# Patient Record
Sex: Male | Born: 1970 | Race: Black or African American | Hispanic: No | Marital: Single | Smoking: Current every day smoker
Health system: Southern US, Community
[De-identification: ages and names within clinical notes are randomized; demographics above are authoritative.]

## PROBLEM LIST (undated history)

## (undated) ENCOUNTER — Emergency Department (HOSPITAL_COMMUNITY): Payer: No Typology Code available for payment source | Source: Home / Self Care

## (undated) VITALS — BP 126/83 | HR 96 | Temp 98.8°F | Resp 16 | Ht 67.5 in | Wt 140.0 lb

## (undated) DIAGNOSIS — F4310 Post-traumatic stress disorder, unspecified: Secondary | ICD-10-CM

## (undated) DIAGNOSIS — G8929 Other chronic pain: Secondary | ICD-10-CM

## (undated) DIAGNOSIS — J189 Pneumonia, unspecified organism: Secondary | ICD-10-CM

## (undated) DIAGNOSIS — I10 Essential (primary) hypertension: Secondary | ICD-10-CM

## (undated) DIAGNOSIS — Z8711 Personal history of peptic ulcer disease: Secondary | ICD-10-CM

## (undated) DIAGNOSIS — K859 Acute pancreatitis without necrosis or infection, unspecified: Secondary | ICD-10-CM

## (undated) DIAGNOSIS — S72413A Displaced unspecified condyle fracture of lower end of unspecified femur, initial encounter for closed fracture: Secondary | ICD-10-CM

## (undated) DIAGNOSIS — F1020 Alcohol dependence, uncomplicated: Secondary | ICD-10-CM

## (undated) DIAGNOSIS — M545 Low back pain, unspecified: Secondary | ICD-10-CM

## (undated) DIAGNOSIS — E7800 Pure hypercholesterolemia, unspecified: Secondary | ICD-10-CM

## (undated) DIAGNOSIS — J42 Unspecified chronic bronchitis: Secondary | ICD-10-CM

## (undated) DIAGNOSIS — Z8719 Personal history of other diseases of the digestive system: Secondary | ICD-10-CM

## (undated) DIAGNOSIS — Z9289 Personal history of other medical treatment: Secondary | ICD-10-CM

## (undated) DIAGNOSIS — I456 Pre-excitation syndrome: Secondary | ICD-10-CM

## (undated) DIAGNOSIS — D649 Anemia, unspecified: Secondary | ICD-10-CM

## (undated) DIAGNOSIS — R569 Unspecified convulsions: Secondary | ICD-10-CM

## (undated) DIAGNOSIS — D573 Sickle-cell trait: Secondary | ICD-10-CM

## (undated) DIAGNOSIS — Z8489 Family history of other specified conditions: Secondary | ICD-10-CM

## (undated) DIAGNOSIS — F1210 Cannabis abuse, uncomplicated: Secondary | ICD-10-CM

## (undated) DIAGNOSIS — IMO0001 Reserved for inherently not codable concepts without codable children: Secondary | ICD-10-CM

## (undated) DIAGNOSIS — R0602 Shortness of breath: Secondary | ICD-10-CM

## (undated) DIAGNOSIS — R011 Cardiac murmur, unspecified: Secondary | ICD-10-CM

## (undated) DIAGNOSIS — F1410 Cocaine abuse, uncomplicated: Secondary | ICD-10-CM

## (undated) DIAGNOSIS — F329 Major depressive disorder, single episode, unspecified: Secondary | ICD-10-CM

## (undated) DIAGNOSIS — F319 Bipolar disorder, unspecified: Secondary | ICD-10-CM

## (undated) DIAGNOSIS — Z915 Personal history of self-harm: Secondary | ICD-10-CM

## (undated) DIAGNOSIS — K861 Other chronic pancreatitis: Secondary | ICD-10-CM

## (undated) DIAGNOSIS — G43909 Migraine, unspecified, not intractable, without status migrainosus: Secondary | ICD-10-CM

## (undated) DIAGNOSIS — F32A Depression, unspecified: Secondary | ICD-10-CM

## (undated) DIAGNOSIS — F419 Anxiety disorder, unspecified: Secondary | ICD-10-CM

## (undated) DIAGNOSIS — M1990 Unspecified osteoarthritis, unspecified site: Secondary | ICD-10-CM

## (undated) DIAGNOSIS — K219 Gastro-esophageal reflux disease without esophagitis: Secondary | ICD-10-CM

## (undated) HISTORY — PX: UMBILICAL HERNIA REPAIR: SHX196

## (undated) HISTORY — PX: UPPER GASTROINTESTINAL ENDOSCOPY: SHX188

## (undated) HISTORY — PX: EYE SURGERY: SHX253

## (undated) HISTORY — PX: HERNIA REPAIR: SHX51

## (undated) HISTORY — PX: CARDIAC CATHETERIZATION: SHX172

## (undated) HISTORY — PX: FRACTURE SURGERY: SHX138

## (undated) HISTORY — PX: FACIAL FRACTURE SURGERY: SHX1570

---

## 1989-01-07 HISTORY — PX: FACIAL FRACTURE SURGERY: SHX1570

## 1989-01-07 HISTORY — PX: EYE SURGERY: SHX253

## 1997-09-07 ENCOUNTER — Emergency Department (HOSPITAL_COMMUNITY): Admission: EM | Admit: 1997-09-07 | Discharge: 1997-09-07 | Payer: Self-pay | Admitting: Emergency Medicine

## 1998-08-18 ENCOUNTER — Encounter: Payer: Self-pay | Admitting: Emergency Medicine

## 1998-08-18 ENCOUNTER — Emergency Department (HOSPITAL_COMMUNITY): Admission: EM | Admit: 1998-08-18 | Discharge: 1998-08-18 | Payer: Self-pay | Admitting: Emergency Medicine

## 1998-11-13 ENCOUNTER — Emergency Department (HOSPITAL_COMMUNITY): Admission: EM | Admit: 1998-11-13 | Discharge: 1998-11-13 | Payer: Self-pay | Admitting: Emergency Medicine

## 1998-11-13 ENCOUNTER — Encounter: Payer: Self-pay | Admitting: Emergency Medicine

## 2000-03-30 ENCOUNTER — Encounter: Payer: Self-pay | Admitting: Emergency Medicine

## 2000-03-30 ENCOUNTER — Emergency Department (HOSPITAL_COMMUNITY): Admission: EM | Admit: 2000-03-30 | Discharge: 2000-03-31 | Payer: Self-pay | Admitting: Emergency Medicine

## 2000-03-31 ENCOUNTER — Encounter: Payer: Self-pay | Admitting: Emergency Medicine

## 2000-06-13 ENCOUNTER — Encounter: Admission: RE | Admit: 2000-06-13 | Discharge: 2000-06-20 | Payer: Self-pay | Admitting: Orthopedic Surgery

## 2000-09-15 ENCOUNTER — Encounter: Payer: Self-pay | Admitting: Emergency Medicine

## 2000-09-15 ENCOUNTER — Emergency Department (HOSPITAL_COMMUNITY): Admission: EM | Admit: 2000-09-15 | Discharge: 2000-09-15 | Payer: Self-pay | Admitting: Emergency Medicine

## 2000-11-03 ENCOUNTER — Emergency Department (HOSPITAL_COMMUNITY): Admission: EM | Admit: 2000-11-03 | Discharge: 2000-11-03 | Payer: Self-pay | Admitting: Emergency Medicine

## 2000-11-03 ENCOUNTER — Encounter: Payer: Self-pay | Admitting: Emergency Medicine

## 2001-12-19 ENCOUNTER — Emergency Department (HOSPITAL_COMMUNITY): Admission: EM | Admit: 2001-12-19 | Discharge: 2001-12-19 | Payer: Self-pay | Admitting: Emergency Medicine

## 2002-10-05 ENCOUNTER — Emergency Department (HOSPITAL_COMMUNITY): Admission: EM | Admit: 2002-10-05 | Discharge: 2002-10-05 | Payer: Self-pay | Admitting: *Deleted

## 2003-05-07 ENCOUNTER — Emergency Department (HOSPITAL_COMMUNITY): Admission: EM | Admit: 2003-05-07 | Discharge: 2003-05-07 | Payer: Self-pay | Admitting: Emergency Medicine

## 2003-08-25 ENCOUNTER — Emergency Department (HOSPITAL_COMMUNITY): Admission: EM | Admit: 2003-08-25 | Discharge: 2003-08-25 | Payer: Self-pay | Admitting: *Deleted

## 2003-10-19 ENCOUNTER — Emergency Department (HOSPITAL_COMMUNITY): Admission: EM | Admit: 2003-10-19 | Discharge: 2003-10-19 | Payer: Self-pay | Admitting: *Deleted

## 2004-03-22 ENCOUNTER — Emergency Department (HOSPITAL_COMMUNITY): Admission: EM | Admit: 2004-03-22 | Discharge: 2004-03-22 | Payer: Self-pay | Admitting: Emergency Medicine

## 2004-03-27 ENCOUNTER — Emergency Department (HOSPITAL_COMMUNITY): Admission: EM | Admit: 2004-03-27 | Discharge: 2004-03-27 | Payer: Self-pay | Admitting: Emergency Medicine

## 2004-10-12 ENCOUNTER — Emergency Department (HOSPITAL_COMMUNITY): Admission: EM | Admit: 2004-10-12 | Discharge: 2004-10-12 | Payer: Self-pay | Admitting: Emergency Medicine

## 2005-02-01 ENCOUNTER — Encounter (INDEPENDENT_AMBULATORY_CARE_PROVIDER_SITE_OTHER): Payer: Self-pay | Admitting: Internal Medicine

## 2005-02-14 ENCOUNTER — Ambulatory Visit: Payer: Self-pay | Admitting: *Deleted

## 2005-05-24 ENCOUNTER — Emergency Department (HOSPITAL_COMMUNITY): Admission: EM | Admit: 2005-05-24 | Discharge: 2005-05-24 | Payer: Self-pay | Admitting: Emergency Medicine

## 2006-02-02 ENCOUNTER — Emergency Department (HOSPITAL_COMMUNITY): Admission: EM | Admit: 2006-02-02 | Discharge: 2006-02-03 | Payer: Self-pay | Admitting: Emergency Medicine

## 2006-02-03 ENCOUNTER — Emergency Department (HOSPITAL_COMMUNITY): Admission: EM | Admit: 2006-02-03 | Discharge: 2006-02-03 | Payer: Self-pay | Admitting: Family Medicine

## 2006-02-12 ENCOUNTER — Emergency Department (HOSPITAL_COMMUNITY): Admission: EM | Admit: 2006-02-12 | Discharge: 2006-02-12 | Payer: Self-pay | Admitting: Family Medicine

## 2006-02-24 ENCOUNTER — Emergency Department (HOSPITAL_COMMUNITY): Admission: EM | Admit: 2006-02-24 | Discharge: 2006-02-24 | Payer: Self-pay | Admitting: Family Medicine

## 2006-05-15 ENCOUNTER — Emergency Department (HOSPITAL_COMMUNITY): Admission: EM | Admit: 2006-05-15 | Discharge: 2006-05-15 | Payer: Self-pay | Admitting: Family Medicine

## 2006-06-07 ENCOUNTER — Emergency Department (HOSPITAL_COMMUNITY): Admission: EM | Admit: 2006-06-07 | Discharge: 2006-06-07 | Payer: Self-pay | Admitting: Family Medicine

## 2006-07-10 ENCOUNTER — Emergency Department (HOSPITAL_COMMUNITY): Admission: EM | Admit: 2006-07-10 | Discharge: 2006-07-10 | Payer: Self-pay | Admitting: Family Medicine

## 2006-07-26 ENCOUNTER — Emergency Department (HOSPITAL_COMMUNITY): Admission: EM | Admit: 2006-07-26 | Discharge: 2006-07-26 | Payer: Self-pay | Admitting: Emergency Medicine

## 2006-07-27 ENCOUNTER — Emergency Department (HOSPITAL_COMMUNITY): Admission: EM | Admit: 2006-07-27 | Discharge: 2006-07-27 | Payer: Self-pay | Admitting: Family Medicine

## 2006-07-27 ENCOUNTER — Emergency Department (HOSPITAL_COMMUNITY): Admission: EM | Admit: 2006-07-27 | Discharge: 2006-07-27 | Payer: Self-pay | Admitting: Emergency Medicine

## 2006-08-07 ENCOUNTER — Emergency Department (HOSPITAL_COMMUNITY): Admission: EM | Admit: 2006-08-07 | Discharge: 2006-08-07 | Payer: Self-pay | Admitting: Family Medicine

## 2006-11-08 ENCOUNTER — Emergency Department (HOSPITAL_COMMUNITY): Admission: EM | Admit: 2006-11-08 | Discharge: 2006-11-08 | Payer: Self-pay | Admitting: Emergency Medicine

## 2007-02-20 ENCOUNTER — Encounter (INDEPENDENT_AMBULATORY_CARE_PROVIDER_SITE_OTHER): Payer: Self-pay | Admitting: Internal Medicine

## 2007-03-13 ENCOUNTER — Encounter (INDEPENDENT_AMBULATORY_CARE_PROVIDER_SITE_OTHER): Payer: Self-pay | Admitting: Internal Medicine

## 2007-03-13 ENCOUNTER — Telehealth (INDEPENDENT_AMBULATORY_CARE_PROVIDER_SITE_OTHER): Payer: Self-pay | Admitting: Internal Medicine

## 2007-03-23 ENCOUNTER — Ambulatory Visit: Payer: Self-pay | Admitting: Internal Medicine

## 2007-03-23 DIAGNOSIS — F1910 Other psychoactive substance abuse, uncomplicated: Secondary | ICD-10-CM

## 2007-03-23 DIAGNOSIS — F1010 Alcohol abuse, uncomplicated: Secondary | ICD-10-CM | POA: Insufficient documentation

## 2007-03-23 DIAGNOSIS — Z720 Tobacco use: Secondary | ICD-10-CM | POA: Insufficient documentation

## 2007-03-23 DIAGNOSIS — F17200 Nicotine dependence, unspecified, uncomplicated: Secondary | ICD-10-CM

## 2007-03-23 LAB — CONVERTED CEMR LAB
Bilirubin Urine: NEGATIVE
Glucose, Urine, Semiquant: NEGATIVE
Ketones, urine, test strip: NEGATIVE
Specific Gravity, Urine: 1.01
Urobilinogen, UA: NEGATIVE
pH: 8

## 2007-03-26 ENCOUNTER — Encounter (INDEPENDENT_AMBULATORY_CARE_PROVIDER_SITE_OTHER): Payer: Self-pay | Admitting: Internal Medicine

## 2007-03-26 LAB — CONVERTED CEMR LAB
Albumin: 4 g/dL (ref 3.5–5.2)
Alkaline Phosphatase: 73 units/L (ref 39–117)
BUN: 9 mg/dL (ref 6–23)
Calcium: 8.8 mg/dL (ref 8.4–10.5)
Chloride: 104 meq/L (ref 96–112)
Eosinophils Absolute: 0.2 10*3/uL (ref 0.2–0.7)
Glucose, Bld: 70 mg/dL (ref 70–99)
HDL: 75 mg/dL (ref >39)
LDL Cholesterol: 30 mg/dL (ref 0–99)
Lymphocytes Relative: 33 % (ref 12–46)
Lymphs Abs: 3.8 10*3/uL (ref 0.7–4.0)
Monocytes Relative: 7 % (ref 3–12)
Neutro Abs: 6.8 10*3/uL (ref 1.7–7.7)
Neutrophils Relative %: 59 % (ref 43–77)
Platelets: 251 10*3/uL (ref 150–400)
Potassium: 4.1 meq/L (ref 3.5–5.3)
RBC: 3.97 M/uL — ABNORMAL LOW (ref 4.22–5.81)
Sodium: 141 meq/L (ref 135–145)
Total Protein: 6.6 g/dL (ref 6.0–8.3)
Triglycerides: 319 mg/dL — ABNORMAL HIGH (ref <150)
WBC: 11.6 10*3/uL — ABNORMAL HIGH (ref 4.0–10.5)

## 2007-03-28 ENCOUNTER — Encounter (INDEPENDENT_AMBULATORY_CARE_PROVIDER_SITE_OTHER): Payer: Self-pay | Admitting: Internal Medicine

## 2007-03-28 LAB — CONVERTED CEMR LAB
Hepatitis B Surface Ag: NEGATIVE
Iron: 23 ug/dL — ABNORMAL LOW (ref 42–165)
Saturation Ratios: 6 % — ABNORMAL LOW (ref 20–55)
TIBC: 372 ug/dL (ref 215–435)

## 2007-04-04 IMAGING — CT CT HEAD W/O CM
1 of 2 series · 13 of 30 positions shown, 17 images · non-contrast
Comparison: None.
 HEAD CT WITHOUT CONTRAST:

CLINICAL DATA: Left-sided headache for two days.
TECHNIQUE: 5mm collimated images were obtained from the base of the skull through the vertex according to standard protocol without contrast.
 There is no evidence of intracranial hemorrhage, brain edema, or mass effect.  No other intra-axial abnormalities are seen, and the ventricles are within normal limits.  No abnormal extra-axial fluid collections or masses are identified.  No skull abnormalities are noted.

[Series 2: brain · axial · 0.47mm/px · z∈[+151,+282]mm · 13 of 32 slices shown, 17 images]
[im 3/32  brain]
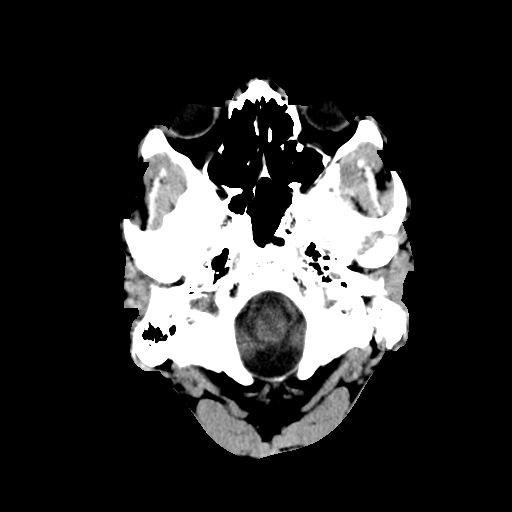
[im 3/32  bone]
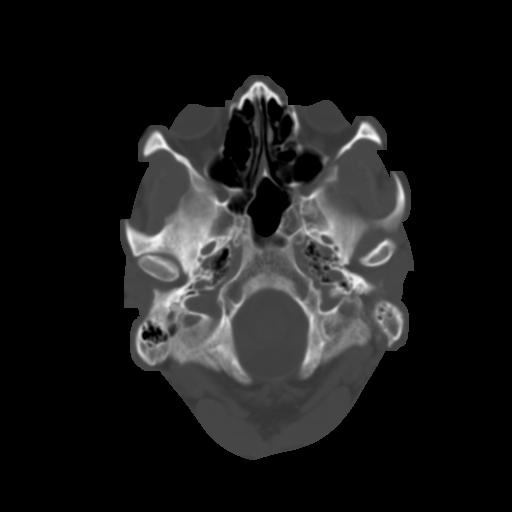
[im 5/32  brain]
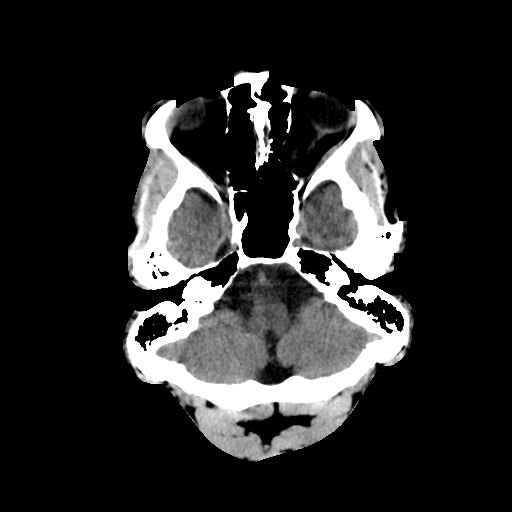
[im 7/32  brain]
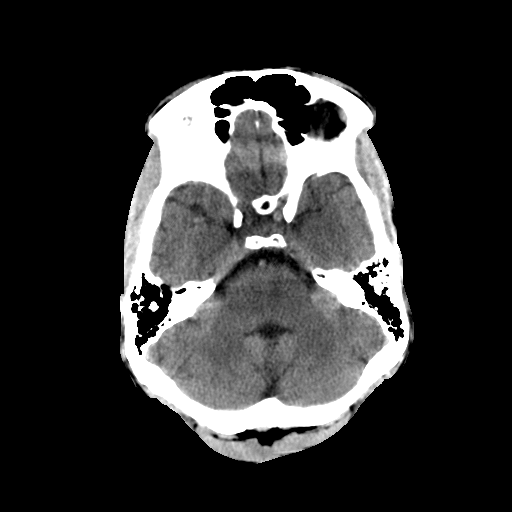
[im 9/32  brain]
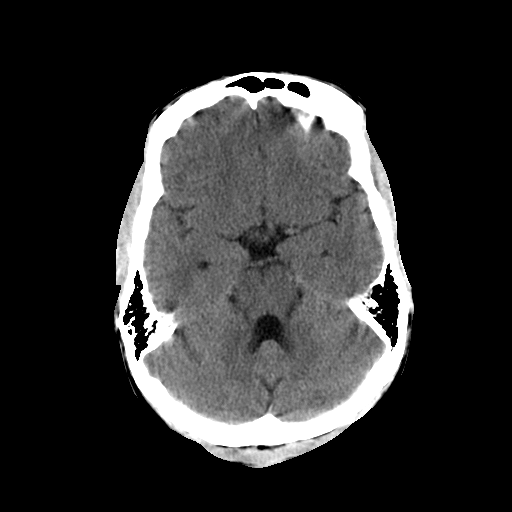
[im 12/32  brain]
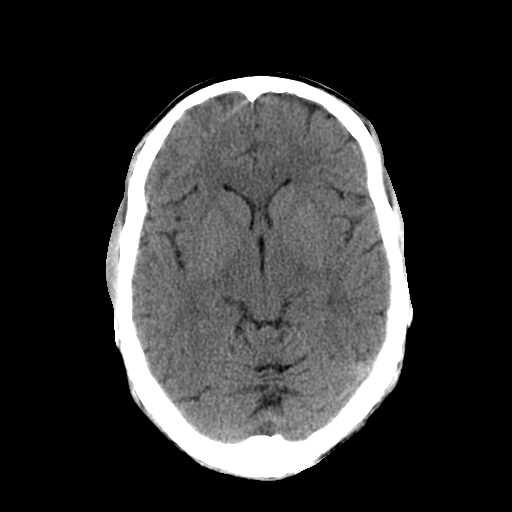
[im 12/32  bone]
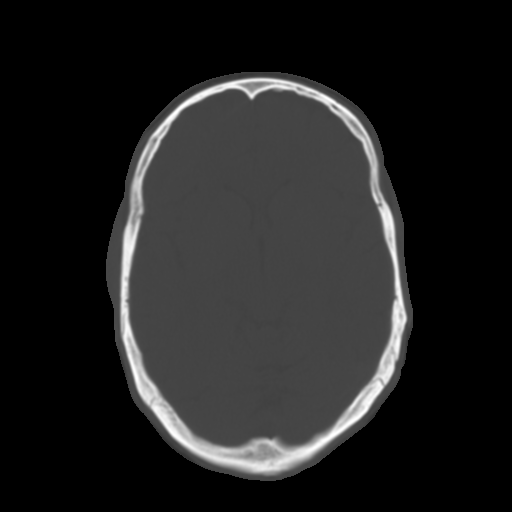
[im 14/32  brain]
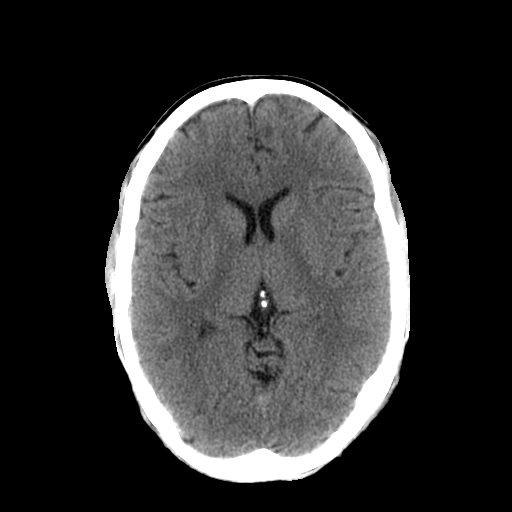
[im 16/32  brain]
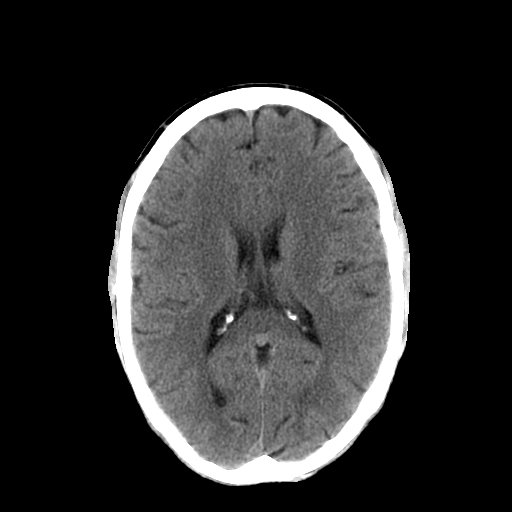
[im 18/32  brain]
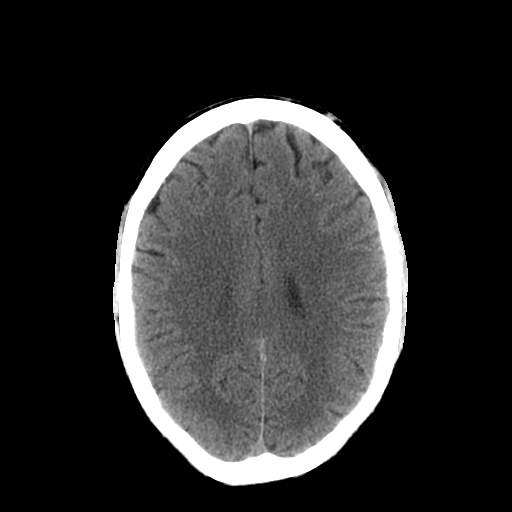
[im 20/32  brain]
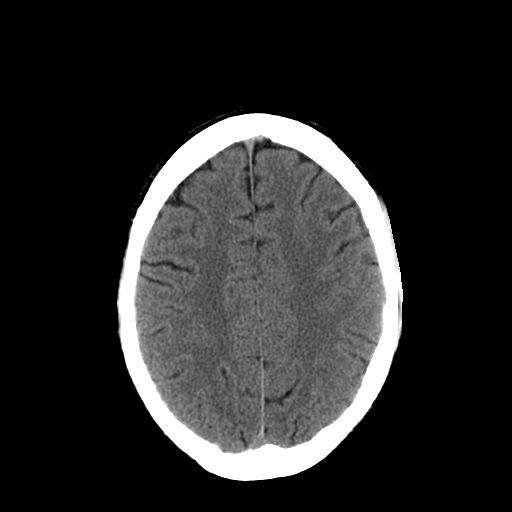
[im 20/32  bone]
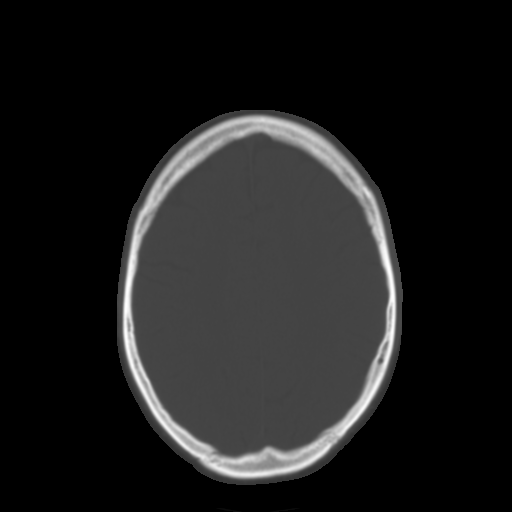
[im 23/32  brain]
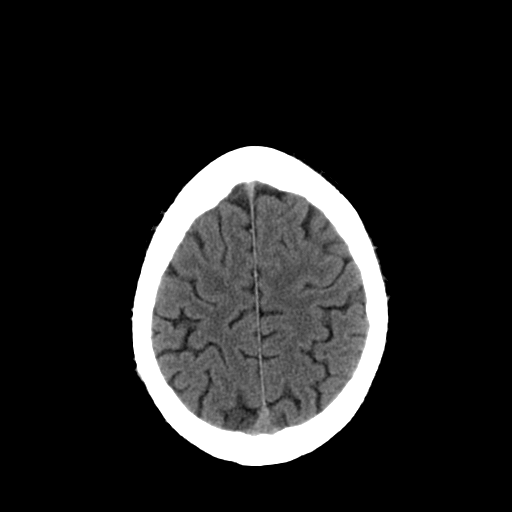
[im 25/32  brain]
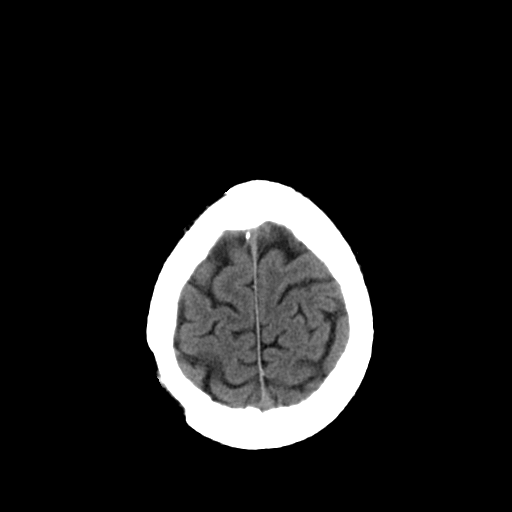
[im 27/32  brain]
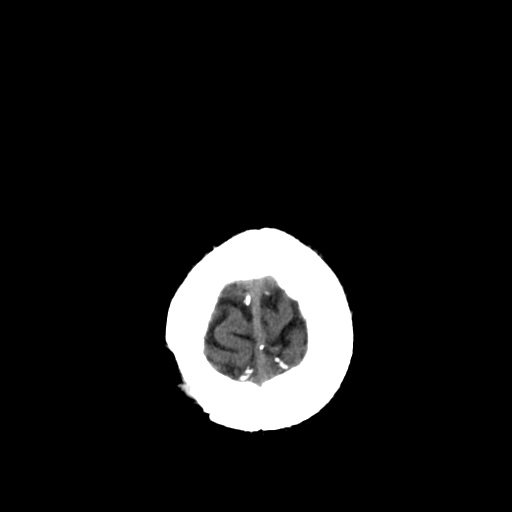
[im 29/32  brain]
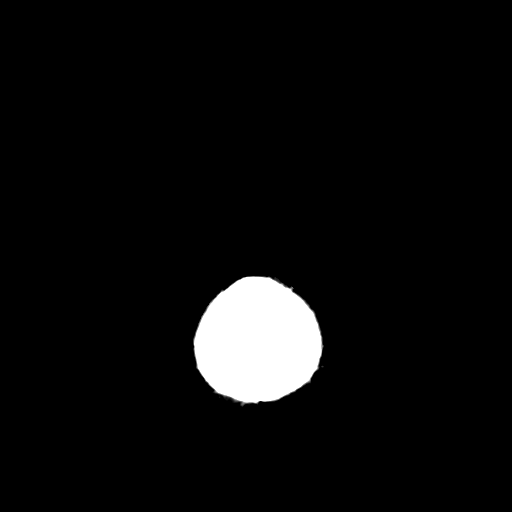
[im 29/32  bone]
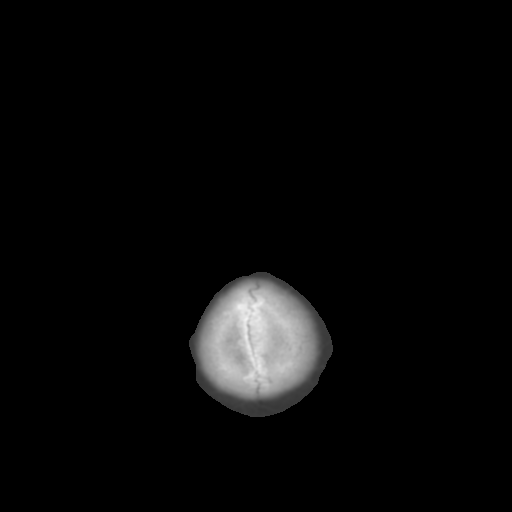

[13 of 30 positions shown; findings below may reference images not displayed]

IMPRESSION: Negative non-contrast head CT.

## 2007-04-24 ENCOUNTER — Ambulatory Visit: Payer: Self-pay | Admitting: Internal Medicine

## 2007-06-25 ENCOUNTER — Telehealth (INDEPENDENT_AMBULATORY_CARE_PROVIDER_SITE_OTHER): Payer: Self-pay | Admitting: Internal Medicine

## 2007-07-27 ENCOUNTER — Ambulatory Visit: Payer: Self-pay | Admitting: Internal Medicine

## 2007-09-04 ENCOUNTER — Emergency Department (HOSPITAL_COMMUNITY): Admission: EM | Admit: 2007-09-04 | Discharge: 2007-09-04 | Payer: Self-pay | Admitting: Emergency Medicine

## 2008-02-02 ENCOUNTER — Observation Stay (HOSPITAL_COMMUNITY): Admission: EM | Admit: 2008-02-02 | Discharge: 2008-02-04 | Payer: Self-pay | Admitting: Emergency Medicine

## 2008-03-06 ENCOUNTER — Ambulatory Visit: Payer: Self-pay | Admitting: Internal Medicine

## 2008-05-21 ENCOUNTER — Telehealth (INDEPENDENT_AMBULATORY_CARE_PROVIDER_SITE_OTHER): Payer: Self-pay | Admitting: Internal Medicine
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
## 2008-07-30 ENCOUNTER — Emergency Department (HOSPITAL_BASED_OUTPATIENT_CLINIC_OR_DEPARTMENT_OTHER): Admission: EM | Admit: 2008-07-30 | Discharge: 2008-07-30 | Payer: Self-pay | Admitting: Emergency Medicine

## 2008-08-13 ENCOUNTER — Emergency Department (HOSPITAL_BASED_OUTPATIENT_CLINIC_OR_DEPARTMENT_OTHER): Admission: EM | Admit: 2008-08-13 | Discharge: 2008-08-13 | Payer: Self-pay | Admitting: Emergency Medicine

## 2008-08-13 ENCOUNTER — Ambulatory Visit: Payer: Self-pay | Admitting: Diagnostic Radiology

## 2008-08-14 ENCOUNTER — Ambulatory Visit: Payer: Self-pay | Admitting: Diagnostic Radiology

## 2008-08-14 ENCOUNTER — Emergency Department (HOSPITAL_BASED_OUTPATIENT_CLINIC_OR_DEPARTMENT_OTHER): Admission: EM | Admit: 2008-08-14 | Discharge: 2008-08-14 | Payer: Self-pay | Admitting: Emergency Medicine

## 2008-11-09 ENCOUNTER — Emergency Department (HOSPITAL_COMMUNITY): Admission: EM | Admit: 2008-11-09 | Discharge: 2008-11-09 | Payer: Self-pay | Admitting: Emergency Medicine

## 2008-12-02 ENCOUNTER — Emergency Department (HOSPITAL_COMMUNITY): Admission: EM | Admit: 2008-12-02 | Discharge: 2008-12-02 | Payer: Self-pay | Admitting: Family Medicine

## 2009-03-03 ENCOUNTER — Emergency Department (HOSPITAL_COMMUNITY): Admission: EM | Admit: 2009-03-03 | Discharge: 2009-03-04 | Payer: Self-pay | Admitting: Emergency Medicine

## 2009-03-07 ENCOUNTER — Emergency Department (HOSPITAL_COMMUNITY): Admission: EM | Admit: 2009-03-07 | Discharge: 2009-03-07 | Payer: Self-pay | Admitting: Emergency Medicine

## 2009-03-12 ENCOUNTER — Emergency Department (HOSPITAL_COMMUNITY): Admission: EM | Admit: 2009-03-12 | Discharge: 2009-03-12 | Payer: Self-pay | Admitting: Emergency Medicine

## 2009-05-04 ENCOUNTER — Inpatient Hospital Stay (HOSPITAL_COMMUNITY): Admission: EM | Admit: 2009-05-04 | Discharge: 2009-05-10 | Payer: Self-pay | Admitting: Emergency Medicine

## 2009-05-13 ENCOUNTER — Emergency Department (HOSPITAL_COMMUNITY): Admission: EM | Admit: 2009-05-13 | Discharge: 2009-05-13 | Payer: Self-pay | Admitting: Emergency Medicine

## 2009-05-20 ENCOUNTER — Emergency Department (HOSPITAL_COMMUNITY): Admission: EM | Admit: 2009-05-20 | Discharge: 2009-05-20 | Payer: Self-pay | Admitting: Emergency Medicine

## 2009-05-28 ENCOUNTER — Ambulatory Visit: Payer: Self-pay | Admitting: Internal Medicine

## 2009-05-28 DIAGNOSIS — F329 Major depressive disorder, single episode, unspecified: Secondary | ICD-10-CM

## 2009-06-05 ENCOUNTER — Telehealth (INDEPENDENT_AMBULATORY_CARE_PROVIDER_SITE_OTHER): Payer: Self-pay | Admitting: Internal Medicine

## 2009-07-01 ENCOUNTER — Emergency Department (HOSPITAL_COMMUNITY): Admission: EM | Admit: 2009-07-01 | Discharge: 2009-07-01 | Payer: Self-pay | Admitting: Emergency Medicine

## 2009-07-09 ENCOUNTER — Emergency Department (HOSPITAL_COMMUNITY): Admission: EM | Admit: 2009-07-09 | Discharge: 2009-07-09 | Payer: Self-pay | Admitting: Emergency Medicine

## 2009-10-15 ENCOUNTER — Emergency Department (HOSPITAL_COMMUNITY): Admission: EM | Admit: 2009-10-15 | Discharge: 2009-10-15 | Payer: Self-pay | Admitting: Emergency Medicine

## 2010-04-22 ENCOUNTER — Emergency Department (HOSPITAL_COMMUNITY)
Admission: EM | Admit: 2010-04-22 | Discharge: 2010-04-22 | Payer: Self-pay | Source: Home / Self Care | Admitting: Emergency Medicine

## 2010-04-24 ENCOUNTER — Emergency Department (HOSPITAL_COMMUNITY)
Admission: EM | Admit: 2010-04-24 | Discharge: 2010-04-24 | Payer: Self-pay | Source: Home / Self Care | Admitting: Emergency Medicine

## 2010-04-24 LAB — CONVERTED CEMR LAB
BUN: 11 mg/dL
Glucose, Bld: 91 mg/dL
Sodium: 142 meq/L

## 2010-04-25 ENCOUNTER — Emergency Department (HOSPITAL_COMMUNITY)
Admission: EM | Admit: 2010-04-25 | Discharge: 2010-04-25 | Payer: Self-pay | Source: Home / Self Care | Admitting: Emergency Medicine

## 2010-04-26 ENCOUNTER — Emergency Department (HOSPITAL_COMMUNITY)
Admission: EM | Admit: 2010-04-26 | Discharge: 2010-04-26 | Payer: Self-pay | Source: Home / Self Care | Admitting: Emergency Medicine

## 2010-04-30 ENCOUNTER — Ambulatory Visit: Payer: Self-pay | Admitting: Nurse Practitioner

## 2010-04-30 ENCOUNTER — Encounter (INDEPENDENT_AMBULATORY_CARE_PROVIDER_SITE_OTHER): Payer: Self-pay | Admitting: Internal Medicine

## 2010-04-30 LAB — CONVERTED CEMR LAB
Bilirubin Urine: NEGATIVE
Urobilinogen, UA: 0.2

## 2010-05-05 ENCOUNTER — Telehealth (INDEPENDENT_AMBULATORY_CARE_PROVIDER_SITE_OTHER): Payer: Self-pay | Admitting: Nurse Practitioner

## 2010-05-05 ENCOUNTER — Ambulatory Visit (HOSPITAL_COMMUNITY)
Admission: RE | Admit: 2010-05-05 | Discharge: 2010-05-05 | Payer: Self-pay | Source: Home / Self Care | Attending: Internal Medicine | Admitting: Internal Medicine

## 2010-05-07 ENCOUNTER — Telehealth (INDEPENDENT_AMBULATORY_CARE_PROVIDER_SITE_OTHER): Payer: Self-pay | Admitting: Internal Medicine

## 2010-05-20 ENCOUNTER — Emergency Department (HOSPITAL_COMMUNITY)
Admission: EM | Admit: 2010-05-20 | Discharge: 2010-05-20 | Payer: Self-pay | Source: Home / Self Care | Admitting: Emergency Medicine

## 2010-05-24 LAB — CBC
HCT: 40.3 % (ref 39.0–52.0)
Hemoglobin: 13.8 g/dL (ref 13.0–17.0)
MCH: 32.6 pg (ref 26.0–34.0)
MCHC: 34.2 g/dL (ref 30.0–36.0)
MCV: 95.3 fL (ref 78.0–100.0)
Platelets: 200 K/uL (ref 150–400)
RBC: 4.23 MIL/uL (ref 4.22–5.81)
RDW: 12 % (ref 11.5–15.5)
WBC: 11.1 K/uL — ABNORMAL HIGH (ref 4.0–10.5)

## 2010-05-24 LAB — DIFFERENTIAL
Basophils Absolute: 0 K/uL (ref 0.0–0.1)
Basophils Relative: 0 % (ref 0–1)
Eosinophils Absolute: 0.4 K/uL (ref 0.0–0.7)
Eosinophils Relative: 4 % (ref 0–5)
Lymphocytes Relative: 40 % (ref 12–46)
Lymphs Abs: 4.5 K/uL — ABNORMAL HIGH (ref 0.7–4.0)
Monocytes Absolute: 1.2 K/uL — ABNORMAL HIGH (ref 0.1–1.0)
Monocytes Relative: 10 % (ref 3–12)
Neutro Abs: 5.1 K/uL (ref 1.7–7.7)
Neutrophils Relative %: 46 % (ref 43–77)

## 2010-05-24 LAB — COMPREHENSIVE METABOLIC PANEL WITH GFR
ALT: 105 U/L — ABNORMAL HIGH (ref 0–53)
AST: 159 U/L — ABNORMAL HIGH (ref 0–37)
Albumin: 4.2 g/dL (ref 3.5–5.2)
Alkaline Phosphatase: 84 U/L (ref 39–117)
BUN: 10 mg/dL (ref 6–23)
CO2: 23 meq/L (ref 19–32)
Calcium: 9.8 mg/dL (ref 8.4–10.5)
Chloride: 101 meq/L (ref 96–112)
Creatinine, Ser: 0.89 mg/dL (ref 0.4–1.5)
GFR calc non Af Amer: >60 mL/min
Glucose, Bld: 97 mg/dL (ref 70–99)
Potassium: 4.2 meq/L (ref 3.5–5.1)
Sodium: 135 meq/L (ref 135–145)
Total Bilirubin: 1.2 mg/dL (ref 0.3–1.2)
Total Protein: 7.2 g/dL (ref 6.0–8.3)

## 2010-05-24 LAB — LIPASE, BLOOD: Lipase: 40 U/L (ref 11–59)

## 2010-05-24 LAB — ETHANOL: Alcohol, Ethyl (B): <5 mg/dL (ref 0–10)

## 2010-05-31 ENCOUNTER — Emergency Department (HOSPITAL_COMMUNITY)
Admission: EM | Admit: 2010-05-31 | Discharge: 2010-05-31 | Payer: Self-pay | Source: Home / Self Care | Admitting: Emergency Medicine

## 2010-06-01 LAB — DIFFERENTIAL
Basophils Relative: 0 % (ref 0–1)
Eosinophils Absolute: 0.2 10*3/uL (ref 0.0–0.7)
Lymphs Abs: 2.5 10*3/uL (ref 0.7–4.0)
Monocytes Absolute: 0.7 10*3/uL (ref 0.1–1.0)
Monocytes Relative: 11 % (ref 3–12)
Neutro Abs: 3.3 10*3/uL (ref 1.7–7.7)

## 2010-06-01 LAB — URINALYSIS, ROUTINE W REFLEX MICROSCOPIC
Bilirubin Urine: NEGATIVE
Hgb urine dipstick: NEGATIVE
Protein, ur: NEGATIVE mg/dL
Urine Glucose, Fasting: NEGATIVE mg/dL
Urobilinogen, UA: 0.2 mg/dL (ref 0.0–1.0)

## 2010-06-01 LAB — COMPREHENSIVE METABOLIC PANEL
AST: 96 U/L — ABNORMAL HIGH (ref 0–37)
BUN: 10 mg/dL (ref 6–23)
CO2: 26 mEq/L (ref 19–32)
Calcium: 9.9 mg/dL (ref 8.4–10.5)
Creatinine, Ser: 0.74 mg/dL (ref 0.4–1.5)
GFR calc Af Amer: >60 mL/min (ref >60)
GFR calc non Af Amer: >60 mL/min (ref >60)
Total Bilirubin: 0.7 mg/dL (ref 0.3–1.2)

## 2010-06-01 LAB — CBC
Hemoglobin: 13.1 g/dL (ref 13.0–17.0)
MCH: 31.2 pg (ref 26.0–34.0)
MCHC: 32.4 g/dL (ref 30.0–36.0)
MCV: 96.2 fL (ref 78.0–100.0)

## 2010-06-01 LAB — LIPASE, BLOOD: Lipase: 38 U/L (ref 11–59)

## 2010-06-03 ENCOUNTER — Ambulatory Visit: Admit: 2010-06-03 | Payer: Self-pay | Admitting: Internal Medicine

## 2010-06-06 ENCOUNTER — Emergency Department (HOSPITAL_COMMUNITY)
Admission: EM | Admit: 2010-06-06 | Discharge: 2010-06-06 | Payer: Self-pay | Source: Home / Self Care | Admitting: Emergency Medicine

## 2010-06-06 LAB — URINALYSIS, ROUTINE W REFLEX MICROSCOPIC
Bilirubin Urine: NEGATIVE
Nitrite: NEGATIVE
Specific Gravity, Urine: 1.029 (ref 1.005–1.030)
Urobilinogen, UA: 0.2 mg/dL (ref 0.0–1.0)

## 2010-06-06 LAB — COMPREHENSIVE METABOLIC PANEL
ALT: 70 U/L — ABNORMAL HIGH (ref 0–53)
BUN: 8 mg/dL (ref 6–23)
CO2: 28 mEq/L (ref 19–32)
Calcium: 9.6 mg/dL (ref 8.4–10.5)
Creatinine, Ser: 0.81 mg/dL (ref 0.4–1.5)
GFR calc non Af Amer: >60 mL/min (ref >60)
Glucose, Bld: 79 mg/dL (ref 70–99)

## 2010-06-06 LAB — CBC
HCT: 36.3 % — ABNORMAL LOW (ref 39.0–52.0)
Hemoglobin: 12.3 g/dL — ABNORMAL LOW (ref 13.0–17.0)
MCH: 32.3 pg (ref 26.0–34.0)
MCHC: 33.9 g/dL (ref 30.0–36.0)
MCV: 95.3 fL (ref 78.0–100.0)
RDW: 12.3 % (ref 11.5–15.5)

## 2010-06-06 LAB — LIPASE, BLOOD: Lipase: 27 U/L (ref 11–59)

## 2010-06-06 LAB — DIFFERENTIAL
Eosinophils Relative: 4 % (ref 0–5)
Lymphocytes Relative: 46 % (ref 12–46)
Monocytes Absolute: 0.6 10*3/uL (ref 0.1–1.0)
Monocytes Relative: 8 % (ref 3–12)
Neutro Abs: 3.4 10*3/uL (ref 1.7–7.7)

## 2010-06-10 NOTE — Progress Notes (Signed)
Summary: TEST RESULTS  Phone Note Call from Patient   Summary of Call: PLEASE CALL PATIENT NEED TEST RESULT [phone] Initial call taken by: Domenic Polite,  May 07, 2010 8:57 AM  Follow-up for Phone Call        LM on [phone]... Hale Drone CMA  May 07, 2010 4:50 PM

## 2010-06-10 NOTE — Progress Notes (Signed)
Summary: Ultrasound results  Phone Note Outgoing Call   Summary of Call: notify pt that abdominal ultrasound is normal Initial call taken by: Lehman Prom FNP,  May 05, 2010 4:28 PM  Follow-up for Phone Call        LM on [phone]... Hale Drone CMA  May 07, 2010 4:51 PM   Pt. called... notified him of the above info.... Hale Drone CMA  May 11, 2010 12:45 PM

## 2010-06-10 NOTE — Assessment & Plan Note (Signed)
Summary: PRANCRITITS////KT   Vital Signs:  Patient profile:   40 year old male Weight:      140.8 pounds BMI:     21.49 Temp:     98.0 degrees F oral Pulse rate:   93 / minute Pulse rhythm:   regular Resp:     20 per minute BP sitting:   118 / 76  (left arm) Cuff size:   regular  Vitals Entered By: Mikey College CMA (May 28, 2009 10:45 AM) CC: pt states here for f/u hospital Pain Assessment Patient in pain? yes     Location: stomach Intensity: 8  Does patient need assistance? Functional Status Self care Ambulation Normal Comments lisinopril 10mg  1 day metropolol 25mg  twice day percocet pain phenergan gemfribozil   CC:  pt states here for f/u hospital.  History of Present Illness: Pt. has not been seen since 2009  1.  Pancreatitis:  Hospitalized 2 days after Christmas with recurrent acute episode.  No pseudocyst or other abnormality noted on CT of abdomen.  Pt. admits to drinking beer again prior to this episode.  Pt. was initiated on Gemfibrozil for a triglyceride of 181 while in hospital.  Discussed this was unlikely to be cause of pancreatitis. Did go through 6 months of inpatient rehab starting in March [facility].  Has managed to stay off cocaine, but not alcohol.  Does have a sponsor, [employer].  Has had a resurgence in pain and nausea since discharge--needs something for those.  Discussed pain meds would be short term--no long term pain meds.  2.Depression:  Missed appt. with [facility] on day started working at [employer].  Did call and try to cancel, but he states he was told he would not be able to return.  Since then, has been able to get reinstated--January 24th.  We filled psych meds.  Pt. was not aware and has not picked up.  3.  Hypertension:  Diagnosed in Rehab--but pt. states he was not taking anything by time of discharge.  Allergies: No Known Drug Allergies  Physical Exam  General:  NAD Lungs:  Scattered exp wheeze, good air  movement Heart:  Normal rate and regular rhythm. S1 and S2 normal without gallop, murmur, click, rub or other extra sounds.  Radial pulses normal and equal Abdomen:  Bowel sounds positive,abdomen soft and non-tender without masses, organomegaly or hernias noted.   Impression & Recommendations:  Problem # 1:  DEPRESSION (ICD-311) Part of problem with alcohol use His updated medication list for this problem includes:    Fluoxetine Hcl 10 Mg Caps (Fluoxetine hcl) .[name]... 1 tab by mouth every am.  Problem # 2:  PANCREATITIS, ALCOHOLIC (ICD-577.0) Short term pain control only. Encouraged to stop drinking--to use his counselors through [employer] as well.  Problem # 3:  ASTHMA (ICD-493.90) Back on meds His updated medication list for this problem includes:    Albuterol 90 Mcg/act Aers (Albuterol)    Advair Diskus 100-50 Mcg/dose Misc (Fluticasone-salmeterol) .[name]... 1 inhalation two times a day  Problem # 4:  ALLERGIC RHINITIS (ICD-477.9) Back on meds His updated medication list for this problem includes:    Fexofenadine Hcl 180 Mg Tabs (Fexofenadine hcl) .[name]... 1 tab by mouth daily    Flonase 50 Mcg/act Susp (Fluticasone propionate) .[name]... 2 sprays each nostril daily    Promethazine Hcl 25 Mg Tabs (Promethazine hcl) .[name]... 1 tab by mouth every 6 hours as needed for nausea.  Problem # 5:  HYPERTENSION (ICD-401.9)  His updated  medication list for this problem includes:    Lisinopril 10 Mg Tabs (Lisinopril) .[name]... 1 tab by mouth daily  Complete Medication List: 1)  Seroquel 25 Mg Tabs (Quetiapine fumarate) .[name].. 1 tab by mouth qhs 2)  Fluoxetine Hcl 10 Mg Caps (Fluoxetine hcl) .[name].. 1 tab by mouth every am. 3)  Albuterol 90 Mcg/act Aers (Albuterol) 4)  Advair Diskus 100-50 Mcg/dose Misc (Fluticasone-salmeterol) .[name].. 1 inhalation two times a day 5)  Fexofenadine Hcl 180 Mg Tabs (Fexofenadine hcl) .[name].. 1 tab by mouth daily 6)  Flonase 50 Mcg/act Susp (Fluticasone propionate) .... 2 sprays each nostril  daily 7)  Protonix 40 Mg Tbec (Pantoprazole sodium) .[name].. 1 tab by mouth daily 8)  Lisinopril 10 Mg Tabs (Lisinopril) .[name].. 1 tab by mouth daily 9)  Promethazine Hcl 25 Mg Tabs (Promethazine hcl) .[name].. 1 tab by mouth every 6 hours as needed for nausea. 10)  Oxycodone-acetaminophen 5-500 Mg Caps (Oxycodone-acetaminophen) .[name].. 1 cap by mouth every 6 hours as needed for pain  Patient Instructions: 1)  Referral to [facility]--cocaine and alcohol abuse, depression 2)  Follow up with Dr. Delrae Alfred in 3 months   Asthma, htn, pancreatitis 3)  Stop Gemfibrozil Prescriptions: OXYCODONE-ACETAMINOPHEN 5-500 MG CAPS (OXYCODONE-ACETAMINOPHEN) 1 cap by mouth every 6 hours as needed for pain  #20 x 0   Entered and Authorized by:   Julieanne Manson MD   Signed by:   Julieanne Manson MD on 05/28/2009   Method used:   Print then Give to Patient   RxID:   [id number] FLONASE 50 MCG/ACT  SUSP (FLUTICASONE PROPIONATE) 2 sprays each nostril daily  #1 x 6   Entered and Authorized by:   Julieanne Manson MD   Signed by:   Julieanne Manson MD on 05/28/2009   Method used:   Faxed to ...       [facility] (retail)       [address] [place], [place]  [address]       Ph: [phone]       Fax: [phone]   RxID:   [id number] FEXOFENADINE HCL 180 MG  TABS (FEXOFENADINE HCL) 1 tab by mouth daily  #30 x 6   Entered and Authorized by:   Julieanne Manson MD   Signed by:   Julieanne Manson MD on 05/28/2009   Method used:   Faxed to ...       [facility] (retail)       [address] [place], [place]  [address]       Ph: [phone]       Fax: [phone]   RxID:   [id number] ADVAIR DISKUS 100-50 MCG/DOSE  MISC (FLUTICASONE-SALMETEROL) 1 inhalation two times a day  #1 x 6   Entered and Authorized by:   Julieanne Manson MD   Signed by:   Julieanne Manson MD on 05/28/2009   Method used:    Faxed to ...       [facility] - [facility] (retail)       [address] [name], [place]  [address]       Ph: [phone]       Fax: [phone]   RxID:   [id number] PROMETHAZINE HCL 25 MG TABS (PROMETHAZINE HCL) 1 tab by mouth every 6 hours as needed for nausea.  #20 x 0   Entered and  Authorized by:   Julieanne Manson MD   Signed by:   Julieanne Manson MD on 05/28/2009   Method used:   Faxed to ...       [facility] - [facility] (retail)       [address] [place], [place]  [address]       Ph: [phone]       Fax: [phone]   RxID:   [id number] LISINOPRIL 10 MG TABS (LISINOPRIL) 1 tab by mouth daily  #30 x 6   Entered and Authorized by:   Julieanne Manson MD   Signed by:   Julieanne Manson MD on 05/28/2009   Method used:   Faxed to ...       [facility] - [facility] (retail)       [address] [place], [place]  [address]       Ph: [phone]       Fax: [phone]   RxID:   [id number]

## 2010-06-10 NOTE — Letter (Signed)
Summary: TEST ORDER FORM//ULTRASOUND//APPT DATE & TIME  TEST ORDER FORM//ULTRASOUND//APPT DATE & TIME   Imported By: Arta Bruce 05/11/2010 15:33:57  _____________________________________________________________________  External Attachment:    Type:   Image     Comment:   External Document

## 2010-06-10 NOTE — Progress Notes (Signed)
Summary: Oxycodone  Phone Note Call from Patient Call back at [facility] Phone [phone]   Summary of Call: The pt needs more refills from his oxycodone medication because he only has one left.  Pt still has pain in his abdominal area.  [facility] [address] [phone]) Delrae Alfred Md Initial call taken by: Manon Hilding,  June 05, 2009 10:35 AM  Follow-up for Phone Call        Last got #20 on 05/28/09. Follow-up by: Vesta Mixer CMA,  June 05, 2009 11:26 AM  Additional Follow-up for Phone Call Additional follow up Details #1::        We discussed that the pain meds would only be a short term thing.  No refill Additional Follow-up by: Julieanne Manson MD,  June 05, 2009 5:24 PM    Additional Follow-up for Phone Call Additional follow up Details #2::    Pt aware, however he is asking can he get something not as strong then.  He said the pain is better, just having occasional pain off and on now, so would still like something, but it does not have to be the same med. Follow-up by: Vesta Mixer CMA,  June 08, 2009 8:59 AM  Additional Follow-up for Phone Call Additional follow up Details #3:: Details for Additional Follow-up Action Taken: Recommend Tylenol 1000 mg by mouth two times a day   Julieanne Manson MD  June 08, 2009 2:10 PM   Spoke with older sounding lady who yelled for the pt then hung up on me.  When I called back it went to the answering machince so I left a msg for pt to call................ Vesta Mixer CMA  June 08, 2009 4:06 PM   Pt aware Additional Follow-up by: Vesta Mixer CMA,  June 09, 2009 9:14 AM

## 2010-06-10 NOTE — Assessment & Plan Note (Signed)
Summary: HTN/Abdominal pain   Vital Signs:  Patient profile:   40 year old male Height:      67 inches Weight:      142.7 pounds BMI:     22.43 Temp:     98.0 degrees F oral Pulse rate:   100 / minute Pulse rhythm:   regular Resp:     20 per minute BP sitting:   114 / 84  (left arm) Cuff size:   regular  Vitals Entered By: Levon Hedger (April 30, 2010 10:34 AM) CC: [facility] urgent care visit...muscles spasms, Hypertension Management Is Patient Diabetic? No Pain Assessment Patient in pain? yes     Location: legs, pancreas  Does patient need assistance? Functional Status Self care Ambulation Normal   CC:  [facility] urgent care visit...muscles spasms and Hypertension Management.  History of Present Illness:  Pt into the office for f/u on ER visit He has been to the ER on December 17, 18 and 19.  December 17th -  Muscle aches pt was started on ibuprofen 400mg  and valium as needed   December 18th - Muscle aches electrolytes normal today during exam  December 19 - Pt was given vicodin as needed and advised to continue to take valium   Hx Pancreatitis - week long hospitalization the early part of 2011. Reports since that time he has quit drinking since 11/2009. Reports that he really started drinking at age 18 and he quit at age 46. Pt has concerns that he has some pancreatic cancer.    Social - pt is not employed at this time.   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  Hypertension History:      He denies headache, chest pain, and palpitations.  He notes no problems with any antihypertensive medication side effects.  pt was changed to clonidine during his multiple ER visits.  he has been taking it and actually been taking some of his family's meds.        Positive major cardiovascular risk factors include hypertension and current tobacco user.  Negative major cardiovascular risk factors include male age less than 47 years old and no  history of diabetes or hyperlipidemia.        Further assessment for target organ damage reveals no history of ASHD, cardiac end-organ damage (CHF/LVH), stroke/TIA, peripheral vascular disease, renal insufficiency, or hypertensive retinopathy.     Habits & Providers  Alcohol-Tobacco-Diet     Alcohol drinks/day: 2     Alcohol Counseling: to decrease amount and/or frequency of alcohol intake     Alcohol type: beer     Tobacco Status: current     Tobacco Counseling: to quit use of tobacco products     Cigarette Packs/Day: 1/2      Year Started: 16 years  Exercise-Depression-Behavior     Does Patient Exercise: yes     Type of exercise: walks     Drug Use: yes     Seat Belt Use: 100  Comments: Pt reports that he quit drinking ETOH in   Allergies (verified): No Known Drug Allergies  Review of Systems General:  Denies fever. CV:  Denies chest pain or discomfort. Resp:  Denies cough. GI:  Complains of abdominal pain; denies nausea and vomiting.  Physical Exam  General:  alert.   Head:  normocephalic.   Lungs:  normal breath sounds.   Heart:  normal rate and regular rhythm.   Abdomen:  right upper quad tenderness  Msk:  up to the exam table right wrist - ganglion, circumscribed, palpable lesion Neurologic:  alert & oriented X3.   Skin:  color normal.   Psych:  Oriented X3.     Impression & Recommendations:  Problem # 1:  HYPERTENSION (ICD-401.9) DASH diet  will refill meds The following medications were removed from the medication list:    Lisinopril 10 Mg Tabs (Lisinopril) .[name]... 1 tab by mouth daily His updated medication list for this problem includes:    Clonidine Hcl 0.1 Mg Tabs (Clonidine hcl) ..... One tablet by mouth two times a day for blood pressure  Problem # 2:  GANGLION CYST (ICD-727.43) seen today advised pt that if this is asymptomatic then no need for intervention and will monitor  Problem # 3:  BACK PAIN (ICD-724.5) Will start anti-inflammatories  and muscle relaxers heat pad The following medications were removed from the medication list:    Oxycodone-acetaminophen 5-500 Mg Caps (Oxycodone-acetaminophen) .[name]... 1 cap by mouth every 6 hours as needed for pain His updated medication list for this problem includes:    Naproxen 500 Mg Tabs (Naproxen) ..... One tablet by mouth two times a day for back pain    Cyclobenzaprine Hcl 10 Mg Tabs (Cyclobenzaprine hcl) ..... One tablet by mouth nightly as needed  for spasms  Orders: UA Dipstick w/o Micro (manual) ([address])  Problem # 4:  ABDOMINAL PAIN (ICD-789.00) will order abdominal u/s  Complete Medication List: 1)  Seroquel 25 Mg Tabs (Quetiapine fumarate) .[name].. 1 tab by mouth qhs 2)  Fluoxetine Hcl 10 Mg Caps (Fluoxetine hcl) .[name].. 1 tab by mouth every am. 3)  Ventolin Hfa 108 (90 Base) Mcg/act Aers (Albuterol sulfate) .... 2 puffs by mouth two times a day as needed for shortness of breath 4)  Advair Diskus 100-50 Mcg/dose Misc (Fluticasone-salmeterol) .[name].. 1 inhalation two times a day 5)  Clonidine Hcl 0.1 Mg Tabs (Clonidine hcl) .... One tablet by mouth two times a day for blood pressure 6)  Naproxen 500 Mg Tabs (Naproxen) .... One tablet by mouth two times a day for back pain 7)  Cyclobenzaprine Hcl 10 Mg Tabs (Cyclobenzaprine hcl) .... One tablet by mouth nightly as needed  for spasms  Other Orders: Ultrasound (Ultrasound)  Hypertension Assessment/Plan:      The patient's hypertensive risk group is category B: At least one risk factor (excluding diabetes) with no target organ damage.  His calculated 10 year risk of coronary heart disease is 3 %.  Today's blood pressure is 114/84.  His blood pressure goal is < 140/90.  Patient Instructions: 1)  Follow up with Dr. Delrae Alfred for other chronic issues. 2)  Discuss Ganglion cyct and Pysch medications 3)  You will be scheduled for an abdominal ultrasound. 4)  Your advair and inhalers have been sent to [facility] 5)  Back pain -  Take Anti-inflammatory - naprosyn two times a day as needed with food then may take muscle relaxer as needed  Prescriptions: CYCLOBENZAPRINE HCL 10 MG TABS (CYCLOBENZAPRINE HCL) One tablet by mouth nightly as needed  for spasms  #20 x 0   Entered and Authorized by:   Lehman Prom FNP   Signed by:   Lehman Prom FNP on 04/30/2010   Method used:   Print then Give to Patient   RxID:   [id number] NAPROXEN 500 MG TABS (NAPROXEN) One tablet by mouth two times a day for back pain  #50 x 0   Entered and Authorized by:   Zena Amos  Daphine Deutscher FNP   Signed by:   Lehman Prom FNP on 04/30/2010   Method used:   Print then Give to Patient   RxID:   [id number] ADVAIR DISKUS 100-50 MCG/DOSE  MISC (FLUTICASONE-SALMETEROL) 1 inhalation two times a day  #1 x 5   Entered and Authorized by:   Lehman Prom FNP   Signed by:   Lehman Prom FNP on 04/30/2010   Method used:   Faxed to ...       [facility] (retail)       [address] [place], [place]  [address]       Ph: [phone]       Fax: [phone]   RxID:   [id number] VENTOLIN HFA 108 (90 BASE) MCG/ACT AERS (ALBUTEROL SULFATE) 2 puffs by mouth two times a day as needed for shortness of breath  #1 x 5   Entered and Authorized by:   Lehman Prom FNP   Signed by:   Lehman Prom FNP on 04/30/2010   Method used:   Faxed to ...       [facility] (retail)       [address] [place], [place]  [address]       Ph: [phone]       Fax: [phone]   RxID:   [id number] ADVAIR DISKUS 100-50 MCG/DOSE  MISC (FLUTICASONE-SALMETEROL) 1 inhalation two times a day  #1 x 6   Entered and Authorized by:   Lehman Prom FNP   Signed by:   Lehman Prom FNP on 04/30/2010   Method used:   Print then Give to Patient   RxID:   [id number] CLONIDINE HCL 0.1 MG TABS (CLONIDINE HCL) One tablet by mouth two times a day for blood  pressure  #60 x 3   Entered and Authorized by:   Lehman Prom FNP   Signed by:   Lehman Prom FNP on 04/30/2010   Method used:   Print then Give to Patient   RxID:   [id number]    Orders Added: 1)  Est. Patient Level III [99833] 2)  Ultrasound [Ultrasound] 3)  UA Dipstick w/o Micro (manual) [81002]    Laboratory Results   Urine Tests  Date/Time Received: April 30, 2010 12:03 PM   Routine Urinalysis   Color: lt. yellow Appearance: Clear Glucose: negative   (Normal Range: Negative) Bilirubin: negative   (Normal Range: Negative) Ketone: negative   (Normal Range: Negative) Spec. Gravity: <1.005   (Normal Range: 1.003-1.035) Blood: negative   (Normal Range: Negative) pH: 6.0   (Normal Range: 5.0-8.0) Protein: negative   (Normal Range: Negative) Urobilinogen: 0.2   (Normal Range: 0-1) Nitrite: negative   (Normal Range: Negative) Leukocyte Esterace: negative   (Normal Range: Negative)

## 2010-06-15 ENCOUNTER — Emergency Department (HOSPITAL_COMMUNITY)
Admission: EM | Admit: 2010-06-15 | Discharge: 2010-06-15 | Disposition: A | Discharge status: Home / Self Care | Payer: Self-pay | Attending: Emergency Medicine | Admitting: Emergency Medicine

## 2010-06-15 DIAGNOSIS — J45909 Unspecified asthma, uncomplicated: Secondary | ICD-10-CM | POA: Insufficient documentation

## 2010-06-15 DIAGNOSIS — R1013 Epigastric pain: Secondary | ICD-10-CM | POA: Insufficient documentation

## 2010-06-15 DIAGNOSIS — K861 Other chronic pancreatitis: Secondary | ICD-10-CM | POA: Insufficient documentation

## 2010-06-15 DIAGNOSIS — I10 Essential (primary) hypertension: Secondary | ICD-10-CM | POA: Insufficient documentation

## 2010-06-15 DIAGNOSIS — M549 Dorsalgia, unspecified: Secondary | ICD-10-CM | POA: Insufficient documentation

## 2010-06-15 LAB — URINALYSIS, ROUTINE W REFLEX MICROSCOPIC
Protein, ur: NEGATIVE mg/dL
Urine Glucose, Fasting: NEGATIVE mg/dL
Urobilinogen, UA: 0.2 mg/dL (ref 0.0–1.0)

## 2010-06-15 LAB — BASIC METABOLIC PANEL
Chloride: 106 mEq/L (ref 96–112)
Creatinine, Ser: 0.82 mg/dL (ref 0.4–1.5)
GFR calc Af Amer: >60 mL/min (ref >60)
Potassium: 3.9 mEq/L (ref 3.5–5.1)

## 2010-06-15 LAB — DIFFERENTIAL
Basophils Relative: 0 % (ref 0–1)
Eosinophils Absolute: 0.2 10*3/uL (ref 0.0–0.7)
Neutrophils Relative %: 55 % (ref 43–77)

## 2010-06-15 LAB — RAPID URINE DRUG SCREEN, HOSP PERFORMED
Barbiturates: NOT DETECTED
Benzodiazepines: NOT DETECTED
Cocaine: NOT DETECTED

## 2010-06-15 LAB — CBC
Platelets: 211 10*3/uL (ref 150–400)
RBC: 3.77 MIL/uL — ABNORMAL LOW (ref 4.22–5.81)
WBC: 8.7 10*3/uL (ref 4.0–10.5)

## 2010-06-15 LAB — LIPASE, BLOOD: Lipase: 127 U/L — ABNORMAL HIGH (ref 11–59)

## 2010-06-16 ENCOUNTER — Emergency Department (HOSPITAL_COMMUNITY)
Admission: EM | Admit: 2010-06-16 | Discharge: 2010-06-16 | Disposition: A | Discharge status: Home / Self Care | Payer: Self-pay | Attending: Emergency Medicine | Admitting: Emergency Medicine

## 2010-06-16 DIAGNOSIS — R110 Nausea: Secondary | ICD-10-CM | POA: Insufficient documentation

## 2010-06-16 DIAGNOSIS — R109 Unspecified abdominal pain: Secondary | ICD-10-CM | POA: Insufficient documentation

## 2010-06-16 DIAGNOSIS — R945 Abnormal results of liver function studies: Secondary | ICD-10-CM | POA: Insufficient documentation

## 2010-06-16 DIAGNOSIS — K861 Other chronic pancreatitis: Secondary | ICD-10-CM | POA: Insufficient documentation

## 2010-06-16 LAB — CBC
Hemoglobin: 12.6 g/dL — ABNORMAL LOW (ref 13.0–17.0)
MCH: 32.3 pg (ref 26.0–34.0)
MCHC: 33.7 g/dL (ref 30.0–36.0)
MCV: 95.9 fL (ref 78.0–100.0)

## 2010-06-16 LAB — DIFFERENTIAL
Basophils Relative: 0 % (ref 0–1)
Monocytes Absolute: 0.6 10*3/uL (ref 0.1–1.0)
Monocytes Relative: 8 % (ref 3–12)
Neutro Abs: 3.6 10*3/uL (ref 1.7–7.7)

## 2010-06-16 LAB — COMPREHENSIVE METABOLIC PANEL
ALT: 184 U/L — ABNORMAL HIGH (ref 0–53)
CO2: 27 mEq/L (ref 19–32)
Calcium: 9.1 mg/dL (ref 8.4–10.5)
Creatinine, Ser: 0.88 mg/dL (ref 0.4–1.5)
GFR calc non Af Amer: >60 mL/min (ref >60)
Glucose, Bld: 87 mg/dL (ref 70–99)
Sodium: 137 mEq/L (ref 135–145)

## 2010-06-16 LAB — LIPASE, BLOOD: Lipase: 31 U/L (ref 11–59)

## 2010-07-19 LAB — POCT I-STAT, CHEM 8
BUN: 8 mg/dL (ref 6–23)
Calcium, Ion: 1.23 mmol/L (ref 1.12–1.32)
Chloride: 105 mEq/L (ref 96–112)
Chloride: 107 mEq/L (ref 96–112)
Creatinine, Ser: 0.8 mg/dL (ref 0.4–1.5)
Glucose, Bld: 88 mg/dL (ref 70–99)
HCT: 41 % (ref 39.0–52.0)
HCT: 40 % (ref 39.0–52.0)
Potassium: 3.8 mEq/L (ref 3.5–5.1)
Potassium: 4 mEq/L (ref 3.5–5.1)
Sodium: 139 mEq/L (ref 135–145)

## 2010-07-19 LAB — BASIC METABOLIC PANEL
BUN: 11 mg/dL (ref 6–23)
Chloride: 109 mEq/L (ref 96–112)
Glucose, Bld: 91 mg/dL (ref 70–99)
Potassium: 3.8 mEq/L (ref 3.5–5.1)
Sodium: 142 mEq/L (ref 135–145)

## 2010-07-19 LAB — DIFFERENTIAL
Lymphocytes Relative: 28 % (ref 12–46)
Monocytes Absolute: 0.9 10*3/uL (ref 0.1–1.0)
Monocytes Relative: 9 % (ref 3–12)
Neutro Abs: 5.7 10*3/uL (ref 1.7–7.7)

## 2010-07-19 LAB — CBC
MCH: 34.1 pg — ABNORMAL HIGH (ref 26.0–34.0)
MCHC: 33.8 g/dL (ref 30.0–36.0)
MCV: 100.9 fL — ABNORMAL HIGH (ref 78.0–100.0)
Platelets: 270 10*3/uL (ref 150–400)
RDW: 14.1 % (ref 11.5–15.5)
WBC: 9.7 10*3/uL (ref 4.0–10.5)

## 2010-07-19 LAB — URINALYSIS, ROUTINE W REFLEX MICROSCOPIC
Bilirubin Urine: NEGATIVE
Ketones, ur: NEGATIVE mg/dL
Nitrite: NEGATIVE
Specific Gravity, Urine: 1.014 (ref 1.005–1.030)
Urobilinogen, UA: 0.2 mg/dL (ref 0.0–1.0)

## 2010-07-19 LAB — CK: Total CK: 299 U/L — ABNORMAL HIGH (ref 7–232)

## 2010-07-19 LAB — COMPREHENSIVE METABOLIC PANEL
Albumin: 3.8 g/dL (ref 3.5–5.2)
BUN: 16 mg/dL (ref 6–23)
Calcium: 9.1 mg/dL (ref 8.4–10.5)
Creatinine, Ser: 0.75 mg/dL (ref 0.4–1.5)
Total Protein: 6.9 g/dL (ref 6.0–8.3)

## 2010-07-24 LAB — DIFFERENTIAL
Basophils Absolute: 0 10*3/uL (ref 0.0–0.1)
Basophils Absolute: 0.1 10*3/uL (ref 0.0–0.1)
Basophils Relative: 0 % (ref 0–1)
Basophils Relative: 1 % (ref 0–1)
Eosinophils Absolute: 0.3 10*3/uL (ref 0.0–0.7)
Eosinophils Absolute: 0.3 10*3/uL (ref 0.0–0.7)
Eosinophils Relative: 3 % (ref 0–5)
Lymphocytes Relative: 36 % (ref 12–46)
Monocytes Absolute: 1 10*3/uL (ref 0.1–1.0)
Neutrophils Relative %: 46 % (ref 43–77)
Neutrophils Relative %: 67 % (ref 43–77)

## 2010-07-24 LAB — CBC
HCT: 33.9 % — ABNORMAL LOW (ref 39.0–52.0)
Hemoglobin: 11 g/dL — ABNORMAL LOW (ref 13.0–17.0)
Hemoglobin: 11.3 g/dL — ABNORMAL LOW (ref 13.0–17.0)
MCV: 100.1 fL — ABNORMAL HIGH (ref 78.0–100.0)
RBC: 3.34 MIL/uL — ABNORMAL LOW (ref 4.22–5.81)
RBC: 3.41 MIL/uL — ABNORMAL LOW (ref 4.22–5.81)
RDW: 13.5 % (ref 11.5–15.5)
WBC: 9.8 10*3/uL (ref 4.0–10.5)

## 2010-07-24 LAB — COMPREHENSIVE METABOLIC PANEL
ALT: 10 U/L (ref 0–53)
ALT: 16 U/L (ref 0–53)
AST: 20 U/L (ref 0–37)
Alkaline Phosphatase: 80 U/L (ref 39–117)
BUN: <1 mg/dL — ABNORMAL LOW (ref 6–23)
CO2: 26 mEq/L (ref 19–32)
CO2: 26 mEq/L (ref 19–32)
Chloride: 102 mEq/L (ref 96–112)
Chloride: 109 mEq/L (ref 96–112)
GFR calc Af Amer: >60 mL/min (ref >60)
GFR calc non Af Amer: >60 mL/min (ref >60)
GFR calc non Af Amer: >60 mL/min (ref >60)
Glucose, Bld: 85 mg/dL (ref 70–99)
Glucose, Bld: 90 mg/dL (ref 70–99)
Potassium: 4.6 mEq/L (ref 3.5–5.1)
Sodium: 138 mEq/L (ref 135–145)
Sodium: 140 mEq/L (ref 135–145)
Total Bilirubin: 0.5 mg/dL (ref 0.3–1.2)
Total Bilirubin: 0.5 mg/dL (ref 0.3–1.2)

## 2010-07-24 LAB — ETHANOL: Alcohol, Ethyl (B): <5 mg/dL (ref 0–10)

## 2010-07-24 LAB — LIPASE, BLOOD: Lipase: 32 U/L (ref 11–59)

## 2010-07-24 LAB — RAPID URINE DRUG SCREEN, HOSP PERFORMED
Amphetamines: NOT DETECTED
Barbiturates: NOT DETECTED
Benzodiazepines: NOT DETECTED

## 2010-07-24 LAB — URINALYSIS, ROUTINE W REFLEX MICROSCOPIC
Glucose, UA: NEGATIVE mg/dL
Ketones, ur: NEGATIVE mg/dL
Protein, ur: NEGATIVE mg/dL
Urobilinogen, UA: 0.2 mg/dL (ref 0.0–1.0)

## 2010-07-24 IMAGING — CR DG CHEST 2V
2 series · 2 of 2 positions shown · non-contrast
Comparison: 09/04/2007

CLINICAL DATA: Chest pain, cough, smoker

CHEST - 2 VIEW

[w chest pa]
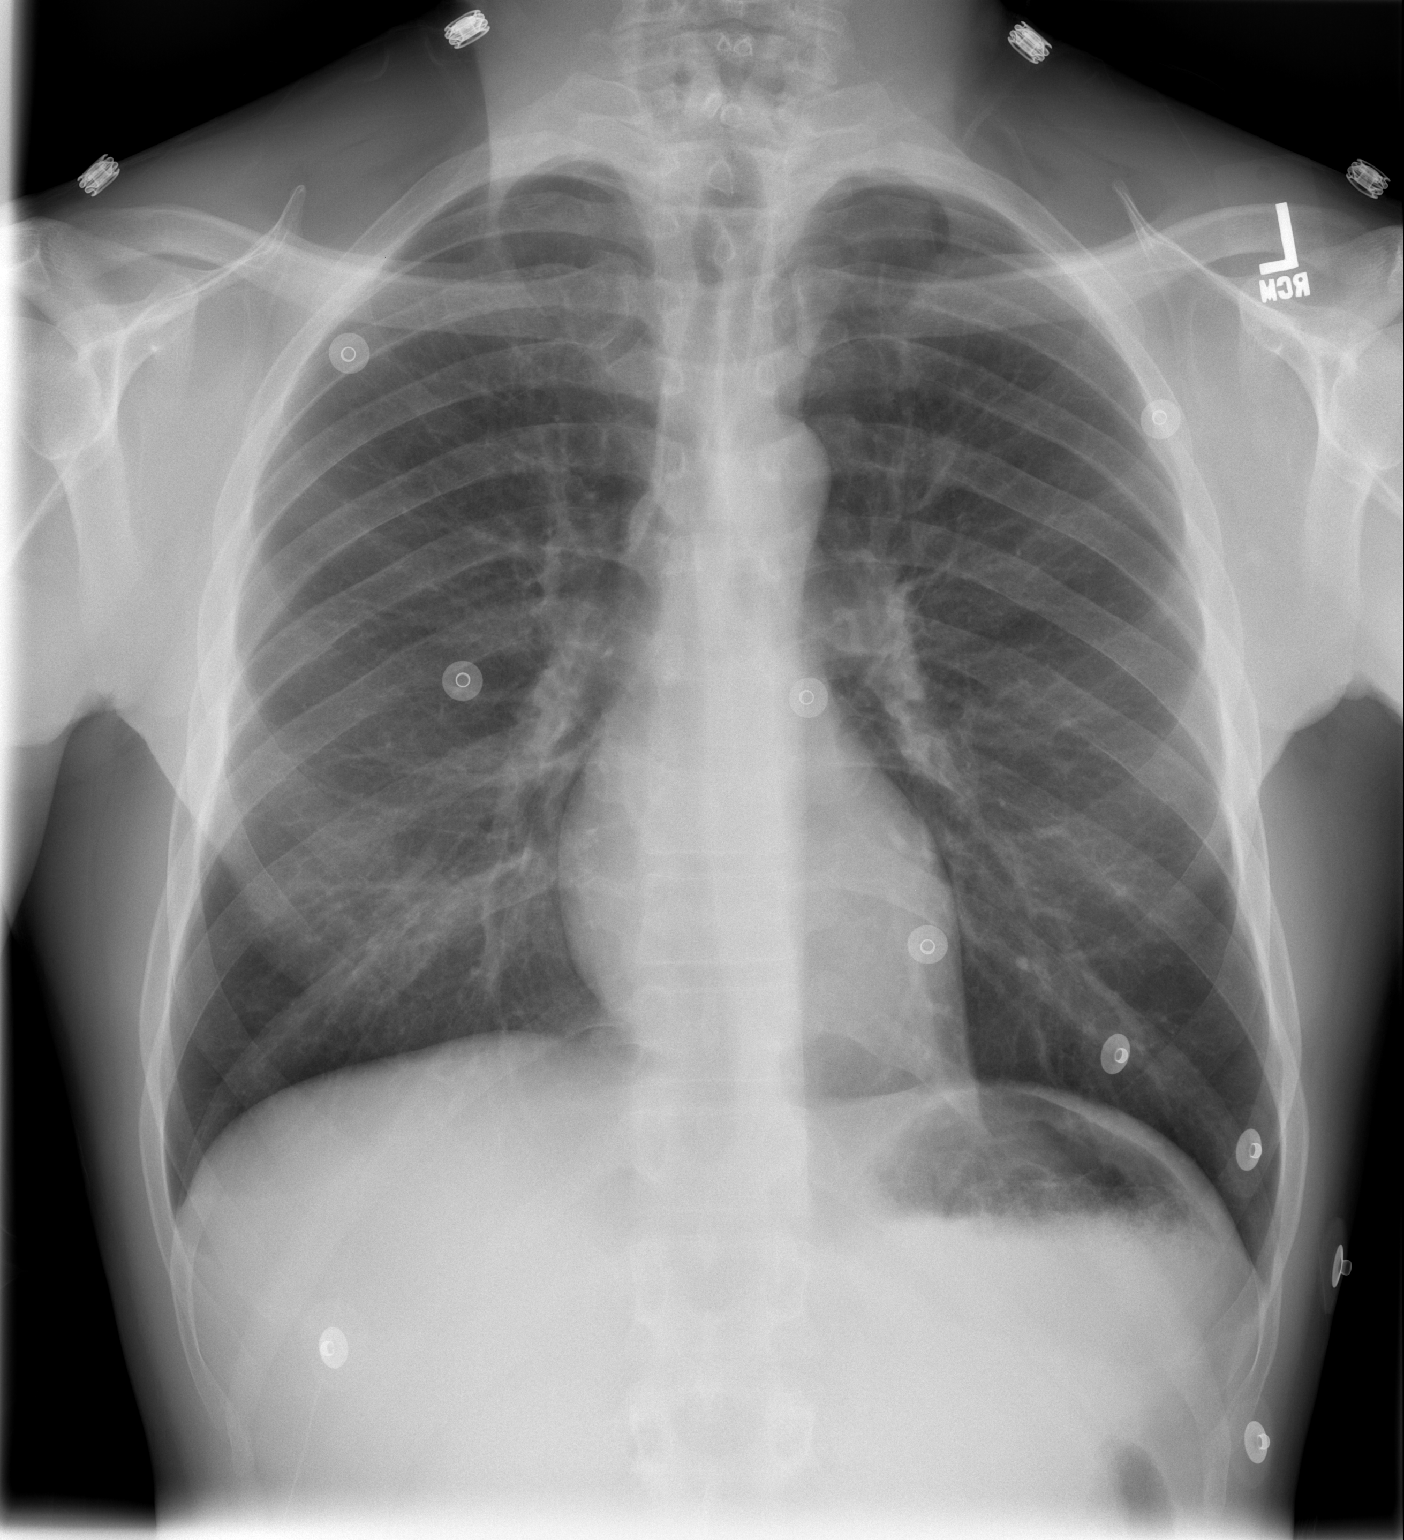

[w chest lat]
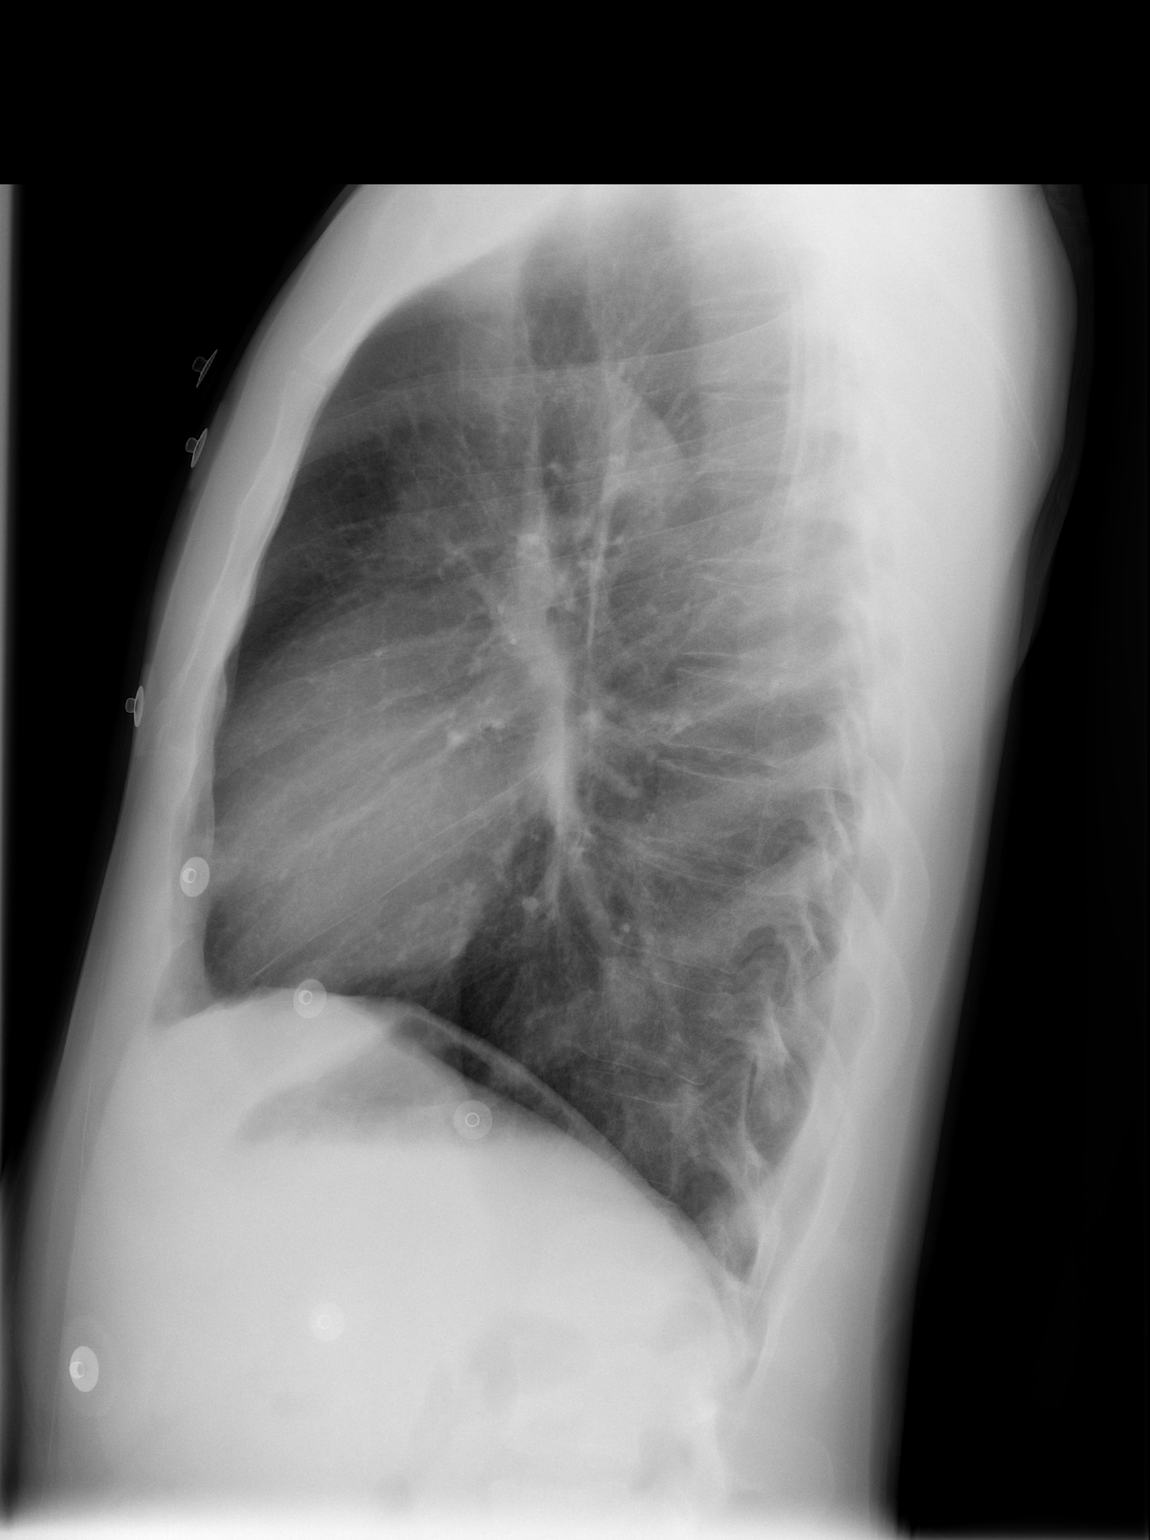

[2 of 2 positions shown; findings below may reference images not displayed]

FINDINGS: Normal heart size, mediastinal contours, and pulmonary vascularity.
Lungs clear.
Bones unremarkable.
No pneumothorax.
IMPRESSION: No acute abnormalities.

## 2010-07-26 ENCOUNTER — Emergency Department (HOSPITAL_COMMUNITY)
Admission: EM | Admit: 2010-07-26 | Discharge: 2010-07-26 | Disposition: A | Discharge status: Home / Self Care | Payer: Self-pay | Attending: Emergency Medicine | Admitting: Emergency Medicine

## 2010-07-26 ENCOUNTER — Inpatient Hospital Stay (INDEPENDENT_AMBULATORY_CARE_PROVIDER_SITE_OTHER)
Admit: 2010-07-26 | Discharge: 2010-07-26 | Disposition: A | Discharge status: Home / Self Care | Payer: Self-pay | Attending: Emergency Medicine | Admitting: Emergency Medicine

## 2010-07-26 DIAGNOSIS — Z79899 Other long term (current) drug therapy: Secondary | ICD-10-CM | POA: Insufficient documentation

## 2010-07-26 DIAGNOSIS — R110 Nausea: Secondary | ICD-10-CM | POA: Insufficient documentation

## 2010-07-26 DIAGNOSIS — R300 Dysuria: Secondary | ICD-10-CM

## 2010-07-26 DIAGNOSIS — J45909 Unspecified asthma, uncomplicated: Secondary | ICD-10-CM | POA: Insufficient documentation

## 2010-07-26 DIAGNOSIS — R1013 Epigastric pain: Secondary | ICD-10-CM | POA: Insufficient documentation

## 2010-07-26 DIAGNOSIS — I10 Essential (primary) hypertension: Secondary | ICD-10-CM | POA: Insufficient documentation

## 2010-07-26 DIAGNOSIS — K861 Other chronic pancreatitis: Secondary | ICD-10-CM | POA: Insufficient documentation

## 2010-07-26 DIAGNOSIS — M79609 Pain in unspecified limb: Secondary | ICD-10-CM

## 2010-07-26 DIAGNOSIS — R10816 Epigastric abdominal tenderness: Secondary | ICD-10-CM | POA: Insufficient documentation

## 2010-07-26 DIAGNOSIS — R197 Diarrhea, unspecified: Secondary | ICD-10-CM | POA: Insufficient documentation

## 2010-07-26 LAB — POCT URINALYSIS DIP (DEVICE)
Glucose, UA: NEGATIVE mg/dL
Nitrite: NEGATIVE
Protein, ur: 30 mg/dL — AB
Urobilinogen, UA: 1 mg/dL (ref 0.0–1.0)

## 2010-07-26 LAB — COMPREHENSIVE METABOLIC PANEL
AST: 116 U/L — ABNORMAL HIGH (ref 0–37)
Albumin: 3.4 g/dL — ABNORMAL LOW (ref 3.5–5.2)
Alkaline Phosphatase: 68 U/L (ref 39–117)
BUN: 6 mg/dL (ref 6–23)
GFR calc Af Amer: >60 mL/min (ref >60)
Potassium: 3.6 mEq/L (ref 3.5–5.1)
Total Protein: 6.5 g/dL (ref 6.0–8.3)

## 2010-07-26 LAB — DIFFERENTIAL
Basophils Absolute: 0 10*3/uL (ref 0.0–0.1)
Basophils Relative: 0 % (ref 0–1)
Eosinophils Absolute: 0.1 10*3/uL (ref 0.0–0.7)
Eosinophils Relative: 2 % (ref 0–5)
Neutrophils Relative %: 51 % (ref 43–77)

## 2010-07-26 LAB — POCT I-STAT, CHEM 8
Calcium, Ion: 1.13 mmol/L (ref 1.12–1.32)
Glucose, Bld: 92 mg/dL (ref 70–99)
HCT: 36 % — ABNORMAL LOW (ref 39.0–52.0)
Hemoglobin: 12.2 g/dL — ABNORMAL LOW (ref 13.0–17.0)

## 2010-07-26 LAB — CBC
MCV: 92.7 fL (ref 78.0–100.0)
Platelets: 179 10*3/uL (ref 150–400)
RDW: 13.1 % (ref 11.5–15.5)
WBC: 5.7 10*3/uL (ref 4.0–10.5)

## 2010-07-26 LAB — CK: Total CK: 172 U/L (ref 7–232)

## 2010-07-26 LAB — LIPASE, BLOOD: Lipase: 27 U/L (ref 11–59)

## 2010-07-27 LAB — GC/CHLAMYDIA PROBE AMP, GENITAL: GC Probe Amp, Genital: NEGATIVE

## 2010-07-28 LAB — COMPREHENSIVE METABOLIC PANEL
ALT: 85 U/L — ABNORMAL HIGH (ref 0–53)
Albumin: 4 g/dL (ref 3.5–5.2)
Calcium: 8.9 mg/dL (ref 8.4–10.5)
GFR calc Af Amer: >60 mL/min (ref >60)
Glucose, Bld: 83 mg/dL (ref 70–99)
Sodium: 136 mEq/L (ref 135–145)
Total Protein: 7.9 g/dL (ref 6.0–8.3)

## 2010-07-28 LAB — CBC
Hemoglobin: 14 g/dL (ref 13.0–17.0)
MCHC: 33.3 g/dL (ref 30.0–36.0)
Platelets: 160 10*3/uL (ref 150–400)
RDW: 13.1 % (ref 11.5–15.5)

## 2010-07-28 LAB — URINALYSIS, ROUTINE W REFLEX MICROSCOPIC
Glucose, UA: NEGATIVE mg/dL
Ketones, ur: NEGATIVE mg/dL
pH: 5.5 (ref 5.0–8.0)

## 2010-07-28 LAB — DIFFERENTIAL
Eosinophils Absolute: 0 10*3/uL (ref 0.0–0.7)
Lymphs Abs: 2.5 10*3/uL (ref 0.7–4.0)
Monocytes Relative: 13 % — ABNORMAL HIGH (ref 3–12)
Neutrophils Relative %: 59 % (ref 43–77)

## 2010-07-30 LAB — DIFFERENTIAL
Basophils Absolute: 0.1 10*3/uL (ref 0.0–0.1)
Basophils Relative: 1 % (ref 0–1)
Eosinophils Absolute: 0.1 10*3/uL (ref 0.0–0.7)
Eosinophils Relative: 1 % (ref 0–5)
Lymphocytes Relative: 34 % (ref 12–46)
Lymphs Abs: 2.8 10*3/uL (ref 0.7–4.0)
Monocytes Absolute: 0.7 10*3/uL (ref 0.1–1.0)
Monocytes Relative: 9 % (ref 3–12)
Neutro Abs: 4.5 10*3/uL (ref 1.7–7.7)
Neutrophils Relative %: 55 % (ref 43–77)

## 2010-07-30 LAB — URINALYSIS, ROUTINE W REFLEX MICROSCOPIC
Bilirubin Urine: NEGATIVE
Glucose, UA: NEGATIVE mg/dL
Hgb urine dipstick: NEGATIVE
Ketones, ur: NEGATIVE mg/dL
Nitrite: NEGATIVE
Protein, ur: NEGATIVE mg/dL
Specific Gravity, Urine: 1.01 (ref 1.005–1.030)
Urobilinogen, UA: 0.2 mg/dL (ref 0.0–1.0)
pH: 5.5 (ref 5.0–8.0)

## 2010-07-30 LAB — COMPREHENSIVE METABOLIC PANEL
ALT: 47 U/L (ref 0–53)
AST: 41 U/L — ABNORMAL HIGH (ref 0–37)
Albumin: 3.5 g/dL (ref 3.5–5.2)
Alkaline Phosphatase: 99 U/L (ref 39–117)
BUN: 5 mg/dL — ABNORMAL LOW (ref 6–23)
CO2: 27 mEq/L (ref 19–32)
Calcium: 8.7 mg/dL (ref 8.4–10.5)
Chloride: 110 mEq/L (ref 96–112)
Creatinine, Ser: 0.74 mg/dL (ref 0.4–1.5)
GFR calc Af Amer: >60 mL/min (ref >60)
GFR calc non Af Amer: >60 mL/min (ref >60)
Glucose, Bld: 84 mg/dL (ref 70–99)
Potassium: 4.4 mEq/L (ref 3.5–5.1)
Sodium: 145 mEq/L (ref 135–145)
Total Bilirubin: 0.9 mg/dL (ref 0.3–1.2)
Total Protein: 6.8 g/dL (ref 6.0–8.3)

## 2010-07-30 LAB — CBC
HCT: 36.4 % — ABNORMAL LOW (ref 39.0–52.0)
Hemoglobin: 12 g/dL — ABNORMAL LOW (ref 13.0–17.0)
MCHC: 33.1 g/dL (ref 30.0–36.0)
MCV: 98 fL (ref 78.0–100.0)
Platelets: 215 10*3/uL (ref 150–400)
RBC: 3.71 MIL/uL — ABNORMAL LOW (ref 4.22–5.81)
RDW: 13.5 % (ref 11.5–15.5)
WBC: 8.2 10*3/uL (ref 4.0–10.5)

## 2010-07-30 LAB — RAPID URINE DRUG SCREEN, HOSP PERFORMED
Cocaine: NOT DETECTED
Opiates: NOT DETECTED

## 2010-07-30 LAB — LIPASE, BLOOD: Lipase: 41 U/L (ref 11–59)

## 2010-08-09 LAB — COMPREHENSIVE METABOLIC PANEL
ALT: 100 U/L — ABNORMAL HIGH (ref 0–53)
ALT: 44 U/L (ref 0–53)
ALT: 56 U/L — ABNORMAL HIGH (ref 0–53)
AST: 105 U/L — ABNORMAL HIGH (ref 0–37)
AST: 52 U/L — ABNORMAL HIGH (ref 0–37)
Albumin: 2.8 g/dL — ABNORMAL LOW (ref 3.5–5.2)
Albumin: 3.3 g/dL — ABNORMAL LOW (ref 3.5–5.2)
Albumin: 4.3 g/dL (ref 3.5–5.2)
Alkaline Phosphatase: 109 U/L (ref 39–117)
Alkaline Phosphatase: 70 U/L (ref 39–117)
Alkaline Phosphatase: 76 U/L (ref 39–117)
Alkaline Phosphatase: 91 U/L (ref 39–117)
BUN: <1 mg/dL — ABNORMAL LOW (ref 6–23)
BUN: <1 mg/dL — ABNORMAL LOW (ref 6–23)
CO2: 26 mEq/L (ref 19–32)
Calcium: 7.7 mg/dL — ABNORMAL LOW (ref 8.4–10.5)
Chloride: 96 mEq/L (ref 96–112)
GFR calc Af Amer: >60 mL/min (ref >60)
GFR calc Af Amer: >60 mL/min (ref >60)
GFR calc Af Amer: >60 mL/min (ref >60)
GFR calc non Af Amer: >60 mL/min (ref >60)
Glucose, Bld: 89 mg/dL (ref 70–99)
Glucose, Bld: 97 mg/dL (ref 70–99)
Potassium: 3.6 mEq/L (ref 3.5–5.1)
Potassium: 3.8 mEq/L (ref 3.5–5.1)
Potassium: 3.9 mEq/L (ref 3.5–5.1)
Potassium: 4.5 mEq/L (ref 3.5–5.1)
Sodium: 136 mEq/L (ref 135–145)
Sodium: 138 mEq/L (ref 135–145)
Sodium: 141 mEq/L (ref 135–145)
Sodium: 141 mEq/L (ref 135–145)
Total Bilirubin: 1 mg/dL (ref 0.3–1.2)
Total Bilirubin: 1.1 mg/dL (ref 0.3–1.2)
Total Protein: 5.5 g/dL — ABNORMAL LOW (ref 6.0–8.3)
Total Protein: 6.7 g/dL (ref 6.0–8.3)
Total Protein: 7.8 g/dL (ref 6.0–8.3)

## 2010-08-09 LAB — URINALYSIS, ROUTINE W REFLEX MICROSCOPIC
Hgb urine dipstick: NEGATIVE
Nitrite: NEGATIVE
Specific Gravity, Urine: 1.026 (ref 1.005–1.030)
Urobilinogen, UA: 0.2 mg/dL (ref 0.0–1.0)

## 2010-08-09 LAB — POCT I-STAT, CHEM 8
Calcium, Ion: 1.12 mmol/L (ref 1.12–1.32)
Creatinine, Ser: 1 mg/dL (ref 0.4–1.5)
Glucose, Bld: 101 mg/dL — ABNORMAL HIGH (ref 70–99)
HCT: 50 % (ref 39.0–52.0)
Hemoglobin: 17 g/dL (ref 13.0–17.0)

## 2010-08-09 LAB — DIFFERENTIAL
Basophils Absolute: 0 10*3/uL (ref 0.0–0.1)
Basophils Absolute: 0 10*3/uL (ref 0.0–0.1)
Basophils Absolute: 0 10*3/uL (ref 0.0–0.1)
Basophils Relative: 0 % (ref 0–1)
Basophils Relative: 0 % (ref 0–1)
Basophils Relative: 0 % (ref 0–1)
Basophils Relative: 0 % (ref 0–1)
Eosinophils Absolute: 0.3 10*3/uL (ref 0.0–0.7)
Eosinophils Absolute: 0.4 10*3/uL (ref 0.0–0.7)
Eosinophils Relative: 3 % (ref 0–5)
Eosinophils Relative: 4 % (ref 0–5)
Lymphocytes Relative: 32 % (ref 12–46)
Lymphocytes Relative: 35 % (ref 12–46)
Monocytes Absolute: 0.9 10*3/uL (ref 0.1–1.0)
Monocytes Absolute: 0.9 10*3/uL (ref 0.1–1.0)
Monocytes Relative: 9 % (ref 3–12)
Neutro Abs: 5 10*3/uL (ref 1.7–7.7)
Neutro Abs: 5.1 10*3/uL (ref 1.7–7.7)
Neutrophils Relative %: 75 % (ref 43–77)

## 2010-08-09 LAB — CBC
HCT: 33.6 % — ABNORMAL LOW (ref 39.0–52.0)
HCT: 35.7 % — ABNORMAL LOW (ref 39.0–52.0)
HCT: 36.9 % — ABNORMAL LOW (ref 39.0–52.0)
HCT: 43.3 % (ref 39.0–52.0)
Hemoglobin: 11.3 g/dL — ABNORMAL LOW (ref 13.0–17.0)
Hemoglobin: 11.9 g/dL — ABNORMAL LOW (ref 13.0–17.0)
MCHC: 33.4 g/dL (ref 30.0–36.0)
Platelets: 150 10*3/uL (ref 150–400)
Platelets: 164 10*3/uL (ref 150–400)
Platelets: 201 10*3/uL (ref 150–400)
RBC: 3.37 MIL/uL — ABNORMAL LOW (ref 4.22–5.81)
RDW: 13 % (ref 11.5–15.5)
RDW: 13.1 % (ref 11.5–15.5)
WBC: 9.5 10*3/uL (ref 4.0–10.5)

## 2010-08-09 LAB — OPIATE, QUANTITATIVE, URINE
Hydrocodone: 670 ng/mL
Hydromorphone GC/MS Conf: 1500 ng/mL
Oxymorphone: NEGATIVE ng/mL

## 2010-08-09 LAB — LIPID PANEL
HDL: 49 mg/dL (ref >39)
Total CHOL/HDL Ratio: 2.6 RATIO
Triglycerides: 181 mg/dL — ABNORMAL HIGH (ref <150)

## 2010-08-09 LAB — DRUGS OF ABUSE SCREEN W/O ALC, ROUTINE URINE
Barbiturate Quant, Ur: NEGATIVE
Cocaine Metabolites: NEGATIVE
Creatinine,U: 48.9 mg/dL
Marijuana Metabolite: POSITIVE — AB
Opiate Screen, Urine: POSITIVE — AB
Propoxyphene: NEGATIVE

## 2010-08-09 LAB — AMYLASE: Amylase: 75 U/L (ref 0–105)

## 2010-08-09 LAB — LIPASE, BLOOD
Lipase: 67 U/L — ABNORMAL HIGH (ref 11–59)
Lipase: 98 U/L — ABNORMAL HIGH (ref 11–59)

## 2010-08-09 LAB — ETHANOL: Alcohol, Ethyl (B): <5 mg/dL (ref 0–10)

## 2010-08-09 LAB — MAGNESIUM: Magnesium: 1.7 mg/dL (ref 1.5–2.5)

## 2010-08-09 LAB — THC (MARIJUANA), URINE, CONFIRMATION: Marijuana, Ur-Confirmation: 140 ng/mL

## 2010-08-11 LAB — DIFFERENTIAL
Basophils Absolute: 0.1 10*3/uL (ref 0.0–0.1)
Basophils Relative: 1 % (ref 0–1)
Monocytes Absolute: 0.9 10*3/uL (ref 0.1–1.0)
Neutro Abs: 5.9 10*3/uL (ref 1.7–7.7)
Neutrophils Relative %: 60 % (ref 43–77)

## 2010-08-11 LAB — COMPREHENSIVE METABOLIC PANEL
Alkaline Phosphatase: 83 U/L (ref 39–117)
BUN: 1 mg/dL — ABNORMAL LOW (ref 6–23)
Chloride: 105 mEq/L (ref 96–112)
Glucose, Bld: 82 mg/dL (ref 70–99)
Potassium: 3.3 mEq/L — ABNORMAL LOW (ref 3.5–5.1)
Total Bilirubin: 0.5 mg/dL (ref 0.3–1.2)

## 2010-08-11 LAB — URINALYSIS, ROUTINE W REFLEX MICROSCOPIC
Glucose, UA: NEGATIVE mg/dL
pH: 6 (ref 5.0–8.0)

## 2010-08-11 LAB — CBC
HCT: 30 % — ABNORMAL LOW (ref 39.0–52.0)
Hemoglobin: 10 g/dL — ABNORMAL LOW (ref 13.0–17.0)
RBC: 2.95 MIL/uL — ABNORMAL LOW (ref 4.22–5.81)
WBC: 9.7 10*3/uL (ref 4.0–10.5)

## 2010-08-12 LAB — URINALYSIS, ROUTINE W REFLEX MICROSCOPIC
Hgb urine dipstick: NEGATIVE
Ketones, ur: NEGATIVE mg/dL
Protein, ur: NEGATIVE mg/dL
Urobilinogen, UA: 0.2 mg/dL (ref 0.0–1.0)

## 2010-08-12 LAB — COMPREHENSIVE METABOLIC PANEL
AST: 114 U/L — ABNORMAL HIGH (ref 0–37)
AST: 50 U/L — ABNORMAL HIGH (ref 0–37)
Albumin: 3.1 g/dL — ABNORMAL LOW (ref 3.5–5.2)
Albumin: 3.6 g/dL (ref 3.5–5.2)
BUN: 2 mg/dL — ABNORMAL LOW (ref 6–23)
Calcium: 7.7 mg/dL — ABNORMAL LOW (ref 8.4–10.5)
Calcium: 8.9 mg/dL (ref 8.4–10.5)
Chloride: 100 mEq/L (ref 96–112)
Chloride: 105 mEq/L (ref 96–112)
Creatinine, Ser: 0.78 mg/dL (ref 0.4–1.5)
Creatinine, Ser: 0.88 mg/dL (ref 0.4–1.5)
GFR calc Af Amer: >60 mL/min (ref >60)
GFR calc Af Amer: >60 mL/min (ref >60)
Total Bilirubin: 1.4 mg/dL — ABNORMAL HIGH (ref 0.3–1.2)
Total Protein: 5.8 g/dL — ABNORMAL LOW (ref 6.0–8.3)
Total Protein: 6.9 g/dL (ref 6.0–8.3)

## 2010-08-12 LAB — DIFFERENTIAL
Basophils Absolute: 0 10*3/uL (ref 0.0–0.1)
Eosinophils Relative: 2 % (ref 0–5)
Eosinophils Relative: 3 % (ref 0–5)
Lymphocytes Relative: 19 % (ref 12–46)
Lymphocytes Relative: 23 % (ref 12–46)
Lymphs Abs: 2.4 10*3/uL (ref 0.7–4.0)
Lymphs Abs: 2.4 10*3/uL (ref 0.7–4.0)
Monocytes Absolute: 0.7 10*3/uL (ref 0.1–1.0)
Monocytes Absolute: 1 10*3/uL (ref 0.1–1.0)
Monocytes Relative: 7 % (ref 3–12)
Monocytes Relative: 8 % (ref 3–12)
Neutro Abs: 7.1 10*3/uL (ref 1.7–7.7)
Neutro Abs: 9 10*3/uL — ABNORMAL HIGH (ref 1.7–7.7)

## 2010-08-12 LAB — CBC
HCT: 30.6 % — ABNORMAL LOW (ref 39.0–52.0)
MCV: 100.5 fL — ABNORMAL HIGH (ref 78.0–100.0)
MCV: 101 fL — ABNORMAL HIGH (ref 78.0–100.0)
Platelets: 165 10*3/uL (ref 150–400)
Platelets: 213 10*3/uL (ref 150–400)
RDW: 14.2 % (ref 11.5–15.5)
RDW: 14.2 % (ref 11.5–15.5)
WBC: 10.5 10*3/uL (ref 4.0–10.5)
WBC: 12.7 10*3/uL — ABNORMAL HIGH (ref 4.0–10.5)

## 2010-08-17 ENCOUNTER — Inpatient Hospital Stay (HOSPITAL_COMMUNITY)
Admission: EM | Admit: 2010-08-17 | Discharge: 2010-08-21 | DRG: 440 | Discharge status: Against Medical Advice | Payer: Self-pay | Attending: Internal Medicine | Admitting: Internal Medicine

## 2010-08-17 DIAGNOSIS — J45909 Unspecified asthma, uncomplicated: Secondary | ICD-10-CM | POA: Diagnosis present

## 2010-08-17 DIAGNOSIS — R197 Diarrhea, unspecified: Secondary | ICD-10-CM | POA: Diagnosis present

## 2010-08-17 DIAGNOSIS — K859 Acute pancreatitis without necrosis or infection, unspecified: Principal | ICD-10-CM | POA: Diagnosis present

## 2010-08-17 DIAGNOSIS — F17200 Nicotine dependence, unspecified, uncomplicated: Secondary | ICD-10-CM | POA: Diagnosis present

## 2010-08-17 DIAGNOSIS — E785 Hyperlipidemia, unspecified: Secondary | ICD-10-CM | POA: Diagnosis present

## 2010-08-17 DIAGNOSIS — I10 Essential (primary) hypertension: Secondary | ICD-10-CM | POA: Diagnosis present

## 2010-08-17 DIAGNOSIS — E876 Hypokalemia: Secondary | ICD-10-CM | POA: Diagnosis present

## 2010-08-17 DIAGNOSIS — E860 Dehydration: Secondary | ICD-10-CM | POA: Diagnosis present

## 2010-08-17 HISTORY — DX: Essential (primary) hypertension: I10

## 2010-08-17 LAB — HEPATIC FUNCTION PANEL
ALT: 155 U/L — ABNORMAL HIGH (ref 0–53)
Albumin: 4 g/dL (ref 3.5–5.2)
Alkaline Phosphatase: 92 U/L (ref 39–117)
Total Bilirubin: 1 mg/dL (ref 0.3–1.2)
Total Protein: 7 g/dL (ref 6.0–8.3)

## 2010-08-17 LAB — DIFFERENTIAL
Basophils Relative: 0 % (ref 0–1)
Lymphocytes Relative: 29 % (ref 12–46)
Monocytes Absolute: 1 10*3/uL (ref 0.1–1.0)
Monocytes Relative: 9 % (ref 3–12)
Neutro Abs: 6.5 10*3/uL (ref 1.7–7.7)
Neutrophils Relative %: 60 % (ref 43–77)

## 2010-08-17 LAB — CBC
HCT: 40.8 % (ref 39.0–52.0)
Hemoglobin: 14 g/dL (ref 13.0–17.0)
MCH: 31.7 pg (ref 26.0–34.0)
MCHC: 34.3 g/dL (ref 30.0–36.0)
RBC: 4.42 MIL/uL (ref 4.22–5.81)

## 2010-08-17 LAB — POCT I-STAT, CHEM 8
BUN: 13 mg/dL (ref 6–23)
Creatinine, Ser: 1.3 mg/dL (ref 0.4–1.5)
Glucose, Bld: 105 mg/dL — ABNORMAL HIGH (ref 70–99)
Potassium: 4.4 mEq/L (ref 3.5–5.1)
Sodium: 138 mEq/L (ref 135–145)
TCO2: 24 mmol/L (ref 0–100)

## 2010-08-17 LAB — URINALYSIS, ROUTINE W REFLEX MICROSCOPIC
Bilirubin Urine: NEGATIVE
Glucose, UA: NEGATIVE mg/dL
Hgb urine dipstick: NEGATIVE
Specific Gravity, Urine: 1.023 (ref 1.005–1.030)

## 2010-08-18 ENCOUNTER — Encounter (HOSPITAL_COMMUNITY): Payer: Self-pay | Admitting: Radiology

## 2010-08-18 ENCOUNTER — Inpatient Hospital Stay (HOSPITAL_COMMUNITY): Payer: Self-pay

## 2010-08-18 LAB — COMPREHENSIVE METABOLIC PANEL
Albumin: 3.4 g/dL — ABNORMAL LOW (ref 3.5–5.2)
Albumin: 4.2 g/dL (ref 3.5–5.2)
Alkaline Phosphatase: 83 U/L (ref 39–117)
Alkaline Phosphatase: 94 U/L (ref 39–117)
BUN: 5 mg/dL — ABNORMAL LOW (ref 6–23)
BUN: 11 mg/dL (ref 6–23)
CO2: 23 mEq/L (ref 19–32)
Chloride: 108 mEq/L (ref 96–112)
Chloride: 106 mEq/L (ref 96–112)
Creatinine, Ser: 0.8 mg/dL (ref 0.4–1.5)
GFR calc non Af Amer: >60 mL/min (ref >60)
GFR calc non Af Amer: >60 mL/min (ref >60)
Glucose, Bld: 86 mg/dL (ref 70–99)
Glucose, Bld: 81 mg/dL (ref 70–99)
Potassium: 3.4 mEq/L — ABNORMAL LOW (ref 3.5–5.1)
Potassium: 4 mEq/L (ref 3.5–5.1)
Total Bilirubin: 1.4 mg/dL — ABNORMAL HIGH (ref 0.3–1.2)
Total Bilirubin: 0.6 mg/dL (ref 0.3–1.2)

## 2010-08-18 LAB — CBC
HCT: 36 % — ABNORMAL LOW (ref 39.0–52.0)
HCT: 37.4 % — ABNORMAL LOW (ref 39.0–52.0)
HCT: 42 % (ref 39.0–52.0)
Hemoglobin: 12.2 g/dL — ABNORMAL LOW (ref 13.0–17.0)
Hemoglobin: 12.9 g/dL — ABNORMAL LOW (ref 13.0–17.0)
MCHC: 32.3 g/dL (ref 30.0–36.0)
MCV: 92.3 fL (ref 78.0–100.0)
MCV: 97.5 fL (ref 78.0–100.0)
MCV: 97.7 fL (ref 78.0–100.0)
Platelets: 170 10*3/uL (ref 150–400)
Platelets: 195 10*3/uL (ref 150–400)
RBC: 3.9 MIL/uL — ABNORMAL LOW (ref 4.22–5.81)
RDW: 12.2 % (ref 11.5–15.5)
WBC: 10.4 10*3/uL (ref 4.0–10.5)
WBC: 11.4 10*3/uL — ABNORMAL HIGH (ref 4.0–10.5)
WBC: 9.2 10*3/uL (ref 4.0–10.5)

## 2010-08-18 LAB — DIFFERENTIAL
Basophils Absolute: 0.1 10*3/uL (ref 0.0–0.1)
Basophils Relative: 1 % (ref 0–1)
Basophils Relative: 2 % — ABNORMAL HIGH (ref 0–1)
Eosinophils Absolute: 0.4 10*3/uL (ref 0.0–0.7)
Eosinophils Relative: 3 % (ref 0–5)
Lymphocytes Relative: 36 % (ref 12–46)
Lymphs Abs: 2.8 10*3/uL (ref 0.7–4.0)
Neutro Abs: 4.5 10*3/uL (ref 1.7–7.7)
Neutrophils Relative %: 49 % (ref 43–77)
Neutrophils Relative %: 62 % (ref 43–77)

## 2010-08-18 LAB — GLUCOSE, CAPILLARY
Glucose-Capillary: 65 mg/dL — ABNORMAL LOW (ref 70–99)
Glucose-Capillary: 93 mg/dL (ref 70–99)

## 2010-08-18 LAB — URINALYSIS, ROUTINE W REFLEX MICROSCOPIC
Glucose, UA: NEGATIVE mg/dL
Hgb urine dipstick: NEGATIVE
Ketones, ur: NEGATIVE mg/dL
Protein, ur: NEGATIVE mg/dL
pH: 6 (ref 5.0–8.0)

## 2010-08-18 LAB — RAPID URINE DRUG SCREEN, HOSP PERFORMED
Amphetamines: NOT DETECTED
Barbiturates: NOT DETECTED
Opiates: POSITIVE — AB

## 2010-08-18 LAB — LIPASE, BLOOD
Lipase: 218 U/L (ref 23–300)
Lipase: 81 U/L (ref 23–300)

## 2010-08-18 LAB — HEPATIC FUNCTION PANEL
Alkaline Phosphatase: 87 U/L (ref 39–117)
Indirect Bilirubin: 0.8 mg/dL (ref 0.3–0.9)
Total Protein: 7.6 g/dL (ref 6.0–8.3)

## 2010-08-18 LAB — LIPID PANEL
Cholesterol: 114 mg/dL (ref 0–200)
Total CHOL/HDL Ratio: 2.5 RATIO

## 2010-08-18 LAB — TSH: TSH: 1.722 u[IU]/mL (ref 0.350–4.500)

## 2010-08-18 LAB — MAGNESIUM: Magnesium: 1.8 mg/dL (ref 1.5–2.5)

## 2010-08-18 LAB — POCT CARDIAC MARKERS
CKMB, poc: 1.2 ng/mL (ref 1.0–8.0)
Myoglobin, poc: 112 ng/mL (ref 12–200)

## 2010-08-18 MED ORDER — IOHEXOL 300 MG/ML  SOLN: 80.0000 mL | Freq: Once | INTRAMUSCULAR | Status: AC | PRN

## 2010-08-19 LAB — CBC
HCT: 34.5 % — ABNORMAL LOW (ref 39.0–52.0)
HCT: 42.6 % (ref 39.0–52.0)
Hemoglobin: 13.7 g/dL (ref 13.0–17.0)
MCHC: 32.8 g/dL (ref 30.0–36.0)
MCV: 93.5 fL (ref 78.0–100.0)
Platelets: 145 10*3/uL — ABNORMAL LOW (ref 150–400)
RBC: 4.33 MIL/uL (ref 4.22–5.81)
RDW: 12.8 % (ref 11.5–15.5)
WBC: 8.6 10*3/uL (ref 4.0–10.5)

## 2010-08-19 LAB — HEPATITIS PANEL, ACUTE: Hep A IgM: NEGATIVE

## 2010-08-19 LAB — COMPREHENSIVE METABOLIC PANEL
ALT: 107 U/L — ABNORMAL HIGH (ref 0–53)
Alkaline Phosphatase: 89 U/L (ref 39–117)
BUN: 1 mg/dL — ABNORMAL LOW (ref 6–23)
BUN: 7 mg/dL (ref 6–23)
Calcium: 8.2 mg/dL — ABNORMAL LOW (ref 8.4–10.5)
Chloride: 105 mEq/L (ref 96–112)
GFR calc non Af Amer: >60 mL/min (ref >60)
Glucose, Bld: 100 mg/dL — ABNORMAL HIGH (ref 70–99)
Glucose, Bld: 98 mg/dL (ref 70–99)
Potassium: 4.4 mEq/L (ref 3.5–5.1)
Sodium: 142 mEq/L (ref 135–145)
Sodium: 140 mEq/L (ref 135–145)
Total Bilirubin: 0.9 mg/dL (ref 0.3–1.2)
Total Protein: 5.5 g/dL — ABNORMAL LOW (ref 6.0–8.3)

## 2010-08-19 LAB — DIFFERENTIAL
Basophils Absolute: 0.1 10*3/uL (ref 0.0–0.1)
Basophils Relative: 1 % (ref 0–1)
Eosinophils Absolute: 0.2 10*3/uL (ref 0.0–0.7)
Monocytes Absolute: 0.8 10*3/uL (ref 0.1–1.0)
Monocytes Relative: 9 % (ref 3–12)
Neutro Abs: 5.4 10*3/uL (ref 1.7–7.7)
Neutrophils Relative %: 63 % (ref 43–77)

## 2010-08-19 LAB — GLUCOSE, CAPILLARY
Glucose-Capillary: 100 mg/dL — ABNORMAL HIGH (ref 70–99)
Glucose-Capillary: 90 mg/dL (ref 70–99)
Glucose-Capillary: 97 mg/dL (ref 70–99)
Glucose-Capillary: 81 mg/dL (ref 70–99)
Glucose-Capillary: 87 mg/dL (ref 70–99)

## 2010-08-20 LAB — CBC
MCH: 31.8 pg (ref 26.0–34.0)
MCHC: 33.4 g/dL (ref 30.0–36.0)
MCV: 95.1 fL (ref 78.0–100.0)
Platelets: 127 10*3/uL — ABNORMAL LOW (ref 150–400)
RBC: 3.84 MIL/uL — ABNORMAL LOW (ref 4.22–5.81)

## 2010-08-20 LAB — BASIC METABOLIC PANEL
BUN: 1 mg/dL — ABNORMAL LOW (ref 6–23)
Calcium: 8.1 mg/dL — ABNORMAL LOW (ref 8.4–10.5)
Chloride: 110 mEq/L (ref 96–112)
Creatinine, Ser: 0.79 mg/dL (ref 0.4–1.5)
GFR calc Af Amer: >60 mL/min (ref >60)

## 2010-08-20 LAB — GLUCOSE, CAPILLARY
Glucose-Capillary: 111 mg/dL — ABNORMAL HIGH (ref 70–99)
Glucose-Capillary: 92 mg/dL (ref 70–99)

## 2010-08-20 LAB — MAGNESIUM: Magnesium: 2.1 mg/dL (ref 1.5–2.5)

## 2010-08-21 LAB — BASIC METABOLIC PANEL
BUN: 2 mg/dL — ABNORMAL LOW (ref 6–23)
CO2: 27 mEq/L (ref 19–32)
Chloride: 105 mEq/L (ref 96–112)
Creatinine, Ser: 0.68 mg/dL (ref 0.4–1.5)
Glucose, Bld: 88 mg/dL (ref 70–99)
Potassium: 3.8 mEq/L (ref 3.5–5.1)

## 2010-08-21 LAB — DIFFERENTIAL
Eosinophils Relative: 3 % (ref 0–5)
Lymphocytes Relative: 21 % (ref 12–46)
Lymphs Abs: 2.3 10*3/uL (ref 0.7–4.0)
Monocytes Relative: 8 % (ref 3–12)
Neutrophils Relative %: 67 % (ref 43–77)

## 2010-08-21 LAB — CBC
HCT: 37.6 % — ABNORMAL LOW (ref 39.0–52.0)
MCH: 31.1 pg (ref 26.0–34.0)
MCV: 94.9 fL (ref 78.0–100.0)
RBC: 3.96 MIL/uL — ABNORMAL LOW (ref 4.22–5.81)
WBC: 10.6 10*3/uL — ABNORMAL HIGH (ref 4.0–10.5)

## 2010-08-24 NOTE — Discharge Summary (Signed)
  [name], [name]              ACCOUNT NO.:  [id number]  MEDICAL RECORD NO.:  [id number]           PATIENT TYPE:  I  LOCATION:  5523                         FACILITY:  [facility]  PHYSICIAN:  Hartley Barefoot, MD    DATE OF BIRTH:  March 06, 1971  DATE OF ADMISSION:  08/17/2010 DATE OF DISCHARGE:  08/21/2010                        DISCHARGE SUMMARY - REFERRING   HISTORY:  The patient left against medical advice.  The patient left the floor and I was not able to see him when he left the floor.  He went outside to the street.  And then he returned to get his personal belongings.  I explained to the patient the risks of leaving against medical advice.  I explained to him that if he feels sick he can come back to the emergency department to be evaluated.  DISCHARGE DIAGNOSES: 1. Acute pancreatitis. 2. Transaminases. 3. Hypertension. 4. History of alcohol use.  DISCHARGE MEDICATIONS:  Given prescription for Vicodin #15.  STUDIES PERFORMED:  CT abdomen and pelvis showed acute pancreatitis.  HOSPITAL COURSE: 1. Acute pancreatitis.  The patient was initially admitted to regular     floor n.p.o.  Pain improved and he was started on clear diet.  He     decided to leave against medical advice. 2. Transaminases secondary to alcohol.  Liver function test,     decreasing. 3. Alcohol use.  Patient was on CIWA protocol, thiamine and folate.     He was receiving Ativan p.r.n. 4. Diarrhea, resolved.  C diff was negative.     Hartley Barefoot, MD     BR/MEDQ  D:  08/21/2010  T:  08/21/2010  Job:  [id number]  Electronically Signed by Hartley Barefoot MD on 08/24/2010 09:13:03 PM

## 2010-09-19 NOTE — H&P (Signed)
[name], [name]              ACCOUNT NO.:  [id number]  MEDICAL RECORD NO.:  [id number]           PATIENT TYPE:  E  LOCATION:  [facility]                         FACILITY:  [facility]  PHYSICIAN:  Eduard Clos, MDDATE OF BIRTH:  1971/02/08  DATE OF ADMISSION:  08/17/2010 DATE OF DISCHARGE:                             HISTORY & PHYSICAL   PRIMARY CARE PHYSICIAN:  [facility].  CHIEF COMPLAINT:  Abdominal pain.  HISTORY OF PRESENTING ILLNESS:  A 40 year old male with known history of remarkable asthma with history of pancreatitis, hypertension, and has been experienced some abdominal pain over the last 3 days.  Pain is mostly in the epigastric area, nonradiating, it is becoming worse and has had nausea, vomiting and also had one episode of diarrhea which is self-limiting.  In the ER, the patient was found to have mildly elevated lipase.  At this time, the patient has been admitted for flare up of his pancreatitis.  The patient denies any chest pain, shortness of breath.  Denies any dizziness or loss of consciousness.  Denies any focal deficit, headache, or visual symptoms.  The patient stated that he has not been drinking for last 3 weeks, but still smokes cigarettes, occasionally uses marijuana.  PAST MEDICAL HISTORY: 1. Hypertension. 2. Hyperlipidemia. 3. Chronic pain. 4. Asthma.  PAST SURGICAL HISTORY:  Has had facial surgery.  MEDICATIONS PRIOR TO ADMISSION: 1. Lisinopril 10 mg p.o. daily. 2. Percocet 5/325 mg one to two tablets q.8 h. p.r.n. for pain. 3. Gemfibrozil 600 mg one tablet twice daily. 4. Albuterol inhaler as needed.  FAMILY HISTORY:  Nobody in the family has pancreatitis and hypertension in his mom.  SOCIAL HISTORY:  The patient lives with his mom.  Smoke cigarette.  Used to drink lot of beer, he said he had not drunk for last 3 weeks.  He smokes marijuana.  Occasionally, he does cocaine, but stated that he had not done for the last many months  now.  The patient has been advised to quit smoking.  REVIEW OF SYSTEMS:  At per the history of presenting illness, nothing else significant.  ALLERGIES:  No known drug allergies.  PHYSICAL EXAMINATION:  GENERAL:  The patient examined at the bedside not in acute distress. VITAL SIGNS:  Blood pressure of 148/98, pulse is 100 per minute, temperature 99.1, respiration is 18 per minute, O2 sat 100%. HEENT:  Anicteric.  Mild congestion.  No facial asymmetry.  Tongue is midline. CHEST:  Bilateral air entry present.  No rhonchi.  No crepitation. HEART:  S1 and S2 heard. ABDOMEN:  Soft, mild tenderness in the epigastric area.  No guarding. No rigidity. CNS:  Awake, alert, and oriented to time, place, and person.  Moves upper and lower extremities, 5/5. EXTREMITIES:  Peripheral pulses felt.  No edema.  LABORATORY STUDIES:  CBC; WBC is 10.9, hemoglobin 16.3, hematocrit is 48, platelets 204.  Complete metabolic panel; sodium 138, potassium 4.4, chloride 105, glucose 105, BUN 13, creatinine 1.3, alkaline phosphatase 92, AST 171, ALT 155, total protein 7, albumin 4.  Amylase 117, lipase 114.  UA is negative.  ASSESSMENT: 1. Abdominal pain most likely recurrence  of his pancreatitis. 2. Dehydration with mild renal failure. 3. Abnormal LFTs. 4. History of hypertension. 5. History of hyperlipidemia.  PLAN: 1. At this time, we will admit the patient to medical floor. 2. For his abdominal pain, I am going to get a CT of abdomen and     pelvis.  At this time, the patient will be kept on n.p.o. except     medication and we will aggressively hydrate the patient.  We will     replace the patient on p.r.n. pain medication.  We will repeat his     LFTs and BMET in a.m. along with CRP levels. 3. Hypertension.  At this time, we will keep the patient on IV     hydralazine p.r.n.  The patient can take p.o. 4. For his asthma, we will continue his albuterol p.r.n. 5. For his hyperlipidemia, continue  gemfibrozil.  We will recheck a     fasting lipid panel. 6. We will also get a chest x-ray. 7. Further recommendation as condition evolves.     Eduard Clos, MD     ANK/MEDQ  D:  08/18/2010  T:  08/18/2010  Job:  [id number]  Electronically Signed by Midge Minium MD on 09/19/2010 08:06:05 AM

## 2010-09-21 NOTE — H&P (Signed)
[name], [name]              ACCOUNT NO.:  [id number]   MEDICAL RECORD NO.:  [id number]          PATIENT TYPE:  OBV   LOCATION:  4729                         FACILITY:  [facility]   PHYSICIAN:  Vania Rea, M.D. DATE OF BIRTH:  December 19, 1970   DATE OF ADMISSION:  02/02/2008  DATE OF DISCHARGE:                              HISTORY & PHYSICAL   PRIMARY CARE PHYSICIAN:  [facility].   CHIEF COMPLAINT:  Chest pain on and off for 3-4 days.   HISTORY OF PRESENT ILLNESS:  [name] with a history of asthma and polysubstance abuse who was in  his baseline state of health until a few days ago when he started having  sharp left-sided chest pain associated with headache, nausea,  diaphoresis and radiating down into his left arm.  The patient said he  tried self remedies, including the use of Goody's powders without much  improvement, and eventually came to the emergency room this night and  received sublingual nitroglycerin which caused some resolution of the  pain, and he is feeling better with the nitro paste topical application.  The patient is very anxious because he has a grandmother with coronary  artery disease.  Neither of his parents have heart disease.  He has a  history of a congenital heart murmur.  He does smoke 1 pack per day.  He  does drink two 24 ounce beers per day.  He does smoke marijuana on the  weekends.  His last cocaine use was about 4 months ago.  The patient  denies any orthopnea, PND or lower extremity edema.  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  He notes that he was  having a strange feeling in his chest on twisting a couple weeks ago and  wondering if this is a result of that.   PAST MEDICAL HISTORY:  1. Asthma.  2. He reports a past history of kidney stones.  3. Computer records indicate that he has a history of  pancreatitis.   MEDICATIONS:  1. Albuterol MDI p.r.n.  2. Amoxil 500 mg three times daily, currently taking a 10 day course      for a dental abscess.   ALLERGIES:  NO KNOWN DRUG ALLERGIES.   SOCIAL HISTORY:  He is a Company secretary.  Tobacco, alcohol and drug  use as noted above.   FAMILY HISTORY:  There is no immediate family history of heart disease,  diabetes or hypertension.   REVIEW OF SYSTEMS:  Other than noted above, significant only for  toothache resolving with Amoxil.  He had his cholesterol checked about  two years ago and it was fine.  He has had a persistent dry cough for  about 5-6 days now.  No fever.  He has been having for the past couple  of weeks a pain radiating from his left hip to his left knee.   PHYSICAL EXAMINATION:  GENERAL:  A pleasant healthy looking young  African American gentleman sitting up  in bed eating a hamburger in no  acute distress.  VITAL SIGNS:  His temperature is 98.4, pulse 68, respirations 18, blood  pressure 104/67.  He is saturating at 99% on 2 liters.  HEENT:  His pupils are round, equal and reactive.  Mucous membranes are  pink and anicteric.  He is not dehydrated.  He has multiple carious  teeth.  NECK:  No jugulovenous distention, no carotid bruits.  CHEST:  Clear to auscultation bilaterally.  CARDIOVASCULAR:  Regular rhythm.  No murmur.  ABDOMEN:  Scaphoid, soft and nontender.  There are no masses.  He has  normal abdominal bowel sounds.  EXTREMITIES:  Without edema.  He has 2+ pulses bilaterally.  CENTRAL NERVOUS SYSTEM:  Cranial nerves II through XII are grossly  intact.  He has no focal neurologic deficit.   LABORATORY DATA:  His CBC is unremarkable.  His white count is  unremarkable.  His cardiac enzymes are completely normal with a  myoglobin of 41, CPK-MB of 1.3 and undetectable troponins.  His chest x-  ray shows no acute abnormalities.  His EKG shows normal sinus rhythm  with asymmetric T-wave inversion in the  inferior leads.   ASSESSMENT:  Unstable angina.   PLAN:  Will admit this gentleman on a rule out protocol.  He has just  eaten, but he is probably not a candidate for a stress test right now,  but will continue to cycle enzymes.  Will continue pain medication.  Will go ahead and check a urine drug screen.      Vania Rea, M.D.  Electronically Signed     LC/MEDQ  D:  02/02/2008  T:  02/02/2008  Job:  [id number]

## 2010-09-21 NOTE — Consult Note (Signed)
NAMEPRANIT, OWENSBY              ACCOUNT NO.:  [id number]   MEDICAL RECORD NO.:  [id number]          PATIENT TYPE:  OBV   LOCATION:  4739                         FACILITY:  [facility]   PHYSICIAN:  Antonietta Breach, M.D.  DATE OF BIRTH:  10-06-1970   DATE OF CONSULTATION:  02/04/2008  DATE OF DISCHARGE:  02/04/2008                                 CONSULTATION   REQUESTING PHYSICIAN:  [facility].   REASON FOR CONSULTATION:  Involves polysubstance dependence.   HISTORY OF PRESENT ILLNESS:  Mr. Mcelreath is a 40 year old male admitted  to the [facility] on February 01, 2008, with chest pain and he  is undergoing a cardiac workup.   Mr. Helms did become irritable with the staff over the issue of not  being able to smoke in the hospital or on campus.  He is not agitated or  combative.  He does not have any thoughts of harming himself or others.  He has no hallucinations or delusions.   He has no racing thoughts or pressured speech.  He is cooperative and  redirectable.  His mother has come to visit him in the hospital, they  have had supportive talking and he is now resolved to follow the  hospital rules.   He has intact interests and future hope.   PAST PSYCHIATRIC HISTORY:  Mr. Lingenfelter does have a history of  polysubstance abuse.  He has been drinking 24 ounces of beer per day.  He also uses marijuana.  He stopped using cocaine 4 months ago.  He has  no history of mania.  He has had some insomnia in the past which was  treated with Seroquel.   PAST MEDICAL HISTORY:  Asthma and chest pain.   MENTAL STATUS EXAM:  Mr. Micucci is alert, his eye contact is good.  He  is oriented to all spheres.  His concentration is within normal limits.  Affect is broad and appropriate.  Mood within normal limits.  Memory is  intact to immediate, recent and remote.  Fund of knowledge and  intelligence within normal limits.  Speech within normal limits.  Thought process logical, coherent,  goal-directed.  No looseness of  associations.  Thought content no thoughts of harming himself, no  thoughts of harming others, no delusions, no hallucinations.  Insight is  intact, judgment is intact.   ASSESSMENT:  AXIS I:  (1)  Adjustment disorder with mixed disturbance of  emotions and conduct, now resolved.  (2)  Anxiety disorder not otherwise  specified 293.84, this is utilized to indicate that he has had worry and  insomnia in the past.  He also continues with insomnia.  (3)  Polysubstance dependence.  AXIS II:  Deferred.  AXIS III:  See past medical history.  AXIS IV:  General medical, primary support group.  AXIS V:  55.   Mr. Vivero is not at risk to harm himself or others.  He agrees to call  emergency services immediately for any thoughts of harming himself,  thoughts of harming others or distress.   The undersigned provided ego-supportive psychotherapy and reinforcement  of  12-steps.   The indications, alternatives and adverse effects of Restoril for anti-  acute insomnia were discussed with the patient including the risk of  dependence.  He understands and would like to start Restoril for a short  course until he can have further assessment as to the nature of the  insomnia after he has had time to adjust from his substance abuse.  He  agrees to not drive if drowsy, would start Restoril 15 mg nightly p.r.n.   RECOMMENDATION:  Mr. Kolbe is motivated for a chemical dependency  rehabilitation program.  He would like to be on the list for [facility]  when a chemical dependency inpatient stay becomes available.   He also will continue with 12-step meetings.      Antonietta Breach, M.D.  Electronically Signed     JW/MEDQ  D:  02/05/2008  T:  02/05/2008  Job:  [id number]

## 2010-10-02 ENCOUNTER — Emergency Department (HOSPITAL_COMMUNITY)
Admission: EM | Admit: 2010-10-02 | Discharge: 2010-10-03 | Disposition: A | Discharge status: Home / Self Care | Payer: Self-pay | Attending: Emergency Medicine | Admitting: Emergency Medicine

## 2010-10-02 DIAGNOSIS — R197 Diarrhea, unspecified: Secondary | ICD-10-CM | POA: Insufficient documentation

## 2010-10-02 DIAGNOSIS — J45909 Unspecified asthma, uncomplicated: Secondary | ICD-10-CM | POA: Insufficient documentation

## 2010-10-02 DIAGNOSIS — K2920 Alcoholic gastritis without bleeding: Secondary | ICD-10-CM | POA: Insufficient documentation

## 2010-10-02 DIAGNOSIS — R112 Nausea with vomiting, unspecified: Secondary | ICD-10-CM | POA: Insufficient documentation

## 2010-10-02 DIAGNOSIS — G8929 Other chronic pain: Secondary | ICD-10-CM | POA: Insufficient documentation

## 2010-10-02 DIAGNOSIS — R1013 Epigastric pain: Secondary | ICD-10-CM | POA: Insufficient documentation

## 2010-10-02 DIAGNOSIS — I10 Essential (primary) hypertension: Secondary | ICD-10-CM | POA: Insufficient documentation

## 2010-10-02 DIAGNOSIS — K861 Other chronic pancreatitis: Secondary | ICD-10-CM | POA: Insufficient documentation

## 2010-10-02 LAB — DIFFERENTIAL
Basophils Absolute: 0 10*3/uL (ref 0.0–0.1)
Eosinophils Relative: 1 % (ref 0–5)
Lymphocytes Relative: 31 % (ref 12–46)
Lymphs Abs: 2.5 10*3/uL (ref 0.7–4.0)
Monocytes Absolute: 0.8 10*3/uL (ref 0.1–1.0)
Monocytes Relative: 10 % (ref 3–12)
Neutro Abs: 4.8 10*3/uL (ref 1.7–7.7)

## 2010-10-02 LAB — COMPREHENSIVE METABOLIC PANEL
ALT: 51 U/L (ref 0–53)
BUN: 7 mg/dL (ref 6–23)
CO2: 26 mEq/L (ref 19–32)
Calcium: 9.5 mg/dL (ref 8.4–10.5)
GFR calc non Af Amer: >60 mL/min (ref >60)
Glucose, Bld: 78 mg/dL (ref 70–99)
Sodium: 137 mEq/L (ref 135–145)

## 2010-10-02 LAB — URINALYSIS, ROUTINE W REFLEX MICROSCOPIC
Bilirubin Urine: NEGATIVE
Hgb urine dipstick: NEGATIVE
Protein, ur: NEGATIVE mg/dL
Urobilinogen, UA: 0.2 mg/dL (ref 0.0–1.0)

## 2010-10-02 LAB — ETHANOL: Alcohol, Ethyl (B): 41 mg/dL — ABNORMAL HIGH (ref 0–10)

## 2010-10-02 LAB — RAPID URINE DRUG SCREEN, HOSP PERFORMED
Amphetamines: NOT DETECTED
Barbiturates: NOT DETECTED
Tetrahydrocannabinol: NOT DETECTED

## 2010-10-02 LAB — CBC
HCT: 41.8 % (ref 39.0–52.0)
Hemoglobin: 14.7 g/dL (ref 13.0–17.0)
MCH: 32.2 pg (ref 26.0–34.0)
MCHC: 35.2 g/dL (ref 30.0–36.0)
MCV: 91.5 fL (ref 78.0–100.0)

## 2010-10-02 LAB — LIPASE, BLOOD: Lipase: 28 U/L (ref 11–59)

## 2011-02-03 IMAGING — CR DG CHEST 2V
2 series · 2 of 2 positions shown · non-contrast
Comparison: 02/01/2008

CLINICAL DATA: Abdominal pain.  Right-sided chest pain

CHEST - 2 VIEW

[w chest pa]
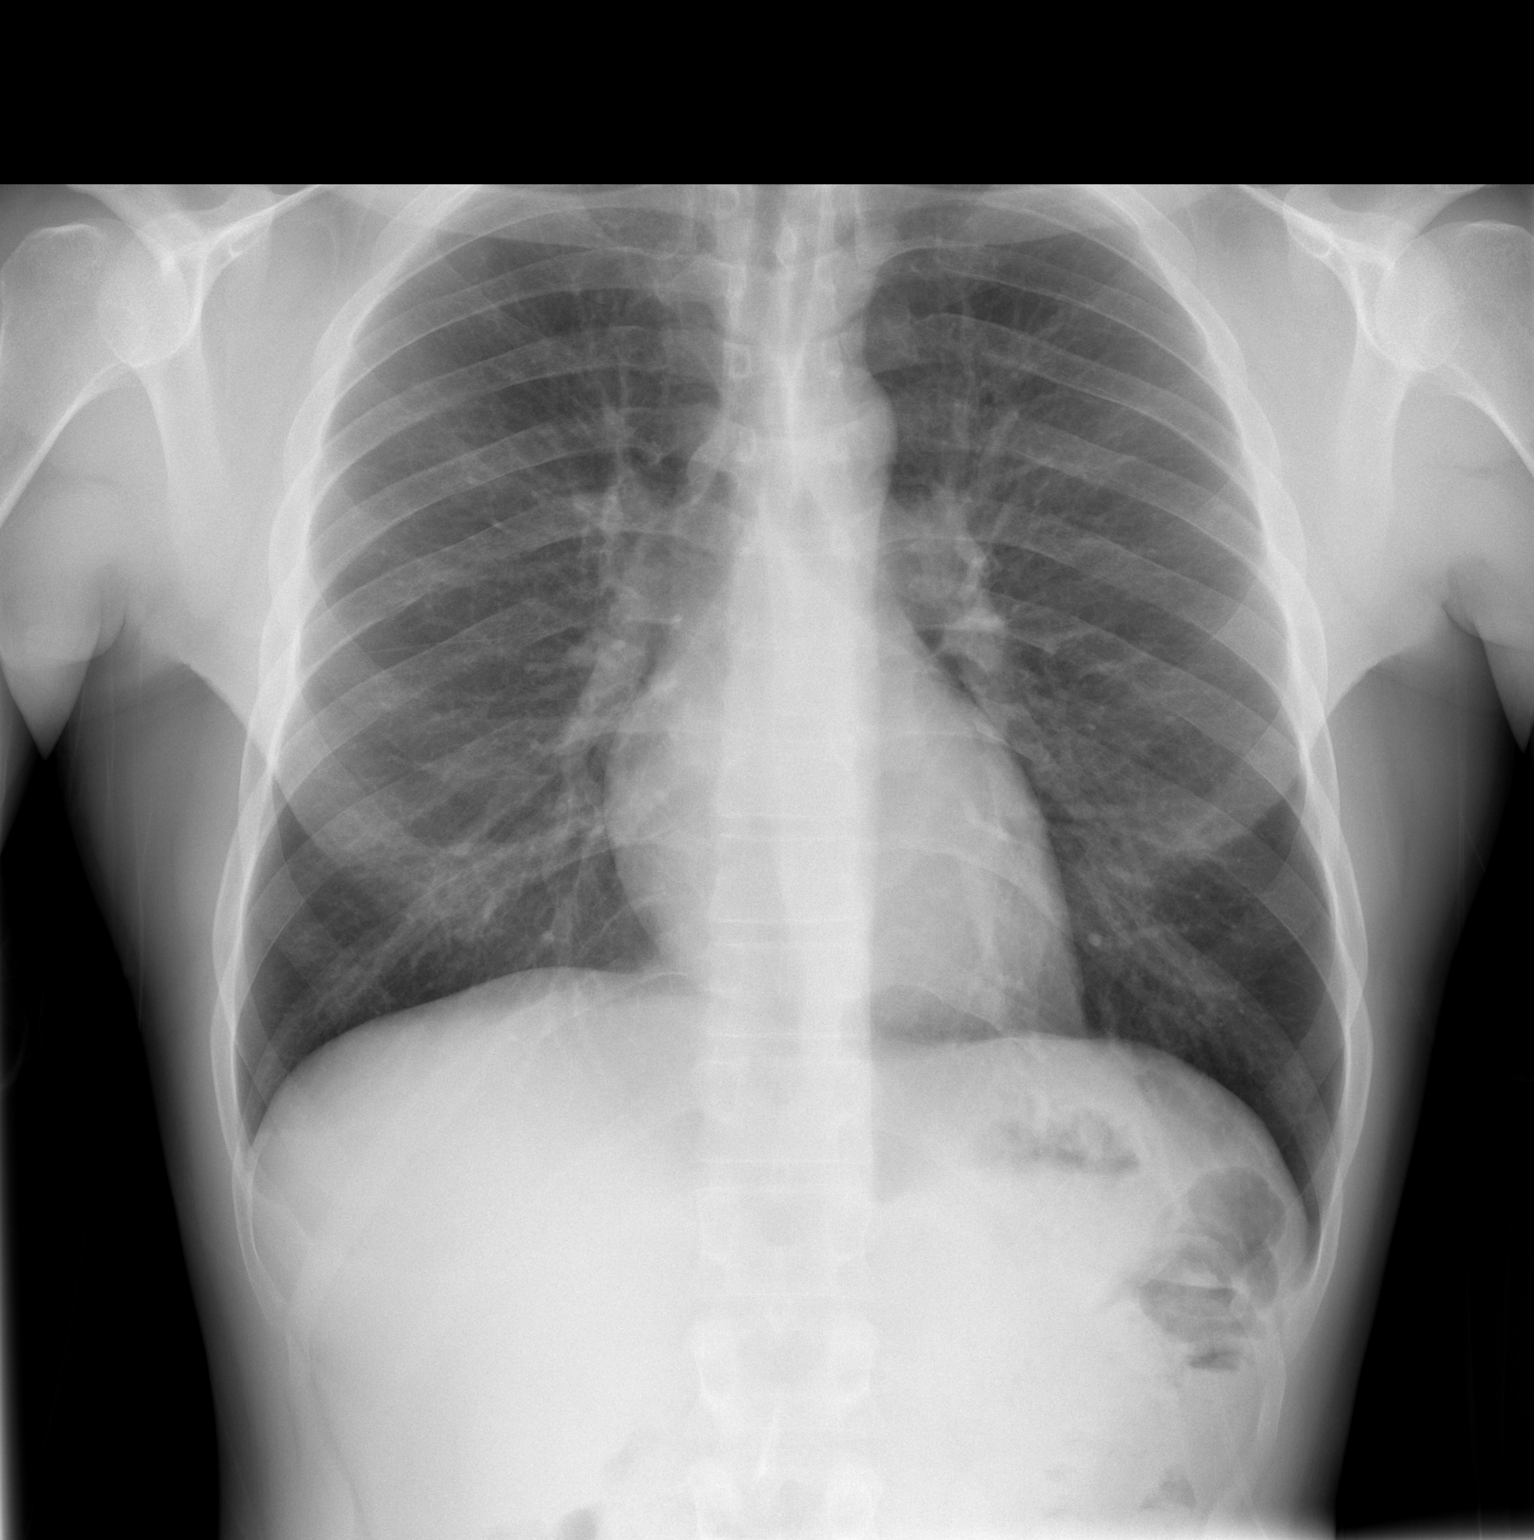

[w chest lat]
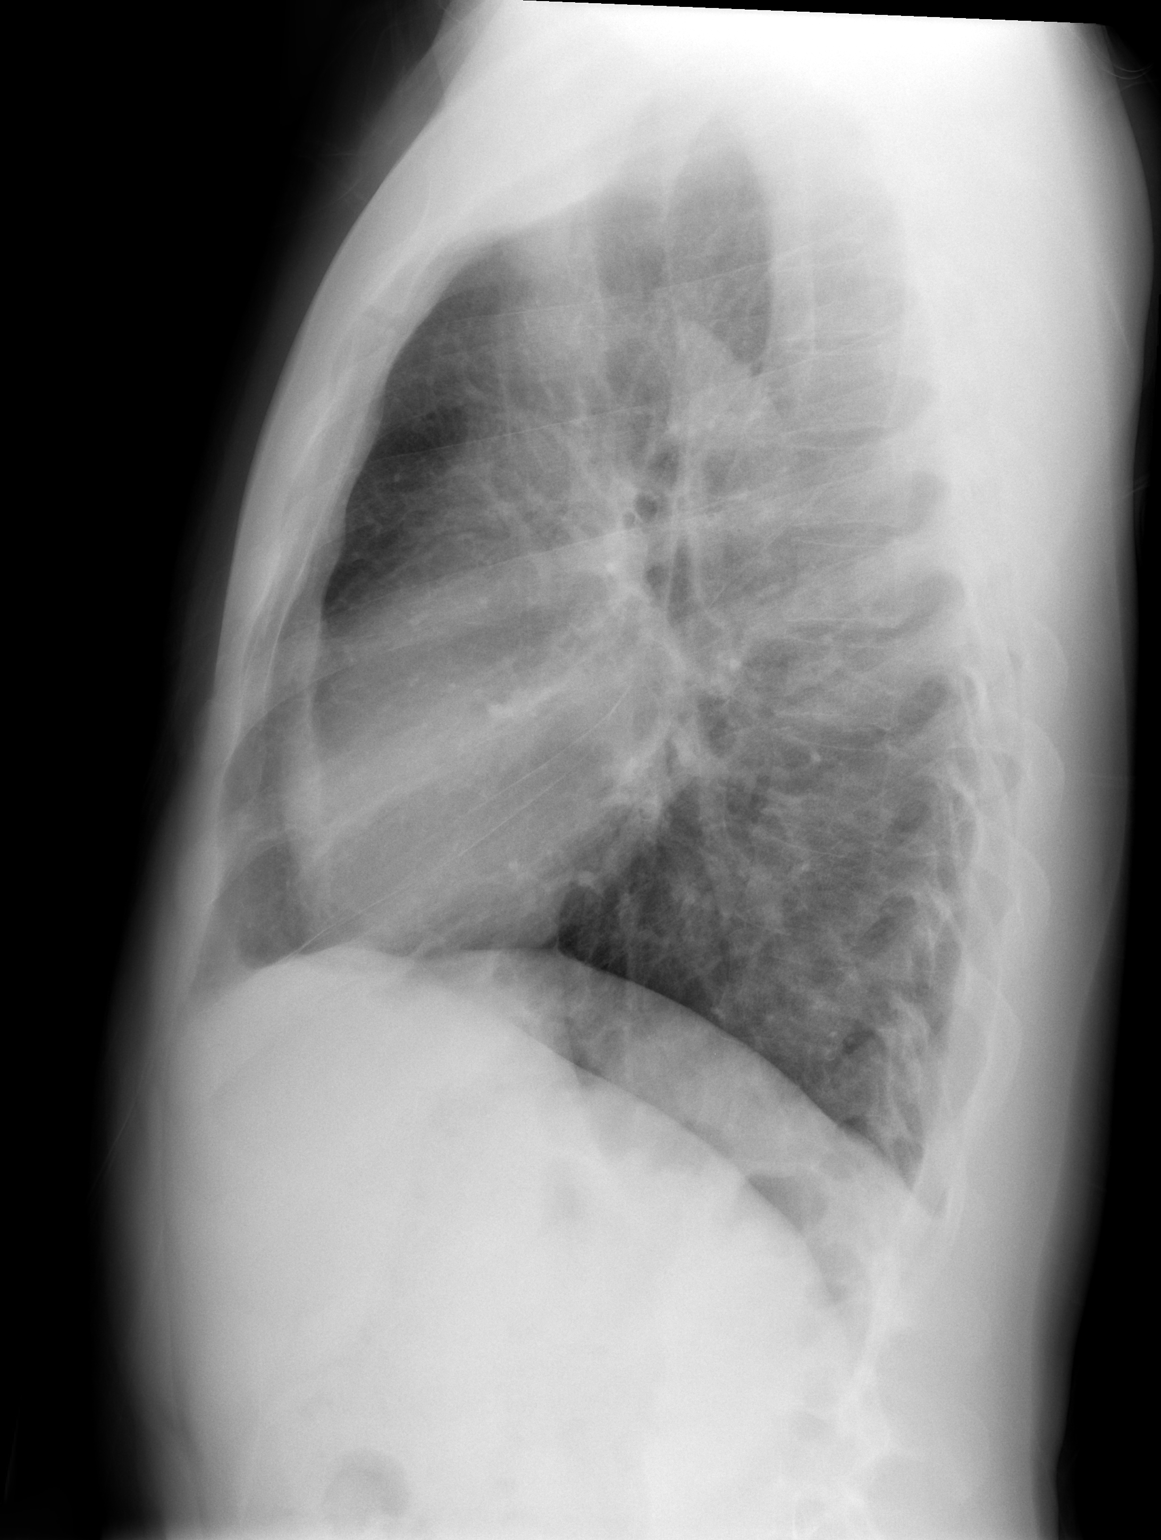

[2 of 2 positions shown; findings below may reference images not displayed]

FINDINGS: Heart size is normal.

No pleural effusions or pulmonary edema.

No airspace densities identified.

Review of the visualized osseous structures is unremarkable.
IMPRESSION: 1.  No acute cardiopulmonary abnormalities.

## 2011-02-07 LAB — CBC
Hemoglobin: 14.4
MCHC: 33.1
MCV: 98.3
Platelets: 188
RBC: 3.98 — ABNORMAL LOW
RBC: 4.47
RDW: 12.6

## 2011-02-07 LAB — DIFFERENTIAL
Lymphocytes Relative: 33
Lymphs Abs: 3
Monocytes Absolute: 0.6
Monocytes Relative: 7
Neutro Abs: 5.3

## 2011-02-07 LAB — CARDIAC PANEL(CRET KIN+CKTOT+MB+TROPI)
Relative Index: 1.2
Total CK: 135
Total CK: 147
Troponin I: <0.01
Troponin I: <0.01

## 2011-02-07 LAB — COMPREHENSIVE METABOLIC PANEL
AST: 45 — ABNORMAL HIGH
Albumin: 3.7
Alkaline Phosphatase: 69
BUN: 12
Chloride: 100
Creatinine, Ser: 0.87
GFR calc Af Amer: >60
Potassium: 4
Total Bilirubin: 1.3 — ABNORMAL HIGH
Total Protein: 6.5

## 2011-02-07 LAB — LIPID PANEL
Cholesterol: 170
LDL Cholesterol: 73
Total CHOL/HDL Ratio: 2.6

## 2011-02-07 LAB — POCT I-STAT, CHEM 8
Calcium, Ion: 1.21
Hemoglobin: 16.3
Sodium: 139
TCO2: 28

## 2011-02-07 LAB — POCT CARDIAC MARKERS
CKMB, poc: 1.3
Myoglobin, poc: 41.3

## 2011-02-07 LAB — DRUGS OF ABUSE SCREEN W/O ALC, ROUTINE URINE
Amphetamine Screen, Ur: NEGATIVE
Barbiturate Quant, Ur: NEGATIVE
Cocaine Metabolites: NEGATIVE
Marijuana Metabolite: POSITIVE — AB
Methadone: NEGATIVE

## 2011-02-07 LAB — BASIC METABOLIC PANEL
BUN: 7
CO2: 24
Calcium: 9.2
Chloride: 106
Creatinine, Ser: 0.72
Glucose, Bld: 99

## 2011-02-07 LAB — HOMOCYSTEINE: Homocysteine: 9.6

## 2011-02-07 LAB — THC (MARIJUANA), URINE, CONFIRMATION: Marijuana, Ur-Confirmation: 41 ng/mL

## 2011-02-07 LAB — TSH: TSH: 2.133

## 2011-02-22 LAB — CBC
Hemoglobin: 10 — ABNORMAL LOW
MCV: 98.3
RBC: 3.08 — ABNORMAL LOW
WBC: 9.5

## 2011-02-22 LAB — RAPID URINE DRUG SCREEN, HOSP PERFORMED
Benzodiazepines: NOT DETECTED
Cocaine: NOT DETECTED
Opiates: NOT DETECTED
Tetrahydrocannabinol: POSITIVE — AB

## 2011-02-22 LAB — COMPREHENSIVE METABOLIC PANEL
ALT: 85 — ABNORMAL HIGH
AST: 81 — ABNORMAL HIGH
CO2: 25
Chloride: 105
GFR calc Af Amer: >60
GFR calc non Af Amer: >60
Glucose, Bld: 68 — ABNORMAL LOW
Sodium: 137
Total Bilirubin: 0.8

## 2011-02-22 LAB — I-STAT 8, (EC8 V) (CONVERTED LAB)
BUN: 8
Bicarbonate: 22.6
Glucose, Bld: 63 — ABNORMAL LOW
Hemoglobin: 11.9 — ABNORMAL LOW
Sodium: 137
pH, Ven: 7.331 — ABNORMAL HIGH

## 2011-02-22 LAB — LIPASE, BLOOD: Lipase: 56

## 2011-02-22 LAB — DIFFERENTIAL
Basophils Absolute: 0
Basophils Relative: 0
Eosinophils Absolute: 0.2
Eosinophils Relative: 2

## 2011-02-27 ENCOUNTER — Emergency Department (HOSPITAL_COMMUNITY)
Admission: EM | Admit: 2011-02-27 | Discharge: 2011-02-27 | Disposition: A | Discharge status: Home / Self Care | Payer: Self-pay | Attending: Emergency Medicine | Admitting: Emergency Medicine

## 2011-02-27 DIAGNOSIS — G8929 Other chronic pain: Secondary | ICD-10-CM | POA: Insufficient documentation

## 2011-02-27 DIAGNOSIS — IMO0001 Reserved for inherently not codable concepts without codable children: Secondary | ICD-10-CM | POA: Insufficient documentation

## 2011-02-27 DIAGNOSIS — I10 Essential (primary) hypertension: Secondary | ICD-10-CM | POA: Insufficient documentation

## 2011-02-27 DIAGNOSIS — R509 Fever, unspecified: Secondary | ICD-10-CM | POA: Insufficient documentation

## 2011-02-27 DIAGNOSIS — R51 Headache: Secondary | ICD-10-CM | POA: Insufficient documentation

## 2011-02-27 DIAGNOSIS — J029 Acute pharyngitis, unspecified: Secondary | ICD-10-CM | POA: Insufficient documentation

## 2011-03-13 ENCOUNTER — Emergency Department (HOSPITAL_COMMUNITY): Payer: Self-pay

## 2011-03-13 ENCOUNTER — Encounter (HOSPITAL_COMMUNITY): Payer: Self-pay

## 2011-03-13 ENCOUNTER — Emergency Department (HOSPITAL_COMMUNITY)
Admission: EM | Admit: 2011-03-13 | Discharge: 2011-03-13 | Disposition: A | Discharge status: Home / Self Care | Payer: Self-pay | Attending: Emergency Medicine | Admitting: Emergency Medicine

## 2011-03-13 DIAGNOSIS — F17200 Nicotine dependence, unspecified, uncomplicated: Secondary | ICD-10-CM | POA: Insufficient documentation

## 2011-03-13 DIAGNOSIS — I10 Essential (primary) hypertension: Secondary | ICD-10-CM | POA: Insufficient documentation

## 2011-03-13 DIAGNOSIS — M542 Cervicalgia: Secondary | ICD-10-CM | POA: Insufficient documentation

## 2011-03-13 DIAGNOSIS — R071 Chest pain on breathing: Secondary | ICD-10-CM | POA: Insufficient documentation

## 2011-03-13 DIAGNOSIS — IMO0002 Reserved for concepts with insufficient information to code with codable children: Secondary | ICD-10-CM | POA: Insufficient documentation

## 2011-03-13 DIAGNOSIS — J45909 Unspecified asthma, uncomplicated: Secondary | ICD-10-CM | POA: Insufficient documentation

## 2011-03-13 DIAGNOSIS — R51 Headache: Secondary | ICD-10-CM | POA: Insufficient documentation

## 2011-03-13 LAB — COMPREHENSIVE METABOLIC PANEL
ALT: 80 U/L — ABNORMAL HIGH (ref 0–53)
AST: 79 U/L — ABNORMAL HIGH (ref 0–37)
Albumin: 3.9 g/dL (ref 3.5–5.2)
Alkaline Phosphatase: 113 U/L (ref 39–117)
CO2: 21 mEq/L (ref 19–32)
Chloride: 100 mEq/L (ref 96–112)
Creatinine, Ser: 0.98 mg/dL (ref 0.50–1.35)
GFR calc non Af Amer: >90 mL/min (ref >90)
Potassium: 3.8 mEq/L (ref 3.5–5.1)
Total Bilirubin: 0.3 mg/dL (ref 0.3–1.2)

## 2011-03-13 LAB — URINALYSIS, ROUTINE W REFLEX MICROSCOPIC
Bilirubin Urine: NEGATIVE
Glucose, UA: NEGATIVE mg/dL
Hgb urine dipstick: NEGATIVE
Ketones, ur: NEGATIVE mg/dL
Protein, ur: NEGATIVE mg/dL
Urobilinogen, UA: 0.2 mg/dL (ref 0.0–1.0)

## 2011-03-13 LAB — CBC
MCV: 96.8 fL (ref 78.0–100.0)
Platelets: 235 10*3/uL (ref 150–400)
RBC: 4 MIL/uL — ABNORMAL LOW (ref 4.22–5.81)
RDW: 13.4 % (ref 11.5–15.5)
WBC: 12.6 10*3/uL — ABNORMAL HIGH (ref 4.0–10.5)

## 2011-03-13 MED ORDER — OXYCODONE-ACETAMINOPHEN 5-325 MG PO TABS: 2.0000 | ORAL_TABLET | ORAL | Status: AC | PRN

## 2011-03-13 MED ORDER — ONDANSETRON 4 MG PO TBDP
8.0000 mg | ORAL_TABLET | Freq: Once | ORAL | Status: AC
  Administered 2011-03-13: 8 mg via ORAL
  Filled 2011-03-13: qty 2

## 2011-03-13 MED ORDER — OXYCODONE-ACETAMINOPHEN 5-325 MG PO TABS
1.0000 | ORAL_TABLET | Freq: Once | ORAL | Status: AC
  Administered 2011-03-13: 1 via ORAL
  Filled 2011-03-13: qty 1
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
## 2011-03-13 NOTE — ED Notes (Signed)
Report given to Woodroe Chen, RN.  Pt transported to CDU.
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
## 2011-05-02 IMAGING — CR DG CERVICAL SPINE COMPLETE 4+V
5 series · 5 of 5 positions shown · non-contrast
Comparison: None

CLINICAL DATA: Fell on [REDACTED] with neck pain

CERVICAL SPINE - COMPLETE 4+ VIEW

[view not recorded (1 of 5)]
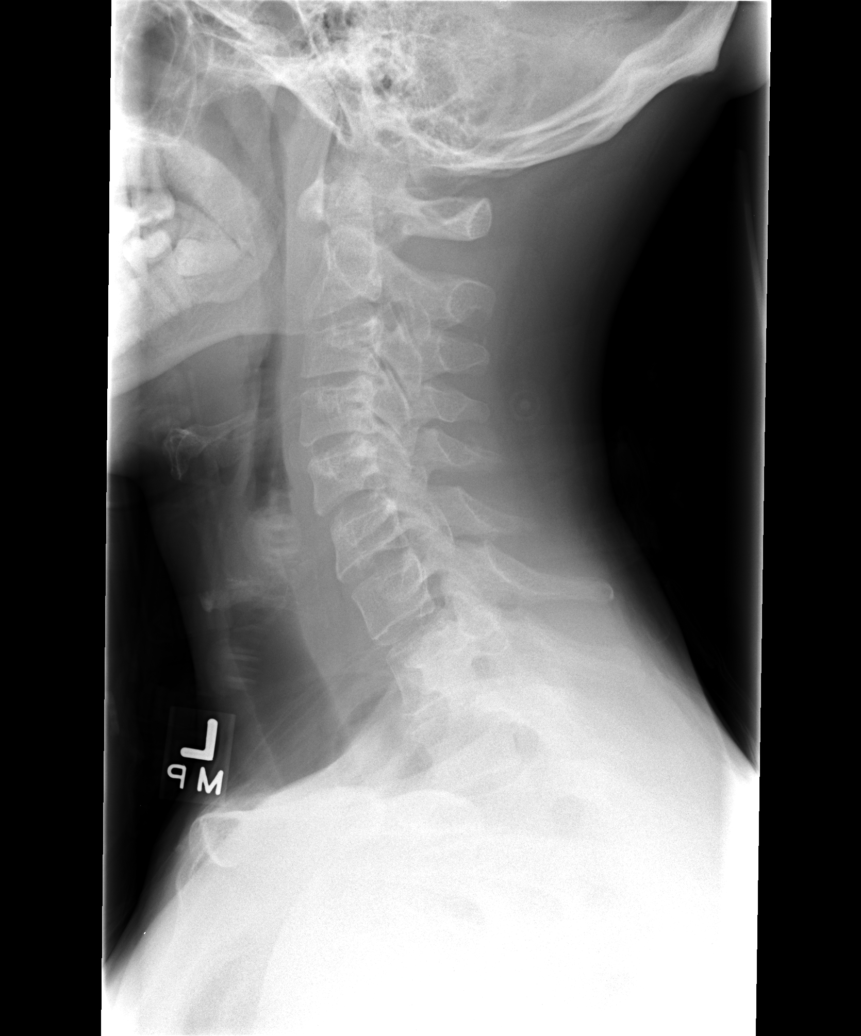

[view not recorded (2 of 5)]
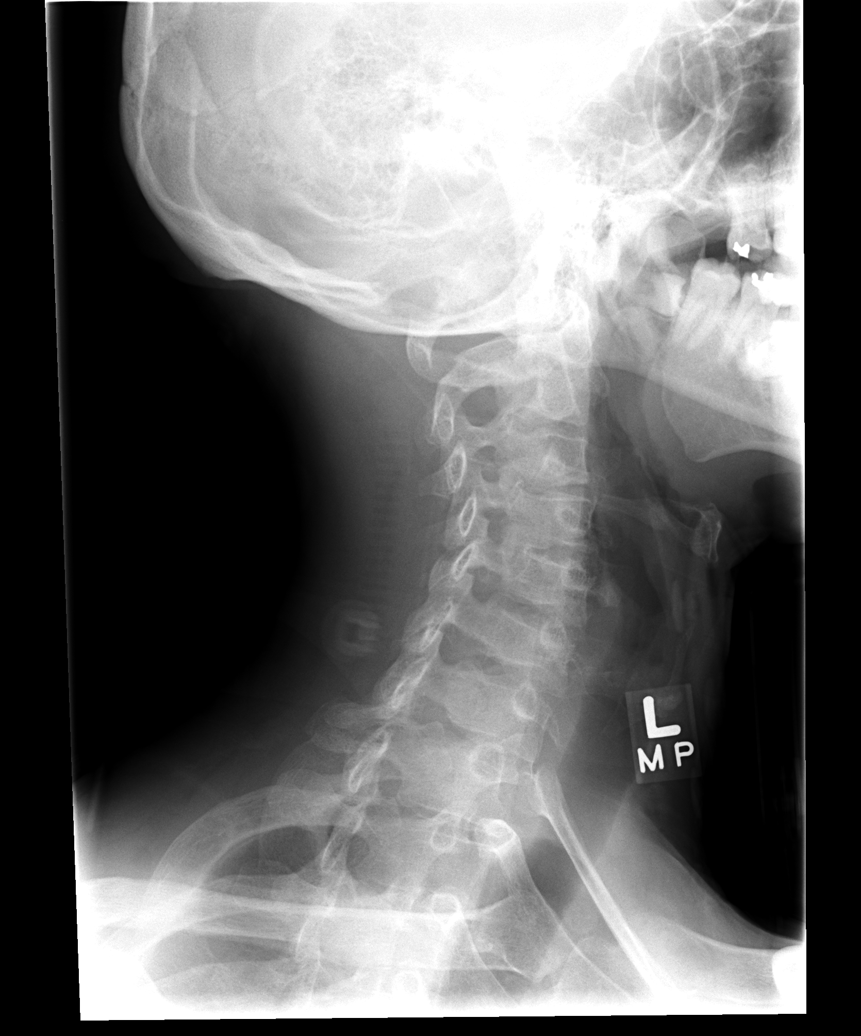

[view not recorded (3 of 5)]
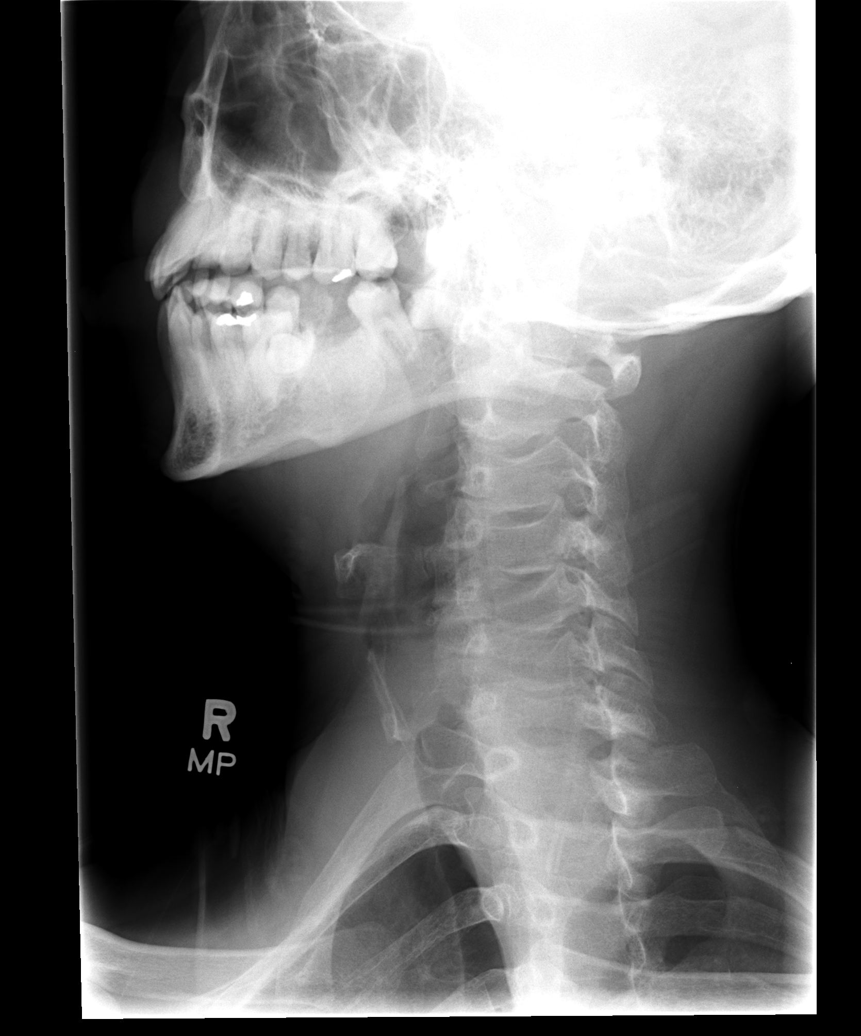

[view not recorded (4 of 5)]
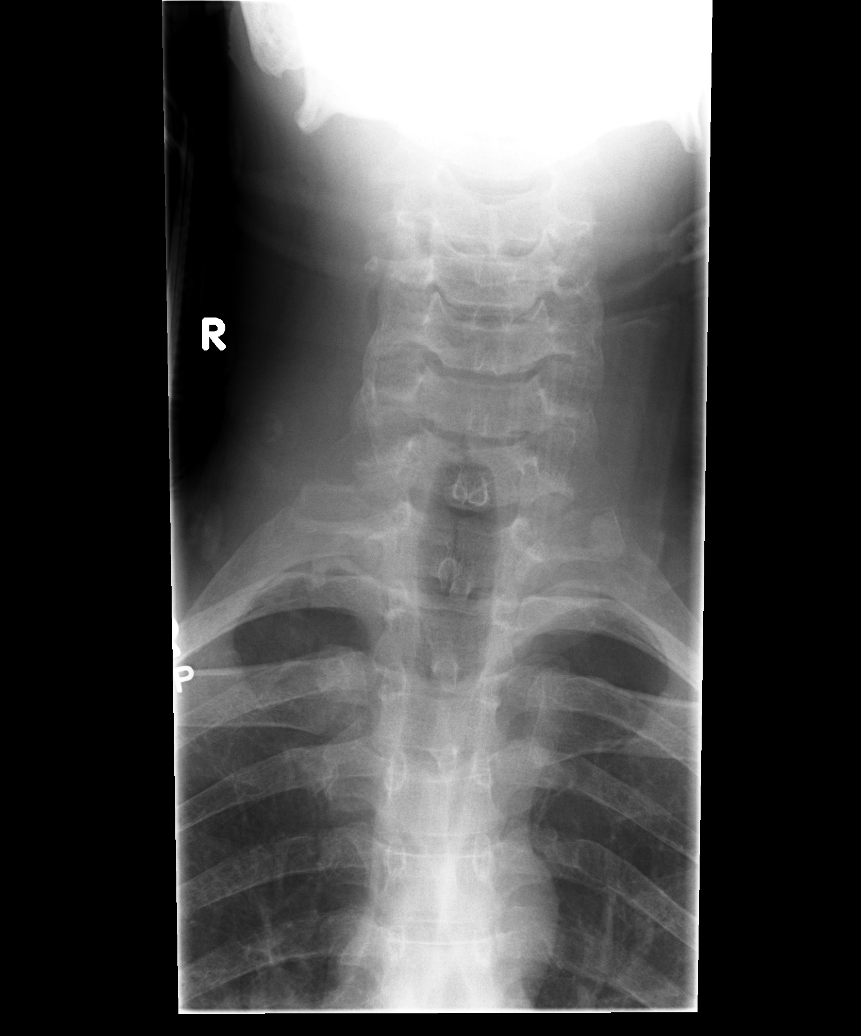

[view not recorded (5 of 5)]
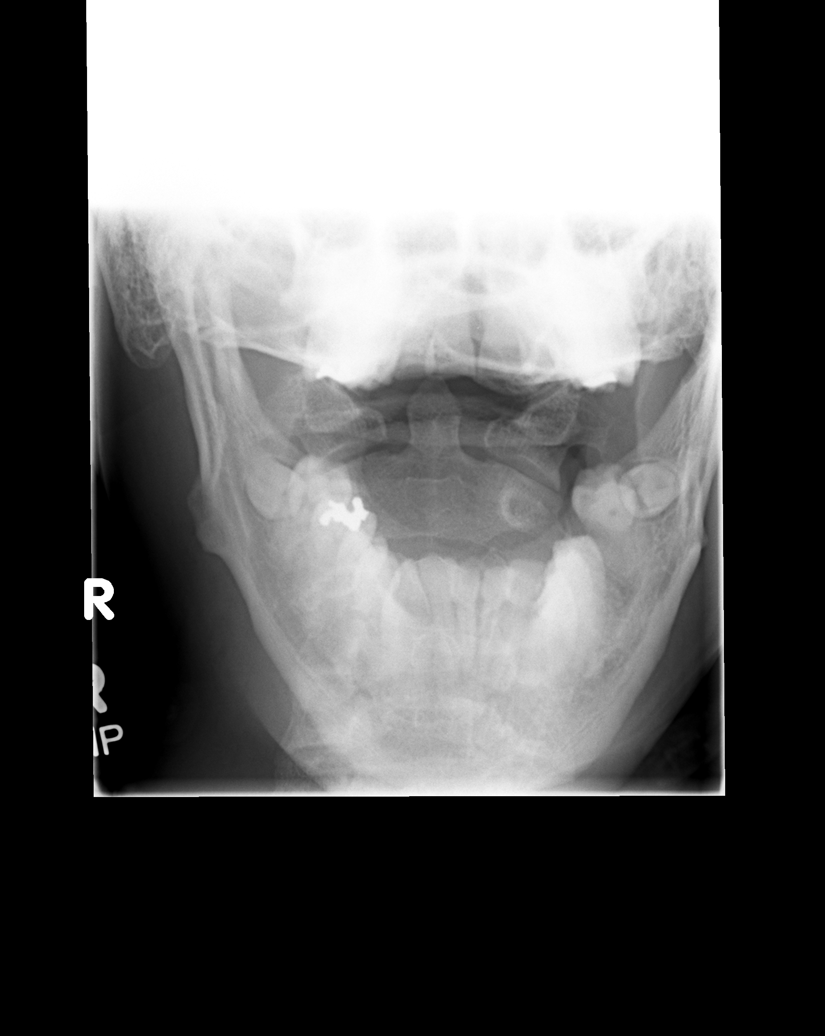

[5 of 5 positions shown; findings below may reference images not displayed]

FINDINGS: The cervical vertebrae are in normal alignment with
normal disc spaces.  No prevertebral soft tissue swelling is seen.
On oblique views no significant foraminal narrowing is noted.  The
odontoid process is intact.
IMPRESSION: Normal alignment with normal disc spaces.  No acute bony
abnormality.

## 2011-07-01 ENCOUNTER — Emergency Department (HOSPITAL_COMMUNITY)
Admission: EM | Admit: 2011-07-01 | Discharge: 2011-07-01 | Disposition: A | Discharge status: Home / Self Care | Payer: Self-pay | Attending: Emergency Medicine | Admitting: Emergency Medicine

## 2011-07-01 ENCOUNTER — Encounter (HOSPITAL_COMMUNITY): Payer: Self-pay

## 2011-07-01 ENCOUNTER — Emergency Department (HOSPITAL_COMMUNITY): Payer: Self-pay

## 2011-07-01 DIAGNOSIS — R7401 Elevation of levels of liver transaminase levels: Secondary | ICD-10-CM | POA: Insufficient documentation

## 2011-07-01 DIAGNOSIS — R109 Unspecified abdominal pain: Secondary | ICD-10-CM | POA: Insufficient documentation

## 2011-07-01 DIAGNOSIS — R197 Diarrhea, unspecified: Secondary | ICD-10-CM | POA: Insufficient documentation

## 2011-07-01 DIAGNOSIS — R7402 Elevation of levels of lactic acid dehydrogenase (LDH): Secondary | ICD-10-CM | POA: Insufficient documentation

## 2011-07-01 DIAGNOSIS — K2970 Gastritis, unspecified, without bleeding: Secondary | ICD-10-CM | POA: Insufficient documentation

## 2011-07-01 DIAGNOSIS — R000 Tachycardia, unspecified: Secondary | ICD-10-CM | POA: Insufficient documentation

## 2011-07-01 DIAGNOSIS — R10811 Right upper quadrant abdominal tenderness: Secondary | ICD-10-CM | POA: Insufficient documentation

## 2011-07-01 DIAGNOSIS — M549 Dorsalgia, unspecified: Secondary | ICD-10-CM | POA: Insufficient documentation

## 2011-07-01 DIAGNOSIS — R112 Nausea with vomiting, unspecified: Secondary | ICD-10-CM | POA: Insufficient documentation

## 2011-07-01 DIAGNOSIS — I10 Essential (primary) hypertension: Secondary | ICD-10-CM | POA: Insufficient documentation

## 2011-07-01 DIAGNOSIS — K2990 Gastroduodenitis, unspecified, without bleeding: Secondary | ICD-10-CM | POA: Insufficient documentation

## 2011-07-01 DIAGNOSIS — J45909 Unspecified asthma, uncomplicated: Secondary | ICD-10-CM | POA: Insufficient documentation

## 2011-07-01 HISTORY — DX: Acute pancreatitis without necrosis or infection, unspecified: K85.90

## 2011-07-01 LAB — COMPREHENSIVE METABOLIC PANEL
AST: 140 U/L — ABNORMAL HIGH (ref 0–37)
CO2: 23 mEq/L (ref 19–32)
Calcium: 9.7 mg/dL (ref 8.4–10.5)
Creatinine, Ser: 0.93 mg/dL (ref 0.50–1.35)
GFR calc Af Amer: >90 mL/min (ref >90)
GFR calc non Af Amer: >90 mL/min (ref >90)
Glucose, Bld: 89 mg/dL (ref 70–99)
Sodium: 135 mEq/L (ref 135–145)
Total Protein: 9.1 g/dL — ABNORMAL HIGH (ref 6.0–8.3)

## 2011-07-01 LAB — DIFFERENTIAL
Basophils Absolute: 0 10*3/uL (ref 0.0–0.1)
Eosinophils Absolute: 0.2 10*3/uL (ref 0.0–0.7)
Eosinophils Relative: 2 % (ref 0–5)
Lymphocytes Relative: 39 % (ref 12–46)
Monocytes Absolute: 0.7 10*3/uL (ref 0.1–1.0)

## 2011-07-01 LAB — CBC
HCT: 46.7 % (ref 39.0–52.0)
MCH: 33.2 pg (ref 26.0–34.0)
MCV: 95.1 fL (ref 78.0–100.0)
Platelets: 205 10*3/uL (ref 150–400)
RDW: 12.7 % (ref 11.5–15.5)
WBC: 7.4 10*3/uL (ref 4.0–10.5)

## 2011-07-01 LAB — ETHANOL: Alcohol, Ethyl (B): 19 mg/dL — ABNORMAL HIGH (ref 0–11)

## 2011-07-01 MED ORDER — FENTANYL CITRATE 0.05 MG/ML IJ SOLN
50.0000 ug | Freq: Once | INTRAMUSCULAR | Status: AC
  Administered 2011-07-01: 50 ug via INTRAVENOUS
  Filled 2011-07-01: qty 2

## 2011-07-01 MED ORDER — SODIUM CHLORIDE 0.9 % IV BOLUS (SEPSIS)
500.0000 mL | Freq: Once | INTRAVENOUS | Status: AC
  Administered 2011-07-01: 1000 mL via INTRAVENOUS

## 2011-07-01 MED ORDER — HYDROMORPHONE HCL PF 1 MG/ML IJ SOLN
1.0000 mg | Freq: Once | INTRAMUSCULAR | Status: AC
  Administered 2011-07-01: 1 mg via INTRAVENOUS
  Filled 2011-07-01: qty 1

## 2011-07-01 MED ORDER — SODIUM CHLORIDE 0.9 % IV SOLN
INTRAVENOUS | Status: DC
  Administered 2011-07-01: 14:00:00 via INTRAVENOUS

## 2011-07-01 MED ORDER — GI COCKTAIL ~~LOC~~
30.0000 mL | Freq: Once | ORAL | Status: AC
  Administered 2011-07-01: 30 mL via ORAL
  Filled 2011-07-01: qty 30

## 2011-07-01 MED ORDER — TRAMADOL HCL 50 MG PO TABS: 50.0000 mg | ORAL_TABLET | Freq: Four times a day (QID) | ORAL | Status: AC | PRN

## 2011-07-01 MED ORDER — TRAMADOL HCL 50 MG PO TABS
50.0000 mg | ORAL_TABLET | Freq: Once | ORAL | Status: AC
  Administered 2011-07-01: 50 mg via ORAL
  Filled 2011-07-01: qty 1

## 2011-07-01 MED ORDER — FAMOTIDINE 20 MG PO TABS
20.0000 mg | ORAL_TABLET | Freq: Once | ORAL | Status: AC
  Administered 2011-07-01: 20 mg via ORAL
  Filled 2011-07-01: qty 1

## 2011-07-01 MED ORDER — FAMOTIDINE 20 MG PO TABS: 20.0000 mg | ORAL_TABLET | Freq: Two times a day (BID) | ORAL | Status: DC

## 2011-07-01 MED ORDER — ONDANSETRON HCL 4 MG/2ML IJ SOLN
4.0000 mg | Freq: Once | INTRAMUSCULAR | Status: AC
  Administered 2011-07-01: 4 mg via INTRAVENOUS
  Filled 2011-07-01: qty 2

## 2011-07-01 MED ORDER — ONDANSETRON HCL 4 MG PO TABS: 4.0000 mg | ORAL_TABLET | Freq: Four times a day (QID) | ORAL | Status: AC | PRN

## 2011-07-01 MED ORDER — ONDANSETRON HCL 8 MG PO TABS
4.0000 mg | ORAL_TABLET | Freq: Once | ORAL | Status: AC
  Administered 2011-07-01: 4 mg via ORAL
  Filled 2011-07-01: qty 1

## 2011-07-01 MED ORDER — SODIUM CHLORIDE 0.9 % IV BOLUS (SEPSIS)
500.0000 mL | Freq: Once | INTRAVENOUS | Status: AC
  Administered 2011-07-01: 500 mL via INTRAVENOUS

## 2011-07-01 NOTE — ED Provider Notes (Signed)
  Physical Exam  BP 131/104  Pulse 53  Temp(Src) 97.7 F (36.5 C) (Oral)  Resp 18  Ht 5\' 9"  (1.753 m)  Wt 148 lb (67.132 kg)  BMI 21.86 kg/m2  SpO2 99%  Physical Exam  ED Course  Procedures  MDM Upper abdominal pain nausea vomiting diarrhea since Wednesday. He has a history of pancreatitis. He did drink one beer yesterday morning. He states he also had one piece of fried chicken. He has a typical pain for his pancreatitis.  Juliet Rude. Rubin Payor, MD 07/01/11 1408

## 2011-07-01 NOTE — ED Provider Notes (Signed)
[name] has history of pancreatitis. He states on the weekend he drinks 40 ounces of beer use on Friday and Saturday. He also relates is noted when he eats certain foods he gets some abdominal pain in his left upper quadrant that radiates around to his left back. He states he started having abdominal pain 2 days ago with nausea and vomiting and diarrhea.  Patient relates he's been sober for cocaine for 3 years. He was strongly encouraged to quit drinking/abuse history of pancreatitis and crit smoking.  Patient's alert and cooperative he appears to be in no distress abdomen soft with mild tenderness. He has no tenderness in the right upper quadrant he is mainly tender in the epigastric and left upper quadrant. There is no guarding or rebound.  Medical screening examination/treatment/procedure(s) were conducted as a shared visit with non-physician practitioner(s) and myself.  I personally evaluated the patient during the encounter Devoria Albe, MD, Franz Dell, MD 07/01/11 [phone]

## 2011-07-01 NOTE — ED Provider Notes (Signed)
History     CSN: [id number]  Arrival date & time 07/01/11  1342   First MD Initiated Contact with Patient 07/01/11 1358      Chief Complaint  Patient presents with  . Abdominal Pain    (Consider location/radiation/quality/duration/timing/severity/associated sxs/prior treatment) Patient is a 41 y.o. male presenting with abdominal pain. The history is provided by the patient.  Abdominal Pain The primary symptoms of the illness include abdominal pain, nausea, vomiting and diarrhea. The primary symptoms of the illness do not include fever, shortness of breath or dysuria.  Additional symptoms associated with the illness include back pain. Symptoms associated with the illness do not include chills or hematuria.  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 Pain is in epigastric region and LUQ with radiation to the back. Described as burning, severe, constant, unchanged since its start. There has been associated "indigestion", but no associated fever, chills, CP, SOB. Nothing makes the pain better or worse. Took tylenol PTA without relief of pain. Reports last alcohol intake as yesterday. No hx abd surgeries.   Past Medical History  Diagnosis Date  . Hypertension   . Asthma   . Pancreatitis     History reviewed. No pertinent past surgical history.  No family history on file.  History  Substance Use Topics  . Smoking status: Current Everyday Smoker  . Smokeless tobacco: Not on file  . Alcohol Use: Yes     heavily 3x/week      Review of Systems  Constitutional: Negative for fever and chills.  HENT: Negative for ear pain, sore throat, neck pain, neck stiffness and tinnitus.   Eyes: Negative for pain and visual disturbance.    Respiratory: Negative for cough, chest tightness and shortness of breath.   Cardiovascular: Negative for chest pain and leg swelling.  Gastrointestinal: Positive for nausea, vomiting, abdominal pain and diarrhea.  Genitourinary: Negative for dysuria and hematuria.  Musculoskeletal: Positive for back pain. Negative for joint swelling and gait problem.  Skin: Negative for rash.  Neurological: Negative for syncope, speech difficulty and headaches.  Psychiatric/Behavioral: Negative for confusion.    Allergies  Shellfish-derived products  Home Medications   Current Outpatient Rx  Name Route Sig Dispense Refill  . ACETAMINOPHEN 500 MG PO TABS Oral Take 500 mg by mouth every 6 (six) hours as needed. For pain    . ALBUTEROL SULFATE HFA 108 (90 BASE) MCG/ACT IN AERS Inhalation Inhale 2 puffs into the lungs every 6 (six) hours as needed. Shortness of breath. Asthma     . CLONIDINE HCL 0.2 MG PO TABS Oral Take 0.2 mg by mouth 2 (two) times daily.     . ADULT MULTIVITAMIN W/MINERALS CH Oral Take 1 tablet by mouth daily.      BP 154/89  Pulse 119  Temp(Src) 98 F (36.7 C) (Oral)  Resp 16  Ht 5\' 9"  (1.753 m)  Wt 148 lb (67.132 kg)  BMI 21.86 kg/m2  SpO2 100%  Physical Exam  Nursing note reviewed. Constitutional: He is oriented to person, place, and time. He appears well-developed and well-nourished.       VS reviewed and are significant for HTN and tachycardia. Pt uncomfortable appearing.  HENT:  Head: Normocephalic and atraumatic.  Right Ear: External ear normal.  Left  Ear: External ear normal.  Mouth/Throat: Oropharynx is clear and moist.  Eyes: Pupils are equal, round, and reactive to light. No scleral icterus.  Neck: Normal range of motion. Neck supple.  Cardiovascular: Regular rhythm, normal heart sounds and intact distal pulses.  Tachycardia present.   No murmur heard. Pulmonary/Chest: Effort normal and breath sounds normal. No respiratory distress. He has no wheezes. He  exhibits no tenderness.  Abdominal: Soft. Bowel sounds are normal. He exhibits no distension. There is tenderness in the epigastric area and left upper quadrant. There is no rigidity, no rebound, no guarding, no CVA tenderness and negative Murphy's sign.  Musculoskeletal: He exhibits no edema and no tenderness.  Neurological: He is oriented to person, place, and time. Coordination normal.  Skin: Skin is warm and dry.  Psychiatric: His mood appears anxious.    ED Course  Procedures (including critical care time)  Labs Reviewed  COMPREHENSIVE METABOLIC PANEL - Abnormal; Notable for the following:    Total Protein 9.1 (*)    AST 140 (*) SLIGHT HEMOLYSIS   ALT 160 (*)    Alkaline Phosphatase 120 (*)    All other components within normal limits  ETHANOL - Abnormal; Notable for the following:    Alcohol, Ethyl (B) 19 (*)    All other components within normal limits  CBC  DIFFERENTIAL  LIPASE, BLOOD   Dg Abd Acute W/chest  07/01/2011  *RADIOLOGY REPORT*  Clinical Data: 41 year old male with mid abdominal pain.  History of pancreatitis.  ACUTE ABDOMEN SERIES (ABDOMEN 2 VIEW & CHEST 1 VIEW)  Comparison: 06/06/2010 and prior radiographs  Findings: The cardiomediastinal silhouette is unremarkable. Increased density at the right lung apex medially is identified and new since prior studies. The lungs are otherwise clear. There is no evidence of pleural effusion or pneumothorax.  Nondistended gas and fluid filled loops of bowel are identified. No dilated bowel loops or pneumoperitoneum identified. No suspicious calcifications are present. The bony structures are unremarkable.  IMPRESSION: Increased density in the medial right lung apex - airspace disease or mass is not excluded.  Chest CT with contrast is recommended for further evaluation.  Nonspecific nonobstructive bowel gas pattern - no evidence of pneumoperitoneum.  Original Report Authenticated By: Rosendo Gros, M.D.     1. Transaminitis   2.  Gastritis       MDM  3:30 PM Pt has been seen and evaluated. Symptoms c/w prior episodes of pancreatitis. Although pt denies alcohol use in last 24 hours, his EtOH level is elevated at 19 and I suspect this as a contributing factor to his symptoms which may be attributed to gastritis vs chronic pancreatitis. Transaminitis present- appears intermittent and chronic for patient based on prior records. As there is no RUQ tenderness to deep palpation, do not suspect biliary pathology as component of illness.    9:00 PM Patient with significant improvement in his symptoms. Has tolerated PO in ED. VSS with no tachcyardia or HTN on last check. He will be discharged home. Discussed alcohol cessation with patient.    Shaaron Adler, [place] 07/01/11 2132

## 2011-07-01 NOTE — Discharge Instructions (Signed)
As we discussed, your liver function tests are elevated today but your lipase was not elevated. Your vital signs improved in the several hours here in the emergency department. It has been deemed safe to be discharged home to continue your recovery. You are being given prescriptions for pain and nausea medication as well as medicine to help reduce the acid production in your stomach. This is the same as you were given in the emergency department that you reported made your symptoms feel better.     Gastritis Gastritis is an inflammation (the body's way of reacting to injury and/or infection) of the stomach. It is often caused by viral or bacterial (germ) infections. It can also be caused by chemicals (including alcohol) and medications. This illness may be associated with generalized malaise (feeling tired, not well), cramps, and fever. The illness may last 2 to 7 days. If symptoms of gastritis continue, gastroscopy (looking into the stomach with a telescope-like instrument), biopsy (taking tissue samples), and/or blood tests may be necessary to determine the cause. Antibiotics will not affect the illness unless there is a bacterial infection present. One common bacterial cause of gastritis is an organism known as H. Pylori. This can be treated with antibiotics. Other forms of gastritis are caused by too much acid in the stomach. They can be treated with medications such as H2 blockers and antacids. Home treatment is usually all that is needed. Young children will quickly become dehydrated (loss of body fluids) if vomiting and diarrhea are both present. Medications may be given to control nausea. Medications are usually not given for diarrhea unless especially bothersome. Some medications slow the removal of the virus from the gastrointestinal tract. This slows down the healing process. HOME CARE INSTRUCTIONS Home care instructions for nausea and vomiting:  For adults: drink small amounts of fluids often.  Drink at least 2 quarts a day. Take sips frequently. Do not drink large amounts of fluid at one time. This may worsen the nausea.   Only take over-the-counter or prescription medicines for pain, discomfort, or fever as directed by your caregiver.   Drink clear liquids only. Those are anything you can see through such as water, broth, or soft drinks.   Once you are keeping clear liquids down, you may start full liquids, soups, juices, and ice cream or sherbet. Slowly add bland (plain, not spicy) foods to your diet.  Home care instructions for diarrhea:  Diarrhea can be caused by bacterial infections or a virus. Your condition should improve with time, rest, fluids, and/or anti-diarrheal medication.   Until your diarrhea is under control, you should drink clear liquids often in small amounts. Clear liquids include: water, broth, jell-o water and weak tea.  Avoid:  Milk.   Fruits.   Tobacco.   Alcohol.   Extremely hot or cold fluids.   Too much intake of anything at one time.  When your diarrhea stops you may add the following foods, which help the stool to become more formed:  Rice.   Bananas.   Apples without skin.   Dry toast.  Once these foods are tolerated you may add low-fat yogurt and low-fat cottage cheese. They will help to restore the normal bacterial balance in your bowel. Wash your hands well to avoid spreading bacteria (germ) or virus. SEEK IMMEDIATE MEDICAL CARE IF:   You are unable to keep fluids down.   Vomiting or diarrhea become persistent (constant).   Abdominal pain develops, increases, or localizes. (Right sided pain can be  appendicitis. Left sided pain in adults can be diverticulitis.)   You develop a fever (an oral temperature above 102 F (38.9 C)).   Diarrhea becomes excessive or contains blood or mucus.   You have excessive weakness, dizziness, fainting or extreme thirst.   You are not improving or you are getting worse.   You have any other  questions or concerns.  Document Released: 04/19/2001 Document Revised: 01/05/2011 Document Reviewed: 04/25/2005 [facility] Patient Information 2012 [place], [place].        B.R.A.T. Diet Your doctor has recommended the B.R.A.T. diet for you or your child until the condition improves. This is often used to help control diarrhea and vomiting symptoms. If you or your child can tolerate clear liquids, you may have:  Bananas.   Rice.   Applesauce.   Toast (and other simple starches such as crackers, potatoes, noodles).  Be sure to avoid dairy products, meats, and fatty foods until symptoms are better. Fruit juices such as apple, grape, and prune juice can make diarrhea worse. Avoid these. Continue this diet for 2 days or as instructed by your caregiver. Document Released: 04/25/2005 Document Revised: 01/05/2011 Document Reviewed: 10/12/2006 [facility] Patient Information 2012 [place], [place].

## 2011-07-01 NOTE — ED Notes (Signed)
Hx . Of  Pancreatitis, pt. Began having abdominal pain n/v/d since yesterday.

## 2011-07-02 NOTE — ED Provider Notes (Signed)
See prior note   Matthieu Loftus L Tennyson Kallen, MD 07/02/11 0058 

## 2011-07-09 ENCOUNTER — Encounter (HOSPITAL_COMMUNITY): Payer: Self-pay | Admitting: Emergency Medicine

## 2011-07-09 ENCOUNTER — Emergency Department (HOSPITAL_COMMUNITY)
Admission: EM | Admit: 2011-07-09 | Discharge: 2011-07-09 | Disposition: A | Discharge status: Home Health | Payer: Self-pay | Attending: Emergency Medicine | Admitting: Emergency Medicine

## 2011-07-09 DIAGNOSIS — Z79899 Other long term (current) drug therapy: Secondary | ICD-10-CM | POA: Insufficient documentation

## 2011-07-09 DIAGNOSIS — R252 Cramp and spasm: Secondary | ICD-10-CM | POA: Insufficient documentation

## 2011-07-09 DIAGNOSIS — R109 Unspecified abdominal pain: Secondary | ICD-10-CM | POA: Insufficient documentation

## 2011-07-09 DIAGNOSIS — F1410 Cocaine abuse, uncomplicated: Secondary | ICD-10-CM | POA: Insufficient documentation

## 2011-07-09 DIAGNOSIS — R000 Tachycardia, unspecified: Secondary | ICD-10-CM | POA: Insufficient documentation

## 2011-07-09 DIAGNOSIS — J45909 Unspecified asthma, uncomplicated: Secondary | ICD-10-CM | POA: Insufficient documentation

## 2011-07-09 DIAGNOSIS — I10 Essential (primary) hypertension: Secondary | ICD-10-CM | POA: Insufficient documentation

## 2011-07-09 LAB — BASIC METABOLIC PANEL
GFR calc Af Amer: >90 mL/min (ref >90)
GFR calc non Af Amer: 90 mL/min — ABNORMAL LOW (ref >90)
Glucose, Bld: 89 mg/dL (ref 70–99)
Potassium: 4.3 mEq/L (ref 3.5–5.1)
Sodium: 136 mEq/L (ref 135–145)

## 2011-07-09 LAB — URINE MICROSCOPIC-ADD ON

## 2011-07-09 LAB — DIFFERENTIAL
Basophils Relative: 0 % (ref 0–1)
Eosinophils Absolute: 0 10*3/uL (ref 0.0–0.7)
Lymphs Abs: 2.2 10*3/uL (ref 0.7–4.0)
Neutrophils Relative %: 80 % — ABNORMAL HIGH (ref 43–77)

## 2011-07-09 LAB — URINALYSIS, ROUTINE W REFLEX MICROSCOPIC
Nitrite: NEGATIVE
Specific Gravity, Urine: 1.024 (ref 1.005–1.030)
Urobilinogen, UA: 0.2 mg/dL (ref 0.0–1.0)

## 2011-07-09 LAB — ETHANOL: Alcohol, Ethyl (B): <11 mg/dL (ref 0–11)

## 2011-07-09 LAB — RAPID URINE DRUG SCREEN, HOSP PERFORMED: Opiates: NOT DETECTED

## 2011-07-09 LAB — CBC
MCH: 32.2 pg (ref 26.0–34.0)
Platelets: 191 10*3/uL (ref 150–400)
RBC: 4.16 MIL/uL — ABNORMAL LOW (ref 4.22–5.81)

## 2011-07-09 MED ORDER — KETOROLAC TROMETHAMINE 30 MG/ML IJ SOLN
30.0000 mg | Freq: Once | INTRAMUSCULAR | Status: AC
  Administered 2011-07-09: 30 mg via INTRAVENOUS
  Filled 2011-07-09: qty 1

## 2011-07-09 MED ORDER — SODIUM CHLORIDE 0.9 % IV SOLN
INTRAVENOUS | Status: DC
  Administered 2011-07-09: 09:00:00 via INTRAVENOUS

## 2011-07-09 MED ORDER — LORAZEPAM 2 MG/ML IJ SOLN
1.0000 mg | Freq: Once | INTRAMUSCULAR | Status: AC
  Administered 2011-07-09: 1 mg via INTRAVENOUS
  Filled 2011-07-09: qty 1

## 2011-07-09 MED ORDER — SODIUM CHLORIDE 0.9 % IV BOLUS (SEPSIS)
1000.0000 mL | Freq: Once | INTRAVENOUS | Status: DC
Start: 2011-07-09 — End: 2011-07-09

## 2011-07-09 MED ORDER — ONDANSETRON HCL 4 MG/2ML IJ SOLN
INTRAMUSCULAR | Status: AC
  Administered 2011-07-09: 08:00:00
  Filled 2011-07-09: qty 2

## 2011-07-09 MED ORDER — SODIUM CHLORIDE 0.9 % IV BOLUS (SEPSIS)
1000.0000 mL | Freq: Once | INTRAVENOUS | Status: AC
  Administered 2011-07-09: 1000 mL via INTRAVENOUS

## 2011-07-09 MED ORDER — LORAZEPAM 1 MG PO TABS: 1.0000 mg | ORAL_TABLET | Freq: Three times a day (TID) | ORAL | Status: AC | PRN

## 2011-07-09 NOTE — Discharge Instructions (Signed)
Avoid using cocaine. See your doctor for problems. Use the provided information to help find followup care for the cocaine abuse.  Chemical Dependency Chemical dependency is an addiction to drugs or alcohol. It is characterized by the repeated behavior of seeking out and using drugs and alcohol despite harmful consequences to the health and safety of ones self and others.  RISK FACTORS There are certain situations or behaviors that increase a person's risk for chemical dependency. These include:  A family history of chemical dependency.   A history of mental health issues, including depression and anxiety.   A home environment where drugs and alcohol are easily available to you.   Drug or alcohol use at a young age.  SYMPTOMS  The following symptoms can indicate chemical dependency:  Inability to limit the use of drugs or alcohol.   Nausea, sweating, shakiness, and anxiety that occurs when alcohol or drugs are not being used.   An increase in amount of drugs or alcohol that is necessary to get drunk or high.  People who experience these symptoms can assess their use of drugs and alcohol by asking themselves the following questions:  Have you been told by friends or family that they are worried about your use of alcohol or drugs?   Do friends and family ever tell you about things you did while drinking alcohol or using drugs that you do not remember?   Do you lie about using alcohol or drugs or about the amounts you use?   Do you have difficulty completing daily tasks unless you use alcohol or drugs?   Is the level of your work or school performance lower because of your drug or alcohol use?   Do you get sick from using drugs or alcohol but keep using anyway?   Do you feel uncomfortable in social situations unless you use alcohol or drugs?   Do you use drugs or alcohol to help forget problems?  An answer of yes to any of these questions may indicate chemical dependency.  Professional evaluation is suggested. Document Released: 04/19/2001 Document Revised: 01/05/2011 Document Reviewed: 07/01/2010 [facility] Patient Information 2012 [place], [place].Cocaine Abuse and Chemical Dependency WHEN IS DRUG USE A PROBLEM? Anytime drug use is interfering with normal living activities it has become abuse. This includes problems with family and friends. Psychological dependence has developed when your mind tells you that the drug is needed. This is usually followed by physical dependence which has developed when continuing increases of drug are required to get the same feeling or "high". This is known as addiction or chemical dependency. A person's risk is much higher if there is a history of chemical dependency in the family. SIGNS OF CHEMICAL DEPENDENCY:  Been told by friends or family that drugs have become a problem.   Fighting when using drugs.   Having blackouts (not remembering what you do while using).   Feel sick from using drugs but continue using.   Lie about use or amounts of drugs (chemicals) used.   Need chemicals to get you going.   Suffer in work International aid/development worker or school because of drug use.   Get sick from use of drugs but continue to use anyway.   Need drugs to relate to people or feel comfortable in social situations.   Use drugs to forget problems.  Yes answered to any of the above signs of chemical dependency indicates there are problems. The longer the use of drugs continues, the greater the problems will become. If  there is a family history of drug or alcohol use it is best not to experiment with these drugs. Experimentation leads to tolerance and needing to use more of the drug to get the same feeling. This is followed by addiction where drugs become the most important part of life. It becomes more important to take drugs than participate in the other usual activities of life including relating to friends and family. Addiction is followed by  dependency where drugs are now needed not just to get high but to feel normal. Addiction cannot be cured but it can be stopped. This often requires outside help and the care of professionals. Treatment centers are listed in the yellow pages under: Cocaine, Narcotics, and Alcoholics Anonymous. Most hospitals and clinics can refer you to a specialized care center. WHAT IS COCAINE? Cocaine is a strong nervous system stimulant which speeds up the body and gives the user the feeling that they have increased energy, loss of appetite and feelings of great pleasure. This "high" which begins within several minutes and lasts for less than an hour is followed by a "crash". The crash and depressed feelings that come with it cause a craving for the drug to regain the high. HOW IS COCANINE USED? Cocaine is snorted, injected, and smoked as free- base or crack. Because smoking the drug produces a greater high it is also associated with a greater low. It is therefore more rapidly addicting. WHAT ARE THE EFFECTS OF COCAINE? It is an anesthetic (pain killer) and a stimulant (it causes a high which gives a false feeling of well being). It increases heart and breathing rates with increases in body temperature and blood pressure. It removes appetite. It causes seizures (convulsions) along with nausea (feeling sick to your stomach), vomiting and stomach pain. This dangerous combination can lead to death. Trying to keep the high feeling leads to greater and greater drug use and this leads to addiction. Addiction can only be helped by stopping use of all chemicals. This is hard but may save your life. If the addiction is continued, the only possible outcome is loss of self respect and self esteem, violence, death, and eventually prison if the addict is fortunate enough to be caught and able to receive help prior to this last life ending event. OTHER HEALTH RISKS OF COCAINE AND ALL DRUG USE ARE:  The increased possibility of  getting AIDS or hepatitis (liver inflammation).   Having a baby born which is addicted to cocaine and must go through painful withdrawal including shaking, jerking, and crying in pain. Many of the babies die. Other babies go through life with lifelong disabilities and learning problems.  HOW TO STAY DRUG FREE ONCE YOU HAVE QUIT USING:  Develop healthy activities and form friends who do not use drugs.   Stay away from the drug scene.   Tell the pusher or former friend you have other better things to do.   Have ready excuses available about why you cannot use.  For more help or information contact your local physician, clinic, hospital or dial 1-800-cocaine 617-183-8754). Document Released: 04/22/2000 Document Revised: 01/05/2011 Document Reviewed: 12/12/2007 [facility] Patient Information 2012 [place], [place].Leg Cramps Leg cramps that occur during exercise can be caused by poor circulation or dehydration. However, muscle cramps that occur at rest or during the night are usually not due to any serious medical problem. Heat cramps may cause muscle spasms during hot weather.  CAUSES There is no clear cause for muscle cramps. However, dehydration may  be a factor for those who do not drink enough fluids and those who exercise in the heat. Imbalances in the level of sodium, potassium, calcium or magnesium in the muscle tissue may also be a factor. Some medications, such as water pills (diuretics), may cause loss of chemicals that the body needs (like sodium and potassium) and cause muscle cramps. TREATMENT   Make sure your diet has enough fluids and essential minerals for the muscle to work normally.   Avoid strenuous exercise for several days if you have been having frequent leg cramps.   Stretch and massage the cramped muscle for several minutes.   Some medicines may be helpful in some patients with night cramps. Only take over-the-counter or prescription medicines as directed by your  caregiver.  SEEK IMMEDIATE MEDICAL CARE IF:   Your leg cramps become worse.   Your foot becomes cold, numb, or blue.  Document Released: 06/02/2004 Document Revised: 01/05/2011 Document Reviewed: 05/20/2008 [facility] Patient Information 2012 [place], [place].

## 2011-07-09 NOTE — ED Provider Notes (Signed)
History     CSN: [id number]  Arrival date & time 07/09/11  1027   First MD Initiated Contact with Patient 07/09/11 0703      Chief Complaint  Patient presents with  . Tachycardia    (Consider location/radiation/quality/duration/timing/severity/associated sxs/prior treatment) HPI Comments: Mathayus Stanbery Mclean is a 41 y.o. male who presents after smoking crack all night complaining of cramps and being remorseful about relapsing into his use. He has no headache, fever, chills, chest pain, nausea, vomiting, or back pain. He has had some mild, abdominal pain that is cramping in nature. Was recently in the ED, and treated for pancreatitis. He states he wants to get into rehabilitation again.  The history is provided by the patient.    Past Medical History  Diagnosis Date  . Hypertension   . Asthma   . Pancreatitis     No past surgical history on file.  No family history on file.  History  Substance Use Topics  . Smoking status: Current Everyday Smoker  . Smokeless tobacco: Not on file  . Alcohol Use: Yes     heavily 3x/week      Review of Systems  All other systems reviewed and are negative.    Allergies  Shellfish-derived products  Home Medications   Current Outpatient Rx  Name Route Sig Dispense Refill  . ACETAMINOPHEN 500 MG PO TABS Oral Take 500 mg by mouth every 6 (six) hours as needed. For pain    . ALBUTEROL SULFATE HFA 108 (90 BASE) MCG/ACT IN AERS Inhalation Inhale 2 puffs into the lungs every 6 (six) hours as needed. Shortness of breath. Asthma     . CLONIDINE HCL 0.2 MG PO TABS Oral Take 0.2 mg by mouth 2 (two) times daily.     [name] FAMOTIDINE 20 MG PO TABS Oral Take 1 tablet (20 mg total) by mouth 2 (two) times daily. 30 tablet 0  . ADULT MULTIVITAMIN W/MINERALS CH Oral Take 1 tablet by mouth daily.    . TRAMADOL HCL 50 MG PO TABS Oral Take 1 tablet (50 mg total) by mouth every 6 (six) hours as needed for pain. 15 tablet 0  . LORAZEPAM 1 MG PO TABS Oral Take  1 tablet (1 mg total) by mouth 3 (three) times daily as needed for anxiety. 15 tablet 0  . ONDANSETRON HCL 4 MG PO TABS Oral Take 1 tablet (4 mg total) by mouth every 6 (six) hours as needed for nausea. 12 tablet 0    BP 128/77  Pulse 96  Temp(Src) 98.7 F (37.1 C) (Oral)  Resp 16  SpO2 100%  Physical Exam  Nursing note and vitals reviewed. Constitutional: He is oriented to person, place, and time. He appears well-developed and well-nourished.  HENT:  Head: Normocephalic and atraumatic.  Right Ear: External ear normal.  Left Ear: External ear normal.  Eyes: Conjunctivae and EOM are normal. Pupils are equal, round, and reactive to light.  Neck: Normal range of motion and phonation normal. Neck supple.  Cardiovascular: Normal rate, regular rhythm, normal heart sounds and intact distal pulses.   Pulmonary/Chest: Effort normal and breath sounds normal. He exhibits no bony tenderness.  Abdominal: Soft. Normal appearance. There is no tenderness.  Musculoskeletal: Normal range of motion.  Neurological: He is alert and oriented to person, place, and time. He has normal strength. No cranial nerve deficit or sensory deficit. He exhibits normal muscle tone. Coordination normal.  Skin: Skin is warm, dry and intact.  Psychiatric: He has a  normal mood and affect. His behavior is normal. Judgment and thought content normal.    ED Course  Procedures (including critical care time)  Labs Reviewed  CBC - Abnormal; Notable for the following:    WBC 18.0 (*)    RBC 4.16 (*)    All other components within normal limits  DIFFERENTIAL - Abnormal; Notable for the following:    Neutrophils Relative 80 (*)    Neutro Abs 14.4 (*)    Monocytes Absolute 1.4 (*)    All other components within normal limits  BASIC METABOLIC PANEL - Abnormal; Notable for the following:    GFR calc non Af Amer 90 (*)    All other components within normal limits  URINALYSIS, ROUTINE W REFLEX MICROSCOPIC - Abnormal; Notable  for the following:    Hgb urine dipstick TRACE (*)    Ketones, ur 15 (*)    All other components within normal limits  URINE RAPID DRUG SCREEN (HOSP PERFORMED) - Abnormal; Notable for the following:    Cocaine POSITIVE (*)    Tetrahydrocannabinol POSITIVE (*)    Barbiturates POSITIVE (*)    All other components within normal limits  URINE MICROSCOPIC-ADD ON - Abnormal; Notable for the following:    Casts HYALINE CASTS (*)    Crystals URIC ACID CRYSTALS (*)    All other components within normal limits  ETHANOL   10:02 AM Reevaluation with update and discussion. After initial assessment and treatment, an updated evaluation reveals Repeat vitals are improved. He has received IVF. He feels better now.  . Marca Gadsby L   1. Cocaine abuse   2. Muscle cramp       MDM  Cramping after heavy cocaine use. No evidence for acute toxicity, ACS, metabolic instability or suspected occult infection.   Plan: Home Medications- Ativan; Home Treatments- Stop Cocaine; Recommended follow up- List for treatment resources given.        Flint Melter, MD 07/09/11 1003

## 2011-07-09 NOTE — ED Notes (Signed)
Last Cocaine at 0400 this AM.

## 2011-07-09 NOTE — ED Notes (Addendum)
Patient called out for "abdominal and leg cramping and all that shit, states did 10 grams of coke tonight"  Has been sober for 3 years and relapsed tonight. 4mg  Zofran Given per EMS

## 2011-07-10 ENCOUNTER — Encounter (HOSPITAL_BASED_OUTPATIENT_CLINIC_OR_DEPARTMENT_OTHER): Payer: Self-pay | Admitting: *Deleted

## 2011-07-10 ENCOUNTER — Other Ambulatory Visit: Payer: Self-pay

## 2011-07-10 ENCOUNTER — Emergency Department (HOSPITAL_BASED_OUTPATIENT_CLINIC_OR_DEPARTMENT_OTHER)
Admission: EM | Admit: 2011-07-10 | Discharge: 2011-07-11 | Disposition: A | Discharge status: Home / Self Care | Payer: Self-pay | Attending: Emergency Medicine | Admitting: Emergency Medicine

## 2011-07-10 DIAGNOSIS — Z79899 Other long term (current) drug therapy: Secondary | ICD-10-CM | POA: Insufficient documentation

## 2011-07-10 DIAGNOSIS — F17200 Nicotine dependence, unspecified, uncomplicated: Secondary | ICD-10-CM | POA: Insufficient documentation

## 2011-07-10 DIAGNOSIS — F1420 Cocaine dependence, uncomplicated: Secondary | ICD-10-CM | POA: Insufficient documentation

## 2011-07-10 DIAGNOSIS — I10 Essential (primary) hypertension: Secondary | ICD-10-CM | POA: Insufficient documentation

## 2011-07-10 DIAGNOSIS — J45909 Unspecified asthma, uncomplicated: Secondary | ICD-10-CM | POA: Insufficient documentation

## 2011-07-10 HISTORY — DX: Cocaine abuse, uncomplicated: F14.10

## 2011-07-10 LAB — BASIC METABOLIC PANEL
CO2: 25 mEq/L (ref 19–32)
Chloride: 101 mEq/L (ref 96–112)
Creatinine, Ser: 0.8 mg/dL (ref 0.50–1.35)
Potassium: 3.8 mEq/L (ref 3.5–5.1)

## 2011-07-10 LAB — CBC
HCT: 36.4 % — ABNORMAL LOW (ref 39.0–52.0)
Hemoglobin: 13 g/dL (ref 13.0–17.0)
MCV: 90.3 fL (ref 78.0–100.0)
RDW: 12.5 % (ref 11.5–15.5)
WBC: 8 10*3/uL (ref 4.0–10.5)

## 2011-07-10 LAB — URINALYSIS, ROUTINE W REFLEX MICROSCOPIC
Bilirubin Urine: NEGATIVE
Glucose, UA: NEGATIVE mg/dL
Ketones, ur: NEGATIVE mg/dL
Leukocytes, UA: NEGATIVE
Specific Gravity, Urine: 1.01 (ref 1.005–1.030)
pH: 5.5 (ref 5.0–8.0)

## 2011-07-10 LAB — RAPID URINE DRUG SCREEN, HOSP PERFORMED
Benzodiazepines: NOT DETECTED
Cocaine: POSITIVE — AB

## 2011-07-10 MED ORDER — FAMOTIDINE IN NACL 20-0.9 MG/50ML-% IV SOLN
20.0000 mg | Freq: Once | INTRAVENOUS | Status: AC
  Administered 2011-07-10: 20 mg via INTRAVENOUS
  Filled 2011-07-10: qty 50

## 2011-07-10 MED ORDER — KETOROLAC TROMETHAMINE 30 MG/ML IJ SOLN
30.0000 mg | Freq: Once | INTRAMUSCULAR | Status: AC
  Administered 2011-07-10: 30 mg via INTRAVENOUS
  Filled 2011-07-10: qty 1

## 2011-07-10 MED ORDER — SODIUM CHLORIDE 0.9 % IV BOLUS (SEPSIS)
1000.0000 mL | Freq: Once | INTRAVENOUS | Status: AC
  Administered 2011-07-10: 1000 mL via INTRAVENOUS

## 2011-07-10 NOTE — ED Notes (Signed)
Pt states that he was 3 years clean from cocaine Sat he was seen at [facility] for Cocaine abuse states that he "almost blew his heart out" and was [facility] with referrals for detox and [facility] was on the list. States that he was told he needed to get his BP under control before he could go to detox. States that he is having muscle spasms and abd cramping. States that he spent $1200 on cocaine and was not able to fill his ativan

## 2011-07-10 NOTE — ED Provider Notes (Addendum)
History   This chart was scribed for Felisa Bonier, MD by Melba Coon. The patient was seen in room MH05/MH05 and the patient's care was started at 10:35PM.    CSN: [id number]  Arrival date & time 07/10/11  2107   First MD Initiated Contact with Patient 07/10/11 2156      Chief Complaint  Patient presents with  . Medical Clearance    (Consider location/radiation/quality/duration/timing/severity/associated sxs/prior treatment) HPI [name] is a 41 y.o. male who presents to the Emergency Department for a medical clearance to be admitted into [facility], a drug abuse treatment facility. Pt states that he was 3 years clean from cocaine, but yesterday morning around 4:00AM, he used cocaine that he spent $1200 on and was seen at [facility] for cocaine abuse. Pt wants to go to detox at [facility], who required him to get medical clearance, specifically his BP check ed out, and [facility] was on the list of viable places to get checked out. Pt also states that he is having HAs and muscle spasms. No neck pain, back pain, n/v/d, or extremity pain or edema. No chest pain or abdominal pain.  Hx of asthma and pancreatitis. No known allergies. No other pertinent medical symptoms.  Past Medical History  Diagnosis Date  . Hypertension   . Asthma   . Pancreatitis   . Cocaine abuse     History reviewed. No pertinent past surgical history.  History reviewed. No pertinent family history.  History  Substance Use Topics  . Smoking status: Current Everyday Smoker  . Smokeless tobacco: Not on file  . Alcohol Use: Yes     heavily 3x/week      Review of Systems 10 Systems reviewed and are negative for acute change except as noted in the HPI.  Allergies  Shellfish-derived products  Home Medications   Current Outpatient Rx  Name Route Sig Dispense Refill  . ACETAMINOPHEN 500 MG PO TABS Oral Take 500 mg by mouth every 6 (six) hours as needed. For pain    . ALBUTEROL SULFATE HFA 108 (90 BASE)  MCG/ACT IN AERS Inhalation Inhale 2 puffs into the lungs every 6 (six) hours as needed. Shortness of breath. Asthma     . CLONIDINE HCL 0.2 MG PO TABS Oral Take 0.2 mg by mouth 2 (two) times daily.     [name] FAMOTIDINE 20 MG PO TABS Oral Take 1 tablet (20 mg total) by mouth 2 (two) times daily. 30 tablet 0  . LORAZEPAM 1 MG PO TABS Oral Take 1 tablet (1 mg total) by mouth 3 (three) times daily as needed for anxiety. 15 tablet 0  . ADULT MULTIVITAMIN W/MINERALS CH Oral Take 1 tablet by mouth daily.    . TRAMADOL HCL 50 MG PO TABS Oral Take 1 tablet (50 mg total) by mouth every 6 (six) hours as needed for pain. 15 tablet 0    BP 125/89  Pulse 104  Temp 97.7 F (36.5 C)  Resp 20  SpO2 100%  Physical Exam  Nursing note and vitals reviewed. Constitutional: He is oriented to person, place, and time. He appears well-developed and well-nourished. No distress.       Awake, alert, nontoxic appearance.  HENT:  Head: Normocephalic and atraumatic.  Right Ear: External ear normal.  Left Ear: External ear normal.  Mouth/Throat: Oropharynx is clear and moist.  Eyes: Conjunctivae and EOM are normal. Pupils are equal, round, and reactive to light. Right eye exhibits no discharge. Left eye exhibits no  discharge.       Pupils 3mm.  Neck: Normal range of motion. Neck supple.  Cardiovascular: Normal rate, regular rhythm, normal heart sounds and intact distal pulses.  Exam reveals no gallop and no friction rub.   No murmur heard.      Nml BP.  Pulmonary/Chest: Effort normal and breath sounds normal. No respiratory distress. He has no wheezes. He has no rales. He exhibits no tenderness.  Abdominal: Soft. Bowel sounds are normal. He exhibits no distension. There is no tenderness. There is no rebound and no guarding.  Musculoskeletal: Normal range of motion. He exhibits no edema and no tenderness.       Baseline ROM, no obvious new focal weakness.  Neurological: He is alert and oriented to person, place, and  time. He has normal reflexes. No cranial nerve deficit. He exhibits normal muscle tone. Coordination normal.       Mental status and motor strength appears baseline for patient and situation. Patellar DTRs 1+ bilaterally.  Skin: Skin is warm and dry. No rash noted. He is not diaphoretic. No erythema. No pallor.  Psychiatric: He has a normal mood and affect. His behavior is normal.    ED Course  Procedures (including critical care time)   Date: 07/10/2011  Rate: 106  Rhythm: sinus tachycardia  QRS Axis: normal  Intervals: normal  ST/T Wave abnormalities: nonspecific T wave changes and T wave inversions in leads V1, V2, V3, V4, nonspecific  Conduction Disutrbances:none  Narrative Interpretation: Sinus tachycardia, otherwise non-provocative EKG  Old EKG Reviewed: none available    DIAGNOSTIC STUDIES: Oxygen Saturation is 100% on room air, normal by my interpretation.    COORDINATION OF CARE:  Results for orders placed during the hospital encounter of 07/10/11  URINE RAPID DRUG SCREEN (HOSP PERFORMED)      Component Value Range   Opiates NONE DETECTED  NONE DETECTED    Cocaine POSITIVE (*) NONE DETECTED    Benzodiazepines NONE DETECTED  NONE DETECTED    Amphetamines NONE DETECTED  NONE DETECTED    Tetrahydrocannabinol POSITIVE (*) NONE DETECTED    Barbiturates NONE DETECTED  NONE DETECTED   URINALYSIS, ROUTINE W REFLEX MICROSCOPIC      Component Value Range   Color, Urine YELLOW  YELLOW    APPearance CLEAR  CLEAR    Specific Gravity, Urine 1.010  1.005 - 1.030    pH 5.5  5.0 - 8.0    Glucose, UA NEGATIVE  NEGATIVE (mg/dL)   Hgb urine dipstick NEGATIVE  NEGATIVE    Bilirubin Urine NEGATIVE  NEGATIVE    Ketones, ur NEGATIVE  NEGATIVE (mg/dL)   Protein, ur NEGATIVE  NEGATIVE (mg/dL)   Urobilinogen, UA 1.0  0.0 - 1.0 (mg/dL)   Nitrite NEGATIVE  NEGATIVE    Leukocytes, UA NEGATIVE  NEGATIVE   BASIC METABOLIC PANEL      Component Value Range   Sodium 138  135 - 145 (mEq/L)    Potassium 3.8  3.5 - 5.1 (mEq/L)   Chloride 101  96 - 112 (mEq/L)   CO2 25  19 - 32 (mEq/L)   Glucose, Bld 74  70 - 99 (mg/dL)   BUN 10  6 - 23 (mg/dL)   Creatinine, Ser 7.82  0.50 - 1.35 (mg/dL)   Calcium 9.3  8.4 - 95.6 (mg/dL)   GFR calc non Af Amer >90  >90 (mL/min)   GFR calc Af Amer >90  >90 (mL/min)  CBC      Component Value Range  WBC 8.0  4.0 - 10.5 (K/uL)   RBC 4.03 (*) 4.22 - 5.81 (MIL/uL)   Hemoglobin 13.0  13.0 - 17.0 (g/dL)   HCT 16.1 (*) 09.6 - 52.0 (%)   MCV 90.3  78.0 - 100.0 (fL)   MCH 32.3  26.0 - 34.0 (pg)   MCHC 35.7  30.0 - 36.0 (g/dL)   RDW 04.5  40.9 - 81.1 (%)   Platelets 186  150 - 400 (K/uL)  ETHANOL      Component Value Range   Alcohol, Ethyl (B) 189 (*) 0 - 11 (mg/dL)     No results found.   No diagnosis found.    MDM  At this time based on my evaluation, the patient is medically cleared for drug treatment.  I personally performed the services described in this documentation, which was scribed in my presence. The recorded information has been reviewed and considered.        Felisa Bonier, MD 07/10/11 9147  Felisa Bonier, MD 07/10/11 [id number]

## 2011-07-11 ENCOUNTER — Emergency Department (HOSPITAL_BASED_OUTPATIENT_CLINIC_OR_DEPARTMENT_OTHER)
Admission: EM | Admit: 2011-07-11 | Discharge: 2011-07-11 | Disposition: A | Discharge status: Home / Self Care | Payer: Self-pay | Attending: Emergency Medicine | Admitting: Emergency Medicine

## 2011-07-11 ENCOUNTER — Encounter (HOSPITAL_BASED_OUTPATIENT_CLINIC_OR_DEPARTMENT_OTHER): Payer: Self-pay | Admitting: *Deleted

## 2011-07-11 DIAGNOSIS — I10 Essential (primary) hypertension: Secondary | ICD-10-CM | POA: Insufficient documentation

## 2011-07-11 DIAGNOSIS — F1910 Other psychoactive substance abuse, uncomplicated: Secondary | ICD-10-CM | POA: Insufficient documentation

## 2011-07-11 MED ORDER — LORAZEPAM 1 MG PO TABS: 1.0000 mg | ORAL_TABLET | Freq: Three times a day (TID) | ORAL | Status: DC | PRN

## 2011-07-11 MED ORDER — IBUPROFEN 400 MG PO TABS: 600.0000 mg | ORAL_TABLET | Freq: Three times a day (TID) | ORAL | Status: DC | PRN

## 2011-07-11 MED ORDER — CLONIDINE HCL 0.1 MG PO TABS
0.1000 mg | ORAL_TABLET | Freq: Once | ORAL | Status: AC
  Administered 2011-07-11: 0.1 mg via ORAL
  Filled 2011-07-11: qty 1

## 2011-07-11 MED ORDER — ACETAMINOPHEN 325 MG PO TABS: 650.0000 mg | ORAL_TABLET | ORAL | Status: DC | PRN

## 2011-07-11 MED ORDER — ACETAMINOPHEN 325 MG PO TABS
ORAL_TABLET | ORAL | Status: AC
  Administered 2011-07-11: 650 mg via ORAL
  Filled 2011-07-11: qty 2

## 2011-07-11 MED ORDER — NICOTINE 21 MG/24HR TD PT24
21.0000 mg | MEDICATED_PATCH | Freq: Once | TRANSDERMAL | Status: DC
  Administered 2011-07-11: 21 mg via TRANSDERMAL
  Filled 2011-07-11: qty 1

## 2011-07-11 MED ORDER — ONDANSETRON 8 MG PO TBDP
8.0000 mg | ORAL_TABLET | Freq: Once | ORAL | Status: AC
  Administered 2011-07-11: 8 mg via ORAL
  Filled 2011-07-11: qty 1

## 2011-07-11 MED ORDER — ALBUTEROL SULFATE HFA 108 (90 BASE) MCG/ACT IN AERS: 2.0000 | INHALATION_SPRAY | RESPIRATORY_TRACT | Status: DC | PRN

## 2011-07-11 MED ORDER — ACETAMINOPHEN 325 MG PO TABS
650.0000 mg | ORAL_TABLET | Freq: Once | ORAL | Status: AC
  Administered 2011-07-11: 650 mg via ORAL

## 2011-07-11 MED ORDER — CLONIDINE HCL 0.2 MG PO TABS: 0.2000 mg | ORAL_TABLET | Freq: Two times a day (BID) | ORAL | Status: DC

## 2011-07-11 MED ORDER — ONDANSETRON HCL 8 MG PO TABS: 4.0000 mg | ORAL_TABLET | Freq: Three times a day (TID) | ORAL | Status: DC | PRN

## 2011-07-11 NOTE — ED Notes (Signed)
Pt accepted at [facility]- d/c with [facility] for [facility]- rx x 2 given for clonidine and albuterol

## 2011-07-11 NOTE — Discharge Instructions (Signed)
Finding Treatment for Alcohol and Drug Addiction It can be hard to find the right place to get professional treatment. Here are some important things to consider:  There are different types of treatment to choose from.   Some programs are live-in (residential) while others are not (outpatient). Sometimes a combination is offered.   No single type of program is right for everyone.   Most treatment programs involve a combination of education, counseling, and a 12-step, spiritually-based approach.   There are non-spiritually based programs (not 12-step).   Some treatment programs are government sponsored. They are geared for patients without private insurance.   Treatment programs can vary in many respects such as:   Cost and types of insurance accepted.   Types of on-site medical services offered.   Length of stay, setting, and size.   Overall philosophy of treatment.  A person may need specialized treatment or have needs not addressed by all programs. For example, adolescents need treatment appropriate for their age. Other people have secondary disorders that must be managed as well. Secondary conditions can include mental illness, such as depression or diabetes. Often, a period of detoxification from alcohol or drugs is needed. This requires medical supervision and not all programs offer this. THINGS TO CONSIDER WHEN SELECTING A TREATMENT PROGRAM   Is the program certified by the appropriate government agency? Even private programs must be certified and employ certified professionals.   Does the program accept your insurance? If not, can a payment plan be set up?   Is the facility clean, organized, and well run? Do they allow you to speak with graduates who can share their treatment experience with you? Can you tour the facility? Can you meet with staff?   Does the program meet the full range of individual needs?   Does the treatment program address sexual orientation and physical  disabilities? Do they provide age, gender, and culturally appropriate treatment services?   Is treatment available in languages other than English?   Is long-term aftercare support or guidance encouraged and provided?   Is assessment of an individual's treatment plan ongoing to ensure it meets changing needs?   Does the program use strategies to encourage reluctant patients to remain in treatment long enough to increase the likelihood of success?   Does the program offer counseling (individual or group) and other behavioral therapies?   Does the program offer medicine as part of the treatment regimen, if needed?   Is there ongoing monitoring of possible relapse? Is there a defined relapse prevention program? Are services or referrals offered to family members to ensure they understand addiction and the recovery process? This would help them support the recovering individual.   Are 12-step meetings held at the center or is transport available for patients to attend outside meetings?  In countries outside of the [place]. and [place], Magazine features editor for contact information for services in your area. Document Released: 03/24/2005 Document Revised: 04/14/2011 Document Reviewed: 10/04/2007 [facility] Patient Information 2012 [place], [place].Cocaine Abuse and Chemical Dependency WHEN IS DRUG USE A PROBLEM? Anytime drug use is interfering with normal living activities it has become abuse. This includes problems with family and friends. Psychological dependence has developed when your mind tells you that the drug is needed. This is usually followed by physical dependence which has developed when continuing increases of drug are required to get the same feeling or "high". This is known as addiction or chemical dependency. A person's risk is much higher if there is  a history of chemical dependency in the family. SIGNS OF CHEMICAL DEPENDENCY:  Been told by friends or family that drugs have become a  problem.   Fighting when using drugs.   Having blackouts (not remembering what you do while using).   Feel sick from using drugs but continue using.   Lie about use or amounts of drugs (chemicals) used.   Need chemicals to get you going.   Suffer in work International aid/development worker or school because of drug use.   Get sick from use of drugs but continue to use anyway.   Need drugs to relate to people or feel comfortable in social situations.   Use drugs to forget problems.  Yes answered to any of the above signs of chemical dependency indicates there are problems. The longer the use of drugs continues, the greater the problems will become. If there is a family history of drug or alcohol use it is best not to experiment with these drugs. Experimentation leads to tolerance and needing to use more of the drug to get the same feeling. This is followed by addiction where drugs become the most important part of life. It becomes more important to take drugs than participate in the other usual activities of life including relating to friends and family. Addiction is followed by dependency where drugs are now needed not just to get high but to feel normal. Addiction cannot be cured but it can be stopped. This often requires outside help and the care of professionals. Treatment centers are listed in the yellow pages under: Cocaine, Narcotics, and Alcoholics Anonymous. Most hospitals and clinics can refer you to a specialized care center. WHAT IS COCAINE? Cocaine is a strong nervous system stimulant which speeds up the body and gives the user the feeling that they have increased energy, loss of appetite and feelings of great pleasure. This "high" which begins within several minutes and lasts for less than an hour is followed by a "crash". The crash and depressed feelings that come with it cause a craving for the drug to regain the high. HOW IS COCANINE USED? Cocaine is snorted, injected, and smoked as free- base or  crack. Because smoking the drug produces a greater high it is also associated with a greater low. It is therefore more rapidly addicting. WHAT ARE THE EFFECTS OF COCAINE? It is an anesthetic (pain killer) and a stimulant (it causes a high which gives a false feeling of well being). It increases heart and breathing rates with increases in body temperature and blood pressure. It removes appetite. It causes seizures (convulsions) along with nausea (feeling sick to your stomach), vomiting and stomach pain. This dangerous combination can lead to death. Trying to keep the high feeling leads to greater and greater drug use and this leads to addiction. Addiction can only be helped by stopping use of all chemicals. This is hard but may save your life. If the addiction is continued, the only possible outcome is loss of self respect and self esteem, violence, death, and eventually prison if the addict is fortunate enough to be caught and able to receive help prior to this last life ending event. OTHER HEALTH RISKS OF COCAINE AND ALL DRUG USE ARE:  The increased possibility of getting AIDS or hepatitis (liver inflammation).   Having a baby born which is addicted to cocaine and must go through painful withdrawal including shaking, jerking, and crying in pain. Many of the babies die. Other babies go through life with lifelong disabilities and  learning problems.  HOW TO STAY DRUG FREE ONCE YOU HAVE QUIT USING:  Develop healthy activities and form friends who do not use drugs.   Stay away from the drug scene.   Tell the pusher or former friend you have other better things to do.   Have ready excuses available about why you cannot use.  For more help or information contact your local physician, clinic, hospital or dial 1-800-cocaine 931 799 4769). Document Released: 04/22/2000 Document Revised: 04/14/2011 Document Reviewed: 12/12/2007 [facility] Patient Information 2012 [place], [place].Drug Abuse, Frequently  Asked Questions Drug addiction is a complex brain disease. It is characterized by compulsive, at times uncontrollable, drug craving, seeking, and use that persists even in the face of extremely negative results. Drug seeking becomes compulsive, in large part as a result of the effects of prolonged drug use on brain functioning and, thus, on behavior. For many people, drug addiction becomes chronic, with relapses possible even after long periods of being off the drug. HOW QUICKLY CAN I BECOME ADDICTED TO A DRUG? There is no easy answer to this. If and how quickly you might become addicted to a drug depends on many factors including the biology of your body. All drugs are potentially harmful and may have life-threatening consequences associated with their use. There are also vast differences among individuals in sensitivity to various drugs. While one person may use a drug many times and suffer no ill effects, another person may be particularly vulnerable and overdose or developing a craving with the first use. There is no way of knowing in advance how someone may react. HOW DO I KNOW IF SOMEONE IS ADDICTED TO DRUGS? If a person is compulsively seeking and using a drug despite negative consequences (such as loss of job, debt, physical problems brought on by drug abuse, or family problems) then he or she is probably addicted. Those who screen for drug problems, such as physicians, have developed the CAGE questionnaire. These four simple questions can help detect substance abuse problems:  Have you ever felt you ought to Cut down on your drinking/drug use?   Have people ever Annoyed you by criticizing your drinking/drug use?   Have you ever felt bad or Guilty about your drinking/drug use?   Have you ever had a drink or taken a drug first thing in the morning to steady your nerves or get rid of a hangover (Eye-opener)?  WHAT ARE THE PHYSICAL SIGNS OF ABUSE OR ADDICTION? The physical signs of abuse or  addiction can vary depending on the person and the drug being abused. For example, someone who abuses marijuana may have a chronic cough or worsening of asthmatic conditions. THC, the chemical in marijuana responsible for producing its effects, is associated with weakening the immune system which makes the user more vulnerable to infections, such as pneumonia. Each drug has short-term and long-term physical effects. Stimulants like cocaine increase heart rate and blood pressure, whereas opioids like heroin may slow the heart rate and reduce breathing (respiration).  ARE THERE EFFECTIVE TREATMENTS FOR DRUG ADDICTION? Drug addiction can be effectively treated with behavioral-based therapies and, for addiction to some drugs such as heroin or nicotine, medications may be used. Treatment may vary for each person depending on the type of drug(s) being used and multiple courses of treatment may be needed to achieve success. Research has revealed 13 basic principles that underlie effective drug addiction treatment. These are discussed in NIDA's Principles of Drug Addiction Treatment: A Research-Based Guide. WHERE CAN I FIND INFORMATION  ABOUT DRUG TREATMENT PROGRAMS?  For referrals to treatment programs, visit the Substance Abuse and Mental Health Services Administration online at http://findtreatment.http://gonzalez-rivas.net/.   NIDA publishes an expanding series of treatment manuals, the "clinical toolbox," that gives drug treatment providers research-based information for creating effective treatment programs.  WHAT IS DETOXIFICATION, OR "DETOX"? Detoxification is the process of allowing the body to rid itself of a drug while managing the symptoms of withdrawal. It is often the first step in a drug treatment program and should be followed by treatment with a behavioral-based therapy and/or a medication, if available. Detox alone with no follow-up is not treatment.  WHAT IS WITHDRAWAL? HOW LONG DOES IT LAST? Withdrawal is the  variety of symptoms that occur after use of some addictive drugs is reduced or stopped. Length of withdrawal and symptoms vary with the type of drug. For example, physical symptoms of heroin withdrawal may include restlessness, muscle and bone pain, insomnia, diarrhea, vomiting, and cold flashes. These physical symptoms may last for several days, but the general depression, or dysphoria (opposite of euphoria), that often accompanies heroin withdrawal, may last for weeks. In many cases withdrawal can be easily treated with medications to ease the symptoms. But treating withdrawal is not the same as treating addiction.  WHAT ARE THE COSTS OF DRUG ABUSE TO SOCIETY? Beyond the raw numbers are other costs to society:  Spread of infectious diseases such as HIV/AIDS and hepatitis C either through sharing of drug paraphernalia or unprotected sex.   Deaths due to overdose or other complications from drug use.   Effects on unborn children of pregnant drug users.   Other effects such as crime and homelessness.  IF A PREGNANT WOMAN ABUSES DRUGS, DOES IT AFFECT THE FETUS?  Many substances including alcohol, nicotine, and drugs of abuse can have negative effects on the developing fetus because they are transferred to the fetus across the placenta. For example, nicotine has been connected with premature birth and low birth weight, as has the use of cocaine. Scientific studies have shown that babies born to marijuana users were shorter, weighed less, and had smaller head sizes than those born to mothers who did not use the drug. Smaller babies are more likely to develop health problems.   Whether a baby's health problems, if caused by a drug, will continue as the child grows, is not always known. Research does show that children born to mothers who used marijuana regularly during pregnancy may have trouble concentrating, when older. Our research continues to produce insights on the negative effects of drug use on the  fetus.  Document Released: 04/28/2003 Document Revised: 04/14/2011 Document Reviewed: 07/25/2008 [facility] Patient Information 2012 [place], [place].

## 2011-07-11 NOTE — ED Notes (Signed)
Patient was here earlier for medical clearance and taken to drug treatment facility and brought back because of high blood pressure

## 2011-07-11 NOTE — ED Notes (Signed)
Bruce from mobile crisis working on placement with [facility]

## 2011-07-11 NOTE — ED Provider Notes (Signed)
History     CSN: [id number]  Arrival date & time 07/11/11  0145   First MD Initiated Contact with Patient 07/11/11 0157      No chief complaint on file.   (Consider location/radiation/quality/duration/timing/severity/associated sxs/prior treatment) HPI Pt presents from [facility] with mobile crisis due to concern about hypertension.  Pt was discharged approx 1-2 hours prior to his return.  Pt states he used cocaine several days ago after not using for several years, he would like detox.  Pt was medically cleared and discharged to [facility] with rx for clonidine ( pt states this is his usual blood pressure medication, but had been out of it for approx 2 months).  Per chart review pt's blood pressure was not significantly elevated during first ED visit tonight.  However, upon arrival to [facility] his blood pressure was elevated prompting return to the ED.  Pt has no current symptoms or complaints.  Mobile crisis informs ED that patient needs to have rx for clonidine filled prior to coming back to [facility].   Past Medical History  Diagnosis Date  . Hypertension   . Asthma   . Pancreatitis   . Cocaine abuse     History reviewed. No pertinent past surgical history.  No family history on file.  History  Substance Use Topics  . Smoking status: Current Everyday Smoker  . Smokeless tobacco: Not on file  . Alcohol Use: Yes     heavily 3x/week      Review of Systems ROS reviewed and otherwise negative except for mentioned in HPI  Allergies  Shellfish-derived products  Home Medications   Current Outpatient Rx  Name Route Sig Dispense Refill  . ACETAMINOPHEN 500 MG PO TABS Oral Take 500 mg by mouth every 6 (six) hours as needed. For pain    . ALBUTEROL SULFATE HFA 108 (90 BASE) MCG/ACT IN AERS Inhalation Inhale 2 puffs into the lungs every 4 (four) hours as needed for wheezing or shortness of breath. Shortness of breath. Asthma 1 Inhaler 0  . CLONIDINE HCL 0.2 MG PO TABS Oral Take 1 tablet (0.2 mg  total) by mouth 2 (two) times daily. 60 tablet 0  . FAMOTIDINE 20 MG PO TABS Oral Take 1 tablet (20 mg total) by mouth 2 (two) times daily. 30 tablet 0  . LORAZEPAM 1 MG PO TABS Oral Take 1 tablet (1 mg total) by mouth 3 (three) times daily as needed for anxiety. 15 tablet 0  . ADULT MULTIVITAMIN W/MINERALS CH Oral Take 1 tablet by mouth daily.    . TRAMADOL HCL 50 MG PO TABS Oral Take 1 tablet (50 mg total) by mouth every 6 (six) hours as needed for pain. 15 tablet 0    BP 135/86  Pulse 97  Temp(Src) 98.1 F (36.7 C) (Oral)  Resp 14  SpO2 100% Vitals reviewed Physical Exam Physical Examination: General appearance - alert, well appearing, and in no distress Mental status - alert, oriented to person, place, and time Eyes - pupils equal and reactive Chest - clear to auscultation, no wheezes, rales or rhonchi, symmetric air entry Heart - normal rate, regular rhythm, normal S1, S2, no murmurs, rubs, clicks or gallops Neurological - alert, oriented, normal speech, no focal findings, normal strength and sensation in extremities x 4 Musculoskeletal - no joint tenderness, deformity or swelling Extremities - peripheral pulses normal, no pedal edema, no clubbing or cyanosis Skin - normal coloration and turgor, no rashes  ED Course  Procedures (including critical care time)  Labs Reviewed - No data to display No results found.   1. Substance abuse   2. Hypertension       MDM  Pt presenting with hypertension after recent discharge from ED tonight- pt had been medically cleared and discharged with rx for clonidine.  Upon arrival to [facility] pt was noted to be hypertensive.   [facility] has said that he will need to have clonidine prescription filled.  Pt given clonidine in ED tonight.  BP improved, he is asymptomatic.  Pt cleared to return to [facility].          Ethelda Chick, MD 07/14/11 [phone]

## 2011-07-11 NOTE — ED Notes (Signed)
Report received from [place], [place], care assumed.  Pt sleeping but awakens to voice.  No complaints.  Pt informed of plan of care.

## 2011-07-11 NOTE — Discharge Instructions (Signed)
Arterial Hypertension Arterial hypertension (high blood pressure) is a condition of elevated pressure in your blood vessels. Hypertension over a long period of time is a risk factor for strokes, heart attacks, and heart failure. It is also the leading cause of kidney (renal) failure.  CAUSES   In Adults -- Over 90% of all hypertension has no known cause. This is called essential or primary hypertension. In the other 10% of people with hypertension, the increase in blood pressure is caused by another disorder. This is called secondary hypertension. Important causes of secondary hypertension are:   Heavy alcohol use.   Obstructive sleep apnea.   Hyperaldosterosim (Conn's syndrome).   Steroid use.   Chronic kidney failure.   Hyperparathyroidism.   Medications.   Renal artery stenosis.   Pheochromocytoma.   Cushing's disease.   Coarctation of the aorta.   Scleroderma renal crisis.   Licorice (in excessive amounts).   Drugs (cocaine, methamphetamine).  Your caregiver can explain any items above that apply to you.  In Children -- Secondary hypertension is more common and should always be considered.   Pregnancy -- Few women of childbearing age have high blood pressure. However, up to 10% of them develop hypertension of pregnancy. Generally, this will not harm the woman. It Even be a sign of 3 complications of pregnancy: preeclampsia, HELLP syndrome, and eclampsia. Follow up and control with medication is necessary.  SYMPTOMS   This condition normally does not produce any noticeable symptoms. It is usually found during a routine exam.   Malignant hypertension is a late problem of high blood pressure. It Monger have the following symptoms:   Headaches.   Blurred vision.   End-organ damage (this means your kidneys, heart, lungs, and other organs are being damaged).   Stressful situations can increase the blood pressure. If a person with normal blood pressure has their blood  pressure go up while being seen by their caregiver, this is often termed "white coat hypertension." Its importance is not known. It Alkire be related with eventually developing hypertension or complications of hypertension.   Hypertension is often confused with mental tension, stress, and anxiety.  DIAGNOSIS  The diagnosis is made by 3 separate blood pressure measurements. They are taken at least 1 week apart from each other. If there is organ damage from hypertension, the diagnosis Lemire be made without repeat measurements. Hypertension is usually identified by having blood pressure readings:  Above 140/90 mmHg measured in both arms, at 3 separate times, over a couple weeks.   Over 130/80 mmHg should be considered a risk factor and Grisanti require treatment in patients with diabetes.  Blood pressure readings over 120/80 mmHg are called "pre-hypertension" even in non-diabetic patients. To get a true blood pressure measurement, use the following guidelines. Be aware of the factors that can alter blood pressure readings.  Take measurements at least 1 hour after caffeine.   Take measurements 30 minutes after smoking and without any stress. This is another reason to quit smoking - it raises your blood pressure.   Use a proper cuff size. Ask your caregiver if you are not sure about your cuff size.   Most home blood pressure cuffs are automatic. They will measure systolic and diastolic pressures. The systolic pressure is the pressure reading at the start of sounds. Diastolic pressure is the pressure at which the sounds disappear. If you are elderly, measure pressures in multiple postures. Try sitting, lying or standing.   Sit at rest for a minimum of   5 minutes before taking measurements.   You should not be on any medications like decongestants. These are found in many cold medications.   Record your blood pressure readings and review them with your caregiver.  If you have hypertension:  Your caregiver  may do tests to be sure you do not have secondary hypertension (see "causes" above).   Your caregiver may also look for signs of metabolic syndrome. This is also called Syndrome X or Insulin Resistance Syndrome. You may have this syndrome if you have type 2 diabetes, abdominal obesity, and abnormal blood lipids in addition to hypertension.   Your caregiver will take your medical and family history and perform a physical exam.   Diagnostic tests may include blood tests (for glucose, cholesterol, potassium, and kidney function), a urinalysis, or an EKG. Other tests may also be necessary depending on your condition.  PREVENTION  There are important lifestyle issues that you can adopt to reduce your chance of developing hypertension:  Maintain a normal weight.   Limit the amount of salt (sodium) in your diet.   Exercise often.   Limit alcohol intake.   Get enough potassium in your diet. Discuss specific advice with your caregiver.   Follow a DASH diet (dietary approaches to stop hypertension). This diet is rich in fruits, vegetables, and low-fat dairy products, and avoids certain fats.  PROGNOSIS  Essential hypertension cannot be cured. Lifestyle changes and medical treatment can lower blood pressure and reduce complications. The prognosis of secondary hypertension depends on the underlying cause. Many people whose hypertension is controlled with medicine or lifestyle changes can live a normal, healthy life.  RISKS AND COMPLICATIONS  While high blood pressure alone is not an illness, it often requires treatment due to its short- and long-term effects on many organs. Hypertension increases your risk for:  CVAs or strokes (cerebrovascular accident).   Heart failure due to chronically high blood pressure (hypertensive cardiomyopathy).   Heart attack (myocardial infarction).   Damage to the retina (hypertensive retinopathy).   Kidney failure (hypertensive nephropathy).  Your caregiver can  explain list items above that apply to you. Treatment of hypertension can significantly reduce the risk of complications. TREATMENT   For overweight patients, weight loss and regular exercise are recommended. Physical fitness lowers blood pressure.   Mild hypertension is usually treated with diet and exercise. A diet rich in fruits and vegetables, fat-free dairy products, and foods low in fat and salt (sodium) can help lower blood pressure. Decreasing salt intake decreases blood pressure in a 1/3 of people.   Stop smoking if you are a smoker.  The steps above are highly effective in reducing blood pressure. While these actions are easy to suggest, they are difficult to achieve. Most patients with moderate or severe hypertension end up requiring medications to bring their blood pressure down to a normal level. There are several classes of medications for treatment. Blood pressure pills (antihypertensives) will lower blood pressure by their different actions. Lowering the blood pressure by 10 mmHg may decrease the risk of complications by as much as 25%. The goal of treatment is effective blood pressure control. This will reduce your risk for complications. Your caregiver will help you determine the best treatment for you according to your lifestyle. What is excellent treatment for one person, may not be for you. HOME CARE INSTRUCTIONS   Do not smoke.   Follow the lifestyle changes outlined in the "Prevention" section.   If you are on medications, follow the directions   carefully. Blood pressure medications must be taken as prescribed. Skipping doses reduces their benefit. It also puts you at risk for problems.   Follow up with your caregiver, as directed.   If you are asked to monitor your blood pressure at home, follow the guidelines in the "Diagnosis" section above.  SEEK MEDICAL CARE IF:   You think you are having medication side effects.   You have recurrent headaches or lightheadedness.     You have swelling in your ankles.   You have trouble with your vision.  SEEK IMMEDIATE MEDICAL CARE IF:   You have sudden onset of chest pain or pressure, difficulty breathing, or other symptoms of a heart attack.   You have a severe headache.   You have symptoms of a stroke (such as sudden weakness, difficulty speaking, difficulty walking).  MAKE SURE YOU:   Understand these instructions.   Will watch your condition.   Will get help right away if you are not doing well or get worse.  Document Released: 04/25/2005 Document Revised: 04/14/2011 Document Reviewed: 11/23/2006 [facility] Patient Information 2012 [place], [place].Drug Abuse and Addiction in Sports There are many types of drugs that one may become addicted to including illegal drugs (marijuana, cocaine, amphetamines, hallucinogens, and narcotics), prescription drugs (hydrocodone, codeine, and alprazolam), and other chemicals such as alcohol or nicotine. Two types of addiction exist: physical and emotional. Physical addiction usually occurs after prolonged use of a drug. However, some drugs may only take a couple uses before addiction can occur. Physical addiction is marked by withdrawal symptoms, in which the person experiences negative symptoms such as sweat, anxiety, tremors, hallucinations, or cravings in the absence of using the drug. Emotional dependence is the psychological desire for the "high" that the drugs produce when taken. SYMPTOMS   Inattentiveness.   Negligence.   Forgetfulness.   Insomnia.   Mood swings.  RISK INCREASES WITH:   Family history of addiction.   Personal history of addictive personality.  Studies have shown that risktakers, which many athletes are, have a higher risk of addiction. PREVENTION The only adequate prevention of drug abuse is abstinence from drugs. TREATMENT  The first step in quitting substance abuse is recognizing the problem and realizing that one has the power to change.  Quitting requires a plan and support from others. It is often necessary to seek medical assistance. Caregivers are available to offer counseling, and for certain cases, medicine to diminish the physical symptoms of withdrawal. Many organizations exist such as Alcoholics Anonymous, Narcotics Anonymous, or the [employer] on Alcoholism that offer support for individuals who have chosen to quit their habits. Document Released: 04/25/2005 Document Revised: 04/14/2011 Document Reviewed: 08/07/2008 [facility] Patient Information 2012 [place], [place].

## 2011-08-14 ENCOUNTER — Emergency Department (HOSPITAL_COMMUNITY): Payer: Self-pay

## 2011-08-14 ENCOUNTER — Emergency Department (HOSPITAL_COMMUNITY)
Admission: EM | Admit: 2011-08-14 | Discharge: 2011-08-14 | Disposition: A | Discharge status: Home / Self Care | Payer: Self-pay | Attending: Emergency Medicine | Admitting: Emergency Medicine

## 2011-08-14 ENCOUNTER — Encounter (HOSPITAL_COMMUNITY): Payer: Self-pay | Admitting: *Deleted

## 2011-08-14 DIAGNOSIS — R51 Headache: Secondary | ICD-10-CM | POA: Insufficient documentation

## 2011-08-14 DIAGNOSIS — Z79899 Other long term (current) drug therapy: Secondary | ICD-10-CM | POA: Insufficient documentation

## 2011-08-14 DIAGNOSIS — R110 Nausea: Secondary | ICD-10-CM | POA: Insufficient documentation

## 2011-08-14 DIAGNOSIS — S1093XA Contusion of unspecified part of neck, initial encounter: Secondary | ICD-10-CM | POA: Insufficient documentation

## 2011-08-14 DIAGNOSIS — M542 Cervicalgia: Secondary | ICD-10-CM | POA: Insufficient documentation

## 2011-08-14 DIAGNOSIS — S060X0A Concussion without loss of consciousness, initial encounter: Secondary | ICD-10-CM | POA: Insufficient documentation

## 2011-08-14 DIAGNOSIS — R42 Dizziness and giddiness: Secondary | ICD-10-CM | POA: Insufficient documentation

## 2011-08-14 DIAGNOSIS — I10 Essential (primary) hypertension: Secondary | ICD-10-CM | POA: Insufficient documentation

## 2011-08-14 DIAGNOSIS — R040 Epistaxis: Secondary | ICD-10-CM | POA: Insufficient documentation

## 2011-08-14 DIAGNOSIS — J45909 Unspecified asthma, uncomplicated: Secondary | ICD-10-CM | POA: Insufficient documentation

## 2011-08-14 DIAGNOSIS — S0003XA Contusion of scalp, initial encounter: Secondary | ICD-10-CM | POA: Insufficient documentation

## 2011-08-14 DIAGNOSIS — S0100XA Unspecified open wound of scalp, initial encounter: Secondary | ICD-10-CM | POA: Insufficient documentation

## 2011-08-14 MED ORDER — IBUPROFEN 800 MG PO TABS: 800.0000 mg | ORAL_TABLET | Freq: Three times a day (TID) | ORAL | Status: AC

## 2011-08-14 MED ORDER — KETOROLAC TROMETHAMINE 30 MG/ML IJ SOLN
30.0000 mg | Freq: Once | INTRAMUSCULAR | Status: AC
  Administered 2011-08-14: 30 mg via INTRAVENOUS
  Filled 2011-08-14: qty 1

## 2011-08-14 MED ORDER — TRAMADOL HCL 50 MG PO TABS: 50.0000 mg | ORAL_TABLET | Freq: Four times a day (QID) | ORAL | Status: AC | PRN

## 2011-08-14 MED ORDER — DIPHENHYDRAMINE HCL 50 MG/ML IJ SOLN
25.0000 mg | Freq: Once | INTRAMUSCULAR | Status: AC
  Administered 2011-08-14: 25 mg via INTRAVENOUS
  Filled 2011-08-14: qty 1

## 2011-08-14 MED ORDER — SODIUM CHLORIDE 0.9 % IV BOLUS (SEPSIS)
1000.0000 mL | Freq: Once | INTRAVENOUS | Status: AC
  Administered 2011-08-14: 1000 mL via INTRAVENOUS

## 2011-08-14 MED ORDER — DEXAMETHASONE SODIUM PHOSPHATE 10 MG/ML IJ SOLN
10.0000 mg | Freq: Once | INTRAMUSCULAR | Status: AC
  Administered 2011-08-14: 10 mg via INTRAVENOUS
  Filled 2011-08-14: qty 1

## 2011-08-14 MED ORDER — METOCLOPRAMIDE HCL 5 MG/ML IJ SOLN
10.0000 mg | Freq: Once | INTRAMUSCULAR | Status: AC
  Administered 2011-08-14: 10 mg via INTRAVENOUS
  Filled 2011-08-14: qty 2

## 2011-08-14 NOTE — Discharge Instructions (Signed)
Concussion and Brain Injury A blow or jolt to the head can disrupt the normal function of the brain. This type of brain injury is often called a "concussion" or a "closed head injury." Concussions are usually not life-threatening. Even so, the effects of a concussion can be serious.  CAUSES  A concussion is caused by a blunt blow to the head. The blow might be direct or indirect as described below.  Direct blow (running into another player during a soccer game, being hit in a fight, or hitting your head on a hard surface).   Indirect blow (when your head moves rapidly and violently back and forth like in a car crash).  SYMPTOMS  The brain is very complex. Every head injury is different. Some symptoms may appear right away. Other symptoms may not show up for days or weeks after the concussion. The signs of concussion can be hard to notice. Early on, problems may be missed by patients, family members, and caregivers. You may look fine even though you are acting or feeling differently.  These symptoms are usually temporary, but may last for days, weeks, or even longer. Symptoms include:  Mild headaches that will not go away.   Having more trouble than usual with:   Remembering things.   Paying attention or concentrating.   Organizing daily tasks.   Making decisions and solving problems.   Slowness in thinking, acting, speaking, or reading.   Getting lost or easily confused.   Feeling tired all the time or lacking energy (fatigue).   Feeling drowsy.   Sleep disturbances.   Sleeping more than usual.   Sleeping less than usual.   Trouble falling asleep.   Trouble sleeping (insomnia).   Loss of balance or feeling lightheaded or dizzy.   Nausea or vomiting.   Numbness or tingling.   Increased sensitivity to:   Sounds.   Lights.   Distractions.  Other symptoms might include:  Vision problems or eyes that tire easily.   Diminished sense of taste or smell.   Ringing  in the ears.   Mood changes such as feeling sad, anxious, or listless.   Becoming easily irritated or angry for little or no reason.   Lack of motivation.  DIAGNOSIS  Your caregiver can usually diagnose a concussion or mild brain injury based on your description of your injury and your symptoms.  Your evaluation might include:  A brain scan to look for signs of injury to the brain. Even if the test shows no injury, you may still have a concussion.   Blood tests to be sure other problems are not present.  TREATMENT   People with a concussion need to be examined and evaluated. Most people with concussions are treated in an emergency department, urgent care, or clinic. Some people must stay in the hospital overnight for further treatment.   Your caregiver will send you home with important instructions to follow. Be sure to carefully follow them.   Tell your caregiver if you are already taking any medicines (prescription, over-the-counter, or natural remedies), or if you are drinking alcohol or taking illegal drugs. Also, talk with your caregiver if you are taking blood thinners (anticoagulants) or aspirin. These drugs may increase your chances of complications. All of this is important information that may affect treatment.   Only take over-the-counter or prescription medicines for pain, discomfort, or fever as directed by your caregiver.  PROGNOSIS  How fast people recover from brain injury varies from person to person.   Although most people have a good recovery, how quickly they improve depends on many factors. These factors include how severe their concussion was, what part of the brain was injured, their age, and how healthy they were before the concussion.  Because all head injuries are different, so is recovery. Most people with mild injuries recover fully. Recovery can take time. In general, recovery is slower in older persons. Also, persons who have had a concussion in the past or have  other medical problems may find that it takes longer to recover from their current injury. Anxiety and depression may also make it harder to adjust to the symptoms of brain injury. HOME CARE INSTRUCTIONS  Return to your normal activities slowly, not all at once. You must give your body and brain enough time for recovery.  Get plenty of sleep at night, and rest during the day. Rest helps the brain to heal.   Avoid staying up late at night.   Keep the same bedtime hours on weekends and weekdays.   Take daytime naps or rest breaks when you feel tired.   Limit activities that require a lot of thought or concentration (brain or cognitive rest). This includes:   Homework or job-related work.   Watching TV.   Computer work.   Avoid activities that could lead to a second brain injury, such as contact or recreational sports, until your caregiver says it is okay. Even after your brain injury has healed, you should protect yourself from having another concussion.   Ask your caregiver when you can return to your normal activities such as driving, bicycling, or operating heavy equipment. Your ability to react may be slower after a brain injury.   Talk with your caregiver about when you can return to work or school.   Inform your teachers, school nurse, school counselor, coach, Product/process development scientist, or work Freight forwarder about your injury, symptoms, and restrictions. They should be instructed to report:   Increased problems with attention or concentration.   Increased problems remembering or learning new information.   Increased time needed to complete tasks or assignments.   Increased irritability or decreased ability to cope with stress.   Increased symptoms.   Take only those medicines that your caregiver has approved.   Do not drink alcohol until your caregiver says you are well enough to do so. Alcohol and certain other drugs may slow your recovery and can put you at risk of further injury.    If it is harder than usual to remember things, write them down.   If you are easily distracted, try to do one thing at a time. For example, do not try to watch TV while fixing dinner.   Talk with family members or close friends when making important decisions.   Keep all follow-up appointments. Repeated evaluation of your symptoms is recommended for your recovery.  PREVENTION  Protect your head from future injury. It is very important to avoid another head or brain injury before you have recovered. In rare cases, another injury has lead to permanent brain damage, brain swelling, or death. Avoid injuries by using:  Seatbelts when riding in a car.   Alcohol only in moderation.   A helmet when biking, skiing, skateboarding, skating, or doing similar activities.   Safety measures in your home.   Remove clutter and tripping hazards from floors and stairways.   Use grab bars in bathrooms and handrails by stairs.   Place non-slip mats on floors and in bathtubs.  Improve lighting in dim areas.  SEEK MEDICAL CARE IF:  A head injury can cause lingering symptoms. You should seek medical care if you have any of the following symptoms for more than 3 weeks after your injury or are planning to return to sports:  Chronic headaches.   Dizziness or balance problems.   Nausea.   Vision problems.   Increased sensitivity to noise or light.   Depression or mood swings.   Anxiety or irritability.   Memory problems.   Difficulty concentrating or paying attention.   Sleep problems.   Feeling tired all the time.  SEEK IMMEDIATE MEDICAL CARE IF:  You have had a blow or jolt to the head and you (or your family or friends) notice:  Severe or worsening headaches.   Weakness (even if only in one hand or one leg or one part of the face), numbness, or decreased coordination.   Repeated vomiting.   Increased sleepiness or passing out.   One black center of the eye (pupil) is larger  than the other.   Convulsions (seizures).   Slurred speech.   Increasing confusion, restlessness, agitation, or irritability.   Lack of ability to recognize people or places.   Neck pain.   Difficulty being awakened.   Unusual behavior changes.   Loss of consciousness.  Older adults with a brain injury may have a higher risk of serious complications such as a blood clot on the brain. Headaches that get worse or an increase in confusion are signs of this complication. If these signs occur, see a caregiver right away. MAKE SURE YOU:   Understand these instructions.   Will watch your condition.   Will get help right away if you are not doing well or get worse.  FOR MORE INFORMATION  Several groups help people with brain injury and their families. They provide information and put people in touch with local resources. These include support groups, rehabilitation services, and a variety of health care professionals. Among these groups, the Brain Injury Association (BIA, www.biausa.org) has a Secretary/administrator that gathers scientific and educational information and works on a national level to help people with brain injury.  Document Released: 07/16/2003 Document Revised: 04/14/2011 Document Reviewed: 12/12/2007 [facility] Patient Information 2012 [place], [place].  Head Injury, Adult You have had a head injury that does not appear serious at this time. A concussion is a state of changed mental ability, usually from a blow to the head. You should take clear liquids for the rest of the day and then resume your regular diet. You should not take sedatives or alcoholic beverages for as long as directed by your caregiver after discharge. After injuries such as yours, most problems occur within the first 24 hours. SYMPTOMS These minor symptoms may be experienced after discharge:  Memory difficulties.   Dizziness.   Headaches.   Double vision.   Hearing difficulties.   Depression.    Tiredness.   Weakness.   Difficulty with concentration.  If you experience any of these problems, you should not be alarmed. A concussion requires a few days for recovery. Many patients with head injuries frequently experience such symptoms. Usually, these problems disappear without medical care. If symptoms last for more than one day, notify your caregiver. See your caregiver sooner if symptoms are becoming worse rather than better. HOME CARE INSTRUCTIONS   During the next 24 hours you must stay with someone who can watch you for the warning signs listed below.  Although it is  unlikely that serious side effects will occur, you should be aware of signs and symptoms which may necessitate your return to this location. Side effects may occur up to 7 - 10 days following the injury. It is important for you to carefully monitor your condition and contact your caregiver or seek immediate medical attention if there is a change in your condition. SEEK IMMEDIATE MEDICAL CARE IF:   There is confusion or drowsiness.   You can not awaken the injured person.   There is nausea (feeling sick to your stomach) or continued, forceful vomiting.   You notice dizziness or unsteadiness which is getting worse, or inability to walk.   You have convulsions or unconsciousness.   You experience severe, persistent headaches not relieved by over-the-counter or prescription medicines for pain. (Do not take aspirin as this impairs clotting abilities). Take other pain medications only as directed.   You can not use arms or legs normally.   There is clear or bloody discharge from the nose or ears.  MAKE SURE YOU:   Understand these instructions.   Will watch your condition.   Will get help right away if you are not doing well or get worse.  Document Released: 04/25/2005 Document Revised: 04/14/2011 Document Reviewed: 03/13/2009 [facility] Patient Information 2012 [place], [place].  Post-Concussion  Syndrome Post-concussion syndrome describes the symptoms that can occur after a head injury. These symptoms can last from weeks to months. CAUSES  It is not clear why some head injuries cause post-concussion syndrome. It can occur whether your head injury was mild or severe and whether you were wearing head protection or not.  SYMPTOMS  Memory difficulties.   Dizziness.   Headaches.   Double vision or blurry vision.   Sensitivity to light.   Hearing difficulties.   Depression.   Tiredness.   Weakness.   Difficulty with concentration.   Difficulty sleeping or staying asleep.   Vomiting.  DIAGNOSIS  There is no test to determine whether you have post-concussion syndrome. Your caregiver may order an imaging scan of your brain, such as a CT scan, to check for other problems that may be causing your symptoms (such as severe injury inside your skull). TREATMENT  Usually, these problems disappear over time without medical care. Your caregiver may prescribe medicine to help ease your symptoms. It is important to follow up with a neurologist to evaluate your recovery and address any lingering symptoms or issues. HOME CARE INSTRUCTIONS   Only take over-the-counter or prescription medicines for pain, discomfort, or fever as directed by your caregiver. Do not take aspirin. Aspirin can slow blood clotting.   Sleep with your head slightly elevated to help with headaches.   Avoid any situation where there is potential for another head injury (football, hockey, martial arts, horseback riding). Your condition will get worse every time you experience a concussion. You should avoid these activities until you are evaluated by the appropriate follow-up caregivers.   Keep all follow-up appointments as directed by your caregiver.  SEEK IMMEDIATE MEDICAL CARE IF:  You develop confusion or unusual drowsiness.   You cannot wake the injured person.   You develop nausea or persistent, forceful  vomiting.   You feel like you are moving when you are not (vertigo).   You notice the injured person's eyes moving rapidly back and forth. This may be a sign of vertigo.   You have convulsions or faint.   You have severe, persistent headaches that are not relieved by medicine.  You cannot use your arms or legs normally.   Your pupils change size.   You have clear or bloody discharge from the nose or ears.   Your problems are getting worse, not better.  MAKE SURE YOU:  Understand these instructions.   Will watch your condition.   Will get help right away if you are not doing well or get worse.  Document Released: 10/15/2001 Document Revised: 04/14/2011 Document Reviewed: 11/11/2010 [facility] Patient Information 2012 [place], [place].
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
## 2011-08-14 NOTE — ED Provider Notes (Signed)
Medical screening examination/treatment/procedure(s) were performed by non-physician practitioner and as supervising physician I was immediately available for consultation/collaboration.   Celene Kras, MD 08/14/11 1322
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
## 2011-08-24 IMAGING — CR DG ABDOMEN ACUTE W/ 1V CHEST
3 series · 3 of 3 positions shown · non-contrast
Comparison: PA and lateral chest 08/13/2008.

CLINICAL DATA: Abdominal pain.

ACUTE ABDOMEN SERIES (ABDOMEN 2 VIEW & CHEST 1 VIEW)

[w chest pa]
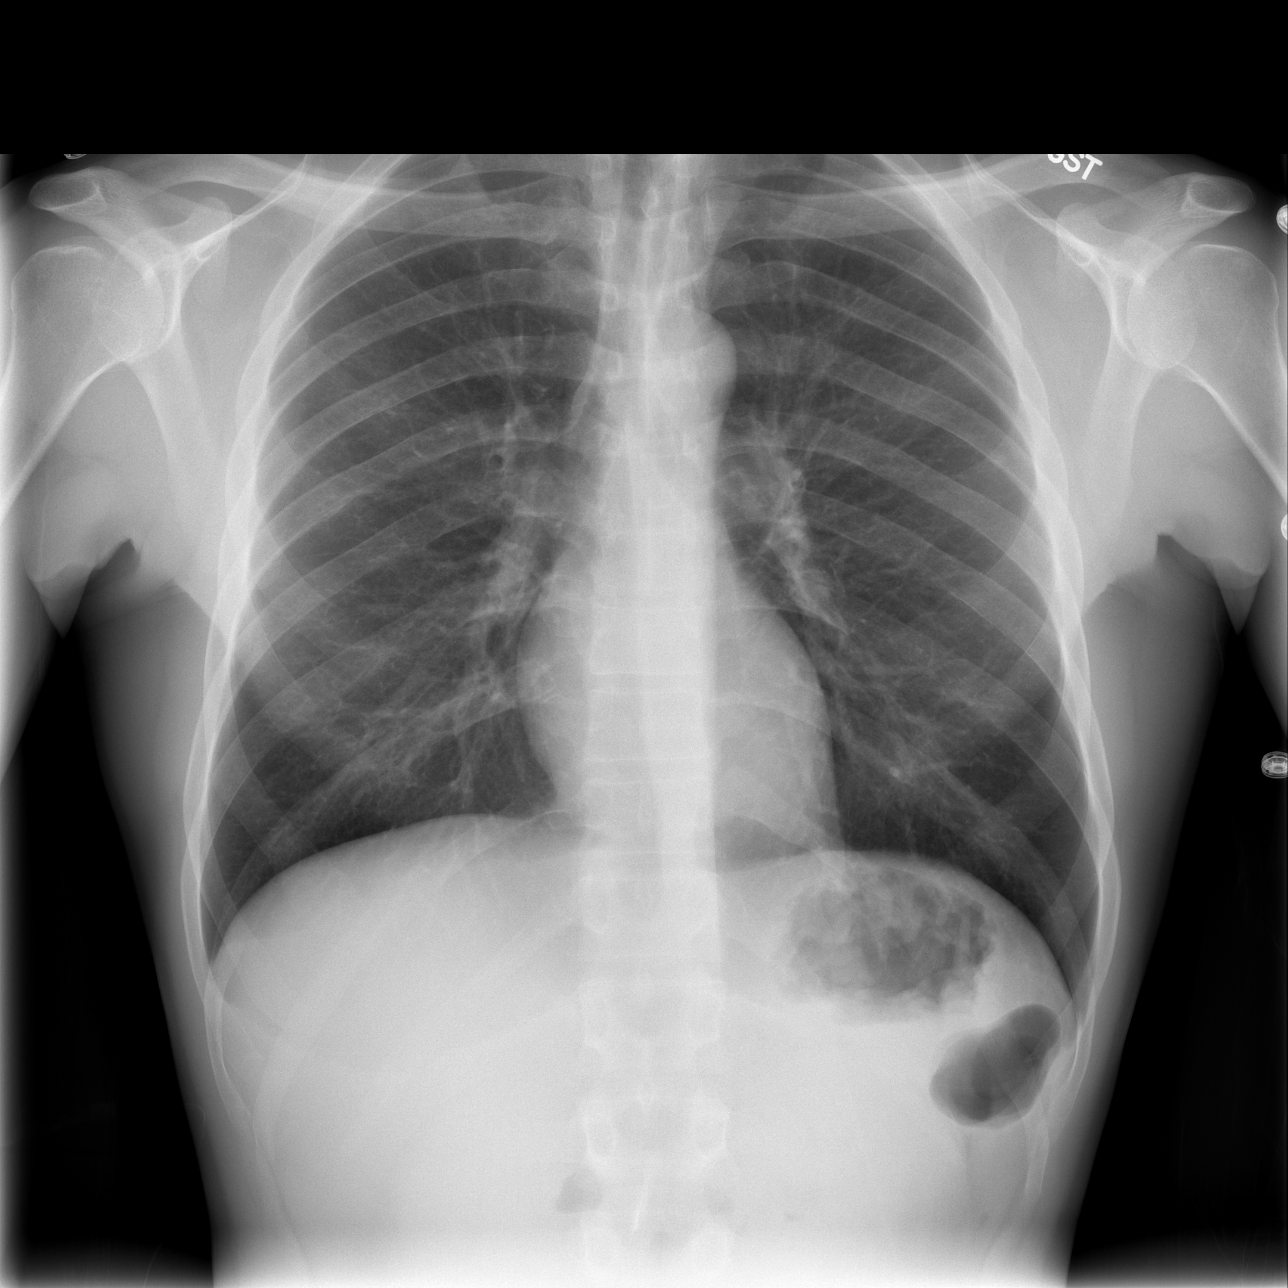

[w abdomen upright]
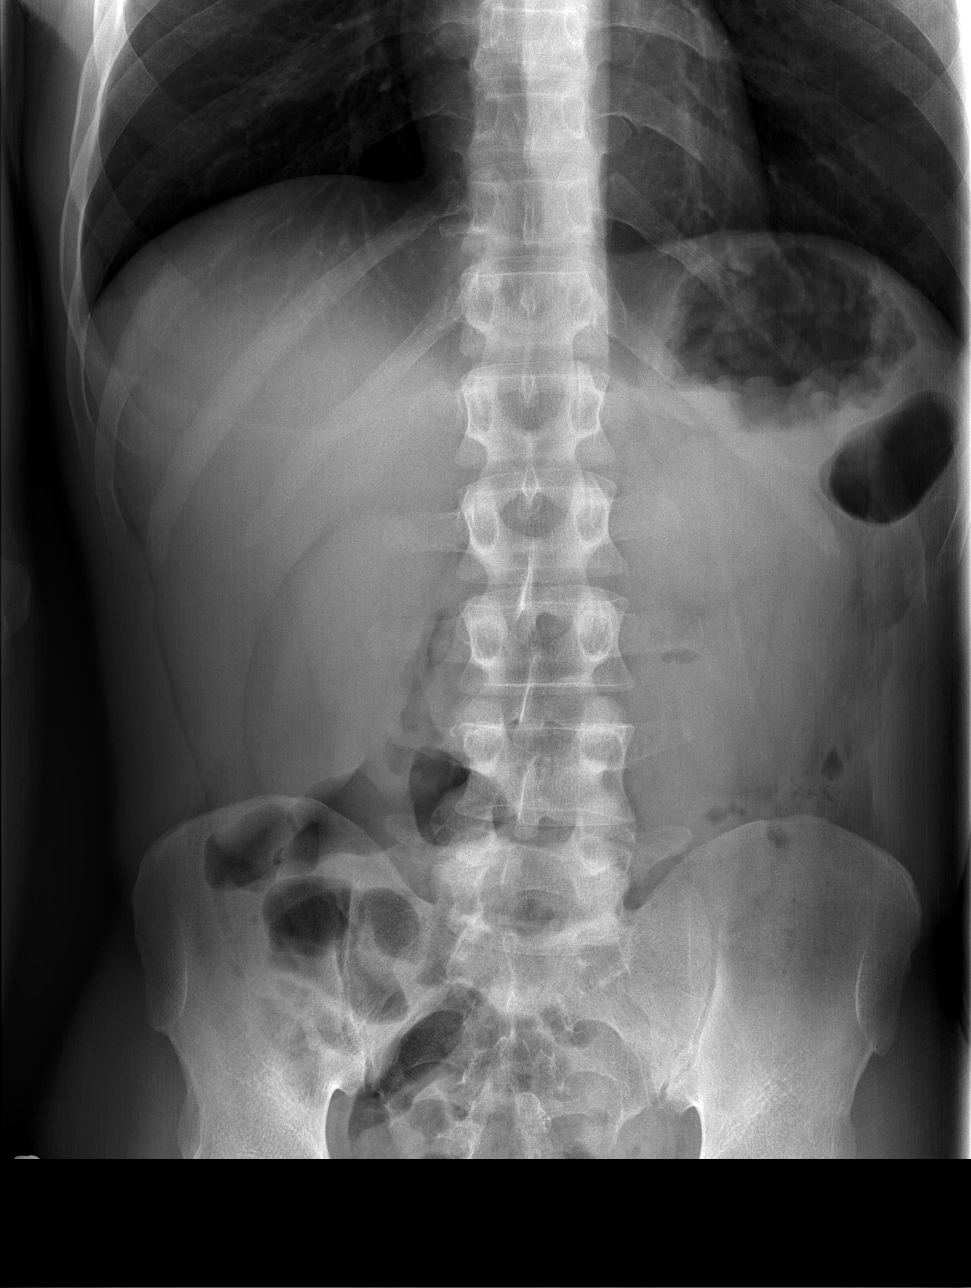

[t abdomen supine]
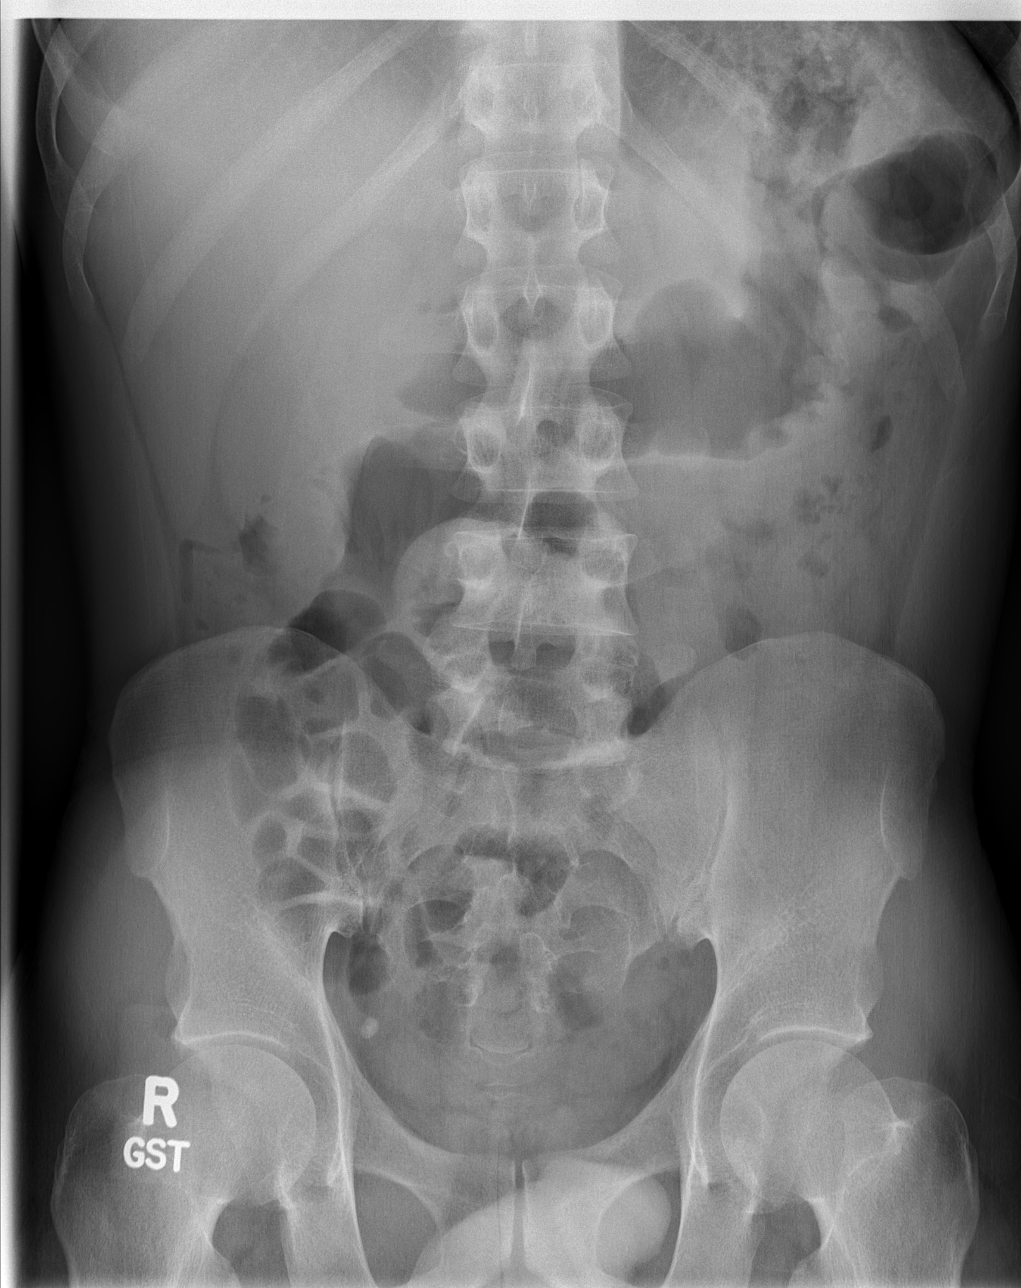

[3 of 3 positions shown; findings below may reference images not displayed]

FINDINGS: Lungs clear.  No pleural effusion.  Heart size is normal.

Two views of the abdomen show no free peritoneal air.  Bowel gas
pattern is normal.
IMPRESSION: Negative exam.

## 2011-09-28 ENCOUNTER — Encounter (HOSPITAL_COMMUNITY): Payer: Self-pay | Admitting: *Deleted

## 2011-09-28 ENCOUNTER — Inpatient Hospital Stay (HOSPITAL_COMMUNITY)
Admission: RE | Admit: 2011-09-28 | Discharge: 2011-10-04 | DRG: 896 | Disposition: A | Discharge status: Home / Self Care | Payer: No Typology Code available for payment source | Attending: Emergency Medicine | Admitting: Emergency Medicine

## 2011-09-28 DIAGNOSIS — R000 Tachycardia, unspecified: Secondary | ICD-10-CM | POA: Diagnosis present

## 2011-09-28 DIAGNOSIS — K219 Gastro-esophageal reflux disease without esophagitis: Secondary | ICD-10-CM | POA: Diagnosis present

## 2011-09-28 DIAGNOSIS — R7402 Elevation of levels of lactic acid dehydrogenase (LDH): Secondary | ICD-10-CM | POA: Diagnosis present

## 2011-09-28 DIAGNOSIS — J45909 Unspecified asthma, uncomplicated: Secondary | ICD-10-CM | POA: Diagnosis present

## 2011-09-28 DIAGNOSIS — F1210 Cannabis abuse, uncomplicated: Secondary | ICD-10-CM | POA: Diagnosis present

## 2011-09-28 DIAGNOSIS — F1020 Alcohol dependence, uncomplicated: Principal | ICD-10-CM | POA: Diagnosis present

## 2011-09-28 DIAGNOSIS — E079 Disorder of thyroid, unspecified: Secondary | ICD-10-CM | POA: Diagnosis present

## 2011-09-28 DIAGNOSIS — C8589 Other specified types of non-Hodgkin lymphoma, extranodal and solid organ sites: Secondary | ICD-10-CM | POA: Diagnosis present

## 2011-09-28 DIAGNOSIS — E46 Unspecified protein-calorie malnutrition: Secondary | ICD-10-CM | POA: Diagnosis present

## 2011-09-28 DIAGNOSIS — I10 Essential (primary) hypertension: Secondary | ICD-10-CM | POA: Diagnosis present

## 2011-09-28 DIAGNOSIS — D539 Nutritional anemia, unspecified: Secondary | ICD-10-CM | POA: Diagnosis present

## 2011-09-28 DIAGNOSIS — K859 Acute pancreatitis without necrosis or infection, unspecified: Secondary | ICD-10-CM | POA: Diagnosis present

## 2011-09-28 DIAGNOSIS — F1010 Alcohol abuse, uncomplicated: Secondary | ICD-10-CM | POA: Diagnosis present

## 2011-09-28 DIAGNOSIS — R7401 Elevation of levels of liver transaminase levels: Secondary | ICD-10-CM | POA: Diagnosis present

## 2011-09-28 DIAGNOSIS — F17200 Nicotine dependence, unspecified, uncomplicated: Secondary | ICD-10-CM | POA: Diagnosis present

## 2011-09-28 DIAGNOSIS — R599 Enlarged lymph nodes, unspecified: Secondary | ICD-10-CM | POA: Diagnosis present

## 2011-09-28 HISTORY — DX: Major depressive disorder, single episode, unspecified: F32.9

## 2011-09-28 HISTORY — DX: Depression, unspecified: F32.A

## 2011-09-28 LAB — CBC
Hemoglobin: 10.1 g/dL — ABNORMAL LOW (ref 13.0–17.0)
MCH: 31.8 pg (ref 26.0–34.0)
MCHC: 32.8 g/dL (ref 30.0–36.0)
MCV: 96.9 fL (ref 78.0–100.0)
Platelets: 189 10*3/uL (ref 150–400)
RBC: 3.18 MIL/uL — ABNORMAL LOW (ref 4.22–5.81)

## 2011-09-28 LAB — COMPREHENSIVE METABOLIC PANEL
ALT: 123 U/L — ABNORMAL HIGH (ref 0–53)
AST: 127 U/L — ABNORMAL HIGH (ref 0–37)
Albumin: 3.2 g/dL — ABNORMAL LOW (ref 3.5–5.2)
Alkaline Phosphatase: 108 U/L (ref 39–117)
CO2: 29 mEq/L (ref 19–32)
Calcium: 9.1 mg/dL (ref 8.4–10.5)
Creatinine, Ser: 0.83 mg/dL (ref 0.50–1.35)
Glucose, Bld: 79 mg/dL (ref 70–99)
Total Bilirubin: 0.5 mg/dL (ref 0.3–1.2)

## 2011-09-28 LAB — ETHANOL: Alcohol, Ethyl (B): <11 mg/dL (ref 0–11)

## 2011-09-28 MED ORDER — CLONIDINE HCL 0.2 MG PO TABS
0.2000 mg | ORAL_TABLET | ORAL | Status: AC
  Administered 2011-09-28: 0.2 mg via ORAL
  Filled 2011-09-28: qty 1
  Filled 2011-09-28: qty 2

## 2011-09-28 MED ORDER — CITALOPRAM HYDROBROMIDE 10 MG PO TABS
10.0000 mg | ORAL_TABLET | Freq: Every day | ORAL | Status: DC
  Administered 2011-09-28 – 2011-10-03 (×6): 10 mg via ORAL
  Filled 2011-09-28 (×11): qty 1

## 2011-09-28 MED ORDER — HYDROXYZINE HCL 50 MG PO TABS: 50.0000 mg | ORAL_TABLET | Freq: Every evening | ORAL | Status: DC | PRN

## 2011-09-28 MED ORDER — MAGNESIUM HYDROXIDE 400 MG/5ML PO SUSP: 30.0000 mL | Freq: Every day | ORAL | Status: DC | PRN

## 2011-09-28 MED ORDER — THIAMINE HCL 100 MG/ML IJ SOLN
100.0000 mg | Freq: Once | INTRAMUSCULAR | Status: AC
  Administered 2011-09-28: 100 mg via INTRAMUSCULAR

## 2011-09-28 MED ORDER — CHLORDIAZEPOXIDE HCL 25 MG PO CAPS: 25.0000 mg | ORAL_CAPSULE | Freq: Every day | ORAL | Status: DC

## 2011-09-28 MED ORDER — CHLORDIAZEPOXIDE HCL 25 MG PO CAPS
25.0000 mg | ORAL_CAPSULE | Freq: Four times a day (QID) | ORAL | Status: DC
  Administered 2011-09-28 – 2011-09-29 (×2): 25 mg via ORAL
  Filled 2011-09-28 (×3): qty 1

## 2011-09-28 MED ORDER — RISPERIDONE 1 MG PO TABS
1.0000 mg | ORAL_TABLET | Freq: Every day | ORAL | Status: DC
  Filled 2011-09-28 (×3): qty 1

## 2011-09-28 MED ORDER — TRAZODONE HCL 100 MG PO TABS: 100.0000 mg | ORAL_TABLET | Freq: Every evening | ORAL | Status: DC | PRN

## 2011-09-28 MED ORDER — ALUM & MAG HYDROXIDE-SIMETH 200-200-20 MG/5ML PO SUSP
30.0000 mL | ORAL | Status: DC | PRN
  Administered 2011-09-28 – 2011-09-30 (×2): 30 mL via ORAL

## 2011-09-28 MED ORDER — HYDROXYZINE HCL 25 MG PO TABS
25.0000 mg | ORAL_TABLET | Freq: Four times a day (QID) | ORAL | Status: DC | PRN
  Administered 2011-09-29: 25 mg via ORAL
  Filled 2011-09-28: qty 1

## 2011-09-28 MED ORDER — CLONIDINE HCL 0.2 MG PO TABS
0.2000 mg | ORAL_TABLET | Freq: Two times a day (BID) | ORAL | Status: DC
  Administered 2011-09-28 – 2011-10-04 (×12): 0.2 mg via ORAL
  Filled 2011-09-28 (×2): qty 1
  Filled 2011-09-28: qty 28
  Filled 2011-09-28 (×5): qty 1
  Filled 2011-09-28: qty 28
  Filled 2011-09-28 (×5): qty 1
  Filled 2011-09-28: qty 2
  Filled 2011-09-28 (×2): qty 1

## 2011-09-28 MED ORDER — ONDANSETRON 4 MG PO TBDP: 4.0000 mg | ORAL_TABLET | Freq: Four times a day (QID) | ORAL | Status: AC | PRN

## 2011-09-28 MED ORDER — VITAMIN B-1 100 MG PO TABS
100.0000 mg | ORAL_TABLET | Freq: Every day | ORAL | Status: DC
  Administered 2011-09-29 – 2011-10-04 (×6): 100 mg via ORAL
  Filled 2011-09-28 (×8): qty 1

## 2011-09-28 MED ORDER — FAMOTIDINE 20 MG PO TABS
20.0000 mg | ORAL_TABLET | Freq: Two times a day (BID) | ORAL | Status: DC
  Administered 2011-09-28 – 2011-10-04 (×12): 20 mg via ORAL
  Filled 2011-09-28 (×11): qty 1
  Filled 2011-09-28 (×2): qty 28
  Filled 2011-09-28 (×4): qty 1

## 2011-09-28 MED ORDER — SUCRALFATE 1 GM/10ML PO SUSP
1.0000 g | Freq: Three times a day (TID) | ORAL | Status: DC
  Administered 2011-09-29 – 2011-10-04 (×22): 1 g via ORAL
  Filled 2011-09-28 (×30): qty 10

## 2011-09-28 MED ORDER — CHLORDIAZEPOXIDE HCL 25 MG PO CAPS
25.0000 mg | ORAL_CAPSULE | Freq: Once | ORAL | Status: AC
  Administered 2011-09-28: 25 mg via ORAL
  Filled 2011-09-28: qty 1

## 2011-09-28 MED ORDER — CHLORDIAZEPOXIDE HCL 25 MG PO CAPS: 25.0000 mg | ORAL_CAPSULE | Freq: Three times a day (TID) | ORAL | Status: DC

## 2011-09-28 MED ORDER — CHLORDIAZEPOXIDE HCL 25 MG PO CAPS: 25.0000 mg | ORAL_CAPSULE | ORAL | Status: DC

## 2011-09-28 MED ORDER — LOPERAMIDE HCL 2 MG PO CAPS: 2.0000 mg | ORAL_CAPSULE | ORAL | Status: AC | PRN

## 2011-09-28 MED ORDER — CHLORDIAZEPOXIDE HCL 25 MG PO CAPS: 25.0000 mg | ORAL_CAPSULE | Freq: Four times a day (QID) | ORAL | Status: DC | PRN

## 2011-09-28 MED ORDER — ALBUTEROL SULFATE HFA 108 (90 BASE) MCG/ACT IN AERS
2.0000 | INHALATION_SPRAY | RESPIRATORY_TRACT | Status: DC | PRN
  Administered 2011-09-30 – 2011-10-04 (×2): 2 via RESPIRATORY_TRACT
  Filled 2011-09-28: qty 6.7

## 2011-09-28 MED ORDER — ADULT MULTIVITAMIN W/MINERALS CH
1.0000 | ORAL_TABLET | Freq: Every day | ORAL | Status: DC
  Administered 2011-09-28 – 2011-10-04 (×7): 1 via ORAL
  Filled 2011-09-28 (×5): qty 1
  Filled 2011-09-28: qty 14
  Filled 2011-09-28 (×3): qty 1

## 2011-09-28 MED ORDER — SODIUM CHLORIDE 0.9 % IV SOLN
Freq: Once | INTRAVENOUS | Status: AC
  Administered 2011-09-28: via INTRAVENOUS

## 2011-09-28 NOTE — BH Assessment (Signed)
Assessment Note   [name] is an 41 y.o. male. PT PRESENTS WITH INCREASE DEPRESSION & PASSIVE THOUGHTS. PT EXPRESSED THAT HE HAS NOT BEEN MEDS FOR A FEW MONTHS & STATES HE IS HAVING A HARD TIME CONCENTRATING, HEARING THING AS WELL AS REOCCURRING BAD DREAMS. PT STATES HE HAS A HARD TIMES FOCUSING & TENDS TO FORGET THINGS. PT SAYS HE HAS NOT BEEN ABLE TO FIND A JOB & HIS FINANCES HAS JUST PILED UP. PT EXPRESSED THAT HE HAS NOT REASON TO LIVE EVENTHOUGH HE DENIES ANY IDEATION. PT IS EMOTIONAL & TEARFUL AS WELL AS HAVING FLIGHT OF IDEAS. PT WAS RAN BY DR. Allena Katz & Lynann Bologna, NP WHO RECOMMENDED A BREATHALIZER BE DONE AS WELL AS VITAL SIGNES. PT WAS CLEARED FROM HIS BREATHALIZER & VITAL SIGNS THEN WAS ADMITTED 303-1.   Axis I: Mood Disorder NOS & ALCOHOL ABUSE & CANNIBUS ABUSE Axis II: Deferred Axis III:  Past Medical History  Diagnosis Date  . Hypertension   . Asthma   . Pancreatitis   . Cocaine abuse    Axis IV: other psychosocial or environmental problems, problems related to social environment and problems with primary support group Axis V: 21-30 behavior considerably influenced by delusions or hallucinations OR serious impairment in judgment, communication OR inability to function in almost all areas  Past Medical History:  Past Medical History  Diagnosis Date  . Hypertension   . Asthma   . Pancreatitis   . Cocaine abuse     No past surgical history on file.  Family History: No family history on file.  Social History:  reports that he has been smoking.  He does not have any smokeless tobacco history on file. He reports that he drinks alcohol. He reports that he uses illicit drugs (Marijuana).  Additional Social History:    Allergies:  Allergies  Allergen Reactions  . Shellfish-Derived Products Nausea And Vomiting    Home Medications:  (Not in a hospital admission)  OB/GYN Status:  No LMP for male patient.  General Assessment Data Location of Assessment: [facility]  Assessment Services Living Arrangements: Non-relatives/Friends Can pt return to current living arrangement?: Yes Admission Status: Voluntary Is patient capable of signing voluntary admission?: Yes Transfer from: Home Referral Source: Self/Family/Friend     Risk to self Suicidal Ideation: No Suicidal Intent: No Is patient at risk for suicide?: No Suicidal Plan?: No Access to Means: No What has been your use of drugs/alcohol within the last 12 months?: PT ADMTIS DRINK ETOH ON ADAILY BASES TO HELP DEAL WTH STRESSOR & TENDS TO DRINK 2 OR MORE 40OZ BEERS & LAS DRINK WAS LAST NIGHT. PT ADMITS TO ALSO SMOKING THC & SMOKES SEVERAL BLUNTS DAILY & LAST BLUNT WAS LAST NIGHT.[name] Previous Attempts/Gestures: No How many times?: 0  Other Self Harm Risks: NA Triggers for Past Attempts: Other personal contacts;Unpredictable Intentional Self Injurious Behavior: None Family Suicide History: No Recent stressful life event(s): Conflict (Comment);Job Loss;Financial Problems;Turmoil (Comment) Persecutory voices/beliefs?: No Depression: Yes Depression Symptoms: Loss of interest in usual pleasures;Feeling worthless/self pity Substance abuse history and/or treatment for substance abuse?: No Suicide prevention information given to non-admitted patients: Not applicable  Risk to Others Homicidal Ideation: No Thoughts of Harm to Others: No Current Homicidal Intent: No Current Homicidal Plan: No Access to Homicidal Means: No Identified Victim: NA History of harm to others?: No Assessment of Violence: None Noted Violent Behavior Description: CALM, DEPRESSED, EMOTIONAL & TEARFUL  Does patient have access to weapons?: No Criminal Charges Pending?: No Does  patient have a court date: No  Psychosis Hallucinations: None noted Delusions: None noted  Mental Status Report Appear/Hygiene: Disheveled Eye Contact: Fair Motor Activity: Freedom of movement Speech: Logical/coherent;Slow Level of Consciousness:  Alert Mood: Depressed;Sad;Anhedonia;Ashamed/humiliated;Despair Affect: Depressed;Sad;Appropriate to circumstance;Angry;Anxious Anxiety Level: None Thought Processes: Coherent;Relevant Judgement: Unimpaired Orientation: Person;Place;Time;Situation Obsessive Compulsive Thoughts/Behaviors: None  Cognitive Functioning Concentration: Decreased Memory: Recent Intact;Remote Intact IQ: Average Insight: Poor Impulse Control: Poor Appetite: Poor Weight Loss: 0  Weight Gain: 0  Sleep: No Change Total Hours of Sleep: 2  Vegetative Symptoms: None  Prior Inpatient Therapy Prior Inpatient Therapy: Yes Prior Therapy Dates: 1991. 1995 Prior Therapy Facilty/Provider(s): [facility], [facility] Reason for Treatment: STABILIZATION  Prior Outpatient Therapy Prior Outpatient Therapy: No Prior Therapy Dates: NA Prior Therapy Facilty/Provider(s): NA Reason for Treatment: NA                     Additional Information 1:1 In Past 12 Months?: No CIRT Risk: No Elopement Risk: No Does patient have medical clearance?: No     Disposition:  Disposition Disposition of Patient: Inpatient treatment program;Referred to (DR. READLING & Lynann Bologna, NP HAS ACCEPTED PT) Type of inpatient treatment program: Adult  On Site Evaluation by:   Reviewed with Physician:     Waldron Session 09/28/2011 1:51 PM

## 2011-09-28 NOTE — Progress Notes (Signed)
Patient ID: [name], male   DOB: 1971-03-30, 41 y.o.   MRN: [id number] Pt is voluntary. Pt is depressed at a 7. Pt stressors are he has two kids on the way but one of the mothers of his unborn child wants to have an abortion. Pt stated he does not want his girlfriend to get an abortion. Pt stated that he is unemployed and that makes him feel less than a man. Pt stated that last night he could not stop crying and decided he needed to come in for help. Pt states he also has a substance abuse problem as well and drinks 4 to 5 beers a day and smokes about two blunts a day. Pt seems vested in treatment and wants to detox. Pt denies SI/HI/ Pt is pleasant and cooperative. Pt also has a hx of HTN and pancreatitis.

## 2011-09-28 NOTE — Progress Notes (Signed)
[facility] MD Progress Note  09/28/2011 11:06 PM  Staff called reporting patient was complaining of abdominal pain and had one bout of vomiting. Due to previous history of alcohol abuse patient was asked to report the same.  Plan: Will transfer patient to ED for evaluation of worsening of abdominal pain.  Khalid Lacko 09/28/2011, 11:06 PM

## 2011-09-28 NOTE — Tx Team (Signed)
Initial Interdisciplinary Treatment Plan  PATIENT STRENGTHS: (choose at least two) Ability for insight Average or above average intelligence Capable of independent living Communication skills Motivation for treatment/growth Supportive family/friends  PATIENT STRESSORS: Loss of job, finicial problems, and substance abuse   PROBLEM LIST: Problem List/Patient Goals Date to be addressed Date deferred Reason deferred Estimated date of resolution  Substance Abuse 09/28/11     Depression 09/28/11                                                DISCHARGE CRITERIA:  Ability to meet basic life and health needs Improved stabilization in mood, thinking, and/or behavior Motivation to continue treatment in a less acute level of care Withdrawal symptoms are absent or subacute and managed without 24-hour nursing intervention  PRELIMINARY DISCHARGE PLAN: Attend aftercare/continuing care group Attend 12-step recovery group Outpatient therapy Participate in family therapy Return to previous living arrangement  PATIENT/FAMIILY INVOLVEMENT: This treatment plan has been presented to and reviewed with the patient, TOMAS SCHAMP, and/or family member.  The patient and family have been given the opportunity to ask questions and make suggestions.  Omelia Blackwater Violon 09/28/2011, 3:21 PM

## 2011-09-28 NOTE — ED Notes (Signed)
Pt from [facility], brought in here for severe abdominal pain--onset this morning, started as "burning" then progresses to "sharp, stabbing pain". Pt reports taking Tylenol but it did not help at all.

## 2011-09-28 NOTE — ED Notes (Signed)
Pt states he is here for abd pain that started earlier today but has gotten worse over the past hour  Pt states he has pancreatitis  Pt states he was drinking last night and early this am  Pt states he has had N/V/D today

## 2011-09-29 DIAGNOSIS — F10939 Alcohol use, unspecified with withdrawal, unspecified: Secondary | ICD-10-CM

## 2011-09-29 DIAGNOSIS — F10239 Alcohol dependence with withdrawal, unspecified: Secondary | ICD-10-CM

## 2011-09-29 DIAGNOSIS — F1020 Alcohol dependence, uncomplicated: Principal | ICD-10-CM

## 2011-09-29 DIAGNOSIS — F1210 Cannabis abuse, uncomplicated: Secondary | ICD-10-CM

## 2011-09-29 LAB — URINALYSIS, ROUTINE W REFLEX MICROSCOPIC
Glucose, UA: NEGATIVE mg/dL
Hgb urine dipstick: NEGATIVE
Leukocytes, UA: NEGATIVE
Specific Gravity, Urine: 1.018 (ref 1.005–1.030)
pH: 7.5 (ref 5.0–8.0)

## 2011-09-29 MED ORDER — BENZTROPINE MESYLATE 1 MG/ML IJ SOLN
2.0000 mg | Freq: Once | INTRAMUSCULAR | Status: AC
  Administered 2011-09-29: 2 mg via INTRAMUSCULAR
  Filled 2011-09-29 (×2): qty 2

## 2011-09-29 MED ORDER — LORAZEPAM 1 MG PO TABS
1.0000 mg | ORAL_TABLET | Freq: Four times a day (QID) | ORAL | Status: DC | PRN
  Administered 2011-09-29 – 2011-09-30 (×2): 1 mg via ORAL
  Filled 2011-09-29: qty 1

## 2011-09-29 MED ORDER — GI COCKTAIL ~~LOC~~
30.0000 mL | Freq: Once | ORAL | Status: AC
  Administered 2011-09-29: 30 mL via ORAL
  Filled 2011-09-29: qty 30

## 2011-09-29 MED ORDER — ONDANSETRON HCL 4 MG/2ML IJ SOLN
4.0000 mg | INTRAMUSCULAR | Status: DC | PRN
  Administered 2011-09-29: 4 mg via INTRAVENOUS
  Filled 2011-09-29: qty 2

## 2011-09-29 MED ORDER — DIPHENHYDRAMINE HCL 50 MG/ML IJ SOLN
50.0000 mg | Freq: Once | INTRAMUSCULAR | Status: AC
  Administered 2011-09-29: 50 mg via INTRAMUSCULAR

## 2011-09-29 MED ORDER — METHOCARBAMOL 500 MG PO TABS
500.0000 mg | ORAL_TABLET | Freq: Three times a day (TID) | ORAL | Status: DC
  Administered 2011-09-29 – 2011-10-04 (×15): 500 mg via ORAL
  Filled 2011-09-29 (×23): qty 1

## 2011-09-29 MED ORDER — DIPHENHYDRAMINE HCL 50 MG/ML IJ SOLN
INTRAMUSCULAR | Status: AC
  Administered 2011-09-29: 50 mg
  Filled 2011-09-29: qty 1

## 2011-09-29 MED ORDER — HYDROMORPHONE HCL PF 1 MG/ML IJ SOLN
1.0000 mg | Freq: Once | INTRAMUSCULAR | Status: AC
  Administered 2011-09-29: 1 mg via INTRAVENOUS
  Filled 2011-09-29: qty 1

## 2011-09-29 MED ORDER — NICOTINE 21 MG/24HR TD PT24
21.0000 mg | MEDICATED_PATCH | Freq: Every day | TRANSDERMAL | Status: DC
  Administered 2011-09-29 – 2011-10-03 (×5): 21 mg via TRANSDERMAL
  Filled 2011-09-29 (×8): qty 1

## 2011-09-29 MED ORDER — HALOPERIDOL 5 MG PO TABS
5.0000 mg | ORAL_TABLET | Freq: Four times a day (QID) | ORAL | Status: DC | PRN
Start: 2011-09-29 — End: 2011-10-03
  Administered 2011-09-29: 5 mg via ORAL

## 2011-09-29 MED ORDER — HALOPERIDOL 5 MG PO TABS
ORAL_TABLET | ORAL | Status: AC
  Filled 2011-09-29: qty 1

## 2011-09-29 MED ORDER — CHLORDIAZEPOXIDE HCL 25 MG PO CAPS
50.0000 mg | ORAL_CAPSULE | Freq: Four times a day (QID) | ORAL | Status: AC
  Administered 2011-09-29 (×2): 50 mg via ORAL
  Filled 2011-09-29: qty 1

## 2011-09-29 MED ORDER — NAPROXEN 250 MG PO TABS
250.0000 mg | ORAL_TABLET | Freq: Two times a day (BID) | ORAL | Status: DC
  Administered 2011-09-29 – 2011-10-04 (×10): 250 mg via ORAL
  Filled 2011-09-29 (×14): qty 1

## 2011-09-29 MED ORDER — CHLORDIAZEPOXIDE HCL 25 MG PO CAPS
50.0000 mg | ORAL_CAPSULE | Freq: Three times a day (TID) | ORAL | Status: DC
  Administered 2011-09-30: 50 mg via ORAL
  Filled 2011-09-29 (×2): qty 2

## 2011-09-29 MED ORDER — LORAZEPAM 1 MG PO TABS
ORAL_TABLET | ORAL | Status: AC
  Filled 2011-09-29: qty 1

## 2011-09-29 MED ORDER — DIPHENHYDRAMINE HCL 50 MG/ML IJ SOLN
50.0000 mg | Freq: Once | INTRAMUSCULAR | Status: AC
  Administered 2011-09-29: 50 mg via INTRAMUSCULAR
  Filled 2011-09-29 (×2): qty 1

## 2011-09-29 MED ORDER — CHLORDIAZEPOXIDE HCL 25 MG PO CAPS: 50.0000 mg | ORAL_CAPSULE | ORAL | Status: DC

## 2011-09-29 MED ORDER — CHLORDIAZEPOXIDE HCL 25 MG PO CAPS: 50.0000 mg | ORAL_CAPSULE | Freq: Every day | ORAL | Status: DC

## 2011-09-29 MED ORDER — DIPHENHYDRAMINE HCL 25 MG PO CAPS
ORAL_CAPSULE | ORAL | Status: AC
  Filled 2011-09-29: qty 2

## 2011-09-29 MED ORDER — METHOCARBAMOL 500 MG PO TABS
500.0000 mg | ORAL_TABLET | Freq: Three times a day (TID) | ORAL | Status: DC | PRN
  Administered 2011-09-29: 500 mg via ORAL
  Filled 2011-09-29: qty 1

## 2011-09-29 NOTE — Progress Notes (Signed)
[facility] Group Notes:  (Counselor/Nursing/MHT/Case Management/Adjunct)  09/29/2011 2:00 PM  Type of Therapy:  Group Therapy  Participation Level:  Minimal  Participation Quality:  Attentive and Sharing  Affect:  Blunted and Depressed  Cognitive:  Alert and Oriented  Insight:  Limited  Engagement in Group:  Limited  Engagement in Therapy:  Limited  Modes of Intervention:  Clarification, Orientation, Problem-solving and Support  Summary of Progress/Problems:  Therapist prompted Patients to identify activities the would like to participate in but would not be able to do so under the influence of drugs or alcohol.  Therapist opened the discussion of the importance of developing and maintaining a balanced lifestyle.  Pt actively participated by disclosing what would contribute to a balance within the Physical, Mental, Emotional, and Spiritual.  Pt was receptive to feedback..  Therapist offered support and encouragement.    Marni Griffon C 09/29/2011, 2:00 PM

## 2011-09-29 NOTE — Discharge Planning (Signed)
Wise states he is interested in getting into a long term rehab program in [place], either [facility] or [facility].  York Spaniel he would be able to get a ride to [place] from family.  Will give him phone numbers.  Also, rescheduled an appointment with [facility] that he had for tomorrow.

## 2011-09-29 NOTE — H&P (Addendum)
Psychiatric Admission Assessment Adult  (Note placed by Lynann Bologna, NP, in wrong pt chart.  Removed note from that chart and copied note into [name] documents.)  Date of Evaluation:  09/28/2011 Chief Complaint: Suicidal thoughts and depression, alcohol abuse  History of Present Illness:   Who presents complaining of passive suicidal thoughts feeling that life is not worth living. He says his depression has been increasing since he lost his job 16 months ago due to was slow down and business. He was previously working with a Technical [employer]. Now he reports that he cannot stand to be bothered by anybody, feels worthless because he's out of work. Starts crying frequently for no reason at all. And recently has developed some thought disorder, believing that people are talking to him, when they are not. Believes people are calling his name.  Denies command hallucinations. Endorses worthlessness and guilt.   Also complains of increased irritability, and left home on Sunday when his wife was angry with him, because he was afraid he would react in a bad way. Denies any homicidal thoughts. He went on an alcohol binge starting on Sunday, May 19 for 3 days and now has developed abdominal pain consistent with his previous history of pancreatitis.  He rates his depression a 7/10 on a 1-10 scale if 10 is the worst symptoms, anxiety 7/10, hopelessness 7/10. Feels life is not worth living, has no plan or intent for suicide. Feels more hopeful now that he is here in the hospital.  Mental status exam: Fully alert male, in full contact with reality, with good eye contact. Concentration is decreased, with very mild psychomotor slowing and response latency. He is able to give a coherent history with some tangentiality and rambling. He accepts redirection and is cooperative. He is asking for help with his mood and his depression. Insight is adequate, impulse control fair, judgment fair. Thoughts are slowed cognition  is otherwise intact.  Past psychiatric history: One previous admission to [facility] in [facility] more than 15 years ago for depression, with thought problems, drug and alcohol problems.  Treated at that time with Trazodone which gave him vivid dreams, and Risperdal, and ? Other unknown meds.   History of hospitalization for pancreatitis x 2 last year for up to 14 days.  Past Psychiatric History: see above  Past Medical History:   .  Depression     .  Anxiety     .  Panic attack     .  Thyroid disease     .  Drug abuse     .  Non Hodgkin's lymphoma         in remission    Allergies:  No Known Allergies  PTA Medications: .  levothyroxine (SYNTHROID, LEVOTHROID) 25 MCG tablet  Take 25 mcg by mouth daily.          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 Living with girlfriend he wants to marry, and her 2 children. Has a 9 y o and 25 y o children that do not live with him.  Supportive mother.  History of steady work.  No known legal issues.   Marital Status:  Single Education:  HS Graduate History of Abuse (Emotional/Phsycial/Sexual) Military History:  None.  Family History:  Denies family history of mental illness or substance abuse  Medical Evaluation: I have medically and physically evaluated this patient and please see the full PE  documented elsewhere in the record. Very mild abdominal tenderness, still has an appetite, no vomiting or loose stools.  Denies symptoms of GI bleeding.  Basic labs are pending.     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  Depression     .  Anxiety     .  Panic attack     .  Thyroid disease     .  Drug abuse     .  Non Hodgkin's lymphoma         in remission   AXIS IV:   unemployment AXIS V:  41-50 serious symptoms  Treatment Plan Summary: Daily contact with patient to assess and evaluate symptoms and progress in treatment Medication management; Librium detox protocol and basic labs, with lipase.  Start Carafate and pepcid bid.  Instructed to inform nurses if pain gets worse and he understands plan.  Risperdal and Celexa for depression with neurovegetative symptoms. Will send to ED if symptoms worsen.  Nurses given instructions.    Current Medications:   Current Facility-Administered Medications   Medication  Dose  Route  Frequency  Provider  Last Rate  Last Dose   .  acetaminophen (TYLENOL) tablet 650 mg   650 mg  Oral  Q6H PRN  Curlene Labrum Readling, MD         .  alum & mag hydroxide-simeth (MAALOX/MYLANTA) 200-200-20 MG/5ML suspension 30 mL   30 mL  Oral  Q4H PRN  Curlene Labrum Readling, MD         .  haloperidol (HALDOL) tablet 5 mg   5 mg  Oral  BH-qamhs  Viviann Spare, FNP     5 mg at 09/28/11 1214   .  levothyroxine (SYNTHROID, LEVOTHROID) tablet 25 mcg   25 mcg  Oral  QAC breakfast  Viviann Spare, FNP     25 mcg at 09/28/11 1214   .  LORazepam (ATIVAN) tablet 1 mg   1 mg  Oral  Q6H PRN  Viviann Spare, FNP     1 mg at 09/28/11 1215   .  magnesium hydroxide (MILK OF MAGNESIA) suspension 30 mL   30 mL  Oral  Daily PRN  Curlene Labrum Readling, MD         .  traZODone (DESYREL) tablet 100 mg   100 mg  Oral  QHS PRN  Curlene Labrum Readling, MD         .  traZODone (DESYREL) tablet 100 mg   100 mg  Oral  QHS  Viviann Spare, FNP            Facility-Administered Medications Ordered in Other Encounters   Medication  Dose  Route  Frequency  Provider  Last Rate  Last Dose   .  DISCONTD: acetaminophen (TYLENOL) tablet 650 mg   650 mg  Oral  Q4H PRN  Glynn Octave, MD         .  DISCONTD: LORazepam (ATIVAN) tablet 1 mg   1 mg  Oral  Q8H PRN  Glynn Octave, MD     1 mg at 09/27/11 1811   .  DISCONTD: nicotine (NICODERM CQ - dosed in mg/24 hours) patch 21 mg   21 mg   Transdermal  Daily  Glynn Octave, MD         .  DISCONTD: ondansetron (ZOFRAN) tablet 4 mg   4 mg  Oral  Q8H PRN  Glynn Octave, MD         .  DISCONTD: ziprasidone (GEODON) capsule 40 mg   40 mg  Oral  BID WC  Glynn Octave, MD     40 mg at 09/27/11 2004   .  DISCONTD: ziprasidone (GEODON) injection 20 mg   20 mg  Intramuscular  Q12H PRN  Glynn Octave, MD          Kari Baars A 5/22/20136:00 PM  Cosigned by: Lupe Carney, DO
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
## 2011-09-29 NOTE — Progress Notes (Signed)
[facility] Group Notes:  (Counselor/Nursing/MHT/Case Management/Adjunct)  09/29/2011 11:55 AM  Type of Therapy:  Group Therapy  Participation Level:  Minimal  Participation Quality:  Attentive and Sharing  Affect:  Blunted and Depressed  Cognitive:  Alert and Oriented  Insight:  Limited  Engagement in Group:  Limited  Engagement in Therapy:  Limited  Modes of Intervention:  Clarification, Orientation, Problem-solving and Support  Summary of Progress/Problems:  Therapist began group with a brief check-in.  Pt explained that he has been depressed over the consequences due to him getting 2 different women pregnant.  Therapist prompted Pt to verbalize one thing he would like to change about himself.  Patient actively participated by disclosing that he would like to learn how to not let others get to him. Pt was was receptive to feedback..  Therapist offered support and encouragement.    Christen Butter 09/29/2011, 11:55 AM

## 2011-09-29 NOTE — ED Provider Notes (Signed)
History     CSN: [id number]  Arrival date & time 09/28/11  2105   First MD Initiated Contact with Patient 09/28/11 2338      Chief Complaint  Patient presents with  . Abdominal Pain    (Consider location/radiation/quality/duration/timing/severity/associated sxs/prior treatment) HPI Comments: Patient with a history of hypertension, pancreatitis, cocaine abuse, and hypertension presents emergency department with a chief complaint of abdominal pain.  He states that the pain feels difficult to a flareup of his chronic pancreatitis.  Patient denies any recent alcohol use.  Pain is located in the epigastric and left upper quadrant.  Patient states that when his pancreatitis flares she also gets worse and acid reflux and complains of burping.  Associated symptoms include nausea, vomiting, and diarrhea.  Patient is currently being treated for his depression.  No other complaints at this time.  Patient is a 41 y.o. male presenting with abdominal pain. The history is provided by the patient.  Abdominal Pain The primary symptoms of the illness include abdominal pain, nausea, vomiting and diarrhea. The primary symptoms of the illness do not include fever, shortness of breath or dysuria.  Symptoms associated with the illness do not include chills, constipation, urgency or hematuria.    Past Medical History  Diagnosis Date  . Hypertension   . Asthma   . Pancreatitis   . Cocaine abuse   . Depression     Past Surgical History  Procedure Date  . Cosmetic surgery     Family History  Problem Relation Age of Onset  . Hypertension Other   . Coronary artery disease Other     History  Substance Use Topics  . Smoking status: Current Everyday Smoker -- 1.0 packs/day    Types: Cigarettes  . Smokeless tobacco: Current User  . Alcohol Use: Yes     heavily 3x/week      Review of Systems  Constitutional: Positive for appetite change. Negative for fever and chills.  HENT: Negative for neck  stiffness and dental problem.   Eyes: Negative for visual disturbance.  Respiratory: Negative for cough, chest tightness, shortness of breath and wheezing.   Cardiovascular: Negative for chest pain.  Gastrointestinal: Positive for nausea, vomiting, abdominal pain and diarrhea. Negative for constipation, blood in stool, abdominal distention, anal bleeding and rectal pain.  Genitourinary: Negative for dysuria, urgency, hematuria and flank pain.  Musculoskeletal: Negative for myalgias and arthralgias.  Skin: Negative for rash.  Neurological: Negative for dizziness, syncope, speech difficulty, numbness and headaches.  Hematological: Does not bruise/bleed easily.  All other systems reviewed and are negative.    Allergies  Trazodone and nefazodone and Shellfish-derived products  Home Medications   Current Outpatient Rx  Name Route Sig Dispense Refill  . ACETAMINOPHEN 500 MG PO TABS Oral Take 1,000 mg by mouth every 6 (six) hours as needed. For headache.    . ALBUTEROL SULFATE HFA 108 (90 BASE) MCG/ACT IN AERS Inhalation Inhale 2 puffs into the lungs every 4 (four) hours as needed. For shortness of breath.    . CLONIDINE HCL 0.2 MG PO TABS Oral Take 1 tablet (0.2 mg total) by mouth 2 (two) times daily. 60 tablet 0  . FAMOTIDINE 20 MG PO TABS Oral Take 1 tablet (20 mg total) by mouth 2 (two) times daily. 30 tablet 0  . ADULT MULTIVITAMIN W/MINERALS CH Oral Take 1 tablet by mouth daily.      BP 123/80  Pulse 74  Temp(Src) 98 F (36.7 C) (Oral)  Resp 20  Ht 5' 7.5" (1.715 m)  Wt 140 lb (63.504 kg)  BMI 21.60 kg/m2  SpO2 100%  Physical Exam  Nursing note and vitals reviewed. Constitutional: Vital signs are normal. He appears well-developed and well-nourished. No distress.  HENT:  Head: Normocephalic and atraumatic.  Mouth/Throat: Uvula is midline, oropharynx is clear and moist and mucous membranes are normal.  Eyes: Conjunctivae and EOM are normal. Pupils are equal, round, and  reactive to light.  Neck: Normal range of motion and full passive range of motion without pain. Neck supple. No spinous process tenderness and no muscular tenderness present. No rigidity. No Brudzinski's sign noted.  Cardiovascular: Normal rate and regular rhythm.   Pulmonary/Chest: Effort normal and breath sounds normal. No accessory muscle usage. Not tachypneic. No respiratory distress.  Abdominal: Soft. Normal appearance. He exhibits no distension, no ascites, no pulsatile midline mass and no mass. There is tenderness in the epigastric area and left upper quadrant. There is no rigidity, no rebound, no guarding, no CVA tenderness, no tenderness at McBurney's point and negative Murphy's sign. No hernia.         No peritoneal signs   Lymphadenopathy:    He has no cervical adenopathy.  Neurological: He is alert.  Skin: Skin is warm and dry. No rash noted. He is not diaphoretic.  Psychiatric: He has a normal mood and affect. His speech is normal and behavior is normal.    ED Course  Procedures (including critical care time)  Labs Reviewed  CBC - Abnormal; Notable for the following:    RBC 3.18 (*)    Hemoglobin 10.1 (*)    HCT 30.8 (*)    All other components within normal limits  COMPREHENSIVE METABOLIC PANEL - Abnormal; Notable for the following:    Albumin 3.2 (*)    AST 127 (*)    ALT 123 (*)    All other components within normal limits  LIPASE, BLOOD - Abnormal; Notable for the following:    Lipase 103 (*)    All other components within normal limits  ETHANOL  TSH  URINALYSIS, ROUTINE W REFLEX MICROSCOPIC   No results found.   No diagnosis found.    MDM  Pancreatitis  Patient's chronic enteritis flareup treated in the emergency department with fluids and pain medication.  He is been advised to continue to hydrate orally.  Due to patient's substance abuse history and current treatment for depression narcotics would not be prescribed to the patient.  This has been  explained the patient verbalizes understanding.  Patient is hemodynamically stable and in no acute distress prior to discharge.        Jaci Carrel, [place] 09/29/11 [phone]

## 2011-09-29 NOTE — Progress Notes (Signed)
Pt returned from the ED after treatment for severe abdominal pain/vomiting.  Pt received 2mg  Dilaudid IV, Zofran 4mg , and a GI cocktail before being transferred back to [facility].  Pt informed this Clinical research associate that he is a smoker.  Order put in for a Nicotine patch 21 mg to be applied at 0600.  Encouraged pt to lie down and rest.  No additional meds given at this time.  Safety maintained with q15 minute checks.

## 2011-09-29 NOTE — Progress Notes (Signed)
Patient states he slept well after receiving medication, appetite is improving. Patient describes his energy level as low and ability to pay attention as poor. Patient rates depression as 6/10, feelings of hopelessness as 6/10. Patient describes withdrawing symptoms as tremors and diarrhea. Patient denies SI/HI, also denies A/V hallucinations. Patient given support and encouragement. Patient describes plan to "get help, stop drinking and be more relaxed. And accept things for what they are." Patient remains on Q15 minute checks for safety. Will continue to monitor.

## 2011-09-29 NOTE — Discharge Instructions (Signed)
Acute Pancreatitis The pancreas is a large gland located behind your stomach. It produces (secretes) enzymes. These enzymes help digest food. It also releases the hormones glucagon and insulin. These hormones help regulate blood sugar. When the pancreas becomes inflamed, the disease is called pancreatitis. Inflammation of the pancreas occurs when enzymes from the pancreas begin attacking and digesting the pancreas. CAUSES  Most cases ofsudden onset (acute) pancreatitis are caused by:  Alcohol abuse.   Gallstones.  Other less common causes are:  Some medications.   Exposure to certain chemicals   Infection.   Damage caused by an accident (trauma).   Surgery of the belly (abdomen).  SYMPTOMS  Acute pancreatitis usually begins with pain in the upper abdomen and may radiate to the back. This pain may last a couple days. The constant pain varies from mild to severe. The acute form of this disease may vary from mild, nonspecific abdominal pain to profound shock with coma. About 1 in 5 cases are severe. These patients become dehydrated and develop low blood pressure. In severe cases, bleeding into the pancreas can lead to shock and death. The lungs, heart, and kidneys may fail. DIAGNOSIS  Your caregiver will form a clinical opinion after giving you an exam. Laboratory work is used to confirm this diagnosis. Often,a digestive enzyme from the pancreas (serum amylase) and other enzymes are elevated. Sugars and fats (lipids) in the blood may be elevated. There may also be changes in the following levels: calcium, magnesium, potassium, chloride and bicarbonate (chemicals in the blood). X-rays, a CT scan, or ultrasound of your abdomen may be necessary to search for other causes of your abdominal pain. TREATMENT  Most pancreatitis requires treatment of symptoms. Most acute attacks last a couple of days. Your caregiver can discuss the treatment options with you.  If complications occur, hospitalization  may be necessary for pain control and intravenous (IV) fluid replacement.   Sometimes, a tube may be put into the stomach to control vomiting.   Food may not be allowed for 3 to 4 days. This gives the pancreas time to rest. Giving the pancreas a rest means there is no stimulation that would produce more enzymes and cause more damage.   Medicines (antibiotics) that kill germs may be given if infection is the cause.   Sometimes, surgery may be required.   Following an acute attack, your caregiver will determine the cause, if possible, and offer suggestions to prevent recurrences.  HOME CARE INSTRUCTIONS   Eat smaller, more frequent meals. This reduces the amount of digestive juices the pancreas produces.   Decrease the amount of fat in your diet. This may help reduce loose, diarrheal stools.   Drink enough water and fluids to keep your urine clear or pale yellow. This is to avoid dehydration which can cause increased pain.   Talk to your caregiver about pain relievers or other medicines that may help.   Avoid anything that may have triggered your pancreatitis (for example, alcohol).   Follow the diet advised by your caregiver. Do not advance the diet too soon.   Take medicines as prescribed.   Get plenty of rest.   Check your blood sugar at home as directed by your caregiver.   If your caregiver has given you a follow-up appointment, it is very important to keep that appointment. Not keeping the appointment could result in a lasting (chronic) or permanent injury, pain, and disability. If there is any problem keeping the appointment, you must call to reschedule.    SEEK MEDICAL CARE IF:   You are not recovering in the time described by your caregiver.   You have persistent pain, weakness, or feel sick to your stomach (nauseous).   You have recovered and then have another bout of pain.  SEEK IMMEDIATE MEDICAL CARE IF:   You are unable to eat or keep fluids down.   Your pain  increases a lot or changes.   You have an oral temperature above 102 F (38.9 C), not controlled by medicine.   Your skin or the white part of your eyes look yellow (jaundice).   You develop vomiting.   You feel dizzy or faint.   Your blood sugar is high (over 300).  MAKE SURE YOU:   Understand these instructions.   Will watch your condition.   Will get help right away if you are not doing well or get worse.  Document Released: 04/25/2005 Document Revised: 04/14/2011 Document Reviewed: 12/07/2007 Exit[employer] Patient Information 2012 [employer]. 
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
## 2011-09-29 NOTE — Progress Notes (Signed)
Patient ID: [name], male   DOB: 1971/03/28, 41 y.o.   MRN: [id number]  The nurse on the hall requested that I evaluate the patient for a possible dystonic reaction after he had already been given Benedryl IM 100mg  and Cogentin IM.  He was resting comfortably in a chair at the time I entered into the room.  The patient started twitching and jerking in a bizarre and unusual manner.  Just from the waist up, jerkig his head to the right.  His speech was clear and he had no irregular movements of his jaw or mouth.  When asked to rest on the bed , he said it would make it worse.  Which it appeared to do.  However upon distraction he was able to answer all of my questions.  He was also able to safely walk to the dining hall without any problems whatsoever.  The spasms seem to stop with reassurance.  He is reassured that his symptoms will resolve since he got the medication and we will continue to watch him.  Rona Ravens. Dashawn Bartnick PAC

## 2011-09-29 NOTE — ED Provider Notes (Signed)
Medical screening examination/treatment/procedure(s) were performed by non-physician practitioner and as supervising physician I was immediately available for consultation/collaboration.  Sunnie Nielsen, MD 09/29/11 [phone]

## 2011-09-30 DIAGNOSIS — K859 Acute pancreatitis without necrosis or infection, unspecified: Secondary | ICD-10-CM

## 2011-09-30 DIAGNOSIS — F1010 Alcohol abuse, uncomplicated: Secondary | ICD-10-CM

## 2011-09-30 LAB — COMPREHENSIVE METABOLIC PANEL
Albumin: 3.3 g/dL — ABNORMAL LOW (ref 3.5–5.2)
BUN: 12 mg/dL (ref 6–23)
Calcium: 9.2 mg/dL (ref 8.4–10.5)
Creatinine, Ser: 0.77 mg/dL (ref 0.50–1.35)
Total Protein: 6.7 g/dL (ref 6.0–8.3)

## 2011-09-30 LAB — LIPASE, BLOOD: Lipase: 96 U/L — ABNORMAL HIGH (ref 11–59)

## 2011-09-30 MED ORDER — ENSURE COMPLETE PO LIQD
237.0000 mL | Freq: Two times a day (BID) | ORAL | Status: DC
  Administered 2011-09-30 – 2011-10-02 (×5): 237 mL via ORAL

## 2011-09-30 MED ORDER — LORAZEPAM 1 MG PO TABS
1.0000 mg | ORAL_TABLET | Freq: Every day | ORAL | Status: AC
  Administered 2011-10-02: 1 mg via ORAL
  Filled 2011-09-30: qty 1

## 2011-09-30 MED ORDER — LORAZEPAM 1 MG PO TABS
1.0000 mg | ORAL_TABLET | Freq: Two times a day (BID) | ORAL | Status: AC
  Administered 2011-10-01 (×2): 1 mg via ORAL
  Filled 2011-09-30 (×3): qty 1

## 2011-09-30 MED ORDER — LORAZEPAM 1 MG PO TABS
1.0000 mg | ORAL_TABLET | Freq: Three times a day (TID) | ORAL | Status: AC
  Administered 2011-09-30 (×2): 1 mg via ORAL
  Filled 2011-09-30: qty 1

## 2011-09-30 NOTE — Progress Notes (Signed)
Pt has given permission for [facility] to talk to his disability case manager ONLY.  Creig Hines, Disability Case Manager, [employer], [place] [address].

## 2011-09-30 NOTE — Progress Notes (Signed)
[facility] Group Notes:  (Counselor/Nursing/MHT/Case Management/Adjunct)  09/30/2011 11:00 AM  Type of Therapy:  Group Therapy  Participation Level:  Minimal  Participation Quality:  Attentive and Sharing  Affect:  Blunted   Cognitive:   Oriented  Insight:  Limited  Engagement in Group:  Limited  Engagement in Therapy:  Limited  Modes of Intervention:  Clarification, Orientation, Problem-solving and Support  Summary of Progress/Problems:  Therapist prompted Patients to identify barriers to recovery.  Patients engaged in a heated discussion which affirmed that until a person makes decides on their own and usually due to severe consequences of their addiction, not treatment will keep a person from using AOD.  Patient began using some negative language,  that offended another patient.  Therapist explained that being respectful was a recovery attitude and asked that the objectionable language be stopped. Therapist encouraged patients to avoid conflict while in treatment as emotions are raw.  Pt expressed feelings and gave feedback from personal experience.  Therapist offered support and encouragement.    Marni Griffon C 09/30/2011, 12:00 PM

## 2011-09-30 NOTE — Progress Notes (Signed)
[facility] Adult Inpatient Family/Significant Other Suicide Prevention Education  Suicide Prevention Education:  Patient Refusal for Family/Significant Other Suicide Prevention Education: The patient [name] has refused to provide written consent for family/significant other to be provided Family/Significant Other Suicide Prevention Education during admission and/or prior to discharge.  Physician notified.  Christen Butter 09/30/2011, 8:47 AM

## 2011-09-30 NOTE — Progress Notes (Signed)
Patient irritable upon my assessment. Paient refuses to complete Patient Self Inventory today. Patient verbally aggressive with other patients, support and encouragement given. Patient verbalizes understanding of new orders and unit policies.Patient ordered Do not admit, to minimize his interaction with fellow patients. Patient denies SI/HI, denies A/V hallucinations. Patient denies symptoms of withdrawal. Patient states "I am tired." Patient encouraged to rest in his room, resting quietly at this time. Patient remains on Q15 minute checks for safety. Will continue to monitor.
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
## 2011-09-30 NOTE — Progress Notes (Signed)
[facility] Group Notes:  (Counselor/Nursing/MHT/Case Management/Adjunct)  09/30/2011 1:00 PM  Type of Therapy:  Psych/Edu Group Therapy  Participation Level:  None  Pt reported he was experiencing hallucinations.  He could not hold his eyes open and was leaning forward.  To avoid a possible fall, Therapist asked a Tech to help assist Pt to his room.  Marni Griffon C 09/30/2011, 2:00 PM

## 2011-09-30 NOTE — Progress Notes (Signed)
Patient ID: [name], male   DOB: July 05, 1970, 41 y.o.   MRN: [id number] Otelia Santee presents appearing slightly sedated today, and disheveled, dressed in a hospital gown. Fully ambulatory and does not appear in any distress or pain. But he is in full contact with reality, and cooperative. He says he got agitated yesterday because he was having muscle twitches.   There was apparently some concern I have had an acute dystonic reaction yesterday to Risperdal.He is cooperative today, and rates his pain as 7/10 on a 1-10 scale if 10 is the worst symptoms. He is attending meals in the cafeteria and keeping food down. Was started on Naprosyn for his chronic pancreatitis.  Mental status exam: Appears mildly sedated fully oriented to person place and situation. He displays no acute symptoms of withdrawal today. No dangerous ideas. Speech is nonpressured. Mood is neutral. He does not appear psychotic or internally distracted. Thoughts and speech are normally organized. Insight is limited, impulse control adequate, judgment fair to poor.  Dx Testing: 09/28/2011  Lipase 103  Plan: Repeat Cmet and Lipase Will D/C Librium and change to Ativan.  Vital Signs are normal.

## 2011-09-30 NOTE — Progress Notes (Signed)
Pt is still expressing bizarre dystonic behaviors. Pt was given 5mg  of Haldol and 1mg  of Ativan by the [facility] due to his agitation while this writer was in report. On call Dr notified of pt continuing bizarre  behavior and in addition his abdominal pain. Pt reports tenderness. Pt denies any pain upon palpation or nausea and vomiting. Pt last Bm was around 1330 Thursday.  Pt described stool as being slightly loose. Dr ordered to continue current regimen of having haldol and ativan as needed as well Mylanta for stomach pain due to pt elevated liver enzyme levels no tylenol. Pt did not take writer up on her offer for Mylanta. Pt did take ativan around 0230 tonight. Pt is currently resting in bed. Writer will continue to monitor this pt. Pt safety remains with q73min checks.

## 2011-09-30 NOTE — Treatment Plan (Signed)
Interdisciplinary Treatment Plan Update (Adult)  Date: 09/30/2011  Time Reviewed: 8:22 AM   Progress in Treatment: Attending groups: Yes Participating in groups: Yes Taking medication as prescribed: Yes Tolerating medication: Yes   Family/Significant other contact made:  No Patient understands diagnosis:  Yes  As evidenced by asking for help with substance abuse and mood issues Discussing patient identified problems/goals with staff:  Yes See below Medical problems stabilized or resolved:  Yes Denies suicidal/homicidal ideation: Yes In tx team Issues/concerns per patient self-inventory:  Not filled out Other:  New problem(s) identified: N/A  Reason for Continuation of Hospitalization: Medication stabilization Withdrawal symptoms  Interventions implemented related to continuation of hospitalization: Ativan taper  Encourage group attendance and participation  Additional comments:  Estimated length of stay:2-3 days  Discharge Plan: unknown  New goal(s): N/A  Review of initial/current patient goals per problem list:   1.  Goal(s):Safely detox off of alcohol  Met:  No  Target date:5/26  As evidenced MV:HQIONG vitals, CIWA score of 0  2.  Goal (s): Identify comprehensive sobriety plan  Met:  No  Target date:5/26  As evidenced by: So far [facility] has indicated an interest in treatment in [place], but has made no calls to follow up  3.  Goal(s):  Met:  No  Target date:  As evidenced by:  4.  Goal(s):  Met:  No  Target date:  As evidenced by:  Attendees:  Family:  Kirin Pastorino 09/30/2011 8:22 AM  Physician:  Lupe Carney 09/30/2011 8:22 AM   Nursing:  Berneice Heinrich  09/30/2011 8:22 AM   Case Manager:  Richelle Ito, LCSW 09/30/2011 8:22 AM   Counselor:  Marni Griffon 09/30/2011 8:22 AM   Other:     Other:     Other:     Other:      Scribe for Treatment Team:   Ida Rogue, 09/30/2011 8:22 AM

## 2011-09-30 NOTE — Progress Notes (Deleted)
[facility] Group Notes:  (Counselor/Nursing/MHT/Case Management/Adjunct)  09/30/2011 1:00 AM  Type of Therapy:  Psycho/Edu Group Therapy  Participation Level:  Minimal  Participation Quality:  None  Pt complained of hallucinating and was unable to keep his eyes open and was leaning forward in the chair.  To prevent Pt from falling, Therapist asked the tech to assist Pt to his room.   Marni Griffon C 09/30/2011, 12:00 PM

## 2011-10-01 NOTE — Progress Notes (Signed)
  JAEGAR CROFT is a 41 y.o. male [id number] 04-13-1971  09/28/2011 Principal Problem:  *ALCOHOL ABUSE   Mental Status: Mood is calmer today denies SI/HI/AVH    Subjective/Objective: Abdominal discomfort is less today .Hand tremor is mild. Detoxing without unusual symptoms.  Filed Vitals:   10/01/11 0619  BP: 121/83  Pulse: 124  Temp:   Resp:     Lab Results:   BMET    Component Value Date/Time   NA 138 09/30/2011 1915   K 4.1 09/30/2011 1915   CL 100 09/30/2011 1915   CO2 28 09/30/2011 1915   GLUCOSE 99 09/30/2011 1915   BUN 12 09/30/2011 1915   CREATININE 0.77 09/30/2011 1915   CALCIUM 9.2 09/30/2011 1915   GFRNONAA >90 09/30/2011 1915   GFRAA >90 09/30/2011 1915    Medications:  Scheduled:     . citalopram  10 mg Oral Daily  . cloNIDine  0.2 mg Oral BID  . famotidine  20 mg Oral BID  . feeding supplement  237 mL Oral BID BM  . LORazepam  1 mg Oral TID  . LORazepam  1 mg Oral BID  . LORazepam  1 mg Oral Daily  . methocarbamol  500 mg Oral TID  . mulitivitamin with minerals  1 tablet Oral Daily  . naproxen  250 mg Oral BID WC  . nicotine  21 mg Transdermal Q0600  . sucralfate  1 g Oral TID WC & HS  . thiamine  100 mg Oral Daily     PRN Meds albuterol, alum & mag hydroxide-simeth, haloperidol, loperamide, magnesium hydroxide, ondansetron  Plan: continue current plan of care.   Irbin Fines,[name] 10/01/2011

## 2011-10-01 NOTE — Progress Notes (Signed)
Patient ID: [name], male   DOB: 1971-03-27, 41 y.o.   MRN: [id number] Pt. attended and participated in aftercare planning group. Pt. accepted information on suicide prevention, warning signs to look for with suicide and crisis line numbers to use. The pt. agreed to call crisis line numbers if having warning signs or having thoughts of suicide. Pt. listed their current anxiety level as 10 and depression level as 7 on a scale of 1 to 10 with 10 being the high.  Pt. accepted and AA and NA meeting schedule.  Therapist asked patient reason for high levels of anxiety and depression level.  Pt stated, "I have a lot of personal issues going on at home with my mother's of my children and girlfriend problems".  Additionally, pt. Indicated that he would like to follow up with treatment facilities in [place], [place]. Pt stated that he had not made calls to treatment centers yet but plans to do so today.

## 2011-10-01 NOTE — Progress Notes (Signed)
Patient ID: [name], male   DOB: [birth year], 41 y.o.   MRN: [id number]  [facility] Group Notes:  (Counselor/Nursing/MHT/Case Management/Adjunct)  10/01/2011 1:15 PM  Type of Therapy:  Group Therapy, Dance/Movement Therapy   Participation Level:  Did Not Attend   [facility]

## 2011-10-01 NOTE — Progress Notes (Signed)
Patient ID: [name], male   DOB: 06/28/1970, 41 y.o.   MRN: [id number] Pt withdrawn, endorses depression, but denies SI/HI at this time. No s/s of distress noted. Pt seclusive to room for much of shift. No inappropriate behaviors noted.

## 2011-10-01 NOTE — Progress Notes (Signed)
Pt pleasant on approach, denies SI/HI/hallucinations at this time.  Interacting appropriately within milieu.  No complaints voiced.  Pt wrote a letter which is in his chart, detailing his thanks to staff for his treatment.  Positive for group this evening.  Support and encouragement offered, will continue to monitor.

## 2011-10-01 NOTE — Progress Notes (Signed)
Pt is quiet and seclusive to room this shift with no group attendance.  Rates depression at 5 and hopelessness at 6. Reports cravings and sweats r/t to withdrawal. No prn medications requested. C/O abdominal pain r/t pancreatitis and explained that scheduled pain medications were the only current options. Pt was satisfied with expanation.  Compliant with with med protocols. Support offered and 15' checks cont for safety.

## 2011-10-02 MED ORDER — IBUPROFEN 200 MG PO TABS
400.0000 mg | ORAL_TABLET | Freq: Four times a day (QID) | ORAL | Status: DC | PRN
  Administered 2011-10-03 (×2): 400 mg via ORAL
  Filled 2011-10-02 (×2): qty 2

## 2011-10-02 MED ORDER — HYDROXYZINE HCL 50 MG PO TABS
50.0000 mg | ORAL_TABLET | Freq: Every evening | ORAL | Status: DC | PRN
  Administered 2011-10-02: 50 mg via ORAL

## 2011-10-02 NOTE — Progress Notes (Signed)
Patient ID: ANIK WESCH, male   DOB: May 24, 1970, 41 y.o.   MRN: [id number]   [facility] Group Notes:  (Counselor/Nursing/MHT/Case Management/Adjunct)  10/02/2011 1:15 PM  Type of Therapy:  Group Therapy, Dance/Movement Therapy   Participation Level:  Active  Participation Quality:  Appropriate and Attentive  Affect:  Appropriate  Cognitive:  Appropriate  Insight:  Good  Engagement in Group:  Good  Engagement in Therapy:  Good  Modes of Intervention:  Clarification, Problem-solving, Role-play, Socialization and Support  Summary of Progress/Problems: Therapist discussed letting go of addiction and adapting new and effective coping skills. Therapist invited group members to share negative things they would be leaving behind once they were discharged and modeled activity and drawing a hand on a sheet of paper and writ ng a healthy support or coping skill on each finger. Pt. shared that he would like to leave behind his anger and frustration once he is discharged. During the group, pt. moved closer toward another male pt. in an attempt to comfort her but needed to be redirected due to inappropriateness.     Gevena Mart

## 2011-10-02 NOTE — Progress Notes (Signed)
  [name] is a 41 y.o. male [id number] August 01, 1970  09/28/2011 Principal Problem:  *ALCOHOL ABUSE   Mental Status: Mood is hopeful denies SI/HI/AVH     Subjective/Objective:  Plans to go to a 6 month rehab at [facility] in [place].  Filed Vitals:   10/02/11 0701  BP: 136/81  Pulse: 125  Temp:   Resp:     Lab Results:   BMET    Component Value Date/Time   NA 138 09/30/2011 1915   K 4.1 09/30/2011 1915   CL 100 09/30/2011 1915   CO2 28 09/30/2011 1915   GLUCOSE 99 09/30/2011 1915   BUN 12 09/30/2011 1915   CREATININE 0.77 09/30/2011 1915   CALCIUM 9.2 09/30/2011 1915   GFRNONAA >90 09/30/2011 1915   GFRAA >90 09/30/2011 1915    Medications:  Scheduled:     . citalopram  10 mg Oral Daily  . cloNIDine  0.2 mg Oral BID  . famotidine  20 mg Oral BID  . feeding supplement  237 mL Oral BID BM  . LORazepam  1 mg Oral BID  . LORazepam  1 mg Oral Daily  . methocarbamol  500 mg Oral TID  . mulitivitamin with minerals  1 tablet Oral Daily  . naproxen  250 mg Oral BID WC  . nicotine  21 mg Transdermal Q0600  . sucralfate  1 g Oral TID WC & HS  . thiamine  100 mg Oral Daily     PRN Meds albuterol, alum & mag hydroxide-simeth, haloperidol, magnesium hydroxide  Plan: hopes to go to [facility] -understands it may be Tuesday due to the holiday.  [name],[name]. 10/02/2011

## 2011-10-02 NOTE — Progress Notes (Signed)
Pt is out in dayroom interacting with peers and attending groups.  Reports poor sleep,good appetite and normal energy level.  Rates depression at 6and hopelessness at 7.  Denies SI.  No physical complaints and no requests for prn medications.  Proactive in his aftercare plans and praise and encouragement given. Cont current POC and 15' checks for safety.

## 2011-10-02 NOTE — Progress Notes (Signed)
Pt approached desk demanding d/c and was quite persistant stating "I can sign out AMA." Verbal redirection given and after lengthy 1:1, pt agreed to settle and behavior was appropriate. Cont to give support and encouragement.  15' checks cont for safety.

## 2011-10-02 NOTE — Progress Notes (Signed)
Patient ID: [name], male   DOB: [birth year], 41 y.o.   MRN: [id number] Pt. attended and participated in aftercare planning group. Pt. verbally accepted information on suicide prevention, warning signs to look for with suicide and crisis line numbers to use. The pt. agreed to call crisis line numbers if having warning signs or having thoughts of suicide. Pt. listed their current anxiety level as 6 and depression level as 7.  Pt. indicated that he was going to call the [facility] in [place].  Pt. Stated that he needed a fax number in order he may receive an application or materials needed for enrollment.  Case manager gave pt. the fax number to the case mgmt. office.

## 2011-10-02 NOTE — Progress Notes (Signed)
Pt appropriate on approach, denies complaints at this time.  Positive for evening group, denies SI/HI/hallucinations.  Interacting appropriately within milieu.  Support and encouragement offered, will continue to monitor.

## 2011-10-03 LAB — CBC
MCH: 32.4 pg (ref 26.0–34.0)
MCV: 101.6 fL — ABNORMAL HIGH (ref 78.0–100.0)
Platelets: 210 10*3/uL (ref 150–400)
RDW: 15.3 % (ref 11.5–15.5)
WBC: 9.7 10*3/uL (ref 4.0–10.5)

## 2011-10-03 LAB — COMPREHENSIVE METABOLIC PANEL
AST: 91 U/L — ABNORMAL HIGH (ref 0–37)
Albumin: 3.6 g/dL (ref 3.5–5.2)
Calcium: 9.4 mg/dL (ref 8.4–10.5)
Creatinine, Ser: 0.86 mg/dL (ref 0.50–1.35)
Total Protein: 7.1 g/dL (ref 6.0–8.3)

## 2011-10-03 LAB — CARDIAC PANEL(CRET KIN+CKTOT+MB+TROPI): Total CK: 292 U/L — ABNORMAL HIGH (ref 7–232)

## 2011-10-03 MED ORDER — NICOTINE POLACRILEX 2 MG MT GUM: 2.0000 mg | CHEWING_GUM | OROMUCOSAL | Status: DC | PRN

## 2011-10-03 MED ORDER — MENTHOL 3 MG MT LOZG: 1.0000 | LOZENGE | OROMUCOSAL | Status: DC | PRN

## 2011-10-03 MED ORDER — TUBERCULIN PPD 5 UNIT/0.1ML ID SOLN
5.0000 [IU] | Freq: Once | INTRADERMAL | Status: AC
  Administered 2011-10-03: 5 [IU] via INTRADERMAL

## 2011-10-03 NOTE — Discharge Planning (Signed)
[name] states he called [facility] representative in [place] this AM and they are not doing screenings today.  Will call back tomorrow.  Also interested in [facility] program.  Will give application today.

## 2011-10-03 NOTE — Progress Notes (Signed)
Pt has been up for most groups today.  He rated her depression 5 hopelessness and denied any anxiety on his self-inventory.  He denied any S/H ideation or A/V hallucinations.  He was found by staff to be diaphoretic and md ordered many labs this evening to include HIV test which he did give his consent and it is located in the consent tab in shadow chart.  He had a PPD placed as ordered in his right forearm.  He has an order that pt is not to exercise and to check his vs q 4 hours.  He did request prn ibuprofen at 1203 for a headache which he later reported "it helped and is gone now".  He is wanting to [facility] [place] or possibly go to [facility] cm gave pt the application today.

## 2011-10-03 NOTE — Progress Notes (Signed)
Pt has been visible on the unit and attended evening group.   Pt now in his room getting ready for bed.  Pt has scheduled Carafate 1GM which was given.  He is asking for sleep medications which were discontinued today.  Explained to pt that the med was no longer available.  Pt voices understanding.  He reports he still has a headache for which he is given Advil 400mg .  Encouraged pt to speak to his MD in the AM if he felt he needed additional med for sleep.  He voiced no other concerns.  Safety maintained with q15 minute checks.

## 2011-10-03 NOTE — Progress Notes (Signed)
[facility] MD Progress Note  10/03/2011 3:37 PM  S/O: Patient seen and evaluated. Chart reviewed. Patient stated that his mood was "ok". His affect was mood congruent, yet anxious. He denied any current thoughts of self injurious behavior, suicidal ideation or homicidal ideation. He denied any significant depressive signs or symptoms at this time. There were no auditory or visual hallucinations, paranoia, delusional thought processes, or mania noted.  Thought process was linear and goal directed.  No psychomotor agitation or retardation was noted. His speech was normal rate, tone and volume. Eye contact was good. Judgment and insight are fair.  Patient has been up and engaged on the unit.  No safety concerns reported from team.  Nevertheless, pt was extremely diaphoretic and has been tachycardic.  Sleep:  Number of Hours: 5.5    Vital Signs:Blood pressure 125/82, pulse 103, temperature 97.4 F (36.3 C), temperature source Oral, resp. rate 20, height 5' 7.5" (1.715 m), weight 63.504 kg (140 lb), SpO2 99.00%.  Current Medications:     . cloNIDine  0.2 mg Oral BID  . famotidine  20 mg Oral BID  . feeding supplement  237 mL Oral BID BM  . methocarbamol  500 mg Oral TID  . mulitivitamin with minerals  1 tablet Oral Daily  . naproxen  250 mg Oral BID WC  . sucralfate  1 g Oral TID WC & HS  . thiamine  100 mg Oral Daily  . tuberculin  5 Units Intradermal Once  . DISCONTD: citalopram  10 mg Oral Daily  . DISCONTD: nicotine  21 mg Transdermal Q0600    Lab Results: No results found for this or any previous visit (from the past 48 hour(s)).  Physical Findings: CIWA:  CIWA-Ar Total: 0   A/P: Pt diaphoretic and tachycardic.  No report of CP/SOB/N/V/D/HA.  Pt stated that this has been going on for a few days and he also had some noted lymphadenopathy noted in posterior cervical chain.  HIV/Hepatitis/TB screening with f/u CBC/CMP/Cardiac enzymes. Discontinued Celexa per pt request. Nicotine gum for  smoking cessation. Lozengers for c/o sore throat. EKG for eval diaphoresis and tacycardia. Vs q4hrs. No exercise. Will call internal medicine consult in am s/p review of findings.  Discussed with team.  Pt agreeable with plan.  [name] 10/03/2011, 3:37 PM

## 2011-10-04 LAB — HIV ANTIBODY (ROUTINE TESTING W REFLEX): HIV: NONREACTIVE

## 2011-10-04 MED ORDER — ADULT MULTIVITAMIN W/MINERALS CH: 1.0000 | ORAL_TABLET | Freq: Every day | ORAL | Status: DC

## 2011-10-04 MED ORDER — CLONIDINE HCL 0.2 MG PO TABS: 0.2000 mg | ORAL_TABLET | Freq: Two times a day (BID) | ORAL | Status: DC

## 2011-10-04 MED ORDER — FAMOTIDINE 20 MG PO TABS: 20.0000 mg | ORAL_TABLET | Freq: Two times a day (BID) | ORAL | Status: DC

## 2011-10-04 MED ORDER — FAMOTIDINE 20 MG PO TABS
20.0000 mg | ORAL_TABLET | Freq: Two times a day (BID) | ORAL | Status: DC
Start: 2011-10-04 — End: 2011-10-29
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
## 2011-10-04 NOTE — Progress Notes (Signed)
[facility] Case Management Discharge Plan:  Will you be returning to the same living situation after discharge: Yes,  home At discharge, do you have transportation home?:Yes,  family Do you have the ability to pay for your medications:Yes,  [facility]  Interagency Information:     Release of information consent forms completed and in the chart;  Patient's signature needed at discharge.  Patient to Follow up at:  Follow-up Information    Follow up with [facility] on 11/16/2011. (2:00 PM)       Follow up with [facility] on 10/06/2011. (Thursday at 8 AM sharp for your screening)    Contact information:   [address]  [place]  [phone]         Patient denies SI/HI:   Yes,  yes    Safety Planning and Suicide Prevention discussed:  Yes,  yes  Barrier to discharge identified:No.  Summary and Recommendations:   [name] 10/04/2011, 11:47 AM
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
## 2011-10-04 NOTE — Treatment Plan (Signed)
Interdisciplinary Treatment Plan Update (Adult)  Date: 10/04/2011  Time Reviewed: 12:29 PM   Progress in Treatment: Attending groups: Yes Participating in groups: Yes Taking medication as prescribed: Yes Tolerating medication: Yes   Family/Significant othe contact made:   Patient understands diagnosis:  Yes Discussing patient identified problems/goals with staff:  Yes Medical problems stabilized or resolved:  Yes Denies suicidal/homicidal ideation: Yes In tx team Issues/concerns per patient self-inventory:  None noted Other:  New problem(s) identified: N/A  Reason for Continuation of Hospitalization: Other; describe D/C today  Interventions implemented related to continuation of hospitalization:   Additional comments:  Estimated length of stay:  Discharge Plan:  Return home  Go to disability appointment tomorrow  Go to [facility] on Thursday  New goal(s): N/A  Review of initial/current patient goals per problem list:   1.  Goal(s):Safely detox from alcohol  Met:  Yes  Target date:5/25  As evidenced by:Stable vitals, CIWA of 0  2.  Goal (s):Identify comprehensive sobriety plan  Met:  Yes  Target date:5/28  As evidenced ZO:XWRUEAV has a screening at [facility] rehab scheduled for 5/30  3.  Goal(s):  Met:  Yes  Target date:  As evidenced by:  4.  Goal(s):  Met:  Yes  Target date:  As evidenced by:  Attendees: Patient:  [name] 10/04/2011 12:29 PM  Family:     Physician:  Lupe Carney 10/04/2011 12:29 PM   Nursing:  Roswell Miners  10/04/2011 12:29 PM   Case Manager:  Richelle Ito, LCSW 10/04/2011 12:29 PM   Counselor:  Ronda Fairly, LCSWA 10/04/2011 12:29 PM   Other:     Other:     Other:     Other:      Scribe for Treatment Team:   Ida Rogue, 10/04/2011 12:29 PM

## 2011-10-05 LAB — HEPATITIS PANEL, ACUTE
HCV Ab: NEGATIVE
Hep A IgM: NEGATIVE

## 2011-10-06 NOTE — Progress Notes (Signed)
Patient Discharge Instructions:  After Visit Summary (AVS):   Faxed to:  10/05/2011 Psychiatric Admission Assessment Note:   Faxed to:  10/05/2011 Suicide Risk Assessment - Discharge Assessment:   Faxed to:  10/05/2011 Faxed/Sent to the Next Level Care provider:  10/05/2011  Faxed to [facility] @ [phone]  Wandra Scot, 10/06/2011, 5:35 PM

## 2011-10-10 ENCOUNTER — Emergency Department (HOSPITAL_BASED_OUTPATIENT_CLINIC_OR_DEPARTMENT_OTHER)
Admission: EM | Admit: 2011-10-10 | Discharge: 2011-10-10 | Disposition: A | Discharge status: Home / Self Care | Payer: Self-pay | Attending: Emergency Medicine | Admitting: Emergency Medicine

## 2011-10-10 ENCOUNTER — Encounter (HOSPITAL_BASED_OUTPATIENT_CLINIC_OR_DEPARTMENT_OTHER): Payer: Self-pay | Admitting: *Deleted

## 2011-10-10 DIAGNOSIS — R109 Unspecified abdominal pain: Secondary | ICD-10-CM

## 2011-10-10 DIAGNOSIS — F329 Major depressive disorder, single episode, unspecified: Secondary | ICD-10-CM | POA: Insufficient documentation

## 2011-10-10 DIAGNOSIS — K219 Gastro-esophageal reflux disease without esophagitis: Secondary | ICD-10-CM

## 2011-10-10 DIAGNOSIS — I10 Essential (primary) hypertension: Secondary | ICD-10-CM | POA: Insufficient documentation

## 2011-10-10 DIAGNOSIS — J45909 Unspecified asthma, uncomplicated: Secondary | ICD-10-CM | POA: Insufficient documentation

## 2011-10-10 DIAGNOSIS — F3289 Other specified depressive episodes: Secondary | ICD-10-CM | POA: Insufficient documentation

## 2011-10-10 DIAGNOSIS — Z79899 Other long term (current) drug therapy: Secondary | ICD-10-CM | POA: Insufficient documentation

## 2011-10-10 LAB — COMPREHENSIVE METABOLIC PANEL WITH GFR
ALT: 66 U/L — ABNORMAL HIGH (ref 0–53)
Albumin: 3.9 g/dL (ref 3.5–5.2)
Alkaline Phosphatase: 75 U/L (ref 39–117)
Chloride: 103 meq/L (ref 96–112)
Glucose, Bld: 91 mg/dL (ref 70–99)
Potassium: 4 meq/L (ref 3.5–5.1)
Sodium: 139 meq/L (ref 135–145)
Total Bilirubin: 0.3 mg/dL (ref 0.3–1.2)
Total Protein: 7.5 g/dL (ref 6.0–8.3)

## 2011-10-10 LAB — URINALYSIS, ROUTINE W REFLEX MICROSCOPIC
Bilirubin Urine: NEGATIVE
Glucose, UA: NEGATIVE mg/dL
Hgb urine dipstick: NEGATIVE
Ketones, ur: NEGATIVE mg/dL
Leukocytes, UA: NEGATIVE
Nitrite: NEGATIVE
Protein, ur: NEGATIVE mg/dL
Specific Gravity, Urine: 1.008 (ref 1.005–1.030)
Urobilinogen, UA: 0.2 mg/dL (ref 0.0–1.0)
pH: 5.5 (ref 5.0–8.0)

## 2011-10-10 LAB — DIFFERENTIAL
Basophils Absolute: 0 10*3/uL (ref 0.0–0.1)
Basophils Relative: 0 % (ref 0–1)
Eosinophils Absolute: 0.2 K/uL (ref 0.0–0.7)
Eosinophils Relative: 2 % (ref 0–5)
Lymphocytes Relative: 28 % (ref 12–46)
Lymphs Abs: 3.2 K/uL (ref 0.7–4.0)
Monocytes Absolute: 1.3 10*3/uL — ABNORMAL HIGH (ref 0.1–1.0)
Monocytes Relative: 11 % (ref 3–12)
Neutro Abs: 6.7 K/uL (ref 1.7–7.7)
Neutrophils Relative %: 59 % (ref 43–77)

## 2011-10-10 LAB — COMPREHENSIVE METABOLIC PANEL
AST: 37 U/L (ref 0–37)
BUN: 11 mg/dL (ref 6–23)
CO2: 24 mEq/L (ref 19–32)
Calcium: 9.5 mg/dL (ref 8.4–10.5)
Creatinine, Ser: 0.7 mg/dL (ref 0.50–1.35)
GFR calc Af Amer: >90 mL/min (ref >90)
GFR calc non Af Amer: >90 mL/min (ref >90)

## 2011-10-10 LAB — CBC
HCT: 32.4 % — ABNORMAL LOW (ref 39.0–52.0)
Hemoglobin: 11.1 g/dL — ABNORMAL LOW (ref 13.0–17.0)
MCH: 33 pg (ref 26.0–34.0)
MCHC: 34.3 g/dL (ref 30.0–36.0)
MCV: 96.4 fL (ref 78.0–100.0)
Platelets: 243 K/uL (ref 150–400)
RBC: 3.36 MIL/uL — ABNORMAL LOW (ref 4.22–5.81)
RDW: 14.6 % (ref 11.5–15.5)
WBC: 11.5 K/uL — ABNORMAL HIGH (ref 4.0–10.5)

## 2011-10-10 LAB — LIPASE, BLOOD: Lipase: 52 U/L (ref 11–59)

## 2011-10-10 MED ORDER — PANTOPRAZOLE SODIUM 40 MG IV SOLR
40.0000 mg | Freq: Once | INTRAVENOUS | Status: AC
  Administered 2011-10-10: 40 mg via INTRAVENOUS
  Filled 2011-10-10: qty 40

## 2011-10-10 MED ORDER — HYDROMORPHONE HCL PF 1 MG/ML IJ SOLN
1.0000 mg | Freq: Once | INTRAMUSCULAR | Status: AC
  Administered 2011-10-10: 1 mg via INTRAVENOUS
  Filled 2011-10-10: qty 1

## 2011-10-10 MED ORDER — ONDANSETRON 8 MG PO TBDP: 8.0000 mg | ORAL_TABLET | Freq: Two times a day (BID) | ORAL | Status: AC | PRN

## 2011-10-10 MED ORDER — ONDANSETRON HCL 4 MG/2ML IJ SOLN
4.0000 mg | Freq: Once | INTRAMUSCULAR | Status: AC
  Administered 2011-10-10: 4 mg via INTRAVENOUS
  Filled 2011-10-10: qty 2

## 2011-10-10 MED ORDER — FENTANYL CITRATE 0.05 MG/ML IJ SOLN
INTRAMUSCULAR | Status: AC
  Filled 2011-10-10: qty 2

## 2011-10-10 MED ORDER — FENTANYL CITRATE 0.05 MG/ML IJ SOLN
100.0000 ug | Freq: Once | INTRAMUSCULAR | Status: AC
  Administered 2011-10-10: 100 ug via INTRAVENOUS

## 2011-10-10 MED ORDER — SODIUM CHLORIDE 0.9 % IV SOLN
Freq: Once | INTRAVENOUS | Status: AC
  Administered 2011-10-10: 03:00:00 via INTRAVENOUS

## 2011-10-10 MED ORDER — TRAMADOL HCL 50 MG PO TABS: 50.0000 mg | ORAL_TABLET | Freq: Four times a day (QID) | ORAL | Status: DC | PRN

## 2011-10-10 MED ORDER — GI COCKTAIL ~~LOC~~
30.0000 mL | Freq: Once | ORAL | Status: AC
  Administered 2011-10-10: 30 mL via ORAL
  Filled 2011-10-10: qty 30

## 2011-10-10 MED ORDER — TRAMADOL HCL 50 MG PO TABS: 50.0000 mg | ORAL_TABLET | Freq: Four times a day (QID) | ORAL | Status: AC | PRN

## 2011-10-10 MED ORDER — PANTOPRAZOLE SODIUM 40 MG PO TBEC: 40.0000 mg | DELAYED_RELEASE_TABLET | Freq: Every day | ORAL | Status: DC

## 2011-10-10 NOTE — ED Provider Notes (Signed)
History     CSN: [id number]  Arrival date & time 10/10/11  0133   First MD Initiated Contact with Patient 10/10/11 0249      Chief Complaint  Patient presents with  . Abdominal Pain    (Consider location/radiation/quality/duration/timing/severity/associated sxs/prior treatment) HPI Comments: Pt is currently at rehab for alcoholism, was briefly admitted medically for pancreatitis due to alcohol for 1-2 days.  He rep rots abd pain did improve.  He reports since yesterday, similar kind of acid, burning pain in upper abdomen, and now more frank pain that radiates to left side and around to back.  No fevers, no chills, did vomit once this evening, no diarrhea, but has been going more frequently.  No blood in stool, no black or tarry.  Denies obv sick contact, no abd use recently, no foreign travel recently.  Pt denies prior abd surgeries.    Patient is a 41 y.o. male presenting with abdominal pain. The history is provided by the patient.  Abdominal Pain The primary symptoms of the illness include abdominal pain, nausea and vomiting. The primary symptoms of the illness do not include fever, shortness of breath, diarrhea or dysuria.  Additional symptoms associated with the illness include back pain. Symptoms associated with the illness do not include chills or constipation.    Past Medical History  Diagnosis Date  . Hypertension   . Asthma   . Pancreatitis   . Cocaine abuse   . Depression     Past Surgical History  Procedure Date  . Cosmetic surgery     Family History  Problem Relation Age of Onset  . Hypertension Other   . Coronary artery disease Other     History  Substance Use Topics  . Smoking status: Current Everyday Smoker -- 1.0 packs/day    Types: Cigarettes  . Smokeless tobacco: Current User  . Alcohol Use: Yes     heavily 3x/week      Review of Systems  Constitutional: Positive for appetite change. Negative for fever and chills.  Respiratory: Negative for  cough and shortness of breath.   Cardiovascular: Negative for chest pain.  Gastrointestinal: Positive for nausea, vomiting and abdominal pain. Negative for diarrhea, constipation and blood in stool.  Genitourinary: Negative for dysuria and flank pain.  Musculoskeletal: Positive for back pain.  All other systems reviewed and are negative.    Allergies  Trazodone and nefazodone and Shellfish-derived products  Home Medications   Current Outpatient Rx  Name Route Sig Dispense Refill  . ALBUTEROL SULFATE HFA 108 (90 BASE) MCG/ACT IN AERS Inhalation Inhale 2 puffs into the lungs every 4 (four) hours as needed. For shortness of breath.    . CLONIDINE HCL 0.2 MG PO TABS Oral Take 1 tablet (0.2 mg total) by mouth 2 (two) times daily. For blood pressure 60 tablet 1  . FAMOTIDINE 20 MG PO TABS Oral Take 1 tablet (20 mg total) by mouth 2 (two) times daily. For acid reflux 30 tablet 1  . ADULT MULTIVITAMIN W/MINERALS CH Oral Take 1 tablet by mouth daily. Vitamin supplement.    [name] ONDANSETRON 8 MG PO TBDP Oral Take 1 tablet (8 mg total) by mouth every 12 (twelve) hours as needed for nausea. 20 tablet 0  . PANTOPRAZOLE SODIUM 40 MG PO TBEC Oral Take 1 tablet (40 mg total) by mouth daily. 14 tablet 0  . TRAMADOL HCL 50 MG PO TABS Oral Take 1 tablet (50 mg total) by mouth every 6 (six) hours as needed  for pain. 15 tablet 0    BP 139/90  Pulse 89  Temp(Src) 98.4 F (36.9 C) (Oral)  Resp 16  SpO2 100%  Physical Exam  Nursing note and vitals reviewed. Constitutional: He is oriented to person, place, and time. He appears well-developed and well-nourished.  HENT:  Head: Normocephalic and atraumatic.  Eyes: Pupils are equal, round, and reactive to light. No scleral icterus.  Neck: Normal range of motion. Neck supple.  Cardiovascular: Normal rate.   Pulmonary/Chest: Effort normal. No respiratory distress. He has no wheezes.  Abdominal: Soft. He exhibits no distension. There is tenderness. There is no  rebound, no guarding and no CVA tenderness. No hernia.  Musculoskeletal:       Thoracic back: Normal.       Lumbar back: Normal.  Neurological: He is alert and oriented to person, place, and time.  Skin: Skin is warm and dry.    ED Course  Procedures (including critical care time)  Labs Reviewed  CBC - Abnormal; Notable for the following:    WBC 11.5 (*)    RBC 3.36 (*)    Hemoglobin 11.1 (*)    HCT 32.4 (*)    All other components within normal limits  DIFFERENTIAL - Abnormal; Notable for the following:    Monocytes Absolute 1.3 (*)    All other components within normal limits  COMPREHENSIVE METABOLIC PANEL - Abnormal; Notable for the following:    ALT 66 (*)    All other components within normal limits  LIPASE, BLOOD  URINALYSIS, ROUTINE W REFLEX MICROSCOPIC   No results found.   1. Abdominal pain   2. GERD (gastroesophageal reflux disease)     RA sat is 100% and normal.    4:15 AM Pt with minimal transaminitis, lipase is WNL.  I doubt sig pancreatitis since not drinking currently.  Pt does have GERD and this may be gastritis or GERD flare.  Pt feels somewhat improved after meds, no guard or rebound but still in pain.  Slight elevated WBC is non specific.    6:12 AM After another dose of analgesic, protonix and zofran, pt felt mildly improved, still some mild tenderness on exam, however able to sit up, stand, walk to bathroom without much difficulty. I do not think he needs another CT scan esp with multiple radiographs in old records already, concern for radiation exposure is high.  Pt is counseled abotu reasons to return, such as sudden worse pain, high fever.  Otherwise, will prescribe antiemtic and protnix.  Pt cannot have narcotics filled at [facility], but I'm ok trying some tramadol.  Otherwise he  Should follow up with PCP as outpt.  Gi cocktail helped a little.  MDM  I reviewed recent notes in EPIC.  Will get labs including lipase.  IVF's, IV analgesics and  antiemetics for now and will monitor.  Not abrupt pain or tearing sensation.          Gavin Pound. Oletta Lamas, MD 10/10/11 [phone]

## 2011-10-10 NOTE — ED Notes (Signed)
Pt presents to ED today with right sided ab pain that started Sat morning.  Pt is resident of [facility].

## 2011-10-12 ENCOUNTER — Encounter (HOSPITAL_BASED_OUTPATIENT_CLINIC_OR_DEPARTMENT_OTHER): Payer: Self-pay | Admitting: Family Medicine

## 2011-10-12 ENCOUNTER — Emergency Department (HOSPITAL_BASED_OUTPATIENT_CLINIC_OR_DEPARTMENT_OTHER)
Admission: EM | Admit: 2011-10-12 | Discharge: 2011-10-12 | Disposition: A | Discharge status: Home / Self Care | Payer: Self-pay | Attending: Emergency Medicine | Admitting: Emergency Medicine

## 2011-10-12 DIAGNOSIS — F17200 Nicotine dependence, unspecified, uncomplicated: Secondary | ICD-10-CM | POA: Insufficient documentation

## 2011-10-12 DIAGNOSIS — Z8719 Personal history of other diseases of the digestive system: Secondary | ICD-10-CM | POA: Insufficient documentation

## 2011-10-12 DIAGNOSIS — R109 Unspecified abdominal pain: Secondary | ICD-10-CM | POA: Insufficient documentation

## 2011-10-12 DIAGNOSIS — R110 Nausea: Secondary | ICD-10-CM | POA: Insufficient documentation

## 2011-10-12 DIAGNOSIS — R10819 Abdominal tenderness, unspecified site: Secondary | ICD-10-CM | POA: Insufficient documentation

## 2011-10-12 DIAGNOSIS — I10 Essential (primary) hypertension: Secondary | ICD-10-CM | POA: Insufficient documentation

## 2011-10-12 LAB — COMPREHENSIVE METABOLIC PANEL
AST: 31 U/L (ref 0–37)
BUN: 11 mg/dL (ref 6–23)
CO2: 28 mEq/L (ref 19–32)
Calcium: 9.6 mg/dL (ref 8.4–10.5)
Chloride: 101 mEq/L (ref 96–112)
Creatinine, Ser: 0.7 mg/dL (ref 0.50–1.35)
GFR calc Af Amer: >90 mL/min (ref >90)
GFR calc non Af Amer: >90 mL/min (ref >90)
Glucose, Bld: 103 mg/dL — ABNORMAL HIGH (ref 70–99)
Total Bilirubin: 0.4 mg/dL (ref 0.3–1.2)

## 2011-10-12 LAB — CBC
HCT: 34.7 % — ABNORMAL LOW (ref 39.0–52.0)
Hemoglobin: 12 g/dL — ABNORMAL LOW (ref 13.0–17.0)
MCV: 96.7 fL (ref 78.0–100.0)
RDW: 14.1 % (ref 11.5–15.5)
WBC: 10.4 10*3/uL (ref 4.0–10.5)

## 2011-10-12 LAB — DIFFERENTIAL
Eosinophils Relative: 2 % (ref 0–5)
Lymphocytes Relative: 18 % (ref 12–46)
Lymphs Abs: 1.8 10*3/uL (ref 0.7–4.0)
Monocytes Absolute: 1.2 10*3/uL — ABNORMAL HIGH (ref 0.1–1.0)
Monocytes Relative: 11 % (ref 3–12)
Neutro Abs: 7.1 10*3/uL (ref 1.7–7.7)

## 2011-10-12 LAB — LIPASE, BLOOD: Lipase: 42 U/L (ref 11–59)

## 2011-10-12 LAB — URINALYSIS, ROUTINE W REFLEX MICROSCOPIC
Glucose, UA: NEGATIVE mg/dL
Ketones, ur: NEGATIVE mg/dL
Leukocytes, UA: NEGATIVE
Protein, ur: NEGATIVE mg/dL
Urobilinogen, UA: 0.2 mg/dL (ref 0.0–1.0)

## 2011-10-12 MED ORDER — HYDROMORPHONE HCL PF 1 MG/ML IJ SOLN
INTRAMUSCULAR | Status: AC
  Filled 2011-10-12: qty 1

## 2011-10-12 MED ORDER — HYDROMORPHONE HCL PF 1 MG/ML IJ SOLN
1.0000 mg | Freq: Once | INTRAMUSCULAR | Status: AC
  Administered 2011-10-12: 1 mg via INTRAVENOUS

## 2011-10-12 MED ORDER — GI COCKTAIL ~~LOC~~
30.0000 mL | Freq: Once | ORAL | Status: AC
  Administered 2011-10-12: 30 mL via ORAL

## 2011-10-12 MED ORDER — ONDANSETRON HCL 4 MG/2ML IJ SOLN
4.0000 mg | Freq: Once | INTRAMUSCULAR | Status: AC
  Administered 2011-10-12: 4 mg via INTRAVENOUS
  Filled 2011-10-12: qty 2

## 2011-10-12 MED ORDER — GI COCKTAIL ~~LOC~~
ORAL | Status: AC
  Filled 2011-10-12: qty 30

## 2011-10-12 MED ORDER — HYDROMORPHONE HCL PF 1 MG/ML IJ SOLN
1.0000 mg | Freq: Once | INTRAMUSCULAR | Status: AC
  Administered 2011-10-12: 1 mg via INTRAVENOUS
  Filled 2011-10-12: qty 1

## 2011-10-12 MED ORDER — SODIUM CHLORIDE 0.9 % IV SOLN
Freq: Once | INTRAVENOUS | Status: AC
  Administered 2011-10-12: 09:00:00 via INTRAVENOUS

## 2011-10-12 NOTE — ED Notes (Signed)
MD at bedside. 

## 2011-10-12 NOTE — ED Provider Notes (Signed)
History     CSN: [id number]  Arrival date & time 10/12/11  0806   First MD Initiated Contact with Patient 10/12/11 [phone]      Chief Complaint  Patient presents with  . Abdominal Pain    (Consider location/radiation/quality/duration/timing/severity/associated sxs/prior treatment) HPI Comments: Patient has history of pancreatitis from etoh abuse.  Is currently at [facility] for substance abuse treatment.  Was seen here two days ago for the same.  Was given meds and was discharged.  He returns today complaining of continued discomfort in the epigastrium, luq.    Patient is a 41 y.o. male presenting with abdominal pain. The history is provided by the patient.  Abdominal Pain The primary symptoms of the illness include abdominal pain and nausea. The primary symptoms of the illness do not include fever, vomiting, diarrhea or dysuria. Episode onset: 3 days ago. The onset of the illness was gradual. The problem has been gradually worsening.  The patient has not had a change in bowel habit. Symptoms associated with the illness do not include chills.    Past Medical History  Diagnosis Date  . Hypertension   . Asthma   . Pancreatitis   . Cocaine abuse   . Depression     Past Surgical History  Procedure Date  . Cosmetic surgery     Family History  Problem Relation Age of Onset  . Hypertension Other   . Coronary artery disease Other     History  Substance Use Topics  . Smoking status: Current Everyday Smoker -- 1.0 packs/day    Types: Cigarettes  . Smokeless tobacco: Current User  . Alcohol Use: Yes     heavily 3x/week      Review of Systems  Constitutional: Negative for fever and chills.  Gastrointestinal: Positive for nausea and abdominal pain. Negative for vomiting and diarrhea.  Genitourinary: Negative for dysuria.  All other systems reviewed and are negative.    Allergies  Trazodone and nefazodone and Shellfish-derived products  Home Medications   Current  Outpatient Rx  Name Route Sig Dispense Refill  . ALBUTEROL SULFATE HFA 108 (90 BASE) MCG/ACT IN AERS Inhalation Inhale 2 puffs into the lungs every 4 (four) hours as needed. For shortness of breath.    . CLONIDINE HCL 0.2 MG PO TABS Oral Take 1 tablet (0.2 mg total) by mouth 2 (two) times daily. For blood pressure 60 tablet 1  . FAMOTIDINE 20 MG PO TABS Oral Take 1 tablet (20 mg total) by mouth 2 (two) times daily. For acid reflux 30 tablet 1  . ADULT MULTIVITAMIN W/MINERALS CH Oral Take 1 tablet by mouth daily. Vitamin supplement.    [name] ONDANSETRON 8 MG PO TBDP Oral Take 1 tablet (8 mg total) by mouth every 12 (twelve) hours as needed for nausea. 20 tablet 0  . PANTOPRAZOLE SODIUM 40 MG PO TBEC Oral Take 1 tablet (40 mg total) by mouth daily. 14 tablet 0  . TRAMADOL HCL 50 MG PO TABS Oral Take 1 tablet (50 mg total) by mouth every 6 (six) hours as needed for pain. 15 tablet 0    BP 144/91  Pulse 119  Temp(Src) 98.6 F (37 C) (Oral)  Resp 16  SpO2 100%  Physical Exam  Nursing note and vitals reviewed. Constitutional: He is oriented to person, place, and time. He appears well-developed and well-nourished. No distress.  HENT:  Head: Normocephalic and atraumatic.  Neck: Normal range of motion. Neck supple.  Cardiovascular: Normal rate and regular rhythm.  No murmur heard. Pulmonary/Chest: Effort normal and breath sounds normal. No respiratory distress. He has no wheezes.  Abdominal: Soft. Bowel sounds are normal. He exhibits no distension. There is tenderness. There is no rebound and no guarding.       There is ttp in the epigastrium, luq.  Musculoskeletal: Normal range of motion.  Neurological: He is alert and oriented to person, place, and time.  Skin: Skin is warm and dry. He is not diaphoretic.    ED Course  Procedures (including critical care time)   Labs Reviewed  CBC  DIFFERENTIAL  COMPREHENSIVE METABOLIC PANEL  LIPASE, BLOOD   No results found.   No diagnosis  found.    MDM  The labs including wbc and lipase are very normal.  I doubt pancreatitis although he has a history of this.  I will discharge him back to [facility], to follow up as needed.  Continue tramadol as prescribed by Dr. Oletta Lamas.        Geoffery Lyons, MD 10/12/11 1032

## 2011-10-12 NOTE — ED Notes (Signed)
Ice chips provided.

## 2011-10-12 NOTE — ED Notes (Addendum)
Pt c/o LLQ pain radiating to low back. Pt sts stomach "is burning". Pt is currently in treatment center [facility] for etoh. Pt sts he was seen here on Monday for same. Pt sts he is more stressed than normal and is "probably making pain worse".

## 2011-10-25 IMAGING — CR DG ABDOMEN ACUTE W/ 1V CHEST
3 series · 3 of 3 positions shown · non-contrast
Comparison: 03/03/2009

CLINICAL DATA: Abdominal pain

ACUTE ABDOMEN SERIES (ABDOMEN 2 VIEW & CHEST 1 VIEW)

[w chest pa]
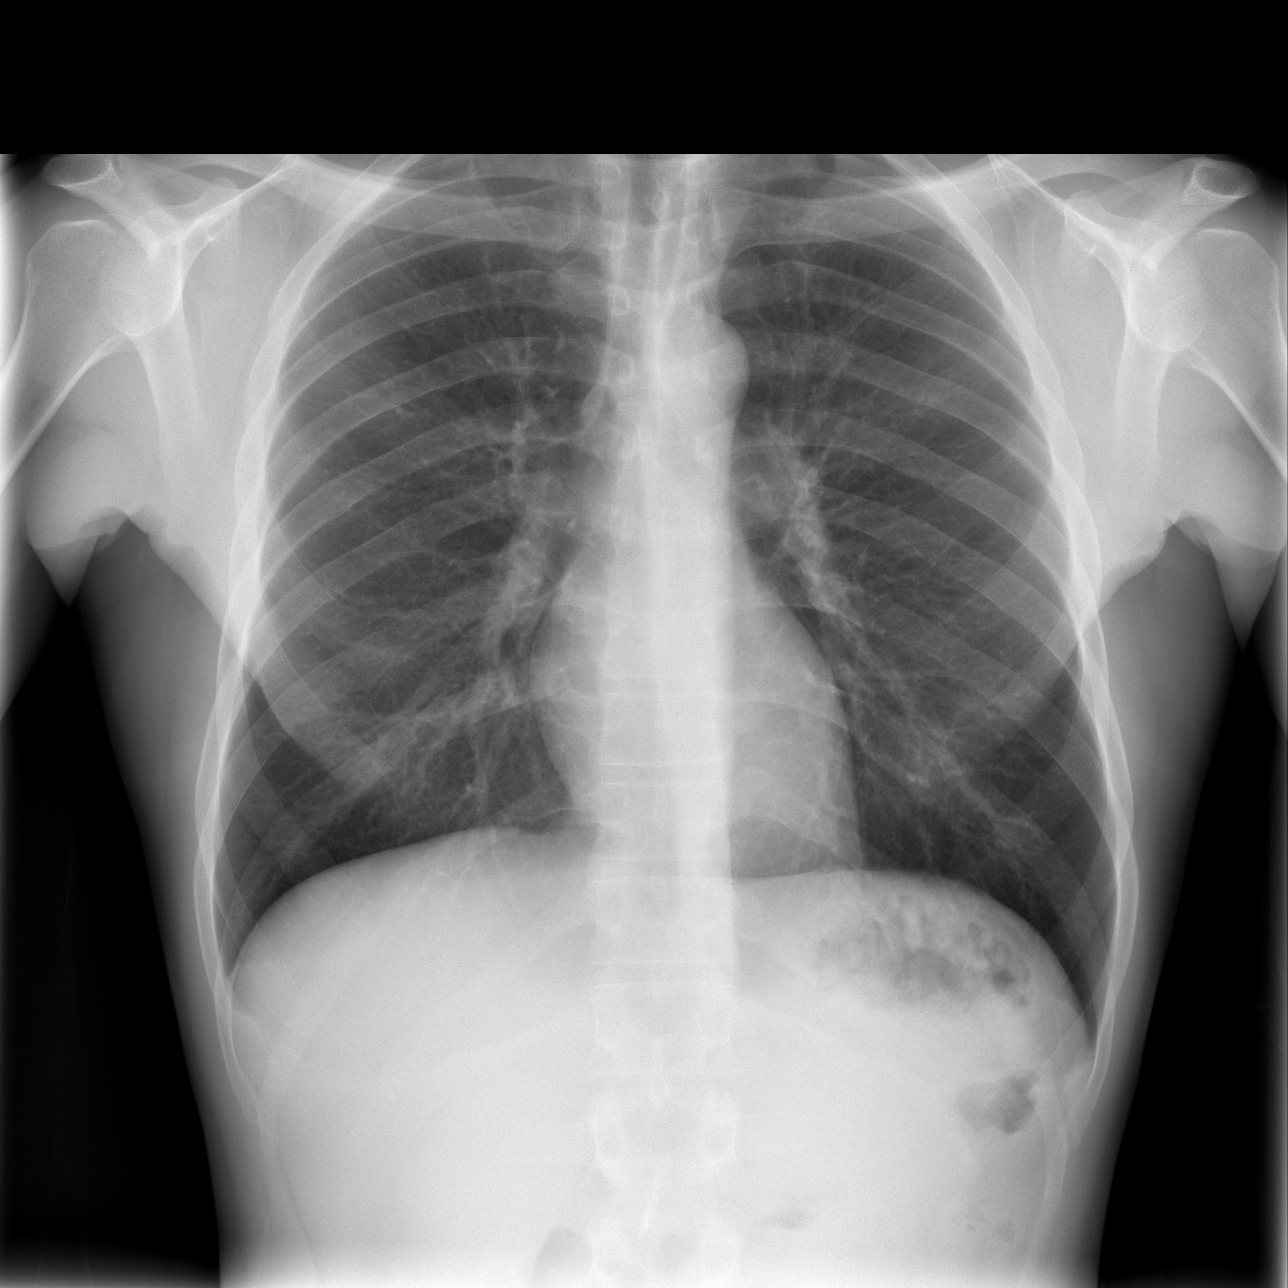

[w abdomen upright]
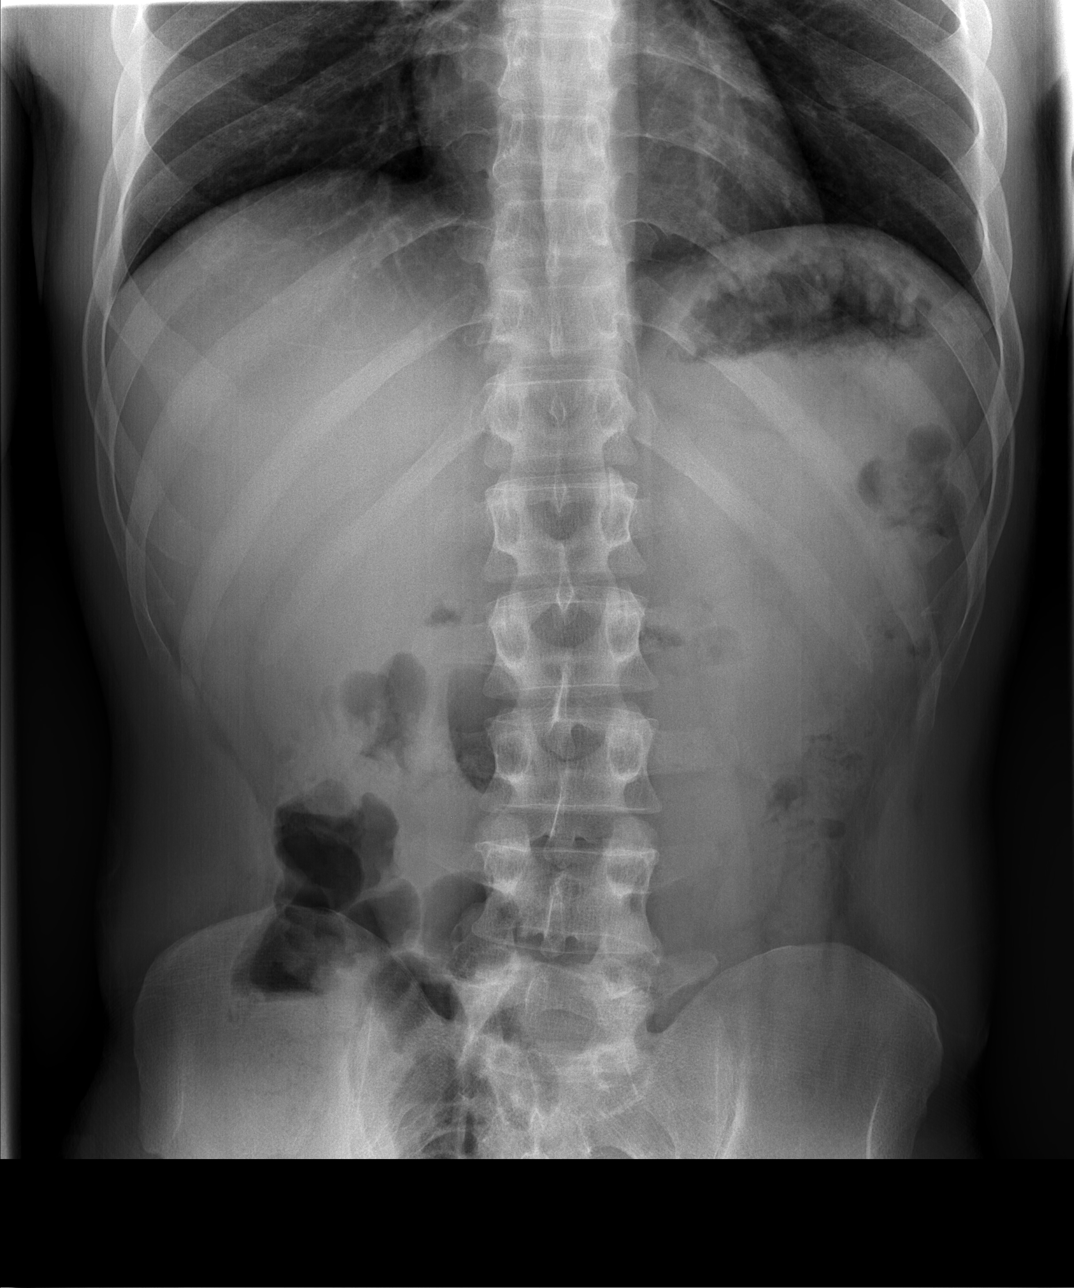

[t abdomen supine]
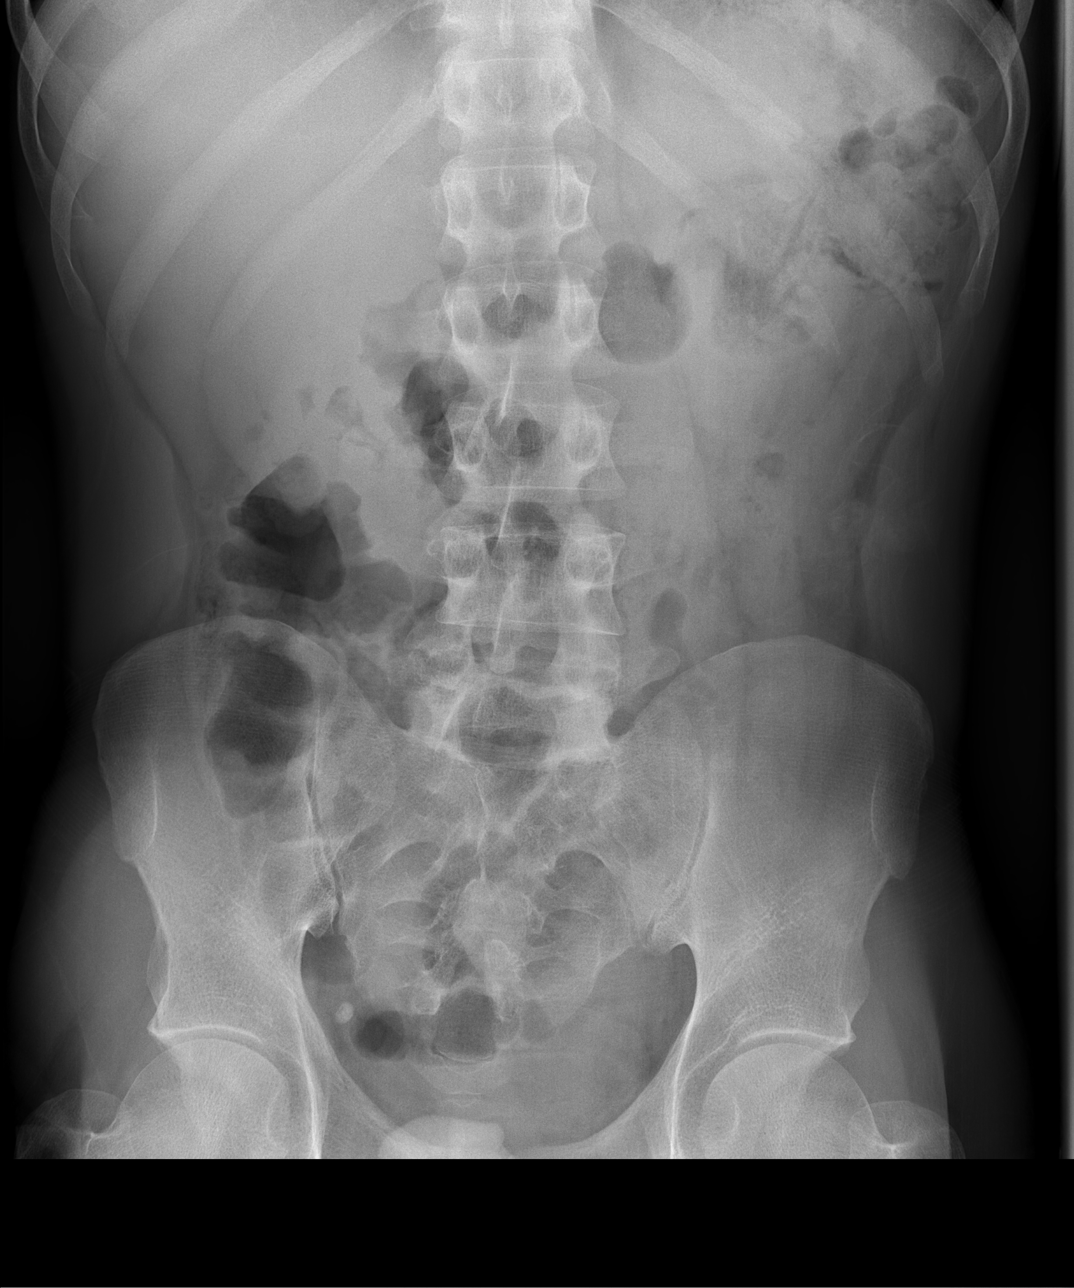

[3 of 3 positions shown; findings below may reference images not displayed]

FINDINGS: The heart and mediastinal contours are within normal
limits.  The lungs are normally expanded and clear.  No free
intraperitoneal air is identified.  The bowel gas pattern is
nonobstructive.  No radiopaque urinary tract calculus is
identified.  Right pelvic phlebolith is stable.  Osseous structures
appear within normal limits.
IMPRESSION: Negative exam.

## 2011-10-26 IMAGING — CT CT ABDOMEN W/ CM
2 of 4 series · 14 of 32 positions shown, 19 images · IV contrast (water/omni  & 100 ML OMNI 300)
Comparison: None.

CT ABDOMEN

CLINICAL DATA: Acute pancreatitis.  Abdominal pain.  Elevated
liver function tests.  Multi substance abuse.

CT ABDOMEN AND PELVIS WITH CONTRAST
TECHNIQUE: Multidetector CT imaging of the abdomen and pelvis was
performed using the standard protocol following bolus
administration of intravenous contrast.
Contrast: 100 ml Ymnipaque-XWW and oral contrast

[Series 2: routine abdomen · axial · 0.65mm/px · z∈[-394,-94]mm · 7 of 82 slices shown, 12 images]
[im 11/82  soft-tissue]
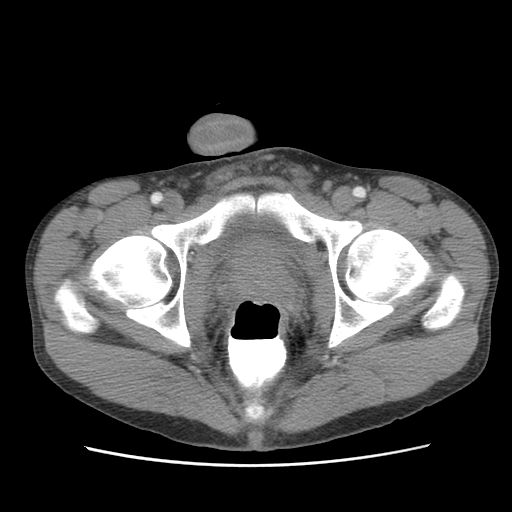
[im 11/82  bone]
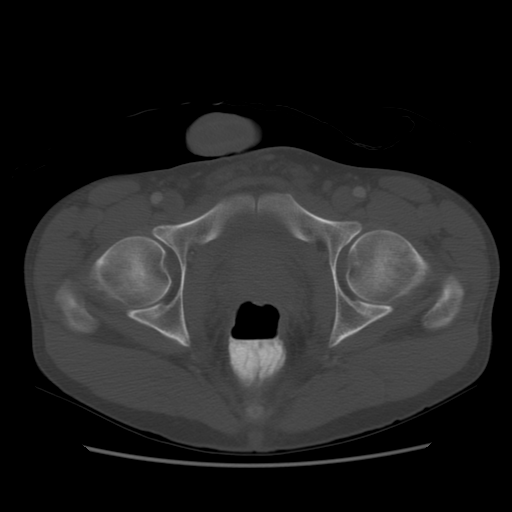
[im 21/82  soft-tissue]
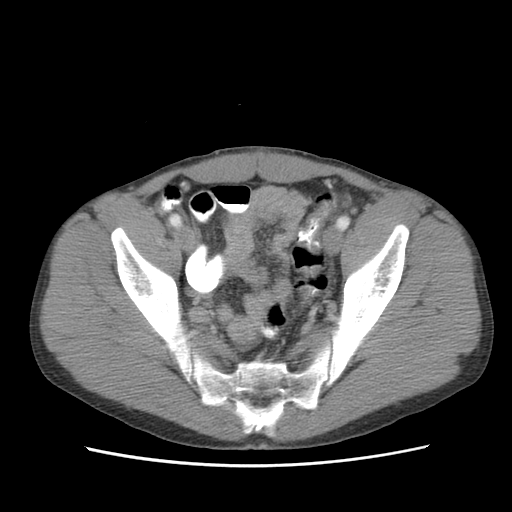
[im 31/82  soft-tissue]
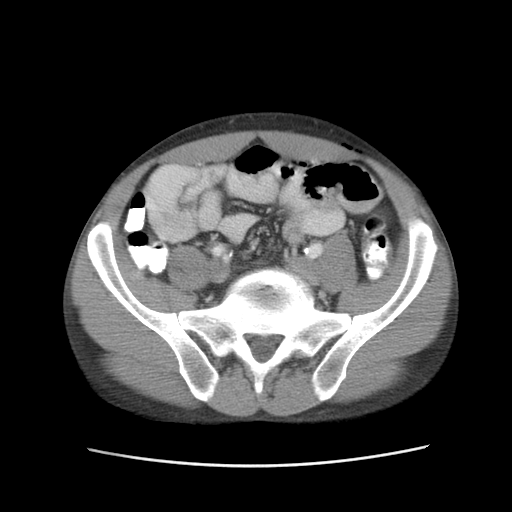
[im 41/82  soft-tissue]
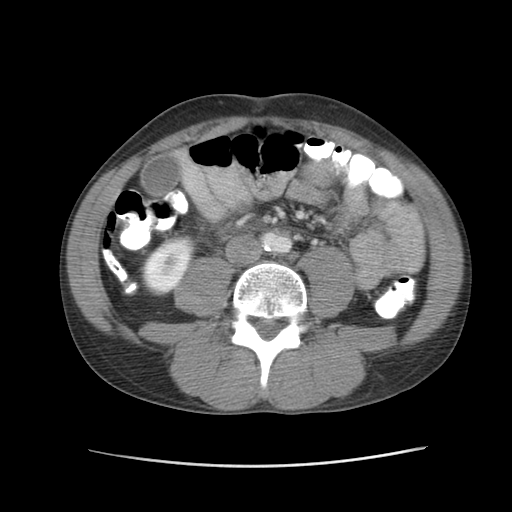
[im 41/82  lung]
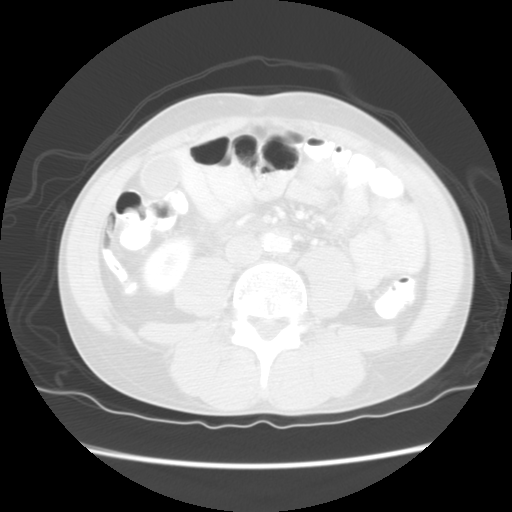
[im 51/82  soft-tissue]
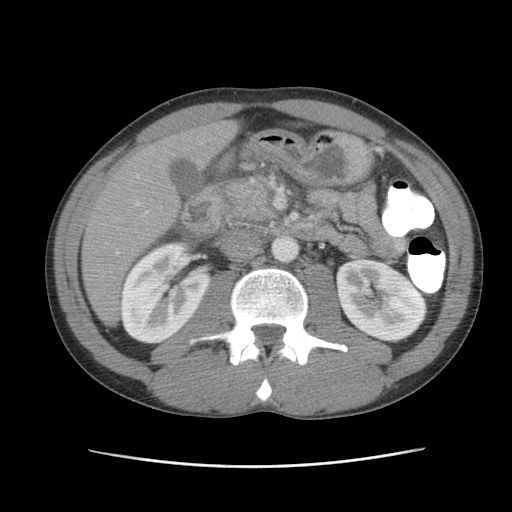
[im 51/82  lung]
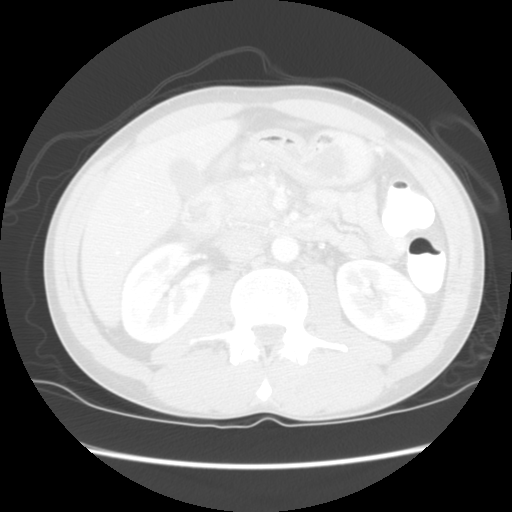
[im 61/82  soft-tissue]
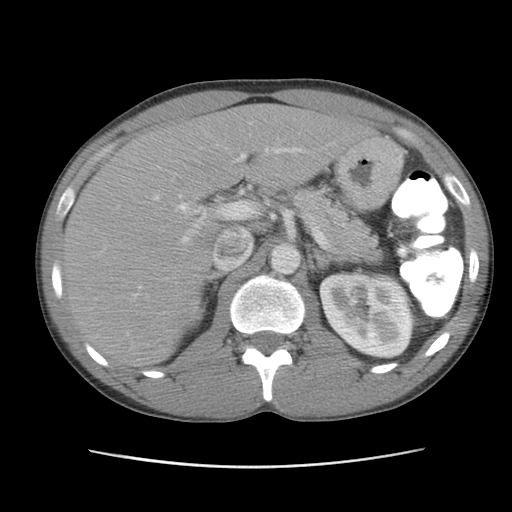
[im 61/82  lung]
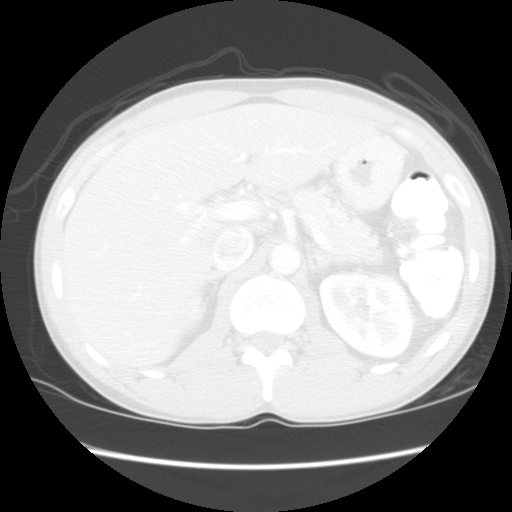
[im 71/82  soft-tissue]
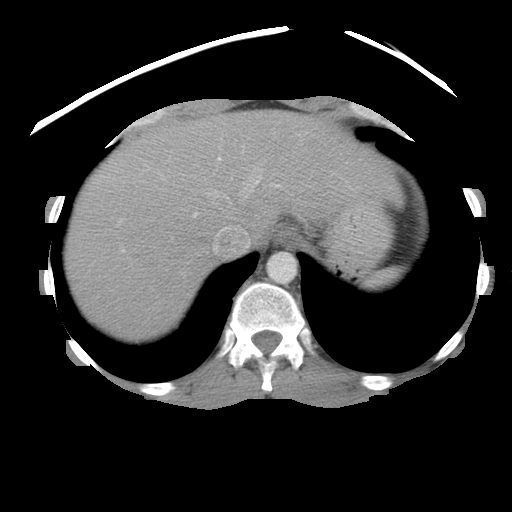
[im 71/82  lung]
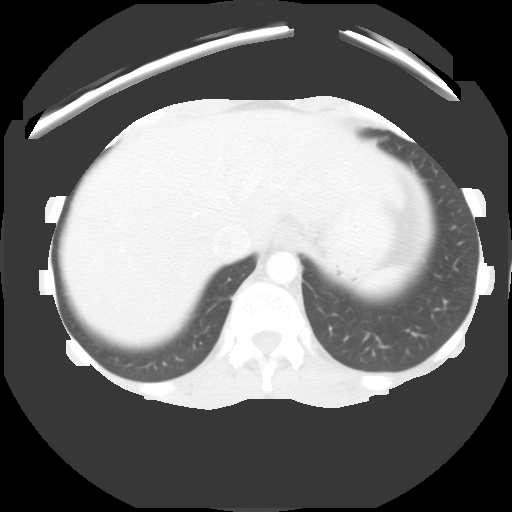

[Series 400: sag · sagittal · 0.83mm/px · 7 of 89 slices shown]
[im 9/89  soft-tissue]
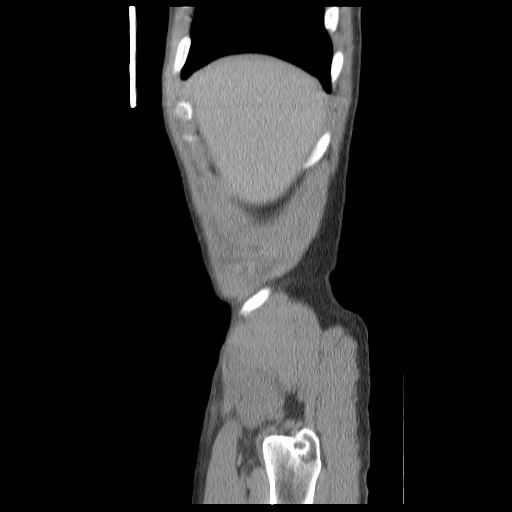
[im 18/89  soft-tissue]
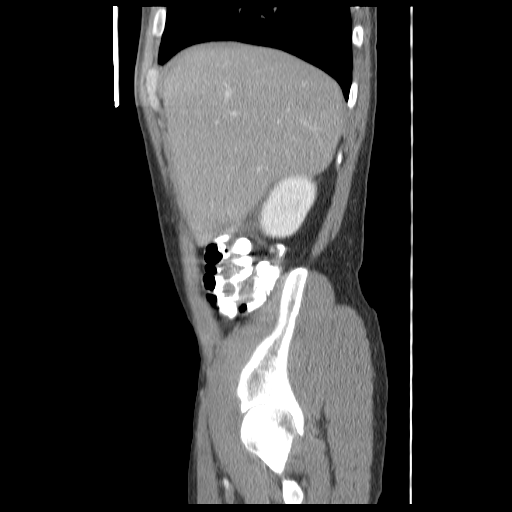
[im 27/89  soft-tissue]
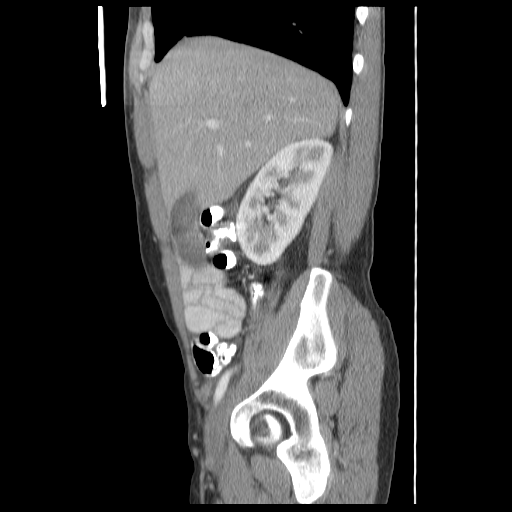
[im 36/89  soft-tissue]
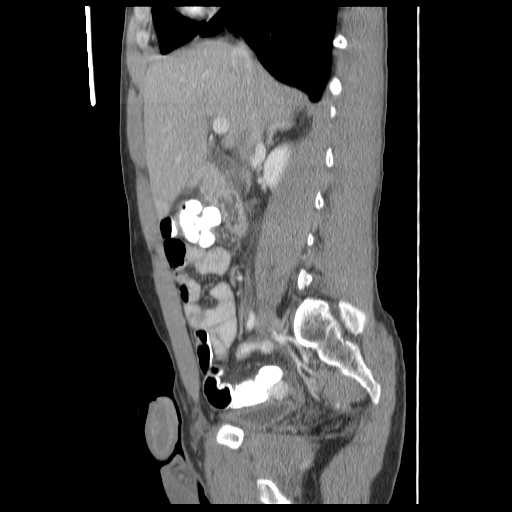
[im 53/89  soft-tissue]
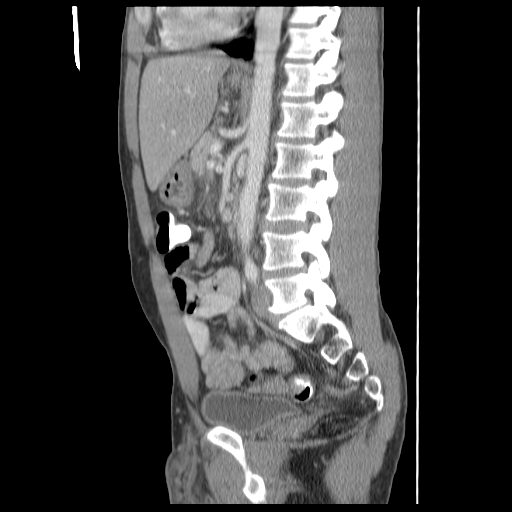
[im 62/89  soft-tissue]
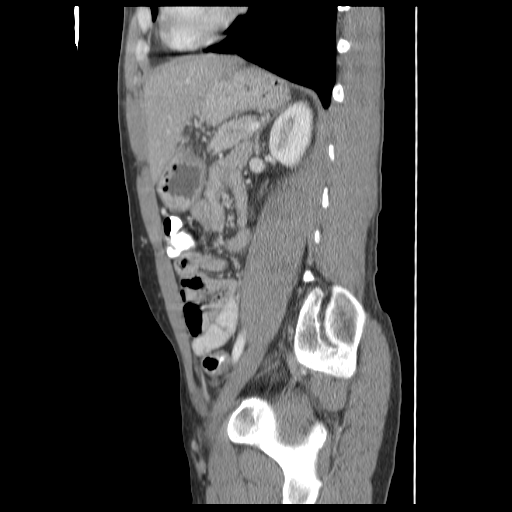
[im 71/89  soft-tissue]
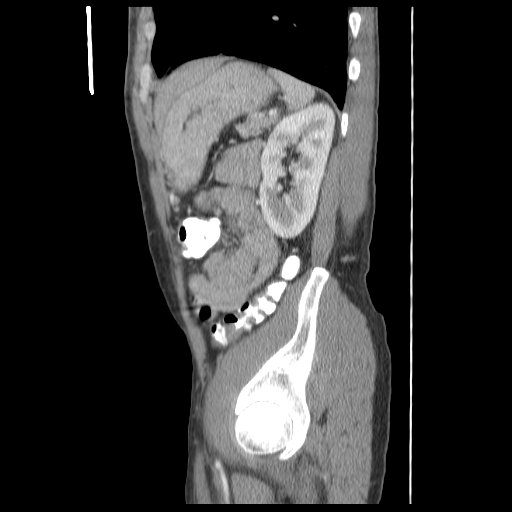

[14 of 32 positions shown; findings below may reference images not displayed]

FINDINGS: Mild swelling of the pancreatic head uncinate process is
seen with mild peripancreatic inflammatory change, consistent with
acute pancreatitis.  A tiny amount of fluid is seen in Morison's
pouch, however there is no evidence of pancreatic pseudocyst
formation.  There is no evidence of pancreatic necrosis or splenic
or portal vein thrombosis.

The liver and gallbladder are unremarkable appearance.  There is no
evidence of biliary or pancreatic ductal dilatation.  There is no
evidence of splenomegaly.  No soft tissue masses or lymphadenopathy
identified.  No evidence of dilated bowel loops.
IMPRESSION: 1.  Mild acute pancreatitis.
2.  No evidence of pancreatic pseudocyst or other complications.

CT PELVIS
FINDINGS: No pseudocysts or other abnormal fluid collections are
identified within the pelvis.  There is no evidence of pelvic soft
tissue masses or lymphadenopathy.  No inflammatory process
identified.  Pelvic bowel loops appear normal.
IMPRESSION: Negative pelvis CT.  No evidence of pseudocyst or other significant
findings.

## 2011-10-29 ENCOUNTER — Emergency Department (HOSPITAL_COMMUNITY): Payer: Self-pay

## 2011-10-29 ENCOUNTER — Emergency Department (HOSPITAL_COMMUNITY)
Admission: EM | Admit: 2011-10-29 | Discharge: 2011-10-29 | Disposition: A | Discharge status: Home / Self Care | Payer: Self-pay | Attending: Emergency Medicine | Admitting: Emergency Medicine

## 2011-10-29 ENCOUNTER — Encounter (HOSPITAL_COMMUNITY): Payer: Self-pay | Admitting: Emergency Medicine

## 2011-10-29 DIAGNOSIS — F329 Major depressive disorder, single episode, unspecified: Secondary | ICD-10-CM | POA: Insufficient documentation

## 2011-10-29 DIAGNOSIS — R51 Headache: Secondary | ICD-10-CM | POA: Insufficient documentation

## 2011-10-29 DIAGNOSIS — F17200 Nicotine dependence, unspecified, uncomplicated: Secondary | ICD-10-CM | POA: Insufficient documentation

## 2011-10-29 DIAGNOSIS — I10 Essential (primary) hypertension: Secondary | ICD-10-CM | POA: Insufficient documentation

## 2011-10-29 DIAGNOSIS — Z91013 Allergy to seafood: Secondary | ICD-10-CM | POA: Insufficient documentation

## 2011-10-29 DIAGNOSIS — S20219A Contusion of unspecified front wall of thorax, initial encounter: Secondary | ICD-10-CM | POA: Insufficient documentation

## 2011-10-29 DIAGNOSIS — J45909 Unspecified asthma, uncomplicated: Secondary | ICD-10-CM | POA: Insufficient documentation

## 2011-10-29 DIAGNOSIS — F3289 Other specified depressive episodes: Secondary | ICD-10-CM | POA: Insufficient documentation

## 2011-10-29 MED ORDER — IBUPROFEN 800 MG PO TABS: 800.0000 mg | ORAL_TABLET | Freq: Three times a day (TID) | ORAL | Status: AC | PRN

## 2011-10-29 MED ORDER — HYDROCODONE-ACETAMINOPHEN 5-325 MG PO TABS: 1.0000 | ORAL_TABLET | Freq: Four times a day (QID) | ORAL | Status: AC | PRN

## 2011-10-29 MED ORDER — KETOROLAC TROMETHAMINE 60 MG/2ML IM SOLN
60.0000 mg | Freq: Once | INTRAMUSCULAR | Status: AC
  Administered 2011-10-29: 60 mg via INTRAMUSCULAR
  Filled 2011-10-29: qty 2
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
## 2011-10-29 NOTE — ED Notes (Signed)
Patient discharged and verbalized an understanding of instructions

## 2011-10-29 NOTE — Discharge Instructions (Signed)
Return here as needed. The x-rays did not show any signs of broken ribs. Use ice and heat on your ribs

## 2011-10-29 NOTE — ED Provider Notes (Signed)
Medical screening examination/treatment/procedure(s) were performed by non-physician practitioner and as supervising physician I was immediately available for consultation/collaboration.   Carleene Cooper III, MD 10/29/11 [phone]

## 2011-10-29 NOTE — ED Notes (Signed)
Patient admits to drug and ETOH abuse.

## 2011-10-29 NOTE — ED Notes (Signed)
Resting quietly with eyes closed NAD noted at this time.

## 2011-10-29 NOTE — ED Notes (Signed)
Patient transported to X-ray 
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
## 2011-10-29 NOTE — ED Notes (Signed)
Back from xray,

## 2011-11-04 ENCOUNTER — Emergency Department (HOSPITAL_COMMUNITY)
Admission: EM | Admit: 2011-11-04 | Discharge: 2011-11-04 | Disposition: A | Discharge status: Home / Self Care | Payer: Self-pay | Attending: Emergency Medicine | Admitting: Emergency Medicine

## 2011-11-04 ENCOUNTER — Encounter (HOSPITAL_COMMUNITY): Payer: Self-pay | Admitting: *Deleted

## 2011-11-04 ENCOUNTER — Emergency Department (HOSPITAL_COMMUNITY): Payer: Self-pay

## 2011-11-04 DIAGNOSIS — R079 Chest pain, unspecified: Secondary | ICD-10-CM | POA: Insufficient documentation

## 2011-11-04 DIAGNOSIS — Z79899 Other long term (current) drug therapy: Secondary | ICD-10-CM | POA: Insufficient documentation

## 2011-11-04 DIAGNOSIS — Z888 Allergy status to other drugs, medicaments and biological substances status: Secondary | ICD-10-CM | POA: Insufficient documentation

## 2011-11-04 DIAGNOSIS — I10 Essential (primary) hypertension: Secondary | ICD-10-CM | POA: Insufficient documentation

## 2011-11-04 DIAGNOSIS — J45909 Unspecified asthma, uncomplicated: Secondary | ICD-10-CM | POA: Insufficient documentation

## 2011-11-04 DIAGNOSIS — R0781 Pleurodynia: Secondary | ICD-10-CM

## 2011-11-04 DIAGNOSIS — Z91013 Allergy to seafood: Secondary | ICD-10-CM | POA: Insufficient documentation

## 2011-11-04 DIAGNOSIS — F17200 Nicotine dependence, unspecified, uncomplicated: Secondary | ICD-10-CM | POA: Insufficient documentation

## 2011-11-04 MED ORDER — TRAMADOL HCL 50 MG PO TABS: 50.0000 mg | ORAL_TABLET | Freq: Four times a day (QID) | ORAL | Status: AC | PRN

## 2011-11-04 MED ORDER — HYDROCODONE-ACETAMINOPHEN 5-325 MG PO TABS
1.0000 | ORAL_TABLET | Freq: Once | ORAL | Status: AC
  Administered 2011-11-04: 1 via ORAL
  Filled 2011-11-04: qty 1
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
## 2011-11-04 NOTE — ED Notes (Signed)
Pt was sleep when nurse entered the room

## 2011-11-04 NOTE — Discharge Instructions (Signed)
You were seen and evaluated for your continued left rib pains.  Your x-rays today have not shown any changes in your ribs and no signs for pneumonia infections.  Please continue to rest and follow up with your primary care provider for continued evaluation and treatment.    Rib Contusion A rib contusion (bruise) can occur by a blow to the chest or by a fall against a hard object. Usually these will be much better in a couple weeks. If X-rays were taken today and there are no broken bones (fractures), the diagnosis of bruising is made. However, broken ribs may not show up for several days, or may be discovered later on a routine X-ray when signs of healing show up. If this happens to you, it does not mean that something was missed on the X-ray, but simply that it did not show up on the first X-rays. Earlier diagnosis will not usually change the treatment. HOME CARE INSTRUCTIONS   Avoid strenuous activity. Be careful during activities and avoid bumping the injured ribs. Activities that pull on the injured ribs and cause pain should be avoided, if possible.   For the first day or two, an ice pack used every 20 minutes while awake may be helpful. Put ice in a plastic bag and put a towel between the bag and the skin.   Eat a normal, well-balanced diet. Drink plenty of fluids to avoid constipation.   Take deep breaths several times a day to keep lungs free of infection. Try to cough several times a day. Splint the injured area with a pillow while coughing to ease pain. Coughing can help prevent pneumonia.   Wear a rib belt or binder only if told to do so by your caregiver. If you are wearing a rib belt or binder, you must do the breathing exercises as directed by your caregiver. If not used properly, rib belts or binders restrict breathing which can lead to pneumonia.   Only take over-the-counter or prescription medicines for pain, discomfort, or fever as directed by your caregiver.  SEEK MEDICAL CARE  IF:   You or your child has an oral temperature above 102 F (38.9 C).   Your baby is older than 3 months with a rectal temperature of 100.5 F (38.1 C) or higher for more than 1 day.   You develop a cough, with thick or bloody sputum.  SEEK IMMEDIATE MEDICAL CARE IF:   You have difficulty breathing.   You feel sick to your stomach (nausea), have vomiting or belly (abdominal) pain.   You have worsening pain, not controlled with medications, or there is a change in the location of the pain.   You develop sweating or radiation of the pain into the arms, jaw or shoulders, or become light headed or faint.   You or your child has an oral temperature above 102 F (38.9 C), not controlled by medicine.   Your or your baby is older than 3 months with a rectal temperature of 102 F (38.9 C) or higher.   Your baby is 20 months old or younger with a rectal temperature of 100.4 F (38 C) or higher.  MAKE SURE YOU:   Understand these instructions.   Will watch your condition.   Will get help right away if you are not doing well or get worse.  Document Released: 01/18/2001 Document Revised: 04/14/2011 Document Reviewed: 12/12/2007 [facility] Patient Information 2012 [place], [place].     RESOURCE GUIDE  Chronic Pain Problems: Contact  [facility]  [phone] Patients need to be referred by their primary care doctor.  Insufficient Money for Medicine: Contact United Way:  call "211" or [facility] [phone].  No Primary Care Doctor: - Call [facility]  [phone] - can help you locate a primary care doctor that  accepts your insurance, provides certain services, etc. - Physician Referral Service- [phone]  Agencies that provide inexpensive medical care: - [facility]  [phone] - [facility]  [phone] - [facility]  [phone] - Women's Clinic  [phone] - [facility]  [phone] [facility]  [phone]  Medicaid-accepting [facility] Providers: - [facility]- [address]  [phone], Mon-Fri 9am-7pm, Sat 9am-1pm - [facility]- [address] [place], [address] [place]  [phone] - [facility]- [address] [place]  [phone] [facility]- [address]  [phone] - Renaye Rakers- [address] [place], [address], [phone]  Only accepts [place] patients after they have their name  applied to their card  Self Pay (no insurance) in [place]: - Sickle Cell Patients: Dr Willey Blade, [facility]  [address] [place], [phone] - [facility]- [address] [place]  [phone]       [facility] [place]- [address]       -     [facility]- see information above (Speak to [employer] if you do not have insurance)       -  [facility]- [address] [place], [phone]       -  [facility]- [address],  [phone]       -  [facility]- [address], [phone]       -  Dr Julio Sicks-  [address], [place], [phone]       -  [facility]- [address], [phone]       -  [facility]- [address], [phone], also [address], [phone]       -    [facility]- [address] [place], [phone], 1st & 3rd Saturday   every month, 10am-1pm  1) Find a Doctor and Pay Out of Pocket Although you won't have to find out who is covered by your insurance plan, it is a good idea to ask around and get recommendations. You will then need to call the office and see if the doctor you have chosen will accept you as a new patient and what types of options they offer for patients who are self-pay. Some doctors offer discounts or will set up payment plans for their patients who do not have insurance, but you will need to ask  so you aren't surprised when you get to your appointment.  2) Contact Your Local Health Department Not all health departments have doctors that can see patients for sick visits, but many do, so it is worth a call to see if yours does. If you don't know where your local health department is, you can check in your phone book. The CDC also has a tool to help you locate your state's health department, and many state websites also have listings of all of their local  health departments.  3) Find a Walk-in Clinic If your illness is not likely to be very severe or complicated, you may want to try a walk in clinic. These are popping up all over the country in pharmacies, drugstores, and shopping centers. They're usually staffed by nurse practitioners or physician assistants that have been trained to treat common illnesses and complaints. They're usually fairly quick and inexpensive. However, if you have serious medical issues or chronic medical problems, these are probably not your best option  STD Testing - [facility] Department of [facility] [place], STD Clinic, [address] [place], phone [phone] or [phone].  Monday - Friday, call for an appointment. [facility] Department of [employer], STD Clinic, [address], [place], phone [phone] or [phone].  Monday - Friday, call for an appointment.  Abuse/Neglect: [facility] Child Abuse Hotline [phone] [facility] Child Abuse Hotline 343-062-0158 (After Hours)  Emergency Shelter:  [facility] 205-169-2618  Maternity Homes: - Room at the [facility] [phone] - [facility] ([phone]  MRSA Hotline #:   801-236-4298  [facility] [place] [facility]  [facility] [place]  United Way [facility] Dept. [address]                 [address]         [address] [place] [address]  Blondell Reveal Phone:  [phone]                                  Phone:  [phone]                   Phone:  [phone]  [facility], [phone] - [phone]       -     [facility] in [place], [address]                                  [phone], [facility] Child Abuse Hotline 361-827-1001 or (418)291-4497 (After Hours)   Behavioral Health Services  Substance Abuse Resources: - [facility] and Drug Services  319-017-7242 - [facility] 9085090604 - [facility] [phone] Floydene Flock [phone] - Residential & Outpatient Substance Abuse Program  [phone]  Psychological Services: [facility]  [phone] Services  [phone] - [facility], [phone] [place]. [address], [place], ACCESS LINE: [phone] or [phone], [employer]  Dental Assistance  If unable to pay or uninsured, contact:  [facility] or [facility]. to become qualified for the adult dental clinic.  Patients with Medicaid: [facility] [phone], [phone] [address] [phone]  If unable to pay, or uninsured, contact [facility] [phone]) [phone])  or [facility] [phone] in [place], [phone] in [place]) to become qualified for the adult dental clinic  Other Low-Cost Community Dental Services: - [facility]- [address] [place], [place], [place], [address], [phone], [address]. [phone], 2nd and 4th Thursday of the month at 6:30am.  10 clients each day by appointment, can sometimes see walk-in patients if someone does not show for an appointment. [facility]- [address] [place], [place], [address], [phone] - [facility]-  [address], [place], [place], [address], [phone] - [facility]- [phone] [facility]- [phone] [facility]- [phone]
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
## 2011-11-04 NOTE — ED Notes (Signed)
Pt. Refused to sign  E-signature pad.

## 2011-11-05 NOTE — ED Provider Notes (Signed)
Medical screening examination/treatment/procedure(s) were performed by non-physician practitioner and as supervising physician I was immediately available for consultation/collaboration.   Sakinah Rosamond E Esme Durkin, MD 11/05/11 1631 

## 2011-12-20 ENCOUNTER — Encounter (HOSPITAL_COMMUNITY): Payer: Self-pay | Admitting: Emergency Medicine

## 2011-12-20 ENCOUNTER — Emergency Department (HOSPITAL_COMMUNITY)
Admission: EM | Admit: 2011-12-20 | Discharge: 2011-12-20 | Disposition: A | Discharge status: Home Health | Payer: Self-pay | Attending: Emergency Medicine | Admitting: Emergency Medicine

## 2011-12-20 DIAGNOSIS — R109 Unspecified abdominal pain: Secondary | ICD-10-CM | POA: Insufficient documentation

## 2011-12-20 DIAGNOSIS — F329 Major depressive disorder, single episode, unspecified: Secondary | ICD-10-CM | POA: Insufficient documentation

## 2011-12-20 DIAGNOSIS — I10 Essential (primary) hypertension: Secondary | ICD-10-CM | POA: Insufficient documentation

## 2011-12-20 DIAGNOSIS — F3289 Other specified depressive episodes: Secondary | ICD-10-CM | POA: Insufficient documentation

## 2011-12-20 DIAGNOSIS — J45909 Unspecified asthma, uncomplicated: Secondary | ICD-10-CM | POA: Insufficient documentation

## 2011-12-20 DIAGNOSIS — F17200 Nicotine dependence, unspecified, uncomplicated: Secondary | ICD-10-CM | POA: Insufficient documentation

## 2011-12-20 LAB — CBC WITH DIFFERENTIAL/PLATELET
Basophils Absolute: 0 10*3/uL (ref 0.0–0.1)
Basophils Relative: 0 % (ref 0–1)
Eosinophils Relative: 2 % (ref 0–5)
HCT: 39.1 % (ref 39.0–52.0)
MCHC: 35 g/dL (ref 30.0–36.0)
Monocytes Absolute: 0.7 10*3/uL (ref 0.1–1.0)
Neutro Abs: 4 10*3/uL (ref 1.7–7.7)
Platelets: 190 10*3/uL (ref 150–400)
RDW: 13.4 % (ref 11.5–15.5)
WBC: 7.7 10*3/uL (ref 4.0–10.5)

## 2011-12-20 LAB — BASIC METABOLIC PANEL
BUN: 8 mg/dL (ref 6–23)
Creatinine, Ser: 0.71 mg/dL (ref 0.50–1.35)
GFR calc Af Amer: >90 mL/min (ref >90)
GFR calc non Af Amer: >90 mL/min (ref >90)
Potassium: 3.8 mEq/L (ref 3.5–5.1)

## 2011-12-20 LAB — POCT I-STAT TROPONIN I: Troponin i, poc: 0.02 ng/mL (ref 0.00–0.08)

## 2011-12-20 LAB — LIPASE, BLOOD: Lipase: 27 U/L (ref 11–59)

## 2011-12-20 MED ORDER — GI COCKTAIL ~~LOC~~
30.0000 mL | Freq: Once | ORAL | Status: AC
  Administered 2011-12-20: 30 mL via ORAL
  Filled 2011-12-20: qty 30

## 2011-12-20 MED ORDER — FAMOTIDINE IN NACL 20-0.9 MG/50ML-% IV SOLN
20.0000 mg | Freq: Once | INTRAVENOUS | Status: AC
  Administered 2011-12-20: 20 mg via INTRAVENOUS
  Filled 2011-12-20: qty 50

## 2011-12-20 MED ORDER — ESOMEPRAZOLE MAGNESIUM 40 MG PO CPDR: 40.0000 mg | DELAYED_RELEASE_CAPSULE | Freq: Every day | ORAL | Status: DC

## 2011-12-20 MED ORDER — ONDANSETRON HCL 4 MG/2ML IJ SOLN
4.0000 mg | Freq: Once | INTRAMUSCULAR | Status: AC
  Administered 2011-12-20: 4 mg via INTRAVENOUS
  Filled 2011-12-20: qty 2

## 2011-12-20 MED ORDER — MORPHINE SULFATE 4 MG/ML IJ SOLN
4.0000 mg | Freq: Once | INTRAMUSCULAR | Status: AC
  Administered 2011-12-20: 4 mg via INTRAVENOUS
  Filled 2011-12-20: qty 1

## 2011-12-20 NOTE — ED Notes (Signed)
Pt states that he has epigastric  Pain since eating on Sunday morning. Hx of pancreatitis. Pt says this feels exactly like his previous pancreatic exacerbation

## 2011-12-20 NOTE — ED Notes (Signed)
Pt discharged home in good condition. 

## 2011-12-20 NOTE — ED Notes (Signed)
Per EMS- pt having abd pain LUQ, hx of pancreatitis; started Saturday and getting worse, and today started to have n/v/d; 8/10 pain vss;

## 2011-12-20 NOTE — ED Notes (Signed)
Pt reports pain started Saturday and has gotten worse; pt with n/v/d; describes pain as burning in abd

## 2011-12-20 NOTE — ED Provider Notes (Addendum)
History     CSN: [id number]  Arrival date & time 12/20/11  0056   First MD Initiated Contact with Patient 12/20/11 0239      Chief Complaint  Patient presents with  . Abdominal Pain    (Consider location/radiation/quality/duration/timing/severity/associated sxs/prior treatment) Patient is a 41 y.o. male presenting with abdominal pain. The history is provided by the patient.  Abdominal Pain The primary symptoms of the illness include abdominal pain. The current episode started more than 2 days ago. The onset of the illness was gradual. The problem has been gradually worsening.  The illness is associated with a recent illness. The patient has not had a change in bowel habit. Symptoms associated with the illness do not include chills, anorexia, diaphoresis, heartburn or constipation. Associated medical issues comments: pancreatitis.    Past Medical History  Diagnosis Date  . Hypertension   . Asthma   . Pancreatitis   . Cocaine abuse   . Depression     Past Surgical History  Procedure Date  . Cosmetic surgery     Family History  Problem Relation Age of Onset  . Hypertension Other   . Coronary artery disease Other     History  Substance Use Topics  . Smoking status: Current Everyday Smoker -- 0.5 packs/day    Types: Cigarettes  . Smokeless tobacco: Current User  . Alcohol Use: Yes     occasion      Review of Systems  Constitutional: Negative for chills and diaphoresis.  Gastrointestinal: Positive for abdominal pain. Negative for heartburn, constipation and anorexia.  All other systems reviewed and are negative.    Allergies  Trazodone and nefazodone and Shellfish-derived products  Home Medications   Current Outpatient Rx  Name Route Sig Dispense Refill  . ACETAMINOPHEN 500 MG PO TABS Oral Take 1,000 mg by mouth every 6 (six) hours as needed. For pain    . ALBUTEROL SULFATE HFA 108 (90 BASE) MCG/ACT IN AERS Inhalation Inhale 2 puffs into the lungs every 4  (four) hours as needed. For shortness of breath.    . CLONIDINE HCL 0.2 MG PO TABS Oral Take 1 tablet (0.2 mg total) by mouth 2 (two) times daily. For blood pressure 60 tablet 1  . ADULT MULTIVITAMIN W/MINERALS CH Oral Take 1 tablet by mouth daily. Vitamin supplement.    [name] FISH OIL PO Oral Take 1 capsule by mouth daily.    [name] PANTOPRAZOLE SODIUM 40 MG PO TBEC Oral Take 1 tablet (40 mg total) by mouth daily. 14 tablet 0    BP 146/98  Pulse 82  Temp 98.3 F (36.8 C) (Oral)  Resp 18  SpO2 100%  Physical Exam  Constitutional: He is oriented to person, place, and time. He appears well-developed and well-nourished.  HENT:  Head: Normocephalic and atraumatic.  Eyes: Conjunctivae normal are normal. Pupils are equal, round, and reactive to light.  Neck: Normal range of motion. Neck supple.  Cardiovascular: Normal rate, regular rhythm, normal heart sounds and intact distal pulses.   Pulmonary/Chest: Effort normal and breath sounds normal.  Abdominal: Soft. Bowel sounds are normal. There is tenderness.    Neurological: He is alert and oriented to person, place, and time.  Skin: Skin is warm and dry.  Psychiatric: He has a normal mood and affect. His behavior is normal. Judgment and thought content normal.    ED Course  Procedures (including critical care time)  Labs Reviewed  CBC WITH DIFFERENTIAL - Abnormal; Notable for the following:  RBC 4.18 (*)     All other components within normal limits  LIPASE, BLOOD  BASIC METABOLIC PANEL   No results found.   No diagnosis found.   Date: 12/20/2011  Rate: 86  Rhythm: normal sinus rhythm  QRS Axis: normal  Intervals: normal  ST/T Wave abnormalities: nonspecific ST changes  Conduction Disutrbances:none  Narrative Interpretation:   Old EKG Reviewed: changes noted   MDM  Improved. Labs benign.  Noted ekg changes.  No cp.  ce neg.  Will dc to outpt fu        [name] Lytle Michaels, MD 12/20/11 0449  [name] Lytle Michaels,  MD 02/01/12 2000  Rosanne Ashing, MD 02/01/12 2119

## 2011-12-30 IMAGING — CR DG ABDOMEN ACUTE W/ 1V CHEST
3 series · 3 of 3 positions shown · non-contrast
Comparison: CT 05/05/2009, abdominal series 05/04/2009

CLINICAL DATA: Abdominal pain, shortness of breath, nausea

ACUTE ABDOMEN SERIES (ABDOMEN 2 VIEW & CHEST 1 VIEW)

[w chest pa]
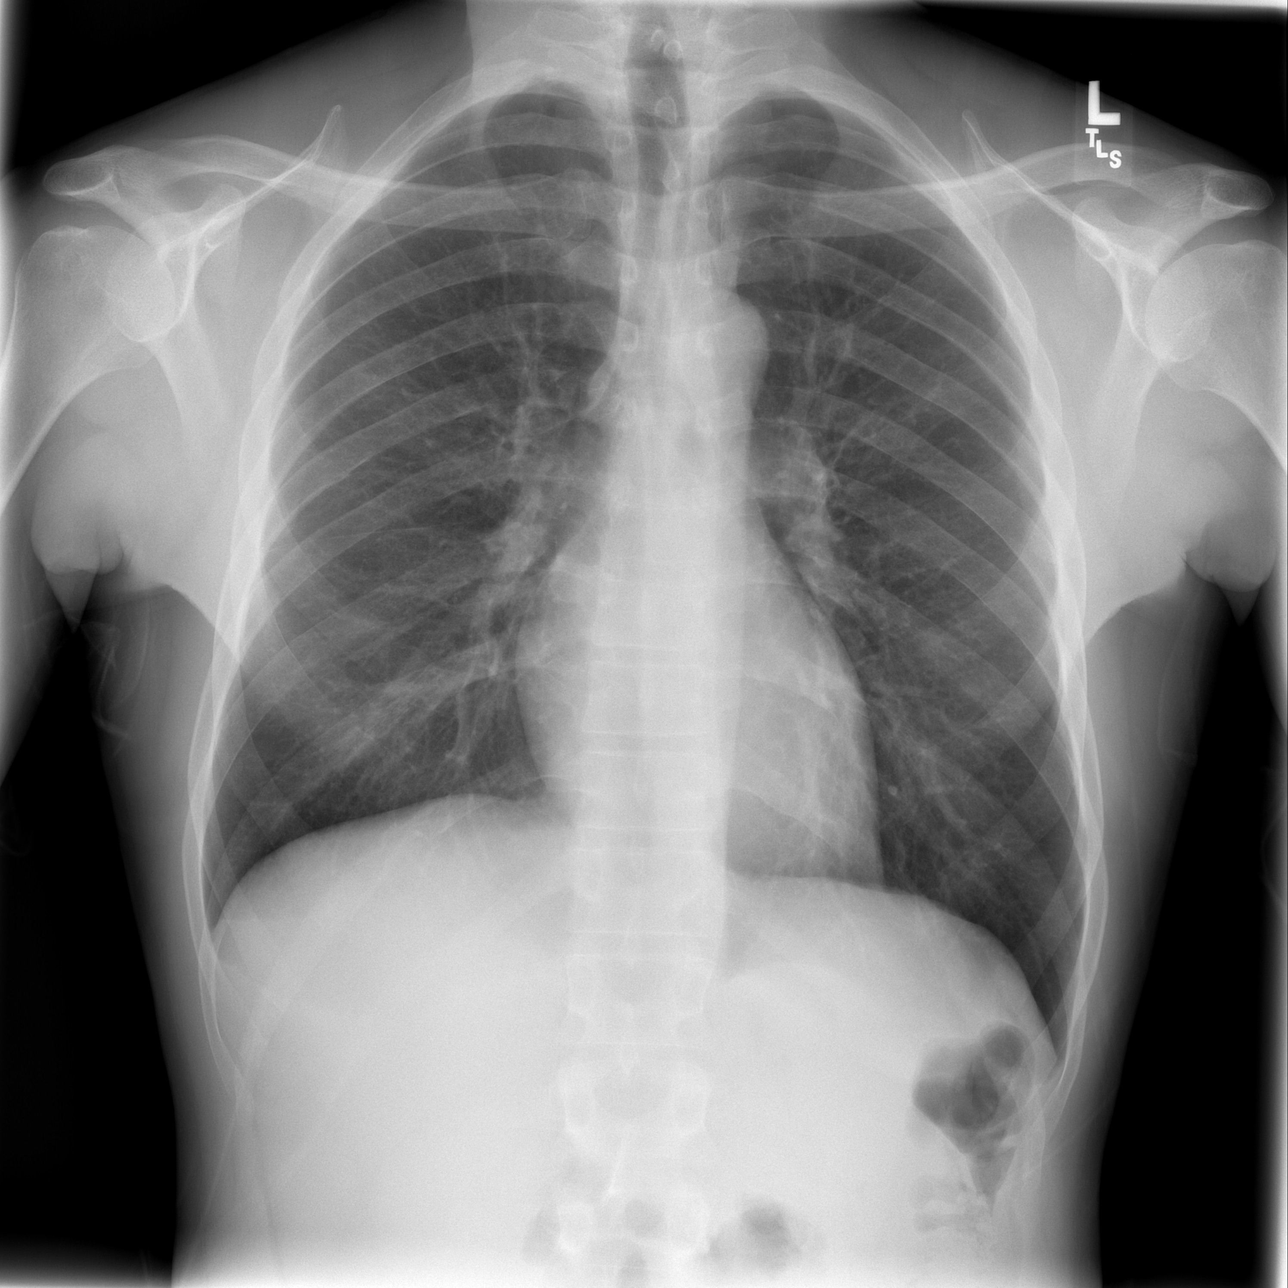

[w abdomen upright]
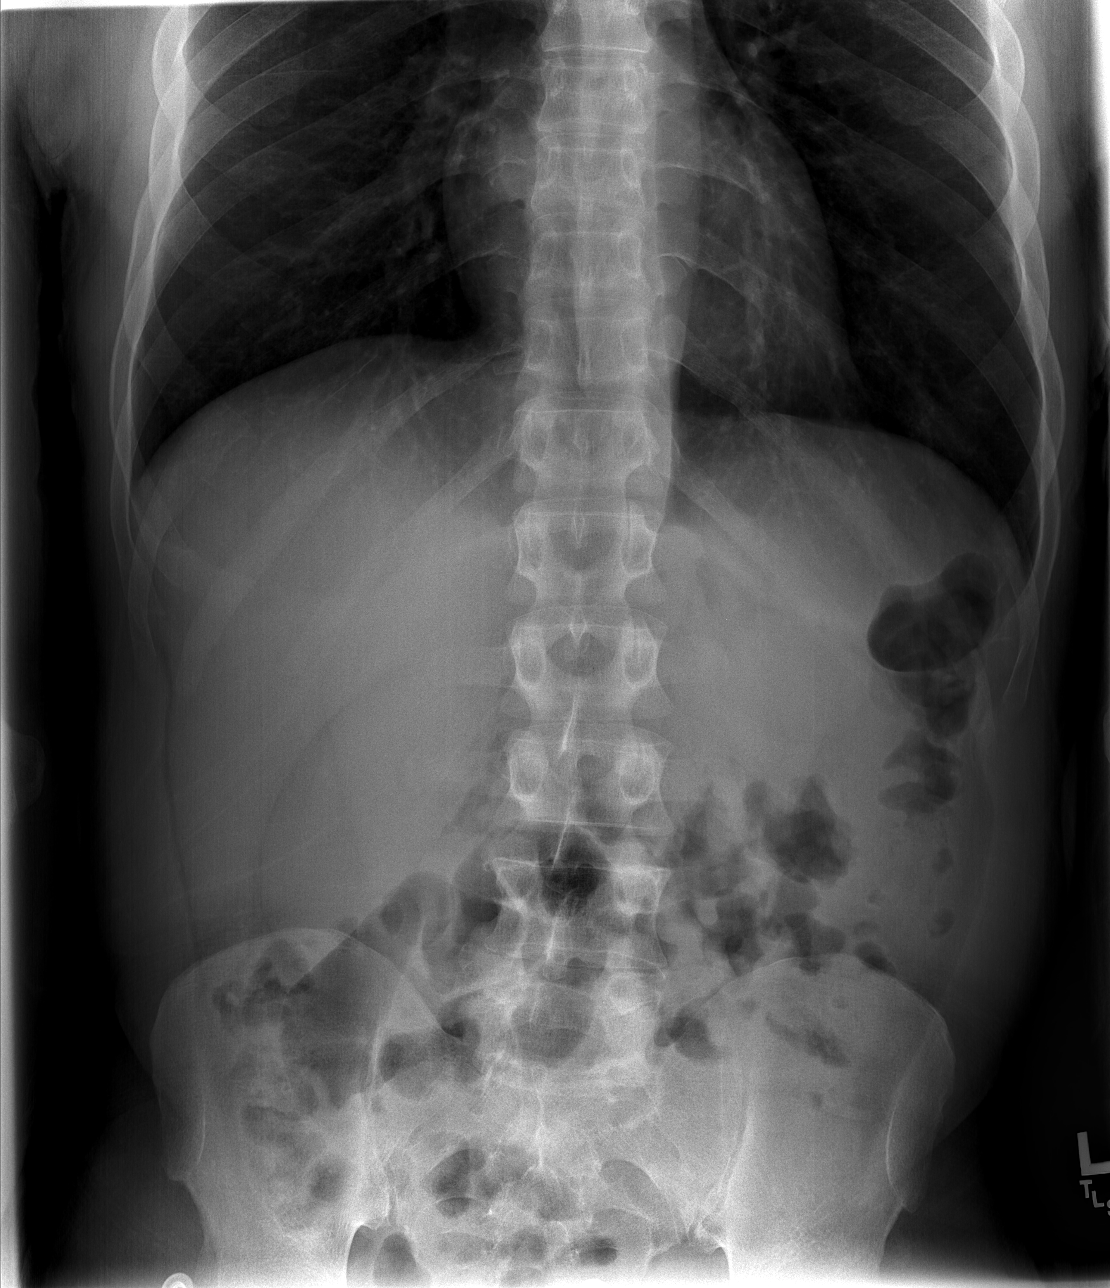

[t abdomen supine]
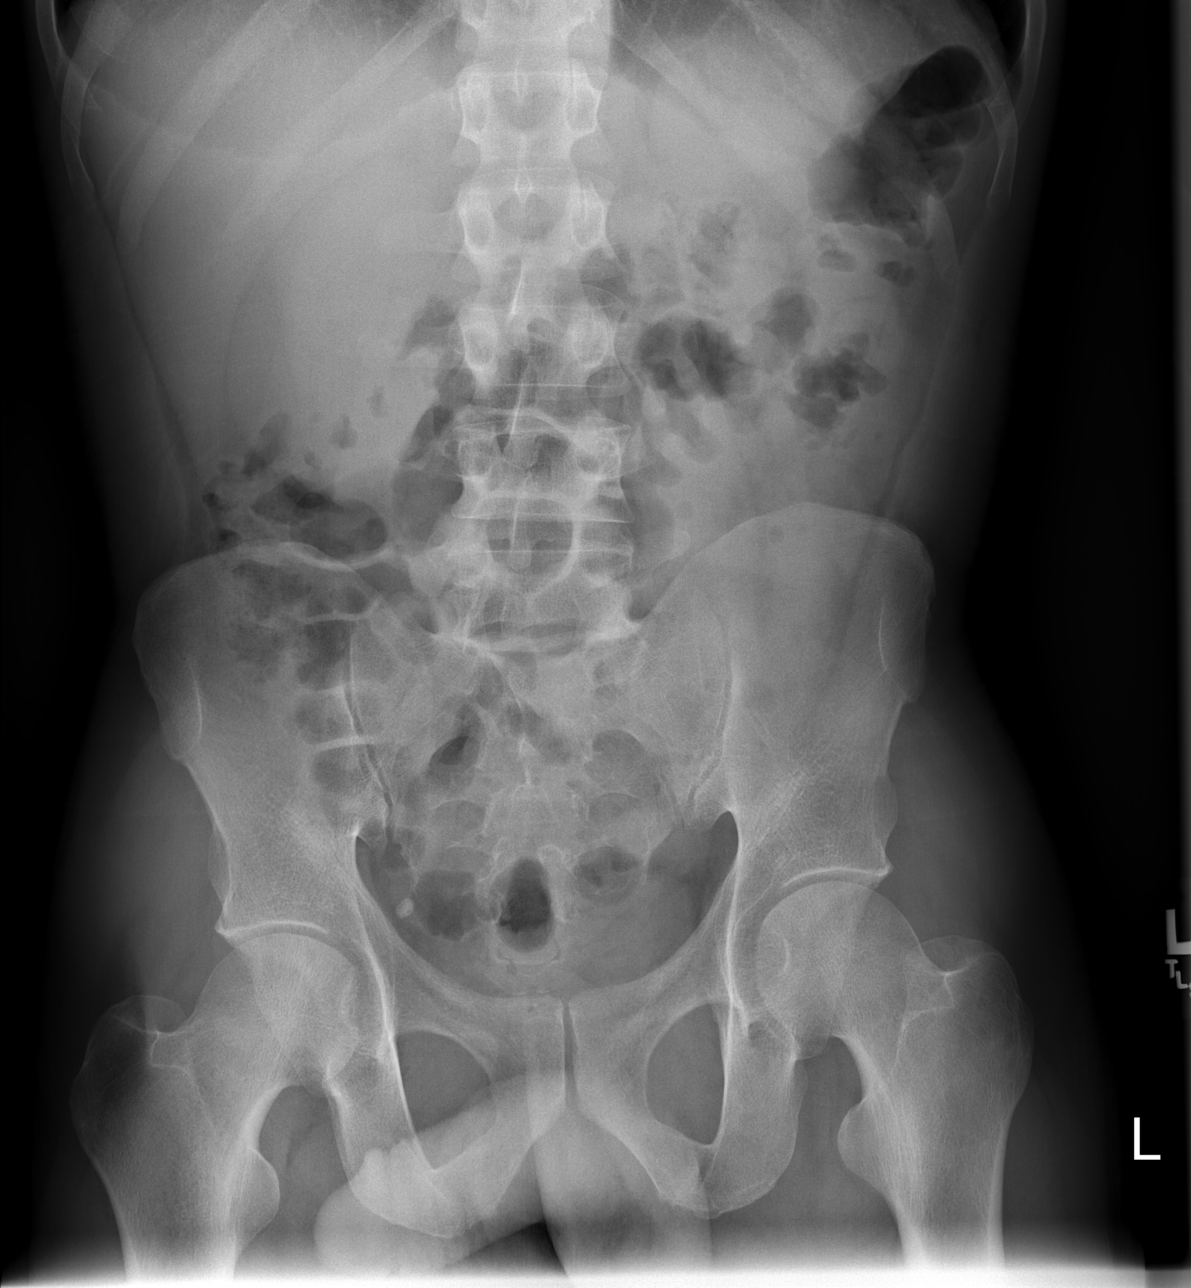

[3 of 3 positions shown; findings below may reference images not displayed]

FINDINGS: Heart size is normal.  The lungs are clear.  No free air
beneath the diaphragms.  No pleural effusion.

Bowel gas pattern is normal.  Oval opacity over the right pelvis is
stable, likely a phlebolith.  No acute bony abnormality.
IMPRESSION: No acute cardiopulmonary process.  Normal bowel gas pattern.

## 2012-01-12 ENCOUNTER — Encounter (HOSPITAL_COMMUNITY): Payer: Self-pay

## 2012-01-12 ENCOUNTER — Emergency Department (HOSPITAL_COMMUNITY): Payer: Self-pay

## 2012-01-12 ENCOUNTER — Emergency Department (HOSPITAL_COMMUNITY)
Admission: EM | Admit: 2012-01-12 | Discharge: 2012-01-13 | Disposition: A | Discharge status: Home / Self Care | Payer: Self-pay | Attending: Emergency Medicine | Admitting: Emergency Medicine

## 2012-01-12 DIAGNOSIS — F17200 Nicotine dependence, unspecified, uncomplicated: Secondary | ICD-10-CM | POA: Insufficient documentation

## 2012-01-12 DIAGNOSIS — Z8249 Family history of ischemic heart disease and other diseases of the circulatory system: Secondary | ICD-10-CM | POA: Insufficient documentation

## 2012-01-12 DIAGNOSIS — R748 Abnormal levels of other serum enzymes: Secondary | ICD-10-CM

## 2012-01-12 DIAGNOSIS — R1013 Epigastric pain: Secondary | ICD-10-CM | POA: Insufficient documentation

## 2012-01-12 DIAGNOSIS — R7402 Elevation of levels of lactic acid dehydrogenase (LDH): Secondary | ICD-10-CM | POA: Insufficient documentation

## 2012-01-12 DIAGNOSIS — I10 Essential (primary) hypertension: Secondary | ICD-10-CM | POA: Insufficient documentation

## 2012-01-12 DIAGNOSIS — F329 Major depressive disorder, single episode, unspecified: Secondary | ICD-10-CM | POA: Insufficient documentation

## 2012-01-12 DIAGNOSIS — J45909 Unspecified asthma, uncomplicated: Secondary | ICD-10-CM | POA: Insufficient documentation

## 2012-01-12 DIAGNOSIS — R7401 Elevation of levels of liver transaminase levels: Secondary | ICD-10-CM | POA: Insufficient documentation

## 2012-01-12 DIAGNOSIS — F3289 Other specified depressive episodes: Secondary | ICD-10-CM | POA: Insufficient documentation

## 2012-01-12 LAB — CBC WITH DIFFERENTIAL/PLATELET
Basophils Absolute: 0 10*3/uL (ref 0.0–0.1)
Basophils Relative: 0 % (ref 0–1)
Eosinophils Relative: 2 % (ref 0–5)
Lymphocytes Relative: 47 % — ABNORMAL HIGH (ref 12–46)
MCHC: 35.4 g/dL (ref 30.0–36.0)
Neutro Abs: 3.2 10*3/uL (ref 1.7–7.7)
Platelets: 204 10*3/uL (ref 150–400)
RDW: 13.2 % (ref 11.5–15.5)
WBC: 7.3 10*3/uL (ref 4.0–10.5)

## 2012-01-12 LAB — COMPREHENSIVE METABOLIC PANEL
Alkaline Phosphatase: 103 U/L (ref 39–117)
BUN: 7 mg/dL (ref 6–23)
CO2: 24 mEq/L (ref 19–32)
Chloride: 96 mEq/L (ref 96–112)
Creatinine, Ser: 0.74 mg/dL (ref 0.50–1.35)
GFR calc Af Amer: >90 mL/min (ref >90)
Glucose, Bld: 91 mg/dL (ref 70–99)
Sodium: 136 mEq/L (ref 135–145)
Total Bilirubin: 1.1 mg/dL (ref 0.3–1.2)
Total Protein: 8 g/dL (ref 6.0–8.3)

## 2012-01-12 LAB — URINALYSIS, ROUTINE W REFLEX MICROSCOPIC
Bilirubin Urine: NEGATIVE
Ketones, ur: NEGATIVE mg/dL
Nitrite: NEGATIVE
Specific Gravity, Urine: 1.022 (ref 1.005–1.030)
Urobilinogen, UA: 0.2 mg/dL (ref 0.0–1.0)

## 2012-01-12 LAB — LIPASE, BLOOD: Lipase: 29 U/L (ref 11–59)

## 2012-01-12 MED ORDER — OXYCODONE HCL 5 MG PO TABS
5.0000 mg | ORAL_TABLET | Freq: Once | ORAL | Status: AC
  Administered 2012-01-12: 5 mg via ORAL
  Filled 2012-01-12: qty 1

## 2012-01-12 MED ORDER — PANTOPRAZOLE SODIUM 40 MG PO TBEC
80.0000 mg | DELAYED_RELEASE_TABLET | Freq: Once | ORAL | Status: AC
  Administered 2012-01-12: 80 mg via ORAL
  Filled 2012-01-12: qty 2

## 2012-01-12 MED ORDER — GI COCKTAIL ~~LOC~~
30.0000 mL | Freq: Once | ORAL | Status: AC
  Administered 2012-01-12: 30 mL via ORAL
  Filled 2012-01-12: qty 30

## 2012-01-12 NOTE — ED Notes (Signed)
Pt reports mid abd pain radiating to (L) abd region and nausea starting Tuesday night. Pt reports a hx of pancreatitis and GERD. Pt denies any ETOH intake or eating greasy food, pt taking prevacid for GERD w/no relief.

## 2012-01-13 ENCOUNTER — Encounter: Payer: Self-pay | Admitting: Internal Medicine

## 2012-01-13 MED ORDER — SUCRALFATE 1 G PO TABS: 1.0000 g | ORAL_TABLET | Freq: Four times a day (QID) | ORAL | Status: DC

## 2012-01-13 MED ORDER — OXYCODONE HCL 5 MG PO TABS
5.0000 mg | ORAL_TABLET | Freq: Once | ORAL | Status: AC
  Administered 2012-01-13: 5 mg via ORAL
  Filled 2012-01-13: qty 1

## 2012-01-13 MED ORDER — PANTOPRAZOLE SODIUM 20 MG PO TBEC: 40.0000 mg | DELAYED_RELEASE_TABLET | Freq: Every day | ORAL | Status: DC

## 2012-01-13 NOTE — ED Provider Notes (Signed)
History     CSN: [id number]  Arrival date & time 01/12/12  1903   First MD Initiated Contact with Patient 01/12/12 2256      Chief Complaint  Patient presents with  . Abdominal Pain    (Consider location/radiation/quality/duration/timing/severity/associated sxs/prior treatment) HPI 41 year old male presents emergency department complaining of upper abdominal pain, worse after eating, nausea. Patient is concerned that his pancreatitis has returned. No history of gallstones, pancreatitis first noted in 2010 thought to be due to alcohol abuse. Patient reports he still occasionally drinks, but nothing like he used to. He has had nausea without vomiting no fever. He has had some diarrhea. Patient has history of gastritis/GERD, had been taking Protonix but is now taking Pepcid. He switched in June and reports he's had worsening symptoms since switching from Protonix to Pepcid. Patient reports symptoms are better when he has a very bland foods but even then he will have some burning of his upper abdomen.  Past Medical History  Diagnosis Date  . Hypertension   . Asthma   . Pancreatitis   . Cocaine abuse   . Depression     Past Surgical History  Procedure Date  . Cosmetic surgery     Family History  Problem Relation Age of Onset  . Hypertension Other   . Coronary artery disease Other     History  Substance Use Topics  . Smoking status: Current Everyday Smoker -- 0.5 packs/day    Types: Cigarettes  . Smokeless tobacco: Current User  . Alcohol Use: Yes     occasion      Review of Systems  All other systems reviewed and are negative.    Allergies  Trazodone and nefazodone and Shellfish-derived products  Home Medications   Current Outpatient Rx  Name Route Sig Dispense Refill  . ACETAMINOPHEN 500 MG PO TABS Oral Take 1,000 mg by mouth every 6 (six) hours as needed. For pain    . ALBUTEROL SULFATE HFA 108 (90 BASE) MCG/ACT IN AERS Inhalation Inhale 2 puffs into the  lungs every 4 (four) hours as needed. For shortness of breath.    . CLONIDINE HCL 0.2 MG PO TABS Oral Take 1 tablet (0.2 mg total) by mouth 2 (two) times daily. For blood pressure 60 tablet 1  . LANSOPRAZOLE 30 MG PO CPDR Oral Take 30 mg by mouth 2 (two) times daily.    . ADULT MULTIVITAMIN W/MINERALS CH Oral Take 1 tablet by mouth daily. Vitamin supplement.    [name] FISH OIL PO Oral Take 1 capsule by mouth daily.      BP 158/99  Pulse 98  Temp 98.1 F (36.7 C) (Oral)  Resp 19  SpO2 100%  Physical Exam  Nursing note and vitals reviewed. Constitutional: He is oriented to person, place, and time. He appears well-developed and well-nourished.  HENT:  Head: Normocephalic and atraumatic.  Nose: Nose normal.  Mouth/Throat: Oropharynx is clear and moist.  Eyes: Conjunctivae and EOM are normal. Pupils are equal, round, and reactive to light.  Neck: Normal range of motion. Neck supple. No JVD present. No tracheal deviation present. No thyromegaly present.  Cardiovascular: Normal rate, regular rhythm, normal heart sounds and intact distal pulses.  Exam reveals no gallop and no friction rub.   No murmur heard. Pulmonary/Chest: Effort normal and breath sounds normal. No stridor. No respiratory distress. He has no wheezes. He has no rales. He exhibits no tenderness.  Abdominal: Soft. He exhibits no distension and no mass. There is tenderness (  epigastric, neg murphy sign). There is no rebound and no guarding.       Hyperactive bowel sounds  Musculoskeletal: Normal range of motion. He exhibits no edema and no tenderness.  Lymphadenopathy:    He has no cervical adenopathy.  Neurological: He is alert and oriented to person, place, and time. He has normal reflexes. He exhibits normal muscle tone. Coordination normal.  Skin: Skin is warm and dry. No rash noted. No erythema. No pallor.  Psychiatric: He has a normal mood and affect. His behavior is normal. Judgment and thought content normal.    ED Course   Procedures (including critical care time)  Labs Reviewed  CBC WITH DIFFERENTIAL - Abnormal; Notable for the following:    Lymphocytes Relative 47 (*)     All other components within normal limits  COMPREHENSIVE METABOLIC PANEL - Abnormal; Notable for the following:    Calcium 11.0 (*)     AST 109 (*)     ALT 88 (*)     All other components within normal limits  LIPASE, BLOOD  URINALYSIS, ROUTINE W REFLEX MICROSCOPIC   US Abdomen Complete  01/13/2012  *RADIOLOGY REPORT*  Clinical Data:  Mid abdominal pain with nausea for 2 days.  History of pancreatitis.  COMPLETE ABDOMINAL ULTRASOUND  Comparison:  Abdominal CT 08/18/2010.  Abdominal ultrasound 05/05/2010.  Findings:  Gallbladder: Well distended without wall thickening, stones or pericholecystic fluid. Negative sonographic Murphy's sign.  Common bile duct:   There is mild fusiform dilatation proximally. The duct tapers distally.  There is no evidence of choledocholithiasis.  This appears new compared with the prior ultrasound.  Liver:  Echogenicity is within normal limits.  No focal hepatic abnormalities are identified.  IVC:  Visualized portions appear unremarkable.  Pancreas:  Visualized portions appear unremarkable.  Spleen:  Visualized portions appear unremarkable.  The spleen is suboptimally visualized.  Right Kidney:   The renal cortical thickness and echogenicity are preserved.  There is no hydronephrosis or focal abnormality. Renal length is 11.3 cm.  Left Kidney:   The renal cortical thickness and echogenicity are preserved.  There is no hydronephrosis or focal abnormality. Renal length is 11.0 cm.  Abdominal aorta:  Visualized portions appear unremarkable.  IMPRESSION:  1.  New mild biliary dilatation without evidence of choledocholithiasis.  This is nonspecific and could be related to prior pancreatitis. 2.  The visualized pancreas appears unremarkable. 3.  No evidence of cholelithiasis or cholecystitis.   Original Report Authenticated By:  Gerrianne Scale, M.D.      1. Epigastric abdominal pain   2. Elevated liver enzymes       MDM  41 year old male with epigastric pain history of both pancreatitis and GERD. Lipase not significantly elevated, AST ALT noted to have slight elevation. Patient has been advised that he should not be drinking alcohol at all. Suspect this is gastritis over pancreatitis or other cause. Ultrasound without significant findings, other than mild biliary dilatation. Will start patient on Carafate, switch back to Protonix and have him followup with GI        Olivia Mackie, MD 01/13/12 [phone]

## 2012-01-13 NOTE — ED Notes (Signed)
Pt out of room in radiology at this time 

## 2012-02-07 ENCOUNTER — Encounter: Payer: Self-pay | Admitting: Internal Medicine

## 2012-02-08 ENCOUNTER — Ambulatory Visit: Payer: Self-pay | Admitting: Internal Medicine

## 2012-02-16 ENCOUNTER — Emergency Department (HOSPITAL_COMMUNITY): Payer: Self-pay

## 2012-02-16 ENCOUNTER — Encounter (HOSPITAL_COMMUNITY): Payer: Self-pay | Admitting: Emergency Medicine

## 2012-02-16 ENCOUNTER — Emergency Department (HOSPITAL_COMMUNITY)
Admission: EM | Admit: 2012-02-16 | Discharge: 2012-02-16 | Disposition: A | Discharge status: Home / Self Care | Payer: Self-pay | Attending: Emergency Medicine | Admitting: Emergency Medicine

## 2012-02-16 DIAGNOSIS — J45909 Unspecified asthma, uncomplicated: Secondary | ICD-10-CM | POA: Insufficient documentation

## 2012-02-16 DIAGNOSIS — F17200 Nicotine dependence, unspecified, uncomplicated: Secondary | ICD-10-CM | POA: Insufficient documentation

## 2012-02-16 DIAGNOSIS — Z79899 Other long term (current) drug therapy: Secondary | ICD-10-CM | POA: Insufficient documentation

## 2012-02-16 DIAGNOSIS — S0003XA Contusion of scalp, initial encounter: Secondary | ICD-10-CM | POA: Insufficient documentation

## 2012-02-16 DIAGNOSIS — F329 Major depressive disorder, single episode, unspecified: Secondary | ICD-10-CM | POA: Insufficient documentation

## 2012-02-16 DIAGNOSIS — I10 Essential (primary) hypertension: Secondary | ICD-10-CM | POA: Insufficient documentation

## 2012-02-16 DIAGNOSIS — F3289 Other specified depressive episodes: Secondary | ICD-10-CM | POA: Insufficient documentation

## 2012-02-16 DIAGNOSIS — S0990XA Unspecified injury of head, initial encounter: Secondary | ICD-10-CM | POA: Insufficient documentation

## 2012-02-16 MED ORDER — TRAMADOL HCL 50 MG PO TABS: 50.0000 mg | ORAL_TABLET | Freq: Four times a day (QID) | ORAL | Status: DC | PRN

## 2012-02-16 MED ORDER — IBUPROFEN 800 MG PO TABS
800.0000 mg | ORAL_TABLET | Freq: Once | ORAL | Status: AC
  Administered 2012-02-16: 800 mg via ORAL
  Filled 2012-02-16: qty 1
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
## 2012-02-16 NOTE — ED Notes (Signed)
Pt to CT

## 2012-02-16 NOTE — ED Notes (Signed)
Pt returned from CT °
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
## 2012-05-03 ENCOUNTER — Emergency Department (HOSPITAL_COMMUNITY)
Admission: EM | Admit: 2012-05-03 | Discharge: 2012-05-03 | Disposition: A | Discharge status: Home / Self Care | Payer: Self-pay | Attending: Emergency Medicine | Admitting: Emergency Medicine

## 2012-05-03 ENCOUNTER — Encounter (HOSPITAL_COMMUNITY): Payer: Self-pay | Admitting: Emergency Medicine

## 2012-05-03 DIAGNOSIS — K2920 Alcoholic gastritis without bleeding: Secondary | ICD-10-CM | POA: Insufficient documentation

## 2012-05-03 DIAGNOSIS — K861 Other chronic pancreatitis: Secondary | ICD-10-CM | POA: Insufficient documentation

## 2012-05-03 DIAGNOSIS — I10 Essential (primary) hypertension: Secondary | ICD-10-CM | POA: Insufficient documentation

## 2012-05-03 DIAGNOSIS — F1410 Cocaine abuse, uncomplicated: Secondary | ICD-10-CM | POA: Insufficient documentation

## 2012-05-03 DIAGNOSIS — Z8659 Personal history of other mental and behavioral disorders: Secondary | ICD-10-CM | POA: Insufficient documentation

## 2012-05-03 DIAGNOSIS — F17200 Nicotine dependence, unspecified, uncomplicated: Secondary | ICD-10-CM | POA: Insufficient documentation

## 2012-05-03 DIAGNOSIS — Z79899 Other long term (current) drug therapy: Secondary | ICD-10-CM | POA: Insufficient documentation

## 2012-05-03 LAB — CBC WITH DIFFERENTIAL/PLATELET
Basophils Absolute: 0 10*3/uL (ref 0.0–0.1)
Eosinophils Absolute: 0.2 10*3/uL (ref 0.0–0.7)
Eosinophils Relative: 2 % (ref 0–5)
MCH: 33.1 pg (ref 26.0–34.0)
MCV: 95.7 fL (ref 78.0–100.0)
Platelets: 204 10*3/uL (ref 150–400)
RDW: 12.9 % (ref 11.5–15.5)

## 2012-05-03 LAB — COMPREHENSIVE METABOLIC PANEL
ALT: 73 U/L — ABNORMAL HIGH (ref 0–53)
AST: 71 U/L — ABNORMAL HIGH (ref 0–37)
Calcium: 9 mg/dL (ref 8.4–10.5)
GFR calc Af Amer: >90 mL/min (ref >90)
Sodium: 135 mEq/L (ref 135–145)
Total Protein: 7.6 g/dL (ref 6.0–8.3)

## 2012-05-03 LAB — URINALYSIS, ROUTINE W REFLEX MICROSCOPIC
Bilirubin Urine: NEGATIVE
Hgb urine dipstick: NEGATIVE
Nitrite: NEGATIVE
Specific Gravity, Urine: 1.028 (ref 1.005–1.030)
pH: 5.5 (ref 5.0–8.0)

## 2012-05-03 MED ORDER — FENTANYL CITRATE 0.05 MG/ML IJ SOLN
50.0000 ug | Freq: Once | INTRAMUSCULAR | Status: AC
  Administered 2012-05-03: 50 ug via INTRAVENOUS

## 2012-05-03 MED ORDER — GI COCKTAIL ~~LOC~~
30.0000 mL | Freq: Once | ORAL | Status: AC
  Administered 2012-05-03: 30 mL via ORAL
  Filled 2012-05-03: qty 30

## 2012-05-03 MED ORDER — MORPHINE SULFATE 4 MG/ML IJ SOLN
4.0000 mg | Freq: Once | INTRAMUSCULAR | Status: AC
  Administered 2012-05-03: 4 mg via INTRAVENOUS
  Filled 2012-05-03: qty 1

## 2012-05-03 MED ORDER — FENTANYL CITRATE 0.05 MG/ML IJ SOLN
INTRAMUSCULAR | Status: AC
  Filled 2012-05-03: qty 2

## 2012-05-03 NOTE — ED Provider Notes (Signed)
History     CSN: [id number]  Arrival date & time 05/03/12  1844   First MD Initiated Contact with Patient 05/03/12 2100      Chief Complaint  Patient presents with  . Abdominal Pain    (Consider location/radiation/quality/duration/timing/severity/associated sxs/prior treatment) HPI Comments: This is a 41 year old male, who presents emergency department with chief complaint of left upper quadrant pain. Patient states that the pain started yesterday, and has worsened throughout the course of the day. States it is worse when he takes deep breath. Patient has past medical history remarkable for pancreatitis. States the pain does not radiate. He states that he has not had a drink in the past 2 months, with the exception of yesterday. He states that his pain is 8/10. He states that his pain is worsened with eating. Had one episode of vomiting last night. He denies chest pain, shortness of breath, nausea, diarrhea, constipation.   The history is provided by the patient. No language interpreter was used.    Past Medical History  Diagnosis Date  . Hypertension   . Asthma   . Pancreatitis   . Cocaine abuse   . Depression     Past Surgical History  Procedure Date  . Cosmetic surgery     Family History  Problem Relation Age of Onset  . Hypertension Other   . Coronary artery disease Other     History  Substance Use Topics  . Smoking status: Current Every Day Smoker -- 1.0 packs/day    Types: Cigarettes  . Smokeless tobacco: Current User  . Alcohol Use: No     Comment: occasion      Review of Systems  All other systems reviewed and are negative.    Allergies  Trazodone and nefazodone and Shellfish-derived products  Home Medications   Current Outpatient Rx  Name  Route  Sig  Dispense  Refill  . ALBUTEROL SULFATE HFA 108 (90 BASE) MCG/ACT IN AERS   Inhalation   Inhale 2 puffs into the lungs every 4 (four) hours as needed. For shortness of breath.         .  CLONIDINE HCL 0.2 MG PO TABS   Oral   Take 1 tablet (0.2 mg total) by mouth 2 (two) times daily. For blood pressure   60 tablet   1   . FAMOTIDINE 20 MG PO TABS   Oral   Take 20 mg by mouth 2 (two) times daily.         . ADULT MULTIVITAMIN W/MINERALS CH   Oral   Take 1 tablet by mouth daily. Vitamin supplement.         . SUCRALFATE 1 G PO TABS   Oral   Take 1 tablet (1 g total) by mouth 4 (four) times daily.   30 tablet   0   . TRAMADOL HCL 50 MG PO TABS   Oral   Take 1 tablet (50 mg total) by mouth every 6 (six) hours as needed for pain.   10 tablet   0     BP 136/85  Pulse 96  Temp 98.8 F (37.1 C) (Oral)  Resp 16  SpO2 100%  Physical Exam  Nursing note and vitals reviewed. Constitutional: He is oriented to person, place, and time. He appears well-developed and well-nourished.  HENT:  Head: Normocephalic and atraumatic.  Right Ear: External ear normal.  Left Ear: External ear normal.  Nose: Nose normal.  Mouth/Throat: Oropharynx is clear and moist. No oropharyngeal exudate.  Eyes: Conjunctivae normal and EOM are normal. Pupils are equal, round, and reactive to light. Right eye exhibits no discharge. Left eye exhibits no discharge. No scleral icterus.  Neck: Normal range of motion. Neck supple. No JVD present.  Cardiovascular: Normal rate, regular rhythm, normal heart sounds and intact distal pulses.  Exam reveals no gallop and no friction rub.   No murmur heard. Pulmonary/Chest: Effort normal and breath sounds normal. No respiratory distress. He has no wheezes. He has no rales. He exhibits no tenderness.  Abdominal: Soft. Bowel sounds are normal. He exhibits no distension and no mass. There is no tenderness. There is no rebound and no guarding.       No right upper quadrant tenderness, no tenderness at McBurney's point, no peritoneal signs.  Musculoskeletal: Normal range of motion. He exhibits no edema and no tenderness.  Neurological: He is alert and  oriented to person, place, and time. He has normal reflexes.       CN 3-12 intact  Skin: Skin is warm and dry.  Psychiatric: He has a normal mood and affect. His behavior is normal. Judgment and thought content normal.    ED Course  Procedures (including critical care time)  Labs Reviewed  CBC WITH DIFFERENTIAL - Abnormal; Notable for the following:    WBC 10.6 (*)     RBC 3.75 (*)     Hemoglobin 12.4 (*)     HCT 35.9 (*)     All other components within normal limits  COMPREHENSIVE METABOLIC PANEL - Abnormal; Notable for the following:    AST 71 (*)     ALT 73 (*)     GFR calc non Af Amer 79 (*)     All other components within normal limits  LIPASE, BLOOD  URINALYSIS, ROUTINE W REFLEX MICROSCOPIC   Results for orders placed during the hospital encounter of 05/03/12  LIPASE, BLOOD      Component Value Range   Lipase 43  11 - 59 U/L  CBC WITH DIFFERENTIAL      Component Value Range   WBC 10.6 (*) 4.0 - 10.5 K/uL   RBC 3.75 (*) 4.22 - 5.81 MIL/uL   Hemoglobin 12.4 (*) 13.0 - 17.0 g/dL   HCT 16.1 (*) 09.6 - 04.5 %   MCV 95.7  78.0 - 100.0 fL   MCH 33.1  26.0 - 34.0 pg   MCHC 34.5  30.0 - 36.0 g/dL   RDW 40.9  81.1 - 91.4 %   Platelets 204  150 - 400 K/uL   Neutrophils Relative 56  43 - 77 %   Neutro Abs 5.9  1.7 - 7.7 K/uL   Lymphocytes Relative 33  12 - 46 %   Lymphs Abs 3.4  0.7 - 4.0 K/uL   Monocytes Relative 10  3 - 12 %   Monocytes Absolute 1.0  0.1 - 1.0 K/uL   Eosinophils Relative 2  0 - 5 %   Eosinophils Absolute 0.2  0.0 - 0.7 K/uL   Basophils Relative 0  0 - 1 %   Basophils Absolute 0.0  0.0 - 0.1 K/uL  COMPREHENSIVE METABOLIC PANEL      Component Value Range   Sodium 135  135 - 145 mEq/L   Potassium 3.6  3.5 - 5.1 mEq/L   Chloride 99  96 - 112 mEq/L   CO2 22  19 - 32 mEq/L   Glucose, Bld 82  70 - 99 mg/dL   BUN 14  6 -  23 mg/dL   Creatinine, Ser 4.09  0.50 - 1.35 mg/dL   Calcium 9.0  8.4 - 81.1 mg/dL   Total Protein 7.6  6.0 - 8.3 g/dL   Albumin 3.8   3.5 - 5.2 g/dL   AST 71 (*) 0 - 37 U/L   ALT 73 (*) 0 - 53 U/L   Alkaline Phosphatase 87  39 - 117 U/L   Total Bilirubin 0.5  0.3 - 1.2 mg/dL   GFR calc non Af Amer 79 (*) >90 mL/min   GFR calc Af Amer >90  >90 mL/min  URINALYSIS, ROUTINE W REFLEX MICROSCOPIC      Component Value Range   Color, Urine YELLOW  YELLOW   APPearance CLEAR  CLEAR   Specific Gravity, Urine 1.028  1.005 - 1.030   pH 5.5  5.0 - 8.0   Glucose, UA NEGATIVE  NEGATIVE mg/dL   Hgb urine dipstick NEGATIVE  NEGATIVE   Bilirubin Urine NEGATIVE  NEGATIVE   Ketones, ur NEGATIVE  NEGATIVE mg/dL   Protein, ur NEGATIVE  NEGATIVE mg/dL   Urobilinogen, UA 0.2  0.0 - 1.0 mg/dL   Nitrite NEGATIVE  NEGATIVE   Leukocytes, UA NEGATIVE  NEGATIVE   ED ECG REPORT  I personally interpreted this EKG   Date: 05/03/2012   Rate: 91  Rhythm: normal sinus rhythm  QRS Axis: normal  Intervals: Borderline short PR interval  ST/T Wave abnormalities: normal  Conduction Disutrbances:none  Narrative Interpretation:   Old EKG Reviewed: unchanged     1. Alcoholic gastritis       MDM  41 year old male with epigastric pain. I will give the patient GI cocktail and reevaluate.  9:51 PM Patient states that the medicine helped settle his stomach, but that he is still having the same pain.  Will give a dose of morphine.  10:34 PM Patient states that his symptoms have improved. I'm going to discharge the patient to home with primary care followup. Encouraged the patient to avoid alcohol. Patient understands and agrees with the plan. He is stable and ready for discharge.        Roxy Horseman, PA-C 05/03/12 2236  Roxy Horseman, PA-C 05/03/12 [phone]

## 2012-05-03 NOTE — ED Notes (Signed)
Pt notified that we need urine sample, st's he doesn't have to go.  Handed pt call bell and instructed him to call us if he needs to go.

## 2012-05-03 NOTE — ED Notes (Signed)
Pt brought to ED via [facility] complaining of left upper quadrant pain that is worse when he takes a deep breath.  Denies any chest pain, lung sounds clear.  Pt has history of pancreatitis.  Pt placed on cardiac monitor, NAD at this time.

## 2012-05-03 NOTE — ED Notes (Signed)
Pt called out requesting pain medication, st's pain is 8/10.  Will give pt of fentanyl per pain protocol.

## 2012-05-03 NOTE — ED Notes (Addendum)
Pt presents with epigastric pain that began this morning around 0630, describes the pain as a burning sensation, pain has worsened throughout the day.  Pt has had a headache throughout the day and states he began feeling dizzy about an hour ago, denies any chest pain or SOB.  Pt vomited once last night, denies diarrhea.

## 2012-05-03 NOTE — ED Notes (Signed)
EKG shown to Dr. Wentz 

## 2012-05-04 NOTE — ED Provider Notes (Signed)
Medical screening examination/treatment/procedure(s) were performed by non-physician practitioner and as supervising physician I was immediately available for consultation/collaboration.  Flint Melter, MD 05/04/12 [phone]

## 2012-05-16 ENCOUNTER — Encounter (HOSPITAL_COMMUNITY): Payer: Self-pay | Admitting: *Deleted

## 2012-05-16 ENCOUNTER — Emergency Department (HOSPITAL_COMMUNITY)
Admission: EM | Admit: 2012-05-16 | Discharge: 2012-05-16 | Disposition: A | Discharge status: Home / Self Care | Payer: Self-pay | Attending: Emergency Medicine | Admitting: Emergency Medicine

## 2012-05-16 DIAGNOSIS — I10 Essential (primary) hypertension: Secondary | ICD-10-CM | POA: Insufficient documentation

## 2012-05-16 DIAGNOSIS — R112 Nausea with vomiting, unspecified: Secondary | ICD-10-CM | POA: Insufficient documentation

## 2012-05-16 DIAGNOSIS — F1410 Cocaine abuse, uncomplicated: Secondary | ICD-10-CM | POA: Insufficient documentation

## 2012-05-16 DIAGNOSIS — Z8659 Personal history of other mental and behavioral disorders: Secondary | ICD-10-CM | POA: Insufficient documentation

## 2012-05-16 DIAGNOSIS — Z8719 Personal history of other diseases of the digestive system: Secondary | ICD-10-CM | POA: Insufficient documentation

## 2012-05-16 DIAGNOSIS — J45909 Unspecified asthma, uncomplicated: Secondary | ICD-10-CM | POA: Insufficient documentation

## 2012-05-16 DIAGNOSIS — K2920 Alcoholic gastritis without bleeding: Secondary | ICD-10-CM | POA: Insufficient documentation

## 2012-05-16 DIAGNOSIS — F17200 Nicotine dependence, unspecified, uncomplicated: Secondary | ICD-10-CM | POA: Insufficient documentation

## 2012-05-16 LAB — COMPREHENSIVE METABOLIC PANEL
BUN: 11 mg/dL (ref 6–23)
Calcium: 9.3 mg/dL (ref 8.4–10.5)
Creatinine, Ser: 0.81 mg/dL (ref 0.50–1.35)
GFR calc Af Amer: >90 mL/min (ref >90)
GFR calc non Af Amer: >90 mL/min (ref >90)
Glucose, Bld: 100 mg/dL — ABNORMAL HIGH (ref 70–99)
Total Protein: 7.8 g/dL (ref 6.0–8.3)

## 2012-05-16 LAB — CBC WITH DIFFERENTIAL/PLATELET
Basophils Absolute: 0 K/uL (ref 0.0–0.1)
Basophils Relative: 0 % (ref 0–1)
Eosinophils Absolute: 0.1 K/uL (ref 0.0–0.7)
Eosinophils Relative: 1 % (ref 0–5)
HCT: 41.6 % (ref 39.0–52.0)
Hemoglobin: 14.4 g/dL (ref 13.0–17.0)
Lymphocytes Relative: 31 % (ref 12–46)
Lymphs Abs: 2.1 K/uL (ref 0.7–4.0)
MCH: 32.1 pg (ref 26.0–34.0)
MCHC: 34.6 g/dL (ref 30.0–36.0)
MCV: 92.9 fL (ref 78.0–100.0)
Monocytes Absolute: 0.7 K/uL (ref 0.1–1.0)
Monocytes Relative: 10 % (ref 3–12)
Neutro Abs: 4 K/uL (ref 1.7–7.7)
Neutrophils Relative %: 58 % (ref 43–77)
Platelets: 203 K/uL (ref 150–400)
RBC: 4.48 MIL/uL (ref 4.22–5.81)
RDW: 12.7 % (ref 11.5–15.5)
WBC: 6.9 K/uL (ref 4.0–10.5)

## 2012-05-16 LAB — URINALYSIS, MICROSCOPIC ONLY
Nitrite: NEGATIVE
Protein, ur: NEGATIVE mg/dL
Specific Gravity, Urine: >1.03 — ABNORMAL HIGH (ref 1.005–1.030)
Urobilinogen, UA: 0.2 mg/dL (ref 0.0–1.0)

## 2012-05-16 LAB — POCT I-STAT TROPONIN I

## 2012-05-16 LAB — LIPASE, BLOOD: Lipase: 24 U/L (ref 11–59)

## 2012-05-16 MED ORDER — PANTOPRAZOLE SODIUM 40 MG IV SOLR
40.0000 mg | Freq: Once | INTRAVENOUS | Status: AC
  Administered 2012-05-16: 40 mg via INTRAVENOUS
  Filled 2012-05-16: qty 40

## 2012-05-16 MED ORDER — MORPHINE SULFATE 4 MG/ML IJ SOLN
4.0000 mg | Freq: Once | INTRAMUSCULAR | Status: AC
  Administered 2012-05-16: 4 mg via INTRAVENOUS
  Filled 2012-05-16: qty 1

## 2012-05-16 MED ORDER — GI COCKTAIL ~~LOC~~: 30.0000 mL | Freq: Two times a day (BID) | ORAL | Status: DC

## 2012-05-16 MED ORDER — ONDANSETRON HCL 4 MG/2ML IJ SOLN
4.0000 mg | Freq: Once | INTRAMUSCULAR | Status: AC
  Administered 2012-05-16: 4 mg via INTRAVENOUS
  Filled 2012-05-16: qty 2

## 2012-05-16 MED ORDER — SODIUM CHLORIDE 0.9 % IV BOLUS (SEPSIS)
1000.0000 mL | Freq: Once | INTRAVENOUS | Status: AC
  Administered 2012-05-16: 1000 mL via INTRAVENOUS

## 2012-05-16 MED ORDER — GI COCKTAIL ~~LOC~~
30.0000 mL | Freq: Once | ORAL | Status: AC
  Administered 2012-05-16: 30 mL via ORAL
  Filled 2012-05-16: qty 30

## 2012-05-16 MED ORDER — OMEPRAZOLE 20 MG PO CPDR: 20.0000 mg | DELAYED_RELEASE_CAPSULE | Freq: Every day | ORAL | Status: DC

## 2012-05-16 NOTE — ED Notes (Signed)
Pt reports having flare up of pancreatitis and acid reflux. Having abd pain and n/v. Denies diarrhea.

## 2012-05-16 NOTE — ED Provider Notes (Signed)
History     CSN: [id number]  Arrival date & time 05/16/12  1019   First MD Initiated Contact with Patient 05/16/12 1039      Chief Complaint  Patient presents with  . Abdominal Pain  . Vomiting    (Consider location/radiation/quality/duration/timing/severity/associated sxs/prior treatment) HPI Comments: Patient is a 42 year old male with a past medical history of pancreatitis who presents with a 1 day history of abdominal pain. The pain is located in the epigastrium and does not radiate. The pain is described as sharp and severe. The pain started gradually and progressively worsened since the onset. No alleviating/aggravating factors. The patient has tried nothing for symptoms without relief. Associated symptoms include nausea and vomiting. 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 His last drink was 2 nights ago. Patient denies fever, headache, diarrhea, chest pain, SOB, dysuria, constipation.      Past Medical History  Diagnosis Date  . Hypertension   . Asthma   . Pancreatitis   . Cocaine abuse   . Depression     Past Surgical History  Procedure Date  . Cosmetic surgery     Family History  Problem Relation Age of Onset  . Hypertension Other   . Coronary artery disease Other     History  Substance Use Topics  . Smoking status: Current Every Day Smoker -- 1.0 packs/day    Types: Cigarettes  . Smokeless tobacco: Current User  . Alcohol Use: No     Comment: occasion      Review of Systems  Gastrointestinal: Positive for nausea, vomiting and abdominal pain.  All other systems reviewed and are negative.    Allergies  Trazodone and nefazodone and Shellfish-derived products  Home Medications   Current Outpatient Rx  Name  Route  Sig  Dispense  Refill  . ALBUTEROL SULFATE HFA 108 (90 BASE) MCG/ACT IN AERS   Inhalation   Inhale 2 puffs into the lungs every 4 (four) hours as needed. For shortness of breath.         .  CLONIDINE HCL 0.2 MG PO TABS   Oral   Take 1 tablet (0.2 mg total) by mouth 2 (two) times daily. For blood pressure   60 tablet   1   . FAMOTIDINE 20 MG PO TABS   Oral   Take 20 mg by mouth 2 (two) times daily.         . QUETIAPINE FUMARATE ER 150 MG PO TB24   Oral   Take 150 mg by mouth at bedtime.           BP 134/106  Pulse 118  Temp 97.3 F (36.3 C) (Oral)  Resp 18  SpO2 100%  Physical Exam  Nursing note and vitals reviewed. Constitutional: He is oriented to person, place, and time. He appears well-developed and well-nourished. No distress.  HENT:  Head: Normocephalic and atraumatic.  Mouth/Throat: Oropharynx is clear and moist.  Eyes: Conjunctivae normal and EOM are normal. Pupils are equal, round, and reactive to light. No scleral icterus.  Neck: Normal range of motion. Neck supple.  Cardiovascular: Normal rate and regular rhythm.  Exam reveals no gallop and no friction rub.   No murmur heard. Pulmonary/Chest: Effort normal and breath sounds normal. He has no wheezes. He has no rales. He exhibits no tenderness.  Abdominal: Soft. He exhibits no distension. There is tenderness. There is no rebound and no guarding.       Epigastric tenderness  to palpation.   Musculoskeletal: Normal range of motion.  Neurological: He is alert and oriented to person, place, and time. Coordination normal.       Speech is goal-oriented. Moves limbs without ataxia.   Skin: Skin is warm and dry. He is not diaphoretic.  Psychiatric: He has a normal mood and affect. His behavior is normal.    ED Course  Procedures (including critical care time)  Labs Reviewed  COMPREHENSIVE METABOLIC PANEL - Abnormal; Notable for the following:    Sodium 133 (*)     Chloride 92 (*)     Glucose, Bld 100 (*)     AST 383 (*)     ALT 179 (*)     Total Bilirubin 1.3 (*)     All other components within normal limits  URINALYSIS, MICROSCOPIC ONLY - Abnormal; Notable for the following:    APPearance  HAZY (*)     Specific Gravity, Urine >1.030 (*)     All other components within normal limits  CBC WITH DIFFERENTIAL  LIPASE, BLOOD  POCT I-STAT TROPONIN I   No results found.   1. Alcoholic gastritis       MDM  16:10 AM Labs pending. Patient will have gi cocktail and IV protonix.    1:30 PM Labs show elevated liver enzymes likely due to alcohol. Patient likely experiencing alcoholic gastritis. Patient will be discharged with omeprazole and GI cocktail. Tachycardia resolved. Patient can be discharged without further evaluation.      Emilia Beck, PA-C 05/16/12 1334

## 2012-05-16 NOTE — ED Provider Notes (Signed)
Medical screening examination/treatment/procedure(s) were performed by non-physician practitioner and as supervising physician I was immediately available for consultation/collaboration.   Charles B. Bernette Mayers, MD 05/16/12 1334

## 2012-06-24 ENCOUNTER — Emergency Department (HOSPITAL_COMMUNITY)
Admission: EM | Admit: 2012-06-24 | Discharge: 2012-06-24 | Disposition: A | Discharge status: Home / Self Care | Payer: Self-pay | Attending: Emergency Medicine | Admitting: Emergency Medicine

## 2012-06-24 ENCOUNTER — Encounter (HOSPITAL_COMMUNITY): Payer: Self-pay | Admitting: Emergency Medicine

## 2012-06-24 DIAGNOSIS — F17200 Nicotine dependence, unspecified, uncomplicated: Secondary | ICD-10-CM | POA: Insufficient documentation

## 2012-06-24 DIAGNOSIS — Z79899 Other long term (current) drug therapy: Secondary | ICD-10-CM | POA: Insufficient documentation

## 2012-06-24 DIAGNOSIS — Z8719 Personal history of other diseases of the digestive system: Secondary | ICD-10-CM | POA: Insufficient documentation

## 2012-06-24 DIAGNOSIS — J45909 Unspecified asthma, uncomplicated: Secondary | ICD-10-CM | POA: Insufficient documentation

## 2012-06-24 DIAGNOSIS — F3289 Other specified depressive episodes: Secondary | ICD-10-CM | POA: Insufficient documentation

## 2012-06-24 DIAGNOSIS — K5289 Other specified noninfective gastroenteritis and colitis: Secondary | ICD-10-CM | POA: Insufficient documentation

## 2012-06-24 DIAGNOSIS — R109 Unspecified abdominal pain: Secondary | ICD-10-CM

## 2012-06-24 DIAGNOSIS — F329 Major depressive disorder, single episode, unspecified: Secondary | ICD-10-CM | POA: Insufficient documentation

## 2012-06-24 DIAGNOSIS — R1013 Epigastric pain: Secondary | ICD-10-CM | POA: Insufficient documentation

## 2012-06-24 DIAGNOSIS — F1410 Cocaine abuse, uncomplicated: Secondary | ICD-10-CM | POA: Insufficient documentation

## 2012-06-24 DIAGNOSIS — I10 Essential (primary) hypertension: Secondary | ICD-10-CM | POA: Insufficient documentation

## 2012-06-24 DIAGNOSIS — K2970 Gastritis, unspecified, without bleeding: Secondary | ICD-10-CM

## 2012-06-24 LAB — CBC WITH DIFFERENTIAL/PLATELET
Basophils Relative: 0 % (ref 0–1)
HCT: 40.6 % (ref 39.0–52.0)
Hemoglobin: 14.6 g/dL (ref 13.0–17.0)
Lymphs Abs: 4 10*3/uL (ref 0.7–4.0)
MCH: 32.9 pg (ref 26.0–34.0)
MCHC: 36 g/dL (ref 30.0–36.0)
Monocytes Absolute: 0.4 10*3/uL (ref 0.1–1.0)
Monocytes Relative: 5 % (ref 3–12)
Neutro Abs: 2.9 10*3/uL (ref 1.7–7.7)
Neutrophils Relative %: 39 % — ABNORMAL LOW (ref 43–77)
RBC: 4.44 MIL/uL (ref 4.22–5.81)

## 2012-06-24 LAB — POCT I-STAT, CHEM 8
BUN: 10 mg/dL (ref 6–23)
Creatinine, Ser: 0.9 mg/dL (ref 0.50–1.35)
Glucose, Bld: 90 mg/dL (ref 70–99)
Hemoglobin: 16.3 g/dL (ref 13.0–17.0)
Potassium: 3.9 mEq/L (ref 3.5–5.1)
Sodium: 138 mEq/L (ref 135–145)

## 2012-06-24 MED ORDER — PROMETHAZINE HCL 25 MG/ML IJ SOLN
12.5000 mg | Freq: Four times a day (QID) | INTRAMUSCULAR | Status: DC | PRN
  Administered 2012-06-24: 12.5 mg via INTRAVENOUS
  Filled 2012-06-24: qty 1

## 2012-06-24 MED ORDER — PANTOPRAZOLE SODIUM 40 MG IV SOLR
40.0000 mg | Freq: Once | INTRAVENOUS | Status: AC
  Administered 2012-06-24: 40 mg via INTRAVENOUS
  Filled 2012-06-24: qty 40

## 2012-06-24 MED ORDER — PANTOPRAZOLE SODIUM 40 MG PO TBEC: 40.0000 mg | DELAYED_RELEASE_TABLET | Freq: Every day | ORAL | Status: DC

## 2012-06-24 MED ORDER — GI COCKTAIL ~~LOC~~
30.0000 mL | Freq: Once | ORAL | Status: AC
  Administered 2012-06-24: 30 mL via ORAL
  Filled 2012-06-24: qty 30

## 2012-06-24 MED ORDER — SODIUM CHLORIDE 0.9 % IV BOLUS (SEPSIS)
1000.0000 mL | Freq: Once | INTRAVENOUS | Status: AC
  Administered 2012-06-24: 1000 mL via INTRAVENOUS

## 2012-06-24 MED ORDER — DIPHENHYDRAMINE HCL 50 MG/ML IJ SOLN
25.0000 mg | Freq: Once | INTRAMUSCULAR | Status: AC
  Administered 2012-06-24: 25 mg via INTRAVENOUS
  Filled 2012-06-24 (×2): qty 1

## 2012-06-24 MED ORDER — METOCLOPRAMIDE HCL 5 MG/ML IJ SOLN
10.0000 mg | Freq: Once | INTRAMUSCULAR | Status: AC
  Administered 2012-06-24: 10 mg via INTRAVENOUS
  Filled 2012-06-24 (×2): qty 2

## 2012-06-24 MED ORDER — ONDANSETRON HCL 4 MG/2ML IJ SOLN
4.0000 mg | Freq: Once | INTRAMUSCULAR | Status: AC
  Administered 2012-06-24: 4 mg via INTRAVENOUS
  Filled 2012-06-24: qty 2

## 2012-06-24 MED ORDER — MORPHINE SULFATE 4 MG/ML IJ SOLN
4.0000 mg | Freq: Once | INTRAMUSCULAR | Status: AC
  Administered 2012-06-24: 4 mg via INTRAVENOUS
  Filled 2012-06-24: qty 1

## 2012-06-24 MED ORDER — FAMOTIDINE 20 MG PO TABS
20.0000 mg | ORAL_TABLET | Freq: Once | ORAL | Status: AC
Start: 2012-06-24 — End: 2012-06-24
  Administered 2012-06-24: 20 mg via ORAL
  Filled 2012-06-24: qty 1

## 2012-06-24 NOTE — ED Provider Notes (Signed)
History     CSN: [id number]  Arrival date & time 06/24/12  1801   First MD Initiated Contact with Patient 06/24/12 1924      Chief Complaint  Patient presents with  . Abdominal Pain    (Consider location/radiation/quality/duration/timing/severity/associated sxs/prior treatment) Patient is a 42 y.o. male presenting with abdominal pain. The history is provided by the patient.  Abdominal Pain Associated symptoms: no chest pain, no fever and no shortness of breath   pt c/o epigastric pain. States started after drink etoh last pm. Constant, dull, non radiating. Same pain as with prior gastritis related to etoh use/abuse. No back or flank pain. 1-2 episodes nv, emesis clear, not bloody or bilious. 1 diarrhea stool today. No mid to lower abd pain. No fever or chills. No chest pain or discomfort. No sob.      Past Medical History  Diagnosis Date  . Hypertension   . Asthma   . Pancreatitis   . Cocaine abuse   . Depression     Past Surgical History  Procedure Laterality Date  . Cosmetic surgery    . Eye surgery      Family History  Problem Relation Age of Onset  . Hypertension Other   . Coronary artery disease Other     History  Substance Use Topics  . Smoking status: Current Every Day Smoker -- 1.00 packs/day    Types: Cigarettes  . Smokeless tobacco: Current User  . Alcohol Use: Yes     Comment:  last use yesterday (06/23/12)      Review of Systems  Constitutional: Negative for fever.  HENT: Negative for neck pain.   Eyes: Negative for redness.  Respiratory: Negative for shortness of breath.   Cardiovascular: Negative for chest pain.  Gastrointestinal: Positive for abdominal pain.  Genitourinary: Negative for flank pain.  Musculoskeletal: Negative for back pain.  Skin: Negative for rash.  Neurological: Negative for headaches.  Hematological: Does not bruise/bleed easily.  Psychiatric/Behavioral: Negative for confusion.    Allergies  Trazodone and  nefazodone and Shellfish-derived products  Home Medications   Current Outpatient Rx  Name  Route  Sig  Dispense  Refill  . albuterol (PROVENTIL HFA;VENTOLIN HFA) 108 (90 BASE) MCG/ACT inhaler   Inhalation   Inhale 2 puffs into the lungs every 4 (four) hours as needed. For shortness of breath.         . Alum & Mag Hydroxide-Simeth (GI COCKTAIL) SUSP suspension   Oral   Take 30 mLs by mouth 2 (two) times daily. Shake well.   120 mL   0   . cloNIDine (CATAPRES) 0.2 MG tablet   Oral   Take 1 tablet (0.2 mg total) by mouth 2 (two) times daily. For blood pressure   60 tablet   1   . famotidine (PEPCID) 20 MG tablet   Oral   Take 20 mg by mouth 2 (two) times daily.         [name] omeprazole (PRILOSEC) 20 MG capsule   Oral   Take 1 capsule (20 mg total) by mouth daily.   30 capsule   1   . QUEtiapine Fumarate (SEROQUEL XR) 150 MG 24 hr tablet   Oral   Take 150 mg by mouth at bedtime.           BP 122/83  Pulse 113  Temp(Src) 97.8 F (36.6 C) (Oral)  Resp 20  SpO2 98%  Physical Exam  Nursing note and vitals reviewed. Constitutional: He is oriented  to person, place, and time. He appears well-developed and well-nourished. No distress.  HENT:  Mouth/Throat: Oropharynx is clear and moist.  Eyes: Conjunctivae are normal. No scleral icterus.  Neck: Neck supple. No tracheal deviation present.  Cardiovascular: Regular rhythm, normal heart sounds and intact distal pulses.   Pulmonary/Chest: Effort normal and breath sounds normal. No accessory muscle usage. No respiratory distress.  Abdominal: Soft. Bowel sounds are normal. He exhibits no distension and no mass. There is tenderness. There is no rebound and no guarding.  Epigastric tenderness, no rebound or guarding.   Musculoskeletal: Normal range of motion. He exhibits no edema and no tenderness.  Neurological: He is alert and oriented to person, place, and time.  Skin: Skin is warm and dry.  Psychiatric: He has a normal  mood and affect.    ED Course  Procedures (including critical care time)  Results for orders placed during the hospital encounter of 06/24/12  CBC WITH DIFFERENTIAL      Result Value Range   WBC 7.5  4.0 - 10.5 K/uL   RBC 4.44  4.22 - 5.81 MIL/uL   Hemoglobin 14.6  13.0 - 17.0 g/dL   HCT 28.4  13.2 - 44.0 %   MCV 91.4  78.0 - 100.0 fL   MCH 32.9  26.0 - 34.0 pg   MCHC 36.0  30.0 - 36.0 g/dL   RDW 10.2  72.5 - 36.6 %   Platelets 239  150 - 400 K/uL   Neutrophils Relative 39 (*) 43 - 77 %   Neutro Abs 2.9  1.7 - 7.7 K/uL   Lymphocytes Relative 53 (*) 12 - 46 %   Lymphs Abs 4.0  0.7 - 4.0 K/uL   Monocytes Relative 5  3 - 12 %   Monocytes Absolute 0.4  0.1 - 1.0 K/uL   Eosinophils Relative 2  0 - 5 %   Eosinophils Absolute 0.2  0.0 - 0.7 K/uL   Basophils Relative 0  0 - 1 %   Basophils Absolute 0.0  0.0 - 0.1 K/uL  LIPASE, BLOOD      Result Value Range   Lipase 54  11 - 59 U/L  POCT I-STAT, CHEM 8      Result Value Range   Sodium 138  135 - 145 mEq/L   Potassium 3.9  3.5 - 5.1 mEq/L   Chloride 105  96 - 112 mEq/L   BUN 10  6 - 23 mg/dL   Creatinine, Ser 4.40  0.50 - 1.35 mg/dL   Glucose, Bld 90  70 - 99 mg/dL   Calcium, Ion 3.47  4.25 - 1.23 mmol/L   TCO2 22  0 - 100 mmol/L   Hemoglobin 16.3  13.0 - 17.0 g/dL   HCT 95.6  38.7 - 56.4 %       MDM  Iv ns bolus. protonix iv. Morphine iv. zofran iv.  pepcid and gi cocktail po.  Reviewed nursing notes and prior charts for additional history.   Signed out to oncoming provider to recheck post meds and ivf.  If improved, abd exam benign, nv resolved, may d/c then w outpt follow up.         Suzi Roots, MD 06/24/12 [phone]

## 2012-06-24 NOTE — ED Notes (Addendum)
Pt c/o abdominal pain onset this morning. Pt has history of pancreatitis. Pt took zantac, tylenol and mylanta without relief. Pt admits to ETOH use yesterday. Pt reports n/v/d.

## 2012-06-24 NOTE — ED Provider Notes (Signed)
[name] S 8:00 PM patient discussed in sign out with Dr. Arizona Constable.  Patient with multiple prior visits to the emergency room with history of alcoholism and gastritis. Patient's workup has been normal and unremarkable today. No concerning signs on abdominal exam. Patient awaiting treatment for suspected gastritis related to alcohol use last evening. Will reassess after treatments.   Patient reports improvement after fluids and initial medications. Complains of some return of discomfort. We'll give dose of Reglan.  Patient now feels much better has been resting and sleeping. He does not appear in any acute distress. Abdomen exam is benign without peritoneal signs. Patient reports some continued nausea. We'll give dose of Phenergan and discharged with prescription for Protonix.    [name], PA 06/24/12 2249

## 2012-06-24 NOTE — ED Notes (Signed)
Pt states "that medicine helped me out but it's worn off."

## 2012-06-24 NOTE — ED Notes (Signed)
Pt fell asleep while this RN was giving him phenergan after pt stated pain was still 6/10

## 2012-06-27 ENCOUNTER — Emergency Department (HOSPITAL_COMMUNITY)
Admission: EM | Admit: 2012-06-27 | Discharge: 2012-06-27 | Disposition: A | Discharge status: Home / Self Care | Payer: Self-pay | Attending: Emergency Medicine | Admitting: Emergency Medicine

## 2012-06-27 ENCOUNTER — Encounter (HOSPITAL_COMMUNITY): Payer: Self-pay | Admitting: Emergency Medicine

## 2012-06-27 DIAGNOSIS — F17200 Nicotine dependence, unspecified, uncomplicated: Secondary | ICD-10-CM | POA: Insufficient documentation

## 2012-06-27 DIAGNOSIS — I10 Essential (primary) hypertension: Secondary | ICD-10-CM | POA: Insufficient documentation

## 2012-06-27 DIAGNOSIS — J45909 Unspecified asthma, uncomplicated: Secondary | ICD-10-CM | POA: Insufficient documentation

## 2012-06-27 DIAGNOSIS — F3289 Other specified depressive episodes: Secondary | ICD-10-CM | POA: Insufficient documentation

## 2012-06-27 DIAGNOSIS — Z8719 Personal history of other diseases of the digestive system: Secondary | ICD-10-CM | POA: Insufficient documentation

## 2012-06-27 DIAGNOSIS — F1410 Cocaine abuse, uncomplicated: Secondary | ICD-10-CM | POA: Insufficient documentation

## 2012-06-27 DIAGNOSIS — Z79899 Other long term (current) drug therapy: Secondary | ICD-10-CM | POA: Insufficient documentation

## 2012-06-27 DIAGNOSIS — R1110 Vomiting, unspecified: Secondary | ICD-10-CM | POA: Insufficient documentation

## 2012-06-27 DIAGNOSIS — K2920 Alcoholic gastritis without bleeding: Secondary | ICD-10-CM | POA: Insufficient documentation

## 2012-06-27 LAB — COMPREHENSIVE METABOLIC PANEL
Albumin: 4.4 g/dL (ref 3.5–5.2)
Alkaline Phosphatase: 108 U/L (ref 39–117)
BUN: 13 mg/dL (ref 6–23)
Creatinine, Ser: 0.69 mg/dL (ref 0.50–1.35)
GFR calc Af Amer: >90 mL/min (ref >90)
Glucose, Bld: 113 mg/dL — ABNORMAL HIGH (ref 70–99)
Potassium: 3.6 mEq/L (ref 3.5–5.1)
Total Protein: 8.7 g/dL — ABNORMAL HIGH (ref 6.0–8.3)

## 2012-06-27 LAB — URINE MICROSCOPIC-ADD ON

## 2012-06-27 LAB — CBC WITH DIFFERENTIAL/PLATELET
Basophils Relative: 0 % (ref 0–1)
Eosinophils Absolute: 0.1 10*3/uL (ref 0.0–0.7)
Eosinophils Relative: 1 % (ref 0–5)
HCT: 40.7 % (ref 39.0–52.0)
Hemoglobin: 14.5 g/dL (ref 13.0–17.0)
Lymphs Abs: 3.2 10*3/uL (ref 0.7–4.0)
MCH: 32.5 pg (ref 26.0–34.0)
MCHC: 35.6 g/dL (ref 30.0–36.0)
MCV: 91.3 fL (ref 78.0–100.0)
Monocytes Absolute: 0.4 10*3/uL (ref 0.1–1.0)
Monocytes Relative: 5 % (ref 3–12)
Neutrophils Relative %: 55 % (ref 43–77)
RBC: 4.46 MIL/uL (ref 4.22–5.81)

## 2012-06-27 LAB — URINALYSIS, ROUTINE W REFLEX MICROSCOPIC
Glucose, UA: NEGATIVE mg/dL
Protein, ur: NEGATIVE mg/dL

## 2012-06-27 MED ORDER — ONDANSETRON 4 MG PO TBDP
8.0000 mg | ORAL_TABLET | Freq: Once | ORAL | Status: AC
  Administered 2012-06-27: 8 mg via ORAL
  Filled 2012-06-27 (×2): qty 1

## 2012-06-27 MED ORDER — ONDANSETRON HCL 8 MG PO TABS: 8.0000 mg | ORAL_TABLET | Freq: Three times a day (TID) | ORAL | Status: DC | PRN

## 2012-06-27 MED ORDER — GI COCKTAIL ~~LOC~~
30.0000 mL | Freq: Once | ORAL | Status: AC
Start: 2012-06-27 — End: 2012-06-27
  Administered 2012-06-27: 30 mL via ORAL
  Filled 2012-06-27: qty 30

## 2012-06-27 NOTE — ED Notes (Signed)
Pt st's he was here on 2/16 for abd pain and vomiting. Was dx with gastritis.  Pt st's he started having nausea, vomiting and mid upper abd pain again today

## 2012-06-27 NOTE — ED Provider Notes (Signed)
History     CSN: [id number]  Arrival date & time 06/27/12  1751   First MD Initiated Contact with Patient 06/27/12 2153      Chief Complaint  Patient presents with  . Abdominal Pain    (Consider location/radiation/quality/duration/timing/severity/associated sxs/prior treatment) HPI Comments: [name] is a 42 y.o. male who is here for abdominal pain and vomiting. The symptoms are persistent despite being evaluated and treated for the same problem on 06/24/12. At that time he was noted to have recurrent symptoms of alcoholic gastritis. He, states that he is not drinking. He is taking the Protonix that was prescribed. He has had 2 doses. He vomited 3 times today, with clear fluid noted. There are no no modifying factors.  Patient is a 42 y.o. male presenting with abdominal pain. The history is provided by the patient.  Abdominal Pain   Past Medical History  Diagnosis Date  . Hypertension   . Asthma   . Pancreatitis   . Cocaine abuse   . Depression     Past Surgical History  Procedure Laterality Date  . Cosmetic surgery    . Eye surgery      Family History  Problem Relation Age of Onset  . Hypertension Other   . Coronary artery disease Other     History  Substance Use Topics  . Smoking status: Current Every Day Smoker -- 1.00 packs/day    Types: Cigarettes  . Smokeless tobacco: Current User  . Alcohol Use: Yes     Comment:  last use yesterday (06/23/12)      Review of Systems  Gastrointestinal: Positive for abdominal pain.  All other systems reviewed and are negative.    Allergies  Trazodone and nefazodone and Shellfish-derived products  Home Medications   Current Outpatient Rx  Name  Route  Sig  Dispense  Refill  . albuterol (PROVENTIL HFA;VENTOLIN HFA) 108 (90 BASE) MCG/ACT inhaler   Inhalation   Inhale 2 puffs into the lungs every 4 (four) hours as needed. For shortness of breath.         . cloNIDine (CATAPRES) 0.2 MG tablet   Oral   Take  1 tablet (0.2 mg total) by mouth 2 (two) times daily. For blood pressure   60 tablet   1   . pantoprazole (PROTONIX) 40 MG tablet   Oral   Take 1 tablet (40 mg total) by mouth daily.   30 tablet   0   . QUEtiapine Fumarate (SEROQUEL XR) 150 MG 24 hr tablet   Oral   Take 150 mg by mouth at bedtime.         . ondansetron (ZOFRAN) 8 MG tablet   Oral   Take 1 tablet (8 mg total) by mouth every 8 (eight) hours as needed for nausea.   20 tablet   0     BP 144/94  Pulse 94  Temp(Src) 98.8 F (37.1 C) (Oral)  Resp 18  SpO2 99%  Physical Exam  Nursing note and vitals reviewed. Constitutional: He is oriented to person, place, and time. He appears well-developed and well-nourished. He appears distressed (facial grimace).  HENT:  Head: Normocephalic and atraumatic.  Right Ear: External ear normal.  Left Ear: External ear normal.  Eyes: Conjunctivae and EOM are normal. Pupils are equal, round, and reactive to light.  Neck: Normal range of motion and phonation normal. Neck supple.  Cardiovascular: Normal rate, regular rhythm, normal heart sounds and intact distal pulses.   Pulmonary/Chest: Effort  normal and breath sounds normal. He exhibits no bony tenderness.  Abdominal: Soft. Normal appearance. There is tenderness (Epigastric, mild).  Musculoskeletal: Normal range of motion.  Neurological: He is alert and oriented to person, place, and time. He has normal strength. No cranial nerve deficit or sensory deficit. He exhibits normal muscle tone. Coordination normal.  Skin: Skin is warm, dry and intact.  Psychiatric: His behavior is normal. Judgment and thought content normal.  Appears anxious    ED Course  Procedures (including critical care time)  Emergency department treatment: GI cocktail, and Zofran; order. After I told him what I will give him, he requested "pain medicine". I informed him that these medications would help his discomfort.  Labs Reviewed  COMPREHENSIVE  METABOLIC PANEL - Abnormal; Notable for the following:    Glucose, Bld 113 (*)    Total Protein 8.7 (*)    AST 91 (*)    ALT 78 (*)    All other components within normal limits  URINALYSIS, ROUTINE W REFLEX MICROSCOPIC - Abnormal; Notable for the following:    Leukocytes, UA TRACE (*)    All other components within normal limits  CBC WITH DIFFERENTIAL  URINE MICROSCOPIC-ADD ON      1. Gastritis without hemorrhage due to alcohol       MDM  Recurrent upper abdominal pain, with history of polysubstance abuse. He has had numerous tests in the last 6 months for signs of pancreatitis, and they have all been negative. This was most recently done 2 days ago. There is an element of drug-seeking behavior.         Flint Melter, MD 06/28/12 Jacinta Shoe

## 2012-08-17 ENCOUNTER — Encounter (HOSPITAL_COMMUNITY): Payer: Self-pay | Admitting: *Deleted

## 2012-08-17 ENCOUNTER — Emergency Department (HOSPITAL_COMMUNITY)
Admission: EM | Admit: 2012-08-17 | Discharge: 2012-08-18 | Disposition: A | Discharge status: Home / Self Care | Payer: Self-pay | Attending: Emergency Medicine | Admitting: Emergency Medicine

## 2012-08-17 ENCOUNTER — Emergency Department (HOSPITAL_COMMUNITY): Payer: Self-pay

## 2012-08-17 DIAGNOSIS — R1013 Epigastric pain: Secondary | ICD-10-CM

## 2012-08-17 DIAGNOSIS — F17200 Nicotine dependence, unspecified, uncomplicated: Secondary | ICD-10-CM | POA: Insufficient documentation

## 2012-08-17 DIAGNOSIS — J45909 Unspecified asthma, uncomplicated: Secondary | ICD-10-CM | POA: Insufficient documentation

## 2012-08-17 DIAGNOSIS — K859 Acute pancreatitis without necrosis or infection, unspecified: Secondary | ICD-10-CM | POA: Insufficient documentation

## 2012-08-17 DIAGNOSIS — F329 Major depressive disorder, single episode, unspecified: Secondary | ICD-10-CM | POA: Insufficient documentation

## 2012-08-17 DIAGNOSIS — Z79899 Other long term (current) drug therapy: Secondary | ICD-10-CM | POA: Insufficient documentation

## 2012-08-17 DIAGNOSIS — F3289 Other specified depressive episodes: Secondary | ICD-10-CM | POA: Insufficient documentation

## 2012-08-17 DIAGNOSIS — I10 Essential (primary) hypertension: Secondary | ICD-10-CM | POA: Insufficient documentation

## 2012-08-17 DIAGNOSIS — F1410 Cocaine abuse, uncomplicated: Secondary | ICD-10-CM | POA: Insufficient documentation

## 2012-08-17 LAB — URINALYSIS, ROUTINE W REFLEX MICROSCOPIC
Glucose, UA: NEGATIVE mg/dL
Hgb urine dipstick: NEGATIVE
Ketones, ur: NEGATIVE mg/dL
Leukocytes, UA: NEGATIVE
Protein, ur: NEGATIVE mg/dL

## 2012-08-17 LAB — CBC WITH DIFFERENTIAL/PLATELET
Basophils Absolute: 0 10*3/uL (ref 0.0–0.1)
Basophils Relative: 0 % (ref 0–1)
Eosinophils Relative: 1 % (ref 0–5)
HCT: 39.3 % (ref 39.0–52.0)
Hemoglobin: 13.9 g/dL (ref 13.0–17.0)
Lymphs Abs: 3.5 10*3/uL (ref 0.7–4.0)
MCHC: 35.4 g/dL (ref 30.0–36.0)
MCV: 92 fL (ref 78.0–100.0)
Monocytes Absolute: 0.5 10*3/uL (ref 0.1–1.0)
RBC: 4.27 MIL/uL (ref 4.22–5.81)
RDW: 14.3 % (ref 11.5–15.5)

## 2012-08-17 LAB — COMPREHENSIVE METABOLIC PANEL
AST: 137 U/L — ABNORMAL HIGH (ref 0–37)
Albumin: 3.9 g/dL (ref 3.5–5.2)
Calcium: 9.4 mg/dL (ref 8.4–10.5)
Creatinine, Ser: 0.84 mg/dL (ref 0.50–1.35)
GFR calc non Af Amer: >90 mL/min (ref >90)
Potassium: 4.2 mEq/L (ref 3.5–5.1)

## 2012-08-17 MED ORDER — GI COCKTAIL ~~LOC~~
30.0000 mL | Freq: Once | ORAL | Status: AC
  Administered 2012-08-17: 30 mL via ORAL
  Filled 2012-08-17: qty 30

## 2012-08-17 MED ORDER — ONDANSETRON HCL 4 MG PO TABS: 4.0000 mg | ORAL_TABLET | Freq: Four times a day (QID) | ORAL | Status: DC

## 2012-08-17 MED ORDER — PANTOPRAZOLE SODIUM 20 MG PO TBEC: 40.0000 mg | DELAYED_RELEASE_TABLET | Freq: Every day | ORAL | Status: DC

## 2012-08-17 MED ORDER — IOHEXOL 300 MG/ML  SOLN
100.0000 mL | Freq: Once | INTRAMUSCULAR | Status: AC | PRN
  Administered 2012-08-17: 100 mL via INTRAVENOUS

## 2012-08-17 MED ORDER — OXYCODONE-ACETAMINOPHEN 5-325 MG PO TABS: 1.0000 | ORAL_TABLET | Freq: Four times a day (QID) | ORAL | Status: DC | PRN

## 2012-08-17 MED ORDER — IOHEXOL 300 MG/ML  SOLN
50.0000 mL | Freq: Once | INTRAMUSCULAR | Status: AC | PRN
  Administered 2012-08-17: 50 mL via ORAL

## 2012-08-17 MED ORDER — ONDANSETRON HCL 4 MG/2ML IJ SOLN
4.0000 mg | Freq: Once | INTRAMUSCULAR | Status: AC
  Administered 2012-08-17: 4 mg via INTRAVENOUS
  Filled 2012-08-17: qty 2

## 2012-08-17 MED ORDER — HYDROMORPHONE HCL PF 1 MG/ML IJ SOLN
1.0000 mg | Freq: Once | INTRAMUSCULAR | Status: AC
  Administered 2012-08-17: 1 mg via INTRAVENOUS
  Filled 2012-08-17: qty 1

## 2012-08-17 NOTE — ED Notes (Signed)
Pt presents with lower abdominal tenderness associated with nausea that began yesterday.  Pain radiates to lower back with movement.  Pt able to tolerate PO liquids today.  Denies any difficulty with bowel or bladder.

## 2012-08-17 NOTE — ED Notes (Signed)
Pt given PO fluids, tolerating well.  Denies N/V.

## 2012-08-17 NOTE — ED Notes (Signed)
The pt reports that he is having acid reflux  abd pain and nausea. He has run out of his meds

## 2012-08-17 NOTE — ED Provider Notes (Signed)
History     CSN: [id number]  Arrival date & time 08/17/12  2011   First MD Initiated Contact with Patient 08/17/12 2106      Chief Complaint  Patient presents with  . Abdominal Pain    (Consider location/radiation/quality/duration/timing/severity/associated sxs/prior treatment) HPI Pt presents with c/o epigastric pain and nausea.  He states this pain is similar to pain he has had in the past.  He states he had run out of zantac which had been helping in the past.  Denies drinking alcohol.  States pain is constant for the past several days and goes through to his back.  No fever/chills, no change in bowel movements, no dysuria.  States eating makes the pain worse so he has had less appetite lately.  There are no other associated systemic symptoms, there are no other alleviating or modifying factors.   Past Medical History  Diagnosis Date  . Hypertension   . Asthma   . Pancreatitis   . Cocaine abuse   . Depression     Past Surgical History  Procedure Laterality Date  . Cosmetic surgery    . Eye surgery      Family History  Problem Relation Age of Onset  . Hypertension Other   . Coronary artery disease Other     History  Substance Use Topics  . Smoking status: Current Every Day Smoker -- 1.00 packs/day    Types: Cigarettes  . Smokeless tobacco: Current User  . Alcohol Use: Yes     Comment:  last use yesterday (06/23/12)      Review of Systems ROS reviewed and all otherwise negative except for mentioned in HPI  Allergies  Trazodone and nefazodone and Shellfish-derived products  Home Medications   Current Outpatient Rx  Name  Route  Sig  Dispense  Refill  . albuterol (PROVENTIL HFA;VENTOLIN HFA) 108 (90 BASE) MCG/ACT inhaler   Inhalation   Inhale 2 puffs into the lungs every 4 (four) hours as needed for wheezing or shortness of breath. For shortness of breath.         . cloNIDine (CATAPRES) 0.2 MG tablet   Oral   Take 1 tablet (0.2 mg total) by mouth 2  (two) times daily. For blood pressure   60 tablet   1   . Multiple Vitamin (MULTIVITAMIN WITH MINERALS) TABS   Oral   Take 1 tablet by mouth daily.         . ondansetron (ZOFRAN) 8 MG tablet   Oral   Take 1 tablet (8 mg total) by mouth every 8 (eight) hours as needed for nausea.   20 tablet   0   . pantoprazole (PROTONIX) 40 MG tablet   Oral   Take 1 tablet (40 mg total) by mouth daily.   30 tablet   0   . QUEtiapine Fumarate (SEROQUEL XR) 150 MG 24 hr tablet   Oral   Take 150 mg by mouth at bedtime.         . ondansetron (ZOFRAN) 4 MG tablet   Oral   Take 1 tablet (4 mg total) by mouth every 6 (six) hours.   12 tablet   0   . oxyCODONE-acetaminophen (PERCOCET/ROXICET) 5-325 MG per tablet   Oral   Take 1-2 tablets by mouth every 6 (six) hours as needed for pain.   15 tablet   0   . pantoprazole (PROTONIX) 20 MG tablet   Oral   Take 2 tablets (40 mg total) by mouth  daily.   30 tablet   1     BP 150/98  Pulse 79  Temp(Src) 98.1 F (36.7 C) (Oral)  Resp 20  SpO2 100% Vitals reviewed Physical Exam Physical Examination: General appearance - alert, well appearing, and in no distress Mental status - alert, oriented to person, place, and time Eyes - no scleral icterus, no conjunctival injection Mouth - mucous membranes moist, pharynx normal without lesions Chest - clear to auscultation, no wheezes, rales or rhonchi, symmetric air entry Heart - normal rate, regular rhythm, normal S1, S2, no murmurs, rubs, clicks or gallops Abdomen - soft, epigastric tenderness, no gaurding or rebound, nondistended, no masses or organomegaly, nabs Extremities - peripheral pulses normal, no pedal edema, no clubbing or cyanosis Skin - normal coloration and turgor, no rashes  ED Course  Procedures (including critical care time)  Labs Reviewed  COMPREHENSIVE METABOLIC PANEL - Abnormal; Notable for the following:    Sodium 132 (*)    AST 137 (*)    ALT 96 (*)    All other  components within normal limits  LIPASE, BLOOD - Abnormal; Notable for the following:    Lipase 74 (*)    All other components within normal limits  URINALYSIS, ROUTINE W REFLEX MICROSCOPIC  CBC WITH DIFFERENTIAL   Ct Abdomen Pelvis W Contrast  08/17/2012  *RADIOLOGY REPORT*  Clinical Data: Abdominal pain.  Pancreatitis.  Elevated serum lipase.  CT ABDOMEN AND PELVIS WITH CONTRAST  Technique:  Multidetector CT imaging of the abdomen and pelvis was performed following the standard protocol during bolus administration of intravenous contrast.  Contrast: OMNIPAQUE IOHEXOL 300 MG/ML  SOLN  Comparison: 08/18/2010  Findings: The pancreas is normal in appearance.  No evidence of peripancreatic inflammatory changes of fluid collections.  No evidence of pancreatic mass.  The liver, gallbladder, spleen, adrenal glands, and kidneys are normal appearance.  No evidence of hydronephrosis.  No soft tissue masses or lymphadenopathy identified within the abdomen or pelvis.  No evidence of inflammatory process or abnormal fluid collections.  No evidence of bowel wall thickening, dilatation, or hernia.  IMPRESSION: Negative.  No acute findings or other significant abnormality identified.   Original Report Authenticated By: Myles Rosenthal, M.D.      1. Epigastric pain   2. Pancreatitis       MDM  Pt presenting with epigastric pain which is similar to his prior pain.  Lipase is elevated, CT scan does not show any inflammation of the pancreas on CT.  Pt given pain control and rx for percocet.  Discharged with strict return precautions.  Pt agreeable with plan.        Ethelda Chick, MD 08/18/12 [phone]

## 2012-09-19 ENCOUNTER — Emergency Department (HOSPITAL_COMMUNITY)
Admission: EM | Admit: 2012-09-19 | Discharge: 2012-09-19 | Disposition: A | Discharge status: Home / Self Care | Payer: Self-pay | Attending: Emergency Medicine | Admitting: Emergency Medicine

## 2012-09-19 ENCOUNTER — Encounter (HOSPITAL_COMMUNITY): Payer: Self-pay | Admitting: Emergency Medicine

## 2012-09-19 DIAGNOSIS — R112 Nausea with vomiting, unspecified: Secondary | ICD-10-CM | POA: Insufficient documentation

## 2012-09-19 DIAGNOSIS — F1210 Cannabis abuse, uncomplicated: Secondary | ICD-10-CM | POA: Insufficient documentation

## 2012-09-19 DIAGNOSIS — R259 Unspecified abnormal involuntary movements: Secondary | ICD-10-CM | POA: Insufficient documentation

## 2012-09-19 DIAGNOSIS — F3289 Other specified depressive episodes: Secondary | ICD-10-CM | POA: Insufficient documentation

## 2012-09-19 DIAGNOSIS — F329 Major depressive disorder, single episode, unspecified: Secondary | ICD-10-CM | POA: Insufficient documentation

## 2012-09-19 DIAGNOSIS — R1013 Epigastric pain: Secondary | ICD-10-CM | POA: Insufficient documentation

## 2012-09-19 DIAGNOSIS — F17200 Nicotine dependence, unspecified, uncomplicated: Secondary | ICD-10-CM | POA: Insufficient documentation

## 2012-09-19 DIAGNOSIS — R109 Unspecified abdominal pain: Secondary | ICD-10-CM

## 2012-09-19 DIAGNOSIS — J45901 Unspecified asthma with (acute) exacerbation: Secondary | ICD-10-CM | POA: Insufficient documentation

## 2012-09-19 DIAGNOSIS — R197 Diarrhea, unspecified: Secondary | ICD-10-CM | POA: Insufficient documentation

## 2012-09-19 DIAGNOSIS — Z8719 Personal history of other diseases of the digestive system: Secondary | ICD-10-CM | POA: Insufficient documentation

## 2012-09-19 DIAGNOSIS — Z79899 Other long term (current) drug therapy: Secondary | ICD-10-CM | POA: Insufficient documentation

## 2012-09-19 DIAGNOSIS — I10 Essential (primary) hypertension: Secondary | ICD-10-CM | POA: Insufficient documentation

## 2012-09-19 LAB — COMPREHENSIVE METABOLIC PANEL
ALT: 233 U/L — ABNORMAL HIGH (ref 0–53)
Alkaline Phosphatase: 125 U/L — ABNORMAL HIGH (ref 39–117)
CO2: 24 mEq/L (ref 19–32)
Calcium: 9.1 mg/dL (ref 8.4–10.5)
Chloride: 102 mEq/L (ref 96–112)
GFR calc Af Amer: >90 mL/min (ref >90)
GFR calc non Af Amer: >90 mL/min (ref >90)
Glucose, Bld: 95 mg/dL (ref 70–99)
Sodium: 137 mEq/L (ref 135–145)
Total Bilirubin: 0.8 mg/dL (ref 0.3–1.2)

## 2012-09-19 LAB — CBC WITH DIFFERENTIAL/PLATELET
Eosinophils Relative: 1 % (ref 0–5)
HCT: 34.7 % — ABNORMAL LOW (ref 39.0–52.0)
Lymphocytes Relative: 40 % (ref 12–46)
Lymphs Abs: 3.5 10*3/uL (ref 0.7–4.0)
MCV: 94.8 fL (ref 78.0–100.0)
Neutro Abs: 4.5 10*3/uL (ref 1.7–7.7)
Platelets: 154 10*3/uL (ref 150–400)
RBC: 3.66 MIL/uL — ABNORMAL LOW (ref 4.22–5.81)
WBC: 8.8 10*3/uL (ref 4.0–10.5)

## 2012-09-19 MED ORDER — HYDROMORPHONE HCL PF 1 MG/ML IJ SOLN
1.0000 mg | Freq: Once | INTRAMUSCULAR | Status: AC
  Administered 2012-09-19: 1 mg via INTRAVENOUS
  Filled 2012-09-19: qty 1

## 2012-09-19 MED ORDER — GI COCKTAIL ~~LOC~~
30.0000 mL | Freq: Once | ORAL | Status: AC
  Administered 2012-09-19: 30 mL via ORAL
  Filled 2012-09-19: qty 30

## 2012-09-19 MED ORDER — ONDANSETRON HCL 4 MG/2ML IJ SOLN
4.0000 mg | Freq: Once | INTRAMUSCULAR | Status: AC
  Administered 2012-09-19: 4 mg via INTRAVENOUS
  Filled 2012-09-19: qty 2

## 2012-09-19 MED ORDER — SODIUM CHLORIDE 0.9 % IV BOLUS (SEPSIS)
1000.0000 mL | Freq: Once | INTRAVENOUS | Status: AC
  Administered 2012-09-19: 1000 mL via INTRAVENOUS

## 2012-09-19 MED ORDER — MORPHINE SULFATE 4 MG/ML IJ SOLN
4.0000 mg | Freq: Once | INTRAMUSCULAR | Status: AC
  Administered 2012-09-19: 4 mg via INTRAVENOUS
  Filled 2012-09-19: qty 1

## 2012-09-19 NOTE — ED Provider Notes (Signed)
Medical screening examination/treatment/procedure(s) were performed by non-physician practitioner and as supervising physician I was immediately available for consultation/collaboration.   Carleene Cooper III, MD 09/19/12 1900

## 2012-09-19 NOTE — ED Notes (Signed)
Patient was yelling on the phone when RN went into room.

## 2012-09-19 NOTE — ED Notes (Addendum)
Per ems-- pt from home with c/o abdominal pain with hx of pancreatitis. Tremors in legs and arms that started at 0245. Pt took tylenol and antidiarrheal this am. Pt out of pain medication for pancreatitis. No relief with meds.  HR 130 with EMS- reports only drug he has done is marijuana.

## 2012-09-19 NOTE — ED Provider Notes (Signed)
History     CSN: [id number]  Arrival date & time 09/19/12  0612   First MD Initiated Contact with Patient 09/19/12 [phone]      Chief Complaint  Patient presents with  . Abdominal Pain    (Consider location/radiation/quality/duration/timing/severity/associated sxs/prior treatment) HPI Comments: Patient is a 42 year old male with a past medical history of pancreatitis, alcohol abuse and tobacco abuse who presents with abdominal pain that started yesterday. The pain is located in his epigastrium and does not radiate. The pain is described as sharp and severe. The pain started gradually and progressively worsened since the onset. No alleviating/aggravating factors. The patient has tried tylenol and anitdiarrheal for symptoms without relief. Associated symptoms include NVD and extremity "tremors." Patient denies fever, headache, chest pain, SOB, dysuria, constipation. Patient reports cutting down on alcohol significantly in the past few months. Patient was seen 1 month ago with the same symptoms and had an unremarkable workup after a CT abdomen pelvis.      Past Medical History  Diagnosis Date  . Hypertension   . Asthma   . Pancreatitis   . Cocaine abuse   . Depression     Past Surgical History  Procedure Laterality Date  . Cosmetic surgery    . Eye surgery      Family History  Problem Relation Age of Onset  . Hypertension Other   . Coronary artery disease Other     History  Substance Use Topics  . Smoking status: Current Every Day Smoker -- 1.00 packs/day    Types: Cigarettes  . Smokeless tobacco: Current User  . Alcohol Use: Yes     Comment:  last use yesterday (06/23/12)      Review of Systems  Gastrointestinal: Positive for nausea, vomiting, abdominal pain and diarrhea.  All other systems reviewed and are negative.    Allergies  Trazodone and nefazodone and Shellfish-derived products  Home Medications   Current Outpatient Rx  Name  Route  Sig  Dispense   Refill  . albuterol (PROVENTIL HFA;VENTOLIN HFA) 108 (90 BASE) MCG/ACT inhaler   Inhalation   Inhale 2 puffs into the lungs every 4 (four) hours as needed for wheezing or shortness of breath. For shortness of breath.         . cloNIDine (CATAPRES) 0.2 MG tablet   Oral   Take 1 tablet (0.2 mg total) by mouth 2 (two) times daily. For blood pressure   60 tablet   1   . Multiple Vitamin (MULTIVITAMIN WITH MINERALS) TABS   Oral   Take 1 tablet by mouth daily.         . pantoprazole (PROTONIX) 20 MG tablet   Oral   Take 2 tablets (40 mg total) by mouth daily.   30 tablet   1   . QUEtiapine Fumarate (SEROQUEL XR) 150 MG 24 hr tablet   Oral   Take 150 mg by mouth at bedtime.         . traMADol (ULTRAM) 50 MG tablet   Oral   Take 50 mg by mouth every 6 (six) hours as needed for pain.           BP 104/65  Pulse 89  Temp(Src) 97.4 F (36.3 C) (Oral)  Resp 18  SpO2 96%  Physical Exam  Nursing note and vitals reviewed. Constitutional: He is oriented to person, place, and time. He appears well-developed and well-nourished. No distress.  HENT:  Head: Normocephalic and atraumatic.  Eyes: Conjunctivae and EOM  are normal. Pupils are equal, round, and reactive to light. No scleral icterus.  Neck: Normal range of motion.  Cardiovascular: Normal rate and regular rhythm.  Exam reveals no gallop and no friction rub.   No murmur heard. Pulmonary/Chest: Effort normal. He has wheezes. He has no rales. He exhibits no tenderness.  Expiratory wheezes noted bilaterally.   Abdominal: Soft. He exhibits no distension. There is tenderness. There is no rebound and no guarding.  Epigastric tenderness to palpation. No peritoneal signs.   Musculoskeletal: Normal range of motion.  Neurological: He is alert and oriented to person, place, and time. Coordination normal.  Speech is goal-oriented. Moves limbs without ataxia.   Skin: Skin is warm and dry.  Psychiatric: He has a normal mood and  affect. His behavior is normal.    ED Course  Procedures (including critical care time)  Labs Reviewed  CBC WITH DIFFERENTIAL - Abnormal; Notable for the following:    RBC 3.66 (*)    Hemoglobin 12.2 (*)    HCT 34.7 (*)    All other components within normal limits  COMPREHENSIVE METABOLIC PANEL - Abnormal; Notable for the following:    Albumin 3.4 (*)    AST 215 (*)    ALT 233 (*)    Alkaline Phosphatase 125 (*)    All other components within normal limits  LIPASE, BLOOD   No results found.   1. Abdominal pain       MDM  9:00 AM Labs show no acute changes. Vitals stable and patient afebrile. Lipase within normal limits. I do not suspect acute pancreatitis. Patient will have more pain medication and GI cocktail.    Patient feels better and will be discharged with instructions to follow up with his PCP and Gi doctors. Vitals stable and patient afebrile. Patient instructed to return with worsening or concerning symptoms.     Emilia Beck, [place] 09/19/12 [phone]

## 2012-10-02 ENCOUNTER — Other Ambulatory Visit: Payer: Self-pay

## 2012-10-02 ENCOUNTER — Emergency Department (HOSPITAL_COMMUNITY): Payer: Self-pay

## 2012-10-02 ENCOUNTER — Encounter (HOSPITAL_COMMUNITY): Payer: Self-pay | Admitting: *Deleted

## 2012-10-02 ENCOUNTER — Observation Stay (HOSPITAL_COMMUNITY)
Admission: EM | Admit: 2012-10-02 | Discharge: 2012-10-03 | Disposition: A | Discharge status: Home / Self Care | Payer: MEDICAID | Attending: Family Medicine | Admitting: Family Medicine

## 2012-10-02 DIAGNOSIS — F1910 Other psychoactive substance abuse, uncomplicated: Secondary | ICD-10-CM | POA: Diagnosis present

## 2012-10-02 DIAGNOSIS — I456 Pre-excitation syndrome: Secondary | ICD-10-CM | POA: Diagnosis present

## 2012-10-02 DIAGNOSIS — R079 Chest pain, unspecified: Secondary | ICD-10-CM | POA: Insufficient documentation

## 2012-10-02 DIAGNOSIS — F1010 Alcohol abuse, uncomplicated: Secondary | ICD-10-CM | POA: Diagnosis present

## 2012-10-02 DIAGNOSIS — I10 Essential (primary) hypertension: Secondary | ICD-10-CM | POA: Insufficient documentation

## 2012-10-02 DIAGNOSIS — W1809XA Striking against other object with subsequent fall, initial encounter: Secondary | ICD-10-CM | POA: Insufficient documentation

## 2012-10-02 DIAGNOSIS — Y92009 Unspecified place in unspecified non-institutional (private) residence as the place of occurrence of the external cause: Secondary | ICD-10-CM | POA: Insufficient documentation

## 2012-10-02 DIAGNOSIS — F1210 Cannabis abuse, uncomplicated: Secondary | ICD-10-CM | POA: Insufficient documentation

## 2012-10-02 DIAGNOSIS — R55 Syncope and collapse: Principal | ICD-10-CM | POA: Insufficient documentation

## 2012-10-02 LAB — BASIC METABOLIC PANEL
BUN: 7 mg/dL (ref 6–23)
CO2: 21 mEq/L (ref 19–32)
Calcium: 9.5 mg/dL (ref 8.4–10.5)
Chloride: 103 mEq/L (ref 96–112)
Creatinine, Ser: 0.65 mg/dL (ref 0.50–1.35)

## 2012-10-02 LAB — CBC
HCT: 32.3 % — ABNORMAL LOW (ref 39.0–52.0)
MCH: 33.6 pg (ref 26.0–34.0)
MCV: 97 fL (ref 78.0–100.0)
RBC: 3.33 MIL/uL — ABNORMAL LOW (ref 4.22–5.81)
WBC: 7.4 10*3/uL (ref 4.0–10.5)

## 2012-10-02 MED ORDER — MORPHINE SULFATE 4 MG/ML IJ SOLN
4.0000 mg | Freq: Once | INTRAMUSCULAR | Status: AC
  Administered 2012-10-02: 4 mg via INTRAVENOUS
  Filled 2012-10-02: qty 1

## 2012-10-02 MED ORDER — ONDANSETRON HCL 4 MG/2ML IJ SOLN
4.0000 mg | Freq: Once | INTRAMUSCULAR | Status: AC
Start: 2012-10-02 — End: 2012-10-02
  Administered 2012-10-02: 4 mg via INTRAVENOUS
  Filled 2012-10-02: qty 2

## 2012-10-02 NOTE — ED Provider Notes (Signed)
History     CSN: [id number]  Arrival date & time 10/02/12  2051   First MD Initiated Contact with Patient 10/02/12 2237      Chief Complaint  Patient presents with  . Loss of Consciousness  . Dizziness  . rib pain     (Consider location/radiation/quality/duration/timing/severity/associated sxs/prior treatment) HPI Comments: Patient presents after syncopal episode without prodrome. States he was walking his bedroom and all of a sudden he fell to floor without warning. Did not hit his head he does not remember if he lost consciousness. He his ribs on the dresser. Denies any prodrome. No chest pain, shortness of breath, nausea, vomiting or palpitations. Denies any vertigo lightheadedness. Denies any previous history of syncope. Denies any recent cocaine abuse but has used in the past. History of pancreatitis, asthma, hypertension. Denies any leg pain or swelling. Good by mouth intake and urine output today.  The history is provided by the patient.    Past Medical History  Diagnosis Date  . Hypertension   . Asthma   . Pancreatitis   . Cocaine abuse   . Depression     Past Surgical History  Procedure Laterality Date  . Cosmetic surgery    . Eye surgery      Family History  Problem Relation Age of Onset  . Hypertension Other   . Coronary artery disease Other     History  Substance Use Topics  . Smoking status: Current Every Day Smoker -- 1.00 packs/day    Types: Cigarettes  . Smokeless tobacco: Current User  . Alcohol Use: Yes     Comment:  last use yesterday (06/23/12)      Review of Systems  Constitutional: Negative for activity change, appetite change and fatigue.  HENT: Negative for congestion and rhinorrhea.   Respiratory: Positive for chest tightness. Negative for cough and shortness of breath.   Cardiovascular: Positive for chest pain.  Gastrointestinal: Negative for nausea, vomiting and abdominal pain.  Genitourinary: Negative for dysuria and hematuria.   Musculoskeletal: Negative for back pain.  Neurological: Positive for seizures. Negative for dizziness and light-headedness.  A complete 10 system review of systems was obtained and all systems are negative except as noted in the HPI and PMH.    Allergies  Trazodone and nefazodone and Shellfish-derived products  Home Medications   Current Outpatient Rx  Name  Route  Sig  Dispense  Refill  . albuterol (PROVENTIL HFA;VENTOLIN HFA) 108 (90 BASE) MCG/ACT inhaler   Inhalation   Inhale 2 puffs into the lungs every 4 (four) hours as needed for wheezing or shortness of breath. For shortness of breath.         . cloNIDine (CATAPRES) 0.2 MG tablet   Oral   Take 1 tablet (0.2 mg total) by mouth 2 (two) times daily. For blood pressure   60 tablet   1   . Multiple Vitamin (MULTIVITAMIN WITH MINERALS) TABS   Oral   Take 1 tablet by mouth daily.         . pantoprazole (PROTONIX) 20 MG tablet   Oral   Take 2 tablets (40 mg total) by mouth daily.   30 tablet   1   . QUEtiapine Fumarate (SEROQUEL XR) 150 MG 24 hr tablet   Oral   Take 150 mg by mouth at bedtime.         . traMADol (ULTRAM) 50 MG tablet   Oral   Take 50 mg by mouth every 6 (six) hours as  needed for pain.           BP 154/98  Pulse 88  Temp(Src) 98.7 F (37.1 C) (Oral)  Resp 23  SpO2 96%  Physical Exam  Constitutional: He is oriented to person, place, and time. He appears well-developed and well-nourished. No distress.  HENT:  Head: Normocephalic and atraumatic.  Mouth/Throat: Oropharynx is clear and moist. No oropharyngeal exudate.  Eyes: Conjunctivae and EOM are normal. Pupils are equal, round, and reactive to light.  Neck: Normal range of motion. Neck supple.  No C spine pain, stepoff or deformity  Cardiovascular: Normal rate, regular rhythm and normal heart sounds.   No murmur heard. Pulmonary/Chest: Effort normal. No respiratory distress. He exhibits tenderness.  R rib tenderness. No crepitance or  ecchymosis  Abdominal: Soft. There is no tenderness. There is no rebound and no guarding.  Musculoskeletal: Normal range of motion. He exhibits no edema and no tenderness.  Neurological: He is alert and oriented to person, place, and time. No cranial nerve deficit. He exhibits normal muscle tone. Coordination normal.  CN 2-12 intact, no ataxia on finger to nose, no nystagmus, 5/5 strength throughout, no pronator drift, Romberg negative, normal gait.   Skin: Skin is warm.    ED Course  Procedures (including critical care time)  Labs Reviewed  CBC - Abnormal; Notable for the following:    RBC 3.33 (*)    Hemoglobin 11.2 (*)    HCT 32.3 (*)    All other components within normal limits  URINE RAPID DRUG SCREEN (HOSP PERFORMED) - Abnormal; Notable for the following:    Tetrahydrocannabinol POSITIVE (*)    All other components within normal limits  BASIC METABOLIC PANEL  GLUCOSE, CAPILLARY  TROPONIN I  POCT I-STAT TROPONIN I   Dg Ribs Unilateral W/chest Right  10/02/2012   *RADIOLOGY REPORT*  Clinical Data: Loss of consciousness  RIGHT RIBS AND CHEST - 3+ VIEW  Comparison: Chest radiograph 10/27/2011  Findings: Normal mediastinum and heart silhouette.  Costophrenic angles are clear.  No effusion, infiltrate, or pneumothorax.  There is a rib marker in the right anterior chest wall.  No associated rib fracture.  IMPRESSION:  1. No acute cardiopulmonary process. 2.  No evidence of rib fracture.   Original Report Authenticated By: Genevive Bi, M.D.   Ct Head Wo Contrast  10/03/2012   *RADIOLOGY REPORT*  Clinical Data: Syncope.  CT HEAD WITHOUT CONTRAST  Technique:  Contiguous axial images were obtained from the base of the skull through the vertex without contrast.  Comparison: CT of the head performed 02/16/2012  Findings: There is no evidence of acute infarction, mass lesion, or intra- or extra-axial hemorrhage on CT.  The posterior fossa, including the cerebellum, brainstem and fourth  ventricle, is within normal limits.  The third and lateral ventricles, and basal ganglia are unremarkable in appearance.  The cerebral hemispheres are symmetric in appearance, with normal gray- white differentiation.  No mass effect or midline shift is seen.  There is no evidence of acute fracture; there is chronic deformity of the medial wall of the left orbit, reflecting remote blowout fracture.  Postoperative calvarial thinning is noted on the right side near the vertex.  The visualized portions of the orbits are within normal limits.  The paranasal sinuses and mastoid air cells are well-aerated.  No significant soft tissue abnormalities are seen.  IMPRESSION: No acute intracranial pathology seen on CT.  Postoperative and post- traumatic changes again noted.   Original Report Authenticated By: Tinnie Gens  Cherly Hensen, M.D.     1. Syncope   2. WPW (Wolff-Parkinson-White syndrome)   3. Alcohol abuse, unspecified   4. Syncope and collapse   5. [name] Parkinson White pattern seen on electrocardiogram       MDM  Syncopal episode without prodrome. No chest pain or shortness of breath. Hit right ribs on her dresser. Denies any dizziness, lightheadedness, vertigo, nausea or vomiting. Denies any recent cocaine use  Patient has T wave inversions inferolaterally which are new and concerning for ischemia especially in setting of syncope. Denies chest pain complains of pain over the right ribs. No pneumothorax or rib fracture x-ray. There is no fluid collection at bedside ultrasound.  EKG with short PR interval and possible delta wave concerning for WPW. Will need admission to rule out cardiogenic cause of syncope.   Date: 10/02/2012  Rate: 85  Rhythm: normal sinus rhythm  QRS Axis: normal  Intervals: normal  ST/T Wave abnormalities: nonspecific ST/T changes  Conduction Disutrbances:none  Narrative Interpretation: T wave inversions inferior laterally that are new with slight ST depression. Short PR, delta  waves  Old EKG Reviewed: changes noted  EMERGENCY DEPARTMENT [place] FAST EXAM  INDICATIONS:Blunt trauma to the Thorax  PERFORMED BY: Myself  IMAGES ARCHIVED?: No  FINDINGS: All views negative  LIMITATIONS:  Body habitus  INTERPRETATION:  No abdominal free fluid and No pericardial effusion  COMMENT:      Glynn Octave, MD 10/03/12 [phone]

## 2012-10-02 NOTE — ED Notes (Signed)
Pt was standing and had syncopal episode and wife said it happened twice and did not lose consciousness, but patient does not remember.  Pt hit right ribs into dresser corner.  Pt denies dizziness now.  Pt has pain with deep breath and movement

## 2012-10-03 ENCOUNTER — Encounter: Payer: Self-pay | Admitting: Family Medicine

## 2012-10-03 ENCOUNTER — Encounter (HOSPITAL_COMMUNITY): Payer: Self-pay | Admitting: Anesthesiology

## 2012-10-03 ENCOUNTER — Observation Stay (HOSPITAL_COMMUNITY): Payer: Self-pay

## 2012-10-03 DIAGNOSIS — R55 Syncope and collapse: Secondary | ICD-10-CM

## 2012-10-03 DIAGNOSIS — I456 Pre-excitation syndrome: Secondary | ICD-10-CM | POA: Diagnosis present

## 2012-10-03 LAB — RAPID URINE DRUG SCREEN, HOSP PERFORMED
Amphetamines: NOT DETECTED
Benzodiazepines: NOT DETECTED
Cocaine: NOT DETECTED
Opiates: NOT DETECTED
Tetrahydrocannabinol: POSITIVE — AB

## 2012-10-03 LAB — TROPONIN I: Troponin I: <0.3 ng/mL (ref <0.30)

## 2012-10-03 MED ORDER — CHLORHEXIDINE GLUCONATE 0.12 % MT SOLN
15.0000 mL | Freq: Two times a day (BID) | OROMUCOSAL | Status: DC
  Administered 2012-10-03: 15 mL via OROMUCOSAL
  Filled 2012-10-03 (×3): qty 15

## 2012-10-03 MED ORDER — QUETIAPINE FUMARATE ER 50 MG PO TB24
150.0000 mg | ORAL_TABLET | Freq: Every day | ORAL | Status: DC
  Administered 2012-10-03: 150 mg via ORAL
  Filled 2012-10-03 (×2): qty 3

## 2012-10-03 MED ORDER — TECHNETIUM TC 99M SESTAMIBI GENERIC - CARDIOLITE
10.0000 | Freq: Once | INTRAVENOUS | Status: AC | PRN
  Administered 2012-10-03: 10 via INTRAVENOUS

## 2012-10-03 MED ORDER — ALBUTEROL SULFATE HFA 108 (90 BASE) MCG/ACT IN AERS: 2.0000 | INHALATION_SPRAY | RESPIRATORY_TRACT | Status: DC | PRN

## 2012-10-03 MED ORDER — CLONIDINE HCL 0.2 MG PO TABS
0.2000 mg | ORAL_TABLET | Freq: Two times a day (BID) | ORAL | Status: DC
  Administered 2012-10-03 (×2): 0.2 mg via ORAL
  Filled 2012-10-03 (×3): qty 1

## 2012-10-03 MED ORDER — ADULT MULTIVITAMIN W/MINERALS CH
1.0000 | ORAL_TABLET | Freq: Every day | ORAL | Status: DC
  Administered 2012-10-03: 1 via ORAL
  Filled 2012-10-03: qty 1

## 2012-10-03 MED ORDER — HEPARIN SODIUM (PORCINE) 5000 UNIT/ML IJ SOLN
5000.0000 [IU] | Freq: Three times a day (TID) | INTRAMUSCULAR | Status: DC
  Filled 2012-10-03 (×4): qty 1

## 2012-10-03 MED ORDER — TRAMADOL HCL 50 MG PO TABS
50.0000 mg | ORAL_TABLET | Freq: Four times a day (QID) | ORAL | Status: DC | PRN
  Administered 2012-10-03 (×2): 50 mg via ORAL
  Filled 2012-10-03 (×2): qty 1

## 2012-10-03 MED ORDER — OXYCODONE-ACETAMINOPHEN 5-325 MG PO TABS
2.0000 | ORAL_TABLET | Freq: Once | ORAL | Status: AC
  Administered 2012-10-03: 2 via ORAL
  Filled 2012-10-03: qty 2

## 2012-10-03 MED ORDER — REGADENOSON 0.4 MG/5ML IV SOLN
0.4000 mg | Freq: Once | INTRAVENOUS | Status: AC
  Administered 2012-10-03: 0.4 mg via INTRAVENOUS
  Filled 2012-10-03: qty 5

## 2012-10-03 MED ORDER — BIOTENE DRY MOUTH MT LIQD
15.0000 mL | Freq: Two times a day (BID) | OROMUCOSAL | Status: DC
  Administered 2012-10-03: 15 mL via OROMUCOSAL

## 2012-10-03 MED ORDER — OXYCODONE HCL 5 MG PO TABS: 5.0000 mg | ORAL_TABLET | ORAL | Status: DC | PRN

## 2012-10-03 MED ORDER — REGADENOSON 0.4 MG/5ML IV SOLN
INTRAVENOUS | Status: AC
  Filled 2012-10-03: qty 5

## 2012-10-03 MED ORDER — SODIUM CHLORIDE 0.9 % IJ SOLN
3.0000 mL | Freq: Two times a day (BID) | INTRAMUSCULAR | Status: DC
  Administered 2012-10-03 (×2): 3 mL via INTRAVENOUS

## 2012-10-03 MED ORDER — OXYCODONE HCL 5 MG PO TABS
5.0000 mg | ORAL_TABLET | Freq: Once | ORAL | Status: AC
  Administered 2012-10-03: 5 mg via ORAL
  Filled 2012-10-03: qty 1

## 2012-10-03 MED ORDER — TECHNETIUM TC 99M SESTAMIBI GENERIC - CARDIOLITE
30.0000 | Freq: Once | INTRAVENOUS | Status: AC | PRN
  Administered 2012-10-03: 30 via INTRAVENOUS

## 2012-10-03 MED ORDER — LORAZEPAM 1 MG PO TABS
1.0000 mg | ORAL_TABLET | Freq: Once | ORAL | Status: AC
  Administered 2012-10-03: 1 mg via ORAL
  Filled 2012-10-03: qty 1

## 2012-10-03 MED ORDER — PANTOPRAZOLE SODIUM 40 MG PO TBEC
40.0000 mg | DELAYED_RELEASE_TABLET | Freq: Every day | ORAL | Status: DC
  Administered 2012-10-03: 40 mg via ORAL
  Filled 2012-10-03: qty 1

## 2012-10-03 NOTE — Progress Notes (Signed)
  Echocardiogram 2D Echocardiogram has been performed.  [name] 10/03/2012, 2:34 PM

## 2012-10-03 NOTE — Consult Note (Signed)
[name] is an 42 y.o. male.   Chief Complaint: Syncope and abnormal EKG. HPI: 42 years old male with ischemia and short PR on EKG was admitted for single episode of syncope. Also c/o of right sided rib pain from fall.  Past Medical History  Diagnosis Date  . Hypertension   . Asthma   . Pancreatitis   . Cocaine abuse   . Depression       Past Surgical History  Procedure Laterality Date  . Cosmetic surgery    . Eye surgery      Family History  Problem Relation Age of Onset  . Hypertension Other   . Coronary artery disease Other    Social History:  reports that he has been smoking Cigarettes.  He has been smoking about 1.00 pack per day. He uses smokeless tobacco. He reports that  drinks alcohol. He reports that he uses illicit drugs (Marijuana).  Allergies:  Allergies  Allergen Reactions  . Trazodone And Nefazodone     Muscle spasms  . Shellfish-Derived Products Nausea And Vomiting    Medications Prior to Admission  Medication Sig Dispense Refill  . albuterol (PROVENTIL HFA;VENTOLIN HFA) 108 (90 BASE) MCG/ACT inhaler Inhale 2 puffs into the lungs every 4 (four) hours as needed for wheezing or shortness of breath. For shortness of breath.      . cloNIDine (CATAPRES) 0.2 MG tablet Take 1 tablet (0.2 mg total) by mouth 2 (two) times daily. For blood pressure  60 tablet  1  . Multiple Vitamin (MULTIVITAMIN WITH MINERALS) TABS Take 1 tablet by mouth daily.      . pantoprazole (PROTONIX) 20 MG tablet Take 2 tablets (40 mg total) by mouth daily.  30 tablet  1  . QUEtiapine Fumarate (SEROQUEL XR) 150 MG 24 hr tablet Take 150 mg by mouth at bedtime.      . traMADol (ULTRAM) 50 MG tablet Take 50 mg by mouth every 6 (six) hours as needed for pain.        Results for orders placed during the hospital encounter of 10/02/12 (from the past 48 hour(s))  CBC     Status: Abnormal   Collection Time    10/02/12  9:10 PM      Result Value Range   WBC 7.4  4.0 - 10.5 K/uL   RBC  3.33 (*) 4.22 - 5.81 MIL/uL   Hemoglobin 11.2 (*) 13.0 - 17.0 g/dL   HCT 16.1 (*) 09.6 - 04.5 %   MCV 97.0  78.0 - 100.0 fL   MCH 33.6  26.0 - 34.0 pg   MCHC 34.7  30.0 - 36.0 g/dL   RDW 40.9  81.1 - 91.4 %   Platelets 228  150 - 400 K/uL  BASIC METABOLIC PANEL     Status: None   Collection Time    10/02/12  9:10 PM      Result Value Range   Sodium 139  135 - 145 mEq/L   Potassium 3.6  3.5 - 5.1 mEq/L   Chloride 103  96 - 112 mEq/L   CO2 21  19 - 32 mEq/L   Glucose, Bld 74  70 - 99 mg/dL   BUN 7  6 - 23 mg/dL   Creatinine, Ser 7.82  0.50 - 1.35 mg/dL   Calcium 9.5  8.4 - 95.6 mg/dL   GFR calc non Af Amer >90  >90 mL/min   GFR calc Af Amer >90  >90 mL/min   Comment:  The eGFR has been calculated     using the CKD EPI equation.     This calculation has not been     validated in all clinical     situations.     eGFR's persistently     <90 mL/min signify     possible Chronic Kidney Disease.  GLUCOSE, CAPILLARY     Status: None   Collection Time    10/02/12  9:14 PM      Result Value Range   Glucose-Capillary 83  70 - 99 mg/dL  POCT I-STAT TROPONIN I     Status: None   Collection Time    10/02/12  9:20 PM      Result Value Range   Troponin i, poc 0.01  0.00 - 0.08 ng/mL   Comment 3            Comment: Due to the release kinetics of cTnI,     a negative result within the first hours     of the onset of symptoms does not rule out     myocardial infarction with certainty.     If myocardial infarction is still suspected,     repeat the test at appropriate intervals.  URINE RAPID DRUG SCREEN (HOSP PERFORMED)     Status: Abnormal   Collection Time    10/02/12 11:52 PM      Result Value Range   Opiates NONE DETECTED  NONE DETECTED   Cocaine NONE DETECTED  NONE DETECTED   Benzodiazepines NONE DETECTED  NONE DETECTED   Amphetamines NONE DETECTED  NONE DETECTED   Tetrahydrocannabinol POSITIVE (*) NONE DETECTED   Barbiturates NONE DETECTED  NONE DETECTED   Comment:             DRUG SCREEN FOR MEDICAL PURPOSES     ONLY.  IF CONFIRMATION IS NEEDED     FOR ANY PURPOSE, NOTIFY LAB     WITHIN 5 DAYS.                LOWEST DETECTABLE LIMITS     FOR URINE DRUG SCREEN     Drug Class       Cutoff (ng/mL)     Amphetamine      1000     Barbiturate      200     Benzodiazepine   200     Tricyclics       300     Opiates          300     Cocaine          300     THC              50  TROPONIN I     Status: None   Collection Time    10/03/12  1:04 AM      Result Value Range   Troponin I <0.30  <0.30 ng/mL   Comment:            Due to the release kinetics of cTnI,     a negative result within the first hours     of the onset of symptoms does not rule out     myocardial infarction with certainty.     If myocardial infarction is still suspected,     repeat the test at appropriate intervals.   Dg Ribs Unilateral W/chest Right  10/02/2012   *RADIOLOGY REPORT*  Clinical Data: Loss of consciousness  RIGHT RIBS AND CHEST - 3+  VIEW  Comparison: Chest radiograph 10/27/2011  Findings: Normal mediastinum and heart silhouette.  Costophrenic angles are clear.  No effusion, infiltrate, or pneumothorax.  There is a rib marker in the right anterior chest wall.  No associated rib fracture.  IMPRESSION:  1. No acute cardiopulmonary process. 2.  No evidence of rib fracture.   Original Report Authenticated By: Genevive Bi, M.D.   Ct Head Wo Contrast  10/03/2012   *RADIOLOGY REPORT*  Clinical Data: Syncope.  CT HEAD WITHOUT CONTRAST  Technique:  Contiguous axial images were obtained from the base of the skull through the vertex without contrast.  Comparison: CT of the head performed 02/16/2012  Findings: There is no evidence of acute infarction, mass lesion, or intra- or extra-axial hemorrhage on CT.  The posterior fossa, including the cerebellum, brainstem and fourth ventricle, is within normal limits.  The third and lateral ventricles, and basal ganglia are unremarkable in  appearance.  The cerebral hemispheres are symmetric in appearance, with normal gray- white differentiation.  No mass effect or midline shift is seen.  There is no evidence of acute fracture; there is chronic deformity of the medial wall of the left orbit, reflecting remote blowout fracture.  Postoperative calvarial thinning is noted on the right side near the vertex.  The visualized portions of the orbits are within normal limits.  The paranasal sinuses and mastoid air cells are well-aerated.  No significant soft tissue abnormalities are seen.  IMPRESSION: No acute intracranial pathology seen on CT.  Postoperative and post- traumatic changes again noted.   Original Report Authenticated By: Tonia Ghent, M.D.    @ROS @ + HTN, No hearing loss, + COPD, No palpitation, + chest/rib pain, No nausea, vomiting or diarrhea. No hepatitis, No GI bleed, No stroke, no seizures, no psych admission.  Blood pressure 154/99, pulse 87, temperature 98 F (36.7 C), temperature source Oral, resp. rate 18, height 5\' 9"  (1.753 m), weight 59.739 kg (131 lb 11.2 oz), SpO2 99.00%.  HEENT: [place]/AT, Brown eyes, Anicteric. No facial asymmetry. Tongue is midline. Neck:-No JVD.  CHEST: Bilateral air entry present. No rhonchi. No crepitation.  HEART: S1 and S2 heard. No murmur. ABDOMEN: Soft, mild tenderness in the epigastric area. No guarding. No rigidity.  CNS: Awake, alert, and oriented to time, place, and person. Moves upper and lower extremities, 5/5.  EXTREMITIES: No edema, no cyanosis. + clubbing. Skin: Warm and dry.  Assessment/Plan Syncope Possible CAD Rib pain from fall Hypertension Alcohol and substance use disorder  Nuclear stress test today.  Umair Rosiles S 10/03/2012, 8:54 AM

## 2012-10-03 NOTE — H&P (Signed)
[facility] History and Physical  [name]:[id number] DOB: April 27, 1971 DOA: 10/02/2012  Referring physician: ED PCP: No PCP Per Patient  Specialists: None  Chief Complaint: Syncope  HPI: [name] is a 42 y.o. male who presents with a single episode of syncope while walking in his bedroom earlier this evening.  He states he fell to the floor, did not hit his head, but does not remember if he had LOC.  Onset with sudden and without any warning or prodrome.  States he hit his ribs on the dresser.  No recent cocaine abuse but has used in the past, does have history of pancreatitis (from EtOH abuse), asthma, and HTN.  No history of previous syncopal episodes ever in his life.  Work up in the ED was remarkable for an EKG showing T wave inversions in lateral leads that were not present in December.  His EKG is also suspicious on my review (confirmed with my partner) for  WPW pattern.  Hospitalist has been asked to admit.  Review of Systems: 12 systems reviewed and otherwise negative.  Past Medical History  Diagnosis Date  . Hypertension   . Asthma   . Pancreatitis   . Cocaine abuse   . Depression    Past Surgical History  Procedure Laterality Date  . Cosmetic surgery    . Eye surgery     Social History:  reports that he has been smoking Cigarettes.  He has been smoking about 1.00 pack per day. He uses smokeless tobacco. He reports that  drinks alcohol. He reports that he uses illicit drugs (Marijuana).   Allergies  Allergen Reactions  . Trazodone And Nefazodone     Muscle spasms  . Shellfish-Derived Products Nausea And Vomiting    Family History  Problem Relation Age of Onset  . Hypertension Other   . Coronary artery disease Other    Prior to Admission medications   Medication Sig Start Date End Date Taking? Authorizing Provider  albuterol (PROVENTIL HFA;VENTOLIN HFA) 108 (90 BASE) MCG/ACT inhaler Inhale 2 puffs into the lungs every 4 (four) hours as  needed for wheezing or shortness of breath. For shortness of breath. 07/11/11  Yes Felisa Bonier, MD  cloNIDine (CATAPRES) 0.2 MG tablet Take 1 tablet (0.2 mg total) by mouth 2 (two) times daily. For blood pressure 10/04/11  Yes Viviann Spare, FNP  Multiple Vitamin (MULTIVITAMIN WITH MINERALS) TABS Take 1 tablet by mouth daily.   Yes Historical Provider, MD  pantoprazole (PROTONIX) 20 MG tablet Take 2 tablets (40 mg total) by mouth daily. 08/17/12  Yes Ethelda Chick, MD  QUEtiapine Fumarate (SEROQUEL XR) 150 MG 24 hr tablet Take 150 mg by mouth at bedtime.   Yes Historical Provider, MD  traMADol (ULTRAM) 50 MG tablet Take 50 mg by mouth every 6 (six) hours as needed for pain.   Yes Historical Provider, MD   Physical Exam: Filed Vitals:   10/02/12 2230 10/02/12 2330 10/03/12 0000 10/03/12 0030  BP: 143/89 134/73 149/87 154/98  Pulse: 82 96 91 88  Temp:      TempSrc:      Resp: 21 18 19 23   SpO2: 100% 98% 98% 96%    General:  NAD, resting comfortably in bed Eyes: PEERLA EOMI ENT: mucous membranes moist Neck: supple w/o JVD Cardiovascular: RRR w/o MRG Respiratory: CTA B Abdomen: soft, nt, nd, bs+ Skin: no rash nor lesion Musculoskeletal: MAE, full ROM all 4 extremities Psychiatric: normal tone and affect Neurologic:  AAOx3, grossly non-focal  Labs on Admission:  Basic Metabolic Panel:  Recent Labs Lab 10/02/12 2110  NA 139  K 3.6  CL 103  CO2 21  GLUCOSE 74  BUN 7  CREATININE 0.65  CALCIUM 9.5   Liver Function Tests: No results found for this basename: AST, ALT, ALKPHOS, BILITOT, PROT, ALBUMIN,  in the last 168 hours No results found for this basename: LIPASE, AMYLASE,  in the last 168 hours No results found for this basename: AMMONIA,  in the last 168 hours CBC:  Recent Labs Lab 10/02/12 2110  WBC 7.4  HGB 11.2*  HCT 32.3*  MCV 97.0  PLT 228   Cardiac Enzymes: No results found for this basename: CKTOTAL, CKMB, CKMBINDEX, TROPONINI,  in the last 168  hours  BNP (last 3 results) No results found for this basename: PROBNP,  in the last 8760 hours CBG:  Recent Labs Lab 10/02/12 2114  GLUCAP 83    Radiological Exams on Admission: Dg Ribs Unilateral W/chest Right  10/02/2012   *RADIOLOGY REPORT*  Clinical Data: Loss of consciousness  RIGHT RIBS AND CHEST - 3+ VIEW  Comparison: Chest radiograph 10/27/2011  Findings: Normal mediastinum and heart silhouette.  Costophrenic angles are clear.  No effusion, infiltrate, or pneumothorax.  There is a rib marker in the right anterior chest wall.  No associated rib fracture.  IMPRESSION:  1. No acute cardiopulmonary process. 2.  No evidence of rib fracture.   Original Report Authenticated By: Genevive Bi, M.D.   Ct Head Wo Contrast  10/03/2012   *RADIOLOGY REPORT*  Clinical Data: Syncope.  CT HEAD WITHOUT CONTRAST  Technique:  Contiguous axial images were obtained from the base of the skull through the vertex without contrast.  Comparison: CT of the head performed 02/16/2012  Findings: There is no evidence of acute infarction, mass lesion, or intra- or extra-axial hemorrhage on CT.  The posterior fossa, including the cerebellum, brainstem and fourth ventricle, is within normal limits.  The third and lateral ventricles, and basal ganglia are unremarkable in appearance.  The cerebral hemispheres are symmetric in appearance, with normal gray- white differentiation.  No mass effect or midline shift is seen.  There is no evidence of acute fracture; there is chronic deformity of the medial wall of the left orbit, reflecting remote blowout fracture.  Postoperative calvarial thinning is noted on the right side near the vertex.  The visualized portions of the orbits are within normal limits.  The paranasal sinuses and mastoid air cells are well-aerated.  No significant soft tissue abnormalities are seen.  IMPRESSION: No acute intracranial pathology seen on CT.  Postoperative and post- traumatic changes again noted.    Original Report Authenticated By: Tonia Ghent, M.D.    EKG: Independently reviewed. Appears to show a short PR of 106 ms and questionable delta waves in lead V2.  On review of his ekg on 12/26 the delta waves are more apparent on this previous EKG (also has a short PR interval there as well).  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  The patient also has T wave inversions in lateral leads which do appear to be more apparent on his EKG today than previously.  Assessment/Plan Principal Problem:   Syncope and collapse Active Problems:   ALCOHOL ABUSE   SUBSTANCE ABUSE, MULTIPLE   [name] Parkinson White pattern seen on electrocardiogram   1. Syncope and collapse - likely cardiogenic: DDX includes 1. WPW syndrome - given the WPW pattern on EKG  this could have caused a symptomatic arrythmia causing his syncope.  Likely needs cardiology and possibly ECP consultation in AM regarding this.  Patient on tele monitor. 2. Ischemic - possible but less likely given that he hasnt done cocaine recently he states and his UDS only shows THC.  He does however have T wave inversions in the lateral leads, ordering serial troponin's. 2. EtOH abuse - watch for signs and symptoms of withdrawal. 3. Substance abuse - patient again counciled on the need to quit cocaine.    Code Status: Full Code (must indicate code status--if unknown or must be presumed, indicate so) Family Communication: spoke with family at bedside (indicate person spoken with, if applicable, with phone number if by telephone) Disposition Plan: Admit to obs (indicate anticipated LOS)  Time spent: 70 min  GARDNER, JARED M. [facility] Pager [phone]  If 7PM-7AM, please contact night-coverage [email or web address] Password [facility] 10/03/2012, 12:56 AM

## 2012-10-03 NOTE — ED Notes (Signed)
Report receieved, 1st contact with pt and will assume care of pt at this time

## 2012-10-03 NOTE — Progress Notes (Signed)
CSW received referral today for finding a primary doctor in the community. RNCM made aware of this and will followiup. No CSW needs identified at this time will sign off please re-consult if social work needs arise.  Sherald Barge, LCSW-A Clinical Social Worker [phone]

## 2012-10-03 NOTE — Progress Notes (Signed)
DC orders received.  Patient stable with no S/S of distress.  Medication and discharge information reviewed with patient.  Patient DC home. Saeed Toren Marie  

## 2012-10-03 NOTE — Progress Notes (Signed)
Physician Discharge Summary  [name] ZOX:[id number] DOB: 09/16/1970 DOA: 10/02/2012  PCP: No PCP Per Patient  Admit date: 10/02/2012 Discharge date: 10/03/2012  Time spent: 35 minutes  Recommendations for Outpatient Follow-up:  1. Patient has been encouraged to find a primary care physician 2. Patient has been encouraged to cyst from alcohol cannabis use. 3. Patient stated he had significant back pain after fall-patient's given 6 tablets of Percocet/oxycodone on discharge with no refills   Discharge Diagnoses:  Principal Problem:   Syncope and collapse Active Problems:   ALCOHOL ABUSE   SUBSTANCE ABUSE, MULTIPLE   [name] Parkinson White pattern seen on electrocardiogram   Discharge Condition: Good  Diet recommendation: Heart healthy low-salt  Filed Weights   10/03/12 0230  Weight: 59.739 kg (131 lb 11.2 oz)    History of present illness:  42 year old African American male with known history of pancreatitis secondary to alcohol abuse fatty liver, cannabis abuse and multiple ED visits for the same presented early in the morning of 09/25/2012. He hit his ribs on the dresser and was noted to have potential Wolff-Parkinson-White pattern on EKG with T wave inversions Because of this cardiology was consulted and excess stress test was performed showing equal focal findings. Patient was cleared by cardiologist Dr.Kadakia for discharge home-serial troponins were negative. I discussed case with Dr. Leron Croak after stress test results and he stated that patient followup in the outpatient setting for an echocardiogram. 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   Procedures:  CT head  Chest x-ray  Stress test = 09/25/12 = EF 67%, no fixed or reversible deficit, normal left ventricular wall, normal ejection  Consultations:  Cardiology Dr. Algie Coffer  Discharge  Exam: Filed Vitals:   10/03/12 1301 10/03/12 1303 10/03/12 1305 10/03/12 1522  BP: 104/64 116/60 108/54 127/81  Pulse: 113 96 92 88  Temp:    98 F (36.7 C)  TempSrc:    Oral  Resp:    18  Height:      Weight:      SpO2:    98%   Alert pleasant oriented cc in pain  General: nad Cardiovascular: S1-S2 no murmur rub gallop Respiratory: Clinically clear  Discharge Instructions  Discharge Orders   Future Orders Complete By Expires     Diet - low sodium heart healthy  As directed     Discharge instructions  As directed     Comments:      Find a physician-you may follow with Dr. Algie Coffer until you are seen by a regular doctor.    Increase activity slowly  As directed         Medication List    TAKE these medications       albuterol 108 (90 BASE) MCG/ACT inhaler  Commonly known as:  PROVENTIL HFA;VENTOLIN HFA  Inhale 2 puffs into the lungs every 4 (four) hours as needed for wheezing or shortness of breath. For shortness of breath.     cloNIDine 0.2 MG tablet  Commonly known as:  CATAPRES  Take 1 tablet (0.2 mg total) by mouth 2 (two) times daily. For blood pressure     multivitamin with minerals Tabs  Take 1 tablet by mouth daily.     oxyCODONE 5 MG immediate release tablet  Commonly known as:  ROXICODONE  Take 1 tablet (5 mg total) by mouth every 4 (  four) hours as needed for pain.     pantoprazole 20 MG tablet  Commonly known as:  PROTONIX  Take 2 tablets (40 mg total) by mouth daily.     SEROQUEL XR 150 MG 24 hr tablet  Generic drug:  QUEtiapine Fumarate  Take 150 mg by mouth at bedtime.     traMADol 50 MG tablet  Commonly known as:  ULTRAM  Take 50 mg by mouth every 6 (six) hours as needed for pain.       Allergies  Allergen Reactions  . Trazodone And Nefazodone     Muscle spasms  . Shellfish-Derived Products Nausea And Vomiting      The results of significant diagnostics from this hospitalization (including imaging, microbiology, ancillary and  laboratory) are listed below for reference.    Significant Diagnostic Studies: Dg Ribs Unilateral W/chest Right  10/02/2012   *RADIOLOGY REPORT*  Clinical Data: Loss of consciousness  RIGHT RIBS AND CHEST - 3+ VIEW  Comparison: Chest radiograph 10/27/2011  Findings: Normal mediastinum and heart silhouette.  Costophrenic angles are clear.  No effusion, infiltrate, or pneumothorax.  There is a rib marker in the right anterior chest wall.  No associated rib fracture.  IMPRESSION:  1. No acute cardiopulmonary process. 2.  No evidence of rib fracture.   Original Report Authenticated By: Genevive Bi, M.D.   Ct Head Wo Contrast  10/03/2012   *RADIOLOGY REPORT*  Clinical Data: Syncope.  CT HEAD WITHOUT CONTRAST  Technique:  Contiguous axial images were obtained from the base of the skull through the vertex without contrast.  Comparison: CT of the head performed 02/16/2012  Findings: There is no evidence of acute infarction, mass lesion, or intra- or extra-axial hemorrhage on CT.  The posterior fossa, including the cerebellum, brainstem and fourth ventricle, is within normal limits.  The third and lateral ventricles, and basal ganglia are unremarkable in appearance.  The cerebral hemispheres are symmetric in appearance, with normal gray- white differentiation.  No mass effect or midline shift is seen.  There is no evidence of acute fracture; there is chronic deformity of the medial wall of the left orbit, reflecting remote blowout fracture.  Postoperative calvarial thinning is noted on the right side near the vertex.  The visualized portions of the orbits are within normal limits.  The paranasal sinuses and mastoid air cells are well-aerated.  No significant soft tissue abnormalities are seen.  IMPRESSION: No acute intracranial pathology seen on CT.  Postoperative and post- traumatic changes again noted.   Original Report Authenticated By: Tonia Ghent, M.D.   Nm Myocar Multi W/spect W/wall Motion /  Ef  10/03/2012   *RADIOLOGY REPORT*  Clinical Data:  Chest pain.  Hypertension.  Cocaine abuse.  MYOCARDIAL IMAGING WITH SPECT (REST AND PHARMACOLOGIC-STRESS) GATED LEFT VENTRICULAR WALL MOTION STUDY LEFT VENTRICULAR EJECTION FRACTION  Standard myocardial SPECT imaging was performed after resting intravenous injection of 10. Tc-42m tetrofosmin.  Subsequently, intravenous infusion of Lexiscan  was performed under the supervision of cardiology staff.  At peak effect of the drug, of Tc-5m tetrofosmin was injected intravenously and standard myocardial SPECT imaging was performed.  Quantitative gated imaging was also performed to evaluate left ventricular wall motion and estimated left ventricular ejection fraction.  Comparison:  Plain film of 10/02/2012.  Prior exam of 02/04/2008.  Findings:  Rest images demonstrate diaphragmatic attenuation artifact.  No fixed defect.  Normal left ventricular cavity size.  Stress images demonstrate no areas of reversibility to suggest inducible ischemia.  Evaluation  of wall motion demonstrates normal left ventricular wall motion and thickening.  Ejection fraction is estimated at 67%.  End diastolic volume of 72 cc.  End systolic volume of  24 cc.  IMPRESSION: 1.  No fixed or reversible defects to suggest inducible ischemia. 2.  Normal left ventricular wall motion and thickening. 3.  Normal ejection fraction.   Original Report Authenticated By: Jeronimo Greaves, M.D.    Microbiology: No results found for this or any previous visit (from the past 240 hour(s)).   Labs: Basic Metabolic Panel:  Recent Labs Lab 10/02/12 2110  NA 139  K 3.6  CL 103  CO2 21  GLUCOSE 74  BUN 7  CREATININE 0.65  CALCIUM 9.5   Liver Function Tests: No results found for this basename: AST, ALT, ALKPHOS, BILITOT, PROT, ALBUMIN,  in the last 168 hours No results found for this basename: LIPASE, AMYLASE,  in the last 168 hours No results found for this basename: AMMONIA,  in the last  168 hours CBC:  Recent Labs Lab 10/02/12 2110  WBC 7.4  HGB 11.2*  HCT 32.3*  MCV 97.0  PLT 228   Cardiac Enzymes:  Recent Labs Lab 10/03/12 0104 10/03/12 0830 10/03/12 1556  TROPONINI <0.30 <0.30 <0.30   BNP: BNP (last 3 results) No results found for this basename: PROBNP,  in the last 8760 hours CBG:  Recent Labs Lab 10/02/12 2114  GLUCAP 83       Signed:  Rhetta Mura  [facility] 10/03/2012, 4:49 PM

## 2012-10-04 NOTE — Discharge Summary (Signed)
Physician Signed Internal Medicine Progress Notes Service date: 10/03/2012 2:23 PM  Physician Discharge Summary    [name] ZOX:[id number] DOB: 1970-05-28 DOA: 10/02/2012   PCP: No PCP Per Patient   Admit date: 10/02/2012 Discharge date: 10/03/2012   Time spent: 35 minutes   Recommendations for Outpatient Follow-up:   Patient has been encouraged to find a primary care physician Patient has been encouraged to cyst from alcohol cannabis use. 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 He hit his ribs on the dresser and was noted to have potential Wolff-Parkinson-White pattern on EKG with T wave inversions Because of this cardiology was consulted and excess stress test was performed showing equal focal findings. Patient was cleared by cardiologist Dr.Kadakia for discharge home-serial troponins were negative. I discussed case with Dr. Leron Croak after stress test results and he stated that patient followup in the outpatient setting for an echocardiogram. 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     Procedures: CT head Chest x-ray Stress test = 09/25/12 = EF 67%, no fixed or reversible deficit, normal left  ventricular wall, normal ejection   Consultations: Cardiology Dr. Algie Coffer   Discharge Exam: Filed Vitals:     10/03/12 1301  10/03/12 1303  10/03/12 1305  10/03/12 1522   BP:  104/64  116/60  108/54  127/81   Pulse:  113  96  92  88   Temp:        98 F (36.7 C)   TempSrc:        Oral   Resp:        18   Height:           Weight:           SpO2:        98%    Alert pleasant oriented cc in pain   General: nad Cardiovascular: S1-S2 no murmur rub gallop Respiratory: Clinically clear   Discharge Instructions    Discharge Orders     Future Orders  Complete By  Expires        Diet - low sodium heart healthy   As directed         Discharge instructions   As directed         Comments:         Find a physician-you may follow with Dr. Algie Coffer until you are seen by a regular doctor.       Increase activity slowly   As directed              Medication List      TAKE these medications  albuterol 108 (90 BASE) MCG/ACT inhaler   Commonly known as:  PROVENTIL HFA;VENTOLIN HFA   Inhale 2 puffs into the lungs every 4 (four) hours as needed for wheezing or shortness of breath. For shortness of breath.         cloNIDine 0.2 MG tablet   Commonly known as:  CATAPRES   Take 1 tablet (0.2 mg total) by mouth 2 (two) times daily. For blood pressure         multivitamin with minerals Tabs   Take 1 tablet by mouth daily.         oxyCODONE 5 MG immediate release tablet   Commonly known as:  ROXICODONE   Take 1 tablet (5 mg total) by mouth every 4 (four) hours as needed for pain.         pantoprazole 20 MG tablet   Commonly known as:  PROTONIX   Take 2 tablets (40 mg total) by mouth daily.         SEROQUEL XR 150 MG 24 hr tablet   Generic drug:  QUEtiapine Fumarate   Take 150 mg by mouth at bedtime.         traMADol 50 MG tablet   Commonly known as:  ULTRAM   Take 50 mg by mouth every 6 (six) hours as needed for pain.           Allergies   Allergen   Reactions   .  Trazodone And Nefazodone         Muscle spasms   .  Shellfish-Derived Products  Nausea And Vomiting       --------------------------------------------------------------------------------   The results of significant diagnostics from this hospitalization (including imaging, microbiology, ancillary and laboratory) are listed below for reference.       Significant Diagnostic Studies: Dg Ribs Unilateral W/chest Right   10/02/2012   *RADIOLOGY REPORT*  Clinical Data: Loss of consciousness  RIGHT RIBS AND CHEST - 3+ VIEW  Comparison: Chest radiograph 10/27/2011  Findings: Normal mediastinum and heart silhouette.  Costophrenic angles are clear.  No effusion, infiltrate, or pneumothorax.  There is a rib marker in the right anterior chest wall.  No associated rib fracture.  IMPRESSION:  1. No acute cardiopulmonary process. 2.  No evidence of rib fracture.   Original Report Authenticated By: Genevive Bi, M.D.    Ct Head Wo Contrast   10/03/2012   *RADIOLOGY REPORT*  Clinical Data: Syncope.  CT HEAD WITHOUT CONTRAST  Technique:  Contiguous axial images were obtained from the base of the skull through the vertex without contrast.  Comparison: CT of the head performed 02/16/2012  Findings: There is no evidence of acute infarction, mass lesion, or intra- or extra-axial hemorrhage on CT.  The posterior fossa, including the cerebellum, brainstem and fourth ventricle, is within normal limits.  The third and lateral ventricles, and basal ganglia are unremarkable in appearance.  The cerebral hemispheres are symmetric in appearance, with normal gray- white differentiation.  No mass effect or midline shift is seen.  There is no evidence of acute fracture; there is chronic deformity of the medial wall of the left orbit, reflecting remote blowout fracture.  Postoperative calvarial thinning is noted on the right side near the vertex.  The visualized portions of the orbits are within normal limits.   The paranasal sinuses and mastoid air cells are well-aerated.  No significant soft tissue abnormalities are seen.  IMPRESSION: No acute intracranial pathology seen on CT.  Postoperative and post- traumatic changes  again noted.   Original Report Authenticated By: Tonia Ghent, M.D.    Nm Myocar Multi W/spect W/wall Motion / Ef   10/03/2012   *RADIOLOGY REPORT*  Clinical Data:  Chest pain.  Hypertension.  Cocaine abuse.  MYOCARDIAL IMAGING WITH SPECT (REST AND PHARMACOLOGIC-STRESS) GATED LEFT VENTRICULAR WALL MOTION STUDY LEFT VENTRICULAR EJECTION FRACTION  Standard myocardial SPECT imaging was performed after resting intravenous injection of 10. Tc-3m tetrofosmin.  Subsequently, intravenous infusion of Lexiscan  was performed under the supervision of cardiology staff.  At peak effect of the drug, of Tc-69m tetrofosmin was injected intravenously and standard myocardial SPECT imaging was performed.  Quantitative gated imaging was also performed to evaluate left ventricular wall motion and estimated left ventricular ejection fraction.  Comparison:  Plain film of 10/02/2012.  Prior exam of 02/04/2008.  Findings:  Rest images demonstrate diaphragmatic attenuation artifact.  No fixed defect.  Normal left ventricular cavity size.  Stress images demonstrate no areas of reversibility to suggest inducible ischemia.  Evaluation of wall motion demonstrates normal left ventricular wall motion and thickening.  Ejection fraction is estimated at 67%.  End diastolic volume of 72 cc.  End systolic volume of  24 cc.  IMPRESSION: 1.  No fixed or reversible defects to suggest inducible ischemia. 2.  Normal left ventricular wall motion and thickening. 3.  Normal ejection fraction.   Original Report Authenticated By: Jeronimo Greaves, M.D.      Microbiology: No results found for this or any previous visit (from the past 240 hour(s)).    Labs: Basic Metabolic Panel: Recent Labs Lab  10/02/12 2110   NA  139   K   3.6   CL  103   CO2  21   GLUCOSE  74   BUN  7   CREATININE  0.65   CALCIUM  9.5    Liver Function Tests: No results found for this basename: AST, ALT, ALKPHOS, BILITOT, PROT, ALBUMIN,  in the last 168 hours No results found for this basename: LIPASE, AMYLASE,  in the last 168 hours No results found for this basename: AMMONIA,  in the last 168 hours CBC: Recent Labs Lab  10/02/12 2110   WBC  7.4   HGB  11.2*   HCT  32.3*   MCV  97.0   PLT  228    Cardiac Enzymes: Recent Labs Lab  10/03/12 0104  10/03/12 0830  10/03/12 1556   TROPONINI  <0.30  <0.30  <0.30    BNP: BNP (last 3 results) No results found for this basename: PROBNP,  in the last 8760 hours CBG: Recent Labs Lab  10/02/12 2114   GLUCAP  83            Signed:   Rhetta Mura        [facility] 10/03/2012, 4:49 PM

## 2012-10-07 DIAGNOSIS — Z9151 Personal history of suicidal behavior: Secondary | ICD-10-CM

## 2012-10-07 DIAGNOSIS — Z9289 Personal history of other medical treatment: Secondary | ICD-10-CM

## 2012-10-07 HISTORY — DX: Personal history of suicidal behavior: Z91.51

## 2012-10-07 HISTORY — DX: Personal history of other medical treatment: Z92.89

## 2012-10-24 ENCOUNTER — Ambulatory Visit: Payer: Self-pay

## 2012-10-25 IMAGING — US US ABDOMEN COMPLETE
1 series · 14 of 25 positions shown · non-contrast
Comparison: CT 05/05/2009

CLINICAL DATA: Abnormal LFTs.

COMPLETE ABDOMINAL ULTRASOUND

[Series 1: us abdomen complete · 0.30mm/px · 14 of 70 slices shown]
[im 1/70]
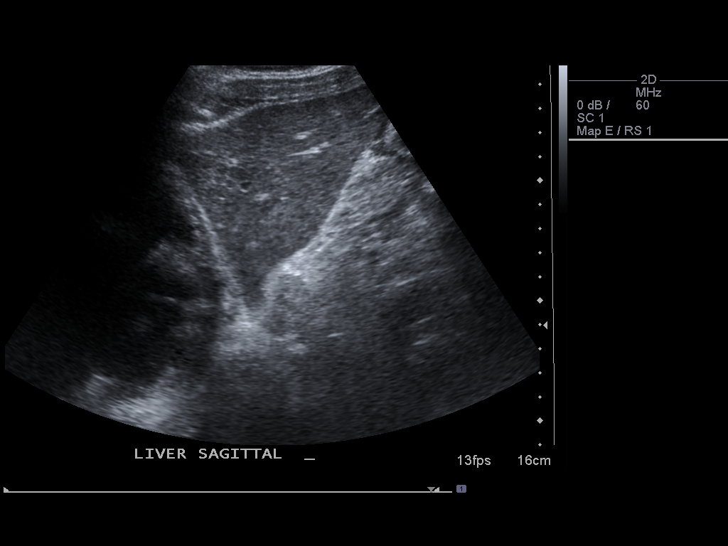
[im 6/70]
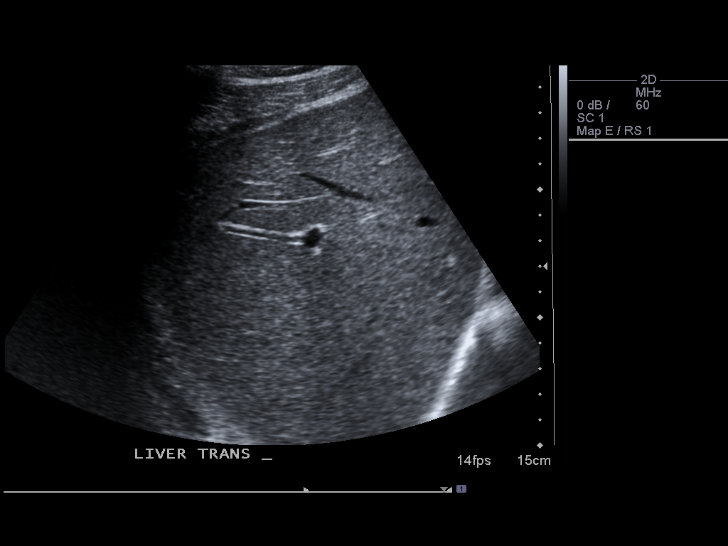
[im 12/70]
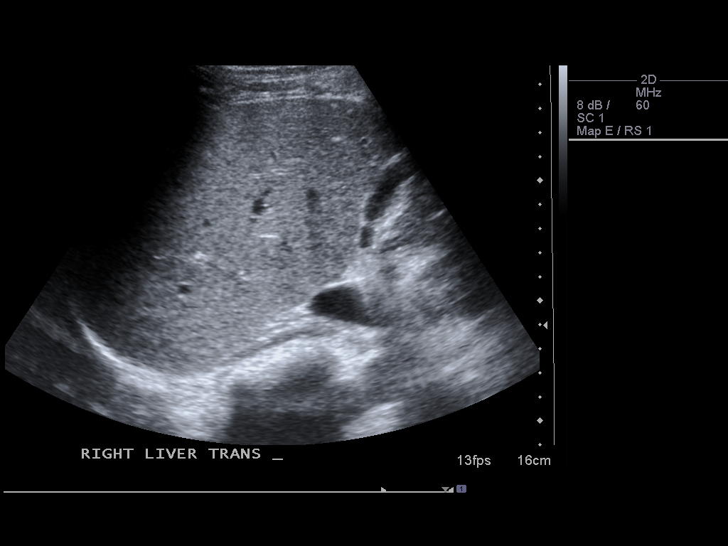
[im 18/70]
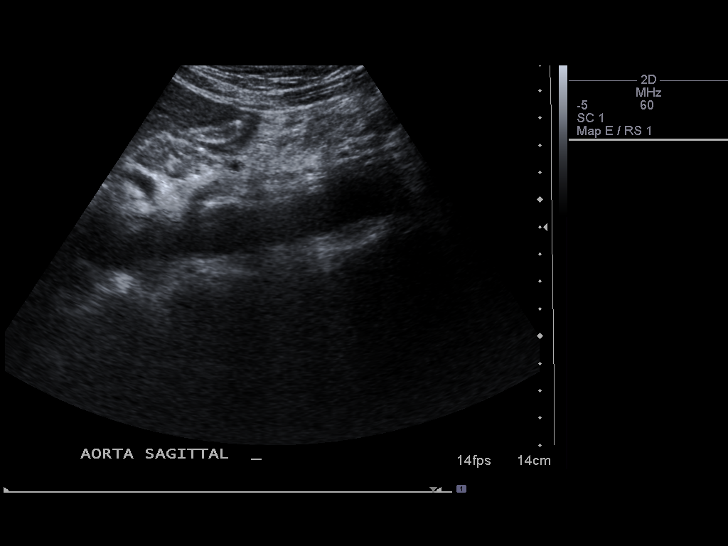
[im 24/70]
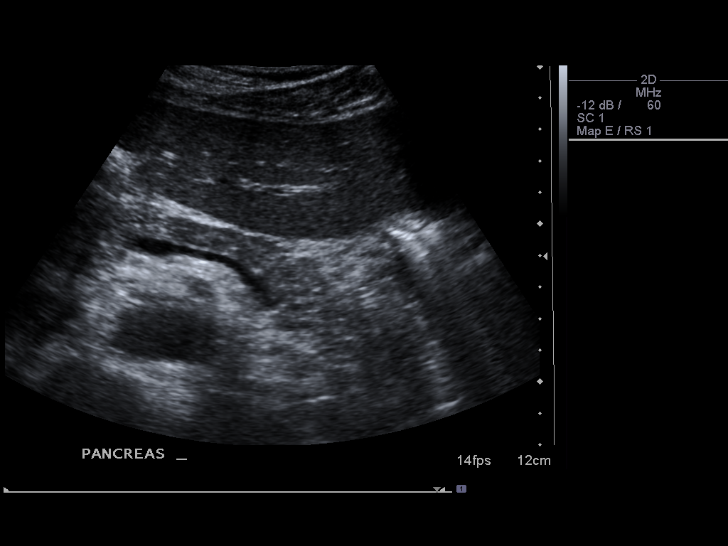
[im 26/70]
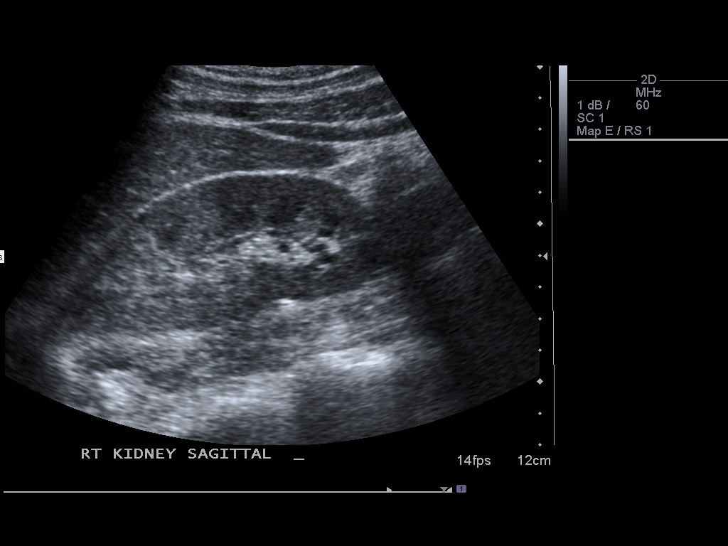
[im 32/70]
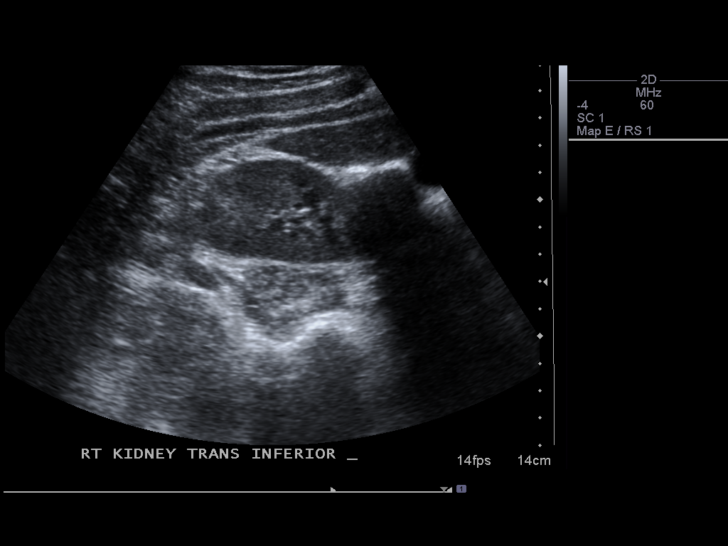
[im 38/70]
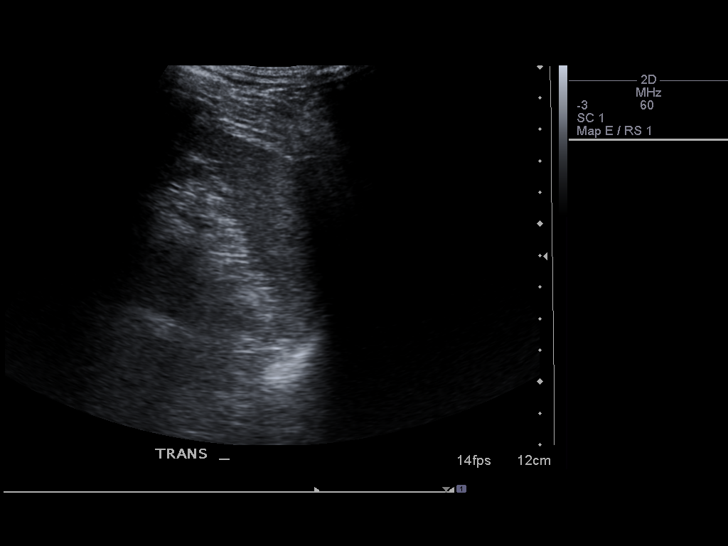
[im 44/70]
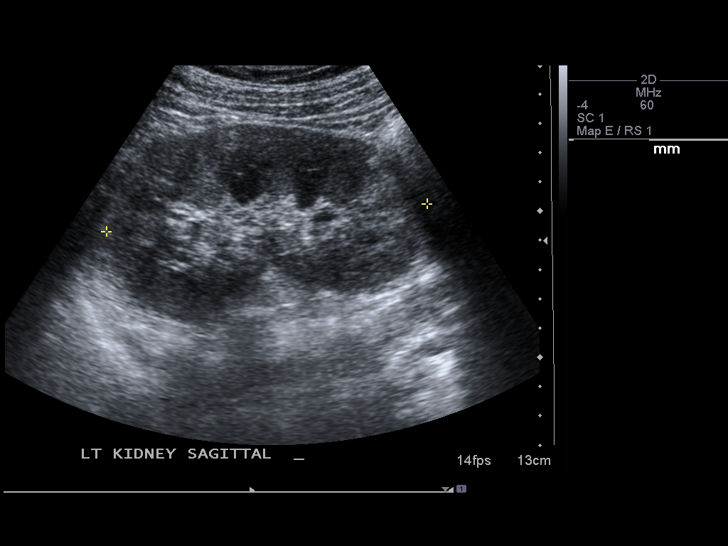
[im 47/70]
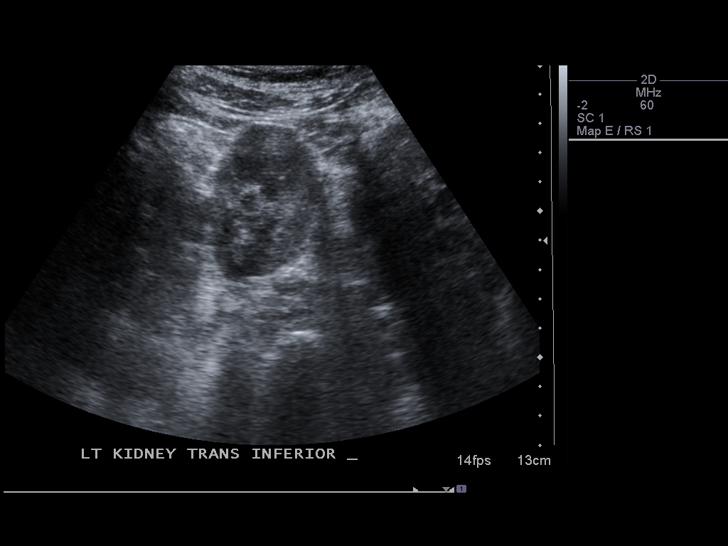
[im 52/70]
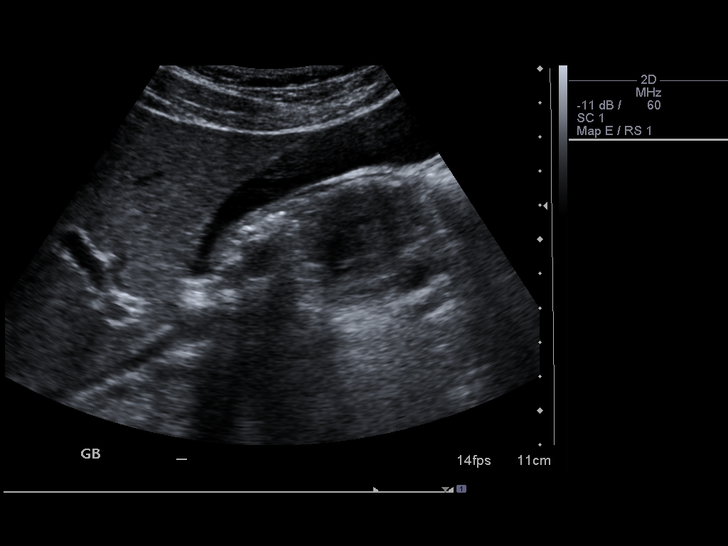
[im 58/70]
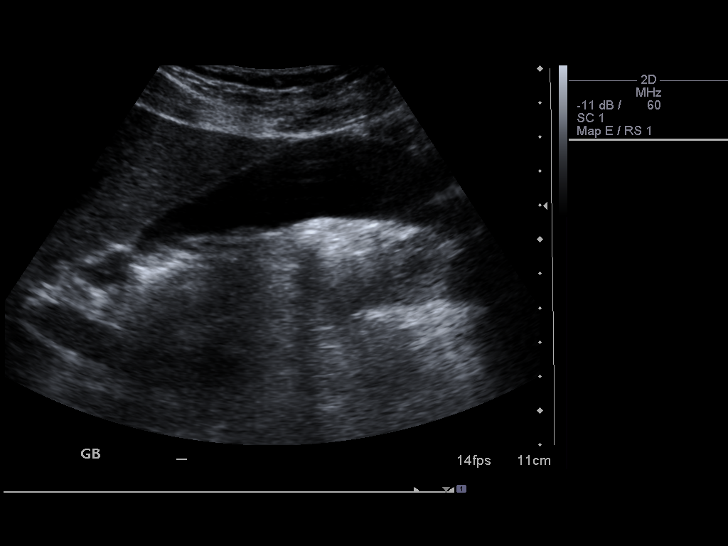
[im 64/70]
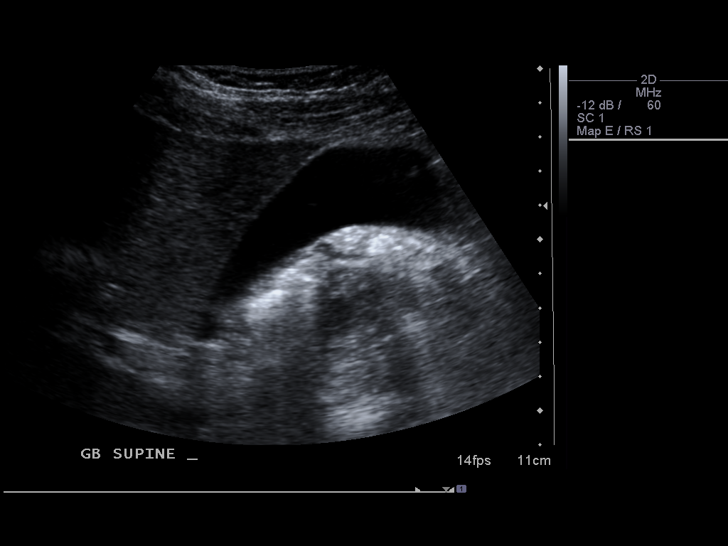
[im 70/70]
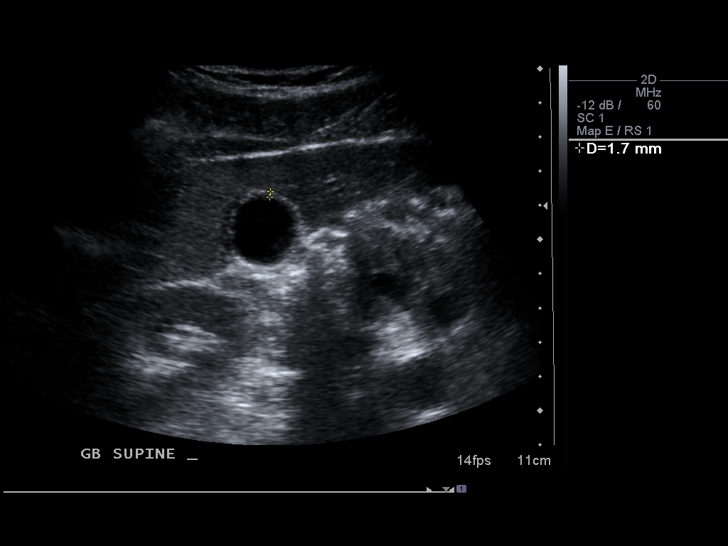

[14 of 25 positions shown; findings below may reference images not displayed]

FINDINGS: Gallbladder:  No gallstones, gallbladder wall thickening, or
pericholecystic fluid.

Common bile duct:   Normal caliber, 3 mm.

Liver:  No focal lesion identified.  Within normal limits in
parenchymal echogenicity.

IVC:  Appears normal.

Pancreas:  No focal abnormality seen.  Previously seen
peripancreatic fluid by CT cannot be appreciated by ultrasound.

Spleen:  Within normal limits in size and echotexture.

Right Kidney:   Normal in size and parenchymal echogenicity.  No
evidence of mass or hydronephrosis.

Left Kidney:  Normal in size and parenchymal echogenicity.  No
evidence of mass or hydronephrosis.

Abdominal aorta:  No aneurysm identified.
IMPRESSION: No acute findings sonographically.

## 2012-11-08 ENCOUNTER — Encounter: Payer: Self-pay | Admitting: Family Medicine

## 2012-11-08 ENCOUNTER — Ambulatory Visit: Payer: Self-pay | Attending: Family Medicine | Admitting: Family Medicine

## 2012-11-08 VITALS — BP 134/92 | HR 83 | Temp 98.6°F | Resp 18 | Ht 68.0 in | Wt 136.6 lb

## 2012-11-08 DIAGNOSIS — F3289 Other specified depressive episodes: Secondary | ICD-10-CM

## 2012-11-08 DIAGNOSIS — F329 Major depressive disorder, single episode, unspecified: Secondary | ICD-10-CM

## 2012-11-08 DIAGNOSIS — R9431 Abnormal electrocardiogram [ECG] [EKG]: Secondary | ICD-10-CM

## 2012-11-08 DIAGNOSIS — R109 Unspecified abdominal pain: Secondary | ICD-10-CM

## 2012-11-08 DIAGNOSIS — Z862 Personal history of diseases of the blood and blood-forming organs and certain disorders involving the immune mechanism: Secondary | ICD-10-CM

## 2012-11-08 DIAGNOSIS — Z8639 Personal history of other endocrine, nutritional and metabolic disease: Secondary | ICD-10-CM

## 2012-11-08 DIAGNOSIS — I456 Pre-excitation syndrome: Secondary | ICD-10-CM

## 2012-11-08 DIAGNOSIS — J309 Allergic rhinitis, unspecified: Secondary | ICD-10-CM

## 2012-11-08 DIAGNOSIS — F17200 Nicotine dependence, unspecified, uncomplicated: Secondary | ICD-10-CM

## 2012-11-08 DIAGNOSIS — M549 Dorsalgia, unspecified: Secondary | ICD-10-CM

## 2012-11-08 DIAGNOSIS — K219 Gastro-esophageal reflux disease without esophagitis: Secondary | ICD-10-CM | POA: Insufficient documentation

## 2012-11-08 DIAGNOSIS — F1010 Alcohol abuse, uncomplicated: Secondary | ICD-10-CM

## 2012-11-08 DIAGNOSIS — J45909 Unspecified asthma, uncomplicated: Secondary | ICD-10-CM

## 2012-11-08 DIAGNOSIS — F1910 Other psychoactive substance abuse, uncomplicated: Secondary | ICD-10-CM

## 2012-11-08 DIAGNOSIS — I10 Essential (primary) hypertension: Secondary | ICD-10-CM

## 2012-11-08 MED ORDER — CLONIDINE HCL 0.2 MG PO TABS: 0.2000 mg | ORAL_TABLET | Freq: Two times a day (BID) | ORAL | Status: DC

## 2012-11-08 MED ORDER — ALBUTEROL SULFATE HFA 108 (90 BASE) MCG/ACT IN AERS: 2.0000 | INHALATION_SPRAY | RESPIRATORY_TRACT | Status: DC | PRN

## 2012-11-08 MED ORDER — MAGNESIUM HYDROXIDE 400 MG/5ML PO SUSP: 5.0000 mL | Freq: Every day | ORAL | Status: DC | PRN

## 2012-11-08 MED ORDER — OMEPRAZOLE 20 MG PO CPDR: 20.0000 mg | DELAYED_RELEASE_CAPSULE | Freq: Every day | ORAL | Status: DC

## 2012-11-08 NOTE — Progress Notes (Signed)
Patient ID: [name], male   DOB: [birth year], 42 y.o.   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  Pt has occasional abdominal pain.  No nausea or vomiting or fever.  He has gerd and reports having loose stools.  No chest pain and no SOB.  No headaches.  Pt needs refills of his medications.    Allergies  Allergen Reactions  . Trazodone And Nefazodone     Muscle spasms  . Shellfish-Derived Products Nausea And Vomiting   Past Medical History  Diagnosis Date  . Hypertension   . Asthma   . Pancreatitis   . Cocaine abuse   . Depression    Current Outpatient Prescriptions on File Prior to Visit  Medication Sig Dispense Refill  . Multiple Vitamin (MULTIVITAMIN WITH MINERALS) TABS Take 1 tablet by mouth daily.      . QUEtiapine Fumarate (SEROQUEL XR) 150 MG 24 hr tablet Take 150 mg by mouth at bedtime.      . [DISCONTINUED] lansoprazole (PREVACID) 30 MG capsule Take 30 mg by mouth 2 (two) times daily.       No current facility-administered medications on file prior to visit.   Family History  Problem Relation Age of Onset  . Hypertension Other   . Coronary artery disease Other    History   Social History  . Marital Status: Single    Spouse Name: N/A    Number of Children: N/A  . Years of Education: N/A   Occupational History  . Not on file.   Social History Main Topics  . Smoking status: Current Every Day Smoker -- 1.00 packs/day    Types: Cigarettes  . Smokeless tobacco: Current User  . Alcohol Use: Yes     Comment: occasionaly  . Drug Use: Yes    Special: Marijuana     Comment: last use yesterday 5/27  . Sexually Active: Not on file   Other Topics Concern  . Not on file   Social History Narrative  . No narrative on file    Review of Systems  Constitutional: Negative for fever, chills, diaphoresis, activity change, appetite change and fatigue.  HENT: Negative for ear  pain, nosebleeds, congestion, facial swelling, rhinorrhea, neck pain, neck stiffness and ear discharge.   Eyes: Negative for pain, discharge, redness, itching and visual disturbance.  Respiratory: Negative for cough, choking, chest tightness, shortness of breath, wheezing and stridor.   Cardiovascular: Negative for chest pain, palpitations and leg swelling.  Gastrointestinal: Negative for abdominal distention.  Genitourinary: Negative for dysuria, urgency, frequency, hematuria, flank pain, decreased urine volume, difficulty urinating and dyspareunia.  Musculoskeletal: Negative for back pain, joint swelling, arthralgias and gait problem.  Neurological: Negative for dizziness, tremors, seizures, syncope, facial asymmetry, speech difficulty, weakness, light-headedness, numbness and headaches.  Hematological: Negative for adenopathy. Does not bruise/bleed easily.  Psychiatric/Behavioral: Negative for hallucinations, behavioral problems, confusion, dysphoric mood, decreased concentration and agitation.    Objective:   Filed Vitals:   11/08/12 1140  BP: 134/92  Pulse: 83  Temp: 98.6 F (37 C)  Resp: 18    Physical Exam  Constitutional: Appears well-developed and well-nourished. No distress.  HENT: Normocephalic. External right and left ear normal. Oropharynx is clear and moist.  Eyes: Conjunctivae and EOM are normal. PERRLA, no scleral icterus.  Neck: Normal ROM. Neck supple. No JVD. No tracheal deviation. No thyromegaly.  CVS: RRR, S1/S2 +, no murmurs, no gallops,  no carotid bruit.  Pulmonary: Effort and breath sounds normal, no stridor, rhonchi, wheezes, rales.  Abdominal: Soft. BS +,  no distension, tenderness, rebound or guarding.  Musculoskeletal: Normal range of motion. No edema and no tenderness.  Lymphadenopathy: No lymphadenopathy noted, cervical, inguinal. Neuro: Alert. Normal reflexes, muscle tone coordination. No cranial nerve deficit. Skin: Skin is warm and dry. No rash  noted. Not diaphoretic. No erythema. No pallor.  Psychiatric: Normal mood and affect. Behavior, judgment, thought content normal.   Lab Results  Component Value Date   WBC 7.4 10/02/2012   HGB 11.2* 10/02/2012   HCT 32.3* 10/02/2012   MCV 97.0 10/02/2012   PLT 228 10/02/2012   Lab Results  Component Value Date   CREATININE 0.65 10/02/2012   BUN 7 10/02/2012   NA 139 10/02/2012   K 3.6 10/02/2012   CL 103 10/02/2012   CO2 21 10/02/2012    No results found for this basename: HGBA1C   Lipid Panel     Component Value Date/Time   CHOL  Value: 114        ATP III CLASSIFICATION:  <200     mg/dL   Desirable  161-096  mg/dL   Borderline High  >=045    mg/dL   High        08/15/8117 0500   TRIG 146 08/18/2010 0500   HDL 45 08/18/2010 0500   CHOLHDL 2.5 08/18/2010 0500   VLDL 29 08/18/2010 0500   LDLCALC  Value: 40        Total Cholesterol/HDL:CHD Risk Coronary Heart Disease Risk Table                     Men   Women  1/2 Average Risk   3.4   3.3  Average Risk       5.0   4.4  2 X Average Risk   9.6   7.1  3 X Average Risk  23.4   11.0        Use the calculated Patient Ratio above and the CHD Risk Table to determine the patient's CHD Risk.        ATP III CLASSIFICATION (LDL):  <100     mg/dL   Optimal  147-829  mg/dL   Near or Above                    Optimal  130-159  mg/dL   Borderline  562-130  mg/dL   High  >865     mg/dL   Very High 7/84/6962 9528       Assessment and plan:   Patient Active Problem List   Diagnosis Date Noted  . Syncope and collapse 10/03/2012  . [name] Parkinson White pattern seen on electrocardiogram 10/03/2012  . BACK PAIN 04/30/2010  . GANGLION CYST 04/30/2010  . ABDOMINAL PAIN 04/30/2010  . DEPRESSION 05/28/2009  . HYPERTENSION 05/28/2009  . LIVER FUNCTION TESTS, ABNORMAL, HX OF 02/02/2008  . ALLERGIC RHINITIS 04/24/2007  . HIATAL HERNIA WITH REFLUX 04/24/2007  . ALCOHOL ABUSE 03/23/2007  . TOBACCO ABUSE 03/23/2007  . SUBSTANCE ABUSE, MULTIPLE 03/23/2007  . ASTHMA  03/23/2007  . DENTAL PAIN 03/23/2007   The patient was counseled on the dangers of tobacco use, and was advised to quit.  Reviewed strategies to maximize success, including removing cigarettes and smoking materials from environment and stress management.  EKG reviewed  Pt has severe GERD and will try omeprazole 20 mg po daily  Check labs  today --- follow up lab results  Referal to cardiology for WPW   RTC in 3 months  Check urine tox screen  The patient was given clear instructions to go to ER or return to medical center if symptoms don't improve, worsen or new problems develop.  The patient verbalized understanding.  The patient was told to call to get any lab results if not heard anything in the next week.    Rodney Langton, MD, CDE, FAAFP [facility] [place], [place]

## 2012-11-08 NOTE — Patient Instructions (Addendum)
Substance Abuse Your exam indicates that you have a problem with substance abuse. Substance abuse is the misuse of alcohol or drugs that causes problems in family life, friendships, and work relationships. Substance abuse is the most important cause of premature illness, disability, and death in our society. It is also the greatest threat to a person's mental and spiritual well being. Substance abuse can start out in an innocent way, such as social drinking or taking a little extra medication prescribed by your doctor. No one starts out with the intention of becoming an alcoholic or an addict. Substance abuse victims cannot control their use of alcohol or drugs. They may become intoxicated daily or go on weekend binges. Often there is a strong desire to quit, but attempts to stop using often fail. Encounters with law enforcement or conflicts with family members, friends, and work associates are signs of a potential problem. Recovery is always possible, although the craving for some drugs makes it difficult to quit without assistance. Many treatment programs are available to help people stop abusing alcohol or drugs. The first step in treatment is to admit you have a problem. This is a major hurdle because denial is a powerful force with substance abuse. Alcoholics Anonymous, Narcotics Anonymous, Cocaine Anonymous, and other recovery groups and programs can be very useful in helping people to quit. If you do not feel okay about your drug or alcohol use and if it is causing you trouble, we want to encourage you to talk about it with your doctor or with someone from a recovery group who can help you. You could also call the [employer] on Drug Abuse at 1-800-662-HELP. It is up to you to take the first step. [facility] and ALA-TE[facility] are support groups for friends and family members of an alcohol or drug dependent person. The people who love and care for the alcoholic or addicted person often need help, too. For  information about these organizations, check your phone directory or call a local alcohol or drug treatment center. Document Released: 06/02/2004 Document Revised: 07/18/2011 Document Reviewed: 04/26/2008 [facility] Patient Information 2014 [place], [place]. Hypertension As your heart beats, it forces blood through your arteries. This force is your blood pressure. If the pressure is too high, it is called hypertension (HTN) or high blood pressure. HTN is dangerous because you may have it and not know it. High blood pressure may mean that your heart has to work harder to pump blood. Your arteries may be narrow or stiff. The extra work puts you at risk for heart disease, stroke, and other problems.  Blood pressure consists of two numbers, a higher number over a lower, 110/72, for example. It is stated as "110 over 72." The ideal is below 120 for the top number (systolic) and under 80 for the bottom (diastolic). Write down your blood pressure today. You should pay close attention to your blood pressure if you have certain conditions such as:  Heart failure.  Prior heart attack.  Diabetes  Chronic kidney disease.  Prior stroke.  Multiple risk factors for heart disease. To see if you have HTN, your blood pressure should be measured while you are seated with your arm held at the level of the heart. It should be measured at least twice. A one-time elevated blood pressure reading (especially in the Emergency Department) does not mean that you need treatment. There may be conditions in which the blood pressure is different between your right and left arms. It is important to  see your caregiver soon for a recheck. Most people have essential hypertension which means that there is not a specific cause. This type of high blood pressure may be lowered by changing lifestyle factors such as:  Stress.  Smoking.  Lack of exercise.  Excessive weight.  Drug/tobacco/alcohol use.  Eating less salt. Most  people do not have symptoms from high blood pressure until it has caused damage to the body. Effective treatment can often prevent, delay or reduce that damage. TREATMENT  When a cause has been identified, treatment for high blood pressure is directed at the cause. There are a large number of medications to treat HTN. These fall into several categories, and your caregiver will help you select the medicines that are best for you. Medications may have side effects. You should review side effects with your caregiver. If your blood pressure stays high after you have made lifestyle changes or started on medicines,   Your medication(s) may need to be changed.  Other problems may need to be addressed.  Be certain you understand your prescriptions, and know how and when to take your medicine.  Be sure to follow up with your caregiver within the time frame advised (usually within two weeks) to have your blood pressure rechecked and to review your medications.  If you are taking more than one medicine to lower your blood pressure, make sure you know how and at what times they should be taken. Taking two medicines at the same time can result in blood pressure that is too low. SEEK IMMEDIATE MEDICAL CARE IF:  You develop a severe headache, blurred or changing vision, or confusion.  You have unusual weakness or numbness, or a faint feeling.  You have severe chest or abdominal pain, vomiting, or breathing problems. MAKE SURE YOU:   Understand these instructions.  Will watch your condition.  Will get help right away if you are not doing well or get worse. Document Released: 04/25/2005 Document Revised: 07/18/2011 Document Reviewed: 12/14/2007 [facility] Patient Information 2014 [place], [place]. Gastroesophageal Reflux Disease, Adult Gastroesophageal reflux disease (GERD) happens when acid from your stomach flows up into the esophagus. When acid comes in contact with the esophagus, the acid causes  soreness (inflammation) in the esophagus. Over time, GERD may create small holes (ulcers) in the lining of the esophagus. CAUSES   Increased body weight. This puts pressure on the stomach, making acid rise from the stomach into the esophagus.  Smoking. This increases acid production in the stomach.  Drinking alcohol. This causes decreased pressure in the lower esophageal sphincter (valve or ring of muscle between the esophagus and stomach), allowing acid from the stomach into the esophagus.  Late evening meals and a full stomach. This increases pressure and acid production in the stomach.  A malformed lower esophageal sphincter. Sometimes, no cause is found. SYMPTOMS   Burning pain in the lower part of the mid-chest behind the breastbone and in the mid-stomach area. This may occur twice a week or more often.  Trouble swallowing.  Sore throat.  Dry cough.  Asthma-like symptoms including chest tightness, shortness of breath, or wheezing. DIAGNOSIS  Your caregiver may be able to diagnose GERD based on your symptoms. In some cases, X-rays and other tests may be done to check for complications or to check the condition of your stomach and esophagus. TREATMENT  Your caregiver may recommend over-the-counter or prescription medicines to help decrease acid production. Ask your caregiver before starting or adding any new medicines.  HOME CARE  INSTRUCTIONS   Change the factors that you can control. Ask your caregiver for guidance concerning weight loss, quitting smoking, and alcohol consumption.  Avoid foods and drinks that make your symptoms worse, such as:  Caffeine or alcoholic drinks.  Chocolate.  Peppermint or mint flavorings.  Garlic and onions.  Spicy foods.  Citrus fruits, such as oranges, lemons, or limes.  Tomato-based foods such as sauce, chili, salsa, and pizza.  Fried and fatty foods.  Avoid lying down for the 3 hours prior to your bedtime or prior to taking a  nap.  Eat small, frequent meals instead of large meals.  Wear loose-fitting clothing. Do not wear anything tight around your waist that causes pressure on your stomach.  Raise the head of your bed 6 to 8 inches with wood blocks to help you sleep. Extra pillows will not help.  Only take over-the-counter or prescription medicines for pain, discomfort, or fever as directed by your caregiver.  Do not take aspirin, ibuprofen, or other nonsteroidal anti-inflammatory drugs (NSAIDs). SEEK IMMEDIATE MEDICAL CARE IF:   You have pain in your arms, neck, jaw, teeth, or back.  Your pain increases or changes in intensity or duration.  You develop nausea, vomiting, or sweating (diaphoresis).  You develop shortness of breath, or you faint.  Your vomit is green, yellow, black, or looks like coffee grounds or blood.  Your stool is red, bloody, or black. These symptoms could be signs of other problems, such as heart disease, gastric bleeding, or esophageal bleeding. MAKE SURE YOU:   Understand these instructions.  Will watch your condition.  Will get help right away if you are not doing well or get worse. Document Released: 02/02/2005 Document Revised: 07/18/2011 Document Reviewed: 11/12/2010 [facility] Patient Information 2014 [place], [place].

## 2012-11-08 NOTE — Progress Notes (Signed)
11/08/12 Patient present to establish doctor and check pancreatitis. P.Daylene Vandenbosch,RN BSN [facility]

## 2012-11-09 LAB — COMPLETE METABOLIC PANEL WITH GFR
ALT: 53 U/L (ref 0–53)
Alkaline Phosphatase: 89 U/L (ref 39–117)
CO2: 23 mEq/L (ref 19–32)
Creat: 0.65 mg/dL (ref 0.50–1.35)
GFR, Est African American: >89 mL/min
GFR, Est Non African American: >89 mL/min
Sodium: 137 mEq/L (ref 135–145)
Total Bilirubin: 0.7 mg/dL (ref 0.3–1.2)

## 2012-11-09 LAB — LIPID PANEL
HDL: 95 mg/dL (ref >39)
LDL Cholesterol: 80 mg/dL (ref 0–99)
Total CHOL/HDL Ratio: 2.1 Ratio
Triglycerides: 144 mg/dL (ref <150)

## 2012-11-12 NOTE — Progress Notes (Signed)
Quick Note:  Please inform patient that his labs show improvement. His liver enzymes are much improved and close to being back to normal. His lipase level is within normal limits.    Rodney Langton, MD, CDE, FAAFP [facility] [place], [place]   ______

## 2012-11-13 LAB — DRUG SCREEN, URINE
Benzodiazepines: NEGATIVE ng/mL
Cannabinoid: NEGATIVE ng/mL
Opiates: NEGATIVE ng/mL
Phencyclidine: NEGATIVE ng/mL

## 2012-11-15 ENCOUNTER — Telehealth: Payer: Self-pay

## 2012-11-15 NOTE — Telephone Encounter (Signed)
Message copied by Lestine Mount on Thu Nov 15, 2012 12:46 PM ------      Message from: Cleora Fleet      Created: Mon Nov 12, 2012 10:22 AM       Please inform patient that his labs show improvement.  His liver enzymes are much improved and close to being back to normal.  His lipase level is within normal limits.                     Rodney Langton, MD, CDE, FAAFP      [facility]      [facility]      [place], [place]        ------

## 2012-11-15 NOTE — Telephone Encounter (Signed)
Left message for patient to return our call.

## 2012-11-26 IMAGING — CR DG ABDOMEN ACUTE W/ 1V CHEST
3 series · 3 of 3 positions shown · non-contrast
Comparison: 07/09/2009

CLINICAL DATA: Left-sided abdominal pain.  Vomiting.  Distention.
Asthma.

ACUTE ABDOMEN SERIES (ABDOMEN 2 VIEW & CHEST 1 VIEW)

[w chest pa]
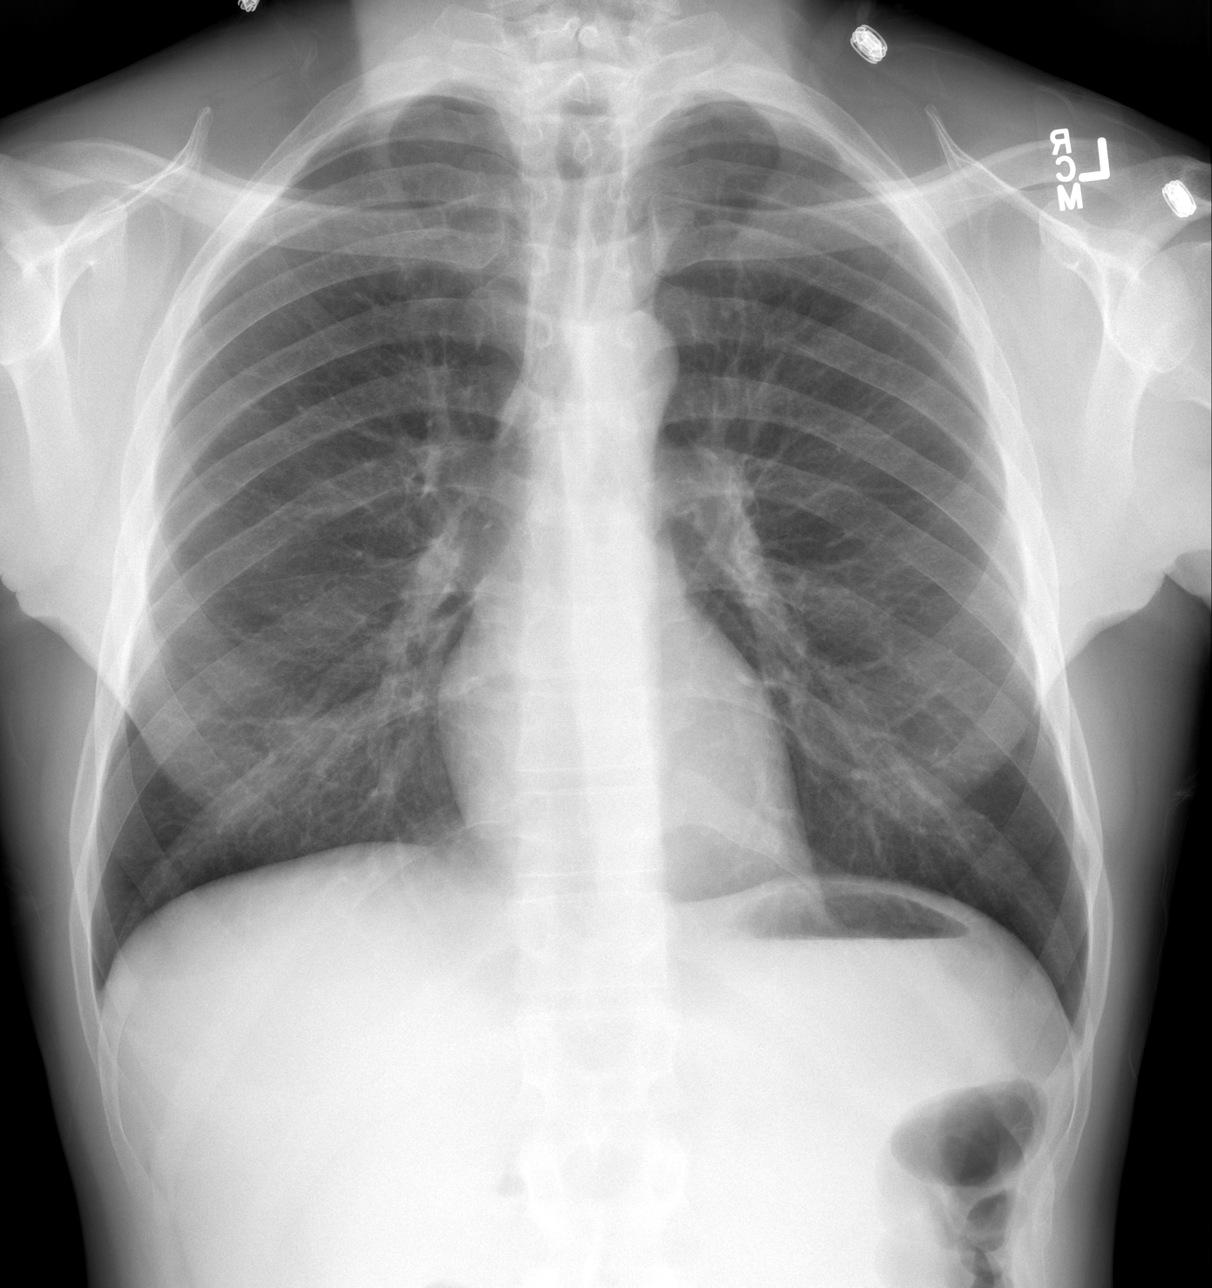

[w abdomen upright]
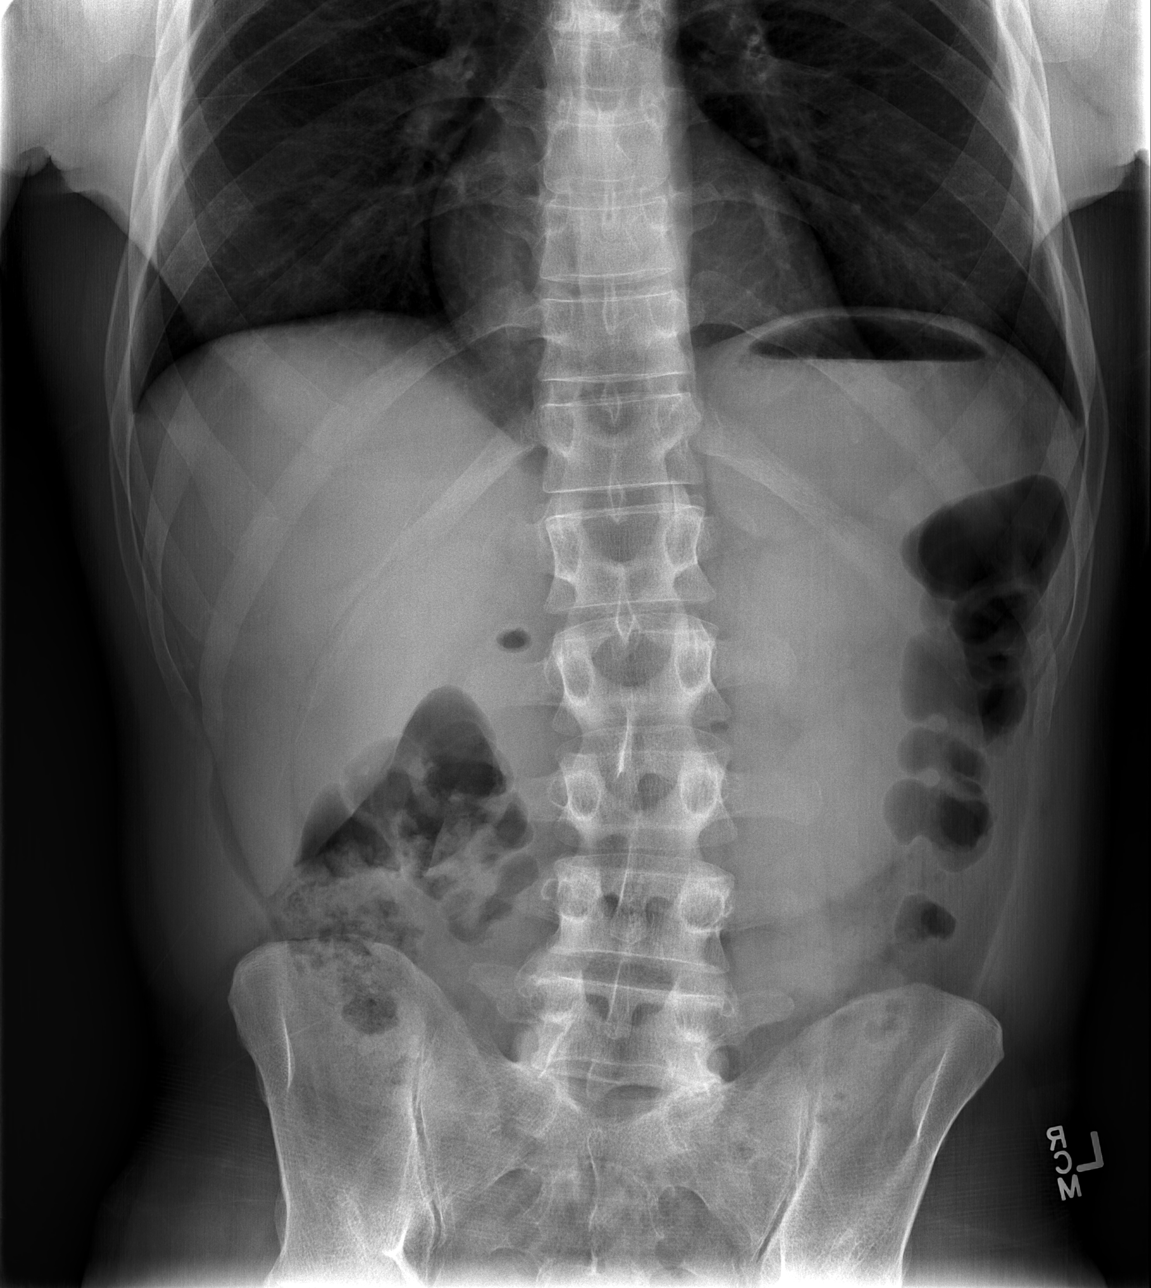

[t abdomen supine]
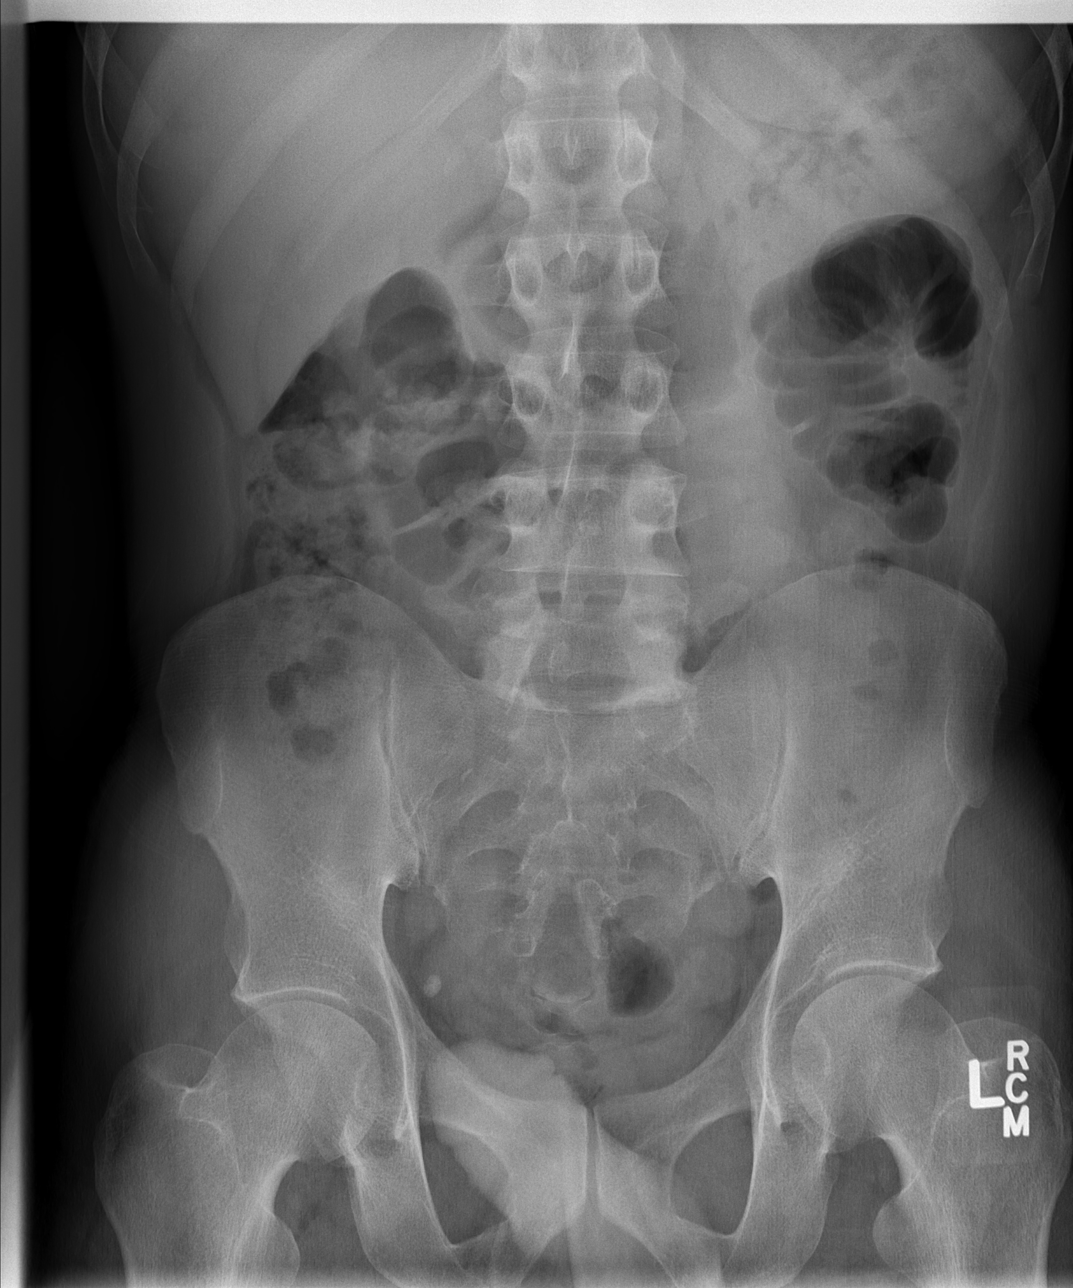

[3 of 3 positions shown; findings below may reference images not displayed]

FINDINGS: The bowel gas pattern is normal.  No evidence of dilated
bowel loops or air fluid levels.  No evidence of free air.  Right
pelvic phlebolith again noted.

The heart size and mediastinal contours are within normal limits.
Both lungs are clear.  No evidence of pleural effusion.  No mass or
adenopathy identified.
IMPRESSION: No acute findings.

## 2013-01-22 ENCOUNTER — Encounter (HOSPITAL_COMMUNITY): Payer: Self-pay | Admitting: Emergency Medicine

## 2013-01-22 ENCOUNTER — Emergency Department (HOSPITAL_COMMUNITY)
Admission: EM | Admit: 2013-01-22 | Discharge: 2013-01-22 | Disposition: A | Discharge status: Home / Self Care | Payer: Self-pay | Attending: Emergency Medicine | Admitting: Emergency Medicine

## 2013-01-22 DIAGNOSIS — T7492XA Unspecified child maltreatment, confirmed, initial encounter: Secondary | ICD-10-CM | POA: Insufficient documentation

## 2013-01-22 DIAGNOSIS — Z8719 Personal history of other diseases of the digestive system: Secondary | ICD-10-CM | POA: Insufficient documentation

## 2013-01-22 DIAGNOSIS — R42 Dizziness and giddiness: Secondary | ICD-10-CM | POA: Insufficient documentation

## 2013-01-22 DIAGNOSIS — S61509A Unspecified open wound of unspecified wrist, initial encounter: Secondary | ICD-10-CM | POA: Insufficient documentation

## 2013-01-22 DIAGNOSIS — Y929 Unspecified place or not applicable: Secondary | ICD-10-CM | POA: Insufficient documentation

## 2013-01-22 DIAGNOSIS — Z888 Allergy status to other drugs, medicaments and biological substances status: Secondary | ICD-10-CM | POA: Insufficient documentation

## 2013-01-22 DIAGNOSIS — I10 Essential (primary) hypertension: Secondary | ICD-10-CM | POA: Insufficient documentation

## 2013-01-22 DIAGNOSIS — F1010 Alcohol abuse, uncomplicated: Secondary | ICD-10-CM | POA: Insufficient documentation

## 2013-01-22 DIAGNOSIS — Y9389 Activity, other specified: Secondary | ICD-10-CM | POA: Insufficient documentation

## 2013-01-22 DIAGNOSIS — F1410 Cocaine abuse, uncomplicated: Secondary | ICD-10-CM | POA: Insufficient documentation

## 2013-01-22 DIAGNOSIS — T7491XA Unspecified adult maltreatment, confirmed, initial encounter: Secondary | ICD-10-CM | POA: Insufficient documentation

## 2013-01-22 DIAGNOSIS — S0003XA Contusion of scalp, initial encounter: Secondary | ICD-10-CM | POA: Insufficient documentation

## 2013-01-22 DIAGNOSIS — Z23 Encounter for immunization: Secondary | ICD-10-CM | POA: Insufficient documentation

## 2013-01-22 DIAGNOSIS — F17200 Nicotine dependence, unspecified, uncomplicated: Secondary | ICD-10-CM | POA: Insufficient documentation

## 2013-01-22 DIAGNOSIS — F3289 Other specified depressive episodes: Secondary | ICD-10-CM | POA: Insufficient documentation

## 2013-01-22 DIAGNOSIS — Z79899 Other long term (current) drug therapy: Secondary | ICD-10-CM | POA: Insufficient documentation

## 2013-01-22 DIAGNOSIS — J45909 Unspecified asthma, uncomplicated: Secondary | ICD-10-CM | POA: Insufficient documentation

## 2013-01-22 DIAGNOSIS — F329 Major depressive disorder, single episode, unspecified: Secondary | ICD-10-CM | POA: Insufficient documentation

## 2013-01-22 DIAGNOSIS — IMO0002 Reserved for concepts with insufficient information to code with codable children: Secondary | ICD-10-CM | POA: Insufficient documentation

## 2013-01-22 DIAGNOSIS — S61511A Laceration without foreign body of right wrist, initial encounter: Secondary | ICD-10-CM

## 2013-01-22 MED ORDER — TETANUS-DIPHTH-ACELL PERTUSSIS 5-2.5-18.5 LF-MCG/0.5 IM SUSP
0.5000 mL | Freq: Once | INTRAMUSCULAR | Status: AC
  Administered 2013-01-22: 0.5 mL via INTRAMUSCULAR
  Filled 2013-01-22: qty 0.5
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
## 2013-01-22 NOTE — ED Notes (Addendum)
[employer] present at [facility], pt c/o bilateral forehead pain, also some muscular neck pain, (denies: LOC, loose teeth, bloody nose, vomiting, malocclusion, sob, nv, dizziness or other sx), LS CTA, MAEx4, CMS & ROM intact, abd soft NT, PERRL 2mm, R wrist abrasion noted (cleaned with saline. bacitracin & bandaid applied). Reports lots of stress at home b/w he and his wife... housing, finances, loss of child).

## 2013-01-22 NOTE — ED Notes (Signed)
[employer] with pt  
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
## 2013-01-22 NOTE — ED Notes (Signed)
Given happy meal, pt up to b/r.

## 2013-01-30 ENCOUNTER — Ambulatory Visit: Payer: Self-pay | Admitting: Cardiology

## 2013-02-07 IMAGING — CR DG CHEST 1V PORT
1 series · 1 of 1 positions shown · non-contrast
Comparison: 08/13/2008

CLINICAL DATA: Pain.

PORTABLE CHEST - 1 VIEW

[AP]
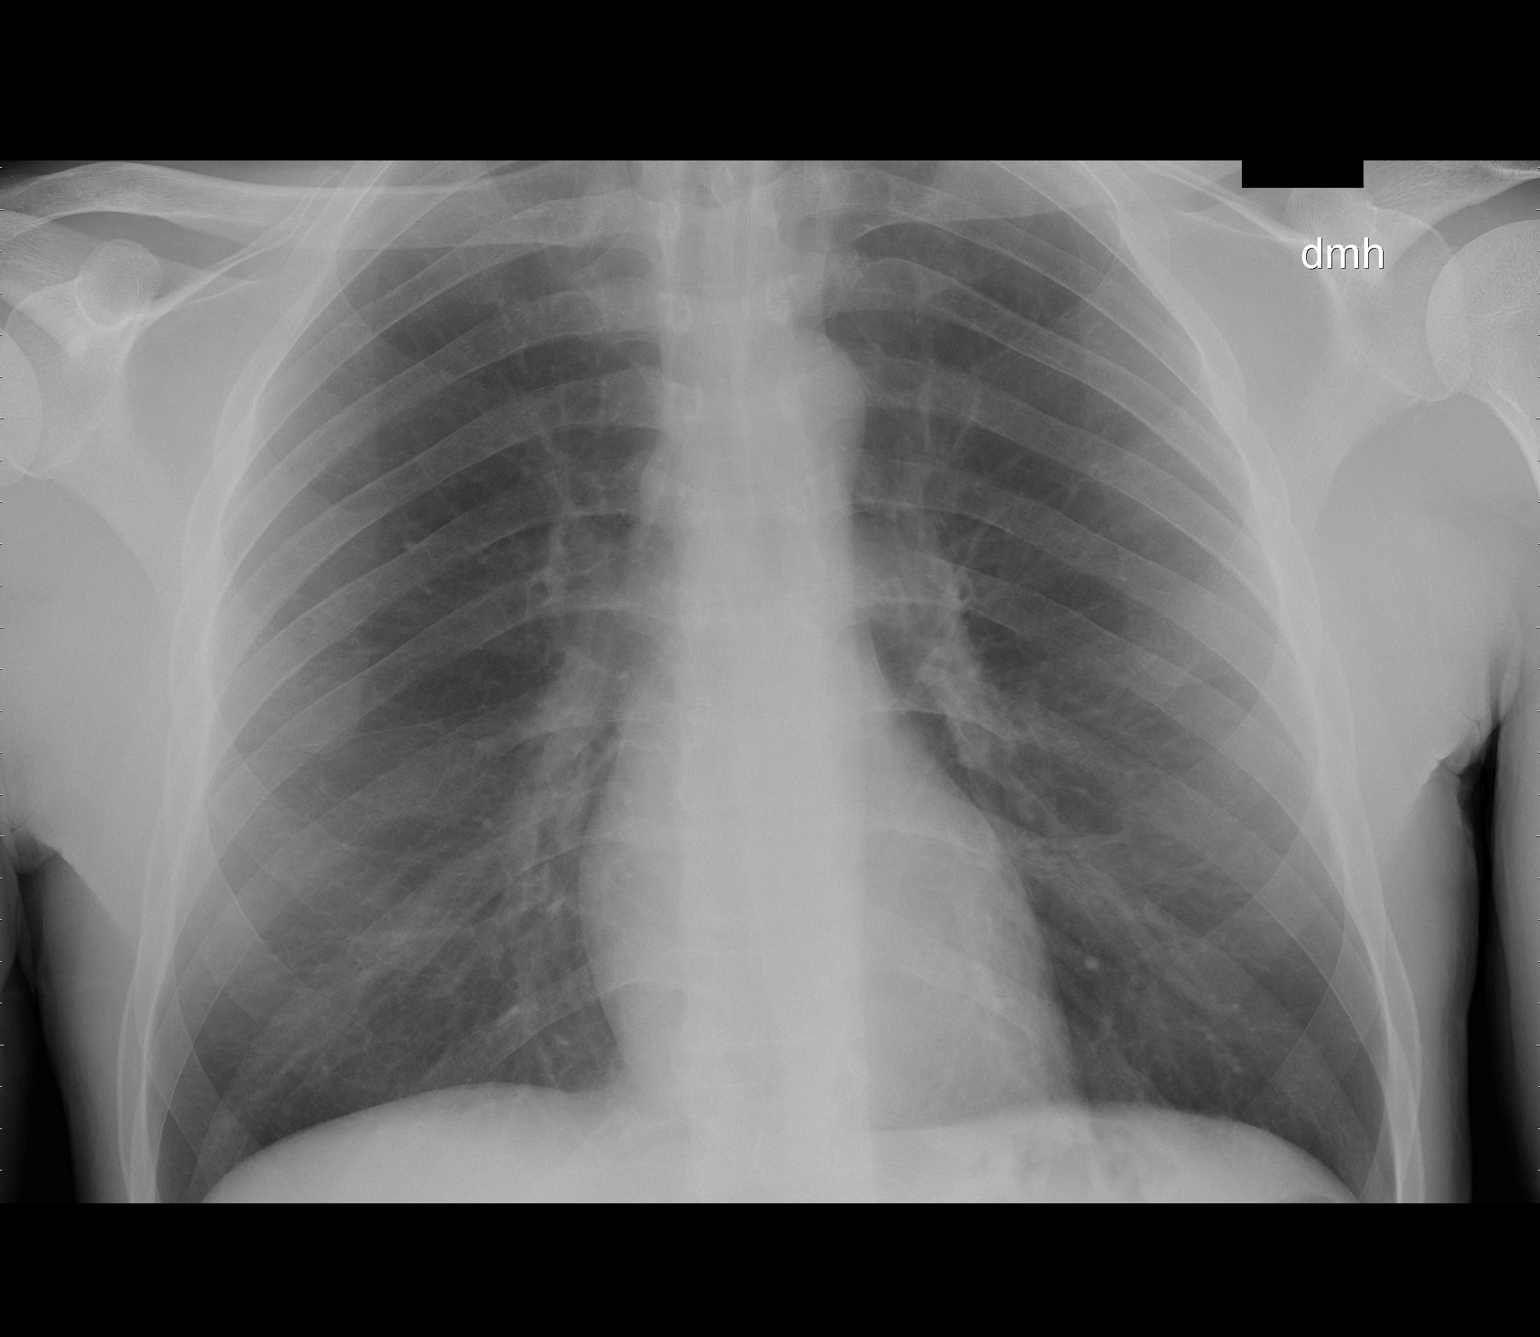

[1 of 1 positions shown; findings below may reference images not displayed]

FINDINGS: 5480 hours. The lungs are clear without focal infiltrate,
edema, pneumothorax or pleural effusion. The cardiopericardial
silhouette is within normal limits for size. Imaged bony structures
of the thorax are intact.
IMPRESSION: No acute cardiopulmonary process.

## 2013-02-27 ENCOUNTER — Ambulatory Visit: Payer: Self-pay | Admitting: Cardiology

## 2013-03-02 ENCOUNTER — Encounter (HOSPITAL_COMMUNITY): Payer: Self-pay | Admitting: Emergency Medicine

## 2013-03-02 ENCOUNTER — Emergency Department (HOSPITAL_COMMUNITY)
Admission: EM | Admit: 2013-03-02 | Discharge: 2013-03-03 | Disposition: A | Discharge status: Home / Self Care | Payer: Self-pay | Attending: Emergency Medicine | Admitting: Emergency Medicine

## 2013-03-02 DIAGNOSIS — F17200 Nicotine dependence, unspecified, uncomplicated: Secondary | ICD-10-CM | POA: Insufficient documentation

## 2013-03-02 DIAGNOSIS — F1010 Alcohol abuse, uncomplicated: Secondary | ICD-10-CM

## 2013-03-02 DIAGNOSIS — F1210 Cannabis abuse, uncomplicated: Secondary | ICD-10-CM | POA: Insufficient documentation

## 2013-03-02 DIAGNOSIS — F1510 Other stimulant abuse, uncomplicated: Secondary | ICD-10-CM | POA: Insufficient documentation

## 2013-03-02 DIAGNOSIS — F8089 Other developmental disorders of speech and language: Secondary | ICD-10-CM | POA: Insufficient documentation

## 2013-03-02 DIAGNOSIS — F1020 Alcohol dependence, uncomplicated: Secondary | ICD-10-CM | POA: Insufficient documentation

## 2013-03-02 DIAGNOSIS — F3289 Other specified depressive episodes: Secondary | ICD-10-CM | POA: Insufficient documentation

## 2013-03-02 DIAGNOSIS — Z8719 Personal history of other diseases of the digestive system: Secondary | ICD-10-CM | POA: Insufficient documentation

## 2013-03-02 DIAGNOSIS — F411 Generalized anxiety disorder: Secondary | ICD-10-CM | POA: Insufficient documentation

## 2013-03-02 DIAGNOSIS — J45909 Unspecified asthma, uncomplicated: Secondary | ICD-10-CM | POA: Insufficient documentation

## 2013-03-02 DIAGNOSIS — F329 Major depressive disorder, single episode, unspecified: Secondary | ICD-10-CM | POA: Insufficient documentation

## 2013-03-02 DIAGNOSIS — Z79899 Other long term (current) drug therapy: Secondary | ICD-10-CM | POA: Insufficient documentation

## 2013-03-02 DIAGNOSIS — IMO0002 Reserved for concepts with insufficient information to code with codable children: Secondary | ICD-10-CM | POA: Insufficient documentation

## 2013-03-02 DIAGNOSIS — I10 Essential (primary) hypertension: Secondary | ICD-10-CM | POA: Insufficient documentation

## 2013-03-02 DIAGNOSIS — R259 Unspecified abnormal involuntary movements: Secondary | ICD-10-CM | POA: Insufficient documentation

## 2013-03-02 DIAGNOSIS — R000 Tachycardia, unspecified: Secondary | ICD-10-CM | POA: Insufficient documentation

## 2013-03-02 HISTORY — DX: Personal history of self-harm: Z91.5

## 2013-03-02 LAB — POCT I-STAT TROPONIN I

## 2013-03-02 LAB — SALICYLATE LEVEL: Salicylate Lvl: <2 mg/dL — ABNORMAL LOW (ref 2.8–20.0)

## 2013-03-02 LAB — CBC
HCT: 36.1 % — ABNORMAL LOW (ref 39.0–52.0)
Hemoglobin: 12.9 g/dL — ABNORMAL LOW (ref 13.0–17.0)
MCH: 33 pg (ref 26.0–34.0)
MCHC: 35.7 g/dL (ref 30.0–36.0)
MCV: 92.3 fL (ref 78.0–100.0)
RDW: 13.5 % (ref 11.5–15.5)

## 2013-03-02 LAB — COMPREHENSIVE METABOLIC PANEL
ALT: 41 U/L (ref 0–53)
Albumin: 4.2 g/dL (ref 3.5–5.2)
BUN: 13 mg/dL (ref 6–23)
CO2: 23 mEq/L (ref 19–32)
Chloride: 101 mEq/L (ref 96–112)
Creatinine, Ser: 0.62 mg/dL (ref 0.50–1.35)
GFR calc Af Amer: >90 mL/min (ref >90)
GFR calc non Af Amer: >90 mL/min (ref >90)
Glucose, Bld: 105 mg/dL — ABNORMAL HIGH (ref 70–99)
Total Bilirubin: 1 mg/dL (ref 0.3–1.2)

## 2013-03-02 LAB — RAPID URINE DRUG SCREEN, HOSP PERFORMED
Amphetamines: POSITIVE — AB
Cocaine: NOT DETECTED
Opiates: NOT DETECTED

## 2013-03-02 LAB — ETHANOL: Alcohol, Ethyl (B): <11 mg/dL (ref 0–11)

## 2013-03-02 MED ORDER — IBUPROFEN 400 MG PO TABS
600.0000 mg | ORAL_TABLET | Freq: Three times a day (TID) | ORAL | Status: DC | PRN
  Administered 2013-03-03 (×3): 600 mg via ORAL
  Filled 2013-03-02 (×6): qty 1

## 2013-03-02 MED ORDER — LABETALOL HCL 5 MG/ML IV SOLN
5.0000 mg | Freq: Once | INTRAVENOUS | Status: AC
  Administered 2013-03-02: 5 mg via INTRAVENOUS
  Filled 2013-03-02: qty 4

## 2013-03-02 MED ORDER — LORAZEPAM 2 MG/ML IJ SOLN
2.0000 mg | Freq: Once | INTRAMUSCULAR | Status: AC
  Administered 2013-03-02: 2 mg via INTRAVENOUS
  Filled 2013-03-02: qty 1

## 2013-03-02 MED ORDER — LORAZEPAM 1 MG PO TABS: 0.0000 mg | ORAL_TABLET | Freq: Two times a day (BID) | ORAL | Status: DC

## 2013-03-02 MED ORDER — NICOTINE 21 MG/24HR TD PT24
21.0000 mg | MEDICATED_PATCH | Freq: Every day | TRANSDERMAL | Status: DC
  Administered 2013-03-02 – 2013-03-03 (×2): 21 mg via TRANSDERMAL
  Filled 2013-03-02: qty 1

## 2013-03-02 MED ORDER — LORAZEPAM 2 MG/ML IJ SOLN
1.0000 mg | Freq: Once | INTRAMUSCULAR | Status: AC
  Administered 2013-03-02: 1 mg via INTRAVENOUS
  Filled 2013-03-02: qty 1

## 2013-03-02 MED ORDER — ALUM & MAG HYDROXIDE-SIMETH 200-200-20 MG/5ML PO SUSP
30.0000 mL | ORAL | Status: DC | PRN
  Administered 2013-03-02: 30 mL via ORAL
  Filled 2013-03-02: qty 30

## 2013-03-02 MED ORDER — ONDANSETRON HCL 4 MG PO TABS: 4.0000 mg | ORAL_TABLET | Freq: Three times a day (TID) | ORAL | Status: DC | PRN

## 2013-03-02 MED ORDER — SODIUM CHLORIDE 0.9 % IV BOLUS (SEPSIS)
1000.0000 mL | Freq: Once | INTRAVENOUS | Status: AC
  Administered 2013-03-02: 1000 mL via INTRAVENOUS

## 2013-03-02 MED ORDER — QUETIAPINE FUMARATE ER 50 MG PO TB24
150.0000 mg | ORAL_TABLET | Freq: Every day | ORAL | Status: DC
  Administered 2013-03-02: 150 mg via ORAL
  Filled 2013-03-02 (×2): qty 3

## 2013-03-02 MED ORDER — LORAZEPAM 1 MG PO TABS
0.0000 mg | ORAL_TABLET | Freq: Four times a day (QID) | ORAL | Status: DC
  Administered 2013-03-02 – 2013-03-03 (×3): 1 mg via ORAL
  Filled 2013-03-02 (×3): qty 1

## 2013-03-02 MED ORDER — NICOTINE 21 MG/24HR TD PT24
21.0000 mg | MEDICATED_PATCH | Freq: Once | TRANSDERMAL | Status: DC
  Administered 2013-03-02: 21 mg via TRANSDERMAL
  Filled 2013-03-02: qty 1

## 2013-03-02 MED ORDER — PANTOPRAZOLE SODIUM 40 MG PO TBEC
40.0000 mg | DELAYED_RELEASE_TABLET | Freq: Every day | ORAL | Status: DC
  Administered 2013-03-02 – 2013-03-03 (×2): 40 mg via ORAL
  Filled 2013-03-02 (×2): qty 1

## 2013-03-02 MED ORDER — MORPHINE SULFATE 4 MG/ML IJ SOLN
4.0000 mg | Freq: Once | INTRAMUSCULAR | Status: AC
  Administered 2013-03-02: 4 mg via INTRAVENOUS
  Filled 2013-03-02: qty 1

## 2013-03-02 MED ORDER — CLONIDINE HCL 0.2 MG PO TABS
0.2000 mg | ORAL_TABLET | Freq: Two times a day (BID) | ORAL | Status: DC
  Administered 2013-03-02 – 2013-03-03 (×3): 0.2 mg via ORAL
  Filled 2013-03-02 (×3): qty 1

## 2013-03-02 MED ORDER — ZOLPIDEM TARTRATE 5 MG PO TABS
5.0000 mg | ORAL_TABLET | Freq: Every evening | ORAL | Status: DC | PRN
  Filled 2013-03-02: qty 1

## 2013-03-02 MED ORDER — KETOROLAC TROMETHAMINE 30 MG/ML IJ SOLN
15.0000 mg | Freq: Once | INTRAMUSCULAR | Status: AC
  Administered 2013-03-02: 15 mg via INTRAVENOUS
  Filled 2013-03-02: qty 1

## 2013-03-02 MED ORDER — ALBUTEROL SULFATE HFA 108 (90 BASE) MCG/ACT IN AERS: 2.0000 | INHALATION_SPRAY | Freq: Four times a day (QID) | RESPIRATORY_TRACT | Status: DC | PRN

## 2013-03-02 MED ORDER — ADULT MULTIVITAMIN W/MINERALS CH
1.0000 | ORAL_TABLET | Freq: Every day | ORAL | Status: DC
  Administered 2013-03-02 – 2013-03-03 (×2): 1 via ORAL
  Filled 2013-03-02 (×2): qty 1

## 2013-03-02 NOTE — ED Notes (Addendum)
Call Domingo Madeira -PT Mother at time of D/C.  [phone]  Or call room [phone] . Mother is with another family member at [facility].

## 2013-03-02 NOTE — ED Provider Notes (Signed)
Medical screening examination/treatment/procedure(s) were performed by non-physician practitioner and as supervising physician I was immediately available for consultation/collaboration.    Devoria Albe, MD, FACEP   Ward Givens, MD 03/02/13 1800
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
## 2013-03-02 NOTE — ED Notes (Signed)
TTS completed. 

## 2013-03-02 NOTE — Progress Notes (Signed)
The following facilities were contacted regarding placement:  [facility]- per Maralyn Sago they are holding off on admissions tonight but can call tomorrow am (10.26)  [facility]- per Tia they do have bed availability, referral faxed [facility] per Lanora Manis, at capacity [facility]- per [place] referral could be faxed d/t bed being available, referral faxed [facility] per Florentina Addison, bed availability, referral faxed [facility] bed availability per Elita Quick, referral faxed [facility]- per Zella Ball, bed availability, referral faxed  Tomi Bamberger, MHT

## 2013-03-02 NOTE — ED Notes (Signed)
[facility] in room at this time.  °

## 2013-03-02 NOTE — ED Notes (Addendum)
Pt is tearful states he has felt depressed for the past week and has been unable to sleep or eat. Denies HI/SI. Pt reports he uses alcohol and marijuana at times and did use those substances yesterday. A&Ox4, resp e/u. Has been out of BP meds. States he is having relationship problems with his girlfriend and that's when all this started.

## 2013-03-02 NOTE — ED Notes (Signed)
PT placed in Blue scrubs and waded by Security.
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
## 2013-03-03 ENCOUNTER — Encounter (HOSPITAL_COMMUNITY): Payer: Self-pay | Admitting: Emergency Medicine

## 2013-03-03 MED ORDER — QUETIAPINE FUMARATE ER 150 MG PO TB24: 150.0000 mg | ORAL_TABLET | Freq: Every day | ORAL | Status: DC

## 2013-03-03 MED ORDER — NICOTINE 21 MG/24HR TD PT24: 21.0000 mg | MEDICATED_PATCH | Freq: Every day | TRANSDERMAL | Status: DC

## 2013-03-03 MED ORDER — CLONIDINE HCL 0.2 MG PO TABS: 0.2000 mg | ORAL_TABLET | Freq: Two times a day (BID) | ORAL | Status: DC

## 2013-03-03 MED ORDER — OMEPRAZOLE 20 MG PO CPDR: 20.0000 mg | DELAYED_RELEASE_CAPSULE | Freq: Every day | ORAL | Status: DC

## 2013-03-03 MED ORDER — ALBUTEROL SULFATE HFA 108 (90 BASE) MCG/ACT IN AERS: 2.0000 | INHALATION_SPRAY | Freq: Four times a day (QID) | RESPIRATORY_TRACT | Status: DC | PRN

## 2013-03-03 MED ORDER — NICOTINE 21 MG/24HR TD PT24: 21.0000 mg | MEDICATED_PATCH | Freq: Once | TRANSDERMAL | Status: DC

## 2013-03-03 NOTE — ED Notes (Signed)
CIWA scale not performed at this moment. Pt sleeping comfortably.

## 2013-03-03 NOTE — Progress Notes (Addendum)
[facility] contacted d/t pt's request for detox, per Maralyn Sago they do have a bed available at which time Prescreen process was started. Phone call was placed to Luster Landsberg' at [facility] and auth# [id number] was given for 3 days (10.26, 10.27, 10.28).  Referral and Prescreen packet has been faxed.  Confirmation from Sarah at [facility] that Prescreen and referral paper work was received @ 1308, but had not gotten a chance to review.  Tomi Bamberger, MHT
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
## 2013-03-03 NOTE — ED Notes (Signed)
Pt moved into pod C and pt noticed the room has a camera. The pt got up from his bed and removed the clear, plastic covering from camera and placed jumbled up paper towels and replaced the clear plastic covering. This RN removed paper towels and told patient he could not do that.
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
## 2013-03-03 NOTE — Progress Notes (Addendum)
Nicole from [facility] called back stating that pt had been accepted, pt's RN Kriste Basque) was notified as well as [facility] .  [facility] transportation will be notified of need for transport, at time of pick up RN will call [facility] [phone]) to make them aware that pt is in route.  Update: Pt came to both RN Horticulturist, commercial) and this Clinical research associate to express his concern about an appointment with his probation officer tomorrow [phone]. 10.27) and no longer wanting to go to [facility] for detox after being notified that he had been accepted.  Pt then began asking why he could not go to [facility] and stated that someone told him he couldn't go there anymore, this writer explained to him that even if he went to [facility] that would not fix his problem he had as far as seeing his probation officer tomorrow because they [facility]) do not allow pt's to leave for appointments either.    An attempt was made to contact pt's probation officer(Pamela Shawnie Pons [phone] ext. [phone]) to make her aware of pt having a bed available but there was no answer so a message was left.  Pt still felt uneasy about going to [facility] without meeting with her first sating, "I could be facing 7 years if I go there and then she gets mad about it", and still insisted on going home.  [facility] was then called, spoke with Maralyn Sago and made her aware of pt's concern and that he no longer was coming for treatment d/t circumstances described above.  Tomi Bamberger, MHT

## 2013-03-03 NOTE — Discharge Instructions (Signed)
 Chemical Dependency Chemical dependency is an addiction to drugs or alcohol . It is characterized by the repeated behavior of seeking out and using drugs and alcohol  despite harmful consequences to the health and safety of ones self and others.  RISK FACTORS There are certain situations or behaviors that increase a person's risk for chemical dependency. These include:  A family history of chemical dependency.  A history of mental health issues, including depression and anxiety.  A home environment where drugs and alcohol  are easily available to you.  Drug or alcohol  use at a young age. SYMPTOMS  The following symptoms can indicate chemical dependency:  Inability to limit the use of drugs or alcohol .  Nausea, sweating, shakiness, and anxiety that occurs when alcohol  or drugs are not being used.  An increase in amount of drugs or alcohol  that is necessary to get drunk or high. People who experience these symptoms can assess their use of drugs and alcohol  by asking themselves the following questions:  Have you been told by friends or family that they are worried about your use of alcohol  or drugs?  Do friends and family ever tell you about things you did while drinking alcohol  or using drugs that you do not remember?  Do you lie about using alcohol  or drugs or about the amounts you use?  Do you have difficulty completing daily tasks unless you use alcohol  or drugs?  Is the level of your work or school performance lower because of your drug or alcohol  use?  Do you get sick from using drugs or alcohol  but keep using anyway?  Do you feel uncomfortable in social situations unless you use alcohol  or drugs?  Do you use drugs or alcohol  to help forget problems? An answer of yes to any of these questions may indicate chemical dependency. Professional evaluation is suggested. Document Released: 04/19/2001 Document Revised: 07/18/2011 Document Reviewed: 07/01/2010 [facility] Patient  Information 2014 [place], [place].     RESOURCE GUIDE  Chronic Pain Problems: Contact [facility]  [phone] Patients need to be referred by their primary care doctor.  Insufficient Money for Medicine: Contact United Way:  call [phone]  No Primary Care Doctor: - Call Health Connect  [phone] - can help you locate a primary care doctor that  accepts your insurance, provides certain services, etc. - Physician Referral [phone]  Agencies that provide inexpensive medical care: - [name]  [phone] - [name]  [phone] - [facility]  [phone] - Women's Clinic  [phone] - Planned Parenthood  [phone] [facility]  [phone]  Medicaid-accepting [facility] Providers: - [facility]- [address], [address]  [phone], Mon-Fri 9am-7pm, Sat 9am-1pm - [facility]- [address] [place], Suite [place]  [phone] - [facility]- [address], [address] [place]  [phone] [facility]- [address]  [phone] - Kennieth Leech- [address] [place], [address], [phone]  Only accepts [place] patients after they have their name  applied to their card  Self Pay (no insurance) in [place]: - Sickle Cell Patients - [facility]  [address] [place], [phone] - [facility]- [address] [place]  [phone]       [name] [place]- [address]       -  [facility]- see information above (Speak to [employer] if you do not have insurance)       -  [facility]- [address],  [phone]       -  [facility]- [address], [phone]       -  Dr Catalina-  [address], [address], [place], [phone]       -  [facility] - [address], [phone]       -  [facility]- [address], [phone], also [address] [phone]       -     [facility]- [address] [place], [phone], 1st & 3rd Saturday         every month, 10am-1pm  -     [name]   [address], [place].   Phone:  [phone], Fax:  [phone]. Hours of Operation:  9 am - 6 pm, M-F.  -     [facility]   [address], [place]   Phone: [phone], Fax: [phone]. Hours of Operation:  8:30 am - 5:30 pm, M-F.  [facility] [address] [place], [place] [address] [phone]  [facility] [address] [place], [place] [address] [phone]  1) Find a Doctor and Pay Out of Pocket Although you won't have to find out who is covered by your insurance plan, it is a good idea to ask around and get recommendations. You will then need to call the office and see if the doctor you have chosen will accept you as a new patient and what types of options they offer for patients who are self-pay. Some doctors offer discounts or will set up payment plans for their patients who do not have insurance, but you will need to ask so you aren't surprised when you get to your appointment.  2) Contact Your Local Health Department Not all health departments have doctors that can see patients for sick visits, but many do, so it is worth a call to see if yours does. If you don't know where your local health department is, you can check in your phone book. The CDC also has a tool to help you locate your state's health department, and many state websites also have listings of all of their local health departments.  3) Find a Walk-in Clinic If your illness is not likely to be very severe or complicated, you may want to try a walk in clinic. These are popping up all over the country in pharmacies, drugstores, and shopping centers. They're usually staffed by nurse  practitioners or physician assistants that have been trained to treat common illnesses and complaints. They're usually fairly quick and inexpensive. However, if you have serious medical issues or chronic medical problems, these are probably not your best option  STD Testing - [facility] Department of [facility], STD Clinic, [address], [place], phone [phone] or [phone].  Monday - Friday, call for an appointment. [facility] Department of [employer], STD Clinic, [address], [place], phone [phone] or [phone].  Monday - Friday, call for an appointment.  Abuse/Neglect: [facility] Child Abuse Hotline ([phone] [facility] Child Abuse Hotline (364)611-1822 (After Hours)  Emergency Shelter:  [facility] [phone]  Maternity  Homes: - Room at the [place] [facility] [phone] - [facility] [phone]  MRSA Hotline #:   918-437-7009  Dental Assistance If unable to pay or uninsured, contact:  [facility]. to become qualified for the adult dental clinic.  Patients with Medicaid: [facility] [phone] [address], [phone] [address], [phone]  If unable to pay, or uninsured, contact [facility] [phone] in [place], [phone] in [facility]) to become qualified for the adult dental clinic  [facility] [address] [place], [place] [address] [phone] [email or web address]  Other Proofreader Services: - [facility]- [address] [place], [place], [place], [address], [phone], Ext. [phone], 2nd and 4th Thursday of the month at 6:30am.  10 clients each day by appointment, can sometimes see walk-in patients if someone does not show for an appointment. [facility]- [address] [place], [place], [address], [phone] - [facility] [address], [place], [place], [address], [phone] - [place]- [phone] [facility] Health Department- [phone] [facility] Health [phone]       Behavioral Health Resources in the [facility]  Intensive Outpatient Programs: [facility]      [address] [place], [place] [phone] Both a day and evening program       [name] Outpatient     [address]        [place], [place] [address] [phone]         ADS: Alcohol  & Drug S[facility]cs [address] [place] [phone]  [facility] Mental Health ACCESS LINE: [phone] or [phone] [address] [place], [place] [address] [email or web address]   Substance Abuse Resources: - Alcohol  and Drug Services  [phone] - [facility] [phone] - [facility] [phone] [facility] [place] [phone] - Residential & Outpatient Substance Abuse Program  [phone]  Psychological Services: [facility]  [phone] Services  [phone] - [facility], [phone] [place]. [address], [place], ACCESS LINE: ([phone] or [phone], [email or web address]  Mobile Crisis Teams:                                        Therapeutic Alternatives         Mobile Crisis Care Unit [phone]             [facility] [address] [place] [phone]                                         Interventionist [address] [place] [address] [place] [phone]  Self-Help/Support Groups: Mental Health Assoc. of [employer] of support groups ([phone] (call for more info)  Narcotics Anonymous (NA) Caring Services [address] [place] - 2 meetings at this location  Residential Treatment Programs:  [facility]      [address]        [place]       [phone]         [facility] [address] [place] 892881 [place], [place]  [address] [phone]  [facility]  [address] [place]  [place], [place] [address] [phone] Admissions: 8am-3pm M-F  [facility]     [address]        [place], [place] [address]       [phone]         [facility] [address] [place], [place] [phone]  [facility] [address] [place], [place] [phone]  [facility] - Intensive Outpatient      [address]     [place], [place]       [phone]         [facility] (Addiction Recovery Care Assoc.)     [address] [place], [place] [phone] or [phone]  Residential Treatment Services (RTS), Medicaid [address] [place], [place] [phone]  Fellowship [address]                                               [address] [place] [phone]  [facility] Resources: [phone]               General Therapy                                                Mliss Rival, PhD        [address] [place], [place] [address]         [phone]   Insurance  [facility]al   [address] [place], [place] [address] [phone]  [facility] [address] [place], [place] [address] [phone] Insurance/Medicaid/sponsorship through [facility] and Families                                              [address]                                        [place], [place] [address]    Therapy/tele-psych/case         [phone]          [facility] [address] [place], [place]  [address]  Adolescent/group home/case management [phone]                                           Recardo Rival PhD       General therapy       Insurance   [phone]         Dr.  Curry, [place], M-F [phone]  [facility]  [facility]. [address]  [address]         [address] [place] [address]  Tinnie Keenan Keen[place] Phone:  [phone]                                  Phone:  [phone]                   Phone:  [phone]  [facility], [phone] - [facility] - [facility]- [phone]       -     [facility] in [place], [address],             [phone], Insurance  [place] Child Abuse Hotline (605)472-6387 or [phone] (After Hours)

## 2013-03-06 ENCOUNTER — Emergency Department (HOSPITAL_COMMUNITY): Payer: Self-pay

## 2013-03-06 ENCOUNTER — Encounter (HOSPITAL_COMMUNITY): Payer: Self-pay | Admitting: Emergency Medicine

## 2013-03-06 ENCOUNTER — Observation Stay (HOSPITAL_COMMUNITY)
Admission: EM | Admit: 2013-03-06 | Discharge: 2013-03-07 | Disposition: A | Discharge status: Home / Self Care | Payer: Self-pay | Attending: Cardiovascular Disease | Admitting: Cardiovascular Disease

## 2013-03-06 DIAGNOSIS — R0789 Other chest pain: Principal | ICD-10-CM | POA: Insufficient documentation

## 2013-03-06 DIAGNOSIS — Z79899 Other long term (current) drug therapy: Secondary | ICD-10-CM | POA: Insufficient documentation

## 2013-03-06 DIAGNOSIS — I456 Pre-excitation syndrome: Secondary | ICD-10-CM

## 2013-03-06 DIAGNOSIS — J449 Chronic obstructive pulmonary disease, unspecified: Secondary | ICD-10-CM | POA: Insufficient documentation

## 2013-03-06 DIAGNOSIS — R079 Chest pain, unspecified: Secondary | ICD-10-CM

## 2013-03-06 DIAGNOSIS — F1210 Cannabis abuse, uncomplicated: Secondary | ICD-10-CM | POA: Insufficient documentation

## 2013-03-06 DIAGNOSIS — R9431 Abnormal electrocardiogram [ECG] [EKG]: Secondary | ICD-10-CM

## 2013-03-06 DIAGNOSIS — F1410 Cocaine abuse, uncomplicated: Secondary | ICD-10-CM | POA: Insufficient documentation

## 2013-03-06 DIAGNOSIS — F17200 Nicotine dependence, unspecified, uncomplicated: Secondary | ICD-10-CM | POA: Insufficient documentation

## 2013-03-06 DIAGNOSIS — I10 Essential (primary) hypertension: Secondary | ICD-10-CM | POA: Insufficient documentation

## 2013-03-06 DIAGNOSIS — F1010 Alcohol abuse, uncomplicated: Secondary | ICD-10-CM | POA: Insufficient documentation

## 2013-03-06 DIAGNOSIS — J4489 Other specified chronic obstructive pulmonary disease: Secondary | ICD-10-CM | POA: Insufficient documentation

## 2013-03-06 HISTORY — DX: Anxiety disorder, unspecified: F41.9

## 2013-03-06 HISTORY — DX: Personal history of other diseases of the digestive system: Z87.19

## 2013-03-06 HISTORY — DX: Gastro-esophageal reflux disease without esophagitis: K21.9

## 2013-03-06 HISTORY — DX: Cardiac murmur, unspecified: R01.1

## 2013-03-06 HISTORY — DX: Anemia, unspecified: D64.9

## 2013-03-06 HISTORY — DX: Pre-excitation syndrome: I45.6

## 2013-03-06 HISTORY — DX: Shortness of breath: R06.02

## 2013-03-06 HISTORY — DX: Unspecified osteoarthritis, unspecified site: M19.90

## 2013-03-06 HISTORY — DX: Unspecified convulsions: R56.9

## 2013-03-06 LAB — HEPATIC FUNCTION PANEL
ALT: 36 U/L (ref 0–53)
AST: 40 U/L — ABNORMAL HIGH (ref 0–37)
Alkaline Phosphatase: 75 U/L (ref 39–117)
Bilirubin, Direct: 0.1 mg/dL (ref 0.0–0.3)
Indirect Bilirubin: 0.3 mg/dL (ref 0.3–0.9)
Total Protein: 7.6 g/dL (ref 6.0–8.3)

## 2013-03-06 LAB — TROPONIN I: Troponin I: <0.3 ng/mL (ref <0.30)

## 2013-03-06 LAB — URINALYSIS, ROUTINE W REFLEX MICROSCOPIC
Bilirubin Urine: NEGATIVE
Glucose, UA: NEGATIVE mg/dL
Hgb urine dipstick: NEGATIVE
Specific Gravity, Urine: 1.017 (ref 1.005–1.030)
pH: 6 (ref 5.0–8.0)

## 2013-03-06 LAB — CBC
HCT: 33.2 % — ABNORMAL LOW (ref 39.0–52.0)
Hemoglobin: 11.3 g/dL — ABNORMAL LOW (ref 13.0–17.0)
MCV: 94.1 fL (ref 78.0–100.0)
RBC: 3.53 MIL/uL — ABNORMAL LOW (ref 4.22–5.81)
WBC: 8.6 10*3/uL (ref 4.0–10.5)

## 2013-03-06 LAB — BASIC METABOLIC PANEL
BUN: 9 mg/dL (ref 6–23)
CO2: 26 mEq/L (ref 19–32)
Chloride: 101 mEq/L (ref 96–112)
Creatinine, Ser: 0.71 mg/dL (ref 0.50–1.35)
GFR calc Af Amer: >90 mL/min (ref >90)
GFR calc non Af Amer: >90 mL/min (ref >90)
Potassium: 3.8 mEq/L (ref 3.5–5.1)

## 2013-03-06 LAB — RAPID URINE DRUG SCREEN, HOSP PERFORMED
Amphetamines: NOT DETECTED
Cocaine: NOT DETECTED
Opiates: NOT DETECTED
Tetrahydrocannabinol: POSITIVE — AB

## 2013-03-06 LAB — POCT I-STAT TROPONIN I

## 2013-03-06 MED ORDER — ACETAMINOPHEN 325 MG PO TABS
650.0000 mg | ORAL_TABLET | Freq: Once | ORAL | Status: AC
Start: 2013-03-06 — End: 2013-03-06
  Administered 2013-03-06: 650 mg via ORAL
  Filled 2013-03-06: qty 2

## 2013-03-06 MED ORDER — SODIUM CHLORIDE 0.9 % IV SOLN: 250.0000 mL | INTRAVENOUS | Status: DC | PRN

## 2013-03-06 MED ORDER — ONDANSETRON HCL 4 MG/2ML IJ SOLN: 4.0000 mg | Freq: Four times a day (QID) | INTRAMUSCULAR | Status: DC | PRN

## 2013-03-06 MED ORDER — DIPHENHYDRAMINE HCL 50 MG/ML IJ SOLN
25.0000 mg | INTRAMUSCULAR | Status: AC
  Administered 2013-03-07: 25 mg via INTRAVENOUS
  Filled 2013-03-06: qty 1

## 2013-03-06 MED ORDER — ASPIRIN EC 81 MG PO TBEC: 81.0000 mg | DELAYED_RELEASE_TABLET | Freq: Every day | ORAL | Status: DC

## 2013-03-06 MED ORDER — FAMOTIDINE 20 MG PO TABS
20.0000 mg | ORAL_TABLET | ORAL | Status: AC
  Administered 2013-03-06: 20 mg via ORAL
  Filled 2013-03-06: qty 1

## 2013-03-06 MED ORDER — ACETAMINOPHEN 325 MG PO TABS
650.0000 mg | ORAL_TABLET | ORAL | Status: DC | PRN
  Filled 2013-03-06: qty 2

## 2013-03-06 MED ORDER — LORAZEPAM 2 MG/ML IJ SOLN: 1.0000 mg | Freq: Four times a day (QID) | INTRAMUSCULAR | Status: DC | PRN

## 2013-03-06 MED ORDER — NITROGLYCERIN 0.4 MG SL SUBL: 0.4000 mg | SUBLINGUAL_TABLET | SUBLINGUAL | Status: DC | PRN

## 2013-03-06 MED ORDER — QUETIAPINE FUMARATE ER 50 MG PO TB24
150.0000 mg | ORAL_TABLET | Freq: Every day | ORAL | Status: DC
  Administered 2013-03-06: 150 mg via ORAL
  Filled 2013-03-06 (×3): qty 3

## 2013-03-06 MED ORDER — ALBUTEROL SULFATE HFA 108 (90 BASE) MCG/ACT IN AERS: 2.0000 | INHALATION_SPRAY | Freq: Four times a day (QID) | RESPIRATORY_TRACT | Status: DC | PRN

## 2013-03-06 MED ORDER — NITROGLYCERIN 2 % TD OINT
1.0000 [in_us] | TOPICAL_OINTMENT | Freq: Once | TRANSDERMAL | Status: AC
  Administered 2013-03-06: 1 [in_us] via TOPICAL
  Filled 2013-03-06: qty 1

## 2013-03-06 MED ORDER — SODIUM CHLORIDE 0.9 % IV SOLN
1.0000 mL/kg/h | INTRAVENOUS | Status: DC
  Administered 2013-03-06: 1 mL/kg/h via INTRAVENOUS

## 2013-03-06 MED ORDER — ATORVASTATIN CALCIUM 10 MG PO TABS
10.0000 mg | ORAL_TABLET | Freq: Every day | ORAL | Status: DC
  Administered 2013-03-07: 10 mg via ORAL
  Filled 2013-03-06: qty 1

## 2013-03-06 MED ORDER — SODIUM CHLORIDE 0.9 % IJ SOLN: 3.0000 mL | Freq: Two times a day (BID) | INTRAMUSCULAR | Status: DC

## 2013-03-06 MED ORDER — MORPHINE SULFATE 2 MG/ML IJ SOLN
1.0000 mg | INTRAMUSCULAR | Status: DC | PRN
  Administered 2013-03-06 (×2): 2 mg via INTRAVENOUS
  Filled 2013-03-06 (×2): qty 1

## 2013-03-06 MED ORDER — LORAZEPAM 1 MG PO TABS: 0.0000 mg | ORAL_TABLET | Freq: Two times a day (BID) | ORAL | Status: DC

## 2013-03-06 MED ORDER — ASPIRIN 81 MG PO CHEW: 324.0000 mg | CHEWABLE_TABLET | ORAL | Status: AC

## 2013-03-06 MED ORDER — THIAMINE HCL 100 MG/ML IJ SOLN
100.0000 mg | Freq: Every day | INTRAMUSCULAR | Status: DC
  Filled 2013-03-06: qty 1

## 2013-03-06 MED ORDER — METOPROLOL TARTRATE 25 MG PO TABS
25.0000 mg | ORAL_TABLET | Freq: Two times a day (BID) | ORAL | Status: DC
  Administered 2013-03-06 – 2013-03-07 (×2): 25 mg via ORAL
  Filled 2013-03-06 (×3): qty 1

## 2013-03-06 MED ORDER — FOLIC ACID 1 MG PO TABS
1.0000 mg | ORAL_TABLET | Freq: Every day | ORAL | Status: DC
  Administered 2013-03-06 – 2013-03-07 (×2): 1 mg via ORAL
  Filled 2013-03-06 (×2): qty 1

## 2013-03-06 MED ORDER — PANTOPRAZOLE SODIUM 40 MG PO TBEC
40.0000 mg | DELAYED_RELEASE_TABLET | Freq: Every day | ORAL | Status: DC
  Administered 2013-03-07: 40 mg via ORAL
  Filled 2013-03-06: qty 1

## 2013-03-06 MED ORDER — LORAZEPAM 1 MG PO TABS: 1.0000 mg | ORAL_TABLET | Freq: Four times a day (QID) | ORAL | Status: DC | PRN

## 2013-03-06 MED ORDER — ASPIRIN 300 MG RE SUPP: 300.0000 mg | RECTAL | Status: AC

## 2013-03-06 MED ORDER — HEPARIN (PORCINE) IN NACL 100-0.45 UNIT/ML-% IJ SOLN
900.0000 [IU]/h | INTRAMUSCULAR | Status: DC
  Administered 2013-03-06: 750 [IU]/h via INTRAVENOUS
  Filled 2013-03-06 (×2): qty 250

## 2013-03-06 MED ORDER — CLONIDINE HCL 0.2 MG PO TABS
0.2000 mg | ORAL_TABLET | Freq: Two times a day (BID) | ORAL | Status: DC
  Administered 2013-03-06 – 2013-03-07 (×2): 0.2 mg via ORAL
  Filled 2013-03-06 (×3): qty 1

## 2013-03-06 MED ORDER — ASPIRIN 81 MG PO CHEW
81.0000 mg | CHEWABLE_TABLET | ORAL | Status: AC
  Administered 2013-03-07: 81 mg via ORAL
  Filled 2013-03-06: qty 1

## 2013-03-06 MED ORDER — DIAZEPAM 5 MG PO TABS
5.0000 mg | ORAL_TABLET | ORAL | Status: AC
  Administered 2013-03-07: 5 mg via ORAL
  Filled 2013-03-06: qty 1

## 2013-03-06 MED ORDER — METHYLPREDNISOLONE SODIUM SUCC 125 MG IJ SOLR
125.0000 mg | INTRAMUSCULAR | Status: AC
  Administered 2013-03-07: 125 mg via INTRAVENOUS
  Filled 2013-03-06 (×2): qty 2

## 2013-03-06 MED ORDER — SODIUM CHLORIDE 0.9 % IJ SOLN: 3.0000 mL | INTRAMUSCULAR | Status: DC | PRN

## 2013-03-06 MED ORDER — LORAZEPAM 1 MG PO TABS
0.0000 mg | ORAL_TABLET | Freq: Four times a day (QID) | ORAL | Status: DC
  Administered 2013-03-06 – 2013-03-07 (×3): 1 mg via ORAL
  Filled 2013-03-06 (×3): qty 1

## 2013-03-06 MED ORDER — ADULT MULTIVITAMIN W/MINERALS CH
1.0000 | ORAL_TABLET | Freq: Every day | ORAL | Status: DC
  Administered 2013-03-06 – 2013-03-07 (×2): 1 via ORAL
  Filled 2013-03-06 (×2): qty 1

## 2013-03-06 MED ORDER — HEPARIN BOLUS VIA INFUSION
3500.0000 [IU] | Freq: Once | INTRAVENOUS | Status: AC
  Administered 2013-03-06: 3500 [IU] via INTRAVENOUS
  Filled 2013-03-06: qty 3500

## 2013-03-06 MED ORDER — VITAMIN B-1 100 MG PO TABS
100.0000 mg | ORAL_TABLET | Freq: Every day | ORAL | Status: DC
  Administered 2013-03-06 – 2013-03-07 (×2): 100 mg via ORAL
  Filled 2013-03-06 (×2): qty 1

## 2013-03-06 NOTE — H&P (Signed)
[name] is an 42 y.o. male.   Chief Complaint: Chest pain HPI: 42 years old male with EKG changes of inferior wall ischemia has recurrent chest pain, left precordial and non-radiating. 3 SL NTG and aspirin helped with chest pain.  Past Medical History  Diagnosis Date  . Hypertension   . Asthma   . Pancreatitis   . Cocaine abuse   . Depression   . H/O suicide attempt       Past Surgical History  Procedure Laterality Date  . Cosmetic surgery    . Eye surgery      Family History  Problem Relation Age of Onset  . Hypertension Other   . Coronary artery disease Other    Social History:  reports that he has been smoking Cigarettes.  He has been smoking about 1.00 pack per day. He uses smokeless tobacco. He reports that he drinks alcohol. He reports that he uses illicit drugs (Cocaine and Marijuana).  Allergies:  Allergies  Allergen Reactions  . Trazodone And Nefazodone     Muscle spasms  . Shellfish-Derived Products Nausea And Vomiting     (Not in a hospital admission)  Results for orders placed during the hospital encounter of 03/06/13 (from the past 48 hour(s))  CBC     Status: Abnormal   Collection Time    03/06/13  2:48 PM      Result Value Range   WBC 8.6  4.0 - 10.5 K/uL   RBC 3.53 (*) 4.22 - 5.81 MIL/uL   Hemoglobin 11.3 (*) 13.0 - 17.0 g/dL   HCT 96.0 (*) 45.4 - 09.8 %   MCV 94.1  78.0 - 100.0 fL   MCH 32.0  26.0 - 34.0 pg   MCHC 34.0  30.0 - 36.0 g/dL   RDW 11.9  14.7 - 82.9 %   Platelets 209  150 - 400 K/uL  POCT I-STAT TROPONIN I     Status: None   Collection Time    03/06/13  3:00 PM      Result Value Range   Troponin i, poc 0.00  0.00 - 0.08 ng/mL   Comment 3            Comment: Due to the release kinetics of cTnI,     a negative result within the first hours     of the onset of symptoms does not rule out     myocardial infarction with certainty.     If myocardial infarction is still suspected,     repeat the test at appropriate  intervals.  BASIC METABOLIC PANEL     Status: None   Collection Time    03/06/13  3:04 PM      Result Value Range   Sodium 138  135 - 145 mEq/L   Potassium 3.8  3.5 - 5.1 mEq/L   Chloride 101  96 - 112 mEq/L   CO2 26  19 - 32 mEq/L   Glucose, Bld 90  70 - 99 mg/dL   BUN 9  6 - 23 mg/dL   Creatinine, Ser 5.62  0.50 - 1.35 mg/dL   Calcium 9.0  8.4 - 13.0 mg/dL   GFR calc non Af Amer >90  >90 mL/min   GFR calc Af Amer >90  >90 mL/min   Comment: (NOTE)     The eGFR has been calculated using the CKD EPI equation.     This calculation has not been validated in all clinical situations.  eGFR's persistently <90 mL/min signify possible Chronic Kidney     Disease.  HEPATIC FUNCTION PANEL     Status: Abnormal   Collection Time    03/06/13  3:04 PM      Result Value Range   Total Protein 7.6  6.0 - 8.3 g/dL   Albumin 3.8  3.5 - 5.2 g/dL   AST 40 (*) 0 - 37 U/L   ALT 36  0 - 53 U/L   Alkaline Phosphatase 75  39 - 117 U/L   Total Bilirubin 0.4  0.3 - 1.2 mg/dL   Bilirubin, Direct 0.1  0.0 - 0.3 mg/dL   Indirect Bilirubin 0.3  0.3 - 0.9 mg/dL  URINALYSIS, ROUTINE W REFLEX MICROSCOPIC     Status: None   Collection Time    03/06/13  3:30 PM      Result Value Range   Color, Urine YELLOW  YELLOW   APPearance CLEAR  CLEAR   Specific Gravity, Urine 1.017  1.005 - 1.030   pH 6.0  5.0 - 8.0   Glucose, UA NEGATIVE  NEGATIVE mg/dL   Hgb urine dipstick NEGATIVE  NEGATIVE   Bilirubin Urine NEGATIVE  NEGATIVE   Ketones, ur NEGATIVE  NEGATIVE mg/dL   Protein, ur NEGATIVE  NEGATIVE mg/dL   Urobilinogen, UA 0.2  0.0 - 1.0 mg/dL   Nitrite NEGATIVE  NEGATIVE   Leukocytes, UA NEGATIVE  NEGATIVE   Comment: MICROSCOPIC NOT DONE ON URINES WITH NEGATIVE PROTEIN, BLOOD, LEUKOCYTES, NITRITE, OR GLUCOSE <1000 mg/dL.  URINE RAPID DRUG SCREEN (HOSP PERFORMED)     Status: Abnormal   Collection Time    03/06/13  3:31 PM      Result Value Range   Opiates NONE DETECTED  NONE DETECTED   Cocaine NONE  DETECTED  NONE DETECTED   Benzodiazepines NONE DETECTED  NONE DETECTED   Amphetamines NONE DETECTED  NONE DETECTED   Tetrahydrocannabinol POSITIVE (*) NONE DETECTED   Barbiturates NONE DETECTED  NONE DETECTED   Comment:            DRUG SCREEN FOR MEDICAL PURPOSES     ONLY.  IF CONFIRMATION IS NEEDED     FOR ANY PURPOSE, NOTIFY LAB     WITHIN 5 DAYS.                LOWEST DETECTABLE LIMITS     FOR URINE DRUG SCREEN     Drug Class       Cutoff (ng/mL)     Amphetamine      1000     Barbiturate      200     Benzodiazepine   200     Tricyclics       300     Opiates          300     Cocaine          300     THC              50   Dg Chest 2 View  03/06/2013   CLINICAL DATA:  Chest pain. Hypertension. Headache.  EXAM: CHEST  2 VIEW  COMPARISON:  10/02/2012. 03/13/2011.  FINDINGS: Cardiopericardial silhouette and mediastinal contours are normal. No airspace disease or effusion. Mild basilar atelectasis. Bones appear within normal limits.  IMPRESSION: No active cardiopulmonary disease.   Electronically Signed   By: Andreas Newport M.D.   On: 03/06/2013 14:44    ROS + HTN, No hearing loss, + COPD,  No palpitation, + chest/rib pain, No nausea, vomiting or diarrhea. No hepatitis, No GI bleed, No stroke, no seizures, no psych admission.  Blood pressure 163/102, pulse 76, temperature 98.6 F (37 C), temperature source Oral, resp. rate 14, height 5\' 9"  (1.753 m), weight 64.411 kg (142 lb), SpO2 100.00%. Physical exam HEENT: [place]/AT, Brown eyes, Anicteric. No facial asymmetry. Tongue is midline.  Neck:-No JVD.  CHEST: Clear bilateral. Chest wall non-tender on palpation. HEART: S1 and S2 heard. No murmur.  ABDOMEN: Soft, No guarding. No rigidity.  CNS: Awake, alert, and oriented to time, place, and person. Moves upper and lower extremities, 5/5.  EXTREMITIES: No edema, no cyanosis. + clubbing.  Skin: Warm and dry.  Assessment/Plan Chest pain r/o MI Hypertension  History of alcohol and  substance use disorder.  C. Cath in AM. Patient understood risk, procedure and alternatives and wants to proceed with c. Cath.  Sukhmani Fetherolf S 03/06/2013, 4:45 PM

## 2013-03-06 NOTE — Discharge Planning (Signed)
[facility] Tivis Ringer, Community Liaison ext [phone]  Follow up appointment made with the [facility] for 03/20/13 at 2:00p with the onsite cardiologist. [facility] card  Appointment was also made for the same day (03/20/13) at 3:00pm. Patient has been to the clinic and was referred to see a cardiologist, but missed his past two appointments. Patient is aware of the upcoming appointments made, application and my contact information for any questions or concerns.

## 2013-03-06 NOTE — ED Notes (Signed)
PA student at bedside.

## 2013-03-06 NOTE — Progress Notes (Signed)
ANTICOAGULATION CONSULT NOTE - Initial Consult  Pharmacy Consult for heparin Indication: chest pain/ACS  Allergies  Allergen Reactions  . Trazodone And Nefazodone     Muscle spasms  . Shellfish-Derived Products Nausea And Vomiting    Patient Measurements: Height: 5\' 9"  (175.3 cm) Weight: 142 lb (64.411 kg) IBW/kg (Calculated) : 70.7 Heparin Dosing Weight: 64 kg  Vital Signs: Temp: 98.6 F (37 C) (10/29 1342) Temp src: Oral (10/29 1342) BP: 153/93 mmHg (10/29 1700) Pulse Rate: 83 (10/29 1700)  Labs:  Recent Labs  03/06/13 1448 03/06/13 1504  HGB 11.3*  --   HCT 33.2*  --   PLT 209  --   CREATININE  --  0.71    Estimated Creatinine Clearance: 108.5 ml/min (by C-G formula based on Cr of 0.71).   Medical History: Past Medical History  Diagnosis Date  . Hypertension   . Asthma   . Pancreatitis   . Cocaine abuse   . Depression   . H/O suicide attempt     Medications:  See med rec  Assessment: Patient is a 42 y.o M presented to the ED with c/o CP.  To start heparin therapy for ACS with plan for cardiac cath on 10/30.  Goal of Therapy:  Heparin level 0.3-0.7 units/ml Monitor platelets by anticoagulation protocol: Yes   Plan:  1) heparin 3500 units IV bolus x1, then drip at 750 units/hr 2) check 6 hour heparin level  Jahmad Petrich P 03/06/2013,5:29 PM

## 2013-03-06 NOTE — ED Notes (Addendum)
Pt arrives [facility] from [facility] for chest pain that started prior to arrival. Pt with headache.   0.4 mg SL Nitroglycerin X3 324 mg PO Aspirin

## 2013-03-06 NOTE — ED Provider Notes (Signed)
CSN: [id number]     Arrival date & time 03/06/13  1331 History   First MD Initiated Contact with Patient 03/06/13 1336     Chief Complaint  Patient presents with  . Chest Pain   (Consider location/radiation/quality/duration/timing/severity/associated sxs/prior Treatment) HPI  [name] is a 42 y.o. male  y/o AA male with history of [employer], cocaine induced ischemia, equivocal cardiac stress test (09/25/12 = EF 67%, no fixed or reversible deficit, normal left ventricular wall, normal ejection fraction), hypercholesterolemia, and alcoholism, who presents to the ED complaining of chest pain beginning at 12-12:30pm, while patient was sitting in a chair.  Describes pain as constant, sharp and stabbing, below L nipple, without radiation. Denies SOB.   Pain initially rated at 7/10.  He received 3 nitroglycerin SL tablets and 325mg  aspirin en route by EMS.  CP now rated at 4/10.  He is also complaining of a HA that began concurrently with CP.  Described as a dull ache in the frontal region, rated at 8/10.  Denies visual changes.  He denies history of cardiac chest pain.  Pt reports he has been stressed lately. Last alcohol use reported yesterday. Last cocaine use reported in 2012.  He is a smoker. Reports a family history of early MI in grandmother at age 73.  He takes clonidine twice daily but ran out yesterday. Has not followed with cardiology since his discharge in may.   Past Medical History  Diagnosis Date  . Hypertension   . Asthma   . Pancreatitis   . Cocaine abuse   . Depression   . H/O suicide attempt    Past Surgical History  Procedure Laterality Date  . Cosmetic surgery    . Eye surgery     Family History  Problem Relation Age of Onset  . Hypertension Other   . Coronary artery disease Other    History  Substance Use Topics  . Smoking status: Current Every Day Smoker -- 1.00 packs/day    Types: Cigarettes  . Smokeless tobacco: Current User  . Alcohol Use:  Yes     Comment: Last consumption- 2 days ago    Review of Systems  10 systems reviewed and found to be negative, except as noted in the HPI    Allergies  Trazodone and nefazodone and Shellfish-derived products  Home Medications   Current Outpatient Rx  Name  Route  Sig  Dispense  Refill  . albuterol (PROVENTIL HFA;VENTOLIN HFA) 108 (90 BASE) MCG/ACT inhaler   Inhalation   Inhale 2 puffs into the lungs every 6 (six) hours as needed for wheezing.   1 Inhaler   0   . cloNIDine (CATAPRES) 0.2 MG tablet   Oral   Take 1 tablet (0.2 mg total) by mouth 2 (two) times daily.   40 tablet   0   . omeprazole (PRILOSEC) 20 MG capsule   Oral   Take 1 capsule (20 mg total) by mouth daily.   20 capsule   0   . QUEtiapine Fumarate (SEROQUEL XR) 150 MG 24 hr tablet   Oral   Take 1 tablet (150 mg total) by mouth at bedtime.   30 tablet   0   . nitroGLYCERIN (NITROSTAT) 0.4 MG SL tablet   Sublingual   Place 0.4 mg under the tongue every 5 (five) minutes as needed for chest pain.          BP 166/95  Pulse 97  Temp(Src) 98.6 F (37 C) (Oral)  Resp 15  Ht 5\' 9"  (1.753 m)  Wt 142 lb (64.411 kg)  BMI 20.96 kg/m2  SpO2 100% Physical Exam  Nursing note and vitals reviewed. Constitutional: He is oriented to person, place, and time. He appears well-developed and well-nourished. No distress.  HENT:  Head: Normocephalic.  Mouth/Throat: Oropharynx is clear and moist.  Eyes: Conjunctivae and EOM are normal. Pupils are equal, round, and reactive to light.  Neck: Normal range of motion. No JVD present.  Cardiovascular: Normal rate, regular rhythm and normal heart sounds.   Pulmonary/Chest: Effort normal and breath sounds normal. No stridor. No respiratory distress. He has no wheezes. He has no rales. He exhibits no tenderness.  Abdominal: Soft. Bowel sounds are normal. He exhibits no distension and no mass. There is no tenderness. There is no rebound and no guarding.  Musculoskeletal:  Normal range of motion. He exhibits no edema and no tenderness.  No calf asymmetry, superficial collaterals, palpable cords, edema, Homans sign negative bilaterally.    Neurological: He is alert and oriented to person, place, and time.  Skin: Skin is warm and dry.  Psychiatric: He has a normal mood and affect.    ED Course  Procedures (including critical care time) Labs Review Labs Reviewed  CBC - Abnormal; Notable for the following:    RBC 3.53 (*)    Hemoglobin 11.3 (*)    HCT 33.2 (*)    All other components within normal limits  HEPATIC FUNCTION PANEL - Abnormal; Notable for the following:    AST 40 (*)    All other components within normal limits  BASIC METABOLIC PANEL  URINALYSIS, ROUTINE W REFLEX MICROSCOPIC  URINE RAPID DRUG SCREEN (HOSP PERFORMED)  POCT I-STAT TROPONIN I   Imaging Review Dg Chest 2 View  03/06/2013   CLINICAL DATA:  Chest pain. Hypertension. Headache.  EXAM: CHEST  2 VIEW  COMPARISON:  10/02/2012. 03/13/2011.  FINDINGS: Cardiopericardial silhouette and mediastinal contours are normal. No airspace disease or effusion. Mild basilar atelectasis. Bones appear within normal limits.  IMPRESSION: No active cardiopulmonary disease.   Electronically Signed   By: Andreas Newport M.D.   On: 03/06/2013 14:44    EKG Interpretation     Ventricular Rate:  95 PR Interval:  99 QRS Duration: 79 QT Interval:  350 QTC Calculation: 440 R Axis:   70 Text Interpretation:  Sinus rhythm Atrial premature complex Short PR interval Probable left atrial enlargement RSR' in V1 or V2, probably normal variant Abnrm T, consider ischemia, anterolateral lds Baseline wander in lead(s) II III aVF unchanged from prior            MDM   1. Chest pain at rest   2. Abnormal EKG   3. [name] Parkinson White pattern seen on electrocardiogram   4. Alcohol abuse      Filed Vitals:   03/06/13 1342 03/06/13 1400 03/06/13 1500 03/06/13 1535  BP: 166/95 157/93 157/95 152/96   Pulse: 97 107 80 92  Temp: 98.6 F (37 C)     TempSrc: Oral     Resp: 15 21 23 18   Height: 5\' 9"  (1.753 m)     Weight: 142 lb (64.411 kg)     SpO2: 100% 100% 100% 100%     [name] is a 42 y.o. male with history of cocaine-induced ischemia complaining of left-sided chest pain acute onset at 12:30 PM today. EKG shows new ST depression in the inferior leads. Pain relieved by nitroglycerin. Troponin is negative. No  anemia noted. Patient denies any recent cocaine abuse. Urine drug screen is pending. Patient was recently seen requesting alcohol detox. Last alcohol intake was yesterday. CIWA protocol initiated. Nitro paste also initiated. Cardiology consult from Dr. Algie Coffer appreciated: He will come to evaluate and admit the patient.  Medications  LORazepam (ATIVAN) tablet 1 mg (not administered)    Or  LORazepam (ATIVAN) injection 1 mg (not administered)  thiamine (VITAMIN B-1) tablet 100 mg (not administered)    Or  thiamine (B-1) injection 100 mg (not administered)  folic acid (FOLVITE) tablet 1 mg (not administered)  multivitamin with minerals tablet 1 tablet (not administered)  LORazepam (ATIVAN) tablet 0-4 mg (not administered)    Followed by  LORazepam (ATIVAN) tablet 0-4 mg (not administered)  acetaminophen (TYLENOL) tablet 650 mg (650 mg Oral Given 03/06/13 1453)  nitroGLYCERIN (NITROGLYN) 2 % ointment 1 inch (1 inch Topical Given 03/06/13 1538)   Note: Portions of this report may have been transcribed using voice recognition software. Every effort was made to ensure accuracy; however, inadvertent computerized transcription errors may be present      [name], PA-C 03/06/13 1550

## 2013-03-06 NOTE — ED Notes (Signed)
Patient transported to X-ray 

## 2013-03-07 ENCOUNTER — Encounter (HOSPITAL_COMMUNITY)
Admission: EM | Disposition: A | Discharge status: Home / Self Care | Payer: Self-pay | Source: Home / Self Care | Attending: Emergency Medicine

## 2013-03-07 HISTORY — PX: LEFT HEART CATHETERIZATION WITH CORONARY ANGIOGRAM: SHX5451

## 2013-03-07 LAB — BASIC METABOLIC PANEL
BUN: 8 mg/dL (ref 6–23)
CO2: 24 mEq/L (ref 19–32)
Calcium: 8.6 mg/dL (ref 8.4–10.5)
Chloride: 104 mEq/L (ref 96–112)
Creatinine, Ser: 0.58 mg/dL (ref 0.50–1.35)
GFR calc Af Amer: >90 mL/min (ref >90)
GFR calc non Af Amer: >90 mL/min (ref >90)
Glucose, Bld: 92 mg/dL (ref 70–99)
Potassium: 3.5 mEq/L (ref 3.5–5.1)
Sodium: 138 mEq/L (ref 135–145)

## 2013-03-07 LAB — TROPONIN I
Troponin I: <0.3 ng/mL (ref <0.30)
Troponin I: <0.3 ng/mL (ref <0.30)

## 2013-03-07 LAB — POCT ACTIVATED CLOTTING TIME: Activated Clotting Time: 130 seconds

## 2013-03-07 LAB — LIPID PANEL
Cholesterol: 159 mg/dL (ref 0–200)
HDL: 77 mg/dL (ref >39)
Triglycerides: 86 mg/dL (ref <150)
VLDL: 17 mg/dL (ref 0–40)

## 2013-03-07 LAB — CBC
HCT: 32.2 % — ABNORMAL LOW (ref 39.0–52.0)
Hemoglobin: 11.2 g/dL — ABNORMAL LOW (ref 13.0–17.0)
MCHC: 34.8 g/dL (ref 30.0–36.0)
MCV: 93.6 fL (ref 78.0–100.0)
RBC: 3.44 MIL/uL — ABNORMAL LOW (ref 4.22–5.81)
RDW: 14.9 % (ref 11.5–15.5)

## 2013-03-07 LAB — PROTIME-INR: Prothrombin Time: 12.6 seconds (ref 11.6–15.2)

## 2013-03-07 SURGERY — LEFT HEART CATHETERIZATION WITH CORONARY ANGIOGRAM
Anesthesia: Moderate Sedation | Laterality: Right

## 2013-03-07 MED ORDER — HEPARIN (PORCINE) IN NACL 2-0.9 UNIT/ML-% IJ SOLN
INTRAMUSCULAR | Status: AC
  Filled 2013-03-07: qty 1000

## 2013-03-07 MED ORDER — ACETAMINOPHEN 325 MG PO TABS: 650.0000 mg | ORAL_TABLET | Freq: Four times a day (QID) | ORAL | Status: DC | PRN

## 2013-03-07 MED ORDER — SODIUM CHLORIDE 0.9 % IV SOLN: INTRAVENOUS | Status: AC

## 2013-03-07 MED ORDER — NITROGLYCERIN 0.2 MG/ML ON CALL CATH LAB
INTRAVENOUS | Status: AC
  Filled 2013-03-07: qty 1

## 2013-03-07 MED ORDER — MIDAZOLAM HCL 2 MG/2ML IJ SOLN
INTRAMUSCULAR | Status: AC
  Filled 2013-03-07: qty 2

## 2013-03-07 MED ORDER — PANTOPRAZOLE SODIUM 40 MG PO TBEC: 40.0000 mg | DELAYED_RELEASE_TABLET | Freq: Every day | ORAL | Status: DC

## 2013-03-07 MED ORDER — LIDOCAINE HCL (PF) 1 % IJ SOLN
INTRAMUSCULAR | Status: AC
  Filled 2013-03-07: qty 30

## 2013-03-07 NOTE — Progress Notes (Signed)
ANTICOAGULATION CONSULT NOTE - Follow Up Consult  Pharmacy Consult for Heparin  Indication: chest pain/ACS  Allergies  Allergen Reactions  . Trazodone And Nefazodone     Muscle spasms  . Shellfish-Derived Products Nausea And Vomiting    Patient Measurements: Height: 5\' 9"  (175.3 cm) Weight: 142 lb (64.411 kg) IBW/kg (Calculated) : 70.7  Vital Signs: Temp: 98.1 F (36.7 C) (10/30 0003) Temp src: Oral (10/29 1342) BP: 140/91 mmHg (10/30 0003) Pulse Rate: 98 (10/30 0003)  Labs:  Recent Labs  03/06/13 1448 03/06/13 1504 03/06/13 1910 03/06/13 2345  HGB 11.3*  --   --   --   HCT 33.2*  --   --   --   PLT 209  --   --   --   HEPARINUNFRC  --   --   --  0.20*  CREATININE  --  0.71  --   --   TROPONINI  --   --  <0.30  --     Estimated Creatinine Clearance: 108.5 ml/min (by C-G formula based on Cr of 0.71).   Medications:  Heparin 750 units/hr  Assessment: 42 y/o M with CP on heparin, plan for cath this AM. HL is 0.20.   Goal of Therapy:  Heparin level 0.3-0.7 units/ml Monitor platelets by anticoagulation protocol: Yes   Plan:  -Increase heparin drip to 900 units/hr -HL at 0730 -Daily CBC/HL, though likely for cath this AM -Monitor for bleeding  Thank you for allowing me to take part in this patient's care,  Abran Duke, PharmD Clinical Pharmacist Phone: [phone] Pager: [phone] 03/07/2013 12:51 AM

## 2013-03-07 NOTE — CV Procedure (Signed)
PROCEDURE:  Left heart catheterization with selective coronary angiography, left ventriculogram.  CLINICAL HISTORY:  This is a 42 year old male presented with recurrent chest pain and EKG changes of inferior ischemia.  The risks, benefits, and details of the procedure were explained to the patient.  The patient verbalized understanding and wanted to proceed.  Informed written consent was obtained.  PROCEDURE TECHNIQUE:  The patient was approached from the right femoral artery using a 5 French short sheath.  Left coronary angiography was done using a Judkins L4 guide catheter.  Right coronary angiography was done using a Judkins R4 guide catheter.  Left ventriculography was done using a pigtail catheter.    CONTRAST:  Total of 48 cc.  COMPLICATIONS:  None.  At the end of the procedure a manual device was used for hemostasis.    HEMODYNAMICS:  Aortic pressure was 109/75; LV pressure was 111/5; LVEDP 11.  There was no gradient between the left ventricle and aorta.    ANGIOGRAM/CORONARY ARTERIOGRAM:   The left main coronary artery is normal.  The left anterior descending artery is normal.  The left circumflex artery is normal.  The right coronary artery is normal.  LEFT VENTRICULOGRAM:  Left ventricular angiogram was done in the 30 RAO projection and revealed normal left ventricular wall motion and systolic function with an estimated ejection fraction of 70%.  LVEDP was 11 mmHg.  IMPRESSION OF HEART CATHETERIZATION:   1. Normal left main coronary artery. 2. Normal left anterior descending artery and its branches. 3. Normal left circumflex artery and its branches. 4. Normal right coronary artery. 5. Normal left ventricular systolic function.  LVEDP 11 mmHg.  Ejection fraction 70%.  RECOMMENDATION:   Life style modification and non-cardiac chest pain evaluation as needed.

## 2013-03-07 NOTE — Interval H&P Note (Deleted)
Cath Lab Visit (complete for each Cath Lab visit)  Clinical Evaluation Leading to the Procedure:   ACS: yes  Non-ACS:    Anginal Classification: CCS II  Anti-ischemic medical therapy: Maximal Therapy (2 or more classes of medications)  Non-Invasive Test Results: Low-risk stress test findings: cardiac mortality <1%/year  Prior CABG: No previous CABG      History and Physical Interval Note:  03/07/2013 8:18 AM  [name]  has presented today for surgery, with the diagnosis of CP  The various methods of treatment have been discussed with the patient and family. After consideration of risks, benefits and other options for treatment, the patient has consented to  Procedure(s): LEFT HEART CATHETERIZATION WITH CORONARY ANGIOGRAM (Right) as a surgical intervention .  The patient's history has been reviewed, patient examined, no change in status, stable for surgery.  I have reviewed the patient's chart and labs.  Questions were answered to the patient's satisfaction.     Tatiana Courter S

## 2013-03-07 NOTE — Discharge Summary (Signed)
Physician Discharge Summary  Patient ID: [name] MRN: [id number] DOB/AGE: 1970/10/25 42 y.o.  Admit date: 03/06/2013 Discharge date: 03/07/2013  Admission Diagnoses: Chest pain r/o MI  Hypertension  History of alcohol and substance use disorder.  Discharge Diagnoses:  Principle Problem:  * Chest pain, non-cardiac*  Hypertension  History of alcohol and substance use disorder.  Discharged Condition: fair  Hospital Course: 42 years old male with recurrent chest pain, responding to SL NTG and EKG changes of ischemia was treated with IV morphine. He underwent cardiac cath that showed normal coronaries. He was advised to keep f/u with his primary care doctor, avoid lifting, pulling or pushing for 2 days and use over the counter Zantac and Maalox as needed.  Consults: None  Significant Diagnostic Studies: labs: Near normal bmet and CBC with borderline Hgb of 11.3. Normal cardiac enzymes x 3.   Normal coronaries on cardiac cath.  Treatments: analgesia: Morphine and cardiac meds: metoprolol  Discharge Exam: Blood pressure 138/84, pulse 95, temperature 97.6 F (36.4 C), temperature source Oral, resp. rate 16, height 5\' 9"  (1.753 m), weight 64.5 kg (142 lb 3.2 oz), SpO2 100.00%. Physical exam: HEENT: [place]/AT, Brown eyes, Anicteric. No facial asymmetry. Tongue is midline.  Neck:-No JVD.  CHEST: Clear bilateral. Chest wall non-tender on palpation.  HEART: S1 and S2 heard. No murmur.  ABDOMEN: Soft, No guarding. No rigidity.  CNS: Awake, alert, and oriented to time, place, and person. Moves upper and lower extremities, 5/5.  EXTREMITIES: No edema, no cyanosis. + clubbing.  Skin: Warm and dry.  Disposition: 01-Home or Self Care   Future Appointments Provider Department Dept Phone   03/20/2013 2:00 PM Gaylord Shih, MD [facility] [phone]   03/20/2013 3:00 PM [employer] Financial Counselor [facility] [phone]        Medication List    STOP taking these medications       nitroGLYCERIN 0.4 MG SL tablet  Commonly known as:  NITROSTAT      TAKE these medications       albuterol 108 (90 BASE) MCG/ACT inhaler  Commonly known as:  PROVENTIL HFA;VENTOLIN HFA  Inhale 2 puffs into the lungs every 6 (six) hours as needed for wheezing.     cloNIDine 0.2 MG tablet  Commonly known as:  CATAPRES  Take 1 tablet (0.2 mg total) by mouth 2 (two) times daily.     omeprazole 20 MG capsule  Commonly known as:  PRILOSEC  Take 1 capsule (20 mg total) by mouth daily.     QUEtiapine Fumarate 150 MG 24 hr tablet  Commonly known as:  SEROQUEL XR  Take 1 tablet (150 mg total) by mouth at bedtime.           Follow-up Information   Follow up with Jeanann Lewandowsky, MD. (keep appointment)    Specialty:  Internal Medicine   Contact information:   [address] [place] [address] [phone]       Follow up with Ricki Rodriguez, MD. (As needed)    Specialty:  Cardiology   Contact information:   [address] [place] [address] [phone]       Signed: Ricki Rodriguez 03/07/2013, 6:05 PM

## 2013-03-07 NOTE — Interval H&P Note (Signed)
Cath Lab Visit (complete for each Cath Lab visit)  Clinical Evaluation Leading to the Procedure:   ACS: no  Non-ACS:    Anginal Classification: CCS II  Anti-ischemic medical therapy: Maximal Therapy (2 or more classes of medications)  Non-Invasive Test Results: Low-risk stress test findings: cardiac mortality <1%/year  Prior CABG: No previous CABG      History and Physical Interval Note:  03/07/2013 8:23 AM  [name]  has presented today for surgery, with the diagnosis of CP  The various methods of treatment have been discussed with the patient and family. After consideration of risks, benefits and other options for treatment, the patient has consented to  Procedure(s): LEFT HEART CATHETERIZATION WITH CORONARY ANGIOGRAM (Right) as a surgical intervention .  The patient's history has been reviewed, patient examined, no change in status, stable for surgery.  I have reviewed the patient's chart and labs.  Questions were answered to the patient's satisfaction.     Lauren Aguayo S

## 2013-03-07 NOTE — ED Provider Notes (Signed)
Medical screening examination/treatment/procedure(s) were performed by non-physician practitioner and as supervising physician I was immediately available for consultation/collaboration.    Vida Roller, MD 03/07/13 [phone]

## 2013-03-07 NOTE — Progress Notes (Signed)
Patient began complaining of stomach and chest pain. Right groin level 0. I called Dr. Algie Coffer and asked him if he wanted me to treat it as a standard chest pain pt with morphine etc. He stated no, and ordered protonix and tylenol. Will continue to monitor and follow up. Plan for discharge this afternoon per MD

## 2013-03-07 NOTE — Progress Notes (Signed)
Nutrition Brief Note  Patient identified on the Malnutrition Screening Tool (MST) Report for 2-3 lb weight loss and poor intake PTA. Patient reports some weight loss 6 months ago, but weight has started trending back up. No significant weight loss identified.  Wt Readings from Last 15 Encounters:  03/07/13 142 lb 3.2 oz (64.5 kg)  03/07/13 142 lb 3.2 oz (64.5 kg)  03/02/13 140 lb 1.6 oz (63.549 kg)  11/08/12 136 lb 9.6 oz (61.961 kg)  10/03/12 131 lb 11.2 oz (59.739 kg)  09/28/11 140 lb (63.504 kg)  07/01/11 148 lb (67.132 kg)  04/30/10 142 lb 11.2 oz (64.728 kg)  05/28/09 140 lb 12.8 oz (63.866 kg)  03/06/08 139 lb (63.05 kg)  07/27/07 143 lb 8 oz (65.091 kg)  04/24/07 148 lb 8 oz (67.359 kg)  03/23/07 150 lb (68.04 kg)    Body mass index is 20.99 kg/(m^2). Patient meets criteria for normal weight based on current BMI.   Current diet order is heart healthy, patient is consuming approximately 100% of meals at this time. Labs and medications reviewed.   No nutrition interventions warranted at this time. If nutrition issues arise, please consult RD.   Joaquin Courts, RD, LDN, CNSC Pager [phone] After Hours Pager [phone]

## 2013-03-13 ENCOUNTER — Ambulatory Visit: Payer: Self-pay | Admitting: Cardiology

## 2013-03-16 ENCOUNTER — Emergency Department (HOSPITAL_BASED_OUTPATIENT_CLINIC_OR_DEPARTMENT_OTHER)
Admission: EM | Admit: 2013-03-16 | Discharge: 2013-03-16 | Disposition: A | Discharge status: Home / Self Care | Payer: Self-pay | Attending: Emergency Medicine | Admitting: Emergency Medicine

## 2013-03-16 ENCOUNTER — Encounter (HOSPITAL_BASED_OUTPATIENT_CLINIC_OR_DEPARTMENT_OTHER): Payer: Self-pay | Admitting: Emergency Medicine

## 2013-03-16 ENCOUNTER — Emergency Department (HOSPITAL_BASED_OUTPATIENT_CLINIC_OR_DEPARTMENT_OTHER): Payer: Self-pay

## 2013-03-16 DIAGNOSIS — F17200 Nicotine dependence, unspecified, uncomplicated: Secondary | ICD-10-CM | POA: Insufficient documentation

## 2013-03-16 DIAGNOSIS — Z8781 Personal history of (healed) traumatic fracture: Secondary | ICD-10-CM | POA: Insufficient documentation

## 2013-03-16 DIAGNOSIS — Z8739 Personal history of other diseases of the musculoskeletal system and connective tissue: Secondary | ICD-10-CM | POA: Insufficient documentation

## 2013-03-16 DIAGNOSIS — R011 Cardiac murmur, unspecified: Secondary | ICD-10-CM | POA: Insufficient documentation

## 2013-03-16 DIAGNOSIS — R209 Unspecified disturbances of skin sensation: Secondary | ICD-10-CM | POA: Insufficient documentation

## 2013-03-16 DIAGNOSIS — M542 Cervicalgia: Secondary | ICD-10-CM | POA: Insufficient documentation

## 2013-03-16 DIAGNOSIS — R0789 Other chest pain: Secondary | ICD-10-CM | POA: Insufficient documentation

## 2013-03-16 DIAGNOSIS — Z79899 Other long term (current) drug therapy: Secondary | ICD-10-CM | POA: Insufficient documentation

## 2013-03-16 DIAGNOSIS — Z862 Personal history of diseases of the blood and blood-forming organs and certain disorders involving the immune mechanism: Secondary | ICD-10-CM | POA: Insufficient documentation

## 2013-03-16 DIAGNOSIS — M79609 Pain in unspecified limb: Secondary | ICD-10-CM | POA: Insufficient documentation

## 2013-03-16 DIAGNOSIS — R42 Dizziness and giddiness: Secondary | ICD-10-CM | POA: Insufficient documentation

## 2013-03-16 DIAGNOSIS — F329 Major depressive disorder, single episode, unspecified: Secondary | ICD-10-CM | POA: Insufficient documentation

## 2013-03-16 DIAGNOSIS — F3289 Other specified depressive episodes: Secondary | ICD-10-CM | POA: Insufficient documentation

## 2013-03-16 DIAGNOSIS — F411 Generalized anxiety disorder: Secondary | ICD-10-CM | POA: Insufficient documentation

## 2013-03-16 DIAGNOSIS — K219 Gastro-esophageal reflux disease without esophagitis: Secondary | ICD-10-CM | POA: Insufficient documentation

## 2013-03-16 DIAGNOSIS — I10 Essential (primary) hypertension: Secondary | ICD-10-CM | POA: Insufficient documentation

## 2013-03-16 DIAGNOSIS — Z9889 Other specified postprocedural states: Secondary | ICD-10-CM | POA: Insufficient documentation

## 2013-03-16 DIAGNOSIS — J45909 Unspecified asthma, uncomplicated: Secondary | ICD-10-CM | POA: Insufficient documentation

## 2013-03-16 DIAGNOSIS — R51 Headache: Secondary | ICD-10-CM | POA: Insufficient documentation

## 2013-03-16 MED ORDER — PROMETHAZINE HCL 25 MG/ML IJ SOLN
25.0000 mg | Freq: Once | INTRAMUSCULAR | Status: AC
  Administered 2013-03-16: 25 mg via INTRAMUSCULAR
  Filled 2013-03-16: qty 1

## 2013-03-16 MED ORDER — TRAMADOL HCL 50 MG PO TABS: 50.0000 mg | ORAL_TABLET | Freq: Four times a day (QID) | ORAL | Status: DC | PRN

## 2013-03-16 MED ORDER — KETOROLAC TROMETHAMINE 60 MG/2ML IM SOLN
60.0000 mg | Freq: Once | INTRAMUSCULAR | Status: AC
  Administered 2013-03-16: 60 mg via INTRAMUSCULAR
  Filled 2013-03-16: qty 2

## 2013-03-16 MED ORDER — HYDROCODONE-ACETAMINOPHEN 5-325 MG PO TABS: 2.0000 | ORAL_TABLET | ORAL | Status: DC | PRN

## 2013-03-16 NOTE — ED Notes (Signed)
Patient was originally given a prescription for vicoden, but the patient is currently residing at [facility] for rehab. Called the staff to make sure he was unable to have the prescription, they said no. Took the prescription and told the patient that he was not allowed to have it while at [facility]. At that time he began saying that he was being discharged tomorrow and wanted to know why he couldn't have it. Continued to explain the reason despite when he stated his D/C would be from [facility]. Patient was then written a prescription for tramadol, to which the patient stated he wasn't allowed to have that either. Patient continued to stand in the hallway and argue about not being able to have his prescription. When his ride to the facility arrived, the patient sent her back to talk to the nurses and she received the same explanation.

## 2013-03-16 NOTE — ED Notes (Signed)
Recent admission for HTN problems.  States BP has elevated again, onset last night.  Reports sharp temporal headache, head tingling today.  Has been compliant with medications.

## 2013-03-16 NOTE — ED Provider Notes (Signed)
CSN: [id number]     Arrival date & time 03/16/13  1413 History  This chart was scribed for Geoffery Lyons, MD by Dorothey Baseman, ED Scribe. This patient was seen in room MH12/MH12 and the patient's care was started at 3:37 PM.    Chief Complaint  Patient presents with  . Hypertension  . Headache   The history is provided by the patient. No language interpreter was used.   HPI Comments: [name] is a 42 y.o. Male with a history of HTN who presents to the Emergency Department complaining of hypertension onset last night (147/90 upon arrival to the ED). Patient reports that he has recently been experiencing some stress, but has been compliant with his prescribed course of medications to manage his HTN. Patient reports some associated, mild chest and left arm pain, neck stiffness. He states that he was hospitalized at [facility] on 03/07/2013 for HTN and chest pain and received a heart catheterization that he states has been healing well. Patient reports an associated, sharp, stabbing headache to the right temporal region with dizziness and paresthesias to the area onset around 3.5 hours ago. He reports a history of headaches, but not migraines, and states that his current symptoms are more severe than prior episodes. Patient reports a history of facial fracture and eye surgery. He denies fever, cough, or neck pain. Patient reports a history of pancreatitis secondary to alcohol abuse, but reports that he had not had a drink in 10 days. Patient also reports a history of asthma and heart murmur. He denies anticoagulant use.   Past Medical History  Diagnosis Date  . Hypertension   . Asthma   . Pancreatitis   . Cocaine abuse   . Depression   . H/O suicide attempt   . Heart murmur     "when he was little" (03/06/2013)  . Shortness of breath     "can happen at anytime" (03/06/2013)  . Anemia   . H/O hiatal hernia   . GERD (gastroesophageal reflux disease)   . Seizures     "weekly" (03/06/2013)   . Arthritis     "knees" (03/06/2013)  . Anxiety   . WPW (Wolff-Parkinson-White syndrome)     [name] 03/06/2013   Past Surgical History  Procedure Laterality Date  . Facial fracture surgery Left 1990's    "result of trauma" (03/06/2013)  . Eye surgery Left 1990's    "result of trauma" (03/06/2013)   Family History  Problem Relation Age of Onset  . Hypertension Other   . Coronary artery disease Other    History  Substance Use Topics  . Smoking status: Current Every Day Smoker -- 1.00 packs/day for 30 years    Types: Cigarettes  . Smokeless tobacco: Current User  . Alcohol Use: 43.2 oz/week    70 Cans of beer, 2 Shots of liquor per week     Comment: 03/06/2013 "3, 40oz/day; plus couple shots liquor once/wk"    Review of Systems  A complete 10 system review of systems was obtained and all systems are negative except as noted in the HPI and PMH.   Allergies  Trazodone and nefazodone and Shellfish-derived products  Home Medications   Current Outpatient Rx  Name  Route  Sig  Dispense  Refill  . albuterol (PROVENTIL HFA;VENTOLIN HFA) 108 (90 BASE) MCG/ACT inhaler   Inhalation   Inhale 2 puffs into the lungs every 6 (six) hours as needed for wheezing.   1 Inhaler   0   .  cloNIDine (CATAPRES) 0.2 MG tablet   Oral   Take 1 tablet (0.2 mg total) by mouth 2 (two) times daily.   40 tablet   0   . omeprazole (PRILOSEC) 20 MG capsule   Oral   Take 1 capsule (20 mg total) by mouth daily.   20 capsule   0   . QUEtiapine Fumarate (SEROQUEL XR) 150 MG 24 hr tablet   Oral   Take 1 tablet (150 mg total) by mouth at bedtime.   30 tablet   0    Triage Vitals: Pulse 105  Temp(Src) 98.2 F (36.8 C) (Oral)  Ht 5\' 9"  (1.753 m)  Wt 143 lb (64.864 kg)  BMI 21.11 kg/m2  SpO2 100%  Physical Exam  Nursing note and vitals reviewed. Constitutional: He is oriented to person, place, and time. He appears well-developed and well-nourished. No distress.  HENT:  Head:  Normocephalic and atraumatic.  Mouth/Throat: Oropharynx is clear and moist.  Eyes: Conjunctivae and EOM are normal. Pupils are equal, round, and reactive to light.  Neck: Normal range of motion. Neck supple.  Cardiovascular: Normal rate, regular rhythm and normal heart sounds.  Exam reveals no gallop and no friction rub.   No murmur heard. Pulmonary/Chest: Effort normal and breath sounds normal. No respiratory distress. He has no wheezes. He has no rales.  Abdominal: He exhibits no distension.  Musculoskeletal: Normal range of motion.  Lymphadenopathy:    He has no cervical adenopathy.  Neurological: He is alert and oriented to person, place, and time. No cranial nerve deficit. He exhibits normal muscle tone. Coordination normal.  Skin: Skin is warm and dry.  Psychiatric: He has a normal mood and affect. His behavior is normal.    ED Course  Procedures (including critical care time)  DIAGNOSTIC STUDIES: Oxygen Saturation is 100% on room air, normal by my interpretation.    COORDINATION OF CARE: 3:45 PM- Will order a CT of the head. Will order Toradol and Phenergan to manage symptoms. Discussed treatment plan with patient at bedside and patient verbalized agreement.     Labs Review Labs Reviewed - No data to display  Imaging Review Ct Head Wo Contrast   03/16/2013   CLINICAL DATA:  Right temporal headache, hypertension  EXAM: CT HEAD WITHOUT CONTRAST  TECHNIQUE: Contiguous axial images were obtained from the base of the skull through the vertex without intravenous contrast.  COMPARISON:  10/02/2012  FINDINGS: No evidence of parenchymal hemorrhage or extra-axial fluid collection. No mass lesion, mass effect, or midline shift.  No CT evidence of acute infarction.  Cerebral volume is within normal limits.  No ventriculomegaly.  The visualized paranasal sinuses are essentially clear. The mastoid air cells are unopacified.  No evidence of calvarial fracture.  IMPRESSION: No evidence of  acute intracranial abnormality.   Electronically Signed   By: Charline Bills M.D.   On: 03/16/2013 16:08    EKG Interpretation   None       MDM  No diagnosis found. Patient presents here with complaints of headache since earlier this morning. He states that it started suddenly and is sharp in nature. He denies any visual complaints or any numbness or weakness. He underwent heart catheterization last week which was unremarkable, but did receive multiple doses of blood thinning medication. For this reason a CT of the head was obtained. This was performed and was unremarkable. He was then given medications for his headache. He is neurologically intact and I see no indication for an  LP or further workup at this time. He does not appear meningitic and has no fever. I will prescribe a small quantity of pain medication that he can take. If he is not improving in the next several days he should follow up with his primary Dr. to discuss.  He is concerned about his blood pressure being high it has looked acceptable while here and nothing that is in need of emergent treatment.  I personally performed the services described in this documentation, which was scribed in my presence. The recorded information has been reviewed and is accurate.       Geoffery Lyons, MD 03/16/13 [phone]

## 2013-03-16 NOTE — ED Notes (Signed)
Patient states that he has a sharp pain on the right side of his head. He says that along the back of his head is tingling and he feels "like his skin is crawling". He also states that his pancreas hurt, but that he doesn't understand why when he hasn't drank alcohol in 10 days. Patient from [facility]

## 2013-03-16 NOTE — ED Notes (Signed)
Reports at [facility] for ETOH rehab currently.  C/o abdominal pain 'since I've not had a drink x 9 days.'

## 2013-03-16 NOTE — ED Notes (Signed)
After the patient left and went back to [facility] he began calling back to the ER to speak with Dr. Judd Lien or myself. He was told by the secretary that we were busy multiple times. The flow manager called and told me that he was on the phone with her and wanted to speak to me, at which time I told her that i would not speak with the patient. He then called back to the ER again and asked to speak to me. At that time he was told that i was no longer on the unit. Chanin called [facility] and requested that the patient stop calling the ER.

## 2013-03-20 ENCOUNTER — Ambulatory Visit: Payer: Self-pay

## 2013-03-20 ENCOUNTER — Encounter: Payer: Self-pay | Admitting: Cardiology

## 2013-03-20 ENCOUNTER — Ambulatory Visit: Payer: Self-pay | Attending: Cardiology | Admitting: Cardiology

## 2013-03-20 VITALS — BP 143/93 | HR 106 | Temp 98.0°F | Resp 17 | Ht 69.0 in | Wt 151.2 lb

## 2013-03-20 DIAGNOSIS — F1910 Other psychoactive substance abuse, uncomplicated: Secondary | ICD-10-CM

## 2013-03-20 DIAGNOSIS — R079 Chest pain, unspecified: Secondary | ICD-10-CM

## 2013-03-20 DIAGNOSIS — R9431 Abnormal electrocardiogram [ECG] [EKG]: Secondary | ICD-10-CM

## 2013-03-20 DIAGNOSIS — F17200 Nicotine dependence, unspecified, uncomplicated: Secondary | ICD-10-CM

## 2013-03-20 DIAGNOSIS — I10 Essential (primary) hypertension: Secondary | ICD-10-CM

## 2013-03-20 DIAGNOSIS — I456 Pre-excitation syndrome: Secondary | ICD-10-CM

## 2013-03-20 DIAGNOSIS — F1010 Alcohol abuse, uncomplicated: Secondary | ICD-10-CM

## 2013-03-20 MED ORDER — CLONIDINE HCL 0.2 MG PO TABS: 0.2000 mg | ORAL_TABLET | Freq: Two times a day (BID) | ORAL | Status: DC

## 2013-03-20 NOTE — Assessment & Plan Note (Signed)
Encouraged to decrease or stop.

## 2013-03-20 NOTE — Assessment & Plan Note (Signed)
Under good control. Clonidine represcribed.

## 2013-03-20 NOTE — Assessment & Plan Note (Signed)
Will remain in rehabilitation center for at least 30 days.
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
## 2013-03-20 NOTE — Progress Notes (Signed)
Patient states here to dr  For wolf parkinson white syndrome Has history asthma HTN depression Had cardiac cath done almost two weeks ago to rule out Any blockage

## 2013-03-20 NOTE — Assessment & Plan Note (Signed)
Noncardiac. I suspect is related to some muscles by some or strain from coughing from chronic bronchitis. Patient reassured that he does not need to go back to the emergency room for chest pain. No cardiology followup necessary. Set up for primary care.

## 2013-04-11 ENCOUNTER — Ambulatory Visit: Payer: Self-pay

## 2013-04-11 ENCOUNTER — Ambulatory Visit: Payer: Self-pay | Admitting: Internal Medicine

## 2013-04-22 ENCOUNTER — Encounter (HOSPITAL_COMMUNITY): Payer: Self-pay | Admitting: Emergency Medicine

## 2013-04-22 ENCOUNTER — Emergency Department (HOSPITAL_COMMUNITY)
Admission: EM | Admit: 2013-04-22 | Discharge: 2013-04-23 | Disposition: A | Discharge status: Home / Self Care | Payer: Self-pay | Attending: Emergency Medicine | Admitting: Emergency Medicine

## 2013-04-22 DIAGNOSIS — D649 Anemia, unspecified: Secondary | ICD-10-CM | POA: Insufficient documentation

## 2013-04-22 DIAGNOSIS — R197 Diarrhea, unspecified: Secondary | ICD-10-CM | POA: Insufficient documentation

## 2013-04-22 DIAGNOSIS — R109 Unspecified abdominal pain: Secondary | ICD-10-CM | POA: Insufficient documentation

## 2013-04-22 DIAGNOSIS — F1410 Cocaine abuse, uncomplicated: Secondary | ICD-10-CM | POA: Insufficient documentation

## 2013-04-22 DIAGNOSIS — Z8709 Personal history of other diseases of the respiratory system: Secondary | ICD-10-CM | POA: Insufficient documentation

## 2013-04-22 DIAGNOSIS — Z8659 Personal history of other mental and behavioral disorders: Secondary | ICD-10-CM | POA: Insufficient documentation

## 2013-04-22 DIAGNOSIS — F329 Major depressive disorder, single episode, unspecified: Secondary | ICD-10-CM | POA: Insufficient documentation

## 2013-04-22 DIAGNOSIS — M129 Arthropathy, unspecified: Secondary | ICD-10-CM | POA: Insufficient documentation

## 2013-04-22 DIAGNOSIS — Z79899 Other long term (current) drug therapy: Secondary | ICD-10-CM | POA: Insufficient documentation

## 2013-04-22 DIAGNOSIS — F3289 Other specified depressive episodes: Secondary | ICD-10-CM | POA: Insufficient documentation

## 2013-04-22 DIAGNOSIS — R112 Nausea with vomiting, unspecified: Secondary | ICD-10-CM | POA: Insufficient documentation

## 2013-04-22 DIAGNOSIS — Z888 Allergy status to other drugs, medicaments and biological substances status: Secondary | ICD-10-CM | POA: Insufficient documentation

## 2013-04-22 DIAGNOSIS — I10 Essential (primary) hypertension: Secondary | ICD-10-CM | POA: Insufficient documentation

## 2013-04-22 DIAGNOSIS — F411 Generalized anxiety disorder: Secondary | ICD-10-CM | POA: Insufficient documentation

## 2013-04-22 DIAGNOSIS — Z8719 Personal history of other diseases of the digestive system: Secondary | ICD-10-CM | POA: Insufficient documentation

## 2013-04-22 DIAGNOSIS — K219 Gastro-esophageal reflux disease without esophagitis: Secondary | ICD-10-CM | POA: Insufficient documentation

## 2013-04-22 DIAGNOSIS — R569 Unspecified convulsions: Secondary | ICD-10-CM | POA: Insufficient documentation

## 2013-04-22 DIAGNOSIS — J45909 Unspecified asthma, uncomplicated: Secondary | ICD-10-CM | POA: Insufficient documentation

## 2013-04-22 DIAGNOSIS — F17200 Nicotine dependence, unspecified, uncomplicated: Secondary | ICD-10-CM | POA: Insufficient documentation

## 2013-04-22 DIAGNOSIS — Z8679 Personal history of other diseases of the circulatory system: Secondary | ICD-10-CM | POA: Insufficient documentation

## 2013-04-22 LAB — URINALYSIS, ROUTINE W REFLEX MICROSCOPIC
Glucose, UA: NEGATIVE mg/dL
Hgb urine dipstick: NEGATIVE
Ketones, ur: 15 mg/dL — AB
Protein, ur: NEGATIVE mg/dL
Specific Gravity, Urine: 1.025 (ref 1.005–1.030)
pH: 5 (ref 5.0–8.0)

## 2013-04-22 LAB — URINE MICROSCOPIC-ADD ON

## 2013-04-22 LAB — COMPREHENSIVE METABOLIC PANEL
ALT: 100 U/L — ABNORMAL HIGH (ref 0–53)
AST: 85 U/L — ABNORMAL HIGH (ref 0–37)
Albumin: 3.8 g/dL (ref 3.5–5.2)
Calcium: 9 mg/dL (ref 8.4–10.5)
Creatinine, Ser: 0.83 mg/dL (ref 0.50–1.35)
GFR calc non Af Amer: >90 mL/min (ref >90)
Potassium: 4.3 mEq/L (ref 3.5–5.1)
Total Protein: 7.8 g/dL (ref 6.0–8.3)

## 2013-04-22 LAB — CBC WITH DIFFERENTIAL/PLATELET
Basophils Absolute: 0 10*3/uL (ref 0.0–0.1)
Basophils Relative: 0 % (ref 0–1)
Eosinophils Absolute: 0.2 10*3/uL (ref 0.0–0.7)
Eosinophils Relative: 2 % (ref 0–5)
HCT: 40.7 % (ref 39.0–52.0)
MCHC: 34.9 g/dL (ref 30.0–36.0)
MCV: 89.8 fL (ref 78.0–100.0)
Monocytes Absolute: 0.7 10*3/uL (ref 0.1–1.0)
Platelets: 151 10*3/uL (ref 150–400)
RDW: 14.2 % (ref 11.5–15.5)

## 2013-04-22 MED ORDER — GI COCKTAIL ~~LOC~~
30.0000 mL | Freq: Once | ORAL | Status: AC
  Administered 2013-04-22: 30 mL via ORAL
  Filled 2013-04-22: qty 30

## 2013-04-22 MED ORDER — ONDANSETRON 4 MG PO TBDP
8.0000 mg | ORAL_TABLET | Freq: Once | ORAL | Status: AC
  Administered 2013-04-22: 8 mg via ORAL
  Filled 2013-04-22: qty 2

## 2013-04-22 NOTE — ED Notes (Signed)
Called patient 3 times, walked around the waiting room and no one answered.  Other nurse stated that he went out to move his car or smoke

## 2013-04-22 NOTE — ED Notes (Signed)
Unable to locate pt for triage x2

## 2013-04-22 NOTE — ED Notes (Signed)
Unable to locate pt for triage

## 2013-04-22 NOTE — ED Notes (Signed)
Pt. reports flare up of his pancreatitis this morning with nausea, vomitting and diarrhea unrelieved by OTC medications  . Denies fever or chills.

## 2013-04-23 MED ORDER — SUCRALFATE 1 G PO TABS: 1.0000 g | ORAL_TABLET | Freq: Four times a day (QID) | ORAL | Status: DC

## 2013-04-23 MED ORDER — DICYCLOMINE HCL 20 MG PO TABS: 20.0000 mg | ORAL_TABLET | Freq: Four times a day (QID) | ORAL | Status: DC | PRN

## 2013-04-23 MED ORDER — ONDANSETRON 8 MG PO TBDP: 8.0000 mg | ORAL_TABLET | Freq: Three times a day (TID) | ORAL | Status: DC | PRN

## 2013-04-23 MED ORDER — DICYCLOMINE HCL 10 MG/ML IM SOLN
20.0000 mg | Freq: Once | INTRAMUSCULAR | Status: AC
  Administered 2013-04-23: 20 mg via INTRAMUSCULAR
  Filled 2013-04-23: qty 2

## 2013-04-23 NOTE — ED Provider Notes (Signed)
CSN: [id number]     Arrival date & time 04/22/13  1907 History   First MD Initiated Contact with Patient 04/22/13 2302     Chief Complaint  Patient presents with  . Pancreatitis   (Consider location/radiation/quality/duration/timing/severity/associated sxs/prior Treatment) HPI 42 yo male presents to the ER from home with complaint of burning upper abdominal pain associated with n/v/d.  No fevers.  Pt is concerned he may have return of pancreatitis.  He denies any recent unusual foods, sick contacts, or alcohol use.  Pt has been taking his medications as prescribed, but reports he is out of his additional stomach medication (unknown name) that he was taking with his omeprazole.  No blood in emesis or stool.   Past Medical History  Diagnosis Date  . Hypertension   . Asthma   . Pancreatitis   . Cocaine abuse   . Depression   . H/O suicide attempt   . Heart murmur     "when he was little" (03/06/2013)  . Shortness of breath     "can happen at anytime" (03/06/2013)  . Anemia   . H/O hiatal hernia   . GERD (gastroesophageal reflux disease)   . Seizures     "weekly" (03/06/2013)  . Arthritis     "knees" (03/06/2013)  . Anxiety   . WPW (Wolff-Parkinson-White syndrome)     [name] 03/06/2013   Past Surgical History  Procedure Laterality Date  . Facial fracture surgery Left 1990's    "result of trauma" (03/06/2013)  . Eye surgery Left 1990's    "result of trauma" (03/06/2013)   Family History  Problem Relation Age of Onset  . Hypertension Other   . Coronary artery disease Other    History  Substance Use Topics  . Smoking status: Current Every Day Smoker -- 1.00 packs/day for 30 years    Types: Cigarettes  . Smokeless tobacco: Current User  . Alcohol Use: 43.2 oz/week    70 Cans of beer, 2 Shots of liquor per week     Comment: 03/06/2013 "3, 40oz/day; plus couple shots liquor once/wk"    Review of Systems  All other systems reviewed and are negative.    Allergies   Trazodone and nefazodone and Shellfish-derived products  Home Medications   Current Outpatient Rx  Name  Route  Sig  Dispense  Refill  . albuterol (PROVENTIL HFA;VENTOLIN HFA) 108 (90 BASE) MCG/ACT inhaler   Inhalation   Inhale 1-2 puffs into the lungs every 6 (six) hours as needed for wheezing or shortness of breath.         . cloNIDine (CATAPRES) 0.2 MG tablet   Oral   Take 0.2 mg by mouth 2 (two) times daily.         . Multiple Vitamins-Minerals (MULTIVITAMIN PO)   Oral   Take 1 tablet by mouth daily.         [name] omeprazole (PRILOSEC) 20 MG capsule   Oral   Take 20 mg by mouth 2 (two) times daily before a meal.         . QUEtiapine Fumarate (SEROQUEL XR) 150 MG 24 hr tablet   Oral   Take 150 mg by mouth at bedtime.         . dicyclomine (BENTYL) 20 MG tablet   Oral   Take 1 tablet (20 mg total) by mouth every 6 (six) hours as needed for spasms (for abdominal cramping).   20 tablet   0   . ondansetron (ZOFRAN ODT) 8  MG disintegrating tablet   Oral   Take 1 tablet (8 mg total) by mouth every 8 (eight) hours as needed for nausea or vomiting.   20 tablet   0   . sucralfate (CARAFATE) 1 G tablet   Oral   Take 1 tablet (1 g total) by mouth 4 (four) times daily.   30 tablet   0    BP 141/90  Pulse 96  Temp(Src) 98.4 F (36.9 C) (Oral)  Resp 14  Ht 5\' 9"  (1.753 m)  Wt 145 lb (65.772 kg)  BMI 21.40 kg/m2  SpO2 99% Physical Exam  Nursing note and vitals reviewed. Constitutional: He is oriented to person, place, and time. He appears well-developed and well-nourished. No distress.  HENT:  Head: Normocephalic and atraumatic.  Nose: Nose normal.  Mouth/Throat: Oropharynx is clear and moist.  Eyes: Conjunctivae and EOM are normal. Pupils are equal, round, and reactive to light.  Neck: Normal range of motion. Neck supple. No JVD present. No tracheal deviation present. No thyromegaly present.  Cardiovascular: Normal rate, regular rhythm, normal heart sounds  and intact distal pulses.  Exam reveals no gallop and no friction rub.   No murmur heard. Pulmonary/Chest: Effort normal and breath sounds normal. No stridor. No respiratory distress. He has no wheezes. He has no rales. He exhibits no tenderness.  Abdominal: Soft. He exhibits no distension and no mass. There is tenderness. There is no rebound and no guarding.  Hyperactive bowel sounds.  Epigastric pain with palpation  Musculoskeletal: Normal range of motion. He exhibits no edema and no tenderness.  Lymphadenopathy:    He has no cervical adenopathy.  Neurological: He is oriented to person, place, and time. He exhibits normal muscle tone. Coordination normal.  Skin: Skin is warm and dry. No rash noted. No erythema. No pallor.  Psychiatric: He has a normal mood and affect. His behavior is normal. Judgment and thought content normal.    ED Course  Procedures (including critical care time) Labs Review Labs Reviewed  COMPREHENSIVE METABOLIC PANEL - Abnormal; Notable for the following:    Glucose, Bld 63 (*)    AST 85 (*)    ALT 100 (*)    All other components within normal limits  LIPASE, BLOOD - Abnormal; Notable for the following:    Lipase 63 (*)    All other components within normal limits  URINALYSIS, ROUTINE W REFLEX MICROSCOPIC - Abnormal; Notable for the following:    Ketones, ur 15 (*)    Leukocytes, UA SMALL (*)    All other components within normal limits  URINE MICROSCOPIC-ADD ON - Abnormal; Notable for the following:    Bacteria, UA FEW (*)    Casts HYALINE CASTS (*)    All other components within normal limits  CBC WITH DIFFERENTIAL   Imaging Review No results found.  EKG Interpretation   None       MDM   1. Nausea vomiting and diarrhea    42 yo male with one day of nvd, epigastric pain.  No signs of pancreatitis on labs, mild epigastric pain improved after GI cocktail.  No further vomiting.  Will d/c with zofran and bentyl.    Olivia Mackie, MD 04/23/13  2116

## 2013-05-16 ENCOUNTER — Emergency Department (HOSPITAL_COMMUNITY)
Admission: EM | Admit: 2013-05-16 | Discharge: 2013-05-17 | Disposition: A | Discharge status: Home / Self Care | Payer: Self-pay | Attending: Emergency Medicine | Admitting: Emergency Medicine

## 2013-05-16 ENCOUNTER — Encounter (HOSPITAL_COMMUNITY): Payer: Self-pay | Admitting: Emergency Medicine

## 2013-05-16 DIAGNOSIS — R000 Tachycardia, unspecified: Secondary | ICD-10-CM | POA: Insufficient documentation

## 2013-05-16 DIAGNOSIS — F329 Major depressive disorder, single episode, unspecified: Secondary | ICD-10-CM | POA: Insufficient documentation

## 2013-05-16 DIAGNOSIS — M1710 Unilateral primary osteoarthritis, unspecified knee: Secondary | ICD-10-CM | POA: Insufficient documentation

## 2013-05-16 DIAGNOSIS — F3289 Other specified depressive episodes: Secondary | ICD-10-CM | POA: Insufficient documentation

## 2013-05-16 DIAGNOSIS — R5381 Other malaise: Secondary | ICD-10-CM | POA: Insufficient documentation

## 2013-05-16 DIAGNOSIS — R599 Enlarged lymph nodes, unspecified: Secondary | ICD-10-CM | POA: Insufficient documentation

## 2013-05-16 DIAGNOSIS — I10 Essential (primary) hypertension: Secondary | ICD-10-CM | POA: Insufficient documentation

## 2013-05-16 DIAGNOSIS — H9209 Otalgia, unspecified ear: Secondary | ICD-10-CM | POA: Insufficient documentation

## 2013-05-16 DIAGNOSIS — K219 Gastro-esophageal reflux disease without esophagitis: Secondary | ICD-10-CM | POA: Insufficient documentation

## 2013-05-16 DIAGNOSIS — F411 Generalized anxiety disorder: Secondary | ICD-10-CM | POA: Insufficient documentation

## 2013-05-16 DIAGNOSIS — R5383 Other fatigue: Secondary | ICD-10-CM

## 2013-05-16 DIAGNOSIS — G40909 Epilepsy, unspecified, not intractable, without status epilepticus: Secondary | ICD-10-CM | POA: Insufficient documentation

## 2013-05-16 DIAGNOSIS — R69 Illness, unspecified: Secondary | ICD-10-CM

## 2013-05-16 DIAGNOSIS — J45909 Unspecified asthma, uncomplicated: Secondary | ICD-10-CM | POA: Insufficient documentation

## 2013-05-16 DIAGNOSIS — IMO0002 Reserved for concepts with insufficient information to code with codable children: Secondary | ICD-10-CM

## 2013-05-16 DIAGNOSIS — R011 Cardiac murmur, unspecified: Secondary | ICD-10-CM | POA: Insufficient documentation

## 2013-05-16 DIAGNOSIS — F17200 Nicotine dependence, unspecified, uncomplicated: Secondary | ICD-10-CM | POA: Insufficient documentation

## 2013-05-16 DIAGNOSIS — Z79899 Other long term (current) drug therapy: Secondary | ICD-10-CM | POA: Insufficient documentation

## 2013-05-16 DIAGNOSIS — Z862 Personal history of diseases of the blood and blood-forming organs and certain disorders involving the immune mechanism: Secondary | ICD-10-CM | POA: Insufficient documentation

## 2013-05-16 DIAGNOSIS — J111 Influenza due to unidentified influenza virus with other respiratory manifestations: Secondary | ICD-10-CM | POA: Insufficient documentation

## 2013-05-16 LAB — RAPID STREP SCREEN (MED CTR MEBANE ONLY): STREPTOCOCCUS, GROUP A SCREEN (DIRECT): NEGATIVE

## 2013-05-16 MED ORDER — HYDROCODONE-ACETAMINOPHEN 5-325 MG PO TABS
2.0000 | ORAL_TABLET | Freq: Once | ORAL | Status: AC
  Administered 2013-05-16: 2 via ORAL
  Filled 2013-05-16: qty 2

## 2013-05-16 MED ORDER — SODIUM CHLORIDE 0.9 % IV BOLUS (SEPSIS)
1000.0000 mL | Freq: Once | INTRAVENOUS | Status: AC
  Administered 2013-05-16: 1000 mL via INTRAVENOUS

## 2013-05-16 MED ORDER — OSELTAMIVIR PHOSPHATE 75 MG PO CAPS: 75.0000 mg | ORAL_CAPSULE | Freq: Two times a day (BID) | ORAL | Status: DC

## 2013-05-16 NOTE — ED Notes (Signed)
The pt is c/o a headache sorethroat and earaches all day

## 2013-05-16 NOTE — ED Provider Notes (Signed)
CSN: [id number]     Arrival date & time 05/16/13  1947 History   First MD Initiated Contact with Patient 05/16/13 2240     Chief Complaint  Patient presents with  . Headache   (Consider location/radiation/quality/duration/timing/severity/associated sxs/prior Treatment) The history is provided by the patient and medical records. No language interpreter was used.    [name] is a 43 y.o. male  with a hx of HTN, asthma, pancreatitis, cocaine abuse, migraine headaches, WPW, anxiety presents to the Emergency Department complaining of gradual, persistent, progressively worsening sore throat with associated headache, otalgia, lymphadenopathy onset this AM.  Pt reports taking theraflu without relief.  Associated symptoms also include rhinorrhea, nausea without vomiting, rhinorrhea, nasal congestion.  Nothing notmakes it better and nothing makes it worse.  Pt denies fever, chills, neck pain, chest pain, SOB, abd pain, vomiting, diarrhea, weakness, dizziness, syncope.  Pt denies sick contacts and denies getting a flu shot.     Past Medical History  Diagnosis Date  . Hypertension   . Asthma   . Pancreatitis   . Cocaine abuse   . Depression   . H/O suicide attempt   . Heart murmur     "when he was little" (03/06/2013)  . Shortness of breath     "can happen at anytime" (03/06/2013)  . Anemia   . H/O hiatal hernia   . GERD (gastroesophageal reflux disease)   . Seizures     "weekly" (03/06/2013)  . Arthritis     "knees" (03/06/2013)  . Anxiety   . WPW (Wolff-Parkinson-White syndrome)     [name] 03/06/2013   Past Surgical History  Procedure Laterality Date  . Facial fracture surgery Left 1990's    "result of trauma" (03/06/2013)  . Eye surgery Left 1990's    "result of trauma" (03/06/2013)   Family History  Problem Relation Age of Onset  . Hypertension Other   . Coronary artery disease Other    History  Substance Use Topics  . Smoking status: Current Every Day Smoker -- 1.00  packs/day for 30 years    Types: Cigarettes  . Smokeless tobacco: Current User  . Alcohol Use: 43.2 oz/week    70 Cans of beer, 2 Shots of liquor per week     Comment: 03/06/2013 "3, 40oz/day; plus couple shots liquor once/wk"    Review of Systems  Constitutional: Positive for fatigue. Negative for fever, chills and appetite change.  HENT: Positive for congestion, postnasal drip, rhinorrhea, sinus pressure and sore throat. Negative for ear discharge, ear pain and mouth sores.   Eyes: Negative for visual disturbance.  Respiratory: Positive for cough. Negative for chest tightness, shortness of breath, wheezing and stridor.   Cardiovascular: Negative for chest pain, palpitations and leg swelling.  Gastrointestinal: Negative for nausea, vomiting, abdominal pain and diarrhea.  Genitourinary: Negative for dysuria, urgency, frequency and hematuria.  Musculoskeletal: Negative for arthralgias, back pain, myalgias and neck stiffness.  Skin: Negative for rash.  Neurological: Positive for headaches. Negative for syncope, light-headedness and numbness.  Hematological: Negative for adenopathy.  Psychiatric/Behavioral: The patient is not nervous/anxious.   All other systems reviewed and are negative.    Allergies  Trazodone and nefazodone and Shellfish-derived products  Home Medications   Current Outpatient Rx  Name  Route  Sig  Dispense  Refill  . albuterol (PROVENTIL HFA;VENTOLIN HFA) 108 (90 BASE) MCG/ACT inhaler   Inhalation   Inhale 1-2 puffs into the lungs every 6 (six) hours as needed for wheezing or shortness of  breath.         . cloNIDine (CATAPRES) 0.2 MG tablet   Oral   Take 0.2 mg by mouth 2 (two) times daily.         [name] ibuprofen (ADVIL,MOTRIN) 200 MG tablet   Oral   Take 800 mg by mouth every 8 (eight) hours as needed for moderate pain.         . Multiple Vitamins-Minerals (MULTIVITAMIN PO)   Oral   Take 1 tablet by mouth daily.         [name] omeprazole (PRILOSEC) 20  MG capsule   Oral   Take 20 mg by mouth 2 (two) times daily before a meal.         . QUEtiapine Fumarate (SEROQUEL XR) 150 MG 24 hr tablet   Oral   Take 150 mg by mouth at bedtime.         [name] oseltamivir (TAMIFLU) 75 MG capsule   Oral   Take 1 capsule (75 mg total) by mouth every 12 (twelve) hours.   10 capsule   0    BP 152/97  Pulse 94  Temp(Src) 97.6 F (36.4 C) (Oral)  Resp 16  SpO2 99% Physical Exam  Nursing note and vitals reviewed. Constitutional: He is oriented to person, place, and time. He appears well-developed and well-nourished. No distress.  Awake, alert, nontoxic appearance  HENT:  Head: Normocephalic and atraumatic.  Right Ear: Tympanic membrane, external ear and ear canal normal.  Left Ear: Tympanic membrane, external ear and ear canal normal.  Nose: Mucosal edema and rhinorrhea present. No epistaxis. Right sinus exhibits no maxillary sinus tenderness and no frontal sinus tenderness. Left sinus exhibits no maxillary sinus tenderness and no frontal sinus tenderness.  Mouth/Throat: Uvula is midline and mucous membranes are normal. Mucous membranes are not pale and not cyanotic. Posterior oropharyngeal erythema present. No oropharyngeal exudate, posterior oropharyngeal edema or tonsillar abscesses.  Mild posterior oropharyngeal erythema; no edema or exudate  Eyes: Conjunctivae are normal. Pupils are equal, round, and reactive to light. No scleral icterus.  Neck: Trachea normal, normal range of motion, full passive range of motion without pain and phonation normal. Neck supple. No spinous process tenderness and no muscular tenderness present. No rigidity. Normal range of motion present.  Phonation normal No nuchal rigidity No meningeal signs  Cardiovascular: Regular rhythm, normal heart sounds and intact distal pulses.   Mild tachycardia at 100  Pulmonary/Chest: Effort normal and breath sounds normal. No stridor. No respiratory distress. He has no wheezes.   Abdominal: Soft. Bowel sounds are normal. He exhibits no mass. There is no tenderness. There is no rebound and no guarding.  Musculoskeletal: Normal range of motion. He exhibits no edema.  Lymphadenopathy:       Head (right side): Submandibular and tonsillar adenopathy present. No submental, no preauricular, no posterior auricular and no occipital adenopathy present.       Head (left side): Submandibular and tonsillar adenopathy present. No submental, no preauricular, no posterior auricular and no occipital adenopathy present.    He has no cervical adenopathy.  Neurological: He is alert and oriented to person, place, and time. He has normal reflexes. No cranial nerve deficit. He exhibits normal muscle tone. Coordination normal.  Speech is clear and goal oriented, follows commands Major Cranial nerves without deficit, no facial droop Normal strength in upper and lower extremities bilaterally including dorsiflexion and plantar flexion, strong and equal grip strength Sensation normal to light and sharp touch Moves extremities without  ataxia, coordination intact Normal finger to nose and rapid alternating movements Neg romberg, no pronator drift Normal gait and balance  Skin: Skin is warm and dry. No rash noted. He is not diaphoretic. No erythema.  No petechiae purpura  Psychiatric: He has a normal mood and affect. His behavior is normal.    ED Course  Procedures (including critical care time) Labs Review Labs Reviewed  RAPID STREP SCREEN  CULTURE, GROUP A STREP   Imaging Review No results found.  EKG Interpretation   None       MDM   1. Influenza-like illness      [name] presents with influenza like illness.  Negative strep test.   Patient with symptoms consistent with influenza.  Vitals are stable, low-grade fever.  No signs of dehydration, tolerating PO's.  Lungs are clear. Due to patient's presentation and physical exam a chest x-ray was not ordered bc likely  diagnosis of flu.  Discussed the cost versus benefit of Tamiflu treatment with the patient.  Patient given fluid bolus with air in the emergency department and pain control for his headache.   Pt HA treated and improved while in ED.  Presentation is like pts typical HA and non concerning for [facility], ICH, Meningitis, or temporal arteritis. Pt is afebrile with no focal neuro deficits, nuchal rigidity, or change in vision.   Patient will be discharged with instructions to orally hydrate, rest, and use over-the-counter medications such as anti-inflammatories ibuprofen and Aleve for muscle aches and Tylenol for fever.    It has been determined that no acute conditions requiring further emergency intervention are present at this time. The patient/guardian have been advised of the diagnosis and plan. We have discussed signs and symptoms that warrant return to the ED, such as changes or worsening in symptoms.   Vital signs are stable at discharge.   BP 158/94  Pulse 98  Temp(Src) 97.6 F (36.4 C) (Oral)  Resp 14  SpO2 100%  Patient/guardian has voiced understanding and agreed to follow-up with the PCP or specialist.         Abigail Butts, PA-C 05/17/13 0036

## 2013-05-16 NOTE — Discharge Instructions (Signed)
1. Medications: Tamiflu, usual home medications 2. Treatment: rest, drink plenty of fluids,  3. Follow Up: Please followup with your primary doctor for discussion of your diagnoses and further evaluation after today's visit; if you do not have a primary care doctor use the resource guide provided to find one;   Influenza, Adult Influenza ("the flu") is a viral infection of the respiratory tract. It occurs more often in winter months because people spend more time in close contact with one another. Influenza can make you feel very sick. Influenza easily spreads from person to person (contagious). CAUSES  Influenza is caused by a virus that infects the respiratory tract. You can catch the virus by breathing in droplets from an infected person's cough or sneeze. You can also catch the virus by touching something that was recently contaminated with the virus and then touching your mouth, nose, or eyes. SYMPTOMS  Symptoms typically last 4 to 10 days and may include:  Fever.  Chills.  Headache, body aches, and muscle aches.  Sore throat.  Chest discomfort and cough.  Poor appetite.  Weakness or feeling tired.  Dizziness.  Nausea or vomiting. DIAGNOSIS  Diagnosis of influenza is often made based on your history and a physical exam. A nose or throat swab test can be done to confirm the diagnosis. RISKS AND COMPLICATIONS You may be at risk for a more severe case of influenza if you smoke cigarettes, have diabetes, have chronic heart disease (such as heart failure) or lung disease (such as asthma), or if you have a weakened immune system. Elderly people and pregnant women are also at risk for more serious infections. The most common complication of influenza is a lung infection (pneumonia). Sometimes, this complication can require emergency medical care and may be life-threatening. PREVENTION  An annual influenza vaccination (flu shot) is the best way to avoid getting influenza. An annual flu  shot is now routinely recommended for all adults in the [place] TREATMENT  In mild cases, influenza goes away on its own. Treatment is directed at relieving symptoms. For more severe cases, your caregiver may prescribe antiviral medicines to shorten the sickness. Antibiotic medicines are not effective, because the infection is caused by a virus, not by bacteria. HOME CARE INSTRUCTIONS  Only take over-the-counter or prescription medicines for pain, discomfort, or fever as directed by your caregiver.  Use a cool mist humidifier to make breathing easier.  Get plenty of rest until your temperature returns to normal. This usually takes 3 to 4 days.  Drink enough fluids to keep your urine clear or pale yellow.  Cover your mouth and nose when coughing or sneezing, and wash your hands well to avoid spreading the virus.  Stay home from work or school until your fever has been gone for at least 1 full day. SEEK MEDICAL CARE IF:   You have chest pain or a deep cough that worsens or produces more mucus.  You have nausea, vomiting, or diarrhea. SEEK IMMEDIATE MEDICAL CARE IF:   You have difficulty breathing, shortness of breath, or your skin or nails turn bluish.  You have severe neck pain or stiffness.  You have a severe headache, facial pain, or earache.  You have a worsening or recurring fever.  You have nausea or vomiting that cannot be controlled. MAKE SURE YOU:  Understand these instructions.  Will watch your condition.  Will get help right away if you are not doing well or get worse. Document Released: 04/22/2000 Document Revised: 10/25/2011 Document  Reviewed: 07/25/2011 Exit[employer] Patient Information 2014 [place], [place].

## 2013-05-16 NOTE — ED Notes (Signed)
Pt reports developing a sore throat, drainage, and headache this am. States that he took Nyquil around 10am without relief. Pt rates h/a 9/10. Pt has history or HTN, has not taken BP meds due to impotency reasons. Requesting ER Doc to prescribe him another BP med before he sees [facility] next month.

## 2013-05-17 MED ORDER — OXYCODONE HCL 5 MG/5ML PO SOLN
5.0000 mg | ORAL | Status: DC | PRN
Start: 2013-05-17 — End: 2013-08-08

## 2013-05-17 NOTE — ED Provider Notes (Signed)
Medical screening examination/treatment/procedure(s) were performed by non-physician practitioner and as supervising physician I was immediately available for consultation/collaboration.   Delora Fuel, MD 26/37/85 8850

## 2013-05-18 LAB — CULTURE, GROUP A STREP

## 2013-08-08 ENCOUNTER — Emergency Department (HOSPITAL_COMMUNITY): Payer: Self-pay

## 2013-08-08 ENCOUNTER — Encounter (HOSPITAL_COMMUNITY): Payer: Self-pay | Admitting: Emergency Medicine

## 2013-08-08 ENCOUNTER — Other Ambulatory Visit: Payer: Self-pay

## 2013-08-08 ENCOUNTER — Emergency Department (HOSPITAL_COMMUNITY)
Admission: EM | Admit: 2013-08-08 | Discharge: 2013-08-08 | Disposition: A | Discharge status: Home / Self Care | Payer: Self-pay | Attending: Emergency Medicine | Admitting: Emergency Medicine

## 2013-08-08 DIAGNOSIS — M1710 Unilateral primary osteoarthritis, unspecified knee: Secondary | ICD-10-CM | POA: Insufficient documentation

## 2013-08-08 DIAGNOSIS — IMO0002 Reserved for concepts with insufficient information to code with codable children: Secondary | ICD-10-CM

## 2013-08-08 DIAGNOSIS — F17200 Nicotine dependence, unspecified, uncomplicated: Secondary | ICD-10-CM | POA: Insufficient documentation

## 2013-08-08 DIAGNOSIS — F411 Generalized anxiety disorder: Secondary | ICD-10-CM | POA: Insufficient documentation

## 2013-08-08 DIAGNOSIS — Z8669 Personal history of other diseases of the nervous system and sense organs: Secondary | ICD-10-CM | POA: Insufficient documentation

## 2013-08-08 DIAGNOSIS — F3289 Other specified depressive episodes: Secondary | ICD-10-CM | POA: Insufficient documentation

## 2013-08-08 DIAGNOSIS — J45909 Unspecified asthma, uncomplicated: Secondary | ICD-10-CM | POA: Insufficient documentation

## 2013-08-08 DIAGNOSIS — K219 Gastro-esophageal reflux disease without esophagitis: Secondary | ICD-10-CM | POA: Insufficient documentation

## 2013-08-08 DIAGNOSIS — Z862 Personal history of diseases of the blood and blood-forming organs and certain disorders involving the immune mechanism: Secondary | ICD-10-CM | POA: Insufficient documentation

## 2013-08-08 DIAGNOSIS — R0789 Other chest pain: Secondary | ICD-10-CM

## 2013-08-08 DIAGNOSIS — R011 Cardiac murmur, unspecified: Secondary | ICD-10-CM | POA: Insufficient documentation

## 2013-08-08 DIAGNOSIS — I10 Essential (primary) hypertension: Secondary | ICD-10-CM | POA: Insufficient documentation

## 2013-08-08 DIAGNOSIS — Z79899 Other long term (current) drug therapy: Secondary | ICD-10-CM | POA: Insufficient documentation

## 2013-08-08 DIAGNOSIS — F329 Major depressive disorder, single episode, unspecified: Secondary | ICD-10-CM | POA: Insufficient documentation

## 2013-08-08 DIAGNOSIS — R071 Chest pain on breathing: Secondary | ICD-10-CM | POA: Insufficient documentation

## 2013-08-08 LAB — CBC WITH DIFFERENTIAL/PLATELET
Basophils Absolute: 0 K/uL (ref 0.0–0.1)
Basophils Relative: 0 % (ref 0–1)
Eosinophils Absolute: 0.1 K/uL (ref 0.0–0.7)
Eosinophils Relative: 2 % (ref 0–5)
HCT: 29.6 % — ABNORMAL LOW (ref 39.0–52.0)
Hemoglobin: 10.3 g/dL — ABNORMAL LOW (ref 13.0–17.0)
Lymphocytes Relative: 41 % (ref 12–46)
Lymphs Abs: 3.4 K/uL (ref 0.7–4.0)
MCH: 33.8 pg (ref 26.0–34.0)
MCHC: 34.8 g/dL (ref 30.0–36.0)
MCV: 97 fL (ref 78.0–100.0)
Monocytes Absolute: 0.5 K/uL (ref 0.1–1.0)
Monocytes Relative: 6 % (ref 3–12)
Neutro Abs: 4.2 K/uL (ref 1.7–7.7)
Neutrophils Relative %: 51 % (ref 43–77)
Platelets: 117 K/uL — ABNORMAL LOW (ref 150–400)
RBC: 3.05 MIL/uL — ABNORMAL LOW (ref 4.22–5.81)
RDW: 16.1 % — ABNORMAL HIGH (ref 11.5–15.5)
WBC: 8.3 K/uL (ref 4.0–10.5)

## 2013-08-08 LAB — COMPREHENSIVE METABOLIC PANEL WITH GFR
ALT: 118 U/L — ABNORMAL HIGH (ref 0–53)
AST: 157 U/L — ABNORMAL HIGH (ref 0–37)
Albumin: 2.7 g/dL — ABNORMAL LOW (ref 3.5–5.2)
Alkaline Phosphatase: 110 U/L (ref 39–117)
BUN: 9 mg/dL (ref 6–23)
CO2: 19 meq/L (ref 19–32)
Calcium: 8.3 mg/dL — ABNORMAL LOW (ref 8.4–10.5)
Chloride: 104 meq/L (ref 96–112)
Creatinine, Ser: 0.65 mg/dL (ref 0.50–1.35)
GFR calc Af Amer: >90 mL/min
GFR calc non Af Amer: >90 mL/min
Glucose, Bld: 80 mg/dL (ref 70–99)
Potassium: 4.2 meq/L (ref 3.7–5.3)
Sodium: 140 meq/L (ref 137–147)
Total Bilirubin: 0.6 mg/dL (ref 0.3–1.2)
Total Protein: 6.1 g/dL (ref 6.0–8.3)

## 2013-08-08 LAB — RAPID URINE DRUG SCREEN, HOSP PERFORMED
Amphetamines: NOT DETECTED
Barbiturates: NOT DETECTED
Benzodiazepines: NOT DETECTED
Cocaine: NOT DETECTED
Opiates: NOT DETECTED
Tetrahydrocannabinol: NOT DETECTED

## 2013-08-08 LAB — ETHANOL: Alcohol, Ethyl (B): 228 mg/dL — ABNORMAL HIGH (ref 0–11)

## 2013-08-08 MED ORDER — KETOROLAC TROMETHAMINE 30 MG/ML IJ SOLN
30.0000 mg | Freq: Once | INTRAMUSCULAR | Status: AC
  Administered 2013-08-08: 30 mg via INTRAVENOUS
  Filled 2013-08-08: qty 1

## 2013-08-08 MED ORDER — SODIUM CHLORIDE 0.9 % IV SOLN
INTRAVENOUS | Status: DC
  Administered 2013-08-08: 18:00:00 via INTRAVENOUS

## 2013-08-08 MED ORDER — HYDROCODONE-ACETAMINOPHEN 5-325 MG PO TABS: 1.0000 | ORAL_TABLET | ORAL | Status: DC | PRN

## 2013-08-08 MED ORDER — METHOCARBAMOL 750 MG PO TABS: 750.0000 mg | ORAL_TABLET | Freq: Four times a day (QID) | ORAL | Status: DC

## 2013-08-08 NOTE — ED Notes (Signed)
Per EMS pt from home c/o left sided chest pain non-radiating started at 12pm. Pt sts 1 episode of emesis at 2pm. Denies SOB, diaphoresis. Endorses dizziness. Pt has had 324 ASA and 2 nitro without relief- 8/10. Tender to palpation. Pt has hx of wolfe-parkinsons white syndrome. Pt has asthma with increase cough and white mucus output. Pt has had 4 beers to numb pain without relief.

## 2013-08-08 NOTE — ED Notes (Signed)
Patient pressed call light again asking for pain meds. Explained to patient that we can not give pain meds if they are not ordered.

## 2013-08-08 NOTE — ED Notes (Signed)
Pt is complaining about pain and the lack of medication received. Pt is stating to leave.

## 2013-08-08 NOTE — ED Provider Notes (Addendum)
CSN: [id number]     Arrival date & time 08/08/13  1718 History   First MD Initiated Contact with Patient 08/08/13 1729     Chief Complaint  Patient presents with  . Chest Pain     (Consider location/radiation/quality/duration/timing/severity/associated sxs/prior Treatment) Patient is a 43 y.o. male presenting with chest pain. The history is provided by the patient.  Chest Pain  patient here complaining of pinpoint left-sided chest pain which began around 12:00 today. Pain characterized as sharp and worse with palpation or moving around. Denies any pleuritic component to this. Review of old records show that the patient had a normal heart catheterization back in October of 2014. Patient's pain today was nonradiating and not associated with dyspnea or diaphoresis. No recent cough. Does have a history of asthma and suffers from seasonal allergies. Patient used beer to help relieve his pain. He also took aspirin. EMS was called and gave the patient 2 nitroglycerin which did not change his pain. Denies any leg pain or swelling. No syncope or near-syncope.  Past Medical History  Diagnosis Date  . Hypertension   . Asthma   . Pancreatitis   . Cocaine abuse   . Depression   . H/O suicide attempt   . Heart murmur     "when he was little" (03/06/2013)  . Shortness of breath     "can happen at anytime" (03/06/2013)  . Anemia   . H/O hiatal hernia   . GERD (gastroesophageal reflux disease)   . Seizures     "weekly" (03/06/2013)  . Arthritis     "knees" (03/06/2013)  . Anxiety   . WPW (Wolff-Parkinson-White syndrome)     [name] 03/06/2013   Past Surgical History  Procedure Laterality Date  . Facial fracture surgery Left 1990's    "result of trauma" (03/06/2013)  . Eye surgery Left 1990's    "result of trauma" (03/06/2013)   Family History  Problem Relation Age of Onset  . Hypertension Other   . Coronary artery disease Other    History  Substance Use Topics  . Smoking status:  Current Every Day Smoker -- 1.00 packs/day for 30 years    Types: Cigarettes  . Smokeless tobacco: Current User  . Alcohol Use: 25.2 oz/week    2 Shots of liquor, 40 Cans of beer per week     Comment: 03/06/2013 "1, 40oz/day; plus couple shots liquor once/wk"    Review of Systems  Cardiovascular: Positive for chest pain.  All other systems reviewed and are negative.      Allergies  Trazodone and nefazodone and Shellfish-derived products  Home Medications   Current Outpatient Rx  Name  Route  Sig  Dispense  Refill  . albuterol (PROVENTIL HFA;VENTOLIN HFA) 108 (90 BASE) MCG/ACT inhaler   Inhalation   Inhale 1-2 puffs into the lungs every 6 (six) hours as needed for wheezing or shortness of breath.         . cloNIDine (CATAPRES) 0.2 MG tablet   Oral   Take 0.2 mg by mouth 2 (two) times daily.         [name] ibuprofen (ADVIL,MOTRIN) 200 MG tablet   Oral   Take 800 mg by mouth every 8 (eight) hours as needed for moderate pain.         . Multiple Vitamins-Minerals (MULTIVITAMIN PO)   Oral   Take 1 tablet by mouth daily.         [name] omeprazole (PRILOSEC) 20 MG capsule   Oral  Take 20 mg by mouth 2 (two) times daily before a meal.         . oseltamivir (TAMIFLU) 75 MG capsule   Oral   Take 1 capsule (75 mg total) by mouth every 12 (twelve) hours.   10 capsule   0   . oxyCODONE (ROXICODONE) 5 MG/5ML solution   Oral   Take 5 mLs (5 mg total) by mouth every 4 (four) hours as needed for severe pain.   35 mL   0   . QUEtiapine Fumarate (SEROQUEL XR) 150 MG 24 hr tablet   Oral   Take 150 mg by mouth at bedtime.          BP 123/73  Pulse 118  Temp(Src) 98.6 F (37 C) (Oral)  Resp 18  Ht 5\' 9"  (1.753 m)  Wt 147 lb (66.679 kg)  BMI 21.70 kg/m2  SpO2 100% Physical Exam  Nursing note and vitals reviewed. Constitutional: He is oriented to person, place, and time. He appears well-developed and well-nourished.  Non-toxic appearance. No distress.  HENT:  Head:  Normocephalic and atraumatic.  Eyes: Conjunctivae, EOM and lids are normal. Pupils are equal, round, and reactive to light.  Neck: Normal range of motion. Neck supple. No tracheal deviation present. No mass present.  Cardiovascular: Normal rate, regular rhythm and normal heart sounds.  Exam reveals no gallop.   No murmur heard. Pulmonary/Chest: Effort normal and breath sounds normal. No stridor. No respiratory distress. He has no decreased breath sounds. He has no wheezes. He has no rhonchi. He has no rales.    Abdominal: Soft. Normal appearance and bowel sounds are normal. He exhibits no distension. There is no tenderness. There is no rebound and no CVA tenderness.  Musculoskeletal: Normal range of motion. He exhibits no edema and no tenderness.  Neurological: He is alert and oriented to person, place, and time. He has normal strength. No cranial nerve deficit or sensory deficit. GCS eye subscore is 4. GCS verbal subscore is 5. GCS motor subscore is 6.  Skin: Skin is warm and dry. No abrasion and no rash noted.  Psychiatric: He has a normal mood and affect. His speech is normal and behavior is normal.    ED Course  Procedures (including critical care time) Labs Review Labs Reviewed  ETHANOL - Abnormal; Notable for the following:    Alcohol, Ethyl (B) 228 (*)    All other components within normal limits  CBC WITH DIFFERENTIAL - Abnormal; Notable for the following:    RBC 3.05 (*)    Hemoglobin 10.3 (*)    HCT 29.6 (*)    RDW 16.1 (*)    Platelets 117 (*)    All other components within normal limits  COMPREHENSIVE METABOLIC PANEL - Abnormal; Notable for the following:    Calcium 8.3 (*)    Albumin 2.7 (*)    AST 157 (*)    ALT 118 (*)    All other components within normal limits  URINE RAPID DRUG SCREEN (HOSP PERFORMED)  TROPONIN I   Imaging Review No results found.   EKG Interpretation   Date/Time:  Thursday August 08 2013 17:26:08 EDT Ventricular Rate:  101 PR Interval:   104 QRS Duration: 71 QT Interval:  292 QTC Calculation: 378 R Axis:   79 Text Interpretation:  Sinus tachycardia Nonspecific T abnormalities,  diffuse leads No significant change since last tracing Confirmed by Ndidi Nesby   MD, Burnie Hank ([id number]) on 08/08/2013 5:42:48 PM  MDM   Final diagnoses:  None    Patient with pinpoint chest wall pain. No concern for pulmonary embolism. Review his old chart shows a negative heart catheterization. Patient's alcohol level noted. Receiving Toradol for pain. He became belligerent and threatening to staff and demanding more pain medication. Due to his alcohol level I will not be giving the patient IV pain medication in the form of opiates at this time. I will give him a prescription for muscle accidents as well as a short course of hydrocodone to home with some long as the prescriptions are given to responsible adults with instructions to not take medication until tomorrow. Have no concern for ACS at this time.    Leota Jacobsen, MD 08/08/13 1941  Leota Jacobsen, MD 09/01/13 [phone]

## 2013-08-08 NOTE — ED Notes (Signed)
Patient yelling and cursing in room.

## 2013-08-08 NOTE — ED Notes (Addendum)
This RN Got report from Hernandez pt is discharged and this RN must see pt.s ride to leave due to EtOH level. R   This RN explained situation to pt. Pt upset, cursing and screaming explicative about MD. RN apologetic but reinforcing policy- that patient must have ride. Pt. Walking out of room and states "well then y'all can arrest me" RN walked with patient to [facility] officer who explained and reinforced policy. Pt upset but walks back to room and sitting on bed without sheets RN offered sheets, food and drink pt denies and requesting narcotics. RN explained unable to give narcotics.     Patient refusing RN to take vital signs- pain 10/10

## 2013-08-08 NOTE — Discharge Instructions (Signed)
Chest Wall Pain Chest wall pain is pain in or around the bones and muscles of your chest. It may take up to 6 weeks to get better. It may take longer if you must stay physically active in your work and activities.  CAUSES  Chest wall pain may happen on its own. However, it may be caused by:  A viral illness like the flu.  Injury.  Coughing.  Exercise.  Arthritis.  Fibromyalgia.  Shingles. HOME CARE INSTRUCTIONS   Avoid overtiring physical activity. Try not to strain or perform activities that cause pain. This includes any activities using your chest or your abdominal and side muscles, especially if heavy weights are used.  Put ice on the sore area.  Put ice in a plastic bag.  Place a towel between your skin and the bag.  Leave the ice on for 15-20 minutes per hour while awake for the first 2 days.  Only take over-the-counter or prescription medicines for pain, discomfort, or fever as directed by your caregiver. SEEK IMMEDIATE MEDICAL CARE IF:   Your pain increases, or you are very uncomfortable.  You have a fever.  Your chest pain becomes worse.  You have new, unexplained symptoms.  You have nausea or vomiting.  You feel sweaty or lightheaded.  You have a cough with phlegm (sputum), or you cough up blood. MAKE SURE YOU:   Understand these instructions.  Will watch your condition.  Will get help right away if you are not doing well or get worse. Document Released: 04/25/2005 Document Revised: 07/18/2011 Document Reviewed: 12/20/2010 [employer] Patient Information 2014 [employer].  

## 2013-08-08 NOTE — ED Notes (Signed)
Pt reports EMS adm 2 nitroglycerin, pt reports no relief in chest pain. Pt reports he had 324 mg of aspirin at home today.

## 2013-08-08 NOTE — ED Notes (Signed)
Patient asked for pain meds. Dr Zenia Resides aware that patient wants pain meds. No new orders at this time.

## 2013-08-08 NOTE — ED Notes (Signed)
Patient ride came to ED RN handed off care to brother.

## 2013-08-13 ENCOUNTER — Emergency Department (HOSPITAL_COMMUNITY)
Admission: EM | Admit: 2013-08-13 | Discharge: 2013-08-13 | Disposition: A | Discharge status: Home / Self Care | Payer: Self-pay | Attending: Emergency Medicine | Admitting: Emergency Medicine

## 2013-08-13 ENCOUNTER — Encounter (HOSPITAL_COMMUNITY): Payer: Self-pay | Admitting: Emergency Medicine

## 2013-08-13 DIAGNOSIS — F411 Generalized anxiety disorder: Secondary | ICD-10-CM | POA: Insufficient documentation

## 2013-08-13 DIAGNOSIS — I10 Essential (primary) hypertension: Secondary | ICD-10-CM | POA: Insufficient documentation

## 2013-08-13 DIAGNOSIS — F17200 Nicotine dependence, unspecified, uncomplicated: Secondary | ICD-10-CM | POA: Insufficient documentation

## 2013-08-13 DIAGNOSIS — F329 Major depressive disorder, single episode, unspecified: Secondary | ICD-10-CM | POA: Insufficient documentation

## 2013-08-13 DIAGNOSIS — G43909 Migraine, unspecified, not intractable, without status migrainosus: Secondary | ICD-10-CM | POA: Insufficient documentation

## 2013-08-13 DIAGNOSIS — F3289 Other specified depressive episodes: Secondary | ICD-10-CM | POA: Insufficient documentation

## 2013-08-13 DIAGNOSIS — R011 Cardiac murmur, unspecified: Secondary | ICD-10-CM | POA: Insufficient documentation

## 2013-08-13 DIAGNOSIS — R000 Tachycardia, unspecified: Secondary | ICD-10-CM | POA: Insufficient documentation

## 2013-08-13 DIAGNOSIS — E871 Hypo-osmolality and hyponatremia: Secondary | ICD-10-CM | POA: Insufficient documentation

## 2013-08-13 DIAGNOSIS — IMO0002 Reserved for concepts with insufficient information to code with codable children: Secondary | ICD-10-CM

## 2013-08-13 DIAGNOSIS — J45909 Unspecified asthma, uncomplicated: Secondary | ICD-10-CM | POA: Insufficient documentation

## 2013-08-13 DIAGNOSIS — Z862 Personal history of diseases of the blood and blood-forming organs and certain disorders involving the immune mechanism: Secondary | ICD-10-CM | POA: Insufficient documentation

## 2013-08-13 DIAGNOSIS — K219 Gastro-esophageal reflux disease without esophagitis: Secondary | ICD-10-CM | POA: Insufficient documentation

## 2013-08-13 DIAGNOSIS — Z79899 Other long term (current) drug therapy: Secondary | ICD-10-CM | POA: Insufficient documentation

## 2013-08-13 DIAGNOSIS — M1710 Unilateral primary osteoarthritis, unspecified knee: Secondary | ICD-10-CM | POA: Insufficient documentation

## 2013-08-13 LAB — I-STAT CHEM 8, ED
BUN: 6 mg/dL (ref 6–23)
CALCIUM ION: 1.03 mmol/L — AB (ref 1.12–1.23)
CHLORIDE: 100 meq/L (ref 96–112)
CREATININE: 0.9 mg/dL (ref 0.50–1.35)
GLUCOSE: 125 mg/dL — AB (ref 70–99)
HCT: 35 % — ABNORMAL LOW (ref 39.0–52.0)
Hemoglobin: 11.9 g/dL — ABNORMAL LOW (ref 13.0–17.0)
POTASSIUM: 3.9 meq/L (ref 3.7–5.3)
Sodium: 130 mEq/L — ABNORMAL LOW (ref 137–147)
TCO2: 23 mmol/L (ref 0–100)

## 2013-08-13 LAB — BASIC METABOLIC PANEL
BUN: 8 mg/dL (ref 6–23)
CO2: 21 mEq/L (ref 19–32)
Calcium: 8.4 mg/dL (ref 8.4–10.5)
Chloride: 85 mEq/L — ABNORMAL LOW (ref 96–112)
Creatinine, Ser: 0.73 mg/dL (ref 0.50–1.35)
Glucose, Bld: 177 mg/dL — ABNORMAL HIGH (ref 70–99)
POTASSIUM: 4.7 meq/L (ref 3.7–5.3)
Sodium: 126 mEq/L — ABNORMAL LOW (ref 137–147)

## 2013-08-13 LAB — URINALYSIS, ROUTINE W REFLEX MICROSCOPIC
Bilirubin Urine: NEGATIVE
Glucose, UA: NEGATIVE mg/dL
Hgb urine dipstick: NEGATIVE
Ketones, ur: 15 mg/dL — AB
LEUKOCYTES UA: NEGATIVE
NITRITE: NEGATIVE
PH: 5.5 (ref 5.0–8.0)
Protein, ur: NEGATIVE mg/dL
Specific Gravity, Urine: 1.015 (ref 1.005–1.030)
UROBILINOGEN UA: 1 mg/dL (ref 0.0–1.0)

## 2013-08-13 LAB — CBC
HEMATOCRIT: 35.9 % — AB (ref 39.0–52.0)
HEMOGLOBIN: 12.8 g/dL — AB (ref 13.0–17.0)
MCH: 34.7 pg — ABNORMAL HIGH (ref 26.0–34.0)
MCHC: 35.7 g/dL (ref 30.0–36.0)
MCV: 97.3 fL (ref 78.0–100.0)
Platelets: 88 10*3/uL — ABNORMAL LOW (ref 150–400)
RBC: 3.69 MIL/uL — ABNORMAL LOW (ref 4.22–5.81)
RDW: 16.2 % — AB (ref 11.5–15.5)
WBC: 6 10*3/uL (ref 4.0–10.5)

## 2013-08-13 LAB — RAPID URINE DRUG SCREEN, HOSP PERFORMED
Amphetamines: NOT DETECTED
BENZODIAZEPINES: NOT DETECTED
Barbiturates: NOT DETECTED
COCAINE: NOT DETECTED
OPIATES: NOT DETECTED
Tetrahydrocannabinol: NOT DETECTED

## 2013-08-13 LAB — ETHANOL: Alcohol, Ethyl (B): <11 mg/dL (ref 0–11)

## 2013-08-13 MED ORDER — LORAZEPAM 1 MG PO TABS
1.0000 mg | ORAL_TABLET | Freq: Once | ORAL | Status: AC
  Administered 2013-08-13: 1 mg via ORAL
  Filled 2013-08-13: qty 1

## 2013-08-13 MED ORDER — LORAZEPAM 2 MG/ML IJ SOLN
1.0000 mg | Freq: Once | INTRAMUSCULAR | Status: AC
Start: 2013-08-13 — End: 2013-08-13
  Administered 2013-08-13: 1 mg via INTRAVENOUS
  Filled 2013-08-13: qty 1

## 2013-08-13 MED ORDER — METOCLOPRAMIDE HCL 5 MG/ML IJ SOLN
10.0000 mg | Freq: Once | INTRAMUSCULAR | Status: AC
  Administered 2013-08-13: 10 mg via INTRAMUSCULAR
  Filled 2013-08-13: qty 2

## 2013-08-13 MED ORDER — DIPHENHYDRAMINE HCL 25 MG PO CAPS
25.0000 mg | ORAL_CAPSULE | Freq: Once | ORAL | Status: AC
  Administered 2013-08-13: 25 mg via ORAL
  Filled 2013-08-13: qty 1

## 2013-08-13 MED ORDER — DIPHENHYDRAMINE HCL 50 MG/ML IJ SOLN
25.0000 mg | Freq: Once | INTRAMUSCULAR | Status: AC
  Administered 2013-08-13: 25 mg via INTRAVENOUS
  Filled 2013-08-13: qty 1

## 2013-08-13 MED ORDER — SODIUM CHLORIDE 0.9 % IV BOLUS (SEPSIS)
1000.0000 mL | Freq: Once | INTRAVENOUS | Status: AC
  Administered 2013-08-13: 1000 mL via INTRAVENOUS

## 2013-08-13 MED ORDER — DEXAMETHASONE SODIUM PHOSPHATE 10 MG/ML IJ SOLN
10.0000 mg | Freq: Once | INTRAMUSCULAR | Status: AC
  Administered 2013-08-13: 10 mg via INTRAMUSCULAR
  Filled 2013-08-13: qty 1

## 2013-08-13 MED ORDER — GI COCKTAIL ~~LOC~~
30.0000 mL | Freq: Once | ORAL | Status: AC
Start: 2013-08-13 — End: 2013-08-13
  Administered 2013-08-13: 30 mL via ORAL
  Filled 2013-08-13: qty 30

## 2013-08-13 NOTE — Discharge Instructions (Signed)
Stay well hydrated, follow up with your primary care doctor. It is important to take your blood pressure medications as prescribed. Do NOT stop taking your clonidine.  Migraine Headache A migraine headache is an intense, throbbing pain on one or both sides of your head. A migraine can last for 30 minutes to several hours. CAUSES  The exact cause of a migraine headache is not always known. However, a migraine may be caused when nerves in the brain become irritated and release chemicals that cause inflammation. This causes pain. Certain things may also trigger migraines, such as:  Alcohol.  Smoking.  Stress.  Menstruation.  Aged cheeses.  Foods or drinks that contain nitrates, glutamate, aspartame, or tyramine.  Lack of sleep.  Chocolate.  Caffeine.  Hunger.  Physical exertion.  Fatigue.  Medicines used to treat chest pain (nitroglycerine), birth control pills, estrogen, and some blood pressure medicines. SIGNS AND SYMPTOMS  Pain on one or both sides of your head.  Pulsating or throbbing pain.  Severe pain that prevents daily activities.  Pain that is aggravated by any physical activity.  Nausea, vomiting, or both.  Dizziness.  Pain with exposure to bright lights, loud noises, or activity.  General sensitivity to bright lights, loud noises, or smells. Before you get a migraine, you may get warning signs that a migraine is coming (aura). An aura may include:  Seeing flashing lights.  Seeing bright spots, halos, or zig-zag lines.  Having tunnel vision or blurred vision.  Having feelings of numbness or tingling.  Having trouble talking.  Having muscle weakness. DIAGNOSIS  A migraine headache is often diagnosed based on:  Symptoms.  Physical exam.  A CT scan or MRI of your head. These imaging tests cannot diagnose migraines, but they can help rule out other causes of headaches. TREATMENT Medicines may be given for pain and nausea. Medicines can also  be given to help prevent recurrent migraines.  HOME CARE INSTRUCTIONS  Only take over-the-counter or prescription medicines for pain or discomfort as directed by your health care provider. The use of long-term narcotics is not recommended.  Lie down in a dark, quiet room when you have a migraine.  Keep a journal to find out what may trigger your migraine headaches. For example, write down:  What you eat and drink.  How much sleep you get.  Any change to your diet or medicines.  Limit alcohol consumption.  Quit smoking if you smoke.  Get 7 9 hours of sleep, or as recommended by your health care provider.  Limit stress.  Keep lights dim if bright lights bother you and make your migraines worse. SEEK IMMEDIATE MEDICAL CARE IF:   Your migraine becomes severe.  You have a fever.  You have a stiff neck.  You have vision loss.  You have muscular weakness or loss of muscle control.  You start losing your balance or have trouble walking.  You feel faint or pass out.  You have severe symptoms that are different from your first symptoms. MAKE SURE YOU:   Understand these instructions.  Will watch your condition.  Will get help right away if you are not doing well or get worse. Document Released: 04/25/2005 Document Revised: 02/13/2013 Document Reviewed: 12/31/2012 [facility] Patient Information 2014 [place].  Hyponatremia  Hyponatremia is when the amount of salt (sodium) in your blood is too low. When sodium levels are low, your cells will absorb extra water and swell. The swelling happens throughout the body, but it mostly affects  the brain. Severe brain swelling (cerebral edema), seizures, or coma can happen.  CAUSES   Heart, kidney, or liver problems.  Thyroid problems.  Adrenal gland problems.  Severe vomiting and diarrhea.  Certain medicines or illegal drugs.  Dehydration.  Drinking too much water.  Low-sodium diet. SYMPTOMS   Nausea and  vomiting.  Confusion.  Lethargy.  Agitation.  Headache.  Twitching or shaking (seizures).  Unconsciousness.  Appetite loss.  Muscle weakness and cramping. DIAGNOSIS  Hyponatremia is identified by a simple blood test. Your caregiver will perform a history and physical exam to try to find the cause and type of hyponatremia. Other tests may be needed to measure the amount of sodium in your blood and urine. TREATMENT  Treatment will depend on the cause.   Fluids may be given through the vein (IV).  Medicines may be used to correct the sodium imbalance. If medicines are causing the problem, they will need to be adjusted.  Water or fluid intake may be restricted to restore proper balance. The speed of correcting the sodium problem is very important. If the problem is corrected too fast, nerve damage (sometimes unchangeable) can happen. HOME CARE INSTRUCTIONS   Only take medicines as directed by your caregiver. Many medicines can make hyponatremia worse. Discuss all your medicines with your caregiver.  Carefully follow any recommended diet, including any fluid restrictions.  You may be asked to repeat lab tests. Follow these directions.  Avoid alcohol and recreational drugs. SEEK MEDICAL CARE IF:   You develop worsening nausea, fatigue, headache, confusion, or weakness.  Your original hyponatremia symptoms return.  You have problems following the recommended diet. SEEK IMMEDIATE MEDICAL CARE IF:   You have a seizure.  You faint.  You have ongoing diarrhea or vomiting. MAKE SURE YOU:   Understand these instructions.  Will watch your condition.  Will get help right away if you are not doing well or get worse. Document Released: 04/15/2002 Document Revised: 07/18/2011 Document Reviewed: 10/10/2010 [facility] Patient Information 2014 [place], [place].  Hypertension Hypertension is another name for high blood pressure. High blood pressure may mean that your heart needs  to work harder to pump blood. Blood pressure consists of two numbers, which includes a higher number over a lower number (example: 110/72). HOME CARE   Make lifestyle changes as told by your doctor. This may include weight loss and exercise.  Take your blood pressure medicine every day.  Limit how much salt you use.  Stop smoking if you smoke.  Do not use drugs.  Talk to your doctor if you are using decongestants or birth control pills. These medicines might make blood pressure higher.  Females should not drink more than 1 alcoholic drink per day. Males should not drink more than 2 alcoholic drinks per day.  See your doctor as told. GET HELP RIGHT AWAY IF:   You have a blood pressure reading with a top number of 180 or higher.  You get a very bad headache.  You get blurred or changing vision.  You feel confused.  You feel weak, numb, or faint.  You get chest or belly (abdominal) pain.  You throw up (vomit).  You cannot breathe very well. MAKE SURE YOU:   Understand these instructions.  Will watch your condition.  Will get help right away if you are not doing well or get worse. Document Released: 10/12/2007 Document Revised: 07/18/2011 Document Reviewed: 10/12/2007 [facility] Patient Information 2014 [place], [place].

## 2013-08-13 NOTE — ED Provider Notes (Signed)
CSN: [id number]     Arrival date & time 08/13/13  1536 History   First MD Initiated Contact with Patient 08/13/13 1536     Chief Complaint  Patient presents with  . Headache     (Consider location/radiation/quality/duration/timing/severity/associated sxs/prior Treatment) HPI Comments: 43 year old male with a past medical history including HTN, asthma, pancreatitis, cocaine abuse, depression, anxiety, WPW, seizures and GERD presents to the emergency department via EMS complaining of gradual onset headache beginning earlier today that has been constant since, fluctuating in intensity from 9/10-6/10, described as throbbing, located in bilateral temporal region. He has tried taking ibuprofen without relief. States his headache feels exactly the same as his typical migraines. Admits to associated photophobia and phonophobia. Admits to nausea without vomiting. States he has not been taking his blood pressures for the past week because the clonidine is interfering with his sex life. Denies vision changes, chest pain, shortness of breath, neck pain or stiffness.  Patient is a 43 y.o. male presenting with headaches. The history is provided by the patient.  Headache Associated symptoms: nausea and photophobia     Past Medical History  Diagnosis Date  . Hypertension   . Asthma   . Pancreatitis   . Cocaine abuse   . Depression   . H/O suicide attempt   . Heart murmur     "when he was little" (03/06/2013)  . Shortness of breath     "can happen at anytime" (03/06/2013)  . Anemia   . H/O hiatal hernia   . GERD (gastroesophageal reflux disease)   . Seizures     "weekly" (03/06/2013)  . Arthritis     "knees" (03/06/2013)  . Anxiety   . WPW (Wolff-Parkinson-White syndrome)     [name] 03/06/2013   Past Surgical History  Procedure Laterality Date  . Facial fracture surgery Left 1990's    "result of trauma" (03/06/2013)  . Eye surgery Left 1990's    "result of trauma" (03/06/2013)   Family  History  Problem Relation Age of Onset  . Hypertension Other   . Coronary artery disease Other    History  Substance Use Topics  . Smoking status: Current Every Day Smoker -- 1.00 packs/day for 30 years    Types: Cigarettes  . Smokeless tobacco: Current User  . Alcohol Use: 25.2 oz/week    2 Shots of liquor, 40 Cans of beer per week     Comment: 03/06/2013 "1, 40oz/day; plus couple shots liquor once/wk"    Review of Systems  Eyes: Positive for photophobia.  Gastrointestinal: Positive for nausea.  Neurological: Positive for headaches.  All other systems reviewed and are negative.      Allergies  Trazodone and nefazodone and Shellfish-derived products  Home Medications   Current Outpatient Rx  Name  Route  Sig  Dispense  Refill  . albuterol (PROVENTIL HFA;VENTOLIN HFA) 108 (90 BASE) MCG/ACT inhaler   Inhalation   Inhale 1-2 puffs into the lungs every 6 (six) hours as needed for wheezing or shortness of breath.         [name] HYDROcodone-acetaminophen (NORCO/VICODIN) 5-325 MG per tablet   Oral   Take 1 tablet by mouth every 4 (four) hours as needed.   6 tablet   0   . ibuprofen (ADVIL,MOTRIN) 200 MG tablet   Oral   Take 800 mg by mouth every 8 (eight) hours as needed for moderate pain.         . methocarbamol (ROBAXIN-750) 750 MG tablet   Oral  Take 1 tablet (750 mg total) by mouth 4 (four) times daily.   30 tablet   0   . Multiple Vitamins-Minerals (MULTIVITAMIN PO)   Oral   Take 1 tablet by mouth daily.         [name] omeprazole (PRILOSEC) 20 MG capsule   Oral   Take 20 mg by mouth 2 (two) times daily before a meal.         . QUEtiapine Fumarate (SEROQUEL XR) 150 MG 24 hr tablet   Oral   Take 150 mg by mouth at bedtime.          BP 160/88  Pulse 126  Temp(Src) 98.5 F (36.9 C) (Oral)  Resp 18  SpO2 99% Physical Exam  Nursing note and vitals reviewed. Constitutional: He is oriented to person, place, and time. He appears well-developed and  well-nourished. No distress.  HENT:  Head: Normocephalic and atraumatic.  Mouth/Throat: Oropharynx is clear and moist.  Eyes: Conjunctivae and EOM are normal. Pupils are equal, round, and reactive to light.  Neck: Normal range of motion. Neck supple.  No meningismus.  Cardiovascular: Regular rhythm, normal heart sounds and intact distal pulses.  Tachycardia present.   Pulmonary/Chest: Effort normal and breath sounds normal. No respiratory distress.  Abdominal: Soft. Bowel sounds are normal. There is no tenderness.  Musculoskeletal: Normal range of motion. He exhibits no edema.  Neurological: He is alert and oriented to person, place, and time. He has normal strength. No cranial nerve deficit or sensory deficit. Coordination normal.  Speech fluent, goal oriented. Moves limbs without ataxia. Equal grip strength bilateral.  Skin: Skin is warm and dry. He is not diaphoretic.  Psychiatric: His behavior is normal. His mood appears anxious.    ED Course  Procedures (including critical care time) Labs Review Labs Reviewed  CBC - Abnormal; Notable for the following:    RBC 3.69 (*)    Hemoglobin 12.8 (*)    HCT 35.9 (*)    MCH 34.7 (*)    RDW 16.2 (*)    Platelets 88 (*)    All other components within normal limits  BASIC METABOLIC PANEL - Abnormal; Notable for the following:    Sodium 126 (*)    Chloride 85 (*)    Glucose, Bld 177 (*)    All other components within normal limits  URINALYSIS, ROUTINE W REFLEX MICROSCOPIC - Abnormal; Notable for the following:    Color, Urine AMBER (*)    Ketones, ur 15 (*)    All other components within normal limits  I-STAT CHEM 8, ED - Abnormal; Notable for the following:    Sodium 130 (*)    Glucose, Bld 125 (*)    Calcium, Ion 1.03 (*)    Hemoglobin 11.9 (*)    HCT 35.0 (*)    All other components within normal limits  URINE RAPID DRUG SCREEN (HOSP PERFORMED)  ETHANOL   Imaging Review No results found.   EKG Interpretation None       MDM   Final diagnoses:  Migraine headache  Hyponatremia  Hypertension   Pt presenting with HTN and headache the same as his typical migraines. He is well appearing and in NAD, hypertensive 200/100 on arrival, decreased to 158/80 without intervention just after laying on exam bed a few minutes. Has not taken Clonidine x 1 week, states he has enough pills at home. I discussed importance of medication compliance, and if he has a problem with clonidine sexually he should consult his  PCP. No red flags concerning patient's headache, afebrile, no meningeal signs, no focal neurologic deficits. Plan to treat headache with ativan, reglan, benadryl and decadron. Case discussed with attending Dr. Zenia Resides who agrees with plan of care. 5:13 PM Pt reports his headache is starting to slowly resolve, however states his feet are cramping, seems very anxious. He is tachycardic ~130. Will check labs, ua, UDS, EKG, give IV fluids, ativan and another dose of benadryl. Hx of substance abuse, possible withdrawal. Dr. Zenia Resides aware, agreeable. 7:27 PM Initial BMP showed hyponatremia of 126. After 0.5 L fluid bolus Na improved to 130, he received more IV fluids after recheck. HR decreased to 103 with fluids and ativan. He reports his headache has subsided. He is stable for discharge, discussed again the importance of compliance with hypertension medications. Followup with primary care physician. Return precautions given. Patient states understanding of treatment care plan and is agreeable. Discussed with attending Dr. Zenia Resides who agrees with plan of care.   Illene Labrador, PA-C 08/13/13 2004

## 2013-08-13 NOTE — ED Notes (Signed)
H/a that started today lightsensitive has been out of bp meds x 1 week states feelslike his migraine

## 2013-08-13 NOTE — ED Notes (Signed)
Pt reports drinks ETOH daily and last drink was last nite.  Pt denies having seizures if he stops drinking.  Will continue to monitor.

## 2013-08-15 NOTE — ED Provider Notes (Signed)
Medical screening examination/treatment/procedure(s) were performed by non-physician practitioner and as supervising physician I was immediately available for consultation/collaboration.   Leota Jacobsen, MD 08/15/13 [id number]

## 2013-08-16 ENCOUNTER — Emergency Department (HOSPITAL_COMMUNITY): Payer: Self-pay

## 2013-08-16 ENCOUNTER — Encounter (HOSPITAL_COMMUNITY): Payer: Self-pay | Admitting: Emergency Medicine

## 2013-08-16 ENCOUNTER — Emergency Department (HOSPITAL_COMMUNITY)
Admission: EM | Admit: 2013-08-16 | Discharge: 2013-08-16 | Disposition: A | Discharge status: Home / Self Care | Payer: Self-pay | Attending: Emergency Medicine | Admitting: Emergency Medicine

## 2013-08-16 DIAGNOSIS — Z8669 Personal history of other diseases of the nervous system and sense organs: Secondary | ICD-10-CM | POA: Insufficient documentation

## 2013-08-16 DIAGNOSIS — Z79899 Other long term (current) drug therapy: Secondary | ICD-10-CM | POA: Insufficient documentation

## 2013-08-16 DIAGNOSIS — S20219A Contusion of unspecified front wall of thorax, initial encounter: Secondary | ICD-10-CM | POA: Insufficient documentation

## 2013-08-16 DIAGNOSIS — IMO0002 Reserved for concepts with insufficient information to code with codable children: Secondary | ICD-10-CM

## 2013-08-16 DIAGNOSIS — M1710 Unilateral primary osteoarthritis, unspecified knee: Secondary | ICD-10-CM | POA: Insufficient documentation

## 2013-08-16 DIAGNOSIS — F329 Major depressive disorder, single episode, unspecified: Secondary | ICD-10-CM | POA: Insufficient documentation

## 2013-08-16 DIAGNOSIS — Z862 Personal history of diseases of the blood and blood-forming organs and certain disorders involving the immune mechanism: Secondary | ICD-10-CM | POA: Insufficient documentation

## 2013-08-16 DIAGNOSIS — F411 Generalized anxiety disorder: Secondary | ICD-10-CM | POA: Insufficient documentation

## 2013-08-16 DIAGNOSIS — I10 Essential (primary) hypertension: Secondary | ICD-10-CM | POA: Insufficient documentation

## 2013-08-16 DIAGNOSIS — K219 Gastro-esophageal reflux disease without esophagitis: Secondary | ICD-10-CM | POA: Insufficient documentation

## 2013-08-16 DIAGNOSIS — F17200 Nicotine dependence, unspecified, uncomplicated: Secondary | ICD-10-CM | POA: Insufficient documentation

## 2013-08-16 DIAGNOSIS — R011 Cardiac murmur, unspecified: Secondary | ICD-10-CM | POA: Insufficient documentation

## 2013-08-16 DIAGNOSIS — F3289 Other specified depressive episodes: Secondary | ICD-10-CM | POA: Insufficient documentation

## 2013-08-16 DIAGNOSIS — J45909 Unspecified asthma, uncomplicated: Secondary | ICD-10-CM | POA: Insufficient documentation

## 2013-08-16 MED ORDER — IBUPROFEN 800 MG PO TABS
800.0000 mg | ORAL_TABLET | Freq: Once | ORAL | Status: AC
  Administered 2013-08-16: 800 mg via ORAL
  Filled 2013-08-16: qty 1

## 2013-08-16 MED ORDER — OXYCODONE HCL 5 MG PO TABS
5.0000 mg | ORAL_TABLET | Freq: Once | ORAL | Status: AC
  Administered 2013-08-16: 5 mg via ORAL
  Filled 2013-08-16: qty 1

## 2013-08-16 MED ORDER — IBUPROFEN 800 MG PO TABS: 800.0000 mg | ORAL_TABLET | Freq: Three times a day (TID) | ORAL | Status: DC

## 2013-08-16 MED ORDER — HYDROCODONE-ACETAMINOPHEN 5-325 MG PO TABS: ORAL_TABLET | ORAL | Status: DC

## 2013-08-16 NOTE — ED Notes (Signed)
Patient transported to X-ray 

## 2013-08-16 NOTE — Discharge Instructions (Signed)
Please read and follow all provided instructions.  Your diagnoses today include:  1. Rib contusion     Tests performed today include:  Chest x-ray - no definite broken ribs or lung injury  Vital signs. See below for your results today.   Medications prescribed:   Vicodin (hydrocodone/acetaminophen) - narcotic pain medication  DO NOT drive or perform any activities that require you to be awake and alert because this medicine can make you drowsy. BE VERY CAREFUL not to take multiple medicines containing Tylenol (also called acetaminophen). Doing so can lead to an overdose which can damage your liver and cause liver failure and possibly death.   Ibuprofen (Motrin, Advil) - anti-inflammatory pain medication  Do not exceed 600mg  ibuprofen every 6 hours, take with food  You have been prescribed an anti-inflammatory medication or NSAID. Take with food. Take smallest effective dose for the shortest duration needed for your pain. Stop taking if you experience stomach pain or vomiting.   Take any prescribed medications only as directed.  Home care instructions:  Follow any educational materials contained in this packet.  As we discussed, take 10 deep breaths every hour while awake. This helps to expand your lungs and prevent infections like pneumonia. This is especially important while wearing any compression over your ribs.   BE VERY CAREFUL not to take multiple medicines containing Tylenol (also called acetaminophen). Doing so can lead to an overdose which can damage your liver and cause liver failure and possibly death.   Follow-up instructions: Please follow-up with your primary care provider in the next 3 days for further evaluation of your symptoms. If you do not have a primary care doctor -- see below for referral information.   Return instructions:   Please return to the Emergency Department if you experience worsening symptoms.   Please return if you have any other emergent  concerns.  Additional Information:  Your vital signs today were: BP 132/91   Pulse 96   Temp(Src) 98.9 F (37.2 C) (Oral)   Resp 18   Ht 5\' 9"  (1.753 m)   Wt 147 lb (66.679 kg)   BMI 21.70 kg/m2   SpO2 99% If your blood pressure (BP) was elevated above 135/85 this visit, please have this repeated by your doctor within one month. --------------

## 2013-08-16 NOTE — ED Provider Notes (Signed)
Medical screening examination/treatment/procedure(s) were performed by non-physician practitioner and as supervising physician I was immediately available for consultation/collaboration.   EKG Interpretation None        Blanchie Dessert, MD 08/16/13 1319
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
## 2013-09-02 IMAGING — CR DG CHEST 2V
3 series · 3 of 3 positions shown · non-contrast
Comparison: 08/18/2010

CLINICAL DATA: Mid chest pain, shortness of breath.

CHEST - 2 VIEW

[w chest pa]
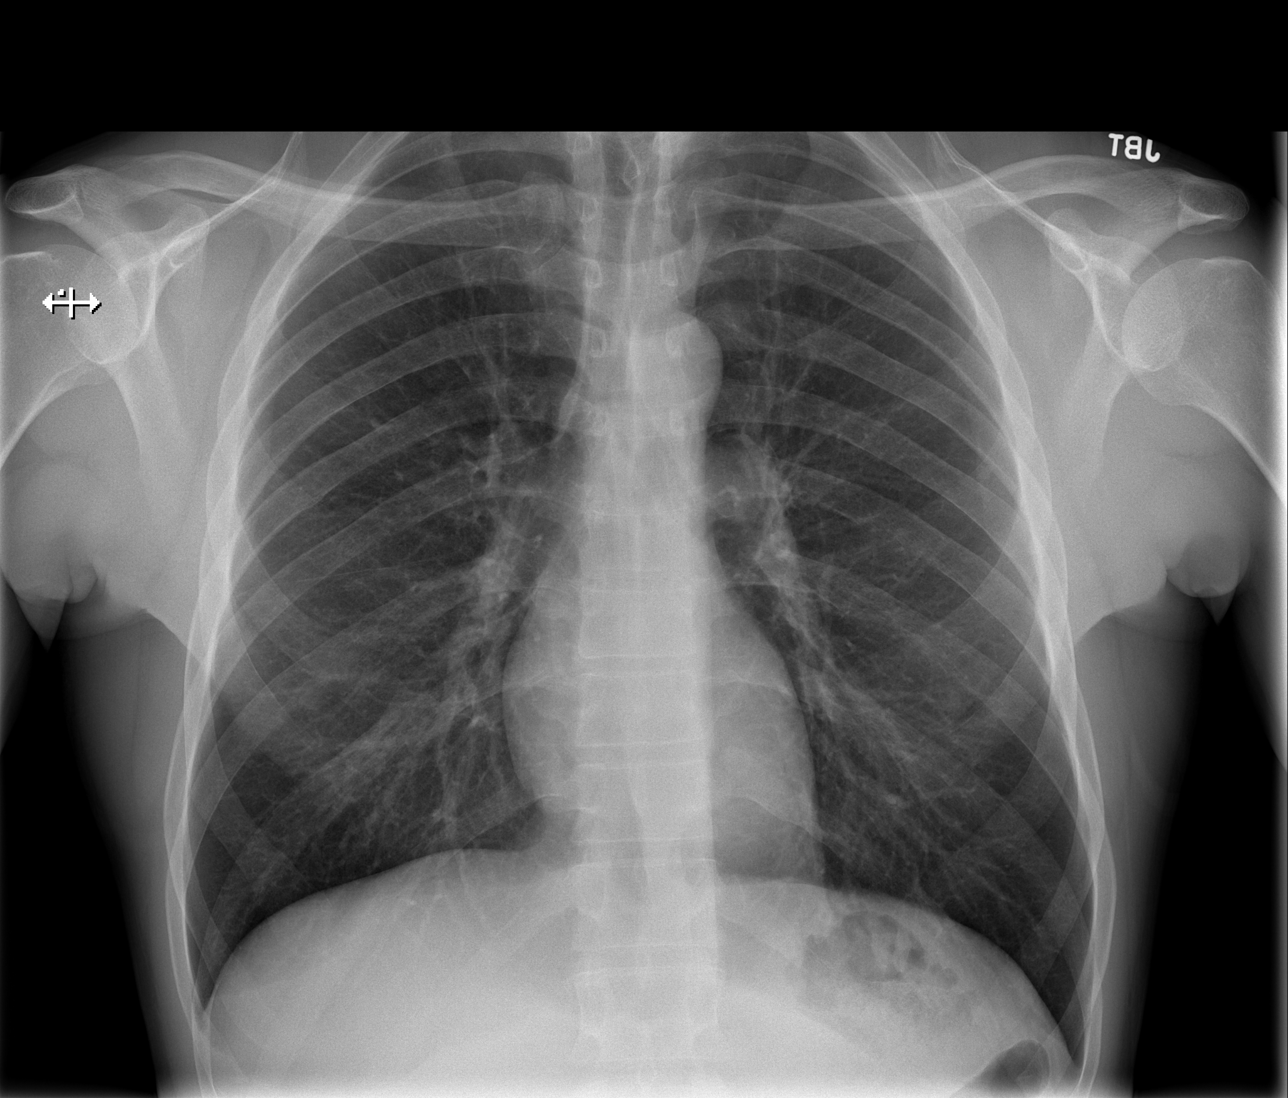

[w chest lat]
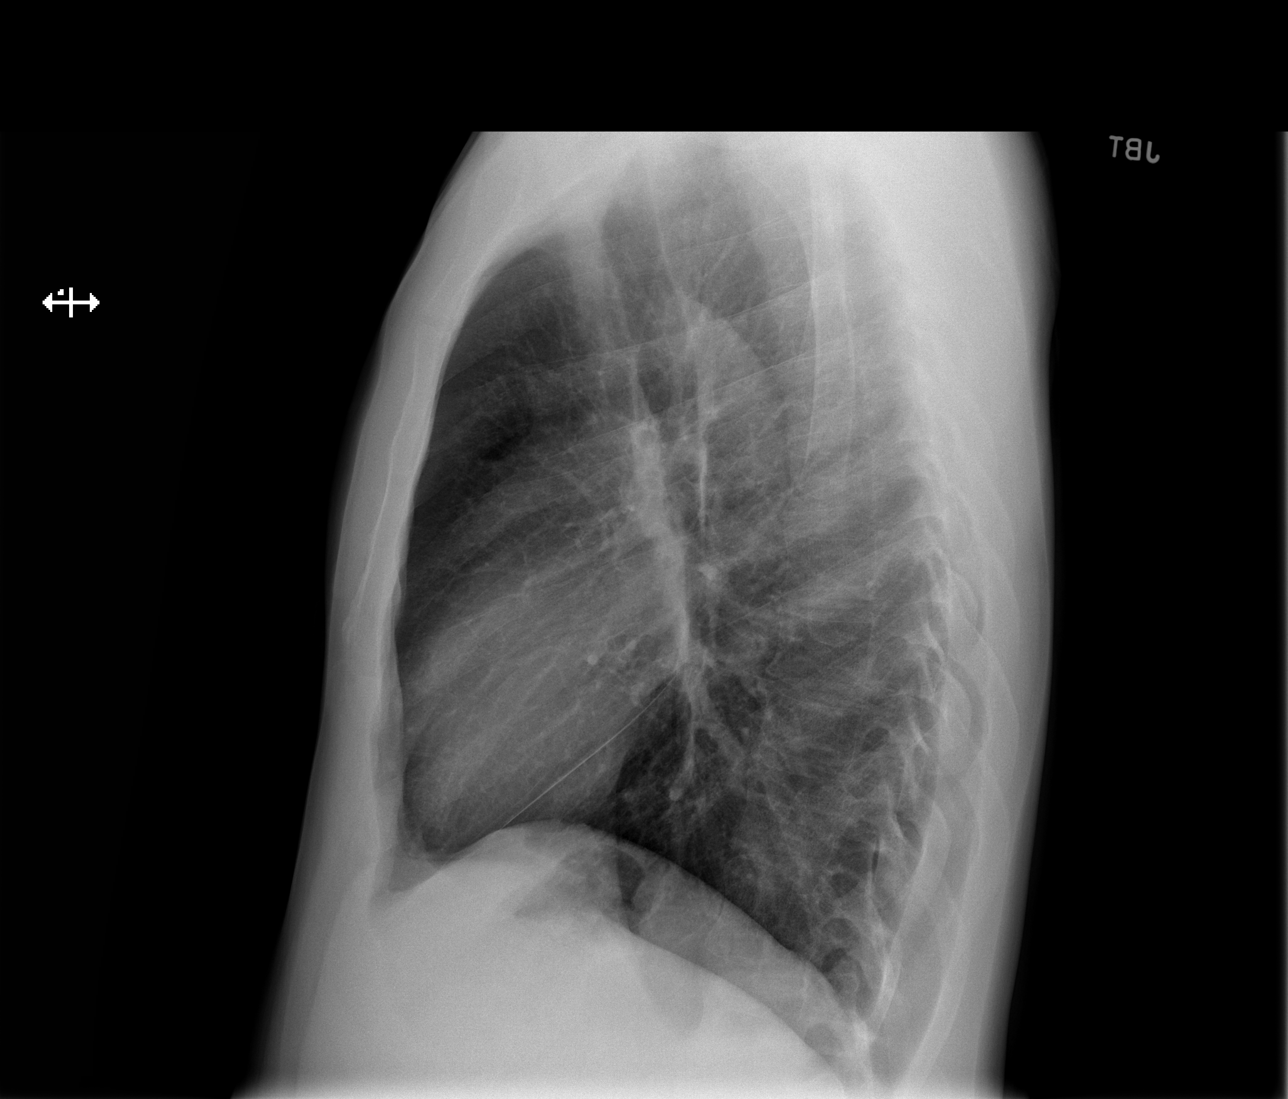

[w chest decub]
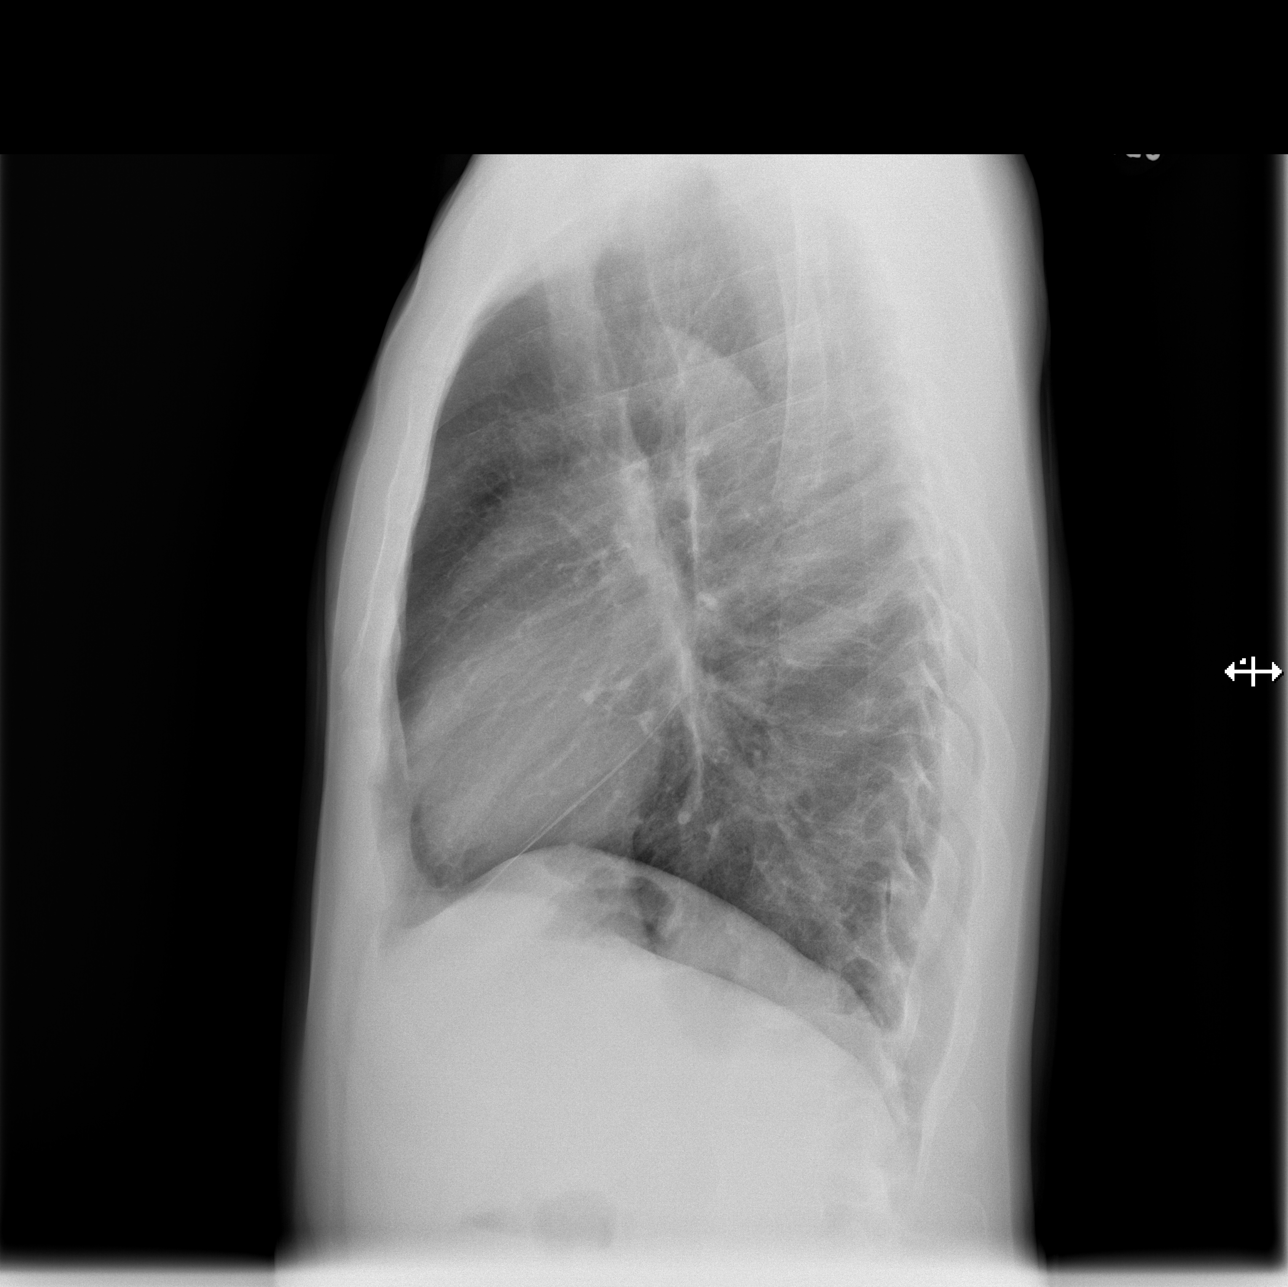

[3 of 3 positions shown; findings below may reference images not displayed]

FINDINGS: Heart and mediastinal contours are within normal limits.
No focal opacities or effusions.  No acute bony abnormality.
IMPRESSION: Normal study.

## 2013-09-13 ENCOUNTER — Encounter (HOSPITAL_COMMUNITY): Payer: Self-pay | Admitting: Emergency Medicine

## 2013-09-13 ENCOUNTER — Emergency Department (HOSPITAL_COMMUNITY)
Admission: EM | Admit: 2013-09-13 | Discharge: 2013-09-13 | Disposition: A | Discharge status: Home / Self Care | Payer: Self-pay | Attending: Emergency Medicine | Admitting: Emergency Medicine

## 2013-09-13 ENCOUNTER — Emergency Department (HOSPITAL_COMMUNITY): Payer: Self-pay

## 2013-09-13 DIAGNOSIS — K219 Gastro-esophageal reflux disease without esophagitis: Secondary | ICD-10-CM | POA: Insufficient documentation

## 2013-09-13 DIAGNOSIS — F3289 Other specified depressive episodes: Secondary | ICD-10-CM | POA: Insufficient documentation

## 2013-09-13 DIAGNOSIS — T71191A Asphyxiation due to mechanical threat to breathing due to other causes, accidental, initial encounter: Secondary | ICD-10-CM | POA: Insufficient documentation

## 2013-09-13 DIAGNOSIS — S0083XA Contusion of other part of head, initial encounter: Principal | ICD-10-CM | POA: Insufficient documentation

## 2013-09-13 DIAGNOSIS — S0093XA Contusion of unspecified part of head, initial encounter: Secondary | ICD-10-CM

## 2013-09-13 DIAGNOSIS — J45909 Unspecified asthma, uncomplicated: Secondary | ICD-10-CM | POA: Insufficient documentation

## 2013-09-13 DIAGNOSIS — F17200 Nicotine dependence, unspecified, uncomplicated: Secondary | ICD-10-CM | POA: Insufficient documentation

## 2013-09-13 DIAGNOSIS — F329 Major depressive disorder, single episode, unspecified: Secondary | ICD-10-CM | POA: Insufficient documentation

## 2013-09-13 DIAGNOSIS — Z79899 Other long term (current) drug therapy: Secondary | ICD-10-CM | POA: Insufficient documentation

## 2013-09-13 DIAGNOSIS — Z862 Personal history of diseases of the blood and blood-forming organs and certain disorders involving the immune mechanism: Secondary | ICD-10-CM | POA: Insufficient documentation

## 2013-09-13 DIAGNOSIS — S1093XA Contusion of unspecified part of neck, initial encounter: Principal | ICD-10-CM

## 2013-09-13 DIAGNOSIS — S0003XA Contusion of scalp, initial encounter: Secondary | ICD-10-CM | POA: Insufficient documentation

## 2013-09-13 DIAGNOSIS — M129 Arthropathy, unspecified: Secondary | ICD-10-CM | POA: Insufficient documentation

## 2013-09-13 DIAGNOSIS — I10 Essential (primary) hypertension: Secondary | ICD-10-CM | POA: Insufficient documentation

## 2013-09-13 DIAGNOSIS — IMO0001 Reserved for inherently not codable concepts without codable children: Secondary | ICD-10-CM

## 2013-09-13 DIAGNOSIS — R011 Cardiac murmur, unspecified: Secondary | ICD-10-CM | POA: Insufficient documentation

## 2013-09-13 DIAGNOSIS — G40909 Epilepsy, unspecified, not intractable, without status epilepticus: Secondary | ICD-10-CM | POA: Insufficient documentation

## 2013-09-13 DIAGNOSIS — F411 Generalized anxiety disorder: Secondary | ICD-10-CM | POA: Insufficient documentation

## 2013-09-13 DIAGNOSIS — M542 Cervicalgia: Secondary | ICD-10-CM

## 2013-09-13 LAB — I-STAT CHEM 8, ED
BUN: 15 mg/dL (ref 6–23)
CALCIUM ION: 1.13 mmol/L (ref 1.12–1.23)
CREATININE: 1.2 mg/dL (ref 0.50–1.35)
Chloride: 106 mEq/L (ref 96–112)
GLUCOSE: 95 mg/dL (ref 70–99)
HCT: 40 % (ref 39.0–52.0)
Hemoglobin: 13.6 g/dL (ref 13.0–17.0)
Potassium: 4.8 mEq/L (ref 3.7–5.3)
Sodium: 140 mEq/L (ref 137–147)
TCO2: 24 mmol/L (ref 0–100)

## 2013-09-13 MED ORDER — KETOROLAC TROMETHAMINE 60 MG/2ML IM SOLN
60.0000 mg | Freq: Once | INTRAMUSCULAR | Status: AC
  Administered 2013-09-13: 60 mg via INTRAMUSCULAR
  Filled 2013-09-13: qty 2

## 2013-09-13 MED ORDER — FENTANYL CITRATE 0.05 MG/ML IJ SOLN
50.0000 ug | Freq: Once | INTRAMUSCULAR | Status: AC
  Administered 2013-09-13: 50 ug via INTRAVENOUS
  Filled 2013-09-13: qty 2

## 2013-09-13 MED ORDER — CYCLOBENZAPRINE HCL 10 MG PO TABS: 10.0000 mg | ORAL_TABLET | Freq: Three times a day (TID) | ORAL | Status: DC | PRN

## 2013-09-13 MED ORDER — DIPHENHYDRAMINE HCL 50 MG/ML IJ SOLN
25.0000 mg | Freq: Once | INTRAMUSCULAR | Status: AC
  Administered 2013-09-13: 25 mg via INTRAVENOUS
  Filled 2013-09-13: qty 1

## 2013-09-13 MED ORDER — NAPROXEN 375 MG PO TABS: 375.0000 mg | ORAL_TABLET | Freq: Two times a day (BID) | ORAL | Status: DC

## 2013-09-13 MED ORDER — METOCLOPRAMIDE HCL 5 MG/ML IJ SOLN
10.0000 mg | Freq: Once | INTRAMUSCULAR | Status: AC
  Administered 2013-09-13: 10 mg via INTRAVENOUS
  Filled 2013-09-13: qty 2

## 2013-09-13 MED ORDER — IOHEXOL 300 MG/ML  SOLN
75.0000 mL | Freq: Once | INTRAMUSCULAR | Status: AC | PRN
  Administered 2013-09-13: 75 mL via INTRAVENOUS

## 2013-09-13 MED ORDER — SODIUM CHLORIDE 0.9 % IV SOLN
INTRAVENOUS | Status: DC
  Administered 2013-09-13: 13:00:00 via INTRAVENOUS

## 2013-09-13 MED ORDER — KETOROLAC TROMETHAMINE 30 MG/ML IJ SOLN: 30.0000 mg | Freq: Once | INTRAMUSCULAR | Status: DC

## 2013-09-13 NOTE — Discharge Instructions (Signed)
Ice packs to the injured areas. Take the medications as prescribed. Return to the ED for any problems listed on the head injury sheet, or if you are unable to swallow, have difficulty breathing, or have a change in your voice.
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
## 2013-09-17 NOTE — ED Notes (Signed)
[facility] did not get to see patient but will be sending information about [facility], using the address provided.  °

## 2013-09-18 ENCOUNTER — Emergency Department (HOSPITAL_COMMUNITY)
Admission: EM | Admit: 2013-09-18 | Discharge: 2013-09-18 | Disposition: A | Discharge status: Home / Self Care | Payer: Self-pay | Attending: Emergency Medicine | Admitting: Emergency Medicine

## 2013-09-18 DIAGNOSIS — I10 Essential (primary) hypertension: Secondary | ICD-10-CM | POA: Insufficient documentation

## 2013-09-18 DIAGNOSIS — F17200 Nicotine dependence, unspecified, uncomplicated: Secondary | ICD-10-CM | POA: Insufficient documentation

## 2013-09-18 DIAGNOSIS — F329 Major depressive disorder, single episode, unspecified: Secondary | ICD-10-CM | POA: Insufficient documentation

## 2013-09-18 DIAGNOSIS — K219 Gastro-esophageal reflux disease without esophagitis: Secondary | ICD-10-CM | POA: Insufficient documentation

## 2013-09-18 DIAGNOSIS — Z862 Personal history of diseases of the blood and blood-forming organs and certain disorders involving the immune mechanism: Secondary | ICD-10-CM | POA: Insufficient documentation

## 2013-09-18 DIAGNOSIS — Z791 Long term (current) use of non-steroidal anti-inflammatories (NSAID): Secondary | ICD-10-CM | POA: Insufficient documentation

## 2013-09-18 DIAGNOSIS — F1010 Alcohol abuse, uncomplicated: Secondary | ICD-10-CM

## 2013-09-18 DIAGNOSIS — M129 Arthropathy, unspecified: Secondary | ICD-10-CM | POA: Insufficient documentation

## 2013-09-18 DIAGNOSIS — K859 Acute pancreatitis without necrosis or infection, unspecified: Secondary | ICD-10-CM

## 2013-09-18 DIAGNOSIS — F411 Generalized anxiety disorder: Secondary | ICD-10-CM | POA: Insufficient documentation

## 2013-09-18 DIAGNOSIS — R011 Cardiac murmur, unspecified: Secondary | ICD-10-CM | POA: Insufficient documentation

## 2013-09-18 DIAGNOSIS — Z79899 Other long term (current) drug therapy: Secondary | ICD-10-CM | POA: Insufficient documentation

## 2013-09-18 DIAGNOSIS — J45909 Unspecified asthma, uncomplicated: Secondary | ICD-10-CM | POA: Insufficient documentation

## 2013-09-18 DIAGNOSIS — F3289 Other specified depressive episodes: Secondary | ICD-10-CM | POA: Insufficient documentation

## 2013-09-18 LAB — COMPREHENSIVE METABOLIC PANEL
ALBUMIN: 2.7 g/dL — AB (ref 3.5–5.2)
ALK PHOS: 307 U/L — AB (ref 39–117)
ALT: 278 U/L — ABNORMAL HIGH (ref 0–53)
AST: 523 U/L — ABNORMAL HIGH (ref 0–37)
BILIRUBIN TOTAL: 3 mg/dL — AB (ref 0.3–1.2)
BUN: 10 mg/dL (ref 6–23)
CHLORIDE: 100 meq/L (ref 96–112)
CO2: 18 mEq/L — ABNORMAL LOW (ref 19–32)
Calcium: 8.7 mg/dL (ref 8.4–10.5)
Creatinine, Ser: 0.79 mg/dL (ref 0.50–1.35)
GFR calc Af Amer: >90 mL/min (ref >90)
Glucose, Bld: 135 mg/dL — ABNORMAL HIGH (ref 70–99)
POTASSIUM: 4.7 meq/L (ref 3.7–5.3)
Sodium: 134 mEq/L — ABNORMAL LOW (ref 137–147)
Total Protein: 6.2 g/dL (ref 6.0–8.3)

## 2013-09-18 LAB — CBC WITH DIFFERENTIAL/PLATELET
Basophils Absolute: 0 10*3/uL (ref 0.0–0.1)
Basophils Relative: 0 % (ref 0–1)
Eosinophils Absolute: 0 10*3/uL (ref 0.0–0.7)
Eosinophils Relative: 0 % (ref 0–5)
HCT: 32 % — ABNORMAL LOW (ref 39.0–52.0)
HEMOGLOBIN: 11.2 g/dL — AB (ref 13.0–17.0)
LYMPHS ABS: 1.8 10*3/uL (ref 0.7–4.0)
Lymphocytes Relative: 22 % (ref 12–46)
MCH: 34.8 pg — ABNORMAL HIGH (ref 26.0–34.0)
MCHC: 35 g/dL (ref 30.0–36.0)
MCV: 99.4 fL (ref 78.0–100.0)
Monocytes Absolute: 0.6 10*3/uL (ref 0.1–1.0)
Monocytes Relative: 7 % (ref 3–12)
NEUTROS ABS: 5.8 10*3/uL (ref 1.7–7.7)
NEUTROS PCT: 70 % (ref 43–77)
Platelets: 81 10*3/uL — ABNORMAL LOW (ref 150–400)
RBC: 3.22 MIL/uL — ABNORMAL LOW (ref 4.22–5.81)
RDW: 16.6 % — ABNORMAL HIGH (ref 11.5–15.5)
WBC: 8.2 10*3/uL (ref 4.0–10.5)

## 2013-09-18 LAB — URINALYSIS, ROUTINE W REFLEX MICROSCOPIC
GLUCOSE, UA: NEGATIVE mg/dL
Hgb urine dipstick: NEGATIVE
KETONES UR: NEGATIVE mg/dL
LEUKOCYTES UA: NEGATIVE
Nitrite: NEGATIVE
PH: 5 (ref 5.0–8.0)
PROTEIN: NEGATIVE mg/dL
Specific Gravity, Urine: 1.021 (ref 1.005–1.030)
Urobilinogen, UA: 0.2 mg/dL (ref 0.0–1.0)

## 2013-09-18 LAB — ETHANOL: Alcohol, Ethyl (B): 90 mg/dL — ABNORMAL HIGH (ref 0–11)

## 2013-09-18 LAB — LIPASE, BLOOD: LIPASE: 237 U/L — AB (ref 11–59)

## 2013-09-18 MED ORDER — OXYCODONE-ACETAMINOPHEN 5-325 MG PO TABS: 1.0000 | ORAL_TABLET | Freq: Four times a day (QID) | ORAL | Status: DC | PRN

## 2013-09-18 MED ORDER — SODIUM CHLORIDE 0.9 % IV BOLUS (SEPSIS)
1000.0000 mL | Freq: Once | INTRAVENOUS | Status: AC
  Administered 2013-09-18: 1000 mL via INTRAVENOUS

## 2013-09-18 MED ORDER — ONDANSETRON HCL 4 MG/2ML IJ SOLN
4.0000 mg | Freq: Once | INTRAMUSCULAR | Status: AC
  Administered 2013-09-18: 4 mg via INTRAVENOUS
  Filled 2013-09-18: qty 2

## 2013-09-18 MED ORDER — MORPHINE SULFATE 4 MG/ML IJ SOLN
4.0000 mg | Freq: Once | INTRAMUSCULAR | Status: AC
  Administered 2013-09-18: 4 mg via INTRAVENOUS
  Filled 2013-09-18: qty 1

## 2013-09-18 MED ORDER — SUCRALFATE 1 G PO TABS
1.0000 g | ORAL_TABLET | Freq: Once | ORAL | Status: AC
  Administered 2013-09-18: 1 g via ORAL
  Filled 2013-09-18 (×2): qty 1

## 2013-09-18 MED ORDER — ONDANSETRON 4 MG PO TBDP: 4.0000 mg | ORAL_TABLET | Freq: Three times a day (TID) | ORAL | Status: DC | PRN

## 2013-09-18 MED ORDER — OXYCODONE-ACETAMINOPHEN 5-325 MG PO TABS
1.0000 | ORAL_TABLET | Freq: Once | ORAL | Status: AC
  Administered 2013-09-18: 1 via ORAL
  Filled 2013-09-18: qty 1

## 2013-09-18 NOTE — ED Notes (Signed)
Patient given ginerale

## 2013-09-18 NOTE — Discharge Instructions (Signed)
Alcohol Problems °Most adults who drink alcohol drink in moderation (not a lot) are at low risk for developing problems related to their drinking. However, all drinkers, including low-risk drinkers, should know about the health risks connected with drinking alcohol. °RECOMMENDATIONS FOR LOW-RISK DRINKING  °Drink in moderation. Moderate drinking is defined as follows:  °· Men - no more than 2 drinks per day. °· Nonpregnant women - no more than 1 drink per day. °· Over age 65 - no more than 1 drink per day. °A standard drink is 12 grams of pure alcohol, which is equal to a 12 ounce bottle of beer or wine cooler, a 5 ounce glass of wine, or 1.5 ounces of distilled spirits (such as whiskey, brandy, vodka, or rum).  °ABSTAIN FROM (DO NOT DRINK) ALCOHOL: °· When pregnant or considering pregnancy. °· When taking a medication that interacts with alcohol. °· If you are alcohol dependent. °· A medical condition that prohibits drinking alcohol (such as ulcer, liver disease, or heart disease). °DISCUSS WITH YOUR CAREGIVER: °· If you are at risk for coronary heart disease, discuss the potential benefits and risks of alcohol use: Light to moderate drinking is associated with lower rates of coronary heart disease in certain populations (for example, men over age 45 and postmenopausal women). Infrequent or nondrinkers are advised not to begin light to moderate drinking to reduce the risk of coronary heart disease so as to avoid creating an alcohol-related problem. Similar protective effects can likely be gained through proper diet and exercise. °· Women and the elderly have smaller amounts of body water than men. As a result women and the elderly achieve a higher blood alcohol concentration after drinking the same amount of alcohol. °· Exposing a fetus to alcohol can cause a broad range of birth defects referred to as Fetal Alcohol Syndrome (FAS) or Alcohol-Related Birth Defects (ARBD). Although FAS/ARBD is connected with excessive  alcohol consumption during pregnancy, studies also have reported neurobehavioral problems in infants born to mothers reporting drinking an average of 1 drink per day during pregnancy. °· Heavier drinking (the consumption of more than 4 drinks per occasion by men and more than 3 drinks per occasion by women) impairs learning (cognitive) and psychomotor functions and increases the risk of alcohol-related problems, including accidents and injuries. °CAGE QUESTIONS:  °· Have you ever felt that you should Cut down on your drinking? °· Have people Annoyed you by criticizing your drinking? °· Have you ever felt bad or Guilty about your drinking? °· Have you ever had a drink first thing in the morning to steady your nerves or get rid of a hangover (Eye opener)? °If you answered positively to any of these questions: You may be at risk for alcohol-related problems if alcohol consumption is:  °· Men: Greater than 14 drinks per week or more than 4 drinks per occasion. °· Women: Greater than 7 drinks per week or more than 3 drinks per occasion. °Do you or your family have a medical history of alcohol-related problems, such as: °· Blackouts. °· Sexual dysfunction. °· Depression. °· Trauma. °· Liver dysfunction. °· Sleep disorders. °· Hypertension. °· Chronic abdominal pain. °· Has your drinking ever caused you problems, such as problems with your family, problems with your work (or school) performance, or accidents/injuries? °· Do you have a compulsion to drink or a preoccupation with drinking? °· Do you have poor control or are you unable to stop drinking once you have started? °· Do you have to drink to   avoid withdrawal symptoms?  Do you have problems with withdrawal such as tremors, nausea, sweats, or mood disturbances?  Does it take more alcohol than in the past to get you high?  Do you feel a strong urge to drink?  Do you change your plans so that you can have a drink?  Do you ever drink in the morning to relieve  the shakes or a hangover? If you have answered a number of the previous questions positively, it may be time for you to talk to your caregivers, family, and friends and see if they think you have a problem. Alcoholism is a chemical dependency that keeps getting worse and will eventually destroy your health and relationships. Many alcoholics end up dead, impoverished, or in prison. This is often the end result of all chemical dependency.  Do not be discouraged if you are not ready to take action immediately.  Decisions to change behavior often involve up and down desires to change and feeling like you cannot decide.  Try to think more seriously about your drinking behavior.  Think of the reasons to quit. WHERE TO GO FOR ADDITIONAL INFORMATION   The [place] on Alcohol Abuse and Alcoholism (NIAAA) http://www.bradshaw.com/  [employer] on Alcoholism and Drug Dependence (NCADD) www.ncadd.[place] (ASAM) http://carpenter.net/  Document Released: 04/25/2005 Document Revised: 07/18/2011 Document Reviewed: 12/12/2007 [facility] Patient Information 2014 [place]. Please try to avoid alcohol in the future.  This only causes your pancreatitis to get worse.  Followup with your primary care physician.  Return anytime he your pain on nausea and vomiting.  Cannot be controlled at home

## 2013-09-18 NOTE — ED Provider Notes (Signed)
CSN: [id number]     Arrival date & time 09/18/13  1517 History   First MD Initiated Contact with Patient 09/18/13 2000     Chief Complaint  Patient presents with  . Abdominal Pain     (Consider location/radiation/quality/duration/timing/severity/associated sxs/prior Treatment) HPI Comments: Patient with a history of, alcohol abuse and chronic pancreatitis.  States, that he was worried about his girlfriend last night, and had been drinking beer all night.  He developed left upper quadrant and epigastric pain, associated with nausea and vomiting.  About 8 AM this morning.  He has not taken any medication.  Prior to arrival.  For his symptoms  Patient is a 43 y.o. male presenting with abdominal pain. The history is provided by the patient.  Abdominal Pain Pain location:  Epigastric and LUQ Pain quality: aching   Pain radiates to:  Back Pain severity:  Severe Onset quality:  Gradual Duration:  1 day Timing:  Constant Progression:  Worsening Chronicity:  Chronic Context: alcohol use   Associated symptoms: nausea and vomiting   Associated symptoms: no chest pain, no chills, no constipation, no cough, no diarrhea, no fever and no shortness of breath     Past Medical History  Diagnosis Date  . Hypertension   . Asthma   . Pancreatitis   . Cocaine abuse   . Depression   . H/O suicide attempt   . Heart murmur     "when he was little" (03/06/2013)  . Shortness of breath     "can happen at anytime" (03/06/2013)  . Anemia   . H/O hiatal hernia   . GERD (gastroesophageal reflux disease)   . Seizures     "weekly" (03/06/2013)  . Arthritis     "knees" (03/06/2013)  . Anxiety   . WPW (Wolff-Parkinson-White syndrome)     [name] 03/06/2013   Past Surgical History  Procedure Laterality Date  . Facial fracture surgery Left 1990's    "result of trauma" (03/06/2013)  . Eye surgery Left 1990's    "result of trauma" (03/06/2013)   Family History  Problem Relation Age of Onset  .  Hypertension Other   . Coronary artery disease Other    History  Substance Use Topics  . Smoking status: Current Every Day Smoker -- 1.00 packs/day for 30 years    Types: Cigarettes  . Smokeless tobacco: Current User  . Alcohol Use: 25.2 oz/week    2 Shots of liquor, 40 Cans of beer per week     Comment: 03/06/2013 "1, 40oz/day; plus couple shots liquor once/wk"    Review of Systems  Unable to perform ROS Constitutional: Negative for fever and chills.  Respiratory: Negative for cough and shortness of breath.   Cardiovascular: Negative for chest pain.  Gastrointestinal: Positive for nausea, vomiting and abdominal pain. Negative for diarrhea and constipation.  All other systems reviewed and are negative.     Allergies  Trazodone and nefazodone and Shellfish-derived products  Home Medications   Prior to Admission medications   Medication Sig Start Date End Date Taking? Authorizing Provider  acetaminophen (TYLENOL) 500 MG tablet Take 1,000 mg by mouth every 6 (six) hours as needed for mild pain.    Yes Historical Provider, MD  albuterol (PROVENTIL HFA;VENTOLIN HFA) 108 (90 BASE) MCG/ACT inhaler Inhale 1-2 puffs into the lungs every 6 (six) hours as needed for wheezing or shortness of breath.   Yes Historical Provider, MD  cloNIDine (CATAPRES) 0.1 MG tablet Take 0.1 mg by mouth 2 (two) times  daily.   Yes Historical Provider, MD  cyclobenzaprine (FLEXERIL) 10 MG tablet Take 1 tablet (10 mg total) by mouth 3 (three) times daily as needed for muscle spasms. 09/13/13  Yes Janice Norrie, MD  lansoprazole (PREVACID) 15 MG capsule Take 15 mg by mouth 2 (two) times daily as needed (for reflux).   Yes Historical Provider, MD  naproxen (NAPROSYN) 375 MG tablet Take 1 tablet (375 mg total) by mouth 2 (two) times daily. 09/13/13  Yes Janice Norrie, MD  QUEtiapine Fumarate (SEROQUEL XR) 150 MG 24 hr tablet Take 150 mg by mouth at bedtime.   Yes Historical Provider, MD   BP 133/77  Pulse 110  Temp(Src)  98.2 F (36.8 C) (Oral)  Resp 16  SpO2 100% Physical Exam  Nursing note and vitals reviewed. Constitutional: He appears well-developed and well-nourished. No distress.  HENT:  Head: Normocephalic and atraumatic.  Mouth/Throat: Oropharynx is clear and moist.  Eyes: Pupils are equal, round, and reactive to light.  Neck: Normal range of motion.  Cardiovascular: Normal rate and regular rhythm.   Pulmonary/Chest: Effort normal and breath sounds normal.  Abdominal: He exhibits no distension. There is tenderness in the epigastric area and left upper quadrant.    Musculoskeletal: Normal range of motion.  Neurological: He is alert.  Skin: Skin is warm. No rash noted. No erythema.    ED Course  Procedures (including critical care time) Labs Review Labs Reviewed  CBC WITH DIFFERENTIAL - Abnormal; Notable for the following:    RBC 3.22 (*)    Hemoglobin 11.2 (*)    HCT 32.0 (*)    MCH 34.8 (*)    RDW 16.6 (*)    Platelets 81 (*)    All other components within normal limits  COMPREHENSIVE METABOLIC PANEL - Abnormal; Notable for the following:    Sodium 134 (*)    CO2 18 (*)    Glucose, Bld 135 (*)    Albumin 2.7 (*)    AST 523 (*)    ALT 278 (*)    Alkaline Phosphatase 307 (*)    Total Bilirubin 3.0 (*)    All other components within normal limits  LIPASE, BLOOD - Abnormal; Notable for the following:    Lipase 237 (*)    All other components within normal limits  URINALYSIS, ROUTINE W REFLEX MICROSCOPIC - Abnormal; Notable for the following:    Color, Urine AMBER (*)    Bilirubin Urine SMALL (*)    All other components within normal limits  ETHANOL - Abnormal; Notable for the following:    Alcohol, Ethyl (B) 90 (*)    All other components within normal limits    Imaging Review No results found.   EKG Interpretation None      MDM  History discharged home with a prescription for Zofran, and, Percocet.  He's been encouraged to stop drinking.  Followup with his primary  care physician as needed.  He is been given return precautions Final diagnoses:  Pancreatitis  Alcohol abuse         Garald Balding, NP 09/18/13 2254  Garald Balding, NP 09/18/13 2257

## 2013-09-18 NOTE — ED Notes (Addendum)
etoh at 7am and then abd pain rads to  Left side vomited x 2

## 2013-09-19 NOTE — ED Provider Notes (Signed)
Medical screening examination/treatment/procedure(s) were performed by non-physician practitioner and as supervising physician I was immediately available for consultation/collaboration.    Dot Lanes, MD 09/19/13 [phone]

## 2013-10-05 ENCOUNTER — Inpatient Hospital Stay (HOSPITAL_COMMUNITY)
Admission: EM | Admit: 2013-10-05 | Discharge: 2013-10-22 | DRG: 917 | Disposition: A | Discharge status: Other Acute Inpatient Hospital | Payer: No Typology Code available for payment source | Attending: Internal Medicine | Admitting: Internal Medicine

## 2013-10-05 ENCOUNTER — Encounter (HOSPITAL_COMMUNITY): Payer: Self-pay | Admitting: Emergency Medicine

## 2013-10-05 DIAGNOSIS — D509 Iron deficiency anemia, unspecified: Secondary | ICD-10-CM | POA: Diagnosis present

## 2013-10-05 DIAGNOSIS — K449 Diaphragmatic hernia without obstruction or gangrene: Secondary | ICD-10-CM

## 2013-10-05 DIAGNOSIS — J45909 Unspecified asthma, uncomplicated: Secondary | ICD-10-CM

## 2013-10-05 DIAGNOSIS — F3289 Other specified depressive episodes: Secondary | ICD-10-CM

## 2013-10-05 DIAGNOSIS — D539 Nutritional anemia, unspecified: Secondary | ICD-10-CM

## 2013-10-05 DIAGNOSIS — X838XXA Intentional self-harm by other specified means, initial encounter: Secondary | ICD-10-CM

## 2013-10-05 DIAGNOSIS — R45851 Suicidal ideations: Secondary | ICD-10-CM

## 2013-10-05 DIAGNOSIS — E43 Unspecified severe protein-calorie malnutrition: Secondary | ICD-10-CM

## 2013-10-05 DIAGNOSIS — F329 Major depressive disorder, single episode, unspecified: Secondary | ICD-10-CM

## 2013-10-05 DIAGNOSIS — F1020 Alcohol dependence, uncomplicated: Secondary | ICD-10-CM

## 2013-10-05 DIAGNOSIS — T50992A Poisoning by other drugs, medicaments and biological substances, intentional self-harm, initial encounter: Secondary | ICD-10-CM | POA: Diagnosis present

## 2013-10-05 DIAGNOSIS — K859 Acute pancreatitis without necrosis or infection, unspecified: Secondary | ICD-10-CM

## 2013-10-05 DIAGNOSIS — R740 Nonspecific elevation of levels of transaminase and lactic acid dehydrogenase [LDH]: Secondary | ICD-10-CM

## 2013-10-05 DIAGNOSIS — E871 Hypo-osmolality and hyponatremia: Secondary | ICD-10-CM | POA: Diagnosis present

## 2013-10-05 DIAGNOSIS — D649 Anemia, unspecified: Secondary | ICD-10-CM

## 2013-10-05 DIAGNOSIS — T465X1A Poisoning by other antihypertensive drugs, accidental (unintentional), initial encounter: Secondary | ICD-10-CM | POA: Diagnosis present

## 2013-10-05 DIAGNOSIS — K861 Other chronic pancreatitis: Secondary | ICD-10-CM

## 2013-10-05 DIAGNOSIS — F411 Generalized anxiety disorder: Secondary | ICD-10-CM | POA: Diagnosis present

## 2013-10-05 DIAGNOSIS — F332 Major depressive disorder, recurrent severe without psychotic features: Secondary | ICD-10-CM

## 2013-10-05 DIAGNOSIS — E876 Hypokalemia: Secondary | ICD-10-CM | POA: Diagnosis present

## 2013-10-05 DIAGNOSIS — F17200 Nicotine dependence, unspecified, uncomplicated: Secondary | ICD-10-CM

## 2013-10-05 DIAGNOSIS — R7401 Elevation of levels of liver transaminase levels: Secondary | ICD-10-CM

## 2013-10-05 DIAGNOSIS — T481X4A Poisoning by skeletal muscle relaxants [neuromuscular blocking agents], undetermined, initial encounter: Principal | ICD-10-CM | POA: Diagnosis present

## 2013-10-05 DIAGNOSIS — G9340 Encephalopathy, unspecified: Secondary | ICD-10-CM

## 2013-10-05 DIAGNOSIS — F1411 Cocaine abuse, in remission: Secondary | ICD-10-CM

## 2013-10-05 DIAGNOSIS — F1990 Other psychoactive substance use, unspecified, uncomplicated: Secondary | ICD-10-CM | POA: Diagnosis present

## 2013-10-05 DIAGNOSIS — E872 Acidosis, unspecified: Secondary | ICD-10-CM | POA: Diagnosis present

## 2013-10-05 DIAGNOSIS — J309 Allergic rhinitis, unspecified: Secondary | ICD-10-CM

## 2013-10-05 DIAGNOSIS — Z862 Personal history of diseases of the blood and blood-forming organs and certain disorders involving the immune mechanism: Secondary | ICD-10-CM

## 2013-10-05 DIAGNOSIS — F10931 Alcohol use, unspecified with withdrawal delirium: Secondary | ICD-10-CM | POA: Diagnosis present

## 2013-10-05 DIAGNOSIS — F1010 Alcohol abuse, uncomplicated: Secondary | ICD-10-CM

## 2013-10-05 DIAGNOSIS — D72829 Elevated white blood cell count, unspecified: Secondary | ICD-10-CM | POA: Diagnosis present

## 2013-10-05 DIAGNOSIS — T50901A Poisoning by unspecified drugs, medicaments and biological substances, accidental (unintentional), initial encounter: Secondary | ICD-10-CM

## 2013-10-05 DIAGNOSIS — F1994 Other psychoactive substance use, unspecified with psychoactive substance-induced mood disorder: Secondary | ICD-10-CM | POA: Diagnosis present

## 2013-10-05 DIAGNOSIS — R079 Chest pain, unspecified: Secondary | ICD-10-CM

## 2013-10-05 DIAGNOSIS — M1710 Unilateral primary osteoarthritis, unspecified knee: Secondary | ICD-10-CM | POA: Diagnosis present

## 2013-10-05 DIAGNOSIS — I10 Essential (primary) hypertension: Secondary | ICD-10-CM

## 2013-10-05 DIAGNOSIS — I456 Pre-excitation syndrome: Secondary | ICD-10-CM

## 2013-10-05 DIAGNOSIS — T1491XA Suicide attempt, initial encounter: Secondary | ICD-10-CM

## 2013-10-05 DIAGNOSIS — D6959 Other secondary thrombocytopenia: Secondary | ICD-10-CM | POA: Diagnosis present

## 2013-10-05 DIAGNOSIS — R55 Syncope and collapse: Secondary | ICD-10-CM

## 2013-10-05 DIAGNOSIS — I498 Other specified cardiac arrhythmias: Secondary | ICD-10-CM | POA: Diagnosis present

## 2013-10-05 DIAGNOSIS — F1910 Other psychoactive substance abuse, uncomplicated: Secondary | ICD-10-CM

## 2013-10-05 DIAGNOSIS — Z781 Physical restraint status: Secondary | ICD-10-CM | POA: Diagnosis present

## 2013-10-05 DIAGNOSIS — K7010 Alcoholic hepatitis without ascites: Secondary | ICD-10-CM | POA: Diagnosis present

## 2013-10-05 DIAGNOSIS — R000 Tachycardia, unspecified: Secondary | ICD-10-CM

## 2013-10-05 DIAGNOSIS — Z8639 Personal history of other endocrine, nutritional and metabolic disease: Secondary | ICD-10-CM

## 2013-10-05 DIAGNOSIS — M6740 Ganglion, unspecified site: Secondary | ICD-10-CM

## 2013-10-05 DIAGNOSIS — R109 Unspecified abdominal pain: Secondary | ICD-10-CM

## 2013-10-05 DIAGNOSIS — IMO0002 Reserved for concepts with insufficient information to code with codable children: Secondary | ICD-10-CM

## 2013-10-05 DIAGNOSIS — K219 Gastro-esophageal reflux disease without esophagitis: Secondary | ICD-10-CM | POA: Diagnosis present

## 2013-10-05 DIAGNOSIS — Z79899 Other long term (current) drug therapy: Secondary | ICD-10-CM

## 2013-10-05 DIAGNOSIS — F10231 Alcohol dependence with withdrawal delirium: Secondary | ICD-10-CM | POA: Diagnosis present

## 2013-10-05 DIAGNOSIS — K089 Disorder of teeth and supporting structures, unspecified: Secondary | ICD-10-CM

## 2013-10-05 DIAGNOSIS — M549 Dorsalgia, unspecified: Secondary | ICD-10-CM

## 2013-10-05 LAB — COMPREHENSIVE METABOLIC PANEL
ALBUMIN: 1.7 g/dL — AB (ref 3.5–5.2)
ALT: 102 U/L — ABNORMAL HIGH (ref 0–53)
AST: 268 U/L — AB (ref 0–37)
Alkaline Phosphatase: 393 U/L — ABNORMAL HIGH (ref 39–117)
BUN: 8 mg/dL (ref 6–23)
CALCIUM: 7.2 mg/dL — AB (ref 8.4–10.5)
CO2: 20 mEq/L (ref 19–32)
CREATININE: 0.73 mg/dL (ref 0.50–1.35)
Chloride: 99 mEq/L (ref 96–112)
GFR calc Af Amer: >90 mL/min (ref >90)
GFR calc non Af Amer: >90 mL/min (ref >90)
Glucose, Bld: 142 mg/dL — ABNORMAL HIGH (ref 70–99)
Potassium: 4.2 mEq/L (ref 3.7–5.3)
Sodium: 133 mEq/L — ABNORMAL LOW (ref 137–147)
Total Bilirubin: 2.1 mg/dL — ABNORMAL HIGH (ref 0.3–1.2)
Total Protein: 5.7 g/dL — ABNORMAL LOW (ref 6.0–8.3)

## 2013-10-05 LAB — CBC
HCT: 20.4 % — ABNORMAL LOW (ref 39.0–52.0)
Hemoglobin: 7.2 g/dL — ABNORMAL LOW (ref 13.0–17.0)
MCH: 35.8 pg — AB (ref 26.0–34.0)
MCHC: 35.3 g/dL (ref 30.0–36.0)
MCV: 101.5 fL — AB (ref 78.0–100.0)
PLATELETS: 91 10*3/uL — AB (ref 150–400)
RBC: 2.01 MIL/uL — ABNORMAL LOW (ref 4.22–5.81)
RDW: 17.8 % — ABNORMAL HIGH (ref 11.5–15.5)
WBC: 7.6 10*3/uL (ref 4.0–10.5)

## 2013-10-05 LAB — ACETAMINOPHEN LEVEL: Acetaminophen (Tylenol), Serum: <15 ug/mL (ref 10–30)

## 2013-10-05 LAB — I-STAT VENOUS BLOOD GAS, ED
ACID-BASE DEFICIT: 6 mmol/L — AB (ref 0.0–2.0)
BICARBONATE: 19 meq/L — AB (ref 20.0–24.0)
O2 SAT: 89 %
Patient temperature: 97.8
TCO2: 20 mmol/L (ref 0–100)
pCO2, Ven: 33.6 mmHg — ABNORMAL LOW (ref 45.0–50.0)
pH, Ven: 7.358 — ABNORMAL HIGH (ref 7.250–7.300)
pO2, Ven: 58 mmHg — ABNORMAL HIGH (ref 30.0–45.0)

## 2013-10-05 LAB — ETHANOL: Alcohol, Ethyl (B): 298 mg/dL — ABNORMAL HIGH (ref 0–11)

## 2013-10-05 LAB — SALICYLATE LEVEL

## 2013-10-05 LAB — I-STAT CG4 LACTIC ACID, ED: Lactic Acid, Venous: 3.64 mmol/L — ABNORMAL HIGH (ref 0.5–2.2)

## 2013-10-05 LAB — MAGNESIUM: Magnesium: 1.5 mg/dL (ref 1.5–2.5)

## 2013-10-05 MED ORDER — ONDANSETRON HCL 4 MG/2ML IJ SOLN
4.0000 mg | Freq: Once | INTRAMUSCULAR | Status: AC
  Administered 2013-10-06: 4 mg via INTRAVENOUS
  Filled 2013-10-05: qty 2

## 2013-10-05 MED ORDER — CHARCOAL ACTIVATED PO LIQD
50.0000 g | Freq: Once | ORAL | Status: AC
  Administered 2013-10-05: 50 g via ORAL
  Filled 2013-10-05: qty 240

## 2013-10-05 MED ORDER — SODIUM CHLORIDE 0.9 % IV BOLUS (SEPSIS)
500.0000 mL | Freq: Once | INTRAVENOUS | Status: AC
  Administered 2013-10-05: 500 mL via INTRAVENOUS

## 2013-10-05 MED ORDER — SODIUM CHLORIDE 0.9 % IV BOLUS (SEPSIS)
1000.0000 mL | Freq: Once | INTRAVENOUS | Status: AC
  Administered 2013-10-05: 1000 mL via INTRAVENOUS

## 2013-10-05 NOTE — ED Notes (Addendum)
Pt arrives via EMS from home with purposeful overdose. Per EMS, pt took about 30 clonidine, 15 flexeril, 5 percocet.   Vomited PTA. Girlfriend is pregnant and decided to have abortion, so pt decided to attempt suicide. Pupils 3cm and PERRLA. EMS VS 132/88, 110, RR 18, 10 minutes later, BP started to diminish. Last set BP 90/60 HR 90 RR 14. IV placed 20 G left forearm. 200 CC NS intake. NS on monitor PTA. No thoughts of HI, just SI. Oriented to self, not location, not year.

## 2013-10-05 NOTE — ED Notes (Signed)
Dr Kathrynn Humble given a copy of lactic acid 3.64

## 2013-10-05 NOTE — ED Notes (Signed)
Called [facility] at Poison Control: They recommended that we watch for hypotension and bradycardia r/t clonidine; respiratory depression and hypoxia r/t percocet; and they stated' flexeril will just have to wear off."  They recommended dopamine for bradycardia. They will follow-up in 3 hrs.EDP made aware.

## 2013-10-06 ENCOUNTER — Emergency Department (HOSPITAL_COMMUNITY): Payer: Self-pay

## 2013-10-06 ENCOUNTER — Encounter (HOSPITAL_COMMUNITY): Payer: Self-pay | Admitting: *Deleted

## 2013-10-06 DIAGNOSIS — F1990 Other psychoactive substance use, unspecified, uncomplicated: Secondary | ICD-10-CM | POA: Diagnosis present

## 2013-10-06 DIAGNOSIS — D649 Anemia, unspecified: Secondary | ICD-10-CM

## 2013-10-06 DIAGNOSIS — R45851 Suicidal ideations: Secondary | ICD-10-CM

## 2013-10-06 LAB — PROTIME-INR
INR: 1.43 (ref 0.00–1.49)
INR: 1.53 — ABNORMAL HIGH (ref 0.00–1.49)
Prothrombin Time: 17.1 seconds — ABNORMAL HIGH (ref 11.6–15.2)
Prothrombin Time: 18 seconds — ABNORMAL HIGH (ref 11.6–15.2)

## 2013-10-06 LAB — CBC
HCT: 20.4 % — ABNORMAL LOW (ref 39.0–52.0)
Hemoglobin: 7.1 g/dL — ABNORMAL LOW (ref 13.0–17.0)
MCH: 35.3 pg — ABNORMAL HIGH (ref 26.0–34.0)
MCHC: 34.8 g/dL (ref 30.0–36.0)
MCV: 101.5 fL — ABNORMAL HIGH (ref 78.0–100.0)
PLATELETS: 91 10*3/uL — AB (ref 150–400)
RBC: 2.01 MIL/uL — ABNORMAL LOW (ref 4.22–5.81)
RDW: 18.1 % — AB (ref 11.5–15.5)
WBC: 7.4 10*3/uL (ref 4.0–10.5)

## 2013-10-06 LAB — TYPE AND SCREEN
ABO/RH(D): B POS
ANTIBODY SCREEN: NEGATIVE

## 2013-10-06 LAB — COMPREHENSIVE METABOLIC PANEL
ALT: 111 U/L — ABNORMAL HIGH (ref 0–53)
AST: 261 U/L — ABNORMAL HIGH (ref 0–37)
Albumin: 1.6 g/dL — ABNORMAL LOW (ref 3.5–5.2)
Alkaline Phosphatase: 404 U/L — ABNORMAL HIGH (ref 39–117)
BILIRUBIN TOTAL: 2 mg/dL — AB (ref 0.3–1.2)
BUN: 7 mg/dL (ref 6–23)
CHLORIDE: 101 meq/L (ref 96–112)
CO2: 14 mEq/L — ABNORMAL LOW (ref 19–32)
Calcium: 7.3 mg/dL — ABNORMAL LOW (ref 8.4–10.5)
Creatinine, Ser: 0.64 mg/dL (ref 0.50–1.35)
GFR calc non Af Amer: >90 mL/min (ref >90)
GLUCOSE: 137 mg/dL — AB (ref 70–99)
POTASSIUM: 4.2 meq/L (ref 3.7–5.3)
Sodium: 133 mEq/L — ABNORMAL LOW (ref 137–147)
Total Protein: 5.7 g/dL — ABNORMAL LOW (ref 6.0–8.3)

## 2013-10-06 LAB — HEMOGLOBIN AND HEMATOCRIT, BLOOD
HCT: 21 % — ABNORMAL LOW (ref 39.0–52.0)
HCT: 20.1 % — ABNORMAL LOW (ref 39.0–52.0)
HEMOGLOBIN: 7.3 g/dL — AB (ref 13.0–17.0)
HEMOGLOBIN: 7 g/dL — AB (ref 13.0–17.0)

## 2013-10-06 LAB — RETICULOCYTES
RBC.: 2.01 MIL/uL — ABNORMAL LOW (ref 4.22–5.81)
Retic Count, Absolute: 70.4 10*3/uL (ref 19.0–186.0)
Retic Ct Pct: 3.5 % — ABNORMAL HIGH (ref 0.4–3.1)

## 2013-10-06 LAB — RAPID URINE DRUG SCREEN, HOSP PERFORMED
Amphetamines: NOT DETECTED
BENZODIAZEPINES: NOT DETECTED
Barbiturates: NOT DETECTED
COCAINE: NOT DETECTED
Opiates: NOT DETECTED
Tetrahydrocannabinol: NOT DETECTED

## 2013-10-06 LAB — URINALYSIS, ROUTINE W REFLEX MICROSCOPIC
Bilirubin Urine: NEGATIVE
Glucose, UA: NEGATIVE mg/dL
Hgb urine dipstick: NEGATIVE
KETONES UR: NEGATIVE mg/dL
LEUKOCYTES UA: NEGATIVE
Nitrite: NEGATIVE
PROTEIN: NEGATIVE mg/dL
Specific Gravity, Urine: 1.018 (ref 1.005–1.030)
Urobilinogen, UA: 1 mg/dL (ref 0.0–1.0)
pH: 5 (ref 5.0–8.0)

## 2013-10-06 LAB — IRON AND TIBC
Iron: 121 ug/dL (ref 42–135)
UIBC: <15 ug/dL — ABNORMAL LOW (ref 125–400)

## 2013-10-06 LAB — LACTIC ACID, PLASMA: Lactic Acid, Venous: 3.6 mmol/L — ABNORMAL HIGH (ref 0.5–2.2)

## 2013-10-06 LAB — I-STAT CG4 LACTIC ACID, ED: Lactic Acid, Venous: 3.54 mmol/L — ABNORMAL HIGH (ref 0.5–2.2)

## 2013-10-06 LAB — HEPATITIS PANEL, ACUTE
HCV Ab: NEGATIVE
HEP B C IGM: NONREACTIVE
Hep A IgM: NONREACTIVE
Hepatitis B Surface Ag: NEGATIVE

## 2013-10-06 LAB — FERRITIN: FERRITIN: 1370 ng/mL — AB (ref 22–322)

## 2013-10-06 LAB — FOLATE: Folate: >20 ng/mL

## 2013-10-06 LAB — VITAMIN B12: Vitamin B-12: 1251 pg/mL — ABNORMAL HIGH (ref 211–911)

## 2013-10-06 LAB — ABO/RH: ABO/RH(D): B POS

## 2013-10-06 LAB — POC OCCULT BLOOD, ED: Fecal Occult Bld: NEGATIVE

## 2013-10-06 MED ORDER — CHLORDIAZEPOXIDE HCL 25 MG PO CAPS: 25.0000 mg | ORAL_CAPSULE | Freq: Four times a day (QID) | ORAL | Status: DC | PRN

## 2013-10-06 MED ORDER — VITAMIN B-1 100 MG PO TABS
100.0000 mg | ORAL_TABLET | Freq: Every day | ORAL | Status: DC
  Administered 2013-10-06: 100 mg via ORAL
  Filled 2013-10-06 (×2): qty 1

## 2013-10-06 MED ORDER — LORAZEPAM 2 MG/ML IJ SOLN
2.0000 mg | INTRAMUSCULAR | Status: DC | PRN
  Administered 2013-10-06 (×3): 2 mg via INTRAVENOUS
  Administered 2013-10-07 (×3): 3 mg via INTRAVENOUS
  Administered 2013-10-07: 2 mg via INTRAVENOUS
  Administered 2013-10-07: 3 mg via INTRAVENOUS
  Administered 2013-10-07 (×4): 2 mg via INTRAVENOUS
  Administered 2013-10-07 – 2013-10-08 (×3): 3 mg via INTRAVENOUS
  Administered 2013-10-08: 2 mg via INTRAVENOUS
  Administered 2013-10-08: 3 mg via INTRAVENOUS
  Filled 2013-10-06 (×3): qty 2
  Filled 2013-10-06 (×2): qty 1
  Filled 2013-10-06: qty 2
  Filled 2013-10-06: qty 1
  Filled 2013-10-06 (×2): qty 2
  Filled 2013-10-06: qty 1
  Filled 2013-10-06: qty 2
  Filled 2013-10-06 (×4): qty 1
  Filled 2013-10-06: qty 2
  Filled 2013-10-06: qty 1

## 2013-10-06 MED ORDER — LACTATED RINGERS IV SOLN: INTRAVENOUS | Status: DC

## 2013-10-06 MED ORDER — THIAMINE HCL 100 MG/ML IJ SOLN
Freq: Once | INTRAVENOUS | Status: AC
  Administered 2013-10-06: 05:00:00 via INTRAVENOUS
  Filled 2013-10-06: qty 1000

## 2013-10-06 MED ORDER — ONDANSETRON HCL 4 MG/2ML IJ SOLN: 4.0000 mg | Freq: Three times a day (TID) | INTRAMUSCULAR | Status: DC | PRN

## 2013-10-06 MED ORDER — SODIUM CHLORIDE 0.9 % IV SOLN
INTRAVENOUS | Status: DC
  Administered 2013-10-06 – 2013-10-08 (×3): via INTRAVENOUS

## 2013-10-06 MED ORDER — ONDANSETRON HCL 4 MG/2ML IJ SOLN: 4.0000 mg | Freq: Four times a day (QID) | INTRAMUSCULAR | Status: DC | PRN

## 2013-10-06 MED ORDER — FOLIC ACID 1 MG PO TABS
1.0000 mg | ORAL_TABLET | Freq: Every day | ORAL | Status: DC
  Administered 2013-10-06: 1 mg via ORAL
  Filled 2013-10-06 (×2): qty 1

## 2013-10-06 MED ORDER — ONDANSETRON HCL 4 MG PO TABS: 4.0000 mg | ORAL_TABLET | Freq: Four times a day (QID) | ORAL | Status: DC | PRN

## 2013-10-06 MED ORDER — ALBUTEROL SULFATE (2.5 MG/3ML) 0.083% IN NEBU: 2.5000 mg | INHALATION_SOLUTION | Freq: Four times a day (QID) | RESPIRATORY_TRACT | Status: DC | PRN

## 2013-10-06 NOTE — ED Notes (Signed)
MD at bedside. 

## 2013-10-06 NOTE — ED Notes (Signed)
Admitting MD at bedside.

## 2013-10-06 NOTE — ED Notes (Signed)
Pt valubles envelope placed in safe in security office , no key for RN to keep track of. IVC paperwork will be tubed to 3s upon completion by Dr. Kathrynn Humble
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
## 2013-10-06 NOTE — Progress Notes (Signed)
Received pt to 3S14.  Pt arousable and irritable.  Confused to time and place.  Incontinent of urine on ED stretcher.  Suicide precautions initiated. Sitter at bedside. Pt refused MRSA swab.  CHG completed.  Oriented to room and plan of care. Will continue to monitor.

## 2013-10-06 NOTE — H&P (Signed)
PCP:   Angelica Chessman, MD   Chief Complaint:  overdose  HPI: 43 yo male h/o etoh abuse, elevated lfts, depression/anxiety comes in after intentionally ingesting some of his home meds to kill himself per intial report he told the ED on arrival.  He tells me he did this to try to get high off of the flexeril.  He says he took 15 pills, combination of some of his flexeril and some of his clonidine.  His girlfriend is pregnant and is planning on getting an abortion tomorrow, and they were argueing about that, and he got very upset and took a bunch of his pills.  This occurred at about 10pm.  He received charcoal in the ED.  He is sedated but able to protect his airway.  His vitals are all stable.  He also reports he has been drinking beer.  Review of Systems:  unobtainable  Past Medical History: Past Medical History  Diagnosis Date  . Hypertension   . Asthma   . Pancreatitis   . Cocaine abuse   . Depression   . H/O suicide attempt   . Heart murmur     "when he was little" (03/06/2013)  . Shortness of breath     "can happen at anytime" (03/06/2013)  . Anemia   . H/O hiatal hernia   . GERD (gastroesophageal reflux disease)   . Seizures     "weekly" (03/06/2013)  . Arthritis     "knees" (03/06/2013)  . Anxiety   . WPW (Wolff-Parkinson-White syndrome)     [name] 03/06/2013   Past Surgical History  Procedure Laterality Date  . Facial fracture surgery Left 1990's    "result of trauma" (03/06/2013)  . Eye surgery Left 1990's    "result of trauma" (03/06/2013)    Medications: Prior to Admission medications   Medication Sig Start Date End Date Taking? Authorizing Provider  acetaminophen (TYLENOL) 500 MG tablet Take 1,000 mg by mouth every 6 (six) hours as needed for mild pain.    Yes Historical Provider, MD  albuterol (PROVENTIL HFA;VENTOLIN HFA) 108 (90 BASE) MCG/ACT inhaler Inhale 1-2 puffs into the lungs every 6 (six) hours as needed for wheezing or shortness of breath.    Yes Historical Provider, MD  cloNIDine (CATAPRES) 0.1 MG tablet Take 0.1 mg by mouth 2 (two) times daily.   Yes Historical Provider, MD  cyclobenzaprine (FLEXERIL) 10 MG tablet Take 1 tablet (10 mg total) by mouth 3 (three) times daily as needed for muscle spasms. 09/13/13  Yes Janice Norrie, MD  lansoprazole (PREVACID) 15 MG capsule Take 15 mg by mouth 2 (two) times daily as needed (for reflux).   Yes Historical Provider, MD  naproxen (NAPROSYN) 375 MG tablet Take 1 tablet (375 mg total) by mouth 2 (two) times daily. 09/13/13  Yes Janice Norrie, MD  ondansetron (ZOFRAN ODT) 4 MG disintegrating tablet Take 1 tablet (4 mg total) by mouth every 8 (eight) hours as needed for nausea or vomiting. 09/18/13  Yes Garald Balding, NP  oxyCODONE-acetaminophen (PERCOCET/ROXICET) 5-325 MG per tablet Take 1 tablet by mouth every 6 (six) hours as needed for severe pain. 09/18/13  Yes Garald Balding, NP  QUEtiapine Fumarate (SEROQUEL XR) 150 MG 24 hr tablet Take 150 mg by mouth at bedtime.   Yes Historical Provider, MD    Allergies:   Allergies  Allergen Reactions  . Trazodone And Nefazodone Other (See Comments)    Muscle spasms  . Shellfish-Derived Products Nausea And Vomiting  Social History:  reports that he has been smoking Cigarettes.  He has a 30 pack-year smoking history. He uses smokeless tobacco. He reports that he drinks about 25.2 ounces of alcohol per week. He reports that he uses illicit drugs (Cocaine and Marijuana).  Family History: Family History  Problem Relation Age of Onset  . Hypertension Other   . Coronary artery disease Other     Physical Exam: Filed Vitals:   10/05/13 2345 10/06/13 0002 10/06/13 0007 10/06/13 0009  BP: 139/99  139/99   Pulse: 88  88   Temp:    97.7 F (36.5 C)  TempSrc:    Temporal  Resp:   10   Height:  5\' 10"  (1.778 m)    Weight:  79.379 kg (175 lb)    SpO2: 95%      General appearance: no distress, slowed mentation and uncooperative Head: Normocephalic,  without obvious abnormality, atraumatic Eyes: negative Nose: Nares normal. Septum midline. Mucosa normal. No drainage or sinus tenderness. Neck: no JVD and supple, symmetrical, trachea midline Lungs: clear to auscultation bilaterally Heart: regular rate and rhythm, S1, S2 normal, no murmur, click, rub or gallop Abdomen: soft, non-tender; bowel sounds normal; no masses,  no organomegaly Extremities: extremities normal, atraumatic, no cyanosis or edema Pulses: 2+ and symmetric Skin: Skin color, texture, turgor normal. No rashes or lesions Neurologic: Grossly normal   Labs on Admission:   Recent Labs  10/05/13 2310  NA 133*  K 4.2  CL 99  CO2 20  GLUCOSE 142*  BUN 8  CREATININE 0.73  CALCIUM 7.2*  MG 1.5    Recent Labs  10/05/13 2310  AST 268*  ALT 102*  ALKPHOS 393*  BILITOT 2.1*  PROT 5.7*  ALBUMIN 1.7*    Recent Labs  10/05/13 2310  WBC 7.6  HGB 7.2*  HCT 20.4*  MCV 101.5*  PLT 91*   Radiological Exams on Admission:  Dg Chest Portable 1 View  10/06/2013   CLINICAL DATA:  Drug overdose  EXAM: PORTABLE CHEST - 1 VIEW  COMPARISON:  08/16/2013  FINDINGS: There is interstitial coarsening and mild hypoaeration. Normal heart size and mediastinal contours. No effusion or pneumothorax.  IMPRESSION: Diffuse interstitial prominence, likely from hypoaeration. Cannot exclude mild aspiration pneumonitis.   Electronically Signed   By: Jorje Guild M.D.   On: 10/06/2013 00:56   Dg Abd Portable 1v  10/06/2013   CLINICAL DATA:  43 year old male drug overdose. Initial encounter.  EXAM: PORTABLE ABDOMEN - 1 VIEW  COMPARISON:  CT Abdomen and Pelvis 4 levin 2014.  FINDINGS: Portable AP supine view at 0046 hrs. Non obstructed bowel gas pattern. Stable right hemipelvis phlebolith. Evidence of a healed posterior right tenth rib fracture. No acute osseous abnormality identified. No definite pneumoperitoneum on this supine view.  IMPRESSION: Non obstructed bowel gas pattern.    Electronically Signed   By: Lars Pinks M.D.   On: 10/06/2013 00:56    Assessment/Plan  43 yo male with suicide attempt ingestion of unknown combination of flexeril and clonidine  Principal Problem:   Suicide attempt by inadequate means-  Supportive care.  Received charcoal soon after ingestion.  Ivf.  Pt is IVC'd, will need psych eval once medically cleared.  Place in stepdown.  Active Problems:   ALCOHOL ABUSE-  Highly suspect he has cirrhosis of the liver, as demonstrated by his labs dating back the past year.  Ck acute hep panel.  Ck coags.  Will wait on ativan for now as he  is already quite sedated likely from the etoh/flexeril.   SUBSTANCE ABUSE, MULTIPLE-  uds and ua are pending   LIVER FUNCTION TESTS, ABNORMAL, HX OF   [name] Parkinson White pattern seen on electrocardiogram-  Cardiac monitoring   Encephalopathy   Overdose   Macrocytic anemia-  Has had worsening of his anemia, heme occult was neg with edp exam.  No overt bleeding.  Will ck anemia panel.  Repeat h/h now.  And serial h/h.  Will hold off on transfusion unless he drops below 7 or overt bleeding develops.  Cannot obtain adequate history from him in his current state.    Admit to stepdown, full code.  IVC patient, one to one sitter ordered.  Morgana Rowley A Shanon Brow 10/06/2013, 1:47 AM

## 2013-10-06 NOTE — ED Notes (Addendum)
Pt valuables including 2 cells phones and one wallet containing no cash is being locked in security locker at this time. Pt valuables envelope [id number]. Pt belongings including one pair black shoes, pants, belt, and shirts being sent up to floor. Pt will be transferred upon completion of IVC papers. 3S RN aware.

## 2013-10-06 NOTE — ED Notes (Signed)
Spoke with CJ at [employer] and informed him of the status of pt at this time, he recommends continuing to monitor pt and will follow up in the morning with 3S.

## 2013-10-06 NOTE — ED Notes (Signed)
Report given to [facility] on 3S.

## 2013-10-06 NOTE — Progress Notes (Signed)
[facility] TEAM 1 - Stepdown/ICU TEAM Progress Note  [name] ZHG:[id number] DOB: 1970/07/14 DOA: 10/05/2013 PCP: Angelica Chessman, MD  Admit HPI / Brief Narrative: 42 yo M w/ h/o etoh abuse, elevated lfts, depression/anxiety who came to the ED after intentionally ingesting some of his home meds to kill himself per his own report to the ED staff. He later changed his story stating he was trying to get high off the flexeril. He said he took 15 pills; a combination of flexeril and clonidine. He reported that his girlfriend is pregnant and is planning on getting an abortion, and they were argueing about that.  He said he got very upset and took a bunch of his pills. This occurred at about 10pm. He received charcoal in the ED.   HPI/Subjective: PT seen for f/u visit.  Assessment/Plan:  Suicidal ideation  Intentional prescription drug OD ASA and APAP levels undetectable   Lactic acidosis  HTN BP currently stable   EtOH abuse EtOH level 298 at presentation   Transaminitis Elevated in pattern c/w EtOH damage - INR elevated at 1.53  WPW?  Macrocytic anemia Hgb 7.2 at presentation - guiac negative  Hx of cocaine abuse  UDS negative at admit   Code Status: FULL Family Communication: no family present at time of exam Disposition Plan: SDU  Consultants: none  Procedures: none  Antibiotics: none  DVT prophylaxis: SCDs  Objective: Blood pressure 105/72, pulse 73, temperature 98 F (36.7 C), temperature source Oral, resp. rate 19, height 5\' 9"  (1.753 m), weight 59.2 kg (130 lb 8.2 oz), SpO2 100.00%.  Intake/Output Summary (Last 24 hours) at 10/06/13 1444 Last data filed at 10/06/13 1401  Gross per 24 hour  Intake   2120 ml  Output   1925 ml  Net    195 ml   Exam: F/U exam completed  Data Reviewed: Basic Metabolic Panel:  Recent Labs Lab 10/05/13 2310 10/06/13 0600  NA 133* 133*  K 4.2 4.2  CL 99 101  CO2 20 14*  GLUCOSE 142* 137*  BUN 8 7    CREATININE 0.73 0.64  CALCIUM 7.2* 7.3*  MG 1.5  --    Liver Function Tests:  Recent Labs Lab 10/05/13 2310 10/06/13 0600  AST 268* 261*  ALT 102* 111*  ALKPHOS 393* 404*  BILITOT 2.1* 2.0*  PROT 5.7* 5.7*  ALBUMIN 1.7* 1.6*   No results found for this basename: LIPASE, AMYLASE,  in the last 168 hours No results found for this basename: AMMONIA,  in the last 168 hours  CBC:  Recent Labs Lab 10/05/13 2310 10/06/13 0305 10/06/13 0600 10/06/13 1000  WBC 7.6  --  7.4  --   HGB 7.2* 7.3* 7.1* 7.0*  HCT 20.4* 21.0* 20.4* 20.1*  MCV 101.5*  --  101.5*  --   PLT 91*  --  91*  --    Cardiac Enzymes: No results found for this basename: CKTOTAL, CKMB, CKMBINDEX, TROPONINI,  in the last 168 hours BNP (last 3 results) No results found for this basename: PROBNP,  in the last 8760 hours CBG: No results found for this basename: GLUCAP,  in the last 168 hours  No results found for this or any previous visit (from the past 240 hour(s)).   Studies:  Recent x-ray studies have been reviewed in detail by the Attending Physician  Scheduled Meds:  Scheduled Meds: . lactated ringers   Intravenous STAT    Time spent on care of this patient: 25+  mins   Cherene Altes , MD   [facility] Office  [phone] Pager - Text Page per Amion as per below:  On-Call/Text Page:      [email or web address]      password [email or web address]  If 7PM-7AM, please contact night-coverage [email or web address] Password [email or web address] 10/06/2013, 2:44 PM   LOS: 1 day

## 2013-10-06 NOTE — ED Notes (Signed)
Dr Kathrynn Humble given a copy of lactic acid results 3.54

## 2013-10-07 LAB — COMPREHENSIVE METABOLIC PANEL
ALT: 109 U/L — AB (ref 0–53)
AST: 262 U/L — ABNORMAL HIGH (ref 0–37)
Albumin: 1.8 g/dL — ABNORMAL LOW (ref 3.5–5.2)
Alkaline Phosphatase: 418 U/L — ABNORMAL HIGH (ref 39–117)
BUN: 4 mg/dL — ABNORMAL LOW (ref 6–23)
CO2: 18 meq/L — AB (ref 19–32)
CREATININE: 0.66 mg/dL (ref 0.50–1.35)
Calcium: 7.7 mg/dL — ABNORMAL LOW (ref 8.4–10.5)
Chloride: 98 mEq/L (ref 96–112)
GLUCOSE: 76 mg/dL (ref 70–99)
Potassium: 4 mEq/L (ref 3.7–5.3)
Sodium: 134 mEq/L — ABNORMAL LOW (ref 137–147)
TOTAL PROTEIN: 6.2 g/dL (ref 6.0–8.3)
Total Bilirubin: 3.5 mg/dL — ABNORMAL HIGH (ref 0.3–1.2)

## 2013-10-07 LAB — CBC
HEMATOCRIT: 25.6 % — AB (ref 39.0–52.0)
Hemoglobin: 8.6 g/dL — ABNORMAL LOW (ref 13.0–17.0)
MCH: 35.7 pg — ABNORMAL HIGH (ref 26.0–34.0)
MCHC: 33.6 g/dL (ref 30.0–36.0)
MCV: 106.2 fL — ABNORMAL HIGH (ref 78.0–100.0)
Platelets: 70 10*3/uL — ABNORMAL LOW (ref 150–400)
RBC: 2.41 MIL/uL — AB (ref 4.22–5.81)
RDW: 18 % — ABNORMAL HIGH (ref 11.5–15.5)
WBC: 8.9 10*3/uL (ref 4.0–10.5)

## 2013-10-07 LAB — GLUCOSE, CAPILLARY: Glucose-Capillary: 138 mg/dL — ABNORMAL HIGH (ref 70–99)

## 2013-10-07 LAB — LACTIC ACID, PLASMA: Lactic Acid, Venous: 3.4 mmol/L — ABNORMAL HIGH (ref 0.5–2.2)

## 2013-10-07 LAB — MRSA PCR SCREENING: MRSA BY PCR: NEGATIVE

## 2013-10-07 LAB — PHOSPHORUS: Phosphorus: 3.6 mg/dL (ref 2.3–4.6)

## 2013-10-07 LAB — MAGNESIUM: MAGNESIUM: 1.2 mg/dL — AB (ref 1.5–2.5)

## 2013-10-07 LAB — PROTIME-INR
INR: 1.7 — ABNORMAL HIGH (ref 0.00–1.49)
Prothrombin Time: 14.7 seconds (ref 11.6–15.2)

## 2013-10-07 LAB — APTT: APTT: 34 s (ref 24–37)

## 2013-10-07 MED ORDER — MAGNESIUM SULFATE 40 MG/ML IJ SOLN
2.0000 g | Freq: Once | INTRAMUSCULAR | Status: AC
  Administered 2013-10-07: 2 g via INTRAVENOUS
  Filled 2013-10-07: qty 50

## 2013-10-07 MED ORDER — IBUPROFEN 600 MG PO TABS
600.0000 mg | ORAL_TABLET | Freq: Once | ORAL | Status: DC
  Filled 2013-10-07: qty 1

## 2013-10-07 MED ORDER — THIAMINE HCL 100 MG/ML IJ SOLN
100.0000 mg | Freq: Every day | INTRAMUSCULAR | Status: DC
  Administered 2013-10-07 – 2013-10-13 (×7): 100 mg via INTRAVENOUS
  Filled 2013-10-07 (×8): qty 1

## 2013-10-07 MED ORDER — FOLIC ACID 5 MG/ML IJ SOLN
1.0000 mg | Freq: Every day | INTRAMUSCULAR | Status: DC
  Administered 2013-10-07 – 2013-10-13 (×7): 1 mg via INTRAVENOUS
  Filled 2013-10-07 (×8): qty 0.2

## 2013-10-07 MED ORDER — HALOPERIDOL LACTATE 5 MG/ML IJ SOLN
2.0000 mg | Freq: Four times a day (QID) | INTRAMUSCULAR | Status: DC | PRN
  Administered 2013-10-07 – 2013-10-08 (×4): 5 mg via INTRAVENOUS
  Filled 2013-10-07 (×4): qty 1

## 2013-10-07 MED ORDER — SODIUM CHLORIDE 0.9 % IV BOLUS (SEPSIS)
500.0000 mL | Freq: Once | INTRAVENOUS | Status: AC
  Administered 2013-10-07: 500 mL via INTRAVENOUS

## 2013-10-07 MED ORDER — SODIUM CHLORIDE 0.9 % IV SOLN
1.0000 mg | Freq: Once | INTRAVENOUS | Status: DC
  Filled 2013-10-07: qty 0.2

## 2013-10-07 NOTE — Progress Notes (Signed)
Utilization review completed.  

## 2013-10-07 NOTE — Consult Note (Signed)
[facility] Face-to-Face Psychiatry Consult   Reason for Consult:  Alcohol intoxication and suicide attempt Referring Physician:  Dr. [name] is an 44 y.o. male. Total Time spent with patient: 45 minutes  Assessment: AXIS I:  Substance Induced Mood Disorder and Alcohol intoxication and withdrawal symptoms AXIS II:  Deferred AXIS III:   Past Medical History  Diagnosis Date  . Hypertension   . Asthma   . Pancreatitis   . Cocaine abuse   . Depression   . H/O suicide attempt   . Heart murmur     "when he was little" (03/06/2013)  . Shortness of breath     "can happen at anytime" (03/06/2013)  . Anemia   . H/O hiatal hernia   . GERD (gastroesophageal reflux disease)   . Seizures     "weekly" (03/06/2013)  . Arthritis     "knees" (03/06/2013)  . Anxiety   . WPW (Wolff-Parkinson-White syndrome)     [name] 03/06/2013   AXIS IV:  other psychosocial or environmental problems, problems related to social environment and problems with primary support group AXIS V:  41-50 serious symptoms  Plan:  Recommend psychiatric Inpatient admission when medically cleared. Supportive therapy provided about ongoing stressors. Continuous safety sitter and CIWA protocol and continue current medication management  Subjective:   [name] is a 43 y.o. male patient admitted with suicide attempt and alcohol intoxication.  HPI:  Patient was seen and chart reviewed. Psychiatric consultation was requested a suicide attempt while intoxicated. This is a 43 yo male with history of alcohol abuse versus dependence, elevated liver enzymes, depression/anxiety comes in after intentionally ingesting some of his home meds to kill himself per intial report he told the emergency department on arrival. Patient was disheveled, poorly groomed and denies current symptoms of depression, anxiety and psychosis. Patient was placed in the 4 point restraints as he is being combative and removing catheters and  intravenous tubes. He tells me he did this to try to get high off of the flexeril. He says he took 15 pills, combination of some of his flexeril and some of his clonidine. His girlfriend is pregnant and is planning on getting an abortion tomorrow, and they were argueing about that, and he got very upset and took a bunch of his pills. This occurred at about 10pm. He received charcoal in the emergency department. He also reports he has been drinking beer.   Review of Systems:  unobtainable  HPI Elements:   Location:  Depression and alcohol abuse. Quality:  poor. Severity:  Acute. Timing:  Suicide attempt and intoxication.  Past Psychiatric History: Past Medical History  Diagnosis Date  . Hypertension   . Asthma   . Pancreatitis   . Cocaine abuse   . Depression   . H/O suicide attempt   . Heart murmur     "when he was little" (03/06/2013)  . Shortness of breath     "can happen at anytime" (03/06/2013)  . Anemia   . H/O hiatal hernia   . GERD (gastroesophageal reflux disease)   . Seizures     "weekly" (03/06/2013)  . Arthritis     "knees" (03/06/2013)  . Anxiety   . WPW (Wolff-Parkinson-White syndrome)     [name] 03/06/2013    reports that he has been smoking Cigarettes.  He has a 30 pack-year smoking history. He uses smokeless tobacco. He reports that he drinks about 25.2 ounces of alcohol per week. He reports that he uses illicit  drugs (Cocaine and Marijuana). Family History  Problem Relation Age of Onset  . Hypertension Other   . Coronary artery disease Other            Allergies:   Allergies  Allergen Reactions  . Trazodone And Nefazodone Other (See Comments)    Muscle spasms  . Shellfish-Derived Products Nausea And Vomiting    ACT Assessment Complete:  No Objective: Blood pressure 92/50, pulse 90, temperature 98.9 F (37.2 C), temperature source Axillary, resp. rate 26, height _0  (1.753 m), weight 59.2 kg (130 lb 8.2 oz), SpO2 91.00%.Body mass index is 19.26  kg/(m^2). Results for orders placed during the hospital encounter of 10/05/13 (from the past 72 hour(s))  GLUCOSE, CAPILLARY     Status: Abnormal   Collection Time    10/05/13 10:56 PM      Result Value Ref Range   Glucose-Capillary 138 (*) 70 - 99 mg/dL  CBC     Status: Abnormal   Collection Time    10/05/13 11:10 PM      Result Value Ref Range   WBC 7.6  4.0 - 10.5 K/uL   RBC 2.01 (*) 4.22 - 5.81 MIL/uL   Hemoglobin 7.2 (*) 13.0 - 17.0 g/dL   HCT 20.4 (*) 39.0 - 52.0 %   MCV 101.5 (*) 78.0 - 100.0 fL   MCH 35.8 (*) 26.0 - 34.0 pg   MCHC 35.3  30.0 - 36.0 g/dL   RDW 17.8 (*) 11.5 - 15.5 %   Platelets 91 (*) 150 - 400 K/uL   Comment: REPEATED TO VERIFY     PLATELET COUNT CONFIRMED BY SMEAR  COMPREHENSIVE METABOLIC PANEL     Status: Abnormal   Collection Time    10/05/13 11:10 PM      Result Value Ref Range   Sodium 133 (*) 137 - 147 mEq/L   Potassium 4.2  3.7 - 5.3 mEq/L   Comment: HEMOLYSIS AT THIS LEVEL MAY AFFECT RESULT   Chloride 99  96 - 112 mEq/L   CO2 20  19 - 32 mEq/L   Glucose, Bld 142 (*) 70 - 99 mg/dL   BUN 8  6 - 23 mg/dL   Creatinine, Ser 0.73  0.50 - 1.35 mg/dL   Calcium 7.2 (*) 8.4 - 10.5 mg/dL   Total Protein 5.7 (*) 6.0 - 8.3 g/dL   Albumin 1.7 (*) 3.5 - 5.2 g/dL   AST 268 (*) 0 - 37 U/L   ALT 102 (*) 0 - 53 U/L   Alkaline Phosphatase 393 (*) 39 - 117 U/L   Total Bilirubin 2.1 (*) 0.3 - 1.2 mg/dL   GFR calc non Af Amer >90  >90 mL/min   GFR calc Af Amer >90  >90 mL/min   Comment: (NOTE)     The eGFR has been calculated using the CKD EPI equation.     This calculation has not been validated in all clinical situations.     eGFR's persistently <90 mL/min signify possible Chronic Kidney     Disease.  ETHANOL     Status: Abnormal   Collection Time    10/05/13 11:10 PM      Result Value Ref Range   Alcohol, Ethyl (B) 298 (*) 0 - 11 mg/dL   Comment:            LOWEST DETECTABLE LIMIT FOR     SERUM ALCOHOL IS 11 mg/dL     FOR MEDICAL PURPOSES ONLY   ACETAMINOPHEN LEVEL  Status: None   Collection Time    10/05/13 11:10 PM      Result Value Ref Range   Acetaminophen (Tylenol), Serum <15.0  10 - 30 ug/mL   Comment:            THERAPEUTIC CONCENTRATIONS VARY     SIGNIFICANTLY. A RANGE OF 10-30     ug/mL MAY BE AN EFFECTIVE     CONCENTRATION FOR MANY PATIENTS.     HOWEVER, SOME ARE BEST TREATED     AT CONCENTRATIONS OUTSIDE THIS     RANGE.     ACETAMINOPHEN CONCENTRATIONS     >150 ug/mL AT 4 HOURS AFTER     INGESTION AND >50 ug/mL AT 12     HOURS AFTER INGESTION ARE     OFTEN ASSOCIATED WITH TOXIC     REACTIONS.  SALICYLATE LEVEL     Status: Abnormal   Collection Time    10/05/13 11:10 PM      Result Value Ref Range   Salicylate Lvl <3.8 (*) 2.8 - 20.0 mg/dL  MAGNESIUM     Status: None   Collection Time    10/05/13 11:10 PM      Result Value Ref Range   Magnesium 1.5  1.5 - 2.5 mg/dL  I-STAT CG4 LACTIC ACID, ED     Status: Abnormal   Collection Time    10/05/13 11:25 PM      Result Value Ref Range   Lactic Acid, Venous 3.64 (*) 0.5 - 2.2 mmol/L  I-STAT VENOUS BLOOD GAS, ED     Status: Abnormal   Collection Time    10/05/13 11:49 PM      Result Value Ref Range   pH, Ven 7.358 (*) 7.250 - 7.300   pCO2, Ven 33.6 (*) 45.0 - 50.0 mmHg   pO2, Ven 58.0 (*) 30.0 - 45.0 mmHg   Bicarbonate 19.0 (*) 20.0 - 24.0 mEq/L   TCO2 20  0 - 100 mmol/L   O2 Saturation 89.0     Acid-base deficit 6.0 (*) 0.0 - 2.0 mmol/L   Patient temperature 97.8 F     Sample type VENOUS    POC OCCULT BLOOD, ED     Status: None   Collection Time    10/06/13 12:22 AM      Result Value Ref Range   Fecal Occult Bld NEGATIVE  NEGATIVE  I-STAT CG4 LACTIC ACID, ED     Status: Abnormal   Collection Time    10/06/13  2:14 AM      Result Value Ref Range   Lactic Acid, Venous 3.54 (*) 0.5 - 2.2 mmol/L  HEMOGLOBIN AND HEMATOCRIT, BLOOD     Status: Abnormal   Collection Time    10/06/13  3:05 AM      Result Value Ref Range   Hemoglobin 7.3 (*) 13.0 -  17.0 g/dL   HCT 21.0 (*) 39.0 - 52.0 %  PROTIME-INR     Status: Abnormal   Collection Time    10/06/13  3:05 AM      Result Value Ref Range   Prothrombin Time 17.1 (*) 11.6 - 15.2 seconds   INR 1.43  0.00 - 1.49  LACTIC ACID, PLASMA     Status: Abnormal   Collection Time    10/06/13  3:06 AM      Result Value Ref Range   Lactic Acid, Venous 3.6 (*) 0.5 - 2.2 mmol/L  HEPATITIS PANEL, ACUTE     Status: None  Collection Time    10/06/13  6:00 AM      Result Value Ref Range   Hepatitis B Surface Ag NEGATIVE  NEGATIVE   HCV Ab NEGATIVE  NEGATIVE   Hep A IgM NON REACTIVE  NON REACTIVE   Hep B C IgM NON REACTIVE  NON REACTIVE   Comment: (NOTE)     High levels of Hepatitis B Core IgM antibody are detectable     during the acute stage of Hepatitis B. This antibody is used     to differentiate current from past HBV infection.     Performed at [facility]  VITAMIN B12     Status: Abnormal   Collection Time    10/06/13  6:00 AM      Result Value Ref Range   Vitamin B-12 1251 (*) 211 - 911 pg/mL   Comment: Performed at [place]     Status: None   Collection Time    10/06/13  6:00 AM      Result Value Ref Range   Folate >20.0     Comment: (NOTE)     Reference Ranges            Deficient:       0.4 - 3.3 ng/mL            Indeterminate:   3.4 - 5.4 ng/mL            Normal:              > 5.4 ng/mL     Performed at [facility]     Status: Abnormal   Collection Time    10/06/13  6:00 AM      Result Value Ref Range   Iron 121  42 - 135 ug/dL   TIBC NOT CALC  215 - 435 ug/dL   Comment: TIBC and %SAT were not calculated due to the UIBC being <15.   Saturation Ratios NOT CALC  20 - 55 %   Comment: TIBC and %SAT were not calculated due to the UIBC being <15.   UIBC <15 (*) 125 - 400 ug/dL   Comment: Performed at [facility]     Status: Abnormal   Collection Time    10/06/13  6:00 AM      Result Value Ref Range    Ferritin 1370 (*) 22 - 322 ng/mL   Comment: Performed at [place]     Status: Abnormal   Collection Time    10/06/13  6:00 AM      Result Value Ref Range   Retic Ct Pct 3.5 (*) 0.4 - 3.1 %   RBC. 2.01 (*) 4.22 - 5.81 MIL/uL   Retic Count, Manual 70.4  19.0 - 186.0 K/uL  PROTIME-INR     Status: Abnormal   Collection Time    10/06/13  6:00 AM      Result Value Ref Range   Prothrombin Time 18.0 (*) 11.6 - 15.2 seconds   INR 1.53 (*) 0.00 - 1.49  COMPREHENSIVE METABOLIC PANEL     Status: Abnormal   Collection Time    10/06/13  6:00 AM      Result Value Ref Range   Sodium 133 (*) 137 - 147 mEq/L   Potassium 4.2  3.7 - 5.3 mEq/L   Chloride 101  96 - 112 mEq/L   CO2 14 (*) 19 - 32 mEq/L  Glucose, Bld 137 (*) 70 - 99 mg/dL   BUN 7  6 - 23 mg/dL   Creatinine, Ser 0.64  0.50 - 1.35 mg/dL   Calcium 7.3 (*) 8.4 - 10.5 mg/dL   Total Protein 5.7 (*) 6.0 - 8.3 g/dL   Albumin 1.6 (*) 3.5 - 5.2 g/dL   AST 261 (*) 0 - 37 U/L   ALT 111 (*) 0 - 53 U/L   Alkaline Phosphatase 404 (*) 39 - 117 U/L   Total Bilirubin 2.0 (*) 0.3 - 1.2 mg/dL   GFR calc non Af Amer >90  >90 mL/min   GFR calc Af Amer >90  >90 mL/min   Comment: (NOTE)     The eGFR has been calculated using the CKD EPI equation.     This calculation has not been validated in all clinical situations.     eGFR's persistently <90 mL/min signify possible Chronic Kidney     Disease.  CBC     Status: Abnormal   Collection Time    10/06/13  6:00 AM      Result Value Ref Range   WBC 7.4  4.0 - 10.5 K/uL   RBC 2.01 (*) 4.22 - 5.81 MIL/uL   Hemoglobin 7.1 (*) 13.0 - 17.0 g/dL   HCT 20.4 (*) 39.0 - 52.0 %   MCV 101.5 (*) 78.0 - 100.0 fL   MCH 35.3 (*) 26.0 - 34.0 pg   MCHC 34.8  30.0 - 36.0 g/dL   RDW 18.1 (*) 11.5 - 15.5 %   Platelets 91 (*) 150 - 400 K/uL   Comment: CONSISTENT WITH PREVIOUS RESULT  TYPE AND SCREEN     Status: None   Collection Time    10/06/13  6:00 AM      Result Value Ref Range    ABO/RH(D) B POS     Antibody Screen NEG     Sample Expiration 10/09/2013    ABO/RH     Status: None   Collection Time    10/06/13  6:00 AM      Result Value Ref Range   ABO/RH(D) B POS    URINE RAPID DRUG SCREEN (HOSP PERFORMED)     Status: None   Collection Time    10/06/13  6:20 AM      Result Value Ref Range   Opiates NONE DETECTED  NONE DETECTED   Cocaine NONE DETECTED  NONE DETECTED   Benzodiazepines NONE DETECTED  NONE DETECTED   Amphetamines NONE DETECTED  NONE DETECTED   Tetrahydrocannabinol NONE DETECTED  NONE DETECTED   Barbiturates NONE DETECTED  NONE DETECTED   Comment:            DRUG SCREEN FOR MEDICAL PURPOSES     ONLY.  IF CONFIRMATION IS NEEDED     FOR ANY PURPOSE, NOTIFY LAB     WITHIN 5 DAYS.                LOWEST DETECTABLE LIMITS     FOR URINE DRUG SCREEN     Drug Class       Cutoff (ng/mL)     Amphetamine      1000     Barbiturate      200     Benzodiazepine   161     Tricyclics       096     Opiates          300     Cocaine  300     THC              50  URINALYSIS, ROUTINE W REFLEX MICROSCOPIC     Status: None   Collection Time    10/06/13  6:23 AM      Result Value Ref Range   Color, Urine YELLOW  YELLOW   APPearance CLEAR  CLEAR   Specific Gravity, Urine 1.018  1.005 - 1.030   pH 5.0  5.0 - 8.0   Glucose, UA NEGATIVE  NEGATIVE mg/dL   Hgb urine dipstick NEGATIVE  NEGATIVE   Bilirubin Urine NEGATIVE  NEGATIVE   Ketones, ur NEGATIVE  NEGATIVE mg/dL   Protein, ur NEGATIVE  NEGATIVE mg/dL   Urobilinogen, UA 1.0  0.0 - 1.0 mg/dL   Nitrite NEGATIVE  NEGATIVE   Leukocytes, UA NEGATIVE  NEGATIVE   Comment: MICROSCOPIC NOT DONE ON URINES WITH NEGATIVE PROTEIN, BLOOD, LEUKOCYTES, NITRITE, OR GLUCOSE <1000 mg/dL.  HEMOGLOBIN AND HEMATOCRIT, BLOOD     Status: Abnormal   Collection Time    10/06/13 10:00 AM      Result Value Ref Range   Hemoglobin 7.0 (*) 13.0 - 17.0 g/dL   HCT 20.1 (*) 39.0 - 52.0 %  MRSA PCR SCREENING     Status: None    Collection Time    10/07/13  8:26 AM      Result Value Ref Range   MRSA by PCR NEGATIVE  NEGATIVE   Comment:            The GeneXpert MRSA Assay (FDA     approved for NASAL specimens     only), is one component of a     comprehensive MRSA colonization     surveillance program. It is not     intended to diagnose MRSA     infection nor to guide or     monitor treatment for     MRSA infections.  LACTIC ACID, PLASMA     Status: Abnormal   Collection Time    10/07/13 10:35 AM      Result Value Ref Range   Lactic Acid, Venous 3.4 (*) 0.5 - 2.2 mmol/L  COMPREHENSIVE METABOLIC PANEL     Status: Abnormal   Collection Time    10/07/13 10:37 AM      Result Value Ref Range   Sodium 134 (*) 137 - 147 mEq/L   Potassium 4.0  3.7 - 5.3 mEq/L   Chloride 98  96 - 112 mEq/L   CO2 18 (*) 19 - 32 mEq/L   Glucose, Bld 76  70 - 99 mg/dL   BUN 4 (*) 6 - 23 mg/dL   Creatinine, Ser 0.66  0.50 - 1.35 mg/dL   Calcium 7.7 (*) 8.4 - 10.5 mg/dL   Total Protein 6.2  6.0 - 8.3 g/dL   Albumin 1.8 (*) 3.5 - 5.2 g/dL   AST 262 (*) 0 - 37 U/L   Comment: HEMOLYSIS AT THIS LEVEL MAY AFFECT RESULT   ALT 109 (*) 0 - 53 U/L   Alkaline Phosphatase 418 (*) 39 - 117 U/L   Total Bilirubin 3.5 (*) 0.3 - 1.2 mg/dL   GFR calc non Af Amer >90  >90 mL/min   GFR calc Af Amer >90  >90 mL/min   Comment: (NOTE)     The eGFR has been calculated using the CKD EPI equation.     This calculation has not been validated in all clinical situations.  eGFR's persistently <90 mL/min signify possible Chronic Kidney     Disease.  CBC     Status: Abnormal   Collection Time    10/07/13 10:37 AM      Result Value Ref Range   WBC 8.9  4.0 - 10.5 K/uL   RBC 2.41 (*) 4.22 - 5.81 MIL/uL   Hemoglobin 8.6 (*) 13.0 - 17.0 g/dL   Comment: REPEATED TO VERIFY   HCT 25.6 (*) 39.0 - 52.0 %   MCV 106.2 (*) 78.0 - 100.0 fL   MCH 35.7 (*) 26.0 - 34.0 pg   MCHC 33.6  30.0 - 36.0 g/dL   RDW 18.0 (*) 11.5 - 15.5 %   Platelets 70 (*) 150 - 400  K/uL   Comment: CONSISTENT WITH PREVIOUS RESULT  MAGNESIUM     Status: Abnormal   Collection Time    10/07/13 10:37 AM      Result Value Ref Range   Magnesium 1.2 (*) 1.5 - 2.5 mg/dL  PHOSPHORUS     Status: None   Collection Time    10/07/13 10:37 AM      Result Value Ref Range   Phosphorus 3.6  2.3 - 4.6 mg/dL  PROTIME-INR     Status: Abnormal   Collection Time    10/07/13 10:37 AM      Result Value Ref Range   Prothrombin Time 14.7  11.6 - 15.2 seconds   INR 1.70 (*) 0.00 - 1.49  APTT     Status: None   Collection Time    10/07/13 10:37 AM      Result Value Ref Range   aPTT 34  24 - 37 seconds   Labs are reviewed and are pertinent for alcohol intoxication.  Current Facility-Administered Medications  Medication Dose Route Frequency Provider Last Rate Last Dose  . 0.9 %  sodium chloride infusion   Intravenous Continuous Cherene Altes, MD 50 mL/hr at 10/06/13 1732    . albuterol (PROVENTIL) (2.5 MG/3ML) 0.083% nebulizer solution 2.5 mg  2.5 mg Inhalation Q6H PRN Cherene Altes, MD      . folic acid (FOLVITE) tablet 1 mg  1 mg Oral Daily Cherene Altes, MD   1 mg at 10/06/13 1732  . haloperidol lactate (HALDOL) injection 2-5 mg  2-5 mg Intravenous Q6H PRN Cherene Altes, MD   5 mg at 10/07/13 1356  . LORazepam (ATIVAN) injection 2-3 mg  2-3 mg Intravenous Q1H PRN Cherene Altes, MD   3 mg at 10/07/13 1058  . ondansetron (ZOFRAN) tablet 4 mg  4 mg Oral Q6H PRN Phillips Grout, MD       Or  . ondansetron [facility]) injection 4 mg  4 mg Intravenous Q6H PRN Phillips Grout, MD      . thiamine (VITAMIN B-1) tablet 100 mg  100 mg Oral Daily Cherene Altes, MD   100 mg at 10/06/13 1800    Psychiatric Specialty Exam: Physical Exam  ROS  Blood pressure 92/50, pulse 90, temperature 98.9 F (37.2 C), temperature source Axillary, resp. rate 26, height _0  (1.753 m), weight 59.2 kg (130 lb 8.2 oz), SpO2 91.00%.Body mass index is 19.26 kg/(m^2).  General Appearance:  Disheveled and Guarded  Eye Sport and exercise psychologist::  Fair  Speech:  Slurred  Volume:  Decreased  Mood:  Angry, Anxious and Depressed  Affect:  Depressed and Flat  Thought Process:  Loose  Orientation:  NA  Thought Content:  Rumination  Suicidal Thoughts:  Yes.  with intent/plan  Homicidal Thoughts:  No  Memory:  Immediate;   Fair  Judgement:  Poor  Insight:  Lacking  Psychomotor Activity:  Increased  Concentration:  Fair  Recall:  [place]: Fair  Akathisia:  NA  Handed:  Right  AIMS (if indicated):     Assets:  Communication Skills Desire for Improvement Financial Resources/Insurance Housing Intimacy Leisure Time Physical Health Resilience Social Support  Sleep:      Musculoskeletal: Strength & Muscle Tone: within normal limits Gait & Station: unable to stand Patient leans: N/A  Treatment Plan Summary: Daily contact with patient to assess and evaluate symptoms and progress in treatment Medication management  Durward Parcel 10/07/2013 4:17 PM

## 2013-10-07 NOTE — Progress Notes (Signed)
[place] TEAM 1 - Stepdown/ICU TEAM Progress Note  [name] PJK:[id number] DOB: [birth year] DOA: 10/05/2013 PCP: Angelica Chessman, MD  Admit HPI / Brief Narrative: 43 yo M w/ h/o etoh abuse, elevated lfts, depression/anxiety who came to the ED after intentionally ingesting some of his home meds to kill himself per his own report to the ED staff. He later changed his story stating he was trying to get high off the flexeril. He said he took 15 pills; a combination of flexeril and clonidine. He reported that his girlfriend is pregnant and is planning on getting an abortion, and they were argueing about that.  He said he got very upset and took a bunch of his pills. This occurred at about 10pm. He received charcoal in the ED.   HPI/Subjective: Pt has been very agitated, confused, and combative today.  He has required sedation per the CIWA protocol.    Assessment/Plan:  Suicidal ideation Psychiatry consult was requested this morning - cont suicide precautions - pt is NOT allowed to leave AMA unless cleared by Psych   Intentional prescription drug OD ASA and APAP levels undetectable - appears to be stabilizing from physiologic standpoint, with no clear persisting sequelae of his ingestion - cont to hydrate and follow BP   Lactic acidosis Persisting - increase volume resuscitation and follow   Hx of HTN BP low w/ OD on clonidine - hydrating and following - no severe bradycardia   EtOH abuse EtOH level 298 at presentation - appears to be experiencing EtOH withdrawal at this time - CIWA protocol   Transaminitis Elevated in pattern c/w EtOH damage - INR elevated w/o evidence of bleeding - not improving w/ simple hydration - viral hepatitis panel negative - will check [place] of liver once more calm to check architecture   Hypomagnesemia  Likely due to EtOH abuse and malnutrition - replace and follow  WPW? A question of possible WPW was apparently raised at some point during the admit  process - I do not note definitive delta waves on any of the EKGs obtained - will cont to monitor on tele and consider Cardiology comment if question remains   Macrocytic anemia Hgb 7.2 at presentation - Hgb stable/improving - guiac negative - likely due to malnutrition and toxic effects of chronic heavy EtOH abuse   Hx of cocaine abuse  UDS negative at admit   Code Status: FULL Family Communication: no family present at time of exam Disposition Plan: SDU  Consultants: Psychiatry   Procedures: none  Antibiotics: none  DVT prophylaxis: SCDs  Objective: Blood pressure 92/50, pulse 90, temperature 98.9 F (37.2 C), temperature source Axillary, resp. rate 26, height 5\' 9"  (1.753 m), weight 59.2 kg (130 lb 8.2 oz), SpO2 91.00%.  Intake/Output Summary (Last 24 hours) at 10/07/13 1621 Last data filed at 10/07/13 1500  Gross per 24 hour  Intake   1580 ml  Output    900 ml  Net    680 ml   Exam: General: No acute respiratory distress - pt is sedated on CIWA protocol  Lungs: Clear to auscultation bilaterally without wheezes or crackles Cardiovascular: Regular rate and rhythm without murmur gallop or rub normal S1 and S2 Abdomen: Nontender, nondistended, soft, bowel sounds positive, no rebound, no ascites, no appreciable mass Extremities: No significant cyanosis, clubbing, or edema bilateral lower extremities  Data Reviewed: Basic Metabolic Panel:  Recent Labs Lab 10/05/13 2310 10/06/13 0600 10/07/13 1037  NA 133* 133* 134*  K 4.2 4.2 4.0  CL 99 101 98  CO2 20 14* 18*  GLUCOSE 142* 137* 76  BUN 8 7 4*  CREATININE 0.73 0.64 0.66  CALCIUM 7.2* 7.3* 7.7*  MG 1.5  --  1.2*  PHOS  --   --  3.6   Liver Function Tests:  Recent Labs Lab 10/05/13 2310 10/06/13 0600 10/07/13 1037  AST 268* 261* 262*  ALT 102* 111* 109*  ALKPHOS 393* 404* 418*  BILITOT 2.1* 2.0* 3.5*  PROT 5.7* 5.7* 6.2  ALBUMIN 1.7* 1.6* 1.8*   CBC:  Recent Labs Lab 10/05/13 2310  10/06/13 0305 10/06/13 0600 10/06/13 1000 10/07/13 1037  WBC 7.6  --  7.4  --  8.9  HGB 7.2* 7.3* 7.1* 7.0* 8.6*  HCT 20.4* 21.0* 20.4* 20.1* 25.6*  MCV 101.5*  --  101.5*  --  106.2*  PLT 91*  --  91*  --  70*   CBG:  Recent Labs Lab 10/05/13 2256  GLUCAP 138*    Recent Results (from the past 240 hour(s))  MRSA PCR SCREENING     Status: None   Collection Time    10/07/13  8:26 AM      Result Value Ref Range Status   MRSA by PCR NEGATIVE  NEGATIVE Final   Comment:            The GeneXpert MRSA Assay (FDA     approved for NASAL specimens     only), is one component of a     comprehensive MRSA colonization     surveillance program. It is not     intended to diagnose MRSA     infection nor to guide or     monitor treatment for     MRSA infections.     Studies:  Recent x-ray studies have been reviewed in detail by the Attending Physician  Scheduled Meds:  Scheduled Meds: . folic acid  1 mg Oral Daily  . thiamine  100 mg Oral Daily    Time spent on care of this patient: 35 mins   Cherene Altes , MD   [facility] Office  [phone] Pager - Text Page per Amion as per below:  On-Call/Text Page:      [email or web address]      password [email or web address]  If 7PM-7AM, please contact night-coverage [email or web address] Password [email or web address] 10/07/2013, 4:21 PM   LOS: 2 days

## 2013-10-07 NOTE — Progress Notes (Addendum)
1630:  Pt becomes physically combative, verbally abusive, pulling at lines, getting out of bed, does not follow commands. Three staff members to get patient back to bed after medication administered; pt danger to self and staff. Placed in lap belt for safety. Reoriented. Pt continues to pull at lines and pulling/grabbing staff members. Bilateral wrist restraints applied. 1:1 sitter at bedside. Pt currently sleeping. Will attempt to release wrist restraints this evening, if pt cooperative. Will continue to monitor.  Verbal order for restraints given by Dr. Thereasa Solo who was at bedside and on unit. Co-sign required.  M. Raeford Razor, RN reordered non-violent restraints at [phone]; initiated at 51. Placed order incorrectly. Will continue to monitor.  Stacie Acres, RN to assume care.

## 2013-10-07 NOTE — Progress Notes (Addendum)
Physician notified: Thereasa Solo At: 1105  Regarding: Pt requiring 3mg  ativan q1hour. Physically combative. Flight risk. Possibly needs lap restraint. Thanks.   Orders placed in CPOE.

## 2013-10-08 ENCOUNTER — Inpatient Hospital Stay (HOSPITAL_COMMUNITY): Payer: Self-pay

## 2013-10-08 DIAGNOSIS — I456 Pre-excitation syndrome: Secondary | ICD-10-CM

## 2013-10-08 DIAGNOSIS — G9340 Encephalopathy, unspecified: Secondary | ICD-10-CM

## 2013-10-08 DIAGNOSIS — T50901A Poisoning by unspecified drugs, medicaments and biological substances, accidental (unintentional), initial encounter: Secondary | ICD-10-CM

## 2013-10-08 DIAGNOSIS — I10 Essential (primary) hypertension: Secondary | ICD-10-CM

## 2013-10-08 DIAGNOSIS — F1010 Alcohol abuse, uncomplicated: Secondary | ICD-10-CM

## 2013-10-08 DIAGNOSIS — F10939 Alcohol use, unspecified with withdrawal, unspecified: Secondary | ICD-10-CM

## 2013-10-08 DIAGNOSIS — X838XXA Intentional self-harm by other specified means, initial encounter: Secondary | ICD-10-CM

## 2013-10-08 DIAGNOSIS — D539 Nutritional anemia, unspecified: Secondary | ICD-10-CM

## 2013-10-08 DIAGNOSIS — F329 Major depressive disorder, single episode, unspecified: Secondary | ICD-10-CM

## 2013-10-08 DIAGNOSIS — F1994 Other psychoactive substance use, unspecified with psychoactive substance-induced mood disorder: Secondary | ICD-10-CM

## 2013-10-08 DIAGNOSIS — F1411 Cocaine abuse, in remission: Secondary | ICD-10-CM

## 2013-10-08 DIAGNOSIS — R7402 Elevation of levels of lactic acid dehydrogenase (LDH): Secondary | ICD-10-CM

## 2013-10-08 DIAGNOSIS — T1491XA Suicide attempt, initial encounter: Secondary | ICD-10-CM | POA: Diagnosis present

## 2013-10-08 DIAGNOSIS — F3289 Other specified depressive episodes: Secondary | ICD-10-CM

## 2013-10-08 DIAGNOSIS — F10239 Alcohol dependence with withdrawal, unspecified: Secondary | ICD-10-CM

## 2013-10-08 DIAGNOSIS — F17200 Nicotine dependence, unspecified, uncomplicated: Secondary | ICD-10-CM

## 2013-10-08 DIAGNOSIS — R740 Nonspecific elevation of levels of transaminase and lactic acid dehydrogenase [LDH]: Secondary | ICD-10-CM

## 2013-10-08 LAB — GLUCOSE, CAPILLARY: GLUCOSE-CAPILLARY: 115 mg/dL — AB (ref 70–99)

## 2013-10-08 LAB — PROTIME-INR
INR: 1.21 (ref 0.00–1.49)
Prothrombin Time: 15 seconds (ref 11.6–15.2)

## 2013-10-08 LAB — COMPREHENSIVE METABOLIC PANEL
ALT: 87 U/L — AB (ref 0–53)
ALT: 82 U/L — AB (ref 0–53)
AST: 219 U/L — AB (ref 0–37)
AST: 194 U/L — AB (ref 0–37)
Albumin: 1.6 g/dL — ABNORMAL LOW (ref 3.5–5.2)
Albumin: 1.4 g/dL — ABNORMAL LOW (ref 3.5–5.2)
Alkaline Phosphatase: 359 U/L — ABNORMAL HIGH (ref 39–117)
Alkaline Phosphatase: 337 U/L — ABNORMAL HIGH (ref 39–117)
BILIRUBIN TOTAL: 3.3 mg/dL — AB (ref 0.3–1.2)
BUN: 3 mg/dL — ABNORMAL LOW (ref 6–23)
BUN: 3 mg/dL — ABNORMAL LOW (ref 6–23)
CALCIUM: 7.2 mg/dL — AB (ref 8.4–10.5)
CALCIUM: 7.2 mg/dL — AB (ref 8.4–10.5)
CHLORIDE: 103 meq/L (ref 96–112)
CO2: 21 mEq/L (ref 19–32)
CO2: 19 mEq/L (ref 19–32)
Chloride: 103 mEq/L (ref 96–112)
Creatinine, Ser: 0.77 mg/dL (ref 0.50–1.35)
Creatinine, Ser: 0.61 mg/dL (ref 0.50–1.35)
GFR calc Af Amer: >90 mL/min (ref >90)
GFR calc non Af Amer: >90 mL/min (ref >90)
GFR calc non Af Amer: >90 mL/min (ref >90)
GLUCOSE: 82 mg/dL (ref 70–99)
Glucose, Bld: 135 mg/dL — ABNORMAL HIGH (ref 70–99)
Potassium: 3.9 mEq/L (ref 3.7–5.3)
Potassium: 3.6 mEq/L — ABNORMAL LOW (ref 3.7–5.3)
SODIUM: 134 meq/L — AB (ref 137–147)
Sodium: 135 mEq/L — ABNORMAL LOW (ref 137–147)
TOTAL PROTEIN: 5.8 g/dL — AB (ref 6.0–8.3)
Total Bilirubin: 3.4 mg/dL — ABNORMAL HIGH (ref 0.3–1.2)
Total Protein: 5.5 g/dL — ABNORMAL LOW (ref 6.0–8.3)

## 2013-10-08 LAB — CBC
HCT: 24.7 % — ABNORMAL LOW (ref 39.0–52.0)
HEMATOCRIT: 21.9 % — AB (ref 39.0–52.0)
HEMOGLOBIN: 8.1 g/dL — AB (ref 13.0–17.0)
Hemoglobin: 7.1 g/dL — ABNORMAL LOW (ref 13.0–17.0)
MCH: 34.5 pg — ABNORMAL HIGH (ref 26.0–34.0)
MCH: 35.1 pg — AB (ref 26.0–34.0)
MCHC: 32.4 g/dL (ref 30.0–36.0)
MCHC: 32.8 g/dL (ref 30.0–36.0)
MCV: 106.3 fL — ABNORMAL HIGH (ref 78.0–100.0)
MCV: 106.9 fL — ABNORMAL HIGH (ref 78.0–100.0)
Platelets: 61 10*3/uL — ABNORMAL LOW (ref 150–400)
Platelets: 72 10*3/uL — ABNORMAL LOW (ref 150–400)
RBC: 2.06 MIL/uL — AB (ref 4.22–5.81)
RBC: 2.31 MIL/uL — AB (ref 4.22–5.81)
RDW: 18.1 % — ABNORMAL HIGH (ref 11.5–15.5)
RDW: 18.2 % — ABNORMAL HIGH (ref 11.5–15.5)
WBC: 8.8 10*3/uL (ref 4.0–10.5)
WBC: 10.4 10*3/uL (ref 4.0–10.5)

## 2013-10-08 LAB — BASIC METABOLIC PANEL
BUN: <3 mg/dL — ABNORMAL LOW (ref 6–23)
CHLORIDE: 103 meq/L (ref 96–112)
CO2: 19 meq/L (ref 19–32)
CREATININE: 0.52 mg/dL (ref 0.50–1.35)
Calcium: 7.3 mg/dL — ABNORMAL LOW (ref 8.4–10.5)
GFR calc Af Amer: >90 mL/min (ref >90)
GFR calc non Af Amer: >90 mL/min (ref >90)
Glucose, Bld: 96 mg/dL (ref 70–99)
Potassium: 3.5 mEq/L — ABNORMAL LOW (ref 3.7–5.3)
Sodium: 134 mEq/L — ABNORMAL LOW (ref 137–147)

## 2013-10-08 LAB — MAGNESIUM
MAGNESIUM: 2.1 mg/dL (ref 1.5–2.5)
Magnesium: 1.9 mg/dL (ref 1.5–2.5)

## 2013-10-08 LAB — OCCULT BLOOD X 1 CARD TO LAB, STOOL
FECAL OCCULT BLD: NEGATIVE
Fecal Occult Bld: NEGATIVE

## 2013-10-08 LAB — LACTIC ACID, PLASMA
LACTIC ACID, VENOUS: 1.5 mmol/L (ref 0.5–2.2)
Lactic Acid, Venous: 0.8 mmol/L (ref 0.5–2.2)

## 2013-10-08 LAB — TROPONIN I: Troponin I: <0.3 ng/mL (ref <0.30)

## 2013-10-08 LAB — HEMOGLOBIN A1C
HEMOGLOBIN A1C: 4.5 % (ref <5.7)
Mean Plasma Glucose: 82 mg/dL (ref <117)

## 2013-10-08 MED ORDER — DEXMEDETOMIDINE HCL IN NACL 400 MCG/100ML IV SOLN
0.4000 ug/kg/h | INTRAVENOUS | Status: DC
  Filled 2013-10-08: qty 100

## 2013-10-08 MED ORDER — PANTOPRAZOLE SODIUM 40 MG IV SOLR
40.0000 mg | INTRAVENOUS | Status: DC
  Administered 2013-10-08 – 2013-10-13 (×6): 40 mg via INTRAVENOUS
  Filled 2013-10-08 (×9): qty 40

## 2013-10-08 MED ORDER — DEXMEDETOMIDINE HCL IN NACL 200 MCG/50ML IV SOLN: 0.2000 ug/kg/h | INTRAVENOUS | Status: DC

## 2013-10-08 MED ORDER — MIDAZOLAM HCL 2 MG/2ML IJ SOLN
INTRAMUSCULAR | Status: AC
  Administered 2013-10-08: 2 mg via INTRAVENOUS
  Filled 2013-10-08: qty 2

## 2013-10-08 MED ORDER — DEXMEDETOMIDINE HCL IN NACL 200 MCG/50ML IV SOLN
0.4000 ug/kg/h | INTRAVENOUS | Status: DC
  Filled 2013-10-08: qty 50

## 2013-10-08 MED ORDER — DEXMEDETOMIDINE HCL IN NACL 400 MCG/100ML IV SOLN
0.2000 ug/kg/h | INTRAVENOUS | Status: DC
  Administered 2013-10-08 – 2013-10-09 (×3): 0.7 ug/kg/h via INTRAVENOUS
  Administered 2013-10-09 – 2013-10-13 (×15): 1.2 ug/kg/h via INTRAVENOUS
  Filled 2013-10-08 (×10): qty 100
  Filled 2013-10-08: qty 200
  Filled 2013-10-08: qty 100
  Filled 2013-10-08: qty 200
  Filled 2013-10-08 (×4): qty 100

## 2013-10-08 MED ORDER — QUETIAPINE FUMARATE ER 50 MG PO TB24
100.0000 mg | ORAL_TABLET | Freq: Every day | ORAL | Status: DC
  Filled 2013-10-08: qty 2

## 2013-10-08 MED ORDER — HALOPERIDOL LACTATE 5 MG/ML IJ SOLN
INTRAMUSCULAR | Status: AC
  Administered 2013-10-08: 5 mg
  Filled 2013-10-08: qty 1

## 2013-10-08 MED ORDER — MIDAZOLAM HCL 2 MG/2ML IJ SOLN
2.0000 mg | Freq: Once | INTRAMUSCULAR | Status: AC
  Administered 2013-10-08: 2 mg via INTRAVENOUS

## 2013-10-08 MED ORDER — HALOPERIDOL LACTATE 5 MG/ML IJ SOLN: 2.0000 mg | Freq: Four times a day (QID) | INTRAMUSCULAR | Status: DC | PRN

## 2013-10-08 MED ORDER — FENTANYL CITRATE 0.05 MG/ML IJ SOLN
25.0000 ug | INTRAMUSCULAR | Status: DC | PRN
  Administered 2013-10-08 – 2013-10-13 (×7): 25 ug via INTRAVENOUS
  Filled 2013-10-08 (×8): qty 2

## 2013-10-08 MED ORDER — MIDAZOLAM HCL 2 MG/2ML IJ SOLN
1.0000 mg | INTRAMUSCULAR | Status: DC | PRN
  Administered 2013-10-09: 1 mg via INTRAVENOUS
  Administered 2013-10-09: 2 mg via INTRAVENOUS
  Administered 2013-10-09: 1 mg via INTRAVENOUS
  Administered 2013-10-09 (×2): 2 mg via INTRAVENOUS
  Administered 2013-10-09: 1 mg via INTRAVENOUS
  Filled 2013-10-08 (×5): qty 2

## 2013-10-08 MED ORDER — ADULT MULTIVITAMIN W/MINERALS CH
1.0000 | ORAL_TABLET | Freq: Every day | ORAL | Status: DC
  Administered 2013-10-13 – 2013-10-22 (×11): 1 via ORAL
  Filled 2013-10-08 (×15): qty 1

## 2013-10-08 NOTE — Consult Note (Signed)
[facility] Face-to-Face Psychiatry Consult   Reason for Consult:  Alcohol intoxication and suicide attempt Referring Physician:  Dr. [name] is an 43 y.o. male. Total Time spent with patient: 45 minutes  Assessment: AXIS I:  Substance Induced Mood Disorder and Alcohol intoxication and withdrawal symptoms AXIS II:  Deferred AXIS III:   Past Medical History  Diagnosis Date  . Hypertension   . Asthma   . Pancreatitis   . Cocaine abuse   . Depression   . H/O suicide attempt   . Heart murmur     "when he was little" (03/06/2013)  . Shortness of breath     "can happen at anytime" (03/06/2013)  . Anemia   . H/O hiatal hernia   . GERD (gastroesophageal reflux disease)   . Seizures     "weekly" (03/06/2013)  . Arthritis     "knees" (03/06/2013)  . Anxiety   . WPW (Wolff-Parkinson-White syndrome)     [name] 03/06/2013   AXIS IV:  other psychosocial or environmental problems, problems related to social environment and problems with primary support group AXIS V:  41-50 serious symptoms  Plan:  Spoke with the staff RN and page Dr. Thereasa Solo Recommend Seroquel XR 100 mg at bedtime for agitation, trazodone 100 mg at bedtime and also recommends step down ativan CIWA protocol Recommend psychiatric Inpatient admission when medically cleared. Supportive therapy provided about ongoing stressors. Continuous safety sitter and CIWA protocol and continue current medication management  Subjective:   [name] is a 43 y.o. male patient admitted with suicide attempt and alcohol intoxication.  HPI:  Patient was seen and chart reviewed. Psychiatric consultation was requested a suicide attempt while intoxicated. This is a 43 yo male with history of alcohol abuse versus dependence, elevated liver enzymes, depression/anxiety comes in after intentionally ingesting some of his home meds to kill himself per intial report he told the emergency department on arrival. Patient was disheveled,  poorly groomed and denies current symptoms of depression, anxiety and psychosis. Patient was placed in the 4 point restraints as he is being combative and removing catheters and intravenous tubes. He tells me he did this to try to get high off of the flexeril. He says he took 15 pills, combination of some of his flexeril and some of his clonidine. His girlfriend is pregnant and is planning on getting an abortion tomorrow, and they were argueing about that, and he got very upset and took a bunch of his pills. This occurred at about 10pm. He received charcoal in the emergency department. He also reports he has been drinking beer.   Review of Systems:  Unobtainable  Interval history: The patient continued to be combative, irritable, upset and required restraints physically and chemically. Patient has limited insight and judgment about his current clinical condition and asking to send home. Patient reported he was intoxicated and then overdosed medication to end his life because of conflicts with his girlfriend who lives in [place] and with his brother at home. 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 Patient minimized his alcohol drinking saying he drinks only 4 beers a day. His blood alcohol level in the emergency department his 298. Patient has a history of alcohol detox treatment 2012 at [facility] which was [facility].   HPI Elements:   Location:  Depression and alcohol abuse. Quality:  poor. Severity:  Acute. Timing:  Suicide attempt and intoxication.  Past Psychiatric History: Past  Medical History  Diagnosis Date  . Hypertension   . Asthma   . Pancreatitis   . Cocaine abuse   . Depression   . H/O suicide attempt   . Heart murmur     "when he was little" (03/06/2013)  . Shortness of breath     "can happen at anytime" (03/06/2013)  . Anemia   . H/O hiatal hernia   . GERD (gastroesophageal reflux disease)   . Seizures     "weekly"  (03/06/2013)  . Arthritis     "knees" (03/06/2013)  . Anxiety   . WPW (Wolff-Parkinson-White syndrome)     [name] 03/06/2013    reports that he has been smoking Cigarettes.  He has a 30 pack-year smoking history. He uses smokeless tobacco. He reports that he drinks about 25.2 ounces of alcohol per week. He reports that he uses illicit drugs (Cocaine and Marijuana). Family History  Problem Relation Age of Onset  . Hypertension Other   . Coronary artery disease Other            Allergies:   Allergies  Allergen Reactions  . Trazodone And Nefazodone Other (See Comments)    Muscle spasms  . Shellfish-Derived Products Nausea And Vomiting    ACT Assessment Complete:  No Objective: Blood pressure 101/67, pulse 122, temperature 99 F (37.2 C), temperature source Oral, resp. rate 26, height _0  (1.753 m), weight 59.2 kg (130 lb 8.2 oz), SpO2 97.00%.Body mass index is 19.26 kg/(m^2). Results for orders placed during the hospital encounter of 10/05/13 (from the past 72 hour(s))  GLUCOSE, CAPILLARY     Status: Abnormal   Collection Time    10/05/13 10:56 PM      Result Value Ref Range   Glucose-Capillary 138 (*) 70 - 99 mg/dL  CBC     Status: Abnormal   Collection Time    10/05/13 11:10 PM      Result Value Ref Range   WBC 7.6  4.0 - 10.5 K/uL   RBC 2.01 (*) 4.22 - 5.81 MIL/uL   Hemoglobin 7.2 (*) 13.0 - 17.0 g/dL   HCT 20.4 (*) 39.0 - 52.0 %   MCV 101.5 (*) 78.0 - 100.0 fL   MCH 35.8 (*) 26.0 - 34.0 pg   MCHC 35.3  30.0 - 36.0 g/dL   RDW 17.8 (*) 11.5 - 15.5 %   Platelets 91 (*) 150 - 400 K/uL   Comment: REPEATED TO VERIFY     PLATELET COUNT CONFIRMED BY SMEAR  COMPREHENSIVE METABOLIC PANEL     Status: Abnormal   Collection Time    10/05/13 11:10 PM      Result Value Ref Range   Sodium 133 (*) 137 - 147 mEq/L   Potassium 4.2  3.7 - 5.3 mEq/L   Comment: HEMOLYSIS AT THIS LEVEL MAY AFFECT RESULT   Chloride 99  96 - 112 mEq/L   CO2 20  19 - 32 mEq/L   Glucose, Bld 142  (*) 70 - 99 mg/dL   BUN 8  6 - 23 mg/dL   Creatinine, Ser 0.73  0.50 - 1.35 mg/dL   Calcium 7.2 (*) 8.4 - 10.5 mg/dL   Total Protein 5.7 (*) 6.0 - 8.3 g/dL   Albumin 1.7 (*) 3.5 - 5.2 g/dL   AST 268 (*) 0 - 37 U/L   ALT 102 (*) 0 - 53 U/L   Alkaline Phosphatase 393 (*) 39 - 117 U/L   Total Bilirubin 2.1 (*) 0.3 - 1.2  mg/dL   GFR calc non Af Amer >90  >90 mL/min   GFR calc Af Amer >90  >90 mL/min   Comment: (NOTE)     The eGFR has been calculated using the CKD EPI equation.     This calculation has not been validated in all clinical situations.     eGFR's persistently <90 mL/min signify possible Chronic Kidney     Disease.  ETHANOL     Status: Abnormal   Collection Time    10/05/13 11:10 PM      Result Value Ref Range   Alcohol, Ethyl (B) 298 (*) 0 - 11 mg/dL   Comment:            LOWEST DETECTABLE LIMIT FOR     SERUM ALCOHOL IS 11 mg/dL     FOR MEDICAL PURPOSES ONLY  ACETAMINOPHEN LEVEL     Status: None   Collection Time    10/05/13 11:10 PM      Result Value Ref Range   Acetaminophen (Tylenol), Serum <15.0  10 - 30 ug/mL   Comment:            THERAPEUTIC CONCENTRATIONS VARY     SIGNIFICANTLY. A RANGE OF 10-30     ug/mL MAY BE AN EFFECTIVE     CONCENTRATION FOR MANY PATIENTS.     HOWEVER, SOME ARE BEST TREATED     AT CONCENTRATIONS OUTSIDE THIS     RANGE.     ACETAMINOPHEN CONCENTRATIONS     >150 ug/mL AT 4 HOURS AFTER     INGESTION AND >50 ug/mL AT 12     HOURS AFTER INGESTION ARE     OFTEN ASSOCIATED WITH TOXIC     REACTIONS.  SALICYLATE LEVEL     Status: Abnormal   Collection Time    10/05/13 11:10 PM      Result Value Ref Range   Salicylate Lvl <7.6 (*) 2.8 - 20.0 mg/dL  MAGNESIUM     Status: None   Collection Time    10/05/13 11:10 PM      Result Value Ref Range   Magnesium 1.5  1.5 - 2.5 mg/dL  I-STAT CG4 LACTIC ACID, ED     Status: Abnormal   Collection Time    10/05/13 11:25 PM      Result Value Ref Range   Lactic Acid, Venous 3.64 (*) 0.5 - 2.2  mmol/L  I-STAT VENOUS BLOOD GAS, ED     Status: Abnormal   Collection Time    10/05/13 11:49 PM      Result Value Ref Range   pH, Ven 7.358 (*) 7.250 - 7.300   pCO2, Ven 33.6 (*) 45.0 - 50.0 mmHg   pO2, Ven 58.0 (*) 30.0 - 45.0 mmHg   Bicarbonate 19.0 (*) 20.0 - 24.0 mEq/L   TCO2 20  0 - 100 mmol/L   O2 Saturation 89.0     Acid-base deficit 6.0 (*) 0.0 - 2.0 mmol/L   Patient temperature 97.8 F     Sample type VENOUS    POC OCCULT BLOOD, ED     Status: None   Collection Time    10/06/13 12:22 AM      Result Value Ref Range   Fecal Occult Bld NEGATIVE  NEGATIVE  I-STAT CG4 LACTIC ACID, ED     Status: Abnormal   Collection Time    10/06/13  2:14 AM      Result Value Ref Range   Lactic Acid, Venous 3.54 (*) 0.5 -  2.2 mmol/L  HEMOGLOBIN AND HEMATOCRIT, BLOOD     Status: Abnormal   Collection Time    10/06/13  3:05 AM      Result Value Ref Range   Hemoglobin 7.3 (*) 13.0 - 17.0 g/dL   HCT 21.0 (*) 39.0 - 52.0 %  PROTIME-INR     Status: Abnormal   Collection Time    10/06/13  3:05 AM      Result Value Ref Range   Prothrombin Time 17.1 (*) 11.6 - 15.2 seconds   INR 1.43  0.00 - 1.49  LACTIC ACID, PLASMA     Status: Abnormal   Collection Time    10/06/13  3:06 AM      Result Value Ref Range   Lactic Acid, Venous 3.6 (*) 0.5 - 2.2 mmol/L  HEPATITIS PANEL, ACUTE     Status: None   Collection Time    10/06/13  6:00 AM      Result Value Ref Range   Hepatitis B Surface Ag NEGATIVE  NEGATIVE   HCV Ab NEGATIVE  NEGATIVE   Hep A IgM NON REACTIVE  NON REACTIVE   Hep B C IgM NON REACTIVE  NON REACTIVE   Comment: (NOTE)     High levels of Hepatitis B Core IgM antibody are detectable     during the acute stage of Hepatitis B. This antibody is used     to differentiate current from past HBV infection.     Performed at [facility]  VITAMIN B12     Status: Abnormal   Collection Time    10/06/13  6:00 AM      Result Value Ref Range   Vitamin B-12 1251 (*) 211 - 911 pg/mL    Comment: Performed at [place]     Status: None   Collection Time    10/06/13  6:00 AM      Result Value Ref Range   Folate >20.0     Comment: (NOTE)     Reference Ranges            Deficient:       0.4 - 3.3 ng/mL            Indeterminate:   3.4 - 5.4 ng/mL            Normal:              > 5.4 ng/mL     Performed at [place] TIBC     Status: Abnormal   Collection Time    10/06/13  6:00 AM      Result Value Ref Range   Iron 121  42 - 135 ug/dL   TIBC NOT CALC  215 - 435 ug/dL   Comment: TIBC and %SAT were not calculated due to the UIBC being <15.   Saturation Ratios NOT CALC  20 - 55 %   Comment: TIBC and %SAT were not calculated due to the UIBC being <15.   UIBC <15 (*) 125 - 400 ug/dL   Comment: Performed at [place]     Status: Abnormal   Collection Time    10/06/13  6:00 AM      Result Value Ref Range   Ferritin 1370 (*) 22 - 322 ng/mL   Comment: Performed at [place]     Status: Abnormal   Collection Time    10/06/13  6:00 AM  Result Value Ref Range   Retic Ct Pct 3.5 (*) 0.4 - 3.1 %   RBC. 2.01 (*) 4.22 - 5.81 MIL/uL   Retic Count, Manual 70.4  19.0 - 186.0 K/uL  PROTIME-INR     Status: Abnormal   Collection Time    10/06/13  6:00 AM      Result Value Ref Range   Prothrombin Time 18.0 (*) 11.6 - 15.2 seconds   INR 1.53 (*) 0.00 - 1.49  COMPREHENSIVE METABOLIC PANEL     Status: Abnormal   Collection Time    10/06/13  6:00 AM      Result Value Ref Range   Sodium 133 (*) 137 - 147 mEq/L   Potassium 4.2  3.7 - 5.3 mEq/L   Chloride 101  96 - 112 mEq/L   CO2 14 (*) 19 - 32 mEq/L   Glucose, Bld 137 (*) 70 - 99 mg/dL   BUN 7  6 - 23 mg/dL   Creatinine, Ser 0.64  0.50 - 1.35 mg/dL   Calcium 7.3 (*) 8.4 - 10.5 mg/dL   Total Protein 5.7 (*) 6.0 - 8.3 g/dL   Albumin 1.6 (*) 3.5 - 5.2 g/dL   AST 261 (*) 0 - 37 U/L   ALT 111 (*) 0 - 53 U/L   Alkaline Phosphatase 404 (*) 39  - 117 U/L   Total Bilirubin 2.0 (*) 0.3 - 1.2 mg/dL   GFR calc non Af Amer >90  >90 mL/min   GFR calc Af Amer >90  >90 mL/min   Comment: (NOTE)     The eGFR has been calculated using the CKD EPI equation.     This calculation has not been validated in all clinical situations.     eGFR's persistently <90 mL/min signify possible Chronic Kidney     Disease.  CBC     Status: Abnormal   Collection Time    10/06/13  6:00 AM      Result Value Ref Range   WBC 7.4  4.0 - 10.5 K/uL   RBC 2.01 (*) 4.22 - 5.81 MIL/uL   Hemoglobin 7.1 (*) 13.0 - 17.0 g/dL   HCT 20.4 (*) 39.0 - 52.0 %   MCV 101.5 (*) 78.0 - 100.0 fL   MCH 35.3 (*) 26.0 - 34.0 pg   MCHC 34.8  30.0 - 36.0 g/dL   RDW 18.1 (*) 11.5 - 15.5 %   Platelets 91 (*) 150 - 400 K/uL   Comment: CONSISTENT WITH PREVIOUS RESULT  TYPE AND SCREEN     Status: None   Collection Time    10/06/13  6:00 AM      Result Value Ref Range   ABO/RH(D) B POS     Antibody Screen NEG     Sample Expiration 10/09/2013    ABO/RH     Status: None   Collection Time    10/06/13  6:00 AM      Result Value Ref Range   ABO/RH(D) B POS    URINE RAPID DRUG SCREEN (HOSP PERFORMED)     Status: None   Collection Time    10/06/13  6:20 AM      Result Value Ref Range   Opiates NONE DETECTED  NONE DETECTED   Cocaine NONE DETECTED  NONE DETECTED   Benzodiazepines NONE DETECTED  NONE DETECTED   Amphetamines NONE DETECTED  NONE DETECTED   Tetrahydrocannabinol NONE DETECTED  NONE DETECTED   Barbiturates NONE DETECTED  NONE DETECTED   Comment:  DRUG SCREEN FOR MEDICAL PURPOSES     ONLY.  IF CONFIRMATION IS NEEDED     FOR ANY PURPOSE, NOTIFY LAB     WITHIN 5 DAYS.                LOWEST DETECTABLE LIMITS     FOR URINE DRUG SCREEN     Drug Class       Cutoff (ng/mL)     Amphetamine      1000     Barbiturate      200     Benzodiazepine   700     Tricyclics       174     Opiates          300     Cocaine          300     THC              50   URINALYSIS, ROUTINE W REFLEX MICROSCOPIC     Status: None   Collection Time    10/06/13  6:23 AM      Result Value Ref Range   Color, Urine YELLOW  YELLOW   APPearance CLEAR  CLEAR   Specific Gravity, Urine 1.018  1.005 - 1.030   pH 5.0  5.0 - 8.0   Glucose, UA NEGATIVE  NEGATIVE mg/dL   Hgb urine dipstick NEGATIVE  NEGATIVE   Bilirubin Urine NEGATIVE  NEGATIVE   Ketones, ur NEGATIVE  NEGATIVE mg/dL   Protein, ur NEGATIVE  NEGATIVE mg/dL   Urobilinogen, UA 1.0  0.0 - 1.0 mg/dL   Nitrite NEGATIVE  NEGATIVE   Leukocytes, UA NEGATIVE  NEGATIVE   Comment: MICROSCOPIC NOT DONE ON URINES WITH NEGATIVE PROTEIN, BLOOD, LEUKOCYTES, NITRITE, OR GLUCOSE <1000 mg/dL.  HEMOGLOBIN AND HEMATOCRIT, BLOOD     Status: Abnormal   Collection Time    10/06/13 10:00 AM      Result Value Ref Range   Hemoglobin 7.0 (*) 13.0 - 17.0 g/dL   HCT 20.1 (*) 39.0 - 52.0 %  MRSA PCR SCREENING     Status: None   Collection Time    10/07/13  8:26 AM      Result Value Ref Range   MRSA by PCR NEGATIVE  NEGATIVE   Comment:            The GeneXpert MRSA Assay (FDA     approved for NASAL specimens     only), is one component of a     comprehensive MRSA colonization     surveillance program. It is not     intended to diagnose MRSA     infection nor to guide or     monitor treatment for     MRSA infections.  LACTIC ACID, PLASMA     Status: Abnormal   Collection Time    10/07/13 10:35 AM      Result Value Ref Range   Lactic Acid, Venous 3.4 (*) 0.5 - 2.2 mmol/L  COMPREHENSIVE METABOLIC PANEL     Status: Abnormal   Collection Time    10/07/13 10:37 AM      Result Value Ref Range   Sodium 134 (*) 137 - 147 mEq/L   Potassium 4.0  3.7 - 5.3 mEq/L   Chloride 98  96 - 112 mEq/L   CO2 18 (*) 19 - 32 mEq/L   Glucose, Bld 76  70 - 99 mg/dL   BUN 4 (*) 6 - 23 mg/dL  Creatinine, Ser 0.66  0.50 - 1.35 mg/dL   Calcium 7.7 (*) 8.4 - 10.5 mg/dL   Total Protein 6.2  6.0 - 8.3 g/dL   Albumin 1.8 (*) 3.5 - 5.2 g/dL    AST 262 (*) 0 - 37 U/L   Comment: HEMOLYSIS AT THIS LEVEL MAY AFFECT RESULT   ALT 109 (*) 0 - 53 U/L   Alkaline Phosphatase 418 (*) 39 - 117 U/L   Total Bilirubin 3.5 (*) 0.3 - 1.2 mg/dL   GFR calc non Af Amer >90  >90 mL/min   GFR calc Af Amer >90  >90 mL/min   Comment: (NOTE)     The eGFR has been calculated using the CKD EPI equation.     This calculation has not been validated in all clinical situations.     eGFR's persistently <90 mL/min signify possible Chronic Kidney     Disease.  CBC     Status: Abnormal   Collection Time    10/07/13 10:37 AM      Result Value Ref Range   WBC 8.9  4.0 - 10.5 K/uL   RBC 2.41 (*) 4.22 - 5.81 MIL/uL   Hemoglobin 8.6 (*) 13.0 - 17.0 g/dL   Comment: REPEATED TO VERIFY   HCT 25.6 (*) 39.0 - 52.0 %   MCV 106.2 (*) 78.0 - 100.0 fL   MCH 35.7 (*) 26.0 - 34.0 pg   MCHC 33.6  30.0 - 36.0 g/dL   RDW 18.0 (*) 11.5 - 15.5 %   Platelets 70 (*) 150 - 400 K/uL   Comment: CONSISTENT WITH PREVIOUS RESULT  MAGNESIUM     Status: Abnormal   Collection Time    10/07/13 10:37 AM      Result Value Ref Range   Magnesium 1.2 (*) 1.5 - 2.5 mg/dL  PHOSPHORUS     Status: None   Collection Time    10/07/13 10:37 AM      Result Value Ref Range   Phosphorus 3.6  2.3 - 4.6 mg/dL  HEMOGLOBIN A1C     Status: None   Collection Time    10/07/13 10:37 AM      Result Value Ref Range   Hemoglobin A1C 4.5  <5.7 %   Comment: (NOTE)                                                                               According to the ADA Clinical Practice Recommendations for 2011, when     HbA1c is used as a screening test:      >=6.5%   Diagnostic of Diabetes Mellitus               (if abnormal result is confirmed)     5.7-6.4%   Increased risk of developing Diabetes Mellitus     References:Diagnosis and Classification of Diabetes Mellitus,Diabetes     JIRC,7893,81(OFBPZ 1):S62-S69 and Standards of Medical Care in             Diabetes - 2011,Diabetes Care,2011,34 (Suppl  1):S11-S61.   Mean Plasma Glucose 82  <117 mg/dL   Comment: Performed at [place]     Status: Abnormal   Collection  Time    10/07/13 10:37 AM      Result Value Ref Range   Prothrombin Time 14.7  11.6 - 15.2 seconds   INR 1.70 (*) 0.00 - 1.49  APTT     Status: None   Collection Time    10/07/13 10:37 AM      Result Value Ref Range   aPTT 34  24 - 37 seconds  OCCULT BLOOD X 1 CARD TO LAB, STOOL     Status: None   Collection Time    10/08/13 12:29 AM      Result Value Ref Range   Fecal Occult Bld NEGATIVE  NEGATIVE  MAGNESIUM     Status: None   Collection Time    10/08/13  1:32 AM      Result Value Ref Range   Magnesium 2.1  1.5 - 2.5 mg/dL  COMPREHENSIVE METABOLIC PANEL     Status: Abnormal   Collection Time    10/08/13  1:32 AM      Result Value Ref Range   Sodium 135 (*) 137 - 147 mEq/L   Potassium 3.9  3.7 - 5.3 mEq/L   Chloride 103  96 - 112 mEq/L   CO2 21  19 - 32 mEq/L   Glucose, Bld 82  70 - 99 mg/dL   BUN 3 (*) 6 - 23 mg/dL   Creatinine, Ser 0.77  0.50 - 1.35 mg/dL   Calcium 7.2 (*) 8.4 - 10.5 mg/dL   Total Protein 5.8 (*) 6.0 - 8.3 g/dL   Albumin 1.6 (*) 3.5 - 5.2 g/dL   AST 219 (*) 0 - 37 U/L   ALT 87 (*) 0 - 53 U/L   Alkaline Phosphatase 359 (*) 39 - 117 U/L   Total Bilirubin 3.4 (*) 0.3 - 1.2 mg/dL   GFR calc non Af Amer >90  >90 mL/min   GFR calc Af Amer >90  >90 mL/min   Comment: (NOTE)     The eGFR has been calculated using the CKD EPI equation.     This calculation has not been validated in all clinical situations.     eGFR's persistently <90 mL/min signify possible Chronic Kidney     Disease.  CBC     Status: Abnormal   Collection Time    10/08/13  1:32 AM      Result Value Ref Range   WBC 8.8  4.0 - 10.5 K/uL   RBC 2.06 (*) 4.22 - 5.81 MIL/uL   Hemoglobin 7.1 (*) 13.0 - 17.0 g/dL   Comment: REPEATED TO VERIFY   HCT 21.9 (*) 39.0 - 52.0 %   MCV 106.3 (*) 78.0 - 100.0 fL   MCH 34.5 (*) 26.0 - 34.0 pg   MCHC 32.4  30.0  - 36.0 g/dL   RDW 18.1 (*) 11.5 - 15.5 %   Platelets 61 (*) 150 - 400 K/uL   Comment: CONSISTENT WITH PREVIOUS RESULT  PROTIME-INR     Status: None   Collection Time    10/08/13  1:32 AM      Result Value Ref Range   Prothrombin Time 15.0  11.6 - 15.2 seconds   INR 1.21  0.00 - 1.49  LACTIC ACID, PLASMA     Status: None   Collection Time    10/08/13  1:33 AM      Result Value Ref Range   Lactic Acid, Venous 0.8  0.5 - 2.2 mmol/L  OCCULT BLOOD X 1 CARD TO LAB, STOOL  Status: None   Collection Time    10/08/13  6:32 AM      Result Value Ref Range   Fecal Occult Bld NEGATIVE  NEGATIVE  CBC     Status: Abnormal   Collection Time    10/08/13  7:31 AM      Result Value Ref Range   WBC 10.4  4.0 - 10.5 K/uL   RBC 2.31 (*) 4.22 - 5.81 MIL/uL   Hemoglobin 8.1 (*) 13.0 - 17.0 g/dL   HCT 24.7 (*) 39.0 - 52.0 %   MCV 106.9 (*) 78.0 - 100.0 fL   MCH 35.1 (*) 26.0 - 34.0 pg   MCHC 32.8  30.0 - 36.0 g/dL   RDW 18.2 (*) 11.5 - 15.5 %   Platelets 72 (*) 150 - 400 K/uL   Comment: SPECIMEN CHECKED FOR CLOTS     REPEATED TO VERIFY     CONSISTENT WITH PREVIOUS RESULT  COMPREHENSIVE METABOLIC PANEL     Status: Abnormal   Collection Time    10/08/13 10:10 AM      Result Value Ref Range   Sodium 134 (*) 137 - 147 mEq/L   Potassium 3.6 (*) 3.7 - 5.3 mEq/L   Chloride 103  96 - 112 mEq/L   CO2 19  19 - 32 mEq/L   Glucose, Bld 135 (*) 70 - 99 mg/dL   BUN 3 (*) 6 - 23 mg/dL   Creatinine, Ser 0.61  0.50 - 1.35 mg/dL   Calcium 7.2 (*) 8.4 - 10.5 mg/dL   Total Protein 5.5 (*) 6.0 - 8.3 g/dL   Albumin 1.4 (*) 3.5 - 5.2 g/dL   AST 194 (*) 0 - 37 U/L   ALT 82 (*) 0 - 53 U/L   Alkaline Phosphatase 337 (*) 39 - 117 U/L   Total Bilirubin 3.3 (*) 0.3 - 1.2 mg/dL   GFR calc non Af Amer >90  >90 mL/min   GFR calc Af Amer >90  >90 mL/min   Comment: (NOTE)     The eGFR has been calculated using the CKD EPI equation.     This calculation has not been validated in all clinical situations.     eGFR's  persistently <90 mL/min signify possible Chronic Kidney     Disease.  MAGNESIUM     Status: None   Collection Time    10/08/13 10:10 AM      Result Value Ref Range   Magnesium 1.9  1.5 - 2.5 mg/dL   Labs are reviewed and are pertinent for alcohol intoxication.  Current Facility-Administered Medications  Medication Dose Route Frequency Provider Last Rate Last Dose  . 0.9 %  sodium chloride infusion   Intravenous Continuous Allie Bossier, MD 125 mL/hr at 10/08/13 1038    . albuterol (PROVENTIL) (2.5 MG/3ML) 0.083% nebulizer solution 2.5 mg  2.5 mg Inhalation Q6H PRN Cherene Altes, MD      . folic acid injection 1 mg  1 mg Intravenous Daily Cherene Altes, MD   1 mg at 10/08/13 1016  . haloperidol lactate (HALDOL) injection 2-5 mg  2-5 mg Intravenous Q6H PRN Cherene Altes, MD   5 mg at 10/08/13 1502  . LORazepam (ATIVAN) injection 2-3 mg  2-3 mg Intravenous Q1H PRN Cherene Altes, MD   3 mg at 10/08/13 1651  . ondansetron (ZOFRAN) tablet 4 mg  4 mg Oral Q6H PRN Phillips Grout, MD       Or  . ondansetron (  ZOFRAN) injection 4 mg  4 mg Intravenous Q6H PRN Phillips Grout, MD      . thiamine (B-1) injection 100 mg  100 mg Intravenous Daily Cherene Altes, MD   100 mg at 10/08/13 1016    Psychiatric Specialty Exam: Physical Exam  ROS  Blood pressure 101/67, pulse 122, temperature 99 F (37.2 C), temperature source Oral, resp. rate 26, height _0  (1.753 m), weight 59.2 kg (130 lb 8.2 oz), SpO2 97.00%.Body mass index is 19.26 kg/(m^2).  General Appearance: Disheveled and Guarded  Eye Sport and exercise psychologist::  Fair  Speech:  Slurred  Volume:  Decreased  Mood:  Angry, Anxious and Depressed  Affect:  Depressed and Flat  Thought Process:  Loose  Orientation:  NA  Thought Content:  Rumination  Suicidal Thoughts:  Yes.  with intent/plan  Homicidal Thoughts:  No  Memory:  Immediate;   Fair  Judgement:  Poor  Insight:  Lacking  Psychomotor Activity:  Increased  Concentration:  Fair   Recall:  [place]: Fair  Akathisia:  NA  Handed:  Right  AIMS (if indicated):     Assets:  Communication Skills Desire for Improvement Financial Resources/Insurance Housing Intimacy Leisure Time Physical Health Resilience Social Support  Sleep:      Musculoskeletal: Strength & Muscle Tone: within normal limits Gait & Station: unable to stand Patient leans: N/A  Treatment Plan Summary: Daily contact with patient to assess and evaluate symptoms and progress in treatment Medication management  Durward Parcel 10/08/2013 5:07 PM

## 2013-10-08 NOTE — Progress Notes (Signed)
Attempted to call report. Will call again.   1850: Report called to RN. Transferred by NT and RN via bed. eICU and CMT notified of transfer.

## 2013-10-08 NOTE — Clinical Social Work Psych Assess (Addendum)
Clinical Social Work Department CLINICAL SOCIAL WORK PSYCHIATRY SERVICE LINE ASSESSMENT 10/08/2013  Patient:  [name]  Account:  [id number]  [place] Date:  10/05/2013  Clinical Social Worker:  Wylene Men  Date/Time:  10/08/2013 01:26 PM Referred by:  RN  Date referred:  10/08/2013 Reason for Referral  Psychosocial assessment  Crisis Intervention  Other - See comment   Presenting Symptoms/Problems (In the person's/family's own words):   Psych CSW was consulted due to suicide attempt via OD.   Abuse/Neglect/Trauma History (check all that apply)  Denies history   Abuse/Neglect/Trauma Comments:   pt disoriented to situation and place at this time. Pt exhibiting aggitation and agression, pulling lines out.  Pt currently in wrist restraints and continues to have sitter at bedside.  Collateral information was received from pt mother.   Psychiatric History (check all that apply)  Outpatient treatment   Psychiatric medications:  QUEtiapine Fumarate (SEROQUEL XR) 150 MG 24 hr tablet  Take 150 mg by mouth at bedtime.      Yes  Historical Provider, MD    cloNIDine (CATAPRES) 0.1 MG tablet  Take 0.1 mg by mouth 2 (two) times daily.      Yes  Historical Provider, MD    cyclobenzaprine (FLEXERIL) 10 MG tablet  Take 1 tablet (10 mg total) by mouth 3 (three) times daily as needed for muscle spasms.  09/13/13   Current Mental Health Hospitalizations/Previous Mental Health History:   Pt mother reports pt receiving MH tx at [facility] for anxiety and depression.   Current provider:   PCP- [place] and Date:   ongoing   Current Medications:   acetaminophen (TYLENOL) 500 MG tablet  Take 1,000 mg by mouth every 6 (six) hours as needed for mild pain. Yes  Historical Provider, MD  albuterol (PROVENTIL HFA;VENTOLIN HFA) 108 (90 BASE) MCG/ACT inhaler  Inhale 1-2 puffs into the lungs every 6 (six) hours as needed for wheezing or shortness of breath. Yes   Historical Provider, MD  cloNIDine (CATAPRES) 0.1 MG tablet  Take 0.1 mg by mouth 2 (two) times daily.      Yes  Historical Provider, MD  cyclobenzaprine (FLEXERIL) 10 MG tablet  Take 1 tablet (10 mg total) by mouth 3 (three) times daily as needed for muscle spasms.  09/13/13    Yes  Janice Norrie, MD  lansoprazole (PREVACID) 15 MG capsule  Take 15 mg by mouth 2 (two) times daily as needed (for reflux).      Yes Historical Provider, MD  naproxen (NAPROSYN) 375 MG tablet  Take 1 tablet (375 mg total) by mouth 2 (two) times daily.  09/13/13    Yes  Janice Norrie, MD  ondansetron (ZOFRAN ODT) 4 MG disintegrating tablet  Take 1 tablet (4 mg total) by mouth every 8 (eight) hours as needed for nausea or vomiting.  09/18/13    Yes  Garald Balding, NP  oxyCODONE-acetaminophen (PERCOCET/ROXICET) 5-325 MG per tablet  Take 1 tablet by mouth every 6 (six) hours as needed for severe pain.  09/18/13    Yes  Garald Balding, NP  QUEtiapine Fumarate (SEROQUEL XR) 150 MG 24 hr tablet  Take 150 mg by mouth at bedtime.      Yes  Historical Provider, MD   Previous Impatient Admission/Date/Reason:   none reported   Emotional Health / Current Symptoms    Suicide/Self Harm  Suicide attempt in past (date/description)   Suicide  attempt in the past:   Upon admission  attempted OD   Other harmful behavior:   use of illicit drugs/ETOH intoxication  none other reported   Psychotic/Dissociative Symptoms  Unable to accurately assess   Other Psychotic/Dissociative Symptoms:   pt currently exhibiting physical and verbal aggression and is in wrist restraints with a sitter at bedside.  Psych CSW unable to assess.    Attention/Behavioral Symptoms  Unable to accurately assess  Impulsive  Physicial aggression  Restless  Verbal aggression   Other Attention / Behavioral Symptoms:   none    Cognitive Impairment  Orientation - Self   Other Cognitive Impairment:   none exhibited  pt only oriented to self and place;  collateral information was obtained through mother    Mood and Adjustment  Aggressive/frustrated  Unable to accurately assess  Unstable/Inconsistent    Stress, Anxiety, Trauma, Any Recent Loss/Stressor  Anxiety  Relationship   Anxiety (frequency):   of report, pt has hx dx of anxiety and depression    unable to accurately assess due to pt being verbally and physically aggressive; severe aggitation   Phobia (specify):   unable to accurately assess due to pt being verbally and physically aggressive; severe aggitation   Compulsive behavior (specify):   unable to accurately assess due to pt being verbally and physically aggressive; severe aggitation   Obsessive behavior (specify):   unable to accurately assess due to pt being verbally and physically aggressive; severe aggitation   Other:   unable to accurately assess due to pt being verbally and physically aggressive; severe aggitation   Substance Abuse/Use  None   SBIRT completed (please refer for detailed history):  N  Self-reported substance use:   unable to accurately assess  pt mother denies pt SA UDS- reports no detections   Urinary Drug Screen Completed:  Y Alcohol level:   BAL 298 pt has hx of residential tx at what is now [facility] 2012    Environmental/Housing/Living Arrangement  With Family Member   Who is in the home:   Mother, brother and grandmother   Emergency contact:  mother, [name]    Patient's Strengths and Goals (patient's own words):   Pt is receiving care.  Pt has supportive mother and stable housing. Pt has access to both health care and mental healthcare.   Clinical Social Worker's Interpretive Summary:   Pt is oriented only to self and place at this time.  Pt exhibiting verbal and physical aggression and having to have wrists restrained due to pulling lines out.  Pt also has 1:1 sitter for suicide precautions.  Psych CSW was consulted for attempted suicide via OD.  Psych CSW contacted pt  mother, [name] for collateral information. Mother reports that pt had a dispute with a "lady friend" from [facility] who came down to speak to pt regarding her terminating her pregnancy where the pt was reportedly the father.  Once the "lady friend" left, mother reports the pt drank quite a bit and that is all she knew.  Of report and chart review, pt ingested multiple psychotrophic medications and EMS was called and pt taken to [facility] ED.    Mother reports no knowledge of pt SA.  Mother states that pt does drink "quite a bit" and usually weekly.  Mother does not feel that pt has an ETOH dependence or abuse.    Pt mother reports that pt receives mental healthcare from [facility].  pt mother states that pt has hx dx of anxiety and depression,  but is unaware of any other MH dx.  Of report and chart review, no other MH dx have been identified.    Psych CSW unable to assess for current SI/HI/AVHD.    Psychiatrist has evaluated the pt and recommended inpatient psychiatric hospitalization once the pt has been dc'd. Mother is aware and agreeable to plan of care.  Psych CSW will continue to follow.  Mother is very appreciative of CSW assistance and support and is very hopeful that pt will receive the help he needs once medically cleared.   Disposition:  Inpatient referral made [facility], [facility] [place]) [place], [place] [phone]  Clinical Social Work

## 2013-10-08 NOTE — Progress Notes (Signed)
1320: Attempted to release right wrist restraint to allow patient to feed self. Pt swung at sitter and kicked at NT. Verbally and physically abusive. Replaced right wrist restraint, gave 3mg  ativan IVP. Will continue to monitor, Sitter will attempt lunch at later time. Pt had to be fed breakfast.

## 2013-10-08 NOTE — Progress Notes (Signed)
[facility] TEAM 1 - Stepdown/ICU TEAM Progress Note  [name] IEP:[id number] DOB: 1971/01/17 DOA: 10/05/2013 PCP: Angelica Chessman, MD  Admit HPI / Brief Narrative: 43 year old BM PMHx polysubstance abuse (alcohol, cocaine), anxiety, depression, attempted suicide, seizures. Presented,ED after intentionally ingesting some of his home meds to kill himself per his own report to the ED staff. He later changed his story stating he was trying to get high off the flexeril. He said he took 15 pills; a combination of flexeril and clonidine. He reported that his girlfriend is pregnant and is planning on getting an abortion, and they were argueing about that. He said he got very upset and took a bunch of his pills. This occurred at about 10pm. He received charcoal in the ED.  Per psychiatry Limited insight and judgment about his current clinical condition. Reported he was intoxicated and then overdosed medication to end his life because of conflicts with his girlfriend who lives in [place] and with his brother at home. 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 Patient minimized his alcohol drinking saying he drinks only 4 beers a day. His blood alcohol level in the emergency department his 298. Patient has a history of alcohol detox treatment 2012 at [facility] which was now [facility].    HPI/Subjective: 6/2 off and on patient continues to be combative, irritable, upset and requires restraints physically and chemically. When initially observed this a.m. patient was resting comfortably.    Assessment/Plan:  Suicide attempt/drug overdose -Per Psychiatry when medically clear patient will be referred to inpatient facility  -Intentional prescription drug OD  -ASA and APAP levels undetectable    Lactic acidosis  -Resolved    Hx of HTN  -And patient allowed today to obtain BP, it was soft. Since then patient has been uncooperative and will not entertain a  good BP. Currently tachycardic and combative   EtOH abuse  -EtOH level 298 at presentation  - Experiencing EtOH withdrawal at this time, continue step down unit CIWA protocol. If patient does not improve will need to transfer to unit as she is close to maximum amount of medication allowed on the stepdown   Transaminitis  -Elevated in pattern c/w EtOH damage, but improving - INR within normal limits  - viral hepatitis panel negative  - will check [place] of liver once more calm    Hypomagnesemia  Likely due to EtOH abuse and malnutrition - replace and follow   WPW?  A question of possible WPW was apparently raised at some point during the admit process - I do not note definitive delta waves on any of the EKGs obtained - will cont to monitor on tele and consider Cardiology comment if question remains   Macrocytic anemia  Hgb 7.2 at presentation - Hgb stable/improving - guiac negative - likely due to malnutrition and toxic effects of chronic heavy EtOH abuse   Hx of cocaine abuse  UDS negative at admit    Code Status: FULL Family Communication: no family present at time of exam Disposition Plan: Resolution of alcohol withdrawal; involuntary commitment to inpatient behavioral health?    Consultants: Dr.Janardhaha Mickeal Skinner (psychiatry)   Procedure/Significant Events:    Culture NA  Antibiotics: NA  DVT prophylaxis: SCD   Devices NA  LINES / TUBES:  5/30 20ga left forearm   Continuous Infusions: . sodium chloride 125 mL/hr at 10/08/13 1038    Objective: VITAL SIGNS: Temp: 99 F (37.2 C) (06/02  1622) Temp src: Oral (06/02 1622) BP: 136/76 mmHg (06/02 1736) Pulse Rate: 144 (06/02 1721) SPO2; FIO2:   Intake/Output Summary (Last 24 hours) at 10/08/13 1751 Last data filed at 10/08/13 1622  Gross per 24 hour  Intake 2471.5 ml  Output   1775 ml  Net  696.5 ml     Exam: General: On initial exam patient sleepy, not cooperative later in the day became  combative and abusive with staff, No acute respiratory distress Lungs: Clear to auscultation bilaterally without wheezes or crackles Cardiovascular: Regular rate and rhythm without murmur gallop or rub normal S1 and S2 Abdomen: Nontender, nondistended, soft, bowel sounds positive, no rebound, no ascites, no appreciable mass Extremities: No significant cyanosis, clubbing, or edema bilateral lower extremities  Data Reviewed: Basic Metabolic Panel:  Recent Labs Lab 10/05/13 2310 10/06/13 0600 10/07/13 1037 10/08/13 0132 10/08/13 1010  NA 133* 133* 134* 135* 134*  K 4.2 4.2 4.0 3.9 3.6*  CL 99 101 98 103 103  CO2 20 14* 18* 21 19  GLUCOSE 142* 137* 76 82 135*  BUN 8 7 4* 3* 3*  CREATININE 0.73 0.64 0.66 0.77 0.61  CALCIUM 7.2* 7.3* 7.7* 7.2* 7.2*  MG 1.5  --  1.2* 2.1 1.9  PHOS  --   --  3.6  --   --    Liver Function Tests:  Recent Labs Lab 10/05/13 2310 10/06/13 0600 10/07/13 1037 10/08/13 0132 10/08/13 1010  AST 268* 261* 262* 219* 194*  ALT 102* 111* 109* 87* 82*  ALKPHOS 393* 404* 418* 359* 337*  BILITOT 2.1* 2.0* 3.5* 3.4* 3.3*  PROT 5.7* 5.7* 6.2 5.8* 5.5*  ALBUMIN 1.7* 1.6* 1.8* 1.6* 1.4*   No results found for this basename: LIPASE, AMYLASE,  in the last 168 hours No results found for this basename: AMMONIA,  in the last 168 hours CBC:  Recent Labs Lab 10/05/13 2310  10/06/13 0600 10/06/13 1000 10/07/13 1037 10/08/13 0132 10/08/13 0731  WBC 7.6  --  7.4  --  8.9 8.8 10.4  HGB 7.2*  < > 7.1* 7.0* 8.6* 7.1* 8.1*  HCT 20.4*  < > 20.4* 20.1* 25.6* 21.9* 24.7*  MCV 101.5*  --  101.5*  --  106.2* 106.3* 106.9*  PLT 91*  --  91*  --  70* 61* 72*  < > = values in this interval not displayed. Cardiac Enzymes: No results found for this basename: CKTOTAL, CKMB, CKMBINDEX, TROPONINI,  in the last 168 hours BNP (last 3 results) No results found for this basename: PROBNP,  in the last 8760 hours CBG:  Recent Labs Lab 10/05/13 2256  GLUCAP 138*    Recent  Results (from the past 240 hour(s))  MRSA PCR SCREENING     Status: None   Collection Time    10/07/13  8:26 AM      Result Value Ref Range Status   MRSA by PCR NEGATIVE  NEGATIVE Final   Comment:            The GeneXpert MRSA Assay (FDA     approved for NASAL specimens     only), is one component of a     comprehensive MRSA colonization     surveillance program. It is not     intended to diagnose MRSA     infection nor to guide or     monitor treatment for     MRSA infections.     Studies:  Recent x-ray studies have been reviewed in  detail by the Attending Physician  Scheduled Meds:  Scheduled Meds: . folic acid  1 mg Intravenous Daily  . QUEtiapine  100 mg Oral QHS  . thiamine IV  100 mg Intravenous Daily    Time spent on care of this patient: 40 mins   Allie Bossier , MD   [facility] Office  [phone] Pager [phone]  On-Call/Text Page:      [email or web address]      password [email or web address]  If 7PM-7AM, please contact night-coverage [email or web address] Password [email or web address] 10/08/2013, 5:51 PM   LOS: 3 days

## 2013-10-08 NOTE — Progress Notes (Signed)
Physician notified: Sherral Hammers At: 1721  Regarding: HR 140s RR 30s, gave all ativan and haldol I can. Climbing out of bed. CIWA 23. Please advise. CIWA now 50, needs ICU tx if >20.  Awaiting return response.   Returned Response at: 1715  Order(s): Haldol now 5mg . Placed on STEPDOWN CIWA, call back if no change- will consider ICU.

## 2013-10-08 NOTE — H&P (Signed)
PULMONARY / CRITICAL CARE MEDICINE  Name: [name] MRN: [id number] DOB: [birth year]    ADMISSION DATE:  10/05/2013 CONSULTATION DATE:    REFERRING MD :  [facility] PRIMARY SERVICE:  PCCM  CHIEF COMPLAINT:  Intentional Overdose  BRIEF PATIENT DESCRIPTION: 43 y.o. M brought to ED 5/31 after he intentionally overdosed on flexeril and clonodine as part of suicide attempt.  Was admitted by [facility]; however, became more agitated/combative.  On 6/2,  PCCM was consulted for transfer to ICU and initiation of precedex.  SIGNIFICANT EVENTS / STUDIES:  5/31 admitted.  Urine tox neg.  ETOH level 298. 6/2 transferred to ICU  LINES / TUBES: [place] 6/1 >>>  CULTURES: None  ANTIBIOTICS:  HISTORY OF PRESENT ILLNESS:  Pt is encephalopathic; therefore, this HPI is obtained from chart review. [name] is a 43 y.o. M with PMH including but not limited to polysubstance abuse (alcohol, cocaine), anxiety, depression, attempted suicide, seizures.  He presented to ED on 5/31 after intentionally ingesting some of his home meds in an attempt to kill himself per his own report to the ED staff. He later changed his story stating he was trying to get high off the flexeril by taking 15 pills; a combination of flexeril and clonidine. He reported that his girlfriend is pregnant and is planning on getting an abortion, and they were argueing about that. He was very upset so took a bunch of his pills. This occurred at about 10pm on day of admit. He received charcoal in the ED.  Per psychiatry Limited insight and judgment about his current clinical condition. Reported he was intoxicated and then overdosed medication to end his life because of conflicts with his girlfriend who lives in [place] and with his brother at home.He is alcoholic. He was initially admitted by [facility] team to the SDU; however, became combative/agitated/irritated requiring higher level of care due to him being a danger to himself and others.  Hence, PCCM was  consulted for transfer to ICU and initiation of precedex.   PAST MEDICAL HISTORY :  Past Medical History  Diagnosis Date  . Hypertension   . Asthma   . Pancreatitis   . Cocaine abuse   . Depression   . H/O suicide attempt   . Heart murmur     "when he was little" (03/06/2013)  . Shortness of breath     "can happen at anytime" (03/06/2013)  . Anemia   . H/O hiatal hernia   . GERD (gastroesophageal reflux disease)   . Seizures     "weekly" (03/06/2013)  . Arthritis     "knees" (03/06/2013)  . Anxiety   . WPW (Wolff-Parkinson-White syndrome)     [name] 03/06/2013   Past Surgical History  Procedure Laterality Date  . Facial fracture surgery Left 1990's    "result of trauma" (03/06/2013)  . Eye surgery Left 1990's    "result of trauma" (03/06/2013)   Prior to Admission medications   Medication Sig Start Date End Date Taking? Authorizing Provider  acetaminophen (TYLENOL) 500 MG tablet Take 1,000 mg by mouth every 6 (six) hours as needed for mild pain.    Yes Historical Provider, MD  albuterol (PROVENTIL HFA;VENTOLIN HFA) 108 (90 BASE) MCG/ACT inhaler Inhale 1-2 puffs into the lungs every 6 (six) hours as needed for wheezing or shortness of breath.   Yes Historical Provider, MD  cloNIDine (CATAPRES) 0.1 MG tablet Take 0.1 mg by mouth 2 (two) times daily.   Yes Historical Provider, MD  cyclobenzaprine (FLEXERIL)  10 MG tablet Take 1 tablet (10 mg total) by mouth 3 (three) times daily as needed for muscle spasms. 09/13/13  Yes Janice Norrie, MD  lansoprazole (PREVACID) 15 MG capsule Take 15 mg by mouth 2 (two) times daily as needed (for reflux).   Yes Historical Provider, MD  naproxen (NAPROSYN) 375 MG tablet Take 1 tablet (375 mg total) by mouth 2 (two) times daily. 09/13/13  Yes Janice Norrie, MD  ondansetron (ZOFRAN ODT) 4 MG disintegrating tablet Take 1 tablet (4 mg total) by mouth every 8 (eight) hours as needed for nausea or vomiting. 09/18/13  Yes Garald Balding, NP   oxyCODONE-acetaminophen (PERCOCET/ROXICET) 5-325 MG per tablet Take 1 tablet by mouth every 6 (six) hours as needed for severe pain. 09/18/13  Yes Garald Balding, NP  QUEtiapine Fumarate (SEROQUEL XR) 150 MG 24 hr tablet Take 150 mg by mouth at bedtime.   Yes Historical Provider, MD   Allergies  Allergen Reactions  . Trazodone And Nefazodone Other (See Comments)    Muscle spasms  . Shellfish-Derived Products Nausea And Vomiting    FAMILY HISTORY:  Family History  Problem Relation Age of Onset  . Hypertension Other   . Coronary artery disease Other     SOCIAL HISTORY:  reports that he has been smoking Cigarettes.  He has a 30 pack-year smoking history. He uses smokeless tobacco. He reports that he drinks about 25.2 ounces of alcohol per week. He reports that he uses illicit drugs (Cocaine and Marijuana).   REVIEW OF SYSTEMS:  Unable to complete as pt is encephalopathic.   SUBJECTIVE:  Sleepy but easily arouseable to voice.  VITAL SIGNS: Temp:  [98.5 F (36.9 C)-100.6 F (38.1 C)] 98.6 F (37 C) (06/02 2018) Pulse Rate:  [97-144] 131 (06/02 1751) Resp:  [26-44] 44 (06/02 1751) BP: (92-138)/(49-81) 138/81 mmHg (06/02 1751) SpO2:  [92 %-97 %] 97 % (06/02 1047)  HEMODYNAMICS:    VENTILATOR SETTINGS:    INTAKE / OUTPUT: Intake/Output     06/02 0701 - 06/03 0700   P.O. 360   I.V. (mL/kg) 445.5 (7.5)   Total Intake(mL/kg) 805.5 (13.6)   Urine (mL/kg/hr) 875 (1.1)   Total Output 875   Net -69.5       Stool Occurrence 3 x     PHYSICAL EXAMINATION: General: WDWN male, sedate in bed, in NAD. Neuro: Sedated. HEENT: [place]/AT. PERRL, sclerae anicteric.  Speech slurred. Cardiovascular: RRR, no M/R/G.  Lungs: Respirations even and unlabored.  CTA bilaterally, No W/R/R. Abdomen: BS x 4, soft, NT/ND.  Musculoskeletal: No gross deformities, no edema.  In restraints. Skin: Intact, warm, no rashes.    LABS: CBC  Recent Labs Lab 10/07/13 1037 10/08/13 0132  10/08/13 0731  WBC 8.9 8.8 10.4  HGB 8.6* 7.1* 8.1*  HCT 25.6* 21.9* 24.7*  PLT 70* 61* 72*   Coag's  Recent Labs Lab 10/06/13 0600 10/07/13 1037 10/08/13 0132  APTT  --  34  --   INR 1.53* 1.70* 1.21   BMET  Recent Labs Lab 10/07/13 1037 10/08/13 0132 10/08/13 1010  NA 134* 135* 134*  K 4.0 3.9 3.6*  CL 98 103 103  CO2 18* 21 19  BUN 4* 3* 3*  CREATININE 0.66 0.77 0.61  GLUCOSE 76 82 135*   Electrolytes  Recent Labs Lab 10/07/13 1037 10/08/13 0132 10/08/13 1010  CALCIUM 7.7* 7.2* 7.2*  MG 1.2* 2.1 1.9  PHOS 3.6  --   --    Sepsis  Markers  Recent Labs Lab 10/06/13 0306 10/07/13 1035 10/08/13 0133  LATICACIDVEN 3.6* 3.4* 0.8   ABG No results found for this basename: PHART, PCO2ART, PO2ART,  in the last 168 hours Liver Enzymes  Recent Labs Lab 10/07/13 1037 10/08/13 0132 10/08/13 1010  AST 262* 219* 194*  ALT 109* 87* 82*  ALKPHOS 418* 359* 337*  BILITOT 3.5* 3.4* 3.3*  ALBUMIN 1.8* 1.6* 1.4*   Cardiac Enzymes No results found for this basename: TROPONINI, PROBNP,  in the last 168 hours Glucose  Recent Labs Lab 10/05/13 2256 10/08/13 1908  GLUCAP 138* 115*    CXR:  No results found.  ASSESSMENT / PLAN:  NEUROLOGIC A:   Intentional drug overdose as part of suicide attempt Acute encephalopathy ETOH abuse / WD P:   - Sedation:  Precedex gtt would use to max 2.0/ Fentanyl PRN / Versed PRN.  - D/c Haldol. - Goal RASS -1 to 0. - Psych following, appreciate the assistance. - Suicide precautions. - Thiamine / Folate / Multivitamin. - Restraints.  PULMONARY A:   No acute issues. P:   -pcxr for atx -if worsening may need ETT for airway protection  CARDIOVASCULAR A:  Hx HTN ? WPW - not evident on EKG; however per chart review, question was raised at some point during admission. P:  - Monitor trop x 1 -avoid dig, BB as able with WPW -if SVT would use Procainamide  RENAL A:   Mild hyponatremia. P:   - NS @ 125  increase rate - BMP in AM. - Replace electrolytes as indicated.  GASTROINTESTINAL A:   Transaminitis - AST > ALT c/w ETOH abuse P:   - Trend LFT's. - NPO for now. -npo for airway concerns, add ppi  HEMATOLOGIC A:   Anemia - ETOH P:  - VTE Proph:  SCD's / Heparin - Transfuse for Hgb < 7. - CBC in AM.  INFECTIOUS A:   No acute issues. P:   - No intervention required.  ENDOCRINE  A:   At risk hypoglcyemia with liver dz P:   - glu check   Montey Hora, PA - [facility] Pgr: [phone]  or [phone]   I have personally obtained history, examined patient, evaluated and interpreted laboratory and imaging results, reviewed medical records, formulated assessment / plan and placed orders.  CRITICAL CARE:  The patient is critically ill with multiple organ systems failure and requires high complexity decision making for assessment and support, frequent evaluation and titration of therapies, application of advanced monitoring technologies and extensive interpretation of multiple databases. Critical Care Time devoted to patient care services described in this note is 40 minutes.   Lavon Paganini. Titus Mould, MD, [place] Pgr: [facility]

## 2013-10-08 NOTE — Progress Notes (Signed)
Physician notified: woods At: 1805  Regarding: No change after extra haldol dose. Still fighting and combative. CIWA 28, HR 130s, RR 30s. Needs higher level of care per protocol. Awaiting return response.   Returned Response at: 1810  Order(s): Will call PCCM, to start on precedex drip. Will be by shortly to get pt moved.

## 2013-10-09 DIAGNOSIS — T50902A Poisoning by unspecified drugs, medicaments and biological substances, intentional self-harm, initial encounter: Secondary | ICD-10-CM

## 2013-10-09 LAB — MRSA PCR SCREENING: MRSA BY PCR: NEGATIVE

## 2013-10-09 LAB — CBC
HEMATOCRIT: 21.5 % — AB (ref 39.0–52.0)
Hemoglobin: 7 g/dL — ABNORMAL LOW (ref 13.0–17.0)
MCH: 35.4 pg — ABNORMAL HIGH (ref 26.0–34.0)
MCHC: 32.6 g/dL (ref 30.0–36.0)
MCV: 108.6 fL — AB (ref 78.0–100.0)
Platelets: 73 10*3/uL — ABNORMAL LOW (ref 150–400)
RBC: 1.98 MIL/uL — AB (ref 4.22–5.81)
RDW: 17.7 % — ABNORMAL HIGH (ref 11.5–15.5)
WBC: 6.8 10*3/uL (ref 4.0–10.5)

## 2013-10-09 LAB — HEPATIC FUNCTION PANEL
ALBUMIN: 1.5 g/dL — AB (ref 3.5–5.2)
ALT: 74 U/L — ABNORMAL HIGH (ref 0–53)
AST: 153 U/L — ABNORMAL HIGH (ref 0–37)
Alkaline Phosphatase: 292 U/L — ABNORMAL HIGH (ref 39–117)
Bilirubin, Direct: 1.8 mg/dL — ABNORMAL HIGH (ref 0.0–0.3)
Indirect Bilirubin: 1.2 mg/dL — ABNORMAL HIGH (ref 0.3–0.9)
TOTAL PROTEIN: 5.7 g/dL — AB (ref 6.0–8.3)
Total Bilirubin: 3 mg/dL — ABNORMAL HIGH (ref 0.3–1.2)

## 2013-10-09 LAB — PHOSPHORUS: Phosphorus: 2.6 mg/dL (ref 2.3–4.6)

## 2013-10-09 LAB — GLUCOSE, CAPILLARY
Glucose-Capillary: 343 mg/dL — ABNORMAL HIGH (ref 70–99)
Glucose-Capillary: 78 mg/dL (ref 70–99)
Glucose-Capillary: 89 mg/dL (ref 70–99)

## 2013-10-09 LAB — MAGNESIUM: Magnesium: 1.7 mg/dL (ref 1.5–2.5)

## 2013-10-09 MED ORDER — ALBUTEROL SULFATE (2.5 MG/3ML) 0.083% IN NEBU: 2.5000 mg | INHALATION_SOLUTION | RESPIRATORY_TRACT | Status: DC | PRN

## 2013-10-09 MED ORDER — KCL IN DEXTROSE-NACL 20-5-0.9 MEQ/L-%-% IV SOLN
INTRAVENOUS | Status: DC
  Administered 2013-10-09 – 2013-10-11 (×3): via INTRAVENOUS
  Administered 2013-10-11: 75 mL/h via INTRAVENOUS
  Administered 2013-10-12 – 2013-10-14 (×3): via INTRAVENOUS
  Administered 2013-10-15: 75 mL/h via INTRAVENOUS
  Administered 2013-10-15: 04:00:00 via INTRAVENOUS
  Filled 2013-10-09 (×16): qty 1000

## 2013-10-09 MED ORDER — WHITE PETROLATUM GEL
Status: AC
  Administered 2013-10-09: 0.2
  Filled 2013-10-09: qty 5

## 2013-10-09 NOTE — Progress Notes (Signed)
eLink Physician-Brief Progress Note Patient Name: [name] DOB: Apr 07, 1971 MRN: [id number]  Date of Service  10/09/2013   HPI/Events of Note   Combative with RN  eICU Interventions  Increase upper limit on dexmedetomidine   Intervention Category Major Interventions: Delirium, psychosis, severe agitation - evaluation and management  Wilhelmina Mcardle 10/09/2013, 10:08 PM

## 2013-10-09 NOTE — Progress Notes (Signed)
PULMONARY / CRITICAL CARE MEDICINE  Name: [name] MRN: [id number] DOB: 12-21-1970    ADMISSION DATE:  10/05/2013  REFERRING MD :  TRH  CHIEF COMPLAINT:  Intentional Overdose  BRIEF PATIENT DESCRIPTION: 43 yo male brought to ED after intentionally overdose of flexeril, clonidine.  Transferred to ICU for precedex.  SIGNIFICANT EVENTS: 5/31 Admit, ETOH level 298, UDS negative 6/01 Psych consulted 6/02 Transfer to ICU  STUDIES:   LINES / TUBES: Foley 6/1 >>>  SUBJECTIVE:   Intermittently agitated overnight.  VITAL SIGNS: Temp:  [98.2 F (36.8 C)-100 F (37.8 C)] 100 F (37.8 C) (06/03 0355) Pulse Rate:  [98-144] 106 (06/03 0700) Resp:  [24-44] 38 (06/03 0600) BP: (95-153)/(57-81) 119/76 mmHg (06/03 0700) SpO2:  [97 %-100 %] 100 % (06/03 0700)  INTAKE / OUTPUT: Intake/Output     06/02 0701 - 06/03 0700 06/03 0701 - 06/04 0700   P.O. 360    I.V. (mL/kg) 2365.1 (40)    Total Intake(mL/kg) 2725.1 (46)    Urine (mL/kg/hr) 1775 (1.2)    Total Output 1775     Net +950.1          Stool Occurrence 3 x      PHYSICAL EXAMINATION: General: ill appearing Neuro: RASS -2, intermittently mumbles, moves extremities HEENT: pupils reactive Cardiovascular: regular, tachycardic  Lungs: no wheeze Abdomen: soft, non tender Musculoskeletal: no edema Skin: no rashes  LABS: CBC  Recent Labs Lab 10/08/13 0132 10/08/13 0731 10/09/13 0305  WBC 8.8 10.4 6.8  HGB 7.1* 8.1* 7.0*  HCT 21.9* 24.7* 21.5*  PLT 61* 72* 73*   Coag's  Recent Labs Lab 10/06/13 0600 10/07/13 1037 10/08/13 0132  APTT  --  34  --   INR 1.53* 1.70* 1.21   BMET  Recent Labs Lab 10/08/13 0132 10/08/13 1010 10/08/13 2035  NA 135* 134* 134*  K 3.9 3.6* 3.5*  CL 103 103 103  CO2 21 19 19   BUN 3* 3* <3*  CREATININE 0.77 0.61 0.52  GLUCOSE 82 135* 96   Electrolytes  Recent Labs Lab 10/07/13 1037 10/08/13 0132 10/08/13 1010 10/08/13 2035 10/09/13 0305  CALCIUM 7.7* 7.2* 7.2*  7.3*  --   MG 1.2* 2.1 1.9  --  1.7  PHOS 3.6  --   --   --  2.6   Sepsis Markers  Recent Labs Lab 10/07/13 1035 10/08/13 0133 10/08/13 2035  LATICACIDVEN 3.4* 0.8 1.5   Liver Enzymes  Recent Labs Lab 10/08/13 0132 10/08/13 1010 10/09/13 0305  AST 219* 194* 153*  ALT 87* 82* 74*  ALKPHOS 359* 337* 292*  BILITOT 3.4* 3.3* 3.0*  ALBUMIN 1.6* 1.4* 1.5*   Cardiac Enzymes  Recent Labs Lab 10/08/13 2035  TROPONINI <0.30   Glucose  Recent Labs Lab 10/05/13 2256 10/08/13 1908 10/09/13 0034 10/09/13 0354  GLUCAP 138* 115* 89 78    CXR:   Dg Chest Port 1 View  10/08/2013   CLINICAL DATA:  Assess infiltrates.  EXAM: PORTABLE CHEST - 1 VIEW  COMPARISON:  10/06/2013.  FINDINGS: Bilateral irregular interstitial thickening appears increased from the prior study, although this may be in part due to lower lung volumes and a more semi-erect positioning.  No pleural effusion. No pneumothorax. Cardiac silhouette is normal in size. Normal mediastinal and hilar contours.  IMPRESSION: Increased interstitial thickening. Diffuse bilateral interstitial infiltrates versus interstitial edema. Degree of interstitial thickening has increased when compared the prior study.   Electronically Signed   By: Dedra Skeens.D.  On: 10/08/2013 20:35    ASSESSMENT / PLAN:  NEUROLOGIC A:   Acute encephalopathy 2nd to ETOH withdrawal. Suicide attempt with intentional drug overdose. Hx of depression, seizures. P:   Continue precedex Thiamine, folic acid, dextrose in IV fluid F/u with Psych Sitter at bedside  PULMONARY A:   Tobacco abuse. P:   Oxygen as needed to keep SpO2 > 92% Monitor for aspiration PRN BD's  CARDIOVASCULAR A:  Sinus tachycardia. Hx of HTN. ?WPW >> not seen on ECG. P:  Monitor on tele Maintain hydration  RENAL A:   Hyponatremia. P:   Continue IV fluids Monitor renal fx, urine outpt, and electrolytes  GASTROINTESTINAL A:   Elevated LFT's >> likely from  ETOH. P:   NPO F/u LFT's Protonix for SUP  HEMATOLOGIC A:   Anemia 2nd to ETOH and iron deficiency. Thrombocytopenia 2nd to ETOH. P:  F/u CBC Transfuse for Hb < 7 Will need iron supplementation when take oral medications  INFECTIOUS A:   No acute issues. P:   Monitor for aspiration  ENDOCRINE  A:   No acute issues. P:   Monitor blood sugar on BMET  CC time 35 minutes.  Chesley Mires, MD [facility] Pulmonary/Critical Care 10/09/2013, 8:46 AM Pager:  [phone] After 3pm call: [phone]

## 2013-10-09 NOTE — Progress Notes (Signed)
INITIAL NUTRITION ASSESSMENT  DOCUMENTATION CODES Per approved criteria  -Severe malnutrition in the context of chronic illness   INTERVENTION:  Diet advancement as able pending improvement in mental status.  If unable to advance diet in the next 2 days, recommend place NGT and initiate TF, Jevity 1.2 at 70 ml/h will provide 2016 kcals, 93 gm protein, 1361 ml free water daily.  NUTRITION DIAGNOSIS: Inadequate oral intake related to decreased alertness as evidenced by NPO status.   Goal: Intake to meet >90% of estimated nutrition needs.  Monitor:  Diet advancement, PO intake, labs, weight trend.  Reason for Assessment: MST  43 y.o. male  Admitting Dx: Suicide attempt  ASSESSMENT: 43 y.o. M brought to ED 5/31 after he intentionally overdosed on flexeril and clonodine as part of suicide attempt. Was admitted by [facility]; however, became more agitated/combative. On 6/2, PCCM was consulted for transfer to ICU and initiation of precedex.  Patient has been agitated and combative. Currently sleeping, receiving Precedex. Patient unable to provide any nutrition history at this time.  Nutrition Focused Physical Exam:  Subcutaneous Fat:  Orbital Region: WNL Upper Arm Region: NA Thoracic and Lumbar Region: NA  Muscle:  Temple Region: WNL  Clavicle Bone Region: mild depletion Clavicle and Acromion Bone Region: mild depletion Scapular Bone Region: NA Dorsal Hand: NA Patellar Region: severe depletion Anterior Thigh Region: severe depletion Posterior Calf Region: severe depletion  Edema: none  Pt meets criteria for severe MALNUTRITION in the context of chronic illness as evidenced by severe depletion of muscle mass and 12% weight loss within one month.  Height: Ht Readings from Last 1 Encounters:  10/06/13 5\' 9"  (1.753 m)    Weight: Wt Readings from Last 1 Encounters:  10/06/13 130 lb 8.2 oz (59.2 kg)    Ideal Body Weight: 72.7 kg  % Ideal Body Weight: 81%  Wt Readings  from Last 10 Encounters:  10/06/13 130 lb 8.2 oz (59.2 kg)  09/13/13 147 lb (66.679 kg)  08/16/13 147 lb (66.679 kg)  08/08/13 147 lb (66.679 kg)  04/22/13 145 lb (65.772 kg)  03/20/13 151 lb 3.2 oz (68.584 kg)  03/16/13 143 lb (64.864 kg)  03/07/13 142 lb 3.2 oz (64.5 kg)  03/07/13 142 lb 3.2 oz (64.5 kg)  03/02/13 140 lb 1.6 oz (63.549 kg)    Usual Body Weight: 147 lb  % Usual Body Weight: 88%  BMI:  Body mass index is 19.26 kg/(m^2).  Estimated Nutritional Needs: Kcal: 1900-2100 Protein: 90-110 gm Fluid: 2 L  Skin: no wounds  Diet Order: NPO  EDUCATION NEEDS: -No education needs identified at this time   Intake/Output Summary (Last 24 hours) at 10/09/13 1457 Last data filed at 10/09/13 1400  Gross per 24 hour  Intake 2667.4 ml  Output   2375 ml  Net  292.4 ml    Last BM: 6/3   Labs:   Recent Labs Lab 10/06/13 0600 10/07/13 1037 10/08/13 0132 10/08/13 1010 10/08/13 2035 10/09/13 0305  NA 133* 134* 135* 134* 134*  --   K 4.2 4.0 3.9 3.6* 3.5*  --   CL 101 98 103 103 103  --   CO2 14* 18* 21 19 19   --   BUN 7 4* 3* 3* <3*  --   CREATININE 0.64 0.66 0.77 0.61 0.52  --   CALCIUM 7.3* 7.7* 7.2* 7.2* 7.3*  --   MG  --  1.2* 2.1 1.9  --  1.7  PHOS  --  3.6  --   --   --  2.6  GLUCOSE 137* 76 82 135* 96  --     CBG (last 3)   Recent Labs  10/09/13 0034 10/09/13 0354 10/09/13 1143  GLUCAP 89 78 343*    Scheduled Meds: . folic acid  1 mg Intravenous Daily  . multivitamin with minerals  1 tablet Oral Daily  . pantoprazole (PROTONIX) IV  40 mg Intravenous Q24H  . thiamine IV  100 mg Intravenous Daily    Continuous Infusions: . dexmedetomidine 0.7 mcg/kg/hr (10/09/13 0312)  . dextrose 5 % and 0.9 % NaCl with KCl 20 mEq/L 75 mL/hr at 10/09/13 0254    Past Medical History  Diagnosis Date  . Hypertension   . Asthma   . Pancreatitis   . Cocaine abuse   . Depression   . H/O suicide attempt   . Heart murmur     "when he was little"  (03/06/2013)  . Shortness of breath     "can happen at anytime" (03/06/2013)  . Anemia   . H/O hiatal hernia   . GERD (gastroesophageal reflux disease)   . Seizures     "weekly" (03/06/2013)  . Arthritis     "knees" (03/06/2013)  . Anxiety   . WPW (Wolff-Parkinson-White syndrome)     [name] 03/06/2013    Past Surgical History  Procedure Laterality Date  . Facial fracture surgery Left 1990's    "result of trauma" (03/06/2013)  . Eye surgery Left 1990's    "result of trauma" (03/06/2013)    Molli Barrows, [place], [place], [place] Pager [phone] After Hours Pager [phone]

## 2013-10-09 NOTE — Consult Note (Signed)
[facility] Face-to-Face Psychiatry Consult   Reason for Consult:  Alcohol intoxication and suicide attempt Referring Physician:  Dr. [name] is an 43 y.o. male. Total Time spent with patient: 45 minutes  Assessment: AXIS I:  Substance Induced Mood Disorder and Alcohol intoxication and withdrawal symptoms AXIS II:  Deferred AXIS III:   Past Medical History  Diagnosis Date  . Hypertension   . Asthma   . Pancreatitis   . Cocaine abuse   . Depression   . H/O suicide attempt   . Heart murmur     "when he was little" (03/06/2013)  . Shortness of breath     "can happen at anytime" (03/06/2013)  . Anemia   . H/O hiatal hernia   . GERD (gastroesophageal reflux disease)   . Seizures     "weekly" (03/06/2013)  . Arthritis     "knees" (03/06/2013)  . Anxiety   . WPW (Wolff-Parkinson-White syndrome)     [name] 03/06/2013   AXIS IV:  other psychosocial or environmental problems, problems related to social environment and problems with primary support group AXIS V:  41-50 serious symptoms  Plan:  Recommend psychiatric Inpatient admission when medically cleared. Supportive therapy provided about ongoing stressors. Continuous safety sitter and CIWA protocol and continue current medication management  Subjective:   [name] is a 43 y.o. male patient admitted with suicide attempt and alcohol intoxication.  HPI:  Patient was seen and chart reviewed. Psychiatric consultation was requested a suicide attempt while intoxicated. This is a 43 yo male with history of alcohol abuse versus dependence, elevated liver enzymes, depression/anxiety comes in after intentionally ingesting some of his home meds to kill himself per intial report he told the emergency department on arrival. Patient was disheveled, poorly groomed and denies current symptoms of depression, anxiety and psychosis. Patient was placed in the 4 point restraints as he is being combative and removing catheters and  intravenous tubes. He tells me he did this to try to get high off of the flexeril. He says he took 15 pills, combination of some of his flexeril and some of his clonidine. His girlfriend is pregnant and is planning on getting an abortion tomorrow, and they were argueing about that, and he got very upset and took a bunch of his pills. This occurred at about 10pm. He received charcoal in the emergency department. He also reports he has been drinking beer.   Review of Systems:  Unobtainable  Interval history: The patient was to the intensive care unit secondary to increased alcohol withdrawal symptoms including combative, irritable, upset, pulling IV lines and required restraints physically and was treated with precedex. Patient was not able to communicate except a few words and nodding his head during this visit. Patient has limited insight and judgment about his current clinical condition and asking to send home. Patient was intoxicated and then overdosed medication to end his life because of conflicts with his girlfriend who lives in [place] and with his brother at home. 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 Patient minimized his alcohol drinking saying he drinks only 4 beers a day. His blood alcohol level in the emergency department his 298. Patient has a history of alcohol detox treatment 2012 at [facility] which was [facility].   HPI Elements:   Location:  Depression and alcohol abuse. Quality:  poor. Severity:  Acute. Timing:  Suicide attempt and intoxication.  Past Psychiatric History: Past  Medical History  Diagnosis Date  . Hypertension   . Asthma   . Pancreatitis   . Cocaine abuse   . Depression   . H/O suicide attempt   . Heart murmur     "when he was little" (03/06/2013)  . Shortness of breath     "can happen at anytime" (03/06/2013)  . Anemia   . H/O hiatal hernia   . GERD (gastroesophageal reflux disease)   . Seizures      "weekly" (03/06/2013)  . Arthritis     "knees" (03/06/2013)  . Anxiety   . WPW (Wolff-Parkinson-White syndrome)     [name] 03/06/2013    reports that he has been smoking Cigarettes.  He has a 30 pack-year smoking history. He uses smokeless tobacco. He reports that he drinks about 25.2 ounces of alcohol per week. He reports that he uses illicit drugs (Cocaine and Marijuana). Family History  Problem Relation Age of Onset  . Hypertension Other   . Coronary artery disease Other            Allergies:   Allergies  Allergen Reactions  . Trazodone And Nefazodone Other (See Comments)    Muscle spasms  . Shellfish-Derived Products Nausea And Vomiting    ACT Assessment Complete:  No Objective: Blood pressure 139/76, pulse 95, temperature 98.9 F (37.2 C), temperature source Oral, resp. rate 30, height _0  (1.753 m), weight 59.2 kg (130 lb 8.2 oz), SpO2 100.00%.Body mass index is 19.26 kg/(m^2). Results for orders placed during the hospital encounter of 10/05/13 (from the past 72 hour(s))  MRSA PCR SCREENING     Status: None   Collection Time    10/07/13  8:26 AM      Result Value Ref Range   MRSA by PCR NEGATIVE  NEGATIVE   Comment:            The GeneXpert MRSA Assay (FDA     approved for NASAL specimens     only), is one component of a     comprehensive MRSA colonization     surveillance program. It is not     intended to diagnose MRSA     infection nor to guide or     monitor treatment for     MRSA infections.  LACTIC ACID, PLASMA     Status: Abnormal   Collection Time    10/07/13 10:35 AM      Result Value Ref Range   Lactic Acid, Venous 3.4 (*) 0.5 - 2.2 mmol/L  COMPREHENSIVE METABOLIC PANEL     Status: Abnormal   Collection Time    10/07/13 10:37 AM      Result Value Ref Range   Sodium 134 (*) 137 - 147 mEq/L   Potassium 4.0  3.7 - 5.3 mEq/L   Chloride 98  96 - 112 mEq/L   CO2 18 (*) 19 - 32 mEq/L   Glucose, Bld 76  70 - 99 mg/dL   BUN 4 (*) 6 - 23 mg/dL    Creatinine, Ser 0.66  0.50 - 1.35 mg/dL   Calcium 7.7 (*) 8.4 - 10.5 mg/dL   Total Protein 6.2  6.0 - 8.3 g/dL   Albumin 1.8 (*) 3.5 - 5.2 g/dL   AST 262 (*) 0 - 37 U/L   Comment: HEMOLYSIS AT THIS LEVEL MAY AFFECT RESULT   ALT 109 (*) 0 - 53 U/L   Alkaline Phosphatase 418 (*) 39 - 117 U/L   Total Bilirubin 3.5 (*) 0.3 -  1.2 mg/dL   GFR calc non Af Amer >90  >90 mL/min   GFR calc Af Amer >90  >90 mL/min   Comment: (NOTE)     The eGFR has been calculated using the CKD EPI equation.     This calculation has not been validated in all clinical situations.     eGFR's persistently <90 mL/min signify possible Chronic Kidney     Disease.  CBC     Status: Abnormal   Collection Time    10/07/13 10:37 AM      Result Value Ref Range   WBC 8.9  4.0 - 10.5 K/uL   RBC 2.41 (*) 4.22 - 5.81 MIL/uL   Hemoglobin 8.6 (*) 13.0 - 17.0 g/dL   Comment: REPEATED TO VERIFY   HCT 25.6 (*) 39.0 - 52.0 %   MCV 106.2 (*) 78.0 - 100.0 fL   MCH 35.7 (*) 26.0 - 34.0 pg   MCHC 33.6  30.0 - 36.0 g/dL   RDW 18.0 (*) 11.5 - 15.5 %   Platelets 70 (*) 150 - 400 K/uL   Comment: CONSISTENT WITH PREVIOUS RESULT  MAGNESIUM     Status: Abnormal   Collection Time    10/07/13 10:37 AM      Result Value Ref Range   Magnesium 1.2 (*) 1.5 - 2.5 mg/dL  PHOSPHORUS     Status: None   Collection Time    10/07/13 10:37 AM      Result Value Ref Range   Phosphorus 3.6  2.3 - 4.6 mg/dL  HEMOGLOBIN A1C     Status: None   Collection Time    10/07/13 10:37 AM      Result Value Ref Range   Hemoglobin A1C 4.5  <5.7 %   Comment: (NOTE)                                                                               According to the ADA Clinical Practice Recommendations for 2011, when     HbA1c is used as a screening test:      >=6.5%   Diagnostic of Diabetes Mellitus               (if abnormal result is confirmed)     5.7-6.4%   Increased risk of developing Diabetes Mellitus     References:Diagnosis and Classification of  Diabetes Mellitus,Diabetes     TIWP,8099,83(JASNK 1):S62-S69 and Standards of Medical Care in             Diabetes - 2011,Diabetes Care,2011,34 (Suppl 1):S11-S61.   Mean Plasma Glucose 82  <117 mg/dL   Comment: Performed at [facility]     Status: Abnormal   Collection Time    10/07/13 10:37 AM      Result Value Ref Range   Prothrombin Time 14.7  11.6 - 15.2 seconds   INR 1.70 (*) 0.00 - 1.49  APTT     Status: None   Collection Time    10/07/13 10:37 AM      Result Value Ref Range   aPTT 34  24 - 37 seconds  OCCULT BLOOD X 1 CARD TO LAB, STOOL  Status: None   Collection Time    10/08/13 12:29 AM      Result Value Ref Range   Fecal Occult Bld NEGATIVE  NEGATIVE  MAGNESIUM     Status: None   Collection Time    10/08/13  1:32 AM      Result Value Ref Range   Magnesium 2.1  1.5 - 2.5 mg/dL  COMPREHENSIVE METABOLIC PANEL     Status: Abnormal   Collection Time    10/08/13  1:32 AM      Result Value Ref Range   Sodium 135 (*) 137 - 147 mEq/L   Potassium 3.9  3.7 - 5.3 mEq/L   Chloride 103  96 - 112 mEq/L   CO2 21  19 - 32 mEq/L   Glucose, Bld 82  70 - 99 mg/dL   BUN 3 (*) 6 - 23 mg/dL   Creatinine, Ser 0.77  0.50 - 1.35 mg/dL   Calcium 7.2 (*) 8.4 - 10.5 mg/dL   Total Protein 5.8 (*) 6.0 - 8.3 g/dL   Albumin 1.6 (*) 3.5 - 5.2 g/dL   AST 219 (*) 0 - 37 U/L   ALT 87 (*) 0 - 53 U/L   Alkaline Phosphatase 359 (*) 39 - 117 U/L   Total Bilirubin 3.4 (*) 0.3 - 1.2 mg/dL   GFR calc non Af Amer >90  >90 mL/min   GFR calc Af Amer >90  >90 mL/min   Comment: (NOTE)     The eGFR has been calculated using the CKD EPI equation.     This calculation has not been validated in all clinical situations.     eGFR's persistently <90 mL/min signify possible Chronic Kidney     Disease.  CBC     Status: Abnormal   Collection Time    10/08/13  1:32 AM      Result Value Ref Range   WBC 8.8  4.0 - 10.5 K/uL   RBC 2.06 (*) 4.22 - 5.81 MIL/uL   Hemoglobin 7.1 (*) 13.0 -  17.0 g/dL   Comment: REPEATED TO VERIFY   HCT 21.9 (*) 39.0 - 52.0 %   MCV 106.3 (*) 78.0 - 100.0 fL   MCH 34.5 (*) 26.0 - 34.0 pg   MCHC 32.4  30.0 - 36.0 g/dL   RDW 18.1 (*) 11.5 - 15.5 %   Platelets 61 (*) 150 - 400 K/uL   Comment: CONSISTENT WITH PREVIOUS RESULT  PROTIME-INR     Status: None   Collection Time    10/08/13  1:32 AM      Result Value Ref Range   Prothrombin Time 15.0  11.6 - 15.2 seconds   INR 1.21  0.00 - 1.49  LACTIC ACID, PLASMA     Status: None   Collection Time    10/08/13  1:33 AM      Result Value Ref Range   Lactic Acid, Venous 0.8  0.5 - 2.2 mmol/L  OCCULT BLOOD X 1 CARD TO LAB, STOOL     Status: None   Collection Time    10/08/13  6:32 AM      Result Value Ref Range   Fecal Occult Bld NEGATIVE  NEGATIVE  CBC     Status: Abnormal   Collection Time    10/08/13  7:31 AM      Result Value Ref Range   WBC 10.4  4.0 - 10.5 K/uL   RBC 2.31 (*) 4.22 - 5.81 MIL/uL   Hemoglobin 8.1 (*)  13.0 - 17.0 g/dL   HCT 24.7 (*) 39.0 - 52.0 %   MCV 106.9 (*) 78.0 - 100.0 fL   MCH 35.1 (*) 26.0 - 34.0 pg   MCHC 32.8  30.0 - 36.0 g/dL   RDW 18.2 (*) 11.5 - 15.5 %   Platelets 72 (*) 150 - 400 K/uL   Comment: SPECIMEN CHECKED FOR CLOTS     REPEATED TO VERIFY     CONSISTENT WITH PREVIOUS RESULT  COMPREHENSIVE METABOLIC PANEL     Status: Abnormal   Collection Time    10/08/13 10:10 AM      Result Value Ref Range   Sodium 134 (*) 137 - 147 mEq/L   Potassium 3.6 (*) 3.7 - 5.3 mEq/L   Chloride 103  96 - 112 mEq/L   CO2 19  19 - 32 mEq/L   Glucose, Bld 135 (*) 70 - 99 mg/dL   BUN 3 (*) 6 - 23 mg/dL   Creatinine, Ser 0.61  0.50 - 1.35 mg/dL   Calcium 7.2 (*) 8.4 - 10.5 mg/dL   Total Protein 5.5 (*) 6.0 - 8.3 g/dL   Albumin 1.4 (*) 3.5 - 5.2 g/dL   AST 194 (*) 0 - 37 U/L   ALT 82 (*) 0 - 53 U/L   Alkaline Phosphatase 337 (*) 39 - 117 U/L   Total Bilirubin 3.3 (*) 0.3 - 1.2 mg/dL   GFR calc non Af Amer >90  >90 mL/min   GFR calc Af Amer >90  >90 mL/min   Comment:  (NOTE)     The eGFR has been calculated using the CKD EPI equation.     This calculation has not been validated in all clinical situations.     eGFR's persistently <90 mL/min signify possible Chronic Kidney     Disease.  MAGNESIUM     Status: None   Collection Time    10/08/13 10:10 AM      Result Value Ref Range   Magnesium 1.9  1.5 - 2.5 mg/dL  GLUCOSE, CAPILLARY     Status: Abnormal   Collection Time    10/08/13  7:08 PM      Result Value Ref Range   Glucose-Capillary 115 (*) 70 - 99 mg/dL  BASIC METABOLIC PANEL     Status: Abnormal   Collection Time    10/08/13  8:35 PM      Result Value Ref Range   Sodium 134 (*) 137 - 147 mEq/L   Potassium 3.5 (*) 3.7 - 5.3 mEq/L   Chloride 103  96 - 112 mEq/L   CO2 19  19 - 32 mEq/L   Glucose, Bld 96  70 - 99 mg/dL   BUN <3 (*) 6 - 23 mg/dL   Creatinine, Ser 0.52  0.50 - 1.35 mg/dL   Calcium 7.3 (*) 8.4 - 10.5 mg/dL   GFR calc non Af Amer >90  >90 mL/min   GFR calc Af Amer >90  >90 mL/min   Comment: (NOTE)     The eGFR has been calculated using the CKD EPI equation.     This calculation has not been validated in all clinical situations.     eGFR's persistently <90 mL/min signify possible Chronic Kidney     Disease.  TROPONIN I     Status: None   Collection Time    10/08/13  8:35 PM      Result Value Ref Range   Troponin I <0.30  <0.30 ng/mL   Comment:  Due to the release kinetics of cTnI,     a negative result within the first hours     of the onset of symptoms does not rule out     myocardial infarction with certainty.     If myocardial infarction is still suspected,     repeat the test at appropriate intervals.  LACTIC ACID, PLASMA     Status: None   Collection Time    10/08/13  8:35 PM      Result Value Ref Range   Lactic Acid, Venous 1.5  0.5 - 2.2 mmol/L  GLUCOSE, CAPILLARY     Status: None   Collection Time    10/09/13 12:34 AM      Result Value Ref Range   Glucose-Capillary 89  70 - 99 mg/dL  MRSA PCR  SCREENING     Status: None   Collection Time    10/09/13  1:00 AM      Result Value Ref Range   MRSA by PCR NEGATIVE  NEGATIVE   Comment:            The GeneXpert MRSA Assay (FDA     approved for NASAL specimens     only), is one component of a     comprehensive MRSA colonization     surveillance program. It is not     intended to diagnose MRSA     infection nor to guide or     monitor treatment for     MRSA infections.  CBC     Status: Abnormal   Collection Time    10/09/13  3:05 AM      Result Value Ref Range   WBC 6.8  4.0 - 10.5 K/uL   RBC 1.98 (*) 4.22 - 5.81 MIL/uL   Hemoglobin 7.0 (*) 13.0 - 17.0 g/dL   HCT 21.5 (*) 39.0 - 52.0 %   MCV 108.6 (*) 78.0 - 100.0 fL   MCH 35.4 (*) 26.0 - 34.0 pg   MCHC 32.6  30.0 - 36.0 g/dL   RDW 17.7 (*) 11.5 - 15.5 %   Platelets 73 (*) 150 - 400 K/uL   Comment: CONSISTENT WITH PREVIOUS RESULT  PHOSPHORUS     Status: None   Collection Time    10/09/13  3:05 AM      Result Value Ref Range   Phosphorus 2.6  2.3 - 4.6 mg/dL  MAGNESIUM     Status: None   Collection Time    10/09/13  3:05 AM      Result Value Ref Range   Magnesium 1.7  1.5 - 2.5 mg/dL  HEPATIC FUNCTION PANEL     Status: Abnormal   Collection Time    10/09/13  3:05 AM      Result Value Ref Range   Total Protein 5.7 (*) 6.0 - 8.3 g/dL   Albumin 1.5 (*) 3.5 - 5.2 g/dL   AST 153 (*) 0 - 37 U/L   ALT 74 (*) 0 - 53 U/L   Alkaline Phosphatase 292 (*) 39 - 117 U/L   Total Bilirubin 3.0 (*) 0.3 - 1.2 mg/dL   Bilirubin, Direct 1.8 (*) 0.0 - 0.3 mg/dL   Indirect Bilirubin 1.2 (*) 0.3 - 0.9 mg/dL  GLUCOSE, CAPILLARY     Status: None   Collection Time    10/09/13  3:54 AM      Result Value Ref Range   Glucose-Capillary 78  70 - 99 mg/dL  GLUCOSE, CAPILLARY  Status: Abnormal   Collection Time    10/09/13 11:43 AM      Result Value Ref Range   Glucose-Capillary 343 (*) 70 - 99 mg/dL   Comment: PATIENT IDENTIFICATION ERROR. PLEASE DISREGARD RESULTS. ACCOUNT WILL BE  CREDITED.   Comment 1 Notify RN     Labs are reviewed and are pertinent for alcohol intoxication.  Current Facility-Administered Medications  Medication Dose Route Frequency Provider Last Rate Last Dose  . albuterol (PROVENTIL) (2.5 MG/3ML) 0.083% nebulizer solution 2.5 mg  2.5 mg Inhalation Q2H PRN Chesley Mires, MD      . dexmedetomidine (PRECEDEX) 400 MCG/100ML infusion  0.2-0.7 mcg/kg/hr Intravenous Titrated Raylene Miyamoto, MD 10.4 mL/hr at 10/09/13 1604 0.7 mcg/kg/hr at 10/09/13 1604  . dextrose 5 % and 0.9 % NaCl with KCl 20 mEq/L infusion   Intravenous Continuous Chesley Mires, MD 75 mL/hr at 10/09/13 6269    . fentaNYL (SUBLIMAZE) injection 25 mcg  25 mcg Intravenous Q2H PRN Raylene Miyamoto, MD   25 mcg at 10/09/13 1600  . folic acid injection 1 mg  1 mg Intravenous Daily Cherene Altes, MD   1 mg at 10/09/13 4854  . midazolam (VERSED) injection 1-2 mg  1-2 mg Intravenous Q1H PRN Raylene Miyamoto, MD   1 mg at 10/09/13 1559  . multivitamin with minerals tablet 1 tablet  1 tablet Oral Daily Raylene Miyamoto, MD      . pantoprazole (PROTONIX) injection 40 mg  40 mg Intravenous Q24H Raylene Miyamoto, MD   40 mg at 10/08/13 2200  . thiamine (B-1) injection 100 mg  100 mg Intravenous Daily Cherene Altes, MD   100 mg at 10/09/13 6270    Psychiatric Specialty Exam: Physical Exam  ROS  Blood pressure 139/76, pulse 95, temperature 98.9 F (37.2 C), temperature source Oral, resp. rate 30, height _0  (1.753 m), weight 59.2 kg (130 lb 8.2 oz), SpO2 100.00%.Body mass index is 19.26 kg/(m^2).  General Appearance: Disheveled and Guarded  Eye Sport and exercise psychologist::  Fair  Speech:  Slurred  Volume:  Decreased  Mood:  Angry, Anxious and Depressed  Affect:  Depressed and Flat  Thought Process:  Loose  Orientation:  NA  Thought Content:  Rumination  Suicidal Thoughts:  Yes.  with intent/plan  Homicidal Thoughts:  No  Memory:  Immediate;   Fair  Judgement:  Poor  Insight:  Lacking   Psychomotor Activity:  Increased  Concentration:  Fair  Recall:  [place]: Fair  Akathisia:  NA  Handed:  Right  AIMS (if indicated):     Assets:  Communication Skills Desire for Improvement Financial Resources/Insurance Housing Intimacy Leisure Time Physical Health Resilience Social Support  Sleep:      Musculoskeletal: Strength & Muscle Tone: within normal limits Gait & Station: unable to stand Patient leans: N/A  Treatment Plan Summary: Daily contact with patient to assess and evaluate symptoms and progress in treatment Medication management  Durward Parcel 10/09/2013 6:26 PM

## 2013-10-10 DIAGNOSIS — E43 Unspecified severe protein-calorie malnutrition: Secondary | ICD-10-CM | POA: Insufficient documentation

## 2013-10-10 LAB — CBC
HCT: 20.4 % — ABNORMAL LOW (ref 39.0–52.0)
Hemoglobin: 6.5 g/dL — CL (ref 13.0–17.0)
MCH: 34.8 pg — AB (ref 26.0–34.0)
MCHC: 31.9 g/dL (ref 30.0–36.0)
MCV: 109.1 fL — ABNORMAL HIGH (ref 78.0–100.0)
PLATELETS: 67 10*3/uL — AB (ref 150–400)
RBC: 1.87 MIL/uL — ABNORMAL LOW (ref 4.22–5.81)
RDW: 17.1 % — ABNORMAL HIGH (ref 11.5–15.5)
WBC: 5.6 10*3/uL (ref 4.0–10.5)

## 2013-10-10 LAB — HEMOGLOBIN AND HEMATOCRIT, BLOOD
HCT: 26 % — ABNORMAL LOW (ref 39.0–52.0)
Hemoglobin: 8.5 g/dL — ABNORMAL LOW (ref 13.0–17.0)

## 2013-10-10 LAB — MAGNESIUM: MAGNESIUM: 1.6 mg/dL (ref 1.5–2.5)

## 2013-10-10 LAB — COMPREHENSIVE METABOLIC PANEL
ALT: 57 U/L — AB (ref 0–53)
AST: 94 U/L — AB (ref 0–37)
Albumin: 1.5 g/dL — ABNORMAL LOW (ref 3.5–5.2)
Alkaline Phosphatase: 241 U/L — ABNORMAL HIGH (ref 39–117)
BUN: <3 mg/dL — ABNORMAL LOW (ref 6–23)
CHLORIDE: 107 meq/L (ref 96–112)
CO2: 21 mEq/L (ref 19–32)
Calcium: 7.2 mg/dL — ABNORMAL LOW (ref 8.4–10.5)
Creatinine, Ser: 0.61 mg/dL (ref 0.50–1.35)
GLUCOSE: 109 mg/dL — AB (ref 70–99)
Potassium: 3.5 mEq/L — ABNORMAL LOW (ref 3.7–5.3)
Sodium: 137 mEq/L (ref 137–147)
Total Bilirubin: 2.9 mg/dL — ABNORMAL HIGH (ref 0.3–1.2)
Total Protein: 5.7 g/dL — ABNORMAL LOW (ref 6.0–8.3)

## 2013-10-10 LAB — PHOSPHORUS: Phosphorus: 2.6 mg/dL (ref 2.3–4.6)

## 2013-10-10 LAB — PREPARE RBC (CROSSMATCH)

## 2013-10-10 MED ORDER — MAGNESIUM SULFATE IN D5W 10-5 MG/ML-% IV SOLN
1.0000 g | Freq: Once | INTRAVENOUS | Status: AC
  Administered 2013-10-10: 1 g via INTRAVENOUS
  Filled 2013-10-10: qty 100

## 2013-10-10 MED ORDER — LORAZEPAM 2 MG/ML IJ SOLN
2.0000 mg | INTRAMUSCULAR | Status: DC | PRN
  Administered 2013-10-10: 4 mg via INTRAVENOUS
  Administered 2013-10-10: 2 mg via INTRAVENOUS
  Administered 2013-10-10 (×2): 4 mg via INTRAVENOUS
  Administered 2013-10-11: 2 mg via INTRAVENOUS
  Administered 2013-10-11 (×3): 4 mg via INTRAVENOUS
  Administered 2013-10-11: 2 mg via INTRAVENOUS
  Administered 2013-10-11 – 2013-10-12 (×4): 4 mg via INTRAVENOUS
  Administered 2013-10-12: 2 mg via INTRAVENOUS
  Administered 2013-10-13: 4 mg via INTRAVENOUS
  Administered 2013-10-13 – 2013-10-14 (×5): 2 mg via INTRAVENOUS
  Filled 2013-10-10 (×2): qty 2
  Filled 2013-10-10 (×2): qty 1
  Filled 2013-10-10: qty 2
  Filled 2013-10-10: qty 1
  Filled 2013-10-10 (×5): qty 2
  Filled 2013-10-10 (×2): qty 1
  Filled 2013-10-10 (×2): qty 2
  Filled 2013-10-10 (×3): qty 1
  Filled 2013-10-10 (×3): qty 2

## 2013-10-10 MED ORDER — POTASSIUM CHLORIDE 10 MEQ/100ML IV SOLN
10.0000 meq | INTRAVENOUS | Status: AC
  Administered 2013-10-10 (×3): 10 meq via INTRAVENOUS
  Filled 2013-10-10 (×3): qty 100

## 2013-10-10 NOTE — Progress Notes (Signed)
CRITICAL VALUE ALERT  Critical value received:  hgb 6.5  Date of notification:  10/10/2013   Time of notification:  9767  Critical value read back:yes  Nurse who received alert:  Corrinne Eagle RN  MD notified (1st page):  Dr. Marguerite Olea)  Time of first page:  0502  MD notified (2nd page):  Time of second page:  Responding MD:  Dr. Earnest Conroy  Time MD responded:  [phone]

## 2013-10-10 NOTE — Progress Notes (Signed)
eLink Physician-Brief Progress Note Patient Name: [name] DOB: [birth year] MRN: [id number]  Date of Service  10/10/2013   HPI/Events of Note    Recent Labs Lab 10/07/13 1037 10/08/13 0132 10/08/13 0731 10/09/13 0305 10/10/13 0320  HGB 8.6* 7.1* 8.1* 7.0* 6.5*    eICU Interventions   Transfuse 1 unit PRBC   Intervention Category Intermediate Interventions: Other:  [name] 10/10/2013, 5:06 AM

## 2013-10-10 NOTE — Progress Notes (Signed)
PULMONARY / CRITICAL CARE MEDICINE  Name: [name] MRN: [id number] DOB: 1970/07/29    ADMISSION DATE:  10/05/2013  REFERRING MD :  TRH  CHIEF COMPLAINT:  Intentional Overdose  BRIEF PATIENT DESCRIPTION: 43 yo male brought to ED after intentionally overdose of flexeril, clonidine.  Transferred to ICU for precedex.  SIGNIFICANT EVENTS: 5/31 Admit, ETOH level 298, UDS negative 6/01 Psych consulted 6/02 Transfer to ICU, start precedex 6/04 Transfuse 1 unit PRBC  STUDIES:   LINES / TUBES: Foley 6/1 >>>  SUBJECTIVE:   Remains agitated, confused  VITAL SIGNS: Temp:  [97.6 F (36.4 C)-99.4 F (37.4 C)] 98.1 F (36.7 C) (06/04 1000) Pulse Rate:  [42-107] 90 (06/04 1000) Resp:  [17-39] 36 (06/04 1100) BP: (101-178)/(34-97) 143/94 mmHg (06/04 1100) SpO2:  [82 %-100 %] 91 % (06/04 1000)  INTAKE / OUTPUT: Intake/Output     06/03 0701 - 06/04 0700 06/04 0701 - 06/05 0700   P.O.     I.V. (mL/kg) 2172.4 (36.7) 163.4 (2.8)   Blood  335   Total Intake(mL/kg) 2172.4 (36.7) 498.4 (8.4)   Urine (mL/kg/hr) 1800 (1.3) 800 (3)   Stool 200 (0.1)    Total Output 2000 800   Net +172.4 -301.6          PHYSICAL EXAMINATION: General: ill appearing Neuro: RASS +3, intermittently mumbles, moves extremities HEENT: pupils reactive Cardiovascular: regular, tachycardic  Lungs: no wheeze Abdomen: soft, non tender Musculoskeletal: no edema Skin: no rashes  LABS: CBC  Recent Labs Lab 10/08/13 0731 10/09/13 0305 10/10/13 0320  WBC 10.4 6.8 5.6  HGB 8.1* 7.0* 6.5*  HCT 24.7* 21.5* 20.4*  PLT 72* 73* 67*   Coag's  Recent Labs Lab 10/06/13 0600 10/07/13 1037 10/08/13 0132  APTT  --  34  --   INR 1.53* 1.70* 1.21   BMET  Recent Labs Lab 10/08/13 1010 10/08/13 2035 10/10/13 0320  NA 134* 134* 137  K 3.6* 3.5* 3.5*  CL 103 103 107  CO2 19 19 21   BUN 3* <3* <3*  CREATININE 0.61 0.52 0.61  GLUCOSE 135* 96 109*   Electrolytes  Recent Labs Lab 10/07/13 1037   10/08/13 1010 10/08/13 2035 10/09/13 0305 10/10/13 0320  CALCIUM 7.7*  < > 7.2* 7.3*  --  7.2*  MG 1.2*  < > 1.9  --  1.7 1.6  PHOS 3.6  --   --   --  2.6 2.6  < > = values in this interval not displayed.  Sepsis Markers  Recent Labs Lab 10/07/13 1035 10/08/13 0133 10/08/13 2035  LATICACIDVEN 3.4* 0.8 1.5   Liver Enzymes  Recent Labs Lab 10/08/13 1010 10/09/13 0305 10/10/13 0320  AST 194* 153* 94*  ALT 82* 74* 57*  ALKPHOS 337* 292* 241*  BILITOT 3.3* 3.0* 2.9*  ALBUMIN 1.4* 1.5* 1.5*   Cardiac Enzymes  Recent Labs Lab 10/08/13 2035  TROPONINI <0.30   Glucose  Recent Labs Lab 10/05/13 2256 10/08/13 1908 10/09/13 0034 10/09/13 0354 10/09/13 1143  GLUCAP 138* 115* 89 78 343*    CXR:   No results found.  ASSESSMENT / PLAN:  NEUROLOGIC A:   Acute encephalopathy 2nd to ETOH withdrawal. Suicide attempt with intentional drug overdose. Hx of depression, seizures. P:   Continue precedex PRN ativan for CIWA > 8 Thiamine, folic acid, dextrose in IV fluid F/u with Psych Sitter at bedside  PULMONARY A:   Tobacco abuse. P:   Oxygen as needed to keep SpO2 > 92% Monitor for  aspiration PRN BD's  CARDIOVASCULAR A:  Sinus tachycardia. Hx of HTN. ?WPW >> not seen on ECG. P:  Monitor on tele Maintain hydration  RENAL A:   Hyponatremia >> improved. Hypokalemia, hypomagnesemia. P:   Continue IV fluids Monitor renal fx, urine outpt, and electrolytes  GASTROINTESTINAL A:   Elevated LFT's >> likely from ETOH >> improved. P:   NPO F/u LFT's intermittently Protonix for SUP  HEMATOLOGIC A:   Anemia 2nd to ETOH and iron deficiency. Thrombocytopenia 2nd to ETOH. P:  F/u CBC Transfuse for Hb < 7 Will need iron supplementation when take oral medications  INFECTIOUS A:   No acute issues. P:   Monitor for aspiration  ENDOCRINE  A:   No acute issues. P:   Monitor blood sugar on BMET  Updated mother about plan.  CC time 35  minutes.  Chesley Mires, MD [facility] Pulmonary/Critical Care 10/10/2013, 11:29 AM Pager:  [phone] After 3pm call: [phone]

## 2013-10-10 NOTE — Progress Notes (Signed)
Order received to transfuse 1 unit PRBC's; Patient unable to give consent for transfusion due to sedation;Attempted to obtain consent from Patient's mother Derreck Wiltsey and left messages at  [phone] (cell) and [phone] (home); Will notify MD regarding inability to obtain consent for transfusion.  Lenor Coffin, RN

## 2013-10-11 DIAGNOSIS — E43 Unspecified severe protein-calorie malnutrition: Secondary | ICD-10-CM

## 2013-10-11 LAB — BASIC METABOLIC PANEL
CALCIUM: 7.8 mg/dL — AB (ref 8.4–10.5)
CO2: 20 mEq/L (ref 19–32)
Chloride: 107 mEq/L (ref 96–112)
Creatinine, Ser: 0.46 mg/dL — ABNORMAL LOW (ref 0.50–1.35)
GFR calc Af Amer: >90 mL/min (ref >90)
Glucose, Bld: 105 mg/dL — ABNORMAL HIGH (ref 70–99)
Potassium: 4.1 mEq/L (ref 3.7–5.3)
Sodium: 138 mEq/L (ref 137–147)

## 2013-10-11 LAB — TYPE AND SCREEN
ABO/RH(D): B POS
Antibody Screen: NEGATIVE
UNIT DIVISION: 0

## 2013-10-11 LAB — CBC
HCT: 26.5 % — ABNORMAL LOW (ref 39.0–52.0)
HEMOGLOBIN: 8.5 g/dL — AB (ref 13.0–17.0)
MCH: 32.4 pg (ref 26.0–34.0)
MCHC: 32.1 g/dL (ref 30.0–36.0)
MCV: 101.1 fL — ABNORMAL HIGH (ref 78.0–100.0)
PLATELETS: 71 10*3/uL — AB (ref 150–400)
RBC: 2.62 MIL/uL — ABNORMAL LOW (ref 4.22–5.81)
RDW: 24.5 % — ABNORMAL HIGH (ref 11.5–15.5)
WBC: 7.7 10*3/uL (ref 4.0–10.5)

## 2013-10-11 LAB — MAGNESIUM: MAGNESIUM: 1.7 mg/dL (ref 1.5–2.5)

## 2013-10-11 MED ORDER — BIOTENE DRY MOUTH MT LIQD
15.0000 mL | Freq: Two times a day (BID) | OROMUCOSAL | Status: DC
  Administered 2013-10-11 – 2013-10-14 (×5): 15 mL via OROMUCOSAL

## 2013-10-11 MED ORDER — CHLORHEXIDINE GLUCONATE 0.12 % MT SOLN
15.0000 mL | Freq: Two times a day (BID) | OROMUCOSAL | Status: DC
  Administered 2013-10-12 – 2013-10-13 (×2): 15 mL via OROMUCOSAL
  Filled 2013-10-11: qty 15

## 2013-10-11 NOTE — Progress Notes (Signed)
PULMONARY / CRITICAL CARE MEDICINE  Name: [name] MRN: [id number] DOB: 28-Jul-1970    ADMISSION DATE:  10/05/2013  REFERRING MD :  TRH  CHIEF COMPLAINT:  Intentional Overdose  BRIEF PATIENT DESCRIPTION: 43 yo male brought to ED after intentionally overdose of flexeril, clonidine.  Transferred to ICU for precedex.  SIGNIFICANT EVENTS: 5/31 Admit, ETOH level 298, UDS negative 6/01 Psych consulted 6/02 Transfer to ICU, start precedex 6/04 Transfuse 1 unit PRBC  STUDIES:   LINES / TUBES: Foley 6/1 >>>  SUBJECTIVE:   Remains on precedex and in restraints.  VITAL SIGNS: Temp:  [97.2 F (36.2 C)-98.3 F (36.8 C)] 98 F (36.7 C) (06/05 0830) Pulse Rate:  [65-89] 65 (06/05 1000) Resp:  [18-42] 18 (06/05 1000) BP: (103-148)/(65-93) 103/72 mmHg (06/05 1000) SpO2:  [97 %-100 %] 99 % (06/05 1000)  INTAKE / OUTPUT: Intake/Output     06/04 0701 - 06/05 0700 06/05 0701 - 06/06 0700   I.V. (mL/kg) 1776.6 (30) 278.4 (4.7)   Blood 335    IV Piggyback 400    Total Intake(mL/kg) 2511.6 (42.4) 278.4 (4.7)   Urine (mL/kg/hr) 1925 (1.4) 675 (2.2)   Stool     Total Output 1925 675   Net +586.6 -396.6        Urine Occurrence 2 x      PHYSICAL EXAMINATION: General: ill appearing Neuro: RASS -3 HEENT: pupils reactive Cardiovascular: regular Lungs: no wheeze, scattered rhonchi Abdomen: soft, non tender Musculoskeletal: no edema Skin: no rashes  LABS: CBC  Recent Labs Lab 10/09/13 0305 10/10/13 0320 10/10/13 1400 10/11/13 0430  WBC 6.8 5.6  --  7.7  HGB 7.0* 6.5* 8.5* 8.5*  HCT 21.5* 20.4* 26.0* 26.5*  PLT 73* 67*  --  71*   Coag's  Recent Labs Lab 10/06/13 0600 10/07/13 1037 10/08/13 0132  APTT  --  34  --   INR 1.53* 1.70* 1.21   BMET  Recent Labs Lab 10/08/13 2035 10/10/13 0320 10/11/13 0430  NA 134* 137 138  K 3.5* 3.5* 4.1  CL 103 107 107  CO2 19 21 20   BUN <3* <3* <3*  CREATININE 0.52 0.61 0.46*  GLUCOSE 96 109* 105*    Electrolytes  Recent Labs Lab 10/07/13 1037  10/08/13 2035 10/09/13 0305 10/10/13 0320 10/11/13 0430  CALCIUM 7.7*  < > 7.3*  --  7.2* 7.8*  MG 1.2*  < >  --  1.7 1.6 1.7  PHOS 3.6  --   --  2.6 2.6  --   < > = values in this interval not displayed.  Sepsis Markers  Recent Labs Lab 10/07/13 1035 10/08/13 0133 10/08/13 2035  LATICACIDVEN 3.4* 0.8 1.5   Liver Enzymes  Recent Labs Lab 10/08/13 1010 10/09/13 0305 10/10/13 0320  AST 194* 153* 94*  ALT 82* 74* 57*  ALKPHOS 337* 292* 241*  BILITOT 3.3* 3.0* 2.9*  ALBUMIN 1.4* 1.5* 1.5*   Cardiac Enzymes  Recent Labs Lab 10/08/13 2035  TROPONINI <0.30   Glucose  Recent Labs Lab 10/05/13 2256 10/08/13 1908 10/09/13 0034 10/09/13 0354 10/09/13 1143  GLUCAP 138* 115* 89 78 343*    CXR:   No results found.  ASSESSMENT / PLAN:  NEUROLOGIC A:   Acute encephalopathy 2nd to ETOH withdrawal. Suicide attempt with intentional drug overdose. Hx of depression, seizures. P:   Continue precedex PRN ativan for CIWA > 8 Thiamine, folic acid, dextrose in IV fluid F/u with Psych >> will need inpt psych when medically  stable Sitter at bedside  PULMONARY A:   Tobacco abuse. P:   Oxygen as needed to keep SpO2 > 92% Monitor for aspiration PRN BD's F/u CXR 6/06  CARDIOVASCULAR A:  Sinus tachycardia >> improved 6/05. Hx of HTN. ?WPW prior to admission >> not seen on ECG. P:  Monitor on tele Maintain hydration  RENAL A:   Hyponatremia >> improved. Hypokalemia, hypomagnesemia. P:   Continue IV fluids Monitor renal fx, urine outpt, and electrolytes  GASTROINTESTINAL A:   Elevated LFT's >> likely from ETOH >> improved. Protein calorie malnutrition. P:   NPO >> difficult issue with nutritional support; may need panda for tube feeds, but doubt he would keep this in with present condition F/u LFT's intermittently Protonix for SUP  HEMATOLOGIC A:   Anemia 2nd to ETOH and iron  deficiency. Thrombocytopenia 2nd to ETOH. P:  F/u CBC Transfuse for Hb < 7 Will need iron supplementation when take oral medications  INFECTIOUS A:   No acute issues. P:   Monitor for aspiration  ENDOCRINE  A:   No acute issues. P:   Monitor blood sugar on BMET  CC time 35 minutes.  Chesley Mires, MD [facility] Pulmonary/Critical Care 10/11/2013, 12:18 PM Pager:  [phone] After 3pm call: [phone]

## 2013-10-11 NOTE — Clinical Social Work Psych Note (Signed)
Psych CSW continuing to follow.    Nonnie Done, [place] [phone]  Clinical Social Work

## 2013-10-12 ENCOUNTER — Inpatient Hospital Stay (HOSPITAL_COMMUNITY): Payer: Self-pay

## 2013-10-12 LAB — COMPREHENSIVE METABOLIC PANEL
ALT: 50 U/L (ref 0–53)
AST: 62 U/L — AB (ref 0–37)
Albumin: 1.6 g/dL — ABNORMAL LOW (ref 3.5–5.2)
Alkaline Phosphatase: 233 U/L — ABNORMAL HIGH (ref 39–117)
BUN: <3 mg/dL — ABNORMAL LOW (ref 6–23)
CALCIUM: 8.4 mg/dL (ref 8.4–10.5)
CO2: 21 meq/L (ref 19–32)
CREATININE: 0.52 mg/dL (ref 0.50–1.35)
Chloride: 107 mEq/L (ref 96–112)
GFR calc Af Amer: >90 mL/min (ref >90)
Glucose, Bld: 90 mg/dL (ref 70–99)
Potassium: 4.3 mEq/L (ref 3.7–5.3)
Sodium: 140 mEq/L (ref 137–147)
Total Bilirubin: 2.4 mg/dL — ABNORMAL HIGH (ref 0.3–1.2)
Total Protein: 6.6 g/dL (ref 6.0–8.3)

## 2013-10-12 LAB — CBC
HEMATOCRIT: 33.8 % — AB (ref 39.0–52.0)
Hemoglobin: 10.9 g/dL — ABNORMAL LOW (ref 13.0–17.0)
MCH: 32.5 pg (ref 26.0–34.0)
MCHC: 32.2 g/dL (ref 30.0–36.0)
MCV: 100.9 fL — ABNORMAL HIGH (ref 78.0–100.0)
PLATELETS: 73 10*3/uL — AB (ref 150–400)
RBC: 3.35 MIL/uL — ABNORMAL LOW (ref 4.22–5.81)
RDW: 23 % — AB (ref 11.5–15.5)
WBC: 8.6 10*3/uL (ref 4.0–10.5)

## 2013-10-12 LAB — MAGNESIUM: Magnesium: 1.6 mg/dL (ref 1.5–2.5)

## 2013-10-12 LAB — PHOSPHORUS: Phosphorus: 3.9 mg/dL (ref 2.3–4.6)

## 2013-10-12 MED ORDER — MAGNESIUM SULFATE 40 MG/ML IJ SOLN
2.0000 g | Freq: Once | INTRAMUSCULAR | Status: AC
  Administered 2013-10-12: 2 g via INTRAVENOUS
  Filled 2013-10-12: qty 50

## 2013-10-12 NOTE — Progress Notes (Signed)
PULMONARY / CRITICAL CARE MEDICINE  Name: [name] MRN: [id number] DOB: 12-Dec-1970    ADMISSION DATE:  10/05/2013  REFERRING MD :  TRH  CHIEF COMPLAINT:  Intentional Overdose  BRIEF PATIENT DESCRIPTION: 43 yo male brought to ED after intentionally overdose of flexeril, clonidine.  Transferred to ICU for precedex.  SIGNIFICANT EVENTS: 5/31 Admit, ETOH level 298, UDS negative 6/01 Psych consulted 6/02 Transfer to ICU, start precedex 6/04 Transfuse 1 unit PRBC 6/ in restraints and on precedex.  STUDIES:   LINES / TUBES: Foley 6/1 >>>  SUBJECTIVE:   Remains on precedex and in restraints.  VITAL SIGNS: Temp:  [97.3 F (36.3 C)] 97.3 F (36.3 C) (06/05 1100) Pulse Rate:  [60-81] 62 (06/06 0700) Resp:  [17-29] 21 (06/06 0700) BP: (88-157)/(72-102) 124/86 mmHg (06/06 0700) SpO2:  [94 %-100 %] 100 % (06/06 0700)  INTAKE / OUTPUT: Intake/Output     06/05 0701 - 06/06 0700 06/06 0701 - 06/07 0700   I.V. (mL/kg) 1591.6 (26.9)    Blood     IV Piggyback     Total Intake(mL/kg) 1591.6 (26.9)    Urine (mL/kg/hr) 2825 (2)    Total Output 2825     Net -1233.4            PHYSICAL EXAMINATION: General: ill appearing Neuro: RASS -3, sedated  HEENT: pupils reactive Cardiovascular: regular Lungs: no wheeze, scattered rhonchi Abdomen: soft, non tender Musculoskeletal: no edema Skin: no rashes  LABS: PULMONARY  Recent Labs Lab 10/05/13 2349  HCO3 19.0*  TCO2 20  O2SAT 89.0    CBC  Recent Labs Lab 10/10/13 0320 10/10/13 1400 10/11/13 0430 10/12/13 0324  HGB 6.5* 8.5* 8.5* 10.9*  HCT 20.4* 26.0* 26.5* 33.8*  WBC 5.6  --  7.7 8.6  PLT 67*  --  71* 73*    COAGULATION  Recent Labs Lab 10/06/13 0305 10/06/13 0600 10/07/13 1037 10/08/13 0132  INR 1.43 1.53* 1.70* 1.21    CARDIAC   Recent Labs Lab 10/08/13 2035  TROPONINI <0.30   No results found for this basename: PROBNP,  in the last 168 hours   CHEMISTRY  Recent Labs Lab  10/06/13 0600 10/07/13 1037  10/08/13 1010 10/08/13 2035 10/09/13 0305 10/10/13 0320 10/11/13 0430 10/12/13 0324  NA 133* 134*  < > 134* 134*  --  137 138 140  K 4.2 4.0  < > 3.6* 3.5*  --  3.5* 4.1 4.3  CL 101 98  < > 103 103  --  107 107 107  CO2 14* 18*  < > 19 19  --  21 20 21   GLUCOSE 137* 76  < > 135* 96  --  109* 105* 90  BUN 7 4*  < > 3* <3*  --  <3* <3* <3*  CREATININE 0.64 0.66  < > 0.61 0.52  --  0.61 0.46* 0.52  CALCIUM 7.3* 7.7*  < > 7.2* 7.3*  --  7.2* 7.8* 8.4  MG  --  1.2*  < > 1.9  --  1.7 1.6 1.7 1.6  PHOS  --  3.6  --   --   --  2.6 2.6  --  3.9  < > = values in this interval not displayed. Estimated Creatinine Clearance: 98.7 ml/min (by C-G formula based on Cr of 0.52).   LIVER  Recent Labs Lab 10/06/13 0305 10/06/13 0600 10/07/13 1037 10/08/13 0132 10/08/13 1010 10/09/13 0305 10/10/13 0320 10/12/13 0324  AST  --  261* 262*  219* 194* 153* 94* 62*  ALT  --  111* 109* 87* 82* 74* 57* 50  ALKPHOS  --  404* 418* 359* 337* 292* 241* 233*  BILITOT  --  2.0* 3.5* 3.4* 3.3* 3.0* 2.9* 2.4*  PROT  --  5.7* 6.2 5.8* 5.5* 5.7* 5.7* 6.6  ALBUMIN  --  1.6* 1.8* 1.6* 1.4* 1.5* 1.5* 1.6*  INR 1.43 1.53* 1.70* 1.21  --   --   --   --      INFECTIOUS  Recent Labs Lab 10/07/13 1035 10/08/13 0133 10/08/13 2035  LATICACIDVEN 3.4* 0.8 1.5     ENDOCRINE CBG (last 3)  No results found for this basename: GLUCAP,  in the last 72 hours       IMAGING x48h  Dg Chest Port 1 View  10/12/2013   CLINICAL DATA:  Pneumonia.  EXAM: PORTABLE CHEST - 1 VIEW  COMPARISON:  10/08/2013  FINDINGS: The cardiac silhouette, mediastinal and hilar contours are stable. Persistent but improved bilateral interstitial and airspace process likely atypical pneumonia. No pleural effusion or pneumothorax.  IMPRESSION: Persistent but improved bilateral interstitial and airspace process.   Electronically Signed   By: Kalman Jewels M.D.   On: 10/12/2013 07:02      ASSESSMENT /  PLAN:  NEUROLOGIC A:   Acute encephalopathy 2nd to ETOH withdrawal. Suicide attempt with intentional drug overdose. Hx of depression, seizures.   - still confused. RASS  +2 P:   Continue precedex PRN ativan for CIWA > 8 Thiamine, folic acid, dextrose in IV fluid F/u with Psych >> will need inpt psych when medically stable Sitter at bedside  PULMONARY A:   Tobacco abuse. P:   Oxygen as needed to keep SpO2 > 92% Monitor for aspiration PRN BD's F/u CXR 6/06  CARDIOVASCULAR A:  Sinus tachycardia >> improved 6/05. Hx of HTN. ?WPW prior to admission >> not seen on ECG. P:  Monitor on tele Maintain hydration  RENAL A:    hypomagnesemia. P:   Replete mag 10/12/13 Continue IV fluids Monitor renal fx, urine outpt, and electrolytes  GASTROINTESTINAL A:   Elevated LFT's >> likely from ETOH >> improved. Protein calorie malnutrition. P:   NPO >> difficult issue with nutritional support; may need panda for tube feeds, but doubt he would keep this in with present condition F/u LFT's intermittently Protonix for SUP  HEMATOLOGIC A:   Anemia 2nd to ETOH and iron deficiency. Thrombocytopenia 2nd to ETOH. P:  F/u CBC Transfuse for Hb < 7 Will need iron supplementation when take oral medications  INFECTIOUS A:   No acute issues. P:   Monitor for aspiration  ENDOCRINE  A:   No acute issues. P:   Monitor blood sugar on [facility] Minor ACNP Maryanna Shape PCCM Pager [phone] till 3 pm If no answer page [phone] 10/12/2013, 9:45 AM Pager:  [phone] After 3pm call: [phone]  STAFF NOTE  - personally seen patient. Correct mag. Continue precedex for acute encephalopathy. He is at high risk for deterioration and intubation and pneumonia and sepsis  CCM time x 30 minutes  Dr. Brand Males, M.D., [facility].C.P Pulmonary and Critical Care Medicine Staff Physician [place] Pulmonary and Critical Care Pager: [phone], If no answer or between   15:00h - 7:00h: call [phone]  10/12/2013 2:12 PM

## 2013-10-13 DIAGNOSIS — R109 Unspecified abdominal pain: Secondary | ICD-10-CM

## 2013-10-13 LAB — CBC
HCT: 31.4 % — ABNORMAL LOW (ref 39.0–52.0)
Hemoglobin: 10.4 g/dL — ABNORMAL LOW (ref 13.0–17.0)
MCH: 32.4 pg (ref 26.0–34.0)
MCHC: 33.1 g/dL (ref 30.0–36.0)
MCV: 97.8 fL (ref 78.0–100.0)
PLATELETS: 105 10*3/uL — AB (ref 150–400)
RBC: 3.21 MIL/uL — AB (ref 4.22–5.81)
RDW: 21 % — AB (ref 11.5–15.5)
WBC: 9.3 10*3/uL (ref 4.0–10.5)

## 2013-10-13 LAB — BASIC METABOLIC PANEL
BUN: <3 mg/dL — ABNORMAL LOW (ref 6–23)
CO2: 22 meq/L (ref 19–32)
Calcium: 8.6 mg/dL (ref 8.4–10.5)
Chloride: 106 mEq/L (ref 96–112)
Creatinine, Ser: 0.53 mg/dL (ref 0.50–1.35)
GFR calc Af Amer: >90 mL/min (ref >90)
GFR calc non Af Amer: >90 mL/min (ref >90)
Glucose, Bld: 88 mg/dL (ref 70–99)
Potassium: 4.1 mEq/L (ref 3.7–5.3)
Sodium: 140 mEq/L (ref 137–147)

## 2013-10-13 LAB — MAGNESIUM: Magnesium: 1.7 mg/dL (ref 1.5–2.5)

## 2013-10-13 LAB — PHOSPHORUS: PHOSPHORUS: 4.1 mg/dL (ref 2.3–4.6)

## 2013-10-13 MED ORDER — ACETAMINOPHEN 160 MG/5ML PO SOLN
650.0000 mg | Freq: Four times a day (QID) | ORAL | Status: DC | PRN
  Administered 2013-10-13 – 2013-10-19 (×13): 650 mg via ORAL
  Filled 2013-10-13 (×13): qty 20.3

## 2013-10-13 MED ORDER — SODIUM CHLORIDE 0.9 % IV BOLUS (SEPSIS)
1000.0000 mL | Freq: Once | INTRAVENOUS | Status: AC
  Administered 2013-10-13: 1000 mL via INTRAVENOUS

## 2013-10-13 NOTE — Progress Notes (Signed)
eLink Physician-Brief Progress Note Patient Name: [name] DOB: Oct 18, 1970 MRN: [id number]  Date of Service  10/13/2013   HPI/Events of Note  Off precedex , calm and sleeping. But tachycardic, sinus with RR 30 but even and smooth   eICU Interventions  1 L fluid bolus and reassess   Intervention Category Intermediate Interventions: Other:  Brand Males 10/13/2013, 11:36 PM

## 2013-10-13 NOTE — Progress Notes (Signed)
PULMONARY / CRITICAL CARE MEDICINE  Name: [name] MRN: [id number] DOB: 1971-02-13    ADMISSION DATE:  10/05/2013  REFERRING MD :  TRH  CHIEF COMPLAINT:  Intentional Overdose  BRIEF PATIENT DESCRIPTION: 43 yo male brought to ED after intentionally overdose of flexeril, clonidine.  Transferred to ICU for precedex.  SIGNIFICANT EVENTS: 5/31 Admit, ETOH level 298, UDS negative 6/01 Psych consulted 6/02 Transfer to ICU, start precedex 6/04 Transfuse 1 unit PRBC 6/6 in restraints and on precedex. 6/7 dc precedex  STUDIES:   LINES / TUBES: Foley 6/1 >>>  SUBJECTIVE:   Remains on precedex and in restraints.  VITAL SIGNS: Temp:  [97.4 F (36.3 C)-98.8 F (37.1 C)] 97.4 F (36.3 C) (06/07 0424) Pulse Rate:  [55-80] 67 (06/07 0700) Resp:  [16-28] 28 (06/07 0700) BP: (113-167)/(81-99) 162/90 mmHg (06/07 0700) SpO2:  [86 %-100 %] 100 % (06/07 0700)  INTAKE / OUTPUT: Intake/Output     06/06 0701 - 06/07 0700 06/07 0701 - 06/08 0700   I.V. (mL/kg) 2227.2 (37.6)    Total Intake(mL/kg) 2227.2 (37.6)    Urine (mL/kg/hr) 1850 (1.3)    Stool 400 (0.3)    Total Output 2250     Net -22.8            PHYSICAL EXAMINATION: General: ill appearing Neuro: RASS -3, sedated  HEENT: pupils reactive Cardiovascular: regular Lungs: no wheeze, scattered rhonchi Abdomen: soft, non tender Musculoskeletal: no edema Skin: no rashes  LABS: PULMONARY No results found for this basename: PHART, PCO2, PCO2ART, PO2, PO2ART, HCO3, TCO2, O2SAT,  in the last 168 hours  CBC  Recent Labs Lab 10/11/13 0430 10/12/13 0324 10/13/13 0259  HGB 8.5* 10.9* 10.4*  HCT 26.5* 33.8* 31.4*  WBC 7.7 8.6 9.3  PLT 71* 73* 105*    COAGULATION  Recent Labs Lab 10/07/13 1037 10/08/13 0132  INR 1.70* 1.21    CARDIAC    Recent Labs Lab 10/08/13 2035  TROPONINI <0.30   No results found for this basename: PROBNP,  in the last 168 hours   CHEMISTRY  Recent Labs Lab 10/07/13 1037   10/08/13 2035 10/09/13 0305 10/10/13 0320 10/11/13 0430 10/12/13 0324 10/13/13 0259  NA 134*  < > 134*  --  137 138 140 140  K 4.0  < > 3.5*  --  3.5* 4.1 4.3 4.1  CL 98  < > 103  --  107 107 107 106  CO2 18*  < > 19  --  21 20 21 22   GLUCOSE 76  < > 96  --  109* 105* 90 88  BUN 4*  < > <3*  --  <3* <3* <3* <3*  CREATININE 0.66  < > 0.52  --  0.61 0.46* 0.52 0.53  CALCIUM 7.7*  < > 7.3*  --  7.2* 7.8* 8.4 8.6  MG 1.2*  < >  --  1.7 1.6 1.7 1.6 1.7  PHOS 3.6  --   --  2.6 2.6  --  3.9 4.1  < > = values in this interval not displayed. Estimated Creatinine Clearance: 98.7 ml/min (by C-G formula based on Cr of 0.53).   LIVER  Recent Labs Lab 10/07/13 1037 10/08/13 0132 10/08/13 1010 10/09/13 0305 10/10/13 0320 10/12/13 0324  AST 262* 219* 194* 153* 94* 62*  ALT 109* 87* 82* 74* 57* 50  ALKPHOS 418* 359* 337* 292* 241* 233*  BILITOT 3.5* 3.4* 3.3* 3.0* 2.9* 2.4*  PROT 6.2 5.8* 5.5* 5.7* 5.7* 6.6  ALBUMIN 1.8* 1.6* 1.4* 1.5* 1.5* 1.6*  INR 1.70* 1.21  --   --   --   --      INFECTIOUS  Recent Labs Lab 10/07/13 1035 10/08/13 0133 10/08/13 2035  LATICACIDVEN 3.4* 0.8 1.5     ENDOCRINE CBG (last 3)  No results found for this basename: GLUCAP,  in the last 72 hours       IMAGING x48h  Dg Chest Port 1 View  10/12/2013   CLINICAL DATA:  Pneumonia.  EXAM: PORTABLE CHEST - 1 VIEW  COMPARISON:  10/08/2013  FINDINGS: The cardiac silhouette, mediastinal and hilar contours are stable. Persistent but improved bilateral interstitial and airspace process likely atypical pneumonia. No pleural effusion or pneumothorax.  IMPRESSION: Persistent but improved bilateral interstitial and airspace process.   Electronically Signed   By: Kalman Jewels M.D.   On: 10/12/2013 07:02      ASSESSMENT / PLAN:  NEUROLOGIC A:   Acute encephalopathy 2nd to ETOH withdrawal. Suicide attempt with intentional drug overdose. Hx of depression, seizures.   P:   dc precedex 6/7 PRN  ativan for CIWA > 8 Thiamine, folic acid, dextrose in IV fluid F/u with Psych >> will need inpt psych when medically stable Sitter at bedside  PULMONARY A:   Tobacco abuse. P:   Oxygen as needed to keep SpO2 > 92% Monitor for aspiration PRN BD's F/u CXR 6/06  CARDIOVASCULAR A:  Sinus tachycardia >> improved 6/05. Hx of HTN. ?WPW prior to admission >> not seen on ECG. P:  Monitor on tele Maintain hydration  RENAL A:    hypomagnesemia. P:   Replete mag 10/12/13 Continue IV fluids Monitor renal fx, urine outpt, and electrolytes  GASTROINTESTINAL A:   Elevated LFT's >> likely from ETOH >> improved. Protein calorie malnutrition. P:   Waking up, try ice chips at first and advance diet as improves Protonix for SUP  HEMATOLOGIC A:   Anemia 2nd to ETOH and iron deficiency. Thrombocytopenia 2nd to ETOH. P:  F/u CBC Transfuse for Hb < 7 Will need iron supplementation when take oral medications  INFECTIOUS A:   No acute issues. P:   Monitor for aspiration  ENDOCRINE  A:   No acute issues. P:   Monitor blood sugar on BMET    Steve Minor ACNP Maryanna Shape PCCM Pager [phone] till 3 pm If no answer page [phone] 10/13/2013, 9:02 AM Pager:  [phone] After 3pm call: [phone]   Attending:  I have seen and examined the patient with nurse practitioner/resident and agree with the note above.   Waking up, off precedex Ice chips  Floor vs SDU tomorrow  Roselie Awkward, MD [place] PCCM Pager: [phone] Cell: [phone] If no response, call [phone]

## 2013-10-14 ENCOUNTER — Inpatient Hospital Stay (HOSPITAL_COMMUNITY): Payer: Self-pay

## 2013-10-14 DIAGNOSIS — K861 Other chronic pancreatitis: Secondary | ICD-10-CM

## 2013-10-14 LAB — COMPREHENSIVE METABOLIC PANEL
ALBUMIN: 1.7 g/dL — AB (ref 3.5–5.2)
ALT: 41 U/L (ref 0–53)
AST: 50 U/L — AB (ref 0–37)
Alkaline Phosphatase: 215 U/L — ABNORMAL HIGH (ref 39–117)
BUN: 3 mg/dL — ABNORMAL LOW (ref 6–23)
CALCIUM: 8.2 mg/dL — AB (ref 8.4–10.5)
CHLORIDE: 104 meq/L (ref 96–112)
CO2: 21 mEq/L (ref 19–32)
CREATININE: 0.63 mg/dL (ref 0.50–1.35)
GFR calc Af Amer: >90 mL/min (ref >90)
GFR calc non Af Amer: >90 mL/min (ref >90)
Glucose, Bld: 94 mg/dL (ref 70–99)
Potassium: 3.9 mEq/L (ref 3.7–5.3)
Sodium: 138 mEq/L (ref 137–147)
TOTAL PROTEIN: 6.5 g/dL (ref 6.0–8.3)
Total Bilirubin: 1.4 mg/dL — ABNORMAL HIGH (ref 0.3–1.2)

## 2013-10-14 LAB — LIPASE, BLOOD: LIPASE: 239 U/L — AB (ref 11–59)

## 2013-10-14 LAB — CBC
HCT: 26 % — ABNORMAL LOW (ref 39.0–52.0)
Hemoglobin: 8.5 g/dL — ABNORMAL LOW (ref 13.0–17.0)
MCH: 31.8 pg (ref 26.0–34.0)
MCHC: 32.7 g/dL (ref 30.0–36.0)
MCV: 97.4 fL (ref 78.0–100.0)
PLATELETS: 123 10*3/uL — AB (ref 150–400)
RBC: 2.67 MIL/uL — ABNORMAL LOW (ref 4.22–5.81)
RDW: 20.1 % — ABNORMAL HIGH (ref 11.5–15.5)
WBC: 12.9 10*3/uL — AB (ref 4.0–10.5)

## 2013-10-14 LAB — MAGNESIUM: Magnesium: 1.4 mg/dL — ABNORMAL LOW (ref 1.5–2.5)

## 2013-10-14 LAB — PHOSPHORUS: Phosphorus: 3.6 mg/dL (ref 2.3–4.6)

## 2013-10-14 MED ORDER — LORAZEPAM 2 MG/ML IJ SOLN
1.0000 mg | Freq: Four times a day (QID) | INTRAMUSCULAR | Status: DC | PRN
  Administered 2013-10-14 – 2013-10-17 (×4): 1 mg via INTRAVENOUS
  Filled 2013-10-14 (×4): qty 1

## 2013-10-14 MED ORDER — VITAMIN B-1 100 MG PO TABS
100.0000 mg | ORAL_TABLET | Freq: Every day | ORAL | Status: DC
  Administered 2013-10-14 – 2013-10-22 (×9): 100 mg via ORAL
  Filled 2013-10-14 (×9): qty 1

## 2013-10-14 MED ORDER — BISMUTH SUBSALICYLATE 262 MG/15ML PO SUSP
30.0000 mL | ORAL | Status: DC | PRN
  Administered 2013-10-21: 30 mL via ORAL
  Filled 2013-10-14 (×2): qty 236

## 2013-10-14 MED ORDER — MAGNESIUM SULFATE 40 MG/ML IJ SOLN
2.0000 g | Freq: Once | INTRAMUSCULAR | Status: AC
  Administered 2013-10-14: 2 g via INTRAVENOUS
  Filled 2013-10-14: qty 50

## 2013-10-14 MED ORDER — PANTOPRAZOLE SODIUM 40 MG PO TBEC
40.0000 mg | DELAYED_RELEASE_TABLET | Freq: Every day | ORAL | Status: DC
  Administered 2013-10-14 – 2013-10-21 (×8): 40 mg via ORAL
  Filled 2013-10-14 (×8): qty 1

## 2013-10-14 MED ORDER — FOLIC ACID 1 MG PO TABS
1.0000 mg | ORAL_TABLET | Freq: Every day | ORAL | Status: DC
  Administered 2013-10-14 – 2013-10-22 (×9): 1 mg via ORAL
  Filled 2013-10-14 (×9): qty 1

## 2013-10-14 MED ORDER — METOPROLOL TARTRATE 1 MG/ML IV SOLN
2.5000 mg | Freq: Once | INTRAVENOUS | Status: AC
  Administered 2013-10-14: 2.5 mg via INTRAVENOUS

## 2013-10-14 MED ORDER — METOPROLOL TARTRATE 1 MG/ML IV SOLN
2.5000 mg | Freq: Once | INTRAVENOUS | Status: AC
  Administered 2013-10-14: 2.5 mg via INTRAVENOUS
  Filled 2013-10-14: qty 5

## 2013-10-14 NOTE — Progress Notes (Signed)
NUTRITION FOLLOW UP  Intervention:   Continue Regular diet to meet patient's nutrition needs.  Nutrition Dx:   Inadequate oral intake related to inability to eat as evidenced by NPO status, resolved.  No new nutrition dx at this time.  Goal:   Intake to meet >90% of estimated nutrition needs, met.  Monitor:   PO intake, labs, weight trend.  Assessment:   43 y.o. M brought to ED 5/31 after he intentionally overdosed on flexeril and clonodine as part of suicide attempt. Was admitted by [facility]; however, became more agitated/combative. On 6/2, PCCM was consulted for transfer to ICU and initiation of precedex.  Pt meets criteria for severe MALNUTRITION in the context of chronic illness as evidenced by severe depletion of muscle mass and 12% weight loss within one month.  Diet has been advanced to regular diet. RN reports that patient has been eating 100% of meals. This would meet nutrition needs. No supplements needed at this time.  Height: Ht Readings from Last 1 Encounters:  10/06/13 $RemoveB'5\' 9"'ckUNFejt$  (1.753 m)    Weight Status:   Wt Readings from Last 1 Encounters:  10/06/13 130 lb 8.2 oz (59.2 kg)    Re-estimated needs:  Kcal: 1900-2100  Protein: 90-110 gm  Fluid: 2 L  Skin: intact  Diet Order: General   Intake/Output Summary (Last 24 hours) at 10/14/13 1154 Last data filed at 10/14/13 0918  Gross per 24 hour  Intake   2425 ml  Output   1200 ml  Net   1225 ml    Last BM: 6/6   Labs:   Recent Labs Lab 10/12/13 0324 10/13/13 0259 10/14/13 0220 10/14/13 1010  NA 140 140  --  138  K 4.3 4.1  --  3.9  CL 107 106  --  104  CO2 21 22  --  21  BUN <3* <3*  --  3*  CREATININE 0.52 0.53  --  0.63  CALCIUM 8.4 8.6  --  8.2*  MG 1.6 1.7 1.4*  --   PHOS 3.9 4.1 3.6  --   GLUCOSE 90 88  --  94    CBG (last 3)  No results found for this basename: GLUCAP,  in the last 72 hours  Scheduled Meds: . antiseptic oral rinse  15 mL Mouth Rinse [id number]  . folic acid  1 mg Oral  Daily  . magnesium sulfate 1 - 4 g bolus IVPB  2 g Intravenous Once  . multivitamin with minerals  1 tablet Oral Daily  . pantoprazole  40 mg Oral QHS  . thiamine  100 mg Oral Daily    Continuous Infusions: . dextrose 5 % and 0.9 % NaCl with KCl 20 mEq/L 75 mL/hr at 10/13/13 [place], RD, LDN, [place] Pager [phone] After Hours Pager [phone]

## 2013-10-14 NOTE — Progress Notes (Signed)
eLink Physician-Brief Progress Note Patient Name: [name] DOB: 21-Mar-1971 MRN: [id number]  Date of Service  10/14/2013   HPI/Events of Note   Still tachy hr 140, ekg confirms sinus. Temp 100F. No response to fluid bolus  eICU Interventions  Try temp control with tylenol and reassess   Intervention Category Intermediate Interventions: Arrhythmia - evaluation and management  Murtaza Shell 10/14/2013, 1:37 AM

## 2013-10-14 NOTE — Progress Notes (Signed)
PULMONARY / CRITICAL CARE MEDICINE  Name: [name] MRN: [id number] DOB: [birth year]    ADMISSION DATE:  10/05/2013  REFERRING MD :  TRH  CHIEF COMPLAINT:  Intentional Overdose  BRIEF PATIENT DESCRIPTION: 43 yo male brought to ED after intentionally overdose of flexeril, clonidine.  Transferred to ICU for precedex.  SIGNIFICANT EVENTS: 5/31 Admit, ETOH level 298, UDS negative 6/01 Psych consulted 6/02 Transfer to ICU, start precedex 6/04 Transfuse 1 unit PRBC 6/6 in restraints and on precedex. 6/7 dc precedex  STUDIES:   LINES / TUBES: Foley 6/1 >>>  SUBJECTIVE:   Mental status better Eating> now with belly pain  VITAL SIGNS: Temp:  [98.5 F (36.9 C)-99.9 F (37.7 C)] 98.8 F (37.1 C) (06/08 0836) Pulse Rate:  [63-151] 111 (06/08 0700) Resp:  [16-39] 29 (06/08 0700) BP: (99-135)/(57-95) 131/72 mmHg (06/08 0700) SpO2:  [91 %-100 %] 100 % (06/08 0700)  INTAKE / OUTPUT: Intake/Output     06/07 0701 - 06/08 0700 06/08 0701 - 06/09 0700   I.V. (mL/kg) 1751.6 (29.6)    IV Piggyback 1000    Total Intake(mL/kg) 2751.6 (46.5)    Urine (mL/kg/hr) 1425 (1)    Stool     Total Output 1425     Net +1326.6          Urine Occurrence 2 x    Stool Occurrence 4 x      PHYSICAL EXAMINATION:  Gen: awake alert conversant HEENT: NCAT, EOMi, OP clear PULM: CTA B CV: RRR, no mgr, no JVD AB: BS+, soft, mildly tender, no guarding or rebound Ext: warm, no edema, no clubbing, no cyanosis Derm: no rash or skin breakdown Neuro: Awake and alert, conversant, oriented x3, following commands   LABS: PULMONARY No results found for this basename: PHART, PCO2, PCO2ART, PO2, PO2ART, HCO3, TCO2, O2SAT,  in the last 168 hours  CBC  Recent Labs Lab 10/11/13 0430 10/12/13 0324 10/13/13 0259  HGB 8.5* 10.9* 10.4*  HCT 26.5* 33.8* 31.4*  WBC 7.7 8.6 9.3  PLT 71* 73* 105*    COAGULATION  Recent Labs Lab 10/07/13 1037 10/08/13 0132  INR 1.70* 1.21    CARDIAC     Recent Labs Lab 10/08/13 2035  TROPONINI <0.30   No results found for this basename: PROBNP,  in the last 168 hours   CHEMISTRY  Recent Labs Lab 10/08/13 2035 10/09/13 0305 10/10/13 0320 10/11/13 0430 10/12/13 0324 10/13/13 0259 10/14/13 0220  NA 134*  --  137 138 140 140  --   K 3.5*  --  3.5* 4.1 4.3 4.1  --   CL 103  --  107 107 107 106  --   CO2 19  --  21 20 21 22   --   GLUCOSE 96  --  109* 105* 90 88  --   BUN <3*  --  <3* <3* <3* <3*  --   CREATININE 0.52  --  0.61 0.46* 0.52 0.53  --   CALCIUM 7.3*  --  7.2* 7.8* 8.4 8.6  --   MG  --  1.7 1.6 1.7 1.6 1.7 1.4*  PHOS  --  2.6 2.6  --  3.9 4.1 3.6   Estimated Creatinine Clearance: 98.7 ml/min (by C-G formula based on Cr of 0.53).   LIVER  Recent Labs Lab 10/07/13 1037 10/08/13 0132 10/08/13 1010 10/09/13 0305 10/10/13 0320 10/12/13 0324  AST 262* 219* 194* 153* 94* 62*  ALT 109* 87* 82* 74* 57* 50  ALKPHOS  418* 359* 337* 292* 241* 233*  BILITOT 3.5* 3.4* 3.3* 3.0* 2.9* 2.4*  PROT 6.2 5.8* 5.5* 5.7* 5.7* 6.6  ALBUMIN 1.8* 1.6* 1.4* 1.5* 1.5* 1.6*  INR 1.70* 1.21  --   --   --   --      INFECTIOUS  Recent Labs Lab 10/07/13 1035 10/08/13 0133 10/08/13 2035  LATICACIDVEN 3.4* 0.8 1.5     ENDOCRINE CBG (last 3)  No results found for this basename: GLUCAP,  in the last 72 hours       IMAGING x48h  No results found.    ASSESSMENT / PLAN:  NEUROLOGIC A:   Acute encephalopathy 2nd to ETOH withdrawal. > resolved Suicide attempt with intentional drug overdose. Hx of depression, seizures.   P:   PRN ativan for CIWA > 8 Thiamine, folic acid, dextrose in IV fluid F/u with Psych >> will need inpt psych when medically stable Sitter at bedside  PULMONARY A:   Tobacco abuse. P:   Oxygen as needed to keep SpO2 > 92% PRN BD's  CARDIOVASCULAR A:  Sinus tachycardia >> due to pain from pancreatitis Hx of HTN.  P:  Monitor on tele Maintain hydration D/C echo  RENAL A:     No acute issues P:   Continue IV fluids Monitor renal fx, urine outpt, and electrolytes  GASTROINTESTINAL A:   Acute alcoholic hepatitis > resolved Chronic pancreatitis > flared 6/8 AM with food Protein calorie malnutrition. P:   Back to clears Cont IVF Continue protonix  HEMATOLOGIC A:   Anemia 2nd to ETOH and iron deficiency. Thrombocytopenia 2nd to ETOH. P:  F/u CBC Transfuse for Hb < 7 Will need iron supplementation when take oral medications  INFECTIOUS A:   No acute issues. P:   Monitor for aspiration  ENDOCRINE  A:   No acute issues. P:   Monitor blood sugar on BMET   Floor today, PCCM off 6/9  Roselie Awkward, MD [facility] Pager: [phone] Cell: [phone] If no response, call [phone]

## 2013-10-14 NOTE — Progress Notes (Signed)
[facility] Progress Note Patient Name: [name] DOB: 03/25/1971 MRN: [id number]  Date of Service  10/14/2013   HPI/Events of Note   HR 140-160 despite fluid and tylenol  eICU Interventions  Lopressor 2.5mg  IV x 1 Reassess Consider cardizem gtt   Intervention Category Intermediate Interventions: Arrhythmia - evaluation and management  Brand Males 10/14/2013, 2:36 AM

## 2013-10-14 NOTE — Progress Notes (Signed)
[facility] Progress Note Patient Name: [name] DOB: 05-19-1970 MRN: [id number]  Date of Service  10/14/2013   HPI/Events of Note   Tachycardia responded to prn lopressor  x1 but now back  eICU Interventions  rechallengelopressors Get echo  Bedside MD to eval    Intervention Category Intermediate Interventions: Arrhythmia - evaluation and management  Sagan Maselli 10/14/2013, 6:04 AM

## 2013-10-15 MED ORDER — METOPROLOL TARTRATE 25 MG PO TABS
25.0000 mg | ORAL_TABLET | Freq: Two times a day (BID) | ORAL | Status: DC
  Administered 2013-10-15 (×2): 25 mg via ORAL
  Filled 2013-10-15 (×5): qty 1

## 2013-10-15 MED ORDER — LORAZEPAM 2 MG/ML IJ SOLN
2.0000 mg | Freq: Once | INTRAMUSCULAR | Status: DC
  Filled 2013-10-15 (×2): qty 1

## 2013-10-15 MED ORDER — LORAZEPAM 2 MG/ML IJ SOLN: 2.0000 mg | Freq: Once | INTRAMUSCULAR | Status: DC

## 2013-10-15 MED ORDER — ENOXAPARIN SODIUM 40 MG/0.4ML ~~LOC~~ SOLN
40.0000 mg | SUBCUTANEOUS | Status: DC
  Administered 2013-10-15 – 2013-10-22 (×6): 40 mg via SUBCUTANEOUS
  Filled 2013-10-15 (×9): qty 0.4

## 2013-10-15 MED ORDER — MAGNESIUM SULFATE 40 MG/ML IJ SOLN
2.0000 g | Freq: Once | INTRAMUSCULAR | Status: AC
  Administered 2013-10-15: 2 g via INTRAVENOUS
  Filled 2013-10-15: qty 50

## 2013-10-15 NOTE — Consult Note (Signed)
[facility] Face-to-Face Psychiatry Consult   Reason for Consult:  Alcohol intoxication and suicide attempt Referring Physician:  Jonetta Osgood, MD  [place] is [place] 43 y.o. male. Total Time spent with patient: 45 minutes  Assessment: AXIS I:  Major Depression, Recurrent severe, Substance Induced Mood Disorder and Alcohol dependence AXIS II:  Deferred AXIS III:   Past Medical History  Diagnosis Date  . Hypertension   . Asthma   . Pancreatitis   . Cocaine abuse   . Depression   . H/O suicide attempt   . Heart murmur     "when he was little" (03/06/2013)  . Shortness of breath     "can happen at anytime" (03/06/2013)  . Anemia   . H/O hiatal hernia   . GERD (gastroesophageal reflux disease)   . Seizures     "weekly" (03/06/2013)  . Arthritis     "knees" (03/06/2013)  . Anxiety   . WPW (Wolff-Parkinson-White syndrome)     [name] 03/06/2013   AXIS IV:  economic problems, occupational problems, other psychosocial or environmental problems, problems related to social environment and problems with primary support group AXIS V:  41-50 serious symptoms  Plan:  Case discussed with Dr.Shanker Kristeen Mans, MD, who stated patient will be medically cleared by tomorrow Information given to [place], [facility]. [facility] hospital for possible admission to [place] [facility] admission when medically cleared. Supportive therapy provided about ongoing stressors. Continuous safety sitter and CIWA protocol and continue current medication management  Subjective:   [name] is a 43 y.o. male patient admitted with suicide attempt and alcohol intoxication.  HPI:  Patient was seen and chart reviewed. Psychiatric consultation was requested a suicide attempt while intoxicated. This is a 43 yo male with history of alcohol abuse versus dependence, elevated liver enzymes, depression/anxiety comes in after intentionally ingesting some of his home meds to kill himself per intial report he  told the emergency department on arrival. Patient was disheveled, poorly groomed and denies current symptoms of depression, anxiety and psychosis. Patient was placed in the 4 point restraints as he is being combative and removing catheters and intravenous tubes. He tells me he did this to try to get high off of the flexeril. He says he took 15 pills, combination of some of his flexeril and some of his clonidine. His girlfriend is pregnant and is planning on getting an abortion tomorrow, and they were argueing about that, and he got very upset and took a bunch of his pills. This occurred at about 10pm. He received charcoal in the emergency department. He also reports he has been drinking beer.   Interval history: The patient was seen today for psychiatric consultation followup as he has been depressed, sad, tearful, lack of energy and motivation and being tired all the time. Patient continue to endorse symptoms of suicidal ideation, thoughts, status post suicide attempt but denies current suicide intent and plans. Patient reportedly has conflict with his baby's mother who was pregnant at 4 months gestation. Patient mother is living in [place] and came to here and spend a few months the lost 6 months to one year. Reportedly patient met her on Facebook. Patient reported he stopped drinking 3 weeks because of her telling him. Patient stated that his baby his mother threatened about going for an abortion because she does not believe patient can support the pregnancy and the baby. Patient reported he feels depressed about not having the job and finances to support them. 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 Patient requesting treatment for depression, anxiety and also interested to participate alcohol rehabilitation treatment at [facility] [employer] recovery service if possible. Patient family especially mother and father and a sister visiting him while in the hospital. Patient has completed  alcohol detox treatment with precedex. His blood alcohol level in the emergency department his 298 on arrival. Patient had alcohol detox, rehabilitation treatment 2012 at [facility] which was [facility].   HPI Elements:   Location:  Depression and alcohol abuse. Quality:  poor. Severity:  Acute. Timing:  Suicide attempt and intoxication.  Past Psychiatric History: Past Medical History  Diagnosis Date  . Hypertension   . Asthma   . Pancreatitis   . Cocaine abuse   . Depression   . H/O suicide attempt   . Heart murmur     "when he was little" (03/06/2013)  . Shortness of breath     "can happen at anytime" (03/06/2013)  . Anemia   . H/O hiatal hernia   . GERD (gastroesophageal reflux disease)   . Seizures     "weekly" (03/06/2013)  . Arthritis     "knees" (03/06/2013)  . Anxiety   . WPW (Wolff-Parkinson-White syndrome)     [name] 03/06/2013    reports that he has been smoking Cigarettes.  He has a 30 pack-year smoking history. He uses smokeless tobacco. He reports that he drinks about 25.2 ounces of alcohol per week. He reports that he uses illicit drugs (Cocaine and Marijuana). Family History  Problem Relation Age of Onset  . Hypertension Other   . Coronary artery disease Other      Living Arrangements: Parent     Allergies:   Allergies  Allergen Reactions  . Trazodone And Nefazodone Other (See Comments)    Muscle spasms  . Shellfish-Derived Products Nausea And Vomiting    ACT Assessment Complete:  No Objective: Blood pressure 148/90, pulse 120, temperature 98.8 F (37.1 C), temperature source Oral, resp. rate 20, height $RemoveBe'5\' 9"'CuZHrDjPG$  (1.753 m), weight 59.2 kg (130 lb 8.2 oz), SpO2 99.00%.Body mass index is 19.26 kg/(m^2). Results for orders placed during the hospital encounter of 10/05/13 (from the past 72 hour(s))  CBC     Status: Abnormal   Collection Time    10/13/13  2:59 AM      Result Value Ref Range   WBC 9.3  4.0 - 10.5 K/uL   RBC 3.21 (*)  4.22 - 5.81 MIL/uL   Hemoglobin 10.4 (*) 13.0 - 17.0 g/dL   HCT 31.4 (*) 39.0 - 52.0 %   MCV 97.8  78.0 - 100.0 fL   MCH 32.4  26.0 - 34.0 pg   MCHC 33.1  30.0 - 36.0 g/dL   RDW 21.0 (*) 11.5 - 15.5 %   Platelets 105 (*) 150 - 400 K/uL   Comment: CONSISTENT WITH PREVIOUS RESULT     DELTA CHECK NOTED  BASIC METABOLIC PANEL     Status: Abnormal   Collection Time    10/13/13  2:59 AM      Result Value Ref Range   Sodium 140  137 - 147 mEq/L   Potassium 4.1  3.7 - 5.3 mEq/L   Chloride 106  96 - 112 mEq/L   CO2 22  19 - 32 mEq/L   Glucose, Bld 88  70 - 99 mg/dL   BUN <3 (*) 6 - 23 mg/dL   Creatinine, Ser 0.53  0.50 - 1.35 mg/dL   Calcium 8.6  8.4 - 10.5 mg/dL   GFR calc non Af Amer >90  >90 mL/min   GFR calc Af Amer >90  >90 mL/min   Comment: (NOTE)     The eGFR has been calculated using the CKD EPI equation.     This calculation has not been validated in all clinical situations.     eGFR's persistently <90 mL/min signify possible Chronic Kidney     Disease.  MAGNESIUM     Status: None   Collection Time    10/13/13  2:59 AM      Result Value Ref Range   Magnesium 1.7  1.5 - 2.5 mg/dL  PHOSPHORUS     Status: None   Collection Time    10/13/13  2:59 AM      Result Value Ref Range   Phosphorus 4.1  2.3 - 4.6 mg/dL  MAGNESIUM     Status: Abnormal   Collection Time    10/14/13  2:20 AM      Result Value Ref Range   Magnesium 1.4 (*) 1.5 - 2.5 mg/dL  PHOSPHORUS     Status: None   Collection Time    10/14/13  2:20 AM      Result Value Ref Range   Phosphorus 3.6  2.3 - 4.6 mg/dL  CBC     Status: Abnormal   Collection Time    10/14/13 10:10 AM      Result Value Ref Range   WBC 12.9 (*) 4.0 - 10.5 K/uL   Comment: REPEATED TO VERIFY   RBC 2.67 (*) 4.22 - 5.81 MIL/uL   Hemoglobin 8.5 (*) 13.0 - 17.0 g/dL   Comment: REPEATED TO VERIFY   HCT 26.0 (*) 39.0 - 52.0 %   MCV 97.4  78.0 - 100.0 fL   Comment: CONSISTENT WITH PREVIOUS RESULT   MCH 31.8  26.0 - 34.0 pg   MCHC 32.7   30.0 - 36.0 g/dL   RDW 20.1 (*) 11.5 - 15.5 %   Platelets 123 (*) 150 - 400 K/uL   Comment: REPEATED TO VERIFY  COMPREHENSIVE METABOLIC PANEL     Status: Abnormal   Collection Time    10/14/13 10:10 AM      Result Value Ref Range   Sodium 138  137 - 147 mEq/L   Potassium 3.9  3.7 - 5.3 mEq/L   Chloride 104  96 - 112 mEq/L   CO2 21  19 - 32 mEq/L   Glucose, Bld 94  70 - 99 mg/dL   BUN 3 (*) 6 - 23 mg/dL   Creatinine, Ser 0.63  0.50 - 1.35 mg/dL   Calcium 8.2 (*) 8.4 - 10.5 mg/dL   Total Protein 6.5  6.0 - 8.3 g/dL   Albumin 1.7 (*) 3.5 - 5.2 g/dL   AST 50 (*) 0 - 37 U/L   ALT 41  0 - 53 U/L   Alkaline Phosphatase 215 (*) 39 - 117 U/L   Total Bilirubin 1.4 (*) 0.3 - 1.2 mg/dL   GFR calc non Af Amer >90  >90 mL/min   GFR calc Af Amer >90  >90 mL/min   Comment: (NOTE)     The eGFR has been calculated using the CKD EPI equation.     This calculation has not been validated in all clinical situations.     eGFR's persistently <90 mL/min signify possible Chronic Kidney     Disease.  LIPASE, BLOOD  Status: Abnormal   Collection Time    10/14/13 10:10 AM      Result Value Ref Range   Lipase 239 (*) 11 - 59 U/L   Labs are reviewed and are pertinent for alcohol intoxication.  Current Facility-Administered Medications  Medication Dose Route Frequency Provider Last Rate Last Dose  . acetaminophen (TYLENOL) solution 650 mg  650 mg Oral Q6H PRN Juanito Doom, MD   650 mg at 10/14/13 2339  . albuterol (PROVENTIL) (2.5 MG/3ML) 0.083% nebulizer solution 2.5 mg  2.5 mg Inhalation Q2H PRN Chesley Mires, MD      . bismuth subsalicylate (PEPTO BISMOL) 262 MG/15ML suspension 30 mL  30 mL Oral Q4H PRN Juanito Doom, MD      . dextrose 5 % and 0.9 % NaCl with KCl 20 mEq/L infusion   Intravenous Continuous Chesley Mires, MD 75 mL/hr at 10/15/13 0419    . folic acid (FOLVITE) tablet 1 mg  1 mg Oral Daily Darnell Level Mancheril, RPH   1 mg at 10/14/13 1000  . LORazepam (ATIVAN) injection 1 mg  1  mg Intravenous Q6H PRN Juanito Doom, MD   1 mg at 10/15/13 0232  . LORazepam (ATIVAN) injection 2 mg  2 mg Intramuscular Once Gardiner Barefoot, NP      . multivitamin with minerals tablet 1 tablet  1 tablet Oral Daily Raylene Miyamoto, MD   1 tablet at 10/14/13 1156  . pantoprazole (PROTONIX) EC tablet 40 mg  40 mg Oral QHS Darnell Level Mancheril, RPH   40 mg at 10/14/13 2336  . thiamine (VITAMIN B-1) tablet 100 mg  100 mg Oral Daily Darnell Level Mancheril, RPH   100 mg at 10/14/13 1000    Psychiatric Specialty Exam: Physical Exam  ROS  Blood pressure 148/90, pulse 120, temperature 98.8 F (37.1 C), temperature source Oral, resp. rate 20, height $RemoveBe'5\' 9"'YhwNWZVOZ$  (1.753 m), weight 59.2 kg (130 lb 8.2 oz), SpO2 99.00%.Body mass index is 19.26 kg/(m^2).  General Appearance: Disheveled and Guarded  Engineer, water::  Fair  Speech:  Clear and Coherent and Slow  Volume:  Decreased  Mood:  Anxious and Depressed  Affect:  Depressed and Tearful  Thought Process:  Coherent and Goal Directed  Orientation:  Full (Time, Place, and Person)  Thought Content:  Rumination  Suicidal Thoughts:  Yes.  with intent/plan  Homicidal Thoughts:  No  Memory:  Immediate;   Fair Recent;   Fair  Judgement:  Intact  Insight:  Fair  Psychomotor Activity:  Decreased  Concentration:  Fair  Recall:  [employer] of Knowledge:Fair  Language: Fair  Akathisia:  NA  Handed:  Right  AIMS (if indicated):     Assets:  Communication Skills Desire for Improvement Housing Intimacy Leisure Time Resilience Social Support  Sleep:      Musculoskeletal: Strength & Muscle Tone: within normal limits Gait & Station: unable to stand Patient leans: N/A  Treatment Plan Summary: Daily contact with patient to assess and evaluate symptoms and progress in treatment Medication management  Durward Parcel 10/15/2013 9:46 AM

## 2013-10-15 NOTE — Progress Notes (Signed)
Patient up ambulating with NT/sitter. Patient HR  steady in 160's highest HR noted was 173. Patient stated he was a little light headed.  Patient ambulated 50 feet and was escorted back to room in his wheelchair. Vital signs taken and patient back to bed. Patient stated it felt good to be out of bed and walking. Pt oob to chair

## 2013-10-15 NOTE — Progress Notes (Signed)
PATIENT DETAILS Name: [name] Age: 43 y.o. Sex: male Date of Birth: [birth year] Admit Date: 10/05/2013 Admitting Physician Phillips Grout, MD [id number], Gabrielle Dare, MD  Subjective: Wants to go home. Denies  Major complaints today  Assessment/Plan: Principal Problem:   Suicide attempt -intention overdose of flexeril, clonidine.monitored in ICU, transferred to [facility] on 10/15/13 -have asked psych to follow up today.Sitter in place  Active Problems: Acute on chronic Pancreatitis -belly soft-mildly tender to palpation in epigastric area -c/w IVF, advance diet to full liquids  Acute encephalopathy  -2/2 to ETOH withdrawal. > resolved with precedex,  ETOH abuse -no signs of withdrawal. -required Precedex and ICU monitoring -c/w MVI,Thiamine and Folate  Sinus Tachycardia -suspect from pain-?clonidine withdrawl -BP creeping up-will start lopressor (see below)  Protein-calorie malnutrition, severe -c/w supplements  ? Pre-excitation on EKG -tachycardic-spoke with cardiology cardiology-Dr Sonnie Alamo reviewed EKG-ok to start Beta blockers  Disposition: Remain inpatient  DVT Prophylaxis: Lovneox  Code Status: Full code   Family Communication None at bedside  Procedures:  None  CONSULTS:  pulmonary/intensive care  Time spent 40 minutes-which includes 50% of the time with face-to-face with patient/ family and coordinating care related to the above assessment and plan.    MEDICATIONS: Scheduled Meds: . folic acid  1 mg Oral Daily  . LORazepam  2 mg Intramuscular Once  . multivitamin with minerals  1 tablet Oral Daily  . pantoprazole  40 mg Oral QHS  . thiamine  100 mg Oral Daily   Continuous Infusions: . dextrose 5 % and 0.9 % NaCl with KCl 20 mEq/L 75 mL/hr at 10/15/13 0419   PRN Meds:.acetaminophen (TYLENOL) oral liquid 160 mg/5 mL, albuterol, bismuth subsalicylate, LORazepam  Antibiotics: Anti-infectives   None       PHYSICAL  EXAM: Vital signs in last 24 hours: Filed Vitals:   10/14/13 1728 10/14/13 2044 10/15/13 0232 10/15/13 1004  BP: 145/92 155/109 148/90 128/88  Pulse: 121 120  134  Temp: 98.9 F (37.2 C) 98.8 F (37.1 C)  98.2 F (36.8 C)  TempSrc: Oral Oral    Resp: 20 20    Height:      Weight:      SpO2: 97% 99%  98%    Weight change:  Filed Weights   10/05/13 2302 10/06/13 0002 10/06/13 0345  Weight: 66.679 kg (147 lb) 79.379 kg (175 lb) 59.2 kg (130 lb 8.2 oz)   Body mass index is 19.26 kg/(m^2).   Gen Exam: Awake and alert with clear speech.   Neck: Supple, No JVD.   Chest: B/L Clear.   CVS: S1 S2 Regular, no murmurs.  Abdomen: soft, BS +, non tender, non distended.  Extremities: no edema, lower extremities warm to touch. Neurologic: Non Focal.   Skin: No Rash.   Wounds: N/A.   Intake/Output from previous day:  Intake/Output Summary (Last 24 hours) at 10/15/13 1238 Last data filed at 10/15/13 0416  Gross per 24 hour  Intake    300 ml  Output   2600 ml  Net  -2300 ml     LAB RESULTS: CBC  Recent Labs Lab 10/10/13 0320 10/10/13 1400 10/11/13 0430 10/12/13 0324 10/13/13 0259 10/14/13 1010  WBC 5.6  --  7.7 8.6 9.3 12.9*  HGB 6.5* 8.5* 8.5* 10.9* 10.4* 8.5*  HCT 20.4* 26.0* 26.5* 33.8* 31.4* 26.0*  PLT 67*  --  71* 73* 105* 123*  MCV 109.1*  --  101.1* 100.9* 97.8 97.4  MCH 34.8*  --  32.4 32.5 32.4 31.8  MCHC 31.9  --  32.1 32.2 33.1 32.7  RDW 17.1*  --  24.5* 23.0* 21.0* 20.1*    Chemistries   Recent Labs Lab 10/10/13 0320 10/11/13 0430 10/12/13 0324 10/13/13 0259 10/14/13 0220 10/14/13 1010  NA 137 138 140 140  --  138  K 3.5* 4.1 4.3 4.1  --  3.9  CL 107 107 107 106  --  104  CO2 21 20 21 22   --  21  GLUCOSE 109* 105* 90 88  --  94  BUN <3* <3* <3* <3*  --  3*  CREATININE 0.61 0.46* 0.52 0.53  --  0.63  CALCIUM 7.2* 7.8* 8.4 8.6  --  8.2*  MG 1.6 1.7 1.6 1.7 1.4*  --     CBG:  Recent Labs Lab 10/08/13 1908 10/09/13 0034 10/09/13 0354  10/09/13 1143  GLUCAP 115* 89 78 343*    GFR Estimated Creatinine Clearance: 98.7 ml/min (by C-G formula based on Cr of 0.63).  Coagulation profile No results found for this basename: INR, PROTIME,  in the last 168 hours  Cardiac Enzymes  Recent Labs Lab 10/08/13 2035  TROPONINI <0.30    No components found with this basename: POCBNP,  No results found for this basename: DDIMER,  in the last 72 hours No results found for this basename: HGBA1C,  in the last 72 hours No results found for this basename: CHOL, HDL, LDLCALC, TRIG, CHOLHDL, LDLDIRECT,  in the last 72 hours No results found for this basename: TSH, T4TOTAL, FREET3, T3FREE, THYROIDAB,  in the last 72 hours No results found for this basename: VITAMINB12, FOLATE, FERRITIN, TIBC, IRON, RETICCTPCT,  in the last 72 hours  Recent Labs  10/14/13 1010  LIPASE 239*    Urine Studies No results found for this basename: UACOL, UAPR, USPG, UPH, UTP, UGL, UKET, UBIL, UHGB, UNIT, UROB, ULEU, UEPI, UWBC, URBC, UBAC, CAST, CRYS, UCOM, BILUA,  in the last 72 hours  MICROBIOLOGY: Recent Results (from the past 240 hour(s))  MRSA PCR SCREENING     Status: None   Collection Time    10/07/13  8:26 AM      Result Value Ref Range Status   MRSA by PCR NEGATIVE  NEGATIVE Final   Comment:            The GeneXpert MRSA Assay (FDA     approved for NASAL specimens     only), is one component of a     comprehensive MRSA colonization     surveillance program. It is not     intended to diagnose MRSA     infection nor to guide or     monitor treatment for     MRSA infections.  MRSA PCR SCREENING     Status: None   Collection Time    10/09/13  1:00 AM      Result Value Ref Range Status   MRSA by PCR NEGATIVE  NEGATIVE Final   Comment:            The GeneXpert MRSA Assay (FDA     approved for NASAL specimens     only), is one component of a     comprehensive MRSA colonization     surveillance program. It is not     intended to  diagnose MRSA     infection nor to guide or     monitor treatment for     MRSA  infections.    RADIOLOGY STUDIES/RESULTS: Dg Chest Port 1 View  10/12/2013   CLINICAL DATA:  Pneumonia.  EXAM: PORTABLE CHEST - 1 VIEW  COMPARISON:  10/08/2013  FINDINGS: The cardiac silhouette, mediastinal and hilar contours are stable. Persistent but improved bilateral interstitial and airspace process likely atypical pneumonia. No pleural effusion or pneumothorax.  IMPRESSION: Persistent but improved bilateral interstitial and airspace process.   Electronically Signed   By: Kalman Jewels M.D.   On: 10/12/2013 07:02   Dg Chest Port 1 View  10/08/2013   CLINICAL DATA:  Assess infiltrates.  EXAM: PORTABLE CHEST - 1 VIEW  COMPARISON:  10/06/2013.  FINDINGS: Bilateral irregular interstitial thickening appears increased from the prior study, although this may be in part due to lower lung volumes and a more semi-erect positioning.  No pleural effusion. No pneumothorax. Cardiac silhouette is normal in size. Normal mediastinal and hilar contours.  IMPRESSION: Increased interstitial thickening. Diffuse bilateral interstitial infiltrates versus interstitial edema. Degree of interstitial thickening has increased when compared the prior study.   Electronically Signed   By: Lajean Manes M.D.   On: 10/08/2013 20:35   Dg Chest Portable 1 View  10/06/2013   CLINICAL DATA:  Drug overdose  EXAM: PORTABLE CHEST - 1 VIEW  COMPARISON:  08/16/2013  FINDINGS: There is interstitial coarsening and mild hypoaeration. Normal heart size and mediastinal contours. No effusion or pneumothorax.  IMPRESSION: Diffuse interstitial prominence, likely from hypoaeration. Cannot exclude mild aspiration pneumonitis.   Electronically Signed   By: Jorje Guild M.D.   On: 10/06/2013 00:56   Dg Abd Portable 1v  10/14/2013   CLINICAL DATA:  Mid to left abdominal pain.  EXAM: PORTABLE ABDOMEN - 1 VIEW  COMPARISON:  10/06/2013  FINDINGS: Bowel gas pattern is  nonobstructive. No organomegaly or abnormal calcifications identified. Visualized osseous structures have a normal appearance. Patient is rotated.  IMPRESSION: No evidence for acute  abnormality.   Electronically Signed   By: Shon Hale M.D.   On: 10/14/2013 10:01   Dg Abd Portable 1v  10/06/2013   CLINICAL DATA:  43 year old male drug overdose. Initial encounter.  EXAM: PORTABLE ABDOMEN - 1 VIEW  COMPARISON:  CT Abdomen and Pelvis 4 levin 2014.  FINDINGS: Portable AP supine view at 0046 hrs. Non obstructed bowel gas pattern. Stable right hemipelvis phlebolith. Evidence of a healed posterior right tenth rib fracture. No acute osseous abnormality identified. No definite pneumoperitoneum on this supine view.  IMPRESSION: Non obstructed bowel gas pattern.   Electronically Signed   By: Lars Pinks M.D.   On: 10/06/2013 00:56    Lila Lufkin Kristeen Mans, MD  [facility] Pager:[phone]  If 7PM-7AM, please contact night-coverage [email or web address] Password [email or web address] 10/15/2013, 12:38 PM   LOS: 10 days   **Disclaimer: This note may have been dictated with voice recognition software. Similar sounding words can inadvertently be transcribed and this note may contain transcription errors which may not have been corrected upon publication of note.**

## 2013-10-15 NOTE — Progress Notes (Signed)
Pt being combative and trying to leave the hospital. Security was called. Pt was given 1mg  of ativan and security was called. Pt pulled off his tele box and is refusing to let staff put it back on him. CCMD was notified. Provider on call has been notified. Pt has now pulled out his IV access & is still refusing to let staff put his tele box back on. I will notify provider on call again.

## 2013-10-15 NOTE — Progress Notes (Signed)
CSW completed IVC paperwork and faxed to magistrate. Confirmed receipt of paperwork with magistrate.

## 2013-10-16 DIAGNOSIS — R000 Tachycardia, unspecified: Secondary | ICD-10-CM

## 2013-10-16 DIAGNOSIS — T50992A Poisoning by other drugs, medicaments and biological substances, intentional self-harm, initial encounter: Secondary | ICD-10-CM

## 2013-10-16 DIAGNOSIS — T465X1A Poisoning by other antihypertensive drugs, accidental (unintentional), initial encounter: Secondary | ICD-10-CM

## 2013-10-16 DIAGNOSIS — T481X4A Poisoning by skeletal muscle relaxants [neuromuscular blocking agents], undetermined, initial encounter: Principal | ICD-10-CM

## 2013-10-16 LAB — PHOSPHORUS: Phosphorus: 4.7 mg/dL — ABNORMAL HIGH (ref 2.3–4.6)

## 2013-10-16 LAB — HEPATIC FUNCTION PANEL
ALT: 42 U/L (ref 0–53)
AST: 57 U/L — AB (ref 0–37)
Albumin: 1.9 g/dL — ABNORMAL LOW (ref 3.5–5.2)
Alkaline Phosphatase: 192 U/L — ABNORMAL HIGH (ref 39–117)
BILIRUBIN TOTAL: 1.3 mg/dL — AB (ref 0.3–1.2)
Bilirubin, Direct: 0.6 mg/dL — ABNORMAL HIGH (ref 0.0–0.3)
Indirect Bilirubin: 0.7 mg/dL (ref 0.3–0.9)
Total Protein: 6.6 g/dL (ref 6.0–8.3)

## 2013-10-16 LAB — BASIC METABOLIC PANEL
BUN: 3 mg/dL — AB (ref 6–23)
CO2: 22 mEq/L (ref 19–32)
Calcium: 8.8 mg/dL (ref 8.4–10.5)
Chloride: 106 mEq/L (ref 96–112)
Creatinine, Ser: 0.61 mg/dL (ref 0.50–1.35)
GFR calc Af Amer: >90 mL/min (ref >90)
GFR calc non Af Amer: >90 mL/min (ref >90)
Glucose, Bld: 86 mg/dL (ref 70–99)
Potassium: 4.4 mEq/L (ref 3.7–5.3)
Sodium: 141 mEq/L (ref 137–147)

## 2013-10-16 LAB — MAGNESIUM: Magnesium: 1.8 mg/dL (ref 1.5–2.5)

## 2013-10-16 LAB — LIPASE, BLOOD: LIPASE: 248 U/L — AB (ref 11–59)

## 2013-10-16 MED ORDER — SODIUM CHLORIDE 0.9 % IV SOLN
250.0000 mL | INTRAVENOUS | Status: AC
  Administered 2013-10-16: 250 mL via INTRAVENOUS

## 2013-10-16 MED ORDER — ALUM & MAG HYDROXIDE-SIMETH 200-200-20 MG/5ML PO SUSP
30.0000 mL | ORAL | Status: DC | PRN
  Administered 2013-10-16 – 2013-10-21 (×8): 30 mL via ORAL
  Filled 2013-10-16 (×9): qty 30

## 2013-10-16 MED ORDER — SODIUM CHLORIDE 0.9 % IJ SOLN
3.0000 mL | Freq: Two times a day (BID) | INTRAMUSCULAR | Status: DC
  Administered 2013-10-16 – 2013-10-22 (×8): 3 mL via INTRAVENOUS

## 2013-10-16 MED ORDER — HYDRALAZINE HCL 20 MG/ML IJ SOLN
10.0000 mg | Freq: Once | INTRAMUSCULAR | Status: AC
  Administered 2013-10-16: 10 mg via INTRAVENOUS
  Filled 2013-10-16: qty 0.5

## 2013-10-16 MED ORDER — METOPROLOL TARTRATE 50 MG PO TABS
50.0000 mg | ORAL_TABLET | Freq: Two times a day (BID) | ORAL | Status: DC
  Administered 2013-10-16 – 2013-10-22 (×13): 50 mg via ORAL
  Filled 2013-10-16 (×14): qty 1

## 2013-10-16 MED ORDER — SODIUM CHLORIDE 0.9 % IV BOLUS (SEPSIS)
250.0000 mL | Freq: Once | INTRAVENOUS | Status: AC
  Administered 2013-10-16: 250 mL via INTRAVENOUS

## 2013-10-16 MED ORDER — QUETIAPINE FUMARATE 100 MG PO TABS
100.0000 mg | ORAL_TABLET | Freq: Every day | ORAL | Status: DC
  Administered 2013-10-16 – 2013-10-21 (×6): 100 mg via ORAL
  Filled 2013-10-16 (×7): qty 1

## 2013-10-16 MED ORDER — QUETIAPINE FUMARATE 25 MG PO TABS
25.0000 mg | ORAL_TABLET | Freq: Two times a day (BID) | ORAL | Status: DC
  Filled 2013-10-16 (×2): qty 1

## 2013-10-16 MED ORDER — HALOPERIDOL LACTATE 5 MG/ML IJ SOLN
5.0000 mg | Freq: Once | INTRAMUSCULAR | Status: AC
  Administered 2013-10-16: 5 mg via INTRAMUSCULAR
  Filled 2013-10-16: qty 1

## 2013-10-16 MED ORDER — SODIUM CHLORIDE 0.9 % IV SOLN: 250.0000 mL | INTRAVENOUS | Status: DC | PRN

## 2013-10-16 MED ORDER — QUETIAPINE FUMARATE 25 MG PO TABS
25.0000 mg | ORAL_TABLET | Freq: Two times a day (BID) | ORAL | Status: DC
  Administered 2013-10-17 – 2013-10-22 (×12): 25 mg via ORAL
  Filled 2013-10-16 (×13): qty 1

## 2013-10-16 MED ORDER — SODIUM CHLORIDE 0.9 % IJ SOLN: 3.0000 mL | INTRAMUSCULAR | Status: DC | PRN

## 2013-10-16 MED ORDER — METOPROLOL TARTRATE 1 MG/ML IV SOLN: 2.5000 mg | INTRAVENOUS | Status: DC | PRN

## 2013-10-16 MED ORDER — CYCLOBENZAPRINE HCL 5 MG PO TABS
5.0000 mg | ORAL_TABLET | Freq: Every day | ORAL | Status: DC
  Administered 2013-10-16 – 2013-10-22 (×7): 5 mg via ORAL
  Filled 2013-10-16 (×7): qty 1

## 2013-10-16 MED ORDER — SODIUM CHLORIDE 0.9 % IV SOLN: 250.0000 mL | INTRAVENOUS | Status: DC

## 2013-10-16 NOTE — Progress Notes (Signed)
  I have directly reviewed the clinical findings, lab, imaging studies and management of this patient in detail. I have interviewed and examined the patient and agree with the documentation,  as recorded by the Physician extender.  [name] M.D on 10/16/2013 at 11:39 AM  [facility] Office  [phone]

## 2013-10-16 NOTE — Progress Notes (Signed)
PATIENT DETAILS Name: [name] Age: 43 y.o. Sex: male Date of Birth: [birth year] Admit Date: 10/05/2013 Admitting Physician Phillips Grout, MD [id number], Gabrielle Dare, MD  Subjective: Wants condom cath removed.  Of note, patient was very disruptive during the night. Security was called 2x.  Patient threw a bedside commode at RN.  Assessment/Plan: Principal Problem:  Suicide attempt -intentional overdose of flexeril, clonidine.monitored in ICU, transferred to [facility] on 10/15/13 -have asked psych to follow up daily. Sitter in place -Involuntary commitment.  Active Problems:  SVT -Uncertain etiology.  Paradoxical reactions to medications?  Had overdosed on clonidine on admit.  SVT Manifests usually when he stands or walks. -Has required IV lopressor.  Started on metoprolol bid 25 mg on 6/9.  Increased to 50 mg bid 6/10. -? Pre-excitation on EKG.  Last echo was 5/14 - normal. -6/9 became tachycardic-spoke with Dr Sonnie Alamo reviewed EKG-ok to start Beta blockers -Cardiology consulted 6/10 as pulse rate is worsening despite BB.  Acute on chronic Pancreatitis -belly soft-mildly tender to palpation in epigastric area -advance diet to low fat.  Patient reports some burning pain at night, but overall improved.  No need for further work up.  Acute encephalopathy  -2/2 to ETOH withdrawal. > resolved with precedex,  ETOH abuse -no signs of withdrawal currently. -required Precedex and ICU monitoring -c/w MVI,Thiamine and Folate  Protein-calorie malnutrition, severe -c/w supplements  Disposition: Remain inpatient.  Transfer to [facility] when medically stable.  DVT Prophylaxis: Lovneox  Code Status: Full code   Family Communication Mother at bedside.  Procedures:  None  CONSULTS: Critical Care. Cardiology Psychiatry.  Time spent 40 minutes-which includes 50% of the time with face-to-face with patient/ family and coordinating care related to the above assessment  and plan.    MEDICATIONS: Scheduled Meds: . enoxaparin (LOVENOX) injection  40 mg Subcutaneous Q24H  . folic acid  1 mg Oral Daily  . LORazepam  2 mg Intramuscular Once  . metoprolol tartrate  50 mg Oral BID  . multivitamin with minerals  1 tablet Oral Daily  . pantoprazole  40 mg Oral QHS  . sodium chloride  3 mL Intravenous Q12H  . thiamine  100 mg Oral Daily   Continuous Infusions:   PRN Meds:.sodium chloride, acetaminophen (TYLENOL) oral liquid 160 mg/5 mL, albuterol, bismuth subsalicylate, LORazepam, metoprolol, sodium chloride  Antibiotics: Anti-infectives   None       PHYSICAL EXAM: Vital signs in last 24 hours: Filed Vitals:   10/15/13 2000 10/15/13 2059 10/15/13 2113 10/16/13 0642  BP:   143/93 112/66  Pulse:   104 107  Temp:   98.4 F (36.9 C) 98.3 F (36.8 C)  TempSrc:   Oral Oral  Resp: 18 19 20 22   Height:      Weight:      SpO2:   100% 100%    Weight change:  Filed Weights   10/05/13 2302 10/06/13 0002 10/06/13 0345  Weight: 66.679 kg (147 lb) 79.379 kg (175 lb) 59.2 kg (130 lb 8.2 oz)   Body mass index is 19.26 kg/(m^2).   Gen Exam: Thin, wd, AA male,  Awake and alert with clear speech.  Sitter at bedside. Neck: Supple, No JVD.   Chest: B/L Clear.  Now w/c/r CVS: S1 S2 Regular, no murmurs.  Abdomen:  thin, firm, BS +, mildly tender to palpation., non distended.  Extremities: no edema, lower extremities warm to touch. Neurologic: Non Focal.   Skin: No Rash.  Intake/Output from previous day:  Intake/Output Summary (Last 24 hours) at 10/16/13 1128 Last data filed at 10/16/13 0645  Gross per 24 hour  Intake    360 ml  Output   4025 ml  Net  -3665 ml     LAB RESULTS: CBC  Recent Labs Lab 10/10/13 0320 10/10/13 1400 10/11/13 0430 10/12/13 0324 10/13/13 0259 10/14/13 1010  WBC 5.6  --  7.7 8.6 9.3 12.9*  HGB 6.5* 8.5* 8.5* 10.9* 10.4* 8.5*  HCT 20.4* 26.0* 26.5* 33.8* 31.4* 26.0*  PLT 67*  --  71* 73* 105* 123*  MCV  109.1*  --  101.1* 100.9* 97.8 97.4  MCH 34.8*  --  32.4 32.5 32.4 31.8  MCHC 31.9  --  32.1 32.2 33.1 32.7  RDW 17.1*  --  24.5* 23.0* 21.0* 20.1*    Chemistries   Recent Labs Lab 10/11/13 0430 10/12/13 0324 10/13/13 0259 10/14/13 0220 10/14/13 1010 10/16/13 0652  NA 138 140 140  --  138 141  K 4.1 4.3 4.1  --  3.9 4.4  CL 107 107 106  --  104 106  CO2 20 21 22   --  21 22  GLUCOSE 105* 90 88  --  94 86  BUN <3* <3* <3*  --  3* 3*  CREATININE 0.46* 0.52 0.53  --  0.63 0.61  CALCIUM 7.8* 8.4 8.6  --  8.2* 8.8  MG 1.7 1.6 1.7 1.4*  --  1.8    CBG:  Recent Labs Lab 10/09/13 1143  GLUCAP 343*     Recent Labs  10/14/13 1010 10/16/13 0652  LIPASE 239* 248*     MICROBIOLOGY: Recent Results (from the past 240 hour(s))  MRSA PCR SCREENING     Status: None   Collection Time    10/07/13  8:26 AM      Result Value Ref Range Status   MRSA by PCR NEGATIVE  NEGATIVE Final   Comment:            The GeneXpert MRSA Assay (FDA     approved for NASAL specimens     only), is one component of a     comprehensive MRSA colonization     surveillance program. It is not     intended to diagnose MRSA     infection nor to guide or     monitor treatment for     MRSA infections.  MRSA PCR SCREENING     Status: None   Collection Time    10/09/13  1:00 AM      Result Value Ref Range Status   MRSA by PCR NEGATIVE  NEGATIVE Final   Comment:            The GeneXpert MRSA Assay (FDA     approved for NASAL specimens     only), is one component of a     comprehensive MRSA colonization     surveillance program. It is not     intended to diagnose MRSA     infection nor to guide or     monitor treatment for     MRSA infections.    RADIOLOGY STUDIES/RESULTS: Dg Chest Port 1 View  10/12/2013   CLINICAL DATA:  Pneumonia.  EXAM: PORTABLE CHEST - 1 VIEW  COMPARISON:  10/08/2013  FINDINGS: The cardiac silhouette, mediastinal and hilar contours are stable. Persistent but improved  bilateral interstitial and airspace process likely atypical pneumonia. No pleural effusion or pneumothorax.  IMPRESSION: Persistent but  improved bilateral interstitial and airspace process.   Electronically Signed   By: Kalman Jewels M.D.   On: 10/12/2013 07:02   Dg Chest Port 1 View  10/08/2013   CLINICAL DATA:  Assess infiltrates.  EXAM: PORTABLE CHEST - 1 VIEW  COMPARISON:  10/06/2013.  FINDINGS: Bilateral irregular interstitial thickening appears increased from the prior study, although this may be in part due to lower lung volumes and a more semi-erect positioning.  No pleural effusion. No pneumothorax. Cardiac silhouette is normal in size. Normal mediastinal and hilar contours.  IMPRESSION: Increased interstitial thickening. Diffuse bilateral interstitial infiltrates versus interstitial edema. Degree of interstitial thickening has increased when compared the prior study.   Electronically Signed   By: Lajean Manes M.D.   On: 10/08/2013 20:35   Dg Chest Portable 1 View  10/06/2013   CLINICAL DATA:  Drug overdose  EXAM: PORTABLE CHEST - 1 VIEW  COMPARISON:  08/16/2013  FINDINGS: There is interstitial coarsening and mild hypoaeration. Normal heart size and mediastinal contours. No effusion or pneumothorax.  IMPRESSION: Diffuse interstitial prominence, likely from hypoaeration. Cannot exclude mild aspiration pneumonitis.   Electronically Signed   By: Jorje Guild M.D.   On: 10/06/2013 00:56   Dg Abd Portable 1v  10/14/2013   CLINICAL DATA:  Mid to left abdominal pain.  EXAM: PORTABLE ABDOMEN - 1 VIEW  COMPARISON:  10/06/2013  FINDINGS: Bowel gas pattern is nonobstructive. No organomegaly or abnormal calcifications identified. Visualized osseous structures have a normal appearance. Patient is rotated.  IMPRESSION: No evidence for acute  abnormality.   Electronically Signed   By: Shon Hale M.D.   On: 10/14/2013 10:01   Dg Abd Portable 1v  10/06/2013   CLINICAL DATA:  43 year old male drug overdose.  Initial encounter.  EXAM: PORTABLE ABDOMEN - 1 VIEW  COMPARISON:  CT Abdomen and Pelvis 4 levin 2014.  FINDINGS: Portable AP supine view at 0046 hrs. Non obstructed bowel gas pattern. Stable right hemipelvis phlebolith. Evidence of a healed posterior right tenth rib fracture. No acute osseous abnormality identified. No definite pneumoperitoneum on this supine view.  IMPRESSION: Non obstructed bowel gas pattern.   Electronically Signed   By: Lars Pinks M.D.   On: 10/06/2013 00:56    Karen Kitchens [facility] Pager:[phone]  If 7PM-7AM, please contact night-coverage [email or web address] Password [email or web address] 10/16/2013, 11:28 AM   LOS: 11 days   **Disclaimer: This note may have been dictated with voice recognition software. Similar sounding words can inadvertently be transcribed and this note may contain transcription errors which may not have been corrected upon publication of note.**

## 2013-10-16 NOTE — Progress Notes (Signed)
Late Entry- Patient has been combative and agitated throughout night thinking that he is at home. Patient has threatened and tried to hit staff. Security was called and patient was given IM Haldol 5 mg IV. Patient still threatened to leave room. Patient has shouted obscenities and threats to myself, sitter, and other staff.Patient has yelled for other family members that were not there.  Called security on patient twice throughout night. Patient grabbed sitter, threw bedside commode, and pulled IV. Patient removed condom catheter and it was replaced throughout night.

## 2013-10-16 NOTE — Consult Note (Signed)
[facility] Face-to-Face Psychiatry Consult Follow up   Reason for Consult:  Alcohol intoxication, depression and s/p suicide attempt Referring Physician:  Jonetta Osgood, MD  [name] is an 43 y.o. male. Total Time spent with patient: 45 minutes  Assessment: AXIS I:  Major Depression, Recurrent severe, Substance Induced Mood Disorder and Alcohol dependence AXIS II:  Deferred AXIS III:   Past Medical History  Diagnosis Date  . Hypertension   . Asthma   . Pancreatitis   . Cocaine abuse   . Depression   . H/O suicide attempt   . Heart murmur     "when he was little" (03/06/2013)  . Shortness of breath     "can happen at anytime" (03/06/2013)  . Anemia   . H/O hiatal hernia   . GERD (gastroesophageal reflux disease)   . Seizures     "weekly" (03/06/2013)  . Arthritis     "knees" (03/06/2013)  . Anxiety   . WPW (Wolff-Parkinson-White syndrome)     [name] 03/06/2013   AXIS IV:  economic problems, occupational problems, other psychosocial or environmental problems, problems related to social environment and problems with primary support group AXIS V:  41-50 serious symptoms  Plan:  Patient continued to receive IV fluids,  Will start Seroquel 25 mg BID and 100 mg Qhs for agitation and aggression Recommend psychiatric Inpatient admission when medically cleared. Supportive therapy provided about ongoing stressors. Continuous safety sitter and CIWA protocol and continue current medication management Appreciate psychiatric consultation Please contact [phone] if needs further assistance  Subjective:   [name] is a 43 y.o. male patient admitted with suicide attempt and alcohol intoxication.  HPI:  Patient was seen and chart reviewed. Psychiatric consultation was requested a suicide attempt while intoxicated. This is a 43 yo male with history of alcohol abuse versus dependence, elevated liver enzymes, depression/anxiety comes in after intentionally ingesting some of  his home meds to kill himself per intial report he told the emergency department on arrival. Patient was disheveled, poorly groomed and denies current symptoms of depression, anxiety and psychosis. Patient was placed in the 4 point restraints as he is being combative and removing catheters and intravenous tubes. He tells me he did this to try to get high off of the flexeril. He says he took 15 pills, combination of some of his flexeril and some of his clonidine. His girlfriend is pregnant and is planning on getting an abortion tomorrow, and they were argueing about that, and he got very upset and took a bunch of his pills. This occurred at about 10pm. He received charcoal in the emergency department. He also reports he has been drinking beer.   Interval history: The patient was seen today for psychiatric consultation follow up. Patient appeared with clean shave and well groomed. Patient is calm and cooperative and reportedly has been in contact with his brother. Patient reported he has been feeling much better today and wants to go home with the plan of residential substance abuse treatment program at [facility]. Patient stated he understands about his situation with his girlfriend and does not want to continue to suffer with it. Patient  stated that he wants to go home and start looking for a job to make money for himself and his granddaughters. Patient had post suicide attempt but denies current suicide intent and plans.   Patient reportedly has conflict with his baby's mother who was pregnant at 4 months gestation. Patient mother is living in  [place] and came to here and spend a few months the lost 6 months to one year. Reportedly patient met her on Facebook. Patient stopped drinking 3 weeks ago. Patient stated that his baby his mother threatened about going for an abortion because she does not believe patient can support the pregnancy and the baby. Patient reported he feels depressed about not  having the job and finances to support them. 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 Patient requesting treatment for depression, anxiety and also interested to participate alcohol rehabilitation treatment at [facility] if possible. Patient family especially mother and father and a sister visiting him while in the hospital. Patient has completed alcohol detox treatment with precedex. His blood alcohol level in the emergency department his 298 on arrival. Patient had alcohol detox, rehabilitation treatment 2012 at [facility] which was now [facility].   HPI Elements:   Location:  Depression and alcohol abuse. Quality:  poor. Severity:  Acute. Timing:  Suicide attempt and intoxication.  Past Psychiatric History: Past Medical History  Diagnosis Date  . Hypertension   . Asthma   . Pancreatitis   . Cocaine abuse   . Depression   . H/O suicide attempt   . Heart murmur     "when he was little" (03/06/2013)  . Shortness of breath     "can happen at anytime" (03/06/2013)  . Anemia   . H/O hiatal hernia   . GERD (gastroesophageal reflux disease)   . Seizures     "weekly" (03/06/2013)  . Arthritis     "knees" (03/06/2013)  . Anxiety   . WPW (Wolff-Parkinson-White syndrome)     [name] 03/06/2013    reports that he has been smoking Cigarettes.  He has a 30 pack-year smoking history. He uses smokeless tobacco. He reports that he drinks about 25.2 ounces of alcohol per week. He reports that he uses illicit drugs (Cocaine and Marijuana). Family History  Problem Relation Age of Onset  . Hypertension Other   . Coronary artery disease Other      Living Arrangements: Parent   Abuse/Neglect [facility]) Physical Abuse: Denies Verbal Abuse: Denies Sexual Abuse: Denies Allergies:   Allergies  Allergen Reactions  . Trazodone And Nefazodone Other (See Comments)    Muscle spasms  . Shellfish-Derived Products Nausea And Vomiting    ACT  Assessment Complete:  No Objective: Blood pressure 122/83, pulse 84, temperature 98.2 F (36.8 C), temperature source Oral, resp. rate 18, height 5' 9" (1.753 m), weight 59.2 kg (130 lb 8.2 oz), SpO2 100.00%.Body mass index is 19.26 kg/(m^2). Results for orders placed during the hospital encounter of 10/05/13 (from the past 72 hour(s))  MAGNESIUM     Status: Abnormal   Collection Time    10/14/13  2:20 AM      Result Value Ref Range   Magnesium 1.4 (*) 1.5 - 2.5 mg/dL  PHOSPHORUS     Status: None   Collection Time    10/14/13  2:20 AM      Result Value Ref Range   Phosphorus 3.6  2.3 - 4.6 mg/dL  CBC     Status: Abnormal   Collection Time    10/14/13 10:10 AM      Result Value Ref Range   WBC 12.9 (*) 4.0 - 10.5 K/uL   Comment: REPEATED TO VERIFY   RBC 2.67 (*) 4.22 - 5.81 MIL/uL   Hemoglobin 8.5 (*) 13.0 - 17.0 g/dL  Comment: REPEATED TO VERIFY   HCT 26.0 (*) 39.0 - 52.0 %   MCV 97.4  78.0 - 100.0 fL   Comment: CONSISTENT WITH PREVIOUS RESULT   MCH 31.8  26.0 - 34.0 pg   MCHC 32.7  30.0 - 36.0 g/dL   RDW 20.1 (*) 11.5 - 15.5 %   Platelets 123 (*) 150 - 400 K/uL   Comment: REPEATED TO VERIFY  COMPREHENSIVE METABOLIC PANEL     Status: Abnormal   Collection Time    10/14/13 10:10 AM      Result Value Ref Range   Sodium 138  137 - 147 mEq/L   Potassium 3.9  3.7 - 5.3 mEq/L   Chloride 104  96 - 112 mEq/L   CO2 21  19 - 32 mEq/L   Glucose, Bld 94  70 - 99 mg/dL   BUN 3 (*) 6 - 23 mg/dL   Creatinine, Ser 0.63  0.50 - 1.35 mg/dL   Calcium 8.2 (*) 8.4 - 10.5 mg/dL   Total Protein 6.5  6.0 - 8.3 g/dL   Albumin 1.7 (*) 3.5 - 5.2 g/dL   AST 50 (*) 0 - 37 U/L   ALT 41  0 - 53 U/L   Alkaline Phosphatase 215 (*) 39 - 117 U/L   Total Bilirubin 1.4 (*) 0.3 - 1.2 mg/dL   GFR calc non Af Amer >90  >90 mL/min   GFR calc Af Amer >90  >90 mL/min   Comment: (NOTE)     The eGFR has been calculated using the CKD EPI equation.     This calculation has not been validated in all clinical  situations.     eGFR's persistently <90 mL/min signify possible Chronic Kidney     Disease.  LIPASE, BLOOD     Status: Abnormal   Collection Time    10/14/13 10:10 AM      Result Value Ref Range   Lipase 239 (*) 11 - 59 U/L  MAGNESIUM     Status: None   Collection Time    10/16/13  6:52 AM      Result Value Ref Range   Magnesium 1.8  1.5 - 2.5 mg/dL  PHOSPHORUS     Status: Abnormal   Collection Time    10/16/13  6:52 AM      Result Value Ref Range   Phosphorus 4.7 (*) 2.3 - 4.6 mg/dL  BASIC METABOLIC PANEL     Status: Abnormal   Collection Time    10/16/13  6:52 AM      Result Value Ref Range   Sodium 141  137 - 147 mEq/L   Potassium 4.4  3.7 - 5.3 mEq/L   Chloride 106  96 - 112 mEq/L   CO2 22  19 - 32 mEq/L   Glucose, Bld 86  70 - 99 mg/dL   BUN 3 (*) 6 - 23 mg/dL   Creatinine, Ser 0.61  0.50 - 1.35 mg/dL   Calcium 8.8  8.4 - 10.5 mg/dL   GFR calc non Af Amer >90  >90 mL/min   GFR calc Af Amer >90  >90 mL/min   Comment: (NOTE)     The eGFR has been calculated using the CKD EPI equation.     This calculation has not been validated in all clinical situations.     eGFR's persistently <90 mL/min signify possible Chronic Kidney     Disease.  LIPASE, BLOOD     Status: Abnormal   Collection Time  10/16/13  6:52 AM      Result Value Ref Range   Lipase 248 (*) 11 - 59 U/L  HEPATIC FUNCTION PANEL     Status: Abnormal   Collection Time    10/16/13  6:52 AM      Result Value Ref Range   Total Protein 6.6  6.0 - 8.3 g/dL   Albumin 1.9 (*) 3.5 - 5.2 g/dL   AST 57 (*) 0 - 37 U/L   ALT 42  0 - 53 U/L   Alkaline Phosphatase 192 (*) 39 - 117 U/L   Total Bilirubin 1.3 (*) 0.3 - 1.2 mg/dL   Bilirubin, Direct 0.6 (*) 0.0 - 0.3 mg/dL   Indirect Bilirubin 0.7  0.3 - 0.9 mg/dL   Labs are reviewed and are pertinent for alcohol intoxication.  Current Facility-Administered Medications  Medication Dose Route Frequency Provider Last Rate Last Dose  . 0.9 %  sodium chloride infusion   250 mL Intravenous Continuous [name], MD      . acetaminophen (TYLENOL) solution 650 mg  650 mg Oral Q6H PRN Juanito Doom, MD   650 mg at 10/16/13 1154  . albuterol (PROVENTIL) (2.5 MG/3ML) 0.083% nebulizer solution 2.5 mg  2.5 mg Inhalation Q2H PRN Chesley Mires, MD      . bismuth subsalicylate (PEPTO BISMOL) 262 MG/15ML suspension 30 mL  30 mL Oral Q4H PRN Juanito Doom, MD      . cyclobenzaprine (FLEXERIL) tablet 5 mg  5 mg Oral Daily [name], MD      . enoxaparin (LOVENOX) injection 40 mg  40 mg Subcutaneous Q24H Jonetta Osgood, MD   40 mg at 10/15/13 1500  . folic acid (FOLVITE) tablet 1 mg  1 mg Oral Daily Darnell Level Mancheril, RPH   1 mg at 10/16/13 1013  . LORazepam (ATIVAN) injection 1 mg  1 mg Intravenous Q6H PRN Juanito Doom, MD   1 mg at 10/15/13 2100  . LORazepam (ATIVAN) injection 2 mg  2 mg Intramuscular Once Gardiner Barefoot, NP      . metoprolol (LOPRESSOR) injection 2.5 mg  2.5 mg Intravenous Q5 min PRN Melton Alar, PA-C      . metoprolol (LOPRESSOR) tablet 50 mg  50 mg Oral BID Melton Alar, PA-C   50 mg at 10/16/13 1124  . multivitamin with minerals tablet 1 tablet  1 tablet Oral Daily Raylene Miyamoto, MD   1 tablet at 10/16/13 1013  . pantoprazole (PROTONIX) EC tablet 40 mg  40 mg Oral QHS Darnell Level Mancheril, RPH   40 mg at 10/15/13 2259  . sodium chloride 0.9 % injection 3 mL  3 mL Intravenous Q12H Marianne L York, PA-C      . sodium chloride 0.9 % injection 3 mL  3 mL Intravenous PRN Melton Alar, PA-C      . thiamine (VITAMIN B-1) tablet 100 mg  100 mg Oral Daily Darnell Level Mancheril, RPH   100 mg at 10/16/13 1013    Psychiatric Specialty Exam: Physical Exam  ROS  Blood pressure 122/83, pulse 84, temperature 98.2 F (36.8 C), temperature source Oral, resp. rate 18, height 5' 9" (1.753 m), weight 59.2 kg (130 lb 8.2 oz), SpO2 100.00%.Body mass index is 19.26 kg/(m^2).  General Appearance: Disheveled and Guarded  Eye  Contact::  Fair  Speech:  Clear and Coherent and Slow  Volume:  Decreased  Mood:  Anxious and Depressed  Affect:  Depressed and Tearful  Thought Process:  Coherent and Goal Directed  Orientation:  Full (Time, Place, and Person)  Thought Content:  Rumination  Suicidal Thoughts:  Yes.  with intent/plan  Homicidal Thoughts:  No  Memory:  Immediate;   Fair Recent;   Fair  Judgement:  Intact  Insight:  Fair  Psychomotor Activity:  Decreased  Concentration:  Fair  Recall:  [employer] of Knowledge:Fair  Language: Fair  Akathisia:  NA  Handed:  Right  AIMS (if indicated):     Assets:  Communication Skills Desire for Improvement Housing Intimacy Leisure Time Resilience Social Support  Sleep:      Musculoskeletal: Strength & Muscle Tone: within normal limits Gait & Station: unable to stand Patient leans: N/A  Treatment Plan Summary: Daily contact with patient to assess and evaluate symptoms and progress in treatment Medication management  ,JANARDHAHA R. 10/16/2013 2:36 PM

## 2013-10-16 NOTE — Clinical Social Work Psych Note (Signed)
Pt has been transferred from 72M to 5W.  Psych CSW is continuing to follow.  Pt has become combative, aggressive (physically/verbally) and agitated.  Upon being ready for dc, MD Jonnalagadda (psychiatrist) is recommending inpatient psychiatric hospitalization.   Pt is currently under Involuntary Commitment (IVC) and is unable to leave the hospital without having this paperwork rescinded.    Psych CSW has spoken with charge at [facility] and [facility] is unable to accept pt at this time due to acuity and current milieu.  Psych CSW has sent referral to [facility] and will begin local psychiatric facility search once pt is medically cleared.  Psych CSW left message for representative at [facility] to obtain a [facility] authorization #.  Psych CSW is currently awaiting a return call.  Nonnie Done, [place] [phone]  Clinical Social Work

## 2013-10-16 NOTE — Consult Note (Signed)
Reason for Consult: Tachycardia  Requesting Physician: Candiss Norse  Cardiologist: None  HPI: This is a 43 y.o. male with a past medical history significant for alcohol and cocaine abuse, depression and attempted suicide admitted after an intentional clonidine and flexeril overdose. He required sedation with precedex for aggression.  His chart mentions a diagnosis of WPW and the patient reports a childhood heart murmur.  Consulted for SVT.  Several episodes of rapid narrow complex tachycardia occur throughout the day. Consistently, these have had gradual onset and gradual resolution and they are often associated with vertical position (standing or walking).. The P waves are identical with the sinus P waves during normal rates.  One of his ECG tracings on this admission shows limb lead reversal. Other ECGs are performed with correct limb placement. His PR is relatively short, but the QRS complex is normal - there is no delta wave on any of the tracings.   The diagnosis of WPW was postulated based on the ST-T changes on his ECGs last November. The QRS morphology was normal then also, so I disagree with that assessment. The ST-T abnormalities are present on all his tracings, going back to 2013, with varying amplitude of T wave inversion.  Echo, nuclear perfusion study and coronary angio 2014 show no evidence of structural heart disease.  PMHx:  Past Medical History  Diagnosis Date  . Hypertension   . Asthma   . Pancreatitis   . Cocaine abuse   . Depression   . H/O suicide attempt   . Heart murmur     "when he was little" (03/06/2013)  . Shortness of breath     "can happen at anytime" (03/06/2013)  . Anemia   . H/O hiatal hernia   . GERD (gastroesophageal reflux disease)   . Seizures     "weekly" (03/06/2013)  . Arthritis     "knees" (03/06/2013)  . Anxiety   . WPW (Wolff-Parkinson-White syndrome)     [name] 03/06/2013   Past Surgical History  Procedure Laterality Date   . Facial fracture surgery Left 1990's    "result of trauma" (03/06/2013)  . Eye surgery Left 1990's    "result of trauma" (03/06/2013)    FAMHx: Family History  Problem Relation Age of Onset  . Hypertension Other   . Coronary artery disease Other     SOCHx:  reports that he has been smoking Cigarettes.  He has a 30 pack-year smoking history. He uses smokeless tobacco. He reports that he drinks about 25.2 ounces of alcohol per week. He reports that he uses illicit drugs (Cocaine and Marijuana).  ALLERGIES: Allergies  Allergen Reactions  . Trazodone And Nefazodone Other (See Comments)    Muscle spasms  . Shellfish-Derived Products Nausea And Vomiting    ROS: Refuses to answer most of my question. ROS from chart  HOME MEDICATIONS: Prescriptions prior to admission  Medication Sig Dispense Refill  . acetaminophen (TYLENOL) 500 MG tablet Take 1,000 mg by mouth every 6 (six) hours as needed for mild pain.       [name] albuterol (PROVENTIL HFA;VENTOLIN HFA) 108 (90 BASE) MCG/ACT inhaler Inhale 1-2 puffs into the lungs every 6 (six) hours as needed for wheezing or shortness of breath.      . cloNIDine (CATAPRES) 0.1 MG tablet Take 0.1 mg by mouth 2 (two) times daily.      . cyclobenzaprine (FLEXERIL) 10 MG tablet Take 1 tablet (10 mg total) by mouth 3 (three) times daily as needed  for muscle spasms.  30 tablet  0  . lansoprazole (PREVACID) 15 MG capsule Take 15 mg by mouth 2 (two) times daily as needed (for reflux).      . naproxen (NAPROSYN) 375 MG tablet Take 1 tablet (375 mg total) by mouth 2 (two) times daily.  20 tablet  0  . ondansetron (ZOFRAN ODT) 4 MG disintegrating tablet Take 1 tablet (4 mg total) by mouth every 8 (eight) hours as needed for nausea or vomiting.  20 tablet  0  . oxyCODONE-acetaminophen (PERCOCET/ROXICET) 5-325 MG per tablet Take 1 tablet by mouth every 6 (six) hours as needed for severe pain.  20 tablet  0  . QUEtiapine Fumarate (SEROQUEL XR) 150 MG 24 hr tablet  Take 150 mg by mouth at bedtime.        HOSPITAL MEDICATIONS: Prior to Admission:  Prescriptions prior to admission  Medication Sig Dispense Refill  . acetaminophen (TYLENOL) 500 MG tablet Take 1,000 mg by mouth every 6 (six) hours as needed for mild pain.       [name] albuterol (PROVENTIL HFA;VENTOLIN HFA) 108 (90 BASE) MCG/ACT inhaler Inhale 1-2 puffs into the lungs every 6 (six) hours as needed for wheezing or shortness of breath.      . cloNIDine (CATAPRES) 0.1 MG tablet Take 0.1 mg by mouth 2 (two) times daily.      . cyclobenzaprine (FLEXERIL) 10 MG tablet Take 1 tablet (10 mg total) by mouth 3 (three) times daily as needed for muscle spasms.  30 tablet  0  . lansoprazole (PREVACID) 15 MG capsule Take 15 mg by mouth 2 (two) times daily as needed (for reflux).      . naproxen (NAPROSYN) 375 MG tablet Take 1 tablet (375 mg total) by mouth 2 (two) times daily.  20 tablet  0  . ondansetron (ZOFRAN ODT) 4 MG disintegrating tablet Take 1 tablet (4 mg total) by mouth every 8 (eight) hours as needed for nausea or vomiting.  20 tablet  0  . oxyCODONE-acetaminophen (PERCOCET/ROXICET) 5-325 MG per tablet Take 1 tablet by mouth every 6 (six) hours as needed for severe pain.  20 tablet  0  . QUEtiapine Fumarate (SEROQUEL XR) 150 MG 24 hr tablet Take 150 mg by mouth at bedtime.        VITALS: Blood pressure 122/83, pulse 84, temperature 98.2 F (36.8 C), temperature source Oral, resp. rate 18, height 5\' 9"  (1.753 m), weight 130 lb 8.2 oz (59.2 kg), SpO2 100.00%.  PHYSICAL EXAM:  General: Alert, oriented x3, no distress Head: no evidence of trauma, PERRL, EOMI, no exophtalmos or lid lag, no myxedema, no xanthelasma; normal ears, nose and oropharynx Neck: normal jugular venous pulsations and no hepatojugular reflux; brisk carotid pulses without delay and no carotid bruits Chest: clear to auscultation, no signs of consolidation by percussion or palpation, normal fremitus, symmetrical and full respiratory  excursions Cardiovascular: normal position and quality of the apical impulse, regular rhythm, normal first heart sound and normal second heart sound, no rubs or gallops, no murmur Abdomen: no tenderness or distention, no masses by palpation, no abnormal pulsatility or arterial bruits, normal bowel sounds, no hepatosplenomegaly Extremities: no clubbing, cyanosis;  no edema; 2+ radial, ulnar and brachial pulses bilaterally; 2+ right femoral, posterior tibial and dorsalis pedis pulses; 2+ left femoral, posterior tibial and dorsalis pedis pulses; no subclavian or femoral bruits Neurological: grossly nonfocal   LABS  CBC  Recent Labs  10/14/13 1010  WBC 12.9*  HGB 8.5*  HCT 26.0*  MCV 97.4  PLT 993*   Basic Metabolic Panel  Recent Labs  10/14/13 0220 10/14/13 1010 10/16/13 0652  NA  --  138 141  K  --  3.9 4.4  CL  --  104 106  CO2  --  21 22  GLUCOSE  --  94 86  BUN  --  3* 3*  CREATININE  --  0.63 0.61  CALCIUM  --  8.2* 8.8  MG 1.4*  --  1.8  PHOS 3.6  --  4.7*   Liver Function Tests  Recent Labs  10/14/13 1010 10/16/13 0652  AST 50* 57*  ALT 41 42  ALKPHOS 215* 192*  BILITOT 1.4* 1.3*  PROT 6.5 6.6  ALBUMIN 1.7* 1.9*    Recent Labs  10/14/13 1010 10/16/13 0652  LIPASE 239* 248*     IMAGING: No results found.  ECG: See description above  TELEMETRY: Periods of sinus tachycardia, very rare PVCs  IMPRESSION: 1. Sinus tachycardia, mostly when upright or moving 2. Questionable diagnosis of WPW. All of the episodes of tachycardia are consistent with sinus mechanism, none appear consistent with reentry mechanism  RECOMMENDATION: 1. Sinus tachycardia, likely multifactorial (anemia, pain, emotional distress, withdrawal, maybe still hypovolemic). No specific therapy is necessary, treat the underlying causes. If he was on clonidine long term, rebound should be resolving by now. It may not be wise to restart this drug since he has overdosed on it, unless it  can be strictly supervised.  Time Spent Directly with Patient: 45 minutes  Sanda Klein, MD, [facility] [phone] office [phone] pager   10/16/2013, 1:29 PM

## 2013-10-17 ENCOUNTER — Inpatient Hospital Stay (HOSPITAL_COMMUNITY): Payer: Self-pay

## 2013-10-17 LAB — CBC
HEMATOCRIT: 24.9 % — AB (ref 39.0–52.0)
HEMOGLOBIN: 7.8 g/dL — AB (ref 13.0–17.0)
MCH: 31.2 pg (ref 26.0–34.0)
MCHC: 31.3 g/dL (ref 30.0–36.0)
MCV: 99.6 fL (ref 78.0–100.0)
Platelets: 206 10*3/uL (ref 150–400)
RBC: 2.5 MIL/uL — AB (ref 4.22–5.81)
RDW: 19.4 % — ABNORMAL HIGH (ref 11.5–15.5)
WBC: 12.9 10*3/uL — ABNORMAL HIGH (ref 4.0–10.5)

## 2013-10-17 LAB — COMPREHENSIVE METABOLIC PANEL
ALT: 41 U/L (ref 0–53)
AST: 48 U/L — ABNORMAL HIGH (ref 0–37)
Albumin: 1.9 g/dL — ABNORMAL LOW (ref 3.5–5.2)
Alkaline Phosphatase: 176 U/L — ABNORMAL HIGH (ref 39–117)
BILIRUBIN TOTAL: 1.1 mg/dL (ref 0.3–1.2)
BUN: 4 mg/dL — AB (ref 6–23)
CO2: 23 meq/L (ref 19–32)
CREATININE: 0.64 mg/dL (ref 0.50–1.35)
Calcium: 8.7 mg/dL (ref 8.4–10.5)
Chloride: 105 mEq/L (ref 96–112)
GLUCOSE: 109 mg/dL — AB (ref 70–99)
Potassium: 4.2 mEq/L (ref 3.7–5.3)
Sodium: 140 mEq/L (ref 137–147)
Total Protein: 6.7 g/dL (ref 6.0–8.3)

## 2013-10-17 LAB — URINALYSIS, ROUTINE W REFLEX MICROSCOPIC
Bilirubin Urine: NEGATIVE
Glucose, UA: NEGATIVE mg/dL
Hgb urine dipstick: NEGATIVE
Ketones, ur: NEGATIVE mg/dL
LEUKOCYTES UA: NEGATIVE
NITRITE: NEGATIVE
PROTEIN: NEGATIVE mg/dL
Specific Gravity, Urine: 1.017 (ref 1.005–1.030)
Urobilinogen, UA: 0.2 mg/dL (ref 0.0–1.0)
pH: 5.5 (ref 5.0–8.0)

## 2013-10-17 MED ORDER — QUETIAPINE FUMARATE 100 MG PO TABS: 100.0000 mg | ORAL_TABLET | Freq: Every day | ORAL | Status: DC

## 2013-10-17 MED ORDER — QUETIAPINE FUMARATE 25 MG PO TABS: 25.0000 mg | ORAL_TABLET | Freq: Two times a day (BID) | ORAL | Status: DC

## 2013-10-17 MED ORDER — PANTOPRAZOLE SODIUM 40 MG PO TBEC: 40.0000 mg | DELAYED_RELEASE_TABLET | Freq: Every day | ORAL | Status: DC

## 2013-10-17 MED ORDER — CYCLOBENZAPRINE HCL 5 MG PO TABS: 5.0000 mg | ORAL_TABLET | Freq: Every day | ORAL | Status: DC

## 2013-10-17 MED ORDER — METOPROLOL TARTRATE 50 MG PO TABS: 50.0000 mg | ORAL_TABLET | Freq: Two times a day (BID) | ORAL | Status: DC

## 2013-10-17 MED ORDER — SODIUM CHLORIDE 0.9 % IV BOLUS (SEPSIS)
500.0000 mL | Freq: Once | INTRAVENOUS | Status: AC
  Administered 2013-10-17: 500 mL via INTRAVENOUS

## 2013-10-17 MED ORDER — MENTHOL 3 MG MT LOZG
1.0000 | LOZENGE | OROMUCOSAL | Status: DC | PRN
  Administered 2013-10-17 – 2013-10-21 (×4): 3 mg via ORAL
  Filled 2013-10-17 (×2): qty 9

## 2013-10-17 MED ORDER — LORAZEPAM 1 MG PO TABS
1.0000 mg | ORAL_TABLET | Freq: Four times a day (QID) | ORAL | Status: DC | PRN
  Administered 2013-10-17 – 2013-10-21 (×9): 1 mg via ORAL
  Filled 2013-10-17 (×9): qty 1

## 2013-10-17 MED ORDER — BENZONATATE 100 MG PO CAPS
100.0000 mg | ORAL_CAPSULE | Freq: Three times a day (TID) | ORAL | Status: DC | PRN
  Administered 2013-10-17 – 2013-10-21 (×3): 100 mg via ORAL
  Filled 2013-10-17 (×4): qty 1

## 2013-10-17 NOTE — Progress Notes (Signed)
PATIENT DETAILS Name: [name] Age: 43 y.o. Sex: male Date of Birth: 23-Jul-1970 Admit Date: 10/05/2013 Admitting Physician Phillips Grout, MD VZC:HYIFOY, Gabrielle Dare, MD  Patient is medically cleared for discharge to [facility].  Subjective: Patient reports he had a good night last night.  Had good conversation with his sitter.  No complaints.  Wants d/c to [facility]  Assessment/Plan: Principal Problem:  Suicide attempt -intentional overdose of flexeril, clonidine.monitored in ICU, transferred to [facility] on 10/15/13 -have asked psych to follow up daily. Appreciate their recommendations.  Sitter in place -Psych medications have been adjusted.  Patient is more calm today. -Involuntary commitment.  Active Problems:  Sinus Tach. -Uncertain etiology.  ST Manifests usually when he stands or walks. -Has required IV lopressor on 6/10.  Now improved on metoprolol 50 mg bid.  Will d/c tele. -Cardiology consulted out of concern for SVT.  They recommended BB and hydration.  No further recommendations or work up at this point.  Acute on chronic Pancreatitis -belly soft-mildly tender to palpation in epigastric area -advance diet to low fat.  Patient reports some burning pain at night, but overall improved.  No need for further work up. The [employer] well.  Acute encephalopathy  -2/2 to ETOH withdrawal. > resolved with precedex,  ETOH abuse -no signs of withdrawal currently. -required Precedex and ICU monitoring -c/w MVI,Thiamine and Folate  Protein-calorie malnutrition, severe -c/w supplements  Disposition: Medically cleared to transfer to [facility] when bed is available.  DVT Prophylaxis: Lovneox  Code Status: Full code   Family Communication:  Patient is alert and orientated.   Procedures:  None  CONSULTS: Critical Care. Cardiology Psychiatry.  Time spent 40 minutes-which includes 50% of the time with face-to-face with patient/ family and coordinating  care related to the above assessment and plan.    MEDICATIONS: Scheduled Meds: . cyclobenzaprine  5 mg Oral Daily  . enoxaparin (LOVENOX) injection  40 mg Subcutaneous Q24H  . folic acid  1 mg Oral Daily  . LORazepam  2 mg Intramuscular Once  . metoprolol tartrate  50 mg Oral BID  . multivitamin with minerals  1 tablet Oral Daily  . pantoprazole  40 mg Oral QHS  . QUEtiapine  100 mg Oral QHS  . QUEtiapine  25 mg Oral BID AC  . sodium chloride  3 mL Intravenous Q12H  . thiamine  100 mg Oral Daily   Continuous Infusions: . sodium chloride 250 mL (10/16/13 1501)   PRN Meds:.acetaminophen (TYLENOL) oral liquid 160 mg/5 mL, albuterol, alum & mag hydroxide-simeth, benzonatate, bismuth subsalicylate, LORazepam, menthol-cetylpyridinium, metoprolol, sodium chloride  Antibiotics: Anti-infectives   None       PHYSICAL EXAM: Vital signs in last 24 hours: Filed Vitals:   10/16/13 1124 10/16/13 1311 10/16/13 2137 10/17/13 0540  BP: 128/68 122/83 117/73 114/79  Pulse: 123 84 99 98  Temp:  98.2 F (36.8 C) 98.6 F (37 C) 98.6 F (37 C)  TempSrc:  Oral Oral Oral  Resp:  18 18 17   Height:      Weight:      SpO2:  100% 100% 99%    Weight change:  Filed Weights   10/05/13 2302 10/06/13 0002 10/06/13 0345  Weight: 66.679 kg (147 lb) 79.379 kg (175 lb) 59.2 kg (130 lb 8.2 oz)   Body mass index is 19.26 kg/(m^2).   Gen Exam: Thin, wd, AA male,  Awake and alert with clear speech.  Smiling.  Sitter at  bedside. Neck: Supple, No JVD.   Chest: B/L Clear.  Now w/c/r CVS: S1 S2 Regular, no murmurs.  Abdomen:  thin, firm, BS +, non tender, non distended.  Extremities: no edema, lower extremities warm to touch. Neurologic: Non Focal.   Skin: No Rash.   Psych:  Patient slightly anxious but cooperative and appropriate.   Intake/Output from previous day:  Intake/Output Summary (Last 24 hours) at 10/17/13 1221 Last data filed at 10/17/13 0904  Gross per 24 hour  Intake    480 ml    Output    301 ml  Net    179 ml     LAB RESULTS: CBC  Recent Labs Lab 10/11/13 0430 10/12/13 0324 10/13/13 0259 10/14/13 1010 10/17/13 0635  WBC 7.7 8.6 9.3 12.9* 12.9*  HGB 8.5* 10.9* 10.4* 8.5* 7.8*  HCT 26.5* 33.8* 31.4* 26.0* 24.9*  PLT 71* 73* 105* 123* 206  MCV 101.1* 100.9* 97.8 97.4 99.6  MCH 32.4 32.5 32.4 31.8 31.2  MCHC 32.1 32.2 33.1 32.7 31.3  RDW 24.5* 23.0* 21.0* 20.1* 19.4*    Chemistries   Recent Labs Lab 10/11/13 0430 10/12/13 0324 10/13/13 0259 10/14/13 0220 10/14/13 1010 10/16/13 0652 10/17/13 0635  NA 138 140 140  --  138 141 140  K 4.1 4.3 4.1  --  3.9 4.4 4.2  CL 107 107 106  --  104 106 105  CO2 20 21 22   --  21 22 23   GLUCOSE 105* 90 88  --  94 86 109*  BUN <3* <3* <3*  --  3* 3* 4*  CREATININE 0.46* 0.52 0.53  --  0.63 0.61 0.64  CALCIUM 7.8* 8.4 8.6  --  8.2* 8.8 8.7  MG 1.7 1.6 1.7 1.4*  --  1.8  --       Recent Labs  10/16/13 7616  LIPASE 248*     MICROBIOLOGY: Recent Results (from the past 240 hour(s))  MRSA PCR SCREENING     Status: None   Collection Time    10/09/13  1:00 AM      Result Value Ref Range Status   MRSA by PCR NEGATIVE  NEGATIVE Final   Comment:            The GeneXpert MRSA Assay (FDA     approved for NASAL specimens     only), is one component of a     comprehensive MRSA colonization     surveillance program. It is not     intended to diagnose MRSA     infection nor to guide or     monitor treatment for     MRSA infections.    RADIOLOGY STUDIES/RESULTS: Dg Chest Port 1 View  10/12/2013   CLINICAL DATA:  Pneumonia.  EXAM: PORTABLE CHEST - 1 VIEW  COMPARISON:  10/08/2013  FINDINGS: The cardiac silhouette, mediastinal and hilar contours are stable. Persistent but improved bilateral interstitial and airspace process likely atypical pneumonia. No pleural effusion or pneumothorax.  IMPRESSION: Persistent but improved bilateral interstitial and airspace process.   Electronically Signed   By: Kalman Jewels M.D.   On: 10/12/2013 07:02   Dg Chest Port 1 View  10/08/2013   CLINICAL DATA:  Assess infiltrates.  EXAM: PORTABLE CHEST - 1 VIEW  COMPARISON:  10/06/2013.  FINDINGS: Bilateral irregular interstitial thickening appears increased from the prior study, although this may be in part due to lower lung volumes and a more semi-erect positioning.  No pleural effusion. No pneumothorax.  Cardiac silhouette is normal in size. Normal mediastinal and hilar contours.  IMPRESSION: Increased interstitial thickening. Diffuse bilateral interstitial infiltrates versus interstitial edema. Degree of interstitial thickening has increased when compared the prior study.   Electronically Signed   By: Lajean Manes M.D.   On: 10/08/2013 20:35   Dg Chest Portable 1 View  10/06/2013   CLINICAL DATA:  Drug overdose  EXAM: PORTABLE CHEST - 1 VIEW  COMPARISON:  08/16/2013  FINDINGS: There is interstitial coarsening and mild hypoaeration. Normal heart size and mediastinal contours. No effusion or pneumothorax.  IMPRESSION: Diffuse interstitial prominence, likely from hypoaeration. Cannot exclude mild aspiration pneumonitis.   Electronically Signed   By: Jorje Guild M.D.   On: 10/06/2013 00:56   Dg Abd Portable 1v  10/14/2013   CLINICAL DATA:  Mid to left abdominal pain.  EXAM: PORTABLE ABDOMEN - 1 VIEW  COMPARISON:  10/06/2013  FINDINGS: Bowel gas pattern is nonobstructive. No organomegaly or abnormal calcifications identified. Visualized osseous structures have a normal appearance. Patient is rotated.  IMPRESSION: No evidence for acute  abnormality.   Electronically Signed   By: Shon Hale M.D.   On: 10/14/2013 10:01   Dg Abd Portable 1v  10/06/2013   CLINICAL DATA:  43 year old male drug overdose. Initial encounter.  EXAM: PORTABLE ABDOMEN - 1 VIEW  COMPARISON:  CT Abdomen and Pelvis 4 levin 2014.  FINDINGS: Portable AP supine view at 0046 hrs. Non obstructed bowel gas pattern. Stable right hemipelvis phlebolith.  Evidence of a healed posterior right tenth rib fracture. No acute osseous abnormality identified. No definite pneumoperitoneum on this supine view.  IMPRESSION: Non obstructed bowel gas pattern.   Electronically Signed   By: Lars Pinks M.D.   On: 10/06/2013 00:56    Karen Kitchens [facility] Pager:[phone]  If 7PM-7AM, please contact night-coverage [email or web address] Password [email or web address] 10/17/2013, 12:21 PM   LOS: 12 days   **Disclaimer: This note may have been dictated with voice recognition software. Similar sounding words can inadvertently be transcribed and this note may contain transcription errors which may not have been corrected upon publication of note.**

## 2013-10-17 NOTE — Consult Note (Signed)
[facility] Face-to-Face Psychiatry Consult Follow up   Reason for Consult:  Alcohol intoxication, depression and s/p suicide attempt Referring Physician:  Jonetta Osgood, MD  Richrd Sox Lall is an 43 y.o. male. Total Time spent with patient: 45 minutes  Assessment: AXIS I:  Major Depression, Recurrent severe, Substance Induced Mood Disorder and Alcohol dependence AXIS II:  Deferred AXIS III:   Past Medical History  Diagnosis Date  . Hypertension   . Asthma   . Pancreatitis   . Cocaine abuse   . Depression   . H/O suicide attempt   . Heart murmur     "when he was little" (03/06/2013)  . Shortness of breath     "can happen at anytime" (03/06/2013)  . Anemia   . H/O hiatal hernia   . GERD (gastroesophageal reflux disease)   . Seizures     "weekly" (03/06/2013)  . Arthritis     "knees" (03/06/2013)  . Anxiety   . WPW (Wolff-Parkinson-White syndrome)     [name] 03/06/2013   AXIS IV:  economic problems, occupational problems, other psychosocial or environmental problems, problems related to social environment and problems with primary support group AXIS V:  41-50 serious symptoms  Plan:  Patient continued to receive IV fluids,  Continue Seroquel 25 mg BID and 100 mg Qhs for agitation and aggression Recommend psychiatric Inpatient admission when medically cleared. Supportive therapy provided about ongoing stressors. Continuous safety sitter and current medication management Appreciate psychiatric consultation Please contact [phone] if needs further assistance  Subjective:   [name] is a 43 y.o. male patient admitted with suicide attempt and alcohol intoxication.  HPI:  Patient was seen and chart reviewed. Psychiatric consultation was requested a suicide attempt while intoxicated. This is a 43 yo male with history of alcohol abuse versus dependence, elevated liver enzymes, depression/anxiety comes in after intentionally ingesting some of his home meds to kill himself  per intial report he told the emergency department on arrival. Patient was disheveled, poorly groomed and denies current symptoms of depression, anxiety and psychosis. Patient was placed in the 4 point restraints as he is being combative and removing catheters and intravenous tubes. He tells me he did this to try to get high off of the flexeril. He says he took 15 pills, combination of some of his flexeril and some of his clonidine. His girlfriend is pregnant and is planning on getting an abortion tomorrow, and they were argueing about that, and he got very upset and took a bunch of his pills. This occurred at about 10pm. He received charcoal in the emergency department. He also reports he has been drinking beer.   Interval history: The patient was seen today for psychiatric consultation follow up. Patient mother was at bed side and strongly support him to go to further treatment and rehab services. Patient has agree after brief discussion. Patient denied current symptoms of depression, anxiety and suicidal ideations. He has status post suicidal attempt. Patient stated that in right frame of mind he does not want to kill him self. He has stressed about not abl to hold a job, relationship and history of legal charges and being on probation etc. Patient has been feeling much better and wants to go home with the plan of residential substance abuse treatment program at [facility] recovery services. Patient mother does not believe he can stay sober due to previous history. He stated he understands about his situation with his girlfriend and does not want to continue to  suffer with it.   Patient reportedly has conflict with his baby's mother who was pregnant at 4 months gestation. Patient mother is living in [place] and came to here and spend a few months the lost 6 months to one year. Reportedly patient met her on Facebook. Patient stopped drinking 3 weeks ago. Patient stated that his baby his mother threatened about going  for an abortion because she does not believe patient can support the pregnancy and the baby. Patient reported he feels depressed about not having the job and finances to support them. 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 Patient requesting treatment for depression, anxiety and also interested to participate alcohol rehabilitation treatment at [employer] recovery service if possible. Patient family especially mother and father and a sister visiting him while in the hospital. Patient has completed alcohol detox treatment with [facility]. His blood alcohol level in the emergency department his 298 on arrival. Patient had alcohol detox, rehabilitation treatment 2012 at [facility] which was now [facility].   HPI Elements:   Location:  Depression and alcohol abuse. Quality:  poor. Severity:  Acute. Timing:  Suicide attempt and intoxication.  Past Psychiatric History: Past Medical History  Diagnosis Date  . Hypertension   . Asthma   . Pancreatitis   . Cocaine abuse   . Depression   . H/O suicide attempt   . Heart murmur     "when he was little" (03/06/2013)  . Shortness of breath     "can happen at anytime" (03/06/2013)  . Anemia   . H/O hiatal hernia   . GERD (gastroesophageal reflux disease)   . Seizures     "weekly" (03/06/2013)  . Arthritis     "knees" (03/06/2013)  . Anxiety   . WPW (Wolff-Parkinson-White syndrome)     [name] 03/06/2013    reports that he has been smoking Cigarettes.  He has a 30 pack-year smoking history. He uses smokeless tobacco. He reports that he drinks about 25.2 ounces of alcohol per week. He reports that he uses illicit drugs (Cocaine and Marijuana). Family History  Problem Relation Age of Onset  . Hypertension Other   . Coronary artery disease Other      Living Arrangements: Parent   Abuse/Neglect [facility]) Physical Abuse: Denies Verbal Abuse: Denies Sexual Abuse: Denies Allergies:   Allergies  Allergen  Reactions  . Trazodone And Nefazodone Other (See Comments)    Muscle spasms  . Shellfish-Derived Products Nausea And Vomiting    ACT Assessment Complete:  No Objective: Blood pressure 118/77, pulse 89, temperature 99 F (37.2 C), temperature source Oral, resp. rate 18, height _0  (1.753 m), weight 59.2 kg (130 lb 8.2 oz), SpO2 100.00%.Body mass index is 19.26 kg/(m^2). Results for orders placed during the hospital encounter of 10/05/13 (from the past 72 hour(s))  MAGNESIUM     Status: None   Collection Time    10/16/13  6:52 AM      Result Value Ref Range   Magnesium 1.8  1.5 - 2.5 mg/dL  PHOSPHORUS     Status: Abnormal   Collection Time    10/16/13  6:52 AM      Result Value Ref Range   Phosphorus 4.7 (*) 2.3 - 4.6 mg/dL  BASIC METABOLIC PANEL     Status: Abnormal   Collection Time    10/16/13  6:52 AM      Result Value Ref Range   Sodium 141  137 - 147 mEq/L   Potassium 4.4  3.7 - 5.3 mEq/L   Chloride 106  96 - 112 mEq/L   CO2 22  19 - 32 mEq/L   Glucose, Bld 86  70 - 99 mg/dL   BUN 3 (*) 6 - 23 mg/dL   Creatinine, Ser 0.61  0.50 - 1.35 mg/dL   Calcium 8.8  8.4 - 10.5 mg/dL   GFR calc non Af Amer >90  >90 mL/min   GFR calc Af Amer >90  >90 mL/min   Comment: (NOTE)     The eGFR has been calculated using the CKD EPI equation.     This calculation has not been validated in all clinical situations.     eGFR's persistently <90 mL/min signify possible Chronic Kidney     Disease.  LIPASE, BLOOD     Status: Abnormal   Collection Time    10/16/13  6:52 AM      Result Value Ref Range   Lipase 248 (*) 11 - 59 U/L  HEPATIC FUNCTION PANEL     Status: Abnormal   Collection Time    10/16/13  6:52 AM      Result Value Ref Range   Total Protein 6.6  6.0 - 8.3 g/dL   Albumin 1.9 (*) 3.5 - 5.2 g/dL   AST 57 (*) 0 - 37 U/L   ALT 42  0 - 53 U/L   Alkaline Phosphatase 192 (*) 39 - 117 U/L   Total Bilirubin 1.3 (*) 0.3 - 1.2 mg/dL   Bilirubin, Direct 0.6 (*) 0.0 - 0.3 mg/dL    Indirect Bilirubin 0.7  0.3 - 0.9 mg/dL  CBC     Status: Abnormal   Collection Time    10/17/13  6:35 AM      Result Value Ref Range   WBC 12.9 (*) 4.0 - 10.5 K/uL   RBC 2.50 (*) 4.22 - 5.81 MIL/uL   Hemoglobin 7.8 (*) 13.0 - 17.0 g/dL   HCT 24.9 (*) 39.0 - 52.0 %   MCV 99.6  78.0 - 100.0 fL   MCH 31.2  26.0 - 34.0 pg   MCHC 31.3  30.0 - 36.0 g/dL   RDW 19.4 (*) 11.5 - 15.5 %   Platelets 206  150 - 400 K/uL  COMPREHENSIVE METABOLIC PANEL     Status: Abnormal   Collection Time    10/17/13  6:35 AM      Result Value Ref Range   Sodium 140  137 - 147 mEq/L   Potassium 4.2  3.7 - 5.3 mEq/L   Chloride 105  96 - 112 mEq/L   CO2 23  19 - 32 mEq/L   Glucose, Bld 109 (*) 70 - 99 mg/dL   BUN 4 (*) 6 - 23 mg/dL   Creatinine, Ser 0.64  0.50 - 1.35 mg/dL   Calcium 8.7  8.4 - 10.5 mg/dL   Total Protein 6.7  6.0 - 8.3 g/dL   Albumin 1.9 (*) 3.5 - 5.2 g/dL   AST 48 (*) 0 - 37 U/L   ALT 41  0 - 53 U/L   Alkaline Phosphatase 176 (*) 39 - 117 U/L   Total Bilirubin 1.1  0.3 - 1.2 mg/dL   GFR calc non Af Amer >90  >90 mL/min   GFR calc Af Amer >90  >90 mL/min   Comment: (NOTE)     The eGFR has been calculated using the CKD EPI equation.     This calculation  has not been validated in all clinical situations.     eGFR's persistently <90 mL/min signify possible Chronic Kidney     Disease.  URINALYSIS, ROUTINE W REFLEX MICROSCOPIC     Status: None   Collection Time    10/17/13  8:06 AM      Result Value Ref Range   Color, Urine YELLOW  YELLOW   APPearance CLEAR  CLEAR   Specific Gravity, Urine 1.017  1.005 - 1.030   pH 5.5  5.0 - 8.0   Glucose, UA NEGATIVE  NEGATIVE mg/dL   Hgb urine dipstick NEGATIVE  NEGATIVE   Bilirubin Urine NEGATIVE  NEGATIVE   Ketones, ur NEGATIVE  NEGATIVE mg/dL   Protein, ur NEGATIVE  NEGATIVE mg/dL   Urobilinogen, UA 0.2  0.0 - 1.0 mg/dL   Nitrite NEGATIVE  NEGATIVE   Leukocytes, UA NEGATIVE  NEGATIVE   Comment: MICROSCOPIC NOT DONE ON URINES WITH NEGATIVE  PROTEIN, BLOOD, LEUKOCYTES, NITRITE, OR GLUCOSE <1000 mg/dL.   Labs are reviewed and are pertinent for alcohol intoxication.  Current Facility-Administered Medications  Medication Dose Route Frequency Provider Last Rate Last Dose  . 0.9 %  sodium chloride infusion  250 mL Intravenous Continuous [name], MD 100 mL/hr at 10/16/13 1501 250 mL at 10/16/13 1501  . acetaminophen (TYLENOL) solution 650 mg  650 mg Oral Q6H PRN Juanito Doom, MD   650 mg at 10/16/13 2209  . albuterol (PROVENTIL) (2.5 MG/3ML) 0.083% nebulizer solution 2.5 mg  2.5 mg Inhalation Q2H PRN Chesley Mires, MD      . alum & mag hydroxide-simeth (MAALOX/MYLANTA) 200-200-20 MG/5ML suspension 30 mL  30 mL Oral Q4H PRN Melton Alar, PA-C   30 mL at 10/16/13 2036  . benzonatate (TESSALON) capsule 100 mg  100 mg Oral TID PRN [name], MD   100 mg at 10/17/13 1542  . bismuth subsalicylate (PEPTO BISMOL) 262 MG/15ML suspension 30 mL  30 mL Oral Q4H PRN Juanito Doom, MD      . cyclobenzaprine (FLEXERIL) tablet 5 mg  5 mg Oral Daily [name], MD   5 mg at 10/17/13 1017  . enoxaparin (LOVENOX) injection 40 mg  40 mg Subcutaneous Q24H Jonetta Osgood, MD   40 mg at 10/16/13 1456  . folic acid (FOLVITE) tablet 1 mg  1 mg Oral Daily Darnell Level Mancheril, RPH   1 mg at 10/17/13 1018  . LORazepam (ATIVAN) injection 1 mg  1 mg Intravenous Q6H PRN Juanito Doom, MD   1 mg at 10/17/13 0706  . LORazepam (ATIVAN) injection 2 mg  2 mg Intramuscular Once Gardiner Barefoot, NP      . menthol-cetylpyridinium (CEPACOL) lozenge 3 mg  1 lozenge Oral PRN [name], MD   3 mg at 10/17/13 1538  . metoprolol (LOPRESSOR) injection 2.5 mg  2.5 mg Intravenous Q5 min PRN Melton Alar, PA-C      . metoprolol (LOPRESSOR) tablet 50 mg  50 mg Oral BID Melton Alar, PA-C   50 mg at 10/17/13 1017  . multivitamin with minerals tablet 1 tablet  1 tablet Oral Daily Raylene Miyamoto, MD   1 tablet at 10/17/13 1018  .  pantoprazole (PROTONIX) EC tablet 40 mg  40 mg Oral QHS Darnell Level Mancheril, RPH   40 mg at 10/16/13 2207  . QUEtiapine (SEROQUEL) tablet 100 mg  100 mg Oral QHS Durward Parcel, MD   100 mg at 10/16/13  2207  . QUEtiapine (SEROQUEL) tablet 25 mg  25 mg Oral BID AC [name], MD   25 mg at 10/17/13 1635  . sodium chloride 0.9 % injection 3 mL  3 mL Intravenous Q12H Marianne L York, PA-C   3 mL at 10/17/13 1017  . sodium chloride 0.9 % injection 3 mL  3 mL Intravenous PRN Melton Alar, PA-C      . thiamine (VITAMIN B-1) tablet 100 mg  100 mg Oral Daily Darnell Level Mancheril, RPH   100 mg at 10/17/13 1018    Psychiatric Specialty Exam: Physical Exam  ROS  Blood pressure 118/77, pulse 89, temperature 99 F (37.2 C), temperature source Oral, resp. rate 18, height _0  (1.753 m), weight 59.2 kg (130 lb 8.2 oz), SpO2 100.00%.Body mass index is 19.26 kg/(m^2).  General Appearance: Disheveled and Guarded  Engineer, water::  Fair  Speech:  Clear and Coherent and Slow  Volume:  Decreased  Mood:  Anxious and Depressed  Affect:  Depressed and Tearful  Thought Process:  Coherent and Goal Directed  Orientation:  Full (Time, Place, and Person)  Thought Content:  Rumination  Suicidal Thoughts:  Yes.  with intent/plan  Homicidal Thoughts:  No  Memory:  Immediate;   Fair Recent;   Fair  Judgement:  Intact  Insight:  Fair  Psychomotor Activity:  Decreased  Concentration:  Fair  Recall:  [employer] of Knowledge:Fair  Language: Fair  Akathisia:  NA  Handed:  Right  AIMS (if indicated):     Assets:  Communication Skills Desire for Improvement Housing Intimacy Leisure Time Resilience Social Support  Sleep:      Musculoskeletal: Strength & Muscle Tone: within normal limits Gait & Station: unable to stand Patient leans: N/A  Treatment Plan Summary: Daily contact with patient to assess and evaluate symptoms and progress in treatment Medication  management  Devonia Farro,JANARDHAHA R. 10/17/2013 5:31 PM

## 2013-10-17 NOTE — Care Management Note (Addendum)
    Page 1 of 1   10/23/2013     8:36:28 AM CARE MANAGEMENT NOTE 10/23/2013  Patient:  [name], [name]   Account Number:  [id number]  Date Initiated:  10/07/2013  Documentation initiated by:  Marvetta Gibbons  Subjective/Objective Assessment:   Pt admitted with Suicidal ideation,  Intentional prescription drug OD  Psych following pt placed under IVC     Action/Plan:   PTA pt lived at home   Anticipated DC Date:  10/17/2013   Anticipated DC Plan:  [place] referral  Clinical Social Worker      [place]  CM consult      Choice offered to / List presented to:             Status of service:  Completed, signed off Medicare Important Message given?   (If response is "NO", the following Medicare IM given date fields will be blank) Date Medicare IM given:   Date Additional Medicare IM given:    Discharge Disposition:  [place]  Per UR Regulation:  Reviewed for med. necessity/level of care/duration of stay  If discussed at [place] of Stay Meetings, dates discussed:    Comments:  10/23/13 [place], BSN  [phone] patient dc to [facility] on 6/16.

## 2013-10-17 NOTE — Discharge Summary (Signed)
  I have directly reviewed the clinical findings, lab, imaging studies and management of this patient in detail. I have interviewed and examined the patient and agree with the documentation,  as recorded by the Physician extender.  [name] M.D on 10/17/2013 at 2:53 PM  [facility] Office  [phone]

## 2013-10-17 NOTE — Progress Notes (Addendum)
Consult note reviewed with Dr. Claiborne Billings. Tele continues to show only sinus tach, not SVT. No new recs. Please call with questions. The patient is welcome to follow up with [place] at discharge or Dr. Doylene Canard who has also seen him. Please contact our team at discharge to assist with this. Esbeydi Manago PA-C

## 2013-10-17 NOTE — Progress Notes (Signed)
  I have directly reviewed the clinical findings, lab, imaging studies and management of this patient in detail. I have interviewed and examined the patient and agree with the documentation,  as recorded by the Physician extender.  [name] M.D on 10/17/2013 at 2:53 PM  [facility] Office  [phone]

## 2013-10-17 NOTE — Clinical Social Work Note (Signed)
[facility] referrals have been sent. Psych CSW waiting for response from facilities.   Liz Beach MSW, [place], [place], [phone]

## 2013-10-17 NOTE — Discharge Summary (Addendum)
Physician Discharge Summary  [name] YJE:[id number] DOB: 1970-09-26 DOA: 10/05/2013  PCP: Angelica Chessman, MD  Admit date: 10/05/2013 Discharge date: 10/22/2013  Time spent: 60 minutes  Recommendations for Outpatient Follow-up:  1. PCP follow up.  Patient started on Metoprolol 50 mg bid this admit 2. Normocytic anemia  With no frank blood loss. 3. BMET / CBC in 1-2 weeks.  Discharge Diagnoses:  Principal Problem:   Suicide attempt Active Problems:   ALCOHOL ABUSE   SUBSTANCE ABUSE, MULTIPLE   LIVER FUNCTION TESTS, ABNORMAL, HX OF   [name] Parkinson White pattern seen on electrocardiogram   Encephalopathy   Overdose   Protein-calorie malnutrition, severe   Discharge Condition: Stable to transfer to [facility].  Diet recommendation: regular diet  Filed Weights   10/05/13 2302 10/06/13 0002 10/06/13 0345  Weight: 66.679 kg (147 lb) 79.379 kg (175 lb) 59.2 kg (130 lb 8.2 oz)    History of present illness:  43 year old male with past medical history of polysubstance abuse, seizures, depression, pancreatitis, and Wolff-Parkinson-White, was admitted after intentional overdose, suicide attempt, using Flexeril and clonidine. He reportedly tried to kill himself because his girlfriend was seeking an abortion.    Hospital Course:   Suicide attempt  -intentional overdose of flexeril, clonidine.  -Psychiatry was consulted and followed the patient daily making appropriate medication changes. -Sitter remained at bedside. Suicide precautions were implemented  -The patient was involuntarily committed and will be transferred to behavioral health at discharge  Active Problems:  Sinus tachycardia versus / SVT  -The patient experienced rapid heartbeat when standing and walking. -Uncertain etiology. Reportedly has a history of Wolff-Parkinson-White. - He required  IV lopressor when necessary. Metoprolol was started and then increased to 50 mg bid 6/10.  - On 6/11 his  heart rate remained stable in the low 110. Telemetry was discontinued. - Per the chart the patient has a history of sinus tach over the past 6 months. - Cardiology was consulted they had no further recommendations other than IV hydration and beta blockers. - Recommend outpatient followup as he has been started on beta blockers.   Acute on chronic Pancreatitis  -The patient complained of abdominal pain his lipase was mildly elevated -belly soft-mildly tender to palpation in epigastric area  - He was treated with supportive care and clear liquid diet. -We were able to slowly advance his diet. On June 10th he was able to tolerate a low-fat diet with no difficulty.   Acute encephalopathy secondary to alcohol abuse -2/2 to ETOH withdrawal. -Became so acute he required transfer to the critical care service  -Symptoms resolved with precedex -He has been treated with thiamine, folate, and a multivitamin  Protein-calorie malnutrition, severe  -Protein supplements were recommended by nutrition and added to his diet  Mild leukocytosis -WBC was 12+ on June 10 and 11th. -Chest x-ray negative -Urinalysis negative for infection -Patient is afebrile and asymptomatic.   Consultations:  Psychiatry  Cardiology  Discharge Exam: Filed Vitals:   10/22/13 1340  BP: 121/75  Pulse: 94  Temp: 99.1 F (37.3 C)  Resp: 16   Gen Exam: Thin, wd, AA male, Awake and alert with clear speech. Smiling. Sitter at bedside.  Neck: Supple, No JVD.  Chest: B/L Clear. Now w/c/r  CVS: S1 S2 Regular, no murmurs.  Abdomen: thin, firm, BS +, non tender, non distended.  Extremities: no edema, lower extremities warm to touch.  Neurologic: Non Focal.  Skin: No Rash.  Psych: Patient slightly anxious but cooperative  and appropriate.    Discharge Instructions .      Discharge Instructions   Diet - low sodium heart healthy    Complete by:  As directed      Diet - low sodium heart healthy    Complete by:  As  directed      Increase activity slowly    Complete by:  As directed      Increase activity slowly    Complete by:  As directed             Medication List    STOP taking these medications       cloNIDine 0.1 MG tablet  Commonly known as:  CATAPRES     cyclobenzaprine 10 MG tablet  Commonly known as:  FLEXERIL     oxyCODONE-acetaminophen 5-325 MG per tablet  Commonly known as:  PERCOCET/ROXICET     SEROQUEL XR 150 MG 24 hr tablet  Generic drug:  QUEtiapine Fumarate  Replaced by:  QUEtiapine 100 MG tablet      TAKE these medications       acetaminophen 500 MG tablet  Commonly known as:  TYLENOL  Take 1,000 mg by mouth every 6 (six) hours as needed for mild pain.     albuterol 108 (90 BASE) MCG/ACT inhaler  Commonly known as:  PROVENTIL HFA;VENTOLIN HFA  Inhale 1-2 puffs into the lungs every 6 (six) hours as needed for wheezing or shortness of breath.     feeding supplement (ENSURE COMPLETE) Liqd  Take 237 mLs by mouth 2 (two) times daily between meals.     lipase/protease/amylase 12000 UNITS Cpep capsule  Commonly known as:  CREON-12/PANCREASE  Take 1 capsule by mouth 3 (three) times daily with meals.     LORazepam 1 MG tablet  Commonly known as:  ATIVAN  Take 1 tablet (1 mg total) by mouth every 6 (six) hours as needed for anxiety.     metoprolol 50 MG tablet  Commonly known as:  LOPRESSOR  Take 1 tablet (50 mg total) by mouth 2 (two) times daily.     naproxen 375 MG tablet  Commonly known as:  NAPROSYN  Take 1 tablet (375 mg total) by mouth 2 (two) times daily.     nicotine 14 mg/24hr patch  Commonly known as:  NICODERM CQ - dosed in mg/24 hours  Place 1 patch (14 mg total) onto the skin daily.     ondansetron 4 MG disintegrating tablet  Commonly known as:  ZOFRAN ODT  Take 1 tablet (4 mg total) by mouth every 8 (eight) hours as needed for nausea or vomiting.     pantoprazole 40 MG tablet  Commonly known as:  PROTONIX  Take 1 tablet (40 mg total) by  mouth at bedtime.     PREVACID 15 MG capsule  Generic drug:  lansoprazole  Take 15 mg by mouth 2 (two) times daily as needed (for reflux).     QUEtiapine 100 MG tablet  Commonly known as:  SEROQUEL  Take 1 tablet (100 mg total) by mouth at bedtime.     QUEtiapine 25 MG tablet  Commonly known as:  SEROQUEL  Take 1 tablet (25 mg total) by mouth 2 (two) times daily before a meal.       Allergies  Allergen Reactions  . Trazodone And Nefazodone Other (See Comments)    Muscle spasms  . Shellfish-Derived Products Nausea And Vomiting      The results of significant diagnostics from this hospitalization (  including imaging, microbiology, ancillary and laboratory) are listed below for reference.    Significant Diagnostic Studies: Dg Chest 2 View  10/17/2013   CLINICAL DATA:  Cough  EXAM: CHEST  2 VIEW  COMPARISON:  10/12/2013  FINDINGS: Cardiac silhouette is normal in size. Normal mediastinal and hilar contours.  Lungs are clear. Previously seen interstitial airspace opacities have resolved.  No pleural effusion or pneumothorax.  Bony thorax is intact.  IMPRESSION: No active cardiopulmonary disease.   Electronically Signed   By: Lajean Manes M.D.   On: 10/17/2013 08:24   Dg Chest Port 1 View  10/12/2013   CLINICAL DATA:  Pneumonia.  EXAM: PORTABLE CHEST - 1 VIEW  COMPARISON:  10/08/2013  FINDINGS: The cardiac silhouette, mediastinal and hilar contours are stable. Persistent but improved bilateral interstitial and airspace process likely atypical pneumonia. No pleural effusion or pneumothorax.  IMPRESSION: Persistent but improved bilateral interstitial and airspace process.   Electronically Signed   By: Kalman Jewels M.D.   On: 10/12/2013 07:02   Dg Chest Port 1 View  10/08/2013   CLINICAL DATA:  Assess infiltrates.  EXAM: PORTABLE CHEST - 1 VIEW  COMPARISON:  10/06/2013.  FINDINGS: Bilateral irregular interstitial thickening appears increased from the prior study, although this may be in  part due to lower lung volumes and a more semi-erect positioning.  No pleural effusion. No pneumothorax. Cardiac silhouette is normal in size. Normal mediastinal and hilar contours.  IMPRESSION: Increased interstitial thickening. Diffuse bilateral interstitial infiltrates versus interstitial edema. Degree of interstitial thickening has increased when compared the prior study.   Electronically Signed   By: Lajean Manes M.D.   On: 10/08/2013 20:35   Dg Chest Portable 1 View  10/06/2013   CLINICAL DATA:  Drug overdose  EXAM: PORTABLE CHEST - 1 VIEW  COMPARISON:  08/16/2013  FINDINGS: There is interstitial coarsening and mild hypoaeration. Normal heart size and mediastinal contours. No effusion or pneumothorax.  IMPRESSION: Diffuse interstitial prominence, likely from hypoaeration. Cannot exclude mild aspiration pneumonitis.   Electronically Signed   By: Jorje Guild M.D.   On: 10/06/2013 00:56   Dg Abd Portable 1v  10/14/2013   CLINICAL DATA:  Mid to left abdominal pain.  EXAM: PORTABLE ABDOMEN - 1 VIEW  COMPARISON:  10/06/2013  FINDINGS: Bowel gas pattern is nonobstructive. No organomegaly or abnormal calcifications identified. Visualized osseous structures have a normal appearance. Patient is rotated.  IMPRESSION: No evidence for acute  abnormality.   Electronically Signed   By: Shon Hale M.D.   On: 10/14/2013 10:01   Dg Abd Portable 1v  10/06/2013   CLINICAL DATA:  43 year old male drug overdose. Initial encounter.  EXAM: PORTABLE ABDOMEN - 1 VIEW  COMPARISON:  CT Abdomen and Pelvis 4 levin 2014.  FINDINGS: Portable AP supine view at 0046 hrs. Non obstructed bowel gas pattern. Stable right hemipelvis phlebolith. Evidence of a healed posterior right tenth rib fracture. No acute osseous abnormality identified. No definite pneumoperitoneum on this supine view.  IMPRESSION: Non obstructed bowel gas pattern.   Electronically Signed   By: Lars Pinks M.D.   On: 10/06/2013 00:56    Microbiology: No results  found for this or any previous visit (from the past 240 hour(s)).   Labs: Basic Metabolic Panel:  Recent Labs Lab 10/16/13 0652 10/17/13 0635 10/19/13 0825 10/20/13 0859  NA 141 140 136* 139  K 4.4 4.2 4.5 4.4  CL 106 105 98 102  CO2 22 23 26 25   GLUCOSE 86  109* 91 70  BUN 3* 4* 4* 5*  CREATININE 0.61 0.64 0.56 0.62  CALCIUM 8.8 8.7 9.1 8.6  MG 1.8  --   --   --   PHOS 4.7*  --   --   --    Liver Function Tests:  Recent Labs Lab 10/16/13 0652 10/17/13 0635 10/19/13 0825 10/20/13 0859  AST 57* 48* 46* 40*  ALT 42 41 43 34  ALKPHOS 192* 176* 181* 149*  BILITOT 1.3* 1.1 1.0 0.9  PROT 6.6 6.7 7.6 6.8  ALBUMIN 1.9* 1.9* 2.3* 2.1*    Recent Labs Lab 10/16/13 0652 10/19/13 0825 10/20/13 0859  LIPASE 248* 614* 175*   CBC:  Recent Labs Lab 10/17/13 0635 10/19/13 0825  WBC 12.9* 13.4*  HGB 7.8* 8.5*  HCT 24.9* 27.4*  MCV 99.6 100.7*  PLT 206 285      SignedKaren Kitchens [phone]  [facility] 10/22/2013, 7:18 PM

## 2013-10-18 MED ORDER — DIGOXIN 0.25 MG/ML IJ SOLN
0.2500 mg | Freq: Once | INTRAMUSCULAR | Status: DC
  Filled 2013-10-18: qty 1

## 2013-10-18 MED ORDER — LORAZEPAM 1 MG PO TABS
1.0000 mg | ORAL_TABLET | Freq: Once | ORAL | Status: AC
  Administered 2013-10-19: 1 mg via ORAL
  Filled 2013-10-18: qty 1

## 2013-10-18 MED ORDER — LORAZEPAM 1 MG PO TABS: 1.0000 mg | ORAL_TABLET | Freq: Four times a day (QID) | ORAL | Status: DC | PRN

## 2013-10-18 NOTE — Clinical Social Work Psych Note (Addendum)
1:14pm- Psych CSW spoke to pt regarding his probation.  His probation officer is Kirtland Bouchard and can be reached at [phone].  Pt has given Psych CSW and medical staff verbal permission to release any/all information to [place].  Pt was given 18 months probation for assault with a deadly weapon of which was reduced to a misdemeanor- simple assault.  Pt has served approximately 6 months having only 12 months left to serve.  Pt was also ordered to Domestic Violence (DVIMP) classes.  Pt attends these classes every Thursday at 6:30pm.  Pt states that he was also order to restitution: $942 to assist with paying his probation officer, his DVIMP classes and the court fine.     1:04pm- Psych CSW discussed pt disposition with Psychiatrist.  Psych CSW sent referral to [facility]. Ph: [phone] f: [phone].  Psych CSW received confirmation of received fax and confirmed with Barbaraann Rondo at [facility] that pt is currently under review.  Nonnie Done, [place] [phone]  Clinical Social Work

## 2013-10-18 NOTE — Consult Note (Signed)
[facility] Face-to-Face Psychiatry Consult Follow up   Reason for Consult:  Alcohol intoxication, depression and s/p suicide attempt Referring Physician:  Dr. Ocie Cornfield is an 43 y.o. male. Total Time spent with patient: 45 minutes  Assessment: AXIS I:  Major Depression, Recurrent severe, Substance Induced Mood Disorder and Alcohol dependence AXIS II:  Deferred AXIS III:   Past Medical History  Diagnosis Date  . Hypertension   . Asthma   . Pancreatitis   . Cocaine abuse   . Depression   . H/O suicide attempt   . Heart murmur     "when he was little" (03/06/2013)  . Shortness of breath     "can happen at anytime" (03/06/2013)  . Anemia   . H/O hiatal hernia   . GERD (gastroesophageal reflux disease)   . Seizures     "weekly" (03/06/2013)  . Arthritis     "knees" (03/06/2013)  . Anxiety   . WPW (Wolff-Parkinson-White syndrome)     [name] 03/06/2013   AXIS IV:  economic problems, occupational problems, other psychosocial or environmental problems, problems related to social environment and problems with primary support group AXIS V:  41-50 serious symptoms  Plan:  Case discussed with Nonnie Done, LCSW Patient is waiting for the placement, referred to the [facility] and also to [facility] Continue Seroquel 25 mg BID and 100 mg Qhs for agitation and aggression Recommend psychiatric Inpatient admission when medically cleared. Supportive therapy provided about ongoing stressors. Continuous safety sitter and current medication management Appreciate psychiatric consultation Please contact [phone] if needs further assistance  Subjective:   [name] is a 43 y.o. male patient admitted with suicide attempt and alcohol intoxication.  HPI:  Patient was seen and chart reviewed. Psychiatric consultation was requested a suicide attempt while intoxicated. This is a 43 yo male with history of alcohol abuse versus dependence, elevated liver enzymes,  depression/anxiety comes in after intentionally ingesting some of his home meds to kill himself per intial report he told the emergency department on arrival. Patient was disheveled, poorly groomed and denies current symptoms of depression, anxiety and psychosis. Patient was placed in the 4 point restraints as he is being combative and removing catheters and intravenous tubes. He tells me he did this to try to get high off of the flexeril. He says he took 15 pills, combination of some of his flexeril and some of his clonidine. His girlfriend is pregnant and is planning on getting an abortion tomorrow, and they were argueing about that, and he got very upset and took a bunch of his pills. This occurred at about 10pm. He received charcoal in the emergency department. He also reports he has been drinking beer.   Interval history: The patient was seen today for psychiatric consultation follow up. Patient stated that his mother wanted him to go to placement for additional treatment especially for depression and to substance rehabilitation. Patient mother does not believe he is going to be sober without any assistance. Patient also stated he is having cravings for smoking but denies drinking. Patient has agree to go for further treatment in a hospital after brief discussion. He has status post suicidal attempt. Patient stated that in right frame of mind he does not want to kill him self. He has stressed about not abl to hold a job, relationship and history of legal charges and being on probation etc. Patient has been feeling much better and wants to go home with the  plan of residential substance abuse treatment program at [facility]. Patient mother does not believe he can stay sober due to previous history. He stated he understands about his situation with his girlfriend and does not want to continue to suffer with it.   Patient reportedly has conflict with his baby's mother who was pregnant at 4 months  gestation. Patient mother is living in [place] and came to here and spend a few months the lost 6 months to one year. Reportedly patient met her on Facebook. Patient stopped drinking 3 weeks ago. Patient stated that his baby his mother threatened about going for an abortion because she does not believe patient can support the pregnancy and the baby. Patient reported he feels depressed about not having the job and finances to support them. 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 Patient requesting treatment for depression, anxiety and also interested to participate alcohol rehabilitation treatment at [facility] if possible. Patient family especially mother and father and a sister visiting him while in the hospital. Patient has completed alcohol detox treatment with [facility]. His blood alcohol level in the emergency department his 298 on arrival. Patient had alcohol detox, rehabilitation treatment 2012 at [facility] which was now [facility].   HPI Elements:   Location:  Depression and alcohol abuse. Quality:  poor. Severity:  Acute. Timing:  Suicide attempt and intoxication.  Past Psychiatric History: Past Medical History  Diagnosis Date  . Hypertension   . Asthma   . Pancreatitis   . Cocaine abuse   . Depression   . H/O suicide attempt   . Heart murmur     "when he was little" (03/06/2013)  . Shortness of breath     "can happen at anytime" (03/06/2013)  . Anemia   . H/O hiatal hernia   . GERD (gastroesophageal reflux disease)   . Seizures     "weekly" (03/06/2013)  . Arthritis     "knees" (03/06/2013)  . Anxiety   . WPW (Wolff-Parkinson-White syndrome)     [name] 03/06/2013    reports that he has been smoking Cigarettes.  He has a 30 pack-year smoking history. He uses smokeless tobacco. He reports that he drinks about 25.2 ounces of alcohol per week. He reports that he uses illicit drugs (Cocaine and Marijuana). Family  History  Problem Relation Age of Onset  . Hypertension Other   . Coronary artery disease Other      Living Arrangements: Parent   Abuse/Neglect [facility]) Physical Abuse: Denies Verbal Abuse: Denies Sexual Abuse: Denies Allergies:   Allergies  Allergen Reactions  . Trazodone And Nefazodone Other (See Comments)    Muscle spasms  . Shellfish-Derived Products Nausea And Vomiting    ACT Assessment Complete:  No Objective: Blood pressure 98/61, pulse 82, temperature 97.8 F (36.6 C), temperature source Oral, resp. rate 18, height $RemoveBe'5\' 9"'AnxMhxSJJ$  (1.753 m), weight 59.2 kg (130 lb 8.2 oz), SpO2 99.00%.Body mass index is 19.26 kg/(m^2). Results for orders placed during the hospital encounter of 10/05/13 (from the past 72 hour(s))  MAGNESIUM     Status: None   Collection Time    10/16/13  6:52 AM      Result Value Ref Range   Magnesium 1.8  1.5 - 2.5 mg/dL  PHOSPHORUS     Status: Abnormal   Collection Time    10/16/13  6:52 AM      Result Value Ref Range   Phosphorus  4.7 (*) 2.3 - 4.6 mg/dL  BASIC METABOLIC PANEL     Status: Abnormal   Collection Time    10/16/13  6:52 AM      Result Value Ref Range   Sodium 141  137 - 147 mEq/L   Potassium 4.4  3.7 - 5.3 mEq/L   Chloride 106  96 - 112 mEq/L   CO2 22  19 - 32 mEq/L   Glucose, Bld 86  70 - 99 mg/dL   BUN 3 (*) 6 - 23 mg/dL   Creatinine, Ser 0.61  0.50 - 1.35 mg/dL   Calcium 8.8  8.4 - 10.5 mg/dL   GFR calc non Af Amer >90  >90 mL/min   GFR calc Af Amer >90  >90 mL/min   Comment: (NOTE)     The eGFR has been calculated using the CKD EPI equation.     This calculation has not been validated in all clinical situations.     eGFR's persistently <90 mL/min signify possible Chronic Kidney     Disease.  LIPASE, BLOOD     Status: Abnormal   Collection Time    10/16/13  6:52 AM      Result Value Ref Range   Lipase 248 (*) 11 - 59 U/L  HEPATIC FUNCTION PANEL     Status: Abnormal   Collection Time    10/16/13  6:52 AM      Result Value Ref  Range   Total Protein 6.6  6.0 - 8.3 g/dL   Albumin 1.9 (*) 3.5 - 5.2 g/dL   AST 57 (*) 0 - 37 U/L   ALT 42  0 - 53 U/L   Alkaline Phosphatase 192 (*) 39 - 117 U/L   Total Bilirubin 1.3 (*) 0.3 - 1.2 mg/dL   Bilirubin, Direct 0.6 (*) 0.0 - 0.3 mg/dL   Indirect Bilirubin 0.7  0.3 - 0.9 mg/dL  CBC     Status: Abnormal   Collection Time    10/17/13  6:35 AM      Result Value Ref Range   WBC 12.9 (*) 4.0 - 10.5 K/uL   RBC 2.50 (*) 4.22 - 5.81 MIL/uL   Hemoglobin 7.8 (*) 13.0 - 17.0 g/dL   HCT 24.9 (*) 39.0 - 52.0 %   MCV 99.6  78.0 - 100.0 fL   MCH 31.2  26.0 - 34.0 pg   MCHC 31.3  30.0 - 36.0 g/dL   RDW 19.4 (*) 11.5 - 15.5 %   Platelets 206  150 - 400 K/uL  COMPREHENSIVE METABOLIC PANEL     Status: Abnormal   Collection Time    10/17/13  6:35 AM      Result Value Ref Range   Sodium 140  137 - 147 mEq/L   Potassium 4.2  3.7 - 5.3 mEq/L   Chloride 105  96 - 112 mEq/L   CO2 23  19 - 32 mEq/L   Glucose, Bld 109 (*) 70 - 99 mg/dL   BUN 4 (*) 6 - 23 mg/dL   Creatinine, Ser 0.64  0.50 - 1.35 mg/dL   Calcium 8.7  8.4 - 10.5 mg/dL   Total Protein 6.7  6.0 - 8.3 g/dL   Albumin 1.9 (*) 3.5 - 5.2 g/dL   AST 48 (*) 0 - 37 U/L   ALT 41  0 - 53 U/L   Alkaline Phosphatase 176 (*) 39 - 117 U/L   Total Bilirubin 1.1  0.3 - 1.2 mg/dL   GFR calc  non Af Amer >90  >90 mL/min   GFR calc Af Amer >90  >90 mL/min   Comment: (NOTE)     The eGFR has been calculated using the CKD EPI equation.     This calculation has not been validated in all clinical situations.     eGFR's persistently <90 mL/min signify possible Chronic Kidney     Disease.  URINALYSIS, ROUTINE W REFLEX MICROSCOPIC     Status: None   Collection Time    10/17/13  8:06 AM      Result Value Ref Range   Color, Urine YELLOW  YELLOW   APPearance CLEAR  CLEAR   Specific Gravity, Urine 1.017  1.005 - 1.030   pH 5.5  5.0 - 8.0   Glucose, UA NEGATIVE  NEGATIVE mg/dL   Hgb urine dipstick NEGATIVE  NEGATIVE   Bilirubin Urine NEGATIVE   NEGATIVE   Ketones, ur NEGATIVE  NEGATIVE mg/dL   Protein, ur NEGATIVE  NEGATIVE mg/dL   Urobilinogen, UA 0.2  0.0 - 1.0 mg/dL   Nitrite NEGATIVE  NEGATIVE   Leukocytes, UA NEGATIVE  NEGATIVE   Comment: MICROSCOPIC NOT DONE ON URINES WITH NEGATIVE PROTEIN, BLOOD, LEUKOCYTES, NITRITE, OR GLUCOSE <1000 mg/dL.   Labs are reviewed and are pertinent for alcohol intoxication.  Current Facility-Administered Medications  Medication Dose Route Frequency Provider Last Rate Last Dose  . 0.9 %  sodium chloride infusion  250 mL Intravenous Continuous [name], MD 100 mL/hr at 10/16/13 1501 250 mL at 10/16/13 1501  . acetaminophen (TYLENOL) solution 650 mg  650 mg Oral Q6H PRN Juanito Doom, MD   650 mg at 10/17/13 1705  . albuterol (PROVENTIL) (2.5 MG/3ML) 0.083% nebulizer solution 2.5 mg  2.5 mg Inhalation Q2H PRN Chesley Mires, MD      . alum & mag hydroxide-simeth (MAALOX/MYLANTA) 200-200-20 MG/5ML suspension 30 mL  30 mL Oral Q4H PRN Melton Alar, PA-C   30 mL at 10/17/13 1705  . benzonatate (TESSALON) capsule 100 mg  100 mg Oral TID PRN [name], MD   100 mg at 10/17/13 1542  . bismuth subsalicylate (PEPTO BISMOL) 262 MG/15ML suspension 30 mL  30 mL Oral Q4H PRN Juanito Doom, MD      . cyclobenzaprine (FLEXERIL) tablet 5 mg  5 mg Oral Daily [name], MD   5 mg at 10/18/13 0930  . enoxaparin (LOVENOX) injection 40 mg  40 mg Subcutaneous Q24H Jonetta Osgood, MD   40 mg at 10/16/13 1456  . folic acid (FOLVITE) tablet 1 mg  1 mg Oral Daily Darnell Level Mancheril, RPH   1 mg at 10/18/13 0930  . LORazepam (ATIVAN) injection 2 mg  2 mg Intramuscular Once Gardiner Barefoot, NP      . LORazepam (ATIVAN) tablet 1 mg  1 mg Oral Q6H PRN Radene Gunning, NP   1 mg at 10/18/13 0936  . menthol-cetylpyridinium (CEPACOL) lozenge 3 mg  1 lozenge Oral PRN [name], MD   3 mg at 10/17/13 2128  . metoprolol (LOPRESSOR) injection 2.5 mg  2.5 mg Intravenous Q5 min PRN Melton Alar, PA-C      . metoprolol (LOPRESSOR) tablet 50 mg  50 mg Oral BID Melton Alar, PA-C   50 mg at 10/18/13 0930  . multivitamin with minerals tablet 1 tablet  1 tablet Oral Daily Raylene Miyamoto, MD   1 tablet at 10/18/13 0930  . pantoprazole (PROTONIX) EC  tablet 40 mg  40 mg Oral QHS Darnell Level Mancheril, RPH   40 mg at 10/17/13 2127  . QUEtiapine (SEROQUEL) tablet 100 mg  100 mg Oral QHS Durward Parcel, MD   100 mg at 10/17/13 2127  . QUEtiapine (SEROQUEL) tablet 25 mg  25 mg Oral BID AC [name], MD   25 mg at 10/18/13 0735  . sodium chloride 0.9 % injection 3 mL  3 mL Intravenous Q12H Marianne L York, PA-C   3 mL at 10/17/13 1017  . sodium chloride 0.9 % injection 3 mL  3 mL Intravenous PRN Melton Alar, PA-C      . thiamine (VITAMIN B-1) tablet 100 mg  100 mg Oral Daily Darnell Level Mancheril, RPH   100 mg at 10/18/13 0930    Psychiatric Specialty Exam: Physical Exam  ROS  Blood pressure 98/61, pulse 82, temperature 97.8 F (36.6 C), temperature source Oral, resp. rate 18, height $RemoveBe'5\' 9"'fRTnAhZCO$  (1.753 m), weight 59.2 kg (130 lb 8.2 oz), SpO2 99.00%.Body mass index is 19.26 kg/(m^2).  General Appearance: Disheveled and Guarded  Engineer, water::  Fair  Speech:  Clear and Coherent and Slow  Volume:  Decreased  Mood:  Anxious and Depressed  Affect:  Depressed and Tearful  Thought Process:  Coherent and Goal Directed  Orientation:  Full (Time, Place, and Person)  Thought Content:  Rumination  Suicidal Thoughts:  Yes.  with intent/plan  Homicidal Thoughts:  No  Memory:  Immediate;   Fair Recent;   Fair  Judgement:  Intact  Insight:  Fair  Psychomotor Activity:  Decreased  Concentration:  Fair  Recall:  [employer] of Knowledge:Fair  Language: Fair  Akathisia:  NA  Handed:  Right  AIMS (if indicated):     Assets:  Communication Skills Desire for Improvement Housing Intimacy Leisure Time Resilience Social Support  Sleep:      Musculoskeletal: Strength &  Muscle Tone: within normal limits Gait & Station: unable to stand Patient leans: N/A  Treatment Plan Summary: Daily contact with patient to assess and evaluate symptoms and progress in treatment Medication management  Isidra Mings,JANARDHAHA R. 10/18/2013 1:01 PM

## 2013-10-18 NOTE — Progress Notes (Signed)
PROGRESS NOTE  [name] XAJ:[id number] DOB: [birth year] DOA: 10/05/2013 PCP: Angelica Chessman, MD   Patient medically discharged.  Waiting for transfer to [facility] when bed is available.    Assessment/Plan:  Suicide attempt  -intentional overdose of flexeril, clonidine.  -Psychiatry was consulted and followed the patient daily making appropriate medication changes.  -Sitter remained at bedside. Suicide precautions were implemented  -The patient was involuntarily committed and will be transferred to behavioral health at discharge  Active Problems:   Sinus tachycardia versus / SVT  -The patient experienced rapid heartbeat when standing and walking.  -Uncertain etiology. Reportedly has a history of Wolff-Parkinson-White.  - He required IV lopressor when necessary. Metoprolol was started and then increased to 50 mg bid 6/10.  - On 6/11 his heart rate remained stable in the low 110. Telemetry was discontinued.  - Per the chart the patient has a history of sinus tach over the past 6 months.  - Cardiology was consulted they had no further recommendations other than IV hydration and beta blockers.  - Recommend outpatient followup as he has been started on beta blockers.   Acute on chronic Pancreatitis  -The patient complained of abdominal pain his lipase was mildly elevated  -belly soft-mildly tender to palpation in epigastric area  - He was treated with supportive care and clear liquid diet.  -We were able to slowly advance his diet. On June 10th he was able to tolerate a low-fat diet with no difficulty.   Acute encephalopathy secondary to alcohol abuse  -2/2 to ETOH withdrawal.  -Became so acute he required transfer to the critical care service  -Symptoms resolved with [facility]  -He has been treated with thiamine, folate, and a multivitamin   Protein-calorie malnutrition, severe  -Protein supplements were recommended by nutrition and added to his diet   Mild leukocytosis    -WBC was 12+ on June 10 and 11th.  -Chest x-ray negative  -Urinalysis negative for infection  -Patient is afebrile and asymptomatic.   DVT Prophylaxis:  lovenox  Code Status: full Family Communication:  Disposition Plan: to [facility]   Consultations:  Psychiatry  Cardiology   Procedures:    Antibiotics: Anti-infectives   None      HPI/Subjective: No complaints.  Objective: Filed Vitals:   10/17/13 0540 10/17/13 1330 10/17/13 2042 10/18/13 0601  BP: 114/79 118/77 113/74 98/61  Pulse: 98 89 85 82  Temp: 98.6 F (37 C) 99 F (37.2 C) 98.7 F (37.1 C) 97.8 F (36.6 C)  TempSrc: Oral Oral Oral   Resp: 17 18 15 18   Height:      Weight:      SpO2: 99% 100% 100% 99%    Intake/Output Summary (Last 24 hours) at 10/18/13 0949 Last data filed at 10/17/13 2004  Gross per 24 hour  Intake    222 ml  Output      0 ml  Net    222 ml   Filed Weights   10/05/13 2302 10/06/13 0002 10/06/13 0345  Weight: 66.679 kg (147 lb) 79.379 kg (175 lb) 59.2 kg (130 lb 8.2 oz)    Exam: General: Thin, Well developed, NAD, appears older than stated age.  Sociable and pleasant today. HEENT:  PERR, EOMI, Anicteic Sclera, MMM. No pharyngeal erythema or exudates  Neck: Supple, no JVD, no masses  Cardiovascular: RRR, S1 S2 auscultated, no rubs, murmurs or gallops.   Respiratory: Clear to auscultation bilaterally with equal chest rise  Abdomen: Soft, nontender, nondistended, +  bowel sounds  Extremities: warm dry without cyanosis clubbing or edema.  Neuro: AAOx3, cranial nerves grossly intact. Strength 5/5 in upper and lower extremities  Skin: Without rashes exudates or nodules.    Data Reviewed: Basic Metabolic Panel:  Recent Labs Lab 10/12/13 0324 10/13/13 0259 10/14/13 0220 10/14/13 1010 10/16/13 0652 10/17/13 0635  NA 140 140  --  138 141 140  K 4.3 4.1  --  3.9 4.4 4.2  CL 107 106  --  104 106 105  CO2 21 22  --  21 22 23   GLUCOSE 90 88  --  94 86 109*  BUN <3* <3*  --   3* 3* 4*  CREATININE 0.52 0.53  --  0.63 0.61 0.64  CALCIUM 8.4 8.6  --  8.2* 8.8 8.7  MG 1.6 1.7 1.4*  --  1.8  --   PHOS 3.9 4.1 3.6  --  4.7*  --    Liver Function Tests:  Recent Labs Lab 10/12/13 0324 10/14/13 1010 10/16/13 0652 10/17/13 0635  AST 62* 50* 57* 48*  ALT 50 41 42 41  ALKPHOS 233* 215* 192* 176*  BILITOT 2.4* 1.4* 1.3* 1.1  PROT 6.6 6.5 6.6 6.7  ALBUMIN 1.6* 1.7* 1.9* 1.9*    Recent Labs Lab 10/14/13 1010 10/16/13 0652  LIPASE 239* 248*   CBC:  Recent Labs Lab 10/12/13 0324 10/13/13 0259 10/14/13 1010 10/17/13 0635  WBC 8.6 9.3 12.9* 12.9*  HGB 10.9* 10.4* 8.5* 7.8*  HCT 33.8* 31.4* 26.0* 24.9*  MCV 100.9* 97.8 97.4 99.6  PLT 73* 105* 123* 206     Recent Results (from the past 240 hour(s))  MRSA PCR SCREENING     Status: None   Collection Time    10/09/13  1:00 AM      Result Value Ref Range Status   MRSA by PCR NEGATIVE  NEGATIVE Final   Comment:            The GeneXpert MRSA Assay (FDA     approved for NASAL specimens     only), is one component of a     comprehensive MRSA colonization     surveillance program. It is not     intended to diagnose MRSA     infection nor to guide or     monitor treatment for     MRSA infections.     Studies: Dg Chest 2 View  10/17/2013   CLINICAL DATA:  Cough  EXAM: CHEST  2 VIEW  COMPARISON:  10/12/2013  FINDINGS: Cardiac silhouette is normal in size. Normal mediastinal and hilar contours.  Lungs are clear. Previously seen interstitial airspace opacities have resolved.  No pleural effusion or pneumothorax.  Bony thorax is intact.  IMPRESSION: No active cardiopulmonary disease.   Electronically Signed   By: Lajean Manes M.D.   On: 10/17/2013 08:24    Scheduled Meds: . cyclobenzaprine  5 mg Oral Daily  . enoxaparin (LOVENOX) injection  40 mg Subcutaneous Q24H  . folic acid  1 mg Oral Daily  . LORazepam  2 mg Intramuscular Once  . metoprolol tartrate  50 mg Oral BID  . multivitamin with minerals   1 tablet Oral Daily  . pantoprazole  40 mg Oral QHS  . QUEtiapine  100 mg Oral QHS  . QUEtiapine  25 mg Oral BID AC  . sodium chloride  3 mL Intravenous Q12H  . thiamine  100 mg Oral Daily   Continuous Infusions: . sodium chloride 250  mL (10/16/13 1501)    Principal Problem:   Suicide attempt Active Problems:   ALCOHOL ABUSE   SUBSTANCE ABUSE, MULTIPLE   LIVER FUNCTION TESTS, ABNORMAL, HX OF   [name] Parkinson White pattern seen on electrocardiogram   Encephalopathy   Overdose   Protein-calorie malnutrition, severe    Karen Kitchens  [facility] Pager [phone]. If 7PM-7AM, please contact night-coverage at [email or web address], password [facility] 10/18/2013, 9:49 AM  LOS: 13 days

## 2013-10-18 NOTE — Progress Notes (Signed)
  I have directly reviewed the clinical findings, lab, imaging studies and management of this patient in detail. I have interviewed and examined the patient and agree with the documentation,  as recorded by the Physician extender.  [name] M.D on 10/18/2013 at 11:08 AM  [facility] Office  [phone]

## 2013-10-19 ENCOUNTER — Inpatient Hospital Stay (HOSPITAL_COMMUNITY): Payer: Self-pay

## 2013-10-19 LAB — COMPREHENSIVE METABOLIC PANEL
ALT: 43 U/L (ref 0–53)
AST: 46 U/L — AB (ref 0–37)
Albumin: 2.3 g/dL — ABNORMAL LOW (ref 3.5–5.2)
Alkaline Phosphatase: 181 U/L — ABNORMAL HIGH (ref 39–117)
BUN: 4 mg/dL — ABNORMAL LOW (ref 6–23)
CALCIUM: 9.1 mg/dL (ref 8.4–10.5)
CO2: 26 meq/L (ref 19–32)
CREATININE: 0.56 mg/dL (ref 0.50–1.35)
Chloride: 98 mEq/L (ref 96–112)
GFR calc Af Amer: >90 mL/min (ref >90)
Glucose, Bld: 91 mg/dL (ref 70–99)
Potassium: 4.5 mEq/L (ref 3.7–5.3)
Sodium: 136 mEq/L — ABNORMAL LOW (ref 137–147)
TOTAL PROTEIN: 7.6 g/dL (ref 6.0–8.3)
Total Bilirubin: 1 mg/dL (ref 0.3–1.2)

## 2013-10-19 LAB — CBC
HCT: 27.4 % — ABNORMAL LOW (ref 39.0–52.0)
HEMOGLOBIN: 8.5 g/dL — AB (ref 13.0–17.0)
MCH: 31.3 pg (ref 26.0–34.0)
MCHC: 31 g/dL (ref 30.0–36.0)
MCV: 100.7 fL — AB (ref 78.0–100.0)
PLATELETS: 285 10*3/uL (ref 150–400)
RBC: 2.72 MIL/uL — AB (ref 4.22–5.81)
RDW: 19.1 % — ABNORMAL HIGH (ref 11.5–15.5)
WBC: 13.4 10*3/uL — AB (ref 4.0–10.5)

## 2013-10-19 LAB — LIPID PANEL
CHOL/HDL RATIO: 3.1 ratio
Cholesterol: 164 mg/dL (ref 0–200)
HDL: 53 mg/dL (ref >39)
LDL Cholesterol: 89 mg/dL (ref 0–99)
Triglycerides: 108 mg/dL (ref <150)
VLDL: 22 mg/dL (ref 0–40)

## 2013-10-19 LAB — LIPASE, BLOOD: Lipase: 614 U/L — ABNORMAL HIGH (ref 11–59)

## 2013-10-19 MED ORDER — SODIUM CHLORIDE 0.9 % IV SOLN
INTRAVENOUS | Status: AC
  Administered 2013-10-19: 16:00:00 via INTRAVENOUS

## 2013-10-19 MED ORDER — OXYCODONE HCL 5 MG PO TABS
5.0000 mg | ORAL_TABLET | Freq: Four times a day (QID) | ORAL | Status: DC | PRN
  Administered 2013-10-19 – 2013-10-22 (×12): 5 mg via ORAL
  Filled 2013-10-19 (×12): qty 1

## 2013-10-19 NOTE — Progress Notes (Signed)
Weekend CSW followed up on referrals made by Weekday Psychiatric CSW to [facility] and [facility]. Per staff at [facility], patient remains on waiting list and is not expected to be accepted over the weekend. Per staff at [facility], patient's referral is under review until Monday 10/21/13- when staff will make final determination of acceptance/denial. Weekend CSW will continue to follow psychiatric recommendation of placement at [facility] or [facility] and assist with support/placement as needed.  Tilden Fossa, MSW, [place] Clinical Social Worker [facility]. [phone]

## 2013-10-19 NOTE — Progress Notes (Signed)
Notified by sitter that pt has been eating cookies several times this am after being told by 2 nurses to not eat/drink. Also, refuses to have IVF started.

## 2013-10-19 NOTE — Progress Notes (Addendum)
PROGRESS NOTE  [name] YQM:[id number] DOB: 1970/12/06 DOA: 10/05/2013 PCP: Angelica Chessman, MD   Patient medically discharged.  Waiting for transfer to [facility] when bed is available.    Assessment/Plan:  Suicide attempt  -intentional overdose of flexeril, clonidine.  -Psychiatry was consulted and followed the patient daily making appropriate medication changes.  -Sitter remained at bedside. Suicide precautions were implemented  -The patient was involuntarily committed and will be transferred to behavioral health at discharge      Sinus tachycardia versus -Chronic sinus tachycardia, going back in his chart has been present for at least 6 months, seen by electrophysiologist Dr.Croitoru, recommended supportive care only. Beta blocker for hypertension to be continued. He is symptom free.    Acute on chronic Pancreatitis  -The past attribute it to alcohol abuse, he had improved but this morning complains of epigastric pain radiating to the back, he has been non complaint ate a burger yesterday and eating cookies non stop despite counseling. Will repeat CBC CMP and lipase, will check a right upper quadrant ultrasound to evaluate CBD and pancreas as well, will check a lipid panel also.Now refusing NPO and IVF.       Acute encephalopathy secondary to alcohol abuse  -Due to DTs, initially required placement in ICU. Now resolved. Continue folic acid and thiamine supplementation. As needed Ativan      Protein-calorie malnutrition, severe  -Protein supplements were recommended by nutrition and added to his diet     Mild leukocytosis  -Nonspecific, chest x-ray UA unremarkable, could be secondary to mild pancreatitis. We'll monitor closely.      DVT Prophylaxis:  lovenox  Code Status: full Family Communication:  Disposition Plan: to [facility] once abdominal pain improved.   Consultations:  Psychiatry  Cardiology   Procedures:    Antibiotics: Anti-infectives     None      HPI/Subjective: No complaints.  Objective: Filed Vitals:   10/18/13 1407 10/18/13 2036 10/18/13 2146 10/19/13 0549  BP: 120/76 115/73 121/80 92/70  Pulse: 91 86 89 92  Temp: 98.4 F (36.9 C) 98.3 F (36.8 C)  97.9 F (36.6 C)  TempSrc: Oral Oral  Oral  Resp: 18 15 18 18   Height:      Weight:      SpO2: 100% 99% 100% 100%    Intake/Output Summary (Last 24 hours) at 10/19/13 0852 Last data filed at 10/18/13 1408  Gross per 24 hour  Intake    360 ml  Output      0 ml  Net    360 ml   Filed Weights   10/05/13 2302 10/06/13 0002 10/06/13 0345  Weight: 66.679 kg (147 lb) 79.379 kg (175 lb) 59.2 kg (130 lb 8.2 oz)    Exam: General: Thin, Well developed, NAD, appears older than stated age.  Sociable and pleasant today. HEENT:  PERR, EOMI, Anicteic Sclera, MMM. No pharyngeal erythema or exudates  Neck: Supple, no JVD, no masses  Cardiovascular: RRR, S1 S2 auscultated, no rubs, murmurs or gallops.   Respiratory: Clear to auscultation bilaterally with equal chest rise  Abdomen: Soft, nontender, nondistended, + bowel sounds  Extremities: warm dry without cyanosis clubbing or edema.  Neuro: AAOx3, cranial nerves grossly intact. Strength 5/5 in upper and lower extremities  Skin: Without rashes exudates or nodules.    Data Reviewed: Basic Metabolic Panel:  Recent Labs Lab 10/13/13 0259 10/14/13 0220 10/14/13 1010 10/16/13 0652 10/17/13 0635  NA 140  --  138 141 140  K  4.1  --  3.9 4.4 4.2  CL 106  --  104 106 105  CO2 22  --  21 22 23   GLUCOSE 88  --  94 86 109*  BUN <3*  --  3* 3* 4*  CREATININE 0.53  --  0.63 0.61 0.64  CALCIUM 8.6  --  8.2* 8.8 8.7  MG 1.7 1.4*  --  1.8  --   PHOS 4.1 3.6  --  4.7*  --    Liver Function Tests:  Recent Labs Lab 10/14/13 1010 10/16/13 0652 10/17/13 0635  AST 50* 57* 48*  ALT 41 42 41  ALKPHOS 215* 192* 176*  BILITOT 1.4* 1.3* 1.1  PROT 6.5 6.6 6.7  ALBUMIN 1.7* 1.9* 1.9*    Recent Labs Lab  10/14/13 1010 10/16/13 0652  LIPASE 239* 248*   CBC:  Recent Labs Lab 10/13/13 0259 10/14/13 1010 10/17/13 0635 10/19/13 0825  WBC 9.3 12.9* 12.9* 13.4*  HGB 10.4* 8.5* 7.8* 8.5*  HCT 31.4* 26.0* 24.9* 27.4*  MCV 97.8 97.4 99.6 100.7*  PLT 105* 123* 206 285     No results found for this or any previous visit (from the past 240 hour(s)).   Studies: No results found.  Scheduled Meds: . cyclobenzaprine  5 mg Oral Daily  . enoxaparin (LOVENOX) injection  40 mg Subcutaneous Q24H  . folic acid  1 mg Oral Daily  . LORazepam  2 mg Intramuscular Once  . metoprolol tartrate  50 mg Oral BID  . multivitamin with minerals  1 tablet Oral Daily  . pantoprazole  40 mg Oral QHS  . QUEtiapine  100 mg Oral QHS  . QUEtiapine  25 mg Oral BID AC  . sodium chloride  3 mL Intravenous Q12H  . thiamine  100 mg Oral Daily   Continuous Infusions:    Principal Problem:   Suicide attempt Active Problems:   ALCOHOL ABUSE   SUBSTANCE ABUSE, MULTIPLE   LIVER FUNCTION TESTS, ABNORMAL, HX OF   [name] Parkinson White pattern seen on electrocardiogram   Encephalopathy   Overdose   Protein-calorie malnutrition, severe    Lala Lund K M.D on 10/19/2013 at 8:56 AM  Between 7am to 7pm - Pager - [phone], After 7pm go to [email or web address] - password [email or web address]  And look for the night coverage person covering me after hours  [name]  [phone]   10/19/2013, 8:52 AM  LOS: 14 days

## 2013-10-20 DIAGNOSIS — F332 Major depressive disorder, recurrent severe without psychotic features: Secondary | ICD-10-CM

## 2013-10-20 DIAGNOSIS — F1020 Alcohol dependence, uncomplicated: Secondary | ICD-10-CM

## 2013-10-20 LAB — COMPREHENSIVE METABOLIC PANEL
ALT: 34 U/L (ref 0–53)
AST: 40 U/L — ABNORMAL HIGH (ref 0–37)
Albumin: 2.1 g/dL — ABNORMAL LOW (ref 3.5–5.2)
Alkaline Phosphatase: 149 U/L — ABNORMAL HIGH (ref 39–117)
BUN: 5 mg/dL — ABNORMAL LOW (ref 6–23)
CALCIUM: 8.6 mg/dL (ref 8.4–10.5)
CO2: 25 mEq/L (ref 19–32)
Chloride: 102 mEq/L (ref 96–112)
Creatinine, Ser: 0.62 mg/dL (ref 0.50–1.35)
GFR calc non Af Amer: >90 mL/min (ref >90)
GLUCOSE: 70 mg/dL (ref 70–99)
Potassium: 4.4 mEq/L (ref 3.7–5.3)
Sodium: 139 mEq/L (ref 137–147)
Total Bilirubin: 0.9 mg/dL (ref 0.3–1.2)
Total Protein: 6.8 g/dL (ref 6.0–8.3)

## 2013-10-20 LAB — LIPASE, BLOOD: Lipase: 175 U/L — ABNORMAL HIGH (ref 11–59)

## 2013-10-20 NOTE — Progress Notes (Signed)
PATIENT DETAILS Name: [name] Age: 43 y.o. Sex: male Date of Birth: 1970/07/12 Admit Date: 10/05/2013 Admitting Physician Phillips Grout, MD EUM:[id number], Gabrielle Dare, MD  Subjective: Abd pain better, wants to eat  Assessment/Plan: Suicide attempt  -intentional overdose of flexeril, clonidine.  -Psychiatry was consulted and followed the patient daily making appropriate medication changes.  -Sitter remained at bedside. Suicide precautions were implemented  -The patient was involuntarily committed and will be transferred to behavioral health at discharge   Sinus tachycardia  -Chronic sinus tachycardia, going back in his chart has been present for at least 6 months, seen by electrophysiologist Dr.Croitoru, recommended supportive care only. Beta blocker for hypertension to be continued. He is symptom free.  Acute on chronic Pancreatitis  -The past attribute it to alcohol abuse, he had improved but this morning complains of epigastric pain radiating to the back, he has been non complaint ate a burger 6/12-worsening the pain.Pain better on evaluation on 6/14, start clear liquids this pm, continue to provide supportive care.   Acute encephalopathy secondary to alcohol abuse  -Due to DTs, initially required placement in ICU. Now resolved. Continue folic acid and thiamine supplementation. As needed Ativan   Protein-calorie malnutrition, severe  -Protein supplements were recommended by nutrition and added to his diet   Mild leukocytosis  -Nonspecific, chest x-ray UA unremarkable, could be secondary to mild pancreatitis. We'll monitor closely.   Disposition: Remain inpatient  DVT Prophylaxis: Prophylactic Lovenox   Code Status: Full code   Family Communication None  Procedures:  None  CONSULTS:  psychiatry  MEDICATIONS: Scheduled Meds: . cyclobenzaprine  5 mg Oral Daily  . enoxaparin (LOVENOX) injection  40 mg Subcutaneous Q24H  . folic acid  1 mg Oral Daily   . LORazepam  2 mg Intramuscular Once  . metoprolol tartrate  50 mg Oral BID  . multivitamin with minerals  1 tablet Oral Daily  . pantoprazole  40 mg Oral QHS  . QUEtiapine  100 mg Oral QHS  . QUEtiapine  25 mg Oral BID AC  . sodium chloride  3 mL Intravenous Q12H  . thiamine  100 mg Oral Daily   Continuous Infusions:  PRN Meds:.acetaminophen (TYLENOL) oral liquid 160 mg/5 mL, albuterol, alum & mag hydroxide-simeth, benzonatate, bismuth subsalicylate, LORazepam, menthol-cetylpyridinium, metoprolol, oxyCODONE, sodium chloride  Antibiotics: Anti-infectives   None       PHYSICAL EXAM: Vital signs in last 24 hours: Filed Vitals:   10/19/13 0549 10/19/13 1346 10/19/13 2151 10/20/13 0530  BP: 92/70 109/73 111/68 103/70  Pulse: 92 91 92 76  Temp: 97.9 F (36.6 C) 98.2 F (36.8 C) 98.1 F (36.7 C) 97.9 F (36.6 C)  TempSrc: Oral Oral Oral Oral  Resp: 18 16 16 16   Height:      Weight:      SpO2: 100% 100% 100% 97%    Weight change:  Filed Weights   10/05/13 2302 10/06/13 0002 10/06/13 0345  Weight: 66.679 kg (147 lb) 79.379 kg (175 lb) 59.2 kg (130 lb 8.2 oz)   Body mass index is 19.26 kg/(m^2).   Gen Exam: Awake and alert with clear speech.   Neck: Supple, No JVD.   Chest: B/L Clear.   CVS: S1 S2 Regular, no murmurs.  Abdomen: soft, BS +, mildly tender in epigastric, non distended.  Extremities: no edema, lower extremities warm to touch. Neurologic: Non Focal.   Skin: No Rash.   Wounds: N/A.   Intake/Output from previous  day:  Intake/Output Summary (Last 24 hours) at 10/20/13 1144 Last data filed at 10/20/13 1030  Gross per 24 hour  Intake 1222.5 ml  Output      0 ml  Net 1222.5 ml     LAB RESULTS: CBC  Recent Labs Lab 10/14/13 1010 10/17/13 0635 10/19/13 0825  WBC 12.9* 12.9* 13.4*  HGB 8.5* 7.8* 8.5*  HCT 26.0* 24.9* 27.4*  PLT 123* 206 285  MCV 97.4 99.6 100.7*  MCH 31.8 31.2 31.3  MCHC 32.7 31.3 31.0  RDW 20.1* 19.4* 19.1*     Chemistries   Recent Labs Lab 10/14/13 0220 10/14/13 1010 10/16/13 0652 10/17/13 0635 10/19/13 0825 10/20/13 0859  NA  --  138 141 140 136* 139  K  --  3.9 4.4 4.2 4.5 4.4  CL  --  104 106 105 98 102  CO2  --  21 22 23 26 25   GLUCOSE  --  94 86 109* 91 70  BUN  --  3* 3* 4* 4* 5*  CREATININE  --  0.63 0.61 0.64 0.56 0.62  CALCIUM  --  8.2* 8.8 8.7 9.1 8.6  MG 1.4*  --  1.8  --   --   --     CBG: No results found for this basename: GLUCAP,  in the last 168 hours  GFR Estimated Creatinine Clearance: 98.7 ml/min (by C-G formula based on Cr of 0.62).  Coagulation profile No results found for this basename: INR, PROTIME,  in the last 168 hours  Cardiac Enzymes No results found for this basename: CK, CKMB, TROPONINI, MYOGLOBIN,  in the last 168 hours  No components found with this basename: POCBNP,  No results found for this basename: DDIMER,  in the last 72 hours No results found for this basename: HGBA1C,  in the last 72 hours  Recent Labs  10/19/13 0825  CHOL 164  HDL 53  LDLCALC 89  TRIG 108  CHOLHDL 3.1   No results found for this basename: TSH, T4TOTAL, FREET3, T3FREE, THYROIDAB,  in the last 72 hours No results found for this basename: VITAMINB12, FOLATE, FERRITIN, TIBC, IRON, RETICCTPCT,  in the last 72 hours  Recent Labs  10/19/13 0825 10/20/13 0859  LIPASE 614* 175*    Urine Studies No results found for this basename: UACOL, UAPR, USPG, UPH, UTP, UGL, UKET, UBIL, UHGB, UNIT, UROB, ULEU, UEPI, UWBC, URBC, UBAC, CAST, CRYS, UCOM, BILUA,  in the last 72 hours  MICROBIOLOGY: No results found for this or any previous visit (from the past 240 hour(s)).  RADIOLOGY STUDIES/RESULTS: Dg Chest 2 View  10/17/2013   CLINICAL DATA:  Cough  EXAM: CHEST  2 VIEW  COMPARISON:  10/12/2013  FINDINGS: Cardiac silhouette is normal in size. Normal mediastinal and hilar contours.  Lungs are clear. Previously seen interstitial airspace opacities have resolved.  No  pleural effusion or pneumothorax.  Bony thorax is intact.  IMPRESSION: No active cardiopulmonary disease.   Electronically Signed   By: Lajean Manes M.D.   On: 10/17/2013 08:24   US Abdomen Complete  10/19/2013   CLINICAL DATA:  Elevated LFTs and abdominal pain  EXAM: ULTRASOUND ABDOMEN COMPLETE  COMPARISON:  08/17/2013  FINDINGS: Gallbladder:  No gallstones or wall thickening visualized. No sonographic Murphy sign noted.  Common bile duct:  Diameter: 2.8 mm  Liver:  No focal lesion identified. Within normal limits in parenchymal echogenicity.  IVC:  No abnormality visualized.  Pancreas:  There is a hypoechoic nodule along the  ventral surface of the pancreatic body measuring 1.2 x 0.6 x 1.0 cm.  Spleen:  Size and appearance within normal limits.  Right Kidney:  Length: 11.6 cm. Echogenicity within normal limits. No mass or hydronephrosis visualized.  Left Kidney:  Length: 11.4 cm. Echogenicity within normal limits. No mass or hydronephrosis visualized.  Abdominal aorta:  No aneurysm visualized.  Other findings:  None.  IMPRESSION: 1. No acute findings. 2. Indeterminate hypoechoic nodule along the ventral surface of the pancreatic body. Not seen on previous CT of the upper abdomen. Recommend nonemergent, followup imaging with pancreas protocol MRI or CT.   Electronically Signed   By: Kerby Moors M.D.   On: 10/19/2013 09:43   Dg Chest Port 1 View  10/12/2013   CLINICAL DATA:  Pneumonia.  EXAM: PORTABLE CHEST - 1 VIEW  COMPARISON:  10/08/2013  FINDINGS: The cardiac silhouette, mediastinal and hilar contours are stable. Persistent but improved bilateral interstitial and airspace process likely atypical pneumonia. No pleural effusion or pneumothorax.  IMPRESSION: Persistent but improved bilateral interstitial and airspace process.   Electronically Signed   By: Kalman Jewels M.D.   On: 10/12/2013 07:02   Dg Chest Port 1 View  10/08/2013   CLINICAL DATA:  Assess infiltrates.  EXAM: PORTABLE CHEST - 1 VIEW   COMPARISON:  10/06/2013.  FINDINGS: Bilateral irregular interstitial thickening appears increased from the prior study, although this may be in part due to lower lung volumes and a more semi-erect positioning.  No pleural effusion. No pneumothorax. Cardiac silhouette is normal in size. Normal mediastinal and hilar contours.  IMPRESSION: Increased interstitial thickening. Diffuse bilateral interstitial infiltrates versus interstitial edema. Degree of interstitial thickening has increased when compared the prior study.   Electronically Signed   By: Lajean Manes M.D.   On: 10/08/2013 20:35   Dg Chest Portable 1 View  10/06/2013   CLINICAL DATA:  Drug overdose  EXAM: PORTABLE CHEST - 1 VIEW  COMPARISON:  08/16/2013  FINDINGS: There is interstitial coarsening and mild hypoaeration. Normal heart size and mediastinal contours. No effusion or pneumothorax.  IMPRESSION: Diffuse interstitial prominence, likely from hypoaeration. Cannot exclude mild aspiration pneumonitis.   Electronically Signed   By: Jorje Guild M.D.   On: 10/06/2013 00:56   Dg Abd Portable 1v  10/14/2013   CLINICAL DATA:  Mid to left abdominal pain.  EXAM: PORTABLE ABDOMEN - 1 VIEW  COMPARISON:  10/06/2013  FINDINGS: Bowel gas pattern is nonobstructive. No organomegaly or abnormal calcifications identified. Visualized osseous structures have a normal appearance. Patient is rotated.  IMPRESSION: No evidence for acute  abnormality.   Electronically Signed   By: Shon Hale M.D.   On: 10/14/2013 10:01   Dg Abd Portable 1v  10/06/2013   CLINICAL DATA:  43 year old male drug overdose. Initial encounter.  EXAM: PORTABLE ABDOMEN - 1 VIEW  COMPARISON:  CT Abdomen and Pelvis 4 levin 2014.  FINDINGS: Portable AP supine view at 0046 hrs. Non obstructed bowel gas pattern. Stable right hemipelvis phlebolith. Evidence of a healed posterior right tenth rib fracture. No acute osseous abnormality identified. No definite pneumoperitoneum on this supine view.   IMPRESSION: Non obstructed bowel gas pattern.   Electronically Signed   By: Lars Pinks M.D.   On: 10/06/2013 00:56    Oren Binet, MD  [facility] Pager:[phone]  If 7PM-7AM, please contact night-coverage [email or web address] Password [email or web address] 10/20/2013, 11:44 AM   LOS: 15 days   **Disclaimer: This note may have been dictated with voice recognition software.  Similar sounding words can inadvertently be transcribed and this note may contain transcription errors which may not have been corrected upon publication of note.**

## 2013-10-20 NOTE — Consult Note (Signed)
[facility] Face-to-Face Psychiatry Consult Follow up   Reason for Consult:  Alcohol intoxication, depression and s/p suicide attempt Referring Physician:  Jonetta Osgood, MD  Richrd Sox Wolden is an 43 y.o. male. Total Time spent with patient: 45 minutes  Assessment: AXIS I:  Major Depression, Recurrent severe, Substance Induced Mood Disorder and Alcohol dependence AXIS II:  Deferred AXIS III:   Past Medical History  Diagnosis Date  . Hypertension   . Asthma   . Pancreatitis   . Cocaine abuse   . Depression   . H/O suicide attempt   . Heart murmur     "when he was little" (03/06/2013)  . Shortness of breath     "can happen at anytime" (03/06/2013)  . Anemia   . H/O hiatal hernia   . GERD (gastroesophageal reflux disease)   . Seizures     "weekly" (03/06/2013)  . Arthritis     "knees" (03/06/2013)  . Anxiety   . WPW (Wolff-Parkinson-White syndrome)     [name] 03/06/2013   AXIS IV:  economic problems, occupational problems, other psychosocial or environmental problems, problems related to social environment and problems with primary support group AXIS V:  41-50 serious symptoms  Plan:  Case discussed with Nonnie Done, LCSW and is waiting for the placement, referred to the [facility] and [facility] Continue Seroquel 25 mg BID and 100 mg Qhs for agitation and aggression Recommend psychiatric Inpatient admission when medically cleared. Supportive therapy provided about ongoing stressors. Continuous safety sitter and current medication management Appreciate psychiatric consultation Please contact [phone] if needs further assistance  Subjective:   [name] is a 43 y.o. male patient admitted with suicide attempt and alcohol intoxication.  HPI:  Patient was seen and chart reviewed. Psychiatric consultation was requested a suicide attempt while intoxicated. This is a 43 yo male with history of alcohol abuse versus dependence, elevated liver enzymes, depression/anxiety comes in after  intentionally ingesting some of his home meds to kill himself per intial report he told the emergency department on arrival. Patient was disheveled, poorly groomed and denies current symptoms of depression, anxiety and psychosis. Patient was placed in the 4 point restraints as he is being combative and removing catheters and intravenous tubes. He tells me he did this to try to get high off of the flexeril. He says he took 15 pills, combination of some of his flexeril and some of his clonidine. His girlfriend is pregnant and is planning on getting an abortion tomorrow, and they were argueing about that, and he got very upset and took a bunch of his pills. This occurred at about 10pm. He received charcoal in the emergency department. He also reports he has been drinking beer.   Interval history: The patient was seen today for psychiatric consultation follow up. Patient has no complaints and stated he is feeling better and stronger and willing to participate in [place] when bed is available. Patient mother who was at bed side says she support him and he has home to come back when he was more stable able to stay away from alcohol, drugs and from from depression and suicidal ideations and behaviors.   Patient mother does not believe he is going to be sober without any assistance. Patient also stated he is having cravings for smoking but denies drinking. Patient has agree to go for further treatment in a hospital after brief discussion. He has status post suicidal attempt. Patient stated that in right frame of mind he does  not want to kill him self. He has stressed about not abl to hold a job, relationship and history of legal charges and being on probation etc. Patient has been feeling much better and wants to go home with the plan of residential substance abuse treatment program at [facility] recovery services. Patient mother does not believe he can stay sober due to previous history. He stated he understands about his  situation with his girlfriend and does not want to continue to suffer with it.    HPI Elements:   Location:  Depression and alcohol abuse. Quality:  poor. Severity:  Acute. Timing:  Suicide attempt and intoxication.  Past Psychiatric History: Past Medical History  Diagnosis Date  . Hypertension   . Asthma   . Pancreatitis   . Cocaine abuse   . Depression   . H/O suicide attempt   . Heart murmur     "when he was little" (03/06/2013)  . Shortness of breath     "can happen at anytime" (03/06/2013)  . Anemia   . H/O hiatal hernia   . GERD (gastroesophageal reflux disease)   . Seizures     "weekly" (03/06/2013)  . Arthritis     "knees" (03/06/2013)  . Anxiety   . WPW (Wolff-Parkinson-White syndrome)     [name] 03/06/2013    reports that he has been smoking Cigarettes.  He has a 30 pack-year smoking history. He uses smokeless tobacco. He reports that he drinks about 25.2 ounces of alcohol per week. He reports that he uses illicit drugs (Cocaine and Marijuana). Family History  Problem Relation Age of Onset  . Hypertension Other   . Coronary artery disease Other      Living Arrangements: Parent   Abuse/Neglect [facility]) Physical Abuse: Denies Verbal Abuse: Denies Sexual Abuse: Denies Allergies:   Allergies  Allergen Reactions  . Trazodone And Nefazodone Other (See Comments)    Muscle spasms  . Shellfish-Derived Products Nausea And Vomiting    ACT Assessment Complete:  No Objective: Blood pressure 119/77, pulse 73, temperature 98 F (36.7 C), temperature source Oral, resp. rate 18, height 5\' 9"  (1.753 m), weight 59.2 kg (130 lb 8.2 oz), SpO2 98.00%.Body mass index is 19.26 kg/(m^2). Results for orders placed during the hospital encounter of 10/05/13 (from the past 72 hour(s))  COMPREHENSIVE METABOLIC PANEL     Status: Abnormal   Collection Time    10/19/13  8:25 AM      Result Value Ref Range   Sodium 136 (*) 137 - 147 mEq/L   Potassium 4.5  3.7 - 5.3 mEq/L    Chloride 98  96 - 112 mEq/L   CO2 26  19 - 32 mEq/L   Glucose, Bld 91  70 - 99 mg/dL   BUN 4 (*) 6 - 23 mg/dL   Creatinine, Ser 10/21/13  0.50 - 1.35 mg/dL   Calcium 9.1  8.4 - 9.98 mg/dL   Total Protein 7.6  6.0 - 8.3 g/dL   Albumin 2.3 (*) 3.5 - 5.2 g/dL   AST 46 (*) 0 - 37 U/L   ALT 43  0 - 53 U/L   Alkaline Phosphatase 181 (*) 39 - 117 U/L   Total Bilirubin 1.0  0.3 - 1.2 mg/dL   GFR calc non Af Amer >90  >90 mL/min   GFR calc Af Amer >90  >90 mL/min   Comment: (NOTE)     The eGFR has been calculated using the CKD EPI equation.     This  calculation has not been validated in all clinical situations.     eGFR's persistently <90 mL/min signify possible Chronic Kidney     Disease.  LIPASE, BLOOD     Status: Abnormal   Collection Time    10/19/13  8:25 AM      Result Value Ref Range   Lipase 614 (*) 11 - 59 U/L  CBC     Status: Abnormal   Collection Time    10/19/13  8:25 AM      Result Value Ref Range   WBC 13.4 (*) 4.0 - 10.5 K/uL   RBC 2.72 (*) 4.22 - 5.81 MIL/uL   Hemoglobin 8.5 (*) 13.0 - 17.0 g/dL   HCT 27.4 (*) 39.0 - 52.0 %   MCV 100.7 (*) 78.0 - 100.0 fL   MCH 31.3  26.0 - 34.0 pg   MCHC 31.0  30.0 - 36.0 g/dL   RDW 19.1 (*) 11.5 - 15.5 %   Platelets 285  150 - 400 K/uL  LIPID PANEL     Status: None   Collection Time    10/19/13  8:25 AM      Result Value Ref Range   Cholesterol 164  0 - 200 mg/dL   Triglycerides 108  <150 mg/dL   HDL 53  >39 mg/dL   Total CHOL/HDL Ratio 3.1     VLDL 22  0 - 40 mg/dL   LDL Cholesterol 89  0 - 99 mg/dL   Comment:            Total Cholesterol/HDL:CHD Risk     Coronary Heart Disease Risk Table                         Men   Women      1/2 Average Risk   3.4   3.3      Average Risk       5.0   4.4      2 X Average Risk   9.6   7.1      3 X Average Risk  23.4   11.0                Use the calculated Patient Ratio     above and the CHD Risk Table     to determine the patient's CHD Risk.                ATP III CLASSIFICATION  (LDL):      <100     mg/dL   Optimal      100-129  mg/dL   Near or Above                        Optimal      130-159  mg/dL   Borderline      160-189  mg/dL   High      >190     mg/dL   Very High  COMPREHENSIVE METABOLIC PANEL     Status: Abnormal   Collection Time    10/20/13  8:59 AM      Result Value Ref Range   Sodium 139  137 - 147 mEq/L   Potassium 4.4  3.7 - 5.3 mEq/L   Chloride 102  96 - 112 mEq/L   CO2 25  19 - 32 mEq/L   Glucose, Bld 70  70 - 99 mg/dL   BUN 5 (*) 6 - 23 mg/dL  Creatinine, Ser 0.62  0.50 - 1.35 mg/dL   Calcium 8.6  8.4 - 87.6 mg/dL   Total Protein 6.8  6.0 - 8.3 g/dL   Albumin 2.1 (*) 3.5 - 5.2 g/dL   AST 40 (*) 0 - 37 U/L   ALT 34  0 - 53 U/L   Alkaline Phosphatase 149 (*) 39 - 117 U/L   Total Bilirubin 0.9  0.3 - 1.2 mg/dL   GFR calc non Af Amer >90  >90 mL/min   GFR calc Af Amer >90  >90 mL/min   Comment: (NOTE)     The eGFR has been calculated using the CKD EPI equation.     This calculation has not been validated in all clinical situations.     eGFR's persistently <90 mL/min signify possible Chronic Kidney     Disease.  LIPASE, BLOOD     Status: Abnormal   Collection Time    10/20/13  8:59 AM      Result Value Ref Range   Lipase 175 (*) 11 - 59 U/L   Labs are reviewed and are pertinent for alcohol intoxication.  Current Facility-Administered Medications  Medication Dose Route Frequency Provider Last Rate Last Dose  . acetaminophen (TYLENOL) solution 650 mg  650 mg Oral Q6H PRN Lupita Leash, MD   650 mg at 10/19/13 2026  . albuterol (PROVENTIL) (2.5 MG/3ML) 0.083% nebulizer solution 2.5 mg  2.5 mg Inhalation Q2H PRN Coralyn Helling, MD      . alum & mag hydroxide-simeth (MAALOX/MYLANTA) 200-200-20 MG/5ML suspension 30 mL  30 mL Oral Q4H PRN Stephani Police, PA-C   30 mL at 10/20/13 1358  . benzonatate (TESSALON) capsule 100 mg  100 mg Oral TID PRN Leroy Sea, MD   100 mg at 10/20/13 1159  . bismuth subsalicylate (PEPTO BISMOL) 262  MG/15ML suspension 30 mL  30 mL Oral Q4H PRN Lupita Leash, MD      . cyclobenzaprine (FLEXERIL) tablet 5 mg  5 mg Oral Daily Leroy Sea, MD   5 mg at 10/20/13 0947  . enoxaparin (LOVENOX) injection 40 mg  40 mg Subcutaneous Q24H Maretta Bees, MD   40 mg at 10/20/13 1358  . folic acid (FOLVITE) tablet 1 mg  1 mg Oral Daily Candis Schatz Mancheril, RPH   1 mg at 10/20/13 0947  . LORazepam (ATIVAN) injection 2 mg  2 mg Intramuscular Once Leda Gauze, NP      . LORazepam (ATIVAN) tablet 1 mg  1 mg Oral Q6H PRN Gwenyth Bender, NP   1 mg at 10/20/13 0517  . menthol-cetylpyridinium (CEPACOL) lozenge 3 mg  1 lozenge Oral PRN Leroy Sea, MD   3 mg at 10/17/13 2128  . metoprolol (LOPRESSOR) injection 2.5 mg  2.5 mg Intravenous Q5 min PRN Stephani Police, PA-C      . metoprolol (LOPRESSOR) tablet 50 mg  50 mg Oral BID Stephani Police, PA-C   50 mg at 10/20/13 7811  . multivitamin with minerals tablet 1 tablet  1 tablet Oral Daily Nelda Bucks, MD   1 tablet at 10/20/13 0947  . oxyCODONE (Oxy IR/ROXICODONE) immediate release tablet 5 mg  5 mg Oral Q6H PRN Leroy Sea, MD   5 mg at 10/20/13 1358  . pantoprazole (PROTONIX) EC tablet 40 mg  40 mg Oral QHS Candis Schatz Mancheril, RPH   40 mg at 10/19/13 2150  . QUEtiapine (SEROQUEL) tablet 100 mg  100  mg Oral QHS Durward Parcel, MD   100 mg at 10/19/13 2149  . QUEtiapine (SEROQUEL) tablet 25 mg  25 mg Oral BID AC [name], MD   25 mg at 10/20/13 0739  . sodium chloride 0.9 % injection 3 mL  3 mL Intravenous Q12H Melton Alar, PA-C   3 mL at 10/19/13 2155  . sodium chloride 0.9 % injection 3 mL  3 mL Intravenous PRN Melton Alar, PA-C      . thiamine (VITAMIN B-1) tablet 100 mg  100 mg Oral Daily Darnell Level Mancheril, RPH   100 mg at 10/20/13 0947    Psychiatric Specialty Exam: Physical Exam  ROS  Blood pressure 119/77, pulse 73, temperature 98 F (36.7 C), temperature source Oral, resp. rate 18,  height $Remov'5\' 9"'VBaNge$  (1.753 m), weight 59.2 kg (130 lb 8.2 oz), SpO2 98.00%.Body mass index is 19.26 kg/(m^2).  General Appearance: Disheveled and Guarded  Engineer, water::  Fair  Speech:  Clear and Coherent and Slow  Volume:  Decreased  Mood:  Anxious and Depressed  Affect:  Depressed and Tearful  Thought Process:  Coherent and Goal Directed  Orientation:  Full (Time, Place, and Person)  Thought Content:  Rumination  Suicidal Thoughts:  Yes.  with intent/plan  Homicidal Thoughts:  No  Memory:  Immediate;   Fair Recent;   Fair  Judgement:  Intact  Insight:  Fair  Psychomotor Activity:  Decreased  Concentration:  Fair  Recall:  [employer] of Knowledge:Fair  Language: Fair  Akathisia:  NA  Handed:  Right  AIMS (if indicated):     Assets:  Communication Skills Desire for Improvement Housing Intimacy Leisure Time Resilience Social Support  Sleep:      Musculoskeletal: Strength & Muscle Tone: within normal limits Gait & Station: unable to stand Patient leans: N/A  Treatment Plan Summary: Daily contact with patient to assess and evaluate symptoms and progress in treatment Medication management  Timica Marcom,JANARDHAHA R. 10/20/2013 3:15 PM

## 2013-10-21 DIAGNOSIS — K859 Acute pancreatitis without necrosis or infection, unspecified: Secondary | ICD-10-CM

## 2013-10-21 MED ORDER — ENSURE COMPLETE PO LIQD
237.0000 mL | Freq: Two times a day (BID) | ORAL | Status: DC
  Administered 2013-10-22 (×2): 237 mL via ORAL

## 2013-10-21 MED ORDER — LORAZEPAM 1 MG PO TABS
1.0000 mg | ORAL_TABLET | Freq: Three times a day (TID) | ORAL | Status: DC | PRN
  Administered 2013-10-21 – 2013-10-22 (×4): 1 mg via ORAL
  Filled 2013-10-21 (×5): qty 1

## 2013-10-21 NOTE — Progress Notes (Signed)
PATIENT DETAILS Name: [name] Age: 43 y.o. Sex: male Date of Birth: 1971/03/18 Admit Date: 10/05/2013 Admitting Physician Phillips Grout, MD MPN:[email or web address], Gabrielle Dare, MD    Stable and ready for D/C to [facility] when bed is available.   Subjective: Slight abdominal burning over night. No complaints.  Feels well.  Assessment/Plan: Suicide attempt  -intentional overdose of flexeril, clonidine.  -Psychiatry was consulted and followed the patient daily making appropriate medication changes.  -Sitter remained at bedside. Suicide precautions were implemented  -The patient was involuntarily committed and will be transferred to behavioral health at discharge   Sinus tachycardia  -Chronic sinus tachycardia, going back in his chart has been present for at least 6 months, seen by electrophysiologist Dr.Croitoru, recommended supportive care only. Beta blocker for hypertension to be continued. He is symptom free.  Acute on chronic Pancreatitis  -Has been attributed to alcohol abuse.  Now resolved.  Had increased pain after eating a cheese burger brought in by his brother.  Pain now essentially resolved.  Will continue low fat diet.  Acute encephalopathy secondary to alcohol abuse  -Due to DTs, initially required placement in ICU. Now resolved. Continue folic acid and thiamine supplementation. As needed Ativan   Protein-calorie malnutrition, severe  -Protein supplements were recommended by nutrition and added to his diet   Mild leukocytosis  -Nonspecific, chest x-ray UA unremarkable, could be secondary to mild pancreatitis. We'll monitor closely.   Disposition: Remain inpatient transfer to [place] when bed available.  DVT Prophylaxis: Prophylactic Lovenox   Code Status: Full code   Family Communication Mother at bedside  Procedures:  None  CONSULTS:  psychiatry  MEDICATIONS: Scheduled Meds: . cyclobenzaprine  5 mg Oral Daily  . enoxaparin (LOVENOX) injection  40  mg Subcutaneous Q24H  . folic acid  1 mg Oral Daily  . LORazepam  2 mg Intramuscular Once  . metoprolol tartrate  50 mg Oral BID  . multivitamin with minerals  1 tablet Oral Daily  . pantoprazole  40 mg Oral QHS  . QUEtiapine  100 mg Oral QHS  . QUEtiapine  25 mg Oral BID AC  . sodium chloride  3 mL Intravenous Q12H  . thiamine  100 mg Oral Daily   Continuous Infusions:  PRN Meds:.acetaminophen (TYLENOL) oral liquid 160 mg/5 mL, albuterol, alum & mag hydroxide-simeth, benzonatate, bismuth subsalicylate, LORazepam, menthol-cetylpyridinium, metoprolol, oxyCODONE, sodium chloride  Antibiotics: Anti-infectives   None       PHYSICAL EXAM: Vital signs in last 24 hours: Filed Vitals:   10/20/13 1425 10/20/13 2154 10/21/13 0558 10/21/13 0950  BP: 119/77 120/64 98/62 115/73  Pulse: 73 91 76 104  Temp: 98 F (36.7 C) 97.9 F (36.6 C) 98.2 F (36.8 C)   TempSrc: Oral Oral Oral   Resp: 18 16 16    Height:      Weight:      SpO2: 98% 92% 98%     Weight change:  Filed Weights   10/05/13 2302 10/06/13 0002 10/06/13 0345  Weight: 66.679 kg (147 lb) 79.379 kg (175 lb) 59.2 kg (130 lb 8.2 oz)   Body mass index is 19.26 kg/(m^2).   Gen Exam: Awake and alert with clear speech.  Walking the hallway. Neck: Supple, No JVD.   Chest: B/L Clear.   CVS: S1 S2 Regular, no murmurs.  Abdomen: soft, BS +, non tender., non distended. No masses Extremities: no edema, lower extremities warm to touch. Neurologic: Non Focal.   Skin:  No Rash.    Intake/Output from previous day:  Intake/Output Summary (Last 24 hours) at 10/21/13 1116 Last data filed at 10/21/13 1001  Gross per 24 hour  Intake   4080 ml  Output      0 ml  Net   4080 ml     LAB RESULTS: CBC  Recent Labs Lab 10/17/13 0635 10/19/13 0825  WBC 12.9* 13.4*  HGB 7.8* 8.5*  HCT 24.9* 27.4*  PLT 206 285  MCV 99.6 100.7*  MCH 31.2 31.3  MCHC 31.3 31.0  RDW 19.4* 19.1*    Chemistries   Recent Labs Lab 10/16/13 0652  10/17/13 0635 10/19/13 0825 10/20/13 0859  NA 141 140 136* 139  K 4.4 4.2 4.5 4.4  CL 106 105 98 102  CO2 22 23 26 25   GLUCOSE 86 109* 91 70  BUN 3* 4* 4* 5*  CREATININE 0.61 0.64 0.56 0.62  CALCIUM 8.8 8.7 9.1 8.6  MG 1.8  --   --   --      Recent Labs  10/19/13 0825  CHOL 164  HDL 53  LDLCALC 89  TRIG 108  CHOLHDL 3.1    Recent Labs  10/19/13 0825 10/20/13 0859  LIPASE 614* 175*     RADIOLOGY STUDIES/RESULTS: Dg Chest 2 View  10/17/2013   CLINICAL DATA:  Cough  EXAM: CHEST  2 VIEW  COMPARISON:  10/12/2013  FINDINGS: Cardiac silhouette is normal in size. Normal mediastinal and hilar contours.  Lungs are clear. Previously seen interstitial airspace opacities have resolved.  No pleural effusion or pneumothorax.  Bony thorax is intact.  IMPRESSION: No active cardiopulmonary disease.   Electronically Signed   By: Lajean Manes M.D.   On: 10/17/2013 08:24   US Abdomen Complete  10/19/2013   CLINICAL DATA:  Elevated LFTs and abdominal pain  EXAM: ULTRASOUND ABDOMEN COMPLETE  COMPARISON:  08/17/2013  FINDINGS: Gallbladder:  No gallstones or wall thickening visualized. No sonographic Murphy sign noted.  Common bile duct:  Diameter: 2.8 mm  Liver:  No focal lesion identified. Within normal limits in parenchymal echogenicity.  IVC:  No abnormality visualized.  Pancreas:  There is a hypoechoic nodule along the ventral surface of the pancreatic body measuring 1.2 x 0.6 x 1.0 cm.  Spleen:  Size and appearance within normal limits.  Right Kidney:  Length: 11.6 cm. Echogenicity within normal limits. No mass or hydronephrosis visualized.  Left Kidney:  Length: 11.4 cm. Echogenicity within normal limits. No mass or hydronephrosis visualized.  Abdominal aorta:  No aneurysm visualized.  Other findings:  None.  IMPRESSION: 1. No acute findings. 2. Indeterminate hypoechoic nodule along the ventral surface of the pancreatic body. Not seen on previous CT of the upper abdomen. Recommend nonemergent,  followup imaging with pancreas protocol MRI or CT.   Electronically Signed   By: Kerby Moors M.D.   On: 10/19/2013 09:43   Dg Chest Port 1 View  10/12/2013   CLINICAL DATA:  Pneumonia.  EXAM: PORTABLE CHEST - 1 VIEW  COMPARISON:  10/08/2013  FINDINGS: The cardiac silhouette, mediastinal and hilar contours are stable. Persistent but improved bilateral interstitial and airspace process likely atypical pneumonia. No pleural effusion or pneumothorax.  IMPRESSION: Persistent but improved bilateral interstitial and airspace process.   Electronically Signed   By: Kalman Jewels M.D.   On: 10/12/2013 07:02   Dg Chest Port 1 View  10/08/2013   CLINICAL DATA:  Assess infiltrates.  EXAM: PORTABLE CHEST - 1 VIEW  COMPARISON:  10/06/2013.  FINDINGS: Bilateral irregular interstitial thickening appears increased from the prior study, although this may be in part due to lower lung volumes and a more semi-erect positioning.  No pleural effusion. No pneumothorax. Cardiac silhouette is normal in size. Normal mediastinal and hilar contours.  IMPRESSION: Increased interstitial thickening. Diffuse bilateral interstitial infiltrates versus interstitial edema. Degree of interstitial thickening has increased when compared the prior study.   Electronically Signed   By: Lajean Manes M.D.   On: 10/08/2013 20:35   Dg Chest Portable 1 View  10/06/2013   CLINICAL DATA:  Drug overdose  EXAM: PORTABLE CHEST - 1 VIEW  COMPARISON:  08/16/2013  FINDINGS: There is interstitial coarsening and mild hypoaeration. Normal heart size and mediastinal contours. No effusion or pneumothorax.  IMPRESSION: Diffuse interstitial prominence, likely from hypoaeration. Cannot exclude mild aspiration pneumonitis.   Electronically Signed   By: Jorje Guild M.D.   On: 10/06/2013 00:56   Dg Abd Portable 1v  10/14/2013   CLINICAL DATA:  Mid to left abdominal pain.  EXAM: PORTABLE ABDOMEN - 1 VIEW  COMPARISON:  10/06/2013  FINDINGS: Bowel gas pattern is  nonobstructive. No organomegaly or abnormal calcifications identified. Visualized osseous structures have a normal appearance. Patient is rotated.  IMPRESSION: No evidence for acute  abnormality.   Electronically Signed   By: Shon Hale M.D.   On: 10/14/2013 10:01   Dg Abd Portable 1v  10/06/2013   CLINICAL DATA:  43 year old male drug overdose. Initial encounter.  EXAM: PORTABLE ABDOMEN - 1 VIEW  COMPARISON:  CT Abdomen and Pelvis 4 levin 2014.  FINDINGS: Portable AP supine view at 0046 hrs. Non obstructed bowel gas pattern. Stable right hemipelvis phlebolith. Evidence of a healed posterior right tenth rib fracture. No acute osseous abnormality identified. No definite pneumoperitoneum on this supine view.  IMPRESSION: Non obstructed bowel gas pattern.   Electronically Signed   By: Lars Pinks M.D.   On: 10/06/2013 00:56    Karen Kitchens  [facility] Pager:[phone]   If 7PM-7AM, please contact night-coverage [email or web address] Password [email or web address] 10/21/2013, 11:16 AM   LOS: 16 days   **Disclaimer: This note may have been dictated with voice recognition software. Similar sounding words can inadvertently be transcribed and this note may contain transcription errors which may not have been corrected upon publication of note.**  Attending  Patient was seen, examined,treatment plan was discussed with the Physician extender. I have directly reviewed the clinical findings, lab, imaging studies and management of this patient in detail. I have made the necessary changes to the above noted documentation, and agree with the documentation, as recorded by the Physician extender.  Nena Alexander MD [facility].

## 2013-10-21 NOTE — Consult Note (Signed)
[name] Face-to-Face Psychiatry Consult Follow up   Reason for Consult:  Alcohol intoxication, depression and s/p suicide attempt Referring Physician:  Jonetta Osgood, MD  [name] is an 43 y.o. male. Total Time spent with patient: 45 minutes  Assessment: AXIS I:  Major Depression, Recurrent severe, Substance Induced Mood Disorder and Alcohol dependence AXIS II:  Deferred AXIS III:   Past Medical History  Diagnosis Date  . Hypertension   . Asthma   . Pancreatitis   . Cocaine abuse   . Depression   . H/O suicide attempt   . Heart murmur     "when he was little" (03/06/2013)  . Shortness of breath     "can happen at anytime" (03/06/2013)  . Anemia   . H/O hiatal hernia   . GERD (gastroesophageal reflux disease)   . Seizures     "weekly" (03/06/2013)  . Arthritis     "knees" (03/06/2013)  . Anxiety   . WPW (Wolff-Parkinson-White syndrome)     [name] 03/06/2013   AXIS IV:  economic problems, occupational problems, other psychosocial or environmental problems, problems related to social environment and problems with primary support group AXIS V:  41-50 serious symptoms  Plan:  He is waiting for the placement, referred to the [facility] and [facility] Continue Seroquel 25 mg BID and 100 mg Qhs for agitation and aggression Recommend psychiatric Inpatient admission when medically cleared. Supportive therapy provided about ongoing stressors. Continuous safety sitter and current medication management Appreciate psychiatric consultation Please contact [phone] if needs further assistance  Subjective:   [name] is a 43 y.o. male patient admitted with suicide attempt and alcohol intoxication.  HPI:  Patient was seen and chart reviewed. Psychiatric consultation was requested a suicide attempt while intoxicated. This is a 43 yo male with history of alcohol abuse versus dependence, elevated liver enzymes, depression/anxiety comes in after intentionally ingesting some of his home  meds to kill himself per intial report he told the emergency department on arrival. Patient was disheveled, poorly groomed and denies current symptoms of depression, anxiety and psychosis. Patient was placed in the 4 point restraints as he is being combative and removing catheters and intravenous tubes. He tells me he did this to try to get high off of the flexeril. He says he took 15 pills, combination of some of his flexeril and some of his clonidine. His girlfriend is pregnant and is planning on getting an abortion tomorrow, and they were argueing about that, and he got very upset and took a bunch of his pills. This occurred at about 10pm. He received charcoal in the emergency department. He also reports he has been drinking beer.   Interval history: The patient was seen today for psychiatric consultation follow up. He stated he wants to find his ex-GF regarding the pregnancy and than has to decide which way he wants to go with the relationship. he has poor understanding how the relationship is affecting his health and life and continue to be ruminated about the issue. Patient has no complaints and stated he is feeling better and stronger and willing to participate in [place] when bed is available.   Patient mother who was at bed side says she support him and he has home to come back when he was more stable able to stay away from alcohol, drugs and from from depression and suicidal ideations and behaviors. Patient mother does not believe he is going to be sober without any assistance. Patient also stated  he is having cravings for smoking but denies drinking. Patient has agree to go for further treatment in a [facility] after brief discussion. He has status post suicidal attempt. Patient stated that in right frame of mind he does not want to kill him self. He has stressed about not abl to hold a job, relationship and history of legal charges and being on probation etc. Patient has been feeling much better and  wants to go home with the plan of residential substance abuse treatment program [facility]. Patient mother does not believe he can stay sober due to previous history.    HPI Elements:   Location:  Depression and alcohol abuse. Quality:  poor. Severity:  Acute. Timing:  Suicide attempt and intoxication.  Past Psychiatric History: Past Medical History  Diagnosis Date  . Hypertension   . Asthma   . Pancreatitis   . Cocaine abuse   . Depression   . H/O suicide attempt   . Heart murmur     "when he was little" (03/06/2013)  . Shortness of breath     "can happen at anytime" (03/06/2013)  . Anemia   . H/O hiatal hernia   . GERD (gastroesophageal reflux disease)   . Seizures     "weekly" (03/06/2013)  . Arthritis     "knees" (03/06/2013)  . Anxiety   . WPW (Wolff-Parkinson-White syndrome)     [name] 03/06/2013    reports that he has been smoking Cigarettes.  He has a 30 pack-year smoking history. He uses smokeless tobacco. He reports that he drinks about 25.2 ounces of alcohol per week. He reports that he uses illicit drugs (Cocaine and Marijuana). Family History  Problem Relation Age of Onset  . Hypertension Other   . Coronary artery disease Other      Living Arrangements: Parent   Abuse/Neglect [facility]) Physical Abuse: Denies Verbal Abuse: Denies Sexual Abuse: Denies Allergies:   Allergies  Allergen Reactions  . Trazodone And Nefazodone Other (See Comments)    Muscle spasms  . Shellfish-Derived Products Nausea And Vomiting    ACT Assessment Complete:  No Objective: Blood pressure 110/70, pulse 84, temperature 99.1 F (37.3 C), temperature source Oral, resp. rate 16, height $RemoveBe'5\' 9"'bRTgEfllh$  (1.753 m), weight 59.2 kg (130 lb 8.2 oz), SpO2 100.00%.Body mass index is 19.26 kg/(m^2). Results for orders placed during the [facility] encounter of 10/05/13 (from the past 72 hour(s))  COMPREHENSIVE METABOLIC PANEL     Status: Abnormal   Collection Time    10/19/13  8:25  AM      Result Value Ref Range   Sodium 136 (*) 137 - 147 mEq/L   Potassium 4.5  3.7 - 5.3 mEq/L   Chloride 98  96 - 112 mEq/L   CO2 26  19 - 32 mEq/L   Glucose, Bld 91  70 - 99 mg/dL   BUN 4 (*) 6 - 23 mg/dL   Creatinine, Ser 0.56  0.50 - 1.35 mg/dL   Calcium 9.1  8.4 - 10.5 mg/dL   Total Protein 7.6  6.0 - 8.3 g/dL   Albumin 2.3 (*) 3.5 - 5.2 g/dL   AST 46 (*) 0 - 37 U/L   ALT 43  0 - 53 U/L   Alkaline Phosphatase 181 (*) 39 - 117 U/L   Total Bilirubin 1.0  0.3 - 1.2 mg/dL   GFR calc non Af Amer >90  >90 mL/min   GFR calc Af Amer >90  >90 mL/min   Comment: (NOTE)  The eGFR has been calculated using the CKD EPI equation.     This calculation has not been validated in all clinical situations.     eGFR's persistently <90 mL/min signify possible Chronic Kidney     Disease.  LIPASE, BLOOD     Status: Abnormal   Collection Time    10/19/13  8:25 AM      Result Value Ref Range   Lipase 614 (*) 11 - 59 U/L  CBC     Status: Abnormal   Collection Time    10/19/13  8:25 AM      Result Value Ref Range   WBC 13.4 (*) 4.0 - 10.5 K/uL   RBC 2.72 (*) 4.22 - 5.81 MIL/uL   Hemoglobin 8.5 (*) 13.0 - 17.0 g/dL   HCT 27.0 (*) 35.0 - 09.3 %   MCV 100.7 (*) 78.0 - 100.0 fL   MCH 31.3  26.0 - 34.0 pg   MCHC 31.0  30.0 - 36.0 g/dL   RDW 81.8 (*) 29.9 - 37.1 %   Platelets 285  150 - 400 K/uL  LIPID PANEL     Status: None   Collection Time    10/19/13  8:25 AM      Result Value Ref Range   Cholesterol 164  0 - 200 mg/dL   Triglycerides 696  <789 mg/dL   HDL 53  >38 mg/dL   Total CHOL/HDL Ratio 3.1     VLDL 22  0 - 40 mg/dL   LDL Cholesterol 89  0 - 99 mg/dL   Comment:            Total Cholesterol/HDL:CHD Risk     Coronary Heart Disease Risk Table                         Men   Women      1/2 Average Risk   3.4   3.3      Average Risk       5.0   4.4      2 X Average Risk   9.6   7.1      3 X Average Risk  23.4   11.0                Use the calculated Patient Ratio     above and  the CHD Risk Table     to determine the patient's CHD Risk.                ATP III CLASSIFICATION (LDL):      <100     mg/dL   Optimal      101-751  mg/dL   Near or Above                        Optimal      130-159  mg/dL   Borderline      025-852  mg/dL   High      >778     mg/dL   Very High  COMPREHENSIVE METABOLIC PANEL     Status: Abnormal   Collection Time    10/20/13  8:59 AM      Result Value Ref Range   Sodium 139  137 - 147 mEq/L   Potassium 4.4  3.7 - 5.3 mEq/L   Chloride 102  96 - 112 mEq/L   CO2 25  19 - 32 mEq/L   Glucose, Bld 70  70 - 99 mg/dL   BUN 5 (*) 6 - 23 mg/dL   Creatinine, Ser 0.62  0.50 - 1.35 mg/dL   Calcium 8.6  8.4 - 10.5 mg/dL   Total Protein 6.8  6.0 - 8.3 g/dL   Albumin 2.1 (*) 3.5 - 5.2 g/dL   AST 40 (*) 0 - 37 U/L   ALT 34  0 - 53 U/L   Alkaline Phosphatase 149 (*) 39 - 117 U/L   Total Bilirubin 0.9  0.3 - 1.2 mg/dL   GFR calc non Af Amer >90  >90 mL/min   GFR calc Af Amer >90  >90 mL/min   Comment: (NOTE)     The eGFR has been calculated using the CKD EPI equation.     This calculation has not been validated in all clinical situations.     eGFR's persistently <90 mL/min signify possible Chronic Kidney     Disease.  LIPASE, BLOOD     Status: Abnormal   Collection Time    10/20/13  8:59 AM      Result Value Ref Range   Lipase 175 (*) 11 - 59 U/L   Labs are reviewed and are pertinent for alcohol intoxication.  Current Facility-Administered Medications  Medication Dose Route Frequency Provider Last Rate Last Dose  . acetaminophen (TYLENOL) solution 650 mg  650 mg Oral Q6H PRN Juanito Doom, MD   650 mg at 10/19/13 2026  . albuterol (PROVENTIL) (2.5 MG/3ML) 0.083% nebulizer solution 2.5 mg  2.5 mg Inhalation Q2H PRN Chesley Mires, MD      . alum & mag hydroxide-simeth (MAALOX/MYLANTA) 200-200-20 MG/5ML suspension 30 mL  30 mL Oral Q4H PRN Melton Alar, PA-C   30 mL at 10/21/13 1254  . benzonatate (TESSALON) capsule 100 mg  100 mg Oral  TID PRN [name], MD   100 mg at 10/21/13 0122  . bismuth subsalicylate (PEPTO BISMOL) 262 MG/15ML suspension 30 mL  30 mL Oral Q4H PRN Juanito Doom, MD      . cyclobenzaprine (FLEXERIL) tablet 5 mg  5 mg Oral Daily [name], MD   5 mg at 10/21/13 0946  . enoxaparin (LOVENOX) injection 40 mg  40 mg Subcutaneous Q24H Jason Osgood, MD   40 mg at 10/21/13 1253  . feeding supplement (ENSURE COMPLETE) (ENSURE COMPLETE) liquid 237 mL  237 mL Oral BID BM Erlene Quan, RD      . folic acid (FOLVITE) tablet 1 mg  1 mg Oral Daily Darnell Level Mancheril, RPH   1 mg at 10/21/13 0946  . LORazepam (ATIVAN) injection 2 mg  2 mg Intramuscular Once Gardiner Barefoot, NP      . LORazepam (ATIVAN) tablet 1 mg  1 mg Oral TID PRN Melton Alar, PA-C      . menthol-cetylpyridinium (CEPACOL) lozenge 3 mg  1 lozenge Oral PRN [name], MD   3 mg at 10/21/13 0808  . metoprolol (LOPRESSOR) injection 2.5 mg  2.5 mg Intravenous Q5 min PRN Melton Alar, PA-C      . metoprolol (LOPRESSOR) tablet 50 mg  50 mg Oral BID Melton Alar, PA-C   50 mg at 10/21/13 0950  . multivitamin with minerals tablet 1 tablet  1 tablet Oral Daily Raylene Miyamoto, MD   1 tablet at 10/21/13 [phone]  . oxyCODONE (Oxy IR/ROXICODONE) immediate release tablet 5 mg  5 mg Oral Q6H PRN [name], MD   5  mg at 10/21/13 1254  . pantoprazole (PROTONIX) EC tablet 40 mg  40 mg Oral QHS Darnell Level Mancheril, RPH   40 mg at 10/20/13 2222  . QUEtiapine (SEROQUEL) tablet 100 mg  100 mg Oral QHS Durward Parcel, MD   100 mg at 10/20/13 2223  . QUEtiapine (SEROQUEL) tablet 25 mg  25 mg Oral BID AC [name], MD   25 mg at 10/21/13 0802  . sodium chloride 0.9 % injection 3 mL  3 mL Intravenous Q12H Melton Alar, PA-C   3 mL at 10/21/13 0946  . sodium chloride 0.9 % injection 3 mL  3 mL Intravenous PRN Melton Alar, PA-C      . thiamine (VITAMIN B-1) tablet 100 mg  100 mg Oral Daily Darnell Level  Mancheril, RPH   100 mg at 10/21/13 0946    Psychiatric Specialty Exam: Physical Exam  ROS  Blood pressure 110/70, pulse 84, temperature 99.1 F (37.3 C), temperature source Oral, resp. rate 16, height _0  (1.753 m), weight 59.2 kg (130 lb 8.2 oz), SpO2 100.00%.Body mass index is 19.26 kg/(m^2).  General Appearance: Disheveled and Guarded  Engineer, water::  Fair  Speech:  Clear and Coherent and Slow  Volume:  Decreased  Mood:  Anxious and Depressed  Affect:  Depressed and Tearful  Thought Process:  Coherent and Goal Directed  Orientation:  Full (Time, Place, and Person)  Thought Content:  Rumination  Suicidal Thoughts:  Yes.  with intent/plan  Homicidal Thoughts:  No  Memory:  Immediate;   Fair Recent;   Fair  Judgement:  Intact  Insight:  Fair  Psychomotor Activity:  Decreased  Concentration:  Fair  Recall:  [employer] of Knowledge:Fair  Language: Fair  Akathisia:  NA  Handed:  Right  AIMS (if indicated):     Assets:  Communication Skills Desire for Improvement Housing Intimacy Leisure Time Resilience Social Support  Sleep:      Musculoskeletal: Strength & Muscle Tone: within normal limits Gait & Station: unable to stand Patient leans: N/A  Treatment Plan Summary: Daily contact with patient to assess and evaluate symptoms and progress in treatment Medication management  Elishah Ashmore,JANARDHAHA R. 10/21/2013 3:22 PM

## 2013-10-21 NOTE — Progress Notes (Signed)
NUTRITION FOLLOW UP  Pt meets criteria for severe MALNUTRITION in the context of chronic illness as evidenced by severe depletion of muscle mass and 12% weight loss within one month.  Intervention:   Add Ensure Complete po BID, each supplement provides 350 kcal and 13 grams of protein. Further diet advancement per team's discretion. RD to continue to follow nutrition care plan.  Nutrition Dx:   No new nutrition dx at this time.  Goal:   Intake to meet >90% of estimated nutrition needs, met.  Monitor:   PO intake, labs, weight trend.  Assessment:   43 y.o. M brought to ED 5/31 after he intentionally overdosed on flexeril and clonodine as part of suicide attempt. Was admitted by [facility]; however, became more agitated/combative. On 6/2, PCCM was consulted for transfer to ICU and initiation of precedex.  Pt now on Full Liquid diet. Pt had increased pain after eating a cheeseburger brought in by his brother. Pt is stable for d/c when bed available.   Eating 100% of meals. Pt reports that he is hungry with his full liquid trays. Agreeable to Ensure Complete po BID.  Potassium WNL Phosphorus elevated at 4.7 Magnesium WNL  Height: Ht Readings from Last 1 Encounters:  10/06/13 5' 9" (1.753 m)    Weight Status:   Wt Readings from Last 1 Encounters:  10/06/13 130 lb 8.2 oz (59.2 kg)  Admit wt 147 lb  Re-estimated needs:  Kcal: 1900-2100  Protein: 90-110 gm  Fluid: 2 L  Skin: intact  Diet Order: Full Liquid   Intake/Output Summary (Last 24 hours) at 10/21/13 1222 Last data filed at 10/21/13 1001  Gross per 24 hour  Intake   3780 ml  Output      0 ml  Net   3780 ml    Last BM: 6/14   Labs:   Recent Labs Lab 10/16/13 0652 10/17/13 0635 10/19/13 0825 10/20/13 0859  NA 141 140 136* 139  K 4.4 4.2 4.5 4.4  CL 106 105 98 102  CO2 22 23 26 25  BUN 3* 4* 4* 5*  CREATININE 0.61 0.64 0.56 0.62  CALCIUM 8.8 8.7 9.1 8.6  MG 1.8  --   --   --   PHOS 4.7*  --   --   --    GLUCOSE 86 109* 91 70    CBG (last 3)  No results found for this basename: GLUCAP,  in the last 72 hours  Scheduled Meds: . cyclobenzaprine  5 mg Oral Daily  . enoxaparin (LOVENOX) injection  40 mg Subcutaneous Q24H  . folic acid  1 mg Oral Daily  . LORazepam  2 mg Intramuscular Once  . metoprolol tartrate  50 mg Oral BID  . multivitamin with minerals  1 tablet Oral Daily  . pantoprazole  40 mg Oral QHS  . QUEtiapine  100 mg Oral QHS  . QUEtiapine  25 mg Oral BID AC  . sodium chloride  3 mL Intravenous Q12H  . thiamine  100 mg Oral Daily    Continuous Infusions:      MS, RD, LDN Inpatient Registered Dietitian Pager: [phone] After-hours pager: [phone]     

## 2013-10-22 ENCOUNTER — Encounter (HOSPITAL_COMMUNITY): Payer: Self-pay

## 2013-10-22 ENCOUNTER — Inpatient Hospital Stay (HOSPITAL_COMMUNITY)
Admission: AD | Admit: 2013-10-22 | Discharge: 2013-10-25 | DRG: 885 | Disposition: A | Discharge status: Home / Self Care | Payer: Federal, State, Local not specified - Other | Source: Intra-hospital | Attending: Psychiatry | Admitting: Psychiatry

## 2013-10-22 DIAGNOSIS — F410 Panic disorder [episodic paroxysmal anxiety] without agoraphobia: Secondary | ICD-10-CM | POA: Diagnosis present

## 2013-10-22 DIAGNOSIS — K219 Gastro-esophageal reflux disease without esophagitis: Secondary | ICD-10-CM | POA: Diagnosis present

## 2013-10-22 DIAGNOSIS — F3289 Other specified depressive episodes: Secondary | ICD-10-CM

## 2013-10-22 DIAGNOSIS — F329 Major depressive disorder, single episode, unspecified: Secondary | ICD-10-CM

## 2013-10-22 DIAGNOSIS — F10230 Alcohol dependence with withdrawal, uncomplicated: Secondary | ICD-10-CM

## 2013-10-22 DIAGNOSIS — Z8249 Family history of ischemic heart disease and other diseases of the circulatory system: Secondary | ICD-10-CM

## 2013-10-22 DIAGNOSIS — F1024 Alcohol dependence with alcohol-induced mood disorder: Secondary | ICD-10-CM

## 2013-10-22 DIAGNOSIS — J45909 Unspecified asthma, uncomplicated: Secondary | ICD-10-CM | POA: Diagnosis present

## 2013-10-22 DIAGNOSIS — F332 Major depressive disorder, recurrent severe without psychotic features: Principal | ICD-10-CM | POA: Diagnosis present

## 2013-10-22 DIAGNOSIS — M1710 Unilateral primary osteoarthritis, unspecified knee: Secondary | ICD-10-CM | POA: Diagnosis present

## 2013-10-22 DIAGNOSIS — F1994 Other psychoactive substance use, unspecified with psychoactive substance-induced mood disorder: Secondary | ICD-10-CM | POA: Diagnosis present

## 2013-10-22 DIAGNOSIS — F411 Generalized anxiety disorder: Secondary | ICD-10-CM | POA: Diagnosis present

## 2013-10-22 DIAGNOSIS — IMO0002 Reserved for concepts with insufficient information to code with codable children: Secondary | ICD-10-CM

## 2013-10-22 DIAGNOSIS — F17200 Nicotine dependence, unspecified, uncomplicated: Secondary | ICD-10-CM | POA: Diagnosis present

## 2013-10-22 DIAGNOSIS — G4700 Insomnia, unspecified: Secondary | ICD-10-CM | POA: Diagnosis present

## 2013-10-22 DIAGNOSIS — I10 Essential (primary) hypertension: Secondary | ICD-10-CM | POA: Diagnosis present

## 2013-10-22 DIAGNOSIS — F1020 Alcohol dependence, uncomplicated: Secondary | ICD-10-CM | POA: Diagnosis present

## 2013-10-22 MED ORDER — ENSURE COMPLETE PO LIQD: 237.0000 mL | Freq: Two times a day (BID) | ORAL | Status: DC

## 2013-10-22 MED ORDER — QUETIAPINE FUMARATE 100 MG PO TABS
100.0000 mg | ORAL_TABLET | Freq: Every day | ORAL | Status: DC
  Administered 2013-10-22 – 2013-10-24 (×3): 100 mg via ORAL
  Filled 2013-10-22 (×4): qty 1
  Filled 2013-10-22: qty 14
  Filled 2013-10-22: qty 1

## 2013-10-22 MED ORDER — ONDANSETRON 4 MG PO TBDP: 4.0000 mg | ORAL_TABLET | Freq: Three times a day (TID) | ORAL | Status: DC | PRN

## 2013-10-22 MED ORDER — ALBUTEROL SULFATE HFA 108 (90 BASE) MCG/ACT IN AERS: 1.0000 | INHALATION_SPRAY | Freq: Four times a day (QID) | RESPIRATORY_TRACT | Status: DC | PRN

## 2013-10-22 MED ORDER — QUETIAPINE FUMARATE 25 MG PO TABS
25.0000 mg | ORAL_TABLET | Freq: Two times a day (BID) | ORAL | Status: DC
  Administered 2013-10-23 – 2013-10-25 (×5): 25 mg via ORAL
  Filled 2013-10-22 (×3): qty 1
  Filled 2013-10-22: qty 28
  Filled 2013-10-22 (×2): qty 1
  Filled 2013-10-22: qty 28
  Filled 2013-10-22 (×3): qty 1

## 2013-10-22 MED ORDER — ALUM & MAG HYDROXIDE-SIMETH 200-200-20 MG/5ML PO SUSP
30.0000 mL | ORAL | Status: DC | PRN
  Administered 2013-10-23 – 2013-10-25 (×3): 30 mL via ORAL

## 2013-10-22 MED ORDER — LORAZEPAM 1 MG PO TABS
1.0000 mg | ORAL_TABLET | Freq: Four times a day (QID) | ORAL | Status: DC | PRN
  Administered 2013-10-22 – 2013-10-23 (×2): 1 mg via ORAL
  Filled 2013-10-22 (×2): qty 1

## 2013-10-22 MED ORDER — ENSURE COMPLETE PO LIQD
237.0000 mL | Freq: Two times a day (BID) | ORAL | Status: DC
  Administered 2013-10-23 – 2013-10-24 (×4): 237 mL via ORAL

## 2013-10-22 MED ORDER — NICOTINE 14 MG/24HR TD PT24: 14.0000 mg | MEDICATED_PATCH | Freq: Every day | TRANSDERMAL | Status: DC

## 2013-10-22 MED ORDER — MAGNESIUM HYDROXIDE 400 MG/5ML PO SUSP
30.0000 mL | Freq: Every day | ORAL | Status: DC | PRN
Start: 2013-10-22 — End: 2013-10-25

## 2013-10-22 MED ORDER — PANCRELIPASE (LIP-PROT-AMYL) 12000-38000 UNITS PO CPEP: 1.0000 | ORAL_CAPSULE | Freq: Three times a day (TID) | ORAL | Status: DC

## 2013-10-22 MED ORDER — OXYCODONE-ACETAMINOPHEN 5-325 MG PO TABS
1.0000 | ORAL_TABLET | ORAL | Status: DC | PRN
  Administered 2013-10-23 – 2013-10-25 (×9): 2 via ORAL
  Filled 2013-10-22 (×4): qty 2
  Filled 2013-10-22: qty 1
  Filled 2013-10-22: qty 2
  Filled 2013-10-22: qty 1
  Filled 2013-10-22 (×3): qty 2

## 2013-10-22 MED ORDER — PANTOPRAZOLE SODIUM 40 MG PO TBEC
40.0000 mg | DELAYED_RELEASE_TABLET | Freq: Every day | ORAL | Status: DC
  Administered 2013-10-22 – 2013-10-24 (×3): 40 mg via ORAL
  Filled 2013-10-22 (×6): qty 1

## 2013-10-22 MED ORDER — NICOTINE 14 MG/24HR TD PT24
14.0000 mg | MEDICATED_PATCH | Freq: Every day | TRANSDERMAL | Status: DC
  Administered 2013-10-23 – 2013-10-25 (×3): 14 mg via TRANSDERMAL
  Filled 2013-10-22 (×6): qty 1

## 2013-10-22 MED ORDER — PANCRELIPASE (LIP-PROT-AMYL) 12000-38000 UNITS PO CPEP
1.0000 | ORAL_CAPSULE | Freq: Three times a day (TID) | ORAL | Status: DC
  Administered 2013-10-23 – 2013-10-25 (×8): 1 via ORAL
  Filled 2013-10-22 (×15): qty 1

## 2013-10-22 MED ORDER — METOPROLOL TARTRATE 50 MG PO TABS
50.0000 mg | ORAL_TABLET | Freq: Two times a day (BID) | ORAL | Status: DC
  Administered 2013-10-22 – 2013-10-25 (×6): 50 mg via ORAL
  Filled 2013-10-22 (×6): qty 1
  Filled 2013-10-22: qty 2
  Filled 2013-10-22 (×5): qty 1

## 2013-10-22 MED ORDER — NICOTINE 14 MG/24HR TD PT24
14.0000 mg | MEDICATED_PATCH | Freq: Every day | TRANSDERMAL | Status: DC
  Administered 2013-10-22: 14 mg via TRANSDERMAL
  Filled 2013-10-22: qty 1

## 2013-10-22 MED ORDER — PANCRELIPASE (LIP-PROT-AMYL) 12000-38000 UNITS PO CPEP
1.0000 | ORAL_CAPSULE | Freq: Three times a day (TID) | ORAL | Status: DC
  Administered 2013-10-22 (×2): 1 via ORAL
  Filled 2013-10-22 (×3): qty 1

## 2013-10-22 MED ORDER — LACTULOSE 10 GM/15ML PO SOLN
10.0000 g | Freq: Every day | ORAL | Status: DC
  Administered 2013-10-23 – 2013-10-25 (×3): 10 g via ORAL
  Filled 2013-10-22 (×5): qty 15

## 2013-10-22 NOTE — Progress Notes (Signed)
Patient ID: [name], male   DOB: [birth year], 43 y.o.   MRN: [id number] Pt is a 43yr old male that came in with an SI- OD on 25 flexeril and 45 klonopin, chasing it with ETOH. Pt stated his neighbor walked in on him as he was chewing on the pills. Pt stated it stemmed from his 44 yr old gf telling him she as pregnant and that she was going to get an abortion. She also told him that she had been cheating on him. Pt is unemployed, and has a Field seismologist. Pt has a hx of pancreatis, WPW syndrome, heart murur, cocaine abuse, and HTN. Pt has a shellfish allergy. Pt offered meal. Introduced to unit. denies si/hi/avh. Rates pain 7/10 in abdomen.

## 2013-10-22 NOTE — Tx Team (Signed)
Initial Interdisciplinary Treatment Plan  PATIENT STRENGTHS: (choose at least two) Ability for insight Communication skills General fund of knowledge Motivation for treatment/growth  PATIENT STRESSORS: Financial difficulties Health problems Marital or family conflict Substance abuse   PROBLEM LIST: Problem List/Patient Goals Date to be addressed Date deferred Reason deferred Estimated date of resolution  SI 10/22/13     depression 10/22/13     etoh detox 10/22/13     anxiety 10/22/13                                    DISCHARGE CRITERIA:  Ability to meet basic life and health needs Adequate post-discharge living arrangements Improved stabilization in mood, thinking, and/or behavior Reduction of life-threatening or endangering symptoms to within safe limits  PRELIMINARY DISCHARGE PLAN: Attend aftercare/continuing care group Attend PHP/IOP Outpatient therapy Placement in alternative living arrangements  PATIENT/FAMIILY INVOLVEMENT: This treatment plan has been presented to and reviewed with the patient, [name].  The patient and family have been given the opportunity to ask questions and make suggestions.  Mindi Slicker M 10/22/2013, 11:18 PM

## 2013-10-22 NOTE — Progress Notes (Signed)
CSW called [facility] to transport pt to [facility].

## 2013-10-22 NOTE — Consult Note (Signed)
[facility] Face-to-Face Psychiatry Consult Follow up   Reason for Consult:  Alcohol intoxication, depression and s/p suicide attempt Referring Physician:  Jonetta Osgood, MD  [name] is an 43 y.o. male. Total Time spent with patient: 45 minutes  Assessment: AXIS I:  Major Depression, Recurrent severe, Substance Induced Mood Disorder and Alcohol dependence AXIS II:  Deferred AXIS III:   Past Medical History  Diagnosis Date  . Hypertension   . Asthma   . Pancreatitis   . Cocaine abuse   . Depression   . H/O suicide attempt   . Heart murmur     "when he was little" (03/06/2013)  . Shortness of breath     "can happen at anytime" (03/06/2013)  . Anemia   . H/O hiatal hernia   . GERD (gastroesophageal reflux disease)   . Seizures     "weekly" (03/06/2013)  . Arthritis     "knees" (03/06/2013)  . Anxiety   . WPW (Wolff-Parkinson-White syndrome)     [name] 03/06/2013   AXIS IV:  economic problems, occupational problems, other psychosocial or environmental problems, problems related to social environment and problems with primary support group AXIS V:  41-50 serious symptoms  Plan:  He is waiting for the placement, referred to the [facility] and [facility] Continue Seroquel 25 mg BID and 100 mg Qhs for agitation and aggression Recommend psychiatric Inpatient admission when medically cleared. Supportive therapy provided about ongoing stressors. Continuous safety sitter and current medication management Appreciate psychiatric consultation Please contact [phone] if needs further assistance  Subjective:   [name] is a 43 y.o. male patient admitted with suicide attempt and alcohol intoxication.  HPI:  Patient was seen and chart reviewed. Psychiatric consultation was requested a suicide attempt while intoxicated. This is a 43 yo male with history of alcohol abuse versus dependence, elevated liver enzymes, depression/anxiety comes in after intentionally ingesting some of  his home meds to kill himself per intial report he told the emergency department on arrival. Patient was disheveled, poorly groomed and denies current symptoms of depression, anxiety and psychosis. Patient was placed in the 4 point restraints as he is being combative and removing catheters and intravenous tubes. He tells me he did this to try to get high off of the flexeril. He says he took 15 pills, combination of some of his flexeril and some of his clonidine. His girlfriend is pregnant and is planning on getting an abortion tomorrow, and they were argueing about that, and he got very upset and took a bunch of his pills. This occurred at about 10pm. He received charcoal in the emergency department. He also reports he has been drinking beer.   Interval history: The patient has no complaints today and stated that he is waiting for placement. Patient denied current symptoms of depression, anxiety and psychosis. Patient denied suicidal or homicidal ideations. He was seen today for psychiatric consultation follow up. He wants to find his ex-GF pregnancy and than has to decide which way he wants to go with the relationship. He has poor understanding how the relationship is affecting his health and life and continue to be ruminated about the issue. Patient has been place on waiting list for [facility] when bed is available.   HPI Elements:   Location:  Depression and alcohol abuse. Quality:  poor. Severity:  Acute. Timing:  Suicide attempt and intoxication.  Past Psychiatric History: Past Medical History  Diagnosis Date  . Hypertension   . Asthma   .  Pancreatitis   . Cocaine abuse   . Depression   . H/O suicide attempt   . Heart murmur     "when he was little" (03/06/2013)  . Shortness of breath     "can happen at anytime" (03/06/2013)  . Anemia   . H/O hiatal hernia   . GERD (gastroesophageal reflux disease)   . Seizures     "weekly" (03/06/2013)  . Arthritis     "knees" (03/06/2013)  . Anxiety    . WPW (Wolff-Parkinson-White syndrome)     [name] 03/06/2013    reports that he has been smoking Cigarettes.  He has a 30 pack-year smoking history. He uses smokeless tobacco. He reports that he drinks about 25.2 ounces of alcohol per week. He reports that he uses illicit drugs (Cocaine and Marijuana). Family History  Problem Relation Age of Onset  . Hypertension Other   . Coronary artery disease Other      Living Arrangements: Parent   Abuse/Neglect [facility]) Physical Abuse: Denies Verbal Abuse: Denies Sexual Abuse: Denies Allergies:   Allergies  Allergen Reactions  . Trazodone And Nefazodone Other (See Comments)    Muscle spasms  . Shellfish-Derived Products Nausea And Vomiting    ACT Assessment Complete:  No Objective: Blood pressure 121/75, pulse 94, temperature 99.1 F (37.3 C), temperature source Oral, resp. rate 16, height $RemoveBe'5\' 9"'fONpwhhzO$  (1.753 m), weight 59.2 kg (130 lb 8.2 oz), SpO2 100.00%.Body mass index is 19.26 kg/(m^2). Results for orders placed during the hospital encounter of 10/05/13 (from the past 72 hour(s))  COMPREHENSIVE METABOLIC PANEL     Status: Abnormal   Collection Time    10/20/13  8:59 AM      Result Value Ref Range   Sodium 139  137 - 147 mEq/L   Potassium 4.4  3.7 - 5.3 mEq/L   Chloride 102  96 - 112 mEq/L   CO2 25  19 - 32 mEq/L   Glucose, Bld 70  70 - 99 mg/dL   BUN 5 (*) 6 - 23 mg/dL   Creatinine, Ser 0.62  0.50 - 1.35 mg/dL   Calcium 8.6  8.4 - 10.5 mg/dL   Total Protein 6.8  6.0 - 8.3 g/dL   Albumin 2.1 (*) 3.5 - 5.2 g/dL   AST 40 (*) 0 - 37 U/L   ALT 34  0 - 53 U/L   Alkaline Phosphatase 149 (*) 39 - 117 U/L   Total Bilirubin 0.9  0.3 - 1.2 mg/dL   GFR calc non Af Amer >90  >90 mL/min   GFR calc Af Amer >90  >90 mL/min   Comment: (NOTE)     The eGFR has been calculated using the CKD EPI equation.     This calculation has not been validated in all clinical situations.     eGFR's persistently <90 mL/min signify possible Chronic Kidney      Disease.  LIPASE, BLOOD     Status: Abnormal   Collection Time    10/20/13  8:59 AM      Result Value Ref Range   Lipase 175 (*) 11 - 59 U/L   Labs are reviewed and are pertinent for alcohol intoxication.  Current Facility-Administered Medications  Medication Dose Route Frequency Provider Last Rate Last Dose  . acetaminophen (TYLENOL) solution 650 mg  650 mg Oral Q6H PRN Juanito Doom, MD   650 mg at 10/19/13 2026  . albuterol (PROVENTIL) (2.5 MG/3ML) 0.083% nebulizer solution 2.5 mg  2.5 mg Inhalation Q2H  PRN Chesley Mires, MD      . alum & mag hydroxide-simeth (MAALOX/MYLANTA) 200-200-20 MG/5ML suspension 30 mL  30 mL Oral Q4H PRN Melton Alar, PA-C   30 mL at 10/21/13 1254  . benzonatate (TESSALON) capsule 100 mg  100 mg Oral TID PRN [name], MD   100 mg at 10/21/13 0122  . bismuth subsalicylate (PEPTO BISMOL) 262 MG/15ML suspension 30 mL  30 mL Oral Q4H PRN Juanito Doom, MD   30 mL at 10/21/13 1858  . cyclobenzaprine (FLEXERIL) tablet 5 mg  5 mg Oral Daily [name], MD   5 mg at 10/22/13 1037  . enoxaparin (LOVENOX) injection 40 mg  40 mg Subcutaneous Q24H Jonetta Osgood, MD   40 mg at 10/22/13 1249  . feeding supplement (ENSURE COMPLETE) (ENSURE COMPLETE) liquid 237 mL  237 mL Oral BID BM Erlene Quan, RD   237 mL at 10/22/13 1416  . folic acid (FOLVITE) tablet 1 mg  1 mg Oral Daily Darnell Level Mancheril, RPH   1 mg at 10/22/13 1037  . lipase/protease/amylase (CREON-12/PANCREASE) capsule 1 capsule  1 capsule Oral TID WC Melton Alar, PA-C   1 capsule at 10/22/13 1242  . LORazepam (ATIVAN) injection 2 mg  2 mg Intramuscular Once Gardiner Barefoot, NP      . LORazepam (ATIVAN) tablet 1 mg  1 mg Oral TID PRN Melton Alar, PA-C   1 mg at 10/22/13 0804  . menthol-cetylpyridinium (CEPACOL) lozenge 3 mg  1 lozenge Oral PRN [name], MD   3 mg at 10/21/13 0808  . metoprolol (LOPRESSOR) injection 2.5 mg  2.5 mg Intravenous Q5 min PRN Melton Alar, PA-C      . metoprolol (LOPRESSOR) tablet 50 mg  50 mg Oral BID Melton Alar, PA-C   50 mg at 10/22/13 1040  . multivitamin with minerals tablet 1 tablet  1 tablet Oral Daily Raylene Miyamoto, MD   1 tablet at 10/22/13 1037  . oxyCODONE (Oxy IR/ROXICODONE) immediate release tablet 5 mg  5 mg Oral Q6H PRN [name], MD   5 mg at 10/22/13 1249  . pantoprazole (PROTONIX) EC tablet 40 mg  40 mg Oral QHS Darnell Level Mancheril, RPH   40 mg at 10/21/13 2107  . QUEtiapine (SEROQUEL) tablet 100 mg  100 mg Oral QHS Durward Parcel, MD   100 mg at 10/21/13 2107  . QUEtiapine (SEROQUEL) tablet 25 mg  25 mg Oral BID AC [name], MD   25 mg at 10/22/13 0804  . sodium chloride 0.9 % injection 3 mL  3 mL Intravenous Q12H Marianne L York, PA-C   3 mL at 10/22/13 1037  . sodium chloride 0.9 % injection 3 mL  3 mL Intravenous PRN Melton Alar, PA-C      . thiamine (VITAMIN B-1) tablet 100 mg  100 mg Oral Daily Darnell Level Mancheril, RPH   100 mg at 10/22/13 1037    Psychiatric Specialty Exam: Physical Exam  ROS  Blood pressure 121/75, pulse 94, temperature 99.1 F (37.3 C), temperature source Oral, resp. rate 16, height $RemoveBe'5\' 9"'GKfAPhjzq$  (1.753 m), weight 59.2 kg (130 lb 8.2 oz), SpO2 100.00%.Body mass index is 19.26 kg/(m^2).  General Appearance: Disheveled and Guarded  Eye Contact::  Fair  Speech:  Clear and Coherent and Slow  Volume:  Decreased  Mood:  Anxious and Depressed  Affect:  Depressed and Tearful  Thought  Process:  Coherent and Goal Directed  Orientation:  Full (Time, Place, and Person)  Thought Content:  Rumination  Suicidal Thoughts:  Yes.  with intent/plan  Homicidal Thoughts:  No  Memory:  Immediate;   Fair Recent;   Fair  Judgement:  Intact  Insight:  Fair  Psychomotor Activity:  Decreased  Concentration:  Fair  Recall:  [employer] of Knowledge:Fair  Language: Fair  Akathisia:  NA  Handed:  Right  AIMS (if indicated):     Assets:  Communication  Skills Desire for Improvement Housing Intimacy Leisure Time Resilience Social Support  Sleep:      Musculoskeletal: Strength & Muscle Tone: within normal limits Gait & Station: unable to stand Patient leans: N/A  Treatment Plan Summary: Daily contact with patient to assess and evaluate symptoms and progress in treatment Medication management  Anetria Harwick,JANARDHAHA R. 10/22/2013 3:46 PM

## 2013-10-22 NOTE — Progress Notes (Signed)
PATIENT DETAILS Name: CHRISOPHER PUSTEJOVSKY Age: 43 y.o. Sex: male Date of Birth: Feb 16, 1971 Admit Date: 10/05/2013 Admitting Physician Phillips Grout, MD ASN:[id number], Gabrielle Dare, MD    Stable and ready for D/C to [facility] when bed is available.   Subjective: Slight abdominal pain after eating pudding on 6/15.  Other wise no complaints.  Very pleasant.  Assessment/Plan: Suicide attempt  -intentional overdose of flexeril, clonidine.  -Psychiatry was consulted and followed the patient daily making appropriate medication changes.  -Sitter remained at bedside. Suicide precautions were implemented  -The patient was involuntarily committed and will be transferred to behavioral health at discharge   Sinus tachycardia  -Chronic sinus tachycardia, going back in his chart has been present for at least 6 months, seen by electrophysiologist Dr.Croitoru, recommended supportive care only. Beta blocker for hypertension to be continued. He is symptom free.  Acute on chronic Pancreatitis  -Has been attributed to alcohol abuse.  Now resolved.  Occasionally has pain after eating.  Will start pancrease supplementation and continue low fat diet (requested small portions, 4-6 meals per day).  Acute encephalopathy secondary to alcohol abuse  -Due to DTs, initially required placement in ICU. Now resolved. Continue folic acid and thiamine supplementation. As needed Ativan   Protein-calorie malnutrition, severe  -Protein supplements were recommended by nutrition and added to his diet   Mild leukocytosis  -Nonspecific, chest x-ray UA unremarkable, could be secondary to mild pancreatitis. We'll monitor closely.   Disposition: Remain inpatient transfer to [place] when bed available.  DVT Prophylaxis: Prophylactic Lovenox   Code Status: Full code   Family Communication Mother at bedside  Procedures:  None  CONSULTS:  psychiatry  MEDICATIONS: Scheduled Meds: . cyclobenzaprine  5 mg Oral  Daily  . enoxaparin (LOVENOX) injection  40 mg Subcutaneous Q24H  . feeding supplement (ENSURE COMPLETE)  237 mL Oral BID BM  . folic acid  1 mg Oral Daily  . lipase/protease/amylase  1 capsule Oral TID WC  . LORazepam  2 mg Intramuscular Once  . metoprolol tartrate  50 mg Oral BID  . multivitamin with minerals  1 tablet Oral Daily  . pantoprazole  40 mg Oral QHS  . QUEtiapine  100 mg Oral QHS  . QUEtiapine  25 mg Oral BID AC  . sodium chloride  3 mL Intravenous Q12H  . thiamine  100 mg Oral Daily   Continuous Infusions:  PRN Meds:.acetaminophen (TYLENOL) oral liquid 160 mg/5 mL, albuterol, alum & mag hydroxide-simeth, benzonatate, bismuth subsalicylate, LORazepam, menthol-cetylpyridinium, metoprolol, oxyCODONE, sodium chloride  Antibiotics: Anti-infectives   None       PHYSICAL EXAM: Vital signs in last 24 hours: Filed Vitals:   10/21/13 1300 10/21/13 2042 10/22/13 0539 10/22/13 1040  BP: 110/70 109/65 99/66 132/69  Pulse: 84 96 84 92  Temp: 99.1 F (37.3 C) 98.3 F (36.8 C) 98.3 F (36.8 C)   TempSrc: Oral Oral Oral   Resp: 16 16 16    Height:      Weight:      SpO2: 100% 100% 100%     Weight change:  Filed Weights   10/05/13 2302 10/06/13 0002 10/06/13 0345  Weight: 66.679 kg (147 lb) 79.379 kg (175 lb) 59.2 kg (130 lb 8.2 oz)   Body mass index is 19.26 kg/(m^2).   Gen Exam: Awake and alert with clear speech.  In bed.  NAD, sitter at bedside Neck: Supple, No JVD.   Chest: B/L Clear.   CVS:  S1 S2 Regular, no murmurs.  Abdomen: soft, BS +, non tender., non distended. No masses Extremities: no edema, lower extremities warm to touch. Neurologic: Non Focal.   Skin: No Rash.   Psych:  Seems clear and coherent today.  Talking about desire to attend AA meetings.  Intake/Output from previous day:  Intake/Output Summary (Last 24 hours) at 10/22/13 1104 Last data filed at 10/22/13 0900  Gross per 24 hour  Intake   1103 ml  Output      0 ml  Net   1103 ml      LAB RESULTS: CBC  Recent Labs Lab 10/17/13 0635 10/19/13 0825  WBC 12.9* 13.4*  HGB 7.8* 8.5*  HCT 24.9* 27.4*  PLT 206 285  MCV 99.6 100.7*  MCH 31.2 31.3  MCHC 31.3 31.0  RDW 19.4* 19.1*    Chemistries   Recent Labs Lab 10/16/13 0652 10/17/13 0635 10/19/13 0825 10/20/13 0859  NA 141 140 136* 139  K 4.4 4.2 4.5 4.4  CL 106 105 98 102  CO2 22 23 26 25   GLUCOSE 86 109* 91 70  BUN 3* 4* 4* 5*  CREATININE 0.61 0.64 0.56 0.62  CALCIUM 8.8 8.7 9.1 8.6  MG 1.8  --   --   --     Recent Labs  10/20/13 0859  LIPASE 175*     RADIOLOGY STUDIES/RESULTS: Dg Chest 2 View  10/17/2013   CLINICAL DATA:  Cough  EXAM: CHEST  2 VIEW  COMPARISON:  10/12/2013  FINDINGS: Cardiac silhouette is normal in size. Normal mediastinal and hilar contours.  Lungs are clear. Previously seen interstitial airspace opacities have resolved.  No pleural effusion or pneumothorax.  Bony thorax is intact.  IMPRESSION: No active cardiopulmonary disease.   Electronically Signed   By: Lajean Manes M.D.   On: 10/17/2013 08:24   US Abdomen Complete  10/19/2013   CLINICAL DATA:  Elevated LFTs and abdominal pain  EXAM: ULTRASOUND ABDOMEN COMPLETE  COMPARISON:  08/17/2013  FINDINGS: Gallbladder:  No gallstones or wall thickening visualized. No sonographic Murphy sign noted.  Common bile duct:  Diameter: 2.8 mm  Liver:  No focal lesion identified. Within normal limits in parenchymal echogenicity.  IVC:  No abnormality visualized.  Pancreas:  There is a hypoechoic nodule along the ventral surface of the pancreatic body measuring 1.2 x 0.6 x 1.0 cm.  Spleen:  Size and appearance within normal limits.  Right Kidney:  Length: 11.6 cm. Echogenicity within normal limits. No mass or hydronephrosis visualized.  Left Kidney:  Length: 11.4 cm. Echogenicity within normal limits. No mass or hydronephrosis visualized.  Abdominal aorta:  No aneurysm visualized.  Other findings:  None.  IMPRESSION: 1. No acute findings. 2.  Indeterminate hypoechoic nodule along the ventral surface of the pancreatic body. Not seen on previous CT of the upper abdomen. Recommend nonemergent, followup imaging with pancreas protocol MRI or CT.   Electronically Signed   By: Kerby Moors M.D.   On: 10/19/2013 09:43   Dg Chest Port 1 View  10/12/2013   CLINICAL DATA:  Pneumonia.  EXAM: PORTABLE CHEST - 1 VIEW  COMPARISON:  10/08/2013  FINDINGS: The cardiac silhouette, mediastinal and hilar contours are stable. Persistent but improved bilateral interstitial and airspace process likely atypical pneumonia. No pleural effusion or pneumothorax.  IMPRESSION: Persistent but improved bilateral interstitial and airspace process.   Electronically Signed   By: Kalman Jewels M.D.   On: 10/12/2013 07:02   Dg Chest [facility] [address]  10/08/2013   CLINICAL DATA:  Assess infiltrates.  EXAM: PORTABLE CHEST - 1 VIEW  COMPARISON:  10/06/2013.  FINDINGS: Bilateral irregular interstitial thickening appears increased from the prior study, although this may be in part due to lower lung volumes and a more semi-erect positioning.  No pleural effusion. No pneumothorax. Cardiac silhouette is normal in size. Normal mediastinal and hilar contours.  IMPRESSION: Increased interstitial thickening. Diffuse bilateral interstitial infiltrates versus interstitial edema. Degree of interstitial thickening has increased when compared the prior study.   Electronically Signed   By: Lajean Manes M.D.   On: 10/08/2013 20:35   Dg Chest Portable 1 View  10/06/2013   CLINICAL DATA:  Drug overdose  EXAM: PORTABLE CHEST - 1 VIEW  COMPARISON:  08/16/2013  FINDINGS: There is interstitial coarsening and mild hypoaeration. Normal heart size and mediastinal contours. No effusion or pneumothorax.  IMPRESSION: Diffuse interstitial prominence, likely from hypoaeration. Cannot exclude mild aspiration pneumonitis.   Electronically Signed   By: Jorje Guild M.D.   On: 10/06/2013 00:56   Dg Abd Portable  1v  10/14/2013   CLINICAL DATA:  Mid to left abdominal pain.  EXAM: PORTABLE ABDOMEN - 1 VIEW  COMPARISON:  10/06/2013  FINDINGS: Bowel gas pattern is nonobstructive. No organomegaly or abnormal calcifications identified. Visualized osseous structures have a normal appearance. Patient is rotated.  IMPRESSION: No evidence for acute  abnormality.   Electronically Signed   By: Shon Hale M.D.   On: 10/14/2013 10:01   Dg Abd Portable 1v  10/06/2013   CLINICAL DATA:  43 year old male drug overdose. Initial encounter.  EXAM: PORTABLE ABDOMEN - 1 VIEW  COMPARISON:  CT Abdomen and Pelvis 4 levin 2014.  FINDINGS: Portable AP supine view at 0046 hrs. Non obstructed bowel gas pattern. Stable right hemipelvis phlebolith. Evidence of a healed posterior right tenth rib fracture. No acute osseous abnormality identified. No definite pneumoperitoneum on this supine view.  IMPRESSION: Non obstructed bowel gas pattern.   Electronically Signed   By: Lars Pinks M.D.   On: 10/06/2013 00:56    Karen Kitchens  [facility] Pager:[phone]   If 7PM-7AM, please contact night-coverage [email or web address] Password [email or web address] 10/22/2013, 11:04 AM   LOS: 17 days   **Disclaimer: This note may have been dictated with voice recognition software. Similar sounding words can inadvertently be transcribed and this note may contain transcription errors which may not have been corrected upon publication of note.**   Attending Patient was seen, examined,treatment plan was discussed with the Physician extender. I have directly reviewed the clinical findings, lab, imaging studies and management of this patient in detail. I have made the necessary changes to the above noted documentation, and agree with the documentation, as recorded by the Physician extender.  Nena Alexander MD [facility].

## 2013-10-22 NOTE — Progress Notes (Signed)
Pt transferred to [facility] by police escort. Pt A&Ox4. Skin c/d/i. Pt allowed to get dressed prior to transfer - suicide sitter at bedside. Room checked at the beginning of the shift - paper filled out and placed in chart. IV removed - skin c/d/i. Report called to receiving nurse. Previous VS stable. Dayshift charge nurse state the patient's mother picked up his items from security. Pt previously medicated with pain medicine by dayshift nurse.

## 2013-10-22 NOTE — Clinical Social Work Psych Note (Addendum)
11:45am- psych CSW contacted [facility] re: possible admission. [facility] is not expecting dc's.  Pt is #3 on the waitlist.  Psych CSW made psychiatrist aware.    11:06am- [facility] under [facility] at [facility] and on [facility] waitlist.  Medical Director and Assistant CSW Director aware and are currently assisting.  [facility] reviewed pt and feels that pt is more appropriate for inpatient psych placement due to hx of suicide attempts and hospitalization due to suicide attempt.  [facility] feels the pt is too psychiatric acute and at this time has been denied.   Nonnie Done, [place] [phone]  Clinical Social Work

## 2013-10-23 DIAGNOSIS — F1020 Alcohol dependence, uncomplicated: Secondary | ICD-10-CM | POA: Diagnosis present

## 2013-10-23 DIAGNOSIS — F1994 Other psychoactive substance use, unspecified with psychoactive substance-induced mood disorder: Secondary | ICD-10-CM

## 2013-10-23 LAB — LIPASE, BLOOD: Lipase: 451 U/L — ABNORMAL HIGH (ref 11–59)

## 2013-10-23 MED ORDER — LORAZEPAM 1 MG PO TABS
1.0000 mg | ORAL_TABLET | Freq: Three times a day (TID) | ORAL | Status: DC | PRN
  Administered 2013-10-23 – 2013-10-25 (×4): 1 mg via ORAL
  Filled 2013-10-23 (×4): qty 1
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
## 2013-10-23 NOTE — Progress Notes (Signed)
D Pt. Denies SI and HI, does rate his depression at an  And is having abdominal pain.  A Writer offered support and encouragement, adm pain medication  R Pt. Remains safe on the unit, pain is reduced with the medication.

## 2013-10-23 NOTE — Progress Notes (Addendum)
NUTRITION ASSESSMENT  Pt identified as at risk on the Malnutrition Screen Tool Patient meets criteria for severe MALNUTRITION in the context of chronic illness as evidenced by severe depletion of muscle mass and 14% weight loss in the past 2 months.  INTERVENTION: 1. Educated patient on the importance of nutrition and encouraged intake of food and beverages.  Provided "Pancreatis Diet Therapy" handout.  Teach back method used.  Patient needs to avoid alcohol and follow a low fat diet. 2. Discussed weight goals. 3. Supplements: Ensure Complete po BID, each supplement provides 350 kcal and 13 grams of protein Patient is receiving lactulose and creon as well.   4.  Diet changed to Heart Healthy  NUTRITION DIAGNOSIS: Unintentional weight loss related to sub-optimal intake as evidenced by pt report.   Goal: Pt to meet >/= 90% of their estimated nutrition needs.  Monitor:  PO intake  Assessment:  Patient admitted from [facility] secondary to OD.  Hx includes pancreatitis.  Patient reports pain after meals and after coffee today.  States that his diet has been variable since hospital admit on acute side.  Likes Ensure.  43 y.o. male  Height: Ht Readings from Last 1 Encounters:  10/22/13 5\' 9"  (1.753 m)    Weight: Wt Readings from Last 1 Encounters:  10/22/13 126 lb (57.153 kg)    Weight Hx: Wt Readings from Last 10 Encounters:  10/22/13 126 lb (57.153 kg)  10/06/13 130 lb 8.2 oz (59.2 kg)  09/13/13 147 lb (66.679 kg)  08/16/13 147 lb (66.679 kg)  08/08/13 147 lb (66.679 kg)  04/22/13 145 lb (65.772 kg)  03/20/13 151 lb 3.2 oz (68.584 kg)  03/16/13 143 lb (64.864 kg)  03/07/13 142 lb 3.2 oz (64.5 kg)  03/07/13 142 lb 3.2 oz (64.5 kg)    BMI:  Body mass index is 18.6 kg/(m^2). Pt meets criteria for near underweight based on current BMI.  Estimated Nutritional Needs: Kcal: 25-30 kcal/kg Protein: > 1 gram protein/kg Fluid: 1 ml/kcal  Diet Order: Sodium Restricted Pt is also  offered choice of unit snacks mid-morning and mid-afternoon.  Pt is eating as desired.   Lab results and medications reviewed.   Antonieta Iba, RD, LDN Clinical Inpatient Dietitian Pager:  [phone] Weekend and after hours pager:  [phone]

## 2013-10-23 NOTE — Progress Notes (Signed)
D:Pt rates his depression as an 8 on 1-10 scale with 10 being the most depressed. He c/o anxiety and lower abdominal pain. Pt also reports that he has had a non productive cough with yellow sputum.  A:Offered support, encouragement and 15 minute checks. Gave prn medication as ordered. No new orders at this time. R:Pt denies si and hi. Safety maintained on the unit.

## 2013-10-23 NOTE — BHH Group Notes (Signed)
Pt attended the NA group tonight.
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
## 2013-10-23 NOTE — Progress Notes (Signed)
Patient ID: [name], male   DOB: 1971/01/31, 43 y.o.   MRN: [id number]  Pt wanted Dr. Sabra Heck and Nira Conn, SW to know that his mother has agreed to let him stay with her upon discharge. [name] was at dinner and she confirmed this information.

## 2013-10-23 NOTE — Progress Notes (Signed)
Adult Psychoeducational Group Note  Date:  10/23/2013 Time:  4:37 PM  Group Topic/Focus:  Personal Choices and Values:   The focus of this group is to help patients assess and explore the importance of values in their lives, how their values affect their decisions, how they express their values and what opposes their expression.  Participation Level:  Active  Participation Quality:  Attentive  Affect:  Appropriate  Cognitive:  Appropriate  Engagement in Group:  Engaged  Modes of Intervention:  Discussion and Support  Additional Comments:  When asked what brought him here, pt shared in detail what brought him here.  [name] 10/23/2013, 4:37 PM

## 2013-10-23 NOTE — BHH Group Notes (Signed)
[facility] LCSW Group Therapy  10/23/2013 3:34 PM  Type of Therapy:  Group Therapy  Participation Level:  Did Not Attend-pt walked in as group concluded.   Smart, Heather LCSWA  10/23/2013, 3:34 PM

## 2013-10-23 NOTE — BHH Group Notes (Signed)
[facility] LCSW Aftercare Discharge Planning Group Note   10/23/2013 10:29 AM  Participation Quality:  Appropriate   Mood/Affect:  Depressed and Flat  Depression Rating:  8  Anxiety Rating:  2  Thoughts of Suicide:  No Will you contract for safety?   NA  Current AVH:  No  Plan for Discharge/Comments:  Pt reports that he was at [facility] for 16 days due to suicide attempt prior to IVC admission to [facility]. Pt stated his primary triggers involve not being able to see one year old son and current gf threatening to have an abortion.   Transportation Means: unknown at this time.   Supports: family supports/mother  Smart, Research officer, trade union

## 2013-10-23 NOTE — BHH Suicide Risk Assessment (Signed)
Suicide Risk Assessment  Admission Assessment     Nursing information obtained from:  Patient Demographic factors:  Male;Low socioeconomic status;Unemployed Current Mental Status:  Suicidal ideation indicated by patient Loss Factors:  Loss of significant relationship;Decline in physical health;Financial problems / change in socioeconomic status Historical Factors:  Family history of mental illness or substance abuse Risk Reduction Factors:  Living with another person, especially a relative;Positive social support Total Time spent with patient: 45 minutes  CLINICAL FACTORS:   Depression:   Comorbid alcohol abuse/dependence Alcohol/Substance Abuse/Dependencies  Psychiatric Specialty Exam: COGNITIVE FEATURES THAT CONTRIBUTE TO RISK:  Closed-mindedness Polarized thinking Thought constriction (tunnel vision)    SUICIDE RISK:   Moderate:  Frequent suicidal ideation with limited intensity, and duration, some specificity in terms of plans, no associated intent, good self-control, limited dysphoria/symptomatology, some risk factors present, and identifiable protective factors, including available and accessible social support.  PLAN OF CARE: Supportive approach/coping skills/relapse prevention                               Optimize response to psychotropics  I certify that inpatient services furnished can reasonably be expected to improve the patient's condition.  LUGO,IRVING A 10/23/2013, 5:22 PM
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
## 2013-10-24 MED ORDER — MENTHOL 3 MG MT LOZG: 1.0000 | LOZENGE | OROMUCOSAL | Status: DC | PRN

## 2013-10-24 MED ORDER — SALINE SPRAY 0.65 % NA SOLN: 1.0000 | NASAL | Status: DC | PRN

## 2013-10-24 NOTE — Progress Notes (Signed)
Patient ID: [name], male   DOB: 1971/05/09, 43 y.o.   MRN: [id number] He has been up and to groups interacting with peers and staff has been pleasant and cooperative today. Has requested and received prn  Medication for c/o chronic left flank pain that was helpful. Self inventory: depression  And hopelessness at 8, withdrawals of craving and agitation. Denies SI thoughts.

## 2013-10-24 NOTE — Progress Notes (Signed)
Referral faxed to [facility]. Awaiting call back

## 2013-10-24 NOTE — [facility] Group Notes (Signed)
0900 nursing orientation group    The focus of this group is to educate the patient on the purpose and policies of crisis stabilization and provide a format to answer questions about their admission.  The group details unit policies and expectations of patients while admitted.   Pt did not attend group he was in bed asleep.

## 2013-10-24 NOTE — Progress Notes (Signed)
Adult Activity Group Note  Date: 10/24/2013 Time:2:45p  Group Topic: Art  Participation Level: Minimal  Participation Quality: Drowsy and Resistant  Affect:  Flat and facial grimacing at times  Activity: Patients asked to create art to coincide with daily unit theme using any combination of markers, crayons, color pencils, construction paper, magazine clippings, glue, and scissors.   Additional Comments: Pt appeared uncomfortable and a little irritable

## 2013-10-24 NOTE — Progress Notes (Signed)
[facility] MD Progress Note  10/24/2013 6:00 PM TICO CROTTEAU  MRN:  [id number] Subjective:  Neamiah states he is still trying to get his life back together. He is worried about what is going to happen once his GF comes to [place]. Her due date is in October. He sates he has to find a job and be able to provide for this child. He is committed to abstinence. States he wants to go to a residential treatment center. Diagnosis:   DSM5: Schizophrenia Disorders:  none Obsessive-Compulsive Disorders:  none Trauma-Stressor Disorders:  none Substance/Addictive Disorders:  Alcohol Related Disorder - Severe (303.90) Depressive Disorders:  Major Depressive Disorder - Severe (296.23) Total Time spent with patient: 30 minutes  Axis I: Substance Induced Mood Disorder  ADL's:  Intact  Sleep: Poor  Appetite:  Fair  Suicidal Ideation:  Plan:  denies Intent:  denies Means:  denies Homicidal Ideation:  Plan:  denies Intent:  denies Means:  denies AEB (as evidenced by):  Psychiatric Specialty Exam: Physical Exam  Review of Systems  Constitutional: Negative.   HENT: Positive for congestion.   Eyes: Negative.   Respiratory: Positive for cough.   Cardiovascular: Negative.   Gastrointestinal: Negative.   Genitourinary: Negative.   Musculoskeletal: Negative.   Skin: Negative.   Neurological: Negative.   Endo/Heme/Allergies: Negative.   Psychiatric/Behavioral: Positive for depression and substance abuse. The patient is nervous/anxious and has insomnia.     Blood pressure 104/72, pulse 89, temperature 98.5 F (36.9 C), temperature source Oral, resp. rate 18, height 5\' 9"  (1.753 m), weight 57.153 kg (126 lb), SpO2 98.00%.Body mass index is 18.6 kg/(m^2).  General Appearance: Fairly Groomed  Engineer, water::  Fair  Speech:  Clear and Coherent, Slow and not spontaneous  Volume:  Decreased  Mood:  Depressed  Affect:  Depressed  Thought Process:  Coherent and Goal Directed  Orientation:  Full  (Time, Place, and Person)  Thought Content:  symptoms, worries, concerns  Suicidal Thoughts:  No  Homicidal Thoughts:  No  Memory:  Immediate;   Fair Recent;   Fair Remote;   Fair  Judgement:  Fair  Insight:  Present  Psychomotor Activity:  Decreased  Concentration:  Fair  Recall:  [employer] of Knowledge:NA  Language: Fair  Akathisia:  No  Handed:    AIMS (if indicated):     Assets:  Desire for Improvement Social Support  Sleep:  Number of Hours: 4   Musculoskeletal: Strength & Muscle Tone: within normal limits Gait & Station: normal Patient leans: N/A  Current Medications: Current Facility-Administered Medications  Medication Dose Route Frequency Provider Last Rate Last Dose  . albuterol (PROVENTIL HFA;VENTOLIN HFA) 108 (90 BASE) MCG/ACT inhaler 1-2 puff  1-2 puff Inhalation Q6H PRN Laverle Hobby, PA-C      . alum & mag hydroxide-simeth (MAALOX/MYLANTA) 200-200-20 MG/5ML suspension 30 mL  30 mL Oral Q4H PRN Laverle Hobby, PA-C   30 mL at 10/24/13 1709  . feeding supplement (ENSURE COMPLETE) (ENSURE COMPLETE) liquid 237 mL  237 mL Oral BID BM Maurine Minister Simon, PA-C   237 mL at 10/24/13 1430  . lactulose (CHRONULAC) 10 GM/15ML solution 10 g  10 g Oral Daily Laverle Hobby, PA-C   10 g at 10/24/13 0816  . lipase/protease/amylase (CREON-12/PANCREASE) capsule 1 capsule  1 capsule Oral TID WC Laverle Hobby, PA-C   1 capsule at 10/24/13 1641  . LORazepam (ATIVAN) tablet 1 mg  1 mg Oral Q8H PRN Nicholaus Bloom,  MD   1 mg at 10/24/13 0821  . magnesium hydroxide (MILK OF MAGNESIA) suspension 30 mL  30 mL Oral Daily PRN Laverle Hobby, PA-C      . menthol-cetylpyridinium (CEPACOL) lozenge 3 mg  1 lozenge Oral PRN Nicholaus Bloom, MD      . metoprolol (LOPRESSOR) tablet 50 mg  50 mg Oral BID Laverle Hobby, PA-C   50 mg at 10/24/13 1641  . nicotine (NICODERM CQ - dosed in mg/24 hours) patch 14 mg  14 mg Transdermal Daily Laverle Hobby, PA-C   14 mg at 10/24/13 0815  . ondansetron  (ZOFRAN-ODT) disintegrating tablet 4 mg  4 mg Oral Q8H PRN Laverle Hobby, PA-C      . oxyCODONE-acetaminophen (PERCOCET/ROXICET) 5-325 MG per tablet 1-2 tablet  1-2 tablet Oral Q4H PRN Laverle Hobby, PA-C   2 tablet at 10/24/13 1642  . pantoprazole (PROTONIX) EC tablet 40 mg  40 mg Oral QHS Laverle Hobby, PA-C   40 mg at 10/23/13 2159  . QUEtiapine (SEROQUEL) tablet 100 mg  100 mg Oral QHS Laverle Hobby, PA-C   100 mg at 10/23/13 2159  . QUEtiapine (SEROQUEL) tablet 25 mg  25 mg Oral BID AC Spencer E Simon, PA-C   25 mg at 10/24/13 1642  . sodium chloride (OCEAN) 0.65 % nasal spray 1 spray  1 spray Each Nare PRN Nicholaus Bloom, MD        Lab Results:  Results for orders placed during the hospital encounter of 10/22/13 (from the past 48 hour(s))  LIPASE, BLOOD     Status: Abnormal   Collection Time    10/23/13  7:23 PM      Result Value Ref Range   Lipase 451 (*) 11 - 59 U/L   Comment: Performed at [facility]    Physical Findings: AIMS: Facial and Oral Movements Muscles of Facial Expression: None, normal Lips and Perioral Area: None, normal Jaw: None, normal Tongue: None, normal,Extremity Movements Upper (arms, wrists, hands, fingers): None, normal Lower (legs, knees, ankles, toes): None, normal, Trunk Movements Neck, shoulders, hips: None, normal, Overall Severity Severity of abnormal movements (highest score from questions above): None, normal Incapacitation due to abnormal movements: None, normal Patient's awareness of abnormal movements (rate only patient's report): No Awareness, Dental Status Current problems with teeth and/or dentures?: No Does patient usually wear dentures?: No  CIWA:    COWS:     Treatment Plan Summary: Daily contact with patient to assess and evaluate symptoms and progress in treatment Medication management  Plan: Supportive approach/coping skills/relapse prevention           Reassess and optimize response to psychotropics            Address the nasal congestion           Facilitate admission to a residential treatment center Medical Decision Making Problem Points:  Review of psycho-social stressors (1) Data Points:  Review of medication regiment & side effects (2)  I certify that inpatient services furnished can reasonably be expected to improve the patient's condition.   LUGO,IRVING A 10/24/2013, 6:00 PM
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
## 2013-10-24 NOTE — Progress Notes (Signed)
Chaplain Note: Patient requested prayer for a recently dissolved relationship. Upon entering the room, patient seemed as though he had been waiting to speak with someone. During our conversation, he seemed very contemplative and reflective on past experiences in his relationship. He says that because there is a possibility of him being a father, he wants to prepare himself so that he can support and provide for his child and the child's mother. He also stated that he would like to spend more time focusing on himself and what he really wants out of life and from this relationship.  Garnette Scheuermann, [place]

## 2013-10-24 NOTE — [facility] Group Notes (Signed)
[facility] LCSW Group Therapy  10/24/2013 3:05 PM  Type of Therapy:  Group Therapy  Participation Level:  Minimal  Participation Quality:  Attentive  Affect:  Depressed  Cognitive:  Alert and Oriented  Insight:  Improving  Engagement in Therapy:  Limited  Modes of Intervention:  Clarification, Discussion, Exploration, Problem-solving and Support  Summary of Progress/Problems: Finding Balance in Life. Today's group focused on defining balance in one's own words, identifying things that can knock one off balance, and exploring healthy ways to maintain balance in life. Group members were asked to provide an example of a time when they felt off balance, describe how they handled that situation,and process healthier ways to regain balance in the future. Group members were asked to share the most important tool for maintaining balance that they learned while at [facility] and how they plan to apply this method after discharge.   [name] provided minimal engagement within group. He was attentive to his peers and often nodded his head in agreement with his peers. He did not provide therapeutic contributions to the group discussion but he did state how a variety of barriers can prevent one from regaining balance in the future.   [name], Jason C 10/24/2013, 3:05 PM

## 2013-10-24 NOTE — Progress Notes (Signed)
CSW left voicemail for patient's parole officer Kirtland Bouchard [phone]. Awaiting return phone call if parole officer has any questions.

## 2013-10-25 DIAGNOSIS — F1020 Alcohol dependence, uncomplicated: Secondary | ICD-10-CM

## 2013-10-25 DIAGNOSIS — F332 Major depressive disorder, recurrent severe without psychotic features: Principal | ICD-10-CM

## 2013-10-25 MED ORDER — QUETIAPINE FUMARATE 25 MG PO TABS: 25.0000 mg | ORAL_TABLET | Freq: Two times a day (BID) | ORAL | Status: DC

## 2013-10-25 MED ORDER — NICOTINE 14 MG/24HR TD PT24: 14.0000 mg | MEDICATED_PATCH | Freq: Every day | TRANSDERMAL | Status: DC

## 2013-10-25 MED ORDER — PANCRELIPASE (LIP-PROT-AMYL) 12000-38000 UNITS PO CPEP: 1.0000 | ORAL_CAPSULE | Freq: Three times a day (TID) | ORAL | Status: DC

## 2013-10-25 MED ORDER — QUETIAPINE FUMARATE 100 MG PO TABS: 100.0000 mg | ORAL_TABLET | Freq: Every day | ORAL | Status: DC

## 2013-10-25 MED ORDER — OXYCODONE-ACETAMINOPHEN 5-325 MG PO TABS: 2.0000 | ORAL_TABLET | Freq: Four times a day (QID) | ORAL | Status: DC | PRN

## 2013-10-25 MED ORDER — METOPROLOL TARTRATE 50 MG PO TABS: 50.0000 mg | ORAL_TABLET | Freq: Two times a day (BID) | ORAL | Status: DC

## 2013-10-25 MED ORDER — LACTULOSE 10 GM/15ML PO SOLN: 10.0000 g | Freq: Every day | ORAL | Status: DC

## 2013-10-25 MED ORDER — LANSOPRAZOLE 15 MG PO CPDR: 15.0000 mg | DELAYED_RELEASE_CAPSULE | Freq: Two times a day (BID) | ORAL | Status: DC | PRN

## 2013-10-25 MED ORDER — LORAZEPAM 1 MG PO TABS: ORAL_TABLET | ORAL | Status: DC

## 2013-10-25 MED ORDER — ONDANSETRON 4 MG PO TBDP: 4.0000 mg | ORAL_TABLET | Freq: Three times a day (TID) | ORAL | Status: DC | PRN

## 2013-10-25 MED ORDER — ALBUTEROL SULFATE HFA 108 (90 BASE) MCG/ACT IN AERS: 1.0000 | INHALATION_SPRAY | Freq: Four times a day (QID) | RESPIRATORY_TRACT | Status: DC | PRN
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
## 2013-10-25 NOTE — Progress Notes (Signed)
Patient ID: [name], male   DOB: 1970/06/10, 43 y.o.   MRN: [id number] Patient discharged per physician order; patient denies SI/HI and A/V hallucinations; patient received samples, prescriptions, and copy of AVS after it was reviewed; patient had no other questions or concerns at this time; patient verbalized and signed that he received all belongings; patient left the unit ambulatory
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
## 2013-10-25 NOTE — Progress Notes (Signed)
D: Took over patient's care @ 0330. Patient in bed sleeping. Respiration regular and unlabored. No sign of distress noted at this time A: 15 mins checks for safety. R: Patient is safe.

## 2013-10-25 NOTE — B[facility] Group Notes (Signed)
[facility] LCSW Aftercare Discharge Planning Group Note   10/25/2013 10:25 AM  Participation Quality:  Active   Mood/Affect:  Appropriate  Depression Rating:  10   Anxiety Rating:  10  Thoughts of Suicide:  No Will you contract for safety?   Yes  Current AVH:  No  Plan for Discharge/Comments:  Patient to follow up with [facility] for outpatient services upon discharge.    Transportation Means: Bus pass   Supports: Mother   [place], [place] C

## 2013-10-25 NOTE — Progress Notes (Addendum)
[facility] Adult Case Management Discharge Plan :  Will you be returning to the same living situation after discharge: Yes,  mother's residence At discharge, do you have transportation home?:Yes,  bus pass Do you have the ability to pay for your medications:Yes,  no barriers  Release of information consent forms completed and submitted by CSW to medical records  Patient to Follow up at: Follow-up Information   Follow up with [facility]. (Walk in between 8am-9am Monday through Friday for hospital followup/medication management/assessment for therapy services. )    Contact information:   [address], [place] [address] Phone: [phone] Fax: [phone]      Patient denies SI/HI:   Yes,  patient denies    Safety Planning and Suicide Prevention discussed:  Yes,  with patient  Harriet Masson 10/25/2013, 10:49 AM

## 2013-10-25 NOTE — Tx Team (Signed)
Interdisciplinary Treatment Plan Update (Adult)  Date: 10/25/2013  Time Reviewed:  9:45 AM  Progress in Treatment: Attending groups: Yes Participating in groups:  Yes Taking medication as prescribed:  Yes Tolerating medication:  Yes Family/Significant othe contact made: CSW assessing Patient understands diagnosis:  Yes Discussing patient identified problems/goals with staff:  Yes Medical problems stabilized or resolved:  Yes Denies suicidal/homicidal ideation: Yes Issues/concerns per patient self-inventory:  Yes Other:  New problem(s) identified: N/A  Discharge Plan or Barriers: CSW assessing for appropriate referrals.    Reason for Continuation of Hospitalization: Anxiety Depression Medication Stabilization  Comments: N/A  Estimated length of stay: 3-5 days  For review of initial/current patient goals, please see plan of care.  Attendees: Patient:     Family:     Physician:  Dr. Adele Schilder 10/25/2013 9:40 AM  Nursing:   Joelene Millin, RN 10/25/2013 9:40 AM  Clinical Social Worker: Boyce Medici., LCSW 10/25/2013 9:40 AM  Other: Regan Lemming, LCSW 10/25/2013 9:40 AM  Other:  Boyce Medici., LCSW 10/25/2013 9:40 AM  Other:  Agustina Caroli, NP 10/25/2013 9:40 AM  Other:  Karsten Fells, RN   Other:    Other:    Other:    Other:    Other:    Other:     Scribe for Treatment Team:

## 2013-10-25 NOTE — [facility] Suicide Risk Assessment (Signed)
[facility] INPATIENT:  Family/Significant Other Suicide Prevention Education  Suicide Prevention Education:  Education Completed; [name] has been identified by the patient as the family member/significant other with whom the patient will be residing, and identified as the person(s) who will aid the patient in the event of a mental health crisis (suicidal ideations/suicide attempt).  With written consent from the patient, the family member/significant other has been provided the following suicide prevention education, prior to the and/or following the discharge of the patient.  The suicide prevention education provided includes the following:  Suicide risk factors  Suicide prevention and interventions  National Suicide Hotline telephone number  [facility] assessment telephone number  [facility] Emergency Assistance [place] and/or Residential Mobile Crisis Unit telephone number  Request made of family/significant other to:  Remove weapons (e.g., guns, rifles, knives), all items previously/currently identified as safety concern.    Remove drugs/medications (over-the-counter, prescriptions, illicit drugs), all items previously/currently identified as a safety concern.  The family member/significant other verbalizes understanding of the suicide prevention education information provided.  The family member/significant other agrees to remove the items of safety concern listed above.  Patient's mother verbalized knowledge of warning signs and reported no additional concerns at this time.   [name] 10/25/2013, 10:54 AM

## 2013-10-29 NOTE — Progress Notes (Signed)
Patient Discharge Instructions:  After Visit Summary (AVS):   Faxed to:  10/29/13 Discharge Summary Note:   Faxed to:  10/29/13 Psychiatric Admission Assessment Note:   Faxed to:  10/29/13 Suicide Risk Assessment - Discharge Assessment:   Faxed to:  10/29/13 Faxed/Sent to the Next Level Care provider:  10/29/13 Faxed to [facility] [place], 10/29/2013, 3:45 PM

## 2013-11-05 ENCOUNTER — Inpatient Hospital Stay (HOSPITAL_COMMUNITY)
Admission: AD | Admit: 2013-11-05 | Discharge: 2013-11-09 | DRG: 897 | Disposition: A | Discharge status: Home / Self Care | Payer: No Typology Code available for payment source | Source: Intra-hospital | Attending: Psychiatry | Admitting: Psychiatry

## 2013-11-05 ENCOUNTER — Emergency Department (HOSPITAL_COMMUNITY)
Admission: EM | Admit: 2013-11-05 | Discharge: 2013-11-05 | Disposition: A | Discharge status: Other Acute Inpatient Hospital | Payer: Self-pay | Attending: Emergency Medicine | Admitting: Emergency Medicine

## 2013-11-05 ENCOUNTER — Encounter (HOSPITAL_COMMUNITY): Payer: Self-pay | Admitting: Emergency Medicine

## 2013-11-05 DIAGNOSIS — I10 Essential (primary) hypertension: Secondary | ICD-10-CM | POA: Diagnosis present

## 2013-11-05 DIAGNOSIS — R1013 Epigastric pain: Secondary | ICD-10-CM | POA: Insufficient documentation

## 2013-11-05 DIAGNOSIS — R45851 Suicidal ideations: Secondary | ICD-10-CM | POA: Insufficient documentation

## 2013-11-05 DIAGNOSIS — M1710 Unilateral primary osteoarthritis, unspecified knee: Secondary | ICD-10-CM | POA: Insufficient documentation

## 2013-11-05 DIAGNOSIS — F411 Generalized anxiety disorder: Secondary | ICD-10-CM | POA: Diagnosis present

## 2013-11-05 DIAGNOSIS — J45909 Unspecified asthma, uncomplicated: Secondary | ICD-10-CM | POA: Diagnosis present

## 2013-11-05 DIAGNOSIS — F1910 Other psychoactive substance abuse, uncomplicated: Secondary | ICD-10-CM | POA: Diagnosis present

## 2013-11-05 DIAGNOSIS — M129 Arthropathy, unspecified: Secondary | ICD-10-CM | POA: Diagnosis present

## 2013-11-05 DIAGNOSIS — F10230 Alcohol dependence with withdrawal, uncomplicated: Secondary | ICD-10-CM

## 2013-11-05 DIAGNOSIS — F1010 Alcohol abuse, uncomplicated: Principal | ICD-10-CM | POA: Diagnosis present

## 2013-11-05 DIAGNOSIS — F332 Major depressive disorder, recurrent severe without psychotic features: Secondary | ICD-10-CM | POA: Insufficient documentation

## 2013-11-05 DIAGNOSIS — T1491XA Suicide attempt, initial encounter: Secondary | ICD-10-CM

## 2013-11-05 DIAGNOSIS — F329 Major depressive disorder, single episode, unspecified: Secondary | ICD-10-CM | POA: Diagnosis present

## 2013-11-05 DIAGNOSIS — R000 Tachycardia, unspecified: Secondary | ICD-10-CM | POA: Insufficient documentation

## 2013-11-05 DIAGNOSIS — Z862 Personal history of diseases of the blood and blood-forming organs and certain disorders involving the immune mechanism: Secondary | ICD-10-CM | POA: Insufficient documentation

## 2013-11-05 DIAGNOSIS — Z8249 Family history of ischemic heart disease and other diseases of the circulatory system: Secondary | ICD-10-CM

## 2013-11-05 DIAGNOSIS — G4700 Insomnia, unspecified: Secondary | ICD-10-CM | POA: Diagnosis present

## 2013-11-05 DIAGNOSIS — F1020 Alcohol dependence, uncomplicated: Secondary | ICD-10-CM | POA: Diagnosis present

## 2013-11-05 DIAGNOSIS — F10220 Alcohol dependence with intoxication, uncomplicated: Secondary | ICD-10-CM

## 2013-11-05 DIAGNOSIS — K859 Acute pancreatitis without necrosis or infection, unspecified: Secondary | ICD-10-CM | POA: Insufficient documentation

## 2013-11-05 DIAGNOSIS — R748 Abnormal levels of other serum enzymes: Secondary | ICD-10-CM | POA: Insufficient documentation

## 2013-11-05 DIAGNOSIS — F17200 Nicotine dependence, unspecified, uncomplicated: Secondary | ICD-10-CM | POA: Insufficient documentation

## 2013-11-05 DIAGNOSIS — Z8669 Personal history of other diseases of the nervous system and sense organs: Secondary | ICD-10-CM | POA: Insufficient documentation

## 2013-11-05 DIAGNOSIS — R011 Cardiac murmur, unspecified: Secondary | ICD-10-CM | POA: Insufficient documentation

## 2013-11-05 DIAGNOSIS — K219 Gastro-esophageal reflux disease without esophagitis: Secondary | ICD-10-CM | POA: Diagnosis present

## 2013-11-05 DIAGNOSIS — F1994 Other psychoactive substance use, unspecified with psychoactive substance-induced mood disorder: Secondary | ICD-10-CM | POA: Diagnosis present

## 2013-11-05 DIAGNOSIS — Z79899 Other long term (current) drug therapy: Secondary | ICD-10-CM | POA: Insufficient documentation

## 2013-11-05 DIAGNOSIS — F3289 Other specified depressive episodes: Secondary | ICD-10-CM | POA: Diagnosis present

## 2013-11-05 DIAGNOSIS — R7989 Other specified abnormal findings of blood chemistry: Secondary | ICD-10-CM | POA: Insufficient documentation

## 2013-11-05 DIAGNOSIS — IMO0002 Reserved for concepts with insufficient information to code with codable children: Secondary | ICD-10-CM

## 2013-11-05 LAB — COMPREHENSIVE METABOLIC PANEL
ALT: 57 U/L — ABNORMAL HIGH (ref 0–53)
AST: 89 U/L — ABNORMAL HIGH (ref 0–37)
Albumin: 3.7 g/dL (ref 3.5–5.2)
Alkaline Phosphatase: 147 U/L — ABNORMAL HIGH (ref 39–117)
BUN: 4 mg/dL — AB (ref 6–23)
CO2: 25 mEq/L (ref 19–32)
CREATININE: 0.5 mg/dL (ref 0.50–1.35)
Calcium: 9.2 mg/dL (ref 8.4–10.5)
Chloride: 99 mEq/L (ref 96–112)
GFR calc non Af Amer: >90 mL/min (ref >90)
Glucose, Bld: 123 mg/dL — ABNORMAL HIGH (ref 70–99)
Potassium: 3.8 mEq/L (ref 3.7–5.3)
SODIUM: 138 meq/L (ref 137–147)
TOTAL PROTEIN: 8.7 g/dL — AB (ref 6.0–8.3)
Total Bilirubin: 1 mg/dL (ref 0.3–1.2)

## 2013-11-05 LAB — RAPID URINE DRUG SCREEN, HOSP PERFORMED
Amphetamines: NOT DETECTED
BENZODIAZEPINES: NOT DETECTED
Barbiturates: NOT DETECTED
COCAINE: NOT DETECTED
Opiates: NOT DETECTED
Tetrahydrocannabinol: NOT DETECTED

## 2013-11-05 LAB — CBC
HCT: 35.4 % — ABNORMAL LOW (ref 39.0–52.0)
Hemoglobin: 11.6 g/dL — ABNORMAL LOW (ref 13.0–17.0)
MCH: 32.2 pg (ref 26.0–34.0)
MCHC: 32.8 g/dL (ref 30.0–36.0)
MCV: 98.3 fL (ref 78.0–100.0)
Platelets: 368 10*3/uL (ref 150–400)
RBC: 3.6 MIL/uL — ABNORMAL LOW (ref 4.22–5.81)
RDW: 18.5 % — ABNORMAL HIGH (ref 11.5–15.5)
WBC: 8.2 10*3/uL (ref 4.0–10.5)

## 2013-11-05 LAB — LIPASE, BLOOD: Lipase: 199 U/L — ABNORMAL HIGH (ref 11–59)

## 2013-11-05 LAB — SALICYLATE LEVEL: Salicylate Lvl: <2 mg/dL — ABNORMAL LOW (ref 2.8–20.0)

## 2013-11-05 LAB — ACETAMINOPHEN LEVEL

## 2013-11-05 LAB — ETHANOL: Alcohol, Ethyl (B): 234 mg/dL — ABNORMAL HIGH (ref 0–11)

## 2013-11-05 MED ORDER — NICOTINE 21 MG/24HR TD PT24
21.0000 mg | MEDICATED_PATCH | Freq: Every day | TRANSDERMAL | Status: DC
  Administered 2013-11-05: 21 mg via TRANSDERMAL
  Filled 2013-11-05: qty 1

## 2013-11-05 MED ORDER — ALBUTEROL SULFATE (2.5 MG/3ML) 0.083% IN NEBU
2.5000 mg | INHALATION_SOLUTION | Freq: Four times a day (QID) | RESPIRATORY_TRACT | Status: DC | PRN
  Filled 2013-11-05: qty 3

## 2013-11-05 MED ORDER — HYDROMORPHONE HCL PF 1 MG/ML IJ SOLN
1.0000 mg | Freq: Once | INTRAMUSCULAR | Status: AC
  Administered 2013-11-05: 1 mg via INTRAMUSCULAR
  Filled 2013-11-05: qty 1

## 2013-11-05 MED ORDER — ONDANSETRON 8 MG PO TBDP
8.0000 mg | ORAL_TABLET | Freq: Once | ORAL | Status: AC
  Administered 2013-11-05: 8 mg via ORAL
  Filled 2013-11-05: qty 2

## 2013-11-05 MED ORDER — NICOTINE 14 MG/24HR TD PT24: 14.0000 mg | MEDICATED_PATCH | Freq: Every day | TRANSDERMAL | Status: DC

## 2013-11-05 MED ORDER — ZOLPIDEM TARTRATE 5 MG PO TABS: 5.0000 mg | ORAL_TABLET | Freq: Every evening | ORAL | Status: DC | PRN

## 2013-11-05 MED ORDER — LORAZEPAM 1 MG PO TABS
1.0000 mg | ORAL_TABLET | Freq: Three times a day (TID) | ORAL | Status: DC | PRN
  Administered 2013-11-05: 1 mg via ORAL
  Filled 2013-11-05: qty 1

## 2013-11-05 MED ORDER — ALUM & MAG HYDROXIDE-SIMETH 200-200-20 MG/5ML PO SUSP
30.0000 mL | ORAL | Status: DC | PRN
  Administered 2013-11-05 (×2): 30 mL via ORAL
  Filled 2013-11-05 (×2): qty 30

## 2013-11-05 MED ORDER — LACTULOSE 10 GM/15ML PO SOLN
10.0000 g | Freq: Every day | ORAL | Status: DC
  Administered 2013-11-05: 10 g via ORAL
  Filled 2013-11-05: qty 15

## 2013-11-05 MED ORDER — ONDANSETRON HCL 4 MG PO TABS: 4.0000 mg | ORAL_TABLET | Freq: Three times a day (TID) | ORAL | Status: DC | PRN

## 2013-11-05 MED ORDER — PANCRELIPASE (LIP-PROT-AMYL) 12000-38000 UNITS PO CPEP: 1.0000 | ORAL_CAPSULE | Freq: Three times a day (TID) | ORAL | Status: DC

## 2013-11-05 MED ORDER — ALBUTEROL SULFATE HFA 108 (90 BASE) MCG/ACT IN AERS: 1.0000 | INHALATION_SPRAY | Freq: Four times a day (QID) | RESPIRATORY_TRACT | Status: DC | PRN

## 2013-11-05 MED ORDER — IBUPROFEN 200 MG PO TABS
600.0000 mg | ORAL_TABLET | Freq: Three times a day (TID) | ORAL | Status: DC | PRN
  Administered 2013-11-05: 600 mg via ORAL
  Filled 2013-11-05: qty 3

## 2013-11-05 MED ORDER — PANTOPRAZOLE SODIUM 20 MG PO TBEC
20.0000 mg | DELAYED_RELEASE_TABLET | Freq: Every day | ORAL | Status: DC
  Administered 2013-11-05: 20 mg via ORAL
  Filled 2013-11-05 (×2): qty 1

## 2013-11-05 MED ORDER — METOPROLOL TARTRATE 25 MG PO TABS
50.0000 mg | ORAL_TABLET | Freq: Two times a day (BID) | ORAL | Status: DC
  Administered 2013-11-05 (×2): 50 mg via ORAL
  Filled 2013-11-05 (×2): qty 2

## 2013-11-05 MED ORDER — QUETIAPINE FUMARATE 100 MG PO TABS
100.0000 mg | ORAL_TABLET | Freq: Every day | ORAL | Status: DC
  Administered 2013-11-05: 100 mg via ORAL
  Filled 2013-11-05: qty 1

## 2013-11-05 MED ORDER — PANCRELIPASE (LIP-PROT-AMYL) 12000-38000 UNITS PO CPEP
1.0000 | ORAL_CAPSULE | Freq: Three times a day (TID) | ORAL | Status: DC
  Administered 2013-11-05: 1 via ORAL
  Filled 2013-11-05 (×3): qty 1

## 2013-11-05 NOTE — Consult Note (Signed)
  Patient states that he has some depression related to his 21 yr girlfriend being pregnant and was informed to terminate the pregnancy related to her heart condition that could possible cause death to her or the fetus.  Patient also states that he has increased his alcohol consumption and that he has been off of his medication for three days related to causing him to have vivid dreams (Seroquel).  States [facility] outpatient provider and has changed his medication to Prozac 20 mg but he has not picked up prescription:  Agree with TTS assessment Recommend inpatient treatment.  Patient has been accepted to [facility].  Will continue to monitor for safety and stabilization until transferred.  Start librium prn and Prozac 20 mg.  Shuvon B. [facility] FNP-BC

## 2013-11-05 NOTE — ED Notes (Signed)
Patient has one bag of belongings in locker 30.

## 2013-11-05 NOTE — ED Provider Notes (Signed)
CSN: [id number]     Arrival date & time 11/05/13  1315 History  This chart was scribed for  Lorenda Peck, working with Provider Default, MD by Stacy Gardner, ED scribe. This patient was seen in room [facility] and the patient's care was started at 7:57 PM.   First MD Initiated Contact with Patient 11/05/13 1322     Chief Complaint  Patient presents with  . depression/suicidal      (Consider location/radiation/quality/duration/timing/severity/associated sxs/prior Treatment) The history is provided by the patient and medical records. No language interpreter was used.   HPI Comments: [name] is a 43 y.o. male who presents to the Emergency Department complaining of SI and depression. [name] girlfriend had an abortion yesterday because the doctor told her that she may have complications with her pregnancy. He is upset that she went through with the procedure and think she did it out of spite. Pt mentions having a SI plan yesterday which includes getting a gun from his fathers home. This is his second suicidal attempt. His last attempt was 5/29, he took "80 something pills". He was admitted to the hospital for 19 days and was in critical condition. Denies the use of drugs. He complains that his "pancrease hurts" and he has a hx of pancreatitis. He drank a few beers last night and one this morning. Pt explains his last drink was 5 weeks prior to being admitted to the hospital. He vomited once today and has diarrhea. He states drinking chicken broth irritates his stomach.    Past Medical History  Diagnosis Date  . Hypertension   . Asthma   . Pancreatitis   . Cocaine abuse   . Depression   . H/O suicide attempt   . Heart murmur     "when he was little" (03/06/2013)  . Shortness of breath     "can happen at anytime" (03/06/2013)  . Anemia   . H/O hiatal hernia   . GERD (gastroesophageal reflux disease)   . Seizures     "weekly" (03/06/2013)  . Arthritis     "knees"  (03/06/2013)  . Anxiety   . WPW (Wolff-Parkinson-White syndrome)     [name] 03/06/2013   Past Surgical History  Procedure Laterality Date  . Facial fracture surgery Left 1990's    "result of trauma" (03/06/2013)  . Eye surgery Left 1990's    "result of trauma" (03/06/2013)   Family History  Problem Relation Age of Onset  . Hypertension Other   . Coronary artery disease Other    History  Substance Use Topics  . Smoking status: Current Every Day Smoker -- 1.50 packs/day for 30 years    Types: Cigarettes  . Smokeless tobacco: Current User  . Alcohol Use: 25.2 oz/week    40 Cans of beer, 2 Shots of liquor per week     Comment: 03/06/2013 "1, 40oz/day; plus couple shots liquor once/wk" 10/05/13 pt states he drank 3 40oz today    Review of Systems  Gastrointestinal: Positive for vomiting and abdominal pain.  Psychiatric/Behavioral: Positive for suicidal ideas and dysphoric mood. Negative for self-injury.  All other systems reviewed and are negative.     Allergies  Trazodone and nefazodone and Shellfish-derived products  Home Medications   Prior to Admission medications   Medication Sig Start Date End Date Taking? Authorizing Provider  albuterol (PROVENTIL HFA;VENTOLIN HFA) 108 (90 BASE) MCG/ACT inhaler Inhale 1-2 puffs into the lungs every 6 (six) hours as needed for wheezing or shortness of breath.  10/25/13   Encarnacion Slates, NP  lactulose (CHRONULAC) 10 GM/15ML solution Take 15 mLs (10 g total) by mouth daily. For constipation 10/25/13   Encarnacion Slates, NP  lansoprazole (PREVACID) 15 MG capsule Take 1 capsule (15 mg total) by mouth 2 (two) times daily as needed (for reflux). 10/25/13   Encarnacion Slates, NP  lipase/protease/amylase (CREON-12/PANCREASE) 12000 UNITS CPEP capsule Take 1 capsule by mouth 3 (three) times daily with meals. For pancrease problems 10/25/13   Encarnacion Slates, NP  lipase/protease/amylase (CREON-12/PANCREASE) 12000 UNITS CPEP capsule Take 1 capsule by mouth 3  (three) times daily with meals. For pancreatic enzyme replacement 10/25/13   Encarnacion Slates, NP  LORazepam (ATIVAN) 1 MG tablet Take 1 tablet Q three times daily as needed: For anxiety 10/25/13   Encarnacion Slates, NP  metoprolol (LOPRESSOR) 50 MG tablet Take 1 tablet (50 mg total) by mouth 2 (two) times daily. For high blood pressure 10/25/13   Encarnacion Slates, NP  nicotine (NICODERM CQ - DOSED IN MG/24 HOURS) 14 mg/24hr patch Place 1 patch (14 mg total) onto the skin daily. For nicotine dependence 10/25/13   Encarnacion Slates, NP  ondansetron (ZOFRAN ODT) 4 MG disintegrating tablet Take 1 tablet (4 mg total) by mouth every 8 (eight) hours as needed for nausea or vomiting. 10/25/13   Encarnacion Slates, NP  oxyCODONE-acetaminophen (PERCOCET/ROXICET) 5-325 MG per tablet Take 2 tablets by mouth every 6 (six) hours as needed for moderate pain or severe pain. 10/25/13   Encarnacion Slates, NP  QUEtiapine (SEROQUEL) 100 MG tablet Take 1 tablet (100 mg total) by mouth at bedtime. For mood control 10/25/13   Encarnacion Slates, NP   BP 136/95  Pulse 93  Temp(Src) 98.3 F (36.8 C) (Oral)  Resp 18  SpO2 100% Physical Exam  Nursing note and vitals reviewed. Constitutional: He is oriented to person, place, and time. He appears well-developed and well-nourished. No distress.  HENT:  Head: Normocephalic and atraumatic.  Eyes: Conjunctivae and EOM are normal. No scleral icterus.  Neck: Normal range of motion. Neck supple.  Cardiovascular: Regular rhythm and normal heart sounds.  Tachycardia present.   Pulmonary/Chest: Effort normal and breath sounds normal.  Abdominal: There is tenderness (mild) in the epigastric area.  No peritoneal sign.    Musculoskeletal: Normal range of motion. He exhibits no edema.  Neurological: He is alert and oriented to person, place, and time.  Skin: Skin is warm and dry.  Psychiatric: His behavior is normal. He exhibits a depressed mood. He expresses suicidal ideation. He expresses no homicidal ideation.  He expresses suicidal plans.  Suicidal plans    ED Course  Procedures (including critical care time) DIAGNOSTIC STUDIES: Oxygen Saturation is 100% on room air, normal by my interpretation.    COORDINATION OF CARE:  1:36 PM Discussed course of care with pt. Pt understands and agrees.    Labs Review Labs Reviewed  CBC - Abnormal; Notable for the following:    RBC 3.60 (*)    Hemoglobin 11.6 (*)    HCT 35.4 (*)    RDW 18.5 (*)    All other components within normal limits  COMPREHENSIVE METABOLIC PANEL - Abnormal; Notable for the following:    Glucose, Bld 123 (*)    BUN 4 (*)    Total Protein 8.7 (*)    AST 89 (*)    ALT 57 (*)    Alkaline Phosphatase 147 (*)    All other  components within normal limits  LIPASE, BLOOD - Abnormal; Notable for the following:    Lipase 199 (*)    All other components within normal limits  ETHANOL - Abnormal; Notable for the following:    Alcohol, Ethyl (B) 234 (*)    All other components within normal limits  SALICYLATE LEVEL - Abnormal; Notable for the following:    Salicylate Lvl <4.5 (*)    All other components within normal limits  URINE RAPID DRUG SCREEN (HOSP PERFORMED)  ACETAMINOPHEN LEVEL    Imaging Review No results found.   EKG Interpretation None      MDM   Final diagnoses:  Alcohol dependence with uncomplicated intoxication  MDD (major depressive disorder), recurrent severe, without psychosis  Suicide attempt   Pt presenting with suicidal ideation and plan. Hs of pancreatitis. Lipase and LFTs elevated, ETOH 234. No active nausea or emesis. He appears in NAD.   During evaluation, pt attempted to leave AMA. IVC paperwork completed as pt is suicidal with a plan.  Pt accepted to [facility].  Case discussed with attending Dr. Ashok Cordia who also evaluated patient and agrees with plan of care.   I personally performed the services described in this documentation, which was scribed in my presence. The recorded information has  been reviewed and is accurate.  Illene Labrador, PA-C 11/05/13 2000

## 2013-11-05 NOTE — ED Notes (Signed)
Per pt, states he is depressed because his girl got an abortion-thinks she did it to hurt him-SI since yesterday

## 2013-11-05 NOTE — ED Notes (Signed)
Pt transported to [facility] by [facility] for continuation of specialized care. He left in no acute distress. 

## 2013-11-05 NOTE — ED Notes (Signed)
Bed: WA30 Expected date:  Expected time:  Means of arrival:  Comments: Hold for triage 4 

## 2013-11-05 NOTE — [facility] Counselor (Signed)
Per Letitia Libra [facility], pt can be transported to bed 301-2 after 7 pm. Support paperwork signed and faxed to [facility]. Originals placed in pt's chart.  Arnold Long, [place] Assessment Counselor
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
## 2013-11-06 ENCOUNTER — Encounter (HOSPITAL_COMMUNITY): Payer: Self-pay | Admitting: *Deleted

## 2013-11-06 DIAGNOSIS — F1994 Other psychoactive substance use, unspecified with psychoactive substance-induced mood disorder: Secondary | ICD-10-CM

## 2013-11-06 LAB — HEPATIC FUNCTION PANEL
ALT: 69 U/L — AB (ref 0–53)
AST: 120 U/L — AB (ref 0–37)
Albumin: 3.4 g/dL — ABNORMAL LOW (ref 3.5–5.2)
Alkaline Phosphatase: 143 U/L — ABNORMAL HIGH (ref 39–117)
BILIRUBIN TOTAL: 0.7 mg/dL (ref 0.3–1.2)
Bilirubin, Direct: 0.3 mg/dL (ref 0.0–0.3)
Indirect Bilirubin: 0.4 mg/dL (ref 0.3–0.9)
Total Protein: 7.8 g/dL (ref 6.0–8.3)

## 2013-11-06 LAB — LIPASE, BLOOD: Lipase: 144 U/L — ABNORMAL HIGH (ref 11–59)

## 2013-11-06 MED ORDER — LORAZEPAM 1 MG PO TABS
1.0000 mg | ORAL_TABLET | Freq: Every day | ORAL | Status: AC
  Administered 2013-11-09: 1 mg via ORAL
  Filled 2013-11-06: qty 1

## 2013-11-06 MED ORDER — OXYCODONE HCL ER 15 MG PO T12A
15.0000 mg | EXTENDED_RELEASE_TABLET | Freq: Two times a day (BID) | ORAL | Status: DC
  Administered 2013-11-06 – 2013-11-09 (×7): 15 mg via ORAL
  Filled 2013-11-06 (×7): qty 1

## 2013-11-06 MED ORDER — ALBUTEROL SULFATE HFA 108 (90 BASE) MCG/ACT IN AERS: 1.0000 | INHALATION_SPRAY | Freq: Four times a day (QID) | RESPIRATORY_TRACT | Status: DC | PRN

## 2013-11-06 MED ORDER — ONDANSETRON 4 MG PO TBDP: 4.0000 mg | ORAL_TABLET | Freq: Three times a day (TID) | ORAL | Status: DC | PRN

## 2013-11-06 MED ORDER — PANTOPRAZOLE SODIUM 40 MG PO TBEC
40.0000 mg | DELAYED_RELEASE_TABLET | Freq: Every day | ORAL | Status: DC
Start: 2013-11-06 — End: 2013-11-09
  Administered 2013-11-06 – 2013-11-09 (×4): 40 mg via ORAL
  Filled 2013-11-06 (×6): qty 1

## 2013-11-06 MED ORDER — ADULT MULTIVITAMIN W/MINERALS CH
1.0000 | ORAL_TABLET | Freq: Every day | ORAL | Status: DC
  Administered 2013-11-06 – 2013-11-09 (×4): 1 via ORAL
  Filled 2013-11-06 (×8): qty 1

## 2013-11-06 MED ORDER — ACETAMINOPHEN 325 MG PO TABS: 650.0000 mg | ORAL_TABLET | Freq: Four times a day (QID) | ORAL | Status: DC | PRN

## 2013-11-06 MED ORDER — QUETIAPINE FUMARATE 100 MG PO TABS
100.0000 mg | ORAL_TABLET | Freq: Every day | ORAL | Status: DC
  Administered 2013-11-06 – 2013-11-08 (×3): 100 mg via ORAL
  Filled 2013-11-06: qty 14
  Filled 2013-11-06 (×3): qty 1
  Filled 2013-11-06: qty 14
  Filled 2013-11-06 (×2): qty 1

## 2013-11-06 MED ORDER — ADULT MULTIVITAMIN W/MINERALS CH
1.0000 | ORAL_TABLET | Freq: Every day | ORAL | Status: DC
  Filled 2013-11-06: qty 1

## 2013-11-06 MED ORDER — LACTULOSE 10 GM/15ML PO SOLN
10.0000 g | Freq: Every day | ORAL | Status: DC
  Administered 2013-11-06 – 2013-11-09 (×4): 10 g via ORAL
  Filled 2013-11-06 (×6): qty 15

## 2013-11-06 MED ORDER — LOPERAMIDE HCL 2 MG PO CAPS: 2.0000 mg | ORAL_CAPSULE | ORAL | Status: AC | PRN

## 2013-11-06 MED ORDER — QUETIAPINE FUMARATE 100 MG PO TABS
100.0000 mg | ORAL_TABLET | Freq: Every day | ORAL | Status: DC
  Filled 2013-11-06: qty 1

## 2013-11-06 MED ORDER — ONDANSETRON 4 MG PO TBDP: 4.0000 mg | ORAL_TABLET | Freq: Four times a day (QID) | ORAL | Status: AC | PRN

## 2013-11-06 MED ORDER — MAGNESIUM HYDROXIDE 400 MG/5ML PO SUSP: 30.0000 mL | Freq: Every day | ORAL | Status: DC | PRN

## 2013-11-06 MED ORDER — ALUM & MAG HYDROXIDE-SIMETH 200-200-20 MG/5ML PO SUSP
30.0000 mL | ORAL | Status: DC | PRN
  Administered 2013-11-06 – 2013-11-09 (×7): 30 mL via ORAL

## 2013-11-06 MED ORDER — LORAZEPAM 1 MG PO TABS
1.0000 mg | ORAL_TABLET | Freq: Three times a day (TID) | ORAL | Status: AC
  Administered 2013-11-07 (×3): 1 mg via ORAL
  Filled 2013-11-06 (×3): qty 1

## 2013-11-06 MED ORDER — HYDROXYZINE HCL 25 MG PO TABS
25.0000 mg | ORAL_TABLET | Freq: Four times a day (QID) | ORAL | Status: AC | PRN
  Administered 2013-11-07 – 2013-11-08 (×2): 25 mg via ORAL
  Filled 2013-11-06 (×2): qty 1

## 2013-11-06 MED ORDER — METOPROLOL TARTRATE 50 MG PO TABS
50.0000 mg | ORAL_TABLET | Freq: Two times a day (BID) | ORAL | Status: DC
  Administered 2013-11-06 – 2013-11-09 (×7): 50 mg via ORAL
  Filled 2013-11-06 (×11): qty 1

## 2013-11-06 MED ORDER — CHLORDIAZEPOXIDE HCL 25 MG PO CAPS: 25.0000 mg | ORAL_CAPSULE | Freq: Four times a day (QID) | ORAL | Status: DC | PRN

## 2013-11-06 MED ORDER — HYDROXYZINE HCL 50 MG PO TABS: 50.0000 mg | ORAL_TABLET | Freq: Every evening | ORAL | Status: DC | PRN

## 2013-11-06 MED ORDER — METOPROLOL TARTRATE 50 MG PO TABS
50.0000 mg | ORAL_TABLET | Freq: Two times a day (BID) | ORAL | Status: DC
  Filled 2013-11-06: qty 1

## 2013-11-06 MED ORDER — LORAZEPAM 1 MG PO TABS
1.0000 mg | ORAL_TABLET | Freq: Four times a day (QID) | ORAL | Status: AC
  Administered 2013-11-06 (×3): 1 mg via ORAL
  Filled 2013-11-06 (×3): qty 1

## 2013-11-06 MED ORDER — LORAZEPAM 1 MG PO TABS
1.0000 mg | ORAL_TABLET | Freq: Two times a day (BID) | ORAL | Status: AC
  Administered 2013-11-08 (×2): 1 mg via ORAL
  Filled 2013-11-06 (×2): qty 1

## 2013-11-06 MED ORDER — LACTULOSE 10 GM/15ML PO SOLN
10.0000 g | Freq: Every day | ORAL | Status: DC
  Filled 2013-11-06: qty 15

## 2013-11-06 MED ORDER — LORAZEPAM 1 MG PO TABS
1.0000 mg | ORAL_TABLET | Freq: Three times a day (TID) | ORAL | Status: DC | PRN
  Administered 2013-11-06 – 2013-11-09 (×4): 1 mg via ORAL
  Filled 2013-11-06 (×4): qty 1

## 2013-11-06 MED ORDER — PANCRELIPASE (LIP-PROT-AMYL) 12000-38000 UNITS PO CPEP
1.0000 | ORAL_CAPSULE | Freq: Three times a day (TID) | ORAL | Status: DC
  Filled 2013-11-06: qty 1

## 2013-11-06 MED ORDER — THIAMINE HCL 100 MG/ML IJ SOLN: 100.0000 mg | Freq: Once | INTRAMUSCULAR | Status: DC

## 2013-11-06 MED ORDER — VITAMIN B-1 100 MG PO TABS
100.0000 mg | ORAL_TABLET | Freq: Every day | ORAL | Status: DC
  Administered 2013-11-07 – 2013-11-09 (×3): 100 mg via ORAL
  Filled 2013-11-06 (×5): qty 1

## 2013-11-06 MED ORDER — PANCRELIPASE (LIP-PROT-AMYL) 12000-38000 UNITS PO CPEP
1.0000 | ORAL_CAPSULE | Freq: Three times a day (TID) | ORAL | Status: DC
  Administered 2013-11-06 – 2013-11-09 (×11): 1 via ORAL
  Filled 2013-11-06 (×17): qty 1

## 2013-11-06 MED ORDER — PANTOPRAZOLE SODIUM 20 MG PO TBEC
20.0000 mg | DELAYED_RELEASE_TABLET | Freq: Every day | ORAL | Status: DC
  Filled 2013-11-06: qty 1

## 2013-11-06 MED ORDER — NICOTINE 14 MG/24HR TD PT24
14.0000 mg | MEDICATED_PATCH | Freq: Every day | TRANSDERMAL | Status: DC
  Administered 2013-11-06 – 2013-11-09 (×4): 14 mg via TRANSDERMAL
  Filled 2013-11-06 (×4): qty 1
  Filled 2013-11-06: qty 14
  Filled 2013-11-06 (×2): qty 1
  Filled 2013-11-06: qty 14

## 2013-11-06 NOTE — [facility] Counselor (Signed)
Adult Psychosocial Assessment Update Interdisciplinary Team  Previous [facility] admissions/discharges:  Admissions Discharges  Date: 10/22/13 Date: 10/25/13  Date: 09/28/11 Date: 10/04/11  Date: Date:  Date: Date:  Date: Date:   Changes since the last Psychosocial Assessment (including adherence to outpatient mental health and/or substance abuse treatment, situational issues contributing to decompensation and/or relapse). [name] is an 43 y.o. male. Per chart review, pt intentionally ingested some of his home meds to kill himself 10/05/13. Pt reports he tried to commit suicide b/c his girlfriend told him she was having an abortion. He says he ingested Clonidine, flexeril and some blood pressure pills on 10/05/13. [name] affect is depressed and he reports depressed mood. His BAL was 234 upon arrival. Pt currently denies SI and HI. He denies [facility]. Pt was placed under IVC by EDP Steinl upon arrival at [facility]. Pt sts he is depressed b/c he found out yesterday that his girlfriend actually had the abortion. Pt sts he began drinking last night and last drink was at 3 am today. Pt sts he drank approx. 12 12-oz beers. Pt sts he drinks daily and requests alcohol detox. Longest amount of sober time has been 4 mos per patient. Pt sts he went to treatment Nov 14 at [facility] and went to treatment in 1999 at [facility]. Pt endorses hopelessness and loss of interest in usual pleasures. 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 Pt denies suicidal ideation, intent and plan. He denies hx seizures             Discharge Plan 1. Will you be returning to the same living situation after discharge?   Yes: No:      If no, what is your plan?    At this time, pt plans to return to his mother's home upon d/c. He is requesting that [facility] pay for transport to [place] in order for him to "find my babymama and live with her." CSW explained that [facility] cannot do this for him and encouraged him to work  this out with family/friend supports.        2. Would you like a referral for services when you are discharged? Yes:     If yes, for what services?  No:       Pt plans to continue follow-up at [facility] upon d/c and is not open to inpatient services at this time.        Summary and Recommendations (to be completed by the evaluator) Pt is 43 year old male living in [place], [place] with his mother. He presents to [facility] IVC after attempted overdose and for mood stabilization, med management, and ETOH detox. Recommendations for pt include: crisis stabilization, therapeutic milieu, encourage group attendance and participation, ativan taper for withdrawals, medication management for mood stabilization, and development of comprehensive mental wellness/sobriety plan. Pt plans to return home with his mother at d/c and continue follow up at [facility]. He stated that his long term plan is to get in contact with his "baby mama" in [place] and move in with her. Pt refusing referral for [facility] IOP/inpatient treatment.                        Signature:  Higinio Roger 11/06/2013 12:19 PM

## 2013-11-06 NOTE — Tx Team (Signed)
Initial Interdisciplinary Treatment Plan  PATIENT STRENGTHS: (choose at least two) Ability for insight Active sense of humor Average or above average intelligence Capable of independent living Communication skills General fund of knowledge Motivation for treatment/growth Physical Health Special hobby/interest Supportive family/friends Work skills  PATIENT STRESSORS: Financial difficulties Health problems Legal issue Loss of baby in Sept 2014, June 2015 Marital or family conflict Substance abuse   PROBLEM LIST: Problem List/Patient Goals Date to be addressed Date deferred Reason deferred Estimated date of resolution  "Detoxing" 11/06/13     "To think clearly" 11/06/13           Depression 11/06/13     Increased risk for suicide 11/06/13     Substance abuse 11/06/13                        DISCHARGE CRITERIA:  Ability to meet basic life and health needs Adequate post-discharge living arrangements Improved stabilization in mood, thinking, and/or behavior Medical problems require only outpatient monitoring Motivation to continue treatment in a less acute level of care Need for constant or close observation no longer present Reduction of life-threatening or endangering symptoms to within safe limits Safe-care adequate arrangements made Verbal commitment to aftercare and medication compliance Withdrawal symptoms are absent or subacute and managed without 24-hour nursing intervention  PRELIMINARY DISCHARGE PLAN: Attend 12-step recovery group Outpatient therapy Participate in family therapy Return to previous living arrangement  PATIENT/FAMIILY INVOLVEMENT: This treatment plan has been presented to and reviewed with the patient, [name], and/or family member.  The patient and family have been given the opportunity to ask questions and make suggestions.  Wynonia Hazard Laverne 11/06/2013, 1:31 AM

## 2013-11-06 NOTE — BHH Group Notes (Signed)
[place] LCSW Group Therapy  11/06/2013 3:10 PM  Type of Therapy:  Group Therapy  Participation Level:  Active  Participation Quality:  Attentive  Affect:  Depressed and Flat  Cognitive:  Lacking  Insight:  Limited  Engagement in Therapy:  Improving  Modes of Intervention:  Confrontation, Discussion, Education, Exploration, Problem-solving, Rapport Building, Socialization and Support  Summary of Progress/Problems: Emotion Regulation: This group focused on both positive and negative emotion identification and allowed group members to process ways to identify feelings, regulate negative emotions, and find healthy ways to manage internal/external emotions. Group members were asked to reflect on a time when their reaction to an emotion led to a negative outcome and explored how alternative responses using emotion regulation would have benefited them. Group members were also asked to discuss a time when emotion regulation was utilized when a negative emotion was experienced. [name] was attentive and engaged throughout today's therapy group. He had difficulty remaining on topic and difficulty processing subject matter in group. He was able to identify "depression and anger" as two emotions that he struggles with regulating. He talked about his most recent interaction with his girlfriend and how his anger outburst due to being lied to caused him to say things he did not mean. [name] shows limited insight aeb his inability to identify how he could have handled the situation in a more constructive manner. He was open to listening to suggestions from others in the group members showing progress in the group setting.    Smart, Lyle Niblett LCSWA  11/06/2013, 3:10 PM

## 2013-11-06 NOTE — Progress Notes (Signed)
Patient ID: [name], male   DOB: [birth year], 43 y.o.   MRN: [id number]  Pt was pleasant and cooperative, but flat and depressed during the assessment. Pt informed the writer that he's lost " 2 babies since Sept". Stated that a "white girl" was pregnant for him in Sept 2014, than his ex girlfriend had an abortion last week. Stated they haven't broken up but neither of them want to be around the other.  Pt has been depressed and having suicidal thoughts. Pt has attempted in May by ingesting meds.  At this time pt lives with his mother and grandmother. Pt has a hx of pancreatitis, Wolff Parkinson White syndrom, htn, and gerd. Pt is still positive SI, but contracts for safety.

## 2013-11-06 NOTE — BHH Group Notes (Signed)
Adult Psychoeducational Group Note  Date:  11/06/2013 Time:  10:23 PM  Group Topic/Focus:  Personal Choices and Values:   The focus of this group is to help patients assess and explore the importance of values in their lives, how their values affect their decisions, how they express their values and what opposes their expression.  Participation Level:  Active  Participation Quality:  Appropriate  Affect:  Appropriate  Cognitive:  Alert  Insight: Appropriate  Engagement in Group:  Engaged  Modes of Intervention:  Discussion  Additional Comments:  Pt stated his energy level currently at 5 out of 10.  Pt stated he was at [facility] due to alcohol dependency.  Pt stated he wants to right his wrongs and has been working on writing letters to each of the people in his life that he has wronged.   Gladis Riffle 11/06/2013, 10:23 PM
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
## 2013-11-06 NOTE — Progress Notes (Signed)
Pt observed in the dayroom most of the shift talking with peers and at times in the activity room working on a puzzle talking with peers.  Pt has been somatic in his complaints tonight and is on scheduled pain medications.  His BP continues elevated with providers aware.  Pt is detoxing from alcohol and cocaine.  Pt denies SI/HI/AV at this time.  Pt is unsure of his discharge plans, but would possibly like rehab if he can't get to [place].  Pt is medicated as ordered.  Pt makes his needs known to staff.  Support and encouragement offered.  Safety maintained with q15 minute checks.
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
## 2013-11-06 NOTE — Progress Notes (Signed)
Patient ID: [name], male   DOB: 02-Mar-1971, 43 y.o.   MRN: [id number] He has been up and about and to groups he is depressed because his girlfriend , same one, in [place] had another abortion. Said that he see's her once a month for 3 day every month. She is going through a divorce. He spoke the abortion not being [facility]. Self inventory: depression 3. Hopelessness 4, denies SI thoughts, withdrawal , craving, agitation and denies SI thoughts.

## 2013-11-06 NOTE — Consult Note (Signed)
Face to face evaluation and I agree with this note 

## 2013-11-06 NOTE — BHH Group Notes (Signed)
[facility] LCSW Aftercare Discharge Planning Group Note   11/06/2013 11:03 AM  Participation Quality:  Minimal   Mood/Affect:  Depressed and Flat  Depression Rating:  3  Anxiety Rating:  5  Thoughts of Suicide:  No Will you contract for safety?   NA  Current AVH:  No  Plan for Discharge/Comments:  Pt stated that he was taking his medications but was still experiencing SI soon after d/c from [facility] a few weeks ago. Pt reports continuing problems with girlfriend. He lives at home with his mother and plans to return at d/c until he can move to [place] to be with his girlfriend. Pt requesting that [facility] pay for transportation to [place]. CSW informed pt that this was not possible and encouraged him to work with family members to assist with this. Pt plans to return to [place] while he is still in [place]. He is not open to inpatient treatment.   Transportation Means: bus   Supports: mother  Proofreader, Research officer, trade union

## 2013-11-07 MED ORDER — SIMETHICONE 80 MG PO CHEW
160.0000 mg | CHEWABLE_TABLET | Freq: Four times a day (QID) | ORAL | Status: DC | PRN
  Administered 2013-11-07 – 2013-11-09 (×5): 160 mg via ORAL
  Filled 2013-11-07 (×6): qty 2

## 2013-11-07 MED ORDER — ENSURE COMPLETE PO LIQD
237.0000 mL | Freq: Three times a day (TID) | ORAL | Status: DC
  Administered 2013-11-07 – 2013-11-08 (×4): 237 mL via ORAL

## 2013-11-07 MED ORDER — SERTRALINE HCL 50 MG PO TABS
50.0000 mg | ORAL_TABLET | Freq: Every day | ORAL | Status: DC
  Administered 2013-11-07 – 2013-11-08 (×2): 50 mg via ORAL
  Filled 2013-11-07 (×4): qty 1
  Filled 2013-11-07: qty 14

## 2013-11-07 MED ORDER — QUETIAPINE FUMARATE 25 MG PO TABS
25.0000 mg | ORAL_TABLET | Freq: Two times a day (BID) | ORAL | Status: DC
  Administered 2013-11-07 – 2013-11-09 (×4): 25 mg via ORAL
  Filled 2013-11-07: qty 28
  Filled 2013-11-07: qty 1
  Filled 2013-11-07 (×3): qty 28
  Filled 2013-11-07 (×3): qty 1

## 2013-11-07 NOTE — Progress Notes (Signed)
D   Pt is sad and depressed    He attended group and was appropriate  He said he continues to grieve over the loss of his unborn child   He requests medications frequently A   Verbal support given   Medications administered and effectiveness monitored   Q 15 min checks R   Pt safe at present

## 2013-11-07 NOTE — Progress Notes (Signed)
Recreation Therapy Notes  Animal-Assisted Activity/Therapy (AAA/T) Program Checklist/Progress Notes Patient Eligibility Criteria Checklist & Daily Group note for Rec Tx Intervention  Date: 07.02.2015 Time: 2:45pm Location: [address]   AAA/T Program Assumption of Risk Form signed by Patient/ or Parent Legal Guardian yes  Patient is free of allergies or sever asthma yes  Patient reports no fear of animals yes  Patient reports no history of cruelty to animals yes   Patient understands his/her participation is voluntary yes  Patient washes hands before animal contact yes  Patient washes hands after animal contact yes  Behavioral Response: Appropriate   Education: Hand Washing, Appropriate Animal Interaction   Education Outcome: Acknowledges understanding   Clinical Observations/Feedback: Patient interacted appropriately with therapeutic dog team during sessions.   Laureen Ochs Salli Bodin, LRT/CTRS  Jancy Sprankle L 11/07/2013 4:06 PM

## 2013-11-07 NOTE — Progress Notes (Signed)
NUTRITION ASSESSMENT  Pt identified as at risk on the Malnutrition Screen Tool  INTERVENTION: 1. Educated patient on the importance of nutrition and encouraged intake of food and beverages. 2. Encouraged continued adherence to low fat diet 3. Supplements: Ensure Complete TID  NUTRITION DIAGNOSIS: Unintentional weight loss related to sub-optimal intake as evidenced by pt report.   Goal: Pt to meet >/= 90% of their estimated nutrition needs.  Monitor:  PO intake  Assessment:  Admitted with depression and suicidal thoughts - reports pt has lost 2 babies since September. Went through 6 days of heavy drinking, 2 40 ounces/day.   - Pt seen by inpatient RD during admission last month and was advised to follow low fat diet r/t pt having pancreatitis - Met with pt who reports he's been eating low fat foods and avoiding spicy foods - States he's appetite isn't great and was living off of tomato soup and chicken broth for 2 weeks - Reports his goal weight is 147 pounds but weight last month was 126 pounds, no weight available for current admission  - C/o having some diarrhea - Ate 100% of breakfast - Interested in getting Ensure Complete during admission, will order    43 y.o. male  Height: Ht Readings from Last 1 Encounters:  10/22/13 $RemoveB'5\' 9"'[id number]$  (1.753 m)    Weight: Wt Readings from Last 1 Encounters:  10/22/13 126 lb (57.153 kg)    Weight Hx: Wt Readings from Last 10 Encounters:  10/22/13 126 lb (57.153 kg)  10/06/13 130 lb 8.2 oz (59.2 kg)  09/13/13 147 lb (66.679 kg)  08/16/13 147 lb (66.679 kg)  08/08/13 147 lb (66.679 kg)  04/22/13 145 lb (65.772 kg)  03/20/13 151 lb 3.2 oz (68.584 kg)  03/16/13 143 lb (64.864 kg)  03/07/13 142 lb 3.2 oz (64.5 kg)  03/07/13 142 lb 3.2 oz (64.5 kg)    BMI:  There is no weight on file to calculate BMI.  BMI last month was 18.5 kg/(m^2)   Estimated Nutritional Needs: Kcal: 25-30 kcal/kg Protein: > 1 gram protein/kg Fluid: 1  ml/kcal  Diet Order: General Pt is also offered choice of unit snacks mid-morning and mid-afternoon.  Pt is eating as desired.   Lab results and medications reviewed. Alk phos, lipase, and AST/ALT elevated. Getting multivitamin, creon, thiamine, and imodium.   Carlis Stable MS, [place], LDN [phone] Pager [phone] Weekend/After Hours Pager

## 2013-11-07 NOTE — Progress Notes (Signed)
Adult Psychoeducational Group Note  Date:  11/07/2013 Time:  10:24 PM  Group Topic/Focus:  Wrap-Up Group:   The focus of this group is to help patients review their daily goal of treatment and discuss progress on daily workbooks.  Participation Level:  Active  Participation Quality:  Appropriate  Affect:  Appropriate  Cognitive:  Alert  Insight: Appropriate  Engagement in Group:  Engaged  Modes of Intervention:  Activity  Additional Comments:  PT came to group and decide to sing a song while in group   Jaisha Villacres, [place] 11/07/2013, 10:24 PM

## 2013-11-07 NOTE — Progress Notes (Signed)
[facility] MD Progress Note  11/07/2013 4:21 PM [name]  MRN:  [id number] Subjective:  [name] states he is not sure what to do. States that if her GF had the abortion because it was recommended due to her age (17) and her medical conditions he can accept if but if it was her decision (because of convenience) he will have a hard time with it. States that after all without a baby she would probably not be coming to this ares. States the uncertainty is creating more anxiety (asked for medications to help him deal) Diagnosis:   DSM5: Schizophrenia Disorders:  denies Obsessive-Compulsive Disorders:  denies Trauma-Stressor Disorders:  denies Substance/Addictive Disorders:  Alcohol Related Disorder - Severe (303.90) Depressive Disorders:  Major Depressive Disorder - Severe (296.23) Total Time spent with patient: 30 minutes  Axis I: Substance Induced Mood Disorder  ADL's:  Intact  Sleep: Poor  Appetite:  Fair  Suicidal Ideation:  Plan:  denies Intent:  denies Means:  denies Homicidal Ideation:  Plan:  denies Intent:  denies Means:  denies AEB (as evidenced by):  Psychiatric Specialty Exam: Physical Exam  Review of Systems  Constitutional: Positive for malaise/fatigue.  HENT: Negative.   Eyes: Negative.   Respiratory: Negative.   Cardiovascular: Negative.   Gastrointestinal: Negative.   Genitourinary: Negative.   Musculoskeletal: Negative.   Skin: Negative.   Neurological: Positive for weakness.  Endo/Heme/Allergies: Negative.   Psychiatric/Behavioral: Positive for depression and substance abuse. The patient is nervous/anxious and has insomnia.     Blood pressure 125/92, pulse 105, temperature 98.1 F (36.7 C), temperature source Oral, resp. rate 16.There is no weight on file to calculate BMI.  General Appearance: Fairly Groomed  Engineer, water::  Fair  Speech:  Clear and Coherent and Slow  Volume:  Decreased  Mood:  Anxious and Depressed  Affect:  Restricted  Thought  Process:  Coherent and Goal Directed  Orientation:  Full (Time, Place, and Person)  Thought Content:  symptoms, worries, concerns  Suicidal Thoughts:  No  Homicidal Thoughts:  No  Memory:  Immediate;   Fair Recent;   Fair Remote;   Fair  Judgement:  Fair  Insight:  Present and Shallow  Psychomotor Activity:  Restlessness  Concentration:  Fair  Recall:  [employer] of Knowledge:NA  Language: Fair  Akathisia:  No  Handed:    AIMS (if indicated):     Assets:  Desire for Improvement  Sleep:  Number of Hours: 3   Musculoskeletal: Strength & Muscle Tone: within normal limits Gait & Station: normal Patient leans: N/A  Current Medications: Current Facility-Administered Medications  Medication Dose Route Frequency Provider Last Rate Last Dose  . albuterol (PROVENTIL HFA;VENTOLIN HFA) 108 (90 BASE) MCG/ACT inhaler 1-2 puff  1-2 puff Inhalation Q6H PRN Laverle Hobby, PA-C      . alum & mag hydroxide-simeth (MAALOX/MYLANTA) 200-200-20 MG/5ML suspension 30 mL  30 mL Oral Q4H PRN Laverle Hobby, PA-C   30 mL at 11/07/13 1201  . feeding supplement (ENSURE COMPLETE) (ENSURE COMPLETE) liquid 237 mL  237 mL Oral TID BM Toribio Harbour, RD      . hydrOXYzine (ATARAX/VISTARIL) tablet 25 mg  25 mg Oral Q6H PRN Laverle Hobby, PA-C      . lactulose (CHRONULAC) 10 GM/15ML solution 10 g  10 g Oral Daily Laverle Hobby, PA-C   10 g at 11/07/13 0803  . lipase/protease/amylase (CREON-12/PANCREASE) capsule 1 capsule  1 capsule Oral TID WC Laverle Hobby,  PA-C   1 capsule at 11/07/13 1201  . loperamide (IMODIUM) capsule 2-4 mg  2-4 mg Oral PRN Laverle Hobby, PA-C      . LORazepam (ATIVAN) tablet 1 mg  1 mg Oral Q8H PRN Laverle Hobby, PA-C   1 mg at 11/06/13 0452  . LORazepam (ATIVAN) tablet 1 mg  1 mg Oral TID Nicholaus Bloom, MD   1 mg at 11/07/13 1201   Followed by  . [START ON 11/08/2013] LORazepam (ATIVAN) tablet 1 mg  1 mg Oral BID Nicholaus Bloom, MD       Followed by  . [START ON 11/09/2013]  LORazepam (ATIVAN) tablet 1 mg  1 mg Oral Daily Nicholaus Bloom, MD      . magnesium hydroxide (MILK OF MAGNESIA) suspension 30 mL  30 mL Oral Daily PRN Laverle Hobby, PA-C      . metoprolol (LOPRESSOR) tablet 50 mg  50 mg Oral BID Laverle Hobby, PA-C   50 mg at 11/07/13 3818  . multivitamin with minerals tablet 1 tablet  1 tablet Oral Daily Laverle Hobby, PA-C   1 tablet at 11/07/13 0804  . nicotine (NICODERM CQ - dosed in mg/24 hours) patch 14 mg  14 mg Transdermal Daily Laverle Hobby, PA-C   14 mg at 11/07/13 0805  . ondansetron (ZOFRAN-ODT) disintegrating tablet 4 mg  4 mg Oral Q6H PRN Laverle Hobby, PA-C      . OxyCODONE (OXYCONTIN) 12 hr tablet 15 mg  15 mg Oral Q12H Laverle Hobby, PA-C   15 mg at 11/07/13 0804  . pantoprazole (PROTONIX) EC tablet 40 mg  40 mg Oral Daily Laverle Hobby, PA-C   40 mg at 11/07/13 2993  . QUEtiapine (SEROQUEL) tablet 100 mg  100 mg Oral QHS Laverle Hobby, PA-C   100 mg at 11/06/13 2221  . QUEtiapine (SEROQUEL) tablet 25 mg  25 mg Oral BID Nicholaus Bloom, MD      . sertraline (ZOLOFT) tablet 50 mg  50 mg Oral Daily Nicholaus Bloom, MD   50 mg at 11/07/13 1439  . simethicone (MYLICON) chewable tablet 160 mg  160 mg Oral Q6H PRN Nicholaus Bloom, MD   160 mg at 11/07/13 1439  . thiamine (B-1) injection 100 mg  100 mg Intramuscular Once Spencer E Simon, PA-C      . thiamine (VITAMIN B-1) tablet 100 mg  100 mg Oral Daily Laverle Hobby, PA-C   100 mg at 11/07/13 7169    Lab Results:  Results for orders placed during the hospital encounter of 11/05/13 (from the past 48 hour(s))  HEPATIC FUNCTION PANEL     Status: Abnormal   Collection Time    11/06/13  6:29 AM      Result Value Ref Range   Total Protein 7.8  6.0 - 8.3 g/dL   Albumin 3.4 (*) 3.5 - 5.2 g/dL   AST 120 (*) 0 - 37 U/L   ALT 69 (*) 0 - 53 U/L   Alkaline Phosphatase 143 (*) 39 - 117 U/L   Total Bilirubin 0.7  0.3 - 1.2 mg/dL   Bilirubin, Direct 0.3  0.0 - 0.3 mg/dL   Indirect Bilirubin 0.4   0.3 - 0.9 mg/dL   Comment: Performed at [place], BLOOD     Status: Abnormal   Collection Time    11/06/13  6:29 AM      Result  Value Ref Range   Lipase 144 (*) 11 - 59 U/L   Comment: Performed at [facility]    Physical Findings: AIMS: Facial and Oral Movements Muscles of Facial Expression: None, normal Lips and Perioral Area: None, normal Jaw: None, normal Tongue: None, normal,Extremity Movements Upper (arms, wrists, hands, fingers): None, normal Lower (legs, knees, ankles, toes): None, normal, Trunk Movements Neck, shoulders, hips: None, normal, Overall Severity Severity of abnormal movements (highest score from questions above): None, normal Incapacitation due to abnormal movements: None, normal Patient's awareness of abnormal movements (rate only patient's report): No Awareness, Dental Status Current problems with teeth and/or dentures?: No Does patient usually wear dentures?: No  CIWA:  CIWA-Ar Total: 0 COWS:     Treatment Plan Summary: Daily contact with patient to assess and evaluate symptoms and progress in treatment Medication management  Plan: Supportive approach/coping skills/relapse prevention           CBT;mindfulness;stress management,           i.e. Explore strategies to handle the anxiety and not have to depend on taking a pill to do it  Medical Decision Making Problem Points:  Review of psycho-social stressors (1) Data Points:  Review of medication regiment & side effects (2)  I certify that inpatient services furnished can reasonably be expected to improve the patient's condition.   Yuliana Vandrunen A 11/07/2013, 4:21 PM

## 2013-11-07 NOTE — ED Provider Notes (Signed)
Medical screening examination/treatment/procedure(s) were conducted as a shared visit with non-physician practitioner(s) and myself.  I personally evaluated the patient during the encounter.  Pt with hx depression, c/o worsening depression and thoughts of suicide w plan. Shortly after arrival, pt attempts to leave. Pt encouraged to stay, ivc papers filled out. Labs. Inpatient psych placement/tx per psych team.    Mirna Mires, MD 11/07/13 [phone]

## 2013-11-07 NOTE — BHH Group Notes (Signed)
0900 nursing orientation group    The focus of this group is to educate the patient on the purpose and policies of crisis stabilization and provide a format to answer questions about their admission.  The group details unit policies and expectations of patients while admitted.  Pt was an active participant and appropriate in sharing his goal for today,"think about myself and the direction in which I am going"

## 2013-11-07 NOTE — Progress Notes (Signed)
D:Pt reports feeling sad this afternoon and states that he has cried over an hour because his girlfriend had an abortion. Pt was in the hospital a few weeks ago and stated that his girlfriend lost a baby. When writer asked about the situation, he said that that was another girlfriend. Pt has c/o heartburn and gas.  A:Offered support, encouragement and 15 minute checks. Gave medications as ordered.  R:Pt denies si and hi. Safety maintained on the unit.

## 2013-11-07 NOTE — BHH Group Notes (Signed)
[place] LCSW Group Therapy  11/07/2013 3:19 PM  Type of Therapy:  Group Therapy  Participation Level:  Did Not Attend-pt meeting with MD during group time.   Smart, Rakesha Dalporto LCSWA  11/07/2013, 3:19 PM

## 2013-11-08 MED ORDER — SERTRALINE HCL 50 MG PO TABS: 50.0000 mg | ORAL_TABLET | Freq: Every day | ORAL | Status: DC

## 2013-11-08 MED ORDER — ADULT MULTIVITAMIN W/MINERALS CH: 1.0000 | ORAL_TABLET | Freq: Every day | ORAL | Status: DC

## 2013-11-08 MED ORDER — OXYCODONE-ACETAMINOPHEN 5-325 MG PO TABS: 2.0000 | ORAL_TABLET | Freq: Four times a day (QID) | ORAL | Status: DC | PRN

## 2013-11-08 MED ORDER — ALBUTEROL SULFATE HFA 108 (90 BASE) MCG/ACT IN AERS: 1.0000 | INHALATION_SPRAY | Freq: Four times a day (QID) | RESPIRATORY_TRACT | Status: DC | PRN

## 2013-11-08 MED ORDER — QUETIAPINE FUMARATE 100 MG PO TABS: 100.0000 mg | ORAL_TABLET | Freq: Every day | ORAL | Status: DC

## 2013-11-08 MED ORDER — SERTRALINE HCL 25 MG PO TABS: 75.0000 mg | ORAL_TABLET | Freq: Every day | ORAL | Status: DC

## 2013-11-08 MED ORDER — QUETIAPINE FUMARATE 25 MG PO TABS: 25.0000 mg | ORAL_TABLET | Freq: Two times a day (BID) | ORAL | Status: DC

## 2013-11-08 MED ORDER — ONDANSETRON 4 MG PO TBDP: 4.0000 mg | ORAL_TABLET | Freq: Three times a day (TID) | ORAL | Status: DC | PRN

## 2013-11-08 MED ORDER — METOPROLOL TARTRATE 50 MG PO TABS: 50.0000 mg | ORAL_TABLET | Freq: Two times a day (BID) | ORAL | Status: DC

## 2013-11-08 MED ORDER — PANTOPRAZOLE SODIUM 40 MG PO TBEC: 40.0000 mg | DELAYED_RELEASE_TABLET | Freq: Every day | ORAL | Status: DC

## 2013-11-08 MED ORDER — LACTULOSE 10 GM/15ML PO SOLN: 10.0000 g | Freq: Every day | ORAL | Status: DC

## 2013-11-08 MED ORDER — LORAZEPAM 1 MG PO TABS: 1.0000 mg | ORAL_TABLET | Freq: Three times a day (TID) | ORAL | Status: DC | PRN

## 2013-11-08 MED ORDER — PANCRELIPASE (LIP-PROT-AMYL) 12000-38000 UNITS PO CPEP: 1.0000 | ORAL_CAPSULE | Freq: Three times a day (TID) | ORAL | Status: DC

## 2013-11-08 MED ORDER — SERTRALINE HCL 25 MG PO TABS
75.0000 mg | ORAL_TABLET | Freq: Every day | ORAL | Status: DC
  Administered 2013-11-09: 75 mg via ORAL
  Filled 2013-11-08: qty 3
  Filled 2013-11-08 (×2): qty 42

## 2013-11-08 MED ORDER — NICOTINE 14 MG/24HR TD PT24: 14.0000 mg | MEDICATED_PATCH | Freq: Every day | TRANSDERMAL | Status: DC

## 2013-11-08 MED ORDER — LANSOPRAZOLE 15 MG PO CPDR: 15.0000 mg | DELAYED_RELEASE_CAPSULE | Freq: Two times a day (BID) | ORAL | Status: DC | PRN
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
## 2013-11-08 NOTE — Progress Notes (Addendum)
[name] Adult Case Management Discharge Plan :  Will you be returning to the same living situation after discharge: Yes,  home with mother At discharge, do you have transportation home?:Yes,  bus pass in chart. Pt d/c rescheduled for Saturday per Dr. Sabra Heck. 11/08/2013 3:11 PM  Do you have the ability to pay for your medications:Yes,  mental health  Release of information consent forms completed and submitted to Medical Records by CSW.  Patient to Follow up at: Follow-up Information   Follow up with [facility]. (Walk in between 8am-9am Monday through Friday for hospital follow-up/medication management/assessment for therapy services. )    Contact information:   [address], [place] [address] Phone: [phone] Fax: [phone]      Patient denies SI/HI:   Yes,  during group/self report.     Safety Planning and Suicide Prevention discussed:  Yes,  SPE completed with pt. Contact attempts made with pt's mother. SPI pamphlet provided to pt and he was encouraged to share inforamation with support network, ask questions, and talk about any concerns relating to SPE.  Smart, [name] LCSWA  11/08/2013, 11:08 AM

## 2013-11-08 NOTE — Discharge Instructions (Signed)
Finding Treatment for Alcohol and Drug Addiction It can be hard to find the right place to get professional treatment. Here are some important things to consider:  There are different types of treatment to choose from.  Some programs are live-in (residential) while others are not (outpatient). Sometimes a combination is offered.  No single type of program is right for everyone.  Most treatment programs involve a combination of education, counseling, and a 12-step, spiritually-based approach.  There are non-spiritually based programs (not 12-step).  Some treatment programs are government sponsored. They are geared for patients without private insurance.  Treatment programs can vary in many respects such as:  Cost and types of insurance accepted.  Types of on-site medical services offered.  Length of stay, setting, and size.  Overall philosophy of treatment. A person may need specialized treatment or have needs not addressed by all programs. For example, adolescents need treatment appropriate for their age. Other people have secondary disorders that must be managed as well. Secondary conditions can include mental illness, such as depression or diabetes. Often, a period of detoxification from alcohol or drugs is needed. This requires medical supervision and not all programs offer this. THINGS TO CONSIDER WHEN SELECTING A TREATMENT PROGRAM   Is the program certified by the appropriate government agency? Even private programs must be certified and employ certified professionals.  Does the program accept your insurance? If not, can a payment plan be set up?  Is the facility clean, organized, and well run? Do they allow you to speak with graduates who can share their treatment experience with you? Can you tour the facility? Can you meet with staff?  Does the program meet the full range of individual needs?  Does the treatment program address sexual orientation and physical disabilities?  Do they provide age, gender, and culturally appropriate treatment services?  Is treatment available in languages other than English?  Is long-term aftercare support or guidance encouraged and provided?  Is assessment of an individual's treatment plan ongoing to ensure it meets changing needs?  Does the program use strategies to encourage reluctant patients to remain in treatment long enough to increase the likelihood of success?  Does the program offer counseling (individual or group) and other behavioral therapies?  Does the program offer medicine as part of the treatment regimen, if needed?  Is there ongoing monitoring of possible relapse? Is there a defined relapse prevention program? Are services or referrals offered to family members to ensure they understand addiction and the recovery process? This would help them support the recovering individual.  Are 12-step meetings held at the center or is transport available for patients to attend outside meetings? In countries outside of the [place], see local directories for contact information for services in your area. Document Released: 03/24/2005 Document Revised: 07/18/2011 Document Reviewed: 10/04/2007 ExitCare Patient Information 2015 [employer]. This information is not intended to replace advice given to you by your health care provider. Make sure you discuss any questions you have with your health care provider.  

## 2013-11-08 NOTE — Progress Notes (Addendum)
[facility] MD Progress Note  11/08/2013 1:54 PM [name]  MRN:  [id number] Subjective:  States that he is not feeling well today. He hardly slept, he is feeling down anxious, worried. States he cant help but worry. Still overwhelmed with all the uncertainty. States he knows he is better off focusing on himself on getting healthy. Admits that he knows it would be better if he officially brakes up with his girlfriend. Has a hard time thinking he is not going to be with her.  Diagnosis:   DSM5: Schizophrenia Disorders:  none Obsessive-Compulsive Disorders:  none Trauma-Stressor Disorders:  none Substance/Addictive Disorders:  Alcohol Related Disorder - Severe (303.90) Depressive Disorders:  Major Depressive Disorder - Moderate (296.22) Total Time spent with patient: 30 minutes  Axis I: Substance Induced Mood Disorder  ADL's:  Intact  Sleep: Poor  Appetite:  Fair  Suicidal Ideation:  Plan:  denies Intent:  denies Means:  denies Homicidal Ideation:  Plan:  denies Intent:  denies Means:  denies AEB (as evidenced by):  Psychiatric Specialty Exam: Physical Exam  Review of Systems  Constitutional: Positive for malaise/fatigue.  HENT: Negative.   Eyes: Negative.   Respiratory: Negative.   Cardiovascular: Negative.   Gastrointestinal: Negative.   Genitourinary: Negative.   Musculoskeletal: Negative.   Skin: Negative.   Neurological: Positive for weakness.  Endo/Heme/Allergies: Negative.   Psychiatric/Behavioral: Positive for depression and substance abuse. The patient has insomnia.     Blood pressure 133/95, pulse 103, temperature 98.2 F (36.8 C), temperature source Oral, resp. rate 18, SpO2 100.00%.There is no weight on file to calculate BMI.  General Appearance: Fairly Groomed  Engineer, water::  Minimal  Speech:  Clear and Coherent and Slow  Volume:  Decreased  Mood:  Anxious and Depressed  Affect:  Restricted  Thought Process:  Coherent and Goal Directed  Orientation:   Full (Time, Place, and Person)  Thought Content:  symptoms, worries, concerns  Suicidal Thoughts:  No  Homicidal Thoughts:  No  Memory:  Immediate;   Fair Recent;   Fair Remote;   Fair  Judgement:  Fair  Insight:  Shallow  Psychomotor Activity:  Restlessness  Concentration:  Fair  Recall:  [employer] of Knowledge:NA  Language: Fair  Akathisia:  No  Handed:    AIMS (if indicated):     Assets:  Desire for Improvement Housing Social Support  Sleep:  Number of Hours: 3   Musculoskeletal: Strength & Muscle Tone: within normal limits Gait & Station: normal Patient leans: N/A  Current Medications: Current Facility-Administered Medications  Medication Dose Route Frequency Provider Last Rate Last Dose  . albuterol (PROVENTIL HFA;VENTOLIN HFA) 108 (90 BASE) MCG/ACT inhaler 1-2 puff  1-2 puff Inhalation Q6H PRN Laverle Hobby, PA-C      . alum & mag hydroxide-simeth (MAALOX/MYLANTA) 200-200-20 MG/5ML suspension 30 mL  30 mL Oral Q4H PRN Laverle Hobby, PA-C   30 mL at 11/07/13 1201  . feeding supplement (ENSURE COMPLETE) (ENSURE COMPLETE) liquid 237 mL  237 mL Oral TID BM Toribio Harbour, RD   237 mL at 11/08/13 1017  . hydrOXYzine (ATARAX/VISTARIL) tablet 25 mg  25 mg Oral Q6H PRN Laverle Hobby, PA-C   25 mg at 11/07/13 2216  . lactulose (CHRONULAC) 10 GM/15ML solution 10 g  10 g Oral Daily Laverle Hobby, PA-C   10 g at 11/08/13 0810  . lipase/protease/amylase (CREON-12/PANCREASE) capsule 1 capsule  1 capsule Oral TID WC Laverle Hobby, PA-C  1 capsule at 11/08/13 1147  . loperamide (IMODIUM) capsule 2-4 mg  2-4 mg Oral PRN Laverle Hobby, PA-C      . LORazepam (ATIVAN) tablet 1 mg  1 mg Oral Q8H PRN Laverle Hobby, PA-C   1 mg at 11/07/13 2205  . LORazepam (ATIVAN) tablet 1 mg  1 mg Oral BID Nicholaus Bloom, MD   1 mg at 11/08/13 5456   Followed by  . [START ON 11/09/2013] LORazepam (ATIVAN) tablet 1 mg  1 mg Oral Daily Nicholaus Bloom, MD      . magnesium hydroxide (MILK OF  MAGNESIA) suspension 30 mL  30 mL Oral Daily PRN Laverle Hobby, PA-C      . metoprolol (LOPRESSOR) tablet 50 mg  50 mg Oral BID Laverle Hobby, PA-C   50 mg at 11/08/13 2563  . multivitamin with minerals tablet 1 tablet  1 tablet Oral Daily Laverle Hobby, PA-C   1 tablet at 11/08/13 0810  . nicotine (NICODERM CQ - dosed in mg/24 hours) patch 14 mg  14 mg Transdermal Daily Laverle Hobby, PA-C   14 mg at 11/08/13 8937  . ondansetron (ZOFRAN-ODT) disintegrating tablet 4 mg  4 mg Oral Q6H PRN Laverle Hobby, PA-C      . OxyCODONE (OXYCONTIN) 12 hr tablet 15 mg  15 mg Oral Q12H Laverle Hobby, PA-C   15 mg at 11/08/13 3428  . pantoprazole (PROTONIX) EC tablet 40 mg  40 mg Oral Daily Laverle Hobby, PA-C   40 mg at 11/08/13 7681  . QUEtiapine (SEROQUEL) tablet 100 mg  100 mg Oral QHS Laverle Hobby, PA-C   100 mg at 11/07/13 2205  . QUEtiapine (SEROQUEL) tablet 25 mg  25 mg Oral BID Nicholaus Bloom, MD   25 mg at 11/08/13 0810  . sertraline (ZOLOFT) tablet 50 mg  50 mg Oral Daily Nicholaus Bloom, MD   50 mg at 11/08/13 1572  . simethicone (MYLICON) chewable tablet 160 mg  160 mg Oral Q6H PRN Nicholaus Bloom, MD   160 mg at 11/07/13 2006  . thiamine (B-1) injection 100 mg  100 mg Intramuscular Once [employer], PA-C      . thiamine (VITAMIN B-1) tablet 100 mg  100 mg Oral Daily Laverle Hobby, PA-C   100 mg at 11/08/13 6203    Lab Results: No results found for this or any previous visit (from the past 48 hour(s)).  Physical Findings: AIMS: Facial and Oral Movements Muscles of Facial Expression: None, normal Lips and Perioral Area: None, normal Jaw: None, normal Tongue: None, normal,Extremity Movements Upper (arms, wrists, hands, fingers): None, normal Lower (legs, knees, ankles, toes): None, normal, Trunk Movements Neck, shoulders, hips: None, normal, Overall Severity Severity of abnormal movements (highest score from questions above): None, normal Incapacitation due to abnormal  movements: None, normal Patient's awareness of abnormal movements (rate only patient's report): No Awareness, Dental Status Current problems with teeth and/or dentures?: No Does patient usually wear dentures?: No  CIWA:  CIWA-Ar Total: 5 COWS:     Treatment Plan Summary: Daily contact with patient to assess and evaluate symptoms and progress in treatment Medication management  Plan: Supportive approach/coping skills/relapse prevention           CBT;mindfulness/stress management           Optmize treatment with psychotropics            Increase the Zoloft to 75  mg dily Medical Decision Making Problem Points:  Review of psycho-social stressors (1) Data Points:  Review of medication regiment & side effects (2)  I certify that inpatient services furnished can reasonably be expected to improve the patient's condition.   Demetry Bendickson A 11/08/2013, 1:54 PM

## 2013-11-08 NOTE — BHH Group Notes (Signed)
[place] LCSW Group Therapy  11/08/2013 3:12 PM  Type of Therapy:  Group Therapy  Participation Level:  Minimal  Participation Quality:  Attentive  Affect:  Depressed and Flat  Cognitive:  Oriented  Insight:  Improving  Engagement in Therapy:  Improving  Modes of Intervention:  Confrontation, Discussion, Education, Exploration, Problem-solving, Rapport Building, Socialization and Support  Summary of Progress/Problems: Feelings around Relapse. Group members discussed the meaning of relapse and shared personal stories of relapse, how it affected them and others, and how they perceived themselves during this time. Group members were encouraged to identify triggers, warning signs and coping skills used when facing the possibility of relapse. Social supports were discussed and explored in detail. Post Acute Withdrawal Syndrome (handout provided) was introduced and examined. [name] were encouraged to ask questions, talk about key points associated with PAWS, and process this information in terms of relapse prevention. [name] was attentive throughout today's therapy group. He did not actively participate in group discussion but remainted attentive as others participated. [name] followed along as CSW reviewed PAWS information and discussed safety planning.    Smart, Alekai Pocock LCSWA  11/08/2013, 3:12 PM

## 2013-11-08 NOTE — BHH Suicide Risk Assessment (Signed)
[place] INPATIENT:  Family/Significant Other Suicide Prevention Education  Suicide Prevention Education:  Contact Attempts: Jeffree Cazeau (pt's mother) [phone] has been identified by the patient as the family member/significant other with whom the patient will be residing, and identified as the person(s) who will aid the patient in the event of a mental health crisis.  With written consent from the patient, two attempts were made to provide suicide prevention education, prior to and/or following the patient's discharge.  We were unsuccessful in providing suicide prevention education.  A suicide education pamphlet was given to the patient to share with family/significant other.  Date and time of first attempt: 11/07/13 (no answer) Date and time of second attempt: 11/08/13 (generic voicemail left requesting call back)  Smart, Aileena Iglesia LCSWA  11/08/2013, 11:06 AM

## 2013-11-08 NOTE — BHH Group Notes (Signed)
Adult Psychoeducational Group Note  Date:  11/08/2013 Time:  11:53 AM  Group Topic/Focus:  Healthy Communication:   The focus of this group is to discuss communication, barriers to communication, as well as healthy ways to communicate with others.  Participation Level:  Active  Participation Quality:  Attentive  Affect:  Appropriate  Cognitive:  Appropriate and Oriented  Insight: Good  Engagement in Group:  Engaged and Supportive  Modes of Intervention:  Discussion, Education and Socialization  Additional Comments:  He provided that eye contact and face-to-face discussions are good ways to have effective communication. He felt that being face-to-face with a person he is able to "read" their expressions and hear their tone of voice during conversation. Therefore, he provided the face-to-face for the "Do's" list under "Do's and Don'ts of Communication".   Hettie Holstein D 11/08/2013, 11:53 AM

## 2013-11-08 NOTE — BHH Group Notes (Signed)
[facility] LCSW Aftercare Discharge Planning Group Note   11/08/2013 9:45 AM  Participation Quality:  Minimal   Mood/Affect:  Flat  Depression Rating:  6  Anxiety Rating:  6  Thoughts of Suicide:  No Will you contract for safety?   NA  Current AVH:  No  Plan for Discharge/Comments:  Pt planning to return home with his mother today. He will go to [facility] for med management and therapy services. Pt provided with stamps per his request.   Transportation Means: friend/bus pass-in chart  Supports: mother/some friend supports  Proofreader, Research officer, trade union

## 2013-11-09 DIAGNOSIS — F1020 Alcohol dependence, uncomplicated: Secondary | ICD-10-CM

## 2013-11-09 DIAGNOSIS — F322 Major depressive disorder, single episode, severe without psychotic features: Secondary | ICD-10-CM

## 2013-11-09 NOTE — Progress Notes (Signed)
Psychoeducational Group Note  Date:  11/09/2013 Time:  0945 am  Group Topic/Focus:  Identifying Needs:   The focus of this group is to help patients identify their personal needs that have been historically problematic and identify healthy behaviors to address their needs.  Participation Level:  Active  Participation Quality:  Appropriate  Affect:  Appropriate  Cognitive:  Alert  Insight:  Developing/Improving  Engagement in Group:  Developing/Improving  Additional Comments:    [name] 11/09/2013,10:26 AM

## 2013-11-09 NOTE — BHH Group Notes (Signed)
[place] Group Notes:  (Clinical Social Work)  11/09/2013     10-11AM  Summary of Progress/Problems:   The main focus of today's process group was for the patient to identify ways in which they have in the past sabotaged their own recovery. Motivational Interviewing and a worksheet were utilized to help patients explore in depth the perceived benefits and costs of their substance use, as well as the potential benefits and costs of stopping.  The Stages of Change were explained using a handout, with an emphasis on making plans to deal with sabotaging behaviors proactively.  The patient expressed that his self-sabotaging behavior is drinking until he passes out, because he has such difficulties sleeping while thinking about his problems and the alcohol makes him stopping thinking about his problems.  He talked at length about his 2-week hospitalization at [facility] and 5-day hospitalization at [facility] last year after a serious suicide attempt.  He admitted that he went off his medications Seroquel and Zoloft which seemed to be working because he met a woman he wanted to date and was afraid of what she would say or think about his depression and medication.  He is committed now to telling her about it and hoping she understands  Type of Therapy:  Group Therapy - Process   Participation Level:  Active  Participation Quality:  Monopolizing and Redirectable  Affect:  Blunted  Cognitive:  Appropriate and Oriented  Insight:  Developing/Improving  Engagement in Therapy:  Engaged  Modes of Intervention:  Education, Support and Processing, Motivational Interviewing  Selmer Dominion, LCSW 11/09/2013, 12:31 PM

## 2013-11-09 NOTE — BHH Suicide Risk Assessment (Signed)
Suicide Risk Assessment  Discharge Assessment     Demographic Factors:  Male  Total Time spent with patient: 45 minutes  Psychiatric Specialty Exam:     Blood pressure 132/86, pulse 97, temperature 97.3 F (36.3 C), temperature source Oral, resp. rate 16, SpO2 100.00%.There is no weight on file to calculate BMI.  General Appearance: Fairly Groomed  Engineer, water::  Fair  Speech:  Clear and Coherent  Volume:  Decreased  Mood:  Anxious and worried  Affect:  Restricted  Thought Process:  Coherent and Goal Directed  Orientation:  Full (Time, Place, and Person)  Thought Content:  plans as he mcoves on, relapse prevention plan  Suicidal Thoughts:  No  Homicidal Thoughts:  No  Memory:  Immediate;   Fair Recent;   Fair Remote;   Fair  Judgement:  Fair  Insight:  Present  Psychomotor Activity:  Normal  Concentration:  Fair  Recall:  [employer] of Knowledge:NA  Language: Fair  Akathisia:  No  Handed:    AIMS (if indicated):     Assets:  Desire for Improvement Housing Social Support  Sleep:  Number of Hours: 5.75    Musculoskeletal: Strength & Muscle Tone: within normal limits Gait & Station: normal Patient leans: N/A   Mental Status Per Nursing Assessment::   On Admission:  NA  Current Mental Status by Physician: In full contact with reality. There are no S/S of withdrawal. There are no active SI plans or intent. Understands there are some decisions he has to made in terms of the relationship with his GF but states he is ready to address it. Will pursue [facility] if he feels he needs rehab once he gets back home   Loss Factors: Loss of significant relationship  Historical Factors: NA  Risk Reduction Factors:   Sense of responsibility to family, Living with another person, especially a relative and Positive social support  Continued Clinical Symptoms:  Depression:   Comorbid alcohol abuse/dependence Alcohol/Substance Abuse/Dependencies  Cognitive Features That  Contribute To Risk:  Closed-mindedness Polarized thinking Thought constriction (tunnel vision)    Suicide Risk:  Minimal: No identifiable suicidal ideation.  Patients presenting with no risk factors but with morbid ruminations; may be classified as minimal risk based on the severity of the depressive symptoms  Discharge Diagnoses:   AXIS I:  Alcohol dependence, S/P alcohol withdrawal,Major Depressive Disorder AXIS II:  No diagnosis AXIS III:   Past Medical History  Diagnosis Date  . Hypertension   . Asthma   . Pancreatitis   . Cocaine abuse   . Depression   . H/O suicide attempt   . Heart murmur     "when he was little" (03/06/2013)  . Shortness of breath     "can happen at anytime" (03/06/2013)  . Anemia   . H/O hiatal hernia   . GERD (gastroesophageal reflux disease)   . Arthritis     "knees" (03/06/2013)  . Anxiety   . WPW (Wolff-Parkinson-White syndrome)     [name] 03/06/2013   AXIS IV:  other psychosocial or environmental problems AXIS V:  61-70 mild symptoms  Plan Of Care/Follow-up recommendations:  Activity:  as tolerated Diet:  regular Follow up outpatient basis Is patient on multiple antipsychotic therapies at discharge:  No   Has Patient had three or more failed trials of antipsychotic monotherapy by history:  No  Recommended Plan for Multiple Antipsychotic Therapies: NA    Berdine Rasmusson A 11/09/2013, 2:58 PM

## 2013-11-09 NOTE — Progress Notes (Signed)
D: Patient seen playing cards with peers on dayroom. Patient reports pain of 6/10 pointing to his left side of abdomen stated "it's pancreatic pain" requesting something for pain. Denied SI, AH/VH. Patient also reports indigestion and flatulence, requested and received po prn of mylanta and simethicone.   A: Patient offered support and encouragement. Encouraged to participate in groups and complied with medications and treatments. Every 15 minutes for safety.  R: Complied with medications. Will continue to monitor patient.

## 2013-11-09 NOTE — Progress Notes (Signed)
D   Pt reports fair sleep and appetite improving   He said he has a normal; energy level but his ability to pay attention is poor   He rates his depression and hopelessness at an 8  And said his withdrawal symptoms are craving and agitation   He denies suicidal ideation and plans to refrain from drinking after he is discharged   He is supposed to be discharged today   He worries about staying on his medications for financial reasons A   Verbal support given   Medications administered and effectiveness monitored   Q 15 min checks R   Pt is safe at present

## 2013-11-09 NOTE — Progress Notes (Signed)
Pt was discharged  Left with all belongings and follow up information   Pt denies suicidal and homicidal ideation and verbalized understanding of follow up information and medications

## 2013-11-13 NOTE — Progress Notes (Signed)
Patient Discharge Instructions:  After Visit Summary (AVS):   Faxed to:  11/13/13 Discharge Summary Note:   Faxed to:  11/13/13 Psychiatric Admission Assessment Note:   Faxed to:  11/13/13 Suicide Risk Assessment - Discharge Assessment:   Faxed to:  11/13/13 Faxed/Sent to the Next Level Care provider:  11/13/13 Faxed to [facility] @ [place], 11/13/2013, 2:34 PM

## 2013-11-17 ENCOUNTER — Inpatient Hospital Stay (HOSPITAL_COMMUNITY)
Admission: EM | Admit: 2013-11-17 | Discharge: 2013-11-26 | DRG: 438 | Disposition: A | Discharge status: Home / Self Care | Payer: Self-pay | Attending: Internal Medicine | Admitting: Internal Medicine

## 2013-11-17 ENCOUNTER — Encounter (HOSPITAL_COMMUNITY): Payer: Self-pay | Admitting: Emergency Medicine

## 2013-11-17 ENCOUNTER — Emergency Department (HOSPITAL_COMMUNITY): Payer: Self-pay

## 2013-11-17 DIAGNOSIS — K859 Acute pancreatitis without necrosis or infection, unspecified: Principal | ICD-10-CM | POA: Diagnosis present

## 2013-11-17 DIAGNOSIS — F332 Major depressive disorder, recurrent severe without psychotic features: Secondary | ICD-10-CM

## 2013-11-17 DIAGNOSIS — J45909 Unspecified asthma, uncomplicated: Secondary | ICD-10-CM | POA: Diagnosis present

## 2013-11-17 DIAGNOSIS — F1014 Alcohol abuse with alcohol-induced mood disorder: Secondary | ICD-10-CM

## 2013-11-17 DIAGNOSIS — F1010 Alcohol abuse, uncomplicated: Secondary | ICD-10-CM

## 2013-11-17 DIAGNOSIS — IMO0002 Reserved for concepts with insufficient information to code with codable children: Secondary | ICD-10-CM

## 2013-11-17 DIAGNOSIS — F17200 Nicotine dependence, unspecified, uncomplicated: Secondary | ICD-10-CM

## 2013-11-17 DIAGNOSIS — I10 Essential (primary) hypertension: Secondary | ICD-10-CM

## 2013-11-17 DIAGNOSIS — F3289 Other specified depressive episodes: Secondary | ICD-10-CM

## 2013-11-17 DIAGNOSIS — Z79899 Other long term (current) drug therapy: Secondary | ICD-10-CM

## 2013-11-17 DIAGNOSIS — F411 Generalized anxiety disorder: Secondary | ICD-10-CM | POA: Diagnosis present

## 2013-11-17 DIAGNOSIS — Z91013 Allergy to seafood: Secondary | ICD-10-CM

## 2013-11-17 DIAGNOSIS — F10988 Alcohol use, unspecified with other alcohol-induced disorder: Secondary | ICD-10-CM

## 2013-11-17 DIAGNOSIS — E872 Acidosis, unspecified: Secondary | ICD-10-CM

## 2013-11-17 DIAGNOSIS — K862 Cyst of pancreas: Secondary | ICD-10-CM

## 2013-11-17 DIAGNOSIS — E871 Hypo-osmolality and hyponatremia: Secondary | ICD-10-CM

## 2013-11-17 DIAGNOSIS — K7010 Alcoholic hepatitis without ascites: Secondary | ICD-10-CM | POA: Diagnosis present

## 2013-11-17 DIAGNOSIS — K860 Alcohol-induced chronic pancreatitis: Secondary | ICD-10-CM

## 2013-11-17 DIAGNOSIS — F1410 Cocaine abuse, uncomplicated: Secondary | ICD-10-CM | POA: Diagnosis present

## 2013-11-17 DIAGNOSIS — K852 Alcohol induced acute pancreatitis without necrosis or infection: Secondary | ICD-10-CM

## 2013-11-17 DIAGNOSIS — K861 Other chronic pancreatitis: Secondary | ICD-10-CM | POA: Diagnosis present

## 2013-11-17 DIAGNOSIS — K219 Gastro-esophageal reflux disease without esophagitis: Secondary | ICD-10-CM | POA: Diagnosis present

## 2013-11-17 DIAGNOSIS — R000 Tachycardia, unspecified: Secondary | ICD-10-CM | POA: Diagnosis present

## 2013-11-17 DIAGNOSIS — F1020 Alcohol dependence, uncomplicated: Secondary | ICD-10-CM | POA: Diagnosis present

## 2013-11-17 DIAGNOSIS — F329 Major depressive disorder, single episode, unspecified: Secondary | ICD-10-CM | POA: Diagnosis present

## 2013-11-17 DIAGNOSIS — R109 Unspecified abdominal pain: Secondary | ICD-10-CM

## 2013-11-17 DIAGNOSIS — Z8249 Family history of ischemic heart disease and other diseases of the circulatory system: Secondary | ICD-10-CM

## 2013-11-17 DIAGNOSIS — K863 Pseudocyst of pancreas: Secondary | ICD-10-CM

## 2013-11-17 DIAGNOSIS — Z888 Allergy status to other drugs, medicaments and biological substances status: Secondary | ICD-10-CM

## 2013-11-17 DIAGNOSIS — E41 Nutritional marasmus: Secondary | ICD-10-CM | POA: Diagnosis present

## 2013-11-17 DIAGNOSIS — I456 Pre-excitation syndrome: Secondary | ICD-10-CM

## 2013-11-17 DIAGNOSIS — E43 Unspecified severe protein-calorie malnutrition: Secondary | ICD-10-CM

## 2013-11-17 LAB — AMYLASE: AMYLASE: 94 U/L (ref 0–105)

## 2013-11-17 LAB — CBC WITH DIFFERENTIAL/PLATELET
Basophils Absolute: 0 10*3/uL (ref 0.0–0.1)
Basophils Relative: 0 % (ref 0–1)
EOS PCT: 1 % (ref 0–5)
Eosinophils Absolute: 0.1 10*3/uL (ref 0.0–0.7)
HEMATOCRIT: 41.6 % (ref 39.0–52.0)
HEMOGLOBIN: 13.9 g/dL (ref 13.0–17.0)
LYMPHS ABS: 3 10*3/uL (ref 0.7–4.0)
Lymphocytes Relative: 33 % (ref 12–46)
MCH: 31.8 pg (ref 26.0–34.0)
MCHC: 33.4 g/dL (ref 30.0–36.0)
MCV: 95.2 fL (ref 78.0–100.0)
Monocytes Absolute: 0.8 10*3/uL (ref 0.1–1.0)
Monocytes Relative: 9 % (ref 3–12)
NEUTROS PCT: 57 % (ref 43–77)
Neutro Abs: 5.1 10*3/uL (ref 1.7–7.7)
Platelets: 260 10*3/uL (ref 150–400)
RBC: 4.37 MIL/uL (ref 4.22–5.81)
RDW: 16.4 % — ABNORMAL HIGH (ref 11.5–15.5)
WBC: 9 10*3/uL (ref 4.0–10.5)

## 2013-11-17 LAB — COMPREHENSIVE METABOLIC PANEL
ALK PHOS: 173 U/L — AB (ref 39–117)
ALT: 92 U/L — ABNORMAL HIGH (ref 0–53)
ANION GAP: 24 — AB (ref 5–15)
AST: 135 U/L — ABNORMAL HIGH (ref 0–37)
Albumin: 4.2 g/dL (ref 3.5–5.2)
BUN: 12 mg/dL (ref 6–23)
CO2: 17 mEq/L — ABNORMAL LOW (ref 19–32)
CREATININE: 0.62 mg/dL (ref 0.50–1.35)
Calcium: 9.4 mg/dL (ref 8.4–10.5)
Chloride: 88 mEq/L — ABNORMAL LOW (ref 96–112)
GFR calc non Af Amer: >90 mL/min (ref >90)
GLUCOSE: 87 mg/dL (ref 70–99)
POTASSIUM: 4.1 meq/L (ref 3.7–5.3)
Sodium: 129 mEq/L — ABNORMAL LOW (ref 137–147)
TOTAL PROTEIN: 9.3 g/dL — AB (ref 6.0–8.3)
Total Bilirubin: 1.4 mg/dL — ABNORMAL HIGH (ref 0.3–1.2)

## 2013-11-17 LAB — LIPASE, BLOOD: Lipase: 80 U/L — ABNORMAL HIGH (ref 11–59)

## 2013-11-17 LAB — RAPID URINE DRUG SCREEN, HOSP PERFORMED
AMPHETAMINES: NOT DETECTED
Barbiturates: NOT DETECTED
Benzodiazepines: NOT DETECTED
Cocaine: NOT DETECTED
Opiates: NOT DETECTED
TETRAHYDROCANNABINOL: NOT DETECTED

## 2013-11-17 LAB — ETHANOL: Alcohol, Ethyl (B): 111 mg/dL — ABNORMAL HIGH (ref 0–11)

## 2013-11-17 MED ORDER — VITAMIN B-1 100 MG PO TABS
100.0000 mg | ORAL_TABLET | Freq: Every day | ORAL | Status: DC
  Administered 2013-11-17 – 2013-11-26 (×10): 100 mg via ORAL
  Filled 2013-11-17 (×10): qty 1

## 2013-11-17 MED ORDER — OXYCODONE HCL 5 MG PO TABS
5.0000 mg | ORAL_TABLET | ORAL | Status: DC | PRN
  Administered 2013-11-18 – 2013-11-19 (×4): 5 mg via ORAL
  Filled 2013-11-17 (×4): qty 1

## 2013-11-17 MED ORDER — ONDANSETRON HCL 4 MG PO TABS
4.0000 mg | ORAL_TABLET | Freq: Four times a day (QID) | ORAL | Status: DC | PRN
  Administered 2013-11-25: 4 mg via ORAL
  Filled 2013-11-17: qty 1

## 2013-11-17 MED ORDER — ADULT MULTIVITAMIN W/MINERALS CH
1.0000 | ORAL_TABLET | Freq: Every day | ORAL | Status: DC
  Administered 2013-11-18 – 2013-11-26 (×9): 1 via ORAL
  Filled 2013-11-17 (×9): qty 1

## 2013-11-17 MED ORDER — ONDANSETRON HCL 4 MG/2ML IJ SOLN
4.0000 mg | Freq: Once | INTRAMUSCULAR | Status: AC
  Administered 2013-11-17: 4 mg via INTRAVENOUS
  Filled 2013-11-17: qty 2

## 2013-11-17 MED ORDER — LORAZEPAM 1 MG PO TABS
1.0000 mg | ORAL_TABLET | Freq: Four times a day (QID) | ORAL | Status: AC | PRN
  Administered 2013-11-18 – 2013-11-20 (×3): 1 mg via ORAL
  Filled 2013-11-17 (×2): qty 1

## 2013-11-17 MED ORDER — LORAZEPAM 1 MG PO TABS
0.0000 mg | ORAL_TABLET | Freq: Two times a day (BID) | ORAL | Status: AC
  Administered 2013-11-20: 4 mg via ORAL
  Administered 2013-11-21: 1 mg via ORAL
  Filled 2013-11-17: qty 1
  Filled 2013-11-17: qty 4
  Filled 2013-11-17: qty 1
  Filled 2013-11-17: qty 2

## 2013-11-17 MED ORDER — PANTOPRAZOLE SODIUM 40 MG IV SOLR
40.0000 mg | Freq: Two times a day (BID) | INTRAVENOUS | Status: DC
  Administered 2013-11-17 – 2013-11-18 (×3): 40 mg via INTRAVENOUS
  Filled 2013-11-17 (×5): qty 40

## 2013-11-17 MED ORDER — QUETIAPINE FUMARATE 25 MG PO TABS
25.0000 mg | ORAL_TABLET | ORAL | Status: DC
  Administered 2013-11-18 – 2013-11-23 (×12): 25 mg via ORAL
  Filled 2013-11-17 (×15): qty 1

## 2013-11-17 MED ORDER — SODIUM CHLORIDE 0.9 % IV BOLUS (SEPSIS)
1000.0000 mL | Freq: Once | INTRAVENOUS | Status: AC
Start: 2013-11-17 — End: 2013-11-17
  Administered 2013-11-17: 1000 mL via INTRAVENOUS

## 2013-11-17 MED ORDER — SERTRALINE HCL 50 MG PO TABS
75.0000 mg | ORAL_TABLET | Freq: Every day | ORAL | Status: DC
  Administered 2013-11-17 – 2013-11-26 (×10): 75 mg via ORAL
  Filled 2013-11-17 (×10): qty 1

## 2013-11-17 MED ORDER — THIAMINE HCL 100 MG/ML IJ SOLN
Freq: Once | INTRAVENOUS | Status: AC
  Administered 2013-11-17: 23:00:00 via INTRAVENOUS
  Filled 2013-11-17: qty 1000

## 2013-11-17 MED ORDER — ACETAMINOPHEN 650 MG RE SUPP: 650.0000 mg | Freq: Four times a day (QID) | RECTAL | Status: DC | PRN

## 2013-11-17 MED ORDER — ENOXAPARIN SODIUM 40 MG/0.4ML ~~LOC~~ SOLN
40.0000 mg | SUBCUTANEOUS | Status: DC
  Administered 2013-11-17 – 2013-11-25 (×9): 40 mg via SUBCUTANEOUS
  Filled 2013-11-17 (×10): qty 0.4

## 2013-11-17 MED ORDER — METOPROLOL TARTRATE 1 MG/ML IV SOLN
2.5000 mg | INTRAVENOUS | Status: DC | PRN
Start: 2013-11-17 — End: 2013-11-26
  Administered 2013-11-23: 2.5 mg via INTRAVENOUS
  Filled 2013-11-17: qty 5

## 2013-11-17 MED ORDER — SODIUM CHLORIDE 0.9 % IV SOLN
INTRAVENOUS | Status: DC
  Administered 2013-11-17 – 2013-11-20 (×4): via INTRAVENOUS

## 2013-11-17 MED ORDER — LORAZEPAM 2 MG/ML IJ SOLN
1.0000 mg | Freq: Four times a day (QID) | INTRAMUSCULAR | Status: AC | PRN
  Administered 2013-11-19: 1 mg via INTRAVENOUS
  Filled 2013-11-17: qty 1

## 2013-11-17 MED ORDER — ZOLPIDEM TARTRATE 5 MG PO TABS
5.0000 mg | ORAL_TABLET | Freq: Every evening | ORAL | Status: DC | PRN
  Administered 2013-11-21: 5 mg via ORAL
  Filled 2013-11-17 (×2): qty 1

## 2013-11-17 MED ORDER — ACETAMINOPHEN 325 MG PO TABS
650.0000 mg | ORAL_TABLET | Freq: Four times a day (QID) | ORAL | Status: DC | PRN
  Administered 2013-11-19 – 2013-11-24 (×9): 650 mg via ORAL
  Filled 2013-11-17 (×9): qty 2

## 2013-11-17 MED ORDER — HYDROMORPHONE HCL PF 1 MG/ML IJ SOLN
2.0000 mg | INTRAMUSCULAR | Status: DC | PRN
  Administered 2013-11-18 – 2013-11-19 (×9): 2 mg via INTRAVENOUS
  Filled 2013-11-17 (×9): qty 2

## 2013-11-17 MED ORDER — METOPROLOL TARTRATE 50 MG PO TABS
50.0000 mg | ORAL_TABLET | Freq: Two times a day (BID) | ORAL | Status: DC
  Administered 2013-11-17 – 2013-11-26 (×18): 50 mg via ORAL
  Filled 2013-11-17 (×19): qty 1

## 2013-11-17 MED ORDER — ALBUTEROL SULFATE HFA 108 (90 BASE) MCG/ACT IN AERS: 1.0000 | INHALATION_SPRAY | Freq: Four times a day (QID) | RESPIRATORY_TRACT | Status: DC | PRN

## 2013-11-17 MED ORDER — PNEUMOCOCCAL VAC POLYVALENT 25 MCG/0.5ML IJ INJ
0.5000 mL | INJECTION | INTRAMUSCULAR | Status: DC
  Filled 2013-11-17: qty 0.5

## 2013-11-17 MED ORDER — NICOTINE 14 MG/24HR TD PT24
14.0000 mg | MEDICATED_PATCH | Freq: Every day | TRANSDERMAL | Status: DC
  Administered 2013-11-17 – 2013-11-26 (×10): 14 mg via TRANSDERMAL
  Filled 2013-11-17 (×10): qty 1

## 2013-11-17 MED ORDER — ALBUTEROL SULFATE (2.5 MG/3ML) 0.083% IN NEBU: 2.5000 mg | INHALATION_SOLUTION | Freq: Four times a day (QID) | RESPIRATORY_TRACT | Status: DC | PRN

## 2013-11-17 MED ORDER — QUETIAPINE FUMARATE 100 MG PO TABS
100.0000 mg | ORAL_TABLET | Freq: Every day | ORAL | Status: DC
  Administered 2013-11-17 – 2013-11-25 (×9): 100 mg via ORAL
  Filled 2013-11-17 (×10): qty 1

## 2013-11-17 MED ORDER — LORAZEPAM 1 MG PO TABS: 1.0000 mg | ORAL_TABLET | Freq: Three times a day (TID) | ORAL | Status: DC | PRN

## 2013-11-17 MED ORDER — ENOXAPARIN SODIUM 40 MG/0.4ML ~~LOC~~ SOLN: 40.0000 mg | SUBCUTANEOUS | Status: DC

## 2013-11-17 MED ORDER — LORAZEPAM 1 MG PO TABS
0.0000 mg | ORAL_TABLET | Freq: Four times a day (QID) | ORAL | Status: AC
  Administered 2013-11-17: 1 mg via ORAL
  Filled 2013-11-17: qty 2

## 2013-11-17 MED ORDER — HYDROMORPHONE HCL PF 1 MG/ML IJ SOLN
0.5000 mg | Freq: Once | INTRAMUSCULAR | Status: AC
  Administered 2013-11-17: 0.5 mg via INTRAVENOUS
  Filled 2013-11-17: qty 1

## 2013-11-17 MED ORDER — HYDROMORPHONE HCL PF 1 MG/ML IJ SOLN
1.0000 mg | Freq: Once | INTRAMUSCULAR | Status: AC
  Administered 2013-11-17: 1 mg via INTRAVENOUS
  Filled 2013-11-17: qty 1

## 2013-11-17 MED ORDER — ONDANSETRON HCL 4 MG/2ML IJ SOLN
4.0000 mg | Freq: Four times a day (QID) | INTRAMUSCULAR | Status: DC | PRN
  Administered 2013-11-25 – 2013-11-26 (×2): 4 mg via INTRAVENOUS
  Filled 2013-11-17 (×2): qty 2

## 2013-11-17 MED ORDER — FOLIC ACID 1 MG PO TABS
1.0000 mg | ORAL_TABLET | Freq: Every day | ORAL | Status: DC
  Administered 2013-11-17 – 2013-11-26 (×10): 1 mg via ORAL
  Filled 2013-11-17 (×10): qty 1

## 2013-11-17 MED ORDER — SODIUM CHLORIDE 0.9 % IV SOLN: INTRAVENOUS | Status: AC

## 2013-11-17 NOTE — ED Notes (Signed)
PT reports he drinks alcohol. Last consumed 1 beer yesterday and vomited.

## 2013-11-17 NOTE — Progress Notes (Signed)
Pt admitted to the unit. Pt is alert and oriented. Pt oriented to room, staff, and call bell. Bed in lowest position. Full assessment to Epic. Call bell with in reach. Told to call for assists. Will continue to monitor.  Gaston Dase E  

## 2013-11-17 NOTE — H&P (Signed)
[facility]                                                                                    Patient Demographics  [name], is a 43 y.o. male  CSN: [id number]  MRN: [id number]  DOB - Oct 14, 1970  Admit Date - 11/17/2013  Outpatient Primary MD for the patient is Angelica Chessman, MD   With History of -  Past Medical History  Diagnosis Date  . Hypertension   . Asthma   . Pancreatitis   . Cocaine abuse   . Depression   . H/O suicide attempt   . Heart murmur     "when he was little" (03/06/2013)  . Shortness of breath     "can happen at anytime" (03/06/2013)  . Anemia   . H/O hiatal hernia   . GERD (gastroesophageal reflux disease)   . Arthritis     "knees" (03/06/2013)  . Anxiety   . WPW (Wolff-Parkinson-White syndrome)     [name] 03/06/2013      Past Surgical History  Procedure Laterality Date  . Facial fracture surgery Left 1990's    "result of trauma" (03/06/2013)  . Eye surgery Left 1990's    "result of trauma" (03/06/2013)    in for   Chief Complaint  Patient presents with  . Abdominal Pain     HPI  [name]  is a 43 y.o. male, with history of chronic pancreatitis, substance abuse and depression with suicidal attempts in the past for which he was admitted to behavior health presenting today with 2 days history of left upper quadrant pain with nausea vomiting and diarrhea. No fever or chills. He thinks that it was food related and reports taking 1 beer yesterday . Patient denies headache dizziness or loss of consciousness. His last admission for chronic pancreatitis according to him was one and a half years ago.    Review of Systems    In addition to the HPI above, to No Fever-chills, No Headache, No changes with Vision or hearing, No problems swallowing food or Liquids, No Blood in stool or Urine, No dysuria, No new skin rashes or bruises, No new joints pains-aches,  No new weakness, tingling, numbness in any  extremity, No recent weight gain or loss, No polyuria, polydypsia or polyphagia, No significant Mental Stressors.  A full 10 point Review of Systems was done, except as stated above, all other Review of Systems were negative.   Social History History  Substance Use Topics  . Smoking status: Current Every Day Smoker -- 1.50 packs/day for 30 years    Types: Cigarettes  . Smokeless tobacco: Current User  . Alcohol Use: Yes     Comment: 12 pk beer daily 12oz cans liquor occasionally     Family History Family History  Problem Relation Age of Onset  . Hypertension Other   . Coronary artery disease Other      Prior to Admission medications   Medication Sig Start Date End Date Taking? Authorizing Provider  acetaminophen (TYLENOL) 325 MG tablet Take 650 mg by mouth 2 (two) times daily as needed (pain).   Yes Historical Provider, MD  albuterol (  PROVENTIL HFA;VENTOLIN HFA) 108 (90 BASE) MCG/ACT inhaler Inhale 1-2 puffs into the lungs every 6 (six) hours as needed for wheezing or shortness of breath. 11/08/13  Yes Encarnacion Slates, NP  lansoprazole (PREVACID) 15 MG capsule Take 1 capsule (15 mg total) by mouth 2 (two) times daily as needed (for reflux). 11/08/13  Yes Encarnacion Slates, NP  LORazepam (ATIVAN) 1 MG tablet Take 1 tablet (1 mg total) by mouth every 8 (eight) hours as needed for anxiety. 11/08/13  Yes Encarnacion Slates, NP  metoprolol (LOPRESSOR) 50 MG tablet Take 1 tablet (50 mg total) by mouth 2 (two) times daily. For high blood pressure 11/08/13  Yes Encarnacion Slates, NP  Multiple Vitamin (MULTIVITAMIN WITH MINERALS) TABS tablet Take 1 tablet by mouth daily. For low vitamin 11/08/13  Yes Encarnacion Slates, NP  nicotine (NICODERM CQ - DOSED IN MG/24 HOURS) 14 mg/24hr patch Place 1 patch (14 mg total) onto the skin daily. For nicotine dependence 11/08/13  Yes Encarnacion Slates, NP  oxyCODONE-acetaminophen (PERCOCET/ROXICET) 5-325 MG per tablet Take 2 tablets by mouth every 6 (six) hours as needed for moderate  pain or severe pain. 11/08/13  Yes Encarnacion Slates, NP  QUEtiapine (SEROQUEL) 100 MG tablet Take 1 tablet (100 mg total) by mouth at bedtime. For mood control 11/08/13  Yes Encarnacion Slates, NP  QUEtiapine (SEROQUEL) 25 MG tablet Take 25 mg by mouth 2 (two) times daily. Take 1 tablet (25 mg) every morning and 1 tablet at 3pm   Yes Historical Provider, MD  sertraline (ZOLOFT) 25 MG tablet Take 3 tablets (75 mg total) by mouth daily. For depression 11/09/13  Yes Encarnacion Slates, NP    Allergies  Allergen Reactions  . Trazodone And Nefazodone Other (See Comments)    Muscle spasms  . Shellfish-Derived Products Nausea And Vomiting    Physical Exam  Vitals  Blood pressure 165/98, pulse 125, temperature 99 F (37.2 C), temperature source Oral, resp. rate 18, height 5\' 9"  (1.753 m), weight 62.143 kg (137 lb), SpO2 99.00%.   1. General well-developed, well-nourished African American male in pain  2.  Not Suicidal or Homicidal, Awake Alert, Oriented X 3.  3. No F.N deficits, ALL C.Nerves Intact, Strength 5/5 all 4 extremities,   4. Ears and Eyes appear Normal, Conjunctivae clear, PERRLA. Moist Oral Mucosa.  5. Supple Neck, No JVD, No cervical lymphadenopathy appriciated, No Carotid Bruits.  6. Symmetrical Chest wall movement, Good air movement bilaterally, CTAB.  7. RRR, No Gallops, Rubs or Murmurs, No Parasternal Heave.  8. Positive Bowel Sounds, Abdomen Soft, generalized tenderness in the upper quadrants. No organomegaly appriciated,No rebound -guarding or rigidity.  9.  No Cyanosis, Normal Skin Turgor, No Skin Rash or Bruise.  10. Good muscle tone,  joints appear normal , no effusions, Normal ROM.  11. No Palpable Lymph Nodes in Neck or Axillae    Data Review  CBC  Recent Labs Lab 11/17/13 1450  WBC 9.0  HGB 13.9  HCT 41.6  PLT 260  MCV 95.2  MCH 31.8  MCHC 33.4  RDW 16.4*  LYMPHSABS 3.0  MONOABS 0.8  EOSABS 0.1  BASOSABS 0.0    ------------------------------------------------------------------------------------------------------------------  Chemistries   Recent Labs Lab 11/17/13 1450  NA 129*  K 4.1  CL 88*  CO2 17*  GLUCOSE 87  BUN 12  CREATININE 0.62  CALCIUM 9.4  AST 135*  ALT 92*  ALKPHOS 173*  BILITOT 1.4*   ------------------------------------------------------------------------------------------------------------------ estimated creatinine clearance  is 103.5 ml/min (by C-G formula based on Cr of 0.62). ------------------------------------------------------------------------------------------------------------------ No results found for this basename: TSH, T4TOTAL, FREET3, T3FREE, THYROIDAB,  in the last 72 hours   Coagulation profile No results found for this basename: INR, PROTIME,  in the last 168 hours ------------------------------------------------------------------------------------------------------------------- No results found for this basename: DDIMER,  in the last 72 hours -------------------------------------------------------------------------------------------------------------------  Cardiac Enzymes No results found for this basename: CK, CKMB, TROPONINI, MYOGLOBIN,  in the last 168 hours ------------------------------------------------------------------------------------------------------------------ No components found with this basename: POCBNP,    ---------------------------------------------------------------------------------------------------------------  Urinalysis    Component Value Date/Time   COLORURINE YELLOW 10/17/2013 0806   APPEARANCEUR CLEAR 10/17/2013 0806   LABSPEC 1.017 10/17/2013 0806   PHURINE 5.5 10/17/2013 0806   GLUCOSEU NEGATIVE 10/17/2013 0806   HGBUR NEGATIVE 10/17/2013 0806   HGBUR negative 04/30/2010 1020   BILIRUBINUR NEGATIVE 10/17/2013 0806   KETONESUR NEGATIVE 10/17/2013 0806   PROTEINUR NEGATIVE 10/17/2013 0806   UROBILINOGEN 0.2  10/17/2013 0806   NITRITE NEGATIVE 10/17/2013 0806   LEUKOCYTESUR NEGATIVE 10/17/2013 0806    ----------------------------------------------------------------------------------------------------------------   Imaging results:   US Abdomen Complete  10/19/2013   CLINICAL DATA:  Elevated LFTs and abdominal pain  EXAM: ULTRASOUND ABDOMEN COMPLETE  COMPARISON:  08/17/2013  FINDINGS: Gallbladder:  No gallstones or wall thickening visualized. No sonographic Murphy sign noted.  Common bile duct:  Diameter: 2.8 mm  Liver:  No focal lesion identified. Within normal limits in parenchymal echogenicity.  IVC:  No abnormality visualized.  Pancreas:  There is a hypoechoic nodule along the ventral surface of the pancreatic body measuring 1.2 x 0.6 x 1.0 cm.  Spleen:  Size and appearance within normal limits.  Right Kidney:  Length: 11.6 cm. Echogenicity within normal limits. No mass or hydronephrosis visualized.  Left Kidney:  Length: 11.4 cm. Echogenicity within normal limits. No mass or hydronephrosis visualized.  Abdominal aorta:  No aneurysm visualized.  Other findings:  None.  IMPRESSION: 1. No acute findings. 2. Indeterminate hypoechoic nodule along the ventral surface of the pancreatic body. Not seen on previous CT of the upper abdomen. Recommend nonemergent, followup imaging with pancreas protocol MRI or CT.   Electronically Signed   By: Kerby Moors M.D.   On: 10/19/2013 09:43   Dg Abd Acute W/chest  11/17/2013   CLINICAL DATA:  Abdominal pain. Left lower quadrant pain in mid sternal chest pain.  EXAM: ACUTE ABDOMEN SERIES (ABDOMEN 2 VIEW & CHEST 1 VIEW)  COMPARISON:  Chest radiograph 10/17/2013. Abdominal radiograph 10/14/2013.  FINDINGS: There is no evidence of dilated bowel loops or free intraperitoneal air. No radiopaque calculi or significant stool burden. No abdominal mass effect. There is a focal area of sclerosis and possible central lucency associated with the right tenth rib.  The heart.  mediastinal, and hilar contours are normal. The lungs are well expanded and clear. The trachea is midline. There is mild gaseous distention of the upper thoracic esophagus.  IMPRESSION: 1. No acute cardiopulmonary disease. Nonobstructive bowel gas pattern. 2. Focal sclerosis associated with the right tenth rib. Question if this could reflect a healing rib fracture.   Electronically Signed   By: Curlene Dolphin M.D.   On: 11/17/2013 17:42    My personal review of EKG: Sinus tach at 115 with PACs    Assessment & Plan   1. chronic pancreatitis with nausea vomiting and diarrhea    Clear liquid diet    IV fluids    Pain control    IV Protonix 2.  History of alcoholism    Ativan when necessary    Thiamine and folate    Watch for withdrawal 3. History of depression with suicide attempts in the past     2 admits to behavior health in the left one month 4. Tachycardia with history of WPW     Placement telemetry     When necessary Lopressor 5. Transaminitis     Follow liver function tests      Ultrasound of abdomen in a.m. 6. Hyponatremia     Probably alcohol related, although patient denies alcoholism lately     Normal saline   DVT Prophylaxis Lovenox  AM Labs Ordered, also please review Full Orders  Code Status full  Disposition Plan: Home  Time spent in minutes : 35 minutes  Condition GUARDED guarded   @SIGNATURE @

## 2013-11-17 NOTE — ED Notes (Signed)
PT reports he is having a pancreatitis attack and can't keep food down and has had diarrhea. Emesis x 3, diarrhea x3 over 24hrs.

## 2013-11-17 NOTE — ED Notes (Signed)
EMS stated, he has pancreatitis. He's having abdominal pain that started yesterday.

## 2013-11-17 NOTE — Progress Notes (Signed)
Report received from Mia, RN

## 2013-11-17 NOTE — ED Provider Notes (Signed)
CSN: [id number]     Arrival date & time 11/17/13  1437 History   First MD Initiated Contact with Patient 11/17/13 1625     Chief Complaint  Patient presents with  . Abdominal Pain     (Consider location/radiation/quality/duration/timing/severity/associated sxs/prior Treatment) Patient is a 43 y.o. male presenting with abdominal pain. The history is provided by the patient.  Abdominal Pain Pain location:  LUQ Associated symptoms: diarrhea, nausea and vomiting   Associated symptoms: no chest pain and no shortness of breath    patient upper abdominal pain nausea vomiting diarrhea. History pancreatitis. States this feels like his previous pancreatitis. Began around 2 days ago. States he has no appetite and cannot keep medicines or food down. No fevers. He states he did drink some alcohol 2 days ago, but states his after the pain started it was to help US abdomen. He was discharged for behavioral health 3-4 days ago.  Past Medical History  Diagnosis Date  . Hypertension   . Asthma   . Pancreatitis   . Cocaine abuse   . Depression   . H/O suicide attempt   . Heart murmur     "when he was little" (03/06/2013)  . Shortness of breath     "can happen at anytime" (03/06/2013)  . Anemia   . H/O hiatal hernia   . GERD (gastroesophageal reflux disease)   . Arthritis     "knees" (03/06/2013)  . Anxiety   . WPW (Wolff-Parkinson-White syndrome)     [name] 03/06/2013   Past Surgical History  Procedure Laterality Date  . Facial fracture surgery Left 1990's    "result of trauma" (03/06/2013)  . Eye surgery Left 1990's    "result of trauma" (03/06/2013)   Family History  Problem Relation Age of Onset  . Hypertension Other   . Coronary artery disease Other    History  Substance Use Topics  . Smoking status: Current Every Day Smoker -- 1.50 packs/day for 30 years    Types: Cigarettes  . Smokeless tobacco: Current User  . Alcohol Use: Yes     Comment: 12 pk beer daily 12oz cans liquor  occasionally    Review of Systems  Constitutional: Negative for activity change and appetite change.  Eyes: Negative for pain.  Respiratory: Negative for chest tightness and shortness of breath.   Cardiovascular: Negative for chest pain and leg swelling.  Gastrointestinal: Positive for nausea, vomiting, abdominal pain and diarrhea.  Genitourinary: Negative for flank pain.  Musculoskeletal: Negative for back pain and neck stiffness.  Skin: Negative for rash.  Neurological: Negative for weakness, numbness and headaches.  Psychiatric/Behavioral: Negative for behavioral problems.      Allergies  Trazodone and nefazodone and Shellfish-derived products  Home Medications   Prior to Admission medications   Medication Sig Start Date End Date Taking? Authorizing Provider  acetaminophen (TYLENOL) 325 MG tablet Take 650 mg by mouth 2 (two) times daily as needed (pain).   Yes Historical Provider, MD  albuterol (PROVENTIL HFA;VENTOLIN HFA) 108 (90 BASE) MCG/ACT inhaler Inhale 1-2 puffs into the lungs every 6 (six) hours as needed for wheezing or shortness of breath. 11/08/13  Yes Encarnacion Slates, NP  lansoprazole (PREVACID) 15 MG capsule Take 1 capsule (15 mg total) by mouth 2 (two) times daily as needed (for reflux). 11/08/13  Yes Encarnacion Slates, NP  LORazepam (ATIVAN) 1 MG tablet Take 1 tablet (1 mg total) by mouth every 8 (eight) hours as needed for anxiety. 11/08/13  Yes Herbert Pun  I [place]woko, NP  metoprolol (LOPRESSOR) 50 MG tablet Take 1 tablet (50 mg total) by mouth 2 (two) times daily. For high blood pressure 11/08/13  Yes Encarnacion Slates, NP  Multiple Vitamin (MULTIVITAMIN WITH MINERALS) TABS tablet Take 1 tablet by mouth daily. For low vitamin 11/08/13  Yes Encarnacion Slates, NP  nicotine (NICODERM CQ - DOSED IN MG/24 HOURS) 14 mg/24hr patch Place 1 patch (14 mg total) onto the skin daily. For nicotine dependence 11/08/13  Yes Encarnacion Slates, NP  oxyCODONE-acetaminophen (PERCOCET/ROXICET) 5-325 MG per tablet Take 2  tablets by mouth every 6 (six) hours as needed for moderate pain or severe pain. 11/08/13  Yes Encarnacion Slates, NP  QUEtiapine (SEROQUEL) 100 MG tablet Take 1 tablet (100 mg total) by mouth at bedtime. For mood control 11/08/13  Yes Encarnacion Slates, NP  QUEtiapine (SEROQUEL) 25 MG tablet Take 25 mg by mouth 2 (two) times daily. Take 1 tablet (25 mg) every morning and 1 tablet at 3pm   Yes Historical Provider, MD  sertraline (ZOLOFT) 25 MG tablet Take 3 tablets (75 mg total) by mouth daily. For depression 11/09/13  Yes Encarnacion Slates, NP   BP 160/93  Pulse 110  Temp(Src) 99 F (37.2 C) (Oral)  Resp 16  Ht 5\' 9"  (1.753 m)  Wt 137 lb (62.143 kg)  BMI 20.22 kg/m2  SpO2 99% Physical Exam  Nursing note and vitals reviewed. Constitutional: He is oriented to person, place, and time. He appears well-developed and well-nourished.  HENT:  Head: Normocephalic and atraumatic.  Eyes: EOM are normal. Pupils are equal, round, and reactive to light.  Neck: Normal range of motion. Neck supple.  Cardiovascular: Regular rhythm and normal heart sounds.   No murmur heard. Tachycardia   Pulmonary/Chest: Effort normal and breath sounds normal.  Abdominal: Soft. Bowel sounds are normal. He exhibits no distension and no mass. There is tenderness. There is no rebound and no guarding.  Left upper quadrant and upper abdominal tenderness. No guarding.  Musculoskeletal: Normal range of motion. He exhibits no edema.  Neurological: He is alert and oriented to person, place, and time. No cranial nerve deficit.  Skin: Skin is warm and dry.  Psychiatric: He has a normal mood and affect.    ED Course  Procedures (including critical care time) Labs Review Labs Reviewed  CBC WITH DIFFERENTIAL - Abnormal; Notable for the following:    RDW 16.4 (*)    All other components within normal limits  COMPREHENSIVE METABOLIC PANEL - Abnormal; Notable for the following:    Sodium 129 (*)    Chloride 88 (*)    CO2 17 (*)    Total  Protein 9.3 (*)    AST 135 (*)    ALT 92 (*)    Alkaline Phosphatase 173 (*)    Total Bilirubin 1.4 (*)    Anion gap 24 (*)    All other components within normal limits  LIPASE, BLOOD - Abnormal; Notable for the following:    Lipase 80 (*)    All other components within normal limits  ETHANOL - Abnormal; Notable for the following:    Alcohol, Ethyl (B) 111 (*)    All other components within normal limits  AMYLASE  URINE RAPID DRUG SCREEN (HOSP PERFORMED)    Imaging Review Dg Abd Acute W/chest  11/17/2013   CLINICAL DATA:  Abdominal pain. Left lower quadrant pain in mid sternal chest pain.  EXAM: ACUTE ABDOMEN SERIES (ABDOMEN 2 VIEW &  CHEST 1 VIEW)  COMPARISON:  Chest radiograph 10/17/2013. Abdominal radiograph 10/14/2013.  FINDINGS: There is no evidence of dilated bowel loops or free intraperitoneal air. No radiopaque calculi or significant stool burden. No abdominal mass effect. There is a focal area of sclerosis and possible central lucency associated with the right tenth rib.  The heart. mediastinal, and hilar contours are normal. The lungs are well expanded and clear. The trachea is midline. There is mild gaseous distention of the upper thoracic esophagus.  IMPRESSION: 1. No acute cardiopulmonary disease. Nonobstructive bowel gas pattern. 2. Focal sclerosis associated with the right tenth rib. Question if this could reflect a healing rib fracture.   Electronically Signed   By: Curlene Dolphin M.D.   On: 11/17/2013 17:42     EKG Interpretation None      MDM   Final diagnoses:  Abdominal pain, unspecified site  Alcohol abuse, unspecified    She was abdominal pain nausea vomiting diarrhea. History same. His history of pancreatitis, however lipase is low since then in the last month. Still could be early. He does have a bicarbonate of 17. He has had IV fluids and remains tachycardic. Will admit to internal medicine.    Jasper Riling. Alvino Chapel, MD 11/17/13 2024

## 2013-11-17 NOTE — ED Notes (Signed)
Provider OK'd ice chips for PT; gave ice chips

## 2013-11-18 ENCOUNTER — Inpatient Hospital Stay (HOSPITAL_COMMUNITY): Payer: Self-pay

## 2013-11-18 DIAGNOSIS — F17200 Nicotine dependence, unspecified, uncomplicated: Secondary | ICD-10-CM

## 2013-11-18 DIAGNOSIS — I10 Essential (primary) hypertension: Secondary | ICD-10-CM

## 2013-11-18 LAB — COMPREHENSIVE METABOLIC PANEL
ALT: 63 U/L — AB (ref 0–53)
AST: 78 U/L — AB (ref 0–37)
Albumin: 3.4 g/dL — ABNORMAL LOW (ref 3.5–5.2)
Alkaline Phosphatase: 134 U/L — ABNORMAL HIGH (ref 39–117)
Anion gap: 14 (ref 5–15)
BUN: 6 mg/dL (ref 6–23)
CALCIUM: 8.6 mg/dL (ref 8.4–10.5)
CO2: 19 mEq/L (ref 19–32)
Chloride: 102 mEq/L (ref 96–112)
Creatinine, Ser: 0.6 mg/dL (ref 0.50–1.35)
GFR calc non Af Amer: >90 mL/min (ref >90)
Glucose, Bld: 77 mg/dL (ref 70–99)
Potassium: 3.9 mEq/L (ref 3.7–5.3)
SODIUM: 135 meq/L — AB (ref 137–147)
Total Bilirubin: 1.4 mg/dL — ABNORMAL HIGH (ref 0.3–1.2)
Total Protein: 7.2 g/dL (ref 6.0–8.3)

## 2013-11-18 LAB — CBC
HCT: 33.7 % — ABNORMAL LOW (ref 39.0–52.0)
Hemoglobin: 10.9 g/dL — ABNORMAL LOW (ref 13.0–17.0)
MCH: 31 pg (ref 26.0–34.0)
MCHC: 32.3 g/dL (ref 30.0–36.0)
MCV: 95.7 fL (ref 78.0–100.0)
PLATELETS: 223 10*3/uL (ref 150–400)
RBC: 3.52 MIL/uL — AB (ref 4.22–5.81)
RDW: 16.2 % — ABNORMAL HIGH (ref 11.5–15.5)
WBC: 7.9 10*3/uL (ref 4.0–10.5)

## 2013-11-18 MED ORDER — KETOROLAC TROMETHAMINE 30 MG/ML IJ SOLN
30.0000 mg | Freq: Once | INTRAMUSCULAR | Status: AC
  Administered 2013-11-18: 30 mg via INTRAVENOUS
  Filled 2013-11-18: qty 1

## 2013-11-18 MED ORDER — GI COCKTAIL ~~LOC~~
30.0000 mL | Freq: Three times a day (TID) | ORAL | Status: DC | PRN
  Administered 2013-11-18 – 2013-11-26 (×11): 30 mL via ORAL
  Filled 2013-11-18 (×11): qty 30

## 2013-11-18 NOTE — Progress Notes (Signed)
INITIAL NUTRITION ASSESSMENT  DOCUMENTATION CODES Per approved criteria  -Severe malnutrition in the context of chronic illness   INTERVENTION: Recommend Ensure Complete po BID, each supplement provides 350 kcal and 13 grams of protein, once diet order permits. RD to continue to follow nutrition care plan.  NUTRITION DIAGNOSIS: Inadequate oral intake related to altered GI function as evidenced by need for slow diet advancement.   Goal: Intake to meet >90% of estimated nutrition needs. Further diet advancement.  Monitor:  weight trends, lab trends, I/O's, PO intake, supplement tolerance, further diet advancement  Reason for Assessment: Malnutrition Screening Tools  43 y.o. male  Admitting Dx: Abdominal pain, unspecified site  ASSESSMENT: PMHx significant for chronic pancreatitis, substance abuse, and hx of suicide attempts. Admitted with n/v/d and abdominal pain x 2 days. Work-up reveals chronic pancreatitis.  Patient reports that he has not been able to eat well for several months. Has hx of "stomach issues" and tries to eat a low-fat diet. Pt providing limited information at this time. Recent 2 admissions to [facility]. Agreeable to Ensure once diet order permits.  Nutrition Focused Physical Exam:   Subcutaneous Fat:  Orbital Region: WNL  Upper Arm Region: moderate depletion Thoracic and Lumbar Region: severe depletion  Muscle:  Temple Region: WNL  Clavicle Bone Region: mild depletion  Clavicle and Acromion Bone Region: mild depletion  Scapular Bone Region: NA  Dorsal Hand: NA  Patellar Region: severe depletion  Anterior Thigh Region: severe depletion  Posterior Calf Region: severe depletion   Edema: none  Pt meets criteria for severe MALNUTRITION in the context of chronic illness as evidenced by severe depletion of muscle mass and fat mass, and 7% weight loss x 2 months and intake of <75% x at least 1 month.  Sodium low at 135 Potassium WNL  Height: Ht Readings from  Last 1 Encounters:  11/17/13 5\' 9"  (1.753 m)    Weight: Wt Readings from Last 1 Encounters:  11/17/13 137 lb (62.143 kg)    Ideal Body Weight: 160 lb  % Ideal Body Weight: 86%  Wt Readings from Last 10 Encounters:  11/17/13 137 lb (62.143 kg)  10/22/13 126 lb (57.153 kg)  10/06/13 130 lb 8.2 oz (59.2 kg)  09/13/13 147 lb (66.679 kg)  08/16/13 147 lb (66.679 kg)  08/08/13 147 lb (66.679 kg)  04/22/13 145 lb (65.772 kg)  03/20/13 151 lb 3.2 oz (68.584 kg)  03/16/13 143 lb (64.864 kg)  03/07/13 142 lb 3.2 oz (64.5 kg)    Usual Body Weight: 147 lb  % Usual Body Weight: 93%  BMI:  Body mass index is 20.22 kg/(m^2). WNL  Estimated Nutritional Needs: Kcal: 1800 - 2000 Protein: at least 93 g daily Fluid: 1.8 - 2 liters  Skin: intact  Diet Order: Clear Liquid  EDUCATION NEEDS: -Education not appropriate at this time  No intake or output data in the 24 hours ending 11/18/13 1037  Last BM: 7/12  Labs:   Recent Labs Lab 11/17/13 1450 11/18/13 0740  NA 129* 135*  K 4.1 3.9  CL 88* 102  CO2 17* 19  BUN 12 6  CREATININE 0.62 0.60  CALCIUM 9.4 8.6  GLUCOSE 87 77    CBG (last 3)  No results found for this basename: GLUCAP,  in the last 72 hours  Scheduled Meds: . sodium chloride   Intravenous STAT  . enoxaparin (LOVENOX) injection  40 mg Subcutaneous Q24H  . folic acid  1 mg Oral Daily  . LORazepam  0-4 mg Oral Q6H   Followed by  . [START ON 11/19/2013] LORazepam  0-4 mg Oral Q12H  . metoprolol  50 mg Oral BID  . multivitamin with minerals  1 tablet Oral Daily  . nicotine  14 mg Transdermal Daily  . pantoprazole (PROTONIX) IV  40 mg Intravenous Q12H  . pneumococcal 23 valent vaccine  0.5 mL Intramuscular Tomorrow-1000  . QUEtiapine  100 mg Oral QHS  . QUEtiapine  25 mg Oral 2 times per day  . sertraline  75 mg Oral Daily  . thiamine  100 mg Oral Daily    Continuous Infusions: . sodium chloride 75 mL/hr at 11/18/13 3729    Past Medical History   Diagnosis Date  . Hypertension   . Asthma   . Pancreatitis   . Cocaine abuse   . Depression   . H/O suicide attempt   . Heart murmur     "when he was little" (03/06/2013)  . Shortness of breath     "can happen at anytime" (03/06/2013)  . Anemia   . H/O hiatal hernia   . GERD (gastroesophageal reflux disease)   . Arthritis     "knees" (03/06/2013)  . Anxiety   . WPW (Wolff-Parkinson-White syndrome)     [name] 03/06/2013    Past Surgical History  Procedure Laterality Date  . Facial fracture surgery Left 1990's    "result of trauma" (03/06/2013)  . Eye surgery Left 1990's    "result of trauma" (03/06/2013)    Inda Coke MS, RD, LDN Inpatient Registered Dietitian Pager: [phone] After-hours pager: [phone]

## 2013-11-18 NOTE — Care Management Note (Addendum)
    Page 1 of 1   11/25/2013     3:45:43 PM CARE MANAGEMENT NOTE 11/25/2013  Patient:  [name], [name]   Account Number:  [id number]  Date Initiated:  11/18/2013  Documentation initiated by:  Tomi Bamberger  Subjective/Objective Assessment:   dx chronic pancreatitis  admit- lives with mom     Action/Plan:   Anticipated DC Date:  11/26/2013   Anticipated DC Plan:  [place]  CM consult  Follow-up appt scheduled  MATCH Program      Choice offered to / List presented to:             Status of service:  Completed, signed off Medicare Important Message given?   (If response is "NO", the following Medicare IM given date fields will be blank) Date Medicare IM given:   Medicare IM given by:   Date Additional Medicare IM given:   Additional Medicare IM given by:    Discharge Disposition:  HOME/SELF CARE  Per UR Regulation:  Reviewed for med. necessity/level of care/duration of stay  If discussed at [place] of Stay Meetings, dates discussed:    Comments:  11/25/13 [place], BSN [phone] patient c/o  worsening pain, having emesis, downgraded diet to full liquids, will need to see if patient is able to tolerate diet before dc.  11/22/13 [place], BSN [phone] patient states will need ast with medications , NCM asisted patient with Match letter, informed patient if he does not go home over weekend , we will need to change the date on his Match letter.  Patient has f/u apt scheduled with [facility] as well.

## 2013-11-18 NOTE — Progress Notes (Signed)
Pt c/o severe pain abdomen. Pt is notified that none of his pain meds are due at this time. Per pt request paged to Lamar Blinks, NP and received the order for x1 IV Ketorolac. As per order it is okay to give IV Ketorolac and po oxycodone at the same time. Will monitor.

## 2013-11-18 NOTE — Progress Notes (Signed)
[facility] PROGRESS NOTE  [name] YOV:[id number] DOB: [birth year] DOA: 11/17/2013 PCP: Angelica Chessman, MD  Assessment/Plan: 1. chronic pancreatitis with nausea vomiting and diarrhea  Clear liquid diet  IV fluids  Pain control  IV Protonix  2. History of alcoholism  Ativan when necessary  Thiamine and folate  Watch for withdrawal  3. History of depression with suicide attempts in the past 2 admits to behavior health in the left one month  4. Tachycardia with history of WPW  Placement telemetry  When necessary Lopressor  5. Transaminitis  Follow liver function tests  Ultrasound of abdomen in a.m.  6. Hyponatremia  Probably alcohol related, although patient denies alcoholism lately  Normal saline  Code Status: Full Family Communication: Pt in room Disposition Plan: Pending  Consultants:    Procedures:    Antibiotics:   (indicate start date, and stop date if known)  HPI/Subjective: Complains of cont abd pain. No acute events noted overnight  Objective: Filed Vitals:   11/17/13 2226 11/18/13 0051 11/18/13 0313 11/18/13 1452  BP: 152/100 123/81 129/80 145/89  Pulse:  91 93 97  Temp:   97.9 F (36.6 C) 97.6 F (36.4 C)  TempSrc:   Oral Oral  Resp:   16 18  Height:      Weight:      SpO2:   96% 98%   No intake or output data in the 24 hours ending 11/18/13 1511 Filed Weights   11/17/13 1443  Weight: 137 lb (62.143 kg)    Exam:   General:  Awake, in nad  Cardiovascular: regular, s1, s2  Respiratory: normal resp effort, no wheezing  Abdomen: soft,nondistended  Musculoskeletal: perfused, no clubbing   Data Reviewed: Basic Metabolic Panel:  Recent Labs Lab 11/17/13 1450 11/18/13 0740  NA 129* 135*  K 4.1 3.9  CL 88* 102  CO2 17* 19  GLUCOSE 87 77  BUN 12 6  CREATININE 0.62 0.60  CALCIUM 9.4 8.6   Liver Function Tests:  Recent Labs Lab 11/17/13 1450 11/18/13 0740  AST 135* 78*  ALT 92* 63*  ALKPHOS 173* 134*   BILITOT 1.4* 1.4*  PROT 9.3* 7.2  ALBUMIN 4.2 3.4*    Recent Labs Lab 11/17/13 1450 11/17/13 1652  LIPASE  --  80*  AMYLASE 94  --    No results found for this basename: AMMONIA,  in the last 168 hours CBC:  Recent Labs Lab 11/17/13 1450 11/18/13 0740  WBC 9.0 7.9  NEUTROABS 5.1  --   HGB 13.9 10.9*  HCT 41.6 33.7*  MCV 95.2 95.7  PLT 260 223   Cardiac Enzymes: No results found for this basename: CKTOTAL, CKMB, CKMBINDEX, TROPONINI,  in the last 168 hours BNP (last 3 results) No results found for this basename: PROBNP,  in the last 8760 hours CBG: No results found for this basename: GLUCAP,  in the last 168 hours  No results found for this or any previous visit (from the past 240 hour(s)).   Studies: US Abdomen Complete  11/18/2013   CLINICAL DATA:  Abdominal pain.  Pancreatitis.  EXAM: ULTRASOUND ABDOMEN COMPLETE  COMPARISON:  Ultrasound 10/19/2013 and CT on 08/17/2012  FINDINGS: Gallbladder:  No gallstones or wall thickening visualized. No sonographic Murphy sign noted.  Common bile duct:  Diameter: 8 mm, which is mildly dilated  Liver:  No focal lesion identified. Within normal limits in parenchymal echogenicity.  IVC:  No abnormality visualized.  Pancreas:  Enlarged with heterogeneous echotexture, suspicious for acute  pancreatitis. 8 mm hypoechoic nodule along the ventral surface of the pancreatic body, which could represent a complex cyst or solid mass.  Spleen:  Size and appearance within normal limits.  Right Kidney:  Length: 11.7 cm. Echogenicity within normal limits. No mass or hydronephrosis visualized.  Left Kidney:  Length: 11.0 cm. Echogenicity within normal limits. No mass or hydronephrosis visualized.  Abdominal aorta:  No aneurysm visualized.  Other findings:  None.  IMPRESSION: Enlarged and heterogeneous pancreas, suspicious for acute pancreatitis. 8 mm complex cyst versus solid mass along the ventral surface of pancreatic body. Recommend abdomen MRI without  and with contrast for further characterization.  Mild biliary ductal dilatation with common bile duct measuring 8 mm. This could also be further evaluated by MRCP.   Electronically Signed   By: Earle Gell M.D.   On: 11/18/2013 08:28   Dg Abd Acute W/chest  11/17/2013   CLINICAL DATA:  Abdominal pain. Left lower quadrant pain in mid sternal chest pain.  EXAM: ACUTE ABDOMEN SERIES (ABDOMEN 2 VIEW & CHEST 1 VIEW)  COMPARISON:  Chest radiograph 10/17/2013. Abdominal radiograph 10/14/2013.  FINDINGS: There is no evidence of dilated bowel loops or free intraperitoneal air. No radiopaque calculi or significant stool burden. No abdominal mass effect. There is a focal area of sclerosis and possible central lucency associated with the right tenth rib.  The heart. mediastinal, and hilar contours are normal. The lungs are well expanded and clear. The trachea is midline. There is mild gaseous distention of the upper thoracic esophagus.  IMPRESSION: 1. No acute cardiopulmonary disease. Nonobstructive bowel gas pattern. 2. Focal sclerosis associated with the right tenth rib. Question if this could reflect a healing rib fracture.   Electronically Signed   By: Curlene Dolphin M.D.   On: 11/17/2013 17:42    Scheduled Meds: . sodium chloride   Intravenous STAT  . enoxaparin (LOVENOX) injection  40 mg Subcutaneous Q24H  . folic acid  1 mg Oral Daily  . LORazepam  0-4 mg Oral Q6H   Followed by  . [START ON 11/19/2013] LORazepam  0-4 mg Oral Q12H  . metoprolol  50 mg Oral BID  . multivitamin with minerals  1 tablet Oral Daily  . nicotine  14 mg Transdermal Daily  . pantoprazole (PROTONIX) IV  40 mg Intravenous Q12H  . pneumococcal 23 valent vaccine  0.5 mL Intramuscular Tomorrow-1000  . QUEtiapine  100 mg Oral QHS  . QUEtiapine  25 mg Oral 2 times per day  . sertraline  75 mg Oral Daily  . thiamine  100 mg Oral Daily   Continuous Infusions: . sodium chloride 75 mL/hr at 11/18/13 0854    Principal Problem:    ABDOMINAL PAIN Active Problems:   Abdominal pain   Chronic pancreatitis  Time spent: 75min  [facility], [place] [facility] Pager [phone]. If 7PM-7AM, please contact night-coverage at [email or web address], password [facility] 11/18/2013, 3:11 PM  LOS: 1 day

## 2013-11-19 DIAGNOSIS — K859 Acute pancreatitis without necrosis or infection, unspecified: Principal | ICD-10-CM

## 2013-11-19 LAB — CBC
HEMATOCRIT: 31.5 % — AB (ref 39.0–52.0)
Hemoglobin: 10.1 g/dL — ABNORMAL LOW (ref 13.0–17.0)
MCH: 31 pg (ref 26.0–34.0)
MCHC: 32.1 g/dL (ref 30.0–36.0)
MCV: 96.6 fL (ref 78.0–100.0)
Platelets: 176 10*3/uL (ref 150–400)
RBC: 3.26 MIL/uL — ABNORMAL LOW (ref 4.22–5.81)
RDW: 16 % — ABNORMAL HIGH (ref 11.5–15.5)
WBC: 10 10*3/uL (ref 4.0–10.5)

## 2013-11-19 LAB — LIPASE, BLOOD: Lipase: 712 U/L — ABNORMAL HIGH (ref 11–59)

## 2013-11-19 MED ORDER — PANTOPRAZOLE SODIUM 40 MG PO TBEC
40.0000 mg | DELAYED_RELEASE_TABLET | Freq: Two times a day (BID) | ORAL | Status: DC
  Administered 2013-11-19 – 2013-11-26 (×15): 40 mg via ORAL
  Filled 2013-11-19 (×17): qty 1

## 2013-11-19 MED ORDER — ONDANSETRON HCL 4 MG/2ML IJ SOLN: 4.0000 mg | Freq: Four times a day (QID) | INTRAMUSCULAR | Status: DC | PRN

## 2013-11-19 MED ORDER — MORPHINE SULFATE (PF) 1 MG/ML IV SOLN
INTRAVENOUS | Status: DC
  Administered 2013-11-19: 1.5 mg via INTRAVENOUS
  Administered 2013-11-19: 20:00:00 via INTRAVENOUS
  Administered 2013-11-19: 3 mg via INTRAVENOUS
  Administered 2013-11-19: 21 mg via INTRAVENOUS
  Administered 2013-11-20 (×2): via INTRAVENOUS
  Administered 2013-11-20: 12 mg via INTRAVENOUS
  Administered 2013-11-20: 19:00:00 via INTRAVENOUS
  Administered 2013-11-20: 9.64 mg via INTRAVENOUS
  Administered 2013-11-21: 5.75 mg via INTRAVENOUS
  Administered 2013-11-21: 8 mg via INTRAVENOUS
  Administered 2013-11-21: 14 mg via INTRAVENOUS
  Administered 2013-11-21: 20:00:00 via INTRAVENOUS
  Administered 2013-11-21 (×2): 6 mg via INTRAVENOUS
  Administered 2013-11-21: 7 mg via INTRAVENOUS
  Administered 2013-11-22: 9 mg via INTRAVENOUS
  Filled 2013-11-19 (×9): qty 25

## 2013-11-19 MED ORDER — SODIUM CHLORIDE 0.9 % IJ SOLN: 9.0000 mL | INTRAMUSCULAR | Status: DC | PRN

## 2013-11-19 MED ORDER — NALOXONE HCL 0.4 MG/ML IJ SOLN: 0.4000 mg | INTRAMUSCULAR | Status: DC | PRN

## 2013-11-19 MED ORDER — DIPHENHYDRAMINE HCL 12.5 MG/5ML PO ELIX
12.5000 mg | ORAL_SOLUTION | Freq: Four times a day (QID) | ORAL | Status: DC | PRN
  Filled 2013-11-19: qty 5

## 2013-11-19 MED ORDER — DIPHENHYDRAMINE HCL 50 MG/ML IJ SOLN: 12.5000 mg | Freq: Four times a day (QID) | INTRAMUSCULAR | Status: DC | PRN

## 2013-11-19 NOTE — Progress Notes (Signed)
Pt c/o severe pain  and stated his pain is getting worse than before. Pt  Was medicated with IV Dilaudid @0630 , Paged to Lamar Blinks, NP. Awaiting for order.Will cont to monitor.

## 2013-11-19 NOTE — Progress Notes (Signed)
[facility] PROGRESS NOTE  [name] JME:[id number] DOB: 09/20/1970 DOA: 11/17/2013 PCP: Angelica Chessman, MD  Assessment/Plan: 1. Acute on chronic pancreatitis with nausea vomiting and diarrhea  Failed clear liquid diet - now NPO Repeat lipase from 80 -> 700's IV fluids  Transition pain meds to PCA IV Protonix  2. History of alcoholism  Ativan PRN Thiamine and folate  On CIWA 3. History of depression with suicide attempts in the past 2 admits to behavior health in the left one month  4. Tachycardia with history of WPW  Cont telemetry  PRN Lopressor  5. Transaminitis  Follow liver function tests  6. Hyponatremia  Likely secondary to alcohol 7. Liver lesion New mass vs cyst on abd [place] MRI of abd pending  Code Status: Full Family Communication: Pt in room Disposition Plan: D/c when pancreatitis resolves and pt tolerates PO  Consultants:    Procedures:    Antibiotics:   (indicate start date, and stop date if known)  HPI/Subjective: Complains of worse abd pain with trials of clears  Objective: Filed Vitals:   11/18/13 2317 11/19/13 0314 11/19/13 1037 11/19/13 1202  BP: 110/71 112/70 159/98   Pulse: 93 98 113   Temp: 98.6 F (37 C) 98.8 F (37.1 C)    TempSrc: Axillary Axillary    Resp: 16 18  18   Height:      Weight:      SpO2: 95% 97%  100%    Intake/Output Summary (Last 24 hours) at 11/19/13 1340 Last data filed at 11/19/13 0543  Gross per 24 hour  Intake    910 ml  Output      0 ml  Net    910 ml   Filed Weights   11/17/13 1443  Weight: 137 lb (62.143 kg)    Exam:   General:  Awake, in nad  Cardiovascular: regular, s1, s2  Respiratory: normal resp effort, no wheezing  Abdomen: soft,nondistended  Musculoskeletal: perfused, no clubbing   Data Reviewed: Basic Metabolic Panel:  Recent Labs Lab 11/17/13 1450 11/18/13 0740  NA 129* 135*  K 4.1 3.9  CL 88* 102  CO2 17* 19  GLUCOSE 87 77  BUN 12 6  CREATININE 0.62  0.60  CALCIUM 9.4 8.6   Liver Function Tests:  Recent Labs Lab 11/17/13 1450 11/18/13 0740  AST 135* 78*  ALT 92* 63*  ALKPHOS 173* 134*  BILITOT 1.4* 1.4*  PROT 9.3* 7.2  ALBUMIN 4.2 3.4*    Recent Labs Lab 11/17/13 1450 11/17/13 1652 11/19/13 0915  LIPASE  --  80* 712*  AMYLASE 94  --   --    No results found for this basename: AMMONIA,  in the last 168 hours CBC:  Recent Labs Lab 11/17/13 1450 11/18/13 0740 11/19/13 0625  WBC 9.0 7.9 10.0  NEUTROABS 5.1  --   --   HGB 13.9 10.9* 10.1*  HCT 41.6 33.7* 31.5*  MCV 95.2 95.7 96.6  PLT 260 223 176   Cardiac Enzymes: No results found for this basename: CKTOTAL, CKMB, CKMBINDEX, TROPONINI,  in the last 168 hours BNP (last 3 results) No results found for this basename: PROBNP,  in the last 8760 hours CBG: No results found for this basename: GLUCAP,  in the last 168 hours  No results found for this or any previous visit (from the past 240 hour(s)).   Studies: US Abdomen Complete  11/18/2013   CLINICAL DATA:  Abdominal pain.  Pancreatitis.  EXAM: ULTRASOUND ABDOMEN COMPLETE  COMPARISON:  Ultrasound 10/19/2013 and CT on 08/17/2012  FINDINGS: Gallbladder:  No gallstones or wall thickening visualized. No sonographic Murphy sign noted.  Common bile duct:  Diameter: 8 mm, which is mildly dilated  Liver:  No focal lesion identified. Within normal limits in parenchymal echogenicity.  IVC:  No abnormality visualized.  Pancreas:  Enlarged with heterogeneous echotexture, suspicious for acute pancreatitis. 8 mm hypoechoic nodule along the ventral surface of the pancreatic body, which could represent a complex cyst or solid mass.  Spleen:  Size and appearance within normal limits.  Right Kidney:  Length: 11.7 cm. Echogenicity within normal limits. No mass or hydronephrosis visualized.  Left Kidney:  Length: 11.0 cm. Echogenicity within normal limits. No mass or hydronephrosis visualized.  Abdominal aorta:  No aneurysm visualized.   Other findings:  None.  IMPRESSION: Enlarged and heterogeneous pancreas, suspicious for acute pancreatitis. 8 mm complex cyst versus solid mass along the ventral surface of pancreatic body. Recommend abdomen MRI without and with contrast for further characterization.  Mild biliary ductal dilatation with common bile duct measuring 8 mm. This could also be further evaluated by MRCP.   Electronically Signed   By: Earle Gell M.D.   On: 11/18/2013 08:28   Dg Abd Acute W/chest  11/17/2013   CLINICAL DATA:  Abdominal pain. Left lower quadrant pain in mid sternal chest pain.  EXAM: ACUTE ABDOMEN SERIES (ABDOMEN 2 VIEW & CHEST 1 VIEW)  COMPARISON:  Chest radiograph 10/17/2013. Abdominal radiograph 10/14/2013.  FINDINGS: There is no evidence of dilated bowel loops or free intraperitoneal air. No radiopaque calculi or significant stool burden. No abdominal mass effect. There is a focal area of sclerosis and possible central lucency associated with the right tenth rib.  The heart. mediastinal, and hilar contours are normal. The lungs are well expanded and clear. The trachea is midline. There is mild gaseous distention of the upper thoracic esophagus.  IMPRESSION: 1. No acute cardiopulmonary disease. Nonobstructive bowel gas pattern. 2. Focal sclerosis associated with the right tenth rib. Question if this could reflect a healing rib fracture.   Electronically Signed   By: Curlene Dolphin M.D.   On: 11/17/2013 17:42    Scheduled Meds: . enoxaparin (LOVENOX) injection  40 mg Subcutaneous Q24H  . folic acid  1 mg Oral Daily  . LORazepam  0-4 mg Oral Q6H   Followed by  . LORazepam  0-4 mg Oral Q12H  . metoprolol  50 mg Oral BID  . morphine   Intravenous 6 times per day  . multivitamin with minerals  1 tablet Oral Daily  . nicotine  14 mg Transdermal Daily  . pantoprazole  40 mg Oral BID  . pneumococcal 23 valent vaccine  0.5 mL Intramuscular Tomorrow-1000  . QUEtiapine  100 mg Oral QHS  . QUEtiapine  25 mg Oral 2  times per day  . sertraline  75 mg Oral Daily  . thiamine  100 mg Oral Daily   Continuous Infusions: . sodium chloride 75 mL/hr at 11/19/13 0543    Principal Problem:   ABDOMINAL PAIN Active Problems:   Abdominal pain   Chronic pancreatitis  Time spent: 42min  Lamarion Mcevers, [place] [facility] Pager [phone]. If 7PM-7AM, please contact night-coverage at [email or web address], password [facility] 11/19/2013, 1:40 PM  LOS: 2 days

## 2013-11-20 ENCOUNTER — Inpatient Hospital Stay (HOSPITAL_COMMUNITY): Payer: Self-pay

## 2013-11-20 DIAGNOSIS — F329 Major depressive disorder, single episode, unspecified: Secondary | ICD-10-CM

## 2013-11-20 DIAGNOSIS — F3289 Other specified depressive episodes: Secondary | ICD-10-CM

## 2013-11-20 LAB — COMPREHENSIVE METABOLIC PANEL
ALK PHOS: 115 U/L (ref 39–117)
ALT: 35 U/L (ref 0–53)
AST: 35 U/L (ref 0–37)
Albumin: 2.8 g/dL — ABNORMAL LOW (ref 3.5–5.2)
Anion gap: 12 (ref 5–15)
BILIRUBIN TOTAL: 0.4 mg/dL (ref 0.3–1.2)
CHLORIDE: 107 meq/L (ref 96–112)
CO2: 24 meq/L (ref 19–32)
Calcium: 8.3 mg/dL — ABNORMAL LOW (ref 8.4–10.5)
Creatinine, Ser: 0.54 mg/dL (ref 0.50–1.35)
GLUCOSE: 78 mg/dL (ref 70–99)
POTASSIUM: 3.8 meq/L (ref 3.7–5.3)
SODIUM: 143 meq/L (ref 137–147)
TOTAL PROTEIN: 6.4 g/dL (ref 6.0–8.3)

## 2013-11-20 LAB — LIPASE, BLOOD: Lipase: 391 U/L — ABNORMAL HIGH (ref 11–59)

## 2013-11-20 MED ORDER — GADOBENATE DIMEGLUMINE 529 MG/ML IV SOLN
15.0000 mL | Freq: Once | INTRAVENOUS | Status: AC
  Administered 2013-11-20: 12 mL via INTRAVENOUS

## 2013-11-20 MED ORDER — DEXTROSE-NACL 5-0.9 % IV SOLN
INTRAVENOUS | Status: DC
  Administered 2013-11-20: 17:00:00 via INTRAVENOUS
  Administered 2013-11-20 – 2013-11-21 (×2): 150 mL/h via INTRAVENOUS
  Administered 2013-11-21 – 2013-11-22 (×4): via INTRAVENOUS
  Administered 2013-11-22: 150 mL/h via INTRAVENOUS
  Administered 2013-11-23 (×3): via INTRAVENOUS
  Administered 2013-11-24: 1000 mL via INTRAVENOUS
  Administered 2013-11-24 – 2013-11-25 (×2): via INTRAVENOUS

## 2013-11-20 NOTE — Progress Notes (Signed)
Chaplain Note: Patient was sitting up in bed, upon entering the room. His mother was also present, at bedside. Mother stepped out of the room at patient's request. Patient expressed his concerns and fears around his recent diagnosis. He said that with this news, he would have to make a lot of changes with his consumption. Patient states that he is ready to make these changes because he doesn't want to and can no longer handle living so carefree. Patient seemed receptive to the visit and thanked Chaplain for listening.  Sheryn Bison  11/20/13 1600  Clinical Encounter Type  Visited With Patient;Family;Health care provider  Visit Type Follow-up;Spiritual support;Social support  Referral From Patient;Chaplain;Nurse  Spiritual Encounters  Spiritual Needs Emotional  Stress Factors  Patient Stress Factors Family relationships;Health changes

## 2013-11-20 NOTE — Progress Notes (Signed)
PATIENT DETAILS Name: [name] Age: 43 y.o. Sex: male Date of Birth: 04-12-1971 Admit Date: 11/17/2013 Admitting Physician Merton Border, MD [id number], Gabrielle Dare, MD  Subjective: Still complains of abdominal pain  Assessment/Plan: Principal Problem: Acute on Chronic Pancreatitis -likely alcoholic. Claims that after he recently got discharged from [facility] had a "few"drinks (Alcohol level on 7/12-111!!) -initially given clear liquids-but kept NPO from 7/14 given worsening pain. Continue with NPO status, IVF, supportive care, Morphine PCA -Abd Ultrasound on 7/13 showed changes consistent with pancreatitis, however also showed a cystic lesion, MRI Abd on 7/15 showed moderate acute pancreatitis and small pancreatic pseudocysts measuring up to 1 cm. -follow closely  Active Problems: Etoh Abuse -awake, alert, very minimally tremulous -on Ativan per CIWA protocol. Continue with Thiamine and folate  -counseled regarding importance of quitting  Suspected Alcoholic Hepatitis -AST almost 2 times ALT-given hx of ETOH intake-high suspicion for ETOH hepatitis -follow LFT's  Hyponatremia -resolved -suspected beer potomania  History of depression with suicide attempts in the past 2 admits to behavior health in the left one month  -c/w Seroquel,Zoloft -denies any suicidal or homicidal ideation at present  Tachycardia with history of WPW  -c/w Metoprolol-stable  Tobacco Abuse -c/w transdermal Nicotine -counseled extensively  Disposition: Remain inpatient  DVT Prophylaxis: Prophylactic Lovenox   Code Status: Full code   Family Communication None at bedside  Procedures:  None  CONSULTS:  None  Time spent 40 minutes-which includes 50% of the time with face-to-face with patient/ family and coordinating care related to the above assessment and plan.    MEDICATIONS: Scheduled Meds: . enoxaparin (LOVENOX) injection  40 mg Subcutaneous Q24H  . folic acid  1  mg Oral Daily  . LORazepam  0-4 mg Oral Q12H  . metoprolol  50 mg Oral BID  . morphine   Intravenous 6 times per day  . multivitamin with minerals  1 tablet Oral Daily  . nicotine  14 mg Transdermal Daily  . pantoprazole  40 mg Oral BID  . pneumococcal 23 valent vaccine  0.5 mL Intramuscular Tomorrow-1000  . QUEtiapine  100 mg Oral QHS  . QUEtiapine  25 mg Oral 2 times per day  . sertraline  75 mg Oral Daily  . thiamine  100 mg Oral Daily   Continuous Infusions: . sodium chloride 100 mL/hr at 11/20/13 0821   PRN Meds:.acetaminophen, acetaminophen, albuterol, diphenhydrAMINE, diphenhydrAMINE, gi cocktail, LORazepam, LORazepam, metoprolol, naloxone, ondansetron (ZOFRAN) IV, ondansetron (ZOFRAN) IV, ondansetron, sodium chloride, zolpidem  Antibiotics: Anti-infectives   None       PHYSICAL EXAM: Vital signs in last 24 hours: Filed Vitals:   11/20/13 0812 11/20/13 1110 11/20/13 1344 11/20/13 1349  BP:  144/92 120/79   Pulse:  93 89   Temp:  98.5 F (36.9 C) 98.8 F (37.1 C)   TempSrc:  Oral Oral   Resp: 15 16 16 19   Height:      Weight:      SpO2: 96% 100% 98% 100%    Weight change:  Filed Weights   11/17/13 1443  Weight: 62.143 kg (137 lb)   Body mass index is 20.22 kg/(m^2).   Gen Exam: Awake and alert with clear speech.   Neck: Supple, No JVD.   Chest: B/L Clear.   CVS: S1 S2 Regular, no murmurs.  Abdomen: soft, BS +, moderately tender in the epigastric area, non distended.  Extremities: no edema, lower extremities warm to touch. Neurologic: Non Focal.  Skin: No Rash.   Wounds: N/A.    Intake/Output from previous day:  Intake/Output Summary (Last 24 hours) at 11/20/13 1427 Last data filed at 11/20/13 1421  Gross per 24 hour  Intake 1517.5 ml  Output   4175 ml  Net -2657.5 ml     LAB RESULTS: CBC  Recent Labs Lab 11/17/13 1450 11/18/13 0740 11/19/13 0625  WBC 9.0 7.9 10.0  HGB 13.9 10.9* 10.1*  HCT 41.6 33.7* 31.5*  PLT 260 223 176  MCV  95.2 95.7 96.6  MCH 31.8 31.0 31.0  MCHC 33.4 32.3 32.1  RDW 16.4* 16.2* 16.0*  LYMPHSABS 3.0  --   --   MONOABS 0.8  --   --   EOSABS 0.1  --   --   BASOSABS 0.0  --   --     Chemistries   Recent Labs Lab 11/17/13 1450 11/18/13 0740 11/20/13 0740  NA 129* 135* 143  K 4.1 3.9 3.8  CL 88* 102 107  CO2 17* 19 24  GLUCOSE 87 77 78  BUN 12 6 <3*  CREATININE 0.62 0.60 0.54  CALCIUM 9.4 8.6 8.3*    CBG: No results found for this basename: GLUCAP,  in the last 168 hours  GFR Estimated Creatinine Clearance: 103.5 ml/min (by C-G formula based on Cr of 0.54).  Coagulation profile No results found for this basename: INR, PROTIME,  in the last 168 hours  Cardiac Enzymes No results found for this basename: CK, CKMB, TROPONINI, MYOGLOBIN,  in the last 168 hours  No components found with this basename: POCBNP,  No results found for this basename: DDIMER,  in the last 72 hours No results found for this basename: HGBA1C,  in the last 72 hours No results found for this basename: CHOL, HDL, LDLCALC, TRIG, CHOLHDL, LDLDIRECT,  in the last 72 hours No results found for this basename: TSH, T4TOTAL, FREET3, T3FREE, THYROIDAB,  in the last 72 hours No results found for this basename: VITAMINB12, FOLATE, FERRITIN, TIBC, IRON, RETICCTPCT,  in the last 72 hours  Recent Labs  11/17/13 1450  11/19/13 0915 11/20/13 0740  LIPASE  --   < > 712* 391*  AMYLASE 94  --   --   --   < > = values in this interval not displayed.  Urine Studies No results found for this basename: UACOL, UAPR, USPG, UPH, UTP, UGL, UKET, UBIL, UHGB, UNIT, UROB, ULEU, UEPI, UWBC, URBC, UBAC, CAST, CRYS, UCOM, BILUA,  in the last 72 hours  MICROBIOLOGY: No results found for this or any previous visit (from the past 240 hour(s)).  RADIOLOGY STUDIES/RESULTS: Mr Abdomen W Wo Contrast  11/20/2013   CLINICAL DATA:  Acute pancreatitis. Indeterminate pancreatic lesion seen on recent ultrasound.  EXAM: MRI ABDOMEN WITHOUT  AND WITH CONTRAST  TECHNIQUE: Multiplanar multisequence MR imaging of the abdomen was performed both before and after the administration of intravenous contrast.  CONTRAST:  62mL MULTIHANCE GADOBENATE DIMEGLUMINE 529 MG/ML IV SOLN  COMPARISON:  Abdomen ultrasound on 11/18/2013 and prior CT on 08/17/2012  FINDINGS: Moderate diffuse pancreatic edema and peripancreatic inflammatory changes are seen, consistent with acute pancreatitis. 2-3 tiny cystic foci are seen in the pancreatic body measuring up up to 11 mm, and several other tiny less than 1 cm cystic foci are seen in the pancreatic uncinate process. These most likely represent tiny pancreatic pseudocysts in the setting of pancreatitis. No solid pancreatic masses are identified. No evidence of pancreatic ductal dilatation.  No significant biliary  ductal dilatation with common bile duct measuring 6 mm. Gallbladder is unremarkable. No liver masses are identified. No evidence of portal or splenic vein thrombosis. No evidence of splenomegaly. The adrenal glands and kidneys are normal in appearance. No evidence hydronephrosis Mild ascites noted in both the right and left upper quadrants.  IMPRESSION: Moderate acute pancreatitis, with mild abdominal ascites.  Several tiny cystic foci in the pancreatic uncinate process and body measuring up to 1 cm, most likely representing tiny pancreatic pseudocysts in the setting of pancreatitis. No evidence of pancreatic mass.   Electronically Signed   By: Earle Gell M.D.   On: 11/20/2013 12:04   US Abdomen Complete  11/18/2013   CLINICAL DATA:  Abdominal pain.  Pancreatitis.  EXAM: ULTRASOUND ABDOMEN COMPLETE  COMPARISON:  Ultrasound 10/19/2013 and CT on 08/17/2012  FINDINGS: Gallbladder:  No gallstones or wall thickening visualized. No sonographic Murphy sign noted.  Common bile duct:  Diameter: 8 mm, which is mildly dilated  Liver:  No focal lesion identified. Within normal limits in parenchymal echogenicity.  IVC:  No  abnormality visualized.  Pancreas:  Enlarged with heterogeneous echotexture, suspicious for acute pancreatitis. 8 mm hypoechoic nodule along the ventral surface of the pancreatic body, which could represent a complex cyst or solid mass.  Spleen:  Size and appearance within normal limits.  Right Kidney:  Length: 11.7 cm. Echogenicity within normal limits. No mass or hydronephrosis visualized.  Left Kidney:  Length: 11.0 cm. Echogenicity within normal limits. No mass or hydronephrosis visualized.  Abdominal aorta:  No aneurysm visualized.  Other findings:  None.  IMPRESSION: Enlarged and heterogeneous pancreas, suspicious for acute pancreatitis. 8 mm complex cyst versus solid mass along the ventral surface of pancreatic body. Recommend abdomen MRI without and with contrast for further characterization.  Mild biliary ductal dilatation with common bile duct measuring 8 mm. This could also be further evaluated by MRCP.   Electronically Signed   By: Earle Gell M.D.   On: 11/18/2013 08:28   Dg Abd Acute W/chest  11/17/2013   CLINICAL DATA:  Abdominal pain. Left lower quadrant pain in mid sternal chest pain.  EXAM: ACUTE ABDOMEN SERIES (ABDOMEN 2 VIEW & CHEST 1 VIEW)  COMPARISON:  Chest radiograph 10/17/2013. Abdominal radiograph 10/14/2013.  FINDINGS: There is no evidence of dilated bowel loops or free intraperitoneal air. No radiopaque calculi or significant stool burden. No abdominal mass effect. There is a focal area of sclerosis and possible central lucency associated with the right tenth rib.  The heart. mediastinal, and hilar contours are normal. The lungs are well expanded and clear. The trachea is midline. There is mild gaseous distention of the upper thoracic esophagus.  IMPRESSION: 1. No acute cardiopulmonary disease. Nonobstructive bowel gas pattern. 2. Focal sclerosis associated with the right tenth rib. Question if this could reflect a healing rib fracture.   Electronically Signed   By: Curlene Dolphin M.D.    On: 11/17/2013 17:42    Oren Binet, MD  [facility] Pager:[phone]  If 7PM-7AM, please contact night-coverage [email or web address] Password [email or web address] 11/20/2013, 2:27 PM   LOS: 3 days   **Disclaimer: This note may have been dictated with voice recognition software. Similar sounding words can inadvertently be transcribed and this note may contain transcription errors which may not have been corrected upon publication of note.**

## 2013-11-21 LAB — COMPREHENSIVE METABOLIC PANEL
ALT: 31 U/L (ref 0–53)
AST: 30 U/L (ref 0–37)
Albumin: 2.6 g/dL — ABNORMAL LOW (ref 3.5–5.2)
Alkaline Phosphatase: 106 U/L (ref 39–117)
Anion gap: 12 (ref 5–15)
CALCIUM: 8.2 mg/dL — AB (ref 8.4–10.5)
CO2: 24 meq/L (ref 19–32)
Chloride: 106 mEq/L (ref 96–112)
Creatinine, Ser: 0.5 mg/dL (ref 0.50–1.35)
GLUCOSE: 65 mg/dL — AB (ref 70–99)
Potassium: 3.8 mEq/L (ref 3.7–5.3)
SODIUM: 142 meq/L (ref 137–147)
Total Bilirubin: 0.3 mg/dL (ref 0.3–1.2)
Total Protein: 6.2 g/dL (ref 6.0–8.3)

## 2013-11-21 LAB — LIPID PANEL
Cholesterol: 128 mg/dL (ref 0–200)
HDL: 57 mg/dL (ref >39)
LDL CALC: 61 mg/dL (ref 0–99)
TRIGLYCERIDES: 52 mg/dL (ref <150)
Total CHOL/HDL Ratio: 2.2 RATIO
VLDL: 10 mg/dL (ref 0–40)

## 2013-11-21 LAB — CBC
HEMATOCRIT: 29.7 % — AB (ref 39.0–52.0)
Hemoglobin: 9.4 g/dL — ABNORMAL LOW (ref 13.0–17.0)
MCH: 30.8 pg (ref 26.0–34.0)
MCHC: 31.6 g/dL (ref 30.0–36.0)
MCV: 97.4 fL (ref 78.0–100.0)
Platelets: 171 10*3/uL (ref 150–400)
RBC: 3.05 MIL/uL — ABNORMAL LOW (ref 4.22–5.81)
RDW: 15.9 % — AB (ref 11.5–15.5)
WBC: 6.9 10*3/uL (ref 4.0–10.5)

## 2013-11-21 LAB — LIPASE, BLOOD: Lipase: 214 U/L — ABNORMAL HIGH (ref 11–59)

## 2013-11-21 LAB — PHOSPHORUS: Phosphorus: 4.2 mg/dL (ref 2.3–4.6)

## 2013-11-21 LAB — MAGNESIUM: Magnesium: 1.5 mg/dL (ref 1.5–2.5)

## 2013-11-21 MED ORDER — CHLORHEXIDINE GLUCONATE 0.12 % MT SOLN
15.0000 mL | Freq: Two times a day (BID) | OROMUCOSAL | Status: DC
  Administered 2013-11-21 – 2013-11-24 (×6): 15 mL via OROMUCOSAL
  Filled 2013-11-21 (×12): qty 15

## 2013-11-21 MED ORDER — MAGNESIUM SULFATE 40 MG/ML IJ SOLN
2.0000 g | Freq: Once | INTRAMUSCULAR | Status: AC
  Administered 2013-11-21: 2 g via INTRAVENOUS
  Filled 2013-11-21: qty 50

## 2013-11-21 MED ORDER — BIOTENE DRY MOUTH MT LIQD
15.0000 mL | Freq: Two times a day (BID) | OROMUCOSAL | Status: DC
  Administered 2013-11-21 – 2013-11-24 (×6): 15 mL via OROMUCOSAL

## 2013-11-21 NOTE — Clinical Social Work Note (Signed)
Letter faxed to probation officer Kirtland Bouchard [phone]) per patient's request. Copy provided to patient. CSW signing off.   Liz Beach MSW, [place], [place], [phone]

## 2013-11-21 NOTE — Clinical Social Work Psychosocial (Signed)
Clinical Social Work Department BRIEF PSYCHOSOCIAL ASSESSMENT 11/21/2013  Patient:  [name], [name]     Account Number:  [id number]     Admit date:  11/17/2013  Clinical Social Worker:  Lovey Newcomer  Date/Time:  11/21/2013 01:14 PM  Referred by:  Physician  Date Referred:  11/21/2013 Referred for  Substance Abuse   Other Referral:   Interview type:  Patient Other interview type:   Patient alert and oriented at time of assessment.    PSYCHOSOCIAL DATA Living Status:  PARENTS Admitted from facility:   Level of care:   Primary support name:  [name] Primary support relationship to patient:  PARENT Degree of support available:   Support is fair.    CURRENT CONCERNS Current Concerns  Substance Abuse   Other Concerns:    SOCIAL WORK ASSESSMENT / PLAN CSW met with patient at bedside to complete assessment. Patient has a long history of ETOH abuse. Patient states that he has been in treatment before in the past and believes he would benefit from going to treatment again. CSW completed SBIRT with patient (please review). Patient states that he lives with his mother and plans to return to her home at discharge. CSW offered patient substance abuse resources which patient accepted. Patient requests assistance with having a letter sent to his probation officer. Patient appears drowsy from his pain medication. Patient did not really want to engage in conversation about the specifics of his alcohol use. CSW will assist.   Assessment/plan status:  No Further Intervention Required Other assessment/ plan:   NONE   Information/referral to community resources:   Substance abuse resources given, letter for probation officer will be provided.    PATIENT'S/FAMILY'S RESPONSE TO PLAN OF CARE: Patient plans to utilize substance abuse resources at discharge. CSW will create letter for patient and sign off.       Liz Beach MSW, [place], [place], [phone]

## 2013-11-21 NOTE — Progress Notes (Signed)
PATIENT DETAILS Name: [name] Age: 43 y.o. Sex: male Date of Birth: 02/10/1971 Admit Date: 11/17/2013 Admitting Physician Merton Border, MD [id number], Gabrielle Dare, MD  Subjective: Still complains of abdominal pain  Assessment/Plan: Principal Problem: Acute on Chronic Pancreatitis -likely alcoholic. Claims that after he recently got discharged from [facility] had a "few"drinks (Alcohol level on 7/12-111!!) -initially given clear liquids-but kept NPO from 7/14 given worsening pain. Unfortunately per RN patient eating "snacks", liquids when told not to. Lipase levels downtrending, claims pain is better-therefore will try clear liquids today. Continue  IVF, supportive care, Morphine PCA -Abd Ultrasound on 7/13 showed changes consistent with pancreatitis, however also showed a cystic lesion, MRI Abd on 7/15 showed moderate acute pancreatitis and small pancreatic pseudocysts measuring up to 1 cm. -follow closely  Active Problems: Etoh Abuse -awake, alert, very minimally tremulous -on Ativan per CIWA protocol. Continue with Thiamine and folate  -counseled regarding importance of quitting  Suspected Alcoholic Hepatitis -AST almost 2 times ALT-given hx of ETOH intake-high suspicion for ETOH hepatitis - LFT's have normalized  Hyponatremia -resolved -suspected beer potomania  History of depression with suicide attempts in the past 2 admits to behavior health in the left one month  -c/w Seroquel,Zoloft -denies any suicidal or homicidal ideation at present  Tachycardia with history of WPW  -c/w Metoprolol-stable  Tobacco Abuse -c/w transdermal Nicotine -counseled extensively  Disposition: Remain inpatient  DVT Prophylaxis: Prophylactic Lovenox   Code Status: Full code   Family Communication None at bedside  Procedures:  None  CONSULTS:  None  MEDICATIONS: Scheduled Meds: . antiseptic oral rinse  15 mL Mouth Rinse q12n4p  . chlorhexidine  15 mL Mouth  Rinse BID  . enoxaparin (LOVENOX) injection  40 mg Subcutaneous Q24H  . folic acid  1 mg Oral Daily  . LORazepam  0-4 mg Oral Q12H  . magnesium sulfate 1 - 4 g bolus IVPB  2 g Intravenous Once  . metoprolol  50 mg Oral BID  . morphine   Intravenous 6 times per day  . multivitamin with minerals  1 tablet Oral Daily  . nicotine  14 mg Transdermal Daily  . pantoprazole  40 mg Oral BID  . pneumococcal 23 valent vaccine  0.5 mL Intramuscular Tomorrow-1000  . QUEtiapine  100 mg Oral QHS  . QUEtiapine  25 mg Oral 2 times per day  . sertraline  75 mg Oral Daily  . thiamine  100 mg Oral Daily   Continuous Infusions: . dextrose 5 % and 0.9% NaCl 150 mL/hr (11/21/13 0647)   PRN Meds:.acetaminophen, acetaminophen, albuterol, diphenhydrAMINE, diphenhydrAMINE, gi cocktail, metoprolol, naloxone, ondansetron (ZOFRAN) IV, ondansetron (ZOFRAN) IV, ondansetron, sodium chloride, zolpidem  Antibiotics: Anti-infectives   None       PHYSICAL EXAM: Vital signs in last 24 hours: Filed Vitals:   11/21/13 0820 11/21/13 0849 11/21/13 0859 11/21/13 1042  BP:  131/86  141/92  Pulse:  90  80  Temp:    97.5 F (36.4 C)  TempSrc:    Oral  Resp: 12  14 14   Height:      Weight:      SpO2: 100%  100% 93%    Weight change:  Filed Weights   11/17/13 1443  Weight: 62.143 kg (137 lb)   Body mass index is 20.22 kg/(m^2).   Gen Exam: Awake and alert with clear speech.   Neck: Supple, No JVD.   Chest: B/L Clear.   CVS: S1 S2  Regular, no murmurs.  Abdomen: soft, BS +, moderately tender in the epigastric area, non distended.  Extremities: no edema, lower extremities warm to touch. Neurologic: Non Focal.   Skin: No Rash.   Wounds: N/A.    Intake/Output from previous day:  Intake/Output Summary (Last 24 hours) at 11/21/13 1108 Last data filed at 11/21/13 0900  Gross per 24 hour  Intake   2130 ml  Output   2700 ml  Net   -570 ml     LAB RESULTS: CBC  Recent Labs Lab 11/17/13 1450  11/18/13 0740 11/19/13 0625 11/21/13 0650  WBC 9.0 7.9 10.0 6.9  HGB 13.9 10.9* 10.1* 9.4*  HCT 41.6 33.7* 31.5* 29.7*  PLT 260 223 176 171  MCV 95.2 95.7 96.6 97.4  MCH 31.8 31.0 31.0 30.8  MCHC 33.4 32.3 32.1 31.6  RDW 16.4* 16.2* 16.0* 15.9*  LYMPHSABS 3.0  --   --   --   MONOABS 0.8  --   --   --   EOSABS 0.1  --   --   --   BASOSABS 0.0  --   --   --     Chemistries   Recent Labs Lab 11/17/13 1450 11/18/13 0740 11/20/13 0740 11/21/13 0650  NA 129* 135* 143 142  K 4.1 3.9 3.8 3.8  CL 88* 102 107 106  CO2 17* 19 24 24   GLUCOSE 87 77 78 65*  BUN 12 6 <3* <3*  CREATININE 0.62 0.60 0.54 0.50  CALCIUM 9.4 8.6 8.3* 8.2*  MG  --   --   --  1.5    CBG: No results found for this basename: GLUCAP,  in the last 168 hours  GFR Estimated Creatinine Clearance: 103.5 ml/min (by C-G formula based on Cr of 0.5).  Coagulation profile No results found for this basename: INR, PROTIME,  in the last 168 hours  Cardiac Enzymes No results found for this basename: CK, CKMB, TROPONINI, MYOGLOBIN,  in the last 168 hours  No components found with this basename: POCBNP,  No results found for this basename: DDIMER,  in the last 72 hours No results found for this basename: HGBA1C,  in the last 72 hours  Recent Labs  11/21/13 0650  CHOL 128  HDL 57  LDLCALC 61  TRIG 52  CHOLHDL 2.2   No results found for this basename: TSH, T4TOTAL, FREET3, T3FREE, THYROIDAB,  in the last 72 hours No results found for this basename: VITAMINB12, FOLATE, FERRITIN, TIBC, IRON, RETICCTPCT,  in the last 72 hours  Recent Labs  11/20/13 0740 11/21/13 0650  LIPASE 391* 214*    Urine Studies No results found for this basename: UACOL, UAPR, USPG, UPH, UTP, UGL, UKET, UBIL, UHGB, UNIT, UROB, ULEU, UEPI, UWBC, URBC, UBAC, CAST, CRYS, UCOM, BILUA,  in the last 72 hours  MICROBIOLOGY: No results found for this or any previous visit (from the past 240 hour(s)).  RADIOLOGY STUDIES/RESULTS: Mr  Abdomen W Wo Contrast  11/20/2013   CLINICAL DATA:  Acute pancreatitis. Indeterminate pancreatic lesion seen on recent ultrasound.  EXAM: MRI ABDOMEN WITHOUT AND WITH CONTRAST  TECHNIQUE: Multiplanar multisequence MR imaging of the abdomen was performed both before and after the administration of intravenous contrast.  CONTRAST:  73mL MULTIHANCE GADOBENATE DIMEGLUMINE 529 MG/ML IV SOLN  COMPARISON:  Abdomen ultrasound on 11/18/2013 and prior CT on 08/17/2012  FINDINGS: Moderate diffuse pancreatic edema and peripancreatic inflammatory changes are seen, consistent with acute pancreatitis. 2-3 tiny cystic foci are seen in  the pancreatic body measuring up up to 11 mm, and several other tiny less than 1 cm cystic foci are seen in the pancreatic uncinate process. These most likely represent tiny pancreatic pseudocysts in the setting of pancreatitis. No solid pancreatic masses are identified. No evidence of pancreatic ductal dilatation.  No significant biliary ductal dilatation with common bile duct measuring 6 mm. Gallbladder is unremarkable. No liver masses are identified. No evidence of portal or splenic vein thrombosis. No evidence of splenomegaly. The adrenal glands and kidneys are normal in appearance. No evidence hydronephrosis Mild ascites noted in both the right and left upper quadrants.  IMPRESSION: Moderate acute pancreatitis, with mild abdominal ascites.  Several tiny cystic foci in the pancreatic uncinate process and body measuring up to 1 cm, most likely representing tiny pancreatic pseudocysts in the setting of pancreatitis. No evidence of pancreatic mass.   Electronically Signed   By: Earle Gell M.D.   On: 11/20/2013 12:04   US Abdomen Complete  11/18/2013   CLINICAL DATA:  Abdominal pain.  Pancreatitis.  EXAM: ULTRASOUND ABDOMEN COMPLETE  COMPARISON:  Ultrasound 10/19/2013 and CT on 08/17/2012  FINDINGS: Gallbladder:  No gallstones or wall thickening visualized. No sonographic Murphy sign noted.   Common bile duct:  Diameter: 8 mm, which is mildly dilated  Liver:  No focal lesion identified. Within normal limits in parenchymal echogenicity.  IVC:  No abnormality visualized.  Pancreas:  Enlarged with heterogeneous echotexture, suspicious for acute pancreatitis. 8 mm hypoechoic nodule along the ventral surface of the pancreatic body, which could represent a complex cyst or solid mass.  Spleen:  Size and appearance within normal limits.  Right Kidney:  Length: 11.7 cm. Echogenicity within normal limits. No mass or hydronephrosis visualized.  Left Kidney:  Length: 11.0 cm. Echogenicity within normal limits. No mass or hydronephrosis visualized.  Abdominal aorta:  No aneurysm visualized.  Other findings:  None.  IMPRESSION: Enlarged and heterogeneous pancreas, suspicious for acute pancreatitis. 8 mm complex cyst versus solid mass along the ventral surface of pancreatic body. Recommend abdomen MRI without and with contrast for further characterization.  Mild biliary ductal dilatation with common bile duct measuring 8 mm. This could also be further evaluated by MRCP.   Electronically Signed   By: Earle Gell M.D.   On: 11/18/2013 08:28   Dg Abd Acute W/chest  11/17/2013   CLINICAL DATA:  Abdominal pain. Left lower quadrant pain in mid sternal chest pain.  EXAM: ACUTE ABDOMEN SERIES (ABDOMEN 2 VIEW & CHEST 1 VIEW)  COMPARISON:  Chest radiograph 10/17/2013. Abdominal radiograph 10/14/2013.  FINDINGS: There is no evidence of dilated bowel loops or free intraperitoneal air. No radiopaque calculi or significant stool burden. No abdominal mass effect. There is a focal area of sclerosis and possible central lucency associated with the right tenth rib.  The heart. mediastinal, and hilar contours are normal. The lungs are well expanded and clear. The trachea is midline. There is mild gaseous distention of the upper thoracic esophagus.  IMPRESSION: 1. No acute cardiopulmonary disease. Nonobstructive bowel gas pattern. 2.  Focal sclerosis associated with the right tenth rib. Question if this could reflect a healing rib fracture.   Electronically Signed   By: Curlene Dolphin M.D.   On: 11/17/2013 17:42    Oren Binet, MD  [facility] Pager:[phone]  If 7PM-7AM, please contact night-coverage [email or web address] Password [email or web address] 11/21/2013, 11:08 AM   LOS: 4 days   **Disclaimer: This note may have been dictated with voice recognition software. Similar  sounding words can inadvertently be transcribed and this note may contain transcription errors which may not have been corrected upon publication of note.**

## 2013-11-22 DIAGNOSIS — F332 Major depressive disorder, recurrent severe without psychotic features: Secondary | ICD-10-CM

## 2013-11-22 LAB — BASIC METABOLIC PANEL
ANION GAP: 13 (ref 5–15)
CALCIUM: 8.8 mg/dL (ref 8.4–10.5)
CO2: 23 mEq/L (ref 19–32)
CREATININE: 0.5 mg/dL (ref 0.50–1.35)
Chloride: 107 mEq/L (ref 96–112)
GFR calc Af Amer: >90 mL/min (ref >90)
Glucose, Bld: 75 mg/dL (ref 70–99)
Potassium: 4.1 mEq/L (ref 3.7–5.3)
Sodium: 143 mEq/L (ref 137–147)

## 2013-11-22 LAB — LIPASE, BLOOD: LIPASE: 162 U/L — AB (ref 11–59)

## 2013-11-22 MED ORDER — SENNA 8.6 MG PO TABS
2.0000 | ORAL_TABLET | Freq: Every day | ORAL | Status: DC
  Administered 2013-11-23: 17.2 mg via ORAL
  Filled 2013-11-22 (×3): qty 2

## 2013-11-22 MED ORDER — DIPHENHYDRAMINE HCL 50 MG/ML IJ SOLN: 12.5000 mg | Freq: Four times a day (QID) | INTRAMUSCULAR | Status: DC | PRN

## 2013-11-22 MED ORDER — MORPHINE SULFATE (PF) 1 MG/ML IV SOLN: INTRAVENOUS | Status: DC

## 2013-11-22 MED ORDER — MORPHINE SULFATE (PF) 1 MG/ML IV SOLN
INTRAVENOUS | Status: DC
  Administered 2013-11-22: 9 mg via INTRAVENOUS

## 2013-11-22 MED ORDER — ENSURE COMPLETE PO LIQD
237.0000 mL | Freq: Two times a day (BID) | ORAL | Status: DC
  Administered 2013-11-23 – 2013-11-26 (×8): 237 mL via ORAL

## 2013-11-22 MED ORDER — MORPHINE SULFATE (PF) 1 MG/ML IV SOLN
INTRAVENOUS | Status: DC
  Administered 2013-11-22: 22.1 mg via INTRAVENOUS

## 2013-11-22 MED ORDER — MORPHINE SULFATE (PF) 1 MG/ML IV SOLN
INTRAVENOUS | Status: DC
  Administered 2013-11-22: 1 mg via INTRAVENOUS
  Filled 2013-11-22: qty 25

## 2013-11-22 MED ORDER — OXYCODONE HCL 5 MG PO TABS: 5.0000 mg | ORAL_TABLET | ORAL | Status: DC | PRN

## 2013-11-22 MED ORDER — POLYETHYLENE GLYCOL 3350 17 G PO PACK
17.0000 g | PACK | Freq: Every day | ORAL | Status: DC
  Administered 2013-11-22 – 2013-11-23 (×2): 17 g via ORAL
  Filled 2013-11-22 (×3): qty 1

## 2013-11-22 MED ORDER — HYDROMORPHONE HCL PF 1 MG/ML IJ SOLN
1.0000 mg | INTRAMUSCULAR | Status: DC | PRN
  Administered 2013-11-22 – 2013-11-23 (×14): 1 mg via INTRAVENOUS
  Filled 2013-11-22 (×14): qty 1

## 2013-11-22 MED ORDER — DIPHENHYDRAMINE HCL 12.5 MG/5ML PO ELIX
12.5000 mg | ORAL_SOLUTION | Freq: Four times a day (QID) | ORAL | Status: DC | PRN
  Filled 2013-11-22: qty 5

## 2013-11-22 NOTE — Progress Notes (Signed)
Nursing Note: Pt IV beeping and went in and pt was messing w/ IV and it was unlocked. Pt cannot access PCA w/o key but was pushing buttons. Passed on to day shift nurse and left note for MD.wbb

## 2013-11-22 NOTE — Progress Notes (Signed)
NUTRITION FOLLOW-UP  DOCUMENTATION CODES Per approved criteria  -Severe malnutrition in the context of chronic illness   Pt meets criteria for severe MALNUTRITION in the context of chronic illness as evidenced by severe depletion of muscle mass and fat mass, and 7% weight loss x 2 months and intake of <75% x at least 1 month.  INTERVENTION: Recommend Ensure Complete po BID, each supplement provides 350 kcal and 13 grams of protein, once diet order permits. RD to continue to follow nutrition care plan.  NUTRITION DIAGNOSIS: Inadequate oral intake related to altered GI function as evidenced by need for slow diet advancement. Ongoing.  Goal: Intake to meet >90% of estimated nutrition needs. Further diet advancement.  Monitor:  weight trends, lab trends, I/O's, PO intake, supplement tolerance, further diet advancement  ASSESSMENT: PMHx significant for chronic pancreatitis, substance abuse, and hx of suicide attempts. Admitted with n/v/d and abdominal pain x 2 days. Work-up reveals chronic pancreatitis.  Agreeable to Ensure once diet order permits. Continues on Clear Liquids. Discussed oral intake with RN. Pt will likely remain on Clear Liquids today 2/2 ongoing pain.  Sodium WNL Potassium WNL Phosphorus WNL Magnesium WNL  Height: Ht Readings from Last 1 Encounters:  11/17/13 5\' 9"  (1.753 m)    Weight: Wt Readings from Last 1 Encounters:  11/17/13 137 lb (62.143 kg)   BMI:  Body mass index is 20.22 kg/(m^2). WNL  Estimated Nutritional Needs: Kcal: 1800 - 2000 Protein: at least 93 g daily Fluid: 1.8 - 2 liters  Skin: intact  Diet Order: Clear Liquid   Intake/Output Summary (Last 24 hours) at 11/22/13 1059 Last data filed at 11/22/13 0656  Gross per 24 hour  Intake 2892.5 ml  Output   5750 ml  Net -2857.5 ml    Last BM: 7/16  Labs:   Recent Labs Lab 11/20/13 0740 11/21/13 0650 11/22/13 0500  NA 143 142 143  K 3.8 3.8 4.1  CL 107 106 107  CO2 24 24 23    BUN <3* <3* <3*  CREATININE 0.54 0.50 0.50  CALCIUM 8.3* 8.2* 8.8  MG  --  1.5  --   PHOS  --  4.2  --   GLUCOSE 78 65* 75    CBG (last 3)  No results found for this basename: GLUCAP,  in the last 72 hours  Scheduled Meds: . antiseptic oral rinse  15 mL Mouth Rinse q12n4p  . chlorhexidine  15 mL Mouth Rinse BID  . enoxaparin (LOVENOX) injection  40 mg Subcutaneous Q24H  . folic acid  1 mg Oral Daily  . metoprolol  50 mg Oral BID  . multivitamin with minerals  1 tablet Oral Daily  . nicotine  14 mg Transdermal Daily  . pantoprazole  40 mg Oral BID  . pneumococcal 23 valent vaccine  0.5 mL Intramuscular Tomorrow-1000  . polyethylene glycol  17 g Oral Daily  . QUEtiapine  100 mg Oral QHS  . QUEtiapine  25 mg Oral 2 times per day  . senna  2 tablet Oral QHS  . sertraline  75 mg Oral Daily  . thiamine  100 mg Oral Daily    Continuous Infusions: . dextrose 5 % and 0.9% NaCl 150 mL/hr at 11/22/13 Florien MS, RD, LDN Inpatient Registered Dietitian Pager: [phone] After-hours pager: [phone]

## 2013-11-22 NOTE — Progress Notes (Signed)
Nursing Note: [name] Etco2 alarm sounded at resp were 9.Counted pt's respirations and they were 9.Informed pt that he is likely getting too much medicine.Pt became angry and made this nurse aware that he was not getting too much medicine.Paged on-call.K.Schorr.[name] pump had been sounding earlier and this nurse obtained a new co2 module and it worked fine till the last 30 minutes to 1 hour ,it kept alarming and I went in to arouse pt and get him to take deep breaths.At

## 2013-11-22 NOTE — Progress Notes (Signed)
PATIENT DETAILS Name: [name] Age: 43 y.o. Sex: male Date of Birth: 08/10/1970 Admit Date: 11/17/2013 Admitting Physician Merton Border, MD [id number], Gabrielle Dare, MD  Subjective: Still complains of abdominal pain  Assessment/Plan: Principal Problem: Acute on Chronic Pancreatitis -likely alcoholic. Claims that after he recently got discharged from [facility] had a "few"drinks (Alcohol level on 7/12-111!!) -initially given clear liquids-but kept NPO from 7/14 given worsening pain. Unfortunately per RN patient eating "snacks", liquids when told not to. Lipase levels downtrending, claims pain is better-therefore started on clear liquids on 7/16-continue. Continue  IVF, supportive care. Stop Morphine PCA-transition to IV prn Dilaudid -Abd Ultrasound on 7/13 showed changes consistent with pancreatitis, however also showed a cystic lesion, MRI Abd on 7/15 showed moderate acute pancreatitis and small pancreatic pseudocysts measuring up to 1 cm. -follow closely  Active Problems: Etoh Abuse -awake, alert, very minimally tremulous -on Ativan per CIWA protocol. Continue with Thiamine and folate  -counseled regarding importance of quitting  Suspected Alcoholic Hepatitis -AST almost 2 times ALT-given hx of ETOH intake-high suspicion for ETOH hepatitis - LFT's have normalized  Hyponatremia -resolved -suspected beer potomania  History of depression with suicide attempts in the past 2 admits to behavior health in the left one month  -c/w Seroquel,Zoloft -denies any suicidal or homicidal ideation at present  Tachycardia with history of WPW  -c/w Metoprolol-stable  Tobacco Abuse -c/w transdermal Nicotine -counseled extensively  Disposition: Remain inpatient  DVT Prophylaxis: Prophylactic Lovenox   Code Status: Full code   Family Communication None at bedside  Procedures:  None  CONSULTS:  None  MEDICATIONS: Scheduled Meds: . antiseptic oral rinse  15 mL Mouth  Rinse q12n4p  . chlorhexidine  15 mL Mouth Rinse BID  . enoxaparin (LOVENOX) injection  40 mg Subcutaneous Q24H  . feeding supplement (ENSURE COMPLETE)  237 mL Oral BID BM  . folic acid  1 mg Oral Daily  . metoprolol  50 mg Oral BID  . multivitamin with minerals  1 tablet Oral Daily  . nicotine  14 mg Transdermal Daily  . pantoprazole  40 mg Oral BID  . pneumococcal 23 valent vaccine  0.5 mL Intramuscular Tomorrow-1000  . polyethylene glycol  17 g Oral Daily  . QUEtiapine  100 mg Oral QHS  . QUEtiapine  25 mg Oral 2 times per day  . senna  2 tablet Oral QHS  . sertraline  75 mg Oral Daily  . thiamine  100 mg Oral Daily   Continuous Infusions: . dextrose 5 % and 0.9% NaCl 150 mL/hr at 11/22/13 1200   PRN Meds:.acetaminophen, acetaminophen, albuterol, diphenhydrAMINE, diphenhydrAMINE, gi cocktail, HYDROmorphone (DILAUDID) injection, metoprolol, ondansetron (ZOFRAN) IV, ondansetron, oxyCODONE, zolpidem  Antibiotics: Anti-infectives   None       PHYSICAL EXAM: Vital signs in last 24 hours: Filed Vitals:   11/22/13 0358 11/22/13 0444 11/22/13 0749 11/22/13 0954  BP:  143/94  155/98  Pulse:  85  93  Temp:  98.3 F (36.8 C)  98.3 F (36.8 C)  TempSrc:  Oral  Oral  Resp: 11 12 12 16   Height:      Weight:      SpO2: 100% 100% 100% 100%    Weight change:  Filed Weights   11/17/13 1443  Weight: 62.143 kg (137 lb)   Body mass index is 20.22 kg/(m^2).   Gen Exam: Awake and alert with clear speech.   Neck: Supple, No JVD.   Chest: B/L Clear.  No rales or rhonchi CVS: S1 S2 Regular, no murmurs.  Abdomen: soft, BS +, still with tenderness in the epigastric area, non distended.  Extremities: no edema, lower extremities warm to touch. Neurologic: Non Focal.   Skin: No Rash.   Wounds: N/A.    Intake/Output from previous day:  Intake/Output Summary (Last 24 hours) at 11/22/13 1243 Last data filed at 11/22/13 0656  Gross per 24 hour  Intake 2842.5 ml  Output   5350 ml    Net -2507.5 ml     LAB RESULTS: CBC  Recent Labs Lab 11/17/13 1450 11/18/13 0740 11/19/13 0625 11/21/13 0650  WBC 9.0 7.9 10.0 6.9  HGB 13.9 10.9* 10.1* 9.4*  HCT 41.6 33.7* 31.5* 29.7*  PLT 260 223 176 171  MCV 95.2 95.7 96.6 97.4  MCH 31.8 31.0 31.0 30.8  MCHC 33.4 32.3 32.1 31.6  RDW 16.4* 16.2* 16.0* 15.9*  LYMPHSABS 3.0  --   --   --   MONOABS 0.8  --   --   --   EOSABS 0.1  --   --   --   BASOSABS 0.0  --   --   --     Chemistries   Recent Labs Lab 11/17/13 1450 11/18/13 0740 11/20/13 0740 11/21/13 0650 11/22/13 0500  NA 129* 135* 143 142 143  K 4.1 3.9 3.8 3.8 4.1  CL 88* 102 107 106 107  CO2 17* 19 24 24 23   GLUCOSE 87 77 78 65* 75  BUN 12 6 <3* <3* <3*  CREATININE 0.62 0.60 0.54 0.50 0.50  CALCIUM 9.4 8.6 8.3* 8.2* 8.8  MG  --   --   --  1.5  --     CBG: No results found for this basename: GLUCAP,  in the last 168 hours  GFR Estimated Creatinine Clearance: 103.5 ml/min (by C-G formula based on Cr of 0.5).  Coagulation profile No results found for this basename: INR, PROTIME,  in the last 168 hours  Cardiac Enzymes No results found for this basename: CK, CKMB, TROPONINI, MYOGLOBIN,  in the last 168 hours  No components found with this basename: POCBNP,  No results found for this basename: DDIMER,  in the last 72 hours No results found for this basename: HGBA1C,  in the last 72 hours  Recent Labs  11/21/13 0650  CHOL 128  HDL 57  LDLCALC 61  TRIG 52  CHOLHDL 2.2   No results found for this basename: TSH, T4TOTAL, FREET3, T3FREE, THYROIDAB,  in the last 72 hours No results found for this basename: VITAMINB12, FOLATE, FERRITIN, TIBC, IRON, RETICCTPCT,  in the last 72 hours  Recent Labs  11/21/13 0650 11/22/13 0500  LIPASE 214* 162*    Urine Studies No results found for this basename: UACOL, UAPR, USPG, UPH, UTP, UGL, UKET, UBIL, UHGB, UNIT, UROB, ULEU, UEPI, UWBC, URBC, UBAC, CAST, CRYS, UCOM, BILUA,  in the last 72  hours  MICROBIOLOGY: No results found for this or any previous visit (from the past 240 hour(s)).  RADIOLOGY STUDIES/RESULTS: Mr Abdomen W Wo Contrast  11/20/2013   CLINICAL DATA:  Acute pancreatitis. Indeterminate pancreatic lesion seen on recent ultrasound.  EXAM: MRI ABDOMEN WITHOUT AND WITH CONTRAST  TECHNIQUE: Multiplanar multisequence MR imaging of the abdomen was performed both before and after the administration of intravenous contrast.  CONTRAST:  30mL MULTIHANCE GADOBENATE DIMEGLUMINE 529 MG/ML IV SOLN  COMPARISON:  Abdomen ultrasound on 11/18/2013 and prior CT on 08/17/2012  FINDINGS: Moderate diffuse pancreatic  edema and peripancreatic inflammatory changes are seen, consistent with acute pancreatitis. 2-3 tiny cystic foci are seen in the pancreatic body measuring up up to 11 mm, and several other tiny less than 1 cm cystic foci are seen in the pancreatic uncinate process. These most likely represent tiny pancreatic pseudocysts in the setting of pancreatitis. No solid pancreatic masses are identified. No evidence of pancreatic ductal dilatation.  No significant biliary ductal dilatation with common bile duct measuring 6 mm. Gallbladder is unremarkable. No liver masses are identified. No evidence of portal or splenic vein thrombosis. No evidence of splenomegaly. The adrenal glands and kidneys are normal in appearance. No evidence hydronephrosis Mild ascites noted in both the right and left upper quadrants.  IMPRESSION: Moderate acute pancreatitis, with mild abdominal ascites.  Several tiny cystic foci in the pancreatic uncinate process and body measuring up to 1 cm, most likely representing tiny pancreatic pseudocysts in the setting of pancreatitis. No evidence of pancreatic mass.   Electronically Signed   By: Earle Gell M.D.   On: 11/20/2013 12:04   US Abdomen Complete  11/18/2013   CLINICAL DATA:  Abdominal pain.  Pancreatitis.  EXAM: ULTRASOUND ABDOMEN COMPLETE  COMPARISON:  Ultrasound  10/19/2013 and CT on 08/17/2012  FINDINGS: Gallbladder:  No gallstones or wall thickening visualized. No sonographic Murphy sign noted.  Common bile duct:  Diameter: 8 mm, which is mildly dilated  Liver:  No focal lesion identified. Within normal limits in parenchymal echogenicity.  IVC:  No abnormality visualized.  Pancreas:  Enlarged with heterogeneous echotexture, suspicious for acute pancreatitis. 8 mm hypoechoic nodule along the ventral surface of the pancreatic body, which could represent a complex cyst or solid mass.  Spleen:  Size and appearance within normal limits.  Right Kidney:  Length: 11.7 cm. Echogenicity within normal limits. No mass or hydronephrosis visualized.  Left Kidney:  Length: 11.0 cm. Echogenicity within normal limits. No mass or hydronephrosis visualized.  Abdominal aorta:  No aneurysm visualized.  Other findings:  None.  IMPRESSION: Enlarged and heterogeneous pancreas, suspicious for acute pancreatitis. 8 mm complex cyst versus solid mass along the ventral surface of pancreatic body. Recommend abdomen MRI without and with contrast for further characterization.  Mild biliary ductal dilatation with common bile duct measuring 8 mm. This could also be further evaluated by MRCP.   Electronically Signed   By: Earle Gell M.D.   On: 11/18/2013 08:28   Dg Abd Acute W/chest  11/17/2013   CLINICAL DATA:  Abdominal pain. Left lower quadrant pain in mid sternal chest pain.  EXAM: ACUTE ABDOMEN SERIES (ABDOMEN 2 VIEW & CHEST 1 VIEW)  COMPARISON:  Chest radiograph 10/17/2013. Abdominal radiograph 10/14/2013.  FINDINGS: There is no evidence of dilated bowel loops or free intraperitoneal air. No radiopaque calculi or significant stool burden. No abdominal mass effect. There is a focal area of sclerosis and possible central lucency associated with the right tenth rib.  The heart. mediastinal, and hilar contours are normal. The lungs are well expanded and clear. The trachea is midline. There is mild  gaseous distention of the upper thoracic esophagus.  IMPRESSION: 1. No acute cardiopulmonary disease. Nonobstructive bowel gas pattern. 2. Focal sclerosis associated with the right tenth rib. Question if this could reflect a healing rib fracture.   Electronically Signed   By: Curlene Dolphin M.D.   On: 11/17/2013 17:42    Oren Binet, MD  [facility] Pager:[phone]  If 7PM-7AM, please contact night-coverage [email or web address] Password [facility] 11/22/2013, 12:43 PM  LOS: 5 days   **Disclaimer: This note may have been dictated with voice recognition software. Similar sounding words can inadvertently be transcribed and this note may contain transcription errors which may not have been corrected upon publication of note.**

## 2013-11-22 NOTE — Progress Notes (Signed)
Nursing Note: MD on unit and made aware that pt has had low resp rate and still wants pain meds and that pt unlocks IV pump.wbb

## 2013-11-23 DIAGNOSIS — E43 Unspecified severe protein-calorie malnutrition: Secondary | ICD-10-CM

## 2013-11-23 LAB — COMPREHENSIVE METABOLIC PANEL
ALT: 30 U/L (ref 0–53)
AST: 33 U/L (ref 0–37)
Albumin: 2.5 g/dL — ABNORMAL LOW (ref 3.5–5.2)
Alkaline Phosphatase: 107 U/L (ref 39–117)
Anion gap: 11 (ref 5–15)
CALCIUM: 8.6 mg/dL (ref 8.4–10.5)
CHLORIDE: 105 meq/L (ref 96–112)
CO2: 23 meq/L (ref 19–32)
CREATININE: 0.6 mg/dL (ref 0.50–1.35)
GFR calc Af Amer: >90 mL/min (ref >90)
GFR calc non Af Amer: >90 mL/min (ref >90)
Glucose, Bld: 64 mg/dL — ABNORMAL LOW (ref 70–99)
Potassium: 4.2 mEq/L (ref 3.7–5.3)
Sodium: 139 mEq/L (ref 137–147)
Total Bilirubin: 0.3 mg/dL (ref 0.3–1.2)
Total Protein: 6.1 g/dL (ref 6.0–8.3)

## 2013-11-23 LAB — LIPASE, BLOOD: LIPASE: 117 U/L — AB (ref 11–59)

## 2013-11-23 LAB — CLOSTRIDIUM DIFFICILE BY PCR: CDIFFPCR: NEGATIVE

## 2013-11-23 MED ORDER — HYDROMORPHONE HCL PF 1 MG/ML IJ SOLN
1.0000 mg | INTRAMUSCULAR | Status: DC | PRN
  Administered 2013-11-23 – 2013-11-24 (×4): 1 mg via INTRAVENOUS
  Filled 2013-11-23 (×4): qty 1

## 2013-11-23 MED ORDER — OXYCODONE HCL 5 MG PO TABS
10.0000 mg | ORAL_TABLET | ORAL | Status: DC | PRN
  Administered 2013-11-23 – 2013-11-24 (×6): 10 mg via ORAL
  Filled 2013-11-23 (×6): qty 2

## 2013-11-23 NOTE — Progress Notes (Signed)
PATIENT DETAILS Name: [name] Age: 43 y.o. Sex: male Date of Birth: 03-Jan-1971 Admit Date: 11/17/2013 Admitting Physician Merton Border, MD [id number], Gabrielle Dare, MD  Subjective: Still complains of abdominal pain  Assessment/Plan: Principal Problem: Acute on Chronic Pancreatitis -likely alcoholic. Claims that after he recently got discharged from [facility] had a "few"drinks (Alcohol level on 7/12-111!!) -Initially given clear liquids-but kept NPO from 7/14 given worsening pain. Unfortunately per RN patient eating "snacks", liquids when told not to. Lipase levels downtrending, claims pain is better-therefore started on clear liquids on 7/16, appears stable will advance to full liquids.-continue. Continue  IVF, supportive care. Have stopped Morphine PCA-transition to IV prn Dilaudid -Abd Ultrasound on 7/13 showed changes consistent with pancreatitis, however also showed a cystic lesion, MRI Abd on 7/15 showed moderate acute pancreatitis and small pancreatic pseudocysts measuring up to 1 cm. -follow closely  Active Problems: Etoh Abuse -awake, alert, very minimally tremulous -on Ativan per CIWA protocol. Continue with Thiamine and folate  -counseled regarding importance of quitting  Suspected Alcoholic Hepatitis -AST almost 2 times ALT-given hx of ETOH intake-high suspicion for ETOH hepatitis - LFT's have normalized  Hyponatremia -resolved -suspected beer potomania  History of depression with suicide attempts in the past 2 admits to behavior health in the left one month  -c/w Seroquel,Zoloft -denies any suicidal or homicidal ideation at present  Tachycardia with history of WPW  -c/w Metoprolol-stable  Tobacco Abuse -c/w transdermal Nicotine -counseled extensively  Severe Malnutrition -in the context of chronic illness, continue with supplements  Disposition: Remain inpatient  DVT Prophylaxis: Prophylactic Lovenox   Code Status: Full code   Family  Communication None at bedside  Procedures:  None  CONSULTS:  None  MEDICATIONS: Scheduled Meds: . antiseptic oral rinse  15 mL Mouth Rinse q12n4p  . chlorhexidine  15 mL Mouth Rinse BID  . enoxaparin (LOVENOX) injection  40 mg Subcutaneous Q24H  . feeding supplement (ENSURE COMPLETE)  237 mL Oral BID BM  . folic acid  1 mg Oral Daily  . metoprolol  50 mg Oral BID  . multivitamin with minerals  1 tablet Oral Daily  . nicotine  14 mg Transdermal Daily  . pantoprazole  40 mg Oral BID  . pneumococcal 23 valent vaccine  0.5 mL Intramuscular Tomorrow-1000  . polyethylene glycol  17 g Oral Daily  . QUEtiapine  100 mg Oral QHS  . QUEtiapine  25 mg Oral 2 times per day  . senna  2 tablet Oral QHS  . sertraline  75 mg Oral Daily  . thiamine  100 mg Oral Daily   Continuous Infusions: . dextrose 5 % and 0.9% NaCl 200 mL (11/23/13 0817)   PRN Meds:.acetaminophen, acetaminophen, albuterol, diphenhydrAMINE, diphenhydrAMINE, gi cocktail, HYDROmorphone (DILAUDID) injection, metoprolol, ondansetron (ZOFRAN) IV, ondansetron, oxyCODONE, zolpidem  Antibiotics: Anti-infectives   None       PHYSICAL EXAM: Vital signs in last 24 hours: Filed Vitals:   11/22/13 2104 11/23/13 0015 11/23/13 0400 11/23/13 0607  BP: 164/104 156/88 177/117 144/88  Pulse: 93 88 78   Temp: 98.3 F (36.8 C) 98.5 F (36.9 C) 97.7 F (36.5 C)   TempSrc: Oral Oral Oral   Resp: 18 18 18    Height:      Weight:      SpO2: 100% 100% 100%     Weight change:  Filed Weights   11/17/13 1443  Weight: 62.143 kg (137 lb)   Body mass index is 20.22 kg/(m^2).  Gen Exam: Awake and alert with clear speech.   Neck: Supple, No JVD.   Chest: B/L Clear.  No rales or rhonchi CVS: S1 S2 Regular, no murmurs.  Abdomen: soft, BS +, still with tenderness in the epigastric area, non distended.  Extremities: no edema, lower extremities warm to touch. Neurologic: Non Focal.   Skin: No Rash.   Wounds: N/A.     Intake/Output from previous day:  Intake/Output Summary (Last 24 hours) at 11/23/13 0954 Last data filed at 11/23/13 0858  Gross per 24 hour  Intake   1240 ml  Output    575 ml  Net    665 ml     LAB RESULTS: CBC  Recent Labs Lab 11/17/13 1450 11/18/13 0740 11/19/13 0625 11/21/13 0650  WBC 9.0 7.9 10.0 6.9  HGB 13.9 10.9* 10.1* 9.4*  HCT 41.6 33.7* 31.5* 29.7*  PLT 260 223 176 171  MCV 95.2 95.7 96.6 97.4  MCH 31.8 31.0 31.0 30.8  MCHC 33.4 32.3 32.1 31.6  RDW 16.4* 16.2* 16.0* 15.9*  LYMPHSABS 3.0  --   --   --   MONOABS 0.8  --   --   --   EOSABS 0.1  --   --   --   BASOSABS 0.0  --   --   --     Chemistries   Recent Labs Lab 11/18/13 0740 11/20/13 0740 11/21/13 0650 11/22/13 0500 11/23/13 0446  NA 135* 143 142 143 139  K 3.9 3.8 3.8 4.1 4.2  CL 102 107 106 107 105  CO2 19 24 24 23 23   GLUCOSE 77 78 65* 75 64*  BUN 6 <3* <3* <3* <3*  CREATININE 0.60 0.54 0.50 0.50 0.60  CALCIUM 8.6 8.3* 8.2* 8.8 8.6  MG  --   --  1.5  --   --     CBG: No results found for this basename: GLUCAP,  in the last 168 hours  GFR Estimated Creatinine Clearance: 103.5 ml/min (by C-G formula based on Cr of 0.6).  Coagulation profile No results found for this basename: INR, PROTIME,  in the last 168 hours  Cardiac Enzymes No results found for this basename: CK, CKMB, TROPONINI, MYOGLOBIN,  in the last 168 hours  No components found with this basename: POCBNP,  No results found for this basename: DDIMER,  in the last 72 hours No results found for this basename: HGBA1C,  in the last 72 hours  Recent Labs  11/21/13 0650  CHOL 128  HDL 57  LDLCALC 61  TRIG 52  CHOLHDL 2.2   No results found for this basename: TSH, T4TOTAL, FREET3, T3FREE, THYROIDAB,  in the last 72 hours No results found for this basename: VITAMINB12, FOLATE, FERRITIN, TIBC, IRON, RETICCTPCT,  in the last 72 hours  Recent Labs  11/22/13 0500 11/23/13 0446  LIPASE 162* 117*    Urine  Studies No results found for this basename: UACOL, UAPR, USPG, UPH, UTP, UGL, UKET, UBIL, UHGB, UNIT, UROB, ULEU, UEPI, UWBC, URBC, UBAC, CAST, CRYS, UCOM, BILUA,  in the last 72 hours  MICROBIOLOGY: No results found for this or any previous visit (from the past 240 hour(s)).  RADIOLOGY STUDIES/RESULTS: Mr Abdomen W Wo Contrast  11/20/2013   CLINICAL DATA:  Acute pancreatitis. Indeterminate pancreatic lesion seen on recent ultrasound.  EXAM: MRI ABDOMEN WITHOUT AND WITH CONTRAST  TECHNIQUE: Multiplanar multisequence MR imaging of the abdomen was performed both before and after the administration of intravenous contrast.  CONTRAST:  47mL MULTIHANCE GADOBENATE DIMEGLUMINE 529 MG/ML IV SOLN  COMPARISON:  Abdomen ultrasound on 11/18/2013 and prior CT on 08/17/2012  FINDINGS: Moderate diffuse pancreatic edema and peripancreatic inflammatory changes are seen, consistent with acute pancreatitis. 2-3 tiny cystic foci are seen in the pancreatic body measuring up up to 11 mm, and several other tiny less than 1 cm cystic foci are seen in the pancreatic uncinate process. These most likely represent tiny pancreatic pseudocysts in the setting of pancreatitis. No solid pancreatic masses are identified. No evidence of pancreatic ductal dilatation.  No significant biliary ductal dilatation with common bile duct measuring 6 mm. Gallbladder is unremarkable. No liver masses are identified. No evidence of portal or splenic vein thrombosis. No evidence of splenomegaly. The adrenal glands and kidneys are normal in appearance. No evidence hydronephrosis Mild ascites noted in both the right and left upper quadrants.  IMPRESSION: Moderate acute pancreatitis, with mild abdominal ascites.  Several tiny cystic foci in the pancreatic uncinate process and body measuring up to 1 cm, most likely representing tiny pancreatic pseudocysts in the setting of pancreatitis. No evidence of pancreatic mass.   Electronically Signed   By: Earle Gell  M.D.   On: 11/20/2013 12:04   US Abdomen Complete  11/18/2013   CLINICAL DATA:  Abdominal pain.  Pancreatitis.  EXAM: ULTRASOUND ABDOMEN COMPLETE  COMPARISON:  Ultrasound 10/19/2013 and CT on 08/17/2012  FINDINGS: Gallbladder:  No gallstones or wall thickening visualized. No sonographic Murphy sign noted.  Common bile duct:  Diameter: 8 mm, which is mildly dilated  Liver:  No focal lesion identified. Within normal limits in parenchymal echogenicity.  IVC:  No abnormality visualized.  Pancreas:  Enlarged with heterogeneous echotexture, suspicious for acute pancreatitis. 8 mm hypoechoic nodule along the ventral surface of the pancreatic body, which could represent a complex cyst or solid mass.  Spleen:  Size and appearance within normal limits.  Right Kidney:  Length: 11.7 cm. Echogenicity within normal limits. No mass or hydronephrosis visualized.  Left Kidney:  Length: 11.0 cm. Echogenicity within normal limits. No mass or hydronephrosis visualized.  Abdominal aorta:  No aneurysm visualized.  Other findings:  None.  IMPRESSION: Enlarged and heterogeneous pancreas, suspicious for acute pancreatitis. 8 mm complex cyst versus solid mass along the ventral surface of pancreatic body. Recommend abdomen MRI without and with contrast for further characterization.  Mild biliary ductal dilatation with common bile duct measuring 8 mm. This could also be further evaluated by MRCP.   Electronically Signed   By: Earle Gell M.D.   On: 11/18/2013 08:28   Dg Abd Acute W/chest  11/17/2013   CLINICAL DATA:  Abdominal pain. Left lower quadrant pain in mid sternal chest pain.  EXAM: ACUTE ABDOMEN SERIES (ABDOMEN 2 VIEW & CHEST 1 VIEW)  COMPARISON:  Chest radiograph 10/17/2013. Abdominal radiograph 10/14/2013.  FINDINGS: There is no evidence of dilated bowel loops or free intraperitoneal air. No radiopaque calculi or significant stool burden. No abdominal mass effect. There is a focal area of sclerosis and possible central lucency  associated with the right tenth rib.  The heart. mediastinal, and hilar contours are normal. The lungs are well expanded and clear. The trachea is midline. There is mild gaseous distention of the upper thoracic esophagus.  IMPRESSION: 1. No acute cardiopulmonary disease. Nonobstructive bowel gas pattern. 2. Focal sclerosis associated with the right tenth rib. Question if this could reflect a healing rib fracture.   Electronically Signed   By: Curlene Dolphin M.D.   On:  11/17/2013 17:42    Oren Binet, MD  [facility] Pager:[phone]  If 7PM-7AM, please contact night-coverage [email or web address] Password [email or web address] 11/23/2013, 9:54 AM   LOS: 6 days   **Disclaimer: This note may have been dictated with voice recognition software. Similar sounding words can inadvertently be transcribed and this note may contain transcription errors which may not have been corrected upon publication of note.**

## 2013-11-24 LAB — LIPASE, BLOOD: Lipase: 126 U/L — ABNORMAL HIGH (ref 11–59)

## 2013-11-24 LAB — CREATININE, SERUM
CREATININE: 0.6 mg/dL (ref 0.50–1.35)
GFR calc Af Amer: >90 mL/min (ref >90)

## 2013-11-24 MED ORDER — HYDROMORPHONE HCL PF 1 MG/ML IJ SOLN
1.0000 mg | Freq: Four times a day (QID) | INTRAMUSCULAR | Status: DC | PRN
  Administered 2013-11-24 – 2013-11-25 (×5): 1 mg via INTRAVENOUS
  Filled 2013-11-24 (×5): qty 1

## 2013-11-24 MED ORDER — SENNA 8.6 MG PO TABS
2.0000 | ORAL_TABLET | Freq: Every day | ORAL | Status: DC | PRN
  Filled 2013-11-24: qty 2

## 2013-11-24 MED ORDER — OXYCODONE HCL 5 MG PO TABS
10.0000 mg | ORAL_TABLET | ORAL | Status: DC | PRN
  Administered 2013-11-24 – 2013-11-25 (×3): 10 mg via ORAL
  Administered 2013-11-25 – 2013-11-26 (×5): 15 mg via ORAL
  Filled 2013-11-24: qty 2
  Filled 2013-11-24 (×4): qty 3
  Filled 2013-11-24 (×2): qty 2
  Filled 2013-11-24: qty 3

## 2013-11-24 MED ORDER — POLYETHYLENE GLYCOL 3350 17 G PO PACK
17.0000 g | PACK | Freq: Every day | ORAL | Status: DC | PRN
  Filled 2013-11-24: qty 1

## 2013-11-24 MED ORDER — PANCRELIPASE (LIP-PROT-AMYL) 12000-38000 UNITS PO CPEP
1.0000 | ORAL_CAPSULE | Freq: Three times a day (TID) | ORAL | Status: DC
  Administered 2013-11-24 – 2013-11-26 (×7): 1 via ORAL
  Filled 2013-11-24 (×9): qty 1

## 2013-11-24 NOTE — Progress Notes (Signed)
PATIENT DETAILS Name: ALECXIS BALTZELL Age: 43 y.o. Sex: male Date of Birth: [birth year] Admit Date: 11/17/2013 Admitting Physician Merton Border, MD INO:[id number], Gabrielle Dare, MD  Subjective: Still has some abdominal pain-but better-tolerating full liquids  Assessment/Plan: Principal Problem: Acute on Chronic Pancreatitis -likely alcoholic. Claims that after he recently got discharged from [facility] had a "few"drinks (Alcohol level on 7/12-111!!) -Initially given clear liquids-but kept NPO from 7/14 given worsening pain. Unfortunately per RN patient eating "snacks", liquids when told not to. Lipase levels downtrending, claims pain is better-therefore started on clear liquids on 7/16, appears stable have advanced to full liquids-continue. Continue  IVF, supportive care. Have stopped Morphine PCA-transition to IV prn Dilaudid. Will slowly transition off IV Dilaudid to oxycodone. -Abd Ultrasound on 7/13 showed changes consistent with pancreatitis, however also showed a cystic lesion, MRI Abd on 7/15 showed moderate acute pancreatitis and small pancreatic pseudocysts measuring up to 1 cm.Will need repeat imaging at some point. -follow closely  Active Problems: Etoh Abuse -awake, alert, very minimally tremulous -on Ativan per CIWA protocol. Continue with Thiamine and folate  -counseled regarding importance of quitting and risk of life threatening pancreatitis  Suspected Alcoholic Hepatitis -AST almost 2 times ALT-given hx of ETOH intake-high suspicion for ETOH hepatitis - LFT's have normalized  Hyponatremia -resolved -suspected beer potomania  History of depression with suicide attempts in the past 2 admits to behavior health in the left one month  -c/w Seroquel,Zoloft -denies any suicidal or homicidal ideation at present  Tachycardia with history of WPW  -c/w Metoprolol-stable  Tobacco Abuse -c/w transdermal Nicotine -counseled extensively  Severe Malnutrition -in the context  of chronic illness, continue with supplements  Disposition: Remain inpatient  DVT Prophylaxis: Prophylactic Lovenox   Code Status: Full code   Family Communication None at bedside  Procedures:  None  CONSULTS:  None  MEDICATIONS: Scheduled Meds: . antiseptic oral rinse  15 mL Mouth Rinse q12n4p  . chlorhexidine  15 mL Mouth Rinse BID  . enoxaparin (LOVENOX) injection  40 mg Subcutaneous Q24H  . feeding supplement (ENSURE COMPLETE)  237 mL Oral BID BM  . folic acid  1 mg Oral Daily  . lipase/protease/amylase  1 capsule Oral TID AC  . metoprolol  50 mg Oral BID  . multivitamin with minerals  1 tablet Oral Daily  . nicotine  14 mg Transdermal Daily  . pantoprazole  40 mg Oral BID  . pneumococcal 23 valent vaccine  0.5 mL Intramuscular Tomorrow-1000  . QUEtiapine  100 mg Oral QHS  . sertraline  75 mg Oral Daily  . thiamine  100 mg Oral Daily   Continuous Infusions: . dextrose 5 % and 0.9% NaCl 100 mL/hr at 11/24/13 0819   PRN Meds:.acetaminophen, acetaminophen, albuterol, diphenhydrAMINE, diphenhydrAMINE, gi cocktail, HYDROmorphone (DILAUDID) injection, metoprolol, ondansetron (ZOFRAN) IV, ondansetron, oxyCODONE, polyethylene glycol, senna, zolpidem  Antibiotics: Anti-infectives   None       PHYSICAL EXAM: Vital signs in last 24 hours: Filed Vitals:   11/23/13 1805 11/23/13 2117 11/24/13 0505 11/24/13 0912  BP: 154/99 160/97 146/97 142/92  Pulse: 88 80 92 92  Temp: 98.3 F (36.8 C) 99.1 F (37.3 C) 98.4 F (36.9 C)   TempSrc: Oral Oral Oral   Resp: 16 16 16    Height:      Weight:      SpO2: 100% 100% 99%     Weight change:  Filed Weights   11/17/13 1443  Weight: 62.143 kg (137 lb)  Body mass index is 20.22 kg/(m^2).   Gen Exam: Awake and alert with clear speech.   Neck: Supple, No JVD.   Chest: B/L Clear.  No rales CVS: S1 S2 Regular, no murmurs.  Abdomen: soft, BS +, still with some mild tenderness in the epigastric area, non distended.    Extremities: no edema, lower extremities warm to touch. Neurologic: Non Focal.   Skin: No Rash.   Wounds: N/A.    Intake/Output from previous day:  Intake/Output Summary (Last 24 hours) at 11/24/13 1014 Last data filed at 11/24/13 0901  Gross per 24 hour  Intake   1142 ml  Output      0 ml  Net   1142 ml     LAB RESULTS: CBC  Recent Labs Lab 11/17/13 1450 11/18/13 0740 11/19/13 0625 11/21/13 0650  WBC 9.0 7.9 10.0 6.9  HGB 13.9 10.9* 10.1* 9.4*  HCT 41.6 33.7* 31.5* 29.7*  PLT 260 223 176 171  MCV 95.2 95.7 96.6 97.4  MCH 31.8 31.0 31.0 30.8  MCHC 33.4 32.3 32.1 31.6  RDW 16.4* 16.2* 16.0* 15.9*  LYMPHSABS 3.0  --   --   --   MONOABS 0.8  --   --   --   EOSABS 0.1  --   --   --   BASOSABS 0.0  --   --   --     Chemistries   Recent Labs Lab 11/18/13 0740 11/20/13 0740 11/21/13 0650 11/22/13 0500 11/23/13 0446 11/24/13 0359  NA 135* 143 142 143 139  --   K 3.9 3.8 3.8 4.1 4.2  --   CL 102 107 106 107 105  --   CO2 19 24 24 23 23   --   GLUCOSE 77 78 65* 75 64*  --   BUN 6 <3* <3* <3* <3*  --   CREATININE 0.60 0.54 0.50 0.50 0.60 0.60  CALCIUM 8.6 8.3* 8.2* 8.8 8.6  --   MG  --   --  1.5  --   --   --     CBG: No results found for this basename: GLUCAP,  in the last 168 hours  GFR Estimated Creatinine Clearance: 103.5 ml/min (by C-G formula based on Cr of 0.6).  Coagulation profile No results found for this basename: INR, PROTIME,  in the last 168 hours  Cardiac Enzymes No results found for this basename: CK, CKMB, TROPONINI, MYOGLOBIN,  in the last 168 hours  No components found with this basename: POCBNP,  No results found for this basename: DDIMER,  in the last 72 hours No results found for this basename: HGBA1C,  in the last 72 hours No results found for this basename: CHOL, HDL, LDLCALC, TRIG, CHOLHDL, LDLDIRECT,  in the last 72 hours No results found for this basename: TSH, T4TOTAL, FREET3, T3FREE, THYROIDAB,  in the last 72 hours No  results found for this basename: VITAMINB12, FOLATE, FERRITIN, TIBC, IRON, RETICCTPCT,  in the last 72 hours  Recent Labs  11/23/13 0446 11/24/13 0359  LIPASE 117* 126*    Urine Studies No results found for this basename: UACOL, UAPR, USPG, UPH, UTP, UGL, UKET, UBIL, UHGB, UNIT, UROB, ULEU, UEPI, UWBC, URBC, UBAC, CAST, CRYS, UCOM, BILUA,  in the last 72 hours  MICROBIOLOGY: Recent Results (from the past 240 hour(s))  CLOSTRIDIUM DIFFICILE BY PCR     Status: None   Collection Time    11/22/13  6:50 PM      Result Value Ref  Range Status   C difficile by pcr NEGATIVE  NEGATIVE Final    RADIOLOGY STUDIES/RESULTS: Mr Abdomen W Wo Contrast  11/20/2013   CLINICAL DATA:  Acute pancreatitis. Indeterminate pancreatic lesion seen on recent ultrasound.  EXAM: MRI ABDOMEN WITHOUT AND WITH CONTRAST  TECHNIQUE: Multiplanar multisequence MR imaging of the abdomen was performed both before and after the administration of intravenous contrast.  CONTRAST:  46mL MULTIHANCE GADOBENATE DIMEGLUMINE 529 MG/ML IV SOLN  COMPARISON:  Abdomen ultrasound on 11/18/2013 and prior CT on 08/17/2012  FINDINGS: Moderate diffuse pancreatic edema and peripancreatic inflammatory changes are seen, consistent with acute pancreatitis. 2-3 tiny cystic foci are seen in the pancreatic body measuring up up to 11 mm, and several other tiny less than 1 cm cystic foci are seen in the pancreatic uncinate process. These most likely represent tiny pancreatic pseudocysts in the setting of pancreatitis. No solid pancreatic masses are identified. No evidence of pancreatic ductal dilatation.  No significant biliary ductal dilatation with common bile duct measuring 6 mm. Gallbladder is unremarkable. No liver masses are identified. No evidence of portal or splenic vein thrombosis. No evidence of splenomegaly. The adrenal glands and kidneys are normal in appearance. No evidence hydronephrosis Mild ascites noted in both the right and left upper  quadrants.  IMPRESSION: Moderate acute pancreatitis, with mild abdominal ascites.  Several tiny cystic foci in the pancreatic uncinate process and body measuring up to 1 cm, most likely representing tiny pancreatic pseudocysts in the setting of pancreatitis. No evidence of pancreatic mass.   Electronically Signed   By: Earle Gell M.D.   On: 11/20/2013 12:04   US Abdomen Complete  11/18/2013   CLINICAL DATA:  Abdominal pain.  Pancreatitis.  EXAM: ULTRASOUND ABDOMEN COMPLETE  COMPARISON:  Ultrasound 10/19/2013 and CT on 08/17/2012  FINDINGS: Gallbladder:  No gallstones or wall thickening visualized. No sonographic Murphy sign noted.  Common bile duct:  Diameter: 8 mm, which is mildly dilated  Liver:  No focal lesion identified. Within normal limits in parenchymal echogenicity.  IVC:  No abnormality visualized.  Pancreas:  Enlarged with heterogeneous echotexture, suspicious for acute pancreatitis. 8 mm hypoechoic nodule along the ventral surface of the pancreatic body, which could represent a complex cyst or solid mass.  Spleen:  Size and appearance within normal limits.  Right Kidney:  Length: 11.7 cm. Echogenicity within normal limits. No mass or hydronephrosis visualized.  Left Kidney:  Length: 11.0 cm. Echogenicity within normal limits. No mass or hydronephrosis visualized.  Abdominal aorta:  No aneurysm visualized.  Other findings:  None.  IMPRESSION: Enlarged and heterogeneous pancreas, suspicious for acute pancreatitis. 8 mm complex cyst versus solid mass along the ventral surface of pancreatic body. Recommend abdomen MRI without and with contrast for further characterization.  Mild biliary ductal dilatation with common bile duct measuring 8 mm. This could also be further evaluated by MRCP.   Electronically Signed   By: Earle Gell M.D.   On: 11/18/2013 08:28   Dg Abd Acute W/chest  11/17/2013   CLINICAL DATA:  Abdominal pain. Left lower quadrant pain in mid sternal chest pain.  EXAM: ACUTE ABDOMEN SERIES  (ABDOMEN 2 VIEW & CHEST 1 VIEW)  COMPARISON:  Chest radiograph 10/17/2013. Abdominal radiograph 10/14/2013.  FINDINGS: There is no evidence of dilated bowel loops or free intraperitoneal air. No radiopaque calculi or significant stool burden. No abdominal mass effect. There is a focal area of sclerosis and possible central lucency associated with the right tenth rib.  The heart. mediastinal,  and hilar contours are normal. The lungs are well expanded and clear. The trachea is midline. There is mild gaseous distention of the upper thoracic esophagus.  IMPRESSION: 1. No acute cardiopulmonary disease. Nonobstructive bowel gas pattern. 2. Focal sclerosis associated with the right tenth rib. Question if this could reflect a healing rib fracture.   Electronically Signed   By: Curlene Dolphin M.D.   On: 11/17/2013 17:42    Oren Binet, MD  [facility] Pager:[phone]  If 7PM-7AM, please contact night-coverage [email or web address] Password [email or web address] 11/24/2013, 10:14 AM   LOS: 7 days   **Disclaimer: This note may have been dictated with voice recognition software. Similar sounding words can inadvertently be transcribed and this note may contain transcription errors which may not have been corrected upon publication of note.**

## 2013-11-25 DIAGNOSIS — I456 Pre-excitation syndrome: Secondary | ICD-10-CM

## 2013-11-25 DIAGNOSIS — K862 Cyst of pancreas: Secondary | ICD-10-CM

## 2013-11-25 DIAGNOSIS — K863 Pseudocyst of pancreas: Secondary | ICD-10-CM

## 2013-11-25 NOTE — Consult Note (Addendum)
[facility] Consult: 1:09 PM 11/25/2013  LOS: 8 days    Referring Provider: Nena Alexander Primary Care Physician:  Angelica Chessman, MD Primary Gastroenterologist:  None/Unassigned    Reason for Consultation:  Pancreatitis / ABD pain with eating   HPI: [name] is a 43 y.o. male with a history of pancreatitis likely related to alcohol consumption. He was recently released from [facility] for substance abuse and depression with suicide attempts. He reports consuming alcohol after his discharge. He presented to [facility] on 11/17/13 with a 2 day hx of LUQ pain with N/V/D consistent with his pancreatitis flares in the past, with his previous admission for pancreatitis being approx 1.5 years ago by his estimate. His lipase has been trending down this visit and was most recently at 126. His transaminases have trended down and are currently WNL. Ultrasound on 11/18/13 showed an enlarged and heterogenous pancreas; consistent with pancreatitis as well as a pancreatic cystic lesions. MRI showed same.   Pt was scheduled for discharge today but had worsening pain and an episode of vomiting have his first attempt at solid food again (soft diet of eggs and grits).  He stated his pain was a 7/10 but has since lessened to a 4/10, nearing his baseline of 2-3/10 on days when he has pain. States his IV fluids were stopped this morning.  He denies blood in his vomit or stool, and reports a loose BM last night. He denies IV drug use and says that he knows alcohol is the causative factor in most of his problems, but does not explicitly express a desire to quit.     Past Medical History  Diagnosis Date  . Hypertension   . Asthma   . Pancreatitis   . Cocaine abuse   . Depression   . H/O suicide attempt   . Heart murmur     "when he was  little" (03/06/2013)  . Shortness of breath     "can happen at anytime" (03/06/2013)  . Anemia   . H/O hiatal hernia   . GERD (gastroesophageal reflux disease)   . Arthritis     "knees" (03/06/2013)  . Anxiety   . WPW (Wolff-Parkinson-White syndrome)     [name] 03/06/2013    Past Surgical History  Procedure Laterality Date  . Facial fracture surgery Left 1990's    "result of trauma" (03/06/2013)  . Eye surgery Left 1990's    "result of trauma" (03/06/2013)    Prior to Admission medications   Medication Sig Start Date End Date Taking? Authorizing Provider  acetaminophen (TYLENOL) 325 MG tablet Take 650 mg by mouth 2 (two) times daily as needed (pain).   Yes Historical Provider, MD  albuterol (PROVENTIL HFA;VENTOLIN HFA) 108 (90 BASE) MCG/ACT inhaler Inhale 1-2 puffs into the lungs every 6 (six) hours as needed for wheezing or shortness of breath. 11/08/13  Yes Encarnacion Slates, NP  lansoprazole (PREVACID) 15 MG capsule Take 1 capsule (15 mg total) by mouth 2 (two) times daily as needed (for reflux). 11/08/13  Yes Encarnacion Slates, NP  LORazepam (ATIVAN) 1 MG tablet Take 1 tablet (1 mg total) by mouth every 8 (eight) hours as needed for anxiety. 11/08/13  Yes Encarnacion Slates, NP  metoprolol (LOPRESSOR) 50 MG tablet Take 1 tablet (50 mg total) by mouth 2 (two) times daily. For high blood pressure 11/08/13  Yes Encarnacion Slates, NP  Multiple Vitamin (MULTIVITAMIN WITH MINERALS) TABS tablet Take 1 tablet by mouth daily. For low vitamin 11/08/13  Yes Encarnacion Slates, NP  nicotine (NICODERM CQ - DOSED IN MG/24 HOURS) 14 mg/24hr patch Place 1 patch (14 mg total) onto the skin daily. For nicotine dependence 11/08/13  Yes Encarnacion Slates, NP  oxyCODONE-acetaminophen (PERCOCET/ROXICET) 5-325 MG per tablet Take 2 tablets by mouth every 6 (six) hours as needed for moderate pain or severe pain. 11/08/13  Yes Encarnacion Slates, NP  QUEtiapine (SEROQUEL) 100 MG tablet Take 1 tablet (100 mg total) by mouth at bedtime. For mood control  11/08/13  Yes Encarnacion Slates, NP  QUEtiapine (SEROQUEL) 25 MG tablet Take 25 mg by mouth 2 (two) times daily. Take 1 tablet (25 mg) every morning and 1 tablet at 3pm   Yes Historical Provider, MD  sertraline (ZOLOFT) 25 MG tablet Take 3 tablets (75 mg total) by mouth daily. For depression 11/09/13  Yes Encarnacion Slates, NP    Scheduled Meds: . enoxaparin (LOVENOX) injection  40 mg Subcutaneous Q24H  . feeding supplement (ENSURE COMPLETE)  237 mL Oral BID BM  . folic acid  1 mg Oral Daily  . lipase/protease/amylase  1 capsule Oral TID AC  . metoprolol  50 mg Oral BID  . multivitamin with minerals  1 tablet Oral Daily  . nicotine  14 mg Transdermal Daily  . pantoprazole  40 mg Oral BID  . pneumococcal 23 valent vaccine  0.5 mL Intramuscular Tomorrow-1000  . QUEtiapine  100 mg Oral QHS  . sertraline  75 mg Oral Daily  . thiamine  100 mg Oral Daily   Infusions:   PRN Meds: acetaminophen, acetaminophen, albuterol, diphenhydrAMINE, diphenhydrAMINE, gi cocktail, HYDROmorphone (DILAUDID) injection, metoprolol, ondansetron (ZOFRAN) IV, ondansetron, oxyCODONE, polyethylene glycol, senna, zolpidem   Allergies as of 11/17/2013 - Review Complete 11/17/2013  Allergen Reaction Noted  . Trazodone and nefazodone Other (See Comments) 09/28/2011  . Shellfish-derived products Nausea And Vomiting 08/18/2010    Family History  Problem Relation Age of Onset  . Hypertension Other   . Coronary artery disease Other     History   Social History  . Marital Status: Single    Spouse Name: N/A    Number of Children: N/A  . Years of Education: N/A   Occupational History  . Not on file.   Social History Main Topics  . Smoking status: Current Every Day Smoker -- 1.50 packs/day for 30 years    Types: Cigarettes  . Smokeless tobacco: Current User  . Alcohol Use: Yes     Comment: 12 pk beer daily 12oz cans liquor occasionally  . Drug Use: Yes    Special: Cocaine, Marijuana     Comment: 03/06/2013 "Cocaine  2012, Marijuana " 10/05/13 smokes marijuana  . Sexual Activity: Yes   Other Topics Concern  . Not on file   Social History Narrative  . No narrative on file    REVIEW OF SYSTEMS: Constitutional:  No weight loss or fatigue ENT:  No nose bleeds Pulm:  Denies COB. CV:  No palpitations, no LE edema.  GU:  No hematuria, no frequency GI:  Denies hematemesis, hematochezia, or melena. Neuro:  No headaches, no peripheral tingling or numbness Derm:  No itching, no rash or sores.  Endocrine:  No sweats or chills.  No polyuria or dysuria Immunization:  Up to date.  Declines flu shot. Travel:  None beyond local counties in last few months.    PHYSICAL EXAM: Vital signs in last 24 hours: Filed Vitals:   11/25/13 0902  BP: 130/80  Pulse: 80  Temp:   Resp:    Wt Readings from Last 3 Encounters:  11/17/13 137 lb (62.143 kg)  10/22/13 126 lb (57.153 kg)  10/06/13 130 lb 8.2 oz (59.2 kg)    General: AA male in NAD, seated, eating lunch HENT: Normocephalic, atraumatic.  Without mass or lesion.  Oral mucosa pink and moist. Lungs:  CTA Bilaterally, Unlabored. Heart: RRR, No M/G/R appreciated Abdomen:  Soft non distended. Mild tenderness diffusely > on LUQ. Normal BS   Musc/Skeltl: No gross abnormalities Extremities:  No peripheral edema, 2+ DP and radial pulses Neurologic:  A&O to person, place and time. Skin:  No rashes or lesions noted Psych:  Normal mood and affect. Cooperative  Intake/Output from previous day: 07/19 0701 - 07/20 0700 In: 2432 [P.O.:682; I.V.:1750] Out: -  Intake/Output this shift: Total I/O In: 444 [P.O.:444] Out: -   LAB RESULTS: No results found for this basename: WBC, HGB, HCT, PLT,  in the last 72 hours BMET Lab Results  Component Value Date   NA 139 11/23/2013   NA 143 11/22/2013   NA 142 11/21/2013   K 4.2 11/23/2013   K 4.1 11/22/2013   K 3.8 11/21/2013   CL 105 11/23/2013   CL 107 11/22/2013   CL 106 11/21/2013   CO2 23 11/23/2013   CO2 23  11/22/2013   CO2 24 11/21/2013   GLUCOSE 64* 11/23/2013   GLUCOSE 75 11/22/2013   GLUCOSE 65* 11/21/2013   BUN <3* 11/23/2013   BUN <3* 11/22/2013   BUN <3* 11/21/2013   CREATININE 0.60 11/24/2013   CREATININE 0.60 11/23/2013   CREATININE 0.50 11/22/2013   CALCIUM 8.6 11/23/2013   CALCIUM 8.8 11/22/2013   CALCIUM 8.2* 11/21/2013   LFT  Recent Labs  11/23/13 0446  PROT 6.1  ALBUMIN 2.5*  AST 33  ALT 30  ALKPHOS 107  BILITOT 0.3   PT/INR Lab Results  Component Value Date   INR 1.21 10/08/2013   INR 1.70* 10/07/2013   INR 1.53* 10/06/2013   Hepatitis Panel No results found for this basename: HEPBSAG, HCVAB, HEPAIGM, HEPBIGM,  in the last 72 hours C-Diff No components found with this basename: cdiff   Lipase     Component Value Date/Time   LIPASE 126* 11/24/2013 0359    Drugs of Abuse     Component Value Date/Time   LABOPIA NONE DETECTED 11/17/2013 1738   LABOPIA Negative 11/08/2012 1226   LABOPIA  Value: POSITIVE (NOTE) Result repeated and verified. Sent for confirmatory testing* 05/05/2009 1507   COCAINSCRNUR NONE DETECTED 11/17/2013 1738   COCAINSCRNUR NEGATIVE 05/05/2009 1507   LABBENZ NONE DETECTED 11/17/2013 1738   LABBENZ Negative 11/08/2012 1226   LABBENZ NEGATIVE 05/05/2009 1507   AMPHETMU NONE DETECTED 11/17/2013 1738   AMPHETMU NEGATIVE 05/05/2009 1507   THCU NONE DETECTED 11/17/2013 1738   LABBARB NONE DETECTED 11/17/2013 1738   LABBARB Negative 11/08/2012 1226     RADIOLOGY STUDIES: No results found.  ENDOSCOPIC STUDIES: Denies hx of colonoscopy or Endoscopy.  IMPRESSION:   - Pancreatitis:  Likely  alcoholic in origin.  Being managed with narcotic pain meds and IVF (?).  Was scheduled for d/c today but not tolerating diet well. Decreased from soft diet to full liquids. - Etoh Abuse - CIWA protocol    PLAN:     - Seems to be nearing his chronic pancreatitis baseline.  Appears to be past the acute episode with lipase levels down and symptoms intermittent. Will  discuss further with MD, but likely fine for d/c from GI standpoint.   Lollie Marrow  11/25/2013, 1:09 PM Pager: [phone]       Attending physician's note   I have taken a history, examined the patient and reviewed the chart. I agree with the PA students note, impression and recommendations. Pancreatitis, likely secondary to etoh, which is steadily improving. MRI showing pancreatic edema and peripancreatic inflammatory changes, small pancreatic cysts measuring up to 1 cm in the uncinate process which are likely small pseudocysts.   Continue a low fat, full liquid diet for now and advance gradually as he tolerates to a low fat, soft diet over the next few days either as inpatient or outpatient. Decrease pain medications as tolerated. Avoid any etoh intake at all. Etoh rehab as outpatient.  PPI daily for now. Antiemetics as needed. GI signing off.   Ladene Artist, MD [facility]

## 2013-11-25 NOTE — Progress Notes (Signed)
PATIENT DETAILS Name: [name] Age: 43 y.o. Sex: male Date of Birth: 07-Sep-1970 Admit Date: 11/17/2013 Admitting Physician Merton Border, MD [id number], Gabrielle Dare, MD  Subjective: Still has some abdominal pain-but better-tolerating full liquids  Assessment/Plan: Principal Problem: Acute on Chronic Pancreatitis -likely alcoholic. Claims that after he recently got discharged from [facility] had a "few"drinks (Alcohol level on 7/12-111!!) -Initially given clear liquids-but kept NPO from 7/14 given worsening pain. Lipase levels downtrending, slow improvement, diet finally advanced to soft/low fat diet, but claims he had worsening pain/vomoting, therefore will downgrade back to full liquids.Continue supportive care with Narcotics, IVF. I will consult GI to see if they can be of assistance. -Abd Ultrasound on 7/13 showed changes consistent with pancreatitis, however also showed a cystic lesion, MRI Abd on 7/15 showed moderate acute pancreatitis and small pancreatic pseudocysts measuring up to 1 cm.Will need repeat imaging at some point. -follow closely  Active Problems: Etoh Abuse -awake, alert, very minimally tremulous -on Ativan per CIWA protocol. Continue with Thiamine and folate  -counseled regarding importance of quitting and risk of life threatening pancreatitis  Suspected Alcoholic Hepatitis -AST almost 2 times ALT-given hx of ETOH intake-high suspicion for ETOH hepatitis - LFT's have normalized  Hyponatremia -resolved -suspected beer potomania  History of depression with suicide attempts in the past 2 admits to behavior health in the left one month  -c/w Seroquel,Zoloft -denies any suicidal or homicidal ideation at present  Tachycardia with history of WPW  -c/w Metoprolol-stable  Tobacco Abuse -c/w transdermal Nicotine -counseled extensively  Severe Malnutrition -in the context of chronic illness, continue with supplements  Disposition: Remain  inpatient  DVT Prophylaxis: Prophylactic Lovenox   Code Status: Full code   Family Communication None at bedside  Procedures:  None  CONSULTS:  None  MEDICATIONS: Scheduled Meds: . enoxaparin (LOVENOX) injection  40 mg Subcutaneous Q24H  . feeding supplement (ENSURE COMPLETE)  237 mL Oral BID BM  . folic acid  1 mg Oral Daily  . lipase/protease/amylase  1 capsule Oral TID AC  . metoprolol  50 mg Oral BID  . multivitamin with minerals  1 tablet Oral Daily  . nicotine  14 mg Transdermal Daily  . pantoprazole  40 mg Oral BID  . pneumococcal 23 valent vaccine  0.5 mL Intramuscular Tomorrow-1000  . QUEtiapine  100 mg Oral QHS  . sertraline  75 mg Oral Daily  . thiamine  100 mg Oral Daily   Continuous Infusions:   PRN Meds:.acetaminophen, acetaminophen, albuterol, diphenhydrAMINE, diphenhydrAMINE, gi cocktail, HYDROmorphone (DILAUDID) injection, metoprolol, ondansetron (ZOFRAN) IV, ondansetron, oxyCODONE, polyethylene glycol, senna, zolpidem  Antibiotics: Anti-infectives   None       PHYSICAL EXAM: Vital signs in last 24 hours: Filed Vitals:   11/24/13 2126 11/24/13 2358 11/25/13 0333 11/25/13 0902  BP: 165/95 137/88 151/87 130/80  Pulse: 86 88 81 80  Temp: 98.3 F (36.8 C)  97.8 F (36.6 C)   TempSrc: Oral  Oral   Resp: 17  18   Height:      Weight:      SpO2: 100%  99%     Weight change:  Filed Weights   11/17/13 1443  Weight: 62.143 kg (137 lb)   Body mass index is 20.22 kg/(m^2).   Gen Exam: Awake and alert with clear speech.   Neck: Supple, No JVD.   Chest: B/L Clear.  No rales CVS: S1 S2 Regular, no murmurs.  Abdomen: soft, BS +, still with  some mild to moderate tenderness in the epigastric area, non distended.  Extremities: no edema, lower extremities warm to touch. Neurologic: Non Focal.   Skin: No Rash.   Wounds: N/A.    Intake/Output from previous day:  Intake/Output Summary (Last 24 hours) at 11/25/13 1242 Last data filed at  11/25/13 0906  Gross per 24 hour  Intake   2454 ml  Output      0 ml  Net   2454 ml     LAB RESULTS: CBC  Recent Labs Lab 11/19/13 0625 11/21/13 0650  WBC 10.0 6.9  HGB 10.1* 9.4*  HCT 31.5* 29.7*  PLT 176 171  MCV 96.6 97.4  MCH 31.0 30.8  MCHC 32.1 31.6  RDW 16.0* 15.9*    Chemistries   Recent Labs Lab 11/20/13 0740 11/21/13 0650 11/22/13 0500 11/23/13 0446 11/24/13 0359  NA 143 142 143 139  --   K 3.8 3.8 4.1 4.2  --   CL 107 106 107 105  --   CO2 24 24 23 23   --   GLUCOSE 78 65* 75 64*  --   BUN <3* <3* <3* <3*  --   CREATININE 0.54 0.50 0.50 0.60 0.60  CALCIUM 8.3* 8.2* 8.8 8.6  --   MG  --  1.5  --   --   --     CBG: No results found for this basename: GLUCAP,  in the last 168 hours  GFR Estimated Creatinine Clearance: 103.5 ml/min (by C-G formula based on Cr of 0.6).  Coagulation profile No results found for this basename: INR, PROTIME,  in the last 168 hours  Cardiac Enzymes No results found for this basename: CK, CKMB, TROPONINI, MYOGLOBIN,  in the last 168 hours  No components found with this basename: POCBNP,  No results found for this basename: DDIMER,  in the last 72 hours No results found for this basename: HGBA1C,  in the last 72 hours No results found for this basename: CHOL, HDL, LDLCALC, TRIG, CHOLHDL, LDLDIRECT,  in the last 72 hours No results found for this basename: TSH, T4TOTAL, FREET3, T3FREE, THYROIDAB,  in the last 72 hours No results found for this basename: VITAMINB12, FOLATE, FERRITIN, TIBC, IRON, RETICCTPCT,  in the last 72 hours  Recent Labs  11/23/13 0446 11/24/13 0359  LIPASE 117* 126*    Urine Studies No results found for this basename: UACOL, UAPR, USPG, UPH, UTP, UGL, UKET, UBIL, UHGB, UNIT, UROB, ULEU, UEPI, UWBC, URBC, UBAC, CAST, CRYS, UCOM, BILUA,  in the last 72 hours  MICROBIOLOGY: Recent Results (from the past 240 hour(s))  CLOSTRIDIUM DIFFICILE BY PCR     Status: None   Collection Time     11/22/13  6:50 PM      Result Value Ref Range Status   C difficile by pcr NEGATIVE  NEGATIVE Final    RADIOLOGY STUDIES/RESULTS: Mr Abdomen W Wo Contrast  11/20/2013   CLINICAL DATA:  Acute pancreatitis. Indeterminate pancreatic lesion seen on recent ultrasound.  EXAM: MRI ABDOMEN WITHOUT AND WITH CONTRAST  TECHNIQUE: Multiplanar multisequence MR imaging of the abdomen was performed both before and after the administration of intravenous contrast.  CONTRAST:  79mL MULTIHANCE GADOBENATE DIMEGLUMINE 529 MG/ML IV SOLN  COMPARISON:  Abdomen ultrasound on 11/18/2013 and prior CT on 08/17/2012  FINDINGS: Moderate diffuse pancreatic edema and peripancreatic inflammatory changes are seen, consistent with acute pancreatitis. 2-3 tiny cystic foci are seen in the pancreatic body measuring up up to 11 mm, and several  other tiny less than 1 cm cystic foci are seen in the pancreatic uncinate process. These most likely represent tiny pancreatic pseudocysts in the setting of pancreatitis. No solid pancreatic masses are identified. No evidence of pancreatic ductal dilatation.  No significant biliary ductal dilatation with common bile duct measuring 6 mm. Gallbladder is unremarkable. No liver masses are identified. No evidence of portal or splenic vein thrombosis. No evidence of splenomegaly. The adrenal glands and kidneys are normal in appearance. No evidence hydronephrosis Mild ascites noted in both the right and left upper quadrants.  IMPRESSION: Moderate acute pancreatitis, with mild abdominal ascites.  Several tiny cystic foci in the pancreatic uncinate process and body measuring up to 1 cm, most likely representing tiny pancreatic pseudocysts in the setting of pancreatitis. No evidence of pancreatic mass.   Electronically Signed   By: Earle Gell M.D.   On: 11/20/2013 12:04   US Abdomen Complete  11/18/2013   CLINICAL DATA:  Abdominal pain.  Pancreatitis.  EXAM: ULTRASOUND ABDOMEN COMPLETE  COMPARISON:  Ultrasound  10/19/2013 and CT on 08/17/2012  FINDINGS: Gallbladder:  No gallstones or wall thickening visualized. No sonographic Murphy sign noted.  Common bile duct:  Diameter: 8 mm, which is mildly dilated  Liver:  No focal lesion identified. Within normal limits in parenchymal echogenicity.  IVC:  No abnormality visualized.  Pancreas:  Enlarged with heterogeneous echotexture, suspicious for acute pancreatitis. 8 mm hypoechoic nodule along the ventral surface of the pancreatic body, which could represent a complex cyst or solid mass.  Spleen:  Size and appearance within normal limits.  Right Kidney:  Length: 11.7 cm. Echogenicity within normal limits. No mass or hydronephrosis visualized.  Left Kidney:  Length: 11.0 cm. Echogenicity within normal limits. No mass or hydronephrosis visualized.  Abdominal aorta:  No aneurysm visualized.  Other findings:  None.  IMPRESSION: Enlarged and heterogeneous pancreas, suspicious for acute pancreatitis. 8 mm complex cyst versus solid mass along the ventral surface of pancreatic body. Recommend abdomen MRI without and with contrast for further characterization.  Mild biliary ductal dilatation with common bile duct measuring 8 mm. This could also be further evaluated by MRCP.   Electronically Signed   By: Earle Gell M.D.   On: 11/18/2013 08:28   Dg Abd Acute W/chest  11/17/2013   CLINICAL DATA:  Abdominal pain. Left lower quadrant pain in mid sternal chest pain.  EXAM: ACUTE ABDOMEN SERIES (ABDOMEN 2 VIEW & CHEST 1 VIEW)  COMPARISON:  Chest radiograph 10/17/2013. Abdominal radiograph 10/14/2013.  FINDINGS: There is no evidence of dilated bowel loops or free intraperitoneal air. No radiopaque calculi or significant stool burden. No abdominal mass effect. There is a focal area of sclerosis and possible central lucency associated with the right tenth rib.  The heart. mediastinal, and hilar contours are normal. The lungs are well expanded and clear. The trachea is midline. There is mild  gaseous distention of the upper thoracic esophagus.  IMPRESSION: 1. No acute cardiopulmonary disease. Nonobstructive bowel gas pattern. 2. Focal sclerosis associated with the right tenth rib. Question if this could reflect a healing rib fracture.   Electronically Signed   By: Curlene Dolphin M.D.   On: 11/17/2013 17:42    Oren Binet, MD  [facility] Pager:[phone]  If 7PM-7AM, please contact night-coverage [email or web address] Password [email or web address] 11/25/2013, 12:42 PM   LOS: 8 days   **Disclaimer: This note may have been dictated with voice recognition software. Similar sounding words can inadvertently be transcribed and this note may contain  transcription errors which may not have been corrected upon publication of note.**

## 2013-11-26 LAB — COMPREHENSIVE METABOLIC PANEL
ALT: 34 U/L (ref 0–53)
ANION GAP: 9 (ref 5–15)
AST: 29 U/L (ref 0–37)
Albumin: 2.6 g/dL — ABNORMAL LOW (ref 3.5–5.2)
Alkaline Phosphatase: 90 U/L (ref 39–117)
BILIRUBIN TOTAL: 0.3 mg/dL (ref 0.3–1.2)
CALCIUM: 8.7 mg/dL (ref 8.4–10.5)
CHLORIDE: 104 meq/L (ref 96–112)
CO2: 27 meq/L (ref 19–32)
Creatinine, Ser: 0.58 mg/dL (ref 0.50–1.35)
GLUCOSE: 80 mg/dL (ref 70–99)
Potassium: 3.7 mEq/L (ref 3.7–5.3)
Sodium: 140 mEq/L (ref 137–147)
Total Protein: 6.1 g/dL (ref 6.0–8.3)

## 2013-11-26 LAB — LIPASE, BLOOD: Lipase: 131 U/L — ABNORMAL HIGH (ref 11–59)

## 2013-11-26 MED ORDER — ALBUTEROL SULFATE HFA 108 (90 BASE) MCG/ACT IN AERS: 1.0000 | INHALATION_SPRAY | Freq: Four times a day (QID) | RESPIRATORY_TRACT | Status: DC | PRN

## 2013-11-26 MED ORDER — PANCRELIPASE (LIP-PROT-AMYL) 12000-38000 UNITS PO CPEP: 1.0000 | ORAL_CAPSULE | Freq: Three times a day (TID) | ORAL | Status: DC

## 2013-11-26 MED ORDER — OXYCODONE HCL 10 MG PO TABS: 10.0000 mg | ORAL_TABLET | ORAL | Status: DC | PRN

## 2013-11-26 MED ORDER — FOLIC ACID 1 MG PO TABS: 1.0000 mg | ORAL_TABLET | Freq: Every day | ORAL | Status: DC

## 2013-11-26 MED ORDER — THIAMINE HCL 100 MG PO TABS: 100.0000 mg | ORAL_TABLET | Freq: Every day | ORAL | Status: DC

## 2013-11-26 MED ORDER — SENNA 8.6 MG PO TABS: 2.0000 | ORAL_TABLET | Freq: Every day | ORAL | Status: DC | PRN

## 2013-11-26 MED ORDER — PANTOPRAZOLE SODIUM 40 MG PO TBEC: 40.0000 mg | DELAYED_RELEASE_TABLET | Freq: Two times a day (BID) | ORAL | Status: DC

## 2013-11-26 MED ORDER — GI COCKTAIL ~~LOC~~: 30.0000 mL | Freq: Three times a day (TID) | ORAL | Status: DC | PRN

## 2013-11-26 MED ORDER — NICOTINE 14 MG/24HR TD PT24: 14.0000 mg | MEDICATED_PATCH | Freq: Every day | TRANSDERMAL | Status: DC

## 2013-11-26 MED ORDER — METOPROLOL TARTRATE 50 MG PO TABS: 50.0000 mg | ORAL_TABLET | Freq: Two times a day (BID) | ORAL | Status: DC

## 2013-11-26 MED ORDER — ENSURE COMPLETE PO LIQD: 237.0000 mL | Freq: Two times a day (BID) | ORAL | Status: DC

## 2013-11-26 MED ORDER — ONDANSETRON HCL 4 MG PO TABS: 4.0000 mg | ORAL_TABLET | Freq: Four times a day (QID) | ORAL | Status: DC | PRN

## 2013-11-26 NOTE — Progress Notes (Signed)
[name] to be D/C'd Home per MD order.  Discussed with the patient and all questions fully answered.    Medication List    STOP taking these medications       acetaminophen 325 MG tablet  Commonly known as:  TYLENOL     lansoprazole 15 MG capsule  Commonly known as:  PREVACID     oxyCODONE-acetaminophen 5-325 MG per tablet  Commonly known as:  PERCOCET/ROXICET      TAKE these medications       albuterol 108 (90 BASE) MCG/ACT inhaler  Commonly known as:  PROVENTIL HFA;VENTOLIN HFA  Inhale 1-2 puffs into the lungs every 6 (six) hours as needed for wheezing or shortness of breath.     feeding supplement (ENSURE COMPLETE) Liqd  Take 237 mLs by mouth 2 (two) times daily between meals.     folic acid 1 MG tablet  Commonly known as:  FOLVITE  Take 1 tablet (1 mg total) by mouth daily.     gi cocktail Susp suspension  Take 30 mLs by mouth 3 (three) times daily as needed for indigestion. Shake well.     lipase/protease/amylase 12000 UNITS Cpep capsule  Commonly known as:  CREON-12/PANCREASE  Take 1 capsule by mouth 3 (three) times daily before meals.     LORazepam 1 MG tablet  Commonly known as:  ATIVAN  Take 1 tablet (1 mg total) by mouth every 8 (eight) hours as needed for anxiety.     metoprolol 50 MG tablet  Commonly known as:  LOPRESSOR  Take 1 tablet (50 mg total) by mouth 2 (two) times daily. For high blood pressure     multivitamin with minerals Tabs tablet  Take 1 tablet by mouth daily. For low vitamin     nicotine 14 mg/24hr patch  Commonly known as:  NICODERM CQ - dosed in mg/24 hours  Place 1 patch (14 mg total) onto the skin daily. For nicotine dependence     ondansetron 4 MG tablet  Commonly known as:  ZOFRAN  Take 1 tablet (4 mg total) by mouth every 6 (six) hours as needed for nausea or vomiting.     Oxycodone HCl 10 MG Tabs  Take 1-1.5 tablets (10-15 mg total) by mouth every 4 (four) hours as needed for moderate pain.     pantoprazole 40 MG  tablet  Commonly known as:  PROTONIX  Take 1 tablet (40 mg total) by mouth 2 (two) times daily.     QUEtiapine 100 MG tablet  Commonly known as:  SEROQUEL  Take 1 tablet (100 mg total) by mouth at bedtime. For mood control     senna 8.6 MG Tabs tablet  Commonly known as:  SENOKOT  Take 2 tablets (17.2 mg total) by mouth daily as needed for mild constipation.     sertraline 25 MG tablet  Commonly known as:  ZOLOFT  Take 3 tablets (75 mg total) by mouth daily. For depression     thiamine 100 MG tablet  Take 1 tablet (100 mg total) by mouth daily.        VVS, Skin clean, dry and intact without evidence of skin break down, no evidence of skin tears noted. IV catheter discontinued intact. Site without signs and symptoms of complications. Dressing and pressure applied.  An After Visit Summary was printed and given to the patient.  D/c education completed with patient/family including follow up instructions, medication list, d/c activities limitations if indicated, with other d/c instructions as  indicated by MD - patient able to verbalize understanding, all questions fully answered.   Patient instructed to return to ED, call 911, or call MD for any changes in condition.   Patient escorted via [place], and D/C home via private auto.  Audria Nine F 11/26/2013 2:59 PM

## 2013-11-26 NOTE — Progress Notes (Signed)
Pt refusing pna vaccine at this time. Does not want to wait for it, instead just wants to be discharged.

## 2013-11-26 NOTE — Discharge Summary (Signed)
PATIENT DETAILS Name: [name] Age: 43 y.o. Sex: male Date of Birth: November 27, 1970 MRN: [id number]. Admit Date: 11/17/2013 Admitting Physician: Merton Border, MD [id number], Gabrielle Dare, MD  Recommendations for Outpatient Follow-up:  1. Stay on full liquid or soft diet/low fat for next 10 days, only advance after speaking with your primary care MD 2. Needs ongoing counselling regarding completely stopping ETOH 3. Needs outpatient psych follow up 4. Needs repeat CT Abdomen in 3-4 weeks to re-assess pancreatic pseudocysts   PRIMARY DISCHARGE DIAGNOSIS:  Principal Problem:   ABDOMINAL PAIN Active Problems:   Abdominal pain   Chronic pancreatitis   Pancreatic pseudocyst/cyst      PAST MEDICAL HISTORY: Past Medical History  Diagnosis Date  . Hypertension   . Asthma   . Pancreatitis   . Cocaine abuse   . Depression   . H/O suicide attempt   . Heart murmur     "when he was little" (03/06/2013)  . Shortness of breath     "can happen at anytime" (03/06/2013)  . Anemia   . H/O hiatal hernia   . GERD (gastroesophageal reflux disease)   . Arthritis     "knees" (03/06/2013)  . Anxiety   . WPW (Wolff-Parkinson-White syndrome)     [name] 03/06/2013    DISCHARGE MEDICATIONS:   Medication List    STOP taking these medications       acetaminophen 325 MG tablet  Commonly known as:  TYLENOL     lansoprazole 15 MG capsule  Commonly known as:  PREVACID     oxyCODONE-acetaminophen 5-325 MG per tablet  Commonly known as:  PERCOCET/ROXICET      TAKE these medications       albuterol 108 (90 BASE) MCG/ACT inhaler  Commonly known as:  PROVENTIL HFA;VENTOLIN HFA  Inhale 1-2 puffs into the lungs every 6 (six) hours as needed for wheezing or shortness of breath.     feeding supplement (ENSURE COMPLETE) Liqd  Take 237 mLs by mouth 2 (two) times daily between meals.     folic acid 1 MG tablet  Commonly known as:  FOLVITE  Take 1 tablet (1 mg total) by mouth daily.     gi  cocktail Susp suspension  Take 30 mLs by mouth 3 (three) times daily as needed for indigestion. Shake well.     lipase/protease/amylase 12000 UNITS Cpep capsule  Commonly known as:  CREON-12/PANCREASE  Take 1 capsule by mouth 3 (three) times daily before meals.     LORazepam 1 MG tablet  Commonly known as:  ATIVAN  Take 1 tablet (1 mg total) by mouth every 8 (eight) hours as needed for anxiety.     metoprolol 50 MG tablet  Commonly known as:  LOPRESSOR  Take 1 tablet (50 mg total) by mouth 2 (two) times daily. For high blood pressure     multivitamin with minerals Tabs tablet  Take 1 tablet by mouth daily. For low vitamin     nicotine 14 mg/24hr patch  Commonly known as:  NICODERM CQ - dosed in mg/24 hours  Place 1 patch (14 mg total) onto the skin daily. For nicotine dependence     ondansetron 4 MG tablet  Commonly known as:  ZOFRAN  Take 1 tablet (4 mg total) by mouth every 6 (six) hours as needed for nausea or vomiting.     Oxycodone HCl 10 MG Tabs  Take 1-1.5 tablets (10-15 mg total) by mouth every 4 (four) hours as needed for moderate pain.  pantoprazole 40 MG tablet  Commonly known as:  PROTONIX  Take 1 tablet (40 mg total) by mouth 2 (two) times daily.     QUEtiapine 100 MG tablet  Commonly known as:  SEROQUEL  Take 1 tablet (100 mg total) by mouth at bedtime. For mood control     senna 8.6 MG Tabs tablet  Commonly known as:  SENOKOT  Take 2 tablets (17.2 mg total) by mouth daily as needed for mild constipation.     sertraline 25 MG tablet  Commonly known as:  ZOLOFT  Take 3 tablets (75 mg total) by mouth daily. For depression     thiamine 100 MG tablet  Take 1 tablet (100 mg total) by mouth daily.        ALLERGIES:   Allergies  Allergen Reactions  . Trazodone And Nefazodone Other (See Comments)    Muscle spasms  . Shellfish-Derived Products Nausea And Vomiting    BRIEF HPI:  See H&P, Labs, Consult and Test reports for all details in brief, a 43  y.o. male, with history of chronic pancreatitis, substance abuse and depression with suicidal attempts in the past for which he was recently [facility] presented to the ED with 2 days hx of abdominal pain. Reported use of ETOH after discharge from [facility]. Patient was then admitted for further evaluation and treatment.   CONSULTATIONS:   GI  PERTINENT RADIOLOGIC STUDIES: Mr Abdomen W Wo Contrast  11/20/2013   CLINICAL DATA:  Acute pancreatitis. Indeterminate pancreatic lesion seen on recent ultrasound.  EXAM: MRI ABDOMEN WITHOUT AND WITH CONTRAST  TECHNIQUE: Multiplanar multisequence MR imaging of the abdomen was performed both before and after the administration of intravenous contrast.  CONTRAST:  13mL MULTIHANCE GADOBENATE DIMEGLUMINE 529 MG/ML IV SOLN  COMPARISON:  Abdomen ultrasound on 11/18/2013 and prior CT on 08/17/2012  FINDINGS: Moderate diffuse pancreatic edema and peripancreatic inflammatory changes are seen, consistent with acute pancreatitis. 2-3 tiny cystic foci are seen in the pancreatic body measuring up up to 11 mm, and several other tiny less than 1 cm cystic foci are seen in the pancreatic uncinate process. These most likely represent tiny pancreatic pseudocysts in the setting of pancreatitis. No solid pancreatic masses are identified. No evidence of pancreatic ductal dilatation.  No significant biliary ductal dilatation with common bile duct measuring 6 mm. Gallbladder is unremarkable. No liver masses are identified. No evidence of portal or splenic vein thrombosis. No evidence of splenomegaly. The adrenal glands and kidneys are normal in appearance. No evidence hydronephrosis Mild ascites noted in both the right and left upper quadrants.  IMPRESSION: Moderate acute pancreatitis, with mild abdominal ascites.  Several tiny cystic foci in the pancreatic uncinate process and body measuring up to 1 cm, most likely representing tiny pancreatic pseudocysts in the setting of  pancreatitis. No evidence of pancreatic mass.   Electronically Signed   By: Earle Gell M.D.   On: 11/20/2013 12:04   US Abdomen Complete  11/18/2013   CLINICAL DATA:  Abdominal pain.  Pancreatitis.  EXAM: ULTRASOUND ABDOMEN COMPLETE  COMPARISON:  Ultrasound 10/19/2013 and CT on 08/17/2012  FINDINGS: Gallbladder:  No gallstones or wall thickening visualized. No sonographic Murphy sign noted.  Common bile duct:  Diameter: 8 mm, which is mildly dilated  Liver:  No focal lesion identified. Within normal limits in parenchymal echogenicity.  IVC:  No abnormality visualized.  Pancreas:  Enlarged with heterogeneous echotexture, suspicious for acute pancreatitis. 8 mm hypoechoic nodule along the ventral surface  of the pancreatic body, which could represent a complex cyst or solid mass.  Spleen:  Size and appearance within normal limits.  Right Kidney:  Length: 11.7 cm. Echogenicity within normal limits. No mass or hydronephrosis visualized.  Left Kidney:  Length: 11.0 cm. Echogenicity within normal limits. No mass or hydronephrosis visualized.  Abdominal aorta:  No aneurysm visualized.  Other findings:  None.  IMPRESSION: Enlarged and heterogeneous pancreas, suspicious for acute pancreatitis. 8 mm complex cyst versus solid mass along the ventral surface of pancreatic body. Recommend abdomen MRI without and with contrast for further characterization.  Mild biliary ductal dilatation with common bile duct measuring 8 mm. This could also be further evaluated by MRCP.   Electronically Signed   By: Earle Gell M.D.   On: 11/18/2013 08:28   Dg Abd Acute W/chest  11/17/2013   CLINICAL DATA:  Abdominal pain. Left lower quadrant pain in mid sternal chest pain.  EXAM: ACUTE ABDOMEN SERIES (ABDOMEN 2 VIEW & CHEST 1 VIEW)  COMPARISON:  Chest radiograph 10/17/2013. Abdominal radiograph 10/14/2013.  FINDINGS: There is no evidence of dilated bowel loops or free intraperitoneal air. No radiopaque calculi or significant stool burden.  No abdominal mass effect. There is a focal area of sclerosis and possible central lucency associated with the right tenth rib.  The heart. mediastinal, and hilar contours are normal. The lungs are well expanded and clear. The trachea is midline. There is mild gaseous distention of the upper thoracic esophagus.  IMPRESSION: 1. No acute cardiopulmonary disease. Nonobstructive bowel gas pattern. 2. Focal sclerosis associated with the right tenth rib. Question if this could reflect a healing rib fracture.   Electronically Signed   By: Curlene Dolphin M.D.   On: 11/17/2013 17:42     PERTINENT LAB RESULTS: CBC: No results found for this basename: WBC, HGB, HCT, PLT,  in the last 72 hours CMET CMP     Component Value Date/Time   NA 140 11/26/2013 0515   K 3.7 11/26/2013 0515   CL 104 11/26/2013 0515   CO2 27 11/26/2013 0515   GLUCOSE 80 11/26/2013 0515   BUN <3* 11/26/2013 0515   CREATININE 0.58 11/26/2013 0515   CREATININE 0.65 11/08/2012 1226   CALCIUM 8.7 11/26/2013 0515   PROT 6.1 11/26/2013 0515   ALBUMIN 2.6* 11/26/2013 0515   AST 29 11/26/2013 0515   ALT 34 11/26/2013 0515   ALKPHOS 90 11/26/2013 0515   BILITOT 0.3 11/26/2013 0515   GFRNONAA >90 11/26/2013 0515   GFRNONAA >89 11/08/2012 1226   GFRAA >90 11/26/2013 0515   GFRAA >89 11/08/2012 1226    GFR Estimated Creatinine Clearance: 103.5 ml/min (by C-G formula based on Cr of 0.58).  Recent Labs  11/24/13 0359 11/26/13 0515  LIPASE 126* 131*   No results found for this basename: CKTOTAL, CKMB, CKMBINDEX, TROPONINI,  in the last 72 hours No components found with this basename: POCBNP,  No results found for this basename: DDIMER,  in the last 72 hours No results found for this basename: HGBA1C,  in the last 72 hours No results found for this basename: CHOL, HDL, LDLCALC, TRIG, CHOLHDL, LDLDIRECT,  in the last 72 hours No results found for this basename: TSH, T4TOTAL, FREET3, T3FREE, THYROIDAB,  in the last 72 hours No results found for this  basename: VITAMINB12, FOLATE, FERRITIN, TIBC, IRON, RETICCTPCT,  in the last 72 hours Coags: No results found for this basename: PT, INR,  in the last 72 hours Microbiology: Recent Results (from  the past 240 hour(s))  CLOSTRIDIUM DIFFICILE BY PCR     Status: None   Collection Time    11/22/13  6:50 PM      Result Value Ref Range Status   C difficile by pcr NEGATIVE  NEGATIVE Final     BRIEF HOSPITAL COURSE:   Principal Problem: Acute on Chronic Pancreatitis  -likely alcoholic. Claims that after he recently got discharged from [facility] had a "few"drinks (Alcohol level on 7/12-111!!)  -Initially given clear liquids-but kept NPO from 7/14 given worsening pain. Lipase levels downtrending, slow improvement, diet finally advanced to soft/low fat diet by the time of discharge. Has chronic abdominal pain, per patient current pain is very close to usual baseline. Was seen by GI on 7/20, who recommended to continue to with current care-to either diet as inpatient or outpatient. Patient is now requesting discharge, his friend at bedside-who is a Therapist, sports from [place] will be with him for the next few days, she is familiar as to how to advance diet. I have instructed them to stay of either a full liquid/sift diet and slowly advance-but after talking to their PCP-they have a appt coming up on 7/23.They both have claimed understanding. Patient was counseled multiple times regarding importance of stopping ETOH!!  -Abd Ultrasound on 7/13 showed changes consistent with pancreatitis, however also showed a cystic lesion, MRI Abd on 7/15 showed moderate acute pancreatitis and small pancreatic pseudocysts measuring up to 1 cm.Will need repeat imaging at some point-suggest repeat CT or MRI abd in 3-4 weeks   Active Problems:  Etoh Abuse  -awake, alert no withdrawal symptoms at discharge. -treated with Ativan per [facility] protocol. Continue with Thiamine and folate on discharge -counseled regarding importance of quitting  and risk of life threatening pancreatitis   Suspected Alcoholic Hepatitis  -AST almost 2 times ALT-given hx of ETOH intake-high suspicion for ETOH hepatitis  - LFT's have normalized   Hyponatremia  -resolved  -suspected beer potomania   History of depression with suicide attempts in the past 2 admits to behavior health in the left one month  -c/w Seroquel,Zoloft  -denies any suicidal or homicidal ideation at present   Tachycardia with history of WPW  -c/w Metoprolol-stable   Tobacco Abuse  -c/w transdermal Nicotine  -counseled extensively   Severe Malnutrition  -in the context of chronic illness, continue with supplements    TODAY-DAY OF DISCHARGE:  Subjective:   Elnathan Fulford today has no headache,no chest abdominal pain,no new weakness tingling or numbness, feels much better wants to go home today.   Objective:   Blood pressure 133/90, pulse 81, temperature 98.1 F (36.7 C), temperature source Oral, resp. rate 18, height 5\' 9"  (1.753 m), weight 62.143 kg (137 lb), SpO2 95.00%.  Intake/Output Summary (Last 24 hours) at 11/26/13 1422 Last data filed at 11/25/13 1917  Gross per 24 hour  Intake    222 ml  Output      0 ml  Net    222 ml   Filed Weights   11/17/13 1443  Weight: 62.143 kg (137 lb)    Exam Awake Alert, Oriented *3, No new F.N deficits, Normal affect [place].AT,PERRAL Supple Neck,No JVD, No cervical lymphadenopathy appriciated.  Symmetrical Chest wall movement, Good air movement bilaterally, CTAB RRR,No Gallops,Rubs or new Murmurs, No Parasternal Heave +ve B.Sounds, Abd Soft,mildly tender in epigastric area, No organomegaly appriciated, No rebound -guarding or rigidity. No Cyanosis, Clubbing or edema, No new Rash or bruise  DISCHARGE CONDITION: Stable  DISPOSITION: Home  DISCHARGE INSTRUCTIONS:    Activity:  As tolerated with Full fall precautions use walker/cane & assistance as needed  Diet recommendation: Heart Healthy diet- Stay on full  liquids/soft diet for 10 days, only advance after talking with your Primary care MD.        Discharge Instructions   Call MD for:  persistant nausea and vomiting    Complete by:  As directed      Call MD for:  severe uncontrolled pain    Complete by:  As directed      Diet - low sodium heart healthy    Complete by:  As directed   Stay on full liquids/soft diet for 10 days, only advance after talking with your Primary care MD.     Discharge instructions    Complete by:  As directed   Return to the ED if pain worsens, or if you develop severe nausea, vomiting     Increase activity slowly    Complete by:  As directed            Follow-up Information   Follow up with [facility]     On 11/28/2013. (4 :30  for hospital follow up)    Contact information:   [place] [id number] [phone]      Follow up with [facility]     On 12/19/2013. (12 for orange card application)    Contact information:   [place] [id number] [phone]        Total Time spent on discharge equals 45 minutes.  SignedOren Binet 11/26/2013 2:22 PM  **Disclaimer: This note may have been dictated with voice recognition software. Similar sounding words can inadvertently be transcribed and this note may contain transcription errors which may not have been corrected upon publication of note.**

## 2013-11-26 NOTE — Progress Notes (Signed)
Pt was able to tolerate food without vomiting. Will continue to monitor.

## 2013-11-28 ENCOUNTER — Ambulatory Visit: Payer: MEDICAID | Attending: Internal Medicine | Admitting: Internal Medicine

## 2013-11-28 ENCOUNTER — Encounter: Payer: Self-pay | Admitting: Internal Medicine

## 2013-11-28 VITALS — BP 119/79 | HR 82 | Temp 98.1°F | Resp 16 | Ht 69.0 in | Wt 132.0 lb

## 2013-11-28 DIAGNOSIS — I10 Essential (primary) hypertension: Secondary | ICD-10-CM | POA: Insufficient documentation

## 2013-11-28 DIAGNOSIS — K852 Alcohol induced acute pancreatitis without necrosis or infection: Secondary | ICD-10-CM | POA: Insufficient documentation

## 2013-11-28 DIAGNOSIS — F1910 Other psychoactive substance abuse, uncomplicated: Secondary | ICD-10-CM | POA: Insufficient documentation

## 2013-11-28 DIAGNOSIS — F3289 Other specified depressive episodes: Secondary | ICD-10-CM | POA: Insufficient documentation

## 2013-11-28 DIAGNOSIS — F329 Major depressive disorder, single episode, unspecified: Secondary | ICD-10-CM | POA: Insufficient documentation

## 2013-11-28 DIAGNOSIS — W57XXXA Bitten or stung by nonvenomous insect and other nonvenomous arthropods, initial encounter: Secondary | ICD-10-CM

## 2013-11-28 DIAGNOSIS — J45909 Unspecified asthma, uncomplicated: Secondary | ICD-10-CM | POA: Insufficient documentation

## 2013-11-28 DIAGNOSIS — F1010 Alcohol abuse, uncomplicated: Secondary | ICD-10-CM

## 2013-11-28 DIAGNOSIS — F17200 Nicotine dependence, unspecified, uncomplicated: Secondary | ICD-10-CM | POA: Insufficient documentation

## 2013-11-28 DIAGNOSIS — F32A Depression, unspecified: Secondary | ICD-10-CM | POA: Insufficient documentation

## 2013-11-28 DIAGNOSIS — Z79899 Other long term (current) drug therapy: Secondary | ICD-10-CM | POA: Insufficient documentation

## 2013-11-28 DIAGNOSIS — T148 Other injury of unspecified body region: Secondary | ICD-10-CM

## 2013-11-28 DIAGNOSIS — K859 Acute pancreatitis without necrosis or infection, unspecified: Secondary | ICD-10-CM | POA: Insufficient documentation

## 2013-11-28 DIAGNOSIS — R109 Unspecified abdominal pain: Secondary | ICD-10-CM | POA: Insufficient documentation

## 2013-11-28 MED ORDER — PANCRELIPASE (LIP-PROT-AMYL) 12000-38000 UNITS PO CPEP: 1.0000 | ORAL_CAPSULE | Freq: Three times a day (TID) | ORAL | Status: DC

## 2013-11-28 MED ORDER — IBUPROFEN 800 MG PO TABS: 800.0000 mg | ORAL_TABLET | Freq: Three times a day (TID) | ORAL | Status: DC | PRN

## 2013-11-28 MED ORDER — PANTOPRAZOLE SODIUM 40 MG PO TBEC: 40.0000 mg | DELAYED_RELEASE_TABLET | Freq: Two times a day (BID) | ORAL | Status: DC

## 2013-11-28 MED ORDER — GI COCKTAIL ~~LOC~~: 30.0000 mL | Freq: Three times a day (TID) | ORAL | Status: DC | PRN

## 2013-11-28 MED ORDER — PERMETHRIN 5 % EX CREA: 1.0000 "application " | TOPICAL_CREAM | Freq: Once | CUTANEOUS | Status: DC

## 2013-11-28 MED ORDER — ADULT MULTIVITAMIN W/MINERALS CH: 1.0000 | ORAL_TABLET | Freq: Every day | ORAL | Status: DC

## 2013-11-28 MED ORDER — SERTRALINE HCL 25 MG PO TABS: 75.0000 mg | ORAL_TABLET | Freq: Every day | ORAL | Status: DC

## 2013-11-28 MED ORDER — THIAMINE HCL 100 MG PO TABS: 100.0000 mg | ORAL_TABLET | Freq: Every day | ORAL | Status: DC

## 2013-11-28 MED ORDER — METOPROLOL TARTRATE 50 MG PO TABS: 50.0000 mg | ORAL_TABLET | Freq: Two times a day (BID) | ORAL | Status: DC

## 2013-11-28 MED ORDER — LORAZEPAM 1 MG PO TABS: 1.0000 mg | ORAL_TABLET | Freq: Three times a day (TID) | ORAL | Status: DC | PRN

## 2013-11-28 MED ORDER — QUETIAPINE FUMARATE 100 MG PO TABS: 100.0000 mg | ORAL_TABLET | Freq: Every day | ORAL | Status: DC

## 2013-11-28 MED ORDER — ONDANSETRON HCL 4 MG PO TABS
4.0000 mg | ORAL_TABLET | Freq: Four times a day (QID) | ORAL | Status: DC | PRN
Start: 2013-11-28 — End: 2013-12-11

## 2013-11-28 NOTE — Progress Notes (Signed)
Pt is here following up on his chronic pancreatitis.

## 2013-11-28 NOTE — Patient Instructions (Signed)
°Acute Pancreatitis °Acute pancreatitis is a disease in which the pancreas becomes suddenly inflamed. The pancreas is a large gland located behind your stomach. The pancreas produces enzymes that help digest food. The pancreas also releases the hormones glucagon and insulin that help regulate blood sugar. Damage to the pancreas occurs when the digestive enzymes from the pancreas are activated and begin attacking the pancreas before being released into the intestine. Most acute attacks last a couple of days and can cause serious complications. Some people become dehydrated and develop low blood pressure. In severe cases, bleeding into the pancreas can lead to shock and can be life-threatening. The lungs, heart, and kidneys may fail. °CAUSES  °Pancreatitis can happen to anyone. In some cases, the cause is unknown. Most cases are caused by: °· Alcohol abuse. °· Gallstones. °Other less common causes are: °· Certain medicines. °· Exposure to certain chemicals. °· Infection. °· Damage caused by an accident (trauma). °· Abdominal surgery. °SYMPTOMS  °· Pain in the upper abdomen that may radiate to the back. °· Tenderness and swelling of the abdomen. °· Nausea and vomiting. °DIAGNOSIS  °Your caregiver will perform a physical exam. Blood and stool tests may be done to confirm the diagnosis. Imaging tests may also be done, such as X-rays, CT scans, or an ultrasound of the abdomen. °TREATMENT  °Treatment usually requires a stay in the hospital. Treatment may include: °· Pain medicine. °· Fluid replacement through an intravenous line (IV). °· Placing a tube in the stomach to remove stomach contents and control vomiting. °· Not eating for 3 or 4 days. This gives your pancreas a rest, because enzymes are not being produced that can cause further damage. °· Antibiotic medicines if your condition is caused by an infection. °· Surgery of the pancreas or gallbladder. °HOME CARE INSTRUCTIONS  °· Follow the diet advised by your  caregiver. This may involve avoiding alcohol and decreasing the amount of fat in your diet. °· Eat smaller, more frequent meals. This reduces the amount of digestive juices the pancreas produces. °· Drink enough fluids to keep your urine clear or pale yellow. °· Only take over-the-counter or prescription medicines as directed by your caregiver. °· Avoid drinking alcohol if it caused your condition. °· Do not smoke. °· Get plenty of rest. °· Check your blood sugar at home as directed by your caregiver. °· Keep all follow-up appointments as directed by your caregiver. °SEEK MEDICAL CARE IF:  °· You do not recover as quickly as expected. °· You develop new or worsening symptoms. °· You have persistent pain, weakness, or nausea. °· You recover and then have another episode of pain. °SEEK IMMEDIATE MEDICAL CARE IF:  °· You are unable to eat or keep fluids down. °· Your pain becomes severe. °· You have a fever or persistent symptoms for more than 2 to 3 days. °· You have a fever and your symptoms suddenly get worse. °· Your skin or the white part of your eyes turn yellow (jaundice). °· You develop vomiting. °· You feel dizzy, or you faint. °· Your blood sugar is high (over 300 mg/dL). °MAKE SURE YOU:  °· Understand these instructions. °· Will watch your condition. °· Will get help right away if you are not doing well or get worse. °Document Released: 04/25/2005 Document Revised: 10/25/2011 Document Reviewed: 08/04/2011 °[employer]® Patient Information ©2015 [employer]. This information is not intended to replace advice given to you by your health care provider. Make sure you discuss any questions you have   with your health care provider. ° °

## 2013-11-28 NOTE — Progress Notes (Signed)
Patient ID: [name], male   DOB: 01/17/1971, 43 y.o.   MRN: [id number]   [name], is a 43 y.o. male  EXB:[id number]  NUU:[id number]  DOB - [birth year]  CC:  Chief Complaint  Patient presents with  . Follow-up       HPI: [name] is a 43 y.o. male here today to establish medical care. Patient is known to have hypertension, chronic alcohol pancreatitis, alcoholic hepatitis, polysubstance abuse including cocaine alcohol and tobacco, major depression with previous suicide attempt for which he was admitted and managed at the behavioral unit here today for medication refills and to establish medical care. He has been trying lately to quit the use of alcohol. He is presently in a group home , has no significant complaint. Patient is undergoing rehabilitation. He continues to smoke heavily about one and half pack of cigarettes per day. His blood pressure is under control as well as his blood sugar. His medications are listed below. He has no body rashes and itching from bug bites. Patient has No headache, No chest pain, No abdominal pain - No Nausea, No new weakness tingling or numbness, No Cough - SOB.  Allergies  Allergen Reactions  . Trazodone And Nefazodone Other (See Comments)    Muscle spasms  . Shellfish-Derived Products Nausea And Vomiting   Past Medical History  Diagnosis Date  . Hypertension   . Asthma   . Pancreatitis   . Cocaine abuse   . Depression   . H/O suicide attempt   . Heart murmur     "when he was little" (03/06/2013)  . Shortness of breath     "can happen at anytime" (03/06/2013)  . Anemia   . H/O hiatal hernia   . GERD (gastroesophageal reflux disease)   . Arthritis     "knees" (03/06/2013)  . Anxiety   . WPW (Wolff-Parkinson-White syndrome)     [name] 03/06/2013   Current Outpatient Prescriptions on File Prior to Visit  Medication Sig Dispense Refill  . albuterol (PROVENTIL HFA;VENTOLIN HFA) 108 (90 BASE) MCG/ACT inhaler Inhale 1-2 puffs  into the lungs every 6 (six) hours as needed for wheezing or shortness of breath.  1 Inhaler  0  . feeding supplement, ENSURE COMPLETE, (ENSURE COMPLETE) LIQD Take 237 mLs by mouth 2 (two) times daily between meals.  60 Bottle  0  . folic acid (FOLVITE) 1 MG tablet Take 1 tablet (1 mg total) by mouth daily.  30 tablet  0  . oxyCODONE 10 MG TABS Take 1-1.5 tablets (10-15 mg total) by mouth every 4 (four) hours as needed for moderate pain.  40 tablet  0  . nicotine (NICODERM CQ - DOSED IN MG/24 HOURS) 14 mg/24hr patch Place 1 patch (14 mg total) onto the skin daily. For nicotine dependence  28 patch  0  . senna (SENOKOT) 8.6 MG TABS tablet Take 2 tablets (17.2 mg total) by mouth daily as needed for mild constipation.  120 each  0   No current facility-administered medications on file prior to visit.   Family History  Problem Relation Age of Onset  . Hypertension Other   . Coronary artery disease Other    History   Social History  . Marital Status: Single    Spouse Name: N/A    Number of Children: N/A  . Years of Education: N/A   Occupational History  . Not on file.   Social History Main Topics  . Smoking status: Current Every Day Smoker --  1.50 packs/day for 30 years    Types: Cigarettes  . Smokeless tobacco: Current User  . Alcohol Use: Yes     Comment: 12 pk beer daily 12oz cans liquor occasionally  . Drug Use: Yes    Special: Cocaine, Marijuana     Comment: 03/06/2013 "Cocaine 2012, Marijuana " 10/05/13 smokes marijuana  . Sexual Activity: Yes   Other Topics Concern  . Not on file   Social History Narrative  . No narrative on file    Review of Systems: Constitutional: Negative for fever, chills, diaphoresis, activity change, appetite change and fatigue. HENT: Negative for ear pain, nosebleeds, congestion, facial swelling, rhinorrhea, neck pain, neck stiffness and ear discharge.  Eyes: Negative for pain, discharge, redness, itching and visual disturbance. Respiratory:  Negative for cough, choking, chest tightness, shortness of breath, wheezing and stridor.  Cardiovascular: Negative for chest pain, palpitations and leg swelling. Gastrointestinal: Negative for abdominal distention. Genitourinary: Negative for dysuria, urgency, frequency, hematuria, flank pain, decreased urine volume, difficulty urinating and dyspareunia.  Musculoskeletal: Negative for back pain, joint swelling, arthralgia and gait problem. Neurological: Negative for dizziness, tremors, seizures, syncope, facial asymmetry, speech difficulty, weakness, light-headedness, numbness and headaches.  Hematological: Negative for adenopathy. Does not bruise/bleed easily. Psychiatric/Behavioral: Negative for hallucinations, behavioral problems, confusion, dysphoric mood, decreased concentration and agitation.    Objective:   Filed Vitals:   11/28/13 1652  BP: 119/79  Pulse: 82  Temp: 98.1 F (36.7 C)  Resp: 16    Physical Exam: Constitutional: Patient appears malnourished, thin built. No distress but Chronically ill-looking HENT: Normocephalic, atraumatic, External right and left ear normal. Oropharynx is clear and moist.  Eyes: Conjunctivae and EOM are normal. PERRLA, no scleral icterus. Neck: Normal ROM. Neck supple. No JVD. No tracheal deviation. No thyromegaly. CVS: RRR, S1/S2 +, no murmurs, no gallops, no carotid bruit.  Pulmonary: Effort and breath sounds normal, no stridor, rhonchi, wheezes, rales.  Abdominal: Soft. BS +, no distension, tenderness, rebound or guarding.  Musculoskeletal: Normal range of motion. No edema and no tenderness.  Lymphadenopathy: No lymphadenopathy noted, cervical, inguinal or axillary Neuro: Alert. Normal reflexes, muscle tone coordination. No cranial nerve deficit. Skin: Skin is warm and dry. Not diaphoretic. No erythema. No pallor. Psychiatric: Normal mood and affect. Behavior, judgment, thought content normal.  Lab Results  Component Value Date   WBC 6.9  11/21/2013   HGB 9.4* 11/21/2013   HCT 29.7* 11/21/2013   MCV 97.4 11/21/2013   PLT 171 11/21/2013   Lab Results  Component Value Date   CREATININE 0.58 11/26/2013   BUN <3* 11/26/2013   NA 140 11/26/2013   K 3.7 11/26/2013   CL 104 11/26/2013   CO2 27 11/26/2013    Lab Results  Component Value Date   HGBA1C 4.5 10/07/2013   Lipid Panel     Component Value Date/Time   CHOL 128 11/21/2013 0650   TRIG 52 11/21/2013 0650   HDL 57 11/21/2013 0650   CHOLHDL 2.2 11/21/2013 0650   VLDL 10 11/21/2013 0650   LDLCALC 61 11/21/2013 0650       Assessment and plan:   1. Tobacco use disorder Patient was extensively counseled on smoking cessation  2. Abdominal pain, unspecified site  - pantoprazole (PROTONIX) 40 MG tablet; Take 1 tablet (40 mg total) by mouth 2 (two) times daily.  Dispense: 60 tablet; Refill: 3 - ondansetron (ZOFRAN) 4 MG tablet; Take 1 tablet (4 mg total) by mouth every 6 (six) hours as needed for nausea or  vomiting.  Dispense: 20 tablet; Refill: 0 - ibuprofen (ADVIL,MOTRIN) 800 MG tablet; Take 1 tablet (800 mg total) by mouth every 8 (eight) hours as needed.  Dispense: 30 tablet; Refill: 0  3. Unspecified essential hypertension I will stop his hydralazine since patient is complaining of dizziness especially after standing from a sitting position , he has not taken his medications today her blood pressure is normal. - metoprolol (LOPRESSOR) 50 MG tablet; Take 1 tablet (50 mg total) by mouth 2 (two) times daily. For high blood pressure  Dispense: 180 tablet; Refill: 3 DASH diet  4. Alcohol abuse, unspecified  - LORazepam (ATIVAN) 1 MG tablet; Take 1 tablet (1 mg total) by mouth every 8 (eight) hours as needed for anxiety.  Dispense: 30 tablet; Refill: 0 - thiamine 100 MG tablet; Take 1 tablet (100 mg total) by mouth daily.  Dispense: 90 tablet; Refill: 3 - Multiple Vitamin (MULTIVITAMIN WITH MINERALS) TABS tablet; Take 1 tablet by mouth daily. For low vitamin  Dispense: 90 tablet;  Refill: 3  5. Alcohol-induced acute pancreatitis  - lipase/protease/amylase (CREON-12/PANCREASE) 12000 UNITS CPEP capsule; Take 1 capsule by mouth 3 (three) times daily before meals.  Dispense: 270 capsule; Refill: 3 - Alum & Mag Hydroxide-Simeth (GI COCKTAIL) SUSP suspension; Take 30 mLs by mouth 3 (three) times daily as needed for indigestion. Shake well.  Dispense: 500 mL; Refill: 3  6. Bug bite  - permethrin (ACTICIN) 5 % cream; Apply 1 application topically once.  Dispense: 120 g; Refill: 1  7. Depression  - QUEtiapine (SEROQUEL) 100 MG tablet; Take 1 tablet (100 mg total) by mouth at bedtime. For mood control  Dispense: 30 tablet; Refill: 3 - sertraline (ZOLOFT) 25 MG tablet; Take 3 tablets (75 mg total) by mouth daily. For depression  Dispense: 90 tablet; Refill: 3 Patient was encouraged to followup with psychiatrist Patient was given the option of seeing a social worker in the clinic today but he prefers to come back for an appointment  Patient was extensively counseled about nutrition and exercise  Return if symptoms worsen or fail to improve, for Follow up Pain and comorbidities, Abdominal Pain.  The patient was given clear instructions to go to ER or return to medical center if symptoms don't improve, worsen or new problems develop. The patient verbalized understanding. The patient was told to call to get lab results if they haven't heard anything in the next week.     This note has been created with Surveyor, quantity. Any transcriptional errors are unintentional.    Angelica Chessman, MD, [place], [place], [place], [place]   11/28/2013, 5:38 PM

## 2013-12-03 ENCOUNTER — Encounter (HOSPITAL_COMMUNITY): Payer: Self-pay | Admitting: Emergency Medicine

## 2013-12-03 ENCOUNTER — Emergency Department (HOSPITAL_COMMUNITY)
Admission: EM | Admit: 2013-12-03 | Discharge: 2013-12-03 | Disposition: A | Discharge status: Home / Self Care | Payer: Self-pay | Attending: Emergency Medicine | Admitting: Emergency Medicine

## 2013-12-03 DIAGNOSIS — J45909 Unspecified asthma, uncomplicated: Secondary | ICD-10-CM | POA: Insufficient documentation

## 2013-12-03 DIAGNOSIS — K859 Acute pancreatitis without necrosis or infection, unspecified: Secondary | ICD-10-CM | POA: Insufficient documentation

## 2013-12-03 DIAGNOSIS — IMO0002 Reserved for concepts with insufficient information to code with codable children: Secondary | ICD-10-CM

## 2013-12-03 DIAGNOSIS — F17200 Nicotine dependence, unspecified, uncomplicated: Secondary | ICD-10-CM | POA: Insufficient documentation

## 2013-12-03 DIAGNOSIS — M1710 Unilateral primary osteoarthritis, unspecified knee: Secondary | ICD-10-CM | POA: Insufficient documentation

## 2013-12-03 DIAGNOSIS — R011 Cardiac murmur, unspecified: Secondary | ICD-10-CM | POA: Insufficient documentation

## 2013-12-03 DIAGNOSIS — F411 Generalized anxiety disorder: Secondary | ICD-10-CM | POA: Insufficient documentation

## 2013-12-03 DIAGNOSIS — R1012 Left upper quadrant pain: Secondary | ICD-10-CM | POA: Insufficient documentation

## 2013-12-03 DIAGNOSIS — F329 Major depressive disorder, single episode, unspecified: Secondary | ICD-10-CM | POA: Insufficient documentation

## 2013-12-03 DIAGNOSIS — D649 Anemia, unspecified: Secondary | ICD-10-CM | POA: Insufficient documentation

## 2013-12-03 DIAGNOSIS — F3289 Other specified depressive episodes: Secondary | ICD-10-CM | POA: Insufficient documentation

## 2013-12-03 DIAGNOSIS — Z79899 Other long term (current) drug therapy: Secondary | ICD-10-CM | POA: Insufficient documentation

## 2013-12-03 DIAGNOSIS — K219 Gastro-esophageal reflux disease without esophagitis: Secondary | ICD-10-CM | POA: Insufficient documentation

## 2013-12-03 DIAGNOSIS — Z791 Long term (current) use of non-steroidal anti-inflammatories (NSAID): Secondary | ICD-10-CM | POA: Insufficient documentation

## 2013-12-03 DIAGNOSIS — I10 Essential (primary) hypertension: Secondary | ICD-10-CM | POA: Insufficient documentation

## 2013-12-03 DIAGNOSIS — K852 Alcohol induced acute pancreatitis without necrosis or infection: Secondary | ICD-10-CM

## 2013-12-03 LAB — CBC WITH DIFFERENTIAL/PLATELET
Basophils Absolute: 0 10*3/uL (ref 0.0–0.1)
Basophils Relative: 0 % (ref 0–1)
EOS ABS: 0.3 10*3/uL (ref 0.0–0.7)
EOS PCT: 4 % (ref 0–5)
HCT: 34.8 % — ABNORMAL LOW (ref 39.0–52.0)
Hemoglobin: 11.7 g/dL — ABNORMAL LOW (ref 13.0–17.0)
LYMPHS ABS: 2.6 10*3/uL (ref 0.7–4.0)
Lymphocytes Relative: 33 % (ref 12–46)
MCH: 31.1 pg (ref 26.0–34.0)
MCHC: 33.6 g/dL (ref 30.0–36.0)
MCV: 92.6 fL (ref 78.0–100.0)
Monocytes Absolute: 0.4 10*3/uL (ref 0.1–1.0)
Monocytes Relative: 5 % (ref 3–12)
Neutro Abs: 4.5 10*3/uL (ref 1.7–7.7)
Neutrophils Relative %: 58 % (ref 43–77)
Platelets: 388 10*3/uL (ref 150–400)
RBC: 3.76 MIL/uL — AB (ref 4.22–5.81)
RDW: 16.5 % — AB (ref 11.5–15.5)
WBC: 7.7 10*3/uL (ref 4.0–10.5)

## 2013-12-03 LAB — COMPREHENSIVE METABOLIC PANEL
ALT: 38 U/L (ref 0–53)
ANION GAP: 19 — AB (ref 5–15)
AST: 48 U/L — ABNORMAL HIGH (ref 0–37)
Albumin: 3.8 g/dL (ref 3.5–5.2)
Alkaline Phosphatase: 111 U/L (ref 39–117)
BUN: 5 mg/dL — ABNORMAL LOW (ref 6–23)
CALCIUM: 9.2 mg/dL (ref 8.4–10.5)
CO2: 20 mEq/L (ref 19–32)
Chloride: 102 mEq/L (ref 96–112)
Creatinine, Ser: 0.65 mg/dL (ref 0.50–1.35)
GFR calc non Af Amer: >90 mL/min (ref >90)
Glucose, Bld: 80 mg/dL (ref 70–99)
Potassium: 4.4 mEq/L (ref 3.7–5.3)
Sodium: 141 mEq/L (ref 137–147)
TOTAL PROTEIN: 8.1 g/dL (ref 6.0–8.3)
Total Bilirubin: 0.3 mg/dL (ref 0.3–1.2)

## 2013-12-03 LAB — LIPASE, BLOOD: Lipase: 150 U/L — ABNORMAL HIGH (ref 11–59)

## 2013-12-03 MED ORDER — ONDANSETRON HCL 4 MG/2ML IJ SOLN
4.0000 mg | Freq: Once | INTRAMUSCULAR | Status: AC
  Administered 2013-12-03: 4 mg via INTRAVENOUS
  Filled 2013-12-03: qty 2

## 2013-12-03 MED ORDER — HYDROMORPHONE HCL PF 1 MG/ML IJ SOLN
1.0000 mg | Freq: Once | INTRAMUSCULAR | Status: AC
  Administered 2013-12-03: 1 mg via INTRAVENOUS
  Filled 2013-12-03: qty 1

## 2013-12-03 MED ORDER — SODIUM CHLORIDE 0.9 % IV BOLUS (SEPSIS)
1000.0000 mL | Freq: Once | INTRAVENOUS | Status: AC
  Administered 2013-12-03: 1000 mL via INTRAVENOUS

## 2013-12-03 MED ORDER — GI COCKTAIL ~~LOC~~
30.0000 mL | Freq: Once | ORAL | Status: AC
  Administered 2013-12-03: 30 mL via ORAL
  Filled 2013-12-03: qty 30

## 2013-12-03 NOTE — ED Provider Notes (Signed)
CSN: [id number]     Arrival date & time 12/03/13  1145 History   First MD Initiated Contact with Patient 12/03/13 1454     Chief Complaint  Patient presents with  . Abdominal Pain     (Consider location/radiation/quality/duration/timing/severity/associated sxs/prior Treatment) Patient is a 43 y.o. male presenting with abdominal pain. The history is provided by the patient. No language interpreter was used.  Abdominal Pain Pain location:  LUQ Pain quality: sharp   Pain radiates to:  Does not radiate Pain severity:  Moderate Associated symptoms: diarrhea and nausea   Associated symptoms: no chest pain, no chills, no dysuria, no fever, no shortness of breath and no vomiting   Associated symptoms comment:  Recurrent LUQ abdominal pain similar to multiple previous episodes of alcoholic pancreatitis. He was discharged home on 11/26/13, admits to further alcohol use, and returns with recurrent abdominal pain that started over the last couple of days and is associated with nausea (no vomiting) and diarrhea. No fever. No bloody bowel movements.    Past Medical History  Diagnosis Date  . Hypertension   . Asthma   . Pancreatitis   . Cocaine abuse   . Depression   . H/O suicide attempt   . Heart murmur     "when he was little" (03/06/2013)  . Shortness of breath     "can happen at anytime" (03/06/2013)  . Anemia   . H/O hiatal hernia   . GERD (gastroesophageal reflux disease)   . Arthritis     "knees" (03/06/2013)  . Anxiety   . WPW (Wolff-Parkinson-White syndrome)     [name] 03/06/2013   Past Surgical History  Procedure Laterality Date  . Facial fracture surgery Left 1990's    "result of trauma" (03/06/2013)  . Eye surgery Left 1990's    "result of trauma" (03/06/2013)   Family History  Problem Relation Age of Onset  . Hypertension Other   . Coronary artery disease Other    History  Substance Use Topics  . Smoking status: Current Every Day Smoker -- 1.50 packs/day for 30  years    Types: Cigarettes  . Smokeless tobacco: Current User  . Alcohol Use: Yes     Comment: 12 pk beer daily 12oz cans liquor occasionally    Review of Systems  Constitutional: Negative for fever and chills.  Respiratory: Negative.  Negative for shortness of breath.   Cardiovascular: Negative.  Negative for chest pain.  Gastrointestinal: Positive for nausea, abdominal pain and diarrhea. Negative for vomiting.  Genitourinary: Negative.  Negative for dysuria.  Musculoskeletal: Negative.  Negative for myalgias.  Skin: Negative.   Neurological: Negative.       Allergies  Trazodone and nefazodone and Shellfish-derived products  Home Medications   Prior to Admission medications   Medication Sig Start Date End Date Taking? Authorizing Provider  albuterol (PROVENTIL HFA;VENTOLIN HFA) 108 (90 BASE) MCG/ACT inhaler Inhale 1-2 puffs into the lungs every 6 (six) hours as needed for wheezing or shortness of breath. 11/26/13   Jonetta Osgood, MD  Alum & Mag Hydroxide-Simeth (GI COCKTAIL) SUSP suspension Take 30 mLs by mouth 3 (three) times daily as needed for indigestion. Shake well. 11/28/13   Angelica Chessman, MD  feeding supplement, ENSURE COMPLETE, (ENSURE COMPLETE) LIQD Take 237 mLs by mouth 2 (two) times daily between meals. 11/26/13   Shanker Kristeen Mans, MD  folic acid (FOLVITE) 1 MG tablet Take 1 tablet (1 mg total) by mouth daily. 11/26/13   Shanker Kristeen Mans, MD  ibuprofen (ADVIL,MOTRIN) 800 MG tablet Take 1 tablet (800 mg total) by mouth every 8 (eight) hours as needed. 11/28/13   Angelica Chessman, MD  lipase/protease/amylase (CREON-12/PANCREASE) 12000 UNITS CPEP capsule Take 1 capsule by mouth 3 (three) times daily before meals. 11/28/13   Angelica Chessman, MD  LORazepam (ATIVAN) 1 MG tablet Take 1 tablet (1 mg total) by mouth every 8 (eight) hours as needed for anxiety. 11/28/13   Angelica Chessman, MD  metoprolol (LOPRESSOR) 50 MG tablet Take 1 tablet (50 mg total) by mouth 2 (two)  times daily. For high blood pressure 11/28/13   Angelica Chessman, MD  Multiple Vitamin (MULTIVITAMIN WITH MINERALS) TABS tablet Take 1 tablet by mouth daily. For low vitamin 11/28/13   Angelica Chessman, MD  nicotine (NICODERM CQ - DOSED IN MG/24 HOURS) 14 mg/24hr patch Place 1 patch (14 mg total) onto the skin daily. For nicotine dependence 11/26/13   Jonetta Osgood, MD  ondansetron (ZOFRAN) 4 MG tablet Take 1 tablet (4 mg total) by mouth every 6 (six) hours as needed for nausea or vomiting. 11/28/13   Angelica Chessman, MD  oxyCODONE 10 MG TABS Take 1-1.5 tablets (10-15 mg total) by mouth every 4 (four) hours as needed for moderate pain. 11/26/13   Shanker Kristeen Mans, MD  pantoprazole (PROTONIX) 40 MG tablet Take 1 tablet (40 mg total) by mouth 2 (two) times daily. 11/28/13   Angelica Chessman, MD  permethrin (ACTICIN) 5 % cream Apply 1 application topically once. 11/28/13   Angelica Chessman, MD  QUEtiapine (SEROQUEL) 100 MG tablet Take 1 tablet (100 mg total) by mouth at bedtime. For mood control 11/28/13   Angelica Chessman, MD  senna (SENOKOT) 8.6 MG TABS tablet Take 2 tablets (17.2 mg total) by mouth daily as needed for mild constipation. 11/26/13   Shanker Kristeen Mans, MD  sertraline (ZOLOFT) 25 MG tablet Take 3 tablets (75 mg total) by mouth daily. For depression 11/28/13   Angelica Chessman, MD  thiamine 100 MG tablet Take 1 tablet (100 mg total) by mouth daily. 11/28/13   Angelica Chessman, MD   BP 130/85  Pulse 73  Temp(Src) 98.2 F (36.8 C) (Oral)  Resp 15  SpO2 100% Physical Exam  Constitutional: He is oriented to person, place, and time. He appears well-developed and well-nourished.  HENT:  Head: Normocephalic.  Neck: Normal range of motion. Neck supple.  Cardiovascular: Normal rate and regular rhythm.   Pulmonary/Chest: Effort normal and breath sounds normal.  Abdominal: Soft. Bowel sounds are normal. There is tenderness. There is no rebound and no guarding.  LUQ abdominal tenderness  with guarding. Non-distended abdomen.  Musculoskeletal: Normal range of motion.  Neurological: He is alert and oriented to person, place, and time.  Skin: Skin is warm and dry. No rash noted.  Psychiatric: He has a normal mood and affect.    ED Course  Procedures (including critical care time) Labs Review Labs Reviewed  CBC WITH DIFFERENTIAL - Abnormal; Notable for the following:    RBC 3.76 (*)    Hemoglobin 11.7 (*)    HCT 34.8 (*)    RDW 16.5 (*)    All other components within normal limits  COMPREHENSIVE METABOLIC PANEL - Abnormal; Notable for the following:    BUN 5 (*)    AST 48 (*)    Anion gap 19 (*)    All other components within normal limits  LIPASE, BLOOD - Abnormal; Notable for the following:    Lipase 150 (*)  All other components within normal limits    Imaging Review No results found.   EKG Interpretation None      MDM   Final diagnoses:  None    1. Abdominal pain 2. H/o alcoholic pancreatitis  The patient's lipase is 150, minimally elevated from level at discharge from most recent admission one week ago. No fever, VSS. Will given IV hydration and pain medication. Anticipate likely discharge home pending reassessment.   Patient reassessed and is doing better. Will give another dose of pain medication and will discharge home. Patient given return precautions.  Dewaine Oats, PA-C 12/03/13 [place], PA-C 12/03/13 1600

## 2013-12-03 NOTE — ED Notes (Signed)
Per pt was just admitted to the hospital a week ago for pancreatitis and now he is hurting again and diarrhea. sts he has been taking his pain meds with no relief.

## 2013-12-03 NOTE — Discharge Instructions (Signed)
°Acute Pancreatitis °Acute pancreatitis is a disease in which the pancreas becomes suddenly inflamed. The pancreas is a large gland located behind your stomach. The pancreas produces enzymes that help digest food. The pancreas also releases the hormones glucagon and insulin that help regulate blood sugar. Damage to the pancreas occurs when the digestive enzymes from the pancreas are activated and begin attacking the pancreas before being released into the intestine. Most acute attacks last a couple of days and can cause serious complications. Some people become dehydrated and develop low blood pressure. In severe cases, bleeding into the pancreas can lead to shock and can be life-threatening. The lungs, heart, and kidneys may fail. °CAUSES  °Pancreatitis can happen to anyone. In some cases, the cause is unknown. Most cases are caused by: °· Alcohol abuse. °· Gallstones. °Other less common causes are: °· Certain medicines. °· Exposure to certain chemicals. °· Infection. °· Damage caused by an accident (trauma). °· Abdominal surgery. °SYMPTOMS  °· Pain in the upper abdomen that may radiate to the back. °· Tenderness and swelling of the abdomen. °· Nausea and vomiting. °DIAGNOSIS  °Your caregiver will perform a physical exam. Blood and stool tests may be done to confirm the diagnosis. Imaging tests may also be done, such as X-rays, CT scans, or an ultrasound of the abdomen. °TREATMENT  °Treatment usually requires a stay in the hospital. Treatment may include: °· Pain medicine. °· Fluid replacement through an intravenous line (IV). °· Placing a tube in the stomach to remove stomach contents and control vomiting. °· Not eating for 3 or 4 days. This gives your pancreas a rest, because enzymes are not being produced that can cause further damage. °· Antibiotic medicines if your condition is caused by an infection. °· Surgery of the pancreas or gallbladder. °HOME CARE INSTRUCTIONS  °· Follow the diet advised by your  caregiver. This may involve avoiding alcohol and decreasing the amount of fat in your diet. °· Eat smaller, more frequent meals. This reduces the amount of digestive juices the pancreas produces. °· Drink enough fluids to keep your urine clear or pale yellow. °· Only take over-the-counter or prescription medicines as directed by your caregiver. °· Avoid drinking alcohol if it caused your condition. °· Do not smoke. °· Get plenty of rest. °· Check your blood sugar at home as directed by your caregiver. °· Keep all follow-up appointments as directed by your caregiver. °SEEK MEDICAL CARE IF:  °· You do not recover as quickly as expected. °· You develop new or worsening symptoms. °· You have persistent pain, weakness, or nausea. °· You recover and then have another episode of pain. °SEEK IMMEDIATE MEDICAL CARE IF:  °· You are unable to eat or keep fluids down. °· Your pain becomes severe. °· You have a fever or persistent symptoms for more than 2 to 3 days. °· You have a fever and your symptoms suddenly get worse. °· Your skin or the white part of your eyes turn yellow (jaundice). °· You develop vomiting. °· You feel dizzy, or you faint. °· Your blood sugar is high (over 300 mg/dL). °MAKE SURE YOU:  °· Understand these instructions. °· Will watch your condition. °· Will get help right away if you are not doing well or get worse. °Document Released: 04/25/2005 Document Revised: 10/25/2011 Document Reviewed: 08/04/2011 °[employer]® Patient Information ©2015 [employer]. This information is not intended to replace advice given to you by your health care provider. Make sure you discuss any questions you have   with your health care provider. ° °

## 2013-12-07 NOTE — ED Provider Notes (Signed)
Medical screening examination/treatment/procedure(s) were performed by non-physician practitioner and as supervising physician I was immediately available for consultation/collaboration.   EKG Interpretation None        Houston Siren III, MD 12/07/13 [phone]

## 2013-12-09 ENCOUNTER — Emergency Department (HOSPITAL_COMMUNITY)
Admission: EM | Admit: 2013-12-09 | Discharge: 2013-12-09 | Disposition: A | Discharge status: Home / Self Care | Payer: Self-pay | Attending: Emergency Medicine | Admitting: Emergency Medicine

## 2013-12-09 ENCOUNTER — Encounter (HOSPITAL_COMMUNITY): Payer: Self-pay | Admitting: Emergency Medicine

## 2013-12-09 ENCOUNTER — Emergency Department (HOSPITAL_COMMUNITY): Payer: Self-pay

## 2013-12-09 DIAGNOSIS — F411 Generalized anxiety disorder: Secondary | ICD-10-CM | POA: Insufficient documentation

## 2013-12-09 DIAGNOSIS — K859 Acute pancreatitis without necrosis or infection, unspecified: Secondary | ICD-10-CM | POA: Insufficient documentation

## 2013-12-09 DIAGNOSIS — F3289 Other specified depressive episodes: Secondary | ICD-10-CM | POA: Insufficient documentation

## 2013-12-09 DIAGNOSIS — K219 Gastro-esophageal reflux disease without esophagitis: Secondary | ICD-10-CM | POA: Insufficient documentation

## 2013-12-09 DIAGNOSIS — R197 Diarrhea, unspecified: Secondary | ICD-10-CM | POA: Insufficient documentation

## 2013-12-09 DIAGNOSIS — M129 Arthropathy, unspecified: Secondary | ICD-10-CM | POA: Insufficient documentation

## 2013-12-09 DIAGNOSIS — D649 Anemia, unspecified: Secondary | ICD-10-CM | POA: Insufficient documentation

## 2013-12-09 DIAGNOSIS — J45909 Unspecified asthma, uncomplicated: Secondary | ICD-10-CM | POA: Insufficient documentation

## 2013-12-09 DIAGNOSIS — Z79899 Other long term (current) drug therapy: Secondary | ICD-10-CM | POA: Insufficient documentation

## 2013-12-09 DIAGNOSIS — F329 Major depressive disorder, single episode, unspecified: Secondary | ICD-10-CM | POA: Insufficient documentation

## 2013-12-09 DIAGNOSIS — R112 Nausea with vomiting, unspecified: Secondary | ICD-10-CM | POA: Insufficient documentation

## 2013-12-09 DIAGNOSIS — I10 Essential (primary) hypertension: Secondary | ICD-10-CM | POA: Insufficient documentation

## 2013-12-09 DIAGNOSIS — F17200 Nicotine dependence, unspecified, uncomplicated: Secondary | ICD-10-CM | POA: Insufficient documentation

## 2013-12-09 DIAGNOSIS — R011 Cardiac murmur, unspecified: Secondary | ICD-10-CM | POA: Insufficient documentation

## 2013-12-09 LAB — URINALYSIS, ROUTINE W REFLEX MICROSCOPIC
Bilirubin Urine: NEGATIVE
GLUCOSE, UA: NEGATIVE mg/dL
Hgb urine dipstick: NEGATIVE
KETONES UR: NEGATIVE mg/dL
Leukocytes, UA: NEGATIVE
Nitrite: NEGATIVE
Protein, ur: NEGATIVE mg/dL
Specific Gravity, Urine: 1.017 (ref 1.005–1.030)
UROBILINOGEN UA: 0.2 mg/dL (ref 0.0–1.0)
pH: 5 (ref 5.0–8.0)

## 2013-12-09 LAB — CBC WITH DIFFERENTIAL/PLATELET
Basophils Absolute: 0 10*3/uL (ref 0.0–0.1)
Basophils Relative: 0 % (ref 0–1)
Eosinophils Absolute: 0.3 10*3/uL (ref 0.0–0.7)
Eosinophils Relative: 4 % (ref 0–5)
HCT: 35.5 % — ABNORMAL LOW (ref 39.0–52.0)
Hemoglobin: 12.1 g/dL — ABNORMAL LOW (ref 13.0–17.0)
LYMPHS ABS: 3 10*3/uL (ref 0.7–4.0)
Lymphocytes Relative: 35 % (ref 12–46)
MCH: 31.1 pg (ref 26.0–34.0)
MCHC: 34.1 g/dL (ref 30.0–36.0)
MCV: 91.3 fL (ref 78.0–100.0)
Monocytes Absolute: 0.7 10*3/uL (ref 0.1–1.0)
Monocytes Relative: 8 % (ref 3–12)
NEUTROS ABS: 4.7 10*3/uL (ref 1.7–7.7)
Neutrophils Relative %: 53 % (ref 43–77)
Platelets: 281 10*3/uL (ref 150–400)
RBC: 3.89 MIL/uL — AB (ref 4.22–5.81)
RDW: 16.5 % — ABNORMAL HIGH (ref 11.5–15.5)
WBC: 8.8 10*3/uL (ref 4.0–10.5)

## 2013-12-09 LAB — COMPREHENSIVE METABOLIC PANEL
ALT: 48 U/L (ref 0–53)
AST: 68 U/L — ABNORMAL HIGH (ref 0–37)
Albumin: 4.2 g/dL (ref 3.5–5.2)
Alkaline Phosphatase: 108 U/L (ref 39–117)
Anion gap: 18 — ABNORMAL HIGH (ref 5–15)
BUN: 4 mg/dL — ABNORMAL LOW (ref 6–23)
CO2: 22 meq/L (ref 19–32)
CREATININE: 0.69 mg/dL (ref 0.50–1.35)
Calcium: 9.2 mg/dL (ref 8.4–10.5)
Chloride: 104 mEq/L (ref 96–112)
GFR calc Af Amer: >90 mL/min (ref >90)
GLUCOSE: 90 mg/dL (ref 70–99)
Potassium: 3.4 mEq/L — ABNORMAL LOW (ref 3.7–5.3)
SODIUM: 144 meq/L (ref 137–147)
TOTAL PROTEIN: 8.5 g/dL — AB (ref 6.0–8.3)
Total Bilirubin: 0.7 mg/dL (ref 0.3–1.2)

## 2013-12-09 LAB — LIPASE, BLOOD: Lipase: 180 U/L — ABNORMAL HIGH (ref 11–59)

## 2013-12-09 LAB — TROPONIN I: Troponin I: <0.3 ng/mL (ref <0.30)

## 2013-12-09 MED ORDER — OXYCODONE-ACETAMINOPHEN 5-325 MG PO TABS: 2.0000 | ORAL_TABLET | ORAL | Status: DC | PRN

## 2013-12-09 MED ORDER — KETAMINE HCL 10 MG/ML IJ SOLN
7.0000 mg | Freq: Once | INTRAMUSCULAR | Status: AC
  Administered 2013-12-09: 7 mg via INTRAVENOUS

## 2013-12-09 MED ORDER — ONDANSETRON HCL 4 MG/2ML IJ SOLN
4.0000 mg | Freq: Once | INTRAMUSCULAR | Status: AC
Start: 2013-12-09 — End: 2013-12-09
  Administered 2013-12-09: 4 mg via INTRAVENOUS
  Filled 2013-12-09: qty 2

## 2013-12-09 MED ORDER — OXYCODONE-ACETAMINOPHEN 5-325 MG PO TABS
2.0000 | ORAL_TABLET | Freq: Once | ORAL | Status: AC
  Administered 2013-12-09: 2 via ORAL
  Filled 2013-12-09: qty 2

## 2013-12-09 MED ORDER — HYDROMORPHONE HCL PF 1 MG/ML IJ SOLN
1.0000 mg | Freq: Once | INTRAMUSCULAR | Status: AC
  Administered 2013-12-09: 1 mg via INTRAVENOUS
  Filled 2013-12-09: qty 1

## 2013-12-09 MED ORDER — HYDROMORPHONE HCL PF 1 MG/ML IJ SOLN
2.0000 mg | Freq: Once | INTRAMUSCULAR | Status: AC
  Administered 2013-12-09: 2 mg via INTRAVENOUS
  Filled 2013-12-09: qty 2

## 2013-12-09 MED ORDER — SODIUM CHLORIDE 0.9 % IV BOLUS (SEPSIS)
1000.0000 mL | Freq: Once | INTRAVENOUS | Status: AC
  Administered 2013-12-09: 1000 mL via INTRAVENOUS

## 2013-12-09 MED ORDER — ONDANSETRON HCL 4 MG PO TABS: 4.0000 mg | ORAL_TABLET | Freq: Four times a day (QID) | ORAL | Status: DC

## 2013-12-09 MED ORDER — GI COCKTAIL ~~LOC~~
30.0000 mL | Freq: Once | ORAL | Status: AC
  Administered 2013-12-09: 30 mL via ORAL
  Filled 2013-12-09: qty 30

## 2013-12-09 MED ORDER — PROMETHAZINE HCL 25 MG/ML IJ SOLN
25.0000 mg | Freq: Once | INTRAMUSCULAR | Status: AC
  Administered 2013-12-09: 25 mg via INTRAVENOUS
  Filled 2013-12-09: qty 1

## 2013-12-09 MED ORDER — OXYCODONE-ACETAMINOPHEN 5-325 MG PO TABS
1.0000 | ORAL_TABLET | Freq: Once | ORAL | Status: AC
  Administered 2013-12-09: 1 via ORAL
  Filled 2013-12-09: qty 1

## 2013-12-09 NOTE — Discharge Instructions (Signed)
Acute Pancreatitis STOP DRINKING ALCOHOL. Followup with your doctor. Return to the ED if you develop new or  worsening symptoms Acute pancreatitis is a disease in which the pancreas becomes suddenly inflamed. The pancreas is a large gland located behind your stomach. The pancreas produces enzymes that help digest food. The pancreas also releases the hormones glucagon and insulin that help regulate blood sugar. Damage to the pancreas occurs when the digestive enzymes from the pancreas are activated and begin attacking the pancreas before being released into the intestine. Most acute attacks last a couple of days and can cause serious complications. Some people become dehydrated and develop low blood pressure. In severe cases, bleeding into the pancreas can lead to shock and can be life-threatening. The lungs, heart, and kidneys may fail. CAUSES  Pancreatitis can happen to anyone. In some cases, the cause is unknown. Most cases are caused by:  Alcohol abuse.  Gallstones. Other less common causes are:  Certain medicines.  Exposure to certain chemicals.  Infection.  Damage caused by an accident (trauma).  Abdominal surgery. SYMPTOMS   Pain in the upper abdomen that may radiate to the back.  Tenderness and swelling of the abdomen.  Nausea and vomiting. DIAGNOSIS  Your caregiver will perform a physical exam. Blood and stool tests may be done to confirm the diagnosis. Imaging tests may also be done, such as X-rays, CT scans, or an ultrasound of the abdomen. TREATMENT  Treatment usually requires a stay in the hospital. Treatment may include:  Pain medicine.  Fluid replacement through an intravenous line (IV).  Placing a tube in the stomach to remove stomach contents and control vomiting.  Not eating for 3 or 4 days. This gives your pancreas a rest, because enzymes are not being produced that can cause further damage.  Antibiotic medicines if your condition is caused by an  infection.  Surgery of the pancreas or gallbladder. HOME CARE INSTRUCTIONS   Follow the diet advised by your caregiver. This may involve avoiding alcohol and decreasing the amount of fat in your diet.  Eat smaller, more frequent meals. This reduces the amount of digestive juices the pancreas produces.  Drink enough fluids to keep your urine clear or pale yellow.  Only take over-the-counter or prescription medicines as directed by your caregiver.  Avoid drinking alcohol if it caused your condition.  Do not smoke.  Get plenty of rest.  Check your blood sugar at home as directed by your caregiver.  Keep all follow-up appointments as directed by your caregiver. SEEK MEDICAL CARE IF:   You do not recover as quickly as expected.  You develop new or worsening symptoms.  You have persistent pain, weakness, or nausea.  You recover and then have another episode of pain. SEEK IMMEDIATE MEDICAL CARE IF:   You are unable to eat or keep fluids down.  Your pain becomes severe.  You have a fever or persistent symptoms for more than 2 to 3 days.  You have a fever and your symptoms suddenly get worse.  Your skin or the white part of your eyes turn yellow (jaundice).  You develop vomiting.  You feel dizzy, or you faint.  Your blood sugar is high (over 300 mg/dL). MAKE SURE YOU:   Understand these instructions.  Will watch your condition.  Will get help right away if you are not doing well or get worse. Document Released: 04/25/2005 Document Revised: 10/25/2011 Document Reviewed: 08/04/2011 [facility] Patient Information 2015 [place], [place]. This information is not intended  to replace advice given to you by your health care provider. Make sure you discuss any questions you have with your health care provider. ° °

## 2013-12-09 NOTE — ED Provider Notes (Signed)
CSN: [id number]     Arrival date & time 12/09/13  1406 History   First MD Initiated Contact with Patient 12/09/13 1911     Chief Complaint  Patient presents with  . Pancreatitis  . Diarrhea     (Consider location/radiation/quality/duration/timing/severity/associated sxs/prior Treatment) HPI Comments: Patient endorses epigastric pain left upper quadrant pain typical of previous episodes of pancreatitis. Onset last night after drinking 3 beers. He knows he should not be drinking alcohol states he is a stress as his girlfriend had an abortion. He was hospitalized 3 weeks ago for similar symptoms. Denies any change in his pain pattern. No fever. 3 episodes of vomiting since yesterday. He endorses nausea and pain. Pain was controlled at home with oxycodone which is out of. Denies any urinary symptoms. No blood in emesis or blood in stool. No chest pain or shortness of breath. No fever.  The history is provided by the patient.    Past Medical History  Diagnosis Date  . Hypertension   . Asthma   . Pancreatitis   . Cocaine abuse   . Depression   . H/O suicide attempt   . Heart murmur     "when he was little" (03/06/2013)  . Shortness of breath     "can happen at anytime" (03/06/2013)  . Anemia   . H/O hiatal hernia   . GERD (gastroesophageal reflux disease)   . Arthritis     "knees" (03/06/2013)  . Anxiety   . WPW (Wolff-Parkinson-White syndrome)     [name] 03/06/2013   Past Surgical History  Procedure Laterality Date  . Facial fracture surgery Left 1990's    "result of trauma" (03/06/2013)  . Eye surgery Left 1990's    "result of trauma" (03/06/2013)   Family History  Problem Relation Age of Onset  . Hypertension Other   . Coronary artery disease Other    History  Substance Use Topics  . Smoking status: Current Every Day Smoker -- 1.50 packs/day for 30 years    Types: Cigarettes  . Smokeless tobacco: Current User  . Alcohol Use: Yes     Comment: 12 pk beer daily 12oz  cans liquor occasionally    Review of Systems  Constitutional: Positive for activity change, appetite change and fatigue. Negative for fever.  HENT: Negative for congestion and rhinorrhea.   Respiratory: Negative for cough, chest tightness and shortness of breath.   Cardiovascular: Negative for chest pain.  Gastrointestinal: Positive for nausea, vomiting, abdominal pain and diarrhea.  Musculoskeletal: Negative for arthralgias, back pain, myalgias, neck pain and neck stiffness.  Skin: Negative for rash.  Neurological: Negative for dizziness, weakness and headaches.  A complete 10 system review of systems was obtained and all systems are negative except as noted in the HPI and PMH.      Allergies  Trazodone and nefazodone and Shellfish-derived products  Home Medications   Prior to Admission medications   Medication Sig Start Date End Date Taking? Authorizing Provider  albuterol (PROVENTIL HFA;VENTOLIN HFA) 108 (90 BASE) MCG/ACT inhaler Inhale 1-2 puffs into the lungs every 6 (six) hours as needed for wheezing or shortness of breath. 11/26/13  Yes Shanker Kristeen Mans, MD  Alum & Mag Hydroxide-Simeth (GI COCKTAIL) SUSP suspension Take 30 mLs by mouth 3 (three) times daily as needed for indigestion. Shake well. 11/28/13  Yes Angelica Chessman, MD  feeding supplement, ENSURE COMPLETE, (ENSURE COMPLETE) LIQD Take 237 mLs by mouth 2 (two) times daily between meals. 11/26/13  Yes Shanker Kristeen Mans, MD  folic acid (FOLVITE) 1 MG tablet Take 1 tablet (1 mg total) by mouth daily. 11/26/13  Yes Shanker Kristeen Mans, MD  ibuprofen (ADVIL,MOTRIN) 800 MG tablet Take 800 mg by mouth every 8 (eight) hours as needed for moderate pain.   Yes Historical Provider, MD  lipase/protease/amylase (CREON-12/PANCREASE) 12000 UNITS CPEP capsule Take 1 capsule by mouth 3 (three) times daily before meals. 11/28/13  Yes Angelica Chessman, MD  LORazepam (ATIVAN) 1 MG tablet Take 1 tablet (1 mg total) by mouth every 8 (eight)  hours as needed for anxiety. 11/28/13  Yes Angelica Chessman, MD  metoprolol (LOPRESSOR) 50 MG tablet Take 1 tablet (50 mg total) by mouth 2 (two) times daily. For high blood pressure 11/28/13  Yes Angelica Chessman, MD  Multiple Vitamin (MULTIVITAMIN WITH MINERALS) TABS tablet Take 1 tablet by mouth daily. For low vitamin 11/28/13  Yes Angelica Chessman, MD  ondansetron (ZOFRAN) 4 MG tablet Take 1 tablet (4 mg total) by mouth every 6 (six) hours as needed for nausea or vomiting. 11/28/13  Yes Angelica Chessman, MD  pantoprazole (PROTONIX) 40 MG tablet Take 1 tablet (40 mg total) by mouth 2 (two) times daily. 11/28/13  Yes Angelica Chessman, MD  QUEtiapine (SEROQUEL) 100 MG tablet Take 100 mg by mouth at bedtime. For mood control 11/28/13  Yes Angelica Chessman, MD  sertraline (ZOLOFT) 25 MG tablet Take 3 tablets (75 mg total) by mouth daily. For depression 11/28/13  Yes Angelica Chessman, MD  thiamine 100 MG tablet Take 1 tablet (100 mg total) by mouth daily. 11/28/13  Yes Angelica Chessman, MD  ondansetron (ZOFRAN) 4 MG tablet Take 1 tablet (4 mg total) by mouth every 6 (six) hours. 12/09/13   Ezequiel Essex, MD  oxyCODONE-acetaminophen (PERCOCET/ROXICET) 5-325 MG per tablet Take 2 tablets by mouth every 4 (four) hours as needed for severe pain. 12/09/13   Ezequiel Essex, MD   BP 161/80  Pulse 83  Temp(Src) 98.6 F (37 C) (Oral)  Resp 20  Ht 5\' 9"  (1.753 m)  Wt 137 lb (62.143 kg)  BMI 20.22 kg/m2  SpO2 100% Physical Exam  Nursing note and vitals reviewed. Constitutional: He is oriented to person, place, and time. He appears well-developed and well-nourished. No distress.  HENT:  Head: Normocephalic and atraumatic.  Mouth/Throat: Oropharynx is clear and moist. No oropharyngeal exudate.  Moist mucus membranes  Eyes: Conjunctivae and EOM are normal. Pupils are equal, round, and reactive to light.  Neck: Normal range of motion. Neck supple.  No meningismus.  Cardiovascular: Normal rate, regular  rhythm, normal heart sounds and intact distal pulses.   No murmur heard. Pulmonary/Chest: Effort normal and breath sounds normal. No respiratory distress.  Abdominal: Soft. There is tenderness. There is no rebound and no guarding.  TTP epigastrium and LUQ  Musculoskeletal: Normal range of motion. He exhibits no edema and no tenderness.  Neurological: He is alert and oriented to person, place, and time. No cranial nerve deficit. He exhibits normal muscle tone. Coordination normal.  No ataxia on finger to nose bilaterally. No pronator drift. 5/5 strength throughout. CN 2-12 intact. Negative Romberg. Equal grip strength. Sensation intact. Gait is normal.   Skin: Skin is warm.  Psychiatric: He has a normal mood and affect. His behavior is normal.    ED Course  Procedures (including critical care time) Labs Review Labs Reviewed  CBC WITH DIFFERENTIAL - Abnormal; Notable for the following:    RBC 3.89 (*)    Hemoglobin 12.1 (*)    HCT 35.5 (*)  RDW 16.5 (*)    All other components within normal limits  COMPREHENSIVE METABOLIC PANEL - Abnormal; Notable for the following:    Potassium 3.4 (*)    BUN 4 (*)    Total Protein 8.5 (*)    AST 68 (*)    Anion gap 18 (*)    All other components within normal limits  LIPASE, BLOOD - Abnormal; Notable for the following:    Lipase 180 (*)    All other components within normal limits  URINALYSIS, ROUTINE W REFLEX MICROSCOPIC - Abnormal; Notable for the following:    APPearance CLOUDY (*)    All other components within normal limits  TROPONIN I  ETHANOL    Imaging Review Dg Abd Acute W/chest  12/09/2013   CLINICAL DATA:  Pancreatitis; diarrhea  EXAM: ACUTE ABDOMEN SERIES (ABDOMEN 2 VIEW & CHEST 1 VIEW)  COMPARISON:  Chest radiograph October 17, 2013; abdominal radiograph Oct 06, 2013  FINDINGS: Chest radiograph: There is no edema or consolidation. Heart size and pulmonary vascularity are normal. No adenopathy.  Supine and upright abdomen: Bowel gas  pattern is unremarkable. No obstruction or free air. There is moderate stool in the colon. There is a phlebolith in the right pelvis. There is a healed fracture of the posterior right tenth rib.  IMPRESSION: Unremarkable bowel gas pattern.  No lung edema or consolidation.   Electronically Signed   By: Lowella Grip M.D.   On: 12/09/2013 21:16     EKG Interpretation   Date/Time:  Monday December 09 2013 19:46:30 EDT Ventricular Rate:  89 PR Interval:  111 QRS Duration: 76 QT Interval:  370 QTC Calculation: 450 R Axis:   73 Text Interpretation:  Sinus rhythm Borderline short PR interval Left  atrial enlargement Borderline repolarization abnormality No significant  change was found Confirmed by [place] [phone]) on 12/09/2013  8:08:40 PM      MDM   Final diagnoses:  Acute pancreatitis, unspecified pancreatitis type   Abdominal pain with diarrhea, nausea and vomiting similar to previous episodes of pancreatitis. Admits to alcohol use. Abdomen without peritoneal signs.  Labs at baseline. Lipase 180 which is near his baseline  No vomiting or diarrhea in the ED. Pain able to be controlled with IV medications as well as ketamine. Abdomen soft without peritoneal signs.  Patient tolerating by mouth. No indication for admission. Will be discharged on small prescription of pain medications and antibiotics. Instructed to avoid alcohol completely. Return precautions discussed.  BP 161/80  Pulse 83  Temp(Src) 98.6 F (37 C) (Oral)  Resp 20  Ht 5\' 9"  (1.753 m)  Wt 137 lb (62.143 kg)  BMI 20.22 kg/m2  SpO2 100%   Ezequiel Essex, MD 12/09/13 2322

## 2013-12-09 NOTE — ED Notes (Signed)
Pt here for pancreatitis and diarrhea, pt admits to etoh use last night, recent hospitalization for same.

## 2013-12-11 ENCOUNTER — Encounter (HOSPITAL_COMMUNITY): Payer: Self-pay | Admitting: Emergency Medicine

## 2013-12-11 ENCOUNTER — Emergency Department (HOSPITAL_COMMUNITY)
Admission: EM | Admit: 2013-12-11 | Discharge: 2013-12-11 | Disposition: A | Discharge status: Home / Self Care | Payer: Self-pay | Attending: Emergency Medicine | Admitting: Emergency Medicine

## 2013-12-11 DIAGNOSIS — R011 Cardiac murmur, unspecified: Secondary | ICD-10-CM | POA: Insufficient documentation

## 2013-12-11 DIAGNOSIS — K859 Acute pancreatitis without necrosis or infection, unspecified: Secondary | ICD-10-CM | POA: Insufficient documentation

## 2013-12-11 DIAGNOSIS — I10 Essential (primary) hypertension: Secondary | ICD-10-CM | POA: Insufficient documentation

## 2013-12-11 DIAGNOSIS — F329 Major depressive disorder, single episode, unspecified: Secondary | ICD-10-CM | POA: Insufficient documentation

## 2013-12-11 DIAGNOSIS — D649 Anemia, unspecified: Secondary | ICD-10-CM | POA: Insufficient documentation

## 2013-12-11 DIAGNOSIS — F3289 Other specified depressive episodes: Secondary | ICD-10-CM | POA: Insufficient documentation

## 2013-12-11 DIAGNOSIS — Z8739 Personal history of other diseases of the musculoskeletal system and connective tissue: Secondary | ICD-10-CM | POA: Insufficient documentation

## 2013-12-11 DIAGNOSIS — K219 Gastro-esophageal reflux disease without esophagitis: Secondary | ICD-10-CM | POA: Insufficient documentation

## 2013-12-11 DIAGNOSIS — J45909 Unspecified asthma, uncomplicated: Secondary | ICD-10-CM | POA: Insufficient documentation

## 2013-12-11 DIAGNOSIS — F1410 Cocaine abuse, uncomplicated: Secondary | ICD-10-CM | POA: Insufficient documentation

## 2013-12-11 DIAGNOSIS — R1013 Epigastric pain: Secondary | ICD-10-CM | POA: Insufficient documentation

## 2013-12-11 DIAGNOSIS — Z791 Long term (current) use of non-steroidal anti-inflammatories (NSAID): Secondary | ICD-10-CM | POA: Insufficient documentation

## 2013-12-11 DIAGNOSIS — F17200 Nicotine dependence, unspecified, uncomplicated: Secondary | ICD-10-CM | POA: Insufficient documentation

## 2013-12-11 DIAGNOSIS — F411 Generalized anxiety disorder: Secondary | ICD-10-CM | POA: Insufficient documentation

## 2013-12-11 DIAGNOSIS — Z79899 Other long term (current) drug therapy: Secondary | ICD-10-CM | POA: Insufficient documentation

## 2013-12-11 LAB — CBC WITH DIFFERENTIAL/PLATELET
Basophils Absolute: 0 10*3/uL (ref 0.0–0.1)
Basophils Relative: 0 % (ref 0–1)
EOS ABS: 0.6 10*3/uL (ref 0.0–0.7)
EOS PCT: 6 % — AB (ref 0–5)
HEMATOCRIT: 34.6 % — AB (ref 39.0–52.0)
Hemoglobin: 11.2 g/dL — ABNORMAL LOW (ref 13.0–17.0)
LYMPHS ABS: 3.4 10*3/uL (ref 0.7–4.0)
Lymphocytes Relative: 35 % (ref 12–46)
MCH: 30.9 pg (ref 26.0–34.0)
MCHC: 32.4 g/dL (ref 30.0–36.0)
MCV: 95.3 fL (ref 78.0–100.0)
MONO ABS: 0.6 10*3/uL (ref 0.1–1.0)
MONOS PCT: 7 % (ref 3–12)
Neutro Abs: 5.1 10*3/uL (ref 1.7–7.7)
Neutrophils Relative %: 52 % (ref 43–77)
Platelets: 216 10*3/uL (ref 150–400)
RBC: 3.63 MIL/uL — AB (ref 4.22–5.81)
RDW: 16.2 % — ABNORMAL HIGH (ref 11.5–15.5)
WBC: 9.7 10*3/uL (ref 4.0–10.5)

## 2013-12-11 LAB — COMPREHENSIVE METABOLIC PANEL
ALT: 44 U/L (ref 0–53)
AST: 82 U/L — ABNORMAL HIGH (ref 0–37)
Albumin: 3.5 g/dL (ref 3.5–5.2)
Alkaline Phosphatase: 104 U/L (ref 39–117)
Anion gap: 15 (ref 5–15)
BUN: 4 mg/dL — AB (ref 6–23)
CALCIUM: 8.2 mg/dL — AB (ref 8.4–10.5)
CO2: 20 meq/L (ref 19–32)
CREATININE: 0.65 mg/dL (ref 0.50–1.35)
Chloride: 107 mEq/L (ref 96–112)
GFR calc Af Amer: >90 mL/min (ref >90)
GLUCOSE: 87 mg/dL (ref 70–99)
Potassium: 4 mEq/L (ref 3.7–5.3)
Sodium: 142 mEq/L (ref 137–147)
Total Bilirubin: 0.3 mg/dL (ref 0.3–1.2)
Total Protein: 7.3 g/dL (ref 6.0–8.3)

## 2013-12-11 LAB — URINALYSIS, ROUTINE W REFLEX MICROSCOPIC
BILIRUBIN URINE: NEGATIVE
GLUCOSE, UA: NEGATIVE mg/dL
HGB URINE DIPSTICK: NEGATIVE
Ketones, ur: NEGATIVE mg/dL
Leukocytes, UA: NEGATIVE
Nitrite: NEGATIVE
Protein, ur: NEGATIVE mg/dL
SPECIFIC GRAVITY, URINE: 1.012 (ref 1.005–1.030)
Urobilinogen, UA: 0.2 mg/dL (ref 0.0–1.0)
pH: 5 (ref 5.0–8.0)

## 2013-12-11 LAB — LIPASE, BLOOD: LIPASE: 248 U/L — AB (ref 11–59)

## 2013-12-11 LAB — ETHANOL: Alcohol, Ethyl (B): <11 mg/dL (ref 0–11)

## 2013-12-11 MED ORDER — GI COCKTAIL ~~LOC~~
30.0000 mL | Freq: Once | ORAL | Status: AC
  Administered 2013-12-11: 30 mL via ORAL
  Filled 2013-12-11: qty 30

## 2013-12-11 MED ORDER — ONDANSETRON HCL 4 MG/2ML IJ SOLN
4.0000 mg | Freq: Once | INTRAMUSCULAR | Status: AC
  Administered 2013-12-11: 4 mg via INTRAVENOUS
  Filled 2013-12-11: qty 2

## 2013-12-11 MED ORDER — OXYCODONE-ACETAMINOPHEN 5-325 MG PO TABS
2.0000 | ORAL_TABLET | Freq: Once | ORAL | Status: AC
  Administered 2013-12-11: 2 via ORAL
  Filled 2013-12-11: qty 2

## 2013-12-11 MED ORDER — HYDROMORPHONE HCL PF 1 MG/ML IJ SOLN
2.0000 mg | Freq: Once | INTRAMUSCULAR | Status: AC
  Administered 2013-12-11: 2 mg via INTRAVENOUS

## 2013-12-11 MED ORDER — SODIUM CHLORIDE 0.9 % IV BOLUS (SEPSIS)
1000.0000 mL | Freq: Once | INTRAVENOUS | Status: AC
  Administered 2013-12-11: 1000 mL via INTRAVENOUS

## 2013-12-11 MED ORDER — PROMETHAZINE HCL 25 MG PO TABS: 25.0000 mg | ORAL_TABLET | Freq: Four times a day (QID) | ORAL | Status: DC | PRN

## 2013-12-11 MED ORDER — OXYCODONE-ACETAMINOPHEN 5-325 MG PO TABS: 2.0000 | ORAL_TABLET | ORAL | Status: DC | PRN

## 2013-12-11 MED ORDER — RANITIDINE HCL 150 MG PO TABS: 150.0000 mg | ORAL_TABLET | Freq: Two times a day (BID) | ORAL | Status: DC

## 2013-12-11 NOTE — ED Notes (Signed)
Pt brought to ED by [facility] from home c/o 9/10 abd pain, pt has Hx of Pancreatitis, was here on Sunday 12/08/2013 for the same complain. Pt states he is been having n/v/d since last night at 2300.  Pt states he took some Percocet and Zofran at home with no relief. Pt denies been drinking since Sunday.

## 2013-12-11 NOTE — Discharge Instructions (Signed)
Acute Pancreatitis Acute pancreatitis is a disease in which the pancreas becomes suddenly irritated (inflamed). The pancreas is a large gland behind your stomach. The pancreas makes enzymes that help digest food. The pancreas also makes 2 hormones that help control your blood sugar. Acute pancreatitis happens when the enzymes attack and damage the pancreas. Most attacks last a couple of days and can cause serious problems. HOME CARE  Follow your doctor's diet instructions. You may need to avoid alcohol and limit fat in your diet.  Eat small meals often.  Drink enough fluids to keep your pee (urine) clear or pale yellow.  Only take medicines as told by your doctor.  Avoid drinking alcohol if it caused your disease.  Do not smoke.  Get plenty of rest.  Check your blood sugar at home as told by your doctor.  Keep all doctor visits as told. GET HELP IF:  You do not get better as quickly as expected.  You have new or worsening symptoms.  You have lasting pain, weakness, or feel sick to your stomach (nauseous).  You get better and then have another pain attack. GET HELP RIGHT AWAY IF:   You are unable to eat or keep fluids down.  Your pain becomes severe.  You have a fever or lasting symptoms for more than 2 to 3 days.  You have a fever and your symptoms suddenly get worse.  Your skin or the white part of your eyes turn yellow (jaundice).  You throw up (vomit).  You feel dizzy, or you pass out (faint).  Your blood sugar is high (over 300 mg/dL). MAKE SURE YOU:   Understand these instructions.  Will watch your condition.  Will get help right away if you are not doing well or get worse. Document Released: 10/12/2007 Document Revised: 09/09/2013 Document Reviewed: 08/04/2011 [facility] Patient Information 2015 [place], [place]. This information is not intended to replace advice given to you by your health care provider. Make sure you discuss any questions you have with your  health care provider.   If symptoms get worse, return to ED for further evaluation.

## 2013-12-11 NOTE — ED Provider Notes (Signed)
CSN: [id number]     Arrival date & time 12/11/13  0445 History   First MD Initiated Contact with Patient 12/11/13 [phone]     Chief Complaint  Patient presents with  . Abdominal Pain     (Consider location/radiation/quality/duration/timing/severity/associated sxs/prior Treatment) HPI [name] is a 43 y.o. male with a hx of chronic pancreatitis who complains of an acute episode of pancreatitis. He said typically his flares have been due to alcohol consumption, but this time he says he "ate a bunch of chicken stew and that's what did it". He says this pain is exactly like his previous pancreatitis flares. He says the pain came up this morning around 3AM and the Percocet he was discharged with on Monday are not working anymore. He reports N/V, Diarrhea. No constipation or fevers.      Past Medical History  Diagnosis Date  . Hypertension   . Asthma   . Pancreatitis   . Cocaine abuse   . Depression   . H/O suicide attempt   . Heart murmur     "when he was little" (03/06/2013)  . Shortness of breath     "can happen at anytime" (03/06/2013)  . Anemia   . H/O hiatal hernia   . GERD (gastroesophageal reflux disease)   . Arthritis     "knees" (03/06/2013)  . Anxiety   . WPW (Wolff-Parkinson-White syndrome)     [name] 03/06/2013   Past Surgical History  Procedure Laterality Date  . Facial fracture surgery Left 1990's    "result of trauma" (03/06/2013)  . Eye surgery Left 1990's    "result of trauma" (03/06/2013)   Family History  Problem Relation Age of Onset  . Hypertension Other   . Coronary artery disease Other    History  Substance Use Topics  . Smoking status: Current Every Day Smoker -- 1.50 packs/day for 30 years    Types: Cigarettes  . Smokeless tobacco: Current User  . Alcohol Use: Yes     Comment: 12 pk beer daily 12oz cans liquor occasionally    Review of Systems  Constitutional: Negative for fever.  HENT: Negative for sore throat.   Eyes: Negative for  visual disturbance.  Respiratory: Negative for shortness of breath.   Cardiovascular: Negative for chest pain.  Gastrointestinal: Positive for nausea and abdominal pain.  Endocrine: Negative for polyuria.  Genitourinary: Negative for dysuria.  Skin: Negative for rash.  Neurological: Negative for headaches.  All other systems reviewed and are negative.     Allergies  Trazodone and nefazodone and Shellfish-derived products  Home Medications   Prior to Admission medications   Medication Sig Start Date End Date Taking? Authorizing Provider  albuterol (PROVENTIL HFA;VENTOLIN HFA) 108 (90 BASE) MCG/ACT inhaler Inhale 1-2 puffs into the lungs every 6 (six) hours as needed for wheezing or shortness of breath. 11/26/13  Yes Shanker Kristeen Mans, MD  Alum & Mag Hydroxide-Simeth (GI COCKTAIL) SUSP suspension Take 30 mLs by mouth 3 (three) times daily as needed for indigestion. Shake well. 11/28/13  Yes Angelica Chessman, MD  feeding supplement, ENSURE COMPLETE, (ENSURE COMPLETE) LIQD Take 237 mLs by mouth 2 (two) times daily between meals. 11/26/13  Yes Shanker Kristeen Mans, MD  folic acid (FOLVITE) 1 MG tablet Take 1 tablet (1 mg total) by mouth daily. 11/26/13  Yes Shanker Kristeen Mans, MD  ibuprofen (ADVIL,MOTRIN) 800 MG tablet Take 800 mg by mouth every 8 (eight) hours as needed for moderate pain.   Yes Historical Provider, MD  lipase/protease/amylase (CREON-12/PANCREASE) 12000 UNITS CPEP capsule Take 1 capsule by mouth 3 (three) times daily before meals. 11/28/13  Yes Angelica Chessman, MD  LORazepam (ATIVAN) 1 MG tablet Take 1 tablet (1 mg total) by mouth every 8 (eight) hours as needed for anxiety. 11/28/13  Yes Angelica Chessman, MD  metoprolol (LOPRESSOR) 50 MG tablet Take 1 tablet (50 mg total) by mouth 2 (two) times daily. For high blood pressure 11/28/13  Yes Angelica Chessman, MD  Multiple Vitamin (MULTIVITAMIN WITH MINERALS) TABS tablet Take 1 tablet by mouth daily. For low vitamin 11/28/13  Yes  Angelica Chessman, MD  ondansetron (ZOFRAN) 4 MG tablet Take 1 tablet (4 mg total) by mouth every 6 (six) hours. 12/09/13  Yes Ezequiel Essex, MD  pantoprazole (PROTONIX) 40 MG tablet Take 1 tablet (40 mg total) by mouth 2 (two) times daily. 11/28/13  Yes Angelica Chessman, MD  QUEtiapine (SEROQUEL) 100 MG tablet Take 100 mg by mouth at bedtime. For mood control 11/28/13  Yes Angelica Chessman, MD  sertraline (ZOLOFT) 25 MG tablet Take 3 tablets (75 mg total) by mouth daily. For depression 11/28/13  Yes Angelica Chessman, MD  thiamine 100 MG tablet Take 1 tablet (100 mg total) by mouth daily. 11/28/13  Yes Angelica Chessman, MD  oxyCODONE-acetaminophen (PERCOCET) 5-325 MG per tablet Take 2 tablets by mouth every 4 (four) hours as needed. 12/11/13   Verl Dicker, PA-C  promethazine (PHENERGAN) 25 MG tablet Take 1 tablet (25 mg total) by mouth every 6 (six) hours as needed for nausea or vomiting. 12/11/13   Viona Gilmore Kieryn Burtis, PA-C  ranitidine (ZANTAC) 150 MG tablet Take 1 tablet (150 mg total) by mouth 2 (two) times daily. 12/11/13   Viona Gilmore Nimisha Rathel, PA-C   BP 154/104  Pulse 95  Temp(Src) 98.2 F (36.8 C) (Oral)  Resp 20  Ht 5\' 9"  (1.753 m)  Wt 137 lb (62.143 kg)  BMI 20.22 kg/m2  SpO2 98% Physical Exam  Constitutional: He is oriented to person, place, and time. He appears well-developed and well-nourished.  HENT:  Head: Normocephalic and atraumatic.  Mouth/Throat: Oropharynx is clear and moist.  Eyes: Conjunctivae are normal. Pupils are equal, round, and reactive to light. Right eye exhibits no discharge. Left eye exhibits no discharge.  Neck: Neck supple.  Cardiovascular: Normal rate, regular rhythm and normal heart sounds.   Pulmonary/Chest: Effort normal and breath sounds normal. No respiratory distress. He has no wheezes. He has no rales.  Abdominal: Soft. There is tenderness.  Extensive tenderness in epigastric area. No rigidity, no peritoneal signs. Hyperactive bowel sounds   Musculoskeletal: He exhibits no tenderness.  Neurological: He is alert and oriented to person, place, and time.  Cranial Nerves II-XII grossly intact  Skin: Skin is warm and dry. No rash noted.  Psychiatric: He has a normal mood and affect.    ED Course  Procedures (including critical care time) Labs Review Labs Reviewed  CBC WITH DIFFERENTIAL - Abnormal; Notable for the following:    RBC 3.63 (*)    Hemoglobin 11.2 (*)    HCT 34.6 (*)    RDW 16.2 (*)    Eosinophils Relative 6 (*)    All other components within normal limits  COMPREHENSIVE METABOLIC PANEL - Abnormal; Notable for the following:    BUN 4 (*)    Calcium 8.2 (*)    AST 82 (*)    All other components within normal limits  LIPASE, BLOOD - Abnormal; Notable for the following:    Lipase  248 (*)    All other components within normal limits  ETHANOL  URINALYSIS, ROUTINE W REFLEX MICROSCOPIC    Imaging Review Dg Abd Acute W/chest  12/09/2013   CLINICAL DATA:  Pancreatitis; diarrhea  EXAM: ACUTE ABDOMEN SERIES (ABDOMEN 2 VIEW & CHEST 1 VIEW)  COMPARISON:  Chest radiograph October 17, 2013; abdominal radiograph Oct 06, 2013  FINDINGS: Chest radiograph: There is no edema or consolidation. Heart size and pulmonary vascularity are normal. No adenopathy.  Supine and upright abdomen: Bowel gas pattern is unremarkable. No obstruction or free air. There is moderate stool in the colon. There is a phlebolith in the right pelvis. There is a healed fracture of the posterior right tenth rib.  IMPRESSION: Unremarkable bowel gas pattern.  No lung edema or consolidation.   Electronically Signed   By: Lowella Grip M.D.   On: 12/09/2013 21:16     EKG Interpretation   Date/Time:  Wednesday December 11 2013 04:55:59 EDT Ventricular Rate:  98 PR Interval:  102 QRS Duration: 72 QT Interval:  419 QTC Calculation: 535 R Axis:   72 Text Interpretation:  Normal sinus rhythm Short PR interval LAE, consider  biatrial enlargement Nonspecific T  abnrm, anterolateral leads Prolonged QT  interval since last tracing no significant change Confirmed by MILLER  MD,  BRIAN ([id number]) on 12/11/2013 5:38:25 AM      Patient is not febrile, Vitals stable in ED, EKG unremarkable. PE yields mild epigastric tenderness, but no rigidity or any other evidence to suggest a surgical abdomen. Labwork essentially normal with no appreciable white count. PT was bolused with fluids, plan for pain management and dietary reform was made and patient was amenable to this plan. MDM   Symptoms and presentation consistent with an acute exacerbation of chronic pancreatitis. Labwork consistent with acute on chronic pancreatitis Pt responded well to GI cocktail. Pt does seem to have some aberrant drug behavior, but he will be treated and pain management will be executed with tact. DC with #6 Percocet, Phenergan as less expensive antiemetic, and ranitidine for GI sx Final diagnoses:  Acute pancreatitis, unspecified pancreatitis type   Meds given in ED:  Medications  sodium chloride 0.9 % bolus 1,000 mL (0 mLs Intravenous Stopped 12/11/13 0706)  ondansetron (ZOFRAN) injection 4 mg (4 mg Intravenous Given 12/11/13 0553)  gi cocktail (Maalox,Lidocaine,Donnatal) (30 mLs Oral Given 12/11/13 0553)  HYDROmorphone (DILAUDID) injection 2 mg (2 mg Intravenous Given 12/11/13 0553)  oxyCODONE-acetaminophen (PERCOCET/ROXICET) 5-325 MG per tablet 2 tablet (2 tablets Oral Given 12/11/13 0705)    Discharge Medication List as of 12/11/2013  7:23 AM    START taking these medications   Details  oxyCODONE-acetaminophen (PERCOCET) 5-325 MG per tablet Take 2 tablets by mouth every 4 (four) hours as needed., Starting 12/11/2013, Until Discontinued, Print    promethazine (PHENERGAN) 25 MG tablet Take 1 tablet (25 mg total) by mouth every 6 (six) hours as needed for nausea or vomiting., Starting 12/11/2013, Until Discontinued, Print    ranitidine (ZANTAC) 150 MG tablet Take 1 tablet (150 mg total) by  mouth 2 (two) times daily., Starting 12/11/2013, Until Discontinued, [place], [place] 12/12/13 [phone]

## 2013-12-11 NOTE — ED Provider Notes (Signed)
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 The pain is located in his epigastrium, he has associated nausea and diarrhea, has been able to eat multiple different foods since his discharge from the emergency department in the last 2-3 days, states that he went to a cookout and was eating foods at a cookout including a chicken stew. He states that his pain came back at approximately 3:00 AM and has been persistent, similar to prior pancreatitis, not improved with Percocet at home. He was given 15 Percocet on discharge several days ago  On exam the patient has a soft abdomen with moderate tenderness in the epigastrium, minimal right upper and left upper tenderness and no lower abdominal tenderness. No peritoneal signs, increased bowel sounds, no guarding other than the epigastrium. Heart and lungs are clear, pulses 98 on my exam, no murmurs, no respiratory distress, moist mucous membranes.  Patient likely has ongoing chronic pancreatitis. Labs ordered to compare to prior data, patient does have some drug-seeking behavior in the past with multiple pain complaints. However the gravity of an illness such as pancreatitis warrants pain control in the emergency department  He stabilized with pain control, did well with PO percocet as PO trial, amenable to d/c, counseled on ETOH use and dietary dysfunciton  Medical screening examination/treatment/procedure(s) were conducted as a shared visit with non-physician practitioner(s) and myself.  I personally evaluated the patient during the encounter.  Clinical Impression: Chronic pancreatitis      Johnna Acosta, MD 12/12/13 2012

## 2013-12-12 NOTE — ED Provider Notes (Signed)
Medical screening examination/treatment/procedure(s) were conducted as a shared visit with non-physician practitioner(s) and myself.  I personally evaluated the patient during the encounter  Please see my separate respective documentation pertaining to this patient encounter   Johnna Acosta, MD 12/12/13 2011

## 2013-12-16 ENCOUNTER — Emergency Department (HOSPITAL_COMMUNITY)
Admission: EM | Admit: 2013-12-16 | Discharge: 2013-12-17 | Disposition: A | Discharge status: Home / Self Care | Payer: Self-pay | Attending: Emergency Medicine | Admitting: Emergency Medicine

## 2013-12-16 ENCOUNTER — Encounter (HOSPITAL_COMMUNITY): Payer: Self-pay | Admitting: Emergency Medicine

## 2013-12-16 DIAGNOSIS — M1710 Unilateral primary osteoarthritis, unspecified knee: Secondary | ICD-10-CM | POA: Insufficient documentation

## 2013-12-16 DIAGNOSIS — F17200 Nicotine dependence, unspecified, uncomplicated: Secondary | ICD-10-CM | POA: Insufficient documentation

## 2013-12-16 DIAGNOSIS — R109 Unspecified abdominal pain: Secondary | ICD-10-CM | POA: Insufficient documentation

## 2013-12-16 DIAGNOSIS — F329 Major depressive disorder, single episode, unspecified: Secondary | ICD-10-CM | POA: Insufficient documentation

## 2013-12-16 DIAGNOSIS — R011 Cardiac murmur, unspecified: Secondary | ICD-10-CM | POA: Insufficient documentation

## 2013-12-16 DIAGNOSIS — F3289 Other specified depressive episodes: Secondary | ICD-10-CM | POA: Insufficient documentation

## 2013-12-16 DIAGNOSIS — K219 Gastro-esophageal reflux disease without esophagitis: Secondary | ICD-10-CM | POA: Insufficient documentation

## 2013-12-16 DIAGNOSIS — F411 Generalized anxiety disorder: Secondary | ICD-10-CM | POA: Insufficient documentation

## 2013-12-16 DIAGNOSIS — F1010 Alcohol abuse, uncomplicated: Secondary | ICD-10-CM | POA: Insufficient documentation

## 2013-12-16 DIAGNOSIS — Z862 Personal history of diseases of the blood and blood-forming organs and certain disorders involving the immune mechanism: Secondary | ICD-10-CM | POA: Insufficient documentation

## 2013-12-16 DIAGNOSIS — IMO0002 Reserved for concepts with insufficient information to code with codable children: Secondary | ICD-10-CM

## 2013-12-16 DIAGNOSIS — I10 Essential (primary) hypertension: Secondary | ICD-10-CM | POA: Insufficient documentation

## 2013-12-16 DIAGNOSIS — K860 Alcohol-induced chronic pancreatitis: Secondary | ICD-10-CM

## 2013-12-16 DIAGNOSIS — R1013 Epigastric pain: Secondary | ICD-10-CM

## 2013-12-16 DIAGNOSIS — K859 Acute pancreatitis without necrosis or infection, unspecified: Secondary | ICD-10-CM | POA: Insufficient documentation

## 2013-12-16 DIAGNOSIS — Z79899 Other long term (current) drug therapy: Secondary | ICD-10-CM | POA: Insufficient documentation

## 2013-12-16 DIAGNOSIS — J45909 Unspecified asthma, uncomplicated: Secondary | ICD-10-CM | POA: Insufficient documentation

## 2013-12-16 LAB — CBC WITH DIFFERENTIAL/PLATELET
Basophils Absolute: 0 10*3/uL (ref 0.0–0.1)
Basophils Relative: 0 % (ref 0–1)
Eosinophils Absolute: 0.3 10*3/uL (ref 0.0–0.7)
Eosinophils Relative: 4 % (ref 0–5)
HCT: 35 % — ABNORMAL LOW (ref 39.0–52.0)
Hemoglobin: 11.6 g/dL — ABNORMAL LOW (ref 13.0–17.0)
Lymphocytes Relative: 43 % (ref 12–46)
Lymphs Abs: 3.1 10*3/uL (ref 0.7–4.0)
MCH: 30.7 pg (ref 26.0–34.0)
MCHC: 33.1 g/dL (ref 30.0–36.0)
MCV: 92.6 fL (ref 78.0–100.0)
Monocytes Absolute: 0.6 10*3/uL (ref 0.1–1.0)
Monocytes Relative: 8 % (ref 3–12)
Neutro Abs: 3.3 10*3/uL (ref 1.7–7.7)
Neutrophils Relative %: 45 % (ref 43–77)
Platelets: 223 10*3/uL (ref 150–400)
RBC: 3.78 MIL/uL — ABNORMAL LOW (ref 4.22–5.81)
RDW: 16.7 % — ABNORMAL HIGH (ref 11.5–15.5)
WBC: 7.3 10*3/uL (ref 4.0–10.5)

## 2013-12-16 LAB — COMPREHENSIVE METABOLIC PANEL
ALT: 41 U/L (ref 0–53)
AST: 58 U/L — ABNORMAL HIGH (ref 0–37)
Albumin: 3.4 g/dL — ABNORMAL LOW (ref 3.5–5.2)
Alkaline Phosphatase: 117 U/L (ref 39–117)
Anion gap: 12 (ref 5–15)
BUN: 8 mg/dL (ref 6–23)
CO2: 26 mEq/L (ref 19–32)
Calcium: 9.5 mg/dL (ref 8.4–10.5)
Chloride: 104 mEq/L (ref 96–112)
Creatinine, Ser: 0.87 mg/dL (ref 0.50–1.35)
GFR calc Af Amer: >90 mL/min (ref >90)
GFR calc non Af Amer: >90 mL/min (ref >90)
Glucose, Bld: 88 mg/dL (ref 70–99)
Potassium: 4.2 mEq/L (ref 3.7–5.3)
Sodium: 142 mEq/L (ref 137–147)
Total Bilirubin: 0.2 mg/dL — ABNORMAL LOW (ref 0.3–1.2)
Total Protein: 8.2 g/dL (ref 6.0–8.3)

## 2013-12-16 LAB — URINALYSIS, ROUTINE W REFLEX MICROSCOPIC
Bilirubin Urine: NEGATIVE
Glucose, UA: NEGATIVE mg/dL
Hgb urine dipstick: NEGATIVE
Ketones, ur: NEGATIVE mg/dL
Leukocytes, UA: NEGATIVE
Nitrite: NEGATIVE
Protein, ur: NEGATIVE mg/dL
Specific Gravity, Urine: 1.016 (ref 1.005–1.030)
Urobilinogen, UA: 0.2 mg/dL (ref 0.0–1.0)
pH: 6.5 (ref 5.0–8.0)

## 2013-12-16 LAB — LIPASE, BLOOD: Lipase: 158 U/L — ABNORMAL HIGH (ref 11–59)

## 2013-12-16 MED ORDER — HYDROMORPHONE HCL PF 1 MG/ML IJ SOLN
1.0000 mg | Freq: Once | INTRAMUSCULAR | Status: AC
  Administered 2013-12-16: 1 mg via INTRAVENOUS
  Filled 2013-12-16: qty 1

## 2013-12-16 MED ORDER — ONDANSETRON HCL 4 MG/2ML IJ SOLN
4.0000 mg | Freq: Once | INTRAMUSCULAR | Status: AC
  Administered 2013-12-16: 4 mg via INTRAVENOUS
  Filled 2013-12-16: qty 2

## 2013-12-16 MED ORDER — LORAZEPAM 2 MG/ML IJ SOLN
1.0000 mg | Freq: Once | INTRAMUSCULAR | Status: AC
  Administered 2013-12-17: 1 mg via INTRAVENOUS
  Filled 2013-12-16: qty 1

## 2013-12-16 MED ORDER — METOPROLOL TARTRATE 25 MG PO TABS
50.0000 mg | ORAL_TABLET | Freq: Once | ORAL | Status: AC
  Administered 2013-12-16: 50 mg via ORAL
  Filled 2013-12-16: qty 2

## 2013-12-16 MED ORDER — GI COCKTAIL ~~LOC~~
30.0000 mL | Freq: Once | ORAL | Status: AC
  Administered 2013-12-16: 30 mL via ORAL
  Filled 2013-12-16: qty 30

## 2013-12-16 MED ORDER — SODIUM CHLORIDE 0.9 % IV BOLUS (SEPSIS)
2000.0000 mL | Freq: Once | INTRAVENOUS | Status: AC
  Administered 2013-12-16: 2000 mL via INTRAVENOUS

## 2013-12-16 NOTE — ED Notes (Signed)
[name], [name] has been made aware of pt's sudden change in blood pressure.

## 2013-12-16 NOTE — ED Notes (Signed)
Pt has hx of pancreatitis and states that he drank last night and is now having abdominal pain, N/V/D. Alert, oriented, ambulatory.

## 2013-12-16 NOTE — ED Notes (Signed)
Pt is unable to give urine specimen at this time, stated "give him a few minutes." Will reattempt collection at 2010.

## 2013-12-16 NOTE — ED Provider Notes (Signed)
CSN: [id number]     Arrival date & time 12/16/13  1813 History   First MD Initiated Contact with Patient 12/16/13 1955     Chief Complaint  Patient presents with  . Abdominal Pain     (Consider location/radiation/quality/duration/timing/severity/associated sxs/prior Treatment) The history is provided by the patient and medical records. No language interpreter was used.    [name] is a 43 y.o. male  with a hx of HTN, asthma, cocaine abuse, WPW presents to the Emergency Department complaining of intermittent epigastric abd pain onset today which has been gradual, persistent, progressively worsening throughout the day.  Associated symptoms include nausea and vomiting x3, with diarrhea x3 days.  Pt reports melena but no hematochezia or hematemesis.  Pt reports dilaudid and zofran makes it better and food and EtOH makes it worse.  Pt reports he was drinking EtOH last night and reports that he ate stew chicken and french fries today after which his pain increased significantly.  Pain is rated at an 8/10.  No hx of abd surgeries.  He is also taking pancreatic enzymes.  Pt denies fever, chills, headache, neck pain, chest pain, SOB, weakness, dizziness, syncope, dysuria.       Past Medical History  Diagnosis Date  . Hypertension   . Asthma   . Pancreatitis   . Cocaine abuse   . Depression   . H/O suicide attempt   . Heart murmur     "when he was little" (03/06/2013)  . Shortness of breath     "can happen at anytime" (03/06/2013)  . Anemia   . H/O hiatal hernia   . GERD (gastroesophageal reflux disease)   . Arthritis     "knees" (03/06/2013)  . Anxiety   . WPW (Wolff-Parkinson-White syndrome)     [name] 03/06/2013   Past Surgical History  Procedure Laterality Date  . Facial fracture surgery Left 1990's    "result of trauma" (03/06/2013)  . Eye surgery Left 1990's    "result of trauma" (03/06/2013)   Family History  Problem Relation Age of Onset  . Hypertension Other   .  Coronary artery disease Other    History  Substance Use Topics  . Smoking status: Current Every Day Smoker -- 1.50 packs/day for 30 years    Types: Cigarettes  . Smokeless tobacco: Current User  . Alcohol Use: Yes     Comment: 12 pk beer daily 12oz cans liquor occasionally    Review of Systems  Constitutional: Negative for fever, diaphoresis, appetite change, fatigue and unexpected weight change.  HENT: Negative for mouth sores.   Eyes: Negative for visual disturbance.  Respiratory: Negative for cough, chest tightness, shortness of breath and wheezing.   Cardiovascular: Negative for chest pain.  Gastrointestinal: Positive for nausea, vomiting and abdominal pain. Negative for diarrhea and constipation.  Endocrine: Negative for polydipsia, polyphagia and polyuria.  Genitourinary: Negative for dysuria, urgency, frequency and hematuria.  Musculoskeletal: Negative for back pain and neck stiffness.  Skin: Negative for rash.  Allergic/Immunologic: Negative for immunocompromised state.  Neurological: Negative for syncope, light-headedness and headaches.  Hematological: Does not bruise/bleed easily.  Psychiatric/Behavioral: Negative for sleep disturbance. The patient is not nervous/anxious.       Allergies  Trazodone and nefazodone and Shellfish-derived products  Home Medications   Prior to Admission medications   Medication Sig Start Date End Date Taking? Authorizing Provider  acetaminophen (TYLENOL) 500 MG tablet Take 1,000 mg by mouth every 6 (six) hours as needed for mild pain.  Yes Historical Provider, MD  albuterol (PROVENTIL HFA;VENTOLIN HFA) 108 (90 BASE) MCG/ACT inhaler Inhale 1-2 puffs into the lungs every 6 (six) hours as needed for wheezing or shortness of breath. 11/26/13  Yes Shanker Kristeen Mans, MD  Alum & Mag Hydroxide-Simeth (GI COCKTAIL) SUSP suspension Take 30 mLs by mouth 3 (three) times daily as needed for indigestion. Shake well. 11/28/13  Yes Angelica Chessman, MD   feeding supplement, ENSURE COMPLETE, (ENSURE COMPLETE) LIQD Take 237 mLs by mouth 2 (two) times daily between meals. 11/26/13  Yes Shanker Kristeen Mans, MD  folic acid (FOLVITE) 1 MG tablet Take 1 tablet (1 mg total) by mouth daily. 11/26/13  Yes Shanker Kristeen Mans, MD  ibuprofen (ADVIL,MOTRIN) 800 MG tablet Take 800 mg by mouth every 8 (eight) hours as needed for moderate pain.   Yes Historical Provider, MD  lipase/protease/amylase (CREON-12/PANCREASE) 12000 UNITS CPEP capsule Take 1 capsule by mouth 3 (three) times daily before meals. 11/28/13  Yes Angelica Chessman, MD  LORazepam (ATIVAN) 1 MG tablet Take 1 tablet (1 mg total) by mouth every 8 (eight) hours as needed for anxiety. 11/28/13  Yes Angelica Chessman, MD  metoprolol (LOPRESSOR) 50 MG tablet Take 1 tablet (50 mg total) by mouth 2 (two) times daily. For high blood pressure 11/28/13  Yes Angelica Chessman, MD  Multiple Vitamin (MULTIVITAMIN WITH MINERALS) TABS tablet Take 1 tablet by mouth daily. For low vitamin 11/28/13  Yes Angelica Chessman, MD  ondansetron (ZOFRAN) 4 MG tablet Take 1 tablet (4 mg total) by mouth every 6 (six) hours. 12/09/13  Yes Ezequiel Essex, MD  oxyCODONE-acetaminophen (PERCOCET) 5-325 MG per tablet Take 2 tablets by mouth every 4 (four) hours as needed. 12/11/13  Yes Viona Gilmore Cartner, PA-C  pantoprazole (PROTONIX) 40 MG tablet Take 1 tablet (40 mg total) by mouth 2 (two) times daily. 11/28/13  Yes Angelica Chessman, MD  promethazine (PHENERGAN) 25 MG tablet Take 1 tablet (25 mg total) by mouth every 6 (six) hours as needed for nausea or vomiting. 12/11/13  Yes Viona Gilmore Cartner, PA-C  QUEtiapine (SEROQUEL) 100 MG tablet Take 100 mg by mouth at bedtime. For mood control 11/28/13  Yes Angelica Chessman, MD  sertraline (ZOLOFT) 25 MG tablet Take 3 tablets (75 mg total) by mouth daily. For depression 11/28/13  Yes Angelica Chessman, MD  thiamine 100 MG tablet Take 1 tablet (100 mg total) by mouth daily. 11/28/13  Yes Angelica Chessman, MD  oxyCODONE-acetaminophen (PERCOCET) 5-325 MG per tablet Take 1 tablet by mouth every 4 (four) hours as needed. 12/17/13   Jalesa Thien, PA-C  promethazine (PHENERGAN) 25 MG tablet Take 1 tablet (25 mg total) by mouth every 6 (six) hours as needed for nausea or vomiting. 12/17/13   Jarrett Soho Araiyah Cumpton, PA-C  ranitidine (ZANTAC) 150 MG tablet Take 1 tablet (150 mg total) by mouth 2 (two) times daily. 12/11/13   Viona Gilmore Cartner, PA-C   BP 153/98  Pulse 81  Temp(Src) 98.2 F (36.8 C) (Oral)  Resp 14  Ht 5\' 8"  (1.727 m)  Wt 137 lb (62.143 kg)  BMI 20.84 kg/m2  SpO2 98% Physical Exam  Nursing note and vitals reviewed. Constitutional: He is oriented to person, place, and time. He appears well-developed and well-nourished. No distress.  Awake, alert, nontoxic appearance  HENT:  Head: Normocephalic and atraumatic.  Mouth/Throat: Oropharynx is clear and moist. No oropharyngeal exudate.  Eyes: Conjunctivae are normal. No scleral icterus.  Neck: Normal range of motion. Neck supple.  Cardiovascular: Normal rate, regular  rhythm, normal heart sounds and intact distal pulses.   No murmur heard. Pulmonary/Chest: Effort normal and breath sounds normal. No respiratory distress. He has no wheezes.  Equal chest expansion  Abdominal: Soft. Bowel sounds are normal. He exhibits no distension and no mass. There is tenderness in the epigastric area. There is guarding. There is no rebound and no CVA tenderness.  Moderate epigastric tenderness with voluntary guarding, very mild right upper quadrant and left upper cord and tenderness without guarding Negative Murphy's sign No CVA tenderness  Musculoskeletal: Normal range of motion. He exhibits no edema.  Neurological: He is alert and oriented to person, place, and time. He exhibits normal muscle tone. Coordination normal.  Speech is clear and goal oriented Moves extremities without ataxia  Skin: Skin is warm and dry. He is not diaphoretic. No  erythema.  Psychiatric: He has a normal mood and affect.    ED Course  Procedures (including critical care time) Labs Review Labs Reviewed  CBC WITH DIFFERENTIAL - Abnormal; Notable for the following:    RBC 3.78 (*)    Hemoglobin 11.6 (*)    HCT 35.0 (*)    RDW 16.7 (*)    All other components within normal limits  COMPREHENSIVE METABOLIC PANEL - Abnormal; Notable for the following:    Albumin 3.4 (*)    AST 58 (*)    Total Bilirubin 0.2 (*)    All other components within normal limits  LIPASE, BLOOD - Abnormal; Notable for the following:    Lipase 158 (*)    All other components within normal limits  URINALYSIS, ROUTINE W REFLEX MICROSCOPIC    Imaging Review No results found.   EKG Interpretation None      MDM   Final diagnoses:  Alcohol-induced chronic pancreatitis  Epigastric pain  HYPERTENSION   [name] presents with epigastric pain, N/V and Hx of chronic pancreatitis, likely alcoholic, worse today after drinking last night and eating fried foods today.  Will give pain control, fluids and check basic labs.  No rebound or peritoneal signs.    9:45PM Pt is feeling some better and requesting ice chips.     10:43 PM RN reports that pt is now hypertensive.  Pt with Hx of same and reports he has not had his nightly medications, including lopressor and ativan.  He also reports that he continues to hurt.   BP 175/109  Pulse 87  Temp(Src) 98.3 F (36.8 C) (Oral)  Resp 18  Ht 5\' 8"  (1.727 m)  Wt 137 lb (62.143 kg)  BMI 20.84 kg/m2  SpO2 98%   12:31 AM Pt with decreasing BP and tolerating PO without difficulty.  Pt symptoms and labs consistent with chronic pancreatitis exacerbated today by EtOH and food.  [name] lipase has decreased since his last visit.  He continues to ask for pain control but appears in no distress and is tolerating PO.  Abd is soft and nontender on repeat exam.  At this time I believe he is stable for d/c home.  Pt again counseled  about EtOH use and poor dietary choices.    I have personally reviewed patient's vitals, nursing note and any pertinent labs or imaging.  I performed an undressed physical exam.    At this time, it has been determined that no acute conditions requiring further emergency intervention. The patient/guardian have been advised of the diagnosis and plan. I reviewed all labs and imaging including any potential incidental findings. We have discussed signs and symptoms  that warrant return to the ED, such as intractable vomiting.  Patient/guardian has voiced understanding and agreed to follow-up with the PCP or specialist in 3 days.  Vital signs are stable at discharge.   BP 153/98  Pulse 81  Temp(Src) 98.2 F (36.8 C) (Oral)  Resp 14  Ht 5\' 8"  (1.727 m)  Wt 137 lb (62.143 kg)  BMI 20.84 kg/m2  SpO2 98%        Abigail Butts, PA-C 12/17/13 0107

## 2013-12-16 NOTE — ED Notes (Signed)
Pt states he feels he is having pancreatitis. Pt has hx of pancreatitis and one month ago was admitted for 6 days.  He presents with left upper abdominal pain, nausea, vomiting, and diarrhea. Symptoms started at 1330 today. Pt states he has vomited x2 since 1330.

## 2013-12-17 MED ORDER — PROMETHAZINE HCL 25 MG PO TABS: 25.0000 mg | ORAL_TABLET | Freq: Four times a day (QID) | ORAL | Status: DC | PRN

## 2013-12-17 MED ORDER — OXYCODONE-ACETAMINOPHEN 5-325 MG PO TABS
1.0000 | ORAL_TABLET | Freq: Once | ORAL | Status: AC
  Administered 2013-12-17: 1 via ORAL
  Filled 2013-12-17: qty 1

## 2013-12-17 MED ORDER — OXYCODONE-ACETAMINOPHEN 5-325 MG PO TABS: 1.0000 | ORAL_TABLET | ORAL | Status: DC | PRN

## 2013-12-17 NOTE — Discharge Instructions (Signed)
1. Medications: phenergan, percocet, usual home medications 2. Treatment: rest, drink plenty of fluids,  3. Follow Up: Please followup with your primary doctor for discussion of your diagnoses and further evaluation after today's visit;    Low-Fat Diet for Pancreatitis or Gallbladder Conditions A low-fat diet can be helpful if you have pancreatitis or a gallbladder condition. With these conditions, your pancreas and gallbladder have trouble digesting fats. A healthy eating plan with less fat will help rest your pancreas and gallbladder and reduce your symptoms. WHAT DO I NEED TO KNOW ABOUT THIS DIET?  Eat a low-fat diet.  Reduce your fat intake to less than 20-30% of your total daily calories. This is less than 50-60 g of fat per day.  Remember that you need some fat in your diet. Ask your dietician what your daily goal should be.  Choose nonfat and low-fat healthy foods. Look for the words "nonfat," "low fat," or "fat free."  As a guide, look on the label and choose foods with less than 3 g of fat per serving. Eat only one serving.  Avoid alcohol.  Do not smoke. If you need help quitting, talk with your health care provider.  Eat small frequent meals instead of three large heavy meals. WHAT FOODS CAN I EAT? Grains Include healthy grains and starches such as potatoes, wheat bread, fiber-rich cereal, and brown rice. Choose whole grain options whenever possible. In adults, whole grains should account for 45-65% of your daily calories.  Fruits and Vegetables Eat plenty of fruits and vegetables. Fresh fruits and vegetables add fiber to your diet. Meats and Other Protein Sources Eat lean meat such as chicken and pork. Trim any fat off of meat before cooking it. Eggs, fish, and beans are other sources of protein. In adults, these foods should account for 10-35% of your daily calories. Dairy Choose low-fat milk and dairy options. Dairy includes fat and protein, as well as calcium.  Fats and  Oils Limit high-fat foods such as fried foods, sweets, baked goods, sugary drinks.  Other Creamy sauces and condiments, such as mayonnaise, can add extra fat. Think about whether or not you need to use them, or use smaller amounts or low fat options. WHAT FOODS ARE NOT RECOMMENDED?  High fat foods, such as:  [employer].  Ice cream.  [place] toast.  Sweet rolls.  Pizza.  Cheese bread.  Foods covered with batter, butter, creamy sauces, or cheese.  Fried foods.  Sugary drinks and desserts.  Foods that cause gas or bloating Document Released: 04/30/2013 Document Reviewed: 04/30/2013 [facility] Patient Information 2015 [place], [place]. This information is not intended to replace advice given to you by your health care provider. Make sure you discuss any questions you have with your health care provider.

## 2013-12-19 ENCOUNTER — Ambulatory Visit: Payer: Self-pay

## 2013-12-19 NOTE — ED Provider Notes (Signed)
Medical screening examination/treatment/procedure(s) were performed by non-physician practitioner and as supervising physician I was immediately available for consultation/collaboration.   EKG Interpretation None       Virgel Manifold, MD 12/19/13 [phone]

## 2013-12-21 IMAGING — CR DG ABDOMEN ACUTE W/ 1V CHEST
3 series · 3 of 3 positions shown · non-contrast
Comparison: 06/06/2010 and prior radiographs

CLINICAL DATA: 40-year-old male with mid abdominal pain.  History
of pancreatitis.

ACUTE ABDOMEN SERIES (ABDOMEN 2 VIEW & CHEST 1 VIEW)

[w chest pa]
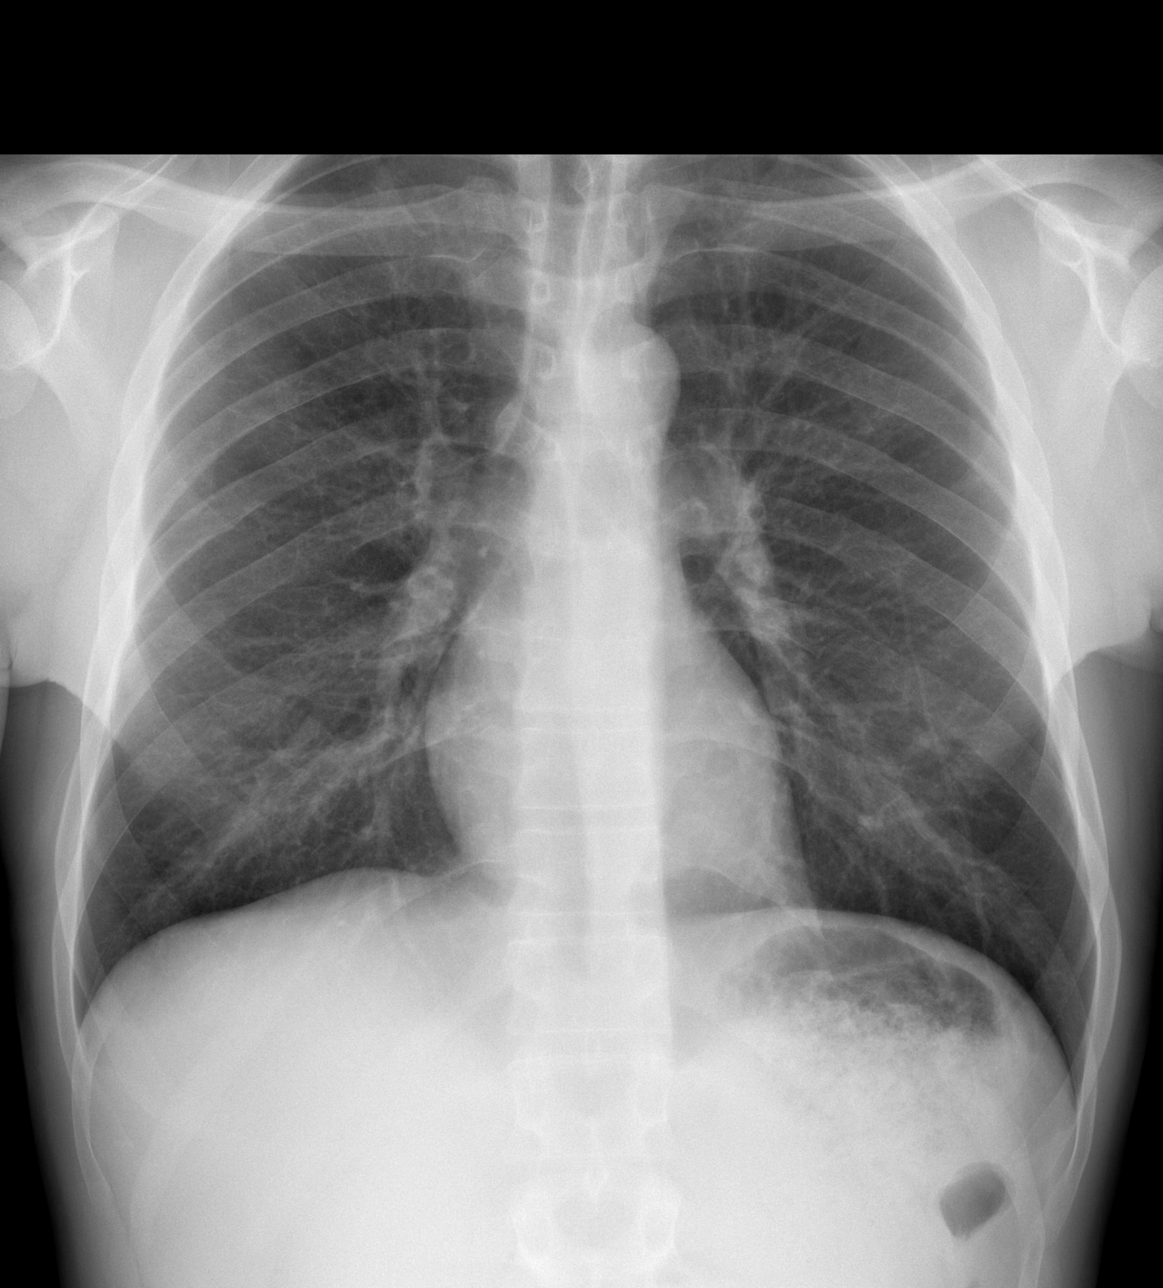

[w abdomen upright]
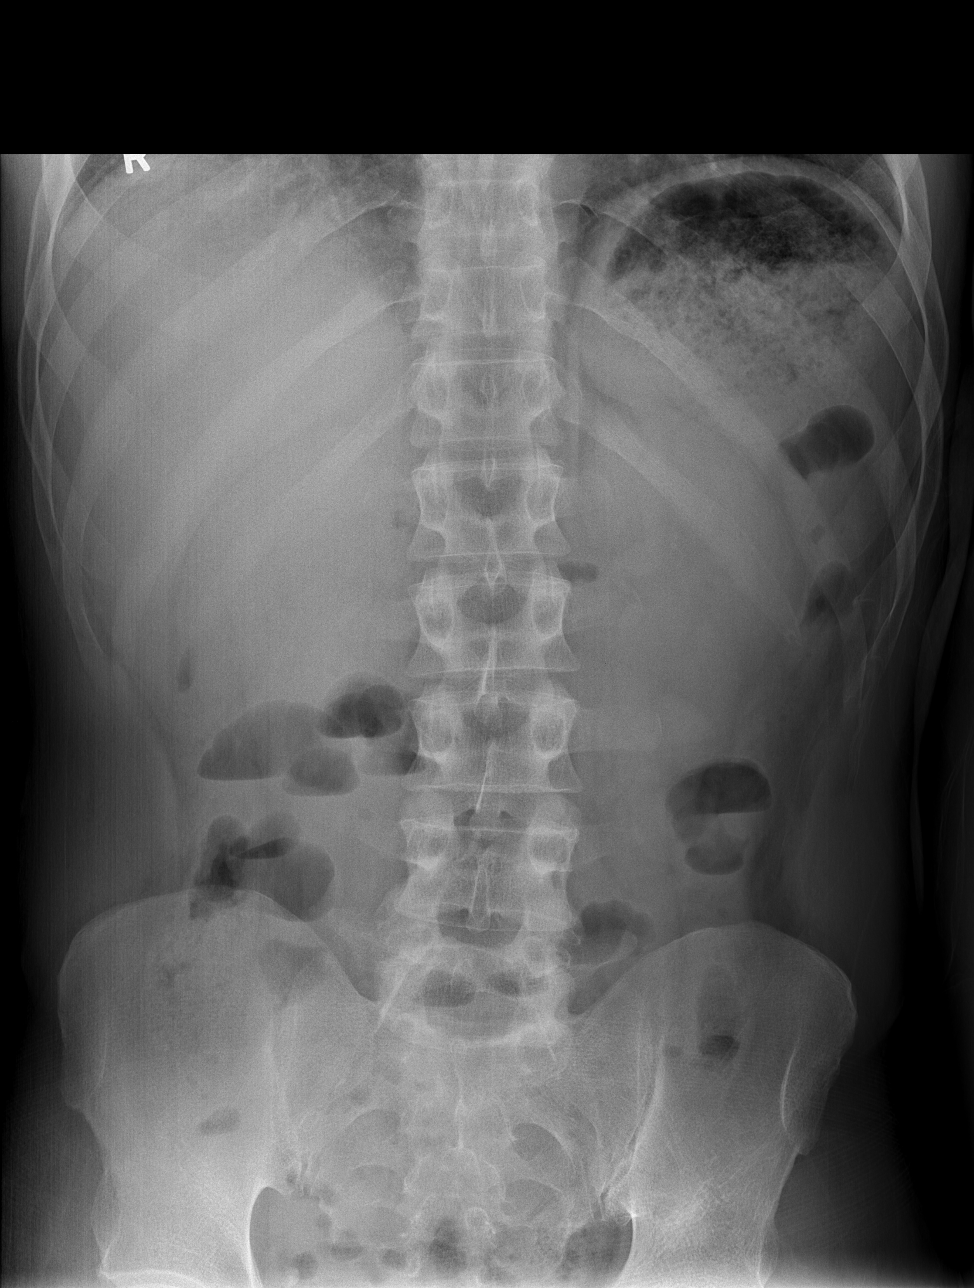

[t abdomen supine]
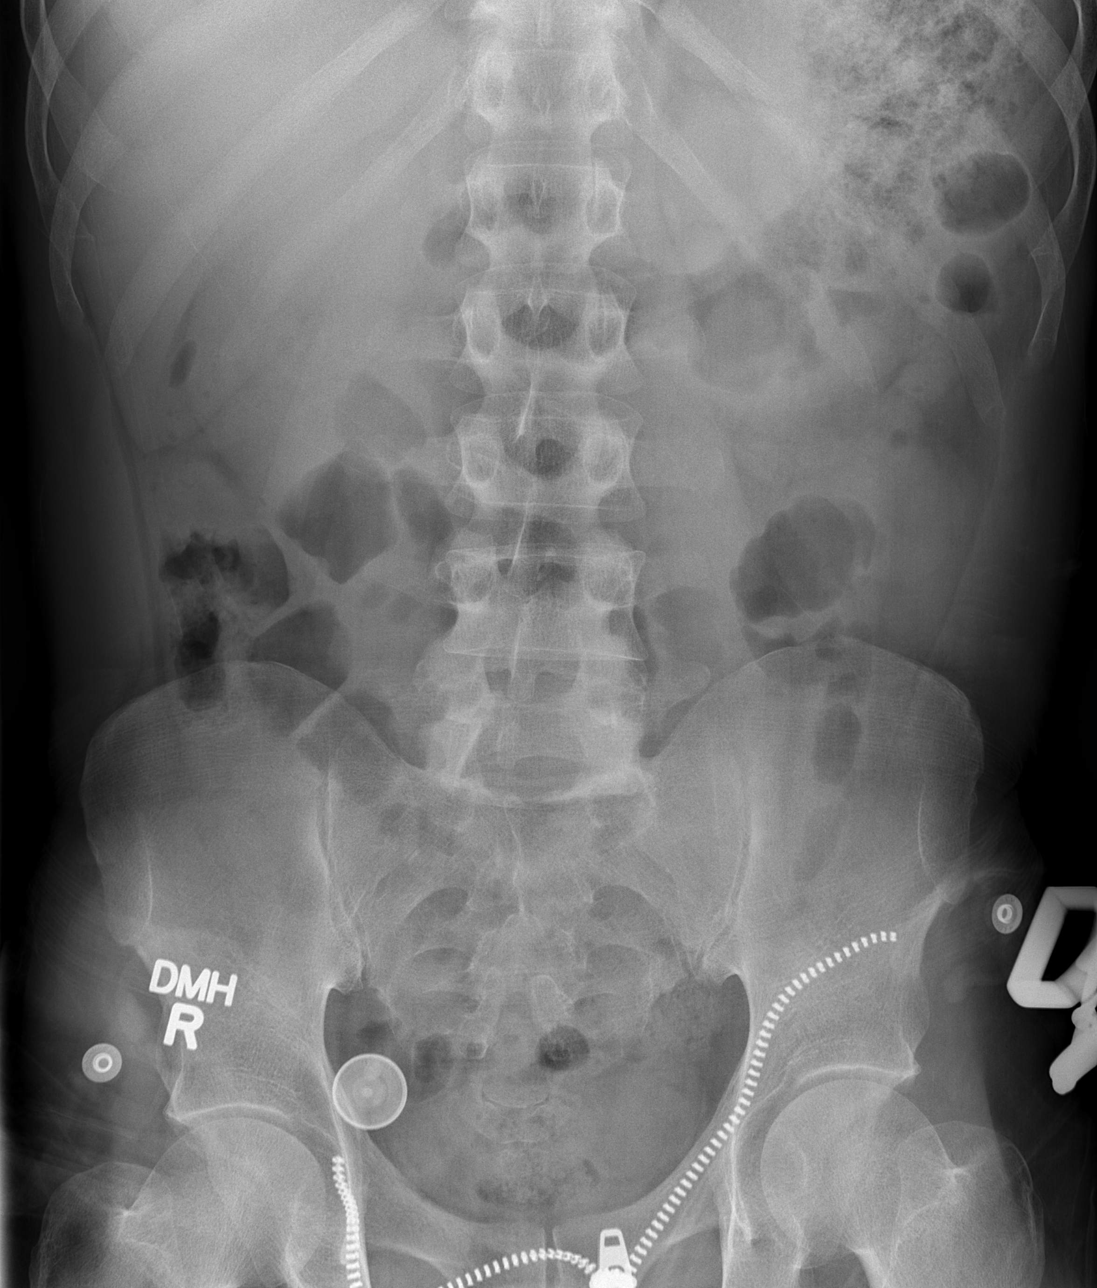

[3 of 3 positions shown; findings below may reference images not displayed]

FINDINGS: The cardiomediastinal silhouette is unremarkable.
Increased density at the right lung apex medially is identified and
new since prior studies.
The lungs are otherwise clear.
There is no evidence of pleural effusion or pneumothorax.

Nondistended gas and fluid filled loops of bowel are identified.
No dilated bowel loops or pneumoperitoneum identified.
No suspicious calcifications are present.
The bony structures are unremarkable.
IMPRESSION: Increased density in the medial right lung apex - airspace disease
or mass is not excluded.  Chest CT with contrast is recommended for
further evaluation.

Nonspecific nonobstructive bowel gas pattern - no evidence of
pneumoperitoneum.

## 2013-12-25 ENCOUNTER — Encounter (HOSPITAL_COMMUNITY): Payer: Self-pay | Admitting: Emergency Medicine

## 2013-12-25 ENCOUNTER — Inpatient Hospital Stay (HOSPITAL_COMMUNITY)
Admission: EM | Admit: 2013-12-25 | Discharge: 2013-12-31 | DRG: 438 | Disposition: A | Discharge status: Home / Self Care | Payer: Self-pay | Attending: Internal Medicine | Admitting: Internal Medicine

## 2013-12-25 DIAGNOSIS — F1910 Other psychoactive substance abuse, uncomplicated: Secondary | ICD-10-CM

## 2013-12-25 DIAGNOSIS — K863 Pseudocyst of pancreas: Secondary | ICD-10-CM

## 2013-12-25 DIAGNOSIS — E43 Unspecified severe protein-calorie malnutrition: Secondary | ICD-10-CM

## 2013-12-25 DIAGNOSIS — Z862 Personal history of diseases of the blood and blood-forming organs and certain disorders involving the immune mechanism: Secondary | ICD-10-CM

## 2013-12-25 DIAGNOSIS — Z8639 Personal history of other endocrine, nutritional and metabolic disease: Secondary | ICD-10-CM

## 2013-12-25 DIAGNOSIS — I456 Pre-excitation syndrome: Secondary | ICD-10-CM

## 2013-12-25 DIAGNOSIS — M549 Dorsalgia, unspecified: Secondary | ICD-10-CM

## 2013-12-25 DIAGNOSIS — F332 Major depressive disorder, recurrent severe without psychotic features: Secondary | ICD-10-CM

## 2013-12-25 DIAGNOSIS — F411 Generalized anxiety disorder: Secondary | ICD-10-CM | POA: Diagnosis present

## 2013-12-25 DIAGNOSIS — Z681 Body mass index (BMI) 19 or less, adult: Secondary | ICD-10-CM

## 2013-12-25 DIAGNOSIS — K862 Cyst of pancreas: Secondary | ICD-10-CM

## 2013-12-25 DIAGNOSIS — G9340 Encephalopathy, unspecified: Secondary | ICD-10-CM

## 2013-12-25 DIAGNOSIS — J45909 Unspecified asthma, uncomplicated: Secondary | ICD-10-CM

## 2013-12-25 DIAGNOSIS — K861 Other chronic pancreatitis: Secondary | ICD-10-CM | POA: Diagnosis present

## 2013-12-25 DIAGNOSIS — R109 Unspecified abdominal pain: Secondary | ICD-10-CM

## 2013-12-25 DIAGNOSIS — F3289 Other specified depressive episodes: Secondary | ICD-10-CM

## 2013-12-25 DIAGNOSIS — R079 Chest pain, unspecified: Secondary | ICD-10-CM

## 2013-12-25 DIAGNOSIS — F1014 Alcohol abuse with alcohol-induced mood disorder: Secondary | ICD-10-CM

## 2013-12-25 DIAGNOSIS — K089 Disorder of teeth and supporting structures, unspecified: Secondary | ICD-10-CM

## 2013-12-25 DIAGNOSIS — R000 Tachycardia, unspecified: Secondary | ICD-10-CM | POA: Diagnosis present

## 2013-12-25 DIAGNOSIS — M6740 Ganglion, unspecified site: Secondary | ICD-10-CM

## 2013-12-25 DIAGNOSIS — K852 Alcohol induced acute pancreatitis without necrosis or infection: Secondary | ICD-10-CM

## 2013-12-25 DIAGNOSIS — F329 Major depressive disorder, single episode, unspecified: Secondary | ICD-10-CM

## 2013-12-25 DIAGNOSIS — Z23 Encounter for immunization: Secondary | ICD-10-CM

## 2013-12-25 DIAGNOSIS — F1010 Alcohol abuse, uncomplicated: Secondary | ICD-10-CM

## 2013-12-25 DIAGNOSIS — E876 Hypokalemia: Secondary | ICD-10-CM | POA: Diagnosis present

## 2013-12-25 DIAGNOSIS — W57XXXA Bitten or stung by nonvenomous insect and other nonvenomous arthropods, initial encounter: Secondary | ICD-10-CM

## 2013-12-25 DIAGNOSIS — IMO0002 Reserved for concepts with insufficient information to code with codable children: Secondary | ICD-10-CM

## 2013-12-25 DIAGNOSIS — F17200 Nicotine dependence, unspecified, uncomplicated: Secondary | ICD-10-CM

## 2013-12-25 DIAGNOSIS — J309 Allergic rhinitis, unspecified: Secondary | ICD-10-CM

## 2013-12-25 DIAGNOSIS — K449 Diaphragmatic hernia without obstruction or gangrene: Secondary | ICD-10-CM

## 2013-12-25 DIAGNOSIS — K859 Acute pancreatitis without necrosis or infection, unspecified: Principal | ICD-10-CM | POA: Diagnosis present

## 2013-12-25 DIAGNOSIS — I10 Essential (primary) hypertension: Secondary | ICD-10-CM

## 2013-12-25 DIAGNOSIS — M1710 Unilateral primary osteoarthritis, unspecified knee: Secondary | ICD-10-CM | POA: Diagnosis present

## 2013-12-25 DIAGNOSIS — R55 Syncope and collapse: Secondary | ICD-10-CM

## 2013-12-25 DIAGNOSIS — F32A Depression, unspecified: Secondary | ICD-10-CM

## 2013-12-25 DIAGNOSIS — K2980 Duodenitis without bleeding: Secondary | ICD-10-CM | POA: Diagnosis present

## 2013-12-25 DIAGNOSIS — T1491XA Suicide attempt, initial encounter: Secondary | ICD-10-CM

## 2013-12-25 LAB — URINALYSIS, ROUTINE W REFLEX MICROSCOPIC
Glucose, UA: NEGATIVE mg/dL
Hgb urine dipstick: NEGATIVE
Ketones, ur: 15 mg/dL — AB
LEUKOCYTES UA: NEGATIVE
NITRITE: NEGATIVE
PH: 5 (ref 5.0–8.0)
Protein, ur: NEGATIVE mg/dL
Specific Gravity, Urine: 1.031 — ABNORMAL HIGH (ref 1.005–1.030)
Urobilinogen, UA: 0.2 mg/dL (ref 0.0–1.0)

## 2013-12-25 LAB — COMPREHENSIVE METABOLIC PANEL
ALT: 44 U/L (ref 0–53)
AST: 68 U/L — AB (ref 0–37)
Albumin: 4.6 g/dL (ref 3.5–5.2)
Alkaline Phosphatase: 173 U/L — ABNORMAL HIGH (ref 39–117)
Anion gap: 18 — ABNORMAL HIGH (ref 5–15)
BUN: 8 mg/dL (ref 6–23)
CALCIUM: 10.3 mg/dL (ref 8.4–10.5)
CO2: 23 mEq/L (ref 19–32)
Chloride: 99 mEq/L (ref 96–112)
Creatinine, Ser: 0.63 mg/dL (ref 0.50–1.35)
GFR calc Af Amer: >90 mL/min (ref >90)
GFR calc non Af Amer: >90 mL/min (ref >90)
Glucose, Bld: 111 mg/dL — ABNORMAL HIGH (ref 70–99)
Potassium: 3.9 mEq/L (ref 3.7–5.3)
Sodium: 140 mEq/L (ref 137–147)
TOTAL PROTEIN: 9.2 g/dL — AB (ref 6.0–8.3)
Total Bilirubin: 0.8 mg/dL (ref 0.3–1.2)

## 2013-12-25 LAB — CBC WITH DIFFERENTIAL/PLATELET
BASOS ABS: 0 10*3/uL (ref 0.0–0.1)
BASOS PCT: 0 % (ref 0–1)
EOS PCT: 4 % (ref 0–5)
Eosinophils Absolute: 0.4 10*3/uL (ref 0.0–0.7)
HEMATOCRIT: 38.6 % — AB (ref 39.0–52.0)
Hemoglobin: 13.3 g/dL (ref 13.0–17.0)
Lymphocytes Relative: 23 % (ref 12–46)
Lymphs Abs: 2.6 10*3/uL (ref 0.7–4.0)
MCH: 31.4 pg (ref 26.0–34.0)
MCHC: 34.5 g/dL (ref 30.0–36.0)
MCV: 91.3 fL (ref 78.0–100.0)
MONO ABS: 0.7 10*3/uL (ref 0.1–1.0)
Monocytes Relative: 6 % (ref 3–12)
NEUTROS ABS: 7.6 10*3/uL (ref 1.7–7.7)
Neutrophils Relative %: 67 % (ref 43–77)
Platelets: 237 10*3/uL (ref 150–400)
RBC: 4.23 MIL/uL (ref 4.22–5.81)
RDW: 16.9 % — ABNORMAL HIGH (ref 11.5–15.5)
WBC: 11.5 10*3/uL — ABNORMAL HIGH (ref 4.0–10.5)

## 2013-12-25 LAB — LIPASE, BLOOD: LIPASE: 467 U/L — AB (ref 11–59)

## 2013-12-25 MED ORDER — HYDROMORPHONE HCL PF 1 MG/ML IJ SOLN
1.0000 mg | INTRAMUSCULAR | Status: DC | PRN
  Administered 2013-12-26 – 2013-12-29 (×34): 1 mg via INTRAVENOUS
  Filled 2013-12-25 (×34): qty 1

## 2013-12-25 MED ORDER — HYDROMORPHONE HCL PF 1 MG/ML IJ SOLN
1.0000 mg | Freq: Once | INTRAMUSCULAR | Status: AC
  Administered 2013-12-25: 1 mg via INTRAVENOUS
  Filled 2013-12-25: qty 1

## 2013-12-25 MED ORDER — ONDANSETRON HCL 4 MG/2ML IJ SOLN
4.0000 mg | Freq: Once | INTRAMUSCULAR | Status: AC
  Administered 2013-12-25: 4 mg via INTRAVENOUS
  Filled 2013-12-25: qty 2

## 2013-12-25 MED ORDER — FENTANYL CITRATE 0.05 MG/ML IJ SOLN
INTRAMUSCULAR | Status: AC
  Filled 2013-12-25: qty 2

## 2013-12-25 MED ORDER — ONDANSETRON 4 MG PO TBDP
8.0000 mg | ORAL_TABLET | Freq: Once | ORAL | Status: AC
  Administered 2013-12-25: 8 mg via ORAL
  Filled 2013-12-25: qty 2

## 2013-12-25 MED ORDER — SODIUM CHLORIDE 0.9 % IV BOLUS (SEPSIS)
1000.0000 mL | Freq: Once | INTRAVENOUS | Status: AC
  Administered 2013-12-25: 1000 mL via INTRAVENOUS

## 2013-12-25 MED ORDER — GI COCKTAIL ~~LOC~~
30.0000 mL | Freq: Once | ORAL | Status: AC
  Administered 2013-12-25: 30 mL via ORAL
  Filled 2013-12-25: qty 30

## 2013-12-25 MED ORDER — SODIUM CHLORIDE 0.9 % IV SOLN: INTRAVENOUS | Status: DC

## 2013-12-25 MED ORDER — FENTANYL CITRATE 0.05 MG/ML IJ SOLN
50.0000 ug | Freq: Once | INTRAMUSCULAR | Status: AC
  Administered 2013-12-25: 50 ug via NASAL

## 2013-12-25 MED ORDER — SODIUM CHLORIDE 0.9 % IV BOLUS (SEPSIS): 1000.0000 mL | Freq: Once | INTRAVENOUS | Status: DC

## 2013-12-25 NOTE — H&P (Signed)
[facility] History and Physical  [name] QMG:[id number] DOB: 11/06/1970 DOA: 12/25/2013  Referring physician: ER physician. PCP: Angelica Chessman, MD   Chief Complaint: Abdominal pain.  HPI: [name] is a 43 y.o. male with history of chronic alcoholic pancreatitis, WPW syndrome, depression presented to the ER with complaints of abdominal pain. Patient states he has been having abdominal pain worsening over the last 24 hours. Pain is mostly on the epigastric and left quadrants. Patient has had associated nausea vomiting and diarrhea. In the ER patient was found to have labs with elevated lipase and patient was tachycardic. Patient was given a fluid bolus and admitted for further pain management of acute on chronic pancreatitis. Patient states that his last drink was one week ago. Denies any chest pain or shortness of breath fever chills.   Review of Systems: As presented in the history of presenting illness, rest negative.  Past Medical History  Diagnosis Date  . Hypertension   . Asthma   . Pancreatitis   . Cocaine abuse   . Depression   . H/O suicide attempt   . Heart murmur     "when he was little" (03/06/2013)  . Shortness of breath     "can happen at anytime" (03/06/2013)  . Anemia   . H/O hiatal hernia   . GERD (gastroesophageal reflux disease)   . Arthritis     "knees" (03/06/2013)  . Anxiety   . WPW (Wolff-Parkinson-White syndrome)     [name] 03/06/2013   Past Surgical History  Procedure Laterality Date  . Facial fracture surgery Left 1990's    "result of trauma" (03/06/2013)  . Eye surgery Left 1990's    "result of trauma" (03/06/2013)   Social History:  reports that he has been smoking Cigarettes.  He has a 45 pack-year smoking history. He uses smokeless tobacco. He reports that he drinks alcohol. He reports that he does not use illicit drugs. Where does patient live home. Can patient participate in ADLs? Yes.  Allergies  Allergen  Reactions  . Trazodone And Nefazodone Other (See Comments)    Muscle spasms  . Shellfish-Derived Products Nausea And Vomiting    Family History:  Family History  Problem Relation Age of Onset  . Hypertension Other   . Coronary artery disease Other       Prior to Admission medications   Medication Sig Start Date End Date Taking? Authorizing Provider  acetaminophen (TYLENOL) 500 MG tablet Take 1,000 mg by mouth every 6 (six) hours as needed for mild pain.   Yes Historical Provider, MD  albuterol (PROVENTIL HFA;VENTOLIN HFA) 108 (90 BASE) MCG/ACT inhaler Inhale 1-2 puffs into the lungs every 6 (six) hours as needed for wheezing or shortness of breath. 11/26/13  Yes Shanker Kristeen Mans, MD  Alum & Mag Hydroxide-Simeth (GI COCKTAIL) SUSP suspension Take 30 mLs by mouth 3 (three) times daily as needed for indigestion. Shake well. 11/28/13  Yes Angelica Chessman, MD  feeding supplement, ENSURE COMPLETE, (ENSURE COMPLETE) LIQD Take 237 mLs by mouth 2 (two) times daily between meals. 11/26/13  Yes Shanker Kristeen Mans, MD  folic acid (FOLVITE) 1 MG tablet Take 1 tablet (1 mg total) by mouth daily. 11/26/13  Yes Shanker Kristeen Mans, MD  ibuprofen (ADVIL,MOTRIN) 800 MG tablet Take 800 mg by mouth every 8 (eight) hours as needed for moderate pain.   Yes Historical Provider, MD  lipase/protease/amylase (CREON-12/PANCREASE) 12000 UNITS CPEP capsule Take 1 capsule by mouth 3 (three) times daily before meals.  11/28/13  Yes Angelica Chessman, MD  LORazepam (ATIVAN) 1 MG tablet Take 1 tablet (1 mg total) by mouth every 8 (eight) hours as needed for anxiety. 11/28/13  Yes Angelica Chessman, MD  metoprolol (LOPRESSOR) 50 MG tablet Take 1 tablet (50 mg total) by mouth 2 (two) times daily. For high blood pressure 11/28/13  Yes Angelica Chessman, MD  Multiple Vitamin (MULTIVITAMIN WITH MINERALS) TABS tablet Take 1 tablet by mouth daily. For low vitamin 11/28/13  Yes Angelica Chessman, MD  ondansetron (ZOFRAN) 4 MG tablet Take 1  tablet (4 mg total) by mouth every 6 (six) hours. 12/09/13  Yes Ezequiel Essex, MD  pantoprazole (PROTONIX) 40 MG tablet Take 1 tablet (40 mg total) by mouth 2 (two) times daily. 11/28/13  Yes Angelica Chessman, MD  QUEtiapine (SEROQUEL) 100 MG tablet Take 100 mg by mouth at bedtime. For mood control 11/28/13  Yes Angelica Chessman, MD  sertraline (ZOLOFT) 25 MG tablet Take 3 tablets (75 mg total) by mouth daily. For depression 11/28/13  Yes Angelica Chessman, MD  thiamine 100 MG tablet Take 1 tablet (100 mg total) by mouth daily. 11/28/13  Yes Angelica Chessman, MD    Physical Exam: Filed Vitals:   12/25/13 2130 12/25/13 2200 12/25/13 2250 12/25/13 2330  BP: 172/103 149/78 171/106 149/97  Pulse: 107 118 110 107  Temp:      TempSrc:      Resp:      SpO2: 98% 100% 100% 100%     General:  Moderately built and nourished.  Eyes: Anicteric no pallor.  ENT: No discharge from the ears eyes nose mouth.  Neck: No mass felt.  Cardiovascular: S1-S2 heard. Tachycardic.  Respiratory: No rhonchi or crepitations.  Abdomen: Soft mild tenderness in the epigastric area. No guarding rigidity.  Skin: No rash.  Musculoskeletal: No edema.  Psychiatric: Appears normal.  Neurologic: Alert awake oriented to time place and person. Moves all extremities.  Labs on Admission:  Basic Metabolic Panel:  Recent Labs Lab 12/25/13 1631  NA 140  K 3.9  CL 99  CO2 23  GLUCOSE 111*  BUN 8  CREATININE 0.63  CALCIUM 10.3   Liver Function Tests:  Recent Labs Lab 12/25/13 1631  AST 68*  ALT 44  ALKPHOS 173*  BILITOT 0.8  PROT 9.2*  ALBUMIN 4.6    Recent Labs Lab 12/25/13 1631  LIPASE 467*   No results found for this basename: AMMONIA,  in the last 168 hours CBC:  Recent Labs Lab 12/25/13 1631  WBC 11.5*  NEUTROABS 7.6  HGB 13.3  HCT 38.6*  MCV 91.3  PLT 237   Cardiac Enzymes: No results found for this basename: CKTOTAL, CKMB, CKMBINDEX, TROPONINI,  in the last 168  hours  BNP (last 3 results) No results found for this basename: PROBNP,  in the last 8760 hours CBG: No results found for this basename: GLUCAP,  in the last 168 hours  Radiological Exams on Admission: No results found.  EKG: Independently reviewed. Normal sinus rhythm. Short PR interval.  Assessment/Plan Active Problems:   DEPRESSION   HYPERTENSION   Alcohol-induced acute pancreatitis   Acute pancreatitis   1. Abdominal pain most likely from acute on chronic alcoholic pancreatitis - patient will be kept n.p.o. except medications. Check CT abdomen and pelvis was done was more than a year ago. Patient has had a history of pseudocyst. Continue hydration and pain medication. Patient states he has not had alcohol over the last one week. Check triglycerides. 2. Tachycardia  with history of WPW syndrome - patient's heart rate presently is around 100 -110. May be related to pain. Closely watch in telemetry given history of WPW syndrome for any further worsening. Patient is on beta blocker metoprolol which will be continued. Check urine drug screen is positive for cocaine may have to discontinue metoprolol. 3. Hypertension - initially was markedly elevated but improved with pain control. Patient will be placed on when necessary IV hydralazine. 4. Depression - continue home medications. 5. Tobacco abuse and alcohol abuse - advised patient to quit this habit.    Code Status: Full code.  Family Communication: None.  Disposition Plan: Admit to inpatient.    Lovelle Lema N. [facility] Pager [phone].  If 7PM-7AM, please contact night-coverage [email or web address] Password [email or web address] 12/25/2013, 11:50 PM

## 2013-12-25 NOTE — ED Provider Notes (Signed)
CSN: [id number]     Arrival date & time 12/25/13  1622 History   First MD Initiated Contact with Patient 12/25/13 2035     Chief Complaint  Patient presents with  . Abdominal Pain     (Consider location/radiation/quality/duration/timing/severity/associated sxs/prior Treatment) HPI 43 year old male with a history of pancreatitis presents with recurrent abdominal pain that started today. He states it is in his left side of his abdomen. His been having nausea and vomiting. He states he is not had alcohol in the last 2 weeks. Patient reports the pain as a 9/10. He states he's been taking his Creon, Zofran, and protonix as instructed. Denies any fevers. Denies any urinary symptoms.  Past Medical History  Diagnosis Date  . Hypertension   . Asthma   . Pancreatitis   . Cocaine abuse   . Depression   . H/O suicide attempt   . Heart murmur     "when he was little" (03/06/2013)  . Shortness of breath     "can happen at anytime" (03/06/2013)  . Anemia   . H/O hiatal hernia   . GERD (gastroesophageal reflux disease)   . Arthritis     "knees" (03/06/2013)  . Anxiety   . WPW (Wolff-Parkinson-White syndrome)     [name] 03/06/2013   Past Surgical History  Procedure Laterality Date  . Facial fracture surgery Left 1990's    "result of trauma" (03/06/2013)  . Eye surgery Left 1990's    "result of trauma" (03/06/2013)   Family History  Problem Relation Age of Onset  . Hypertension Other   . Coronary artery disease Other    History  Substance Use Topics  . Smoking status: Current Every Day Smoker -- 1.50 packs/day for 30 years    Types: Cigarettes  . Smokeless tobacco: Current User  . Alcohol Use: Yes     Comment: 12 pk beer daily 12oz cans liquor occasionally    Review of Systems  Constitutional: Negative for fever.  Gastrointestinal: Positive for vomiting, abdominal pain and diarrhea.  Genitourinary: Negative for dysuria.  Musculoskeletal: Negative for back pain.  All other  systems reviewed and are negative.     Allergies  Trazodone and nefazodone and Shellfish-derived products  Home Medications   Prior to Admission medications   Medication Sig Start Date End Date Taking? Authorizing Provider  acetaminophen (TYLENOL) 500 MG tablet Take 1,000 mg by mouth every 6 (six) hours as needed for mild pain.   Yes Historical Provider, MD  albuterol (PROVENTIL HFA;VENTOLIN HFA) 108 (90 BASE) MCG/ACT inhaler Inhale 1-2 puffs into the lungs every 6 (six) hours as needed for wheezing or shortness of breath. 11/26/13  Yes Shanker Kristeen Mans, MD  Alum & Mag Hydroxide-Simeth (GI COCKTAIL) SUSP suspension Take 30 mLs by mouth 3 (three) times daily as needed for indigestion. Shake well. 11/28/13  Yes Angelica Chessman, MD  feeding supplement, ENSURE COMPLETE, (ENSURE COMPLETE) LIQD Take 237 mLs by mouth 2 (two) times daily between meals. 11/26/13  Yes Shanker Kristeen Mans, MD  folic acid (FOLVITE) 1 MG tablet Take 1 tablet (1 mg total) by mouth daily. 11/26/13  Yes Shanker Kristeen Mans, MD  ibuprofen (ADVIL,MOTRIN) 800 MG tablet Take 800 mg by mouth every 8 (eight) hours as needed for moderate pain.   Yes Historical Provider, MD  lipase/protease/amylase (CREON-12/PANCREASE) 12000 UNITS CPEP capsule Take 1 capsule by mouth 3 (three) times daily before meals. 11/28/13  Yes Angelica Chessman, MD  LORazepam (ATIVAN) 1 MG tablet Take 1 tablet (1 mg  total) by mouth every 8 (eight) hours as needed for anxiety. 11/28/13  Yes Angelica Chessman, MD  metoprolol (LOPRESSOR) 50 MG tablet Take 1 tablet (50 mg total) by mouth 2 (two) times daily. For high blood pressure 11/28/13  Yes Angelica Chessman, MD  Multiple Vitamin (MULTIVITAMIN WITH MINERALS) TABS tablet Take 1 tablet by mouth daily. For low vitamin 11/28/13  Yes Angelica Chessman, MD  ondansetron (ZOFRAN) 4 MG tablet Take 1 tablet (4 mg total) by mouth every 6 (six) hours. 12/09/13  Yes Ezequiel Essex, MD  pantoprazole (PROTONIX) 40 MG tablet Take 1  tablet (40 mg total) by mouth 2 (two) times daily. 11/28/13  Yes Angelica Chessman, MD  QUEtiapine (SEROQUEL) 100 MG tablet Take 100 mg by mouth at bedtime. For mood control 11/28/13  Yes Angelica Chessman, MD  sertraline (ZOLOFT) 25 MG tablet Take 3 tablets (75 mg total) by mouth daily. For depression 11/28/13  Yes Angelica Chessman, MD  thiamine 100 MG tablet Take 1 tablet (100 mg total) by mouth daily. 11/28/13  Yes Olugbemiga Jegede, MD   BP 150/111  Pulse 120  Temp(Src) 98 F (36.7 C) (Oral)  Resp 17  SpO2 100% Physical Exam  Nursing note and vitals reviewed. Constitutional: He is oriented to person, place, and time. He appears well-developed and well-nourished. No distress.  HENT:  Head: Normocephalic and atraumatic.  Right Ear: External ear normal.  Left Ear: External ear normal.  Nose: Nose normal.  Eyes: Right eye exhibits no discharge. Left eye exhibits no discharge.  Neck: Neck supple.  Cardiovascular: Regular rhythm, normal heart sounds and intact distal pulses.  Tachycardia present.   Pulmonary/Chest: Effort normal and breath sounds normal.  Abdominal: Soft. There is tenderness in the left upper quadrant.  Musculoskeletal: He exhibits no edema.  Neurological: He is alert and oriented to person, place, and time.  Skin: Skin is warm and dry.    ED Course  Procedures (including critical care time) Labs Review Labs Reviewed  CBC WITH DIFFERENTIAL - Abnormal; Notable for the following:    WBC 11.5 (*)    HCT 38.6 (*)    RDW 16.9 (*)    All other components within normal limits  COMPREHENSIVE METABOLIC PANEL - Abnormal; Notable for the following:    Glucose, Bld 111 (*)    Total Protein 9.2 (*)    AST 68 (*)    Alkaline Phosphatase 173 (*)    Anion gap 18 (*)    All other components within normal limits  LIPASE, BLOOD - Abnormal; Notable for the following:    Lipase 467 (*)    All other components within normal limits  URINALYSIS, ROUTINE W REFLEX MICROSCOPIC -  Abnormal; Notable for the following:    Color, Urine AMBER (*)    APPearance CLOUDY (*)    Specific Gravity, Urine 1.031 (*)    Bilirubin Urine SMALL (*)    Ketones, ur 15 (*)    All other components within normal limits    Imaging Review No results found.   EKG Interpretation None      MDM   Final diagnoses:  Alcohol-induced acute pancreatitis    Patient's pancreatitis appears to be poorly controlled after multiple doses of Dilaudid. He remains tachycardic after 2 L of IV fluids. Due to this combination of feel he should be admitted to the hospital for pain control and bowel rest and IV fluid hydration. We will admit to the hospitalist.    Ephraim Hamburger, MD 12/26/13 0005

## 2013-12-25 NOTE — ED Notes (Signed)
Pt presents to department for evaluation of diffuse abdominal pain. Ongoing for several days. 9/10 pain upon arrival to ED. History of pancreatitis. States he hasn't drank ETOH in several weeks. Pt is alert and oriented x4.

## 2013-12-25 NOTE — ED Notes (Signed)
Last ETOH

## 2013-12-25 NOTE — ED Notes (Signed)
Vomited x2 today

## 2013-12-26 ENCOUNTER — Encounter (HOSPITAL_COMMUNITY): Payer: Self-pay | Admitting: General Practice

## 2013-12-26 ENCOUNTER — Inpatient Hospital Stay (HOSPITAL_COMMUNITY): Payer: Self-pay

## 2013-12-26 DIAGNOSIS — K862 Cyst of pancreas: Secondary | ICD-10-CM

## 2013-12-26 DIAGNOSIS — K863 Pseudocyst of pancreas: Secondary | ICD-10-CM

## 2013-12-26 DIAGNOSIS — K859 Acute pancreatitis without necrosis or infection, unspecified: Secondary | ICD-10-CM

## 2013-12-26 LAB — HEPATIC FUNCTION PANEL
ALBUMIN: 3.6 g/dL (ref 3.5–5.2)
ALT: 29 U/L (ref 0–53)
AST: 35 U/L (ref 0–37)
Alkaline Phosphatase: 135 U/L — ABNORMAL HIGH (ref 39–117)
BILIRUBIN TOTAL: 0.6 mg/dL (ref 0.3–1.2)
Total Protein: 7.6 g/dL (ref 6.0–8.3)

## 2013-12-26 LAB — BASIC METABOLIC PANEL
Anion gap: 15 (ref 5–15)
BUN: 6 mg/dL (ref 6–23)
CALCIUM: 8.9 mg/dL (ref 8.4–10.5)
CO2: 21 meq/L (ref 19–32)
Chloride: 103 mEq/L (ref 96–112)
Creatinine, Ser: 0.52 mg/dL (ref 0.50–1.35)
GFR calc Af Amer: >90 mL/min (ref >90)
Glucose, Bld: 134 mg/dL — ABNORMAL HIGH (ref 70–99)
Potassium: 3.3 mEq/L — ABNORMAL LOW (ref 3.7–5.3)
Sodium: 139 mEq/L (ref 137–147)

## 2013-12-26 LAB — RAPID URINE DRUG SCREEN, HOSP PERFORMED
Amphetamines: NOT DETECTED
BENZODIAZEPINES: NOT DETECTED
Barbiturates: POSITIVE — AB
COCAINE: NOT DETECTED
Opiates: POSITIVE — AB
Tetrahydrocannabinol: NOT DETECTED

## 2013-12-26 LAB — TROPONIN I

## 2013-12-26 LAB — CBC WITH DIFFERENTIAL/PLATELET
Basophils Absolute: 0 10*3/uL (ref 0.0–0.1)
Basophils Relative: 0 % (ref 0–1)
EOS ABS: 0.4 10*3/uL (ref 0.0–0.7)
EOS PCT: 4 % (ref 0–5)
HEMATOCRIT: 34.4 % — AB (ref 39.0–52.0)
Hemoglobin: 11.6 g/dL — ABNORMAL LOW (ref 13.0–17.0)
LYMPHS ABS: 3 10*3/uL (ref 0.7–4.0)
Lymphocytes Relative: 29 % (ref 12–46)
MCH: 30.9 pg (ref 26.0–34.0)
MCHC: 33.7 g/dL (ref 30.0–36.0)
MCV: 91.7 fL (ref 78.0–100.0)
MONO ABS: 0.8 10*3/uL (ref 0.1–1.0)
MONOS PCT: 8 % (ref 3–12)
Neutro Abs: 6 10*3/uL (ref 1.7–7.7)
Neutrophils Relative %: 59 % (ref 43–77)
PLATELETS: 204 10*3/uL (ref 150–400)
RBC: 3.75 MIL/uL — ABNORMAL LOW (ref 4.22–5.81)
RDW: 16.7 % — ABNORMAL HIGH (ref 11.5–15.5)
WBC: 10.2 10*3/uL (ref 4.0–10.5)

## 2013-12-26 LAB — GLUCOSE, CAPILLARY
GLUCOSE-CAPILLARY: 113 mg/dL — AB (ref 70–99)
GLUCOSE-CAPILLARY: 102 mg/dL — AB (ref 70–99)
Glucose-Capillary: 105 mg/dL — ABNORMAL HIGH (ref 70–99)

## 2013-12-26 LAB — TSH: TSH: 3.04 u[IU]/mL (ref 0.350–4.500)

## 2013-12-26 LAB — LIPASE, BLOOD: Lipase: 488 U/L — ABNORMAL HIGH (ref 11–59)

## 2013-12-26 MED ORDER — PANCRELIPASE (LIP-PROT-AMYL) 12000-38000 UNITS PO CPEP
12000.0000 [IU] | ORAL_CAPSULE | Freq: Three times a day (TID) | ORAL | Status: DC
  Administered 2013-12-26 – 2013-12-31 (×17): 12000 [IU] via ORAL
  Filled 2013-12-26 (×20): qty 1

## 2013-12-26 MED ORDER — POTASSIUM CHLORIDE CRYS ER 20 MEQ PO TBCR
40.0000 meq | EXTENDED_RELEASE_TABLET | ORAL | Status: AC
  Administered 2013-12-26 – 2013-12-27 (×2): 40 meq via ORAL
  Filled 2013-12-26 (×2): qty 2

## 2013-12-26 MED ORDER — LORAZEPAM 2 MG/ML IJ SOLN
1.0000 mg | Freq: Four times a day (QID) | INTRAMUSCULAR | Status: AC | PRN
  Administered 2013-12-26: 1 mg via INTRAVENOUS

## 2013-12-26 MED ORDER — LORAZEPAM 2 MG/ML IJ SOLN: 0.0000 mg | Freq: Two times a day (BID) | INTRAMUSCULAR | Status: DC

## 2013-12-26 MED ORDER — ENSURE COMPLETE PO LIQD: 237.0000 mL | Freq: Three times a day (TID) | ORAL | Status: DC

## 2013-12-26 MED ORDER — HYDRALAZINE HCL 20 MG/ML IJ SOLN
10.0000 mg | INTRAMUSCULAR | Status: DC | PRN
  Filled 2013-12-26: qty 1

## 2013-12-26 MED ORDER — LORAZEPAM 2 MG/ML IJ SOLN: 0.0000 mg | Freq: Four times a day (QID) | INTRAMUSCULAR | Status: AC

## 2013-12-26 MED ORDER — ALBUTEROL SULFATE (2.5 MG/3ML) 0.083% IN NEBU: 3.0000 mL | INHALATION_SOLUTION | Freq: Four times a day (QID) | RESPIRATORY_TRACT | Status: DC | PRN

## 2013-12-26 MED ORDER — SODIUM CHLORIDE 0.9 % IJ SOLN
3.0000 mL | Freq: Two times a day (BID) | INTRAMUSCULAR | Status: DC
  Administered 2013-12-27 – 2013-12-31 (×7): 3 mL via INTRAVENOUS

## 2013-12-26 MED ORDER — ADULT MULTIVITAMIN W/MINERALS CH
1.0000 | ORAL_TABLET | Freq: Every day | ORAL | Status: DC
  Administered 2013-12-26 – 2013-12-31 (×6): 1 via ORAL
  Filled 2013-12-26 (×6): qty 1

## 2013-12-26 MED ORDER — LORAZEPAM 2 MG/ML IJ SOLN
0.0000 mg | Freq: Two times a day (BID) | INTRAMUSCULAR | Status: AC
  Administered 2013-12-29: 2 mg via INTRAVENOUS
  Filled 2013-12-26: qty 1

## 2013-12-26 MED ORDER — IOHEXOL 300 MG/ML  SOLN: 25.0000 mL | INTRAMUSCULAR | Status: AC

## 2013-12-26 MED ORDER — ENOXAPARIN SODIUM 40 MG/0.4ML ~~LOC~~ SOLN
40.0000 mg | SUBCUTANEOUS | Status: DC
  Administered 2013-12-26 – 2013-12-31 (×6): 40 mg via SUBCUTANEOUS
  Filled 2013-12-26 (×6): qty 0.4

## 2013-12-26 MED ORDER — FOLIC ACID 1 MG PO TABS
1.0000 mg | ORAL_TABLET | Freq: Every day | ORAL | Status: DC
  Administered 2013-12-26 – 2013-12-31 (×6): 1 mg via ORAL
  Filled 2013-12-26 (×6): qty 1

## 2013-12-26 MED ORDER — VITAMIN B-1 100 MG PO TABS
100.0000 mg | ORAL_TABLET | Freq: Every day | ORAL | Status: DC
  Administered 2013-12-26 – 2013-12-31 (×6): 100 mg via ORAL
  Filled 2013-12-26 (×6): qty 1

## 2013-12-26 MED ORDER — GI COCKTAIL ~~LOC~~
30.0000 mL | Freq: Three times a day (TID) | ORAL | Status: DC | PRN
  Administered 2013-12-26 – 2013-12-31 (×12): 30 mL via ORAL
  Filled 2013-12-26 (×12): qty 30

## 2013-12-26 MED ORDER — ONDANSETRON HCL 4 MG/2ML IJ SOLN
4.0000 mg | Freq: Four times a day (QID) | INTRAMUSCULAR | Status: DC | PRN
  Administered 2013-12-28: 4 mg via INTRAVENOUS
  Filled 2013-12-26: qty 2

## 2013-12-26 MED ORDER — SODIUM CHLORIDE 0.9 % IV SOLN
INTRAVENOUS | Status: AC
  Administered 2013-12-26 (×3): via INTRAVENOUS

## 2013-12-26 MED ORDER — ONDANSETRON HCL 4 MG PO TABS: 4.0000 mg | ORAL_TABLET | Freq: Four times a day (QID) | ORAL | Status: DC | PRN

## 2013-12-26 MED ORDER — THIAMINE HCL 100 MG/ML IJ SOLN
100.0000 mg | Freq: Every day | INTRAMUSCULAR | Status: DC
  Filled 2013-12-26 (×4): qty 1

## 2013-12-26 MED ORDER — IOHEXOL 300 MG/ML  SOLN
100.0000 mL | Freq: Once | INTRAMUSCULAR | Status: AC | PRN
  Administered 2013-12-26: 100 mL via INTRAVENOUS

## 2013-12-26 MED ORDER — LORAZEPAM 2 MG/ML IJ SOLN
0.0000 mg | Freq: Four times a day (QID) | INTRAMUSCULAR | Status: DC
  Administered 2013-12-26: 2 mg via INTRAVENOUS
  Filled 2013-12-26 (×2): qty 1

## 2013-12-26 MED ORDER — PANTOPRAZOLE SODIUM 40 MG PO TBEC
40.0000 mg | DELAYED_RELEASE_TABLET | Freq: Two times a day (BID) | ORAL | Status: DC
  Administered 2013-12-26 – 2013-12-31 (×11): 40 mg via ORAL
  Filled 2013-12-26 (×12): qty 1

## 2013-12-26 MED ORDER — QUETIAPINE FUMARATE 100 MG PO TABS
100.0000 mg | ORAL_TABLET | Freq: Every day | ORAL | Status: DC
  Administered 2013-12-26 – 2013-12-30 (×6): 100 mg via ORAL
  Filled 2013-12-26 (×7): qty 1

## 2013-12-26 MED ORDER — PNEUMOCOCCAL VAC POLYVALENT 25 MCG/0.5ML IJ INJ
0.5000 mL | INJECTION | INTRAMUSCULAR | Status: AC
  Administered 2013-12-27: 0.5 mL via INTRAMUSCULAR
  Filled 2013-12-26: qty 0.5

## 2013-12-26 MED ORDER — LORAZEPAM 1 MG PO TABS
1.0000 mg | ORAL_TABLET | Freq: Four times a day (QID) | ORAL | Status: AC | PRN
Start: 2013-12-26 — End: 2013-12-29
  Administered 2013-12-26 – 2013-12-28 (×2): 1 mg via ORAL
  Filled 2013-12-26 (×2): qty 1

## 2013-12-26 MED ORDER — METOPROLOL TARTRATE 50 MG PO TABS
50.0000 mg | ORAL_TABLET | Freq: Two times a day (BID) | ORAL | Status: DC
  Administered 2013-12-26 – 2013-12-31 (×11): 50 mg via ORAL
  Filled 2013-12-26 (×12): qty 1

## 2013-12-26 MED ORDER — SERTRALINE HCL 50 MG PO TABS
75.0000 mg | ORAL_TABLET | Freq: Every day | ORAL | Status: DC
  Administered 2013-12-26 – 2013-12-31 (×6): 75 mg via ORAL
  Filled 2013-12-26 (×6): qty 1

## 2013-12-26 NOTE — Progress Notes (Signed)
  Pt admitted to the unit. Pt is stable, alert and oriented per baseline. Oriented to room, staff, and call bell. Educated to call for any assistance. Bed in lowest position, call bell within reach- will continue to monitor. 

## 2013-12-26 NOTE — Progress Notes (Addendum)
INITIAL NUTRITION ASSESSMENT  DOCUMENTATION CODES Per approved criteria  -Severe malnutrition in the context of malnutrition  Pt meets criteria for severe MALNUTRITION in the context of chronic illness as evidenced by severe fat and muscle depletion and reported intake <75% of estimated needs for >1 month.  INTERVENTION: Once diet upgraded, add Ensure Complete po TID, each supplement provides 350 kcal and 13 grams of protein  NUTRITION DIAGNOSIS: Inadequate oral intake related to chronic pancreatitis as evidenced by wt loss and reported intake less than estimated needs.   Goal: Pt to meet >/= 90% of their estimated nutrition needs   Monitor:  Weight trend, po intake, acceptance of supplements, labs  Reason for Assessment: MST  43 y.o. male  Admitting Dx: <principal problem not specified>  ASSESSMENT: 43 y.o. male with history of chronic alcoholic pancreatitis, WPW syndrome, depression presented to the ER with complaints of abdominal pain.  - Pt reports having a poor appetite recently due to abdominal pain. He reports his usual body weight as 142 lbs, but he was unsure when he last weighed this. He reports nausea today.   Labs: Na WNL K low  Nutrition Focused Physical Exam:  Subcutaneous Fat:  Orbital Region: WNL Upper Arm Region: moderate depletion Thoracic and Lumbar Region: severe depletion  Muscle:  Temple Region: WNL Clavicle Bone Region: mild depletion Clavicle and Acromion Bone Region: mild depletion Scapular Bone Region: NA Dorsal Hand: NA Patellar Region: severe depletion Anterior Thigh Region: severe depletion Posterior Calf Region: severe depletion  Edema: none  Height: Ht Readings from Last 1 Encounters:  12/26/13 5' 10.8" (1.798 m)    Weight: Wt Readings from Last 1 Encounters:  12/26/13 128 lb 4.9 oz (58.2 kg)    Ideal Body Weight: 74.8 kg  % Ideal Body Weight: 78%  Wt Readings from Last 10 Encounters:  12/26/13 128 lb 4.9 oz (58.2 kg)   12/16/13 137 lb (62.143 kg)  12/11/13 137 lb (62.143 kg)  12/09/13 137 lb (62.143 kg)  11/28/13 132 lb (59.875 kg)  11/17/13 137 lb (62.143 kg)  10/22/13 126 lb (57.153 kg)  10/06/13 130 lb 8.2 oz (59.2 kg)  09/13/13 147 lb (66.679 kg)  08/16/13 147 lb (66.679 kg)    Usual Body Weight: 145 lbs  % Usual Body Weight: 88%  BMI:  Body mass index is 18 kg/(m^2).  Estimated Nutritional Needs: Kcal: 1750-2000 Protein: 95-110 g Fluid: 2.0 L/day  Skin: intact  Diet Order: NPO  EDUCATION NEEDS: -Education needs addressed   Intake/Output Summary (Last 24 hours) at 12/26/13 1346 Last data filed at 12/26/13 1149  Gross per 24 hour  Intake   1000 ml  Output    400 ml  Net    600 ml    Last BM: prior to admission   Labs:   Recent Labs Lab 12/25/13 1631 12/26/13 0250  NA 140 139  K 3.9 3.3*  CL 99 103  CO2 23 21  BUN 8 6  CREATININE 0.63 0.52  CALCIUM 10.3 8.9  GLUCOSE 111* 134*    CBG (last 3)   Recent Labs  12/26/13 0609 12/26/13 1218  GLUCAP 113* 105*    Scheduled Meds: . enoxaparin (LOVENOX) injection  40 mg Subcutaneous Q24H  . folic acid  1 mg Oral Daily  . lipase/protease/amylase  12,000 Units Oral TID AC  . LORazepam  0-4 mg Intravenous Q6H   Followed by  . [START ON 12/28/2013] LORazepam  0-4 mg Intravenous Q12H  . metoprolol  50 mg  Oral BID  . multivitamin with minerals  1 tablet Oral Daily  . pantoprazole  40 mg Oral BID  . [START ON 12/27/2013] pneumococcal 23 valent vaccine  0.5 mL Intramuscular Tomorrow-1000  . QUEtiapine  100 mg Oral QHS  . sertraline  75 mg Oral Daily  . sodium chloride  3 mL Intravenous Q12H  . thiamine  100 mg Oral Daily   Or  . thiamine  100 mg Intravenous Daily    Continuous Infusions: . sodium chloride 100 mL/hr at 12/26/13 1145    Past Medical History  Diagnosis Date  . Hypertension   . Asthma   . Pancreatitis   . Cocaine abuse   . Depression   . H/O suicide attempt   . Heart murmur     "when he  was little" (03/06/2013)  . Shortness of breath     "can happen at anytime" (03/06/2013)  . Anemia   . H/O hiatal hernia   . GERD (gastroesophageal reflux disease)   . Arthritis     "knees" (03/06/2013)  . Anxiety   . WPW (Wolff-Parkinson-White syndrome)     [name] 03/06/2013    Past Surgical History  Procedure Laterality Date  . Facial fracture surgery Left 1990's    "result of trauma" (03/06/2013)  . Eye surgery Left 1990's    "result of trauma" (03/06/2013)    Terrace Arabia RD, LDN

## 2013-12-26 NOTE — Progress Notes (Signed)
Utilization review completed.  

## 2013-12-26 NOTE — Consult Note (Signed)
Reason for Consult:pancreatitis, possible appendicitis Referring Physician: Dr. Denice Paradise is an 43 y.o. male.  HPI: Patient developed abdominal pain, nausea, vomiting, and diarrhea yesterday evening. He was admitted to the medical service with pancreatitis. He has had this in the past, related to alcohol. CT scan of the abdomen and pelvis was done today which demonstrated acute appendicitis with a small pseudocyst. There was also a mention of bladder wall thickening which the report stated might be due to appendicitis. He denies right lower quadrant abdominal pain. His pain is in his left upper quadrant.  Past Medical History  Diagnosis Date  . Hypertension   . Asthma   . Pancreatitis   . Cocaine abuse   . Depression   . H/O suicide attempt   . Heart murmur     "when he was little" (03/06/2013)  . Shortness of breath     "can happen at anytime" (03/06/2013)  . Anemia   . H/O hiatal hernia   . GERD (gastroesophageal reflux disease)   . Arthritis     "knees" (03/06/2013)  . Anxiety   . WPW (Wolff-Parkinson-White syndrome)     [name] 03/06/2013    Past Surgical History  Procedure Laterality Date  . Facial fracture surgery Left 1990's    "result of trauma" (03/06/2013)  . Eye surgery Left 1990's    "result of trauma" (03/06/2013)    Family History  Problem Relation Age of Onset  . Hypertension Other   . Coronary artery disease Other     Social History:  reports that he has been smoking Cigarettes.  He has a 45 pack-year smoking history. He uses smokeless tobacco. He reports that he drinks alcohol. He reports that he does not use illicit drugs.  Allergies:  Allergies  Allergen Reactions  . Trazodone And Nefazodone Other (See Comments)    Muscle spasms  . Shellfish-Derived Products Nausea And Vomiting    Medications:  Prior to Admission:  Prescriptions prior to admission  Medication Sig Dispense Refill  . acetaminophen (TYLENOL) 500 MG tablet Take  1,000 mg by mouth every 6 (six) hours as needed for mild pain.      [name] albuterol (PROVENTIL HFA;VENTOLIN HFA) 108 (90 BASE) MCG/ACT inhaler Inhale 1-2 puffs into the lungs every 6 (six) hours as needed for wheezing or shortness of breath.  1 Inhaler  0  . Alum & Mag Hydroxide-Simeth (GI COCKTAIL) SUSP suspension Take 30 mLs by mouth 3 (three) times daily as needed for indigestion. Shake well.  500 mL  3  . feeding supplement, ENSURE COMPLETE, (ENSURE COMPLETE) LIQD Take 237 mLs by mouth 2 (two) times daily between meals.  60 Bottle  0  . folic acid (FOLVITE) 1 MG tablet Take 1 tablet (1 mg total) by mouth daily.  30 tablet  0  . ibuprofen (ADVIL,MOTRIN) 800 MG tablet Take 800 mg by mouth every 8 (eight) hours as needed for moderate pain.      [name] lipase/protease/amylase (CREON-12/PANCREASE) 12000 UNITS CPEP capsule Take 1 capsule by mouth 3 (three) times daily before meals.  270 capsule  3  . LORazepam (ATIVAN) 1 MG tablet Take 1 tablet (1 mg total) by mouth every 8 (eight) hours as needed for anxiety.  30 tablet  0  . metoprolol (LOPRESSOR) 50 MG tablet Take 1 tablet (50 mg total) by mouth 2 (two) times daily. For high blood pressure  180 tablet  3  . Multiple Vitamin (MULTIVITAMIN WITH MINERALS) TABS tablet Take 1  tablet by mouth daily. For low vitamin  90 tablet  3  . ondansetron (ZOFRAN) 4 MG tablet Take 1 tablet (4 mg total) by mouth every 6 (six) hours.  12 tablet  0  . pantoprazole (PROTONIX) 40 MG tablet Take 1 tablet (40 mg total) by mouth 2 (two) times daily.  60 tablet  3  . QUEtiapine (SEROQUEL) 100 MG tablet Take 100 mg by mouth at bedtime. For mood control      . sertraline (ZOLOFT) 25 MG tablet Take 3 tablets (75 mg total) by mouth daily. For depression  90 tablet  3  . thiamine 100 MG tablet Take 1 tablet (100 mg total) by mouth daily.  90 tablet  3    Results for orders placed during the hospital encounter of 12/25/13 (from the past 48 hour(s))  CBC WITH DIFFERENTIAL     Status:  Abnormal   Collection Time    12/25/13  4:31 PM      Result Value Ref Range   WBC 11.5 (*) 4.0 - 10.5 K/uL   RBC 4.23  4.22 - 5.81 MIL/uL   Hemoglobin 13.3  13.0 - 17.0 g/dL   HCT 38.6 (*) 39.0 - 52.0 %   MCV 91.3  78.0 - 100.0 fL   MCH 31.4  26.0 - 34.0 pg   MCHC 34.5  30.0 - 36.0 g/dL   RDW 16.9 (*) 11.5 - 15.5 %   Platelets 237  150 - 400 K/uL   Neutrophils Relative % 67  43 - 77 %   Neutro Abs 7.6  1.7 - 7.7 K/uL   Lymphocytes Relative 23  12 - 46 %   Lymphs Abs 2.6  0.7 - 4.0 K/uL   Monocytes Relative 6  3 - 12 %   Monocytes Absolute 0.7  0.1 - 1.0 K/uL   Eosinophils Relative 4  0 - 5 %   Eosinophils Absolute 0.4  0.0 - 0.7 K/uL   Basophils Relative 0  0 - 1 %   Basophils Absolute 0.0  0.0 - 0.1 K/uL  COMPREHENSIVE METABOLIC PANEL     Status: Abnormal   Collection Time    12/25/13  4:31 PM      Result Value Ref Range   Sodium 140  137 - 147 mEq/L   Potassium 3.9  3.7 - 5.3 mEq/L   Chloride 99  96 - 112 mEq/L   CO2 23  19 - 32 mEq/L   Glucose, Bld 111 (*) 70 - 99 mg/dL   BUN 8  6 - 23 mg/dL   Creatinine, Ser 0.63  0.50 - 1.35 mg/dL   Calcium 10.3  8.4 - 10.5 mg/dL   Total Protein 9.2 (*) 6.0 - 8.3 g/dL   Albumin 4.6  3.5 - 5.2 g/dL   AST 68 (*) 0 - 37 U/L   ALT 44  0 - 53 U/L   Alkaline Phosphatase 173 (*) 39 - 117 U/L   Total Bilirubin 0.8  0.3 - 1.2 mg/dL   GFR calc non Af Amer >90  >90 mL/min   GFR calc Af Amer >90  >90 mL/min   Comment: (NOTE)     The eGFR has been calculated using the CKD EPI equation.     This calculation has not been validated in all clinical situations.     eGFR's persistently <90 mL/min signify possible Chronic Kidney     Disease.   Anion gap 18 (*) 5 - 15  LIPASE, BLOOD  Status: Abnormal   Collection Time    12/25/13  4:31 PM      Result Value Ref Range   Lipase 467 (*) 11 - 59 U/L  URINALYSIS, ROUTINE W REFLEX MICROSCOPIC     Status: Abnormal   Collection Time    12/25/13  4:49 PM      Result Value Ref Range   Color, Urine  AMBER (*) YELLOW   Comment: BIOCHEMICALS MAY BE AFFECTED BY COLOR   APPearance CLOUDY (*) CLEAR   Specific Gravity, Urine 1.031 (*) 1.005 - 1.030   pH 5.0  5.0 - 8.0   Glucose, UA NEGATIVE  NEGATIVE mg/dL   Hgb urine dipstick NEGATIVE  NEGATIVE   Bilirubin Urine SMALL (*) NEGATIVE   Ketones, ur 15 (*) NEGATIVE mg/dL   Protein, ur NEGATIVE  NEGATIVE mg/dL   Urobilinogen, UA 0.2  0.0 - 1.0 mg/dL   Nitrite NEGATIVE  NEGATIVE   Leukocytes, UA NEGATIVE  NEGATIVE   Comment: MICROSCOPIC NOT DONE ON URINES WITH NEGATIVE PROTEIN, BLOOD, LEUKOCYTES, NITRITE, OR GLUCOSE <1000 mg/dL.  TSH     Status: None   Collection Time    12/26/13  2:00 AM      Result Value Ref Range   TSH 3.040  0.350 - 4.500 uIU/mL  TROPONIN I     Status: None   Collection Time    12/26/13  2:50 AM      Result Value Ref Range   Troponin I <0.30  <0.30 ng/mL   Comment:            Due to the release kinetics of cTnI,     a negative result within the first hours     of the onset of symptoms does not rule out     myocardial infarction with certainty.     If myocardial infarction is still suspected,     repeat the test at appropriate intervals.  HEPATIC FUNCTION PANEL     Status: Abnormal   Collection Time    12/26/13  2:50 AM      Result Value Ref Range   Total Protein 7.6  6.0 - 8.3 g/dL   Albumin 3.6  3.5 - 5.2 g/dL   AST 35  0 - 37 U/L   ALT 29  0 - 53 U/L   Alkaline Phosphatase 135 (*) 39 - 117 U/L   Total Bilirubin 0.6  0.3 - 1.2 mg/dL   Bilirubin, Direct <0.2  0.0 - 0.3 mg/dL   Indirect Bilirubin NOT CALCULATED  0.3 - 0.9 mg/dL  LIPASE, BLOOD     Status: Abnormal   Collection Time    12/26/13  2:50 AM      Result Value Ref Range   Lipase 488 (*) 11 - 59 U/L  BASIC METABOLIC PANEL     Status: Abnormal   Collection Time    12/26/13  2:50 AM      Result Value Ref Range   Sodium 139  137 - 147 mEq/L   Potassium 3.3 (*) 3.7 - 5.3 mEq/L   Chloride 103  96 - 112 mEq/L   CO2 21  19 - 32 mEq/L   Glucose,  Bld 134 (*) 70 - 99 mg/dL   BUN 6  6 - 23 mg/dL   Creatinine, Ser 0.52  0.50 - 1.35 mg/dL   Calcium 8.9  8.4 - 10.5 mg/dL   GFR calc non Af Amer >90  >90 mL/min   GFR calc Af Amer >90  >  90 mL/min   Comment: (NOTE)     The eGFR has been calculated using the CKD EPI equation.     This calculation has not been validated in all clinical situations.     eGFR's persistently <90 mL/min signify possible Chronic Kidney     Disease.   Anion gap 15  5 - 15  CBC WITH DIFFERENTIAL     Status: Abnormal   Collection Time    12/26/13  2:50 AM      Result Value Ref Range   WBC 10.2  4.0 - 10.5 K/uL   RBC 3.75 (*) 4.22 - 5.81 MIL/uL   Hemoglobin 11.6 (*) 13.0 - 17.0 g/dL   HCT 58.3 (*) 58.1 - 95.2 %   MCV 91.7  78.0 - 100.0 fL   MCH 30.9  26.0 - 34.0 pg   MCHC 33.7  30.0 - 36.0 g/dL   RDW 34.4 (*) 38.1 - 46.2 %   Platelets 204  150 - 400 K/uL   Neutrophils Relative % 59  43 - 77 %   Neutro Abs 6.0  1.7 - 7.7 K/uL   Lymphocytes Relative 29  12 - 46 %   Lymphs Abs 3.0  0.7 - 4.0 K/uL   Monocytes Relative 8  3 - 12 %   Monocytes Absolute 0.8  0.1 - 1.0 K/uL   Eosinophils Relative 4  0 - 5 %   Eosinophils Absolute 0.4  0.0 - 0.7 K/uL   Basophils Relative 0  0 - 1 %   Basophils Absolute 0.0  0.0 - 0.1 K/uL  URINE RAPID DRUG SCREEN (HOSP PERFORMED)     Status: Abnormal   Collection Time    12/26/13  2:56 AM      Result Value Ref Range   Opiates POSITIVE (*) NONE DETECTED   Cocaine NONE DETECTED  NONE DETECTED   Benzodiazepines NONE DETECTED  NONE DETECTED   Amphetamines NONE DETECTED  NONE DETECTED   Tetrahydrocannabinol NONE DETECTED  NONE DETECTED   Barbiturates POSITIVE (*) NONE DETECTED   Comment:            DRUG SCREEN FOR MEDICAL PURPOSES     ONLY.  IF CONFIRMATION IS NEEDED     FOR ANY PURPOSE, NOTIFY LAB     WITHIN 5 DAYS.                LOWEST DETECTABLE LIMITS     FOR URINE DRUG SCREEN     Drug Class       Cutoff (ng/mL)     Amphetamine      1000     Barbiturate      200      Benzodiazepine   200     Tricyclics       300     Opiates          300     Cocaine          300     THC              50  GLUCOSE, CAPILLARY     Status: Abnormal   Collection Time    12/26/13  6:09 AM      Result Value Ref Range   Glucose-Capillary 113 (*) 70 - 99 mg/dL   Comment 1 Notify RN    GLUCOSE, CAPILLARY     Status: Abnormal   Collection Time    12/26/13 12:18 PM      Result Value  Ref Range   Glucose-Capillary 105 (*) 70 - 99 mg/dL  GLUCOSE, CAPILLARY     Status: Abnormal   Collection Time    12/26/13  6:08 PM      Result Value Ref Range   Glucose-Capillary 102 (*) 70 - 99 mg/dL    Ct Abdomen Pelvis W Contrast  12/26/2013   CLINICAL DATA:  Acute pancreatitis  EXAM: CT ABDOMEN AND PELVIS WITH CONTRAST  TECHNIQUE: Multidetector CT imaging of the abdomen and pelvis was performed using the standard protocol following bolus administration of intravenous contrast. Oral contrast was also administered.  CONTRAST:  129mL OMNIPAQUE IOHEXOL 300 MG/ML  SOLN  COMPARISON:  August 17, 2012  FINDINGS: Lung bases are clear.  Liver is prominent, measuring 17.9 cm in length. There is hepatic steatosis. No focal liver lesions are identified. There is no biliary duct dilatation. Gallbladder wall is not thickened.  Spleen and adrenals appear normal.  There is moderate inflammatory change surrounding the pancreas, primarily in the head and body regions. There is edema in the uncinate process of the pancreas. There is an apparent pseudocyst in the uncinate process of the pancreas measuring 2.1 by 0.8 cm. There are scattered areas of focal pancreatic duct dilatation. There is no calcification or non cystic mass in the pancreas. Inflammation is seen abutting the second and third portions of the duodenum adjacent to the pancreas with mild wall thickening in this area consistent with a degree of duodenitis.  Kidneys bilaterally show no evidence of mass or hydronephrosis. There is no renal or ureteral calculus on  either side.  In the pelvis, there is mild wall thickening of the urinary bladder. There is no pelvic mass or fluid collection. The appendix appears normal. Terminal ileum appears normal.  There is no appreciable bowel obstruction. No free air or portal venous air.  There is no ascites, adenopathy, or abscess in the abdomen or pelvis. There is atherosclerotic change in the aorta and iliac arteries without aneurysm. There is degenerative change at L5-S1 with vacuum phenomenon at this level. There are no blastic or lytic bone lesions. There is broad-based disc bulging at L4-5 and L5-S1.  IMPRESSION: Evidence of acute pancreatitis with mild peripancreatic fluid an apparent small pseudocyst in the uncinate process region. There is adjacent duodenitis due to the pancreatitis immediately adjacent to the duodenum in the second and third portions.  Appendix appears normal. No bowel obstruction. No abscess. No renal or ureteral calculus. No hydronephrosis.  Mild thickening in urinary bladder wall raises question of a degree of appendicitis.  Liver enlarged with hepatic steatosis.   Electronically Signed   By: Lowella Grip M.D.   On: 12/26/2013 11:41    Review of Systems  Constitutional: Positive for malaise/fatigue.  HENT: Negative.   Eyes: Negative.   Respiratory: Negative.   Cardiovascular: Negative.   Gastrointestinal: Positive for nausea, vomiting, abdominal pain and diarrhea.  Genitourinary: Negative.   Musculoskeletal: Negative.   Skin: Negative.   Neurological: Negative.   Endo/Heme/Allergies: Negative.   Psychiatric/Behavioral: Negative.    Blood pressure 128/83, pulse 84, temperature 97.6 F (36.4 C), temperature source Oral, resp. rate 18, height 5' 10.8" (1.798 m), weight 128 lb 4.9 oz (58.2 kg), SpO2 99.00%. Physical Exam  Constitutional: He is oriented to person, place, and time. He appears well-developed and well-nourished. No distress.  HENT:  Head: Normocephalic and atraumatic.   Mouth/Throat: Oropharynx is clear and moist. No oropharyngeal exudate.  Eyes: EOM are normal. Pupils are equal, round, and reactive  to light. No scleral icterus.  Neck: Normal range of motion. Neck supple. No tracheal deviation present.  Cardiovascular: Normal rate, normal heart sounds and intact distal pulses.   Respiratory: Effort normal and breath sounds normal. No respiratory distress. He has no wheezes.  GI: Soft. He exhibits no distension. There is tenderness. There is no rebound and no guarding.  Tender left upper quadrant without guarding, no right lower quadrant tenderness, no other tenderness, very active bowel sounds  Musculoskeletal: Normal range of motion.  Neurological: He is alert and oriented to person, place, and time.  Skin: Skin is warm and dry.  Psychiatric: He has a normal mood and affect.    Assessment/Plan: 1. Acute alcoholic pancreatitis with small pseudocyst - agree with IV fluids and bowel rest. Check amylase and lipase in a.m. No acute surgical intervention.  2. Mention of appendicitis in his CT report is a typo. I reviewed his CT scan with radiology and it should have said "cystitis". Dr. Leonia Reeves will place an addendum to the report.  Jeannene Tschetter E 12/26/2013, 8:02 PM

## 2013-12-26 NOTE — Care Management Note (Signed)
    Page 1 of 1   12/31/2013     5:22:04 PM CARE MANAGEMENT NOTE 12/31/2013  Patient:  [name], [name]   Account Number:  [id number]  Date Initiated:  12/26/2013  Documentation initiated by:  Tomi Bamberger  Subjective/Objective Assessment:   dx pancratitis  admit- lives with family.     Action/Plan:   Anticipated DC Date:  12/31/2013   Anticipated DC Plan:  [place]  CM consult  [place] [facility]      Choice offered to / List presented to:             Status of service:  Completed, signed off Medicare Important Message given?  NO (If response is "NO", the following Medicare IM given date fields will be blank) Date Medicare IM given:   Medicare IM given by:   Date Additional Medicare IM given:   Additional Medicare IM given by:    Discharge Disposition:  HOME/SELF CARE  Per UR Regulation:  Reviewed for med. necessity/level of care/duration of stay  If discussed at Icard of Stay Meetings, dates discussed:    Comments:  12/31/13 [place], BSN [phone] patient for dc today, has f/u apt at [facility] clinic, will need to fill out forms for pass for Creon medication.  Patien to go to outpt pharmacy to get creon on [place], for override.  12/26/13 [place], [place] patient  with pancreatitis, npo with sips for meds, conts on ivf's and pain iv meds.  NCM will continue to folllow for dc needs.

## 2013-12-26 NOTE — Progress Notes (Signed)
Patient b/p high- RN stated that he would speak to MD before bringing patient. RN spoke with ED charge nurse- charge nurse verified that MD was in room with patient and RN would receive call back soon.

## 2013-12-26 NOTE — Progress Notes (Signed)
[facility] PROGRESS NOTE  KIPPER BUCH PFX:[id number] DOB: 1970/08/31 DOA: 12/25/2013 PCP: Angelica Chessman, MD  Assessment/Plan: 1. Acute on chronic alcoholic pancreatitis -CT scan of abdomen and pelvis with evidence of acute pancreatitis and mild peripancreatic fluid an apparent small pseudocyst in the uncinate process region. Adjacent duodenitis noted. Mild thickening urinary bladder wall raises question of a degree of appendicitis -I have consulted surgery for input/further recommendations -Continue current conservative management>> and follow. -Recheck lipase in a.m. 2.Tachycardia with history of WPW syndrome -watch in telemetry given history of WPW syndrome for any further worsening. -Continue beta blocker metoprolol  -urine drug screen is positive for  barbiturates and opiates only. 3. Hypertension  -Better controlled, continue beta blocker and when necessary hydralazine 4.depression - continue home medications.  5.Tobacco abuse and alcohol abuse - advised patient to quit this habit on admission. 6. Hypokalemia -Replace K.  Code Status: Full Family Communication: : Bedside Disposition Plan: To home when medically ready   Consultants:  None  Procedures:  None  Antibiotics:  None   HPI/Subjective: Still with epigastric and left upper quadrant pain as well as nausea, denies vomiting.  Objective: Filed Vitals:   12/26/13 0607  BP: 126/78  Pulse: 112  Temp:   Resp:     Intake/Output Summary (Last 24 hours) at 12/26/13 1148 Last data filed at 12/26/13 0800  Gross per 24 hour  Intake   1000 ml  Output      0 ml  Net   1000 ml   Filed Weights   12/26/13 0112  Weight: 58.2 kg (128 lb 4.9 oz)    Exam:  General: alert & oriented x3 In NAD* Cardiovascular: RRR, nl S1 s2 Respiratory: CTAB Abdomen: soft +BS epigastric and left upper quadrant tenderness present, no rebound. No right lower quadrant tenderness. Extremities: No cyanosis and no  edema    Data Reviewed: Basic Metabolic Panel:  Recent Labs Lab 12/25/13 1631 12/26/13 0250  NA 140 139  K 3.9 3.3*  CL 99 103  CO2 23 21  GLUCOSE 111* 134*  BUN 8 6  CREATININE 0.63 0.52  CALCIUM 10.3 8.9   Liver Function Tests:  Recent Labs Lab 12/25/13 1631 12/26/13 0250  AST 68* 35  ALT 44 29  ALKPHOS 173* 135*  BILITOT 0.8 0.6  PROT 9.2* 7.6  ALBUMIN 4.6 3.6    Recent Labs Lab 12/25/13 1631 12/26/13 0250  LIPASE 467* 488*   No results found for this basename: AMMONIA,  in the last 168 hours CBC:  Recent Labs Lab 12/25/13 1631 12/26/13 0250  WBC 11.5* 10.2  NEUTROABS 7.6 6.0  HGB 13.3 11.6*  HCT 38.6* 34.4*  MCV 91.3 91.7  PLT 237 204   Cardiac Enzymes:  Recent Labs Lab 12/26/13 0250  TROPONINI <0.30   BNP (last 3 results) No results found for this basename: PROBNP,  in the last 8760 hours CBG:  Recent Labs Lab 12/26/13 0609  GLUCAP 113*    No results found for this or any previous visit (from the past 240 hour(s)).   Studies: Ct Abdomen Pelvis W Contrast  12/26/2013   CLINICAL DATA:  Acute pancreatitis  EXAM: CT ABDOMEN AND PELVIS WITH CONTRAST  TECHNIQUE: Multidetector CT imaging of the abdomen and pelvis was performed using the standard protocol following bolus administration of intravenous contrast. Oral contrast was also administered.  CONTRAST:  158mL OMNIPAQUE IOHEXOL 300 MG/ML  SOLN  COMPARISON:  August 17, 2012  FINDINGS: Lung bases are clear.  Liver  is prominent, measuring 17.9 cm in length. There is hepatic steatosis. No focal liver lesions are identified. There is no biliary duct dilatation. Gallbladder wall is not thickened.  Spleen and adrenals appear normal.  There is moderate inflammatory change surrounding the pancreas, primarily in the head and body regions. There is edema in the uncinate process of the pancreas. There is an apparent pseudocyst in the uncinate process of the pancreas measuring 2.1 by 0.8 cm. There are  scattered areas of focal pancreatic duct dilatation. There is no calcification or non cystic mass in the pancreas. Inflammation is seen abutting the second and third portions of the duodenum adjacent to the pancreas with mild wall thickening in this area consistent with a degree of duodenitis.  Kidneys bilaterally show no evidence of mass or hydronephrosis. There is no renal or ureteral calculus on either side.  In the pelvis, there is mild wall thickening of the urinary bladder. There is no pelvic mass or fluid collection. The appendix appears normal. Terminal ileum appears normal.  There is no appreciable bowel obstruction. No free air or portal venous air.  There is no ascites, adenopathy, or abscess in the abdomen or pelvis. There is atherosclerotic change in the aorta and iliac arteries without aneurysm. There is degenerative change at L5-S1 with vacuum phenomenon at this level. There are no blastic or lytic bone lesions. There is broad-based disc bulging at L4-5 and L5-S1.  IMPRESSION: Evidence of acute pancreatitis with mild peripancreatic fluid an apparent small pseudocyst in the uncinate process region. There is adjacent duodenitis due to the pancreatitis immediately adjacent to the duodenum in the second and third portions.  Appendix appears normal. No bowel obstruction. No abscess. No renal or ureteral calculus. No hydronephrosis.  Mild thickening in urinary bladder wall raises question of a degree of appendicitis.  Liver enlarged with hepatic steatosis.   Electronically Signed   By: Lowella Grip M.D.   On: 12/26/2013 11:41    Scheduled Meds: . enoxaparin (LOVENOX) injection  40 mg Subcutaneous Q24H  . folic acid  1 mg Oral Daily  . lipase/protease/amylase  12,000 Units Oral TID AC  . LORazepam  0-4 mg Intravenous Q6H   Followed by  . [START ON 12/28/2013] LORazepam  0-4 mg Intravenous Q12H  . metoprolol  50 mg Oral BID  . multivitamin with minerals  1 tablet Oral Daily  . pantoprazole  40  mg Oral BID  . [START ON 12/27/2013] pneumococcal 23 valent vaccine  0.5 mL Intramuscular Tomorrow-1000  . QUEtiapine  100 mg Oral QHS  . sertraline  75 mg Oral Daily  . sodium chloride  3 mL Intravenous Q12H  . thiamine  100 mg Oral Daily   Or  . thiamine  100 mg Intravenous Daily   Continuous Infusions: . sodium chloride 100 mL/hr at 12/26/13 1145    Active Problems:   DEPRESSION   HYPERTENSION   Alcohol-induced acute pancreatitis   Acute pancreatitis    Time spent: [facility] Pager [phone]. If 7PM-7AM, please contact night-coverage at [email or web address], password [facility] 12/26/2013, 11:48 AM  LOS: 1 day

## 2013-12-27 DIAGNOSIS — I456 Pre-excitation syndrome: Secondary | ICD-10-CM

## 2013-12-27 LAB — GLUCOSE, CAPILLARY
GLUCOSE-CAPILLARY: 88 mg/dL (ref 70–99)
GLUCOSE-CAPILLARY: 123 mg/dL — AB (ref 70–99)
GLUCOSE-CAPILLARY: 140 mg/dL — AB (ref 70–99)
Glucose-Capillary: 117 mg/dL — ABNORMAL HIGH (ref 70–99)
Glucose-Capillary: 86 mg/dL (ref 70–99)

## 2013-12-27 LAB — COMPREHENSIVE METABOLIC PANEL
ALBUMIN: 3.2 g/dL — AB (ref 3.5–5.2)
ALK PHOS: 126 U/L — AB (ref 39–117)
ALT: 34 U/L (ref 0–53)
AST: 46 U/L — AB (ref 0–37)
Anion gap: 12 (ref 5–15)
BUN: 3 mg/dL — ABNORMAL LOW (ref 6–23)
CHLORIDE: 105 meq/L (ref 96–112)
CO2: 23 mEq/L (ref 19–32)
Calcium: 9.2 mg/dL (ref 8.4–10.5)
Creatinine, Ser: 0.5 mg/dL (ref 0.50–1.35)
GFR calc Af Amer: >90 mL/min (ref >90)
Glucose, Bld: 80 mg/dL (ref 70–99)
POTASSIUM: 4.2 meq/L (ref 3.7–5.3)
SODIUM: 140 meq/L (ref 137–147)
Total Bilirubin: 0.5 mg/dL (ref 0.3–1.2)
Total Protein: 7 g/dL (ref 6.0–8.3)

## 2013-12-27 LAB — LIPASE, BLOOD: LIPASE: 177 U/L — AB (ref 11–59)

## 2013-12-27 MED ORDER — NICOTINE 14 MG/24HR TD PT24
14.0000 mg | MEDICATED_PATCH | TRANSDERMAL | Status: DC
  Administered 2013-12-27 – 2013-12-31 (×5): 14 mg via TRANSDERMAL
  Filled 2013-12-27 (×5): qty 1

## 2013-12-27 NOTE — Progress Notes (Signed)
[facility] Progress Note     Subjective: Continues to have abdominal pain in the epigastrium.  No N/V, not really hungry.  Ambulating well OOB.  Understands his alcohol use is the cause of this, but says he's hasn't had any alcohol for 1 week.  Objective: Vital signs in last 24 hours: Temp:  [97.6 F (36.4 C)-97.7 F (36.5 C)] 97.7 F (36.5 C) (08/21 0517) Pulse Rate:  [84-89] 89 (08/21 0517) Resp:  [18] 18 (08/21 0517) BP: (117-138)/(78-84) 117/78 mmHg (08/21 0517) SpO2:  [96 %-99 %] 96 % (08/21 0517) Last BM Date: 12/26/13  Intake/Output from previous day: 08/20 0701 - 08/21 0700 In: 200 [I.V.:200] Out: 1550 [Urine:1550] Intake/Output this shift:    PE: Gen:  Alert, NAD, pleasant Abd: Soft, ND, tender in epigastrium, +BS, no HSM   Lab Results:   Recent Labs  12/25/13 1631 12/26/13 0250  WBC 11.5* 10.2  HGB 13.3 11.6*  HCT 38.6* 34.4*  PLT 237 204   BMET  Recent Labs  12/26/13 0250 12/27/13 0601  NA 139 140  K 3.3* 4.2  CL 103 105  CO2 21 23  GLUCOSE 134* 80  BUN 6 3*  CREATININE 0.52 0.50  CALCIUM 8.9 9.2   PT/INR No results found for this basename: LABPROT, INR,  in the last 72 hours CMP     Component Value Date/Time   NA 140 12/27/2013 0601   K 4.2 12/27/2013 0601   CL 105 12/27/2013 0601   CO2 23 12/27/2013 0601   GLUCOSE 80 12/27/2013 0601   BUN 3* 12/27/2013 0601   CREATININE 0.50 12/27/2013 0601   CREATININE 0.65 11/08/2012 1226   CALCIUM 9.2 12/27/2013 0601   PROT 7.0 12/27/2013 0601   ALBUMIN 3.2* 12/27/2013 0601   AST 46* 12/27/2013 0601   ALT 34 12/27/2013 0601   ALKPHOS 126* 12/27/2013 0601   BILITOT 0.5 12/27/2013 0601   GFRNONAA >90 12/27/2013 0601   GFRNONAA >89 11/08/2012 1226   GFRAA >90 12/27/2013 0601   GFRAA >89 11/08/2012 1226   Lipase     Component Value Date/Time   LIPASE 177* 12/27/2013 0601       Studies/Results: Ct Abdomen Pelvis W Contrast  12/26/2013   ADDENDUM REPORT: 12/26/2013 20:05  ADDENDUM: Voice  recognition error: The third impression should read "Mild thickening in urinary bladder wall raises question of a degree of cystitis". Original dictation by Dr. Jasmine December, addendum by Dr. Leonia Reeves  Findings conveyed toBurke Thompsonon 12/26/2013  at20:04.   Electronically Signed   By: Suzy Bouchard M.D.   On: 12/26/2013 20:05   12/26/2013   CLINICAL DATA:  Acute pancreatitis  EXAM: CT ABDOMEN AND PELVIS WITH CONTRAST  TECHNIQUE: Multidetector CT imaging of the abdomen and pelvis was performed using the standard protocol following bolus administration of intravenous contrast. Oral contrast was also administered.  CONTRAST:  186mL OMNIPAQUE IOHEXOL 300 MG/ML  SOLN  COMPARISON:  August 17, 2012  FINDINGS: Lung bases are clear.  Liver is prominent, measuring 17.9 cm in length. There is hepatic steatosis. No focal liver lesions are identified. There is no biliary duct dilatation. Gallbladder wall is not thickened.  Spleen and adrenals appear normal.  There is moderate inflammatory change surrounding the pancreas, primarily in the head and body regions. There is edema in the uncinate process of the pancreas. There is an apparent pseudocyst in the uncinate process of the pancreas measuring 2.1 by 0.8 cm. There are scattered areas of focal pancreatic duct dilatation. There  is no calcification or non cystic mass in the pancreas. Inflammation is seen abutting the second and third portions of the duodenum adjacent to the pancreas with mild wall thickening in this area consistent with a degree of duodenitis.  Kidneys bilaterally show no evidence of mass or hydronephrosis. There is no renal or ureteral calculus on either side.  In the pelvis, there is mild wall thickening of the urinary bladder. There is no pelvic mass or fluid collection. The appendix appears normal. Terminal ileum appears normal.  There is no appreciable bowel obstruction. No free air or portal venous air.  There is no ascites, adenopathy, or abscess in the  abdomen or pelvis. There is atherosclerotic change in the aorta and iliac arteries without aneurysm. There is degenerative change at L5-S1 with vacuum phenomenon at this level. There are no blastic or lytic bone lesions. There is broad-based disc bulging at L4-5 and L5-S1.  IMPRESSION: Evidence of acute pancreatitis with mild peripancreatic fluid an apparent small pseudocyst in the uncinate process region. There is adjacent duodenitis due to the pancreatitis immediately adjacent to the duodenum in the second and third portions.  Appendix appears normal. No bowel obstruction. No abscess. No renal or ureteral calculus. No hydronephrosis.  Mild thickening in urinary bladder wall raises question of a degree of appendicitis.  Liver enlarged with hepatic steatosis.  Electronically Signed: By: Lowella Grip M.D. On: 12/26/2013 11:41    Anti-infectives: Anti-infectives   None       Assessment/Plan Acute on chronic alcoholic pancreatis with small pseudocyst  APPENDICITIS WAS A TYPO ON CT REPORT - HE DOES NOT HAVE APPENDICITIS  Plan: 1.  IVF, pain control, antiemetics, no antibiotics needed 2.  Ambulate and IS 3.  SCD's and  4.  NPO until pain nearly resolved, if pain persists may need PANDA tube 5.  No surgical interventions needed, educated on abstaining from alcohol 6.  Will sign off, call with questions/concerns     LOS: 2 days    Coralie Keens 12/27/2013, 9:23 AM Pager: [phone]

## 2013-12-27 NOTE — Progress Notes (Signed)
[facility] PROGRESS NOTE  [name] YWV:[id number] DOB: [birth year] DOA: 12/25/2013 PCP: Angelica Chessman, MD  Assessment/Plan: 1. Acute on chronic alcoholic pancreatitis -CT scan of abdomen and pelvis with evidence of acute pancreatitis and mild peripancreatic fluid an apparent small pseudocyst in the uncinate process region. Adjacent duodenitis noted. Mild thickening urinary bladder wall raises question of a degree of appendicitis -Appreciate surgery input>> no evidence of appendicitis -No surgical intervention for pseudocyst -Continue current conservative management>> lipase trending down>> will start clears -Recheck lipase in a.m. 2.Tachycardia with history of WPW syndrome -watch in telemetry given history of WPW syndrome for any further worsening. -Continue beta blocker metoprolol  -urine drug screen is positive for  barbiturates and opiates only. 3. Hypertension  -Better controlled, continue beta blocker and when necessary hydralazine 4.depression - continue home medications.  5.Tobacco abuse and alcohol abuse - advised patient to quit this habit on admission. 6. Hypokalemia -Replace K. 7. Severe protein calorie malnutrition Code Status: Full Family Communication: : Bedside Disposition Plan: To home when medically ready   Consultants:  None  Procedures:  None  Antibiotics:  None   HPI/Subjective: Status abdominal pain slightly less, denies vomiting. Wants to try liquids  Objective: Filed Vitals:   12/27/13 1013  BP: 136/89  Pulse: 92  Temp:   Resp:     Intake/Output Summary (Last 24 hours) at 12/27/13 1439 Last data filed at 12/27/13 0253  Gross per 24 hour  Intake    200 ml  Output   1150 ml  Net   -950 ml   Filed Weights   12/26/13 0112  Weight: 58.2 kg (128 lb 4.9 oz)    Exam:  General: alert & oriented x3 In NAD* Cardiovascular: RRR, nl S1 s2 Respiratory: CTAB Abdomen: soft +BS decreasing abdominal tenderness, no rebound. No  right lower quadrant tenderness. Extremities: No cyanosis and no edema    Data Reviewed: Basic Metabolic Panel:  Recent Labs Lab 12/25/13 1631 12/26/13 0250 12/27/13 0601  NA 140 139 140  K 3.9 3.3* 4.2  CL 99 103 105  CO2 23 21 23   GLUCOSE 111* 134* 80  BUN 8 6 3*  CREATININE 0.63 0.52 0.50  CALCIUM 10.3 8.9 9.2   Liver Function Tests:  Recent Labs Lab 12/25/13 1631 12/26/13 0250 12/27/13 0601  AST 68* 35 46*  ALT 44 29 34  ALKPHOS 173* 135* 126*  BILITOT 0.8 0.6 0.5  PROT 9.2* 7.6 7.0  ALBUMIN 4.6 3.6 3.2*    Recent Labs Lab 12/25/13 1631 12/26/13 0250 12/27/13 0601  LIPASE 467* 488* 177*   No results found for this basename: AMMONIA,  in the last 168 hours CBC:  Recent Labs Lab 12/25/13 1631 12/26/13 0250  WBC 11.5* 10.2  NEUTROABS 7.6 6.0  HGB 13.3 11.6*  HCT 38.6* 34.4*  MCV 91.3 91.7  PLT 237 204   Cardiac Enzymes:  Recent Labs Lab 12/26/13 0250  TROPONINI <0.30   BNP (last 3 results) No results found for this basename: PROBNP,  in the last 8760 hours CBG:  Recent Labs Lab 12/26/13 1218 12/26/13 1808 12/27/13 0023 12/27/13 0609 12/27/13 1128  GLUCAP 105* 102* 86 88 123*    No results found for this or any previous visit (from the past 240 hour(s)).   Studies: Ct Abdomen Pelvis W Contrast  12/26/2013   ADDENDUM REPORT: 12/26/2013 20:05  ADDENDUM: Voice recognition error: The third impression should read "Mild thickening in urinary bladder wall raises question of a degree of cystitis".  Original dictation by Dr. Jasmine December, addendum by Dr. Leonia Reeves  Findings conveyed toBurke Thompsonon 12/26/2013  at20:04.   Electronically Signed   By: Suzy Bouchard M.D.   On: 12/26/2013 20:05   12/26/2013   CLINICAL DATA:  Acute pancreatitis  EXAM: CT ABDOMEN AND PELVIS WITH CONTRAST  TECHNIQUE: Multidetector CT imaging of the abdomen and pelvis was performed using the standard protocol following bolus administration of intravenous contrast. Oral  contrast was also administered.  CONTRAST:  119mL OMNIPAQUE IOHEXOL 300 MG/ML  SOLN  COMPARISON:  August 17, 2012  FINDINGS: Lung bases are clear.  Liver is prominent, measuring 17.9 cm in length. There is hepatic steatosis. No focal liver lesions are identified. There is no biliary duct dilatation. Gallbladder wall is not thickened.  Spleen and adrenals appear normal.  There is moderate inflammatory change surrounding the pancreas, primarily in the head and body regions. There is edema in the uncinate process of the pancreas. There is an apparent pseudocyst in the uncinate process of the pancreas measuring 2.1 by 0.8 cm. There are scattered areas of focal pancreatic duct dilatation. There is no calcification or non cystic mass in the pancreas. Inflammation is seen abutting the second and third portions of the duodenum adjacent to the pancreas with mild wall thickening in this area consistent with a degree of duodenitis.  Kidneys bilaterally show no evidence of mass or hydronephrosis. There is no renal or ureteral calculus on either side.  In the pelvis, there is mild wall thickening of the urinary bladder. There is no pelvic mass or fluid collection. The appendix appears normal. Terminal ileum appears normal.  There is no appreciable bowel obstruction. No free air or portal venous air.  There is no ascites, adenopathy, or abscess in the abdomen or pelvis. There is atherosclerotic change in the aorta and iliac arteries without aneurysm. There is degenerative change at L5-S1 with vacuum phenomenon at this level. There are no blastic or lytic bone lesions. There is broad-based disc bulging at L4-5 and L5-S1.  IMPRESSION: Evidence of acute pancreatitis with mild peripancreatic fluid an apparent small pseudocyst in the uncinate process region. There is adjacent duodenitis due to the pancreatitis immediately adjacent to the duodenum in the second and third portions.  Appendix appears normal. No bowel obstruction. No  abscess. No renal or ureteral calculus. No hydronephrosis.  Mild thickening in urinary bladder wall raises question of a degree of appendicitis.  Liver enlarged with hepatic steatosis.  Electronically Signed: By: Lowella Grip M.D. On: 12/26/2013 11:41    Scheduled Meds: . enoxaparin (LOVENOX) injection  40 mg Subcutaneous Q24H  . folic acid  1 mg Oral Daily  . lipase/protease/amylase  12,000 Units Oral TID AC  . LORazepam  0-4 mg Intravenous Q6H   Followed by  . [START ON 12/28/2013] LORazepam  0-4 mg Intravenous Q12H  . metoprolol  50 mg Oral BID  . multivitamin with minerals  1 tablet Oral Daily  . nicotine  14 mg Transdermal Q24H  . pantoprazole  40 mg Oral BID  . QUEtiapine  100 mg Oral QHS  . sertraline  75 mg Oral Daily  . sodium chloride  3 mL Intravenous Q12H  . thiamine  100 mg Oral Daily   Or  . thiamine  100 mg Intravenous Daily   Continuous Infusions:    Active Problems:   DEPRESSION   HYPERTENSION   Alcohol-induced acute pancreatitis   Acute pancreatitis    Time spent: 25    Arneta Mahmood C  [facility] Pager [phone]. If 7PM-7AM, please contact night-coverage at [email or web address], password [facility] 12/27/2013, 2:39 PM  LOS: 2 days

## 2013-12-27 NOTE — Progress Notes (Signed)
No surgical interventions.  GI can help manage alcoholic pancreatitis.  Imogene Burn. Georgette Dover, MD, [facility]  General/ Trauma Surgery  12/27/2013 10:23 AM

## 2013-12-27 NOTE — Progress Notes (Signed)
Notified Baltazar Najjar, NP via text page that pt requesting more pain medication. Gave pt diludad 1mg  IV at 0604. Patient states that his pain is a 8 now. No call returned from NP. Will continue to monitor pt. Ranelle Oyster, RN

## 2013-12-27 NOTE — Clinical Documentation Improvement (Signed)
  RD Consult by Terrace Arabia on 12/26/13 at 1:59 pm  DOCUMENTATION CODES  Per approved criteria   -Severe malnutrition in the context of malnutrition   Pt meets criteria for severe MALNUTRITION in the context of chronic illness as evidenced by severe fat and muscle depletion and reported intake <75% of estimated needs for >1 month.  INTERVENTION:  Once diet upgraded, add Ensure Complete po TID, each supplement provides 350 kcal and 13 grams of protein  NUTRITION DIAGNOSIS:  Inadequate oral intake related to chronic pancreatitis as evidenced by wt loss and reported intake less than estimated needs.  Usual Body Weight: 145 lbs  % Usual Body Weight: 88%  BMI: Body mass index is 18 kg/(m^2).   Possible Clinical Conditions:   - Severe Protein Calorie Malnutrition   - Other Condition   - Unable to Clinically Determine   Thank You, Erling Conte ,RN Clinical Documentation Specialist:  [phone] [place] [employer]

## 2013-12-27 NOTE — Progress Notes (Signed)
Paged Dr. Dillard Essex due to pt requesting more pain medication and pt wondering if he can have a diet for breakfast this morning. Notified Sarah, dayshift RN of situation and that Dr. Dillard Essex would be calling her phone. Will continue to monitor pt. Ranelle Oyster, RN

## 2013-12-28 DIAGNOSIS — F10988 Alcohol use, unspecified with other alcohol-induced disorder: Secondary | ICD-10-CM

## 2013-12-28 LAB — BASIC METABOLIC PANEL
Anion gap: 12 (ref 5–15)
BUN: 3 mg/dL — ABNORMAL LOW (ref 6–23)
CALCIUM: 9.3 mg/dL (ref 8.4–10.5)
CO2: 26 mEq/L (ref 19–32)
Chloride: 102 mEq/L (ref 96–112)
Creatinine, Ser: 0.49 mg/dL — ABNORMAL LOW (ref 0.50–1.35)
GLUCOSE: 87 mg/dL (ref 70–99)
POTASSIUM: 4.2 meq/L (ref 3.7–5.3)
SODIUM: 140 meq/L (ref 137–147)

## 2013-12-28 LAB — GLUCOSE, CAPILLARY
Glucose-Capillary: 107 mg/dL — ABNORMAL HIGH (ref 70–99)
Glucose-Capillary: 105 mg/dL — ABNORMAL HIGH (ref 70–99)
Glucose-Capillary: 98 mg/dL (ref 70–99)

## 2013-12-28 LAB — LIPASE, BLOOD: Lipase: 151 U/L — ABNORMAL HIGH (ref 11–59)

## 2013-12-28 NOTE — Progress Notes (Signed)
[facility] PROGRESS NOTE  [name] KPT:[id number] DOB: 1970-10-04 DOA: 12/25/2013 PCP: Angelica Chessman, MD  Assessment/Plan: 1. Acute on chronic alcoholic pancreatitis -CT scan of abdomen and pelvis with evidence of acute pancreatitis and mild peripancreatic fluid an apparent small pseudocyst in the uncinate process region. Adjacent duodenitis noted. Mild thickening urinary bladder wall raises question of a degree of appendicitis -Appreciate surgery input>> no evidence of appendicitis -No surgical intervention for pseudocyst -Continue current conservative management>> lipase trending down - with episode of vomiting this am, will hold keep on clears for now, if tolerates lunch, will follow and advance to full liquids. 2.Tachycardia with history of WPW syndrome -watch in telemetry given history of WPW syndrome for any further worsening. -Continue beta blocker metoprolol  -urine drug screen is positive for  barbiturates and opiates only. 3. Hypertension  -Better controlled, continue beta blocker and when necessary hydralazine 4.depression - continue home medications.  5.Tobacco abuse and alcohol abuse - MD advised patient to quit this habit on admission. 6. Hypokalemia -Replace K. 7. Severe protein calorie malnutrition -follow and start on nutritional supplements once diet advance Code Status: Full Family Communication: : Bedside Disposition Plan: To home when medically ready   Consultants:  None  Procedures:  None  Antibiotics:  None   HPI/Subjective: States he vomited this am, abdominal pain less  Objective: Filed Vitals:   12/28/13 1248  BP: 131/93  Pulse: 73  Temp: 97.9 F (36.6 C)  Resp: 18    Intake/Output Summary (Last 24 hours) at 12/28/13 1411 Last data filed at 12/28/13 0859  Gross per 24 hour  Intake    684 ml  Output    850 ml  Net   -166 ml   Filed Weights   12/26/13 0112  Weight: 58.2 kg (128 lb 4.9 oz)    Exam:  General:  alert & oriented x3 In NAD Cardiovascular: RRR, nl S1 s2 Respiratory: CTAB Abdomen: soft +BS decreasing abdominal tenderness, no rebound. No right lower quadrant tenderness. Extremities: No cyanosis and no edema    Data Reviewed: Basic Metabolic Panel:  Recent Labs Lab 12/25/13 1631 12/26/13 0250 12/27/13 0601 12/28/13 0811  NA 140 139 140 140  K 3.9 3.3* 4.2 4.2  CL 99 103 105 102  CO2 23 21 23 26   GLUCOSE 111* 134* 80 87  BUN 8 6 3* 3*  CREATININE 0.63 0.52 0.50 0.49*  CALCIUM 10.3 8.9 9.2 9.3   Liver Function Tests:  Recent Labs Lab 12/25/13 1631 12/26/13 0250 12/27/13 0601  AST 68* 35 46*  ALT 44 29 34  ALKPHOS 173* 135* 126*  BILITOT 0.8 0.6 0.5  PROT 9.2* 7.6 7.0  ALBUMIN 4.6 3.6 3.2*    Recent Labs Lab 12/25/13 1631 12/26/13 0250 12/27/13 0601 12/28/13 0811  LIPASE 467* 488* 177* 151*   No results found for this basename: AMMONIA,  in the last 168 hours CBC:  Recent Labs Lab 12/25/13 1631 12/26/13 0250  WBC 11.5* 10.2  NEUTROABS 7.6 6.0  HGB 13.3 11.6*  HCT 38.6* 34.4*  MCV 91.3 91.7  PLT 237 204   Cardiac Enzymes:  Recent Labs Lab 12/26/13 0250  TROPONINI <0.30   BNP (last 3 results) No results found for this basename: PROBNP,  in the last 8760 hours CBG:  Recent Labs Lab 12/27/13 1128 12/27/13 1946 12/27/13 2326 12/28/13 0638 12/28/13 1203  GLUCAP 123* 117* 140* 107* 105*    No results found for this or any previous visit (from the past  240 hour(s)).   Studies: No results found.  Scheduled Meds: . enoxaparin (LOVENOX) injection  40 mg Subcutaneous Q24H  . folic acid  1 mg Oral Daily  . lipase/protease/amylase  12,000 Units Oral TID AC  . LORazepam  0-4 mg Intravenous Q12H  . metoprolol  50 mg Oral BID  . multivitamin with minerals  1 tablet Oral Daily  . nicotine  14 mg Transdermal Q24H  . pantoprazole  40 mg Oral BID  . QUEtiapine  100 mg Oral QHS  . sertraline  75 mg Oral Daily  . sodium chloride  3 mL  Intravenous Q12H  . thiamine  100 mg Oral Daily   Or  . thiamine  100 mg Intravenous Daily   Continuous Infusions:    Active Problems:   DEPRESSION   HYPERTENSION   Alcohol-induced acute pancreatitis   Acute pancreatitis    Time spent: [facility] Pager [phone]. If 7PM-7AM, please contact night-coverage at [email or web address], password [name] 12/28/2013, 2:11 PM  LOS: 3 days

## 2013-12-29 DIAGNOSIS — E43 Unspecified severe protein-calorie malnutrition: Secondary | ICD-10-CM

## 2013-12-29 LAB — COMPREHENSIVE METABOLIC PANEL
ALT: 72 U/L — ABNORMAL HIGH (ref 0–53)
AST: 98 U/L — AB (ref 0–37)
Albumin: 3.3 g/dL — ABNORMAL LOW (ref 3.5–5.2)
Alkaline Phosphatase: 127 U/L — ABNORMAL HIGH (ref 39–117)
Anion gap: 10 (ref 5–15)
BILIRUBIN TOTAL: 0.3 mg/dL (ref 0.3–1.2)
BUN: 4 mg/dL — ABNORMAL LOW (ref 6–23)
CALCIUM: 9.1 mg/dL (ref 8.4–10.5)
CHLORIDE: 102 meq/L (ref 96–112)
CO2: 27 meq/L (ref 19–32)
CREATININE: 0.55 mg/dL (ref 0.50–1.35)
GLUCOSE: 85 mg/dL (ref 70–99)
Potassium: 4.1 mEq/L (ref 3.7–5.3)
Sodium: 139 mEq/L (ref 137–147)
Total Protein: 7.1 g/dL (ref 6.0–8.3)

## 2013-12-29 LAB — GLUCOSE, CAPILLARY
GLUCOSE-CAPILLARY: 138 mg/dL — AB (ref 70–99)
GLUCOSE-CAPILLARY: 174 mg/dL — AB (ref 70–99)
Glucose-Capillary: 92 mg/dL (ref 70–99)
Glucose-Capillary: 111 mg/dL — ABNORMAL HIGH (ref 70–99)
Glucose-Capillary: 80 mg/dL (ref 70–99)

## 2013-12-29 LAB — LIPASE, BLOOD
LIPASE: 127 U/L — AB (ref 11–59)
LIPASE: 122 U/L — AB (ref 11–59)

## 2013-12-29 MED ORDER — ZOLPIDEM TARTRATE 5 MG PO TABS
5.0000 mg | ORAL_TABLET | Freq: Every evening | ORAL | Status: DC | PRN
  Administered 2013-12-29 – 2013-12-30 (×2): 5 mg via ORAL
  Filled 2013-12-29 (×2): qty 1

## 2013-12-29 MED ORDER — BOOST / RESOURCE BREEZE PO LIQD
1.0000 | Freq: Three times a day (TID) | ORAL | Status: DC
  Administered 2013-12-29 – 2013-12-31 (×6): 1 via ORAL

## 2013-12-29 MED ORDER — HYDROMORPHONE HCL PF 1 MG/ML IJ SOLN
1.0000 mg | INTRAMUSCULAR | Status: DC | PRN
  Administered 2013-12-29 – 2013-12-30 (×9): 1 mg via INTRAVENOUS
  Filled 2013-12-29 (×9): qty 1

## 2013-12-29 MED ORDER — ACETAMINOPHEN 325 MG PO TABS
650.0000 mg | ORAL_TABLET | Freq: Four times a day (QID) | ORAL | Status: DC | PRN
  Administered 2013-12-29: 650 mg via ORAL
  Filled 2013-12-29: qty 2

## 2013-12-29 NOTE — Progress Notes (Signed)
[facility] PROGRESS NOTE  [name] WCB:[id number] DOB: 1970-08-31 DOA: 12/25/2013 PCP: Angelica Chessman, MD  Assessment/Plan: 1. Acute on chronic alcoholic pancreatitis -CT scan of abdomen and pelvis with evidence of acute pancreatitis and mild peripancreatic fluid an apparent small pseudocyst in the uncinate process region. Adjacent duodenitis noted. Mild thickening urinary bladder wall raises question of a degree of appendicitis -Appreciate surgery input>> no evidence of appendicitis -No surgical intervention for pseudocyst -Continue current conservative management>> lipase trending down - with episode of vomiting this am, will hold keep on clears for now, if tolerates lunch, will follow and advance to full liquids. 2.Tachycardia with history of WPW syndrome -watch in telemetry given history of WPW syndrome for any further worsening. -Continue beta blocker metoprolol  -urine drug screen is positive for  barbiturates and opiates only. 3. Hypertension  -Better controlled, continue beta blocker and when necessary hydralazine 4.depression - continue home medications.  5.Tobacco abuse and alcohol abuse - MD advised patient to quit this habit on admission. 6. Hypokalemia -Replace K. 7. Severe protein calorie malnutrition -start on nutritional supplements, follow Code Status: Full Family Communication: : Bedside Disposition Plan: To home when medically ready   Consultants:  None  Procedures:  None  Antibiotics:  None   HPI/Subjective: Tolerating full liquids, still with abdominal pain requiring IV narcotics  Objective: Filed Vitals:   12/29/13 1040  BP: 129/90  Pulse: 79  Temp:   Resp:     Intake/Output Summary (Last 24 hours) at 12/29/13 1310 Last data filed at 12/29/13 1008  Gross per 24 hour  Intake    480 ml  Output      0 ml  Net    480 ml   Filed Weights   12/26/13 0112  Weight: 58.2 kg (128 lb 4.9 oz)    Exam:  General: alert & oriented  x3 In NAD Cardiovascular: RRR, nl S1 s2 Respiratory: CTAB Abdomen: soft +BS mild upper abdominal tenderness, no rebound. No right lower quadrant tenderness. Extremities: No cyanosis and no edema    Data Reviewed: Basic Metabolic Panel:  Recent Labs Lab 12/25/13 1631 12/26/13 0250 12/27/13 0601 12/28/13 0811 12/29/13 0530  NA 140 139 140 140 139  K 3.9 3.3* 4.2 4.2 4.1  CL 99 103 105 102 102  CO2 23 21 23 26 27   GLUCOSE 111* 134* 80 87 85  BUN 8 6 3* 3* 4*  CREATININE 0.63 0.52 0.50 0.49* 0.55  CALCIUM 10.3 8.9 9.2 9.3 9.1   Liver Function Tests:  Recent Labs Lab 12/25/13 1631 12/26/13 0250 12/27/13 0601 12/29/13 0530  AST 68* 35 46* 98*  ALT 44 29 34 72*  ALKPHOS 173* 135* 126* 127*  BILITOT 0.8 0.6 0.5 0.3  PROT 9.2* 7.6 7.0 7.1  ALBUMIN 4.6 3.6 3.2* 3.3*    Recent Labs Lab 12/25/13 1631 12/26/13 0250 12/27/13 0601 12/28/13 0811 12/29/13 0530  LIPASE 467* 488* 177* 151* 127*   No results found for this basename: AMMONIA,  in the last 168 hours CBC:  Recent Labs Lab 12/25/13 1631 12/26/13 0250  WBC 11.5* 10.2  NEUTROABS 7.6 6.0  HGB 13.3 11.6*  HCT 38.6* 34.4*  MCV 91.3 91.7  PLT 237 204   Cardiac Enzymes:  Recent Labs Lab 12/26/13 0250  TROPONINI <0.30   BNP (last 3 results) No results found for this basename: PROBNP,  in the last 8760 hours CBG:  Recent Labs Lab 12/28/13 1203 12/28/13 1959 12/29/13 0034 12/29/13 0549 12/29/13 [place]  105* 98 138* 92 111*    No results found for this or any previous visit (from the past 240 hour(s)).   Studies: No results found.  Scheduled Meds: . enoxaparin (LOVENOX) injection  40 mg Subcutaneous Q24H  . folic acid  1 mg Oral Daily  . lipase/protease/amylase  12,000 Units Oral TID AC  . LORazepam  0-4 mg Intravenous Q12H  . metoprolol  50 mg Oral BID  . multivitamin with minerals  1 tablet Oral Daily  . nicotine  14 mg Transdermal Q24H  . pantoprazole  40 mg Oral BID  .  QUEtiapine  100 mg Oral QHS  . sertraline  75 mg Oral Daily  . sodium chloride  3 mL Intravenous Q12H  . thiamine  100 mg Oral Daily   Continuous Infusions:    Active Problems:   DEPRESSION   HYPERTENSION   Alcohol-induced acute pancreatitis   Acute pancreatitis    Time spent: [facility] Pager [phone]. If 7PM-7AM, please contact night-coverage at [email or web address], password [facility] 12/29/2013, 1:10 PM  LOS: 4 days

## 2013-12-29 NOTE — Progress Notes (Addendum)
Patient asking for ativan to help him "rest". CIWA score 3, patient states he has a mild headache and a little anxiety. Patient stated his last drink of alcohol was last Wednesday 12/25/13. MD contacted asking for prn medication for mild pain r/t headache.   1:00pm Orders received from Dr. Dillard Essex.

## 2013-12-30 ENCOUNTER — Inpatient Hospital Stay: Payer: Self-pay

## 2013-12-30 LAB — LIPASE, BLOOD: Lipase: 83 U/L — ABNORMAL HIGH (ref 11–59)

## 2013-12-30 LAB — GLUCOSE, CAPILLARY
GLUCOSE-CAPILLARY: 102 mg/dL — AB (ref 70–99)
Glucose-Capillary: 110 mg/dL — ABNORMAL HIGH (ref 70–99)

## 2013-12-30 MED ORDER — HYDROMORPHONE HCL PF 1 MG/ML IJ SOLN
1.0000 mg | INTRAMUSCULAR | Status: DC | PRN
  Administered 2013-12-30 – 2013-12-31 (×5): 1 mg via INTRAVENOUS
  Filled 2013-12-30 (×5): qty 1

## 2013-12-30 MED ORDER — HYDROCODONE-ACETAMINOPHEN 5-325 MG PO TABS: 1.0000 | ORAL_TABLET | Freq: Four times a day (QID) | ORAL | Status: DC | PRN

## 2013-12-30 MED ORDER — OXYCODONE HCL 5 MG PO TABS
5.0000 mg | ORAL_TABLET | Freq: Four times a day (QID) | ORAL | Status: DC | PRN
  Administered 2013-12-30 – 2013-12-31 (×4): 10 mg via ORAL
  Filled 2013-12-30 (×4): qty 2

## 2013-12-30 NOTE — Progress Notes (Signed)
[facility] PROGRESS NOTE  QUARTEZ LAGOS HYW:[id number] DOB: 16-Sep-1970 DOA: 12/25/2013 PCP: Angelica Chessman, MD  Assessment/Plan: 1. Acute on chronic alcoholic pancreatitis -CT scan of abdomen and pelvis with evidence of acute pancreatitis and mild peripancreatic fluid an apparent small pseudocyst in the uncinate process region. Adjacent duodenitis noted. Mild thickening urinary bladder wall raises question of a degree of appendicitis -Appreciate surgery input>> no evidence of appendicitis -No surgical intervention for pseudocyst -Continue current conservative management>> lipase trending down - Advance diet as tolerated, start by mouth meds and wean off IV narcotics. 2.Tachycardia with history of WPW syndrome -watch in telemetry given history of WPW syndrome for any further worsening. -Continue beta blocker metoprolol  -urine drug screen is positive for  barbiturates and opiates only. 3. Hypertension  -Better controlled, continue beta blocker and when necessary hydralazine 4.depression - continue home medications.  5.Tobacco abuse and alcohol abuse - MD advised patient to quit this habit on admission. 6. Hypokalemia -Replace K. 7. Severe protein calorie malnutrition -start on nutritional supplements, follow Code Status: Full Family Communication:  Mother and grandmother at bedside  Disposition Plan: To home when medically ready   Consultants:  None  Procedures:  None  Antibiotics:  None   HPI/Subjective: still some abdominal pain but decreasing, denies nausea vomiting.  Objective: Filed Vitals:   12/30/13 0923  BP: 129/86  Pulse: 80  Temp:   Resp:     Intake/Output Summary (Last 24 hours) at 12/30/13 1142 Last data filed at 12/30/13 0540  Gross per 24 hour  Intake   1060 ml  Output      0 ml  Net   1060 ml   Filed Weights   12/26/13 0112  Weight: 58.2 kg (128 lb 4.9 oz)    Exam:  General: alert & oriented x3 In NAD Cardiovascular: RRR, nl  S1 s2 Respiratory: CTAB Abdomen: soft +BS mild upper abdominal tenderness, no rebound. No right lower quadrant tenderness. Extremities: No cyanosis and no edema    Data Reviewed: Basic Metabolic Panel:  Recent Labs Lab 12/25/13 1631 12/26/13 0250 12/27/13 0601 12/28/13 0811 12/29/13 0530  NA 140 139 140 140 139  K 3.9 3.3* 4.2 4.2 4.1  CL 99 103 105 102 102  CO2 23 21 23 26 27   GLUCOSE 111* 134* 80 87 85  BUN 8 6 3* 3* 4*  CREATININE 0.63 0.52 0.50 0.49* 0.55  CALCIUM 10.3 8.9 9.2 9.3 9.1   Liver Function Tests:  Recent Labs Lab 12/25/13 1631 12/26/13 0250 12/27/13 0601 12/29/13 0530  AST 68* 35 46* 98*  ALT 44 29 34 72*  ALKPHOS 173* 135* 126* 127*  BILITOT 0.8 0.6 0.5 0.3  PROT 9.2* 7.6 7.0 7.1  ALBUMIN 4.6 3.6 3.2* 3.3*    Recent Labs Lab 12/27/13 0601 12/28/13 0811 12/29/13 0530 12/29/13 1423 12/30/13 0505  LIPASE 177* 151* 127* 122* 83*   No results found for this basename: AMMONIA,  in the last 168 hours CBC:  Recent Labs Lab 12/25/13 1631 12/26/13 0250  WBC 11.5* 10.2  NEUTROABS 7.6 6.0  HGB 13.3 11.6*  HCT 38.6* 34.4*  MCV 91.3 91.7  PLT 237 204   Cardiac Enzymes:  Recent Labs Lab 12/26/13 0250  TROPONINI <0.30   BNP (last 3 results) No results found for this basename: PROBNP,  in the last 8760 hours CBG:  Recent Labs Lab 12/29/13 1158 12/29/13 1735 12/29/13 2350 12/30/13 0536 12/30/13 1138  GLUCAP 111* 80 174* 110* 102*  No results found for this or any previous visit (from the past 240 hour(s)).   Studies: No results found.  Scheduled Meds: . enoxaparin (LOVENOX) injection  40 mg Subcutaneous Q24H  . feeding supplement (RESOURCE BREEZE)  1 Container Oral TID BM  . folic acid  1 mg Oral Daily  . lipase/protease/amylase  12,000 Units Oral TID AC  . metoprolol  50 mg Oral BID  . multivitamin with minerals  1 tablet Oral Daily  . nicotine  14 mg Transdermal Q24H  . pantoprazole  40 mg Oral BID  . QUEtiapine   100 mg Oral QHS  . sertraline  75 mg Oral Daily  . sodium chloride  3 mL Intravenous Q12H  . thiamine  100 mg Oral Daily   Continuous Infusions:    Active Problems:   DEPRESSION   HYPERTENSION   Alcohol-induced acute pancreatitis   Acute pancreatitis    Time spent: [facility] Pager [phone]. If 7PM-7AM, please contact night-coverage at [email or web address], password [facility] 12/30/2013, 11:42 AM  LOS: 5 days

## 2013-12-30 NOTE — Progress Notes (Addendum)
[facility] PROGRESS NOTE  [name] BWI:[id number] DOB: 07-06-1970 DOA: 12/25/2013 PCP: Angelica Chessman, MD  Assessment/Plan: 1. Acute on chronic alcoholic pancreatitis -CT scan of abdomen and pelvis with evidence of acute pancreatitis and mild peripancreatic fluid an apparent small pseudocyst in the uncinate process region. Adjacent duodenitis noted. Mild thickening urinary bladder wall raises question of a degree of appendicitis -Appreciate surgery input>> no evidence of appendicitis -No surgical intervention for pseudocyst -Continue current conservative management>> lipase trending down - pt tolerating full liquids, advance diet -transition to po meds 2.Tachycardia with history of WPW syndrome -watch in telemetry given history of WPW syndrome for any further worsening. -Continue beta blocker metoprolol  -urine drug screen is positive for  barbiturates and opiates only. 3. Hypertension  -Better controlled, continue beta blocker and when necessary hydralazine 4.depression - continue home medications.  5.Tobacco abuse and alcohol abuse - MD advised patient to quit this habit on admission. 6. Hypokalemia -Replace K. 7. Severe protein calorie malnutrition -start on nutritional supplements, follow Code Status: Full Family Communication: : Bedside Disposition Plan: To home when medically ready   Consultants:  None  Procedures:  None  Antibiotics:  None   HPI/Subjective: Tolerating full liquids, no new complaints  Objective: Filed Vitals:   12/30/13 0923  BP: 129/86  Pulse: 80  Temp:   Resp:     Intake/Output Summary (Last 24 hours) at 12/30/13 1218 Last data filed at 12/30/13 0540  Gross per 24 hour  Intake   1060 ml  Output      0 ml  Net   1060 ml   Filed Weights   12/26/13 0112  Weight: 58.2 kg (128 lb 4.9 oz)    Exam:  General: alert & oriented x3 In NAD Cardiovascular: RRR, nl S1 s2 Respiratory: CTAB Abdomen: soft +BS mild upper  abdominal tenderness, no rebound. No right lower quadrant tenderness. Extremities: No cyanosis and no edema    Data Reviewed: Basic Metabolic Panel:  Recent Labs Lab 12/25/13 1631 12/26/13 0250 12/27/13 0601 12/28/13 0811 12/29/13 0530  NA 140 139 140 140 139  K 3.9 3.3* 4.2 4.2 4.1  CL 99 103 105 102 102  CO2 23 21 23 26 27   GLUCOSE 111* 134* 80 87 85  BUN 8 6 3* 3* 4*  CREATININE 0.63 0.52 0.50 0.49* 0.55  CALCIUM 10.3 8.9 9.2 9.3 9.1   Liver Function Tests:  Recent Labs Lab 12/25/13 1631 12/26/13 0250 12/27/13 0601 12/29/13 0530  AST 68* 35 46* 98*  ALT 44 29 34 72*  ALKPHOS 173* 135* 126* 127*  BILITOT 0.8 0.6 0.5 0.3  PROT 9.2* 7.6 7.0 7.1  ALBUMIN 4.6 3.6 3.2* 3.3*    Recent Labs Lab 12/27/13 0601 12/28/13 0811 12/29/13 0530 12/29/13 1423 12/30/13 0505  LIPASE 177* 151* 127* 122* 83*   No results found for this basename: AMMONIA,  in the last 168 hours CBC:  Recent Labs Lab 12/25/13 1631 12/26/13 0250  WBC 11.5* 10.2  NEUTROABS 7.6 6.0  HGB 13.3 11.6*  HCT 38.6* 34.4*  MCV 91.3 91.7  PLT 237 204   Cardiac Enzymes:  Recent Labs Lab 12/26/13 0250  TROPONINI <0.30   BNP (last 3 results) No results found for this basename: PROBNP,  in the last 8760 hours CBG:  Recent Labs Lab 12/29/13 1158 12/29/13 1735 12/29/13 2350 12/30/13 0536 12/30/13 1138  GLUCAP 111* 80 174* 110* 102*    No results found for this or any previous visit (from the  past 240 hour(s)).   Studies: No results found.  Scheduled Meds: . enoxaparin (LOVENOX) injection  40 mg Subcutaneous Q24H  . feeding supplement (RESOURCE BREEZE)  1 Container Oral TID BM  . folic acid  1 mg Oral Daily  . lipase/protease/amylase  12,000 Units Oral TID AC  . metoprolol  50 mg Oral BID  . multivitamin with minerals  1 tablet Oral Daily  . nicotine  14 mg Transdermal Q24H  . pantoprazole  40 mg Oral BID  . QUEtiapine  100 mg Oral QHS  . sertraline  75 mg Oral Daily  .  sodium chloride  3 mL Intravenous Q12H  . thiamine  100 mg Oral Daily   Continuous Infusions:    Active Problems:   DEPRESSION   HYPERTENSION   Alcohol-induced acute pancreatitis   Acute pancreatitis    Time spent: [facility] Pager [phone]. If 7PM-7AM, please contact night-coverage at [email or web address], password [facility] 12/30/2013, 12:18 PM  LOS: 5 days

## 2013-12-31 LAB — COMPREHENSIVE METABOLIC PANEL
ALBUMIN: 3.3 g/dL — AB (ref 3.5–5.2)
ALK PHOS: 116 U/L (ref 39–117)
ALT: 64 U/L — ABNORMAL HIGH (ref 0–53)
AST: 45 U/L — AB (ref 0–37)
Anion gap: 12 (ref 5–15)
BUN: <3 mg/dL — ABNORMAL LOW (ref 6–23)
CO2: 25 mEq/L (ref 19–32)
Calcium: 9.2 mg/dL (ref 8.4–10.5)
Chloride: 102 mEq/L (ref 96–112)
Creatinine, Ser: 0.54 mg/dL (ref 0.50–1.35)
GFR calc Af Amer: >90 mL/min (ref >90)
GFR calc non Af Amer: >90 mL/min (ref >90)
Glucose, Bld: 132 mg/dL — ABNORMAL HIGH (ref 70–99)
POTASSIUM: 3.9 meq/L (ref 3.7–5.3)
Sodium: 139 mEq/L (ref 137–147)
Total Bilirubin: 0.3 mg/dL (ref 0.3–1.2)
Total Protein: 7.1 g/dL (ref 6.0–8.3)

## 2013-12-31 LAB — GLUCOSE, CAPILLARY
GLUCOSE-CAPILLARY: 101 mg/dL — AB (ref 70–99)
Glucose-Capillary: 98 mg/dL (ref 70–99)

## 2013-12-31 LAB — LIPASE, BLOOD: LIPASE: 76 U/L — AB (ref 11–59)

## 2013-12-31 MED ORDER — OXYCODONE HCL 5 MG PO TABS: 5.0000 mg | ORAL_TABLET | Freq: Four times a day (QID) | ORAL | Status: DC | PRN

## 2013-12-31 MED ORDER — PANCRELIPASE (LIP-PROT-AMYL) 12000-38000 UNITS PO CPEP
12000.0000 [IU] | ORAL_CAPSULE | Freq: Three times a day (TID) | ORAL | Status: DC
Start: 2013-12-31 — End: 2014-01-15

## 2013-12-31 NOTE — Progress Notes (Signed)
Patient was discharged home by MD order; discharged instructions review and give to patient with care notes and prescriptions; IV DIC; skin intact; patient will be escorted to the car by nurse tech via wheelchair.  

## 2013-12-31 NOTE — Discharge Summary (Signed)
Physician Discharge Summary  [name] IRS:[id number] DOB: 1971/02/08 DOA: 12/25/2013  PCP: Angelica Chessman, MD  Admit date: 12/25/2013 Discharge date: 12/31/2013  Time spent: >30 minutes  Recommendations for Outpatient Follow-up:  Follow-up Information   Follow up with [facility]     On 01/09/2014. (3:30)    Contact information:   [place] [id number] [phone]       Discharge Diagnoses:  Active Problems:   DEPRESSION   HYPERTENSION   Alcohol-induced acute pancreatitis   Acute pancreatitis   Discharge Condition: Improved/stable  Diet recommendation: Low-fat  Filed Weights   12/26/13 0112  Weight: 58.2 kg (128 lb 4.9 oz)    History of present illness:  Patient is a 43 y.o. male with history of chronic alcoholic pancreatitis, WPW syndrome, depression presented to the ER with complaints of abdominal pain. Patient states he has been having abdominal pain worsening over the last 24 hours. Pain is mostly on the epigastric and left quadrants. Patient has had associated nausea vomiting and diarrhea. In the ER patient was found to have labs with elevated lipase and patient was tachycardic. Patient was given a fluid bolus and admitted for further pain management of acute on chronic pancreatitis. Patient states that his last drink was one week ago. Denies any chest pain or shortness of breath fever chills. He was admitted for further evaluation and management   Hospital Course:  1. Acute on chronic alcoholic pancreatitis  -CT scan of abdomen and pelvis with evidence of acute pancreatitis and mild peripancreatic fluid an apparent small pseudocyst in the uncinate process region. Adjacent duodenitis noted. Mild thickening urinary bladder wall raises question of a degree of appendicitis  -Surgery was consulted reviewed the CT scan>> no evidence of appendicitis  -No surgical intervention for pseudocyst  -Continue current  conservative management>> lipase trending down  - He gradually improved, his diet was advanced, he is tolerating solids and pain much decreased on by mouth meds.  - He'll be discharged at this time his to follow up outpatient  - He was counseled to quit alcohol  2.Tachycardia with history of WPW syndrome -watch in telemetry given history of WPW syndrome for any further worsening. -Continue beta blocker metoprolol  -urine drug screen is positive for barbiturates and opiates only.  3. Hypertension  -Better controlled, continue beta blocker and when necessary hydralazine  4.depression - continue home medications.  5.Tobacco abuse and alcohol abuse - MD advised patient to quit this habit on admission.  6. Hypokalemia  -Replace K.  7. Severe protein calorie malnutrition  -start on nutritional supplements, follow 8. Alcohol abuse -Toprotocol,he was counseled to quit  alcohol as above  Procedures:  None  Consultations:  Surgery  Discharge Exam: Filed Vitals:   12/31/13 1311  BP: 127/85  Pulse: 71  Temp: 98.6 F (37 C)  Resp: 16    Exam:  General: alert & oriented x3 In NAD  Cardiovascular: RRR, nl S1 s2  Respiratory: CTAB  Abdomen: soft +BS mild upper abdominal tenderness, no rebound. No right lower quadrant tenderness.  Extremities: No cyanosis and no edema   Discharge Instructions You were cared for by a hospitalist during your hospital stay. If you have any questions about your discharge medications or the care you received while you were in the hospital after you are discharged, you can call the unit and asked to speak with the hospitalist on call if the hospitalist that took care of you is  not available. Once you are discharged, your primary care physician will handle any further medical issues. Please note that NO REFILLS for any discharge medications will be authorized once you are discharged, as it is imperative that you return to your primary care physician (or establish  a relationship with a primary care physician if you do not have one) for your aftercare needs so that they can reassess your need for medications and monitor your lab values.  Discharge Instructions   Diet - low sodium heart healthy    Complete by:  As directed      Increase activity slowly    Complete by:  As directed             Medication List    STOP taking these medications       ibuprofen 800 MG tablet  Commonly known as:  ADVIL,MOTRIN     LORazepam 1 MG tablet  Commonly known as:  ATIVAN      TAKE these medications       acetaminophen 500 MG tablet  Commonly known as:  TYLENOL  Take 1,000 mg by mouth every 6 (six) hours as needed for mild pain.     albuterol 108 (90 BASE) MCG/ACT inhaler  Commonly known as:  PROVENTIL HFA;VENTOLIN HFA  Inhale 1-2 puffs into the lungs every 6 (six) hours as needed for wheezing or shortness of breath.     feeding supplement (ENSURE COMPLETE) Liqd  Take 237 mLs by mouth 2 (two) times daily between meals.     folic acid 1 MG tablet  Commonly known as:  FOLVITE  Take 1 tablet (1 mg total) by mouth daily.     gi cocktail Susp suspension  Take 30 mLs by mouth 3 (three) times daily as needed for indigestion. Shake well.     lipase/protease/amylase 12000 UNITS Cpep capsule  Commonly known as:  CREON  Take 1 capsule by mouth 3 (three) times daily before meals.     metoprolol 50 MG tablet  Commonly known as:  LOPRESSOR  Take 1 tablet (50 mg total) by mouth 2 (two) times daily. For high blood pressure     multivitamin with minerals Tabs tablet  Take 1 tablet by mouth daily. For low vitamin     ondansetron 4 MG tablet  Commonly known as:  ZOFRAN  Take 1 tablet (4 mg total) by mouth every 6 (six) hours.     oxyCODONE 5 MG immediate release tablet  Commonly known as:  Oxy IR/ROXICODONE  Take 1-2 tablets (5-10 mg total) by mouth every 6 (six) hours as needed for severe pain.     pantoprazole 40 MG tablet  Commonly known as:   PROTONIX  Take 1 tablet (40 mg total) by mouth 2 (two) times daily.     QUEtiapine 100 MG tablet  Commonly known as:  SEROQUEL  Take 100 mg by mouth at bedtime. For mood control     sertraline 25 MG tablet  Commonly known as:  ZOLOFT  Take 3 tablets (75 mg total) by mouth daily. For depression     thiamine 100 MG tablet  Take 1 tablet (100 mg total) by mouth daily.       Allergies  Allergen Reactions  . Trazodone And Nefazodone Other (See Comments)    Muscle spasms  . Shellfish-Derived Products Nausea And Vomiting       Follow-up Information   Follow up with [facility]     On  01/09/2014. (3:30)    Contact information:   [place] [id number] [phone]       The results of significant diagnostics from this hospitalization (including imaging, microbiology, ancillary and laboratory) are listed below for reference.    Significant Diagnostic Studies: Ct Abdomen Pelvis W Contrast  12/26/2013   ADDENDUM REPORT: 12/26/2013 20:05  ADDENDUM: Voice recognition error: The third impression should read "Mild thickening in urinary bladder wall raises question of a degree of cystitis". Original dictation by Dr. Jasmine December, addendum by Dr. Leonia Reeves  Findings conveyed toBurke Thompsonon 12/26/2013  at20:04.   Electronically Signed   By: Suzy Bouchard M.D.   On: 12/26/2013 20:05   12/26/2013   CLINICAL DATA:  Acute pancreatitis  EXAM: CT ABDOMEN AND PELVIS WITH CONTRAST  TECHNIQUE: Multidetector CT imaging of the abdomen and pelvis was performed using the standard protocol following bolus administration of intravenous contrast. Oral contrast was also administered.  CONTRAST:  120mL OMNIPAQUE IOHEXOL 300 MG/ML  SOLN  COMPARISON:  August 17, 2012  FINDINGS: Lung bases are clear.  Liver is prominent, measuring 17.9 cm in length. There is hepatic steatosis. No focal liver lesions are identified. There is no biliary duct dilatation. Gallbladder wall  is not thickened.  Spleen and adrenals appear normal.  There is moderate inflammatory change surrounding the pancreas, primarily in the head and body regions. There is edema in the uncinate process of the pancreas. There is an apparent pseudocyst in the uncinate process of the pancreas measuring 2.1 by 0.8 cm. There are scattered areas of focal pancreatic duct dilatation. There is no calcification or non cystic mass in the pancreas. Inflammation is seen abutting the second and third portions of the duodenum adjacent to the pancreas with mild wall thickening in this area consistent with a degree of duodenitis.  Kidneys bilaterally show no evidence of mass or hydronephrosis. There is no renal or ureteral calculus on either side.  In the pelvis, there is mild wall thickening of the urinary bladder. There is no pelvic mass or fluid collection. The appendix appears normal. Terminal ileum appears normal.  There is no appreciable bowel obstruction. No free air or portal venous air.  There is no ascites, adenopathy, or abscess in the abdomen or pelvis. There is atherosclerotic change in the aorta and iliac arteries without aneurysm. There is degenerative change at L5-S1 with vacuum phenomenon at this level. There are no blastic or lytic bone lesions. There is broad-based disc bulging at L4-5 and L5-S1.  IMPRESSION: Evidence of acute pancreatitis with mild peripancreatic fluid an apparent small pseudocyst in the uncinate process region. There is adjacent duodenitis due to the pancreatitis immediately adjacent to the duodenum in the second and third portions.  Appendix appears normal. No bowel obstruction. No abscess. No renal or ureteral calculus. No hydronephrosis.  Mild thickening in urinary bladder wall raises question of a degree of appendicitis.  Liver enlarged with hepatic steatosis.  Electronically Signed: By: Lowella Grip M.D. On: 12/26/2013 11:41   Dg Abd Acute W/chest  12/09/2013   CLINICAL DATA:   Pancreatitis; diarrhea  EXAM: ACUTE ABDOMEN SERIES (ABDOMEN 2 VIEW & CHEST 1 VIEW)  COMPARISON:  Chest radiograph October 17, 2013; abdominal radiograph Oct 06, 2013  FINDINGS: Chest radiograph: There is no edema or consolidation. Heart size and pulmonary vascularity are normal. No adenopathy.  Supine and upright abdomen: Bowel gas pattern is unremarkable. No obstruction or free air. There is moderate stool in the colon. There is a  phlebolith in the right pelvis. There is a healed fracture of the posterior right tenth rib.  IMPRESSION: Unremarkable bowel gas pattern.  No lung edema or consolidation.   Electronically Signed   By: Lowella Grip M.D.   On: 12/09/2013 21:16    Microbiology: No results found for this or any previous visit (from the past 240 hour(s)).   Labs: Basic Metabolic Panel:  Recent Labs Lab 12/26/13 0250 12/27/13 0601 12/28/13 0811 12/29/13 0530 12/31/13 0741  NA 139 140 140 139 139  K 3.3* 4.2 4.2 4.1 3.9  CL 103 105 102 102 102  CO2 21 23 26 27 25   GLUCOSE 134* 80 87 85 132*  BUN 6 3* 3* 4* <3*  CREATININE 0.52 0.50 0.49* 0.55 0.54  CALCIUM 8.9 9.2 9.3 9.1 9.2   Liver Function Tests:  Recent Labs Lab 12/25/13 1631 12/26/13 0250 12/27/13 0601 12/29/13 0530 12/31/13 0741  AST 68* 35 46* 98* 45*  ALT 44 29 34 72* 64*  ALKPHOS 173* 135* 126* 127* 116  BILITOT 0.8 0.6 0.5 0.3 0.3  PROT 9.2* 7.6 7.0 7.1 7.1  ALBUMIN 4.6 3.6 3.2* 3.3* 3.3*    Recent Labs Lab 12/28/13 0811 12/29/13 0530 12/29/13 1423 12/30/13 0505 12/31/13 0741  LIPASE 151* 127* 122* 83* 76*   No results found for this basename: AMMONIA,  in the last 168 hours CBC:  Recent Labs Lab 12/25/13 1631 12/26/13 0250  WBC 11.5* 10.2  NEUTROABS 7.6 6.0  HGB 13.3 11.6*  HCT 38.6* 34.4*  MCV 91.3 91.7  PLT 237 204   Cardiac Enzymes:  Recent Labs Lab 12/26/13 0250  TROPONINI <0.30   BNP: BNP (last 3 results) No results found for this basename: PROBNP,  in the last 8760  hours CBG:  Recent Labs Lab 12/29/13 2350 12/30/13 0536 12/30/13 1138 12/31/13 0059 12/31/13 1215  GLUCAP 174* 110* 102* 98 101*       Signed:  Veneta Sliter C  [facility] 12/31/2013, 4:16 PM

## 2014-01-06 ENCOUNTER — Emergency Department (HOSPITAL_COMMUNITY)
Admission: EM | Admit: 2014-01-06 | Discharge: 2014-01-06 | Disposition: A | Discharge status: Home / Self Care | Payer: Self-pay | Attending: Emergency Medicine | Admitting: Emergency Medicine

## 2014-01-06 ENCOUNTER — Encounter (HOSPITAL_COMMUNITY): Payer: Self-pay | Admitting: Emergency Medicine

## 2014-01-06 DIAGNOSIS — K219 Gastro-esophageal reflux disease without esophagitis: Secondary | ICD-10-CM | POA: Insufficient documentation

## 2014-01-06 DIAGNOSIS — F329 Major depressive disorder, single episode, unspecified: Secondary | ICD-10-CM | POA: Insufficient documentation

## 2014-01-06 DIAGNOSIS — K861 Other chronic pancreatitis: Secondary | ICD-10-CM | POA: Insufficient documentation

## 2014-01-06 DIAGNOSIS — Z79899 Other long term (current) drug therapy: Secondary | ICD-10-CM | POA: Insufficient documentation

## 2014-01-06 DIAGNOSIS — D649 Anemia, unspecified: Secondary | ICD-10-CM | POA: Insufficient documentation

## 2014-01-06 DIAGNOSIS — J45909 Unspecified asthma, uncomplicated: Secondary | ICD-10-CM | POA: Insufficient documentation

## 2014-01-06 DIAGNOSIS — F17200 Nicotine dependence, unspecified, uncomplicated: Secondary | ICD-10-CM | POA: Insufficient documentation

## 2014-01-06 DIAGNOSIS — R011 Cardiac murmur, unspecified: Secondary | ICD-10-CM | POA: Insufficient documentation

## 2014-01-06 DIAGNOSIS — R1013 Epigastric pain: Secondary | ICD-10-CM | POA: Insufficient documentation

## 2014-01-06 DIAGNOSIS — F411 Generalized anxiety disorder: Secondary | ICD-10-CM | POA: Insufficient documentation

## 2014-01-06 DIAGNOSIS — K860 Alcohol-induced chronic pancreatitis: Secondary | ICD-10-CM

## 2014-01-06 DIAGNOSIS — M129 Arthropathy, unspecified: Secondary | ICD-10-CM | POA: Insufficient documentation

## 2014-01-06 DIAGNOSIS — I10 Essential (primary) hypertension: Secondary | ICD-10-CM | POA: Insufficient documentation

## 2014-01-06 DIAGNOSIS — F3289 Other specified depressive episodes: Secondary | ICD-10-CM | POA: Insufficient documentation

## 2014-01-06 LAB — CBC WITH DIFFERENTIAL/PLATELET
Basophils Absolute: 0 10*3/uL (ref 0.0–0.1)
Basophils Relative: 0 % (ref 0–1)
Eosinophils Absolute: 0.3 10*3/uL (ref 0.0–0.7)
Eosinophils Relative: 4 % (ref 0–5)
HEMATOCRIT: 35.3 % — AB (ref 39.0–52.0)
Hemoglobin: 12.3 g/dL — ABNORMAL LOW (ref 13.0–17.0)
LYMPHS ABS: 2.5 10*3/uL (ref 0.7–4.0)
LYMPHS PCT: 36 % (ref 12–46)
MCH: 31.8 pg (ref 26.0–34.0)
MCHC: 34.8 g/dL (ref 30.0–36.0)
MCV: 91.2 fL (ref 78.0–100.0)
MONO ABS: 1.1 10*3/uL — AB (ref 0.1–1.0)
Monocytes Relative: 15 % — ABNORMAL HIGH (ref 3–12)
NEUTROS ABS: 3.1 10*3/uL (ref 1.7–7.7)
Neutrophils Relative %: 45 % (ref 43–77)
Platelets: 275 10*3/uL (ref 150–400)
RBC: 3.87 MIL/uL — AB (ref 4.22–5.81)
RDW: 16.4 % — AB (ref 11.5–15.5)
WBC: 7 10*3/uL (ref 4.0–10.5)

## 2014-01-06 LAB — COMPREHENSIVE METABOLIC PANEL
ALK PHOS: 128 U/L — AB (ref 39–117)
ALT: 72 U/L — ABNORMAL HIGH (ref 0–53)
AST: 143 U/L — AB (ref 0–37)
Albumin: 3.7 g/dL (ref 3.5–5.2)
Anion gap: 18 — ABNORMAL HIGH (ref 5–15)
BILIRUBIN TOTAL: 0.5 mg/dL (ref 0.3–1.2)
BUN: 5 mg/dL — ABNORMAL LOW (ref 6–23)
CHLORIDE: 101 meq/L (ref 96–112)
CO2: 24 meq/L (ref 19–32)
Calcium: 9.1 mg/dL (ref 8.4–10.5)
Creatinine, Ser: 0.5 mg/dL (ref 0.50–1.35)
GFR calc Af Amer: >90 mL/min (ref >90)
GFR calc non Af Amer: >90 mL/min (ref >90)
Glucose, Bld: 97 mg/dL (ref 70–99)
POTASSIUM: 4.8 meq/L (ref 3.7–5.3)
Sodium: 143 mEq/L (ref 137–147)
Total Protein: 8.2 g/dL (ref 6.0–8.3)

## 2014-01-06 LAB — I-STAT CG4 LACTIC ACID, ED: Lactic Acid, Venous: 2.41 mmol/L — ABNORMAL HIGH (ref 0.5–2.2)

## 2014-01-06 LAB — LIPASE, BLOOD: Lipase: 115 U/L — ABNORMAL HIGH (ref 11–59)

## 2014-01-06 MED ORDER — SODIUM CHLORIDE 0.9 % IV BOLUS (SEPSIS)
1000.0000 mL | Freq: Once | INTRAVENOUS | Status: AC
Start: 2014-01-06 — End: 2014-01-06
  Administered 2014-01-06: 1000 mL via INTRAVENOUS

## 2014-01-06 MED ORDER — SODIUM CHLORIDE 0.9 % IV BOLUS (SEPSIS)
1000.0000 mL | Freq: Once | INTRAVENOUS | Status: AC
  Administered 2014-01-06: 1000 mL via INTRAVENOUS

## 2014-01-06 MED ORDER — HYDROMORPHONE HCL PF 1 MG/ML IJ SOLN
1.0000 mg | Freq: Once | INTRAMUSCULAR | Status: AC
  Administered 2014-01-06: 1 mg via INTRAVENOUS
  Filled 2014-01-06: qty 1

## 2014-01-06 MED ORDER — GI COCKTAIL ~~LOC~~
30.0000 mL | Freq: Once | ORAL | Status: AC
  Administered 2014-01-06: 30 mL via ORAL
  Filled 2014-01-06: qty 30

## 2014-01-06 MED ORDER — ONDANSETRON HCL 4 MG/2ML IJ SOLN
4.0000 mg | Freq: Once | INTRAMUSCULAR | Status: AC
  Administered 2014-01-06: 4 mg via INTRAVENOUS
  Filled 2014-01-06: qty 2

## 2014-01-06 MED ORDER — OXYCODONE-ACETAMINOPHEN 5-325 MG PO TABS
1.0000 | ORAL_TABLET | Freq: Once | ORAL | Status: AC
  Administered 2014-01-06: 1 via ORAL
  Filled 2014-01-06: qty 1

## 2014-01-06 NOTE — ED Notes (Signed)
Pt arrives via EMS with c/o abd pain, long hx of pancreatitis and states this pain/ symptoms is the same as previous. Recently hospitalized with pancreatitis, discharged on Tuesday. Pain started last night around 1200 AM, diarrhea x1 day, loose stools. Pain 8/10.

## 2014-01-06 NOTE — ED Notes (Signed)
Pt finishing bolus.

## 2014-01-06 NOTE — Discharge Instructions (Signed)
Low-Fat Diet for Pancreatitis or Gallbladder Conditions A low-fat diet can be helpful if you have pancreatitis or a gallbladder condition. With these conditions, your pancreas and gallbladder have trouble digesting fats. A healthy eating plan with less fat will help rest your pancreas and gallbladder and reduce your symptoms. WHAT DO I NEED TO KNOW ABOUT THIS DIET?  Eat a low-fat diet.  Reduce your fat intake to less than 20-30% of your total daily calories. This is less than 50-60 g of fat per day.  Remember that you need some fat in your diet. Ask your dietician what your daily goal should be.  Choose nonfat and low-fat healthy foods. Look for the words "nonfat," "low fat," or "fat free."  As a guide, look on the label and choose foods with less than 3 g of fat per serving. Eat only one serving.  Avoid alcohol.  Do not smoke. If you need help quitting, talk with your health care provider.  Eat small frequent meals instead of three large heavy meals. WHAT FOODS CAN I EAT? Grains Include healthy grains and starches such as potatoes, wheat bread, fiber-rich cereal, and brown rice. Choose whole grain options whenever possible. In adults, whole grains should account for 45-65% of your daily calories.  Fruits and Vegetables Eat plenty of fruits and vegetables. Fresh fruits and vegetables add fiber to your diet. Meats and Other Protein Sources Eat lean meat such as chicken and pork. Trim any fat off of meat before cooking it. Eggs, fish, and beans are other sources of protein. In adults, these foods should account for 10-35% of your daily calories. Dairy Choose low-fat milk and dairy options. Dairy includes fat and protein, as well as calcium.  Fats and Oils Limit high-fat foods such as fried foods, sweets, baked goods, sugary drinks.  Other Creamy sauces and condiments, such as mayonnaise, can add extra fat. Think about whether or not you need to use them, or use smaller amounts or low fat  options. WHAT FOODS ARE NOT RECOMMENDED?  High fat foods, such as:  [employer].  Ice cream.  [place] toast.  Sweet rolls.  Pizza.  Cheese bread.  Foods covered with batter, butter, creamy sauces, or cheese.  Fried foods.  Sugary drinks and desserts.  Foods that cause gas or bloating Document Released: 04/30/2013 Document Reviewed: 04/30/2013 [facility] Patient Information 2015 [place], [place]. This information is not intended to replace advice given to you by your health care provider. Make sure you discuss any questions you have with your health care provider. Chronic Pancreatitis Acute pancreatitis is a disease in which the pancreas becomes suddenly inflamed. The pancreas is a large gland located behind your stomach. The pancreas produces enzymes that help digest food. The pancreas also releases the hormones glucagon and insulin that help regulate blood sugar. Damage to the pancreas occurs when the digestive enzymes from the pancreas are activated and begin attacking the pancreas before being released into the intestine. Most acute attacks last a couple of days and can cause serious complications. Some people become dehydrated and develop low blood pressure. In severe cases, bleeding into the pancreas can lead to shock and can be life-threatening. The lungs, heart, and kidneys may fail. CAUSES  Pancreatitis can happen to anyone. In some cases, the cause is unknown. Most cases are caused by:  Alcohol abuse.  Gallstones. Other less common causes are:  Certain medicines.  Exposure to certain chemicals.  Infection.  Damage caused by an accident (trauma).  Abdominal surgery.  SYMPTOMS  °· Pain in the upper abdomen that may radiate to the back. °· Tenderness and swelling of the abdomen. °· Nausea and vomiting. °DIAGNOSIS  °Your caregiver will perform a physical exam. Blood and stool tests may be done to confirm the diagnosis. Imaging tests may also be done, such as X-rays, CT  scans, or an ultrasound of the abdomen. °TREATMENT  °Treatment usually requires a stay in the hospital. Treatment may include: °· Pain medicine. °· Fluid replacement through an intravenous line (IV). °· Placing a tube in the stomach to remove stomach contents and control vomiting. °· Not eating for 3 or 4 days. This gives your pancreas a rest, because enzymes are not being produced that can cause further damage. °· Antibiotic medicines if your condition is caused by an infection. °· Surgery of the pancreas or gallbladder. °HOME CARE INSTRUCTIONS  °· Follow the diet advised by your caregiver. This may involve avoiding alcohol and decreasing the amount of fat in your diet. °· Eat smaller, more frequent meals. This reduces the amount of digestive juices the pancreas produces. °· Drink enough fluids to keep your urine clear or pale yellow. °· Only take over-the-counter or prescription medicines as directed by your caregiver. °· Avoid drinking alcohol if it caused your condition. °· Do not smoke. °· Get plenty of rest. °· Check your blood sugar at home as directed by your caregiver. °· Keep all follow-up appointments as directed by your caregiver. °SEEK MEDICAL CARE IF:  °· You do not recover as quickly as expected. °· You develop new or worsening symptoms. °· You have persistent pain, weakness, or nausea. °· You recover and then have another episode of pain. °SEEK IMMEDIATE MEDICAL CARE IF:  °· You are unable to eat or keep fluids down. °· Your pain becomes severe. °· You have a fever or persistent symptoms for more than 2 to 3 days. °· You have a fever and your symptoms suddenly get worse. °· Your skin or the white part of your eyes turn yellow (jaundice). °· You develop vomiting. °· You feel dizzy, or you faint. °· Your blood sugar is high (over 300 mg/dL). °MAKE SURE YOU:  °· Understand these instructions. °· Will watch your condition. °· Will get help right away if you are not doing well or get worse. °Document  Released: 04/25/2005 Document Revised: 10/25/2011 Document Reviewed: 08/04/2011 °[employer]® Patient Information ©2015 [employer]. This information is not intended to replace advice given to you by your health care provider. Make sure you discuss any questions you have with your health care provider. ° °

## 2014-01-06 NOTE — ED Notes (Signed)
MD at bedside. 

## 2014-01-06 NOTE — ED Provider Notes (Signed)
CSN: [id number]     Arrival date & time 01/06/14  0501 History   First MD Initiated Contact with Patient 01/06/14 [id number]     Chief Complaint  Patient presents with  . 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 Reports pain is in the epigastrium and left upper quadrant. It is consistent with his prior abdominal pain associated with his pancreatitis. He was recently discharged on August 25 after being admitted for approximately one week for chronic pancreatitis. He reports that he has been taking oxycodone, Zofran, and Creon without relief. He reports his current pain is 8/10. It is nonradiating. Nothing makes it better or worse. He has associated nausea and vomiting. He also reports diarrhea. Vomiting is nonbilious, nonbloody. He denies any fevers.  Past Medical History  Diagnosis Date  . Hypertension   . Asthma   . Pancreatitis   . Cocaine abuse   . Depression   . H/O suicide attempt   . Heart murmur     "when he was little" (03/06/2013)  . Shortness of breath     "can happen at anytime" (03/06/2013)  . Anemia   . H/O hiatal hernia   . GERD (gastroesophageal reflux disease)   . Arthritis     "knees" (03/06/2013)  . Anxiety   . WPW (Wolff-Parkinson-White syndrome)     [name] 03/06/2013   Past Surgical History  Procedure Laterality Date  . Facial fracture surgery Left 1990's    "result of trauma" (03/06/2013)  . Eye surgery Left 1990's    "result of trauma" (03/06/2013)   Family History  Problem Relation Age of Onset  . Hypertension Other   . Coronary artery disease Other    History  Substance Use Topics  . Smoking status: Current Every Day Smoker -- 1.50 packs/day for 30 years    Types: Cigarettes  . Smokeless tobacco: Current User  . Alcohol Use: Yes   Comment: 12 pk beer daily 12oz cans liquor occasionally    Review of Systems  Constitutional: Negative.  Negative for fever.  Respiratory: Negative.  Negative for chest tightness and shortness of breath.   Cardiovascular: Negative.  Negative for chest pain.  Gastrointestinal: Positive for nausea, vomiting, abdominal pain and diarrhea. Negative for blood in stool.  Genitourinary: Negative.  Negative for dysuria.  Musculoskeletal: Negative for back pain.  All other systems reviewed and are negative.     Allergies  Trazodone and nefazodone and Shellfish-derived products  Home Medications   Prior to Admission medications   Medication Sig Start Date End Date Taking? Authorizing Provider  acetaminophen (TYLENOL) 500 MG tablet Take 1,000 mg by mouth every 6 (six) hours as needed for mild pain.   Yes Historical Provider, MD  albuterol (PROVENTIL HFA;VENTOLIN HFA) 108 (90 BASE) MCG/ACT inhaler Inhale 1-2 puffs into the lungs every 6 (six) hours as needed for wheezing or shortness of breath. 11/26/13  Yes Shanker Kristeen Mans, MD  Alum & Mag Hydroxide-Simeth (GI COCKTAIL) SUSP suspension Take 30 mLs by mouth 3 (three) times daily as needed for indigestion. Shake well. 11/28/13  Yes [name], MD  feeding supplement, ENSURE COMPLETE, (ENSURE COMPLETE) LIQD Take 237 mLs by mouth 2 (two) times daily between meals. 11/26/13  Yes Shanker Kristeen Mans, MD  folic acid (FOLVITE) 1 MG tablet Take 1 tablet (1 mg total) by mouth  daily. 11/26/13  Yes Shanker Kristeen Mans, MD  lipase/protease/amylase (CREON) 12000 UNITS CPEP capsule Take 1 capsule (12,000 Units total) by mouth 3 (three) times daily before meals. 12/31/13  Yes Adeline Saralyn Pilar, MD  metoprolol (LOPRESSOR) 50 MG tablet Take 1 tablet (50 mg total) by mouth 2 (two) times daily. For high blood pressure 11/28/13  Yes [name], MD  Multiple Vitamin (MULTIVITAMIN WITH MINERALS) TABS tablet Take 1 tablet by mouth daily. For low vitamin 11/28/13   Yes Olugbemiga E Doreene Burke, MD  ondansetron (ZOFRAN) 4 MG tablet Take 1 tablet (4 mg total) by mouth every 6 (six) hours. 12/09/13  Yes Ezequiel Essex, MD  oxyCODONE (OXY IR/ROXICODONE) 5 MG immediate release tablet Take 1-2 tablets (5-10 mg total) by mouth every 6 (six) hours as needed for severe pain. 12/31/13  Yes Adeline Saralyn Pilar, MD  pantoprazole (PROTONIX) 40 MG tablet Take 1 tablet (40 mg total) by mouth 2 (two) times daily. 11/28/13  Yes [name], MD  QUEtiapine (SEROQUEL) 100 MG tablet Take 100 mg by mouth at bedtime. For mood control 11/28/13  Yes [name], MD  sertraline (ZOLOFT) 25 MG tablet Take 3 tablets (75 mg total) by mouth daily. For depression 11/28/13  Yes Olugbemiga Essie Christine, MD  thiamine 100 MG tablet Take 1 tablet (100 mg total) by mouth daily. 11/28/13  Yes Olugbemiga Essie Christine, MD   BP 149/91  Pulse 97  Temp(Src) 98.6 F (37 C) (Oral)  Resp 18  Ht 5\' 9"  (1.753 m)  Wt 140 lb (63.504 kg)  BMI 20.67 kg/m2  SpO2 100% Physical Exam  Nursing note and vitals reviewed. Constitutional: He is oriented to person, place, and time.  Thin  HENT:  Head: Normocephalic and atraumatic.  Mouth/Throat: Oropharynx is clear and moist.  Eyes: Pupils are equal, round, and reactive to light.  Neck: Neck supple.  Cardiovascular: Regular rhythm and normal heart sounds.   No murmur heard. Tachycardia  Pulmonary/Chest: Effort normal and breath sounds normal. No respiratory distress. He has no wheezes.  Abdominal: Soft. Bowel sounds are normal. There is tenderness. There is no rebound and no guarding.  Mild tenderness to palpation of the epigastrium  Musculoskeletal: He exhibits no edema.  Neurological: He is alert and oriented to person, place, and time.  Skin: Skin is warm and dry.  Psychiatric: He has a normal mood and affect.    ED Course  Procedures (including critical care time) Labs Review Labs Reviewed  CBC WITH DIFFERENTIAL - Abnormal; Notable for the  following:    RBC 3.87 (*)    Hemoglobin 12.3 (*)    HCT 35.3 (*)    RDW 16.4 (*)    Monocytes Relative 15 (*)    Monocytes Absolute 1.1 (*)    All other components within normal limits  COMPREHENSIVE METABOLIC PANEL - Abnormal; Notable for the following:    BUN 5 (*)    AST 143 (*)    ALT 72 (*)    Alkaline Phosphatase 128 (*)    Anion gap 18 (*)    All other components within normal limits  LIPASE, BLOOD - Abnormal; Notable for the following:    Lipase 115 (*)    All other components within normal limits  I-STAT CG4 LACTIC ACID, ED - Abnormal; Notable for the following:    Lactic Acid, Venous 2.41 (*)    All other components within normal limits    Imaging Review No results found.   EKG Interpretation  None      MDM   Final diagnoses:  Alcohol-induced chronic pancreatitis    Patient presents with pain and vomiting consistent with prior episodes of pancreatitis. He is nontoxic on exam. Vital signs notable for tachycardia to 105. Prior visits, patient has been notably tachycardic.  Patient was given fluids, pain medication, and anti-medic. No active vomiting noted while in the ER. Basic labwork. Lipase is 115 mildly elevated when compared to discharge.  Patient has received 2 L of fluid and multiple doses of pain medication. He was transitioned to oral pain medication and was able to tolerate fluids. He reports that he is feeling somewhat better. He'll be discharged home.  After history, exam, and medical workup I feel the patient has been appropriately medically screened and is safe for discharge home. Pertinent diagnoses were discussed with the patient. Patient was given return precautions.     Merryl Hacker, MD 01/07/14 [phone]

## 2014-01-06 NOTE — ED Notes (Signed)
Lactic acid results given to Dr. Dina Rich

## 2014-01-09 ENCOUNTER — Inpatient Hospital Stay (HOSPITAL_COMMUNITY): Payer: Self-pay

## 2014-01-09 ENCOUNTER — Inpatient Hospital Stay: Payer: Self-pay | Admitting: Internal Medicine

## 2014-01-09 ENCOUNTER — Encounter (HOSPITAL_COMMUNITY): Payer: Self-pay | Admitting: Emergency Medicine

## 2014-01-09 ENCOUNTER — Inpatient Hospital Stay (HOSPITAL_COMMUNITY)
Admission: EM | Admit: 2014-01-09 | Discharge: 2014-01-15 | DRG: 438 | Disposition: A | Discharge status: Home / Self Care | Payer: Self-pay | Attending: Internal Medicine | Admitting: Internal Medicine

## 2014-01-09 DIAGNOSIS — E43 Unspecified severe protein-calorie malnutrition: Secondary | ICD-10-CM

## 2014-01-09 DIAGNOSIS — K859 Acute pancreatitis without necrosis or infection, unspecified: Principal | ICD-10-CM | POA: Diagnosis present

## 2014-01-09 DIAGNOSIS — IMO0002 Reserved for concepts with insufficient information to code with codable children: Secondary | ICD-10-CM

## 2014-01-09 DIAGNOSIS — F1014 Alcohol abuse with alcohol-induced mood disorder: Secondary | ICD-10-CM

## 2014-01-09 DIAGNOSIS — K861 Other chronic pancreatitis: Secondary | ICD-10-CM

## 2014-01-09 DIAGNOSIS — K7010 Alcoholic hepatitis without ascites: Secondary | ICD-10-CM | POA: Diagnosis present

## 2014-01-09 DIAGNOSIS — E41 Nutritional marasmus: Secondary | ICD-10-CM | POA: Diagnosis present

## 2014-01-09 DIAGNOSIS — F329 Major depressive disorder, single episode, unspecified: Secondary | ICD-10-CM

## 2014-01-09 DIAGNOSIS — F1020 Alcohol dependence, uncomplicated: Secondary | ICD-10-CM | POA: Diagnosis present

## 2014-01-09 DIAGNOSIS — F32A Depression, unspecified: Secondary | ICD-10-CM

## 2014-01-09 DIAGNOSIS — F1010 Alcohol abuse, uncomplicated: Secondary | ICD-10-CM

## 2014-01-09 DIAGNOSIS — R109 Unspecified abdominal pain: Secondary | ICD-10-CM

## 2014-01-09 DIAGNOSIS — F3289 Other specified depressive episodes: Secondary | ICD-10-CM

## 2014-01-09 DIAGNOSIS — K852 Alcohol induced acute pancreatitis without necrosis or infection: Secondary | ICD-10-CM

## 2014-01-09 DIAGNOSIS — K862 Cyst of pancreas: Secondary | ICD-10-CM

## 2014-01-09 DIAGNOSIS — I456 Pre-excitation syndrome: Secondary | ICD-10-CM

## 2014-01-09 DIAGNOSIS — K863 Pseudocyst of pancreas: Secondary | ICD-10-CM

## 2014-01-09 DIAGNOSIS — R000 Tachycardia, unspecified: Secondary | ICD-10-CM | POA: Diagnosis present

## 2014-01-09 DIAGNOSIS — I10 Essential (primary) hypertension: Secondary | ICD-10-CM

## 2014-01-09 LAB — COMPREHENSIVE METABOLIC PANEL
ALK PHOS: 135 U/L — AB (ref 39–117)
ALT: 56 U/L — ABNORMAL HIGH (ref 0–53)
ANION GAP: 20 — AB (ref 5–15)
AST: 82 U/L — ABNORMAL HIGH (ref 0–37)
Albumin: 3.9 g/dL (ref 3.5–5.2)
BUN: 5 mg/dL — AB (ref 6–23)
CO2: 22 mEq/L (ref 19–32)
Calcium: 9.1 mg/dL (ref 8.4–10.5)
Chloride: 98 mEq/L (ref 96–112)
Creatinine, Ser: 0.56 mg/dL (ref 0.50–1.35)
GFR calc non Af Amer: >90 mL/min (ref >90)
Glucose, Bld: 101 mg/dL — ABNORMAL HIGH (ref 70–99)
POTASSIUM: 4.1 meq/L (ref 3.7–5.3)
Sodium: 140 mEq/L (ref 137–147)
TOTAL PROTEIN: 8.3 g/dL (ref 6.0–8.3)
Total Bilirubin: 0.5 mg/dL (ref 0.3–1.2)

## 2014-01-09 LAB — RAPID URINE DRUG SCREEN, HOSP PERFORMED
AMPHETAMINES: NOT DETECTED
Barbiturates: POSITIVE — AB
Benzodiazepines: NOT DETECTED
COCAINE: NOT DETECTED
Opiates: NOT DETECTED
TETRAHYDROCANNABINOL: NOT DETECTED

## 2014-01-09 LAB — CBC WITH DIFFERENTIAL/PLATELET
Basophils Absolute: 0 10*3/uL (ref 0.0–0.1)
Basophils Relative: 0 % (ref 0–1)
EOS PCT: 5 % (ref 0–5)
Eosinophils Absolute: 0.4 10*3/uL (ref 0.0–0.7)
HEMATOCRIT: 35 % — AB (ref 39.0–52.0)
HEMOGLOBIN: 12.3 g/dL — AB (ref 13.0–17.0)
LYMPHS ABS: 2.3 10*3/uL (ref 0.7–4.0)
Lymphocytes Relative: 32 % (ref 12–46)
MCH: 31.2 pg (ref 26.0–34.0)
MCHC: 35.1 g/dL (ref 30.0–36.0)
MCV: 88.8 fL (ref 78.0–100.0)
MONO ABS: 0.5 10*3/uL (ref 0.1–1.0)
Monocytes Relative: 7 % (ref 3–12)
NEUTROS PCT: 56 % (ref 43–77)
Neutro Abs: 4.1 10*3/uL (ref 1.7–7.7)
Platelets: 217 10*3/uL (ref 150–400)
RBC: 3.94 MIL/uL — AB (ref 4.22–5.81)
RDW: 16.5 % — ABNORMAL HIGH (ref 11.5–15.5)
WBC: 7.3 10*3/uL (ref 4.0–10.5)

## 2014-01-09 LAB — LIPASE, BLOOD: Lipase: 94 U/L — ABNORMAL HIGH (ref 11–59)

## 2014-01-09 MED ORDER — SODIUM CHLORIDE 0.9 % IJ SOLN
3.0000 mL | Freq: Two times a day (BID) | INTRAMUSCULAR | Status: DC
  Administered 2014-01-11 – 2014-01-15 (×5): 3 mL via INTRAVENOUS

## 2014-01-09 MED ORDER — DEXTROSE-NACL 5-0.9 % IV SOLN
INTRAVENOUS | Status: DC
  Administered 2014-01-09 – 2014-01-11 (×5): via INTRAVENOUS

## 2014-01-09 MED ORDER — THIAMINE HCL 100 MG/ML IJ SOLN
100.0000 mg | Freq: Every day | INTRAMUSCULAR | Status: DC
  Administered 2014-01-09 – 2014-01-10 (×2): 100 mg via INTRAVENOUS
  Filled 2014-01-09 (×2): qty 1

## 2014-01-09 MED ORDER — OXYCODONE HCL 5 MG PO TABS
5.0000 mg | ORAL_TABLET | Freq: Four times a day (QID) | ORAL | Status: DC | PRN
  Administered 2014-01-11: 10 mg via ORAL
  Filled 2014-01-09: qty 2

## 2014-01-09 MED ORDER — OXYCODONE-ACETAMINOPHEN 5-325 MG PO TABS
2.0000 | ORAL_TABLET | Freq: Once | ORAL | Status: AC
  Administered 2014-01-09: 2 via ORAL
  Filled 2014-01-09: qty 2

## 2014-01-09 MED ORDER — IOHEXOL 300 MG/ML  SOLN
25.0000 mL | INTRAMUSCULAR | Status: DC
  Administered 2014-01-09: 25 mL via ORAL

## 2014-01-09 MED ORDER — HYDROMORPHONE HCL PF 1 MG/ML IJ SOLN
0.5000 mg | Freq: Once | INTRAMUSCULAR | Status: AC
  Administered 2014-01-09: 0.5 mg via INTRAVENOUS
  Filled 2014-01-09: qty 1

## 2014-01-09 MED ORDER — PANTOPRAZOLE SODIUM 40 MG IV SOLR
40.0000 mg | Freq: Two times a day (BID) | INTRAVENOUS | Status: DC
  Administered 2014-01-09 – 2014-01-10 (×3): 40 mg via INTRAVENOUS
  Filled 2014-01-09 (×4): qty 40

## 2014-01-09 MED ORDER — ONDANSETRON HCL 4 MG PO TABS: 4.0000 mg | ORAL_TABLET | Freq: Four times a day (QID) | ORAL | Status: DC | PRN

## 2014-01-09 MED ORDER — ALBUTEROL SULFATE (2.5 MG/3ML) 0.083% IN NEBU: 2.5000 mg | INHALATION_SOLUTION | Freq: Four times a day (QID) | RESPIRATORY_TRACT | Status: DC | PRN

## 2014-01-09 MED ORDER — ACETAMINOPHEN 325 MG PO TABS: 650.0000 mg | ORAL_TABLET | Freq: Four times a day (QID) | ORAL | Status: DC | PRN

## 2014-01-09 MED ORDER — ONDANSETRON HCL 4 MG/2ML IJ SOLN: 4.0000 mg | Freq: Four times a day (QID) | INTRAMUSCULAR | Status: DC | PRN

## 2014-01-09 MED ORDER — HYDROMORPHONE HCL PF 1 MG/ML IJ SOLN
1.0000 mg | Freq: Once | INTRAMUSCULAR | Status: AC
  Administered 2014-01-09: 1 mg via INTRAVENOUS
  Filled 2014-01-09: qty 1

## 2014-01-09 MED ORDER — ACETAMINOPHEN 650 MG RE SUPP: 650.0000 mg | Freq: Four times a day (QID) | RECTAL | Status: DC | PRN

## 2014-01-09 MED ORDER — HYDROMORPHONE HCL PF 2 MG/ML IJ SOLN: 1.0000 mg | INTRAMUSCULAR | Status: DC | PRN

## 2014-01-09 MED ORDER — SERTRALINE HCL 50 MG PO TABS
75.0000 mg | ORAL_TABLET | Freq: Every day | ORAL | Status: DC
  Administered 2014-01-09 – 2014-01-15 (×7): 75 mg via ORAL
  Filled 2014-01-09 (×8): qty 1

## 2014-01-09 MED ORDER — ONDANSETRON HCL 4 MG/2ML IJ SOLN
4.0000 mg | Freq: Once | INTRAMUSCULAR | Status: AC
  Administered 2014-01-09: 4 mg via INTRAVENOUS
  Filled 2014-01-09: qty 2

## 2014-01-09 MED ORDER — HYDROMORPHONE HCL PF 1 MG/ML IJ SOLN
1.0000 mg | INTRAMUSCULAR | Status: DC | PRN
  Administered 2014-01-09 – 2014-01-11 (×14): 1 mg via INTRAVENOUS
  Filled 2014-01-09 (×14): qty 1

## 2014-01-09 MED ORDER — FOLIC ACID 5 MG/ML IJ SOLN
1.0000 mg | Freq: Every day | INTRAMUSCULAR | Status: DC
  Administered 2014-01-09 – 2014-01-10 (×2): 1 mg via INTRAVENOUS
  Filled 2014-01-09 (×2): qty 0.2

## 2014-01-09 MED ORDER — SODIUM CHLORIDE 0.9 % IV BOLUS (SEPSIS)
1000.0000 mL | Freq: Once | INTRAVENOUS | Status: AC
  Administered 2014-01-09: 1000 mL via INTRAVENOUS

## 2014-01-09 MED ORDER — GI COCKTAIL ~~LOC~~
30.0000 mL | Freq: Once | ORAL | Status: AC
  Administered 2014-01-09: 30 mL via ORAL
  Filled 2014-01-09: qty 30

## 2014-01-09 MED ORDER — METOPROLOL TARTRATE 50 MG PO TABS
50.0000 mg | ORAL_TABLET | Freq: Two times a day (BID) | ORAL | Status: DC
  Administered 2014-01-09 – 2014-01-15 (×13): 50 mg via ORAL
  Filled 2014-01-09 (×15): qty 1

## 2014-01-09 MED ORDER — QUETIAPINE FUMARATE 100 MG PO TABS
100.0000 mg | ORAL_TABLET | Freq: Every day | ORAL | Status: DC
  Administered 2014-01-09 – 2014-01-14 (×6): 100 mg via ORAL
  Filled 2014-01-09 (×7): qty 1

## 2014-01-09 MED ORDER — ENOXAPARIN SODIUM 40 MG/0.4ML ~~LOC~~ SOLN
40.0000 mg | SUBCUTANEOUS | Status: DC
  Administered 2014-01-09 – 2014-01-14 (×6): 40 mg via SUBCUTANEOUS
  Filled 2014-01-09 (×7): qty 0.4

## 2014-01-09 MED ORDER — IOHEXOL 300 MG/ML  SOLN
80.0000 mL | Freq: Once | INTRAMUSCULAR | Status: AC | PRN
  Administered 2014-01-09: 80 mL via INTRAVENOUS

## 2014-01-09 MED ORDER — PANCRELIPASE (LIP-PROT-AMYL) 12000-38000 UNITS PO CPEP
12000.0000 [IU] | ORAL_CAPSULE | Freq: Three times a day (TID) | ORAL | Status: DC
  Administered 2014-01-09 – 2014-01-15 (×16): 12000 [IU] via ORAL
  Filled 2014-01-09 (×21): qty 1

## 2014-01-09 NOTE — Progress Notes (Signed)
Pt NPO at this time. Patient attempting to get broth from patient nourishment area. Also found bottle of water and chocolate cupcakes in patients room. Pt states "I'm just hungry" education provided. Pt verbalized understanding. Will attempted to move patient to camera room for close monitoring at this time.

## 2014-01-09 NOTE — ED Notes (Signed)
CT called and notified that pt has finished oral contrast.

## 2014-01-09 NOTE — ED Notes (Addendum)
Patient presents today with LOQ abdominal pain, nausea, and emesis x2. Denies back or shoulder pain. States last drink 1 week ago.

## 2014-01-09 NOTE — Progress Notes (Signed)
NURSING PROGRESS NOTE  [name] [id number] Admission Data: 01/09/2014 5:12 PM Attending Provider: Elmarie Shiley, MD OEV:[id number], Gabrielle Dare, MD Code Status: FULL  [name] is a 43 y.o. male patient admitted from ED:  -No acute distress noted.  -No complaints of shortness of breath.  -No complaints of chest pain.   Cardiac Monitoring: Box # 03 in place. Cardiac monitor yields:normal sinus rhythm.  Blood pressure 140/100, pulse 78, temperature 98.8 F (37.1 C), temperature source Oral, resp. rate 16, height 5\' 9"  (1.753 m), weight 63.504 kg (140 lb), SpO2 100.00%.   IV Fluids:  IV in place, occlusive dsg intact without redness, IV cath forearm left, condition patent and no redness D5W/0.9 NaCl.   Allergies:  Trazodone and nefazodone and Shellfish-derived products  Past Medical History:   has a past medical history of Hypertension; Asthma; Pancreatitis; Cocaine abuse; Depression; H/O suicide attempt; Heart murmur; Shortness of breath; Anemia; H/O hiatal hernia; GERD (gastroesophageal reflux disease); Arthritis; Anxiety; and WPW (Wolff-Parkinson-White syndrome).  Past Surgical History:   has past surgical history that includes Facial fracture surgery (Left, 1990's) and Eye surgery (Left, 1990's).  Skin: intact  Patient orientated to room. Information packet given to patient. Admission inpatient armband information verified with patient/family to include name and date of birth and placed on patient arm. Side rails up x 2, fall assessment and education completed with patient. Patient able to verbalize understanding of risk associated with falls and verbalized understanding to call for assistance before getting out of bed. Call light within reach. Patient able to voice and demonstrate understanding of unit orientation instructions.    Will continue to evaluate and treat per MD orders.  Wallie Renshaw, RN

## 2014-01-09 NOTE — ED Notes (Signed)
Pt walked to bathroom with no difficulties. Pt stated that he urinated and had diarrhea.

## 2014-01-09 NOTE — ED Provider Notes (Signed)
Patient presents for abdominal pain he states is consistent with his prior bouts of pancreatitis.  Normally due to alcohol but says he has not drank in the last 2 days.  He does admit to eating fried chicken and pizza.  Patient was educated on fatty meals making his pancreatitis worse as well.  Will obtain labs, pain/nausea control.  Anticipate discharge if pain is under control and he can tolerate PO.  Patient is non-toxic appearing with vital signs at his normal baseline.  Medical screening examination/treatment/procedure(s) were conducted as a shared visit with non-physician practitioner(s) and myself.  I personally evaluated the patient during the encounter.   EKG Interpretation None        Everlene Balls, MD 01/09/14 1824

## 2014-01-09 NOTE — Progress Notes (Signed)
Utilization review completed.  

## 2014-01-09 NOTE — ED Provider Notes (Signed)
CSN: [id number]     Arrival date & time 01/09/14  6384 History   First MD Initiated Contact with Patient 01/09/14 0601     Chief Complaint  Patient presents with  . Abdominal Pain     (Consider location/radiation/quality/duration/timing/severity/associated sxs/prior Treatment) HPI Comments: Patient with chronic pancreatitis 2/2 etoh, presents to the ED with a chief complaint of abdominal pain.  He states that the pain started acting up again last night.  He has been seen several times in the past week for the same.  He denies any alcohol use after his last discharge.  He states that he did eat some pizza, and wonders if that set him off.  He has been taking creon as directed.  He states that his pain is 8/10.  It is worsened with eating and palpation.  He reports associated nausea, vomiting, and diarrhea.  He denies any fevers, chills, or chest pain.  He has not taken anything to alleviate his symptoms.  The history is provided by the patient. No language interpreter was used.    Past Medical History  Diagnosis Date  . Hypertension   . Asthma   . Pancreatitis   . Cocaine abuse   . Depression   . H/O suicide attempt   . Heart murmur     "when he was little" (03/06/2013)  . Shortness of breath     "can happen at anytime" (03/06/2013)  . Anemia   . H/O hiatal hernia   . GERD (gastroesophageal reflux disease)   . Arthritis     "knees" (03/06/2013)  . Anxiety   . WPW (Wolff-Parkinson-White syndrome)     [name] 03/06/2013   Past Surgical History  Procedure Laterality Date  . Facial fracture surgery Left 1990's    "result of trauma" (03/06/2013)  . Eye surgery Left 1990's    "result of trauma" (03/06/2013)   Family History  Problem Relation Age of Onset  . Hypertension Other   . Coronary artery disease Other    History  Substance Use Topics  . Smoking status: Current Every Day Smoker -- 1.50 packs/day for 30 years    Types: Cigarettes  . Smokeless tobacco: Current User  .  Alcohol Use: Yes     Comment: 12 pk beer daily 12oz cans liquor occasionally    Review of Systems  Constitutional: Negative for fever and chills.  Respiratory: Negative for shortness of breath.   Cardiovascular: Negative for chest pain.  Gastrointestinal: Positive for nausea, vomiting, abdominal pain and diarrhea.  All other systems reviewed and are negative.     Allergies  Trazodone and nefazodone and Shellfish-derived products  Home Medications   Prior to Admission medications   Medication Sig Start Date End Date Taking? Authorizing Provider  acetaminophen (TYLENOL) 500 MG tablet Take 1,000 mg by mouth every 6 (six) hours as needed for mild pain.    Historical Provider, MD  albuterol (PROVENTIL HFA;VENTOLIN HFA) 108 (90 BASE) MCG/ACT inhaler Inhale 1-2 puffs into the lungs every 6 (six) hours as needed for wheezing or shortness of breath. 11/26/13   Jonetta Osgood, MD  Alum & Mag Hydroxide-Simeth (GI COCKTAIL) SUSP suspension Take 30 mLs by mouth 3 (three) times daily as needed for indigestion. Shake well. 11/28/13   [name], MD  feeding supplement, ENSURE COMPLETE, (ENSURE COMPLETE) LIQD Take 237 mLs by mouth 2 (two) times daily between meals. 11/26/13   Shanker Kristeen Mans, MD  folic acid (FOLVITE) 1 MG tablet Take 1 tablet (1  mg total) by mouth daily. 11/26/13   Shanker Kristeen Mans, MD  lipase/protease/amylase (CREON) 12000 UNITS CPEP capsule Take 1 capsule (12,000 Units total) by mouth 3 (three) times daily before meals. 12/31/13   Sheila Oats, MD  metoprolol (LOPRESSOR) 50 MG tablet Take 1 tablet (50 mg total) by mouth 2 (two) times daily. For high blood pressure 11/28/13   [name], MD  Multiple Vitamin (MULTIVITAMIN WITH MINERALS) TABS tablet Take 1 tablet by mouth daily. For low vitamin 11/28/13   [name], MD  ondansetron (ZOFRAN) 4 MG tablet Take 1 tablet (4 mg total) by mouth every 6 (six) hours. 12/09/13   Ezequiel Essex, MD  oxyCODONE (OXY  IR/ROXICODONE) 5 MG immediate release tablet Take 1-2 tablets (5-10 mg total) by mouth every 6 (six) hours as needed for severe pain. 12/31/13   Sheila Oats, MD  pantoprazole (PROTONIX) 40 MG tablet Take 1 tablet (40 mg total) by mouth 2 (two) times daily. 11/28/13   [name], MD  QUEtiapine (SEROQUEL) 100 MG tablet Take 100 mg by mouth at bedtime. For mood control 11/28/13   [name], MD  sertraline (ZOLOFT) 25 MG tablet Take 3 tablets (75 mg total) by mouth daily. For depression 11/28/13   [name], MD  thiamine 100 MG tablet Take 1 tablet (100 mg total) by mouth daily. 11/28/13   [name], MD   BP 136/97  Pulse 97  Temp(Src) 98.7 F (37.1 C) (Oral)  Resp 12  Ht 5\' 9"  (1.753 m)  Wt 140 lb (63.504 kg)  BMI 20.67 kg/m2  SpO2 100% Physical Exam  Nursing note and vitals reviewed. Constitutional: He is oriented to person, place, and time. He appears well-developed and well-nourished.  HENT:  Head: Normocephalic and atraumatic.  Eyes: Conjunctivae and EOM are normal. Pupils are equal, round, and reactive to light. Right eye exhibits no discharge. Left eye exhibits no discharge. No scleral icterus.  Neck: Normal range of motion. Neck supple. No JVD present.  Cardiovascular: Normal rate, regular rhythm and normal heart sounds.  Exam reveals no gallop and no friction rub.   No murmur heard. Pulmonary/Chest: Effort normal and breath sounds normal. No respiratory distress. He has no wheezes. He has no rales. He exhibits no tenderness.  Abdominal: Soft. He exhibits no distension and no mass. There is tenderness. There is no rebound and no guarding.  ttp in the epigastric region, no other focal abdominal tenderness  Musculoskeletal: Normal range of motion. He exhibits no edema and no tenderness.  Neurological: He is alert and oriented to person, place, and time.  Skin: Skin is warm and dry.  Psychiatric: He has a normal mood and affect. His behavior is  normal. Judgment and thought content normal.    ED Course  Procedures (including critical care time) Results for orders placed during the hospital encounter of 01/09/14  CBC WITH DIFFERENTIAL      Result Value Ref Range   WBC 7.3  4.0 - 10.5 K/uL   RBC 3.94 (*) 4.22 - 5.81 MIL/uL   Hemoglobin 12.3 (*) 13.0 - 17.0 g/dL   HCT 35.0 (*) 39.0 - 52.0 %   MCV 88.8  78.0 - 100.0 fL   MCH 31.2  26.0 - 34.0 pg   MCHC 35.1  30.0 - 36.0 g/dL   RDW 16.5 (*) 11.5 - 15.5 %   Platelets 217  150 - 400 K/uL   Neutrophils Relative % 56  43 -  77 %   Neutro Abs 4.1  1.7 - 7.7 K/uL   Lymphocytes Relative 32  12 - 46 %   Lymphs Abs 2.3  0.7 - 4.0 K/uL   Monocytes Relative 7  3 - 12 %   Monocytes Absolute 0.5  0.1 - 1.0 K/uL   Eosinophils Relative 5  0 - 5 %   Eosinophils Absolute 0.4  0.0 - 0.7 K/uL   Basophils Relative 0  0 - 1 %   Basophils Absolute 0.0  0.0 - 0.1 K/uL  COMPREHENSIVE METABOLIC PANEL      Result Value Ref Range   Sodium 140  137 - 147 mEq/L   Potassium 4.1  3.7 - 5.3 mEq/L   Chloride 98  96 - 112 mEq/L   CO2 22  19 - 32 mEq/L   Glucose, Bld 101 (*) 70 - 99 mg/dL   BUN 5 (*) 6 - 23 mg/dL   Creatinine, Ser 0.56  0.50 - 1.35 mg/dL   Calcium 9.1  8.4 - 10.5 mg/dL   Total Protein 8.3  6.0 - 8.3 g/dL   Albumin 3.9  3.5 - 5.2 g/dL   AST 82 (*) 0 - 37 U/L   ALT 56 (*) 0 - 53 U/L   Alkaline Phosphatase 135 (*) 39 - 117 U/L   Total Bilirubin 0.5  0.3 - 1.2 mg/dL   GFR calc non Af Amer >90  >90 mL/min   GFR calc Af Amer >90  >90 mL/min   Anion gap 20 (*) 5 - 15  LIPASE, BLOOD      Result Value Ref Range   Lipase 94 (*) 11 - 59 U/L   Ct Abdomen Pelvis W Contrast  12/26/2013   ADDENDUM REPORT: 12/26/2013 20:05  ADDENDUM: Voice recognition error: The third impression should read "Mild thickening in urinary bladder wall raises question of a degree of cystitis". Original dictation by Dr. Jasmine December, addendum by Dr. Leonia Reeves  Findings conveyed toBurke Thompsonon 12/26/2013  at20:04.    Electronically Signed   By: Suzy Bouchard M.D.   On: 12/26/2013 20:05   12/26/2013   CLINICAL DATA:  Acute pancreatitis  EXAM: CT ABDOMEN AND PELVIS WITH CONTRAST  TECHNIQUE: Multidetector CT imaging of the abdomen and pelvis was performed using the standard protocol following bolus administration of intravenous contrast. Oral contrast was also administered.  CONTRAST:  170mL OMNIPAQUE IOHEXOL 300 MG/ML  SOLN  COMPARISON:  August 17, 2012  FINDINGS: Lung bases are clear.  Liver is prominent, measuring 17.9 cm in length. There is hepatic steatosis. No focal liver lesions are identified. There is no biliary duct dilatation. Gallbladder wall is not thickened.  Spleen and adrenals appear normal.  There is moderate inflammatory change surrounding the pancreas, primarily in the head and body regions. There is edema in the uncinate process of the pancreas. There is an apparent pseudocyst in the uncinate process of the pancreas measuring 2.1 by 0.8 cm. There are scattered areas of focal pancreatic duct dilatation. There is no calcification or non cystic mass in the pancreas. Inflammation is seen abutting the second and third portions of the duodenum adjacent to the pancreas with mild wall thickening in this area consistent with a degree of duodenitis.  Kidneys bilaterally show no evidence of mass or hydronephrosis. There is no renal or ureteral calculus on either side.  In the pelvis, there is mild wall thickening of the urinary bladder. There is no pelvic mass or fluid collection. The appendix appears normal. Terminal  ileum appears normal.  There is no appreciable bowel obstruction. No free air or portal venous air.  There is no ascites, adenopathy, or abscess in the abdomen or pelvis. There is atherosclerotic change in the aorta and iliac arteries without aneurysm. There is degenerative change at L5-S1 with vacuum phenomenon at this level. There are no blastic or lytic bone lesions. There is broad-based disc bulging at  L4-5 and L5-S1.  IMPRESSION: Evidence of acute pancreatitis with mild peripancreatic fluid an apparent small pseudocyst in the uncinate process region. There is adjacent duodenitis due to the pancreatitis immediately adjacent to the duodenum in the second and third portions.  Appendix appears normal. No bowel obstruction. No abscess. No renal or ureteral calculus. No hydronephrosis.  Mild thickening in urinary bladder wall raises question of a degree of appendicitis.  Liver enlarged with hepatic steatosis.  Electronically Signed: By: Lowella Grip M.D. On: 12/26/2013 11:41      EKG Interpretation None     ED ECG REPORT  I personally interpreted this EKG   Date: 01/09/2014   Rate: 100  Rhythm: sinus tachycardia  QRS Axis: normal  Intervals: normal  ST/T Wave abnormalities: normal  Conduction Disutrbances:none  Narrative Interpretation:   Old EKG Reviewed: unchanged   MDM   Final diagnoses:  Alcohol-induced acute pancreatitis    Patient with chronic pancreatitis. Recently admitted for acute on chronic pancreatitis. Will check labs, give fluids, treat pain. Will reassess.  10:10 AM Patient reassessed multiple times.  He has tolerated oral intake, but states that his pain worsens significantly when he drinks fluids.  Will give additional pain medicine.  If patient still in pain after this, will admit to medicine for pain control.  11:33 AM Patient discussed with Dr. Tyrell Antonio from [facility], who will admit the patient.  Recommends repeat CT.    Montine Circle, PA-C 01/09/14 1135

## 2014-01-09 NOTE — ED Notes (Signed)
RN unable to get blood from IV start

## 2014-01-09 NOTE — ED Notes (Signed)
Pt states drinking ginger ale is increasing his pain and causing him to have diarrhea.

## 2014-01-09 NOTE — H&P (Signed)
[facility] History and Physical  [name] KXF:[id number] DOB: December 20, 1970 DOA: 01/09/2014  Referring physician: ED physician.  PCP: Angelica Chessman, MD   Chief Complaint: Abdominal pain.   HPI: [name] is a 43 y.o. male with PMH significant for Pancreatitis thought to be secondary to alcohol, pseudocyst, frequent admission for pancreatitis: 11-26-2013, 12-31-2013, WPW, who presents with recurrent abdominal pain, sharp, stabbing, constant that started day prior to admission. He also report vomiting twice today and day prior of admission. He relates frequent watery stool, 6 episodes today. He was drinking alcohol 4 days prior to admission.  No chest pain, dyspnea, dysuria.    Review of Systems:  Negative, except as per HPI.   Past Medical History  Diagnosis Date  . Hypertension   . Asthma   . Pancreatitis   . Cocaine abuse   . Depression   . H/O suicide attempt   . Heart murmur     "when he was little" (03/06/2013)  . Shortness of breath     "can happen at anytime" (03/06/2013)  . Anemia   . H/O hiatal hernia   . GERD (gastroesophageal reflux disease)   . Arthritis     "knees" (03/06/2013)  . Anxiety   . WPW (Wolff-Parkinson-White syndrome)     [name] 03/06/2013   Past Surgical History  Procedure Laterality Date  . Facial fracture surgery Left 1990's    "result of trauma" (03/06/2013)  . Eye surgery Left 1990's    "result of trauma" (03/06/2013)   Social History:  reports that he has been smoking Cigarettes.  He has a 45 pack-year smoking history. He uses smokeless tobacco. He reports that he drinks alcohol. He reports that he does not use illicit drugs.  Allergies  Allergen Reactions  . Trazodone And Nefazodone Other (See Comments)    Muscle spasms  . Shellfish-Derived Products Nausea And Vomiting    Family History  Problem Relation Age of Onset  . Hypertension Other   . Coronary artery disease Other      Prior to Admission medications    Medication Sig Start Date End Date Taking? Authorizing Provider  acetaminophen (TYLENOL) 500 MG tablet Take 1,000 mg by mouth every 6 (six) hours as needed for mild pain.   Yes Historical Provider, MD  albuterol (PROVENTIL HFA;VENTOLIN HFA) 108 (90 BASE) MCG/ACT inhaler Inhale 1-2 puffs into the lungs every 6 (six) hours as needed for wheezing or shortness of breath. 11/26/13  Yes Shanker Kristeen Mans, MD  Alum & Mag Hydroxide-Simeth (GI COCKTAIL) SUSP suspension Take 30 mLs by mouth 3 (three) times daily as needed for indigestion. Shake well. 11/28/13  Yes [name], MD  feeding supplement, ENSURE COMPLETE, (ENSURE COMPLETE) LIQD Take 237 mLs by mouth 2 (two) times daily between meals. 11/26/13  Yes Shanker Kristeen Mans, MD  folic acid (FOLVITE) 1 MG tablet Take 1 tablet (1 mg total) by mouth daily. 11/26/13  Yes Shanker Kristeen Mans, MD  lipase/protease/amylase (CREON) 12000 UNITS CPEP capsule Take 1 capsule (12,000 Units total) by mouth 3 (three) times daily before meals. 12/31/13  Yes Adeline Saralyn Pilar, MD  metoprolol (LOPRESSOR) 50 MG tablet Take 1 tablet (50 mg total) by mouth 2 (two) times daily. For high blood pressure 11/28/13  Yes [name], MD  Multiple Vitamin (MULTIVITAMIN WITH MINERALS) TABS tablet Take 1 tablet by mouth daily. For low vitamin 11/28/13  Yes Olugbemiga E Doreene Burke, MD  ondansetron (ZOFRAN) 4 MG tablet Take 4 mg by mouth  every 6 (six) hours as needed for nausea or vomiting.   Yes Historical Provider, MD  oxyCODONE (OXY IR/ROXICODONE) 5 MG immediate release tablet Take 1-2 tablets (5-10 mg total) by mouth every 6 (six) hours as needed for severe pain. 12/31/13  Yes Adeline Saralyn Pilar, MD  pantoprazole (PROTONIX) 40 MG tablet Take 1 tablet (40 mg total) by mouth 2 (two) times daily. 11/28/13  Yes [name], MD  QUEtiapine (SEROQUEL) 100 MG tablet Take 100 mg by mouth at bedtime. For mood control 11/28/13  Yes [name], MD  sertraline (ZOLOFT) 25 MG tablet  Take 3 tablets (75 mg total) by mouth daily. For depression 11/28/13  Yes Olugbemiga Essie Christine, MD  thiamine 100 MG tablet Take 1 tablet (100 mg total) by mouth daily. 11/28/13  Yes [name], MD   Physical Exam: Filed Vitals:   01/09/14 1215 01/09/14 1245 01/09/14 1246 01/09/14 1415  BP: 155/96 154/101  145/103  Pulse: 75 70  78  Temp:   98.1 F (36.7 C) 98.8 F (37.1 C)  TempSrc:   Oral Oral  Resp: 17 16  16   Height:    5\' 9"  (1.753 m)  Weight:    63.504 kg (140 lb)  SpO2: 100% 100%  100%    Wt Readings from Last 3 Encounters:  01/09/14 63.504 kg (140 lb)  01/06/14 63.504 kg (140 lb)  12/26/13 58.2 kg (128 lb 4.9 oz)    General:  Appears calm and comfortable Eyes: PERRL, normal lids, irises & conjunctiva ENT: grossly normal hearing, lips & tongue Neck: no LAD, masses or thyromegaly Cardiovascular: RRR, no m/r/g. No LE edema. Telemetry: SR, no arrhythmias  Respiratory: CTA bilaterally, no w/r/r. Normal respiratory effort. Abdomen: soft, tenderness generalized, no rigidity Skin: no rash or induration seen on limited exam Musculoskeletal: grossly normal tone BUE/BLE Psychiatric: grossly normal mood and affect, speech fluent and appropriate Neurologic: grossly non-focal.          Labs on Admission:  Basic Metabolic Panel:  Recent Labs Lab 01/06/14 0512 01/09/14 0611  NA 143 140  K 4.8 4.1  CL 101 98  CO2 24 22  GLUCOSE 97 101*  BUN 5* 5*  CREATININE 0.50 0.56  CALCIUM 9.1 9.1   Liver Function Tests:  Recent Labs Lab 01/06/14 0512 01/09/14 0611  AST 143* 82*  ALT 72* 56*  ALKPHOS 128* 135*  BILITOT 0.5 0.5  PROT 8.2 8.3  ALBUMIN 3.7 3.9    Recent Labs Lab 01/06/14 0512 01/09/14 0611  LIPASE 115* 94*   No results found for this basename: AMMONIA,  in the last 168 hours CBC:  Recent Labs Lab 01/06/14 0512 01/09/14 0611  WBC 7.0 7.3  NEUTROABS 3.1 4.1  HGB 12.3* 12.3*  HCT 35.3* 35.0*  MCV 91.2 88.8  PLT 275 217   Cardiac  Enzymes: No results found for this basename: CKTOTAL, CKMB, CKMBINDEX, TROPONINI,  in the last 168 hours  BNP (last 3 results) No results found for this basename: PROBNP,  in the last 8760 hours CBG: No results found for this basename: GLUCAP,  in the last 168 hours  Radiological Exams on Admission: Ct Abdomen Pelvis W Contrast  01/09/2014   CLINICAL DATA:  Pancreatitis with pseudocyst.  Abdominal pain  EXAM: CT ABDOMEN AND PELVIS WITH CONTRAST  TECHNIQUE: Multidetector CT imaging of the abdomen and pelvis was performed using the standard protocol following bolus administration of intravenous contrast.  CONTRAST:  46mL OMNIPAQUE IOHEXOL 300 MG/ML  SOLN  COMPARISON:  CT abdomen 12/26/2013  FINDINGS: Lung bases are clear.  Fatty infiltration of the liver without focal liver lesion. Gallbladder is elongated and distended but not thickened. Common bile duct nondilated. Pancreas is small.  Changes of pancreatitis have improved. There is less peripancreatic edema and pancreatic swelling compared with the prior study. 23 x 12 mm cyst in the uncinate process similar to the prior study compatible with a pseudocyst.  Negative for bowel obstruction. Scattered air-fluid levels may represent mild ileus. No ascites. Normal appendix.  Negative for mass or adenopathy. Prostate mildly enlarged. Mild bladder wall thickening unchanged.  No acute bony change. Disc degeneration in the lumbar spine especially L5-S1.  IMPRESSION: Interval improvement in pancreatic edema and changes of pancreatitis. 23 x 12 mm pseudocyst in the uncinate process similar to the prior study. No ascites.  Fatty liver   Electronically Signed   By: Franchot Gallo M.D.   On: 01/09/2014 13:43    EKG: Independently reviewed. Sinus tachycardia.   Assessment/Plan Active Problems:   HYPERTENSION   LIVER FUNCTION TESTS, ABNORMAL, HX OF   [name] Parkinson White pattern seen on electrocardiogram   Alcohol dependence   Pancreatic pseudocyst/cyst    Pancreatitis   Pancreatitis, acute   1-Acute Pancreatitis: Presents with abdominal pain, vomiting, mild increase lipase. Had MR abdomen July 2015 that showed pseudocyst, no gallstone or biliary duct dilation. Repeat Ct abdomen with stable pseudocyst.  -Continue to drink alcohol. Counseling was provided.  -NPO, IV fluids, IV pain medications. IV protonix.   2-Diarrhea: probably related to pancreatitis. Resume pancreatic enzymes when tolerates diet. Will check for C diff.   3-Alcohol use; Thiamine, folate. Monitor on CIWA protocol.   4-History of WPW: continue with metoprolol.  5-Depression: continue with currents medications.   6-Transaminases: probably related to alcohol use. Prior MRI abdomen had Normal Gallbladder and no ductal dilation.   Code Status: presume full code.  DVT Prophylaxis: Lovenox.  Family Communication: care discussed with patient.  Disposition Plan: expect 2 to 3 days inpatient.   Time spent: 75 minutes.   Niel Hummer A [facility] Pager [phone]  **Disclaimer: This note may have been dictated with voice recognition software. Similar sounding words can inadvertently be transcribed and this note may contain transcription errors which may not have been corrected upon publication of note.**

## 2014-01-09 NOTE — ED Notes (Signed)
Gave Pt a Gingerale for Fluid Challenge.

## 2014-01-10 DIAGNOSIS — K863 Pseudocyst of pancreas: Secondary | ICD-10-CM

## 2014-01-10 DIAGNOSIS — K862 Cyst of pancreas: Secondary | ICD-10-CM

## 2014-01-10 DIAGNOSIS — F10988 Alcohol use, unspecified with other alcohol-induced disorder: Secondary | ICD-10-CM

## 2014-01-10 DIAGNOSIS — R109 Unspecified abdominal pain: Secondary | ICD-10-CM

## 2014-01-10 LAB — HEPATIC FUNCTION PANEL
ALT: 36 U/L (ref 0–53)
AST: 36 U/L (ref 0–37)
Albumin: 3.2 g/dL — ABNORMAL LOW (ref 3.5–5.2)
Alkaline Phosphatase: 114 U/L (ref 39–117)
Total Bilirubin: 0.9 mg/dL (ref 0.3–1.2)
Total Protein: 7 g/dL (ref 6.0–8.3)

## 2014-01-10 LAB — CBC
HCT: 34.8 % — ABNORMAL LOW (ref 39.0–52.0)
HEMOGLOBIN: 11.5 g/dL — AB (ref 13.0–17.0)
MCH: 31.1 pg (ref 26.0–34.0)
MCHC: 33 g/dL (ref 30.0–36.0)
MCV: 94.1 fL (ref 78.0–100.0)
Platelets: 213 10*3/uL (ref 150–400)
RBC: 3.7 MIL/uL — AB (ref 4.22–5.81)
RDW: 16.7 % — ABNORMAL HIGH (ref 11.5–15.5)
WBC: 7.3 10*3/uL (ref 4.0–10.5)

## 2014-01-10 LAB — BASIC METABOLIC PANEL
Anion gap: 12 (ref 5–15)
BUN: <3 mg/dL — ABNORMAL LOW (ref 6–23)
CO2: 23 meq/L (ref 19–32)
Calcium: 8.6 mg/dL (ref 8.4–10.5)
Chloride: 106 mEq/L (ref 96–112)
Creatinine, Ser: 0.59 mg/dL (ref 0.50–1.35)
GFR calc Af Amer: >90 mL/min (ref >90)
GFR calc non Af Amer: >90 mL/min (ref >90)
Glucose, Bld: 92 mg/dL (ref 70–99)
POTASSIUM: 4.2 meq/L (ref 3.7–5.3)
SODIUM: 141 meq/L (ref 137–147)

## 2014-01-10 LAB — CLOSTRIDIUM DIFFICILE BY PCR: Toxigenic C. Difficile by PCR: NEGATIVE

## 2014-01-10 LAB — LIPASE, BLOOD: LIPASE: 70 U/L — AB (ref 11–59)

## 2014-01-10 MED ORDER — GI COCKTAIL ~~LOC~~
30.0000 mL | Freq: Three times a day (TID) | ORAL | Status: DC | PRN
  Administered 2014-01-10 – 2014-01-15 (×14): 30 mL via ORAL
  Filled 2014-01-10 (×15): qty 30

## 2014-01-10 MED ORDER — METOCLOPRAMIDE HCL 5 MG PO TABS
2.5000 mg | ORAL_TABLET | Freq: Two times a day (BID) | ORAL | Status: DC
  Administered 2014-01-10 – 2014-01-11 (×3): 2.5 mg via ORAL
  Filled 2014-01-10 (×4): qty 0.5

## 2014-01-10 MED ORDER — FOLIC ACID 1 MG PO TABS
1.0000 mg | ORAL_TABLET | Freq: Every day | ORAL | Status: DC
  Administered 2014-01-11 – 2014-01-15 (×5): 1 mg via ORAL
  Filled 2014-01-10 (×5): qty 1

## 2014-01-10 MED ORDER — VITAMIN B-1 100 MG PO TABS
100.0000 mg | ORAL_TABLET | Freq: Every day | ORAL | Status: DC
  Administered 2014-01-11 – 2014-01-15 (×5): 100 mg via ORAL
  Filled 2014-01-10 (×5): qty 1

## 2014-01-10 MED ORDER — PANTOPRAZOLE SODIUM 40 MG PO TBEC
40.0000 mg | DELAYED_RELEASE_TABLET | Freq: Two times a day (BID) | ORAL | Status: DC
  Administered 2014-01-10 – 2014-01-15 (×10): 40 mg via ORAL
  Filled 2014-01-10 (×8): qty 1

## 2014-01-10 NOTE — Progress Notes (Signed)
[facility] PROGRESS NOTE  [name] HFW:[id number] DOB: 14-Jun-1970 DOA: 01/09/2014 PCP: Angelica Chessman, MD  Assessment/Plan: 1-Acute Pancreatitis: Presents with abdominal pain, vomiting, mild increase lipase. Had MR abdomen July 2015 that showed pseudocyst, no gallstone or biliary duct dilation. Repeat Ct abdomen with stable pseudocyst.  -Last etoh intake was around 4 days prior to admit.  -pt wanting to advance to clears today.  2-Diarrhea: probably related to pancreatitis. 3-Alcohol use; Thiamine, folate. Monitor on CIWA protocol.  4-History of WPW: continue with metoprolol.  5-Depression: continue with currents medications.  6-Transaminases: probably related to alcohol use. Prior MRI abdomen had Normal Gallbladder and no ductal dilation.   Code Status: Full Family Communication: Pt in room Disposition Plan: Pending tolerance of diet   Consultants:    Procedures:    Antibiotics:    HPI/Subjective: No acute events noted overngiht  Objective: Filed Vitals:   01/09/14 1559 01/09/14 2125 01/10/14 0600 01/10/14 0939  BP: 140/100 149/104 157/94 141/94  Pulse: 78 70 60 68  Temp:  97.8 F (36.6 C) 97.8 F (36.6 C)   TempSrc:  Oral Oral   Resp:  16 16   Height:      Weight:      SpO2:  100% 100%    No intake or output data in the 24 hours ending 01/10/14 1047 Filed Weights   01/09/14 0521 01/09/14 1415  Weight: 63.504 kg (140 lb) 63.504 kg (140 lb)    Exam:  General:  Awake, in nad, ambulatory  Cardiovascular: regular, s1, s2  Respiratory: normal resp effort, no wheezing  Abdomen: soft, nondistended  Musculoskeletal: perfused, no clubbing   Data Reviewed: Basic Metabolic Panel:  Recent Labs Lab 01/06/14 0512 01/09/14 0611 01/10/14 0615  NA 143 140 141  K 4.8 4.1 4.2  CL 101 98 106  CO2 24 22 23   GLUCOSE 97 101* 92  BUN 5* 5* <3*  CREATININE 0.50 0.56 0.59  CALCIUM 9.1 9.1 8.6   Liver Function Tests:  Recent Labs Lab  01/06/14 0512 01/09/14 0611 01/10/14 0615  AST 143* 82* 36  ALT 72* 56* 36  ALKPHOS 128* 135* 114  BILITOT 0.5 0.5 0.9  PROT 8.2 8.3 7.0  ALBUMIN 3.7 3.9 3.2*    Recent Labs Lab 01/06/14 0512 01/09/14 0611 01/10/14 0615  LIPASE 115* 94* 70*   No results found for this basename: AMMONIA,  in the last 168 hours CBC:  Recent Labs Lab 01/06/14 0512 01/09/14 0611 01/10/14 0615  WBC 7.0 7.3 7.3  NEUTROABS 3.1 4.1  --   HGB 12.3* 12.3* 11.5*  HCT 35.3* 35.0* 34.8*  MCV 91.2 88.8 94.1  PLT 275 217 213   Cardiac Enzymes: No results found for this basename: CKTOTAL, CKMB, CKMBINDEX, TROPONINI,  in the last 168 hours BNP (last 3 results) No results found for this basename: PROBNP,  in the last 8760 hours CBG: No results found for this basename: GLUCAP,  in the last 168 hours  Recent Results (from the past 240 hour(s))  CLOSTRIDIUM DIFFICILE BY PCR     Status: None   Collection Time    01/09/14  4:59 PM      Result Value Ref Range Status   C difficile by pcr NEGATIVE  NEGATIVE Final     Studies: Ct Abdomen Pelvis W Contrast  01/09/2014   CLINICAL DATA:  Pancreatitis with pseudocyst.  Abdominal pain  EXAM: CT ABDOMEN AND PELVIS WITH CONTRAST  TECHNIQUE: Multidetector CT imaging of the abdomen and pelvis  was performed using the standard protocol following bolus administration of intravenous contrast.  CONTRAST:  20mL OMNIPAQUE IOHEXOL 300 MG/ML  SOLN  COMPARISON:  CT abdomen 12/26/2013  FINDINGS: Lung bases are clear.  Fatty infiltration of the liver without focal liver lesion. Gallbladder is elongated and distended but not thickened. Common bile duct nondilated. Pancreas is small.  Changes of pancreatitis have improved. There is less peripancreatic edema and pancreatic swelling compared with the prior study. 23 x 12 mm cyst in the uncinate process similar to the prior study compatible with a pseudocyst.  Negative for bowel obstruction. Scattered air-fluid levels may represent  mild ileus. No ascites. Normal appendix.  Negative for mass or adenopathy. Prostate mildly enlarged. Mild bladder wall thickening unchanged.  No acute bony change. Disc degeneration in the lumbar spine especially L5-S1.  IMPRESSION: Interval improvement in pancreatic edema and changes of pancreatitis. 23 x 12 mm pseudocyst in the uncinate process similar to the prior study. No ascites.  Fatty liver   Electronically Signed   By: Franchot Gallo M.D.   On: 01/09/2014 13:43    Scheduled Meds: . enoxaparin (LOVENOX) injection  40 mg Subcutaneous Q24H  . [START ON 09/15/9355] folic acid  1 mg Oral Daily  . lipase/protease/amylase  12,000 Units Oral TID AC  . metoCLOPramide  2.5 mg Oral BID AC  . metoprolol  50 mg Oral BID  . pantoprazole  40 mg Oral BID  . QUEtiapine  100 mg Oral QHS  . sertraline  75 mg Oral Daily  . sodium chloride  3 mL Intravenous Q12H  . [START ON 01/11/2014] thiamine  100 mg Oral Daily   Continuous Infusions: . dextrose 5 % and 0.9% NaCl 100 mL/hr at 01/10/14 0119    Active Problems:   HYPERTENSION   LIVER FUNCTION TESTS, ABNORMAL, HX OF   [name] Parkinson White pattern seen on electrocardiogram   Alcohol dependence   Pancreatic pseudocyst/cyst   Pancreatitis   Pancreatitis, acute  Time spent: 48min  Caroly Purewal, [facility] Pager [phone]. If 7PM-7AM, please contact night-coverage at [email or web address], password [facility] 01/10/2014, 10:47 AM  LOS: 1 day

## 2014-01-11 DIAGNOSIS — I10 Essential (primary) hypertension: Secondary | ICD-10-CM

## 2014-01-11 LAB — LIPASE, BLOOD: LIPASE: 56 U/L (ref 11–59)

## 2014-01-11 LAB — BASIC METABOLIC PANEL
Anion gap: 14 (ref 5–15)
CHLORIDE: 104 meq/L (ref 96–112)
CO2: 20 meq/L (ref 19–32)
Calcium: 8.6 mg/dL (ref 8.4–10.5)
Creatinine, Ser: 0.52 mg/dL (ref 0.50–1.35)
GFR calc Af Amer: >90 mL/min (ref >90)
Glucose, Bld: 73 mg/dL (ref 70–99)
POTASSIUM: 4.4 meq/L (ref 3.7–5.3)
SODIUM: 138 meq/L (ref 137–147)

## 2014-01-11 MED ORDER — SODIUM CHLORIDE 0.9 % IV SOLN
INTRAVENOUS | Status: DC
  Administered 2014-01-11 – 2014-01-15 (×8): via INTRAVENOUS

## 2014-01-11 MED ORDER — KETOROLAC TROMETHAMINE 10 MG PO TABS
10.0000 mg | ORAL_TABLET | Freq: Four times a day (QID) | ORAL | Status: DC | PRN
  Administered 2014-01-13 – 2014-01-15 (×3): 10 mg via ORAL
  Filled 2014-01-11 (×4): qty 1

## 2014-01-11 MED ORDER — HYDROMORPHONE HCL PF 1 MG/ML IJ SOLN
1.0000 mg | INTRAMUSCULAR | Status: DC | PRN
  Administered 2014-01-11 – 2014-01-15 (×30): 1 mg via INTRAVENOUS
  Filled 2014-01-11 (×30): qty 1

## 2014-01-11 MED ORDER — METOCLOPRAMIDE HCL 5 MG PO TABS
2.5000 mg | ORAL_TABLET | Freq: Three times a day (TID) | ORAL | Status: DC
  Administered 2014-01-11 – 2014-01-15 (×11): 2.5 mg via ORAL
  Filled 2014-01-11 (×15): qty 0.5

## 2014-01-11 MED ORDER — DIPHENHYDRAMINE HCL 25 MG PO CAPS
25.0000 mg | ORAL_CAPSULE | Freq: Every evening | ORAL | Status: DC | PRN
  Administered 2014-01-11 – 2014-01-14 (×4): 25 mg via ORAL
  Filled 2014-01-11 (×4): qty 1

## 2014-01-11 NOTE — Progress Notes (Signed)
[facility] PROGRESS NOTE  [name] PNT:[id number] DOB: 01-30-1971 DOA: 01/09/2014 PCP: Angelica Chessman, MD  Assessment/Plan: 1-Acute Pancreatitis: Presents with abdominal pain, vomiting, mild increase lipase. Had MR abdomen July 2015 that showed pseudocyst, no gallstone or biliary duct dilation. Repeat Ct abdomen with stable pseudocyst.  -Last etoh intake was around 4 days prior to admit.  -Pt wanting to advance to regular diet today, tolerated full liquid diet -Reports improvement with low dose reglan. Will increase to q8hrs 2-Diarrhea: probably related to chronic pancreatitis. 3-Alcohol use; Thiamine, folate. Monitor on CIWA protocol.  4-History of WPW: continue with metoprolol.  5-Depression: continue with currents medications.  6-Transaminases: probably related to alcohol use. Prior MRI abdomen had Normal Gallbladder and no ductal dilation.   Code Status: Full Family Communication: Pt in room Disposition Plan: Home pending tolerance of diet   Consultants:    Procedures:    Antibiotics:    HPI/Subjective: Pt still complaining of mild abd discomfort, overall improving with ambulation and reglan  Objective: Filed Vitals:   01/10/14 0939 01/10/14 1356 01/10/14 2056 01/11/14 0514  BP: 141/94 155/93 153/96 114/77  Pulse: 68 69 87 75  Temp:  97.3 F (36.3 C) 98 F (36.7 C) 98 F (36.7 C)  TempSrc:  Oral Oral Oral  Resp:  16 16 16   Height:      Weight:      SpO2:  100% 100% 98%    Intake/Output Summary (Last 24 hours) at 01/11/14 1252 Last data filed at 01/10/14 1922  Gross per 24 hour  Intake 3163.33 ml  Output      0 ml  Net 3163.33 ml   Filed Weights   01/09/14 0521 01/09/14 1415  Weight: 63.504 kg (140 lb) 63.504 kg (140 lb)    Exam:  General:  Awake, in nad, ambulatory  Cardiovascular: regular, s1, s2  Respiratory: normal resp effort, no wheezing  Abdomen: soft, nondistended  Musculoskeletal: perfused, no clubbing   Data  Reviewed: Basic Metabolic Panel:  Recent Labs Lab 01/06/14 0512 01/09/14 0611 01/10/14 0615 01/11/14 0831  NA 143 140 141 138  K 4.8 4.1 4.2 4.4  CL 101 98 106 104  CO2 24 22 23 20   GLUCOSE 97 101* 92 73  BUN 5* 5* <3* <3*  CREATININE 0.50 0.56 0.59 0.52  CALCIUM 9.1 9.1 8.6 8.6   Liver Function Tests:  Recent Labs Lab 01/06/14 0512 01/09/14 0611 01/10/14 0615  AST 143* 82* 36  ALT 72* 56* 36  ALKPHOS 128* 135* 114  BILITOT 0.5 0.5 0.9  PROT 8.2 8.3 7.0  ALBUMIN 3.7 3.9 3.2*    Recent Labs Lab 01/06/14 0512 01/09/14 0611 01/10/14 0615 01/11/14 0831  LIPASE 115* 94* 70* 56   No results found for this basename: AMMONIA,  in the last 168 hours CBC:  Recent Labs Lab 01/06/14 0512 01/09/14 0611 01/10/14 0615  WBC 7.0 7.3 7.3  NEUTROABS 3.1 4.1  --   HGB 12.3* 12.3* 11.5*  HCT 35.3* 35.0* 34.8*  MCV 91.2 88.8 94.1  PLT 275 217 213   Cardiac Enzymes: No results found for this basename: CKTOTAL, CKMB, CKMBINDEX, TROPONINI,  in the last 168 hours BNP (last 3 results) No results found for this basename: PROBNP,  in the last 8760 hours CBG: No results found for this basename: GLUCAP,  in the last 168 hours  Recent Results (from the past 240 hour(s))  CLOSTRIDIUM DIFFICILE BY PCR     Status: None   Collection Time  01/09/14  4:59 PM      Result Value Ref Range Status   C difficile by pcr NEGATIVE  NEGATIVE Final     Studies: Ct Abdomen Pelvis W Contrast  01/09/2014   CLINICAL DATA:  Pancreatitis with pseudocyst.  Abdominal pain  EXAM: CT ABDOMEN AND PELVIS WITH CONTRAST  TECHNIQUE: Multidetector CT imaging of the abdomen and pelvis was performed using the standard protocol following bolus administration of intravenous contrast.  CONTRAST:  68mL OMNIPAQUE IOHEXOL 300 MG/ML  SOLN  COMPARISON:  CT abdomen 12/26/2013  FINDINGS: Lung bases are clear.  Fatty infiltration of the liver without focal liver lesion. Gallbladder is elongated and distended but not  thickened. Common bile duct nondilated. Pancreas is small.  Changes of pancreatitis have improved. There is less peripancreatic edema and pancreatic swelling compared with the prior study. 23 x 12 mm cyst in the uncinate process similar to the prior study compatible with a pseudocyst.  Negative for bowel obstruction. Scattered air-fluid levels may represent mild ileus. No ascites. Normal appendix.  Negative for mass or adenopathy. Prostate mildly enlarged. Mild bladder wall thickening unchanged.  No acute bony change. Disc degeneration in the lumbar spine especially L5-S1.  IMPRESSION: Interval improvement in pancreatic edema and changes of pancreatitis. 23 x 12 mm pseudocyst in the uncinate process similar to the prior study. No ascites.  Fatty liver   Electronically Signed   By: Franchot Gallo M.D.   On: 01/09/2014 13:43    Scheduled Meds: . enoxaparin (LOVENOX) injection  40 mg Subcutaneous Q24H  . folic acid  1 mg Oral Daily  . lipase/protease/amylase  12,000 Units Oral TID AC  . metoCLOPramide  2.5 mg Oral BID AC  . metoprolol  50 mg Oral BID  . pantoprazole  40 mg Oral BID  . QUEtiapine  100 mg Oral QHS  . sertraline  75 mg Oral Daily  . sodium chloride  3 mL Intravenous Q12H  . thiamine  100 mg Oral Daily   Continuous Infusions: . dextrose 5 % and 0.9% NaCl 100 mL/hr at 01/11/14 0630    Active Problems:   HYPERTENSION   LIVER FUNCTION TESTS, ABNORMAL, HX OF   ABDOMINAL PAIN   [name] Parkinson White pattern seen on electrocardiogram   Alcohol dependence   Pancreatic pseudocyst/cyst   Pancreatitis   Pancreatitis, acute  Time spent: 73min  CHIU, [facility] Pager [phone]. If 7PM-7AM, please contact night-coverage at [email or web address], password [facility] 01/11/2014, 12:52 PM  LOS: 2 days

## 2014-01-11 NOTE — Progress Notes (Signed)
Pt has been calling kitchen for 2nd meal trays today and consuming them.

## 2014-01-12 DIAGNOSIS — K861 Other chronic pancreatitis: Secondary | ICD-10-CM

## 2014-01-12 LAB — BASIC METABOLIC PANEL
ANION GAP: 9 (ref 5–15)
BUN: 6 mg/dL (ref 6–23)
CALCIUM: 8.8 mg/dL (ref 8.4–10.5)
CHLORIDE: 107 meq/L (ref 96–112)
CO2: 23 meq/L (ref 19–32)
Creatinine, Ser: 0.53 mg/dL (ref 0.50–1.35)
GFR calc Af Amer: >90 mL/min (ref >90)
GFR calc non Af Amer: >90 mL/min (ref >90)
GLUCOSE: 82 mg/dL (ref 70–99)
POTASSIUM: 4.7 meq/L (ref 3.7–5.3)
SODIUM: 139 meq/L (ref 137–147)

## 2014-01-12 LAB — LIPASE, BLOOD: LIPASE: 91 U/L — AB (ref 11–59)

## 2014-01-12 NOTE — Progress Notes (Signed)
[facility] PROGRESS NOTE  [name] GHW:[id number] DOB: 08/03/1970 DOA: 01/09/2014 PCP: Jason Chessman, MD  Assessment/Plan: 1-Acute Pancreatitis: Presents with abdominal pain, vomiting, mild increase lipase. Had MR abdomen July 2015 that showed pseudocyst, no gallstone or biliary duct dilation. Repeat Ct abdomen with stable pseudocyst.  -Last etoh intake was 4 days prior to admit.  -Pt wanting to advance to regular diet 9/5, however started having worsened abd pains, now NPO -Reports improvement with low dose reglan. Will increase to q8hrs -Repeat lipase overnight increasing - cont NPO 2-Diarrhea: probably related to chronic pancreatitis. 3-Alcohol use; Thiamine, folate. Monitor on CIWA protocol.  4-History of WPW: continue with metoprolol.  5-Depression: continue with currents medications.  6-Transaminases: probably related to alcohol use. Prior MRI abdomen had Normal Gallbladder and no ductal dilation.   Code Status: Full Family Communication: Pt in room Disposition Plan: Home pending tolerance of diet   Consultants:    Procedures:    Antibiotics:    HPI/Subjective: Complains of epigastric pain worse with food. No acute events noted.  Objective: Filed Vitals:   01/11/14 2143 01/12/14 0035 01/12/14 0420 01/12/14 0819  BP: 145/91 100/74 117/73 114/73  Pulse: 93 100 81 75  Temp: 98.2 F (36.8 C) 98.1 F (36.7 C) 98 F (36.7 C) 97.4 F (36.3 C)  TempSrc: Oral Oral Oral Oral  Resp: 18 20 16 16   Height:      Weight:      SpO2: 100% 100% 100% 100%    Intake/Output Summary (Last 24 hours) at 01/12/14 1215 Last data filed at 01/12/14 0415  Gross per 24 hour  Intake    930 ml  Output    800 ml  Net    130 ml   Filed Weights   01/09/14 0521 01/09/14 1415  Weight: 63.504 kg (140 lb) 63.504 kg (140 lb)    Exam:  General:  Awake, in nad, ambulatory  Cardiovascular: regular, s1, s2  Respiratory: normal resp effort, no wheezing  Abdomen:  soft, nondistended  Musculoskeletal: perfused, no clubbing   Data Reviewed: Basic Metabolic Panel:  Recent Labs Lab 01/06/14 0512 01/09/14 0611 01/10/14 0615 01/11/14 0831 01/12/14 0511  NA 143 140 141 138 139  K 4.8 4.1 4.2 4.4 4.7  CL 101 98 106 104 107  CO2 24 22 23 20 23   GLUCOSE 97 101* 92 73 82  BUN 5* 5* <3* <3* 6  CREATININE 0.50 0.56 0.59 0.52 0.53  CALCIUM 9.1 9.1 8.6 8.6 8.8   Liver Function Tests:  Recent Labs Lab 01/06/14 0512 01/09/14 0611 01/10/14 0615  AST 143* 82* 36  ALT 72* 56* 36  ALKPHOS 128* 135* 114  BILITOT 0.5 0.5 0.9  PROT 8.2 8.3 7.0  ALBUMIN 3.7 3.9 3.2*    Recent Labs Lab 01/06/14 0512 01/09/14 0611 01/10/14 0615 01/11/14 0831 01/12/14 0511  LIPASE 115* 94* 70* 56 91*   No results found for this basename: AMMONIA,  in the last 168 hours CBC:  Recent Labs Lab 01/06/14 0512 01/09/14 0611 01/10/14 0615  WBC 7.0 7.3 7.3  NEUTROABS 3.1 4.1  --   HGB 12.3* 12.3* 11.5*  HCT 35.3* 35.0* 34.8*  MCV 91.2 88.8 94.1  PLT 275 217 213   Cardiac Enzymes: No results found for this basename: CKTOTAL, CKMB, CKMBINDEX, TROPONINI,  in the last 168 hours BNP (last 3 results) No results found for this basename: PROBNP,  in the last 8760 hours CBG: No results found for this basename: GLUCAP,  in  the last 168 hours  Recent Results (from the past 240 hour(s))  CLOSTRIDIUM DIFFICILE BY PCR     Status: None   Collection Time    01/09/14  4:59 PM      Result Value Ref Range Status   C difficile by pcr NEGATIVE  NEGATIVE Final     Studies: No results found.  Scheduled Meds: . enoxaparin (LOVENOX) injection  40 mg Subcutaneous Q24H  . folic acid  1 mg Oral Daily  . lipase/protease/amylase  12,000 Units Oral TID AC  . metoCLOPramide  2.5 mg Oral TID AC  . metoprolol  50 mg Oral BID  . pantoprazole  40 mg Oral BID  . QUEtiapine  100 mg Oral QHS  . sertraline  75 mg Oral Daily  . sodium chloride  3 mL Intravenous Q12H  . thiamine   100 mg Oral Daily   Continuous Infusions: . sodium chloride 75 mL/hr at 01/12/14 0415  . dextrose 5 % and 0.9% NaCl 100 mL/hr at 01/11/14 0630    Active Problems:   HYPERTENSION   LIVER FUNCTION TESTS, ABNORMAL, HX OF   ABDOMINAL PAIN   [name] Parkinson White pattern seen on electrocardiogram   Alcohol dependence   Pancreatic pseudocyst/cyst   Pancreatitis   Pancreatitis, acute  Time spent: 24min  [name], [name] Pager [phone]. If 7PM-7AM, please contact night-coverage at [email or web address], password [facility] 01/12/2014, 12:15 PM  LOS: 3 days

## 2014-01-13 LAB — LIPASE, BLOOD: LIPASE: 55 U/L (ref 11–59)

## 2014-01-13 NOTE — Progress Notes (Addendum)
CARE MANAGEMENT NOTE 01/13/2014  Patient:  [name], [name]   Account Number:  [id number]  Date Initiated:  01/13/2014  Documentation initiated by:  [facility]  Subjective/Objective Assessment:   Acute Pancreatitis     Action/Plan:   Anticipated DC Date:  01/14/2014   Anticipated DC Plan:  [place]  CM consult  Medication Assistance      Choice offered to / List presented to:             Status of service:  In process, will continue to follow Medicare Important Message given?   (If response is "NO", the following Medicare IM given date fields will be blank) Date Medicare IM given:   Medicare IM given by:   Date Additional Medicare IM given:   Additional Medicare IM given by:    Discharge Disposition:    Per UR Regulation:    If discussed at Long Length of Stay Meetings, dates discussed:    Comments:  01/13/2014 1520 NCM spoke to pt and states he has applied for disability. Pt states he is not working. States he is able to afford his HTN meds, and asthma meds. States Creon runs $200 for a 30 day supply. Printed pt an application for Creon patient assistance application to receive medication free if approved by PAP program. Pt will need a new appt at [facility]. States he goes to [facility] and his appt was scheduled for the day he was admitted. Will reschedule appt on 01/14/2014. Jonnie Finner RN CCM Case Mgmt phone [phone]

## 2014-01-13 NOTE — Progress Notes (Signed)
[facility] PROGRESS NOTE  [name] [id number]:[id number] DOB: [birth year] DOA: 01/09/2014 PCP: Angelica Chessman, MD  Assessment/Plan: 1-Acute Pancreatitis: Presents with abdominal pain, vomiting, mild increase lipase. Had MR abdomen July 2015 that showed pseudocyst, no gallstone or biliary duct dilation. Repeat Ct abdomen with stable pseudocyst.  -Last etoh intake was 4 days prior to admit.  -Pt wanting to advance to regular diet 9/5, however started having worsened abd pains, resumed NPO status -Reports improvement with low dose reglan. -Lipase normal, however, pt continues to have abd pain 2-Diarrhea: probably related to chronic pancreatitis. 3-Alcohol use; Thiamine, folate. Monitor on CIWA protocol.  4-History of WPW: continue with metoprolol.  5-Depression: continue with currents medications.  6-Transaminases: suspected secondary to alcohol use. Prior MRI abdomen had Normal Gallbladder and no ductal dilation.   Code Status: Full Family Communication: Pt in room Disposition Plan: Home pending tolerance of diet   Consultants:    Procedures:    Antibiotics:    HPI/Subjective: Continues with abd pain. No acute events noted overnight  Objective: Filed Vitals:   01/12/14 2250 01/13/14 0003 01/13/14 0356 01/13/14 1409  BP: 130/86 109/68 102/58 116/83  Pulse: 89 77 77 85  Temp:  98.2 F (36.8 C) 97.9 F (36.6 C) 98.5 F (36.9 C)  TempSrc:  Oral Oral Oral  Resp:  18 18 16   Height:      Weight:      SpO2:  100% 99% 100%    Intake/Output Summary (Last 24 hours) at 01/13/14 1524 Last data filed at 01/13/14 0600  Gross per 24 hour  Intake    900 ml  Output    820 ml  Net     80 ml   Filed Weights   01/09/14 0521 01/09/14 1415  Weight: 63.504 kg (140 lb) 63.504 kg (140 lb)    Exam:  General:  Awake, in nad, ambulatory  Cardiovascular: regular, s1, s2  Respiratory: normal resp effort, no wheezing  Abdomen: soft, nondistended  Musculoskeletal:  perfused, no clubbing   Data Reviewed: Basic Metabolic Panel:  Recent Labs Lab 01/09/14 0611 01/10/14 0615 01/11/14 0831 01/12/14 0511  NA 140 141 138 139  K 4.1 4.2 4.4 4.7  CL 98 106 104 107  CO2 22 23 20 23   GLUCOSE 101* 92 73 82  BUN 5* <3* <3* 6  CREATININE 0.56 0.59 0.52 0.53  CALCIUM 9.1 8.6 8.6 8.8   Liver Function Tests:  Recent Labs Lab 01/09/14 0611 01/10/14 0615  AST 82* 36  ALT 56* 36  ALKPHOS 135* 114  BILITOT 0.5 0.9  PROT 8.3 7.0  ALBUMIN 3.9 3.2*    Recent Labs Lab 01/09/14 0611 01/10/14 0615 01/11/14 0831 01/12/14 0511 01/13/14 0447  LIPASE 94* 70* 56 91* 55   No results found for this basename: AMMONIA,  in the last 168 hours CBC:  Recent Labs Lab 01/09/14 0611 01/10/14 0615  WBC 7.3 7.3  NEUTROABS 4.1  --   HGB 12.3* 11.5*  HCT 35.0* 34.8*  MCV 88.8 94.1  PLT 217 213   Cardiac Enzymes: No results found for this basename: CKTOTAL, CKMB, CKMBINDEX, TROPONINI,  in the last 168 hours BNP (last 3 results) No results found for this basename: PROBNP,  in the last 8760 hours CBG: No results found for this basename: GLUCAP,  in the last 168 hours  Recent Results (from the past 240 hour(s))  CLOSTRIDIUM DIFFICILE BY PCR     Status: None   Collection Time    01/09/14  4:59 PM      Result Value Ref Range Status   C difficile by pcr NEGATIVE  NEGATIVE Final     Studies: No results found.  Scheduled Meds: . enoxaparin (LOVENOX) injection  40 mg Subcutaneous Q24H  . folic acid  1 mg Oral Daily  . lipase/protease/amylase  12,000 Units Oral TID AC  . metoCLOPramide  2.5 mg Oral TID AC  . metoprolol  50 mg Oral BID  . pantoprazole  40 mg Oral BID  . QUEtiapine  100 mg Oral QHS  . sertraline  75 mg Oral Daily  . sodium chloride  3 mL Intravenous Q12H  . thiamine  100 mg Oral Daily   Continuous Infusions: . sodium chloride 75 mL/hr at 01/13/14 0600  . dextrose 5 % and 0.9% NaCl 100 mL/hr at 01/11/14 0630    Active Problems:    HYPERTENSION   LIVER FUNCTION TESTS, ABNORMAL, HX OF   ABDOMINAL PAIN   [name] Parkinson White pattern seen on electrocardiogram   Alcohol dependence   Pancreatic pseudocyst/cyst   Pancreatitis   Pancreatitis, acute  Time spent: 10min  [name], [facility] Pager [phone]. If 7PM-7AM, please contact night-coverage at [email or web address], password [facility] 01/13/2014, 3:24 PM  LOS: 4 days

## 2014-01-14 LAB — LIPASE, BLOOD: Lipase: 53 U/L (ref 11–59)

## 2014-01-14 NOTE — Progress Notes (Addendum)
[facility] PROGRESS NOTE  [name] EUM:[id number] DOB: 02-Jun-1970 DOA: 01/09/2014 PCP: Angelica Chessman, MD  Off Service Summary: Pt presents with recurrent acute on chronic pancreatitis. Pt has noted gradual improvement but still complains of epigastric pain. A trial of clears PO has been started on 9/8. Hopefully pt will tolerate an advanced diet soon.  Assessment/Plan: 1-Acute Pancreatitis: Presents with abdominal pain, vomiting, mild increase lipase. Had MR abdomen July 2015 that showed pseudocyst, no gallstone or biliary duct dilation. Repeat Ct abdomen with stable pseudocyst.  -Last etoh intake was 4 days prior to admit.  -Pt wanting to advance to regular diet 9/5, however started having worsened abd pains, resumed NPO status -Reports improvement with low dose reglan. -Lipase normal, however, pt continues to have abd pain -Will attempt to restart clear liquid diet 9/8, and follow lipase 2-Diarrhea: probably related to chronic pancreatitis. Improving 3-Alcohol use; Thiamine, folate. Monitor on CIWA protocol.  4-History of WPW: continue with metoprolol.  5-Depression: continue with currents medications.  6-Transaminases: suspected secondary to alcohol use. Prior MRI abdomen had Normal Gallbladder and no ductal dilation.   Code Status: Full Family Communication: Pt in room Disposition Plan: Home pending tolerance of diet  Consultants:  none  Procedures:    Antibiotics:    HPI/Subjective: Continues with abd pain. Diarrhea improving with reglan.   Objective: Filed Vitals:   01/13/14 0356 01/13/14 1409 01/13/14 2148 01/14/14 0618  BP: 102/58 116/83 133/85 118/86  Pulse: 77 85 96 72  Temp: 97.9 F (36.6 C) 98.5 F (36.9 C) 98.6 F (37 C) 98.1 F (36.7 C)  TempSrc: Oral Oral Oral Oral  Resp: 18 16 16 16   Height:      Weight:      SpO2: 99% 100% 100% 100%    Intake/Output Summary (Last 24 hours) at 01/14/14 1039 Last data filed at 01/14/14 0925  Gross per 24 hour  Intake    803 ml  Output    650 ml  Net    153 ml   Filed Weights   01/09/14 0521 01/09/14 1415  Weight: 63.504 kg (140 lb) 63.504 kg (140 lb)    Exam:  General:  Awake, in nad, ambulatory  Cardiovascular: regular, s1, s2  Respiratory: normal resp effort, no wheezing  Abdomen: soft, nondistended  Musculoskeletal: perfused, no clubbing   Data Reviewed: Basic Metabolic Panel:  Recent Labs Lab 01/09/14 0611 01/10/14 0615 01/11/14 0831 01/12/14 0511  NA 140 141 138 139  K 4.1 4.2 4.4 4.7  CL 98 106 104 107  CO2 22 23 20 23   GLUCOSE 101* 92 73 82  BUN 5* <3* <3* 6  CREATININE 0.56 0.59 0.52 0.53  CALCIUM 9.1 8.6 8.6 8.8   Liver Function Tests:  Recent Labs Lab 01/09/14 0611 01/10/14 0615  AST 82* 36  ALT 56* 36  ALKPHOS 135* 114  BILITOT 0.5 0.9  PROT 8.3 7.0  ALBUMIN 3.9 3.2*    Recent Labs Lab 01/10/14 0615 01/11/14 0831 01/12/14 0511 01/13/14 0447 01/14/14 0932  LIPASE 70* 56 91* 55 53   No results found for this basename: AMMONIA,  in the last 168 hours CBC:  Recent Labs Lab 01/09/14 0611 01/10/14 0615  WBC 7.3 7.3  NEUTROABS 4.1  --   HGB 12.3* 11.5*  HCT 35.0* 34.8*  MCV 88.8 94.1  PLT 217 213   Cardiac Enzymes: No results found for this basename: CKTOTAL, CKMB, CKMBINDEX, TROPONINI,  in the last 168 hours BNP (last 3 results) No  results found for this basename: PROBNP,  in the last 8760 hours CBG: No results found for this basename: GLUCAP,  in the last 168 hours  Recent Results (from the past 240 hour(s))  CLOSTRIDIUM DIFFICILE BY PCR     Status: None   Collection Time    01/09/14  4:59 PM      Result Value Ref Range Status   C difficile by pcr NEGATIVE  NEGATIVE Final     Studies: No results found.  Scheduled Meds: . enoxaparin (LOVENOX) injection  40 mg Subcutaneous Q24H  . folic acid  1 mg Oral Daily  . lipase/protease/amylase  12,000 Units Oral TID AC  . metoCLOPramide  2.5 mg Oral TID AC  .  metoprolol  50 mg Oral BID  . pantoprazole  40 mg Oral BID  . QUEtiapine  100 mg Oral QHS  . sertraline  75 mg Oral Daily  . sodium chloride  3 mL Intravenous Q12H  . thiamine  100 mg Oral Daily   Continuous Infusions: . sodium chloride 75 mL/hr at 01/14/14 0637  . dextrose 5 % and 0.9% NaCl 100 mL/hr at 01/11/14 0630    Active Problems:   HYPERTENSION   LIVER FUNCTION TESTS, ABNORMAL, HX OF   ABDOMINAL PAIN   [name] Parkinson White pattern seen on electrocardiogram   Alcohol dependence   Pancreatic pseudocyst/cyst   Pancreatitis   Pancreatitis, acute  Time spent: 29min  CH[facility], [facility] Pager [phone]. If 7PM-7AM, please contact night-coverage at [email or web address], password [facility] 01/14/2014, 10:39 AM  LOS: 5 days

## 2014-01-14 NOTE — Progress Notes (Signed)
Responded to page from operator, pt requested to talk with chaplain, and operator connected [place]. PT was experiencing anxiety and wanted to talk about domestic challenges, and also his medical condition. He had theological questions around his significant other's decisions. Chaplain provided empathetic listening, explored with him other relationship alternatives, and emotional and spiritual support. Will follow as needed.    01/14/14 2000  Clinical Encounter Type  Visited With Other (Comment) (Spoke with PT via telephone)  Visit Type Initial;Psychological support;Spiritual support;Social support  Referral From Patient  Spiritual Encounters  Spiritual Needs Emotional  Stress Factors  Patient Stress Factors Exhausted;Family relationships  Jaydan Meidinger, Annamaria Helling, Chaplain

## 2014-01-14 NOTE — Care Management Note (Signed)
    Page 1 of 1   01/15/2014     3:44:20 PM CARE MANAGEMENT NOTE 01/15/2014  Patient:  [name], [name]   Account Number:  [id number]  Date Initiated:  01/13/2014  Documentation initiated by:  [facility]  Subjective/Objective Assessment:   Acute Pancreatitis     Action/Plan:   Anticipated DC Date:  01/15/2014   Anticipated DC Plan:  [place]  CM consult  Medication Assistance      Choice offered to / List presented to:             Status of service:  Completed, signed off Medicare Important Message given?  NO (If response is "NO", the following Medicare IM given date fields will be blank) Date Medicare IM given:   Medicare IM given by:   Date Additional Medicare IM given:   Additional Medicare IM given by:    Discharge Disposition:  HOME/SELF CARE  Per UR Regulation:  Reviewed for med. necessity/level of care/duration of stay  If discussed at [place] of Stay Meetings, dates discussed:   01/14/2014    Comments:  01/15/14 [place], BSN [phone] advancing diet, and patient still c/o pain.  NCM called financial counselor to contact patient about Medicaid.  She will call patient in his room.  Patient states he has 23 days worth of creon left and that he can also get his other medicaitons without a problem.  01/14/14 [place], BSN [phone] NCM assisted patient with Creon on last admit with [employer] [facility], patient states he has about 23 days worth of creon left.   01/13/2014 1520 NCM spoke to pt and states he has applied for disability. Pt states he is not working. States he is able to afford his HTN meds, and asthma meds. States Creon runs $200 for a 30 day supply. Printed pt an application for Creon patient assistance application to receive medication free if approved by PAP program. Pt will need a new appt at [facility]. States he goes to [facility] and his appt was scheduled for the day he was admitted. Will reschedule appt on  01/14/2014. Jonnie Finner RN CCM Case Mgmt phone [phone]

## 2014-01-15 ENCOUNTER — Telehealth: Payer: Self-pay | Admitting: Internal Medicine

## 2014-01-15 DIAGNOSIS — E43 Unspecified severe protein-calorie malnutrition: Secondary | ICD-10-CM

## 2014-01-15 DIAGNOSIS — F3289 Other specified depressive episodes: Secondary | ICD-10-CM

## 2014-01-15 DIAGNOSIS — F329 Major depressive disorder, single episode, unspecified: Secondary | ICD-10-CM

## 2014-01-15 LAB — LIPASE, BLOOD: LIPASE: 66 U/L — AB (ref 11–59)

## 2014-01-15 MED ORDER — QUETIAPINE FUMARATE 100 MG PO TABS: 100.0000 mg | ORAL_TABLET | Freq: Every day | ORAL | Status: DC

## 2014-01-15 MED ORDER — GI COCKTAIL ~~LOC~~: 30.0000 mL | Freq: Three times a day (TID) | ORAL | Status: DC | PRN

## 2014-01-15 MED ORDER — OXYCODONE HCL 5 MG PO TABS: 10.0000 mg | ORAL_TABLET | Freq: Four times a day (QID) | ORAL | Status: DC | PRN

## 2014-01-15 MED ORDER — OXYCODONE HCL 5 MG PO TABS
5.0000 mg | ORAL_TABLET | Freq: Four times a day (QID) | ORAL | Status: DC | PRN
  Administered 2014-01-15: 10 mg via ORAL
  Filled 2014-01-15: qty 2

## 2014-01-15 MED ORDER — ENSURE COMPLETE PO LIQD: 237.0000 mL | Freq: Two times a day (BID) | ORAL | Status: DC

## 2014-01-15 MED ORDER — SACCHAROMYCES BOULARDII 250 MG PO CAPS
250.0000 mg | ORAL_CAPSULE | Freq: Two times a day (BID) | ORAL | Status: DC
  Administered 2014-01-15: 250 mg via ORAL
  Filled 2014-01-15 (×2): qty 1

## 2014-01-15 MED ORDER — PANCRELIPASE (LIP-PROT-AMYL) 12000-38000 UNITS PO CPEP
24000.0000 [IU] | ORAL_CAPSULE | Freq: Three times a day (TID) | ORAL | Status: DC
  Filled 2014-01-15 (×2): qty 2

## 2014-01-15 MED ORDER — PANCRELIPASE (LIP-PROT-AMYL) 12000-38000 UNITS PO CPEP: 24000.0000 [IU] | ORAL_CAPSULE | Freq: Three times a day (TID) | ORAL | Status: DC

## 2014-01-15 NOTE — Progress Notes (Signed)
Re-evaluation-ate sandwich-no vomiting. Pain at baseline-suspect he has chronic abd pain secondary to chronic pancreatitis, ambulating in room. Awaiting arrival of family members, patient is dressed and is now wanting to go home. Will discharge after talking with family members.

## 2014-01-15 NOTE — Progress Notes (Signed)
Asked by RN to see patient at his request-per RN patient vomited the soup he ate earlier this afternoon. Before seeing patient, reviewed the camera footage-where he was observed to drop soup on the floor. I and the 5W Department director-Ms Glosson RN went to talk to patient, this MD asked patient repeatedly whether he vomited-patient initially claimed that he did, however I did inform him that the camera footage did not show him physically vomiting. He then did acknowledge that he did not vomit,and then proceeded to ask for IV Dilaudid. This MD then explained to patient that he should not manufacture such symptoms and that will lead to un-necessary treatment that can lead to harmful side effects. He claimed understanding. I did explain to him that he needs to be truthful with his MD/RN in the future. I have explained to patient that he will continued to be transitioned to oral narcotics, diet will be advanced, and he needs to avoid ETOH use!!Also informed patient that once he tolerates diet, he will be discharged home.

## 2014-01-15 NOTE — Progress Notes (Signed)
Data: Patient reports to vomit and not tolerating full liquid diet. At 1333, RN in room to administer po pain medication. Patient was informed that IV pain medication was discontinued. At 1340, Patient calls out to RN stating, he had vomited his soup. Informed RN, that he did not tolerate the soup, and requesting IV pain medication. Requested to see MD and Departmental Leadership. After review of camera footage, observed patient to drop soup on the floor, no- actions of physical vomiting noted.  Action: MD and Departmental leadership in to see patient. Asked patient to review sequence of events. Patient was disclosed what was observed on camera. He did admit to not vomiting. Patient requesting to have IV pain medication continued. MD had lengthy discussion in reference to diet, pain control, alcohol usage, and current plan of care.  Response: Patient verbalized understanding.

## 2014-01-15 NOTE — Progress Notes (Signed)
Informed by RN-the patient not wanting to leave right away, he claims that his girlfriend and other family members waiting for him downstairs and not are coming up to talk with this M.D. His in the hallway, dressed and packed up. He claims that he currently is "fine", and does not appear to be in any distress. He denies any significant/severe abdominal pain. His last dose of Oxycodone was around 1:30 pm. Will discharge him, patient is aware to call his PCPs office tomorrow to make an appointment. He is also been counseled extensively about the need to completely abstain from alcohol. He will likely need outpatient pain management referral, will management of chronic abdominal pain.

## 2014-01-15 NOTE — Progress Notes (Addendum)
PATIENT DETAILS Name: [name] Age: 43 y.o. Sex: male Date of Birth: 1971/01/20 Admit Date: 01/09/2014 Admitting Physician Elmarie Shiley, MD NTI:[id number], Gabrielle Dare, MD  Subjective: No abdominal pain this am-tolerated clear liquids. Seen ambulating in hallway  Assessment/Plan: Active Problems: Acute on chronic pancreatitis - Secondary to continued alcohol use (claimed to this MD last drink was 2-3 weeks back). - Slow improvement, tolerated clear liquids this morning-advanced to full liquids.Abdominal exam is completely benign.Stop IV Dilaudid, transitioned to oral narcotics. Home once able to tolerate advancement in diet. - CT abdomen done on 9/3 - stable pseudocyst-and improvement from prior CT Scan  Diarrhea - C. difficile PCR negative on 9/3. Suspect related to chronic pancreatitis. Stop Reglan, start probiotics. Continue Creon. Monitor for now.  Alcohol abuse - No signs of withdrawal. - Monitor, Ativan as needed  Alcoholic hepatitis - Liver enzymes significantly improved. - Monitor off of periodic  Tachycardia with history of WPW  -c/w Metoprolol-stable   Severe Malnutrition  -in the context of chronic illness - Will start supplements- once oral intake stabilizes.  History of depression  -c/w Seroquel,Zoloft  -denies any suicidal or homicidal ideation at present  Disposition: Remain inpatient  DVT Prophylaxis: Prophylactic Lovenox   Code Status: Full code  Family Communication None  Procedures:  None  CONSULTS:  None  Time spent 40 minutes-which includes 50% of the time with face-to-face with patient/ family and coordinating care related to the above assessment and plan.    MEDICATIONS: Scheduled Meds: . enoxaparin (LOVENOX) injection  40 mg Subcutaneous Q24H  . folic acid  1 mg Oral Daily  . lipase/protease/amylase  12,000 Units Oral TID AC  . metoCLOPramide  2.5 mg Oral TID AC  . metoprolol  50 mg Oral BID  . pantoprazole   40 mg Oral BID  . QUEtiapine  100 mg Oral QHS  . sertraline  75 mg Oral Daily  . sodium chloride  3 mL Intravenous Q12H  . thiamine  100 mg Oral Daily   Continuous Infusions: . dextrose 5 % and 0.9% NaCl 100 mL/hr at 01/11/14 0630   PRN Meds:.acetaminophen, acetaminophen, albuterol, diphenhydrAMINE, gi cocktail, HYDROmorphone (DILAUDID) injection, ketorolac, ondansetron (ZOFRAN) IV, ondansetron  Antibiotics: Anti-infectives   None       PHYSICAL EXAM: Vital signs in last 24 hours: Filed Vitals:   01/14/14 0618 01/14/14 1414 01/14/14 2158 01/15/14 0628  BP: 118/86 127/85 155/96 119/82  Pulse: 72 66 78 72  Temp: 98.1 F (36.7 C) 98.1 F (36.7 C) 97.9 F (36.6 C) 97.8 F (36.6 C)  TempSrc: Oral Oral Oral Oral  Resp: 16 18 18 16   Height:      Weight:      SpO2: 100% 98% 99% 100%    Weight change:  Filed Weights   01/09/14 0521 01/09/14 1415  Weight: 63.504 kg (140 lb) 63.504 kg (140 lb)   Body mass index is 20.67 kg/(m^2).   Gen Exam: Awake and alert with clear speech.   Neck: Supple, No JVD.   Chest: B/L Clear.   CVS: S1 S2 Regular, no murmurs.  Abdomen: soft, BS +, non tender this am, non distended.  Extremities: no edema, lower extremities warm to touch. Neurologic: Non Focal.   Skin: No Rash.   Wounds: N/A.    Intake/Output from previous day:  Intake/Output Summary (Last 24 hours) at 01/15/14 1228 Last data filed at 01/15/14 0900  Gross per 24 hour  Intake  3825 ml  Output    478 ml  Net   3347 ml     LAB RESULTS: CBC  Recent Labs Lab 01/09/14 0611 01/10/14 0615  WBC 7.3 7.3  HGB 12.3* 11.5*  HCT 35.0* 34.8*  PLT 217 213  MCV 88.8 94.1  MCH 31.2 31.1  MCHC 35.1 33.0  RDW 16.5* 16.7*  LYMPHSABS 2.3  --   MONOABS 0.5  --   EOSABS 0.4  --   BASOSABS 0.0  --     Chemistries   Recent Labs Lab 01/09/14 0611 01/10/14 0615 01/11/14 0831 01/12/14 0511  NA 140 141 138 139  K 4.1 4.2 4.4 4.7  CL 98 106 104 107  CO2 22 23 20 23     GLUCOSE 101* 92 73 82  BUN 5* <3* <3* 6  CREATININE 0.56 0.59 0.52 0.53  CALCIUM 9.1 8.6 8.6 8.8    CBG: No results found for this basename: GLUCAP,  in the last 168 hours  GFR Estimated Creatinine Clearance: 105.8 ml/min (by C-G formula based on Cr of 0.53).  Coagulation profile No results found for this basename: INR, PROTIME,  in the last 168 hours  Cardiac Enzymes No results found for this basename: CK, CKMB, TROPONINI, MYOGLOBIN,  in the last 168 hours  No components found with this basename: POCBNP,  No results found for this basename: DDIMER,  in the last 72 hours No results found for this basename: HGBA1C,  in the last 72 hours No results found for this basename: CHOL, HDL, LDLCALC, TRIG, CHOLHDL, LDLDIRECT,  in the last 72 hours No results found for this basename: TSH, T4TOTAL, FREET3, T3FREE, THYROIDAB,  in the last 72 hours No results found for this basename: VITAMINB12, FOLATE, FERRITIN, TIBC, IRON, RETICCTPCT,  in the last 72 hours  Recent Labs  01/14/14 0932 01/15/14 0629  LIPASE 53 66*    Urine Studies No results found for this basename: UACOL, UAPR, USPG, UPH, UTP, UGL, UKET, UBIL, UHGB, UNIT, UROB, ULEU, UEPI, UWBC, URBC, UBAC, CAST, CRYS, UCOM, BILUA,  in the last 72 hours  MICROBIOLOGY: Recent Results (from the past 240 hour(s))  CLOSTRIDIUM DIFFICILE BY PCR     Status: None   Collection Time    01/09/14  4:59 PM      Result Value Ref Range Status   C difficile by pcr NEGATIVE  NEGATIVE Final    RADIOLOGY STUDIES/RESULTS: Ct Abdomen Pelvis W Contrast  01/09/2014   CLINICAL DATA:  Pancreatitis with pseudocyst.  Abdominal pain  EXAM: CT ABDOMEN AND PELVIS WITH CONTRAST  TECHNIQUE: Multidetector CT imaging of the abdomen and pelvis was performed using the standard protocol following bolus administration of intravenous contrast.  CONTRAST:  70mL OMNIPAQUE IOHEXOL 300 MG/ML  SOLN  COMPARISON:  CT abdomen 12/26/2013  FINDINGS: Lung bases are clear.  Fatty  infiltration of the liver without focal liver lesion. Gallbladder is elongated and distended but not thickened. Common bile duct nondilated. Pancreas is small.  Changes of pancreatitis have improved. There is less peripancreatic edema and pancreatic swelling compared with the prior study. 23 x 12 mm cyst in the uncinate process similar to the prior study compatible with a pseudocyst.  Negative for bowel obstruction. Scattered air-fluid levels may represent mild ileus. No ascites. Normal appendix.  Negative for mass or adenopathy. Prostate mildly enlarged. Mild bladder wall thickening unchanged.  No acute bony change. Disc degeneration in the lumbar spine especially L5-S1.  IMPRESSION: Interval improvement in pancreatic edema and changes of  pancreatitis. 23 x 12 mm pseudocyst in the uncinate process similar to the prior study. No ascites.  Fatty liver   Electronically Signed   By: Franchot Gallo M.D.   On: 01/09/2014 13:43   Ct Abdomen Pelvis W Contrast  12/26/2013   ADDENDUM REPORT: 12/26/2013 20:05  ADDENDUM: Voice recognition error: The third impression should read "Mild thickening in urinary bladder wall raises question of a degree of cystitis". Original dictation by Dr. Jasmine December, addendum by Dr. Leonia Reeves  Findings conveyed toBurke Thompsonon 12/26/2013  at20:04.   Electronically Signed   By: Suzy Bouchard M.D.   On: 12/26/2013 20:05   12/26/2013   CLINICAL DATA:  Acute pancreatitis  EXAM: CT ABDOMEN AND PELVIS WITH CONTRAST  TECHNIQUE: Multidetector CT imaging of the abdomen and pelvis was performed using the standard protocol following bolus administration of intravenous contrast. Oral contrast was also administered.  CONTRAST:  165mL OMNIPAQUE IOHEXOL 300 MG/ML  SOLN  COMPARISON:  August 17, 2012  FINDINGS: Lung bases are clear.  Liver is prominent, measuring 17.9 cm in length. There is hepatic steatosis. No focal liver lesions are identified. There is no biliary duct dilatation. Gallbladder wall is not  thickened.  Spleen and adrenals appear normal.  There is moderate inflammatory change surrounding the pancreas, primarily in the head and body regions. There is edema in the uncinate process of the pancreas. There is an apparent pseudocyst in the uncinate process of the pancreas measuring 2.1 by 0.8 cm. There are scattered areas of focal pancreatic duct dilatation. There is no calcification or non cystic mass in the pancreas. Inflammation is seen abutting the second and third portions of the duodenum adjacent to the pancreas with mild wall thickening in this area consistent with a degree of duodenitis.  Kidneys bilaterally show no evidence of mass or hydronephrosis. There is no renal or ureteral calculus on either side.  In the pelvis, there is mild wall thickening of the urinary bladder. There is no pelvic mass or fluid collection. The appendix appears normal. Terminal ileum appears normal.  There is no appreciable bowel obstruction. No free air or portal venous air.  There is no ascites, adenopathy, or abscess in the abdomen or pelvis. There is atherosclerotic change in the aorta and iliac arteries without aneurysm. There is degenerative change at L5-S1 with vacuum phenomenon at this level. There are no blastic or lytic bone lesions. There is broad-based disc bulging at L4-5 and L5-S1.  IMPRESSION: Evidence of acute pancreatitis with mild peripancreatic fluid an apparent small pseudocyst in the uncinate process region. There is adjacent duodenitis due to the pancreatitis immediately adjacent to the duodenum in the second and third portions.  Appendix appears normal. No bowel obstruction. No abscess. No renal or ureteral calculus. No hydronephrosis.  Mild thickening in urinary bladder wall raises question of a degree of appendicitis.  Liver enlarged with hepatic steatosis.  Electronically Signed: By: Lowella Grip M.D. On: 12/26/2013 11:41    Oren Binet, MD  [facility] Pager:[phone]  If  7PM-7AM, please contact night-coverage [email or web address] Password [email or web address] 01/15/2014, 12:28 PM   LOS: 6 days   **Disclaimer: This note may have been dictated with voice recognition software. Similar sounding words can inadvertently be transcribed and this note may contain transcription errors which may not have been corrected upon publication of note.**

## 2014-01-15 NOTE — Clinical Social Work Note (Signed)
Note faxed to PO officer per patient's request. [place] signing off at this time.  Liz Beach MSW, [place], [place], [phone]

## 2014-01-15 NOTE — Telephone Encounter (Signed)
Patient's case worker called to schedule a HFU visit for patient; case worker was informed of the availability and stated that the patient would call in the morning (9/10) to schedule for Monday 01/20/2014; please schedule patient on HFU list should he call to make his appointment;

## 2014-01-15 NOTE — Progress Notes (Signed)
Discharge instructions given. Pt verbalized understand and all questions were answered.

## 2014-01-15 NOTE — Discharge Summary (Signed)
PATIENT DETAILS Name: [name] Age: 43 y.o. Sex: male Date of Birth: 1970/05/26 MRN: [id number]. Admitting Physician: Elmarie Shiley, MD [id number], Gabrielle Dare, MD  Admit Date: 01/09/2014 Discharge date: 01/15/2014  Recommendations for Outpatient Follow-up:  1. Needs ongoing counseling regarding importance of abstaining from alcohol use 2. Please refer to outpatient pain management  PRIMARY DISCHARGE DIAGNOSIS:  Active Problems:   HYPERTENSION   LIVER FUNCTION TESTS, ABNORMAL, HX OF   ABDOMINAL PAIN   [name] Parkinson White pattern seen on electrocardiogram   Alcohol dependence   Pancreatic pseudocyst/cyst   Pancreatitis   Pancreatitis, acute      PAST MEDICAL HISTORY: Past Medical History  Diagnosis Date  . Hypertension   . Asthma   . Pancreatitis   . Cocaine abuse   . Depression   . H/O suicide attempt   . Heart murmur     "when he was little" (03/06/2013)  . Shortness of breath     "can happen at anytime" (03/06/2013)  . Anemia   . H/O hiatal hernia   . GERD (gastroesophageal reflux disease)   . Arthritis     "knees" (03/06/2013)  . Anxiety   . WPW (Wolff-Parkinson-White syndrome)     [name] 03/06/2013    DISCHARGE MEDICATIONS:   Medication List    STOP taking these medications       acetaminophen 500 MG tablet  Commonly known as:  TYLENOL      TAKE these medications       albuterol 108 (90 BASE) MCG/ACT inhaler  Commonly known as:  PROVENTIL HFA;VENTOLIN HFA  Inhale 1-2 puffs into the lungs every 6 (six) hours as needed for wheezing or shortness of breath.     feeding supplement (ENSURE COMPLETE) Liqd  Take 237 mLs by mouth 2 (two) times daily between meals.     folic acid 1 MG tablet  Commonly known as:  FOLVITE  Take 1 tablet (1 mg total) by mouth daily.     gi cocktail Susp suspension  Take 30 mLs by mouth 3 (three) times daily as needed for indigestion. Shake well.     lipase/protease/amylase 12000 UNITS Cpep capsule    Commonly known as:  CREON  Take 2 capsules (24,000 Units total) by mouth 3 (three) times daily before meals.     metoprolol 50 MG tablet  Commonly known as:  LOPRESSOR  Take 1 tablet (50 mg total) by mouth 2 (two) times daily. For high blood pressure     multivitamin with minerals Tabs tablet  Take 1 tablet by mouth daily. For low vitamin     ondansetron 4 MG tablet  Commonly known as:  ZOFRAN  Take 4 mg by mouth every 6 (six) hours as needed for nausea or vomiting.     oxyCODONE 5 MG immediate release tablet  Commonly known as:  Oxy IR/ROXICODONE  Take 2-3 tablets (10-15 mg total) by mouth every 6 (six) hours as needed for severe pain.     pantoprazole 40 MG tablet  Commonly known as:  PROTONIX  Take 1 tablet (40 mg total) by mouth 2 (two) times daily.     QUEtiapine 100 MG tablet  Commonly known as:  SEROQUEL  Take 1 tablet (100 mg total) by mouth at bedtime. For mood control     sertraline 25 MG tablet  Commonly known as:  ZOLOFT  Take 3 tablets (75 mg total) by mouth daily. For depression     thiamine 100 MG tablet  Take  1 tablet (100 mg total) by mouth daily.        ALLERGIES:   Allergies  Allergen Reactions  . Trazodone And Nefazodone Other (See Comments)    Muscle spasms  . Shellfish-Derived Products Nausea And Vomiting    BRIEF HPI:  See H&P, Labs, Consult and Test reports for all details in brief, patient is a 43 year old African American male with numerous recent admissions for alcoholic pink edematous, has a history of 20 pancreatitis who presented to the ED for evaluation of abdominal pain. Per H&P, he was drinking alcohol 4 days prior to admission (he told this M.D. that his last drink was 2-3 weeks back).  CONSULTATIONS:   None  PERTINENT RADIOLOGIC STUDIES: Ct Abdomen Pelvis W Contrast  01/09/2014   CLINICAL DATA:  Pancreatitis with pseudocyst.  Abdominal pain  EXAM: CT ABDOMEN AND PELVIS WITH CONTRAST  TECHNIQUE: Multidetector CT imaging of the  abdomen and pelvis was performed using the standard protocol following bolus administration of intravenous contrast.  CONTRAST:  47mL OMNIPAQUE IOHEXOL 300 MG/ML  SOLN  COMPARISON:  CT abdomen 12/26/2013  FINDINGS: Lung bases are clear.  Fatty infiltration of the liver without focal liver lesion. Gallbladder is elongated and distended but not thickened. Common bile duct nondilated. Pancreas is small.  Changes of pancreatitis have improved. There is less peripancreatic edema and pancreatic swelling compared with the prior study. 23 x 12 mm cyst in the uncinate process similar to the prior study compatible with a pseudocyst.  Negative for bowel obstruction. Scattered air-fluid levels may represent mild ileus. No ascites. Normal appendix.  Negative for mass or adenopathy. Prostate mildly enlarged. Mild bladder wall thickening unchanged.  No acute bony change. Disc degeneration in the lumbar spine especially L5-S1.  IMPRESSION: Interval improvement in pancreatic edema and changes of pancreatitis. 23 x 12 mm pseudocyst in the uncinate process similar to the prior study. No ascites.  Fatty liver   Electronically Signed   By: Franchot Gallo M.D.   On: 01/09/2014 13:43   Ct Abdomen Pelvis W Contrast  12/26/2013   ADDENDUM REPORT: 12/26/2013 20:05  ADDENDUM: Voice recognition error: The third impression should read "Mild thickening in urinary bladder wall raises question of a degree of cystitis". Original dictation by Dr. Jasmine December, addendum by Dr. Leonia Reeves  Findings conveyed toBurke Thompsonon 12/26/2013  at20:04.   Electronically Signed   By: Suzy Bouchard M.D.   On: 12/26/2013 20:05   12/26/2013   CLINICAL DATA:  Acute pancreatitis  EXAM: CT ABDOMEN AND PELVIS WITH CONTRAST  TECHNIQUE: Multidetector CT imaging of the abdomen and pelvis was performed using the standard protocol following bolus administration of intravenous contrast. Oral contrast was also administered.  CONTRAST:  174mL OMNIPAQUE IOHEXOL 300 MG/ML   SOLN  COMPARISON:  August 17, 2012  FINDINGS: Lung bases are clear.  Liver is prominent, measuring 17.9 cm in length. There is hepatic steatosis. No focal liver lesions are identified. There is no biliary duct dilatation. Gallbladder wall is not thickened.  Spleen and adrenals appear normal.  There is moderate inflammatory change surrounding the pancreas, primarily in the head and body regions. There is edema in the uncinate process of the pancreas. There is an apparent pseudocyst in the uncinate process of the pancreas measuring 2.1 by 0.8 cm. There are scattered areas of focal pancreatic duct dilatation. There is no calcification or non cystic mass in the pancreas. Inflammation is seen abutting the second and third portions of the duodenum adjacent to the pancreas with  mild wall thickening in this area consistent with a degree of duodenitis.  Kidneys bilaterally show no evidence of mass or hydronephrosis. There is no renal or ureteral calculus on either side.  In the pelvis, there is mild wall thickening of the urinary bladder. There is no pelvic mass or fluid collection. The appendix appears normal. Terminal ileum appears normal.  There is no appreciable bowel obstruction. No free air or portal venous air.  There is no ascites, adenopathy, or abscess in the abdomen or pelvis. There is atherosclerotic change in the aorta and iliac arteries without aneurysm. There is degenerative change at L5-S1 with vacuum phenomenon at this level. There are no blastic or lytic bone lesions. There is broad-based disc bulging at L4-5 and L5-S1.  IMPRESSION: Evidence of acute pancreatitis with mild peripancreatic fluid an apparent small pseudocyst in the uncinate process region. There is adjacent duodenitis due to the pancreatitis immediately adjacent to the duodenum in the second and third portions.  Appendix appears normal. No bowel obstruction. No abscess. No renal or ureteral calculus. No hydronephrosis.  Mild thickening in  urinary bladder wall raises question of a degree of appendicitis.  Liver enlarged with hepatic steatosis.  Electronically Signed: By: Lowella Grip M.D. On: 12/26/2013 11:41     PERTINENT LAB RESULTS: CBC: No results found for this basename: WBC, HGB, HCT, PLT,  in the last 72 hours CMET CMP     Component Value Date/Time   NA 139 01/12/2014 0511   K 4.7 01/12/2014 0511   CL 107 01/12/2014 0511   CO2 23 01/12/2014 0511   GLUCOSE 82 01/12/2014 0511   BUN 6 01/12/2014 0511   CREATININE 0.53 01/12/2014 0511   CREATININE 0.65 11/08/2012 1226   CALCIUM 8.8 01/12/2014 0511   PROT 7.0 01/10/2014 0615   ALBUMIN 3.2* 01/10/2014 0615   AST 36 01/10/2014 0615   ALT 36 01/10/2014 0615   ALKPHOS 114 01/10/2014 0615   BILITOT 0.9 01/10/2014 0615   GFRNONAA >90 01/12/2014 0511   GFRNONAA >89 11/08/2012 1226   GFRAA >90 01/12/2014 0511   GFRAA >89 11/08/2012 1226    GFR Estimated Creatinine Clearance: 105.8 ml/min (by C-G formula based on Cr of 0.53).  Recent Labs  01/14/14 0932 01/15/14 0629  LIPASE 53 66*   No results found for this basename: CKTOTAL, CKMB, CKMBINDEX, TROPONINI,  in the last 72 hours No components found with this basename: POCBNP,  No results found for this basename: DDIMER,  in the last 72 hours No results found for this basename: HGBA1C,  in the last 72 hours No results found for this basename: CHOL, HDL, LDLCALC, TRIG, CHOLHDL, LDLDIRECT,  in the last 72 hours No results found for this basename: TSH, T4TOTAL, FREET3, T3FREE, THYROIDAB,  in the last 72 hours No results found for this basename: VITAMINB12, FOLATE, FERRITIN, TIBC, IRON, RETICCTPCT,  in the last 72 hours Coags: No results found for this basename: PT, INR,  in the last 72 hours Microbiology: Recent Results (from the past 240 hour(s))  CLOSTRIDIUM DIFFICILE BY PCR     Status: None   Collection Time    01/09/14  4:59 PM      Result Value Ref Range Status   C difficile by pcr NEGATIVE  NEGATIVE Final     BRIEF HOSPITAL COURSE:    Active Problems: Acute on chronic pancreatitis - Unfortunately, patient has had numerous recent admissions for similar abdominal pain. He has been noncompliant and continues to consume alcohol, he has  been counseled numerous times by this M.D. and other MDs in his prior admissions regarding importance of complete abstinence. He was admitted, kept n.p.o., and given other supportive measures. His diet was slowly advanced, by day of discharge, he was able to tolerate a soft diet (ate a sandwich). Suspect that patient, has chronic abdominal pain, his current pain is at usual baseline and seems very comfortable ambulating in the room and hallway, all IV narcotics was discontinued on 9/9, he was witnessed to eat a sandwich, and tolerate a full liquid diet as well. His pain seems to be controlled with oral narcotics. I also suspect some amount of drug-seeking behavior, as the patient claimed to have vomited (not seen on video footage on 9/9-upon further questioning by this MD-he did acknowledge that he did not vomit-please see progress note from earlier today-9/9) and was requesting IV Dilaudid repeatedly. Patient has been counseled extensively, and repeatedly by this M.D. regarding the importance of avoiding any further alcohol use. He also is aware, that he needs to stay on a low-fat/soft diet till he sees his primary care practitioner. He is also aware that he needs to call his primary care practitioner's office tomorrow to schedule appointment. This is a very difficult situation, I have given him a few days supply of oxycodone, but suspect that long-term pain management would be difficult, and the patient would benefit from being referred to pain management in the outpatient setting. Please note, his abdominal exam is completely benign, a CT scan of the abdomen done on 9/3 showed pseudocyst to be stable, and significantly improved compared to his prior CAT scan. Please note, he did tell this M.D. today that his  last drink was 2-3 weeks back, however per H&P-his last weight was 4 days prior to this admission.  Suspected Drug Seeking behavior - Patient during this admission was noted to repeatedly asking for IV Dilaudid, in spite of a benign abdominal exam and improvement in his labs. On 9/9, he did notified his nurse to he apparently vomited the soup that he had just consumed. Video footage from his room was reviewed, it was observed that the patient dropped soup on the floor, and then claimed to the RN that he vomited. This M.D., along with unit director-Ms Estell Manor RN- spoke with the patient, and initially the patient claimed that he vomited. However upon informing him about the video footage, he did acknowledge that the soup dropped on the floor and he did not vomit. This M.D. counseled the patient extensively that he should not manufacture such symptoms, as these could lead to unnecessary investigations/treatments that could have harmfull side effects. During this conversation, patient repeatedly asked for IV narcotics specifically Dilaudid. I did explain that he needs to be transitioned to oral narcotics as his belly exam was benign, and that he was tolerating oral intake. Please see above, but suspect that patient probably will need outpatient referral to pain management.  History of pancreatic pseudocyst - Please check CT of the abdomen in the next 2-3 months.  Alcohol abuse - Patient was admitted, and started on Ativan as needed. By day of discharge no withdrawal signs were noted. Patient is completely awake, alert and emanating in the hallway without major issues. He was noted to be not tremulous as well.  Diarrhea - Patient did have 2-3 loose stools on a daily basis, C Diff PCR was negative. Suspect this may be from chronic pancreatitis.  Alcoholic hepatitis  - Liver enzymes significantly improved. - Please continue  to monitor LFTs periodically as an outpatient.  Tachycardia with history of WPW    -c/w Metoprolol-stable   Severe Malnutrition  -in the context of chronic illness - Resume supplements on discharge.  History of depression -c/w Seroquel,Zoloft  -denies any suicidal or homicidal ideation at present    [place]:  Subjective:   Soloman Vogan today has no headache,no chest abdominal pain,no new weakness tingling or numbness.  Objective:   Blood pressure 128/88, pulse 64, temperature 98.6 F (37 C), temperature source Oral, resp. rate 20, height 5\' 9"  (1.753 m), weight 63.504 kg (140 lb), SpO2 98.00%.  Intake/Output Summary (Last 24 hours) at 01/15/14 1756 Last data filed at 01/15/14 1300  Gross per 24 hour  Intake   3825 ml  Output    478 ml  Net   3347 ml   Filed Weights   01/09/14 0521 01/09/14 1415  Weight: 63.504 kg (140 lb) 63.504 kg (140 lb)    Exam Awake Alert, Oriented *3, No new F.N deficits, Normal affect [place].AT,PERRAL Supple Neck,No JVD, No cervical lymphadenopathy appriciated.  Symmetrical Chest wall movement, Good air movement bilaterally, CTAB RRR,No Gallops,Rubs or new Murmurs, No Parasternal Heave +ve B.Sounds, Abd Soft, very minimal tenderness in epigastric area, No organomegaly appriciated, No rebound -guarding or rigidity. No Cyanosis, Clubbing or edema, No new Rash or bruise  DISCHARGE CONDITION: Stable  DISPOSITION: Home  DISCHARGE INSTRUCTIONS:    Activity:  As tolerated   Follow with Primary MD  Angelica Chessman, MD -please call the [facility] on 9/10-to make an appt  Stay on low fat/soft diet for 1 week, before advancing further. Only advance after talking to your PCP  NO FURTHER ALCOHOL USE!!!!  Please get a complete blood count and chemistry panel checked by your Primary MD at your next visit, and again as instructed by your Primary MD.  Get Medicines reviewed and adjusted. Please take all your medications with you for your next visit with your Primary MD  Please request your Primary MD  to go over all hospital tests and procedure/radiological results at the follow up, please ask your Primary MD to get all Hospital records sent to his/her office.  If you experience worsening of your admission symptoms, develop shortness of breath, life threatening emergency, suicidal or homicidal thoughts you must seek medical attention immediately by calling 911 or calling your MD immediately  if symptoms less severe.  You must read complete instructions/literature along with all the possible adverse reactions/side effects for all the Medicines you take and that have been prescribed to you. Take any new Medicines after you have completely understood and accpet all the possible adverse reactions/side effects.   Do not drive when taking Pain medications.   Do not take more than prescribed Pain, Sleep and Anxiety Medications  Special Instructions: If you have smoked or chewed Tobacco  in the last 2 yrs please stop smoking, stop any regular Alcohol  and or any Recreational drug use.  Wear Seat belts while driving.  Please note  You were cared for by a hospitalist during your hospital stay. Once you are discharged, your primary care physician will handle any further medical issues. Please note that NO REFILLS for any discharge medications will be authorized once you are discharged, as it is imperative that you return to your primary care physician (or establish a relationship with a primary care physician if you do not have one) for your aftercare needs so that they can reassess your need  for medications and monitor your lab values.     Diet recommendation: Heart Healthy diet-soft -low fat diet      Discharge Instructions   Call MD for:  persistant nausea and vomiting    Complete by:  As directed      Call MD for:  severe uncontrolled pain    Complete by:  As directed      Diet - low sodium heart healthy    Complete by:  As directed   Stay on low fat diet/soft diet for 1 week-before  advancing to regular diet. Advance only after talking with your Primary MD     Increase activity slowly    Complete by:  As directed            Follow-up Information   Follow up with [facility]    . (patient will need to call  [phone] on Thursday 9/10 to make a hospital follow up appt, no more apt available til then. )    Contact information:   [place] [address] [phone]      Total Time spent on discharge equals 45 minutes.  SignedOren Binet 01/15/2014 5:56 PM  **Disclaimer: This note may have been dictated with voice recognition software. Similar sounding words can inadvertently be transcribed and this note may contain transcription errors which may not have been corrected upon publication of note.**

## 2014-01-15 NOTE — Discharge Instructions (Signed)
Follow with Primary MD  Angelica Chessman, MD -please call the [facility] on 9/10-to make an appt  Stay on low fat/soft diet for 1 week, before advancing further. Only advance after talking to your PCP  NO FURTHER ALCOHOL USE!!!!  Please get a complete blood count and chemistry panel checked by your Primary MD at your next visit, and again as instructed by your Primary MD.  Get Medicines reviewed and adjusted. Please take all your medications with you for your next visit with your Primary MD  Please request your Primary MD to go over all hospital tests and procedure/radiological results at the follow up, please ask your Primary MD to get all Hospital records sent to his/her office.  If you experience worsening of your admission symptoms, develop shortness of breath, life threatening emergency, suicidal or homicidal thoughts you must seek medical attention immediately by calling 911 or calling your MD immediately  if symptoms less severe.  You must read complete instructions/literature along with all the possible adverse reactions/side effects for all the Medicines you take and that have been prescribed to you. Take any new Medicines after you have completely understood and accpet all the possible adverse reactions/side effects.   Do not drive when taking Pain medications.   Do not take more than prescribed Pain, Sleep and Anxiety Medications  Special Instructions: If you have smoked or chewed Tobacco  in the last 2 yrs please stop smoking, stop any regular Alcohol  and or any Recreational drug use.  Wear Seat belts while driving.  Please note  You were cared for by a hospitalist during your hospital stay. Once you are discharged, your primary care physician will handle any further medical issues. Please note that NO REFILLS for any discharge medications will be authorized once you are discharged, as it is imperative that you return to your primary care physician (or establish  a relationship with a primary care physician if you do not have one) for your aftercare needs so that they can reassess your need for medications and monitor your lab values.       Acute Pancreatitis Acute pancreatitis is a disease in which the pancreas becomes suddenly inflamed. The pancreas is a large gland located behind your stomach. The pancreas produces enzymes that help digest food. The pancreas also releases the hormones glucagon and insulin that help regulate blood sugar. Damage to the pancreas occurs when the digestive enzymes from the pancreas are activated and begin attacking the pancreas before being released into the intestine. Most acute attacks last a couple of days and can cause serious complications. Some people become dehydrated and develop low blood pressure. In severe cases, bleeding into the pancreas can lead to shock and can be life-threatening. The lungs, heart, and kidneys may fail. CAUSES  Pancreatitis can happen to anyone. In some cases, the cause is unknown. Most cases are caused by:  Alcohol abuse.  Gallstones. Other less common causes are:  Certain medicines.  Exposure to certain chemicals.  Infection.  Damage caused by an accident (trauma).  Abdominal surgery. SYMPTOMS   Pain in the upper abdomen that may radiate to the back.  Tenderness and swelling of the abdomen.  Nausea and vomiting. DIAGNOSIS  Your caregiver will perform a physical exam. Blood and stool tests may be done to confirm the diagnosis. Imaging tests may also be done, such as X-rays, CT scans, or an ultrasound of the abdomen. TREATMENT  Treatment usually requires a stay in the hospital. Treatment may  include:  Pain medicine.  Fluid replacement through an intravenous line (IV).  Placing a tube in the stomach to remove stomach contents and control vomiting.  Not eating for 3 or 4 days. This gives your pancreas a rest, because enzymes are not being produced that can cause further  damage.  Antibiotic medicines if your condition is caused by an infection.  Surgery of the pancreas or gallbladder. HOME CARE INSTRUCTIONS   Follow the diet advised by your caregiver. This may involve avoiding alcohol and decreasing the amount of fat in your diet.  Eat smaller, more frequent meals. This reduces the amount of digestive juices the pancreas produces.  Drink enough fluids to keep your urine clear or pale yellow.  Only take over-the-counter or prescription medicines as directed by your caregiver.  Avoid drinking alcohol if it caused your condition.  Do not smoke.  Get plenty of rest.  Check your blood sugar at home as directed by your caregiver.  Keep all follow-up appointments as directed by your caregiver. SEEK MEDICAL CARE IF:   You do not recover as quickly as expected.  You develop new or worsening symptoms.  You have persistent pain, weakness, or nausea.  You recover and then have another episode of pain. SEEK IMMEDIATE MEDICAL CARE IF:   You are unable to eat or keep fluids down.  Your pain becomes severe.  You have a fever or persistent symptoms for more than 2 to 3 days.  You have a fever and your symptoms suddenly get worse.  Your skin or the white part of your eyes turn yellow (jaundice).  You develop vomiting.  You feel dizzy, or you faint.  Your blood sugar is high (over 300 mg/dL). MAKE SURE YOU:   Understand these instructions.  Will watch your condition.  Will get help right away if you are not doing well or get worse. Document Released: 04/25/2005 Document Revised: 10/25/2011 Document Reviewed: 08/04/2011 [facility] Patient Information 2015 [place], [place]. This information is not intended to replace advice given to you by your health care provider. Make sure you discuss any questions you have with your health care provider. Low-Fat Diet for Pancreatitis or Gallbladder Conditions A low-fat diet can be helpful if you have  pancreatitis or a gallbladder condition. With these conditions, your pancreas and gallbladder have trouble digesting fats. A healthy eating plan with less fat will help rest your pancreas and gallbladder and reduce your symptoms. WHAT DO I NEED TO KNOW ABOUT THIS DIET?  Eat a low-fat diet.  Reduce your fat intake to less than 20-30% of your total daily calories. This is less than 50-60 g of fat per day.  Remember that you need some fat in your diet. Ask your dietician what your daily goal should be.  Choose nonfat and low-fat healthy foods. Look for the words "nonfat," "low fat," or "fat free."  As a guide, look on the label and choose foods with less than 3 g of fat per serving. Eat only one serving.  Avoid alcohol.  Do not smoke. If you need help quitting, talk with your health care provider.  Eat small frequent meals instead of three large heavy meals. WHAT FOODS CAN I EAT? Grains Include healthy grains and starches such as potatoes, wheat bread, fiber-rich cereal, and brown rice. Choose whole grain options whenever possible. In adults, whole grains should account for 45-65% of your daily calories.  Fruits and Vegetables Eat plenty of fruits and vegetables. Fresh fruits and vegetables add fiber  to your diet. Meats and Other Protein Sources Eat lean meat such as chicken and pork. Trim any fat off of meat before cooking it. Eggs, fish, and beans are other sources of protein. In adults, these foods should account for 10-35% of your daily calories. Dairy Choose low-fat milk and dairy options. Dairy includes fat and protein, as well as calcium.  Fats and Oils Limit high-fat foods such as fried foods, sweets, baked goods, sugary drinks.  Other Creamy sauces and condiments, such as mayonnaise, can add extra fat. Think about whether or not you need to use them, or use smaller amounts or low fat options. WHAT FOODS ARE NOT RECOMMENDED?  High fat foods, such as:  [employer].  Ice  cream.  [place] toast.  Sweet rolls.  Pizza.  Cheese bread.  Foods covered with batter, butter, creamy sauces, or cheese.  Fried foods.  Sugary drinks and desserts.  Foods that cause gas or bloating Document Released: 04/30/2013 Document Reviewed: 04/30/2013 [facility] Patient Information 2015 [place], [place]. This information is not intended to replace advice given to you by your health care provider. Make sure you discuss any questions you have with your health care provider.

## 2014-01-15 NOTE — Progress Notes (Signed)
Chaplain followed up with Pt. Pt disclosed many emotional and spiritual matters. Chaplain explored his anxiety, anger, shame and guilt concerning recent failed relationship and substance abuse. Pt noted that he "drinks to not feel the pain" citing that while he's drunk he's ok but  "when [he] wakes up,[he] has to deal with the pain again. Pt expressed interest in counseling and support groups but concerned about expenses. Pt expressed anxiety about having to face family due to suicide attempts and willfully avoids his family. Chaplain also engaged in empathic listening and constructive ways to engage his emotion surrounding his ex-lover and substance abuse.  Delford Field 01/15/2014 4:26 PM

## 2014-01-22 ENCOUNTER — Encounter (HOSPITAL_COMMUNITY): Payer: Self-pay | Admitting: Emergency Medicine

## 2014-01-22 ENCOUNTER — Inpatient Hospital Stay (HOSPITAL_COMMUNITY)
Admission: EM | Admit: 2014-01-22 | Discharge: 2014-01-23 | DRG: 438 | Disposition: A | Discharge status: Home / Self Care | Payer: MEDICAID | Attending: Internal Medicine | Admitting: Internal Medicine

## 2014-01-22 DIAGNOSIS — J45909 Unspecified asthma, uncomplicated: Secondary | ICD-10-CM | POA: Diagnosis present

## 2014-01-22 DIAGNOSIS — F17200 Nicotine dependence, unspecified, uncomplicated: Secondary | ICD-10-CM | POA: Diagnosis present

## 2014-01-22 DIAGNOSIS — Z91013 Allergy to seafood: Secondary | ICD-10-CM

## 2014-01-22 DIAGNOSIS — K859 Acute pancreatitis without necrosis or infection, unspecified: Secondary | ICD-10-CM | POA: Diagnosis present

## 2014-01-22 DIAGNOSIS — R197 Diarrhea, unspecified: Secondary | ICD-10-CM | POA: Diagnosis present

## 2014-01-22 DIAGNOSIS — E43 Unspecified severe protein-calorie malnutrition: Secondary | ICD-10-CM | POA: Diagnosis present

## 2014-01-22 DIAGNOSIS — Z8249 Family history of ischemic heart disease and other diseases of the circulatory system: Secondary | ICD-10-CM

## 2014-01-22 DIAGNOSIS — IMO0002 Reserved for concepts with insufficient information to code with codable children: Secondary | ICD-10-CM

## 2014-01-22 DIAGNOSIS — R000 Tachycardia, unspecified: Secondary | ICD-10-CM | POA: Diagnosis present

## 2014-01-22 DIAGNOSIS — K863 Pseudocyst of pancreas: Secondary | ICD-10-CM

## 2014-01-22 DIAGNOSIS — K449 Diaphragmatic hernia without obstruction or gangrene: Secondary | ICD-10-CM | POA: Diagnosis present

## 2014-01-22 DIAGNOSIS — F329 Major depressive disorder, single episode, unspecified: Secondary | ICD-10-CM | POA: Diagnosis present

## 2014-01-22 DIAGNOSIS — K861 Other chronic pancreatitis: Secondary | ICD-10-CM | POA: Diagnosis present

## 2014-01-22 DIAGNOSIS — F3289 Other specified depressive episodes: Secondary | ICD-10-CM | POA: Diagnosis present

## 2014-01-22 DIAGNOSIS — E872 Acidosis, unspecified: Secondary | ICD-10-CM | POA: Diagnosis present

## 2014-01-22 DIAGNOSIS — Z720 Tobacco use: Secondary | ICD-10-CM | POA: Diagnosis present

## 2014-01-22 DIAGNOSIS — Z888 Allergy status to other drugs, medicaments and biological substances status: Secondary | ICD-10-CM

## 2014-01-22 DIAGNOSIS — K852 Alcohol induced acute pancreatitis without necrosis or infection: Secondary | ICD-10-CM

## 2014-01-22 DIAGNOSIS — K219 Gastro-esophageal reflux disease without esophagitis: Secondary | ICD-10-CM | POA: Diagnosis present

## 2014-01-22 DIAGNOSIS — K862 Cyst of pancreas: Secondary | ICD-10-CM | POA: Diagnosis present

## 2014-01-22 DIAGNOSIS — F1010 Alcohol abuse, uncomplicated: Secondary | ICD-10-CM | POA: Diagnosis present

## 2014-01-22 DIAGNOSIS — I10 Essential (primary) hypertension: Secondary | ICD-10-CM | POA: Diagnosis present

## 2014-01-22 DIAGNOSIS — F1310 Sedative, hypnotic or anxiolytic abuse, uncomplicated: Secondary | ICD-10-CM | POA: Diagnosis present

## 2014-01-22 DIAGNOSIS — I456 Pre-excitation syndrome: Secondary | ICD-10-CM | POA: Diagnosis present

## 2014-01-22 LAB — CBC WITH DIFFERENTIAL/PLATELET
Basophils Absolute: 0 10*3/uL (ref 0.0–0.1)
Basophils Relative: 0 % (ref 0–1)
EOS ABS: 0.7 10*3/uL (ref 0.0–0.7)
Eosinophils Relative: 11 % — ABNORMAL HIGH (ref 0–5)
HCT: 38.9 % — ABNORMAL LOW (ref 39.0–52.0)
HEMOGLOBIN: 13.7 g/dL (ref 13.0–17.0)
LYMPHS ABS: 2.3 10*3/uL (ref 0.7–4.0)
LYMPHS PCT: 35 % (ref 12–46)
MCH: 31.4 pg (ref 26.0–34.0)
MCHC: 35.2 g/dL (ref 30.0–36.0)
MCV: 89 fL (ref 78.0–100.0)
MONOS PCT: 10 % (ref 3–12)
Monocytes Absolute: 0.7 10*3/uL (ref 0.1–1.0)
NEUTROS PCT: 44 % (ref 43–77)
Neutro Abs: 2.8 10*3/uL (ref 1.7–7.7)
Platelets: 263 10*3/uL (ref 150–400)
RBC: 4.37 MIL/uL (ref 4.22–5.81)
RDW: 16.1 % — ABNORMAL HIGH (ref 11.5–15.5)
WBC: 6.5 10*3/uL (ref 4.0–10.5)

## 2014-01-22 LAB — COMPREHENSIVE METABOLIC PANEL
ALK PHOS: 121 U/L — AB (ref 39–117)
ALT: 28 U/L (ref 0–53)
ANION GAP: 19 — AB (ref 5–15)
AST: 45 U/L — ABNORMAL HIGH (ref 0–37)
Albumin: 3.7 g/dL (ref 3.5–5.2)
BUN: 6 mg/dL (ref 6–23)
CO2: 20 mEq/L (ref 19–32)
Calcium: 9.4 mg/dL (ref 8.4–10.5)
Chloride: 95 mEq/L — ABNORMAL LOW (ref 96–112)
Creatinine, Ser: 0.61 mg/dL (ref 0.50–1.35)
GFR calc Af Amer: >90 mL/min (ref >90)
GFR calc non Af Amer: >90 mL/min (ref >90)
Glucose, Bld: 109 mg/dL — ABNORMAL HIGH (ref 70–99)
Potassium: 3.8 mEq/L (ref 3.7–5.3)
Sodium: 134 mEq/L — ABNORMAL LOW (ref 137–147)
TOTAL PROTEIN: 8.1 g/dL (ref 6.0–8.3)
Total Bilirubin: 1 mg/dL (ref 0.3–1.2)

## 2014-01-22 LAB — ETHANOL: Alcohol, Ethyl (B): 61 mg/dL — ABNORMAL HIGH (ref 0–11)

## 2014-01-22 LAB — CBC
HCT: 34.2 % — ABNORMAL LOW (ref 39.0–52.0)
Hemoglobin: 11.8 g/dL — ABNORMAL LOW (ref 13.0–17.0)
MCH: 30.4 pg (ref 26.0–34.0)
MCHC: 34.5 g/dL (ref 30.0–36.0)
MCV: 88.1 fL (ref 78.0–100.0)
PLATELETS: 249 10*3/uL (ref 150–400)
RBC: 3.88 MIL/uL — ABNORMAL LOW (ref 4.22–5.81)
RDW: 16.2 % — AB (ref 11.5–15.5)
WBC: 8.7 10*3/uL (ref 4.0–10.5)

## 2014-01-22 LAB — RAPID URINE DRUG SCREEN, HOSP PERFORMED
Amphetamines: NOT DETECTED
Barbiturates: POSITIVE — AB
Benzodiazepines: NOT DETECTED
Cocaine: NOT DETECTED
OPIATES: NOT DETECTED
TETRAHYDROCANNABINOL: NOT DETECTED

## 2014-01-22 LAB — URINALYSIS, ROUTINE W REFLEX MICROSCOPIC
Glucose, UA: NEGATIVE mg/dL
Hgb urine dipstick: NEGATIVE
Ketones, ur: 15 mg/dL — AB
Leukocytes, UA: NEGATIVE
NITRITE: NEGATIVE
PROTEIN: NEGATIVE mg/dL
Specific Gravity, Urine: 1.022 (ref 1.005–1.030)
UROBILINOGEN UA: 0.2 mg/dL (ref 0.0–1.0)
pH: 5.5 (ref 5.0–8.0)

## 2014-01-22 LAB — LIPASE, BLOOD: Lipase: 269 U/L — ABNORMAL HIGH (ref 11–59)

## 2014-01-22 LAB — CREATININE, SERUM
Creatinine, Ser: 0.61 mg/dL (ref 0.50–1.35)
GFR calc Af Amer: >90 mL/min (ref >90)

## 2014-01-22 MED ORDER — CHLORHEXIDINE GLUCONATE 0.12 % MT SOLN
15.0000 mL | Freq: Two times a day (BID) | OROMUCOSAL | Status: DC
  Administered 2014-01-22 – 2014-01-23 (×2): 15 mL via OROMUCOSAL
  Filled 2014-01-22 (×5): qty 15

## 2014-01-22 MED ORDER — ONDANSETRON 4 MG PO TBDP
4.0000 mg | ORAL_TABLET | Freq: Four times a day (QID) | ORAL | Status: DC | PRN
  Filled 2014-01-22: qty 1

## 2014-01-22 MED ORDER — HEPARIN SODIUM (PORCINE) 5000 UNIT/ML IJ SOLN
5000.0000 [IU] | Freq: Three times a day (TID) | INTRAMUSCULAR | Status: DC
  Administered 2014-01-22 – 2014-01-23 (×3): 5000 [IU] via SUBCUTANEOUS
  Filled 2014-01-22 (×5): qty 1

## 2014-01-22 MED ORDER — CETYLPYRIDINIUM CHLORIDE 0.05 % MT LIQD
7.0000 mL | Freq: Two times a day (BID) | OROMUCOSAL | Status: DC
Start: 2014-01-22 — End: 2014-01-22

## 2014-01-22 MED ORDER — FOLIC ACID 1 MG PO TABS
1.0000 mg | ORAL_TABLET | Freq: Every day | ORAL | Status: DC
  Administered 2014-01-22 – 2014-01-23 (×2): 1 mg via ORAL
  Filled 2014-01-22 (×2): qty 1

## 2014-01-22 MED ORDER — OXYCODONE HCL 5 MG PO TABS
10.0000 mg | ORAL_TABLET | Freq: Four times a day (QID) | ORAL | Status: DC | PRN
  Administered 2014-01-23: 15 mg via ORAL
  Filled 2014-01-22: qty 3

## 2014-01-22 MED ORDER — HYDROMORPHONE HCL PF 1 MG/ML IJ SOLN
1.0000 mg | INTRAMUSCULAR | Status: DC | PRN
  Administered 2014-01-22 – 2014-01-23 (×13): 1 mg via INTRAVENOUS
  Filled 2014-01-22 (×13): qty 1

## 2014-01-22 MED ORDER — ADULT MULTIVITAMIN W/MINERALS CH
1.0000 | ORAL_TABLET | Freq: Every day | ORAL | Status: DC
  Administered 2014-01-22 – 2014-01-23 (×2): 1 via ORAL
  Filled 2014-01-22 (×2): qty 1

## 2014-01-22 MED ORDER — SODIUM CHLORIDE 0.9 % IV BOLUS (SEPSIS)
1000.0000 mL | Freq: Once | INTRAVENOUS | Status: AC
  Administered 2014-01-22: 1000 mL via INTRAVENOUS

## 2014-01-22 MED ORDER — HYDROMORPHONE HCL PF 1 MG/ML IJ SOLN
0.5000 mg | Freq: Once | INTRAMUSCULAR | Status: AC
  Administered 2014-01-22: 0.5 mg via INTRAVENOUS
  Filled 2014-01-22: qty 1

## 2014-01-22 MED ORDER — GI COCKTAIL ~~LOC~~
30.0000 mL | Freq: Three times a day (TID) | ORAL | Status: DC | PRN
  Administered 2014-01-22 – 2014-01-23 (×3): 30 mL via ORAL
  Filled 2014-01-22 (×4): qty 30

## 2014-01-22 MED ORDER — SODIUM CHLORIDE 0.9 % IJ SOLN
3.0000 mL | Freq: Two times a day (BID) | INTRAMUSCULAR | Status: DC
  Administered 2014-01-22 – 2014-01-23 (×2): 3 mL via INTRAVENOUS

## 2014-01-22 MED ORDER — PANCRELIPASE (LIP-PROT-AMYL) 12000-38000 UNITS PO CPEP
24000.0000 [IU] | ORAL_CAPSULE | Freq: Three times a day (TID) | ORAL | Status: DC
  Administered 2014-01-22 – 2014-01-23 (×3): 24000 [IU] via ORAL
  Filled 2014-01-22 (×6): qty 2

## 2014-01-22 MED ORDER — VITAMIN B-1 100 MG PO TABS
100.0000 mg | ORAL_TABLET | Freq: Every day | ORAL | Status: DC
  Administered 2014-01-23: 100 mg via ORAL
  Filled 2014-01-22: qty 1

## 2014-01-22 MED ORDER — CETYLPYRIDINIUM CHLORIDE 0.05 % MT LIQD
7.0000 mL | Freq: Two times a day (BID) | OROMUCOSAL | Status: DC
  Administered 2014-01-22 – 2014-01-23 (×2): 7 mL via OROMUCOSAL

## 2014-01-22 MED ORDER — M.V.I. ADULT IV INJ
INJECTION | Freq: Once | INTRAVENOUS | Status: AC
  Administered 2014-01-22: 11:00:00 via INTRAVENOUS
  Filled 2014-01-22: qty 1000

## 2014-01-22 MED ORDER — QUETIAPINE FUMARATE 100 MG PO TABS
100.0000 mg | ORAL_TABLET | Freq: Every day | ORAL | Status: DC
  Administered 2014-01-22: 100 mg via ORAL
  Filled 2014-01-22 (×2): qty 1

## 2014-01-22 MED ORDER — ONDANSETRON HCL 4 MG/2ML IJ SOLN
4.0000 mg | Freq: Once | INTRAMUSCULAR | Status: AC
  Administered 2014-01-22: 4 mg via INTRAVENOUS
  Filled 2014-01-22: qty 2

## 2014-01-22 MED ORDER — SODIUM CHLORIDE 0.9 % IV SOLN
INTRAVENOUS | Status: AC
  Administered 2014-01-22: 09:00:00 via INTRAVENOUS

## 2014-01-22 MED ORDER — PANTOPRAZOLE SODIUM 40 MG IV SOLR
40.0000 mg | Freq: Once | INTRAVENOUS | Status: AC
  Administered 2014-01-22: 40 mg via INTRAVENOUS
  Filled 2014-01-22: qty 40

## 2014-01-22 MED ORDER — ONDANSETRON HCL 4 MG PO TABS
4.0000 mg | ORAL_TABLET | Freq: Four times a day (QID) | ORAL | Status: DC | PRN
  Administered 2014-01-23: 4 mg via ORAL
  Filled 2014-01-22: qty 1

## 2014-01-22 MED ORDER — MORPHINE SULFATE 2 MG/ML IJ SOLN: 1.0000 mg | INTRAMUSCULAR | Status: DC | PRN

## 2014-01-22 MED ORDER — PANTOPRAZOLE SODIUM 40 MG PO TBEC
40.0000 mg | DELAYED_RELEASE_TABLET | Freq: Two times a day (BID) | ORAL | Status: DC
  Administered 2014-01-22 – 2014-01-23 (×3): 40 mg via ORAL
  Filled 2014-01-22 (×2): qty 1

## 2014-01-22 MED ORDER — SERTRALINE HCL 50 MG PO TABS
75.0000 mg | ORAL_TABLET | Freq: Every day | ORAL | Status: DC
  Administered 2014-01-22 – 2014-01-23 (×2): 75 mg via ORAL
  Filled 2014-01-22 (×2): qty 1

## 2014-01-22 MED ORDER — LOPERAMIDE HCL 2 MG PO CAPS: 2.0000 mg | ORAL_CAPSULE | ORAL | Status: DC | PRN

## 2014-01-22 MED ORDER — CHLORDIAZEPOXIDE HCL 25 MG PO CAPS: 25.0000 mg | ORAL_CAPSULE | Freq: Four times a day (QID) | ORAL | Status: DC | PRN

## 2014-01-22 MED ORDER — METOPROLOL TARTRATE 50 MG PO TABS
50.0000 mg | ORAL_TABLET | Freq: Two times a day (BID) | ORAL | Status: DC
  Administered 2014-01-22 – 2014-01-23 (×3): 50 mg via ORAL
  Filled 2014-01-22 (×4): qty 1

## 2014-01-22 MED ORDER — THIAMINE HCL 100 MG/ML IJ SOLN
100.0000 mg | Freq: Once | INTRAMUSCULAR | Status: AC
  Administered 2014-01-22: 100 mg via INTRAMUSCULAR
  Filled 2014-01-22: qty 1

## 2014-01-22 MED ORDER — HYDROXYZINE HCL 25 MG PO TABS: 25.0000 mg | ORAL_TABLET | Freq: Four times a day (QID) | ORAL | Status: DC | PRN

## 2014-01-22 MED ORDER — HYDROMORPHONE HCL PF 1 MG/ML IJ SOLN
1.0000 mg | Freq: Once | INTRAMUSCULAR | Status: AC
  Administered 2014-01-22: 1 mg via INTRAVENOUS
  Filled 2014-01-22: qty 1

## 2014-01-22 MED ORDER — SODIUM CHLORIDE 0.9 % IV SOLN: INTRAVENOUS | Status: DC

## 2014-01-22 NOTE — H&P (Signed)
[facility] History and Physical  [name] KCL:[id number] DOB: 1970/11/26 DOA: 01/22/2014  Referring physician: ED  PCP: Angelica Chessman, MD  Specialists:  none  Chief Complaint: Abdominal pain  HPI: 43 y/o ?, history chronic pancreatitis, pseudocyst, prior substance abuse, history suicidal attempt, history hiatal hernia, reflux, Wolff-Parkinson-White syndrome multiple frequent admissions to hospital for acute pancreatitis flares secondary to alcohol use Admitted 7/21, 8/25, 01/09/14 He states that when he went home after last discharge on 01/15/14 he continued to have chronic diarrhea stools "15 per day". He also states that his pain was not completely resolved. He was able however interestingly to tolerate pizza and chicken as of yesterday although prior to this he was not able to eat anything Nevertheless he developed the/10 abdominal pain which prompted him to come to the emergency room He states he had 2x24 ounce cans of alcohol [beer] and had not drunk subsequent to discharge prior to this. It is notable that patient has concerns by discharging physician last time for issues of chronic pain with some confabulation of findings and symptoms.  In the emergency room was noted to have sinus tachycardia 120s to 130s blood pressure is 140s over 49S Complete metabolic panel significant for anion gap metabolic acidosis lipase 496 AST 45/ALT 20 8 alkaline phosphatase 121 glucose 109 Urine analysis showed small ketones Blood alcohol level 61 Urinary drug screen positive for barbiturates    Review of Systems: The patient denies  Fever Cough Cold Il contacts Rash  Lower extremity edema Chest pain Blurred vision Double vision Unilateral weakness Seizure  Past Medical History  Diagnosis Date  . Hypertension   . Asthma   . Pancreatitis   . Cocaine abuse   . Depression   . H/O suicide attempt   . Heart murmur     "when he was little" (03/06/2013)  . Shortness of  breath     "can happen at anytime" (03/06/2013)  . Anemia   . H/O hiatal hernia   . GERD (gastroesophageal reflux disease)   . Arthritis     "knees" (03/06/2013)  . Anxiety   . WPW (Wolff-Parkinson-White syndrome)     [name] 03/06/2013   Past Surgical History  Procedure Laterality Date  . Facial fracture surgery Left 1990's    "result of trauma" (03/06/2013)  . Eye surgery Left 1990's    "result of trauma" (03/06/2013)   Social History:  History   Social History Narrative  . No narrative on file    Allergies  Allergen Reactions  . Trazodone And Nefazodone Other (See Comments)    Muscle spasms  . Shellfish-Derived Products Nausea And Vomiting    Family History  Problem Relation Age of Onset  . Hypertension Other   . Coronary artery disease Other     Prior to Admission medications   Medication Sig Start Date End Date Taking? Authorizing Provider  albuterol (PROVENTIL HFA;VENTOLIN HFA) 108 (90 BASE) MCG/ACT inhaler Inhale 1-2 puffs into the lungs every 6 (six) hours as needed for wheezing or shortness of breath. 11/26/13  Yes Shanker Kristeen Mans, MD  Alum & Mag Hydroxide-Simeth (GI COCKTAIL) SUSP suspension Take 30 mLs by mouth 3 (three) times daily as needed for indigestion. Shake well. 01/15/14  Yes Shanker Kristeen Mans, MD  feeding supplement, ENSURE COMPLETE, (ENSURE COMPLETE) LIQD Take 237 mLs by mouth 2 (two) times daily between meals. 01/15/14  Yes Shanker Kristeen Mans, MD  folic acid (FOLVITE) 1 MG tablet Take 1 tablet (1 mg total) by mouth  daily. 11/26/13  Yes Shanker Kristeen Mans, MD  lipase/protease/amylase (CREON) 12000 UNITS CPEP capsule Take 2 capsules (24,000 Units total) by mouth 3 (three) times daily before meals. 01/15/14  Yes Shanker Kristeen Mans, MD  metoprolol (LOPRESSOR) 50 MG tablet Take 1 tablet (50 mg total) by mouth 2 (two) times daily. For high blood pressure 11/28/13  Yes [name], MD  Multiple Vitamin (MULTIVITAMIN WITH MINERALS) TABS tablet Take 1 tablet  by mouth daily. For low vitamin 11/28/13  Yes Olugbemiga E Doreene Burke, MD  ondansetron (ZOFRAN) 4 MG tablet Take 4 mg by mouth every 6 (six) hours as needed for nausea or vomiting.   Yes Historical Provider, MD  oxyCODONE (OXY IR/ROXICODONE) 5 MG immediate release tablet Take 2-3 tablets (10-15 mg total) by mouth every 6 (six) hours as needed for severe pain. 01/15/14  Yes Shanker Kristeen Mans, MD  pantoprazole (PROTONIX) 40 MG tablet Take 1 tablet (40 mg total) by mouth 2 (two) times daily. 11/28/13  Yes [name], MD  QUEtiapine (SEROQUEL) 100 MG tablet Take 1 tablet (100 mg total) by mouth at bedtime. For mood control 01/15/14  Yes Shanker Kristeen Mans, MD  sertraline (ZOLOFT) 25 MG tablet Take 3 tablets (75 mg total) by mouth daily. For depression 11/28/13  Yes Olugbemiga Essie Christine, MD  thiamine 100 MG tablet Take 1 tablet (100 mg total) by mouth daily. 11/28/13  Yes [name], MD   Physical Exam: Filed Vitals:   01/22/14 0548 01/22/14 0630 01/22/14 0730 01/22/14 0800  BP: 147/100 143/90 134/100 139/97  Pulse: 115 141 107 108  Temp: 98.4 F (36.9 C)     TempSrc: Oral     Resp: 16 24 16 18   SpO2: 100% 95% 97% 100%     General:  Alert oriented anxious  Eyes:  No icterus no pallor  ENT:  Moderate dentition  Neck:  Soft supple no JVD no brief  Cardiovascular:  S1-S2 tachycardic, no murmur noted  Respiratory:  Clinically clear  Abdomen:  Soft nontender no rebound or guarding no epigastric tenderness however percussion over the thoracic spine reproduce some tenderness  Skin:  No lower extremity edema  Musculoskeletal:  Range of motion intact  Psychiatric:  Anxious  Neurologic:  Cranial nerves II through XII grossly intact, power 5/5, reflexes 2/3, sensory grossly intact  Labs on Admission:  Basic Metabolic Panel:  Recent Labs Lab 01/22/14 0600  NA 134*  K 3.8  CL 95*  CO2 20  GLUCOSE 109*  BUN 6  CREATININE 0.61  CALCIUM 9.4   Liver Function Tests:  Recent  Labs Lab 01/22/14 0600  AST 45*  ALT 28  ALKPHOS 121*  BILITOT 1.0  PROT 8.1  ALBUMIN 3.7    Recent Labs Lab 01/22/14 0600  LIPASE 269*   No results found for this basename: AMMONIA,  in the last 168 hours CBC:  Recent Labs Lab 01/22/14 0600  WBC 6.5  NEUTROABS 2.8  HGB 13.7  HCT 38.9*  MCV 89.0  PLT 263   Cardiac Enzymes: No results found for this basename: CKTOTAL, CKMB, CKMBINDEX, TROPONINI,  in the last 168 hours  BNP (last 3 results) No results found for this basename: PROBNP,  in the last 8760 hours CBG: No results found for this basename: GLUCAP,  in the last 168 hours  Radiological Exams on Admission: No results found.  EKG: Independently reviewed.  None performed  Assessment/Plan Principal Problem:   Pancreatitis, alcoholic, [id number] and the patient for symptomatic  management including IV fluids, TPN, soft diet. We will trend lipase and probably start either clear liquids today and then a soft diet tomorrow and see how he does. I would ensure that he has at least 2-3 meals without any further abdominal pain or GI symptomatology prior to discharging home. He does have ketones in his urine which suggest to me he might have some element of starvation ketosis so continue IV fluids at 125 cc per hour--patient is to first obtain banana bag   Mild metabolic acidosis probably from starvation ketosis as well as ethanolism. Repeat complete metabolic panel in am     Diarrhea-C. difficile PCR has been performed and is pending-contact precautions until results.  He likely has steatorrhoea and I'm not sure that he complies with his Creon 24,000 units 3 times a day    ALCOHOL ABUSE-CIWA protocol Librium    TOBACCO ABUSE-monitor    DEPRESSION-he will continue quetiapine 100 each bedtime, Senokot 75 daily    ASTHMA-no current wheeze. Hold inhalers    HIATAL HERNIA WITH REFLUX-patient will need PPI pantoprazole 40 twice a day    [employer] pattern seen on  electrocardiogram-continue metoprolol 50 mg twice a day    Protein-calorie malnutrition, severe-monitor, supplemental stated  Full code   no family at bedside Inpatient  Time spent: [place], [facility] Pager [phone]  If 7PM-7AM, please contact night-coverage [email or web address] Password [facility] 01/22/2014, 8:41 AM

## 2014-01-22 NOTE — Progress Notes (Signed)
Utilization review completed. Sakura Denis, RN, BSN. 

## 2014-01-22 NOTE — Care Management Note (Addendum)
    Page 1 of 1   01/23/2014     3:38:46 PM CARE MANAGEMENT NOTE 01/23/2014  Patient:  [name], [name]   Account Number:  [id number]  Date Initiated:  01/22/2014  Documentation initiated by:  Tomi Bamberger  Subjective/Objective Assessment:   dx etoh pancreatitis  admit-     Action/Plan:   Anticipated DC Date:  01/23/2014   Anticipated DC Plan:  [place]  CM consult  [facility]  Medication Assistance      Choice offered to / List presented to:             Status of service:  Completed, signed off Medicare Important Message given?  NO (If response is "NO", the following Medicare IM given date fields will be blank) Date Medicare IM given:   Medicare IM given by:   Date Additional Medicare IM given:   Additional Medicare IM given by:    Discharge Disposition:  HOME/SELF CARE  Per UR Regulation:  Reviewed for med. necessity/level of care/duration of stay  If discussed at [place] of Stay Meetings, dates discussed:    Comments:  01/23/14 [place], BSN [phone] patient for dc today, NCM will explain to him again about going to the [facility] and about getting asst with pass program for creon.  Patient has used the Match program so he is not eligible at this time.  Patient will be on hydroxyzine 25mg  is $4 at the [facility], NCM informed patient and Staff RN.  Informed patient that he has an apt tomorrow at the [facility] clinic.  01/22/14 [place], BSN [phone] patient has used the [employer] in the past and is not eligible to use the [employer],  Patient was informed on last admission to contact the [facility] to make  hospital follow up appt.  NCM called the [facility] to see if this was done, they stated patient did not call to make a f/u apt.  Patient will need to pick meds up from [facility] at [facility] if dc Mon- Fri , and he will need to fill out pass program paper work for creon if needed.  If patient is dc  over the weekend he will need meds on the $4 [facility] list.

## 2014-01-22 NOTE — ED Notes (Signed)
Pt arrives via EMS with c/o diffuse abdominal pain, states she recently d/c'd from hospital with pancreatitis. Tonight drank 2 40s and took vicodin PTA. Now states he has "belly pain. VSS, 150/80, HR 124, RR 16.

## 2014-01-22 NOTE — ED Provider Notes (Signed)
CSN: [id number]     Arrival date & time 01/22/14  0533 History   First MD Initiated Contact with Patient 01/22/14 0602     Chief Complaint  Patient presents with  . Abdominal Pain     (Consider location/radiation/quality/duration/timing/severity/associated sxs/prior Treatment) Patient is a 43 y.o. male presenting with abdominal pain. The history is provided by the patient. No language interpreter was used.  Abdominal Pain Pain location:  LUQ and epigastric Pain quality: sharp   Associated symptoms: diarrhea and nausea   Associated symptoms: no chills and no fever   Associated symptoms comment:  He presents for recurrent/chronic abdominal pain with history of alcoholic pancreatitis. His last inpatient admissions 7/21, 8/25, 9/9 with ED visits 8/31, 9/3 returning today for symptoms consistent with chronic history. No fever. He complains of persistent non-bloody diarrhea. He admits to drinking alcohol yesterday which he states is the first time since his last hospitalization.   Past Medical History  Diagnosis Date  . Hypertension   . Asthma   . Pancreatitis   . Cocaine abuse   . Depression   . H/O suicide attempt   . Heart murmur     "when he was little" (03/06/2013)  . Shortness of breath     "can happen at anytime" (03/06/2013)  . Anemia   . H/O hiatal hernia   . GERD (gastroesophageal reflux disease)   . Arthritis     "knees" (03/06/2013)  . Anxiety   . WPW (Wolff-Parkinson-White syndrome)     [name] 03/06/2013   Past Surgical History  Procedure Laterality Date  . Facial fracture surgery Left 1990's    "result of trauma" (03/06/2013)  . Eye surgery Left 1990's    "result of trauma" (03/06/2013)   Family History  Problem Relation Age of Onset  . Hypertension Other   . Coronary artery disease Other    History  Substance Use Topics  . Smoking status: Current Every Day Smoker -- 1.50 packs/day for 30 years    Types: Cigarettes  . Smokeless tobacco: Current User  .  Alcohol Use: Yes     Comment: 12 pk beer daily 12oz cans liquor occasionally    Review of Systems  Constitutional: Negative for fever and chills.  HENT: Negative.   Respiratory: Negative.   Cardiovascular: Negative.   Gastrointestinal: Positive for nausea, abdominal pain and diarrhea. Negative for blood in stool.  Genitourinary: Negative.   Musculoskeletal: Negative.   Skin: Negative.   Neurological: Negative.       Allergies  Trazodone and nefazodone and Shellfish-derived products  Home Medications   Prior to Admission medications   Medication Sig Start Date End Date Taking? Authorizing Provider  albuterol (PROVENTIL HFA;VENTOLIN HFA) 108 (90 BASE) MCG/ACT inhaler Inhale 1-2 puffs into the lungs every 6 (six) hours as needed for wheezing or shortness of breath. 11/26/13  Yes Shanker Kristeen Mans, MD  Alum & Mag Hydroxide-Simeth (GI COCKTAIL) SUSP suspension Take 30 mLs by mouth 3 (three) times daily as needed for indigestion. Shake well. 01/15/14  Yes Shanker Kristeen Mans, MD  feeding supplement, ENSURE COMPLETE, (ENSURE COMPLETE) LIQD Take 237 mLs by mouth 2 (two) times daily between meals. 01/15/14  Yes Shanker Kristeen Mans, MD  folic acid (FOLVITE) 1 MG tablet Take 1 tablet (1 mg total) by mouth daily. 11/26/13  Yes Shanker Kristeen Mans, MD  lipase/protease/amylase (CREON) 12000 UNITS CPEP capsule Take 2 capsules (24,000 Units total) by mouth 3 (three) times daily before meals. 01/15/14  Yes Shanker Kristeen Mans,  MD  metoprolol (LOPRESSOR) 50 MG tablet Take 1 tablet (50 mg total) by mouth 2 (two) times daily. For high blood pressure 11/28/13  Yes [name], MD  Multiple Vitamin (MULTIVITAMIN WITH MINERALS) TABS tablet Take 1 tablet by mouth daily. For low vitamin 11/28/13  Yes Olugbemiga E Doreene Burke, MD  ondansetron (ZOFRAN) 4 MG tablet Take 4 mg by mouth every 6 (six) hours as needed for nausea or vomiting.   Yes Historical Provider, MD  oxyCODONE (OXY IR/ROXICODONE) 5 MG immediate release tablet  Take 2-3 tablets (10-15 mg total) by mouth every 6 (six) hours as needed for severe pain. 01/15/14  Yes Shanker Kristeen Mans, MD  pantoprazole (PROTONIX) 40 MG tablet Take 1 tablet (40 mg total) by mouth 2 (two) times daily. 11/28/13  Yes [name], MD  QUEtiapine (SEROQUEL) 100 MG tablet Take 1 tablet (100 mg total) by mouth at bedtime. For mood control 01/15/14  Yes Shanker Kristeen Mans, MD  sertraline (ZOLOFT) 25 MG tablet Take 3 tablets (75 mg total) by mouth daily. For depression 11/28/13  Yes Olugbemiga Essie Christine, MD  thiamine 100 MG tablet Take 1 tablet (100 mg total) by mouth daily. 11/28/13  Yes Olugbemiga E Doreene Burke, MD   BP 147/100  Pulse 115  Temp(Src) 98.4 F (36.9 C) (Oral)  Resp 16  SpO2 100% Physical Exam  Constitutional: He is oriented to person, place, and time. He appears well-developed and well-nourished.  HENT:  Head: Normocephalic.  Mouth/Throat: Mucous membranes are dry.  Neck: Normal range of motion. Neck supple.  Cardiovascular: Normal rate and regular rhythm.   Pulmonary/Chest: Effort normal and breath sounds normal.  Abdominal: Soft. Bowel sounds are normal. There is tenderness. There is no rebound and no guarding.  Upper abdominal tenderness with moderate guarding. No peritoneal rigidity or abdominal distention.  Musculoskeletal: Normal range of motion.  Neurological: He is alert and oriented to person, place, and time.  Skin: Skin is warm and dry. No rash noted.  Psychiatric: He has a normal mood and affect.    ED Course  Procedures (including critical care time) Labs Review Labs Reviewed  CBC WITH DIFFERENTIAL - Abnormal; Notable for the following:    HCT 38.9 (*)    RDW 16.1 (*)    Eosinophils Relative 11 (*)    All other components within normal limits  COMPREHENSIVE METABOLIC PANEL  LIPASE, BLOOD  ETHANOL  URINE RAPID DRUG SCREEN (HOSP PERFORMED)  URINALYSIS, ROUTINE W REFLEX MICROSCOPIC   Results for orders placed during the hospital encounter of  01/22/14  CBC WITH DIFFERENTIAL      Result Value Ref Range   WBC 6.5  4.0 - 10.5 K/uL   RBC 4.37  4.22 - 5.81 MIL/uL   Hemoglobin 13.7  13.0 - 17.0 g/dL   HCT 38.9 (*) 39.0 - 52.0 %   MCV 89.0  78.0 - 100.0 fL   MCH 31.4  26.0 - 34.0 pg   MCHC 35.2  30.0 - 36.0 g/dL   RDW 16.1 (*) 11.5 - 15.5 %   Platelets 263  150 - 400 K/uL   Neutrophils Relative % 44  43 - 77 %   Neutro Abs 2.8  1.7 - 7.7 K/uL   Lymphocytes Relative 35  12 - 46 %   Lymphs Abs 2.3  0.7 - 4.0 K/uL   Monocytes Relative 10  3 - 12 %   Monocytes Absolute 0.7  0.1 - 1.0 K/uL   Eosinophils Relative 11 (*) 0 -  5 %   Eosinophils Absolute 0.7  0.0 - 0.7 K/uL   Basophils Relative 0  0 - 1 %   Basophils Absolute 0.0  0.0 - 0.1 K/uL  COMPREHENSIVE METABOLIC PANEL      Result Value Ref Range   Sodium 134 (*) 137 - 147 mEq/L   Potassium 3.8  3.7 - 5.3 mEq/L   Chloride 95 (*) 96 - 112 mEq/L   CO2 20  19 - 32 mEq/L   Glucose, Bld 109 (*) 70 - 99 mg/dL   BUN 6  6 - 23 mg/dL   Creatinine, Ser 0.61  0.50 - 1.35 mg/dL   Calcium 9.4  8.4 - 10.5 mg/dL   Total Protein 8.1  6.0 - 8.3 g/dL   Albumin 3.7  3.5 - 5.2 g/dL   AST 45 (*) 0 - 37 U/L   ALT 28  0 - 53 U/L   Alkaline Phosphatase 121 (*) 39 - 117 U/L   Total Bilirubin 1.0  0.3 - 1.2 mg/dL   GFR calc non Af Amer >90  >90 mL/min   GFR calc Af Amer >90  >90 mL/min   Anion gap 19 (*) 5 - 15  LIPASE, BLOOD      Result Value Ref Range   Lipase 269 (*) 11 - 59 U/L  ETHANOL      Result Value Ref Range   Alcohol, Ethyl (B) 61 (*) 0 - 11 mg/dL  URINE RAPID DRUG SCREEN (HOSP PERFORMED)      Result Value Ref Range   Opiates NONE DETECTED  NONE DETECTED   Cocaine NONE DETECTED  NONE DETECTED   Benzodiazepines NONE DETECTED  NONE DETECTED   Amphetamines NONE DETECTED  NONE DETECTED   Tetrahydrocannabinol NONE DETECTED  NONE DETECTED   Barbiturates POSITIVE (*) NONE DETECTED  URINALYSIS, ROUTINE W REFLEX MICROSCOPIC      Result Value Ref Range   Color, Urine AMBER (*)  YELLOW   APPearance CLEAR  CLEAR   Specific Gravity, Urine 1.022  1.005 - 1.030   pH 5.5  5.0 - 8.0   Glucose, UA NEGATIVE  NEGATIVE mg/dL   Hgb urine dipstick NEGATIVE  NEGATIVE   Bilirubin Urine SMALL (*) NEGATIVE   Ketones, ur 15 (*) NEGATIVE mg/dL   Protein, ur NEGATIVE  NEGATIVE mg/dL   Urobilinogen, UA 0.2  0.0 - 1.0 mg/dL   Nitrite NEGATIVE  NEGATIVE   Leukocytes, UA NEGATIVE  NEGATIVE     Imaging Review No results found.   EKG Interpretation None      MDM   Final diagnoses:  None    1. Acute alcoholic pancreatitis  He is stable, vital signs improving (less tachycardic). He has persistent pain without vomiting. No diarrhea in ED. Afebrile. Lipase elevated to 269 (last 66 one week prior). Will admit for recurrent/chronic pancreatitis. Discussed with Dr. Verlon Au of [facility] who accepts patient.    Dewaine Oats, PA-C 01/22/14 [phone]

## 2014-01-23 DIAGNOSIS — K859 Acute pancreatitis without necrosis or infection, unspecified: Principal | ICD-10-CM

## 2014-01-23 LAB — CBC
HEMATOCRIT: 32.7 % — AB (ref 39.0–52.0)
Hemoglobin: 11.1 g/dL — ABNORMAL LOW (ref 13.0–17.0)
MCH: 30.4 pg (ref 26.0–34.0)
MCHC: 33.9 g/dL (ref 30.0–36.0)
MCV: 89.6 fL (ref 78.0–100.0)
Platelets: 216 10*3/uL (ref 150–400)
RBC: 3.65 MIL/uL — ABNORMAL LOW (ref 4.22–5.81)
RDW: 15.9 % — ABNORMAL HIGH (ref 11.5–15.5)
WBC: 8.4 10*3/uL (ref 4.0–10.5)

## 2014-01-23 LAB — COMPREHENSIVE METABOLIC PANEL
ALBUMIN: 3.4 g/dL — AB (ref 3.5–5.2)
ALK PHOS: 112 U/L (ref 39–117)
ALT: 32 U/L (ref 0–53)
AST: 52 U/L — ABNORMAL HIGH (ref 0–37)
Anion gap: 13 (ref 5–15)
BUN: 3 mg/dL — ABNORMAL LOW (ref 6–23)
CHLORIDE: 103 meq/L (ref 96–112)
CO2: 24 mEq/L (ref 19–32)
Calcium: 9 mg/dL (ref 8.4–10.5)
Creatinine, Ser: 0.56 mg/dL (ref 0.50–1.35)
GFR calc non Af Amer: >90 mL/min (ref >90)
GLUCOSE: 85 mg/dL (ref 70–99)
POTASSIUM: 4.3 meq/L (ref 3.7–5.3)
SODIUM: 140 meq/L (ref 137–147)
TOTAL PROTEIN: 7.1 g/dL (ref 6.0–8.3)
Total Bilirubin: 1.1 mg/dL (ref 0.3–1.2)

## 2014-01-23 LAB — LIPASE, BLOOD: Lipase: 199 U/L — ABNORMAL HIGH (ref 11–59)

## 2014-01-23 MED ORDER — HYDROXYZINE HCL 25 MG PO TABS: ORAL_TABLET | ORAL | Status: DC

## 2014-01-23 MED ORDER — LOPERAMIDE HCL 2 MG PO CAPS: 2.0000 mg | ORAL_CAPSULE | ORAL | Status: DC | PRN

## 2014-01-23 MED ORDER — OXYCODONE HCL 5 MG PO TABS: 10.0000 mg | ORAL_TABLET | Freq: Four times a day (QID) | ORAL | Status: DC | PRN

## 2014-01-23 MED ORDER — SODIUM CHLORIDE 0.9 % IV BOLUS (SEPSIS)
500.0000 mL | Freq: Once | INTRAVENOUS | Status: AC
  Administered 2014-01-23: 500 mL via INTRAVENOUS

## 2014-01-23 MED ORDER — BOOST / RESOURCE BREEZE PO LIQD
1.0000 | Freq: Three times a day (TID) | ORAL | Status: DC
  Administered 2014-01-23: 12:00:00 via ORAL

## 2014-01-23 NOTE — Progress Notes (Signed)
Patient discharge teaching given, including activity, diet, follow-up appoints, and medications. Patient verbalized understanding of all discharge instructions. IV access was d/c'd. Vitals are stable. Skin is intact except as charted in most recent assessments. Pt to be escorted d/c lounge, to be driven home by girlfriend.

## 2014-01-23 NOTE — Discharge Instructions (Signed)
No Alcohol.   No fatty foods.

## 2014-01-23 NOTE — Progress Notes (Signed)
INITIAL NUTRITION ASSESSMENT  DOCUMENTATION CODES Per approved criteria  -Severe malnutrition in the context of chronic illness  Pt meets criteria for severe MALNUTRITION in the context of chronic illness as evidenced by severe muscle and subcutaneous fat depletion and poor PO intake <75% x 1 month.  INTERVENTION: Provide Resource Breeze po TID, each supplement provides 250 kcal and 9 grams of protein Encourage PO intake Will continue to monitor  NUTRITION DIAGNOSIS: Increased nutrient (protein) needs related to ETOH abuse, abdominal pain as evidenced by Poor PO intake <25% x 1 week, not consuming enough food to meet estimated needs.   Goal: Pt to meet >/= 90% of their estimated nutrition needs   Monitor:  PO and supplemental intake, weight, labs, I/O's   Reason for Assessment: Pt identified as at nutrition risk on the Malnutrition Screen Tool   Admitting Dx: Pancreatitis, alcoholic, acute  ASSESSMENT: 43 y/o ?, history chronic pancreatitis, pseudocyst, prior substance abuse, history suicidal attempt, history hiatal hernia, reflux, Wolff-Parkinson-White syndrome multiple frequent admissions to hospital for acute pancreatitis flares secondary to alcohol use. States that when he went home after last discharge on 01/15/14 he continued to have chronic diarrhea stools "15 per day". He also states that his pain was not completely resolved. He was able however interestingly to tolerate pizza and chicken as of yesterday although prior to this he was not able to eat anything.  Pt reports 15 lb wt loss x 1.5 months. Weight history shows that pt was 128 lb on 8/20 but has recently gained weight since, current wt is 140 lb. Despite no recent weight loss within the month, pt is at nutritional risk d/t ETOH abuse and Hx of pancreatitis. Per documentation, pt drinks 12 pack of beer daily.  Pt reports not tolerating FL tray this morning, PO intake 100% but pt states that he is still feeling pain when  eating and drinking. However, he says pain is improving.   Pt recently admitted on 9/3, since returning home, pt has continued to drink alcohol, continued to have diarrhea and pain, was only eating bland foods and broth. Pt reports being able to eat only once a day. Per previous nutritional assessment on 8/20, pt had poor PO for >1 month. Provided pt with brief diet education.  Nutrition Focused Physical Exam:  Subcutaneous Fat:  Orbital Region: WNL Upper Arm Region: severe depletion Thoracic and Lumbar Region: NA  Muscle:  Temple Region: mild depletion Clavicle Bone Region: moderate depletion Clavicle and Acromion Bone Region: moderate depletion Scapular Bone Region: mild/moderate depletion Dorsal Hand: WNL Patellar Region: WNL Anterior Thigh Region: severe depletion Posterior Calf Region: moderate depletion  Edema: No LE edema   Labs reviewed: Low BUN  Height: Ht Readings from Last 1 Encounters:  01/22/14 5\' 9"  (1.753 m)    Weight: Wt Readings from Last 1 Encounters:  01/09/14 140 lb (63.504 kg)    Ideal Body Weight: 160 lb  % Ideal Body Weight: 88%  Wt Readings from Last 10 Encounters:  01/09/14 140 lb (63.504 kg)  01/06/14 140 lb (63.504 kg)  12/26/13 128 lb 4.9 oz (58.2 kg)  12/16/13 137 lb (62.143 kg)  12/11/13 137 lb (62.143 kg)  12/09/13 137 lb (62.143 kg)  11/28/13 132 lb (59.875 kg)  11/17/13 137 lb (62.143 kg)  10/22/13 126 lb (57.153 kg)  10/06/13 130 lb 8.2 oz (59.2 kg)    Usual Body Weight: 147 lb  % Usual Body Weight: 95%  BMI:  20.7  Estimated Nutritional Needs: Kcal:  1900-2000 Protein: 95-105g Fluid: 1.9 L/day  Skin: intact  Diet Order: Full Liquid  EDUCATION NEEDS: -No education needs identified at this time   Intake/Output Summary (Last 24 hours) at 01/23/14 1040 Last data filed at 01/23/14 0900  Gross per 24 hour  Intake    462 ml  Output      0 ml  Net    462 ml    Last BM: 9/17  Labs:   Recent Labs Lab  01/22/14 0600 01/22/14 1150 01/23/14 0705  NA 134*  --  140  K 3.8  --  4.3  CL 95*  --  103  CO2 20  --  24  BUN 6  --  3*  CREATININE 0.61 0.61 0.56  CALCIUM 9.4  --  9.0  GLUCOSE 109*  --  85    CBG (last 3)  No results found for this basename: GLUCAP,  in the last 72 hours  Scheduled Meds: . antiseptic oral rinse  7 mL Mouth Rinse q12n4p  . chlorhexidine  15 mL Mouth Rinse BID  . folic acid  1 mg Oral Daily  . heparin  5,000 Units Subcutaneous 3 times per day  . lipase/protease/amylase  24,000 Units Oral TID AC  . metoprolol  50 mg Oral BID  . multivitamin with minerals  1 tablet Oral Daily  . pantoprazole  40 mg Oral BID  . QUEtiapine  100 mg Oral QHS  . sertraline  75 mg Oral Daily  . sodium chloride  500 mL Intravenous Once  . sodium chloride  3 mL Intravenous Q12H  . thiamine  100 mg Oral Daily    Continuous Infusions:   Past Medical History  Diagnosis Date  . Hypertension   . Asthma   . Pancreatitis   . Cocaine abuse   . Depression   . H/O suicide attempt   . Heart murmur     "when he was little" (03/06/2013)  . Shortness of breath     "can happen at anytime" (03/06/2013)  . Anemia   . H/O hiatal hernia   . GERD (gastroesophageal reflux disease)   . Arthritis     "knees" (03/06/2013)  . Anxiety   . WPW (Wolff-Parkinson-White syndrome)     [name] 03/06/2013    Past Surgical History  Procedure Laterality Date  . Facial fracture surgery Left 1990's    "result of trauma" (03/06/2013)  . Eye surgery Left 1990's    "result of trauma" (03/06/2013)    Clayton Bibles, [place], [place] Licensed Dietitian Nutritionist Pager: [phone]

## 2014-01-23 NOTE — ED Provider Notes (Signed)
Medical screening examination/treatment/procedure(s) were performed by non-physician practitioner and as supervising physician I was immediately available for consultation/collaboration.   EKG Interpretation None       Varney Biles, MD 01/23/14 0148

## 2014-01-23 NOTE — Discharge Summary (Signed)
Physician Discharge Summary  [name] UXN:[id number] DOB: 07-21-1970 DOA: 01/22/2014  PCP: Angelica Chessman, MD  Admit date: 01/22/2014 Discharge date: 01/23/2014  Time spent: 40 minutes  Recommendations for Outpatient Follow-up:  1. Polysubstance abuse cessation. 2. Low fat diet  3.   bmet at follow up.  Discharge Diagnoses:  Principal Problem:   Pancreatitis, alcoholic, acute Active Problems:   ALCOHOL ABUSE   TOBACCO ABUSE   DEPRESSION   ALLERGIC RHINITIS   ASTHMA   HIATAL HERNIA WITH REFLUX   [name] Parkinson White pattern seen on electrocardiogram   Protein-calorie malnutrition, severe   Pancreatitis   Discharge Condition: stable.  Pain improved.  No nausea or vomiting had the discharge.  Diet recommendation: slowly advance from liquids to small portions of low fat solids.  History of present illness:  Patient with history of chronic pancreatitis, pseudocyst, substance abuse and WPW who has had frequent admissions for abdominal pain presented to the ER on 9/16 with diarrhea and severe abdominal pain.  After discharge on 9/9 patient drank beer and ate pizza and fried chicken.  Lipase on admission was mildly elevated.  Hospital Course:   Pancreatitis flare H/O chronic pancreatitis with pseudocyst. Secondary to alcohol and fatty foods.  Patient reports to me that he knows not to drink alcohol and to eat a low fat diet. Was placed on clear liquids, IVF, and treated supportively with pain medications. Lipase trending down. Abdomen the pain was much improved the day of discharge, no nausea or vomiting, advanced to regular diet by lunch which he tolerated very well, and was discharged with instruction to avoid alcohol and fatty meal, and to drink plenty of fluids and keep hydrated . Patient requested pain medication prescription.  He was given a very limited prescription for 5 tabs as his PCP appt is tomorrow. On creon.  Barbituate and Alcohol abuse UDS positive for  barbituates.  Patient counseled on cessation. Patient reports that he knows not to drink alcohol. He has been given resources to support his efforts to stop drinking multiple times by social work on previous recent admits.  Diarrhea Likely due to alcohol use and creon C-diff PCR negative. Recommend imodium PRN.  Depression  Seroquel continued.  WPW  Quiet and stable this admit.  Continued on metoprolol.  Protein-calorie malnutrition, severe      Procedures:  none  Consultations:  none  Discharge Exam: Filed Vitals:   01/23/14 1320  BP: 138/88  Pulse: 83  Temp: 98 F (36.7 C)  Resp: 18    General:  Alert, orientated, Calm, appears comfortable in bed. CV:  Rrr, no m/r/g Resp:  CTA no w/c/r Abdomen:  Thin, soft, nt, nd, +bs Extremities:  No swelling or cyanosis, ambulates well.  Discharge Instructions  Discharge Instructions   Diet - low sodium heart healthy    Complete by:  As directed      Increase activity slowly    Complete by:  As directed           Discharge Medication List as of 01/23/2014  3:34 PM    START taking these medications   Details  hydrOXYzine (ATARAX/VISTARIL) 25 MG tablet Take 1 tablet as needed up to every 6 hours for anxiety, Print    loperamide (IMODIUM) 2 MG capsule Take 1-2 capsules (2-4 mg total) by mouth as needed for diarrhea or loose stools., Starting 01/23/2014, Until Discontinued, No Print      CONTINUE these medications which have CHANGED   Details  oxyCODONE (  OXY IR/ROXICODONE) 5 MG immediate release tablet Take 2-3 tablets (10-15 mg total) by mouth every 6 (six) hours as needed for severe pain., Starting 01/23/2014, Until Discontinued, Print      CONTINUE these medications which have NOT CHANGED   Details  albuterol (PROVENTIL HFA;VENTOLIN HFA) 108 (90 BASE) MCG/ACT inhaler Inhale 1-2 puffs into the lungs every 6 (six) hours as needed for wheezing or shortness of breath., Starting 11/26/2013, Until Discontinued, Print     Alum & Mag Hydroxide-Simeth (GI COCKTAIL) SUSP suspension Take 30 mLs by mouth 3 (three) times daily as needed for indigestion. Shake well., Starting 01/15/2014, Until Discontinued, Print    feeding supplement, ENSURE COMPLETE, (ENSURE COMPLETE) LIQD Take 237 mLs by mouth 2 (two) times daily between meals., Starting 01/15/2014, Until Discontinued, Print    folic acid (FOLVITE) 1 MG tablet Take 1 tablet (1 mg total) by mouth daily., Starting 11/26/2013, Until Discontinued, Print    lipase/protease/amylase (CREON) 12000 UNITS CPEP capsule Take 2 capsules (24,000 Units total) by mouth 3 (three) times daily before meals., Starting 01/15/2014, Until Discontinued, Print    metoprolol (LOPRESSOR) 50 MG tablet Take 1 tablet (50 mg total) by mouth 2 (two) times daily. For high blood pressure, Starting 11/28/2013, Until Discontinued, Normal    Multiple Vitamin (MULTIVITAMIN WITH MINERALS) TABS tablet Take 1 tablet by mouth daily. For low vitamin, Starting 11/28/2013, Until Discontinued, Normal    ondansetron (ZOFRAN) 4 MG tablet Take 4 mg by mouth every 6 (six) hours as needed for nausea or vomiting., Until Discontinued, Historical Med    pantoprazole (PROTONIX) 40 MG tablet Take 1 tablet (40 mg total) by mouth 2 (two) times daily., Starting 11/28/2013, Until Discontinued, Normal    QUEtiapine (SEROQUEL) 100 MG tablet Take 1 tablet (100 mg total) by mouth at bedtime. For mood control, Starting 01/15/2014, Until Discontinued, Print    sertraline (ZOLOFT) 25 MG tablet Take 3 tablets (75 mg total) by mouth daily. For depression, Starting 11/28/2013, Until Discontinued, Normal    thiamine 100 MG tablet Take 1 tablet (100 mg total) by mouth daily., Starting 11/28/2013, Until Discontinued, Normal       Allergies  Allergen Reactions  . Trazodone And Nefazodone Other (See Comments)    Muscle spasms  . Shellfish-Derived Products Nausea And Vomiting   Follow-up Information   Follow up with [facility]     On 01/24/2014. (9 am for hospital follow up)    Contact information:   [place] [phone]       The results of significant diagnostics from this hospitalization (including imaging, microbiology, ancillary and laboratory) are listed below for reference.    Significant Diagnostic Studies:  Labs: Basic Metabolic Panel:  Recent Labs Lab 01/22/14 0600 01/22/14 1150 01/23/14 0705  NA 134*  --  140  K 3.8  --  4.3  CL 95*  --  103  CO2 20  --  24  GLUCOSE 109*  --  85  BUN 6  --  3*  CREATININE 0.61 0.61 0.56  CALCIUM 9.4  --  9.0   Liver Function Tests:  Recent Labs Lab 01/22/14 0600 01/23/14 0705  AST 45* 52*  ALT 28 32  ALKPHOS 121* 112  BILITOT 1.0 1.1  PROT 8.1 7.1  ALBUMIN 3.7 3.4*    Recent Labs Lab 01/22/14 0600 01/23/14 0705  LIPASE 269* 199*   CBC:  Recent Labs Lab 01/22/14 0600 01/22/14 1150 01/23/14 0705  WBC 6.5 8.7 8.4  NEUTROABS 2.8  --   --   HGB 13.7 11.8* 11.1*  HCT 38.9* 34.2* 32.7*  MCV 89.0 88.1 89.6  PLT 263 249 216     Signed:  Karen Kitchens [phone]  [facility] 01/23/2014, 4:39 PM  Attending  Patient was seen, examined,treatment plan was discussed with the Physician extender. I have directly reviewed the clinical findings, lab, imaging studies and management of this patient in detail. I have made the necessary changes to the above noted documentation, and agree with the documentation, as recorded by the Physician extender.  Phillips Climes  MD  [facility].

## 2014-01-26 ENCOUNTER — Encounter (HOSPITAL_COMMUNITY): Payer: Self-pay | Admitting: Emergency Medicine

## 2014-01-26 ENCOUNTER — Emergency Department (HOSPITAL_COMMUNITY)
Admission: EM | Admit: 2014-01-26 | Discharge: 2014-01-26 | Disposition: A | Discharge status: Home / Self Care | Payer: Self-pay | Attending: Emergency Medicine | Admitting: Emergency Medicine

## 2014-01-26 DIAGNOSIS — R112 Nausea with vomiting, unspecified: Secondary | ICD-10-CM | POA: Insufficient documentation

## 2014-01-26 DIAGNOSIS — R1013 Epigastric pain: Secondary | ICD-10-CM | POA: Insufficient documentation

## 2014-01-26 DIAGNOSIS — F411 Generalized anxiety disorder: Secondary | ICD-10-CM | POA: Insufficient documentation

## 2014-01-26 DIAGNOSIS — D649 Anemia, unspecified: Secondary | ICD-10-CM | POA: Insufficient documentation

## 2014-01-26 DIAGNOSIS — F329 Major depressive disorder, single episode, unspecified: Secondary | ICD-10-CM | POA: Insufficient documentation

## 2014-01-26 DIAGNOSIS — I10 Essential (primary) hypertension: Secondary | ICD-10-CM | POA: Insufficient documentation

## 2014-01-26 DIAGNOSIS — R110 Nausea: Secondary | ICD-10-CM

## 2014-01-26 DIAGNOSIS — R197 Diarrhea, unspecified: Secondary | ICD-10-CM | POA: Insufficient documentation

## 2014-01-26 DIAGNOSIS — J45909 Unspecified asthma, uncomplicated: Secondary | ICD-10-CM | POA: Insufficient documentation

## 2014-01-26 DIAGNOSIS — F3289 Other specified depressive episodes: Secondary | ICD-10-CM | POA: Insufficient documentation

## 2014-01-26 DIAGNOSIS — R011 Cardiac murmur, unspecified: Secondary | ICD-10-CM | POA: Insufficient documentation

## 2014-01-26 DIAGNOSIS — M1710 Unilateral primary osteoarthritis, unspecified knee: Secondary | ICD-10-CM | POA: Insufficient documentation

## 2014-01-26 DIAGNOSIS — K859 Acute pancreatitis without necrosis or infection, unspecified: Secondary | ICD-10-CM | POA: Insufficient documentation

## 2014-01-26 DIAGNOSIS — F17200 Nicotine dependence, unspecified, uncomplicated: Secondary | ICD-10-CM | POA: Insufficient documentation

## 2014-01-26 DIAGNOSIS — K219 Gastro-esophageal reflux disease without esophagitis: Secondary | ICD-10-CM | POA: Insufficient documentation

## 2014-01-26 DIAGNOSIS — Z79899 Other long term (current) drug therapy: Secondary | ICD-10-CM | POA: Insufficient documentation

## 2014-01-26 DIAGNOSIS — IMO0002 Reserved for concepts with insufficient information to code with codable children: Secondary | ICD-10-CM

## 2014-01-26 LAB — COMPREHENSIVE METABOLIC PANEL
ALT: 36 U/L (ref 0–53)
ANION GAP: 21 — AB (ref 5–15)
AST: 38 U/L — ABNORMAL HIGH (ref 0–37)
Albumin: 4.3 g/dL (ref 3.5–5.2)
Alkaline Phosphatase: 147 U/L — ABNORMAL HIGH (ref 39–117)
BUN: 10 mg/dL (ref 6–23)
CALCIUM: 9.5 mg/dL (ref 8.4–10.5)
CO2: 19 mEq/L (ref 19–32)
CREATININE: 0.64 mg/dL (ref 0.50–1.35)
Chloride: 97 mEq/L (ref 96–112)
GLUCOSE: 139 mg/dL — AB (ref 70–99)
Potassium: 3.4 mEq/L — ABNORMAL LOW (ref 3.7–5.3)
Sodium: 137 mEq/L (ref 137–147)
Total Bilirubin: 0.8 mg/dL (ref 0.3–1.2)
Total Protein: 9 g/dL — ABNORMAL HIGH (ref 6.0–8.3)

## 2014-01-26 LAB — CBC WITH DIFFERENTIAL/PLATELET
BASOS ABS: 0 10*3/uL (ref 0.0–0.1)
Basophils Relative: 0 % (ref 0–1)
EOS PCT: 2 % (ref 0–5)
Eosinophils Absolute: 0.3 10*3/uL (ref 0.0–0.7)
HCT: 38.5 % — ABNORMAL LOW (ref 39.0–52.0)
Hemoglobin: 13.5 g/dL (ref 13.0–17.0)
LYMPHS PCT: 25 % (ref 12–46)
Lymphs Abs: 2.9 10*3/uL (ref 0.7–4.0)
MCH: 31.6 pg (ref 26.0–34.0)
MCHC: 35.1 g/dL (ref 30.0–36.0)
MCV: 90.2 fL (ref 78.0–100.0)
Monocytes Absolute: 0.7 10*3/uL (ref 0.1–1.0)
Monocytes Relative: 6 % (ref 3–12)
NEUTROS ABS: 7.7 10*3/uL (ref 1.7–7.7)
Neutrophils Relative %: 67 % (ref 43–77)
Platelets: 212 10*3/uL (ref 150–400)
RBC: 4.27 MIL/uL (ref 4.22–5.81)
RDW: 16.5 % — AB (ref 11.5–15.5)
WBC: 11.5 10*3/uL — AB (ref 4.0–10.5)

## 2014-01-26 LAB — I-STAT TROPONIN, ED: Troponin i, poc: 0 ng/mL (ref 0.00–0.08)

## 2014-01-26 LAB — LIPASE, BLOOD: LIPASE: 123 U/L — AB (ref 11–59)

## 2014-01-26 MED ORDER — OXYCODONE-ACETAMINOPHEN 5-325 MG PO TABS: 1.0000 | ORAL_TABLET | ORAL | Status: DC | PRN

## 2014-01-26 MED ORDER — SODIUM CHLORIDE 0.9 % IV BOLUS (SEPSIS)
1000.0000 mL | Freq: Once | INTRAVENOUS | Status: AC
  Administered 2014-01-26: 1000 mL via INTRAVENOUS

## 2014-01-26 MED ORDER — ONDANSETRON HCL 4 MG/2ML IJ SOLN
4.0000 mg | Freq: Once | INTRAMUSCULAR | Status: AC
  Administered 2014-01-26: 4 mg via INTRAVENOUS
  Filled 2014-01-26: qty 2

## 2014-01-26 MED ORDER — HYDROMORPHONE HCL 1 MG/ML IJ SOLN
1.0000 mg | Freq: Once | INTRAMUSCULAR | Status: AC
  Administered 2014-01-26: 1 mg via INTRAVENOUS
  Filled 2014-01-26: qty 1

## 2014-01-26 MED ORDER — MORPHINE SULFATE 4 MG/ML IJ SOLN
4.0000 mg | Freq: Once | INTRAMUSCULAR | Status: AC
  Administered 2014-01-26: 4 mg via INTRAVENOUS
  Filled 2014-01-26: qty 1

## 2014-01-26 MED ORDER — PANTOPRAZOLE SODIUM 40 MG IV SOLR
40.0000 mg | Freq: Once | INTRAVENOUS | Status: AC
  Administered 2014-01-26: 40 mg via INTRAVENOUS
  Filled 2014-01-26: qty 40

## 2014-01-26 MED ORDER — PROMETHAZINE HCL 25 MG PO TABS: 25.0000 mg | ORAL_TABLET | Freq: Four times a day (QID) | ORAL | Status: DC | PRN

## 2014-01-26 NOTE — ED Notes (Signed)
Per EMS, pt was seen and treated here last Tuesday/Wednesday for Pancreatitis. Pt given medications to use at home with no relief. Pt has been experiencing vomiting and diarrhea at home. HR elevated with EMS at 146. NAD noted on arrival.

## 2014-01-26 NOTE — ED Provider Notes (Signed)
CSN: [id number]     Arrival date & time 01/26/14  0745 History   First MD Initiated Contact with Patient 01/26/14 [phone]     Chief Complaint  Patient presents with  . Abdominal Pain     (Consider location/radiation/quality/duration/timing/severity/associated sxs/prior Treatment) HPI Comments: Patient is a 43 year old male with a past medical history of pancreatitis, GERD, WPW, asthma, chronic abdominal pain and hypertension who presents with abdominal pain that started a few hours ago. The pain is located in his epigastrium and does not radiate. The pain is described as aching and severe. Patient reports being admitted last week for pancreatitis. The pain started gradually and progressively worsened since the onset. No alleviating/aggravating factors. The patient has tried nothing for symptoms without relief. Associated symptoms include nausea and 4 episodes of vomiting. Patient denies fever, headache, diarrhea, chest pain, SOB, dysuria, constipation.   Past Medical History  Diagnosis Date  . Hypertension   . Asthma   . Pancreatitis   . Cocaine abuse   . Depression   . H/O suicide attempt   . Heart murmur     "when he was little" (03/06/2013)  . Shortness of breath     "can happen at anytime" (03/06/2013)  . Anemia   . H/O hiatal hernia   . GERD (gastroesophageal reflux disease)   . Arthritis     "knees" (03/06/2013)  . Anxiety   . WPW (Wolff-Parkinson-White syndrome)     [name] 03/06/2013   Past Surgical History  Procedure Laterality Date  . Facial fracture surgery Left 1990's    "result of trauma" (03/06/2013)  . Eye surgery Left 1990's    "result of trauma" (03/06/2013)   Family History  Problem Relation Age of Onset  . Hypertension Other   . Coronary artery disease Other    History  Substance Use Topics  . Smoking status: Current Every Day Smoker -- 0.50 packs/day for 30 years    Types: Cigarettes  . Smokeless tobacco: Current User  . Alcohol Use: Yes   Comment: 2 beers per day     Review of Systems  Constitutional: Negative for fever, chills and fatigue.  HENT: Negative for trouble swallowing.   Eyes: Negative for visual disturbance.  Respiratory: Negative for shortness of breath.   Cardiovascular: Negative for chest pain and palpitations.  Gastrointestinal: Positive for nausea, vomiting, abdominal pain and diarrhea.  Genitourinary: Negative for dysuria and difficulty urinating.  Musculoskeletal: Negative for arthralgias and neck pain.  Skin: Negative for color change.  Neurological: Negative for dizziness and weakness.  Psychiatric/Behavioral: Negative for dysphoric mood.      Allergies  Shellfish-derived products and Trazodone and nefazodone  Home Medications   Prior to Admission medications   Medication Sig Start Date End Date Taking? Authorizing Provider  albuterol (PROVENTIL HFA;VENTOLIN HFA) 108 (90 BASE) MCG/ACT inhaler Inhale 2 puffs into the lungs every 6 (six) hours as needed for wheezing or shortness of breath.   Yes Historical Provider, MD  Alum & Mag Hydroxide-Simeth (GI COCKTAIL) SUSP suspension Take 30 mLs by mouth 3 (three) times daily as needed for indigestion. Shake well. 01/15/14  Yes Shanker Kristeen Mans, MD  folic acid (FOLVITE) 1 MG tablet Take 1 tablet (1 mg total) by mouth daily. 11/26/13  Yes Shanker Kristeen Mans, MD  hydrOXYzine (ATARAX/VISTARIL) 25 MG tablet Take 25 mg by mouth every 6 (six) hours as needed for anxiety.   Yes Historical Provider, MD  lactose free nutrition (BOOST) LIQD Take 237 mLs by mouth  2 (two) times daily.   Yes Historical Provider, MD  lipase/protease/amylase (CREON) 12000 UNITS CPEP capsule Take 2 capsules (24,000 Units total) by mouth 3 (three) times daily before meals. 01/15/14  Yes Shanker Kristeen Mans, MD  loperamide (IMODIUM) 2 MG capsule Take 1-2 capsules (2-4 mg total) by mouth as needed for diarrhea or loose stools. 01/23/14  Yes [name], PA-C  metoprolol (LOPRESSOR) 50 MG tablet  Take 1 tablet (50 mg total) by mouth 2 (two) times daily. For high blood pressure 11/28/13  Yes [name], MD  Multiple Vitamin (MULTIVITAMIN WITH MINERALS) TABS tablet Take 1 tablet by mouth daily. For low vitamin 11/28/13  Yes Olugbemiga E Doreene Burke, MD  ondansetron (ZOFRAN) 4 MG tablet Take 4 mg by mouth every 6 (six) hours as needed for nausea or vomiting.   Yes Historical Provider, MD  oxyCODONE (OXY IR/ROXICODONE) 5 MG immediate release tablet Take 10 mg by mouth every 6 (six) hours as needed for severe pain.   Yes Historical Provider, MD  pantoprazole (PROTONIX) 40 MG tablet Take 1 tablet (40 mg total) by mouth 2 (two) times daily. 11/28/13  Yes [name], MD  QUEtiapine (SEROQUEL) 100 MG tablet Take 1 tablet (100 mg total) by mouth at bedtime. For mood control 01/15/14  Yes Shanker Kristeen Mans, MD  sertraline (ZOLOFT) 25 MG tablet Take 3 tablets (75 mg total) by mouth daily. For depression 11/28/13  Yes Olugbemiga Essie Christine, MD  thiamine 100 MG tablet Take 100 mg by mouth 2 (two) times daily.   Yes Historical Provider, MD   BP 138/103  Temp(Src) 98.9 F (37.2 C) (Oral)  Resp 21  SpO2 100% Physical Exam  Nursing note and vitals reviewed. Constitutional: He is oriented to person, place, and time. He appears well-developed and well-nourished. No distress.  HENT:  Head: Normocephalic and atraumatic.  Eyes: Conjunctivae and EOM are normal. No scleral icterus.  Neck: Normal range of motion.  Cardiovascular: Normal rate and regular rhythm.  Exam reveals no gallop and no friction rub.   No murmur heard. Pulmonary/Chest: Effort normal and breath sounds normal. He has no wheezes. He has no rales. He exhibits no tenderness.  Abdominal: Soft. He exhibits no distension. There is tenderness. There is no rebound.  Epigastric tenderness to palpation. No other focal tenderness or peritoneal signs.   Musculoskeletal: Normal range of motion.  Neurological: He is alert and oriented to person,  place, and time. Coordination normal.  Speech is goal-oriented. Moves limbs without ataxia.   Skin: Skin is warm and dry.  Psychiatric: He has a normal mood and affect. His behavior is normal.    ED Course  Procedures (including critical care time) Labs Review Labs Reviewed  COMPREHENSIVE METABOLIC PANEL - Abnormal; Notable for the following:    Potassium 3.4 (*)    Glucose, Bld 139 (*)    Total Protein 9.0 (*)    AST 38 (*)    Alkaline Phosphatase 147 (*)    Anion gap 21 (*)    All other components within normal limits  LIPASE, BLOOD - Abnormal; Notable for the following:    Lipase 123 (*)    All other components within normal limits  CBC WITH DIFFERENTIAL - Abnormal; Notable for the following:    WBC 11.5 (*)    HCT 38.5 (*)    RDW 16.5 (*)    All other components within normal limits  I-STAT TROPOININ, ED    Imaging Review No results found.  EKG Interpretation   Date/Time:  Sunday January 26 2014 07:48:19 EDT Ventricular Rate:  127 PR Interval:  105 QRS Duration: 71 QT Interval:  331 QTC Calculation: 481 R Axis:   78 Text Interpretation:  Sinus tachycardia LAE, consider biatrial enlargement  Borderline repolarization abnormality Borderline prolonged QT interval  Non-specific ST-t changes No significant change since last tracing  Confirmed by Ashok Cordia  MD, Lennette Bihari ([id number]) on 01/26/2014 8:02:23 AM      MDM   Final diagnoses:  Epigastric pain  Nausea    8:51 AM Labs pending. Patient will have morphine and zofran for symptoms. Vitals stable and patient afebrile.   Patient's labs show mildly elevated lipase, which is lower than lipase level last week. Patient has not vomited since being in the ED. Patient will be discharged with Percocet and Phenergan and instructions to follow up with PCP. Vitals stable and patient afebrile.    Alvina Chou, PA-C 01/26/14 1323

## 2014-01-26 NOTE — Discharge Instructions (Signed)
Take Percocet as needed for pain. Take Phenergan as needed for nausea. Refer to attached documents for more information. Follow up with your doctor for further evaluation and management.

## 2014-01-27 ENCOUNTER — Telehealth: Payer: Self-pay | Admitting: Internal Medicine

## 2014-01-27 NOTE — Telephone Encounter (Signed)
Called pt to schedule hospital f/u appt.

## 2014-01-27 NOTE — Progress Notes (Signed)
Chart and note reviewed. Agree with note.  Janara Klett, RD, LDN, CNSC Pager [phone] After Hours Pager [phone]   

## 2014-01-27 NOTE — ED Provider Notes (Signed)
Medical screening examination/treatment/procedure(s) were conducted as a shared visit with non-physician practitioner(s) and myself.  I personally evaluated the patient during the encounter.   EKG Interpretation   Date/Time:  Sunday January 26 2014 07:48:19 EDT Ventricular Rate:  127 PR Interval:  105 QRS Duration: 71 QT Interval:  331 QTC Calculation: 481 R Axis:   78 Text Interpretation:  Sinus tachycardia LAE, consider biatrial enlargement  Borderline repolarization abnormality Borderline prolonged QT interval  Non-specific ST-t changes No significant change since last tracing  Confirmed by Pakou Rainbow  MD, Lennette Bihari ([id number]) on 01/26/2014 8:02:23 AM      Pt with hx pancreatitis, c/o epigastric pain, moderate, dull, similar to his prior pain. abd soft, mild epigastric tenderness. Labs. Pain rx. Ivf.   Mirna Mires, MD 01/27/14 [phone]

## 2014-01-30 ENCOUNTER — Inpatient Hospital Stay (HOSPITAL_COMMUNITY): Payer: Self-pay

## 2014-01-30 ENCOUNTER — Inpatient Hospital Stay (HOSPITAL_COMMUNITY)
Admission: EM | Admit: 2014-01-30 | Discharge: 2014-02-02 | DRG: 438 | Disposition: A | Discharge status: Home / Self Care | Payer: Self-pay | Attending: Internal Medicine | Admitting: Internal Medicine

## 2014-01-30 ENCOUNTER — Encounter (HOSPITAL_COMMUNITY): Payer: Self-pay | Admitting: Emergency Medicine

## 2014-01-30 DIAGNOSIS — E43 Unspecified severe protein-calorie malnutrition: Secondary | ICD-10-CM | POA: Diagnosis present

## 2014-01-30 DIAGNOSIS — R109 Unspecified abdominal pain: Secondary | ICD-10-CM

## 2014-01-30 DIAGNOSIS — M6740 Ganglion, unspecified site: Secondary | ICD-10-CM

## 2014-01-30 DIAGNOSIS — K860 Alcohol-induced chronic pancreatitis: Secondary | ICD-10-CM

## 2014-01-30 DIAGNOSIS — R55 Syncope and collapse: Secondary | ICD-10-CM

## 2014-01-30 DIAGNOSIS — Z720 Tobacco use: Secondary | ICD-10-CM | POA: Diagnosis present

## 2014-01-30 DIAGNOSIS — K852 Alcohol induced acute pancreatitis without necrosis or infection: Secondary | ICD-10-CM

## 2014-01-30 DIAGNOSIS — R079 Chest pain, unspecified: Secondary | ICD-10-CM

## 2014-01-30 DIAGNOSIS — F1014 Alcohol abuse with alcohol-induced mood disorder: Secondary | ICD-10-CM

## 2014-01-30 DIAGNOSIS — K863 Pseudocyst of pancreas: Secondary | ICD-10-CM

## 2014-01-30 DIAGNOSIS — F411 Generalized anxiety disorder: Secondary | ICD-10-CM | POA: Diagnosis present

## 2014-01-30 DIAGNOSIS — M1710 Unilateral primary osteoarthritis, unspecified knee: Secondary | ICD-10-CM | POA: Diagnosis present

## 2014-01-30 DIAGNOSIS — F329 Major depressive disorder, single episode, unspecified: Secondary | ICD-10-CM | POA: Diagnosis present

## 2014-01-30 DIAGNOSIS — K862 Cyst of pancreas: Secondary | ICD-10-CM

## 2014-01-30 DIAGNOSIS — K219 Gastro-esophageal reflux disease without esophagitis: Secondary | ICD-10-CM | POA: Diagnosis present

## 2014-01-30 DIAGNOSIS — F17200 Nicotine dependence, unspecified, uncomplicated: Secondary | ICD-10-CM | POA: Diagnosis present

## 2014-01-30 DIAGNOSIS — J309 Allergic rhinitis, unspecified: Secondary | ICD-10-CM

## 2014-01-30 DIAGNOSIS — R1084 Generalized abdominal pain: Secondary | ICD-10-CM

## 2014-01-30 DIAGNOSIS — F332 Major depressive disorder, recurrent severe without psychotic features: Secondary | ICD-10-CM

## 2014-01-30 DIAGNOSIS — F1010 Alcohol abuse, uncomplicated: Secondary | ICD-10-CM | POA: Diagnosis present

## 2014-01-30 DIAGNOSIS — M549 Dorsalgia, unspecified: Secondary | ICD-10-CM

## 2014-01-30 DIAGNOSIS — W57XXXA Bitten or stung by nonvenomous insect and other nonvenomous arthropods, initial encounter: Secondary | ICD-10-CM

## 2014-01-30 DIAGNOSIS — Z8639 Personal history of other endocrine, nutritional and metabolic disease: Secondary | ICD-10-CM

## 2014-01-30 DIAGNOSIS — K859 Acute pancreatitis without necrosis or infection, unspecified: Principal | ICD-10-CM | POA: Diagnosis present

## 2014-01-30 DIAGNOSIS — R112 Nausea with vomiting, unspecified: Secondary | ICD-10-CM

## 2014-01-30 DIAGNOSIS — G43909 Migraine, unspecified, not intractable, without status migrainosus: Secondary | ICD-10-CM | POA: Diagnosis present

## 2014-01-30 DIAGNOSIS — I10 Essential (primary) hypertension: Secondary | ICD-10-CM | POA: Diagnosis present

## 2014-01-30 DIAGNOSIS — IMO0002 Reserved for concepts with insufficient information to code with codable children: Secondary | ICD-10-CM

## 2014-01-30 DIAGNOSIS — K861 Other chronic pancreatitis: Secondary | ICD-10-CM | POA: Diagnosis present

## 2014-01-30 DIAGNOSIS — T1491XA Suicide attempt, initial encounter: Secondary | ICD-10-CM

## 2014-01-30 DIAGNOSIS — I456 Pre-excitation syndrome: Secondary | ICD-10-CM | POA: Diagnosis present

## 2014-01-30 DIAGNOSIS — K089 Disorder of teeth and supporting structures, unspecified: Secondary | ICD-10-CM

## 2014-01-30 DIAGNOSIS — Z862 Personal history of diseases of the blood and blood-forming organs and certain disorders involving the immune mechanism: Secondary | ICD-10-CM

## 2014-01-30 DIAGNOSIS — K449 Diaphragmatic hernia without obstruction or gangrene: Secondary | ICD-10-CM | POA: Diagnosis present

## 2014-01-30 DIAGNOSIS — F32A Depression, unspecified: Secondary | ICD-10-CM

## 2014-01-30 DIAGNOSIS — F3289 Other specified depressive episodes: Secondary | ICD-10-CM | POA: Diagnosis present

## 2014-01-30 DIAGNOSIS — D649 Anemia, unspecified: Secondary | ICD-10-CM | POA: Diagnosis present

## 2014-01-30 DIAGNOSIS — G9340 Encephalopathy, unspecified: Secondary | ICD-10-CM

## 2014-01-30 DIAGNOSIS — J45909 Unspecified asthma, uncomplicated: Secondary | ICD-10-CM | POA: Diagnosis present

## 2014-01-30 DIAGNOSIS — E876 Hypokalemia: Secondary | ICD-10-CM | POA: Diagnosis present

## 2014-01-30 HISTORY — DX: Migraine, unspecified, not intractable, without status migrainosus: G43.909

## 2014-01-30 HISTORY — DX: Pure hypercholesterolemia, unspecified: E78.00

## 2014-01-30 LAB — URINALYSIS, ROUTINE W REFLEX MICROSCOPIC
BILIRUBIN URINE: NEGATIVE
Glucose, UA: NEGATIVE mg/dL
Hgb urine dipstick: NEGATIVE
Ketones, ur: NEGATIVE mg/dL
Leukocytes, UA: NEGATIVE
NITRITE: NEGATIVE
Protein, ur: NEGATIVE mg/dL
SPECIFIC GRAVITY, URINE: 1.015 (ref 1.005–1.030)
UROBILINOGEN UA: 0.2 mg/dL (ref 0.0–1.0)
pH: 6 (ref 5.0–8.0)

## 2014-01-30 LAB — COMPREHENSIVE METABOLIC PANEL WITH GFR
ALT: 60 U/L — ABNORMAL HIGH (ref 0–53)
AST: 131 U/L — ABNORMAL HIGH (ref 0–37)
Albumin: 3.3 g/dL — ABNORMAL LOW (ref 3.5–5.2)
Alkaline Phosphatase: 114 U/L (ref 39–117)
Anion gap: 14 (ref 5–15)
BUN: 8 mg/dL (ref 6–23)
CO2: 25 meq/L (ref 19–32)
Calcium: 8.9 mg/dL (ref 8.4–10.5)
Chloride: 99 meq/L (ref 96–112)
Creatinine, Ser: 0.47 mg/dL — ABNORMAL LOW (ref 0.50–1.35)
GFR calc Af Amer: >90 mL/min
GFR calc non Af Amer: >90 mL/min
Glucose, Bld: 104 mg/dL — ABNORMAL HIGH (ref 70–99)
Potassium: 3.4 meq/L — ABNORMAL LOW (ref 3.7–5.3)
Sodium: 138 meq/L (ref 137–147)
Total Bilirubin: 0.3 mg/dL (ref 0.3–1.2)
Total Protein: 7.1 g/dL (ref 6.0–8.3)

## 2014-01-30 LAB — CBC
HEMATOCRIT: 33.2 % — AB (ref 39.0–52.0)
Hemoglobin: 11.3 g/dL — ABNORMAL LOW (ref 13.0–17.0)
MCH: 31 pg (ref 26.0–34.0)
MCHC: 34 g/dL (ref 30.0–36.0)
MCV: 91.2 fL (ref 78.0–100.0)
Platelets: 260 10*3/uL (ref 150–400)
RBC: 3.64 MIL/uL — ABNORMAL LOW (ref 4.22–5.81)
RDW: 17.2 % — AB (ref 11.5–15.5)
WBC: 9.5 10*3/uL (ref 4.0–10.5)

## 2014-01-30 LAB — FERRITIN: FERRITIN: 116 ng/mL (ref 22–322)

## 2014-01-30 LAB — CBC WITH DIFFERENTIAL/PLATELET
Basophils Absolute: 0 10*3/uL (ref 0.0–0.1)
Basophils Relative: 0 % (ref 0–1)
EOS PCT: 5 % (ref 0–5)
Eosinophils Absolute: 0.3 10*3/uL (ref 0.0–0.7)
HEMATOCRIT: 30 % — AB (ref 39.0–52.0)
Hemoglobin: 10.3 g/dL — ABNORMAL LOW (ref 13.0–17.0)
LYMPHS ABS: 2.1 10*3/uL (ref 0.7–4.0)
Lymphocytes Relative: 30 % (ref 12–46)
MCH: 30.9 pg (ref 26.0–34.0)
MCHC: 34.3 g/dL (ref 30.0–36.0)
MCV: 90.1 fL (ref 78.0–100.0)
MONO ABS: 0.6 10*3/uL (ref 0.1–1.0)
Monocytes Relative: 9 % (ref 3–12)
Neutro Abs: 3.9 10*3/uL (ref 1.7–7.7)
Neutrophils Relative %: 56 % (ref 43–77)
PLATELETS: 231 10*3/uL (ref 150–400)
RBC: 3.33 MIL/uL — AB (ref 4.22–5.81)
RDW: 17.1 % — ABNORMAL HIGH (ref 11.5–15.5)
WBC: 6.9 10*3/uL (ref 4.0–10.5)

## 2014-01-30 LAB — IRON AND TIBC
Iron: 114 ug/dL (ref 42–135)
Saturation Ratios: 34 % (ref 20–55)
TIBC: 331 ug/dL (ref 215–435)
UIBC: 217 ug/dL (ref 125–400)

## 2014-01-30 LAB — LIPID PANEL
CHOL/HDL RATIO: 2.2 ratio
Cholesterol: 164 mg/dL (ref 0–200)
HDL: 74 mg/dL (ref >39)
LDL CALC: 52 mg/dL (ref 0–99)
TRIGLYCERIDES: 190 mg/dL — AB (ref <150)
VLDL: 38 mg/dL (ref 0–40)

## 2014-01-30 LAB — VITAMIN B12: Vitamin B-12: 375 pg/mL (ref 211–911)

## 2014-01-30 LAB — PHOSPHORUS: Phosphorus: 2.8 mg/dL (ref 2.3–4.6)

## 2014-01-30 LAB — RAPID URINE DRUG SCREEN, HOSP PERFORMED
Amphetamines: NOT DETECTED
Barbiturates: POSITIVE — AB
Benzodiazepines: NOT DETECTED
Cocaine: NOT DETECTED
Opiates: NOT DETECTED
Tetrahydrocannabinol: NOT DETECTED

## 2014-01-30 LAB — LIPASE, BLOOD: LIPASE: 129 U/L — AB (ref 11–59)

## 2014-01-30 LAB — ETHANOL: Alcohol, Ethyl (B): <11 mg/dL (ref 0–11)

## 2014-01-30 LAB — CREATININE, SERUM
Creatinine, Ser: 0.48 mg/dL — ABNORMAL LOW (ref 0.50–1.35)
GFR calc non Af Amer: >90 mL/min (ref >90)

## 2014-01-30 LAB — FOLATE

## 2014-01-30 LAB — MAGNESIUM: Magnesium: 1.4 mg/dL — ABNORMAL LOW (ref 1.5–2.5)

## 2014-01-30 MED ORDER — ONDANSETRON HCL 4 MG/2ML IJ SOLN: 4.0000 mg | Freq: Three times a day (TID) | INTRAMUSCULAR | Status: DC | PRN

## 2014-01-30 MED ORDER — INFLUENZA VAC SPLIT QUAD 0.5 ML IM SUSY
0.5000 mL | PREFILLED_SYRINGE | INTRAMUSCULAR | Status: AC
  Administered 2014-01-31: 0.5 mL via INTRAMUSCULAR
  Filled 2014-01-30: qty 0.5

## 2014-01-30 MED ORDER — SODIUM CHLORIDE 0.9 % IV BOLUS (SEPSIS)
1000.0000 mL | Freq: Once | INTRAVENOUS | Status: AC
  Administered 2014-01-30: 1000 mL via INTRAVENOUS

## 2014-01-30 MED ORDER — SERTRALINE HCL 50 MG PO TABS
75.0000 mg | ORAL_TABLET | Freq: Every day | ORAL | Status: DC
  Administered 2014-01-30 – 2014-02-02 (×4): 75 mg via ORAL
  Filled 2014-01-30 (×2): qty 1
  Filled 2014-01-30: qty 2
  Filled 2014-01-30: qty 1

## 2014-01-30 MED ORDER — NICOTINE 21 MG/24HR TD PT24
21.0000 mg | MEDICATED_PATCH | Freq: Every day | TRANSDERMAL | Status: DC
  Administered 2014-01-30 – 2014-02-02 (×4): 21 mg via TRANSDERMAL
  Filled 2014-01-30 (×4): qty 1

## 2014-01-30 MED ORDER — ONDANSETRON HCL 4 MG/2ML IJ SOLN
4.0000 mg | Freq: Once | INTRAMUSCULAR | Status: AC
  Administered 2014-01-30: 4 mg via INTRAVENOUS
  Filled 2014-01-30: qty 2

## 2014-01-30 MED ORDER — FLEET ENEMA 7-19 GM/118ML RE ENEM
1.0000 | ENEMA | Freq: Once | RECTAL | Status: AC | PRN
  Filled 2014-01-30: qty 1

## 2014-01-30 MED ORDER — MAGNESIUM SULFATE 4000MG/100ML IJ SOLN
4.0000 g | Freq: Once | INTRAMUSCULAR | Status: AC
  Administered 2014-01-30: 4 g via INTRAVENOUS
  Filled 2014-01-30: qty 100

## 2014-01-30 MED ORDER — OXYCODONE-ACETAMINOPHEN 5-325 MG PO TABS
1.0000 | ORAL_TABLET | ORAL | Status: DC | PRN
Start: 2014-01-30 — End: 2014-01-31
  Administered 2014-01-31 (×2): 1 via ORAL
  Filled 2014-01-30 (×2): qty 1

## 2014-01-30 MED ORDER — ONDANSETRON HCL 4 MG/2ML IJ SOLN: 4.0000 mg | Freq: Four times a day (QID) | INTRAMUSCULAR | Status: AC | PRN

## 2014-01-30 MED ORDER — HYDROMORPHONE HCL 1 MG/ML IJ SOLN
0.5000 mg | Freq: Once | INTRAMUSCULAR | Status: AC
  Administered 2014-01-30: 0.5 mg via INTRAVENOUS
  Filled 2014-01-30: qty 1

## 2014-01-30 MED ORDER — LORAZEPAM 1 MG PO TABS: 1.0000 mg | ORAL_TABLET | Freq: Four times a day (QID) | ORAL | Status: AC | PRN

## 2014-01-30 MED ORDER — GI COCKTAIL ~~LOC~~
30.0000 mL | Freq: Three times a day (TID) | ORAL | Status: DC | PRN
  Administered 2014-01-30 – 2014-02-02 (×9): 30 mL via ORAL
  Filled 2014-01-30 (×9): qty 30

## 2014-01-30 MED ORDER — PROSIGHT PO TABS
1.0000 | ORAL_TABLET | Freq: Every day | ORAL | Status: DC
  Administered 2014-01-30 – 2014-02-02 (×4): 1 via ORAL
  Filled 2014-01-30 (×4): qty 1

## 2014-01-30 MED ORDER — ACETAMINOPHEN 325 MG PO TABS
650.0000 mg | ORAL_TABLET | Freq: Four times a day (QID) | ORAL | Status: DC | PRN
  Administered 2014-01-30 – 2014-02-01 (×2): 650 mg via ORAL
  Filled 2014-01-30 (×2): qty 2

## 2014-01-30 MED ORDER — THIAMINE HCL 100 MG/ML IJ SOLN
100.0000 mg | Freq: Every day | INTRAMUSCULAR | Status: DC
  Administered 2014-01-30 – 2014-01-31 (×2): 100 mg via INTRAVENOUS
  Filled 2014-01-30: qty 2
  Filled 2014-01-30 (×2): qty 1

## 2014-01-30 MED ORDER — METOPROLOL TARTRATE 1 MG/ML IV SOLN
5.0000 mg | Freq: Three times a day (TID) | INTRAVENOUS | Status: DC
  Administered 2014-01-30 – 2014-02-01 (×6): 5 mg via INTRAVENOUS
  Filled 2014-01-30 (×11): qty 5

## 2014-01-30 MED ORDER — HYDROMORPHONE HCL 1 MG/ML IJ SOLN
0.5000 mg | INTRAMUSCULAR | Status: DC | PRN
  Administered 2014-01-30 (×2): 0.5 mg via INTRAVENOUS
  Filled 2014-01-30 (×2): qty 1

## 2014-01-30 MED ORDER — ENOXAPARIN SODIUM 40 MG/0.4ML ~~LOC~~ SOLN
40.0000 mg | SUBCUTANEOUS | Status: DC
  Administered 2014-01-30 – 2014-02-02 (×4): 40 mg via SUBCUTANEOUS
  Filled 2014-01-30 (×4): qty 0.4

## 2014-01-30 MED ORDER — HYDROMORPHONE HCL 1 MG/ML IJ SOLN
1.0000 mg | Freq: Once | INTRAMUSCULAR | Status: AC
  Administered 2014-01-30: 1 mg via INTRAVENOUS
  Filled 2014-01-30: qty 1

## 2014-01-30 MED ORDER — FOLIC ACID 5 MG/ML IJ SOLN
1.0000 mg | Freq: Every day | INTRAMUSCULAR | Status: DC
  Administered 2014-01-30 – 2014-01-31 (×2): 1 mg via INTRAVENOUS
  Filled 2014-01-30 (×4): qty 0.2

## 2014-01-30 MED ORDER — HYDROMORPHONE HCL 1 MG/ML IJ SOLN
1.0000 mg | INTRAMUSCULAR | Status: AC | PRN
  Filled 2014-01-30: qty 1

## 2014-01-30 MED ORDER — ACETAMINOPHEN 650 MG RE SUPP: 650.0000 mg | Freq: Four times a day (QID) | RECTAL | Status: DC | PRN

## 2014-01-30 MED ORDER — LORAZEPAM 2 MG/ML IJ SOLN
0.0000 mg | Freq: Four times a day (QID) | INTRAMUSCULAR | Status: AC
  Administered 2014-01-30 – 2014-02-01 (×3): 1 mg via INTRAVENOUS
  Filled 2014-01-30: qty 1
  Filled 2014-01-30: qty 2
  Filled 2014-01-30 (×2): qty 1

## 2014-01-30 MED ORDER — HYDROXYZINE HCL 25 MG PO TABS
25.0000 mg | ORAL_TABLET | Freq: Four times a day (QID) | ORAL | Status: DC | PRN
  Administered 2014-02-01: 25 mg via ORAL
  Filled 2014-01-30: qty 1

## 2014-01-30 MED ORDER — POTASSIUM CHLORIDE 2 MEQ/ML IV SOLN
INTRAVENOUS | Status: DC
  Administered 2014-01-30 (×2): via INTRAVENOUS
  Filled 2014-01-30 (×6): qty 1000

## 2014-01-30 MED ORDER — HYDROMORPHONE HCL 1 MG/ML IJ SOLN
1.0000 mg | INTRAMUSCULAR | Status: AC | PRN
  Administered 2014-01-30: 1 mg via INTRAVENOUS

## 2014-01-30 MED ORDER — POLYETHYLENE GLYCOL 3350 17 G PO PACK: 17.0000 g | PACK | Freq: Every day | ORAL | Status: DC | PRN

## 2014-01-30 MED ORDER — ALBUTEROL SULFATE (2.5 MG/3ML) 0.083% IN NEBU: 2.0000 mL | INHALATION_SOLUTION | Freq: Four times a day (QID) | RESPIRATORY_TRACT | Status: DC | PRN

## 2014-01-30 MED ORDER — LORAZEPAM 2 MG/ML IJ SOLN
0.0000 mg | Freq: Two times a day (BID) | INTRAMUSCULAR | Status: DC
  Administered 2014-02-01: 1 mg via INTRAVENOUS
  Filled 2014-01-30: qty 1

## 2014-01-30 MED ORDER — SORBITOL 70 % SOLN
30.0000 mL | Freq: Every day | Status: DC | PRN
  Filled 2014-01-30: qty 30

## 2014-01-30 MED ORDER — PANTOPRAZOLE SODIUM 40 MG IV SOLR
40.0000 mg | Freq: Two times a day (BID) | INTRAVENOUS | Status: DC
  Administered 2014-01-30 – 2014-01-31 (×4): 40 mg via INTRAVENOUS
  Filled 2014-01-30 (×6): qty 40

## 2014-01-30 MED ORDER — LORAZEPAM 2 MG/ML IJ SOLN
1.0000 mg | Freq: Four times a day (QID) | INTRAMUSCULAR | Status: AC | PRN
  Administered 2014-01-31 – 2014-02-02 (×4): 1 mg via INTRAVENOUS
  Filled 2014-01-30 (×2): qty 1

## 2014-01-30 NOTE — H&P (Signed)
[facility] History and Physical  KASHIS PENLEY IBB:[id number] DOB: [birth year] DOA: 01/30/2014  Referring physician: Dr. Reather Converse PCP: Angelica Chessman, MD   Chief Complaint: Abdominal pain  HPI: [name] is a 43 y.o. male  With history of alcohol abuse, polysubstance abuse, hiatal hernia, hypertension, chronic pancreatitis with pseudocyst, anxiety, multiple hospitalizations of 8 admissions over the past 6 months and 12 ED visits over the past 6 months secondary to acute on chronic pancreatitis.  PATIENT WAS RECENTLY ADMITTED 01/22/2014 TO 01/23/2014 FOR ACUTE ON CHRONIC pancreatitis.  Patient presented to the ED with a four-day history of epigastric abdominal pain and left lower quadrant abdominal pain. Patient states abdominal pain started 4 days prior to admission was in the left lower quadrant and epigastrium. Patient states abdominal pain somewhat improved however one day prior to admission he developed follow to 6 episodes of emesis nausea loose stools right lower quadrant pain and burning epigastric abdominal pain. Patient states that 2 days prior to admission drank some alcohol. Patient denies any chest pain, no shortness of breath, no fever, no chills, no weakness, no dysuria, no melena, no hematemesis, no hematochezia, no cough. Patient states drank 240 ounces of beer. Patient presented to the emergency room was noted to have elevated lipase levels. Basic metabolic profile have a potassium of 3.4 AST of 131 ALT of 60. CBC had a hemoglobin of 10.3 otherwise was within normal limits. Urinalysis which was done was negative. Patient was given multiple doses of pain medications in the emergency room however continued to have abdominal pain. Try hospice will call to admit the patient for further evaluation and management.   Review of Systems:  as per history of present illness otherwise negative.  Constitutional:  No weight loss, night sweats, Fevers, chills, fatigue.  HEENT:    No headaches, Difficulty swallowing,Tooth/dental problems,Sore throat,  No sneezing, itching, ear ache, nasal congestion, post nasal drip,  Cardio-vascular:  No chest pain, Orthopnea, PND, swelling in lower extremities, anasarca, dizziness, palpitations  GI:  No heartburn, indigestion, abdominal pain, nausea, vomiting, diarrhea, change in bowel habits, loss of appetite  Resp:  No shortness of breath with exertion or at rest. No excess mucus, no productive cough, No non-productive cough, No coughing up of blood.No change in color of mucus.No wheezing.No chest wall deformity  Skin:  no rash or lesions.  GU:  no dysuria, change in color of urine, no urgency or frequency. No flank pain.  Musculoskeletal:  No joint pain or swelling. No decreased range of motion. No back pain.  Psych:  No change in mood or affect. No depression or anxiety. No memory loss.   Past Medical History  Diagnosis Date  . Hypertension   . Asthma   . Pancreatitis   . Cocaine abuse   . Depression   . H/O suicide attempt   . Heart murmur     "when he was little" (03/06/2013)  . Shortness of breath     "can happen at anytime" (03/06/2013)  . Anemia   . H/O hiatal hernia   . GERD (gastroesophageal reflux disease)   . Arthritis     "knees" (03/06/2013)  . Anxiety   . WPW (Wolff-Parkinson-White syndrome)     [name] 03/06/2013   Past Surgical History  Procedure Laterality Date  . Facial fracture surgery Left 1990's    "result of trauma" (03/06/2013)  . Eye surgery Left 1990's    "result of trauma" (03/06/2013)   Social History:  reports that he has  been smoking Cigarettes.  He has a 15 pack-year smoking history. He uses smokeless tobacco. He reports that he drinks alcohol. He reports that he does not use illicit drugs.  Allergies  Allergen Reactions  . Shellfish-Derived Products Nausea And Vomiting  . Trazodone And Nefazodone Other (See Comments)    Muscle spasms    Family History  Problem Relation  Age of Onset  . Hypertension Other   . Coronary artery disease Other      Prior to Admission medications   Medication Sig Start Date End Date Taking? Authorizing Provider  Alum & Mag Hydroxide-Simeth (GI COCKTAIL) SUSP suspension Take 30 mLs by mouth 3 (three) times daily as needed for indigestion. Shake well. 01/15/14  Yes Shanker Kristeen Mans, MD  folic acid (FOLVITE) 1 MG tablet Take 1 tablet (1 mg total) by mouth daily. 11/26/13  Yes Shanker Kristeen Mans, MD  hydrOXYzine (ATARAX/VISTARIL) 25 MG tablet Take 25 mg by mouth every 6 (six) hours as needed for anxiety.   Yes Historical Provider, MD  lactose free nutrition (BOOST) LIQD Take 237 mLs by mouth 2 (two) times daily.   Yes Historical Provider, MD  lipase/protease/amylase (CREON) 12000 UNITS CPEP capsule Take 2 capsules (24,000 Units total) by mouth 3 (three) times daily before meals. 01/15/14  Yes Shanker Kristeen Mans, MD  loperamide (IMODIUM) 2 MG capsule Take 1-2 capsules (2-4 mg total) by mouth as needed for diarrhea or loose stools. 01/23/14  Yes [name], PA-C  metoprolol (LOPRESSOR) 50 MG tablet Take 1 tablet (50 mg total) by mouth 2 (two) times daily. For high blood pressure 11/28/13  Yes [name], MD  Multiple Vitamin (MULTIVITAMIN WITH MINERALS) TABS tablet Take 1 tablet by mouth daily. For low vitamin 11/28/13  Yes Olugbemiga E Doreene Burke, MD  ondansetron (ZOFRAN) 4 MG tablet Take 4 mg by mouth every 6 (six) hours as needed for nausea or vomiting.   Yes Historical Provider, MD  oxyCODONE-acetaminophen (PERCOCET/ROXICET) 5-325 MG per tablet Take 1-2 tablets by mouth every 4 (four) hours as needed for moderate pain or severe pain. 01/26/14  Yes Kaitlyn Szekalski, PA-C  pantoprazole (PROTONIX) 40 MG tablet Take 1 tablet (40 mg total) by mouth 2 (two) times daily. 11/28/13  Yes [name], MD  promethazine (PHENERGAN) 25 MG tablet Take 1 tablet (25 mg total) by mouth every 6 (six) hours as needed for nausea or vomiting. 01/26/14   Yes Alvina Chou, PA-C  QUEtiapine (SEROQUEL) 100 MG tablet Take 1 tablet (100 mg total) by mouth at bedtime. For mood control 01/15/14  Yes Shanker Kristeen Mans, MD  sertraline (ZOLOFT) 25 MG tablet Take 3 tablets (75 mg total) by mouth daily. For depression 11/28/13  Yes Olugbemiga Essie Christine, MD  thiamine 100 MG tablet Take 100 mg by mouth 2 (two) times daily.   Yes Historical Provider, MD  albuterol (PROVENTIL HFA;VENTOLIN HFA) 108 (90 BASE) MCG/ACT inhaler Inhale 2 puffs into the lungs every 6 (six) hours as needed for wheezing or shortness of breath.    Historical Provider, MD   Physical Exam: Filed Vitals:   01/30/14 0715 01/30/14 0728 01/30/14 0745 01/30/14 0815  BP: 137/76 150/92 128/68 154/93  Pulse: 100 103 105 104  Temp:  98.4 F (36.9 C)    TempSrc:  Oral    Resp: 16 18 17 14   Height:      Weight:      SpO2: 100% 100% 100% 100%    Wt Readings from Last 3 Encounters:  01/30/14 63.504 kg (140 lb)  01/09/14 63.504 kg (140 lb)  01/06/14 63.504 kg (140 lb)    General:   well-developed well-nourished in no acute cardiopulmonary distress speaking in full sentences. Appears calm and comfortable Eyes: PERRLA, EOMI, normal lids, irises & conjunctiva ENT: grossly normal hearing, lips & tongue Neck: no LAD, masses or thyromegaly Cardiovascular: RRR, no m/r/g. No LE edema. Telemetry: SR, no arrhythmias  Respiratory: CTA bilaterally, no w/r/r. Normal respiratory effort. Abdomen: soft, tender to palpation in epigastrium and left lower quadrant, positive bowel sounds, no rebound, no guarding. Skin: no rash or induration seen on limited exam Musculoskeletal: grossly normal tone BUE/BLE Psychiatric: grossly normal mood and affect, speech fluent and appropriate Neurologic:  alert and oriented x3. Cranial nerves II through XII are grossly intact. Sensation is intact. Visual fields are intact. Gait not tested secondary to safety.           Labs on Admission:  Basic Metabolic  Panel:  Recent Labs Lab 01/26/14 0807 01/30/14 0510  NA 137 138  K 3.4* 3.4*  CL 97 99  CO2 19 25  GLUCOSE 139* 104*  BUN 10 8  CREATININE 0.64 0.47*  CALCIUM 9.5 8.9   Liver Function Tests:  Recent Labs Lab 01/26/14 0807 01/30/14 0510  AST 38* 131*  ALT 36 60*  ALKPHOS 147* 114  BILITOT 0.8 0.3  PROT 9.0* 7.1  ALBUMIN 4.3 3.3*    Recent Labs Lab 01/26/14 0807 01/30/14 0510  LIPASE 123* 129*   No results found for this basename: AMMONIA,  in the last 168 hours CBC:  Recent Labs Lab 01/26/14 0807 01/30/14 0510  WBC 11.5* 6.9  NEUTROABS 7.7 3.9  HGB 13.5 10.3*  HCT 38.5* 30.0*  MCV 90.2 90.1  PLT 212 231   Cardiac Enzymes: No results found for this basename: CKTOTAL, CKMB, CKMBINDEX, TROPONINI,  in the last 168 hours  BNP (last 3 results) No results found for this basename: PROBNP,  in the last 8760 hours CBG: No results found for this basename: GLUCAP,  in the last 168 hours  Radiological Exams on Admission: No results found.  EKG: Not done   Assessment/Plan Principal Problem:   Abdominal pain Active Problems:   Pancreatitis, acute   ALCOHOL ABUSE   TOBACCO ABUSE   DEPRESSION   HYPERTENSION   HIATAL HERNIA WITH REFLUX   Protein-calorie malnutrition, severe   MDD (major depressive disorder)   Chronic pancreatitis   Hypokalemia   Anemia   #1 abdominal pain/probable acute on chronic pancreatitis.   patient is present with epigastric abdominal pain with a burning sensation and also left lower quadrant abdominal pain. Patient is noted to have elevated lipase. Patient with recent alcohol consumption prior to onset of his pain. Patient's abdominal pain may be likely secondary to acute on chronic pancreatitis. Patient also with left lower quadrant abdominal pain. Will check abdominal x-rays. Will place patient on bowel rest. Admit to MedSurg bed. Placed on IV fluids. Pain medications. Antiemetics, PPI. Supportive care. Once patient starts to  improve clinically and be started on clear liquids and advance as tolerated. If patient's symptoms worsen may need further abdominal imaging.  #2 hypokalemia Likely secondary to GI losses from emesis. Check a magnesium level. Replete.  #3 history of alcohol abuse Alcohol cessation. We'll place patient on Ativan withdrawal protocol.  #4 hypertension Will place patient on IV Lopressor.  #5 depression Continue home regimen.  #6 anemia No overt bleeding. MCV is normocytic. Check an anemia panel.  Follow H&H.  #7 tobacco abuse Tobacco cessation. Place on a nicotine patch.  #8 prophylaxis PPI for GI prophylaxis. Lovenox for DVT prophylaxis.  Code Status: Full DVT Prophylaxis: Lovenox Family Communication: Updated patient no family at bedside. Disposition Plan: Admit to [facility].  Time spent: [facility] Pager [phone]

## 2014-01-30 NOTE — ED Notes (Signed)
Pt. reports persistent left abdominal pain  for 4 days with nausea , vomitting and diarrhea unrelieved by prescription medications for pancreatitis , pt. was seen here last 01/22/2014 diagnosed with alcohol induced pancreatitis.

## 2014-01-30 NOTE — Discharge Planning (Signed)
[place] to patient regarding primary care resources and establishing care with a provider. [facility] card application and instructions provided. Resource guide and my contact information also given for any future questions or concerns. No other community liaison needs identified at this time.

## 2014-01-30 NOTE — ED Notes (Signed)
Pt resting comfortable when rounding, pt woke up c/o 6/10 abd pain.

## 2014-01-30 NOTE — ED Notes (Signed)
MD at bedside. 

## 2014-01-30 NOTE — ED Provider Notes (Signed)
CSN: [id number]     Arrival date & time 01/30/14  0425 History   First MD Initiated Contact with Patient 01/30/14 [phone]     Chief Complaint  Patient presents with  . Abdominal Pain     (Consider location/radiation/quality/duration/timing/severity/associated sxs/prior Treatment) HPI Comments: 43 year old male with alcohol abuse, substance abuse, asthma, hiatal hernia, malnutrition presents with central/upper and left upper abdominal pain and mild radiation the back or to worsening since Sunday. Patient has had pancreatitis multiple times and this feels overall similar. Patient has vomiting recurrent and diarrhea without bleeding. No bowel surgery history. Pain otitis due to alcohol past. Patient has been trying oral narcotics and nausea no improvement.  Patient is a 43 y.o. male presenting with abdominal pain. The history is provided by the patient.  Abdominal Pain Associated symptoms: diarrhea, nausea and vomiting   Associated symptoms: no chest pain, no chills, no dysuria, no fever and no shortness of breath     Past Medical History  Diagnosis Date  . Hypertension   . Asthma   . Pancreatitis   . Cocaine abuse   . Depression   . H/O suicide attempt   . Heart murmur     "when he was little" (03/06/2013)  . Shortness of breath     "can happen at anytime" (03/06/2013)  . Anemia   . H/O hiatal hernia   . GERD (gastroesophageal reflux disease)   . Arthritis     "knees" (03/06/2013)  . Anxiety   . WPW (Wolff-Parkinson-White syndrome)     [name] 03/06/2013   Past Surgical History  Procedure Laterality Date  . Facial fracture surgery Left 1990's    "result of trauma" (03/06/2013)  . Eye surgery Left 1990's    "result of trauma" (03/06/2013)   Family History  Problem Relation Age of Onset  . Hypertension Other   . Coronary artery disease Other    History  Substance Use Topics  . Smoking status: Current Every Day Smoker -- 0.50 packs/day for 30 years    Types: Cigarettes   . Smokeless tobacco: Current User  . Alcohol Use: Yes     Comment: 2 beers per day     Review of Systems  Constitutional: Positive for appetite change. Negative for fever and chills.  HENT: Negative for congestion.   Eyes: Negative for visual disturbance.  Respiratory: Negative for shortness of breath.   Cardiovascular: Negative for chest pain.  Gastrointestinal: Positive for nausea, vomiting, abdominal pain and diarrhea.  Genitourinary: Negative for dysuria and flank pain.  Musculoskeletal: Negative for back pain, neck pain and neck stiffness.  Skin: Negative for rash.  Neurological: Positive for light-headedness. Negative for headaches.      Allergies  Shellfish-derived products and Trazodone and nefazodone  Home Medications   Prior to Admission medications   Medication Sig Start Date End Date Taking? Authorizing Provider  Alum & Mag Hydroxide-Simeth (GI COCKTAIL) SUSP suspension Take 30 mLs by mouth 3 (three) times daily as needed for indigestion. Shake well. 01/15/14  Yes Shanker Kristeen Mans, MD  folic acid (FOLVITE) 1 MG tablet Take 1 tablet (1 mg total) by mouth daily. 11/26/13  Yes Shanker Kristeen Mans, MD  hydrOXYzine (ATARAX/VISTARIL) 25 MG tablet Take 25 mg by mouth every 6 (six) hours as needed for anxiety.   Yes Historical Provider, MD  lactose free nutrition (BOOST) LIQD Take 237 mLs by mouth 2 (two) times daily.   Yes Historical Provider, MD  lipase/protease/amylase (CREON) 12000 UNITS CPEP capsule Take 2 capsules (  24,000 Units total) by mouth 3 (three) times daily before meals. 01/15/14  Yes Shanker Kristeen Mans, MD  loperamide (IMODIUM) 2 MG capsule Take 1-2 capsules (2-4 mg total) by mouth as needed for diarrhea or loose stools. 01/23/14  Yes [name], PA-C  metoprolol (LOPRESSOR) 50 MG tablet Take 1 tablet (50 mg total) by mouth 2 (two) times daily. For high blood pressure 11/28/13  Yes [name], MD  Multiple Vitamin (MULTIVITAMIN WITH MINERALS) TABS tablet  Take 1 tablet by mouth daily. For low vitamin 11/28/13  Yes Olugbemiga E Doreene Burke, MD  ondansetron (ZOFRAN) 4 MG tablet Take 4 mg by mouth every 6 (six) hours as needed for nausea or vomiting.   Yes Historical Provider, MD  oxyCODONE-acetaminophen (PERCOCET/ROXICET) 5-325 MG per tablet Take 1-2 tablets by mouth every 4 (four) hours as needed for moderate pain or severe pain. 01/26/14  Yes Kaitlyn Szekalski, PA-C  pantoprazole (PROTONIX) 40 MG tablet Take 1 tablet (40 mg total) by mouth 2 (two) times daily. 11/28/13  Yes [name], MD  promethazine (PHENERGAN) 25 MG tablet Take 1 tablet (25 mg total) by mouth every 6 (six) hours as needed for nausea or vomiting. 01/26/14  Yes Alvina Chou, PA-C  QUEtiapine (SEROQUEL) 100 MG tablet Take 1 tablet (100 mg total) by mouth at bedtime. For mood control 01/15/14  Yes Shanker Kristeen Mans, MD  sertraline (ZOLOFT) 25 MG tablet Take 3 tablets (75 mg total) by mouth daily. For depression 11/28/13  Yes Olugbemiga Essie Christine, MD  thiamine 100 MG tablet Take 100 mg by mouth 2 (two) times daily.   Yes Historical Provider, MD  albuterol (PROVENTIL HFA;VENTOLIN HFA) 108 (90 BASE) MCG/ACT inhaler Inhale 2 puffs into the lungs every 6 (six) hours as needed for wheezing or shortness of breath.    Historical Provider, MD   BP 153/92  Pulse 97  Temp(Src) 98 F (36.7 C) (Oral)  Resp 16  Ht 5\' 9"  (1.753 m)  Wt 140 lb (63.504 kg)  BMI 20.67 kg/m2  SpO2 100% Physical Exam  Nursing note and vitals reviewed. Constitutional: He is oriented to person, place, and time. He appears well-developed and well-nourished.  HENT:  Head: Normocephalic and atraumatic.  Mild dry mucous membranes  Eyes: Conjunctivae are normal. Right eye exhibits no discharge. Left eye exhibits no discharge. No scleral icterus.  Neck: Normal range of motion. Neck supple. No tracheal deviation present.  Cardiovascular: Normal rate and regular rhythm.   Pulmonary/Chest: Effort normal and breath  sounds normal.  Abdominal: Soft. He exhibits no distension. There is tenderness (diffuse mild, moderate central and epigastric). There is no guarding.  Musculoskeletal: He exhibits no edema.  Neurological: He is alert and oriented to person, place, and time.  Skin: Skin is warm. No rash noted.  Psychiatric: He has a normal mood and affect.    ED Course  Procedures (including critical care time) Labs Review Labs Reviewed  COMPREHENSIVE METABOLIC PANEL - Abnormal; Notable for the following:    Potassium 3.4 (*)    Glucose, Bld 104 (*)    Creatinine, Ser 0.47 (*)    Albumin 3.3 (*)    AST 131 (*)    ALT 60 (*)    All other components within normal limits  CBC WITH DIFFERENTIAL - Abnormal; Notable for the following:    RBC 3.33 (*)    Hemoglobin 10.3 (*)    HCT 30.0 (*)    RDW 17.1 (*)    All other components  within normal limits  LIPASE, BLOOD - Abnormal; Notable for the following:    Lipase 129 (*)    All other components within normal limits  URINALYSIS, ROUTINE W REFLEX MICROSCOPIC  ETHANOL    Imaging Review No results found.   EKG Interpretation None      MDM   Final diagnoses:  Pancreatitis, alcoholic, acute  Abdominal pain, generalized  Non-intractable vomiting with nausea, vomiting of unspecified type   Patient with recurrent pancreatitis and alcohol abuse history presents with abdominal pain vomiting diarrhea. Discussed differential diagnosis including gastroenteritis, pancreatitis, colitis, other. Pain meds and IV fluids given in ER. Blood work reviewed.  Lipase mild elevated, similar to previous. Patient symptoms only mild improvement in ER requiring multiple doses of IV pain meds, repeat fluid bolus ordered. Plan for admission/observation for pain control and IV fluids especially with patient failing outpatient treatment recently.  Discussed the case with [facility] hospitalist for general medicine admission. Reviewed recent CT scans, I do not feel patient  needs repeat CT scan at this time. The patients results and plan were reviewed and discussed.   Any x-rays performed were personally reviewed by myself.   Differential diagnosis were considered with the presenting HPI.  Medications  HYDROmorphone (DILAUDID) injection 0.5 mg (not administered)  sodium chloride 0.9 % bolus 1,000 mL (not administered)  sodium chloride 0.9 % bolus 1,000 mL (0 mLs Intravenous Stopped 01/30/14 0609)  HYDROmorphone (DILAUDID) injection 1 mg (1 mg Intravenous Given 01/30/14 0528)  ondansetron (ZOFRAN) injection 4 mg (4 mg Intravenous Given 01/30/14 0528)  HYDROmorphone (DILAUDID) injection 1 mg (1 mg Intravenous Given 01/30/14 0605)    Filed Vitals:   01/30/14 0645 01/30/14 0700 01/30/14 0715 01/30/14 0728  BP: 146/89 147/83 137/76 150/92  Pulse: 101 107 100 103  Temp:    98.4 F (36.9 C)  TempSrc:    Oral  Resp: 18 18 16 18   Height:      Weight:      SpO2: 99% 99% 100% 100%    Final diagnoses:  None    Admission/ observation were discussed with the admitting physician, patient and/or family and they are comfortable with the plan.     Mariea Clonts, MD 01/30/14 [phone]

## 2014-01-31 DIAGNOSIS — F1010 Alcohol abuse, uncomplicated: Secondary | ICD-10-CM

## 2014-01-31 DIAGNOSIS — R1084 Generalized abdominal pain: Secondary | ICD-10-CM

## 2014-01-31 LAB — COMPREHENSIVE METABOLIC PANEL
ALT: 41 U/L (ref 0–53)
AST: 47 U/L — AB (ref 0–37)
Albumin: 2.9 g/dL — ABNORMAL LOW (ref 3.5–5.2)
Alkaline Phosphatase: 104 U/L (ref 39–117)
Anion gap: 9 (ref 5–15)
BUN: <3 mg/dL — ABNORMAL LOW (ref 6–23)
CALCIUM: 8.4 mg/dL (ref 8.4–10.5)
CO2: 24 meq/L (ref 19–32)
Chloride: 109 mEq/L (ref 96–112)
Creatinine, Ser: 0.53 mg/dL (ref 0.50–1.35)
Glucose, Bld: 90 mg/dL (ref 70–99)
Potassium: 4.4 mEq/L (ref 3.7–5.3)
Sodium: 142 mEq/L (ref 137–147)
Total Bilirubin: 0.5 mg/dL (ref 0.3–1.2)
Total Protein: 6.2 g/dL (ref 6.0–8.3)

## 2014-01-31 LAB — LIPASE, BLOOD: LIPASE: 86 U/L — AB (ref 11–59)

## 2014-01-31 LAB — CBC
HCT: 32.1 % — ABNORMAL LOW (ref 39.0–52.0)
Hemoglobin: 10.6 g/dL — ABNORMAL LOW (ref 13.0–17.0)
MCH: 30.5 pg (ref 26.0–34.0)
MCHC: 33 g/dL (ref 30.0–36.0)
MCV: 92.2 fL (ref 78.0–100.0)
PLATELETS: 249 10*3/uL (ref 150–400)
RBC: 3.48 MIL/uL — AB (ref 4.22–5.81)
RDW: 17.6 % — ABNORMAL HIGH (ref 11.5–15.5)
WBC: 7.9 10*3/uL (ref 4.0–10.5)

## 2014-01-31 LAB — MAGNESIUM: Magnesium: 1.8 mg/dL (ref 1.5–2.5)

## 2014-01-31 MED ORDER — HYDROMORPHONE HCL 1 MG/ML IJ SOLN
1.0000 mg | INTRAMUSCULAR | Status: DC | PRN
  Administered 2014-01-31 – 2014-02-02 (×13): 1 mg via INTRAVENOUS
  Filled 2014-01-31 (×13): qty 1

## 2014-01-31 MED ORDER — SODIUM CHLORIDE 0.9 % IV SOLN
INTRAVENOUS | Status: DC
  Administered 2014-01-31 – 2014-02-02 (×3): via INTRAVENOUS

## 2014-01-31 MED ORDER — BOOST / RESOURCE BREEZE PO LIQD
1.0000 | Freq: Three times a day (TID) | ORAL | Status: DC
  Administered 2014-01-31 – 2014-02-02 (×6): 1 via ORAL

## 2014-01-31 MED ORDER — MAGNESIUM SULFATE 50 % IJ SOLN
3.0000 g | Freq: Once | INTRAVENOUS | Status: AC
  Administered 2014-01-31: 3 g via INTRAVENOUS
  Filled 2014-01-31: qty 6

## 2014-01-31 NOTE — Progress Notes (Signed)
INITIAL NUTRITION ASSESSMENT  DOCUMENTATION CODES Per approved criteria  -Severe malnutrition in the context of chronic illness  Pt meets criteria for severe MALNUTRITION in the context of chronic illness as evidenced by severe muscle and subcutaneous fat depletion and 9% wt loss x 1 month.  INTERVENTION:  Provide Resource Breeze po TID, each supplement provides 250 kcal and 9 grams of protein  When diet is advanced, provide Ensure Complete po TID, each supplement provides 350 kcal and 13 grams of protein  Encourage PO intake  Will continue to monitor  NUTRITION DIAGNOSIS: Increased nutrient (protein) needs related to alcohol abuse, abdominal pain as evidenced by loss of muscle mass, wt loss of 9% wt loss x 1 month.   Goal: Pt to meet >/= 90% of their estimated nutrition needs   Monitor:  PO and supplemental intake, weight, labs, I/O's  Reason for Assessment: Pt identified as at nutrition risk on the Malnutrition Screen Tool  Admitting Dx: Abdominal pain  ASSESSMENT: 43 y.o. male  With history of alcohol abuse, polysubstance abuse, hiatal hernia, hypertension, chronic pancreatitis with pseudocyst, anxiety, multiple hospitalizations of 8 admissions over the past 6 months and 12 ED visits over the past 6 months secondary to acute on chronic pancreatitis. Patient states abdominal pain started 4 days prior to admission was in the left lower quadrant and epigastrium. Patient states abdominal pain somewhat improved however one day prior to admission he developed follow to 6 episodes of emesis nausea loose stools right lower quadrant pain and burning epigastric abdominal pain. Patient states that 2 days prior to admission drank some alcohol. Patient states drank 240 ounces of beer.  Pt will receive clear liquids beginning at dinner. RD saw pt during last visit (9/17), pt continues to drink alcohol (80 oz of beer per day), Hx of cocaine abuse.  Per weight documentation, pt has lost 12  lbs over the past month (9% wt loss x 1 month). This past week pt reports trying to eat foods like [place] sandwich with mayo, grilled cheese, broth, and baked chicken that was cooked in vegetable oil. Pt states that these foods triggered pain. RD discussed foods that have higher fat contents and how they affect pancreatitis.  Pt would like to receive Resource Breeze while on clears and then when diet is advanced will send Ensure supplements TID.   Nutrition Focused Physical Exam:  Subcutaneous Fat:  Orbital Region: WNL Upper Arm Region: severe depletion Thoracic and Lumbar Region: NA  Muscle:  Temple Region: mild depletion Clavicle Bone Region: moderate depletion Clavicle and Acromion Bone Region: moderate depletion Scapular Bone Region: mild depletion Dorsal Hand: WNL Patellar Region: WNL Anterior Thigh Region: severe depletion Posterior Calf Region: moderate depletion  Edema: no LE edema   Labs reviewed: Low BUN  Height: Ht Readings from Last 1 Encounters:  01/30/14 5\' 9"  (1.753 m)    Weight: Wt Readings from Last 1 Encounters:  01/31/14 128 lb 15.5 oz (58.5 kg)    Ideal Body Weight: 160 lb  % Ideal Body Weight: 80%  Wt Readings from Last 10 Encounters:  01/31/14 128 lb 15.5 oz (58.5 kg)  01/09/14 140 lb (63.504 kg)  01/06/14 140 lb (63.504 kg)  12/26/13 128 lb 4.9 oz (58.2 kg)  12/16/13 137 lb (62.143 kg)  12/11/13 137 lb (62.143 kg)  12/09/13 137 lb (62.143 kg)  11/28/13 132 lb (59.875 kg)  11/17/13 137 lb (62.143 kg)  10/22/13 126 lb (57.153 kg)    Usual Body Weight: 147 lb  %  Usual Body Weight: 87%  BMI:  Body mass index is 19.04 kg/(m^2).  Estimated Nutritional Needs: Kcal: 2000-2100 Protein: 90-100g Fluid: 2 L/day  Skin: intact  Diet Order: NPO  EDUCATION NEEDS: -Education needs addressed   Intake/Output Summary (Last 24 hours) at 01/31/14 1249 Last data filed at 01/31/14 1208  Gross per 24 hour  Intake   52.5 ml  Output   1700 ml   Net -1647.5 ml    Last BM: 9/24  Labs:   Recent Labs Lab 01/26/14 0807 01/30/14 0510 01/30/14 0935 01/30/14 1420 01/31/14 0600  NA 137 138  --   --  142  K 3.4* 3.4*  --   --  4.4  CL 97 99  --   --  109  CO2 19 25  --   --  24  BUN 10 8  --   --  <3*  CREATININE 0.64 0.47*  --  0.48* 0.53  CALCIUM 9.5 8.9  --   --  8.4  MG  --   --  1.4*  --  1.8  PHOS  --   --   --  2.8  --   GLUCOSE 139* 104*  --   --  90    CBG (last 3)  No results found for this basename: GLUCAP,  in the last 72 hours  Scheduled Meds: . enoxaparin (LOVENOX) injection  40 mg Subcutaneous Q24H  . folic acid (FOLVITE) IVPB  1 mg Intravenous Daily  . LORazepam  0-4 mg Intravenous Q6H   Followed by  . [START ON 02/01/2014] LORazepam  0-4 mg Intravenous Q12H  . magnesium sulfate LVP 250-500 ml  3 g Intravenous Once  . metoprolol  5 mg Intravenous 3 times per day  . multivitamin  1 tablet Oral Daily  . nicotine  21 mg Transdermal Daily  . pantoprazole (PROTONIX) IV  40 mg Intravenous Q12H  . sertraline  75 mg Oral Daily  . thiamine IV  100 mg Intravenous Daily    Continuous Infusions: . sodium chloride 125 mL/hr at 01/31/14 1962    Past Medical History  Diagnosis Date  . Hypertension   . Asthma   . Pancreatitis   . Cocaine abuse   . Depression   . H/O suicide attempt   . Heart murmur     "when he was little" (03/06/2013)  . Shortness of breath     "can happen at anytime" (03/06/2013)  . Anemia   . H/O hiatal hernia   . GERD (gastroesophageal reflux disease)   . Anxiety   . WPW (Wolff-Parkinson-White syndrome)     [name] 03/06/2013  . High cholesterol   . Migraine     "monthly" (01/30/2014)  . Arthritis     "knees; arms; elbows" (01/30/2014)    Past Surgical History  Procedure Laterality Date  . Facial fracture surgery Left 1990's    "result of trauma"   . Eye surgery Left 1990's    "result of trauma"     Clayton Bibles, MS, [place], [place] Licensed Dietitian  Nutritionist Pager: [phone]

## 2014-01-31 NOTE — Progress Notes (Signed)
Patient ID: [name]  male  GYI:[id number]    DOB: Nov 05, 1970    DOA: 01/30/2014  PCP: Angelica Chessman, MD  Assessment/Plan: Principal Problem:   Abdominal pain due to acute on chronic pancreatitis: Likely precipitated due to alcohol abuse, states had gone to a birthday party where he drank alcohol CT abdomen and pelvis showed interval improvement in pancreatic edema and changes of pancreatitis, 23 x36mm pseudocyst - Continue n.p.o. status with IV fluid hydration, pain control, PPI - Recheck BMET, lipase in a.m. - Will start clears tonight  Active Problems:   ALCOHOL ABUSE - Continue CIWA with Ativan    TOBACCO ABUSE -Counseled on nicotine cessation     DEPRESSION - Continue home regimen, currently stable    HYPERTENSION - Currently stable, continue IV Lopressor    HIATAL HERNIA WITH REFLUX - Continue IV Protonix, GI cocktail as needed    anemia - Normocytic, continue to monitor counts   DVT Prophylaxis:  Code Status: full code   Family Communication:  Disposition: hopefully tomorrow   Consultants:  None   Procedures:  None   Antibiotics:  None     Subjective: Patient seen and examined, still having epigastric abdominal pain, states GI cocktail helps   Objective: Weight change: -5.443 kg (-12 lb)  Intake/Output Summary (Last 24 hours) at 01/31/14 1216 Last data filed at 01/31/14 1208  Gross per 24 hour  Intake   52.5 ml  Output   1700 ml  Net -1647.5 ml   Blood pressure 140/76, pulse 79, temperature 98.1 F (36.7 C), temperature source Oral, resp. rate 18, height 5\' 9"  (1.753 m), weight 58.5 kg (128 lb 15.5 oz), SpO2 100.00%.  Physical Exam: General: Alert and awake, oriented x3, not in any acute distress. CVS: S1-S2 clear, no murmur rubs or gallops Chest: clear to auscultation bilaterally, no wheezing, rales or rhonchi Abdomen: soft, tenderness in the epigastric region, nondistended, normal bowel sounds  Extremities: no cyanosis,  clubbing or edema noted bilaterally   Lab Results: Basic Metabolic Panel:  Recent Labs Lab 01/30/14 0510  01/30/14 1420 01/31/14 0600  NA 138  --   --  142  K 3.4*  --   --  4.4  CL 99  --   --  109  CO2 25  --   --  24  GLUCOSE 104*  --   --  90  BUN 8  --   --  <3*  CREATININE 0.47*  --  0.48* 0.53  CALCIUM 8.9  --   --  8.4  MG  --   < >  --  1.8  PHOS  --   --  2.8  --   < > = values in this interval not displayed. Liver Function Tests:  Recent Labs Lab 01/30/14 0510 01/31/14 0600  AST 131* 47*  ALT 60* 41  ALKPHOS 114 104  BILITOT 0.3 0.5  PROT 7.1 6.2  ALBUMIN 3.3* 2.9*    Recent Labs Lab 01/30/14 0510 01/31/14 0600  LIPASE 129* 86*   No results found for this basename: AMMONIA,  in the last 168 hours CBC:  Recent Labs Lab 01/30/14 0510 01/30/14 1420 01/31/14 0600  WBC 6.9 9.5 7.9  NEUTROABS 3.9  --   --   HGB 10.3* 11.3* 10.6*  HCT 30.0* 33.2* 32.1*  MCV 90.1 91.2 92.2  PLT 231 260 249   Cardiac Enzymes: No results found for this basename: CKTOTAL, CKMB, CKMBINDEX, TROPONINI,  in the last 168  hours BNP: No components found with this basename: POCBNP,  CBG: No results found for this basename: GLUCAP,  in the last 168 hours   Micro Results: No results found for this or any previous visit (from the past 240 hour(s)).  Studies/Results: Ct Abdomen Pelvis W Contrast  01/09/2014   CLINICAL DATA:  Pancreatitis with pseudocyst.  Abdominal pain  EXAM: CT ABDOMEN AND PELVIS WITH CONTRAST  TECHNIQUE: Multidetector CT imaging of the abdomen and pelvis was performed using the standard protocol following bolus administration of intravenous contrast.  CONTRAST:  39mL OMNIPAQUE IOHEXOL 300 MG/ML  SOLN  COMPARISON:  CT abdomen 12/26/2013  FINDINGS: Lung bases are clear.  Fatty infiltration of the liver without focal liver lesion. Gallbladder is elongated and distended but not thickened. Common bile duct nondilated. Pancreas is small.  Changes of pancreatitis  have improved. There is less peripancreatic edema and pancreatic swelling compared with the prior study. 23 x 12 mm cyst in the uncinate process similar to the prior study compatible with a pseudocyst.  Negative for bowel obstruction. Scattered air-fluid levels may represent mild ileus. No ascites. Normal appendix.  Negative for mass or adenopathy. Prostate mildly enlarged. Mild bladder wall thickening unchanged.  No acute bony change. Disc degeneration in the lumbar spine especially L5-S1.  IMPRESSION: Interval improvement in pancreatic edema and changes of pancreatitis. 23 x 12 mm pseudocyst in the uncinate process similar to the prior study. No ascites.  Fatty liver   Electronically Signed   By: Franchot Gallo M.D.   On: 01/09/2014 13:43   Dg Abd Acute W/chest  01/30/2014   CLINICAL DATA:  Left lower quadrant and epigastric pain. Pancreatitis. Nausea and vomiting.  EXAM: ACUTE ABDOMEN SERIES (ABDOMEN 2 VIEW & CHEST 1 VIEW)  COMPARISON:  CT abdomen and pelvis 01/09/2014. Chest and two views abdomen 12/09/2013.  FINDINGS: Single view of the chest demonstrates clear lungs and normal heart size. No pneumothorax or pleural effusion.  Two views of the abdomen show no free intraperitoneal air. The bowel gas pattern is unremarkable. No abnormal abdominal calcification or focal bony abnormality is identified.  IMPRESSION: Negative examination.   Electronically Signed   By: Inge Rise M.D.   On: 01/30/2014 09:26    Medications: Scheduled Meds: . enoxaparin (LOVENOX) injection  40 mg Subcutaneous Q24H  . folic acid (FOLVITE) IVPB  1 mg Intravenous Daily  . LORazepam  0-4 mg Intravenous Q6H   Followed by  . [START ON 02/01/2014] LORazepam  0-4 mg Intravenous Q12H  . magnesium sulfate LVP 250-500 ml  3 g Intravenous Once  . metoprolol  5 mg Intravenous 3 times per day  . multivitamin  1 tablet Oral Daily  . nicotine  21 mg Transdermal Daily  . pantoprazole (PROTONIX) IV  40 mg Intravenous Q12H  .  sertraline  75 mg Oral Daily  . thiamine IV  100 mg Intravenous Daily      LOS: 1 day   Saif Peter M.D. [facility] 01/31/2014, 12:16 PM Pager: [phone]  If 7PM-7AM, please contact night-coverage [email or web address] Password [email or web address]

## 2014-02-01 DIAGNOSIS — F329 Major depressive disorder, single episode, unspecified: Secondary | ICD-10-CM

## 2014-02-01 DIAGNOSIS — K861 Other chronic pancreatitis: Secondary | ICD-10-CM

## 2014-02-01 DIAGNOSIS — F3289 Other specified depressive episodes: Secondary | ICD-10-CM

## 2014-02-01 LAB — COMPREHENSIVE METABOLIC PANEL
ALBUMIN: 3.2 g/dL — AB (ref 3.5–5.2)
ALK PHOS: 116 U/L (ref 39–117)
ALT: 46 U/L (ref 0–53)
AST: 52 U/L — ABNORMAL HIGH (ref 0–37)
Anion gap: 9 (ref 5–15)
BUN: <3 mg/dL — ABNORMAL LOW (ref 6–23)
CO2: 23 mEq/L (ref 19–32)
Calcium: 8.9 mg/dL (ref 8.4–10.5)
Chloride: 108 mEq/L (ref 96–112)
Creatinine, Ser: 0.5 mg/dL (ref 0.50–1.35)
GFR calc Af Amer: >90 mL/min (ref >90)
GFR calc non Af Amer: >90 mL/min (ref >90)
Glucose, Bld: 96 mg/dL (ref 70–99)
POTASSIUM: 5.2 meq/L (ref 3.7–5.3)
SODIUM: 140 meq/L (ref 137–147)
TOTAL PROTEIN: 6.9 g/dL (ref 6.0–8.3)
Total Bilirubin: 0.4 mg/dL (ref 0.3–1.2)

## 2014-02-01 LAB — CBC
HCT: 34.1 % — ABNORMAL LOW (ref 39.0–52.0)
Hemoglobin: 10.9 g/dL — ABNORMAL LOW (ref 13.0–17.0)
MCH: 31.1 pg (ref 26.0–34.0)
MCHC: 32 g/dL (ref 30.0–36.0)
MCV: 97.4 fL (ref 78.0–100.0)
Platelets: 245 10*3/uL (ref 150–400)
RBC: 3.5 MIL/uL — AB (ref 4.22–5.81)
RDW: 17.7 % — ABNORMAL HIGH (ref 11.5–15.5)
WBC: 6.6 10*3/uL (ref 4.0–10.5)

## 2014-02-01 LAB — LIPASE, BLOOD: Lipase: 80 U/L — ABNORMAL HIGH (ref 11–59)

## 2014-02-01 MED ORDER — QUETIAPINE FUMARATE 100 MG PO TABS
100.0000 mg | ORAL_TABLET | Freq: Every day | ORAL | Status: DC
  Administered 2014-02-01: 100 mg via ORAL
  Filled 2014-02-01 (×3): qty 1

## 2014-02-01 MED ORDER — PROMETHAZINE HCL 25 MG PO TABS
25.0000 mg | ORAL_TABLET | Freq: Four times a day (QID) | ORAL | Status: DC | PRN
  Administered 2014-02-01 – 2014-02-02 (×3): 25 mg via ORAL
  Filled 2014-02-01 (×3): qty 1

## 2014-02-01 MED ORDER — PANCRELIPASE (LIP-PROT-AMYL) 12000-38000 UNITS PO CPEP
12000.0000 [IU] | ORAL_CAPSULE | Freq: Three times a day (TID) | ORAL | Status: DC
  Filled 2014-02-01 (×3): qty 1

## 2014-02-01 MED ORDER — METOPROLOL TARTRATE 50 MG PO TABS
50.0000 mg | ORAL_TABLET | Freq: Two times a day (BID) | ORAL | Status: DC
  Administered 2014-02-01 – 2014-02-02 (×3): 50 mg via ORAL
  Filled 2014-02-01 (×4): qty 1

## 2014-02-01 MED ORDER — OXYCODONE-ACETAMINOPHEN 5-325 MG PO TABS
1.0000 | ORAL_TABLET | ORAL | Status: DC | PRN
  Administered 2014-02-01 – 2014-02-02 (×6): 2 via ORAL
  Filled 2014-02-01 (×6): qty 2

## 2014-02-01 MED ORDER — ADULT MULTIVITAMIN W/MINERALS CH
1.0000 | ORAL_TABLET | Freq: Every day | ORAL | Status: DC
  Administered 2014-02-01 – 2014-02-02 (×2): 1 via ORAL
  Filled 2014-02-01 (×2): qty 1

## 2014-02-01 MED ORDER — FOLIC ACID 1 MG PO TABS: 1.0000 mg | ORAL_TABLET | Freq: Every day | ORAL | Status: DC

## 2014-02-01 MED ORDER — BOOST PO LIQD
237.0000 mL | Freq: Two times a day (BID) | ORAL | Status: DC
  Administered 2014-02-01 (×2): 237 mL via ORAL
  Filled 2014-02-01 (×6): qty 237

## 2014-02-01 MED ORDER — PANTOPRAZOLE SODIUM 40 MG PO TBEC
40.0000 mg | DELAYED_RELEASE_TABLET | Freq: Two times a day (BID) | ORAL | Status: DC
  Administered 2014-02-01 – 2014-02-02 (×3): 40 mg via ORAL
  Filled 2014-02-01 (×3): qty 1

## 2014-02-01 MED ORDER — PANCRELIPASE (LIP-PROT-AMYL) 12000-38000 UNITS PO CPEP
24000.0000 [IU] | ORAL_CAPSULE | Freq: Three times a day (TID) | ORAL | Status: DC
  Administered 2014-02-01 – 2014-02-02 (×4): 24000 [IU] via ORAL
  Filled 2014-02-01 (×6): qty 2

## 2014-02-01 MED ORDER — LOPERAMIDE HCL 2 MG PO CAPS: 2.0000 mg | ORAL_CAPSULE | ORAL | Status: DC | PRN

## 2014-02-01 MED ORDER — VITAMIN B-1 100 MG PO TABS
100.0000 mg | ORAL_TABLET | Freq: Every day | ORAL | Status: DC
  Administered 2014-02-01 – 2014-02-02 (×2): 100 mg via ORAL
  Filled 2014-02-01 (×2): qty 1

## 2014-02-01 MED ORDER — PANTOPRAZOLE SODIUM 40 MG PO TBEC: 40.0000 mg | DELAYED_RELEASE_TABLET | Freq: Two times a day (BID) | ORAL | Status: DC

## 2014-02-01 MED ORDER — THIAMINE HCL 100 MG PO TABS: 100.0000 mg | ORAL_TABLET | Freq: Two times a day (BID) | ORAL | Status: DC

## 2014-02-01 MED ORDER — FOLIC ACID 1 MG PO TABS
1.0000 mg | ORAL_TABLET | Freq: Every day | ORAL | Status: DC
Start: 2014-02-01 — End: 2014-02-02
  Administered 2014-02-01 – 2014-02-02 (×2): 1 mg via ORAL
  Filled 2014-02-01 (×2): qty 1

## 2014-02-01 NOTE — Progress Notes (Signed)
Patient ID: [name]  male  GUR:[id number]    DOB: [birth year]    DOA: 01/30/2014  PCP: Angelica Chessman, MD  Assessment/Plan: Principal Problem:   Abdominal pain due to acute on chronic pancreatitis: Likely precipitated due to alcohol abuse, states had gone to a birthday party where he drank alcohol CT abdomen and pelvis showed interval improvement in pancreatic edema and changes of pancreatitis, 23 x54mm pseudocyst - Tolerating fluids, advance to full liquid diet, continue IV fluid hydration, pain control, PPI - Recheck BMET, lipase in a.m. - Restart Creon  Active Problems:   ALCOHOL ABUSE - Continue CIWA with Ativan    TOBACCO ABUSE -Counseled on nicotine cessation     DEPRESSION - Continue home regimen, currently stable    HYPERTENSION - Currently stable, DC IV Lopressor, start metoprolol    HIATAL HERNIA WITH REFLUX - Continue oral Protonix, GI cocktail as needed    anemia - Normocytic, continue to monitor counts   DVT Prophylaxis:  Code Status: full code   Family Communication:  Disposition: hopefully tomorrow if tolerating solids  Consultants:  None   Procedures:  None   Antibiotics:  None     Subjective: Patient seen and examined, still having epigastric abdominal pain, improving, tolerating clears  Objective: Weight change: 3.493 kg (7 lb 11.2 oz)  Intake/Output Summary (Last 24 hours) at 02/01/14 1028 Last data filed at 02/01/14 0951  Gross per 24 hour  Intake 3729.56 ml  Output   4075 ml  Net -345.44 ml   Blood pressure 134/94, pulse 74, temperature 98 F (36.7 C), temperature source Oral, resp. rate 16, height 5\' 9"  (1.753 m), weight 61.553 kg (135 lb 11.2 oz), SpO2 100.00%.  Physical Exam: General: Alert and awake, oriented x3, not in any acute distress. CVS: S1-S2 clear, no murmur rubs or gallops Chest: CTAB Abdomen: soft, tenderness in the epigastric region, nondistended, normal bowel sounds  Extremities: no c/c/e  bilaterally   Lab Results: Basic Metabolic Panel:  Recent Labs Lab 01/30/14 1420 01/31/14 0600 02/01/14 0428  NA  --  142 140  K  --  4.4 5.2  CL  --  109 108  CO2  --  24 23  GLUCOSE  --  90 96  BUN  --  <3* <3*  CREATININE 0.48* 0.53 0.50  CALCIUM  --  8.4 8.9  MG  --  1.8  --   PHOS 2.8  --   --    Liver Function Tests:  Recent Labs Lab 01/31/14 0600 02/01/14 0428  AST 47* 52*  ALT 41 46  ALKPHOS 104 116  BILITOT 0.5 0.4  PROT 6.2 6.9  ALBUMIN 2.9* 3.2*    Recent Labs Lab 01/31/14 0600 02/01/14 0428  LIPASE 86* 80*   No results found for this basename: AMMONIA,  in the last 168 hours CBC:  Recent Labs Lab 01/30/14 0510  01/31/14 0600 02/01/14 0428  WBC 6.9  < > 7.9 6.6  NEUTROABS 3.9  --   --   --   HGB 10.3*  < > 10.6* 10.9*  HCT 30.0*  < > 32.1* 34.1*  MCV 90.1  < > 92.2 97.4  PLT 231  < > 249 245  < > = values in this interval not displayed. Cardiac Enzymes: No results found for this basename: CKTOTAL, CKMB, CKMBINDEX, TROPONINI,  in the last 168 hours BNP: No components found with this basename: POCBNP,  CBG: No results found for this basename: GLUCAP,  in  the last 168 hours   Micro Results: No results found for this or any previous visit (from the past 240 hour(s)).  Studies/Results: Ct Abdomen Pelvis W Contrast  01/09/2014   CLINICAL DATA:  Pancreatitis with pseudocyst.  Abdominal pain  EXAM: CT ABDOMEN AND PELVIS WITH CONTRAST  TECHNIQUE: Multidetector CT imaging of the abdomen and pelvis was performed using the standard protocol following bolus administration of intravenous contrast.  CONTRAST:  78mL OMNIPAQUE IOHEXOL 300 MG/ML  SOLN  COMPARISON:  CT abdomen 12/26/2013  FINDINGS: Lung bases are clear.  Fatty infiltration of the liver without focal liver lesion. Gallbladder is elongated and distended but not thickened. Common bile duct nondilated. Pancreas is small.  Changes of pancreatitis have improved. There is less peripancreatic  edema and pancreatic swelling compared with the prior study. 23 x 12 mm cyst in the uncinate process similar to the prior study compatible with a pseudocyst.  Negative for bowel obstruction. Scattered air-fluid levels may represent mild ileus. No ascites. Normal appendix.  Negative for mass or adenopathy. Prostate mildly enlarged. Mild bladder wall thickening unchanged.  No acute bony change. Disc degeneration in the lumbar spine especially L5-S1.  IMPRESSION: Interval improvement in pancreatic edema and changes of pancreatitis. 23 x 12 mm pseudocyst in the uncinate process similar to the prior study. No ascites.  Fatty liver   Electronically Signed   By: Franchot Gallo M.D.   On: 01/09/2014 13:43   Dg Abd Acute W/chest  01/30/2014   CLINICAL DATA:  Left lower quadrant and epigastric pain. Pancreatitis. Nausea and vomiting.  EXAM: ACUTE ABDOMEN SERIES (ABDOMEN 2 VIEW & CHEST 1 VIEW)  COMPARISON:  CT abdomen and pelvis 01/09/2014. Chest and two views abdomen 12/09/2013.  FINDINGS: Single view of the chest demonstrates clear lungs and normal heart size. No pneumothorax or pleural effusion.  Two views of the abdomen show no free intraperitoneal air. The bowel gas pattern is unremarkable. No abnormal abdominal calcification or focal bony abnormality is identified.  IMPRESSION: Negative examination.   Electronically Signed   By: Inge Rise M.D.   On: 01/30/2014 09:26    Medications: Scheduled Meds: . enoxaparin (LOVENOX) injection  40 mg Subcutaneous Q24H  . feeding supplement (RESOURCE BREEZE)  1 Container Oral TID WC  . folic acid  1 mg Oral Daily  . lactose free nutrition  237 mL Oral BID  . lipase/protease/amylase  24,000 Units Oral TID AC  . LORazepam  0-4 mg Intravenous Q12H  . multivitamin  1 tablet Oral Daily  . multivitamin with minerals  1 tablet Oral Daily  . nicotine  21 mg Transdermal Daily  . pantoprazole  40 mg Oral BID  . QUEtiapine  100 mg Oral QHS  . sertraline  75 mg Oral Daily   . thiamine  100 mg Oral Daily      LOS: 2 days   Zaeden Lastinger M.D. [facility] 02/01/2014, 10:28 AM Pager: [phone]  If 7PM-7AM, please contact night-coverage [email or web address] Password [email or web address]

## 2014-02-02 LAB — BASIC METABOLIC PANEL
ANION GAP: 9 (ref 5–15)
CHLORIDE: 110 meq/L (ref 96–112)
CO2: 25 meq/L (ref 19–32)
CREATININE: 0.54 mg/dL (ref 0.50–1.35)
Calcium: 8.8 mg/dL (ref 8.4–10.5)
GFR calc Af Amer: >90 mL/min (ref >90)
GFR calc non Af Amer: >90 mL/min (ref >90)
Glucose, Bld: 73 mg/dL (ref 70–99)
Potassium: 5 mEq/L (ref 3.7–5.3)
Sodium: 144 mEq/L (ref 137–147)

## 2014-02-02 LAB — LIPASE, BLOOD: Lipase: 52 U/L (ref 11–59)

## 2014-02-02 MED ORDER — PROMETHAZINE HCL 12.5 MG PO TABS: 12.5000 mg | ORAL_TABLET | Freq: Four times a day (QID) | ORAL | Status: DC | PRN

## 2014-02-02 MED ORDER — ONDANSETRON HCL 4 MG PO TABS: 4.0000 mg | ORAL_TABLET | Freq: Four times a day (QID) | ORAL | Status: DC | PRN

## 2014-02-02 MED ORDER — PROMETHAZINE HCL 25 MG PO TABS: 25.0000 mg | ORAL_TABLET | Freq: Four times a day (QID) | ORAL | Status: DC | PRN

## 2014-02-02 MED ORDER — OXYCODONE-ACETAMINOPHEN 5-325 MG PO TABS: 1.0000 | ORAL_TABLET | ORAL | Status: DC | PRN

## 2014-02-02 NOTE — Discharge Summary (Signed)
Physician Discharge Summary  Patient ID: [name] MRN: [id number] DOB/AGE: 1970-07-28 43 y.o.  Admit date: 01/30/2014 Discharge date: 02/02/2014  Primary Care Physician:  Jason Chessman, MD  Discharge Diagnoses:    . Pancreatitis, acute on chronic pancreatitis due to alcohol abuse  . ALCOHOL ABUSE . TOBACCO ABUSE . DEPRESSION . HYPERTENSION . HIATAL HERNIA WITH REFLUX . Protein-calorie malnutrition, severe . MDD (major depressive disorder) . Chronic pancreatitis . Hypokalemia . Anemia  Consults: None   Recommendations for Outpatient Follow-up:  Patient was recommended to avoid fatty, spicy foods and alcohol  Allergies:   Allergies  Allergen Reactions  . Shellfish-Derived Products Nausea And Vomiting  . Trazodone And Nefazodone Other (See Comments)    Muscle spasms     Discharge Medications:   Medication List         albuterol 108 (90 BASE) MCG/ACT inhaler  Commonly known as:  PROVENTIL HFA;VENTOLIN HFA  Inhale 2 puffs into the lungs every 6 (six) hours as needed for wheezing or shortness of breath.     folic acid 1 MG tablet  Commonly known as:  FOLVITE  Take 1 tablet (1 mg total) by mouth daily.     gi cocktail Susp suspension  Take 30 mLs by mouth 3 (three) times daily as needed for indigestion. Shake well.     hydrOXYzine 25 MG tablet  Commonly known as:  ATARAX/VISTARIL  Take 25 mg by mouth every 6 (six) hours as needed for anxiety.     lactose free nutrition Liqd  Take 237 mLs by mouth 2 (two) times daily.     lipase/protease/amylase 12000 UNITS Cpep capsule  Commonly known as:  CREON  Take 2 capsules (24,000 Units total) by mouth 3 (three) times daily before meals.     loperamide 2 MG capsule  Commonly known as:  IMODIUM  Take 1-2 capsules (2-4 mg total) by mouth as needed for diarrhea or loose stools.     metoprolol 50 MG tablet  Commonly known as:  LOPRESSOR  Take 1 tablet (50 mg total) by mouth 2 (two) times daily. For high  blood pressure     multivitamin with minerals Tabs tablet  Take 1 tablet by mouth daily. For low vitamin     ondansetron 4 MG tablet  Commonly known as:  ZOFRAN  Take 1 tablet (4 mg total) by mouth every 6 (six) hours as needed for nausea or vomiting.     oxyCODONE-acetaminophen 5-325 MG per tablet  Commonly known as:  PERCOCET/ROXICET  Take 1-2 tablets by mouth every 4 (four) hours as needed for moderate pain or severe pain.     pantoprazole 40 MG tablet  Commonly known as:  PROTONIX  Take 1 tablet (40 mg total) by mouth 2 (two) times daily.     promethazine 25 MG tablet  Commonly known as:  PHENERGAN  Take 1 tablet (25 mg total) by mouth every 6 (six) hours as needed for nausea or vomiting.     QUEtiapine 100 MG tablet  Commonly known as:  SEROQUEL  Take 1 tablet (100 mg total) by mouth at bedtime. For mood control     sertraline 25 MG tablet  Commonly known as:  ZOLOFT  Take 3 tablets (75 mg total) by mouth daily. For depression     thiamine 100 MG tablet  Take 100 mg by mouth 2 (two) times daily.         Brief H and P: For complete details please refer to admission  H and P, but in briefPharoah R Moran is a 43 y.o. male  With history of alcohol abuse, polysubstance abuse, hiatal hernia, hypertension, chronic pancreatitis with pseudocyst, anxiety, multiple hospitalizations of 8 admissions over the past 6 months and 12 ED visits over the past 6 months secondary to acute on chronic pancreatitis. PATIENT WAS RECENTLY ADMITTED 01/22/2014 TO 01/23/2014 FOR ACUTE ON CHRONIC pancreatitis.  Patient presented to the ED with a four-day history of epigastric abdominal pain and left lower quadrant abdominal pain. Patient states abdominal pain started 4 days prior to admission was in the left lower quadrant and epigastrium. Patient states abdominal pain somewhat improved however one day prior to admission he developed follow to 6 episodes of emesis nausea loose stools right lower  quadrant pain and burning epigastric abdominal pain. Patient states that 2 days prior to admission drank some alcohol.  Patient denies any chest pain, no shortness of breath, no fever, no chills, no weakness, no dysuria, no melena, no hematemesis, no hematochezia, no cough. Patient states drank 240 ounces of beer.  Patient presented to the emergency room was noted to have elevated lipase levels. Basic metabolic profile have a potassium of 3.4 AST of 131 ALT of 60. CBC had a hemoglobin of 10.3 otherwise was within normal limits. Urinalysis which was done was negative. Patient was given multiple doses of pain medications in the emergency room however continued to have abdominal pain   Hospital Course:  Abdominal pain due to acute on chronic pancreatitis: Likely precipitated due to alcohol abuse, states had gone to a birthday party where he drank alcohol. CT abdomen and pelvis showed interval improvement in pancreatic edema and changes of pancreatitis, 23 x35mm pseudocyst. Patient's diet has been advanced to solids, he was placed on PPI, pain control and IV fluid hydration. Restarted Creon. The patient was strongly recommended to avoid alcohol given his recurrent bouts of pancreatitis.   ALCOHOL ABUSE patient was continued on CIWA with Ativan, he had no acute alcohol withdrawals   TOBACCO ABUSE  -Counseled on nicotine cessation   DEPRESSION  - Continued home regimen, currently stable   HYPERTENSION  - Currently stable, on home dose of  metoprolol   HIATAL HERNIA WITH REFLUX  - Continue oral Protonix, GI cocktail as needed   Day of Discharge BP 109/51  Pulse 71  Temp(Src) 97.3 F (36.3 C) (Oral)  Resp 16  Ht 5\' 9"  (1.753 m)  Wt 61.009 kg (134 lb 8 oz)  BMI 19.85 kg/m2  SpO2 100%  Physical Exam: General: Alert and awake oriented x3 not in any acute distress. CVS: S1-S2 clear no murmur rubs or gallops Chest: clear to auscultation bilaterally, no wheezing rales or rhonchi Abdomen:  soft nontender, nondistended, normal bowel sounds Extremities: no cyanosis, clubbing or edema noted bilaterally Neuro: Cranial nerves II-XII intact, no focal neurological deficits   The results of significant diagnostics from this hospitalization (including imaging, microbiology, ancillary and laboratory) are listed below for reference.    LAB RESULTS: Basic Metabolic Panel:  Recent Labs Lab 01/30/14 1420 01/31/14 0600 02/01/14 0428 02/02/14 0750  NA  --  142 140 144  K  --  4.4 5.2 5.0  CL  --  109 108 110  CO2  --  24 23 25   GLUCOSE  --  90 96 73  BUN  --  <3* <3* <3*  CREATININE 0.48* 0.53 0.50 0.54  CALCIUM  --  8.4 8.9 8.8  MG  --  1.8  --   --  PHOS 2.8  --   --   --    Liver Function Tests:  Recent Labs Lab 01/31/14 0600 02/01/14 0428  AST 47* 52*  ALT 41 46  ALKPHOS 104 116  BILITOT 0.5 0.4  PROT 6.2 6.9  ALBUMIN 2.9* 3.2*    Recent Labs Lab 02/01/14 0428 02/02/14 0750  LIPASE 80* 52   No results found for this basename: AMMONIA,  in the last 168 hours CBC:  Recent Labs Lab 01/30/14 0510  01/31/14 0600 02/01/14 0428  WBC 6.9  < > 7.9 6.6  NEUTROABS 3.9  --   --   --   HGB 10.3*  < > 10.6* 10.9*  HCT 30.0*  < > 32.1* 34.1*  MCV 90.1  < > 92.2 97.4  PLT 231  < > 249 245  < > = values in this interval not displayed. Cardiac Enzymes: No results found for this basename: CKTOTAL, CKMB, CKMBINDEX, TROPONINI,  in the last 168 hours BNP: No components found with this basename: POCBNP,  CBG: No results found for this basename: GLUCAP,  in the last 168 hours  Significant Diagnostic Studies:  Dg Abd Acute W/chest  01/30/2014   CLINICAL DATA:  Left lower quadrant and epigastric pain. Pancreatitis. Nausea and vomiting.  EXAM: ACUTE ABDOMEN SERIES (ABDOMEN 2 VIEW & CHEST 1 VIEW)  COMPARISON:  CT abdomen and pelvis 01/09/2014. Chest and two views abdomen 12/09/2013.  FINDINGS: Single view of the chest demonstrates clear lungs and normal heart size. No  pneumothorax or pleural effusion.  Two views of the abdomen show no free intraperitoneal air. The bowel gas pattern is unremarkable. No abnormal abdominal calcification or focal bony abnormality is identified.  IMPRESSION: Negative examination.   Electronically Signed   By: Inge Rise M.D.   On: 01/30/2014 09:26     Disposition and Follow-up:     Discharge Instructions   Diet - low sodium heart healthy    Complete by:  As directed      Discharge instructions    Complete by:  As directed   Please avoid fried, spicy food and alcohol     Increase activity slowly    Complete by:  As directed             DISPOSITION: home  DIET: Heart healthy diet    DISCHARGE FOLLOW-UP Follow-up Information   Follow up with Jason Chessman, MD. Schedule an appointment as soon as possible for a visit in 10 days. (for hospital follow-up)    Specialty:  Internal Medicine   Contact information:   [place] [phone]       Time spent on Discharge: 38 mins  Signed:   RAI,RIPUDEEP M.D. [facility] 02/02/2014, 10:42 AM Pager: [phone]

## 2014-02-02 NOTE — Progress Notes (Signed)
AVS discharge instructions were given and went over with patient. Patient was given prescriptions for phenergan and percocet. Patient stated that he did not have any questions. Patient ambulated to his transportation with his family

## 2014-02-03 IMAGING — CT CT HEAD W/O CM
4 of 7 series · 13 of 33 positions shown, 14 images · non-contrast
Comparison: None

CT HEAD

CLINICAL DATA: Head injury and intoxication

CT HEAD WITHOUT CONTRAST
CT CERVICAL SPINE WITHOUT CONTRAST
TECHNIQUE: Multidetector CT imaging of the head and cervical spine
was performed following the standard protocol without intravenous
contrast.  Multiplanar CT image reconstructions of the cervical
spine were also generated.

[Series 5: c_spine 2.0 b31s · axial · 0.29mm/px · z∈[-241,-187]mm · 2 of 81 slices shown]
[im 27/81  bone]
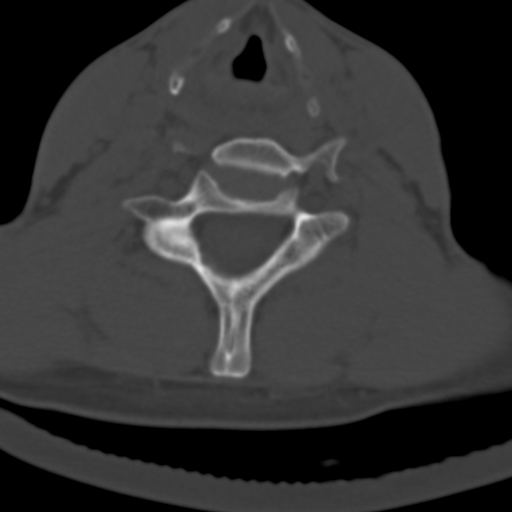
[im 54/81  bone]
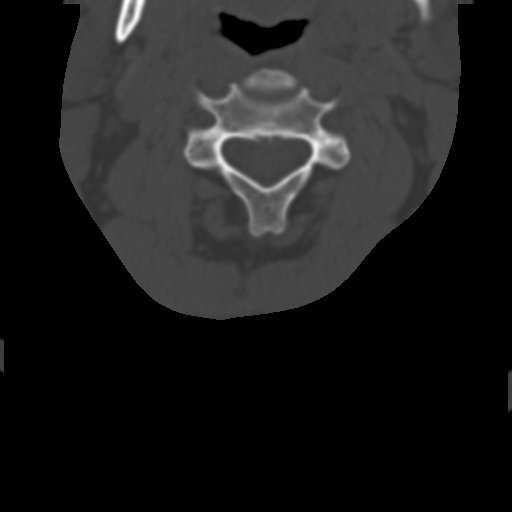

[Series 602: <mpr thick range> · axial · 0.31mm/px · z∈[-297,-204]mm · 3 of 104 slices shown, 4 images]
[im 26/104  soft-tissue]
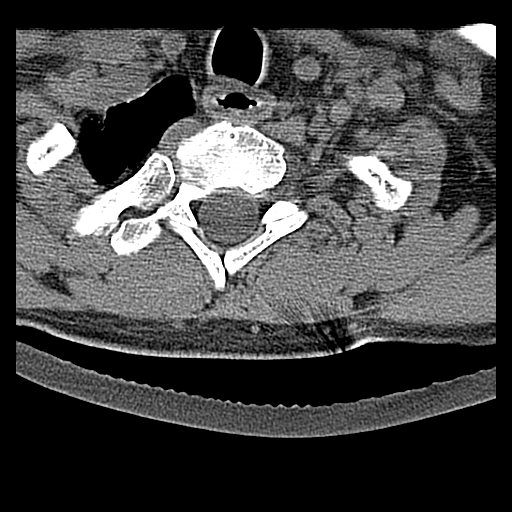
[im 26/104  bone]
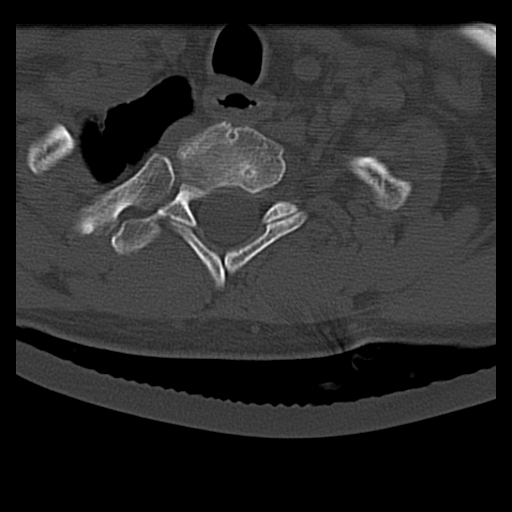
[im 52/104  bone]
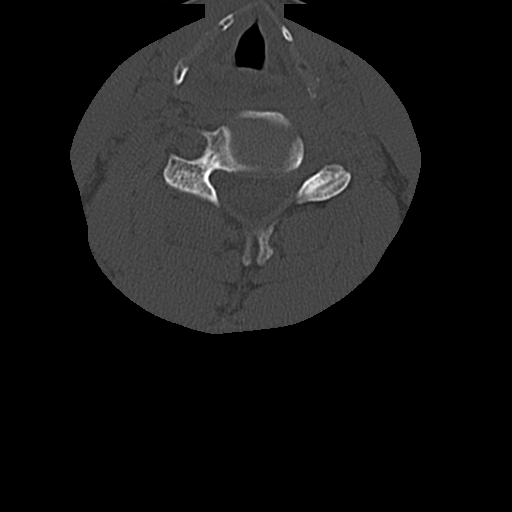
[im 78/104  bone]
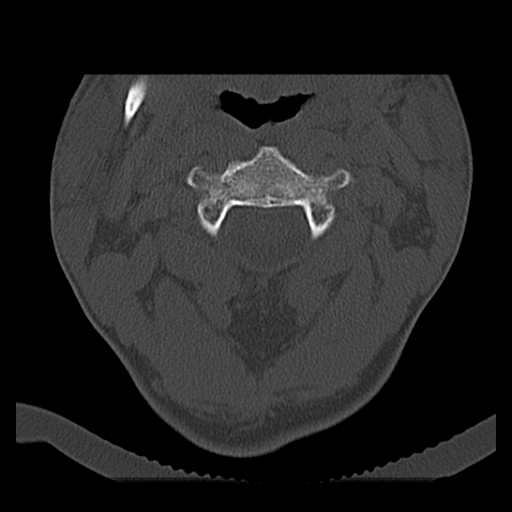

[Series 603: <mpr thick range(1)> · sagittal · 0.31mm/px · 5 of 51 slices shown]
[im 9/51  bone]
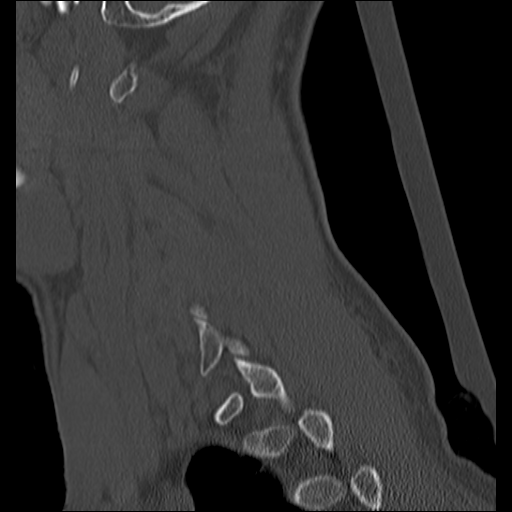
[im 17/51  bone]
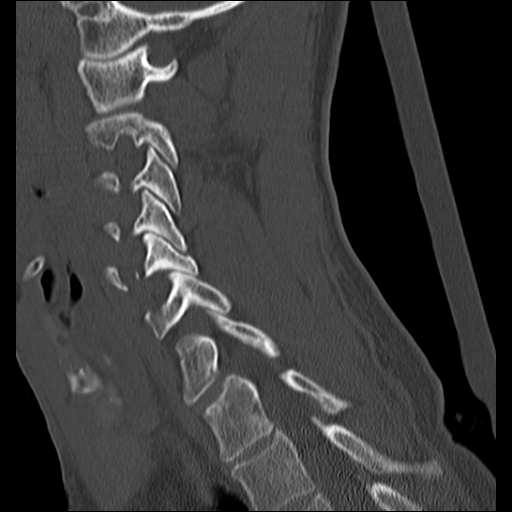
[im 26/51  bone]
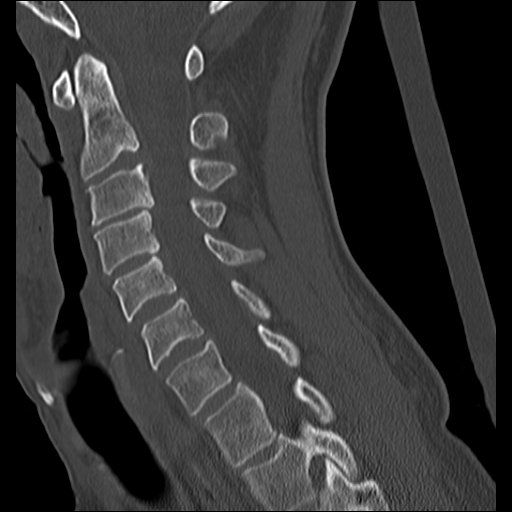
[im 34/51  bone]
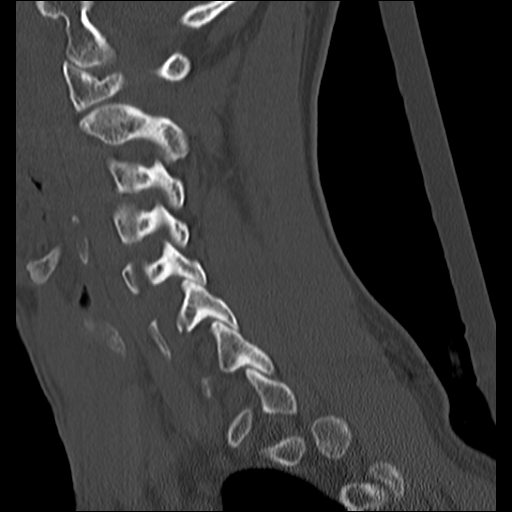
[im 42/51  bone]
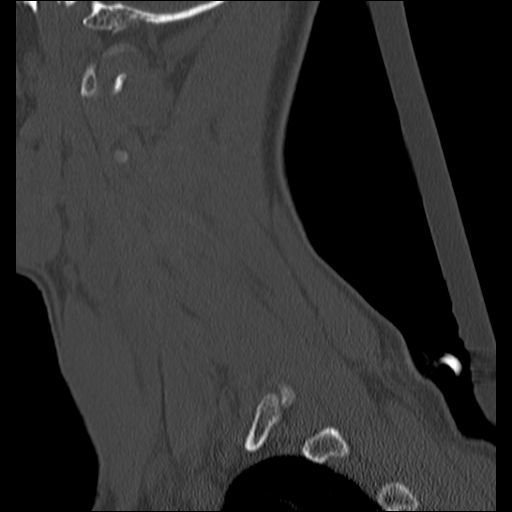

[Series 605: <mpr thick range(3)> · coronal · 0.31mm/px · 3 of 60 slices shown]
[im 12/60  bone]
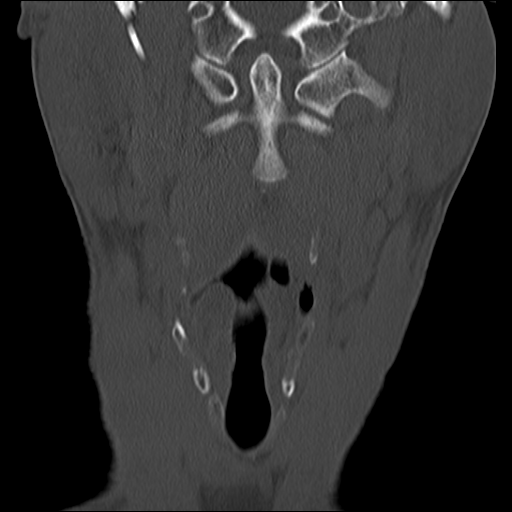
[im 24/60  bone]
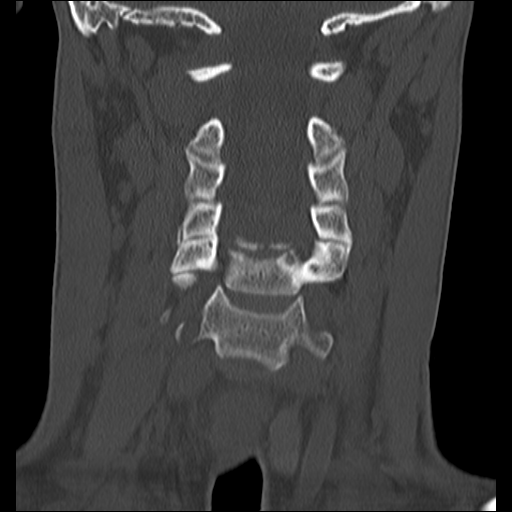
[im 36/60  bone]
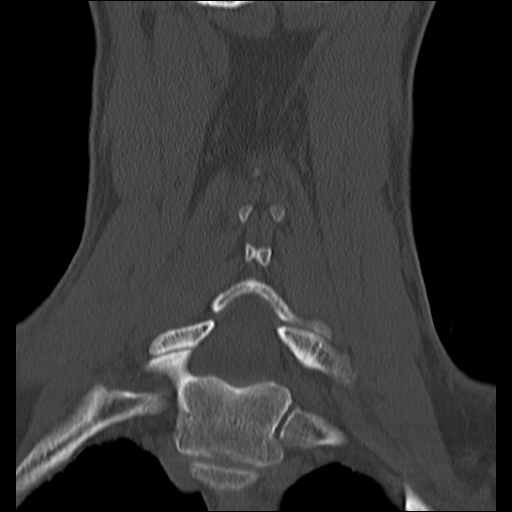

[13 of 33 positions shown; findings below may reference images not displayed]

FINDINGS: The brain has a normal appearance without evidence for
hemorrhage, infarction, hydrocephalus, or mass lesion.  There is no
extra axial fluid collection.  The skull and paranasal sinuses are
normal. Postoperative change involving the floor of the left orbit
identified.  There are postoperative changes involving the right
posterior parietal bone which appears stable from previous exam.
IMPRESSION: 1.  No acute intracranial abnormalities.

CT CERVICAL SPINE
FINDINGS: Normal alignment of the cervical spine.

The vertebral body heights are well preserved.  The facet joints
are all aligned.  There is mild disc space narrowing at C3-4 and C4-
5.

No fracture or subluxation identified.  There are bullous changes
noted in the right lung apex.

There is no fracture or subluxation identified.
IMPRESSION: 1.  Mild spondylosis.
2.  No acute fracture or subluxation identified.

## 2014-02-05 ENCOUNTER — Emergency Department (HOSPITAL_COMMUNITY)
Admission: EM | Admit: 2014-02-05 | Discharge: 2014-02-05 | Disposition: A | Discharge status: Home / Self Care | Payer: Self-pay | Attending: Emergency Medicine | Admitting: Emergency Medicine

## 2014-02-05 ENCOUNTER — Encounter (HOSPITAL_COMMUNITY): Payer: Self-pay | Admitting: Emergency Medicine

## 2014-02-05 DIAGNOSIS — F411 Generalized anxiety disorder: Secondary | ICD-10-CM | POA: Insufficient documentation

## 2014-02-05 DIAGNOSIS — G43909 Migraine, unspecified, not intractable, without status migrainosus: Secondary | ICD-10-CM | POA: Insufficient documentation

## 2014-02-05 DIAGNOSIS — Z79899 Other long term (current) drug therapy: Secondary | ICD-10-CM | POA: Insufficient documentation

## 2014-02-05 DIAGNOSIS — R112 Nausea with vomiting, unspecified: Secondary | ICD-10-CM | POA: Insufficient documentation

## 2014-02-05 DIAGNOSIS — F17200 Nicotine dependence, unspecified, uncomplicated: Secondary | ICD-10-CM | POA: Insufficient documentation

## 2014-02-05 DIAGNOSIS — D649 Anemia, unspecified: Secondary | ICD-10-CM | POA: Insufficient documentation

## 2014-02-05 DIAGNOSIS — J45909 Unspecified asthma, uncomplicated: Secondary | ICD-10-CM | POA: Insufficient documentation

## 2014-02-05 DIAGNOSIS — K219 Gastro-esophageal reflux disease without esophagitis: Secondary | ICD-10-CM | POA: Insufficient documentation

## 2014-02-05 DIAGNOSIS — E7800 Pure hypercholesterolemia, unspecified: Secondary | ICD-10-CM | POA: Insufficient documentation

## 2014-02-05 DIAGNOSIS — M129 Arthropathy, unspecified: Secondary | ICD-10-CM | POA: Insufficient documentation

## 2014-02-05 DIAGNOSIS — F329 Major depressive disorder, single episode, unspecified: Secondary | ICD-10-CM | POA: Insufficient documentation

## 2014-02-05 DIAGNOSIS — F3289 Other specified depressive episodes: Secondary | ICD-10-CM | POA: Insufficient documentation

## 2014-02-05 DIAGNOSIS — R011 Cardiac murmur, unspecified: Secondary | ICD-10-CM | POA: Insufficient documentation

## 2014-02-05 DIAGNOSIS — R1013 Epigastric pain: Secondary | ICD-10-CM | POA: Insufficient documentation

## 2014-02-05 DIAGNOSIS — I10 Essential (primary) hypertension: Secondary | ICD-10-CM | POA: Insufficient documentation

## 2014-02-05 LAB — COMPREHENSIVE METABOLIC PANEL
ALT: 47 U/L (ref 0–53)
ANION GAP: 17 — AB (ref 5–15)
AST: 57 U/L — AB (ref 0–37)
Albumin: 3.2 g/dL — ABNORMAL LOW (ref 3.5–5.2)
Alkaline Phosphatase: 116 U/L (ref 39–117)
BUN: 7 mg/dL (ref 6–23)
CHLORIDE: 107 meq/L (ref 96–112)
CO2: 20 mEq/L (ref 19–32)
Calcium: 8.5 mg/dL (ref 8.4–10.5)
Creatinine, Ser: 0.6 mg/dL (ref 0.50–1.35)
GFR calc Af Amer: >90 mL/min (ref >90)
GFR calc non Af Amer: >90 mL/min (ref >90)
Glucose, Bld: 116 mg/dL — ABNORMAL HIGH (ref 70–99)
POTASSIUM: 3.6 meq/L — AB (ref 3.7–5.3)
Sodium: 144 mEq/L (ref 137–147)
Total Protein: 7.3 g/dL (ref 6.0–8.3)

## 2014-02-05 LAB — LIPASE, BLOOD: Lipase: 96 U/L — ABNORMAL HIGH (ref 11–59)

## 2014-02-05 LAB — CBC WITH DIFFERENTIAL/PLATELET
Basophils Absolute: 0 10*3/uL (ref 0.0–0.1)
Basophils Relative: 0 % (ref 0–1)
EOS PCT: 2 % (ref 0–5)
Eosinophils Absolute: 0.2 10*3/uL (ref 0.0–0.7)
HCT: 31 % — ABNORMAL LOW (ref 39.0–52.0)
Hemoglobin: 10.3 g/dL — ABNORMAL LOW (ref 13.0–17.0)
Lymphocytes Relative: 29 % (ref 12–46)
Lymphs Abs: 2.7 10*3/uL (ref 0.7–4.0)
MCH: 31.7 pg (ref 26.0–34.0)
MCHC: 33.2 g/dL (ref 30.0–36.0)
MCV: 95.4 fL (ref 78.0–100.0)
MONO ABS: 0.5 10*3/uL (ref 0.1–1.0)
Monocytes Relative: 5 % (ref 3–12)
Neutro Abs: 5.8 10*3/uL (ref 1.7–7.7)
Neutrophils Relative %: 64 % (ref 43–77)
Platelets: 239 10*3/uL (ref 150–400)
RBC: 3.25 MIL/uL — AB (ref 4.22–5.81)
RDW: 17.8 % — ABNORMAL HIGH (ref 11.5–15.5)
WBC: 9.2 10*3/uL (ref 4.0–10.5)

## 2014-02-05 MED ORDER — ONDANSETRON HCL 4 MG/2ML IJ SOLN
4.0000 mg | Freq: Once | INTRAMUSCULAR | Status: AC
  Administered 2014-02-05: 4 mg via INTRAVENOUS
  Filled 2014-02-05: qty 2

## 2014-02-05 MED ORDER — HYDROMORPHONE HCL 1 MG/ML IJ SOLN
1.0000 mg | Freq: Once | INTRAMUSCULAR | Status: AC
  Administered 2014-02-05: 1 mg via INTRAVENOUS
  Filled 2014-02-05: qty 1

## 2014-02-05 MED ORDER — SODIUM CHLORIDE 0.9 % IV BOLUS (SEPSIS)
1000.0000 mL | Freq: Once | INTRAVENOUS | Status: AC
  Administered 2014-02-05: 1000 mL via INTRAVENOUS

## 2014-02-05 MED ORDER — HYDROMORPHONE HCL 1 MG/ML IJ SOLN
1.0000 mg | Freq: Once | INTRAMUSCULAR | Status: AC
Start: 2014-02-05 — End: 2014-02-05
  Administered 2014-02-05: 1 mg via INTRAVENOUS
  Filled 2014-02-05: qty 1

## 2014-02-05 NOTE — Discharge Instructions (Signed)
Follow up with your doctor for further evaluation. Return to the ED with worsening or concerning symptoms.  °

## 2014-02-05 NOTE — ED Notes (Addendum)
Pt from home via [facility] with c/o increasing abdominal pain.  Pt was seen and dx with pancreatitis, d/c on Sunday.  Pt was not having pain relief with d/c pain medications.  Pt denies drinking ETOH since d/c. Ambulatory, NAD, A&O.

## 2014-02-05 NOTE — ED Provider Notes (Signed)
4:12 PM Pt signed out at shift change by Meredeth Ide, PA-C.  Labs: unremarkable. Pt may be discharged home to f/u with PCP.     4:28 PM Pt met with case management who was able to schedule a f/u appointment with Dr. Alinda Money for tomorrow, 02/06/14 at 3:30PM. Home care instructions provided. Pt verbalized understanding and agreement with tx plan.       Noland Fordyce, PA-C 02/05/14 [phone]

## 2014-02-05 NOTE — Progress Notes (Signed)
  CARE MANAGEMENT ED NOTE 02/05/2014  Patient:  [name], [name]   Account Number:  [id number]  Date Initiated:  02/05/2014  Documentation initiated by:  Edwyna Shell  Subjective/Objective Assessment:   43 yo male presenting to the ED with abdominal pain     Subjective/Objective Assessment Detail:     Action/Plan:   Patient has follow up appointment at [facility] Oct 1 at 3:30   Action/Plan Detail:   Anticipated DC Date:       Status Recommendation to Physician:   Result of Recommendation:  Agreed    DC Planning Services  CM consult  Follow-up appt scheduled    Choice offered to / List presented to:            Status of service:  Completed, signed off  ED Comments:   ED Comments Detail:  This CM spoke with the patient regarding his visit and the patient stated that he has not been to a follow up appointment at the [facility]. This CM dsicussed the patient assist application for Creon and he stated that he has not taken care of that either is is going to run out of Creon soon. This CM contacted the [facility] and an appointment was scheduled for Thursday, Oct 1 at 3:30. This was communicated with the patient and he stated that he will get to the appointment and he will bring the patient assist application for Creon. This information was provided to ED PA, Hilda Blades.

## 2014-02-05 NOTE — ED Provider Notes (Signed)
CSN: [id number]     Arrival date & time 02/05/14  1300 History   First MD Initiated Contact with Patient 02/05/14 1310     Chief Complaint  Patient presents with  . Abdominal Pain     (Consider location/radiation/quality/duration/timing/severity/associated sxs/prior Treatment) HPI Comments: Patient is a 43 year old male with a past medical history of hypertension, asthma, pancreatitis, cocaine abuse, GERD, and WPW who presents with abdominal pain The pain is located in his epigastrium and does not radiate. The pain is described as aching and severe. The pain started gradually and progressively worsened since the onset. No alleviating/aggravating factors. The patient has tried PO oxycodone for symptoms without relief. Associated symptoms include NVD. Patient denies fever, headache, chest pain, SOB, dysuria, constipation. Patient denies any alcohol use in the past week and denies cocaine use.    Past Medical History  Diagnosis Date  . Hypertension   . Asthma   . Pancreatitis   . Cocaine abuse   . Depression   . H/O suicide attempt   . Heart murmur     "when he was little" (03/06/2013)  . Shortness of breath     "can happen at anytime" (03/06/2013)  . Anemia   . H/O hiatal hernia   . GERD (gastroesophageal reflux disease)   . Anxiety   . WPW (Wolff-Parkinson-White syndrome)     [name] 03/06/2013  . High cholesterol   . Migraine     "monthly" (01/30/2014)  . Arthritis     "knees; arms; elbows" (01/30/2014)   Past Surgical History  Procedure Laterality Date  . Facial fracture surgery Left 1990's    "result of trauma"   . Eye surgery Left 1990's    "result of trauma"    Family History  Problem Relation Age of Onset  . Hypertension Other   . Coronary artery disease Other    History  Substance Use Topics  . Smoking status: Current Every Day Smoker -- 1.00 packs/day for 30 years    Types: Cigarettes  . Smokeless tobacco: Current User    Types: Chew  . Alcohol Use: 28.2  oz/week    47 Cans of beer per week     Comment: 01/16/23/2015 "I drink 2, 40oz  beers per day"    Review of Systems  Constitutional: Negative for fever, chills and fatigue.  HENT: Negative for trouble swallowing.   Eyes: Negative for visual disturbance.  Respiratory: Negative for shortness of breath.   Cardiovascular: Negative for chest pain and palpitations.  Gastrointestinal: Positive for nausea, vomiting and abdominal pain. Negative for diarrhea.  Genitourinary: Negative for dysuria and difficulty urinating.  Musculoskeletal: Negative for arthralgias and neck pain.  Skin: Negative for color change.  Neurological: Negative for dizziness and weakness.  Psychiatric/Behavioral: Negative for dysphoric mood.      Allergies  Shellfish-derived products and Trazodone and nefazodone  Home Medications   Prior to Admission medications   Medication Sig Start Date End Date Taking? Authorizing Provider  albuterol (PROVENTIL HFA;VENTOLIN HFA) 108 (90 BASE) MCG/ACT inhaler Inhale 2 puffs into the lungs every 6 (six) hours as needed for wheezing or shortness of breath.    Historical Provider, MD  Alum & Mag Hydroxide-Simeth (GI COCKTAIL) SUSP suspension Take 30 mLs by mouth 3 (three) times daily as needed for indigestion. Shake well. 01/15/14   Shanker Kristeen Mans, MD  folic acid (FOLVITE) 1 MG tablet Take 1 tablet (1 mg total) by mouth daily. 11/26/13   Shanker Kristeen Mans, MD  hydrOXYzine (  ATARAX/VISTARIL) 25 MG tablet Take 25 mg by mouth every 6 (six) hours as needed for anxiety.    Historical Provider, MD  lactose free nutrition (BOOST) LIQD Take 237 mLs by mouth 2 (two) times daily.    Historical Provider, MD  lipase/protease/amylase (CREON) 12000 UNITS CPEP capsule Take 2 capsules (24,000 Units total) by mouth 3 (three) times daily before meals. 01/15/14   Shanker Kristeen Mans, MD  loperamide (IMODIUM) 2 MG capsule Take 1-2 capsules (2-4 mg total) by mouth as needed for diarrhea or loose stools. 01/23/14    [name], PA-C  metoprolol (LOPRESSOR) 50 MG tablet Take 1 tablet (50 mg total) by mouth 2 (two) times daily. For high blood pressure 11/28/13   [name], MD  Multiple Vitamin (MULTIVITAMIN WITH MINERALS) TABS tablet Take 1 tablet by mouth daily. For low vitamin 11/28/13   [name], MD  oxyCODONE-acetaminophen (PERCOCET/ROXICET) 5-325 MG per tablet Take 1-2 tablets by mouth every 4 (four) hours as needed for moderate pain or severe pain. 02/02/14   Ripudeep Krystal Eaton, MD  pantoprazole (PROTONIX) 40 MG tablet Take 1 tablet (40 mg total) by mouth 2 (two) times daily. 11/28/13   [name], MD  promethazine (PHENERGAN) 25 MG tablet Take 1 tablet (25 mg total) by mouth every 6 (six) hours as needed for nausea or vomiting. 02/02/14   Ripudeep Krystal Eaton, MD  QUEtiapine (SEROQUEL) 100 MG tablet Take 1 tablet (100 mg total) by mouth at bedtime. For mood control 01/15/14   Jonetta Osgood, MD  sertraline (ZOLOFT) 25 MG tablet Take 3 tablets (75 mg total) by mouth daily. For depression 11/28/13   [name], MD  thiamine 100 MG tablet Take 100 mg by mouth 2 (two) times daily.    Historical Provider, MD   BP 134/93  Pulse 103  Temp(Src) 98.4 F (36.9 C) (Oral)  Resp 14  SpO2 100% Physical Exam  Nursing note and vitals reviewed. Constitutional: He is oriented to person, place, and time. He appears well-developed and well-nourished. No distress.  HENT:  Head: Normocephalic and atraumatic.  Eyes: Conjunctivae and EOM are normal. No scleral icterus.  Neck: Normal range of motion.  Cardiovascular: Normal rate and regular rhythm.  Exam reveals no gallop and no friction rub.   No murmur heard. Pulmonary/Chest: Effort normal and breath sounds normal. He has no wheezes. He has no rales. He exhibits no tenderness.  Abdominal: Soft. He exhibits no distension. There is tenderness. There is no rebound and no guarding.  Epigastric tenderness to palpation without other focal  tenderness.   Musculoskeletal: Normal range of motion.  Neurological: He is alert and oriented to person, place, and time. Coordination normal.  Speech is goal-oriented. Moves limbs without ataxia.   Skin: Skin is warm and dry.  Psychiatric: He has a normal mood and affect. His behavior is normal.    ED Course  Procedures (including critical care time) Labs Review Labs Reviewed  CBC WITH DIFFERENTIAL - Abnormal; Notable for the following:    RBC 3.25 (*)    Hemoglobin 10.3 (*)    HCT 31.0 (*)    RDW 17.8 (*)    All other components within normal limits  COMPREHENSIVE METABOLIC PANEL - Abnormal; Notable for the following:    Potassium 3.6 (*)    Glucose, Bld 116 (*)    Albumin 3.2 (*)    AST 57 (*)    Total Bilirubin <0.2 (*)    Anion gap  17 (*)    All other components within normal limits  LIPASE, BLOOD - Abnormal; Notable for the following:    Lipase 96 (*)    All other components within normal limits    Imaging Review No results found.   EKG Interpretation None      MDM   Final diagnoses:  Epigastric abdominal pain  Non-intractable vomiting with nausea, vomiting of unspecified type    2:39 PM Labs pending. Patient will have fluids, dilaudid, and zofran for symptoms.   4:05 PM Patient signed out to Noland Fordyce, [place], PA-C 02/06/14 2045

## 2014-02-06 ENCOUNTER — Encounter: Payer: Self-pay | Admitting: Internal Medicine

## 2014-02-06 ENCOUNTER — Ambulatory Visit: Payer: Self-pay | Attending: Internal Medicine | Admitting: Internal Medicine

## 2014-02-06 VITALS — BP 132/94 | HR 85 | Temp 98.0°F | Resp 16 | Ht 71.0 in | Wt 130.0 lb

## 2014-02-06 DIAGNOSIS — I10 Essential (primary) hypertension: Secondary | ICD-10-CM | POA: Insufficient documentation

## 2014-02-06 DIAGNOSIS — K852 Alcohol induced acute pancreatitis without necrosis or infection: Secondary | ICD-10-CM

## 2014-02-06 DIAGNOSIS — F329 Major depressive disorder, single episode, unspecified: Secondary | ICD-10-CM

## 2014-02-06 DIAGNOSIS — Z79899 Other long term (current) drug therapy: Secondary | ICD-10-CM | POA: Insufficient documentation

## 2014-02-06 DIAGNOSIS — I456 Pre-excitation syndrome: Secondary | ICD-10-CM | POA: Insufficient documentation

## 2014-02-06 DIAGNOSIS — F39 Unspecified mood [affective] disorder: Secondary | ICD-10-CM | POA: Insufficient documentation

## 2014-02-06 DIAGNOSIS — F17210 Nicotine dependence, cigarettes, uncomplicated: Secondary | ICD-10-CM | POA: Insufficient documentation

## 2014-02-06 DIAGNOSIS — F1010 Alcohol abuse, uncomplicated: Secondary | ICD-10-CM | POA: Insufficient documentation

## 2014-02-06 DIAGNOSIS — F1410 Cocaine abuse, uncomplicated: Secondary | ICD-10-CM | POA: Insufficient documentation

## 2014-02-06 DIAGNOSIS — F419 Anxiety disorder, unspecified: Secondary | ICD-10-CM | POA: Insufficient documentation

## 2014-02-06 DIAGNOSIS — J45909 Unspecified asthma, uncomplicated: Secondary | ICD-10-CM | POA: Insufficient documentation

## 2014-02-06 DIAGNOSIS — F32A Depression, unspecified: Secondary | ICD-10-CM

## 2014-02-06 DIAGNOSIS — K219 Gastro-esophageal reflux disease without esophagitis: Secondary | ICD-10-CM | POA: Insufficient documentation

## 2014-02-06 MED ORDER — MULTIVITAMINS PO CAPS: 1.0000 | ORAL_CAPSULE | Freq: Every day | ORAL | Status: DC

## 2014-02-06 MED ORDER — TRAMADOL HCL 50 MG PO TABS: 50.0000 mg | ORAL_TABLET | Freq: Two times a day (BID) | ORAL | Status: DC | PRN

## 2014-02-06 MED ORDER — SERTRALINE HCL 25 MG PO TABS: 25.0000 mg | ORAL_TABLET | Freq: Every day | ORAL | Status: DC

## 2014-02-06 MED ORDER — PANCRELIPASE (LIP-PROT-AMYL) 12000-38000 UNITS PO CPEP: 24000.0000 [IU] | ORAL_CAPSULE | Freq: Three times a day (TID) | ORAL | Status: DC

## 2014-02-06 MED ORDER — METOPROLOL TARTRATE 25 MG PO TABS: 25.0000 mg | ORAL_TABLET | Freq: Two times a day (BID) | ORAL | Status: DC

## 2014-02-06 NOTE — Progress Notes (Signed)
Patient ID: [name], male   DOB: 26-Jan-1971, 43 y.o.   MRN: [id number]   Janson Lamar, is a 43 y.o. male  TFT:[id number]  HCW:[id number]  DOB - [birth year]  Chief Complaint  Patient presents with  . Follow-up        Subjective:   [name] is a 43 y.o. male here today for a follow up visit. Patient with history of hypertension, asthma, chronic pancreatitis, cocaine abuse, GERD, and WPW recently seen in the ED for abdominal pain. Patient is here today for follow-up hospital ED visit. Patient claims abdominal pain persists, occasionally with diarrhea. Patient claimed he had flu shot recently and now having runny nose, coughing, headaches and burning eyes. Patient visits ED very frequently for the same complaints. Patient also has depression that has not been treated. Continues to smoke cigarettes heavily. Patient denies fever. Denies ongoing cocaine use. Patient has No headache, No chest pain,  No new weakness tingling or numbness.  Problem  Alcohol Abuse  Essential Hypertension  Depression (Emotion)    ALLERGIES: Allergies  Allergen Reactions  . Shellfish-Derived Products Nausea And Vomiting  . Trazodone And Nefazodone Other (See Comments)    Muscle spasms    PAST MEDICAL HISTORY: Past Medical History  Diagnosis Date  . Hypertension   . Asthma   . Pancreatitis   . Cocaine abuse   . Depression   . H/O suicide attempt   . Heart murmur     "when he was little" (03/06/2013)  . Shortness of breath     "can happen at anytime" (03/06/2013)  . Anemia   . H/O hiatal hernia   . GERD (gastroesophageal reflux disease)   . Anxiety   . WPW (Wolff-Parkinson-White syndrome)     [name] 03/06/2013  . High cholesterol   . Migraine     "monthly" (01/30/2014)  . Arthritis     "knees; arms; elbows" (01/30/2014)    MEDICATIONS AT HOME: Prior to Admission medications   Medication Sig Start Date End Date Taking? Authorizing Provider  albuterol (PROVENTIL HFA;VENTOLIN HFA)  108 (90 BASE) MCG/ACT inhaler Inhale 2 puffs into the lungs every 6 (six) hours as needed for wheezing or shortness of breath.   Yes Historical Provider, MD  folic acid (FOLVITE) 1 MG tablet Take 1 tablet (1 mg total) by mouth daily. 11/26/13  Yes Shanker Kristeen Mans, MD  hydrOXYzine (ATARAX/VISTARIL) 25 MG tablet Take 25 mg by mouth every 6 (six) hours as needed for anxiety.   Yes Historical Provider, MD  lactose free nutrition (BOOST) LIQD Take 237 mLs by mouth 2 (two) times daily.   Yes Historical Provider, MD  loperamide (IMODIUM) 2 MG capsule Take 1-2 capsules (2-4 mg total) by mouth as needed for diarrhea or loose stools. 01/23/14  Yes Marianne L York, PA-C  Multiple Vitamin (MULTIVITAMIN WITH MINERALS) TABS tablet Take 1 tablet by mouth daily. For low vitamin 11/28/13  Yes [name], MD  oxyCODONE-acetaminophen (PERCOCET/ROXICET) 5-325 MG per tablet Take 1-2 tablets by mouth every 4 (four) hours as needed for moderate pain or severe pain. 02/02/14  Yes Ripudeep Krystal Eaton, MD  pantoprazole (PROTONIX) 40 MG tablet Take 1 tablet (40 mg total) by mouth 2 (two) times daily. 11/28/13  Yes [name], MD  promethazine (PHENERGAN) 25 MG tablet Take 1 tablet (25 mg total) by mouth every 6 (six) hours as needed for nausea or vomiting. 02/02/14  Yes Ripudeep Krystal Eaton, MD  QUEtiapine (SEROQUEL) 100 MG tablet Take 1  tablet (100 mg total) by mouth at bedtime. For mood control 01/15/14  Yes Shanker Kristeen Mans, MD  thiamine 100 MG tablet Take 100 mg by mouth daily.    Yes Historical Provider, MD  lipase/protease/amylase (CREON) 12000 UNITS CPEP capsule Take 2 capsules (24,000 Units total) by mouth 3 (three) times daily with meals. 02/06/14   [name], MD  metoprolol tartrate (LOPRESSOR) 25 MG tablet Take 1 tablet (25 mg total) by mouth 2 (two) times daily. 02/06/14   [name], MD  Multiple Vitamin (MULTIVITAMIN) capsule Take 1 capsule by mouth daily. 02/06/14   [name], MD    sertraline (ZOLOFT) 25 MG tablet Take 1 tablet (25 mg total) by mouth daily. 02/06/14   [name], MD  traMADol (ULTRAM) 50 MG tablet Take 1 tablet (50 mg total) by mouth every 12 (twelve) hours as needed for moderate pain. 02/06/14   [name], MD     Objective:   Filed Vitals:   02/06/14 1459  BP: 132/94  Pulse: 85  Temp: 98 F (36.7 C)  TempSrc: Oral  Resp: 16  Height: 5\' 11"  (1.803 m)  Weight: 130 lb (58.968 kg)  SpO2: 100%    Exam General appearance : Awake, alert, not in any distress. Speech Clear. Not toxic looking HEENT: Atraumatic and Normocephalic, pupils equally reactive to light and accomodation Neck: supple, no JVD. No cervical lymphadenopathy.  Chest:Good air entry bilaterally, no added sounds  CVS: S1 S2 regular, no murmurs.  Abdomen: Bowel sounds present, Non tender and not distended with no gaurding, rigidity or rebound. Extremities: B/L Lower Ext shows no edema, both legs are warm to touch Neurology: Awake alert, and oriented X 3, CN II-XII intact, Non focal Skin:No Rash Wounds:N/A  Data Review Lab Results  Component Value Date   HGBA1C 4.5 10/07/2013     Assessment & Plan   1. Essential hypertension  - metoprolol tartrate (LOPRESSOR) 25 MG tablet; Take 1 tablet (25 mg total) by mouth 2 (two) times daily.  Dispense: 180 tablet; Refill: 3  - We have discussed target BP range and blood pressure goal - I have advised patient to check BP regularly and to call us back or report to clinic if the numbers are consistently higher than 140/90  - We discussed the importance of compliance with medical therapy and DASH diet recommended, consequences of uncontrolled hypertension discussed.   2. Alcohol-induced acute pancreatitis  - Multiple Vitamin (MULTIVITAMIN) capsule; Take 1 capsule by mouth daily.  Dispense: 90 capsule; Refill: 3 - lipase/protease/amylase (CREON) 12000 UNITS CPEP capsule; Take 2 capsules (24,000 Units total) by mouth 3  (three) times daily with meals.  Dispense: 270 capsule; Refill: 3 - traMADol (ULTRAM) 50 MG tablet; Take 1 tablet (50 mg total) by mouth every 12 (twelve) hours as needed for moderate pain.  Dispense: 90 tablet; Refill: 0  3. Depression (emotion)  - sertraline (ZOLOFT) 25 MG tablet; Take 1 tablet (25 mg total) by mouth daily.  Dispense: 30 tablet; Refill: 3  The patient was counseled on the dangers of tobacco use, and was advised to quit. Reviewed strategies to maximize success, including removing cigarettes and smoking materials from environment, stress management and support of family/friends.  Return if symptoms worsen or fail to improve, for Follow up Pain and comorbidities, Abdominal Pain.  The patient was given clear instructions to go to ER or return to medical center if symptoms don't improve, worsen or new problems develop. The patient verbalized  understanding. The patient was told to call to get lab results if they haven't heard anything in the next week.   This note has been created with Surveyor, quantity. Any transcriptional errors are unintentional.    Angelica Chessman, MD, [place], [place], [place] and [place], [place]   02/06/2014, 3:39 PM

## 2014-02-06 NOTE — ED Provider Notes (Signed)
Medical screening examination/treatment/procedure(s) were performed by non-physician practitioner and as supervising physician I was immediately available for consultation/collaboration.   EKG Interpretation None        Ephraim Hamburger, MD 02/06/14 [phone]

## 2014-02-06 NOTE — Patient Instructions (Signed)
Acute Pancreatitis Acute pancreatitis is a disease in which the pancreas becomes suddenly inflamed. The pancreas is a large gland located behind your stomach. The pancreas produces enzymes that help digest food. The pancreas also releases the hormones glucagon and insulin that help regulate blood sugar. Damage to the pancreas occurs when the digestive enzymes from the pancreas are activated and begin attacking the pancreas before being released into the intestine. Most acute attacks last a couple of days and can cause serious complications. Some people become dehydrated and develop low blood pressure. In severe cases, bleeding into the pancreas can lead to shock and can be life-threatening. The lungs, heart, and kidneys may fail. CAUSES  Pancreatitis can happen to anyone. In some cases, the cause is unknown. Most cases are caused by:  Alcohol abuse.  Gallstones. Other less common causes are:  Certain medicines.  Exposure to certain chemicals.  Infection.  Damage caused by an accident (trauma).  Abdominal surgery. SYMPTOMS   Pain in the upper abdomen that may radiate to the back.  Tenderness and swelling of the abdomen.  Nausea and vomiting. DIAGNOSIS  Your caregiver will perform a physical exam. Blood and stool tests may be done to confirm the diagnosis. Imaging tests may also be done, such as X-rays, CT scans, or an ultrasound of the abdomen. TREATMENT  Treatment usually requires a stay in the hospital. Treatment may include:  Pain medicine.  Fluid replacement through an intravenous line (IV).  Placing a tube in the stomach to remove stomach contents and control vomiting.  Not eating for 3 or 4 days. This gives your pancreas a rest, because enzymes are not being produced that can cause further damage.  Antibiotic medicines if your condition is caused by an infection.  Surgery of the pancreas or gallbladder. HOME CARE INSTRUCTIONS   Follow the diet advised by your  caregiver. This may involve avoiding alcohol and decreasing the amount of fat in your diet.  Eat smaller, more frequent meals. This reduces the amount of digestive juices the pancreas produces.  Drink enough fluids to keep your urine clear or pale yellow.  Only take over-the-counter or prescription medicines as directed by your caregiver.  Avoid drinking alcohol if it caused your condition.  Do not smoke.  Get plenty of rest.  Check your blood sugar at home as directed by your caregiver.  Keep all follow-up appointments as directed by your caregiver. SEEK MEDICAL CARE IF:   You do not recover as quickly as expected.  You develop new or worsening symptoms.  You have persistent pain, weakness, or nausea.  You recover and then have another episode of pain. SEEK IMMEDIATE MEDICAL CARE IF:   You are unable to eat or keep fluids down.  Your pain becomes severe.  You have a fever or persistent symptoms for more than 2 to 3 days.  You have a fever and your symptoms suddenly get worse.  Your skin or the white part of your eyes turn yellow (jaundice).  You develop vomiting.  You feel dizzy, or you faint.  Your blood sugar is high (over 300 mg/dL). MAKE SURE YOU:   Understand these instructions.  Will watch your condition.  Will get help right away if you are not doing well or get worse. Document Released: 04/25/2005 Document Revised: 10/25/2011 Document Reviewed: 08/04/2011 Exit[employer] Patient Information 2015 [employer]. This information is not intended to replace advice given to you by your health care provider. Make sure you discuss any questions you have   with your health care provider. ° ° °Alcohol Use Disorder °Alcohol use disorder is a mental disorder. It is not a one-time incident of heavy drinking. Alcohol use disorder is the excessive and uncontrollable use of alcohol over time that leads to problems with functioning in one or more areas of daily living. People  with this disorder risk harming themselves and others when they drink to excess. Alcohol use disorder also can cause other mental disorders, such as mood and anxiety disorders, and serious physical problems. People with alcohol use disorder often misuse other drugs.  °Alcohol use disorder is common and widespread. Some people with this disorder drink alcohol to cope with or escape from negative life events. Others drink to relieve chronic pain or symptoms of mental illness. People with a family history of alcohol use disorder are at higher risk of losing control and using alcohol to excess.  °SYMPTOMS  °Signs and symptoms of alcohol use disorder may include the following:  °· Consumption of alcohol in larger amounts or over a longer period of time than intended. °· Multiple unsuccessful attempts to cut down or control alcohol use.   °· A great deal of time spent obtaining alcohol, using alcohol, or recovering from the effects of alcohol (hangover). °· A strong desire or urge to use alcohol (cravings).   °· Continued use of alcohol despite problems at work, school, or home because of alcohol use.   °· Continued use of alcohol despite problems in relationships because of alcohol use. °· Continued use of alcohol in situations when it is physically hazardous, such as driving a car. °· Continued use of alcohol despite awareness of a physical or psychological problem that is likely related to alcohol use. Physical problems related to alcohol use can involve the brain, heart, liver, stomach, and intestines. Psychological problems related to alcohol use include intoxication, depression, anxiety, psychosis, delirium, and dementia.   °· The need for increased amounts of alcohol to achieve the same desired effect, or a decreased effect from the consumption of the same amount of alcohol (tolerance). °· Withdrawal symptoms upon reducing or stopping alcohol use, or alcohol use to reduce or avoid withdrawal symptoms. Withdrawal  symptoms include: °¨ Racing heart. °¨ Hand tremor. °¨ Difficulty sleeping. °¨ Nausea. °¨ Vomiting. °¨ Hallucinations. °¨ Restlessness. °¨ Seizures. °DIAGNOSIS °Alcohol use disorder is diagnosed through an assessment by your health care provider. Your health care provider may start by asking three or four questions to screen for excessive or problematic alcohol use. To confirm a diagnosis of alcohol use disorder, at least two symptoms must be present within a 12-month period. The severity of alcohol use disorder depends on the number of symptoms: °· Mild--two or three. °· Moderate--four or five. °· Severe--six or more. °Your health care provider may perform a physical exam or use results from lab tests to see if you have physical problems resulting from alcohol use. Your health care provider may refer you to a mental health professional for evaluation. °TREATMENT  °Some people with alcohol use disorder are able to reduce their alcohol use to low-risk levels. Some people with alcohol use disorder need to quit drinking alcohol. When necessary, mental health professionals with specialized training in substance use treatment can help. Your health care provider can help you decide how severe your alcohol use disorder is and what type of treatment you need. The following forms of treatment are available:  °· Detoxification. Detoxification involves the use of prescription medicines to prevent alcohol withdrawal symptoms in the first week after quitting. This is   important for people with a history of symptoms of withdrawal and for heavy drinkers who are likely to have withdrawal symptoms. Alcohol withdrawal can be dangerous and, in severe cases, cause death. Detoxification is usually provided in a hospital or in-patient substance use treatment facility. °· Counseling or talk therapy. Talk therapy is provided by substance use treatment counselors. It addresses the reasons people use alcohol and ways to keep them from drinking  again. The goals of talk therapy are to help people with alcohol use disorder find healthy activities and ways to cope with life stress, to identify and avoid triggers for alcohol use, and to handle cravings, which can cause relapse. °· Medicines. Different medicines can help treat alcohol use disorder through the following actions: °¨ Decrease alcohol cravings. °¨ Decrease the positive reward response felt from alcohol use. °¨ Produce an uncomfortable physical reaction when alcohol is used (aversion therapy). °· Support groups. Support groups are run by people who have quit drinking. They provide emotional support, advice, and guidance. °These forms of treatment are often combined. Some people with alcohol use disorder benefit from intensive combination treatment provided by specialized substance use treatment centers. Both inpatient and outpatient treatment programs are available. °Document Released: 06/02/2004 Document Revised: 09/09/2013 Document Reviewed: 08/02/2012 °Exit[employer]® Patient Information ©2015 [employer]. This information is not intended to replace advice given to you by your health care provider. Make sure you discuss any questions you have with your health care provider. ° °

## 2014-02-06 NOTE — Progress Notes (Signed)
[facility] Pt is still having severe pain in his lower left abdomen w/ diarrhea.  Pt had a flu shot and is now having runny nose, coughing, headaches and his eyes are burning.

## 2014-02-07 ENCOUNTER — Emergency Department (HOSPITAL_COMMUNITY)
Admission: EM | Admit: 2014-02-07 | Discharge: 2014-02-07 | Disposition: A | Discharge status: Home / Self Care | Payer: Self-pay | Attending: Emergency Medicine | Admitting: Emergency Medicine

## 2014-02-07 ENCOUNTER — Encounter (HOSPITAL_COMMUNITY): Payer: Self-pay | Admitting: Emergency Medicine

## 2014-02-07 ENCOUNTER — Emergency Department (HOSPITAL_COMMUNITY): Payer: Self-pay

## 2014-02-07 DIAGNOSIS — K219 Gastro-esophageal reflux disease without esophagitis: Secondary | ICD-10-CM | POA: Insufficient documentation

## 2014-02-07 DIAGNOSIS — J45909 Unspecified asthma, uncomplicated: Secondary | ICD-10-CM | POA: Insufficient documentation

## 2014-02-07 DIAGNOSIS — R1012 Left upper quadrant pain: Secondary | ICD-10-CM | POA: Insufficient documentation

## 2014-02-07 DIAGNOSIS — Z720 Tobacco use: Secondary | ICD-10-CM | POA: Insufficient documentation

## 2014-02-07 DIAGNOSIS — R1013 Epigastric pain: Secondary | ICD-10-CM | POA: Insufficient documentation

## 2014-02-07 DIAGNOSIS — K861 Other chronic pancreatitis: Secondary | ICD-10-CM | POA: Insufficient documentation

## 2014-02-07 DIAGNOSIS — I10 Essential (primary) hypertension: Secondary | ICD-10-CM | POA: Insufficient documentation

## 2014-02-07 DIAGNOSIS — F329 Major depressive disorder, single episode, unspecified: Secondary | ICD-10-CM | POA: Insufficient documentation

## 2014-02-07 DIAGNOSIS — D649 Anemia, unspecified: Secondary | ICD-10-CM | POA: Insufficient documentation

## 2014-02-07 DIAGNOSIS — R109 Unspecified abdominal pain: Secondary | ICD-10-CM

## 2014-02-07 DIAGNOSIS — G8929 Other chronic pain: Secondary | ICD-10-CM | POA: Insufficient documentation

## 2014-02-07 DIAGNOSIS — R011 Cardiac murmur, unspecified: Secondary | ICD-10-CM | POA: Insufficient documentation

## 2014-02-07 DIAGNOSIS — F419 Anxiety disorder, unspecified: Secondary | ICD-10-CM | POA: Insufficient documentation

## 2014-02-07 DIAGNOSIS — G43909 Migraine, unspecified, not intractable, without status migrainosus: Secondary | ICD-10-CM | POA: Insufficient documentation

## 2014-02-07 DIAGNOSIS — Z79899 Other long term (current) drug therapy: Secondary | ICD-10-CM | POA: Insufficient documentation

## 2014-02-07 DIAGNOSIS — Z8739 Personal history of other diseases of the musculoskeletal system and connective tissue: Secondary | ICD-10-CM | POA: Insufficient documentation

## 2014-02-07 DIAGNOSIS — R748 Abnormal levels of other serum enzymes: Secondary | ICD-10-CM | POA: Insufficient documentation

## 2014-02-07 LAB — COMPREHENSIVE METABOLIC PANEL
ALT: 52 U/L (ref 0–53)
AST: 43 U/L — ABNORMAL HIGH (ref 0–37)
Albumin: 4 g/dL (ref 3.5–5.2)
Alkaline Phosphatase: 140 U/L — ABNORMAL HIGH (ref 39–117)
Anion gap: 18 — ABNORMAL HIGH (ref 5–15)
BUN: 9 mg/dL (ref 6–23)
CALCIUM: 9.2 mg/dL (ref 8.4–10.5)
CO2: 21 mEq/L (ref 19–32)
Chloride: 100 mEq/L (ref 96–112)
Creatinine, Ser: 0.63 mg/dL (ref 0.50–1.35)
GFR calc non Af Amer: >90 mL/min (ref >90)
GLUCOSE: 90 mg/dL (ref 70–99)
Potassium: 4.2 mEq/L (ref 3.7–5.3)
Sodium: 139 mEq/L (ref 137–147)
TOTAL PROTEIN: 8.9 g/dL — AB (ref 6.0–8.3)
Total Bilirubin: 0.4 mg/dL (ref 0.3–1.2)

## 2014-02-07 LAB — CBC WITH DIFFERENTIAL/PLATELET
Basophils Absolute: 0 10*3/uL (ref 0.0–0.1)
Basophils Relative: 0 % (ref 0–1)
EOS ABS: 0.3 10*3/uL (ref 0.0–0.7)
EOS PCT: 4 % (ref 0–5)
HCT: 37.2 % — ABNORMAL LOW (ref 39.0–52.0)
HEMOGLOBIN: 12.8 g/dL — AB (ref 13.0–17.0)
LYMPHS ABS: 2.7 10*3/uL (ref 0.7–4.0)
Lymphocytes Relative: 33 % (ref 12–46)
MCH: 31.4 pg (ref 26.0–34.0)
MCHC: 34.4 g/dL (ref 30.0–36.0)
MCV: 91.2 fL (ref 78.0–100.0)
MONOS PCT: 9 % (ref 3–12)
Monocytes Absolute: 0.8 10*3/uL (ref 0.1–1.0)
Neutro Abs: 4.6 10*3/uL (ref 1.7–7.7)
Neutrophils Relative %: 54 % (ref 43–77)
Platelets: 339 10*3/uL (ref 150–400)
RBC: 4.08 MIL/uL — AB (ref 4.22–5.81)
RDW: 17.4 % — ABNORMAL HIGH (ref 11.5–15.5)
WBC: 8.4 10*3/uL (ref 4.0–10.5)

## 2014-02-07 LAB — TROPONIN I

## 2014-02-07 LAB — LIPASE, BLOOD: Lipase: 67 U/L — ABNORMAL HIGH (ref 11–59)

## 2014-02-07 MED ORDER — FENTANYL CITRATE 0.05 MG/ML IJ SOLN
100.0000 ug | Freq: Once | INTRAMUSCULAR | Status: AC
  Administered 2014-02-07: 100 ug via INTRAVENOUS
  Filled 2014-02-07: qty 2

## 2014-02-07 MED ORDER — MORPHINE SULFATE 4 MG/ML IJ SOLN: 4.0000 mg | Freq: Once | INTRAMUSCULAR | Status: DC

## 2014-02-07 MED ORDER — PROCHLORPERAZINE EDISYLATE 5 MG/ML IJ SOLN
10.0000 mg | Freq: Once | INTRAMUSCULAR | Status: AC
  Administered 2014-02-07: 10 mg via INTRAVENOUS
  Filled 2014-02-07: qty 2

## 2014-02-07 MED ORDER — MORPHINE SULFATE 4 MG/ML IJ SOLN
4.0000 mg | Freq: Once | INTRAMUSCULAR | Status: AC
  Administered 2014-02-07: 4 mg via INTRAVENOUS
  Filled 2014-02-07: qty 1

## 2014-02-07 MED ORDER — LORAZEPAM 2 MG/ML IJ SOLN
1.0000 mg | Freq: Once | INTRAMUSCULAR | Status: AC
  Administered 2014-02-07: 1 mg via INTRAVENOUS
  Filled 2014-02-07: qty 1

## 2014-02-07 MED ORDER — SODIUM CHLORIDE 0.9 % IV BOLUS (SEPSIS)
1000.0000 mL | Freq: Once | INTRAVENOUS | Status: AC
  Administered 2014-02-07: 1000 mL via INTRAVENOUS

## 2014-02-07 MED ORDER — GI COCKTAIL ~~LOC~~
30.0000 mL | Freq: Once | ORAL | Status: AC
  Administered 2014-02-07: 30 mL via ORAL
  Filled 2014-02-07: qty 30

## 2014-02-07 MED ORDER — DIPHENHYDRAMINE HCL 50 MG/ML IJ SOLN
25.0000 mg | Freq: Once | INTRAMUSCULAR | Status: AC
  Administered 2014-02-07: 25 mg via INTRAVENOUS
  Filled 2014-02-07: qty 1

## 2014-02-07 NOTE — ED Notes (Signed)
[facility] presents with a 43 yo male from home with left lower and upper quadrant abdominal pain.  Pt has hx of abdominal pain and pancreatitis and was last admitted/discharged from this facility on last Sunday for same.  Last consumption of alcohol pt states about 10 days ago.  Pt has had diarrhea approximately 15 episodes of diarrhea and 3 episodes of vomiting in past 24 hours as reported by the patient.  No SOB and No CP.  CBG 119.  VS stable.

## 2014-02-07 NOTE — Progress Notes (Signed)
Note/chart reviewed.  Katie Dontrez Pettis, RD, LDN Pager #: [phone] After-Hours Pager #: [phone]  

## 2014-02-07 NOTE — ED Provider Notes (Signed)
Medical screening examination/treatment/procedure(s) were performed by non-physician practitioner and as supervising physician I was immediately available for consultation/collaboration.   EKG Interpretation None        Wandra Arthurs, MD 02/07/14 [phone]

## 2014-02-07 NOTE — ED Provider Notes (Signed)
CSN: [id number]     Arrival date & time 02/07/14  1103 History   First MD Initiated Contact with Patient 02/07/14 1107     Chief Complaint  Patient presents with  . Abdominal Pain     (Consider location/radiation/quality/duration/timing/severity/associated sxs/prior Treatment) HPI  Eivin Mascio Edmondson is a 43 year old male with a past medical history of hypertension, asthma, alcohol abuse, cocaine abuse And chronic pancreatitis emergency department with chief complaint of abdominal pain, nausea vomiting and diarrhea. Patient has more than 20 visits in the past 6 months to the emergency department. He is here with the same complaint 2 days ago. Review of chart shows a followup visit with his primary care doctor yesterday without complaint of abdominal pain. Patient states that last night he began having severe epigastric abdominal pain, multiple episodes of watery diarrhea. He denies any melena or hematochezia. The patient complains also of nausea and vomiting nonbloody nonbilious vomitus. He states that his last alcohol intake was more than 10 days ago and he denies any illicit drug use currently. He has been taking his Creon as directed and has oxycodone and Phenergan at home for symptoms which have not controlled his pain is he has also not been able to hold them down. He denies chest pain, shortness of breath, diaphoresis, fevers.   Past Medical History  Diagnosis Date  . Hypertension   . Asthma   . Pancreatitis   . Cocaine abuse   . Depression   . H/O suicide attempt   . Heart murmur     "when he was little" (03/06/2013)  . Shortness of breath     "can happen at anytime" (03/06/2013)  . Anemia   . H/O hiatal hernia   . GERD (gastroesophageal reflux disease)   . Anxiety   . WPW (Wolff-Parkinson-White syndrome)     [name] 03/06/2013  . High cholesterol   . Migraine     "monthly" (01/30/2014)  . Arthritis     "knees; arms; elbows" (01/30/2014)   Past Surgical History  Procedure  Laterality Date  . Facial fracture surgery Left 1990's    "result of trauma"   . Eye surgery Left 1990's    "result of trauma"    Family History  Problem Relation Age of Onset  . Hypertension Other   . Coronary artery disease Other    History  Substance Use Topics  . Smoking status: Current Every Day Smoker -- 1.00 packs/day for 30 years    Types: Cigarettes  . Smokeless tobacco: Current User    Types: Chew  . Alcohol Use: 28.2 oz/week    47 Cans of beer per week     Comment: 01/16/23/2015 "I drink 2, 40oz  beers per day"    Review of Systems  Ten systems reviewed and are negative for acute change, except as noted in the HPI.    Allergies  Shellfish-derived products and Trazodone and nefazodone  Home Medications   Prior to Admission medications   Medication Sig Start Date End Date Taking? Authorizing Provider  albuterol (PROVENTIL HFA;VENTOLIN HFA) 108 (90 BASE) MCG/ACT inhaler Inhale 2 puffs into the lungs every 6 (six) hours as needed for wheezing or shortness of breath.   Yes Historical Provider, MD  folic acid (FOLVITE) 1 MG tablet Take 1 tablet (1 mg total) by mouth daily. 11/26/13  Yes Shanker Kristeen Mans, MD  hydrOXYzine (ATARAX/VISTARIL) 25 MG tablet Take 25 mg by mouth every 6 (six) hours as needed for anxiety.   Yes Historical  Provider, MD  lactose free nutrition (BOOST) LIQD Take 237 mLs by mouth 2 (two) times daily.   Yes Historical Provider, MD  lipase/protease/amylase (CREON) 12000 UNITS CPEP capsule Take 2 capsules (24,000 Units total) by mouth 3 (three) times daily with meals. 02/06/14  Yes [name], MD  loperamide (IMODIUM) 2 MG capsule Take 1-2 capsules (2-4 mg total) by mouth as needed for diarrhea or loose stools. 01/23/14  Yes [name], PA-C  metoprolol tartrate (LOPRESSOR) 25 MG tablet Take 1 tablet (25 mg total) by mouth 2 (two) times daily. 02/06/14  Yes [name], MD  Multiple Vitamin (MULTIVITAMIN WITH MINERALS) TABS tablet Take 1  tablet by mouth daily. For low vitamin 11/28/13  Yes [name], MD  Multiple Vitamin (MULTIVITAMIN) capsule Take 1 capsule by mouth daily. 02/06/14  Yes [name], MD  oxyCODONE-acetaminophen (PERCOCET/ROXICET) 5-325 MG per tablet Take 1-2 tablets by mouth every 4 (four) hours as needed for moderate pain or severe pain. 02/02/14  Yes Ripudeep Krystal Eaton, MD  pantoprazole (PROTONIX) 40 MG tablet Take 1 tablet (40 mg total) by mouth 2 (two) times daily. 11/28/13  Yes [name], MD  promethazine (PHENERGAN) 25 MG tablet Take 1 tablet (25 mg total) by mouth every 6 (six) hours as needed for nausea or vomiting. 02/02/14  Yes Ripudeep Krystal Eaton, MD  QUEtiapine (SEROQUEL) 100 MG tablet Take 1 tablet (100 mg total) by mouth at bedtime. For mood control 01/15/14  Yes Shanker Kristeen Mans, MD  sertraline (ZOLOFT) 25 MG tablet Take 1 tablet (25 mg total) by mouth daily. 02/06/14  Yes [name], MD  thiamine 100 MG tablet Take 100 mg by mouth daily.    Yes Historical Provider, MD  traMADol (ULTRAM) 50 MG tablet Take 1 tablet (50 mg total) by mouth every 12 (twelve) hours as needed for moderate pain. 02/06/14   Olugbemiga E Doreene Burke, MD   BP 130/82  Pulse 105  Temp(Src) 98.4 F (36.9 C) (Oral)  Resp 12  SpO2 100% Physical Exam Physical Exam  Nursing note and vitals reviewed. Constitutional: He appears well-developed and well-nourished. No distress.  HENT:  Head: Normocephalic and atraumatic.  Eyes: Conjunctivae normal are normal. No scleral icterus.  Neck: Normal range of motion. Neck supple.  Cardiovascular: Tachycardic rate, regular rhythm and normal heart sounds.   Pulmonary/Chest: Effort normal and breath sounds normal. No respiratory distress.  Abdominal: Soft. There is tenderness to palpation of the epigastrium and left upper quadrant of the abdomen. No rebound, no guarding.  Musculoskeletal: He exhibits no edema.  Neurological: He is alert.  Skin: Skin is warm and dry. He is  not diaphoretic.  Psychiatric: His behavior is normal.     ED Course  Procedures (including critical care time) Labs Review Labs Reviewed  CBC WITH DIFFERENTIAL - Abnormal; Notable for the following:    RBC 4.08 (*)    Hemoglobin 12.8 (*)    HCT 37.2 (*)    RDW 17.4 (*)    All other components within normal limits  COMPREHENSIVE METABOLIC PANEL - Abnormal; Notable for the following:    Total Protein 8.9 (*)    AST 43 (*)    Alkaline Phosphatase 140 (*)    Anion gap 18 (*)    All other components within normal limits  LIPASE, BLOOD - Abnormal; Notable for the following:    Lipase 67 (*)    All other components within normal limits    Imaging Review  No results found.   EKG Interpretation   Date/Time:  Friday February 07 2014 12:31:51 EDT Ventricular Rate:  101 PR Interval:  106 QRS Duration: 64 QT Interval:  302 QTC Calculation: 391 R Axis:   69 Text Interpretation:  Sinus tachycardia Left atrial enlargement Abnormal  T, consider ischemia, diffuse leads Baseline wander in lead(s) V6 ED  PHYSICIAN INTERPRETATION AVAILABLE IN [facility] Confirmed by TEST,  Record ([id number]) on 02/09/2014 10:48:58 AM      MDM   Final diagnoses:  Chronic abdominal pain     BP 141/88  Pulse 103  Temp(Src) 98.4 F (36.9 C) (Oral)  Resp 16  SpO2 100% Patient  With stable anemia. Here for chronic abdominal pain. Lipase is slightly elevated, Negative troponins, ekg unchanged, Pain treated and he he is tolerating POs. Pain meds and antiemetics at home.  Patient appears appropriate for discharge and f/u with pcp. The patient appears reasonably screened and/or stabilized for discharge and I doubt any other medical condition or other [facility] requiring further screening, evaluation, or treatment in the ED at this time prior to discharge.     Margarita Mail, PA-C 02/11/14 1209

## 2014-02-07 NOTE — Discharge Instructions (Signed)
Abdominal (belly) pain can be caused by many things. Your caregiver performed an examination and possibly ordered blood/urine tests and imaging (CT scan, x-rays, ultrasound). Many cases can be observed and treated at home after initial evaluation in the emergency department. Even though you are being discharged home, abdominal pain can be unpredictable. Therefore, you need a repeated exam if your pain does not resolve, returns, or worsens. Most patients with abdominal pain don't have to be admitted to the hospital or have surgery, but serious problems like appendicitis and gallbladder attacks can start out as nonspecific pain. Many abdominal conditions cannot be diagnosed in one visit, so follow-up evaluations are very important. °SEEK IMMEDIATE MEDICAL ATTENTION IF: °The pain does not go away or becomes severe.  °A temperature above 101 develops.  °Repeated vomiting occurs (multiple episodes).  °The pain becomes localized to portions of the abdomen. The right side could possibly be appendicitis. In an adult, the left lower portion of the abdomen could be colitis or diverticulitis.  °Blood is being passed in stools or vomit (bright red or black tarry stools).  °Return also if you develop chest pain, difficulty breathing, dizziness or fainting, or become confused, poorly responsive, or inconsolable (young children). ° °

## 2014-02-07 NOTE — ED Notes (Signed)
[name] in main lab will add troponin to blood work in lab.

## 2014-02-10 ENCOUNTER — Emergency Department (HOSPITAL_COMMUNITY)
Admission: EM | Admit: 2014-02-10 | Discharge: 2014-02-10 | Disposition: A | Discharge status: Home / Self Care | Payer: Self-pay | Attending: Emergency Medicine | Admitting: Emergency Medicine

## 2014-02-10 ENCOUNTER — Encounter (HOSPITAL_COMMUNITY): Payer: Self-pay | Admitting: Emergency Medicine

## 2014-02-10 ENCOUNTER — Inpatient Hospital Stay: Payer: Self-pay | Admitting: Internal Medicine

## 2014-02-10 DIAGNOSIS — Z79899 Other long term (current) drug therapy: Secondary | ICD-10-CM | POA: Insufficient documentation

## 2014-02-10 DIAGNOSIS — K219 Gastro-esophageal reflux disease without esophagitis: Secondary | ICD-10-CM | POA: Insufficient documentation

## 2014-02-10 DIAGNOSIS — K852 Alcohol induced acute pancreatitis without necrosis or infection: Secondary | ICD-10-CM

## 2014-02-10 DIAGNOSIS — I10 Essential (primary) hypertension: Secondary | ICD-10-CM | POA: Insufficient documentation

## 2014-02-10 DIAGNOSIS — R011 Cardiac murmur, unspecified: Secondary | ICD-10-CM | POA: Insufficient documentation

## 2014-02-10 DIAGNOSIS — Z720 Tobacco use: Secondary | ICD-10-CM | POA: Insufficient documentation

## 2014-02-10 DIAGNOSIS — Z8639 Personal history of other endocrine, nutritional and metabolic disease: Secondary | ICD-10-CM | POA: Insufficient documentation

## 2014-02-10 DIAGNOSIS — Z8739 Personal history of other diseases of the musculoskeletal system and connective tissue: Secondary | ICD-10-CM | POA: Insufficient documentation

## 2014-02-10 DIAGNOSIS — J45909 Unspecified asthma, uncomplicated: Secondary | ICD-10-CM | POA: Insufficient documentation

## 2014-02-10 DIAGNOSIS — Z8669 Personal history of other diseases of the nervous system and sense organs: Secondary | ICD-10-CM | POA: Insufficient documentation

## 2014-02-10 DIAGNOSIS — F419 Anxiety disorder, unspecified: Secondary | ICD-10-CM | POA: Insufficient documentation

## 2014-02-10 DIAGNOSIS — F329 Major depressive disorder, single episode, unspecified: Secondary | ICD-10-CM | POA: Insufficient documentation

## 2014-02-10 DIAGNOSIS — D649 Anemia, unspecified: Secondary | ICD-10-CM | POA: Insufficient documentation

## 2014-02-10 LAB — CBC
HCT: 38.3 % — ABNORMAL LOW (ref 39.0–52.0)
Hemoglobin: 13.1 g/dL (ref 13.0–17.0)
MCH: 31.1 pg (ref 26.0–34.0)
MCHC: 34.2 g/dL (ref 30.0–36.0)
MCV: 91 fL (ref 78.0–100.0)
Platelets: 326 10*3/uL (ref 150–400)
RBC: 4.21 MIL/uL — ABNORMAL LOW (ref 4.22–5.81)
RDW: 17 % — AB (ref 11.5–15.5)
WBC: 6.1 10*3/uL (ref 4.0–10.5)

## 2014-02-10 LAB — COMPREHENSIVE METABOLIC PANEL
ALBUMIN: 4.2 g/dL (ref 3.5–5.2)
ALT: 49 U/L (ref 0–53)
AST: 44 U/L — AB (ref 0–37)
Alkaline Phosphatase: 140 U/L — ABNORMAL HIGH (ref 39–117)
Anion gap: 17 — ABNORMAL HIGH (ref 5–15)
BILIRUBIN TOTAL: 0.5 mg/dL (ref 0.3–1.2)
BUN: 8 mg/dL (ref 6–23)
CHLORIDE: 101 meq/L (ref 96–112)
CO2: 24 mEq/L (ref 19–32)
Calcium: 9.5 mg/dL (ref 8.4–10.5)
Creatinine, Ser: 0.61 mg/dL (ref 0.50–1.35)
GFR calc Af Amer: >90 mL/min (ref >90)
GFR calc non Af Amer: >90 mL/min (ref >90)
Glucose, Bld: 104 mg/dL — ABNORMAL HIGH (ref 70–99)
POTASSIUM: 3.9 meq/L (ref 3.7–5.3)
Sodium: 142 mEq/L (ref 137–147)
Total Protein: 9 g/dL — ABNORMAL HIGH (ref 6.0–8.3)

## 2014-02-10 LAB — LIPASE, BLOOD: LIPASE: 74 U/L — AB (ref 11–59)

## 2014-02-10 LAB — ETHANOL

## 2014-02-10 MED ORDER — SODIUM CHLORIDE 0.9 % IV BOLUS (SEPSIS)
500.0000 mL | Freq: Once | INTRAVENOUS | Status: AC
  Administered 2014-02-10: 500 mL via INTRAVENOUS

## 2014-02-10 MED ORDER — GI COCKTAIL ~~LOC~~
30.0000 mL | Freq: Once | ORAL | Status: AC
  Administered 2014-02-10: 30 mL via ORAL
  Filled 2014-02-10: qty 30

## 2014-02-10 MED ORDER — HYDROCODONE-ACETAMINOPHEN 5-325 MG PO TABS: 1.0000 | ORAL_TABLET | Freq: Four times a day (QID) | ORAL | Status: DC | PRN

## 2014-02-10 MED ORDER — ONDANSETRON HCL 4 MG/2ML IJ SOLN
4.0000 mg | Freq: Once | INTRAMUSCULAR | Status: AC
  Administered 2014-02-10: 4 mg via INTRAVENOUS
  Filled 2014-02-10: qty 2

## 2014-02-10 MED ORDER — FAMOTIDINE 20 MG PO TABS
20.0000 mg | ORAL_TABLET | Freq: Once | ORAL | Status: AC
  Administered 2014-02-10: 20 mg via ORAL
  Filled 2014-02-10: qty 1

## 2014-02-10 MED ORDER — HYDROMORPHONE HCL 1 MG/ML IJ SOLN
1.0000 mg | Freq: Once | INTRAMUSCULAR | Status: AC
  Administered 2014-02-10: 1 mg via INTRAVENOUS
  Filled 2014-02-10: qty 1

## 2014-02-10 MED ORDER — HYDROMORPHONE HCL 1 MG/ML IJ SOLN
0.5000 mg | Freq: Once | INTRAMUSCULAR | Status: AC
  Administered 2014-02-10: 0.5 mg via INTRAVENOUS
  Filled 2014-02-10: qty 1

## 2014-02-10 MED ORDER — HYDROMORPHONE HCL 1 MG/ML IJ SOLN
1.0000 mg | Freq: Once | INTRAMUSCULAR | Status: AC
Start: 2014-02-10 — End: 2014-02-10
  Administered 2014-02-10: 1 mg via INTRAVENOUS
  Filled 2014-02-10: qty 1

## 2014-02-10 MED ORDER — PANTOPRAZOLE SODIUM 40 MG IV SOLR
40.0000 mg | Freq: Once | INTRAVENOUS | Status: AC
  Administered 2014-02-10: 40 mg via INTRAVENOUS
  Filled 2014-02-10: qty 40

## 2014-02-10 NOTE — Discharge Instructions (Signed)
It was our pleasure to provide your ER care today - we hope that you feel better.  Rest. Drink plenty of fluids.  Clear liquid diet for the next day, then advance as tolerated.  Avoid any alcohol use. Continue protonix. You may take hydrocodone as need for pain. No driving  when taking hydrocodone. Also, do not take tylenol or acetaminophen containing medication when taking hydrocodone. Follow up with primary care doctor in coming week - also have your blood pressure rechecked then as it is high today.  You were given pain medication in the ER - no driving for the next 6 hours.       Acute Pancreatitis Acute pancreatitis is a disease in which the pancreas becomes suddenly inflamed. The pancreas is a large gland located behind your stomach. The pancreas produces enzymes that help digest food. The pancreas also releases the hormones glucagon and insulin that help regulate blood sugar. Damage to the pancreas occurs when the digestive enzymes from the pancreas are activated and begin attacking the pancreas before being released into the intestine. Most acute attacks last a couple of days and can cause serious complications. Some people become dehydrated and develop low blood pressure. In severe cases, bleeding into the pancreas can lead to shock and can be life-threatening. The lungs, heart, and kidneys may fail. CAUSES  Pancreatitis can happen to anyone. In some cases, the cause is unknown. Most cases are caused by:  Alcohol abuse.  Gallstones. Other less common causes are:  Certain medicines.  Exposure to certain chemicals.  Infection.  Damage caused by an accident (trauma).  Abdominal surgery. SYMPTOMS   Pain in the upper abdomen that may radiate to the back.  Tenderness and swelling of the abdomen.  Nausea and vomiting. DIAGNOSIS  Your caregiver will perform a physical exam. Blood and stool tests may be done to confirm the diagnosis. Imaging tests may also be done, such as  X-rays, CT scans, or an ultrasound of the abdomen. TREATMENT  Treatment usually requires a stay in the hospital. Treatment may include:  Pain medicine.  Fluid replacement through an intravenous line (IV).  Placing a tube in the stomach to remove stomach contents and control vomiting.  Not eating for 3 or 4 days. This gives your pancreas a rest, because enzymes are not being produced that can cause further damage.  Antibiotic medicines if your condition is caused by an infection.  Surgery of the pancreas or gallbladder. HOME CARE INSTRUCTIONS   Follow the diet advised by your caregiver. This may involve avoiding alcohol and decreasing the amount of fat in your diet.  Eat smaller, more frequent meals. This reduces the amount of digestive juices the pancreas produces.  Drink enough fluids to keep your urine clear or pale yellow.  Only take over-the-counter or prescription medicines as directed by your caregiver.  Avoid drinking alcohol if it caused your condition.  Do not smoke.  Get plenty of rest.  Check your blood sugar at home as directed by your caregiver.  Keep all follow-up appointments as directed by your caregiver. SEEK MEDICAL CARE IF:   You do not recover as quickly as expected.  You develop new or worsening symptoms.  You have persistent pain, weakness, or nausea.  You recover and then have another episode of pain. SEEK IMMEDIATE MEDICAL CARE IF:   You are unable to eat or keep fluids down.  Your pain becomes severe.  You have a fever or persistent symptoms for more than 2 to  3 days.  You have a fever and your symptoms suddenly get worse.  Your skin or the white part of your eyes turn yellow (jaundice).  You develop vomiting.  You feel dizzy, or you faint.  Your blood sugar is high (over 300 mg/dL). MAKE SURE YOU:   Understand these instructions.  Will watch your condition.  Will get help right away if you are not doing well or get  worse. Document Released: 04/25/2005 Document Revised: 10/25/2011 Document Reviewed: 08/04/2011 [facility] Patient Information 2015 [place], [place]. This information is not intended to replace advice given to you by your health care provider. Make sure you discuss any questions you have with your health care provider.     Gastritis, Adult Gastritis is soreness and swelling (inflammation) of the lining of the stomach. Gastritis can develop as a sudden onset (acute) or long-term (chronic) condition. If gastritis is not treated, it can lead to stomach bleeding and ulcers. CAUSES  Gastritis occurs when the stomach lining is weak or damaged. Digestive juices from the stomach then inflame the weakened stomach lining. The stomach lining may be weak or damaged due to viral or bacterial infections. One common bacterial infection is the Helicobacter pylori infection. Gastritis can also result from excessive alcohol consumption, taking certain medicines, or having too much acid in the stomach.  SYMPTOMS  In some cases, there are no symptoms. When symptoms are present, they may include:  Pain or a burning sensation in the upper abdomen.  Nausea.  Vomiting.  An uncomfortable feeling of fullness after eating. DIAGNOSIS  Your caregiver may suspect you have gastritis based on your symptoms and a physical exam. To determine the cause of your gastritis, your caregiver may perform the following:  Blood or stool tests to check for the H pylori bacterium.  Gastroscopy. A thin, flexible tube (endoscope) is passed down the esophagus and into the stomach. The endoscope has a light and camera on the end. Your caregiver uses the endoscope to view the inside of the stomach.  Taking a tissue sample (biopsy) from the stomach to examine under a microscope. TREATMENT  Depending on the cause of your gastritis, medicines may be prescribed. If you have a bacterial infection, such as an H pylori infection, antibiotics may  be given. If your gastritis is caused by too much acid in the stomach, H2 blockers or antacids may be given. Your caregiver may recommend that you stop taking aspirin, ibuprofen, or other nonsteroidal anti-inflammatory drugs (NSAIDs). HOME CARE INSTRUCTIONS  Only take over-the-counter or prescription medicines as directed by your caregiver.  If you were given antibiotic medicines, take them as directed. Finish them even if you start to feel better.  Drink enough fluids to keep your urine clear or pale yellow.  Avoid foods and drinks that make your symptoms worse, such as:  Caffeine or alcoholic drinks.  Chocolate.  Peppermint or mint flavorings.  Garlic and onions.  Spicy foods.  Citrus fruits, such as oranges, lemons, or limes.  Tomato-based foods such as sauce, chili, salsa, and pizza.  Fried and fatty foods.  Eat small, frequent meals instead of large meals. SEEK IMMEDIATE MEDICAL CARE IF:   You have black or dark red stools.  You vomit blood or material that looks like coffee grounds.  You are unable to keep fluids down.  Your abdominal pain gets worse.  You have a fever.  You do not feel better after 1 week.  You have any other questions or concerns. MAKE SURE YOU:  Understand these instructions.  Will watch your condition.  Will get help right away if you are not doing well or get worse. Document Released: 04/19/2001 Document Revised: 10/25/2011 Document Reviewed: 06/08/2011 [facility] Patient Information 2015 [place], [place]. This information is not intended to replace advice given to you by your health care provider. Make sure you discuss any questions you have with your health care provider.    Clear Liquid Diet A clear liquid diet is a short-term diet that is prescribed to provide the necessary fluid and basic energy you need when you can have nothing else. The clear liquid diet consists of liquids or solids that will become liquid at room temperature.  You should be able to see through the liquid. There are many reasons that you may be restricted to clear liquids, such as:  When you have a sudden-onset (acute) condition that occurs before or after surgery.  To help your body slowly get adjusted to food again after a long period when you were unable to have food.  Replacement of fluids when you have a diarrheal disease.  When you are going to have certain exams, such as a colonoscopy, in which instruments are inserted inside your body to look at parts of your digestive system. WHAT CAN I HAVE? A clear liquid diet does not provide all the nutrients you need. It is important to choose a variety of the following items to get as many nutrients as possible:  Vegetable juices that do not have pulp.  Fruit juices and fruit drinks that do not have pulp.  Coffee (regular or decaffeinated), tea, or soda at the discretion of your health care provider.  Clear bouillon, broth, or strained broth-based soups.  High-protein and flavored gelatins.  Sugar or honey.  Ices or frozen ice pops that do not contain milk. If you are not sure whether you can have certain items, you should ask your health care provider. You may also ask your health care provider if there are any other clear liquid options. Document Released: 04/25/2005 Document Revised: 04/30/2013 Document Reviewed: 03/22/2013 [facility] Patient Information 2015 [place], [place]. This information is not intended to replace advice given to you by your health care provider. Make sure you discuss any questions you have with your health care provider.    Hypertension Hypertension, commonly called high blood pressure, is when the force of blood pumping through your arteries is too strong. Your arteries are the blood vessels that carry blood from your heart throughout your body. A blood pressure reading consists of a higher number over a lower number, such as 110/72. The higher number (systolic) is the  pressure inside your arteries when your heart pumps. The lower number (diastolic) is the pressure inside your arteries when your heart relaxes. Ideally you want your blood pressure below 120/80. Hypertension forces your heart to work harder to pump blood. Your arteries may become narrow or stiff. Having hypertension puts you at risk for heart disease, stroke, and other problems.  RISK FACTORS Some risk factors for high blood pressure are controllable. Others are not.  Risk factors you cannot control include:   Race. You may be at higher risk if you are African American.  Age. Risk increases with age.  Gender. Men are at higher risk than women before age 52 years. After age 68, women are at higher risk than men. Risk factors you can control include:  Not getting enough exercise or physical activity.  Being overweight.  Getting too much fat, sugar, calories,  or salt in your diet.  Drinking too much alcohol. SIGNS AND SYMPTOMS Hypertension does not usually cause signs or symptoms. Extremely high blood pressure (hypertensive crisis) may cause headache, anxiety, shortness of breath, and nosebleed. DIAGNOSIS  To check if you have hypertension, your health care provider will measure your blood pressure while you are seated, with your arm held at the level of your heart. It should be measured at least twice using the same arm. Certain conditions can cause a difference in blood pressure between your right and left arms. A blood pressure reading that is higher than normal on one occasion does not mean that you need treatment. If one blood pressure reading is high, ask your health care provider about having it checked again. TREATMENT  Treating high blood pressure includes making lifestyle changes and possibly taking medicine. Living a healthy lifestyle can help lower high blood pressure. You may need to change some of your habits. Lifestyle changes may include:  Following the DASH diet. This diet  is high in fruits, vegetables, and whole grains. It is low in salt, red meat, and added sugars.  Getting at least 2 hours of brisk physical activity every week.  Losing weight if necessary.  Not smoking.  Limiting alcoholic beverages.  Learning ways to reduce stress. If lifestyle changes are not enough to get your blood pressure under control, your health care provider may prescribe medicine. You may need to take more than one. Work closely with your health care provider to understand the risks and benefits. HOME CARE INSTRUCTIONS  Have your blood pressure rechecked as directed by your health care provider.   Take medicines only as directed by your health care provider. Follow the directions carefully. Blood pressure medicines must be taken as prescribed. The medicine does not work as well when you skip doses. Skipping doses also puts you at risk for problems.   Do not smoke.   Monitor your blood pressure at home as directed by your health care provider. SEEK MEDICAL CARE IF:   You think you are having a reaction to medicines taken.  You have recurrent headaches or feel dizzy.  You have swelling in your ankles.  You have trouble with your vision. SEEK IMMEDIATE MEDICAL CARE IF:  You develop a severe headache or confusion.  You have unusual weakness, numbness, or feel faint.  You have severe chest or abdominal pain.  You vomit repeatedly.  You have trouble breathing. MAKE SURE YOU:   Understand these instructions.  Will watch your condition.  Will get help right away if you are not doing well or get worse. Document Released: 04/25/2005 Document Revised: 09/09/2013 Document Reviewed: 02/15/2013 [facility] Patient Information 2015 [place], [place]. This information is not intended to replace advice given to you by your health care provider. Make sure you discuss any questions you have with your health care provider.    Smoking Hazards Smoking cigarettes is  extremely bad for your health. Tobacco smoke has over 200 known poisons in it. It contains the poisonous gases nitrogen oxide and carbon monoxide. There are over 60 chemicals in tobacco smoke that cause cancer. Some of the chemicals found in cigarette smoke include:   Cyanide.   Benzene.   Formaldehyde.   Methanol (wood alcohol).   Acetylene (fuel used in welding torches).   Ammonia.  Even smoking lightly shortens your life expectancy by several years. You can greatly reduce the risk of medical problems for you and your family by stopping now. Smoking is  the most preventable cause of death and disease in our society. Within days of quitting smoking, your circulation improves, you decrease the risk of having a heart attack, and your lung capacity improves. There may be some increased phlegm in the first few days after quitting, and it may take months for your lungs to clear up completely. Quitting for 10 years reduces your risk of developing lung cancer to almost that of a nonsmoker.  WHAT ARE THE RISKS OF SMOKING? Cigarette smokers have an increased risk of many serious medical problems, including:  Lung cancer.   Lung disease (such as pneumonia, bronchitis, and emphysema).   Heart attack and chest pain due to the heart not getting enough oxygen (angina).   Heart disease and peripheral blood vessel disease.   Hypertension.   Stroke.   Oral cancer (cancer of the lip, mouth, or voice box).   Bladder cancer.   Pancreatic cancer.   Cervical cancer.   Pregnancy complications, including premature birth.   Stillbirths and smaller newborn babies, birth defects, and genetic damage to sperm.   Early menopause.   Lower estrogen level for women.   Infertility.   Facial wrinkles.   Blindness.   Increased risk of broken bones (fractures).   Senile dementia.   Stomach ulcers and internal bleeding.   Delayed wound healing and increased risk of  complications during surgery. Because of secondhand smoke exposure, children of smokers have an increased risk of the following:   Sudden infant death syndrome (SIDS).   Respiratory infections.   Lung cancer.   Heart disease.   Ear infections.  WHY IS SMOKING ADDICTIVE? Nicotine is the chemical agent in tobacco that is capable of causing addiction or dependence. When you smoke and inhale, nicotine is absorbed rapidly into the bloodstream through your lungs. Both inhaled and noninhaled nicotine may be addictive.  WHAT ARE THE BENEFITS OF QUITTING?  There are many health benefits to quitting smoking. Some are:   The likelihood of developing cancer and heart disease decreases. Health improvements are seen almost immediately.   Blood pressure, pulse rate, and breathing patterns start returning to normal soon after quitting.   People who quit may see an improvement in their overall quality of life.  HOW DO YOU QUIT SMOKING? Smoking is an addiction with both physical and psychological effects, and longtime habits can be hard to change. Your health care provider can recommend:  Programs and community resources, which may include group support, education, or therapy.  Replacement products, such as patches, gum, and nasal sprays. Use these products only as directed. Do not replace cigarette smoking with electronic cigarettes (commonly called e-cigarettes). The safety of e-cigarettes is unknown, and some may contain harmful chemicals. FOR MORE INFORMATION  American Lung Association: www.lung.org  American Cancer Society: www.cancer.org Document Released: 06/02/2004 Document Revised: 02/13/2013 Document Reviewed: 10/15/2012 [facility] Patient Information 2015 [place], [place]. This information is not intended to replace advice given to you by your health care provider. Make sure you discuss any questions you have with your health care provider.

## 2014-02-10 NOTE — ED Provider Notes (Signed)
CSN: [id number]     Arrival date & time 02/10/14  0867 History   First MD Initiated Contact with Patient 02/10/14 0840     Chief Complaint  Patient presents with  . Abdominal Pain     (Consider location/radiation/quality/duration/timing/severity/associated sxs/prior Treatment) Patient is a 43 y.o. male presenting with abdominal pain. The history is provided by the patient.  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 Pain dull, moderate-severe, constant, non radiating, c/w his prior pancreatitis pain. Nausea. No vomiting. +etoh use last night. Denies hx pud or gallstones. No prior abd surgery. No abd distension, is having regular bms. No fever or chills. No cp or sob.      Past Medical History  Diagnosis Date  . Hypertension   . Asthma   . Pancreatitis   . Cocaine abuse   . Depression   . H/O suicide attempt   . Heart murmur     "when he was little" (03/06/2013)  . Shortness of breath     "can happen at anytime" (03/06/2013)  . Anemia   . H/O hiatal hernia   . GERD (gastroesophageal reflux disease)   . Anxiety   . WPW (Wolff-Parkinson-White syndrome)     [name] 03/06/2013  . High cholesterol   . Migraine     "monthly" (01/30/2014)  . Arthritis     "knees; arms; elbows" (01/30/2014)   Past Surgical History  Procedure Laterality Date  . Facial fracture surgery Left 1990's    "result of trauma"   . Eye surgery Left 1990's    "result of trauma"    Family History  Problem Relation Age of Onset  . Hypertension Other   . Coronary artery disease Other    History  Substance Use Topics  . Smoking status: Current Every Day Smoker -- 1.00 packs/day for 30 years    Types: Cigarettes  . Smokeless tobacco: Current User    Types: Chew  . Alcohol Use: 28.2  oz/week    47 Cans of beer per week     Comment: 01/16/23/2015 "I drink 2, 40oz  beers per day"    Review of Systems  Constitutional: Negative for fever.  HENT: Negative for sore throat.   Eyes: Negative for redness.  Respiratory: Negative for cough and shortness of breath.   Cardiovascular: Negative for chest pain.  Gastrointestinal: Positive for nausea and abdominal pain. Negative for diarrhea and constipation.  Endocrine: Negative for polyuria.  Genitourinary: Negative for flank pain.  Musculoskeletal: Negative for back pain and neck pain.  Skin: Negative for rash.  Neurological: Negative for headaches.  Hematological: Does not bruise/bleed easily.  Psychiatric/Behavioral: Negative for confusion.      Allergies  Shellfish-derived products and Trazodone and nefazodone  Home Medications   Prior to Admission medications   Medication Sig Start Date End Date Taking? Authorizing Provider  albuterol (PROVENTIL HFA;VENTOLIN HFA) 108 (90 BASE) MCG/ACT inhaler Inhale 2 puffs into the lungs every 6 (six) hours as needed for wheezing or shortness of breath.    Historical Provider, MD  folic acid (FOLVITE) 1 MG tablet Take 1 tablet (1 mg total) by mouth daily. 11/26/13   Shanker Kristeen Mans, MD  hydrOXYzine (ATARAX/VISTARIL) 25 MG tablet Take 25 mg by mouth every 6 (six) hours as needed for anxiety.  Historical Provider, MD  lactose free nutrition (BOOST) LIQD Take 237 mLs by mouth 2 (two) times daily.    Historical Provider, MD  lipase/protease/amylase (CREON) 12000 UNITS CPEP capsule Take 2 capsules (24,000 Units total) by mouth 3 (three) times daily with meals. 02/06/14   [name], MD  loperamide (IMODIUM) 2 MG capsule Take 1-2 capsules (2-4 mg total) by mouth as needed for diarrhea or loose stools. 01/23/14   [name], PA-C  metoprolol tartrate (LOPRESSOR) 25 MG tablet Take 1 tablet (25 mg total) by mouth 2 (two) times daily. 02/06/14   [name], MD  Multiple  Vitamin (MULTIVITAMIN WITH MINERALS) TABS tablet Take 1 tablet by mouth daily. For low vitamin 11/28/13   [name], MD  Multiple Vitamin (MULTIVITAMIN) capsule Take 1 capsule by mouth daily. 02/06/14   [name], MD  oxyCODONE-acetaminophen (PERCOCET/ROXICET) 5-325 MG per tablet Take 1-2 tablets by mouth every 4 (four) hours as needed for moderate pain or severe pain. 02/02/14   Ripudeep Krystal Eaton, MD  pantoprazole (PROTONIX) 40 MG tablet Take 1 tablet (40 mg total) by mouth 2 (two) times daily. 11/28/13   [name], MD  promethazine (PHENERGAN) 25 MG tablet Take 1 tablet (25 mg total) by mouth every 6 (six) hours as needed for nausea or vomiting. 02/02/14   Ripudeep Krystal Eaton, MD  QUEtiapine (SEROQUEL) 100 MG tablet Take 1 tablet (100 mg total) by mouth at bedtime. For mood control 01/15/14   Jonetta Osgood, MD  sertraline (ZOLOFT) 25 MG tablet Take 1 tablet (25 mg total) by mouth daily. 02/06/14   [name], MD  thiamine 100 MG tablet Take 100 mg by mouth daily.     Historical Provider, MD  traMADol (ULTRAM) 50 MG tablet Take 1 tablet (50 mg total) by mouth every 12 (twelve) hours as needed for moderate pain. 02/06/14   [name], MD   BP 145/110  Pulse 95  Temp(Src) 98.4 F (36.9 C) (Oral)  Resp 18  Ht 5\' 9"  (1.753 m)  Wt 145 lb (65.772 kg)  BMI 21.40 kg/m2  SpO2 100% Physical Exam  Nursing note and vitals reviewed. Constitutional: He is oriented to person, place, and time. He appears well-developed and well-nourished. No distress.  HENT:  Head: Atraumatic.  Mouth/Throat: Oropharynx is clear and moist.  Eyes: Conjunctivae are normal. No scleral icterus.  Neck: Neck supple. No tracheal deviation present.  Cardiovascular: Normal rate, regular rhythm, normal heart sounds and intact distal pulses.   Pulmonary/Chest: Effort normal and breath sounds normal. No accessory muscle usage. No respiratory distress.  Abdominal: Soft. Bowel sounds are normal. He  exhibits no distension and no mass. There is tenderness. There is no rebound and no guarding.  Epigastric tenderness, no rebound or guarding.   Genitourinary:  No cva tenderness  Musculoskeletal: Normal range of motion. He exhibits no edema and no tenderness.  Neurological: He is alert and oriented to person, place, and time.  Skin: Skin is warm and dry.  Psychiatric: He has a normal mood and affect.    ED Course  Procedures (including critical care time) Labs Review  Results for orders placed during the hospital encounter of 02/10/14  CBC      Result Value Ref Range   WBC 6.1  4.0 - 10.5 K/uL   RBC 4.21 (*) 4.22 - 5.81 MIL/uL   Hemoglobin 13.1  13.0 - 17.0 g/dL   HCT 38.3 (*) 39.0 - 52.0 %  MCV 91.0  78.0 - 100.0 fL   MCH 31.1  26.0 - 34.0 pg   MCHC 34.2  30.0 - 36.0 g/dL   RDW 17.0 (*) 11.5 - 15.5 %   Platelets 326  150 - 400 K/uL  COMPREHENSIVE METABOLIC PANEL      Result Value Ref Range   Sodium 142  137 - 147 mEq/L   Potassium 3.9  3.7 - 5.3 mEq/L   Chloride 101  96 - 112 mEq/L   CO2 24  19 - 32 mEq/L   Glucose, Bld 104 (*) 70 - 99 mg/dL   BUN 8  6 - 23 mg/dL   Creatinine, Ser 0.61  0.50 - 1.35 mg/dL   Calcium 9.5  8.4 - 10.5 mg/dL   Total Protein 9.0 (*) 6.0 - 8.3 g/dL   Albumin 4.2  3.5 - 5.2 g/dL   AST 44 (*) 0 - 37 U/L   ALT 49  0 - 53 U/L   Alkaline Phosphatase 140 (*) 39 - 117 U/L   Total Bilirubin 0.5  0.3 - 1.2 mg/dL   GFR calc non Af Amer >90  >90 mL/min   GFR calc Af Amer >90  >90 mL/min   Anion gap 17 (*) 5 - 15  LIPASE, BLOOD      Result Value Ref Range   Lipase 74 (*) 11 - 59 U/L  ETHANOL      Result Value Ref Range   Alcohol, Ethyl (B) <11  0 - 11 mg/dL   Dg Abd Acute W/chest  02/07/2014   CLINICAL DATA:  Epigastric and LEFT abdominal pain  EXAM: ACUTE ABDOMEN SERIES (ABDOMEN 2 VIEW & CHEST 1 VIEW)  COMPARISON:  01/30/2014  FINDINGS: Normal heart size, mediastinal contours, and pulmonary vascularity.  Lungs clear.  No pleural effusion or  pneumothorax.  Nonobstructive bowel gas pattern.  No bowel dilatation or bowel wall thickening or free intraperitoneal air.  Bones unremarkable.  No urinary tract calcification.  IMPRESSION: Normal exam.   Electronically Signed   By: Lavonia Dana M.D.   On: 02/07/2014 13:24   Dg Abd Acute W/chest  01/30/2014   CLINICAL DATA:  Left lower quadrant and epigastric pain. Pancreatitis. Nausea and vomiting.  EXAM: ACUTE ABDOMEN SERIES (ABDOMEN 2 VIEW & CHEST 1 VIEW)  COMPARISON:  CT abdomen and pelvis 01/09/2014. Chest and two views abdomen 12/09/2013.  FINDINGS: Single view of the chest demonstrates clear lungs and normal heart size. No pneumothorax or pleural effusion.  Two views of the abdomen show no free intraperitoneal air. The bowel gas pattern is unremarkable. No abnormal abdominal calcification or focal bony abnormality is identified.  IMPRESSION: Negative examination.   Electronically Signed   By: Inge Rise M.D.   On: 01/30/2014 09:26      MDM   Iv ns bolus. protonix iv. Dilaudid 1 mg iv. zofran iv.  Reviewed nursing notes and prior charts for additional history.   Recheck pt appears more comfortable.  Pt requests gi cocktail and additional pain med.  pepcid and gi cocktail po.  Dilaudid 1 mg iv.  Recheck pt comfortable. Tolerating po fluids.  Pt appears stable for d/c.    Mirna Mires, MD 02/10/14 [phone]

## 2014-02-10 NOTE — ED Notes (Signed)
PT with hx of pancreatitis (was tx last week for same) to ED c/o diffuse abdominal pain.  Stated only drank 1 beer last night.

## 2014-02-11 NOTE — ED Provider Notes (Signed)
Medical screening examination/treatment/procedure(s) were performed by non-physician practitioner and as supervising physician I was immediately available for consultation/collaboration.   EKG Interpretation   Date/Time:  Friday February 07 2014 12:31:51 EDT Ventricular Rate:  101 PR Interval:  106 QRS Duration: 64 QT Interval:  302 QTC Calculation: 391 R Axis:   69 Text Interpretation:  Sinus tachycardia Left atrial enlargement Abnormal  T, consider ischemia, diffuse leads Baseline wander in lead(s) V6 ED  PHYSICIAN INTERPRETATION AVAILABLE IN [facility] Confirmed by TEST,  Record ([id number]) on 02/09/2014 10:48:58 AM        Wandra Arthurs, MD 02/11/14 [phone]

## 2014-02-12 ENCOUNTER — Encounter (HOSPITAL_COMMUNITY): Payer: Self-pay | Admitting: Emergency Medicine

## 2014-02-12 ENCOUNTER — Emergency Department (HOSPITAL_COMMUNITY)
Admission: EM | Admit: 2014-02-12 | Discharge: 2014-02-12 | Disposition: A | Discharge status: Home / Self Care | Payer: Self-pay | Attending: Emergency Medicine | Admitting: Emergency Medicine

## 2014-02-12 DIAGNOSIS — R109 Unspecified abdominal pain: Secondary | ICD-10-CM

## 2014-02-12 DIAGNOSIS — F419 Anxiety disorder, unspecified: Secondary | ICD-10-CM | POA: Insufficient documentation

## 2014-02-12 DIAGNOSIS — G43909 Migraine, unspecified, not intractable, without status migrainosus: Secondary | ICD-10-CM | POA: Insufficient documentation

## 2014-02-12 DIAGNOSIS — D649 Anemia, unspecified: Secondary | ICD-10-CM | POA: Insufficient documentation

## 2014-02-12 DIAGNOSIS — K861 Other chronic pancreatitis: Secondary | ICD-10-CM | POA: Insufficient documentation

## 2014-02-12 DIAGNOSIS — Z720 Tobacco use: Secondary | ICD-10-CM | POA: Insufficient documentation

## 2014-02-12 DIAGNOSIS — K219 Gastro-esophageal reflux disease without esophagitis: Secondary | ICD-10-CM | POA: Insufficient documentation

## 2014-02-12 DIAGNOSIS — Z8639 Personal history of other endocrine, nutritional and metabolic disease: Secondary | ICD-10-CM | POA: Insufficient documentation

## 2014-02-12 DIAGNOSIS — R011 Cardiac murmur, unspecified: Secondary | ICD-10-CM | POA: Insufficient documentation

## 2014-02-12 DIAGNOSIS — I10 Essential (primary) hypertension: Secondary | ICD-10-CM | POA: Insufficient documentation

## 2014-02-12 DIAGNOSIS — J45909 Unspecified asthma, uncomplicated: Secondary | ICD-10-CM | POA: Insufficient documentation

## 2014-02-12 DIAGNOSIS — Z79899 Other long term (current) drug therapy: Secondary | ICD-10-CM | POA: Insufficient documentation

## 2014-02-12 DIAGNOSIS — G8929 Other chronic pain: Secondary | ICD-10-CM | POA: Insufficient documentation

## 2014-02-12 DIAGNOSIS — R1013 Epigastric pain: Secondary | ICD-10-CM | POA: Insufficient documentation

## 2014-02-12 DIAGNOSIS — M1389 Other specified arthritis, multiple sites: Secondary | ICD-10-CM | POA: Insufficient documentation

## 2014-02-12 DIAGNOSIS — F329 Major depressive disorder, single episode, unspecified: Secondary | ICD-10-CM | POA: Insufficient documentation

## 2014-02-12 MED ORDER — SODIUM CHLORIDE 0.9 % IV BOLUS (SEPSIS)
1000.0000 mL | Freq: Once | INTRAVENOUS | Status: AC
  Administered 2014-02-12: 1000 mL via INTRAVENOUS

## 2014-02-12 MED ORDER — GI COCKTAIL ~~LOC~~
30.0000 mL | Freq: Once | ORAL | Status: AC
  Administered 2014-02-12: 30 mL via ORAL
  Filled 2014-02-12: qty 30

## 2014-02-12 MED ORDER — ONDANSETRON 4 MG PO TBDP
8.0000 mg | ORAL_TABLET | Freq: Once | ORAL | Status: AC
  Administered 2014-02-12: 8 mg via ORAL
  Filled 2014-02-12: qty 2

## 2014-02-12 MED ORDER — OXYCODONE HCL 5 MG PO TABS
10.0000 mg | ORAL_TABLET | Freq: Once | ORAL | Status: AC
  Administered 2014-02-12: 10 mg via ORAL
  Filled 2014-02-12: qty 2

## 2014-02-12 MED ORDER — OXYCODONE-ACETAMINOPHEN 5-325 MG PO TABS: 1.0000 | ORAL_TABLET | Freq: Three times a day (TID) | ORAL | Status: DC | PRN

## 2014-02-12 MED ORDER — ONDANSETRON HCL 4 MG/2ML IJ SOLN
4.0000 mg | Freq: Once | INTRAMUSCULAR | Status: AC
  Administered 2014-02-12: 4 mg via INTRAVENOUS
  Filled 2014-02-12: qty 2

## 2014-02-12 MED ORDER — FENTANYL CITRATE 0.05 MG/ML IJ SOLN
100.0000 ug | Freq: Once | INTRAMUSCULAR | Status: AC
  Administered 2014-02-12: 100 ug via INTRAVENOUS
  Filled 2014-02-12: qty 2

## 2014-02-12 NOTE — Discharge Instructions (Signed)
Please read and follow all provided instructions.  Your diagnoses today include:  1. Chronic abdominal pain   2. Chronic pancreatitis, unspecified pancreatitis type     Tests performed today include:  Vital signs. See below for your results today.   Medications prescribed:   Percocet (oxycodone/acetaminophen) - narcotic pain medication  DO NOT drive or perform any activities that require you to be awake and alert because this medicine can make you drowsy. BE VERY CAREFUL not to take multiple medicines containing Tylenol (also called acetaminophen). Doing so can lead to an overdose which can damage your liver and cause liver failure and possibly death.  Take any prescribed medications only as directed.  Home care instructions:   Follow any educational materials contained in this packet.  Follow-up instructions: Please follow-up with your primary care provider in the next 3 days for further evaluation of your symptoms.    Return instructions:  SEEK IMMEDIATE MEDICAL ATTENTION IF:  The pain does not go away or becomes severe   A temperature above 101F develops   Repeated vomiting occurs (multiple episodes)   The pain becomes localized to portions of the abdomen. The right side could possibly be appendicitis. In an adult, the left lower portion of the abdomen could be colitis or diverticulitis.   Blood is being passed in stools or vomit (bright red or black tarry stools)   You develop chest pain, difficulty breathing, dizziness or fainting, or become confused, poorly responsive, or inconsolable (young children)  If you have any other emergent concerns regarding your health  Additional Information: Abdominal (belly) pain can be caused by many things. Your caregiver performed an examination and possibly ordered blood/urine tests and imaging (CT scan, x-rays, ultrasound). Many cases can be observed and treated at home after initial evaluation in the emergency department. Even  though you are being discharged home, abdominal pain can be unpredictable. Therefore, you need a repeated exam if your pain does not resolve, returns, or worsens. Most patients with abdominal pain don't have to be admitted to the hospital or have surgery, but serious problems like appendicitis and gallbladder attacks can start out as nonspecific pain. Many abdominal conditions cannot be diagnosed in one visit, so follow-up evaluations are very important.  Your vital signs today were: BP 146/94   Pulse 98   Temp(Src) 98.8 F (37.1 C) (Oral)   Resp 18   SpO2 100% If your blood pressure (bp) was elevated above 135/85 this visit, please have this repeated by your doctor within one month. --------------

## 2014-02-12 NOTE — ED Notes (Signed)
PA back at the bedside.  

## 2014-02-12 NOTE — ED Provider Notes (Signed)
CSN: [id number]     Arrival date & time 02/12/14  0500 History   First MD Initiated Contact with Patient 02/12/14 [phone]     Chief Complaint  Patient presents with  . Abdominal Pain     (Consider location/radiation/quality/duration/timing/severity/associated sxs/prior Treatment) HPI Comments: Patients with history of polysubstance abuse, chronic pancreatitis due to alcohol, 21 emergency department visits in the past 6 months and multiple admissions for same -- presents with his usual epigastric and upper abdominal pain worse since last night. Patient describes the pain as a burning pain with radiation to his back. Is associated with 2 episodes of vomiting in the past 24 hours as well as multiple episodes of soft stool. Patient's symptoms today are exactly the same as previous symptoms. Two visits in the past week with mild elevation in lipase and otherwise unremarkable lab work. Patient states that he has been taking Zofran for nausea and vomiting. He was prescribed hydrocodone at his last ED visit 2 days ago but these do not help. He has been taking Imodium for diarrhea which has not really helped. No fever, chest pain, shortness of breath. No urinary symptoms. Patient states that he has not had alcohol for the past 2 days. The onset of this condition was acute. The course is constant. Aggravating factors: none. Alleviating factors: none.    Patient is a 43 y.o. male presenting with abdominal pain. The history is provided by the patient and medical records.  Abdominal Pain Associated symptoms: diarrhea, nausea and vomiting   Associated symptoms: no chest pain, no cough, no dysuria, no fever and no sore throat     Past Medical History  Diagnosis Date  . Hypertension   . Asthma   . Pancreatitis   . Cocaine abuse   . Depression   . H/O suicide attempt   . Heart murmur     "when he was little" (03/06/2013)  . Shortness of breath     "can happen at anytime" (03/06/2013)  . Anemia   . H/O  hiatal hernia   . GERD (gastroesophageal reflux disease)   . Anxiety   . WPW (Wolff-Parkinson-White syndrome)     [name] 03/06/2013  . High cholesterol   . Migraine     "monthly" (01/30/2014)  . Arthritis     "knees; arms; elbows" (01/30/2014)   Past Surgical History  Procedure Laterality Date  . Facial fracture surgery Left 1990's    "result of trauma"   . Eye surgery Left 1990's    "result of trauma"    Family History  Problem Relation Age of Onset  . Hypertension Other   . Coronary artery disease Other    History  Substance Use Topics  . Smoking status: Current Every Day Smoker -- 1.00 packs/day for 30 years    Types: Cigarettes  . Smokeless tobacco: Current User    Types: Chew  . Alcohol Use: 28.2 oz/week    47 Cans of beer per week     Comment: 01/16/23/2015 "I drink 2, 40oz  beers per day"    Review of Systems  Constitutional: Negative for fever.  HENT: Negative for rhinorrhea and sore throat.   Eyes: Negative for redness.  Respiratory: Negative for cough.   Cardiovascular: Negative for chest pain.  Gastrointestinal: Positive for nausea, vomiting, abdominal pain and diarrhea.  Genitourinary: Negative for dysuria.  Musculoskeletal: Negative for myalgias.  Skin: Negative for rash.  Neurological: Negative for headaches.    Allergies  Shellfish-derived products and Trazodone and nefazodone  Home Medications   Prior to Admission medications   Medication Sig Start Date End Date Taking? Authorizing Provider  albuterol (PROVENTIL HFA;VENTOLIN HFA) 108 (90 BASE) MCG/ACT inhaler Inhale 2 puffs into the lungs every 6 (six) hours as needed for wheezing or shortness of breath.   Yes Historical Provider, MD  folic acid (FOLVITE) 1 MG tablet Take 1 tablet (1 mg total) by mouth daily. 11/26/13  Yes Shanker Kristeen Mans, MD  HYDROcodone-acetaminophen (NORCO/VICODIN) 5-325 MG per tablet Take 1-2 tablets by mouth every 6 (six) hours as needed for moderate pain. 02/10/14  Yes Mirna Mires, MD  lipase/protease/amylase (CREON) 12000 UNITS CPEP capsule Take 2 capsules (24,000 Units total) by mouth 3 (three) times daily with meals. 02/06/14  Yes [name], MD  loperamide (IMODIUM) 2 MG capsule Take 1-2 capsules (2-4 mg total) by mouth as needed for diarrhea or loose stools. 01/23/14  Yes [name], PA-C  metoprolol tartrate (LOPRESSOR) 25 MG tablet Take 1 tablet (25 mg total) by mouth 2 (two) times daily. 02/06/14  Yes [name], MD  Multiple Vitamin (MULTIVITAMIN) capsule Take 1 capsule by mouth daily. 02/06/14  Yes [name], MD  pantoprazole (PROTONIX) 40 MG tablet Take 1 tablet (40 mg total) by mouth 2 (two) times daily. 11/28/13  Yes [name], MD  promethazine (PHENERGAN) 25 MG tablet Take 1 tablet (25 mg total) by mouth every 6 (six) hours as needed for nausea or vomiting. 02/02/14  Yes Ripudeep Krystal Eaton, MD  QUEtiapine (SEROQUEL) 100 MG tablet Take 1 tablet (100 mg total) by mouth at bedtime. For mood control 01/15/14  Yes Shanker Kristeen Mans, MD  sertraline (ZOLOFT) 25 MG tablet Take 1 tablet (25 mg total) by mouth daily. 02/06/14  Yes [name], MD  thiamine 100 MG tablet Take 100 mg by mouth daily.    Yes Historical Provider, MD  traMADol (ULTRAM) 50 MG tablet Take 1 tablet (50 mg total) by mouth every 12 (twelve) hours as needed for moderate pain. 02/06/14   [name], MD   BP 147/99  Pulse 98  Temp(Src) 98.8 F (37.1 C) (Oral)  Resp 16  SpO2 98%  Physical Exam  Nursing note and vitals reviewed. Constitutional: He appears well-developed and well-nourished.  HENT:  Head: Normocephalic and atraumatic.  Eyes: Conjunctivae are normal. Right eye exhibits no discharge. Left eye exhibits no discharge.  Neck: Normal range of motion. Neck supple.  Cardiovascular: Normal rate, regular rhythm and normal heart sounds.   Pulmonary/Chest: Effort normal and breath sounds normal.  Abdominal: Soft. Bowel sounds are normal.  He exhibits no distension. There is tenderness in the epigastric area and left upper quadrant. There is no rigidity, no rebound, no guarding, no CVA tenderness, no tenderness at McBurney's point and negative Murphy's sign.  Neurological: He is alert.  Skin: Skin is warm and dry.  Psychiatric: He has a normal mood and affect.    ED Course  Procedures (including critical care time) Labs Review Labs Reviewed - No data to display  Imaging Review No results found.   EKG Interpretation None      6:18 AM Patient seen and examined. Per records and patient report, symptoms are the same as he has been seen for multiple previous visits. Will attempt to treat symptoms with PO meds. Previous labs reviewed. Do not feel like labs and imaging are needed today unless symptoms cannot be controlled. Abdomen is soft on exam. On monitor, t-wave inversion  noted, demonstrated on previous EKG. There is nothing about patient's presentation today that suggests ACS. Will re-assess.   Tx: PO oxycodone, GI cocktail, SL zofran.  Vital signs reviewed and are as follows: BP 147/99  Pulse 98  Temp(Src) 98.8 F (37.1 C) (Oral)  Resp 16  SpO2 98%  7:30 AM Patient has not been vomiting. He seems fixated on getting IM dilaudid, which I do not feel is appropriate at this time. Will give PO trial.  8:07 AM Patient reportedly vomited, although not witnessed. Patient continue to appears well, non-distressed, abd is soft. Will give IV bolus NS, zofran. I will give IV fentanyl. I told patient we will avoid dilaudid today.   9:57 AM Patient has had fluids and is not vomiting. He looks great. I feel that he is ready for discharge to home. Will give #5 Percocet for pain control. Patient wants more than this but I declined. [facility] is working with [facility] to become established with pain clinic and I have encouraged him to continue this effort.   BP 146/94  Pulse 98  Temp(Src) 98.8 F (37.1 C) (Oral)  Resp 18  SpO2  100%  The patient was urged to return to the Emergency Department immediately with worsening of current symptoms, worsening abdominal pain, persistent vomiting, blood noted in stools, fever, or any other concerns. The patient verbalized understanding.    MDM   Final diagnoses:  Chronic abdominal pain  Chronic pancreatitis, unspecified pancreatitis type   Patient with h/o chronic pancreatitis and chronic pain -- presents today with his usual chronic pain without new features. He does not appear dehydrated. His abdomen is soft. He has had labs x 2 in past week -- with typical symptoms, do not feel labs/imaging needed today. Patient was stable over 5 hr ED stay. He exhibited bargaining for pain medications throughout stay. He is exam is stable. He has tolerated PO's with one questionable episode of vomiting. I do not see any indications for admission at this time. The patient appears very well today. No dangerous or life-threatening conditions suspected or identified by history, physical exam, and by work-up.     Carlisle Cater, PA-C 02/12/14 1005  Medical screening examination/treatment/procedure(s) were performed by non-physician practitioner and as supervising physician I was immediately available for consultation/collaboration.   EKG Interpretation None        Everlene Balls, MD 02/12/14 1542

## 2014-02-12 NOTE — ED Notes (Signed)
Pt. reports chronic/ persistent generalized abdominal pain with nausea and vomitting , seen here 2 days ago - blood tests done , treated with pain medications and discharged home with Hydrocodone with no relief. No fever or chills.

## 2014-02-12 NOTE — ED Notes (Signed)
Pt given a new gown, sheet and blanket after vomiting. PA informed, no new orders at this time.

## 2014-02-12 NOTE — ED Notes (Signed)
Pt states that he does not want the oral pain medication, states that he wants the "dilaudid injection." Verbalized this with [place] PA. PA states that pt is to start with this pain medication and it will be reassessed. Made pt aware of this. Pt reluctantly agreed to take oral pain medication.

## 2014-02-12 NOTE — ED Notes (Signed)
Pt given ginger ale.

## 2014-02-13 ENCOUNTER — Ambulatory Visit (HOSPITAL_COMMUNITY)
Admission: RE | Admit: 2014-02-13 | Discharge: 2014-02-13 | Disposition: A | Discharge status: Home / Self Care | Payer: No Typology Code available for payment source | Attending: Psychiatry | Admitting: Psychiatry

## 2014-02-13 ENCOUNTER — Encounter (HOSPITAL_COMMUNITY): Payer: Self-pay | Admitting: Emergency Medicine

## 2014-02-13 ENCOUNTER — Emergency Department (HOSPITAL_COMMUNITY)
Admission: EM | Admit: 2014-02-13 | Discharge: 2014-02-14 | Disposition: A | Discharge status: Home / Self Care | Payer: Self-pay | Attending: Emergency Medicine | Admitting: Emergency Medicine

## 2014-02-13 DIAGNOSIS — F419 Anxiety disorder, unspecified: Secondary | ICD-10-CM | POA: Insufficient documentation

## 2014-02-13 DIAGNOSIS — J45909 Unspecified asthma, uncomplicated: Secondary | ICD-10-CM | POA: Insufficient documentation

## 2014-02-13 DIAGNOSIS — F1010 Alcohol abuse, uncomplicated: Secondary | ICD-10-CM | POA: Insufficient documentation

## 2014-02-13 DIAGNOSIS — Z915 Personal history of self-harm: Secondary | ICD-10-CM | POA: Insufficient documentation

## 2014-02-13 DIAGNOSIS — F1020 Alcohol dependence, uncomplicated: Secondary | ICD-10-CM | POA: Diagnosis present

## 2014-02-13 DIAGNOSIS — Z720 Tobacco use: Secondary | ICD-10-CM | POA: Insufficient documentation

## 2014-02-13 DIAGNOSIS — F329 Major depressive disorder, single episode, unspecified: Secondary | ICD-10-CM | POA: Insufficient documentation

## 2014-02-13 DIAGNOSIS — I456 Pre-excitation syndrome: Secondary | ICD-10-CM | POA: Insufficient documentation

## 2014-02-13 DIAGNOSIS — K219 Gastro-esophageal reflux disease without esophagitis: Secondary | ICD-10-CM | POA: Insufficient documentation

## 2014-02-13 DIAGNOSIS — Z8639 Personal history of other endocrine, nutritional and metabolic disease: Secondary | ICD-10-CM | POA: Insufficient documentation

## 2014-02-13 DIAGNOSIS — Z862 Personal history of diseases of the blood and blood-forming organs and certain disorders involving the immune mechanism: Secondary | ICD-10-CM | POA: Insufficient documentation

## 2014-02-13 DIAGNOSIS — R000 Tachycardia, unspecified: Secondary | ICD-10-CM | POA: Insufficient documentation

## 2014-02-13 DIAGNOSIS — G8929 Other chronic pain: Secondary | ICD-10-CM | POA: Insufficient documentation

## 2014-02-13 DIAGNOSIS — I10 Essential (primary) hypertension: Secondary | ICD-10-CM | POA: Insufficient documentation

## 2014-02-13 DIAGNOSIS — F10230 Alcohol dependence with withdrawal, uncomplicated: Secondary | ICD-10-CM

## 2014-02-13 DIAGNOSIS — G43909 Migraine, unspecified, not intractable, without status migrainosus: Secondary | ICD-10-CM | POA: Insufficient documentation

## 2014-02-13 DIAGNOSIS — Z79899 Other long term (current) drug therapy: Secondary | ICD-10-CM | POA: Insufficient documentation

## 2014-02-13 DIAGNOSIS — Z8739 Personal history of other diseases of the musculoskeletal system and connective tissue: Secondary | ICD-10-CM | POA: Insufficient documentation

## 2014-02-13 DIAGNOSIS — R011 Cardiac murmur, unspecified: Secondary | ICD-10-CM | POA: Insufficient documentation

## 2014-02-13 DIAGNOSIS — R109 Unspecified abdominal pain: Secondary | ICD-10-CM | POA: Insufficient documentation

## 2014-02-13 LAB — COMPREHENSIVE METABOLIC PANEL
ALT: 39 U/L (ref 0–53)
AST: 43 U/L — ABNORMAL HIGH (ref 0–37)
Albumin: 3.3 g/dL — ABNORMAL LOW (ref 3.5–5.2)
Alkaline Phosphatase: 114 U/L (ref 39–117)
Anion gap: 14 (ref 5–15)
BILIRUBIN TOTAL: 0.2 mg/dL — AB (ref 0.3–1.2)
BUN: 4 mg/dL — ABNORMAL LOW (ref 6–23)
CHLORIDE: 103 meq/L (ref 96–112)
CO2: 22 mEq/L (ref 19–32)
CREATININE: 0.55 mg/dL (ref 0.50–1.35)
Calcium: 8.8 mg/dL (ref 8.4–10.5)
GFR calc Af Amer: >90 mL/min (ref >90)
GFR calc non Af Amer: >90 mL/min (ref >90)
Glucose, Bld: 83 mg/dL (ref 70–99)
Potassium: 3.9 mEq/L (ref 3.7–5.3)
Sodium: 139 mEq/L (ref 137–147)
TOTAL PROTEIN: 7.4 g/dL (ref 6.0–8.3)

## 2014-02-13 LAB — CBC WITH DIFFERENTIAL/PLATELET
Basophils Absolute: 0 10*3/uL (ref 0.0–0.1)
Basophils Relative: 0 % (ref 0–1)
EOS ABS: 0.4 10*3/uL (ref 0.0–0.7)
EOS PCT: 5 % (ref 0–5)
HEMATOCRIT: 30.4 % — AB (ref 39.0–52.0)
HEMOGLOBIN: 9.9 g/dL — AB (ref 13.0–17.0)
Lymphocytes Relative: 37 % (ref 12–46)
Lymphs Abs: 3.5 10*3/uL (ref 0.7–4.0)
MCH: 31 pg (ref 26.0–34.0)
MCHC: 32.6 g/dL (ref 30.0–36.0)
MCV: 95.3 fL (ref 78.0–100.0)
MONOS PCT: 6 % (ref 3–12)
Monocytes Absolute: 0.6 10*3/uL (ref 0.1–1.0)
Neutro Abs: 5 10*3/uL (ref 1.7–7.7)
Neutrophils Relative %: 52 % (ref 43–77)
Platelets: 220 10*3/uL (ref 150–400)
RBC: 3.19 MIL/uL — ABNORMAL LOW (ref 4.22–5.81)
RDW: 17.1 % — ABNORMAL HIGH (ref 11.5–15.5)
WBC: 9.5 10*3/uL (ref 4.0–10.5)

## 2014-02-13 LAB — LIPASE, BLOOD: Lipase: 90 U/L — ABNORMAL HIGH (ref 11–59)

## 2014-02-13 LAB — ETHANOL: Alcohol, Ethyl (B): 24 mg/dL — ABNORMAL HIGH (ref 0–11)

## 2014-02-13 LAB — OCCULT BLOOD X 1 CARD TO LAB, STOOL: Fecal Occult Bld: NEGATIVE

## 2014-02-13 MED ORDER — ONDANSETRON HCL 4 MG/2ML IJ SOLN
4.0000 mg | Freq: Once | INTRAMUSCULAR | Status: AC
  Administered 2014-02-13: 4 mg via INTRAVENOUS
  Filled 2014-02-13: qty 2

## 2014-02-13 MED ORDER — PANTOPRAZOLE SODIUM 40 MG IV SOLR
40.0000 mg | Freq: Once | INTRAVENOUS | Status: AC
  Administered 2014-02-13: 40 mg via INTRAVENOUS
  Filled 2014-02-13: qty 40

## 2014-02-13 MED ORDER — HYDROMORPHONE HCL 1 MG/ML IJ SOLN
1.0000 mg | Freq: Once | INTRAMUSCULAR | Status: AC
  Administered 2014-02-13: 1 mg via INTRAVENOUS
  Filled 2014-02-13: qty 1

## 2014-02-13 MED ORDER — SODIUM CHLORIDE 0.9 % IV SOLN
INTRAVENOUS | Status: DC
  Administered 2014-02-13: 20:00:00 via INTRAVENOUS

## 2014-02-13 MED ORDER — HYDROCODONE-ACETAMINOPHEN 5-325 MG PO TABS
1.0000 | ORAL_TABLET | Freq: Four times a day (QID) | ORAL | Status: DC | PRN
  Administered 2014-02-14 (×2): 2 via ORAL
  Filled 2014-02-13 (×2): qty 2

## 2014-02-13 MED ORDER — METOPROLOL TARTRATE 25 MG PO TABS
25.0000 mg | ORAL_TABLET | Freq: Two times a day (BID) | ORAL | Status: DC
  Administered 2014-02-14 (×2): 25 mg via ORAL
  Filled 2014-02-13 (×2): qty 1

## 2014-02-13 MED ORDER — ADULT MULTIVITAMIN W/MINERALS CH
1.0000 | ORAL_TABLET | Freq: Every day | ORAL | Status: DC
  Administered 2014-02-14: 1 via ORAL
  Filled 2014-02-13: qty 1

## 2014-02-13 MED ORDER — QUETIAPINE FUMARATE 100 MG PO TABS
100.0000 mg | ORAL_TABLET | Freq: Every day | ORAL | Status: DC
  Administered 2014-02-14: 100 mg via ORAL
  Filled 2014-02-13: qty 1

## 2014-02-13 MED ORDER — PROMETHAZINE HCL 25 MG PO TABS
25.0000 mg | ORAL_TABLET | Freq: Four times a day (QID) | ORAL | Status: DC | PRN
  Administered 2014-02-14: 25 mg via ORAL
  Filled 2014-02-13: qty 1

## 2014-02-13 MED ORDER — FOLIC ACID 1 MG PO TABS
1.0000 mg | ORAL_TABLET | Freq: Every day | ORAL | Status: DC
  Administered 2014-02-14: 1 mg via ORAL
  Filled 2014-02-13: qty 1

## 2014-02-13 MED ORDER — PANTOPRAZOLE SODIUM 40 MG PO TBEC
40.0000 mg | DELAYED_RELEASE_TABLET | Freq: Two times a day (BID) | ORAL | Status: DC
  Administered 2014-02-14: 40 mg via ORAL
  Filled 2014-02-13: qty 1

## 2014-02-13 NOTE — ED Notes (Addendum)
Pt with Hx of pancreatitis went to [facility] to detox from alcohol, was sent to ED to be medically cleared. Pt c/o left side abdominal pain. Denies SI/HI, hallucinations. Pt states last drink was yesterday at 2100.

## 2014-02-13 NOTE — BH Assessment (Signed)
Tele Assessment Note   [name] is an 43 y.o. male who came to [facility] as a walk in requesting help for Alcohol detox.  He says that if he does not get help, he will die because he has pancreatitis, and his health is bad due to his alcohol abuse.  Pt says he is drinking a 12 pk of beer and 1 pt of liquor /day.  Pt was here at [facility] 8 months ago, but relapsed.  Pt denies SI, HI, A/V hallucinations. Pt is calm and cooperative in assessment and does not appear to be responding to internal stimuli.  Pt is treated at [facility] on an OP basis.  Pt says he is applying for disability due to his pancreatitis, and he currently is not working and is living with his mom.  Pt denies depression, but says he is upset because he got a married woman pregnant and she is having an abortion and , "I don't want anything to do with her". Pt denies any current legal charges, but says that he is on probation, and his probation officer tells him he will die if he doesn't get help and stop drinking.  Dr. Parke Poisson recommends transfer to [facility] for medical clearance and placement.  Axis I: Alcohol Abuse Axis II: Deferred Axis III:  Past Medical History  Diagnosis Date  . Hypertension   . Asthma   . Pancreatitis   . Cocaine abuse   . Depression   . H/O suicide attempt   . Heart murmur     "when he was little" (03/06/2013)  . Shortness of breath     "can happen at anytime" (03/06/2013)  . Anemia   . H/O hiatal hernia   . GERD (gastroesophageal reflux disease)   . Anxiety   . WPW (Wolff-Parkinson-White syndrome)     [name] 03/06/2013  . High cholesterol   . Migraine     "monthly" (01/30/2014)  . Arthritis     "knees; arms; elbows" (01/30/2014)   Axis IV: economic problems, other psychosocial or environmental problems and problems related to legal system/crime Axis V: 41-50 serious symptoms  Past Medical History:  Past Medical History  Diagnosis Date  . Hypertension   . Asthma   . Pancreatitis   . Cocaine  abuse   . Depression   . H/O suicide attempt   . Heart murmur     "when he was little" (03/06/2013)  . Shortness of breath     "can happen at anytime" (03/06/2013)  . Anemia   . H/O hiatal hernia   . GERD (gastroesophageal reflux disease)   . Anxiety   . WPW (Wolff-Parkinson-White syndrome)     [name] 03/06/2013  . High cholesterol   . Migraine     "monthly" (01/30/2014)  . Arthritis     "knees; arms; elbows" (01/30/2014)    Past Surgical History  Procedure Laterality Date  . Facial fracture surgery Left 1990's    "result of trauma"   . Eye surgery Left 1990's    "result of trauma"     Family History:  Family History  Problem Relation Age of Onset  . Hypertension Other   . Coronary artery disease Other     Social History:  reports that he has been smoking Cigarettes.  He has a 30 pack-year smoking history. His smokeless tobacco use includes Chew. He reports that he drinks about 28.2 ounces of alcohol per week. He reports that he uses illicit drugs (Cocaine and Marijuana).  Additional  Social History:  Alcohol / Drug Use Pain Medications: denies Prescriptions: denies Over the Counter: denies History of alcohol / drug use?: Yes Longest period of sobriety (when/how long): unknown Negative Consequences of Use: Financial;Legal;Personal relationships;Work / Youth worker Withdrawal Symptoms:  (denies) Substance #1 Name of Substance 1: alcohol 1 - Age of First Use: 14 1 - Amount (size/oz): 12 pk per day plus 1/2 pt liquor 1 - Frequency: daily 1 - Duration: ongoing 1 - Last Use / Amount: this am-2 beers  CIWA:   COWS:    PATIENT STRENGTHS: (choose at least two) Ability for insight Average or above average intelligence Capable of independent living Communication skills Supportive family/friends  Allergies:  Allergies  Allergen Reactions  . Shellfish-Derived Products Nausea And Vomiting  . Trazodone And Nefazodone Other (See Comments)    Muscle spasms    Home  Medications:  (Not in a hospital admission)  OB/GYN Status:  No LMP for male patient.  General Assessment Data Location of Assessment: [facility] Is this a Tele or Face-to-Face Assessment?: Face-to-Face Is this an Initial Assessment or a Re-assessment for this encounter?: Initial Assessment Living Arrangements: Parent;Other relatives Can pt return to current living arrangement?: Yes Admission Status: Voluntary Is patient capable of signing voluntary admission?: Yes Transfer from: Home Referral Source: Self/Family/Friend  Medical Screening Exam ([place]) Medical Exam completed: No Reason for MSE not completed: Other: (pt sent to [facility] for medical clearance)  [facility] Living Arrangements: Parent;Other relatives Name of Psychiatrist: Beverly Sessions Name of Therapist: [facility]  Education Status Is patient currently in school?: No  Risk to self with the past 6 months Suicidal Ideation: No Suicidal Intent: No Is patient at risk for suicide?: No Suicidal Plan?: No Access to Means: No What has been your use of drugs/alcohol within the last 12 months?:  (see SA) Previous Attempts/Gestures: No Other Self Harm Risks:  (drinking) Intentional Self Injurious Behavior: None Family Suicide History: No Recent stressful life event(s): Conflict (Comment);Financial Problems;Job Loss (pt got a married woman preganant, she is having abortion) Persecutory voices/beliefs?: No Depression:  (denies) Depression Symptoms:  (denies) Substance abuse history and/or treatment for substance abuse?: Yes Suicide prevention information given to non-admitted patients: Not applicable  Risk to Others within the past 6 months Homicidal Ideation: No Thoughts of Harm to Others: No Current Homicidal Intent: No Current Homicidal Plan: No Access to Homicidal Means: No History of harm to others?: No Assessment of Violence: None Noted Does patient have access to weapons?: No Criminal  Charges Pending?: No Does patient have a court date: No  Psychosis Hallucinations: None noted Delusions: None noted  Mental Status Report Appear/Hygiene: Unremarkable Eye Contact: Fair Motor Activity: Unremarkable Speech: Logical/coherent Level of Consciousness: Alert Mood: Depressed;Sad;Anxious Affect: Anxious;Sad;Depressed Anxiety Level: Severe Thought Processes: Coherent;Relevant Judgement: Unimpaired Orientation: Person;Place;Time;Situation Obsessive Compulsive Thoughts/Behaviors: None  Cognitive Functioning Concentration: Decreased Memory: Recent Intact;Remote Intact IQ: Average Insight: Fair Impulse Control: Fair Appetite: Poor Weight Loss: 15 Weight Gain: 0 Sleep: No Change Total Hours of Sleep: 8 Vegetative Symptoms: None  ADLScreening [facility] Assessment Services) Patient's cognitive ability adequate to safely complete daily activities?: Yes Patient able to express need for assistance with ADLs?: Yes Independently performs ADLs?: Yes (appropriate for developmental age)  Prior Inpatient Therapy Prior Inpatient Therapy: Yes Prior Therapy Dates:  (2015, 2012) Prior Therapy Facilty/Provider(s):  ([facility] [place]) Reason for Treatment:  (etoh)  Prior Outpatient Therapy Prior Outpatient Therapy: Yes Prior Therapy Dates:  (unsure) Prior Therapy Facilty/Provider(s):  (  [facility]) Reason for Treatment:  (Sa, depression)  ADL Screening (condition at time of admission) Patient's cognitive ability adequate to safely complete daily activities?: Yes Is the patient deaf or have difficulty hearing?: No Does the patient have difficulty seeing, even when wearing glasses/contacts?: No Does the patient have difficulty concentrating, remembering, or making decisions?: No Patient able to express need for assistance with ADLs?: Yes Does the patient have difficulty dressing or bathing?: No Independently performs ADLs?: Yes (appropriate for developmental age) Does the  patient have difficulty walking or climbing stairs?: No  Home Assistive Devices/Equipment Home Assistive Devices/Equipment: None    Abuse/Neglect Assessment (Assessment to be complete while patient is alone) Physical Abuse: Denies Verbal Abuse: Denies Sexual Abuse: Denies Exploitation of patient/patient's resources: Denies Self-Neglect: Denies Values / Beliefs Cultural Requests During Hospitalization: None Spiritual Requests During Hospitalization: None        Additional Information 1:1 In Past 12 Months?: No CIRT Risk: No Elopement Risk: No Does patient have medical clearance?: No     Disposition:  Disposition Initial Assessment Completed for this Encounter: Yes Disposition of Patient: Inpatient treatment program Type of inpatient treatment program: Adult  [facility] 02/13/2014 5:33 PM

## 2014-02-13 NOTE — ED Notes (Signed)
Pt received in rm 30. This RN assumed care of this pt.  Alert and oriented and complaining of pain. Pt has not been seen by MD yet. Will notify MD of pt's pain.

## 2014-02-13 NOTE — BH Assessment (Signed)
[facility] Assessment Progress Note  Ellouise Newer, TS asked that this writer accompany pt to [facility] for medical clearance.  She reports that pt denies SI, HI, or psychotic symptoms.  He presents at [facility] seeking detox from alcohol.  This Probation officer called [facility].  When they arrived I accompanied pt to [facility].  He took with him a large suitcase, a backpack, and a cloth bag.  He also took his cell phone, his wallet and several other small items from the visitor locker in the lobby at [facility].  Upon arrival I notified [place] registration that pt was in the lobby and that he was there for medical clearance.  I then departed from [facility].  Jalene Mullet, MA Triage Specialist 02/13/2014 @ 18:01

## 2014-02-13 NOTE — ED Provider Notes (Signed)
CSN: [id number]     Arrival date & time 02/13/14  1736 History   First MD Initiated Contact with Patient 02/13/14 1845     Chief Complaint  Patient presents with  . Abdominal Pain  . ETOH detox      (Consider location/radiation/quality/duration/timing/severity/associated sxs/prior Treatment) Patient is a 43 y.o. male presenting with abdominal pain. The history is provided by the patient and medical records. No language interpreter was used.  Abdominal Pain Pain location:  LUQ Pain radiates to:  Back Pain severity:  Severe Onset quality:  Gradual Duration:  2 days Timing:  Intermittent Progression:  Worsening Chronicity:  Chronic Context: alcohol use   Associated symptoms: diarrhea and nausea   Associated symptoms: no chills, no fever and no melena   Risk factors: alcohol abuse     Past Medical History  Diagnosis Date  . Hypertension   . Asthma   . Pancreatitis   . Cocaine abuse   . Depression   . H/O suicide attempt   . Heart murmur     "when he was little" (03/06/2013)  . Shortness of breath     "can happen at anytime" (03/06/2013)  . Anemia   . H/O hiatal hernia   . GERD (gastroesophageal reflux disease)   . Anxiety   . WPW (Wolff-Parkinson-White syndrome)     [name] 03/06/2013  . High cholesterol   . Migraine     "monthly" (01/30/2014)  . Arthritis     "knees; arms; elbows" (01/30/2014)   Past Surgical History  Procedure Laterality Date  . Facial fracture surgery Left 1990's    "result of trauma"   . Eye surgery Left 1990's    "result of trauma"    Family History  Problem Relation Age of Onset  . Hypertension Other   . Coronary artery disease Other    History  Substance Use Topics  . Smoking status: Current Every Day Smoker -- 1.00 packs/day for 30 years    Types: Cigarettes  . Smokeless tobacco: Current User    Types: Chew  . Alcohol Use: 28.2 oz/week    47 Cans of beer per week     Comment: 01/16/23/2015 "I drink 2, 40oz  beers per day"     Review of Systems  Constitutional: Negative for fever and chills.  Gastrointestinal: Positive for nausea, abdominal pain and diarrhea. Negative for blood in stool and melena.  All other systems reviewed and are negative.     Allergies  Shellfish-derived products and Trazodone and nefazodone  Home Medications   Prior to Admission medications   Medication Sig Start Date End Date Taking? Authorizing Provider  albuterol (PROVENTIL HFA;VENTOLIN HFA) 108 (90 BASE) MCG/ACT inhaler Inhale 2 puffs into the lungs every 6 (six) hours as needed for wheezing or shortness of breath.   Yes Historical Provider, MD  folic acid (FOLVITE) 1 MG tablet Take 1 tablet (1 mg total) by mouth daily. 11/26/13  Yes Shanker Kristeen Mans, MD  HYDROcodone-acetaminophen (NORCO/VICODIN) 5-325 MG per tablet Take 1-2 tablets by mouth every 6 (six) hours as needed for moderate pain. 02/10/14  Yes Mirna Mires, MD  lipase/protease/amylase (CREON) 12000 UNITS CPEP capsule Take 2 capsules (24,000 Units total) by mouth 3 (three) times daily with meals. 02/06/14  Yes [name], MD  loperamide (IMODIUM) 2 MG capsule Take 1-2 capsules (2-4 mg total) by mouth as needed for diarrhea or loose stools. 01/23/14  Yes Marianne L York, PA-C  metoprolol tartrate (LOPRESSOR) 25 MG tablet Take  1 tablet (25 mg total) by mouth 2 (two) times daily. 02/06/14  Yes [name], MD  Multiple Vitamin (MULTIVITAMIN) capsule Take 1 capsule by mouth daily. 02/06/14  Yes [name], MD  pantoprazole (PROTONIX) 40 MG tablet Take 1 tablet (40 mg total) by mouth 2 (two) times daily. 11/28/13  Yes [name], MD  promethazine (PHENERGAN) 25 MG tablet Take 1 tablet (25 mg total) by mouth every 6 (six) hours as needed for nausea or vomiting. 02/02/14  Yes Ripudeep Krystal Eaton, MD  QUEtiapine (SEROQUEL) 100 MG tablet Take 1 tablet (100 mg total) by mouth at bedtime. For mood control 01/15/14  Yes Shanker Kristeen Mans, MD  sertraline (ZOLOFT)  25 MG tablet Take 75 mg by mouth daily.   Yes Historical Provider, MD  thiamine 100 MG tablet Take 100 mg by mouth daily.    Yes Historical Provider, MD   BP 159/93  Pulse 111  Temp(Src) 98.4 F (36.9 C) (Oral)  Resp 20  SpO2 100% Physical Exam  Nursing note and vitals reviewed. Constitutional: He is oriented to person, place, and time. He appears well-developed and well-nourished.  HENT:  Head: Normocephalic.  Mouth/Throat: Oropharynx is clear and moist.  Eyes: Pupils are equal, round, and reactive to light.  Neck: Neck supple.  Cardiovascular: Regular rhythm, normal heart sounds and intact distal pulses.  Tachycardia present.   Pulmonary/Chest: Effort normal and breath sounds normal.  Abdominal: Soft. There is tenderness. There is no rebound.  Musculoskeletal: He exhibits no edema and no tenderness.  Lymphadenopathy:    He has no cervical adenopathy.  Neurological: He is alert and oriented to person, place, and time.  Skin: Skin is warm and dry.  Psychiatric: He has a normal mood and affect.    ED Course  Procedures (including critical care time) Labs Review Labs Reviewed  CBC WITH DIFFERENTIAL  COMPREHENSIVE METABOLIC PANEL  ETHANOL  LIPASE, BLOOD    Imaging Review No results found.   EKG Interpretation None     Patient seeking detox from alcohol.  History of chronic alcohol abuse with numerous ED visits for abdominal pain/chronic pancreatitis.  Presented to [facility] today, sent to ED due to active abdominal pain.  Medical clearance completed. Labs reviewed and shared with patient. Chronic abdominal pain/pancreatitis, with no current indication for medical admission. Psych holding orders entered.  TSS consult pending.  Patient signed out to Dr. Leonides Schanz at end of shift. MDM   Final diagnoses:  None    Chronic abdominal pain. Alcohol abuse.    Norman Herrlich, NP 02/14/14 [phone]

## 2014-02-14 ENCOUNTER — Emergency Department (HOSPITAL_COMMUNITY)
Admission: EM | Admit: 2014-02-14 | Discharge: 2014-02-14 | Disposition: A | Discharge status: Home / Self Care | Payer: No Typology Code available for payment source | Attending: Emergency Medicine | Admitting: Emergency Medicine

## 2014-02-14 ENCOUNTER — Encounter (HOSPITAL_COMMUNITY): Payer: Self-pay | Admitting: Emergency Medicine

## 2014-02-14 DIAGNOSIS — K859 Acute pancreatitis, unspecified: Secondary | ICD-10-CM | POA: Insufficient documentation

## 2014-02-14 DIAGNOSIS — R011 Cardiac murmur, unspecified: Secondary | ICD-10-CM | POA: Insufficient documentation

## 2014-02-14 DIAGNOSIS — F10230 Alcohol dependence with withdrawal, uncomplicated: Secondary | ICD-10-CM

## 2014-02-14 DIAGNOSIS — Z720 Tobacco use: Secondary | ICD-10-CM | POA: Insufficient documentation

## 2014-02-14 DIAGNOSIS — F1099 Alcohol use, unspecified with unspecified alcohol-induced disorder: Secondary | ICD-10-CM

## 2014-02-14 DIAGNOSIS — F329 Major depressive disorder, single episode, unspecified: Secondary | ICD-10-CM | POA: Insufficient documentation

## 2014-02-14 DIAGNOSIS — D649 Anemia, unspecified: Secondary | ICD-10-CM | POA: Insufficient documentation

## 2014-02-14 DIAGNOSIS — F1010 Alcohol abuse, uncomplicated: Secondary | ICD-10-CM | POA: Insufficient documentation

## 2014-02-14 DIAGNOSIS — Z8639 Personal history of other endocrine, nutritional and metabolic disease: Secondary | ICD-10-CM | POA: Insufficient documentation

## 2014-02-14 DIAGNOSIS — G43909 Migraine, unspecified, not intractable, without status migrainosus: Secondary | ICD-10-CM | POA: Insufficient documentation

## 2014-02-14 DIAGNOSIS — Z79899 Other long term (current) drug therapy: Secondary | ICD-10-CM | POA: Insufficient documentation

## 2014-02-14 DIAGNOSIS — I10 Essential (primary) hypertension: Secondary | ICD-10-CM | POA: Insufficient documentation

## 2014-02-14 DIAGNOSIS — K219 Gastro-esophageal reflux disease without esophagitis: Secondary | ICD-10-CM | POA: Insufficient documentation

## 2014-02-14 DIAGNOSIS — M1389 Other specified arthritis, multiple sites: Secondary | ICD-10-CM | POA: Insufficient documentation

## 2014-02-14 DIAGNOSIS — F1020 Alcohol dependence, uncomplicated: Secondary | ICD-10-CM | POA: Diagnosis present

## 2014-02-14 DIAGNOSIS — J45909 Unspecified asthma, uncomplicated: Secondary | ICD-10-CM | POA: Insufficient documentation

## 2014-02-14 MED ORDER — LORAZEPAM 1 MG PO TABS
0.0000 mg | ORAL_TABLET | Freq: Four times a day (QID) | ORAL | Status: DC
  Filled 2014-02-14 (×2): qty 1

## 2014-02-14 MED ORDER — LORAZEPAM 2 MG/ML IJ SOLN
0.0000 mg | Freq: Four times a day (QID) | INTRAMUSCULAR | Status: DC
  Administered 2014-02-14: 1 mg via INTRAVENOUS

## 2014-02-14 MED ORDER — VITAMIN B-1 100 MG PO TABS
100.0000 mg | ORAL_TABLET | Freq: Every day | ORAL | Status: DC
  Administered 2014-02-14: 100 mg via ORAL
  Filled 2014-02-14: qty 1

## 2014-02-14 MED ORDER — ACETAMINOPHEN 325 MG PO TABS: 650.0000 mg | ORAL_TABLET | Freq: Four times a day (QID) | ORAL | Status: DC | PRN

## 2014-02-14 NOTE — Progress Notes (Signed)
Pt declined from [facility] and [facility] due to  Medical acuity.   [name] [phone]  ED CSW 02/14/2014 1246pm

## 2014-02-14 NOTE — Consult Note (Signed)
[facility] Face-to-Face Psychiatry Consult   Reason for Consult:  Alcohol dependence/detox Referring Physician:  EDP  [name] is an 43 y.o. male. Total Time spent with patient: 20 minutes  Assessment: AXIS I:  Alcohol Abuse/dependence AXIS II:  Deferred AXIS III:   Past Medical History  Diagnosis Date  . Hypertension   . Asthma   . Pancreatitis   . Cocaine abuse   . Depression   . H/O suicide attempt   . Heart murmur     "when he was little" (03/06/2013)  . Shortness of breath     "can happen at anytime" (03/06/2013)  . Anemia   . H/O hiatal hernia   . GERD (gastroesophageal reflux disease)   . Anxiety   . WPW (Wolff-Parkinson-White syndrome)     [name] 03/06/2013  . High cholesterol   . Migraine     "monthly" (01/30/2014)  . Arthritis     "knees; arms; elbows" (01/30/2014)   AXIS IV:  other psychosocial or environmental problems, problems related to legal system/crime, problems related to social environment and problems with primary support group AXIS V:  Mild symptoms  Plan:  No evidence of imminent risk to self or others at present.  Dr. Darleene Cleaver assessed the patient and concurs with the plan.  Subjective:   [name] is a 43 y.o. male patient left and does not want treatment.  HPI:  The patient came to the ED for alcohol detox, drank 2 beers prior to arrival; typically drinks 12 pack daily, denies withdrawal seizures or complications from withdrawal of alcohol.  [place] x 2 for alcohol detox, no current therapist of psychiatrist, no medications for six months.  Denies suicidal/homicidal ideations, hallucinations, and drug abuse.  He does have two Rx, one for Vicodin for past pancreatitis.   The patient left after rounding because he did not want treatment.  When he was in the parking lot and talked to his parole officer who told him if he did not get detox, he would go to jail.  He returned to the ED but left again before being reassessed.   Alcohol level low with no  signs or symptoms of withdrawal. HPI Elements:   Location:  generalized. Quality:  chronic. Severity:  mild. Timing:  intermittent. Duration:  months. Context:  stressors.  Past Psychiatric History: Past Medical History  Diagnosis Date  . Hypertension   . Asthma   . Pancreatitis   . Cocaine abuse   . Depression   . H/O suicide attempt   . Heart murmur     "when he was little" (03/06/2013)  . Shortness of breath     "can happen at anytime" (03/06/2013)  . Anemia   . H/O hiatal hernia   . GERD (gastroesophageal reflux disease)   . Anxiety   . WPW (Wolff-Parkinson-White syndrome)     [name] 03/06/2013  . High cholesterol   . Migraine     "monthly" (01/30/2014)  . Arthritis     "knees; arms; elbows" (01/30/2014)    reports that he has been smoking Cigarettes.  He has a 30 pack-year smoking history. His smokeless tobacco use includes Chew. He reports that he drinks about 28.2 ounces of alcohol per week. He reports that he uses illicit drugs (Cocaine and Marijuana). Family History  Problem Relation Age of Onset  . Hypertension Other   . Coronary artery disease Other            Allergies:   Allergies  Allergen Reactions  .  Shellfish-Derived Products Nausea And Vomiting  . Trazodone And Nefazodone Other (See Comments)    Muscle spasms    ACT Assessment Complete:  Yes:    Educational Status    Risk to Self: Risk to self with the past 6 months Is patient at risk for suicide?: No Substance abuse history and/or treatment for substance abuse?: Yes  Risk to Others:    Abuse:    Prior Inpatient Therapy:    Prior Outpatient Therapy:    Additional Information:                    Objective: Blood pressure 154/97, pulse 78, temperature 98.8 F (37.1 C), temperature source Oral, SpO2 100.00%.There is no weight on file to calculate BMI. Results for orders placed during the hospital encounter of 02/13/14 (from the past 72 hour(s))  CBC WITH DIFFERENTIAL      Status: Abnormal   Collection Time    02/13/14  7:34 PM      Result Value Ref Range   WBC 9.5  4.0 - 10.5 K/uL   RBC 3.19 (*) 4.22 - 5.81 MIL/uL   Hemoglobin 9.9 (*) 13.0 - 17.0 g/dL   HCT 30.4 (*) 39.0 - 52.0 %   MCV 95.3  78.0 - 100.0 fL   MCH 31.0  26.0 - 34.0 pg   MCHC 32.6  30.0 - 36.0 g/dL   RDW 17.1 (*) 11.5 - 15.5 %   Platelets 220  150 - 400 K/uL   Neutrophils Relative % 52  43 - 77 %   Neutro Abs 5.0  1.7 - 7.7 K/uL   Lymphocytes Relative 37  12 - 46 %   Lymphs Abs 3.5  0.7 - 4.0 K/uL   Monocytes Relative 6  3 - 12 %   Monocytes Absolute 0.6  0.1 - 1.0 K/uL   Eosinophils Relative 5  0 - 5 %   Eosinophils Absolute 0.4  0.0 - 0.7 K/uL   Basophils Relative 0  0 - 1 %   Basophils Absolute 0.0  0.0 - 0.1 K/uL  COMPREHENSIVE METABOLIC PANEL     Status: Abnormal   Collection Time    02/13/14  7:34 PM      Result Value Ref Range   Sodium 139  137 - 147 mEq/L   Potassium 3.9  3.7 - 5.3 mEq/L   Chloride 103  96 - 112 mEq/L   CO2 22  19 - 32 mEq/L   Glucose, Bld 83  70 - 99 mg/dL   BUN 4 (*) 6 - 23 mg/dL   Creatinine, Ser 0.55  0.50 - 1.35 mg/dL   Calcium 8.8  8.4 - 10.5 mg/dL   Total Protein 7.4  6.0 - 8.3 g/dL   Albumin 3.3 (*) 3.5 - 5.2 g/dL   AST 43 (*) 0 - 37 U/L   ALT 39  0 - 53 U/L   Alkaline Phosphatase 114  39 - 117 U/L   Total Bilirubin 0.2 (*) 0.3 - 1.2 mg/dL   GFR calc non Af Amer >90  >90 mL/min   GFR calc Af Amer >90  >90 mL/min   Comment: (NOTE)     The eGFR has been calculated using the CKD EPI equation.     This calculation has not been validated in all clinical situations.     eGFR's persistently <90 mL/min signify possible Chronic Kidney     Disease.   Anion gap 14  5 - 15  ETHANOL  Status: Abnormal   Collection Time    02/13/14  7:34 PM      Result Value Ref Range   Alcohol, Ethyl (B) 24 (*) 0 - 11 mg/dL   Comment:            LOWEST DETECTABLE LIMIT FOR     SERUM ALCOHOL IS 11 mg/dL     FOR MEDICAL PURPOSES ONLY  LIPASE, BLOOD      Status: Abnormal   Collection Time    02/13/14  7:34 PM      Result Value Ref Range   Lipase 90 (*) 11 - 59 U/L  OCCULT BLOOD X 1 CARD TO LAB, STOOL     Status: None   Collection Time    02/13/14  9:46 PM      Result Value Ref Range   Fecal Occult Bld NEGATIVE  NEGATIVE   Labs are reviewed and are pertinent for no medical issues needing treatment.  No current facility-administered medications for this encounter.   Current Outpatient Prescriptions  Medication Sig Dispense Refill  . albuterol (PROVENTIL HFA;VENTOLIN HFA) 108 (90 BASE) MCG/ACT inhaler Inhale 2 puffs into the lungs every 6 (six) hours as needed for wheezing or shortness of breath.      . folic acid (FOLVITE) 1 MG tablet Take 1 tablet (1 mg total) by mouth daily.  30 tablet  0  . HYDROcodone-acetaminophen (NORCO/VICODIN) 5-325 MG per tablet Take 1-2 tablets by mouth every 6 (six) hours as needed for moderate pain.  20 tablet  0  . lipase/protease/amylase (CREON) 12000 UNITS CPEP capsule Take 2 capsules (24,000 Units total) by mouth 3 (three) times daily with meals.  270 capsule  3  . loperamide (IMODIUM) 2 MG capsule Take 1-2 capsules (2-4 mg total) by mouth as needed for diarrhea or loose stools.  30 capsule  0  . metoprolol tartrate (LOPRESSOR) 25 MG tablet Take 1 tablet (25 mg total) by mouth 2 (two) times daily.  180 tablet  3  . Multiple Vitamin (MULTIVITAMIN) capsule Take 1 capsule by mouth daily.  90 capsule  3  . pantoprazole (PROTONIX) 40 MG tablet Take 1 tablet (40 mg total) by mouth 2 (two) times daily.  60 tablet  3  . promethazine (PHENERGAN) 25 MG tablet Take 1 tablet (25 mg total) by mouth every 6 (six) hours as needed for nausea or vomiting.  30 tablet  0  . QUEtiapine (SEROQUEL) 100 MG tablet Take 1 tablet (100 mg total) by mouth at bedtime. For mood control  30 tablet  0  . sertraline (ZOLOFT) 25 MG tablet Take 75 mg by mouth daily.      [name] thiamine 100 MG tablet Take 100 mg by mouth daily.          Psychiatric Specialty Exam:     Blood pressure 154/97, pulse 78, temperature 98.8 F (37.1 C), temperature source Oral, SpO2 100.00%.There is no weight on file to calculate BMI.  General Appearance: Casual  Eye Contact::  Good  Speech:  Normal Rate  Volume:  Normal  Mood:  Anxious  Affect:  Congruent  Thought Process:  Coherent  Orientation:  Full (Time, Place, and Person)  Thought Content:  WDL  Suicidal Thoughts:  No  Homicidal Thoughts:  No  Memory:  Immediate;   Good Recent;   Good Remote;   Good  Judgement:  Fair  Insight:  Fair  Psychomotor Activity:  Normal  Concentration:  Good  Recall:  Good  Fund of  Knowledge:Good  Language: Fair  Akathisia:  No  Handed:  Right  AIMS (if indicated):     Assets:  Leisure Time Physical Health Resilience  Sleep:      Musculoskeletal: Strength & Muscle Tone: within normal limits Gait & Station: normal Patient leans: N/A  Treatment Plan Summary: Discharge home with follow-up with his regular provider or AA, patient left--decided he did not want detox.  Waylan Boga, [place] 02/14/2014 5:58 PM  Patient seen, evaluated and I agree with notes by Nurse Practitioner. Corena Pilgrim, MD

## 2014-02-14 NOTE — ED Notes (Signed)
Pt was given a meal while waiting for further information from ACT team.  ACT team has already evaluated this patient Discharge and follow up packet reviewed with pt

## 2014-02-14 NOTE — ED Notes (Signed)
MD/NP at bedside.

## 2014-02-14 NOTE — Progress Notes (Signed)
Pt referred to [facility] and [facility].   [name] [phone]  ED CSW 02/14/2014 1103am

## 2014-02-14 NOTE — ED Notes (Signed)
PA at bedside. Pt is requesting re-evaluation for anxiety

## 2014-02-14 NOTE — Discharge Instructions (Signed)
°Emergency Department Resource Guide °1) Find a Doctor and Pay Out of Pocket °Although you won't have to find out who is covered by your insurance plan, it is a good idea to ask around and get recommendations. You will then need to call the office and see if the doctor you have chosen will accept you as a new patient and what types of options they offer for patients who are self-pay. Some doctors offer discounts or will set up payment plans for their patients who do not have insurance, but you will need to ask so you aren't surprised when you get to your appointment. ° °2) Contact Your Local Health Department °Not all health departments have doctors that can see patients for sick visits, but many do, so it is worth a call to see if yours does. If you don't know where your local health department is, you can check in your phone book. The CDC also has a tool to help you locate your state's health department, and many state websites also have listings of all of their local health departments. ° °3) Find a Walk-in Clinic °If your illness is not likely to be very severe or complicated, you may want to try a walk in clinic. These are popping up all over the country in pharmacies, drugstores, and shopping centers. They're usually staffed by nurse practitioners or physician assistants that have been trained to treat common illnesses and complaints. They're usually fairly quick and inexpensive. However, if you have serious medical issues or chronic medical problems, these are probably not your best option. ° °No Primary Care Doctor: °- Call Health Connect at  832-8000 - they can help you locate a primary care doctor that  accepts your insurance, provides certain services, etc. °- Physician Referral Service- 1-800-533-3463 ° °Chronic Pain Problems: °Organization         Address  Phone   Notes  °[facility]  ([phone]) [phone] Patients need to be referred by their primary care doctor.  ° °Medication  Assistance: °Organization         Address  Phone   Notes  °[place] [facility] [address] °[place], [place] [address] (336) 641-8030 --Must be a resident of [place] °-- Must have NO insurance coverage whatsoever (no Medicaid/ Medicare, etc.) °-- The pt. MUST have a primary care doctor that directs their care regularly and follows them in the community °  °[facility]  (866) 331-1348   °[employer]  (888) 892-1162   ° °Agencies that provide inexpensive medical care: °Organization         Address  Phone   Notes  °[facility]  (336) 832-8035   °[facility]    (336) 832-7272   °[facility] [address] °[place], [place] [address] (336) 832-4777   °[facility] [address], °[place] (336) 271-4999   °[facility]    (336) 373-0678   °[facility]    (336) 272-1050   °[facility] ° [address], [name] Phone:  (336) 832-4444, Fax:  (336) 832-4440 Hours of Operation:  9 am - 6 pm, M-F.  Also accepts Medicaid/Medicare and self-pay.  °[facility] ° [address], [place] Phone: (336) [phone], Fax: (336) [phone]. Hours of Operation:  8:30 am - 5:30 pm, M-F.  Also accepts Medicaid and self-pay.  °[facility] [place] [address], [place] Phone: (336) 878-6027   °[facility] [address], [place], [place] (336)723-1848, Ext. 123 Mondays & Thursdays: 7-9 AM.  First 15 patients are seen on a first come, first serve basis. °  ° °Medicaid-accepting [place] Providers: ° °Organization         Address  Phone   Notes  °[facility] [address], [place] (336) 641-2100 Also accepts self-pay patients.  °[facility] [address], [place] ° (336) 856-9996   °[facility] [address], [place]  (336) 288-8857   °[facility] [address] [place] [address], [place] (336) 299-7000   °Veita Bland [address], [place]  ° (336) 373-1557 Only accepts [place] Access Medicaid patients after they have their name applied to their card.  ° °Self-Pay (no insurance) in [place]: ° °Organization         Address  Phone   Notes  °Sickle Cell Patients, [facility] [address], [place] (336) 832-1970   °[facility] [address], [place] (336) 832-4400   °[facility] ° [address], [place] (336) 992-4800   °[facility]/Dr. Osei-Bonsu ° [address] [place] [address], [place] or [address], [place] (336) [phone] Phone number for both [place] and [place] locations is the same.  °[facility] [address], [place] (336) 299-0000   °[facility] [address] [place] [address], [place] or [address] (336) 852-7530 °(336) 878-2260   °[facility] [address], [place] (336) 350-1642, phone; (336) 294-5005, fax Sees patients 1st and 3rd Saturday of every month.  Must not qualify for public or private insurance (i.e. Medicaid, Medicare, [place] Health Choice, Veterans' Benefits) • Household income should be no more than 200% of the poverty level •The clinic cannot treat you if you are pregnant or think you are pregnant • Sexually transmitted diseases are not treated at the clinic.  ° ° °Dental Care: °Organization         Address  Phone  Notes  °[place] [facility] [address], [place] (336) 641-6152 Accepts children up to age 21 who are enrolled in Medicaid or [place] Health Choice; pregnant women with a Medicaid card; and children who have applied for Medicaid or [place] Health Choice, but were declined, whose parents can pay a reduced fee at time of service.  °[place]  [facility] [place]  [address], [place] (336) 641-7733 Accepts children up to age 21 who are enrolled in Medicaid or [place] Health Choice; pregnant women with a Medicaid card; and children who have applied for Medicaid or [place] Health Choice, but were declined, whose parents can pay a reduced fee at time of service.  °[facility] ° [address], [place] (336) [phone] Patients are seen by appointment only. Walk-ins are not accepted. [facility] will see patients 18 years of age and older. °Monday - Tuesday (8am-5pm) °Most Wednesdays (8:30-5pm) °$30 per visit, cash only  °[facility] ° [address], [place] (336) [phone] Patients are seen by appointment only. Walk-ins are not accepted. [facility] will see patients 18 years of age and older. °One   Wednesday Evening (Monthly: Volunteer Based).  $30 per visit, cash only  °[facility]  (919) 537-3737 for adults; Children under age 4, call Graduate Pediatric Dentistry at ([phone]) [phone]. Children aged 4-14, please call ([phone]) [phone] to request a pediatric application. ° Dental services are provided in all areas of dental care including fillings, crowns and bridges, complete and partial dentures, implants, gum treatment, root canals, and extractions. Preventive care is also provided. Treatment is provided to both adults and children. °Patients are selected via a lottery and there is often a waiting list. °  °[facility] [address], °[place] ° (336) 763-8833 [email or web address] °  °[facility] [address], [place], [place] (336)723-1848, Ext. 123 Second and Fourth Thursday of each month, opens at 6:30 AM; Clinic ends at 9 AM.  Patients are seen on a first-come first-served basis, and a limited number are seen during each clinic.  ° °[facility] ° [address], [place], [place] (336) [phone]    Eligibility Requirements °You must have lived in [place], [place], or [place] counties for at least the last three months. °  You cannot be eligible for state or federal sponsored healthcare insurance, including Veterans Administration, Medicaid, or Medicare. °  You generally cannot be eligible for healthcare insurance through your employer.  °  How to apply: °Eligibility screenings are held every Tuesday and Wednesday afternoon from 1:00 pm until 4:00 pm. You do not need an appointment for the interview!  °[facility] [address], [place], [place] 336-631-2330   °[facility]  336-342-8273   °[facility]  336-703-3100   °[facility]  336-570-6415   ° °Behavioral Health Resources in the Community: °Intensive Outpatient Programs °Organization         Address  Phone  Notes  °[facility] [address], [place], [place] 336-878-6098   °[facility] [address], [place], [place] 336-832-9800   °[facility] [address], [place], [place] ° 336-882-2125   °[facility] [address],  °[place], [place] 1-800-853-5163 or 336-641-4981   °Substance Abuse Resources °Organization         Address  Phone  Notes  °[facility]  336-882-2125   °[facility]  336-784-9470   °[facility]  336-285-9073   °[facility]  336-845-3988   °Residential & Outpatient Substance Abuse Program  1-800-659-3381   °Psychological Services °Organization         Address  Phone  Notes  °[facility]  336- 832-9600   °[facility]  336- 378-7881   °[facility] [address], [place] 1-800-853-5163 or 336-641-4981   ° °Mobile Crisis Teams °Organization         Address  Phone  Notes  °[facility], Mobile Crisis Care Unit  1-877-626-1772   °Assertive °[facility] ° [address]  [place], [place] 336-834-9664   °Sharon DeEsch [address] °[place] 336-554-5454   ° °Self-Help/Support Groups °Organization         Address  Phone             Notes  °[facility] [place] - variety of support groups  336- 373-1402 Call for more information  °Narcotics Anonymous (NA), [facility] [address], °[place]   2 meetings at this location  ° °  Residential Treatment Programs Organization         Address  Phone  Notes  [facility] [address]    [place]  1-(769) 830-1618   [facility]  [address] [place] [address], [place], [place]   [place], [place] (660)[phone] Admissions: 8am-3pm M-F  [facility] [address],    [place], [place] 202-542-7062   [facility] [address] [place], [place], [place]   [facility] [address],  [place], [place]   [facility] - Intensive Outpatient [address], [place], [place]   [facility] [place]  [place], [place] 1-604-783-0019 or (808)865-0283   [facility] ([facility] [address] [place], [place] Accepts Medicaid  [facility] [address],  [place] 1-773-021-5197 Substance Abuse/Addiction Treatment   [facility]         Address  Phone  Notes  [facility]  352-715-4093   Domenic Schwab, PhD [address] [place], [place]   (319)300-5848 or 430-293-7612   [place], [place] 639-312-2536   [facility] [address] [place], [place] (628) 440-8492 Insurance/Medicaid/sponsorship through [facility] [address]                                    [place], [place] [phone])[phone] [place] [address], [place] [phone]    Dr. Adele Schilder  [phone]   [facility] Dept. 1) [address], [place] 2) [place] 3)  [place] [address], [place] [phone] ([phone]  ([phone]   [place] 236-240-9794 or [phone] (After Hours)      Alcohol Withdrawal Alcohol withdrawal happens when you normally drink alcohol a lot and suddenly stop drinking. Alcohol withdrawal symptoms can be mild to very bad. Mild withdrawal symptoms can include feeling sick to your stomach (nauseous), headache, or feeling irritable. Bad withdrawal symptoms can include shakiness, being very nervous (anxious), and not thinking clearly.  HOME CARE  Join an alcohol support group.  Stay away from people or situations that make you want to drink.  Eat a healthy diet. Eat a lot of fresh fruits, vegetables, and lean meats. GET HELP RIGHT AWAY IF:   You become confused. You start to see and hear things that are not really there.  You feel your heart beating very fast.  You throw up (vomit) blood or cannot stop throwing up. This may be bright red or look like black coffee grounds.  You have blood in your poop (stool). This may be bright red, maroon colored, or black and tarry.  You are lightheaded or pass out (faint).  You develop a fever. MAKE SURE YOU:   Understand these instructions.  Will watch your condition.  Will get help right away if you are not doing well or get worse. Document Released: 10/12/2007 Document Revised: 07/18/2011 Document Reviewed: 10/12/2007 [facility] Patient Information 2015 [place], [place]. This information is not intended to replace advice given to you by your health  care provider. Make sure you discuss any questions you have with your health care provider.

## 2014-02-14 NOTE — [facility] Suicide Risk Assessment (Signed)
Suicide Risk Assessment  Discharge Assessment     Demographic Factors:  Male  Total Time spent with patient: 20 minutes Psychiatric Specialty Exam:     Blood pressure 154/97, pulse 78, temperature 98.8 F (37.1 C), temperature source Oral, SpO2 100.00%.There is no weight on file to calculate BMI.  General Appearance: Casual  Eye Contact::  Good  Speech:  Normal Rate  Volume:  Normal  Mood:  Anxious  Affect:  Congruent  Thought Process:  Coherent  Orientation:  Full (Time, Place, and Person)  Thought Content:  WDL  Suicidal Thoughts:  No  Homicidal Thoughts:  No  Memory:  Immediate;   Good Recent;   Good Remote;   Good  Judgement:  Fair  Insight:  Fair  Psychomotor Activity:  Normal  Concentration:  Good  Recall:  Good  Fund of Knowledge:Good  Language: Fair  Akathisia:  No  Handed:  Right  AIMS (if indicated):     Assets:  Leisure Time Physical Health Resilience  Sleep:      Musculoskeletal: Strength & Muscle Tone: within normal limits Gait & Station: normal Patient leans: N/A   Mental Status Per Nursing Assessment::   On Admission:   Alcohol intoxication  Current Mental Status by Physician: NA  Loss Factors: NA  Historical Factors: NA  Risk Reduction Factors:   Sense of responsibility to family and Positive social support  Continued Clinical Symptoms:  Alcohol dependence  Cognitive Features That Contribute To Risk:  None   Suicide Risk:  Minimal: No identifiable suicidal ideation.  Patients presenting with no risk factors but with morbid ruminations; may be classified as minimal risk based on the severity of the depressive symptoms  Discharge Diagnoses:   AXIS I:  Alcohol Abuse/dependence AXIS II:  Deferred AXIS III:   Past Medical History  Diagnosis Date  . Hypertension   . Asthma   . Pancreatitis   . Cocaine abuse   . Depression   . H/O suicide attempt   . Heart murmur     "when he was little" (03/06/2013)  . Shortness of  breath     "can happen at anytime" (03/06/2013)  . Anemia   . H/O hiatal hernia   . GERD (gastroesophageal reflux disease)   . Anxiety   . WPW (Wolff-Parkinson-White syndrome)     [name] 03/06/2013  . High cholesterol   . Migraine     "monthly" (01/30/2014)  . Arthritis     "knees; arms; elbows" (01/30/2014)   AXIS IV:  other psychosocial or environmental problems, problems related to legal system/crime, problems related to social environment and problems with primary support group AXIS V:  61-70 mild symptoms  Plan Of Care/Follow-up recommendations:  Activity:  as tolerated Diet:  low-sodium heart healthy diet  Is patient on multiple antipsychotic therapies at discharge:  No   Has Patient had three or more failed trials of antipsychotic monotherapy by history:  No  Recommended Plan for Multiple Antipsychotic Therapies: NA    Ilamae Geng, [place], PMH-NP 02/14/2014, 6:01 PM

## 2014-02-14 NOTE — ED Provider Notes (Signed)
Medical screening examination/treatment/procedure(s) were performed by non-physician practitioner and as supervising physician I was immediately available for consultation/collaboration.  Shaunta Oncale L Tylar Merendino, MD 02/14/14 1528 

## 2014-02-14 NOTE — ED Provider Notes (Signed)
CSN: [id number]     Arrival date & time 02/14/14  1238 History   First MD Initiated Contact with Patient 02/14/14 1239     Chief Complaint  Patient presents with  . Anxiety  . Headache     (Consider location/radiation/quality/duration/timing/severity/associated sxs/prior Treatment) HPI Comments: Pt comes in today after leaving [facility] this morning saying that he needs help with anxiety and etoh abuse. Pt states that he left because he didn't feel like anyone was doing anything to help him. Denies vomiting. Hasn't used etoh in the last 24 hours. He states that he feels anxious. Denies hallucination, delusion, si/hi. States that he need to be reevaluated  The history is provided by the patient. No language interpreter was used.    Past Medical History  Diagnosis Date  . Hypertension   . Asthma   . Pancreatitis   . Cocaine abuse   . Depression   . H/O suicide attempt   . Heart murmur     "when he was little" (03/06/2013)  . Shortness of breath     "can happen at anytime" (03/06/2013)  . Anemia   . H/O hiatal hernia   . GERD (gastroesophageal reflux disease)   . Anxiety   . WPW (Wolff-Parkinson-White syndrome)     [name] 03/06/2013  . High cholesterol   . Migraine     "monthly" (01/30/2014)  . Arthritis     "knees; arms; elbows" (01/30/2014)   Past Surgical History  Procedure Laterality Date  . Facial fracture surgery Left 1990's    "result of trauma"   . Eye surgery Left 1990's    "result of trauma"    Family History  Problem Relation Age of Onset  . Hypertension Other   . Coronary artery disease Other    History  Substance Use Topics  . Smoking status: Current Every Day Smoker -- 1.00 packs/day for 30 years    Types: Cigarettes  . Smokeless tobacco: Current User    Types: Chew  . Alcohol Use: 28.2 oz/week    47 Cans of beer per week     Comment: 01/16/23/2015 "I drink 2, 40oz  beers per day"    Review of Systems  All other systems reviewed and are  negative.     Allergies  Shellfish-derived products and Trazodone and nefazodone  Home Medications   Prior to Admission medications   Medication Sig Start Date End Date Taking? Authorizing Provider  albuterol (PROVENTIL HFA;VENTOLIN HFA) 108 (90 BASE) MCG/ACT inhaler Inhale 2 puffs into the lungs every 6 (six) hours as needed for wheezing or shortness of breath.   Yes Historical Provider, MD  folic acid (FOLVITE) 1 MG tablet Take 1 tablet (1 mg total) by mouth daily. 11/26/13  Yes Shanker Kristeen Mans, MD  HYDROcodone-acetaminophen (NORCO/VICODIN) 5-325 MG per tablet Take 1-2 tablets by mouth every 6 (six) hours as needed for moderate pain. 02/10/14  Yes Mirna Mires, MD  lipase/protease/amylase (CREON) 12000 UNITS CPEP capsule Take 2 capsules (24,000 Units total) by mouth 3 (three) times daily with meals. 02/06/14  Yes [name], MD  loperamide (IMODIUM) 2 MG capsule Take 1-2 capsules (2-4 mg total) by mouth as needed for diarrhea or loose stools. 01/23/14  Yes [name], PA-C  metoprolol tartrate (LOPRESSOR) 25 MG tablet Take 1 tablet (25 mg total) by mouth 2 (two) times daily. 02/06/14  Yes [name], MD  Multiple Vitamin (MULTIVITAMIN) capsule Take 1 capsule by mouth daily. 02/06/14  Yes Olugbemiga E  Doreene Burke, MD  pantoprazole (PROTONIX) 40 MG tablet Take 1 tablet (40 mg total) by mouth 2 (two) times daily. 11/28/13  Yes [name], MD  promethazine (PHENERGAN) 25 MG tablet Take 1 tablet (25 mg total) by mouth every 6 (six) hours as needed for nausea or vomiting. 02/02/14  Yes Ripudeep Krystal Eaton, MD  QUEtiapine (SEROQUEL) 100 MG tablet Take 1 tablet (100 mg total) by mouth at bedtime. For mood control 01/15/14  Yes Shanker Kristeen Mans, MD  sertraline (ZOLOFT) 25 MG tablet Take 75 mg by mouth daily.   Yes Historical Provider, MD  thiamine 100 MG tablet Take 100 mg by mouth daily.    Yes Historical Provider, MD   BP 154/97  Pulse 78  Temp(Src) 98.8 F (37.1 C) (Oral)   SpO2 100% Physical Exam  Nursing note and vitals reviewed. Constitutional: He is oriented to person, place, and time. He appears well-nourished.  Cardiovascular: Normal rate and regular rhythm.   Pulmonary/Chest: Effort normal and breath sounds normal.  Abdominal: Soft. Bowel sounds are normal.  Epigastric tenderness  Musculoskeletal: Normal range of motion.  Neurological: He is alert and oriented to person, place, and time. Coordination normal.  Showing no signs of dt at this time  Skin: Skin is warm and dry.  Psychiatric: He expresses no suicidal plans and no homicidal plans.    ED Course  Procedures (including critical care time) Labs Review Labs Reviewed - No data to display  Imaging Review No results found.   EKG Interpretation None      MDM   Final diagnoses:  ETOH abuse   Pt has already been evaluated by tts in the last 24 hours and they feels he is safe to go home and evatluted    Glendell Docker, NP 02/14/14 1328

## 2014-02-16 ENCOUNTER — Encounter (HOSPITAL_COMMUNITY): Payer: Self-pay | Admitting: Emergency Medicine

## 2014-02-16 ENCOUNTER — Emergency Department (HOSPITAL_COMMUNITY)
Admission: EM | Admit: 2014-02-16 | Discharge: 2014-02-16 | Disposition: A | Discharge status: Home / Self Care | Payer: Self-pay | Attending: Emergency Medicine | Admitting: Emergency Medicine

## 2014-02-16 DIAGNOSIS — G8929 Other chronic pain: Secondary | ICD-10-CM | POA: Insufficient documentation

## 2014-02-16 DIAGNOSIS — F329 Major depressive disorder, single episode, unspecified: Secondary | ICD-10-CM | POA: Insufficient documentation

## 2014-02-16 DIAGNOSIS — R1013 Epigastric pain: Secondary | ICD-10-CM | POA: Insufficient documentation

## 2014-02-16 DIAGNOSIS — I10 Essential (primary) hypertension: Secondary | ICD-10-CM | POA: Insufficient documentation

## 2014-02-16 DIAGNOSIS — K219 Gastro-esophageal reflux disease without esophagitis: Secondary | ICD-10-CM | POA: Insufficient documentation

## 2014-02-16 DIAGNOSIS — R110 Nausea: Secondary | ICD-10-CM | POA: Insufficient documentation

## 2014-02-16 DIAGNOSIS — R011 Cardiac murmur, unspecified: Secondary | ICD-10-CM | POA: Insufficient documentation

## 2014-02-16 DIAGNOSIS — J45909 Unspecified asthma, uncomplicated: Secondary | ICD-10-CM | POA: Insufficient documentation

## 2014-02-16 DIAGNOSIS — R109 Unspecified abdominal pain: Secondary | ICD-10-CM

## 2014-02-16 DIAGNOSIS — Z720 Tobacco use: Secondary | ICD-10-CM | POA: Insufficient documentation

## 2014-02-16 DIAGNOSIS — F419 Anxiety disorder, unspecified: Secondary | ICD-10-CM | POA: Insufficient documentation

## 2014-02-16 DIAGNOSIS — Z79899 Other long term (current) drug therapy: Secondary | ICD-10-CM | POA: Insufficient documentation

## 2014-02-16 DIAGNOSIS — D649 Anemia, unspecified: Secondary | ICD-10-CM | POA: Insufficient documentation

## 2014-02-16 DIAGNOSIS — M1990 Unspecified osteoarthritis, unspecified site: Secondary | ICD-10-CM | POA: Insufficient documentation

## 2014-02-16 LAB — I-STAT CHEM 8, ED
BUN: 5 mg/dL — AB (ref 6–23)
CALCIUM ION: 1.13 mmol/L (ref 1.12–1.23)
Chloride: 106 mEq/L (ref 96–112)
Creatinine, Ser: 0.7 mg/dL (ref 0.50–1.35)
Glucose, Bld: 152 mg/dL — ABNORMAL HIGH (ref 70–99)
HCT: 42 % (ref 39.0–52.0)
Hemoglobin: 14.3 g/dL (ref 13.0–17.0)
Potassium: 3.3 mEq/L — ABNORMAL LOW (ref 3.7–5.3)
Sodium: 143 mEq/L (ref 137–147)
TCO2: 23 mmol/L (ref 0–100)

## 2014-02-16 MED ORDER — ONDANSETRON 4 MG PO TBDP
4.0000 mg | ORAL_TABLET | Freq: Once | ORAL | Status: AC
  Administered 2014-02-16: 4 mg via ORAL
  Filled 2014-02-16: qty 1

## 2014-02-16 MED ORDER — GI COCKTAIL ~~LOC~~
30.0000 mL | Freq: Once | ORAL | Status: AC
  Administered 2014-02-16: 30 mL via ORAL
  Filled 2014-02-16: qty 30

## 2014-02-16 MED ORDER — POTASSIUM CHLORIDE CRYS ER 20 MEQ PO TBCR
40.0000 meq | EXTENDED_RELEASE_TABLET | Freq: Once | ORAL | Status: AC
  Administered 2014-02-16: 40 meq via ORAL
  Filled 2014-02-16: qty 2

## 2014-02-16 MED ORDER — ACETAMINOPHEN 325 MG PO TABS
650.0000 mg | ORAL_TABLET | Freq: Once | ORAL | Status: AC
  Administered 2014-02-16: 650 mg via ORAL
  Filled 2014-02-16: qty 2

## 2014-02-16 NOTE — ED Provider Notes (Signed)
Medical screening examination/treatment/procedure(s) were performed by non-physician practitioner and as supervising physician I was immediately available for consultation/collaboration.   EKG Interpretation None        Hoy Morn, MD 02/16/14 [phone]

## 2014-02-16 NOTE — ED Provider Notes (Signed)
CSN: [id number]     Arrival date & time 02/16/14  0708 History   First MD Initiated Contact with Patient 02/16/14 [phone]     Chief Complaint  Patient presents with  . Abdominal Pain  . Emesis     (Consider location/radiation/quality/duration/timing/severity/associated sxs/prior Treatment) HPI Comments: This is a 43 y/o male well known to the ED who presents to the ED for the seventh time within the past 11 days complaining of "pancreatitis" abdominal pain, nausea, vomiting and diarrhea beginning around 12:30 AM today. Pt reports this feels the same as his prior pancreatitis flares. Reports he last had ETOH intake 3 days ago. States he had 3 episodes of non-bloody emesis and an episode of non-bloody diarrhea, last episode of emesis occuring about 3 hours PTA. States he tried taking oxycodone with no relief. Denies fever, chills, chest pain or sob. It is noted he had labs drawn 3 days ago, and other than anemia, labs at baseline. Negative fecal occult blood. It is noted on chart review he frequently requests dilaudid for pain control.  Patient is a 43 y.o. male presenting with abdominal pain and vomiting. The history is provided by the patient.  Abdominal Pain Associated symptoms: diarrhea, nausea and vomiting   Emesis Associated symptoms: abdominal pain and diarrhea     Past Medical History  Diagnosis Date  . Hypertension   . Asthma   . Pancreatitis   . Cocaine abuse   . Depression   . H/O suicide attempt   . Heart murmur     "when he was little" (03/06/2013)  . Shortness of breath     "can happen at anytime" (03/06/2013)  . Anemia   . H/O hiatal hernia   . GERD (gastroesophageal reflux disease)   . Anxiety   . WPW (Wolff-Parkinson-White syndrome)     [name] 03/06/2013  . High cholesterol   . Migraine     "monthly" (01/30/2014)  . Arthritis     "knees; arms; elbows" (01/30/2014)   Past Surgical History  Procedure Laterality Date  . Facial fracture surgery Left 1990's   "result of trauma"   . Eye surgery Left 1990's    "result of trauma"    Family History  Problem Relation Age of Onset  . Hypertension Other   . Coronary artery disease Other    History  Substance Use Topics  . Smoking status: Current Every Day Smoker -- 1.00 packs/day for 30 years    Types: Cigarettes  . Smokeless tobacco: Current User    Types: Chew  . Alcohol Use: 28.2 oz/week    47 Cans of beer per week     Comment: 01/16/23/2015 "I drink 2, 40oz  beers per day"    Review of Systems  Gastrointestinal: Positive for nausea, vomiting, abdominal pain and diarrhea.  All other systems reviewed and are negative.     Allergies  Shellfish-derived products and Trazodone and nefazodone  Home Medications   Prior to Admission medications   Medication Sig Start Date End Date Taking? Authorizing Provider  folic acid (FOLVITE) 1 MG tablet Take 1 tablet (1 mg total) by mouth daily. 11/26/13  Yes Shanker Kristeen Mans, MD  HYDROcodone-acetaminophen (NORCO/VICODIN) 5-325 MG per tablet Take 1-2 tablets by mouth every 6 (six) hours as needed for moderate pain. 02/10/14  Yes Mirna Mires, MD  lipase/protease/amylase (CREON) 12000 UNITS CPEP capsule Take 2 capsules (24,000 Units total) by mouth 3 (three) times daily with meals. 02/06/14  Yes [name], MD  loperamide (IMODIUM) 2 MG capsule Take 1-2 capsules (2-4 mg total) by mouth as needed for diarrhea or loose stools. 01/23/14  Yes [name], PA-C  metoprolol tartrate (LOPRESSOR) 25 MG tablet Take 1 tablet (25 mg total) by mouth 2 (two) times daily. 02/06/14  Yes [name], MD  Multiple Vitamin (MULTIVITAMIN) capsule Take 1 capsule by mouth daily. 02/06/14  Yes [name], MD  pantoprazole (PROTONIX) 40 MG tablet Take 1 tablet (40 mg total) by mouth 2 (two) times daily. 11/28/13  Yes [name], MD  promethazine (PHENERGAN) 25 MG tablet Take 1 tablet (25 mg total) by mouth every 6 (six) hours as needed for  nausea or vomiting. 02/02/14  Yes Ripudeep Krystal Eaton, MD  QUEtiapine (SEROQUEL) 100 MG tablet Take 1 tablet (100 mg total) by mouth at bedtime. For mood control 01/15/14  Yes Shanker Kristeen Mans, MD  sertraline (ZOLOFT) 25 MG tablet Take 75 mg by mouth daily.   Yes Historical Provider, MD  thiamine 100 MG tablet Take 100 mg by mouth daily.    Yes Historical Provider, MD  albuterol (PROVENTIL HFA;VENTOLIN HFA) 108 (90 BASE) MCG/ACT inhaler Inhale 2 puffs into the lungs every 6 (six) hours as needed for wheezing or shortness of breath.    Historical Provider, MD   BP 139/90  Pulse 96  Temp(Src) 98.7 F (37.1 C) (Oral)  Resp 16  SpO2 100% Physical Exam  Nursing note and vitals reviewed. Constitutional: He is oriented to person, place, and time. He appears well-developed and well-nourished. No distress.  HENT:  Head: Normocephalic and atraumatic.  Mouth/Throat: Oropharynx is clear and moist.  Eyes: Conjunctivae are normal.  Neck: Normal range of motion. Neck supple.  Cardiovascular: Normal rate, regular rhythm and normal heart sounds.   Pulmonary/Chest: Effort normal and breath sounds normal.  Abdominal: Soft. Normal appearance and bowel sounds are normal. He exhibits no distension. There is no rigidity, no rebound and no guarding.  Diffuse abdominal tenderness, more prominent mid-epigastric. No peritoneal signs.  Musculoskeletal: Normal range of motion. He exhibits no edema.  Neurological: He is alert and oriented to person, place, and time.  Skin: Skin is warm and dry. He is not diaphoretic.  Psychiatric: He has a normal mood and affect. His behavior is normal.    ED Course  Procedures (including critical care time) Labs Review Labs Reviewed  I-STAT CHEM 8, ED - Abnormal; Notable for the following:    Potassium 3.3 (*)    BUN 5 (*)    Glucose, Bld 152 (*)    All other components within normal limits    Imaging Review No results found.   EKG Interpretation None      MDM   Final  diagnoses:  Chronic abdominal pain   Patient nontoxic appearing and in no apparent distress. Afebrile, vital signs stable. Multiple workups within the past few weeks for the same. No vomiting in the emergency department. No anemia noted on today's labs. Potassium 3.3, replaced PO. Discussed followup with GI for ultimate management of his chronic abdominal pain. Abdomen is soft with no peritoneal signs. He is resting comfortably on exam bed watching TV. Stable for discharge. Return precautions given. Patient states understanding of treatment care plan and is agreeable.  Case discussed with attending Dr. Venora Maples who agrees with plan of care.   Carman Ching, PA-C 02/16/14 [phone]

## 2014-02-16 NOTE — ED Notes (Signed)
Pt from home via [facility] with continuing c/o abdominal pain and emesis related to pancreatitis.  Last alcoholic drink was this past Thursday.  Pt in NAD, A&O.

## 2014-02-16 NOTE — Discharge Instructions (Signed)
Abdominal Pain °Many things can cause abdominal pain. Usually, abdominal pain is not caused by a disease and will improve without treatment. It can often be observed and treated at home. Your health care provider will do a physical exam and possibly order blood tests and X-rays to help determine the seriousness of your pain. However, in many cases, more time must pass before a clear cause of the pain can be found. Before that point, your health care provider may not know if you need more testing or further treatment. °HOME CARE INSTRUCTIONS  °Monitor your abdominal pain for any changes. The following actions may help to alleviate any discomfort you are experiencing: °· Only take over-the-counter or prescription medicines as directed by your health care provider. °· Do not take laxatives unless directed to do so by your health care provider. °· Try a clear liquid diet (broth, tea, or water) as directed by your health care provider. Slowly move to a bland diet as tolerated. °SEEK MEDICAL CARE IF: °· You have unexplained abdominal pain. °· You have abdominal pain associated with nausea or diarrhea. °· You have pain when you urinate or have a bowel movement. °· You experience abdominal pain that wakes you in the night. °· You have abdominal pain that is worsened or improved by eating food. °· You have abdominal pain that is worsened with eating fatty foods. °· You have a fever. °SEEK IMMEDIATE MEDICAL CARE IF:  °· Your pain does not go away within 2 hours. °· You keep throwing up (vomiting). °· Your pain is felt only in portions of the abdomen, such as the right side or the left lower portion of the abdomen. °· You pass bloody or black tarry stools. °MAKE SURE YOU: °· Understand these instructions.   °· Will watch your condition.   °· Will get help right away if you are not doing well or get worse.   °Document Released: 02/02/2005 Document Revised: 04/30/2013 Document Reviewed: 01/02/2013 °[employer]® Patient Information  ©2015 [employer]. This information is not intended to replace advice given to you by your health care provider. Make sure you discuss any questions you have with your health care provider. ° °Chronic Pain °Chronic pain can be defined as pain that is off and on and lasts for 3-6 months or longer. Many things cause chronic pain, which can make it difficult to make a diagnosis. There are many treatment options available for chronic pain. However, finding a treatment that works well for you may require trying various approaches until the right one is found. Many people benefit from a combination of two or more types of treatment to control their pain. °SYMPTOMS  °Chronic pain can occur anywhere in the body and can range from mild to very severe. Some types of chronic pain include: °· Headache. °· Low back pain. °· Cancer pain. °· Arthritis pain. °· Neurogenic pain. This is pain resulting from damage to nerves. ° People with chronic pain may also have other symptoms such as: °· Depression. °· Anger. °· Insomnia. °· Anxiety. °DIAGNOSIS  °Your health care provider will help diagnose your condition over time. In many cases, the initial focus will be on excluding possible conditions that could be causing the pain. Depending on your symptoms, your health care provider may order tests to diagnose your condition. Some of these tests may include:  °· Blood tests.   °· CT scan.   °· MRI.   °· X-rays.   °· Ultrasounds.   °· Nerve conduction studies.   °You may need to see a specialist.  °  TREATMENT  °Finding treatment that works well may take time. You may be referred to a pain specialist. He or she may prescribe medicine or therapies, such as:  °· Mindful meditation or yoga. °· Shots (injections) of numbing or pain-relieving medicines into the spine or area of pain. °· Local electrical stimulation. °· Acupuncture.   °· Massage therapy.   °· Aroma, color, light, or sound therapy.   °· Biofeedback.   °· Working with a physical  therapist to keep from getting stiff.   °· Regular, gentle exercise.   °· Cognitive or behavioral therapy.   °· Group support.   °Sometimes, surgery may be recommended.  °HOME CARE INSTRUCTIONS  °· Take all medicines as directed by your health care provider.   °· Lessen stress in your life by relaxing and doing things such as listening to calming music.   °· Exercise or be active as directed by your health care provider.   °· Eat a healthy diet and include things such as vegetables, fruits, fish, and lean meats in your diet.   °· Keep all follow-up appointments with your health care provider.   °· Attend a support group with others suffering from chronic pain. °SEEK MEDICAL CARE IF:  °· Your pain gets worse.   °· You develop a new pain that was not there before.   °· You cannot tolerate medicines given to you by your health care provider.   °· You have new symptoms since your last visit with your health care provider.   °SEEK IMMEDIATE MEDICAL CARE IF:  °· You feel weak.   °· You have decreased sensation or numbness.   °· You lose control of bowel or bladder function.   °· Your pain suddenly gets much worse.   °· You develop shaking. °· You develop chills. °· You develop confusion. °· You develop chest pain. °· You develop shortness of breath.   °MAKE SURE YOU: °· Understand these instructions. °· Will watch your condition. °· Will get help right away if you are not doing well or get worse. °Document Released: 01/15/2002 Document Revised: 12/26/2012 Document Reviewed: 10/19/2012 °[employer]® Patient Information ©2015 [employer]. This information is not intended to replace advice given to you by your health care provider. Make sure you discuss any questions you have with your health care provider. ° °

## 2014-02-17 NOTE — ED Provider Notes (Signed)
Medical screening examination/treatment/procedure(s) were performed by non-physician practitioner and as supervising physician I was immediately available for consultation/collaboration.   EKG Interpretation   Date/Time:  Thursday February 13 2014 19:47:57 EDT Ventricular Rate:  90 PR Interval:  110 QRS Duration: 76 QT Interval:  330 QTC Calculation: 403 R Axis:   65 Text Interpretation:  Sinus rhythm with short PR T wave abnormality,  consider inferior ischemia T wave abnormality, consider anterolateral  ischemia Abnormal ECG since last tracing no significant change Confirmed  by Wilson Singer  MD, STEPHEN ([id number]) on 02/13/2014 8:02:39 PM        Dorie Rank, MD 02/17/14 [phone]

## 2014-02-23 ENCOUNTER — Inpatient Hospital Stay (HOSPITAL_COMMUNITY): Payer: Self-pay

## 2014-02-23 ENCOUNTER — Inpatient Hospital Stay (HOSPITAL_COMMUNITY)
Admission: EM | Admit: 2014-02-23 | Discharge: 2014-02-25 | DRG: 439 | Disposition: A | Discharge status: Home / Self Care | Payer: Self-pay | Attending: Internal Medicine | Admitting: Internal Medicine

## 2014-02-23 ENCOUNTER — Encounter (HOSPITAL_COMMUNITY): Payer: Self-pay | Admitting: Emergency Medicine

## 2014-02-23 DIAGNOSIS — K863 Pseudocyst of pancreas: Secondary | ICD-10-CM

## 2014-02-23 DIAGNOSIS — K219 Gastro-esophageal reflux disease without esophagitis: Secondary | ICD-10-CM | POA: Diagnosis present

## 2014-02-23 DIAGNOSIS — Z720 Tobacco use: Secondary | ICD-10-CM | POA: Diagnosis present

## 2014-02-23 DIAGNOSIS — K861 Other chronic pancreatitis: Secondary | ICD-10-CM | POA: Diagnosis present

## 2014-02-23 DIAGNOSIS — F419 Anxiety disorder, unspecified: Secondary | ICD-10-CM | POA: Diagnosis present

## 2014-02-23 DIAGNOSIS — E780 Pure hypercholesterolemia: Secondary | ICD-10-CM | POA: Diagnosis present

## 2014-02-23 DIAGNOSIS — F17210 Nicotine dependence, cigarettes, uncomplicated: Secondary | ICD-10-CM | POA: Diagnosis present

## 2014-02-23 DIAGNOSIS — K449 Diaphragmatic hernia without obstruction or gangrene: Secondary | ICD-10-CM | POA: Diagnosis present

## 2014-02-23 DIAGNOSIS — J45909 Unspecified asthma, uncomplicated: Secondary | ICD-10-CM | POA: Diagnosis present

## 2014-02-23 DIAGNOSIS — F10239 Alcohol dependence with withdrawal, unspecified: Secondary | ICD-10-CM | POA: Diagnosis present

## 2014-02-23 DIAGNOSIS — F1010 Alcohol abuse, uncomplicated: Secondary | ICD-10-CM | POA: Diagnosis present

## 2014-02-23 DIAGNOSIS — K852 Alcohol induced acute pancreatitis without necrosis or infection: Secondary | ICD-10-CM

## 2014-02-23 DIAGNOSIS — F10939 Alcohol use, unspecified with withdrawal, unspecified: Secondary | ICD-10-CM

## 2014-02-23 DIAGNOSIS — M1990 Unspecified osteoarthritis, unspecified site: Secondary | ICD-10-CM | POA: Diagnosis present

## 2014-02-23 DIAGNOSIS — E876 Hypokalemia: Secondary | ICD-10-CM | POA: Diagnosis present

## 2014-02-23 DIAGNOSIS — F1410 Cocaine abuse, uncomplicated: Secondary | ICD-10-CM | POA: Diagnosis present

## 2014-02-23 DIAGNOSIS — F1014 Alcohol abuse with alcohol-induced mood disorder: Secondary | ICD-10-CM

## 2014-02-23 DIAGNOSIS — I10 Essential (primary) hypertension: Secondary | ICD-10-CM | POA: Diagnosis present

## 2014-02-23 DIAGNOSIS — R109 Unspecified abdominal pain: Secondary | ICD-10-CM

## 2014-02-23 DIAGNOSIS — F39 Unspecified mood [affective] disorder: Secondary | ICD-10-CM | POA: Diagnosis present

## 2014-02-23 DIAGNOSIS — K862 Cyst of pancreas: Secondary | ICD-10-CM | POA: Diagnosis present

## 2014-02-23 DIAGNOSIS — F329 Major depressive disorder, single episode, unspecified: Secondary | ICD-10-CM | POA: Diagnosis present

## 2014-02-23 DIAGNOSIS — D649 Anemia, unspecified: Secondary | ICD-10-CM | POA: Diagnosis present

## 2014-02-23 DIAGNOSIS — F17200 Nicotine dependence, unspecified, uncomplicated: Secondary | ICD-10-CM | POA: Diagnosis present

## 2014-02-23 DIAGNOSIS — M5136 Other intervertebral disc degeneration, lumbar region: Secondary | ICD-10-CM | POA: Diagnosis present

## 2014-02-23 LAB — COMPREHENSIVE METABOLIC PANEL
ALT: 46 U/L (ref 0–53)
ANION GAP: 15 (ref 5–15)
AST: 62 U/L — ABNORMAL HIGH (ref 0–37)
Albumin: 3.8 g/dL (ref 3.5–5.2)
Alkaline Phosphatase: 124 U/L — ABNORMAL HIGH (ref 39–117)
BUN: 10 mg/dL (ref 6–23)
CALCIUM: 9.1 mg/dL (ref 8.4–10.5)
CO2: 24 mEq/L (ref 19–32)
CREATININE: 0.55 mg/dL (ref 0.50–1.35)
Chloride: 99 mEq/L (ref 96–112)
GFR calc non Af Amer: >90 mL/min (ref >90)
GLUCOSE: 95 mg/dL (ref 70–99)
Potassium: 3.6 mEq/L — ABNORMAL LOW (ref 3.7–5.3)
SODIUM: 138 meq/L (ref 137–147)
TOTAL PROTEIN: 8.1 g/dL (ref 6.0–8.3)
Total Bilirubin: 0.3 mg/dL (ref 0.3–1.2)

## 2014-02-23 LAB — URINALYSIS, ROUTINE W REFLEX MICROSCOPIC
BILIRUBIN URINE: NEGATIVE
GLUCOSE, UA: NEGATIVE mg/dL
HGB URINE DIPSTICK: NEGATIVE
KETONES UR: NEGATIVE mg/dL
Leukocytes, UA: NEGATIVE
NITRITE: NEGATIVE
PH: 6 (ref 5.0–8.0)
Protein, ur: NEGATIVE mg/dL
Urobilinogen, UA: 0.2 mg/dL (ref 0.0–1.0)

## 2014-02-23 LAB — RAPID URINE DRUG SCREEN, HOSP PERFORMED
AMPHETAMINES: NOT DETECTED
BARBITURATES: POSITIVE — AB
BENZODIAZEPINES: POSITIVE — AB
COCAINE: POSITIVE — AB
Opiates: NOT DETECTED
Tetrahydrocannabinol: NOT DETECTED

## 2014-02-23 LAB — TROPONIN I: Troponin I: <0.3 ng/mL (ref <0.30)

## 2014-02-23 LAB — CBC WITH DIFFERENTIAL/PLATELET
Basophils Absolute: 0 10*3/uL (ref 0.0–0.1)
Basophils Relative: 0 % (ref 0–1)
EOS ABS: 0.3 10*3/uL (ref 0.0–0.7)
EOS PCT: 3 % (ref 0–5)
HCT: 33.9 % — ABNORMAL LOW (ref 39.0–52.0)
Hemoglobin: 11.7 g/dL — ABNORMAL LOW (ref 13.0–17.0)
Lymphocytes Relative: 24 % (ref 12–46)
Lymphs Abs: 2.3 10*3/uL (ref 0.7–4.0)
MCH: 32 pg (ref 26.0–34.0)
MCHC: 34.5 g/dL (ref 30.0–36.0)
MCV: 92.6 fL (ref 78.0–100.0)
Monocytes Absolute: 0.7 10*3/uL (ref 0.1–1.0)
Monocytes Relative: 8 % (ref 3–12)
Neutro Abs: 6 10*3/uL (ref 1.7–7.7)
Neutrophils Relative %: 65 % (ref 43–77)
PLATELETS: 236 10*3/uL (ref 150–400)
RBC: 3.66 MIL/uL — AB (ref 4.22–5.81)
RDW: 16.4 % — AB (ref 11.5–15.5)
WBC: 9.4 10*3/uL (ref 4.0–10.5)

## 2014-02-23 LAB — ETHANOL

## 2014-02-23 LAB — MAGNESIUM: Magnesium: 1.6 mg/dL (ref 1.5–2.5)

## 2014-02-23 LAB — LIPASE, BLOOD: Lipase: 161 U/L — ABNORMAL HIGH (ref 11–59)

## 2014-02-23 LAB — TSH: TSH: 0.74 u[IU]/mL (ref 0.350–4.500)

## 2014-02-23 MED ORDER — THIAMINE HCL 100 MG PO TABS: 100.0000 mg | ORAL_TABLET | Freq: Every day | ORAL | Status: DC

## 2014-02-23 MED ORDER — THIAMINE HCL 100 MG/ML IJ SOLN
100.0000 mg | Freq: Every day | INTRAMUSCULAR | Status: DC
  Filled 2014-02-23 (×2): qty 1

## 2014-02-23 MED ORDER — HYDROMORPHONE HCL 1 MG/ML IJ SOLN
1.0000 mg | INTRAMUSCULAR | Status: DC | PRN
  Administered 2014-02-23 – 2014-02-25 (×8): 1 mg via INTRAVENOUS
  Filled 2014-02-23 (×8): qty 1

## 2014-02-23 MED ORDER — ALBUTEROL SULFATE (2.5 MG/3ML) 0.083% IN NEBU: 3.0000 mL | INHALATION_SOLUTION | Freq: Four times a day (QID) | RESPIRATORY_TRACT | Status: DC | PRN

## 2014-02-23 MED ORDER — LEVALBUTEROL HCL 0.63 MG/3ML IN NEBU: 0.6300 mg | INHALATION_SOLUTION | Freq: Four times a day (QID) | RESPIRATORY_TRACT | Status: DC | PRN

## 2014-02-23 MED ORDER — SODIUM CHLORIDE 0.9 % IJ SOLN
3.0000 mL | Freq: Two times a day (BID) | INTRAMUSCULAR | Status: DC
  Administered 2014-02-25: 3 mL via INTRAVENOUS

## 2014-02-23 MED ORDER — SODIUM CHLORIDE 0.9 % IV BOLUS (SEPSIS)
1000.0000 mL | Freq: Once | INTRAVENOUS | Status: AC
Start: 2014-02-23 — End: 2014-02-23
  Administered 2014-02-23: 1000 mL via INTRAVENOUS

## 2014-02-23 MED ORDER — HYDRALAZINE HCL 20 MG/ML IJ SOLN: 5.0000 mg | Freq: Three times a day (TID) | INTRAMUSCULAR | Status: DC | PRN

## 2014-02-23 MED ORDER — PANTOPRAZOLE SODIUM 40 MG PO TBEC
40.0000 mg | DELAYED_RELEASE_TABLET | Freq: Two times a day (BID) | ORAL | Status: DC
Start: 2014-02-23 — End: 2014-02-25
  Administered 2014-02-23 – 2014-02-25 (×4): 40 mg via ORAL
  Filled 2014-02-23 (×4): qty 1

## 2014-02-23 MED ORDER — GI COCKTAIL ~~LOC~~
30.0000 mL | Freq: Once | ORAL | Status: AC
  Administered 2014-02-23: 30 mL via ORAL
  Filled 2014-02-23: qty 30

## 2014-02-23 MED ORDER — IOHEXOL 300 MG/ML  SOLN
25.0000 mL | Freq: Once | INTRAMUSCULAR | Status: AC | PRN
  Administered 2014-02-23: 25 mL via ORAL

## 2014-02-23 MED ORDER — PANCRELIPASE (LIP-PROT-AMYL) 12000-38000 UNITS PO CPEP
24000.0000 [IU] | ORAL_CAPSULE | Freq: Three times a day (TID) | ORAL | Status: DC
  Administered 2014-02-25: 24000 [IU] via ORAL
  Filled 2014-02-23 (×8): qty 2

## 2014-02-23 MED ORDER — HYDROMORPHONE HCL 1 MG/ML IJ SOLN
0.5000 mg | INTRAMUSCULAR | Status: DC | PRN
  Administered 2014-02-23: 0.5 mg via INTRAVENOUS
  Filled 2014-02-23: qty 1

## 2014-02-23 MED ORDER — METOPROLOL TARTRATE 25 MG PO TABS
25.0000 mg | ORAL_TABLET | Freq: Two times a day (BID) | ORAL | Status: DC
  Administered 2014-02-23 – 2014-02-25 (×4): 25 mg via ORAL
  Filled 2014-02-23 (×5): qty 1

## 2014-02-23 MED ORDER — SODIUM CHLORIDE 0.9 % IV SOLN
INTRAVENOUS | Status: DC
  Administered 2014-02-23 – 2014-02-24 (×4): via INTRAVENOUS
  Administered 2014-02-25: 125 mL/h via INTRAVENOUS
  Administered 2014-02-25: 05:00:00 via INTRAVENOUS

## 2014-02-23 MED ORDER — ENOXAPARIN SODIUM 40 MG/0.4ML ~~LOC~~ SOLN
40.0000 mg | SUBCUTANEOUS | Status: DC
  Administered 2014-02-24 – 2014-02-25 (×2): 40 mg via SUBCUTANEOUS
  Filled 2014-02-23 (×3): qty 0.4

## 2014-02-23 MED ORDER — ACETAMINOPHEN 650 MG RE SUPP: 650.0000 mg | Freq: Four times a day (QID) | RECTAL | Status: DC | PRN

## 2014-02-23 MED ORDER — LORAZEPAM 1 MG PO TABS: 1.0000 mg | ORAL_TABLET | Freq: Four times a day (QID) | ORAL | Status: DC | PRN

## 2014-02-23 MED ORDER — ADULT MULTIVITAMIN W/MINERALS CH
1.0000 | ORAL_TABLET | Freq: Every day | ORAL | Status: DC
  Administered 2014-02-23 – 2014-02-25 (×3): 1 via ORAL
  Filled 2014-02-23 (×3): qty 1

## 2014-02-23 MED ORDER — VITAMIN B-1 100 MG PO TABS
100.0000 mg | ORAL_TABLET | Freq: Every day | ORAL | Status: DC
  Administered 2014-02-23 – 2014-02-25 (×3): 100 mg via ORAL
  Filled 2014-02-23 (×3): qty 1

## 2014-02-23 MED ORDER — ONDANSETRON HCL 4 MG/2ML IJ SOLN: 4.0000 mg | Freq: Four times a day (QID) | INTRAMUSCULAR | Status: DC | PRN

## 2014-02-23 MED ORDER — ACETAMINOPHEN 325 MG PO TABS: 650.0000 mg | ORAL_TABLET | Freq: Four times a day (QID) | ORAL | Status: DC | PRN

## 2014-02-23 MED ORDER — ONDANSETRON HCL 4 MG PO TABS
4.0000 mg | ORAL_TABLET | Freq: Four times a day (QID) | ORAL | Status: DC | PRN
Start: 2014-02-23 — End: 2014-02-25

## 2014-02-23 MED ORDER — LORAZEPAM 2 MG/ML IJ SOLN
1.0000 mg | Freq: Four times a day (QID) | INTRAMUSCULAR | Status: DC | PRN
  Administered 2014-02-25: 1 mg via INTRAVENOUS
  Filled 2014-02-23: qty 1

## 2014-02-23 MED ORDER — SERTRALINE HCL 50 MG PO TABS
75.0000 mg | ORAL_TABLET | Freq: Every day | ORAL | Status: DC
  Administered 2014-02-23 – 2014-02-25 (×3): 75 mg via ORAL
  Filled 2014-02-23 (×3): qty 1

## 2014-02-23 MED ORDER — FOLIC ACID 1 MG PO TABS
1.0000 mg | ORAL_TABLET | Freq: Every day | ORAL | Status: DC
  Administered 2014-02-23 – 2014-02-25 (×3): 1 mg via ORAL
  Filled 2014-02-23 (×3): qty 1

## 2014-02-23 MED ORDER — HYDROMORPHONE HCL 1 MG/ML IJ SOLN
0.5000 mg | Freq: Once | INTRAMUSCULAR | Status: AC
  Administered 2014-02-23: 0.5 mg via INTRAVENOUS
  Filled 2014-02-23: qty 1

## 2014-02-23 MED ORDER — QUETIAPINE FUMARATE 100 MG PO TABS
100.0000 mg | ORAL_TABLET | Freq: Every day | ORAL | Status: DC
  Administered 2014-02-23 – 2014-02-24 (×2): 100 mg via ORAL
  Filled 2014-02-23 (×3): qty 1

## 2014-02-23 MED ORDER — IOHEXOL 300 MG/ML  SOLN
100.0000 mL | Freq: Once | INTRAMUSCULAR | Status: AC | PRN
  Administered 2014-02-23: 100 mL via INTRAVENOUS

## 2014-02-23 MED ORDER — MAGNESIUM SULFATE 40 MG/ML IJ SOLN
2.0000 g | Freq: Once | INTRAMUSCULAR | Status: AC
  Administered 2014-02-23: 2 g via INTRAVENOUS
  Filled 2014-02-23: qty 50

## 2014-02-23 NOTE — H&P (Signed)
[facility] History and Physical  [name] PNT:[id number] DOB: 1971/01/12 DOA: 02/23/2014  Referring physician  PCP: Angelica Chessman, MD   Chief Complaint: Abdominal pain  HPI:  This is a 43 y/o history of alcohol abuse, polysubstance abuse, hiatal hernia, hypertension, chronic pancreatitis with pseudocyst, anxiety, multiple hospitalizations of 8 admissions over the past 6 months and 12 ED visits over the past 6 months secondary to acute on chronic pancreatitis. PATIENT WAS RECENTLY DISCHARGED 9/27//2015  FOR ACUTE ON CHRONIC pancreatitis. He presents to the ED for the seventh time within the past 11 days complaining of "pancreatitis" abdominal pain, nausea, vomiting and diarrhea beginning around 12:30 AM today. Pt reports this feels the same as his prior pancreatitis flares. Reports he last had ETOH intake 3 days ago on Friday. States he had 3 episodes of non-bloody emesis and an episode of non-bloody diarrhea, last episode of emesis occuring about 3 hours  Prior to arrival. States he tried taking oxycodone with no relief. Denies fever, chills, chest pain or sob. It is noted he had labs drawn 3 days ago, and other than anemia, labs at baseline. Negative fecal occult blood. It is noted on chart review he frequently requests dilaudid for pain control.  Last abdominal CT scan on 01/09/14 short pancreatic edema and pseudocyst formation and 23 by 12 mm in size, lipase elevated at 161, WBC count of 9.4, liver function normal     Review of Systems: negative for the following  Constitutional: Denies fever, chills, diaphoresis, appetite change and fatigue.  HEENT: Denies photophobia, eye pain, redness, hearing loss, ear pain, congestion, sore throat, rhinorrhea, sneezing, mouth sores, trouble swallowing, neck pain, neck stiffness and tinnitus.  Respiratory: Denies SOB, DOE, cough, chest tightness, and wheezing.  Cardiovascular: Denies chest pain, palpitations and leg swelling.   Gastrointestinal: Denies nausea, vomiting, abdominal pain, diarrhea, constipation, blood in stool and abdominal distention.  Gastrointestinal: Positive for nausea, vomiting, abdominal pain and diarrhea. Musculoskeletal: Denies myalgias, back pain, joint swelling, arthralgias and gait problem.  Skin: Denies pallor, rash and wound.  Neurological: Denies dizziness, seizures, syncope, weakness, light-headedness, numbness and headaches.  Hematological: Denies adenopathy. Easy bruising, personal or family bleeding history  Psychiatric/Behavioral: Denies suicidal ideation, mood changes, confusion, nervousness, sleep disturbance and agitation       Past Medical History  Diagnosis Date  . Hypertension   . Asthma   . Pancreatitis   . Cocaine abuse   . Depression   . H/O suicide attempt   . Heart murmur     "when he was little" (03/06/2013)  . Shortness of breath     "can happen at anytime" (03/06/2013)  . Anemia   . H/O hiatal hernia   . GERD (gastroesophageal reflux disease)   . Anxiety   . WPW (Wolff-Parkinson-White syndrome)     [name] 03/06/2013  . High cholesterol   . Migraine     "monthly" (01/30/2014)  . Arthritis     "knees; arms; elbows" (01/30/2014)     Past Surgical History  Procedure Laterality Date  . Facial fracture surgery Left 1990's    "result of trauma"   . Eye surgery Left 1990's    "result of trauma"       Social History:  reports that he has been smoking Cigarettes.  He has a 30 pack-year smoking history. His smokeless tobacco use includes Chew. He reports that he drinks about 28.2 ounces of alcohol per week. He reports that he uses illicit drugs (Cocaine and Marijuana).  Allergies  Allergen Reactions  . Shellfish-Derived Products Nausea And Vomiting  . Trazodone And Nefazodone Other (See Comments)    Muscle spasms    Family History  Problem Relation Age of Onset  . Hypertension Other   . Coronary artery disease Other      Prior to  Admission medications   Medication Sig Start Date End Date Taking? Authorizing Provider  albuterol (PROVENTIL HFA;VENTOLIN HFA) 108 (90 BASE) MCG/ACT inhaler Inhale 2 puffs into the lungs every 6 (six) hours as needed for wheezing or shortness of breath.   Yes Historical Provider, MD  HYDROcodone-acetaminophen (NORCO/VICODIN) 5-325 MG per tablet Take 1-2 tablets by mouth every 6 (six) hours as needed for moderate pain. 02/10/14  Yes Mirna Mires, MD  lipase/protease/amylase (CREON) 12000 UNITS CPEP capsule Take 2 capsules (24,000 Units total) by mouth 3 (three) times daily with meals. 02/06/14  Yes [name], MD  loperamide (IMODIUM) 2 MG capsule Take 1-2 capsules (2-4 mg total) by mouth as needed for diarrhea or loose stools. 01/23/14  Yes [name], PA-C  metoprolol tartrate (LOPRESSOR) 25 MG tablet Take 1 tablet (25 mg total) by mouth 2 (two) times daily. 02/06/14  Yes [name], MD  Multiple Vitamin (MULTIVITAMIN) capsule Take 1 capsule by mouth daily. 02/06/14  Yes [name], MD  pantoprazole (PROTONIX) 40 MG tablet Take 1 tablet (40 mg total) by mouth 2 (two) times daily. 11/28/13  Yes [name], MD  promethazine (PHENERGAN) 25 MG tablet Take 1 tablet (25 mg total) by mouth every 6 (six) hours as needed for nausea or vomiting. 02/02/14  Yes Ripudeep Krystal Eaton, MD  QUEtiapine (SEROQUEL) 100 MG tablet Take 1 tablet (100 mg total) by mouth at bedtime. For mood control 01/15/14  Yes Shanker Kristeen Mans, MD  sertraline (ZOLOFT) 25 MG tablet Take 75 mg by mouth daily.   Yes Historical Provider, MD  thiamine 100 MG tablet Take 100 mg by mouth daily.    Yes Historical Provider, MD     Physical Exam: Filed Vitals:   02/23/14 1000 02/23/14 1030 02/23/14 1100 02/23/14 1122  BP: 124/70 134/62 148/88 148/88  Pulse: 106 114 112   Temp:      TempSrc:      Resp: 19 15 16 15   SpO2: 100% 98% 100% 100%     Constitutional: Vital signs reviewed. Patient is a  well-developed and well-nourished in no acute distress and cooperative with exam. Alert and oriented x3.  Head: Normocephalic and atraumatic  Ear: TM normal bilaterally  Mouth: no erythema or exudates, MMM  Eyes: PERRL, EOMI, conjunctivae normal, No scleral icterus.  Neck: Supple, Trachea midline normal ROM, No JVD, mass, thyromegaly, or carotid bruit present.  Cardiovascular: RRR, S1 normal, S2 normal, no MRG, pulses symmetric and intact bilaterally  Pulmonary/Chest: CTAB, no wheezes, rales, or rhonchi  Abdominal:Diffuse abdominal tenderness, more prominent mid-epigastric. No peritoneal signs GU: no CVA tenderness Musculoskeletal: No joint deformities, erythema, or stiffness, ROM full and no nontender Ext: no edema and no cyanosis, pulses palpable bilaterally (DP and PT)  Hematology: no cervical, inginal, or axillary adenopathy.  Neurological: A&O x3, Strenght is normal and symmetric bilaterally, cranial nerve II-XII are grossly intact, no focal motor deficit, sensory intact to light touch bilaterally.  Skin: Warm, dry and intact. No rash, cyanosis, or clubbing.  Psychiatric: Normal mood and affect. speech and behavior is normal. Judgment and thought content normal. Cognition and memory are normal.  Labs on Admission:    Basic Metabolic Panel:  Recent Labs Lab 02/23/14 0900  NA 138  K 3.6*  CL 99  CO2 24  GLUCOSE 95  BUN 10  CREATININE 0.55  CALCIUM 9.1   Liver Function Tests:  Recent Labs Lab 02/23/14 0900  AST 62*  ALT 46  ALKPHOS 124*  BILITOT 0.3  PROT 8.1  ALBUMIN 3.8    Recent Labs Lab 02/23/14 0900  LIPASE 161*   No results found for this basename: AMMONIA,  in the last 168 hours CBC:  Recent Labs Lab 02/23/14 0900  WBC 9.4  NEUTROABS 6.0  HGB 11.7*  HCT 33.9*  MCV 92.6  PLT 236   Cardiac Enzymes:  Recent Labs Lab 02/23/14 0900  TROPONINI <0.30    BNP (last 3 results) No results found for this basename: PROBNP,  in the last  8760 hours    CBG: No results found for this basename: GLUCAP,  in the last 168 hours  Radiological Exams on Admission: No results found.  EKG: Independently reviewed.   Assessment/Plan Active Problems:   Acute pancreatitis   1 abdominal pain/probable acute on chronic pancreatitis with history of pancreatic pseudocyst.  Obtain CT scan of the abdomen and pelvis, to rule out other causes of his abdominal pain, including evaluating his pseudocyst . abdominal pain may be likely secondary to acute on chronic pancreatitis.  Admit to telemetry given substance abuse and risk of alcohol withdrawal. Placed on IV fluids. Pain medications. Antiemetics, PPI. Supportive care. Once patient starts to improve clinically and be started on clear liquids and advance as tolerated.  #2 hypokalemia  Likely secondary to GI losses from emesis. Check a magnesium level. Replete.   #3 history of alcohol abuse  Alcohol cessation. We'll place patient on Ativan withdrawal protocol.  #4 hypertension  Avoid Lopressor/beta blocker given cocaine abuse, will use hydralazine as needed #5 depression  Continue home regimen.  #6 anemia  No overt bleeding. MCV is normocytic. Check an anemia panel. Follow H&H.  #7 tobacco abuse  Tobacco cessation. Place on a nicotine patch.  #8 prophylaxis  PPI for GI prophylaxis. Lovenox for DVT prophylaxis.      Code Status:   full Family Communication: bedside Disposition Plan: admit   Time spent: 70 mins   [facility] Pager [phone]  If 7PM-7AM, please contact night-coverage [email or web address] Password [email or web address] 02/23/2014, 11:58 AM

## 2014-02-23 NOTE — ED Notes (Signed)
Pt from home with c/o abdominal pain related to pancreatitis.  Pt has been seen for the same recently, has follow up appointment with a GI doctor next month.  Pt reports last drink was on Friday and cocaine use this weekend.  Pt states mild nausea, no vomiting, and mild diarrhea since eating last pm.  Pt in NAD, A&O.

## 2014-02-23 NOTE — ED Provider Notes (Signed)
CSN: [id number]     Arrival date & time 02/23/14  1696 History   First MD Initiated Contact with Patient 02/23/14 [phone]     Chief Complaint  Patient presents with  . Abdominal Pain     (Consider location/radiation/quality/duration/timing/severity/associated sxs/prior Treatment) The history is provided by the patient. No language interpreter was used.  [name] is a 43 y/o M with PMHx of polysubstance abuse, alcohol abuse, cocaine abuse, HTN, asthma, HCL, anemia, pancreatitis presenting to the ED with epigastric abdominal pain that has been ongoing for the past 9 days with worsening last night after eating a steak and potato dinner. Patient reported that the pain is localized to the epigastric region described as a sharp shooting pain that comes and goes. Stated that he has been taking Oxycodone with minimal relief. Reported that he has been having associated symptoms of nausea and diarrhea. Reported that he has been passing gas - reported that he had a normal bowel movement this morning. Stated that he last drank alcohol on Friday - reported two 24 ounce beers and used cocaine at least $40 worth, snorted. Smokes one pack of cigarettes per day. Denied heroin and marijuana use. Denied fever, chills, chest pain, shortness of breath, difficulty breathing, vomiting, melena, hematochezia, dysuria, hematuria. PCP Dr. Doreene Burke  Past Medical History  Diagnosis Date  . Hypertension   . Asthma   . Pancreatitis   . Cocaine abuse   . Depression   . H/O suicide attempt   . Heart murmur     "when he was little" (03/06/2013)  . Shortness of breath     "can happen at anytime" (03/06/2013)  . Anemia   . H/O hiatal hernia   . GERD (gastroesophageal reflux disease)   . Anxiety   . WPW (Wolff-Parkinson-White syndrome)     [name] 03/06/2013  . High cholesterol   . Migraine     "monthly" (01/30/2014)  . Arthritis     "knees; arms; elbows" (01/30/2014)   Past Surgical History  Procedure Laterality  Date  . Facial fracture surgery Left 1990's    "result of trauma"   . Eye surgery Left 1990's    "result of trauma"    Family History  Problem Relation Age of Onset  . Hypertension Other   . Coronary artery disease Other    History  Substance Use Topics  . Smoking status: Current Every Day Smoker -- 1.00 packs/day for 30 years    Types: Cigarettes  . Smokeless tobacco: Current User    Types: Chew  . Alcohol Use: 28.2 oz/week    47 Cans of beer per week     Comment: 01/30/2014 "I drink 2, 40oz  beers per day"    Review of Systems  Constitutional: Negative for fever and chills.  Respiratory: Negative for chest tightness and shortness of breath.   Cardiovascular: Negative for chest pain.  Gastrointestinal: Positive for nausea, abdominal pain and diarrhea. Negative for vomiting, constipation, blood in stool and anal bleeding.  Genitourinary: Negative for dysuria and hematuria.  Musculoskeletal: Negative for back pain and neck pain.  Neurological: Negative for dizziness, weakness, numbness and headaches.      Allergies  Shellfish-derived products and Trazodone and nefazodone  Home Medications   Prior to Admission medications   Medication Sig Start Date End Date Taking? Authorizing Provider  albuterol (PROVENTIL HFA;VENTOLIN HFA) 108 (90 BASE) MCG/ACT inhaler Inhale 2 puffs into the lungs every 6 (six) hours as needed for wheezing or shortness of breath.  Yes Historical Provider, MD  HYDROcodone-acetaminophen (NORCO/VICODIN) 5-325 MG per tablet Take 1-2 tablets by mouth every 6 (six) hours as needed for moderate pain. 02/10/14  Yes Mirna Mires, MD  lipase/protease/amylase (CREON) 12000 UNITS CPEP capsule Take 2 capsules (24,000 Units total) by mouth 3 (three) times daily with meals. 02/06/14  Yes [name], MD  loperamide (IMODIUM) 2 MG capsule Take 1-2 capsules (2-4 mg total) by mouth as needed for diarrhea or loose stools. 01/23/14  Yes [name], PA-C   metoprolol tartrate (LOPRESSOR) 25 MG tablet Take 1 tablet (25 mg total) by mouth 2 (two) times daily. 02/06/14  Yes [name], MD  Multiple Vitamin (MULTIVITAMIN) capsule Take 1 capsule by mouth daily. 02/06/14  Yes [name], MD  pantoprazole (PROTONIX) 40 MG tablet Take 1 tablet (40 mg total) by mouth 2 (two) times daily. 11/28/13  Yes [name], MD  promethazine (PHENERGAN) 25 MG tablet Take 1 tablet (25 mg total) by mouth every 6 (six) hours as needed for nausea or vomiting. 02/02/14  Yes Ripudeep Krystal Eaton, MD  QUEtiapine (SEROQUEL) 100 MG tablet Take 1 tablet (100 mg total) by mouth at bedtime. For mood control 01/15/14  Yes Shanker Kristeen Mans, MD  sertraline (ZOLOFT) 25 MG tablet Take 75 mg by mouth daily.   Yes Historical Provider, MD  thiamine 100 MG tablet Take 100 mg by mouth daily.    Yes Historical Provider, MD   BP 148/88  Pulse 112  Temp(Src) 98.1 F (36.7 C) (Oral)  Resp 15  SpO2 100% Physical Exam  Nursing note and vitals reviewed. Constitutional: He is oriented to person, place, and time. He appears well-developed and well-nourished. No distress.  HENT:  Head: Normocephalic and atraumatic.  Mouth/Throat: Oropharynx is clear and moist. No oropharyngeal exudate.  Eyes: Conjunctivae and EOM are normal. Pupils are equal, round, and reactive to light. Right eye exhibits no discharge. Left eye exhibits no discharge.  Neck: Normal range of motion. Neck supple. No tracheal deviation present.  Cardiovascular: Normal rate, regular rhythm and normal heart sounds.  Exam reveals no friction rub.   No murmur heard. Pulses:      Radial pulses are 2+ on the right side, and 2+ on the left side.  Pulmonary/Chest: Effort normal and breath sounds normal. No respiratory distress. He has no wheezes. He has no rales. He exhibits no tenderness.  Abdominal: Soft. Bowel sounds are normal. He exhibits no distension. There is tenderness in the epigastric area. There is no  rebound and no guarding.  Negative abdominal distension  BS normoactive in all 4 quadrants Abdomen soft upon palpation  Discomfort generalized upon palpation - most discomfort upon palpation to the epigastric  Voluntary rigidity noted upon palpation  Negative peritoneal signs  Musculoskeletal: Normal range of motion.  Lymphadenopathy:    He has no cervical adenopathy.  Neurological: He is alert and oriented to person, place, and time. No cranial nerve deficit. He exhibits normal muscle tone. Coordination normal.  Skin: Skin is warm and dry. No rash noted. He is not diaphoretic. No erythema.  Psychiatric: He has a normal mood and affect. His behavior is normal. Thought content normal.    ED Course  Procedures (including critical care time)  CLINICAL DATA: Pancreatitis with pseudocyst. Abdominal pain  EXAM:  CT ABDOMEN AND PELVIS WITH CONTRAST  TECHNIQUE:  Multidetector CT imaging of the abdomen and pelvis was performed  using the standard protocol following bolus administration of  intravenous  contrast.  CONTRAST: 5mL OMNIPAQUE IOHEXOL 300 MG/ML SOLN  COMPARISON: CT abdomen 12/26/2013  FINDINGS:  Lung bases are clear.  Fatty infiltration of the liver without focal liver lesion.  Gallbladder is elongated and distended but not thickened. Common  bile duct nondilated. Pancreas is small.  Changes of pancreatitis have improved. There is less peripancreatic  edema and pancreatic swelling compared with the prior study. 23 x 12  mm cyst in the uncinate process similar to the prior study  compatible with a pseudocyst.  Negative for bowel obstruction. Scattered air-fluid levels may  represent mild ileus. No ascites. Normal appendix.  Negative for mass or adenopathy. Prostate mildly enlarged. Mild  bladder wall thickening unchanged.  No acute bony change. Disc degeneration in the lumbar spine  especially L5-S1.  IMPRESSION:  Interval improvement in pancreatic edema and changes of   pancreatitis. 23 x 12 mm pseudocyst in the uncinate process similar  to the prior study. No ascites.  Fatty liver  Electronically Signed  By: Franchot Gallo M.D.  On: 01/09/2014 13:43  Results for orders placed during the hospital encounter of 02/23/14  CBC WITH DIFFERENTIAL      Result Value Ref Range   WBC 9.4  4.0 - 10.5 K/uL   RBC 3.66 (*) 4.22 - 5.81 MIL/uL   Hemoglobin 11.7 (*) 13.0 - 17.0 g/dL   HCT 33.9 (*) 39.0 - 52.0 %   MCV 92.6  78.0 - 100.0 fL   MCH 32.0  26.0 - 34.0 pg   MCHC 34.5  30.0 - 36.0 g/dL   RDW 16.4 (*) 11.5 - 15.5 %   Platelets 236  150 - 400 K/uL   Neutrophils Relative % 65  43 - 77 %   Neutro Abs 6.0  1.7 - 7.7 K/uL   Lymphocytes Relative 24  12 - 46 %   Lymphs Abs 2.3  0.7 - 4.0 K/uL   Monocytes Relative 8  3 - 12 %   Monocytes Absolute 0.7  0.1 - 1.0 K/uL   Eosinophils Relative 3  0 - 5 %   Eosinophils Absolute 0.3  0.0 - 0.7 K/uL   Basophils Relative 0  0 - 1 %   Basophils Absolute 0.0  0.0 - 0.1 K/uL  COMPREHENSIVE METABOLIC PANEL      Result Value Ref Range   Sodium 138  137 - 147 mEq/L   Potassium 3.6 (*) 3.7 - 5.3 mEq/L   Chloride 99  96 - 112 mEq/L   CO2 24  19 - 32 mEq/L   Glucose, Bld 95  70 - 99 mg/dL   BUN 10  6 - 23 mg/dL   Creatinine, Ser 0.55  0.50 - 1.35 mg/dL   Calcium 9.1  8.4 - 10.5 mg/dL   Total Protein 8.1  6.0 - 8.3 g/dL   Albumin 3.8  3.5 - 5.2 g/dL   AST 62 (*) 0 - 37 U/L   ALT 46  0 - 53 U/L   Alkaline Phosphatase 124 (*) 39 - 117 U/L   Total Bilirubin 0.3  0.3 - 1.2 mg/dL   GFR calc non Af Amer >90  >90 mL/min   GFR calc Af Amer >90  >90 mL/min   Anion gap 15  5 - 15  LIPASE, BLOOD      Result Value Ref Range   Lipase 161 (*) 11 - 59 U/L  ETHANOL      Result Value Ref Range   Alcohol, Ethyl (B) <11  0 -  11 mg/dL  URINALYSIS, ROUTINE W REFLEX MICROSCOPIC      Result Value Ref Range   Color, Urine YELLOW  YELLOW   APPearance CLEAR  CLEAR   Specific Gravity, Urine >1.030 (*) 1.005 - 1.030   pH 6.0  5.0 - 8.0    Glucose, UA NEGATIVE  NEGATIVE mg/dL   Hgb urine dipstick NEGATIVE  NEGATIVE   Bilirubin Urine NEGATIVE  NEGATIVE   Ketones, ur NEGATIVE  NEGATIVE mg/dL   Protein, ur NEGATIVE  NEGATIVE mg/dL   Urobilinogen, UA 0.2  0.0 - 1.0 mg/dL   Nitrite NEGATIVE  NEGATIVE   Leukocytes, UA NEGATIVE  NEGATIVE  URINE RAPID DRUG SCREEN (HOSP PERFORMED)      Result Value Ref Range   Opiates NONE DETECTED  NONE DETECTED   Cocaine POSITIVE (*) NONE DETECTED   Benzodiazepines POSITIVE (*) NONE DETECTED   Amphetamines NONE DETECTED  NONE DETECTED   Tetrahydrocannabinol NONE DETECTED  NONE DETECTED   Barbiturates POSITIVE (*) NONE DETECTED  TROPONIN I      Result Value Ref Range   Troponin I <0.30  <0.30 ng/mL    Labs Review Labs Reviewed  CBC WITH DIFFERENTIAL - Abnormal; Notable for the following:    RBC 3.66 (*)    Hemoglobin 11.7 (*)    HCT 33.9 (*)    RDW 16.4 (*)    All other components within normal limits  COMPREHENSIVE METABOLIC PANEL - Abnormal; Notable for the following:    Potassium 3.6 (*)    AST 62 (*)    Alkaline Phosphatase 124 (*)    All other components within normal limits  LIPASE, BLOOD - Abnormal; Notable for the following:    Lipase 161 (*)    All other components within normal limits  URINALYSIS, ROUTINE W REFLEX MICROSCOPIC - Abnormal; Notable for the following:    Specific Gravity, Urine >1.030 (*)    All other components within normal limits  URINE RAPID DRUG SCREEN (HOSP PERFORMED) - Abnormal; Notable for the following:    Cocaine POSITIVE (*)    Benzodiazepines POSITIVE (*)    Barbiturates POSITIVE (*)    All other components within normal limits  ETHANOL  TROPONIN I  CBC  CREATININE, SERUM  MAGNESIUM  TROPONIN I  TROPONIN I  TROPONIN I  TSH  HEMOGLOBIN A1C    Imaging Review No results found.   EKG Interpretation   Date/Time:  Sunday February 23 2014 08:48:33 EDT Ventricular Rate:  102 PR Interval:  104 QRS Duration: 72 QT Interval:  305 QTC  Calculation: 397 R Axis:   71 Text Interpretation:  Sinus tachycardia Biatrial enlargement Abnormal T,  consider ischemia, diffuse leads No significant change was found Confirmed  by WOFFORD  MD, TREY ([id number]) on 02/23/2014 9:47:32 AM      11 :16 AM This provider was made aware that the gi cocktail did not help the patient's pain. Pain medications ordered.   11:50 AM This provider spoke with Dr. Derrek Gu - discussed case, labs, imaging, vitals, ED course in great detail. Patient to be admitted for pain control and monitoring to Telemetry.   MDM   Final diagnoses:  Alcohol induced acute pancreatitis  Alcohol abuse  Alcohol withdrawal, with unspecified complication    Medications  enoxaparin (LOVENOX) injection 40 mg (not administered)  sodium chloride 0.9 % injection 3 mL (not administered)  0.9 %  sodium chloride infusion (not administered)  acetaminophen (TYLENOL) tablet 650 mg (not administered)    Or  acetaminophen (  TYLENOL) suppository 650 mg (not administered)  HYDROmorphone (DILAUDID) injection 0.5 mg (not administered)  ondansetron (ZOFRAN) tablet 4 mg (not administered)    Or  ondansetron (ZOFRAN) injection 4 mg (not administered)  levalbuterol (XOPENEX) nebulizer solution 0.63 mg (not administered)  sodium chloride 0.9 % bolus 1,000 mL (0 mLs Intravenous Stopped 02/23/14 0943)  gi cocktail (Maalox,Lidocaine,Donnatal) (30 mLs Oral Given 02/23/14 1031)  HYDROmorphone (DILAUDID) injection 0.5 mg (0.5 mg Intravenous Given 02/23/14 1128)  sodium chloride 0.9 % bolus 1,000 mL (1,000 mLs Intravenous New Bag/Given 02/23/14 1128)   Filed Vitals:   02/23/14 1000 02/23/14 1030 02/23/14 1100 02/23/14 1122  BP: 124/70 134/62 148/88 148/88  Pulse: 106 114 112   Temp:      TempSrc:      Resp: 19 15 16 15   SpO2: 100% 98% 100% 100%   This provider reviewed the patient's chart. Patient has been seen and assessed in the ED setting numerous times regarding epigastric, abdominal pain,  and alcohol/polysubstance abuse. Patient last seen on 02/16/2014 for the same and discharged home. Patient had a CT abdomen and pelvis with contrast performed on 02/05/2014 that identified improvement of pancreatic edema and pancreatitis with pseudocyst noted that has been seen before on prior studies.  EKG noted sinus tachycardia with a heart rate 102 beats per minutes-can change since last tracing. Troponin negative elevation. CBC unremarkable-hemoglobin 11.7, hematocrit 33.9, patient does have history of anemia. CMP noted mildly low potassium 3.6. Elevated AST and alkaline phosphatase-AST 62, alkaline phosphatase 124. When compared to previous labs AST and alkaline phosphatase has always been elevated. Lipase 161 - highest this lipase has been. Urinalysis negative for hemoglobin, nitrites, leukocytes-negative findings of infection. Urine drug screen positive for benzo, barbiturates, cocaine. While in ED setting patient became tachycardic with a heart rate of 116 beats per minute. Patient started to have shakes and tremors. Patient reported that he has not had any alcohol since Friday night - stated that he normally drinks every day. Reported that his abdominal pain continues. Lipase elevated, highest it has been with no control of pain - acute exacerbation of chronic pancreatitis due to alcohol abuse. Concern for alcohol withdrawls secondary to tachycardia with shakes and tremors. Discussed plan for admission with patient who agrees to plan of care. Patient understood. [facility] to admit patient to Telemetry floor.   Jamse Mead, PA-C 02/23/14 1159

## 2014-02-23 NOTE — ED Notes (Signed)
Patient transported to CT. Transporter to have RN charge called so that NT may transport pt up to unit.

## 2014-02-23 NOTE — Progress Notes (Signed)
[name] [id number] Code Status: full   Admission Data: 02/23/2014 4:15 PM Attending Provider:  Allyson Sabal TYO:MAYOKH, Gabrielle Dare, MD Consults/ Treatment Team:    [name] is a 43 y.o. male patient admitted from ED awake, alert - oriented  X 3 - no acute distress noted.  VSS - Blood pressure 150/98, pulse 81, temperature 98.1 F (36.7 C), temperature source Oral, resp. rate 18, height 5\' 9"  (1.753 m), weight 61.871 kg (136 lb 6.4 oz), SpO2 100.00%.    IV in place, occlusive dsg intact without redness.  Orientation to room, and floor completed with information packet given to patient/family.  Patient declined safety video at this time.  Admission INP armband ID verified with patient/family, and in place.   SR up x 2, fall assessment complete, with patient and family able to verbalize understanding of risk associated with falls, and verbalized understanding to call nsg before up out of bed.  Call light within reach, patient able to voice, and demonstrate understanding.  Skin, clean-dry- intact without evidence of bruising, or skin tears.   No evidence of skin break down noted on exam.     Will cont to eval and treat per MD orders.  [name], Helen Hashimoto, RN 02/23/2014 4:15 PM

## 2014-02-24 DIAGNOSIS — F10239 Alcohol dependence with withdrawal, unspecified: Secondary | ICD-10-CM

## 2014-02-24 DIAGNOSIS — I10 Essential (primary) hypertension: Secondary | ICD-10-CM

## 2014-02-24 DIAGNOSIS — F1014 Alcohol abuse with alcohol-induced mood disorder: Secondary | ICD-10-CM

## 2014-02-24 LAB — COMPREHENSIVE METABOLIC PANEL
ALT: 34 U/L (ref 0–53)
AST: 38 U/L — AB (ref 0–37)
Albumin: 3 g/dL — ABNORMAL LOW (ref 3.5–5.2)
Alkaline Phosphatase: 108 U/L (ref 39–117)
Anion gap: 13 (ref 5–15)
BUN: 6 mg/dL (ref 6–23)
CALCIUM: 8.3 mg/dL — AB (ref 8.4–10.5)
CO2: 20 meq/L (ref 19–32)
CREATININE: 0.49 mg/dL — AB (ref 0.50–1.35)
Chloride: 104 mEq/L (ref 96–112)
Glucose, Bld: 87 mg/dL (ref 70–99)
Potassium: 3.6 mEq/L — ABNORMAL LOW (ref 3.7–5.3)
Sodium: 137 mEq/L (ref 137–147)
Total Bilirubin: 0.5 mg/dL (ref 0.3–1.2)
Total Protein: 6.8 g/dL (ref 6.0–8.3)

## 2014-02-24 LAB — HEMOGLOBIN A1C
Hgb A1c MFr Bld: 4.8 % (ref <5.7)
Mean Plasma Glucose: 91 mg/dL (ref <117)

## 2014-02-24 LAB — TROPONIN I

## 2014-02-24 LAB — CBC
HCT: 30.9 % — ABNORMAL LOW (ref 39.0–52.0)
Hemoglobin: 10.3 g/dL — ABNORMAL LOW (ref 13.0–17.0)
MCH: 31.2 pg (ref 26.0–34.0)
MCHC: 33.3 g/dL (ref 30.0–36.0)
MCV: 93.6 fL (ref 78.0–100.0)
PLATELETS: 194 10*3/uL (ref 150–400)
RBC: 3.3 MIL/uL — AB (ref 4.22–5.81)
RDW: 16.3 % — ABNORMAL HIGH (ref 11.5–15.5)
WBC: 9 10*3/uL (ref 4.0–10.5)

## 2014-02-24 MED ORDER — GI COCKTAIL ~~LOC~~
30.0000 mL | Freq: Once | ORAL | Status: AC
  Administered 2014-02-24: 30 mL via ORAL
  Filled 2014-02-24: qty 30

## 2014-02-24 MED ORDER — ALBUTEROL SULFATE (2.5 MG/3ML) 0.083% IN NEBU: 2.5000 mg | INHALATION_SOLUTION | RESPIRATORY_TRACT | Status: DC | PRN

## 2014-02-24 MED ORDER — LORATADINE 10 MG PO TABS
10.0000 mg | ORAL_TABLET | Freq: Every day | ORAL | Status: DC
Start: 2014-02-24 — End: 2014-02-25
  Administered 2014-02-24 – 2014-02-25 (×2): 10 mg via ORAL
  Filled 2014-02-24 (×2): qty 1

## 2014-02-24 MED ORDER — ALBUTEROL SULFATE HFA 108 (90 BASE) MCG/ACT IN AERS: 1.0000 | INHALATION_SPRAY | RESPIRATORY_TRACT | Status: DC | PRN

## 2014-02-24 MED ORDER — MAGNESIUM SULFATE 40 MG/ML IJ SOLN
2.0000 g | Freq: Once | INTRAMUSCULAR | Status: AC
  Administered 2014-02-24: 2 g via INTRAVENOUS
  Filled 2014-02-24: qty 50

## 2014-02-24 MED ORDER — POTASSIUM CHLORIDE 10 MEQ/100ML IV SOLN
10.0000 meq | INTRAVENOUS | Status: AC
  Administered 2014-02-24 (×4): 10 meq via INTRAVENOUS
  Filled 2014-02-24 (×4): qty 100

## 2014-02-24 NOTE — Progress Notes (Addendum)
PROGRESS NOTE  LORI LIEW KGY:[id number] DOB: 06/02/1970 DOA: 02/23/2014 PCP: Angelica Chessman, MD  HPI/Subjective: 43yo male with hx of recurrent chronic pancreatitis and polysubstance abuse.     Assessment/Plan:  Acute on chronic pancreatitis: Felt to be due to recurrent polysubstance abuse.  Lipase mildly elevated (161) CT scan with increased possible pseudocysts Pt NPO, pain meds, ambulatory.  Polysubstance abuse: Positive for cocaine, marijuana, and benzos No signs of withdrawal Social work has seen multiple times in the past.  Counseled cessation.  Nicotine patch.   HTN: Currently stable. Continue Metoprolol  Given cocaine abuse will reevaluate metoprolol prior to d/c.  Asthma: Stable. Albuterol inhaler.  Hypokalemia: Repleted IV. (3.6)     DVT Prophylaxis:  Lovenox.    Code Status: Full code Family Communication: Pt is alert and oriented. Disposition Plan: Will return to home when appropriate.    Consultants:  None  Procedures:  None  Antibiotics:  None  Objective: Filed Vitals:   02/23/14 1817 02/23/14 2135 02/24/14 0613 02/24/14 0901  BP: 141/84 150/81 126/77 131/77  Pulse: 96 95 67 71  Temp: 98.2 F (36.8 C) 98 F (36.7 C) 97.4 F (36.3 C)   TempSrc: Oral Oral Oral   Resp: 16 16 14    Height:      Weight:      SpO2: 100% 99% 99%     Intake/Output Summary (Last 24 hours) at 02/24/14 1203 Last data filed at 02/23/14 2137  Gross per 24 hour  Intake      0 ml  Output    550 ml  Net   -550 ml   Filed Weights   02/23/14 1431  Weight: 61.871 kg (136 lb 6.4 oz)    Exam: General: Well developed, NAD, appears stated age  41:  EOMI, Anicteic Sclera, MMM. Neck: Supple, no JVD, no masses  Cardiovascular: RRR, S1 S2 auscultated, no rubs, murmurs or gallops.   Respiratory: Clear to auscultation bilaterally with equal chest rise  Abdomen: Soft, minimally tender in RLQ, nondistended, + bowel sounds  Extremities: warm dry  without cyanosis clubbing or edema.  Psych: Normal affect and demeanor with intact judgement and insight   Data Reviewed: Basic Metabolic Panel:  Recent Labs Lab 02/23/14 0900 02/23/14 1227 02/24/14 0036  NA 138  --  137  K 3.6*  --  3.6*  CL 99  --  104  CO2 24  --  20  GLUCOSE 95  --  87  BUN 10  --  6  CREATININE 0.55  --  0.49*  CALCIUM 9.1  --  8.3*  MG  --  1.6  --    Liver Function Tests:  Recent Labs Lab 02/23/14 0900 02/24/14 0036  AST 62* 38*  ALT 46 34  ALKPHOS 124* 108  BILITOT 0.3 0.5  PROT 8.1 6.8  ALBUMIN 3.8 3.0*    Recent Labs Lab 02/23/14 0900  LIPASE 161*   CBC:  Recent Labs Lab 02/23/14 0900 02/24/14 0036  WBC 9.4 9.0  NEUTROABS 6.0  --   HGB 11.7* 10.3*  HCT 33.9* 30.9*  MCV 92.6 93.6  PLT 236 194   Cardiac Enzymes:  Recent Labs Lab 02/23/14 0900 02/23/14 1227 02/23/14 1740 02/24/14 0035  TROPONINI <0.30 <0.30 <0.30 <0.30    Studies: Ct Abdomen Pelvis W Contrast  02/23/2014   CLINICAL DATA:  Initial evaluation left upper quadrant pain, left flank pain, similar to prior episodes of pancreatitis, personal history of substance abuse, pancreatitis, and hypertension  EXAM: CT ABDOMEN AND PELVIS WITH CONTRAST  TECHNIQUE: Multidetector CT imaging of the abdomen and pelvis was performed using the standard protocol following bolus administration of intravenous contrast.  CONTRAST:  133mL OMNIPAQUE IOHEXOL 300 MG/ML  SOLN  COMPARISON:  None.  FINDINGS: Visualized portions of the lung bases clear. Visualized portions of the are normal.  Mild diffuse fatty infiltration of the liver. Gallbladder is normal. Spleen is normal.  Adrenal glands are normal. Kidneys are normal. Mild calcification of the abdominal aorta.  Stomach, small bowel, and large bowel are normal.  Bladder and reproductive organs are normal. No acute musculoskeletal findings. No ascites.  There is mild inflammatory change around the distal body and tail the pancreas. The tail  is prominent with a diameter of 3 cm. There are numerous tiny cystic areas in the tail of the pancreas center new from prior study. In the body of the pancreas there is 16 mm cystic area increased about 13 mm previously. A cystic lesion in the uncinate process measures 28 x 19 mm, increased from 13 x 23 mm previously.  IMPRESSION: Findings most consistent with acute pancreatitis. There are numerous tiny cystic lesions in the tail the pancreas that were not present previously. These could represent small pseudocysts. There is also interval enlargement of 2 more discrete pancreatic cystic lesions as described above. These may represent enlarged pseudocysts as well. The possibility of papillary intraductal mucinous tumor is not excluded and pancreatic MRI would be suggested   Electronically Signed   By: Skipper Cliche M.D.   On: 02/23/2014 14:25    Scheduled Meds: . enoxaparin (LOVENOX) injection  40 mg Subcutaneous Q24H  . folic acid  1 mg Oral Daily  . lipase/protease/amylase  24,000 Units Oral TID WC  . metoprolol tartrate  25 mg Oral BID  . multivitamin with minerals  1 tablet Oral Daily  . pantoprazole  40 mg Oral BID  . QUEtiapine  100 mg Oral QHS  . sertraline  75 mg Oral Daily  . sodium chloride  3 mL Intravenous Q12H  . thiamine  100 mg Oral Daily   Or  . thiamine  100 mg Intravenous Daily   Continuous Infusions: . sodium chloride 125 mL/hr at 02/24/14 0532    Principal Problem:   Acute pancreatitis Active Problems:   TOBACCO ABUSE   Asthma, chronic   Alcohol abuse with alcohol-induced mood disorder   Chronic pancreatitis   Pancreatic pseudocyst/cyst   Hypokalemia    Adelene Idler PA-S  [facility] Pager [phone]. If 7PM-7AM, please contact night-coverage at [email or web address], password [facility] 02/24/2014, 12:03 PM  LOS: 1 day   Addendum  Patient seen and examined, chart and data base reviewed.  I agree with the above assessment and plan.  For full details please  see Mrs. Adelene Idler PA-S note dictated by Mrs. Imogene Burn PA-C.  I reviewed and amended the above note as appropriate.   Birdie Hopes, MD [facility] Pager: [phone] 02/24/2014, 4:17 PM

## 2014-02-24 NOTE — Progress Notes (Signed)
Pt walking around unit and standing outside his old room(room17) wondering why he can't be in there. He is refusing camera room and refusing to sign out AMA at this time. Patient was told that if he walks past the elevators or off unit that he is leaving against medical advice. Dr. Hartford Poli notified. Wyonia Hough, Surveyor, quantity also had a conversation with him about the above.

## 2014-02-24 NOTE — ED Provider Notes (Signed)
Medical screening examination/treatment/procedure(s) were conducted as a shared visit with non-physician practitioner(s) and myself.  I personally evaluated the patient during the encounter.   EKG Interpretation   Date/Time:  Sunday February 23 2014 08:48:33 EDT Ventricular Rate:  102 PR Interval:  104 QRS Duration: 72 QT Interval:  305 QTC Calculation: 397 R Axis:   71 Text Interpretation:  Sinus tachycardia Biatrial enlargement Abnormal T,  consider ischemia, diffuse leads No significant change was found Confirmed  by Mikkel Charrette  MD, TREY ([id number]) on 02/23/2014 9:47:32 AM      43  yo male with history of alcohol abuse and chronic pancreatitis presenting with epigastric abdominal pain.  On exam, well appearing, nontoxic, not distressed, normal respiratory effort, normal perfusion, abdomen soft but tender in epigastrium, without r/r/g.  Symptoms difficult to control, lipase elevated.  Plan admit.    Clinical Impression: 1. Alcohol induced acute pancreatitis   2. Alcohol abuse   3. Alcohol withdrawal, with unspecified complication   4. Abdominal pain       Artis Delay, MD 02/24/14 1409

## 2014-02-24 NOTE — Progress Notes (Signed)
Patient taped glove on camera in room. Refuses to be on video.

## 2014-02-24 NOTE — Progress Notes (Signed)
NT informed RN that patient was seen putting something in his pockets that he took off of the tray cart in the hallway. Patient was asked if he had been eating and he denied having anything. Patient educated that bowel rest is ordered for pancreatitis and that his diet orders currently are for nothing by mouth. Patient verbalized understanding of this and maintained that he had not eaten anything, but that he did have ice chips. He stated he was told by a nurse earlier in the day he could have ice chips. Will continue to monitor.

## 2014-02-24 NOTE — ED Provider Notes (Signed)
Medical screening examination/treatment/procedure(s) were conducted as a shared visit with non-physician practitioner(s) and myself.  I personally evaluated the patient during the encounter.   EKG Interpretation   Date/Time:  Sunday February 23 2014 08:48:33 EDT Ventricular Rate:  102 PR Interval:  104 QRS Duration: 72 QT Interval:  305 QTC Calculation: 397 R Axis:   71 Text Interpretation:  Sinus tachycardia Biatrial enlargement Abnormal T,  consider ischemia, diffuse leads No significant change was found Confirmed  by [facility]  MD, TREY ([id number]) on 02/23/2014 9:47:32 AM        Artis Delay, MD 02/24/14 1409

## 2014-02-24 NOTE — Progress Notes (Addendum)
Patient calls out for pain medication 1-2 hours before pain medication can be administered. RN went to talk to patient after he called out for pain medication about the frequency of his medication and patient was sleeping. Patient woke up again, and called out for pain medication when it could be given, but patient was asleep when RN went to room to administer med minutes after patient called out. Will continue to monitor.

## 2014-02-24 NOTE — Progress Notes (Signed)
Nurse tech found a salad in patient room which he stole off the food cart last night. Pt educated again on the importance of staying NPO for bowel rest.

## 2014-02-25 LAB — BASIC METABOLIC PANEL
ANION GAP: 11 (ref 5–15)
BUN: 3 mg/dL — AB (ref 6–23)
CO2: 23 mEq/L (ref 19–32)
Calcium: 8.4 mg/dL (ref 8.4–10.5)
Chloride: 108 mEq/L (ref 96–112)
Creatinine, Ser: 0.57 mg/dL (ref 0.50–1.35)
GFR calc Af Amer: >90 mL/min (ref >90)
GFR calc non Af Amer: >90 mL/min (ref >90)
Glucose, Bld: 150 mg/dL — ABNORMAL HIGH (ref 70–99)
POTASSIUM: 3.7 meq/L (ref 3.7–5.3)
Sodium: 142 mEq/L (ref 137–147)

## 2014-02-25 MED ORDER — OXYCODONE HCL 5 MG PO TABS
5.0000 mg | ORAL_TABLET | ORAL | Status: DC | PRN
  Administered 2014-02-25 (×2): 10 mg via ORAL
  Filled 2014-02-25 (×2): qty 2

## 2014-02-25 MED ORDER — LORATADINE 10 MG PO TABS: 10.0000 mg | ORAL_TABLET | Freq: Every day | ORAL | Status: DC

## 2014-02-25 MED ORDER — HYDROCODONE-ACETAMINOPHEN 5-325 MG PO TABS: 1.0000 | ORAL_TABLET | Freq: Four times a day (QID) | ORAL | Status: DC | PRN

## 2014-02-25 MED ORDER — PROMETHAZINE HCL 25 MG PO TABS: 25.0000 mg | ORAL_TABLET | Freq: Four times a day (QID) | ORAL | Status: DC | PRN

## 2014-02-25 NOTE — Discharge Summary (Signed)
Physician Discharge Summary  [name] DQQ:[id number] DOB: 22-Aug-1970 DOA: 02/23/2014  PCP: Angelica Chessman, MD  Admit date: 02/23/2014 Discharge date: 02/25/2014  Time spent: 45 minutes  Recommendations for Outpatient Follow-up:  1. CT abdomen/pelvis needs to be repeated in 1 month. - patient with enlarging pseudocysts. 2. Alcohol / cocaine cessation 3. Patient eating full liquids on discharge.  Requesting discharge.    Discharge Diagnoses:  Principal Problem:   Acute pancreatitis Active Problems:   TOBACCO ABUSE   Asthma, chronic   Alcohol abuse with alcohol-induced mood disorder   Chronic pancreatitis   Pancreatic pseudocyst/cyst   Hypokalemia   Discharge Condition: stable.  Diet recommendation: full liquids then advance to soft diet, low fat, small portions.  Filed Weights   02/23/14 1431  Weight: 61.871 kg (136 lb 6.4 oz)    History of present illness:  This is a 42 y/o history of alcohol abuse, polysubstance abuse, hiatal hernia, hypertension, chronic pancreatitis with pseudocyst, anxiety, multiple hospitalizations of 8 admissions over the past 6 months and 12 ED visits over the past 6 months secondary to acute on chronic pancreatitis. He presents to the ED for the seventh time within the past 11 days complaining of "pancreatitis" abdominal pain, nausea, vomiting and diarrhea beginning around 12:30 AM today.  Reports he last had ETOH intake 3 days ago on Friday. States he had 3 episodes of non-bloody emesis and an episode of non-bloody diarrhea, last episode of emesis occuring about 3 hours Prior to arrival. States he tried taking oxycodone with no relief. Denies fever, chills, chest pain or sob. It is noted he had labs drawn 3 days ago, and other than anemia, labs at baseline. Negative fecal occult blood. It is noted on chart review he frequently requests dilaudid for pain control.   Last abdominal CT scan on 01/09/14 showed pancreatic edema and pseudocyst  formation and 23 by 12 mm in size, lipase elevated at 161, WBC count of 9.4, liver function normal.    Hospital Course:  Acute on chronic pancreatitis:  Felt to be due to recurrent polysubstance abuse.  Lipase mildly elevated (161)  CT scan with increased pseudocysts  Pt NPO, pain meds, ambulatory.   Polysubstance abuse:  Positive for cocaine, marijuana, and benzos  No signs of withdrawal  Social work has seen multiple times in the past.  Counseled cessation. Clearly explained to patient that alcohol will inflame his pancreas. He commits to stop drinking (states his daughter and grandson now live with him which he states will discourage him from drinking)  HTN:  Currently stable.  Continue Metoprolol patient with a history of WPW syndrome. Given cocaine use patient was strongly counseled.  He commits to no further use of cocaine.  Asthma:  Stable.  Albuterol inhaler.   Hypokalemia:  Repleted IV. K is 3.7 on discharge.     Procedures:  none  Consultations:  none  Discharge Exam: Filed Vitals:   02/25/14 1200  BP: 134/95  Pulse: 80  Temp: 98.9 F (37.2 C)  Resp: 16   General: Well developed, NAD, appears stated age.  Patient ambulating in the hallway. HEENT: EOMI, Anicteic Sclera, MMM.  Neck: Supple, no JVD, no masses  Cardiovascular: RRR, S1 S2 auscultated, no rubs, murmurs or gallops.  Respiratory: Clear to auscultation bilaterally with equal chest rise  Abdomen: Soft, non tender, nondistended, + bowel sounds  Extremities: warm dry without cyanosis clubbing or edema.  Psych: Normal affect and demeanor with intact judgement and insight  Discharge Instructions   Discharge Instructions   Diet general    Complete by:  As directed   Small Portions Low Fat meals     Increase activity slowly    Complete by:  As directed           Current Discharge Medication List    START taking these medications   Details  loratadine (CLARITIN) 10 MG tablet Take  1 tablet (10 mg total) by mouth daily.      CONTINUE these medications which have CHANGED   Details  HYDROcodone-acetaminophen (NORCO/VICODIN) 5-325 MG per tablet Take 1-2 tablets by mouth every 6 (six) hours as needed for moderate pain. Qty: 30 tablet, Refills: 0    promethazine (PHENERGAN) 25 MG tablet Take 1 tablet (25 mg total) by mouth every 6 (six) hours as needed for nausea or vomiting. Qty: 30 tablet, Refills: 0      CONTINUE these medications which have NOT CHANGED   Details  albuterol (PROVENTIL HFA;VENTOLIN HFA) 108 (90 BASE) MCG/ACT inhaler Inhale 2 puffs into the lungs every 6 (six) hours as needed for wheezing or shortness of breath.    lipase/protease/amylase (CREON) 12000 UNITS CPEP capsule Take 2 capsules (24,000 Units total) by mouth 3 (three) times daily with meals. Qty: 270 capsule, Refills: 3   Associated Diagnoses: Alcohol-induced acute pancreatitis    loperamide (IMODIUM) 2 MG capsule Take 1-2 capsules (2-4 mg total) by mouth as needed for diarrhea or loose stools. Qty: 30 capsule, Refills: 0    metoprolol tartrate (LOPRESSOR) 25 MG tablet Take 1 tablet (25 mg total) by mouth 2 (two) times daily. Qty: 180 tablet, Refills: 3   Associated Diagnoses: Essential hypertension    Multiple Vitamin (MULTIVITAMIN) capsule Take 1 capsule by mouth daily. Qty: 90 capsule, Refills: 3   Associated Diagnoses: Alcohol-induced acute pancreatitis    pantoprazole (PROTONIX) 40 MG tablet Take 1 tablet (40 mg total) by mouth 2 (two) times daily. Qty: 60 tablet, Refills: 3   Associated Diagnoses: Abdominal pain, unspecified site    QUEtiapine (SEROQUEL) 100 MG tablet Take 1 tablet (100 mg total) by mouth at bedtime. For mood control Qty: 30 tablet, Refills: 0    sertraline (ZOLOFT) 25 MG tablet Take 75 mg by mouth daily.    thiamine 100 MG tablet Take 100 mg by mouth daily.        Allergies  Allergen Reactions  . Shellfish-Derived Products Nausea And Vomiting  .  Trazodone And Nefazodone Other (See Comments)    Muscle spasms   Follow-up Information   Follow up with [facility]    . (appointment scheduled on 02/28/2014 at 1000 am)    Contact information:   [place] [id number] [phone]       The results of significant diagnostics from this hospitalization (including imaging, microbiology, ancillary and laboratory) are listed below for reference.    Significant Diagnostic Studies: Ct Abdomen Pelvis W Contrast  02/23/2014   CLINICAL DATA:  Initial evaluation left upper quadrant pain, left flank pain, similar to prior episodes of pancreatitis, personal history of substance abuse, pancreatitis, and hypertension  EXAM: CT ABDOMEN AND PELVIS WITH CONTRAST  TECHNIQUE: Multidetector CT imaging of the abdomen and pelvis was performed using the standard protocol following bolus administration of intravenous contrast.  CONTRAST:  176mL OMNIPAQUE IOHEXOL 300 MG/ML  SOLN  COMPARISON:  None.  FINDINGS: Visualized portions of the lung bases clear. Visualized portions of the are normal.  Mild  diffuse fatty infiltration of the liver. Gallbladder is normal. Spleen is normal.  Adrenal glands are normal. Kidneys are normal. Mild calcification of the abdominal aorta.  Stomach, small bowel, and large bowel are normal.  Bladder and reproductive organs are normal. No acute musculoskeletal findings. No ascites.  There is mild inflammatory change around the distal body and tail the pancreas. The tail is prominent with a diameter of 3 cm. There are numerous tiny cystic areas in the tail of the pancreas center new from prior study. In the body of the pancreas there is 16 mm cystic area increased about 13 mm previously. A cystic lesion in the uncinate process measures 28 x 19 mm, increased from 13 x 23 mm previously.  IMPRESSION: Findings most consistent with acute pancreatitis. There are numerous tiny cystic lesions in the tail  the pancreas that were not present previously. These could represent small pseudocysts. There is also interval enlargement of 2 more discrete pancreatic cystic lesions as described above. These may represent enlarged pseudocysts as well. The possibility of papillary intraductal mucinous tumor is not excluded and pancreatic MRI would be suggested   Electronically Signed   By: Skipper Cliche M.D.   On: 02/23/2014 14:25   Dg Abd Acute W/chest  02/07/2014   CLINICAL DATA:  Epigastric and LEFT abdominal pain  EXAM: ACUTE ABDOMEN SERIES (ABDOMEN 2 VIEW & CHEST 1 VIEW)  COMPARISON:  01/30/2014  FINDINGS: Normal heart size, mediastinal contours, and pulmonary vascularity.  Lungs clear.  No pleural effusion or pneumothorax.  Nonobstructive bowel gas pattern.  No bowel dilatation or bowel wall thickening or free intraperitoneal air.  Bones unremarkable.  No urinary tract calcification.  IMPRESSION: Normal exam.   Electronically Signed   By: Lavonia Dana M.D.   On: 02/07/2014 13:24   Dg Abd Acute W/chest  01/30/2014   CLINICAL DATA:  Left lower quadrant and epigastric pain. Pancreatitis. Nausea and vomiting.  EXAM: ACUTE ABDOMEN SERIES (ABDOMEN 2 VIEW & CHEST 1 VIEW)  COMPARISON:  CT abdomen and pelvis 01/09/2014. Chest and two views abdomen 12/09/2013.  FINDINGS: Single view of the chest demonstrates clear lungs and normal heart size. No pneumothorax or pleural effusion.  Two views of the abdomen show no free intraperitoneal air. The bowel gas pattern is unremarkable. No abnormal abdominal calcification or focal bony abnormality is identified.  IMPRESSION: Negative examination.   Electronically Signed   By: Inge Rise M.D.   On: 01/30/2014 09:26    Labs: Basic Metabolic Panel:  Recent Labs Lab 02/23/14 0900 02/23/14 1227 02/24/14 0036 02/25/14 0708  NA 138  --  137 142  K 3.6*  --  3.6* 3.7  CL 99  --  104 108  CO2 24  --  20 23  GLUCOSE 95  --  87 150*  BUN 10  --  6 3*  CREATININE 0.55  --   0.49* 0.57  CALCIUM 9.1  --  8.3* 8.4  MG  --  1.6  --   --    Liver Function Tests:  Recent Labs Lab 02/23/14 0900 02/24/14 0036  AST 62* 38*  ALT 46 34  ALKPHOS 124* 108  BILITOT 0.3 0.5  PROT 8.1 6.8  ALBUMIN 3.8 3.0*    Recent Labs Lab 02/23/14 0900  LIPASE 161*   CBC:  Recent Labs Lab 02/23/14 0900 02/24/14 0036  WBC 9.4 9.0  NEUTROABS 6.0  --   HGB 11.7* 10.3*  HCT 33.9* 30.9*  MCV 92.6 93.6  PLT 236 194   Cardiac Enzymes:  Recent Labs Lab 02/23/14 0900 02/23/14 1227 02/23/14 1740 02/24/14 0035  TROPONINI <0.30 <0.30 <0.30 <0.30     Signed:  Melton Alar, PA-C  [facility] 02/25/2014, 3:38 PM

## 2014-02-25 NOTE — Progress Notes (Signed)
CARE MANAGEMENT NOTE 02/25/2014  Patient:  [name], [name]   Account Number:  [id number]  Date Initiated:  02/25/2014  Documentation initiated by:  [facility]  Subjective/Objective Assessment:   Acute on chronic pancreatitis     Action/Plan:   ETOH   Anticipated DC Date:  02/25/2014   Anticipated DC Plan:  [place]  CM consult  Other      Choice offered to / List presented to:             Status of service:  Completed, signed off Medicare Important Message given?   (If response is "NO", the following Medicare IM given date fields will be blank) Date Medicare IM given:   Medicare IM given by:   Date Additional Medicare IM given:   Additional Medicare IM given by:    Discharge Disposition:  HOME/SELF CARE  Per UR Regulation:    If discussed at Long Length of Stay Meetings, dates discussed:    Comments:  02/25/2014 1230 NCM spoke to pt and states he has to pick up his paperwork from Tax office stating he does not work for his appt with [facility] and [facility] on 02/28/2014 at 1000. NCM explained the importance of providing necessary paperwork so he can continue to follow up with clinic. Pt states he wants to apply for SS disability. Provided pt with brochure for [facility]. Explained he can pick up meds from clinic at discounted rate. Jonnie Finner RN CCM  Case Mgmt phone [phone]

## 2014-02-25 NOTE — Discharge Summary (Signed)
Addendum  Patient seen and examined, chart and data base reviewed.  I agree with the above assessment and plan.  For full details please see Mrs. Imogene Burn PA note.  Acute peritonitis, recovered quickly, patient requested to be discharged on full liquids.  Followup with CT abdomen/pelvis in one month, rule out enlarging pseudocyst.   Birdie Hopes, MD [facility] Pager: [phone] 02/25/2014, 6:31 PM

## 2014-02-25 NOTE — Discharge Instructions (Signed)
[facility]  Do not use cocaine.  It will hurt your pancreas and it does not mix with the blood pressure medication you are taking.

## 2014-02-25 NOTE — Progress Notes (Addendum)
[name] discharged Home per MD order.  Discharge instructions reviewed and discussed with the patient, all questions and concerns answered. Copy of instructions, care notes for pancreatitis, new medications, ETOH & drug assistance and scripts given to patient.    Medication List         albuterol 108 (90 BASE) MCG/ACT inhaler  Commonly known as:  PROVENTIL HFA;VENTOLIN HFA  Inhale 2 puffs into the lungs every 6 (six) hours as needed for wheezing or shortness of breath.     HYDROcodone-acetaminophen 5-325 MG per tablet  Commonly known as:  NORCO/VICODIN  Take 1-2 tablets by mouth every 6 (six) hours as needed for moderate pain.     lipase/protease/amylase 12000 UNITS Cpep capsule  Commonly known as:  CREON  Take 2 capsules (24,000 Units total) by mouth 3 (three) times daily with meals.     loperamide 2 MG capsule  Commonly known as:  IMODIUM  Take 1-2 capsules (2-4 mg total) by mouth as needed for diarrhea or loose stools.     loratadine 10 MG tablet  Commonly known as:  CLARITIN  Take 1 tablet (10 mg total) by mouth daily.     metoprolol tartrate 25 MG tablet  Commonly known as:  LOPRESSOR  Take 1 tablet (25 mg total) by mouth 2 (two) times daily.     multivitamin capsule  Take 1 capsule by mouth daily.     pantoprazole 40 MG tablet  Commonly known as:  PROTONIX  Take 1 tablet (40 mg total) by mouth 2 (two) times daily.     promethazine 25 MG tablet  Commonly known as:  PHENERGAN  Take 1 tablet (25 mg total) by mouth every 6 (six) hours as needed for nausea or vomiting.     QUEtiapine 100 MG tablet  Commonly known as:  SEROQUEL  Take 1 tablet (100 mg total) by mouth at bedtime. For mood control     sertraline 25 MG tablet  Commonly known as:  ZOLOFT  Take 75 mg by mouth daily.     thiamine 100 MG tablet  Take 100 mg by mouth daily.        Patients skin is clean, dry and intact, no evidence of skin break down. IV site discontinued and catheter remains  intact. Site without signs and symptoms of complications. Dressing and pressure applied.  Pt. Home medications was picked up for main pharmacy & returned to patients  Patient ambulated to car with RN,  no distress noted upon discharge.  [name], [name] C 02/25/2014 3:59 PM

## 2014-02-27 ENCOUNTER — Emergency Department (HOSPITAL_COMMUNITY)
Admission: EM | Admit: 2014-02-27 | Discharge: 2014-02-28 | Disposition: A | Discharge status: Home / Self Care | Payer: Self-pay | Attending: Emergency Medicine | Admitting: Emergency Medicine

## 2014-02-27 ENCOUNTER — Emergency Department (HOSPITAL_COMMUNITY): Payer: Self-pay

## 2014-02-27 ENCOUNTER — Encounter (HOSPITAL_COMMUNITY): Payer: Self-pay | Admitting: Emergency Medicine

## 2014-02-27 DIAGNOSIS — R0789 Other chest pain: Secondary | ICD-10-CM | POA: Insufficient documentation

## 2014-02-27 DIAGNOSIS — Z79899 Other long term (current) drug therapy: Secondary | ICD-10-CM | POA: Insufficient documentation

## 2014-02-27 DIAGNOSIS — R011 Cardiac murmur, unspecified: Secondary | ICD-10-CM | POA: Insufficient documentation

## 2014-02-27 DIAGNOSIS — R0602 Shortness of breath: Secondary | ICD-10-CM | POA: Insufficient documentation

## 2014-02-27 DIAGNOSIS — Z720 Tobacco use: Secondary | ICD-10-CM | POA: Insufficient documentation

## 2014-02-27 DIAGNOSIS — Z8639 Personal history of other endocrine, nutritional and metabolic disease: Secondary | ICD-10-CM | POA: Insufficient documentation

## 2014-02-27 DIAGNOSIS — M791 Myalgia: Secondary | ICD-10-CM | POA: Insufficient documentation

## 2014-02-27 DIAGNOSIS — F329 Major depressive disorder, single episode, unspecified: Secondary | ICD-10-CM | POA: Insufficient documentation

## 2014-02-27 DIAGNOSIS — G43909 Migraine, unspecified, not intractable, without status migrainosus: Secondary | ICD-10-CM | POA: Insufficient documentation

## 2014-02-27 DIAGNOSIS — J45901 Unspecified asthma with (acute) exacerbation: Secondary | ICD-10-CM | POA: Insufficient documentation

## 2014-02-27 DIAGNOSIS — F419 Anxiety disorder, unspecified: Secondary | ICD-10-CM | POA: Insufficient documentation

## 2014-02-27 DIAGNOSIS — I10 Essential (primary) hypertension: Secondary | ICD-10-CM | POA: Insufficient documentation

## 2014-02-27 DIAGNOSIS — R079 Chest pain, unspecified: Secondary | ICD-10-CM

## 2014-02-27 DIAGNOSIS — M1389 Other specified arthritis, multiple sites: Secondary | ICD-10-CM | POA: Insufficient documentation

## 2014-02-27 DIAGNOSIS — R630 Anorexia: Secondary | ICD-10-CM | POA: Insufficient documentation

## 2014-02-27 DIAGNOSIS — K859 Acute pancreatitis, unspecified: Secondary | ICD-10-CM | POA: Insufficient documentation

## 2014-02-27 DIAGNOSIS — R51 Headache: Secondary | ICD-10-CM

## 2014-02-27 DIAGNOSIS — D649 Anemia, unspecified: Secondary | ICD-10-CM | POA: Insufficient documentation

## 2014-02-27 DIAGNOSIS — R519 Headache, unspecified: Secondary | ICD-10-CM

## 2014-02-27 DIAGNOSIS — K219 Gastro-esophageal reflux disease without esophagitis: Secondary | ICD-10-CM | POA: Insufficient documentation

## 2014-02-27 DIAGNOSIS — R1013 Epigastric pain: Secondary | ICD-10-CM | POA: Insufficient documentation

## 2014-02-27 LAB — CBC
HCT: 32.7 % — ABNORMAL LOW (ref 39.0–52.0)
HEMOGLOBIN: 11.4 g/dL — AB (ref 13.0–17.0)
MCH: 32 pg (ref 26.0–34.0)
MCHC: 34.9 g/dL (ref 30.0–36.0)
MCV: 91.9 fL (ref 78.0–100.0)
Platelets: 273 10*3/uL (ref 150–400)
RBC: 3.56 MIL/uL — AB (ref 4.22–5.81)
RDW: 16 % — ABNORMAL HIGH (ref 11.5–15.5)
WBC: 7.6 10*3/uL (ref 4.0–10.5)

## 2014-02-27 LAB — TROPONIN I: Troponin I: <0.3 ng/mL (ref <0.30)

## 2014-02-27 LAB — BASIC METABOLIC PANEL
Anion gap: 16 — ABNORMAL HIGH (ref 5–15)
BUN: 6 mg/dL (ref 6–23)
CHLORIDE: 98 meq/L (ref 96–112)
CO2: 25 meq/L (ref 19–32)
Calcium: 9.7 mg/dL (ref 8.4–10.5)
Creatinine, Ser: 0.7 mg/dL (ref 0.50–1.35)
GFR calc Af Amer: >90 mL/min (ref >90)
GFR calc non Af Amer: >90 mL/min (ref >90)
Glucose, Bld: 94 mg/dL (ref 70–99)
Potassium: 3.5 mEq/L — ABNORMAL LOW (ref 3.7–5.3)
SODIUM: 139 meq/L (ref 137–147)

## 2014-02-27 MED ORDER — ONDANSETRON HCL 4 MG/2ML IJ SOLN
4.0000 mg | Freq: Once | INTRAMUSCULAR | Status: AC
  Administered 2014-02-28: 4 mg via INTRAVENOUS
  Filled 2014-02-27: qty 2

## 2014-02-27 MED ORDER — MORPHINE SULFATE 4 MG/ML IJ SOLN
4.0000 mg | Freq: Once | INTRAMUSCULAR | Status: AC
  Administered 2014-02-28: 4 mg via INTRAVENOUS
  Filled 2014-02-27: qty 1

## 2014-02-27 MED ORDER — SODIUM CHLORIDE 0.9 % IV BOLUS (SEPSIS)
1000.0000 mL | Freq: Once | INTRAVENOUS | Status: AC
  Administered 2014-02-28: 1000 mL via INTRAVENOUS

## 2014-02-27 NOTE — ED Provider Notes (Signed)
CSN: [id number]     Arrival date & time 02/27/14  2224 History   First MD Initiated Contact with Patient 02/27/14 2325     Chief Complaint  Patient presents with  . Chest Pain  . Headache  . Shortness of Breath  . Dizziness     (Consider location/radiation/quality/duration/timing/severity/associated sxs/prior Treatment) HPI Comments: Patient with history of alcoholic pancreatitis presenting with chest pain and headache the onset around 4:30 this afternoon. Chest pain is left-sided does not radiate. It lasts for about 5 seconds at a time and comes and goes. This has happened about 7 times since 4:30 PM. He has a history of Wolff-Parkinson-White but no previous cardiac history. He is not had chest pain medicine in the past. This is different than his pancreatitis pain. He denies any shortness of breath, nausea, vomiting or fever. Around the same time he developed diffuse frontal headache that is gradual in onset. He said migraines in the past and is similar. Denies any fever. Denies any photophobia and phonophobia. He is, numbness or tingling. States he was discharged from hospital 3 days ago for his pancreatitis.  The history is provided by the patient.    Past Medical History  Diagnosis Date  . Hypertension   . Asthma   . Pancreatitis   . Cocaine abuse   . Depression   . H/O suicide attempt   . Heart murmur     "when he was little" (03/06/2013)  . Shortness of breath     "can happen at anytime" (03/06/2013)  . Anemia   . H/O hiatal hernia   . GERD (gastroesophageal reflux disease)   . Anxiety   . WPW (Wolff-Parkinson-White syndrome)     [name] 03/06/2013  . High cholesterol   . Migraine     "monthly" (01/30/2014)  . Arthritis     "knees; arms; elbows" (01/30/2014)   Past Surgical History  Procedure Laterality Date  . Facial fracture surgery Left 1990's    "result of trauma"   . Eye surgery Left 1990's    "result of trauma"    Family History  Problem Relation Age of  Onset  . Hypertension Other   . Coronary artery disease Other    History  Substance Use Topics  . Smoking status: Current Every Day Smoker -- 1.00 packs/day for 30 years    Types: Cigarettes  . Smokeless tobacco: Current User    Types: Chew  . Alcohol Use: 28.2 oz/week    47 Cans of beer per week     Comment: 01/30/2014 "I drink 2, 40oz  beers per day"    Review of Systems  Constitutional: Positive for activity change and appetite change. Negative for fever and fatigue.  HENT: Negative for congestion.   Eyes: Negative for visual disturbance.  Respiratory: Positive for chest tightness and shortness of breath. Negative for cough.   Cardiovascular: Negative for chest pain.  Gastrointestinal: Negative for nausea, vomiting and abdominal pain.  Endocrine: Negative for polyuria.  Genitourinary: Negative for dysuria and hematuria.  Musculoskeletal: Positive for arthralgias and myalgias.  Skin: Negative for rash.  Neurological: Positive for headaches. Negative for dizziness and weakness.  A complete 10 system review of systems was obtained and all systems are negative except as noted in the HPI and PMH.      Allergies  Shellfish-derived products and Trazodone and nefazodone  Home Medications   Prior to Admission medications   Medication Sig Start Date End Date Taking? Authorizing Provider  albuterol (PROVENTIL HFA;VENTOLIN  HFA) 108 (90 BASE) MCG/ACT inhaler Inhale 2 puffs into the lungs every 6 (six) hours as needed for wheezing or shortness of breath.   Yes Historical Provider, MD  HYDROcodone-acetaminophen (NORCO/VICODIN) 5-325 MG per tablet Take 1-2 tablets by mouth every 6 (six) hours as needed for moderate pain. 02/25/14  Yes [name], PA-C  lipase/protease/amylase (CREON) 12000 UNITS CPEP capsule Take 2 capsules (24,000 Units total) by mouth 3 (three) times daily with meals. 02/06/14  Yes [name], MD  loperamide (IMODIUM) 2 MG capsule Take 1-2 capsules (2-4 mg  total) by mouth as needed for diarrhea or loose stools. 01/23/14  Yes Marianne L York, PA-C  loratadine (CLARITIN) 10 MG tablet Take 1 tablet (10 mg total) by mouth daily. 02/25/14  Yes Marianne L York, PA-C  metoprolol tartrate (LOPRESSOR) 25 MG tablet Take 1 tablet (25 mg total) by mouth 2 (two) times daily. 02/06/14  Yes [name], MD  Multiple Vitamin (MULTIVITAMIN) capsule Take 1 capsule by mouth daily. 02/06/14  Yes [name], MD  pantoprazole (PROTONIX) 40 MG tablet Take 1 tablet (40 mg total) by mouth 2 (two) times daily. 11/28/13  Yes [name], MD  promethazine (PHENERGAN) 25 MG tablet Take 1 tablet (25 mg total) by mouth every 6 (six) hours as needed for nausea or vomiting. 02/25/14  Yes Marianne L York, PA-C  QUEtiapine (SEROQUEL) 100 MG tablet Take 1 tablet (100 mg total) by mouth at bedtime. For mood control 01/15/14  Yes Shanker Kristeen Mans, MD  sertraline (ZOLOFT) 25 MG tablet Take 75 mg by mouth daily.   Yes Historical Provider, MD  thiamine 100 MG tablet Take 100 mg by mouth daily.    Yes Historical Provider, MD   BP 147/94  Pulse 92  Temp(Src) 98.3 F (36.8 C) (Oral)  Resp 16  Ht 5\' 9"  (1.753 m)  Wt 131 lb 3.2 oz (59.512 kg)  BMI 19.37 kg/m2  SpO2 100% Physical Exam  Nursing note and vitals reviewed. Constitutional: He is oriented to person, place, and time. He appears well-developed and well-nourished. No distress.  HENT:  Head: Normocephalic and atraumatic.  Mouth/Throat: Oropharynx is clear and moist. No oropharyngeal exudate.  No temporal artery tenderness  Eyes: Conjunctivae and EOM are normal. Pupils are equal, round, and reactive to light.  Neck: Normal range of motion. Neck supple.  No meningismus.  Cardiovascular: Normal rate, regular rhythm, normal heart sounds and intact distal pulses.   No murmur heard. Pulmonary/Chest: Effort normal and breath sounds normal. No respiratory distress. He exhibits no tenderness.  No chest wall  tenderness  Abdominal: Soft. There is tenderness. There is no rebound and no guarding.  TTP epigastrium  Musculoskeletal: Normal range of motion. He exhibits no edema and no tenderness.  Neurological: He is alert and oriented to person, place, and time. No cranial nerve deficit. He exhibits normal muscle tone. Coordination normal.  No ataxia on finger to nose bilaterally. No pronator drift. 5/5 strength throughout. CN 2-12 intact. Negative Romberg. Equal grip strength. Sensation intact. Gait is normal.   Skin: Skin is warm.  Psychiatric: He has a normal mood and affect. His behavior is normal.    ED Course  Procedures (including critical care time) Labs Review Labs Reviewed  CBC - Abnormal; Notable for the following:    RBC 3.56 (*)    Hemoglobin 11.4 (*)    HCT 32.7 (*)    RDW 16.0 (*)    All other components within normal  limits  BASIC METABOLIC PANEL - Abnormal; Notable for the following:    Potassium 3.5 (*)    Anion gap 16 (*)    All other components within normal limits  HEPATIC FUNCTION PANEL - Abnormal; Notable for the following:    Total Protein 8.8 (*)    AST 52 (*)    Alkaline Phosphatase 146 (*)    All other components within normal limits  TROPONIN I  URINALYSIS, ROUTINE W REFLEX MICROSCOPIC  LIPASE, BLOOD  TROPONIN I    Imaging Review Ct Head Wo Contrast  02/28/2014   CLINICAL DATA:  Headache and dizziness.  EXAM: CT HEAD WITHOUT CONTRAST  TECHNIQUE: Contiguous axial images were obtained from the base of the skull through the vertex without intravenous contrast.  COMPARISON:  09/13/2013  FINDINGS: Diffuse cerebral atrophy. No ventricular dilatation. No significant white matter disease. Old fracture deformity and postoperative changes involving the left medial and inferior orbital walls. Old nasal bone fractures. No mass effect or midline shift. No abnormal extra-axial fluid collections. Gray-white matter junctions are distinct. Basal cisterns are not effaced. No  evidence of acute intracranial hemorrhage. No depressed skull fractures. Visualized paranasal sinuses and mastoid air cells are not opacified.  IMPRESSION: No acute intracranial abnormalities.  Chronic atrophy.   Electronically Signed   By: Lucienne Capers M.D.   On: 02/28/2014 00:14   Dg Abd Acute W/chest  02/28/2014   CLINICAL DATA:  43 year old male with chest abdominal pain headache and vomiting. Initial encounter. Current history of pancreatitis.  EXAM: ACUTE ABDOMEN SERIES (ABDOMEN 2 VIEW & CHEST 1 VIEW)  COMPARISON:  CT Abdomen and Pelvis 02/23/2014 and earlier.  FINDINGS: No pleural effusion identified. Stable lung volumes. No pneumothorax, pulmonary edema or acute opacity. Normal cardiac size and mediastinal contours. Visualized tracheal air column is within normal limits.  No pneumoperitoneum. Non obstructed bowel gas pattern. Stable visualized osseous structures.  IMPRESSION: 1. Non obstructed bowel gas pattern, no free air. See also a recent CT Abdomen and Pelvis 02/23/2014. 2.  No acute cardiopulmonary abnormality.   Electronically Signed   By: Lars Pinks M.D.   On: 02/28/2014 00:40     EKG Interpretation   Date/Time:  Friday February 28 2014 02:54:56 EDT Ventricular Rate:  92 PR Interval:  108 QRS Duration: 77 QT Interval:  327 QTC Calculation: 404 R Axis:   69 Text Interpretation:  Sinus rhythm Short PR interval Biatrial enlargement  Abnormal T, consider ischemia, diffuse leads No significant change was  found Confirmed by Wyvonnia Dusky  MD, Triston Skare ([id number]) on 02/28/2014 2:59:54 AM      MDM   Final diagnoses:  Headache  Chest pain  Atypical chest pain   Intermittent L sided chest pain since 1630.  Lasting for a few seconds at a time.  Atypical for ACS.  EKG with unchanged T wave inversions.  Gradual onset headache typical of previous migraines with normal neuro exam.  Orthostatics positive by heart rate.  IVF given. CT head negative.   Troponin negative. LFTs at baseline.   Lipase improved to 34.  Abdomen soft and nontender. Symptoms improved in the ED.  Headache has resolved, possibly dehydration related given orthostasis and recent clear liquid diet. Chest pain atypical for ACS.  Troponin negative x 2.  EKG unchanged.  Tolerating PO.  Appears stable for outpatient followup.  Return precautions discussed.  BP 147/94  Pulse 92  Temp(Src) 98.3 F (36.8 C) (Oral)  Resp 16  Ht 5\' 9"  (1.753 m)  Wt 131  lb 3.2 oz (59.512 kg)  BMI 19.37 kg/m2  SpO2 100%   Ezequiel Essex, MD 02/28/14 [phone]

## 2014-02-27 NOTE — ED Notes (Signed)
Pt c/o CP/SOB/HA/dizziness that started at 74 today while at work.

## 2014-02-28 ENCOUNTER — Ambulatory Visit: Payer: Self-pay

## 2014-02-28 ENCOUNTER — Encounter (HOSPITAL_COMMUNITY): Payer: Self-pay | Admitting: Emergency Medicine

## 2014-02-28 ENCOUNTER — Emergency Department (HOSPITAL_COMMUNITY): Payer: Self-pay

## 2014-02-28 DIAGNOSIS — K219 Gastro-esophageal reflux disease without esophagitis: Secondary | ICD-10-CM | POA: Insufficient documentation

## 2014-02-28 DIAGNOSIS — J45909 Unspecified asthma, uncomplicated: Secondary | ICD-10-CM | POA: Insufficient documentation

## 2014-02-28 DIAGNOSIS — I10 Essential (primary) hypertension: Secondary | ICD-10-CM | POA: Insufficient documentation

## 2014-02-28 DIAGNOSIS — F419 Anxiety disorder, unspecified: Secondary | ICD-10-CM | POA: Insufficient documentation

## 2014-02-28 DIAGNOSIS — R1013 Epigastric pain: Secondary | ICD-10-CM | POA: Insufficient documentation

## 2014-02-28 DIAGNOSIS — R1033 Periumbilical pain: Secondary | ICD-10-CM | POA: Insufficient documentation

## 2014-02-28 DIAGNOSIS — G43909 Migraine, unspecified, not intractable, without status migrainosus: Secondary | ICD-10-CM | POA: Insufficient documentation

## 2014-02-28 DIAGNOSIS — R011 Cardiac murmur, unspecified: Secondary | ICD-10-CM | POA: Insufficient documentation

## 2014-02-28 DIAGNOSIS — Z862 Personal history of diseases of the blood and blood-forming organs and certain disorders involving the immune mechanism: Secondary | ICD-10-CM | POA: Insufficient documentation

## 2014-02-28 DIAGNOSIS — Z79899 Other long term (current) drug therapy: Secondary | ICD-10-CM | POA: Insufficient documentation

## 2014-02-28 DIAGNOSIS — Z720 Tobacco use: Secondary | ICD-10-CM | POA: Insufficient documentation

## 2014-02-28 DIAGNOSIS — Z8639 Personal history of other endocrine, nutritional and metabolic disease: Secondary | ICD-10-CM | POA: Insufficient documentation

## 2014-02-28 DIAGNOSIS — F329 Major depressive disorder, single episode, unspecified: Secondary | ICD-10-CM | POA: Insufficient documentation

## 2014-02-28 DIAGNOSIS — M1990 Unspecified osteoarthritis, unspecified site: Secondary | ICD-10-CM | POA: Insufficient documentation

## 2014-02-28 DIAGNOSIS — R112 Nausea with vomiting, unspecified: Secondary | ICD-10-CM | POA: Insufficient documentation

## 2014-02-28 LAB — I-STAT TROPONIN, ED: TROPONIN I, POC: 0 ng/mL (ref 0.00–0.08)

## 2014-02-28 LAB — CBC
HEMATOCRIT: 33.2 % — AB (ref 39.0–52.0)
Hemoglobin: 11.3 g/dL — ABNORMAL LOW (ref 13.0–17.0)
MCH: 31.7 pg (ref 26.0–34.0)
MCHC: 34 g/dL (ref 30.0–36.0)
MCV: 93 fL (ref 78.0–100.0)
Platelets: 257 10*3/uL (ref 150–400)
RBC: 3.57 MIL/uL — ABNORMAL LOW (ref 4.22–5.81)
RDW: 16 % — AB (ref 11.5–15.5)
WBC: 7.6 10*3/uL (ref 4.0–10.5)

## 2014-02-28 LAB — URINALYSIS, ROUTINE W REFLEX MICROSCOPIC
Bilirubin Urine: NEGATIVE
Glucose, UA: NEGATIVE mg/dL
Hgb urine dipstick: NEGATIVE
Ketones, ur: NEGATIVE mg/dL
LEUKOCYTES UA: NEGATIVE
NITRITE: NEGATIVE
PH: 5.5 (ref 5.0–8.0)
Protein, ur: NEGATIVE mg/dL
SPECIFIC GRAVITY, URINE: 1.018 (ref 1.005–1.030)
Urobilinogen, UA: 0.2 mg/dL (ref 0.0–1.0)

## 2014-02-28 LAB — HEPATIC FUNCTION PANEL
ALT: 48 U/L (ref 0–53)
AST: 52 U/L — AB (ref 0–37)
Albumin: 4.3 g/dL (ref 3.5–5.2)
Alkaline Phosphatase: 146 U/L — ABNORMAL HIGH (ref 39–117)
Bilirubin, Direct: <0.2 mg/dL (ref 0.0–0.3)
Total Bilirubin: 0.3 mg/dL (ref 0.3–1.2)
Total Protein: 8.8 g/dL — ABNORMAL HIGH (ref 6.0–8.3)

## 2014-02-28 LAB — LIPASE, BLOOD: Lipase: 34 U/L (ref 11–59)

## 2014-02-28 LAB — BASIC METABOLIC PANEL
ANION GAP: 16 — AB (ref 5–15)
BUN: 5 mg/dL — ABNORMAL LOW (ref 6–23)
CHLORIDE: 103 meq/L (ref 96–112)
CO2: 21 mEq/L (ref 19–32)
Calcium: 9.4 mg/dL (ref 8.4–10.5)
Creatinine, Ser: 0.63 mg/dL (ref 0.50–1.35)
GFR calc non Af Amer: >90 mL/min (ref >90)
Glucose, Bld: 98 mg/dL (ref 70–99)
POTASSIUM: 3.9 meq/L (ref 3.7–5.3)
Sodium: 140 mEq/L (ref 137–147)

## 2014-02-28 LAB — TROPONIN I

## 2014-02-28 MED ORDER — OXYCODONE-ACETAMINOPHEN 5-325 MG PO TABS
2.0000 | ORAL_TABLET | Freq: Once | ORAL | Status: AC
  Administered 2014-02-28: 2 via ORAL
  Filled 2014-02-28: qty 2

## 2014-02-28 NOTE — ED Notes (Addendum)
Presents with left sided chest pain with radiation into back associated with SOB, nausea, vomiting. Pain is constant and sharp. Pt was seen for same last night.  Also c/o headache. Alert and oriented.

## 2014-02-28 NOTE — Discharge Instructions (Signed)
Chest Pain (Nonspecific) °There is no evidence of heart attack or blood clot in the lung. Follow up with your doctor. Return to the ED if you develop new or worsening symptoms. °It is often hard to give a specific diagnosis for the cause of chest pain. There is always a chance that your pain could be related to something serious, such as a heart attack or a blood clot in the lungs. You need to follow up with your health care provider for further evaluation. °CAUSES  °· Heartburn. °· Pneumonia or bronchitis. °· Anxiety or stress. °· Inflammation around your heart (pericarditis) or lung (pleuritis or pleurisy). °· A blood clot in the lung. °· A collapsed lung (pneumothorax). It can develop suddenly on its own (spontaneous pneumothorax) or from trauma to the chest. °· Shingles infection (herpes zoster virus). °The chest wall is composed of bones, muscles, and cartilage. Any of these can be the source of the pain. °· The bones can be bruised by injury. °· The muscles or cartilage can be strained by coughing or overwork. °· The cartilage can be affected by inflammation and become sore (costochondritis). °DIAGNOSIS  °Lab tests or other studies may be needed to find the cause of your pain. Your health care provider may have you take a test called an ambulatory electrocardiogram (ECG). An ECG records your heartbeat patterns over a 24-hour period. You may also have other tests, such as: °· Transthoracic echocardiogram (TTE). During echocardiography, sound waves are used to evaluate how blood flows through your heart. °· Transesophageal echocardiogram (TEE). °· Cardiac monitoring. This allows your health care provider to monitor your heart rate and rhythm in real time. °· Holter monitor. This is a portable device that records your heartbeat and can help diagnose heart arrhythmias. It allows your health care provider to track your heart activity for several days, if needed. °· Stress tests by exercise or by giving medicine  that makes the heart beat faster. °TREATMENT  °· Treatment depends on what may be causing your chest pain. Treatment may include: °¨ Acid blockers for heartburn. °¨ Anti-inflammatory medicine. °¨ Pain medicine for inflammatory conditions. °¨ Antibiotics if an infection is present. °· You may be advised to change lifestyle habits. This includes stopping smoking and avoiding alcohol, caffeine, and chocolate. °· You may be advised to keep your head raised (elevated) when sleeping. This reduces the chance of acid going backward from your stomach into your esophagus. °Most of the time, nonspecific chest pain will improve within 2-3 days with rest and mild pain medicine.  °HOME CARE INSTRUCTIONS  °· If antibiotics were prescribed, take them as directed. Finish them even if you start to feel better. °· For the next few days, avoid physical activities that bring on chest pain. Continue physical activities as directed. °· Do not use any tobacco products, including cigarettes, chewing tobacco, or electronic cigarettes. °· Avoid drinking alcohol. °· Only take medicine as directed by your health care provider. °· Follow your health care provider's suggestions for further testing if your chest pain does not go away. °· Keep any follow-up appointments you made. If you do not go to an appointment, you could develop lasting (chronic) problems with pain. If there is any problem keeping an appointment, call to reschedule. °SEEK MEDICAL CARE IF:  °· Your chest pain does not go away, even after treatment. °· You have a rash with blisters on your chest. °· You have a fever. °SEEK IMMEDIATE MEDICAL CARE IF:  °· You have increased   chest pain or pain that spreads to your arm, neck, jaw, back, or abdomen. °· You have shortness of breath. °· You have an increasing cough, or you cough up blood. °· You have severe back or abdominal pain. °· You feel nauseous or vomit. °· You have severe weakness. °· You faint. °· You have chills. °This is an  emergency. Do not wait to see if the pain will go away. Get medical help at once. Call your local emergency services (911 in [place] Do not drive yourself to the hospital. °MAKE SURE YOU:  °· Understand these instructions. °· Will watch your condition. °· Will get help right away if you are not doing well or get worse. °Document Released: 02/02/2005 Document Revised: 04/30/2013 Document Reviewed: 11/29/2007 °[employer]® Patient Information ©2015 [employer]. This information is not intended to replace advice given to you by your health care provider. Make sure you discuss any questions you have with your health care provider. ° °

## 2014-02-28 NOTE — ED Notes (Signed)
While ambulating pt, did not experience any complaints. Pt was given a ginger ale to drink.

## 2014-03-01 ENCOUNTER — Emergency Department (HOSPITAL_COMMUNITY)
Admission: EM | Admit: 2014-03-01 | Discharge: 2014-03-01 | Disposition: A | Discharge status: Home / Self Care | Payer: Self-pay | Attending: Emergency Medicine | Admitting: Emergency Medicine

## 2014-03-01 DIAGNOSIS — R079 Chest pain, unspecified: Secondary | ICD-10-CM

## 2014-03-01 DIAGNOSIS — R1033 Periumbilical pain: Secondary | ICD-10-CM

## 2014-03-01 LAB — LIPASE, BLOOD: LIPASE: 175 U/L — AB (ref 11–59)

## 2014-03-01 MED ORDER — HYDROCODONE-ACETAMINOPHEN 5-325 MG PO TABS
1.0000 | ORAL_TABLET | Freq: Once | ORAL | Status: AC
  Administered 2014-03-01: 1 via ORAL
  Filled 2014-03-01: qty 1

## 2014-03-01 MED ORDER — ONDANSETRON HCL 4 MG/2ML IJ SOLN
4.0000 mg | Freq: Once | INTRAMUSCULAR | Status: AC
  Administered 2014-03-01: 4 mg via INTRAVENOUS
  Filled 2014-03-01: qty 2

## 2014-03-01 MED ORDER — SODIUM CHLORIDE 0.9 % IV SOLN
Freq: Once | INTRAVENOUS | Status: AC
  Administered 2014-03-01: 03:00:00 via INTRAVENOUS

## 2014-03-01 MED ORDER — HYDROMORPHONE HCL 1 MG/ML IJ SOLN
1.0000 mg | Freq: Once | INTRAMUSCULAR | Status: AC
  Administered 2014-03-01: 1 mg via INTRAVENOUS
  Filled 2014-03-01: qty 1

## 2014-03-01 MED ORDER — SODIUM CHLORIDE 0.9 % IV SOLN
Freq: Once | INTRAVENOUS | Status: AC
  Administered 2014-03-01: 02:00:00 via INTRAVENOUS

## 2014-03-01 MED ORDER — GI COCKTAIL ~~LOC~~
30.0000 mL | Freq: Once | ORAL | Status: AC
  Administered 2014-03-01: 30 mL via ORAL
  Filled 2014-03-01: qty 30

## 2014-03-01 NOTE — Discharge Instructions (Signed)
As discussed, it is important that you follow up as soon as possible with your physician for continued management of your condition. ° °If you develop any new, or concerning changes in your condition, please return to the emergency department immediately. ° °

## 2014-03-01 NOTE — ED Provider Notes (Addendum)
CSN: [id number]     Arrival date & time 02/28/14  2247 History   First MD Initiated Contact with Patient 03/01/14 0054     Chief Complaint  Patient presents with  . Chest Pain     (Consider location/radiation/quality/duration/timing/severity/associated sxs/prior Treatment) HPI Patient presents approximately 24 hours after his most recent evaluation. Patient notes that since yesterday has had similar pain, though now more midline, as well as worsening nausea, had more episodes of emesis. Patient is a pain consultation is typical for him.  He denies any fever, confusion, disorientation. No relief with OTC medication. Patient notes that since his discharge within the past month for acute on chronic pancreatitis, he has had consistent discomfort with exacerbations. Patient has not yet had a followup visit.  Past Medical History  Diagnosis Date  . Hypertension   . Asthma   . Pancreatitis   . Cocaine abuse   . Depression   . H/O suicide attempt   . Heart murmur     "when he was little" (03/06/2013)  . Shortness of breath     "can happen at anytime" (03/06/2013)  . Anemia   . H/O hiatal hernia   . GERD (gastroesophageal reflux disease)   . Anxiety   . WPW (Wolff-Parkinson-White syndrome)     [name] 03/06/2013  . High cholesterol   . Migraine     "monthly" (01/30/2014)  . Arthritis     "knees; arms; elbows" (01/30/2014)   Past Surgical History  Procedure Laterality Date  . Facial fracture surgery Left 1990's    "result of trauma"   . Eye surgery Left 1990's    "result of trauma"    Family History  Problem Relation Age of Onset  . Hypertension Other   . Coronary artery disease Other    History  Substance Use Topics  . Smoking status: Current Every Day Smoker -- 1.00 packs/day for 30 years    Types: Cigarettes  . Smokeless tobacco: Current User    Types: Chew  . Alcohol Use: 28.2 oz/week    47 Cans of beer per week     Comment: 01/30/2014 "I drink 2, 40oz  beers per  day"    Review of Systems  Constitutional:       Per HPI, otherwise negative  HENT:       Per HPI, otherwise negative  Respiratory:       Per HPI, otherwise negative  Cardiovascular:       Per HPI, otherwise negative  Gastrointestinal: Positive for nausea, vomiting and abdominal pain. Negative for diarrhea.  Endocrine:       Negative aside from HPI  Genitourinary:       Neg aside from HPI   Musculoskeletal:       Per HPI, otherwise negative  Skin: Negative.   Neurological: Negative for syncope.      Allergies  Shellfish-derived products and Trazodone and nefazodone  Home Medications   Prior to Admission medications   Medication Sig Start Date End Date Taking? Authorizing Provider  albuterol (PROVENTIL HFA;VENTOLIN HFA) 108 (90 BASE) MCG/ACT inhaler Inhale 2 puffs into the lungs every 6 (six) hours as needed for wheezing or shortness of breath.   Yes Historical Provider, MD  HYDROcodone-acetaminophen (NORCO/VICODIN) 5-325 MG per tablet Take 1-2 tablets by mouth every 6 (six) hours as needed for moderate pain. 02/25/14  Yes [name], PA-C  lipase/protease/amylase (CREON) 12000 UNITS CPEP capsule Take 2 capsules (24,000 Units total) by mouth 3 (three) times daily  with meals. 02/06/14  Yes [name], MD  loperamide (IMODIUM) 2 MG capsule Take 1-2 capsules (2-4 mg total) by mouth as needed for diarrhea or loose stools. 01/23/14  Yes Marianne L York, PA-C  loratadine (CLARITIN) 10 MG tablet Take 1 tablet (10 mg total) by mouth daily. 02/25/14  Yes Marianne L York, PA-C  metoprolol tartrate (LOPRESSOR) 25 MG tablet Take 1 tablet (25 mg total) by mouth 2 (two) times daily. 02/06/14  Yes [name], MD  Multiple Vitamin (MULTIVITAMIN) capsule Take 1 capsule by mouth daily. 02/06/14  Yes [name], MD  pantoprazole (PROTONIX) 40 MG tablet Take 1 tablet (40 mg total) by mouth 2 (two) times daily. 11/28/13  Yes [name], MD  promethazine  (PHENERGAN) 25 MG tablet Take 1 tablet (25 mg total) by mouth every 6 (six) hours as needed for nausea or vomiting. 02/25/14  Yes Marianne L York, PA-C  QUEtiapine (SEROQUEL) 100 MG tablet Take 1 tablet (100 mg total) by mouth at bedtime. For mood control 01/15/14  Yes Shanker Kristeen Mans, MD  sertraline (ZOLOFT) 25 MG tablet Take 75 mg by mouth daily.   Yes Historical Provider, MD  thiamine 100 MG tablet Take 100 mg by mouth daily.    Yes Historical Provider, MD   BP 147/87  Pulse 116  Temp(Src) 98.5 F (36.9 C) (Oral)  Resp 19  SpO2 98% Physical Exam  Nursing note and vitals reviewed. Constitutional: He is oriented to person, place, and time. He appears well-developed. No distress.  HENT:  Head: Normocephalic and atraumatic.  Eyes: Conjunctivae and EOM are normal.  Cardiovascular: Normal rate and regular rhythm.   Pulmonary/Chest: Effort normal. No stridor. No respiratory distress.  Abdominal: He exhibits no distension. There is tenderness in the epigastric area and periumbilical area.  Musculoskeletal: He exhibits no edema.  Neurological: He is alert and oriented to person, place, and time.  Skin: Skin is warm and dry.  Psychiatric: He has a normal mood and affect.    ED Course  Procedures (including critical care time) Labs Review Labs Reviewed  CBC - Abnormal; Notable for the following:    RBC 3.57 (*)    Hemoglobin 11.3 (*)    HCT 33.2 (*)    RDW 16.0 (*)    All other components within normal limits  BASIC METABOLIC PANEL - Abnormal; Notable for the following:    BUN 5 (*)    Anion gap 16 (*)    All other components within normal limits  LIPASE, BLOOD  I-STAT TROPOININ, ED    Imaging Review Ct Head Wo Contrast  02/28/2014   CLINICAL DATA:  Headache and dizziness.  EXAM: CT HEAD WITHOUT CONTRAST  TECHNIQUE: Contiguous axial images were obtained from the base of the skull through the vertex without intravenous contrast.  COMPARISON:  09/13/2013  FINDINGS: Diffuse  cerebral atrophy. No ventricular dilatation. No significant white matter disease. Old fracture deformity and postoperative changes involving the left medial and inferior orbital walls. Old nasal bone fractures. No mass effect or midline shift. No abnormal extra-axial fluid collections. Gray-white matter junctions are distinct. Basal cisterns are not effaced. No evidence of acute intracranial hemorrhage. No depressed skull fractures. Visualized paranasal sinuses and mastoid air cells are not opacified.  IMPRESSION: No acute intracranial abnormalities.  Chronic atrophy.   Electronically Signed   By: Lucienne Capers M.D.   On: 02/28/2014 00:14   Dg Abd Acute W/chest  02/28/2014   CLINICAL DATA:  43 year old  male with chest abdominal pain headache and vomiting. Initial encounter. Current history of pancreatitis.  EXAM: ACUTE ABDOMEN SERIES (ABDOMEN 2 VIEW & CHEST 1 VIEW)  COMPARISON:  CT Abdomen and Pelvis 02/23/2014 and earlier.  FINDINGS: No pleural effusion identified. Stable lung volumes. No pneumothorax, pulmonary edema or acute opacity. Normal cardiac size and mediastinal contours. Visualized tracheal air column is within normal limits.  No pneumoperitoneum. Non obstructed bowel gas pattern. Stable visualized osseous structures.  IMPRESSION: 1. Non obstructed bowel gas pattern, no free air. See also a recent CT Abdomen and Pelvis 02/23/2014. 2.  No acute cardiopulmonary abnormality.   Electronically Signed   By: Lars Pinks M.D.   On: 02/28/2014 00:40     EKG Interpretation   Date/Time:  Friday February 28 2014 22:52:13 EDT Ventricular Rate:  111 PR Interval:  122 QRS Duration: 74 QT Interval:  314 QTC Calculation: 427 R Axis:   70 Text Interpretation:  Sinus tachycardia Biatrial enlargement Nonspecific T  wave abnormality Abnormal ECG Sinus tachycardia T wave abnormality No  significant change since last tracing Abnormal ekg Confirmed by Carmin Muskrat  MD [phone]) on 03/01/2014 12:54:06 AM      Review of the patient's the MR demonstrates multiple ER visits, admissions in the past 6 months including one ED visit yesterday.  4:03 AM And awake and alert, appears to be in no distress, states that he felt better after the analgesia.  We reviewed today's evaluation, his recent evaluations here, the need to follow up with gastroenterology in pain management. MDM   Final diagnoses:  Chest pain   abdominal pain  This patient with history of chronic pancreatitis, pseudocyst, recently evaluated, admitted here now presents with ongoing abdominal pain.  Patient has soft, non peritoneal abdomen, Patient has no true chest pain, nor ischemic changes on EKG.  Patient was resting comfortably after analgesia, fluids.  The patient is a trivial increase in his lipase, there is no evidence for distress, extremity, sepsis, peritonitis.  The patient was discharged in stable condition to follow up with primary care.    Carmin Muskrat, MD 03/01/14 0405  4:53 AM Patient requests refill of narcotics prior to discharge.  Patient last received narcotics within the past week.  I informed the patient that he cannot receive additional narcotic prescriptions from the emergency department currently, and that he should follow up with his primary care physician and/or pain management clinic  Carmin Muskrat, MD 03/01/14 [phone]

## 2014-03-08 DIAGNOSIS — S72413A Displaced unspecified condyle fracture of lower end of unspecified femur, initial encounter for closed fracture: Secondary | ICD-10-CM

## 2014-03-08 HISTORY — DX: Displaced unspecified condyle fracture of lower end of unspecified femur, initial encounter for closed fracture: S72.413A

## 2014-03-09 ENCOUNTER — Emergency Department (HOSPITAL_COMMUNITY): Payer: Self-pay

## 2014-03-09 ENCOUNTER — Emergency Department (HOSPITAL_COMMUNITY)
Admission: EM | Admit: 2014-03-09 | Discharge: 2014-03-09 | Disposition: A | Discharge status: Home / Self Care | Payer: Self-pay | Attending: Emergency Medicine | Admitting: Emergency Medicine

## 2014-03-09 ENCOUNTER — Encounter (HOSPITAL_COMMUNITY): Payer: Self-pay | Admitting: Family Medicine

## 2014-03-09 DIAGNOSIS — J45901 Unspecified asthma with (acute) exacerbation: Secondary | ICD-10-CM | POA: Insufficient documentation

## 2014-03-09 DIAGNOSIS — I456 Pre-excitation syndrome: Secondary | ICD-10-CM | POA: Insufficient documentation

## 2014-03-09 DIAGNOSIS — Z720 Tobacco use: Secondary | ICD-10-CM | POA: Insufficient documentation

## 2014-03-09 DIAGNOSIS — R011 Cardiac murmur, unspecified: Secondary | ICD-10-CM | POA: Insufficient documentation

## 2014-03-09 DIAGNOSIS — S0990XA Unspecified injury of head, initial encounter: Secondary | ICD-10-CM | POA: Insufficient documentation

## 2014-03-09 DIAGNOSIS — F329 Major depressive disorder, single episode, unspecified: Secondary | ICD-10-CM | POA: Insufficient documentation

## 2014-03-09 DIAGNOSIS — Z79899 Other long term (current) drug therapy: Secondary | ICD-10-CM | POA: Insufficient documentation

## 2014-03-09 DIAGNOSIS — F10230 Alcohol dependence with withdrawal, uncomplicated: Secondary | ICD-10-CM | POA: Insufficient documentation

## 2014-03-09 DIAGNOSIS — Z8639 Personal history of other endocrine, nutritional and metabolic disease: Secondary | ICD-10-CM | POA: Insufficient documentation

## 2014-03-09 DIAGNOSIS — I10 Essential (primary) hypertension: Secondary | ICD-10-CM | POA: Insufficient documentation

## 2014-03-09 DIAGNOSIS — Z862 Personal history of diseases of the blood and blood-forming organs and certain disorders involving the immune mechanism: Secondary | ICD-10-CM | POA: Insufficient documentation

## 2014-03-09 DIAGNOSIS — K219 Gastro-esophageal reflux disease without esophagitis: Secondary | ICD-10-CM | POA: Insufficient documentation

## 2014-03-09 DIAGNOSIS — S72402A Unspecified fracture of lower end of left femur, initial encounter for closed fracture: Secondary | ICD-10-CM | POA: Insufficient documentation

## 2014-03-09 DIAGNOSIS — M1990 Unspecified osteoarthritis, unspecified site: Secondary | ICD-10-CM | POA: Insufficient documentation

## 2014-03-09 DIAGNOSIS — F419 Anxiety disorder, unspecified: Secondary | ICD-10-CM | POA: Insufficient documentation

## 2014-03-09 MED ORDER — IBUPROFEN 800 MG PO TABS: 800.0000 mg | ORAL_TABLET | Freq: Three times a day (TID) | ORAL | Status: DC

## 2014-03-09 MED ORDER — OXYCODONE-ACETAMINOPHEN 5-325 MG PO TABS
2.0000 | ORAL_TABLET | Freq: Once | ORAL | Status: AC
Start: 2014-03-09 — End: 2014-03-09
  Administered 2014-03-09: 2 via ORAL
  Filled 2014-03-09: qty 2

## 2014-03-09 MED ORDER — ONDANSETRON 4 MG PO TBDP
8.0000 mg | ORAL_TABLET | Freq: Once | ORAL | Status: AC
  Administered 2014-03-09: 8 mg via ORAL
  Filled 2014-03-09: qty 2
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
## 2014-03-09 NOTE — Discharge Instructions (Signed)
1. Medications: ibuprofen, usual home medications 2. Treatment: rest, drink plenty of fluids, ice, elevate, do not walk on leg, use crutches 3. Follow Up: Please followup with your primary doctor in 7-14 days; as discussed with Dr. Doran Durand for discussion of your diagnoses and further evaluation after today's visit; if you do not have a primary care doctor use the resource guide provided to find one;      Femur Fracture A femur fracture is a complete or incomplete break in the thighbone (femur). This is a serious injury, but is uncommon in sports. Usually the ankle, lower leg, or knee will become injured before the thighbone does.  SYMPTOMS   Severe pain in the thigh, at the time of injury.  Tenderness and inflammation in the thigh.  Bleeding and bruising in the thigh.  Inability to bear weight on the injured leg.  Visible deformity, if the fracture is complete and bone fragments separate enough to distort the leg shape.  Numbness and coldness in the leg and foot, beyond the fracture site, if blood supply is impaired. CAUSES   A fracture results when the force applied to a bone is greater that the bone can withstand. Thighbone fractures often result from a direct hit (trauma).  Indirect stress, caused by twisting or violent muscle contraction. RISK INCREASES WITH:   Contact sports (i.e. football, soccer, hockey), motor sports, and track and field events.  Previous or current bone problems (i.e. osteoporosis, tumors).  Metabolism disorders or hormone problems.  Nutrition deficiency or disorder (i.e. anorexia and bulimia).  Poor strength and flexibility. PREVENTION   Warm up and stretch properly before activity.  Maintain physical fitness:  Muscle strength.  Endurance and flexibility.  Cardiovascular fitness.  Wear proper protective equipment (i.e. thigh pads for football or hockey). PROGNOSIS  This condition can often be cured with proper treatment, though it may take  6 to 8 weeks to heal.  RELATED COMPLICATIONS   Low blood volume (hypovolemic) shock, due to blood loss in the thigh.  Failure of bone to heal (nonunion).  Bone heals in a poor position (malunion).  Increased pressure inside the leg(compartment syndrome), due to injury that disrupts blood supply to the leg and foot and injures the nerves and muscles of the leg and foot (uncommon).  Shortening of the injured bones.  Increased chance of repeated leg injury.  Stiff hip or knee.  Hindrance of normal bone growth in children.  Risks of surgery: infection, bleeding, injury to nerves (numbness, weakness, paralysis), need for further surgery.  Infection of open fractures (skin broken over fracture site).  Bone forming within the muscle (myositis ossificans).  Longer healing time, if activity is resumed too soon. TREATMENT  Treatment first involves the use of ice and medicine to reduce pain and inflammation. Treatment of thighbone fractures often requires surgery, to allow the bone to heal in proper alignment, and to reduce the risk of possible complications. Surgery often involves placing a metal rod down the center of the bone, or fixing plates and screws over the fracture line. Use of a cast is not common, because the cast would need to involve the stomach, low back, pelvis, and extend to the foot. For adults, traction (applying pressure using a device) is not often advised, due to the need for prolonged bed rest (6 to 8 weeks). In certain cases, bone growth stimulators may be advised. After the bone heals (with or without surgery), stretching and strengthening exercise is needed. Exercises may be done at home  or with a therapist. The rod, plate, and screws from surgery are only removed if they cause further discomfort.  MEDICATION   If pain medicine is needed, nonsteroidal anti-inflammatory medicines (aspirin and ibuprofen), or other minor pain relievers (acetaminophen), are often  advised.  Do not take pain medicine for 7 days before surgery.  Stronger pain relievers may be prescribed by your caregiver. Use only as directed and only as much as you need. SEEK MEDICAL CARE IF:   Symptoms get worse or do not improve in 2 weeks, despite treatment.  The following occur after restraint or surgery. (Report any of these signs immediately):  Swelling above or below the fracture site.  Severe, persistent pain.  Blue or gray skin below the fracture site, especially under the toenails. Numbness or loss of feeling below the fracture site.  New, unexplained symptoms develop. (Drugs used in treatment may produce side effects.) Document Released: 04/25/2005 Document Revised: 07/18/2011 Document Reviewed: 08/07/2008 [facility] Patient Information 2015 [place], [place]. This information is not intended to replace advice given to you by your health care provider. Make sure you discuss any questions you have with your health care provider.

## 2014-03-10 ENCOUNTER — Encounter (HOSPITAL_COMMUNITY): Payer: Self-pay | Admitting: *Deleted

## 2014-03-10 ENCOUNTER — Observation Stay (HOSPITAL_COMMUNITY)
Admission: EM | Admit: 2014-03-10 | Discharge: 2014-03-13 | Disposition: A | Discharge status: Home / Self Care | Payer: No Typology Code available for payment source | Attending: Internal Medicine | Admitting: Internal Medicine

## 2014-03-10 ENCOUNTER — Emergency Department (HOSPITAL_COMMUNITY)
Admission: EM | Admit: 2014-03-10 | Discharge: 2014-03-10 | Disposition: A | Discharge status: Home / Self Care | Payer: Self-pay | Attending: Emergency Medicine | Admitting: Emergency Medicine

## 2014-03-10 DIAGNOSIS — R000 Tachycardia, unspecified: Secondary | ICD-10-CM | POA: Insufficient documentation

## 2014-03-10 DIAGNOSIS — F332 Major depressive disorder, recurrent severe without psychotic features: Secondary | ICD-10-CM | POA: Diagnosis present

## 2014-03-10 DIAGNOSIS — I10 Essential (primary) hypertension: Secondary | ICD-10-CM | POA: Insufficient documentation

## 2014-03-10 DIAGNOSIS — G8911 Acute pain due to trauma: Secondary | ICD-10-CM | POA: Insufficient documentation

## 2014-03-10 DIAGNOSIS — Z791 Long term (current) use of non-steroidal anti-inflammatories (NSAID): Secondary | ICD-10-CM | POA: Insufficient documentation

## 2014-03-10 DIAGNOSIS — R011 Cardiac murmur, unspecified: Secondary | ICD-10-CM | POA: Insufficient documentation

## 2014-03-10 DIAGNOSIS — Z79899 Other long term (current) drug therapy: Secondary | ICD-10-CM | POA: Insufficient documentation

## 2014-03-10 DIAGNOSIS — F1010 Alcohol abuse, uncomplicated: Secondary | ICD-10-CM | POA: Insufficient documentation

## 2014-03-10 DIAGNOSIS — Z720 Tobacco use: Secondary | ICD-10-CM | POA: Insufficient documentation

## 2014-03-10 DIAGNOSIS — D649 Anemia, unspecified: Secondary | ICD-10-CM | POA: Insufficient documentation

## 2014-03-10 DIAGNOSIS — M1389 Other specified arthritis, multiple sites: Secondary | ICD-10-CM | POA: Insufficient documentation

## 2014-03-10 DIAGNOSIS — F419 Anxiety disorder, unspecified: Secondary | ICD-10-CM | POA: Insufficient documentation

## 2014-03-10 DIAGNOSIS — R1110 Vomiting, unspecified: Secondary | ICD-10-CM

## 2014-03-10 DIAGNOSIS — F17210 Nicotine dependence, cigarettes, uncomplicated: Secondary | ICD-10-CM | POA: Insufficient documentation

## 2014-03-10 DIAGNOSIS — G43909 Migraine, unspecified, not intractable, without status migrainosus: Secondary | ICD-10-CM | POA: Insufficient documentation

## 2014-03-10 DIAGNOSIS — Z8639 Personal history of other endocrine, nutritional and metabolic disease: Secondary | ICD-10-CM | POA: Insufficient documentation

## 2014-03-10 DIAGNOSIS — Z79891 Long term (current) use of opiate analgesic: Secondary | ICD-10-CM | POA: Insufficient documentation

## 2014-03-10 DIAGNOSIS — M25562 Pain in left knee: Secondary | ICD-10-CM | POA: Insufficient documentation

## 2014-03-10 DIAGNOSIS — Y900 Blood alcohol level of less than 20 mg/100 ml: Secondary | ICD-10-CM | POA: Insufficient documentation

## 2014-03-10 DIAGNOSIS — K860 Alcohol-induced chronic pancreatitis: Secondary | ICD-10-CM | POA: Insufficient documentation

## 2014-03-10 DIAGNOSIS — K859 Acute pancreatitis, unspecified: Secondary | ICD-10-CM | POA: Insufficient documentation

## 2014-03-10 DIAGNOSIS — K852 Alcohol induced acute pancreatitis without necrosis or infection: Secondary | ICD-10-CM

## 2014-03-10 DIAGNOSIS — F339 Major depressive disorder, recurrent, unspecified: Secondary | ICD-10-CM | POA: Insufficient documentation

## 2014-03-10 DIAGNOSIS — S72412S Displaced unspecified condyle fracture of lower end of left femur, sequela: Secondary | ICD-10-CM

## 2014-03-10 DIAGNOSIS — J45909 Unspecified asthma, uncomplicated: Secondary | ICD-10-CM | POA: Insufficient documentation

## 2014-03-10 DIAGNOSIS — K219 Gastro-esophageal reflux disease without esophagitis: Secondary | ICD-10-CM | POA: Insufficient documentation

## 2014-03-10 DIAGNOSIS — S72435S Nondisplaced fracture of medial condyle of left femur, sequela: Secondary | ICD-10-CM | POA: Insufficient documentation

## 2014-03-10 DIAGNOSIS — F329 Major depressive disorder, single episode, unspecified: Secondary | ICD-10-CM | POA: Insufficient documentation

## 2014-03-10 HISTORY — DX: Displaced unspecified condyle fracture of lower end of unspecified femur, initial encounter for closed fracture: S72.413A

## 2014-03-10 LAB — COMPREHENSIVE METABOLIC PANEL
ALBUMIN: 3.9 g/dL (ref 3.5–5.2)
ALK PHOS: 145 U/L — AB (ref 39–117)
ALT: 37 U/L (ref 0–53)
AST: 49 U/L — ABNORMAL HIGH (ref 0–37)
Anion gap: 22 — ABNORMAL HIGH (ref 5–15)
BUN: 7 mg/dL (ref 6–23)
CALCIUM: 9.2 mg/dL (ref 8.4–10.5)
CO2: 17 mEq/L — ABNORMAL LOW (ref 19–32)
CREATININE: 0.59 mg/dL (ref 0.50–1.35)
Chloride: 98 mEq/L (ref 96–112)
GFR calc non Af Amer: >90 mL/min (ref >90)
GLUCOSE: 97 mg/dL (ref 70–99)
POTASSIUM: 3.9 meq/L (ref 3.7–5.3)
Sodium: 137 mEq/L (ref 137–147)
TOTAL PROTEIN: 8.4 g/dL — AB (ref 6.0–8.3)
Total Bilirubin: 0.7 mg/dL (ref 0.3–1.2)

## 2014-03-10 LAB — URINALYSIS, ROUTINE W REFLEX MICROSCOPIC
Bilirubin Urine: NEGATIVE
Glucose, UA: NEGATIVE mg/dL
Hgb urine dipstick: NEGATIVE
Ketones, ur: NEGATIVE mg/dL
LEUKOCYTES UA: NEGATIVE
NITRITE: NEGATIVE
PROTEIN: NEGATIVE mg/dL
Specific Gravity, Urine: 1.012 (ref 1.005–1.030)
Urobilinogen, UA: 0.2 mg/dL (ref 0.0–1.0)
pH: 5 (ref 5.0–8.0)

## 2014-03-10 LAB — RAPID URINE DRUG SCREEN, HOSP PERFORMED
Amphetamines: NOT DETECTED
BARBITURATES: POSITIVE — AB
BENZODIAZEPINES: POSITIVE — AB
Cocaine: NOT DETECTED
Opiates: NOT DETECTED
Tetrahydrocannabinol: NOT DETECTED

## 2014-03-10 LAB — CBC WITH DIFFERENTIAL/PLATELET
BASOS PCT: 0 % (ref 0–1)
Basophils Absolute: 0 10*3/uL (ref 0.0–0.1)
EOS ABS: 0.3 10*3/uL (ref 0.0–0.7)
EOS PCT: 3 % (ref 0–5)
HCT: 34.5 % — ABNORMAL LOW (ref 39.0–52.0)
HEMOGLOBIN: 12.2 g/dL — AB (ref 13.0–17.0)
LYMPHS ABS: 2.9 10*3/uL (ref 0.7–4.0)
Lymphocytes Relative: 35 % (ref 12–46)
MCH: 31.8 pg (ref 26.0–34.0)
MCHC: 35.4 g/dL (ref 30.0–36.0)
MCV: 89.8 fL (ref 78.0–100.0)
MONOS PCT: 7 % (ref 3–12)
Monocytes Absolute: 0.6 10*3/uL (ref 0.1–1.0)
Neutro Abs: 4.4 10*3/uL (ref 1.7–7.7)
Neutrophils Relative %: 55 % (ref 43–77)
Platelets: 266 10*3/uL (ref 150–400)
RBC: 3.84 MIL/uL — ABNORMAL LOW (ref 4.22–5.81)
RDW: 14.8 % (ref 11.5–15.5)
WBC: 8.2 10*3/uL (ref 4.0–10.5)

## 2014-03-10 LAB — I-STAT TROPONIN, ED: TROPONIN I, POC: 0.01 ng/mL (ref 0.00–0.08)

## 2014-03-10 LAB — ETHANOL

## 2014-03-10 LAB — I-STAT CG4 LACTIC ACID, ED: LACTIC ACID, VENOUS: 1.76 mmol/L (ref 0.5–2.2)

## 2014-03-10 LAB — LIPASE, BLOOD: LIPASE: 255 U/L — AB (ref 11–59)

## 2014-03-10 MED ORDER — ONDANSETRON HCL 4 MG/2ML IJ SOLN: 4.0000 mg | Freq: Four times a day (QID) | INTRAMUSCULAR | Status: DC | PRN

## 2014-03-10 MED ORDER — SERTRALINE HCL 50 MG PO TABS
75.0000 mg | ORAL_TABLET | Freq: Every day | ORAL | Status: DC
  Administered 2014-03-10 – 2014-03-12 (×3): 75 mg via ORAL
  Filled 2014-03-10 (×3): qty 1

## 2014-03-10 MED ORDER — THIAMINE HCL 100 MG/ML IJ SOLN
Freq: Once | INTRAVENOUS | Status: AC
  Administered 2014-03-10: 22:00:00 via INTRAVENOUS
  Filled 2014-03-10: qty 1000

## 2014-03-10 MED ORDER — GI COCKTAIL ~~LOC~~
30.0000 mL | Freq: Once | ORAL | Status: AC
  Administered 2014-03-10: 30 mL via ORAL
  Filled 2014-03-10: qty 30

## 2014-03-10 MED ORDER — OXYCODONE HCL 5 MG PO TABS
5.0000 mg | ORAL_TABLET | ORAL | Status: DC | PRN
  Filled 2014-03-10: qty 1

## 2014-03-10 MED ORDER — MORPHINE SULFATE 4 MG/ML IJ SOLN
INTRAMUSCULAR | Status: AC
  Administered 2014-03-10: 4 mg
  Filled 2014-03-10: qty 1

## 2014-03-10 MED ORDER — IBUPROFEN 800 MG PO TABS
800.0000 mg | ORAL_TABLET | Freq: Once | ORAL | Status: AC
  Administered 2014-03-10: 800 mg via ORAL
  Filled 2014-03-10: qty 1

## 2014-03-10 MED ORDER — MORPHINE SULFATE 2 MG/ML IJ SOLN
2.0000 mg | INTRAMUSCULAR | Status: DC | PRN
  Administered 2014-03-11 (×3): 2 mg via INTRAVENOUS
  Filled 2014-03-10 (×4): qty 1

## 2014-03-10 MED ORDER — PANTOPRAZOLE SODIUM 40 MG PO TBEC
40.0000 mg | DELAYED_RELEASE_TABLET | Freq: Two times a day (BID) | ORAL | Status: DC
  Administered 2014-03-10 – 2014-03-13 (×6): 40 mg via ORAL
  Filled 2014-03-10 (×6): qty 1

## 2014-03-10 MED ORDER — ENOXAPARIN SODIUM 40 MG/0.4ML ~~LOC~~ SOLN
40.0000 mg | SUBCUTANEOUS | Status: DC
  Administered 2014-03-10 – 2014-03-12 (×3): 40 mg via SUBCUTANEOUS
  Filled 2014-03-10 (×5): qty 0.4

## 2014-03-10 MED ORDER — ACETAMINOPHEN 650 MG RE SUPP: 650.0000 mg | Freq: Four times a day (QID) | RECTAL | Status: DC | PRN

## 2014-03-10 MED ORDER — ONDANSETRON 4 MG PO TBDP
8.0000 mg | ORAL_TABLET | Freq: Once | ORAL | Status: AC
  Administered 2014-03-10: 8 mg via ORAL
  Filled 2014-03-10: qty 2

## 2014-03-10 MED ORDER — ACETAMINOPHEN 325 MG PO TABS: 650.0000 mg | ORAL_TABLET | Freq: Four times a day (QID) | ORAL | Status: DC | PRN

## 2014-03-10 MED ORDER — MORPHINE SULFATE 4 MG/ML IJ SOLN
4.0000 mg | Freq: Once | INTRAMUSCULAR | Status: AC
  Administered 2014-03-10: 4 mg via INTRAVENOUS
  Filled 2014-03-10: qty 1

## 2014-03-10 MED ORDER — SODIUM CHLORIDE 0.9 % IV BOLUS (SEPSIS)
1000.0000 mL | Freq: Once | INTRAVENOUS | Status: AC
  Administered 2014-03-10: 1000 mL via INTRAVENOUS

## 2014-03-10 MED ORDER — ALBUTEROL SULFATE HFA 108 (90 BASE) MCG/ACT IN AERS: 2.0000 | INHALATION_SPRAY | Freq: Four times a day (QID) | RESPIRATORY_TRACT | Status: DC | PRN

## 2014-03-10 MED ORDER — VITAMIN B-1 100 MG PO TABS
100.0000 mg | ORAL_TABLET | Freq: Every day | ORAL | Status: DC
  Administered 2014-03-11 – 2014-03-13 (×3): 100 mg via ORAL
  Filled 2014-03-10 (×4): qty 1

## 2014-03-10 MED ORDER — METOPROLOL TARTRATE 25 MG PO TABS
25.0000 mg | ORAL_TABLET | Freq: Two times a day (BID) | ORAL | Status: DC
  Administered 2014-03-10 – 2014-03-13 (×6): 25 mg via ORAL
  Filled 2014-03-10 (×8): qty 1

## 2014-03-10 MED ORDER — ONDANSETRON HCL 4 MG PO TABS: 4.0000 mg | ORAL_TABLET | Freq: Four times a day (QID) | ORAL | Status: DC | PRN

## 2014-03-10 MED ORDER — THIAMINE HCL 100 MG PO TABS: 100.0000 mg | ORAL_TABLET | Freq: Every day | ORAL | Status: DC

## 2014-03-10 MED ORDER — QUETIAPINE FUMARATE 100 MG PO TABS
100.0000 mg | ORAL_TABLET | Freq: Every day | ORAL | Status: DC
  Administered 2014-03-10 – 2014-03-12 (×3): 100 mg via ORAL
  Filled 2014-03-10 (×4): qty 1
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
## 2014-03-10 NOTE — ED Notes (Signed)
Admitting MD at bedside.

## 2014-03-10 NOTE — Discharge Instructions (Signed)
Continue ibuprofen every 6-8 hours for pain. Follow up with Dr. Doran Durand.  Knee Pain The knee is the complex joint between your thigh and your lower leg. It is made up of bones, tendons, ligaments, and cartilage. The bones that make up the knee are:  The femur in the thigh.  The tibia and fibula in the lower leg.  The patella or kneecap riding in the groove on the lower femur. CAUSES  Knee pain is a common complaint with many causes. A few of these causes are:  Injury, such as:  A ruptured ligament or tendon injury.  Torn cartilage.  Medical conditions, such as:  Gout  Arthritis  Infections  Overuse, over training, or overdoing a physical activity. Knee pain can be minor or severe. Knee pain can accompany debilitating injury. Minor knee problems often respond well to self-care measures or get well on their own. More serious injuries may need medical intervention or even surgery. SYMPTOMS The knee is complex. Symptoms of knee problems can vary widely. Some of the problems are:  Pain with movement and weight bearing.  Swelling and tenderness.  Buckling of the knee.  Inability to straighten or extend your knee.  Your knee locks and you cannot straighten it.  Warmth and redness with pain and fever.  Deformity or dislocation of the kneecap. DIAGNOSIS  Determining what is wrong may be very straight forward such as when there is an injury. It can also be challenging because of the complexity of the knee. Tests to make a diagnosis may include:  Your caregiver taking a history and doing a physical exam.  Routine X-rays can be used to rule out other problems. X-rays will not reveal a cartilage tear. Some injuries of the knee can be diagnosed by:  Arthroscopy a surgical technique by which a small video camera is inserted through tiny incisions on the sides of the knee. This procedure is used to examine and repair internal knee joint problems. Tiny instruments can be used during  arthroscopy to repair the torn knee cartilage (meniscus).  Arthrography is a radiology technique. A contrast liquid is directly injected into the knee joint. Internal structures of the knee joint then become visible on X-ray film.  An MRI scan is a non X-ray radiology procedure in which magnetic fields and a computer produce two- or three-dimensional images of the inside of the knee. Cartilage tears are often visible using an MRI scanner. MRI scans have largely replaced arthrography in diagnosing cartilage tears of the knee.  Blood work.  Examination of the fluid that helps to lubricate the knee joint (synovial fluid). This is done by taking a sample out using a needle and a syringe. TREATMENT The treatment of knee problems depends on the cause. Some of these treatments are:  Depending on the injury, proper casting, splinting, surgery, or physical therapy care will be needed.  Give yourself adequate recovery time. Do not overuse your joints. If you begin to get sore during workout routines, back off. Slow down or do fewer repetitions.  For repetitive activities such as cycling or running, maintain your strength and nutrition.  Alternate muscle groups. For example, if you are a weight lifter, work the upper body on one day and the lower body the next.  Either tight or weak muscles do not give the proper support for your knee. Tight or weak muscles do not absorb the stress placed on the knee joint. Keep the muscles surrounding the knee strong.  Take care of mechanical  problems.  If you have flat feet, orthotics or special shoes may help. See your caregiver if you need help.  Arch supports, sometimes with wedges on the inner or outer aspect of the heel, can help. These can shift pressure away from the side of the knee most bothered by osteoarthritis.  A brace called an "unloader" brace also may be used to help ease the pressure on the most arthritic side of the knee.  If your caregiver has  prescribed crutches, braces, wraps or ice, use as directed. The acronym for this is PRICE. This means protection, rest, ice, compression, and elevation.  Nonsteroidal anti-inflammatory drugs (NSAIDs), can help relieve pain. But if taken immediately after an injury, they may actually increase swelling. Take NSAIDs with food in your stomach. Stop them if you develop stomach problems. Do not take these if you have a history of ulcers, stomach pain, or bleeding from the bowel. Do not take without your caregiver's approval if you have problems with fluid retention, heart failure, or kidney problems.  For ongoing knee problems, physical therapy may be helpful.  Glucosamine and chondroitin are over-the-counter dietary supplements. Both may help relieve the pain of osteoarthritis in the knee. These medicines are different from the usual anti-inflammatory drugs. Glucosamine may decrease the rate of cartilage destruction.  Injections of a corticosteroid drug into your knee joint may help reduce the symptoms of an arthritis flare-up. They may provide pain relief that lasts a few months. You may have to wait a few months between injections. The injections do have a small increased risk of infection, water retention, and elevated blood sugar levels.  Hyaluronic acid injected into damaged joints may ease pain and provide lubrication. These injections may work by reducing inflammation. A series of shots may give relief for as long as 6 months.  Topical painkillers. Applying certain ointments to your skin may help relieve the pain and stiffness of osteoarthritis. Ask your pharmacist for suggestions. Many over the-counter products are approved for temporary relief of arthritis pain.  In some countries, doctors often prescribe topical NSAIDs for relief of chronic conditions such as arthritis and tendinitis. A review of treatment with NSAID creams found that they worked as well as oral medications but without the serious  side effects. PREVENTION  Maintain a healthy weight. Extra pounds put more strain on your joints.  Get strong, stay limber. Weak muscles are a common cause of knee injuries. Stretching is important. Include flexibility exercises in your workouts.  Be smart about exercise. If you have osteoarthritis, chronic knee pain or recurring injuries, you may need to change the way you exercise. This does not mean you have to stop being active. If your knees ache after jogging or playing basketball, consider switching to swimming, water aerobics, or other low-impact activities, at least for a few days a week. Sometimes limiting high-impact activities will provide relief.  Make sure your shoes fit well. Choose footwear that is right for your sport.  Protect your knees. Use the proper gear for knee-sensitive activities. Use kneepads when playing volleyball or laying carpet. Buckle your seat belt every time you drive. Most shattered kneecaps occur in car accidents.  Rest when you are tired. SEEK MEDICAL CARE IF:  You have knee pain that is continual and does not seem to be getting better.  SEEK IMMEDIATE MEDICAL CARE IF:  Your knee joint feels hot to the touch and you have a high fever. MAKE SURE YOU:   Understand these instructions.  Will  watch your condition.  Will get help right away if you are not doing well or get worse. Document Released: 02/20/2007 Document Revised: 07/18/2011 Document Reviewed: 02/20/2007 [facility] Patient Information 2015 [place], [place]. This information is not intended to replace advice given to you by your health care provider. Make sure you discuss any questions you have with your health care provider. Knee Fracture, Adult A knee fracture is a break in any of the bones of the lower part of the thigh bone, the upper part of the bones of the lower leg, or of the kneecap. When the bones no longer meet the way they are supposed to it is called a dislocation. Sometimes there can  be a dislocation along with fractures. SYMPTOMS  Symptoms may include pain, swelling, inability to bend the knee, deformity of the knee, and inability to walk.  DIAGNOSIS  This problem is usually diagnosed with x-rays. Special studies are sometimes done if a fracture is suspected but cannot be seen on ordinary x-rays. If vessels around the knee are injured, special tests may be done to see what the damage is. TREATMENT   The leg is usually splinted for the first couple of days to allow for swelling. After the swelling is down a cast is put on. Sometimes a cast is put on right away with the sides of the cast cut to allow the knee to swell. If the bones are in place, this may be all that is needed.  If the bones are out of place, medications for pain are given to allow them to be put back in place. If they are seriously out of place, surgery may be needed to hold the pieces or breaks in place using wires, pins, screws or metal plates.  Generally most fractures will heal in 4 to 6 weeks. HOME CARE INSTRUCTIONS   Use your crutches as directed.  To lessen the swelling, keep the injured leg elevated while sitting or lying down.  Apply ice to the injury for 15-20 minutes, 03-04 times per day while awake for 2 days. Put the ice in a plastic bag and place a thin towel between the bag of ice and your cast.  If you have a plaster or fiberglass cast:  Do not try to scratch the skin under the cast using sharp or pointed objects.  Check the skin around the cast every day. You may put lotion on any red or sore areas.  Keep your cast dry and clean.  If you have a plaster splint:  Wear the splint as directed.  You may loosen the elastic around the splint if your toes become numb, tingle, or turn cold or blue.  Do not put pressure on any part of your cast or splint; it may break. Rest your cast only on a pillow the first 24 hours until it is fully hardened.  Your cast or splint can be protected  during bathing with a plastic bag. Do not lower the cast or splint into water.  Only take over-the-counter or prescription medicines for pain, discomfort, or fever as directed by your caregiver.  See your caregiver soon if your cast gets damaged or breaks.  It is very important to keep all follow up appointments. Not following up as directed may result in a worsening of your condition or a failure of the fracture to heal properly. SEEK IMMEDIATE MEDICAL CARE IF:  You have continued severe pain.  You have more swelling than you did before the cast was put  on.  The area below the fracture becomes painful.  Your skin or toenails below the injury turn blue or gray, or feel cold or numb.  There is drainage coming from under the cast.  New, unexplained symptoms develop (drugs used in treatment may produce side effects). MAKE SURE YOU:   Understand these instructions.  Will watch your condition.  Will get help right away if you are not doing well or get worse. Document Released: 03/08/2006 Document Revised: 07/18/2011 Document Reviewed: 04/09/2007 [facility] Patient Information 2015 [place], [place]. This information is not intended to replace advice given to you by your health care provider. Make sure you discuss any questions you have with your health care provider.

## 2014-03-10 NOTE — ED Provider Notes (Signed)
Medical screening examination/treatment/procedure(s) were conducted as a shared visit with non-physician practitioner(s) and myself.  I personally evaluated the patient during the encounter.   EKG Interpretation None     43 year old male 73 ED visits in the last 6 months 9 admissions chronic pancreatitis chronic abdominal pain and chronic alcohol abuse continues to drink alcohol and was told to discontinue has moderate epigastric tenderness without rebound but with elevated lipase and elevated anion gap greater than 20 feel admit reasonable.  Babette Relic, MD 03/11/14 [phone]
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
## 2014-03-10 NOTE — Progress Notes (Signed)
2113 tct er to get report/ nurse unavailable. Andee Poles will give msg that I called. Johny Shock, RN

## 2014-03-10 NOTE — ED Notes (Addendum)
Pt states that he had chili and pizza and now has left upper abdominal pain. Pt has hx of pancreatitis. Pt reports N/V/D

## 2014-03-10 NOTE — ED Notes (Signed)
Pt ambulating independently w/ steady gait on d/c in no acute distress, A&Ox4. D/c instructions reviewed w/ pt - pt denies any further questions or concerns at present.  
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
## 2014-03-11 DIAGNOSIS — F1014 Alcohol abuse with alcohol-induced mood disorder: Secondary | ICD-10-CM

## 2014-03-11 DIAGNOSIS — F1010 Alcohol abuse, uncomplicated: Secondary | ICD-10-CM

## 2014-03-11 LAB — COMPREHENSIVE METABOLIC PANEL
ALT: 31 U/L (ref 0–53)
AST: 42 U/L — ABNORMAL HIGH (ref 0–37)
Albumin: 3.3 g/dL — ABNORMAL LOW (ref 3.5–5.2)
Alkaline Phosphatase: 121 U/L — ABNORMAL HIGH (ref 39–117)
Anion gap: 12 (ref 5–15)
BILIRUBIN TOTAL: 1.2 mg/dL (ref 0.3–1.2)
BUN: 5 mg/dL — ABNORMAL LOW (ref 6–23)
CALCIUM: 8.9 mg/dL (ref 8.4–10.5)
CHLORIDE: 105 meq/L (ref 96–112)
CO2: 22 meq/L (ref 19–32)
Creatinine, Ser: 0.52 mg/dL (ref 0.50–1.35)
GFR calc non Af Amer: >90 mL/min (ref >90)
GLUCOSE: 93 mg/dL (ref 70–99)
Potassium: 3.9 mEq/L (ref 3.7–5.3)
Sodium: 139 mEq/L (ref 137–147)
Total Protein: 7.1 g/dL (ref 6.0–8.3)

## 2014-03-11 LAB — CBC
HCT: 32.7 % — ABNORMAL LOW (ref 39.0–52.0)
Hemoglobin: 11.1 g/dL — ABNORMAL LOW (ref 13.0–17.0)
MCH: 30.8 pg (ref 26.0–34.0)
MCHC: 33.9 g/dL (ref 30.0–36.0)
MCV: 90.8 fL (ref 78.0–100.0)
Platelets: 226 10*3/uL (ref 150–400)
RBC: 3.6 MIL/uL — AB (ref 4.22–5.81)
RDW: 14.6 % (ref 11.5–15.5)
WBC: 6.5 10*3/uL (ref 4.0–10.5)

## 2014-03-11 LAB — PROTIME-INR
INR: 1.1 (ref 0.00–1.49)
PROTHROMBIN TIME: 14.3 s (ref 11.6–15.2)

## 2014-03-11 MED ORDER — GI COCKTAIL ~~LOC~~
30.0000 mL | Freq: Three times a day (TID) | ORAL | Status: DC | PRN
  Administered 2014-03-12 (×2): 30 mL via ORAL
  Filled 2014-03-11 (×4): qty 30

## 2014-03-11 MED ORDER — OXYCODONE HCL 5 MG PO TABS
10.0000 mg | ORAL_TABLET | ORAL | Status: DC | PRN
  Administered 2014-03-12 – 2014-03-13 (×4): 10 mg via ORAL
  Filled 2014-03-11 (×4): qty 2

## 2014-03-11 MED ORDER — MORPHINE SULFATE 2 MG/ML IJ SOLN
1.0000 mg | INTRAMUSCULAR | Status: DC | PRN
  Administered 2014-03-11 – 2014-03-12 (×5): 2 mg via INTRAVENOUS
  Filled 2014-03-11 (×5): qty 1

## 2014-03-11 MED ORDER — POLYETHYLENE GLYCOL 3350 17 G PO PACK
17.0000 g | PACK | Freq: Two times a day (BID) | ORAL | Status: DC
  Administered 2014-03-11: 17 g via ORAL
  Filled 2014-03-11 (×6): qty 1

## 2014-03-11 MED ORDER — ADULT MULTIVITAMIN W/MINERALS CH
1.0000 | ORAL_TABLET | Freq: Every day | ORAL | Status: DC
  Administered 2014-03-11 – 2014-03-13 (×3): 1 via ORAL
  Filled 2014-03-11 (×4): qty 1

## 2014-03-11 MED ORDER — LORAZEPAM 2 MG/ML IJ SOLN: 1.0000 mg | Freq: Four times a day (QID) | INTRAMUSCULAR | Status: DC | PRN

## 2014-03-11 MED ORDER — PANCRELIPASE (LIP-PROT-AMYL) 12000-38000 UNITS PO CPEP
24000.0000 [IU] | ORAL_CAPSULE | Freq: Three times a day (TID) | ORAL | Status: DC
  Administered 2014-03-11: 24000 [IU] via ORAL
  Filled 2014-03-11 (×5): qty 2

## 2014-03-11 MED ORDER — LORAZEPAM 1 MG PO TABS
0.0000 mg | ORAL_TABLET | Freq: Two times a day (BID) | ORAL | Status: DC
  Filled 2014-03-11: qty 1

## 2014-03-11 MED ORDER — LORAZEPAM 1 MG PO TABS
0.0000 mg | ORAL_TABLET | Freq: Four times a day (QID) | ORAL | Status: AC
  Administered 2014-03-11: 2 mg via ORAL
  Administered 2014-03-11 – 2014-03-12 (×6): 1 mg via ORAL
  Filled 2014-03-11 (×5): qty 1
  Filled 2014-03-11: qty 2
  Filled 2014-03-11: qty 1

## 2014-03-11 MED ORDER — LORAZEPAM 1 MG PO TABS
1.0000 mg | ORAL_TABLET | Freq: Four times a day (QID) | ORAL | Status: DC | PRN
Start: 2014-03-11 — End: 2014-03-13

## 2014-03-11 MED ORDER — LOPERAMIDE HCL 2 MG PO CAPS: 2.0000 mg | ORAL_CAPSULE | ORAL | Status: DC | PRN

## 2014-03-11 MED ORDER — BOOST / RESOURCE BREEZE PO LIQD
1.0000 | Freq: Three times a day (TID) | ORAL | Status: DC
  Administered 2014-03-11 (×3): 1 via ORAL

## 2014-03-11 MED ORDER — SENNOSIDES-DOCUSATE SODIUM 8.6-50 MG PO TABS
2.0000 | ORAL_TABLET | Freq: Two times a day (BID) | ORAL | Status: DC
  Administered 2014-03-11 – 2014-03-13 (×3): 2 via ORAL
  Filled 2014-03-11 (×3): qty 2

## 2014-03-11 MED ORDER — FOLIC ACID 1 MG PO TABS
1.0000 mg | ORAL_TABLET | Freq: Every day | ORAL | Status: DC
  Administered 2014-03-11 – 2014-03-13 (×3): 1 mg via ORAL
  Filled 2014-03-11 (×4): qty 1

## 2014-03-11 NOTE — Progress Notes (Signed)
Chaplain visited to respond to consult concerning AD. Pt was asleep. Will return later.   Delford Field, Chaplain 03/11/2014
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
## 2014-03-11 NOTE — Progress Notes (Signed)
UR completed 

## 2014-03-11 NOTE — Consult Note (Signed)
[facility] Face-to-Face Psychiatry Consult   Reason for Consult:  Alcohol abuse vs dependence and depression Referring Physician:  Jonetta Osgood, MD  [name] is an 43 y.o. male. Total Time spent with patient: 45 minutes  Assessment: AXIS I:  Substance Induced Mood Disorder and Alcohol abuse AXIS II:  Deferred AXIS III:   Past Medical History  Diagnosis Date  . Hypertension   . Asthma   . Pancreatitis   . Cocaine abuse   . Depression   . H/O suicide attempt   . Heart murmur     "when he was little" (03/06/2013)  . Shortness of breath     "can happen at anytime" (03/06/2013)  . Anemia   . H/O hiatal hernia   . GERD (gastroesophageal reflux disease)   . Anxiety   . WPW (Wolff-Parkinson-White syndrome)     [name] 03/06/2013  . High cholesterol   . Migraine     "monthly" (01/30/2014)  . Arthritis     "knees; arms; elbows" (01/30/2014)  . Femoral condyle fracture 03/08/2014    left medial/notes 03/09/2014   AXIS IV:  other psychosocial or environmental problems, problems related to social environment and problems with primary support group AXIS V:  51-60 moderate symptoms  Plan:  Refer to residential substance abuse rehab treatment when medically stable No evidence of imminent risk to self or others at present.   Patient does not meet criteria for psychiatric inpatient admission. Supportive therapy provided about ongoing stressors.  Appreciate psychiatric consultation Please contact [phone] or [phone] if needs further assistance  Subjective:   [name] is a 43 y.o. male patient admitted with abdominal pain with nausea and vomiting.  HPI:  [name] is a 43 y.o. male admitted to [facility] medical floor due to abdominal pain, nausea and vomiting. Psychiatric consultation requested due to increased symptoms of depression and alcohol relapse. Patient stated that his GF had an abortion which leads him to be relapsed on alcohol abuse vs dependence. He has stated  that he was not with his GF since July 2015. Patient has depressed mood, increased psychomotor retardation, some what sedated due to pain medications, ativan and seroquel but able to participate in this evaluation. Patient denied current suicidal or homicidal ideation, intention or plans. He has requested alcohol detox treatment and also agree with residential substance abuse rehab treatment. Patient has been compliant with his medications and no reported adverse effects. He has no evidence of psychosis. He has two admission to [facility] for alcohol detox treatment and also reportedly received rehab treatment at [facility] but did not lost long enough prior to relapse.   Medical History: Patient with Past medical history of hypertension, chronic pancreatitis, chronic alcohol abuse, GERD, mood disorder, substance abuse. The patient presented with complaints of abdominal pain with nausea and vomiting that has been ongoing since Monday. He was recently visiting ER for a fall leading to fracture. He was seen by orthopedic and was recommended to take ibuprofen. He was drinking alcohol to help him with his pain and has been drinking 2 24 ounces of beer and 3 shots of vodka. After that he started having diarrhea along with nausea and vomiting. At the time of my evaluation he mentions he has pain which is located on the left side of the abdomen and 8 x 10 in intensity. He denies any radiation. He denies any chest pain or shortness of breath. He mentions he is compliant with all his  medications. The patient is coming from home And at his baseline independent for most of his ADL.  Review of Systems: as mentioned in the history of present illness.  A Comprehensive review of the other systems is negative.  HPI Elements:   Location:  alcohol abuse and depression. Quality:  moderate. Severity:  chronic. Timing:  relapse. Duration:  few days. Context:  psychosocial problems.  Past Psychiatric History: Past  Medical History  Diagnosis Date  . Hypertension   . Asthma   . Pancreatitis   . Cocaine abuse   . Depression   . H/O suicide attempt   . Heart murmur     "when he was little" (03/06/2013)  . Shortness of breath     "can happen at anytime" (03/06/2013)  . Anemia   . H/O hiatal hernia   . GERD (gastroesophageal reflux disease)   . Anxiety   . WPW (Wolff-Parkinson-White syndrome)     [name] 03/06/2013  . High cholesterol   . Migraine     "monthly" (01/30/2014)  . Arthritis     "knees; arms; elbows" (01/30/2014)  . Femoral condyle fracture 03/08/2014    left medial/notes 03/09/2014    reports that he has been smoking Cigarettes.  He has a 15 pack-year smoking history. His smokeless tobacco use includes Chew. He reports that he drinks about 28.2 oz of alcohol per week. He reports that he uses illicit drugs (Cocaine and Marijuana). Family History  Problem Relation Age of Onset  . Hypertension Other   . Coronary artery disease Other      Living Arrangements: Parent, Other relatives   Abuse/Neglect [facility]) Physical Abuse: Denies Verbal Abuse: Denies Sexual Abuse: Denies Allergies:   Allergies  Allergen Reactions  . Shellfish-Derived Products Nausea And Vomiting  . Trazodone And Nefazodone Other (See Comments)    Muscle spasms    ACT Assessment Complete:  NO Objective: Blood pressure 106/64, pulse 81, temperature 97.6 F (36.4 C), temperature source Oral, resp. rate 16, height $RemoveBe'5\' 9"'qmBIIyequ$  (1.753 m), weight 61.281 kg (135 lb 1.6 oz), SpO2 94 %.Body mass index is 19.94 kg/(m^2). Results for orders placed or performed during the hospital encounter of 03/10/14 (from the past 72 hour(s))  CBC with Differential     Status: Abnormal   Collection Time: 03/10/14  4:51 PM  Result Value Ref Range   WBC 8.2 4.0 - 10.5 K/uL   RBC 3.84 (L) 4.22 - 5.81 MIL/uL   Hemoglobin 12.2 (L) 13.0 - 17.0 g/dL   HCT 34.5 (L) 39.0 - 52.0 %   MCV 89.8 78.0 - 100.0 fL   MCH 31.8 26.0 - 34.0 pg   MCHC 35.4  30.0 - 36.0 g/dL   RDW 14.8 11.5 - 15.5 %   Platelets 266 150 - 400 K/uL   Neutrophils Relative % 55 43 - 77 %   Neutro Abs 4.4 1.7 - 7.7 K/uL   Lymphocytes Relative 35 12 - 46 %   Lymphs Abs 2.9 0.7 - 4.0 K/uL   Monocytes Relative 7 3 - 12 %   Monocytes Absolute 0.6 0.1 - 1.0 K/uL   Eosinophils Relative 3 0 - 5 %   Eosinophils Absolute 0.3 0.0 - 0.7 K/uL   Basophils Relative 0 0 - 1 %   Basophils Absolute 0.0 0.0 - 0.1 K/uL  Comprehensive metabolic panel     Status: Abnormal   Collection Time: 03/10/14  4:51 PM  Result Value Ref Range   Sodium 137 137 - 147 mEq/L  Potassium 3.9 3.7 - 5.3 mEq/L   Chloride 98 96 - 112 mEq/L   CO2 17 (L) 19 - 32 mEq/L   Glucose, Bld 97 70 - 99 mg/dL   BUN 7 6 - 23 mg/dL   Creatinine, Ser 5.24 0.50 - 1.35 mg/dL   Calcium 9.2 8.4 - 58.5 mg/dL   Total Protein 8.4 (H) 6.0 - 8.3 g/dL   Albumin 3.9 3.5 - 5.2 g/dL   AST 49 (H) 0 - 37 U/L   ALT 37 0 - 53 U/L   Alkaline Phosphatase 145 (H) 39 - 117 U/L   Total Bilirubin 0.7 0.3 - 1.2 mg/dL   GFR calc non Af Amer >90 >90 mL/min   GFR calc Af Amer >90 >90 mL/min    Comment: (NOTE) The eGFR has been calculated using the CKD EPI equation. This calculation has not been validated in all clinical situations. eGFR's persistently <90 mL/min signify possible Chronic Kidney Disease.    Anion gap 22 (H) 5 - 15  Lipase, blood     Status: Abnormal   Collection Time: 03/10/14  4:51 PM  Result Value Ref Range   Lipase 255 (H) 11 - 59 U/L  Ethanol     Status: None   Collection Time: 03/10/14  7:31 PM  Result Value Ref Range   Alcohol, Ethyl (B) <11 0 - 11 mg/dL    Comment:        LOWEST DETECTABLE LIMIT FOR SERUM ALCOHOL IS 11 mg/dL FOR MEDICAL PURPOSES ONLY   I-stat troponin, ED     Status: None   Collection Time: 03/10/14  7:39 PM  Result Value Ref Range   Troponin i, poc 0.01 0.00 - 0.08 ng/mL   Comment 3            Comment: Due to the release kinetics of cTnI, a negative result within the first  hours of the onset of symptoms does not rule out myocardial infarction with certainty. If myocardial infarction is still suspected, repeat the test at appropriate intervals.   I-Stat CG4 Lactic Acid, ED     Status: None   Collection Time: 03/10/14  7:44 PM  Result Value Ref Range   Lactic Acid, Venous 1.76 0.5 - 2.2 mmol/L  Urinalysis, Routine w reflex microscopic     Status: None   Collection Time: 03/10/14  7:56 PM  Result Value Ref Range   Color, Urine YELLOW YELLOW   APPearance CLEAR CLEAR   Specific Gravity, Urine 1.012 1.005 - 1.030   pH 5.0 5.0 - 8.0   Glucose, UA NEGATIVE NEGATIVE mg/dL   Hgb urine dipstick NEGATIVE NEGATIVE   Bilirubin Urine NEGATIVE NEGATIVE   Ketones, ur NEGATIVE NEGATIVE mg/dL   Protein, ur NEGATIVE NEGATIVE mg/dL   Urobilinogen, UA 0.2 0.0 - 1.0 mg/dL   Nitrite NEGATIVE NEGATIVE   Leukocytes, UA NEGATIVE NEGATIVE    Comment: MICROSCOPIC NOT DONE ON URINES WITH NEGATIVE PROTEIN, BLOOD, LEUKOCYTES, NITRITE, OR GLUCOSE <1000 mg/dL.  Urine rapid drug screen (hosp performed)     Status: Abnormal   Collection Time: 03/10/14  7:56 PM  Result Value Ref Range   Opiates NONE DETECTED NONE DETECTED   Cocaine NONE DETECTED NONE DETECTED   Benzodiazepines POSITIVE (A) NONE DETECTED   Amphetamines NONE DETECTED NONE DETECTED   Tetrahydrocannabinol NONE DETECTED NONE DETECTED   Barbiturates POSITIVE (A) NONE DETECTED    Comment:        DRUG SCREEN FOR MEDICAL PURPOSES ONLY.  IF CONFIRMATION IS  NEEDED FOR ANY PURPOSE, NOTIFY LAB WITHIN 5 DAYS.        LOWEST DETECTABLE LIMITS FOR URINE DRUG SCREEN Drug Class       Cutoff (ng/mL) Amphetamine      1000 Barbiturate      200 Benzodiazepine   563 Tricyclics       149 Opiates          300 Cocaine          300 THC              50   Comprehensive metabolic panel     Status: Abnormal   Collection Time: 03/11/14  4:57 AM  Result Value Ref Range   Sodium 139 137 - 147 mEq/L   Potassium 3.9 3.7 - 5.3 mEq/L    Chloride 105 96 - 112 mEq/L   CO2 22 19 - 32 mEq/L   Glucose, Bld 93 70 - 99 mg/dL   BUN 5 (L) 6 - 23 mg/dL   Creatinine, Ser 0.52 0.50 - 1.35 mg/dL   Calcium 8.9 8.4 - 10.5 mg/dL   Total Protein 7.1 6.0 - 8.3 g/dL   Albumin 3.3 (L) 3.5 - 5.2 g/dL   AST 42 (H) 0 - 37 U/L   ALT 31 0 - 53 U/L   Alkaline Phosphatase 121 (H) 39 - 117 U/L   Total Bilirubin 1.2 0.3 - 1.2 mg/dL   GFR calc non Af Amer >90 >90 mL/min   GFR calc Af Amer >90 >90 mL/min    Comment: (NOTE) The eGFR has been calculated using the CKD EPI equation. This calculation has not been validated in all clinical situations. eGFR's persistently <90 mL/min signify possible Chronic Kidney Disease.    Anion gap 12 5 - 15  CBC     Status: Abnormal   Collection Time: 03/11/14  4:57 AM  Result Value Ref Range   WBC 6.5 4.0 - 10.5 K/uL   RBC 3.60 (L) 4.22 - 5.81 MIL/uL   Hemoglobin 11.1 (L) 13.0 - 17.0 g/dL   HCT 32.7 (L) 39.0 - 52.0 %   MCV 90.8 78.0 - 100.0 fL   MCH 30.8 26.0 - 34.0 pg   MCHC 33.9 30.0 - 36.0 g/dL   RDW 14.6 11.5 - 15.5 %   Platelets 226 150 - 400 K/uL  Protime-INR     Status: None   Collection Time: 03/11/14  4:57 AM  Result Value Ref Range   Prothrombin Time 14.3 11.6 - 15.2 seconds   INR 1.10 0.00 - 1.49   Labs are reviewed and are pertinent for barbiturates and benzo's.  Current Facility-Administered Medications  Medication Dose Route Frequency Provider Last Rate Last Dose  . acetaminophen (TYLENOL) tablet 650 mg  650 mg Oral Q6H PRN Berle Mull, MD       Or  . acetaminophen (TYLENOL) suppository 650 mg  650 mg Rectal Q6H PRN Berle Mull, MD      . albuterol (PROVENTIL HFA;VENTOLIN HFA) 108 (90 BASE) MCG/ACT inhaler 2 puff  2 puff Inhalation Q6H PRN Berle Mull, MD      . enoxaparin (LOVENOX) injection 40 mg  40 mg Subcutaneous Q24H Berle Mull, MD   40 mg at 03/10/14 2336  . feeding supplement (RESOURCE BREEZE) (RESOURCE BREEZE) liquid 1 Container  1 Container Oral TID BM Baird Lyons,  RD   1 Container at 03/11/14 1516  . folic acid (FOLVITE) tablet 1 mg  1 mg Oral Daily Berle Mull, MD  1 mg at 03/11/14 1121  . gi cocktail (Maalox,Lidocaine,Donnatal)  30 mL Oral TID PRN Jonetta Osgood, MD      . lipase/protease/amylase (CREON) capsule 24,000 Units  24,000 Units Oral TID WC Shanker Kristeen Mans, MD      . loperamide (IMODIUM) capsule 2-4 mg  2-4 mg Oral PRN Shanker Kristeen Mans, MD      . LORazepam (ATIVAN) tablet 1 mg  1 mg Oral Q6H PRN Berle Mull, MD       Or  . LORazepam (ATIVAN) injection 1 mg  1 mg Intravenous Q6H PRN Berle Mull, MD      . LORazepam (ATIVAN) tablet 0-4 mg  0-4 mg Oral Q6H Berle Mull, MD   1 mg at 03/11/14 1516   Followed by  . [START ON 03/13/2014] LORazepam (ATIVAN) tablet 0-4 mg  0-4 mg Oral Q12H Berle Mull, MD      . metoprolol tartrate (LOPRESSOR) tablet 25 mg  25 mg Oral BID Berle Mull, MD   25 mg at 03/11/14 1000  . morphine 2 MG/ML injection 1-2 mg  1-2 mg Intravenous Q4H PRN Jonetta Osgood, MD   2 mg at 03/11/14 1306  . multivitamin with minerals tablet 1 tablet  1 tablet Oral Daily Berle Mull, MD   1 tablet at 03/11/14 1000  . ondansetron (ZOFRAN) tablet 4 mg  4 mg Oral Q6H PRN Berle Mull, MD       Or  . ondansetron (ZOFRAN) injection 4 mg  4 mg Intravenous Q6H PRN Berle Mull, MD      . oxyCODONE (Oxy IR/ROXICODONE) immediate release tablet 10-15 mg  10-15 mg Oral Q4H PRN Shanker Kristeen Mans, MD      . pantoprazole (PROTONIX) EC tablet 40 mg  40 mg Oral BID Berle Mull, MD   40 mg at 03/11/14 1001  . polyethylene glycol (MIRALAX / GLYCOLAX) packet 17 g  17 g Oral BID Jonetta Osgood, MD   17 g at 03/11/14 1516  . QUEtiapine (SEROQUEL) tablet 100 mg  100 mg Oral QHS Berle Mull, MD   100 mg at 03/10/14 2159  . senna-docusate (Senokot-S) tablet 2 tablet  2 tablet Oral BID Jonetta Osgood, MD   2 tablet at 03/11/14 1516  . sertraline (ZOLOFT) tablet 75 mg  75 mg Oral Daily Berle Mull, MD   75 mg at 03/11/14 1000  . thiamine  (VITAMIN B-1) tablet 100 mg  100 mg Oral Daily Berle Mull, MD   100 mg at 03/11/14 1000    Psychiatric Specialty Exam: Physical Exam as per history and physical  Review of Systems  Constitutional: Positive for malaise/fatigue.  Cardiovascular: Positive for palpitations.  Gastrointestinal: Positive for nausea, vomiting and abdominal pain.  Musculoskeletal: Positive for joint pain and falls.  Neurological: Positive for dizziness and weakness.  Psychiatric/Behavioral: Positive for depression and substance abuse.    Blood pressure 106/64, pulse 81, temperature 97.6 F (36.4 C), temperature source Oral, resp. rate 16, height $RemoveBe'5\' 9"'hKValdAyi$  (1.753 m), weight 61.281 kg (135 lb 1.6 oz), SpO2 94 %.Body mass index is 19.94 kg/(m^2).  General Appearance: Casual  Eye Contact::  Good  Speech:  Clear and Coherent  Volume:  Decreased  Mood:  Anxious and Depressed  Affect:  Congruent  Thought Process:  Coherent and Goal Directed  Orientation:  Full (Time, Place, and Person)  Thought Content:  WDL  Suicidal Thoughts:  No  Homicidal Thoughts:  No  Memory:  Immediate;   Fair Recent;  Fair  Judgement:  Fair  Insight:  Shallow  Psychomotor Activity:  Decreased  Concentration:  Good  Recall:  Good  Fund of Knowledge:Good  Language: Good  Akathisia:  NA  Handed:  Right  AIMS (if indicated):     Assets:  Communication Skills Housing Intimacy Leisure Time Resilience Social Support  Sleep:      Musculoskeletal: Strength & Muscle Tone: within normal limits Gait & Station: normal Patient leans: N/A  Treatment Plan Summary: Daily contact with patient to assess and evaluate symptoms and progress in treatment Medication management Ativan protocol for alcohol detox treatment and supportive treatment Continue Seroquel 100 mg at bed time and increase zoloft 100 mg PO QD starting tomorrow Refer to psych social service for possible alcohol residential rehab treatment when medically  stable  Amadi Frady,JANARDHAHA R. 03/11/2014 3:31 PM

## 2014-03-12 LAB — BASIC METABOLIC PANEL
Anion gap: 12 (ref 5–15)
BUN: 4 mg/dL — ABNORMAL LOW (ref 6–23)
CALCIUM: 9.3 mg/dL (ref 8.4–10.5)
CO2: 21 mEq/L (ref 19–32)
Chloride: 101 mEq/L (ref 96–112)
Creatinine, Ser: 0.48 mg/dL — ABNORMAL LOW (ref 0.50–1.35)
GFR calc non Af Amer: >90 mL/min (ref >90)
GLUCOSE: 101 mg/dL — AB (ref 70–99)
Potassium: 4.2 mEq/L (ref 3.7–5.3)
Sodium: 134 mEq/L — ABNORMAL LOW (ref 137–147)

## 2014-03-12 LAB — LIPASE, BLOOD: Lipase: 216 U/L — ABNORMAL HIGH (ref 11–59)

## 2014-03-12 MED ORDER — MORPHINE SULFATE 2 MG/ML IJ SOLN
1.0000 mg | Freq: Four times a day (QID) | INTRAMUSCULAR | Status: DC | PRN
  Administered 2014-03-12 – 2014-03-13 (×2): 1 mg via INTRAVENOUS
  Filled 2014-03-12 (×2): qty 1

## 2014-03-12 MED ORDER — PANCRELIPASE (LIP-PROT-AMYL) 12000-38000 UNITS PO CPEP
36000.0000 [IU] | ORAL_CAPSULE | Freq: Three times a day (TID) | ORAL | Status: DC
  Administered 2014-03-12 – 2014-03-13 (×5): 36000 [IU] via ORAL
  Filled 2014-03-12 (×7): qty 3

## 2014-03-12 MED ORDER — ENSURE COMPLETE PO LIQD
237.0000 mL | Freq: Three times a day (TID) | ORAL | Status: DC
  Administered 2014-03-12 – 2014-03-13 (×4): 237 mL via ORAL

## 2014-03-12 MED ORDER — MORPHINE SULFATE 2 MG/ML IJ SOLN
1.0000 mg | INTRAMUSCULAR | Status: DC | PRN
  Administered 2014-03-12 (×2): 1 mg via INTRAVENOUS
  Filled 2014-03-12 (×2): qty 1

## 2014-03-12 MED ORDER — SERTRALINE HCL 100 MG PO TABS
100.0000 mg | ORAL_TABLET | Freq: Every day | ORAL | Status: DC
  Administered 2014-03-13: 100 mg via ORAL
  Filled 2014-03-12 (×2): qty 1

## 2014-03-12 NOTE — Discharge Instructions (Signed)
[facility] , patient has appointment on March 20, 2014 at 1130 . At discharge patient can take prescriptions to [place] for assistance .

## 2014-03-12 NOTE — Care Management Note (Addendum)
  Page 2 of 2   03/12/2014     1:45:35 PM CARE MANAGEMENT NOTE 03/12/2014  Patient:  [name], [name]   Account Number:  [id number]  Date Initiated:  03/12/2014  Documentation initiated by:  Magdalen Spatz  Subjective/Objective Assessment:     Action/Plan:   Anticipated DC Date:     Anticipated DC Plan:    In-house referral  [place]  CM consult  [facility]  Medication Assistance      Choice offered to / List presented to:             Status of service:   Medicare Important Message given?   (If response is "NO", the following Medicare IM given date fields will be blank) Date Medicare IM given:   Medicare IM given by:   Date Additional Medicare IM given:   Additional Medicare IM given by:    Discharge Disposition:    Per UR Regulation:    If discussed at Long Length of Stay Meetings, dates discussed:    Comments:  03-12-14 Patient aware of appointment and appointment information on discharge instructions. Magdalen Spatz RN BSN 03-12-14 Spoke with Sharee Pimple at [facility] and [facility] , patient has appointment on March 20, 2014 at 1130 . At discharge patient can take prescriptions to [place] for assistance . Magdalen Spatz RN BSN [phone]     03-12-14 Consult for medication assistance . Patient has been set up with [facility] on previous admission. Patient confirmed this. Gave patient [facility] contact information .  I have reach [place] they will call back to schedule appointment.  Will confirm patient can go to pharmacy at [facility] for assistance with prescriptions.  Magdalen Spatz RN BSN [phone]

## 2014-03-12 NOTE — Progress Notes (Signed)
UR completed 

## 2014-03-12 NOTE — Plan of Care (Signed)
Problem: Phase I Progression Outcomes Goal: OOB as tolerated unless otherwise ordered Outcome: Completed/Met Date Met:  03/12/14 Goal: Voiding-avoid urinary catheter unless indicated Outcome: Completed/Met Date Met:  03/12/14 Goal: Hemodynamically stable Outcome: Completed/Met Date Met:  03/12/14
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
## 2014-03-13 MED ORDER — ENSURE COMPLETE PO LIQD: 237.0000 mL | Freq: Three times a day (TID) | ORAL | Status: DC

## 2014-03-13 MED ORDER — OXYCODONE HCL 5 MG PO TABS: 15.0000 mg | ORAL_TABLET | ORAL | Status: DC | PRN

## 2014-03-13 MED ORDER — MORPHINE SULFATE 2 MG/ML IJ SOLN
0.5000 mg | Freq: Four times a day (QID) | INTRAMUSCULAR | Status: DC | PRN
  Administered 2014-03-13: 0.5 mg via INTRAVENOUS
  Filled 2014-03-13: qty 1

## 2014-03-13 MED ORDER — OXYCODONE HCL 10 MG PO TABS: 10.0000 mg | ORAL_TABLET | Freq: Four times a day (QID) | ORAL | Status: DC | PRN

## 2014-03-13 NOTE — Progress Notes (Signed)
Patient discharged to home with instructions verbalized understanding.

## 2014-03-13 NOTE — Clinical Social Work Psych Note (Signed)
Psych CSW spoke with pt at bedside regarding ETOH resources.  Pt is seeking inpatient/residential treatment at this time.  [facility] currently has a 1-2 week wait for a male bed.  Pt was re-educated on how to place himself on the waitlist for the various inpatient treatment facilities and is agreeable to followup once dc'd.  Psych CSW also shared resources for detox and outpatient services/treatment.  Pt is aware of these services and was educated on Scientist, physiological.  Pt exhibited understanding through teachback.  Pt is agreeable to dc home with resources for followup.  Nonnie Done, [place] [phone]  Psychiatric & Orthopedics (5N 1-16) Clinical Social Worker
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
## 2014-03-17 ENCOUNTER — Emergency Department (HOSPITAL_COMMUNITY)
Admission: EM | Admit: 2014-03-17 | Discharge: 2014-03-18 | Disposition: A | Discharge status: Home / Self Care | Payer: Self-pay | Attending: Emergency Medicine | Admitting: Emergency Medicine

## 2014-03-17 ENCOUNTER — Encounter (HOSPITAL_COMMUNITY): Payer: Self-pay | Admitting: Emergency Medicine

## 2014-03-17 DIAGNOSIS — Z79899 Other long term (current) drug therapy: Secondary | ICD-10-CM | POA: Insufficient documentation

## 2014-03-17 DIAGNOSIS — J45909 Unspecified asthma, uncomplicated: Secondary | ICD-10-CM | POA: Insufficient documentation

## 2014-03-17 DIAGNOSIS — G43909 Migraine, unspecified, not intractable, without status migrainosus: Secondary | ICD-10-CM | POA: Insufficient documentation

## 2014-03-17 DIAGNOSIS — D649 Anemia, unspecified: Secondary | ICD-10-CM | POA: Insufficient documentation

## 2014-03-17 DIAGNOSIS — F419 Anxiety disorder, unspecified: Secondary | ICD-10-CM | POA: Insufficient documentation

## 2014-03-17 DIAGNOSIS — R000 Tachycardia, unspecified: Secondary | ICD-10-CM | POA: Insufficient documentation

## 2014-03-17 DIAGNOSIS — Z8739 Personal history of other diseases of the musculoskeletal system and connective tissue: Secondary | ICD-10-CM | POA: Insufficient documentation

## 2014-03-17 DIAGNOSIS — F1010 Alcohol abuse, uncomplicated: Secondary | ICD-10-CM | POA: Insufficient documentation

## 2014-03-17 DIAGNOSIS — K860 Alcohol-induced chronic pancreatitis: Secondary | ICD-10-CM | POA: Insufficient documentation

## 2014-03-17 DIAGNOSIS — Z720 Tobacco use: Secondary | ICD-10-CM | POA: Insufficient documentation

## 2014-03-17 DIAGNOSIS — Z8781 Personal history of (healed) traumatic fracture: Secondary | ICD-10-CM | POA: Insufficient documentation

## 2014-03-17 DIAGNOSIS — F329 Major depressive disorder, single episode, unspecified: Secondary | ICD-10-CM | POA: Insufficient documentation

## 2014-03-17 DIAGNOSIS — K219 Gastro-esophageal reflux disease without esophagitis: Secondary | ICD-10-CM | POA: Insufficient documentation

## 2014-03-17 DIAGNOSIS — R011 Cardiac murmur, unspecified: Secondary | ICD-10-CM | POA: Insufficient documentation

## 2014-03-17 DIAGNOSIS — Z8639 Personal history of other endocrine, nutritional and metabolic disease: Secondary | ICD-10-CM | POA: Insufficient documentation

## 2014-03-17 DIAGNOSIS — R109 Unspecified abdominal pain: Secondary | ICD-10-CM

## 2014-03-17 DIAGNOSIS — I10 Essential (primary) hypertension: Secondary | ICD-10-CM | POA: Insufficient documentation

## 2014-03-17 DIAGNOSIS — R112 Nausea with vomiting, unspecified: Secondary | ICD-10-CM

## 2014-03-17 LAB — COMPREHENSIVE METABOLIC PANEL
ALK PHOS: 157 U/L — AB (ref 39–117)
ALT: 45 U/L (ref 0–53)
ANION GAP: 25 — AB (ref 5–15)
AST: 49 U/L — ABNORMAL HIGH (ref 0–37)
Albumin: 4.1 g/dL (ref 3.5–5.2)
BUN: 7 mg/dL (ref 6–23)
CO2: 16 meq/L — AB (ref 19–32)
Calcium: 9.4 mg/dL (ref 8.4–10.5)
Chloride: 91 mEq/L — ABNORMAL LOW (ref 96–112)
Creatinine, Ser: 0.56 mg/dL (ref 0.50–1.35)
GFR calc Af Amer: >90 mL/min (ref >90)
Glucose, Bld: 65 mg/dL — ABNORMAL LOW (ref 70–99)
POTASSIUM: 3.7 meq/L (ref 3.7–5.3)
SODIUM: 132 meq/L — AB (ref 137–147)
Total Bilirubin: 0.4 mg/dL (ref 0.3–1.2)
Total Protein: 8.8 g/dL — ABNORMAL HIGH (ref 6.0–8.3)

## 2014-03-17 LAB — CBC WITH DIFFERENTIAL/PLATELET
Basophils Absolute: 0 10*3/uL (ref 0.0–0.1)
Basophils Relative: 0 % (ref 0–1)
EOS ABS: 0.2 10*3/uL (ref 0.0–0.7)
EOS PCT: 2 % (ref 0–5)
HCT: 35.1 % — ABNORMAL LOW (ref 39.0–52.0)
HEMOGLOBIN: 12.1 g/dL — AB (ref 13.0–17.0)
LYMPHS ABS: 3.4 10*3/uL (ref 0.7–4.0)
LYMPHS PCT: 31 % (ref 12–46)
MCH: 31.2 pg (ref 26.0–34.0)
MCHC: 34.5 g/dL (ref 30.0–36.0)
MCV: 90.5 fL (ref 78.0–100.0)
Monocytes Absolute: 0.6 10*3/uL (ref 0.1–1.0)
Monocytes Relative: 6 % (ref 3–12)
NEUTROS PCT: 61 % (ref 43–77)
Neutro Abs: 6.6 10*3/uL (ref 1.7–7.7)
PLATELETS: 252 10*3/uL (ref 150–400)
RBC: 3.88 MIL/uL — ABNORMAL LOW (ref 4.22–5.81)
RDW: 15.2 % (ref 11.5–15.5)
WBC: 10.7 10*3/uL — AB (ref 4.0–10.5)

## 2014-03-17 LAB — URINALYSIS, ROUTINE W REFLEX MICROSCOPIC
BILIRUBIN URINE: NEGATIVE
GLUCOSE, UA: NEGATIVE mg/dL
Hgb urine dipstick: NEGATIVE
KETONES UR: 15 mg/dL — AB
Leukocytes, UA: NEGATIVE
Nitrite: NEGATIVE
PROTEIN: NEGATIVE mg/dL
Specific Gravity, Urine: 1.011 (ref 1.005–1.030)
UROBILINOGEN UA: 0.2 mg/dL (ref 0.0–1.0)
pH: 5 (ref 5.0–8.0)

## 2014-03-17 LAB — LIPASE, BLOOD: Lipase: 42 U/L (ref 11–59)

## 2014-03-17 MED ORDER — GI COCKTAIL ~~LOC~~
30.0000 mL | Freq: Once | ORAL | Status: AC
  Administered 2014-03-17: 30 mL via ORAL
  Filled 2014-03-17: qty 30

## 2014-03-17 MED ORDER — ONDANSETRON 4 MG PO TBDP
8.0000 mg | ORAL_TABLET | Freq: Once | ORAL | Status: AC
  Administered 2014-03-17: 8 mg via ORAL
  Filled 2014-03-17: qty 2

## 2014-03-17 MED ORDER — ONDANSETRON HCL 4 MG/2ML IJ SOLN
4.0000 mg | Freq: Once | INTRAMUSCULAR | Status: AC
  Administered 2014-03-17: 4 mg via INTRAVENOUS
  Filled 2014-03-17: qty 2

## 2014-03-17 MED ORDER — SODIUM CHLORIDE 0.9 % IV BOLUS (SEPSIS)
1000.0000 mL | Freq: Once | INTRAVENOUS | Status: AC
  Administered 2014-03-17: 1000 mL via INTRAVENOUS

## 2014-03-17 MED ORDER — SODIUM CHLORIDE 0.9 % IV BOLUS (SEPSIS)
1000.0000 mL | Freq: Once | INTRAVENOUS | Status: AC
Start: 2014-03-17 — End: 2014-03-18
  Administered 2014-03-18: 1000 mL via INTRAVENOUS

## 2014-03-17 MED ORDER — HYDROMORPHONE HCL 1 MG/ML IJ SOLN
1.0000 mg | Freq: Once | INTRAMUSCULAR | Status: AC
  Administered 2014-03-18: 1 mg via INTRAVENOUS
  Filled 2014-03-17: qty 1

## 2014-03-17 MED ORDER — HYDROMORPHONE HCL 1 MG/ML IJ SOLN
1.0000 mg | Freq: Once | INTRAMUSCULAR | Status: AC
  Administered 2014-03-17: 1 mg via INTRAVENOUS
  Filled 2014-03-17: qty 1

## 2014-03-17 NOTE — ED Provider Notes (Signed)
CSN: [id number]     Arrival date & time 03/17/14  2042 History   First MD Initiated Contact with Patient 03/17/14 2210     Chief Complaint  Patient presents with  . Abdominal Pain     (Consider location/radiation/quality/duration/timing/severity/associated sxs/prior Treatment) HPI Comments: Patient with a history of Chronic Pancreatitis and Alcohol Abuse presents today with a chief complaint of abdominal pain.  He reports onset of pain at 6 PM and that the pain has been constant since that time.  He reports drinking two 40 ounce beers earlier today.  He reports that the pain is located in the epigastric area and does not radiate.  Pain is similar to the pain that he has had with Pancreatitis in the past.  He reports that the pain is associated with nausea and several episodes of vomiting.  He denies constipation, diarrhea, fever, chills, chest pain, SOB, or urinary symptoms.  He reports that he took Oxycodone for the pain, which he does not feel helped.  The history is provided by the patient.    Past Medical History  Diagnosis Date  . Hypertension   . Asthma   . Pancreatitis   . Cocaine abuse   . Depression   . H/O suicide attempt   . Heart murmur     "when he was little" (03/06/2013)  . Shortness of breath     "can happen at anytime" (03/06/2013)  . Anemia   . H/O hiatal hernia   . GERD (gastroesophageal reflux disease)   . Anxiety   . WPW (Wolff-Parkinson-White syndrome)     [name] 03/06/2013  . High cholesterol   . Migraine     "monthly" (01/30/2014)  . Arthritis     "knees; arms; elbows" (01/30/2014)  . Femoral condyle fracture 03/08/2014    left medial/notes 03/09/2014   Past Surgical History  Procedure Laterality Date  . Facial fracture surgery Left 1990's    "result of trauma"   . Eye surgery Left 1990's    "result of trauma"    Family History  Problem Relation Age of Onset  . Hypertension Other   . Coronary artery disease Other    History  Substance Use  Topics  . Smoking status: Current Every Day Smoker -- 0.50 packs/day for 30 years    Types: Cigarettes  . Smokeless tobacco: Current User    Types: Chew  . Alcohol Use: 28.2 oz/week    47 Cans of beer per week     Comment: 11/2//2015 "I drink 2, 40oz  beers per day"    Review of Systems  All other systems reviewed and are negative.     Allergies  Shellfish-derived products and Trazodone and nefazodone  Home Medications   Prior to Admission medications   Medication Sig Start Date End Date Taking? Authorizing Provider  albuterol (PROVENTIL HFA;VENTOLIN HFA) 108 (90 BASE) MCG/ACT inhaler Inhale 2 puffs into the lungs every 6 (six) hours as needed for wheezing or shortness of breath.   Yes Historical Provider, MD  feeding supplement, ENSURE COMPLETE, (ENSURE COMPLETE) LIQD Take 237 mLs by mouth 3 (three) times daily between meals. 03/13/14  Yes Shanker Kristeen Mans, MD  lipase/protease/amylase (CREON) 12000 UNITS CPEP capsule Take 2 capsules (24,000 Units total) by mouth 3 (three) times daily with meals. 02/06/14  Yes [name], MD  loratadine (CLARITIN) 10 MG tablet Take 1 tablet (10 mg total) by mouth daily. 02/25/14  Yes Marianne L York, PA-C  metoprolol tartrate (LOPRESSOR) 25 MG tablet  Take 1 tablet (25 mg total) by mouth 2 (two) times daily. 02/06/14  Yes [name], MD  Multiple Vitamin (MULTIVITAMIN) capsule Take 1 capsule by mouth daily. 02/06/14  Yes Olugbemiga Essie Christine, MD  oxyCODONE 10 MG TABS Take 1-1.5 tablets (10-15 mg total) by mouth every 6 (six) hours as needed for moderate pain. 03/13/14  Yes Shanker Kristeen Mans, MD  pantoprazole (PROTONIX) 40 MG tablet Take 1 tablet (40 mg total) by mouth 2 (two) times daily. 11/28/13  Yes [name], MD  promethazine (PHENERGAN) 25 MG tablet Take 1 tablet (25 mg total) by mouth every 6 (six) hours as needed for nausea or vomiting. 02/25/14  Yes Marianne L York, PA-C  QUEtiapine (SEROQUEL) 100 MG tablet Take 1 tablet  (100 mg total) by mouth at bedtime. For mood control 01/15/14  Yes Shanker Kristeen Mans, MD  sertraline (ZOLOFT) 25 MG tablet Take 75 mg by mouth daily.   Yes Historical Provider, MD  thiamine 100 MG tablet Take 100 mg by mouth daily.    Yes Historical Provider, MD  loperamide (IMODIUM) 2 MG capsule Take 1-2 capsules (2-4 mg total) by mouth as needed for diarrhea or loose stools. 01/23/14   Marianne L York, PA-C   BP 133/77 mmHg  Pulse 101  Temp(Src) 98.1 F (36.7 C) (Oral)  Resp 18  Ht 5\' 9"  (1.753 m)  Wt 140 lb (63.504 kg)  BMI 20.67 kg/m2  SpO2 100% Physical Exam  Constitutional: He appears well-developed and well-nourished.  Uncomfortable appearing  HENT:  Head: Normocephalic and atraumatic.  Mouth/Throat: Oropharynx is clear and moist.  Eyes: EOM are normal.  Neck: Normal range of motion. Neck supple.  Cardiovascular: Regular rhythm and normal heart sounds.  Tachycardia present.   Patient tachycardic, which is baseline.  Pulmonary/Chest: Effort normal and breath sounds normal.  Abdominal: Soft. Bowel sounds are normal. He exhibits no distension and no mass. There is tenderness in the epigastric area. There is no rebound, no guarding and negative Murphy's sign.  Musculoskeletal: Normal range of motion.  Neurological: He is alert.  Skin: Skin is warm and dry.  Psychiatric: He has a normal mood and affect.  Nursing note and vitals reviewed.   ED Course  Procedures (including critical care time) Labs Review Labs Reviewed  CBC WITH DIFFERENTIAL - Abnormal; Notable for the following:    WBC 10.7 (*)    RBC 3.88 (*)    Hemoglobin 12.1 (*)    HCT 35.1 (*)    All other components within normal limits  COMPREHENSIVE METABOLIC PANEL - Abnormal; Notable for the following:    Sodium 132 (*)    Chloride 91 (*)    CO2 16 (*)    Glucose, Bld 65 (*)    Total Protein 8.8 (*)    AST 49 (*)    Alkaline Phosphatase 157 (*)    Anion gap 25 (*)    All other components within normal limits   LIPASE, BLOOD  URINALYSIS, ROUTINE W REFLEX MICROSCOPIC    Imaging Review No results found.   EKG Interpretation None      11:30 PM Reassessed patient.  He reports that he is still having significant pain.  Will order another dose of pain medication and reassess. 12:51 AM Reassessed patient.  He reports that pain and nausea have improved.  Will fluid challenge.  1:21 AM Patient tolerating PO liquids.  He reports that his pain and nausea are controlled at this time.  Abdomen:  Soft  with mild epigastric abdominal tenderness. MDM   Final diagnoses:  None   Patient with a history of Pancreatitis and Alcohol Abuse presents today with abdominal pain.  He reports that his abdominal pain is similar to previous episodes of Pancreatitis.  He admits to drinking alcohol earlier today.  Labs today unremarkable.  Lipase WNL.  Patient given IVF, Zofran, and pain medication.  Symptoms improved in the ED.  Patient tolerating PO liquids prior to discharge.  Feel that the patient is stable for discharge.  Return precautions given.     Hyman Bible, PA-C 03/19/14 [place], PA-C 03/19/14 [id number]  Blanchie Dessert, MD 03/19/14 [phone]

## 2014-03-17 NOTE — ED Notes (Signed)
Pt reports after eating steak and beer he developed abdominal pain. Hx pancreatitis and was hospitalized for it 10 days ago. Pt c/o vomiting and diarrhea.

## 2014-03-17 NOTE — ED Notes (Signed)
Report to Ben, RN.  Pt care transferred 

## 2014-03-18 MED ORDER — PROMETHAZINE HCL 25 MG PO TABS: 25.0000 mg | ORAL_TABLET | Freq: Four times a day (QID) | ORAL | Status: DC | PRN

## 2014-03-18 MED ORDER — OXYCODONE-ACETAMINOPHEN 5-325 MG PO TABS
2.0000 | ORAL_TABLET | Freq: Once | ORAL | Status: AC
  Administered 2014-03-18: 2 via ORAL
  Filled 2014-03-18: qty 2

## 2014-03-18 MED ORDER — OXYCODONE-ACETAMINOPHEN 5-325 MG PO TABS: 1.0000 | ORAL_TABLET | Freq: Four times a day (QID) | ORAL | Status: DC | PRN

## 2014-03-19 ENCOUNTER — Inpatient Hospital Stay (HOSPITAL_COMMUNITY)
Admission: EM | Admit: 2014-03-19 | Discharge: 2014-03-21 | DRG: 439 | Disposition: A | Discharge status: Home / Self Care | Payer: Self-pay | Attending: Internal Medicine | Admitting: Internal Medicine

## 2014-03-19 ENCOUNTER — Encounter (HOSPITAL_COMMUNITY): Payer: Self-pay | Admitting: *Deleted

## 2014-03-19 DIAGNOSIS — Z91013 Allergy to seafood: Secondary | ICD-10-CM

## 2014-03-19 DIAGNOSIS — F17200 Nicotine dependence, unspecified, uncomplicated: Secondary | ICD-10-CM | POA: Diagnosis present

## 2014-03-19 DIAGNOSIS — F17220 Nicotine dependence, chewing tobacco, uncomplicated: Secondary | ICD-10-CM | POA: Diagnosis present

## 2014-03-19 DIAGNOSIS — G43909 Migraine, unspecified, not intractable, without status migrainosus: Secondary | ICD-10-CM | POA: Diagnosis present

## 2014-03-19 DIAGNOSIS — M170 Bilateral primary osteoarthritis of knee: Secondary | ICD-10-CM | POA: Diagnosis present

## 2014-03-19 DIAGNOSIS — F329 Major depressive disorder, single episode, unspecified: Secondary | ICD-10-CM | POA: Diagnosis present

## 2014-03-19 DIAGNOSIS — E8729 Other acidosis: Secondary | ICD-10-CM

## 2014-03-19 DIAGNOSIS — Z6821 Body mass index (BMI) 21.0-21.9, adult: Secondary | ICD-10-CM

## 2014-03-19 DIAGNOSIS — I10 Essential (primary) hypertension: Secondary | ICD-10-CM | POA: Diagnosis present

## 2014-03-19 DIAGNOSIS — E872 Acidosis: Secondary | ICD-10-CM | POA: Diagnosis present

## 2014-03-19 DIAGNOSIS — E440 Moderate protein-calorie malnutrition: Secondary | ICD-10-CM | POA: Diagnosis present

## 2014-03-19 DIAGNOSIS — K219 Gastro-esophageal reflux disease without esophagitis: Secondary | ICD-10-CM | POA: Diagnosis present

## 2014-03-19 DIAGNOSIS — F419 Anxiety disorder, unspecified: Secondary | ICD-10-CM | POA: Diagnosis present

## 2014-03-19 DIAGNOSIS — Z720 Tobacco use: Secondary | ICD-10-CM | POA: Diagnosis present

## 2014-03-19 DIAGNOSIS — Z888 Allergy status to other drugs, medicaments and biological substances status: Secondary | ICD-10-CM

## 2014-03-19 DIAGNOSIS — K852 Alcohol induced acute pancreatitis: Principal | ICD-10-CM | POA: Diagnosis present

## 2014-03-19 DIAGNOSIS — E780 Pure hypercholesterolemia: Secondary | ICD-10-CM | POA: Diagnosis present

## 2014-03-19 DIAGNOSIS — F1014 Alcohol abuse with alcohol-induced mood disorder: Secondary | ICD-10-CM | POA: Diagnosis present

## 2014-03-19 DIAGNOSIS — M19022 Primary osteoarthritis, left elbow: Secondary | ICD-10-CM | POA: Diagnosis present

## 2014-03-19 DIAGNOSIS — K861 Other chronic pancreatitis: Secondary | ICD-10-CM | POA: Diagnosis present

## 2014-03-19 DIAGNOSIS — Z915 Personal history of self-harm: Secondary | ICD-10-CM

## 2014-03-19 DIAGNOSIS — J45909 Unspecified asthma, uncomplicated: Secondary | ICD-10-CM | POA: Diagnosis present

## 2014-03-19 DIAGNOSIS — M19021 Primary osteoarthritis, right elbow: Secondary | ICD-10-CM | POA: Diagnosis present

## 2014-03-19 DIAGNOSIS — I456 Pre-excitation syndrome: Secondary | ICD-10-CM | POA: Diagnosis present

## 2014-03-19 DIAGNOSIS — Z87898 Personal history of other specified conditions: Secondary | ICD-10-CM

## 2014-03-19 HISTORY — DX: Reserved for inherently not codable concepts without codable children: IMO0001

## 2014-03-19 HISTORY — DX: Alcohol dependence, uncomplicated: F10.20

## 2014-03-19 LAB — URINALYSIS, ROUTINE W REFLEX MICROSCOPIC
BILIRUBIN URINE: NEGATIVE
GLUCOSE, UA: NEGATIVE mg/dL
HGB URINE DIPSTICK: NEGATIVE
KETONES UR: NEGATIVE mg/dL
Leukocytes, UA: NEGATIVE
Nitrite: NEGATIVE
PROTEIN: NEGATIVE mg/dL
Specific Gravity, Urine: 1.004 — ABNORMAL LOW (ref 1.005–1.030)
UROBILINOGEN UA: 0.2 mg/dL (ref 0.0–1.0)
pH: 6 (ref 5.0–8.0)

## 2014-03-19 LAB — CBC WITH DIFFERENTIAL/PLATELET
BASOS ABS: 0 10*3/uL (ref 0.0–0.1)
BASOS PCT: 0 % (ref 0–1)
EOS ABS: 0.2 10*3/uL (ref 0.0–0.7)
EOS PCT: 3 % (ref 0–5)
HEMATOCRIT: 33.8 % — AB (ref 39.0–52.0)
Hemoglobin: 11.4 g/dL — ABNORMAL LOW (ref 13.0–17.0)
LYMPHS PCT: 31 % (ref 12–46)
Lymphs Abs: 2.6 10*3/uL (ref 0.7–4.0)
MCH: 30.9 pg (ref 26.0–34.0)
MCHC: 33.7 g/dL (ref 30.0–36.0)
MCV: 91.6 fL (ref 78.0–100.0)
MONO ABS: 0.7 10*3/uL (ref 0.1–1.0)
Monocytes Relative: 8 % (ref 3–12)
Neutro Abs: 5.1 10*3/uL (ref 1.7–7.7)
Neutrophils Relative %: 58 % (ref 43–77)
Platelets: 247 10*3/uL (ref 150–400)
RBC: 3.69 MIL/uL — ABNORMAL LOW (ref 4.22–5.81)
RDW: 15.4 % (ref 11.5–15.5)
WBC: 8.6 10*3/uL (ref 4.0–10.5)

## 2014-03-19 LAB — COMPREHENSIVE METABOLIC PANEL
ALT: 32 U/L (ref 0–53)
AST: 41 U/L — ABNORMAL HIGH (ref 0–37)
Albumin: 3.6 g/dL (ref 3.5–5.2)
Alkaline Phosphatase: 127 U/L — ABNORMAL HIGH (ref 39–117)
Anion gap: 22 — ABNORMAL HIGH (ref 5–15)
BUN: <3 mg/dL — ABNORMAL LOW (ref 6–23)
CO2: 17 meq/L — AB (ref 19–32)
CREATININE: 0.45 mg/dL — AB (ref 0.50–1.35)
Calcium: 9.1 mg/dL (ref 8.4–10.5)
Chloride: 98 mEq/L (ref 96–112)
Glucose, Bld: 74 mg/dL (ref 70–99)
Potassium: 3.9 mEq/L (ref 3.7–5.3)
Sodium: 137 mEq/L (ref 137–147)
TOTAL PROTEIN: 7.7 g/dL (ref 6.0–8.3)
Total Bilirubin: 0.3 mg/dL (ref 0.3–1.2)

## 2014-03-19 LAB — LIPASE, BLOOD: Lipase: 37 U/L (ref 11–59)

## 2014-03-19 MED ORDER — SODIUM CHLORIDE 0.9 % IV BOLUS (SEPSIS)
1000.0000 mL | Freq: Once | INTRAVENOUS | Status: AC
  Administered 2014-03-19: 1000 mL via INTRAVENOUS

## 2014-03-19 MED ORDER — ACETAMINOPHEN 325 MG PO TABS: 650.0000 mg | ORAL_TABLET | ORAL | Status: DC | PRN

## 2014-03-19 MED ORDER — MULTIVITAMINS PO CAPS: 1.0000 | ORAL_CAPSULE | Freq: Every day | ORAL | Status: DC

## 2014-03-19 MED ORDER — ALBUTEROL SULFATE HFA 108 (90 BASE) MCG/ACT IN AERS: 2.0000 | INHALATION_SPRAY | Freq: Four times a day (QID) | RESPIRATORY_TRACT | Status: DC | PRN

## 2014-03-19 MED ORDER — LORAZEPAM 1 MG PO TABS
0.0000 mg | ORAL_TABLET | Freq: Four times a day (QID) | ORAL | Status: DC
  Administered 2014-03-20: 1 mg via ORAL
  Filled 2014-03-19: qty 1

## 2014-03-19 MED ORDER — ONDANSETRON HCL 4 MG/2ML IJ SOLN
4.0000 mg | Freq: Once | INTRAMUSCULAR | Status: AC
  Administered 2014-03-19: 4 mg via INTRAVENOUS
  Filled 2014-03-19: qty 2

## 2014-03-19 MED ORDER — LORAZEPAM 1 MG PO TABS
0.0000 mg | ORAL_TABLET | Freq: Two times a day (BID) | ORAL | Status: DC
Start: 2014-03-22 — End: 2014-03-20

## 2014-03-19 MED ORDER — GI COCKTAIL ~~LOC~~
30.0000 mL | Freq: Once | ORAL | Status: AC
  Administered 2014-03-19: 30 mL via ORAL
  Filled 2014-03-19: qty 30

## 2014-03-19 MED ORDER — HYDROMORPHONE HCL 1 MG/ML IJ SOLN
1.0000 mg | Freq: Once | INTRAMUSCULAR | Status: AC
  Administered 2014-03-19: 1 mg via INTRAVENOUS
  Filled 2014-03-19: qty 1

## 2014-03-19 MED ORDER — SERTRALINE HCL 50 MG PO TABS: 75.0000 mg | ORAL_TABLET | Freq: Every day | ORAL | Status: DC

## 2014-03-19 MED ORDER — LORATADINE 10 MG PO TABS: 10.0000 mg | ORAL_TABLET | Freq: Every day | ORAL | Status: DC

## 2014-03-19 MED ORDER — ONDANSETRON HCL 4 MG PO TABS
4.0000 mg | ORAL_TABLET | Freq: Three times a day (TID) | ORAL | Status: DC | PRN
  Administered 2014-03-20: 4 mg via ORAL
  Filled 2014-03-19: qty 1

## 2014-03-19 MED ORDER — PANTOPRAZOLE SODIUM 40 MG PO TBEC
40.0000 mg | DELAYED_RELEASE_TABLET | Freq: Two times a day (BID) | ORAL | Status: DC
  Administered 2014-03-20: 40 mg via ORAL
  Filled 2014-03-19: qty 1

## 2014-03-19 MED ORDER — ADULT MULTIVITAMIN W/MINERALS CH
1.0000 | ORAL_TABLET | Freq: Every day | ORAL | Status: DC
  Administered 2014-03-20: 1 via ORAL
  Filled 2014-03-19: qty 1

## 2014-03-19 MED ORDER — METOPROLOL TARTRATE 25 MG PO TABS
25.0000 mg | ORAL_TABLET | Freq: Two times a day (BID) | ORAL | Status: DC
  Administered 2014-03-20 – 2014-03-21 (×4): 25 mg via ORAL
  Filled 2014-03-19 (×5): qty 1

## 2014-03-19 MED ORDER — HYDROMORPHONE HCL 1 MG/ML IJ SOLN
1.0000 mg | Freq: Once | INTRAMUSCULAR | Status: AC
  Administered 2014-03-20: 1 mg via INTRAVENOUS
  Filled 2014-03-19: qty 1

## 2014-03-19 MED ORDER — ALUM & MAG HYDROXIDE-SIMETH 200-200-20 MG/5ML PO SUSP
30.0000 mL | ORAL | Status: DC | PRN
  Administered 2014-03-20: 30 mL via ORAL
  Filled 2014-03-19: qty 30

## 2014-03-19 MED ORDER — SODIUM CHLORIDE 0.9 % IV SOLN
1000.0000 mL | INTRAVENOUS | Status: DC
  Administered 2014-03-20: 1000 mL via INTRAVENOUS

## 2014-03-19 MED ORDER — LORAZEPAM 1 MG PO TABS: 1.0000 mg | ORAL_TABLET | Freq: Three times a day (TID) | ORAL | Status: DC | PRN

## 2014-03-19 MED ORDER — ZOLPIDEM TARTRATE 5 MG PO TABS: 5.0000 mg | ORAL_TABLET | Freq: Every evening | ORAL | Status: DC | PRN

## 2014-03-19 MED ORDER — QUETIAPINE FUMARATE 100 MG PO TABS
100.0000 mg | ORAL_TABLET | Freq: Every day | ORAL | Status: DC
  Administered 2014-03-20 (×2): 100 mg via ORAL
  Filled 2014-03-19: qty 1
  Filled 2014-03-19: qty 4
  Filled 2014-03-19: qty 1

## 2014-03-19 MED ORDER — NICOTINE 21 MG/24HR TD PT24
21.0000 mg | MEDICATED_PATCH | Freq: Every day | TRANSDERMAL | Status: DC
  Administered 2014-03-20 – 2014-03-21 (×3): 21 mg via TRANSDERMAL
  Filled 2014-03-19 (×3): qty 1

## 2014-03-19 MED ORDER — KETOROLAC TROMETHAMINE 30 MG/ML IJ SOLN
30.0000 mg | Freq: Once | INTRAMUSCULAR | Status: AC
  Administered 2014-03-19: 30 mg via INTRAVENOUS
  Filled 2014-03-19: qty 1

## 2014-03-19 MED ORDER — SODIUM CHLORIDE 0.9 % IV SOLN
1000.0000 mL | Freq: Once | INTRAVENOUS | Status: AC
  Administered 2014-03-20: 1000 mL via INTRAVENOUS

## 2014-03-19 MED ORDER — PANCRELIPASE (LIP-PROT-AMYL) 12000-38000 UNITS PO CPEP
24000.0000 [IU] | ORAL_CAPSULE | Freq: Three times a day (TID) | ORAL | Status: DC
  Filled 2014-03-19: qty 2

## 2014-03-19 MED ORDER — VITAMIN B-1 100 MG PO TABS
100.0000 mg | ORAL_TABLET | Freq: Every day | ORAL | Status: DC
  Administered 2014-03-20: 100 mg via ORAL
  Filled 2014-03-19: qty 1

## 2014-03-19 NOTE — ED Notes (Signed)
MD at bedside. 

## 2014-03-19 NOTE — ED Provider Notes (Addendum)
CSN: [id number]     Arrival date & time 03/19/14  1641 History   First MD Initiated Contact with Patient 03/19/14 1714     Chief Complaint  Patient presents with  . abd pain and detox from alcohol      (Consider location/radiation/quality/duration/timing/severity/associated sxs/prior Treatment) Patient is a 43 y.o. male presenting with abdominal pain. The history is provided by the patient. No language interpreter was used.  Abdominal Pain Pain location:  Epigastric Pain quality: stabbing   Pain radiates to:  Back Pain severity:  Severe Onset quality:  Gradual Duration:  3 hours Timing:  Constant Progression:  Worsening Chronicity:  Recurrent Context: alcohol use   Relieved by:  Nothing Worsened by:  Nothing tried Associated symptoms: diarrhea, nausea and vomiting   Associated symptoms: no chest pain, no dysuria, no fever, no hematemesis, no hematochezia, no melena and no shortness of breath   Diarrhea:    Quality:  Semi-solid Vomiting:    Quality:  Stomach contents Risk factors: alcohol abuse and recent hospitalization   Risk factors: has not had multiple surgeries     Past Medical History  Diagnosis Date  . Hypertension   . Asthma   . Pancreatitis   . Cocaine abuse   . Depression   . H/O suicide attempt   . Heart murmur     "when he was little" (03/06/2013)  . Shortness of breath     "can happen at anytime" (03/06/2013)  . Anemia   . H/O hiatal hernia   . GERD (gastroesophageal reflux disease)   . Anxiety   . WPW (Wolff-Parkinson-White syndrome)     [name] 03/06/2013  . High cholesterol   . Migraine     "monthly" (01/30/2014)  . Arthritis     "knees; arms; elbows" (01/30/2014)  . Femoral condyle fracture 03/08/2014    left medial/notes 03/09/2014   Past Surgical History  Procedure Laterality Date  . Facial fracture surgery Left 1990's    "result of trauma"   . Eye surgery Left 1990's    "result of trauma"    Family History  Problem Relation Age of  Onset  . Hypertension Other   . Coronary artery disease Other    History  Substance Use Topics  . Smoking status: Current Every Day Smoker -- 0.50 packs/day for 30 years    Types: Cigarettes  . Smokeless tobacco: Current User    Types: Chew  . Alcohol Use: 28.2 oz/week    47 Cans of beer per week     Comment: 11/2//2015 "I drink 2, 40oz  beers per day"    Review of Systems  Constitutional: Positive for appetite change. Negative for fever.  Respiratory: Negative for chest tightness and shortness of breath.   Cardiovascular: Negative for chest pain.  Gastrointestinal: Positive for nausea, vomiting, abdominal pain and diarrhea. Negative for blood in stool, melena, hematochezia, abdominal distention and hematemesis.  Genitourinary: Negative for dysuria and decreased urine volume.  Musculoskeletal: Positive for back pain. Negative for gait problem.  Hematological: Does not bruise/bleed easily.  All other systems reviewed and are negative.     Allergies  Shellfish-derived products and Trazodone and nefazodone  Home Medications   Prior to Admission medications   Medication Sig Start Date End Date Taking? Authorizing Provider  albuterol (PROVENTIL HFA;VENTOLIN HFA) 108 (90 BASE) MCG/ACT inhaler Inhale 2 puffs into the lungs every 6 (six) hours as needed for wheezing or shortness of breath.   Yes Historical Provider, MD  lipase/protease/amylase (CREON) 12000  UNITS CPEP capsule Take 2 capsules (24,000 Units total) by mouth 3 (three) times daily with meals. 02/06/14  Yes [name], MD  loperamide (IMODIUM) 2 MG capsule Take 1-2 capsules (2-4 mg total) by mouth as needed for diarrhea or loose stools. 01/23/14  Yes Marianne L York, PA-C  loratadine (CLARITIN) 10 MG tablet Take 1 tablet (10 mg total) by mouth daily. 02/25/14  Yes Marianne L York, PA-C  metoprolol tartrate (LOPRESSOR) 25 MG tablet Take 1 tablet (25 mg total) by mouth 2 (two) times daily. 02/06/14  Yes [name], MD  Multiple Vitamin (MULTIVITAMIN) capsule Take 1 capsule by mouth daily. 02/06/14  Yes Olugbemiga Essie Christine, MD  oxyCODONE 10 MG TABS Take 1-1.5 tablets (10-15 mg total) by mouth every 6 (six) hours as needed for moderate pain. 03/13/14  Yes Shanker Kristeen Mans, MD  pantoprazole (PROTONIX) 40 MG tablet Take 1 tablet (40 mg total) by mouth 2 (two) times daily. 11/28/13  Yes [name], MD  promethazine (PHENERGAN) 25 MG tablet Take 1 tablet (25 mg total) by mouth every 6 (six) hours as needed for nausea or vomiting. 02/25/14  Yes Marianne L York, PA-C  QUEtiapine (SEROQUEL) 100 MG tablet Take 1 tablet (100 mg total) by mouth at bedtime. For mood control 01/15/14  Yes Shanker Kristeen Mans, MD  sertraline (ZOLOFT) 25 MG tablet Take 75 mg by mouth daily.   Yes Historical Provider, MD  thiamine 100 MG tablet Take 100 mg by mouth daily.    Yes Historical Provider, MD   BP 137/75 mmHg  Pulse 106  Temp(Src) 98.2 F (36.8 C)  Resp 21  Ht _0  (1.753 m)  Wt 145 lb (65.772 kg)  BMI 21.40 kg/m2  SpO2 98% Physical Exam  Constitutional: He is oriented to person, place, and time. He appears well-developed and well-nourished. No distress.  HENT:  Head: Normocephalic.  Mouth/Throat: Oropharynx is clear and moist.  Eyes: Conjunctivae are normal.  Cardiovascular: Normal rate, regular rhythm and intact distal pulses.   Pulmonary/Chest: Effort normal and breath sounds normal.  Abdominal: Soft. Bowel sounds are normal. He exhibits no distension. There is tenderness in the epigastric area and left upper quadrant. There is no rebound, no CVA tenderness and negative Murphy's sign. No hernia.  Neurological: He is alert and oriented to person, place, and time.  Skin: Skin is warm and dry.  Vitals reviewed.   ED Course  Procedures (including critical care time) Labs Review Labs Reviewed  CBC WITH DIFFERENTIAL - Abnormal; Notable for the following:    RBC 3.69 (*)    Hemoglobin 11.4 (*)    HCT  33.8 (*)    All other components within normal limits  COMPREHENSIVE METABOLIC PANEL - Abnormal; Notable for the following:    CO2 17 (*)    BUN <3 (*)    Creatinine, Ser 0.45 (*)    AST 41 (*)    Alkaline Phosphatase 127 (*)    Anion gap 22 (*)    All other components within normal limits  URINALYSIS, ROUTINE W REFLEX MICROSCOPIC - Abnormal; Notable for the following:    Specific Gravity, Urine 1.004 (*)    All other components within normal limits  LIPASE, BLOOD    Imaging Review No results found.   EKG Interpretation   Date/Time:  Wednesday March 19 2014 17:49:53 EST Ventricular Rate:  100 PR Interval:  108 QRS Duration: 75 QT Interval:  339 QTC Calculation: 437 R Axis:  30 Text Interpretation:  Sinus tachycardia Biatrial enlargement Abnrm T,  consider ischemia, anterolateral lds When compared with ECG of 03/10/2014,  T wave inversion in the  Anterior leads is now Present Confirmed by [facility]   MD, DAVID ([id number]) on 03/19/2014 5:58:02 PM      MDM   Final diagnoses:  None    43 y/o male with history of HTN, pancreatitis, cocaine/EtOH abuse presenting with epigastric abdominal pain similar to prior flares of pancreatitis. Also requesting detox. He endorses drinking 2 40 oz malt liquor drinks daily. No hematemesis or hematochezia/melena. No fevers. AFVSS. Well appearing on exam. Abdomen tender but not peritonitic. Labs remarkable for anemia (unchanged from prior), AST/Alk phos elevated similar to prior, AG 22, CO2 17. Lipase wnl. Pain controlled with IV meds. Will need admission for alcoholic ketoacidosis.      Amparo Bristol, MD 03/20/14 [phone]

## 2014-03-19 NOTE — ED Notes (Signed)
Pt noted to be walking out of department

## 2014-03-19 NOTE — ED Notes (Signed)
PT REQUESTING ADDL PAIN MED PROVIDER INFORMED

## 2014-03-19 NOTE — ED Notes (Signed)
The pt is c/o abd pain  He reports that he has pancreatitis from drinking alcohol.  Last alcohol was earlier today.

## 2014-03-19 NOTE — ED Notes (Signed)
PT STATES HE NEEDS TO GIVE HIS DAUGHTER HIS KEYS OFFERED TO GIVE THEM TO HER OR CALL HER BACK TO ROOM PT INSISTED ON GOING TO WAITING ROOM TO GIVE THEM TO HER

## 2014-03-19 NOTE — ED Provider Notes (Signed)
43 year old male alcoholic states that he drinks 80 ounces of. Today and had onset this morning of epigastric burning and pain. There has been some associated nausea with vomiting. He states that he wants help to go through detox. On exam, lungs are clear and heart has regular rate and rhythm. He does not appear to be in significant distress. There is moderate epigastric tenderness without rebound or guarding and bowel sounds are decreased. Laboratory workup shows low CO2 with elevated anion gap consistent with alcoholic ketoacidosis. He will need to be admitted for this.  I saw and evaluated the patient, reviewed the resident's note and I agree with the findings and plan.   EKG Interpretation   Date/Time:  Wednesday March 19 2014 17:49:53 EST Ventricular Rate:  100 PR Interval:  108 QRS Duration: 75 QT Interval:  339 QTC Calculation: 437 R Axis:   69 Text Interpretation:  Sinus tachycardia Biatrial enlargement Abnrm T,  consider ischemia, anterolateral lds When compared with ECG of 03/10/2014,  T wave inversion in the  Anterior leads is now Present Confirmed by [facility]   MD, Mollee Neer ([id number]) on 03/19/2014 5:58:02 PM        Delora Fuel, MD 84/69/62 [id number]

## 2014-03-20 ENCOUNTER — Encounter (HOSPITAL_COMMUNITY): Payer: Self-pay | Admitting: Internal Medicine

## 2014-03-20 ENCOUNTER — Inpatient Hospital Stay: Payer: Self-pay | Admitting: Internal Medicine

## 2014-03-20 DIAGNOSIS — R1110 Vomiting, unspecified: Secondary | ICD-10-CM

## 2014-03-20 DIAGNOSIS — K860 Alcohol-induced chronic pancreatitis: Secondary | ICD-10-CM

## 2014-03-20 DIAGNOSIS — Z720 Tobacco use: Secondary | ICD-10-CM

## 2014-03-20 DIAGNOSIS — R1013 Epigastric pain: Secondary | ICD-10-CM

## 2014-03-20 DIAGNOSIS — F1014 Alcohol abuse with alcohol-induced mood disorder: Secondary | ICD-10-CM

## 2014-03-20 DIAGNOSIS — I10 Essential (primary) hypertension: Secondary | ICD-10-CM

## 2014-03-20 DIAGNOSIS — E872 Acidosis: Secondary | ICD-10-CM

## 2014-03-20 LAB — RAPID URINE DRUG SCREEN, HOSP PERFORMED
Amphetamines: NOT DETECTED
BARBITURATES: POSITIVE — AB
Benzodiazepines: NOT DETECTED
Cocaine: NOT DETECTED
Opiates: NOT DETECTED
TETRAHYDROCANNABINOL: NOT DETECTED

## 2014-03-20 LAB — ETHANOL: Alcohol, Ethyl (B): <11 mg/dL (ref 0–11)

## 2014-03-20 MED ORDER — THIAMINE HCL 100 MG/ML IJ SOLN
100.0000 mg | Freq: Every day | INTRAMUSCULAR | Status: DC
  Filled 2014-03-20 (×2): qty 1

## 2014-03-20 MED ORDER — BOOST / RESOURCE BREEZE PO LIQD
1.0000 | Freq: Three times a day (TID) | ORAL | Status: DC
  Administered 2014-03-20 – 2014-03-21 (×2): 1 via ORAL

## 2014-03-20 MED ORDER — HYDROMORPHONE HCL 1 MG/ML IJ SOLN
0.5000 mg | INTRAMUSCULAR | Status: DC | PRN
  Administered 2014-03-20 (×3): 1 mg via INTRAVENOUS
  Administered 2014-03-20 – 2014-03-21 (×2): 0.5 mg via INTRAVENOUS
  Filled 2014-03-20 (×5): qty 1

## 2014-03-20 MED ORDER — ACETAMINOPHEN 325 MG PO TABS
650.0000 mg | ORAL_TABLET | Freq: Four times a day (QID) | ORAL | Status: DC | PRN
  Administered 2014-03-21: 650 mg via ORAL
  Filled 2014-03-20: qty 2

## 2014-03-20 MED ORDER — SODIUM CHLORIDE 0.9 % IJ SOLN
3.0000 mL | Freq: Two times a day (BID) | INTRAMUSCULAR | Status: DC
  Administered 2014-03-20: 3 mL via INTRAVENOUS

## 2014-03-20 MED ORDER — ONDANSETRON HCL 4 MG/2ML IJ SOLN: 4.0000 mg | Freq: Four times a day (QID) | INTRAMUSCULAR | Status: DC | PRN

## 2014-03-20 MED ORDER — CETYLPYRIDINIUM CHLORIDE 0.05 % MT LIQD
7.0000 mL | Freq: Two times a day (BID) | OROMUCOSAL | Status: DC
  Administered 2014-03-20 – 2014-03-21 (×3): 7 mL via OROMUCOSAL

## 2014-03-20 MED ORDER — SODIUM CHLORIDE 0.9 % IV BOLUS (SEPSIS)
500.0000 mL | Freq: Once | INTRAVENOUS | Status: AC
  Administered 2014-03-20: 500 mL via INTRAVENOUS

## 2014-03-20 MED ORDER — SUCRALFATE 1 GM/10ML PO SUSP
1.0000 g | Freq: Four times a day (QID) | ORAL | Status: DC
  Administered 2014-03-20 – 2014-03-21 (×5): 1 g via ORAL
  Filled 2014-03-20 (×8): qty 10

## 2014-03-20 MED ORDER — SODIUM CHLORIDE 0.9 % IV SOLN
INTRAVENOUS | Status: DC
  Administered 2014-03-20 – 2014-03-21 (×4): via INTRAVENOUS

## 2014-03-20 MED ORDER — ALBUTEROL SULFATE (2.5 MG/3ML) 0.083% IN NEBU: 2.5000 mg | INHALATION_SOLUTION | Freq: Four times a day (QID) | RESPIRATORY_TRACT | Status: DC | PRN

## 2014-03-20 MED ORDER — OXYCODONE HCL 5 MG PO TABS
5.0000 mg | ORAL_TABLET | ORAL | Status: DC | PRN
  Administered 2014-03-20 – 2014-03-21 (×2): 5 mg via ORAL
  Filled 2014-03-20 (×2): qty 1

## 2014-03-20 MED ORDER — ADULT MULTIVITAMIN W/MINERALS CH
1.0000 | ORAL_TABLET | Freq: Every day | ORAL | Status: DC
  Administered 2014-03-20 – 2014-03-21 (×2): 1 via ORAL
  Filled 2014-03-20 (×2): qty 1

## 2014-03-20 MED ORDER — ALUM & MAG HYDROXIDE-SIMETH 200-200-20 MG/5ML PO SUSP: 30.0000 mL | Freq: Four times a day (QID) | ORAL | Status: DC | PRN

## 2014-03-20 MED ORDER — LORAZEPAM 2 MG/ML IJ SOLN
0.0000 mg | Freq: Two times a day (BID) | INTRAMUSCULAR | Status: DC
  Filled 2014-03-20: qty 1

## 2014-03-20 MED ORDER — LORAZEPAM 2 MG/ML IJ SOLN
0.0000 mg | Freq: Four times a day (QID) | INTRAMUSCULAR | Status: DC
  Administered 2014-03-20 (×2): 2 mg via INTRAVENOUS
  Administered 2014-03-20: 1 mg via INTRAVENOUS
  Administered 2014-03-20: 2 mg via INTRAVENOUS
  Administered 2014-03-21 (×2): 1 mg via INTRAVENOUS
  Filled 2014-03-20 (×5): qty 1

## 2014-03-20 MED ORDER — LORAZEPAM 2 MG/ML IJ SOLN
1.0000 mg | Freq: Four times a day (QID) | INTRAMUSCULAR | Status: DC | PRN
  Administered 2014-03-20 (×2): 1 mg via INTRAVENOUS
  Filled 2014-03-20 (×2): qty 1

## 2014-03-20 MED ORDER — ACETAMINOPHEN 650 MG RE SUPP: 650.0000 mg | Freq: Four times a day (QID) | RECTAL | Status: DC | PRN

## 2014-03-20 MED ORDER — VITAMIN B-1 100 MG PO TABS
100.0000 mg | ORAL_TABLET | Freq: Every day | ORAL | Status: DC
  Administered 2014-03-20 – 2014-03-21 (×2): 100 mg via ORAL
  Filled 2014-03-20 (×2): qty 1

## 2014-03-20 MED ORDER — PANTOPRAZOLE SODIUM 40 MG IV SOLR
40.0000 mg | Freq: Two times a day (BID) | INTRAVENOUS | Status: DC
  Administered 2014-03-20 – 2014-03-21 (×3): 40 mg via INTRAVENOUS
  Filled 2014-03-20 (×4): qty 40

## 2014-03-20 MED ORDER — ENOXAPARIN SODIUM 30 MG/0.3ML ~~LOC~~ SOLN
30.0000 mg | SUBCUTANEOUS | Status: DC
  Administered 2014-03-20 – 2014-03-21 (×2): 30 mg via SUBCUTANEOUS
  Filled 2014-03-20 (×2): qty 0.3

## 2014-03-20 MED ORDER — SODIUM CHLORIDE 0.9 % IV SOLN: INTRAVENOUS | Status: DC

## 2014-03-20 MED ORDER — LORAZEPAM 1 MG PO TABS: 1.0000 mg | ORAL_TABLET | Freq: Four times a day (QID) | ORAL | Status: DC | PRN

## 2014-03-20 MED ORDER — ONDANSETRON HCL 4 MG PO TABS: 4.0000 mg | ORAL_TABLET | Freq: Four times a day (QID) | ORAL | Status: DC | PRN

## 2014-03-20 MED ORDER — FOLIC ACID 1 MG PO TABS
1.0000 mg | ORAL_TABLET | Freq: Every day | ORAL | Status: DC
  Administered 2014-03-20 – 2014-03-21 (×2): 1 mg via ORAL
  Filled 2014-03-20 (×2): qty 1

## 2014-03-20 NOTE — H&P (Signed)
[facility] Admission History and Physical       [name] NFA:[id number] DOB: 1970/05/18 DOA: 03/19/2014  Referring physician: EDP PCP: Angelica Chessman, MD  Specialists:   Chief Complaint: Epigastric ABD Pain  HPI: [name] is a 43 y.o. male with a history of Alcoholism, Chronic Pancreatitis, HTN, Asthma who presents to the ED with complaints of severe epigastric Pain with nausea and vomiting and diarrhea that started at 3 pm.   He drinks alcohol daily and reports last drinking a 40 ounce beer at 4:30 pm.   He denies any fevers or chills.    He presented to the ED due to increased ABD Pain and reported that he wants to stop drinking.      Review of Systems:  Constitutional: No Weight Loss, No Weight Gain, Night Sweats, Fevers, Chills, Dizziness, Fatigue, or Generalized Weakness HEENT: No Headaches, Difficulty Swallowing,Tooth/Dental Problems,Sore Throat,  No Sneezing, Rhinitis, Ear Ache, Nasal Congestion, or Post Nasal Drip,  Cardio-vascular:  No Chest pain, Orthopnea, PND, Edema in Lower Extremities, Anasarca, Dizziness, Palpitations  Resp: No Dyspnea, No DOE, No Productive Cough, No Non-Productive Cough, No Hemoptysis, No Wheezing.    GI: +Heartburn, No Indigestion, +Abdominal Pain, +Nausea, +Vomiting, +Diarrhea, No Hematemesis, Hematochezia, Melena, Change in Bowel Habits, Loss of Appetite  GU: No Dysuria, Change in Color of Urine, No Urgency or Frequency, No Flank pain.  Musculoskeletal: No Joint Pain or Swelling, No Decreased Range of Motion, No Back Pain.  Neurologic: No Syncope, No Seizures, Muscle Weakness, Paresthesia, Vision Disturbance or Loss, No Diplopia, No Vertigo, No Difficulty Walking,  Skin: No Rash or Lesions. Psych: No Change in Mood or Affect, No Depression or Anxiety, No Memory loss, No Confusion, or Hallucinations   Past Medical History  Diagnosis Date  . Hypertension   . Asthma   . Pancreatitis   . Cocaine abuse   . Depression     . H/O suicide attempt   . Heart murmur     "when he was little" (03/06/2013)  . Shortness of breath     "can happen at anytime" (03/06/2013)  . Anemia   . H/O hiatal hernia   . GERD (gastroesophageal reflux disease)   . Anxiety   . WPW (Wolff-Parkinson-White syndrome)     [name] 03/06/2013  . High cholesterol   . Migraine     "monthly" (01/30/2014)  . Arthritis     "knees; arms; elbows" (01/30/2014)  . Femoral condyle fracture 03/08/2014    left medial/notes 03/09/2014  . Alcoholism /alcohol abuse     Past Surgical History  Procedure Laterality Date  . Facial fracture surgery Left 1990's    "result of trauma"   . Eye surgery Left 1990's    "result of trauma"      Prior to Admission medications   Medication Sig Start Date End Date Taking? Authorizing Provider  albuterol (PROVENTIL HFA;VENTOLIN HFA) 108 (90 BASE) MCG/ACT inhaler Inhale 2 puffs into the lungs every 6 (six) hours as needed for wheezing or shortness of breath.   Yes Historical Provider, MD  lipase/protease/amylase (CREON) 12000 UNITS CPEP capsule Take 2 capsules (24,000 Units total) by mouth 3 (three) times daily with meals. 02/06/14  Yes [name], MD  loperamide (IMODIUM) 2 MG capsule Take 1-2 capsules (2-4 mg total) by mouth as needed for diarrhea or loose stools. 01/23/14  Yes Marianne L York, PA-C  loratadine (CLARITIN) 10 MG tablet Take 1 tablet (10 mg total) by mouth daily. 02/25/14  Yes  [name], PA-C  metoprolol tartrate (LOPRESSOR) 25 MG tablet Take 1 tablet (25 mg total) by mouth 2 (two) times daily. 02/06/14  Yes [name], MD  Multiple Vitamin (MULTIVITAMIN) capsule Take 1 capsule by mouth daily. 02/06/14  Yes Olugbemiga Essie Christine, MD  oxyCODONE 10 MG TABS Take 1-1.5 tablets (10-15 mg total) by mouth every 6 (six) hours as needed for moderate pain. 03/13/14  Yes Shanker Kristeen Mans, MD  pantoprazole (PROTONIX) 40 MG tablet Take 1 tablet (40 mg total) by mouth 2 (two) times daily.  11/28/13  Yes [name], MD  promethazine (PHENERGAN) 25 MG tablet Take 1 tablet (25 mg total) by mouth every 6 (six) hours as needed for nausea or vomiting. 02/25/14  Yes Marianne L York, PA-C  QUEtiapine (SEROQUEL) 100 MG tablet Take 1 tablet (100 mg total) by mouth at bedtime. For mood control 01/15/14  Yes Shanker Kristeen Mans, MD  sertraline (ZOLOFT) 25 MG tablet Take 75 mg by mouth daily.   Yes Historical Provider, MD  thiamine 100 MG tablet Take 100 mg by mouth daily.    Yes Historical Provider, MD     Allergies  Allergen Reactions  . Shellfish-Derived Products Nausea And Vomiting  . Trazodone And Nefazodone Other (See Comments)    Muscle spasms    Social History:  reports that he has been smoking Cigarettes.  He has a 15 pack-year smoking history. His smokeless tobacco use includes Chew. He reports that he drinks about 28.2 oz of alcohol per week. He reports that he uses illicit drugs (Cocaine and Marijuana).     Family History  Problem Relation Age of Onset  . Hypertension Other   . Coronary artery disease Other        Physical Exam:  GEN:  Pleasant Thin  43 y.o. African American male examined and in discomfort but no acute distress; cooperative with exam Filed Vitals:   03/20/14 0015 03/20/14 0030 03/20/14 0045 03/20/14 0145  BP: 129/80 144/89 129/85 116/78  Pulse: 99 92 95 97  Temp:    97.7 F (36.5 C)  TempSrc:    Oral  Resp: 11 16 20 18   Height:    5\' 9"  (1.753 m)  Weight:    65.908 kg (145 lb 4.8 oz)  SpO2: 99% 100% 100% 99%   Blood pressure 116/78, pulse 97, temperature 97.7 F (36.5 C), temperature source Oral, resp. rate 18, height 5\' 9"  (1.753 m), weight 65.908 kg (145 lb 4.8 oz), SpO2 99 %. PSYCH: He is alert and oriented x4; does not appear anxious does not appear depressed; affect is normal HEENT: Normocephalic and Atraumatic, Mucous membranes pink; PERRLA; EOM intact; Fundi:  Benign;  No scleral icterus, Nares: Patent, Oropharynx: Clear, Fair  Dentition,    Neck:  FROM, No Cervical Lymphadenopathy nor Thyromegaly or Carotid Bruit; No JVD; Breasts:: Not examined CHEST WALL: No tenderness CHEST: Normal respiration, clear to auscultation bilaterally HEART: Regular rate and rhythm; no murmurs rubs or gallops BACK: No kyphosis or scoliosis; No CVA tenderness ABDOMEN: Positive Bowel Sounds, Scaphoid, Soft Mildly Tender in the Epigastrium, No Rebound, No Guarding, No Masses, No Organomegaly. Rectal Exam: Not done EXTREMITIES: No Cyanosis, Clubbing, or Edema; No Ulcerations. Genitalia: not examined PULSES: 2+ and symmetric SKIN: Normal hydration no rash or ulceration CNS:  Alert and Oriented x 4, No Focal Deficits Vascular: pulses palpable throughout    Labs on Admission:  Basic Metabolic Panel:  Recent Labs Lab 03/17/14 2052 03/19/14 1750  NA  132* 137  K 3.7 3.9  CL 91* 98  CO2 16* 17*  GLUCOSE 65* 74  BUN 7 <3*  CREATININE 0.56 0.45*  CALCIUM 9.4 9.1   Liver Function Tests:  Recent Labs Lab 03/17/14 2052 03/19/14 1750  AST 49* 41*  ALT 45 32  ALKPHOS 157* 127*  BILITOT 0.4 0.3  PROT 8.8* 7.7  ALBUMIN 4.1 3.6    Recent Labs Lab 03/17/14 2052 03/19/14 1750  LIPASE 42 37   No results for input(s): AMMONIA in the last 168 hours. CBC:  Recent Labs Lab 03/17/14 2052 03/19/14 1750  WBC 10.7* 8.6  NEUTROABS 6.6 5.1  HGB 12.1* 11.4*  HCT 35.1* 33.8*  MCV 90.5 91.6  PLT 252 247   Cardiac Enzymes: No results for input(s): CKTOTAL, CKMB, CKMBINDEX, TROPONINI in the last 168 hours.  BNP (last 3 results) No results for input(s): PROBNP in the last 8760 hours. CBG: No results for input(s): GLUCAP in the last 168 hours.  Radiological Exams on Admission: No results found.   EKG: Independently reviewed.  Sinus Tachycardia at rate of 100   Assessment/Plan:   43 y.o. male with   Principal Problem:   1.   Alcoholic pancreatitis   Pain Control PrN   Anti-emetics PRN   Clear Liquids  Diet     Active Problems:   2.   Metabolic acidosis   IVFs   Monitor Electrolytes and Anion Gap     3.   Nausea with vomiting   Anti-Emetics PRN     4.   Abdominal pain, epigastric- #! And Probable Esophagitis or Gastritis   Pain control  PRN   IV Protonix   Carafate PO QID     5.   Alcohol abuse with alcohol-induced mood disorder   CIWA Protocol   Refer for ETOH Rehab Rx     6.   Essential hypertension   Continue      7.   TOBACCO ABUSE   Nicotine Patch daily     8.   [name] Parkinson White pattern seen on electrocardiogram   Monitor on Telemetry     9.   DVT  Prophylaxis   Lovenox      Code Status:   FULL CODE     Family Communication:    No Family Present Disposition Plan:   Inpatient  Time spent:  [place] [facility] Pager [phone]   If [place] Please Contact the Day Rounding Team MD for [facility]  If 7PM-7AM, Please Contact Night-Floor Coverage  [email or web address] Password [email or web address] 03/20/2014, 1:58 AM

## 2014-03-20 NOTE — Progress Notes (Signed)
INITIAL NUTRITION ASSESSMENT  Pt meets criteria for NON-SEVERE (MODERATE) MALNUTRITION in the context of chronic illness as evidenced by mild fat mass loss and moderate muscle mass loss.  DOCUMENTATION CODES Per approved criteria  -Non-severe (moderate) malnutrition in the context of chronic illness   INTERVENTION: Provide Resource Breeze po TID, each supplement provides 250 kcal and 9 grams of protein.  Encourage adequate PO intake.  NUTRITION DIAGNOSIS: Inadequate oral intake related to abdominal pains, nausea as evidenced by pt report.   Goal: Pt to meet >/= 90% of their estimated nutrition needs   Monitor:  PO intake, weight trends, labs, I/O's  Reason for Assessment: MST  43 y.o. male  Admitting Dx: Alcoholic pancreatitis  ASSESSMENT: Pt with a history of Alcoholism, Chronic Pancreatitis, HTN, Asthma who presents to the ED with complaints of severe epigastric Pain with nausea and vomiting and diarrhea that started at 3 pm.He drinks alcohol daily and reports last drinking a 40 ounce beer at 4:30 pm. He denies any fevers or chills. He presented to the ED due to increased ABD Pain and reported that he wants to stop drinking.  Pt reports having a decreased appetite, which has been ongoing over the past 3 years, however just over the past couple of days he has not been able to eat much solids. He reports when he eats solid foods, such as chicken and potato chips which he ate right before admission, it made his stomach burn. Pt reports he has been losing weight due to not eating well, however has been able to gain weight recently. Pt is on a clear liquids diet and meal completion is 100%. Pt is agreeable to [employer]. Will order. Pt was encouraged to continue adequate consumption at meals and to drink his supplements.   Nutrition Focused Physical Exam:  Subcutaneous Fat:  Orbital Region: N/A Upper Arm Region: Mild depletion Thoracic and Lumbar Region: N/A  Muscle:   Temple Region: Moderate depletion Clavicle Bone Region: Moderate depletion Clavicle and Acromion Bone Region: Moderate depletion Scapular Bone Region: N/A Dorsal Hand: Moderate depletion Patellar Region: Moderate depletion Anterior Thigh Region: Moderate depletion Posterior Calf Region: Moderate depletion  Edema: none  Labs: CO2, BUN, and creatinine.  High AST and alkaline phosphatase.  Height: Ht Readings from Last 1 Encounters:  03/20/14 5\' 9"  (1.753 m)    Weight: Wt Readings from Last 1 Encounters:  03/20/14 145 lb 4.8 oz (65.908 kg)    Ideal Body Weight: 160 lbs  % Ideal Body Weight: 91%  Wt Readings from Last 10 Encounters:  03/20/14 145 lb 4.8 oz (65.908 kg)  03/17/14 140 lb (63.504 kg)  03/12/14 132 lb 11.2 oz (60.192 kg)  03/10/14 132 lb (59.875 kg)  02/27/14 131 lb 3.2 oz (59.512 kg)  02/23/14 136 lb 6.4 oz (61.871 kg)  02/10/14 145 lb (65.772 kg)  02/06/14 130 lb (58.968 kg)  02/02/14 134 lb 8 oz (61.009 kg)  01/09/14 140 lb (63.504 kg)    Usual Body Weight: 140 lbs  % Usual Body Weight: 104%  BMI:  Body mass index is 21.45 kg/(m^2).  Estimated Nutritional Needs: Kcal: 7681-1572 Protein: 85-95 grams Fluid: 1.9 - 2.15 L/day  Skin: intact  Diet Order: Diet clear liquid  EDUCATION NEEDS: -No education needs identified at this time   Intake/Output Summary (Last 24 hours) at 03/20/14 0926 Last data filed at 03/20/14 0045  Gross per 24 hour  Intake   1000 ml  Output      0 ml  Net   1000 ml    Last BM: 11/12  Labs:   Recent Labs Lab 03/17/14 2052 03/19/14 1750  NA 132* 137  K 3.7 3.9  CL 91* 98  CO2 16* 17*  BUN 7 <3*  CREATININE 0.56 0.45*  CALCIUM 9.4 9.1  GLUCOSE 65* 74    CBG (last 3)  No results for input(s): GLUCAP in the last 72 hours.  Scheduled Meds: . antiseptic oral rinse  7 mL Mouth Rinse BID  . enoxaparin (LOVENOX) injection  30 mg Subcutaneous Q24H  . folic acid  1 mg Oral Daily  . LORazepam  0-4 mg  Intravenous Q6H   Followed by  . [START ON 03/22/2014] LORazepam  0-4 mg Intravenous Q12H  . metoprolol tartrate  25 mg Oral BID  . multivitamin with minerals  1 tablet Oral Daily  . nicotine  21 mg Transdermal Daily  . pantoprazole (PROTONIX) IV  40 mg Intravenous Q12H  . QUEtiapine  100 mg Oral QHS  . sodium chloride  3 mL Intravenous Q12H  . sucralfate  1 g Oral 4 times per day  . thiamine  100 mg Oral Daily   Or  . thiamine  100 mg Intravenous Daily    Continuous Infusions: . sodium chloride 100 mL/hr at 03/20/14 2919    Past Medical History  Diagnosis Date  . Hypertension   . Asthma   . Pancreatitis   . Cocaine abuse   . Depression   . H/O suicide attempt   . Heart murmur     "when he was little" (03/06/2013)  . Shortness of breath     "can happen at anytime" (03/06/2013)  . Anemia   . H/O hiatal hernia   . GERD (gastroesophageal reflux disease)   . Anxiety   . WPW (Wolff-Parkinson-White syndrome)     [name] 03/06/2013  . High cholesterol   . Migraine     "monthly" (01/30/2014)  . Arthritis     "knees; arms; elbows" (01/30/2014)  . Femoral condyle fracture 03/08/2014    left medial/notes 03/09/2014  . Alcoholism /alcohol abuse     Past Surgical History  Procedure Laterality Date  . Facial fracture surgery Left 1990's    "result of trauma"   . Eye surgery Left 1990's    "result of trauma"     Kallie Locks, MS, RD, LDN Pager # [phone] After hours/ weekend pager # [phone]

## 2014-03-20 NOTE — Progress Notes (Signed)
Patient Demographics  [name], is a 43 y.o. male, DOB - April 18, 1971, PNT:[id number]  Admit date - 03/19/2014   Admitting Physician Theressa Millard, MD  Outpatient Primary MD for the patient is Angelica Chessman, MD  LOS - 1   Chief Complaint  Patient presents with  . abd pain and detox from alcohol       Brief narrative: [name] is a 43 y.o. male with a history of Alcoholism, Chronic Pancreatitis, HTN, Asthma who presents to the ED with complaints of severe epigastric Pain with nausea and vomiting and diarrhea that started at 3 pm. He drinks alcohol daily and reports last drinking a 40 ounce beer at 4:30 pm. He denies any fevers or chills. He presented to the ED due to increased ABD Pain and reported that he wants to stop drinking.   Subjective:   [name] today has, No headache, No chest pain, Complains of abdominal pain - No Nausea, No new weakness tingling or numbness, No Cough - SOB.   Assessment & Plan    Principal Problem:   Alcoholic pancreatitis Active Problems:   TOBACCO ABUSE   [name] Parkinson White pattern seen on electrocardiogram   Alcohol abuse with alcohol-induced mood disorder   Essential hypertension   Nausea with vomiting   Metabolic acidosis   Abdominal pain, epigastric   Malnutrition of moderate degree  Principal Problem:  1. Alcoholic pancreatitis Pain Control PrN Anti-emetics PRN advance diet  Active Problems:  2. Metabolic acidosis IVFs Monitor Electrolytes and Anion Gap    3. Nausea with vomiting Anti-Emetics PRN    4. Abdominal pain, epigastric- #! And Probable Esophagitis or  Gastritis Pain control PRN IV Protonix Carafate PO QID    5. Alcohol abuse with alcohol-induced mood disorder CIWA Protocol Refer for ETOH Rehab Rx    6. Essential hypertension Continue homr mrdication    7. TOBACCO ABUSE Nicotine Patch daily    8. [name] Parkinson White pattern seen on electrocardiogram Monitor on Telemetry    9. DVT Prophylaxis Lovenox  Code Status: Full  Family Communication: patient is alert and oriented  Disposition Plan: home when stable.   Procedures  none   Consults   none   Medications  Scheduled Meds: . antiseptic oral rinse  7 mL Mouth Rinse BID  . enoxaparin (LOVENOX) injection  30 mg Subcutaneous Q24H  . feeding supplement (RESOURCE BREEZE)  1 Container Oral TID BM  . folic acid  1 mg Oral Daily  . LORazepam  0-4 mg Intravenous Q6H   Followed by  . [START ON 03/22/2014] LORazepam  0-4 mg Intravenous Q12H  . metoprolol tartrate  25 mg Oral BID  . multivitamin with minerals  1 tablet Oral Daily  . nicotine  21 mg Transdermal Daily  . pantoprazole (PROTONIX) IV  40 mg Intravenous Q12H  . QUEtiapine  100 mg Oral QHS  . sodium chloride  3 mL Intravenous Q12H  . sucralfate  1 g Oral 4 times per day  . thiamine  100 mg Oral Daily   Or  . thiamine  100 mg Intravenous Daily   Continuous Infusions: . sodium chloride 100 mL/hr at 03/20/14 1229   PRN Meds:.acetaminophen **OR** acetaminophen, alum & mag hydroxide-simeth, HYDROmorphone (DILAUDID)  injection, LORazepam **OR** LORazepam, ondansetron **OR** ondansetron (ZOFRAN) IV, oxyCODONE  DVT Prophylaxis  Lovenox -  Lab Results  Component Value Date   PLT 247 03/19/2014    Antibiotics    Anti-infectives    None          Objective:   Filed  Vitals:   03/20/14 0800 03/20/14 1000 03/20/14 1419 03/20/14 1720  BP:  101/56  132/66  Pulse: 87 82 88 82  Temp:  98 F (36.7 C)  98.2 F (36.8 C)  TempSrc:  Oral  Oral  Resp:  18  18  Height:      Weight:      SpO2:  99%  98%    Wt Readings from Last 3 Encounters:  03/20/14 65.908 kg (145 lb 4.8 oz)  03/17/14 63.504 kg (140 lb)  03/12/14 60.192 kg (132 lb 11.2 oz)     Intake/Output Summary (Last 24 hours) at 03/20/14 1814 Last data filed at 03/20/14 1700  Gross per 24 hour  Intake   1120 ml  Output    300 ml  Net    820 ml     Physical Exam  Awake Alert, Oriented X 3, No new F.N deficits, Normal affect [place].AT,PERRAL Supple Neck,No JVD, No cervical lymphadenopathy appriciated.  Symmetrical Chest wall movement, Good air movement bilaterally, CTAB RRR,No Gallops,Rubs or new Murmurs, No Parasternal Heave +ve B.Sounds, Abd Soft, No tenderness, No organomegaly appriciated, No rebound - guarding or rigidity. No Cyanosis, Clubbing or edema, No new Rash or bruise     Data Review   Micro Results No results found for this or any previous visit (from the past 240 hour(s)).  Radiology Reports No results found.  CBC  Recent Labs Lab 03/17/14 2052 03/19/14 1750  WBC 10.7* 8.6  HGB 12.1* 11.4*  HCT 35.1* 33.8*  PLT 252 247  MCV 90.5 91.6  MCH 31.2 30.9  MCHC 34.5 33.7  RDW 15.2 15.4  LYMPHSABS 3.4 2.6  MONOABS 0.6 0.7  EOSABS 0.2 0.2  BASOSABS 0.0 0.0    Chemistries   Recent Labs Lab 03/17/14 2052 03/19/14 1750  NA 132* 137  K 3.7 3.9  CL 91* 98  CO2 16* 17*  GLUCOSE 65* 74  BUN 7 <3*  CREATININE 0.56 0.45*  CALCIUM 9.4 9.1  AST 49* 41*  ALT 45 32  ALKPHOS 157* 127*  BILITOT 0.4 0.3   ------------------------------------------------------------------------------------------------------------------ estimated creatinine clearance is 109.8 mL/min (by C-G formula based on Cr of  0.45). ------------------------------------------------------------------------------------------------------------------ No results for input(s): HGBA1C in the last 72 hours. ------------------------------------------------------------------------------------------------------------------ No results for input(s): CHOL, HDL, LDLCALC, TRIG, CHOLHDL, LDLDIRECT in the last 72 hours. ------------------------------------------------------------------------------------------------------------------ No results for input(s): TSH, T4TOTAL, T3FREE, THYROIDAB in the last 72 hours.  Invalid input(s): FREET3 ------------------------------------------------------------------------------------------------------------------ No results for input(s): VITAMINB12, FOLATE, FERRITIN, TIBC, IRON, RETICCTPCT in the last 72 hours.  Coagulation profile No results for input(s): INR, PROTIME in the last 168 hours.  No results for input(s): DDIMER in the last 72 hours.  Cardiac Enzymes No results for input(s): CKMB, TROPONINI, MYOGLOBIN in the last 168 hours.  Invalid input(s): CK ------------------------------------------------------------------------------------------------------------------ Invalid input(s): POCBNP     Time Spent in minutes   25 minutes   Dequavius Kuhner M.D on 03/20/2014 at 6:14 PM  Between 7am to 7pm - Pager - [phone]  After 7pm go to [email or web address] - password [email or web address]  And look for the night coverage person covering for me after hours  [facility] Office  [phone]   **  Disclaimer: This note may have been dictated with voice recognition software. Similar sounding words can inadvertently be transcribed and this note may contain transcription errors which may not have been corrected upon publication of note.**

## 2014-03-20 NOTE — Plan of Care (Signed)
Problem: Phase I Progression Outcomes Goal: Initial discharge plan identified Outcome: Completed/Met Date Met:  03/20/14  Problem: Phase II Progression Outcomes Goal: Progress activity as tolerated unless otherwise ordered Outcome: Completed/Met Date Met:  03/20/14

## 2014-03-20 NOTE — Plan of Care (Signed)
Problem: Phase I Progression Outcomes Goal: Pain controlled with appropriate interventions Outcome: Completed/Met Date Met:  03/20/14 Goal: OOB as tolerated unless otherwise ordered Outcome: Completed/Met Date Met:  03/20/14 Goal: Voiding-avoid urinary catheter unless indicated Outcome: Completed/Met Date Met:  03/20/14 Goal: Hemodynamically stable Outcome: Completed/Met Date Met:  03/20/14

## 2014-03-20 NOTE — Progress Notes (Signed)
Utilization review completed. Aislyn Hayse, RN, BSN. 

## 2014-03-20 NOTE — Progress Notes (Signed)
Pt was explained the importance of needing the bed alarm on and he refused it. Stating he is not going to fall.

## 2014-03-20 NOTE — Progress Notes (Signed)
New Admission Note:   Arrival Method: via stretcher Mental Orientation: Alert & Oriented x3 Telemetry: ST Assessment: Completed Skin: INTACT IV: NS @100  Pain: 6 OUT 10 Tubes: NONE Safety Measures: Safety Fall Prevention Plan has been given, discussed and signed Admission: Completed [address] [place] Orientation: Patient has been orientated to the room, unit and staff.  Family: NONE PRESENT  Orders have been reviewed and implemented. Will continue to monitor the patient. Call light has been placed within reach and bed alarm has been activated.   Dudley Major BSN, Consulting civil engineer number: [phone]

## 2014-03-21 ENCOUNTER — Telehealth: Payer: Self-pay | Admitting: Emergency Medicine

## 2014-03-21 LAB — COMPREHENSIVE METABOLIC PANEL
ALT: 27 U/L (ref 0–53)
ANION GAP: 12 (ref 5–15)
AST: 32 U/L (ref 0–37)
Albumin: 2.7 g/dL — ABNORMAL LOW (ref 3.5–5.2)
Alkaline Phosphatase: 96 U/L (ref 39–117)
BILIRUBIN TOTAL: 0.3 mg/dL (ref 0.3–1.2)
CALCIUM: 8.7 mg/dL (ref 8.4–10.5)
CHLORIDE: 109 meq/L (ref 96–112)
CO2: 23 meq/L (ref 19–32)
Creatinine, Ser: 0.55 mg/dL (ref 0.50–1.35)
GFR calc non Af Amer: >90 mL/min (ref >90)
Glucose, Bld: 90 mg/dL (ref 70–99)
Potassium: 4.3 mEq/L (ref 3.7–5.3)
Sodium: 144 mEq/L (ref 137–147)
Total Protein: 5.8 g/dL — ABNORMAL LOW (ref 6.0–8.3)

## 2014-03-21 LAB — CBC
HCT: 32.8 % — ABNORMAL LOW (ref 39.0–52.0)
Hemoglobin: 10.9 g/dL — ABNORMAL LOW (ref 13.0–17.0)
MCH: 31.2 pg (ref 26.0–34.0)
MCHC: 33.2 g/dL (ref 30.0–36.0)
MCV: 94 fL (ref 78.0–100.0)
Platelets: 232 10*3/uL (ref 150–400)
RBC: 3.49 MIL/uL — AB (ref 4.22–5.81)
RDW: 16.2 % — AB (ref 11.5–15.5)
WBC: 6.9 10*3/uL (ref 4.0–10.5)

## 2014-03-21 LAB — MAGNESIUM: MAGNESIUM: 1.4 mg/dL — AB (ref 1.5–2.5)

## 2014-03-21 LAB — LIPASE, BLOOD: Lipase: 24 U/L (ref 11–59)

## 2014-03-21 LAB — PHOSPHORUS: PHOSPHORUS: 3.5 mg/dL (ref 2.3–4.6)

## 2014-03-21 MED ORDER — OXYCODONE HCL 5 MG PO TABS: 5.0000 mg | ORAL_TABLET | ORAL | Status: DC | PRN

## 2014-03-21 MED ORDER — LORAZEPAM 0.5 MG PO TABS: 0.5000 mg | ORAL_TABLET | Freq: Three times a day (TID) | ORAL | Status: DC | PRN

## 2014-03-21 MED ORDER — MAGNESIUM SULFATE 2 GM/50ML IV SOLN
2.0000 g | Freq: Once | INTRAVENOUS | Status: AC
  Administered 2014-03-21: 2 g via INTRAVENOUS
  Filled 2014-03-21: qty 50

## 2014-03-21 MED ORDER — FOLIC ACID 1 MG PO TABS: 1.0000 mg | ORAL_TABLET | Freq: Every day | ORAL | Status: DC

## 2014-03-21 MED ORDER — SUCRALFATE 1 GM/10ML PO SUSP: 1.0000 g | Freq: Two times a day (BID) | ORAL | Status: DC

## 2014-03-21 NOTE — Discharge Instructions (Signed)
Follow with Primary MD Angelica Chessman, MD in 7 days   Get CBC, CMP, 2 view Chest X ray checked  by Primary MD next visit.    Activity: As tolerated with Full fall precautions use walker/cane & assistance as needed   Disposition Home    Diet: Heart Healthy , with feeding assistance and aspiration precautions as needed.  For Heart failure patients - Check your Weight same time everyday, if you gain over 2 pounds, or you develop in leg swelling, experience more shortness of breath or chest pain, call your Primary MD immediately. Follow Cardiac Low Salt Diet and 1.8 lit/day fluid restriction.   On your next visit with your primary care physician please Get Medicines reviewed and adjusted.   Please request your Prim.MD to go over all Hospital Tests and Procedure/Radiological results at the follow up, please get all Hospital records sent to your Prim MD by signing hospital release before you go home.   If you experience worsening of your admission symptoms, develop shortness of breath, life threatening emergency, suicidal or homicidal thoughts you must seek medical attention immediately by calling 911 or calling your MD immediately  if symptoms less severe.  You Must read complete instructions/literature along with all the possible adverse reactions/side effects for all the Medicines you take and that have been prescribed to you. Take any new Medicines after you have completely understood and accpet all the possible adverse reactions/side effects.   Do not drive, operating heavy machinery, perform activities at heights, swimming or participation in water activities or provide baby sitting services if your were admitted for syncope or siezures until you have seen by Primary MD or a Neurologist and advised to do so again.  Do not drive when taking Pain medications.    Do not take more than prescribed Pain, Sleep and Anxiety Medications  Special Instructions: If you have smoked or chewed  Tobacco  in the last 2 yrs please stop smoking, stop any regular Alcohol  and or any Recreational drug use.  Wear Seat belts while driving.   Please note  You were cared for by a hospitalist during your hospital stay. If you have any questions about your discharge medications or the care you received while you were in the hospital after you are discharged, you can call the unit and asked to speak with the hospitalist on call if the hospitalist that took care of you is not available. Once you are discharged, your primary care physician will handle any further medical issues. Please note that NO REFILLS for any discharge medications will be authorized once you are discharged, as it is imperative that you return to your primary care physician (or establish a relationship with a primary care physician if you do not have one) for your aftercare needs so that they can reassess your need for medications and monitor your lab values.

## 2014-03-21 NOTE — Telephone Encounter (Signed)
Pt probationer officer, Pennie Rushing called in regards to pt. Pt was admitted to [facility] floor yesterday Tamela called in to for outpt Detox facilities for pt before hospital d/c. Transferred to [place] for assistance

## 2014-03-21 NOTE — Progress Notes (Signed)
Patient discharge teaching given, including activity, diet, follow-up appoints, and medications. Patient verbalized understanding of all discharge instructions. IV access was d/c'd. Vitals are stable. Skin is intact except as charted in most recent assessments.  Pt. Home meds released to patient.  Patient picked up by his probation officer, to be taken in to custody.  Jillyn Ledger, MBA, BS, RN
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
## 2014-03-26 ENCOUNTER — Telehealth: Payer: Self-pay

## 2014-03-26 NOTE — Telephone Encounter (Signed)
Placed call to patient for post-discharge follow-up.  Patient recently hospitalized 81/38/87-19/[id number]/74 for alcoholic pancreatitis. Hospital follow-up appointment needed. Unable to reach patient; left voicemail requesting return call.

## 2014-03-28 ENCOUNTER — Telehealth: Payer: Self-pay

## 2014-03-28 NOTE — Telephone Encounter (Signed)
Placed additional call to patient for post-discharge follow-up. Patient recently hospitalized 26/20/35-59/74/16 for alcoholic pancreatitis. Hospital follow-up appointment needed. Unable to reach patient; left voicemail requesting return call.

## 2014-04-01 ENCOUNTER — Inpatient Hospital Stay: Payer: Self-pay | Admitting: Internal Medicine

## 2014-04-01 ENCOUNTER — Other Ambulatory Visit: Payer: Self-pay

## 2014-04-17 ENCOUNTER — Encounter (HOSPITAL_COMMUNITY): Payer: Self-pay | Admitting: Cardiovascular Disease
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
## 2014-05-19 ENCOUNTER — Telehealth: Payer: Self-pay | Admitting: *Deleted

## 2014-05-19 NOTE — Telephone Encounter (Signed)
Pt states that prescriptions written for him 03/19/14 were never filled due to his untimely incarceration.  Asks what to do to get prescriptions filled.  NCM advised pt to visit [facility] since Dr. Doreene Burke is listed as his PCP.  Pt in agreement and plans to go to [facility] today.

## 2014-05-23 ENCOUNTER — Emergency Department (HOSPITAL_COMMUNITY)
Admission: EM | Admit: 2014-05-23 | Discharge: 2014-05-23 | Disposition: A | Discharge status: Home / Self Care | Payer: MEDICAID | Attending: Emergency Medicine | Admitting: Emergency Medicine

## 2014-05-23 ENCOUNTER — Encounter (HOSPITAL_COMMUNITY): Payer: Self-pay

## 2014-05-23 DIAGNOSIS — R000 Tachycardia, unspecified: Secondary | ICD-10-CM | POA: Insufficient documentation

## 2014-05-23 DIAGNOSIS — J45909 Unspecified asthma, uncomplicated: Secondary | ICD-10-CM | POA: Insufficient documentation

## 2014-05-23 DIAGNOSIS — Z8781 Personal history of (healed) traumatic fracture: Secondary | ICD-10-CM | POA: Insufficient documentation

## 2014-05-23 DIAGNOSIS — G43909 Migraine, unspecified, not intractable, without status migrainosus: Secondary | ICD-10-CM | POA: Insufficient documentation

## 2014-05-23 DIAGNOSIS — K219 Gastro-esophageal reflux disease without esophagitis: Secondary | ICD-10-CM | POA: Insufficient documentation

## 2014-05-23 DIAGNOSIS — I10 Essential (primary) hypertension: Secondary | ICD-10-CM | POA: Insufficient documentation

## 2014-05-23 DIAGNOSIS — R1013 Epigastric pain: Secondary | ICD-10-CM

## 2014-05-23 DIAGNOSIS — R011 Cardiac murmur, unspecified: Secondary | ICD-10-CM | POA: Insufficient documentation

## 2014-05-23 DIAGNOSIS — Z720 Tobacco use: Secondary | ICD-10-CM | POA: Insufficient documentation

## 2014-05-23 DIAGNOSIS — R1084 Generalized abdominal pain: Secondary | ICD-10-CM | POA: Insufficient documentation

## 2014-05-23 DIAGNOSIS — D649 Anemia, unspecified: Secondary | ICD-10-CM | POA: Insufficient documentation

## 2014-05-23 DIAGNOSIS — F419 Anxiety disorder, unspecified: Secondary | ICD-10-CM | POA: Insufficient documentation

## 2014-05-23 DIAGNOSIS — F329 Major depressive disorder, single episode, unspecified: Secondary | ICD-10-CM | POA: Insufficient documentation

## 2014-05-23 DIAGNOSIS — Z79899 Other long term (current) drug therapy: Secondary | ICD-10-CM | POA: Insufficient documentation

## 2014-05-23 DIAGNOSIS — Z8739 Personal history of other diseases of the musculoskeletal system and connective tissue: Secondary | ICD-10-CM | POA: Insufficient documentation

## 2014-05-23 DIAGNOSIS — Z9889 Other specified postprocedural states: Secondary | ICD-10-CM | POA: Insufficient documentation

## 2014-05-23 LAB — COMPREHENSIVE METABOLIC PANEL
ALK PHOS: 104 U/L (ref 39–117)
ALT: 56 U/L — ABNORMAL HIGH (ref 0–53)
ANION GAP: 11 (ref 5–15)
AST: 53 U/L — ABNORMAL HIGH (ref 0–37)
Albumin: 4.9 g/dL (ref 3.5–5.2)
BUN: 16 mg/dL (ref 6–23)
CALCIUM: 9.4 mg/dL (ref 8.4–10.5)
CO2: 25 mmol/L (ref 19–32)
CREATININE: 0.81 mg/dL (ref 0.50–1.35)
Chloride: 101 mEq/L (ref 96–112)
GFR calc Af Amer: >90 mL/min (ref >90)
GFR calc non Af Amer: >90 mL/min (ref >90)
Glucose, Bld: 139 mg/dL — ABNORMAL HIGH (ref 70–99)
Potassium: 4 mmol/L (ref 3.5–5.1)
SODIUM: 137 mmol/L (ref 135–145)
Total Bilirubin: 1.2 mg/dL (ref 0.3–1.2)
Total Protein: 8.5 g/dL — ABNORMAL HIGH (ref 6.0–8.3)

## 2014-05-23 LAB — CBC WITH DIFFERENTIAL/PLATELET
BASOS ABS: 0 10*3/uL (ref 0.0–0.1)
Basophils Relative: 0 % (ref 0–1)
EOS PCT: 2 % (ref 0–5)
Eosinophils Absolute: 0.2 10*3/uL (ref 0.0–0.7)
HEMATOCRIT: 38 % — AB (ref 39.0–52.0)
Hemoglobin: 12.7 g/dL — ABNORMAL LOW (ref 13.0–17.0)
LYMPHS PCT: 27 % (ref 12–46)
Lymphs Abs: 3 10*3/uL (ref 0.7–4.0)
MCH: 29.1 pg (ref 26.0–34.0)
MCHC: 33.4 g/dL (ref 30.0–36.0)
MCV: 87 fL (ref 78.0–100.0)
Monocytes Absolute: 0.8 10*3/uL (ref 0.1–1.0)
Monocytes Relative: 7 % (ref 3–12)
NEUTROS PCT: 64 % (ref 43–77)
Neutro Abs: 7.1 10*3/uL (ref 1.7–7.7)
PLATELETS: 226 10*3/uL (ref 150–400)
RBC: 4.37 MIL/uL (ref 4.22–5.81)
RDW: 14.8 % (ref 11.5–15.5)
WBC: 11 10*3/uL — ABNORMAL HIGH (ref 4.0–10.5)

## 2014-05-23 LAB — URINALYSIS, ROUTINE W REFLEX MICROSCOPIC
BILIRUBIN URINE: NEGATIVE
Glucose, UA: NEGATIVE mg/dL
Hgb urine dipstick: NEGATIVE
Ketones, ur: NEGATIVE mg/dL
LEUKOCYTES UA: NEGATIVE
NITRITE: NEGATIVE
PH: 5.5 (ref 5.0–8.0)
Protein, ur: NEGATIVE mg/dL
Specific Gravity, Urine: 1.023 (ref 1.005–1.030)
Urobilinogen, UA: 0.2 mg/dL (ref 0.0–1.0)

## 2014-05-23 LAB — LIPASE, BLOOD: Lipase: 30 U/L (ref 11–59)

## 2014-05-23 MED ORDER — HYDROMORPHONE HCL 1 MG/ML IJ SOLN
1.0000 mg | Freq: Once | INTRAMUSCULAR | Status: AC
  Administered 2014-05-23: 1 mg via INTRAVENOUS
  Filled 2014-05-23: qty 1

## 2014-05-23 MED ORDER — PANTOPRAZOLE SODIUM 20 MG PO TBEC: 20.0000 mg | DELAYED_RELEASE_TABLET | Freq: Every day | ORAL | Status: DC

## 2014-05-23 MED ORDER — SODIUM CHLORIDE 0.9 % IV BOLUS (SEPSIS): 1000.0000 mL | Freq: Once | INTRAVENOUS | Status: DC

## 2014-05-23 MED ORDER — GI COCKTAIL ~~LOC~~
30.0000 mL | Freq: Once | ORAL | Status: AC
  Administered 2014-05-23: 30 mL via ORAL
  Filled 2014-05-23: qty 30

## 2014-05-23 MED ORDER — ONDANSETRON HCL 4 MG PO TABS: 4.0000 mg | ORAL_TABLET | Freq: Four times a day (QID) | ORAL | Status: DC

## 2014-05-23 MED ORDER — SODIUM CHLORIDE 0.9 % IV BOLUS (SEPSIS)
2000.0000 mL | Freq: Once | INTRAVENOUS | Status: AC
  Administered 2014-05-23: 2000 mL via INTRAVENOUS

## 2014-05-23 MED ORDER — PANTOPRAZOLE SODIUM 40 MG PO TBEC
80.0000 mg | DELAYED_RELEASE_TABLET | Freq: Every day | ORAL | Status: DC
Start: 2014-05-23 — End: 2014-05-23
  Administered 2014-05-23: 80 mg via ORAL
  Filled 2014-05-23: qty 2

## 2014-05-23 MED ORDER — SUCRALFATE 1 G PO TABS: 1.0000 g | ORAL_TABLET | Freq: Three times a day (TID) | ORAL | Status: DC

## 2014-05-23 NOTE — ED Provider Notes (Signed)
CSN: [id number]     Arrival date & time 05/23/14  1044 History   First MD Initiated Contact with Patient 05/23/14 1139     Chief Complaint  Patient presents with  . Abdominal Pain     (Consider location/radiation/quality/duration/timing/severity/associated sxs/prior Treatment) HPI [name] is a 44 y.o. male with multiple medical problems and a history of alcohol and polysubstance abuse, chronic pancreatitis comes in for evaluation of acute abdominal pain. Patient reports he has had epigastric and right upper quadrant pain since Wednesday night. Reports associated nausea. He did not come because he was in jail and they told him that he could not go to the hospital unless he was "pregnant or having a heart attack", he denies both. He reports being released yesterday at which point he took his prescribed Creon, oxycodone with only minimal relief. He does report drinking 2 12 ounce beers this morning at 4:00 AM. He reports he is actively seeking treatment for alcoholism at [facility]. Denies headache, changes in vision, hemoptysis, bloody stools, overt chest pain, shortness of breath, numbness or weakness.  Past Medical History  Diagnosis Date  . Hypertension   . Asthma   . Pancreatitis   . Cocaine abuse   . Depression   . H/O suicide attempt   . Heart murmur     "when he was little" (03/06/2013)  . Shortness of breath     "can happen at anytime" (03/06/2013)  . Anemia   . H/O hiatal hernia   . GERD (gastroesophageal reflux disease)   . Anxiety   . WPW (Wolff-Parkinson-White syndrome)     [name] 03/06/2013  . High cholesterol   . Migraine     "monthly" (01/30/2014)  . Arthritis     "knees; arms; elbows" (01/30/2014)  . Femoral condyle fracture 03/08/2014    left medial/notes 03/09/2014  . Alcoholism /alcohol abuse    Past Surgical History  Procedure Laterality Date  . Facial fracture surgery Left 1990's    "result of trauma"   . Eye surgery Left 1990's    "result of trauma"    . Left heart catheterization with coronary angiogram Right 03/07/2013    Procedure: LEFT HEART CATHETERIZATION WITH CORONARY ANGIOGRAM;  Surgeon: [name], MD;  Location: [facility];  Service: Cardiovascular;  Laterality: Right;   Family History  Problem Relation Age of Onset  . Hypertension Other   . Coronary artery disease Other    History  Substance Use Topics  . Smoking status: Current Every Day Smoker -- 0.50 packs/day for 30 years    Types: Cigarettes  . Smokeless tobacco: Current User    Types: Chew  . Alcohol Use: 28.2 oz/week    47 Cans of beer per week     Comment: 11/2//2015 "I drink 2, 40oz  beers per day"    Review of Systems  All other systems reviewed and are negative.  A 10 point review of systems was completed and was negative except for pertinent positives and negatives as mentioned in the history of present illness     Allergies  Shellfish-derived products and Trazodone and nefazodone  Home Medications   Prior to Admission medications   Medication Sig Start Date End Date Taking? Authorizing Provider  albuterol (PROVENTIL HFA;VENTOLIN HFA) 108 (90 BASE) MCG/ACT inhaler Inhale 2 puffs into the lungs every 6 (six) hours as needed for wheezing or shortness of breath (wheezing).    Yes Historical Provider, MD  folic acid (FOLVITE) 1 MG tablet Take  1 tablet (1 mg total) by mouth daily. 03/21/14  Yes Dawood Elgergawy, MD  lipase/protease/amylase (CREON) 12000 UNITS CPEP capsule Take 2 capsules (24,000 Units total) by mouth 3 (three) times daily with meals. 02/06/14  Yes [name], MD  loratadine (CLARITIN) 10 MG tablet Take 1 tablet (10 mg total) by mouth daily. 02/25/14  Yes Marianne L York, PA-C  metoprolol tartrate (LOPRESSOR) 25 MG tablet Take 1 tablet (25 mg total) by mouth 2 (two) times daily. 02/06/14  Yes [name], MD  Multiple Vitamin (MULTIVITAMIN) capsule Take 1 capsule by mouth daily. 02/06/14  Yes [name], MD   oxyCODONE (OXY IR/ROXICODONE) 5 MG immediate release tablet Take 1 tablet (5 mg total) by mouth every 4 (four) hours as needed for moderate pain. 03/21/14  Yes Phillips Climes, MD  promethazine (PHENERGAN) 25 MG tablet Take 1 tablet (25 mg total) by mouth every 6 (six) hours as needed for nausea or vomiting. 02/25/14  Yes Marianne L York, PA-C  QUEtiapine (SEROQUEL) 100 MG tablet Take 1 tablet (100 mg total) by mouth at bedtime. For mood control 01/15/14  Yes Shanker Kristeen Mans, MD  sertraline (ZOLOFT) 25 MG tablet Take 75 mg by mouth daily.   Yes Historical Provider, MD  thiamine 100 MG tablet Take 100 mg by mouth daily.    Yes Historical Provider, MD  loperamide (IMODIUM) 2 MG capsule Take 1-2 capsules (2-4 mg total) by mouth as needed for diarrhea or loose stools. 01/23/14   [name], PA-C  LORazepam (ATIVAN) 0.5 MG tablet Take 1 tablet (0.5 mg total) by mouth every 8 (eight) hours as needed for anxiety (signs of withdrawels including tremors, anxity, agitation.). 03/21/14   Phillips Climes, MD  ondansetron (ZOFRAN) 4 MG tablet Take 1 tablet (4 mg total) by mouth every 6 (six) hours. 05/23/14   Viona Gilmore Rees Santistevan, PA-C  pantoprazole (PROTONIX) 20 MG tablet Take 1 tablet (20 mg total) by mouth daily. 05/23/14   Viona Gilmore Shawanna Zanders, PA-C  sucralfate (CARAFATE) 1 G tablet Take 1 tablet (1 g total) by mouth 4 (four) times daily -  with meals and at bedtime. 05/23/14   Viona Gilmore Scarlettrose Costilow, PA-C   BP 164/107 mmHg  Pulse 106  Temp(Src) 98.8 F (37.1 C) (Oral)  Resp 17  SpO2 100% Physical Exam  Constitutional: He is oriented to person, place, and time. He appears well-developed and well-nourished.  HENT:  Head: Normocephalic and atraumatic.  Dry mucous membranes  Eyes: Conjunctivae are normal. Pupils are equal, round, and reactive to light. Right eye exhibits no discharge. Left eye exhibits no discharge. No scleral icterus.  Neck: Normal range of motion. Neck supple.  Cardiovascular: Regular  rhythm and normal heart sounds.   Tachycardic  Pulmonary/Chest: Effort normal and breath sounds normal. No respiratory distress. He has no wheezes. He has no rales.  Abdominal: Soft. There is no tenderness.  Diffuse tenderness in left upper quadrant and epigastrium. No rebound or guarding. Abdomen is otherwise nontender, nondistended. No peritoneal signs. No other lesions or deformities  Musculoskeletal: He exhibits no tenderness.  Neurological: He is alert and oriented to person, place, and time.  Cranial Nerves II-XII grossly intact  Skin: Skin is warm and dry. No rash noted.  Psychiatric: He has a normal mood and affect.  Nursing note and vitals reviewed.   ED Course  Procedures (including critical care time) Labs Review Labs Reviewed  CBC WITH DIFFERENTIAL - Abnormal; Notable for the following:    WBC  11.0 (*)    Hemoglobin 12.7 (*)    HCT 38.0 (*)    All other components within normal limits  COMPREHENSIVE METABOLIC PANEL - Abnormal; Notable for the following:    Glucose, Bld 139 (*)    Total Protein 8.5 (*)    AST 53 (*)    ALT 56 (*)    All other components within normal limits  URINALYSIS, ROUTINE W REFLEX MICROSCOPIC  LIPASE, BLOOD    Imaging Review No results found.   EKG Interpretation None     Meds given in ED:  Medications  pantoprazole (PROTONIX) EC tablet 80 mg (80 mg Oral Given 05/23/14 1342)  gi cocktail (Maalox,Lidocaine,Donnatal) (30 mLs Oral Given 05/23/14 1224)  HYDROmorphone (DILAUDID) injection 1 mg (1 mg Intravenous Given 05/23/14 1224)  sodium chloride 0.9 % bolus 2,000 mL (2,000 mLs Intravenous New Bag/Given 05/23/14 1248)  HYDROmorphone (DILAUDID) injection 1 mg (1 mg Intravenous Given 05/23/14 1319)    New Prescriptions   ONDANSETRON (ZOFRAN) 4 MG TABLET    Take 1 tablet (4 mg total) by mouth every 6 (six) hours.   PANTOPRAZOLE (PROTONIX) 20 MG TABLET    Take 1 tablet (20 mg total) by mouth daily.   SUCRALFATE (CARAFATE) 1 G TABLET    Take 1  tablet (1 g total) by mouth 4 (four) times daily -  with meals and at bedtime.   Filed Vitals:   05/23/14 1119 05/23/14 1358  BP: 99/72 164/107  Pulse: 120 106  Temp: 98.8 F (37.1 C)   TempSrc: Oral   Resp: 18 17  SpO2: 100% 100%    MDM  Vitals stable  -afebrile, tachycardia improving in ED. Encouraged outpatient follow-up for his blood pressure. Pt resting comfortably in ED. Reports that he feels much better and his pain is improved since being in ED PE--physical exam is reassuring. Abdomen is soft, nontender without peritoneal signs. Patient is alert, has been rehydrated and is tolerating PO in the ED. Labwork--lipase is normal, labs otherwise noncontributory.  DDX--no evidence of active pancreatitis, nausea has been controlled in the ED. We'll discharge with PPI, Carafate, Zofran and referral for GI. Resource guide given as well. Patient epigastric discomfort likely multifactorial with chronic pancreatitis, acute gastritis secondary to excessive alcohol consumption. Encouraged patient on cessation of alcohol consumption and discussed outpatient detox facilities.  I discussed all relevant lab findings and imaging results with pt and they verbalized understanding. Discussed f/u with PCP within 48 hrs and return precautions, pt very amenable to plan.   Prior to patient discharge, I discussed and reviewed this case with Dr. Reather Converse    Final diagnoses:  Epigastric discomfort        Viona Gilmore [place], PA-C 05/23/14 1405  Mariea Clonts, MD 05/27/14 [phone]

## 2014-05-23 NOTE — ED Notes (Signed)
Per EMS, pt from home.  Pt with hx of pancreatitis.  Pt has had abdominal pain since wed.  N/V x 2 today.  No fever.  Difficulty urinating.  Vitals: 140/100, hr 120, resp 18, 98% ra

## 2014-05-23 NOTE — ED Notes (Signed)
Pt leaving with ALL belonging he arrived with. He was advised to follow up with GI in 2 days

## 2014-05-23 NOTE — ED Notes (Signed)
Pt ambulated to restroom with steady gait.

## 2014-05-23 NOTE — Discharge Instructions (Signed)
°Emergency Department Resource Guide °1) Find a Doctor and Pay Out of Pocket °Although you won't have to find out who is covered by your insurance plan, it is a good idea to ask around and get recommendations. You will then need to call the office and see if the doctor you have chosen will accept you as a new patient and what types of options they offer for patients who are self-pay. Some doctors offer discounts or will set up payment plans for their patients who do not have insurance, but you will need to ask so you aren't surprised when you get to your appointment. ° °2) Contact Your Local Health Department °Not all health departments have doctors that can see patients for sick visits, but many do, so it is worth a call to see if yours does. If you don't know where your local health department is, you can check in your phone book. The CDC also has a tool to help you locate your state's health department, and many state websites also have listings of all of their local health departments. ° °3) Find a Walk-in Clinic °If your illness is not likely to be very severe or complicated, you may want to try a walk in clinic. These are popping up all over the country in pharmacies, drugstores, and shopping centers. They're usually staffed by nurse practitioners or physician assistants that have been trained to treat common illnesses and complaints. They're usually fairly quick and inexpensive. However, if you have serious medical issues or chronic medical problems, these are probably not your best option. ° °No Primary Care Doctor: °- Call Health Connect at  832-8000 - they can help you locate a primary care doctor that  accepts your insurance, provides certain services, etc. °- Physician Referral Service- 1-800-533-3463 ° °Chronic Pain Problems: °Organization         Address  Phone   Notes  °[facility]  (336) [phone] Patients need to be referred by their primary care doctor.  ° °Medication  Assistance: °Organization         Address  Phone   Notes  °[place] [facility] [address] °[place], [place] [address] (336) 641-8030 --Must be a resident of [place] °-- Must have NO insurance coverage whatsoever (no Medicaid/ Medicare, etc.) °-- The pt. MUST have a primary care doctor that directs their care regularly and follows them in the community °  °[facility]  (866) 331-1348   °[employer]  (888) 892-1162   ° °Agencies that provide inexpensive medical care: °Organization         Address  Phone   Notes  °[facility]  (336) 832-8035   °[facility]    (336) 832-7272   °[facility] [address] °[place], [place] [address] (336) 832-4777   °[facility] [address], °[place] (336) 271-4999   °[facility]    (336) 373-0678   °[facility]    (336) 272-1050   °[facility] ° [address], [place] Phone:  (336) 832-4444, Fax:  (336) 832-4440 Hours of Operation:  9 am - 6 pm, M-F.  Also accepts Medicaid/Medicare and self-pay.  °[facility] ° [address], [place] Phone: (336) 832-3150, Fax: (336) [phone]. Hours of Operation:  8:30 am - 5:30 pm, M-F.  Also accepts Medicaid and self-pay.  °[facility] [place] [address], [place] Phone: (336) 878-6027   °[facility] [address], [place], [place] (336)723-1848, Ext. 123 Mondays & Thursdays: 7-9 AM.  First 15 patients are seen on a first come, first serve basis. °  ° °Medicaid-accepting [place] Providers: ° °Organization         Address  Phone   Notes  °[facility] [address], [place] (336) 641-2100 Also accepts self-pay patients.  °[facility] [address], [place] ° (336) 856-9996   °[facility] [address], [place]  (336) 288-8857   °[facility] [address] [place] [address], [place] (336) 299-7000   °Veita Bland [address], [place]  ° (336) 373-1557 Only accepts [place] Access Medicaid patients after they have their name applied to their card.  ° °Self-Pay (no insurance) in [place]: ° °Organization         Address  Phone   Notes  °Sickle Cell Patients, [facility] [address], [place] (336) 832-1970   °[facility] [address], [place] (336) 832-4400   °[facility] ° [address], [place] (336) 992-4800   °[facility]/Dr. Osei-Bonsu ° [address] [place] [address], [place] or [address], [place] (336) [phone] Phone number for both [place] and [place] locations is the same.  °[facility] [address], [place] (336) 299-0000   °[facility] [address] [place] [address], [place] or [address] (336) 852-7530 °(336) 878-2260   °[facility] [address], [place] (336) 350-1642, phone; (336) 294-5005, fax Sees patients 1st and 3rd Saturday of every month.  Must not qualify for public or private insurance (i.e. Medicaid, Medicare, [place] Health Choice, Veterans' Benefits) • Household income should be no more than 200% of the poverty level •The clinic cannot treat you if you are pregnant or think you are pregnant • Sexually transmitted diseases are not treated at the clinic.  ° ° °Dental Care: °Organization         Address  Phone  Notes  °[place] [facility] [address], [place] (336) 641-6152 Accepts children up to age 21 who are enrolled in Medicaid or [place] Health Choice; pregnant women with a Medicaid card; and children who have applied for Medicaid or [place] Health Choice, but were declined, whose parents can pay a reduced fee at time of service.  °[place]  [facility] [place]  [address], [place] (336) 641-7733 Accepts children up to age 21 who are enrolled in Medicaid or [place] Health Choice; pregnant women with a Medicaid card; and children who have applied for Medicaid or [place] Health Choice, but were declined, whose parents can pay a reduced fee at time of service.  °[facility] ° [address], [place] (336) 641-4533 Patients are seen by appointment only. Walk-ins are not accepted. [facility] will see patients 18 years of age and older. °Monday - Tuesday (8am-5pm) °Most Wednesdays (8:30-5pm) °$30 per visit, cash only  °[facility] ° [address], [place] (336) 641-4533 Patients are seen by appointment only. Walk-ins are not accepted. [facility] will see patients 18 years of age and older. °One   Wednesday Evening (Monthly: Volunteer Based).  $30 per visit, cash only  °[facility]  (919) 537-3737 for adults; Children under age 4, call Graduate Pediatric Dentistry at ([phone]) [phone]. Children aged 4-14, please call ([phone]) [phone] to request a pediatric application. ° Dental services are provided in all areas of dental care including fillings, crowns and bridges, complete and partial dentures, implants, gum treatment, root canals, and extractions. Preventive care is also provided. Treatment is provided to both adults and children. °Patients are selected via a lottery and there is often a waiting list. °  °[facility] [address], °[place] ° (336) 763-8833 [email or web address] °  °[facility] [address], [place], [place] (336)723-1848, Ext. 123 Second and Fourth Thursday of each month, opens at 6:30 AM; Clinic ends at 9 AM.  Patients are seen on a first-come first-served basis, and a limited number are seen during each clinic.  ° °[facility] ° [address], [place], [place] (336) 723-7904    Eligibility Requirements °You must have lived in [place], [place], or [place] counties for at least the last three months. °  You cannot be eligible for state or federal sponsored healthcare insurance, including Veterans Administration, Medicaid, or Medicare. °  You generally cannot be eligible for healthcare insurance through your employer.  °  How to apply: °Eligibility screenings are held every Tuesday and Wednesday afternoon from 1:00 pm until 4:00 pm. You do not need an appointment for the interview!  °[facility] [address], [place], [place] 336-631-2330   °[facility]  336-342-8273   °[place]  336-703-3100   °[place] [facility]  336-570-6415   ° °Behavioral Health Resources in the Community: °Intensive Outpatient Programs °Organization         Address  Phone  Notes  °[facility] [address], [place], [place] 336-878-6098   °[facility] [address], [place], [place] 336-832-9800   °[facility] [address], [place], [place] ° 336-882-2125   °[facility] [address],  °[place], [place] 1-800-853-5163 or 336-641-4981   °Substance Abuse Resources °Organization         Address  Phone  Notes  °[facility]  336-882-2125   °[facility]  336-784-9470   °[facility]  336-285-9073   °[facility]  336-845-3988   °Residential & Outpatient Substance Abuse Program  1-800-659-3381   °Psychological Services °Organization         Address  Phone  Notes  °[facility]  336- 832-9600   °[facility]  336- 378-7881   °[facility] [address], [place] 1-800-853-5163 or 336-641-4981   ° °Mobile Crisis Teams °Organization         Address  Phone  Notes  °[facility], Mobile Crisis Care Unit  1-877-626-1772   °Assertive °[facility] ° [address]  [place], [place] 336-834-9664   °Sharon DeEsch [address] °[place] 336-554-5454   ° °Self-Help/Support Groups °Organization         Address  Phone             Notes  °[facility] [place] - variety of support groups  336- 373-1402 Call for more information  °Narcotics Anonymous (NA), [facility] [address], °[place]   2 meetings at this location  ° °  Residential Treatment Programs Organization         Address  Phone  Notes  [facility] [address],    [place]  1-641-631-0286   [facility]  [address] [place] [address], [place], [place]   [place], [place] [phone] Admissions: 8am-3pm M-F  [facility] [place] [address],    [place], [place] 542-706-2376   [facility] [address] [place], [place], [place]   [facility] [address],  [place], [place]   [facility] - Intensive Outpatient [address], [place], [place]   [facility] ([place]  [place], [place] 1-3014673266 or (484)482-9453   [facility] [address], [place], [place] Accepts Medicaid  [facility] [address],  [place] 1-(773)854-6042 Substance Abuse/Addiction Treatment   [facility]         Address  Phone  Notes  [facility]  319-820-9526   Domenic Schwab, PhD [address] [place], [place]   603-798-1336 or [phone]   [facility] [place], [place] (416)437-0579   [facility] [address] [place], [place] (617)028-7732 Insurance/Medicaid/sponsorship through [facility] [address]                                    [place], [place] ([phone] [place] [address], [place] [phone]    Dr. Adele Schilder  [phone]   [facility] Dept. 1) [address], [place] 2) [place] 3)  [address], [place] [phone]  (434) [phone]   [place] (203) 524-5342 or 539-163-1624 (After Hours)      Is important. Follow-up with gastroenterology for further evaluation and management of your symptoms. Please take all your medications as prescribed. Return to ED for worsening symptoms. It is also important for you to discontinue alcohol use and seek detoxification at a qualified facility. This information can be found in your resource guide on page 8.

## 2014-05-23 NOTE — ED Notes (Signed)
Ben PA at bedside.

## 2014-05-23 NOTE — ED Notes (Signed)
PA Ben at bedside

## 2014-05-25 ENCOUNTER — Encounter (HOSPITAL_COMMUNITY): Payer: Self-pay | Admitting: Emergency Medicine

## 2014-05-25 ENCOUNTER — Emergency Department (HOSPITAL_COMMUNITY): Payer: Self-pay

## 2014-05-25 ENCOUNTER — Emergency Department (HOSPITAL_COMMUNITY)
Admission: EM | Admit: 2014-05-25 | Discharge: 2014-05-25 | Disposition: A | Discharge status: Home / Self Care | Payer: Self-pay | Attending: Emergency Medicine | Admitting: Emergency Medicine

## 2014-05-25 DIAGNOSIS — D649 Anemia, unspecified: Secondary | ICD-10-CM | POA: Insufficient documentation

## 2014-05-25 DIAGNOSIS — I10 Essential (primary) hypertension: Secondary | ICD-10-CM | POA: Insufficient documentation

## 2014-05-25 DIAGNOSIS — F329 Major depressive disorder, single episode, unspecified: Secondary | ICD-10-CM | POA: Insufficient documentation

## 2014-05-25 DIAGNOSIS — R109 Unspecified abdominal pain: Secondary | ICD-10-CM

## 2014-05-25 DIAGNOSIS — J45909 Unspecified asthma, uncomplicated: Secondary | ICD-10-CM | POA: Insufficient documentation

## 2014-05-25 DIAGNOSIS — Z8781 Personal history of (healed) traumatic fracture: Secondary | ICD-10-CM | POA: Insufficient documentation

## 2014-05-25 DIAGNOSIS — Z9889 Other specified postprocedural states: Secondary | ICD-10-CM | POA: Insufficient documentation

## 2014-05-25 DIAGNOSIS — R110 Nausea: Secondary | ICD-10-CM | POA: Insufficient documentation

## 2014-05-25 DIAGNOSIS — Z8639 Personal history of other endocrine, nutritional and metabolic disease: Secondary | ICD-10-CM | POA: Insufficient documentation

## 2014-05-25 DIAGNOSIS — F419 Anxiety disorder, unspecified: Secondary | ICD-10-CM | POA: Insufficient documentation

## 2014-05-25 DIAGNOSIS — Z87828 Personal history of other (healed) physical injury and trauma: Secondary | ICD-10-CM | POA: Insufficient documentation

## 2014-05-25 DIAGNOSIS — K219 Gastro-esophageal reflux disease without esophagitis: Secondary | ICD-10-CM | POA: Insufficient documentation

## 2014-05-25 DIAGNOSIS — Z79899 Other long term (current) drug therapy: Secondary | ICD-10-CM | POA: Insufficient documentation

## 2014-05-25 DIAGNOSIS — Z720 Tobacco use: Secondary | ICD-10-CM | POA: Insufficient documentation

## 2014-05-25 DIAGNOSIS — R1013 Epigastric pain: Secondary | ICD-10-CM | POA: Insufficient documentation

## 2014-05-25 DIAGNOSIS — Z8739 Personal history of other diseases of the musculoskeletal system and connective tissue: Secondary | ICD-10-CM | POA: Insufficient documentation

## 2014-05-25 DIAGNOSIS — R011 Cardiac murmur, unspecified: Secondary | ICD-10-CM | POA: Insufficient documentation

## 2014-05-25 LAB — CBC WITH DIFFERENTIAL/PLATELET
Basophils Absolute: 0 10*3/uL (ref 0.0–0.1)
Basophils Relative: 0 % (ref 0–1)
EOS PCT: 3 % (ref 0–5)
Eosinophils Absolute: 0.3 10*3/uL (ref 0.0–0.7)
HEMATOCRIT: 33.5 % — AB (ref 39.0–52.0)
HEMOGLOBIN: 11.4 g/dL — AB (ref 13.0–17.0)
Lymphocytes Relative: 37 % (ref 12–46)
Lymphs Abs: 3.3 10*3/uL (ref 0.7–4.0)
MCH: 28.9 pg (ref 26.0–34.0)
MCHC: 34 g/dL (ref 30.0–36.0)
MCV: 85 fL (ref 78.0–100.0)
Monocytes Absolute: 0.8 10*3/uL (ref 0.1–1.0)
Monocytes Relative: 9 % (ref 3–12)
NEUTROS ABS: 4.5 10*3/uL (ref 1.7–7.7)
Neutrophils Relative %: 51 % (ref 43–77)
Platelets: 205 10*3/uL (ref 150–400)
RBC: 3.94 MIL/uL — AB (ref 4.22–5.81)
RDW: 14.7 % (ref 11.5–15.5)
WBC: 8.9 10*3/uL (ref 4.0–10.5)

## 2014-05-25 LAB — COMPREHENSIVE METABOLIC PANEL
ALK PHOS: 93 U/L (ref 39–117)
ALT: 41 U/L (ref 0–53)
AST: 41 U/L — AB (ref 0–37)
Albumin: 4.1 g/dL (ref 3.5–5.2)
Anion gap: 9 (ref 5–15)
BUN: 13 mg/dL (ref 6–23)
CALCIUM: 9.4 mg/dL (ref 8.4–10.5)
CO2: 27 mmol/L (ref 19–32)
Chloride: 105 mEq/L (ref 96–112)
Creatinine, Ser: 0.64 mg/dL (ref 0.50–1.35)
Glucose, Bld: 100 mg/dL — ABNORMAL HIGH (ref 70–99)
Potassium: 4.4 mmol/L (ref 3.5–5.1)
Sodium: 141 mmol/L (ref 135–145)
TOTAL PROTEIN: 7.4 g/dL (ref 6.0–8.3)
Total Bilirubin: 0.9 mg/dL (ref 0.3–1.2)

## 2014-05-25 LAB — LIPASE, BLOOD: LIPASE: 45 U/L (ref 11–59)

## 2014-05-25 MED ORDER — ONDANSETRON HCL 4 MG/2ML IJ SOLN
4.0000 mg | Freq: Once | INTRAMUSCULAR | Status: AC
  Administered 2014-05-25: 4 mg via INTRAVENOUS
  Filled 2014-05-25: qty 2

## 2014-05-25 MED ORDER — SODIUM CHLORIDE 0.9 % IV BOLUS (SEPSIS)
1000.0000 mL | Freq: Once | INTRAVENOUS | Status: AC
  Administered 2014-05-25: 1000 mL via INTRAVENOUS

## 2014-05-25 MED ORDER — IOHEXOL 300 MG/ML  SOLN
100.0000 mL | Freq: Once | INTRAMUSCULAR | Status: AC | PRN
  Administered 2014-05-25: 100 mL via INTRAVENOUS

## 2014-05-25 MED ORDER — GI COCKTAIL ~~LOC~~: 30.0000 mL | Freq: Two times a day (BID) | ORAL | Status: DC

## 2014-05-25 MED ORDER — HYDROMORPHONE HCL 1 MG/ML IJ SOLN
1.0000 mg | Freq: Once | INTRAMUSCULAR | Status: AC
  Administered 2014-05-25: 1 mg via INTRAVENOUS
  Filled 2014-05-25: qty 1

## 2014-05-25 NOTE — ED Notes (Signed)
CT notified that pt is finished with contrast.  

## 2014-05-25 NOTE — Progress Notes (Signed)
ED CM noted patient to have had 20 ED visits and 8 hospitalization for related complaints. Patient presented to [facility] ED with abdominal pains history of alcoholism. Reviewed record, Dr. Doreene Burke PCP/ no health insurance.Patient  no showed last visit.  Met with patient  discussed the benefits and  importance of primary care, patient verbalized understanding. Patient reports he last had a drink this past Thursday. He states, he would like to stop drinking, denies SI/HI. Provided patient with [facility] resources for OP treatment programs. Scheduled f/u appt with [facility] for Monday at 2p with walk-in clinic, encouraged patient to f/u Patient verbalized understanding teach back done. Cecille Rubin Therapist, sports and K. Sofia PA-C both updated on disposition plan. No further ED CM needs identified

## 2014-05-25 NOTE — ED Notes (Addendum)
Pt in via EMS with c/o abdominal pain since Thursday. Pt was seen at the hospital Thursday for same and was told that he had pancreatitis due to alcohol use. Pt was told to continue to take his medications, but pt is out of his GI cocktail. Pt reports N/V. Last alcohol use was Thursday. At end of triage pt reports chest pain onset today when trying to go to sleep.

## 2014-05-25 NOTE — Discharge Instructions (Signed)
Chronic Pain Chronic pain can be defined as pain that is off and on and lasts for 3-6 months or longer. Many things cause chronic pain, which can make it difficult to make a diagnosis. There are many treatment options available for chronic pain. However, finding a treatment that works well for you may require trying various approaches until the right one is found. Many people benefit from a combination of two or more types of treatment to control their pain. SYMPTOMS  Chronic pain can occur anywhere in the body and can range from mild to very severe. Some types of chronic pain include:  Headache.  Low back pain.  Cancer pain.  Arthritis pain.  Neurogenic pain. This is pain resulting from damage to nerves. People with chronic pain may also have other symptoms such as:  Depression.  Anger.  Insomnia.  Anxiety. DIAGNOSIS  Your health care provider will help diagnose your condition over time. In many cases, the initial focus will be on excluding possible conditions that could be causing the pain. Depending on your symptoms, your health care provider may order tests to diagnose your condition. Some of these tests may include:   Blood tests.   CT scan.   MRI.   X-rays.   Ultrasounds.   Nerve conduction studies.  You may need to see a specialist.  TREATMENT  Finding treatment that works well may take time. You may be referred to a pain specialist. He or she may prescribe medicine or therapies, such as:   Mindful meditation or yoga.  Shots (injections) of numbing or pain-relieving medicines into the spine or area of pain.  Local electrical stimulation.  Acupuncture.   Massage therapy.   Aroma, color, light, or sound therapy.   Biofeedback.   Working with a physical therapist to keep from getting stiff.   Regular, gentle exercise.   Cognitive or behavioral therapy.   Group support.  Sometimes, surgery may be recommended.  HOME CARE INSTRUCTIONS    Take all medicines as directed by your health care provider.   Lessen stress in your life by relaxing and doing things such as listening to calming music.   Exercise or be active as directed by your health care provider.   Eat a healthy diet and include things such as vegetables, fruits, fish, and lean meats in your diet.   Keep all follow-up appointments with your health care provider.   Attend a support group with others suffering from chronic pain. SEEK MEDICAL CARE IF:   Your pain gets worse.   You develop a new pain that was not there before.   You cannot tolerate medicines given to you by your health care provider.   You have new symptoms since your last visit with your health care provider.  SEEK IMMEDIATE MEDICAL CARE IF:   You feel weak.   You have decreased sensation or numbness.   You lose control of bowel or bladder function.   Your pain suddenly gets much worse.   You develop shaking.  You develop chills.  You develop confusion.  You develop chest pain.  You develop shortness of breath.  MAKE SURE YOU:  Understand these instructions.  Will watch your condition.  Will get help right away if you are not doing well or get worse. Document Released: 01/15/2002 Document Revised: 12/26/2012 Document Reviewed: 10/19/2012 [facility] Patient Information 2015 [place], [place]. This information is not intended to replace advice given to you by your health care provider. Make sure you discuss any  questions you have with your health care provider.  Abdominal Pain Many things can cause abdominal pain. Usually, abdominal pain is not caused by a disease and will improve without treatment. It can often be observed and treated at home. Your health care provider will do a physical exam and possibly order blood tests and X-rays to help determine the seriousness of your pain. However, in many cases, more time must pass before a clear cause of the pain can be  found. Before that point, your health care provider may not know if you need more testing or further treatment. HOME CARE INSTRUCTIONS  Monitor your abdominal pain for any changes. The following actions may help to alleviate any discomfort you are experiencing:  Only take over-the-counter or prescription medicines as directed by your health care provider.  Do not take laxatives unless directed to do so by your health care provider.  Try a clear liquid diet (broth, tea, or water) as directed by your health care provider. Slowly move to a bland diet as tolerated. SEEK MEDICAL CARE IF:  You have unexplained abdominal pain.  You have abdominal pain associated with nausea or diarrhea.  You have pain when you urinate or have a bowel movement.  You experience abdominal pain that wakes you in the night.  You have abdominal pain that is worsened or improved by eating food.  You have abdominal pain that is worsened with eating fatty foods.  You have a fever. SEEK IMMEDIATE MEDICAL CARE IF:   Your pain does not go away within 2 hours.  You keep throwing up (vomiting).  Your pain is felt only in portions of the abdomen, such as the right side or the left lower portion of the abdomen.  You pass bloody or black tarry stools. MAKE SURE YOU:  Understand these instructions.   Will watch your condition.   Will get help right away if you are not doing well or get worse.  Document Released: 02/02/2005 Document Revised: 04/30/2013 Document Reviewed: 01/02/2013 [facility] Patient Information 2015 [place], [place]. This information is not intended to replace advice given to you by your health care provider. Make sure you discuss any questions you have with your health care provider.

## 2014-05-25 NOTE — ED Provider Notes (Signed)
CSN: [id number]     Arrival date & time 05/25/14  2952 History   First MD Initiated Contact with Patient 05/25/14 [phone]     Chief Complaint  Patient presents with  . Abdominal Pain  . Medication Refill  . Chest Pain     (Consider location/radiation/quality/duration/timing/severity/associated sxs/prior Treatment) Patient is a 44 y.o. male presenting with abdominal pain. The history is provided by the patient. No language interpreter was used.  Abdominal Pain Pain location:  Epigastric Pain quality: aching and gnawing   Pain radiates to:  Does not radiate Pain severity:  Moderate Onset quality:  Gradual Timing:  Constant Progression:  Worsening Chronicity:  New Context: alcohol use   Relieved by:  Nothing Worsened by:  Nothing tried Ineffective treatments:  None tried Associated symptoms: nausea   Pt reports he has a history of pancreatitis.  Pt is out of gi cocktail.   Pt reports he thinks he is getting worse.  Pt reports unable to see primary.   Past Medical History  Diagnosis Date  . Hypertension   . Asthma   . Pancreatitis   . Cocaine abuse   . Depression   . H/O suicide attempt   . Heart murmur     "when he was little" (03/06/2013)  . Shortness of breath     "can happen at anytime" (03/06/2013)  . Anemia   . H/O hiatal hernia   . GERD (gastroesophageal reflux disease)   . Anxiety   . WPW (Wolff-Parkinson-White syndrome)     [name] 03/06/2013  . High cholesterol   . Migraine     "monthly" (01/30/2014)  . Arthritis     "knees; arms; elbows" (01/30/2014)  . Femoral condyle fracture 03/08/2014    left medial/notes 03/09/2014  . Alcoholism /alcohol abuse    Past Surgical History  Procedure Laterality Date  . Facial fracture surgery Left 1990's    "result of trauma"   . Eye surgery Left 1990's    "result of trauma"   . Left heart catheterization with coronary angiogram Right 03/07/2013    Procedure: LEFT HEART CATHETERIZATION WITH CORONARY ANGIOGRAM;  Surgeon:  [name], MD;  Location: [facility];  Service: Cardiovascular;  Laterality: Right;   Family History  Problem Relation Age of Onset  . Hypertension Other   . Coronary artery disease Other    History  Substance Use Topics  . Smoking status: Current Every Day Smoker -- 0.50 packs/day for 30 years    Types: Cigarettes  . Smokeless tobacco: Current User    Types: Chew  . Alcohol Use: 28.2 oz/week    47 Cans of beer per week     Comment: 11/2//2015 "I drink 2, 40oz  beers per day"    Review of Systems  Gastrointestinal: Positive for nausea and abdominal pain.  All other systems reviewed and are negative.     Allergies  Shellfish-derived products and Trazodone and nefazodone  Home Medications   Prior to Admission medications   Medication Sig Start Date End Date Taking? Authorizing Provider  albuterol (PROVENTIL HFA;VENTOLIN HFA) 108 (90 BASE) MCG/ACT inhaler Inhale 2 puffs into the lungs every 6 (six) hours as needed for wheezing or shortness of breath (wheezing).    Yes Historical Provider, MD  folic acid (FOLVITE) 1 MG tablet Take 1 tablet (1 mg total) by mouth daily. 03/21/14  Yes Dawood Elgergawy, MD  lipase/protease/amylase (CREON) 12000 UNITS CPEP capsule Take 2 capsules (24,000 Units total) by mouth 3 (three) times daily  with meals. 02/06/14  Yes [name], MD  loperamide (IMODIUM) 2 MG capsule Take 1-2 capsules (2-4 mg total) by mouth as needed for diarrhea or loose stools. 01/23/14  Yes Marianne L York, PA-C  loratadine (CLARITIN) 10 MG tablet Take 1 tablet (10 mg total) by mouth daily. 02/25/14  Yes Marianne L York, PA-C  LORazepam (ATIVAN) 0.5 MG tablet Take 1 tablet (0.5 mg total) by mouth every 8 (eight) hours as needed for anxiety (signs of withdrawels including tremors, anxity, agitation.). 03/21/14  Yes Phillips Climes, MD  metoprolol tartrate (LOPRESSOR) 25 MG tablet Take 1 tablet (25 mg total) by mouth 2 (two) times daily. 02/06/14  Yes [name], MD  Multiple Vitamin (MULTIVITAMIN) capsule Take 1 capsule by mouth daily. 02/06/14  Yes [name], MD  ondansetron (ZOFRAN) 4 MG tablet Take 1 tablet (4 mg total) by mouth every 6 (six) hours. 05/23/14  Yes Viona Gilmore Cartner, PA-C  oxyCODONE (OXY IR/ROXICODONE) 5 MG immediate release tablet Take 1 tablet (5 mg total) by mouth every 4 (four) hours as needed for moderate pain. 03/21/14  Yes Dawood Elgergawy, MD  pantoprazole (PROTONIX) 20 MG tablet Take 1 tablet (20 mg total) by mouth daily. 05/23/14  Yes Viona Gilmore Cartner, PA-C  promethazine (PHENERGAN) 25 MG tablet Take 1 tablet (25 mg total) by mouth every 6 (six) hours as needed for nausea or vomiting. 02/25/14  Yes Marianne L York, PA-C  QUEtiapine (SEROQUEL) 100 MG tablet Take 1 tablet (100 mg total) by mouth at bedtime. For mood control 01/15/14  Yes Shanker Kristeen Mans, MD  sertraline (ZOLOFT) 25 MG tablet Take 75 mg by mouth daily.   Yes Historical Provider, MD  sucralfate (CARAFATE) 1 G tablet Take 1 tablet (1 g total) by mouth 4 (four) times daily -  with meals and at bedtime. 05/23/14  Yes Viona Gilmore Cartner, PA-C  thiamine 100 MG tablet Take 100 mg by mouth daily.    Yes Historical Provider, MD   BP 151/103 mmHg  Pulse 98  Temp(Src) 97.8 F (36.6 C) (Oral)  Resp 18  Ht 5\' 9"  (1.753 m)  Wt 139 lb (63.05 kg)  BMI 20.52 kg/m2  SpO2 99% Physical Exam  Constitutional: He is oriented to person, place, and time. He appears well-developed and well-nourished.  HENT:  Head: Normocephalic.  Right Ear: External ear normal.  Left Ear: External ear normal.  Nose: Nose normal.  Mouth/Throat: Oropharynx is clear and moist.  Eyes: Conjunctivae and EOM are normal. Pupils are equal, round, and reactive to light.  Neck: Normal range of motion.  Cardiovascular: Normal rate.   Pulmonary/Chest: Effort normal and breath sounds normal.  Abdominal: Soft. He exhibits no distension.  Musculoskeletal: Normal range of motion.   Neurological: He is alert and oriented to person, place, and time.  Skin: Skin is warm.  Psychiatric: He has a normal mood and affect.  Nursing note and vitals reviewed.   ED Course  Procedures (including critical care time) Labs Review Labs Reviewed  CBC WITH DIFFERENTIAL - Abnormal; Notable for the following:    RBC 3.94 (*)    Hemoglobin 11.4 (*)    HCT 33.5 (*)    All other components within normal limits  COMPREHENSIVE METABOLIC PANEL - Abnormal; Notable for the following:    Glucose, Bld 100 (*)    AST 41 (*)    All other components within normal limits  LIPASE, BLOOD    Imaging Review No results found.  EKG Interpretation None      MDM  Pt given Iv fluids, dilaudid and zofran.   Ct scan shows improvement in pseudocysts overall except one area that has continued.   Pt eating and drinking normally Final diagnoses:  Abdominal pain    Pt given rx for gi cocktail.   Social work spoke with pt.   He has missed multiple appointment at the [facility].  Pt is rescheduled for follow up.   Pt is advised of need to be seen for follow up.    [place], PA-C 05/25/14 [place], MD 05/26/14 0930

## 2014-05-25 NOTE — ED Notes (Signed)
Pt signed discharge signature not showing.

## 2014-05-27 ENCOUNTER — Encounter (HOSPITAL_COMMUNITY): Payer: Self-pay | Admitting: *Deleted

## 2014-05-27 ENCOUNTER — Emergency Department (HOSPITAL_COMMUNITY)
Admission: EM | Admit: 2014-05-27 | Discharge: 2014-05-28 | Disposition: A | Discharge status: Home / Self Care | Payer: MEDICAID | Attending: Emergency Medicine | Admitting: Emergency Medicine

## 2014-05-27 ENCOUNTER — Emergency Department (HOSPITAL_COMMUNITY): Payer: MEDICAID

## 2014-05-27 DIAGNOSIS — R109 Unspecified abdominal pain: Secondary | ICD-10-CM

## 2014-05-27 DIAGNOSIS — I10 Essential (primary) hypertension: Secondary | ICD-10-CM | POA: Insufficient documentation

## 2014-05-27 DIAGNOSIS — D649 Anemia, unspecified: Secondary | ICD-10-CM | POA: Insufficient documentation

## 2014-05-27 DIAGNOSIS — Z79899 Other long term (current) drug therapy: Secondary | ICD-10-CM | POA: Insufficient documentation

## 2014-05-27 DIAGNOSIS — J45909 Unspecified asthma, uncomplicated: Secondary | ICD-10-CM | POA: Insufficient documentation

## 2014-05-27 DIAGNOSIS — R1013 Epigastric pain: Secondary | ICD-10-CM | POA: Insufficient documentation

## 2014-05-27 DIAGNOSIS — Z720 Tobacco use: Secondary | ICD-10-CM | POA: Insufficient documentation

## 2014-05-27 DIAGNOSIS — F419 Anxiety disorder, unspecified: Secondary | ICD-10-CM | POA: Insufficient documentation

## 2014-05-27 DIAGNOSIS — Z8781 Personal history of (healed) traumatic fracture: Secondary | ICD-10-CM | POA: Insufficient documentation

## 2014-05-27 DIAGNOSIS — R1012 Left upper quadrant pain: Secondary | ICD-10-CM | POA: Insufficient documentation

## 2014-05-27 DIAGNOSIS — K219 Gastro-esophageal reflux disease without esophagitis: Secondary | ICD-10-CM | POA: Insufficient documentation

## 2014-05-27 DIAGNOSIS — R011 Cardiac murmur, unspecified: Secondary | ICD-10-CM | POA: Insufficient documentation

## 2014-05-27 DIAGNOSIS — R079 Chest pain, unspecified: Secondary | ICD-10-CM

## 2014-05-27 DIAGNOSIS — F329 Major depressive disorder, single episode, unspecified: Secondary | ICD-10-CM | POA: Insufficient documentation

## 2014-05-27 DIAGNOSIS — G8929 Other chronic pain: Secondary | ICD-10-CM

## 2014-05-27 LAB — CBC WITH DIFFERENTIAL/PLATELET
BASOS PCT: 0 % (ref 0–1)
Basophils Absolute: 0 10*3/uL (ref 0.0–0.1)
EOS ABS: 0.2 10*3/uL (ref 0.0–0.7)
Eosinophils Relative: 3 % (ref 0–5)
HEMATOCRIT: 32.5 % — AB (ref 39.0–52.0)
HEMOGLOBIN: 10.8 g/dL — AB (ref 13.0–17.0)
Lymphocytes Relative: 38 % (ref 12–46)
Lymphs Abs: 3.1 10*3/uL (ref 0.7–4.0)
MCH: 29.3 pg (ref 26.0–34.0)
MCHC: 33.2 g/dL (ref 30.0–36.0)
MCV: 88.3 fL (ref 78.0–100.0)
Monocytes Absolute: 0.6 10*3/uL (ref 0.1–1.0)
Monocytes Relative: 8 % (ref 3–12)
Neutro Abs: 4.2 10*3/uL (ref 1.7–7.7)
Neutrophils Relative %: 51 % (ref 43–77)
PLATELETS: 205 10*3/uL (ref 150–400)
RBC: 3.68 MIL/uL — AB (ref 4.22–5.81)
RDW: 15.8 % — AB (ref 11.5–15.5)
WBC: 8.2 10*3/uL (ref 4.0–10.5)

## 2014-05-27 NOTE — ED Provider Notes (Signed)
CSN: [id number]     Arrival date & time    History   First MD Initiated Contact with Patient 05/27/14 2353     Chief Complaint  Patient presents with  . Chest Pain     (Consider location/radiation/quality/duration/timing/severity/associated sxs/prior Treatment) The history is provided by the patient and medical records.    This is a 44 year old male with past medical history significant for hypertension, depression, Wolff-Parkinson-White, hyperlipidemia, depression, chronic pancreatitis, cocaine abuse, presenting to the ED for chest pain and abdominal pain.  Patient states he was seen here over the weekend for similar complaints but was told "everything was fine."  Patient states pain today began around 2245 this evening, begins in his epigastrium and radiates up into the left side of his chest and into his left arm.  Patient states he ate fried chicken which seemed to make his pain worse. He denies any shortness of breath, diaphoresis, nausea, vomiting, numbness, or paresthesias. Patient does have history of pancreatitis, he denies alcohol use today but did drink 3 beers yesterday.  He denies drug use recently.  Had clean heart cath 03/06/13 but is not currently followed by cardiology.  Patient continues to smoke.  Mild tachycardia on arrival, VS otherwise stable.  Past Medical History  Diagnosis Date  . Hypertension   . Asthma   . Pancreatitis   . Cocaine abuse   . Depression   . H/O suicide attempt   . Heart murmur     "when he was little" (03/06/2013)  . Shortness of breath     "can happen at anytime" (03/06/2013)  . Anemia   . H/O hiatal hernia   . GERD (gastroesophageal reflux disease)   . Anxiety   . WPW (Wolff-Parkinson-White syndrome)     [name] 03/06/2013  . High cholesterol   . Migraine     "monthly" (01/30/2014)  . Arthritis     "knees; arms; elbows" (01/30/2014)  . Femoral condyle fracture 03/08/2014    left medial/notes 03/09/2014  . Alcoholism /alcohol abuse     Past Surgical History  Procedure Laterality Date  . Facial fracture surgery Left 1990's    "result of trauma"   . Eye surgery Left 1990's    "result of trauma"   . Left heart catheterization with coronary angiogram Right 03/07/2013    Procedure: LEFT HEART CATHETERIZATION WITH CORONARY ANGIOGRAM;  Surgeon: [name], MD;  Location: [place] [facility];  Service: Cardiovascular;  Laterality: Right;   Family History  Problem Relation Age of Onset  . Hypertension Other   . Coronary artery disease Other    History  Substance Use Topics  . Smoking status: Current Every Day Smoker -- 0.50 packs/day for 30 years    Types: Cigarettes  . Smokeless tobacco: Current User    Types: Chew  . Alcohol Use: 28.2 oz/week    47 Cans of beer per week     Comment: 11/2//2015 "I drink 2, 40oz  beers per day"    Review of Systems  Cardiovascular: Positive for chest pain.  Gastrointestinal: Positive for nausea and abdominal pain.  All other systems reviewed and are negative.     Allergies  Shellfish-derived products and Trazodone and nefazodone  Home Medications   Prior to Admission medications   Medication Sig Start Date End Date Taking? Authorizing Provider  albuterol (PROVENTIL HFA;VENTOLIN HFA) 108 (90 BASE) MCG/ACT inhaler Inhale 2 puffs into the lungs every 6 (six) hours as needed for wheezing or shortness of breath (wheezing).  Historical Provider, MD  Alum & Mag Hydroxide-Simeth (GI COCKTAIL) SUSP suspension Take 30 mLs by mouth 2 (two) times daily. Shake well. 05/25/14   Fransico Meadow, PA-C  folic acid (FOLVITE) 1 MG tablet Take 1 tablet (1 mg total) by mouth daily. 03/21/14   Phillips Climes, MD  lipase/protease/amylase (CREON) 12000 UNITS CPEP capsule Take 2 capsules (24,000 Units total) by mouth 3 (three) times daily with meals. 02/06/14   [name], MD  loperamide (IMODIUM) 2 MG capsule Take 1-2 capsules (2-4 mg total) by mouth as needed for diarrhea or loose stools.  01/23/14   [name], PA-C  loratadine (CLARITIN) 10 MG tablet Take 1 tablet (10 mg total) by mouth daily. 02/25/14   [name], PA-C  LORazepam (ATIVAN) 0.5 MG tablet Take 1 tablet (0.5 mg total) by mouth every 8 (eight) hours as needed for anxiety (signs of withdrawels including tremors, anxity, agitation.). 03/21/14   Phillips Climes, MD  metoprolol tartrate (LOPRESSOR) 25 MG tablet Take 1 tablet (25 mg total) by mouth 2 (two) times daily. 02/06/14   [name], MD  Multiple Vitamin (MULTIVITAMIN) capsule Take 1 capsule by mouth daily. 02/06/14   [name], MD  ondansetron (ZOFRAN) 4 MG tablet Take 1 tablet (4 mg total) by mouth every 6 (six) hours. 05/23/14   Verl Dicker, PA-C  oxyCODONE (OXY IR/ROXICODONE) 5 MG immediate release tablet Take 1 tablet (5 mg total) by mouth every 4 (four) hours as needed for moderate pain. 03/21/14   Dawood Elgergawy, MD  pantoprazole (PROTONIX) 20 MG tablet Take 1 tablet (20 mg total) by mouth daily. 05/23/14   Viona Gilmore Cartner, PA-C  promethazine (PHENERGAN) 25 MG tablet Take 1 tablet (25 mg total) by mouth every 6 (six) hours as needed for nausea or vomiting. 02/25/14   [name], PA-C  QUEtiapine (SEROQUEL) 100 MG tablet Take 1 tablet (100 mg total) by mouth at bedtime. For mood control 01/15/14   Jonetta Osgood, MD  sertraline (ZOLOFT) 25 MG tablet Take 75 mg by mouth daily.    Historical Provider, MD  sucralfate (CARAFATE) 1 G tablet Take 1 tablet (1 g total) by mouth 4 (four) times daily -  with meals and at bedtime. 05/23/14   Verl Dicker, PA-C  thiamine 100 MG tablet Take 100 mg by mouth daily.     Historical Provider, MD   BP 148/98 mmHg  Pulse 105  Temp(Src) 98.8 F (37.1 C) (Oral)  Resp 18  Ht 5\' 9"  (1.753 m)  Wt 140 lb (63.504 kg)  BMI 20.67 kg/m2  SpO2 100%   Physical Exam  Constitutional: He is oriented to person, place, and time. He appears well-developed and well-nourished. No distress.   Talking on phone, no acute distress  HENT:  Head: Normocephalic and atraumatic.  Mouth/Throat: Oropharynx is clear and moist.  Eyes: Conjunctivae and EOM are normal. Pupils are equal, round, and reactive to light.  Pupils pinpoint but reactive  Neck: Normal range of motion. Neck supple.  Cardiovascular: Normal rate, regular rhythm and normal heart sounds.   Pulmonary/Chest: Effort normal and breath sounds normal. No respiratory distress. He has no wheezes.  Abdominal: Soft. Bowel sounds are normal. There is tenderness in the epigastric area and left upper quadrant. There is no guarding.  Abdomen soft, nondistended, mild tenderness in left upper quadrant and epigastrium without rebound or guarding  Musculoskeletal: Normal range of motion.  Neurological: He is alert and oriented to  person, place, and time.  Skin: Skin is warm and dry. He is not diaphoretic.  Psychiatric: He has a normal mood and affect.  Nursing note and vitals reviewed.   ED Course  Procedures (including critical care time) Labs Review Labs Reviewed  CBC WITH DIFFERENTIAL - Abnormal; Notable for the following:    RBC 3.68 (*)    Hemoglobin 10.8 (*)    HCT 32.5 (*)    RDW 15.8 (*)    All other components within normal limits  COMPREHENSIVE METABOLIC PANEL - Abnormal; Notable for the following:    AST 57 (*)    All other components within normal limits  TROPONIN I  LIPASE, BLOOD  ETHANOL    Imaging Review Dg Chest 2 View  05/27/2014   CLINICAL DATA:  LEFT chest pain, shortness of breath and irregular heart beat.  EXAM: CHEST  2 VIEW  COMPARISON:  Chest radiograph February 28, 2014  FINDINGS: Cardiomediastinal silhouette is unremarkable. The lungs are clear without pleural effusions or focal consolidations. Trachea projects midline and there is no pneumothorax. Soft tissue planes and included osseous structures are non-suspicious.  IMPRESSION: Normal chest.   Electronically Signed   By: Elon Alas   On:  05/27/2014 23:55     EKG Interpretation   Date/Time:  Tuesday May 27 2014 23:22:28 EST Ventricular Rate:  102 PR Interval:  99 QRS Duration: 78 QT Interval:  320 QTC Calculation: 417 R Axis:   73 Text Interpretation:  Sinus tachycardia Probable left atrial enlargement  non-spec t wave changes No significant change since last tracing Confirmed  by HARRISON  MD, FORREST ([id number]) on 05/27/2014 11:58:05 PM      MDM   Final diagnoses:  Chronic abdominal pain   44 year old male with epigastric abdominal pain with radiation into chest and left arm. Patient has multiple ED visits for similar complaints, most recent was 2 days ago with a negative workup at that time. Patient afebrile and nontoxic on exam. Mild tenderness in his left upper quadrant epigastrium without rebound or guarding. He does not have a surgical abdomen at this time.  EKG with mild tachycardia, no acute ischemia. Lab work is reassuring--no evidence of pancreatitis today. Chest x-ray is clear.  His complaints appears chronic in nature, at this time I low suspicion for ACS, PE, dissection, or other acute cardiac event. I had a long discussion with patient regarding his chronic abdominal pain and that continued alcohol use is likely not helping his condition. Patient was given a single dose of morphine, zofran, fluids, and discharged home in stable condition.  He was encouraged to follow-up with his primary care physician.  Discussed plan with patient, he/she acknowledged understanding and agreed with plan of care.  Return precautions given for new or worsening symptoms.  Case discussed with attending physician, Dr. Aline Brochure, who agrees with assessment and plan of care.  Larene Pickett, PA-C 05/28/14 [id number]  Pamella Pert, MD 05/28/14 [phone]
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
## 2014-05-28 LAB — COMPREHENSIVE METABOLIC PANEL
ALK PHOS: 93 U/L (ref 39–117)
ALT: 42 U/L (ref 0–53)
AST: 57 U/L — ABNORMAL HIGH (ref 0–37)
Albumin: 3.9 g/dL (ref 3.5–5.2)
Anion gap: 9 (ref 5–15)
BUN: 8 mg/dL (ref 6–23)
CO2: 26 mmol/L (ref 19–32)
Calcium: 9 mg/dL (ref 8.4–10.5)
Chloride: 103 mEq/L (ref 96–112)
Creatinine, Ser: 0.67 mg/dL (ref 0.50–1.35)
GFR calc Af Amer: >90 mL/min (ref >90)
GFR calc non Af Amer: >90 mL/min (ref >90)
Glucose, Bld: 97 mg/dL (ref 70–99)
POTASSIUM: 4 mmol/L (ref 3.5–5.1)
SODIUM: 138 mmol/L (ref 135–145)
TOTAL PROTEIN: 7.2 g/dL (ref 6.0–8.3)
Total Bilirubin: 0.6 mg/dL (ref 0.3–1.2)

## 2014-05-28 LAB — TROPONIN I: Troponin I: <0.03 ng/mL (ref <0.031)

## 2014-05-28 LAB — LIPASE, BLOOD: Lipase: 36 U/L (ref 11–59)

## 2014-05-28 MED ORDER — SODIUM CHLORIDE 0.9 % IV BOLUS (SEPSIS)
1000.0000 mL | Freq: Once | INTRAVENOUS | Status: AC
  Administered 2014-05-28: 1000 mL via INTRAVENOUS

## 2014-05-28 MED ORDER — ONDANSETRON HCL 4 MG/2ML IJ SOLN
4.0000 mg | Freq: Once | INTRAMUSCULAR | Status: AC
  Administered 2014-05-28: 4 mg via INTRAVENOUS
  Filled 2014-05-28: qty 2

## 2014-05-28 MED ORDER — MORPHINE SULFATE 4 MG/ML IJ SOLN
4.0000 mg | Freq: Once | INTRAMUSCULAR | Status: AC
  Administered 2014-05-28: 4 mg via INTRAVENOUS
  Filled 2014-05-28: qty 1

## 2014-05-28 NOTE — Discharge Instructions (Signed)
Stop drinking alcohol, this is going to continue causing abdominal pain with your chronic pancreatitis. Follow-up with your primary care physician. Return to the ED for new concerns.

## 2014-05-28 NOTE — ED Notes (Signed)
Contacted Lab to add ethanol.

## 2014-07-04 IMAGING — US US ABDOMEN COMPLETE
1 series · 13 of 25 positions shown · non-contrast
Comparison: Abdominal CT 08/18/2010.  Abdominal ultrasound
05/05/2010.

CLINICAL DATA: Mid abdominal pain with nausea for 2 days.  History
of pancreatitis.

COMPLETE ABDOMINAL ULTRASOUND

[Series 1: us abdomen complete · 0.26mm/px · 13 of 106 slices shown]
[im 1/106]
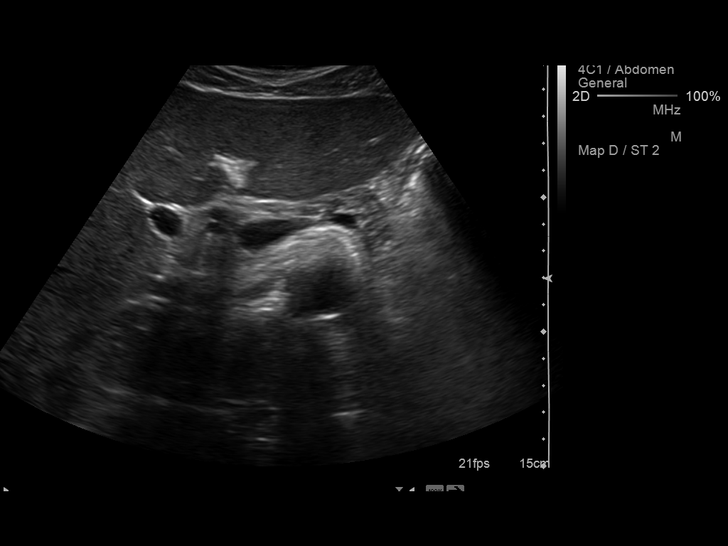
[im 9/106]
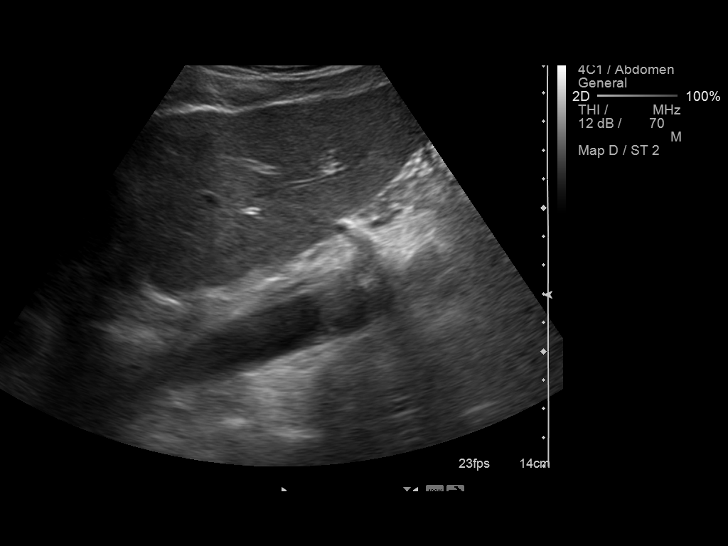
[im 18/106]
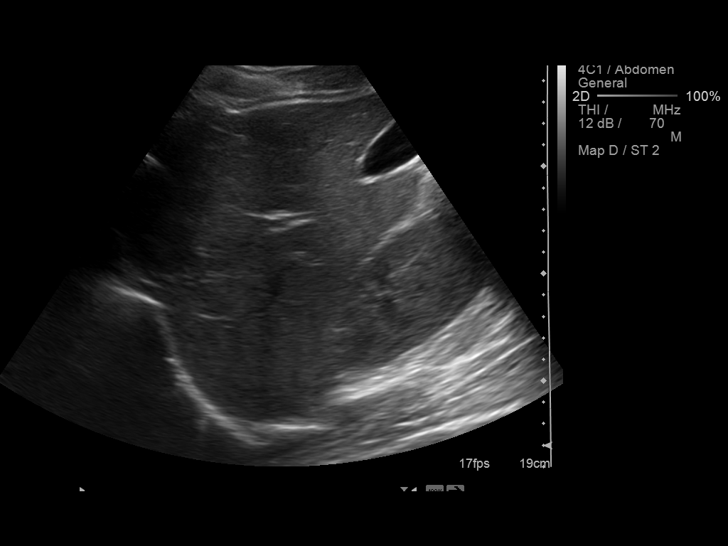
[im 27/106]
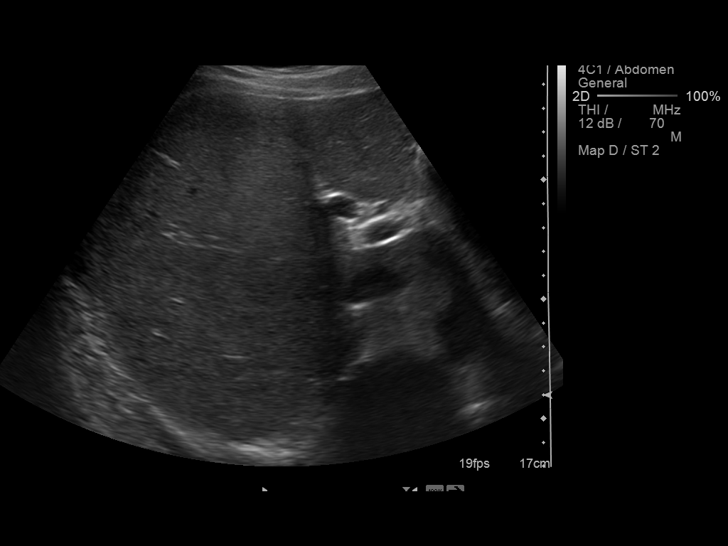
[im 36/106]
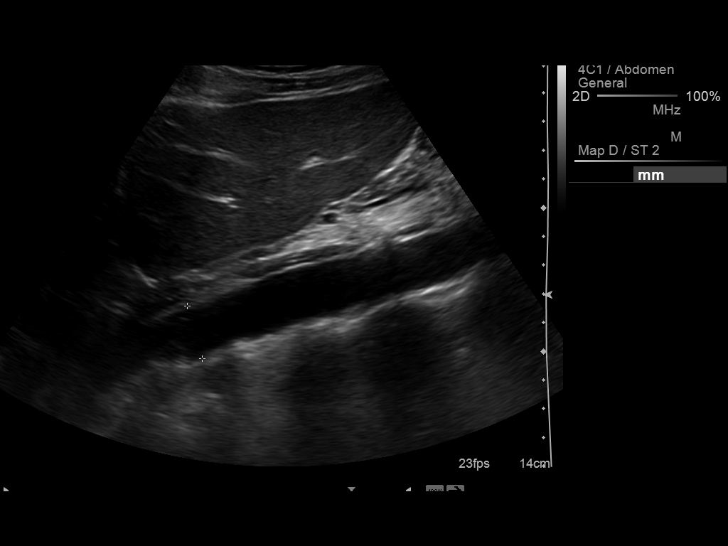
[im 44/106]
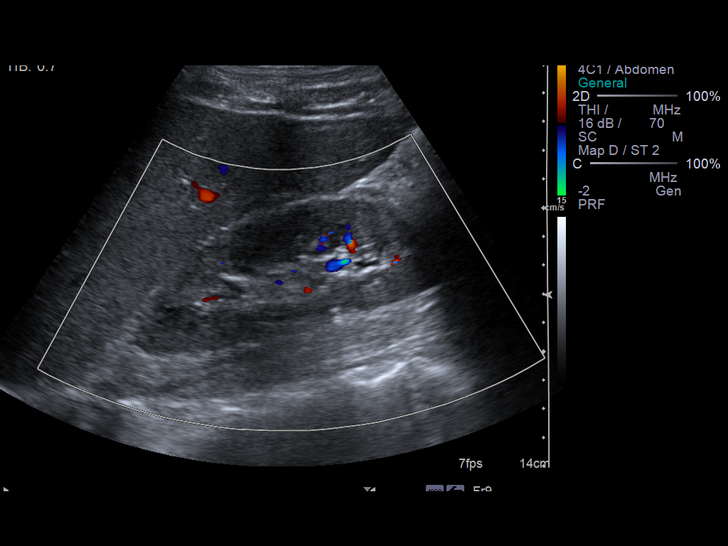
[im 53/106]
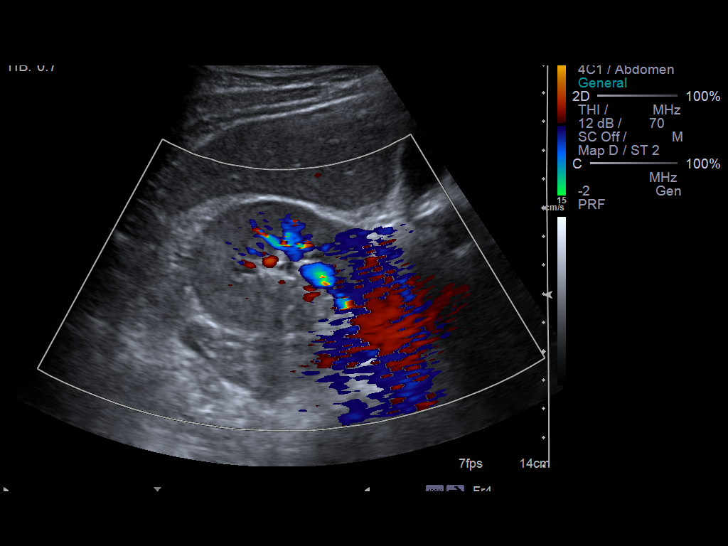
[im 62/106]
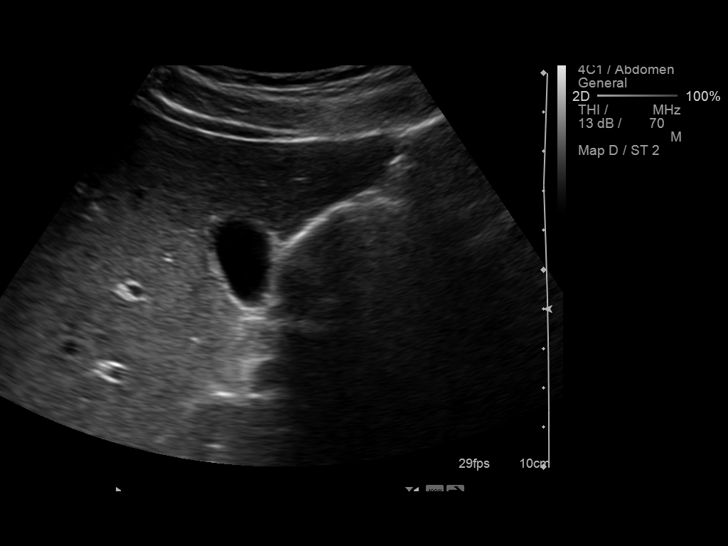
[im 71/106]
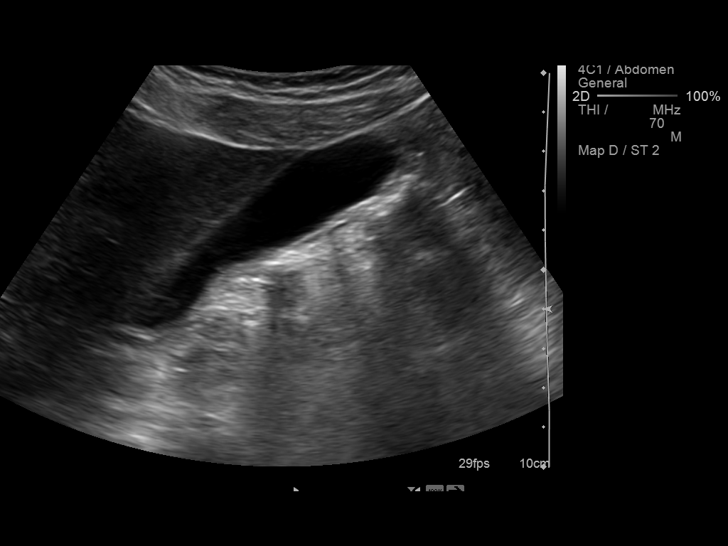
[im 79/106]
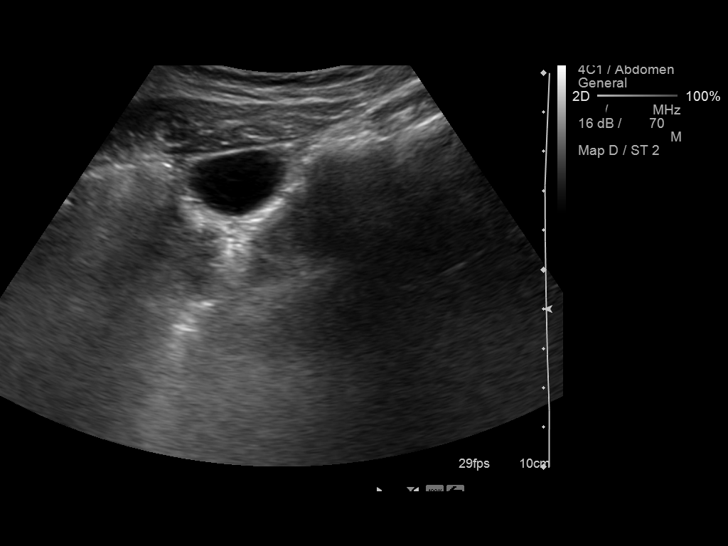
[im 88/106]
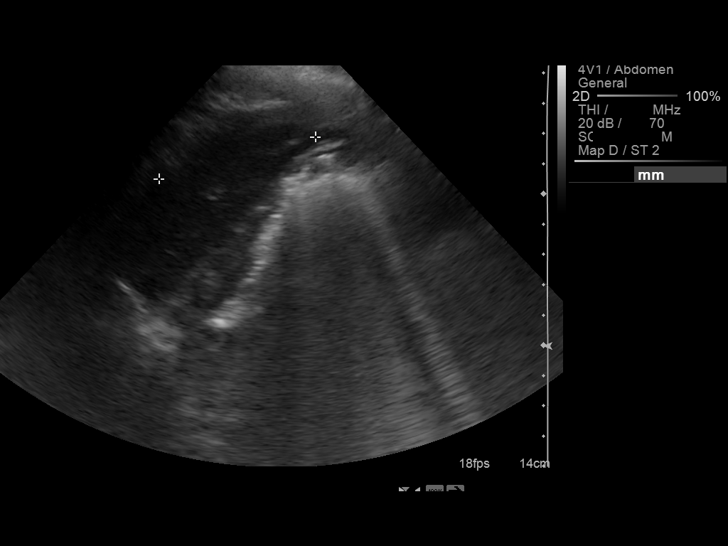
[im 97/106]
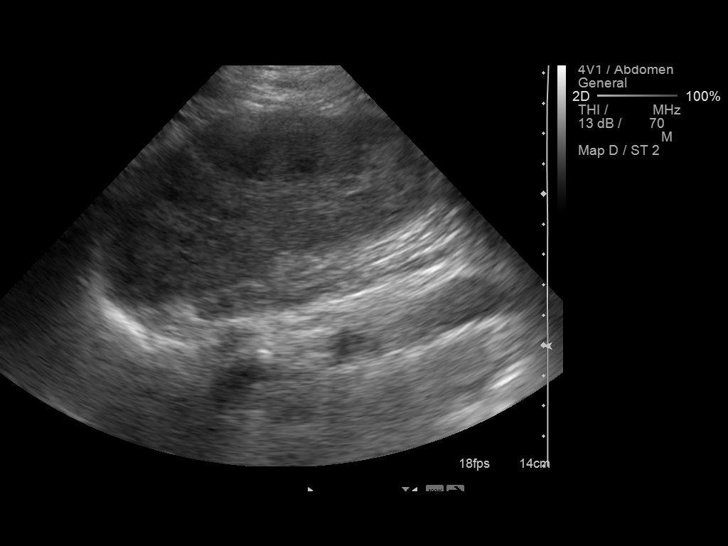
[im 106/106]
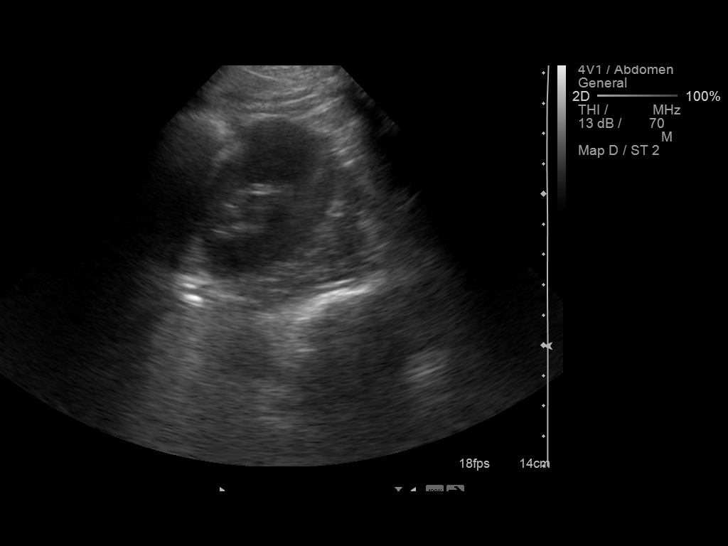

[13 of 25 positions shown; findings below may reference images not displayed]

FINDINGS: Gallbladder: Well distended without wall thickening, stones or
pericholecystic fluid. Negative sonographic Murphy's sign.

Common bile duct:   There is mild fusiform dilatation proximally.
The duct tapers distally.  There is no evidence of
choledocholithiasis.  This appears new compared with the prior
ultrasound.

Liver:  Echogenicity is within normal limits.  No focal hepatic
abnormalities are identified.

IVC:  Visualized portions appear unremarkable.

Pancreas:  Visualized portions appear unremarkable.

Spleen:  Visualized portions appear unremarkable.  The spleen is
suboptimally visualized.

Right Kidney:   The renal cortical thickness and echogenicity are
preserved.  There is no hydronephrosis or focal abnormality. Renal
length is 11.3 cm.

Left Kidney:   The renal cortical thickness and echogenicity are
preserved.  There is no hydronephrosis or focal abnormality. Renal
length is 11.0 cm.

Abdominal aorta:  Visualized portions appear unremarkable.
IMPRESSION: 1.  New mild biliary dilatation without evidence of
choledocholithiasis.  This is nonspecific and could be related to
prior pancreatitis.
2.  The visualized pancreas appears unremarkable.
3.  No evidence of cholelithiasis or cholecystitis.

## 2014-08-08 IMAGING — CT CT HEAD W/O CM
1 of 2 series · 15 of 30 positions shown, 19 images · non-contrast
Comparison: 08/14/2011.

CLINICAL DATA: 41-year-old male status post blunt trauma.  Syncope,
swelling.

CT HEAD WITHOUT CONTRAST
TECHNIQUE: Contiguous axial images were obtained from the base of
the skull through the vertex without contrast.

[Series 3: head trauma 2.4 h60s · axial · 0.46mm/px · z∈[+1102,+1257]mm · 15 of 72 slices shown, 19 images]
[im 4/72  brain]
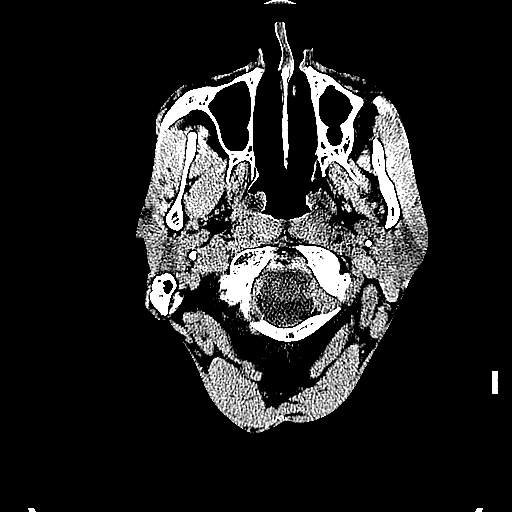
[im 4/72  bone]
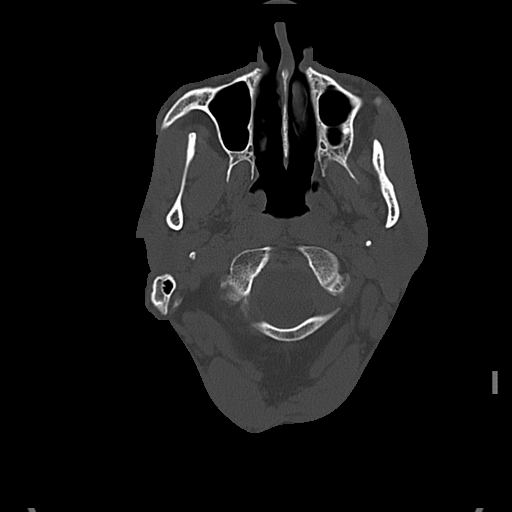
[im 8/72  brain]
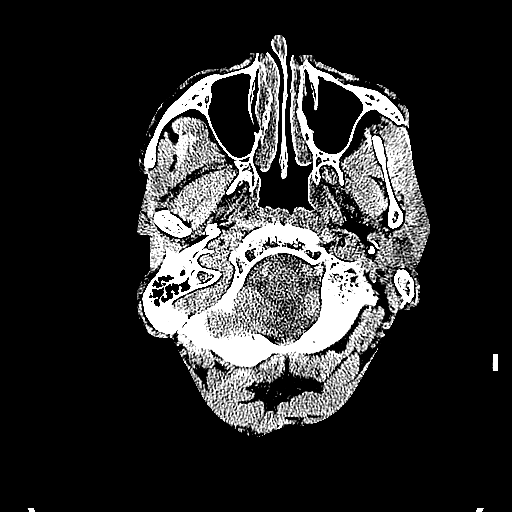
[im 15/72  brain]
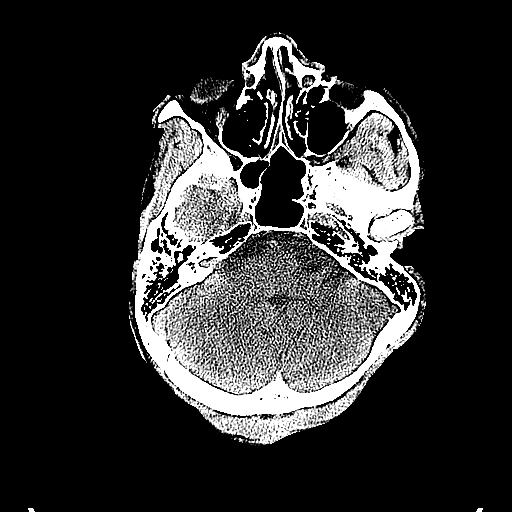
[im 19/72  brain]
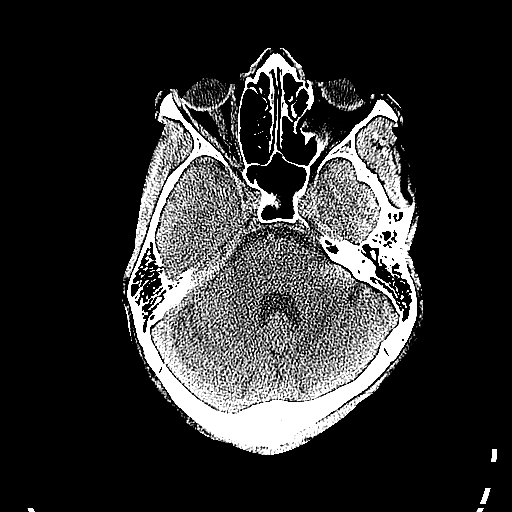
[im 23/72  brain]
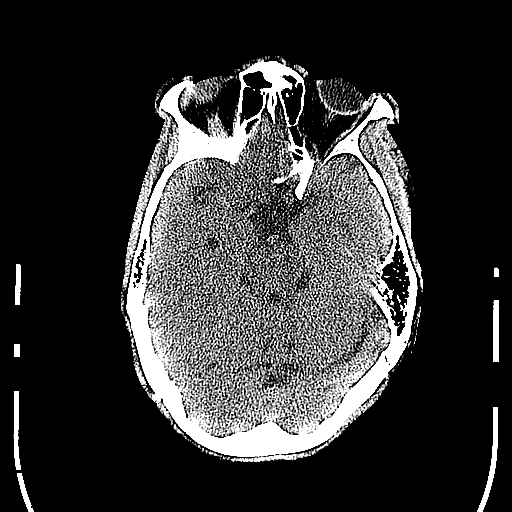
[im 23/72  bone]
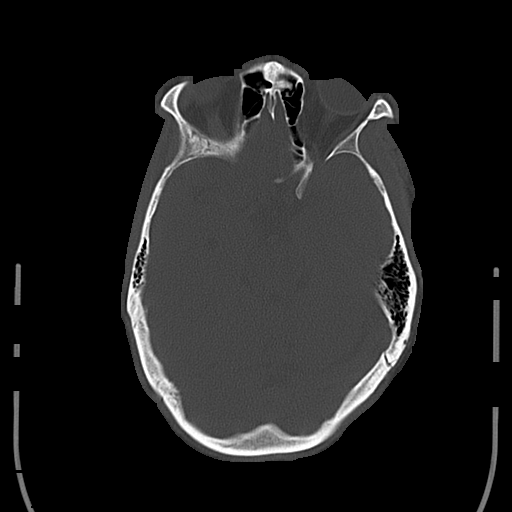
[im 27/72  brain]
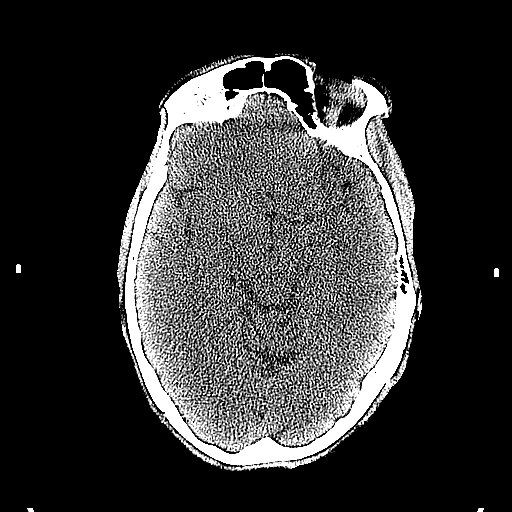
[im 30/72  brain]
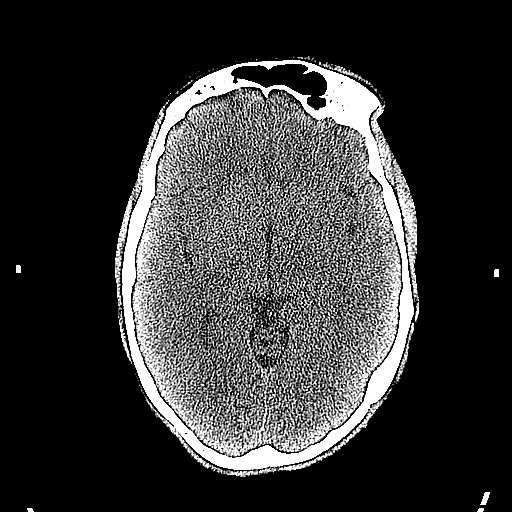
[im 38/72  brain]
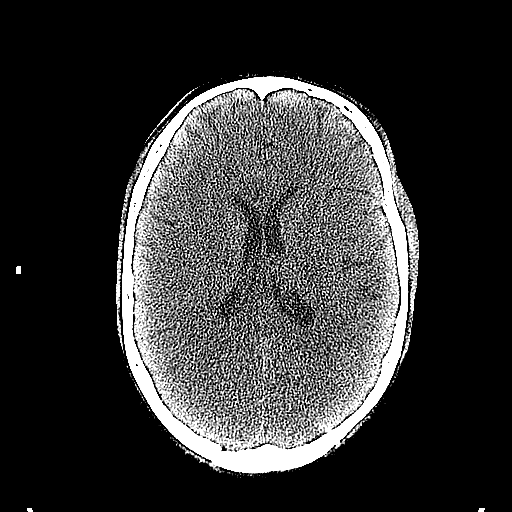
[im 42/72  brain]
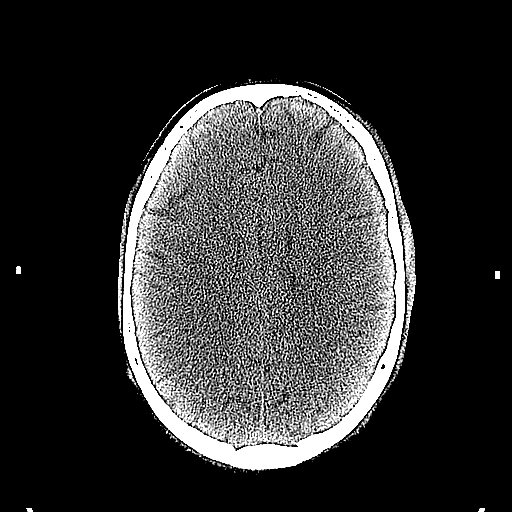
[im 42/72  bone]
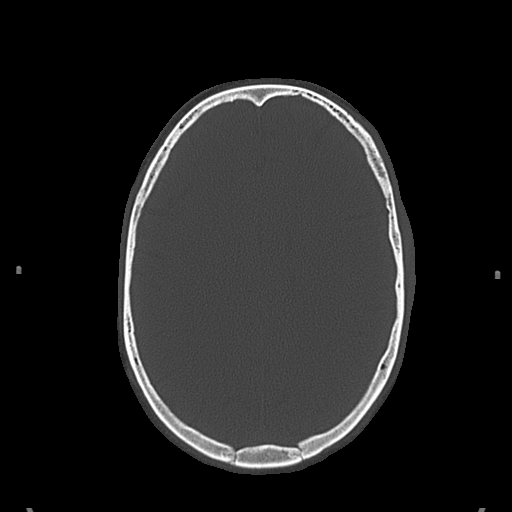
[im 45/72  brain]
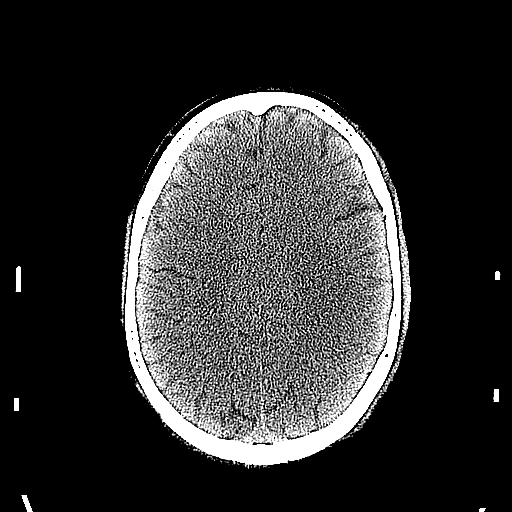
[im 49/72  brain]
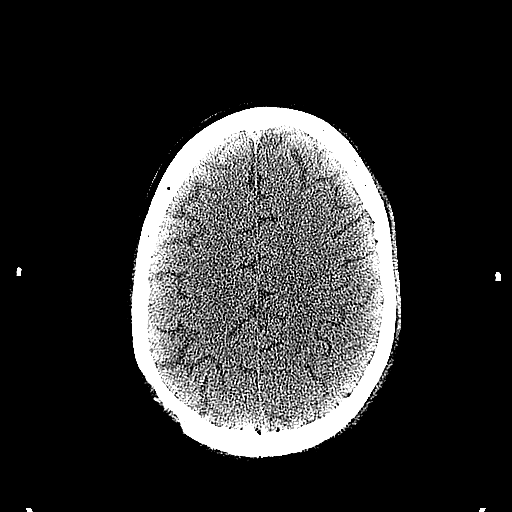
[im 53/72  brain]
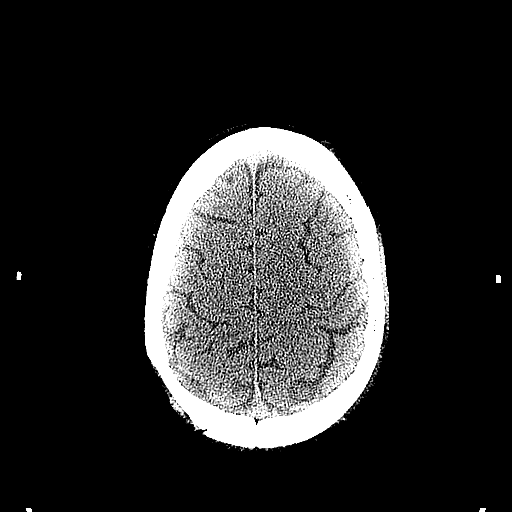
[im 60/72  brain]
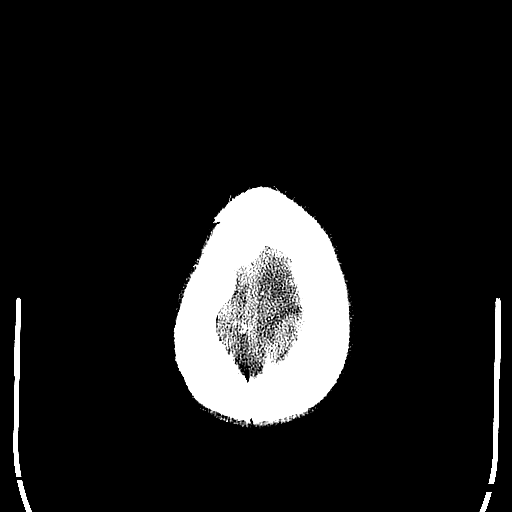
[im 60/72  bone]
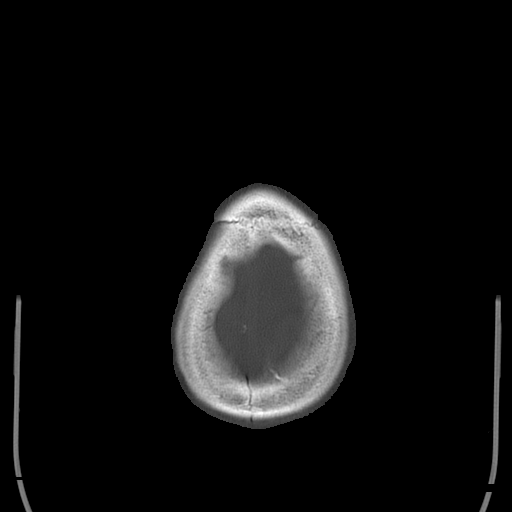
[im 64/72  brain]
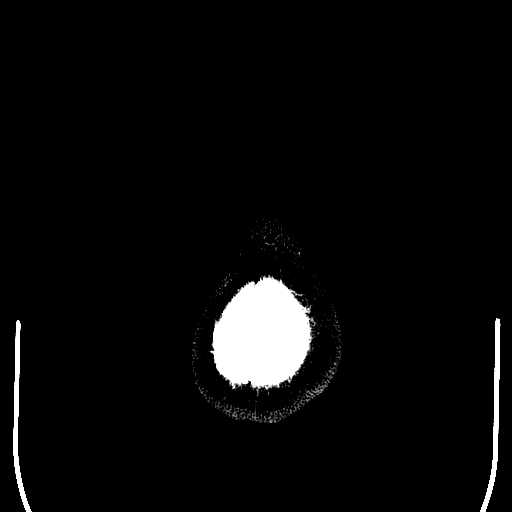
[im 68/72  brain]
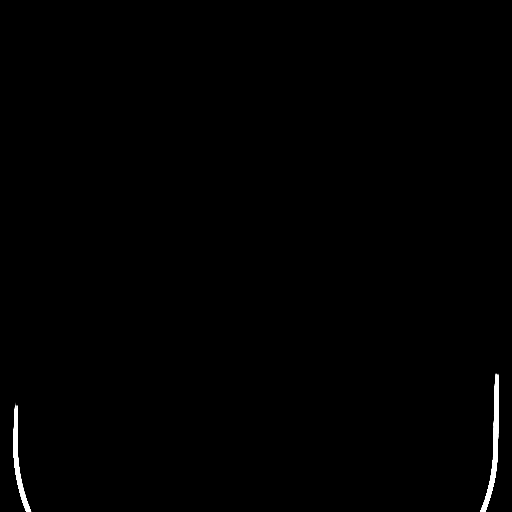

[15 of 30 positions shown; findings below may reference images not displayed]

FINDINGS: Chronic left lamina papyracea fracture.  Previous left
orbital floor fracture repair. Visualized paranasal sinuses and
mastoids are clear.

Scattered areas of chronic scalp soft tissue injury.  No definite
acute scalp hematoma.  Calvarium, stable and intact.  Chronic nasal
bone fractures. Visualized orbit soft tissues are within normal
limits.

Cerebral volume remains normal.  No ventriculomegaly. No midline
shift, mass effect, or evidence of mass lesion.  Gray-white matter
differentiation is within normal limits throughout the brain.  No
evidence of cortically based acute infarction identified.  No acute
intracranial hemorrhage identified.
IMPRESSION: 1. Normal noncontrast CT appearance of the brain.
2.  Chronic post traumatic findings.  No acute traumatic injury
identified.

## 2014-08-19 ENCOUNTER — Encounter (HOSPITAL_COMMUNITY): Payer: Self-pay

## 2014-08-19 ENCOUNTER — Emergency Department (HOSPITAL_COMMUNITY)
Admission: EM | Admit: 2014-08-19 | Discharge: 2014-08-19 | Disposition: A | Discharge status: Home / Self Care | Payer: Self-pay | Attending: Emergency Medicine | Admitting: Emergency Medicine

## 2014-08-19 DIAGNOSIS — Z8719 Personal history of other diseases of the digestive system: Secondary | ICD-10-CM | POA: Insufficient documentation

## 2014-08-19 DIAGNOSIS — M1990 Unspecified osteoarthritis, unspecified site: Secondary | ICD-10-CM | POA: Insufficient documentation

## 2014-08-19 DIAGNOSIS — Z79899 Other long term (current) drug therapy: Secondary | ICD-10-CM | POA: Insufficient documentation

## 2014-08-19 DIAGNOSIS — J45909 Unspecified asthma, uncomplicated: Secondary | ICD-10-CM | POA: Insufficient documentation

## 2014-08-19 DIAGNOSIS — Z8781 Personal history of (healed) traumatic fracture: Secondary | ICD-10-CM | POA: Insufficient documentation

## 2014-08-19 DIAGNOSIS — K2970 Gastritis, unspecified, without bleeding: Secondary | ICD-10-CM | POA: Insufficient documentation

## 2014-08-19 DIAGNOSIS — Z720 Tobacco use: Secondary | ICD-10-CM | POA: Insufficient documentation

## 2014-08-19 DIAGNOSIS — D649 Anemia, unspecified: Secondary | ICD-10-CM | POA: Insufficient documentation

## 2014-08-19 DIAGNOSIS — G8929 Other chronic pain: Secondary | ICD-10-CM | POA: Insufficient documentation

## 2014-08-19 DIAGNOSIS — R011 Cardiac murmur, unspecified: Secondary | ICD-10-CM | POA: Insufficient documentation

## 2014-08-19 DIAGNOSIS — I10 Essential (primary) hypertension: Secondary | ICD-10-CM | POA: Insufficient documentation

## 2014-08-19 DIAGNOSIS — R109 Unspecified abdominal pain: Secondary | ICD-10-CM

## 2014-08-19 DIAGNOSIS — F329 Major depressive disorder, single episode, unspecified: Secondary | ICD-10-CM | POA: Insufficient documentation

## 2014-08-19 DIAGNOSIS — Z8639 Personal history of other endocrine, nutritional and metabolic disease: Secondary | ICD-10-CM | POA: Insufficient documentation

## 2014-08-19 LAB — CBC WITH DIFFERENTIAL/PLATELET
Basophils Absolute: 0 10*3/uL (ref 0.0–0.1)
Basophils Relative: 0 % (ref 0–1)
Eosinophils Absolute: 0 10*3/uL (ref 0.0–0.7)
Eosinophils Relative: 1 % (ref 0–5)
HEMATOCRIT: 36.7 % — AB (ref 39.0–52.0)
HEMOGLOBIN: 12.7 g/dL — AB (ref 13.0–17.0)
LYMPHS ABS: 2.5 10*3/uL (ref 0.7–4.0)
LYMPHS PCT: 31 % (ref 12–46)
MCH: 29.4 pg (ref 26.0–34.0)
MCHC: 34.6 g/dL (ref 30.0–36.0)
MCV: 85 fL (ref 78.0–100.0)
MONO ABS: 0.6 10*3/uL (ref 0.1–1.0)
MONOS PCT: 7 % (ref 3–12)
NEUTROS PCT: 61 % (ref 43–77)
Neutro Abs: 5.1 10*3/uL (ref 1.7–7.7)
Platelets: 213 10*3/uL (ref 150–400)
RBC: 4.32 MIL/uL (ref 4.22–5.81)
RDW: 13.4 % (ref 11.5–15.5)
WBC: 8.3 10*3/uL (ref 4.0–10.5)

## 2014-08-19 LAB — COMPREHENSIVE METABOLIC PANEL
ALK PHOS: 106 U/L (ref 39–117)
ALT: 42 U/L (ref 0–53)
AST: 46 U/L — AB (ref 0–37)
Albumin: 4.3 g/dL (ref 3.5–5.2)
Anion gap: 15 (ref 5–15)
BILIRUBIN TOTAL: 2 mg/dL — AB (ref 0.3–1.2)
BUN: 13 mg/dL (ref 6–23)
CHLORIDE: 100 mmol/L (ref 96–112)
CO2: 24 mmol/L (ref 19–32)
Calcium: 9.4 mg/dL (ref 8.4–10.5)
Creatinine, Ser: 0.84 mg/dL (ref 0.50–1.35)
GFR calc Af Amer: >90 mL/min (ref >90)
GFR calc non Af Amer: >90 mL/min (ref >90)
Glucose, Bld: 87 mg/dL (ref 70–99)
POTASSIUM: 4 mmol/L (ref 3.5–5.1)
Sodium: 139 mmol/L (ref 135–145)
TOTAL PROTEIN: 7.9 g/dL (ref 6.0–8.3)

## 2014-08-19 LAB — LIPASE, BLOOD: Lipase: 26 U/L (ref 11–59)

## 2014-08-19 MED ORDER — SODIUM CHLORIDE 0.9 % IV BOLUS (SEPSIS)
1000.0000 mL | Freq: Once | INTRAVENOUS | Status: AC
  Administered 2014-08-19: 1000 mL via INTRAVENOUS

## 2014-08-19 MED ORDER — PROMETHAZINE HCL 25 MG PO TABS: 25.0000 mg | ORAL_TABLET | Freq: Four times a day (QID) | ORAL | Status: DC | PRN

## 2014-08-19 MED ORDER — MORPHINE SULFATE 4 MG/ML IJ SOLN
4.0000 mg | Freq: Once | INTRAMUSCULAR | Status: AC
  Administered 2014-08-19: 4 mg via INTRAVENOUS
  Filled 2014-08-19: qty 1

## 2014-08-19 MED ORDER — GI COCKTAIL ~~LOC~~
30.0000 mL | Freq: Once | ORAL | Status: AC
  Administered 2014-08-19: 30 mL via ORAL
  Filled 2014-08-19: qty 30

## 2014-08-19 MED ORDER — ONDANSETRON HCL 4 MG/2ML IJ SOLN
4.0000 mg | Freq: Once | INTRAMUSCULAR | Status: AC
  Administered 2014-08-19: 4 mg via INTRAVENOUS
  Filled 2014-08-19: qty 2

## 2014-08-19 MED ORDER — OXYCODONE-ACETAMINOPHEN 5-325 MG PO TABS
2.0000 | ORAL_TABLET | Freq: Once | ORAL | Status: AC
  Administered 2014-08-19: 2 via ORAL
  Filled 2014-08-19: qty 2

## 2014-08-19 MED ORDER — PANTOPRAZOLE SODIUM 20 MG PO TBEC: 20.0000 mg | DELAYED_RELEASE_TABLET | Freq: Every day | ORAL | Status: DC

## 2014-08-19 MED ORDER — SUCRALFATE 1 G PO TABS: 1.0000 g | ORAL_TABLET | Freq: Three times a day (TID) | ORAL | Status: DC

## 2014-08-19 NOTE — ED Notes (Signed)
Pt here for abd pain, onset last night, pt sts he can only keep alcohol down but vomits everything else, pancreatitis hx. Drank etoh last night.

## 2014-08-19 NOTE — Discharge Instructions (Signed)

## 2014-08-19 NOTE — ED Provider Notes (Signed)
CSN: [id number]     Arrival date & time 08/19/14  3614 History   First MD Initiated Contact with Patient 08/19/14 [phone]     Chief Complaint  Patient presents with  . Abdominal Pain      HPI Patient reports a history of gastritis and pancreatitis.  He reports that he did drink alcohol last night.  He had been sober for some time.  He feels that this may have inflamed or irritated his pancreas.  He reports nonbloody nonbilious vomiting.  Denies diarrhea.  No fevers or chills.  Reports mild to moderate upper abdominal discomfort.  Denies back pain or flank pain.   Past Medical History  Diagnosis Date  . Hypertension   . Asthma   . Pancreatitis   . Cocaine abuse   . Depression   . H/O suicide attempt   . Heart murmur     "when he was little" (03/06/2013)  . Shortness of breath     "can happen at anytime" (03/06/2013)  . Anemia   . H/O hiatal hernia   . GERD (gastroesophageal reflux disease)   . Anxiety   . WPW (Wolff-Parkinson-White syndrome)     [name] 03/06/2013  . High cholesterol   . Migraine     "monthly" (01/30/2014)  . Arthritis     "knees; arms; elbows" (01/30/2014)  . Femoral condyle fracture 03/08/2014    left medial/notes 03/09/2014  . Alcoholism /alcohol abuse    Past Surgical History  Procedure Laterality Date  . Facial fracture surgery Left 1990's    "result of trauma"   . Eye surgery Left 1990's    "result of trauma"   . Left heart catheterization with coronary angiogram Right 03/07/2013    Procedure: LEFT HEART CATHETERIZATION WITH CORONARY ANGIOGRAM;  Surgeon: [name], MD;  Location: [facility];  Service: Cardiovascular;  Laterality: Right;   Family History  Problem Relation Age of Onset  . Hypertension Other   . Coronary artery disease Other    History  Substance Use Topics  . Smoking status: Current Every Day Smoker -- 0.50 packs/day for 30 years    Types: Cigarettes  . Smokeless tobacco: Current User    Types: Chew  . Alcohol Use: 28.2  oz/week    47 Cans of beer per week     Comment: 11/2//2015 "I drink 2, 40oz  beers per day"    Review of Systems  All other systems reviewed and are negative.     Allergies  Shellfish-derived products and Trazodone and nefazodone  Home Medications   Prior to Admission medications   Medication Sig Start Date End Date Taking? Authorizing Provider  albuterol (PROVENTIL HFA;VENTOLIN HFA) 108 (90 BASE) MCG/ACT inhaler Inhale 2 puffs into the lungs every 6 (six) hours as needed for wheezing or shortness of breath (wheezing).     Historical Provider, MD  folic acid (FOLVITE) 1 MG tablet Take 1 tablet (1 mg total) by mouth daily. 03/21/14   [name], MD  lipase/protease/amylase (CREON) 12000 UNITS CPEP capsule Take 2 capsules (24,000 Units total) by mouth 3 (three) times daily with meals. 02/06/14   [name], MD  loperamide (IMODIUM) 2 MG capsule Take 1-2 capsules (2-4 mg total) by mouth as needed for diarrhea or loose stools. 01/23/14   [name], PA-C  loratadine (CLARITIN) 10 MG tablet Take 1 tablet (10 mg total) by mouth daily. 02/25/14   [name], PA-C  metoprolol tartrate (LOPRESSOR) 25 MG tablet  Take 1 tablet (25 mg total) by mouth 2 (two) times daily. 02/06/14   [name], MD  Multiple Vitamin (MULTIVITAMIN) capsule Take 1 capsule by mouth daily. 02/06/14   [name], MD  pantoprazole (PROTONIX) 20 MG tablet Take 1 tablet (20 mg total) by mouth daily. 08/19/14   [name], MD  promethazine (PHENERGAN) 25 MG tablet Take 1 tablet (25 mg total) by mouth every 6 (six) hours as needed for nausea or vomiting. 08/19/14   [name], MD  QUEtiapine (SEROQUEL) 100 MG tablet Take 1 tablet (100 mg total) by mouth at bedtime. For mood control 01/15/14   Jonetta Osgood, MD  sertraline (ZOLOFT) 25 MG tablet Take 75 mg by mouth daily.    Historical Provider, MD  sucralfate (CARAFATE) 1 G tablet Take 1 tablet (1 g total) by mouth 4 (four) times  daily -  with meals and at bedtime. 08/19/14   [name], MD  thiamine 100 MG tablet Take 100 mg by mouth daily.     Historical Provider, MD   BP 125/62 mmHg  Pulse 110  Temp(Src) 98.6 F (37 C) (Oral)  Resp 20  Ht 5\' 9"  (1.753 m)  Wt 148 lb (67.132 kg)  BMI 21.85 kg/m2  SpO2 96% Physical Exam  Constitutional: He is oriented to person, place, and time. He appears well-developed and well-nourished.  HENT:  Head: Normocephalic and atraumatic.  Eyes: EOM are normal.  Neck: Normal range of motion.  Cardiovascular: Normal rate, regular rhythm, normal heart sounds and intact distal pulses.   Pulmonary/Chest: Effort normal and breath sounds normal. No respiratory distress.  Abdominal: Soft. He exhibits no distension.  Mild epigastric tenderness without guarding or rebound.  Musculoskeletal: Normal range of motion.  Neurological: He is alert and oriented to person, place, and time.  Skin: Skin is warm and dry.  Psychiatric: He has a normal mood and affect. Judgment normal.  Nursing note and vitals reviewed.   ED Course  Procedures (including critical care time) Labs Review Labs Reviewed  CBC WITH DIFFERENTIAL/PLATELET - Abnormal; Notable for the following:    Hemoglobin 12.7 (*)    HCT 36.7 (*)    All other components within normal limits  COMPREHENSIVE METABOLIC PANEL - Abnormal; Notable for the following:    AST 46 (*)    Total Bilirubin 2.0 (*)    All other components within normal limits  LIPASE, BLOOD    Imaging Review No results found.   EKG Interpretation None      MDM   Final diagnoses:  Gastritis  Chronic abdominal pain    Recurrent abdominal pain.  Labs without significant abnormality.  Vital signs are normal.  Pain improving in the ER.  Keeping oral fluids and pills down.  Discharge home with nausea medication.  Home with prescriptions for protonix and Carafate    [name], MD 08/19/14 1123

## 2014-08-26 ENCOUNTER — Emergency Department (HOSPITAL_COMMUNITY)
Admission: EM | Admit: 2014-08-26 | Discharge: 2014-08-26 | Disposition: A | Discharge status: Home / Self Care | Payer: Self-pay | Attending: Emergency Medicine | Admitting: Emergency Medicine

## 2014-08-26 ENCOUNTER — Encounter (HOSPITAL_COMMUNITY): Payer: Self-pay | Admitting: Neurology

## 2014-08-26 DIAGNOSIS — Z8639 Personal history of other endocrine, nutritional and metabolic disease: Secondary | ICD-10-CM | POA: Insufficient documentation

## 2014-08-26 DIAGNOSIS — J45909 Unspecified asthma, uncomplicated: Secondary | ICD-10-CM | POA: Insufficient documentation

## 2014-08-26 DIAGNOSIS — I10 Essential (primary) hypertension: Secondary | ICD-10-CM | POA: Insufficient documentation

## 2014-08-26 DIAGNOSIS — R1013 Epigastric pain: Secondary | ICD-10-CM

## 2014-08-26 DIAGNOSIS — F329 Major depressive disorder, single episode, unspecified: Secondary | ICD-10-CM | POA: Insufficient documentation

## 2014-08-26 DIAGNOSIS — K0889 Other specified disorders of teeth and supporting structures: Secondary | ICD-10-CM

## 2014-08-26 DIAGNOSIS — R000 Tachycardia, unspecified: Secondary | ICD-10-CM | POA: Insufficient documentation

## 2014-08-26 DIAGNOSIS — D649 Anemia, unspecified: Secondary | ICD-10-CM | POA: Insufficient documentation

## 2014-08-26 DIAGNOSIS — K0381 Cracked tooth: Secondary | ICD-10-CM | POA: Insufficient documentation

## 2014-08-26 DIAGNOSIS — Z720 Tobacco use: Secondary | ICD-10-CM | POA: Insufficient documentation

## 2014-08-26 DIAGNOSIS — G8929 Other chronic pain: Secondary | ICD-10-CM

## 2014-08-26 DIAGNOSIS — F419 Anxiety disorder, unspecified: Secondary | ICD-10-CM | POA: Insufficient documentation

## 2014-08-26 DIAGNOSIS — R011 Cardiac murmur, unspecified: Secondary | ICD-10-CM | POA: Insufficient documentation

## 2014-08-26 DIAGNOSIS — Z79899 Other long term (current) drug therapy: Secondary | ICD-10-CM | POA: Insufficient documentation

## 2014-08-26 DIAGNOSIS — K219 Gastro-esophageal reflux disease without esophagitis: Secondary | ICD-10-CM | POA: Insufficient documentation

## 2014-08-26 DIAGNOSIS — K2970 Gastritis, unspecified, without bleeding: Secondary | ICD-10-CM | POA: Insufficient documentation

## 2014-08-26 DIAGNOSIS — K088 Other specified disorders of teeth and supporting structures: Secondary | ICD-10-CM | POA: Insufficient documentation

## 2014-08-26 LAB — COMPREHENSIVE METABOLIC PANEL
ALBUMIN: 4.3 g/dL (ref 3.5–5.2)
ALK PHOS: 107 U/L (ref 39–117)
ALT: 37 U/L (ref 0–53)
AST: 44 U/L — AB (ref 0–37)
Anion gap: 13 (ref 5–15)
BUN: 8 mg/dL (ref 6–23)
CO2: 21 mmol/L (ref 19–32)
Calcium: 9.3 mg/dL (ref 8.4–10.5)
Chloride: 103 mmol/L (ref 96–112)
Creatinine, Ser: 0.76 mg/dL (ref 0.50–1.35)
GFR calc Af Amer: >90 mL/min (ref >90)
Glucose, Bld: 78 mg/dL (ref 70–99)
POTASSIUM: 3.9 mmol/L (ref 3.5–5.1)
Sodium: 137 mmol/L (ref 135–145)
Total Bilirubin: 1.6 mg/dL — ABNORMAL HIGH (ref 0.3–1.2)
Total Protein: 8 g/dL (ref 6.0–8.3)

## 2014-08-26 LAB — CBC WITH DIFFERENTIAL/PLATELET
Basophils Absolute: 0 10*3/uL (ref 0.0–0.1)
Basophils Relative: 0 % (ref 0–1)
Eosinophils Absolute: 0.1 10*3/uL (ref 0.0–0.7)
Eosinophils Relative: 1 % (ref 0–5)
HCT: 38.4 % — ABNORMAL LOW (ref 39.0–52.0)
Hemoglobin: 12.2 g/dL — ABNORMAL LOW (ref 13.0–17.0)
Lymphocytes Relative: 36 % (ref 12–46)
Lymphs Abs: 2.6 10*3/uL (ref 0.7–4.0)
MCH: 28.7 pg (ref 26.0–34.0)
MCHC: 31.8 g/dL (ref 30.0–36.0)
MCV: 90.4 fL (ref 78.0–100.0)
Monocytes Absolute: 0.5 10*3/uL (ref 0.1–1.0)
Monocytes Relative: 7 % (ref 3–12)
Neutro Abs: 4 10*3/uL (ref 1.7–7.7)
Neutrophils Relative %: 56 % (ref 43–77)
Platelets: 211 10*3/uL (ref 150–400)
RBC: 4.25 MIL/uL (ref 4.22–5.81)
RDW: 15.7 % — ABNORMAL HIGH (ref 11.5–15.5)
WBC: 7.1 10*3/uL (ref 4.0–10.5)

## 2014-08-26 LAB — LIPASE, BLOOD: Lipase: 19 U/L (ref 11–59)

## 2014-08-26 MED ORDER — SODIUM CHLORIDE 0.9 % IV BOLUS (SEPSIS)
1000.0000 mL | Freq: Once | INTRAVENOUS | Status: AC
  Administered 2014-08-26: 1000 mL via INTRAVENOUS

## 2014-08-26 MED ORDER — DICYCLOMINE HCL 10 MG PO CAPS
10.0000 mg | ORAL_CAPSULE | Freq: Once | ORAL | Status: AC
  Administered 2014-08-26: 10 mg via ORAL
  Filled 2014-08-26: qty 1

## 2014-08-26 MED ORDER — ONDANSETRON HCL 4 MG/2ML IJ SOLN
4.0000 mg | Freq: Once | INTRAMUSCULAR | Status: AC
Start: 2014-08-26 — End: 2014-08-26
  Administered 2014-08-26: 4 mg via INTRAVENOUS
  Filled 2014-08-26: qty 2

## 2014-08-26 MED ORDER — OMEPRAZOLE 20 MG PO CPDR: 20.0000 mg | DELAYED_RELEASE_CAPSULE | Freq: Every day | ORAL | Status: DC

## 2014-08-26 MED ORDER — GI COCKTAIL ~~LOC~~
30.0000 mL | Freq: Once | ORAL | Status: AC
  Administered 2014-08-26: 30 mL via ORAL
  Filled 2014-08-26: qty 30

## 2014-08-26 MED ORDER — ONDANSETRON 4 MG PO TBDP: 4.0000 mg | ORAL_TABLET | Freq: Three times a day (TID) | ORAL | Status: DC | PRN

## 2014-08-26 MED ORDER — DICYCLOMINE HCL 20 MG PO TABS: 20.0000 mg | ORAL_TABLET | Freq: Two times a day (BID) | ORAL | Status: DC

## 2014-08-26 MED ORDER — PENICILLIN V POTASSIUM 500 MG PO TABS: 500.0000 mg | ORAL_TABLET | Freq: Four times a day (QID) | ORAL | Status: DC

## 2014-08-26 NOTE — ED Notes (Signed)
Pt called again in main ED waiting area with no response; Raquel Sarna, RN (NF) aware

## 2014-08-26 NOTE — ED Provider Notes (Signed)
CSN: [id number]     Arrival date & time 08/26/14  1321 History   First MD Initiated Contact with Patient 08/26/14 1606     Chief Complaint  Patient presents with  . Abdominal Pain   [name] is a 44 y.o. male with a history of gastritis and chronic pancreatitis, who presents to the ED complaining of epigastric and left upper quadrant pain worse since early this morning. He reports he thinks his pancreatitis is acting up. The patient reports he has been drinking alcohol again and this aggravates his pancreatitis. Is complaining of 7 out of 10 achy, sharp left epigastric pain associated with nausea and vomiting. He reports he's vomited twice today. He reports nonbilious, nonbloody vomiting.  He also reports having diarrhea today. He reports 6 episodes of diarrhea. He also is complaining of a right upper cracked molar that is causing him pain. He denies discharge from his mouth or trouble swallowing. Patient reports taking Tylenol without relief today for his pain. Patient denies previous abdominal surgeries. Patient reports he last drank alcohol at 8:30 this morning. The patient denies fevers, chills, hematemesis, hematochezia, urinary symptoms, rashes, sore throat, trouble swallowing.  (Consider location/radiation/quality/duration/timing/severity/associated sxs/prior Treatment) HPI  Past Medical History  Diagnosis Date  . Hypertension   . Asthma   . Pancreatitis   . Cocaine abuse   . Depression   . H/O suicide attempt   . Heart murmur     "when he was little" (03/06/2013)  . Shortness of breath     "can happen at anytime" (03/06/2013)  . Anemia   . H/O hiatal hernia   . GERD (gastroesophageal reflux disease)   . Anxiety   . WPW (Wolff-Parkinson-White syndrome)     [name] 03/06/2013  . High cholesterol   . Migraine     "monthly" (01/30/2014)  . Arthritis     "knees; arms; elbows" (01/30/2014)  . Femoral condyle fracture 03/08/2014    left medial/notes 03/09/2014  . Alcoholism  /alcohol abuse    Past Surgical History  Procedure Laterality Date  . Facial fracture surgery Left 1990's    "result of trauma"   . Eye surgery Left 1990's    "result of trauma"   . Left heart catheterization with coronary angiogram Right 03/07/2013    Procedure: LEFT HEART CATHETERIZATION WITH CORONARY ANGIOGRAM;  Surgeon: [name], MD;  Location: [facility];  Service: Cardiovascular;  Laterality: Right;   Family History  Problem Relation Age of Onset  . Hypertension Other   . Coronary artery disease Other    History  Substance Use Topics  . Smoking status: Current Every Day Smoker -- 0.50 packs/day for 30 years    Types: Cigarettes  . Smokeless tobacco: Current User    Types: Chew  . Alcohol Use: 28.2 oz/week    47 Cans of beer per week     Comment: 11/2//2015 "I drink 2, 40oz  beers per day"    Review of Systems  Constitutional: Negative for fever and chills.  HENT: Positive for dental problem. Negative for congestion, ear pain, sore throat and trouble swallowing.   Eyes: Negative for pain and visual disturbance.  Respiratory: Negative for cough, shortness of breath and wheezing.   Cardiovascular: Negative for chest pain and palpitations.  Gastrointestinal: Positive for nausea, vomiting, abdominal pain and diarrhea. Negative for blood in stool.  Genitourinary: Negative for dysuria, hematuria and difficulty urinating.  Musculoskeletal: Negative for back pain and neck pain.  Skin: Negative for rash.  Neurological: Negative for weakness, light-headedness, numbness and headaches.      Allergies  Shellfish-derived products and Trazodone and nefazodone  Home Medications   Prior to Admission medications   Medication Sig Start Date End Date Taking? Authorizing Provider  albuterol (PROVENTIL HFA;VENTOLIN HFA) 108 (90 BASE) MCG/ACT inhaler Inhale 2 puffs into the lungs every 6 (six) hours as needed for wheezing or shortness of breath (wheezing).    Yes Historical  Provider, MD  famotidine (PEPCID) 20 MG tablet Take 20 mg by mouth daily.   Yes Historical Provider, MD  lipase/protease/amylase (CREON) 12000 UNITS CPEP capsule Take 2 capsules (24,000 Units total) by mouth 3 (three) times daily with meals. 02/06/14  Yes [name], MD  loperamide (IMODIUM) 2 MG capsule Take 1-2 capsules (2-4 mg total) by mouth as needed for diarrhea or loose stools. 01/23/14  Yes Marianne L York, PA-C  Multiple Vitamin (MULTIVITAMIN) capsule Take 1 capsule by mouth daily. 02/06/14  Yes [name], MD  promethazine (PHENERGAN) 25 MG tablet Take 1 tablet (25 mg total) by mouth every 6 (six) hours as needed for nausea or vomiting. 08/19/14  Yes [name], MD  QUEtiapine (SEROQUEL) 100 MG tablet Take 1 tablet (100 mg total) by mouth at bedtime. For mood control 01/15/14  Yes Shanker Kristeen Mans, MD  sertraline (ZOLOFT) 25 MG tablet Take 75 mg by mouth daily.   Yes Historical Provider, MD  sucralfate (CARAFATE) 1 G tablet Take 1 tablet (1 g total) by mouth 4 (four) times daily -  with meals and at bedtime. 08/19/14  Yes [name], MD  dicyclomine (BENTYL) 20 MG tablet Take 1 tablet (20 mg total) by mouth 2 (two) times daily. 08/26/14   [name], PA-C  folic acid (FOLVITE) 1 MG tablet Take 1 tablet (1 mg total) by mouth daily. Patient not taking: Reported on 08/26/2014 03/21/14   [name], MD  loratadine (CLARITIN) 10 MG tablet Take 1 tablet (10 mg total) by mouth daily. Patient not taking: Reported on 08/26/2014 02/25/14   Melton Alar, PA-C  metoprolol tartrate (LOPRESSOR) 25 MG tablet Take 1 tablet (25 mg total) by mouth 2 (two) times daily. Patient not taking: Reported on 08/26/2014 02/06/14   [name], MD  omeprazole (PRILOSEC) 20 MG capsule Take 1 capsule (20 mg total) by mouth daily. 08/26/14   [name], PA-C  ondansetron (ZOFRAN ODT) 4 MG disintegrating tablet Take 1 tablet (4 mg total) by mouth every 8 (eight) hours as needed for  nausea or vomiting. 08/26/14   [name], PA-C  pantoprazole (PROTONIX) 20 MG tablet Take 1 tablet (20 mg total) by mouth daily. Patient not taking: Reported on 08/26/2014 08/19/14   [name], MD  penicillin v potassium (VEETID) 500 MG tablet Take 1 tablet (500 mg total) by mouth 4 (four) times daily. 08/26/14   [name], PA-C   BP 139/94 mmHg  Pulse 107  Temp(Src) 98.3 F (36.8 C) (Oral)  Resp 20  SpO2 96% Physical Exam  Constitutional: He is oriented to person, place, and time. He appears well-developed and well-nourished. No distress.  Nontoxic appearing.  HENT:  Head: Normocephalic and atraumatic.  Right Ear: External ear normal.  Left Ear: External ear normal.  Nose: Nose normal.  Mouth/Throat: Oropharynx is clear and moist. No oropharyngeal exudate.  Right upper molar is cracked. No abscess. Soft palate rises symmetrically. Uvula is midline without edema. No tonsillar hypertrophy or exudate.  Eyes: Conjunctivae and EOM are normal.  Pupils are equal, round, and reactive to light. Right eye exhibits no discharge. Left eye exhibits no discharge.  Neck: Neck supple. No JVD present.  Cardiovascular: Regular rhythm, normal heart sounds and intact distal pulses.  Exam reveals no gallop and no friction rub.   No murmur heard. Mildly tachycardic at 108.   Pulmonary/Chest: Effort normal and breath sounds normal. No respiratory distress. He has no wheezes. He has no rales.  Abdominal: Soft. Bowel sounds are normal. He exhibits no distension and no mass. There is tenderness. There is no rebound and no guarding.  Moderate left epigastric tenderness to palpation. No RLQ tenderness. No rebound tenderness. Negative psoas and obturator sign.   Musculoskeletal: He exhibits no edema.  He is able to ambulate without difficulty or assistance. Steady gait.   Lymphadenopathy:    He has no cervical adenopathy.  Neurological: He is alert and oriented to person, place, and time. Coordination  normal.  Skin: Skin is warm and dry. No rash noted. He is not diaphoretic. No erythema. No pallor.  Psychiatric: He has a normal mood and affect. His behavior is normal.  Nursing note and vitals reviewed.   ED Course  Procedures (including critical care time) Labs Review Labs Reviewed  CBC WITH DIFFERENTIAL/PLATELET - Abnormal; Notable for the following:    Hemoglobin 12.2 (*)    HCT 38.4 (*)    RDW 15.7 (*)    All other components within normal limits  COMPREHENSIVE METABOLIC PANEL - Abnormal; Notable for the following:    AST 44 (*)    Total Bilirubin 1.6 (*)    All other components within normal limits  LIPASE, BLOOD    Imaging Review No results found.   EKG Interpretation None      Filed Vitals:   08/26/14 1700 08/26/14 1715 08/26/14 1730 08/26/14 1745  BP: 144/85 143/90 140/87 139/94  Pulse: 110 102 106 107  Temp:      TempSrc:      Resp:      SpO2: 98% 99% 98% 96%     MDM   Meds given in ED:  Medications  sodium chloride 0.9 % bolus 1,000 mL (1,000 mLs Intravenous New Bag/Given 08/26/14 1656)  ondansetron (ZOFRAN) injection 4 mg (4 mg Intravenous Given 08/26/14 1656)  gi cocktail (Maalox,Lidocaine,Donnatal) (30 mLs Oral Given 08/26/14 1656)  dicyclomine (BENTYL) capsule 10 mg (10 mg Oral Given 08/26/14 1747)    New Prescriptions   DICYCLOMINE (BENTYL) 20 MG TABLET    Take 1 tablet (20 mg total) by mouth 2 (two) times daily.   OMEPRAZOLE (PRILOSEC) 20 MG CAPSULE    Take 1 capsule (20 mg total) by mouth daily.   ONDANSETRON (ZOFRAN ODT) 4 MG DISINTEGRATING TABLET    Take 1 tablet (4 mg total) by mouth every 8 (eight) hours as needed for nausea or vomiting.   PENICILLIN V POTASSIUM (VEETID) 500 MG TABLET    Take 1 tablet (500 mg total) by mouth 4 (four) times daily.    Final diagnoses:  Gastritis  Abdominal pain, chronic, epigastric  Pain, dental   This is a 44 y.o. male with a history of gastritis and chronic pancreatitis, who presents to the ED  complaining of epigastric and left upper quadrant pain worse since early this morning. He reports he thinks his pancreatitis is acting up. The patient reports he has been drinking alcohol again and this aggravates his pancreatitis. He has recurrent abdominal pain. No peritoneal signs on exam. His lab work is unremarkable.  Lipase is normal. He has been able to keep down oral fluids and pills down in the emergency department. He is afebrile and nontoxic appearing. He has had no vomiting or diarrhea while in the emergency department. He reports his pain has improved well in the emergency department.  Patient also has toothache.  No gross abscess.  Exam unconcerning for Ludwig's angina or spread of infection.  Will treat with penicillin and pain medicine.  Urged patient to follow-up with dentist.  Will discharge with penicillin, Zofran, omeprazole and Bentyl. I advised the patient to follow-up with their primary care provider this week. I advised the patient to return to the emergency department with new or worsening symptoms or new concerns. The patient verbalized understanding and agreement with plan.   This patient was discussed with Dr. Johnney Killian who agrees with assessment and plan.    [name], PA-C 08/26/14 1848  Charlesetta Shanks, MD 08/29/14 [phone]

## 2014-08-26 NOTE — ED Notes (Signed)
Pt reports pancreatitis, c/o left side abd pain. Also broke upper tooth when eating and is painful. Reports v/d. Has been drinking ETOH last night.

## 2014-08-26 NOTE — ED Notes (Signed)
Pt called in main ED waiting area with no response 

## 2014-08-26 NOTE — Discharge Instructions (Signed)
Abdominal Pain °Many things can cause abdominal pain. Usually, abdominal pain is not caused by a disease and will improve without treatment. It can often be observed and treated at home. Your health care provider will do a physical exam and possibly order blood tests and X-rays to help determine the seriousness of your pain. However, in many cases, more time must pass before a clear cause of the pain can be found. Before that point, your health care provider may not know if you need more testing or further treatment. °HOME CARE INSTRUCTIONS  °Monitor your abdominal pain for any changes. The following actions may help to alleviate any discomfort you are experiencing: °· Only take over-the-counter or prescription medicines as directed by your health care provider. °· Do not take laxatives unless directed to do so by your health care provider. °· Try a clear liquid diet (broth, tea, or water) as directed by your health care provider. Slowly move to a bland diet as tolerated. °SEEK MEDICAL CARE IF: °· You have unexplained abdominal pain. °· You have abdominal pain associated with nausea or diarrhea. °· You have pain when you urinate or have a bowel movement. °· You experience abdominal pain that wakes you in the night. °· You have abdominal pain that is worsened or improved by eating food. °· You have abdominal pain that is worsened with eating fatty foods. °· You have a fever. °SEEK IMMEDIATE MEDICAL CARE IF:  °· Your pain does not go away within 2 hours. °· You keep throwing up (vomiting). °· Your pain is felt only in portions of the abdomen, such as the right side or the left lower portion of the abdomen. °· You pass bloody or black tarry stools. °MAKE SURE YOU: °· Understand these instructions.   °· Will watch your condition.   °· Will get help right away if you are not doing well or get worse.   °Document Released: 02/02/2005 Document Revised: 04/30/2013 Document Reviewed: 01/02/2013 °ExitCare® Patient Information  ©2015 [employer]. This information is not intended to replace advice given to you by your health care provider. Make sure you discuss any questions you have with your health care provider. ° °Gastritis, Adult °Gastritis is soreness and swelling (inflammation) of the lining of the stomach. Gastritis can develop as a sudden onset (acute) or long-term (chronic) condition. If gastritis is not treated, it can lead to stomach bleeding and ulcers. °CAUSES  °Gastritis occurs when the stomach lining is weak or damaged. Digestive juices from the stomach then inflame the weakened stomach lining. The stomach lining may be weak or damaged due to viral or bacterial infections. One common bacterial infection is the Helicobacter pylori infection. Gastritis can also result from excessive alcohol consumption, taking certain medicines, or having too much acid in the stomach.  °SYMPTOMS  °In some cases, there are no symptoms. When symptoms are present, they may include: °· Pain or a burning sensation in the upper abdomen. °· Nausea. °· Vomiting. °· An uncomfortable feeling of fullness after eating. °DIAGNOSIS  °Your caregiver may suspect you have gastritis based on your symptoms and a physical exam. To determine the cause of your gastritis, your caregiver may perform the following: °· Blood or stool tests to check for the H pylori bacterium. °· Gastroscopy. A thin, flexible tube (endoscope) is passed down the esophagus and into the stomach. The endoscope has a light and camera on the end. Your caregiver uses the endoscope to view the inside of the stomach. °· Taking a tissue sample (biopsy)   the stomach to examine under a microscope. TREATMENT  Depending on the cause of your gastritis, medicines may be prescribed. If you have a bacterial infection, such as an H pylori infection, antibiotics may be given. If your gastritis is caused by too much acid in the stomach, H2 blockers or antacids may be given. Your caregiver may  recommend that you stop taking aspirin, ibuprofen, or other nonsteroidal anti-inflammatory drugs (NSAIDs). HOME CARE INSTRUCTIONS  Only take over-the-counter or prescription medicines as directed by your caregiver.  If you were given antibiotic medicines, take them as directed. Finish them even if you start to feel better.  Drink enough fluids to keep your urine clear or pale yellow.  Avoid foods and drinks that make your symptoms worse, such as:  Caffeine or alcoholic drinks.  Chocolate.  Peppermint or mint flavorings.  Garlic and onions.  Spicy foods.  Citrus fruits, such as oranges, lemons, or limes.  Tomato-based foods such as sauce, chili, salsa, and pizza.  Fried and fatty foods.  Eat small, frequent meals instead of large meals. SEEK IMMEDIATE MEDICAL CARE IF:   You have black or dark red stools.  You vomit blood or material that looks like coffee grounds.  You are unable to keep fluids down.  Your abdominal pain gets worse.  You have a fever.  You do not feel better after 1 week.  You have any other questions or concerns. MAKE SURE YOU:  Understand these instructions.  Will watch your condition.  Will get help right away if you are not doing well or get worse. Document Released: 04/19/2001 Document Revised: 10/25/2011 Document Reviewed: 06/08/2011 [facility] Patient Information 2015 [place], [place]. This information is not intended to replace advice given to you by your health care provider. Make sure you discuss any questions you have with your health care provider. Dental Pain A tooth ache may be caused by cavities (tooth decay). Cavities expose the nerve of the tooth to air and hot or cold temperatures. It may come from an infection or abscess (also called a boil or furuncle) around your tooth. It is also often caused by dental caries (tooth decay). This causes the pain you are having. DIAGNOSIS  Your caregiver can diagnose this problem by  exam. TREATMENT   If caused by an infection, it may be treated with medications which kill germs (antibiotics) and pain medications as prescribed by your caregiver. Take medications as directed.  Only take over-the-counter or prescription medicines for pain, discomfort, or fever as directed by your caregiver.  Whether the tooth ache today is caused by infection or dental disease, you should see your dentist as soon as possible for further care. SEEK MEDICAL CARE IF: The exam and treatment you received today has been provided on an emergency basis only. This is not a substitute for complete medical or dental care. If your problem worsens or new problems (symptoms) appear, and you are unable to meet with your dentist, call or return to this location. SEEK IMMEDIATE MEDICAL CARE IF:   You have a fever.  You develop redness and swelling of your face, jaw, or neck.  You are unable to open your mouth.  You have severe pain uncontrolled by pain medicine. MAKE SURE YOU:   Understand these instructions.  Will watch your condition.  Will get help right away if you are not doing well or get worse. Document Released: 04/25/2005 Document Revised: 07/18/2011 Document Reviewed: 12/12/2007 [facility] Patient Information 2015 [place], [place]. This information is not intended  to replace advice given to you by your health care provider. Make sure you discuss any questions you have with your health care provider.  Emergency Department Resource Guide 1) Find a Doctor and Pay Out of Pocket Although you won't have to find out who is covered by your insurance plan, it is a good idea to ask around and get recommendations. You will then need to call the office and see if the doctor you have chosen will accept you as a new patient and what types of options they offer for patients who are self-pay. Some doctors offer discounts or will set up payment plans for their patients who do not have insurance, but you will  need to ask so you aren't surprised when you get to your appointment.  2) Contact Your Local Health Department Not all health departments have doctors that can see patients for sick visits, but many do, so it is worth a call to see if yours does. If you don't know where your local health department is, you can check in your phone book. The CDC also has a tool to help you locate your state's health department, and many state websites also have listings of all of their local health departments.  3) Find a [facility] If your illness is not likely to be very severe or complicated, you may want to try a walk in clinic. These are popping up all over the country in pharmacies, drugstores, and shopping centers. They're usually staffed by nurse practitioners or physician assistants that have been trained to treat common illnesses and complaints. They're usually fairly quick and inexpensive. However, if you have serious medical issues or chronic medical problems, these are probably not your best option.  No Primary Care Doctor: - Call Health Connect at  [phone] - they can help you locate a primary care doctor that  accepts your insurance, provides certain services, etc. - Physician Referral Service- [phone]  Chronic Pain Problems: Organization         Address  Phone   Notes  [facility]  [phone] Patients need to be referred by their primary care doctor.   Medication Assistance: Organization         Address  Phone   Notes  [facility] Medication [facility] [address] [place], [place] [address] [phone] --Must be a resident of [facility] -- Must have NO insurance coverage whatsoever (no Medicaid/ Medicare, etc.) -- The pt. MUST have a primary care doctor that directs their care regularly and follows them in the community   MedAssist  [phone]   [employer]  [phone]    Agencies that provide inexpensive medical  care: Organization         Address  Phone   Notes  [place]  [phone]   [facility]    [phone]   [facility] [place], [place] [address] [phone]   [place] [address] [place] [phone]   Planned Parenthood    [phone]   [facility]    [phone]   [place] and [address], [place] Phone:  [phone], Fax:  [phone] Hours of Operation:  9 am - 6 pm, M-F.  Also accepts Medicaid/Medicare and self-pay.  [facility] for [place] [address], [employer] Phone: [phone], Fax: [phone]. Hours of Operation:  8:30 am - 5:30 pm, M-F.  Also accepts Medicaid and self-pay.  [facility] [place] [address] [place] Phone: [phone]   [place], [place], [place] [phone], Ext. [phone] Mondays & Thursdays: 7-9 AM.  First 15 patients are seen on a first come, first serve basis.    [place] Providers:  Organization         Address  Phone   Notes  [facility] [address],  [phone] Also accepts self-pay patients.  [facility] [address], [place]  [phone]   [place], [address], [place] [phone]   [name] [address], [place] [phone]   Lucianne Lei [address], [place]   [phone] Only accepts [place] Access [place] patients after they have their name applied to their card.   Self-Pay (no insurance) in [facility]:  Organization         Address  Phone   Notes  Sickle Cell Patients, [facility] Internal [facility] [place] [phone]   [facility] [place] [phone]   [facility] [place]  [place], [place], [place] [phone]   [facility]/Dr. Osei-Bonsu  [address] [place] or [address], [place] [phone] Phone number for both [place] and [place] locations is the same.  [facility]gent [facility] and [facility] [address], [place] [phone]   [facility] [address], [place] or [address] [phone]   [facility] [address] [place] [phone], phone; [phone], fax Sees patients 1st and 3rd Saturday of every month.  Must not qualify for public or private insurance (i.e. Medicaid, Medicare, [place] Health Choice, Veterans' Benefits)  Household income should be no more than 200% of the poverty level The clinic cannot treat you if you are pregnant or think you are pregnant  Sexually transmitted diseases are not treated at the clinic.    Dental Care: Organization         Address  Phone  Notes  [facility] Department of [facility] [phone] Accepts children up to age 52 who are enrolled in [place] or [place]; pregnant women with a Medicaid card; and children who have applied for Medicaid or [place] Health Choice, but were declined, whose parents can pay a reduced fee at time of service.  [facility] Department of [facility]  [address], [place] [phone] Accepts children up to age 10 who are enrolled in [place] or [place]; pregnant women with a Medicaid card; and children who have applied for Medicaid or  Health Choice, but were declined, whose parents can pay a reduced fee at time of service.  [facility] Adult [facility] Access PROGRAM  [place] [phone] Patients are seen by appointment only. Walk-ins are not accepted. [place] will see patients 36 years of age and older. Monday - Tuesday (8am-5pm) Most Wednesdays (8:30-5pm) $30 per visit, cash only  [place] Adult [facility] Access [facility]  [address],  [place] [phone] Patients are seen by appointment only. Walk-ins are not accepted. [place] will see patients 23 years of age and older. One Wednesday Evening (Monthly: Volunteer Based).  $30 per visit, cash only  [place]  [phone] for adults; Children under age 97, call Graduate Pediatric Dentistry at [phone]. Children aged 56-14, please call [phone] to request a pediatric application.  Dental services are provided in all areas of dental care including fillings, crowns and bridges, complete and partial dentures, implants, gum treatment, root canals, and extractions. Preventive care is also provided. Treatment is provided to both adults and children. Patients are selected via a lottery and there is often a waiting list.   [facility] [address], [place]  [phone] [email or web address]   [facility] [address] [place], [place] [phone], Ext. [phone] Second and Fourth Thursday of each month, opens at 6:30 AM; Clinic ends at 9 AM.  Patients are seen on a first-come first-served basis, and a limited number are seen during each clinic.   [facility]  [address] [place], [place] [phone]   Eligibility Requirements You must have lived in [place], [place], or [place] counties for at least the last three months.   You cannot be eligible for state or federal sponsored [employer], including [employer], [place], or [employer].   You generally cannot be eligible for healthcare insurance through your employer.    How to apply: Eligibility screenings are held every Tuesday and Wednesday afternoon from 1:00 pm until 4:00 pm. You do not need an appointment for the interview!   [facility] [address] [place], [place]   [place]  [place] Department  [place]  [phone]    Behavioral Health Resources in the Community: Intensive Outpatient Programs Organization         Address  Phone  Notes  [place]. [address] [place], [place] [phone]   [facility] [address], [place], [place]   ADS: Alcohol & Drug Svcs [address], [place], [place]   [place] [address]  [place], [place] or [phone]   Substance Abuse Resources Organization         Address  Phone  Notes  Alcohol and Drug Services  [phone]   [place]  [phone]   [facility] [place]   Chinita Pester  [phone]   Residential & Outpatient Substance Abuse Program  [phone]   Psychological Services Organization         Address  Phone  Notes  [facility] [place]  [place]  [phone]   [place] [address] [place] or [phone]    Mobile Crisis Teams Organization         Address  Phone  Notes  [facility], Mobile Crisis Care Unit  [phone]   [facility]  [address] [place], [place]   [place] Levels [address] [place] [phone]    Self-Help/Support Groups Organization         Address  Phone             Notes  [place]. of [place] - variety of support groups  336- H3156881 Call for more information  Narcotics Anonymous (NA), [facility] [address], [employer] [place]  2 meetings at this location   Residential Facilities manager         Address  Phone  Notes  [facility] [place],    [place]  [phone]   [facility]  [address] [place] [address]030, [place], [place]   [place], [place] [phone] Admissions: 8am-3pm M-F  Incentives Substance [place] [address],    [place], [place] [phone]   [facility] [address] [place], [place], [place]   [facility] [address]  [place], [place]   [facility] - Intensive Outpatient [address], [place], [place]   [facility] [place].,  [place], [place] [phone] or [phone]   [facility] ([facility]) [address], [place], [place] Accepts Medicaid  [facility] [place] [address]  [place] [phone] Substance Abuse/Addiction Treatment   [facility] Organization         Address  Phone  Notes  [facility]  [phone]   Domenic Schwab, PhD [address] [place], [place]   [phone] or [phone]   [facility] [place], [place] [phone]   [facility] [address], [place], [place] [phone] Insurance/Medicaid/sponsorship through [facility] and Families [address], [address] [place]                                    [place], [place] [phone] [place] [address], [place] [phone]    Dr. Adele Schilder  [phone]   [facility] Dept. 1) [address] [place] 2) [place] 3)  [place] [address], [place] [phone]  [phone]   [place] [phone] or [phone] (After Hours)

## 2014-09-30 ENCOUNTER — Encounter (HOSPITAL_COMMUNITY): Payer: Self-pay | Admitting: Emergency Medicine

## 2014-09-30 ENCOUNTER — Emergency Department (HOSPITAL_COMMUNITY)
Admission: EM | Admit: 2014-09-30 | Discharge: 2014-10-01 | Disposition: A | Discharge status: Home / Self Care | Payer: Self-pay | Attending: Emergency Medicine | Admitting: Emergency Medicine

## 2014-09-30 DIAGNOSIS — Z8781 Personal history of (healed) traumatic fracture: Secondary | ICD-10-CM | POA: Insufficient documentation

## 2014-09-30 DIAGNOSIS — K860 Alcohol-induced chronic pancreatitis: Secondary | ICD-10-CM | POA: Insufficient documentation

## 2014-09-30 DIAGNOSIS — K219 Gastro-esophageal reflux disease without esophagitis: Secondary | ICD-10-CM | POA: Insufficient documentation

## 2014-09-30 DIAGNOSIS — Z8639 Personal history of other endocrine, nutritional and metabolic disease: Secondary | ICD-10-CM | POA: Insufficient documentation

## 2014-09-30 DIAGNOSIS — F329 Major depressive disorder, single episode, unspecified: Secondary | ICD-10-CM | POA: Insufficient documentation

## 2014-09-30 DIAGNOSIS — F419 Anxiety disorder, unspecified: Secondary | ICD-10-CM | POA: Insufficient documentation

## 2014-09-30 DIAGNOSIS — Z8739 Personal history of other diseases of the musculoskeletal system and connective tissue: Secondary | ICD-10-CM | POA: Insufficient documentation

## 2014-09-30 DIAGNOSIS — Z79899 Other long term (current) drug therapy: Secondary | ICD-10-CM | POA: Insufficient documentation

## 2014-09-30 DIAGNOSIS — J45909 Unspecified asthma, uncomplicated: Secondary | ICD-10-CM | POA: Insufficient documentation

## 2014-09-30 DIAGNOSIS — Z720 Tobacco use: Secondary | ICD-10-CM | POA: Insufficient documentation

## 2014-09-30 DIAGNOSIS — R011 Cardiac murmur, unspecified: Secondary | ICD-10-CM | POA: Insufficient documentation

## 2014-09-30 DIAGNOSIS — I10 Essential (primary) hypertension: Secondary | ICD-10-CM | POA: Insufficient documentation

## 2014-09-30 DIAGNOSIS — Z862 Personal history of diseases of the blood and blood-forming organs and certain disorders involving the immune mechanism: Secondary | ICD-10-CM | POA: Insufficient documentation

## 2014-09-30 LAB — COMPREHENSIVE METABOLIC PANEL
ALT: 46 U/L (ref 17–63)
AST: 66 U/L — ABNORMAL HIGH (ref 15–41)
Albumin: 4.4 g/dL (ref 3.5–5.0)
Alkaline Phosphatase: 118 U/L (ref 38–126)
Anion gap: 14 (ref 5–15)
BILIRUBIN TOTAL: 1 mg/dL (ref 0.3–1.2)
BUN: 8 mg/dL (ref 6–20)
CO2: 22 mmol/L (ref 22–32)
CREATININE: 0.7 mg/dL (ref 0.61–1.24)
Calcium: 9.7 mg/dL (ref 8.9–10.3)
Chloride: 100 mmol/L — ABNORMAL LOW (ref 101–111)
GFR calc Af Amer: >60 mL/min (ref >60)
GFR calc non Af Amer: >60 mL/min (ref >60)
GLUCOSE: 101 mg/dL — AB (ref 65–99)
Potassium: 3.7 mmol/L (ref 3.5–5.1)
Sodium: 136 mmol/L (ref 135–145)
Total Protein: 8.4 g/dL — ABNORMAL HIGH (ref 6.5–8.1)

## 2014-09-30 LAB — CBC WITH DIFFERENTIAL/PLATELET
Basophils Absolute: 0 10*3/uL (ref 0.0–0.1)
Basophils Relative: 0 % (ref 0–1)
Eosinophils Absolute: 0.2 10*3/uL (ref 0.0–0.7)
Eosinophils Relative: 2 % (ref 0–5)
HEMATOCRIT: 40.7 % (ref 39.0–52.0)
Hemoglobin: 13.7 g/dL (ref 13.0–17.0)
Lymphocytes Relative: 35 % (ref 12–46)
Lymphs Abs: 3.8 10*3/uL (ref 0.7–4.0)
MCH: 31.7 pg (ref 26.0–34.0)
MCHC: 33.7 g/dL (ref 30.0–36.0)
MCV: 94.2 fL (ref 78.0–100.0)
Monocytes Absolute: 0.9 10*3/uL (ref 0.1–1.0)
Monocytes Relative: 8 % (ref 3–12)
Neutro Abs: 5.9 10*3/uL (ref 1.7–7.7)
Neutrophils Relative %: 55 % (ref 43–77)
Platelets: 239 10*3/uL (ref 150–400)
RBC: 4.32 MIL/uL (ref 4.22–5.81)
RDW: 16.4 % — ABNORMAL HIGH (ref 11.5–15.5)
WBC: 10.8 10*3/uL — ABNORMAL HIGH (ref 4.0–10.5)

## 2014-09-30 LAB — LIPASE, BLOOD: LIPASE: 76 U/L — AB (ref 22–51)

## 2014-09-30 MED ORDER — ONDANSETRON HCL 4 MG/2ML IJ SOLN
4.0000 mg | Freq: Once | INTRAMUSCULAR | Status: AC
  Administered 2014-09-30: 4 mg via INTRAVENOUS
  Filled 2014-09-30: qty 2

## 2014-09-30 MED ORDER — GI COCKTAIL ~~LOC~~
30.0000 mL | Freq: Once | ORAL | Status: AC
  Administered 2014-09-30: 30 mL via ORAL
  Filled 2014-09-30: qty 30

## 2014-09-30 MED ORDER — HYDROMORPHONE HCL 1 MG/ML IJ SOLN
1.0000 mg | Freq: Once | INTRAMUSCULAR | Status: AC
  Administered 2014-09-30: 1 mg via INTRAVENOUS
  Filled 2014-09-30: qty 1

## 2014-09-30 MED ORDER — SODIUM CHLORIDE 0.9 % IV BOLUS (SEPSIS)
1000.0000 mL | Freq: Once | INTRAVENOUS | Status: AC
  Administered 2014-09-30: 1000 mL via INTRAVENOUS

## 2014-09-30 NOTE — ED Provider Notes (Signed)
CSN: [id number]     Arrival date & time 09/30/14  2023 History   First MD Initiated Contact with Patient 09/30/14 2330     Chief Complaint  Patient presents with  . Abdominal Pain     (Consider location/radiation/quality/duration/timing/severity/associated sxs/prior Treatment) HPI  [name] is a 44 y.o. male with past medical history of hypertension, alcohol and cocaine abuse, pancreatitis, GERD presenting today with abdominal pain. Patient states his pain began at 3 PM and is located in his left upper quadrant with radiation to the back. It is stabbing in sensation. He's had nausea vomiting and diarrhea since 1 PM. He states he drank one 40 ounce beer today. Also had a greasy meal which normally flares his symptoms. He denies any fevers or recent infections. He did not try anything at home to treat his pain. Patient has no further complaints.  10 Systems reviewed and are negative for acute change except as noted in the HPI.     Past Medical History  Diagnosis Date  . Hypertension   . Asthma   . Pancreatitis   . Cocaine abuse   . Depression   . H/O suicide attempt   . Heart murmur     "when he was little" (03/06/2013)  . Shortness of breath     "can happen at anytime" (03/06/2013)  . Anemia   . H/O hiatal hernia   . GERD (gastroesophageal reflux disease)   . Anxiety   . WPW (Wolff-Parkinson-White syndrome)     [name] 03/06/2013  . High cholesterol   . Migraine     "monthly" (01/30/2014)  . Arthritis     "knees; arms; elbows" (01/30/2014)  . Femoral condyle fracture 03/08/2014    left medial/notes 03/09/2014  . Alcoholism /alcohol abuse    Past Surgical History  Procedure Laterality Date  . Facial fracture surgery Left 1990's    "result of trauma"   . Eye surgery Left 1990's    "result of trauma"   . Left heart catheterization with coronary angiogram Right 03/07/2013    Procedure: LEFT HEART CATHETERIZATION WITH CORONARY ANGIOGRAM;  Surgeon: [name], MD;   Location: [facility];  Service: Cardiovascular;  Laterality: Right;   Family History  Problem Relation Age of Onset  . Hypertension Other   . Coronary artery disease Other    History  Substance Use Topics  . Smoking status: Current Every Day Smoker -- 0.50 packs/day for 30 years    Types: Cigarettes  . Smokeless tobacco: Current User    Types: Chew  . Alcohol Use: Yes     Comment: last drank Monday night    Review of Systems    Allergies  Shellfish-derived products and Trazodone and nefazodone  Home Medications   Prior to Admission medications   Medication Sig Start Date End Date Taking? Authorizing Provider  albuterol (PROVENTIL HFA;VENTOLIN HFA) 108 (90 BASE) MCG/ACT inhaler Inhale 2 puffs into the lungs every 6 (six) hours as needed for wheezing or shortness of breath (wheezing).    Yes Historical Provider, MD  folic acid (FOLVITE) 1 MG tablet Take 1 tablet (1 mg total) by mouth daily. 03/21/14  Yes Albertine Patricia, MD  lipase/protease/amylase (CREON) 12000 UNITS CPEP capsule Take 2 capsules (24,000 Units total) by mouth 3 (three) times daily with meals. 02/06/14  Yes [name], MD  loperamide (IMODIUM) 2 MG capsule Take 1-2 capsules (2-4 mg total) by mouth as needed for diarrhea or loose stools. 01/23/14  Yes  [name], PA-C  loratadine (CLARITIN) 10 MG tablet Take 1 tablet (10 mg total) by mouth daily. 02/25/14  Yes Marianne L York, PA-C  metoprolol tartrate (LOPRESSOR) 25 MG tablet Take 1 tablet (25 mg total) by mouth 2 (two) times daily. 02/06/14  Yes [name], MD  Multiple Vitamin (MULTIVITAMIN) capsule Take 1 capsule by mouth daily. 02/06/14  Yes [name], MD  omeprazole (PRILOSEC) 20 MG capsule Take 1 capsule (20 mg total) by mouth daily. 08/26/14  Yes [name], PA-C  ondansetron (ZOFRAN ODT) 4 MG disintegrating tablet Take 1 tablet (4 mg total) by mouth every 8 (eight) hours as needed for nausea or vomiting. 08/26/14  Yes  [name], PA-C  pantoprazole (PROTONIX) 20 MG tablet Take 1 tablet (20 mg total) by mouth daily. 08/19/14  Yes [name], MD  QUEtiapine (SEROQUEL) 100 MG tablet Take 1 tablet (100 mg total) by mouth at bedtime. For mood control 01/15/14  Yes Shanker Kristeen Mans, MD  sertraline (ZOLOFT) 25 MG tablet Take 75 mg by mouth daily.   Yes Historical Provider, MD  sucralfate (CARAFATE) 1 G tablet Take 1 tablet (1 g total) by mouth 4 (four) times daily -  with meals and at bedtime. 08/19/14  Yes [name], MD  dicyclomine (BENTYL) 20 MG tablet Take 1 tablet (20 mg total) by mouth 2 (two) times daily. Patient not taking: Reported on 09/30/2014 08/26/14   [name], PA-C  penicillin v potassium (VEETID) 500 MG tablet Take 1 tablet (500 mg total) by mouth 4 (four) times daily. Patient not taking: Reported on 09/30/2014 08/26/14   [name], PA-C  promethazine (PHENERGAN) 25 MG tablet Take 1 tablet (25 mg total) by mouth every 6 (six) hours as needed for nausea or vomiting. Patient not taking: Reported on 09/30/2014 08/19/14   [name], MD   BP 148/114 mmHg  Pulse 100  Temp(Src) 98.2 F (36.8 C) (Oral)  Resp 20  SpO2 100% Physical Exam  Constitutional: He is oriented to person, place, and time. Vital signs are normal. He appears well-developed and well-nourished.  Non-toxic appearance. He does not appear ill. No distress.  HENT:  Head: Normocephalic and atraumatic.  Nose: Nose normal.  Mouth/Throat: Oropharynx is clear and moist. No oropharyngeal exudate.  Eyes: Conjunctivae and EOM are normal. Pupils are equal, round, and reactive to light. No scleral icterus.  Neck: Normal range of motion. Neck supple. No tracheal deviation, no edema, no erythema and normal range of motion present. No thyroid mass and no thyromegaly present.  Cardiovascular: Normal rate, regular rhythm, S1 normal, S2 normal, normal heart sounds, intact distal pulses and normal pulses.  Exam reveals no gallop and no  friction rub.   No murmur heard. Pulses:      Radial pulses are 2+ on the right side, and 2+ on the left side.       Dorsalis pedis pulses are 2+ on the right side, and 2+ on the left side.  Pulmonary/Chest: Effort normal and breath sounds normal. No respiratory distress. He has no wheezes. He has no rhonchi. He has no rales.  Abdominal: Soft. Normal appearance and bowel sounds are normal. He exhibits no distension, no ascites and no mass. There is no hepatosplenomegaly. There is tenderness. There is no rebound, no guarding and no CVA tenderness.  Left upper quadrant tenderness to palpation. No CVA tenderness.  Musculoskeletal: Normal range of motion. He exhibits no edema or tenderness.  Lymphadenopathy:    He has  no cervical adenopathy.  Neurological: He is alert and oriented to person, place, and time. He has normal strength. No cranial nerve deficit or sensory deficit.  Skin: Skin is warm, dry and intact. No petechiae and no rash noted. He is not diaphoretic. No erythema. No pallor.  Nursing note and vitals reviewed.   ED Course  Procedures (including critical care time) Labs Review Labs Reviewed  CBC WITH DIFFERENTIAL/PLATELET - Abnormal; Notable for the following:    WBC 10.8 (*)    RDW 16.4 (*)    All other components within normal limits  COMPREHENSIVE METABOLIC PANEL - Abnormal; Notable for the following:    Chloride 100 (*)    Glucose, Bld 101 (*)    Total Protein 8.4 (*)    AST 66 (*)    All other components within normal limits  LIPASE, BLOOD - Abnormal; Notable for the following:    Lipase 76 (*)    All other components within normal limits    Imaging Review No results found.   EKG Interpretation None      MDM   Final diagnoses:  None    Patient presents emergency department for acute on chronic pancreatitis. He has multiple emergency department visits for the same. He states he normally gets a GI cocktail and Dilaudid for his symptoms. His lipase is 76,  slightly elevated compared to his baseline. He was given GI cocktail and Dilaudid. He was given 1 L IV fluid bolus as well as Zofran. Will continue to re-evaluate.  Patient received a second dose of Dilaudid. I achieved good pain control. He is able to tolerate ice chips and fluids in the emergency department. He was advised to refrain from alcohol and greasy meals in the future.  Patient's Protonix was refilled. His vital signs remain within his normal limits and he is safe for discharge. Advise for primary care follow-up within 3 days. Patient is in no acute distress.    Everlene Balls, MD 10/01/14 [phone]

## 2014-09-30 NOTE — ED Notes (Signed)
Pt states he has pancreatitis and started having abd pain today around 2pm  Pt states he has also had N/V/D

## 2014-10-01 MED ORDER — HYDROMORPHONE HCL 1 MG/ML IJ SOLN
1.0000 mg | Freq: Once | INTRAMUSCULAR | Status: AC
  Administered 2014-10-01: 1 mg via INTRAVENOUS
  Filled 2014-10-01: qty 1

## 2014-10-01 MED ORDER — PANTOPRAZOLE SODIUM 20 MG PO TBEC: 20.0000 mg | DELAYED_RELEASE_TABLET | Freq: Every day | ORAL | Status: DC

## 2014-10-01 NOTE — ED Notes (Signed)
Provided ice chips.  

## 2014-10-01 NOTE — Discharge Instructions (Signed)
Acute Pancreatitis [name], stop drinking alcohol and do not eat greasy meals to prevent your pancreas from causing your pain. See your primary care physician within 3 days for close follow-up. If any symptoms worsen come back to emergency department immediately. Thank you. Acute pancreatitis is a disease in which the pancreas becomes suddenly irritated (inflamed). The pancreas is a large gland behind your stomach. The pancreas makes enzymes that help digest food. The pancreas also makes 2 hormones that help control your blood sugar. Acute pancreatitis happens when the enzymes attack and damage the pancreas. Most attacks last a couple of days and can cause serious problems. HOME CARE  Follow your doctor's diet instructions. You may need to avoid alcohol and limit fat in your diet.  Eat small meals often.  Drink enough fluids to keep your pee (urine) clear or pale yellow.  Only take medicines as told by your doctor.  Avoid drinking alcohol if it caused your disease.  Do not smoke.  Get plenty of rest.  Check your blood sugar at home as told by your doctor.  Keep all doctor visits as told. GET HELP IF:  You do not get better as quickly as expected.  You have new or worsening symptoms.  You have lasting pain, weakness, or feel sick to your stomach (nauseous).  You get better and then have another pain attack. GET HELP RIGHT AWAY IF:   You are unable to eat or keep fluids down.  Your pain becomes severe.  You have a fever or lasting symptoms for more than 2 to 3 days.  You have a fever and your symptoms suddenly get worse.  Your skin or the white part of your eyes turn yellow (jaundice).  You throw up (vomit).  You feel dizzy, or you pass out (faint).  Your blood sugar is high (over 300 mg/dL). MAKE SURE YOU:   Understand these instructions.  Will watch your condition.  Will get help right away if you are not doing well or get worse. Document Released: 10/12/2007  Document Revised: 09/09/2013 Document Reviewed: 08/04/2011 [facility] Patient Information 2015 [place], [place]. This information is not intended to replace advice given to you by your health care provider. Make sure you discuss any questions you have with your health care provider.

## 2014-11-03 ENCOUNTER — Telehealth: Payer: Self-pay | Admitting: Clinical

## 2014-11-03 NOTE — Telephone Encounter (Signed)
F/u w pt; pt states he will make an appointment to see PCP and [facility]. He needs referral to pain clinic, has paperwork for orange card, his disability is pending (denied in December, disputing that now)

## 2014-11-16 ENCOUNTER — Encounter (HOSPITAL_COMMUNITY): Payer: Self-pay

## 2014-11-16 ENCOUNTER — Emergency Department (HOSPITAL_COMMUNITY)
Admission: EM | Admit: 2014-11-16 | Discharge: 2014-11-16 | Disposition: A | Discharge status: Home / Self Care | Payer: Self-pay | Attending: Emergency Medicine | Admitting: Emergency Medicine

## 2014-11-16 DIAGNOSIS — Z720 Tobacco use: Secondary | ICD-10-CM | POA: Insufficient documentation

## 2014-11-16 DIAGNOSIS — F419 Anxiety disorder, unspecified: Secondary | ICD-10-CM | POA: Insufficient documentation

## 2014-11-16 DIAGNOSIS — R197 Diarrhea, unspecified: Secondary | ICD-10-CM | POA: Insufficient documentation

## 2014-11-16 DIAGNOSIS — Z8781 Personal history of (healed) traumatic fracture: Secondary | ICD-10-CM | POA: Insufficient documentation

## 2014-11-16 DIAGNOSIS — J45909 Unspecified asthma, uncomplicated: Secondary | ICD-10-CM | POA: Insufficient documentation

## 2014-11-16 DIAGNOSIS — I10 Essential (primary) hypertension: Secondary | ICD-10-CM | POA: Insufficient documentation

## 2014-11-16 DIAGNOSIS — G8929 Other chronic pain: Secondary | ICD-10-CM | POA: Insufficient documentation

## 2014-11-16 DIAGNOSIS — Z79899 Other long term (current) drug therapy: Secondary | ICD-10-CM | POA: Insufficient documentation

## 2014-11-16 DIAGNOSIS — R1013 Epigastric pain: Secondary | ICD-10-CM | POA: Insufficient documentation

## 2014-11-16 DIAGNOSIS — D649 Anemia, unspecified: Secondary | ICD-10-CM | POA: Insufficient documentation

## 2014-11-16 DIAGNOSIS — R109 Unspecified abdominal pain: Secondary | ICD-10-CM

## 2014-11-16 DIAGNOSIS — K219 Gastro-esophageal reflux disease without esophagitis: Secondary | ICD-10-CM | POA: Insufficient documentation

## 2014-11-16 DIAGNOSIS — R000 Tachycardia, unspecified: Secondary | ICD-10-CM | POA: Insufficient documentation

## 2014-11-16 DIAGNOSIS — R1012 Left upper quadrant pain: Secondary | ICD-10-CM | POA: Insufficient documentation

## 2014-11-16 DIAGNOSIS — E780 Pure hypercholesterolemia: Secondary | ICD-10-CM | POA: Insufficient documentation

## 2014-11-16 DIAGNOSIS — Z9889 Other specified postprocedural states: Secondary | ICD-10-CM | POA: Insufficient documentation

## 2014-11-16 DIAGNOSIS — F329 Major depressive disorder, single episode, unspecified: Secondary | ICD-10-CM | POA: Insufficient documentation

## 2014-11-16 DIAGNOSIS — R011 Cardiac murmur, unspecified: Secondary | ICD-10-CM | POA: Insufficient documentation

## 2014-11-16 DIAGNOSIS — R112 Nausea with vomiting, unspecified: Secondary | ICD-10-CM | POA: Insufficient documentation

## 2014-11-16 LAB — CBC WITH DIFFERENTIAL/PLATELET
Basophils Absolute: 0 10*3/uL (ref 0.0–0.1)
Basophils Relative: 0 % (ref 0–1)
EOS PCT: 3 % (ref 0–5)
Eosinophils Absolute: 0.3 10*3/uL (ref 0.0–0.7)
HEMATOCRIT: 40.1 % (ref 39.0–52.0)
HEMOGLOBIN: 14 g/dL (ref 13.0–17.0)
LYMPHS ABS: 4.1 10*3/uL — AB (ref 0.7–4.0)
Lymphocytes Relative: 34 % (ref 12–46)
MCH: 32.9 pg (ref 26.0–34.0)
MCHC: 34.9 g/dL (ref 30.0–36.0)
MCV: 94.1 fL (ref 78.0–100.0)
MONOS PCT: 10 % (ref 3–12)
Monocytes Absolute: 1.2 10*3/uL — ABNORMAL HIGH (ref 0.1–1.0)
Neutro Abs: 6.5 10*3/uL (ref 1.7–7.7)
Neutrophils Relative %: 54 % (ref 43–77)
Platelets: 201 10*3/uL (ref 150–400)
RBC: 4.26 MIL/uL (ref 4.22–5.81)
RDW: 12.8 % (ref 11.5–15.5)
WBC: 12.1 10*3/uL — AB (ref 4.0–10.5)

## 2014-11-16 LAB — COMPREHENSIVE METABOLIC PANEL
ALT: 46 U/L (ref 17–63)
AST: 79 U/L — AB (ref 15–41)
Albumin: 3.7 g/dL (ref 3.5–5.0)
Alkaline Phosphatase: 113 U/L (ref 38–126)
Anion gap: 8 (ref 5–15)
BILIRUBIN TOTAL: 0.8 mg/dL (ref 0.3–1.2)
CHLORIDE: 105 mmol/L (ref 101–111)
CO2: 24 mmol/L (ref 22–32)
CREATININE: 0.57 mg/dL — AB (ref 0.61–1.24)
Calcium: 9 mg/dL (ref 8.9–10.3)
GFR calc Af Amer: >60 mL/min (ref >60)
GLUCOSE: 100 mg/dL — AB (ref 65–99)
Potassium: 3.9 mmol/L (ref 3.5–5.1)
Sodium: 137 mmol/L (ref 135–145)
Total Protein: 7.8 g/dL (ref 6.5–8.1)

## 2014-11-16 LAB — LIPASE, BLOOD: Lipase: 23 U/L (ref 22–51)

## 2014-11-16 LAB — POC OCCULT BLOOD, ED: Fecal Occult Bld: NEGATIVE

## 2014-11-16 MED ORDER — HYDROMORPHONE HCL 1 MG/ML IJ SOLN
1.0000 mg | Freq: Once | INTRAMUSCULAR | Status: AC
  Administered 2014-11-16: 1 mg via INTRAVENOUS
  Filled 2014-11-16: qty 1

## 2014-11-16 MED ORDER — ONDANSETRON HCL 4 MG/2ML IJ SOLN
4.0000 mg | Freq: Once | INTRAMUSCULAR | Status: AC
  Administered 2014-11-16: 4 mg via INTRAVENOUS
  Filled 2014-11-16: qty 2

## 2014-11-16 MED ORDER — SODIUM CHLORIDE 0.9 % IV SOLN
Freq: Once | INTRAVENOUS | Status: AC
Start: 2014-11-16 — End: 2014-11-16
  Administered 2014-11-16: 06:00:00 via INTRAVENOUS

## 2014-11-16 NOTE — ED Provider Notes (Signed)
CSN: [id number]     Arrival date & time 11/16/14  0509 History   First MD Initiated Contact with Patient 11/16/14 [phone]     Chief Complaint  Patient presents with  . Abdominal Pain     (Consider location/radiation/quality/duration/timing/severity/associated sxs/prior Treatment) HPI Comments: This is patient with a history of chronic pancreatitis who presents to the emergency room with 8 hours, epigastric left upper quadrant abdominal pain.  One episode vomiting, persistent nausea.  Also, states he's had 10 or 11 episodes of loose stools.  He states he generally last night about 6 which was normal, and started having symptoms about 8:00.  He states he took some Imodium for his diarrhea without any relief,  states he's not had any alcohol in 5 days  Patient is a 44 y.o. male presenting with abdominal pain. The history is provided by the patient.  Abdominal Pain Pain location:  LUQ and epigastric Pain quality: squeezing and stabbing   Pain radiates to:  Does not radiate Pain severity:  Moderate Onset quality:  Gradual Duration:  8 hours Timing:  Constant Progression:  Worsening Chronicity:  Recurrent Context: retching   Relieved by:  Nothing Worsened by:  Nothing tried Ineffective treatments:  None tried Associated symptoms: diarrhea, nausea and vomiting   Associated symptoms: no chest pain, no constipation, no dysuria, no fever and no shortness of breath   Diarrhea:    Quality:  Semi-solid   Number of occurrences:  10   Severity:  Moderate   Duration:  8 hours   Timing:  Intermittent   Progression:  Unchanged Nausea:    Severity:  Mild   Onset quality:  Gradual   Duration:  8 hours   Progression:  Unchanged Risk factors: alcohol abuse   Risk factors comment:  1.  Pancreatitis   Past Medical History  Diagnosis Date  . Hypertension   . Asthma   . Pancreatitis   . Cocaine abuse   . Depression   . H/O suicide attempt   . Heart murmur     "when he was little"  (03/06/2013)  . Shortness of breath     "can happen at anytime" (03/06/2013)  . Anemia   . H/O hiatal hernia   . GERD (gastroesophageal reflux disease)   . Anxiety   . WPW (Wolff-Parkinson-White syndrome)     [name] 03/06/2013  . High cholesterol   . Migraine     "monthly" (01/30/2014)  . Arthritis     "knees; arms; elbows" (01/30/2014)  . Femoral condyle fracture 03/08/2014    left medial/notes 03/09/2014  . Alcoholism /alcohol abuse    Past Surgical History  Procedure Laterality Date  . Facial fracture surgery Left 1990's    "result of trauma"   . Eye surgery Left 1990's    "result of trauma"   . Left heart catheterization with coronary angiogram Right 03/07/2013    Procedure: LEFT HEART CATHETERIZATION WITH CORONARY ANGIOGRAM;  Surgeon: [name], MD;  Location: [facility];  Service: Cardiovascular;  Laterality: Right;   Family History  Problem Relation Age of Onset  . Hypertension Other   . Coronary artery disease Other    History  Substance Use Topics  . Smoking status: Current Every Day Smoker -- 0.50 packs/day for 30 years    Types: Cigarettes  . Smokeless tobacco: Current User    Types: Chew  . Alcohol Use: Yes     Comment: last drink 1 week ago    Review of Systems  Constitutional: Negative for fever.  Respiratory: Negative for shortness of breath.   Cardiovascular: Negative for chest pain.  Gastrointestinal: Positive for nausea, vomiting, abdominal pain and diarrhea. Negative for constipation and blood in stool.  Genitourinary: Negative for dysuria.  Skin: Negative for rash and wound.  Neurological: Negative for dizziness.  All other systems reviewed and are negative.     Allergies  Shellfish-derived products and Trazodone and nefazodone  Home Medications   Prior to Admission medications   Medication Sig Start Date End Date Taking? Authorizing Provider  albuterol (PROVENTIL HFA;VENTOLIN HFA) 108 (90 BASE) MCG/ACT inhaler Inhale 2 puffs into  the lungs every 6 (six) hours as needed for wheezing or shortness of breath (wheezing).    Yes Historical Provider, MD  folic acid (FOLVITE) 1 MG tablet Take 1 tablet (1 mg total) by mouth daily. 03/21/14  Yes Albertine Patricia, MD  lipase/protease/amylase (CREON) 12000 UNITS CPEP capsule Take 2 capsules (24,000 Units total) by mouth 3 (three) times daily with meals. 02/06/14  Yes [name], MD  loperamide (IMODIUM) 2 MG capsule Take 1-2 capsules (2-4 mg total) by mouth as needed for diarrhea or loose stools. 01/23/14  Yes Marianne L York, PA-C  loratadine (CLARITIN) 10 MG tablet Take 1 tablet (10 mg total) by mouth daily. Patient taking differently: Take 10 mg by mouth daily as needed for allergies.  02/25/14  Yes Marianne L York, PA-C  metoprolol tartrate (LOPRESSOR) 25 MG tablet Take 1 tablet (25 mg total) by mouth 2 (two) times daily. 02/06/14  Yes [name], MD  Multiple Vitamin (MULTIVITAMIN) capsule Take 1 capsule by mouth daily. 02/06/14  Yes [name], MD  omeprazole (PRILOSEC) 20 MG capsule Take 1 capsule (20 mg total) by mouth daily. 08/26/14  Yes [name], PA-C  ondansetron (ZOFRAN ODT) 4 MG disintegrating tablet Take 1 tablet (4 mg total) by mouth every 8 (eight) hours as needed for nausea or vomiting. 08/26/14  Yes [name], PA-C  QUEtiapine (SEROQUEL) 100 MG tablet Take 1 tablet (100 mg total) by mouth at bedtime. For mood control 01/15/14  Yes Shanker Kristeen Mans, MD  sertraline (ZOLOFT) 25 MG tablet Take 75 mg by mouth daily.   Yes Historical Provider, MD  sucralfate (CARAFATE) 1 G tablet Take 1 tablet (1 g total) by mouth 4 (four) times daily -  with meals and at bedtime. 08/19/14  Yes [name], MD   BP 144/91 mmHg  Pulse 91  Temp(Src) 99 F (37.2 C) (Oral)  Resp 20  SpO2 98% Physical Exam  Constitutional: He is oriented to person, place, and time. He appears well-developed and well-nourished.  HENT:  Head: Normocephalic.  Eyes: Pupils are  equal, round, and reactive to light.  Neck: Normal range of motion.  Cardiovascular: Regular rhythm.  Tachycardia present.   Pulmonary/Chest: Effort normal and breath sounds normal.  Abdominal: Soft. He exhibits no distension. There is tenderness in the epigastric area and left upper quadrant. There is no rebound and no guarding.  Musculoskeletal: Normal range of motion.  Neurological: He is alert and oriented to person, place, and time.  Skin: Skin is warm.  Nursing note and vitals reviewed.   ED Course  Procedures (including critical care time) Labs Review Labs Reviewed  CBC WITH DIFFERENTIAL/PLATELET - Abnormal; Notable for the following:    WBC 12.1 (*)    Lymphs Abs 4.1 (*)    Monocytes Absolute 1.2 (*)    All other components within normal limits  COMPREHENSIVE METABOLIC PANEL - Abnormal; Notable for the following:    Glucose, Bld 100 (*)    BUN <5 (*)    Creatinine, Ser 0.57 (*)    AST 79 (*)    All other components within normal limits  LIPASE, BLOOD  POC OCCULT BLOOD, ED    Imaging Review No results found.   EKG Interpretation None      MDM   Final diagnoses:  Chronic abdominal pain         Junius Creamer, NP 11/25/14 1957  Julianne Rice, MD 11/29/14 [phone]

## 2014-11-16 NOTE — ED Provider Notes (Signed)
Care assumed from Junius Creamer, NP at shift change. Pt with chronic pancreatitis presenting with abdominal pain and diarrhea. Similar to prior pancreatitis flares. Labs with leukocytosis of 12.1, no other acute findings. Lipase 23. Has chronic elevation of AST. Plan to control pain, d/c home. Frequently requesting dilaudid to staff.  7:20 AM Pt resting comfortably on exam bed. Abdomen is soft with no peritoneal signs. LUQ tenderness without guarding. Pain is chronic, and I do not feel imaging is necessary at this time. No diarrhea in the ED. No vomiting. Tolerating PO. Stable for d/c. F/u with PCP. Return precautions given. Patient states understanding of treatment care plan and is agreeable.  Results for orders placed or performed during the hospital encounter of 11/16/14  CBC with Differential  Result Value Ref Range   WBC 12.1 (H) 4.0 - 10.5 K/uL   RBC 4.26 4.22 - 5.81 MIL/uL   Hemoglobin 14.0 13.0 - 17.0 g/dL   HCT 40.1 39.0 - 52.0 %   MCV 94.1 78.0 - 100.0 fL   MCH 32.9 26.0 - 34.0 pg   MCHC 34.9 30.0 - 36.0 g/dL   RDW 12.8 11.5 - 15.5 %   Platelets 201 150 - 400 K/uL   Neutrophils Relative % 54 43 - 77 %   Neutro Abs 6.5 1.7 - 7.7 K/uL   Lymphocytes Relative 34 12 - 46 %   Lymphs Abs 4.1 (H) 0.7 - 4.0 K/uL   Monocytes Relative 10 3 - 12 %   Monocytes Absolute 1.2 (H) 0.1 - 1.0 K/uL   Eosinophils Relative 3 0 - 5 %   Eosinophils Absolute 0.3 0.0 - 0.7 K/uL   Basophils Relative 0 0 - 1 %   Basophils Absolute 0.0 0.0 - 0.1 K/uL   Smear Review MORPHOLOGY UNREMARKABLE   Comprehensive metabolic panel  Result Value Ref Range   Sodium 137 135 - 145 mmol/L   Potassium 3.9 3.5 - 5.1 mmol/L   Chloride 105 101 - 111 mmol/L   CO2 24 22 - 32 mmol/L   Glucose, Bld 100 (H) 65 - 99 mg/dL   BUN <5 (L) 6 - 20 mg/dL   Creatinine, Ser 0.57 (L) 0.61 - 1.24 mg/dL   Calcium 9.0 8.9 - 10.3 mg/dL   Total Protein 7.8 6.5 - 8.1 g/dL   Albumin 3.7 3.5 - 5.0 g/dL   AST 79 (H) 15 - 41 U/L   ALT 46 17  - 63 U/L   Alkaline Phosphatase 113 38 - 126 U/L   Total Bilirubin 0.8 0.3 - 1.2 mg/dL   GFR calc non Af Amer >60 >60 mL/min   GFR calc Af Amer >60 >60 mL/min   Anion gap 8 5 - 15  Lipase, blood  Result Value Ref Range   Lipase 23 22 - 51 U/L  POC occult blood, ED RN will collect  Result Value Ref Range   Fecal Occult Bld NEGATIVE NEGATIVE     Carman Ching, PA-C 11/16/14 3009  Julianne Rice, MD 11/24/14 [phone]

## 2014-11-16 NOTE — Discharge Instructions (Signed)
Chronic Pain Chronic pain can be defined as pain that is off and on and lasts for 3-6 months or longer. Many things cause chronic pain, which can make it difficult to make a diagnosis. There are many treatment options available for chronic pain. However, finding a treatment that works well for you may require trying various approaches until the right one is found. Many people benefit from a combination of two or more types of treatment to control their pain. SYMPTOMS  Chronic pain can occur anywhere in the body and can range from mild to very severe. Some types of chronic pain include:  Headache.  Low back pain.  Cancer pain.  Arthritis pain.  Neurogenic pain. This is pain resulting from damage to nerves. People with chronic pain may also have other symptoms such as:  Depression.  Anger.  Insomnia.  Anxiety. DIAGNOSIS  Your health care provider will help diagnose your condition over time. In many cases, the initial focus will be on excluding possible conditions that could be causing the pain. Depending on your symptoms, your health care provider may order tests to diagnose your condition. Some of these tests may include:   Blood tests.   CT scan.   MRI.   X-rays.   Ultrasounds.   Nerve conduction studies.  You may need to see a specialist.  TREATMENT  Finding treatment that works well may take time. You may be referred to a pain specialist. He or she may prescribe medicine or therapies, such as:   Mindful meditation or yoga.  Shots (injections) of numbing or pain-relieving medicines into the spine or area of pain.  Local electrical stimulation.  Acupuncture.   Massage therapy.   Aroma, color, light, or sound therapy.   Biofeedback.   Working with a physical therapist to keep from getting stiff.   Regular, gentle exercise.   Cognitive or behavioral therapy.   Group support.  Sometimes, surgery may be recommended.  HOME CARE INSTRUCTIONS    Take all medicines as directed by your health care provider.   Lessen stress in your life by relaxing and doing things such as listening to calming music.   Exercise or be active as directed by your health care provider.   Eat a healthy diet and include things such as vegetables, fruits, fish, and lean meats in your diet.   Keep all follow-up appointments with your health care provider.   Attend a support group with others suffering from chronic pain. SEEK MEDICAL CARE IF:   Your pain gets worse.   You develop a new pain that was not there before.   You cannot tolerate medicines given to you by your health care provider.   You have new symptoms since your last visit with your health care provider.  SEEK IMMEDIATE MEDICAL CARE IF:   You feel weak.   You have decreased sensation or numbness.   You lose control of bowel or bladder function.   Your pain suddenly gets much worse.   You develop shaking.  You develop chills.  You develop confusion.  You develop chest pain.  You develop shortness of breath.  MAKE SURE YOU:  Understand these instructions.  Will watch your condition.  Will get help right away if you are not doing well or get worse. Document Released: 01/15/2002 Document Revised: 12/26/2012 Document Reviewed: 10/19/2012 [facility] Patient Information 2015 [place], [place]. This information is not intended to replace advice given to you by your health care provider. Make sure you discuss any  questions you have with your health care provider.  Abdominal Pain Many things can cause abdominal pain. Usually, abdominal pain is not caused by a disease and will improve without treatment. It can often be observed and treated at home. Your health care provider will do a physical exam and possibly order blood tests and X-rays to help determine the seriousness of your pain. However, in many cases, more time must pass before a clear cause of the pain can be  found. Before that point, your health care provider may not know if you need more testing or further treatment. HOME CARE INSTRUCTIONS  Monitor your abdominal pain for any changes. The following actions may help to alleviate any discomfort you are experiencing:  Only take over-the-counter or prescription medicines as directed by your health care provider.  Do not take laxatives unless directed to do so by your health care provider.  Try a clear liquid diet (broth, tea, or water) as directed by your health care provider. Slowly move to a bland diet as tolerated. SEEK MEDICAL CARE IF:  You have unexplained abdominal pain.  You have abdominal pain associated with nausea or diarrhea.  You have pain when you urinate or have a bowel movement.  You experience abdominal pain that wakes you in the night.  You have abdominal pain that is worsened or improved by eating food.  You have abdominal pain that is worsened with eating fatty foods.  You have a fever. SEEK IMMEDIATE MEDICAL CARE IF:   Your pain does not go away within 2 hours.  You keep throwing up (vomiting).  Your pain is felt only in portions of the abdomen, such as the right side or the left lower portion of the abdomen.  You pass bloody or black tarry stools. MAKE SURE YOU:  Understand these instructions.   Will watch your condition.   Will get help right away if you are not doing well or get worse.  Document Released: 02/02/2005 Document Revised: 04/30/2013 Document Reviewed: 01/02/2013 [facility] Patient Information 2015 [place], [place]. This information is not intended to replace advice given to you by your health care provider. Make sure you discuss any questions you have with your health care provider.

## 2014-11-16 NOTE — ED Notes (Addendum)
Per EMS - pt c/o 9/10 abdominal pain, believes pancreatitis. Vomited last 2hrs ago, reports 11 episodes diarrhea. BP 150/100 (hx htn), hr 100bpm, rr20. Denies any alcohol use, believes food related. Takes creon for digestion.

## 2014-11-26 ENCOUNTER — Emergency Department (HOSPITAL_COMMUNITY)
Admission: EM | Admit: 2014-11-26 | Discharge: 2014-11-27 | Disposition: A | Discharge status: Home / Self Care | Payer: Self-pay | Attending: Emergency Medicine | Admitting: Emergency Medicine

## 2014-11-26 ENCOUNTER — Emergency Department (HOSPITAL_COMMUNITY): Payer: Self-pay

## 2014-11-26 ENCOUNTER — Encounter (HOSPITAL_COMMUNITY): Payer: Self-pay | Admitting: Emergency Medicine

## 2014-11-26 DIAGNOSIS — F419 Anxiety disorder, unspecified: Secondary | ICD-10-CM | POA: Insufficient documentation

## 2014-11-26 DIAGNOSIS — J45909 Unspecified asthma, uncomplicated: Secondary | ICD-10-CM | POA: Insufficient documentation

## 2014-11-26 DIAGNOSIS — Z79899 Other long term (current) drug therapy: Secondary | ICD-10-CM | POA: Insufficient documentation

## 2014-11-26 DIAGNOSIS — K219 Gastro-esophageal reflux disease without esophagitis: Secondary | ICD-10-CM | POA: Insufficient documentation

## 2014-11-26 DIAGNOSIS — I10 Essential (primary) hypertension: Secondary | ICD-10-CM | POA: Insufficient documentation

## 2014-11-26 DIAGNOSIS — M1990 Unspecified osteoarthritis, unspecified site: Secondary | ICD-10-CM | POA: Insufficient documentation

## 2014-11-26 DIAGNOSIS — G43909 Migraine, unspecified, not intractable, without status migrainosus: Secondary | ICD-10-CM | POA: Insufficient documentation

## 2014-11-26 DIAGNOSIS — F329 Major depressive disorder, single episode, unspecified: Secondary | ICD-10-CM | POA: Insufficient documentation

## 2014-11-26 DIAGNOSIS — Z8639 Personal history of other endocrine, nutritional and metabolic disease: Secondary | ICD-10-CM | POA: Insufficient documentation

## 2014-11-26 DIAGNOSIS — R0789 Other chest pain: Secondary | ICD-10-CM | POA: Insufficient documentation

## 2014-11-26 DIAGNOSIS — K860 Alcohol-induced chronic pancreatitis: Secondary | ICD-10-CM | POA: Insufficient documentation

## 2014-11-26 DIAGNOSIS — D649 Anemia, unspecified: Secondary | ICD-10-CM | POA: Insufficient documentation

## 2014-11-26 DIAGNOSIS — R011 Cardiac murmur, unspecified: Secondary | ICD-10-CM | POA: Insufficient documentation

## 2014-11-26 DIAGNOSIS — Z720 Tobacco use: Secondary | ICD-10-CM | POA: Insufficient documentation

## 2014-11-26 DIAGNOSIS — Z8781 Personal history of (healed) traumatic fracture: Secondary | ICD-10-CM | POA: Insufficient documentation

## 2014-11-26 LAB — CBC
HEMATOCRIT: 34.3 % — AB (ref 39.0–52.0)
HEMOGLOBIN: 11.5 g/dL — AB (ref 13.0–17.0)
MCH: 31.7 pg (ref 26.0–34.0)
MCHC: 33.5 g/dL (ref 30.0–36.0)
MCV: 94.5 fL (ref 78.0–100.0)
Platelets: 202 10*3/uL (ref 150–400)
RBC: 3.63 MIL/uL — AB (ref 4.22–5.81)
RDW: 13.4 % (ref 11.5–15.5)
WBC: 10.4 10*3/uL (ref 4.0–10.5)

## 2014-11-26 LAB — BASIC METABOLIC PANEL
Anion gap: 8 (ref 5–15)
BUN: 6 mg/dL (ref 6–20)
CHLORIDE: 107 mmol/L (ref 101–111)
CO2: 22 mmol/L (ref 22–32)
Calcium: 8.6 mg/dL — ABNORMAL LOW (ref 8.9–10.3)
Creatinine, Ser: 0.72 mg/dL (ref 0.61–1.24)
GFR calc Af Amer: >60 mL/min (ref >60)
GFR calc non Af Amer: >60 mL/min (ref >60)
GLUCOSE: 102 mg/dL — AB (ref 65–99)
POTASSIUM: 3.4 mmol/L — AB (ref 3.5–5.1)
SODIUM: 137 mmol/L (ref 135–145)

## 2014-11-26 LAB — I-STAT TROPONIN, ED: TROPONIN I, POC: 0 ng/mL (ref 0.00–0.08)

## 2014-11-26 MED ORDER — PANTOPRAZOLE SODIUM 20 MG PO TBEC: 20.0000 mg | DELAYED_RELEASE_TABLET | Freq: Every day | ORAL | Status: DC

## 2014-11-26 MED ORDER — IOHEXOL 350 MG/ML SOLN
100.0000 mL | Freq: Once | INTRAVENOUS | Status: AC | PRN
  Administered 2014-11-26: 100 mL via INTRAVENOUS

## 2014-11-26 MED ORDER — MORPHINE SULFATE 4 MG/ML IJ SOLN
4.0000 mg | Freq: Once | INTRAMUSCULAR | Status: AC
  Administered 2014-11-26: 4 mg via INTRAVENOUS
  Filled 2014-11-26: qty 1

## 2014-11-26 NOTE — ED Provider Notes (Signed)
CSN: [id number]     Arrival date & time 11/26/14  2044 History   First MD Initiated Contact with Patient 11/26/14 2053     Chief Complaint  Patient presents with  . Chest Pain     (Consider location/radiation/quality/duration/timing/severity/associated sxs/prior Treatment) HPI Patient poor she had a sudden onset of sharp chest pain in his left central chest while walking. He had no associated symptoms. He reports it is sharp and radiates around his side towards his back. No associated nausea or vomiting. No associated shortness of breath. Patient denies recent fever chills or cough. Patient reports his true pancreatitis but reports pain does not feel similar. EMS administered 2 nitroglycerin but there is no change in the pain quality.  Past Medical History  Diagnosis Date  . Hypertension   . Asthma   . Pancreatitis   . Cocaine abuse   . Depression   . H/O suicide attempt   . Heart murmur     "when he was little" (03/06/2013)  . Shortness of breath     "can happen at anytime" (03/06/2013)  . Anemia   . H/O hiatal hernia   . GERD (gastroesophageal reflux disease)   . Anxiety   . WPW (Wolff-Parkinson-White syndrome)     [name] 03/06/2013  . High cholesterol   . Migraine     "monthly" (01/30/2014)  . Arthritis     "knees; arms; elbows" (01/30/2014)  . Femoral condyle fracture 03/08/2014    left medial/notes 03/09/2014  . Alcoholism /alcohol abuse    Past Surgical History  Procedure Laterality Date  . Facial fracture surgery Left 1990's    "result of trauma"   . Eye surgery Left 1990's    "result of trauma"   . Left heart catheterization with coronary angiogram Right 03/07/2013    Procedure: LEFT HEART CATHETERIZATION WITH CORONARY ANGIOGRAM;  Surgeon: [name], MD;  Location: [facility];  Service: Cardiovascular;  Laterality: Right;   Family History  Problem Relation Age of Onset  . Hypertension Other   . Coronary artery disease Other    History  Substance Use  Topics  . Smoking status: Current Every Day Smoker -- 0.50 packs/day for 30 years    Types: Cigarettes  . Smokeless tobacco: Current User    Types: Chew  . Alcohol Use: Yes     Comment: last drink monday    Review of Systems  10 Systems reviewed and are negative for acute change except as noted in the HPI.  Allergies  Shellfish-derived products and Trazodone and nefazodone  Home Medications   Prior to Admission medications   Medication Sig Start Date End Date Taking? Authorizing Provider  albuterol (PROVENTIL HFA;VENTOLIN HFA) 108 (90 BASE) MCG/ACT inhaler Inhale 2 puffs into the lungs every 6 (six) hours as needed for wheezing or shortness of breath (wheezing).    Yes Historical Provider, MD  diphenhydrAMINE (BENADRYL) 25 MG tablet Take 25 mg by mouth every 6 (six) hours as needed for allergies.   Yes Historical Provider, MD  folic acid (FOLVITE) 1 MG tablet Take 1 tablet (1 mg total) by mouth daily. 03/21/14  Yes Albertine Patricia, MD  lipase/protease/amylase (CREON) 12000 UNITS CPEP capsule Take 2 capsules (24,000 Units total) by mouth 3 (three) times daily with meals. 02/06/14  Yes [name], MD  metoprolol tartrate (LOPRESSOR) 25 MG tablet Take 1 tablet (25 mg total) by mouth 2 (two) times daily. 02/06/14  Yes [name], MD  Multiple Vitamin (MULTIVITAMIN)  capsule Take 1 capsule by mouth daily. 02/06/14  Yes [name], MD  omeprazole (PRILOSEC) 20 MG capsule Take 1 capsule (20 mg total) by mouth daily. 08/26/14  Yes [name], PA-C  QUEtiapine (SEROQUEL) 100 MG tablet Take 1 tablet (100 mg total) by mouth at bedtime. For mood control 01/15/14  Yes Shanker Kristeen Mans, MD  sertraline (ZOLOFT) 25 MG tablet Take 75 mg by mouth daily.   Yes Historical Provider, MD  sucralfate (CARAFATE) 1 G tablet Take 1 tablet (1 g total) by mouth 4 (four) times daily -  with meals and at bedtime. 08/19/14  Yes [name], MD  loperamide (IMODIUM) 2 MG capsule Take 1-2  capsules (2-4 mg total) by mouth as needed for diarrhea or loose stools. 01/23/14   [name], PA-C  loratadine (CLARITIN) 10 MG tablet Take 1 tablet (10 mg total) by mouth daily. Patient taking differently: Take 10 mg by mouth daily as needed for allergies.  02/25/14   [name], PA-C  ondansetron (ZOFRAN ODT) 4 MG disintegrating tablet Take 1 tablet (4 mg total) by mouth every 8 (eight) hours as needed for nausea or vomiting. 08/26/14   [name], PA-C  pantoprazole (PROTONIX) 20 MG tablet Take 1 tablet (20 mg total) by mouth daily. 11/26/14   Charlesetta Shanks, MD   BP 130/93 mmHg  Pulse 77  Temp(Src) 99 F (37.2 C) (Oral)  Resp 16  Ht 5\' 8"  (1.727 m)  Wt 148 lb (67.132 kg)  BMI 22.51 kg/m2  SpO2 99% Physical Exam  Constitutional: He is oriented to person, place, and time. He appears well-developed and well-nourished.  HENT:  Head: Normocephalic and atraumatic.  Eyes: EOM are normal. Pupils are equal, round, and reactive to light.  Neck: Neck supple.  Cardiovascular: Normal rate, regular rhythm, normal heart sounds and intact distal pulses.   Pulmonary/Chest: Effort normal and breath sounds normal.  Abdominal: Soft. Bowel sounds are normal. He exhibits no distension. There is no tenderness.  Musculoskeletal: Normal range of motion. He exhibits no edema.  Neurological: He is alert and oriented to person, place, and time. He has normal strength. Coordination normal. GCS eye subscore is 4. GCS verbal subscore is 5. GCS motor subscore is 6.  Skin: Skin is warm, dry and intact.  Psychiatric: He has a normal mood and affect.    ED Course  Procedures (including critical care time) Labs Review Labs Reviewed  BASIC METABOLIC PANEL - Abnormal; Notable for the following:    Potassium 3.4 (*)    Glucose, Bld 102 (*)    Calcium 8.6 (*)    All other components within normal limits  CBC - Abnormal; Notable for the following:    RBC 3.63 (*)    Hemoglobin 11.5 (*)    HCT 34.3  (*)    All other components within normal limits  I-STAT TROPOININ, ED    Imaging Review Ct Angio Chest Aorta W/cm &/or Wo/cm  11/26/2014   CLINICAL DATA:  Acute onset of dull achy pain at the central chest and underneath the left breast. Current history of Wolff-Parkinson-White syndrome. Initial encounter.  EXAM: CT ANGIOGRAPHY CHEST WITH CONTRAST  TECHNIQUE: Multidetector CT imaging of the chest was performed using the standard protocol during bolus administration of intravenous contrast. Multiplanar CT image reconstructions and MIPs were obtained to evaluate the vascular anatomy.  CONTRAST:  163mL OMNIPAQUE IOHEXOL 350 MG/ML SOLN  COMPARISON:  Chest radiograph from 05/27/2014  FINDINGS: There is no evidence of  aortic dissection. There is no evidence of aneurysmal dilatation. No calcific atherosclerotic disease is seen. The great vessels are grossly unremarkable in appearance.  There is no evidence of pulmonary embolus.  Scattered blebs are noted at the right lung apex, with minimal underlying emphysema seen. The lungs are otherwise grossly clear. There is no evidence of significant focal consolidation, pleural effusion or pneumothorax. No masses are identified; no abnormal focal contrast enhancement is seen.  The mediastinum is unremarkable in appearance. No mediastinal lymphadenopathy is seen. No pericardial effusion is identified. No axillary lymphadenopathy is seen. The visualized portions of the thyroid gland are unremarkable in appearance.  The visualized portions of the liver and spleen are unremarkable. Two cystic lesions are noted at the pancreatic body, measuring 2.3 cm and 1.3 cm in size. Would correlate with pancreatic lab values and perform MRCP for further evaluation. The visualized portions of the gallbladder, adrenal glands and kidneys are unremarkable in appearance.  No acute osseous abnormalities are seen.  Review of the MIP images confirms the above findings.  IMPRESSION: 1. No evidence  of aortic dissection. No evidence of aneurysmal dilatation. No calcific atherosclerotic disease seen. 2. No evidence of pulmonary embolus. 3. Scattered blebs at the right lung apex, with minimal underlying emphysema noted. 4. Two cystic lesions seen at the pancreatic body, measuring 2.3 cm and 1.3 cm in size. Malignancy cannot be entirely excluded. Would correlate with pancreatic lab values and recommend MRCP for further evaluation.   Electronically Signed   By: Garald Balding M.D.   On: 11/26/2014 23:09     EKG Interpretation   Date/Time:  Wednesday November 26 2014 20:48:39 EDT Ventricular Rate:  94 PR Interval:  103 QRS Duration: 79 QT Interval:  309 QTC Calculation: 386 R Axis:   77 Text Interpretation:  Sinus rhythm Short PR interval Biatrial enlargement  Abnormal T, consider ischemia, diffuse leads agree. no STEMI Confirmed by  Johnney Killian, MD, Jeannie Done [phone]) on 11/26/2014 11:21:52 PM      MDM   Final diagnoses:  Atypical chest pain  Alcohol-induced chronic pancreatitis   Patient has sudden sharp pain. No associated ischemic type symptoms. He rated the pain as being severe and radiating around to his side and into his back. She does have a history of chronic pancreatitis. CT of the chest for dissection study did identify some pancreatic findings however these are chronic in nature. CT was evaluated rule patient out for dissection, there were no other associated findings to explain the patient's acute pain episode. At this time the patient's EKG is consistent with prior EKGs with nonspecific T-wave changes. There is no ST segment elevation MI present. Cardiac enzymes are negative. Patient does have a history of alcoholic pancreatitis. Consideration is also given to GI etiology such as GERD or esophageal spasm. Patient will be started empirically on Protonix with advised follow-up with his physician this week.    Charlesetta Shanks, MD 11/26/14 2337

## 2014-11-26 NOTE — ED Notes (Signed)
Hx of WPW, was walking this afternoon, sudden onset center chest and underneath left breast, dull and achy, 8/10 chest pain.  No diaphoresis, no nv, no sob.  Pain doesn't change with movement, palpation, or inspiration.  Hx of pancreatitis, but this pain does not feel like that.  12 lead shows no elevation but does have global inverted T waves, globally.  EMS VS:  142/104, 118/76.  Received 2 ntg and had no changes in pain. 2 ntg

## 2014-11-26 NOTE — Discharge Instructions (Signed)

## 2014-11-26 NOTE — ED Notes (Signed)
Dr. Pfeiffer at bedside   

## 2014-11-27 NOTE — ED Notes (Signed)
Pt able to ambulate in room.

## 2014-12-12 ENCOUNTER — Emergency Department (HOSPITAL_COMMUNITY)
Admission: EM | Admit: 2014-12-12 | Discharge: 2014-12-13 | Disposition: A | Discharge status: Home / Self Care | Payer: Self-pay | Attending: Emergency Medicine | Admitting: Emergency Medicine

## 2014-12-12 ENCOUNTER — Encounter (HOSPITAL_COMMUNITY): Payer: Self-pay | Admitting: Emergency Medicine

## 2014-12-12 DIAGNOSIS — Z720 Tobacco use: Secondary | ICD-10-CM | POA: Insufficient documentation

## 2014-12-12 DIAGNOSIS — R011 Cardiac murmur, unspecified: Secondary | ICD-10-CM | POA: Insufficient documentation

## 2014-12-12 DIAGNOSIS — K860 Alcohol-induced chronic pancreatitis: Secondary | ICD-10-CM | POA: Insufficient documentation

## 2014-12-12 DIAGNOSIS — Z79899 Other long term (current) drug therapy: Secondary | ICD-10-CM | POA: Insufficient documentation

## 2014-12-12 DIAGNOSIS — G8929 Other chronic pain: Secondary | ICD-10-CM | POA: Insufficient documentation

## 2014-12-12 DIAGNOSIS — D649 Anemia, unspecified: Secondary | ICD-10-CM | POA: Insufficient documentation

## 2014-12-12 DIAGNOSIS — G43909 Migraine, unspecified, not intractable, without status migrainosus: Secondary | ICD-10-CM | POA: Insufficient documentation

## 2014-12-12 DIAGNOSIS — I10 Essential (primary) hypertension: Secondary | ICD-10-CM | POA: Insufficient documentation

## 2014-12-12 DIAGNOSIS — R109 Unspecified abdominal pain: Secondary | ICD-10-CM

## 2014-12-12 DIAGNOSIS — M1990 Unspecified osteoarthritis, unspecified site: Secondary | ICD-10-CM | POA: Insufficient documentation

## 2014-12-12 DIAGNOSIS — F329 Major depressive disorder, single episode, unspecified: Secondary | ICD-10-CM | POA: Insufficient documentation

## 2014-12-12 DIAGNOSIS — K219 Gastro-esophageal reflux disease without esophagitis: Secondary | ICD-10-CM | POA: Insufficient documentation

## 2014-12-12 DIAGNOSIS — J45909 Unspecified asthma, uncomplicated: Secondary | ICD-10-CM | POA: Insufficient documentation

## 2014-12-12 DIAGNOSIS — F419 Anxiety disorder, unspecified: Secondary | ICD-10-CM | POA: Insufficient documentation

## 2014-12-12 LAB — COMPREHENSIVE METABOLIC PANEL
ALT: 70 U/L — AB (ref 17–63)
AST: 130 U/L — AB (ref 15–41)
Albumin: 3.9 g/dL (ref 3.5–5.0)
Alkaline Phosphatase: 126 U/L (ref 38–126)
Anion gap: 14 (ref 5–15)
BUN: 6 mg/dL (ref 6–20)
CHLORIDE: 101 mmol/L (ref 101–111)
CO2: 25 mmol/L (ref 22–32)
CREATININE: 0.59 mg/dL — AB (ref 0.61–1.24)
Calcium: 8.9 mg/dL (ref 8.9–10.3)
GFR calc Af Amer: >60 mL/min (ref >60)
Glucose, Bld: 89 mg/dL (ref 65–99)
Potassium: 3.8 mmol/L (ref 3.5–5.1)
Sodium: 140 mmol/L (ref 135–145)
TOTAL PROTEIN: 7.3 g/dL (ref 6.5–8.1)
Total Bilirubin: 1.5 mg/dL — ABNORMAL HIGH (ref 0.3–1.2)

## 2014-12-12 LAB — URINALYSIS, ROUTINE W REFLEX MICROSCOPIC
Bilirubin Urine: NEGATIVE
Glucose, UA: NEGATIVE mg/dL
HGB URINE DIPSTICK: NEGATIVE
Ketones, ur: NEGATIVE mg/dL
Leukocytes, UA: NEGATIVE
Nitrite: NEGATIVE
PROTEIN: NEGATIVE mg/dL
Specific Gravity, Urine: 1.008 (ref 1.005–1.030)
UROBILINOGEN UA: 0.2 mg/dL (ref 0.0–1.0)
pH: 8 (ref 5.0–8.0)

## 2014-12-12 LAB — CBC
HCT: 39.8 % (ref 39.0–52.0)
HEMOGLOBIN: 14.1 g/dL (ref 13.0–17.0)
MCH: 32.8 pg (ref 26.0–34.0)
MCHC: 35.4 g/dL (ref 30.0–36.0)
MCV: 92.6 fL (ref 78.0–100.0)
PLATELETS: 229 10*3/uL (ref 150–400)
RBC: 4.3 MIL/uL (ref 4.22–5.81)
RDW: 14 % (ref 11.5–15.5)
WBC: 7.2 10*3/uL (ref 4.0–10.5)

## 2014-12-12 LAB — LIPASE, BLOOD: LIPASE: 24 U/L (ref 22–51)

## 2014-12-12 MED ORDER — GI COCKTAIL ~~LOC~~
30.0000 mL | Freq: Once | ORAL | Status: AC
  Administered 2014-12-12: 30 mL via ORAL
  Filled 2014-12-12: qty 30

## 2014-12-12 MED ORDER — DICYCLOMINE HCL 10 MG/ML IM SOLN
20.0000 mg | Freq: Once | INTRAMUSCULAR | Status: DC
  Filled 2014-12-12: qty 2

## 2014-12-12 MED ORDER — ONDANSETRON 4 MG PO TBDP: 4.0000 mg | ORAL_TABLET | Freq: Once | ORAL | Status: AC | PRN

## 2014-12-12 MED ORDER — KETOROLAC TROMETHAMINE 30 MG/ML IJ SOLN
30.0000 mg | Freq: Once | INTRAMUSCULAR | Status: AC
  Administered 2014-12-12: 30 mg via INTRAVENOUS
  Filled 2014-12-12: qty 1

## 2014-12-12 MED ORDER — SODIUM CHLORIDE 0.9 % IV BOLUS (SEPSIS)
1000.0000 mL | Freq: Once | INTRAVENOUS | Status: AC
  Administered 2014-12-12: 1000 mL via INTRAVENOUS

## 2014-12-12 MED ORDER — ONDANSETRON HCL 4 MG/2ML IJ SOLN
4.0000 mg | Freq: Once | INTRAMUSCULAR | Status: AC
  Administered 2014-12-12: 4 mg via INTRAVENOUS
  Filled 2014-12-12: qty 2

## 2014-12-12 MED ORDER — ONDANSETRON 4 MG PO TBDP
ORAL_TABLET | ORAL | Status: AC
  Filled 2014-12-12: qty 1

## 2014-12-12 NOTE — ED Notes (Signed)
Per EMS: pt from home for eval of LUQ abd pain x2 hours, pt states hx of pancreatitis. Pt reports 6 12 oz beers before pain started, pt states he tried to eat and was unable to keep any foods down due to emesis. Nad noted.

## 2014-12-12 NOTE — ED Provider Notes (Signed)
CSN: [id number]     Arrival date & time 12/12/14  2227 History   First MD Initiated Contact with Patient 12/12/14 2305     Chief Complaint  Patient presents with  . Abdominal Pain     (Consider location/radiation/quality/duration/timing/severity/associated sxs/prior Treatment) HPI   44 year old male with history of alcohol-induced pancreatitis, cocaine abuse, depression, GERD presenting via EMS from home for evaluation of abdominal pain. Patient reports about 4 hours ago he developed pain to his left upper quadrant. He described pain as a sharp and stabbing sensation radiates to his side and to his back similar to prior pain or tightness. He felt nauseous and has vomited multiple times. Unable to keep any of his food down. Pain is currently moderate to severe. He admits to drinking six 12 ounce beer today prior to the pain.  He denies having fever, chills, chest pain, shortness of breath, productive cough, hemoptysis, hematemesis, hematochezia or black stools. Denies any dysuria, or hematuria.. Denies any strenuous activities of recent injury. No history of kidney stone. Patient currently requesting for narcotic pain medication.   Past Medical History  Diagnosis Date  . Hypertension   . Asthma   . Pancreatitis   . Cocaine abuse   . Depression   . H/O suicide attempt   . Heart murmur     "when he was little" (03/06/2013)  . Shortness of breath     "can happen at anytime" (03/06/2013)  . Anemia   . H/O hiatal hernia   . GERD (gastroesophageal reflux disease)   . Anxiety   . WPW (Wolff-Parkinson-White syndrome)     [name] 03/06/2013  . High cholesterol   . Migraine     "monthly" (01/30/2014)  . Arthritis     "knees; arms; elbows" (01/30/2014)  . Femoral condyle fracture 03/08/2014    left medial/notes 03/09/2014  . Alcoholism /alcohol abuse    Past Surgical History  Procedure Laterality Date  . Facial fracture surgery Left 1990's    "result of trauma"   . Eye surgery Left  1990's    "result of trauma"   . Left heart catheterization with coronary angiogram Right 03/07/2013    Procedure: LEFT HEART CATHETERIZATION WITH CORONARY ANGIOGRAM;  Surgeon: [name], MD;  Location: [facility];  Service: Cardiovascular;  Laterality: Right;   Family History  Problem Relation Age of Onset  . Hypertension Other   . Coronary artery disease Other    History  Substance Use Topics  . Smoking status: Current Every Day Smoker -- 0.50 packs/day for 30 years    Types: Cigarettes  . Smokeless tobacco: Current User    Types: Chew  . Alcohol Use: Yes     Comment: last drink monday    Review of Systems  All other systems reviewed and are negative.     Allergies  Shellfish-derived products and Trazodone and nefazodone  Home Medications   Prior to Admission medications   Medication Sig Start Date End Date Taking? Authorizing Provider  albuterol (PROVENTIL HFA;VENTOLIN HFA) 108 (90 BASE) MCG/ACT inhaler Inhale 2 puffs into the lungs every 6 (six) hours as needed for wheezing or shortness of breath (wheezing).     Historical Provider, MD  diphenhydrAMINE (BENADRYL) 25 MG tablet Take 25 mg by mouth every 6 (six) hours as needed for allergies.    Historical Provider, MD  folic acid (FOLVITE) 1 MG tablet Take 1 tablet (1 mg total) by mouth daily. 03/21/14   Albertine Patricia, MD  lipase/protease/amylase (  CREON) 12000 UNITS CPEP capsule Take 2 capsules (24,000 Units total) by mouth 3 (three) times daily with meals. 02/06/14   [name], MD  loperamide (IMODIUM) 2 MG capsule Take 1-2 capsules (2-4 mg total) by mouth as needed for diarrhea or loose stools. 01/23/14   [name], PA-C  loratadine (CLARITIN) 10 MG tablet Take 1 tablet (10 mg total) by mouth daily. Patient taking differently: Take 10 mg by mouth daily as needed for allergies.  02/25/14   [name], PA-C  metoprolol tartrate (LOPRESSOR) 25 MG tablet Take 1 tablet (25 mg total) by mouth 2  (two) times daily. 02/06/14   [name], MD  Multiple Vitamin (MULTIVITAMIN) capsule Take 1 capsule by mouth daily. 02/06/14   [name], MD  omeprazole (PRILOSEC) 20 MG capsule Take 1 capsule (20 mg total) by mouth daily. 08/26/14   [name], PA-C  ondansetron (ZOFRAN ODT) 4 MG disintegrating tablet Take 1 tablet (4 mg total) by mouth every 8 (eight) hours as needed for nausea or vomiting. 08/26/14   [name], PA-C  pantoprazole (PROTONIX) 20 MG tablet Take 1 tablet (20 mg total) by mouth daily. 11/26/14   Charlesetta Shanks, MD  QUEtiapine (SEROQUEL) 100 MG tablet Take 1 tablet (100 mg total) by mouth at bedtime. For mood control 01/15/14   Jonetta Osgood, MD  sertraline (ZOLOFT) 25 MG tablet Take 75 mg by mouth daily.    Historical Provider, MD  sucralfate (CARAFATE) 1 G tablet Take 1 tablet (1 g total) by mouth 4 (four) times daily -  with meals and at bedtime. 08/19/14   [name], MD   BP 123/96 mmHg  Pulse 89  Temp(Src) 97.9 F (36.6 C) (Oral)  Resp 16  SpO2 98% Physical Exam  Constitutional: He appears well-developed and well-nourished. No distress.  HENT:  Head: Atraumatic.  Eyes: Conjunctivae are normal.  Neck: Neck supple.  Cardiovascular: Normal rate and regular rhythm.   Pulmonary/Chest: Effort normal and breath sounds normal.  Abdominal: Soft. Bowel sounds are normal. He exhibits no distension. There is tenderness (Tenderness to left upper quadrant on palpation without guarding or rebound tenderness.).  Genitourinary:  No CVA tenderness.  Neurological: He is alert.  Skin: No rash noted.  Psychiatric: He has a normal mood and affect.  Nursing note and vitals reviewed.   ED Course  Procedures (including critical care time)  Patient presents with acute on chronic left upper quadrant abdominal pain similar to prior alcohol-induced pancreatitis. He has a nonsurgical abdomen, afebrile, and stable vital sign. Plan to give patient IV fluids, Bentyl,  GI cocktail, and Zofran. Patient requesting for Dilaudid I do not think it is appropriate to treat chronic abdominal pain with narcotic pain medication. Patient made aware of my plan.  12:19 AM Patient has normal lipase, labs are reassuring. Mild transaminitis but patient has had this in the past. I recommend alcohol cessation as and worsen his symptoms. We'll continue to treat his symptoms until he can tolerates by mouth and stable for discharge. Patient is aware of plan. Patient did report some improvement with Toradol and requesting further.  1:59 AM sxs is currently controlled.  Pt tolerates PO. Recommend f/u with PCP for further care.  Alcohol cessation reinterated.   Labs Review Labs Reviewed  COMPREHENSIVE METABOLIC PANEL - Abnormal; Notable for the following:    Creatinine, Ser 0.59 (*)    AST 130 (*)    ALT 70 (*)    Total Bilirubin  1.5 (*)    All other components within normal limits  LIPASE, BLOOD  CBC  URINALYSIS, ROUTINE W REFLEX MICROSCOPIC (NOT AT [facility])    Imaging Review No results found.   EKG Interpretation None      MDM   Final diagnoses:  Chronic abdominal pain  Alcohol-induced chronic pancreatitis    BP 113/63 mmHg  Pulse 90  Temp(Src) 97.9 F (36.6 C) (Oral)  Resp 18  SpO2 96%     Domenic Moras, PA-C 12/13/14 0159  Ezequiel Essex, MD 12/13/14 [id number]

## 2014-12-13 MED ORDER — PROMETHAZINE HCL 25 MG PO TABS: 25.0000 mg | ORAL_TABLET | Freq: Four times a day (QID) | ORAL | Status: DC | PRN

## 2014-12-13 MED ORDER — KETOROLAC TROMETHAMINE 30 MG/ML IJ SOLN
30.0000 mg | Freq: Once | INTRAMUSCULAR | Status: AC
  Administered 2014-12-13: 30 mg via INTRAVENOUS
  Filled 2014-12-13: qty 1

## 2014-12-13 MED ORDER — METOCLOPRAMIDE HCL 5 MG/ML IJ SOLN
10.0000 mg | Freq: Once | INTRAMUSCULAR | Status: AC
  Administered 2014-12-13: 10 mg via INTRAVENOUS
  Filled 2014-12-13: qty 2

## 2014-12-13 NOTE — ED Notes (Signed)
Pt stable, ambulatory, states understanding of discharge instructions 

## 2014-12-13 NOTE — ED Notes (Signed)
Patient stated he ate 1 bite of his [place] sandwich and drank 3/4 can of ginger ale and "feels a little better"  Patient was sleeping with the cover over his head and TV off.

## 2014-12-13 NOTE — ED Notes (Signed)
Ginger ale given for PO challenge, [place] sandwich given upon request of patient.  Instructed patient to wait until after he drinks the ginger ale to eat the sandwich

## 2014-12-13 NOTE — ED Notes (Signed)
Pt drank about half of a ginger ale, continues to struggle with nausea.

## 2014-12-13 NOTE — Discharge Instructions (Signed)
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  Please avoid drinking alcohol as it can worsen your pain.  Take zofran or phenergan as needed for nausea.  Follow up with your doctor for further care.  Acute Pancreatitis Acute pancreatitis is a disease in which the pancreas becomes suddenly inflamed. The pancreas is a large gland located behind your stomach. The pancreas produces enzymes that help digest food. The pancreas also releases the hormones glucagon and insulin that help regulate blood sugar. Damage to the pancreas occurs when the digestive enzymes from the pancreas are activated and begin attacking the pancreas before being released into the intestine. Most acute attacks last a couple of days and can cause serious complications. Some people become dehydrated and develop low blood pressure. In severe cases, bleeding into the pancreas can lead to shock and can be life-threatening. The lungs, heart, and kidneys may fail. CAUSES  Pancreatitis can happen to anyone. In some cases, the cause is unknown. Most cases are caused by:  Alcohol abuse.  Gallstones. Other less common causes are:  Certain medicines.  Exposure to certain chemicals.  Infection.  Damage caused by an accident (trauma).  Abdominal surgery. SYMPTOMS   Pain in the upper abdomen that may radiate to the back.  Tenderness and swelling of the abdomen.  Nausea and vomiting. DIAGNOSIS  Your caregiver will perform a physical exam. Blood and stool tests may be done to confirm the diagnosis. Imaging tests may also be done, such as X-rays, CT scans, or an ultrasound of the abdomen. TREATMENT  Treatment usually requires a stay in the hospital. Treatment may include:  Pain medicine.  Fluid replacement through an intravenous line (IV).  Placing a tube in the stomach to remove stomach contents and control vomiting.  Not eating for 3 or 4 days. This gives your pancreas a rest, because enzymes are not being  produced that can cause further damage.  Antibiotic medicines if your condition is caused by an infection.  Surgery of the pancreas or gallbladder. HOME CARE INSTRUCTIONS   Follow the diet advised by your caregiver. This may involve avoiding alcohol and decreasing the amount of fat in your diet.  Eat smaller, more frequent meals. This reduces the amount of digestive juices the pancreas produces.  Drink enough fluids to keep your urine clear or pale yellow.  Only take over-the-counter or prescription medicines as directed by your caregiver.  Avoid drinking alcohol if it caused your condition.  Do not smoke.  Get plenty of rest.  Check your blood sugar at home as directed by your caregiver.  Keep all follow-up appointments as directed by your caregiver. SEEK MEDICAL CARE IF:   You do not recover as quickly as expected.  You develop new or worsening symptoms.  You have persistent pain, weakness, or nausea.  You recover and then have another episode of pain. SEEK IMMEDIATE MEDICAL CARE IF:   You are unable to eat or keep fluids down.  Your pain becomes severe.  You have a fever or persistent symptoms for more than 2 to 3 days.  You have a fever and your symptoms suddenly get worse.  Your skin or the white part of your eyes turn yellow (jaundice).  You develop vomiting.  You feel dizzy, or you faint.  Your blood sugar is high (over 300 mg/dL). MAKE SURE YOU:   Understand these instructions.  Will watch your condition.  Will get help right away if you are not doing well or get worse.  Document Released: 04/25/2005 Document Revised: 10/25/2011 Document Reviewed: 08/04/2011 [facility] Patient Information 2015 [place], [place]. This information is not intended to replace advice given to you by your health care provider. Make sure you discuss any questions you have with your health care provider.

## 2014-12-15 ENCOUNTER — Emergency Department (HOSPITAL_COMMUNITY)
Admission: EM | Admit: 2014-12-15 | Discharge: 2014-12-15 | Disposition: A | Discharge status: Home / Self Care | Payer: Self-pay | Attending: Emergency Medicine | Admitting: Emergency Medicine

## 2014-12-15 ENCOUNTER — Encounter (HOSPITAL_COMMUNITY): Payer: Self-pay | Admitting: *Deleted

## 2014-12-15 DIAGNOSIS — J45909 Unspecified asthma, uncomplicated: Secondary | ICD-10-CM | POA: Insufficient documentation

## 2014-12-15 DIAGNOSIS — Z720 Tobacco use: Secondary | ICD-10-CM | POA: Insufficient documentation

## 2014-12-15 DIAGNOSIS — K219 Gastro-esophageal reflux disease without esophagitis: Secondary | ICD-10-CM | POA: Insufficient documentation

## 2014-12-15 DIAGNOSIS — G43909 Migraine, unspecified, not intractable, without status migrainosus: Secondary | ICD-10-CM | POA: Insufficient documentation

## 2014-12-15 DIAGNOSIS — R011 Cardiac murmur, unspecified: Secondary | ICD-10-CM | POA: Insufficient documentation

## 2014-12-15 DIAGNOSIS — Z79899 Other long term (current) drug therapy: Secondary | ICD-10-CM | POA: Insufficient documentation

## 2014-12-15 DIAGNOSIS — F419 Anxiety disorder, unspecified: Secondary | ICD-10-CM | POA: Insufficient documentation

## 2014-12-15 DIAGNOSIS — F329 Major depressive disorder, single episode, unspecified: Secondary | ICD-10-CM | POA: Insufficient documentation

## 2014-12-15 DIAGNOSIS — I10 Essential (primary) hypertension: Secondary | ICD-10-CM | POA: Insufficient documentation

## 2014-12-15 DIAGNOSIS — K860 Alcohol-induced chronic pancreatitis: Secondary | ICD-10-CM | POA: Insufficient documentation

## 2014-12-15 DIAGNOSIS — E782 Mixed hyperlipidemia: Secondary | ICD-10-CM | POA: Insufficient documentation

## 2014-12-15 DIAGNOSIS — M1990 Unspecified osteoarthritis, unspecified site: Secondary | ICD-10-CM | POA: Insufficient documentation

## 2014-12-15 DIAGNOSIS — Z8781 Personal history of (healed) traumatic fracture: Secondary | ICD-10-CM | POA: Insufficient documentation

## 2014-12-15 DIAGNOSIS — D649 Anemia, unspecified: Secondary | ICD-10-CM | POA: Insufficient documentation

## 2014-12-15 LAB — COMPREHENSIVE METABOLIC PANEL
ALT: 73 U/L — AB (ref 17–63)
AST: 101 U/L — AB (ref 15–41)
Albumin: 4.2 g/dL (ref 3.5–5.0)
Alkaline Phosphatase: 131 U/L — ABNORMAL HIGH (ref 38–126)
Anion gap: 14 (ref 5–15)
BILIRUBIN TOTAL: 1.2 mg/dL (ref 0.3–1.2)
BUN: 9 mg/dL (ref 6–20)
CO2: 21 mmol/L — ABNORMAL LOW (ref 22–32)
Calcium: 9.1 mg/dL (ref 8.9–10.3)
Chloride: 101 mmol/L (ref 101–111)
Creatinine, Ser: 0.77 mg/dL (ref 0.61–1.24)
GLUCOSE: 97 mg/dL (ref 65–99)
POTASSIUM: 4.4 mmol/L (ref 3.5–5.1)
Sodium: 136 mmol/L (ref 135–145)
Total Protein: 8 g/dL (ref 6.5–8.1)

## 2014-12-15 LAB — CBC
HCT: 41 % (ref 39.0–52.0)
Hemoglobin: 14.2 g/dL (ref 13.0–17.0)
MCH: 32.6 pg (ref 26.0–34.0)
MCHC: 34.6 g/dL (ref 30.0–36.0)
MCV: 94 fL (ref 78.0–100.0)
PLATELETS: 224 10*3/uL (ref 150–400)
RBC: 4.36 MIL/uL (ref 4.22–5.81)
RDW: 13.5 % (ref 11.5–15.5)
WBC: 5.7 10*3/uL (ref 4.0–10.5)

## 2014-12-15 LAB — URINALYSIS, ROUTINE W REFLEX MICROSCOPIC
Bilirubin Urine: NEGATIVE
Glucose, UA: NEGATIVE mg/dL
Hgb urine dipstick: NEGATIVE
KETONES UR: NEGATIVE mg/dL
LEUKOCYTES UA: NEGATIVE
Nitrite: NEGATIVE
PH: 5 (ref 5.0–8.0)
Protein, ur: NEGATIVE mg/dL
Specific Gravity, Urine: 1.011 (ref 1.005–1.030)
Urobilinogen, UA: 0.2 mg/dL (ref 0.0–1.0)

## 2014-12-15 LAB — LIPASE, BLOOD: LIPASE: 55 U/L — AB (ref 22–51)

## 2014-12-15 MED ORDER — OXYCODONE HCL 5 MG PO TABS
5.0000 mg | ORAL_TABLET | Freq: Once | ORAL | Status: AC
  Administered 2014-12-15: 5 mg via ORAL
  Filled 2014-12-15: qty 1

## 2014-12-15 MED ORDER — ONDANSETRON HCL 8 MG PO TABS
8.0000 mg | ORAL_TABLET | Freq: Three times a day (TID) | ORAL | Status: DC | PRN
Start: 2014-12-15 — End: 2015-01-06

## 2014-12-15 MED ORDER — GI COCKTAIL ~~LOC~~
30.0000 mL | Freq: Once | ORAL | Status: AC
  Administered 2014-12-15: 30 mL via ORAL
  Filled 2014-12-15: qty 30

## 2014-12-15 MED ORDER — ONDANSETRON 4 MG PO TBDP
4.0000 mg | ORAL_TABLET | Freq: Once | ORAL | Status: AC | PRN
  Administered 2014-12-15: 4 mg via ORAL

## 2014-12-15 MED ORDER — ONDANSETRON 4 MG PO TBDP
8.0000 mg | ORAL_TABLET | Freq: Once | ORAL | Status: AC
  Administered 2014-12-15: 8 mg via ORAL
  Filled 2014-12-15: qty 2

## 2014-12-15 MED ORDER — ONDANSETRON 4 MG PO TBDP
ORAL_TABLET | ORAL | Status: AC
  Filled 2014-12-15: qty 1

## 2014-12-15 NOTE — Discharge Instructions (Signed)
Acute Pancreatitis Take the medication prescribed as needed for nausea. [facility] to be [facility] to get your prescriptions filled. You must avoid alcohol. If you have a problem with alcohol get help. Call any of the numbers on the resource guide. Acute pancreatitis is a disease in which the pancreas becomes suddenly inflamed. The pancreas is a large gland located behind your stomach. The pancreas produces enzymes that help digest food. The pancreas also releases the hormones glucagon and insulin that help regulate blood sugar. Damage to the pancreas occurs when the digestive enzymes from the pancreas are activated and begin attacking the pancreas before being released into the intestine. Most acute attacks last a couple of days and can cause serious complications. Some people become dehydrated and develop low blood pressure. In severe cases, bleeding into the pancreas can lead to shock and can be life-threatening. The lungs, heart, and kidneys may fail. CAUSES  Pancreatitis can happen to anyone. In some cases, the cause is unknown. Most cases are caused by:  Alcohol abuse.  Gallstones. Other less common causes are:  Certain medicines.  Exposure to certain chemicals.  Infection.  Damage caused by an accident (trauma).  Abdominal surgery. SYMPTOMS   Pain in the upper abdomen that may radiate to the back.  Tenderness and swelling of the abdomen.  Nausea and vomiting. DIAGNOSIS  Your caregiver will perform a physical exam. Blood and stool tests may be done to confirm the diagnosis. Imaging tests may also be done, such as X-rays, CT scans, or an ultrasound of the abdomen. TREATMENT  Treatment usually requires a stay in the hospital. Treatment may include:  Pain medicine.  Fluid replacement through an intravenous line (IV).  Placing a tube in the stomach to remove stomach contents and control vomiting.  Not eating for 3 or 4 days.  This gives your pancreas a rest, because enzymes are not being produced that can cause further damage.  Antibiotic medicines if your condition is caused by an infection.  Surgery of the pancreas or gallbladder. HOME CARE INSTRUCTIONS   Follow the diet advised by your caregiver. This may involve avoiding alcohol and decreasing the amount of fat in your diet.  Eat smaller, more frequent meals. This reduces the amount of digestive juices the pancreas produces.  Drink enough fluids to keep your urine clear or pale yellow.  Only take over-the-counter or prescription medicines as directed by your caregiver.  Avoid drinking alcohol if it caused your condition.  Do not smoke.  Get plenty of rest.  Check your blood sugar at home as directed by your caregiver.  Keep all follow-up appointments as directed by your caregiver. SEEK MEDICAL CARE IF:   You do not recover as quickly as expected.  You develop new or worsening symptoms.  You have persistent pain, weakness, or nausea.  You recover and then have another episode of pain. SEEK IMMEDIATE MEDICAL CARE IF:   You are unable to eat or keep fluids down.  Your pain becomes severe.  You have a fever or persistent symptoms for more than 2 to 3 days.  You have a fever and your symptoms suddenly get worse.  Your skin or the white part of your eyes turn yellow (jaundice).  You develop vomiting.  You feel dizzy, or you faint.  Your blood sugar is high (over 300 mg/dL). MAKE SURE YOU:   Understand these instructions.  Will watch your condition.  Will get help  right away if you are not doing well or get worse. Document Released: 04/25/2005 Document Revised: 10/25/2011 Document Reviewed: 08/04/2011 [facility] Patient Information 2015 [place], [place]. This information is not intended to replace advice given to you by your health care provider. Make sure you discuss any questions you have with your health care provider.  Emergency  Department Resource Guide 1) Find a Doctor and Pay Out of Pocket Although you won't have to find out who is covered by your insurance plan, it is a good idea to ask around and get recommendations. You will then need to call the office and see if the doctor you have chosen will accept you as a new patient and what types of options they offer for patients who are self-pay. Some doctors offer discounts or will set up payment plans for their patients who do not have insurance, but you will need to ask so you aren't surprised when you get to your appointment.  2) Contact Your Local Health Department Not all health departments have doctors that can see patients for sick visits, but many do, so it is worth a call to see if yours does. If you don't know where your local health department is, you can check in your phone book. The CDC also has a tool to help you locate your state's health department, and many state websites also have listings of all of their local health departments.  3) Find a [facility] If your illness is not likely to be very severe or complicated, you may want to try a walk in clinic. These are popping up all over the country in pharmacies, drugstores, and shopping centers. They're usually staffed by nurse practitioners or physician assistants that have been trained to treat common illnesses and complaints. They're usually fairly quick and inexpensive. However, if you have serious medical issues or chronic medical problems, these are probably not your best option.  No Primary Care Doctor: - Call Health Connect at  [phone] - they can help you locate a primary care doctor that  accepts your insurance, provides certain services, etc. - Physician Referral Service- [phone]  Chronic Pain Problems: Organization         Address  Phone   Notes  [facility]  [phone] Patients need to be referred by their primary care doctor.   Medication  Assistance: Organization         Address  Phone   Notes  [facility] Medication [facility] [address] [place], [place] [address] [phone] --Must be a resident of [facility] -- Must have NO insurance coverage whatsoever (no Medicaid/ Medicare, etc.) -- The pt. MUST have a primary care doctor that directs their care regularly and follows them in the community   MedAssist  [phone]   [employer]  [phone]    Agencies that provide inexpensive medical care: Organization         Address  Phone   Notes  [place]  [phone]   [facility]    [phone]   [facility] [place], [place] [address] [phone]   [place] [address], [place] [phone]   Planned Parenthood    [phone]   [facility]    [phone]   [place] and [place], [place] Phone:  [phone], Fax:  [phone]  Hours of Operation:  9 am - 6 pm, M-F.  Also accepts Medicaid/Medicare and self-pay.  [facility] [place] for [place] [address], [address], [place] Phone: [phone], Fax: [phone]. Hours of Operation:  8:30 am - 5:30 pm, M-F.  Also accepts Medicaid and self-pay.  [facility] [place] [address], [place] Phone: [phone]   [place], [place], [place] [phone], Ext. [phone] Mondays & Thursdays: 7-9 AM.  First 15 patients are seen on a first come, first serve basis.    [place] Providers:  Organization         Address  Phone   Notes  [facility] [address], [place] [phone] Also accepts self-pay patients.  [facility] [address], [place]  [phone]   [place], [address], [place]  [phone]   [facility] [address], [place] [phone]   Lucianne Lei [address], [place]   [phone] Only accepts [place] Access [place] patients after they have their name applied to their card.   Self-Pay (no insurance) in [facility]:  Organization         Address  Phone   Notes  Sickle Cell Patients, [facility] [place] [phone]   [facility] [place] [phone]   [facility] [place]  [place], [place], [place] [phone]   [facility]/Dr. Osei-Bonsu  [address] [place] or [address], [place] [phone] Phone number for both [place] and [place] locations is the same.  Urgent Medical and [facility] [address], [place] [phone]   [facility] [address] [place] or [address] [phone]   [facility] [address] [place] [phone], phone; [phone], fax Sees patients 1st and 3rd Saturday of every month.  Must not qualify for public or private insurance (i.e. Medicaid, Medicare, [place] Health Choice, Veterans' Benefits)  Household income should be no more than 200% of the poverty level The clinic cannot treat you if you are pregnant or think you are pregnant  Sexually transmitted diseases are not treated at the clinic.    Dental Care: Organization         Address  Phone  Notes  [facility] Department of [facility] [place] [phone] Accepts children up to age 59 who are enrolled in [place] or [place]; pregnant women with a Medicaid card; and children who have applied for Medicaid or [place] Health Choice, but were declined, whose parents can pay a reduced fee at time of service.  [facility]  Department of [facility]  [address], [place] [phone] Accepts children up to age 75 who are enrolled in [place] or [place]; pregnant women with a Medicaid card; and children who have applied for Medicaid or [place] Health Choice, but were declined, whose parents can pay a reduced fee at time of service.  [place] Adult Dental Access PROGRAM  [place] [phone] Patients are seen by appointment only. Walk-ins are not accepted. [place] will  see patients 42 years of age and older. Monday - Tuesday (8am-5pm) Most Wednesdays (8:30-5pm) $30 per visit, cash only  [facility] Adult Dental Access PROGRAM  [address], [facility] [phone] Patients are seen by appointment only. Walk-ins are not accepted. [place] will see patients 30 years of age and older. One Wednesday Evening (Monthly: Volunteer Based).  $30 per visit, cash only  [place]  [phone] for adults; Children under age 97, call Graduate Pediatric Dentistry at [phone]. Children aged 70-14, please call [phone] to request a pediatric application.  Dental services are provided in all areas of dental care including fillings, crowns and bridges, complete and partial dentures, implants, gum treatment, root canals, and extractions. Preventive care is also provided. Treatment is provided to both adults and children. Patients are selected via a lottery and there is often a waiting list.   [facility] [address], [place]  [phone] [email or web address]   [facility] [address] [place], [place] [phone], Ext. [phone] Second and Fourth Thursday of each month, opens at 6:30 AM; Clinic ends at 9 AM.  Patients are seen on a first-come first-served basis, and a limited number are seen during each clinic.   [facility]  [address] Hillard Danker [place], [place] [phone]    Eligibility Requirements You must have lived in [place], [place], or [place] counties for at least the last three months.   You cannot be eligible for state or federal sponsored [employer], including [employer], [place], or [employer].   You generally cannot be eligible for healthcare insurance through your employer.    How to apply: Eligibility screenings are held every Tuesday and Wednesday afternoon from 1:00 pm until 4:00 pm. You do not need an appointment for the interview!  [facility] [address] [place], [place]   [place]  [place] Department  [place]  [phone]    Behavioral Health Resources in the Community: Intensive Outpatient Programs Organization         Address  Phone  Notes  [place]. [address], [place], [place] [phone]   [facility] [address], [place], [place]   ADS: Alcohol & Drug S[facility] [address] [place], [place]   [place] [address]  [place], [place] or [phone]   Substance Abuse Resources Organization         Address  Phone  Notes  Alcohol and Drug Services  [phone]   [place]  [phone]   [facility] [place]   Chinita Pester  [phone]   Residential & Outpatient Substance Abuse Program  7753528989   Psychological Services Organization         Address  Phone  Notes  [facility] [place]  [place]  [phone]   [place] [address] [name] or [phone]    Mobile Crisis Teams Organization         Address  Phone  Notes  [facility], Mobile Crisis Care Unit  [phone]   [facility] Psychotherapeutic Services  [address]  [place], [place]   [facility] [address] [place] [phone]    Self-Help/Support Groups Organization  Address  Phone             Notes  [place]. of [place] - variety of support groups  [place] Call for more information  Narcotics Anonymous (NA), [facility] [address], [employer] [place]  2 meetings at this location   Special educational needs teacher         Address  Phone  Notes  [facility] Residential Treatment [place],    [place]  [phone]   [facility]  [address], [place] [address], [place], [place]   [place], [place] [phone] Admissions: 8am-3pm M-F  Incentives Substance [place] [address],    [place], [place] [phone]   [facility] [address] [place], [place], [place]   [facility] [address],  [place], [place]   [facility] Programs - Intensive Outpatient [address], [place], [place]   [facility] [place].,  [place], [place] [phone] or [phone]   [facility] Treatment Services (RTS) [address] [place], [place] Accepts Medicaid  [facility] [place] [address],  [place] [phone] Substance Abuse/Addiction Treatment   [facility] Organization         Address  Phone  Notes  [facility]  [phone]   Domenic Schwab, PhD [address] Arlis Porta [place], [place]   [phone] or [phone]   Reed Stark [place], [place] [phone]   [facility] [address], [place], [place] [phone] Insurance/Medicaid/sponsorship through [facility] and Families [address], [address] [place]                                    [place], [place] [phone] [place] [address], [place] [phone]    Dr. Adele Schilder  [phone]   [facility] [place] Dept. 1) [address], [place] 2) [place] 3)  [place] [address], [place] [phone]  [phone]   [place] [phone] or [phone] (After Hours)

## 2014-12-15 NOTE — ED Notes (Signed)
Pt was brought in by [facility] for abdominal pain and pancreatitis.  Pt states now not keeping anything down.  Symptoms for last 4 hours.  No ETOH since Thursday and states was drinking 6PPD.

## 2014-12-15 NOTE — Care Management Note (Signed)
Case Management Note  Patient Details  Name: [name] MRN: [id number] Date of Birth: 04-05-1971  Subjective/Objective:  Patient presents to [facility] ED with abdominal pains, Patient [facility] in the past. Patient was recently released from jail and has not had any follow up                   Action/Plan:   Referral to [facility] for follow up       Expected Discharge Date:     12/15/14             Expected Discharge Plan:     Schedule appt for follow up 12/17/14 at 1130 patient agreeable   In-House Referral:      Discharge planning Services     Post Acute Care Choice:    Choice offered to:     DME Arranged:    DME Agency:     HH Arranged:    [place] Agency:     Status of Service:   completed       Medicare Important Message Given:    Date Medicare IM Given:    Medicare IM give by:    Date Additional Medicare IM Given:    Additional Medicare Important Message give by:     If discussed at [place] of Stay Meetings, dates discussed:    Additional CommentsLaurena Slimmer, RN 12/15/2014, 9:55 PM

## 2014-12-15 NOTE — ED Provider Notes (Signed)
CSN: [id number]     Arrival date & time 12/15/14  1335 History   First MD Initiated Contact with Patient 12/15/14 1926     Chief Complaint  Patient presents with  . Abdominal Pain     (Consider location/radiation/quality/duration/timing/severity/associated sxs/prior Treatment) HPI [place] of left upper quadrant pain onset 3 days ago after drinking alcohol. Pain feels like Tegretol is 7 the past. He reports vomiting 3 times today. No hematemesis Pain is mild at present. Burning in nature, nonradiating. Presently complains of mild nausea. Nothing makes symptoms better or worse. He has had similar symptoms in the past after drinking alcohol. No other associated symptoms. Patient seen in the emergency department 12/12/2014 for same complaint. Treated and released. Denies urinary symptoms. Denies other complaint. No lightheadedness with standing no blood per rectum no hematemesis or no treatment prior to coming here Past Medical History  Diagnosis Date  . Hypertension   . Asthma   . Pancreatitis   . Cocaine abuse   . Depression   . H/O suicide attempt   . Heart murmur     "when he was little" (03/06/2013)  . Shortness of breath     "can happen at anytime" (03/06/2013)  . Anemia   . H/O hiatal hernia   . GERD (gastroesophageal reflux disease)   . Anxiety   . WPW (Wolff-Parkinson-White syndrome)     [name] 03/06/2013  . High cholesterol   . Migraine     "monthly" (01/30/2014)  . Arthritis     "knees; arms; elbows" (01/30/2014)  . Femoral condyle fracture 03/08/2014    left medial/notes 03/09/2014  . Alcoholism /alcohol abuse    Past Surgical History  Procedure Laterality Date  . Facial fracture surgery Left 1990's    "result of trauma"   . Eye surgery Left 1990's    "result of trauma"   . Left heart catheterization with coronary angiogram Right 03/07/2013    Procedure: LEFT HEART CATHETERIZATION WITH CORONARY ANGIOGRAM;  Surgeon: [name], MD;  Location: [place];  Service:  Cardiovascular;  Laterality: Right;   Family History  Problem Relation Age of Onset  . Hypertension Other   . Coronary artery disease Other    History  Substance Use Topics  . Smoking status: Current Every Day Smoker -- 0.50 packs/day for 30 years    Types: Cigarettes  . Smokeless tobacco: Current User    Types: Chew  . Alcohol Use: 3.6 oz/week    6 Cans of beer per week     Comment: Last drink Thursday 12/11/14 per patient    Review of Systems  Gastrointestinal: Positive for nausea, vomiting and abdominal pain.  All other systems reviewed and are negative.     Allergies  Shellfish-derived products and Trazodone and nefazodone  Home Medications   Prior to Admission medications   Medication Sig Start Date End Date Taking? Authorizing Provider  albuterol (PROVENTIL HFA;VENTOLIN HFA) 108 (90 BASE) MCG/ACT inhaler Inhale 2 puffs into the lungs every 6 (six) hours as needed for wheezing or shortness of breath (wheezing).     Historical Provider, MD  diphenhydrAMINE (BENADRYL) 25 MG tablet Take 25 mg by mouth every 6 (six) hours as needed for allergies.    Historical Provider, MD  folic acid (FOLVITE) 1 MG tablet Take 1 tablet (1 mg total) by mouth daily. 03/21/14   [name], MD  lipase/protease/amylase (CREON) 12000 UNITS CPEP capsule Take 2 capsules (24,000 Units total) by mouth 3 (three) times daily with  meals. 02/06/14   [name], MD  loperamide (IMODIUM) 2 MG capsule Take 1-2 capsules (2-4 mg total) by mouth as needed for diarrhea or loose stools. 01/23/14   [name], PA-C  loratadine (CLARITIN) 10 MG tablet Take 1 tablet (10 mg total) by mouth daily. Patient taking differently: Take 10 mg by mouth daily as needed for allergies.  02/25/14   [name], PA-C  metoprolol tartrate (LOPRESSOR) 25 MG tablet Take 1 tablet (25 mg total) by mouth 2 (two) times daily. 02/06/14   [name], MD  Multiple Vitamin (MULTIVITAMIN) capsule Take 1 capsule  by mouth daily. 02/06/14   [name], MD  omeprazole (PRILOSEC) 20 MG capsule Take 1 capsule (20 mg total) by mouth daily. 08/26/14   [name], PA-C  ondansetron (ZOFRAN ODT) 4 MG disintegrating tablet Take 1 tablet (4 mg total) by mouth every 8 (eight) hours as needed for nausea or vomiting. 08/26/14   [name], PA-C  pantoprazole (PROTONIX) 20 MG tablet Take 1 tablet (20 mg total) by mouth daily. 11/26/14   Charlesetta Shanks, MD  promethazine (PHENERGAN) 25 MG tablet Take 1 tablet (25 mg total) by mouth every 6 (six) hours as needed for nausea. 12/13/14   Domenic Moras, PA-C  QUEtiapine (SEROQUEL) 100 MG tablet Take 1 tablet (100 mg total) by mouth at bedtime. For mood control 01/15/14   Jonetta Osgood, MD  sertraline (ZOLOFT) 25 MG tablet Take 75 mg by mouth daily.    Historical Provider, MD  sucralfate (CARAFATE) 1 G tablet Take 1 tablet (1 g total) by mouth 4 (four) times daily -  with meals and at bedtime. 08/19/14   [name], MD   BP 122/106 mmHg  Pulse 95  Temp(Src) 97.8 F (36.6 C) (Oral)  Resp 16  SpO2 100% Physical Exam  Constitutional: He appears well-developed and well-nourished. No distress.  HENT:  Head: Normocephalic and atraumatic.  Eyes: Conjunctivae are normal. Pupils are equal, round, and reactive to light.  Neck: Neck supple. No tracheal deviation present. No thyromegaly present.  Cardiovascular: Normal rate and regular rhythm.   No murmur heard. Pulmonary/Chest: Effort normal and breath sounds normal.  Abdominal: Soft. Bowel sounds are normal. He exhibits mass. He exhibits no distension. There is tenderness. There is no rebound and no guarding.  Tender at left upper quadrant  Musculoskeletal: Normal range of motion. He exhibits no edema or tenderness.  Neurological: He is alert. Coordination normal.  Skin: Skin is warm and dry. No rash noted.  Psychiatric: He has a normal mood and affect.  Nursing note and vitals reviewed.   ED Course  Procedures  (including critical care time) Labs Review Labs Reviewed  LIPASE, BLOOD - Abnormal; Notable for the following:    Lipase 55 (*)    All other components within normal limits  COMPREHENSIVE METABOLIC PANEL - Abnormal; Notable for the following:    CO2 21 (*)    AST 101 (*)    ALT 73 (*)    Alkaline Phosphatase 131 (*)    All other components within normal limits  CBC  URINALYSIS, ROUTINE W REFLEX MICROSCOPIC (NOT AT [facility])    Imaging Review No results found.   EKG Interpretation None     8:30 PM pain not improved after treatment with GI cocktail. Nausea is controlled after treatment with oral Zofran. He'll be given oxycodone 50 g orally prior to discharge. Case manager was called who advised him to go to [facility] tomorrow for medications Results for orders placed or performed during the hospital encounter of 12/15/14  Lipase, blood  Result Value Ref Range   Lipase 55 (H) 22 - 51 U/L  Comprehensive metabolic panel  Result Value Ref Range   Sodium 136 135 - 145 mmol/L   Potassium 4.4 3.5 - 5.1 mmol/L   Chloride 101 101 - 111 mmol/L   CO2 21 (L) 22 - 32 mmol/L   Glucose, Bld 97 65 - 99 mg/dL   BUN 9 6 - 20 mg/dL   Creatinine, Ser 0.77 0.61 - 1.24 mg/dL   Calcium 9.1 8.9 - 10.3 mg/dL   Total Protein 8.0 6.5 - 8.1 g/dL   Albumin 4.2 3.5 - 5.0 g/dL   AST 101 (H) 15 - 41 U/L   ALT 73 (H) 17 - 63 U/L   Alkaline Phosphatase 131 (H) 38 - 126 U/L   Total Bilirubin 1.2 0.3 - 1.2 mg/dL   GFR calc non Af Amer >60 >60 mL/min   GFR calc Af Amer >60 >60 mL/min   Anion gap 14 5 - 15  CBC  Result Value Ref Range   WBC 5.7 4.0 - 10.5 K/uL   RBC 4.36 4.22 - 5.81 MIL/uL   Hemoglobin 14.2 13.0 - 17.0 g/dL   HCT 41.0 39.0 - 52.0 %   MCV 94.0 78.0 - 100.0 fL   MCH 32.6 26.0 - 34.0 pg   MCHC 34.6 30.0 - 36.0 g/dL   RDW 13.5 11.5 - 15.5 %   Platelets 224 150 - 400 K/uL  Urinalysis, Routine w reflex microscopic (not at [facility])  Result Value Ref Range    Color, Urine YELLOW YELLOW   APPearance CLEAR CLEAR   Specific Gravity, Urine 1.011 1.005 - 1.030   pH 5.0 5.0 - 8.0   Glucose, UA NEGATIVE NEGATIVE mg/dL   Hgb urine dipstick NEGATIVE NEGATIVE   Bilirubin Urine NEGATIVE NEGATIVE   Ketones, ur NEGATIVE NEGATIVE mg/dL   Protein, ur NEGATIVE NEGATIVE mg/dL   Urobilinogen, UA 0.2 0.0 - 1.0 mg/dL   Nitrite NEGATIVE NEGATIVE   Leukocytes, UA NEGATIVE NEGATIVE   Ct Angio Chest Aorta W/cm &/or Wo/cm  11/26/2014   CLINICAL DATA:  Acute onset of dull achy pain at the central chest and underneath the left breast. Current history of Wolff-Parkinson-White syndrome. Initial encounter.  EXAM: CT ANGIOGRAPHY CHEST WITH CONTRAST  TECHNIQUE: Multidetector CT imaging of the chest was performed using the standard protocol during bolus administration of intravenous contrast. Multiplanar CT image reconstructions and MIPs were obtained to evaluate the vascular anatomy.  CONTRAST:  13mL OMNIPAQUE IOHEXOL 350 MG/ML SOLN  COMPARISON:  Chest radiograph from 05/27/2014  FINDINGS: There is no evidence of aortic dissection. There is no evidence of aneurysmal dilatation. No calcific atherosclerotic disease is seen. The great vessels are grossly unremarkable in appearance.  There is no evidence of pulmonary embolus.  Scattered blebs are noted at the right lung apex, with minimal underlying emphysema seen. The lungs are otherwise grossly clear. There is no evidence of significant focal consolidation, pleural effusion or pneumothorax. No masses are identified; no abnormal focal contrast enhancement is seen.  The mediastinum is unremarkable in appearance. No mediastinal lymphadenopathy is seen. No pericardial effusion is identified. No axillary lymphadenopathy is seen. The visualized portions of the thyroid gland are unremarkable in appearance.  The visualized portions of the liver and spleen are unremarkable. Two cystic lesions are noted at the pancreatic body, measuring 2.3  cm and  1.3 cm in size. Would correlate with pancreatic lab values and perform MRCP for further evaluation. The visualized portions of the gallbladder, adrenal glands and kidneys are unremarkable in appearance.  No acute osseous abnormalities are seen.  Review of the MIP images confirms the above findings.  IMPRESSION: 1. No evidence of aortic dissection. No evidence of aneurysmal dilatation. No calcific atherosclerotic disease seen. 2. No evidence of pulmonary embolus. 3. Scattered blebs at the right lung apex, with minimal underlying emphysema noted. 4. Two cystic lesions seen at the pancreatic body, measuring 2.3 cm and 1.3 cm in size. Malignancy cannot be entirely excluded. Would correlate with pancreatic lab values and recommend MRCP for further evaluation.   Electronically Signed   By: Garald Balding M.D.   On: 11/26/2014 23:09     MDM  Spoke with patient at length that alcohol causes flareups of pain.  Plan clear liquid diet for 24 hours. Prescription Zofran Follow-up [place] [facility] tomorrow  Diagnosis chronic pancreatitis  Final diagnoses:  None        Orlie Dakin, MD 12/15/14 2036

## 2014-12-15 NOTE — ED Notes (Signed)
Ice chips given with md authorization

## 2014-12-15 NOTE — ED Notes (Signed)
Pt placed in a gown and hooked up to the monitor with the BP cuff and pulse ox 

## 2014-12-16 ENCOUNTER — Inpatient Hospital Stay (HOSPITAL_COMMUNITY)
Admission: EM | Admit: 2014-12-16 | Discharge: 2014-12-18 | DRG: 439 | Disposition: A | Discharge status: Home / Self Care | Payer: Self-pay | Attending: Internal Medicine | Admitting: Internal Medicine

## 2014-12-16 ENCOUNTER — Encounter (HOSPITAL_COMMUNITY): Payer: Self-pay | Admitting: General Practice

## 2014-12-16 DIAGNOSIS — I456 Pre-excitation syndrome: Secondary | ICD-10-CM | POA: Diagnosis present

## 2014-12-16 DIAGNOSIS — F1020 Alcohol dependence, uncomplicated: Secondary | ICD-10-CM | POA: Diagnosis present

## 2014-12-16 DIAGNOSIS — J45909 Unspecified asthma, uncomplicated: Secondary | ICD-10-CM | POA: Diagnosis present

## 2014-12-16 DIAGNOSIS — K860 Alcohol-induced chronic pancreatitis: Secondary | ICD-10-CM | POA: Diagnosis present

## 2014-12-16 DIAGNOSIS — G43909 Migraine, unspecified, not intractable, without status migrainosus: Secondary | ICD-10-CM | POA: Diagnosis present

## 2014-12-16 DIAGNOSIS — K859 Acute pancreatitis without necrosis or infection, unspecified: Secondary | ICD-10-CM | POA: Diagnosis present

## 2014-12-16 DIAGNOSIS — F1410 Cocaine abuse, uncomplicated: Secondary | ICD-10-CM | POA: Diagnosis present

## 2014-12-16 DIAGNOSIS — K219 Gastro-esophageal reflux disease without esophagitis: Secondary | ICD-10-CM | POA: Diagnosis present

## 2014-12-16 DIAGNOSIS — I10 Essential (primary) hypertension: Secondary | ICD-10-CM | POA: Diagnosis present

## 2014-12-16 DIAGNOSIS — D649 Anemia, unspecified: Secondary | ICD-10-CM | POA: Diagnosis present

## 2014-12-16 DIAGNOSIS — F1010 Alcohol abuse, uncomplicated: Secondary | ICD-10-CM | POA: Diagnosis present

## 2014-12-16 DIAGNOSIS — F17200 Nicotine dependence, unspecified, uncomplicated: Secondary | ICD-10-CM | POA: Diagnosis present

## 2014-12-16 DIAGNOSIS — F17210 Nicotine dependence, cigarettes, uncomplicated: Secondary | ICD-10-CM | POA: Diagnosis present

## 2014-12-16 DIAGNOSIS — M1990 Unspecified osteoarthritis, unspecified site: Secondary | ICD-10-CM | POA: Diagnosis present

## 2014-12-16 DIAGNOSIS — K862 Cyst of pancreas: Secondary | ICD-10-CM | POA: Diagnosis present

## 2014-12-16 DIAGNOSIS — E780 Pure hypercholesterolemia: Secondary | ICD-10-CM | POA: Diagnosis present

## 2014-12-16 DIAGNOSIS — K852 Alcohol induced acute pancreatitis without necrosis or infection: Secondary | ICD-10-CM | POA: Diagnosis present

## 2014-12-16 DIAGNOSIS — B179 Acute viral hepatitis, unspecified: Secondary | ICD-10-CM | POA: Diagnosis present

## 2014-12-16 DIAGNOSIS — F329 Major depressive disorder, single episode, unspecified: Secondary | ICD-10-CM | POA: Diagnosis present

## 2014-12-16 DIAGNOSIS — E785 Hyperlipidemia, unspecified: Secondary | ICD-10-CM | POA: Diagnosis present

## 2014-12-16 DIAGNOSIS — F419 Anxiety disorder, unspecified: Secondary | ICD-10-CM | POA: Diagnosis present

## 2014-12-16 DIAGNOSIS — R112 Nausea with vomiting, unspecified: Secondary | ICD-10-CM

## 2014-12-16 DIAGNOSIS — Z720 Tobacco use: Secondary | ICD-10-CM | POA: Diagnosis present

## 2014-12-16 LAB — COMPREHENSIVE METABOLIC PANEL
ALK PHOS: 135 U/L — AB (ref 38–126)
ALT: 65 U/L — ABNORMAL HIGH (ref 17–63)
ANION GAP: 13 (ref 5–15)
AST: 65 U/L — ABNORMAL HIGH (ref 15–41)
Albumin: 4.5 g/dL (ref 3.5–5.0)
BUN: <5 mg/dL — ABNORMAL LOW (ref 6–20)
CALCIUM: 9.4 mg/dL (ref 8.9–10.3)
CHLORIDE: 99 mmol/L — AB (ref 101–111)
CO2: 23 mmol/L (ref 22–32)
Creatinine, Ser: 0.75 mg/dL (ref 0.61–1.24)
GFR calc Af Amer: >60 mL/min (ref >60)
Glucose, Bld: 116 mg/dL — ABNORMAL HIGH (ref 65–99)
Potassium: 3.5 mmol/L (ref 3.5–5.1)
Sodium: 135 mmol/L (ref 135–145)
Total Bilirubin: 2 mg/dL — ABNORMAL HIGH (ref 0.3–1.2)
Total Protein: 8.3 g/dL — ABNORMAL HIGH (ref 6.5–8.1)

## 2014-12-16 LAB — LIPASE, BLOOD: Lipase: 151 U/L — ABNORMAL HIGH (ref 22–51)

## 2014-12-16 LAB — CBC
HEMATOCRIT: 41.8 % (ref 39.0–52.0)
HEMOGLOBIN: 14.5 g/dL (ref 13.0–17.0)
MCH: 32.4 pg (ref 26.0–34.0)
MCHC: 34.7 g/dL (ref 30.0–36.0)
MCV: 93.5 fL (ref 78.0–100.0)
Platelets: 196 10*3/uL (ref 150–400)
RBC: 4.47 MIL/uL (ref 4.22–5.81)
RDW: 13.4 % (ref 11.5–15.5)
WBC: 11.9 10*3/uL — ABNORMAL HIGH (ref 4.0–10.5)

## 2014-12-16 MED ORDER — METOPROLOL TARTRATE 25 MG PO TABS
25.0000 mg | ORAL_TABLET | Freq: Two times a day (BID) | ORAL | Status: DC
  Administered 2014-12-16 – 2014-12-18 (×4): 25 mg via ORAL
  Filled 2014-12-16 (×5): qty 1

## 2014-12-16 MED ORDER — PANTOPRAZOLE SODIUM 40 MG PO TBEC
40.0000 mg | DELAYED_RELEASE_TABLET | Freq: Every day | ORAL | Status: DC
  Administered 2014-12-16 – 2014-12-18 (×3): 40 mg via ORAL
  Filled 2014-12-16 (×3): qty 1

## 2014-12-16 MED ORDER — MORPHINE SULFATE 2 MG/ML IJ SOLN
1.0000 mg | INTRAMUSCULAR | Status: DC | PRN
  Administered 2014-12-16 – 2014-12-17 (×7): 2 mg via INTRAVENOUS
  Filled 2014-12-16 (×7): qty 1

## 2014-12-16 MED ORDER — FOLIC ACID 1 MG PO TABS
1.0000 mg | ORAL_TABLET | Freq: Every day | ORAL | Status: DC
Start: 2014-12-16 — End: 2014-12-18
  Administered 2014-12-16 – 2014-12-18 (×3): 1 mg via ORAL
  Filled 2014-12-16 (×3): qty 1

## 2014-12-16 MED ORDER — LORATADINE 10 MG PO TABS: 10.0000 mg | ORAL_TABLET | Freq: Every day | ORAL | Status: DC | PRN

## 2014-12-16 MED ORDER — ACETAMINOPHEN 650 MG RE SUPP: 650.0000 mg | Freq: Four times a day (QID) | RECTAL | Status: DC | PRN

## 2014-12-16 MED ORDER — GI COCKTAIL ~~LOC~~
30.0000 mL | Freq: Once | ORAL | Status: AC
  Administered 2014-12-16: 30 mL via ORAL
  Filled 2014-12-16: qty 30

## 2014-12-16 MED ORDER — ENOXAPARIN SODIUM 40 MG/0.4ML ~~LOC~~ SOLN
40.0000 mg | SUBCUTANEOUS | Status: DC
Start: 2014-12-16 — End: 2014-12-18
  Administered 2014-12-16 – 2014-12-18 (×3): 40 mg via SUBCUTANEOUS
  Filled 2014-12-16 (×3): qty 0.4

## 2014-12-16 MED ORDER — PROMETHAZINE HCL 25 MG/ML IJ SOLN
25.0000 mg | Freq: Once | INTRAMUSCULAR | Status: AC
  Administered 2014-12-16: 25 mg via INTRAVENOUS

## 2014-12-16 MED ORDER — PROMETHAZINE HCL 25 MG PO TABS
25.0000 mg | ORAL_TABLET | Freq: Once | ORAL | Status: DC
  Filled 2014-12-16: qty 1

## 2014-12-16 MED ORDER — PROMETHAZINE HCL 25 MG PO TABS: 12.5000 mg | ORAL_TABLET | Freq: Four times a day (QID) | ORAL | Status: DC | PRN

## 2014-12-16 MED ORDER — OXYCODONE HCL 5 MG PO TABS
5.0000 mg | ORAL_TABLET | ORAL | Status: DC | PRN
  Administered 2014-12-17: 5 mg via ORAL
  Filled 2014-12-16: qty 1

## 2014-12-16 MED ORDER — KCL IN DEXTROSE-NACL 20-5-0.9 MEQ/L-%-% IV SOLN
INTRAVENOUS | Status: DC
  Administered 2014-12-16 – 2014-12-18 (×4): via INTRAVENOUS
  Filled 2014-12-16 (×6): qty 1000

## 2014-12-16 MED ORDER — VITAMIN B-1 100 MG PO TABS
100.0000 mg | ORAL_TABLET | Freq: Every day | ORAL | Status: DC
  Administered 2014-12-16 – 2014-12-18 (×3): 100 mg via ORAL
  Filled 2014-12-16 (×3): qty 1

## 2014-12-16 MED ORDER — ACETAMINOPHEN 325 MG PO TABS: 650.0000 mg | ORAL_TABLET | Freq: Four times a day (QID) | ORAL | Status: DC | PRN

## 2014-12-16 MED ORDER — SERTRALINE HCL 50 MG PO TABS
75.0000 mg | ORAL_TABLET | Freq: Every day | ORAL | Status: DC
  Administered 2014-12-16 – 2014-12-18 (×3): 75 mg via ORAL
  Filled 2014-12-16 (×6): qty 1

## 2014-12-16 MED ORDER — QUETIAPINE FUMARATE 25 MG PO TABS
100.0000 mg | ORAL_TABLET | Freq: Every day | ORAL | Status: DC
  Administered 2014-12-16 – 2014-12-17 (×2): 100 mg via ORAL
  Filled 2014-12-16 (×2): qty 4

## 2014-12-16 MED ORDER — HYDROMORPHONE HCL 1 MG/ML IJ SOLN
1.0000 mg | Freq: Once | INTRAMUSCULAR | Status: AC
  Administered 2014-12-16: 1 mg via INTRAVENOUS
  Filled 2014-12-16: qty 1

## 2014-12-16 MED ORDER — ALBUTEROL SULFATE (2.5 MG/3ML) 0.083% IN NEBU: 3.0000 mL | INHALATION_SOLUTION | Freq: Four times a day (QID) | RESPIRATORY_TRACT | Status: DC | PRN

## 2014-12-16 MED ORDER — DIPHENHYDRAMINE HCL 25 MG PO TABS
25.0000 mg | ORAL_TABLET | Freq: Four times a day (QID) | ORAL | Status: DC | PRN
  Filled 2014-12-16: qty 1

## 2014-12-16 MED ORDER — SODIUM CHLORIDE 0.9 % IV BOLUS (SEPSIS)
1000.0000 mL | Freq: Once | INTRAVENOUS | Status: AC
  Administered 2014-12-16: 1000 mL via INTRAVENOUS

## 2014-12-16 NOTE — Progress Notes (Signed)
Pt found at bedside drinking soda brought from home. Soda was taken away from pt. Pt was already aware of NPO status but said he just felt very thirsty. Also, pt found to be taking home medication (metropolol). Pt stated he did not take it yesterday and wanted to take it now. Pt made aware that medications from home would be taken from his bag to pharmacy. Medications given to [employer], counted, and taken to pharmacy. Will continue to monitor.

## 2014-12-16 NOTE — Progress Notes (Signed)
Pt c/o of indigestion, requested GI cocktail. Paged oncall NP. Schorr, NP gave telephone orders with readback to give GI cocktail, 57mL one time dose. Will place orders and continue to monitor.

## 2014-12-16 NOTE — ED Notes (Signed)
Per EMS pt was dx with Pancreatitis x 3 years ago; Pt states he started having pain x 1 day; Pt was seen here and released tonight at 8pm; pt states he came back because he needs pain meds and is in to much pain; Pt denies ETOH and fatty foods; pt states he has vomited 7 times today.

## 2014-12-16 NOTE — ED Provider Notes (Signed)
CSN: [id number]     Arrival date & time 12/16/14  0443 History   First MD Initiated Contact with Patient 12/16/14 [phone]     Chief Complaint  Patient presents with  . Pancreatitis     (Consider location/radiation/quality/duration/timing/severity/associated sxs/prior Treatment) HPI Comments: History of pancreatitis. Last alcohol use 3 days ago. 2 other ED visits in the past 2 days. Left less than 24 hours ago and had vomiting after eating and persistent sharp epigastric pain that is similar to prior.  Patient is a 44 y.o. male presenting with abdominal pain. The history is provided by the patient.  Abdominal Pain Pain location:  Epigastric Pain quality: sharp   Pain radiates to:  Back Pain severity:  Moderate Onset quality:  Gradual Duration:  3 days Timing:  Constant Progression:  Waxing and waning Chronicity:  Chronic Context: recent illness   Relieved by:  Nothing Worsened by:  Nothing tried Associated symptoms: vomiting   Associated symptoms: no cough, no fever and no shortness of breath     Past Medical History  Diagnosis Date  . Hypertension   . Asthma   . Pancreatitis   . Cocaine abuse   . Depression   . H/O suicide attempt   . Heart murmur     "when he was little" (03/06/2013)  . Shortness of breath     "can happen at anytime" (03/06/2013)  . Anemia   . H/O hiatal hernia   . GERD (gastroesophageal reflux disease)   . Anxiety   . WPW (Wolff-Parkinson-White syndrome)     [name] 03/06/2013  . High cholesterol   . Migraine     "monthly" (01/30/2014)  . Arthritis     "knees; arms; elbows" (01/30/2014)  . Femoral condyle fracture 03/08/2014    left medial/notes 03/09/2014  . Alcoholism /alcohol abuse    Past Surgical History  Procedure Laterality Date  . Facial fracture surgery Left 1990's    "result of trauma"   . Eye surgery Left 1990's    "result of trauma"   . Left heart catheterization with coronary angiogram Right 03/07/2013    Procedure: LEFT HEART  CATHETERIZATION WITH CORONARY ANGIOGRAM;  Surgeon: [name], MD;  Location: [facility];  Service: Cardiovascular;  Laterality: Right;   Family History  Problem Relation Age of Onset  . Hypertension Other   . Coronary artery disease Other    History  Substance Use Topics  . Smoking status: Current Every Day Smoker -- 0.50 packs/day for 30 years    Types: Cigarettes  . Smokeless tobacco: Current User    Types: Chew  . Alcohol Use: 3.6 oz/week    6 Cans of beer per week     Comment: Last drink Thursday 12/11/14 per patient    Review of Systems  Constitutional: Negative for fever.  Respiratory: Negative for cough and shortness of breath.   Gastrointestinal: Positive for vomiting and abdominal pain.  All other systems reviewed and are negative.     Allergies  Shellfish-derived products and Trazodone and nefazodone  Home Medications   Prior to Admission medications   Medication Sig Start Date End Date Taking? Authorizing Provider  albuterol (PROVENTIL HFA;VENTOLIN HFA) 108 (90 BASE) MCG/ACT inhaler Inhale 2 puffs into the lungs every 6 (six) hours as needed for wheezing or shortness of breath (wheezing).     Historical Provider, MD  diphenhydrAMINE (BENADRYL) 25 MG tablet Take 25 mg by mouth every 6 (six) hours as needed for allergies.    Historical  Provider, MD  folic acid (FOLVITE) 1 MG tablet Take 1 tablet (1 mg total) by mouth daily. 03/21/14   [name], MD  lipase/protease/amylase (CREON) 12000 UNITS CPEP capsule Take 2 capsules (24,000 Units total) by mouth 3 (three) times daily with meals. 02/06/14   [name], MD  loperamide (IMODIUM) 2 MG capsule Take 1-2 capsules (2-4 mg total) by mouth as needed for diarrhea or loose stools. 01/23/14   [name], PA-C  loratadine (CLARITIN) 10 MG tablet Take 1 tablet (10 mg total) by mouth daily. Patient taking differently: Take 10 mg by mouth daily as needed for allergies.  02/25/14   [name], PA-C   metoprolol tartrate (LOPRESSOR) 25 MG tablet Take 1 tablet (25 mg total) by mouth 2 (two) times daily. 02/06/14   [name], MD  Multiple Vitamin (MULTIVITAMIN) capsule Take 1 capsule by mouth daily. 02/06/14   [name], MD  omeprazole (PRILOSEC) 20 MG capsule Take 1 capsule (20 mg total) by mouth daily. 08/26/14   [name], PA-C  ondansetron (ZOFRAN ODT) 4 MG disintegrating tablet Take 1 tablet (4 mg total) by mouth every 8 (eight) hours as needed for nausea or vomiting. 08/26/14   [name], PA-C  ondansetron (ZOFRAN) 8 MG tablet Take 1 tablet (8 mg total) by mouth every 8 (eight) hours as needed for nausea. 12/15/14   Orlie Dakin, MD  pantoprazole (PROTONIX) 20 MG tablet Take 1 tablet (20 mg total) by mouth daily. 11/26/14   Charlesetta Shanks, MD  promethazine (PHENERGAN) 25 MG tablet Take 1 tablet (25 mg total) by mouth every 6 (six) hours as needed for nausea. 12/13/14   Domenic Moras, PA-C  QUEtiapine (SEROQUEL) 100 MG tablet Take 1 tablet (100 mg total) by mouth at bedtime. For mood control 01/15/14   Jonetta Osgood, MD  sertraline (ZOLOFT) 25 MG tablet Take 75 mg by mouth daily.    Historical Provider, MD  sucralfate (CARAFATE) 1 G tablet Take 1 tablet (1 g total) by mouth 4 (four) times daily -  with meals and at bedtime. 08/19/14   [name], MD   BP 164/101 mmHg  Pulse 105  Resp 17  SpO2 100% Physical Exam  Constitutional: He is oriented to person, place, and time. He appears well-developed and well-nourished. No distress.  HENT:  Head: Normocephalic and atraumatic.  Mouth/Throat: No oropharyngeal exudate.  Eyes: EOM are normal. Pupils are equal, round, and reactive to light.  Neck: Normal range of motion. Neck supple.  Cardiovascular: Normal rate and regular rhythm.  Exam reveals no friction rub.   No murmur heard. Pulmonary/Chest: Effort normal and breath sounds normal. No respiratory distress. He has no wheezes. He has no rales.  Abdominal: Soft. He  exhibits no distension. There is no tenderness. There is no rebound.  Musculoskeletal: Normal range of motion. He exhibits no edema.  Neurological: He is alert and oriented to person, place, and time.  Skin: No rash noted. He is not diaphoretic.  Nursing note and vitals reviewed.   ED Course  Procedures (including critical care time) Labs Review Labs Reviewed  CBC  LIPASE, BLOOD  COMPREHENSIVE METABOLIC PANEL    Imaging Review No results found.   EKG Interpretation None      MDM   Final diagnoses:  Acute pancreatitis, unspecified pancreatitis type  Non-intractable vomiting with nausea, vomiting of unspecified type    30M with hx of pancreatitis presents with epigastric pain, nausea, vomiting. Seen earlier today,  went home and had one episode of vomiting after eating. Denies any alcohol use.  Here with epigastric pain, will hydrate and give pain meds. Labs worsening, admitted since he's been back 3 times in 3 days.    Evelina Bucy, MD 12/16/14 [phone]

## 2014-12-16 NOTE — Progress Notes (Signed)
ONIX JUMPER [id number] Code Status: Full code  Admission Data: 12/16/2014 7:53 AM Attending Provider:  Niu MNO:TRRNHA, Gabrielle Dare, MD Consults/ Treatment Team:    BOB DAVERSA is a 44 y.o. male patient admitted from ED awake, alert - oriented  X 3 - no acute distress noted.  VSS - Blood pressure 159/104, pulse 90, temperature 98 F (36.7 C), temperature source Oral, resp. rate 18, SpO2 100 %.    IV in place, occlusive dsg intact without redness.  Orientation to room, and floor completed with information packet given to patient/family.  Patient declined safety video at this time.  Admission INP armband ID verified with patient/family, and in place.   SR up x 2, fall assessment complete, with patient and family able to verbalize understanding of risk associated with falls, and verbalized understanding to call nsg before up out of bed.  Call light within reach, patient able to voice, and demonstrate understanding.  Skin, clean-dry- intact without evidence of bruising, or skin tears.   No evidence of skin break down noted on exam.     Will cont to eval and treat per MD orders.  Vertell Limber, RN 12/16/2014 7:53 AM

## 2014-12-16 NOTE — Progress Notes (Signed)
[facility] Admissions paged about pt's arrival to unit, no orders currently in place. Awaiting MD.

## 2014-12-16 NOTE — Progress Notes (Signed)
Pt continues to complain of pain. [facility] admissions paged once again for MD/admission orders.

## 2014-12-16 NOTE — H&P (Signed)
History and Physical  [name] DJS:[id number] DOB: 12-May-1970 DOA: 12/16/2014  Referring physician: Dr. Evelina Bucy PCP: Angelica Chessman, MD   Chief Complaint: nausea, vomiting, and abdominal pain  HPI: 44 year old man with Hx of hypertension, hyperlipidemia, asthma, GERD, depression, anxiety, cocaine abuse, alcohol abuse, and alcoholic pancreatitis who presented with nausea, vomiting, and abdominal pain for 2 days. Lipase is elevated at 150, consistent with pancreatitis.  The patient admits to drinking alcohol immediately prior to the onset of his symptoms.  His symptoms then worsened when he attempted to eat "leftover barbecue from the refrigerator."  He was evaluated in the emergency room where acute hepatitis was confirmed and admitted to the acute units for volume support and nothing by mouth status.  Assessment/Plan  Acute pancreatitis due to alcohol Again the patient has been counseled as to the direct length between his alcohol abuse and his acute pancreatitis - keep nothing by mouth except for medications and ice chips - analgesia as required - slowly advance diet as symptoms improve but will not provide both IV pain medication and a regular diet  Alcohol abuse The length between the patient's acute illness and his ongoing alcohol use has been explained to him in great detail - he has been advised to refrain from all alcohol use  Mild transaminitis Likely due to alcohol use - follow trend  Hypertension Poorly controlled at present likely due to noncompliance with medications plus pain - resume home medical regimen and follow  Polysubstance abuse - cocaine, alcohol Clinical social work counseling requested  History of depression No evidence of acute exacerbation the present time  WPW  Code Status: FULL DVT Prophylaxis: lovenox Family Communication: no family present at time of admission  Disposition Plan: Admit to medical bed - anticipate 3-4 days of inpatient  hospitalization  Past Medical History  Diagnosis Date  . Hypertension   . Asthma   . Pancreatitis   . Cocaine abuse   . Depression   . H/O suicide attempt   . Heart murmur     "when he was little" (03/06/2013)  . Shortness of breath     "can happen at anytime" (03/06/2013)  . Anemia   . H/O hiatal hernia   . GERD (gastroesophageal reflux disease)   . Anxiety   . WPW (Wolff-Parkinson-White syndrome)     [name] 03/06/2013  . High cholesterol   . Migraine     "monthly" (01/30/2014)  . Arthritis     "knees; arms; elbows" (01/30/2014)  . Femoral condyle fracture 03/08/2014    left medial/notes 03/09/2014  . Alcoholism /alcohol abuse    Past Surgical History  Procedure Laterality Date  . Facial fracture surgery Left 1990's    "result of trauma"   . Eye surgery Left 1990's    "result of trauma"   . Left heart catheterization with coronary angiogram Right 03/07/2013    Procedure: LEFT HEART CATHETERIZATION WITH CORONARY ANGIOGRAM;  Surgeon: [name], MD;  Location: [facility];  Service: Cardiovascular;  Laterality: Right;    Allergies  Allergen Reactions  . Shellfish-Derived Products Nausea And Vomiting  . Trazodone And Nefazodone Other (See Comments)    Muscle spasms    Social Hx:  reports that he has been smoking Cigarettes.  He has a 15 pack-year smoking history. His smokeless tobacco use includes Chew. He reports that he drinks about 3.6 oz of alcohol per week. He reports that he uses illicit drugs (Marijuana).  The pt lives in [place].  He is followed at the [facility].    Family Hx:   Family History  Problem Relation Age of Onset  . Hypertension Other   . Coronary artery disease Other   No additional pertinent family hx.    Review of Systems:  Constitutional:  No weight loss, night sweats, Fevers, chills, fatigue.  HEENT:  No headaches, Difficulty swallowing,Tooth/dental problems,Sore throat,  No sneezing, itching, ear ache, nasal congestion, post  nasal drip,  Cardio-vascular:  No chest pain, Orthopnea, PND, swelling in lower extremities, anasarca, dizziness, palpitations  GI:  + as stated above  Resp:  No shortness of breath with exertion or at rest. No excess mucus, no productive cough, No non-productive cough, No coughing up of blood.No change in color of mucus.No wheezing.No chest wall deformity  Skin:  no rash or lesions.  GU:  no dysuria, change in color of urine, no urgency or frequency. No flank pain.  Musculoskeletal:  No joint pain or swelling. No decreased range of motion. No back pain.  Psych:  No change in mood or affect. No depression or anxiety. No memory loss.   Prior to Admission medications   Medication Sig Start Date End Date Taking? Authorizing Provider  albuterol (PROVENTIL HFA;VENTOLIN HFA) 108 (90 BASE) MCG/ACT inhaler Inhale 2 puffs into the lungs every 6 (six) hours as needed for wheezing or shortness of breath (wheezing).     Historical Provider, MD  diphenhydrAMINE (BENADRYL) 25 MG tablet Take 25 mg by mouth every 6 (six) hours as needed for allergies.    Historical Provider, MD  folic acid (FOLVITE) 1 MG tablet Take 1 tablet (1 mg total) by mouth daily. 03/21/14   [name], MD  lipase/protease/amylase (CREON) 12000 UNITS CPEP capsule Take 2 capsules (24,000 Units total) by mouth 3 (three) times daily with meals. 02/06/14   [name], MD  loperamide (IMODIUM) 2 MG capsule Take 1-2 capsules (2-4 mg total) by mouth as needed for diarrhea or loose stools. 01/23/14   [name], PA-C  loratadine (CLARITIN) 10 MG tablet Take 1 tablet (10 mg total) by mouth daily. Patient taking differently: Take 10 mg by mouth daily as needed for allergies.  02/25/14   [name], PA-C  metoprolol tartrate (LOPRESSOR) 25 MG tablet Take 1 tablet (25 mg total) by mouth 2 (two) times daily. 02/06/14   [name], MD  Multiple Vitamin (MULTIVITAMIN) capsule Take 1 capsule by mouth daily. 02/06/14    [name], MD  omeprazole (PRILOSEC) 20 MG capsule Take 1 capsule (20 mg total) by mouth daily. 08/26/14   [name], PA-C  ondansetron (ZOFRAN ODT) 4 MG disintegrating tablet Take 1 tablet (4 mg total) by mouth every 8 (eight) hours as needed for nausea or vomiting. 08/26/14   [name], PA-C  ondansetron (ZOFRAN) 8 MG tablet Take 1 tablet (8 mg total) by mouth every 8 (eight) hours as needed for nausea. 12/15/14   Orlie Dakin, MD  pantoprazole (PROTONIX) 20 MG tablet Take 1 tablet (20 mg total) by mouth daily. 11/26/14   Charlesetta Shanks, MD  promethazine (PHENERGAN) 25 MG tablet Take 1 tablet (25 mg total) by mouth every 6 (six) hours as needed for nausea. 12/13/14   Domenic Moras, PA-C  QUEtiapine (SEROQUEL) 100 MG tablet Take 1 tablet (100 mg total) by mouth at bedtime. For mood control 01/15/14   Jonetta Osgood, MD  sertraline (ZOLOFT) 25 MG tablet Take 75 mg by mouth daily.  Historical Provider, MD  sucralfate (CARAFATE) 1 G tablet Take 1 tablet (1 g total) by mouth 4 (four) times daily -  with meals and at bedtime. 08/19/14   [name], MD    Physical Exam: Filed Vitals:   12/16/14 0500 12/16/14 0515 12/16/14 0630 12/16/14 0700  BP: 152/101 146/97 141/99 159/104  Pulse: 96 95 92 90  Temp:    98 F (36.7 C)  TempSrc:    Oral  Resp: 14 11 12 18   SpO2: 100% 100% 98% 100%    Wt Readings from Last 3 Encounters:  11/26/14 67.132 kg (148 lb)  08/19/14 67.132 kg (148 lb)  05/27/14 63.504 kg (140 lb)    General: No acute respiratory distress - alert and conversant  HEENT: Normocephalic,atraumatic, pupils equal round reactive to light and accommodation, extraocular muscles intact bilaterally, OC/OP clear, sclera nonicteric Neck: No JVD or thyromegaly Lungs: Clear to auscultation bilaterally without wheezes or rhonchi, with good air movement throughout all fields Cardiovascular: Regular rate and rhythm without murmur gallop or rub normal S1 and S2 Abdomen: Modestly  tender to palpation in epigastrium and left upper quadrant, nondistended, thin, no appreciable masses, bowel sounds positive Extremities: No significant cyanosis, clubbing, edema bilateral lower extremities Neurologic: Alert and oriented x4, cranial nerves II through XII intact bilaterally, 5 over 5 strength bilateral upper and lower extremities, no Babinski, intact to sensation of touch throughout  Labs on Admission:  Basic Metabolic Panel:  Recent Labs Lab 12/12/14 2239 12/15/14 1350 12/16/14 0513  NA 140 136 135  K 3.8 4.4 3.5  CL 101 101 99*  CO2 25 21* 23  GLUCOSE 89 97 116*  BUN 6 9 <5*  CREATININE 0.59* 0.77 0.75  CALCIUM 8.9 9.1 9.4   Liver Function Tests:  Recent Labs Lab 12/12/14 2239 12/15/14 1350 12/16/14 0513  AST 130* 101* 65*  ALT 70* 73* 65*  ALKPHOS 126 131* 135*  BILITOT 1.5* 1.2 2.0*  PROT 7.3 8.0 8.3*  ALBUMIN 3.9 4.2 4.5    Recent Labs Lab 12/12/14 2239 12/15/14 1350 12/16/14 0513  LIPASE 24 55* 151*   CBC:  Recent Labs Lab 12/12/14 2239 12/15/14 1350 12/16/14 0513  WBC 7.2 5.7 11.9*  HGB 14.1 14.2 14.5  HCT 39.8 41.0 41.8  MCV 92.6 94.0 93.5  PLT 229 224 196   Radiological Exams on Admission: No indication for radiologic exams this admission  EKG: No indication for EKG this admission  Time spent: 1+ hr  Cherene Altes, MD [facility] For Consults/Admissions - Flow Manager - [phone] Office  [phone] Pager [phone]  On-Call/Text Page:      [email or web address]      password [facility]

## 2014-12-16 NOTE — Progress Notes (Signed)
Initial Nutrition Assessment  DOCUMENTATION CODES:   Not applicable  INTERVENTION:   -RD will follow for diet advancement and supplement diet as appropriate  NUTRITION DIAGNOSIS:   Inadequate oral intake related to inability to eat as evidenced by NPO status.  GOAL:   Patient will meet greater than or equal to 90% of their needs  MONITOR:   Diet advancement, Labs, Weight trends, Skin, I & O's  REASON FOR ASSESSMENT:   Malnutrition Screening Tool    ASSESSMENT:   44 year old man with Hx of hypertension, hyperlipidemia, asthma, GERD, depression, anxiety, cocaine abuse, alcohol abuse, and alcoholic pancreatitis who presented with nausea, vomiting, and abdominal pain for 2 days. Lipase is elevated at 150, consistent with pancreatitis. The patient admits to drinking alcohol immediately prior to the onset of his symptoms. His symptoms then worsened when he attempted to eat "leftover barbecue from the refrigerator." He was evaluated in the emergency room where acute hepatitis was confirmed and admitted to the acute units for volume support and nothing by mouth status.  Pt admitted with acute pancreatitis due to alcoholism.   Pt down for procedure at time of visit. Unable to complete Nutrition-Focused physical exam at this time.   Per RN notes, pt is NPO, however, has been non-compliant with order. Pt was consuming soda from home earlier this AM.   Reviewed wt hx, which revealed UBW of 140#. Noted wt gain trend over the past year.   Pt is at nutritional risk due to hx of substance abuse. Will follow for diet advancement and supplement diet as appropriate.   Labs reviewed: Lipase: 151.   Diet Order:  Diet NPO time specified Except for: Ice Chips  Skin:  Reviewed, no issues  Last BM:  12/15/14  Height:   Ht Readings from Last 1 Encounters:  12/16/14 5\' 9"  (1.753 m)    Weight:   Wt Readings from Last 1 Encounters:  12/16/14 147 lb 14.9 oz (67.1 kg)    Ideal Body  Weight:  72.7 kg  BMI:  Body mass index is 21.84 kg/(m^2).  Estimated Nutritional Needs:   Kcal:  2000-2200  Protein:  100-115 grams  Fluid:  2.0-2.2 L  EDUCATION NEEDS:   No education needs identified at this time  [name] A. Jimmye Norman, RD, LDN, CDE Pager: [phone] After hours Pager: [phone]

## 2014-12-16 NOTE — ED Notes (Signed)
Attempt to call report.  RN to call back.

## 2014-12-16 NOTE — ED Notes (Signed)
[name] triage charted by S.Williams under Taylor,EMT by mistake

## 2014-12-17 ENCOUNTER — Ambulatory Visit: Payer: Self-pay | Admitting: Family Medicine

## 2014-12-17 ENCOUNTER — Inpatient Hospital Stay (HOSPITAL_COMMUNITY): Payer: Self-pay

## 2014-12-17 DIAGNOSIS — K852 Alcohol induced acute pancreatitis: Principal | ICD-10-CM

## 2014-12-17 DIAGNOSIS — K861 Other chronic pancreatitis: Secondary | ICD-10-CM

## 2014-12-17 DIAGNOSIS — Z720 Tobacco use: Secondary | ICD-10-CM

## 2014-12-17 LAB — COMPREHENSIVE METABOLIC PANEL
ALT: 50 U/L (ref 17–63)
ANION GAP: 9 (ref 5–15)
AST: 59 U/L — ABNORMAL HIGH (ref 15–41)
Albumin: 4.1 g/dL (ref 3.5–5.0)
Alkaline Phosphatase: 131 U/L — ABNORMAL HIGH (ref 38–126)
CALCIUM: 9.3 mg/dL (ref 8.9–10.3)
CO2: 25 mmol/L (ref 22–32)
CREATININE: 0.72 mg/dL (ref 0.61–1.24)
Chloride: 103 mmol/L (ref 101–111)
GFR calc non Af Amer: >60 mL/min (ref >60)
GLUCOSE: 95 mg/dL (ref 65–99)
Potassium: 4 mmol/L (ref 3.5–5.1)
Sodium: 137 mmol/L (ref 135–145)
Total Bilirubin: 1.4 mg/dL — ABNORMAL HIGH (ref 0.3–1.2)
Total Protein: 7.7 g/dL (ref 6.5–8.1)

## 2014-12-17 LAB — CBC
HEMATOCRIT: 41.8 % (ref 39.0–52.0)
Hemoglobin: 13.6 g/dL (ref 13.0–17.0)
MCH: 32.1 pg (ref 26.0–34.0)
MCHC: 32.5 g/dL (ref 30.0–36.0)
MCV: 98.6 fL (ref 78.0–100.0)
Platelets: 153 10*3/uL (ref 150–400)
RBC: 4.24 MIL/uL (ref 4.22–5.81)
RDW: 13.7 % (ref 11.5–15.5)
WBC: 11.1 10*3/uL — ABNORMAL HIGH (ref 4.0–10.5)

## 2014-12-17 LAB — LIPASE, BLOOD: LIPASE: 296 U/L — AB (ref 22–51)

## 2014-12-17 LAB — RAPID URINE DRUG SCREEN, HOSP PERFORMED
Amphetamines: NOT DETECTED
BENZODIAZEPINES: NOT DETECTED
Barbiturates: POSITIVE — AB
COCAINE: NOT DETECTED
OPIATES: POSITIVE — AB
Tetrahydrocannabinol: POSITIVE — AB

## 2014-12-17 LAB — PHOSPHORUS: PHOSPHORUS: 3.6 mg/dL (ref 2.5–4.6)

## 2014-12-17 LAB — MAGNESIUM: Magnesium: 1.9 mg/dL (ref 1.7–2.4)

## 2014-12-17 MED ORDER — HYDROCODONE-ACETAMINOPHEN 7.5-325 MG PO TABS
1.0000 | ORAL_TABLET | ORAL | Status: DC | PRN
  Administered 2014-12-17 – 2014-12-18 (×5): 1 via ORAL
  Filled 2014-12-17 (×6): qty 1

## 2014-12-17 MED ORDER — MORPHINE SULFATE 2 MG/ML IJ SOLN
2.0000 mg | INTRAMUSCULAR | Status: DC | PRN
  Administered 2014-12-17 (×2): 2 mg via INTRAVENOUS
  Filled 2014-12-17 (×3): qty 1

## 2014-12-17 MED ORDER — GI COCKTAIL ~~LOC~~
30.0000 mL | Freq: Three times a day (TID) | ORAL | Status: DC
  Administered 2014-12-17 – 2014-12-18 (×4): 30 mL via ORAL
  Filled 2014-12-17 (×4): qty 30

## 2014-12-17 MED ORDER — LORAZEPAM 2 MG/ML IJ SOLN
1.0000 mg | Freq: Once | INTRAMUSCULAR | Status: AC
  Administered 2014-12-17: 1 mg via INTRAVENOUS
  Filled 2014-12-17: qty 1

## 2014-12-17 NOTE — Progress Notes (Signed)
Patient Demographics:    [name], is a 44 y.o. male, DOB - 02/15/1971, EGB:[id number]  Admit date - 12/16/2014   Admitting Physician Cherene Altes, MD  Outpatient Primary MD for the patient is Angelica Chessman, MD  LOS - 1   Chief Complaint  Patient presents with  . Pancreatitis        Subjective:    [name] today has, No headache, No chest pain, +ve  abdominal pain - No Nausea, No new weakness tingling or numbness, No Cough - SOB.    Assessment  & Plan :     1. Acute on chronic alcoholic pancreatitis. Exam unremarkable, patient appeared to be in no distress and ambulating in his room, counseled to quit alcohol, says last drink was 6 days ago. Continue supportive care with bowel rest, IV fluids, in control. Lipase is only minimally elevated.   2. Smoking, Alcohol abuse and polysubstance abuse. Counseled to quit all. No signs of DTs. Last alcoholic drink 6 days ago. Continue folic acid and thiamine.   3. ? pancreatic cyst on CT scan. Check CA 19.9 and MRCP. Will require outpatient GI follow-up.   4. Mild transaminitis due to alcohol abuse. Monitor.   5. Depression. Home medications continued.     Code Status : Full  Family Communication  : none present  Disposition Plan  : Home 1-2 days  Consults  :     Procedures  :    CT scan abdomen pelvic showing questionable pancreatic cyst  MRCP abdomen ordered  DVT Prophylaxis  :  Lovenox    Lab Results  Component Value Date   PLT 153 12/17/2014    Inpatient Medications  Scheduled Meds: . enoxaparin (LOVENOX) injection  40 mg Subcutaneous Q24H  . folic acid  1 mg Oral Daily  . gi cocktail  30 mL Oral TID  . metoprolol tartrate  25 mg Oral BID  . pantoprazole  40 mg Oral Daily  . QUEtiapine  100 mg Oral QHS    . sertraline  75 mg Oral Daily  . thiamine  100 mg Oral Daily   Continuous Infusions: . dextrose 5 % and 0.9 % NaCl with KCl 20 mEq/L 75 mL/hr at 12/16/14 2249   PRN Meds:.acetaminophen **OR** acetaminophen, albuterol, diphenhydrAMINE, HYDROcodone-acetaminophen, morphine injection, promethazine  Antibiotics  :    Anti-infectives    None        Objective:   Filed Vitals:   12/16/14 0700 12/16/14 1407 12/16/14 2143 12/17/14 0551  BP: 159/104 146/104 162/98 155/84  Pulse: 90 79 97 84  Temp: 98 F (36.7 C) 98.9 F (37.2 C) 98.9 F (37.2 C) 97.5 F (36.4 C)  TempSrc: Oral Oral Oral Oral  Resp: 18 18 18 18   Height:  5\' 9"  (1.753 m)    Weight:  67.1 kg (147 lb 14.9 oz)    SpO2: 100% 100% 98% 100%    Wt Readings from Last 3 Encounters:  12/16/14 67.1 kg (147 lb 14.9 oz)  11/26/14 67.132 kg (148 lb)  08/19/14 67.132 kg (148 lb)     Intake/Output Summary (Last 24 hours) at 12/17/14 1024 Last data filed at 12/17/14 0827  Gross per 24 hour  Intake    595 ml  Output    975 ml  Net   -380 ml     Physical Exam  Awake Alert, Oriented X 3, No new F.N deficits, Normal affect [place].AT,PERRAL Supple Neck,No JVD, No cervical lymphadenopathy appriciated.  Symmetrical Chest wall movement, Good air movement bilaterally, CTAB RRR,No Gallops,Rubs or new Murmurs, No Parasternal Heave +ve B.Sounds, Abd Soft, No tenderness, No organomegaly appriciated, No rebound - guarding or rigidity. No Cyanosis, Clubbing or edema, No new Rash or bruise      Data Review:   Micro Results No results found for this or any previous visit (from the past 240 hour(s)).  Radiology Reports Ct Angio Chest Aorta W/cm &/or Wo/cm  11/26/2014   CLINICAL DATA:  Acute onset of dull achy pain at the central chest and underneath the left breast. Current history of Wolff-Parkinson-White syndrome. Initial encounter.  EXAM: CT ANGIOGRAPHY CHEST WITH CONTRAST  TECHNIQUE: Multidetector CT imaging of the chest was  performed using the standard protocol during bolus administration of intravenous contrast. Multiplanar CT image reconstructions and MIPs were obtained to evaluate the vascular anatomy.  CONTRAST:  19mL OMNIPAQUE IOHEXOL 350 MG/ML SOLN  COMPARISON:  Chest radiograph from 05/27/2014  FINDINGS: There is no evidence of aortic dissection. There is no evidence of aneurysmal dilatation. No calcific atherosclerotic disease is seen. The great vessels are grossly unremarkable in appearance.  There is no evidence of pulmonary embolus.  Scattered blebs are noted at the right lung apex, with minimal underlying emphysema seen. The lungs are otherwise grossly clear. There is no evidence of significant focal consolidation, pleural effusion or pneumothorax. No masses are identified; no abnormal focal contrast enhancement is seen.  The mediastinum is unremarkable in appearance. No mediastinal lymphadenopathy is seen. No pericardial effusion is identified. No axillary lymphadenopathy is seen. The visualized portions of the thyroid gland are unremarkable in appearance.  The visualized portions of the liver and spleen are unremarkable. Two cystic lesions are noted at the pancreatic body, measuring 2.3 cm and 1.3 cm in size. Would correlate with pancreatic lab values and perform MRCP for further evaluation. The visualized portions of the gallbladder, adrenal glands and kidneys are unremarkable in appearance.  No acute osseous abnormalities are seen.  Review of the MIP images confirms the above findings.  IMPRESSION: 1. No evidence of aortic dissection. No evidence of aneurysmal dilatation. No calcific atherosclerotic disease seen. 2. No evidence of pulmonary embolus. 3. Scattered blebs at the right lung apex, with minimal underlying emphysema noted. 4. Two cystic lesions seen at the pancreatic body, measuring 2.3 cm and 1.3 cm in size. Malignancy cannot be entirely excluded. Would correlate with pancreatic lab values and recommend MRCP  for further evaluation.   Electronically Signed   By: Garald Balding M.D.   On: 11/26/2014 23:09     CBC  Recent Labs Lab 12/12/14 2239 12/15/14 1350 12/16/14 0513 12/17/14 0912  WBC 7.2 5.7 11.9* 11.1*  HGB 14.1 14.2 14.5 13.6  HCT 39.8 41.0 41.8 41.8  PLT 229 224 196 153  MCV 92.6 94.0 93.5 98.6  MCH 32.8 32.6 32.4 32.1  MCHC 35.4 34.6 34.7 32.5  RDW 14.0 13.5 13.4 13.7    Chemistries   Recent Labs Lab 12/12/14 2239 12/15/14 1350 12/16/14 0513 12/17/14 0912  NA 140 136 135 137  K 3.8 4.4 3.5 4.0  CL 101 101 99* 103  CO2 25 21* 23 25  GLUCOSE 89 97 116* 95  BUN 6 9 <5* <5*  CREATININE 0.59* 0.77 0.75 0.72  CALCIUM 8.9 9.1 9.4 9.3  MG  --   --   --  1.9  AST 130* 101* 65* 59*  ALT 70* 73* 65* 50  ALKPHOS 126 131* 135* 131*  BILITOT 1.5* 1.2 2.0* 1.4*   ------------------------------------------------------------------------------------------------------------------ estimated creatinine clearance is 110.7 mL/min (by C-G formula based on Cr of 0.72). ------------------------------------------------------------------------------------------------------------------ No results for input(s): HGBA1C in the last 72 hours. ------------------------------------------------------------------------------------------------------------------ No results for input(s): CHOL, HDL, LDLCALC, TRIG, CHOLHDL, LDLDIRECT in the last 72 hours. ------------------------------------------------------------------------------------------------------------------ No results for input(s): TSH, T4TOTAL, T3FREE, THYROIDAB in the last 72 hours.  Invalid input(s): FREET3 ------------------------------------------------------------------------------------------------------------------ No results for input(s): VITAMINB12, FOLATE, FERRITIN, TIBC, IRON, RETICCTPCT in the last 72 hours.  Coagulation profile No results for input(s): INR, PROTIME in the last 168 hours.  No results for input(s): DDIMER  in the last 72 hours.  Cardiac Enzymes No results for input(s): CKMB, TROPONINI, MYOGLOBIN in the last 168 hours.  Invalid input(s): CK ------------------------------------------------------------------------------------------------------------------ Invalid input(s): POCBNP   Time Spent in minutes  35   SINGH,PRASHANT K M.D on 12/17/2014 at 10:24 AM  Between 7am to 7pm - Pager - [phone]  After 7pm go to [email or web address] - password [facility]  [facility] -  Office  [phone]

## 2014-12-18 LAB — CBC
HCT: 36.6 % — ABNORMAL LOW (ref 39.0–52.0)
Hemoglobin: 12 g/dL — ABNORMAL LOW (ref 13.0–17.0)
MCH: 32.2 pg (ref 26.0–34.0)
MCHC: 32.8 g/dL (ref 30.0–36.0)
MCV: 98.1 fL (ref 78.0–100.0)
Platelets: 135 10*3/uL — ABNORMAL LOW (ref 150–400)
RBC: 3.73 MIL/uL — AB (ref 4.22–5.81)
RDW: 13.7 % (ref 11.5–15.5)
WBC: 6.4 10*3/uL (ref 4.0–10.5)

## 2014-12-18 LAB — COMPREHENSIVE METABOLIC PANEL
ALBUMIN: 3.4 g/dL — AB (ref 3.5–5.0)
ALT: 40 U/L (ref 17–63)
ANION GAP: 10 (ref 5–15)
AST: 49 U/L — AB (ref 15–41)
Alkaline Phosphatase: 111 U/L (ref 38–126)
BILIRUBIN TOTAL: 0.9 mg/dL (ref 0.3–1.2)
BUN: <5 mg/dL — ABNORMAL LOW (ref 6–20)
CHLORIDE: 104 mmol/L (ref 101–111)
CO2: 23 mmol/L (ref 22–32)
Calcium: 9 mg/dL (ref 8.9–10.3)
Creatinine, Ser: 0.62 mg/dL (ref 0.61–1.24)
GFR calc Af Amer: >60 mL/min (ref >60)
GFR calc non Af Amer: >60 mL/min (ref >60)
Glucose, Bld: 105 mg/dL — ABNORMAL HIGH (ref 65–99)
POTASSIUM: 4.2 mmol/L (ref 3.5–5.1)
Sodium: 137 mmol/L (ref 135–145)
Total Protein: 6.6 g/dL (ref 6.5–8.1)

## 2014-12-18 LAB — LIPASE, BLOOD: Lipase: 299 U/L — ABNORMAL HIGH (ref 22–51)

## 2014-12-18 LAB — CANCER ANTIGEN 19-9: CA 19-9: 77 U/mL — ABNORMAL HIGH (ref 0–35)

## 2014-12-18 MED ORDER — GI COCKTAIL ~~LOC~~: 30.0000 mL | Freq: Three times a day (TID) | ORAL | Status: DC

## 2014-12-18 MED ORDER — HYDROCODONE-ACETAMINOPHEN 7.5-325 MG PO TABS: 1.0000 | ORAL_TABLET | Freq: Four times a day (QID) | ORAL | Status: DC | PRN

## 2014-12-18 NOTE — Discharge Instructions (Signed)
Follow with Primary MD JEGEDE, OLUGBEMIGA, MD in 7 days   Get CBC, CMP, CA19-9,  2 view Chest X ray checked  by Primary MD next visit.    Activity: As tolerated with Full fall precautions use walker/cane & assistance as needed   Disposition Home     Diet: Low Fat soft diet for 1 week then advance gradually to Heart Healthy.  For Heart failure patients - Check your Weight same time everyday, if you gain over 2 pounds, or you develop in leg swelling, experience more shortness of breath or chest pain, call your Primary MD immediately. Follow Cardiac Low Salt Diet and 1.5 lit/day fluid restriction.   On your next visit with your primary care physician please Get Medicines reviewed and adjusted.   Please request your Prim.MD to go over all Hospital Tests and Procedure/Radiological results at the follow up, please get all Hospital records sent to your Prim MD by signing hospital release before you go home.   If you experience worsening of your admission symptoms, develop shortness of breath, life threatening emergency, suicidal or homicidal thoughts you must seek medical attention immediately by calling 911 or calling your MD immediately  if symptoms less severe.  You Must read complete instructions/literature along with all the possible adverse reactions/side effects for all the Medicines you take and that have been prescribed to you. Take any new Medicines after you have completely understood and accpet all the possible adverse reactions/side effects.   Do not drive, operating heavy machinery, perform activities at heights, swimming or participation in water activities or provide baby sitting services if your were admitted for syncope or siezures until you have seen by Primary MD or a Neurologist and advised to do so again.  Do not drive when taking Pain medications.    Do not take more than prescribed Pain, Sleep and Anxiety Medications  Special Instructions: If you have smoked or  chewed Tobacco  in the last 2 yrs please stop smoking, stop any regular Alcohol  and or any Recreational drug use.  Wear Seat belts while driving.   Please note  You were cared for by a hospitalist during your hospital stay. If you have any questions about your discharge medications or the care you received while you were in the hospital after you are discharged, you can call the unit and asked to speak with the hospitalist on call if the hospitalist that took care of you is not available. Once you are discharged, your primary care physician will handle any further medical issues. Please note that NO REFILLS for any discharge medications will be authorized once you are discharged, as it is imperative that you return to your primary care physician (or establish a relationship with a primary care physician if you do not have one) for your aftercare needs so that they can reassess your need for medications and monitor your lab values.

## 2014-12-18 NOTE — Care Management Note (Signed)
Case Management Note  Patient Details  Name: [name] MRN: [id number] Date of Birth: Dec 17, 1970  Subjective/Objective:  Patient is from home, pta indep.  Patient is for dc today, NCM gave RN brochure for [facility] , which patient has an apt on 9/8 at 3 pm.  Since he has an apt he can go to the clinic to get his medications if needed.                   Action/Plan:   Expected Discharge Date:                  Expected Discharge Plan:  Home/Self Care  In-House Referral:     Discharge planning Services  CM Consult, [facility]  Post Acute Care Choice:    Choice offered to:     DME Arranged:    DME Agency:     HH Arranged:    [place] Agency:     Status of Service:  Completed, signed off  Medicare Important Message Given:    Date Medicare IM Given:    Medicare IM give by:    Date Additional Medicare IM Given:    Additional Medicare Important Message give by:     If discussed at [place] of Stay Meetings, dates discussed:    Additional Comments:  Zenon Mayo, RN 12/18/2014, 12:06 PM

## 2014-12-18 NOTE — Progress Notes (Signed)
Pt. Received discharge instructions and prescriptions. Educated pt. On follow-up appointments and importance of not drinking/abusing alcohol during periods of stress. Removed IV. No skin issues noted. All questions answered. No further needs noted at this time.

## 2014-12-18 NOTE — Discharge Summary (Signed)
[name], is a 44 y.o. male  DOB 10-21-1970  MRN [id number].  Admission date:  12/16/2014  Admitting Physician  Cherene Altes, MD  Discharge Date:  12/18/2014   Primary MD  Angelica Chessman, MD  Recommendations for primary care physician for things to follow:   X CBC, CMP, lipase and CA 19.9 in 7-10 days. He must follow with GI closely within a week   Admission Diagnosis  abd pain   Discharge Diagnosis  abd pain     Active Problems:   TOBACCO ABUSE   [name] Parkinson White pattern seen on electrocardiogram   Alcohol dependence   Chronic pancreatitis   Alcohol-induced acute pancreatitis   Pancreatitis, alcoholic, acute      Past Medical History  Diagnosis Date  . Hypertension   . Asthma   . Pancreatitis   . Cocaine abuse   . Depression   . H/O suicide attempt   . Heart murmur     "when he was little" (03/06/2013)  . Shortness of breath     "can happen at anytime" (03/06/2013)  . Anemia   . H/O hiatal hernia   . GERD (gastroesophageal reflux disease)   . Anxiety   . WPW (Wolff-Parkinson-White syndrome)     [name] 03/06/2013  . High cholesterol   . Migraine     "monthly" (01/30/2014)  . Arthritis     "knees; arms; elbows" (01/30/2014)  . Femoral condyle fracture 03/08/2014    left medial/notes 03/09/2014  . Alcoholism /alcohol abuse     Past Surgical History  Procedure Laterality Date  . Facial fracture surgery Left 1990's    "result of trauma"   . Eye surgery Left 1990's    "result of trauma"   . Left heart catheterization with coronary angiogram Right 03/07/2013    Procedure: LEFT HEART CATHETERIZATION WITH CORONARY ANGIOGRAM;  Surgeon: [name], MD;  Location: [facility];  Service: Cardiovascular;  Laterality: Right;       HPI  from the history and physical done on  the day of admission:    44 year old man with Hx of hypertension, hyperlipidemia, asthma, GERD, depression, anxiety, cocaine abuse, alcohol abuse, and alcoholic pancreatitis who presented with nausea, vomiting, and abdominal pain for 2 days. Lipase is elevated at 150, consistent with pancreatitis. The patient admits to drinking alcohol immediately prior to the onset of his symptoms. His symptoms then worsened when he attempted to eat "leftover barbecue from the refrigerator." He was evaluated in the emergency room where acute pancreatitis.     Hospital Course:     1. Acute on chronic alcoholic pancreatitis. Exam unremarkable, next day of his admission patient appeared to be in no distress and ambulating in his room, counseled to quit alcohol, says last drink was 7 days ago.   He is now pacing around in the hallway and wants to be discharged today, have counseled him to quit alcohol, given impression instructions to go on a soft diet and advance gradually. Have requested him to  follow with GI outpatient.  Of note last admission patient had a CT scan which raised suspicion for a few cystic lesions, MRCP was repeated here which is consistent with possible small pseudocysts, CA 19.9 was 77 which is borderline high and could be due to acute pancreatitis. Have requested him to follow with GI closely. Will request GI and PCP to monitor CA 19.9 and if needed can consider outpatient EUS.   2. Smoking, Alcohol abuse and polysubstance abuse. Counseled to quit all. No signs of DTs. Last alcoholic drink 6 days ago. Continue folic acid and thiamine.   3. ? pancreatic cyst on CT scan. Check CA 19.9 and MRCP. Will require outpatient GI follow-up as in #1 above.   4. Mild transaminitis due to alcohol abuse. Imrproved.   5. Depression. Home medications continued.       Discharge Condition: Fair  Follow UP  Follow-up Information    Follow up with JEGEDE, OLUGBEMIGA, MD. Schedule an appointment as  soon as possible for a visit in 1 week.   Specialty:  Internal Medicine   Contact information:   [facility] [id number] [phone]       Follow up with Silvano Rusk, MD. Schedule an appointment as soon as possible for a visit in 1 week.   Specialty:  Gastroenterology   Why:  high CA19-9   Contact information:   [address] [place] [address] [phone]        Consults obtained - none  Diet and Activity recommendation: See Discharge Instructions below  Discharge Instructions           Discharge Instructions    Discharge instructions    Complete by:  As directed   Follow with Primary MD Angelica Chessman, MD in 7 days   Get CBC, CMP, CA19-9,  2 view Chest X ray checked  by Primary MD next visit.    Activity: As tolerated with Full fall precautions use walker/cane & assistance as needed   Disposition Home     Diet: Low Fat soft diet for 1 week then advance gradually to Heart Healthy.  For Heart failure patients - Check your Weight same time everyday, if you gain over 2 pounds, or you develop in leg swelling, experience more shortness of breath or chest pain, call your Primary MD immediately. Follow Cardiac Low Salt Diet and 1.5 lit/day fluid restriction.   On your next visit with your primary care physician please Get Medicines reviewed and adjusted.   Please request your Prim.MD to go over all Hospital Tests and Procedure/Radiological results at the follow up, please get all Hospital records sent to your Prim MD by signing hospital release before you go home.   If you experience worsening of your admission symptoms, develop shortness of breath, life threatening emergency, suicidal or homicidal thoughts you must seek medical attention immediately by calling 911 or calling your MD immediately  if symptoms less severe.  You Must read complete instructions/literature along with all the possible adverse reactions/side effects for all the  Medicines you take and that have been prescribed to you. Take any new Medicines after you have completely understood and accpet all the possible adverse reactions/side effects.   Do not drive, operating heavy machinery, perform activities at heights, swimming or participation in water activities or provide baby sitting services if your were admitted for syncope or siezures until you have seen by Primary MD or a Neurologist and advised to do so again.  Do  not drive when taking Pain medications.    Do not take more than prescribed Pain, Sleep and Anxiety Medications  Special Instructions: If you have smoked or chewed Tobacco  in the last 2 yrs please stop smoking, stop any regular Alcohol  and or any Recreational drug use.  Wear Seat belts while driving.   Please note  You were cared for by a hospitalist during your hospital stay. If you have any questions about your discharge medications or the care you received while you were in the hospital after you are discharged, you can call the unit and asked to speak with the hospitalist on call if the hospitalist that took care of you is not available. Once you are discharged, your primary care physician will handle any further medical issues. Please note that NO REFILLS for any discharge medications will be authorized once you are discharged, as it is imperative that you return to your primary care physician (or establish a relationship with a primary care physician if you do not have one) for your aftercare needs so that they can reassess your need for medications and monitor your lab values.     Increase activity slowly    Complete by:  As directed              Discharge Medications       Medication List    TAKE these medications        albuterol 108 (90 BASE) MCG/ACT inhaler  Commonly known as:  PROVENTIL HFA;VENTOLIN HFA  Inhale 2 puffs into the lungs every 6 (six) hours as needed for wheezing or shortness of breath (wheezing).      diphenhydrAMINE 25 MG tablet  Commonly known as:  BENADRYL  Take 25 mg by mouth every 6 (six) hours as needed for allergies.     folic acid 1 MG tablet  Commonly known as:  FOLVITE  Take 1 tablet (1 mg total) by mouth daily.     gi cocktail Susp suspension  Take 30 mLs by mouth 3 (three) times daily. Shake well.     HYDROcodone-acetaminophen 7.5-325 MG per tablet  Commonly known as:  NORCO  Take 1 tablet by mouth every 6 (six) hours as needed for moderate pain or severe pain.     lipase/protease/amylase 12000 UNITS Cpep capsule  Commonly known as:  CREON  Take 2 capsules (24,000 Units total) by mouth 3 (three) times daily with meals.     loperamide 2 MG capsule  Commonly known as:  IMODIUM  Take 1-2 capsules (2-4 mg total) by mouth as needed for diarrhea or loose stools.     loratadine 10 MG tablet  Commonly known as:  CLARITIN  Take 1 tablet (10 mg total) by mouth daily.     metoprolol tartrate 25 MG tablet  Commonly known as:  LOPRESSOR  Take 1 tablet (25 mg total) by mouth 2 (two) times daily.     multivitamin capsule  Take 1 capsule by mouth daily.     omeprazole 20 MG capsule  Commonly known as:  PRILOSEC  Take 1 capsule (20 mg total) by mouth daily.     ondansetron 8 MG tablet  Commonly known as:  ZOFRAN  Take 1 tablet (8 mg total) by mouth every 8 (eight) hours as needed for nausea.     pantoprazole 20 MG tablet  Commonly known as:  PROTONIX  Take 1 tablet (20 mg total) by mouth daily.     promethazine 25 MG tablet  Commonly known as:  PHENERGAN  Take 1 tablet (25 mg total) by mouth every 6 (six) hours as needed for nausea.     QUEtiapine 100 MG tablet  Commonly known as:  SEROQUEL  Take 1 tablet (100 mg total) by mouth at bedtime. For mood control     sertraline 25 MG tablet  Commonly known as:  ZOLOFT  Take 75 mg by mouth daily.     sucralfate 1 G tablet  Commonly known as:  CARAFATE  Take 1 tablet (1 g total) by mouth 4 (four) times daily -  with  meals and at bedtime.        Major procedures and Radiology Reports - PLEASE review detailed and final reports for all details, in brief -       Mr Abdomen Mrcp Wo Cm  12/18/2014   CLINICAL DATA:  History of pancreatitis. Hypertension. Hyperlipidemia. Co can't alcohol abuse.  EXAM: MRI ABDOMEN WITHOUT CONTRAST  (INCLUDING MRCP)  TECHNIQUE: Multiplanar multisequence MR imaging of the abdomen was performed. Heavily T2-weighted images of the biliary and pancreatic ducts were obtained, and three-dimensional MRCP images were rendered by post processing.  COMPARISON:  05/25/2014 CT.  11/20/2013 MRI.  FINDINGS: Mild to moderate motion degradation. The dedicated MRCP sequences are particularly particular motion degraded.  Lower chest: Normal heart size without pericardial or pleural effusion.  Hepatobiliary: Normal liver. Normal gallbladder, without intra or extrahepatic biliary ductal dilatation. No evidence of choledocholithiasis.  Pancreas: Inflammation is moderate, surrounding the pancreatic tail and distal body. Example series 11 including on image 13. No dominant pancreatic mass. No pancreatic duct dilatation. A peripancreatic fluid collection measures 1.3 cm in the gastrosplenic ligament on image 11 of series 11. A cystic lesion within the pancreatic body/ tail junction (likely upstream from the previously described lesion) measures 9 mm on image 16 of series 11. A pancreatic body cystic lesion measures 1.7 cm on image 20 of series 11.  Spleen: Normal  Adrenals/Urinary Tract: Normal adrenal glands. Normal kidneys, without hydronephrosis.  Stomach/Bowel: The stomach appears thick walled, but is underdistended. Measures up to 2.4 cm along the greater curvature in the gastric body on image 8 of series 6. Abdominal bowel loops within normal limits.  Vascular/Lymphatic: Normal caliber of the aorta and branch vessels. No retroperitoneal or retrocrural adenopathy.  Other: Trace ascites in the right paracolic  gutter and along the greater curvature of the stomach.  Musculoskeletal: No acute osseous abnormality.  IMPRESSION: 1. Mild to moderate motion degradation. 2. Moderate peripancreatic inflammation, centered about the tail and upstream body. 3. 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 tiny pancreatic tail cystic lesion has resolved. 4. Small fluid collection in the gastrosplenic ligament likely represents an early pseudocyst. 5. Although the stomach is underdistended, wall thickening is suspected and may represent secondary gastritis. 6. Small volume ascites. 7. No evidence of choledocholithiasis or biliary duct dilatation. 8. No evidence of pancreas divisum.   Electronically Signed   By: Abigail Miyamoto M.D.   On: 12/18/2014 08:25   Mr 3d Recon At Scanner  12/18/2014   CLINICAL DATA:  History of pancreatitis. Hypertension. Hyperlipidemia. Co can't alcohol abuse.  EXAM: MRI ABDOMEN WITHOUT CONTRAST  (INCLUDING MRCP)  TECHNIQUE: Multiplanar multisequence MR imaging of the abdomen was performed. Heavily T2-weighted images of the biliary and pancreatic ducts were obtained, and three-dimensional MRCP images were rendered by post processing.  COMPARISON:  05/25/2014 CT.  11/20/2013 MRI.  FINDINGS:  Mild to moderate motion degradation. The dedicated MRCP sequences are particularly particular motion degraded.  Lower chest: Normal heart size without pericardial or pleural effusion.  Hepatobiliary: Normal liver. Normal gallbladder, without intra or extrahepatic biliary ductal dilatation. No evidence of choledocholithiasis.  Pancreas: Inflammation is moderate, surrounding the pancreatic tail and distal body. Example series 11 including on image 13. No dominant pancreatic mass. No pancreatic duct dilatation. A peripancreatic fluid collection measures 1.3 cm in the gastrosplenic ligament on image 11 of series 11. A cystic lesion within the pancreatic body/ tail  junction (likely upstream from the previously described lesion) measures 9 mm on image 16 of series 11. A pancreatic body cystic lesion measures 1.7 cm on image 20 of series 11.  Spleen: Normal  Adrenals/Urinary Tract: Normal adrenal glands. Normal kidneys, without hydronephrosis.  Stomach/Bowel: The stomach appears thick walled, but is underdistended. Measures up to 2.4 cm along the greater curvature in the gastric body on image 8 of series 6. Abdominal bowel loops within normal limits.  Vascular/Lymphatic: Normal caliber of the aorta and branch vessels. No retroperitoneal or retrocrural adenopathy.  Other: Trace ascites in the right paracolic gutter and along the greater curvature of the stomach.  Musculoskeletal: No acute osseous abnormality.  IMPRESSION: 1. Mild to moderate motion degradation. 2. Moderate peripancreatic inflammation, centered about the tail and upstream body. 3. 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 tiny pancreatic tail cystic lesion has resolved. 4. Small fluid collection in the gastrosplenic ligament likely represents an early pseudocyst. 5. Although the stomach is underdistended, wall thickening is suspected and may represent secondary gastritis. 6. Small volume ascites. 7. No evidence of choledocholithiasis or biliary duct dilatation. 8. No evidence of pancreas divisum.   Electronically Signed   By: Abigail Miyamoto M.D.   On: 12/18/2014 08:25   Ct Angio Chest Aorta W/cm &/or Wo/cm  11/26/2014   CLINICAL DATA:  Acute onset of dull achy pain at the central chest and underneath the left breast. Current history of Wolff-Parkinson-White syndrome. Initial encounter.  EXAM: CT ANGIOGRAPHY CHEST WITH CONTRAST  TECHNIQUE: Multidetector CT imaging of the chest was performed using the standard protocol during bolus administration of intravenous contrast. Multiplanar CT image reconstructions and MIPs were obtained to evaluate the  vascular anatomy.  CONTRAST:  143mL OMNIPAQUE IOHEXOL 350 MG/ML SOLN  COMPARISON:  Chest radiograph from 05/27/2014  FINDINGS: There is no evidence of aortic dissection. There is no evidence of aneurysmal dilatation. No calcific atherosclerotic disease is seen. The great vessels are grossly unremarkable in appearance.  There is no evidence of pulmonary embolus.  Scattered blebs are noted at the right lung apex, with minimal underlying emphysema seen. The lungs are otherwise grossly clear. There is no evidence of significant focal consolidation, pleural effusion or pneumothorax. No masses are identified; no abnormal focal contrast enhancement is seen.  The mediastinum is unremarkable in appearance. No mediastinal lymphadenopathy is seen. No pericardial effusion is identified. No axillary lymphadenopathy is seen. The visualized portions of the thyroid gland are unremarkable in appearance.  The visualized portions of the liver and spleen are unremarkable. Two cystic lesions are noted at the pancreatic body, measuring 2.3 cm and 1.3 cm in size. Would correlate with pancreatic lab values and perform MRCP for further evaluation. The visualized portions of the gallbladder, adrenal glands and kidneys are unremarkable in appearance.  No acute osseous abnormalities are seen.  Review of the MIP images confirms the above findings.  IMPRESSION: 1. No evidence of aortic dissection. No evidence of aneurysmal dilatation. No calcific atherosclerotic disease seen. 2. No evidence of pulmonary embolus. 3. Scattered blebs at the right lung apex, with minimal underlying emphysema noted. 4. Two cystic lesions seen at the pancreatic body, measuring 2.3 cm and 1.3 cm in size. Malignancy cannot be entirely excluded. Would correlate with pancreatic lab values and recommend MRCP for further evaluation.   Electronically Signed   By: Garald Balding M.D.   On: 11/26/2014 23:09    Micro Results      No results found for this or any previous  visit (from the past 240 hour(s)).     Today   Subjective    [name] today has no headache,no chest abdominal pain,no new weakness tingling or numbness, feels much better wants to go home today.     Objective   Blood pressure 160/99, pulse 86, temperature 98.3 F (36.8 C), temperature source Oral, resp. rate 20, height 5\' 9"  (1.753 m), weight 67.1 kg (147 lb 14.9 oz), SpO2 100 %.   Intake/Output Summary (Last 24 hours) at 12/18/14 1028 Last data filed at 12/18/14 1026  Gross per 24 hour  Intake    870 ml  Output    900 ml  Net    -30 ml    Exam Awake Alert, Oriented x 3, No new F.N deficits, Normal affect Ranger.AT,PERRAL Supple Neck,No JVD, No cervical lymphadenopathy appriciated.  Symmetrical Chest wall movement, Good air movement bilaterally, CTAB RRR,No Gallops,Rubs or new Murmurs, No Parasternal Heave +ve B.Sounds, Abd Soft, Non tender, No organomegaly appriciated, No rebound -guarding or rigidity. No Cyanosis, Clubbing or edema, No new Rash or bruise   Data Review   CBC w Diff:  Lab Results  Component Value Date   WBC 6.4 12/18/2014   HGB 12.0* 12/18/2014   HCT 36.6* 12/18/2014   PLT 135* 12/18/2014   LYMPHOPCT 34 11/16/2014   MONOPCT 10 11/16/2014   EOSPCT 3 11/16/2014   BASOPCT 0 11/16/2014    CMP:  Lab Results  Component Value Date   NA 137 12/18/2014   K 4.2 12/18/2014   CL 104 12/18/2014   CO2 23 12/18/2014   BUN <5* 12/18/2014   CREATININE 0.62 12/18/2014   CREATININE 0.65 11/08/2012   PROT 6.6 12/18/2014   ALBUMIN 3.4* 12/18/2014   BILITOT 0.9 12/18/2014   ALKPHOS 111 12/18/2014   AST 49* 12/18/2014   ALT 40 12/18/2014  .   Total Time in preparing paper work, data evaluation and todays exam - 35 minutes  [name] M.D on 12/18/2014 at 10:28 AM  [facility]   Office  [phone]

## 2014-12-18 NOTE — Plan of Care (Signed)
Problem: Phase II Progression Outcomes Goal: Nausea/vomiting controlled with medication Outcome: Not Applicable Date Met:  89/78/47 No c/o of N/V

## 2014-12-31 ENCOUNTER — Encounter (HOSPITAL_COMMUNITY): Payer: Self-pay | Admitting: *Deleted

## 2014-12-31 ENCOUNTER — Emergency Department (HOSPITAL_COMMUNITY)
Admission: EM | Admit: 2014-12-31 | Discharge: 2014-12-31 | Disposition: A | Discharge status: Home / Self Care | Payer: Self-pay | Attending: Emergency Medicine | Admitting: Emergency Medicine

## 2014-12-31 DIAGNOSIS — M158 Other polyosteoarthritis: Secondary | ICD-10-CM | POA: Insufficient documentation

## 2014-12-31 DIAGNOSIS — J45909 Unspecified asthma, uncomplicated: Secondary | ICD-10-CM | POA: Insufficient documentation

## 2014-12-31 DIAGNOSIS — K860 Alcohol-induced chronic pancreatitis: Secondary | ICD-10-CM | POA: Insufficient documentation

## 2014-12-31 DIAGNOSIS — Z79899 Other long term (current) drug therapy: Secondary | ICD-10-CM | POA: Insufficient documentation

## 2014-12-31 DIAGNOSIS — Z87828 Personal history of other (healed) physical injury and trauma: Secondary | ICD-10-CM | POA: Insufficient documentation

## 2014-12-31 DIAGNOSIS — R011 Cardiac murmur, unspecified: Secondary | ICD-10-CM | POA: Insufficient documentation

## 2014-12-31 DIAGNOSIS — R197 Diarrhea, unspecified: Secondary | ICD-10-CM | POA: Insufficient documentation

## 2014-12-31 DIAGNOSIS — F329 Major depressive disorder, single episode, unspecified: Secondary | ICD-10-CM | POA: Insufficient documentation

## 2014-12-31 DIAGNOSIS — K219 Gastro-esophageal reflux disease without esophagitis: Secondary | ICD-10-CM | POA: Insufficient documentation

## 2014-12-31 DIAGNOSIS — Z9889 Other specified postprocedural states: Secondary | ICD-10-CM | POA: Insufficient documentation

## 2014-12-31 DIAGNOSIS — I10 Essential (primary) hypertension: Secondary | ICD-10-CM | POA: Insufficient documentation

## 2014-12-31 DIAGNOSIS — D649 Anemia, unspecified: Secondary | ICD-10-CM | POA: Insufficient documentation

## 2014-12-31 DIAGNOSIS — Z8639 Personal history of other endocrine, nutritional and metabolic disease: Secondary | ICD-10-CM | POA: Insufficient documentation

## 2014-12-31 DIAGNOSIS — Z720 Tobacco use: Secondary | ICD-10-CM | POA: Insufficient documentation

## 2014-12-31 LAB — CBC
HEMATOCRIT: 40.5 % (ref 39.0–52.0)
Hemoglobin: 13.6 g/dL (ref 13.0–17.0)
MCH: 32.2 pg (ref 26.0–34.0)
MCHC: 33.6 g/dL (ref 30.0–36.0)
MCV: 96 fL (ref 78.0–100.0)
Platelets: 247 10*3/uL (ref 150–400)
RBC: 4.22 MIL/uL (ref 4.22–5.81)
RDW: 14 % (ref 11.5–15.5)
WBC: 9.2 10*3/uL (ref 4.0–10.5)

## 2014-12-31 LAB — COMPREHENSIVE METABOLIC PANEL
ALBUMIN: 4.8 g/dL (ref 3.5–5.0)
ALT: 55 U/L (ref 17–63)
AST: 59 U/L — AB (ref 15–41)
Alkaline Phosphatase: 125 U/L (ref 38–126)
Anion gap: 10 (ref 5–15)
BUN: 6 mg/dL (ref 6–20)
CHLORIDE: 102 mmol/L (ref 101–111)
CO2: 25 mmol/L (ref 22–32)
Calcium: 9.5 mg/dL (ref 8.9–10.3)
Creatinine, Ser: 0.74 mg/dL (ref 0.61–1.24)
GFR calc Af Amer: >60 mL/min (ref >60)
GLUCOSE: 97 mg/dL (ref 65–99)
POTASSIUM: 4.1 mmol/L (ref 3.5–5.1)
Sodium: 137 mmol/L (ref 135–145)
Total Bilirubin: 1 mg/dL (ref 0.3–1.2)
Total Protein: 9.2 g/dL — ABNORMAL HIGH (ref 6.5–8.1)

## 2014-12-31 LAB — URINALYSIS, ROUTINE W REFLEX MICROSCOPIC
BILIRUBIN URINE: NEGATIVE
Glucose, UA: NEGATIVE mg/dL
Hgb urine dipstick: NEGATIVE
Ketones, ur: NEGATIVE mg/dL
Leukocytes, UA: NEGATIVE
NITRITE: NEGATIVE
PH: 5 (ref 5.0–8.0)
Protein, ur: NEGATIVE mg/dL
SPECIFIC GRAVITY, URINE: 1.017 (ref 1.005–1.030)
Urobilinogen, UA: 0.2 mg/dL (ref 0.0–1.0)

## 2014-12-31 LAB — LIPASE, BLOOD: LIPASE: 108 U/L — AB (ref 22–51)

## 2014-12-31 MED ORDER — OXYCODONE-ACETAMINOPHEN 7.5-325 MG PO TABS
1.0000 | ORAL_TABLET | Freq: Once | ORAL | Status: AC
Start: 2014-12-31 — End: 2014-12-31
  Administered 2014-12-31: 1 via ORAL
  Filled 2014-12-31: qty 1

## 2014-12-31 MED ORDER — ONDANSETRON 4 MG PO TBDP
4.0000 mg | ORAL_TABLET | Freq: Once | ORAL | Status: AC
Start: 2014-12-31 — End: 2014-12-31
  Administered 2014-12-31: 4 mg via ORAL
  Filled 2014-12-31: qty 1

## 2014-12-31 MED ORDER — GI COCKTAIL ~~LOC~~
30.0000 mL | Freq: Once | ORAL | Status: AC
  Administered 2014-12-31: 30 mL via ORAL
  Filled 2014-12-31: qty 30

## 2014-12-31 MED ORDER — ONDANSETRON HCL 4 MG/2ML IJ SOLN: 4.0000 mg | Freq: Once | INTRAMUSCULAR | Status: DC

## 2014-12-31 NOTE — Discharge Instructions (Signed)
Low-Fat Diet for Pancreatitis or Gallbladder Conditions A low-fat diet can be helpful if you have pancreatitis or a gallbladder condition. With these conditions, your pancreas and gallbladder have trouble digesting fats. A healthy eating plan with less fat will help rest your pancreas and gallbladder and reduce your symptoms. WHAT DO I NEED TO KNOW ABOUT THIS DIET?  Eat a low-fat diet.  Reduce your fat intake to less than 20-30% of your total daily calories. This is less than 50-60 g of fat per day.  Remember that you need some fat in your diet. Ask your dietician what your daily goal should be.  Choose nonfat and low-fat healthy foods. Look for the words "nonfat," "low fat," or "fat free."  As a guide, look on the label and choose foods with less than 3 g of fat per serving. Eat only one serving.  Avoid alcohol.  Do not smoke. If you need help quitting, talk with your health care provider.  Eat small frequent meals instead of three large heavy meals. WHAT FOODS CAN I EAT? Grains Include healthy grains and starches such as potatoes, wheat bread, fiber-rich cereal, and brown rice. Choose whole grain options whenever possible. In adults, whole grains should account for 45-65% of your daily calories.  Fruits and Vegetables Eat plenty of fruits and vegetables. Fresh fruits and vegetables add fiber to your diet. Meats and Other Protein Sources Eat lean meat such as chicken and pork. Trim any fat off of meat before cooking it. Eggs, fish, and beans are other sources of protein. In adults, these foods should account for 10-35% of your daily calories. Dairy Choose low-fat milk and dairy options. Dairy includes fat and protein, as well as calcium.  Fats and Oils Limit high-fat foods such as fried foods, sweets, baked goods, sugary drinks.  Other Creamy sauces and condiments, such as mayonnaise, can add extra fat. Think about whether or not you need to use them, or use smaller amounts or low fat  options. WHAT FOODS ARE NOT RECOMMENDED?  High fat foods, such as:  [employer].  Ice cream.  [place] toast.  Sweet rolls.  Pizza.  Cheese bread.  Foods covered with batter, butter, creamy sauces, or cheese.  Fried foods.  Sugary drinks and desserts.  Foods that cause gas or bloating Document Released: 04/30/2013 Document Reviewed: 04/30/2013 [facility] Patient Information 2015 [place], [place]. This information is not intended to replace advice given to you by your health care provider. Make sure you discuss any questions you have with your health care provider.  Please contact your primary care provider request follow-up evaluation in 2 days for reevaluation. Please stop drinking alcohol, this is likely worsening your current symptoms. Please monitor for new or worsening signs or symptoms follow-up immediately if any present.

## 2014-12-31 NOTE — ED Notes (Addendum)
Per EMS, pt complains of abdominal pain, n/v/d starting today. Pt has a hx of pancreatitis. Pt denies alcohol use. No rebound tenderness. Pt was seen 2 weeks ago for same, was admitted for pancreatitis. Pt states he took hydrocodone today at 12PM with no relief.

## 2014-12-31 NOTE — ED Provider Notes (Signed)
CSN: [id number]     Arrival date & time 12/31/14  1356 History   First MD Initiated Contact with Patient 12/31/14 1711     Chief Complaint  Patient presents with  . Abdominal Pain  . Emesis  . Diarrhea    HPI   44 year old male presents today with abdominal pain. Patient has a significant past medical history of alcohol-induced pancreatitis, cocaine abuse, depression, GERD. Patient was discharged from the hospital on 12/18/2014 due to abdominal pain. Patient was given prescription for Percocet, GI cocktail, follow-up with gastroenterology, and primary care in 1 week. Patient reports that he did not make these follow-up visits. He notes that he has a scheduled visit with primary care on 01/15/2015. Patient notes that today he ran out of his Percocet, was still having abdominal pain. Patient reports that he's had nausea and vomiting after attempting a change in his diet to solid foods. Patient reports the abdominal pain is in the epigastric and left upper quadrant, denies fever, chills.   Past Medical History  Diagnosis Date  . Hypertension   . Asthma   . Pancreatitis   . Cocaine abuse   . Depression   . H/O suicide attempt   . Heart murmur     "when he was little" (03/06/2013)  . Shortness of breath     "can happen at anytime" (03/06/2013)  . Anemia   . H/O hiatal hernia   . GERD (gastroesophageal reflux disease)   . Anxiety   . WPW (Wolff-Parkinson-White syndrome)     [name] 03/06/2013  . High cholesterol   . Migraine     "monthly" (01/30/2014)  . Arthritis     "knees; arms; elbows" (01/30/2014)  . Femoral condyle fracture 03/08/2014    left medial/notes 03/09/2014  . Alcoholism /alcohol abuse    Past Surgical History  Procedure Laterality Date  . Facial fracture surgery Left 1990's    "result of trauma"   . Eye surgery Left 1990's    "result of trauma"   . Left heart catheterization with coronary angiogram Right 03/07/2013    Procedure: LEFT HEART CATHETERIZATION WITH  CORONARY ANGIOGRAM;  Surgeon: [name], MD;  Location: [facility];  Service: Cardiovascular;  Laterality: Right;   Family History  Problem Relation Age of Onset  . Hypertension Other   . Coronary artery disease Other    Social History  Substance Use Topics  . Smoking status: Current Every Day Smoker -- 0.50 packs/day for 30 years    Types: Cigarettes  . Smokeless tobacco: Current User    Types: Chew  . Alcohol Use: 3.6 oz/week    6 Cans of beer per week     Comment: Last drink Thursday 12/11/14 per patient    Review of Systems  All other systems reviewed and are negative.   Allergies  Shellfish-derived products and Trazodone and nefazodone  Home Medications   Prior to Admission medications   Medication Sig Start Date End Date Taking? Authorizing Provider  albuterol (PROVENTIL HFA;VENTOLIN HFA) 108 (90 BASE) MCG/ACT inhaler Inhale 2 puffs into the lungs every 6 (six) hours as needed for wheezing or shortness of breath (wheezing).    Yes Historical Provider, MD  Alum & Mag Hydroxide-Simeth (GI COCKTAIL) SUSP suspension Take 30 mLs by mouth 3 (three) times daily. Shake well. 12/18/14  Yes [name], MD  diphenhydrAMINE (BENADRYL) 25 MG tablet Take 25 mg by mouth every 6 (six) hours as needed for allergies.   Yes Historical Provider,  MD  folic acid (FOLVITE) 1 MG tablet Take 1 tablet (1 mg total) by mouth daily. 03/21/14  Yes Albertine Patricia, MD  HYDROcodone-acetaminophen (NORCO) 7.5-325 MG per tablet Take 1 tablet by mouth every 6 (six) hours as needed for moderate pain or severe pain. 12/18/14  Yes [name], MD  lipase/protease/amylase (CREON) 12000 UNITS CPEP capsule Take 2 capsules (24,000 Units total) by mouth 3 (three) times daily with meals. 02/06/14  Yes [name], MD  loperamide (IMODIUM) 2 MG capsule Take 1-2 capsules (2-4 mg total) by mouth as needed for diarrhea or loose stools. 01/23/14  Yes Marianne L York, PA-C  loratadine (CLARITIN) 10 MG  tablet Take 1 tablet (10 mg total) by mouth daily. Patient taking differently: Take 10 mg by mouth daily as needed for allergies.  02/25/14  Yes Marianne L York, PA-C  metoprolol tartrate (LOPRESSOR) 25 MG tablet Take 1 tablet (25 mg total) by mouth 2 (two) times daily. 02/06/14  Yes [name], MD  Multiple Vitamin (MULTIVITAMIN) capsule Take 1 capsule by mouth daily. 02/06/14  Yes [name], MD  ondansetron (ZOFRAN) 8 MG tablet Take 1 tablet (8 mg total) by mouth every 8 (eight) hours as needed for nausea. 12/15/14  Yes Orlie Dakin, MD  pantoprazole (PROTONIX) 20 MG tablet Take 1 tablet (20 mg total) by mouth daily. 11/26/14  Yes Charlesetta Shanks, MD  QUEtiapine (SEROQUEL) 100 MG tablet Take 1 tablet (100 mg total) by mouth at bedtime. For mood control 01/15/14  Yes Shanker Kristeen Mans, MD  sertraline (ZOLOFT) 25 MG tablet Take 75 mg by mouth daily.   Yes Historical Provider, MD  omeprazole (PRILOSEC) 20 MG capsule Take 1 capsule (20 mg total) by mouth daily. Patient not taking: Reported on 12/31/2014 08/26/14   [name], PA-C  promethazine (PHENERGAN) 25 MG tablet Take 1 tablet (25 mg total) by mouth every 6 (six) hours as needed for nausea. Patient not taking: Reported on 12/31/2014 12/13/14   Domenic Moras, PA-C  sucralfate (CARAFATE) 1 G tablet Take 1 tablet (1 g total) by mouth 4 (four) times daily -  with meals and at bedtime. Patient not taking: Reported on 12/31/2014 08/19/14   [name], MD   BP 144/96 mmHg  Pulse 70  Temp(Src) 98.2 F (36.8 C) (Oral)  Resp 18  SpO2 100% Physical Exam  Constitutional: He is oriented to person, place, and time. He appears well-developed and well-nourished.  HENT:  Head: Normocephalic and atraumatic.  Eyes: Conjunctivae are normal. Pupils are equal, round, and reactive to light. Right eye exhibits no discharge. Left eye exhibits no discharge. No scleral icterus.  Neck: Normal range of motion. No JVD present. No tracheal deviation present.   Cardiovascular: Normal rate and regular rhythm.   Pulmonary/Chest: Effort normal and breath sounds normal. No stridor. No respiratory distress. He has no wheezes. He has no rales. He exhibits no tenderness.  Abdominal: Soft. Bowel sounds are normal. He exhibits no distension and no mass. There is tenderness. There is no rebound and no guarding.  Minor tenderness to palpation of the epigastric region.  Musculoskeletal: Normal range of motion. He exhibits no edema or tenderness.  Neurological: He is alert and oriented to person, place, and time. Coordination normal.  Skin: Skin is warm and dry. No rash noted. No erythema. No pallor.  Psychiatric: He has a normal mood and affect. His behavior is normal. Judgment and thought content normal.  Nursing note and vitals reviewed.  ED Course  Procedures (including critical care time) Labs Review Labs Reviewed  LIPASE, BLOOD - Abnormal; Notable for the following:    Lipase 108 (*)    All other components within normal limits  COMPREHENSIVE METABOLIC PANEL - Abnormal; Notable for the following:    Total Protein 9.2 (*)    AST 59 (*)    All other components within normal limits  CBC  URINALYSIS, ROUTINE W REFLEX MICROSCOPIC (NOT AT [facility])    Imaging Review No results found. I have personally reviewed and evaluated these images and lab results as part of my medical decision-making.   EKG Interpretation None      MDM   Final diagnoses:  Alcohol-induced chronic pancreatitis    Labs: Lipase, CMP and CBC, urinalysis- significant for lipase at 108 improved from 13 days prior was 299  Imaging: None indicated   Consults:  Therapeutics: Zofran, GI cocktail, Percocet  Discharge Meds:   Assessment/Plan: Patient presents with abdominal pain, this is likely chronic. Patient has a history of chronic pancreatitis, labs markedly improved from previous visit. Patient reported to me that he has been unable to eat since his nausea and vomiting  after attempting solid foods today. During my evaluation I noted bread crumbs on patient's chest and gown, patient reports that he has not eaten anything. I showed him the bread crumbs, he reports that he was hungry and had graham crackers. Nursing staff notes that wife asked for 2 sandwiches, likely patient had been able to tolerate the sandwich. I gave him by mouth Percocet, GI cocktail with improvement in symptoms. Patient is tolerating by mouth without difficulty, has a history of chronic abdominal pain, this is unlikely an acute flare of his pancreatitis. Patient will be encouraged to follow-up with his primary care and GI specialist for further evaluation and management. He is given strict return precautions, verbalized understanding and agreement for today's plan.         Okey Regal, PA-C 01/01/15 1402  Orlie Dakin, MD 01/01/15 [phone]

## 2014-12-31 NOTE — ED Provider Notes (Signed)
Plains of epigastric pain onset this morning after eating grits and eggs. He was discharged from inpatient stay for alcoholic pancreatitis [id number]/61/9509. He admits to drinking one alcoholic drink 5 days ago. No other associated symptoms. Presently pain is moderate. Accompanied by mild nausea although he states he is hungry. On exam alert non-toxic-appearing abdomen nondistended normal active bowel sounds soft tender at epigastrium genitalia normal male  Orlie Dakin, MD 12/31/14 1759

## 2015-01-06 ENCOUNTER — Emergency Department (HOSPITAL_COMMUNITY)
Admission: EM | Admit: 2015-01-06 | Discharge: 2015-01-06 | Disposition: A | Discharge status: Home / Self Care | Payer: No Typology Code available for payment source | Attending: Emergency Medicine | Admitting: Emergency Medicine

## 2015-01-06 ENCOUNTER — Encounter (HOSPITAL_COMMUNITY): Payer: Self-pay | Admitting: Emergency Medicine

## 2015-01-06 DIAGNOSIS — E780 Pure hypercholesterolemia: Secondary | ICD-10-CM | POA: Insufficient documentation

## 2015-01-06 DIAGNOSIS — R011 Cardiac murmur, unspecified: Secondary | ICD-10-CM | POA: Insufficient documentation

## 2015-01-06 DIAGNOSIS — Y9389 Activity, other specified: Secondary | ICD-10-CM | POA: Diagnosis not present

## 2015-01-06 DIAGNOSIS — D649 Anemia, unspecified: Secondary | ICD-10-CM | POA: Insufficient documentation

## 2015-01-06 DIAGNOSIS — Y998 Other external cause status: Secondary | ICD-10-CM | POA: Diagnosis not present

## 2015-01-06 DIAGNOSIS — R630 Anorexia: Secondary | ICD-10-CM | POA: Insufficient documentation

## 2015-01-06 DIAGNOSIS — F329 Major depressive disorder, single episode, unspecified: Secondary | ICD-10-CM | POA: Insufficient documentation

## 2015-01-06 DIAGNOSIS — G43909 Migraine, unspecified, not intractable, without status migrainosus: Secondary | ICD-10-CM | POA: Insufficient documentation

## 2015-01-06 DIAGNOSIS — R109 Unspecified abdominal pain: Secondary | ICD-10-CM

## 2015-01-06 DIAGNOSIS — M1990 Unspecified osteoarthritis, unspecified site: Secondary | ICD-10-CM | POA: Diagnosis not present

## 2015-01-06 DIAGNOSIS — I10 Essential (primary) hypertension: Secondary | ICD-10-CM | POA: Insufficient documentation

## 2015-01-06 DIAGNOSIS — S3991XA Unspecified injury of abdomen, initial encounter: Secondary | ICD-10-CM | POA: Diagnosis not present

## 2015-01-06 DIAGNOSIS — R110 Nausea: Secondary | ICD-10-CM | POA: Insufficient documentation

## 2015-01-06 DIAGNOSIS — S46919A Strain of unspecified muscle, fascia and tendon at shoulder and upper arm level, unspecified arm, initial encounter: Secondary | ICD-10-CM | POA: Insufficient documentation

## 2015-01-06 DIAGNOSIS — Z79899 Other long term (current) drug therapy: Secondary | ICD-10-CM | POA: Insufficient documentation

## 2015-01-06 DIAGNOSIS — Y92481 Parking lot as the place of occurrence of the external cause: Secondary | ICD-10-CM | POA: Insufficient documentation

## 2015-01-06 DIAGNOSIS — S199XXA Unspecified injury of neck, initial encounter: Secondary | ICD-10-CM | POA: Insufficient documentation

## 2015-01-06 DIAGNOSIS — F419 Anxiety disorder, unspecified: Secondary | ICD-10-CM | POA: Diagnosis not present

## 2015-01-06 DIAGNOSIS — K219 Gastro-esophageal reflux disease without esophagitis: Secondary | ICD-10-CM | POA: Diagnosis not present

## 2015-01-06 DIAGNOSIS — Z720 Tobacco use: Secondary | ICD-10-CM | POA: Insufficient documentation

## 2015-01-06 DIAGNOSIS — J45909 Unspecified asthma, uncomplicated: Secondary | ICD-10-CM | POA: Insufficient documentation

## 2015-01-06 DIAGNOSIS — Z915 Personal history of self-harm: Secondary | ICD-10-CM | POA: Insufficient documentation

## 2015-01-06 DIAGNOSIS — S46819A Strain of other muscles, fascia and tendons at shoulder and upper arm level, unspecified arm, initial encounter: Secondary | ICD-10-CM

## 2015-01-06 LAB — COMPREHENSIVE METABOLIC PANEL
ALBUMIN: 4.1 g/dL (ref 3.5–5.0)
ALK PHOS: 104 U/L (ref 38–126)
ALT: 36 U/L (ref 17–63)
AST: 41 U/L (ref 15–41)
Anion gap: 9 (ref 5–15)
BILIRUBIN TOTAL: 0.7 mg/dL (ref 0.3–1.2)
BUN: 6 mg/dL (ref 6–20)
CALCIUM: 9.2 mg/dL (ref 8.9–10.3)
CO2: 25 mmol/L (ref 22–32)
Chloride: 107 mmol/L (ref 101–111)
Creatinine, Ser: 0.69 mg/dL (ref 0.61–1.24)
GFR calc Af Amer: >60 mL/min (ref >60)
GLUCOSE: 107 mg/dL — AB (ref 65–99)
Potassium: 4 mmol/L (ref 3.5–5.1)
Sodium: 141 mmol/L (ref 135–145)
TOTAL PROTEIN: 7.8 g/dL (ref 6.5–8.1)

## 2015-01-06 LAB — CBC WITH DIFFERENTIAL/PLATELET
BASOS ABS: 0 10*3/uL (ref 0.0–0.1)
BASOS PCT: 0 % (ref 0–1)
EOS ABS: 0.5 10*3/uL (ref 0.0–0.7)
EOS PCT: 4 % (ref 0–5)
HCT: 38.7 % — ABNORMAL LOW (ref 39.0–52.0)
Hemoglobin: 12.9 g/dL — ABNORMAL LOW (ref 13.0–17.0)
Lymphocytes Relative: 30 % (ref 12–46)
Lymphs Abs: 3.6 10*3/uL (ref 0.7–4.0)
MCH: 32.2 pg (ref 26.0–34.0)
MCHC: 33.3 g/dL (ref 30.0–36.0)
MCV: 96.5 fL (ref 78.0–100.0)
MONO ABS: 0.6 10*3/uL (ref 0.1–1.0)
Monocytes Relative: 5 % (ref 3–12)
Neutro Abs: 7.2 10*3/uL (ref 1.7–7.7)
Neutrophils Relative %: 61 % (ref 43–77)
PLATELETS: 185 10*3/uL (ref 150–400)
RBC: 4.01 MIL/uL — ABNORMAL LOW (ref 4.22–5.81)
RDW: 14.2 % (ref 11.5–15.5)
WBC: 11.8 10*3/uL — AB (ref 4.0–10.5)

## 2015-01-06 LAB — LIPASE, BLOOD: LIPASE: 44 U/L (ref 22–51)

## 2015-01-06 MED ORDER — HYDROMORPHONE HCL 1 MG/ML IJ SOLN
1.0000 mg | Freq: Once | INTRAMUSCULAR | Status: AC
  Administered 2015-01-06: 1 mg via INTRAVENOUS
  Filled 2015-01-06: qty 1

## 2015-01-06 MED ORDER — ONDANSETRON HCL 4 MG/2ML IJ SOLN
4.0000 mg | Freq: Once | INTRAMUSCULAR | Status: AC
  Administered 2015-01-06: 4 mg via INTRAVENOUS
  Filled 2015-01-06: qty 2

## 2015-01-06 MED ORDER — SODIUM CHLORIDE 0.9 % IV BOLUS (SEPSIS)
1000.0000 mL | Freq: Once | INTRAVENOUS | Status: AC
  Administered 2015-01-06: 1000 mL via INTRAVENOUS

## 2015-01-06 MED ORDER — ONDANSETRON HCL 8 MG PO TABS: 8.0000 mg | ORAL_TABLET | Freq: Three times a day (TID) | ORAL | Status: DC | PRN

## 2015-01-06 MED ORDER — GI COCKTAIL ~~LOC~~
30.0000 mL | Freq: Once | ORAL | Status: AC
  Administered 2015-01-06: 30 mL via ORAL
  Filled 2015-01-06: qty 30

## 2015-01-06 MED ORDER — HYDROMORPHONE HCL 1 MG/ML IJ SOLN
1.0000 mg | Freq: Once | INTRAMUSCULAR | Status: AC
Start: 2015-01-06 — End: 2015-01-06
  Administered 2015-01-06: 1 mg via INTRAVENOUS
  Filled 2015-01-06: qty 1

## 2015-01-06 MED ORDER — GI COCKTAIL ~~LOC~~: 30.0000 mL | Freq: Three times a day (TID) | ORAL | Status: DC

## 2015-01-06 NOTE — ED Provider Notes (Signed)
4:14 PM Patient is feeling better and would like to go home. Patient has no midline neck tenderness on my exam and no do not feel emergent C-spine imaging is indicated at this time. His abdominal pain is recurrent in much better. He is requesting a GI cocktail prescription for home which he has had in the past. Will also prescribe zofran and refer to GI.  Sherwood Gambler, MD 01/06/15 [phone]
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
## 2015-01-06 NOTE — ED Notes (Signed)
Pt is a&ox4 and denied questions, concerns r/t dc. Pt is ambulatory
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
## 2015-01-06 NOTE — Discharge Instructions (Signed)
If you were given medicines take as directed.  If you are on coumadin or contraceptives realize their levels and effectiveness is altered by many different medicines.  If you have any reaction (rash, tongues swelling, other) to the medicines stop taking and see a physician.    If your blood pressure was elevated in the ER make sure you follow up for management with a primary doctor or return for chest pain, shortness of breath or stroke symptoms.  Please follow up as directed and return to the ER or see a physician for new or worsening symptoms.  Thank you. Filed Vitals:   01/06/15 1330  BP: 154/104  Pulse: 92  Temp: 98.6 F (37 C)  TempSrc: Oral  Resp: 20  SpO2: 99%

## 2015-01-15 ENCOUNTER — Ambulatory Visit: Payer: Self-pay | Attending: Internal Medicine | Admitting: Internal Medicine

## 2015-01-15 ENCOUNTER — Encounter: Payer: Self-pay | Admitting: Internal Medicine

## 2015-01-15 VITALS — BP 117/78 | HR 103 | Temp 98.4°F | Resp 18 | Ht 69.0 in | Wt 132.2 lb

## 2015-01-15 DIAGNOSIS — R1013 Epigastric pain: Secondary | ICD-10-CM

## 2015-01-15 DIAGNOSIS — K219 Gastro-esophageal reflux disease without esophagitis: Secondary | ICD-10-CM

## 2015-01-15 DIAGNOSIS — I10 Essential (primary) hypertension: Secondary | ICD-10-CM

## 2015-01-15 DIAGNOSIS — F329 Major depressive disorder, single episode, unspecified: Secondary | ICD-10-CM

## 2015-01-15 DIAGNOSIS — N528 Other male erectile dysfunction: Secondary | ICD-10-CM

## 2015-01-15 DIAGNOSIS — K852 Alcohol induced acute pancreatitis without necrosis or infection: Secondary | ICD-10-CM

## 2015-01-15 DIAGNOSIS — G8929 Other chronic pain: Secondary | ICD-10-CM

## 2015-01-15 DIAGNOSIS — F32A Depression, unspecified: Secondary | ICD-10-CM

## 2015-01-15 MED ORDER — PANCRELIPASE (LIP-PROT-AMYL) 12000-38000 UNITS PO CPEP: 24000.0000 [IU] | ORAL_CAPSULE | Freq: Three times a day (TID) | ORAL | Status: DC

## 2015-01-15 MED ORDER — QUETIAPINE FUMARATE 100 MG PO TABS: 100.0000 mg | ORAL_TABLET | Freq: Every day | ORAL | Status: DC

## 2015-01-15 MED ORDER — SERTRALINE HCL 25 MG PO TABS: 75.0000 mg | ORAL_TABLET | Freq: Every day | ORAL | Status: DC

## 2015-01-15 MED ORDER — SILDENAFIL CITRATE 50 MG PO TABS: 50.0000 mg | ORAL_TABLET | Freq: Every day | ORAL | Status: DC | PRN

## 2015-01-15 MED ORDER — FOLIC ACID 1 MG PO TABS: 1.0000 mg | ORAL_TABLET | Freq: Every day | ORAL | Status: DC

## 2015-01-15 MED ORDER — ONDANSETRON HCL 8 MG PO TABS: 8.0000 mg | ORAL_TABLET | Freq: Three times a day (TID) | ORAL | Status: DC | PRN

## 2015-01-15 MED ORDER — PANTOPRAZOLE SODIUM 20 MG PO TBEC: 20.0000 mg | DELAYED_RELEASE_TABLET | Freq: Every day | ORAL | Status: DC

## 2015-01-15 MED ORDER — TRAMADOL HCL 50 MG PO TABS: 50.0000 mg | ORAL_TABLET | Freq: Three times a day (TID) | ORAL | Status: DC | PRN

## 2015-01-15 MED ORDER — METOPROLOL TARTRATE 25 MG PO TABS
25.0000 mg | ORAL_TABLET | Freq: Two times a day (BID) | ORAL | Status: DC
Start: 2015-01-15 — End: 2015-12-14

## 2015-01-15 MED ORDER — GI COCKTAIL ~~LOC~~: 30.0000 mL | Freq: Three times a day (TID) | ORAL | Status: DC
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
## 2015-01-15 NOTE — Progress Notes (Signed)
Clois Dupes, NP Student in chart for review of patient history before visit at [facility].

## 2015-01-15 NOTE — Patient Instructions (Signed)
Acute Pancreatitis Acute pancreatitis is a disease in which the pancreas becomes suddenly inflamed. The pancreas is a large gland located behind your stomach. The pancreas produces enzymes that help digest food. The pancreas also releases the hormones glucagon and insulin that help regulate blood sugar. Damage to the pancreas occurs when the digestive enzymes from the pancreas are activated and begin attacking the pancreas before being released into the intestine. Most acute attacks last a couple of days and can cause serious complications. Some people become dehydrated and develop low blood pressure. In severe cases, bleeding into the pancreas can lead to shock and can be life-threatening. The lungs, heart, and kidneys may fail. CAUSES  Pancreatitis can happen to anyone. In some cases, the cause is unknown. Most cases are caused by:  Alcohol abuse.  Gallstones. Other less common causes are:  Certain medicines.  Exposure to certain chemicals.  Infection.  Damage caused by an accident (trauma).  Abdominal surgery. SYMPTOMS   Pain in the upper abdomen that may radiate to the back.  Tenderness and swelling of the abdomen.  Nausea and vomiting. DIAGNOSIS  Your caregiver will perform a physical exam. Blood and stool tests may be done to confirm the diagnosis. Imaging tests may also be done, such as X-rays, CT scans, or an ultrasound of the abdomen. TREATMENT  Treatment usually requires a stay in the hospital. Treatment may include:  Pain medicine.  Fluid replacement through an intravenous line (IV).  Placing a tube in the stomach to remove stomach contents and control vomiting.  Not eating for 3 or 4 days. This gives your pancreas a rest, because enzymes are not being produced that can cause further damage.  Antibiotic medicines if your condition is caused by an infection.  Surgery of the pancreas or gallbladder. HOME CARE INSTRUCTIONS   Follow the diet advised by your  caregiver. This may involve avoiding alcohol and decreasing the amount of fat in your diet.  Eat smaller, more frequent meals. This reduces the amount of digestive juices the pancreas produces.  Drink enough fluids to keep your urine clear or pale yellow.  Only take over-the-counter or prescription medicines as directed by your caregiver.  Avoid drinking alcohol if it caused your condition.  Do not smoke.  Get plenty of rest.  Check your blood sugar at home as directed by your caregiver.  Keep all follow-up appointments as directed by your caregiver. SEEK MEDICAL CARE IF:   You do not recover as quickly as expected.  You develop new or worsening symptoms.  You have persistent pain, weakness, or nausea.  You recover and then have another episode of pain. SEEK IMMEDIATE MEDICAL CARE IF:   You are unable to eat or keep fluids down.  Your pain becomes severe.  You have a fever or persistent symptoms for more than 2 to 3 days.  You have a fever and your symptoms suddenly get worse.  Your skin or the white part of your eyes turn yellow (jaundice).  You develop vomiting.  You feel dizzy, or you faint.  Your blood sugar is high (over 300 mg/dL). MAKE SURE YOU:   Understand these instructions.  Will watch your condition.  Will get help right away if you are not doing well or get worse. Document Released: 04/25/2005 Document Revised: 10/25/2011 Document Reviewed: 08/04/2011 ExitCare Patient Information 2015 [employer]. This information is not intended to replace advice given to you by your health care provider. Make sure you discuss any questions you have   with your health care provider. ° °Abdominal Pain °Many things can cause abdominal pain. Usually, abdominal pain is not caused by a disease and will improve without treatment. It can often be observed and treated at home. Your health care provider will do a physical exam and possibly order blood tests and X-rays to help  determine the seriousness of your pain. However, in many cases, more time must pass before a clear cause of the pain can be found. Before that point, your health care provider may not know if you need more testing or further treatment. °HOME CARE INSTRUCTIONS  °Monitor your abdominal pain for any changes. The following actions may help to alleviate any discomfort you are experiencing: °· Only take over-the-counter or prescription medicines as directed by your health care provider. °· Do not take laxatives unless directed to do so by your health care provider. °· Try a clear liquid diet (broth, tea, or water) as directed by your health care provider. Slowly move to a bland diet as tolerated. °SEEK MEDICAL CARE IF: °· You have unexplained abdominal pain. °· You have abdominal pain associated with nausea or diarrhea. °· You have pain when you urinate or have a bowel movement. °· You experience abdominal pain that wakes you in the night. °· You have abdominal pain that is worsened or improved by eating food. °· You have abdominal pain that is worsened with eating fatty foods. °· You have a fever. °SEEK IMMEDIATE MEDICAL CARE IF:  °· Your pain does not go away within 2 hours. °· You keep throwing up (vomiting). °· Your pain is felt only in portions of the abdomen, such as the right side or the left lower portion of the abdomen. °· You pass bloody or black tarry stools. °MAKE SURE YOU: °· Understand these instructions.   °· Will watch your condition.   °· Will get help right away if you are not doing well or get worse.   °Document Released: 02/02/2005 Document Revised: 04/30/2013 Document Reviewed: 01/02/2013 °ExitCare® Patient Information ©2015 [employer]. This information is not intended to replace advice given to you by your health care provider. Make sure you discuss any questions you have with your health care provider. ° °

## 2015-02-02 ENCOUNTER — Encounter (HOSPITAL_COMMUNITY): Payer: Self-pay

## 2015-02-02 ENCOUNTER — Inpatient Hospital Stay (HOSPITAL_COMMUNITY)
Admission: EM | Admit: 2015-02-02 | Discharge: 2015-02-04 | DRG: 438 | Disposition: A | Discharge status: Home / Self Care | Payer: Self-pay | Attending: Internal Medicine | Admitting: Internal Medicine

## 2015-02-02 DIAGNOSIS — K219 Gastro-esophageal reflux disease without esophagitis: Secondary | ICD-10-CM | POA: Diagnosis present

## 2015-02-02 DIAGNOSIS — F17210 Nicotine dependence, cigarettes, uncomplicated: Secondary | ICD-10-CM | POA: Diagnosis present

## 2015-02-02 DIAGNOSIS — D6959 Other secondary thrombocytopenia: Secondary | ICD-10-CM | POA: Diagnosis present

## 2015-02-02 DIAGNOSIS — F17200 Nicotine dependence, unspecified, uncomplicated: Secondary | ICD-10-CM | POA: Diagnosis present

## 2015-02-02 DIAGNOSIS — E43 Unspecified severe protein-calorie malnutrition: Secondary | ICD-10-CM | POA: Diagnosis present

## 2015-02-02 DIAGNOSIS — F1029 Alcohol dependence with unspecified alcohol-induced disorder: Secondary | ICD-10-CM

## 2015-02-02 DIAGNOSIS — Z8249 Family history of ischemic heart disease and other diseases of the circulatory system: Secondary | ICD-10-CM

## 2015-02-02 DIAGNOSIS — F10288 Alcohol dependence with other alcohol-induced disorder: Secondary | ICD-10-CM | POA: Diagnosis present

## 2015-02-02 DIAGNOSIS — K859 Acute pancreatitis without necrosis or infection, unspecified: Secondary | ICD-10-CM | POA: Diagnosis present

## 2015-02-02 DIAGNOSIS — D649 Anemia, unspecified: Secondary | ICD-10-CM | POA: Diagnosis present

## 2015-02-02 DIAGNOSIS — K852 Alcohol induced acute pancreatitis without necrosis or infection: Secondary | ICD-10-CM | POA: Diagnosis present

## 2015-02-02 DIAGNOSIS — Z720 Tobacco use: Secondary | ICD-10-CM | POA: Diagnosis present

## 2015-02-02 DIAGNOSIS — F419 Anxiety disorder, unspecified: Secondary | ICD-10-CM | POA: Diagnosis present

## 2015-02-02 DIAGNOSIS — F1020 Alcohol dependence, uncomplicated: Secondary | ICD-10-CM | POA: Diagnosis present

## 2015-02-02 DIAGNOSIS — Z681 Body mass index (BMI) 19 or less, adult: Secondary | ICD-10-CM

## 2015-02-02 DIAGNOSIS — K860 Alcohol-induced chronic pancreatitis: Secondary | ICD-10-CM | POA: Insufficient documentation

## 2015-02-02 DIAGNOSIS — F332 Major depressive disorder, recurrent severe without psychotic features: Secondary | ICD-10-CM | POA: Diagnosis present

## 2015-02-02 DIAGNOSIS — I10 Essential (primary) hypertension: Secondary | ICD-10-CM | POA: Diagnosis present

## 2015-02-02 LAB — COMPREHENSIVE METABOLIC PANEL
ALBUMIN: 3.8 g/dL (ref 3.5–5.0)
ALT: 43 U/L (ref 17–63)
AST: 45 U/L — AB (ref 15–41)
Alkaline Phosphatase: 121 U/L (ref 38–126)
Anion gap: 8 (ref 5–15)
BILIRUBIN TOTAL: 0.8 mg/dL (ref 0.3–1.2)
BUN: 10 mg/dL (ref 6–20)
CO2: 23 mmol/L (ref 22–32)
CREATININE: 0.97 mg/dL (ref 0.61–1.24)
Calcium: 8.7 mg/dL — ABNORMAL LOW (ref 8.9–10.3)
Chloride: 110 mmol/L (ref 101–111)
GFR calc Af Amer: >60 mL/min (ref >60)
GLUCOSE: 93 mg/dL (ref 65–99)
POTASSIUM: 4.6 mmol/L (ref 3.5–5.1)
Sodium: 141 mmol/L (ref 135–145)
TOTAL PROTEIN: 7.7 g/dL (ref 6.5–8.1)

## 2015-02-02 LAB — URINALYSIS, ROUTINE W REFLEX MICROSCOPIC
BILIRUBIN URINE: NEGATIVE
Glucose, UA: NEGATIVE mg/dL
HGB URINE DIPSTICK: NEGATIVE
KETONES UR: NEGATIVE mg/dL
Leukocytes, UA: NEGATIVE
Nitrite: NEGATIVE
PH: 5 (ref 5.0–8.0)
Protein, ur: NEGATIVE mg/dL
SPECIFIC GRAVITY, URINE: 1.019 (ref 1.005–1.030)
UROBILINOGEN UA: 0.2 mg/dL (ref 0.0–1.0)

## 2015-02-02 LAB — CBC
HEMATOCRIT: 40.1 % (ref 39.0–52.0)
Hemoglobin: 13.3 g/dL (ref 13.0–17.0)
MCH: 31.6 pg (ref 26.0–34.0)
MCHC: 33.2 g/dL (ref 30.0–36.0)
MCV: 95.2 fL (ref 78.0–100.0)
PLATELETS: 180 10*3/uL (ref 150–400)
RBC: 4.21 MIL/uL — ABNORMAL LOW (ref 4.22–5.81)
RDW: 14.3 % (ref 11.5–15.5)
WBC: 11.9 10*3/uL — ABNORMAL HIGH (ref 4.0–10.5)

## 2015-02-02 LAB — LIPASE, BLOOD: Lipase: 193 U/L — ABNORMAL HIGH (ref 22–51)

## 2015-02-02 MED ORDER — NICOTINE 21 MG/24HR TD PT24
21.0000 mg | MEDICATED_PATCH | Freq: Every day | TRANSDERMAL | Status: DC
  Administered 2015-02-02 – 2015-02-04 (×3): 21 mg via TRANSDERMAL
  Filled 2015-02-02 (×3): qty 1

## 2015-02-02 MED ORDER — PANCRELIPASE (LIP-PROT-AMYL) 12000-38000 UNITS PO CPEP
24000.0000 [IU] | ORAL_CAPSULE | Freq: Three times a day (TID) | ORAL | Status: DC
  Administered 2015-02-02 – 2015-02-04 (×6): 24000 [IU] via ORAL
  Filled 2015-02-02 (×8): qty 2

## 2015-02-02 MED ORDER — LORAZEPAM 2 MG/ML IJ SOLN
1.0000 mg | Freq: Four times a day (QID) | INTRAMUSCULAR | Status: DC | PRN
  Administered 2015-02-02 – 2015-02-04 (×4): 1 mg via INTRAVENOUS
  Filled 2015-02-02 (×4): qty 1

## 2015-02-02 MED ORDER — SODIUM CHLORIDE 0.9 % IV SOLN
INTRAVENOUS | Status: DC
  Administered 2015-02-02 – 2015-02-03 (×3): via INTRAVENOUS

## 2015-02-02 MED ORDER — ONDANSETRON HCL 4 MG/2ML IJ SOLN
4.0000 mg | Freq: Once | INTRAMUSCULAR | Status: AC
  Administered 2015-02-02: 4 mg via INTRAVENOUS
  Filled 2015-02-02: qty 2

## 2015-02-02 MED ORDER — HYDROMORPHONE HCL 1 MG/ML IJ SOLN
1.0000 mg | INTRAMUSCULAR | Status: DC | PRN
  Administered 2015-02-02 – 2015-02-03 (×10): 1 mg via INTRAVENOUS
  Filled 2015-02-02 (×10): qty 1

## 2015-02-02 MED ORDER — FOLIC ACID 1 MG PO TABS
1.0000 mg | ORAL_TABLET | Freq: Every day | ORAL | Status: DC
  Administered 2015-02-02 – 2015-02-04 (×3): 1 mg via ORAL
  Filled 2015-02-02 (×3): qty 1

## 2015-02-02 MED ORDER — ONDANSETRON HCL 4 MG/2ML IJ SOLN
4.0000 mg | Freq: Four times a day (QID) | INTRAMUSCULAR | Status: DC | PRN
  Administered 2015-02-03: 4 mg via INTRAVENOUS
  Filled 2015-02-02: qty 2

## 2015-02-02 MED ORDER — LORAZEPAM 1 MG PO TABS
1.0000 mg | ORAL_TABLET | Freq: Four times a day (QID) | ORAL | Status: DC | PRN
  Administered 2015-02-04: 1 mg via ORAL
  Filled 2015-02-02: qty 1

## 2015-02-02 MED ORDER — QUETIAPINE FUMARATE 100 MG PO TABS
100.0000 mg | ORAL_TABLET | Freq: Every day | ORAL | Status: DC
  Administered 2015-02-02 – 2015-02-03 (×2): 100 mg via ORAL
  Filled 2015-02-02 (×3): qty 1

## 2015-02-02 MED ORDER — METOPROLOL TARTRATE 25 MG PO TABS
25.0000 mg | ORAL_TABLET | Freq: Two times a day (BID) | ORAL | Status: DC
  Administered 2015-02-02 – 2015-02-04 (×4): 25 mg via ORAL
  Filled 2015-02-02 (×5): qty 1

## 2015-02-02 MED ORDER — HYDROMORPHONE HCL 1 MG/ML IJ SOLN
1.0000 mg | Freq: Once | INTRAMUSCULAR | Status: AC
  Administered 2015-02-02: 1 mg via INTRAVENOUS
  Filled 2015-02-02: qty 1

## 2015-02-02 MED ORDER — INFLUENZA VAC SPLIT QUAD 0.5 ML IM SUSY
0.5000 mL | PREFILLED_SYRINGE | INTRAMUSCULAR | Status: AC
  Administered 2015-02-03: 0.5 mL via INTRAMUSCULAR
  Filled 2015-02-02 (×2): qty 0.5

## 2015-02-02 MED ORDER — CHLORHEXIDINE GLUCONATE 0.12 % MT SOLN
15.0000 mL | Freq: Two times a day (BID) | OROMUCOSAL | Status: DC
  Administered 2015-02-02 – 2015-02-04 (×4): 15 mL via OROMUCOSAL
  Filled 2015-02-02 (×6): qty 15

## 2015-02-02 MED ORDER — SERTRALINE HCL 50 MG PO TABS
75.0000 mg | ORAL_TABLET | Freq: Every day | ORAL | Status: DC
  Administered 2015-02-02 – 2015-02-04 (×3): 75 mg via ORAL
  Filled 2015-02-02 (×3): qty 1

## 2015-02-02 MED ORDER — OXYCODONE HCL 5 MG PO TABS
5.0000 mg | ORAL_TABLET | ORAL | Status: DC | PRN
  Administered 2015-02-03 – 2015-02-04 (×5): 5 mg via ORAL
  Filled 2015-02-02 (×5): qty 1

## 2015-02-02 MED ORDER — ENOXAPARIN SODIUM 40 MG/0.4ML ~~LOC~~ SOLN
40.0000 mg | SUBCUTANEOUS | Status: DC
  Administered 2015-02-02 – 2015-02-03 (×2): 40 mg via SUBCUTANEOUS
  Filled 2015-02-02 (×3): qty 0.4

## 2015-02-02 MED ORDER — ONDANSETRON HCL 4 MG PO TABS: 4.0000 mg | ORAL_TABLET | Freq: Four times a day (QID) | ORAL | Status: DC | PRN

## 2015-02-02 MED ORDER — FAMOTIDINE IN NACL 20-0.9 MG/50ML-% IV SOLN
20.0000 mg | Freq: Once | INTRAVENOUS | Status: AC
  Administered 2015-02-02: 20 mg via INTRAVENOUS
  Filled 2015-02-02: qty 50

## 2015-02-02 MED ORDER — SODIUM CHLORIDE 0.9 % IV SOLN
1000.0000 mL | INTRAVENOUS | Status: DC
  Administered 2015-02-02: 1000 mL via INTRAVENOUS

## 2015-02-02 MED ORDER — ADULT MULTIVITAMIN W/MINERALS CH
1.0000 | ORAL_TABLET | Freq: Every day | ORAL | Status: DC
  Administered 2015-02-02 – 2015-02-04 (×3): 1 via ORAL
  Filled 2015-02-02 (×3): qty 1

## 2015-02-02 MED ORDER — PANTOPRAZOLE SODIUM 20 MG PO TBEC
20.0000 mg | DELAYED_RELEASE_TABLET | Freq: Every day | ORAL | Status: DC
  Administered 2015-02-02 – 2015-02-04 (×3): 20 mg via ORAL
  Filled 2015-02-02 (×3): qty 1

## 2015-02-02 MED ORDER — ALBUTEROL SULFATE (2.5 MG/3ML) 0.083% IN NEBU: 2.5000 mg | INHALATION_SOLUTION | Freq: Four times a day (QID) | RESPIRATORY_TRACT | Status: DC | PRN

## 2015-02-02 MED ORDER — VITAMIN B-1 100 MG PO TABS
100.0000 mg | ORAL_TABLET | Freq: Every day | ORAL | Status: DC
  Administered 2015-02-02 – 2015-02-04 (×3): 100 mg via ORAL
  Filled 2015-02-02 (×3): qty 1

## 2015-02-02 MED ORDER — GI COCKTAIL ~~LOC~~
30.0000 mL | Freq: Once | ORAL | Status: AC
  Administered 2015-02-02: 30 mL via ORAL
  Filled 2015-02-02: qty 30

## 2015-02-02 MED ORDER — THIAMINE HCL 100 MG/ML IJ SOLN
100.0000 mg | Freq: Every day | INTRAMUSCULAR | Status: DC
  Filled 2015-02-02 (×3): qty 1

## 2015-02-02 MED ORDER — LOPERAMIDE HCL 2 MG PO CAPS: 2.0000 mg | ORAL_CAPSULE | ORAL | Status: DC | PRN

## 2015-02-02 NOTE — Progress Notes (Signed)
New Admission Note:  Arrival Method: stretcher Mental Orientation: Alert and oriented x4 Telemetry: none Assessment: Completed Skin: dry and intact IV: Left hand, clean, dry, intact, saline locked Pain: pt reports pain 7 out of 10 in left abdomen, medication given Safety Measures: Safety Fall Prevention Plan was given, discussed and signed. Admission: Completed [facility] Orientation: Patient has been orientated to the room, unit and the staff. Family:  Orders have been reviewed and implemented. Will continue to monitor the patient. Call light has been placed within reach and bed alarm has been activated.   Alphia Kava, BSN, RN  Phone Number: [phone]

## 2015-02-02 NOTE — ED Notes (Signed)
Attempted to call report, RN will call back 5 min

## 2015-02-02 NOTE — Progress Notes (Signed)
Pt completed office visit with Dr Doreene Burke of [facility] on 01/15/15 see EPIC notes

## 2015-02-02 NOTE — H&P (Addendum)
[facility] History and Physical  [name] DZH:[id number] DOB: 06-29-1970 DOA: 02/02/2015  Referring physician: ED physician, Dr. Tomi Moran  PCP: Jason Kanner, NP   Chief Complaint: abd pain   HPI:  Pt is 44 yo male with known alcohol and abuse presented, chronic pancreatitis, presented to [facility] ED with main concern of several days duration of progressively worsening abd pain worse in the left upper quadrant, initially intermittent and throbbing, currently constant and sharp, 10/10 in severity, occasionally but not consistently radiating to right upper quadrant and epigastric area, associated with nausea and poor oral intake, one episode of nonbloody vomiting. Patient reports similar events in the past due to pancreatitis. He reports his last drink was one day prior to this admission. Patient denies fevers and chills, no chest pain and no shortness of breath.  In emergency department, patient noted to be hemodynamically stable, VS as, blood work notable for lipase 193. TRH asked to admit to medical unit for management of acute pancreatitis.  Assessment and Plan:  Principal Problem:   Alcohol-induced acute on chronic pancreatitis - Admit to medical bed - Start with providing supportive care, IV fluids, antiemetics as needed - Keep nothing by mouth for now - Repeat lipase level in the morning - Plan to advance diet to clear liquids within next 24 hours  Active Problems:   Alcohol dependence - no signs of withdrawal at this time - keep on CIWA protocol     TOBACCO ABUSE - cessation discussed in detail - provide nicotine patch     Alcohol induced transaminitis  - CMET in AM    Protein-calorie malnutrition, severe - nutritionist consulted    MDD (major depressive disorder), recurrent severe, without psychosis - stable at this time  - has has previous suicidal attempts and OD on alcohol - UDS requested    Essential hypertension - continue home medical regimen  DVT  prophylaxis - Lovenox SQ  Radiological Exams on Admission: No results found.   Code Status: Full Family Communication: Pt at bedside Disposition Plan: Admit for further evaluation    [facility] [phone]   Review of Systems:  Constitutional: Negative for fever, chills. Negative for diaphoresis.  HENT: Negative for hearing loss, ear pain, nosebleeds, congestion, sore throat, neck pain, tinnitus and ear discharge.   Eyes: Negative for blurred vision, double vision, photophobia, pain, discharge and redness.  Respiratory: Negative for cough, hemoptysis, sputum production, shortness of breath, wheezing and stridor.   Cardiovascular: Negative for chest pain, palpitations, orthopnea, claudication and leg swelling.  Gastrointestinal: per HPI Genitourinary: Negative for dysuria, urgency, frequency, hematuria and flank pain.  Musculoskeletal: Negative for myalgias, back pain, joint pain and falls.  Skin: Negative for itching and rash.  Neurological: Negative for dizziness and weakness.  Endo/Heme/Allergies: Negative for environmental allergies and polydipsia. Does not bruise/bleed easily.  Psychiatric/Behavioral: Negative for suicidal ideas. The patient is not nervous/anxious.      Past Medical History  Diagnosis Date  . Hypertension   . Asthma   . Pancreatitis   . Cocaine abuse   . Depression   . H/O suicide attempt   . Heart murmur     "when he was little" (03/06/2013)  . Shortness of breath     "can happen at anytime" (03/06/2013)  . Anemia   . H/O hiatal hernia   . GERD (gastroesophageal reflux disease)   . Anxiety   . WPW (Wolff-Parkinson-White syndrome)     [name] 03/06/2013  . High cholesterol   .  Migraine     "monthly" (01/30/2014)  . Arthritis     "knees; arms; elbows" (01/30/2014)  . Femoral condyle fracture 03/08/2014    left medial/notes 03/09/2014  . Alcoholism /alcohol abuse     Past Surgical History  Procedure Laterality Date  . Facial fracture  surgery Left 1990's    "result of trauma"   . Eye surgery Left 1990's    "result of trauma"   . Left heart catheterization with coronary angiogram Right 03/07/2013    Procedure: LEFT HEART CATHETERIZATION WITH CORONARY ANGIOGRAM;  Surgeon: [name], MD;  Location: [facility] CATH [facility];  Service: Cardiovascular;  Laterality: Right;    Social History:  reports that he has been smoking Cigarettes.  He has a 15 pack-year smoking history. His smokeless tobacco use includes Chew. He reports that he drinks about 3.6 oz of alcohol per week. He reports that he uses illicit drugs (Marijuana).  Allergies  Allergen Reactions  . Shellfish-Derived Products Nausea And Vomiting  . Trazodone And Nefazodone Other (See Comments)    Muscle spasms    Family History  Problem Relation Age of Onset  . Hypertension Other   . Coronary artery disease Other     Medication Sig  acetaminophen (TYLENOL) 500 MG tablet Take 1,000 mg by mouth every 6 (six) hours as needed for moderate pain.  albuterol (PROVENTIL HFA;VENTOLIN HFA) 108 (90 BASE) MCG/ACT inhaler Inhale 2 puffs into the lungs every 6 (six) hours as needed for wheezing or shortness of breath (wheezing).   Alum & Mag Hydroxide-Simeth (GI COCKTAIL) SUSP suspension Take 30 mLs by mouth 3 (three) times daily. Shake well.  folic acid (FOLVITE) 1 MG tablet Take 1 tablet (1 mg total) by mouth daily.  lipase/protease/amylase (CREON) 12000 UNITS CPEP capsule Take 2 capsules (24,000 Units total) by mouth 3 (three) times daily with meals.  loperamide (IMODIUM) 2 MG capsule Take 1-2 capsules (2-4 mg total) by mouth as needed for diarrhea or loose stools.  metoprolol tartrate (LOPRESSOR) 25 MG tablet Take 1 tablet (25 mg total) by mouth 2 (two) times daily.  Multiple Vitamin (MULTIVITAMIN) capsule Take 1 capsule by mouth daily.  ondansetron (ZOFRAN) 8 MG tablet Take 1 tablet (8 mg total) by mouth every 8 (eight) hours as needed for nausea.  pantoprazole (PROTONIX) 20 MG  tablet Take 1 tablet (20 mg total) by mouth daily.  QUEtiapine (SEROQUEL) 100 MG tablet Take 1 tablet (100 mg total) by mouth at bedtime. For mood control  sertraline (ZOLOFT) 25 MG tablet Take 3 tablets (75 mg total) by mouth daily.  sildenafil (VIAGRA) 50 MG tablet Take 1 tablet (50 mg total) by mouth daily as needed for erectile dysfunction.  traMADol (ULTRAM) 50 MG tablet Take 1 tablet (50 mg total) by mouth every 8 (eight) hours as needed.  omeprazole (PRILOSEC) 20 MG capsule Take 1 capsule (20 mg total) by mouth daily. Patient not taking: Reported on 02/02/2015    Physical Exam: Filed Vitals:   02/02/15 0758  BP: 136/93  Pulse: 86  Temp: 98.2 F (36.8 C)  TempSrc: Oral  Resp: 20  SpO2: 99%    Physical Exam  Constitutional: Appears well-developed and well-nourished. No distress.  HENT: Normocephalic. External right and left ear normal. Dry MM Eyes: Conjunctivae and EOM are normal. PERRLA, no scleral icterus.  Neck: Normal ROM. Neck supple. No JVD. No tracheal deviation. No thyromegaly.  CVS: RRR, S1/S2 +, no murmurs, no gallops, no carotid bruit.  Pulmonary: Effort and breath sounds normal,  no stridor, rhonchi, wheezes, rales.  Abdominal: Soft. BS +,  no distension, tenderness in upper abd quadrants, no rebound or guarding.  Musculoskeletal: Normal range of motion. No edema and no tenderness.  Lymphadenopathy: No lymphadenopathy noted, cervical, inguinal. Neuro: Alert. Normal reflexes, muscle tone coordination. No cranial nerve deficit. Skin: Skin is warm and dry. No rash noted. Not diaphoretic. No erythema. No pallor.  Psychiatric: Normal mood and affect. Behavior, judgment, thought content normal.   Labs on Admission:  Basic Metabolic Panel:  Recent Labs Lab 02/02/15 0840  NA 141  K 4.6  CL 110  CO2 23  GLUCOSE 93  BUN 10  CREATININE 0.97  CALCIUM 8.7*   Liver Function Tests:  Recent Labs Lab 02/02/15 0840  AST 45*  ALT 43  ALKPHOS 121  BILITOT 0.8   PROT 7.7  ALBUMIN 3.8    Recent Labs Lab 02/02/15 0840  LIPASE 193*   CBC:  Recent Labs Lab 02/02/15 0840  WBC 11.9*  HGB 13.3  HCT 40.1  MCV 95.2  PLT 180   EKG: pending  If 7PM-7AM, please contact night-coverage [email or web address] Password [facility] 02/02/2015, 12:44 PM

## 2015-02-02 NOTE — ED Provider Notes (Signed)
CSN: [id number]     Arrival date & time 02/02/15  0744 History   First MD Initiated Contact with Patient 02/02/15 0759     Chief Complaint  Patient presents with  . Abdominal Pain   HPI The patient has a history of recurrent pancreatitis. Yesterday he admits to drinking alcohol. I asked evening he started having recurrent abdominal pain similar to his previous pancreatitis attacks. Pain is in his upper abdomen and radiates to the back. The pain has progressed in severity. It is severe.  he has had nausea and vomiting. He has had some loose stools. He denies any trouble with diarrhea. No constipation. No dysuria. No fevers. Past Medical History  Diagnosis Date  . Hypertension   . Asthma   . Pancreatitis   . Cocaine abuse   . Depression   . H/O suicide attempt   . Heart murmur     "when he was little" (03/06/2013)  . Shortness of breath     "can happen at anytime" (03/06/2013)  . Anemia   . H/O hiatal hernia   . GERD (gastroesophageal reflux disease)   . Anxiety   . WPW (Wolff-Parkinson-White syndrome)     [name] 03/06/2013  . High cholesterol   . Migraine     "monthly" (01/30/2014)  . Arthritis     "knees; arms; elbows" (01/30/2014)  . Femoral condyle fracture 03/08/2014    left medial/notes 03/09/2014  . Alcoholism /alcohol abuse    Past Surgical History  Procedure Laterality Date  . Facial fracture surgery Left 1990's    "result of trauma"   . Eye surgery Left 1990's    "result of trauma"   . Left heart catheterization with coronary angiogram Right 03/07/2013    Procedure: LEFT HEART CATHETERIZATION WITH CORONARY ANGIOGRAM;  Surgeon: [name], MD;  Location: [place] [facility];  Service: Cardiovascular;  Laterality: Right;   Family History  Problem Relation Age of Onset  . Hypertension Other   . Coronary artery disease Other    Social History  Substance Use Topics  . Smoking status: Current Every Day Smoker -- 0.50 packs/day for 30 years    Types: Cigarettes  .  Smokeless tobacco: Current User    Types: Chew  . Alcohol Use: 3.6 oz/week    6 Cans of beer per week     Comment: Last drink Thursday 12/11/14 per patient    Review of Systems  All other systems reviewed and are negative.     Allergies  Shellfish-derived products and Trazodone and nefazodone  Home Medications   Prior to Admission medications   Medication Sig Start Date End Date Taking? Authorizing Nikaela Coyne  acetaminophen (TYLENOL) 500 MG tablet Take 1,000 mg by mouth every 6 (six) hours as needed for moderate pain.   Yes Historical Deon Ivey, MD  albuterol (PROVENTIL HFA;VENTOLIN HFA) 108 (90 BASE) MCG/ACT inhaler Inhale 2 puffs into the lungs every 6 (six) hours as needed for wheezing or shortness of breath (wheezing).    Yes Historical Savalas Monje, MD  Alum & Mag Hydroxide-Simeth (GI COCKTAIL) SUSP suspension Take 30 mLs by mouth 3 (three) times daily. Shake well. 01/15/15  Yes [name], MD  folic acid (FOLVITE) 1 MG tablet Take 1 tablet (1 mg total) by mouth daily. 01/15/15  Yes [name], MD  lipase/protease/amylase (CREON) 12000 UNITS CPEP capsule Take 2 capsules (24,000 Units total) by mouth 3 (three) times daily with meals. 01/15/15  Yes [name], MD  loperamide (IMODIUM) 2  MG capsule Take 1-2 capsules (2-4 mg total) by mouth as needed for diarrhea or loose stools. 01/23/14  Yes [name], PA-C  metoprolol tartrate (LOPRESSOR) 25 MG tablet Take 1 tablet (25 mg total) by mouth 2 (two) times daily. 01/15/15  Yes [name], MD  Multiple Vitamin (MULTIVITAMIN) capsule Take 1 capsule by mouth daily. 02/06/14  Yes [name], MD  ondansetron (ZOFRAN) 8 MG tablet Take 1 tablet (8 mg total) by mouth every 8 (eight) hours as needed for nausea. 01/15/15  Yes [name], MD  pantoprazole (PROTONIX) 20 MG tablet Take 1 tablet (20 mg total) by mouth daily. 01/15/15  Yes [name], MD  QUEtiapine (SEROQUEL) 100 MG tablet Take 1 tablet  (100 mg total) by mouth at bedtime. For mood control 01/15/15  Yes [name], MD  sertraline (ZOLOFT) 25 MG tablet Take 3 tablets (75 mg total) by mouth daily. 01/15/15  Yes [name], MD  sildenafil (VIAGRA) 50 MG tablet Take 1 tablet (50 mg total) by mouth daily as needed for erectile dysfunction. 01/15/15  Yes Olugbemiga Essie Christine, MD  traMADol (ULTRAM) 50 MG tablet Take 1 tablet (50 mg total) by mouth every 8 (eight) hours as needed. 01/15/15  Yes [name], MD  omeprazole (PRILOSEC) 20 MG capsule Take 1 capsule (20 mg total) by mouth daily. Patient not taking: Reported on 02/02/2015 08/26/14   [name], PA-C   BP 136/93 mmHg  Pulse 86  Temp(Src) 98.2 F (36.8 C) (Oral)  Resp 20  SpO2 99% Physical Exam  Constitutional: He appears well-developed and well-nourished. No distress.  HENT:  Head: Normocephalic and atraumatic.  Right Ear: External ear normal.  Left Ear: External ear normal.  Eyes: Conjunctivae are normal. Right eye exhibits no discharge. Left eye exhibits no discharge. No scleral icterus.  Neck: Neck supple. No tracheal deviation present.  Cardiovascular: Normal rate, regular rhythm and intact distal pulses.   Pulmonary/Chest: Effort normal and breath sounds normal. No stridor. No respiratory distress. He has no wheezes. He has no rales.  Abdominal: Soft. Bowel sounds are normal. He exhibits no distension. There is tenderness in the epigastric area. There is no rebound and no guarding.  Musculoskeletal: He exhibits no edema or tenderness.  Neurological: He is alert. He has normal strength. No cranial nerve deficit (no facial droop, extraocular movements intact, no slurred speech) or sensory deficit. He exhibits normal muscle tone. He displays no seizure activity. Coordination normal.  Skin: Skin is warm and dry. No rash noted.  Psychiatric: He has a normal mood and affect.  Nursing note and vitals reviewed.   ED Course  Procedures (including  critical care time) Labs Review Labs Reviewed  LIPASE, BLOOD - Abnormal; Notable for the following:    Lipase 193 (*)    All other components within normal limits  COMPREHENSIVE METABOLIC PANEL - Abnormal; Notable for the following:    Calcium 8.7 (*)    AST 45 (*)    All other components within normal limits  CBC - Abnormal; Notable for the following:    WBC 11.9 (*)    RBC 4.21 (*)    All other components within normal limits  URINALYSIS, ROUTINE W REFLEX MICROSCOPIC (NOT AT [facility])    I have personally reviewed and evaluated these  lab results as part of my medical decision-making.   Medications  0.9 %  sodium chloride infusion (0 mLs Intravenous Stopped 02/02/15 1109)  ondansetron (ZOFRAN) injection  4 mg (4 mg Intravenous Given 02/02/15 0830)  HYDROmorphone (DILAUDID) injection 1 mg (1 mg Intravenous Given 02/02/15 0831)  famotidine (PEPCID) IVPB 20 mg premix (0 mg Intravenous Stopped 02/02/15 1109)  HYDROmorphone (DILAUDID) injection 1 mg (1 mg Intravenous Given 02/02/15 1109)    MDM   Final diagnoses:  Alcohol-induced acute pancreatitis    The patient is having persistent pain in his upper abdomen that is consistent with his recurrent alcohol-induced pancreatitis. Patient has been given multiple pain medications but he continues to have pain. Nausea medications have helped in that he has not had any vomiting here in the emergency department but continues to feel nauseated. Patient has not been able to keep down any fluids or food. I stressed to the patient the importance of avoiding alcohol in the future. He does know that this causes his recurrent bouts of pancreatitis. I will consult the medical service regarding admission for symptomatically treatment of his pancreatitis.    Dorie Rank, MD 02/02/15 [phone]

## 2015-02-02 NOTE — ED Notes (Signed)
Per EMS, pt from home.  Pt c/o abdominal pain x 2 days.  Feels like his normal "pancreatitis flare up".  Guarding left side abdomen.  Nausea/vomiting.   4mg  zofran IV in route.  20g LAC.  175 mcg fentanyl given. 136/98, hr 94, NSR, cbg 123, 100% ra

## 2015-02-02 NOTE — ED Notes (Signed)
Bed: PQ24 Expected date:  Expected time:  Means of arrival:  Comments: EMS- 44yo M, abdominal pain/pancreatitis

## 2015-02-03 DIAGNOSIS — K859 Acute pancreatitis without necrosis or infection, unspecified: Secondary | ICD-10-CM | POA: Diagnosis present

## 2015-02-03 DIAGNOSIS — F1020 Alcohol dependence, uncomplicated: Secondary | ICD-10-CM

## 2015-02-03 LAB — COMPREHENSIVE METABOLIC PANEL
ALBUMIN: 3.5 g/dL (ref 3.5–5.0)
ALK PHOS: 107 U/L (ref 38–126)
ALT: 30 U/L (ref 17–63)
ANION GAP: 4 — AB (ref 5–15)
AST: 31 U/L (ref 15–41)
BUN: <5 mg/dL — ABNORMAL LOW (ref 6–20)
CO2: 25 mmol/L (ref 22–32)
Calcium: 8.3 mg/dL — ABNORMAL LOW (ref 8.9–10.3)
Chloride: 109 mmol/L (ref 101–111)
Creatinine, Ser: 0.58 mg/dL — ABNORMAL LOW (ref 0.61–1.24)
GFR calc Af Amer: >60 mL/min (ref >60)
GFR calc non Af Amer: >60 mL/min (ref >60)
GLUCOSE: 98 mg/dL (ref 65–99)
POTASSIUM: 3.5 mmol/L (ref 3.5–5.1)
SODIUM: 138 mmol/L (ref 135–145)
Total Bilirubin: 0.9 mg/dL (ref 0.3–1.2)
Total Protein: 6.8 g/dL (ref 6.5–8.1)

## 2015-02-03 LAB — RAPID URINE DRUG SCREEN, HOSP PERFORMED
AMPHETAMINES: NOT DETECTED
BARBITURATES: NOT DETECTED
BENZODIAZEPINES: NOT DETECTED
Cocaine: NOT DETECTED
Opiates: POSITIVE — AB
Tetrahydrocannabinol: NOT DETECTED

## 2015-02-03 LAB — CBC
HEMATOCRIT: 35.2 % — AB (ref 39.0–52.0)
HEMOGLOBIN: 11.6 g/dL — AB (ref 13.0–17.0)
MCH: 31.5 pg (ref 26.0–34.0)
MCHC: 33 g/dL (ref 30.0–36.0)
MCV: 95.7 fL (ref 78.0–100.0)
Platelets: 153 10*3/uL (ref 150–400)
RBC: 3.68 MIL/uL — ABNORMAL LOW (ref 4.22–5.81)
RDW: 14.6 % (ref 11.5–15.5)
WBC: 10.3 10*3/uL (ref 4.0–10.5)

## 2015-02-03 LAB — URINE CULTURE

## 2015-02-03 LAB — LIPASE, BLOOD: Lipase: 109 U/L — ABNORMAL HIGH (ref 22–51)

## 2015-02-03 MED ORDER — HYDROMORPHONE HCL 1 MG/ML IJ SOLN
1.0000 mg | INTRAMUSCULAR | Status: DC | PRN
  Administered 2015-02-03 – 2015-02-04 (×5): 1 mg via INTRAVENOUS
  Filled 2015-02-03 (×5): qty 1

## 2015-02-03 MED ORDER — ACETAMINOPHEN 325 MG PO TABS: 650.0000 mg | ORAL_TABLET | Freq: Four times a day (QID) | ORAL | Status: DC | PRN

## 2015-02-03 MED ORDER — POTASSIUM CHLORIDE IN NACL 40-0.9 MEQ/L-% IV SOLN
INTRAVENOUS | Status: DC
  Administered 2015-02-03: 100 mL/h via INTRAVENOUS
  Filled 2015-02-03 (×2): qty 1000

## 2015-02-03 NOTE — Progress Notes (Signed)
PROGRESS NOTE    [name] FAO:[id number] DOB: 05-May-1971 DOA: 02/02/2015 PCP: Angelica Chessman, MD  HPI/Brief narrative 44 year old male with history of alcohol dependence, tobacco abuse, chronic pancreatitis, HTN, asthma, cocaine abuse, depression & anxiety, anemia, GERD, HLD presented to the [facility] on 02/02/15 with several days' history of worsening abdominal pain, nonbloody emesis and diarrhea. His last drink of alcohol was on day prior to admission. Admitting lipase was 193. Patient was admitted for management of acute pancreatitis.   Assessment/Plan:  Alcohol-induced acute on chronic pancreatitis - Admitted to medical bed and treated supportively with bowel rest, IV fluids and pain management. - Clinically improving. Trial of clear liquid diet. Advance diet as tolerated. - Lipase down from 193 to 109 - Continue pancreatic supplements.  Alcohol dependence - placed on CIWA protocol. No overt withdrawal  Tobacco Abuse - cessation counseled. Continue nicotine patch.  Polysubstance abuse - Alcohol, tobacco. THC positive by UDS on 12/17/14. Cessation counseled and warned patient regarding dangerous and even life-threatening consequences to life if he continues to abuse cocaine while he is on current antihypertensives.  - Check UDS. If positive for cocaine, consider switching to carvedilol.  Essential hypertension  - Controlled. Continue metoprolol.   GERD  - PPI    Depression and anxiety  - Stable. Denies suicidal or homicidal ideations. No delusions or hallucinations. - Continue sertraline and Seroquel. Check EKG to monitor QTC.  Severe protein calorie malnutrition  - Per dietitian.   Anemia  - May be secondary to alcohol toxic effects versus dilutional. Follow CBC in a.m. No bleeding reported.      DVT prophylaxis: Lovenox  Code Status: Full Family Communication: None at bedside  Disposition Plan: DC home in the next 2-3 days.     Consultants:  None   Procedures:  None   Antibiotics:  None   Subjective: Had some (2-3 episodes) of nonbloody emesis and diarrhea overnight. None this morning. Willing to try liquid diet. Abdominal pain better.   Objective: Filed Vitals:   02/02/15 2117 02/03/15 0512 02/03/15 1412 02/03/15 1448  BP: 149/82 124/73  127/95  Pulse: 73 91  73  Temp: 98.5 F (36.9 C) 98 F (36.7 C)  98.3 F (36.8 C)  TempSrc: Oral Oral  Oral  Resp: 18 18  18   Height:   5\' 9"  (1.753 m)   Weight:   60.419 kg (133 lb 3.2 oz)   SpO2: 99% 100%  97%    Intake/Output Summary (Last 24 hours) at 02/03/15 1524 Last data filed at 02/03/15 0631  Gross per 24 hour  Intake 1501.67 ml  Output    750 ml  Net 751.67 ml   Filed Weights   02/03/15 1412  Weight: 60.419 kg (133 lb 3.2 oz)     Exam:  General exam: moderately built and nourished pleasant young male lying comfortably supine in bed.  Respiratory system: Clear. No increased work of breathing. Cardiovascular system: S1 & S2 heard, RRR. No JVD, murmurs, gallops, clicks or pedal edema. Gastrointestinal system: Abdomen is nondistended, soft. Mild epigastric tenderness without rigidity, guarding or rebound. Normal bowel sounds heard. Central nervous system: Alert and oriented. No focal neurological deficits. Extremities: Symmetric 5 x 5 power. Psychiatry: Pleasant and cooperative.    Data Reviewed: Basic Metabolic Panel:  Recent Labs Lab 02/02/15 0840 02/03/15 0608  NA 141 138  K 4.6 3.5  CL 110 109  CO2 23 25  GLUCOSE 93 98  BUN 10 <5*  CREATININE 0.97  0.58*  CALCIUM 8.7* 8.3*   Liver Function Tests:  Recent Labs Lab 02/02/15 0840 02/03/15 0608  AST 45* 31  ALT 43 30  ALKPHOS 121 107  BILITOT 0.8 0.9  PROT 7.7 6.8  ALBUMIN 3.8 3.5    Recent Labs Lab 02/02/15 0840 02/03/15 0608  LIPASE 193* 109*   No results for input(s): AMMONIA in the last 168 hours. CBC:  Recent Labs Lab 02/02/15 0840  02/03/15 0608  WBC 11.9* 10.3  HGB 13.3 11.6*  HCT 40.1 35.2*  MCV 95.2 95.7  PLT 180 153   Cardiac Enzymes: No results for input(s): CKTOTAL, CKMB, CKMBINDEX, TROPONINI in the last 168 hours. BNP (last 3 results) No results for input(s): PROBNP in the last 8760 hours. CBG: No results for input(s): GLUCAP in the last 168 hours.  Recent Results (from the past 240 hour(s))  Urine culture     Status: None   Collection Time: 02/02/15 12:49 PM  Result Value Ref Range Status   Specimen Description URINE, RANDOM  Final   Special Requests NONE  Final   Culture   Final    2,000 COLONIES/mL INSIGNIFICANT GROWTH Performed at [facility]    Report Status 02/03/2015 FINAL  Final         Studies: No results found.      Scheduled Meds: . chlorhexidine  15 mL Mouth/Throat BID  . enoxaparin (LOVENOX) injection  40 mg Subcutaneous Q24H  . folic acid  1 mg Oral Daily  . lipase/protease/amylase  24,000 Units Oral TID WC  . metoprolol tartrate  25 mg Oral BID  . multivitamin with minerals  1 tablet Oral Daily  . nicotine  21 mg Transdermal Daily  . pantoprazole  20 mg Oral Daily  . QUEtiapine  100 mg Oral QHS  . sertraline  75 mg Oral Daily  . thiamine  100 mg Oral Daily   Or  . thiamine  100 mg Intravenous Daily   Continuous Infusions: . sodium chloride 100 mL/hr at 02/03/15 0631    Principal Problem:   Alcohol-induced acute pancreatitis Active Problems:   TOBACCO ABUSE   Protein-calorie malnutrition, severe   MDD (major depressive disorder), recurrent severe, without psychosis   Alcohol dependence   Essential hypertension   Acute pancreatitis    Time spent: 30 minutes.    Vernell Leep, MD, FACP, FHM. [facility] Pager [phone]  If 7PM-7AM, please contact night-coverage [email or web address] Password [email or web address] 02/03/2015, 3:24 PM    LOS: 0 days

## 2015-02-03 NOTE — Care Management Note (Signed)
Case Management Note  Patient Details  Name: GLYN GERADS MRN: [id number] Date of Birth: 1970/10/17  Subjective/Objective: 44 y/o m admitted w/etoh pancreatitis. From home.Follows @ [place].                   Action/Plan:d/c plan home.   Expected Discharge Date:   (unknown)               Expected Discharge Plan:  Home/Self Care  In-House Referral:     Discharge planning Services  CM Consult  Post Acute Care Choice:    Choice offered to:     DME Arranged:    DME Agency:     HH Arranged:    HH Agency:     Status of Service:  In process, will continue to follow  Medicare Important Message Given:    Date Medicare IM Given:    Medicare IM give by:    Date Additional Medicare IM Given:    Additional Medicare Important Message give by:     If discussed at [place] of Stay Meetings, dates discussed:    Additional Comments:  Dessa Phi, RN 02/03/2015, 1:18 PM

## 2015-02-03 NOTE — Plan of Care (Signed)
Problem: Phase II Progression Outcomes Goal: Progress activity as tolerated unless otherwise ordered Outcome: Progressing Pt frequently ambulates around the halls independently.

## 2015-02-04 DIAGNOSIS — F10220 Alcohol dependence with intoxication, uncomplicated: Secondary | ICD-10-CM

## 2015-02-04 DIAGNOSIS — K860 Alcohol-induced chronic pancreatitis: Secondary | ICD-10-CM | POA: Insufficient documentation

## 2015-02-04 DIAGNOSIS — I10 Essential (primary) hypertension: Secondary | ICD-10-CM

## 2015-02-04 DIAGNOSIS — F332 Major depressive disorder, recurrent severe without psychotic features: Secondary | ICD-10-CM

## 2015-02-04 DIAGNOSIS — K852 Alcohol induced acute pancreatitis: Principal | ICD-10-CM

## 2015-02-04 DIAGNOSIS — Z720 Tobacco use: Secondary | ICD-10-CM

## 2015-02-04 DIAGNOSIS — E43 Unspecified severe protein-calorie malnutrition: Secondary | ICD-10-CM

## 2015-02-04 LAB — CBC
HCT: 37.3 % — ABNORMAL LOW (ref 39.0–52.0)
Hemoglobin: 12 g/dL — ABNORMAL LOW (ref 13.0–17.0)
MCH: 30.9 pg (ref 26.0–34.0)
MCHC: 32.2 g/dL (ref 30.0–36.0)
MCV: 96.1 fL (ref 78.0–100.0)
PLATELETS: 145 10*3/uL — AB (ref 150–400)
RBC: 3.88 MIL/uL — ABNORMAL LOW (ref 4.22–5.81)
RDW: 14.4 % (ref 11.5–15.5)
WBC: 8.6 10*3/uL (ref 4.0–10.5)

## 2015-02-04 LAB — LIPASE, BLOOD: LIPASE: 62 U/L — AB (ref 22–51)

## 2015-02-04 NOTE — Progress Notes (Signed)
Initial Nutrition Assessment  DOCUMENTATION CODES:   Severe malnutrition in context of acute illness/injury  INTERVENTION:  - Will order Ensure Enlive po BID, each supplement provides 350 kcal and 20 grams of protein - RD will continue to monitor for needs  NUTRITION DIAGNOSIS:   Inadequate oral intake related to acute illness as evidenced by per patient/family report.  GOAL:   Patient will meet greater than or equal to 90% of their needs  MONITOR:   PO intake, Supplement acceptance, Weight trends, Labs, I & O's  REASON FOR ASSESSMENT:   Consult Assessment of nutrition requirement/status  ASSESSMENT:   44 yo male with known alcohol and abuse presented, chronic pancreatitis, presented to [facility] ED with main concern of several days duration of progressively worsening abd pain worse in the left upper quadrant, initially intermittent and throbbing, currently constant and sharp, 10/10 in severity, occasionally but not consistently radiating to right upper quadrant and epigastric area, associated with nausea and poor oral intake, one episode of nonbloody vomiting. Patient reports similar events in the past due to pancreatitis. He reports his last drink was one day prior to this admission. Patient denies fevers and chills, no chest pain and no shortness of breath.  Pt seen for consult. BMI indicates normal weight status. Pt states his diet was advanced shortly before RD visit and he is planning to order scrambled eggs, grits, sausage, milk, and 7Up for breakfast.   He states that PTA he had a "slack" appetite and that some days he would eat, some days he would not eat, and some days he would have something like chicken broth. He states that variance was due to abdominal pain associated with pancreatitis. He states pain continues at this time.  Pt reports that over the past 10 months he has lost ~21 lbs. Per chart review, pt has lost 14 lbs (9.5% body weight) in the past 1.5 months which is  significant for time frame. Pt seemed irritated/agitated/anxious during discussion so did not perform physical assessment during visit.  Not meeting needs PTA. Medications reviewed. Labs reviewed; BUN <5, creatinine low, Ca: 8.3 mg/dL, lipase: 62 units/L.   Diet Order:  DIET SOFT Room service appropriate?: Yes; Fluid consistency:: Thin  Skin:  Reviewed, no issues  Last BM:  9/26  Height:   Ht Readings from Last 1 Encounters:  02/03/15 5\' 9"  (1.753 m)    Weight:   Wt Readings from Last 1 Encounters:  02/03/15 133 lb 3.2 oz (60.419 kg)    Ideal Body Weight:  72.73 kg (kg)  BMI:  Body mass index is 19.66 kg/(m^2).  Estimated Nutritional Needs:   Kcal:  2542-7062  Protein:  75-85 grams  Fluid:  2.2 L/day  EDUCATION NEEDS:   No education needs identified at this time     Jarome Matin, RD, LDN Inpatient Clinical Dietitian Pager # [phone] After hours/weekend pager # [phone]

## 2015-02-04 NOTE — Care Management Note (Signed)
Case Management Note  Patient Details  Name: [name] MRN: [id number] Date of Birth: Mar 13, 1971  Subjective/Objective:                    Action/Plan:d/c home. No needs or orders.   Expected Discharge Date:   (unknown)               Expected Discharge Plan:  Home/Self Care  In-House Referral:     Discharge planning Services  CM Consult  Post Acute Care Choice:    Choice offered to:     DME Arranged:    DME Agency:     HH Arranged:    [place] Agency:     Status of Service:  Completed, signed off  Medicare Important Message Given:    Date Medicare IM Given:    Medicare IM give by:    Date Additional Medicare IM Given:    Additional Medicare Important Message give by:     If discussed at [place] of Stay Meetings, dates discussed:    Additional Comments:  Dessa Phi, RN 02/04/2015, 1:50 PM

## 2015-02-04 NOTE — Progress Notes (Signed)
Pt discharged. VSS. Discharge instructions given, pt refused to sign. Home meds retrieved from pharmacy and given to patient, pt refused to sign form noting that meds had been returned to him.  Pt pack of cigarettes and 2 cigarette cigars returned to pt.  Pt refusing IV NSL removal. Pt advised of risk in not removing IV, pt verbalized understanding. Pt upset with staff and MD regarding request for D/C pain medication prescription denied. Pt witnessed getting on elevate with personal items.

## 2015-02-04 NOTE — Discharge Summary (Signed)
Physician Discharge Summary  [name] MPN:[id number] DOB: April 25, 1971 DOA: 02/02/2015  PCP: Angelica Chessman, MD  Admit date: 02/02/2015 Discharge date: 02/04/2015  Time spent: 35 minutes  Recommendations for Outpatient Follow-up:  1. F/uw ith PCP in 1-2 wks  Discharge Condition: stable  Discharge Diagnoses:  Principal Problem:   Alcohol-induced acute pancreatitis Active Problems:   TOBACCO ABUSE   Protein-calorie malnutrition, severe   MDD (major depressive disorder), recurrent severe, without psychosis   Alcohol dependence   Essential hypertension   History of present illness:  44 year old male with history of alcohol dependence, tobacco abuse, chronic pancreatitis, HTN, asthma, cocaine abuse, depression & anxiety, anemia, GERD, HLD presented to the [facility] on 02/02/15 with several days' history of worsening abdominal pain, nonbloody emesis and diarrhea. His last drink of alcohol was on day prior to admission. Admitting lipase was 193. Patient was admitted for management of acute pancreatitis.  Hospital Course:  Alcohol-induced acute on chronic pancreatitis - Admitted to medical bed and treated supportively with bowel rest, IV fluids and pain management. - diet advanced to regular - Lipase down from 193 to 62 - Continue pancreatic supplements.  Anemia / thrombocytopenia - May be secondary to alcohol toxic effects versus dilutional.  No bleeding reported.    Alcohol dependence - placed on CIWA protocol. No overt withdrawal  Tobacco Abuse - cessation counseling given  Polysubstance abuse - Alcohol, tobacco. THC positive by UDS on 12/17/14. Cessation counseled and warned patient regarding dangerous and even life-threatening consequences to life if he continues to abuse cocaine while he is on current antihypertensives.  -  UDS + for Opiates only this time  Essential hypertension  - Controlled. Continue metoprolol.   GERD  - PPI   Depression and  anxiety  - Stable. Denies suicidal or homicidal ideations. No delusions or hallucinations. - Continue sertraline and Seroquel-  QTC stable on EKG today  Severe protein calorie malnutrition  - Per dietitian.    Discharge Exam: Filed Weights   02/03/15 1412  Weight: 60.419 kg (133 lb 3.2 oz)   Filed Vitals:   02/04/15 0533  BP: 125/74  Pulse: 81  Temp: 97.9 F (36.6 C)  Resp: 20    General: AAO x 3, no distress Cardiovascular: RRR, no murmurs  Respiratory: clear to auscultation bilaterally GI: soft, non-tender, non-distended, bowel sound positive  Discharge Instructions You were cared for by a hospitalist during your hospital stay. If you have any questions about your discharge medications or the care you received while you were in the hospital after you are discharged, you can call the unit and asked to speak with the hospitalist on call if the hospitalist that took care of you is not available. Once you are discharged, your primary care physician will handle any further medical issues. Please note that NO REFILLS for any discharge medications will be authorized once you are discharged, as it is imperative that you return to your primary care physician (or establish a relationship with a primary care physician if you do not have one) for your aftercare needs so that they can reassess your need for medications and monitor your lab values.     Medication List    TAKE these medications        acetaminophen 500 MG tablet  Commonly known as:  TYLENOL  Take 1,000 mg by mouth every 6 (six) hours as needed for moderate pain.     albuterol 108 (90 BASE) MCG/ACT inhaler  Commonly known as:  PROVENTIL HFA;VENTOLIN HFA  Inhale 2 puffs into the lungs every 6 (six) hours as needed for wheezing or shortness of breath (wheezing).     folic acid 1 MG tablet  Commonly known as:  FOLVITE  Take 1 tablet (1 mg total) by mouth daily.     gi cocktail Susp suspension  Take 30 mLs by mouth 3  (three) times daily. Shake well.     lipase/protease/amylase 12000 UNITS Cpep capsule  Commonly known as:  CREON  Take 2 capsules (24,000 Units total) by mouth 3 (three) times daily with meals.     loperamide 2 MG capsule  Commonly known as:  IMODIUM  Take 1-2 capsules (2-4 mg total) by mouth as needed for diarrhea or loose stools.     metoprolol tartrate 25 MG tablet  Commonly known as:  LOPRESSOR  Take 1 tablet (25 mg total) by mouth 2 (two) times daily.     multivitamin capsule  Take 1 capsule by mouth daily.     ondansetron 8 MG tablet  Commonly known as:  ZOFRAN  Take 1 tablet (8 mg total) by mouth every 8 (eight) hours as needed for nausea.     pantoprazole 20 MG tablet  Commonly known as:  PROTONIX  Take 1 tablet (20 mg total) by mouth daily.     QUEtiapine 100 MG tablet  Commonly known as:  SEROQUEL  Take 1 tablet (100 mg total) by mouth at bedtime. For mood control     sertraline 25 MG tablet  Commonly known as:  ZOLOFT  Take 3 tablets (75 mg total) by mouth daily.     sildenafil 50 MG tablet  Commonly known as:  VIAGRA  Take 1 tablet (50 mg total) by mouth daily as needed for erectile dysfunction.     traMADol 50 MG tablet  Commonly known as:  ULTRAM  Take 1 tablet (50 mg total) by mouth every 8 (eight) hours as needed.       Allergies  Allergen Reactions  . Shellfish-Derived Products Nausea And Vomiting  . Trazodone And Nefazodone Other (See Comments)    Muscle spasms      The results of significant diagnostics from this hospitalization (including imaging, microbiology, ancillary and laboratory) are listed below for reference.    Significant Diagnostic Studies: No results found.  Microbiology: Recent Results (from the past 240 hour(s))  Urine culture     Status: None   Collection Time: 02/02/15 12:49 PM  Result Value Ref Range Status   Specimen Description URINE, RANDOM  Final   Special Requests NONE  Final   Culture   Final    2,000  COLONIES/mL INSIGNIFICANT GROWTH Performed at [facility]    Report Status 02/03/2015 FINAL  Final     Labs: Basic Metabolic Panel:  Recent Labs Lab 02/02/15 0840 02/03/15 0608  NA 141 138  K 4.6 3.5  CL 110 109  CO2 23 25  GLUCOSE 93 98  BUN 10 <5*  CREATININE 0.97 0.58*  CALCIUM 8.7* 8.3*   Liver Function Tests:  Recent Labs Lab 02/02/15 0840 02/03/15 0608  AST 45* 31  ALT 43 30  ALKPHOS 121 107  BILITOT 0.8 0.9  PROT 7.7 6.8  ALBUMIN 3.8 3.5    Recent Labs Lab 02/02/15 0840 02/03/15 0608 02/04/15 0615  LIPASE 193* 109* 62*   No results for input(s): AMMONIA in the last 168 hours. CBC:  Recent Labs Lab 02/02/15 0840 02/03/15 0608 02/04/15 0615  WBC 11.9*  10.3 8.6  HGB 13.3 11.6* 12.0*  HCT 40.1 35.2* 37.3*  MCV 95.2 95.7 96.1  PLT 180 153 145*   Cardiac Enzymes: No results for input(s): CKTOTAL, CKMB, CKMBINDEX, TROPONINI in the last 168 hours. BNP: BNP (last 3 results) No results for input(s): BNP in the last 8760 hours.  ProBNP (last 3 results) No results for input(s): PROBNP in the last 8760 hours.  CBG: No results for input(s): GLUCAP in the last 168 hours.     SignedDebbe Odea, MD [facility] 02/04/2015, 12:27 PM

## 2015-02-04 NOTE — Progress Notes (Signed)
Pt recently discharged and notified by ED that pt is in the department requesting bus pass and IV NSL removal.  Mika in ED stated she was removing his NSL, but not giving bus pass.

## 2015-02-06 ENCOUNTER — Encounter (HOSPITAL_COMMUNITY): Payer: Self-pay

## 2015-02-06 ENCOUNTER — Emergency Department (HOSPITAL_COMMUNITY)
Admission: EM | Admit: 2015-02-06 | Discharge: 2015-02-06 | Disposition: A | Discharge status: Home / Self Care | Payer: Self-pay | Attending: Emergency Medicine | Admitting: Emergency Medicine

## 2015-02-06 DIAGNOSIS — D649 Anemia, unspecified: Secondary | ICD-10-CM | POA: Insufficient documentation

## 2015-02-06 DIAGNOSIS — Z8739 Personal history of other diseases of the musculoskeletal system and connective tissue: Secondary | ICD-10-CM | POA: Insufficient documentation

## 2015-02-06 DIAGNOSIS — K219 Gastro-esophageal reflux disease without esophagitis: Secondary | ICD-10-CM | POA: Insufficient documentation

## 2015-02-06 DIAGNOSIS — R1013 Epigastric pain: Secondary | ICD-10-CM | POA: Insufficient documentation

## 2015-02-06 DIAGNOSIS — J45909 Unspecified asthma, uncomplicated: Secondary | ICD-10-CM | POA: Insufficient documentation

## 2015-02-06 DIAGNOSIS — F419 Anxiety disorder, unspecified: Secondary | ICD-10-CM | POA: Insufficient documentation

## 2015-02-06 DIAGNOSIS — Z9889 Other specified postprocedural states: Secondary | ICD-10-CM | POA: Insufficient documentation

## 2015-02-06 DIAGNOSIS — I10 Essential (primary) hypertension: Secondary | ICD-10-CM | POA: Insufficient documentation

## 2015-02-06 DIAGNOSIS — Z8781 Personal history of (healed) traumatic fracture: Secondary | ICD-10-CM | POA: Insufficient documentation

## 2015-02-06 DIAGNOSIS — Z8639 Personal history of other endocrine, nutritional and metabolic disease: Secondary | ICD-10-CM | POA: Insufficient documentation

## 2015-02-06 DIAGNOSIS — F329 Major depressive disorder, single episode, unspecified: Secondary | ICD-10-CM | POA: Insufficient documentation

## 2015-02-06 DIAGNOSIS — Z720 Tobacco use: Secondary | ICD-10-CM | POA: Insufficient documentation

## 2015-02-06 DIAGNOSIS — Z79899 Other long term (current) drug therapy: Secondary | ICD-10-CM | POA: Insufficient documentation

## 2015-02-06 DIAGNOSIS — R011 Cardiac murmur, unspecified: Secondary | ICD-10-CM | POA: Insufficient documentation

## 2015-02-06 LAB — COMPREHENSIVE METABOLIC PANEL
ALT: 49 U/L (ref 17–63)
AST: 49 U/L — AB (ref 15–41)
Albumin: 4.1 g/dL (ref 3.5–5.0)
Alkaline Phosphatase: 150 U/L — ABNORMAL HIGH (ref 38–126)
Anion gap: 10 (ref 5–15)
BUN: 7 mg/dL (ref 6–20)
CHLORIDE: 105 mmol/L (ref 101–111)
CO2: 22 mmol/L (ref 22–32)
CREATININE: 0.74 mg/dL (ref 0.61–1.24)
Calcium: 9.1 mg/dL (ref 8.9–10.3)
GFR calc non Af Amer: >60 mL/min (ref >60)
Glucose, Bld: 96 mg/dL (ref 65–99)
POTASSIUM: 4.3 mmol/L (ref 3.5–5.1)
SODIUM: 137 mmol/L (ref 135–145)
Total Bilirubin: 0.3 mg/dL (ref 0.3–1.2)
Total Protein: 8.5 g/dL — ABNORMAL HIGH (ref 6.5–8.1)

## 2015-02-06 LAB — CBC WITH DIFFERENTIAL/PLATELET
Basophils Absolute: 0 10*3/uL (ref 0.0–0.1)
Basophils Relative: 0 %
EOS ABS: 0.3 10*3/uL (ref 0.0–0.7)
Eosinophils Relative: 3 %
HCT: 37 % — ABNORMAL LOW (ref 39.0–52.0)
HEMOGLOBIN: 12.6 g/dL — AB (ref 13.0–17.0)
LYMPHS ABS: 2.5 10*3/uL (ref 0.7–4.0)
LYMPHS PCT: 31 %
MCH: 31.8 pg (ref 26.0–34.0)
MCHC: 34.1 g/dL (ref 30.0–36.0)
MCV: 93.4 fL (ref 78.0–100.0)
MONOS PCT: 6 %
Monocytes Absolute: 0.5 10*3/uL (ref 0.1–1.0)
Neutro Abs: 4.9 10*3/uL (ref 1.7–7.7)
Neutrophils Relative %: 60 %
Platelets: 202 10*3/uL (ref 150–400)
RBC: 3.96 MIL/uL — AB (ref 4.22–5.81)
RDW: 14.2 % (ref 11.5–15.5)
WBC: 8.1 10*3/uL (ref 4.0–10.5)

## 2015-02-06 LAB — LIPASE, BLOOD: LIPASE: 85 U/L — AB (ref 22–51)

## 2015-02-06 MED ORDER — HYDROMORPHONE HCL 1 MG/ML IJ SOLN
0.5000 mg | Freq: Once | INTRAMUSCULAR | Status: AC
  Administered 2015-02-06: 0.5 mg via INTRAVENOUS
  Filled 2015-02-06: qty 1

## 2015-02-06 MED ORDER — SODIUM CHLORIDE 0.9 % IV BOLUS (SEPSIS)
1000.0000 mL | Freq: Once | INTRAVENOUS | Status: AC
  Administered 2015-02-06: 1000 mL via INTRAVENOUS

## 2015-02-06 MED ORDER — HYDROMORPHONE HCL 1 MG/ML IJ SOLN
1.0000 mg | Freq: Once | INTRAMUSCULAR | Status: AC
  Administered 2015-02-06: 1 mg via INTRAVENOUS
  Filled 2015-02-06: qty 1

## 2015-02-06 MED ORDER — ONDANSETRON HCL 4 MG/2ML IJ SOLN
4.0000 mg | Freq: Once | INTRAMUSCULAR | Status: AC
Start: 2015-02-06 — End: 2015-02-06
  Administered 2015-02-06: 4 mg via INTRAVENOUS
  Filled 2015-02-06: qty 2

## 2015-02-06 NOTE — ED Provider Notes (Signed)
CSN: [id number]     Arrival date & time 02/06/15  1220 History   First MD Initiated Contact with Patient 02/06/15 1224     Chief Complaint  Patient presents with  . Abdominal Pain     (Consider location/radiation/quality/duration/timing/severity/associated sxs/prior Treatment) Patient is a 44 y.o. male presenting with abdominal pain.  Abdominal Pain  Pt has a history of pancreatitis, this is alcohol induced, he presents to the hospital today with increased epigastric abdominal pain after drinking 2-40 ounce beers last night. He was vomiting this morning, the pain is persistent, made worse after drinking alcohol, has not had any food today. He was just admitted to the hospital 5 days ago, stayed 2 nights, totally improved. He states he is a daily chronic alcoholic, he no no longer goes to AA, denies depression or suicidality at this time though he does have a history of suicide attempt. No chest pain shortness of breath fevers chills or coughing, no medication given prior to arrival.  Past Medical History  Diagnosis Date  . Hypertension   . Asthma   . Pancreatitis   . Cocaine abuse   . Depression   . H/O suicide attempt   . Heart murmur     "when he was little" (03/06/2013)  . Shortness of breath     "can happen at anytime" (03/06/2013)  . Anemia   . H/O hiatal hernia   . GERD (gastroesophageal reflux disease)   . Anxiety   . WPW (Wolff-Parkinson-White syndrome)     [name] 03/06/2013  . High cholesterol   . Migraine     "monthly" (01/30/2014)  . Arthritis     "knees; arms; elbows" (01/30/2014)  . Femoral condyle fracture 03/08/2014    left medial/notes 03/09/2014  . Alcoholism /alcohol abuse    Past Surgical History  Procedure Laterality Date  . Facial fracture surgery Left 1990's    "result of trauma"   . Eye surgery Left 1990's    "result of trauma"   . Left heart catheterization with coronary angiogram Right 03/07/2013    Procedure: LEFT HEART CATHETERIZATION WITH  CORONARY ANGIOGRAM;  Surgeon: [name], MD;  Location: [place] [facility];  Service: Cardiovascular;  Laterality: Right;   Family History  Problem Relation Age of Onset  . Hypertension Other   . Coronary artery disease Other    Social History  Substance Use Topics  . Smoking status: Current Every Day Smoker -- 1.00 packs/day for 30 years    Types: Cigarettes  . Smokeless tobacco: Current User    Types: Chew  . Alcohol Use: 10.8 oz/week    18 Cans of beer per week     Comment: Last drink Thursday 12/11/14 per patient    Review of Systems  Gastrointestinal: Positive for abdominal pain.  All other systems reviewed and are negative.     Allergies  Shellfish-derived products and Trazodone and nefazodone  Home Medications   Prior to Admission medications   Medication Sig Start Date End Date Taking? Authorizing Provider  acetaminophen (TYLENOL) 500 MG tablet Take 1,000 mg by mouth every 6 (six) hours as needed for moderate pain.   Yes Historical Provider, MD  albuterol (PROVENTIL HFA;VENTOLIN HFA) 108 (90 BASE) MCG/ACT inhaler Inhale 2 puffs into the lungs every 6 (six) hours as needed for wheezing or shortness of breath (wheezing).    Yes Historical Provider, MD  folic acid (FOLVITE) 1 MG tablet Take 1 tablet (1 mg total) by mouth daily. 01/15/15  Yes Olugbemiga  Essie Christine, MD  lipase/protease/amylase (CREON) 12000 UNITS CPEP capsule Take 2 capsules (24,000 Units total) by mouth 3 (three) times daily with meals. 01/15/15  Yes [name], MD  loperamide (IMODIUM) 2 MG capsule Take 1-2 capsules (2-4 mg total) by mouth as needed for diarrhea or loose stools. 01/23/14  Yes [name], PA-C  metoprolol tartrate (LOPRESSOR) 25 MG tablet Take 1 tablet (25 mg total) by mouth 2 (two) times daily. 01/15/15  Yes [name], MD  Multiple Vitamin (MULTIVITAMIN) capsule Take 1 capsule by mouth daily. 02/06/14  Yes [name], MD  ondansetron (ZOFRAN) 8 MG tablet Take 1 tablet (8  mg total) by mouth every 8 (eight) hours as needed for nausea. 01/15/15  Yes [name], MD  pantoprazole (PROTONIX) 20 MG tablet Take 1 tablet (20 mg total) by mouth daily. 01/15/15  Yes [name], MD  QUEtiapine (SEROQUEL) 100 MG tablet Take 1 tablet (100 mg total) by mouth at bedtime. For mood control 01/15/15  Yes [name], MD  sertraline (ZOLOFT) 25 MG tablet Take 3 tablets (75 mg total) by mouth daily. 01/15/15  Yes [name], MD  sildenafil (VIAGRA) 50 MG tablet Take 1 tablet (50 mg total) by mouth daily as needed for erectile dysfunction. 01/15/15  Yes [name], MD  Alum & Mag Hydroxide-Simeth (GI COCKTAIL) SUSP suspension Take 30 mLs by mouth 3 (three) times daily. Shake well. Patient not taking: Reported on 02/06/2015 01/15/15   [name], MD  traMADol (ULTRAM) 50 MG tablet Take 1 tablet (50 mg total) by mouth every 8 (eight) hours as needed. Patient not taking: Reported on 02/06/2015 01/15/15   [name], MD   BP 149/95 mmHg  Pulse 100  Temp(Src) 98.4 F (36.9 C) (Oral)  Resp 18  SpO2 97% Physical Exam  Constitutional: He appears well-developed and well-nourished. No distress.  HENT:  Head: Normocephalic and atraumatic.  Mouth/Throat: Oropharynx is clear and moist. No oropharyngeal exudate.  Eyes: Conjunctivae and EOM are normal. Pupils are equal, round, and reactive to light. Right eye exhibits no discharge. Left eye exhibits no discharge. No scleral icterus.  Neck: Normal range of motion. Neck supple. No JVD present. No thyromegaly present.  Cardiovascular: Normal rate, regular rhythm, normal heart sounds and intact distal pulses.  Exam reveals no gallop and no friction rub.   No murmur heard. Pulmonary/Chest: Effort normal and breath sounds normal. No respiratory distress. He has no wheezes. He has no rales.  Abdominal: Soft. Bowel sounds are normal. He exhibits no distension and no mass. There is tenderness ( mild  epigastric ttp, no guarding, no peritoneal signs, no RUQ or lower abd ttp).  Musculoskeletal: Normal range of motion. He exhibits no edema or tenderness.  Lymphadenopathy:    He has no cervical adenopathy.  Neurological: He is alert. Coordination normal.  Skin: Skin is warm and dry. No rash noted. No erythema.  Psychiatric: He has a normal mood and affect. His behavior is normal.  Nursing note and vitals reviewed.   ED Course  Procedures (including critical care time) Labs Review Labs Reviewed  LIPASE, BLOOD - Abnormal; Notable for the following:    Lipase 85 (*)    All other components within normal limits  COMPREHENSIVE METABOLIC PANEL - Abnormal; Notable for the following:    Total Protein 8.5 (*)    AST 49 (*)    Alkaline Phosphatase 150 (*)    All other components within  normal limits  CBC WITH DIFFERENTIAL/PLATELET - Abnormal; Notable for the following:    RBC 3.96 (*)    Hemoglobin 12.6 (*)    HCT 37.0 (*)    All other components within normal limits    Imaging Review No results found. I have personally reviewed and evaluated these images and lab results as part of my medical decision-making.    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  Labs ordered, pain medication ordered, vital signs unremarkable  Labs do not reveal any significant decline in condition, the patient has had a total of 1 mg of Dilaudid and then a second dose of 0.5 mg of Dilaudid with improvement. Recommended dietary management, no opiates have been prescribed due to the patient's chronic pain, resource list given for follow-up in outpatient setting, counseled patient regarding alcohol use and need for follow-up with Alcoholics Anonymous and other resources for substance abuse. stable for discharge  Noemi Chapel, MD 02/06/15 1432

## 2015-02-06 NOTE — ED Notes (Signed)
Bed: GJ15 Expected date:  Expected time:  Means of arrival:  Comments: Ems-abdominal

## 2015-02-06 NOTE — Discharge Instructions (Signed)
Please obtain all of your results from medical records or have your doctors office obtain the results - share them with your doctor - you should be seen at your doctors office in the next 2 days. Call today to arrange your follow up. Take the medications as prescribed. Please review all of the medicines and only take them if you do not have an allergy to them. Please be aware that if you are taking birth control pills, taking other prescriptions, ESPECIALLY ANTIBIOTICS may make the birth control ineffective - if this is the case, either do not engage in sexual activity or use alternative methods of birth control such as condoms until you have finished the medicine and your family doctor says it is OK to restart them. If you are on a blood thinner such as COUMADIN, be aware that any other medicine that you take may cause the coumadin to either work too much, or not enough - you should have your coumadin level rechecked in next 7 days if this is the case.  ?  It is also a possibility that you have an allergic reaction to any of the medicines that you have been prescribed - Everybody reacts differently to medications and while MOST people have no trouble with most medicines, you may have a reaction such as nausea, vomiting, rash, swelling, shortness of breath. If this is the case, please stop taking the medicine immediately and contact your physician.  ?  You should return to the ER if you develop severe or worsening symptoms.    Emergency Department Resource Guide 1) Find a Doctor and Pay Out of Pocket Although you won't have to find out who is covered by your insurance plan, it is a good idea to ask around and get recommendations. You will then need to call the office and see if the doctor you have chosen will accept you as a new patient and what types of options they offer for patients who are self-pay. Some doctors offer discounts or will set up payment plans for their patients who do not have insurance,  but you will need to ask so you aren't surprised when you get to your appointment.  2) Contact Your Local Health Department Not all health departments have doctors that can see patients for sick visits, but many do, so it is worth a call to see if yours does. If you don't know where your local health department is, you can check in your phone book. The CDC also has a tool to help you locate your state's health department, and many state websites also have listings of all of their local health departments.  3) Find a [facility] If your illness is not likely to be very severe or complicated, you may want to try a walk in clinic. These are popping up all over the country in pharmacies, drugstores, and shopping centers. They're usually staffed by nurse practitioners or physician assistants that have been trained to treat common illnesses and complaints. They're usually fairly quick and inexpensive. However, if you have serious medical issues or chronic medical problems, these are probably not your best option.  No Primary Care Doctor: - Call Health Connect at  [phone] - they can help you locate a primary care doctor that  accepts your insurance, provides certain services, etc. - Physician Referral Service- [phone]  Chronic Pain Problems: Organization         Address  Phone   Notes  [facility]  [phone] Patients need to be referred by their primary care doctor.   Medication Assistance: Organization         Address  Phone   Notes  [facility] Medication [facility] [address] [place], [place] [address] [phone] --Must be a resident of [facility] -- Must have NO insurance coverage whatsoever (no Medicaid/ Medicare, etc.) -- The pt. MUST have a primary care doctor that directs their care regularly and follows them in the community   Med[facility]ist  [phone]   [employer]  [phone]    Agencies that provide inexpensive  medical care: Organization         Address  Phone   Notes  [place]  [phone]   [facility]    [phone]   [facility] [place], [place] [address] [phone]   [place] [address], [place] [phone]   Planned Parenthood    [phone]   [facility]    [phone]   [place] and [place] [address], [place] Phone:  [phone], Fax:  [phone] Hours of Operation:  9 am - 6 pm, M-F.  Also accepts Medicaid/Medicare and self-pay.  [facility] for [place], [address], [place] Phone: [phone], Fax: [phone]. Hours of Operation:  8:30 am - 5:30 pm, M-F.  Also accepts Medicaid and self-pay.  [facility] [place] [address] [place] Phone: [phone]   [place], [place], [place] [phone], Ext. [phone] Mondays & Thursdays: 7-9 AM.  First 15 patients are seen on a first come, first serve basis.    [place] Providers:  Organization         Address  Phone   Notes  [facility] [address], [place] [phone] Also accepts self-pay patients.  [facility] [address], [place]  [phone]   [place], [address], [place] [phone]   [facility] [address] [place] [phone]   Lucianne Lei [address], [place]   [phone] Only accepts [place] Access [place] patients after they have their name applied to their card.   Self-Pay (no insurance) in [facility]:  Organization         Address  Phone   Notes  Sickle Cell Patients, [facility] [place] [phone]   [facility] [place] [phone]   [facility] [place]  [place], [place], [place] [phone]   [facility]/Dr. Osei-Bonsu  [address], [place] or [address], [place] [phone] Phone number for both [place] and [place] locations is the same.  Urgent Medical and [facility] [address] [place] [phone]   [facility] [address] [place] or [address] [phone]   [facility] [address], [place] [phone], phone; [phone], fax Sees patients 1st and 3rd Saturday of every month.  Must not qualify for public or private insurance (i.e. Medicaid, Medicare, [place] Health Choice, Veterans' Benefits)  Household income should be no more than 200% of the poverty level The clinic cannot treat you if you are pregnant or think you are pregnant  Sexually transmitted diseases are not treated at the clinic.    Dental Care: Organization         Address  Phone  Notes  [facility] [place] [phone] Accepts children up to age 56 who are enrolled in [place] or [place]; pregnant women with a Medicaid card; and children who have applied for Medicaid or [place] Health Choice, but were declined, whose parents can pay a reduced fee at time of service.  [facility] Department of [facility]  [address], [place] [phone] Accepts children up to age 33 who are enrolled in [place] or [place]; pregnant women with a Medicaid card; and children who have applied for Medicaid or  Health Choice, but were declined, whose parents can pay a reduced fee at time of service.  [place] Adult [facility] Access PROGRAM  [place] [phone] Patients are seen by appointment only. Walk-ins are not accepted.  [place] will see patients 20 years of age and older. Monday - Tuesday (8am-5pm) Most Wednesdays (8:30-5pm) $30 per visit, cash only  [facility]  [address], [facility] [phone] Patients are seen by appointment only. Walk-ins are not accepted. [place] will see patients 72 years of age and older. One Wednesday Evening (Monthly: Volunteer Based).  $30 per visit, cash only  [place]  [phone] for adults; Children under age 54, call Graduate Pediatric Dentistry at [phone]. Children aged 12-14, please call [phone] to request a pediatric application.  Dental services are provided in all areas of dental care including fillings, crowns and bridges, complete and partial dentures, implants, gum treatment, root canals, and extractions. Preventive care is also provided. Treatment is provided to both adults and children. Patients are selected via a lottery and there is often a waiting list.   [facility] [address] [place]  [phone] [email or web address]   [facility] [address] [place], [place] [phone], Ext. [phone] Second and Fourth Thursday of each month, opens at 6:30 AM; Clinic ends at 9 AM.  Patients are seen on a first-come first-served basis, and a limited number are seen during each clinic.   [facility]  [address] Hillard Danker [place], [place] [phone]   Eligibility Requirements You must have lived in [place], [place], or [place] counties for at least the last three months.   You cannot be eligible for state or federal sponsored [employer], including [employer], [place], or [employer].   You generally cannot be eligible for healthcare insurance through your employer.    How to apply: Eligibility screenings are held every Tuesday and Wednesday afternoon from 1:00 pm until 4:00 pm. You do not need an appointment for the  interview!  [facility] [address], [place], [place]   [place]  [place]  [place]  [phone]    Behavioral Health Resources in the Community: Intensive Outpatient Programs Organization         Address  Phone  Notes  [place]. [address], [place],  [place] [phone]   [facility] [address], [place], [place]   ADS: Alcohol & Drug S[facility] [address] [place], [place]   [place] [address],  [place], [place] or [phone]   Substance Abuse Resources Organization         Address  Phone  Notes  Alcohol and Drug Services  [phone]   [place]  [phone]   [facility] [place]   Chinita Pester  [phone]   Residential & Outpatient Substance Abuse Program  [phone]   Psychological Services Organization         Address  Phone  Notes  [facility]  [place]  [phone]   [place] [address], [place] or [phone]    Mobile Crisis Teams Organization         Address  Phone  Notes  [facility], Mobile Crisis Care Unit  [phone]   [facility]  [address] [place], [place]   [place] Levels [address] [place] [phone]    Self-Help/Support Groups Organization         Address  Phone             Notes  [place]. of [place] - variety of support groups  [place] Call for more information  Narcotics Anonymous (NA), [facility] [address], [employer] [place]  2 meetings at this location   Special educational needs teacher         Address  Phone  Notes  [facility]  [place],    [place]  [phone]   [facility]  [address] [place] [phone], [place], [place]   [place], [place] [phone] Admissions: 8am-3pm M-F  Incentives Substance [place] [address],    [place], [place] [phone]   [facility] [address] [place], [place], [place]   [facility] [address],  [place], [place]   [facility] - Intensive Outpatient [address] Kristeen [address], [place], [place]   [facility] ([place].) [place].,  [place], [place] [phone] or [phone]   [facility] Services (RTS) [address] [place], [place] Accepts Medicaid  [facility] [place] [address]  [place] [phone] Substance Abuse/Addiction Treatment   [facility] Organization         Address  Phone  Notes  [facility]  [phone]   Domenic Schwab, PhD [address] Arlis Porta [place], [place]   [phone] or [phone]   [place], [place] [phone]   [facility] [address] [place], [place] [phone] Insurance/Medicaid/sponsorship through [facility] and Families [address] [place]                                    [place], [place] [phone] [place] [address], [place] [phone]    Dr. Adele Schilder  [phone]   [facility] Dept. 1) [address], [place] 2) [address], [place] 3)  [place], [place] [phone]  [phone]   [facility] Child Abuse Hotline [phone] or [phone] (After Hours)

## 2015-02-06 NOTE — ED Notes (Signed)
Patient presents to ED by EMS with hx of pancreatitis. Patient was seen here and discharged from [facility] on Wed and was admitted from the ED to the floor on this past Saturday. Patient drank two 40oz. Beers last night. Patient began vomiting at 9am this morning.

## 2015-02-07 IMAGING — CT CT ABD-PELV W/ CM
2 of 5 series · 14 of 32 positions shown, 19 images · IV contrast (omnipaque)
Comparison: 08/18/2010

CLINICAL DATA: Abdominal pain.  Pancreatitis.  Elevated serum
lipase.

CT ABDOMEN AND PELVIS WITH CONTRAST
TECHNIQUE: Multidetector CT imaging of the abdomen and pelvis was
performed following the standard protocol during bolus
administration of intravenous contrast.
Contrast: 100mL OMNIPAQUE IOHEXOL 300 MG/ML  SOLN

[Series 2: routine abdomen · axial · 0.79mm/px · z∈[-450,-120]mm · 7 of 88 slices shown, 12 images]
[im 11/88  soft-tissue]
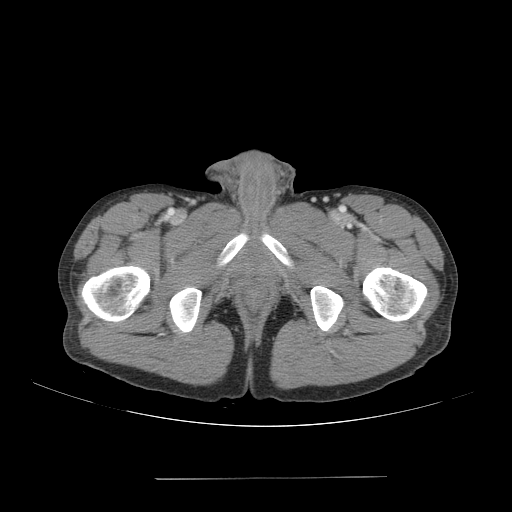
[im 11/88  bone]
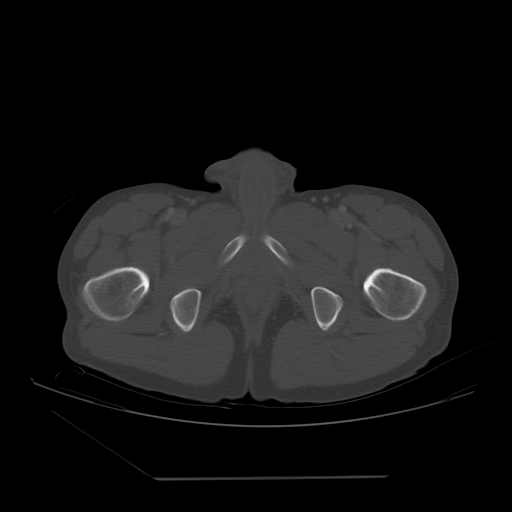
[im 22/88  soft-tissue]
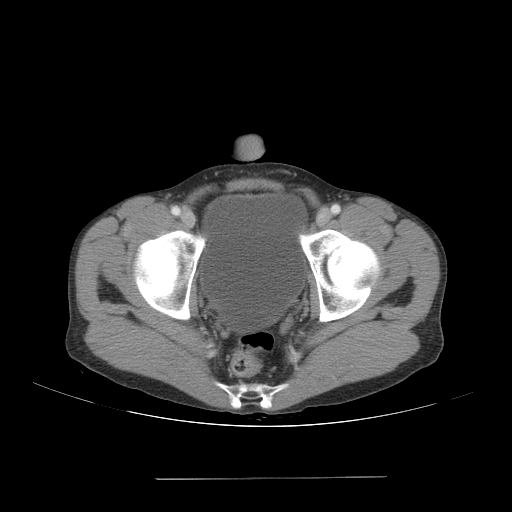
[im 33/88  soft-tissue]
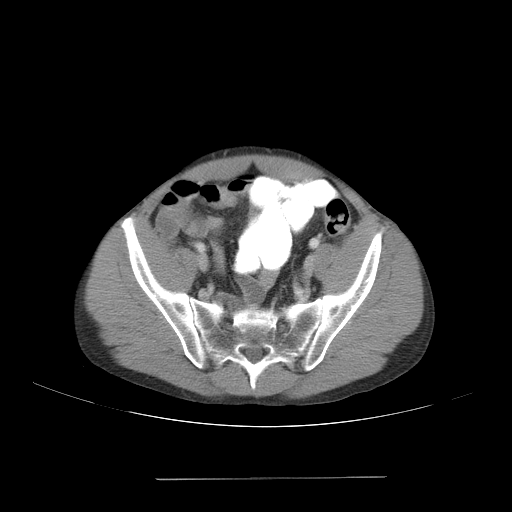
[im 44/88  soft-tissue]
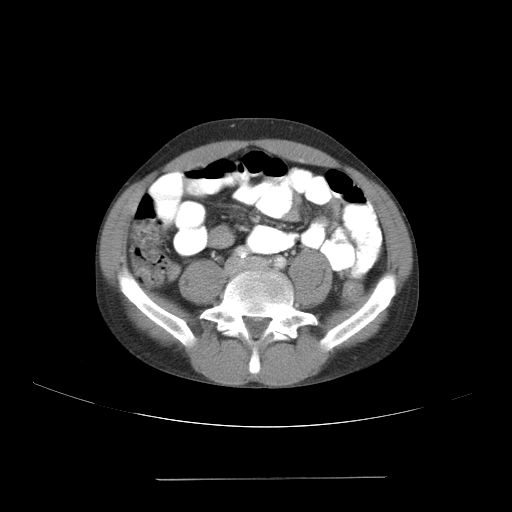
[im 44/88  lung]
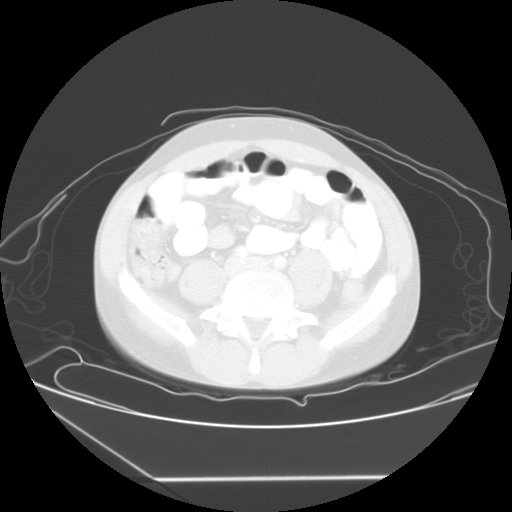
[im 55/88  soft-tissue]
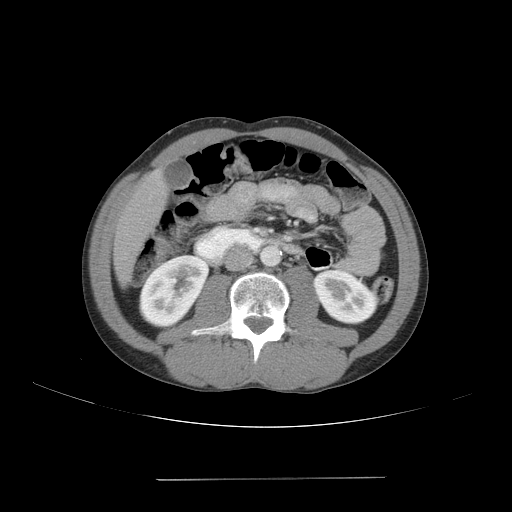
[im 55/88  lung]
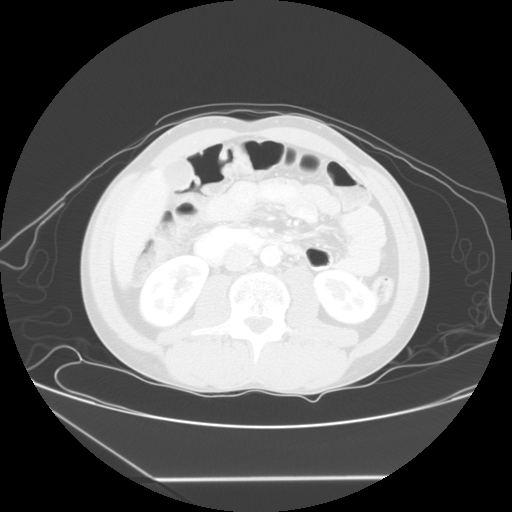
[im 66/88  soft-tissue]
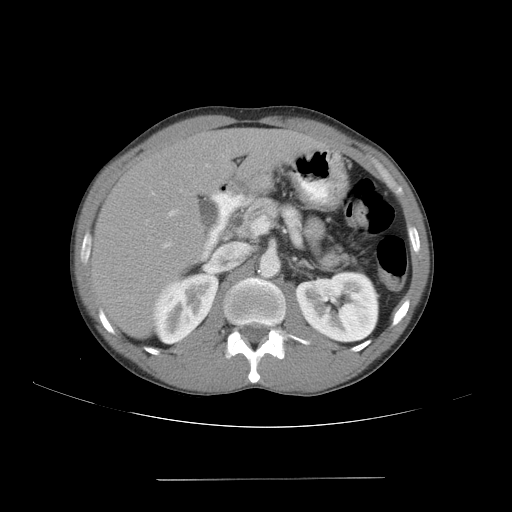
[im 66/88  lung]
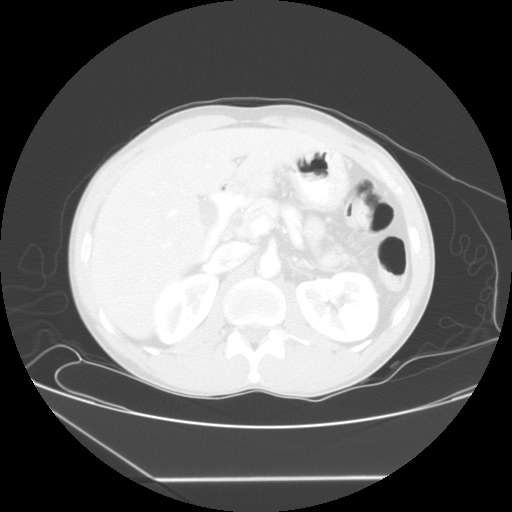
[im 77/88  soft-tissue]
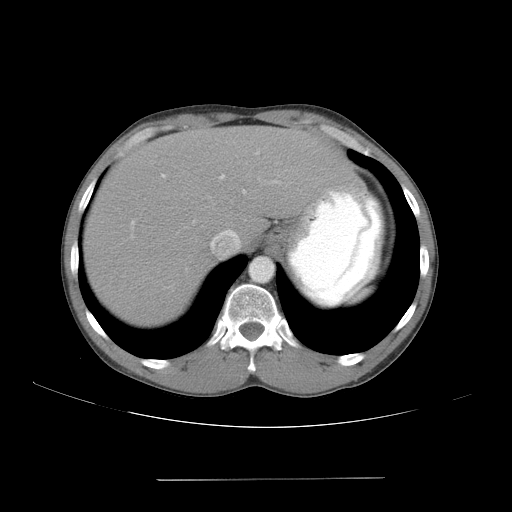
[im 77/88  lung]
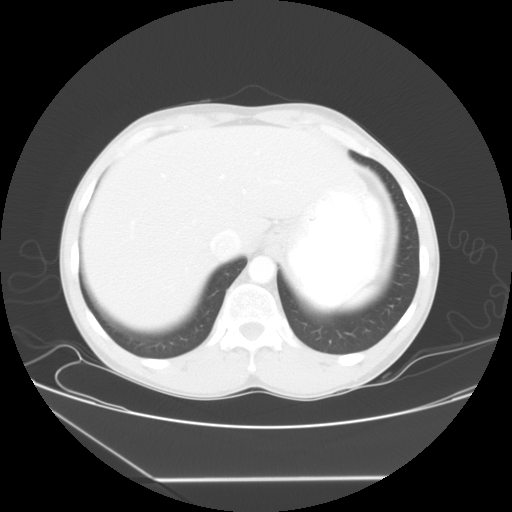

[Series 401: sag · sagittal · 0.87mm/px · 7 of 95 slices shown]
[im 12/95  soft-tissue]
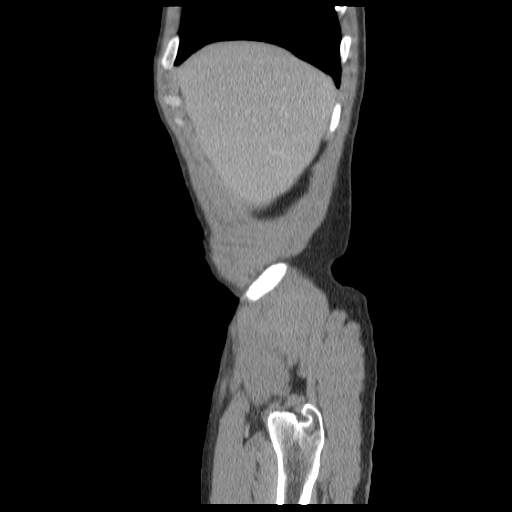
[im 24/95  soft-tissue]
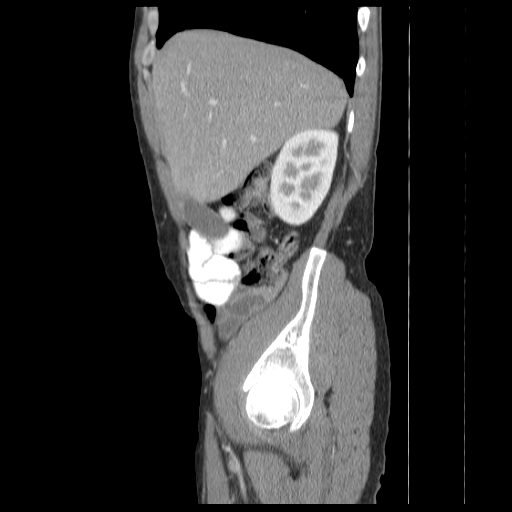
[im 36/95  soft-tissue]
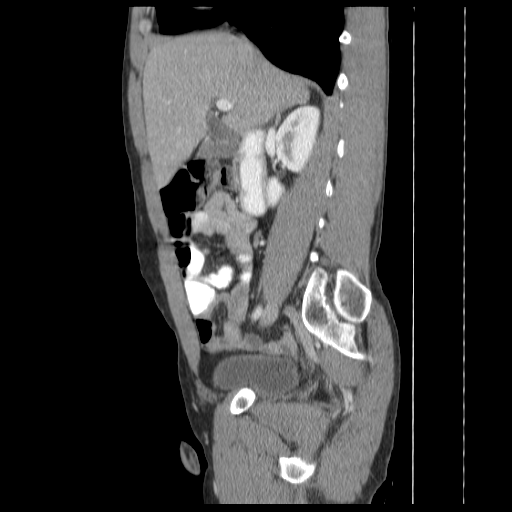
[im 48/95  soft-tissue]
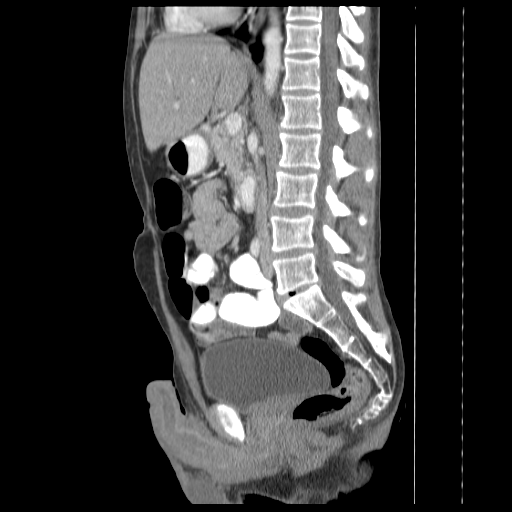
[im 59/95  soft-tissue]
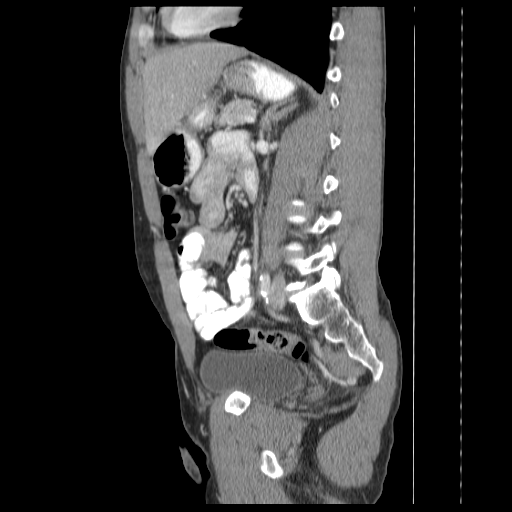
[im 71/95  soft-tissue]
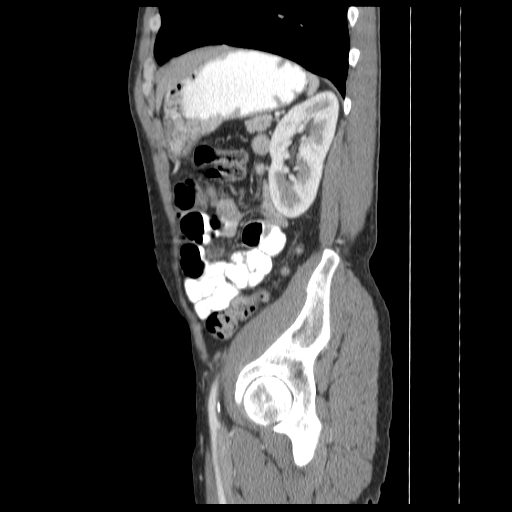
[im 83/95  soft-tissue]
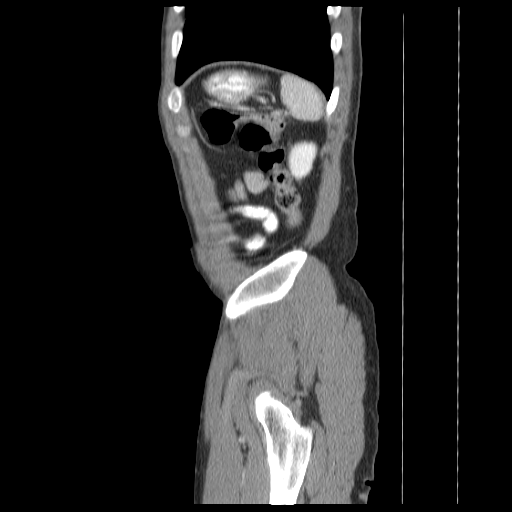

[14 of 32 positions shown; findings below may reference images not displayed]

FINDINGS: The pancreas is normal in appearance.  No evidence of
peripancreatic inflammatory changes of fluid collections.  No
evidence of pancreatic mass.

The liver, gallbladder, spleen, adrenal glands, and kidneys are
normal appearance.  No evidence of hydronephrosis.

No soft tissue masses or lymphadenopathy identified within the
abdomen or pelvis.  No evidence of inflammatory process or abnormal
fluid collections.  No evidence of bowel wall thickening,
dilatation, or hernia.
IMPRESSION: Negative.  No acute findings or other significant abnormality
identified.

## 2015-02-08 ENCOUNTER — Emergency Department (HOSPITAL_COMMUNITY)
Admission: EM | Admit: 2015-02-08 | Discharge: 2015-02-08 | Disposition: A | Discharge status: Home / Self Care | Payer: Self-pay | Attending: Emergency Medicine | Admitting: Emergency Medicine

## 2015-02-08 ENCOUNTER — Encounter (HOSPITAL_COMMUNITY): Payer: Self-pay | Admitting: Emergency Medicine

## 2015-02-08 ENCOUNTER — Emergency Department (HOSPITAL_COMMUNITY): Payer: Self-pay

## 2015-02-08 DIAGNOSIS — F419 Anxiety disorder, unspecified: Secondary | ICD-10-CM | POA: Insufficient documentation

## 2015-02-08 DIAGNOSIS — K860 Alcohol-induced chronic pancreatitis: Secondary | ICD-10-CM | POA: Insufficient documentation

## 2015-02-08 DIAGNOSIS — I10 Essential (primary) hypertension: Secondary | ICD-10-CM | POA: Insufficient documentation

## 2015-02-08 DIAGNOSIS — R197 Diarrhea, unspecified: Secondary | ICD-10-CM

## 2015-02-08 DIAGNOSIS — R112 Nausea with vomiting, unspecified: Secondary | ICD-10-CM

## 2015-02-08 DIAGNOSIS — D649 Anemia, unspecified: Secondary | ICD-10-CM | POA: Insufficient documentation

## 2015-02-08 DIAGNOSIS — M1712 Unilateral primary osteoarthritis, left knee: Secondary | ICD-10-CM | POA: Insufficient documentation

## 2015-02-08 DIAGNOSIS — Z720 Tobacco use: Secondary | ICD-10-CM | POA: Insufficient documentation

## 2015-02-08 DIAGNOSIS — R011 Cardiac murmur, unspecified: Secondary | ICD-10-CM | POA: Insufficient documentation

## 2015-02-08 DIAGNOSIS — M1711 Unilateral primary osteoarthritis, right knee: Secondary | ICD-10-CM | POA: Insufficient documentation

## 2015-02-08 DIAGNOSIS — R1013 Epigastric pain: Secondary | ICD-10-CM

## 2015-02-08 DIAGNOSIS — Z8639 Personal history of other endocrine, nutritional and metabolic disease: Secondary | ICD-10-CM | POA: Insufficient documentation

## 2015-02-08 DIAGNOSIS — Z8781 Personal history of (healed) traumatic fracture: Secondary | ICD-10-CM | POA: Insufficient documentation

## 2015-02-08 DIAGNOSIS — J45909 Unspecified asthma, uncomplicated: Secondary | ICD-10-CM | POA: Insufficient documentation

## 2015-02-08 DIAGNOSIS — F329 Major depressive disorder, single episode, unspecified: Secondary | ICD-10-CM | POA: Insufficient documentation

## 2015-02-08 DIAGNOSIS — Z9889 Other specified postprocedural states: Secondary | ICD-10-CM | POA: Insufficient documentation

## 2015-02-08 LAB — COMPREHENSIVE METABOLIC PANEL
ALK PHOS: 145 U/L — AB (ref 38–126)
ALT: 81 U/L — ABNORMAL HIGH (ref 17–63)
ANION GAP: 10 (ref 5–15)
AST: 179 U/L — ABNORMAL HIGH (ref 15–41)
Albumin: 3.9 g/dL (ref 3.5–5.0)
BILIRUBIN TOTAL: 0.7 mg/dL (ref 0.3–1.2)
BUN: 9 mg/dL (ref 6–20)
CALCIUM: 9 mg/dL (ref 8.9–10.3)
CO2: 23 mmol/L (ref 22–32)
Chloride: 101 mmol/L (ref 101–111)
Creatinine, Ser: 0.74 mg/dL (ref 0.61–1.24)
Glucose, Bld: 103 mg/dL — ABNORMAL HIGH (ref 65–99)
Potassium: 4 mmol/L (ref 3.5–5.1)
SODIUM: 134 mmol/L — AB (ref 135–145)
TOTAL PROTEIN: 7.8 g/dL (ref 6.5–8.1)

## 2015-02-08 LAB — CBC
HCT: 37.7 % — ABNORMAL LOW (ref 39.0–52.0)
HEMOGLOBIN: 12.6 g/dL — AB (ref 13.0–17.0)
MCH: 31.3 pg (ref 26.0–34.0)
MCHC: 33.4 g/dL (ref 30.0–36.0)
MCV: 93.8 fL (ref 78.0–100.0)
Platelets: 216 10*3/uL (ref 150–400)
RBC: 4.02 MIL/uL — AB (ref 4.22–5.81)
RDW: 14.3 % (ref 11.5–15.5)
WBC: 9.7 10*3/uL (ref 4.0–10.5)

## 2015-02-08 LAB — URINALYSIS, ROUTINE W REFLEX MICROSCOPIC
Bilirubin Urine: NEGATIVE
Glucose, UA: NEGATIVE mg/dL
Hgb urine dipstick: NEGATIVE
KETONES UR: NEGATIVE mg/dL
LEUKOCYTES UA: NEGATIVE
NITRITE: NEGATIVE
PROTEIN: NEGATIVE mg/dL
Specific Gravity, Urine: 1.012 (ref 1.005–1.030)
UROBILINOGEN UA: 0.2 mg/dL (ref 0.0–1.0)
pH: 5.5 (ref 5.0–8.0)

## 2015-02-08 LAB — LIPASE, BLOOD: Lipase: 51 U/L (ref 22–51)

## 2015-02-08 MED ORDER — OMEPRAZOLE 20 MG PO CPDR: 20.0000 mg | DELAYED_RELEASE_CAPSULE | Freq: Every day | ORAL | Status: DC

## 2015-02-08 MED ORDER — HYDROMORPHONE HCL 1 MG/ML IJ SOLN
1.0000 mg | Freq: Once | INTRAMUSCULAR | Status: AC
  Administered 2015-02-08: 1 mg via INTRAVENOUS
  Filled 2015-02-08: qty 1

## 2015-02-08 MED ORDER — OXYCODONE-ACETAMINOPHEN 5-325 MG PO TABS
1.0000 | ORAL_TABLET | Freq: Once | ORAL | Status: AC
  Administered 2015-02-08: 1 via ORAL
  Filled 2015-02-08: qty 1

## 2015-02-08 MED ORDER — ONDANSETRON HCL 4 MG/2ML IJ SOLN
4.0000 mg | Freq: Once | INTRAMUSCULAR | Status: AC
  Administered 2015-02-08: 4 mg via INTRAVENOUS
  Filled 2015-02-08: qty 2

## 2015-02-08 MED ORDER — SODIUM CHLORIDE 0.9 % IV BOLUS (SEPSIS)
1000.0000 mL | Freq: Once | INTRAVENOUS | Status: AC
  Administered 2015-02-08: 1000 mL via INTRAVENOUS

## 2015-02-08 MED ORDER — ONDANSETRON 4 MG PO TBDP
4.0000 mg | ORAL_TABLET | Freq: Once | ORAL | Status: AC | PRN
  Administered 2015-02-08: 4 mg via ORAL
  Filled 2015-02-08: qty 1

## 2015-02-08 MED ORDER — GI COCKTAIL ~~LOC~~
30.0000 mL | Freq: Once | ORAL | Status: AC
  Administered 2015-02-08: 30 mL via ORAL
  Filled 2015-02-08: qty 30

## 2015-02-08 MED ORDER — ONDANSETRON 4 MG PO TBDP: 4.0000 mg | ORAL_TABLET | Freq: Three times a day (TID) | ORAL | Status: DC | PRN

## 2015-02-08 MED ORDER — HYDROMORPHONE HCL 1 MG/ML IJ SOLN
0.5000 mg | Freq: Once | INTRAMUSCULAR | Status: AC
  Administered 2015-02-08: 0.5 mg via INTRAVENOUS
  Filled 2015-02-08: qty 1

## 2015-02-08 NOTE — ED Notes (Signed)
Provider at bedside

## 2015-02-08 NOTE — ED Notes (Signed)
Pt transported to ultrasound.

## 2015-02-08 NOTE — ED Notes (Signed)
Pt presents with [facility] for LUQ abd pain x 4 days; pt reports being seen at [facility] x 2 days ago for same and dx with pancreatitis; pt reports adhering to recommended diet without improvement; pt also reports no etoh since dx; pt denies symptoms of withdrawal at this time

## 2015-02-08 NOTE — Discharge Instructions (Signed)
Abdominal Pain Many things can cause abdominal pain. Usually, abdominal pain is not caused by a disease and will improve without treatment. It can often be observed and treated at home. Your health care provider will do a physical exam and possibly order blood tests and X-rays to help determine the seriousness of your pain. However, in many cases, more time must pass before a clear cause of the pain can be found. Before that point, your health care provider may not know if you need more testing or further treatment. HOME CARE INSTRUCTIONS  Monitor your abdominal pain for any changes. The following actions may help to alleviate any discomfort you are experiencing:  Only take over-the-counter or prescription medicines as directed by your health care provider.  Do not take laxatives unless directed to do so by your health care provider.  Try a clear liquid diet (broth, tea, or water) as directed by your health care provider. Slowly move to a bland diet as tolerated. SEEK MEDICAL CARE IF:  You have unexplained abdominal pain.  You have abdominal pain associated with nausea or diarrhea.  You have pain when you urinate or have a bowel movement.  You experience abdominal pain that wakes you in the night.  You have abdominal pain that is worsened or improved by eating food.  You have abdominal pain that is worsened with eating fatty foods.  You have a fever. SEEK IMMEDIATE MEDICAL CARE IF:   Your pain does not go away within 2 hours.  You keep throwing up (vomiting).  Your pain is felt only in portions of the abdomen, such as the right side or the left lower portion of the abdomen.  You pass bloody or black tarry stools. MAKE SURE YOU:  Understand these instructions.   Will watch your condition.   Will get help right away if you are not doing well or get worse.  Document Released: 02/02/2005 Document Revised: 04/30/2013 Document Reviewed: 01/02/2013 [facility] Patient Information  2015 [place], [place]. This information is not intended to replace advice given to you by your health care provider. Make sure you discuss any questions you have with your health care provider. Acute Pancreatitis Acute pancreatitis is a disease in which the pancreas becomes suddenly inflamed. The pancreas is a large gland located behind your stomach. The pancreas produces enzymes that help digest food. The pancreas also releases the hormones glucagon and insulin that help regulate blood sugar. Damage to the pancreas occurs when the digestive enzymes from the pancreas are activated and begin attacking the pancreas before being released into the intestine. Most acute attacks last a couple of days and can cause serious complications. Some people become dehydrated and develop low blood pressure. In severe cases, bleeding into the pancreas can lead to shock and can be life-threatening. The lungs, heart, and kidneys may fail. CAUSES  Pancreatitis can happen to anyone. In some cases, the cause is unknown. Most cases are caused by:  Alcohol abuse.  Gallstones. Other less common causes are:  Certain medicines.  Exposure to certain chemicals.  Infection.  Damage caused by an accident (trauma).  Abdominal surgery. SYMPTOMS   Pain in the upper abdomen that may radiate to the back.  Tenderness and swelling of the abdomen.  Nausea and vomiting. DIAGNOSIS  Your caregiver will perform a physical exam. Blood and stool tests may be done to confirm the diagnosis. Imaging tests may also be done, such as X-rays, CT scans, or an ultrasound of the abdomen. TREATMENT  Treatment usually  requires a stay in the hospital. Treatment may include:  Pain medicine.  Fluid replacement through an intravenous line (IV).  Placing a tube in the stomach to remove stomach contents and control vomiting.  Not eating for 3 or 4 days. This gives your pancreas a rest, because enzymes are not being produced that can cause  further damage.  Antibiotic medicines if your condition is caused by an infection.  Surgery of the pancreas or gallbladder. HOME CARE INSTRUCTIONS   Follow the diet advised by your caregiver. This may involve avoiding alcohol and decreasing the amount of fat in your diet.  Eat smaller, more frequent meals. This reduces the amount of digestive juices the pancreas produces.  Drink enough fluids to keep your urine clear or pale yellow.  Only take over-the-counter or prescription medicines as directed by your caregiver.  Avoid drinking alcohol if it caused your condition.  Do not smoke.  Get plenty of rest.  Check your blood sugar at home as directed by your caregiver.  Keep all follow-up appointments as directed by your caregiver. SEEK MEDICAL CARE IF:   You do not recover as quickly as expected.  You develop new or worsening symptoms.  You have persistent pain, weakness, or nausea.  You recover and then have another episode of pain. SEEK IMMEDIATE MEDICAL CARE IF:   You are unable to eat or keep fluids down.  Your pain becomes severe.  You have a fever or persistent symptoms for more than 2 to 3 days.  You have a fever and your symptoms suddenly get worse.  Your skin or the white part of your eyes turn yellow (jaundice).  You develop vomiting.  You feel dizzy, or you faint.  Your blood sugar is high (over 300 mg/dL). MAKE SURE YOU:   Understand these instructions.  Will watch your condition.  Will get help right away if you are not doing well or get worse. Document Released: 04/25/2005 Document Revised: 10/25/2011 Document Reviewed: 08/04/2011 [facility] Patient Information 2015 [place], [place]. This information is not intended to replace advice given to you by your health care provider. Make sure you discuss any questions you have with your health care provider. Low-Fat Diet for Pancreatitis or Gallbladder Conditions A low-fat diet can be helpful if you have  pancreatitis or a gallbladder condition. With these conditions, your pancreas and gallbladder have trouble digesting fats. A healthy eating plan with less fat will help rest your pancreas and gallbladder and reduce your symptoms. WHAT DO I NEED TO KNOW ABOUT THIS DIET?  Eat a low-fat diet.  Reduce your fat intake to less than 20-30% of your total daily calories. This is less than 50-60 g of fat per day.  Remember that you need some fat in your diet. Ask your dietician what your daily goal should be.  Choose nonfat and low-fat healthy foods. Look for the words "nonfat," "low fat," or "fat free."  As a guide, look on the label and choose foods with less than 3 g of fat per serving. Eat only one serving.  Avoid alcohol.  Do not smoke. If you need help quitting, talk with your health care provider.  Eat small frequent meals instead of three large heavy meals. WHAT FOODS CAN I EAT? Grains Include healthy grains and starches such as potatoes, wheat bread, fiber-rich cereal, and brown rice. Choose whole grain options whenever possible. In adults, whole grains should account for 45-65% of your daily calories.  Fruits and Vegetables Eat plenty of fruits  and vegetables. Fresh fruits and vegetables add fiber to your diet. Meats and Other Protein Sources Eat lean meat such as chicken and pork. Trim any fat off of meat before cooking it. Eggs, fish, and beans are other sources of protein. In adults, these foods should account for 10-35% of your daily calories. Dairy Choose low-fat milk and dairy options. Dairy includes fat and protein, as well as calcium.  Fats and Oils Limit high-fat foods such as fried foods, sweets, baked goods, sugary drinks.  Other Creamy sauces and condiments, such as mayonnaise, can add extra fat. Think about whether or not you need to use them, or use smaller amounts or low fat options. WHAT FOODS ARE NOT RECOMMENDED?  High fat foods, such as:  [employer].  Ice  cream.  [place] toast.  Sweet rolls.  Pizza.  Cheese bread.  Foods covered with batter, butter, creamy sauces, or cheese.  Fried foods.  Sugary drinks and desserts.  Foods that cause gas or bloating Document Released: 04/30/2013 Document Reviewed: 04/30/2013 [facility] Patient Information 2015 [place], [place]. This information is not intended to replace advice given to you by your health care provider. Make sure you discuss any questions you have with your health care provider.

## 2015-02-08 NOTE — ED Notes (Signed)
PT given ginger ale and graham crackers per Dansie PA-C.

## 2015-02-08 NOTE — ED Provider Notes (Signed)
CSN: [id number]     Arrival date & time 02/08/15  0550 History   First MD Initiated Contact with Patient 02/08/15 [phone]     Chief Complaint  Patient presents with  . Abdominal Pain  . Nausea  . Emesis   Johari R Godek is a 44 y.o. male with a history of alcohol induced pancreatitis who presents to the emergency department complaining of ongoing epigastric and left upper quadrant abdominal pain for the past 4 days. Patient reports he was seen in the [facility] emergency department 2 days ago and was diagnosed with pancreatitis. Patient has a history of pancreatitis due to his chronic alcohol use. Patient reports he's had no alcohol to drink in the past 2 days. He denies symptoms of alcohol withdrawal. The patient also reports he's had nausea, vomiting and diarrhea for the past 2 days. He reports 2 episodes of  NBNB vomiting today and several episodes of watery diarrhea. The patient reports he typically drinks two 40 ounce beers a day. Patient currently complains of 8 out of 10 epigastric abdominal pain. The patient denies fevers, chills, chest pain, shortness breath, tremors, hematemesis, hematochezia, urinary symptoms, hematuria, seizures, or rashes.  (Consider location/radiation/quality/duration/timing/severity/associated sxs/prior Treatment) HPI  Past Medical History  Diagnosis Date  . Hypertension   . Asthma   . Pancreatitis   . Cocaine abuse   . Depression   . H/O suicide attempt   . Heart murmur     "when he was little" (03/06/2013)  . Shortness of breath     "can happen at anytime" (03/06/2013)  . Anemia   . H/O hiatal hernia   . GERD (gastroesophageal reflux disease)   . Anxiety   . WPW (Wolff-Parkinson-White syndrome)     [name] 03/06/2013  . High cholesterol   . Migraine     "monthly" (01/30/2014)  . Arthritis     "knees; arms; elbows" (01/30/2014)  . Femoral condyle fracture ([place]) 03/08/2014    left medial/notes 03/09/2014  . Alcoholism /alcohol abuse [facility])    Past  Surgical History  Procedure Laterality Date  . Facial fracture surgery Left 1990's    "result of trauma"   . Eye surgery Left 1990's    "result of trauma"   . Left heart catheterization with coronary angiogram Right 03/07/2013    Procedure: LEFT HEART CATHETERIZATION WITH CORONARY ANGIOGRAM;  Surgeon: [name], MD;  Location: [facility]ATH [facility];  Service: Cardiovascular;  Laterality: Right;   Family History  Problem Relation Age of Onset  . Hypertension Other   . Coronary artery disease Other    Social History  Substance Use Topics  . Smoking status: Current Every Day Smoker -- 1.00 packs/day for 30 years    Types: Cigarettes  . Smokeless tobacco: Current User    Types: Chew  . Alcohol Use: 10.8 oz/week    18 Cans of beer per week     Comment: Last drink Thursday 12/11/14 per patient    Review of Systems  Constitutional: Negative for fever and chills.  HENT: Negative for congestion and sore throat.   Eyes: Negative for visual disturbance.  Respiratory: Negative for cough and shortness of breath.   Cardiovascular: Negative for chest pain and palpitations.  Gastrointestinal: Positive for nausea, vomiting, abdominal pain and diarrhea. Negative for blood in stool.  Genitourinary: Negative for dysuria, urgency, frequency, hematuria and difficulty urinating.  Musculoskeletal: Negative for back pain and neck pain.  Skin: Negative for rash.  Neurological: Negative for tremors and headaches.  Allergies  Shellfish-derived products and Trazodone and nefazodone  Home Medications   Prior to Admission medications   Medication Sig Start Date End Date Taking? Authorizing Provider  acetaminophen (TYLENOL) 500 MG tablet Take 1,000 mg by mouth every 6 (six) hours as needed for moderate pain.   Yes Historical Provider, MD  albuterol (PROVENTIL HFA;VENTOLIN HFA) 108 (90 BASE) MCG/ACT inhaler Inhale 2 puffs into the lungs every 6 (six) hours as needed for wheezing or shortness of breath  (wheezing).    Yes Historical Provider, MD  folic acid (FOLVITE) 1 MG tablet Take 1 tablet (1 mg total) by mouth daily. 01/15/15  Yes [name], MD  lipase/protease/amylase (CREON) 12000 UNITS CPEP capsule Take 2 capsules (24,000 Units total) by mouth 3 (three) times daily with meals. 01/15/15  Yes [name], MD  loperamide (IMODIUM) 2 MG capsule Take 1-2 capsules (2-4 mg total) by mouth as needed for diarrhea or loose stools. 01/23/14  Yes [name], PA-C  metoprolol tartrate (LOPRESSOR) 25 MG tablet Take 1 tablet (25 mg total) by mouth 2 (two) times daily. 01/15/15  Yes [name], MD  Multiple Vitamin (MULTIVITAMIN) capsule Take 1 capsule by mouth daily. 02/06/14  Yes [name], MD  QUEtiapine (SEROQUEL) 100 MG tablet Take 1 tablet (100 mg total) by mouth at bedtime. For mood control 01/15/15  Yes [name], MD  sertraline (ZOLOFT) 25 MG tablet Take 3 tablets (75 mg total) by mouth daily. 01/15/15  Yes [name], MD  sildenafil (VIAGRA) 50 MG tablet Take 1 tablet (50 mg total) by mouth daily as needed for erectile dysfunction. 01/15/15  Yes Olugbemiga Essie Christine, MD  traMADol (ULTRAM) 50 MG tablet Take 1 tablet (50 mg total) by mouth every 8 (eight) hours as needed. 01/15/15  Yes [name], MD  omeprazole (PRILOSEC) 20 MG capsule Take 1 capsule (20 mg total) by mouth daily. 02/08/15   [name], PA-C  ondansetron (ZOFRAN ODT) 4 MG disintegrating tablet Take 1 tablet (4 mg total) by mouth every 8 (eight) hours as needed for nausea or vomiting. 02/08/15   [name], PA-C   BP 125/83 mmHg  Pulse 84  Temp(Src) 98 F (36.7 C) (Oral)  Resp 18  Ht 5\' 9"  (1.753 m)  Wt 132 lb (59.875 kg)  BMI 19.48 kg/m2  SpO2 99% Physical Exam  Constitutional: He is oriented to person, place, and time. He appears well-developed and well-nourished. No distress.  Nontoxic appearing.  HENT:  Head: Normocephalic and atraumatic.  Mouth/Throat:  Oropharynx is clear and moist.  Eyes: Conjunctivae are normal. Pupils are equal, round, and reactive to light. Right eye exhibits no discharge. Left eye exhibits no discharge.  Neck: Neck supple.  Cardiovascular: Normal rate, regular rhythm, normal heart sounds and intact distal pulses.  Exam reveals no gallop and no friction rub.   Pulmonary/Chest: Effort normal and breath sounds normal. No respiratory distress. He has no wheezes. He has no rales.  Abdominal: Soft. Bowel sounds are normal. He exhibits no distension and no mass. There is tenderness. There is no rebound and no guarding.  Abdomen is soft. Bowel sounds are present. Patient has moderate epigastric and left upper quadrant abdominal tenderness to palpation and mild right upper quadrant abdominal tenderness to palpation. No peritoneal signs. No right lower quadrant tenderness. Negative psoas and obturator sign.   Musculoskeletal: He exhibits no edema or tenderness.  No lower extremity edema or tenderness.  Lymphadenopathy:    He  has no cervical adenopathy.  Neurological: He is alert and oriented to person, place, and time. Coordination normal.  No tremors noted. Alert and oriented x 3.   Skin: Skin is warm and dry. No rash noted. He is not diaphoretic. No erythema. No pallor.  Psychiatric: He has a normal mood and affect. His behavior is normal.  Nursing note and vitals reviewed.   ED Course  Procedures (including critical care time) Labs Review Labs Reviewed  COMPREHENSIVE METABOLIC PANEL - Abnormal; Notable for the following:    Sodium 134 (*)    Glucose, Bld 103 (*)    AST 179 (*)    ALT 81 (*)    Alkaline Phosphatase 145 (*)    All other components within normal limits  CBC - Abnormal; Notable for the following:    RBC 4.02 (*)    Hemoglobin 12.6 (*)    HCT 37.7 (*)    All other components within normal limits  LIPASE, BLOOD  URINALYSIS, ROUTINE W REFLEX MICROSCOPIC (NOT AT [facility])    Imaging Review US Abdomen  Complete  02/08/2015   CLINICAL DATA:  History of pancreatitis. One week history of upper abdominal pain with nausea and vomiting  EXAM: ULTRASOUND ABDOMEN COMPLETE  COMPARISON:  Abdominal MRI December 17, 2014  FINDINGS: Gallbladder: No gallstones or wall thickening visualized. There is no pericholecystic fluid. No sonographic Murphy sign noted.  Common bile duct: Diameter: 4 mm. There is no intrahepatic, common hepatic, or common bile duct dilatation.  Liver: No focal lesion identified. Within normal limits in parenchymal echogenicity.  IVC: No abnormality visualized.  Pancreas: There is inflammatory change in the region of the body of the pancreas. There is a focal area of decreased attenuation in the body of the pancreas measuring 1.1 x 0.9 x 1.2 cm. No other focal lesion identified. There is no pancreatic duct dilatation.  Spleen: Size and appearance within normal limits.  Right Kidney: Length: 11.6 cm. Echogenicity within normal limits. No mass or hydronephrosis visualized.  Left Kidney: Length: 11.9 cm. Echogenicity within normal limits. No mass or hydronephrosis visualized.  Abdominal aorta: No aneurysm visualized.  Other findings: There is trace ascites.  IMPRESSION: Residual inflammatory change in the region of the body the pancreas with a hypoechoic nodular lesion measuring 1.1 x 0.9 x 1.2 cm. Based on the recent MR, suspect inflammatory lesion/small pseudocyst. Given echogenicity within this structure, a small infected pseudocyst cannot be excluded. No other focal pancreatic lesion appreciable by ultrasound.  Trace ascites adjacent to liver.  Study otherwise unremarkable.   Electronically Signed   By: Lowella Grip III M.D.   On: 02/08/2015 09:13   I have personally reviewed and evaluated these images and lab results as part of my medical decision-making.   EKG Interpretation None      Filed Vitals:   02/08/15 0919 02/08/15 0920 02/08/15 0945 02/08/15 1015  BP: 156/82  131/84 125/83  Pulse:   86 79 84  Temp:      TempSrc:      Resp:      Height:      Weight:      SpO2:  97% 99% 99%     MDM   Meds given in ED:  Medications  ondansetron (ZOFRAN-ODT) disintegrating tablet 4 mg (4 mg Oral Given 02/08/15 0606)  sodium chloride 0.9 % bolus 1,000 mL (0 mLs Intravenous Stopped 02/08/15 0920)  HYDROmorphone (DILAUDID) injection 1 mg (1 mg Intravenous Given 02/08/15 0720)  ondansetron (ZOFRAN) injection 4  mg (4 mg Intravenous Given 02/08/15 0721)  gi cocktail (Maalox,Lidocaine,Donnatal) (30 mLs Oral Given 02/08/15 0720)  HYDROmorphone (DILAUDID) injection 0.5 mg (0.5 mg Intravenous Given 02/08/15 0922)  oxyCODONE-acetaminophen (PERCOCET/ROXICET) 5-325 MG per tablet 1 tablet (1 tablet Oral Given 02/08/15 1121)    New Prescriptions   OMEPRAZOLE (PRILOSEC) 20 MG CAPSULE    Take 1 capsule (20 mg total) by mouth daily.   ONDANSETRON (ZOFRAN ODT) 4 MG DISINTEGRATING TABLET    Take 1 tablet (4 mg total) by mouth every 8 (eight) hours as needed for nausea or vomiting.    Final diagnoses:  Alcohol-induced chronic pancreatitis (HCC)  Epigastric pain  Nausea vomiting and diarrhea   This is a 44 y.o. male with a history of alcohol induced pancreatitis who presents to the emergency department complaining of ongoing epigastric and left upper quadrant abdominal pain for the past 4 days. Patient reports he was seen in the [facility] emergency department 2 days ago and was diagnosed with pancreatitis. Patient has a history of pancreatitis due to his chronic alcohol use. Patient reports he's had no alcohol to drink in the past 2 days. He denies symptoms of alcohol withdrawal. The patient also reports he's had nausea, vomiting and diarrhea for the past 2 days. He reports 2 episodes of  NBNB vomiting today and several episodes of watery diarrhea.  On exam the patient is afebrile and nontoxic appearing. Patient's abdomen is soft and he has tenderness in his epigastrium and left upper quadrant. He also has  mild tenderness in his right upper quadrant. No Murphy sign. Patient's urinalysis is negative for infection. Lipase is 51. CMP is remarkable for mildly elevated liver enzymes and an alkaline phosphatase of 145. CBC shows no leukocytosis and a stable hemoglobin and hematocrit. Has patient had persistent nausea vomiting and diarrhea will obtain abdominal ultrasound to visualize his gallbladder. Gallbladder was unremarkable on abdominal ultrasound. It did show some residual inflammatory change in the pancreas with lesions that are likely a small pseudocyst based on his most recent MRI. The abdominal exam is otherwise unremarkable. I advised the patient of this continued pseudocyst in his pancreas. After fluid bolus, nausea and pain medicine the patient tolerated ginger ale and crackers without vomiting. He has had no vomiting or diarrhea since my evaluation in the emergency department. Patient reports feeling well and ready for discharge. We'll discharge the patient a prescription for omeprazole and Zofran and encouraged him to follow-up with primary care. Also advised to return precautions. I advised the patient to follow-up with their primary care provider this week. I advised the patient to return to the emergency department with new or worsening symptoms or new concerns. The patient verbalized understanding and agreement with plan.    This patient was discussed with Dr. Sabra Heck who agrees with assessment and plan.    [name], PA-C 02/08/15 [place], MD 02/09/15 [phone]

## 2015-02-12 ENCOUNTER — Ambulatory Visit: Payer: Self-pay | Admitting: Internal Medicine

## 2015-02-16 ENCOUNTER — Telehealth: Payer: Self-pay | Admitting: General Practice

## 2015-02-16 ENCOUNTER — Emergency Department (HOSPITAL_COMMUNITY)
Admission: EM | Admit: 2015-02-16 | Discharge: 2015-02-16 | Disposition: A | Discharge status: Home / Self Care | Payer: Self-pay | Attending: Emergency Medicine | Admitting: Emergency Medicine

## 2015-02-16 ENCOUNTER — Encounter (HOSPITAL_COMMUNITY): Payer: Self-pay | Admitting: *Deleted

## 2015-02-16 DIAGNOSIS — I10 Essential (primary) hypertension: Secondary | ICD-10-CM | POA: Insufficient documentation

## 2015-02-16 DIAGNOSIS — R229 Localized swelling, mass and lump, unspecified: Secondary | ICD-10-CM | POA: Insufficient documentation

## 2015-02-16 DIAGNOSIS — Z79899 Other long term (current) drug therapy: Secondary | ICD-10-CM | POA: Insufficient documentation

## 2015-02-16 DIAGNOSIS — M1990 Unspecified osteoarthritis, unspecified site: Secondary | ICD-10-CM | POA: Insufficient documentation

## 2015-02-16 DIAGNOSIS — D649 Anemia, unspecified: Secondary | ICD-10-CM | POA: Insufficient documentation

## 2015-02-16 DIAGNOSIS — E782 Mixed hyperlipidemia: Secondary | ICD-10-CM | POA: Insufficient documentation

## 2015-02-16 DIAGNOSIS — R011 Cardiac murmur, unspecified: Secondary | ICD-10-CM | POA: Insufficient documentation

## 2015-02-16 DIAGNOSIS — K861 Other chronic pancreatitis: Secondary | ICD-10-CM | POA: Insufficient documentation

## 2015-02-16 DIAGNOSIS — F329 Major depressive disorder, single episode, unspecified: Secondary | ICD-10-CM | POA: Insufficient documentation

## 2015-02-16 DIAGNOSIS — F419 Anxiety disorder, unspecified: Secondary | ICD-10-CM | POA: Insufficient documentation

## 2015-02-16 DIAGNOSIS — J45909 Unspecified asthma, uncomplicated: Secondary | ICD-10-CM | POA: Insufficient documentation

## 2015-02-16 DIAGNOSIS — K219 Gastro-esophageal reflux disease without esophagitis: Secondary | ICD-10-CM | POA: Insufficient documentation

## 2015-02-16 DIAGNOSIS — Z720 Tobacco use: Secondary | ICD-10-CM | POA: Insufficient documentation

## 2015-02-16 LAB — COMPREHENSIVE METABOLIC PANEL
ALT: 48 U/L (ref 17–63)
ANION GAP: 10 (ref 5–15)
AST: 40 U/L (ref 15–41)
Albumin: 3.4 g/dL — ABNORMAL LOW (ref 3.5–5.0)
Alkaline Phosphatase: 117 U/L (ref 38–126)
BUN: 8 mg/dL (ref 6–20)
CHLORIDE: 106 mmol/L (ref 101–111)
CO2: 24 mmol/L (ref 22–32)
Calcium: 8.9 mg/dL (ref 8.9–10.3)
Creatinine, Ser: 0.82 mg/dL (ref 0.61–1.24)
Glucose, Bld: 91 mg/dL (ref 65–99)
POTASSIUM: 4.4 mmol/L (ref 3.5–5.1)
SODIUM: 140 mmol/L (ref 135–145)
Total Bilirubin: 0.6 mg/dL (ref 0.3–1.2)
Total Protein: 6.8 g/dL (ref 6.5–8.1)

## 2015-02-16 LAB — CBC WITH DIFFERENTIAL/PLATELET
Basophils Absolute: 0 10*3/uL (ref 0.0–0.1)
Basophils Relative: 0 %
EOS ABS: 0.6 10*3/uL (ref 0.0–0.7)
Eosinophils Relative: 7 %
HEMATOCRIT: 36.1 % — AB (ref 39.0–52.0)
HEMOGLOBIN: 11.9 g/dL — AB (ref 13.0–17.0)
LYMPHS ABS: 2.6 10*3/uL (ref 0.7–4.0)
Lymphocytes Relative: 33 %
MCH: 31.2 pg (ref 26.0–34.0)
MCHC: 33 g/dL (ref 30.0–36.0)
MCV: 94.8 fL (ref 78.0–100.0)
MONOS PCT: 9 %
Monocytes Absolute: 0.7 10*3/uL (ref 0.1–1.0)
NEUTROS ABS: 4.1 10*3/uL (ref 1.7–7.7)
NEUTROS PCT: 51 %
Platelets: 222 10*3/uL (ref 150–400)
RBC: 3.81 MIL/uL — AB (ref 4.22–5.81)
RDW: 15.2 % (ref 11.5–15.5)
WBC: 7.9 10*3/uL (ref 4.0–10.5)

## 2015-02-16 LAB — LIPASE, BLOOD: LIPASE: 100 U/L — AB (ref 22–51)

## 2015-02-16 MED ORDER — GI COCKTAIL ~~LOC~~
30.0000 mL | Freq: Once | ORAL | Status: AC
  Administered 2015-02-16: 30 mL via ORAL
  Filled 2015-02-16: qty 30

## 2015-02-16 MED ORDER — ONDANSETRON HCL 4 MG/2ML IJ SOLN
4.0000 mg | Freq: Once | INTRAMUSCULAR | Status: AC
  Administered 2015-02-16: 4 mg via INTRAVENOUS
  Filled 2015-02-16: qty 2

## 2015-02-16 MED ORDER — HYDROMORPHONE HCL 1 MG/ML IJ SOLN
1.0000 mg | Freq: Once | INTRAMUSCULAR | Status: AC
  Administered 2015-02-16: 1 mg via INTRAVENOUS
  Filled 2015-02-16: qty 1

## 2015-02-16 MED ORDER — GI COCKTAIL ~~LOC~~: 30.0000 mL | Freq: Two times a day (BID) | ORAL | Status: DC | PRN

## 2015-02-16 MED ORDER — SODIUM CHLORIDE 0.9 % IV BOLUS (SEPSIS)
1000.0000 mL | Freq: Once | INTRAVENOUS | Status: AC
  Administered 2015-02-16: 1000 mL via INTRAVENOUS

## 2015-02-16 MED ORDER — ONDANSETRON 4 MG PO TBDP: ORAL_TABLET | ORAL | Status: DC

## 2015-02-16 NOTE — ED Notes (Signed)
Per EMS- pt began having upper left abdominal pain for 5 hours and reports a knot in the lower right side for several days. Pt states that he has taken his home meds with no relief

## 2015-02-16 NOTE — Discharge Instructions (Signed)
You need to follow up with your primary doctor in 1-2 weeks for evaluation of the "knot" under your skin. If it grows or becomes painful, come back here or see your doctor sooner.

## 2015-02-16 NOTE — ED Notes (Signed)
Pt placed in gown and in bed. Pt monitored by pulse ox, bp cuff, and 12-lead. 

## 2015-02-16 NOTE — ED Provider Notes (Signed)
CSN: [id number]     Arrival date & time    History   First MD Initiated Contact with Patient 02/16/15 0846     Chief Complaint  Patient presents with  . Abdominal Pain     (Consider location/radiation/quality/duration/timing/severity/associated sxs/prior Treatment) HPI  44 year old male presents with recurrent upper abdominal pain somewhat prior pancreatitis. States started this morning at 3 AM. States he last drank 7 days ago. Has had nausea, vomiting, and diarrhea. Also noticed a knot in his right lower abdomen. It is not tender. When he was feeling his abdomen a few days ago he noticed it. Not growing in size.   Past Medical History  Diagnosis Date  . Hypertension   . Asthma   . Pancreatitis   . Cocaine abuse   . Depression   . H/O suicide attempt   . Heart murmur     "when he was little" (03/06/2013)  . Shortness of breath     "can happen at anytime" (03/06/2013)  . Anemia   . H/O hiatal hernia   . GERD (gastroesophageal reflux disease)   . Anxiety   . WPW (Wolff-Parkinson-White syndrome)     [name] 03/06/2013  . High cholesterol   . Migraine     "monthly" (01/30/2014)  . Arthritis     "knees; arms; elbows" (01/30/2014)  . Femoral condyle fracture ([place]) 03/08/2014    left medial/notes 03/09/2014  . Alcoholism /alcohol abuse [facility])    Past Surgical History  Procedure Laterality Date  . Facial fracture surgery Left 1990's    "result of trauma"   . Eye surgery Left 1990's    "result of trauma"   . Left heart catheterization with coronary angiogram Right 03/07/2013    Procedure: LEFT HEART CATHETERIZATION WITH CORONARY ANGIOGRAM;  Surgeon: [name], MD;  Location: [facility]ATH [facility];  Service: Cardiovascular;  Laterality: Right;   Family History  Problem Relation Age of Onset  . Hypertension Other   . Coronary artery disease Other    Social History  Substance Use Topics  . Smoking status: Current Every Day Smoker -- 1.00 packs/day for 30 years    Types: Cigarettes   . Smokeless tobacco: Current User    Types: Chew  . Alcohol Use: 10.8 oz/week    18 Cans of beer per week     Comment: Last drink Thursday 12/11/14 per patient    Review of Systems  Constitutional: Negative for fever.  Respiratory: Negative for shortness of breath.   Cardiovascular: Negative for chest pain.  Gastrointestinal: Positive for nausea, vomiting, abdominal pain and diarrhea.  All other systems reviewed and are negative.     Allergies  Shellfish-derived products and Trazodone and nefazodone  Home Medications   Prior to Admission medications   Medication Sig Start Date End Date Taking? Authorizing Provider  acetaminophen (TYLENOL) 500 MG tablet Take 1,000 mg by mouth every 6 (six) hours as needed for moderate pain.   Yes Historical Provider, MD  albuterol (PROVENTIL HFA;VENTOLIN HFA) 108 (90 BASE) MCG/ACT inhaler Inhale 2 puffs into the lungs every 6 (six) hours as needed for wheezing or shortness of breath (wheezing).    Yes Historical Provider, MD  folic acid (FOLVITE) 1 MG tablet Take 1 tablet (1 mg total) by mouth daily. 01/15/15  Yes [name], MD  lipase/protease/amylase (CREON) 12000 UNITS CPEP capsule Take 2 capsules (24,000 Units total) by mouth 3 (three) times daily with meals. 01/15/15  Yes [name], MD  loperamide (IMODIUM) 2  MG capsule Take 1-2 capsules (2-4 mg total) by mouth as needed for diarrhea or loose stools. 01/23/14  Yes [name], PA-C  metoprolol tartrate (LOPRESSOR) 25 MG tablet Take 1 tablet (25 mg total) by mouth 2 (two) times daily. 01/15/15  Yes [name], MD  Multiple Vitamin (MULTIVITAMIN) capsule Take 1 capsule by mouth daily. 02/06/14  Yes [name], MD  omeprazole (PRILOSEC) 20 MG capsule Take 1 capsule (20 mg total) by mouth daily. 02/08/15  Yes [name], PA-C  ondansetron (ZOFRAN ODT) 4 MG disintegrating tablet Take 1 tablet (4 mg total) by mouth every 8 (eight) hours as needed for nausea or  vomiting. 02/08/15  Yes [name], PA-C  QUEtiapine (SEROQUEL) 50 MG tablet Take 100 mg by mouth at bedtime. 01/15/15  Yes Historical Provider, MD  sertraline (ZOLOFT) 25 MG tablet Take 3 tablets (75 mg total) by mouth daily. 01/15/15  Yes [name], MD  sildenafil (VIAGRA) 50 MG tablet Take 1 tablet (50 mg total) by mouth daily as needed for erectile dysfunction. 01/15/15  Yes Olugbemiga Essie Christine, MD  traMADol (ULTRAM) 50 MG tablet Take 1 tablet (50 mg total) by mouth every 8 (eight) hours as needed. 01/15/15  Yes Olugbemiga E Doreene Burke, MD   BP 122/78 mmHg  Pulse 73  Temp(Src) 98.7 F (37.1 C) (Oral)  Resp 14  SpO2 100% Physical Exam  Constitutional: He is oriented to person, place, and time. He appears well-developed and well-nourished.  HENT:  Head: Normocephalic and atraumatic.  Right Ear: External ear normal.  Left Ear: External ear normal.  Nose: Nose normal.  Eyes: Right eye exhibits no discharge. Left eye exhibits no discharge.  Neck: Neck supple.  Cardiovascular: Normal rate, regular rhythm, normal heart sounds and intact distal pulses.   Pulmonary/Chest: Effort normal and breath sounds normal.  Abdominal: Soft. There is tenderness in the epigastric area.    Musculoskeletal: He exhibits no edema.  Lymphadenopathy:       Right: No inguinal adenopathy present.       Left: No inguinal adenopathy present.  Neurological: He is alert and oriented to person, place, and time.  Skin: Skin is warm and dry.  Nursing note and vitals reviewed.   ED Course  Procedures (including critical care time) Labs Review Labs Reviewed  COMPREHENSIVE METABOLIC PANEL - Abnormal; Notable for the following:    Albumin 3.4 (*)    All other components within normal limits  CBC WITH DIFFERENTIAL/PLATELET - Abnormal; Notable for the following:    RBC 3.81 (*)    Hemoglobin 11.9 (*)    HCT 36.1 (*)    All other components within normal limits  LIPASE, BLOOD - Abnormal; Notable for the  following:    Lipase 100 (*)    All other components within normal limits    Imaging Review No results found. I have personally reviewed and evaluated these images and lab results as part of my medical decision-making.   EKG Interpretation None      MDM   Final diagnoses:  Chronic pancreatitis, unspecified pancreatitis type ([place])  Subcutaneous nodule    Patient with recurrent pancreatitis. Pain has improved. He is requesting a GI cocktail to go home with. We'll also give antiepileptics. Given this a chronic situation and do not feel narcotics are indicated. As for this "mass" just under skin in his right lower abdomen. This is not in his groin to be consistent with lymphadenopathy. It is unclear what it is but  is not tender does not seem like an abscess. I did not feel CT imaging would be helpful to this time. Does not seem related to an acute emergent process, recommend he follow the size and see his PCP in one week for a recheck and possible further evaluation then.     Sherwood Gambler, MD 02/16/15 1630

## 2015-02-16 NOTE — Telephone Encounter (Signed)
Patient presents to clinic to schedule HFU.  Appointment scheduled. Patient now requesting medication refill Tramadol. Please assist

## 2015-02-17 ENCOUNTER — Other Ambulatory Visit: Payer: Self-pay | Admitting: *Deleted

## 2015-02-17 DIAGNOSIS — R1013 Epigastric pain: Principal | ICD-10-CM

## 2015-02-17 DIAGNOSIS — G8929 Other chronic pain: Secondary | ICD-10-CM

## 2015-02-17 MED ORDER — TRAMADOL HCL 50 MG PO TABS: 50.0000 mg | ORAL_TABLET | Freq: Three times a day (TID) | ORAL | Status: DC | PRN

## 2015-02-18 NOTE — Telephone Encounter (Signed)
Patient's mother became argumentative regarding picking up patients prescription. Medical assistant informed person of protocol and patient not having authorization listed in his file to discuss with any other person. Mother stated she has been speaking and picking up items for patient. Medical Assistant repeated protocol and asked person to have patient return the phone call at his convienece.

## 2015-02-26 ENCOUNTER — Ambulatory Visit: Payer: Self-pay | Admitting: Internal Medicine

## 2015-02-28 ENCOUNTER — Emergency Department (HOSPITAL_COMMUNITY)
Admission: EM | Admit: 2015-02-28 | Discharge: 2015-02-28 | Disposition: A | Discharge status: Home / Self Care | Payer: Self-pay | Attending: Emergency Medicine | Admitting: Emergency Medicine

## 2015-02-28 ENCOUNTER — Encounter (HOSPITAL_COMMUNITY): Payer: Self-pay

## 2015-02-28 DIAGNOSIS — Z720 Tobacco use: Secondary | ICD-10-CM | POA: Insufficient documentation

## 2015-02-28 DIAGNOSIS — D649 Anemia, unspecified: Secondary | ICD-10-CM | POA: Insufficient documentation

## 2015-02-28 DIAGNOSIS — I10 Essential (primary) hypertension: Secondary | ICD-10-CM | POA: Insufficient documentation

## 2015-02-28 DIAGNOSIS — Z79899 Other long term (current) drug therapy: Secondary | ICD-10-CM | POA: Insufficient documentation

## 2015-02-28 DIAGNOSIS — Z8781 Personal history of (healed) traumatic fracture: Secondary | ICD-10-CM | POA: Insufficient documentation

## 2015-02-28 DIAGNOSIS — Z8639 Personal history of other endocrine, nutritional and metabolic disease: Secondary | ICD-10-CM | POA: Insufficient documentation

## 2015-02-28 DIAGNOSIS — J45909 Unspecified asthma, uncomplicated: Secondary | ICD-10-CM | POA: Insufficient documentation

## 2015-02-28 DIAGNOSIS — Z8739 Personal history of other diseases of the musculoskeletal system and connective tissue: Secondary | ICD-10-CM | POA: Insufficient documentation

## 2015-02-28 DIAGNOSIS — R109 Unspecified abdominal pain: Secondary | ICD-10-CM

## 2015-02-28 DIAGNOSIS — K219 Gastro-esophageal reflux disease without esophagitis: Secondary | ICD-10-CM | POA: Insufficient documentation

## 2015-02-28 DIAGNOSIS — R1013 Epigastric pain: Secondary | ICD-10-CM | POA: Insufficient documentation

## 2015-02-28 DIAGNOSIS — R011 Cardiac murmur, unspecified: Secondary | ICD-10-CM | POA: Insufficient documentation

## 2015-02-28 DIAGNOSIS — R1012 Left upper quadrant pain: Secondary | ICD-10-CM | POA: Insufficient documentation

## 2015-02-28 DIAGNOSIS — F419 Anxiety disorder, unspecified: Secondary | ICD-10-CM | POA: Insufficient documentation

## 2015-02-28 DIAGNOSIS — R197 Diarrhea, unspecified: Secondary | ICD-10-CM | POA: Insufficient documentation

## 2015-02-28 DIAGNOSIS — R112 Nausea with vomiting, unspecified: Secondary | ICD-10-CM | POA: Insufficient documentation

## 2015-02-28 DIAGNOSIS — F329 Major depressive disorder, single episode, unspecified: Secondary | ICD-10-CM | POA: Insufficient documentation

## 2015-02-28 LAB — COMPREHENSIVE METABOLIC PANEL
ALBUMIN: 3.8 g/dL (ref 3.5–5.0)
ALT: 54 U/L (ref 17–63)
ANION GAP: 11 (ref 5–15)
AST: 67 U/L — ABNORMAL HIGH (ref 15–41)
Alkaline Phosphatase: 124 U/L (ref 38–126)
BUN: 7 mg/dL (ref 6–20)
CO2: 24 mmol/L (ref 22–32)
Calcium: 9.1 mg/dL (ref 8.9–10.3)
Chloride: 100 mmol/L — ABNORMAL LOW (ref 101–111)
Creatinine, Ser: 0.76 mg/dL (ref 0.61–1.24)
GFR calc Af Amer: >60 mL/min (ref >60)
GFR calc non Af Amer: >60 mL/min (ref >60)
GLUCOSE: 112 mg/dL — AB (ref 65–99)
POTASSIUM: 3.9 mmol/L (ref 3.5–5.1)
SODIUM: 135 mmol/L (ref 135–145)
TOTAL PROTEIN: 7.6 g/dL (ref 6.5–8.1)
Total Bilirubin: 1 mg/dL (ref 0.3–1.2)

## 2015-02-28 LAB — CBC
HEMATOCRIT: 40.3 % (ref 39.0–52.0)
HEMOGLOBIN: 13.7 g/dL (ref 13.0–17.0)
MCH: 31.9 pg (ref 26.0–34.0)
MCHC: 34 g/dL (ref 30.0–36.0)
MCV: 93.9 fL (ref 78.0–100.0)
Platelets: 238 10*3/uL (ref 150–400)
RBC: 4.29 MIL/uL (ref 4.22–5.81)
RDW: 14.9 % (ref 11.5–15.5)
WBC: 6.7 10*3/uL (ref 4.0–10.5)

## 2015-02-28 LAB — URINALYSIS, ROUTINE W REFLEX MICROSCOPIC
BILIRUBIN URINE: NEGATIVE
GLUCOSE, UA: NEGATIVE mg/dL
HGB URINE DIPSTICK: NEGATIVE
Ketones, ur: NEGATIVE mg/dL
Leukocytes, UA: NEGATIVE
Nitrite: NEGATIVE
PROTEIN: NEGATIVE mg/dL
Specific Gravity, Urine: 1.019 (ref 1.005–1.030)
UROBILINOGEN UA: 1 mg/dL (ref 0.0–1.0)
pH: 6.5 (ref 5.0–8.0)

## 2015-02-28 LAB — LIPASE, BLOOD: Lipase: 79 U/L — ABNORMAL HIGH (ref 11–51)

## 2015-02-28 MED ORDER — PROMETHAZINE HCL 25 MG/ML IJ SOLN
25.0000 mg | Freq: Once | INTRAMUSCULAR | Status: AC
  Administered 2015-02-28: 25 mg via INTRAVENOUS
  Filled 2015-02-28: qty 1

## 2015-02-28 MED ORDER — PANTOPRAZOLE SODIUM 20 MG PO TBEC: 20.0000 mg | DELAYED_RELEASE_TABLET | Freq: Every day | ORAL | Status: DC

## 2015-02-28 MED ORDER — MORPHINE SULFATE (PF) 4 MG/ML IV SOLN
4.0000 mg | Freq: Once | INTRAVENOUS | Status: AC
  Administered 2015-02-28: 4 mg via INTRAVENOUS
  Filled 2015-02-28: qty 1

## 2015-02-28 MED ORDER — GI COCKTAIL ~~LOC~~
30.0000 mL | Freq: Once | ORAL | Status: AC
  Administered 2015-02-28: 30 mL via ORAL
  Filled 2015-02-28: qty 30

## 2015-02-28 MED ORDER — SODIUM CHLORIDE 0.9 % IV BOLUS (SEPSIS)
1000.0000 mL | Freq: Once | INTRAVENOUS | Status: AC
  Administered 2015-02-28: 1000 mL via INTRAVENOUS

## 2015-02-28 MED ORDER — ONDANSETRON HCL 4 MG/2ML IJ SOLN
4.0000 mg | Freq: Once | INTRAMUSCULAR | Status: AC | PRN
  Administered 2015-02-28: 4 mg via INTRAVENOUS
  Filled 2015-02-28 (×2): qty 2

## 2015-02-28 MED ORDER — ONDANSETRON 4 MG PO TBDP: 4.0000 mg | ORAL_TABLET | Freq: Three times a day (TID) | ORAL | Status: DC | PRN

## 2015-02-28 MED ORDER — OXYCODONE-ACETAMINOPHEN 5-325 MG PO TABS: 2.0000 | ORAL_TABLET | ORAL | Status: DC | PRN

## 2015-02-28 MED ORDER — HYDROMORPHONE HCL 1 MG/ML IJ SOLN
1.0000 mg | Freq: Once | INTRAMUSCULAR | Status: AC
  Administered 2015-02-28: 1 mg via INTRAVENOUS
  Filled 2015-02-28: qty 1

## 2015-02-28 NOTE — Discharge Instructions (Signed)
1. Medications: pain medication, zofran (for nausea), protonix, usual home medications 2. Treatment: rest, drink plenty of fluids, stop drinking alcohol, avoid eating fatty foods 3. Follow Up: please followup with your primary doctor this week for discussion of your diagnoses and further evaluation after today's visit; please return to the ER for high fever, severe pain, persistent vomiting, new or worsening symptoms   Abdominal Pain, Adult Many things can cause abdominal pain. Usually, abdominal pain is not caused by a disease and will improve without treatment. It can often be observed and treated at home. Your health care provider will do a physical exam and possibly order blood tests and X-rays to help determine the seriousness of your pain. However, in many cases, more time must pass before a clear cause of the pain can be found. Before that point, your health care provider may not know if you need more testing or further treatment. HOME CARE INSTRUCTIONS Monitor your abdominal pain for any changes. The following actions may help to alleviate any discomfort you are experiencing:  Only take over-the-counter or prescription medicines as directed by your health care provider.  Do not take laxatives unless directed to do so by your health care provider.  Try a clear liquid diet (broth, tea, or water) as directed by your health care provider. Slowly move to a bland diet as tolerated. SEEK MEDICAL CARE IF:  You have unexplained abdominal pain.  You have abdominal pain associated with nausea or diarrhea.  You have pain when you urinate or have a bowel movement.  You experience abdominal pain that wakes you in the night.  You have abdominal pain that is worsened or improved by eating food.  You have abdominal pain that is worsened with eating fatty foods.  You have a fever. SEEK IMMEDIATE MEDICAL CARE IF:  Your pain does not go away within 2 hours.  You keep throwing up  (vomiting).  Your pain is felt only in portions of the abdomen, such as the right side or the left lower portion of the abdomen.  You pass bloody or black tarry stools. MAKE SURE YOU:  Understand these instructions.  Will watch your condition.  Will get help right away if you are not doing well or get worse.   This information is not intended to replace advice given to you by your health care provider. Make sure you discuss any questions you have with your health care provider.   Document Released: 02/02/2005 Document Revised: 01/14/2015 Document Reviewed: 01/02/2013 Elsevier Interactive Patient Education [employer].

## 2015-02-28 NOTE — ED Notes (Signed)
Pt. Woke up with abdominal pain and vomited X2.  Hx of pancreatitis.  Pt. Ate Fried chicken last night.  Pt. Also took Oxycodone this am but he thinks he has thrown it up.    Alert and oriented X4

## 2015-02-28 NOTE — ED Notes (Signed)
Pt tolerated PO food and fluids

## 2015-02-28 NOTE — ED Provider Notes (Signed)
CSN: [id number]     Arrival date & time 02/28/15  1016 History   First MD Initiated Contact with Patient 02/28/15 1023     Chief Complaint  Patient presents with  . Abdominal Pain     HPI   [name] is a 44 y.o. male with a PMH of hypertension, alcohol abuse, depression, GERD, pancreatitis who presents to the ED with epigastric abdominal pain. He states his pain woke him up from sleep this morning and has been constant since that time. He denies exacerbating factors. He has tried his home pain medication without symptom relief. He states he ate fried chicken last night, and thinks this may have triggered his pain. He reports his symptoms are consistent with his pancreatitis flares. He denies fever, chills, chest pain, shortness of breath. He reports nausea and 2 episodes of nonbloody emesis. He also reports loose stools this morning. He denies hematochezia or melena. He states he is trying to cut down on alcohol take. He reports his last alcohol use was 2 days ago and he had a 6 pack.     Past Medical History  Diagnosis Date  . Hypertension   . Asthma   . Pancreatitis   . Cocaine abuse   . Depression   . H/O suicide attempt   . Heart murmur     "when he was little" (03/06/2013)  . Shortness of breath     "can happen at anytime" (03/06/2013)  . Anemia   . H/O hiatal hernia   . GERD (gastroesophageal reflux disease)   . Anxiety   . WPW (Wolff-Parkinson-White syndrome)     [name] 03/06/2013  . High cholesterol   . Migraine     "monthly" (01/30/2014)  . Arthritis     "knees; arms; elbows" (01/30/2014)  . Femoral condyle fracture ([place]) 03/08/2014    left medial/notes 03/09/2014  . Alcoholism /alcohol abuse [facility])    Past Surgical History  Procedure Laterality Date  . Facial fracture surgery Left 1990's    "result of trauma"   . Eye surgery Left 1990's    "result of trauma"   . Left heart catheterization with coronary angiogram Right 03/07/2013    Procedure: LEFT HEART  CATHETERIZATION WITH CORONARY ANGIOGRAM;  Surgeon: [name], MD;  Location: [place] [facility] CATH LAB;  Service: Cardiovascular;  Laterality: Right;   Family History  Problem Relation Age of Onset  . Hypertension Other   . Coronary artery disease Other    Social History  Substance Use Topics  . Smoking status: Current Every Day Smoker -- 1.00 packs/day for 30 years    Types: Cigarettes  . Smokeless tobacco: Current User    Types: Chew  . Alcohol Use: 10.8 oz/week    18 Cans of beer per week     Comment: Last drink Thursday 12/11/14 per patient      Review of Systems  Constitutional: Negative for fever and chills.  Respiratory: Negative for shortness of breath.   Cardiovascular: Negative for chest pain.  Gastrointestinal: Positive for nausea, vomiting, abdominal pain and diarrhea. Negative for constipation and blood in stool.  Genitourinary: Negative for dysuria, urgency and frequency.  Neurological: Negative for dizziness, syncope, weakness, light-headedness, numbness and headaches.  All other systems reviewed and are negative.     Allergies  Shellfish-derived products and Trazodone and nefazodone  Home Medications   Prior to Admission medications   Medication Sig Start Date End Date Taking? Authorizing Provider  acetaminophen (TYLENOL) 500 MG tablet Take  1,000 mg by mouth every 6 (six) hours as needed for moderate pain.    Historical Provider, MD  albuterol (PROVENTIL HFA;VENTOLIN HFA) 108 (90 BASE) MCG/ACT inhaler Inhale 2 puffs into the lungs every 6 (six) hours as needed for wheezing or shortness of breath (wheezing).     Historical Provider, MD  Alum & Mag Hydroxide-Simeth (GI COCKTAIL) SUSP suspension Take 30 mLs by mouth 2 (two) times daily as needed for indigestion. Shake well. 02/16/15   Sherwood Gambler, MD  folic acid (FOLVITE) 1 MG tablet Take 1 tablet (1 mg total) by mouth daily. 01/15/15   [name], MD  lipase/protease/amylase (CREON) 12000 UNITS CPEP capsule Take  2 capsules (24,000 Units total) by mouth 3 (three) times daily with meals. 01/15/15   [name], MD  loperamide (IMODIUM) 2 MG capsule Take 1-2 capsules (2-4 mg total) by mouth as needed for diarrhea or loose stools. 01/23/14   [name], PA-C  metoprolol tartrate (LOPRESSOR) 25 MG tablet Take 1 tablet (25 mg total) by mouth 2 (two) times daily. 01/15/15   [name], MD  Multiple Vitamin (MULTIVITAMIN) capsule Take 1 capsule by mouth daily. 02/06/14   [name], MD  omeprazole (PRILOSEC) 20 MG capsule Take 1 capsule (20 mg total) by mouth daily. 02/08/15   [name], PA-C  ondansetron (ZOFRAN ODT) 4 MG disintegrating tablet 4mg  ODT q4 hours prn nausea/vomit 02/16/15   Sherwood Gambler, MD  QUEtiapine (SEROQUEL) 50 MG tablet Take 100 mg by mouth at bedtime. 01/15/15   Historical Provider, MD  sertraline (ZOLOFT) 25 MG tablet Take 3 tablets (75 mg total) by mouth daily. 01/15/15   [name], MD  sildenafil (VIAGRA) 50 MG tablet Take 1 tablet (50 mg total) by mouth daily as needed for erectile dysfunction. 01/15/15   [name], MD  traMADol (ULTRAM) 50 MG tablet Take 1 tablet (50 mg total) by mouth every 8 (eight) hours as needed. 02/17/15   [name], MD    BP 145/88 mmHg  Pulse 93  Temp(Src) 99.4 F (37.4 C) (Oral)  Resp 18  Ht 5\' 9"  (1.753 m)  Wt 140 lb (63.504 kg)  BMI 20.67 kg/m2  SpO2 100% Physical Exam  Constitutional: He is oriented to person, place, and time. He appears well-developed and well-nourished. He appears distressed.  Patient in mild distress due to pain.  HENT:  Head: Normocephalic and atraumatic.  Right Ear: External ear normal.  Left Ear: External ear normal.  Nose: Nose normal.  Mouth/Throat: Uvula is midline, oropharynx is clear and moist and mucous membranes are normal.  Eyes: Conjunctivae, EOM and lids are normal. Pupils are equal, round, and reactive to light. Right eye exhibits no discharge. Left eye  exhibits no discharge. No scleral icterus.  Neck: Normal range of motion. Neck supple.  Cardiovascular: Normal rate, regular rhythm, normal heart sounds, intact distal pulses and normal pulses.   Pulmonary/Chest: Effort normal and breath sounds normal. No respiratory distress.  Abdominal: Soft. Normal appearance and bowel sounds are normal. He exhibits no distension and no mass. There is tenderness. There is no rigidity, no rebound and no guarding.  Tenderness to palpation in epigastrium and left upper quadrant. No rebound, guarding, or palpable masses.  Musculoskeletal: Normal range of motion. He exhibits no edema or tenderness.  Neurological: He is alert and oriented to person, place, and time.  Skin: Skin is warm, dry and intact. No rash noted. He is not diaphoretic. No erythema.  No pallor.  Psychiatric: He has a normal mood and affect. His speech is normal and behavior is normal.  Nursing note and vitals reviewed.   ED Course  Procedures (including critical care time)  Labs Review Labs Reviewed  LIPASE, BLOOD - Abnormal; Notable for the following:    Lipase 79 (*)    All other components within normal limits  COMPREHENSIVE METABOLIC PANEL - Abnormal; Notable for the following:    Chloride 100 (*)    Glucose, Bld 112 (*)    AST 67 (*)    All other components within normal limits  CBC  URINALYSIS, ROUTINE W REFLEX MICROSCOPIC (NOT AT [facility])    Imaging Review No results found.   I have personally reviewed and evaluated these images and lab results as part of my medical decision-making.   EKG Interpretation   Date/Time:  Saturday February 28 2015 10:19:46 EDT Ventricular Rate:  94 PR Interval:  101 QRS Duration: 74 QT Interval:  326 QTC Calculation: 408 R Axis:   81 Text Interpretation:  Sinus rhythm Short PR interval Right atrial  enlargement Borderline repolarization abnormality No significant change  since last tracing Confirmed by Maryan Rued  MD, Loree Fee ([id number]) on   02/28/2015 10:25:33 AM      MDM   Final diagnoses:  Abdominal pain, unspecified abdominal location  Non-intractable vomiting with nausea, vomiting of unspecified type    44 year old male presents with abdominal pain. States his symptoms are consistent with his flares of pancreatitis. Denies fever, chills, chest pain, shortness of breath. Reports nausea, 2 episodes of nonbloody emesis, and loose stools this morning. Denies hematochezia or melena. States he is trying to cut down on alcohol take; his last alcohol use was 2 days ago, at which time he had a 6 pack.   Patient is afebrile. Vital signs stable. Heart regular rate and rhythm. Lungs clear to auscultation bilaterally. Abdomen soft, nondistended, with mild tenderness to palpation in epigastrium and left upper quadrant. No rebound, guarding, or masses. CBC negative for leukocytosis. CMP remarkable for mildly elevated AST at 67. Lipase 79, which appears down trending from prior. UA negative for infection.  Pain treated in the ED. Patient given fluids, antiemetics, and GI cocktail. Patient reports significant improvement in symptoms. He is able to tolerate PO intake. Do not feel imaging is indicated at this time, given patient's symptoms are consistent with his chronic pancreatitis and his lipase is down-trending (he was evaluated in the ED 10/10 for the same symptoms and subsequently discharged). Patient is well-appearing, feel he is stable for discharge at this time. Will treat with short course of pain medication, protonix, and zofran. Patient to follow-up with PCP for further evaluation and management. Return precautions discussed at length.  BP 145/88 mmHg  Pulse 93  Temp(Src) 99.4 F (37.4 C) (Oral)  Resp 18  Ht 5\' 9"  (1.753 m)  Wt 140 lb (63.504 kg)  BMI 20.67 kg/m2  SpO2 100%    Marella Chimes, PA-C 02/28/15 [place], MD 02/28/15 [phone]

## 2015-03-02 ENCOUNTER — Encounter (HOSPITAL_COMMUNITY): Payer: Self-pay | Admitting: Emergency Medicine

## 2015-03-02 ENCOUNTER — Emergency Department (HOSPITAL_COMMUNITY)
Admission: EM | Admit: 2015-03-02 | Discharge: 2015-03-02 | Disposition: A | Discharge status: Home / Self Care | Payer: Self-pay | Attending: Emergency Medicine | Admitting: Emergency Medicine

## 2015-03-02 DIAGNOSIS — F17210 Nicotine dependence, cigarettes, uncomplicated: Secondary | ICD-10-CM | POA: Insufficient documentation

## 2015-03-02 DIAGNOSIS — R011 Cardiac murmur, unspecified: Secondary | ICD-10-CM | POA: Insufficient documentation

## 2015-03-02 DIAGNOSIS — F419 Anxiety disorder, unspecified: Secondary | ICD-10-CM | POA: Insufficient documentation

## 2015-03-02 DIAGNOSIS — F329 Major depressive disorder, single episode, unspecified: Secondary | ICD-10-CM | POA: Insufficient documentation

## 2015-03-02 DIAGNOSIS — M158 Other polyosteoarthritis: Secondary | ICD-10-CM | POA: Insufficient documentation

## 2015-03-02 DIAGNOSIS — Z8781 Personal history of (healed) traumatic fracture: Secondary | ICD-10-CM | POA: Insufficient documentation

## 2015-03-02 DIAGNOSIS — Z79899 Other long term (current) drug therapy: Secondary | ICD-10-CM | POA: Insufficient documentation

## 2015-03-02 DIAGNOSIS — J45909 Unspecified asthma, uncomplicated: Secondary | ICD-10-CM | POA: Insufficient documentation

## 2015-03-02 DIAGNOSIS — D649 Anemia, unspecified: Secondary | ICD-10-CM | POA: Insufficient documentation

## 2015-03-02 DIAGNOSIS — K2970 Gastritis, unspecified, without bleeding: Secondary | ICD-10-CM | POA: Insufficient documentation

## 2015-03-02 DIAGNOSIS — I10 Essential (primary) hypertension: Secondary | ICD-10-CM | POA: Insufficient documentation

## 2015-03-02 DIAGNOSIS — Z8639 Personal history of other endocrine, nutritional and metabolic disease: Secondary | ICD-10-CM | POA: Insufficient documentation

## 2015-03-02 DIAGNOSIS — Z9889 Other specified postprocedural states: Secondary | ICD-10-CM | POA: Insufficient documentation

## 2015-03-02 DIAGNOSIS — K219 Gastro-esophageal reflux disease without esophagitis: Secondary | ICD-10-CM | POA: Insufficient documentation

## 2015-03-02 LAB — URINALYSIS, ROUTINE W REFLEX MICROSCOPIC
Bilirubin Urine: NEGATIVE
GLUCOSE, UA: NEGATIVE mg/dL
HGB URINE DIPSTICK: NEGATIVE
KETONES UR: NEGATIVE mg/dL
Nitrite: NEGATIVE
PROTEIN: NEGATIVE mg/dL
Specific Gravity, Urine: 1.017 (ref 1.005–1.030)
UROBILINOGEN UA: 0.2 mg/dL (ref 0.0–1.0)
pH: 7.5 (ref 5.0–8.0)

## 2015-03-02 LAB — COMPREHENSIVE METABOLIC PANEL
ALBUMIN: 4.4 g/dL (ref 3.5–5.0)
ALT: 51 U/L (ref 17–63)
ANION GAP: 8 (ref 5–15)
AST: 68 U/L — ABNORMAL HIGH (ref 15–41)
Alkaline Phosphatase: 119 U/L (ref 38–126)
BUN: 8 mg/dL (ref 6–20)
CO2: 27 mmol/L (ref 22–32)
Calcium: 9.4 mg/dL (ref 8.9–10.3)
Chloride: 103 mmol/L (ref 101–111)
Creatinine, Ser: 0.68 mg/dL (ref 0.61–1.24)
GFR calc Af Amer: >60 mL/min (ref >60)
GFR calc non Af Amer: >60 mL/min (ref >60)
GLUCOSE: 84 mg/dL (ref 65–99)
POTASSIUM: 4.2 mmol/L (ref 3.5–5.1)
SODIUM: 138 mmol/L (ref 135–145)
TOTAL PROTEIN: 8.7 g/dL — AB (ref 6.5–8.1)
Total Bilirubin: 1.1 mg/dL (ref 0.3–1.2)

## 2015-03-02 LAB — CBC
HCT: 38.7 % — ABNORMAL LOW (ref 39.0–52.0)
HEMOGLOBIN: 13.1 g/dL (ref 13.0–17.0)
MCH: 31.7 pg (ref 26.0–34.0)
MCHC: 33.9 g/dL (ref 30.0–36.0)
MCV: 93.7 fL (ref 78.0–100.0)
PLATELETS: 217 10*3/uL (ref 150–400)
RBC: 4.13 MIL/uL — ABNORMAL LOW (ref 4.22–5.81)
RDW: 14.9 % (ref 11.5–15.5)
WBC: 11.5 10*3/uL — ABNORMAL HIGH (ref 4.0–10.5)

## 2015-03-02 LAB — URINE MICROSCOPIC-ADD ON

## 2015-03-02 LAB — LIPASE, BLOOD: Lipase: 51 U/L (ref 11–51)

## 2015-03-02 MED ORDER — OXYCODONE-ACETAMINOPHEN 5-325 MG PO TABS
2.0000 | ORAL_TABLET | Freq: Once | ORAL | Status: AC
  Administered 2015-03-02: 2 via ORAL
  Filled 2015-03-02: qty 2

## 2015-03-02 MED ORDER — FAMOTIDINE 20 MG PO TABS: 20.0000 mg | ORAL_TABLET | Freq: Two times a day (BID) | ORAL | Status: DC

## 2015-03-02 MED ORDER — SUCRALFATE 1 G PO TABS: 1.0000 g | ORAL_TABLET | Freq: Three times a day (TID) | ORAL | Status: DC

## 2015-03-02 MED ORDER — FAMOTIDINE 20 MG PO TABS
20.0000 mg | ORAL_TABLET | Freq: Once | ORAL | Status: AC
  Administered 2015-03-02: 20 mg via ORAL
  Filled 2015-03-02: qty 1

## 2015-03-02 MED ORDER — GI COCKTAIL ~~LOC~~
30.0000 mL | Freq: Once | ORAL | Status: AC
  Administered 2015-03-02: 30 mL via ORAL
  Filled 2015-03-02: qty 30

## 2015-03-02 MED ORDER — ONDANSETRON 8 MG PO TBDP
8.0000 mg | ORAL_TABLET | Freq: Once | ORAL | Status: AC
  Administered 2015-03-02: 8 mg via ORAL
  Filled 2015-03-02: qty 1

## 2015-03-02 NOTE — ED Provider Notes (Signed)
CSN: [id number]     Arrival date & time 03/02/15  1848 History   First MD Initiated Contact with Patient 03/02/15 2036     Chief Complaint  Patient presents with  . Pancreatitis     (Consider location/radiation/quality/duration/timing/severity/associated sxs/prior Treatment) HPI Comments: Patient presents to the emergency department for evaluation of abdominal pain. Patient reports constant epigastric abdominal pain with nausea but no vomiting. Patient has a history of pancreatitis, is concerned that this is related to his pancreatitis. He does have a history of alcohol abuse. He reports his last alcohol intake was 3 days ago. Patient has not noticed any rectal bleeding or melena.   Past Medical History  Diagnosis Date  . Hypertension   . Asthma   . Pancreatitis   . Cocaine abuse   . Depression   . H/O suicide attempt   . Heart murmur     "when he was little" (03/06/2013)  . Shortness of breath     "can happen at anytime" (03/06/2013)  . Anemia   . H/O hiatal hernia   . GERD (gastroesophageal reflux disease)   . Anxiety   . WPW (Wolff-Parkinson-White syndrome)     [name] 03/06/2013  . High cholesterol   . Migraine     "monthly" (01/30/2014)  . Arthritis     "knees; arms; elbows" (01/30/2014)  . Femoral condyle fracture ([place]) 03/08/2014    left medial/notes 03/09/2014  . Alcoholism /alcohol abuse [facility])    Past Surgical History  Procedure Laterality Date  . Facial fracture surgery Left 1990's    "result of trauma"   . Eye surgery Left 1990's    "result of trauma"   . Left heart catheterization with coronary angiogram Right 03/07/2013    Procedure: LEFT HEART CATHETERIZATION WITH CORONARY ANGIOGRAM;  Surgeon: [name], MD;  Location: [place] CATH LAB;  Service: Cardiovascular;  Laterality: Right;   Family History  Problem Relation Age of Onset  . Hypertension Other   . Coronary artery disease Other    Social History  Substance Use Topics  . Smoking status: Current  Every Day Smoker -- 1.00 packs/day for 30 years    Types: Cigarettes  . Smokeless tobacco: Current User    Types: Chew  . Alcohol Use: 10.8 oz/week    18 Cans of beer per week     Comment: Last drink Thursday 12/11/14 per patient    Review of Systems  Gastrointestinal: Positive for abdominal pain.  All other systems reviewed and are negative.     Allergies  Shellfish-derived products and Trazodone and nefazodone  Home Medications   Prior to Admission medications   Medication Sig Start Date End Date Taking? Authorizing Provider  acetaminophen (TYLENOL) 500 MG tablet Take 1,000 mg by mouth every 6 (six) hours as needed for moderate pain.    Historical Provider, MD  albuterol (PROVENTIL HFA;VENTOLIN HFA) 108 (90 BASE) MCG/ACT inhaler Inhale 2 puffs into the lungs every 6 (six) hours as needed for wheezing or shortness of breath (wheezing).     Historical Provider, MD  Alum & Mag Hydroxide-Simeth (GI COCKTAIL) SUSP suspension Take 30 mLs by mouth 2 (two) times daily as needed for indigestion. Shake well. 02/16/15   Sherwood Gambler, MD  folic acid (FOLVITE) 1 MG tablet Take 1 tablet (1 mg total) by mouth daily. 01/15/15   [name], MD  lipase/protease/amylase (CREON) 12000 UNITS CPEP capsule Take 2 capsules (24,000 Units total) by mouth 3 (three) times daily with meals. 01/15/15  [name], MD  loperamide (IMODIUM) 2 MG capsule Take 1-2 capsules (2-4 mg total) by mouth as needed for diarrhea or loose stools. 01/23/14   [name], PA-C  metoprolol tartrate (LOPRESSOR) 25 MG tablet Take 1 tablet (25 mg total) by mouth 2 (two) times daily. 01/15/15   [name], MD  Multiple Vitamin (MULTIVITAMIN) capsule Take 1 capsule by mouth daily. 02/06/14   [name], MD  omeprazole (PRILOSEC) 20 MG capsule Take 1 capsule (20 mg total) by mouth daily. 02/08/15   [name], PA-C  ondansetron (ZOFRAN ODT) 4 MG disintegrating tablet Take 1 tablet (4 mg total) by  mouth every 8 (eight) hours as needed for nausea. 02/28/15   Marella Chimes, PA-C  oxyCODONE-acetaminophen (PERCOCET/ROXICET) 5-325 MG tablet Take 2 tablets by mouth every 4 (four) hours as needed for severe pain. 02/28/15   Marella Chimes, PA-C  pantoprazole (PROTONIX) 20 MG tablet Take 1 tablet (20 mg total) by mouth daily. 02/28/15   Marella Chimes, PA-C  QUEtiapine (SEROQUEL) 50 MG tablet Take 100 mg by mouth at bedtime. 01/15/15   Historical Provider, MD  sertraline (ZOLOFT) 25 MG tablet Take 3 tablets (75 mg total) by mouth daily. 01/15/15   [name], MD  sildenafil (VIAGRA) 50 MG tablet Take 1 tablet (50 mg total) by mouth daily as needed for erectile dysfunction. 01/15/15   [name], MD  traMADol (ULTRAM) 50 MG tablet Take 1 tablet (50 mg total) by mouth every 8 (eight) hours as needed. 02/17/15   [name], MD   BP 149/96 mmHg  Pulse 85  Temp(Src) 98.3 F (36.8 C) (Temporal)  Resp 18  SpO2 97% Physical Exam  Constitutional: He is oriented to person, place, and time. He appears well-developed and well-nourished. No distress.  HENT:  Head: Normocephalic and atraumatic.  Right Ear: Hearing normal.  Left Ear: Hearing normal.  Nose: Nose normal.  Mouth/Throat: Oropharynx is clear and moist and mucous membranes are normal.  Eyes: Conjunctivae and EOM are normal. Pupils are equal, round, and reactive to light.  Neck: Normal range of motion. Neck supple.  Cardiovascular: Regular rhythm, S1 normal and S2 normal.  Exam reveals no gallop and no friction rub.   No murmur heard. Pulmonary/Chest: Effort normal and breath sounds normal. No respiratory distress. He exhibits no tenderness.  Abdominal: Soft. Normal appearance and bowel sounds are normal. There is no hepatosplenomegaly. There is tenderness in the epigastric area. There is no rebound, no guarding, no tenderness at McBurney's point and negative Murphy's sign. No hernia.  Musculoskeletal:  Normal range of motion.  Neurological: He is alert and oriented to person, place, and time. He has normal strength. No cranial nerve deficit or sensory deficit. Coordination normal. GCS eye subscore is 4. GCS verbal subscore is 5. GCS motor subscore is 6.  Skin: Skin is warm, dry and intact. No rash noted. No cyanosis.  Psychiatric: He has a normal mood and affect. His speech is normal and behavior is normal. Thought content normal.  Nursing note and vitals reviewed.   ED Course  Procedures (including critical care time) Labs Review Labs Reviewed  COMPREHENSIVE METABOLIC PANEL - Abnormal; Notable for the following:    Total Protein 8.7 (*)    AST 68 (*)    All other components within normal limits  CBC - Abnormal; Notable for the following:    WBC 11.5 (*)    RBC 4.13 (*)    HCT  38.7 (*)    All other components within normal limits  LIPASE, BLOOD  URINALYSIS, ROUTINE W REFLEX MICROSCOPIC (NOT AT [facility])    Imaging Review No results found. I have personally reviewed and evaluated these images and lab results as part of my medical decision-making.   EKG Interpretation None      MDM   Final diagnoses:  None   gastritis  Patient presents to emergency department with complaints of epigastric pain. Patient has been seen previously with similar complaints. Patient does have a history of pancreatitis, but also has a history of alcoholism. I suspect that the patient is expressing gastritis secondary to his alcohol intake. Lab work is unremarkable. Patient has improvement with GI cocktail. Will recommend he follows up with GI.    Orpah Greek, MD 03/02/15 2330

## 2015-03-02 NOTE — ED Notes (Signed)
Per EMS-states he is having abdominal pain-took home meds with no relief-was seen at [facility] for same symptoms-history of pancreatitis

## 2015-03-02 NOTE — Discharge Instructions (Signed)
Gastritis, Adult °Gastritis is soreness and puffiness (inflammation) of the lining of the stomach. If you do not get help, gastritis can cause bleeding and sores (ulcers) in the stomach. °HOME CARE  °· Only take medicine as told by your doctor. °· If you were given antibiotic medicines, take them as told. Finish the medicines even if you start to feel better. °· Drink enough fluids to keep your pee (urine) clear or pale yellow. °· Avoid foods and drinks that make your problems worse. Foods you may want to avoid include: °¨ Caffeine or alcohol. °¨ Chocolate. °¨ Mint. °¨ Garlic and onions. °¨ Spicy foods. °¨ Citrus fruits, including oranges, lemons, or limes. °¨ Food containing tomatoes, including sauce, chili, salsa, and pizza. °¨ Fried and fatty foods. °· Eat small meals throughout the day instead of large meals. °GET HELP RIGHT AWAY IF:  °· You have black or dark red poop (stools). °· You throw up (vomit) blood. It may look like coffee grounds. °· You cannot keep fluids down. °· Your belly (abdominal) pain gets worse. °· You have a fever. °· You do not feel better after 1 week. °· You have any other questions or concerns. °MAKE SURE YOU:  °· Understand these instructions. °· Will watch your condition. °· Will get help right away if you are not doing well or get worse. °  °This information is not intended to replace advice given to you by your health care provider. Make sure you discuss any questions you have with your health care provider. °  °Document Released: 10/12/2007 Document Revised: 07/18/2011 Document Reviewed: 06/08/2011 °Elsevier Interactive Patient Education ©2016 [employer] ° °

## 2015-03-05 ENCOUNTER — Emergency Department (HOSPITAL_COMMUNITY)
Admission: EM | Admit: 2015-03-05 | Discharge: 2015-03-05 | Disposition: A | Discharge status: Home / Self Care | Payer: Self-pay | Attending: Emergency Medicine | Admitting: Emergency Medicine

## 2015-03-05 ENCOUNTER — Encounter (HOSPITAL_COMMUNITY): Payer: Self-pay

## 2015-03-05 ENCOUNTER — Emergency Department (HOSPITAL_COMMUNITY): Payer: Self-pay

## 2015-03-05 DIAGNOSIS — F1410 Cocaine abuse, uncomplicated: Secondary | ICD-10-CM | POA: Insufficient documentation

## 2015-03-05 DIAGNOSIS — R197 Diarrhea, unspecified: Secondary | ICD-10-CM | POA: Insufficient documentation

## 2015-03-05 DIAGNOSIS — M19021 Primary osteoarthritis, right elbow: Secondary | ICD-10-CM | POA: Insufficient documentation

## 2015-03-05 DIAGNOSIS — Z8781 Personal history of (healed) traumatic fracture: Secondary | ICD-10-CM | POA: Insufficient documentation

## 2015-03-05 DIAGNOSIS — Z8639 Personal history of other endocrine, nutritional and metabolic disease: Secondary | ICD-10-CM | POA: Insufficient documentation

## 2015-03-05 DIAGNOSIS — R011 Cardiac murmur, unspecified: Secondary | ICD-10-CM | POA: Insufficient documentation

## 2015-03-05 DIAGNOSIS — K219 Gastro-esophageal reflux disease without esophagitis: Secondary | ICD-10-CM | POA: Insufficient documentation

## 2015-03-05 DIAGNOSIS — R1012 Left upper quadrant pain: Secondary | ICD-10-CM | POA: Insufficient documentation

## 2015-03-05 DIAGNOSIS — F1310 Sedative, hypnotic or anxiolytic abuse, uncomplicated: Secondary | ICD-10-CM | POA: Insufficient documentation

## 2015-03-05 DIAGNOSIS — I10 Essential (primary) hypertension: Secondary | ICD-10-CM | POA: Insufficient documentation

## 2015-03-05 DIAGNOSIS — Z9889 Other specified postprocedural states: Secondary | ICD-10-CM | POA: Insufficient documentation

## 2015-03-05 DIAGNOSIS — F419 Anxiety disorder, unspecified: Secondary | ICD-10-CM | POA: Insufficient documentation

## 2015-03-05 DIAGNOSIS — Z862 Personal history of diseases of the blood and blood-forming organs and certain disorders involving the immune mechanism: Secondary | ICD-10-CM | POA: Insufficient documentation

## 2015-03-05 DIAGNOSIS — R1011 Right upper quadrant pain: Secondary | ICD-10-CM | POA: Insufficient documentation

## 2015-03-05 DIAGNOSIS — R1013 Epigastric pain: Secondary | ICD-10-CM | POA: Insufficient documentation

## 2015-03-05 DIAGNOSIS — Z720 Tobacco use: Secondary | ICD-10-CM | POA: Insufficient documentation

## 2015-03-05 DIAGNOSIS — M19022 Primary osteoarthritis, left elbow: Secondary | ICD-10-CM | POA: Insufficient documentation

## 2015-03-05 DIAGNOSIS — R630 Anorexia: Secondary | ICD-10-CM | POA: Insufficient documentation

## 2015-03-05 DIAGNOSIS — R112 Nausea with vomiting, unspecified: Secondary | ICD-10-CM | POA: Insufficient documentation

## 2015-03-05 DIAGNOSIS — F329 Major depressive disorder, single episode, unspecified: Secondary | ICD-10-CM | POA: Insufficient documentation

## 2015-03-05 DIAGNOSIS — M170 Bilateral primary osteoarthritis of knee: Secondary | ICD-10-CM | POA: Insufficient documentation

## 2015-03-05 DIAGNOSIS — J45909 Unspecified asthma, uncomplicated: Secondary | ICD-10-CM | POA: Insufficient documentation

## 2015-03-05 LAB — CBC WITH DIFFERENTIAL/PLATELET
BASOS ABS: 0 10*3/uL (ref 0.0–0.1)
Basophils Relative: 0 %
EOS PCT: 3 %
Eosinophils Absolute: 0.4 10*3/uL (ref 0.0–0.7)
HCT: 38.4 % — ABNORMAL LOW (ref 39.0–52.0)
Hemoglobin: 12.8 g/dL — ABNORMAL LOW (ref 13.0–17.0)
LYMPHS PCT: 24 %
Lymphs Abs: 2.8 10*3/uL (ref 0.7–4.0)
MCH: 31.6 pg (ref 26.0–34.0)
MCHC: 33.3 g/dL (ref 30.0–36.0)
MCV: 94.8 fL (ref 78.0–100.0)
MONO ABS: 0.7 10*3/uL (ref 0.1–1.0)
MONOS PCT: 6 %
NEUTROS ABS: 7.9 10*3/uL — AB (ref 1.7–7.7)
Neutrophils Relative %: 67 %
PLATELETS: 237 10*3/uL (ref 150–400)
RBC: 4.05 MIL/uL — AB (ref 4.22–5.81)
RDW: 15.2 % (ref 11.5–15.5)
WBC: 11.7 10*3/uL — ABNORMAL HIGH (ref 4.0–10.5)

## 2015-03-05 LAB — COMPREHENSIVE METABOLIC PANEL
ALBUMIN: 3.8 g/dL (ref 3.5–5.0)
ALT: 51 U/L (ref 17–63)
AST: 71 U/L — AB (ref 15–41)
Alkaline Phosphatase: 110 U/L (ref 38–126)
Anion gap: 11 (ref 5–15)
BILIRUBIN TOTAL: 0.6 mg/dL (ref 0.3–1.2)
BUN: 7 mg/dL (ref 6–20)
CO2: 23 mmol/L (ref 22–32)
CREATININE: 0.76 mg/dL (ref 0.61–1.24)
Calcium: 9.2 mg/dL (ref 8.9–10.3)
Chloride: 103 mmol/L (ref 101–111)
GFR calc Af Amer: >60 mL/min (ref >60)
GLUCOSE: 99 mg/dL (ref 65–99)
POTASSIUM: 3.8 mmol/L (ref 3.5–5.1)
Sodium: 137 mmol/L (ref 135–145)
Total Protein: 7.1 g/dL (ref 6.5–8.1)

## 2015-03-05 LAB — URINALYSIS, ROUTINE W REFLEX MICROSCOPIC
BILIRUBIN URINE: NEGATIVE
Glucose, UA: NEGATIVE mg/dL
HGB URINE DIPSTICK: NEGATIVE
KETONES UR: NEGATIVE mg/dL
Leukocytes, UA: NEGATIVE
Nitrite: NEGATIVE
PROTEIN: NEGATIVE mg/dL
Specific Gravity, Urine: 1.014 (ref 1.005–1.030)
UROBILINOGEN UA: 0.2 mg/dL (ref 0.0–1.0)
pH: 5 (ref 5.0–8.0)

## 2015-03-05 LAB — RAPID URINE DRUG SCREEN, HOSP PERFORMED
Amphetamines: NOT DETECTED
Barbiturates: POSITIVE — AB
Benzodiazepines: NOT DETECTED
Cocaine: POSITIVE — AB
OPIATES: NOT DETECTED
Tetrahydrocannabinol: NOT DETECTED

## 2015-03-05 LAB — I-STAT TROPONIN, ED: Troponin i, poc: 0 ng/mL (ref 0.00–0.08)

## 2015-03-05 LAB — ETHANOL: Alcohol, Ethyl (B): <5 mg/dL (ref <5)

## 2015-03-05 LAB — LIPASE, BLOOD: Lipase: 62 U/L — ABNORMAL HIGH (ref 11–51)

## 2015-03-05 MED ORDER — SODIUM CHLORIDE 0.9 % IV BOLUS (SEPSIS)
1000.0000 mL | Freq: Once | INTRAVENOUS | Status: AC
  Administered 2015-03-05: 1000 mL via INTRAVENOUS

## 2015-03-05 MED ORDER — ONDANSETRON HCL 4 MG/2ML IJ SOLN
4.0000 mg | Freq: Once | INTRAMUSCULAR | Status: AC
  Administered 2015-03-05: 4 mg via INTRAVENOUS
  Filled 2015-03-05: qty 2

## 2015-03-05 MED ORDER — GI COCKTAIL ~~LOC~~
30.0000 mL | Freq: Once | ORAL | Status: AC
Start: 2015-03-05 — End: 2015-03-05
  Administered 2015-03-05: 30 mL via ORAL
  Filled 2015-03-05: qty 30

## 2015-03-05 NOTE — Discharge Instructions (Signed)

## 2015-03-05 NOTE — ED Provider Notes (Signed)
CSN: [id number]     Arrival date & time 03/05/15  6295 History   First MD Initiated Contact with Patient 03/05/15 0750     Chief Complaint  Patient presents with  . Abdominal Pain     (Consider location/radiation/quality/duration/timing/severity/associated sxs/prior Treatment) Patient is a 44 y.o. male presenting with abdominal pain.  Abdominal Pain Pain location:  Epigastric Pain quality: sharp   Pain radiates to:  Does not radiate Pain severity:  Moderate Onset quality:  Gradual Duration:  8 hours Timing:  Constant Progression:  Unchanged Chronicity:  New Relieved by:  Nothing Worsened by:  Palpation and eating Associated symptoms: anorexia, diarrhea (chronic), nausea and vomiting   Associated symptoms: no chest pain, no dysuria and no fever     Past Medical History  Diagnosis Date  . Hypertension   . Asthma   . Pancreatitis   . Cocaine abuse   . Depression   . H/O suicide attempt   . Heart murmur     "when he was little" (03/06/2013)  . Shortness of breath     "can happen at anytime" (03/06/2013)  . Anemia   . H/O hiatal hernia   . GERD (gastroesophageal reflux disease)   . Anxiety   . WPW (Wolff-Parkinson-White syndrome)     [name] 03/06/2013  . High cholesterol   . Migraine     "monthly" (01/30/2014)  . Arthritis     "knees; arms; elbows" (01/30/2014)  . Femoral condyle fracture ([place]) 03/08/2014    left medial/notes 03/09/2014  . Alcoholism /alcohol abuse [facility])    Past Surgical History  Procedure Laterality Date  . Facial fracture surgery Left 1990's    "result of trauma"   . Eye surgery Left 1990's    "result of trauma"   . Left heart catheterization with coronary angiogram Right 03/07/2013    Procedure: LEFT HEART CATHETERIZATION WITH CORONARY ANGIOGRAM;  Surgeon: [name], MD;  Location: [place] CATH LAB;  Service: Cardiovascular;  Laterality: Right;   Family History  Problem Relation Age of Onset  . Hypertension Other   . Coronary artery  disease Other    Social History  Substance Use Topics  . Smoking status: Current Every Day Smoker -- 1.00 packs/day for 30 years    Types: Cigarettes  . Smokeless tobacco: Current User    Types: Chew  . Alcohol Use: 10.8 oz/week    18 Cans of beer per week     Comment: Last drink Monday 10/24    Review of Systems  Constitutional: Negative for fever.  Cardiovascular: Negative for chest pain.  Gastrointestinal: Positive for nausea, vomiting, abdominal pain, diarrhea (chronic) and anorexia.  Genitourinary: Negative for dysuria.  All other systems reviewed and are negative.     Allergies  Shellfish-derived products and Trazodone and nefazodone  Home Medications   Prior to Admission medications   Medication Sig Start Date End Date Taking? Authorizing Provider  lipase/protease/amylase (CREON) 12000 UNITS CPEP capsule Take 2 capsules (24,000 Units total) by mouth 3 (three) times daily with meals. 01/15/15  Yes [name], MD  loperamide (IMODIUM) 2 MG capsule Take 1-2 capsules (2-4 mg total) by mouth as needed for diarrhea or loose stools. 01/23/14  Yes [name], PA-C  metoprolol tartrate (LOPRESSOR) 25 MG tablet Take 1 tablet (25 mg total) by mouth 2 (two) times daily. 01/15/15  Yes [name], MD  Multiple Vitamin (MULTIVITAMIN) capsule Take 1 capsule by mouth daily. 02/06/14  Yes [name], MD  omeprazole (PRILOSEC) 20 MG capsule Take 1 capsule (20 mg total) by mouth daily. 02/08/15  Yes [name], PA-C  ondansetron (ZOFRAN ODT) 4 MG disintegrating tablet Take 1 tablet (4 mg total) by mouth every 8 (eight) hours as needed for nausea. 02/28/15  Yes Marella Chimes, PA-C  oxyCODONE-acetaminophen (PERCOCET/ROXICET) 5-325 MG tablet Take 2 tablets by mouth every 4 (four) hours as needed for severe pain. 02/28/15  Yes Marella Chimes, PA-C  sertraline (ZOLOFT) 25 MG tablet Take 3 tablets (75 mg total) by mouth daily. 01/15/15  Yes [name], MD  sildenafil (VIAGRA) 50 MG tablet Take 1 tablet (50 mg total) by mouth daily as needed for erectile dysfunction. 01/15/15  Yes Olugbemiga Essie Christine, MD  sucralfate (CARAFATE) 1 G tablet Take 1 tablet (1 g total) by mouth 4 (four) times daily -  with meals and at bedtime. 03/02/15  Yes Orpah Greek, MD  traMADol (ULTRAM) 50 MG tablet Take 1 tablet (50 mg total) by mouth every 8 (eight) hours as needed. 02/17/15  Yes [name], MD  albuterol (PROVENTIL HFA;VENTOLIN HFA) 108 (90 BASE) MCG/ACT inhaler Inhale 2 puffs into the lungs every 6 (six) hours as needed for wheezing or shortness of breath (wheezing).     Historical Provider, MD  Alum & Mag Hydroxide-Simeth (GI COCKTAIL) SUSP suspension Take 30 mLs by mouth 2 (two) times daily as needed for indigestion. Shake well. Patient not taking: Reported on 03/02/2015 02/16/15   Sherwood Gambler, MD  famotidine (PEPCID) 20 MG tablet Take 1 tablet (20 mg total) by mouth 2 (two) times daily. Patient not taking: Reported on 03/05/2015 03/02/15   Orpah Greek, MD  folic acid (FOLVITE) 1 MG tablet Take 1 tablet (1 mg total) by mouth daily. Patient not taking: Reported on 03/05/2015 01/15/15   [name], MD  pantoprazole (PROTONIX) 20 MG tablet Take 1 tablet (20 mg total) by mouth daily. Patient not taking: Reported on 03/02/2015 02/28/15   Marella Chimes, PA-C  QUEtiapine (SEROQUEL) 50 MG tablet Take 100 mg by mouth at bedtime. 01/15/15   Historical Provider, MD   BP 146/102 mmHg  Pulse 87  Temp(Src) 98.5 F (36.9 C) (Oral)  Resp 13  Ht 5\' 9"  (1.753 m)  Wt 138 lb (62.596 kg)  BMI 20.37 kg/m2  SpO2 100% Physical Exam  Constitutional: He is oriented to person, place, and time. He appears well-developed and well-nourished.  HENT:  Head: Normocephalic and atraumatic.  Eyes: Conjunctivae and EOM are normal.  Neck: Normal range of motion. Neck supple.  Cardiovascular: Normal rate, regular rhythm and normal  heart sounds.   Pulmonary/Chest: Effort normal and breath sounds normal. No respiratory distress.  Abdominal: He exhibits no distension. There is tenderness in the right upper quadrant, epigastric area and left upper quadrant. There is no rebound and no guarding.  Musculoskeletal: Normal range of motion.  Neurological: He is alert and oriented to person, place, and time.  Skin: Skin is warm and dry.  Vitals reviewed.   ED Course  Procedures (including critical care time) Labs Review Labs Reviewed  COMPREHENSIVE METABOLIC PANEL - Abnormal; Notable for the following:    AST 71 (*)    All other components within normal limits  LIPASE, BLOOD - Abnormal; Notable for the following:    Lipase 62 (*)    All other components within normal limits  CBC WITH DIFFERENTIAL/PLATELET - Abnormal; Notable for the following:    WBC 11.7 (*)  RBC 4.05 (*)    Hemoglobin 12.8 (*)    HCT 38.4 (*)    Neutro Abs 7.9 (*)    All other components within normal limits  URINE RAPID DRUG SCREEN, HOSP PERFORMED - Abnormal; Notable for the following:    Cocaine POSITIVE (*)    Barbiturates POSITIVE (*)    All other components within normal limits  ETHANOL  URINALYSIS, ROUTINE W REFLEX MICROSCOPIC (NOT AT [facility])  Randolm Idol, ED    Imaging Review Dg Chest 2 View  03/05/2015  CLINICAL DATA:  Chest pain, abdominal pain for 3 days, cough EXAM: CHEST  2 VIEW COMPARISON:  11/26/2014 FINDINGS: Cardiomediastinal silhouette is stable. No acute infiltrate or pleural effusion. No pulmonary edema. Bony thorax is unremarkable. IMPRESSION: No active cardiopulmonary disease. Electronically Signed   By: Lahoma Crocker M.D.   On: 03/05/2015 08:42   I have personally reviewed and evaluated these images and lab results as part of my medical decision-making.   EKG Interpretation   Date/Time:  Thursday March 05 2015 07:49:25 EDT Ventricular Rate:  84 PR Interval:  104 QRS Duration: 75 QT Interval:  329 QTC  Calculation: 389 R Axis:   73 Text Interpretation:  Sinus rhythm Short PR interval Biatrial enlargement  Borderline repolarization abnormality No significant change since last  tracing Confirmed by Debby Freiberg [phone]) on 03/05/2015 7:55:51 AM      MDM   Final diagnoses:  Epigastric pain    44 y.o. male with pertinent PMH of etoh abuse, prior cocaine use (denies currently), pancreatitis presents with recurrent abd pain.  Seen 5 days ago for same.  Physical exam as above on arrival.    Elba Barman as above unremarkable for pt.  Given GI cocktail.  No reports of PO intolerance.  UDS + for cocaine.  No indication for narcotics.  DC home in stable condition  I have reviewed all laboratory and imaging studies if ordered as above  1. Epigastric pain         Debby Freiberg, MD 03/05/15 [phone]

## 2015-03-05 NOTE — ED Notes (Signed)
[facility]- pt coming from home with c/o LUQ abd pain. Hx of same and pancreatitis. Pt denies alcohol use. Pt also reports he has been taking prescribed medications as appropriate at home.

## 2015-03-15 ENCOUNTER — Encounter (HOSPITAL_COMMUNITY): Payer: Self-pay | Admitting: Emergency Medicine

## 2015-03-15 ENCOUNTER — Emergency Department (HOSPITAL_COMMUNITY)
Admission: EM | Admit: 2015-03-15 | Discharge: 2015-03-15 | Disposition: A | Discharge status: Home / Self Care | Payer: Self-pay | Attending: Emergency Medicine | Admitting: Emergency Medicine

## 2015-03-15 DIAGNOSIS — R1013 Epigastric pain: Secondary | ICD-10-CM | POA: Insufficient documentation

## 2015-03-15 DIAGNOSIS — F1410 Cocaine abuse, uncomplicated: Secondary | ICD-10-CM | POA: Insufficient documentation

## 2015-03-15 DIAGNOSIS — K219 Gastro-esophageal reflux disease without esophagitis: Secondary | ICD-10-CM | POA: Insufficient documentation

## 2015-03-15 DIAGNOSIS — Z79899 Other long term (current) drug therapy: Secondary | ICD-10-CM | POA: Insufficient documentation

## 2015-03-15 DIAGNOSIS — F329 Major depressive disorder, single episode, unspecified: Secondary | ICD-10-CM | POA: Insufficient documentation

## 2015-03-15 DIAGNOSIS — F419 Anxiety disorder, unspecified: Secondary | ICD-10-CM | POA: Insufficient documentation

## 2015-03-15 DIAGNOSIS — R011 Cardiac murmur, unspecified: Secondary | ICD-10-CM | POA: Insufficient documentation

## 2015-03-15 DIAGNOSIS — Z9889 Other specified postprocedural states: Secondary | ICD-10-CM | POA: Insufficient documentation

## 2015-03-15 DIAGNOSIS — Z8639 Personal history of other endocrine, nutritional and metabolic disease: Secondary | ICD-10-CM | POA: Insufficient documentation

## 2015-03-15 DIAGNOSIS — J45909 Unspecified asthma, uncomplicated: Secondary | ICD-10-CM | POA: Insufficient documentation

## 2015-03-15 DIAGNOSIS — R1012 Left upper quadrant pain: Secondary | ICD-10-CM | POA: Insufficient documentation

## 2015-03-15 DIAGNOSIS — Z8781 Personal history of (healed) traumatic fracture: Secondary | ICD-10-CM | POA: Insufficient documentation

## 2015-03-15 DIAGNOSIS — D649 Anemia, unspecified: Secondary | ICD-10-CM | POA: Insufficient documentation

## 2015-03-15 DIAGNOSIS — I10 Essential (primary) hypertension: Secondary | ICD-10-CM | POA: Insufficient documentation

## 2015-03-15 DIAGNOSIS — M1990 Unspecified osteoarthritis, unspecified site: Secondary | ICD-10-CM | POA: Insufficient documentation

## 2015-03-15 DIAGNOSIS — G8929 Other chronic pain: Secondary | ICD-10-CM | POA: Insufficient documentation

## 2015-03-15 LAB — CBC
HCT: 35.7 % — ABNORMAL LOW (ref 39.0–52.0)
Hemoglobin: 11.9 g/dL — ABNORMAL LOW (ref 13.0–17.0)
MCH: 32 pg (ref 26.0–34.0)
MCHC: 33.3 g/dL (ref 30.0–36.0)
MCV: 96 fL (ref 78.0–100.0)
PLATELETS: 258 10*3/uL (ref 150–400)
RBC: 3.72 MIL/uL — AB (ref 4.22–5.81)
RDW: 15.6 % — AB (ref 11.5–15.5)
WBC: 7 10*3/uL (ref 4.0–10.5)

## 2015-03-15 LAB — COMPREHENSIVE METABOLIC PANEL
ALT: 59 U/L (ref 17–63)
AST: 131 U/L — ABNORMAL HIGH (ref 15–41)
Albumin: 3.8 g/dL (ref 3.5–5.0)
Alkaline Phosphatase: 101 U/L (ref 38–126)
Anion gap: 10 (ref 5–15)
BUN: 10 mg/dL (ref 6–20)
CO2: 21 mmol/L — ABNORMAL LOW (ref 22–32)
Calcium: 8.6 mg/dL — ABNORMAL LOW (ref 8.9–10.3)
Chloride: 108 mmol/L (ref 101–111)
Creatinine, Ser: 0.68 mg/dL (ref 0.61–1.24)
GFR calc Af Amer: >60 mL/min (ref >60)
GFR calc non Af Amer: >60 mL/min (ref >60)
Glucose, Bld: 96 mg/dL (ref 65–99)
Potassium: 4.2 mmol/L (ref 3.5–5.1)
Sodium: 139 mmol/L (ref 135–145)
Total Bilirubin: 0.7 mg/dL (ref 0.3–1.2)
Total Protein: 7.5 g/dL (ref 6.5–8.1)

## 2015-03-15 LAB — RAPID URINE DRUG SCREEN, HOSP PERFORMED
Amphetamines: NOT DETECTED
Barbiturates: NOT DETECTED
Benzodiazepines: NOT DETECTED
Cocaine: POSITIVE — AB
Opiates: NOT DETECTED
Tetrahydrocannabinol: NOT DETECTED

## 2015-03-15 LAB — URINALYSIS, ROUTINE W REFLEX MICROSCOPIC
Bilirubin Urine: NEGATIVE
Glucose, UA: NEGATIVE mg/dL
Hgb urine dipstick: NEGATIVE
Ketones, ur: NEGATIVE mg/dL
Leukocytes, UA: NEGATIVE
Nitrite: NEGATIVE
Protein, ur: NEGATIVE mg/dL
Specific Gravity, Urine: 1.025 (ref 1.005–1.030)
Urobilinogen, UA: 0.2 mg/dL (ref 0.0–1.0)
pH: 5.5 (ref 5.0–8.0)

## 2015-03-15 LAB — LIPASE, BLOOD: Lipase: 69 U/L — ABNORMAL HIGH (ref 11–51)

## 2015-03-15 LAB — ETHANOL: Alcohol, Ethyl (B): <5 mg/dL (ref <5)

## 2015-03-15 MED ORDER — KETOROLAC TROMETHAMINE 30 MG/ML IJ SOLN
30.0000 mg | Freq: Once | INTRAMUSCULAR | Status: AC
  Administered 2015-03-15: 30 mg via INTRAVENOUS
  Filled 2015-03-15: qty 1

## 2015-03-15 MED ORDER — METOCLOPRAMIDE HCL 10 MG PO TABS: 10.0000 mg | ORAL_TABLET | Freq: Four times a day (QID) | ORAL | Status: DC

## 2015-03-15 MED ORDER — SODIUM CHLORIDE 0.9 % IV BOLUS (SEPSIS)
1000.0000 mL | Freq: Once | INTRAVENOUS | Status: AC
  Administered 2015-03-15: 1000 mL via INTRAVENOUS

## 2015-03-15 MED ORDER — METOCLOPRAMIDE HCL 5 MG/ML IJ SOLN
10.0000 mg | Freq: Once | INTRAMUSCULAR | Status: AC
  Administered 2015-03-15: 10 mg via INTRAVENOUS
  Filled 2015-03-15: qty 2

## 2015-03-15 MED ORDER — SUCRALFATE 1 G PO TABS: 1.0000 g | ORAL_TABLET | Freq: Three times a day (TID) | ORAL | Status: DC

## 2015-03-15 MED ORDER — GI COCKTAIL ~~LOC~~
30.0000 mL | Freq: Once | ORAL | Status: AC
  Administered 2015-03-15: 30 mL via ORAL
  Filled 2015-03-15: qty 30

## 2015-03-15 NOTE — Discharge Instructions (Signed)

## 2015-03-15 NOTE — ED Provider Notes (Signed)
CSN: [id number]     Arrival date & time 03/15/15  0430 History   First MD Initiated Contact with Patient 03/15/15 [phone]     Chief Complaint  Patient presents with  . Abdominal Pain     (Consider location/radiation/quality/duration/timing/severity/associated sxs/prior Treatment) HPI Comments: Patient with a history of chronic pancreatitis, alcohol dependence, depression with h/o suicide attempt, cocaine abuse, HTN and asthma presents to the emergency room with recurrent LUQ abdominal pain that is sharp, non-radiating and c/w previous episodes of pancreatitis. He denies recent alcohol use. No fever. He has had nausea with vomiting non-bloody emesis. No diarrhea.   Patient is a 44 y.o. male presenting with abdominal pain. The history is provided by the patient. No language interpreter was used.  Abdominal Pain Pain location:  LUQ and epigastric Pain quality: sharp   Pain radiates to:  Does not radiate Pain severity:  Severe Onset quality:  Gradual Duration:  2 days Timing:  Constant Associated symptoms: nausea and vomiting   Associated symptoms: no chest pain, no chills, no diarrhea, no fever and no shortness of breath     Past Medical History  Diagnosis Date  . Hypertension   . Asthma   . Pancreatitis   . Cocaine abuse   . Depression   . H/O suicide attempt   . Heart murmur     "when he was little" (03/06/2013)  . Shortness of breath     "can happen at anytime" (03/06/2013)  . Anemia   . H/O hiatal hernia   . GERD (gastroesophageal reflux disease)   . Anxiety   . WPW (Wolff-Parkinson-White syndrome)     [name] 03/06/2013  . High cholesterol   . Migraine     "monthly" (01/30/2014)  . Arthritis     "knees; arms; elbows" (01/30/2014)  . Femoral condyle fracture ([place]) 03/08/2014    left medial/notes 03/09/2014  . Alcoholism /alcohol abuse [facility])    Past Surgical History  Procedure Laterality Date  . Facial fracture surgery Left 1990's    "result of trauma"   . Eye surgery  Left 1990's    "result of trauma"   . Left heart catheterization with coronary angiogram Right 03/07/2013    Procedure: LEFT HEART CATHETERIZATION WITH CORONARY ANGIOGRAM;  Surgeon: [name], MD;  Location: [place] CATH LAB;  Service: Cardiovascular;  Laterality: Right;   Family History  Problem Relation Age of Onset  . Hypertension Other   . Coronary artery disease Other    Social History  Substance Use Topics  . Smoking status: Current Every Day Smoker -- 1.00 packs/day for 30 years    Types: Cigarettes  . Smokeless tobacco: Current User    Types: Chew  . Alcohol Use: 10.8 oz/week    18 Cans of beer per week     Comment: Last drink Monday 10/24    Review of Systems  Constitutional: Negative for fever and chills.  Respiratory: Negative.  Negative for shortness of breath.   Cardiovascular: Negative.  Negative for chest pain.  Gastrointestinal: Positive for nausea, vomiting and abdominal pain. Negative for diarrhea.  Musculoskeletal: Negative.  Negative for back pain.  Skin: Negative.   Neurological: Negative.       Allergies  Shellfish-derived products and Trazodone and nefazodone  Home Medications   Prior to Admission medications   Medication Sig Start Date End Date Taking? Authorizing Provider  albuterol (PROVENTIL HFA;VENTOLIN HFA) 108 (90 BASE) MCG/ACT inhaler Inhale 2 puffs into the lungs every 6 (six) hours as needed  for wheezing or shortness of breath (wheezing).    Yes Historical Provider, MD  Alum & Mag Hydroxide-Simeth (GI COCKTAIL) SUSP suspension Take 30 mLs by mouth 2 (two) times daily as needed for indigestion. Shake well. 02/16/15  Yes Sherwood Gambler, MD  lipase/protease/amylase (CREON) 12000 UNITS CPEP capsule Take 2 capsules (24,000 Units total) by mouth 3 (three) times daily with meals. 01/15/15  Yes [name], MD  loperamide (IMODIUM) 2 MG capsule Take 1-2 capsules (2-4 mg total) by mouth as needed for diarrhea or loose stools. 01/23/14  Yes  [name], PA-C  metoprolol tartrate (LOPRESSOR) 25 MG tablet Take 1 tablet (25 mg total) by mouth 2 (two) times daily. 01/15/15  Yes [name], MD  Multiple Vitamin (MULTIVITAMIN) capsule Take 1 capsule by mouth daily. 02/06/14  Yes [name], MD  omeprazole (PRILOSEC) 20 MG capsule Take 1 capsule (20 mg total) by mouth daily. 02/08/15  Yes [name], PA-C  ondansetron (ZOFRAN ODT) 4 MG disintegrating tablet Take 1 tablet (4 mg total) by mouth every 8 (eight) hours as needed for nausea. 02/28/15  Yes Marella Chimes, PA-C  oxyCODONE-acetaminophen (PERCOCET/ROXICET) 5-325 MG tablet Take 2 tablets by mouth every 4 (four) hours as needed for severe pain. 02/28/15  Yes Marella Chimes, PA-C  QUEtiapine (SEROQUEL) 50 MG tablet Take 100 mg by mouth at bedtime. 01/15/15  Yes Historical Provider, MD  sertraline (ZOLOFT) 25 MG tablet Take 3 tablets (75 mg total) by mouth daily. 01/15/15  Yes [name], MD  sildenafil (VIAGRA) 50 MG tablet Take 1 tablet (50 mg total) by mouth daily as needed for erectile dysfunction. 01/15/15  Yes [name], MD  famotidine (PEPCID) 20 MG tablet Take 1 tablet (20 mg total) by mouth 2 (two) times daily. Patient not taking: Reported on 03/05/2015 03/02/15   Orpah Greek, MD  folic acid (FOLVITE) 1 MG tablet Take 1 tablet (1 mg total) by mouth daily. Patient not taking: Reported on 03/05/2015 01/15/15   [name], MD  pantoprazole (PROTONIX) 20 MG tablet Take 1 tablet (20 mg total) by mouth daily. Patient not taking: Reported on 03/02/2015 02/28/15   Marella Chimes, PA-C  sucralfate (CARAFATE) 1 G tablet Take 1 tablet (1 g total) by mouth 4 (four) times daily -  with meals and at bedtime. 03/02/15   Orpah Greek, MD  traMADol (ULTRAM) 50 MG tablet Take 1 tablet (50 mg total) by mouth every 8 (eight) hours as needed. Patient not taking: Reported on 03/15/2015 02/17/15   [name], MD    BP 162/95 mmHg  Pulse 99  Temp(Src) 97.9 F (36.6 C) (Oral)  Resp 16  SpO2 100% Physical Exam  Constitutional: He is oriented to person, place, and time. He appears well-developed and well-nourished.  Uncomfortable appearing.  HENT:  Head: Normocephalic.  Neck: Normal range of motion. Neck supple.  Cardiovascular: Normal rate and regular rhythm.   Pulmonary/Chest: Effort normal and breath sounds normal.  Abdominal: Soft. Bowel sounds are normal. There is tenderness. There is no rebound and no guarding.  Tender epigastric and LUQ abdomen.  Musculoskeletal: Normal range of motion.  Neurological: He is alert and oriented to person, place, and time.  Skin: Skin is warm and dry. No rash noted.  Psychiatric: He has a normal mood and affect.    ED Course  Procedures (including critical care time) Labs Review Labs Reviewed  LIPASE, BLOOD  COMPREHENSIVE METABOLIC PANEL  CBC  URINALYSIS, ROUTINE W REFLEX MICROSCOPIC (NOT AT [facility])   Results for orders placed or performed during the hospital encounter of 03/15/15  Lipase, blood  Result Value Ref Range   Lipase 69 (H) 11 - 51 U/L  Comprehensive metabolic panel  Result Value Ref Range   Sodium 139 135 - 145 mmol/L   Potassium 4.2 3.5 - 5.1 mmol/L   Chloride 108 101 - 111 mmol/L   CO2 21 (L) 22 - 32 mmol/L   Glucose, Bld 96 65 - 99 mg/dL   BUN 10 6 - 20 mg/dL   Creatinine, Ser 0.68 0.61 - 1.24 mg/dL   Calcium 8.6 (L) 8.9 - 10.3 mg/dL   Total Protein 7.5 6.5 - 8.1 g/dL   Albumin 3.8 3.5 - 5.0 g/dL   AST 131 (H) 15 - 41 U/L   ALT 59 17 - 63 U/L   Alkaline Phosphatase 101 38 - 126 U/L   Total Bilirubin 0.7 0.3 - 1.2 mg/dL   GFR calc non Af Amer >60 >60 mL/min   GFR calc Af Amer >60 >60 mL/min   Anion gap 10 5 - 15  CBC  Result Value Ref Range   WBC 7.0 4.0 - 10.5 K/uL   RBC 3.72 (L) 4.22 - 5.81 MIL/uL   Hemoglobin 11.9 (L) 13.0 - 17.0 g/dL   HCT 35.7 (L) 39.0 - 52.0 %   MCV 96.0 78.0 - 100.0 fL   MCH 32.0 26.0 - 34.0 pg    MCHC 33.3 30.0 - 36.0 g/dL   RDW 15.6 (H) 11.5 - 15.5 %   Platelets 258 150 - 400 K/uL  Urinalysis, Routine w reflex microscopic (not at [facility])  Result Value Ref Range   Color, Urine YELLOW YELLOW   APPearance CLEAR CLEAR   Specific Gravity, Urine 1.025 1.005 - 1.030   pH 5.5 5.0 - 8.0   Glucose, UA NEGATIVE NEGATIVE mg/dL   Hgb urine dipstick NEGATIVE NEGATIVE   Bilirubin Urine NEGATIVE NEGATIVE   Ketones, ur NEGATIVE NEGATIVE mg/dL   Protein, ur NEGATIVE NEGATIVE mg/dL   Urobilinogen, UA 0.2 0.0 - 1.0 mg/dL   Nitrite NEGATIVE NEGATIVE   Leukocytes, UA NEGATIVE NEGATIVE  Ethanol  Result Value Ref Range   Alcohol, Ethyl (B) <5 <5 mg/dL  Urine rapid drug screen (hosp performed)  Result Value Ref Range   Opiates NONE DETECTED NONE DETECTED   Cocaine POSITIVE (A) NONE DETECTED   Benzodiazepines NONE DETECTED NONE DETECTED   Amphetamines NONE DETECTED NONE DETECTED   Tetrahydrocannabinol NONE DETECTED NONE DETECTED   Barbiturates NONE DETECTED NONE DETECTED   . Imaging Review No results found. I have personally reviewed and evaluated these images and lab results as part of my medical decision-making.   EKG Interpretation None      MDM   Final diagnoses:  None    1. Chronic abdominal pain 2. Polysubstance abuse  The patient appears stable without fever or evidence acute phase pancreatitis. Labs are reassuring, alcohol negative, cocaine again positive. Feel he can be discharged home to follow up with PCP for recheck and further outpatient management.     Charlann Lange, PA-C 03/15/15 0370  April Palumbo, MD 03/15/15 [phone]

## 2015-03-15 NOTE — ED Notes (Signed)
Bed: JS43 Expected date:  Expected time:  Means of arrival:  Comments: abd pain/pancreatitis

## 2015-03-15 NOTE — ED Notes (Addendum)
Pt from home c/o left sided abdominal pain that  Started about 1500 11/5. Hx of chronic pancreatitis.  Pt reports vomiting and diarrhea. He reports 6 episodes of diarrhea and 3 episodes of vomiting. He denies alcohol use.

## 2015-03-15 NOTE — ED Notes (Addendum)
Pt aware of the need for a urine sample. Urinal at bedside. 

## 2015-03-15 NOTE — ED Notes (Signed)
PA at bedside.

## 2015-03-15 NOTE — ED Notes (Signed)
Pt ambulated to restroom with steady gait.

## 2015-03-15 NOTE — ED Notes (Signed)
Pt alert, oriented, and ambulatory upon DC. He was advised to follow up with PCP regarding pain medication refills. He was encouraged to take reglan and carafate for further pain and nausea relief. He was provided a bus pass per his request.

## 2015-03-22 ENCOUNTER — Encounter (HOSPITAL_COMMUNITY): Payer: Self-pay | Admitting: *Deleted

## 2015-03-22 ENCOUNTER — Emergency Department (HOSPITAL_COMMUNITY)
Admission: EM | Admit: 2015-03-22 | Discharge: 2015-03-22 | Disposition: A | Discharge status: Home / Self Care | Payer: Self-pay | Attending: Physician Assistant | Admitting: Physician Assistant

## 2015-03-22 DIAGNOSIS — Z79899 Other long term (current) drug therapy: Secondary | ICD-10-CM | POA: Insufficient documentation

## 2015-03-22 DIAGNOSIS — Z8781 Personal history of (healed) traumatic fracture: Secondary | ICD-10-CM | POA: Insufficient documentation

## 2015-03-22 DIAGNOSIS — M158 Other polyosteoarthritis: Secondary | ICD-10-CM | POA: Insufficient documentation

## 2015-03-22 DIAGNOSIS — F419 Anxiety disorder, unspecified: Secondary | ICD-10-CM | POA: Insufficient documentation

## 2015-03-22 DIAGNOSIS — F329 Major depressive disorder, single episode, unspecified: Secondary | ICD-10-CM | POA: Insufficient documentation

## 2015-03-22 DIAGNOSIS — R109 Unspecified abdominal pain: Secondary | ICD-10-CM | POA: Insufficient documentation

## 2015-03-22 DIAGNOSIS — G8929 Other chronic pain: Secondary | ICD-10-CM | POA: Insufficient documentation

## 2015-03-22 DIAGNOSIS — F17210 Nicotine dependence, cigarettes, uncomplicated: Secondary | ICD-10-CM | POA: Insufficient documentation

## 2015-03-22 DIAGNOSIS — R112 Nausea with vomiting, unspecified: Secondary | ICD-10-CM | POA: Insufficient documentation

## 2015-03-22 DIAGNOSIS — D649 Anemia, unspecified: Secondary | ICD-10-CM | POA: Insufficient documentation

## 2015-03-22 DIAGNOSIS — K219 Gastro-esophageal reflux disease without esophagitis: Secondary | ICD-10-CM | POA: Insufficient documentation

## 2015-03-22 DIAGNOSIS — R011 Cardiac murmur, unspecified: Secondary | ICD-10-CM | POA: Insufficient documentation

## 2015-03-22 DIAGNOSIS — Z8639 Personal history of other endocrine, nutritional and metabolic disease: Secondary | ICD-10-CM | POA: Insufficient documentation

## 2015-03-22 DIAGNOSIS — J45909 Unspecified asthma, uncomplicated: Secondary | ICD-10-CM | POA: Insufficient documentation

## 2015-03-22 DIAGNOSIS — I10 Essential (primary) hypertension: Secondary | ICD-10-CM | POA: Insufficient documentation

## 2015-03-22 LAB — LIPASE, BLOOD: LIPASE: 35 U/L (ref 11–51)

## 2015-03-22 LAB — COMPREHENSIVE METABOLIC PANEL
ALBUMIN: 4.1 g/dL (ref 3.5–5.0)
ALT: 90 U/L — ABNORMAL HIGH (ref 17–63)
ANION GAP: 10 (ref 5–15)
AST: 159 U/L — AB (ref 15–41)
Alkaline Phosphatase: 116 U/L (ref 38–126)
BILIRUBIN TOTAL: 2 mg/dL — AB (ref 0.3–1.2)
BUN: 6 mg/dL (ref 6–20)
CALCIUM: 9.1 mg/dL (ref 8.9–10.3)
CHLORIDE: 102 mmol/L (ref 101–111)
CO2: 24 mmol/L (ref 22–32)
Creatinine, Ser: 0.68 mg/dL (ref 0.61–1.24)
GFR calc Af Amer: >60 mL/min (ref >60)
GFR calc non Af Amer: >60 mL/min (ref >60)
GLUCOSE: 79 mg/dL (ref 65–99)
POTASSIUM: 5.6 mmol/L — AB (ref 3.5–5.1)
SODIUM: 136 mmol/L (ref 135–145)
TOTAL PROTEIN: 8.4 g/dL — AB (ref 6.5–8.1)

## 2015-03-22 LAB — CBC WITH DIFFERENTIAL/PLATELET
BASOS ABS: 0 10*3/uL (ref 0.0–0.1)
BASOS PCT: 0 %
EOS ABS: 0.2 10*3/uL (ref 0.0–0.7)
Eosinophils Relative: 4 %
HEMATOCRIT: 41.6 % (ref 39.0–52.0)
HEMOGLOBIN: 14 g/dL (ref 13.0–17.0)
Lymphocytes Relative: 36 %
Lymphs Abs: 2.3 10*3/uL (ref 0.7–4.0)
MCH: 32 pg (ref 26.0–34.0)
MCHC: 33.7 g/dL (ref 30.0–36.0)
MCV: 95 fL (ref 78.0–100.0)
MONOS PCT: 10 %
Monocytes Absolute: 0.6 10*3/uL (ref 0.1–1.0)
NEUTROS ABS: 3.2 10*3/uL (ref 1.7–7.7)
NEUTROS PCT: 50 %
Platelets: 253 10*3/uL (ref 150–400)
RBC: 4.38 MIL/uL (ref 4.22–5.81)
RDW: 15.3 % (ref 11.5–15.5)
WBC: 6.4 10*3/uL (ref 4.0–10.5)

## 2015-03-22 MED ORDER — GI COCKTAIL ~~LOC~~
30.0000 mL | Freq: Once | ORAL | Status: AC
  Administered 2015-03-22: 30 mL via ORAL
  Filled 2015-03-22: qty 30

## 2015-03-22 MED ORDER — OXYCODONE-ACETAMINOPHEN 5-325 MG PO TABS
1.0000 | ORAL_TABLET | Freq: Once | ORAL | Status: AC
  Administered 2015-03-22: 1 via ORAL
  Filled 2015-03-22: qty 1

## 2015-03-22 MED ORDER — SODIUM CHLORIDE 0.9 % IV BOLUS (SEPSIS)
1000.0000 mL | Freq: Once | INTRAVENOUS | Status: AC
  Administered 2015-03-22: 1000 mL via INTRAVENOUS

## 2015-03-22 MED ORDER — PROMETHAZINE HCL 25 MG/ML IJ SOLN
12.5000 mg | Freq: Once | INTRAMUSCULAR | Status: AC
  Administered 2015-03-22: 12.5 mg via INTRAVENOUS
  Filled 2015-03-22: qty 1

## 2015-03-22 MED ORDER — DIPHENHYDRAMINE HCL 50 MG/ML IJ SOLN
25.0000 mg | Freq: Once | INTRAMUSCULAR | Status: AC
  Administered 2015-03-22: 25 mg via INTRAVENOUS
  Filled 2015-03-22: qty 1

## 2015-03-22 MED ORDER — ONDANSETRON HCL 4 MG PO TABS: 4.0000 mg | ORAL_TABLET | Freq: Three times a day (TID) | ORAL | Status: DC | PRN

## 2015-03-22 NOTE — ED Notes (Signed)
MD at bedside. 

## 2015-03-22 NOTE — ED Provider Notes (Signed)
CSN: [id number]     Arrival date & time 03/22/15  [id number] History   First MD Initiated Contact with Patient 03/22/15 [phone]     Chief Complaint  Patient presents with  . Abdominal Pain     (Consider location/radiation/quality/duration/timing/severity/associated sxs/prior Treatment) HPI   Patient is a 44 year old male presenting today with acute on chronic pancreatitis pain. Patient is seen frequently in our emergency department for similar complaints. Patient had 30 of Percocet filled on 03/06/2015. Patient reports that is not working for the pain. Patient reports nausea and vomiting starting yesterday. He says it feels similar to his usual pancreatitis flares.  No urinary symptoms no cough no diarrhea.    Past Medical History  Diagnosis Date  . Hypertension   . Asthma   . Pancreatitis   . Cocaine abuse   . Depression   . H/O suicide attempt   . Heart murmur     "when he was little" (03/06/2013)  . Shortness of breath     "can happen at anytime" (03/06/2013)  . Anemia   . H/O hiatal hernia   . GERD (gastroesophageal reflux disease)   . Anxiety   . WPW (Wolff-Parkinson-White syndrome)     [name] 03/06/2013  . High cholesterol   . Migraine     "monthly" (01/30/2014)  . Arthritis     "knees; arms; elbows" (01/30/2014)  . Femoral condyle fracture ([place]) 03/08/2014    left medial/notes 03/09/2014  . Alcoholism /alcohol abuse [facility])    Past Surgical History  Procedure Laterality Date  . Facial fracture surgery Left 1990's    "result of trauma"   . Eye surgery Left 1990's    "result of trauma"   . Left heart catheterization with coronary angiogram Right 03/07/2013    Procedure: LEFT HEART CATHETERIZATION WITH CORONARY ANGIOGRAM;  Surgeon: [name], MD;  Location: [facility] CATH [facility];  Service: Cardiovascular;  Laterality: Right;   Family History  Problem Relation Age of Onset  . Hypertension Other   . Coronary artery disease Other    Social History  Substance Use Topics  .  Smoking status: Current Every Day Smoker -- 1.00 packs/day for 30 years    Types: Cigarettes  . Smokeless tobacco: Current User    Types: Chew  . Alcohol Use: 10.8 oz/week    18 Cans of beer per week     Comment: Last drink Monday 11/10    Review of Systems  Constitutional: Negative for fever and activity change.  Eyes: Negative for discharge and redness.  Respiratory: Negative for cough and shortness of breath.   Cardiovascular: Negative for chest pain.  Gastrointestinal: Positive for nausea, vomiting and abdominal pain.  Genitourinary: Negative for dysuria and urgency.  Musculoskeletal: Negative for arthralgias.  Allergic/Immunologic: Negative for immunocompromised state.  Neurological: Negative for seizures and speech difficulty.  Psychiatric/Behavioral: Negative for behavioral problems and agitation.  All other systems reviewed and are negative.     Allergies  Shellfish-derived products and Trazodone and nefazodone  Home Medications   Prior to Admission medications   Medication Sig Start Date End Date Taking? Authorizing Provider  albuterol (PROVENTIL HFA;VENTOLIN HFA) 108 (90 BASE) MCG/ACT inhaler Inhale 2 puffs into the lungs every 6 (six) hours as needed for wheezing or shortness of breath (wheezing).    Yes Historical Provider, MD  Alum & Mag Hydroxide-Simeth (GI COCKTAIL) SUSP suspension Take 30 mLs by mouth 2 (two) times daily as needed for indigestion. Shake well. 02/16/15  Yes Sherwood Gambler, MD  folic acid (FOLVITE) 1 MG tablet Take 1 tablet (1 mg total) by mouth daily. 01/15/15  Yes [name], MD  lipase/protease/amylase (CREON) 12000 UNITS CPEP capsule Take 2 capsules (24,000 Units total) by mouth 3 (three) times daily with meals. 01/15/15  Yes [name], MD  loperamide (IMODIUM) 2 MG capsule Take 1-2 capsules (2-4 mg total) by mouth as needed for diarrhea or loose stools. 01/23/14  Yes [name], PA-C  metoCLOPramide (REGLAN) 10 MG tablet Take  1 tablet (10 mg total) by mouth every 6 (six) hours. 03/15/15  Yes Shari Upstill, PA-C  metoprolol tartrate (LOPRESSOR) 25 MG tablet Take 1 tablet (25 mg total) by mouth 2 (two) times daily. 01/15/15  Yes [name], MD  Multiple Vitamin (MULTIVITAMIN) capsule Take 1 capsule by mouth daily. 02/06/14  Yes [name], MD  omeprazole (PRILOSEC) 20 MG capsule Take 1 capsule (20 mg total) by mouth daily. 02/08/15  Yes [name], PA-C  oxyCODONE-acetaminophen (PERCOCET/ROXICET) 5-325 MG tablet Take 2 tablets by mouth every 4 (four) hours as needed for severe pain. 02/28/15  Yes Marella Chimes, PA-C  QUEtiapine (SEROQUEL) 50 MG tablet Take 100 mg by mouth at bedtime. 01/15/15  Yes Historical Provider, MD  sertraline (ZOLOFT) 25 MG tablet Take 3 tablets (75 mg total) by mouth daily. 01/15/15  Yes Olugbemiga Essie Christine, MD  sucralfate (CARAFATE) 1 G tablet Take 1 tablet (1 g total) by mouth 4 (four) times daily -  with meals and at bedtime. 03/15/15  Yes Shari Upstill, PA-C  famotidine (PEPCID) 20 MG tablet Take 1 tablet (20 mg total) by mouth 2 (two) times daily. Patient not taking: Reported on 03/05/2015 03/02/15   Orpah Greek, MD  ondansetron (ZOFRAN ODT) 4 MG disintegrating tablet Take 1 tablet (4 mg total) by mouth every 8 (eight) hours as needed for nausea. 02/28/15   Marella Chimes, PA-C  ondansetron (ZOFRAN) 4 MG tablet Take 1 tablet (4 mg total) by mouth every 8 (eight) hours as needed for nausea or vomiting. 03/22/15   Raelin Pixler Lyn Keionte Swicegood, MD  pantoprazole (PROTONIX) 20 MG tablet Take 1 tablet (20 mg total) by mouth daily. Patient not taking: Reported on 03/02/2015 02/28/15   Marella Chimes, PA-C  sildenafil (VIAGRA) 50 MG tablet Take 1 tablet (50 mg total) by mouth daily as needed for erectile dysfunction. Patient not taking: Reported on 03/22/2015 01/15/15   [name], MD  traMADol (ULTRAM) 50 MG tablet Take 1 tablet (50 mg total) by mouth every 8  (eight) hours as needed. Patient not taking: Reported on 03/15/2015 02/17/15   [name], MD   BP 146/87 mmHg  Pulse 98  Temp(Src) 97.7 F (36.5 C) (Oral)  Resp 20  SpO2 98% Physical Exam  Constitutional: He is oriented to person, place, and time. He appears well-nourished.  HENT:  Head: Normocephalic.  Mouth/Throat: Oropharynx is clear and moist.  Eyes: Conjunctivae are normal.  Neck: No tracheal deviation present.  Cardiovascular: Normal rate.   Pulmonary/Chest: Effort normal. No stridor. No respiratory distress.  Abdominal: Soft. There is no tenderness. There is no guarding.  Musculoskeletal: Normal range of motion. He exhibits no edema.  Neurological: He is oriented to person, place, and time. No cranial nerve deficit.  Skin: Skin is warm and dry. No rash noted. He is not diaphoretic.  Psychiatric: He has a normal mood and affect. His behavior is normal.  Nursing note and vitals reviewed.   ED Course  Procedures (including critical care time) Labs Review Labs Reviewed  COMPREHENSIVE METABOLIC PANEL - Abnormal; Notable for the following:    Potassium 5.6 (*)    Total Protein 8.4 (*)    AST 159 (*)    ALT 90 (*)    Total Bilirubin 2.0 (*)    All other components within normal limits  CBC WITH DIFFERENTIAL/PLATELET  LIPASE, BLOOD    Imaging Review No results found. I have personally reviewed and evaluated these images and lab results as part of my medical decision-making.   EKG Interpretation   Date/Time:  Sunday March 22 2015 08:49:55 EST Ventricular Rate:  86 PR Interval:  103 QRS Duration: 72 QT Interval:  314 QTC Calculation: 375 R Axis:   74 Text Interpretation:  Sinus rhythm Short PR interval Biatrial enlargement  Borderline repolarization abnormality no acute ischemia No significant  change since last tracing Confirmed by Gerald Leitz ([id number]) on  03/22/2015 8:56:43 AM      MDM   Final diagnoses:  Chronic abdominal pain     Patient is a 44 year old male with history of chronic pancreatitis presenting today with acute on chronic pancreatitis Patient comes often to the emergency room with a similar complaint. Today he reports mild nausea vomiting with the accompanying abdominal pain. He tried taking his home Percocet but was unable to tolerate it due to his vomiting. Today the patient appears to have normal vital signs, normal physical exam, moist mucous membranes. We will treat him with GI cocktail, antiemetics. We will be trying to avoid IV narcotics in this gentleman.   4:31 PM Controlled with antiemetics, GI cocktail and home meds. Pt taking PO, ambulatory at discharge.    Clydette Privitera Julio Alm, MD 03/22/15 [phone]

## 2015-03-22 NOTE — Discharge Instructions (Signed)
Follow up with your regular phsyician.    Abdominal Pain, Adult Many things can cause abdominal pain. Usually, abdominal pain is not caused by a disease and will improve without treatment. It can often be observed and treated at home. Your health care provider will do a physical exam and possibly order blood tests and X-rays to help determine the seriousness of your pain. However, in many cases, more time must pass before a clear cause of the pain can be found. Before that point, your health care provider may not know if you need more testing or further treatment. HOME CARE INSTRUCTIONS Monitor your abdominal pain for any changes. The following actions may help to alleviate any discomfort you are experiencing:  Only take over-the-counter or prescription medicines as directed by your health care provider.  Do not take laxatives unless directed to do so by your health care provider.  Try a clear liquid diet (broth, tea, or water) as directed by your health care provider. Slowly move to a bland diet as tolerated. SEEK MEDICAL CARE IF:  You have unexplained abdominal pain.  You have abdominal pain associated with nausea or diarrhea.  You have pain when you urinate or have a bowel movement.  You experience abdominal pain that wakes you in the night.  You have abdominal pain that is worsened or improved by eating food.  You have abdominal pain that is worsened with eating fatty foods.  You have a fever. SEEK IMMEDIATE MEDICAL CARE IF:  Your pain does not go away within 2 hours.  You keep throwing up (vomiting).  Your pain is felt only in portions of the abdomen, such as the right side or the left lower portion of the abdomen.  You pass bloody or black tarry stools. MAKE SURE YOU:  Understand these instructions.  Will watch your condition.  Will get help right away if you are not doing well or get worse.   This information is not intended to replace advice given to you by your  health care provider. Make sure you discuss any questions you have with your health care provider.   Document Released: 02/02/2005 Document Revised: 01/14/2015 Document Reviewed: 01/02/2013 Elsevier Interactive Patient Education [employer].

## 2015-03-22 NOTE — ED Notes (Signed)
Pt presents via [facility] from home c/o chronic abdominal pain beginning this AM around 0400.  Pt reports hx: pancreatitis and reports this pain is similar to the past.  Pt also reports N/V, no diarrhea.,  Pt a x 4, NAD.  Pt reports pain medication at home is not helping, last taken 2 hours ago.  Bp 118/76 P-76 R-16 O2-98% RA.

## 2015-03-25 IMAGING — CT CT HEAD W/O CM
1 of 2 series · 15 of 30 positions shown, 19 images · non-contrast
Comparison: CT of the head performed 02/16/2012

CLINICAL DATA: Syncope.

CT HEAD WITHOUT CONTRAST
TECHNIQUE: Contiguous axial images were obtained from the base of
the skull through the vertex without contrast.

[Series 3: recon 2: brain · axial · 0.47mm/px · z∈[+169,+314]mm · 15 of 64 slices shown, 19 images]
[im 4/64  brain]
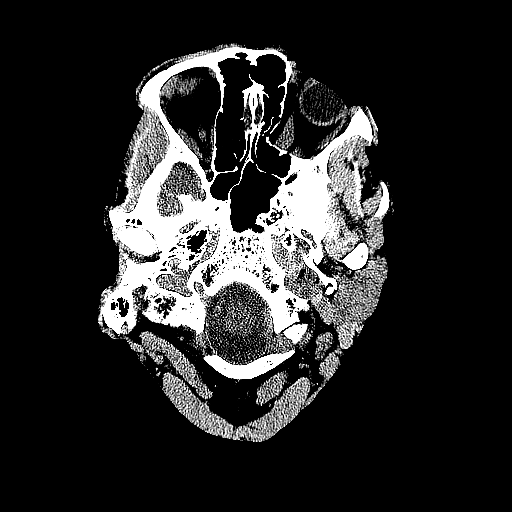
[im 4/64  bone]
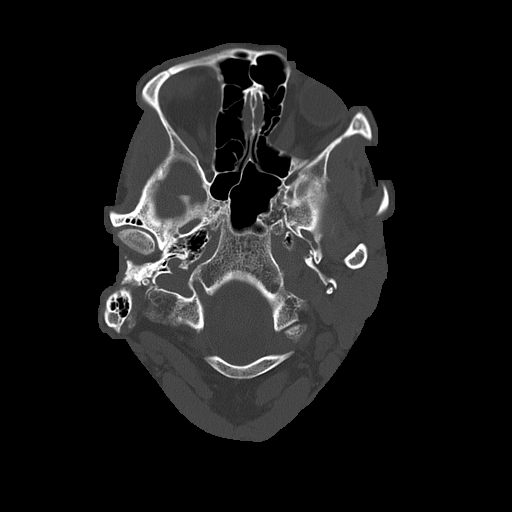
[im 7/64  brain]
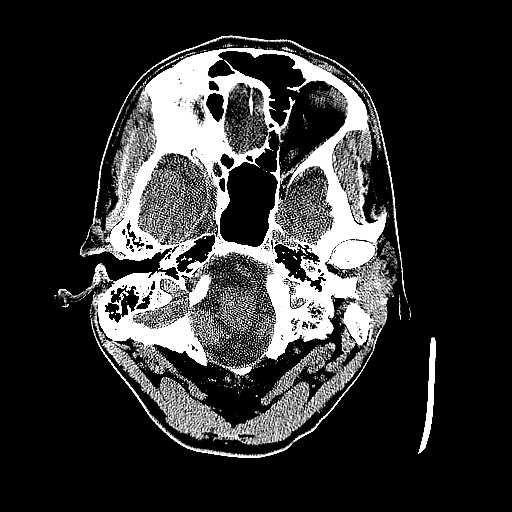
[im 14/64  brain]
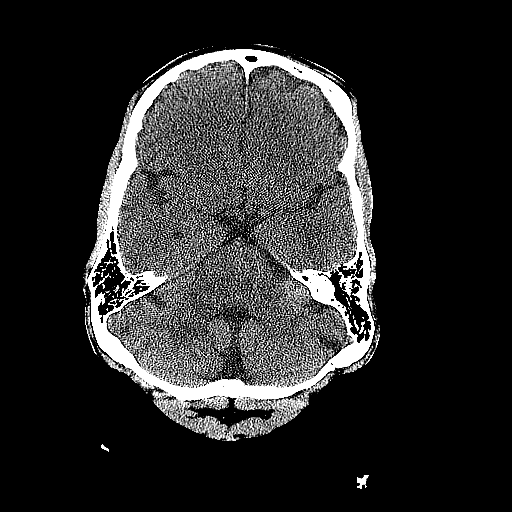
[im 17/64  brain]
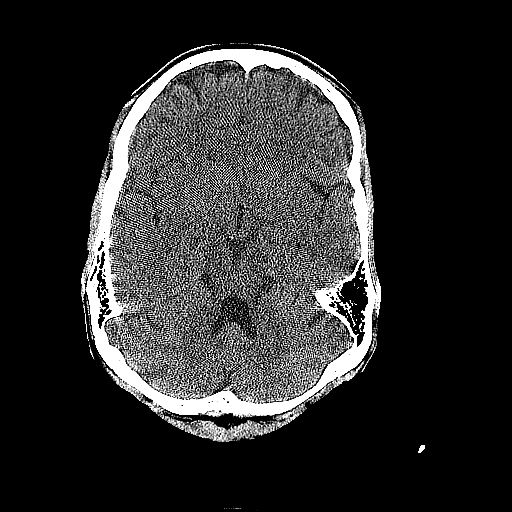
[im 20/64  brain]
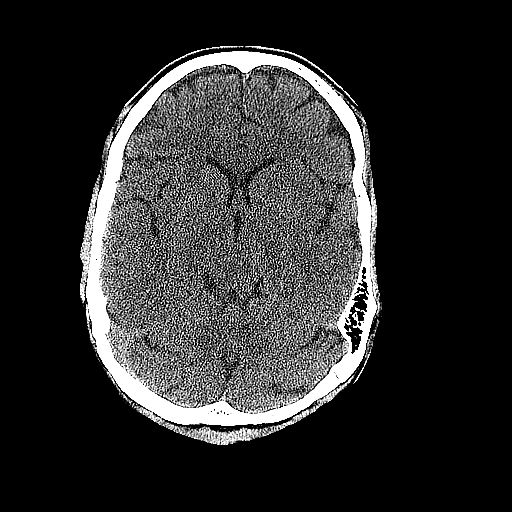
[im 20/64  bone]
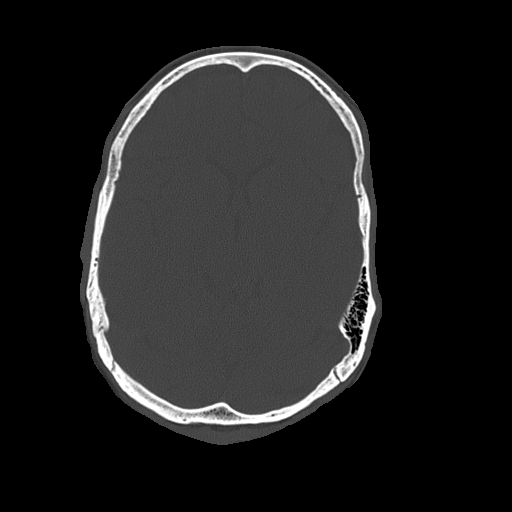
[im 24/64  brain]
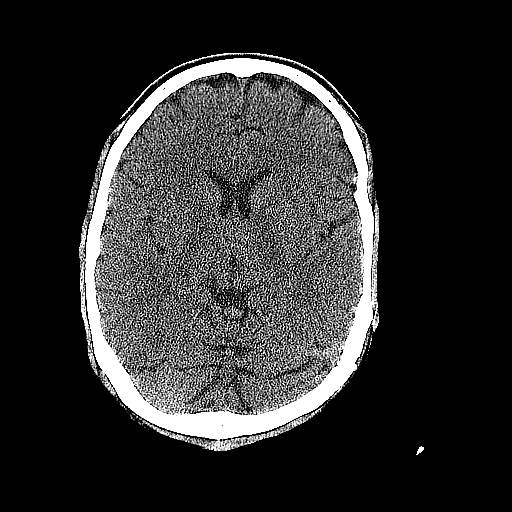
[im 27/64  brain]
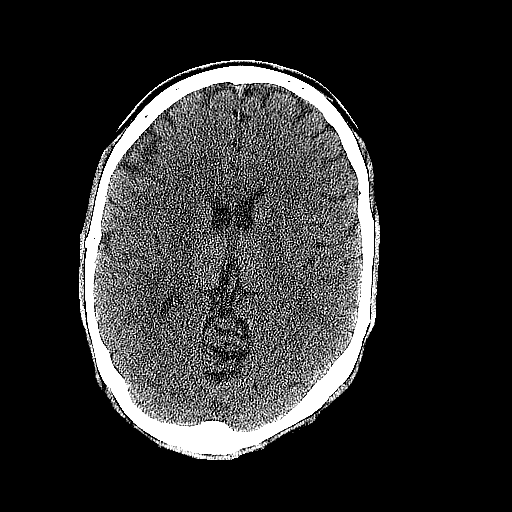
[im 34/64  brain]
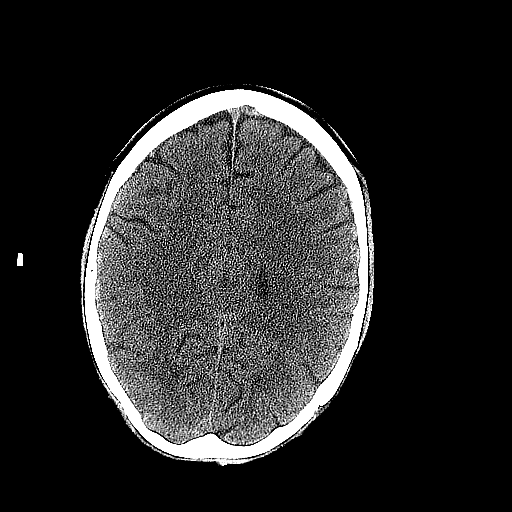
[im 37/64  brain]
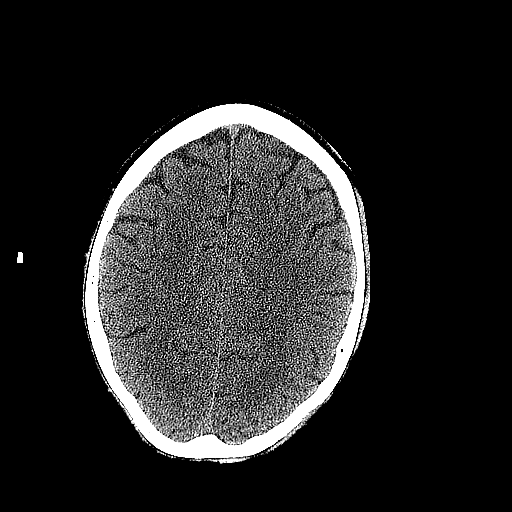
[im 37/64  bone]
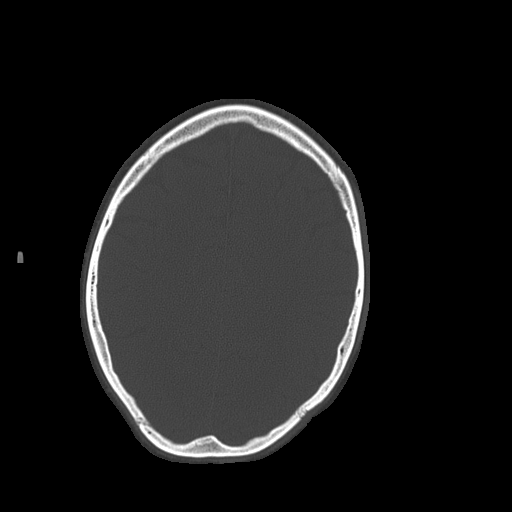
[im 40/64  brain]
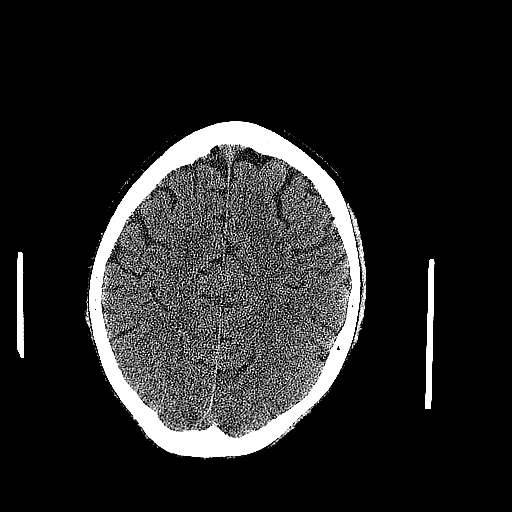
[im 44/64  brain]
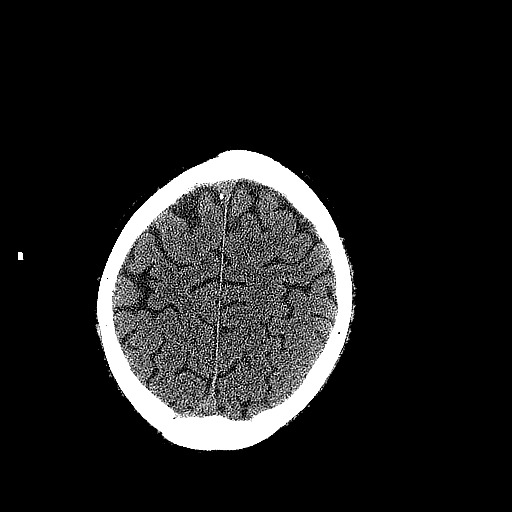
[im 47/64  brain]
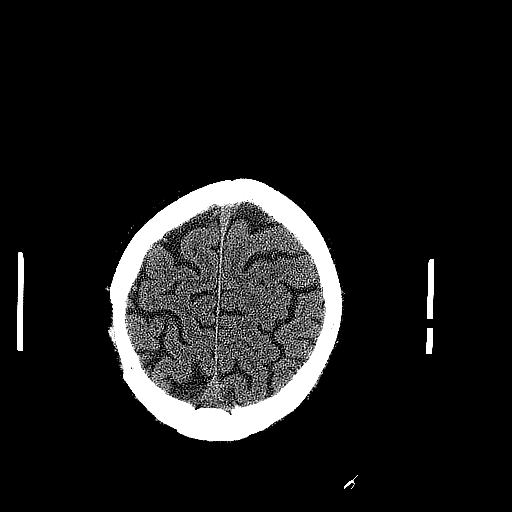
[im 54/64  brain]
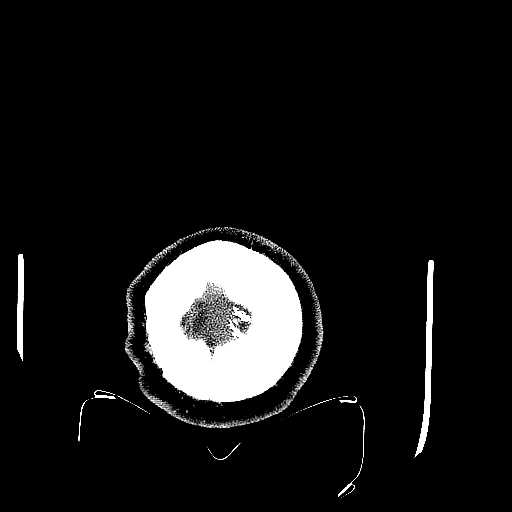
[im 54/64  bone]
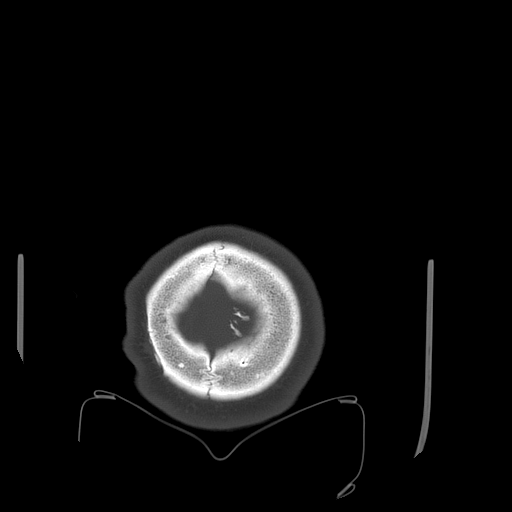
[im 57/64  brain]
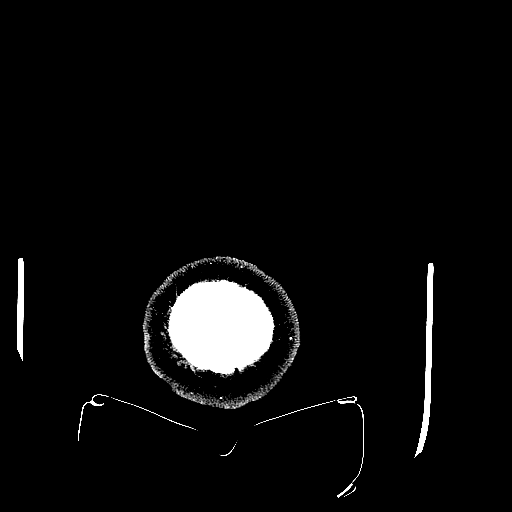
[im 60/64  brain]
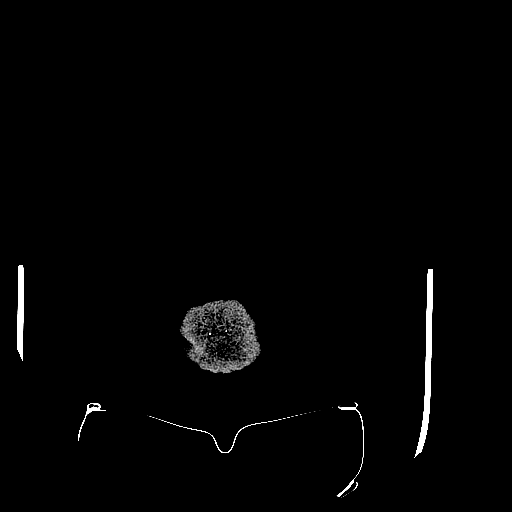

[15 of 30 positions shown; findings below may reference images not displayed]

FINDINGS: There is no evidence of acute infarction, mass lesion, or
intra- or extra-axial hemorrhage on CT.

The posterior fossa, including the cerebellum, brainstem and fourth
ventricle, is within normal limits.  The third and lateral
ventricles, and basal ganglia are unremarkable in appearance.  The
cerebral hemispheres are symmetric in appearance, with normal gray-
white differentiation.  No mass effect or midline shift is seen.

There is no evidence of acute fracture; there is chronic deformity
of the medial wall of the left orbit, reflecting remote blowout
fracture.  Postoperative calvarial thinning is noted on the right
side near the vertex.  The visualized portions of the orbits are
within normal limits.  The paranasal sinuses and mastoid air cells
are well-aerated.  No significant soft tissue abnormalities are
seen.
IMPRESSION: No acute intracranial pathology seen on CT.  Postoperative and post-
traumatic changes again noted.

## 2015-03-26 ENCOUNTER — Inpatient Hospital Stay (HOSPITAL_COMMUNITY): Payer: Self-pay

## 2015-03-26 ENCOUNTER — Encounter (HOSPITAL_COMMUNITY): Payer: Self-pay | Admitting: Emergency Medicine

## 2015-03-26 ENCOUNTER — Inpatient Hospital Stay (HOSPITAL_COMMUNITY)
Admission: EM | Admit: 2015-03-26 | Discharge: 2015-03-30 | DRG: 438 | Disposition: A | Discharge status: Home / Self Care | Payer: Self-pay | Attending: Internal Medicine | Admitting: Internal Medicine

## 2015-03-26 DIAGNOSIS — E440 Moderate protein-calorie malnutrition: Secondary | ICD-10-CM | POA: Insufficient documentation

## 2015-03-26 DIAGNOSIS — Z720 Tobacco use: Secondary | ICD-10-CM | POA: Diagnosis present

## 2015-03-26 DIAGNOSIS — I10 Essential (primary) hypertension: Secondary | ICD-10-CM | POA: Diagnosis present

## 2015-03-26 DIAGNOSIS — F1020 Alcohol dependence, uncomplicated: Secondary | ICD-10-CM | POA: Diagnosis present

## 2015-03-26 DIAGNOSIS — E7800 Pure hypercholesterolemia, unspecified: Secondary | ICD-10-CM | POA: Diagnosis present

## 2015-03-26 DIAGNOSIS — Z8701 Personal history of pneumonia (recurrent): Secondary | ICD-10-CM

## 2015-03-26 DIAGNOSIS — R748 Abnormal levels of other serum enzymes: Secondary | ICD-10-CM | POA: Diagnosis present

## 2015-03-26 DIAGNOSIS — M1990 Unspecified osteoarthritis, unspecified site: Secondary | ICD-10-CM | POA: Diagnosis present

## 2015-03-26 DIAGNOSIS — K219 Gastro-esophageal reflux disease without esophagitis: Secondary | ICD-10-CM | POA: Diagnosis present

## 2015-03-26 DIAGNOSIS — Z8249 Family history of ischemic heart disease and other diseases of the circulatory system: Secondary | ICD-10-CM

## 2015-03-26 DIAGNOSIS — F4310 Post-traumatic stress disorder, unspecified: Secondary | ICD-10-CM | POA: Diagnosis present

## 2015-03-26 DIAGNOSIS — E876 Hypokalemia: Secondary | ICD-10-CM | POA: Diagnosis present

## 2015-03-26 DIAGNOSIS — K859 Acute pancreatitis without necrosis or infection, unspecified: Secondary | ICD-10-CM | POA: Diagnosis present

## 2015-03-26 DIAGNOSIS — F17210 Nicotine dependence, cigarettes, uncomplicated: Secondary | ICD-10-CM | POA: Diagnosis present

## 2015-03-26 DIAGNOSIS — F332 Major depressive disorder, recurrent severe without psychotic features: Secondary | ICD-10-CM | POA: Diagnosis present

## 2015-03-26 DIAGNOSIS — E43 Unspecified severe protein-calorie malnutrition: Secondary | ICD-10-CM | POA: Diagnosis present

## 2015-03-26 DIAGNOSIS — F1910 Other psychoactive substance abuse, uncomplicated: Secondary | ICD-10-CM | POA: Diagnosis present

## 2015-03-26 DIAGNOSIS — E860 Dehydration: Secondary | ICD-10-CM | POA: Diagnosis present

## 2015-03-26 DIAGNOSIS — Z6820 Body mass index (BMI) 20.0-20.9, adult: Secondary | ICD-10-CM

## 2015-03-26 DIAGNOSIS — D573 Sickle-cell trait: Secondary | ICD-10-CM | POA: Diagnosis present

## 2015-03-26 DIAGNOSIS — K852 Alcohol induced acute pancreatitis without necrosis or infection: Principal | ICD-10-CM

## 2015-03-26 DIAGNOSIS — F1210 Cannabis abuse, uncomplicated: Secondary | ICD-10-CM | POA: Diagnosis present

## 2015-03-26 DIAGNOSIS — F1410 Cocaine abuse, uncomplicated: Secondary | ICD-10-CM | POA: Diagnosis present

## 2015-03-26 DIAGNOSIS — K862 Cyst of pancreas: Secondary | ICD-10-CM

## 2015-03-26 DIAGNOSIS — F17200 Nicotine dependence, unspecified, uncomplicated: Secondary | ICD-10-CM

## 2015-03-26 HISTORY — DX: Unspecified chronic bronchitis: J42

## 2015-03-26 HISTORY — DX: Personal history of peptic ulcer disease: Z87.11

## 2015-03-26 HISTORY — DX: Low back pain: M54.5

## 2015-03-26 HISTORY — DX: Family history of other specified conditions: Z84.89

## 2015-03-26 HISTORY — DX: Sickle-cell trait: D57.3

## 2015-03-26 HISTORY — DX: Personal history of other diseases of the digestive system: Z87.19

## 2015-03-26 HISTORY — DX: Bipolar disorder, unspecified: F31.9

## 2015-03-26 HISTORY — DX: Low back pain, unspecified: M54.50

## 2015-03-26 HISTORY — DX: Post-traumatic stress disorder, unspecified: F43.10

## 2015-03-26 HISTORY — DX: Personal history of other medical treatment: Z92.89

## 2015-03-26 HISTORY — DX: Pneumonia, unspecified organism: J18.9

## 2015-03-26 HISTORY — DX: Other chronic pain: G89.29

## 2015-03-26 LAB — COMPREHENSIVE METABOLIC PANEL
ALK PHOS: 105 U/L (ref 38–126)
ALT: 70 U/L — ABNORMAL HIGH (ref 17–63)
AST: 51 U/L — AB (ref 15–41)
Albumin: 3.9 g/dL (ref 3.5–5.0)
Anion gap: 12 (ref 5–15)
BILIRUBIN TOTAL: 0.8 mg/dL (ref 0.3–1.2)
BUN: 10 mg/dL (ref 6–20)
CALCIUM: 9 mg/dL (ref 8.9–10.3)
CO2: 23 mmol/L (ref 22–32)
Chloride: 101 mmol/L (ref 101–111)
Creatinine, Ser: 0.99 mg/dL (ref 0.61–1.24)
GFR calc Af Amer: >60 mL/min (ref >60)
GLUCOSE: 98 mg/dL (ref 65–99)
Potassium: 4.3 mmol/L (ref 3.5–5.1)
Sodium: 136 mmol/L (ref 135–145)
TOTAL PROTEIN: 7.6 g/dL (ref 6.5–8.1)

## 2015-03-26 LAB — CBC WITH DIFFERENTIAL/PLATELET
BASOS ABS: 0 10*3/uL (ref 0.0–0.1)
BASOS PCT: 0 %
Eosinophils Absolute: 0.2 10*3/uL (ref 0.0–0.7)
Eosinophils Relative: 3 %
HEMATOCRIT: 39.8 % (ref 39.0–52.0)
HEMOGLOBIN: 13.3 g/dL (ref 13.0–17.0)
Lymphocytes Relative: 47 %
Lymphs Abs: 3.7 10*3/uL (ref 0.7–4.0)
MCH: 31.9 pg (ref 26.0–34.0)
MCHC: 33.4 g/dL (ref 30.0–36.0)
MCV: 95.4 fL (ref 78.0–100.0)
Monocytes Absolute: 0.4 10*3/uL (ref 0.1–1.0)
Monocytes Relative: 5 %
NEUTROS ABS: 3.4 10*3/uL (ref 1.7–7.7)
NEUTROS PCT: 45 %
Platelets: 217 10*3/uL (ref 150–400)
RBC: 4.17 MIL/uL — ABNORMAL LOW (ref 4.22–5.81)
RDW: 15.3 % (ref 11.5–15.5)
WBC: 7.7 10*3/uL (ref 4.0–10.5)

## 2015-03-26 LAB — FERRITIN: Ferritin: 70 ng/mL (ref 24–336)

## 2015-03-26 LAB — RETICULOCYTES
RBC.: 3.94 MIL/uL — ABNORMAL LOW (ref 4.22–5.81)
Retic Count, Absolute: 51.2 10*3/uL (ref 19.0–186.0)
Retic Ct Pct: 1.3 % (ref 0.4–3.1)

## 2015-03-26 LAB — IRON AND TIBC
IRON: 148 ug/dL (ref 45–182)
SATURATION RATIOS: 34 % (ref 17.9–39.5)
TIBC: 441 ug/dL (ref 250–450)
UIBC: 293 ug/dL

## 2015-03-26 LAB — ETHANOL: Alcohol, Ethyl (B): <5 mg/dL (ref <5)

## 2015-03-26 LAB — LIPASE, BLOOD: Lipase: 212 U/L — ABNORMAL HIGH (ref 11–51)

## 2015-03-26 LAB — FOLATE: FOLATE: 13.2 ng/mL (ref >5.9)

## 2015-03-26 LAB — VITAMIN B12: VITAMIN B 12: 298 pg/mL (ref 180–914)

## 2015-03-26 LAB — TSH: TSH: 0.776 u[IU]/mL (ref 0.350–4.500)

## 2015-03-26 MED ORDER — ENOXAPARIN SODIUM 40 MG/0.4ML ~~LOC~~ SOLN
40.0000 mg | SUBCUTANEOUS | Status: DC
Start: 2015-03-26 — End: 2015-03-30
  Administered 2015-03-26 – 2015-03-29 (×4): 40 mg via SUBCUTANEOUS
  Filled 2015-03-26 (×4): qty 0.4

## 2015-03-26 MED ORDER — SODIUM CHLORIDE 0.9 % IV BOLUS (SEPSIS)
1000.0000 mL | Freq: Once | INTRAVENOUS | Status: AC
  Administered 2015-03-26: 1000 mL via INTRAVENOUS

## 2015-03-26 MED ORDER — ACETAMINOPHEN 650 MG RE SUPP: 650.0000 mg | Freq: Four times a day (QID) | RECTAL | Status: DC | PRN

## 2015-03-26 MED ORDER — OXYCODONE-ACETAMINOPHEN 5-325 MG PO TABS
1.0000 | ORAL_TABLET | Freq: Once | ORAL | Status: AC
  Administered 2015-03-26: 1 via ORAL
  Filled 2015-03-26: qty 1

## 2015-03-26 MED ORDER — IOHEXOL 300 MG/ML  SOLN
100.0000 mL | Freq: Once | INTRAMUSCULAR | Status: AC | PRN
  Administered 2015-03-26: 100 mL via INTRAVENOUS

## 2015-03-26 MED ORDER — GI COCKTAIL ~~LOC~~
30.0000 mL | Freq: Once | ORAL | Status: AC
  Administered 2015-03-26: 30 mL via ORAL
  Filled 2015-03-26: qty 30

## 2015-03-26 MED ORDER — ADULT MULTIVITAMIN W/MINERALS CH
1.0000 | ORAL_TABLET | Freq: Every day | ORAL | Status: DC
  Administered 2015-03-26 – 2015-03-29 (×4): 1 via ORAL
  Filled 2015-03-26 (×5): qty 1

## 2015-03-26 MED ORDER — HYDROMORPHONE HCL 1 MG/ML IJ SOLN
1.0000 mg | Freq: Once | INTRAMUSCULAR | Status: AC
  Administered 2015-03-26: 1 mg via INTRAVENOUS
  Filled 2015-03-26: qty 1

## 2015-03-26 MED ORDER — FOLIC ACID 1 MG PO TABS
1.0000 mg | ORAL_TABLET | Freq: Every day | ORAL | Status: DC
  Administered 2015-03-26 – 2015-03-29 (×4): 1 mg via ORAL
  Filled 2015-03-26 (×4): qty 1

## 2015-03-26 MED ORDER — ONDANSETRON HCL 4 MG/2ML IJ SOLN
4.0000 mg | Freq: Four times a day (QID) | INTRAMUSCULAR | Status: DC
  Administered 2015-03-26 – 2015-03-30 (×16): 4 mg via INTRAVENOUS
  Filled 2015-03-26 (×16): qty 2

## 2015-03-26 MED ORDER — SERTRALINE HCL 50 MG PO TABS
75.0000 mg | ORAL_TABLET | Freq: Every day | ORAL | Status: DC
  Administered 2015-03-26 – 2015-03-29 (×4): 75 mg via ORAL
  Filled 2015-03-26 (×5): qty 2

## 2015-03-26 MED ORDER — VITAMIN B-1 100 MG PO TABS
100.0000 mg | ORAL_TABLET | Freq: Every day | ORAL | Status: DC
  Administered 2015-03-27 – 2015-03-29 (×3): 100 mg via ORAL
  Filled 2015-03-26 (×3): qty 1

## 2015-03-26 MED ORDER — LORAZEPAM 1 MG PO TABS
1.0000 mg | ORAL_TABLET | Freq: Four times a day (QID) | ORAL | Status: AC | PRN
  Administered 2015-03-27 – 2015-03-28 (×4): 1 mg via ORAL
  Filled 2015-03-26 (×5): qty 1

## 2015-03-26 MED ORDER — HYDROMORPHONE HCL 1 MG/ML IJ SOLN
1.0000 mg | INTRAMUSCULAR | Status: DC | PRN
  Administered 2015-03-26 – 2015-03-27 (×6): 2 mg via INTRAVENOUS
  Administered 2015-03-27: 1 mg via INTRAVENOUS
  Administered 2015-03-27 (×2): 2 mg via INTRAVENOUS
  Filled 2015-03-26 (×7): qty 2
  Filled 2015-03-26: qty 1
  Filled 2015-03-26 (×2): qty 2

## 2015-03-26 MED ORDER — METOPROLOL TARTRATE 25 MG PO TABS
25.0000 mg | ORAL_TABLET | Freq: Two times a day (BID) | ORAL | Status: DC
  Administered 2015-03-26 – 2015-03-29 (×7): 25 mg via ORAL
  Filled 2015-03-26 (×7): qty 1

## 2015-03-26 MED ORDER — LORAZEPAM 2 MG/ML IJ SOLN: 1.0000 mg | Freq: Four times a day (QID) | INTRAMUSCULAR | Status: AC | PRN

## 2015-03-26 MED ORDER — LORAZEPAM 2 MG/ML IJ SOLN
0.0000 mg | Freq: Two times a day (BID) | INTRAMUSCULAR | Status: AC
  Administered 2015-03-28 – 2015-03-29 (×2): 2 mg via INTRAVENOUS
  Administered 2015-03-29: 1 mg via INTRAVENOUS
  Administered 2015-03-30: 4 mg via INTRAVENOUS
  Filled 2015-03-26: qty 2
  Filled 2015-03-26 (×3): qty 1

## 2015-03-26 MED ORDER — SODIUM CHLORIDE 0.9 % IV SOLN
INTRAVENOUS | Status: DC
  Administered 2015-03-26: 22:00:00 via INTRAVENOUS
  Administered 2015-03-26: 150 mL/h via INTRAVENOUS
  Administered 2015-03-27: 05:00:00 via INTRAVENOUS

## 2015-03-26 MED ORDER — ONDANSETRON HCL 4 MG/2ML IJ SOLN
4.0000 mg | Freq: Once | INTRAMUSCULAR | Status: AC
  Administered 2015-03-26: 4 mg via INTRAVENOUS
  Filled 2015-03-26: qty 2

## 2015-03-26 MED ORDER — ALBUTEROL SULFATE (2.5 MG/3ML) 0.083% IN NEBU: 2.5000 mg | INHALATION_SOLUTION | Freq: Four times a day (QID) | RESPIRATORY_TRACT | Status: DC | PRN

## 2015-03-26 MED ORDER — PROMETHAZINE HCL 25 MG/ML IJ SOLN
25.0000 mg | Freq: Four times a day (QID) | INTRAMUSCULAR | Status: DC | PRN
  Administered 2015-03-26 – 2015-03-30 (×6): 25 mg via INTRAVENOUS
  Filled 2015-03-26 (×6): qty 1

## 2015-03-26 MED ORDER — ACETAMINOPHEN 325 MG PO TABS
650.0000 mg | ORAL_TABLET | Freq: Four times a day (QID) | ORAL | Status: DC | PRN
Start: 2015-03-26 — End: 2015-03-30

## 2015-03-26 MED ORDER — LORAZEPAM 2 MG/ML IJ SOLN
0.0000 mg | Freq: Four times a day (QID) | INTRAMUSCULAR | Status: AC
  Administered 2015-03-26 (×2): 1 mg via INTRAVENOUS
  Administered 2015-03-27: 4 mg via INTRAVENOUS
  Administered 2015-03-27 (×2): 2 mg via INTRAVENOUS
  Administered 2015-03-28: 1 mg via INTRAVENOUS
  Administered 2015-03-28: 2 mg via INTRAVENOUS
  Filled 2015-03-26: qty 1
  Filled 2015-03-26: qty 2
  Filled 2015-03-26 (×5): qty 1

## 2015-03-26 MED ORDER — PANTOPRAZOLE SODIUM 40 MG PO TBEC
40.0000 mg | DELAYED_RELEASE_TABLET | Freq: Every day | ORAL | Status: DC
  Administered 2015-03-26 – 2015-03-29 (×4): 40 mg via ORAL
  Filled 2015-03-26 (×4): qty 1

## 2015-03-26 MED ORDER — THIAMINE HCL 100 MG/ML IJ SOLN
100.0000 mg | Freq: Every day | INTRAMUSCULAR | Status: DC
  Administered 2015-03-26: 100 mg via INTRAVENOUS
  Filled 2015-03-26 (×3): qty 2

## 2015-03-26 MED ORDER — QUETIAPINE FUMARATE 100 MG PO TABS
100.0000 mg | ORAL_TABLET | Freq: Every day | ORAL | Status: DC
  Administered 2015-03-26 – 2015-03-29 (×4): 100 mg via ORAL
  Filled 2015-03-26 (×4): qty 1

## 2015-03-26 MED ORDER — ALBUTEROL SULFATE HFA 108 (90 BASE) MCG/ACT IN AERS: 2.0000 | INHALATION_SPRAY | Freq: Four times a day (QID) | RESPIRATORY_TRACT | Status: DC | PRN

## 2015-03-26 NOTE — ED Provider Notes (Signed)
Medical screening examination/treatment/procedure(s) were conducted as a shared visit with non-physician practitioner(s) and myself.  I personally evaluated the patient during the encounter.  Pt has history of recurrent pancreatitis and alcohol abuse.  Pt presented with a recurrent exacerbation.  Multiple visits to the ED for these episodes.   Labs show elevated lipase.  Pt has not been able to keep down fluids.  Continues to complain of severe pain despite treatment. Considering he has objective evidence of recurrent exacerbation will consult for admission.  Dorie Rank, MD 03/26/15 [phone]

## 2015-03-26 NOTE — ED Notes (Signed)
Per EMS, patient has a history of pancreatitis, patient drank alcohol last night and started having abdominal pain and N/V.   Patient states he was already hurting yesterday and feels like his pancreatitis.   Patient states nausea and vomiting today.

## 2015-03-26 NOTE — ED Notes (Signed)
Upon starting shift, noted that no assessment had been done on pt

## 2015-03-26 NOTE — ED Provider Notes (Signed)
CSN: [id number]     Arrival date & time 03/26/15  1016 History   First MD Initiated Contact with Patient 03/26/15 1019     Chief Complaint  Patient presents with  . Abdominal Pain  . Nausea  . Emesis   HPI   [name] is an 44 y.o. male with h/o pancreatitis, cocainea buse, HTN, HLD, alcohol abuse who presents to the ED for evaluation of abdominal pain, nausea, and vomiting. He is well known to this ED and comes in frequently for acute on chronic pancreatitis flairs. He states his current presentation feels like one of his flairs. States that since yesterday he has had N/V and been unable to tolerate taking his home meds as he has been throwing up. Records indicate he had percocet #30 filled on 10/28. He currently endorses sharp LUQ pain, nausea, and vomiting. Last episode of emesis 1h PTA. States the emesis is NBNB. Denies diarrhea. Denies fever, chills, flank pain, chest pain, SOB. He states he is in the process of establishing GI care. Admits smoking 1.5ppd, drinking 4-5 beers a night, and MJ use each weekend. Denies cocaine use though has a history of abuse and positive cocaine on UDS earlier this month.   Past Medical History  Diagnosis Date  . Hypertension   . Asthma   . Pancreatitis   . Cocaine abuse   . Depression   . H/O suicide attempt   . Heart murmur     "when he was little" (03/06/2013)  . Shortness of breath     "can happen at anytime" (03/06/2013)  . Anemia   . H/O hiatal hernia   . GERD (gastroesophageal reflux disease)   . Anxiety   . WPW (Wolff-Parkinson-White syndrome)     [name] 03/06/2013  . High cholesterol   . Migraine     "monthly" (01/30/2014)  . Arthritis     "knees; arms; elbows" (01/30/2014)  . Femoral condyle fracture ([place]) 03/08/2014    left medial/notes 03/09/2014  . Alcoholism /alcohol abuse [facility])    Past Surgical History  Procedure Laterality Date  . Facial fracture surgery Left 1990's    "result of trauma"   . Eye surgery Left 1990's   "result of trauma"   . Left heart catheterization with coronary angiogram Right 03/07/2013    Procedure: LEFT HEART CATHETERIZATION WITH CORONARY ANGIOGRAM;  Surgeon: [name], MD;  Location: [place] CATH LAB;  Service: Cardiovascular;  Laterality: Right;   Family History  Problem Relation Age of Onset  . Hypertension Other   . Coronary artery disease Other    Social History  Substance Use Topics  . Smoking status: Current Every Day Smoker -- 1.00 packs/day for 30 years    Types: Cigarettes  . Smokeless tobacco: Current User    Types: Chew  . Alcohol Use: 10.8 oz/week    18 Cans of beer per week     Comment: Last drink 11/16    Review of Systems  All other systems reviewed and are negative.     Allergies  Shellfish-derived products and Trazodone and nefazodone  Home Medications   Prior to Admission medications   Medication Sig Start Date End Date Taking? Authorizing Provider  albuterol (PROVENTIL HFA;VENTOLIN HFA) 108 (90 BASE) MCG/ACT inhaler Inhale 2 puffs into the lungs every 6 (six) hours as needed for wheezing or shortness of breath (wheezing).     Historical Provider, MD  Alum & Mag Hydroxide-Simeth (GI COCKTAIL) SUSP suspension Take 30 mLs by mouth 2 (  two) times daily as needed for indigestion. Shake well. 02/16/15   Sherwood Gambler, MD  famotidine (PEPCID) 20 MG tablet Take 1 tablet (20 mg total) by mouth 2 (two) times daily. Patient not taking: Reported on 03/05/2015 03/02/15   Orpah Greek, MD  folic acid (FOLVITE) 1 MG tablet Take 1 tablet (1 mg total) by mouth daily. 01/15/15   [name], MD  lipase/protease/amylase (CREON) 12000 UNITS CPEP capsule Take 2 capsules (24,000 Units total) by mouth 3 (three) times daily with meals. 01/15/15   [name], MD  loperamide (IMODIUM) 2 MG capsule Take 1-2 capsules (2-4 mg total) by mouth as needed for diarrhea or loose stools. 01/23/14   [name], PA-C  metoCLOPramide (REGLAN) 10 MG tablet Take  1 tablet (10 mg total) by mouth every 6 (six) hours. 03/15/15   Charlann Lange, PA-C  metoprolol tartrate (LOPRESSOR) 25 MG tablet Take 1 tablet (25 mg total) by mouth 2 (two) times daily. 01/15/15   [name], MD  Multiple Vitamin (MULTIVITAMIN) capsule Take 1 capsule by mouth daily. 02/06/14   [name], MD  omeprazole (PRILOSEC) 20 MG capsule Take 1 capsule (20 mg total) by mouth daily. 02/08/15   [name], PA-C  ondansetron (ZOFRAN ODT) 4 MG disintegrating tablet Take 1 tablet (4 mg total) by mouth every 8 (eight) hours as needed for nausea. 02/28/15   Marella Chimes, PA-C  ondansetron (ZOFRAN) 4 MG tablet Take 1 tablet (4 mg total) by mouth every 8 (eight) hours as needed for nausea or vomiting. 03/22/15   Courteney Lyn Mackuen, MD  oxyCODONE-acetaminophen (PERCOCET/ROXICET) 5-325 MG tablet Take 2 tablets by mouth every 4 (four) hours as needed for severe pain. 02/28/15   Marella Chimes, PA-C  pantoprazole (PROTONIX) 20 MG tablet Take 1 tablet (20 mg total) by mouth daily. Patient not taking: Reported on 03/02/2015 02/28/15   Marella Chimes, PA-C  QUEtiapine (SEROQUEL) 50 MG tablet Take 100 mg by mouth at bedtime. 01/15/15   Historical Provider, MD  sertraline (ZOLOFT) 25 MG tablet Take 3 tablets (75 mg total) by mouth daily. 01/15/15   [name], MD  sildenafil (VIAGRA) 50 MG tablet Take 1 tablet (50 mg total) by mouth daily as needed for erectile dysfunction. Patient not taking: Reported on 03/22/2015 01/15/15   [name], MD  sucralfate (CARAFATE) 1 G tablet Take 1 tablet (1 g total) by mouth 4 (four) times daily -  with meals and at bedtime. 03/15/15   Charlann Lange, PA-C  traMADol (ULTRAM) 50 MG tablet Take 1 tablet (50 mg total) by mouth every 8 (eight) hours as needed. Patient not taking: Reported on 03/15/2015 02/17/15   [name], MD   BP 127/100 mmHg  Pulse 87  Temp(Src) 98.5 F (36.9 C) (Oral)  Resp 18  Ht 5\' 9"  (1.753  m)  Wt 138 lb (62.596 kg)  BMI 20.37 kg/m2  SpO2 100% Physical Exam  Constitutional: He is oriented to person, place, and time.  Pt is moaning in pain  HENT:  Right Ear: External ear normal.  Left Ear: External ear normal.  Nose: Nose normal.  Mouth/Throat: Oropharynx is clear and moist. No oropharyngeal exudate.  Eyes: Conjunctivae and EOM are normal. Pupils are equal, round, and reactive to light.  Neck: Normal range of motion. Neck supple. No tracheal deviation present.  Cardiovascular: Normal rate, regular rhythm, normal heart sounds and intact distal pulses.   No murmur heard. Pulmonary/Chest:  Effort normal and breath sounds normal. No respiratory distress. He has no wheezes.  Abdominal: Soft. Bowel sounds are normal. He exhibits no distension. There is tenderness in the epigastric area and left upper quadrant. There is no rebound and no guarding.  Musculoskeletal: Normal range of motion. He exhibits no edema.  Lymphadenopathy:    He has no cervical adenopathy.  Neurological: He is alert and oriented to person, place, and time. No cranial nerve deficit.  Skin: Skin is warm and dry.  Nursing note and vitals reviewed.   ED Course  Procedures (including critical care time) Labs Review Labs Reviewed  CBC WITH DIFFERENTIAL/PLATELET - Abnormal; Notable for the following:    RBC 4.17 (*)    All other components within normal limits  COMPREHENSIVE METABOLIC PANEL - Abnormal; Notable for the following:    AST 51 (*)    ALT 70 (*)    All other components within normal limits  LIPASE, BLOOD - Abnormal; Notable for the following:    Lipase 212 (*)    All other components within normal limits  ETHANOL  URINE RAPID DRUG SCREEN, HOSP PERFORMED    Imaging Review No results found. I have personally reviewed and evaluated these images and lab results as part of my medical decision-making.   EKG Interpretation None      MDM   Final diagnoses:  None    Will check lipase,  cbc, cmp. Discussed with pt that i can give him anti-emetics, GI cocktail which have worked well for him in the past. I told him that I can give those first and then give home med (po percocet) but we will be avoiding IV pain medication today. Pt is in agreement.   Lipase elevated to 212 today. Pt reports no relief with percocet. Will give 1L bolus and 1mg  dilaudid.   Pt reports pain from 10/10 to 8/10 with 1mg  dilaudid. Will give 1mg  dilaudid again. Will fluid challenge.  Pt continues with severe pain even with 2mg  total dilaudid and 1 percocet. Pt could not tolerate ice chips. Discussed with attending MD Tomi Bamberger. Will call for admission for acute on chronic pancreatitis.   Anne Ng, PA-C 03/26/15 [phone]

## 2015-03-26 NOTE — ED Notes (Signed)
Ice chips given

## 2015-03-26 NOTE — H&P (Signed)
[facility] History and Physical                                                                                    [name], is a 44 y.o. male  MRN: [id number]   DOB - 1971/05/04  Admit Date - 03/26/2015  Outpatient Primary MD for the patient is [name], NP  Referring MD: Tomi Bamberger / ER  With History of -  Past Medical History  Diagnosis Date  . Hypertension   . Asthma   . Pancreatitis   . Cocaine abuse   . Depression   . H/O suicide attempt   . Heart murmur     "when he was little" (03/06/2013)  . Shortness of breath     "can happen at anytime" (03/06/2013)  . Anemia   . H/O hiatal hernia   . GERD (gastroesophageal reflux disease)   . Anxiety   . WPW (Wolff-Parkinson-White syndrome)     [name] 03/06/2013  . High cholesterol   . Migraine     "monthly" (01/30/2014)  . Arthritis     "knees; arms; elbows" (01/30/2014)  . Femoral condyle fracture ([place]) 03/08/2014    left medial/notes 03/09/2014  . Alcoholism /alcohol abuse [facility])       Past Surgical History  Procedure Laterality Date  . Facial fracture surgery Left 1990's    "result of trauma"   . Eye surgery Left 1990's    "result of trauma"   . Left heart catheterization with coronary angiogram Right 03/07/2013    Procedure: LEFT HEART CATHETERIZATION WITH CORONARY ANGIOGRAM;  Surgeon: [name], MD;  Location: [facility];  Service: Cardiovascular;  Laterality: Right;    in for   Chief Complaint  Patient presents with  . Abdominal Pain  . Nausea  . Emesis     HPI This is a 44 year old male patient with a long-standing history of alcohol and polysubstance abuse with cyclic episodes of abstinence. He also has a history of recurrent pancreatitis with pseudocysts as well as hypertension and depressive disorder. In the past 12 months he reports another relapse of increased drinking which he attributes to depression related to girlfriend having an abortion. He reports because of this he began  drinking heavily and actually had a suicide attempt leading to inpatient hospitalization and eventual outpatient treatment at [facility]. Patient reports he typically drinks anywhere from a 6 pack to a 12 pack per day. He states he did have some moonshine this past Tuesday. He reports in the past 3 months he's lost greater than 20 pounds. In regards to current symptoms they began yesterday evening and became progressive in nature. He has vomited at least 5 times and has had 2 episodes of diarrhea which she treated with Imodium. He has not had any fevers or chills. He reports his last drink was at 3 AM and was beer. Because of his symptoms he reported to the ER for treatment. Of note the patient is a frequent flyer in the ER and has been evaluated at least 18 times in 2016 including today's visit. He has been admitted at least 3 times.  In the ER he was  afebrile, initial blood pressure was 127/100 with pulse 87 respirations 18 and her saturations to 100%. Initial CIWA score 5. Noted to have significant abdominal pain and intractable nausea and vomiting while in the ER. He is been given Zofran, GI cocktail, Percocet, Dilaudid and normal saline without any improvement in his symptoms. Laboratory data unremarkable except for lipase 212, AST 51 and ALT 70 with normal total bilirubin, CBC unremarkable. Alcohol level as well as urine drug screen pending.   Review of Systems   In addition to the HPI above,  No Fever-chills, myalgias or other constitutional symptoms No Headache, changes with Vision or hearing, new weakness, tingling, numbness in any extremity, No problems swallowing food or Liquids, indigestion/reflux No Chest pain, Cough or Shortness of Breath, palpitations, orthopnea or DOE No melena or hematochezia, no dark tarry stools, Bowel movements are regular, No dysuria, hematuria or flank pain No new skin rashes, lesions, masses or bruises, No new joints pains-aches No polyuria, polydypsia or  polyphagia,  *A full 10 point Review of Systems was done, except as stated above, all other Review of Systems were negative.  Social History Social History  Substance Use Topics  . Smoking status: Current Every Day Smoker -- 1.00 packs/day for 30 years    Types: Cigarettes  . Smokeless tobacco: Current User    Types: Chew  . Alcohol Use: 6-12 BEERS DAILY             Resides at: Private residence  Lives with: His mother  Ambulatory status: Without assistive devices   Family History Family History  Problem Relation Age of Onset  . Hypertension Other   . Coronary artery disease Other      Prior to Admission medications   Medication Sig Start Date End Date Taking? Authorizing Provider  folic acid (FOLVITE) 1 MG tablet Take 1 tablet (1 mg total) by mouth daily. 01/15/15  Yes [name], MD  lipase/protease/amylase (CREON) 12000 UNITS CPEP capsule Take 2 capsules (24,000 Units total) by mouth 3 (three) times daily with meals. 01/15/15  Yes [name], MD  metoCLOPramide (REGLAN) 10 MG tablet Take 1 tablet (10 mg total) by mouth every 6 (six) hours. 03/15/15  Yes Shari Upstill, PA-C  metoprolol tartrate (LOPRESSOR) 25 MG tablet Take 1 tablet (25 mg total) by mouth 2 (two) times daily. 01/15/15  Yes [name], MD  Multiple Vitamin (MULTIVITAMIN) capsule Take 1 capsule by mouth daily. 02/06/14  Yes [name], MD  omeprazole (PRILOSEC) 20 MG capsule Take 1 capsule (20 mg total) by mouth daily. 02/08/15  Yes [name], PA-C  QUEtiapine (SEROQUEL) 50 MG tablet Take 100 mg by mouth at bedtime. 01/15/15  Yes Historical Provider, MD  sertraline (ZOLOFT) 25 MG tablet Take 3 tablets (75 mg total) by mouth daily. 01/15/15  Yes Olugbemiga Essie Christine, MD  sucralfate (CARAFATE) 1 G tablet Take 1 tablet (1 g total) by mouth 4 (four) times daily -  with meals and at bedtime. 03/15/15  Yes Shari Upstill, PA-C  albuterol (PROVENTIL HFA;VENTOLIN HFA) 108 (90 BASE) MCG/ACT  inhaler Inhale 2 puffs into the lungs every 6 (six) hours as needed for wheezing or shortness of breath (wheezing).     Historical Provider, MD  Alum & Mag Hydroxide-Simeth (GI COCKTAIL) SUSP suspension Take 30 mLs by mouth 2 (two) times daily as needed for indigestion. Shake well. Patient not taking: Reported on 03/26/2015 02/16/15   Sherwood Gambler, MD  famotidine (PEPCID) 20 MG tablet Take 1  tablet (20 mg total) by mouth 2 (two) times daily. Patient not taking: Reported on 03/05/2015 03/02/15   Orpah Greek, MD  loperamide (IMODIUM) 2 MG capsule Take 1-2 capsules (2-4 mg total) by mouth as needed for diarrhea or loose stools. 01/23/14   [name], PA-C  ondansetron (ZOFRAN ODT) 4 MG disintegrating tablet Take 1 tablet (4 mg total) by mouth every 8 (eight) hours as needed for nausea. 02/28/15   Marella Chimes, PA-C  ondansetron (ZOFRAN) 4 MG tablet Take 1 tablet (4 mg total) by mouth every 8 (eight) hours as needed for nausea or vomiting. Patient not taking: Reported on 03/26/2015 03/22/15   Courteney Lyn Mackuen, MD  oxyCODONE-acetaminophen (PERCOCET/ROXICET) 5-325 MG tablet Take 2 tablets by mouth every 4 (four) hours as needed for severe pain. 02/28/15   Marella Chimes, PA-C  pantoprazole (PROTONIX) 20 MG tablet Take 1 tablet (20 mg total) by mouth daily. Patient not taking: Reported on 03/02/2015 02/28/15   Marella Chimes, PA-C  sildenafil (VIAGRA) 50 MG tablet Take 1 tablet (50 mg total) by mouth daily as needed for erectile dysfunction. Patient not taking: Reported on 03/22/2015 01/15/15   [name], MD  traMADol (ULTRAM) 50 MG tablet Take 1 tablet (50 mg total) by mouth every 8 (eight) hours as needed. Patient not taking: Reported on 03/15/2015 02/17/15   [name], MD    Allergies  Allergen Reactions  . Shellfish-Derived Products Nausea And Vomiting  . Trazodone And Nefazodone Other (See Comments)    Muscle spasms    Physical  Exam  Vitals  Blood pressure 156/97, pulse 80, temperature 98.5 F (36.9 C), temperature source Oral, resp. rate 18, height 5\' 9"  (1.753 m), weight 138 lb (62.596 kg), SpO2 100 %.   General:  In no acute distress, appears well-nourished and older than stated age  Psych: Flat affect, Denies Suicidal or Homicidal ideations, Awake Alert, Oriented X 3. Speech and thought patterns are clear and appropriate, no apparent short term memory deficits  Neuro:   No focal neurological deficits, CN II through XII intact, Strength 5/5 all 4 extremities, Sensation intact all 4 extremities.  ENT:  Ears and Eyes appear Normal, Conjunctivae clear, PER. Dry oral mucosa without erythema or exudates.  Neck:  Supple, No lymphadenopathy appreciated  Respiratory:  Symmetrical chest wall movement, Good air movement bilaterally, CTAB. Room Air  Cardiac:  RRR, No Murmurs, no LE edema noted, no JVD, No carotid bruits, peripheral pulses palpable at 2+  Abdomen:  Positive bowel sounds, Soft, diffusely tender with more focal tenderness noted in left upper quadrant, Non distended,  No masses appreciated  Skin:  No Cyanosis, Normal Skin Turgor, No Skin Rash or Bruise.  Extremities: Symmetrical without obvious trauma or injury,  no effusions.  Data Review  CBC  Recent Labs Lab 03/22/15 0910 03/26/15 1100  WBC 6.4 7.7  HGB 14.0 13.3  HCT 41.6 39.8  PLT 253 217  MCV 95.0 95.4  MCH 32.0 31.9  MCHC 33.7 33.4  RDW 15.3 15.3  LYMPHSABS 2.3 3.7  MONOABS 0.6 0.4  EOSABS 0.2 0.2  BASOSABS 0.0 0.0    Chemistries   Recent Labs Lab 03/22/15 0910 03/26/15 1100  NA 136 136  K 5.6* 4.3  CL 102 101  CO2 24 23  GLUCOSE 79 98  BUN 6 10  CREATININE 0.68 0.99  CALCIUM 9.1 9.0  AST 159* 51*  ALT 90* 70*  ALKPHOS 116 105  BILITOT 2.0* 0.8  estimated creatinine clearance is 83.4 mL/min (by C-G formula based on Cr of 0.99).  No results for input(s): TSH, T4TOTAL, T3FREE, THYROIDAB in the last 72  hours.  Invalid input(s): FREET3  Coagulation profile No results for input(s): INR, PROTIME in the last 168 hours.  No results for input(s): DDIMER in the last 72 hours.  Cardiac Enzymes No results for input(s): CKMB, TROPONINI, MYOGLOBIN in the last 168 hours.  Invalid input(s): CK  Invalid input(s): POCBNP  Urinalysis    Component Value Date/Time   COLORURINE YELLOW 03/15/2015 0508   APPEARANCEUR CLEAR 03/15/2015 0508   LABSPEC 1.025 03/15/2015 0508   PHURINE 5.5 03/15/2015 0508   GLUCOSEU NEGATIVE 03/15/2015 0508   HGBUR NEGATIVE 03/15/2015 0508   HGBUR negative 04/30/2010 1020   BILIRUBINUR NEGATIVE 03/15/2015 0508   KETONESUR NEGATIVE 03/15/2015 0508   PROTEINUR NEGATIVE 03/15/2015 0508   UROBILINOGEN 0.2 03/15/2015 0508   NITRITE NEGATIVE 03/15/2015 0508   LEUKOCYTESUR NEGATIVE 03/15/2015 0508    Imaging results:   Dg Chest 2 View  03/05/2015  CLINICAL DATA:  Chest pain, abdominal pain for 3 days, cough EXAM: CHEST  2 VIEW COMPARISON:  11/26/2014 FINDINGS: Cardiomediastinal silhouette is stable. No acute infiltrate or pleural effusion. No pulmonary edema. Bony thorax is unremarkable. IMPRESSION: No active cardiopulmonary disease. Electronically Signed   By: Lahoma Crocker M.D.   On: 03/05/2015 08:42     Assessment & Plan  Principal Problem:   Alcohol-induced acute pancreatitis -Medical surgical floor -NPO except for ice chips -IV fluids -IV Dilaudid prn -Scheduled Zofran with prn Phenergan -Abdominal ultrasound completed on 02/08/15 revealed a small 1.1 x 0.9 x 1.2 cm hypoechoic nodular lesion in the body of the pancreas with a small infected pseudocyst unable to be excluded-with ongoing alcohol abuse and recurrent abdominal pain so we'll obtain CT abdomen to better clarify -Does not have fever or leukocytosis at this juncture -No significant hyperglycemia so appears to have intact pancreatic function regarding insulin production -Was very frank with patient  regarding sequelae of ongoing alcohol use including pancreatic failure leading to diabetes and ultimately death potentially in the next 10 years  Active Problems:   Alcohol dependence (HCC)/polysubstance abuse -History of using beer as well as recent ingestion of moonshine -Prior urine drug screen positive for cocaine and marijuana; current drug screen pending -Counseled as above -CIWA    Abnormal transaminases -Secondary to alcohol use and likely evolving dehydration -No significant elevations so would repeat transaminases prior to discharge    MDD (major depressive disorder), recurrent severe, without psychosis ([place]) -Patient reports this as precipitating factor for ongoing substance abuse; again counseled about medication and therapy compliance pointing out that only he can make a decision to overcome these issues and that medication alone will not fix his problems or his mindset -Continue preadmission Zoloft and Seroquel    Essential hypertension -Continue preadmission Lopressor    Protein-calorie malnutrition, severe ([place]) -Secondary to ongoing alcohol abuse and recurrent chronic pancreatitis -Nutrition consultation -Resume pancreatic enzymes when diet advanced -Check anemia panel and TSH    TOBACCO ABUSE -Counseled regarding cessation    DVT Prophylaxis: Lovenox  Family Communication:   No family at bedside  Code Status:  Full code  Condition:  Stable  Discharge disposition: Anticipate discharge back to home environment; in the past patient has requested inpatient treatment options and may benefit from at least psych social worker evaluation prior to discharge-suspect he needs to return to other [facility] or [place] for continued outpatient alcohol and  depression treatment  Time spent in minutes : 60      ELLIS,ALLISON L. ANP on 03/26/2015 at 3:00 PM  Between 7am to 7pm - Pager - [phone]  After 7pm go to [email or web address] - password [email or web address]  And look for the  night coverage person covering me after hours  [facility]

## 2015-03-26 NOTE — ED Notes (Signed)
Pt ambulatory to bathroom, reports increase in abdominal pain and 1 episode of vomiting, emesis noted.

## 2015-03-26 NOTE — ED Notes (Signed)
Patient states threw up.  No pill found in vomitus.   Color was brown.

## 2015-03-27 DIAGNOSIS — F10220 Alcohol dependence with intoxication, uncomplicated: Secondary | ICD-10-CM

## 2015-03-27 DIAGNOSIS — R740 Nonspecific elevation of levels of transaminase and lactic acid dehydrogenase [LDH]: Secondary | ICD-10-CM

## 2015-03-27 DIAGNOSIS — E43 Unspecified severe protein-calorie malnutrition: Secondary | ICD-10-CM

## 2015-03-27 DIAGNOSIS — E440 Moderate protein-calorie malnutrition: Secondary | ICD-10-CM | POA: Insufficient documentation

## 2015-03-27 DIAGNOSIS — I10 Essential (primary) hypertension: Secondary | ICD-10-CM

## 2015-03-27 DIAGNOSIS — K852 Alcohol induced acute pancreatitis without necrosis or infection: Principal | ICD-10-CM

## 2015-03-27 LAB — COMPREHENSIVE METABOLIC PANEL
ALBUMIN: 3.3 g/dL — AB (ref 3.5–5.0)
ALK PHOS: 88 U/L (ref 38–126)
ALT: 66 U/L — ABNORMAL HIGH (ref 17–63)
ANION GAP: 8 (ref 5–15)
AST: 73 U/L — AB (ref 15–41)
BUN: 5 mg/dL — ABNORMAL LOW (ref 6–20)
CALCIUM: 8.3 mg/dL — AB (ref 8.9–10.3)
CO2: 26 mmol/L (ref 22–32)
Chloride: 104 mmol/L (ref 101–111)
Creatinine, Ser: 0.71 mg/dL (ref 0.61–1.24)
Glucose, Bld: 77 mg/dL (ref 65–99)
Potassium: 3.3 mmol/L — ABNORMAL LOW (ref 3.5–5.1)
SODIUM: 138 mmol/L (ref 135–145)
TOTAL PROTEIN: 6.2 g/dL — AB (ref 6.5–8.1)
Total Bilirubin: 1 mg/dL (ref 0.3–1.2)

## 2015-03-27 LAB — CBC
HCT: 33.8 % — ABNORMAL LOW (ref 39.0–52.0)
HEMOGLOBIN: 11.1 g/dL — AB (ref 13.0–17.0)
MCH: 31.5 pg (ref 26.0–34.0)
MCHC: 32.8 g/dL (ref 30.0–36.0)
MCV: 96 fL (ref 78.0–100.0)
Platelets: 159 10*3/uL (ref 150–400)
RBC: 3.52 MIL/uL — AB (ref 4.22–5.81)
RDW: 15.2 % (ref 11.5–15.5)
WBC: 9.5 10*3/uL (ref 4.0–10.5)

## 2015-03-27 MED ORDER — POTASSIUM CHLORIDE IN NACL 20-0.9 MEQ/L-% IV SOLN
INTRAVENOUS | Status: DC
  Administered 2015-03-27 – 2015-03-29 (×6): via INTRAVENOUS
  Administered 2015-03-30: 1 mL via INTRAVENOUS
  Filled 2015-03-27 (×8): qty 1000

## 2015-03-27 MED ORDER — GI COCKTAIL ~~LOC~~
30.0000 mL | Freq: Once | ORAL | Status: AC
  Administered 2015-03-27: 30 mL via ORAL
  Filled 2015-03-27: qty 30

## 2015-03-27 MED ORDER — HYDROMORPHONE HCL 1 MG/ML IJ SOLN
1.0000 mg | INTRAMUSCULAR | Status: DC | PRN
  Administered 2015-03-27 (×3): 2 mg via INTRAVENOUS
  Administered 2015-03-28: 1 mg via INTRAVENOUS
  Administered 2015-03-28 (×2): 2 mg via INTRAVENOUS
  Filled 2015-03-27: qty 2
  Filled 2015-03-27: qty 1
  Filled 2015-03-27 (×3): qty 2

## 2015-03-27 MED ORDER — POTASSIUM CHLORIDE 10 MEQ/100ML IV SOLN
10.0000 meq | INTRAVENOUS | Status: AC
  Administered 2015-03-27 (×4): 10 meq via INTRAVENOUS
  Filled 2015-03-27 (×5): qty 100

## 2015-03-27 MED ORDER — CETYLPYRIDINIUM CHLORIDE 0.05 % MT LIQD
7.0000 mL | Freq: Two times a day (BID) | OROMUCOSAL | Status: DC
  Administered 2015-03-27 – 2015-03-29 (×4): 7 mL via OROMUCOSAL

## 2015-03-27 NOTE — Progress Notes (Signed)
Initial Nutrition Assessment  DOCUMENTATION CODES:   Non-severe (moderate) malnutrition in context of social or environmental circumstances  INTERVENTION:   -RD will follow for diet advancement and supplement diet as appropriate  NUTRITION DIAGNOSIS:   Inadequate oral intake related to inability to eat as evidenced by NPO status.  GOAL:   Patient will meet greater than or equal to 90% of their needs  MONITOR:   Diet advancement, Labs, Weight trends, Skin, I & O's  REASON FOR ASSESSMENT:   Consult Assessment of nutrition requirement/status  ASSESSMENT:   [name] is a 44 y.o. male with a Past Medical History of ETOH abuse and recurrent pancreatitis, who presents with acute pancreatitis in the setting of ongoing ETOH abuse. Will admit to [place], IVF, supportive treatment and CIWA protocol for alcohol withdrawal. He had an [place] last month with a possible small pseudocyst, and will repeat CT abdomen and pelvis to re-evaluate. SW consult for ETOH abuse, patient interested in quitting. He is non toxic appearing, not very hemoconcentrated and appears stable. Extensively counseled regarding ETOH and tobacco abuse.  Pt admitted with alcohol-induced pancreatitis.   Spoke with RN, prior to visit, who reveals pt is very lethargic. She reports she recently administered Ativan to the pt.   Pt did not arouse during exam or when name was called. No family members present to provide additional history.   Wt hx reviewed. UBW around 140#. Noted weight fluctuations, however, none are significant for time frame.   Nutrition-Focused physical exam completed. Findings are mild to moderate fat depletion, mild to moderate muscle depletion, and no edema.   Pt is currently NPO. Suspect poor oral intake PTA, due to hx of polysubstance abuse. Tox screen pending. Case discussed with RN.   Labs reviewed: K: 3.3 (on IV supplementation). Lipase: 212. Pt receiving MVI, folic acid, vitamin B-1.    Diet Order:  Diet NPO time specified  Skin:  Reviewed, no issues  Last BM:  03/26/15  Height:   Ht Readings from Last 1 Encounters:  03/26/15 5\' 9"  (1.753 m)    Weight:   Wt Readings from Last 1 Encounters:  03/26/15 138 lb (62.596 kg)    Ideal Body Weight:  72.7 kg  BMI:  Body mass index is 20.37 kg/(m^2).  Estimated Nutritional Needs:   Kcal:  1900-2100  Protein:  85-100 grams  Fluid:  1.9-2.1 L  EDUCATION NEEDS:   No education needs identified at this time  [name], RD, LDN, CDE Pager: [phone] After hours Pager: [phone]

## 2015-03-27 NOTE — Care Management Note (Signed)
Case Management Note  Patient Details  Name: [name] MRN: [id number] Date of Birth: 22-Sep-1970  Subjective/Objective:                    Action/Plan:  Initial UR completed  Expected Discharge Date:                  Expected Discharge Plan:  Home/Self Care  In-House Referral:     Discharge planning Services     Post Acute Care Choice:    Choice offered to:     DME Arranged:    DME Agency:     HH Arranged:    [place] Agency:     Status of Service:  In process, will continue to follow  Medicare Important Message Given:    Date Medicare IM Given:    Medicare IM give by:    Date Additional Medicare IM Given:    Additional Medicare Important Message give by:     If discussed at [place] of Stay Meetings, dates discussed:    Additional Comments:  Marilu Favre, RN 03/27/2015, 7:52 AM

## 2015-03-27 NOTE — Progress Notes (Signed)
[facility] PROGRESS NOTE  [name] [id number] DOB: [birth year] DOA: 03/26/2015  PCP: [name], NP  Brief HPI: 44 year old [name] with a past medical history of alcohol abuse as well as polysubstance abuse with previous history of pancreatitis and pseudocysts presented with abdominal pain, nausea and vomiting. He was found to have elevated lipase. CT scan suggested acute pancreatitis. He was hospitalized for further management.  Past medical history:  Past Medical History  Diagnosis Date  . Hypertension   . Asthma   . Pancreatitis   . Cocaine abuse   . Depression   . H/O suicide attempt 10/2012  . Heart murmur     "when he was little" (03/06/2013)  . Anemia   . H/O hiatal hernia   . GERD (gastroesophageal reflux disease)   . Anxiety   . WPW (Wolff-Parkinson-White syndrome)     [name] 03/06/2013  . High cholesterol   . Femoral condyle fracture ([place]) 03/08/2014    left medial/notes 03/09/2014  . Alcoholism /alcohol abuse ([place])   . Family history of adverse reaction to anesthesia     "grandmother gets confused"  . Shortness of breath     "can happen at anytime" (03/06/2013)  . Pneumonia 1990's X 3  . Chronic bronchitis ([place])   . Sickle cell trait ([place])   . History of blood transfusion 10/2012    "when I tried to commit suicide"  . History of stomach ulcers   . Migraine     "a few times/year" (03/26/2015)  . Arthritis     "knees; arms; elbows" (03/26/2015)  . Chronic lower back pain   . Bipolar disorder ([place])   . PTSD (post-traumatic stress disorder)     Consultants: None   Procedures: None  Antibiotics: None  Subjective: Patient continues to have 8 out of 10 pain in the upper abdomen. Radiating to the back. Continues to be nauseated. Denies any vomiting this morning.  Objective: Vital Signs  Filed Vitals:   03/26/15 2156 03/26/15 2225 03/27/15 0000 03/27/15 0518  BP: 148/102 143/101  136/97  Pulse: 96 88 91 95  Temp:   97.3 F (36.3 C)  98.1 F (36.7 C)  TempSrc:  Oral    Resp:  16  17  Height:      Weight:      SpO2:  99%  94%    Intake/Output Summary (Last 24 hours) at 03/27/15 [id number] Last data filed at 03/27/15 0500  Gross per 24 hour  Intake   3126 ml  Output    100 ml  Net   3026 ml   Filed Weights   03/26/15 1036  Weight: 62.596 kg (138 lb)    General appearance: alert, cooperative, appears stated age and no distress Resp: clear to auscultation bilaterally Cardio: regular rate and rhythm, S1, S2 normal, no murmur, click, rub or gallop GI: Abdomen is soft. Tender in the epigastric area without any rebound, rigidity or guarding. No masses or organomegaly. Bowel sounds are present. Extremities: extremities normal, atraumatic, no cyanosis or edema Pulses: 2+ and symmetric Neurologic: No focal deficits  Lab Results:  Basic Metabolic Panel:  Recent Labs Lab 03/22/15 0910 03/26/15 1100 03/27/15 0500  NA 136 136 138  K 5.6* 4.3 3.3*  CL 102 101 104  CO2 24 23 26   GLUCOSE 79 98 77  BUN 6 10 5*  CREATININE 0.68 0.99 0.71  CALCIUM 9.1 9.0 8.3*   Liver Function Tests:  Recent Labs Lab 03/22/15 0910 03/26/15 1100  03/27/15 0500  AST 159* 51* 73*  ALT 90* 70* 66*  ALKPHOS 116 105 88  BILITOT 2.0* 0.8 1.0  PROT 8.4* 7.6 6.2*  ALBUMIN 4.1 3.9 3.3*    Recent Labs Lab 03/22/15 0910 03/26/15 1100  LIPASE 35 212*   CBC:  Recent Labs Lab 03/22/15 0910 03/26/15 1100 03/27/15 0500  WBC 6.4 7.7 9.5  NEUTROABS 3.2 3.4  --   HGB 14.0 13.3 11.1*  HCT 41.6 39.8 33.8*  MCV 95.0 95.4 96.0  PLT 253 217 159     Studies/Results: Ct Abdomen Pelvis W Contrast  03/26/2015  CLINICAL DATA:  Upper abdominal pain with nausea and vomiting EXAM: CT ABDOMEN AND PELVIS WITH CONTRAST TECHNIQUE: Multidetector CT imaging of the abdomen and pelvis was performed using the standard protocol following bolus administration of intravenous contrast. CONTRAST:  173mL OMNIPAQUE IOHEXOL 300 MG/ML   SOLN COMPARISON:  02/08/2015, 12/17/2014 FINDINGS: Lung bases are well aerated. Very minimal dependent atelectatic changes are noted on the right. The liver, gallbladder, spleen, adrenal glands and kidneys are within normal limits. No calculi are seen. Delayed images demonstrate normal excretion of contrast. The pancreas again demonstrates significant peripancreatic inflammatory changes consistent with pancreatitis. There are cystic areas in the region of the tail of the pancreas and midbody similar to that seen on previous MRI examination consistent with small pseudocyst. They are stable in appearance. No findings to suggest pancreatic necrosis are noted. The phlegmon extends inferiorly along the posterior aspect of the stomach and along the anterior aspect of the left kidney. No significant free pelvic fluid is noted. The bladder is well distended. The appendix is not well seen although no inflammatory changes are identified. Diffuse aortoiliac calcifications are noted. The osseous structures are within normal limits. IMPRESSION: When compared with the prior MRI examination from August of 2016 there again noted cystic areas in the body and tail consistent with small pseudocysts. There stable in appearance. There is diffuse inflammatory change surrounding the pancreas but mainly in the region of the body and tail consistent with acute pancreatitis. Delayed images demonstrate normal enhancement of the pancreas with the exception of the cystic areas previously described. Electronically Signed   By: Inez Catalina M.D.   On: 03/26/2015 20:33    Medications:  Scheduled: . antiseptic oral rinse  7 mL Mouth Rinse BID  . enoxaparin (LOVENOX) injection  40 mg Subcutaneous Q24H  . folic acid  1 mg Oral Daily  . LORazepam  0-4 mg Intravenous Q6H   Followed by  . [START ON 03/28/2015] LORazepam  0-4 mg Intravenous Q12H  . metoprolol tartrate  25 mg Oral BID  . multivitamin with minerals  1 tablet Oral Daily  .  ondansetron (ZOFRAN) IV  4 mg Intravenous 4 times per day  . pantoprazole  40 mg Oral Daily  . potassium chloride  10 mEq Intravenous Q1 Hr x 4  . QUEtiapine  100 mg Oral QHS  . sertraline  75 mg Oral Daily  . thiamine  100 mg Oral Daily   Or  . thiamine  100 mg Intravenous Daily   Continuous: . 0.9 % NaCl with KCl 20 mEq / L 100 mL/hr at 03/27/15 1040   [id number] **OR** acetaminophen, albuterol, HYDROmorphone (DILAUDID) injection, LORazepam **OR** LORazepam, promethazine  Assessment/Plan:  Principal Problem:   Alcohol-induced acute pancreatitis Active Problems:   TOBACCO ABUSE   Protein-calorie malnutrition, severe (HCC)   MDD (major depressive disorder), recurrent severe, without psychosis ([place])   Alcohol dependence ([place])  Essential hypertension   Abnormal transaminases   Polysubstance abuse (tobacco, cocaine, THC, and ETOH)   Acute pancreatitis    Acute alcoholic pancreatitis Patient remained stable. Continues to have significant pain. Continue current management. Leave nothing by mouth for now. Antiemetics as needed. CT scan shows stable pseudocysts. There is evidence for acute inflammation of the pancreas. Patient was strongly counseled regarding his alcohol intake.  Alcohol dependence/polysubstance abuse History of using beer as well as recent ingestion of moonshine. Prior urine drug screen positive for cocaine and marijuana; current drug screen positive for cocaine. Continue CIWA protocol. Continue thiamine.  Abnormal transaminases Secondary to alcohol use and likely evolving dehydration. Continue to monitor periodically.  MDD (major depressive disorder), recurrent severe, without psychosis Patient reports this as precipitating factor for ongoing substance abuse. Continue preadmission Zoloft and Seroquel  Essential hypertension Continue preadmission Lopressor  Protein-calorie malnutrition, severe ([place]) Secondary to ongoing alcohol abuse and recurrent  chronic pancreatitis. Nutrition consultation. TSH is normal. Anemia panel reviewed. B-12 level noted to be on the lower side. Folic acid level is normal. May benefit from B-12 supplementation.  TOBACCO ABUSE Counseled regarding cessation  Hypokalemia Will be repleted   DVT Prophylaxis: Lovenox    Code Status: Full code  Family Communication: Discussed with the patient  Disposition Plan: Continue current management.    LOS: 1 day   [facility] Pager [phone] 03/27/2015, 9:28 AM  If 7PM-7AM, please contact night-coverage at [email or web address], password [facility]

## 2015-03-28 DIAGNOSIS — F1910 Other psychoactive substance abuse, uncomplicated: Secondary | ICD-10-CM

## 2015-03-28 LAB — CBC
HEMATOCRIT: 32.7 % — AB (ref 39.0–52.0)
HEMOGLOBIN: 10.7 g/dL — AB (ref 13.0–17.0)
MCH: 31.6 pg (ref 26.0–34.0)
MCHC: 32.7 g/dL (ref 30.0–36.0)
MCV: 96.5 fL (ref 78.0–100.0)
PLATELETS: 187 10*3/uL (ref 150–400)
RBC: 3.39 MIL/uL — AB (ref 4.22–5.81)
RDW: 15.1 % (ref 11.5–15.5)
WBC: 10.1 10*3/uL (ref 4.0–10.5)

## 2015-03-28 LAB — COMPREHENSIVE METABOLIC PANEL
ALBUMIN: 3.2 g/dL — AB (ref 3.5–5.0)
ALK PHOS: 95 U/L (ref 38–126)
ALT: 44 U/L (ref 17–63)
ANION GAP: 10 (ref 5–15)
AST: 36 U/L (ref 15–41)
BUN: <5 mg/dL — ABNORMAL LOW (ref 6–20)
CHLORIDE: 106 mmol/L (ref 101–111)
CO2: 22 mmol/L (ref 22–32)
Calcium: 8.7 mg/dL — ABNORMAL LOW (ref 8.9–10.3)
Creatinine, Ser: 0.55 mg/dL — ABNORMAL LOW (ref 0.61–1.24)
GFR calc non Af Amer: >60 mL/min (ref >60)
GLUCOSE: 72 mg/dL (ref 65–99)
POTASSIUM: 3.9 mmol/L (ref 3.5–5.1)
SODIUM: 138 mmol/L (ref 135–145)
Total Bilirubin: 1.2 mg/dL (ref 0.3–1.2)
Total Protein: 6.5 g/dL (ref 6.5–8.1)

## 2015-03-28 LAB — LIPASE, BLOOD: Lipase: 142 U/L — ABNORMAL HIGH (ref 11–51)

## 2015-03-28 MED ORDER — HYDROMORPHONE HCL 1 MG/ML IJ SOLN
1.0000 mg | INTRAMUSCULAR | Status: DC | PRN
  Administered 2015-03-28 – 2015-03-30 (×11): 1 mg via INTRAVENOUS
  Filled 2015-03-28 (×11): qty 1

## 2015-03-28 MED ORDER — OXYCODONE HCL 5 MG PO TABS
5.0000 mg | ORAL_TABLET | ORAL | Status: DC | PRN
  Administered 2015-03-28 – 2015-03-30 (×10): 5 mg via ORAL
  Filled 2015-03-28 (×11): qty 1

## 2015-03-28 MED ORDER — VITAMIN B-12 1000 MCG PO TABS
500.0000 ug | ORAL_TABLET | Freq: Every day | ORAL | Status: DC
  Administered 2015-03-29: 500 ug via ORAL
  Filled 2015-03-28: qty 1

## 2015-03-28 NOTE — Progress Notes (Signed)
[facility] PROGRESS NOTE  [name] [id number] DOB: 1971-04-16 DOA: 03/26/2015  PCP: [name], NP  Brief HPI: 44 year old [name] with a past medical history of alcohol abuse as well as polysubstance abuse with previous history of pancreatitis and pseudocysts presented with abdominal pain, nausea and vomiting. He was found to have elevated lipase. CT scan suggested acute pancreatitis. He was hospitalized for further management.  Past medical history:  Past Medical History  Diagnosis Date  . Hypertension   . Asthma   . Pancreatitis   . Cocaine abuse   . Depression   . H/O suicide attempt 10/2012  . Heart murmur     "when he was little" (03/06/2013)  . Anemia   . H/O hiatal hernia   . GERD (gastroesophageal reflux disease)   . Anxiety   . WPW (Wolff-Parkinson-White syndrome)     [name] 03/06/2013  . High cholesterol   . Femoral condyle fracture ([place]) 03/08/2014    left medial/notes 03/09/2014  . Alcoholism /alcohol abuse ([place])   . Family history of adverse reaction to anesthesia     "grandmother gets confused"  . Shortness of breath     "can happen at anytime" (03/06/2013)  . Pneumonia 1990's X 3  . Chronic bronchitis ([place])   . Sickle cell trait ([place])   . History of blood transfusion 10/2012    "when I tried to commit suicide"  . History of stomach ulcers   . Migraine     "a few times/year" (03/26/2015)  . Arthritis     "knees; arms; elbows" (03/26/2015)  . Chronic lower back pain   . Bipolar disorder ([place])   . PTSD (post-traumatic stress disorder)     Consultants: None   Procedures: None  Antibiotics: None  Subjective: Patient continues to have pain in the upper abdomen. He rates it at a 6 or 10 in intensity. It radiates to the back. Continues to be nauseated.   Objective: Vital Signs  Filed Vitals:   03/27/15 1129 03/27/15 1440 03/27/15 2102 03/28/15 0500  BP: 136/97 135/88 165/91 159/90  Pulse: 95 88 105 113  Temp:   98 F (36.7 C) 98.5 F (36.9 C) 98.4 F (36.9 C)  TempSrc:  Oral Oral Oral  Resp:  18 18 18   Height:      Weight:      SpO2:  99% 96% 95%    Intake/Output Summary (Last 24 hours) at 03/28/15 0947 Last data filed at 03/28/15 [id number]  Gross per 24 hour  Intake 2229.67 ml  Output    675 ml  Net 1554.67 ml   Filed Weights   03/26/15 1036  Weight: 62.596 kg (138 lb)    General appearance: alert, cooperative, appears stated age and no distress Resp: clear to auscultation bilaterally Cardio: regular rate and rhythm, S1, S2 normal, no murmur, click, rub or gallop GI: Abdomen is soft. He is tender in the epigastric area, although it appears to be mild. There is no rebound, rigidity or guarding. Bowel sounds are present. No masses or organomegaly.  Extremities: extremities normal, atraumatic, no cyanosis or edema Neurologic: No focal deficits  Lab Results:  Basic Metabolic Panel:  Recent Labs Lab 03/22/15 0910 03/26/15 1100 03/27/15 0500 03/28/15 0334  NA 136 136 138 138  K 5.6* 4.3 3.3* 3.9  CL 102 101 104 106  CO2 24 23 26 22   GLUCOSE 79 98 77 72  BUN 6 10 5* <5*  CREATININE 0.68 0.99 0.71 0.55*  CALCIUM 9.1 9.0 8.3* 8.7*   Liver Function Tests:  Recent Labs Lab 03/22/15 0910 03/26/15 1100 03/27/15 0500 03/28/15 0334  AST 159* 51* 73* 36  ALT 90* 70* 66* 44  ALKPHOS 116 105 88 95  BILITOT 2.0* 0.8 1.0 1.2  PROT 8.4* 7.6 6.2* 6.5  ALBUMIN 4.1 3.9 3.3* 3.2*    Recent Labs Lab 03/22/15 0910 03/26/15 1100 03/28/15 0334  LIPASE 35 212* 142*   CBC:  Recent Labs Lab 03/22/15 0910 03/26/15 1100 03/27/15 0500 03/28/15 0334  WBC 6.4 7.7 9.5 10.1  NEUTROABS 3.2 3.4  --   --   HGB 14.0 13.3 11.1* 10.7*  HCT 41.6 39.8 33.8* 32.7*  MCV 95.0 95.4 96.0 96.5  PLT 253 217 159 187     Studies/Results: Ct Abdomen Pelvis W Contrast  03/26/2015  CLINICAL DATA:  Upper abdominal pain with nausea and vomiting EXAM: CT ABDOMEN AND PELVIS WITH CONTRAST  TECHNIQUE: Multidetector CT imaging of the abdomen and pelvis was performed using the standard protocol following bolus administration of intravenous contrast. CONTRAST:  145mL OMNIPAQUE IOHEXOL 300 MG/ML  SOLN COMPARISON:  02/08/2015, 12/17/2014 FINDINGS: Lung bases are well aerated. Very minimal dependent atelectatic changes are noted on the right. The liver, gallbladder, spleen, adrenal glands and kidneys are within normal limits. No calculi are seen. Delayed images demonstrate normal excretion of contrast. The pancreas again demonstrates significant peripancreatic inflammatory changes consistent with pancreatitis. There are cystic areas in the region of the tail of the pancreas and midbody similar to that seen on previous MRI examination consistent with small pseudocyst. They are stable in appearance. No findings to suggest pancreatic necrosis are noted. The phlegmon extends inferiorly along the posterior aspect of the stomach and along the anterior aspect of the left kidney. No significant free pelvic fluid is noted. The bladder is well distended. The appendix is not well seen although no inflammatory changes are identified. Diffuse aortoiliac calcifications are noted. The osseous structures are within normal limits. IMPRESSION: When compared with the prior MRI examination from August of 2016 there again noted cystic areas in the body and tail consistent with small pseudocysts. There stable in appearance. There is diffuse inflammatory change surrounding the pancreas but mainly in the region of the body and tail consistent with acute pancreatitis. Delayed images demonstrate normal enhancement of the pancreas with the exception of the cystic areas previously described. Electronically Signed   By: Inez Catalina M.D.   On: 03/26/2015 20:33    Medications:  Scheduled: . antiseptic oral rinse  7 mL Mouth Rinse BID  . enoxaparin (LOVENOX) injection  40 mg Subcutaneous Q24H  . folic acid  1 mg Oral Daily  .  LORazepam  0-4 mg Intravenous Q6H   Followed by  . LORazepam  0-4 mg Intravenous Q12H  . metoprolol tartrate  25 mg Oral BID  . multivitamin with minerals  1 tablet Oral Daily  . ondansetron (ZOFRAN) IV  4 mg Intravenous 4 times per day  . pantoprazole  40 mg Oral Daily  . QUEtiapine  100 mg Oral QHS  . sertraline  75 mg Oral Daily  . thiamine  100 mg Oral Daily   Or  . thiamine  100 mg Intravenous Daily   Continuous: . 0.9 % NaCl with KCl 20 mEq / L 100 mL/hr at 03/28/15 0301   [id number] **OR** acetaminophen, albuterol, HYDROmorphone (DILAUDID) injection, LORazepam **OR** LORazepam, oxyCODONE, promethazine  Assessment/Plan:  Principal Problem:   Alcohol-induced acute pancreatitis Active Problems:  TOBACCO ABUSE   Protein-calorie malnutrition, severe (HCC)   MDD (major depressive disorder), recurrent severe, without psychosis ([place])   Alcohol dependence ([place])   Essential hypertension   Abnormal transaminases   Polysubstance abuse (tobacco, cocaine, THC, and ETOH)   Acute pancreatitis   Malnutrition of moderate degree    Acute alcoholic pancreatitis Patient remains stable. Appears to be slowly improving. Place him on clear liquid diet. Slowly start weaning him off of IV narcotics. Previously patient has demonstrated drug-seeking behavior. Antiemetics as needed. CT scan shows stable pseudocysts. There was evidence for acute inflammation of the pancreas. Patient was strongly counseled regarding his alcohol intake.  Alcohol dependence/polysubstance abuse History of using beer as well as recent ingestion of moonshine. Prior urine drug screen positive for cocaine and marijuana; current drug screen positive for cocaine. Continue CIWA protocol. Continue thiamine. Minimally symptomatic.  Abnormal transaminases Secondary to alcohol use. LFTs are now normal.  MDD (major depressive disorder), recurrent severe, without psychosis Patient reports this as precipitating factor for  ongoing substance abuse. Continue preadmission Zoloft and Seroquel  Essential hypertension Continue preadmission Lopressor  Protein-calorie malnutrition, severe ([place]) Secondary to ongoing alcohol abuse and recurrent chronic pancreatitis. Nutrition consultation. TSH is normal. Anemia panel reviewed. B-12 level noted to be on the lower side. Folic acid level is normal. Initiate B-12 supplementation.  TOBACCO ABUSE Counseled regarding cessation  Hypokalemia Potassium is normal today.   DVT Prophylaxis: Lovenox    Code Status: Full code  Family Communication: Discussed with the patient  Disposition Plan: Continue current management. Advance diet. Anticipate discharge in 24-48 hours.    LOS: 2 days   [facility] Pager [phone] 03/28/2015, 9:47 AM  If 7PM-7AM, please contact night-coverage at [email or web address], password [name]

## 2015-03-29 DIAGNOSIS — F332 Major depressive disorder, recurrent severe without psychotic features: Secondary | ICD-10-CM

## 2015-03-29 LAB — CBC
HCT: 33.2 % — ABNORMAL LOW (ref 39.0–52.0)
Hemoglobin: 10.8 g/dL — ABNORMAL LOW (ref 13.0–17.0)
MCH: 31.2 pg (ref 26.0–34.0)
MCHC: 32.5 g/dL (ref 30.0–36.0)
MCV: 96 fL (ref 78.0–100.0)
PLATELETS: 121 10*3/uL — AB (ref 150–400)
RBC: 3.46 MIL/uL — ABNORMAL LOW (ref 4.22–5.81)
RDW: 14.8 % (ref 11.5–15.5)
WBC: 6.5 10*3/uL (ref 4.0–10.5)

## 2015-03-29 LAB — COMPREHENSIVE METABOLIC PANEL
ALT: 58 U/L (ref 17–63)
AST: 69 U/L — AB (ref 15–41)
Albumin: 3.1 g/dL — ABNORMAL LOW (ref 3.5–5.0)
Alkaline Phosphatase: 136 U/L — ABNORMAL HIGH (ref 38–126)
Anion gap: 10 (ref 5–15)
BUN: <5 mg/dL — ABNORMAL LOW (ref 6–20)
CHLORIDE: 106 mmol/L (ref 101–111)
CO2: 23 mmol/L (ref 22–32)
Calcium: 8.8 mg/dL — ABNORMAL LOW (ref 8.9–10.3)
Creatinine, Ser: 0.58 mg/dL — ABNORMAL LOW (ref 0.61–1.24)
Glucose, Bld: 95 mg/dL (ref 65–99)
POTASSIUM: 4.2 mmol/L (ref 3.5–5.1)
Sodium: 139 mmol/L (ref 135–145)
Total Bilirubin: 0.8 mg/dL (ref 0.3–1.2)
Total Protein: 6.3 g/dL — ABNORMAL LOW (ref 6.5–8.1)

## 2015-03-29 NOTE — Progress Notes (Signed)
[facility] PROGRESS NOTE  WARING WOOLARD [id number] DOB: 10/29/1970 DOA: 03/26/2015  PCP: [name], NP  Brief HPI: 44 year old [name] with a past medical history of alcohol abuse as well as polysubstance abuse with previous history of pancreatitis and pseudocysts presented with abdominal pain, nausea and vomiting. He was found to have elevated lipase. CT scan suggested acute pancreatitis. He was hospitalized for further management.  Past medical history:  Past Medical History  Diagnosis Date  . Hypertension   . Asthma   . Pancreatitis   . Cocaine abuse   . Depression   . H/O suicide attempt 10/2012  . Heart murmur     "when he was little" (03/06/2013)  . Anemia   . H/O hiatal hernia   . GERD (gastroesophageal reflux disease)   . Anxiety   . WPW (Wolff-Parkinson-White syndrome)     [name] 03/06/2013  . High cholesterol   . Femoral condyle fracture ([place]) 03/08/2014    left medial/notes 03/09/2014  . Alcoholism /alcohol abuse ([place])   . Family history of adverse reaction to anesthesia     "grandmother gets confused"  . Shortness of breath     "can happen at anytime" (03/06/2013)  . Pneumonia 1990's X 3  . Chronic bronchitis ([place])   . Sickle cell trait ([place])   . History of blood transfusion 10/2012    "when I tried to commit suicide"  . History of stomach ulcers   . Migraine     "a few times/year" (03/26/2015)  . Arthritis     "knees; arms; elbows" (03/26/2015)  . Chronic lower back pain   . Bipolar disorder ([place])   . PTSD (post-traumatic stress disorder)     Consultants: None   Procedures: None  Antibiotics: None  Subjective: Patient starting to feel better. Continues to have 4 out of 10 pain in his upper abdomen. Nauseated at times, although has tolerated his liquid diet.    Objective: Vital Signs  Filed Vitals:   03/27/15 2102 03/28/15 0500 03/28/15 1312 03/28/15 2000  BP: 165/91 159/90 137/88 134/83  Pulse: 105 113 88 99    Temp: 98.5 F (36.9 C) 98.4 F (36.9 C) 98.4 F (36.9 C) 98.9 F (37.2 C)  TempSrc: Oral Oral Oral Oral  Resp: 18 18 16 19   Height:      Weight:      SpO2: 96% 95% 96% 100%    Intake/Output Summary (Last 24 hours) at 03/29/15 1036 Last data filed at 03/29/15 0920  Gross per 24 hour  Intake 12046.17 ml  Output   1550 ml  Net 10496.17 ml   Filed Weights   03/26/15 1036  Weight: 62.596 kg (138 lb)    General appearance: alert, cooperative, appears stated age and no distress Resp: clear to auscultation bilaterally Cardio: regular rate and rhythm, S1, S2 normal, no murmur, click, rub or gallop GI: Abdomen is soft. He is mildly tender in the epigastric area. There is no rebound, rigidity or guarding. Bowel sounds are present. No masses or organomegaly.  Extremities: extremities normal, atraumatic, no cyanosis or edema Neurologic: No focal deficits  Lab Results:  Basic Metabolic Panel:  Recent Labs Lab 03/26/15 1100 03/27/15 0500 03/28/15 0334 03/29/15 0520  NA 136 138 138 139  K 4.3 3.3* 3.9 4.2  CL 101 104 106 106  CO2 23 26 22 23   GLUCOSE 98 77 72 95  BUN 10 5* <5* <5*  CREATININE 0.99 0.71 0.55* 0.58*  CALCIUM 9.0 8.3*  8.7* 8.8*   Liver Function Tests:  Recent Labs Lab 03/26/15 1100 03/27/15 0500 03/28/15 0334 03/29/15 0520  AST 51* 73* 36 69*  ALT 70* 66* 44 58  ALKPHOS 105 88 95 136*  BILITOT 0.8 1.0 1.2 0.8  PROT 7.6 6.2* 6.5 6.3*  ALBUMIN 3.9 3.3* 3.2* 3.1*    Recent Labs Lab 03/26/15 1100 03/28/15 0334  LIPASE 212* 142*   CBC:  Recent Labs Lab 03/26/15 1100 03/27/15 0500 03/28/15 0334 03/29/15 0520  WBC 7.7 9.5 10.1 6.5  NEUTROABS 3.4  --   --   --   HGB 13.3 11.1* 10.7* 10.8*  HCT 39.8 33.8* 32.7* 33.2*  MCV 95.4 96.0 96.5 96.0  PLT 217 159 187 121*     Studies/Results: No results found.  Medications:  Scheduled: . antiseptic oral rinse  7 mL Mouth Rinse BID  . enoxaparin (LOVENOX) injection  40 mg Subcutaneous Q24H   . folic acid  1 mg Oral Daily  . LORazepam  0-4 mg Intravenous Q12H  . metoprolol tartrate  25 mg Oral BID  . multivitamin with minerals  1 tablet Oral Daily  . ondansetron (ZOFRAN) IV  4 mg Intravenous 4 times per day  . pantoprazole  40 mg Oral Daily  . QUEtiapine  100 mg Oral QHS  . sertraline  75 mg Oral Daily  . thiamine  100 mg Oral Daily   Or  . thiamine  100 mg Intravenous Daily  . vitamin B-12  500 mcg Oral Daily   Continuous: . 0.9 % NaCl with KCl 20 mEq / L 100 mL/hr at 03/29/15 0700   [id number] **OR** acetaminophen, albuterol, HYDROmorphone (DILAUDID) injection, LORazepam **OR** LORazepam, oxyCODONE, promethazine  Assessment/Plan:  Principal Problem:   Alcohol-induced acute pancreatitis Active Problems:   TOBACCO ABUSE   Protein-calorie malnutrition, severe (HCC)   MDD (major depressive disorder), recurrent severe, without psychosis ([place])   Alcohol dependence ([place])   Essential hypertension   Abnormal transaminases   Polysubstance abuse (tobacco, cocaine, THC, and ETOH)   Acute pancreatitis   Malnutrition of moderate degree    Acute alcoholic pancreatitis Patient is slowly improving. Advance to soft diet. Slowly start weaning him off of IV narcotics. Previously patient has demonstrated drug-seeking behavior. Antiemetics as needed. CT scan shows stable pseudocysts. There was evidence for acute inflammation of the pancreas. Patient was strongly counseled regarding his alcohol intake.   Alcohol dependence/polysubstance abuse History of using beer as well as recent ingestion of moonshine. Prior urine drug screen positive for cocaine and marijuana; current drug screen positive for cocaine. Continue CIWA protocol. Continue thiamine. Minimally symptomatic.  Abnormal transaminases Secondary to alcohol use. LFTs are now normal.  MDD (major depressive disorder), recurrent severe, without psychosis Patient reports this as precipitating factor for ongoing  substance abuse. Continue preadmission Zoloft and Seroquel  Essential hypertension Continue preadmission Lopressor  Protein-calorie malnutrition, severe ([place]) Secondary to ongoing alcohol abuse and recurrent chronic pancreatitis. Nutrition consultation. TSH is normal. Anemia panel reviewed. B-12 level noted to be on the lower side. Folic acid level is normal. Initiate B-12 supplementation.  TOBACCO ABUSE Counseled regarding cessation  Hypokalemia Repleted   DVT Prophylaxis: Lovenox    Code Status: Full code  Family Communication: Discussed with the patient  Disposition Plan: Continue current management. Advance diet. Anticipate discharge in 24 hours.    LOS: 3 days   [facility] Pager [phone] 03/29/2015, 10:36 AM  If 7PM-7AM, please contact night-coverage at [email or web address], password [facility]

## 2015-03-30 ENCOUNTER — Telehealth: Payer: Self-pay | Admitting: *Deleted

## 2015-03-30 MED ORDER — ONDANSETRON 4 MG PO TBDP: 4.0000 mg | ORAL_TABLET | Freq: Three times a day (TID) | ORAL | Status: DC | PRN

## 2015-03-30 MED ORDER — CYANOCOBALAMIN 500 MCG PO TABS: 500.0000 ug | ORAL_TABLET | Freq: Every day | ORAL | Status: DC

## 2015-03-30 MED ORDER — OXYCODONE-ACETAMINOPHEN 5-325 MG PO TABS: 2.0000 | ORAL_TABLET | ORAL | Status: DC | PRN

## 2015-03-30 NOTE — Progress Notes (Signed)
Patient discfharged all instructions given with PX as ordered

## 2015-03-30 NOTE — Care Management Note (Signed)
Case Management Note  Patient Details  Name: [name] MRN: [id number] Date of Birth: 1971/03/06  Subjective/Objective:                    Action/Plan:  [place] letter given and explained . Patient aware pain medications and over the counter medications are not covered. [place] card application given to patient also .  Expected Discharge Date:                  Expected Discharge Plan:  Home/Self Care  In-House Referral:     Discharge planning Services  CM Consult, [facility], [facility] Program, Medication Assistance  Post Acute Care Choice:    Choice offered to:     DME Arranged:    DME Agency:     HH Arranged:    HH Agency:     Status of Service:  Completed, signed off  Medicare Important Message Given:    Date Medicare IM Given:    Medicare IM give by:    Date Additional Medicare IM Given:    Additional Medicare Important Message give by:     If discussed at [place] of Stay Meetings, dates discussed:    Additional Comments:  Marilu Favre, RN 03/30/2015, 12:00 PM

## 2015-03-30 NOTE — Discharge Instructions (Signed)
Acute Pancreatitis Acute pancreatitis is a disease in which the pancreas becomes suddenly inflamed. The pancreas is a large gland located behind your stomach. The pancreas produces enzymes that help digest food. The pancreas also releases the hormones glucagon and insulin that help regulate blood sugar. Damage to the pancreas occurs when the digestive enzymes from the pancreas are activated and begin attacking the pancreas before being released into the intestine. Most acute attacks last a couple of days and can cause serious complications. Some people become dehydrated and develop low blood pressure. In severe cases, bleeding into the pancreas can lead to shock and can be life-threatening. The lungs, heart, and kidneys may fail. CAUSES  Pancreatitis can happen to anyone. In some cases, the cause is unknown. Most cases are caused by:  Alcohol abuse.  Gallstones. Other less common causes are:  Certain medicines.  Exposure to certain chemicals.  Infection.  Damage caused by an accident (trauma).  Abdominal surgery. SYMPTOMS   Pain in the upper abdomen that may radiate to the back.  Tenderness and swelling of the abdomen.  Nausea and vomiting. DIAGNOSIS  Your caregiver will perform a physical exam. Blood and stool tests may be done to confirm the diagnosis. Imaging tests may also be done, such as X-rays, CT scans, or an ultrasound of the abdomen. TREATMENT  Treatment usually requires a stay in the hospital. Treatment may include:  Pain medicine.  Fluid replacement through an intravenous line (IV).  Placing a tube in the stomach to remove stomach contents and control vomiting.  Not eating for 3 or 4 days. This gives your pancreas a rest, because enzymes are not being produced that can cause further damage.  Antibiotic medicines if your condition is caused by an infection.  Surgery of the pancreas or gallbladder. HOME CARE INSTRUCTIONS   Follow the diet advised by your  caregiver. This may involve avoiding alcohol and decreasing the amount of fat in your diet.  Eat smaller, more frequent meals. This reduces the amount of digestive juices the pancreas produces.  Drink enough fluids to keep your urine clear or pale yellow.  Only take over-the-counter or prescription medicines as directed by your caregiver.  Avoid drinking alcohol if it caused your condition.  Do not smoke.  Get plenty of rest.  Check your blood sugar at home as directed by your caregiver.  Keep all follow-up appointments as directed by your caregiver. SEEK MEDICAL CARE IF:   You do not recover as quickly as expected.  You develop new or worsening symptoms.  You have persistent pain, weakness, or nausea.  You recover and then have another episode of pain. SEEK IMMEDIATE MEDICAL CARE IF:   You are unable to eat or keep fluids down.  Your pain becomes severe.  You have a fever or persistent symptoms for more than 2 to 3 days.  You have a fever and your symptoms suddenly get worse.  Your skin or the white part of your eyes turn yellow (jaundice).  You develop vomiting.  You feel dizzy, or you faint.  Your blood sugar is high (over 300 mg/dL). MAKE SURE YOU:   Understand these instructions.  Will watch your condition.  Will get help right away if you are not doing well or get worse.   This information is not intended to replace advice given to you by your health care provider. Make sure you discuss any questions you have with your health care provider.   Document Released: 04/25/2005 Document Revised: 10/25/2011   Document Reviewed: 08/04/2011 Elsevier Interactive Patient Education 2016 Elsevier Inc.  

## 2015-03-30 NOTE — Discharge Summary (Signed)
[facility]  Physician Discharge Summary   Patient ID: [name] MRN: [id number] DOB/AGE: 1971/04/03 44 y.o.  Admit date: 03/26/2015 Discharge date: 03/30/2015  PCP: [name], NP  DISCHARGE DIAGNOSES:  Principal Problem:   Alcohol-induced acute pancreatitis Active Problems:   TOBACCO ABUSE   Protein-calorie malnutrition, severe (HCC)   MDD (major depressive disorder), recurrent severe, without psychosis ([place])   Alcohol dependence ([place])   Essential hypertension   Abnormal transaminases   Polysubstance abuse (tobacco, cocaine, THC, and ETOH)   Acute pancreatitis   Malnutrition of moderate degree   RECOMMENDATIONS FOR OUTPATIENT FOLLOW UP: 1. Patient asked to avoid alcohol intake   DISCHARGE CONDITION: fair  Diet recommendation: Low-fat  Filed Weights   03/26/15 1036  Weight: 62.596 kg (138 lb)    INITIAL HISTORY: 44 year old [name] with a past medical history of alcohol abuse as well as polysubstance abuse with previous history of pancreatitis and pseudocysts presented with abdominal pain, nausea and vomiting. He was found to have elevated lipase. CT scan suggested acute pancreatitis. He was hospitalized for further management.   HOSPITAL COURSE:   Acute alcoholic pancreatitis Patient was admitted to the hospital. He was initially kept nothing by mouth. He was given IV fluids. He was given pain medications. He slowly started improving. He was started on a clear liquid diet which was advanced to soft diet. There's been no nausea or vomiting. Patient was strongly counseled to stop drinking alcohol. Previously patient has demonstrated drug-seeking behavior. CT scan shows stable pseudocysts. There was evidence for acute inflammation of the pancreas.   Alcohol dependence/polysubstance abuse History of using beer as well as recent ingestion of moonshine. Prior urine drug screen positive for cocaine and marijuana; current drug screen  positive for cocaine. Patient was placed on CIWA protocol. He did not have significant symptoms of alcohol withdrawal.   Abnormal transaminases Secondary to alcohol use. LFTs are much improved.  MDD (major depressive disorder), recurrent severe, without psychosis Patient reports this as precipitating factor for ongoing substance abuse. Continue Zoloft and Seroquel  Essential hypertension Continue Lopressor  Protein-calorie malnutrition, severe (HCC) Secondary to ongoing alcohol abuse and recurrent chronic pancreatitis. Nutrition consultation was obtained. TSH is normal. Anemia panel reviewed. B-12 level noted to be on the lower side. Folic acid level is normal. Initiate B-12 supplementation.  TOBACCO ABUSE Counseled regarding cessation  Hypokalemia Repleted  Overall stable. He has been noted to be ambulating without any difficulty. He has tolerated his diet. Pain has improved. Okay for discharge today.   PERTINENT LABS:  The results of significant diagnostics from this hospitalization (including imaging, microbiology, ancillary and laboratory) are listed below for reference.     Labs: Basic Metabolic Panel:  Recent Labs Lab 03/26/15 1100 03/27/15 0500 03/28/15 0334 03/29/15 0520  NA 136 138 138 139  K 4.3 3.3* 3.9 4.2  CL 101 104 106 106  CO2 23 26 22 23   GLUCOSE 98 77 72 95  BUN 10 5* <5* <5*  CREATININE 0.99 0.71 0.55* 0.58*  CALCIUM 9.0 8.3* 8.7* 8.8*   Liver Function Tests:  Recent Labs Lab 03/26/15 1100 03/27/15 0500 03/28/15 0334 03/29/15 0520  AST 51* 73* 36 69*  ALT 70* 66* 44 58  ALKPHOS 105 88 95 136*  BILITOT 0.8 1.0 1.2 0.8  PROT 7.6 6.2* 6.5 6.3*  ALBUMIN 3.9 3.3* 3.2* 3.1*    Recent Labs Lab 03/26/15 1100 03/28/15 0334  LIPASE 212* 142*   CBC:  Recent Labs Lab 03/26/15 1100  03/27/15 0500 03/28/15 0334 03/29/15 0520  WBC 7.7 9.5 10.1 6.5  NEUTROABS 3.4  --   --   --   HGB 13.3 11.1* 10.7* 10.8*  HCT 39.8 33.8* 32.7* 33.2*    MCV 95.4 96.0 96.5 96.0  PLT 217 159 187 121*    IMAGING STUDIES  Ct Abdomen Pelvis W Contrast  03/26/2015  CLINICAL DATA:  Upper abdominal pain with nausea and vomiting EXAM: CT ABDOMEN AND PELVIS WITH CONTRAST TECHNIQUE: Multidetector CT imaging of the abdomen and pelvis was performed using the standard protocol following bolus administration of intravenous contrast. CONTRAST:  16mL OMNIPAQUE IOHEXOL 300 MG/ML  SOLN COMPARISON:  02/08/2015, 12/17/2014 FINDINGS: Lung bases are well aerated. Very minimal dependent atelectatic changes are noted on the right. The liver, gallbladder, spleen, adrenal glands and kidneys are within normal limits. No calculi are seen. Delayed images demonstrate normal excretion of contrast. The pancreas again demonstrates significant peripancreatic inflammatory changes consistent with pancreatitis. There are cystic areas in the region of the tail of the pancreas and midbody similar to that seen on previous MRI examination consistent with small pseudocyst. They are stable in appearance. No findings to suggest pancreatic necrosis are noted. The phlegmon extends inferiorly along the posterior aspect of the stomach and along the anterior aspect of the left kidney. No significant free pelvic fluid is noted. The bladder is well distended. The appendix is not well seen although no inflammatory changes are identified. Diffuse aortoiliac calcifications are noted. The osseous structures are within normal limits. IMPRESSION: When compared with the prior MRI examination from August of 2016 there again noted cystic areas in the body and tail consistent with small pseudocysts. There stable in appearance. There is diffuse inflammatory change surrounding the pancreas but mainly in the region of the body and tail consistent with acute pancreatitis. Delayed images demonstrate normal enhancement of the pancreas with the exception of the cystic areas previously described. Electronically Signed    By: Inez Catalina M.D.   On: 03/26/2015 20:33    DISCHARGE EXAMINATION: Filed Vitals:   03/28/15 2000 03/29/15 1315 03/29/15 2300 03/30/15 0646  BP: 134/83 142/84 139/95 111/70  Pulse: 99 87 84 80  Temp: 98.9 F (37.2 C) 98.4 F (36.9 C) 98.1 F (36.7 C) 97.8 F (36.6 C)  TempSrc: Oral Oral Oral Oral  Resp: 19 16 18 18   Height:      Weight:      SpO2: 100% 100% 100% 100%   General appearance: alert, cooperative, appears stated age and no distress Resp: clear to auscultation bilaterally Cardio: regular rate and rhythm, S1, S2 normal, no murmur, click, rub or gallop GI: soft, non-tender; bowel sounds normal; no masses,  no organomegaly  DISPOSITION: Home  Discharge Instructions    Call MD for:  difficulty breathing, headache or visual disturbances    Complete by:  As directed      Call MD for:  extreme fatigue    Complete by:  As directed      Call MD for:  persistant dizziness or light-headedness    Complete by:  As directed      Call MD for:  persistant nausea and vomiting    Complete by:  As directed      Call MD for:  severe uncontrolled pain    Complete by:  As directed      Call MD for:  temperature >100.4    Complete by:  As directed      Discharge diet:  Complete by:  As directed   Soft low fat     Discharge instructions    Complete by:  As directed   Please follow up with your PCP in 1 week. Please stay off of alcohol as it will aggravate your pancreas.  You were cared for by a hospitalist during your hospital stay. If you have any questions about your discharge medications or the care you received while you were in the hospital after you are discharged, you can call the unit and asked to speak with the hospitalist on call if the hospitalist that took care of you is not available. Once you are discharged, your primary care physician will handle any further medical issues. Please note that NO REFILLS for any discharge medications will be authorized once you are  discharged, as it is imperative that you return to your primary care physician (or establish a relationship with a primary care physician if you do not have one) for your aftercare needs so that they can reassess your need for medications and monitor your lab values. If you do not have a primary care physician, you can call [phone] for a physician referral.     Increase activity slowly    Complete by:  As directed            ALLERGIES:  Allergies  Allergen Reactions  . Shellfish-Derived Products Nausea And Vomiting  . Trazodone And Nefazodone Other (See Comments)    Muscle spasms     Discharge Medication List as of 03/30/2015 10:30 AM    START taking these medications   Details  vitamin B-12 500 MCG tablet Take 1 tablet (500 mcg total) by mouth daily., Starting 03/30/2015, Until Discontinued, Print      CONTINUE these medications which have CHANGED   Details  ondansetron (ZOFRAN ODT) 4 MG disintegrating tablet Take 1 tablet (4 mg total) by mouth every 8 (eight) hours as needed for nausea., Starting 03/30/2015, Until Discontinued, Print    oxyCODONE-acetaminophen (PERCOCET/ROXICET) 5-325 MG tablet Take 2 tablets by mouth every 4 (four) hours as needed for severe pain., Starting 03/30/2015, Until Discontinued, Print      CONTINUE these medications which have NOT CHANGED   Details  folic acid (FOLVITE) 1 MG tablet Take 1 tablet (1 mg total) by mouth daily., Starting 01/15/2015, Until Discontinued, Normal    lipase/protease/amylase (CREON) 12000 UNITS CPEP capsule Take 2 capsules (24,000 Units total) by mouth 3 (three) times daily with meals., Starting 01/15/2015, Until Discontinued, Normal    metoCLOPramide (REGLAN) 10 MG tablet Take 1 tablet (10 mg total) by mouth every 6 (six) hours., Starting 03/15/2015, Until Discontinued, Print    metoprolol tartrate (LOPRESSOR) 25 MG tablet Take 1 tablet (25 mg total) by mouth 2 (two) times daily., Starting 01/15/2015, Until Discontinued, Normal      Multiple Vitamin (MULTIVITAMIN) capsule Take 1 capsule by mouth daily., Starting 02/06/2014, Until Discontinued, Normal    omeprazole (PRILOSEC) 20 MG capsule Take 1 capsule (20 mg total) by mouth daily., Starting 02/08/2015, Until Discontinued, Print    QUEtiapine (SEROQUEL) 50 MG tablet Take 100 mg by mouth at bedtime., Starting 01/15/2015, Until Discontinued, Historical Med    sertraline (ZOLOFT) 25 MG tablet Take 3 tablets (75 mg total) by mouth daily., Starting 01/15/2015, Until Discontinued, Normal    sucralfate (CARAFATE) 1 G tablet Take 1 tablet (1 g total) by mouth 4 (four) times daily -  with meals and at bedtime., Starting 03/15/2015, Until Discontinued, Print    albuterol (  PROVENTIL HFA;VENTOLIN HFA) 108 (90 BASE) MCG/ACT inhaler Inhale 2 puffs into the lungs every 6 (six) hours as needed for wheezing or shortness of breath (wheezing). , Until Discontinued, Historical Med    Alum & Mag Hydroxide-Simeth (GI COCKTAIL) SUSP suspension Take 30 mLs by mouth 2 (two) times daily as needed for indigestion. Shake well., Starting 02/16/2015, Until Discontinued, Print    famotidine (PEPCID) 20 MG tablet Take 1 tablet (20 mg total) by mouth 2 (two) times daily., Starting 03/02/2015, Until Discontinued, Print    loperamide (IMODIUM) 2 MG capsule Take 1-2 capsules (2-4 mg total) by mouth as needed for diarrhea or loose stools., Starting 01/23/2014, Until Discontinued, No Print    sildenafil (VIAGRA) 50 MG tablet Take 1 tablet (50 mg total) by mouth daily as needed for erectile dysfunction., Starting 01/15/2015, Until Discontinued, Normal    traMADol (ULTRAM) 50 MG tablet Take 1 tablet (50 mg total) by mouth every 8 (eight) hours as needed., Starting 02/17/2015, Until Discontinued, Print      STOP taking these medications     ondansetron (ZOFRAN) 4 MG tablet      pantoprazole (PROTONIX) 20 MG tablet        Follow-up Information    Follow up with [facility], NP. Schedule an appointment as  soon as possible for a visit in 1 week.   Why:  post hospitalization follow up   Contact information:   [place] [address] [phone]       TOTAL DISCHARGE TIME: 35 minutes  [facility] Pager [phone]  03/30/2015, 1:44 PM

## 2015-03-30 NOTE — Telephone Encounter (Signed)
Patient would like a refill for his sildenafil (VIAGRA) 50 MG tablet.

## 2015-03-30 NOTE — Telephone Encounter (Signed)
Patient also mentioned he needs a refill on traMADol (ULTRAM) 50 MG tablet

## 2015-03-31 ENCOUNTER — Other Ambulatory Visit: Payer: Self-pay | Admitting: *Deleted

## 2015-03-31 DIAGNOSIS — N528 Other male erectile dysfunction: Secondary | ICD-10-CM

## 2015-03-31 MED ORDER — SILDENAFIL CITRATE 50 MG PO TABS: 50.0000 mg | ORAL_TABLET | Freq: Every day | ORAL | Status: DC | PRN

## 2015-03-31 NOTE — Telephone Encounter (Signed)
Patients Viagra was filled. Patient received pain medication on 111/21/16. Patient's Tramadol will not be refilled at this time.

## 2015-04-09 ENCOUNTER — Encounter (HOSPITAL_COMMUNITY): Payer: Self-pay | Admitting: Emergency Medicine

## 2015-04-09 ENCOUNTER — Emergency Department (HOSPITAL_COMMUNITY)
Admission: EM | Admit: 2015-04-09 | Discharge: 2015-04-09 | Disposition: A | Discharge status: Home / Self Care | Payer: Self-pay | Attending: Physician Assistant | Admitting: Physician Assistant

## 2015-04-09 DIAGNOSIS — F17210 Nicotine dependence, cigarettes, uncomplicated: Secondary | ICD-10-CM | POA: Insufficient documentation

## 2015-04-09 DIAGNOSIS — D649 Anemia, unspecified: Secondary | ICD-10-CM | POA: Insufficient documentation

## 2015-04-09 DIAGNOSIS — J45909 Unspecified asthma, uncomplicated: Secondary | ICD-10-CM | POA: Insufficient documentation

## 2015-04-09 DIAGNOSIS — R1110 Vomiting, unspecified: Secondary | ICD-10-CM | POA: Insufficient documentation

## 2015-04-09 DIAGNOSIS — F4310 Post-traumatic stress disorder, unspecified: Secondary | ICD-10-CM | POA: Insufficient documentation

## 2015-04-09 DIAGNOSIS — R109 Unspecified abdominal pain: Secondary | ICD-10-CM

## 2015-04-09 DIAGNOSIS — Z8701 Personal history of pneumonia (recurrent): Secondary | ICD-10-CM | POA: Insufficient documentation

## 2015-04-09 DIAGNOSIS — K219 Gastro-esophageal reflux disease without esophagitis: Secondary | ICD-10-CM | POA: Insufficient documentation

## 2015-04-09 DIAGNOSIS — R1084 Generalized abdominal pain: Secondary | ICD-10-CM | POA: Insufficient documentation

## 2015-04-09 DIAGNOSIS — Z79899 Other long term (current) drug therapy: Secondary | ICD-10-CM | POA: Insufficient documentation

## 2015-04-09 DIAGNOSIS — F419 Anxiety disorder, unspecified: Secondary | ICD-10-CM | POA: Insufficient documentation

## 2015-04-09 DIAGNOSIS — Z9889 Other specified postprocedural states: Secondary | ICD-10-CM | POA: Insufficient documentation

## 2015-04-09 DIAGNOSIS — G8929 Other chronic pain: Secondary | ICD-10-CM | POA: Insufficient documentation

## 2015-04-09 DIAGNOSIS — F319 Bipolar disorder, unspecified: Secondary | ICD-10-CM | POA: Insufficient documentation

## 2015-04-09 DIAGNOSIS — R011 Cardiac murmur, unspecified: Secondary | ICD-10-CM | POA: Insufficient documentation

## 2015-04-09 DIAGNOSIS — I10 Essential (primary) hypertension: Secondary | ICD-10-CM | POA: Insufficient documentation

## 2015-04-09 DIAGNOSIS — Z8781 Personal history of (healed) traumatic fracture: Secondary | ICD-10-CM | POA: Insufficient documentation

## 2015-04-09 DIAGNOSIS — M158 Other polyosteoarthritis: Secondary | ICD-10-CM | POA: Insufficient documentation

## 2015-04-09 DIAGNOSIS — Z8639 Personal history of other endocrine, nutritional and metabolic disease: Secondary | ICD-10-CM | POA: Insufficient documentation

## 2015-04-09 LAB — CBC
HCT: 39.9 % (ref 39.0–52.0)
Hemoglobin: 13.7 g/dL (ref 13.0–17.0)
MCH: 32.5 pg (ref 26.0–34.0)
MCHC: 34.3 g/dL (ref 30.0–36.0)
MCV: 94.8 fL (ref 78.0–100.0)
PLATELETS: 330 10*3/uL (ref 150–400)
RBC: 4.21 MIL/uL — AB (ref 4.22–5.81)
RDW: 14.5 % (ref 11.5–15.5)
WBC: 10.2 10*3/uL (ref 4.0–10.5)

## 2015-04-09 LAB — URINALYSIS, ROUTINE W REFLEX MICROSCOPIC
Bilirubin Urine: NEGATIVE
GLUCOSE, UA: NEGATIVE mg/dL
Hgb urine dipstick: NEGATIVE
KETONES UR: NEGATIVE mg/dL
LEUKOCYTES UA: NEGATIVE
NITRITE: NEGATIVE
PROTEIN: NEGATIVE mg/dL
Specific Gravity, Urine: 1.015 (ref 1.005–1.030)
pH: 6 (ref 5.0–8.0)

## 2015-04-09 LAB — COMPREHENSIVE METABOLIC PANEL
ALT: 51 U/L (ref 17–63)
ANION GAP: 10 (ref 5–15)
AST: 63 U/L — AB (ref 15–41)
Albumin: 4.6 g/dL (ref 3.5–5.0)
Alkaline Phosphatase: 139 U/L — ABNORMAL HIGH (ref 38–126)
BUN: 7 mg/dL (ref 6–20)
CHLORIDE: 102 mmol/L (ref 101–111)
CO2: 26 mmol/L (ref 22–32)
Calcium: 9.4 mg/dL (ref 8.9–10.3)
Creatinine, Ser: 0.74 mg/dL (ref 0.61–1.24)
Glucose, Bld: 96 mg/dL (ref 65–99)
POTASSIUM: 3.8 mmol/L (ref 3.5–5.1)
Sodium: 138 mmol/L (ref 135–145)
TOTAL PROTEIN: 8.9 g/dL — AB (ref 6.5–8.1)
Total Bilirubin: 0.4 mg/dL (ref 0.3–1.2)

## 2015-04-09 LAB — ETHANOL

## 2015-04-09 LAB — LIPASE, BLOOD: LIPASE: 180 U/L — AB (ref 11–51)

## 2015-04-09 MED ORDER — GI COCKTAIL ~~LOC~~
30.0000 mL | Freq: Once | ORAL | Status: AC
  Administered 2015-04-09: 30 mL via ORAL
  Filled 2015-04-09: qty 30

## 2015-04-09 MED ORDER — OXYCODONE-ACETAMINOPHEN 5-325 MG PO TABS
1.0000 | ORAL_TABLET | Freq: Once | ORAL | Status: AC
  Administered 2015-04-09: 1 via ORAL
  Filled 2015-04-09: qty 1

## 2015-04-09 MED ORDER — SODIUM CHLORIDE 0.9 % IV BOLUS (SEPSIS)
1000.0000 mL | Freq: Once | INTRAVENOUS | Status: AC
  Administered 2015-04-09: 1000 mL via INTRAVENOUS

## 2015-04-09 MED ORDER — ALUM & MAG HYDROXIDE-SIMETH 200-200-20 MG/5ML PO SUSP
15.0000 mL | Freq: Once | ORAL | Status: AC
  Administered 2015-04-09: 15 mL via ORAL
  Filled 2015-04-09: qty 30

## 2015-04-09 MED ORDER — HYDROMORPHONE HCL 1 MG/ML IJ SOLN
1.0000 mg | Freq: Once | INTRAMUSCULAR | Status: AC
  Administered 2015-04-09: 1 mg via INTRAVENOUS
  Filled 2015-04-09: qty 1

## 2015-04-09 MED ORDER — OXYCODONE-ACETAMINOPHEN 5-325 MG PO TABS: 1.0000 | ORAL_TABLET | Freq: Four times a day (QID) | ORAL | Status: DC | PRN

## 2015-04-09 MED ORDER — ALUM & MAG HYDROXIDE-SIMETH 400-400-40 MG/5ML PO SUSP: 5.0000 mL | Freq: Four times a day (QID) | ORAL | Status: DC | PRN

## 2015-04-09 NOTE — ED Notes (Addendum)
Per EMS. Pt from home. Recently admitted for pancreatitis. Today had sudden onset L side abd pain with n/v/d. Took home pain medication without relief. No n/v/d with ems. Pt denies drinking any etoh for the past few days.

## 2015-04-09 NOTE — ED Notes (Signed)
Bed: WA06 Expected date:  Expected time:  Means of arrival:  Comments: Ems-abd pain, pancreatitis

## 2015-04-09 NOTE — Progress Notes (Signed)
Pt was seen by [facility] staff stacy in [facility] ED Pt informs her he needs uninsured GI referral In order to get uninsured GI referral pt needs to get the [facility] orange cared (OC) which he has not been compliant with getting as verified in [facility] data base. Pt can get assist with OC at [facility] and/or [facility] Pt seen at [facility] in September 2016 Stacy reminded pt he needs to return to preferrably the [facility] to get OC assistance.  Stacy informed Cm pt requests medication assistance   Pt with recent [facility] admission for 4-5 days for similar dx on today Pt d/c from [facility] on 03/30/15 with [place] letter & OC application provided [facility] CM  Pt has not follow up on OC application Per EPIC notes/telephone call entries, pt has contacted [place] for Viagra and Ultram assistance [facility] generally sends pt to [facility] department to enroll in MAPP to get medication assistance from the pt assistance program related to the medication  CM spoke with pt about compliance with getting the OC to get referral to uninsured GI provider unless he is able to pay the general rate for GI MD out of pocket Then at that time he can speak with a GI MD billing office staff member on payment arrangements Cm discussed with pt he is not eligible for [facility] letter assistance since he received assistance on 01/28/15 [facility] d/c  CM reviewed pt medication list to offer the lowest goodrx rates and discount coupons plus encouraged pt to return to [facility] or [facility] pharmacy for discount medication assistance

## 2015-04-09 NOTE — ED Notes (Signed)
Gave pt sandwich, crackers and ginger ale per request.

## 2015-04-09 NOTE — ED Provider Notes (Signed)
CSN: [id number]     Arrival date & time 04/09/15  1016 History   First MD Initiated Contact with Patient 04/09/15 1029     Chief Complaint  Patient presents with  . Abdominal Pain     (Consider location/radiation/quality/duration/timing/severity/associated sxs/prior Treatment) HPI  Patient is a 44 year old male who has history of alcohol abuse, pancreatitis. Patient comes to the emergency department frequently for pain with pancreatitis. Patient most recently admitted roughly 2 weeks ago and discharged 1 week ago. Patient reports he was drinking after discharge. He stopped drinking 2 days ago. He reports his pain in his left upper quadrant worse. He is only vomited one time.   Past Medical History  Diagnosis Date  . Hypertension   . Asthma   . Pancreatitis   . Cocaine abuse   . Depression   . H/O suicide attempt 10/2012  . Heart murmur     "when he was little" (03/06/2013)  . Anemia   . H/O hiatal hernia   . GERD (gastroesophageal reflux disease)   . Anxiety   . WPW (Wolff-Parkinson-White syndrome)     [name] 03/06/2013  . High cholesterol   . Femoral condyle fracture ([place]) 03/08/2014    left medial/notes 03/09/2014  . Alcoholism /alcohol abuse ([place])   . Family history of adverse reaction to anesthesia     "grandmother gets confused"  . Shortness of breath     "can happen at anytime" (03/06/2013)  . Pneumonia 1990's X 3  . Chronic bronchitis ([place])   . Sickle cell trait ([place])   . History of blood transfusion 10/2012    "when I tried to commit suicide"  . History of stomach ulcers   . Migraine     "a few times/year" (03/26/2015)  . Arthritis     "knees; arms; elbows" (03/26/2015)  . Chronic lower back pain   . Bipolar disorder ([place])   . PTSD (post-traumatic stress disorder)    Past Surgical History  Procedure Laterality Date  . Facial fracture surgery Left 1990's    "result of trauma"   . Eye surgery Left 1990's    "result of trauma"   . Left heart  catheterization with coronary angiogram Right 03/07/2013    Procedure: LEFT HEART CATHETERIZATION WITH CORONARY ANGIOGRAM;  Surgeon: [name], MD;  Location: [place] [facility];  Service: Cardiovascular;  Laterality: Right;  . Cardiac catheterization    . Fracture surgery    . Umbilical hernia repair    . Hernia repair     Family History  Problem Relation Age of Onset  . Hypertension Other   . Coronary artery disease Other    Social History  Substance Use Topics  . Smoking status: Current Every Day Smoker -- 1.00 packs/day for 30 years    Types: Cigarettes  . Smokeless tobacco: Current User    Types: Chew  . Alcohol Use: 37.8 oz/week    63 Cans of beer per week     Comment: 03/26/2015 "6-12 beers/day"    Review of Systems  Constitutional: Negative for activity change.  Respiratory: Negative for shortness of breath.   Cardiovascular: Negative for chest pain.  Gastrointestinal: Positive for vomiting and abdominal pain.  Genitourinary: Negative for dysuria.  Allergic/Immunologic: Negative for immunocompromised state.  Psychiatric/Behavioral: Negative for agitation.      Allergies  Shellfish-derived products and Trazodone and nefazodone  Home Medications   Prior to Admission medications   Medication Sig Start Date End Date Taking? Authorizing Provider  albuterol (PROVENTIL  HFA;VENTOLIN HFA) 108 (90 BASE) MCG/ACT inhaler Inhale 2 puffs into the lungs every 6 (six) hours as needed for wheezing or shortness of breath (wheezing).     Historical Provider, MD  Alum & Mag Hydroxide-Simeth (GI COCKTAIL) SUSP suspension Take 30 mLs by mouth 2 (two) times daily as needed for indigestion. Shake well. Patient not taking: Reported on 03/26/2015 02/16/15   Sherwood Gambler, MD  famotidine (PEPCID) 20 MG tablet Take 1 tablet (20 mg total) by mouth 2 (two) times daily. Patient not taking: Reported on 03/05/2015 03/02/15   Orpah Greek, MD  folic acid (FOLVITE) 1 MG tablet Take 1  tablet (1 mg total) by mouth daily. 01/15/15   [name], MD  lipase/protease/amylase (CREON) 12000 UNITS CPEP capsule Take 2 capsules (24,000 Units total) by mouth 3 (three) times daily with meals. 01/15/15   [name], MD  loperamide (IMODIUM) 2 MG capsule Take 1-2 capsules (2-4 mg total) by mouth as needed for diarrhea or loose stools. 01/23/14   [name], PA-C  metoCLOPramide (REGLAN) 10 MG tablet Take 1 tablet (10 mg total) by mouth every 6 (six) hours. 03/15/15   Charlann Lange, PA-C  metoprolol tartrate (LOPRESSOR) 25 MG tablet Take 1 tablet (25 mg total) by mouth 2 (two) times daily. 01/15/15   [name], MD  Multiple Vitamin (MULTIVITAMIN) capsule Take 1 capsule by mouth daily. 02/06/14   [name], MD  omeprazole (PRILOSEC) 20 MG capsule Take 1 capsule (20 mg total) by mouth daily. 02/08/15   [name], PA-C  ondansetron (ZOFRAN ODT) 4 MG disintegrating tablet Take 1 tablet (4 mg total) by mouth every 8 (eight) hours as needed for nausea. 03/30/15   [name], MD  oxyCODONE-acetaminophen (PERCOCET/ROXICET) 5-325 MG tablet Take 2 tablets by mouth every 4 (four) hours as needed for severe pain. 03/30/15   [name], MD  QUEtiapine (SEROQUEL) 50 MG tablet Take 100 mg by mouth at bedtime. 01/15/15   Historical Provider, MD  sertraline (ZOLOFT) 25 MG tablet Take 3 tablets (75 mg total) by mouth daily. 01/15/15   [name], MD  sildenafil (VIAGRA) 50 MG tablet Take 1 tablet (50 mg total) by mouth daily as needed for erectile dysfunction. 03/31/15   [name], MD  sucralfate (CARAFATE) 1 G tablet Take 1 tablet (1 g total) by mouth 4 (four) times daily -  with meals and at bedtime. 03/15/15   Charlann Lange, PA-C  traMADol (ULTRAM) 50 MG tablet Take 1 tablet (50 mg total) by mouth every 8 (eight) hours as needed. Patient not taking: Reported on 03/15/2015 02/17/15   [name], MD  vitamin B-12 500 MCG tablet Take 1 tablet (500  mcg total) by mouth daily. 03/30/15   [name], MD   BP 161/113 mmHg  Pulse 110  Temp(Src) 97.9 F (36.6 C) (Oral)  Resp 19  SpO2 100% Physical Exam  Constitutional: He is oriented to person, place, and time. He appears well-nourished.  HENT:  Head: Normocephalic.  Mouth/Throat: Oropharynx is clear and moist.  Eyes: Conjunctivae are normal.  Neck: No tracheal deviation present.  Cardiovascular: Normal rate.   Pulmonary/Chest: Effort normal. No stridor. No respiratory distress.  Abdominal: Soft. There is tenderness.  Diffuse tenderness to abdomen, left upper quadrant.  Musculoskeletal: Normal range of motion. He exhibits no edema.  Neurological: He is oriented to person, place, and time. No cranial nerve deficit.  Skin: Skin is warm and dry. No rash noted.  He is not diaphoretic.  Psychiatric: He has a normal mood and affect. His behavior is normal.  Nursing note and vitals reviewed.   ED Course  Procedures (including critical care time) Labs Review Labs Reviewed  LIPASE, BLOOD  COMPREHENSIVE METABOLIC PANEL  CBC  URINALYSIS, ROUTINE W REFLEX MICROSCOPIC (NOT AT [facility])    Imaging Review No results found. I have personally reviewed and evaluated these images and lab results as part of my medical decision-making.   EKG Interpretation None      MDM   Final diagnoses:  None   patient is a 45 year old male well-known to the emergency department for pain with pancreatitis. Patient often presents with acute on chronic pancreatitis. Alcohol induced. Patient discharged roughly 9 days ago after 5 days admission for these symtpoms.  Patient has only had one episode of vomiting. I think this is not symtpomatic enough to warrant admission at this point.  Will see if he can tolerate PO after zofran.  Will trial home medications first.   1:25 PM Patient had 2 percocet.  He is able to tolerate crackers and water by mouth. Will give more mylanta to helpw with symtpoms.     1:54 PM  Patient would like one dose of pain medication and then he states he'll be ready to eat a [place] sandwhcih and go home. vital signs physical exam are normal.   Ruhi Kopke Julio Alm, MD 04/09/15 1355

## 2015-04-09 NOTE — ED Notes (Signed)
Pt has tolerated a cup of ice water with no difficulty

## 2015-04-09 NOTE — Progress Notes (Addendum)
CM went to speak with pt again and noted him walk in to bathroom near his room Cm waited for him to return but as Cm was speaking with a security staff CM noted pt walking to exit door preparing to hit the exit button. Pt in his jeans and hospital gown with iv intact Cm asked pt what he was doing and he informed her he was going to "given my daughter the keys" CM re directed him back to his room.  Pt returned to his room where cm noted pt book bag and phone remaining in the room  CM explained to the pt that him leaving the [facility] ED without making his RN/CNA aware was elopement and Cm offered to see if daughter "Lanelle Bal" was in ED lobby to offer keys but when CM went to lobby there was not anyone name "Lanelle Bal" present Pt states he would call her to see where she was  Cm asked if pt used [place] given to him and he states he was told by the pharmacy he could not because he had used one already in 2016. Pt discussed his compound GI cocktail He states he asked the health Dept MAPP program but they were not able also to assist with this compound med CM again encouraged pt to obtain OC to get assist from discounted pharmacies and GI MD referral  Pt with a blue folder of information present   Entered in [place] or [place] to get an orange card Go to [address] [place],  [address] [phone] or [place] This orange card access will assist you with getting discounted medications and a referral to a GI (stomach) doctor

## 2015-04-09 NOTE — Discharge Instructions (Signed)

## 2015-04-14 ENCOUNTER — Emergency Department (HOSPITAL_COMMUNITY)
Admission: EM | Admit: 2015-04-14 | Discharge: 2015-04-14 | Disposition: A | Discharge status: Home / Self Care | Payer: Self-pay | Attending: Emergency Medicine | Admitting: Emergency Medicine

## 2015-04-14 ENCOUNTER — Encounter (HOSPITAL_COMMUNITY): Payer: Self-pay | Admitting: *Deleted

## 2015-04-14 DIAGNOSIS — G8929 Other chronic pain: Secondary | ICD-10-CM | POA: Insufficient documentation

## 2015-04-14 DIAGNOSIS — M19041 Primary osteoarthritis, right hand: Secondary | ICD-10-CM | POA: Insufficient documentation

## 2015-04-14 DIAGNOSIS — K219 Gastro-esophageal reflux disease without esophagitis: Secondary | ICD-10-CM | POA: Insufficient documentation

## 2015-04-14 DIAGNOSIS — G43909 Migraine, unspecified, not intractable, without status migrainosus: Secondary | ICD-10-CM | POA: Insufficient documentation

## 2015-04-14 DIAGNOSIS — M19022 Primary osteoarthritis, left elbow: Secondary | ICD-10-CM | POA: Insufficient documentation

## 2015-04-14 DIAGNOSIS — F319 Bipolar disorder, unspecified: Secondary | ICD-10-CM | POA: Insufficient documentation

## 2015-04-14 DIAGNOSIS — Z8781 Personal history of (healed) traumatic fracture: Secondary | ICD-10-CM | POA: Insufficient documentation

## 2015-04-14 DIAGNOSIS — R011 Cardiac murmur, unspecified: Secondary | ICD-10-CM | POA: Insufficient documentation

## 2015-04-14 DIAGNOSIS — R1012 Left upper quadrant pain: Secondary | ICD-10-CM

## 2015-04-14 DIAGNOSIS — M19042 Primary osteoarthritis, left hand: Secondary | ICD-10-CM | POA: Insufficient documentation

## 2015-04-14 DIAGNOSIS — Z79899 Other long term (current) drug therapy: Secondary | ICD-10-CM | POA: Insufficient documentation

## 2015-04-14 DIAGNOSIS — Z9889 Other specified postprocedural states: Secondary | ICD-10-CM | POA: Insufficient documentation

## 2015-04-14 DIAGNOSIS — M170 Bilateral primary osteoarthritis of knee: Secondary | ICD-10-CM | POA: Insufficient documentation

## 2015-04-14 DIAGNOSIS — I10 Essential (primary) hypertension: Secondary | ICD-10-CM | POA: Insufficient documentation

## 2015-04-14 DIAGNOSIS — M19021 Primary osteoarthritis, right elbow: Secondary | ICD-10-CM | POA: Insufficient documentation

## 2015-04-14 DIAGNOSIS — R1084 Generalized abdominal pain: Secondary | ICD-10-CM | POA: Insufficient documentation

## 2015-04-14 DIAGNOSIS — F419 Anxiety disorder, unspecified: Secondary | ICD-10-CM | POA: Insufficient documentation

## 2015-04-14 DIAGNOSIS — J45909 Unspecified asthma, uncomplicated: Secondary | ICD-10-CM | POA: Insufficient documentation

## 2015-04-14 DIAGNOSIS — Z8701 Personal history of pneumonia (recurrent): Secondary | ICD-10-CM | POA: Insufficient documentation

## 2015-04-14 DIAGNOSIS — F4310 Post-traumatic stress disorder, unspecified: Secondary | ICD-10-CM | POA: Insufficient documentation

## 2015-04-14 DIAGNOSIS — D649 Anemia, unspecified: Secondary | ICD-10-CM | POA: Insufficient documentation

## 2015-04-14 DIAGNOSIS — E7800 Pure hypercholesterolemia, unspecified: Secondary | ICD-10-CM | POA: Insufficient documentation

## 2015-04-14 DIAGNOSIS — F17210 Nicotine dependence, cigarettes, uncomplicated: Secondary | ICD-10-CM | POA: Insufficient documentation

## 2015-04-14 LAB — COMPREHENSIVE METABOLIC PANEL
ALK PHOS: 116 U/L (ref 38–126)
ALT: 39 U/L (ref 17–63)
AST: 73 U/L — AB (ref 15–41)
Albumin: 3.9 g/dL (ref 3.5–5.0)
Anion gap: 9 (ref 5–15)
BILIRUBIN TOTAL: 0.5 mg/dL (ref 0.3–1.2)
BUN: 9 mg/dL (ref 6–20)
CHLORIDE: 104 mmol/L (ref 101–111)
CO2: 24 mmol/L (ref 22–32)
CREATININE: 0.56 mg/dL — AB (ref 0.61–1.24)
Calcium: 9.2 mg/dL (ref 8.9–10.3)
GFR calc Af Amer: >60 mL/min (ref >60)
Glucose, Bld: 99 mg/dL (ref 65–99)
Potassium: 4.3 mmol/L (ref 3.5–5.1)
Sodium: 137 mmol/L (ref 135–145)
TOTAL PROTEIN: 7.5 g/dL (ref 6.5–8.1)

## 2015-04-14 LAB — CBC WITH DIFFERENTIAL/PLATELET
BASOS ABS: 0 10*3/uL (ref 0.0–0.1)
Basophils Relative: 0 %
EOS PCT: 7 %
Eosinophils Absolute: 0.5 10*3/uL (ref 0.0–0.7)
HCT: 35.7 % — ABNORMAL LOW (ref 39.0–52.0)
Hemoglobin: 11.8 g/dL — ABNORMAL LOW (ref 13.0–17.0)
LYMPHS PCT: 34 %
Lymphs Abs: 2.4 10*3/uL (ref 0.7–4.0)
MCH: 31.9 pg (ref 26.0–34.0)
MCHC: 33.1 g/dL (ref 30.0–36.0)
MCV: 96.5 fL (ref 78.0–100.0)
MONO ABS: 0.6 10*3/uL (ref 0.1–1.0)
Monocytes Relative: 9 %
Neutro Abs: 3.7 10*3/uL (ref 1.7–7.7)
Neutrophils Relative %: 50 %
PLATELETS: 286 10*3/uL (ref 150–400)
RBC: 3.7 MIL/uL — ABNORMAL LOW (ref 4.22–5.81)
RDW: 14.6 % (ref 11.5–15.5)
WBC: 7.2 10*3/uL (ref 4.0–10.5)

## 2015-04-14 LAB — URINALYSIS, ROUTINE W REFLEX MICROSCOPIC
BILIRUBIN URINE: NEGATIVE
GLUCOSE, UA: NEGATIVE mg/dL
HGB URINE DIPSTICK: NEGATIVE
KETONES UR: NEGATIVE mg/dL
LEUKOCYTES UA: NEGATIVE
Nitrite: NEGATIVE
PROTEIN: NEGATIVE mg/dL
Specific Gravity, Urine: 1.018 (ref 1.005–1.030)
pH: 7 (ref 5.0–8.0)

## 2015-04-14 LAB — LIPASE, BLOOD: LIPASE: 35 U/L (ref 11–51)

## 2015-04-14 MED ORDER — SODIUM CHLORIDE 0.9 % IV BOLUS (SEPSIS)
1000.0000 mL | Freq: Once | INTRAVENOUS | Status: AC
  Administered 2015-04-14: 1000 mL via INTRAVENOUS

## 2015-04-14 MED ORDER — GI COCKTAIL ~~LOC~~
30.0000 mL | Freq: Once | ORAL | Status: AC
  Administered 2015-04-14: 30 mL via ORAL
  Filled 2015-04-14: qty 30

## 2015-04-14 MED ORDER — HYDROMORPHONE HCL 1 MG/ML IJ SOLN
1.0000 mg | Freq: Once | INTRAMUSCULAR | Status: AC
  Administered 2015-04-14: 1 mg via INTRAVENOUS
  Filled 2015-04-14: qty 1

## 2015-04-14 MED ORDER — ONDANSETRON HCL 4 MG/2ML IJ SOLN
4.0000 mg | Freq: Once | INTRAMUSCULAR | Status: AC
  Administered 2015-04-14: 4 mg via INTRAVENOUS
  Filled 2015-04-14: qty 2

## 2015-04-14 MED ORDER — OXYCODONE-ACETAMINOPHEN 5-325 MG PO TABS
1.0000 | ORAL_TABLET | Freq: Once | ORAL | Status: AC
  Administered 2015-04-14: 1 via ORAL
  Filled 2015-04-14: qty 1

## 2015-04-14 NOTE — ED Notes (Signed)
Per [facility] - pt from home w/ c/o abd pain that began last night - pt w/ hx of pancreatitis and admits to ETOH use.

## 2015-04-14 NOTE — ED Notes (Signed)
Bed: [id number] Expected date:  Expected time:  Means of arrival:  Comments: abd pain/pancreatitis

## 2015-04-14 NOTE — Discharge Instructions (Signed)
There is not appear to be an emergent cause for your symptoms at this time. It is important for you to follow-up with your doctor in the next 2 or 3 days for reevaluation. It is also poor if you to stop using alcohol as this can contribute to your abdominal pain. Return to ED for reevaluation if you have worsening abdominal pain, nausea and vomiting.fevers or chills.  Chronic Pain Chronic pain can be defined as pain that is off and on and lasts for 3-6 months or longer. Many things cause chronic pain, which can make it difficult to make a diagnosis. There are many treatment options available for chronic pain. However, finding a treatment that works well for you may require trying various approaches until the right one is found. Many people benefit from a combination of two or more types of treatment to control their pain. SYMPTOMS  Chronic pain can occur anywhere in the body and can range from mild to very severe. Some types of chronic pain include:  Headache.  Low back pain.  Cancer pain.  Arthritis pain.  Neurogenic pain. This is pain resulting from damage to nerves. People with chronic pain may also have other symptoms such as:  Depression.  Anger.  Insomnia.  Anxiety. DIAGNOSIS  Your health care provider will help diagnose your condition over time. In many cases, the initial focus will be on excluding possible conditions that could be causing the pain. Depending on your symptoms, your health care provider may order tests to diagnose your condition. Some of these tests may include:   Blood tests.   CT scan.   MRI.   X-rays.   Ultrasounds.   Nerve conduction studies.  You may need to see a specialist.  TREATMENT  Finding treatment that works well may take time. You may be referred to a pain specialist. He or she may prescribe medicine or therapies, such as:   Mindful meditation or yoga.  Shots (injections) of numbing or pain-relieving medicines into the spine or  area of pain.  Local electrical stimulation.  Acupuncture.   Massage therapy.   Aroma, color, light, or sound therapy.   Biofeedback.   Working with a physical therapist to keep from getting stiff.   Regular, gentle exercise.   Cognitive or behavioral therapy.   Group support.  Sometimes, surgery may be recommended.  HOME CARE INSTRUCTIONS   Take all medicines as directed by your health care provider.   Lessen stress in your life by relaxing and doing things such as listening to calming music.   Exercise or be active as directed by your health care provider.   Eat a healthy diet and include things such as vegetables, fruits, fish, and lean meats in your diet.   Keep all follow-up appointments with your health care provider.   Attend a support group with others suffering from chronic pain. SEEK MEDICAL CARE IF:   Your pain gets worse.   You develop a new pain that was not there before.   You cannot tolerate medicines given to you by your health care provider.   You have new symptoms since your last visit with your health care provider.  SEEK IMMEDIATE MEDICAL CARE IF:   You feel weak.   You have decreased sensation or numbness.   You lose control of bowel or bladder function.   Your pain suddenly gets much worse.   You develop shaking.  You develop chills.  You develop confusion.  You develop chest pain.  You develop shortness of breath.  MAKE SURE YOU:  Understand these instructions.  Will watch your condition.  Will get help right away if you are not doing well or get worse.   This information is not intended to replace advice given to you by your health care provider. Make sure you discuss any questions you have with your health care provider.   Document Released: 01/15/2002 Document Revised: 12/26/2012 Document Reviewed: 10/19/2012 Elsevier Interactive Patient Education [employer].   Emergency Department  Resource Guide 1) Find a Doctor and Pay Out of Pocket Although you won't have to find out who is covered by your insurance plan, it is a good idea to ask around and get recommendations. You will then need to call the office and see if the doctor you have chosen will accept you as a new patient and what types of options they offer for patients who are self-pay. Some doctors offer discounts or will set up payment plans for their patients who do not have insurance, but you will need to ask so you aren't surprised when you get to your appointment.  2) Contact Your Local Health Department Not all health departments have doctors that can see patients for sick visits, but many do, so it is worth a call to see if yours does. If you don't know where your local health department is, you can check in your phone book. The CDC also has a tool to help you locate your state's health department, and many state websites also have listings of all of their local health departments.  3) Find a [facility] If your illness is not likely to be very severe or complicated, you may want to try a walk in clinic. These are popping up all over the country in pharmacies, drugstores, and shopping centers. They're usually staffed by nurse practitioners or physician assistants that have been trained to treat common illnesses and complaints. They're usually fairly quick and inexpensive. However, if you have serious medical issues or chronic medical problems, these are probably not your best option.  No Primary Care Doctor: - Call Health Connect at  [phone] - they can help you locate a primary care doctor that  accepts your insurance, provides certain services, etc. - Physician Referral Service- [phone]  Chronic Pain Problems: Organization         Address  Phone   Notes  [facility]  [phone] Patients need to be referred by their primary care doctor.   Medication Assistance: Organization          Address  Phone   Notes  [facility] Medication [facility] [address] [place], [place] [address] [phone] --Must be a resident of [facility] -- Must have NO insurance coverage whatsoever (no Medicaid/ Medicare, etc.) -- The pt. MUST have a primary care doctor that directs their care regularly and follows them in the community   MedAssist  [phone]   [employer]  [phone]    Agencies that provide inexpensive medical care: Organization         Address  Phone   Notes  [place]  [phone]   [facility]    [phone]   [facility] [place], [place] [address] [phone]   [place] [address], [place] [phone]   Planned Parenthood    [phone]   [place]    [phone]   Community Health and [facility]  [address], [place] Phone:  [phone], Fax:  [phone] Hours of Operation:  9 am - 6 pm, M-F.  Also accepts Medicaid/Medicare and self-pay.  [facility] for [place], [address], [place] Phone: [phone], Fax: [phone]. Hours of Operation:  8:30 am - 5:30 pm, M-F.  Also accepts Medicaid and self-pay.  [facility] [place] [address], [place] Phone: [phone]   [name], [place], [place] [phone], Ext. [phone] Mondays & Thursdays: 7-9 AM.  First 15 patients are seen on a first come, first serve basis.    [place] Providers:  Organization         Address  Phone   Notes  [facility] [address], [place] [phone] Also accepts self-pay patients.  [facility] [id number] [place], [place]  [phone]   [place], [address], [place] [phone]   [facility] [address] [place] [phone]   Lucianne Lei [address], [place]   [phone] Only accepts [place] Access [place] patients after they have their name applied to their card.   Self-Pay (no insurance) in [facility]:  Organization         Address  Phone   Notes  Sickle Cell Patients, [facility] [place] [phone]   [facility] [place] [phone]   [facility] [place]  [place], [place], [place] [phone]   [facility]/Dr. Osei-Bonsu  [address] [place] or [address], [place] [phone] Phone number for both [place] and [place] locations is the same.  Urgent Medical and [facility] [address] [place] [phone]   [facility] [address] [place] or [address] [phone]   [facility] [address], [place] [phone], phone; [phone], fax Sees patients 1st and 3rd Saturday of every month.  Must not qualify for public or private insurance (i.e. Medicaid, Medicare, [place] Health Choice, Veterans' Benefits)  Household income should be no more than 200% of the poverty level The clinic cannot treat you if you are pregnant or think you are pregnant  Sexually transmitted diseases are not treated at the clinic.    Dental Care: Organization         Address  Phone  Notes  [facility] Department of [facility] [place] [phone] Accepts children up to age 57 who are enrolled in [place] or [place]; pregnant women with a Medicaid card; and children who have applied for Medicaid or [place] Health Choice, but were declined, whose parents can pay a reduced fee at time of service.  [facility] Department of [facility]  [address], [place] [phone] Accepts children up to age 71 who are enrolled in [place] or [place]; pregnant women with a Medicaid card; and children who have applied for Medicaid or [place] Health Choice, but were declined, whose parents can pay a reduced fee at time of  service.  [facility] Adult [facility] Access PROGRAM  [place] [phone] Patients are seen by appointment only. Walk-ins are not accepted. [place] will see patients 21 years of age and older. Monday - Tuesday (8am-5pm) Most Wednesdays (8:30-5pm) $30 per visit, cash only  [facility] Adult Dental Access PROGRAM  [address], [facility] [phone] Patients are seen by appointment only. Walk-ins are not accepted. [place] will see patients 85 years of age and older. One Wednesday Evening (Monthly: Volunteer Based).  $30 per visit, cash only  [place]  [phone] for adults; Children under age 57, call Graduate Pediatric Dentistry at [phone]. Children aged 81-14, please call [phone] to request a pediatric application.  Dental services are provided in all areas of dental care including fillings, crowns and bridges, complete and partial dentures, implants, gum treatment, root canals, and extractions. Preventive care is also provided. Treatment is provided to both adults and children. Patients are selected via a lottery and there is often a waiting list.   [facility] [address] [place]  [phone] [email or web address]   [facility] [address] [place], [place] [phone], Ext. [phone] Second and Fourth Thursday of each month, opens at 6:30 AM; Clinic ends at 9 AM.  Patients are seen on a first-come first-served basis, and a limited number are seen during each clinic.   [facility]  [address] Hillard Danker [place], [place] [phone]   Eligibility Requirements You must  have lived in [place], [place], or [place] counties for at least the last three months.   You cannot be eligible for state or federal sponsored [employer], including [employer], [place], or [employer].   You generally cannot be eligible for healthcare insurance through your employer.    How to apply: Eligibility screenings are held every Tuesday and Wednesday afternoon from 1:00 pm until 4:00 pm. You do not need an appointment for the interview!  [facility] [address] [place], [place]   [place]  [place] Department  Alpha  [phone]    Behavioral Health Resources in the Community: Intensive Outpatient Programs Organization         Address  Phone  Notes  [place]. [address] [place], [place] [phone]   [facility]patient [address] [place], [place]   ADS: Alcohol & Drug Svcs [address] [place], [place]   Jane Lew [address],  [place], [place] or [phone]   Substance Abuse Resources Organization         Address  Phone  Notes  Al[facility] and Drug Services  [phone]   [place]  [phone]   [facility] [place]   Chinita Pester  [phone]   Residential & Outpatient Substance Abuse Program  [phone]   Psychological Services Organization         Address  Phone  Notes  [facility] [place]  [place]  [phone]   [place] [address], [place] [phone] or [phone]    Mobile Crisis Teams Organization         Address  Phone  Notes  [facility], Mobile Crisis Care Unit  [phone]   Assertive Psychotherapeutic Services  [place], [place]   [facility] [address] [place] [phone]    Self-Help/Support Groups Organization         Address  Phone             Notes  [place]. of [place] - variety of support groups  [place] Call for more information  Narcotics Anonymous (NA), [facility] [address], [employer] [place]  2 meetings at this location   Special educational needs teacher         Address  Phone  Notes  [facility]idential [facility],    [place]  [phone]   [facility]  [address], [place] [id number], [place], [place]   [place], [place] [phone] Admissions: 8am-3pm M-F  Incentives Substance [place] [address]    [place], [place] [id number]   [facility] [address] [place], [place], [place]   [facility] [address],  [place], [place]   [facility] Programs - Intensive Outpatient [address] Kristeen [address], [place], [place]   [facility] ([place].,  [place], [place] [phone] or [phone]   [facility] Services (RTS) [address] [place], [place] Accepts Medicaid  [facility] [place] [address]  [place] [phone] Substance Abuse/Addiction Treatment   [facility] Organization         Address  Phone  Notes  [facility]  [phone]   Domenic Schwab, PhD [address] Arlis [place], [place]   [phone] or [phone]   [place], [place] [phone]   [facility] [address], [place], [place] [phone] Insurance/Medicaid/sponsorship through [facility] and Families [address] [place]                                    [place], [place] [phone] [place] [address], [place] [phone]    Dr. Adele Schilder  [phone]   [facility] Dept. 1) [address], [place] 2) [place] 3)  [place] [address], [place] [phone]  [phone]   [place] [phone] or [phone] (After Hours)

## 2015-04-14 NOTE — ED Provider Notes (Signed)
CSN: [id number]     Arrival date & time 04/14/15  [id number] History   First MD Initiated Contact with Patient 04/14/15 [phone]     Chief Complaint  Patient presents with  . Abdominal Pain     (Consider location/radiation/quality/duration/timing/severity/associated sxs/prior Treatment) HPI [name] is a 44 y.o. male with a history of chronic abdominal pain and alcohol-induced pancreatitis comes in for evaluation of acute abdominal pain. Patient reports he was seen in the ED 5 days ago for same problem but was discharged home. He reports his pain started yesterday, located in his left upper quadrant and is typical of his chronic pancreatitis pain. He does report drinking alcohol prior to his pain starting. Pain rated as moderate. He does report nausea now, no vomiting. No fevers, chills, chest pain, shortness of breath, urinary symptoms, diarrhea or constipation. He does report deep respiration worsens his discomfort.  Past Medical History  Diagnosis Date  . Hypertension   . Asthma   . Pancreatitis   . Cocaine abuse   . Depression   . H/O suicide attempt 10/2012  . Heart murmur     "when he was little" (03/06/2013)  . Anemia   . H/O hiatal hernia   . GERD (gastroesophageal reflux disease)   . Anxiety   . WPW (Wolff-Parkinson-White syndrome)     [name] 03/06/2013  . High cholesterol   . Femoral condyle fracture ([place]) 03/08/2014    left medial/notes 03/09/2014  . Alcoholism /alcohol abuse ([place])   . Family history of adverse reaction to anesthesia     "grandmother gets confused"  . Shortness of breath     "can happen at anytime" (03/06/2013)  . Pneumonia 1990's X 3  . Chronic bronchitis ([place])   . Sickle cell trait ([place])   . History of blood transfusion 10/2012    "when I tried to commit suicide"  . History of stomach ulcers   . Migraine     "a few times/year" (03/26/2015)  . Arthritis     "knees; arms; elbows" (03/26/2015)  . Chronic lower back pain   . Bipolar disorder ([place])    . PTSD (post-traumatic stress disorder)    Past Surgical History  Procedure Laterality Date  . Facial fracture surgery Left 1990's    "result of trauma"   . Eye surgery Left 1990's    "result of trauma"   . Left heart catheterization with coronary angiogram Right 03/07/2013    Procedure: LEFT HEART CATHETERIZATION WITH CORONARY ANGIOGRAM;  Surgeon: [name], MD;  Location: [facility];  Service: Cardiovascular;  Laterality: Right;  . Cardiac catheterization    . Fracture surgery    . Umbilical hernia repair    . Hernia repair     Family History  Problem Relation Age of Onset  . Hypertension Other   . Coronary artery disease Other    Social History  Substance Use Topics  . Smoking status: Current Every Day Smoker -- 1.00 packs/day for 30 years    Types: Cigarettes  . Smokeless tobacco: Current User    Types: Chew  . Alcohol Use: 37.8 oz/week    63 Cans of beer per week     Comment: 03/26/2015 "6-12 beers/day"    Review of Systems A 10 point review of systems was completed and was negative except for pertinent positives and negatives as mentioned in the history of present illness     Allergies  Shellfish-derived products and Trazodone and nefazodone  Home Medications  Prior to Admission medications   Medication Sig Start Date End Date Taking? Authorizing Provider  albuterol (PROVENTIL HFA;VENTOLIN HFA) 108 (90 BASE) MCG/ACT inhaler Inhale 2 puffs into the lungs every 6 (six) hours as needed for wheezing or shortness of breath (wheezing).    Yes Historical Provider, MD  alum & mag hydroxide-simeth (MYLANTA DOUBLE-STRENGTH) 400-400-40 MG/5ML suspension Take 5 mLs by mouth every 6 (six) hours as needed for indigestion. 04/09/15  Yes Courteney Lyn Mackuen, MD  folic acid (FOLVITE) 1 MG tablet Take 1 tablet (1 mg total) by mouth daily. 01/15/15  Yes [name], MD  lipase/protease/amylase (CREON) 12000 UNITS CPEP capsule Take 2 capsules (24,000 Units total) by  mouth 3 (three) times daily with meals. 01/15/15  Yes [name], MD  loperamide (IMODIUM) 2 MG capsule Take 1-2 capsules (2-4 mg total) by mouth as needed for diarrhea or loose stools. 01/23/14  Yes [name], PA-C  metoCLOPramide (REGLAN) 10 MG tablet Take 1 tablet (10 mg total) by mouth every 6 (six) hours. 03/15/15  Yes Shari Upstill, PA-C  metoprolol tartrate (LOPRESSOR) 25 MG tablet Take 1 tablet (25 mg total) by mouth 2 (two) times daily. 01/15/15  Yes [name], MD  Multiple Vitamin (MULTIVITAMIN) capsule Take 1 capsule by mouth daily. 02/06/14  Yes [name], MD  omeprazole (PRILOSEC) 20 MG capsule Take 1 capsule (20 mg total) by mouth daily. 02/08/15  Yes [name], PA-C  ondansetron (ZOFRAN ODT) 4 MG disintegrating tablet Take 1 tablet (4 mg total) by mouth every 8 (eight) hours as needed for nausea. 03/30/15  Yes [name], MD  oxyCODONE-acetaminophen (PERCOCET/ROXICET) 5-325 MG tablet Take 1 tablet by mouth every 6 (six) hours as needed for severe pain. 04/09/15  Yes Courteney Lyn Mackuen, MD  QUEtiapine (SEROQUEL) 50 MG tablet Take 100 mg by mouth at bedtime. 01/15/15  Yes Historical Provider, MD  sertraline (ZOLOFT) 25 MG tablet Take 3 tablets (75 mg total) by mouth daily. 01/15/15  Yes [name], MD  sildenafil (VIAGRA) 50 MG tablet Take 1 tablet (50 mg total) by mouth daily as needed for erectile dysfunction. 03/31/15  Yes Olugbemiga Essie Christine, MD  sucralfate (CARAFATE) 1 G tablet Take 1 tablet (1 g total) by mouth 4 (four) times daily -  with meals and at bedtime. 03/15/15  Yes Shari Upstill, PA-C  traMADol (ULTRAM) 50 MG tablet Take 1 tablet (50 mg total) by mouth every 8 (eight) hours as needed. Patient taking differently: Take 50 mg by mouth every 8 (eight) hours as needed. pain 02/17/15  Yes [name], MD  vitamin B-12 500 MCG tablet Take 1 tablet (500 mcg total) by mouth daily. 03/30/15  Yes [name], MD  Alum & Mag  Hydroxide-Simeth (GI COCKTAIL) SUSP suspension Take 30 mLs by mouth 2 (two) times daily as needed for indigestion. Shake well. Patient not taking: Reported on 03/26/2015 02/16/15   Sherwood Gambler, MD  famotidine (PEPCID) 20 MG tablet Take 1 tablet (20 mg total) by mouth 2 (two) times daily. Patient not taking: Reported on 03/05/2015 03/02/15   Orpah Greek, MD  oxyCODONE-acetaminophen (PERCOCET/ROXICET) 5-325 MG tablet Take 2 tablets by mouth every 4 (four) hours as needed for severe pain. Patient not taking: Reported on 04/14/2015 03/30/15   [name], MD   BP 92/64 mmHg  Pulse 74  Temp(Src) 99 F (37.2 C) (Oral)  Resp 16  SpO2 100% Physical Exam  Constitutional: He is oriented to person, place,  and time. He appears well-developed and well-nourished.  [name]  HENT:  Head: Normocephalic and atraumatic.  Mouth/Throat: Oropharynx is clear and moist.  Eyes: Conjunctivae are normal. Pupils are equal, round, and reactive to light. Right eye exhibits no discharge. Left eye exhibits no discharge. No scleral icterus.  Neck: Normal range of motion. Neck supple.  Cardiovascular: Normal rate, regular rhythm and normal heart sounds.   Pulmonary/Chest: Effort normal and breath sounds normal. No respiratory distress. He has no wheezes. He has no rales.  Abdominal: Soft.  Tenderness diffusely throughout the epigastrium and left upper quadrant. Abdomen is otherwise soft, nondistended. No rebound or guarding. No other lesions or deformities.  Musculoskeletal: He exhibits no tenderness.  Neurological: He is alert and oriented to person, place, and time.  Cranial Nerves II-XII grossly intact  Skin: Skin is warm and dry. No rash noted.  Psychiatric: He has a normal mood and affect.  Nursing note and vitals reviewed.   ED Course  Procedures (including critical care time) Labs Review Labs Reviewed  COMPREHENSIVE METABOLIC PANEL - Abnormal; Notable for the following:     Creatinine, Ser 0.56 (*)    AST 73 (*)    All other components within normal limits  CBC WITH DIFFERENTIAL/PLATELET - Abnormal; Notable for the following:    RBC 3.70 (*)    Hemoglobin 11.8 (*)    HCT 35.7 (*)    All other components within normal limits  LIPASE, BLOOD  URINALYSIS, ROUTINE W REFLEX MICROSCOPIC (NOT AT [facility])    Imaging Review No results found. I have personally reviewed and evaluated these images and lab results as part of my medical decision-making.   EKG Interpretation None     Meds given in ED:  Medications  oxyCODONE-acetaminophen (PERCOCET/ROXICET) 5-325 MG per tablet 1 tablet (1 tablet Oral Given 04/14/15 0752)  sodium chloride 0.9 % bolus 1,000 mL (0 mLs Intravenous Stopped 04/14/15 1001)  ondansetron (ZOFRAN) injection 4 mg (4 mg Intravenous Given 04/14/15 0752)  gi cocktail (Maalox,Lidocaine,Donnatal) (30 mLs Oral Given 04/14/15 0802)  HYDROmorphone (DILAUDID) injection 1 mg (1 mg Intravenous Given 04/14/15 0850)    Discharge Medication List as of 04/14/2015  9:21 AM     Filed Vitals:   04/14/15 0700 04/14/15 0927 04/14/15 0932  BP: 144/103 149/104 92/64  Pulse: 99 82 74  Temp: 99 F (37.2 C)    TempSrc: Oral    Resp: 20 16 16   SpO2: 100% 97% 100%    MDM  [name] is a 44 y.o. male with a history of acute on chronic pancreatitis and is well-known to this ED for this presentation comes in for evaluation of abdominal pain. Pain started after he drank alcohol and is typical of his pancreatitis pain. On arrival, he is hemodynamically stable, afebrile. On exam, he is tender in his left upper quadrant and epigastrium diffusely, otherwise benign physical exam. We will obtain basic labs/lipase. Patient given 1 L normal saline, Zofran and Percocet.  Counseled patient on continued alcohol use and recurrent pancreatitis. Given outpatient resources for substance abuse and alcohol detox. Workup today is unremarkable. Lipase 35, no leukocytosis. Urine  without evidence of infection or stone. Symptoms likely due to chronic pain versus alcoholic gastritis. Has GI medications at home he can take. Low suspicion for other acute or emergent intra-abdominal pathology. No chest pain, shortness of breath, numbness or weakness, diaphoresis. Low suspicion for cardiac or pulmonic etiology. Patient given outpatient resources and instructions to follow-up with PCP in the  next 2-3 days. Strict return precautions given. Overall appears well, nontoxic, hemodynamically stable and afebrile and is appropriate for outpatient follow-up. Final diagnoses:  Abdominal pain, chronic, left upper quadrant        Comer Locket, PA-C 04/14/15 1713  Dorie Rank, MD 04/15/15 1029

## 2015-04-16 ENCOUNTER — Encounter (HOSPITAL_COMMUNITY): Payer: Self-pay | Admitting: *Deleted

## 2015-04-16 ENCOUNTER — Emergency Department (HOSPITAL_COMMUNITY)
Admission: EM | Admit: 2015-04-16 | Discharge: 2015-04-16 | Disposition: A | Discharge status: Home / Self Care | Payer: Self-pay | Attending: Emergency Medicine | Admitting: Emergency Medicine

## 2015-04-16 DIAGNOSIS — F319 Bipolar disorder, unspecified: Secondary | ICD-10-CM | POA: Insufficient documentation

## 2015-04-16 DIAGNOSIS — D649 Anemia, unspecified: Secondary | ICD-10-CM | POA: Insufficient documentation

## 2015-04-16 DIAGNOSIS — F17210 Nicotine dependence, cigarettes, uncomplicated: Secondary | ICD-10-CM | POA: Insufficient documentation

## 2015-04-16 DIAGNOSIS — Z8701 Personal history of pneumonia (recurrent): Secondary | ICD-10-CM | POA: Insufficient documentation

## 2015-04-16 DIAGNOSIS — R011 Cardiac murmur, unspecified: Secondary | ICD-10-CM | POA: Insufficient documentation

## 2015-04-16 DIAGNOSIS — K860 Alcohol-induced chronic pancreatitis: Secondary | ICD-10-CM | POA: Insufficient documentation

## 2015-04-16 DIAGNOSIS — R079 Chest pain, unspecified: Secondary | ICD-10-CM | POA: Insufficient documentation

## 2015-04-16 DIAGNOSIS — G43909 Migraine, unspecified, not intractable, without status migrainosus: Secondary | ICD-10-CM | POA: Insufficient documentation

## 2015-04-16 DIAGNOSIS — G8929 Other chronic pain: Secondary | ICD-10-CM | POA: Insufficient documentation

## 2015-04-16 DIAGNOSIS — F419 Anxiety disorder, unspecified: Secondary | ICD-10-CM | POA: Insufficient documentation

## 2015-04-16 DIAGNOSIS — Z9889 Other specified postprocedural states: Secondary | ICD-10-CM | POA: Insufficient documentation

## 2015-04-16 DIAGNOSIS — F1020 Alcohol dependence, uncomplicated: Secondary | ICD-10-CM | POA: Insufficient documentation

## 2015-04-16 DIAGNOSIS — I10 Essential (primary) hypertension: Secondary | ICD-10-CM | POA: Insufficient documentation

## 2015-04-16 DIAGNOSIS — M158 Other polyosteoarthritis: Secondary | ICD-10-CM | POA: Insufficient documentation

## 2015-04-16 DIAGNOSIS — F4310 Post-traumatic stress disorder, unspecified: Secondary | ICD-10-CM | POA: Insufficient documentation

## 2015-04-16 DIAGNOSIS — J45909 Unspecified asthma, uncomplicated: Secondary | ICD-10-CM | POA: Insufficient documentation

## 2015-04-16 DIAGNOSIS — Z8639 Personal history of other endocrine, nutritional and metabolic disease: Secondary | ICD-10-CM | POA: Insufficient documentation

## 2015-04-16 DIAGNOSIS — Z79899 Other long term (current) drug therapy: Secondary | ICD-10-CM | POA: Insufficient documentation

## 2015-04-16 DIAGNOSIS — K219 Gastro-esophageal reflux disease without esophagitis: Secondary | ICD-10-CM | POA: Insufficient documentation

## 2015-04-16 DIAGNOSIS — Z8781 Personal history of (healed) traumatic fracture: Secondary | ICD-10-CM | POA: Insufficient documentation

## 2015-04-16 LAB — URINALYSIS, ROUTINE W REFLEX MICROSCOPIC
BILIRUBIN URINE: NEGATIVE
Glucose, UA: NEGATIVE mg/dL
HGB URINE DIPSTICK: NEGATIVE
Ketones, ur: NEGATIVE mg/dL
Leukocytes, UA: NEGATIVE
Nitrite: NEGATIVE
PH: 8 (ref 5.0–8.0)
Protein, ur: NEGATIVE mg/dL
SPECIFIC GRAVITY, URINE: 1.019 (ref 1.005–1.030)

## 2015-04-16 LAB — COMPREHENSIVE METABOLIC PANEL
ALBUMIN: 4.2 g/dL (ref 3.5–5.0)
ALT: 37 U/L (ref 17–63)
ANION GAP: 12 (ref 5–15)
AST: 48 U/L — AB (ref 15–41)
Alkaline Phosphatase: 120 U/L (ref 38–126)
BUN: 9 mg/dL (ref 6–20)
CHLORIDE: 102 mmol/L (ref 101–111)
CO2: 26 mmol/L (ref 22–32)
Calcium: 9.2 mg/dL (ref 8.9–10.3)
Creatinine, Ser: 0.71 mg/dL (ref 0.61–1.24)
GFR calc Af Amer: >60 mL/min (ref >60)
GFR calc non Af Amer: >60 mL/min (ref >60)
GLUCOSE: 82 mg/dL (ref 65–99)
POTASSIUM: 3.9 mmol/L (ref 3.5–5.1)
SODIUM: 140 mmol/L (ref 135–145)
Total Bilirubin: 0.6 mg/dL (ref 0.3–1.2)
Total Protein: 8.2 g/dL — ABNORMAL HIGH (ref 6.5–8.1)

## 2015-04-16 LAB — I-STAT TROPONIN, ED: TROPONIN I, POC: 0 ng/mL (ref 0.00–0.08)

## 2015-04-16 LAB — CBC
HCT: 38.1 % — ABNORMAL LOW (ref 39.0–52.0)
HEMOGLOBIN: 12.9 g/dL — AB (ref 13.0–17.0)
MCH: 32.3 pg (ref 26.0–34.0)
MCHC: 33.9 g/dL (ref 30.0–36.0)
MCV: 95.3 fL (ref 78.0–100.0)
Platelets: 258 10*3/uL (ref 150–400)
RBC: 4 MIL/uL — ABNORMAL LOW (ref 4.22–5.81)
RDW: 14.5 % (ref 11.5–15.5)
WBC: 6.5 10*3/uL (ref 4.0–10.5)

## 2015-04-16 LAB — LIPASE, BLOOD: LIPASE: 112 U/L — AB (ref 11–51)

## 2015-04-16 MED ORDER — HYDROMORPHONE HCL 1 MG/ML IJ SOLN
1.0000 mg | Freq: Once | INTRAMUSCULAR | Status: AC
  Administered 2015-04-16: 1 mg via INTRAVENOUS
  Filled 2015-04-16: qty 1

## 2015-04-16 MED ORDER — SODIUM CHLORIDE 0.9 % IV BOLUS (SEPSIS)
1000.0000 mL | Freq: Once | INTRAVENOUS | Status: AC
  Administered 2015-04-16: 1000 mL via INTRAVENOUS

## 2015-04-16 MED ORDER — OXYCODONE-ACETAMINOPHEN 5-325 MG PO TABS: 1.0000 | ORAL_TABLET | ORAL | Status: DC | PRN

## 2015-04-16 MED ORDER — GI COCKTAIL ~~LOC~~
30.0000 mL | Freq: Once | ORAL | Status: AC
  Administered 2015-04-16: 30 mL via ORAL
  Filled 2015-04-16: qty 30

## 2015-04-16 MED ORDER — PROMETHAZINE HCL 25 MG PO TABS: 25.0000 mg | ORAL_TABLET | Freq: Four times a day (QID) | ORAL | Status: DC | PRN

## 2015-04-16 MED ORDER — ONDANSETRON HCL 4 MG/2ML IJ SOLN
4.0000 mg | Freq: Once | INTRAMUSCULAR | Status: AC | PRN
  Administered 2015-04-16: 4 mg via INTRAVENOUS
  Filled 2015-04-16: qty 2

## 2015-04-16 NOTE — ED Notes (Addendum)
Per EMS, pt complains of LUQ abdominal pain, emesis, diarrhea since this morning. Pt was treated 3 days ago for same, states the pain went away then came back this morning. Pt has hx of pancreatitis. Pt states he last had alcohol 4 days ago.

## 2015-04-16 NOTE — Discharge Instructions (Signed)
Do not drink alcohol. Clear liquid diet for the next 24-48 hours. Nausea and pain medication as prescribed. Follow-up with your doctor. Return if worsening.  Acute Pancreatitis Acute pancreatitis is a disease in which the pancreas becomes suddenly inflamed. The pancreas is a large gland located behind your stomach. The pancreas produces enzymes that help digest food. The pancreas also releases the hormones glucagon and insulin that help regulate blood sugar. Damage to the pancreas occurs when the digestive enzymes from the pancreas are activated and begin attacking the pancreas before being released into the intestine. Most acute attacks last a couple of days and can cause serious complications. Some people become dehydrated and develop low blood pressure. In severe cases, bleeding into the pancreas can lead to shock and can be life-threatening. The lungs, heart, and kidneys may fail. CAUSES  Pancreatitis can happen to anyone. In some cases, the cause is unknown. Most cases are caused by:  Alcohol abuse.  Gallstones. Other less common causes are:  Certain medicines.  Exposure to certain chemicals.  Infection.  Damage caused by an accident (trauma).  Abdominal surgery. SYMPTOMS   Pain in the upper abdomen that may radiate to the back.  Tenderness and swelling of the abdomen.  Nausea and vomiting. DIAGNOSIS  Your caregiver will perform a physical exam. Blood and stool tests may be done to confirm the diagnosis. Imaging tests may also be done, such as X-rays, CT scans, or an ultrasound of the abdomen. TREATMENT  Treatment usually requires a stay in the hospital. Treatment may include:  Pain medicine.  Fluid replacement through an intravenous line (IV).  Placing a tube in the stomach to remove stomach contents and control vomiting.  Not eating for 3 or 4 days. This gives your pancreas a rest, because enzymes are not being produced that can cause further damage.  Antibiotic  medicines if your condition is caused by an infection.  Surgery of the pancreas or gallbladder. HOME CARE INSTRUCTIONS   Follow the diet advised by your caregiver. This may involve avoiding alcohol and decreasing the amount of fat in your diet.  Eat smaller, more frequent meals. This reduces the amount of digestive juices the pancreas produces.  Drink enough fluids to keep your urine clear or pale yellow.  Only take over-the-counter or prescription medicines as directed by your caregiver.  Avoid drinking alcohol if it caused your condition.  Do not smoke.  Get plenty of rest.  Check your blood sugar at home as directed by your caregiver.  Keep all follow-up appointments as directed by your caregiver. SEEK MEDICAL CARE IF:   You do not recover as quickly as expected.  You develop new or worsening symptoms.  You have persistent pain, weakness, or nausea.  You recover and then have another episode of pain. SEEK IMMEDIATE MEDICAL CARE IF:   You are unable to eat or keep fluids down.  Your pain becomes severe.  You have a fever or persistent symptoms for more than 2 to 3 days.  You have a fever and your symptoms suddenly get worse.  Your skin or the white part of your eyes turn yellow (jaundice).  You develop vomiting.  You feel dizzy, or you faint.  Your blood sugar is high (over 300 mg/dL). MAKE SURE YOU:   Understand these instructions.  Will watch your condition.  Will get help right away if you are not doing well or get worse.   This information is not intended to replace advice given to you  by your health care provider. Make sure you discuss any questions you have with your health care provider.   Document Released: 04/25/2005 Document Revised: 10/25/2011 Document Reviewed: 08/04/2011 Elsevier Interactive Patient Education [employer].

## 2015-04-16 NOTE — ED Notes (Signed)
Bed: WA20 Expected date:  Expected time:  Means of arrival:  Comments: EMS-ABD PAIN

## 2015-04-16 NOTE — ED Provider Notes (Signed)
CSN: [id number]     Arrival date & time 04/16/15  1345 History   First MD Initiated Contact with Patient 04/16/15 1514     Chief Complaint  Patient presents with  . Abdominal Pain  . Emesis  . Diarrhea     (Consider location/radiation/quality/duration/timing/severity/associated sxs/prior Treatment) HPI [name] is a 44 y.o. male with history of chronic pancreatitis, history of polysubstance abuse, GERD, WPW, sickle cell trait, presents to emergency department complaining of epigastric pain radiating into the chest. Patient states that pain started this morning. Pain is sharp.  He has had 2 episodes of emesis. He had some loose stools this morning as well. He was seen for the same 2 days ago, but states pain improved at that time and he had no symptoms until this morning.pain is worsened with palpation and eating.  Denies any fever or chills. No urinary symptoms. Pain is similar to prior pancreatitis, however he states it's now radiating to the chest. He denies any shortness of breath. He denies any blood in his stool or emesis. No treatment prior to coming in.  Past Medical History  Diagnosis Date  . Hypertension   . Asthma   . Pancreatitis   . Cocaine abuse   . Depression   . H/O suicide attempt 10/2012  . Heart murmur     "when he was little" (03/06/2013)  . Anemia   . H/O hiatal hernia   . GERD (gastroesophageal reflux disease)   . Anxiety   . WPW (Wolff-Parkinson-White syndrome)     [name] 03/06/2013  . High cholesterol   . Femoral condyle fracture ([place]) 03/08/2014    left medial/notes 03/09/2014  . Alcoholism /alcohol abuse ([place])   . Family history of adverse reaction to anesthesia     "grandmother gets confused"  . Shortness of breath     "can happen at anytime" (03/06/2013)  . Pneumonia 1990's X 3  . Chronic bronchitis ([place])   . Sickle cell trait ([place])   . History of blood transfusion 10/2012    "when I tried to commit suicide"  . History of stomach ulcers   .  Migraine     "a few times/year" (03/26/2015)  . Arthritis     "knees; arms; elbows" (03/26/2015)  . Chronic lower back pain   . Bipolar disorder ([place])   . PTSD (post-traumatic stress disorder)    Past Surgical History  Procedure Laterality Date  . Facial fracture surgery Left 1990's    "result of trauma"   . Eye surgery Left 1990's    "result of trauma"   . Left heart catheterization with coronary angiogram Right 03/07/2013    Procedure: LEFT HEART CATHETERIZATION WITH CORONARY ANGIOGRAM;  Surgeon: [name], MD;  Location: [facility];  Service: Cardiovascular;  Laterality: Right;  . Cardiac catheterization    . Fracture surgery    . Umbilical hernia repair    . Hernia repair     Family History  Problem Relation Age of Onset  . Hypertension Other   . Coronary artery disease Other    Social History  Substance Use Topics  . Smoking status: Current Every Day Smoker -- 1.00 packs/day for 30 years    Types: Cigarettes  . Smokeless tobacco: Current User    Types: Chew  . Alcohol Use: 37.8 oz/week    63 Cans of beer per week     Comment: 03/26/2015 "6-12 beers/day"    Review of Systems  Constitutional: Negative for fever  and chills.  Respiratory: Negative for cough, chest tightness and shortness of breath.   Cardiovascular: Positive for chest pain. Negative for palpitations and leg swelling.  Gastrointestinal: Positive for nausea, vomiting, abdominal pain and diarrhea. Negative for blood in stool and abdominal distention.  Genitourinary: Negative for dysuria, urgency, frequency and hematuria.  Musculoskeletal: Negative for myalgias, arthralgias, neck pain and neck stiffness.  Skin: Negative for rash.  Allergic/Immunologic: Negative for immunocompromised state.  Neurological: Negative for dizziness, weakness, light-headedness, numbness and headaches.  All other systems reviewed and are negative.     Allergies  Shellfish-derived products and Trazodone and  nefazodone  Home Medications   Prior to Admission medications   Medication Sig Start Date End Date Taking? Authorizing Provider  albuterol (PROVENTIL HFA;VENTOLIN HFA) 108 (90 BASE) MCG/ACT inhaler Inhale 2 puffs into the lungs every 6 (six) hours as needed for wheezing or shortness of breath (wheezing).    Yes Historical Provider, MD  alum & mag hydroxide-simeth (MYLANTA DOUBLE-STRENGTH) 400-400-40 MG/5ML suspension Take 5 mLs by mouth every 6 (six) hours as needed for indigestion. Patient taking differently: Take 5 mLs by mouth 2 (two) times daily.  04/09/15  Yes Courteney Lyn Mackuen, MD  folic acid (FOLVITE) 1 MG tablet Take 1 tablet (1 mg total) by mouth daily. 01/15/15  Yes [name], MD  lipase/protease/amylase (CREON) 12000 UNITS CPEP capsule Take 2 capsules (24,000 Units total) by mouth 3 (three) times daily with meals. 01/15/15  Yes [name], MD  loperamide (IMODIUM) 2 MG capsule Take 1-2 capsules (2-4 mg total) by mouth as needed for diarrhea or loose stools. 01/23/14  Yes [name], PA-C  metoprolol tartrate (LOPRESSOR) 25 MG tablet Take 1 tablet (25 mg total) by mouth 2 (two) times daily. 01/15/15  Yes [name], MD  Multiple Vitamin (MULTIVITAMIN) capsule Take 1 capsule by mouth daily. 02/06/14  Yes [name], MD  omeprazole (PRILOSEC) 20 MG capsule Take 1 capsule (20 mg total) by mouth daily. 02/08/15  Yes [name], PA-C  ondansetron (ZOFRAN ODT) 4 MG disintegrating tablet Take 1 tablet (4 mg total) by mouth every 8 (eight) hours as needed for nausea. 03/30/15  Yes [name], MD  oxyCODONE-acetaminophen (PERCOCET/ROXICET) 5-325 MG tablet Take 1 tablet by mouth every 6 (six) hours as needed for severe pain. 04/09/15  Yes Courteney Lyn Mackuen, MD  QUEtiapine (SEROQUEL) 50 MG tablet Take 100 mg by mouth at bedtime. 01/15/15  Yes Historical Provider, MD  sertraline (ZOLOFT) 25 MG tablet Take 3 tablets (75 mg total) by mouth daily. 01/15/15  Yes  [name], MD  sildenafil (VIAGRA) 50 MG tablet Take 1 tablet (50 mg total) by mouth daily as needed for erectile dysfunction. 03/31/15  Yes Olugbemiga Essie Christine, MD  sucralfate (CARAFATE) 1 G tablet Take 1 tablet (1 g total) by mouth 4 (four) times daily -  with meals and at bedtime. 03/15/15  Yes Shari Upstill, PA-C  traMADol (ULTRAM) 50 MG tablet Take 1 tablet (50 mg total) by mouth every 8 (eight) hours as needed. Patient taking differently: Take 50 mg by mouth every 8 (eight) hours as needed. pain 02/17/15  Yes [name], MD  vitamin B-12 500 MCG tablet Take 1 tablet (500 mcg total) by mouth daily. 03/30/15  Yes [name], MD  Alum & Mag Hydroxide-Simeth (GI COCKTAIL) SUSP suspension Take 30 mLs by mouth 2 (two) times daily as needed for indigestion. Shake well. Patient not taking: Reported on 04/16/2015 02/16/15  Sherwood Gambler, MD  famotidine (PEPCID) 20 MG tablet Take 1 tablet (20 mg total) by mouth 2 (two) times daily. Patient not taking: Reported on 03/05/2015 03/02/15   Orpah Greek, MD  metoCLOPramide (REGLAN) 10 MG tablet Take 1 tablet (10 mg total) by mouth every 6 (six) hours. 03/15/15   Charlann Lange, PA-C  oxyCODONE-acetaminophen (PERCOCET/ROXICET) 5-325 MG tablet Take 2 tablets by mouth every 4 (four) hours as needed for severe pain. Patient not taking: Reported on 04/14/2015 03/30/15   [name], MD   BP 128/90 mmHg  Pulse 92  Temp(Src) 99 F (37.2 C) (Oral)  Resp 16  SpO2 100% Physical Exam  Constitutional: He is oriented to person, place, and time. He appears well-developed and well-nourished. No distress.  HENT:  Head: Normocephalic and atraumatic.  Eyes: Conjunctivae are normal.  Neck: Neck supple.  Cardiovascular: Normal rate, regular rhythm and normal heart sounds.   Pulmonary/Chest: Effort normal. No respiratory distress. He has no wheezes. He has no rales.  Abdominal: Soft. Bowel sounds are normal. He exhibits no distension.  There is tenderness. There is no rebound.  Epigastric and left upper quadrant tenderness. No guarding  Musculoskeletal: He exhibits no edema.  Neurological: He is alert and oriented to person, place, and time.  Skin: Skin is warm and dry.  Nursing note and vitals reviewed.   ED Course  Procedures (including critical care time) Labs Review Labs Reviewed  LIPASE, BLOOD - Abnormal; Notable for the following:    Lipase 112 (*)    All other components within normal limits  COMPREHENSIVE METABOLIC PANEL - Abnormal; Notable for the following:    Total Protein 8.2 (*)    AST 48 (*)    All other components within normal limits  CBC - Abnormal; Notable for the following:    RBC 4.00 (*)    Hemoglobin 12.9 (*)    HCT 38.1 (*)    All other components within normal limits  URINALYSIS, ROUTINE W REFLEX MICROSCOPIC (NOT AT [facility])  I-STAT TROPOININ, ED    Imaging Review No results found. I have personally reviewed and evaluated these images and lab results as part of my medical decision-making.   EKG Interpretation None      MDM   Final diagnoses:  Alcohol-induced chronic pancreatitis ([place])    patient with acute on chronic abdominal pain. History of chronic alcoholic pancreatitis. Labs already obtained by triage RN, show elevated lipase of 112. Slightly elevated AST at 48. Patient is nontoxic-appearing. No active vomiting. We'll start IV fluids, pain medications ordered, will try GI cocktail for epigastric pain radiating to the chest. Also will get EKG.  7:04 PM Patient received 3 doses of pain medications and IV fluids. He is tolerating by mouth fluids. His pain improved. His lipase is mildly elevated, patient also admitted just not to drink alcohol yesterday. Vital signs are normal. Stable for discharge home at this time. Clear liquid diet at home. I will prescribe him 10 tablets of Phenergan, Percocet for pain and nausea. Return precautions discussed. Instructed to stop drinking  alcohol.  Filed Vitals:   04/16/15 1354 04/16/15 1607  BP: 128/90 136/95  Pulse: 92 100  Temp: 99 F (37.2 C)   TempSrc: Oral   Resp: 16 20  SpO2: 100% 100%     Jeannett Senior, PA-C 04/16/15 [place], DO 04/16/15 1926

## 2015-04-16 NOTE — Progress Notes (Signed)
Patient noted to have been seen in the ED 17 times with 3 admissions within the last six months with similar complaint of abdominal pain, hx of pancreatitis ETOH abuse.  EDCM spoke to patient at bedside.  Patient reports he is still seeing Bevely Palmer [name] at the [facility]  Patient is not homeless.  Patient reports he receives assistance with his medication cost through the [facility].  Patient reports he has filed for disability but was denied do to ETOH abuse.  [facility] consulted EDSW for outpatient resources for alcohol/drug treatment programs.  Patient thankful for resources.  Patient reports he has spoken to [facility] regarding the orange card.   He reports he was supposed to go there today but, "I got to feeling sick."  [facility] encouraged patient to go to [facility] tomorrow or ASAP for follow up.  No further EDCM needs at this time.

## 2015-04-20 ENCOUNTER — Encounter (HOSPITAL_COMMUNITY): Payer: Self-pay | Admitting: Emergency Medicine

## 2015-04-20 ENCOUNTER — Emergency Department (HOSPITAL_COMMUNITY)
Admission: EM | Admit: 2015-04-20 | Discharge: 2015-04-20 | Disposition: A | Discharge status: Home / Self Care | Payer: Self-pay | Attending: Emergency Medicine | Admitting: Emergency Medicine

## 2015-04-20 DIAGNOSIS — R109 Unspecified abdominal pain: Secondary | ICD-10-CM

## 2015-04-20 DIAGNOSIS — G8929 Other chronic pain: Secondary | ICD-10-CM

## 2015-04-20 DIAGNOSIS — R197 Diarrhea, unspecified: Secondary | ICD-10-CM | POA: Insufficient documentation

## 2015-04-20 DIAGNOSIS — I10 Essential (primary) hypertension: Secondary | ICD-10-CM | POA: Insufficient documentation

## 2015-04-20 DIAGNOSIS — F319 Bipolar disorder, unspecified: Secondary | ICD-10-CM | POA: Insufficient documentation

## 2015-04-20 DIAGNOSIS — M1990 Unspecified osteoarthritis, unspecified site: Secondary | ICD-10-CM | POA: Insufficient documentation

## 2015-04-20 DIAGNOSIS — F17210 Nicotine dependence, cigarettes, uncomplicated: Secondary | ICD-10-CM | POA: Insufficient documentation

## 2015-04-20 DIAGNOSIS — R1012 Left upper quadrant pain: Secondary | ICD-10-CM | POA: Insufficient documentation

## 2015-04-20 DIAGNOSIS — F419 Anxiety disorder, unspecified: Secondary | ICD-10-CM | POA: Insufficient documentation

## 2015-04-20 DIAGNOSIS — R1013 Epigastric pain: Secondary | ICD-10-CM | POA: Insufficient documentation

## 2015-04-20 DIAGNOSIS — R011 Cardiac murmur, unspecified: Secondary | ICD-10-CM | POA: Insufficient documentation

## 2015-04-20 DIAGNOSIS — Z862 Personal history of diseases of the blood and blood-forming organs and certain disorders involving the immune mechanism: Secondary | ICD-10-CM | POA: Insufficient documentation

## 2015-04-20 DIAGNOSIS — Z79899 Other long term (current) drug therapy: Secondary | ICD-10-CM | POA: Insufficient documentation

## 2015-04-20 DIAGNOSIS — K219 Gastro-esophageal reflux disease without esophagitis: Secondary | ICD-10-CM | POA: Insufficient documentation

## 2015-04-20 DIAGNOSIS — Z8701 Personal history of pneumonia (recurrent): Secondary | ICD-10-CM | POA: Insufficient documentation

## 2015-04-20 DIAGNOSIS — J45909 Unspecified asthma, uncomplicated: Secondary | ICD-10-CM | POA: Insufficient documentation

## 2015-04-20 DIAGNOSIS — R112 Nausea with vomiting, unspecified: Secondary | ICD-10-CM | POA: Insufficient documentation

## 2015-04-20 LAB — ETHANOL: Alcohol, Ethyl (B): <5 mg/dL (ref <5)

## 2015-04-20 LAB — CBC WITH DIFFERENTIAL/PLATELET
BASOS PCT: 0 %
Basophils Absolute: 0 10*3/uL (ref 0.0–0.1)
EOS ABS: 0.4 10*3/uL (ref 0.0–0.7)
EOS PCT: 6 %
HEMATOCRIT: 39.7 % (ref 39.0–52.0)
Hemoglobin: 13.4 g/dL (ref 13.0–17.0)
LYMPHS PCT: 38 %
Lymphs Abs: 2.4 10*3/uL (ref 0.7–4.0)
MCH: 32.3 pg (ref 26.0–34.0)
MCHC: 33.8 g/dL (ref 30.0–36.0)
MCV: 95.7 fL (ref 78.0–100.0)
MONO ABS: 0.7 10*3/uL (ref 0.1–1.0)
Monocytes Relative: 11 %
NEUTROS ABS: 2.9 10*3/uL (ref 1.7–7.7)
Neutrophils Relative %: 45 %
PLATELETS: 219 10*3/uL (ref 150–400)
RBC: 4.15 MIL/uL — ABNORMAL LOW (ref 4.22–5.81)
RDW: 14.5 % (ref 11.5–15.5)
WBC: 6.4 10*3/uL (ref 4.0–10.5)

## 2015-04-20 LAB — COMPREHENSIVE METABOLIC PANEL
ALK PHOS: 124 U/L (ref 38–126)
ALT: 36 U/L (ref 17–63)
AST: 54 U/L — ABNORMAL HIGH (ref 15–41)
Albumin: 3.7 g/dL (ref 3.5–5.0)
Anion gap: 11 (ref 5–15)
BILIRUBIN TOTAL: 0.8 mg/dL (ref 0.3–1.2)
BUN: 5 mg/dL — AB (ref 6–20)
CALCIUM: 9 mg/dL (ref 8.9–10.3)
CHLORIDE: 106 mmol/L (ref 101–111)
CO2: 22 mmol/L (ref 22–32)
CREATININE: 0.76 mg/dL (ref 0.61–1.24)
Glucose, Bld: 83 mg/dL (ref 65–99)
Potassium: 3.7 mmol/L (ref 3.5–5.1)
Sodium: 139 mmol/L (ref 135–145)
TOTAL PROTEIN: 7.5 g/dL (ref 6.5–8.1)

## 2015-04-20 LAB — LIPASE, BLOOD: Lipase: 32 U/L (ref 11–51)

## 2015-04-20 MED ORDER — PROMETHAZINE HCL 25 MG PO TABS: 25.0000 mg | ORAL_TABLET | Freq: Four times a day (QID) | ORAL | Status: DC | PRN

## 2015-04-20 MED ORDER — SODIUM CHLORIDE 0.9 % IV BOLUS (SEPSIS)
1000.0000 mL | Freq: Once | INTRAVENOUS | Status: AC
  Administered 2015-04-20: 1000 mL via INTRAVENOUS

## 2015-04-20 MED ORDER — GI COCKTAIL ~~LOC~~
30.0000 mL | Freq: Once | ORAL | Status: AC
  Administered 2015-04-20: 30 mL via ORAL
  Filled 2015-04-20: qty 30

## 2015-04-20 MED ORDER — HYDROMORPHONE HCL 1 MG/ML IJ SOLN
1.0000 mg | Freq: Once | INTRAMUSCULAR | Status: AC
  Administered 2015-04-20: 1 mg via INTRAVENOUS
  Filled 2015-04-20: qty 1

## 2015-04-20 MED ORDER — OMEPRAZOLE 20 MG PO CPDR: 20.0000 mg | DELAYED_RELEASE_CAPSULE | Freq: Every day | ORAL | Status: DC

## 2015-04-20 MED ORDER — SODIUM CHLORIDE 0.9 % IV SOLN
Freq: Once | INTRAVENOUS | Status: AC
  Administered 2015-04-20: 08:00:00 via INTRAVENOUS

## 2015-04-20 MED ORDER — OXYCODONE-ACETAMINOPHEN 5-325 MG PO TABS
1.0000 | ORAL_TABLET | ORAL | Status: DC | PRN
Start: 2015-04-20 — End: 2015-04-27

## 2015-04-20 MED ORDER — ONDANSETRON HCL 4 MG/2ML IJ SOLN
4.0000 mg | Freq: Once | INTRAMUSCULAR | Status: AC
  Administered 2015-04-20: 4 mg via INTRAVENOUS
  Filled 2015-04-20: qty 2

## 2015-04-20 NOTE — ED Notes (Signed)
In via [facility] for left upper quad abdominal pain nausea and vomiting.  Reports vomiting 4 times.  Hx of pancreatitis.  Seen here on Thursday but went home.

## 2015-04-20 NOTE — ED Provider Notes (Signed)
CSN: [id number]     Arrival date & time 04/20/15  [id number] History   First MD Initiated Contact with Patient 04/20/15 0622     Chief Complaint  Patient presents with  . Abdominal Pain  . Nausea  . Emesis     (Consider location/radiation/quality/duration/timing/severity/associated sxs/prior Treatment) HPI [name] is a 44 y.o. male with hx of polysubstance abuse, alcohol-induced pancreatitis, GERD, hypertension, asthma, WPW, presents to emergency department complaining of abdominal pain, nausea, vomiting. Patient states that his severe pain started around 1 AM this morning which was approximately 5 hours ago. Patient states he has had several episodes of emesis since then and episode of diarrhea. He was seen at the hospital 4 days ago for the same. He states that he start with clear fluid diet for a day, since then that he has been eating mild food. He does admit to drinking alcohol. He states he felt better up until 1 AM. He states pain is in the left upper quadrant, it is sharp, radiates to the back. Pain is similar to prior pancreatitis. He denies any fever or chills. Denies any blood in his stools or emesis. Denies any melena. He has not taken any medications today for his symptoms.  Past Medical History  Diagnosis Date  . Hypertension   . Asthma   . Pancreatitis   . Cocaine abuse   . Depression   . H/O suicide attempt 10/2012  . Heart murmur     "when he was little" (03/06/2013)  . Anemia   . H/O hiatal hernia   . GERD (gastroesophageal reflux disease)   . Anxiety   . WPW (Wolff-Parkinson-White syndrome)     [name] 03/06/2013  . High cholesterol   . Femoral condyle fracture ([place]) 03/08/2014    left medial/notes 03/09/2014  . Alcoholism /alcohol abuse ([place])   . Family history of adverse reaction to anesthesia     "grandmother gets confused"  . Shortness of breath     "can happen at anytime" (03/06/2013)  . Pneumonia 1990's X 3  . Chronic bronchitis ([place])   . Sickle cell  trait ([place])   . History of blood transfusion 10/2012    "when I tried to commit suicide"  . History of stomach ulcers   . Migraine     "a few times/year" (03/26/2015)  . Arthritis     "knees; arms; elbows" (03/26/2015)  . Chronic lower back pain   . Bipolar disorder ([place])   . PTSD (post-traumatic stress disorder)    Past Surgical History  Procedure Laterality Date  . Facial fracture surgery Left 1990's    "result of trauma"   . Eye surgery Left 1990's    "result of trauma"   . Left heart catheterization with coronary angiogram Right 03/07/2013    Procedure: LEFT HEART CATHETERIZATION WITH CORONARY ANGIOGRAM;  Surgeon: [name], MD;  Location: [place] [facility];  Service: Cardiovascular;  Laterality: Right;  . Cardiac catheterization    . Fracture surgery    . Umbilical hernia repair    . Hernia repair     Family History  Problem Relation Age of Onset  . Hypertension Other   . Coronary artery disease Other    Social History  Substance Use Topics  . Smoking status: Current Every Day Smoker -- 1.00 packs/day for 30 years    Types: Cigarettes  . Smokeless tobacco: Current User    Types: Chew  . Alcohol Use: 37.8 oz/week    63 Cans  of beer per week     Comment: 03/26/2015 "6-12 beers/day"    Review of Systems  Constitutional: Negative for fever and chills.  Respiratory: Negative for cough, chest tightness and shortness of breath.   Cardiovascular: Negative for chest pain, palpitations and leg swelling.  Gastrointestinal: Positive for nausea, vomiting, abdominal pain and diarrhea. Negative for blood in stool and abdominal distention.  Genitourinary: Negative for dysuria and flank pain.  Musculoskeletal: Negative for myalgias, arthralgias, neck pain and neck stiffness.  Skin: Negative for rash.  Allergic/Immunologic: Negative for immunocompromised state.  Neurological: Negative for dizziness, weakness, light-headedness, numbness and headaches.  All other systems reviewed  and are negative.     Allergies  Shellfish-derived products and Trazodone and nefazodone  Home Medications   Prior to Admission medications   Medication Sig Start Date End Date Taking? Authorizing Provider  albuterol (PROVENTIL HFA;VENTOLIN HFA) 108 (90 BASE) MCG/ACT inhaler Inhale 2 puffs into the lungs every 6 (six) hours as needed for wheezing or shortness of breath (wheezing).     Historical Provider, MD  Alum & Mag Hydroxide-Simeth (GI COCKTAIL) SUSP suspension Take 30 mLs by mouth 2 (two) times daily as needed for indigestion. Shake well. Patient not taking: Reported on 04/16/2015 02/16/15   Sherwood Gambler, MD  alum & mag hydroxide-simeth (MYLANTA DOUBLE-STRENGTH) [id number] MG/5ML suspension Take 5 mLs by mouth every 6 (six) hours as needed for indigestion. Patient taking differently: Take 5 mLs by mouth 2 (two) times daily.  04/09/15   Courteney Lyn Mackuen, MD  famotidine (PEPCID) 20 MG tablet Take 1 tablet (20 mg total) by mouth 2 (two) times daily. Patient not taking: Reported on 03/05/2015 03/02/15   Orpah Greek, MD  folic acid (FOLVITE) 1 MG tablet Take 1 tablet (1 mg total) by mouth daily. 01/15/15   [name], MD  lipase/protease/amylase (CREON) 12000 UNITS CPEP capsule Take 2 capsules (24,000 Units total) by mouth 3 (three) times daily with meals. 01/15/15   [name], MD  loperamide (IMODIUM) 2 MG capsule Take 1-2 capsules (2-4 mg total) by mouth as needed for diarrhea or loose stools. 01/23/14   [name], PA-C  metoCLOPramide (REGLAN) 10 MG tablet Take 1 tablet (10 mg total) by mouth every 6 (six) hours. 03/15/15   Charlann Lange, PA-C  metoprolol tartrate (LOPRESSOR) 25 MG tablet Take 1 tablet (25 mg total) by mouth 2 (two) times daily. 01/15/15   [name], MD  Multiple Vitamin (MULTIVITAMIN) capsule Take 1 capsule by mouth daily. 02/06/14   [name], MD  omeprazole (PRILOSEC) 20 MG capsule Take 1 capsule (20 mg total) by  mouth daily. 02/08/15   [name], PA-C  ondansetron (ZOFRAN ODT) 4 MG disintegrating tablet Take 1 tablet (4 mg total) by mouth every 8 (eight) hours as needed for nausea. 03/30/15   [name], MD  oxyCODONE-acetaminophen (PERCOCET) 5-325 MG tablet Take 1 tablet by mouth every 4 (four) hours as needed for severe pain. 04/16/15   Hiilani Jetter, PA-C  promethazine (PHENERGAN) 25 MG tablet Take 1 tablet (25 mg total) by mouth every 6 (six) hours as needed for nausea or vomiting. 04/16/15   Lateia Fraser, PA-C  QUEtiapine (SEROQUEL) 50 MG tablet Take 100 mg by mouth at bedtime. 01/15/15   Historical Provider, MD  sertraline (ZOLOFT) 25 MG tablet Take 3 tablets (75 mg total) by mouth daily. 01/15/15   [name], MD  sildenafil (VIAGRA) 50 MG tablet Take 1 tablet (50 mg total)  by mouth daily as needed for erectile dysfunction. 03/31/15   [name], MD  sucralfate (CARAFATE) 1 G tablet Take 1 tablet (1 g total) by mouth 4 (four) times daily -  with meals and at bedtime. 03/15/15   Charlann Lange, PA-C  traMADol (ULTRAM) 50 MG tablet Take 1 tablet (50 mg total) by mouth every 8 (eight) hours as needed. Patient taking differently: Take 50 mg by mouth every 8 (eight) hours as needed. pain 02/17/15   [name], MD  vitamin B-12 500 MCG tablet Take 1 tablet (500 mcg total) by mouth daily. 03/30/15   [name], MD   BP 140/98 mmHg  Pulse 91  Resp 19  Ht 5\' 9"  (1.753 m)  Wt 63.504 kg  BMI 20.67 kg/m2  SpO2 100% Physical Exam  Constitutional: He is oriented to person, place, and time. He appears well-developed and well-nourished. No distress.  HENT:  Head: Normocephalic and atraumatic.  Eyes: Conjunctivae are normal.  Neck: Neck supple.  Cardiovascular: Normal rate, regular rhythm and normal heart sounds.   Pulmonary/Chest: Effort normal. No respiratory distress. He has no wheezes. He has no rales.  Abdominal: Soft. Bowel sounds are normal. He exhibits no  distension. There is tenderness. There is no rebound and no guarding.  Epigastric and left upper quadrant tenderness  Musculoskeletal: He exhibits no edema.  Neurological: He is alert and oriented to person, place, and time.  Skin: Skin is warm and dry.  Nursing note and vitals reviewed.   ED Course  Procedures (including critical care time) Labs Review Labs Reviewed  CBC WITH DIFFERENTIAL/PLATELET - Abnormal; Notable for the following:    RBC 4.15 (*)    All other components within normal limits  COMPREHENSIVE METABOLIC PANEL - Abnormal; Notable for the following:    BUN 5 (*)    AST 54 (*)    All other components within normal limits  LIPASE, BLOOD  ETHANOL    Imaging Review No results found. I have personally reviewed and evaluated these images and lab results as part of my medical decision-making.   EKG Interpretation None      MDM   Final diagnoses:  Chronic abdominal pain   Pt with reccurent acute on chronic abdominal pain. Pt is non toxic appearing. LUQ tenderness with no guarding, abdomen is soft, non distended. VS normal. Will start IV fluids. Labs collected. Pain meds, anti emetics, gi cocktail ordered.    10:40 AM  pt feeling better. He is tolerating PO fluids. His VS are normal. Abdomen is tender but soft with no guarding. No surgical abdomen. Lipase today is normal. Most likely acute on chronic alcoholic pancreatitis. Last imaged 1 month ago, showing pancreatitis and stable pancreatic cysts. i do not think he needs repeat imaging today. Pt admits drinking yesterday. Encouraged to stop. Will continue on carafate, prilosec, percocet for pain. 48hrs of clear fluids. Phenergan for nausea. Follow up with pcp.   Filed Vitals:   04/20/15 0830 04/20/15 0848 04/20/15 1030 04/20/15 1033  BP: 129/94 129/94 130/96 130/96  Pulse: 86 88 90 82  Resp: 21 18 17 18   Height:      Weight:      SpO2: 97% 98% 99% 99%     Jeannett Senior, PA-C 04/20/15  [place], MD 04/25/15 2317

## 2015-04-20 NOTE — Discharge Instructions (Signed)
Take medications as prescribed. Follow up with primary care doctor.   Acute Pancreatitis Acute pancreatitis is a disease in which the pancreas becomes suddenly inflamed. The pancreas is a large gland located behind your stomach. The pancreas produces enzymes that help digest food. The pancreas also releases the hormones glucagon and insulin that help regulate blood sugar. Damage to the pancreas occurs when the digestive enzymes from the pancreas are activated and begin attacking the pancreas before being released into the intestine. Most acute attacks last a couple of days and can cause serious complications. Some people become dehydrated and develop low blood pressure. In severe cases, bleeding into the pancreas can lead to shock and can be life-threatening. The lungs, heart, and kidneys may fail. CAUSES  Pancreatitis can happen to anyone. In some cases, the cause is unknown. Most cases are caused by:  Alcohol abuse.  Gallstones. Other less common causes are:  Certain medicines.  Exposure to certain chemicals.  Infection.  Damage caused by an accident (trauma).  Abdominal surgery. SYMPTOMS   Pain in the upper abdomen that may radiate to the back.  Tenderness and swelling of the abdomen.  Nausea and vomiting. DIAGNOSIS  Your caregiver will perform a physical exam. Blood and stool tests may be done to confirm the diagnosis. Imaging tests may also be done, such as X-rays, CT scans, or an ultrasound of the abdomen. TREATMENT  Treatment usually requires a stay in the hospital. Treatment may include:  Pain medicine.  Fluid replacement through an intravenous line (IV).  Placing a tube in the stomach to remove stomach contents and control vomiting.  Not eating for 3 or 4 days. This gives your pancreas a rest, because enzymes are not being produced that can cause further damage.  Antibiotic medicines if your condition is caused by an infection.  Surgery of the pancreas or  gallbladder. HOME CARE INSTRUCTIONS   Follow the diet advised by your caregiver. This may involve avoiding alcohol and decreasing the amount of fat in your diet.  Eat smaller, more frequent meals. This reduces the amount of digestive juices the pancreas produces.  Drink enough fluids to keep your urine clear or pale yellow.  Only take over-the-counter or prescription medicines as directed by your caregiver.  Avoid drinking alcohol if it caused your condition.  Do not smoke.  Get plenty of rest.  Check your blood sugar at home as directed by your caregiver.  Keep all follow-up appointments as directed by your caregiver. SEEK MEDICAL CARE IF:   You do not recover as quickly as expected.  You develop new or worsening symptoms.  You have persistent pain, weakness, or nausea.  You recover and then have another episode of pain. SEEK IMMEDIATE MEDICAL CARE IF:   You are unable to eat or keep fluids down.  Your pain becomes severe.  You have a fever or persistent symptoms for more than 2 to 3 days.  You have a fever and your symptoms suddenly get worse.  Your skin or the white part of your eyes turn yellow (jaundice).  You develop vomiting.  You feel dizzy, or you faint.  Your blood sugar is high (over 300 mg/dL). MAKE SURE YOU:   Understand these instructions.  Will watch your condition.  Will get help right away if you are not doing well or get worse.   This information is not intended to replace advice given to you by your health care provider. Make sure you discuss any questions you have with  your health care provider.   Document Released: 04/25/2005 Document Revised: 10/25/2011 Document Reviewed: 08/04/2011 Elsevier Interactive Patient Education [employer].

## 2015-04-20 NOTE — ED Notes (Signed)
Pt tolerated PO well with no vomiting and ambulated to bathroom with no difficulty

## 2015-04-24 ENCOUNTER — Encounter (HOSPITAL_COMMUNITY): Payer: Self-pay | Admitting: Emergency Medicine

## 2015-04-24 ENCOUNTER — Emergency Department (HOSPITAL_COMMUNITY)
Admission: EM | Admit: 2015-04-24 | Discharge: 2015-04-24 | Disposition: A | Discharge status: Home / Self Care | Payer: Self-pay | Attending: Emergency Medicine | Admitting: Emergency Medicine

## 2015-04-24 ENCOUNTER — Emergency Department (HOSPITAL_COMMUNITY): Payer: Self-pay

## 2015-04-24 DIAGNOSIS — Z8739 Personal history of other diseases of the musculoskeletal system and connective tissue: Secondary | ICD-10-CM | POA: Insufficient documentation

## 2015-04-24 DIAGNOSIS — F4310 Post-traumatic stress disorder, unspecified: Secondary | ICD-10-CM | POA: Insufficient documentation

## 2015-04-24 DIAGNOSIS — Z915 Personal history of self-harm: Secondary | ICD-10-CM | POA: Insufficient documentation

## 2015-04-24 DIAGNOSIS — Z8701 Personal history of pneumonia (recurrent): Secondary | ICD-10-CM | POA: Insufficient documentation

## 2015-04-24 DIAGNOSIS — R112 Nausea with vomiting, unspecified: Secondary | ICD-10-CM

## 2015-04-24 DIAGNOSIS — I10 Essential (primary) hypertension: Secondary | ICD-10-CM | POA: Insufficient documentation

## 2015-04-24 DIAGNOSIS — K860 Alcohol-induced chronic pancreatitis: Secondary | ICD-10-CM

## 2015-04-24 DIAGNOSIS — Z79899 Other long term (current) drug therapy: Secondary | ICD-10-CM | POA: Insufficient documentation

## 2015-04-24 DIAGNOSIS — K219 Gastro-esophageal reflux disease without esophagitis: Secondary | ICD-10-CM | POA: Insufficient documentation

## 2015-04-24 DIAGNOSIS — F319 Bipolar disorder, unspecified: Secondary | ICD-10-CM | POA: Insufficient documentation

## 2015-04-24 DIAGNOSIS — F17210 Nicotine dependence, cigarettes, uncomplicated: Secondary | ICD-10-CM | POA: Insufficient documentation

## 2015-04-24 DIAGNOSIS — F419 Anxiety disorder, unspecified: Secondary | ICD-10-CM | POA: Insufficient documentation

## 2015-04-24 DIAGNOSIS — Z9889 Other specified postprocedural states: Secondary | ICD-10-CM | POA: Insufficient documentation

## 2015-04-24 DIAGNOSIS — G8929 Other chronic pain: Secondary | ICD-10-CM | POA: Insufficient documentation

## 2015-04-24 DIAGNOSIS — R011 Cardiac murmur, unspecified: Secondary | ICD-10-CM | POA: Insufficient documentation

## 2015-04-24 DIAGNOSIS — J45909 Unspecified asthma, uncomplicated: Secondary | ICD-10-CM | POA: Insufficient documentation

## 2015-04-24 DIAGNOSIS — R1012 Left upper quadrant pain: Secondary | ICD-10-CM

## 2015-04-24 DIAGNOSIS — D649 Anemia, unspecified: Secondary | ICD-10-CM | POA: Insufficient documentation

## 2015-04-24 DIAGNOSIS — R000 Tachycardia, unspecified: Secondary | ICD-10-CM

## 2015-04-24 DIAGNOSIS — G43909 Migraine, unspecified, not intractable, without status migrainosus: Secondary | ICD-10-CM | POA: Insufficient documentation

## 2015-04-24 DIAGNOSIS — Z8781 Personal history of (healed) traumatic fracture: Secondary | ICD-10-CM | POA: Insufficient documentation

## 2015-04-24 DIAGNOSIS — Z8639 Personal history of other endocrine, nutritional and metabolic disease: Secondary | ICD-10-CM | POA: Insufficient documentation

## 2015-04-24 LAB — COMPREHENSIVE METABOLIC PANEL
ALBUMIN: 4 g/dL (ref 3.5–5.0)
ALT: 65 U/L — AB (ref 17–63)
AST: 149 U/L — AB (ref 15–41)
Alkaline Phosphatase: 133 U/L — ABNORMAL HIGH (ref 38–126)
Anion gap: 11 (ref 5–15)
CHLORIDE: 104 mmol/L (ref 101–111)
CO2: 23 mmol/L (ref 22–32)
CREATININE: 0.75 mg/dL (ref 0.61–1.24)
Calcium: 9.7 mg/dL (ref 8.9–10.3)
GFR calc non Af Amer: >60 mL/min (ref >60)
GLUCOSE: 97 mg/dL (ref 65–99)
Potassium: 4.3 mmol/L (ref 3.5–5.1)
SODIUM: 138 mmol/L (ref 135–145)
Total Bilirubin: 0.7 mg/dL (ref 0.3–1.2)
Total Protein: 8 g/dL (ref 6.5–8.1)

## 2015-04-24 LAB — CBC WITH DIFFERENTIAL/PLATELET
Basophils Absolute: 0 10*3/uL (ref 0.0–0.1)
Basophils Relative: 0 %
EOS ABS: 0.4 10*3/uL (ref 0.0–0.7)
Eosinophils Relative: 4 %
HCT: 42 % (ref 39.0–52.0)
HEMOGLOBIN: 13.9 g/dL (ref 13.0–17.0)
LYMPHS ABS: 2.7 10*3/uL (ref 0.7–4.0)
Lymphocytes Relative: 29 %
MCH: 31.7 pg (ref 26.0–34.0)
MCHC: 33.1 g/dL (ref 30.0–36.0)
MCV: 95.7 fL (ref 78.0–100.0)
MONOS PCT: 7 %
Monocytes Absolute: 0.7 10*3/uL (ref 0.1–1.0)
NEUTROS PCT: 60 %
Neutro Abs: 5.6 10*3/uL (ref 1.7–7.7)
Platelets: 206 10*3/uL (ref 150–400)
RBC: 4.39 MIL/uL (ref 4.22–5.81)
RDW: 14.3 % (ref 11.5–15.5)
WBC: 9.4 10*3/uL (ref 4.0–10.5)

## 2015-04-24 LAB — LIPASE, BLOOD: LIPASE: 45 U/L (ref 11–51)

## 2015-04-24 LAB — I-STAT CG4 LACTIC ACID, ED: Lactic Acid, Venous: 1.82 mmol/L (ref 0.5–2.0)

## 2015-04-24 MED ORDER — PROMETHAZINE HCL 25 MG PO TABS: 25.0000 mg | ORAL_TABLET | Freq: Four times a day (QID) | ORAL | Status: DC | PRN

## 2015-04-24 MED ORDER — IOHEXOL 300 MG/ML  SOLN
100.0000 mL | Freq: Once | INTRAMUSCULAR | Status: AC | PRN
  Administered 2015-04-24: 100 mL via INTRAVENOUS

## 2015-04-24 MED ORDER — HYDROMORPHONE HCL 1 MG/ML IJ SOLN
1.0000 mg | Freq: Once | INTRAMUSCULAR | Status: AC
  Administered 2015-04-24: 1 mg via INTRAVENOUS
  Filled 2015-04-24: qty 1

## 2015-04-24 MED ORDER — SODIUM CHLORIDE 0.9 % IV BOLUS (SEPSIS)
1000.0000 mL | Freq: Once | INTRAVENOUS | Status: AC
  Administered 2015-04-24: 1000 mL via INTRAVENOUS

## 2015-04-24 MED ORDER — OXYCODONE-ACETAMINOPHEN 5-325 MG PO TABS: 1.0000 | ORAL_TABLET | ORAL | Status: DC | PRN

## 2015-04-24 MED ORDER — GI COCKTAIL ~~LOC~~
30.0000 mL | Freq: Once | ORAL | Status: AC
  Administered 2015-04-24: 30 mL via ORAL
  Filled 2015-04-24: qty 30

## 2015-04-24 MED ORDER — KETOROLAC TROMETHAMINE 30 MG/ML IJ SOLN
30.0000 mg | Freq: Once | INTRAMUSCULAR | Status: AC
  Administered 2015-04-24: 30 mg via INTRAVENOUS
  Filled 2015-04-24: qty 1

## 2015-04-24 MED ORDER — ONDANSETRON HCL 4 MG/2ML IJ SOLN
4.0000 mg | Freq: Once | INTRAMUSCULAR | Status: AC
  Administered 2015-04-24: 4 mg via INTRAVENOUS
  Filled 2015-04-24: qty 2

## 2015-04-24 NOTE — ED Provider Notes (Signed)
CSN: [id number]     Arrival date & time 04/24/15  1045 History   First MD Initiated Contact with Patient 04/24/15 1045     Chief Complaint  Patient presents with  . Pancreatitis   HPI   [name] is an 44 y.o. male with history of polysubstance abuse, alcohol-induced pancreatitis, GERD, hypertension, asthma, WPW who presents to the ED for evaluation of abdominal pain. He is well-known to this ED with 22 visits in the past 6 months. He states he woke up this AM around 7AM with sharp LUQ pain that radiates to his back. He endorses associated nausea with 2 episodes of NBNB emesis and 2 episodes of watery non-bloody diarrhea. Reports this feels similar to his prior pancreatitis flares. Denies any fever, chills, chest pain, SOB. He states took 2 percocet and carafate this morning with no relief. Endorses drinking 2 beers a night on average, though none last night. Endorses daily MJ use and smoking 1 PPD. Denies cocaine or other drugs. He states he just got his orange card so is now trying to get in with GI.  Past Medical History  Diagnosis Date  . Hypertension   . Asthma   . Pancreatitis   . Cocaine abuse   . Depression   . H/O suicide attempt 10/2012  . Heart murmur     "when he was little" (03/06/2013)  . Anemia   . H/O hiatal hernia   . GERD (gastroesophageal reflux disease)   . Anxiety   . WPW (Wolff-Parkinson-White syndrome)     [name] 03/06/2013  . High cholesterol   . Femoral condyle fracture ([place]) 03/08/2014    left medial/notes 03/09/2014  . Alcoholism /alcohol abuse ([place])   . Family history of adverse reaction to anesthesia     "grandmother gets confused"  . Shortness of breath     "can happen at anytime" (03/06/2013)  . Pneumonia 1990's X 3  . Chronic bronchitis ([place])   . Sickle cell trait ([place])   . History of blood transfusion 10/2012    "when I tried to commit suicide"  . History of stomach ulcers   . Migraine     "a few times/year" (03/26/2015)  . Arthritis    "knees; arms; elbows" (03/26/2015)  . Chronic lower back pain   . Bipolar disorder ([place])   . PTSD (post-traumatic stress disorder)    Past Surgical History  Procedure Laterality Date  . Facial fracture surgery Left 1990's    "result of trauma"   . Eye surgery Left 1990's    "result of trauma"   . Left heart catheterization with coronary angiogram Right 03/07/2013    Procedure: LEFT HEART CATHETERIZATION WITH CORONARY ANGIOGRAM;  Surgeon: [name], MD;  Location: [facility];  Service: Cardiovascular;  Laterality: Right;  . Cardiac catheterization    . Fracture surgery    . Umbilical hernia repair    . Hernia repair     Family History  Problem Relation Age of Onset  . Hypertension Other   . Coronary artery disease Other    Social History  Substance Use Topics  . Smoking status: Current Every Day Smoker -- 1.00 packs/day for 30 years    Types: Cigarettes  . Smokeless tobacco: Current User    Types: Chew  . Alcohol Use: 37.8 oz/week    63 Cans of beer per week     Comment: 03/26/2015 "6-12 beers/day"    Review of Systems  All other systems reviewed and are  negative.     Allergies  Shellfish-derived products and Trazodone and nefazodone  Home Medications   Prior to Admission medications   Medication Sig Start Date End Date Taking? Authorizing Provider  albuterol (PROVENTIL HFA;VENTOLIN HFA) 108 (90 BASE) MCG/ACT inhaler Inhale 2 puffs into the lungs every 6 (six) hours as needed for wheezing or shortness of breath (wheezing).     Historical Provider, MD  alum & mag hydroxide-simeth (MYLANTA DOUBLE-STRENGTH) 400-400-40 MG/5ML suspension Take 5 mLs by mouth every 6 (six) hours as needed for indigestion. Patient taking differently: Take 5 mLs by mouth 2 (two) times daily.  04/09/15   Courteney Lyn Mackuen, MD  folic acid (FOLVITE) 1 MG tablet Take 1 tablet (1 mg total) by mouth daily. 01/15/15   [name], MD  lipase/protease/amylase (CREON) 12000 UNITS CPEP  capsule Take 2 capsules (24,000 Units total) by mouth 3 (three) times daily with meals. 01/15/15   [name], MD  loperamide (IMODIUM) 2 MG capsule Take 1-2 capsules (2-4 mg total) by mouth as needed for diarrhea or loose stools. 01/23/14   [name], PA-C  metoCLOPramide (REGLAN) 10 MG tablet Take 1 tablet (10 mg total) by mouth every 6 (six) hours. 03/15/15   Charlann Lange, PA-C  metoprolol tartrate (LOPRESSOR) 25 MG tablet Take 1 tablet (25 mg total) by mouth 2 (two) times daily. 01/15/15   [name], MD  Multiple Vitamin (MULTIVITAMIN) capsule Take 1 capsule by mouth daily. 02/06/14   [name], MD  omeprazole (PRILOSEC) 20 MG capsule Take 1 capsule (20 mg total) by mouth daily. 04/20/15   [name], PA-C  ondansetron (ZOFRAN ODT) 4 MG disintegrating tablet Take 1 tablet (4 mg total) by mouth every 8 (eight) hours as needed for nausea. 03/30/15   [name], MD  oxyCODONE-acetaminophen (PERCOCET) 5-325 MG tablet Take 1 tablet by mouth every 4 (four) hours as needed for severe pain. 04/20/15   [name], PA-C  promethazine (PHENERGAN) 25 MG tablet Take 1 tablet (25 mg total) by mouth every 6 (six) hours as needed for nausea or vomiting. 04/20/15   [name], PA-C  QUEtiapine (SEROQUEL) 50 MG tablet Take 100 mg by mouth at bedtime. 01/15/15   Historical Provider, MD  sertraline (ZOLOFT) 25 MG tablet Take 3 tablets (75 mg total) by mouth daily. 01/15/15   [name], MD  sildenafil (VIAGRA) 50 MG tablet Take 1 tablet (50 mg total) by mouth daily as needed for erectile dysfunction. 03/31/15   [name], MD  sucralfate (CARAFATE) 1 G tablet Take 1 tablet (1 g total) by mouth 4 (four) times daily -  with meals and at bedtime. 03/15/15   Charlann Lange, PA-C  traMADol (ULTRAM) 50 MG tablet Take 1 tablet (50 mg total) by mouth every 8 (eight) hours as needed. Patient taking differently: Take 50 mg by mouth every 8 (eight) hours as  needed. pain 02/17/15   [name], MD  vitamin B-12 500 MCG tablet Take 1 tablet (500 mcg total) by mouth daily. 03/30/15   [name], MD   There were no vitals taken for this visit. Physical Exam  Constitutional: He is oriented to person, place, and time.  Appears uncomfortable, chronically ill, NAD  HENT:  Right Ear: External ear normal.  Left Ear: External ear normal.  Nose: Nose normal.  Mouth/Throat: Oropharynx is clear and moist. No oropharyngeal exudate.  Eyes: Conjunctivae and EOM are normal. Pupils are equal, round, and reactive to  light.  Neck: Normal range of motion. Neck supple.  Cardiovascular: Normal rate, regular rhythm, normal heart sounds and intact distal pulses.   No murmur heard. Pulmonary/Chest: Effort normal and breath sounds normal. No respiratory distress. He has no wheezes.  Abdominal: Soft. Bowel sounds are normal. He exhibits no distension. There is tenderness in the left upper quadrant. There is no rigidity, no rebound, no guarding and no CVA tenderness.  Musculoskeletal: He exhibits no edema.  Neurological: He is alert and oriented to person, place, and time. No cranial nerve deficit.  Skin: Skin is warm and dry.  Psychiatric: He has a normal mood and affect.  Nursing note and vitals reviewed.   ED Course  Procedures (including critical care time) Labs Review Labs Reviewed  COMPREHENSIVE METABOLIC PANEL - Abnormal; Notable for the following:    BUN <5 (*)    AST 149 (*)    ALT 65 (*)    Alkaline Phosphatase 133 (*)    All other components within normal limits  CBC WITH DIFFERENTIAL/PLATELET  LIPASE, BLOOD  I-STAT CG4 LACTIC ACID, ED    Imaging Review Ct Abdomen Pelvis W Contrast  04/24/2015  CLINICAL DATA:  Abdominal pain just above the umbilicus. Vomiting. History pancreatitis. Cocaine abuse. EXAM: CT ABDOMEN AND PELVIS WITH CONTRAST TECHNIQUE: Multidetector CT imaging of the abdomen and pelvis was performed using the standard  protocol following bolus administration of intravenous contrast. CONTRAST:  163mL OMNIPAQUE IOHEXOL 300 MG/ML  SOLN COMPARISON:  03/26/2015 FINDINGS: Lower chest: Clear lung bases. Normal heart size without pericardial or pleural effusion. Hepatobiliary: Normal liver. Normal gallbladder, without biliary ductal dilatation. Pancreas: Cystic lesion within the pancreatic head/ uncinate process measures 1.7 cm on image 36/ series 201. This measures 1.3 cm on the prior exam. Decreased size of a cystic lesion the pancreatic neck, which measures only 9 mm today. pancreatic tail cystic lesion measures 1.6 cm today and is similar. No residual pancreatitis identified. No duct dilatation. Spleen: Normal in size, without focal abnormality. Adrenals/Urinary Tract: Normal adrenal glands. Normal kidneys, without hydronephrosis. Normal urinary bladder. Stomach/Bowel: Gastric wall thickening, with the greater curvature measuring 2.6 cm. This is similar to on the prior. Normal colon and terminal ileum. Normal appendix. Vascular/Lymphatic: Aortic and branch vessel atherosclerosis. No abdominopelvic adenopathy. Reproductive: Normal prostate. Other: No significant free fluid. Musculoskeletal: No acute osseous abnormality. IMPRESSION: 1. Re- demonstration of pancreatic pseudocysts. These are varying in size, as detailed above. No evidence of residual acute pancreatitis. 2. Persistent gastric wall thickening, suspicious for gastritis. 3. Age advanced atherosclerosis. Electronically Signed   By: Abigail Miyamoto M.D.   On: 04/24/2015 14:38   I have personally reviewed and evaluated these images and lab results as part of my medical decision-making.   EKG Interpretation None      MDM   Final diagnoses:  Alcohol-induced chronic pancreatitis (HCC)  Left upper quadrant pain  Non-intractable vomiting with nausea, vomiting of unspecified type  Tachycardia   Pain controlled with 2mg  dilaudid, 30 toradol, gi cocktail. 1L Bolus given.  However pt continues to be persistently tachycardic to 110-115. Denies cp, SOB, palpitation, lightheadedness. Does have h/o WPW. Will get EKG. Discussed with attending Dr. Tyrone Nine. Will check lactate and ct abd/pelvis to make sure no acute changes in abdominal findings. Otherwise if pt can continue to tolerate PO will d/c home on clear liquid diet with few more pain pills and GI f/u.  CT negative. One last mg dilaudid given delay in d/c for CT. Tolerated ice chips PO.  F/U with pcp regarding tachycardia. F/u with gi.. Return precautions given.  Anne Ng, PA-C 04/24/15 [place], DO 04/25/15 [phone]

## 2015-04-24 NOTE — Discharge Instructions (Signed)
You were seen int he ER today for evaluation of abdominal pain. Your lipase is unremarkable. Your labs are otherwise unremarkable. We were able to control your pain and nausea. Please call GI to schedule an appointment now that you have your orange card. In the meantime I will give you a prescription for a few Percocet for pain and Phenergan for nausea. Please continue to take your other medications as prescribed.  We obtained a CT scan given your persistently elevated heart rate but there were no acute findings. Your EKG was normal. Please follow up with your primary care provider within one week for evaluation. If you do not have one please call [facility]. Please talk to them about your high heart rate. Return to the ER for new or worsening symptoms.

## 2015-04-24 NOTE — ED Notes (Signed)
Pt having pancreatis flair up; denies drinking alcohol recently; coming from home. Diarr and nausea. This started around 0830 today.

## 2015-04-26 ENCOUNTER — Emergency Department (HOSPITAL_COMMUNITY)
Admission: EM | Admit: 2015-04-26 | Discharge: 2015-04-27 | Disposition: A | Discharge status: Home / Self Care | Payer: Self-pay | Attending: Emergency Medicine | Admitting: Emergency Medicine

## 2015-04-26 ENCOUNTER — Encounter (HOSPITAL_COMMUNITY): Payer: Self-pay | Admitting: Emergency Medicine

## 2015-04-26 DIAGNOSIS — R112 Nausea with vomiting, unspecified: Secondary | ICD-10-CM | POA: Insufficient documentation

## 2015-04-26 DIAGNOSIS — Z8701 Personal history of pneumonia (recurrent): Secondary | ICD-10-CM | POA: Insufficient documentation

## 2015-04-26 DIAGNOSIS — F419 Anxiety disorder, unspecified: Secondary | ICD-10-CM | POA: Insufficient documentation

## 2015-04-26 DIAGNOSIS — J45909 Unspecified asthma, uncomplicated: Secondary | ICD-10-CM | POA: Insufficient documentation

## 2015-04-26 DIAGNOSIS — F4310 Post-traumatic stress disorder, unspecified: Secondary | ICD-10-CM | POA: Insufficient documentation

## 2015-04-26 DIAGNOSIS — D649 Anemia, unspecified: Secondary | ICD-10-CM | POA: Insufficient documentation

## 2015-04-26 DIAGNOSIS — I10 Essential (primary) hypertension: Secondary | ICD-10-CM | POA: Insufficient documentation

## 2015-04-26 DIAGNOSIS — Z8781 Personal history of (healed) traumatic fracture: Secondary | ICD-10-CM | POA: Insufficient documentation

## 2015-04-26 DIAGNOSIS — R6883 Chills (without fever): Secondary | ICD-10-CM | POA: Insufficient documentation

## 2015-04-26 DIAGNOSIS — K219 Gastro-esophageal reflux disease without esophagitis: Secondary | ICD-10-CM | POA: Insufficient documentation

## 2015-04-26 DIAGNOSIS — R011 Cardiac murmur, unspecified: Secondary | ICD-10-CM | POA: Insufficient documentation

## 2015-04-26 DIAGNOSIS — Z9889 Other specified postprocedural states: Secondary | ICD-10-CM | POA: Insufficient documentation

## 2015-04-26 DIAGNOSIS — Z79899 Other long term (current) drug therapy: Secondary | ICD-10-CM | POA: Insufficient documentation

## 2015-04-26 DIAGNOSIS — R1012 Left upper quadrant pain: Secondary | ICD-10-CM | POA: Insufficient documentation

## 2015-04-26 DIAGNOSIS — Z8639 Personal history of other endocrine, nutritional and metabolic disease: Secondary | ICD-10-CM | POA: Insufficient documentation

## 2015-04-26 DIAGNOSIS — M549 Dorsalgia, unspecified: Secondary | ICD-10-CM | POA: Insufficient documentation

## 2015-04-26 DIAGNOSIS — R5383 Other fatigue: Secondary | ICD-10-CM | POA: Insufficient documentation

## 2015-04-26 DIAGNOSIS — F319 Bipolar disorder, unspecified: Secondary | ICD-10-CM | POA: Insufficient documentation

## 2015-04-26 DIAGNOSIS — R1013 Epigastric pain: Secondary | ICD-10-CM | POA: Insufficient documentation

## 2015-04-26 DIAGNOSIS — G8929 Other chronic pain: Secondary | ICD-10-CM | POA: Insufficient documentation

## 2015-04-26 NOTE — ED Notes (Signed)
Pt reports N/V/D since 4:30pm today.  He states the pain is a 10 out of 10.  Reports that he has tried his medications down however he is unable to keep them down.

## 2015-04-26 NOTE — ED Provider Notes (Signed)
CSN: [id number]     Arrival date & time 04/26/15  2329 History  By signing my name below, I, Jolayne Panther, attest that this documentation has been prepared under the direction and in the presence of Merrily Pew, MD. Electronically Signed: Jolayne Panther, Scribe. 04/26/2015. 6:04 AM.    Chief Complaint  Patient presents with  . Abdominal Pain   The history is provided by the patient. No language interpreter was used.   HPI Comments: [name] is a 44 y.o. male with a hx of pancreatitis who presents to the Emergency Department complaining of burning, LLQ abdominal pain and associated emesis, fatigue and chills onset today. He also notes left flank pain and says the pain radiates into his back. Pt notes that he reported to ED three days ago but was sent home after he was able to keep some fluids down. Pt reports that he began experiencing abdominal pain again today nine hours ago. He notes that he has vomited about six times today and states that his last bowel movement was an hour and a half ago. Pt took two promethazine with no relief. [name] friend told him if he drank a beer it would relieve the pain so pt drank a beer with no relief as well. Pt has a hx of WPW, HTN, and asthma. Pt denies fever, dysuria, urinary frequency and hx of kidney stones.    Past Medical History  Diagnosis Date  . Hypertension   . Asthma   . Pancreatitis   . Cocaine abuse   . Depression   . H/O suicide attempt 10/2012  . Heart murmur     "when he was little" (03/06/2013)  . Anemia   . H/O hiatal hernia   . GERD (gastroesophageal reflux disease)   . Anxiety   . WPW (Wolff-Parkinson-White syndrome)     [name] 03/06/2013  . High cholesterol   . Femoral condyle fracture ([place]) 03/08/2014    left medial/notes 03/09/2014  . Alcoholism /alcohol abuse ([place])   . Family history of adverse reaction to anesthesia     "grandmother gets confused"  . Shortness of breath     "can happen at anytime"  (03/06/2013)  . Pneumonia 1990's X 3  . Chronic bronchitis ([place])   . Sickle cell trait ([place])   . History of blood transfusion 10/2012    "when I tried to commit suicide"  . History of stomach ulcers   . Migraine     "a few times/year" (03/26/2015)  . Arthritis     "knees; arms; elbows" (03/26/2015)  . Chronic lower back pain   . Bipolar disorder ([place])   . PTSD (post-traumatic stress disorder)    Past Surgical History  Procedure Laterality Date  . Facial fracture surgery Left 1990's    "result of trauma"   . Eye surgery Left 1990's    "result of trauma"   . Left heart catheterization with coronary angiogram Right 03/07/2013    Procedure: LEFT HEART CATHETERIZATION WITH CORONARY ANGIOGRAM;  Surgeon: [name], MD;  Location: [facility];  Service: Cardiovascular;  Laterality: Right;  . Cardiac catheterization    . Fracture surgery    . Umbilical hernia repair    . Hernia repair     Family History  Problem Relation Age of Onset  . Hypertension Other   . Coronary artery disease Other    Social History  Substance Use Topics  . Smoking status: Current Every Day Smoker -- 1.00 packs/day  for 30 years    Types: Cigarettes  . Smokeless tobacco: Current User    Types: Chew  . Alcohol Use: 37.8 oz/week    63 Cans of beer per week     Comment: 03/26/2015 "6-12 beers/day"    Review of Systems  Constitutional: Positive for chills and fatigue. Negative for fever.  Gastrointestinal: Positive for nausea, vomiting and abdominal pain.  Genitourinary: Negative for dysuria and frequency.  Musculoskeletal: Positive for back pain.  All other systems reviewed and are negative.   Allergies  Shellfish-derived products and Diazepam  Home Medications   Prior to Admission medications   Medication Sig Start Date End Date Taking? Authorizing Provider  albuterol (PROVENTIL HFA;VENTOLIN HFA) 108 (90 BASE) MCG/ACT inhaler Inhale 2 puffs into the lungs every 6 (six) hours as needed for  wheezing or shortness of breath (wheezing).    Yes Historical Provider, MD  alum & mag hydroxide-simeth (MYLANTA DOUBLE-STRENGTH) 400-400-40 MG/5ML suspension Take 5 mLs by mouth every 6 (six) hours as needed for indigestion. Patient taking differently: Take 5 mLs by mouth 2 (two) times daily.  04/09/15  Yes Courteney Lyn Mackuen, MD  folic acid (FOLVITE) 1 MG tablet Take 1 tablet (1 mg total) by mouth daily. 01/15/15  Yes [name], MD  lipase/protease/amylase (CREON) 12000 UNITS CPEP capsule Take 2 capsules (24,000 Units total) by mouth 3 (three) times daily with meals. 01/15/15  Yes [name], MD  metoprolol tartrate (LOPRESSOR) 25 MG tablet Take 1 tablet (25 mg total) by mouth 2 (two) times daily. 01/15/15  Yes [name], MD  Multiple Vitamin (MULTIVITAMIN) capsule Take 1 capsule by mouth daily. 02/06/14  Yes [name], MD  omeprazole (PRILOSEC) 20 MG capsule Take 1 capsule (20 mg total) by mouth daily. 04/20/15  Yes [name], PA-C  oxyCODONE-acetaminophen (PERCOCET/ROXICET) 5-325 MG tablet Take 1 tablet by mouth every 4 (four) hours as needed for severe pain. 04/24/15  Yes Olivia Canter Sam, PA-C  promethazine (PHENERGAN) 25 MG tablet Take 1 tablet (25 mg total) by mouth every 6 (six) hours as needed for nausea or vomiting. 04/20/15  Yes [name], PA-C  QUEtiapine (SEROQUEL) 50 MG tablet Take 100 mg by mouth at bedtime. 01/15/15  Yes Historical Provider, MD  sertraline (ZOLOFT) 25 MG tablet Take 3 tablets (75 mg total) by mouth daily. 01/15/15  Yes Olugbemiga Essie Christine, MD  sucralfate (CARAFATE) 1 G tablet Take 1 tablet (1 g total) by mouth 4 (four) times daily -  with meals and at bedtime. 03/15/15  Yes Shari Upstill, PA-C  vitamin B-12 500 MCG tablet Take 1 tablet (500 mcg total) by mouth daily. 03/30/15  Yes [name], MD  loperamide (IMODIUM) 2 MG capsule Take 1-2 capsules (2-4 mg total) by mouth as needed for diarrhea or loose stools. Patient not  taking: Reported on 04/27/2015 01/23/14   Melton Alar, PA-C  metoCLOPramide (REGLAN) 10 MG tablet Take 1 tablet (10 mg total) by mouth every 6 (six) hours. Patient not taking: Reported on 04/24/2015 03/15/15   Charlann Lange, PA-C  ondansetron (ZOFRAN ODT) 4 MG disintegrating tablet Take 1 tablet (4 mg total) by mouth every 8 (eight) hours as needed for nausea. Patient not taking: Reported on 04/24/2015 03/30/15   [name], MD  sildenafil (VIAGRA) 50 MG tablet Take 1 tablet (50 mg total) by mouth daily as needed for erectile dysfunction. 03/31/15   [name], MD  traMADol (ULTRAM) 50 MG tablet Take 1 tablet (50  mg total) by mouth every 8 (eight) hours as needed. Patient not taking: Reported on 04/24/2015 02/17/15   [name], MD   BP 115/97 mmHg  Pulse 89  Temp(Src) 97.6 F (36.4 C) (Oral)  Resp 16  SpO2 100% Physical Exam  Constitutional: He is oriented to person, place, and time. He appears well-developed and well-nourished. No distress.  HENT:  Head: Normocephalic.  Eyes: Conjunctivae are normal.  Cardiovascular: Normal rate, regular rhythm and normal heart sounds.  Exam reveals no gallop and no friction rub.   No murmur heard. Pulmonary/Chest: Effort normal and breath sounds normal. No respiratory distress. He has no wheezes. He has no rales.  Abdominal: Soft. Bowel sounds are normal. He exhibits no distension.  Tenderness to palpation to LUQ of abdomen   Neurological: He is alert and oriented to person, place, and time.  Skin: Skin is warm and dry.  Psychiatric: He has a normal mood and affect.  Nursing note and vitals reviewed.   ED Course  Procedures  DIAGNOSTIC STUDIES:    Oxygen Saturation is 99% on RA, normal by my interpretation.   COORDINATION OF CARE:  11:56 PM Will administer pt dose of pain and nausea medication in the ED. Discussed treatment plan with pt at bedside and pt agreed to plan.   Labs Review Labs Reviewed  CBC WITH  DIFFERENTIAL/PLATELET - Abnormal; Notable for the following:    RBC 4.02 (*)    Hemoglobin 12.8 (*)    HCT 38.5 (*)    All other components within normal limits  COMPREHENSIVE METABOLIC PANEL - Abnormal; Notable for the following:    Chloride 100 (*)    Total Protein 8.2 (*)    AST 63 (*)    ALT 69 (*)    Total Bilirubin 1.5 (*)    All other components within normal limits  LIPASE, BLOOD    Imaging Review No results found. I have personally reviewed and evaluated these lab results as part of my medical decision-making.   MDM   Final diagnoses:  Epigastric pain   44 year old male with a history of alcohol induced pancreatitis,here with abdominal pain similar to previous episodes of pancreatitis. Stated that a friend told him if he drank a beer he would be better however it only made it worse. I asked him if he believed the one friend and told him to drink a beer over all the doctors that documented on counseling him to quit drinking in the past. Patient did not have an answer and after long pause, he reiterated to me that he thought alcohol would help. His exam was relatively benign as above. He apparently had multiple episodes of vomiting prior to arrival however did not have any witnessed vomiting in the emergency department. Every time I went to reevaluate the patient he was resting comfortably but when we wake him up to ask him how his pain was he would writhe around in apparent agony. His lipase was normal. Repeat abdominal exams without evidence of surgical cause. Rest of labs within normal limits as well. I suspect a component of malingering, however I doubt any acute intra-abdominal pathology to require further workup or treatment in the emergency department.   I personally performed the services described in this documentation, which was scribed in my presence. The recorded information has been reviewed and is accurate.    Merrily Pew, MD 04/27/15 [phone]

## 2015-04-27 LAB — CBC WITH DIFFERENTIAL/PLATELET
BASOS ABS: 0 10*3/uL (ref 0.0–0.1)
BASOS PCT: 0 %
EOS ABS: 0.3 10*3/uL (ref 0.0–0.7)
EOS PCT: 4 %
HCT: 38.5 % — ABNORMAL LOW (ref 39.0–52.0)
HEMOGLOBIN: 12.8 g/dL — AB (ref 13.0–17.0)
LYMPHS ABS: 3.6 10*3/uL (ref 0.7–4.0)
Lymphocytes Relative: 38 %
MCH: 31.8 pg (ref 26.0–34.0)
MCHC: 33.2 g/dL (ref 30.0–36.0)
MCV: 95.8 fL (ref 78.0–100.0)
Monocytes Absolute: 0.7 10*3/uL (ref 0.1–1.0)
Monocytes Relative: 8 %
NEUTROS PCT: 50 %
Neutro Abs: 4.7 10*3/uL (ref 1.7–7.7)
PLATELETS: 205 10*3/uL (ref 150–400)
RBC: 4.02 MIL/uL — AB (ref 4.22–5.81)
RDW: 14.1 % (ref 11.5–15.5)
WBC: 9.3 10*3/uL (ref 4.0–10.5)

## 2015-04-27 LAB — COMPREHENSIVE METABOLIC PANEL
ALBUMIN: 4.2 g/dL (ref 3.5–5.0)
ALK PHOS: 123 U/L (ref 38–126)
ALT: 69 U/L — AB (ref 17–63)
AST: 63 U/L — AB (ref 15–41)
Anion gap: 13 (ref 5–15)
BUN: 7 mg/dL (ref 6–20)
CALCIUM: 9.2 mg/dL (ref 8.9–10.3)
CHLORIDE: 100 mmol/L — AB (ref 101–111)
CO2: 25 mmol/L (ref 22–32)
CREATININE: 0.61 mg/dL (ref 0.61–1.24)
GFR calc non Af Amer: >60 mL/min (ref >60)
GLUCOSE: 87 mg/dL (ref 65–99)
Potassium: 3.6 mmol/L (ref 3.5–5.1)
SODIUM: 138 mmol/L (ref 135–145)
Total Bilirubin: 1.5 mg/dL — ABNORMAL HIGH (ref 0.3–1.2)
Total Protein: 8.2 g/dL — ABNORMAL HIGH (ref 6.5–8.1)

## 2015-04-27 LAB — LIPASE, BLOOD: Lipase: 40 U/L (ref 11–51)

## 2015-04-27 MED ORDER — OXYCODONE-ACETAMINOPHEN 5-325 MG PO TABS
2.0000 | ORAL_TABLET | Freq: Once | ORAL | Status: AC
  Administered 2015-04-27: 2 via ORAL
  Filled 2015-04-27: qty 2

## 2015-04-27 MED ORDER — PROMETHAZINE HCL 25 MG/ML IJ SOLN
25.0000 mg | Freq: Once | INTRAMUSCULAR | Status: AC
  Administered 2015-04-27: 25 mg via INTRAVENOUS
  Filled 2015-04-27: qty 1

## 2015-04-27 MED ORDER — GI COCKTAIL ~~LOC~~
30.0000 mL | Freq: Once | ORAL | Status: AC
Start: 2015-04-27 — End: 2015-04-27
  Administered 2015-04-27: 30 mL via ORAL
  Filled 2015-04-27: qty 30

## 2015-04-27 MED ORDER — HYDROMORPHONE HCL 1 MG/ML IJ SOLN
1.0000 mg | Freq: Once | INTRAMUSCULAR | Status: AC
  Administered 2015-04-27: 1 mg via INTRAVENOUS
  Filled 2015-04-27: qty 1

## 2015-04-27 NOTE — ED Notes (Signed)
Patient is resting comfortably. 

## 2015-04-27 NOTE — ED Notes (Signed)
PO challenge started as ordered by MD, pt states the fluids makes his Abd to hurt more, pt states is more than one hour that he got pain medication and he needs more pain medication, pt oriented that MD is aware of his request for pain medication and we are waiting for further orders from MD, pt encouraged to take the fluids as ordered.

## 2015-04-27 NOTE — ED Notes (Signed)
Pt continues to state that "it hurts to drink". MD notified.

## 2015-05-01 ENCOUNTER — Encounter (HOSPITAL_COMMUNITY): Payer: Self-pay

## 2015-05-01 ENCOUNTER — Emergency Department (HOSPITAL_COMMUNITY)
Admission: EM | Admit: 2015-05-01 | Discharge: 2015-05-01 | Discharge status: Against Medical Advice | Payer: Self-pay | Attending: Emergency Medicine | Admitting: Emergency Medicine

## 2015-05-01 DIAGNOSIS — K219 Gastro-esophageal reflux disease without esophagitis: Secondary | ICD-10-CM | POA: Insufficient documentation

## 2015-05-01 DIAGNOSIS — Z9889 Other specified postprocedural states: Secondary | ICD-10-CM | POA: Insufficient documentation

## 2015-05-01 DIAGNOSIS — Z79899 Other long term (current) drug therapy: Secondary | ICD-10-CM | POA: Insufficient documentation

## 2015-05-01 DIAGNOSIS — F319 Bipolar disorder, unspecified: Secondary | ICD-10-CM | POA: Insufficient documentation

## 2015-05-01 DIAGNOSIS — I10 Essential (primary) hypertension: Secondary | ICD-10-CM | POA: Insufficient documentation

## 2015-05-01 DIAGNOSIS — R1013 Epigastric pain: Secondary | ICD-10-CM | POA: Insufficient documentation

## 2015-05-01 DIAGNOSIS — M19021 Primary osteoarthritis, right elbow: Secondary | ICD-10-CM | POA: Insufficient documentation

## 2015-05-01 DIAGNOSIS — Z8639 Personal history of other endocrine, nutritional and metabolic disease: Secondary | ICD-10-CM | POA: Insufficient documentation

## 2015-05-01 DIAGNOSIS — Z8701 Personal history of pneumonia (recurrent): Secondary | ICD-10-CM | POA: Insufficient documentation

## 2015-05-01 DIAGNOSIS — M170 Bilateral primary osteoarthritis of knee: Secondary | ICD-10-CM | POA: Insufficient documentation

## 2015-05-01 DIAGNOSIS — G8929 Other chronic pain: Secondary | ICD-10-CM | POA: Insufficient documentation

## 2015-05-01 DIAGNOSIS — F419 Anxiety disorder, unspecified: Secondary | ICD-10-CM | POA: Insufficient documentation

## 2015-05-01 DIAGNOSIS — M19022 Primary osteoarthritis, left elbow: Secondary | ICD-10-CM | POA: Insufficient documentation

## 2015-05-01 DIAGNOSIS — F17210 Nicotine dependence, cigarettes, uncomplicated: Secondary | ICD-10-CM | POA: Insufficient documentation

## 2015-05-01 DIAGNOSIS — Z8781 Personal history of (healed) traumatic fracture: Secondary | ICD-10-CM | POA: Insufficient documentation

## 2015-05-01 DIAGNOSIS — R011 Cardiac murmur, unspecified: Secondary | ICD-10-CM | POA: Insufficient documentation

## 2015-05-01 DIAGNOSIS — D649 Anemia, unspecified: Secondary | ICD-10-CM | POA: Insufficient documentation

## 2015-05-01 DIAGNOSIS — J45909 Unspecified asthma, uncomplicated: Secondary | ICD-10-CM | POA: Insufficient documentation

## 2015-05-01 DIAGNOSIS — K859 Acute pancreatitis without necrosis or infection, unspecified: Secondary | ICD-10-CM | POA: Insufficient documentation

## 2015-05-01 LAB — LIPASE, BLOOD: Lipase: 56 U/L — ABNORMAL HIGH (ref 11–51)

## 2015-05-01 LAB — URINALYSIS, ROUTINE W REFLEX MICROSCOPIC
BILIRUBIN URINE: NEGATIVE
GLUCOSE, UA: NEGATIVE mg/dL
Hgb urine dipstick: NEGATIVE
Ketones, ur: NEGATIVE mg/dL
Leukocytes, UA: NEGATIVE
NITRITE: NEGATIVE
PH: 5.5 (ref 5.0–8.0)
Protein, ur: NEGATIVE mg/dL
SPECIFIC GRAVITY, URINE: 1.024 (ref 1.005–1.030)

## 2015-05-01 LAB — COMPREHENSIVE METABOLIC PANEL
ALBUMIN: 4.2 g/dL (ref 3.5–5.0)
ALK PHOS: 122 U/L (ref 38–126)
ALT: 56 U/L (ref 17–63)
ANION GAP: 12 (ref 5–15)
AST: 82 U/L — AB (ref 15–41)
BILIRUBIN TOTAL: 0.7 mg/dL (ref 0.3–1.2)
BUN: 7 mg/dL (ref 6–20)
CALCIUM: 9.1 mg/dL (ref 8.9–10.3)
CO2: 22 mmol/L (ref 22–32)
Chloride: 103 mmol/L (ref 101–111)
Creatinine, Ser: 0.8 mg/dL (ref 0.61–1.24)
GFR calc Af Amer: >60 mL/min (ref >60)
GFR calc non Af Amer: >60 mL/min (ref >60)
GLUCOSE: 85 mg/dL (ref 65–99)
Potassium: 3.9 mmol/L (ref 3.5–5.1)
SODIUM: 137 mmol/L (ref 135–145)
TOTAL PROTEIN: 7.9 g/dL (ref 6.5–8.1)

## 2015-05-01 LAB — CBC WITH DIFFERENTIAL/PLATELET
BASOS ABS: 0 10*3/uL (ref 0.0–0.1)
BASOS PCT: 0 %
EOS ABS: 0.6 10*3/uL (ref 0.0–0.7)
Eosinophils Relative: 7 %
HEMATOCRIT: 39.2 % (ref 39.0–52.0)
HEMOGLOBIN: 13 g/dL (ref 13.0–17.0)
Lymphocytes Relative: 41 %
Lymphs Abs: 3.6 10*3/uL (ref 0.7–4.0)
MCH: 31.9 pg (ref 26.0–34.0)
MCHC: 33.2 g/dL (ref 30.0–36.0)
MCV: 96.3 fL (ref 78.0–100.0)
MONOS PCT: 7 %
Monocytes Absolute: 0.6 10*3/uL (ref 0.1–1.0)
NEUTROS PCT: 45 %
Neutro Abs: 3.9 10*3/uL (ref 1.7–7.7)
Platelets: 210 10*3/uL (ref 150–400)
RBC: 4.07 MIL/uL — AB (ref 4.22–5.81)
RDW: 13.7 % (ref 11.5–15.5)
WBC: 8.7 10*3/uL (ref 4.0–10.5)

## 2015-05-01 MED ORDER — GI COCKTAIL ~~LOC~~
30.0000 mL | Freq: Once | ORAL | Status: AC
  Administered 2015-05-01: 30 mL via ORAL
  Filled 2015-05-01: qty 30

## 2015-05-01 MED ORDER — ONDANSETRON 4 MG PO TBDP
4.0000 mg | ORAL_TABLET | Freq: Once | ORAL | Status: AC
  Administered 2015-05-01: 4 mg via ORAL
  Filled 2015-05-01: qty 1

## 2015-05-01 MED ORDER — HALOPERIDOL LACTATE 5 MG/ML IJ SOLN
2.0000 mg | Freq: Once | INTRAMUSCULAR | Status: AC
  Administered 2015-05-01: 2 mg via INTRAMUSCULAR
  Filled 2015-05-01: qty 1

## 2015-05-01 NOTE — ED Notes (Signed)
PT requesting dilaudid and GI cocktail. MD informed.

## 2015-05-01 NOTE — ED Notes (Signed)
Pt given urinal to give urine specimen 

## 2015-05-01 NOTE — ED Provider Notes (Signed)
CSN: [id number]     Arrival date & time 05/01/15  0504 History   First MD Initiated Contact with Patient 05/01/15 0604     Chief Complaint  Patient presents with  . Abdominal Pain    HPI   44 year old male presents today with abdominal pain. Patient has been seen 24 times in the last 6 months or similar presentations. Patient reports that on December 19 he began having abdominal pain was seen in the emergency room treated with pain medication and discharged home. Patient reports since that time he has had continued stomach pain, with the addition of what he calls "pancreatic pain" starting prior to arrival. Patient reports that he was drinking chicken broth and mashed potatoes which started the pain in the left epigastric region. Patient reports taking 2 oxycodone, promethazine, Carafate prior to arrival without improvement in symptoms. Patient denies any alcohol or drug use, and has not tried any other over-the-counter medications. She reports both diarrhea and vomiting, denies any fever, chest pain, shortness of breath. Patient reports today's presentation is similar to previous    Past Medical History  Diagnosis Date  . Hypertension   . Asthma   . Pancreatitis   . Cocaine abuse   . Depression   . H/O suicide attempt 10/2012  . Heart murmur     "when he was little" (03/06/2013)  . Anemia   . H/O hiatal hernia   . GERD (gastroesophageal reflux disease)   . Anxiety   . WPW (Wolff-Parkinson-White syndrome)     [name] 03/06/2013  . High cholesterol   . Femoral condyle fracture ([place]) 03/08/2014    left medial/notes 03/09/2014  . Alcoholism /alcohol abuse ([place])   . Family history of adverse reaction to anesthesia     "grandmother gets confused"  . Shortness of breath     "can happen at anytime" (03/06/2013)  . Pneumonia 1990's X 3  . Chronic bronchitis ([place])   . Sickle cell trait ([employer])   . History of blood transfusion 10/2012    "when I tried to commit suicide"  . History of  stomach ulcers   . Migraine     "a few times/year" (03/26/2015)  . Arthritis     "knees; arms; elbows" (03/26/2015)  . Chronic lower back pain   . Bipolar disorder ([place])   . PTSD (post-traumatic stress disorder)    Past Surgical History  Procedure Laterality Date  . Facial fracture surgery Left 1990's    "result of trauma"   . Eye surgery Left 1990's    "result of trauma"   . Left heart catheterization with coronary angiogram Right 03/07/2013    Procedure: LEFT HEART CATHETERIZATION WITH CORONARY ANGIOGRAM;  Surgeon: [name], MD;  Location: [facility];  Service: Cardiovascular;  Laterality: Right;  . Cardiac catheterization    . Fracture surgery    . Umbilical hernia repair    . Hernia repair     Family History  Problem Relation Age of Onset  . Hypertension Other   . Coronary artery disease Other    Social History  Substance Use Topics  . Smoking status: Current Every Day Smoker -- 1.00 packs/day for 30 years    Types: Cigarettes  . Smokeless tobacco: Current User    Types: Chew  . Alcohol Use: 37.8 oz/week    63 Cans of beer per week     Comment: last drink was 04/27/15    Review of Systems  All other systems reviewed and are  negative.   Allergies  Shellfish-derived products and Diazepam  Home Medications   Prior to Admission medications   Medication Sig Start Date End Date Taking? Authorizing Provider  albuterol (PROVENTIL HFA;VENTOLIN HFA) 108 (90 BASE) MCG/ACT inhaler Inhale 2 puffs into the lungs every 6 (six) hours as needed for wheezing or shortness of breath (wheezing).    Yes Historical Provider, MD  alum & mag hydroxide-simeth (MYLANTA DOUBLE-STRENGTH) 400-400-40 MG/5ML suspension Take 5 mLs by mouth every 6 (six) hours as needed for indigestion. Patient taking differently: Take 5 mLs by mouth 2 (two) times daily.  04/09/15  Yes Courteney Lyn Mackuen, MD  folic acid (FOLVITE) 1 MG tablet Take 1 tablet (1 mg total) by mouth daily. 01/15/15  Yes  [name], MD  lipase/protease/amylase (CREON) 12000 UNITS CPEP capsule Take 2 capsules (24,000 Units total) by mouth 3 (three) times daily with meals. 01/15/15  Yes [name], MD  metoprolol tartrate (LOPRESSOR) 25 MG tablet Take 1 tablet (25 mg total) by mouth 2 (two) times daily. 01/15/15  Yes [name], MD  Multiple Vitamin (MULTIVITAMIN) capsule Take 1 capsule by mouth daily. 02/06/14  Yes [name], MD  omeprazole (PRILOSEC) 20 MG capsule Take 1 capsule (20 mg total) by mouth daily. 04/20/15  Yes [name], PA-C  oxyCODONE-acetaminophen (PERCOCET/ROXICET) 5-325 MG tablet Take 1 tablet by mouth every 4 (four) hours as needed for severe pain. 04/24/15  Yes Olivia Canter Sam, PA-C  promethazine (PHENERGAN) 25 MG tablet Take 1 tablet (25 mg total) by mouth every 6 (six) hours as needed for nausea or vomiting. 04/20/15  Yes [name], PA-C  QUEtiapine (SEROQUEL) 50 MG tablet Take 100 mg by mouth at bedtime. 01/15/15  Yes Historical Provider, MD  sertraline (ZOLOFT) 25 MG tablet Take 3 tablets (75 mg total) by mouth daily. 01/15/15  Yes [name], MD  sildenafil (VIAGRA) 50 MG tablet Take 1 tablet (50 mg total) by mouth daily as needed for erectile dysfunction. 03/31/15  Yes Olugbemiga Essie Christine, MD  sucralfate (CARAFATE) 1 G tablet Take 1 tablet (1 g total) by mouth 4 (four) times daily -  with meals and at bedtime. 03/15/15  Yes Shari Upstill, PA-C  vitamin B-12 500 MCG tablet Take 1 tablet (500 mcg total) by mouth daily. 03/30/15  Yes [name], MD   BP 127/94 mmHg  Pulse 95  Temp(Src) 98 F (36.7 C) (Oral)  Resp 16  Ht 5\' 9"  (1.753 m)  Wt 63.504 kg  BMI 20.67 kg/m2  SpO2 100%   Physical Exam  Constitutional: He is oriented to person, place, and time. He appears well-developed and well-nourished. No distress.  HENT:  Head: Normocephalic and atraumatic.  Eyes: Conjunctivae are normal. Pupils are equal, round, and reactive to light.  Right eye exhibits no discharge. Left eye exhibits no discharge. No scleral icterus.  Neck: Normal range of motion. No JVD present. No tracheal deviation present.  Pulmonary/Chest: Effort normal and breath sounds normal. No stridor. No respiratory distress. He has no wheezes. He has no rales. He exhibits no tenderness.  Abdominal: Soft. Bowel sounds are normal. He exhibits no distension and no mass. There is tenderness. There is no rebound and no guarding.  Epigastric ttp  Musculoskeletal: Normal range of motion. He exhibits no edema or tenderness.  Neurological: He is alert and oriented to person, place, and time. Coordination normal.  Skin: He is not diaphoretic.  Psychiatric: He has a normal mood and affect. His behavior  is normal. Judgment and thought content normal.  Nursing note and vitals reviewed.   ED Course  Procedures (including critical care time) Labs Review Labs Reviewed  CBC WITH DIFFERENTIAL/PLATELET - Abnormal; Notable for the following:    RBC 4.07 (*)    All other components within normal limits  COMPREHENSIVE METABOLIC PANEL - Abnormal; Notable for the following:    AST 82 (*)    All other components within normal limits  LIPASE, BLOOD - Abnormal; Notable for the following:    Lipase 56 (*)    All other components within normal limits  URINALYSIS, ROUTINE W REFLEX MICROSCOPIC (NOT AT [facility])    Imaging Review No results found. I have personally reviewed and evaluated these images and lab results as part of my medical decision-making.   EKG Interpretation None      MDM   Final diagnoses:  Epigastric pain    Labs: Urinalysis, CBC, CMP- lipase of 56  Imaging:  Consults:  Therapeutics: Haldol, GI cocktail and Zofran  Discharge Meds:   Assessment/Plan: 44 year old male with a history of chronic abdominal pain presents today with abdominal pain. Patient reports symptoms are typical of his "pancreatic" abdominal pain. Patient denies any drug or alcohol  use, reports started after drinking broth. Due to patient's chronic nature of abdominal pain that decision to start with Haldol and anti-emetics resulted in no subjective improvement in pain level. Patient was given a GI cocktail, he requested IV Dilaudid. I reported that we would try the GI cocktail and move forward from there. Shortly after I was notified that patient was no longer in his room. Patient appeared to be in no acute distress, he showed no signs of vomiting or diarrhea while here in the ED. Today's abdominal pain unlikely to be acute in nature. Patient left prior to me being able to reassess him.         Okey Regal, PA-C 05/01/15 [id number]  Everlene Balls, MD 05/01/15 [phone]

## 2015-05-01 NOTE — ED Notes (Signed)
N/V/D x 4 days. Seen here on the 19th of this month and was told to stop drinking alcohol. Pt woke up at 0100 with mid abd pain. Pt admits to vomiting twice today. NAD upon arrival. BP 146 palpated, Pulse 92, CBG 104.

## 2015-05-01 NOTE — ED Notes (Signed)
PT not in room at this time.

## 2015-05-01 NOTE — ED Notes (Signed)
Patient still not in room, gown and all monitoring equipment on stretcher, no pt belongings present in the room. PA Hedges made aware.

## 2015-05-12 ENCOUNTER — Emergency Department (HOSPITAL_COMMUNITY)
Admission: EM | Admit: 2015-05-12 | Discharge: 2015-05-12 | Disposition: A | Discharge status: Home / Self Care | Payer: Self-pay | Attending: Emergency Medicine | Admitting: Emergency Medicine

## 2015-05-12 ENCOUNTER — Encounter (HOSPITAL_COMMUNITY): Payer: Self-pay | Admitting: Emergency Medicine

## 2015-05-12 DIAGNOSIS — F4310 Post-traumatic stress disorder, unspecified: Secondary | ICD-10-CM | POA: Insufficient documentation

## 2015-05-12 DIAGNOSIS — D649 Anemia, unspecified: Secondary | ICD-10-CM | POA: Insufficient documentation

## 2015-05-12 DIAGNOSIS — G8929 Other chronic pain: Secondary | ICD-10-CM | POA: Insufficient documentation

## 2015-05-12 DIAGNOSIS — K219 Gastro-esophageal reflux disease without esophagitis: Secondary | ICD-10-CM | POA: Insufficient documentation

## 2015-05-12 DIAGNOSIS — F319 Bipolar disorder, unspecified: Secondary | ICD-10-CM | POA: Insufficient documentation

## 2015-05-12 DIAGNOSIS — F419 Anxiety disorder, unspecified: Secondary | ICD-10-CM | POA: Insufficient documentation

## 2015-05-12 DIAGNOSIS — Z8701 Personal history of pneumonia (recurrent): Secondary | ICD-10-CM | POA: Insufficient documentation

## 2015-05-12 DIAGNOSIS — E7800 Pure hypercholesterolemia, unspecified: Secondary | ICD-10-CM | POA: Insufficient documentation

## 2015-05-12 DIAGNOSIS — Z8781 Personal history of (healed) traumatic fracture: Secondary | ICD-10-CM | POA: Insufficient documentation

## 2015-05-12 DIAGNOSIS — J45909 Unspecified asthma, uncomplicated: Secondary | ICD-10-CM | POA: Insufficient documentation

## 2015-05-12 DIAGNOSIS — Z915 Personal history of self-harm: Secondary | ICD-10-CM | POA: Insufficient documentation

## 2015-05-12 DIAGNOSIS — R1012 Left upper quadrant pain: Secondary | ICD-10-CM | POA: Insufficient documentation

## 2015-05-12 DIAGNOSIS — R1110 Vomiting, unspecified: Secondary | ICD-10-CM | POA: Insufficient documentation

## 2015-05-12 DIAGNOSIS — Z9889 Other specified postprocedural states: Secondary | ICD-10-CM | POA: Insufficient documentation

## 2015-05-12 DIAGNOSIS — M19022 Primary osteoarthritis, left elbow: Secondary | ICD-10-CM | POA: Insufficient documentation

## 2015-05-12 DIAGNOSIS — M170 Bilateral primary osteoarthritis of knee: Secondary | ICD-10-CM | POA: Insufficient documentation

## 2015-05-12 DIAGNOSIS — R1013 Epigastric pain: Secondary | ICD-10-CM | POA: Insufficient documentation

## 2015-05-12 DIAGNOSIS — R109 Unspecified abdominal pain: Secondary | ICD-10-CM

## 2015-05-12 DIAGNOSIS — M19021 Primary osteoarthritis, right elbow: Secondary | ICD-10-CM | POA: Insufficient documentation

## 2015-05-12 DIAGNOSIS — Z79899 Other long term (current) drug therapy: Secondary | ICD-10-CM | POA: Insufficient documentation

## 2015-05-12 DIAGNOSIS — R197 Diarrhea, unspecified: Secondary | ICD-10-CM | POA: Insufficient documentation

## 2015-05-12 DIAGNOSIS — I10 Essential (primary) hypertension: Secondary | ICD-10-CM | POA: Insufficient documentation

## 2015-05-12 DIAGNOSIS — R011 Cardiac murmur, unspecified: Secondary | ICD-10-CM | POA: Insufficient documentation

## 2015-05-12 LAB — COMPREHENSIVE METABOLIC PANEL
ALBUMIN: 4.4 g/dL (ref 3.5–5.0)
ALT: 55 U/L (ref 17–63)
AST: 54 U/L — AB (ref 15–41)
Alkaline Phosphatase: 116 U/L (ref 38–126)
Anion gap: 10 (ref 5–15)
BILIRUBIN TOTAL: 0.5 mg/dL (ref 0.3–1.2)
BUN: 11 mg/dL (ref 6–20)
CO2: 27 mmol/L (ref 22–32)
Calcium: 9.4 mg/dL (ref 8.9–10.3)
Chloride: 106 mmol/L (ref 101–111)
Creatinine, Ser: 0.9 mg/dL (ref 0.61–1.24)
GFR calc Af Amer: >60 mL/min (ref >60)
GFR calc non Af Amer: >60 mL/min (ref >60)
GLUCOSE: 100 mg/dL — AB (ref 65–99)
POTASSIUM: 3.8 mmol/L (ref 3.5–5.1)
Sodium: 143 mmol/L (ref 135–145)
TOTAL PROTEIN: 8.2 g/dL — AB (ref 6.5–8.1)

## 2015-05-12 LAB — URINALYSIS, ROUTINE W REFLEX MICROSCOPIC
BILIRUBIN URINE: NEGATIVE
GLUCOSE, UA: NEGATIVE mg/dL
HGB URINE DIPSTICK: NEGATIVE
KETONES UR: NEGATIVE mg/dL
Leukocytes, UA: NEGATIVE
NITRITE: NEGATIVE
PH: 6.5 (ref 5.0–8.0)
Protein, ur: NEGATIVE mg/dL
Specific Gravity, Urine: 1.024 (ref 1.005–1.030)

## 2015-05-12 LAB — CBC
HEMATOCRIT: 39.9 % (ref 39.0–52.0)
Hemoglobin: 13.2 g/dL (ref 13.0–17.0)
MCH: 32.2 pg (ref 26.0–34.0)
MCHC: 33.1 g/dL (ref 30.0–36.0)
MCV: 97.3 fL (ref 78.0–100.0)
Platelets: 246 10*3/uL (ref 150–400)
RBC: 4.1 MIL/uL — ABNORMAL LOW (ref 4.22–5.81)
RDW: 13.9 % (ref 11.5–15.5)
WBC: 8.4 10*3/uL (ref 4.0–10.5)

## 2015-05-12 LAB — LIPASE, BLOOD: Lipase: 31 U/L (ref 11–51)

## 2015-05-12 MED ORDER — PROMETHAZINE HCL 25 MG/ML IJ SOLN
25.0000 mg | Freq: Once | INTRAMUSCULAR | Status: AC
  Administered 2015-05-12: 25 mg via INTRAVENOUS
  Filled 2015-05-12: qty 1

## 2015-05-12 MED ORDER — OXYCODONE-ACETAMINOPHEN 5-325 MG PO TABS
1.0000 | ORAL_TABLET | Freq: Once | ORAL | Status: AC
  Administered 2015-05-12: 1 via ORAL
  Filled 2015-05-12: qty 1

## 2015-05-12 MED ORDER — HYDROMORPHONE HCL 1 MG/ML IJ SOLN: 1.0000 mg | Freq: Once | INTRAMUSCULAR | Status: DC

## 2015-05-12 MED ORDER — GI COCKTAIL ~~LOC~~
30.0000 mL | Freq: Once | ORAL | Status: AC
  Administered 2015-05-12: 30 mL via ORAL
  Filled 2015-05-12: qty 30

## 2015-05-12 MED ORDER — HALOPERIDOL LACTATE 5 MG/ML IJ SOLN
2.0000 mg | Freq: Once | INTRAMUSCULAR | Status: DC
  Filled 2015-05-12: qty 1

## 2015-05-12 MED ORDER — SODIUM CHLORIDE 0.9 % IV BOLUS (SEPSIS)
1000.0000 mL | Freq: Once | INTRAVENOUS | Status: AC
  Administered 2015-05-12: 1000 mL via INTRAVENOUS

## 2015-05-12 MED ORDER — HYDROMORPHONE HCL 1 MG/ML IJ SOLN
1.0000 mg | Freq: Once | INTRAMUSCULAR | Status: AC
  Administered 2015-05-12: 1 mg via INTRAVENOUS
  Filled 2015-05-12: qty 1

## 2015-05-12 MED ORDER — GI COCKTAIL ~~LOC~~: 30.0000 mL | Freq: Two times a day (BID) | ORAL | Status: DC | PRN

## 2015-05-12 NOTE — ED Provider Notes (Signed)
CSN: [id number]     Arrival date & time 05/12/15  1442 History   First MD Initiated Contact with Patient 05/12/15 1711     No chief complaint on file.   HPI  [name] is an 45 y.o. male with history of HTN, pancreatitis, alcohol/polysubstance abuse who presents to the ED for evaluation of epigastric and LUQ abdominal pain. He is very well known to this ED and to this writer for his frequent ED visits for abdominal pain and n/v. Pt states he had sudden onset epigastrc/LUQ pain this morning after breakfast. He endorses 2 episodes of NBNB emesis and one episode of watery diarrhea. He states that he took his carafate at home with no relief. Pt reports to me he ran out of his Percocet at home two days ago but apparently reported to triage nurse he took Percocet at home. Pt states he has PCP f/u this week and is in the process of getting his orange card to schedule GI f/u. He states last EtOH use was on 05/09/15. Last MJ use yesterday and reports he continues to use marijuana daily. Denies other drugs. Denies fever, chills, chest pain, SOB.  Past Medical History  Diagnosis Date  . Hypertension   . Asthma   . Pancreatitis   . Cocaine abuse   . Depression   . H/O suicide attempt 10/2012  . Heart murmur     "when he was little" (03/06/2013)  . Anemia   . H/O hiatal hernia   . GERD (gastroesophageal reflux disease)   . Anxiety   . WPW (Wolff-Parkinson-White syndrome)     [name] 03/06/2013  . High cholesterol   . Femoral condyle fracture ([place]) 03/08/2014    left medial/notes 03/09/2014  . Alcoholism /alcohol abuse ([place])   . Family history of adverse reaction to anesthesia     "grandmother gets confused"  . Shortness of breath     "can happen at anytime" (03/06/2013)  . Pneumonia 1990's X 3  . Chronic bronchitis ([place])   . Sickle cell trait ([place])   . History of blood transfusion 10/2012    "when I tried to commit suicide"  . History of stomach ulcers   . Migraine     "a few times/year"  (03/26/2015)  . Arthritis     "knees; arms; elbows" (03/26/2015)  . Chronic lower back pain   . Bipolar disorder ([place])   . PTSD (post-traumatic stress disorder)    Past Surgical History  Procedure Laterality Date  . Facial fracture surgery Left 1990's    "result of trauma"   . Eye surgery Left 1990's    "result of trauma"   . Left heart catheterization with coronary angiogram Right 03/07/2013    Procedure: LEFT HEART CATHETERIZATION WITH CORONARY ANGIOGRAM;  Surgeon: [name], MD;  Location: [facility];  Service: Cardiovascular;  Laterality: Right;  . Cardiac catheterization    . Fracture surgery    . Umbilical hernia repair    . Hernia repair     Family History  Problem Relation Age of Onset  . Hypertension Other   . Coronary artery disease Other    Social History  Substance Use Topics  . Smoking status: Current Every Day Smoker -- 1.00 packs/day for 30 years    Types: Cigarettes  . Smokeless tobacco: Current User    Types: Chew  . Alcohol Use: 37.8 oz/week    63 Cans of beer per week     Comment: last drink was  04/27/15    Review of Systems  All other systems reviewed and are negative.     Allergies  Shellfish-derived products and Diazepam  Home Medications   Prior to Admission medications   Medication Sig Start Date End Date Taking? Authorizing Provider  albuterol (PROVENTIL HFA;VENTOLIN HFA) 108 (90 BASE) MCG/ACT inhaler Inhale 2 puffs into the lungs every 6 (six) hours as needed for wheezing or shortness of breath (wheezing).    Yes Historical Provider, MD  alum & mag hydroxide-simeth (MYLANTA DOUBLE-STRENGTH) 400-400-40 MG/5ML suspension Take 5 mLs by mouth every 6 (six) hours as needed for indigestion. Patient taking differently: Take 5 mLs by mouth 2 (two) times daily.  04/09/15  Yes Courteney Lyn Mackuen, MD  folic acid (FOLVITE) 1 MG tablet Take 1 tablet (1 mg total) by mouth daily. 01/15/15  Yes [name], MD  lipase/protease/amylase  (CREON) 12000 UNITS CPEP capsule Take 2 capsules (24,000 Units total) by mouth 3 (three) times daily with meals. 01/15/15  Yes [name], MD  metoprolol tartrate (LOPRESSOR) 25 MG tablet Take 1 tablet (25 mg total) by mouth 2 (two) times daily. 01/15/15  Yes [name], MD  Multiple Vitamin (MULTIVITAMIN) capsule Take 1 capsule by mouth daily. 02/06/14  Yes [name], MD  omeprazole (PRILOSEC) 20 MG capsule Take 1 capsule (20 mg total) by mouth daily. 04/20/15  Yes [name], PA-C  oxyCODONE-acetaminophen (PERCOCET/ROXICET) 5-325 MG tablet Take 1 tablet by mouth every 4 (four) hours as needed for severe pain. 04/24/15  Yes Olivia Canter Shamanda Len, PA-C  promethazine (PHENERGAN) 25 MG tablet Take 1 tablet (25 mg total) by mouth every 6 (six) hours as needed for nausea or vomiting. 04/20/15  Yes [name], PA-C  QUEtiapine (SEROQUEL) 50 MG tablet Take 100 mg by mouth at bedtime. 01/15/15  Yes Historical Provider, MD  sertraline (ZOLOFT) 25 MG tablet Take 3 tablets (75 mg total) by mouth daily. 01/15/15  Yes [name], MD  sildenafil (VIAGRA) 50 MG tablet Take 1 tablet (50 mg total) by mouth daily as needed for erectile dysfunction. 03/31/15  Yes Olugbemiga Essie Christine, MD  sucralfate (CARAFATE) 1 G tablet Take 1 tablet (1 g total) by mouth 4 (four) times daily -  with meals and at bedtime. 03/15/15  Yes Shari Upstill, PA-C  vitamin B-12 500 MCG tablet Take 1 tablet (500 mcg total) by mouth daily. 03/30/15  Yes [name], MD  Alum & Mag Hydroxide-Simeth (GI COCKTAIL) SUSP suspension Take 30 mLs by mouth 2 (two) times daily as needed. Shake well. 05/12/15   Olivia Canter Zelene Barga, PA-C   BP 159/105 mmHg  Pulse 88  Temp(Src) 98.1 F (36.7 C) (Oral)  Resp 16  SpO2 97% Physical Exam  Constitutional: He is oriented to person, place, and time. No distress.  HENT:  Right Ear: External ear normal.  Left Ear: External ear normal.  Nose: Nose normal.  Mouth/Throat: Oropharynx is  clear and moist.  Eyes: Conjunctivae and EOM are normal. Pupils are equal, round, and reactive to light.  Neck: Normal range of motion. Neck supple.  Cardiovascular: Normal rate, regular rhythm, normal heart sounds and intact distal pulses.   Pulmonary/Chest: Effort normal and breath sounds normal. No respiratory distress. He exhibits no tenderness.  Abdominal: Soft. Bowel sounds are normal. He exhibits no distension. There is no tenderness. There is no rebound and no guarding.  Musculoskeletal: He exhibits no edema.  Neurological: He is alert and oriented to person, place, and time.  No cranial nerve deficit.  Skin: Skin is warm and dry. He is not diaphoretic.  Psychiatric: He has a normal mood and affect.  Nursing note and vitals reviewed.   ED Course  Procedures (including critical care time) Labs Review Labs Reviewed  COMPREHENSIVE METABOLIC PANEL - Abnormal; Notable for the following:    Glucose, Bld 100 (*)    Total Protein 8.2 (*)    AST 54 (*)    All other components within normal limits  CBC - Abnormal; Notable for the following:    RBC 4.10 (*)    All other components within normal limits  LIPASE, BLOOD  URINALYSIS, ROUTINE W REFLEX MICROSCOPIC (NOT AT [facility])    Imaging Review No results found. I have personally reviewed and evaluated these images and lab results as part of my medical decision-making.   EKG Interpretation None      MDM   Final diagnoses:  Chronic abdominal pain    Pt is an 45 y.o. male with chronic abdominal pain. His labs are unremarkable today. He has no abdominal tenderness on my exam. He is afebrile. He is not tachycardic. I discussed with pt that I can give him one dose of IV pain medicine but he will have to f/u with PCP. He is in agreement. Will give 1L NS bolus, phenergan, dilaudid, and GI cocktail.  Pt reports improvement in pain. He is requesting dose of his home med since he is out at home. I told him that I can give him one percocet  here but will not write him a new prescription and he needs to f/u with PCP. Encouraged talking to PCP about referral to pain management clinic. Will give rx for GI cocktail. Strongly encouraged complete alcohol and marijuana cessation. Discussed with pt cyclical vomiting syndrome 2/2 MJ use.     Anne Ng, PA-C 05/12/15 2016  Leonard Schwartz, MD 05/17/15 [phone]

## 2015-05-12 NOTE — ED Notes (Signed)
Pt states that he took his HTN meds today but probably vomited it back up.

## 2015-05-12 NOTE — Discharge Instructions (Signed)
Your labs today were normal. I will give you a prescription for the GI cocktail. Please follow up with NP [name] as scheduled. Again, I highly encourage you to stop all marijuana and alcohol use. Please follow up with GI when you get your orange card. Take your home medications as prescribed.

## 2015-05-12 NOTE — ED Notes (Signed)
Hx of pancreatitis, per EMS. After eating breakfast had sharp upper left quadrant abdominal pain with 1 episode of vomiting and 2 episodes of diarrhea. Took prescribed medications for pancreatitis, including 1 Oxycodone, with no relief this afternoon.

## 2015-05-17 ENCOUNTER — Emergency Department (HOSPITAL_COMMUNITY): Payer: Self-pay

## 2015-05-17 ENCOUNTER — Encounter (HOSPITAL_COMMUNITY): Payer: Self-pay | Admitting: Oncology

## 2015-05-17 ENCOUNTER — Inpatient Hospital Stay (HOSPITAL_COMMUNITY)
Admission: EM | Admit: 2015-05-17 | Discharge: 2015-05-25 | DRG: 438 | Disposition: A | Discharge status: Home / Self Care | Payer: Self-pay | Attending: Internal Medicine | Admitting: Internal Medicine

## 2015-05-17 DIAGNOSIS — K852 Alcohol induced acute pancreatitis without necrosis or infection: Principal | ICD-10-CM | POA: Diagnosis present

## 2015-05-17 DIAGNOSIS — M1990 Unspecified osteoarthritis, unspecified site: Secondary | ICD-10-CM | POA: Diagnosis present

## 2015-05-17 DIAGNOSIS — K861 Other chronic pancreatitis: Secondary | ICD-10-CM | POA: Diagnosis present

## 2015-05-17 DIAGNOSIS — R112 Nausea with vomiting, unspecified: Secondary | ICD-10-CM

## 2015-05-17 DIAGNOSIS — F1020 Alcohol dependence, uncomplicated: Secondary | ICD-10-CM | POA: Diagnosis present

## 2015-05-17 DIAGNOSIS — K219 Gastro-esophageal reflux disease without esophagitis: Secondary | ICD-10-CM | POA: Diagnosis present

## 2015-05-17 DIAGNOSIS — K863 Pseudocyst of pancreas: Secondary | ICD-10-CM | POA: Diagnosis present

## 2015-05-17 DIAGNOSIS — F1910 Other psychoactive substance abuse, uncomplicated: Secondary | ICD-10-CM

## 2015-05-17 DIAGNOSIS — D573 Sickle-cell trait: Secondary | ICD-10-CM | POA: Diagnosis present

## 2015-05-17 DIAGNOSIS — F4310 Post-traumatic stress disorder, unspecified: Secondary | ICD-10-CM | POA: Diagnosis present

## 2015-05-17 DIAGNOSIS — K860 Alcohol-induced chronic pancreatitis: Secondary | ICD-10-CM

## 2015-05-17 DIAGNOSIS — Z8249 Family history of ischemic heart disease and other diseases of the circulatory system: Secondary | ICD-10-CM

## 2015-05-17 DIAGNOSIS — Y95 Nosocomial condition: Secondary | ICD-10-CM

## 2015-05-17 DIAGNOSIS — D72829 Elevated white blood cell count, unspecified: Secondary | ICD-10-CM | POA: Insufficient documentation

## 2015-05-17 DIAGNOSIS — F1210 Cannabis abuse, uncomplicated: Secondary | ICD-10-CM | POA: Diagnosis present

## 2015-05-17 DIAGNOSIS — R05 Cough: Secondary | ICD-10-CM

## 2015-05-17 DIAGNOSIS — Z23 Encounter for immunization: Secondary | ICD-10-CM

## 2015-05-17 DIAGNOSIS — Z8701 Personal history of pneumonia (recurrent): Secondary | ICD-10-CM

## 2015-05-17 DIAGNOSIS — E7800 Pure hypercholesterolemia, unspecified: Secondary | ICD-10-CM | POA: Diagnosis present

## 2015-05-17 DIAGNOSIS — F17210 Nicotine dependence, cigarettes, uncomplicated: Secondary | ICD-10-CM | POA: Diagnosis present

## 2015-05-17 DIAGNOSIS — Z8719 Personal history of other diseases of the digestive system: Secondary | ICD-10-CM

## 2015-05-17 DIAGNOSIS — F319 Bipolar disorder, unspecified: Secondary | ICD-10-CM | POA: Diagnosis present

## 2015-05-17 DIAGNOSIS — Z720 Tobacco use: Secondary | ICD-10-CM | POA: Diagnosis present

## 2015-05-17 DIAGNOSIS — F17200 Nicotine dependence, unspecified, uncomplicated: Secondary | ICD-10-CM | POA: Diagnosis present

## 2015-05-17 DIAGNOSIS — J189 Pneumonia, unspecified organism: Secondary | ICD-10-CM | POA: Diagnosis not present

## 2015-05-17 DIAGNOSIS — T4275XA Adverse effect of unspecified antiepileptic and sedative-hypnotic drugs, initial encounter: Secondary | ICD-10-CM | POA: Diagnosis present

## 2015-05-17 DIAGNOSIS — R42 Dizziness and giddiness: Secondary | ICD-10-CM | POA: Diagnosis present

## 2015-05-17 DIAGNOSIS — I10 Essential (primary) hypertension: Secondary | ICD-10-CM | POA: Diagnosis present

## 2015-05-17 DIAGNOSIS — R059 Cough, unspecified: Secondary | ICD-10-CM

## 2015-05-17 DIAGNOSIS — J45909 Unspecified asthma, uncomplicated: Secondary | ICD-10-CM | POA: Diagnosis present

## 2015-05-17 DIAGNOSIS — F1410 Cocaine abuse, uncomplicated: Secondary | ICD-10-CM | POA: Diagnosis present

## 2015-05-17 DIAGNOSIS — K0889 Other specified disorders of teeth and supporting structures: Secondary | ICD-10-CM

## 2015-05-17 LAB — COMPREHENSIVE METABOLIC PANEL
ALK PHOS: 114 U/L (ref 38–126)
ALT: 39 U/L (ref 17–63)
AST: 43 U/L — AB (ref 15–41)
Albumin: 3.8 g/dL (ref 3.5–5.0)
Anion gap: 9 (ref 5–15)
BILIRUBIN TOTAL: 0.5 mg/dL (ref 0.3–1.2)
BUN: 7 mg/dL (ref 6–20)
CALCIUM: 9 mg/dL (ref 8.9–10.3)
CHLORIDE: 107 mmol/L (ref 101–111)
CO2: 25 mmol/L (ref 22–32)
CREATININE: 0.63 mg/dL (ref 0.61–1.24)
GFR calc Af Amer: >60 mL/min (ref >60)
Glucose, Bld: 111 mg/dL — ABNORMAL HIGH (ref 65–99)
Potassium: 3.7 mmol/L (ref 3.5–5.1)
Sodium: 141 mmol/L (ref 135–145)
TOTAL PROTEIN: 7.5 g/dL (ref 6.5–8.1)

## 2015-05-17 LAB — URINALYSIS, ROUTINE W REFLEX MICROSCOPIC
BILIRUBIN URINE: NEGATIVE
GLUCOSE, UA: NEGATIVE mg/dL
HGB URINE DIPSTICK: NEGATIVE
Ketones, ur: NEGATIVE mg/dL
Leukocytes, UA: NEGATIVE
Nitrite: NEGATIVE
PROTEIN: NEGATIVE mg/dL
Specific Gravity, Urine: 1.013 (ref 1.005–1.030)
pH: 6 (ref 5.0–8.0)

## 2015-05-17 LAB — CBC WITH DIFFERENTIAL/PLATELET
BASOS ABS: 0 10*3/uL (ref 0.0–0.1)
Basophils Relative: 0 %
Eosinophils Absolute: 0.5 10*3/uL (ref 0.0–0.7)
Eosinophils Relative: 6 %
HEMATOCRIT: 35.3 % — AB (ref 39.0–52.0)
HEMOGLOBIN: 11.6 g/dL — AB (ref 13.0–17.0)
LYMPHS ABS: 3.5 10*3/uL (ref 0.7–4.0)
LYMPHS PCT: 40 %
MCH: 31.7 pg (ref 26.0–34.0)
MCHC: 32.9 g/dL (ref 30.0–36.0)
MCV: 96.4 fL (ref 78.0–100.0)
Monocytes Absolute: 0.7 10*3/uL (ref 0.1–1.0)
Monocytes Relative: 9 %
NEUTROS ABS: 4 10*3/uL (ref 1.7–7.7)
Neutrophils Relative %: 45 %
Platelets: 200 10*3/uL (ref 150–400)
RBC: 3.66 MIL/uL — AB (ref 4.22–5.81)
RDW: 13.3 % (ref 11.5–15.5)
WBC: 8.7 10*3/uL (ref 4.0–10.5)

## 2015-05-17 LAB — LIPASE, BLOOD: LIPASE: 69 U/L — AB (ref 11–51)

## 2015-05-17 MED ORDER — SODIUM CHLORIDE 0.9 % IV BOLUS (SEPSIS)
1000.0000 mL | Freq: Once | INTRAVENOUS | Status: AC
  Administered 2015-05-17: 1000 mL via INTRAVENOUS

## 2015-05-17 MED ORDER — MORPHINE SULFATE (PF) 4 MG/ML IV SOLN
4.0000 mg | Freq: Once | INTRAVENOUS | Status: AC
Start: 2015-05-17 — End: 2015-05-17
  Administered 2015-05-17: 4 mg via INTRAVENOUS
  Filled 2015-05-17: qty 1

## 2015-05-17 MED ORDER — GI COCKTAIL ~~LOC~~
30.0000 mL | Freq: Once | ORAL | Status: AC
  Administered 2015-05-18: 30 mL via ORAL
  Filled 2015-05-17: qty 30

## 2015-05-17 MED ORDER — IOHEXOL 300 MG/ML  SOLN: 50.0000 mL | Freq: Once | INTRAMUSCULAR | Status: DC | PRN

## 2015-05-17 MED ORDER — ONDANSETRON HCL 4 MG/2ML IJ SOLN
4.0000 mg | Freq: Once | INTRAMUSCULAR | Status: AC
  Administered 2015-05-17: 4 mg via INTRAVENOUS
  Filled 2015-05-17: qty 2

## 2015-05-17 NOTE — ED Provider Notes (Signed)
CSN: [id number]     Arrival date & time 05/17/15  1921 History   First MD Initiated Contact with Patient 05/17/15 2236     Chief Complaint  Patient presents with  . Abdominal Pain     (Consider location/radiation/quality/duration/timing/severity/associated sxs/prior Treatment) HPI   [name] is a 45 y.o. male, with a history of alcoholic pancreatitis, hypertension, GERD, WPW, and peptic ulcer disease, presenting to the ED with epigastric pain. Pt states that he ate mashed potatoes and gravy as well as salisbury steak at around 3:30 pm today and his pain began within 10 minutes after this. Pt states he thinks that this food may have caused him to have a flare up of his pancreatitis. Pt also endorses vomiting three times since the pain began as well as some diarrhea with 6 BMs since the pain began. Pt rates his pain at 9/10, sharp/stabbing, radiates to the RUQ. Patient has not taken anything for his pain. Patient states that he does have some occasional red tinted stools with the last occurrence 3 days ago. Pt denies melena, hematemesis, chest pain, shortness of breath, or any other complaints. Patient states that it is been recommended that he be evaluated by GI for his frequent flareups of pancreatitis, but he states that he has not been able to afford a visit to the GI specialist. Pt denies previous abdominal surgeries. Patient adds that he drinks about two 24 ounce beers a day. Patient drank this amount of alcohol last night as well as used cocaine and marijuana.   Past Medical History  Diagnosis Date  . Hypertension   . Asthma   . Pancreatitis   . Cocaine abuse   . Depression   . H/O suicide attempt 10/2012  . Heart murmur     "when he was little" (03/06/2013)  . Anemia   . H/O hiatal hernia   . GERD (gastroesophageal reflux disease)   . Anxiety   . WPW (Wolff-Parkinson-White syndrome)     [name] 03/06/2013  . High cholesterol   . Femoral condyle fracture ([place]) 03/08/2014     left medial/notes 03/09/2014  . Alcoholism /alcohol abuse ([place])   . Family history of adverse reaction to anesthesia     "grandmother gets confused"  . Shortness of breath     "can happen at anytime" (03/06/2013)  . Pneumonia 1990's X 3  . Chronic bronchitis ([place])   . Sickle cell trait ([place])   . History of blood transfusion 10/2012    "when I tried to commit suicide"  . History of stomach ulcers   . Migraine     "a few times/year" (03/26/2015)  . Arthritis     "knees; arms; elbows" (03/26/2015)  . Chronic lower back pain   . Bipolar disorder ([place])   . PTSD (post-traumatic stress disorder)    Past Surgical History  Procedure Laterality Date  . Facial fracture surgery Left 1990's    "result of trauma"   . Eye surgery Left 1990's    "result of trauma"   . Left heart catheterization with coronary angiogram Right 03/07/2013    Procedure: LEFT HEART CATHETERIZATION WITH CORONARY ANGIOGRAM;  Surgeon: [name], MD;  Location: [facility];  Service: Cardiovascular;  Laterality: Right;  . Cardiac catheterization    . Fracture surgery    . Umbilical hernia repair    . Hernia repair     Family History  Problem Relation Age of Onset  . Hypertension Other   . Coronary  artery disease Other    Social History  Substance Use Topics  . Smoking status: Current Every Day Smoker -- 1.00 packs/day for 30 years    Types: Cigarettes  . Smokeless tobacco: Current User    Types: Chew  . Alcohol Use: 37.8 oz/week    63 Cans of beer per week     Comment: last drink was 04/27/15    Review of Systems  Constitutional: Negative for fever, chills and diaphoresis.  Respiratory: Negative for shortness of breath.   Cardiovascular: Negative for chest pain.  Gastrointestinal: Positive for nausea, vomiting, abdominal pain and diarrhea. Negative for blood in stool.  Genitourinary: Negative for dysuria and testicular pain.  Musculoskeletal: Negative for back pain.  Skin: Negative for color change  and pallor.  All other systems reviewed and are negative.     Allergies  Shellfish-derived products; Trazodone and nefazodone; and Diazepam  Home Medications   Prior to Admission medications   Medication Sig Start Date End Date Taking? Authorizing Provider  albuterol (PROVENTIL HFA;VENTOLIN HFA) 108 (90 BASE) MCG/ACT inhaler Inhale 2 puffs into the lungs every 6 (six) hours as needed for wheezing or shortness of breath (wheezing).    Yes Historical Provider, MD  amitriptyline (ELAVIL) 25 MG tablet Take 25 mg by mouth at bedtime.   Yes Historical Provider, MD  folic acid (FOLVITE) 1 MG tablet Take 1 tablet (1 mg total) by mouth daily. 01/15/15  Yes [name], MD  lipase/protease/amylase (CREON) 12000 UNITS CPEP capsule Take 2 capsules (24,000 Units total) by mouth 3 (three) times daily with meals. 01/15/15  Yes [name], MD  metoprolol tartrate (LOPRESSOR) 25 MG tablet Take 1 tablet (25 mg total) by mouth 2 (two) times daily. 01/15/15  Yes [name], MD  Multiple Vitamin (MULTIVITAMIN) capsule Take 1 capsule by mouth daily. 02/06/14  Yes [name], MD  omeprazole (PRILOSEC) 20 MG capsule Take 1 capsule (20 mg total) by mouth daily. 04/20/15  Yes [name], PA-C  oxyCODONE-acetaminophen (PERCOCET/ROXICET) 5-325 MG tablet Take 1 tablet by mouth every 4 (four) hours as needed for severe pain. 04/24/15  Yes Olivia Canter Sam, PA-C  promethazine (PHENERGAN) 25 MG tablet Take 1 tablet (25 mg total) by mouth every 6 (six) hours as needed for nausea or vomiting. 04/20/15  Yes [name], PA-C  QUEtiapine (SEROQUEL) 50 MG tablet Take 100 mg by mouth at bedtime. 01/15/15  Yes Historical Provider, MD  sildenafil (VIAGRA) 50 MG tablet Take 1 tablet (50 mg total) by mouth daily as needed for erectile dysfunction. 03/31/15  Yes Olugbemiga Essie Christine, MD  sucralfate (CARAFATE) 1 G tablet Take 1 tablet (1 g total) by mouth 4 (four) times daily -  with meals and at  bedtime. 03/15/15  Yes Shari Upstill, PA-C  vitamin B-12 500 MCG tablet Take 1 tablet (500 mcg total) by mouth daily. 03/30/15  Yes [name], MD  ondansetron (ZOFRAN ODT) 4 MG disintegrating tablet Take 1 tablet (4 mg total) by mouth every 8 (eight) hours as needed for nausea or vomiting. 05/18/15   Shawn C Joy, PA-C   BP 157/107 mmHg  Pulse 92  Temp(Src) 97.6 F (36.4 C) (Oral)  Resp 18  Ht 5\' 8"  (1.727 m)  Wt 66.679 kg  BMI 22.36 kg/m2  SpO2 100% Physical Exam  Constitutional: He appears well-developed and well-nourished. No distress.  HENT:  Head: Normocephalic and atraumatic.  Eyes: Conjunctivae are normal. Pupils are equal, round, and reactive to light.  Cardiovascular: Normal rate, regular rhythm and normal heart sounds.   Pulmonary/Chest: Effort normal and breath sounds normal. No respiratory distress.  Abdominal: Soft. Bowel sounds are normal. There is tenderness in the epigastric area.  Genitourinary: Prostate normal. Rectal exam shows no mass, no tenderness and anal tone normal.  No gross blood or stool in the rectal vault. RN served as Producer, television/film/video during the rectal exam.  Musculoskeletal: He exhibits no edema or tenderness.  Neurological: He is alert.  Skin: Skin is warm and dry. He is not diaphoretic.  Nursing note and vitals reviewed.   ED Course  Procedures (including critical care time) Labs Review Labs Reviewed  CBC WITH DIFFERENTIAL/PLATELET - Abnormal; Notable for the following:    RBC 3.66 (*)    Hemoglobin 11.6 (*)    HCT 35.3 (*)    All other components within normal limits  COMPREHENSIVE METABOLIC PANEL - Abnormal; Notable for the following:    Glucose, Bld 111 (*)    AST 43 (*)    All other components within normal limits  LIPASE, BLOOD - Abnormal; Notable for the following:    Lipase 69 (*)    All other components within normal limits  URINALYSIS, ROUTINE W REFLEX MICROSCOPIC (NOT AT [facility])  POC OCCULT BLOOD, ED   HEMOGLOBIN  Date Value Ref  Range Status  05/17/2015 11.6* 13.0 - 17.0 g/dL Final  05/12/2015 13.2 13.0 - 17.0 g/dL Final  05/01/2015 13.0 13.0 - 17.0 g/dL Final  04/26/2015 12.8* 13.0 - 17.0 g/dL Final      Imaging Review Ct Abdomen Pelvis W Contrast  05/18/2015  CLINICAL DATA:  45 year old male with left upper quadrant abdominal pain. History of pancreatitis. EXAM: CT ABDOMEN AND PELVIS WITH CONTRAST TECHNIQUE: Multidetector CT imaging of the abdomen and pelvis was performed using the standard protocol following bolus administration of intravenous contrast. CONTRAST:  154mL OMNIPAQUE IOHEXOL 300 MG/ML  SOLN COMPARISON:  CT dated 04/24/2015 FINDINGS: The visualized lung bases are clear. No intra-abdominal free air or free fluid. There is inflammatory changes of the pancreas with peripancreatic stranding most prominent involving the head and uncinate process compatible with known pancreatitis. Multiple pancreatic and peripancreatic hypodense lesions are compatible with known pseudocysts. The largest such cyst measures 2.2 x 1.7 cm at the head of the pancreas (previously measured 1.9 x 1.5 cm). A 1.4 x 1.4 cm hypodense lesion at the pancreatic tail previously measured 1.6 x 1.6 cm. The liver, gallbladder, spleen, adrenal glands, kidneys, visualized ureters, and urinary bladder appear unremarkable. The prostate and seminal vesicles are grossly unremarkable. There is apparent thickening of the gastric rugal folds. Correlation with clinical exam is recommended to evaluate for gastritis. Moderate stool throughout the colon. There is no evidence of bowel obstruction. Normal appendix. Mild aortoiliac atherosclerotic disease. The origins of the celiac axis, SMA, IMA as well as the origins of the renal arteries are patent. The SMV, splenic vein, and main portal veins are patent. No portal venous gas identified. There is a mild periportal edema. No adenopathy. Small fat containing umbilical hernia. The abdominal wall soft tissues appear  unremarkable. The osseus structures are intact. IMPRESSION: Pancreatitis with multiple pseudocysts as described. Apparent thickening of the rugal folds. Clinical correlation is recommended to evaluate for gastritis. No bowel obstruction. Normal appendix. Electronically Signed   By: Anner Crete M.D.   On: 05/18/2015 01:09   I have personally reviewed and evaluated these images and lab results as part of my medical decision-making.   EKG Interpretation None  MDM   Final diagnoses:  Epigastric pain  Non-intractable vomiting with nausea, vomiting of unspecified type  History of pancreatitis    Haig R Obenauer presents with sudden onset of epigastric pain following a meal this afternoon.  Findings and plan of care discussed with Delora Fuel, MD.  This patient's presentation is consistent with a pancreatitis, but the patient's lipase is not consistent with acute pancreatitis. Patient is nontoxic appearing, not tachycardic on my exam, is afebrile, is not tachypneic, maintains SPO2 100% on room air, and is in mild distress due to pain. CT scan was ordered due to the possibility of other issues going on. Patient's pain and nausea controlled with conservative management here in the ED. Chart review reveals that the patient has been admitted in the past for management of his pancreatitis, but does not get admitted every time. Patient's hemoglobin was noted to be 11.6, which is almost 2 points lower than it was 5 days ago. Hemoccult value is negative. With the patient being asymptomatic to this change, but having some indication that he occasionally has blood in his stool, patient will be encouraged to follow up with GI outpatient.  1:11 AM End of shift patient care handoff report given to Maximiano Coss, PA-C. Plan to continue to treat patient's pain and nausea, give IV fluids, review CT results, and unless otherwise noted discharge the patient with GI follow-up versus admission, if  indicated.  Filed Vitals:   05/17/15 1932 05/17/15 2218 05/18/15 0100  BP: 152/89 161/108 157/107  Pulse: 104 96 92  Temp: 97.7 F (36.5 C)  97.6 F (36.4 C)  TempSrc: Oral  Oral  Resp: 20 18 18   Height: 5\' 8"  (1.727 m)    Weight: 66.679 kg    SpO2: 98% 100% 100%     Lorayne Bender, PA-C [phone]  Delora Fuel, MD [phone] [id number]

## 2015-05-17 NOTE — ED Notes (Signed)
Per EMS pt has c/o LUQ abdominal pain after eating fried cube steak.  +nausea/vomiting.

## 2015-05-18 ENCOUNTER — Encounter (HOSPITAL_COMMUNITY): Payer: Self-pay

## 2015-05-18 DIAGNOSIS — F10229 Alcohol dependence with intoxication, unspecified: Secondary | ICD-10-CM

## 2015-05-18 DIAGNOSIS — K863 Pseudocyst of pancreas: Secondary | ICD-10-CM | POA: Diagnosis present

## 2015-05-18 DIAGNOSIS — F1910 Other psychoactive substance abuse, uncomplicated: Secondary | ICD-10-CM

## 2015-05-18 DIAGNOSIS — K861 Other chronic pancreatitis: Secondary | ICD-10-CM | POA: Diagnosis present

## 2015-05-18 LAB — RAPID URINE DRUG SCREEN, HOSP PERFORMED
AMPHETAMINES: NOT DETECTED
BARBITURATES: POSITIVE — AB
BENZODIAZEPINES: NOT DETECTED
COCAINE: POSITIVE — AB
Opiates: POSITIVE — AB
TETRAHYDROCANNABINOL: NOT DETECTED

## 2015-05-18 LAB — POC OCCULT BLOOD, ED: Fecal Occult Bld: NEGATIVE

## 2015-05-18 MED ORDER — HYDRALAZINE HCL 20 MG/ML IJ SOLN
10.0000 mg | INTRAMUSCULAR | Status: DC | PRN
  Administered 2015-05-19 (×2): 10 mg via INTRAVENOUS
  Filled 2015-05-18 (×2): qty 1

## 2015-05-18 MED ORDER — QUETIAPINE FUMARATE 100 MG PO TABS
100.0000 mg | ORAL_TABLET | Freq: Every day | ORAL | Status: DC
  Administered 2015-05-18 – 2015-05-24 (×7): 100 mg via ORAL
  Filled 2015-05-18 (×8): qty 1

## 2015-05-18 MED ORDER — ONDANSETRON HCL 4 MG/2ML IJ SOLN: 4.0000 mg | Freq: Once | INTRAMUSCULAR | Status: DC | PRN

## 2015-05-18 MED ORDER — SODIUM CHLORIDE 0.9 % IV BOLUS (SEPSIS)
1000.0000 mL | Freq: Once | INTRAVENOUS | Status: AC
  Administered 2015-05-18: 1000 mL via INTRAVENOUS

## 2015-05-18 MED ORDER — METOPROLOL TARTRATE 25 MG PO TABS
25.0000 mg | ORAL_TABLET | Freq: Two times a day (BID) | ORAL | Status: DC
  Administered 2015-05-18 – 2015-05-19 (×3): 25 mg via ORAL
  Filled 2015-05-18 (×4): qty 1

## 2015-05-18 MED ORDER — MORPHINE SULFATE (PF) 4 MG/ML IV SOLN
4.0000 mg | Freq: Once | INTRAVENOUS | Status: AC
  Administered 2015-05-18: 4 mg via INTRAVENOUS
  Filled 2015-05-18: qty 1

## 2015-05-18 MED ORDER — GI COCKTAIL ~~LOC~~
30.0000 mL | Freq: Three times a day (TID) | ORAL | Status: DC | PRN
  Administered 2015-05-18 – 2015-05-25 (×14): 30 mL via ORAL
  Filled 2015-05-18 (×19): qty 30

## 2015-05-18 MED ORDER — OXYCODONE HCL 5 MG PO TABS
10.0000 mg | ORAL_TABLET | ORAL | Status: DC | PRN
  Administered 2015-05-18 – 2015-05-25 (×37): 10 mg via ORAL
  Filled 2015-05-18 (×37): qty 2

## 2015-05-18 MED ORDER — LORAZEPAM 2 MG/ML IJ SOLN
1.0000 mg | Freq: Four times a day (QID) | INTRAMUSCULAR | Status: AC | PRN
  Administered 2015-05-19 – 2015-05-20 (×3): 1 mg via INTRAVENOUS
  Filled 2015-05-18 (×3): qty 1

## 2015-05-18 MED ORDER — CYANOCOBALAMIN 500 MCG PO TABS
500.0000 ug | ORAL_TABLET | Freq: Every day | ORAL | Status: DC
  Administered 2015-05-18 – 2015-05-25 (×8): 500 ug via ORAL
  Filled 2015-05-18 (×8): qty 1

## 2015-05-18 MED ORDER — ADULT MULTIVITAMIN W/MINERALS CH
1.0000 | ORAL_TABLET | Freq: Every day | ORAL | Status: DC
  Administered 2015-05-18 – 2015-05-25 (×8): 1 via ORAL
  Filled 2015-05-18 (×8): qty 1

## 2015-05-18 MED ORDER — ALBUTEROL SULFATE (2.5 MG/3ML) 0.083% IN NEBU: 2.5000 mg | INHALATION_SOLUTION | Freq: Four times a day (QID) | RESPIRATORY_TRACT | Status: DC | PRN

## 2015-05-18 MED ORDER — PANCRELIPASE (LIP-PROT-AMYL) 12000-38000 UNITS PO CPEP
24000.0000 [IU] | ORAL_CAPSULE | Freq: Three times a day (TID) | ORAL | Status: DC
  Administered 2015-05-18 – 2015-05-25 (×21): 24000 [IU] via ORAL
  Filled 2015-05-18 (×26): qty 2

## 2015-05-18 MED ORDER — SODIUM CHLORIDE 0.9 % IV SOLN
INTRAVENOUS | Status: DC
  Administered 2015-05-18 – 2015-05-20 (×4): via INTRAVENOUS

## 2015-05-18 MED ORDER — FENTANYL CITRATE (PF) 100 MCG/2ML IJ SOLN
50.0000 ug | INTRAMUSCULAR | Status: DC | PRN
  Administered 2015-05-18 – 2015-05-20 (×12): 50 ug via INTRAVENOUS
  Filled 2015-05-18 (×12): qty 2

## 2015-05-18 MED ORDER — OXYCODONE HCL ER 20 MG PO T12A
20.0000 mg | EXTENDED_RELEASE_TABLET | Freq: Two times a day (BID) | ORAL | Status: DC
  Administered 2015-05-18 – 2015-05-25 (×15): 20 mg via ORAL
  Filled 2015-05-18 (×15): qty 1

## 2015-05-18 MED ORDER — ALBUTEROL SULFATE HFA 108 (90 BASE) MCG/ACT IN AERS: 2.0000 | INHALATION_SPRAY | Freq: Four times a day (QID) | RESPIRATORY_TRACT | Status: DC | PRN

## 2015-05-18 MED ORDER — PANTOPRAZOLE SODIUM 40 MG PO TBEC
40.0000 mg | DELAYED_RELEASE_TABLET | Freq: Every day | ORAL | Status: DC
  Administered 2015-05-18: 40 mg via ORAL
  Filled 2015-05-18: qty 1

## 2015-05-18 MED ORDER — NICOTINE 21 MG/24HR TD PT24
21.0000 mg | MEDICATED_PATCH | Freq: Every day | TRANSDERMAL | Status: DC
  Administered 2015-05-18 – 2015-05-25 (×8): 21 mg via TRANSDERMAL
  Filled 2015-05-18 (×10): qty 1

## 2015-05-18 MED ORDER — ONDANSETRON 4 MG PO TBDP: 4.0000 mg | ORAL_TABLET | Freq: Three times a day (TID) | ORAL | Status: DC | PRN

## 2015-05-18 MED ORDER — IOHEXOL 300 MG/ML  SOLN
100.0000 mL | Freq: Once | INTRAMUSCULAR | Status: AC | PRN
  Administered 2015-05-18: 100 mL via INTRAVENOUS

## 2015-05-18 MED ORDER — AMITRIPTYLINE HCL 25 MG PO TABS
25.0000 mg | ORAL_TABLET | Freq: Every day | ORAL | Status: DC
  Administered 2015-05-18 – 2015-05-24 (×7): 25 mg via ORAL
  Filled 2015-05-18 (×8): qty 1

## 2015-05-18 MED ORDER — THIAMINE HCL 100 MG/ML IJ SOLN
Freq: Once | INTRAVENOUS | Status: AC
  Administered 2015-05-18: 06:00:00 via INTRAVENOUS
  Filled 2015-05-18: qty 1000

## 2015-05-18 MED ORDER — PANTOPRAZOLE SODIUM 40 MG PO TBEC
40.0000 mg | DELAYED_RELEASE_TABLET | Freq: Two times a day (BID) | ORAL | Status: DC
  Administered 2015-05-18 – 2015-05-25 (×14): 40 mg via ORAL
  Filled 2015-05-18 (×18): qty 1

## 2015-05-18 MED ORDER — LORAZEPAM 1 MG PO TABS: 1.0000 mg | ORAL_TABLET | Freq: Four times a day (QID) | ORAL | Status: AC | PRN

## 2015-05-18 MED ORDER — HEPARIN SODIUM (PORCINE) 5000 UNIT/ML IJ SOLN
5000.0000 [IU] | Freq: Three times a day (TID) | INTRAMUSCULAR | Status: DC
Start: 2015-05-18 — End: 2015-05-25
  Administered 2015-05-18 – 2015-05-25 (×21): 5000 [IU] via SUBCUTANEOUS
  Filled 2015-05-18 (×25): qty 1

## 2015-05-18 MED ORDER — HYDROMORPHONE HCL 1 MG/ML IJ SOLN
1.0000 mg | Freq: Once | INTRAMUSCULAR | Status: AC
  Administered 2015-05-18: 1 mg via INTRAVENOUS
  Filled 2015-05-18: qty 1

## 2015-05-18 MED ORDER — FOLIC ACID 1 MG PO TABS
1.0000 mg | ORAL_TABLET | Freq: Every day | ORAL | Status: DC
  Administered 2015-05-18 – 2015-05-25 (×8): 1 mg via ORAL
  Filled 2015-05-18 (×8): qty 1

## 2015-05-18 MED ORDER — SUCRALFATE 1 G PO TABS
1.0000 g | ORAL_TABLET | Freq: Three times a day (TID) | ORAL | Status: DC
  Administered 2015-05-18 – 2015-05-25 (×30): 1 g via ORAL
  Filled 2015-05-18 (×35): qty 1

## 2015-05-18 MED ORDER — METOCLOPRAMIDE HCL 5 MG/ML IJ SOLN
10.0000 mg | Freq: Once | INTRAMUSCULAR | Status: AC
  Administered 2015-05-18: 10 mg via INTRAVENOUS
  Filled 2015-05-18: qty 2

## 2015-05-18 NOTE — ED Notes (Signed)
Pt at CT

## 2015-05-18 NOTE — ED Provider Notes (Signed)
  Physical Exam  BP 157/107 mmHg  Pulse 92  Temp(Src) 97.6 F (36.4 C) (Oral)  Resp 18  Ht 5\' 8"  (1.727 m)  Wt 66.679 kg  BMI 22.36 kg/m2  SpO2 100%  Physical Exam Abdomen: Moderate epigastric tenderness to palpation. Guarding.  ED Course  Procedures  Patient with history of pancreatitis presents for epigastric abdominal pain that began yesterday. He has been seen 24 times in the past 6 months for the same. He says his pain began after eating a [place] steak and mashed potatoes. He also admits to drinking alcohol (beer) the last couple of days. CT abdomen shows pancreatitis.  Normal appendix and no bowel obstruction.  This patient was signed out to me by Arlean Hopping, PA-C. Plan is to reassess patient after Zofran and pain medication. He has had 2 doses of morphine and 2 doses of Zofran.  Recheck @ 1:30am: Patient states he is still 6/10 pain. I discussed with the patient that I would give him one additional dose of pain medicine and if he continued to have pain he would need admission to the hospital.  Recheck @ 2:20am: Patient states that his pain is still 6/10 after being given an additional dose of Dilaudid and Reglan. Will admit for pain control and fluids. I spoke to Dr. Tamala Julian will admit to MedSurg.   Ottie Glazier, PA-C [id number]  David Glick, MD [id number] [id number]

## 2015-05-18 NOTE — H&P (Signed)
[facility] History and Physical  [name] [id number] DOB: 1970/09/20 DOA: 05/17/2015  Referring physician: ED PCP: Jason Kanner, NP   Chief Complaint:  Abdominal pain  HPI:   [name] is a 45 year old male with a past medical history of polysubstance abuse( EtoH,  cocaine, tobacco), pancreatitis, HTN, and MDD; who presents with complaints of abdominal pain.  Symptoms started today at approximately 3 PM. Reports sharp pain in the left upper quadrant radiating to the back at one point.  Notes pain was a 9 out of 10 on the pain scale. Had associated symptoms of nausea , vomiting, and diarrhea. He states that he did not try anything  for pain symptoms. Denies any chest pain, Last drink was reportedly was yesterday(1/7).  Reports drinking 24 ounce beers 1-2 /day normally.  [name] states that he has gone into alcoholwithdrawals before, but does not feel like he is  going into them currently.  Longest period in time which is gone without drinking is been about 1 week.  Reports previously quitting alcohol between November 2015 and March 2016 and is no longer going to [employer].   Note patient has had  3 previous admissions in the last 6 months and had at least 25 ER visits.  Patient has not followed up with a GI specialist as he was supposed to get an [facility] card so that he could make appointments to see health care providers like a GI specialist this Wednesday(1/11).  Upon admission to the emergency department patient is evaluated lipase 69. CT of the abdomen revealed pancreatitis with multiple pseudocysts.   Patient reported streaks of blood in stool. Fecal occult was preformed and noted to be negative. He received  Morphine , Dilaudid, and 2 doses of Zofran. Admitted to MedSurg floor as pain not controlled.    Review of Systems  Constitutional: Positive for malaise/fatigue and diaphoresis. Negative for chills.  HENT: Negative for ear pain and hearing loss.   Eyes: Negative for  double vision and photophobia.  Respiratory: Negative for sputum production and shortness of breath.   Cardiovascular: Negative for chest pain and palpitations.  Gastrointestinal: Positive for nausea, vomiting, abdominal pain, diarrhea and blood in stool.  Genitourinary: Negative for urgency and frequency.  Musculoskeletal: Negative for neck pain.  Skin: Negative for itching and rash.  Neurological: Negative for speech change and focal weakness.  Endo/Heme/Allergies: Negative for environmental allergies. Does not bruise/bleed easily.  Psychiatric/Behavioral: Positive for substance abuse. Negative for suicidal ideas.      Past Medical History  Diagnosis Date  . Hypertension   . Asthma   . Pancreatitis   . Cocaine abuse   . Depression   . H/O suicide attempt 10/2012  . Heart murmur     "when he was little" (03/06/2013)  . Anemia   . H/O hiatal hernia   . GERD (gastroesophageal reflux disease)   . Anxiety   . WPW (Wolff-Parkinson-White syndrome)     [name] 03/06/2013  . High cholesterol   . Femoral condyle fracture ([place]) 03/08/2014    left medial/notes 03/09/2014  . Alcoholism /alcohol abuse ([place])   . Family history of adverse reaction to anesthesia     "grandmother gets confused"  . Shortness of breath     "can happen at anytime" (03/06/2013)  . Pneumonia 1990's X 3  . Chronic bronchitis ([place])   . Sickle cell trait ([place])   . History of blood transfusion 10/2012    "when I tried to commit suicide"  .  History of stomach ulcers   . Migraine     "a few times/year" (03/26/2015)  . Arthritis     "knees; arms; elbows" (03/26/2015)  . Chronic lower back pain   . Bipolar disorder ([place])   . PTSD (post-traumatic stress disorder)      Past Surgical History  Procedure Laterality Date  . Facial fracture surgery Left 1990's    "result of trauma"   . Eye surgery Left 1990's    "result of trauma"   . Left heart catheterization with coronary angiogram Right 03/07/2013     Procedure: LEFT HEART CATHETERIZATION WITH CORONARY ANGIOGRAM;  Surgeon: [name], MD;  Location: [facility];  Service: Cardiovascular;  Laterality: Right;  . Cardiac catheterization    . Fracture surgery    . Umbilical hernia repair    . Hernia repair        Social History:  reports that he has been smoking Cigarettes.  He has a 30 pack-year smoking history. His smokeless tobacco use includes Chew. He reports that he drinks about 37.8 oz of alcohol per week. He reports that he uses illicit drugs (Marijuana and Cocaine). Can patient participate in ADLs? Yes  Allergies  Allergen Reactions  . Shellfish-Derived Products Nausea And Vomiting  . Trazodone And Nefazodone Other (See Comments)    Muscle spasms  . Diazepam Other (See Comments)    Arm was jerking uncontrollably (patient denies this allergy and states that he can tolerate)    Family History  Problem Relation Age of Onset  . Hypertension Other   . Coronary artery disease Other        Prior to Admission medications   Medication Sig Start Date End Date Taking? Authorizing Provider  albuterol (PROVENTIL HFA;VENTOLIN HFA) 108 (90 BASE) MCG/ACT inhaler Inhale 2 puffs into the lungs every 6 (six) hours as needed for wheezing or shortness of breath (wheezing).    Yes Historical Provider, MD  amitriptyline (ELAVIL) 25 MG tablet Take 25 mg by mouth at bedtime.   Yes Historical Provider, MD  folic acid (FOLVITE) 1 MG tablet Take 1 tablet (1 mg total) by mouth daily. 01/15/15  Yes [name], MD  lipase/protease/amylase (CREON) 12000 UNITS CPEP capsule Take 2 capsules (24,000 Units total) by mouth 3 (three) times daily with meals. 01/15/15  Yes [name], MD  metoprolol tartrate (LOPRESSOR) 25 MG tablet Take 1 tablet (25 mg total) by mouth 2 (two) times daily. 01/15/15  Yes [name], MD  Multiple Vitamin (MULTIVITAMIN) capsule Take 1 capsule by mouth daily. 02/06/14  Yes [name], MD  omeprazole  (PRILOSEC) 20 MG capsule Take 1 capsule (20 mg total) by mouth daily. 04/20/15  Yes [name], PA-C  oxyCODONE-acetaminophen (PERCOCET/ROXICET) 5-325 MG tablet Take 1 tablet by mouth every 4 (four) hours as needed for severe pain. 04/24/15  Yes Olivia Canter Sam, PA-C  promethazine (PHENERGAN) 25 MG tablet Take 1 tablet (25 mg total) by mouth every 6 (six) hours as needed for nausea or vomiting. 04/20/15  Yes [name], PA-C  QUEtiapine (SEROQUEL) 50 MG tablet Take 100 mg by mouth at bedtime. 01/15/15  Yes Historical Provider, MD  sildenafil (VIAGRA) 50 MG tablet Take 1 tablet (50 mg total) by mouth daily as needed for erectile dysfunction. 03/31/15  Yes Olugbemiga Essie Christine, MD  sucralfate (CARAFATE) 1 G tablet Take 1 tablet (1 g total) by mouth 4 (four) times daily -  with meals and at bedtime. 03/15/15  Yes  Charlann Lange, PA-C  vitamin B-12 500 MCG tablet Take 1 tablet (500 mcg total) by mouth daily. 03/30/15  Yes [name], MD  ondansetron (ZOFRAN ODT) 4 MG disintegrating tablet Take 1 tablet (4 mg total) by mouth every 8 (eight) hours as needed for nausea or vomiting. 05/18/15   Lorayne Bender, PA-C     Physical Exam: Filed Vitals:   05/17/15 1932 05/17/15 2218 05/18/15 0100 05/18/15 0309  BP: 152/89 161/108 157/107 145/96  Pulse: 104 96 92 98  Temp: 97.7 F (36.5 C)  97.6 F (36.4 C)   TempSrc: Oral  Oral   Resp: 20 18 18 17   Height: 5\' 8"  (1.727 m)     Weight: 66.679 kg (147 lb)     SpO2: 98% 100% 100% 97%     Constitutional: Vital signs reviewed. Patient is a well-developed and well-nourished in no acute distress and cooperative with exam. Alert and oriented x3.  Head: Normocephalic and atraumatic  Ear: TM normal bilaterally  Mouth: no erythema or exudates, MMM  Eyes: PERRL, EOMI, conjunctivae normal, No scleral icterus.  Neck: Supple, Trachea midline normal ROM, No JVD, mass, thyromegaly, or carotid bruit present.  Cardiovascular: RRR, S1 normal, S2 normal, no MRG,  pulses symmetric and intact bilaterally  Pulmonary/Chest: CTAB, no wheezes, rales, or rhonchi  Abdominal: Soft.  Tenderness to palpation along the left upper quadrant, non-distended, bowel sounds are normal, no masses, organomegaly, or guarding present.  GU: no CVA tenderness Musculoskeletal: No joint deformities, erythema, or stiffness, ROM full and no nontender Ext: no edema and no cyanosis, pulses palpable bilaterally (DP and PT)  Hematology: no cervical, inginal, or axillary adenopathy.  Neurological: A&O x3, Strenght is normal and symmetric bilaterally, cranial nerve II-XII are grossly intact, no focal motor deficit, sensory intact to light touch bilaterally.  Skin: Warm, dry and intact. No rash, cyanosis, or clubbing.  Psychiatric: Normal mood and affect. speech and behavior is normal. Judgment and thought content normal. Cognition and memory are normal.      Data Review   Micro Results No results found for this or any previous visit (from the past 240 hour(s)).  Radiology Reports Ct Abdomen Pelvis W Contrast  05/18/2015  CLINICAL DATA:  45 year old male with left upper quadrant abdominal pain. History of pancreatitis. EXAM: CT ABDOMEN AND PELVIS WITH CONTRAST TECHNIQUE: Multidetector CT imaging of the abdomen and pelvis was performed using the standard protocol following bolus administration of intravenous contrast. CONTRAST:  160mL OMNIPAQUE IOHEXOL 300 MG/ML  SOLN COMPARISON:  CT dated 04/24/2015 FINDINGS: The visualized lung bases are clear. No intra-abdominal free air or free fluid. There is inflammatory changes of the pancreas with peripancreatic stranding most prominent involving the head and uncinate process compatible with known pancreatitis. Multiple pancreatic and peripancreatic hypodense lesions are compatible with known pseudocysts. The largest such cyst measures 2.2 x 1.7 cm at the head of the pancreas (previously measured 1.9 x 1.5 cm). A 1.4 x 1.4 cm hypodense lesion at the  pancreatic tail previously measured 1.6 x 1.6 cm. The liver, gallbladder, spleen, adrenal glands, kidneys, visualized ureters, and urinary bladder appear unremarkable. The prostate and seminal vesicles are grossly unremarkable. There is apparent thickening of the gastric rugal folds. Correlation with clinical exam is recommended to evaluate for gastritis. Moderate stool throughout the colon. There is no evidence of bowel obstruction. Normal appendix. Mild aortoiliac atherosclerotic disease. The origins of the celiac axis, SMA, IMA as well as the origins of the renal arteries are patent. The  SMV, splenic vein, and main portal veins are patent. No portal venous gas identified. There is a mild periportal edema. No adenopathy. Small fat containing umbilical hernia. The abdominal wall soft tissues appear unremarkable. The osseus structures are intact. IMPRESSION: Pancreatitis with multiple pseudocysts as described. Apparent thickening of the rugal folds. Clinical correlation is recommended to evaluate for gastritis. No bowel obstruction. Normal appendix. Electronically Signed   By: Anner Crete M.D.   On: 05/18/2015 01:09   Ct Abdomen Pelvis W Contrast  04/24/2015  CLINICAL DATA:  Abdominal pain just above the umbilicus. Vomiting. History pancreatitis. Cocaine abuse. EXAM: CT ABDOMEN AND PELVIS WITH CONTRAST TECHNIQUE: Multidetector CT imaging of the abdomen and pelvis was performed using the standard protocol following bolus administration of intravenous contrast. CONTRAST:  167mL OMNIPAQUE IOHEXOL 300 MG/ML  SOLN COMPARISON:  03/26/2015 FINDINGS: Lower chest: Clear lung bases. Normal heart size without pericardial or pleural effusion. Hepatobiliary: Normal liver. Normal gallbladder, without biliary ductal dilatation. Pancreas: Cystic lesion within the pancreatic head/ uncinate process measures 1.7 cm on image 36/ series 201. This measures 1.3 cm on the prior exam. Decreased size of a cystic lesion the  pancreatic neck, which measures only 9 mm today. pancreatic tail cystic lesion measures 1.6 cm today and is similar. No residual pancreatitis identified. No duct dilatation. Spleen: Normal in size, without focal abnormality. Adrenals/Urinary Tract: Normal adrenal glands. Normal kidneys, without hydronephrosis. Normal urinary bladder. Stomach/Bowel: Gastric wall thickening, with the greater curvature measuring 2.6 cm. This is similar to on the prior. Normal colon and terminal ileum. Normal appendix. Vascular/Lymphatic: Aortic and branch vessel atherosclerosis. No abdominopelvic adenopathy. Reproductive: Normal prostate. Other: No significant free fluid. Musculoskeletal: No acute osseous abnormality. IMPRESSION: 1. Re- demonstration of pancreatic pseudocysts. These are varying in size, as detailed above. No evidence of residual acute pancreatitis. 2. Persistent gastric wall thickening, suspicious for gastritis. 3. Age advanced atherosclerosis. Electronically Signed   By: Abigail Miyamoto M.D.   On: 04/24/2015 14:38     CBC  Recent Labs Lab 05/12/15 1513 05/17/15 1938  WBC 8.4 8.7  HGB 13.2 11.6*  HCT 39.9 35.3*  PLT 246 200  MCV 97.3 96.4  MCH 32.2 31.7  MCHC 33.1 32.9  RDW 13.9 13.3  LYMPHSABS  --  3.5  MONOABS  --  0.7  EOSABS  --  0.5  BASOSABS  --  0.0    Chemistries   Recent Labs Lab 05/12/15 1513 05/17/15 1938  NA 143 141  K 3.8 3.7  CL 106 107  CO2 27 25  GLUCOSE 100* 111*  BUN 11 7  CREATININE 0.90 0.63  CALCIUM 9.4 9.0  AST 54* 43*  ALT 55 39  ALKPHOS 116 114  BILITOT 0.5 0.5   ------------------------------------------------------------------------------------------------------------------ estimated creatinine clearance is 110 mL/min (by C-G formula based on Cr of 0.63). ------------------------------------------------------------------------------------------------------------------ No results for input(s): HGBA1C in the last 72  hours. ------------------------------------------------------------------------------------------------------------------ No results for input(s): CHOL, HDL, LDLCALC, TRIG, CHOLHDL, LDLDIRECT in the last 72 hours. ------------------------------------------------------------------------------------------------------------------ No results for input(s): TSH, T4TOTAL, T3FREE, THYROIDAB in the last 72 hours.  Invalid input(s): FREET3 ------------------------------------------------------------------------------------------------------------------ No results for input(s): VITAMINB12, FOLATE, FERRITIN, TIBC, IRON, RETICCTPCT in the last 72 hours.  Coagulation profile No results for input(s): INR, PROTIME in the last 168 hours.  No results for input(s): DDIMER in the last 72 hours.  Cardiac Enzymes No results for input(s): CKMB, TROPONINI, MYOGLOBIN in the last 168 hours.  Invalid input(s): CK ------------------------------------------------------------------------------------------------------------------ Invalid input(s): POCBNP   CBG: No results  for input(s): GLUCAP in the last 168 hours.        Assessment/Plan Principal Problem:   Alcohol-induced chronic pancreatitis ([place]):  Appears to be more chronic issue. Patient with abdominal pain , nausea, vomiting , and diarrhea. Lipase only 69. CT of abdomen showing  Signs of pancreatitis with multiple pseudocysts present. -  Admit to MedSurg bed -  IV fluids banana bag 100cc/hr -  Fentanyl 50 mcg every 4 hours when necessary pain -  Continue Creon -  May want to consult GI in a.m. -  Check EKG   Pseudocyst of pancreas : Appears to have interval increase in size of  Pseudocyst based off CT.  Essential hypertension -  Hydralazine prn sbp> 160 or dbp>100 -  Continue metoprolol 25 mg twice a day   Anemia:  Acute. Note patient was just seen have H&H of 13.2 and 39.9 respectively 5 days prior to admission today where H&H was 11.6 and  35.3. Note patient complained of streaks of blood although occult fecal stool negative. -  Recheck CBC in a.m.  Polysubstance abuse (tobacco, cocaine, THC, and ETOH):  Chronic. Patient with previous UDS screens positive for cocaine and THC -  Checking UDS -  Social work consu  lt - CIWAA protocols   History of depression -  Continue amitriptyline and Seroquel  Code Status:   full Family Communication: bedside Disposition Plan: admit   Total time spent 55 minutes.Greater than 50% of this time was spent in counseling, explanation of diagnosis, planning of further management, and coordination of care  [facility] Pager [phone]  If 7PM-7AM, please contact night-coverage [email or web address] Password [email or web address] 05/18/2015, 3:41 AM

## 2015-05-18 NOTE — ED Notes (Signed)
Informed RN on 5th floor that pt will be delayed in transfer d/t Dr.Smith in the room assessing pt.  Pt will be transferred after Dr. Tamala Julian is finished.

## 2015-05-18 NOTE — Progress Notes (Signed)
Patient seen and examined  45 y.o. male with history of HTN, pancreatitis, alcohol/polysubstance abuse, suicide attempts who presents to the ED for evaluation of epigastric and LUQ abdominal pain.. 24 ER visits in 6 months for abdominal pain, 3 abdominal CT scans in 3 months,Pt states he has PCP f/u this week and is in the process of getting his [facility] card to schedule GI f/u.   Lipase mildly elevated at 69 upon admission, white count normal, renal function normal  UDS positive for barbiturates, opiates, cocaine  Plan Acute on chronic pancreatitis likely secondary to polysubstance abuse, ongoing alcohol abuse, CT of abdomen showing Signs of pancreatitis with multiple pseudocysts present. Continue conservative management, pain management,creon, IV fluids, Clear liquid diet  Hypertension-slightly hypotensive due to uncontrolled pain Continue metoprolol,prn hydralazine  Alcohol abuse, polysubstance abuse tobacco, cocaine, THC, and ETOH Continue CIWA protocol, social work consultation

## 2015-05-19 ENCOUNTER — Inpatient Hospital Stay (HOSPITAL_COMMUNITY): Payer: Self-pay

## 2015-05-19 DIAGNOSIS — K858 Other acute pancreatitis without necrosis or infection: Secondary | ICD-10-CM

## 2015-05-19 LAB — COMPREHENSIVE METABOLIC PANEL
ALT: 29 U/L (ref 17–63)
ANION GAP: 7 (ref 5–15)
AST: 25 U/L (ref 15–41)
Albumin: 3.4 g/dL — ABNORMAL LOW (ref 3.5–5.0)
Alkaline Phosphatase: 96 U/L (ref 38–126)
BUN: <5 mg/dL — ABNORMAL LOW (ref 6–20)
CHLORIDE: 105 mmol/L (ref 101–111)
CO2: 29 mmol/L (ref 22–32)
Calcium: 8.5 mg/dL — ABNORMAL LOW (ref 8.9–10.3)
Creatinine, Ser: 0.57 mg/dL — ABNORMAL LOW (ref 0.61–1.24)
Glucose, Bld: 143 mg/dL — ABNORMAL HIGH (ref 65–99)
POTASSIUM: 3.7 mmol/L (ref 3.5–5.1)
Sodium: 141 mmol/L (ref 135–145)
Total Bilirubin: 1 mg/dL (ref 0.3–1.2)
Total Protein: 6.9 g/dL (ref 6.5–8.1)

## 2015-05-19 LAB — CBC
HCT: 34.8 % — ABNORMAL LOW (ref 39.0–52.0)
Hemoglobin: 11 g/dL — ABNORMAL LOW (ref 13.0–17.0)
MCH: 31.8 pg (ref 26.0–34.0)
MCHC: 31.6 g/dL (ref 30.0–36.0)
MCV: 100.6 fL — AB (ref 78.0–100.0)
PLATELETS: 188 10*3/uL (ref 150–400)
RBC: 3.46 MIL/uL — ABNORMAL LOW (ref 4.22–5.81)
RDW: 13.7 % (ref 11.5–15.5)
WBC: 6.6 10*3/uL (ref 4.0–10.5)

## 2015-05-19 LAB — GLUCOSE, CAPILLARY: GLUCOSE-CAPILLARY: 91 mg/dL (ref 65–99)

## 2015-05-19 NOTE — Progress Notes (Signed)
[facility] PROGRESS NOTE  [name] [id number] DOB: [birth year] DOA: 05/17/2015 PCP: [name], NP  Assessment/Plan: 45 y.o. male with history of HTN, pancreatitis, alcohol/polysubstance abuse, suicide attempts who presents to the ED for evaluation of epigastric and LUQ abdominal pain.. 24 ER visits in 6 months for abdominal pain, 3 abdominal CT scans in 3 months,Pt states he has PCP f/u this week and is in the process of getting his [facility] card to schedule GI f/u.  Lipase mildly elevated at 69 upon admission, white count normal, renal function normal  Acute on chronic pancreatitis likely secondary to polysubstance abuse, ongoing alcohol abuse, CT of abdomen showing Signs of pancreatitis with multiple pseudocysts present. Continue conservative management, pain management,creon, IV fluids, Clear liquid diet  Hypertension-slightly hypotensive due to uncontrolled pain Continue prn hydralazine Hold metoprolol, positive for cocaine.   Alcohol abuse, polysubstance abuse tobacco, cocaine, THC, and ETOH Continue CIWA protocol, social work consultation   Cough; check Chest x ray.  Dizziness; check orthostatic vitlas. Might be related to multiples sedatives.   Code Status: Full code.  Family Communication: care discussed with patient.  Disposition Plan: remain inpatient for treatment of pancreatitis.    Consultants:  none  Procedures:  none  Antibiotics:  none  HPI/Subjective: Complaining of abdominal pain 7/10, reported dizziness on ambulation.  Had BM yesterday, coughing some. Complaining of sore throat.   Objective: Filed Vitals:   05/18/15 2157 05/19/15 0541  BP: 159/104 166/85  Pulse: 83 82  Temp:  97.7 F (36.5 C)  Resp:  18    Intake/Output Summary (Last 24 hours) at 05/19/15 1407 Last data filed at 05/19/15 1000  Gross per 24 hour  Intake   2040 ml  Output   5400 ml  Net  -3360 ml   Filed Weights   05/17/15 1932 05/18/15 0345  Weight:  66.679 kg (147 lb) 64.4 kg (141 lb 15.6 oz)    Exam:   General:  Alert in no distress  Cardiovascular: S 1, S 2 RRR  Respiratory: CTA  Abdomen: BS present, soft, mild tenderness  Musculoskeletal: no edema  Data Reviewed: Basic Metabolic Panel:  Recent Labs Lab 05/12/15 1513 05/17/15 1938 05/19/15 0540  NA 143 141 141  K 3.8 3.7 3.7  CL 106 107 105  CO2 27 25 29   GLUCOSE 100* 111* 143*  BUN 11 7 <5*  CREATININE 0.90 0.63 0.57*  CALCIUM 9.4 9.0 8.5*   Liver Function Tests:  Recent Labs Lab 05/12/15 1513 05/17/15 1938 05/19/15 0540  AST 54* 43* 25  ALT 55 39 29  ALKPHOS 116 114 96  BILITOT 0.5 0.5 1.0  PROT 8.2* 7.5 6.9  ALBUMIN 4.4 3.8 3.4*    Recent Labs Lab 05/12/15 1513 05/17/15 1938  LIPASE 31 69*   No results for input(s): AMMONIA in the last 168 hours. CBC:  Recent Labs Lab 05/12/15 1513 05/17/15 1938 05/19/15 0540  WBC 8.4 8.7 6.6  NEUTROABS  --  4.0  --   HGB 13.2 11.6* 11.0*  HCT 39.9 35.3* 34.8*  MCV 97.3 96.4 100.6*  PLT 246 200 188   Cardiac Enzymes: No results for input(s): CKTOTAL, CKMB, CKMBINDEX, TROPONINI in the last 168 hours. BNP (last 3 results) No results for input(s): BNP in the last 8760 hours.  ProBNP (last 3 results) No results for input(s): PROBNP in the last 8760 hours.  CBG: No results for input(s): GLUCAP in the last 168 hours.  No results found for this or any previous  visit (from the past 240 hour(s)).   Studies: Ct Abdomen Pelvis W Contrast  05/18/2015  CLINICAL DATA:  45 year old male with left upper quadrant abdominal pain. History of pancreatitis. EXAM: CT ABDOMEN AND PELVIS WITH CONTRAST TECHNIQUE: Multidetector CT imaging of the abdomen and pelvis was performed using the standard protocol following bolus administration of intravenous contrast. CONTRAST:  170mL OMNIPAQUE IOHEXOL 300 MG/ML  SOLN COMPARISON:  CT dated 04/24/2015 FINDINGS: The visualized lung bases are clear. No intra-abdominal free air  or free fluid. There is inflammatory changes of the pancreas with peripancreatic stranding most prominent involving the head and uncinate process compatible with known pancreatitis. Multiple pancreatic and peripancreatic hypodense lesions are compatible with known pseudocysts. The largest such cyst measures 2.2 x 1.7 cm at the head of the pancreas (previously measured 1.9 x 1.5 cm). A 1.4 x 1.4 cm hypodense lesion at the pancreatic tail previously measured 1.6 x 1.6 cm. The liver, gallbladder, spleen, adrenal glands, kidneys, visualized ureters, and urinary bladder appear unremarkable. The prostate and seminal vesicles are grossly unremarkable. There is apparent thickening of the gastric rugal folds. Correlation with clinical exam is recommended to evaluate for gastritis. Moderate stool throughout the colon. There is no evidence of bowel obstruction. Normal appendix. Mild aortoiliac atherosclerotic disease. The origins of the celiac axis, SMA, IMA as well as the origins of the renal arteries are patent. The SMV, splenic vein, and main portal veins are patent. No portal venous gas identified. There is a mild periportal edema. No adenopathy. Small fat containing umbilical hernia. The abdominal wall soft tissues appear unremarkable. The osseus structures are intact. IMPRESSION: Pancreatitis with multiple pseudocysts as described. Apparent thickening of the rugal folds. Clinical correlation is recommended to evaluate for gastritis. No bowel obstruction. Normal appendix. Electronically Signed   By: Anner Crete M.D.   On: 05/18/2015 01:09    Scheduled Meds: . amitriptyline  25 mg Oral QHS  . vitamin B-12  500 mcg Oral Daily  . folic acid  1 mg Oral Daily  . heparin  5,000 Units Subcutaneous 3 times per day  . lipase/protease/amylase  24,000 Units Oral TID WC  . metoprolol tartrate  25 mg Oral BID  . multivitamin with minerals  1 tablet Oral Daily  . nicotine  21 mg Transdermal Daily  . oxyCODONE  20 mg  Oral Q12H  . pantoprazole  40 mg Oral BID AC  . QUEtiapine  100 mg Oral QHS  . sucralfate  1 g Oral TID WC & HS   Continuous Infusions: . sodium chloride 125 mL/hr at 05/19/15 0052    Principal Problem:   Alcohol-induced chronic pancreatitis ([facility]) Active Problems:   TOBACCO ABUSE   Alcohol dependence ([facility])   Essential hypertension   Polysubstance abuse (tobacco, cocaine, THC, and ETOH)   Pancreatitis   Pseudocyst of pancreas    Time spent: 35 minutes.     Niel Hummer A  [facility] Pager [phone]. If 7PM-7AM, please contact night-coverage at [email or web address], password [facility] 05/19/2015, 2:07 PM  LOS: 1 day

## 2015-05-20 DIAGNOSIS — J189 Pneumonia, unspecified organism: Secondary | ICD-10-CM

## 2015-05-20 DIAGNOSIS — Y95 Nosocomial condition: Secondary | ICD-10-CM

## 2015-05-20 LAB — BASIC METABOLIC PANEL
Anion gap: 12 (ref 5–15)
CHLORIDE: 102 mmol/L (ref 101–111)
CO2: 24 mmol/L (ref 22–32)
CREATININE: 0.73 mg/dL (ref 0.61–1.24)
Calcium: 9.7 mg/dL (ref 8.9–10.3)
GFR calc non Af Amer: >60 mL/min (ref >60)
Glucose, Bld: 127 mg/dL — ABNORMAL HIGH (ref 65–99)
POTASSIUM: 3.3 mmol/L — AB (ref 3.5–5.1)
Sodium: 138 mmol/L (ref 135–145)

## 2015-05-20 LAB — BRAIN NATRIURETIC PEPTIDE: B NATRIURETIC PEPTIDE 5: 128 pg/mL — AB (ref 0.0–100.0)

## 2015-05-20 MED ORDER — POTASSIUM CHLORIDE CRYS ER 20 MEQ PO TBCR
40.0000 meq | EXTENDED_RELEASE_TABLET | Freq: Once | ORAL | Status: AC
  Administered 2015-05-20: 40 meq via ORAL
  Filled 2015-05-20: qty 2

## 2015-05-20 MED ORDER — PIPERACILLIN-TAZOBACTAM 3.375 G IVPB
3.3750 g | Freq: Three times a day (TID) | INTRAVENOUS | Status: DC
  Administered 2015-05-20 – 2015-05-23 (×9): 3.375 g via INTRAVENOUS
  Filled 2015-05-20 (×10): qty 50

## 2015-05-20 MED ORDER — VANCOMYCIN HCL IN DEXTROSE 1-5 GM/200ML-% IV SOLN
1000.0000 mg | Freq: Three times a day (TID) | INTRAVENOUS | Status: DC
  Administered 2015-05-20 – 2015-05-23 (×9): 1000 mg via INTRAVENOUS
  Filled 2015-05-20 (×10): qty 200

## 2015-05-20 MED ORDER — FENTANYL CITRATE (PF) 100 MCG/2ML IJ SOLN
50.0000 ug | INTRAMUSCULAR | Status: DC | PRN
  Administered 2015-05-20 – 2015-05-25 (×35): 50 ug via INTRAVENOUS
  Filled 2015-05-20 (×35): qty 2

## 2015-05-20 MED ORDER — AMLODIPINE BESYLATE 2.5 MG PO TABS
2.5000 mg | ORAL_TABLET | Freq: Every day | ORAL | Status: DC
  Administered 2015-05-20: 2.5 mg via ORAL
  Filled 2015-05-20 (×2): qty 1

## 2015-05-20 MED ORDER — FLUTICASONE PROPIONATE 50 MCG/ACT NA SUSP
1.0000 | Freq: Every day | NASAL | Status: DC
  Administered 2015-05-20 – 2015-05-25 (×6): 1 via NASAL
  Filled 2015-05-20: qty 16

## 2015-05-20 NOTE — Progress Notes (Signed)
ANTIBIOTIC CONSULT NOTE - INITIAL  Pharmacy Consult for vancomycin, Zosyn Indication: pneumonia  Allergies  Allergen Reactions  . Shellfish-Derived Products Nausea And Vomiting  . Trazodone And Nefazodone Other (See Comments)    Muscle spasms  . Diazepam Other (See Comments)    Arm was jerking uncontrollably (patient denies this allergy and states that he can tolerate)    Patient Measurements: Height: 5\' 8"  (172.7 cm) Weight: 141 lb 15.6 oz (64.4 kg) IBW/kg (Calculated) : 68.4  Vital Signs: Temp: 97.8 F (36.6 C) (01/11 0632) Temp Source: Oral (01/11 [id number]) BP: 150/93 mmHg (01/11 0911) Pulse Rate: 100 (01/11 [id number]) Intake/Output from previous day: 01/10 0701 - 01/11 0700 In: 3740 [P.O.:2640; I.V.:1100] Out: 6650 [Urine:6650] Intake/Output from this shift:    Labs:  Recent Labs  05/17/15 1938 05/19/15 0540  WBC 8.7 6.6  HGB 11.6* 11.0*  PLT 200 188  CREATININE 0.63 0.57*   Estimated Creatinine Clearance: 106.2 mL/min (by C-G formula based on Cr of 0.57). No results for input(s): VANCOTROUGH, VANCOPEAK, VANCORANDOM, GENTTROUGH, GENTPEAK, GENTRANDOM, TOBRATROUGH, TOBRAPEAK, TOBRARND, AMIKACINPEAK, AMIKACINTROU, AMIKACIN in the last 72 hours.   Microbiology: No results found for this or any previous visit (from the past 720 hour(s)).  Medical History: Past Medical History  Diagnosis Date  . Hypertension   . Asthma   . Pancreatitis   . Cocaine abuse   . Depression   . H/O suicide attempt 10/2012  . Heart murmur     "when he was little" (03/06/2013)  . Anemia   . H/O hiatal hernia   . GERD (gastroesophageal reflux disease)   . Anxiety   . WPW (Wolff-Parkinson-White syndrome)     [name] 03/06/2013  . High cholesterol   . Femoral condyle fracture ([place]) 03/08/2014    left medial/notes 03/09/2014  . Alcoholism /alcohol abuse ([place])   . Family history of adverse reaction to anesthesia     "grandmother gets confused"  . Shortness of breath     "can happen at  anytime" (03/06/2013)  . Pneumonia 1990's X 3  . Chronic bronchitis ([place])   . Sickle cell trait ([place])   . History of blood transfusion 10/2012    "when I tried to commit suicide"  . History of stomach ulcers   . Migraine     "a few times/year" (03/26/2015)  . Arthritis     "knees; arms; elbows" (03/26/2015)  . Chronic lower back pain   . Bipolar disorder ([place])   . PTSD (post-traumatic stress disorder)     Medications:  Scheduled:  . amitriptyline  25 mg Oral QHS  . vitamin B-12  500 mcg Oral Daily  . folic acid  1 mg Oral Daily  . heparin  5,000 Units Subcutaneous 3 times per day  . lipase/protease/amylase  24,000 Units Oral TID WC  . multivitamin with minerals  1 tablet Oral Daily  . nicotine  21 mg Transdermal Daily  . oxyCODONE  20 mg Oral Q12H  . pantoprazole  40 mg Oral BID AC  . QUEtiapine  100 mg Oral QHS  . sucralfate  1 g Oral TID WC & HS   Infusions:     Assessment: 45 yo presented to ER with CC abdominal pain on 1/9 with PMH of polysubstance abuse and pancreatitis now with multiple pseudocysts present. To start vancomycin and Zosyn per pharmacy dosing for HAP on 1/11. Afebrile, WBC WNL and CrCl 106 ml/min at baseline  Goal of Therapy:  Vancomycin trough level 15-20 mcg/ml  Plan:  1) vancomycin 1g IV q8 2) Zosyn 3.375g IV q8 (extended interval infusion)   Adrian Saran, PharmD, BCPS Pager [phone] 05/20/2015 9:36 AM

## 2015-05-20 NOTE — Progress Notes (Signed)
[facility] PROGRESS NOTE  [name] [id number] DOB: 1971-04-30 DOA: 05/17/2015 PCP: [name], NP  Assessment/Plan: 45 y.o. male with history of HTN, pancreatitis, alcohol/polysubstance abuse, suicide attempts who presents to the ED for evaluation of epigastric and LUQ abdominal pain.. 24 ER visits in 6 months for abdominal pain, 3 abdominal CT scans in 3 months,Pt states he has PCP f/u this week and is in the process of getting his orange card to schedule GI f/u.  Lipase mildly elevated at 69 upon admission, white count normal, renal function normal  Acute on chronic pancreatitis likely secondary to polysubstance abuse, ongoing alcohol abuse, CT of abdomen showing Signs of pancreatitis with multiple pseudocysts present. Continue conservative management, pain management,creon, IV fluids, Clear liquid diet Still with abdominal pain, change fentanyl to Q 3 hours PRN  Hypertension-slightly hypotensive due to uncontrolled pain Continue prn hydralazine Hold metoprolol, positive for cocaine.  Start low dose norvasc.   Alcohol abuse, polysubstance abuse tobacco, cocaine, THC, and ETOH Continue CIWA protocol, social work consultation   PNA, health care associated; chest x ray with infiltrates. Will treat for PNA. Oxygen sat 100 %. Check BNP. Might need lasix if develops hypoxemia or significant elevated BNP. NSL fluids.  Start Vancomycin and Zosyn 1-11.  Dizziness;  Might be related to multiples sedatives. Orthostatic vitals normal.   Code Status: Full code.  Family Communication: care discussed with patient.  Disposition Plan: remain inpatient for treatment of pancreatitis and now PNA. .    Consultants:  none  Procedures:  none  Antibiotics:  none  HPI/Subjective: Dizziness better, resolved.  Still with cough, mild dyspnea this am.  Still with 7/10 abdominal pain, asking to change fentanyl to dilaudid.    Objective: Filed Vitals:   05/20/15 0632  05/20/15 0911  BP: 145/93 150/93  Pulse: 100   Temp: 97.8 F (36.6 C)   Resp: 18     Intake/Output Summary (Last 24 hours) at 05/20/15 0946 Last data filed at 05/20/15 0600  Gross per 24 hour  Intake   3740 ml  Output   6000 ml  Net  -2260 ml   Filed Weights   05/17/15 1932 05/18/15 0345  Weight: 66.679 kg (147 lb) 64.4 kg (141 lb 15.6 oz)    Exam:   General:  Alert in no distress  Cardiovascular: S 1, S 2 RRR  Respiratory: CTA  Abdomen: BS present, soft, mild tenderness  Musculoskeletal: no edema  Data Reviewed: Basic Metabolic Panel:  Recent Labs Lab 05/17/15 1938 05/19/15 0540  NA 141 141  K 3.7 3.7  CL 107 105  CO2 25 29  GLUCOSE 111* 143*  BUN 7 <5*  CREATININE 0.63 0.57*  CALCIUM 9.0 8.5*   Liver Function Tests:  Recent Labs Lab 05/17/15 1938 05/19/15 0540  AST 43* 25  ALT 39 29  ALKPHOS 114 96  BILITOT 0.5 1.0  PROT 7.5 6.9  ALBUMIN 3.8 3.4*    Recent Labs Lab 05/17/15 1938  LIPASE 69*   No results for input(s): AMMONIA in the last 168 hours. CBC:  Recent Labs Lab 05/17/15 1938 05/19/15 0540  WBC 8.7 6.6  NEUTROABS 4.0  --   HGB 11.6* 11.0*  HCT 35.3* 34.8*  MCV 96.4 100.6*  PLT 200 188   Cardiac Enzymes: No results for input(s): CKTOTAL, CKMB, CKMBINDEX, TROPONINI in the last 168 hours. BNP (last 3 results) No results for input(s): BNP in the last 8760 hours.  ProBNP (last 3 results) No results for input(s):  PROBNP in the last 8760 hours.  CBG:  Recent Labs Lab 05/19/15 1442  GLUCAP 91    No results found for this or any previous visit (from the past 240 hour(s)).   Studies: Dg Chest 2 View  05/19/2015  CLINICAL DATA:  Shortness of breath and productive cough. EXAM: CHEST  2 VIEW COMPARISON:  03/05/2015 FINDINGS: Cardiomediastinal silhouette is normal. Mediastinal contours appear intact. There has been interval development of alveolar and interstitial infiltrates within bilateral lower lobes. Coarsening of  the interstitial markings and thickening of the interlobar fissures are also seen. There is no evidence of pleural effusion or pneumothorax. Osseous structures are without acute abnormality. Soft tissues are grossly normal. IMPRESSION: Coarsening of the interstitial markings in thickening of the interlobar fissures, suggestive of pulmonary vascular congestion. Symmetric interstitial and alveolar infiltrates in bilateral lower lobes, which may be seen due to development of pulmonary edema or developing airspace consolidation. Electronically Signed   By: Fidela Salisbury M.D.   On: 05/19/2015 15:38    Scheduled Meds: . amitriptyline  25 mg Oral QHS  . amLODipine  2.5 mg Oral Daily  . vitamin B-12  500 mcg Oral Daily  . folic acid  1 mg Oral Daily  . heparin  5,000 Units Subcutaneous 3 times per day  . lipase/protease/amylase  24,000 Units Oral TID WC  . multivitamin with minerals  1 tablet Oral Daily  . nicotine  21 mg Transdermal Daily  . oxyCODONE  20 mg Oral Q12H  . pantoprazole  40 mg Oral BID AC  . piperacillin-tazobactam (ZOSYN)  IV  3.375 g Intravenous Q8H  . QUEtiapine  100 mg Oral QHS  . sucralfate  1 g Oral TID WC & HS  . vancomycin  1,000 mg Intravenous Q8H   Continuous Infusions:    Principal Problem:   Alcohol-induced chronic pancreatitis (HCC) Active Problems:   TOBACCO ABUSE   Alcohol dependence (HCC)   Essential hypertension   Polysubstance abuse (tobacco, cocaine, THC, and ETOH)   Pancreatitis   Pseudocyst of pancreas    Time spent: 25 minutes.     Niel Hummer A  [facility] Pager [phone]. If 7PM-7AM, please contact night-coverage at [email or web address], password [facility] 05/20/2015, 9:46 AM  LOS: 2 days

## 2015-05-21 ENCOUNTER — Inpatient Hospital Stay (HOSPITAL_COMMUNITY): Payer: Self-pay

## 2015-05-21 LAB — BASIC METABOLIC PANEL
Anion gap: 11 (ref 5–15)
CALCIUM: 9.3 mg/dL (ref 8.9–10.3)
CO2: 25 mmol/L (ref 22–32)
CREATININE: 0.6 mg/dL — AB (ref 0.61–1.24)
Chloride: 102 mmol/L (ref 101–111)
GFR calc Af Amer: >60 mL/min (ref >60)
Glucose, Bld: 129 mg/dL — ABNORMAL HIGH (ref 65–99)
POTASSIUM: 3.6 mmol/L (ref 3.5–5.1)
SODIUM: 138 mmol/L (ref 135–145)

## 2015-05-21 LAB — CBC
HCT: 35.3 % — ABNORMAL LOW (ref 39.0–52.0)
Hemoglobin: 11.5 g/dL — ABNORMAL LOW (ref 13.0–17.0)
MCH: 31.7 pg (ref 26.0–34.0)
MCHC: 32.6 g/dL (ref 30.0–36.0)
MCV: 97.2 fL (ref 78.0–100.0)
PLATELETS: 182 10*3/uL (ref 150–400)
RBC: 3.63 MIL/uL — AB (ref 4.22–5.81)
RDW: 13.3 % (ref 11.5–15.5)
WBC: 13.1 10*3/uL — ABNORMAL HIGH (ref 4.0–10.5)

## 2015-05-21 LAB — GLUCOSE, CAPILLARY: GLUCOSE-CAPILLARY: 121 mg/dL — AB (ref 65–99)

## 2015-05-21 MED ORDER — SODIUM CHLORIDE 0.9 % IV SOLN
INTRAVENOUS | Status: DC
  Administered 2015-05-21 – 2015-05-25 (×3): via INTRAVENOUS

## 2015-05-21 MED ORDER — BENZOCAINE 10 % MT GEL
Freq: Four times a day (QID) | OROMUCOSAL | Status: DC | PRN
  Administered 2015-05-24: 08:00:00 via OROMUCOSAL
  Filled 2015-05-21 (×5): qty 9.4

## 2015-05-21 NOTE — Progress Notes (Signed)
[facility] PROGRESS NOTE  [name] [id number] DOB: 1970-12-02 DOA: 05/17/2015 PCP: [name], NP  Assessment/Plan: 45 y.o. male with history of HTN, pancreatitis, alcohol/polysubstance abuse, suicide attempts who presents to the ED for evaluation of epigastric and LUQ abdominal pain.. 24 ER visits in 6 months for abdominal pain, 3 abdominal CT scans in 3 months,Pt states he has PCP f/u this week and is in the process of getting his orange card to schedule GI f/u.  Lipase mildly elevated at 69 upon admission, white count normal, renal function normal  Acute on chronic pancreatitis likely secondary to polysubstance abuse, ongoing alcohol abuse, CT of abdomen showing Signs of pancreatitis with multiple pseudocysts present. Continue conservative management, pain management,creon, IV fluids, Clear liquid diet Still with abdominal pain, change fentanyl to Q 3 hours PRN on 1-11 Will repeat chest x ray if no significant pulmonary edema , will restart Small amount of IVF. Patient not eating enough/   Hypertension- Continue prn hydralazine Hold metoprolol, positive for cocaine.  SBP soft , hold norvasc.   Alcohol abuse, polysubstance abuse tobacco, cocaine, THC, and ETOH Continue CIWA protocol, social work consultation   PNA, health care associated; chest x ray with infiltrates. Will treat for PNA. Oxygen sat 100 %. Check BNP.  Continue with  Vancomycin and Zosyn started 1-11. Sputum culture.   Dizziness;  Might be related to multiples sedatives. Orthostatic vitals normal. Resolved.   Code Status: Full code.  Family Communication: care discussed with patient.  Disposition Plan: remain inpatient for treatment of pancreatitis and now PNA. .    Consultants:  none  Procedures:  none  Antibiotics:  none  HPI/Subjective: He is feeling better, breathing better.  Cough improved, sore throat improved.  Still with abdominal pain, not really tolerating clears     Objective: Filed Vitals:   05/20/15 2145 05/21/15 0510  BP: 147/91 119/72  Pulse: 110 119  Temp: 98.5 F (36.9 C) 98.3 F (36.8 C)  Resp: 16 16    Intake/Output Summary (Last 24 hours) at 05/21/15 1033 Last data filed at 05/21/15 0600  Gross per 24 hour  Intake   1230 ml  Output   3175 ml  Net  -1945 ml   Filed Weights   05/17/15 1932 05/18/15 0345  Weight: 66.679 kg (147 lb) 64.4 kg (141 lb 15.6 oz)    Exam:   General:  Alert in no distress  Cardiovascular: S 1, S 2 RRR  Respiratory: CTA  Abdomen: BS present, soft, mild tenderness  Musculoskeletal: no edema  Data Reviewed: Basic Metabolic Panel:  Recent Labs Lab 05/17/15 1938 05/19/15 0540 05/20/15 1054 05/21/15 0520  NA 141 141 138 138  K 3.7 3.7 3.3* 3.6  CL 107 105 102 102  CO2 25 29 24 25   GLUCOSE 111* 143* 127* 129*  BUN 7 <5* <5* <5*  CREATININE 0.63 0.57* 0.73 0.60*  CALCIUM 9.0 8.5* 9.7 9.3   Liver Function Tests:  Recent Labs Lab 05/17/15 1938 05/19/15 0540  AST 43* 25  ALT 39 29  ALKPHOS 114 96  BILITOT 0.5 1.0  PROT 7.5 6.9  ALBUMIN 3.8 3.4*    Recent Labs Lab 05/17/15 1938  LIPASE 69*   No results for input(s): AMMONIA in the last 168 hours. CBC:  Recent Labs Lab 05/17/15 1938 05/19/15 0540 05/21/15 0520  WBC 8.7 6.6 13.1*  NEUTROABS 4.0  --   --   HGB 11.6* 11.0* 11.5*  HCT 35.3* 34.8* 35.3*  MCV 96.4 100.6*  97.2  PLT 200 188 182   Cardiac Enzymes: No results for input(s): CKTOTAL, CKMB, CKMBINDEX, TROPONINI in the last 168 hours. BNP (last 3 results)  Recent Labs  05/20/15 1054  BNP 128.0*    ProBNP (last 3 results) No results for input(s): PROBNP in the last 8760 hours.  CBG:  Recent Labs Lab 05/19/15 1442  GLUCAP 91    No results found for this or any previous visit (from the past 240 hour(s)).   Studies: Dg Chest 2 View  05/19/2015  CLINICAL DATA:  Shortness of breath and productive cough. EXAM: CHEST  2 VIEW COMPARISON:   03/05/2015 FINDINGS: Cardiomediastinal silhouette is normal. Mediastinal contours appear intact. There has been interval development of alveolar and interstitial infiltrates within bilateral lower lobes. Coarsening of the interstitial markings and thickening of the interlobar fissures are also seen. There is no evidence of pleural effusion or pneumothorax. Osseous structures are without acute abnormality. Soft tissues are grossly normal. IMPRESSION: Coarsening of the interstitial markings in thickening of the interlobar fissures, suggestive of pulmonary vascular congestion. Symmetric interstitial and alveolar infiltrates in bilateral lower lobes, which may be seen due to development of pulmonary edema or developing airspace consolidation. Electronically Signed   By: Fidela Salisbury M.D.   On: 05/19/2015 15:38    Scheduled Meds: . amitriptyline  25 mg Oral QHS  . vitamin B-12  500 mcg Oral Daily  . fluticasone  1 spray Each Nare Daily  . folic acid  1 mg Oral Daily  . heparin  5,000 Units Subcutaneous 3 times per day  . lipase/protease/amylase  24,000 Units Oral TID WC  . multivitamin with minerals  1 tablet Oral Daily  . nicotine  21 mg Transdermal Daily  . oxyCODONE  20 mg Oral Q12H  . pantoprazole  40 mg Oral BID AC  . piperacillin-tazobactam (ZOSYN)  IV  3.375 g Intravenous Q8H  . QUEtiapine  100 mg Oral QHS  . sucralfate  1 g Oral TID WC & HS  . vancomycin  1,000 mg Intravenous Q8H   Continuous Infusions:    Principal Problem:   Alcohol-induced chronic pancreatitis (HCC) Active Problems:   TOBACCO ABUSE   Alcohol dependence (HCC)   Essential hypertension   Polysubstance abuse (tobacco, cocaine, THC, and ETOH)   Pancreatitis   Pseudocyst of pancreas   Hospital acquired PNA    Time spent: 25 minutes.     Niel Hummer A  [facility] Pager [phone]. If 7PM-7AM, please contact night-coverage at [email or web address], password [facility] 05/21/2015, 10:33 AM  LOS: 3 days

## 2015-05-21 NOTE — Progress Notes (Signed)
Spoke with Dr. Tyrell Antonio earlier via phone. MD aware pt requested Ambusol for toothache on lower left tooth. No swelling at area. Pt reported "It's been needed to be pulled for a while." Pt reassured. See new order received.

## 2015-05-21 NOTE — Care Management Note (Signed)
Case Management Note  Patient Details  Name: TILTON CATA MRN: [id number] Date of Birth: 1970/07/07  Subjective/Objective:   Admitted with pancreatitis                 Action/Plan: Received a referral for assistance with dental care. Patient states he has a tooth that needs to be pulled. Discussed with patient that few resources are available that are free, annual dental clinics can be found by calling the Health Dept. Provided patient resources found online via web search but all have qualifier. Encourage patient to call resources to see if any would work for his situation. Also referenced the [facility] at [facility] as they may have discounted  resources that fit his needs. Patient appreciative of resources, states has f/u planned with PCP.  Expected Discharge Date:  05/20/15               Expected Discharge Plan:  Home/Self Care  In-House Referral:  Clinical Social Work, Scientist, research (medical)  CM Consult  Post Acute Care Choice:  NA Choice offered to:  NA  DME Arranged:  N/A DME Agency:  NA  HH Arranged:  NA HH Agency:  NA  Status of Service:  Completed, signed off  Medicare Important Message Given:    Date Medicare IM Given:    Medicare IM give by:    Date Additional Medicare IM Given:    Additional Medicare Important Message give by:     If discussed at [place] of Stay Meetings, dates discussed:    Additional Comments:  Guadalupe Maple, RN 05/21/2015, 1:44 PM

## 2015-05-22 DIAGNOSIS — F1010 Alcohol abuse, uncomplicated: Secondary | ICD-10-CM

## 2015-05-22 DIAGNOSIS — K861 Other chronic pancreatitis: Secondary | ICD-10-CM

## 2015-05-22 DIAGNOSIS — J189 Pneumonia, unspecified organism: Secondary | ICD-10-CM

## 2015-05-22 DIAGNOSIS — K859 Acute pancreatitis without necrosis or infection, unspecified: Secondary | ICD-10-CM

## 2015-05-22 DIAGNOSIS — I10 Essential (primary) hypertension: Secondary | ICD-10-CM

## 2015-05-22 LAB — CBC
HEMATOCRIT: 34.7 % — AB (ref 39.0–52.0)
Hemoglobin: 11.3 g/dL — ABNORMAL LOW (ref 13.0–17.0)
MCH: 31.8 pg (ref 26.0–34.0)
MCHC: 32.6 g/dL (ref 30.0–36.0)
MCV: 97.7 fL (ref 78.0–100.0)
PLATELETS: 213 10*3/uL (ref 150–400)
RBC: 3.55 MIL/uL — AB (ref 4.22–5.81)
RDW: 12.9 % (ref 11.5–15.5)
WBC: 14.9 10*3/uL — AB (ref 4.0–10.5)

## 2015-05-22 LAB — BASIC METABOLIC PANEL
ANION GAP: 11 (ref 5–15)
BUN: <5 mg/dL — ABNORMAL LOW (ref 6–20)
CALCIUM: 9.2 mg/dL (ref 8.9–10.3)
CO2: 24 mmol/L (ref 22–32)
Chloride: 104 mmol/L (ref 101–111)
Creatinine, Ser: 0.92 mg/dL (ref 0.61–1.24)
GLUCOSE: 105 mg/dL — AB (ref 65–99)
POTASSIUM: 3.6 mmol/L (ref 3.5–5.1)
Sodium: 139 mmol/L (ref 135–145)

## 2015-05-22 LAB — VANCOMYCIN, TROUGH: Vancomycin Tr: 19 ug/mL (ref 10.0–20.0)

## 2015-05-22 NOTE — Progress Notes (Signed)
Pharmacy Antibiotic Follow-up Note  [name] is a 45 y.o. year-old male admitted on 05/17/2015.  The patient is currently on day 3 of Vanc 1gm IV q8h Lajean Silvius EI for HCAP.  Assessment/Plan: This patient's current antibiotics will be continued without adjustments.  Check Vancomycin trough with next dose. Monitor renal function and cx data.  Consider add atypical coverage if no improvement in PNA.  Temp (24hrs), Avg:98 F (36.7 C), Min:97.7 F (36.5 C), Max:98.4 F (36.9 C)   Recent Labs Lab 05/17/15 1938 05/19/15 0540 05/21/15 0520 05/22/15 0530  WBC 8.7 6.6 13.1* 14.9*    Recent Labs Lab 05/17/15 1938 05/19/15 0540 05/20/15 1054 05/21/15 0520 05/22/15 0530  CREATININE 0.63 0.57* 0.73 0.60* 0.92   Estimated Creatinine Clearance: 92.4 mL/min (by C-G formula based on Cr of 0.92).    Allergies  Allergen Reactions  . Shellfish-Derived Products Nausea And Vomiting  . Trazodone And Nefazodone Other (See Comments)    Muscle spasms  . Diazepam Other (See Comments)    Arm was jerking uncontrollably (patient denies this allergy and states that he can tolerate)    Antimicrobials this admission: 1/11 >> vanc >>  1/11 >> Zosyn >>   Levels/dose changes this admission: 1/13 VT= ______ on 1gm Q8h (prior to 8th dose)  Microbiology results: None  Thank you for allowing pharmacy to be a part of this patient's care.  Biagio Borg PharmD 05/22/2015 2:50 PM

## 2015-05-22 NOTE — Progress Notes (Signed)
Pharmacy - vancomycin dosing  Assessment:    Please see note from Netta Cedars, PharmD earlier today for full details.  Briefly, 45 y.o. male on vancomycin and Zosyn for HCAP   Vancomycin trough therapeutic at 19; drawn appropriately  Plan:   Continue vancomycin 1000 mg IV q8 hr.  Given frequent dosing and trough on higher end of range, would consider rechecking VT in 48 hrs if still on vancomycin. Not obese; does not appear to be at high risk of accumulation  Reuel Boom, PharmD, BCPS Pager: [phone] 05/22/2015, 6:18 PM

## 2015-05-22 NOTE — Progress Notes (Signed)
Patient came to nurse desk and reported that he called the hospital phone and asked to speak to pharmacy. Hospital directory directed forwarded him to pharmacy. He requested orajel from pharmacy. Pharmacy did call and confirm the patient called them.

## 2015-05-22 NOTE — Progress Notes (Signed)
Patient ID: [name], male   DOB: 1970-08-02, 45 y.o.   MRN: [id number] [facility] PRO[facility]ESS [facility]  HILL UNDERDAHL [id number] DOB: [birth year] DOA: 05/17/2015 PCP: [name], NP  Brief narrative:    45 y.o. male with past medical history of hypertension, alcohol and polysubstance abuse, pancreatitis, suicidal attempts in past. He presents to [facility] 05/17/2015 for evaluation of worsening epigastric pain associated with nausea and poor by mouth intake.  On admission, patient was hemodynamically stable. His lipase was mildly elevated at 69.  Assessment/Plan:    Principal Problem: Acute on chronic pancreatitis - Secondary to ongoing alcohol abuse - CT of abdomen on admission demonstrated pancreatitis multiple pseudocysts  - Diet advanced to full liquids today - Continue pancreatic lipase supplementation  - Continue pain management efforts   Active problems: Essential hypertension  - Continue prn hydralazine  Alcohol abuse, polysubstance abuse - Using tobacco, cocaine, THC, and ETOH - Counseled on cessation  - Nicotine patch ordered   Health care associated pneumonia - Chest x ray concerning for infiltrates so he was started on vanco and zosyn - Improving  Dizziness - Likely related to multiples sedatives.   Depression - Continue Seroquel  DVT Prophylaxis  - Heparin subQ ordered    Code Status: Full.  Family Communication:  plan of care discussed with the patient Disposition Plan: Home once by mouth intake improves. Likely by 05/24/2015.   IV access:  Peripheral IV  Procedures and diagnostic studies:    Dg Chest 2 View 05/21/2015  Resolution of pulmonary interstitial infiltrates since prior study. No active disease. Electronically Signed   By: Earle Gell M.D.   On: 05/21/2015 13:35   Dg Chest 2 View 05/19/2015  Coarsening of the interstitial markings in thickening of the interlobar fissures, suggestive of pulmonary vascular congestion.  Symmetric interstitial and alveolar infiltrates in bilateral lower lobes, which may be seen due to development of pulmonary edema or developing airspace consolidation. Electronically Signed   By: Fidela Salisbury M.D.   On: 05/19/2015 15:38   Ct Abdomen Pelvis W Contrast 05/18/2015  Pancreatitis with multiple pseudocysts. Apparent thickening of the rugal folds. Clinical correlation is recommended to evaluate for gastritis. No bowel obstruction. Normal appendix.   Medical Consultants:  None   Other Consultants:  None   IAnti-Infectives:   Vancomycin and Zosyn 05/20/2015 -->   Leisa Lenz, MD  [facility] Pager [phone]  Time spent in minutes: 25 minutes  If 7PM-7AM, please contact night-coverage [email or web address] Password [email or web address] 05/22/2015, 1:54 PM   LOS: 4 days    HPI/Subjective: No acute overnight events. Patient reports pain is about 5 out of 10 in intensity. No nausea or vomiting.  Objective: Filed Vitals:   05/21/15 0510 05/21/15 1400 05/21/15 2134 05/22/15 0529  BP: 119/72 122/68 127/93 122/88  Pulse: 119 94 114 118  Temp: 98.3 F (36.8 C) 98.7 F (37.1 C) 98.4 F (36.9 C) 98 F (36.7 C)  TempSrc: Oral Oral Oral Oral  Resp: 16 18 20 18   Height:      Weight:      SpO2: 98% 99% 99% 99%    Intake/Output Summary (Last 24 hours) at 05/22/15 1354 Last data filed at 05/22/15 1111  Gross per 24 hour  Intake    585 ml  Output   3000 ml  Net  -2415 ml    Exam:   General:  Pt is alert, follows commands appropriately, not in acute distress  Cardiovascular: Regular rate and rhythm,  S1/S2, no murmurs  Respiratory: Clear to auscultation bilaterally, no wheezing, no crackles, no rhonchi  Abdomen: Soft, tender in mid abdomen, no rebound  Extremities: No edema, pulses DP and PT palpable bilaterally  Neuro: Grossly nonfocal  Data Reviewed: Basic Metabolic Panel:  Recent Labs Lab 05/17/15 1938 05/19/15 0540 05/20/15 1054 05/21/15 0520 05/22/15 0530   NA 141 141 138 138 139  K 3.7 3.7 3.3* 3.6 3.6  CL 107 105 102 102 104  CO2 25 29 24 25 24   GLUCOSE 111* 143* 127* 129* 105*  BUN 7 <5* <5* <5* <5*  CREATININE 0.63 0.57* 0.73 0.60* 0.92  CALCIUM 9.0 8.5* 9.7 9.3 9.2   Liver Function Tests:  Recent Labs Lab 05/17/15 1938 05/19/15 0540  AST 43* 25  ALT 39 29  ALKPHOS 114 96  BILITOT 0.5 1.0  PROT 7.5 6.9  ALBUMIN 3.8 3.4*    Recent Labs Lab 05/17/15 1938  LIPASE 69*   No results for input(s): AMMONIA in the last 168 hours. CBC:  Recent Labs Lab 05/17/15 1938 05/19/15 0540 05/21/15 0520 05/22/15 0530  WBC 8.7 6.6 13.1* 14.9*  NEUTROABS 4.0  --   --   --   HGB 11.6* 11.0* 11.5* 11.3*  HCT 35.3* 34.8* 35.3* 34.7*  MCV 96.4 100.6* 97.2 97.7  PLT 200 188 182 213   Cardiac Enzymes: No results for input(s): CKTOTAL, CKMB, CKMBINDEX, TROPONINI in the last 168 hours. BNP: Invalid input(s): POCBNP CBG:  Recent Labs Lab 05/19/15 1442 05/21/15 1114  GLUCAP 91 121*    No results found for this or any previous visit (from the past 240 hour(s)).   Scheduled Meds: . amitriptyline  25 mg Oral QHS  . vitamin B-12  500 mcg Oral Daily  . fluticasone  1 spray Each Nare Daily  . folic acid  1 mg Oral Daily  . heparin  5,000 Units Subcutaneous 3 times per day  . lipase/protease/amylase  24,000 Units Oral TID WC  . multivitamin with minerals  1 tablet Oral Daily  . nicotine  21 mg Transdermal Daily  . oxyCODONE  20 mg Oral Q12H  . pantoprazole  40 mg Oral BID AC  . piperacillin-tazobactam (ZOSYN)  IV  3.375 g Intravenous Q8H  . QUEtiapine  100 mg Oral QHS  . sucralfate  1 g Oral TID WC & HS  . vancomycin  1,000 mg Intravenous Q8H   Continuous Infusions: . sodium chloride 50 mL/hr at 05/21/15 1420

## 2015-05-23 DIAGNOSIS — F17200 Nicotine dependence, unspecified, uncomplicated: Secondary | ICD-10-CM

## 2015-05-23 DIAGNOSIS — F329 Major depressive disorder, single episode, unspecified: Secondary | ICD-10-CM

## 2015-05-23 DIAGNOSIS — K860 Alcohol-induced chronic pancreatitis: Secondary | ICD-10-CM

## 2015-05-23 MED ORDER — METOPROLOL TARTRATE 25 MG PO TABS
25.0000 mg | ORAL_TABLET | Freq: Two times a day (BID) | ORAL | Status: DC
  Administered 2015-05-23 – 2015-05-25 (×5): 25 mg via ORAL
  Filled 2015-05-23 (×7): qty 1

## 2015-05-23 MED ORDER — LEVOFLOXACIN 750 MG PO TABS
750.0000 mg | ORAL_TABLET | Freq: Every day | ORAL | Status: DC
  Administered 2015-05-23 – 2015-05-25 (×3): 750 mg via ORAL
  Filled 2015-05-23 (×3): qty 1

## 2015-05-23 NOTE — Progress Notes (Signed)
Pharmacy Antibiotic Follow-up Note  [name] is a 45 y.o. year-old male admitted on 05/17/2015.  The patient is currently on day 4 of Vancomycin and Zosyn EI for HCAP.  Pharmacy consulted to transition patient to levaquin.   Assessment/Plan: WBC elevated, no labs today.  SCr also bumped up slightly since admission, CrCl ~92 ml/min.  Afebrile.  No culture data. Pt tolerating POs, oral intake improved, diet advanced this AM.  No abx allergies.  -Start levaquin 750mg  PO q24h -Need for further dosage adjustment appears unlikely at present.    Will sign off at this time.  Please reconsult if a change in clinical status warrants re-evaluation of dosage.  Temp (24hrs), Avg:98 F (36.7 C), Min:97.7 F (36.5 C), Max:98.4 F (36.9 C)   Recent Labs Lab 05/17/15 1938 05/19/15 0540 05/21/15 0520 05/22/15 0530  WBC 8.7 6.6 13.1* 14.9*     Recent Labs Lab 05/17/15 1938 05/19/15 0540 05/20/15 1054 05/21/15 0520 05/22/15 0530  CREATININE 0.63 0.57* 0.73 0.60* 0.92   Estimated Creatinine Clearance: 92.4 mL/min (by C-G formula based on Cr of 0.92).    Allergies  Allergen Reactions  . Shellfish-Derived Products Nausea And Vomiting  . Trazodone And Nefazodone Other (See Comments)    Muscle spasms  . Diazepam Other (See Comments)    Arm was jerking uncontrollably (patient denies this allergy and states that he can tolerate)    Antimicrobials this admission: 1/11 >> vanc >> 1/14 1/11 >> Zosyn >> 1/14 1/14 >> levaquin >>  Levels/dose changes this admission: 1/13 VT= 19 on 1gm Q8h (prior to 8th dose)  Microbiology results: None  Thank you for allowing pharmacy to be a part of this patient's care.  Ralene Bathe, PharmD, BCPS 05/23/2015, 9:09 AM  Pager: [phone]

## 2015-05-23 NOTE — Progress Notes (Signed)
Patient ID: [name], male   DOB: Nov 21, 1970, 45 y.o.   MRN: [id number] [facility] PROGRESS NOTE  [name] [id number] DOB: November 12, 1970 DOA: 05/17/2015 PCP: [name], NP  Brief narrative:    45 y.o. male with past medical history of hypertension, alcohol and polysubstance abuse, pancreatitis, suicidal attempts in past. He presents to [facility] 05/17/2015 for evaluation of worsening epigastric pain associated with nausea and poor by mouth intake.  On admission, patient was hemodynamically stable. His lipase was mildly elevated at 69.  Assessment/Plan:    Principal Problem: Acute on chronic pancreatitis - Secondary to alcohol abuse - CT of abdomen on admission demonstrated pancreatitis multiple pseudocysts  - Diet advanced to regular today - Pain better controlled today  - Continue pancreatic lipase supplementation   Active problems: Essential hypertension  - Resume metoprolol   Alcohol abuse, polysubstance abuse - Using tobacco, cocaine, THC, and ETOH - Counseled on cessation  - Nicotine patch order placed on admission   Health care associated pneumonia - Chest x ray concerning for infiltrates so he was started on vanco and zosyn - CXR repeated and looks like improving  - Change abx to Levaquin today (was on vanco and zosyn)   Dizziness - Probably related to multiples sedatives.   Depression - Continue Seroquel  DVT Prophylaxis  - Heparin subQ ordered while pt in hospital    Code Status: Full.  Family Communication:  plan of care discussed with the patient Disposition Plan: Home likely by 05/25/2015  IV access:  Peripheral IV  Procedures and diagnostic studies:    Dg Chest 2 View 05/21/2015  Resolution of pulmonary interstitial infiltrates since prior study. No active disease. Electronically Signed   By: Earle Gell M.D.   On: 05/21/2015 13:35   Dg Chest 2 View 05/19/2015  Coarsening of the interstitial markings in thickening of  the interlobar fissures, suggestive of pulmonary vascular congestion. Symmetric interstitial and alveolar infiltrates in bilateral lower lobes, which may be seen due to development of pulmonary edema or developing airspace consolidation. Electronically Signed   By: Fidela Salisbury M.D.   On: 05/19/2015 15:38   Ct Abdomen Pelvis W Contrast 05/18/2015  Pancreatitis with multiple pseudocysts. Apparent thickening of the rugal folds. Clinical correlation is recommended to evaluate for gastritis. No bowel obstruction. Normal appendix.   Medical Consultants:  None   Other Consultants:  None   IAnti-Infectives:   Vancomycin and Zosyn 05/20/2015 --> 05/23/2015 Levaquin 05/23/2015 -->   Leisa Lenz, MD  [facility] Pager [phone]  Time spent in minutes: 15 minutes  If 7PM-7AM, please contact night-coverage [email or web address] Password [email or web address] 05/23/2015, 2:12 PM   LOS: 5 days    HPI/Subjective: No acute overnight events. Patient reports pain is better.   Objective: Filed Vitals:   05/22/15 1448 05/22/15 2150 05/23/15 0645 05/23/15 1318  BP: 150/95 163/96 126/76 156/94  Pulse: 100 100 104 96  Temp: 97.7 F (36.5 C) 98.4 F (36.9 C) 97.8 F (36.6 C) 98.9 F (37.2 C)  TempSrc: Oral Oral Oral Oral  Resp: 18 18 18 18   Height:      Weight:      SpO2: 100% 100% 100% 100%    Intake/Output Summary (Last 24 hours) at 05/23/15 1412 Last data filed at 05/23/15 1319  Gross per 24 hour  Intake   1680 ml  Output   3350 ml  Net  -1670 ml    Exam:   General:  Pt is not in acute  distress  Cardiovascular: RRR, S1/S2 (+)  Respiratory: no wheezing, no crackles, no rhonchi  Abdomen: tender in mid abdomen, no rebound tenderness, no guarding   Extremities: No leg swelling, pulses palpable bilaterally  Neuro: Nonfocal  Data Reviewed: Basic Metabolic Panel:  Recent Labs Lab 05/17/15 1938 05/19/15 0540 05/20/15 1054 05/21/15 0520 05/22/15 0530  NA 141 141 138 138 139  K  3.7 3.7 3.3* 3.6 3.6  CL 107 105 102 102 104  CO2 25 29 24 25 24   GLUCOSE 111* 143* 127* 129* 105*  BUN 7 <5* <5* <5* <5*  CREATININE 0.63 0.57* 0.73 0.60* 0.92  CALCIUM 9.0 8.5* 9.7 9.3 9.2   Liver Function Tests:  Recent Labs Lab 05/17/15 1938 05/19/15 0540  AST 43* 25  ALT 39 29  ALKPHOS 114 96  BILITOT 0.5 1.0  PROT 7.5 6.9  ALBUMIN 3.8 3.4*    Recent Labs Lab 05/17/15 1938  LIPASE 69*   No results for input(s): AMMONIA in the last 168 hours. CBC:  Recent Labs Lab 05/17/15 1938 05/19/15 0540 05/21/15 0520 05/22/15 0530  WBC 8.7 6.6 13.1* 14.9*  NEUTROABS 4.0  --   --   --   HGB 11.6* 11.0* 11.5* 11.3*  HCT 35.3* 34.8* 35.3* 34.7*  MCV 96.4 100.6* 97.2 97.7  PLT 200 188 182 213   Cardiac Enzymes: No results for input(s): CKTOTAL, CKMB, CKMBINDEX, TROPONINI in the last 168 hours. BNP: Invalid input(s): POCBNP CBG:  Recent Labs Lab 05/19/15 1442 05/21/15 1114  GLUCAP 91 121*    No results found for this or any previous visit (from the past 240 hour(s)).   Scheduled Meds: . amitriptyline  25 mg Oral QHS  . vitamin B-12  500 mcg Oral Daily  . fluticasone  1 spray Each Nare Daily  . folic acid  1 mg Oral Daily  . heparin  5,000 Units Subcutaneous 3 times per day  . levofloxacin  750 mg Oral Daily  . lipase/protease/amylase  24,000 Units Oral TID WC  . multivitamin with minerals  1 tablet Oral Daily  . nicotine  21 mg Transdermal Daily  . oxyCODONE  20 mg Oral Q12H  . pantoprazole  40 mg Oral BID AC  . QUEtiapine  100 mg Oral QHS  . sucralfate  1 g Oral TID WC & HS   Continuous Infusions: . sodium chloride 50 mL/hr at 05/21/15 1420

## 2015-05-24 DIAGNOSIS — K863 Pseudocyst of pancreas: Secondary | ICD-10-CM

## 2015-05-24 DIAGNOSIS — D72829 Elevated white blood cell count, unspecified: Secondary | ICD-10-CM | POA: Insufficient documentation

## 2015-05-24 LAB — BASIC METABOLIC PANEL
Anion gap: 8 (ref 5–15)
BUN: 6 mg/dL (ref 6–20)
CALCIUM: 9.5 mg/dL (ref 8.9–10.3)
CHLORIDE: 108 mmol/L (ref 101–111)
CO2: 26 mmol/L (ref 22–32)
CREATININE: 1.06 mg/dL (ref 0.61–1.24)
GFR calc non Af Amer: >60 mL/min (ref >60)
GLUCOSE: 82 mg/dL (ref 65–99)
Potassium: 3.8 mmol/L (ref 3.5–5.1)
Sodium: 142 mmol/L (ref 135–145)

## 2015-05-24 LAB — CBC
HEMATOCRIT: 32.8 % — AB (ref 39.0–52.0)
Hemoglobin: 10.6 g/dL — ABNORMAL LOW (ref 13.0–17.0)
MCH: 31.4 pg (ref 26.0–34.0)
MCHC: 32.3 g/dL (ref 30.0–36.0)
MCV: 97 fL (ref 78.0–100.0)
Platelets: 234 10*3/uL (ref 150–400)
RBC: 3.38 MIL/uL — ABNORMAL LOW (ref 4.22–5.81)
RDW: 12.8 % (ref 11.5–15.5)
WBC: 7.5 10*3/uL (ref 4.0–10.5)

## 2015-05-24 LAB — MAGNESIUM: Magnesium: 1.8 mg/dL (ref 1.7–2.4)

## 2015-05-24 MED ORDER — MENTHOL 3 MG MT LOZG
1.0000 | LOZENGE | OROMUCOSAL | Status: DC | PRN
  Administered 2015-05-24: 3 mg via ORAL
  Filled 2015-05-24: qty 9

## 2015-05-24 NOTE — Progress Notes (Signed)
Patient ID: [name], male   DOB: 1970-11-18, 45 y.o.   MRN: [id number] [facility] PROGRESS NOTE  DAESHAUN ALLEGRETTO [id number] DOB: 05/02/1971 DOA: 05/17/2015 PCP: [name], NP  Brief narrative:    45 y.o. male with past medical history of hypertension, alcohol and polysubstance abuse, pancreatitis, suicidal attempts in past. He presents to [facility] 05/17/2015 for evaluation of worsening epigastric pain associated with nausea and poor by mouth intake.  On admission, patient was hemodynamically stable. His lipase was mildly elevated at 69.  Assessment/Plan:    Principal Problem: Acute on chronic pancreatitis / Leukocytosis  - Secondary to alcohol abuse - CT of abdomen on admission demonstrated pancreatitis multiple pseudocysts  - Diet as tolerated - Most likely will be ready for discharge tomorrow  - Continue pancreatic lipase supplementation   Active problems: Essential hypertension  - Continue metoprolol   Alcohol abuse, polysubstance abuse - Using tobacco, cocaine, THC, and ETOH - Counseled on cessation  - Nicotine patch ordered  Health care associated pneumonia - Chest x ray concerning for infiltrates so he was started on vanco and zosyn - CXR repeated and looks like improving  - Now on Levaquin    Dizziness - Probably related to multiples sedatives.   Depression - Continue Seroquel - Stable - Not depressed   DVT Prophylaxis  - Heparin subQ ordered    Code Status: Full.  Family Communication:  plan of care discussed with the patient Disposition Plan: Home likely by 05/25/2015  IV access:  Peripheral IV  Procedures and diagnostic studies:    Dg Chest 2 View 05/21/2015  Resolution of pulmonary interstitial infiltrates since prior study. No active disease. Electronically Signed   By: Earle Gell M.D.   On: 05/21/2015 13:35   Dg Chest 2 View 05/19/2015  Coarsening of the interstitial markings in thickening of the interlobar fissures,  suggestive of pulmonary vascular congestion. Symmetric interstitial and alveolar infiltrates in bilateral lower lobes, which may be seen due to development of pulmonary edema or developing airspace consolidation. Electronically Signed   By: Fidela Salisbury M.D.   On: 05/19/2015 15:38   Ct Abdomen Pelvis W Contrast 05/18/2015  Pancreatitis with multiple pseudocysts. Apparent thickening of the rugal folds. Clinical correlation is recommended to evaluate for gastritis. No bowel obstruction. Normal appendix.   Medical Consultants:  None   Other Consultants:  None   IAnti-Infectives:   Vancomycin and Zosyn 05/20/2015 --> 05/23/2015 Levaquin 05/23/2015 -->   Leisa Lenz, MD  [facility] Pager [phone]  Time spent in minutes: 15 minutes  If 7PM-7AM, please contact night-coverage [email or web address] Password [email or web address] 05/24/2015, 12:57 PM   LOS: 6 days    HPI/Subjective: No acute overnight events. Patient reports she has less pain today.   Objective: Filed Vitals:   05/23/15 0645 05/23/15 1318 05/23/15 2059 05/24/15 0500  BP: 126/76 156/94 153/99 130/75  Pulse: 104 96 88 85  Temp: 97.8 F (36.6 C) 98.9 F (37.2 C) 98.4 F (36.9 C) 98 F (36.7 C)  TempSrc: Oral Oral Oral Oral  Resp: 18 18 20 20   Height:      Weight:      SpO2: 100% 100% 100% 100%    Intake/Output Summary (Last 24 hours) at 05/24/15 1257 Last data filed at 05/24/15 1000  Gross per 24 hour  Intake   1770 ml  Output   2650 ml  Net   -880 ml    Exam:   General:  Pt is alert, awake  Cardiovascular: Rate controlled, (+) S1, S2   Respiratory: bilateral air entry, no wheezing   Abdomen: (+) BS, non tender   Extremities: No edema, palpable pulses   Neuro: No focal deficits   Data Reviewed: Basic Metabolic Panel:  Recent Labs Lab 05/19/15 0540 05/20/15 1054 05/21/15 0520 05/22/15 0530 05/24/15 0624  NA 141 138 138 139 142  K 3.7 3.3* 3.6 3.6 3.8  CL 105 102 102 104 108  CO2 29 24 25 24 26    GLUCOSE 143* 127* 129* 105* 82  BUN <5* <5* <5* <5* 6  CREATININE 0.57* 0.73 0.60* 0.92 1.06  CALCIUM 8.5* 9.7 9.3 9.2 9.5  MG  --   --   --   --  1.8   Liver Function Tests:  Recent Labs Lab 05/17/15 1938 05/19/15 0540  AST 43* 25  ALT 39 29  ALKPHOS 114 96  BILITOT 0.5 1.0  PROT 7.5 6.9  ALBUMIN 3.8 3.4*    Recent Labs Lab 05/17/15 1938  LIPASE 69*   No results for input(s): AMMONIA in the last 168 hours. CBC:  Recent Labs Lab 05/17/15 1938 05/19/15 0540 05/21/15 0520 05/22/15 0530 05/24/15 0624  WBC 8.7 6.6 13.1* 14.9* 7.5  NEUTROABS 4.0  --   --   --   --   HGB 11.6* 11.0* 11.5* 11.3* 10.6*  HCT 35.3* 34.8* 35.3* 34.7* 32.8*  MCV 96.4 100.6* 97.2 97.7 97.0  PLT 200 188 182 213 234   Cardiac Enzymes: No results for input(s): CKTOTAL, CKMB, CKMBINDEX, TROPONINI in the last 168 hours. BNP: Invalid input(s): POCBNP CBG:  Recent Labs Lab 05/19/15 1442 05/21/15 1114  GLUCAP 91 121*    No results found for this or any previous visit (from the past 240 hour(s)).   Scheduled Meds: . amitriptyline  25 mg Oral QHS  . vitamin B-12  500 mcg Oral Daily  . fluticasone  1 spray Each Nare Daily  . folic acid  1 mg Oral Daily  . heparin  5,000 Units Subcutaneous 3 times per day  . levofloxacin  750 mg Oral Daily  . lipase/protease/amylase  24,000 Units Oral TID WC  . metoprolol tartrate  25 mg Oral BID  . multivitamin with minerals  1 tablet Oral Daily  . nicotine  21 mg Transdermal Daily  . oxyCODONE  20 mg Oral Q12H  . pantoprazole  40 mg Oral BID AC  . QUEtiapine  100 mg Oral QHS  . sucralfate  1 g Oral TID WC & HS   Continuous Infusions: . sodium chloride 50 mL/hr at 05/24/15 [phone]

## 2015-05-25 DIAGNOSIS — R42 Dizziness and giddiness: Secondary | ICD-10-CM

## 2015-05-25 LAB — BASIC METABOLIC PANEL
ANION GAP: 10 (ref 5–15)
BUN: 5 mg/dL — AB (ref 6–20)
CALCIUM: 8.9 mg/dL (ref 8.9–10.3)
CO2: 24 mmol/L (ref 22–32)
Chloride: 104 mmol/L (ref 101–111)
Creatinine, Ser: 0.99 mg/dL (ref 0.61–1.24)
GFR calc Af Amer: >60 mL/min (ref >60)
GLUCOSE: 115 mg/dL — AB (ref 65–99)
Potassium: 3.3 mmol/L — ABNORMAL LOW (ref 3.5–5.1)
Sodium: 138 mmol/L (ref 135–145)

## 2015-05-25 LAB — CBC
HCT: 32.2 % — ABNORMAL LOW (ref 39.0–52.0)
Hemoglobin: 10.7 g/dL — ABNORMAL LOW (ref 13.0–17.0)
MCH: 32.3 pg (ref 26.0–34.0)
MCHC: 33.2 g/dL (ref 30.0–36.0)
MCV: 97.3 fL (ref 78.0–100.0)
PLATELETS: 276 10*3/uL (ref 150–400)
RBC: 3.31 MIL/uL — ABNORMAL LOW (ref 4.22–5.81)
RDW: 12.8 % (ref 11.5–15.5)
WBC: 10.1 10*3/uL (ref 4.0–10.5)

## 2015-05-25 MED ORDER — POTASSIUM CHLORIDE CRYS ER 20 MEQ PO TBCR: 40.0000 meq | EXTENDED_RELEASE_TABLET | Freq: Once | ORAL | Status: DC

## 2015-05-25 MED ORDER — PROMETHAZINE HCL 25 MG PO TABS: 25.0000 mg | ORAL_TABLET | Freq: Four times a day (QID) | ORAL | Status: DC | PRN

## 2015-05-25 MED ORDER — GI COCKTAIL ~~LOC~~: 30.0000 mL | Freq: Three times a day (TID) | ORAL | Status: DC | PRN

## 2015-05-25 MED ORDER — LEVOFLOXACIN 750 MG PO TABS: 750.0000 mg | ORAL_TABLET | Freq: Every day | ORAL | Status: DC

## 2015-05-25 MED ORDER — OXYCODONE-ACETAMINOPHEN 5-325 MG PO TABS: 1.0000 | ORAL_TABLET | ORAL | Status: DC | PRN

## 2015-05-25 NOTE — Discharge Instructions (Signed)
You have been seen today for abdominal pain with nausea and vomiting. Your imaging showed no abnormalities. Follow up with GI regarding the drop in your hemoglobin and possible blood in the stools. Follow up with PCP as needed. Return to ED should symptoms worsen. Tylenol may be used for pain. Acute Pancreatitis Acute pancreatitis is a disease in which the pancreas becomes suddenly inflamed. The pancreas is a large gland located behind your stomach. The pancreas produces enzymes that help digest food. The pancreas also releases the hormones glucagon and insulin that help regulate blood sugar. Damage to the pancreas occurs when the digestive enzymes from the pancreas are activated and begin attacking the pancreas before being released into the intestine. Most acute attacks last a couple of days and can cause serious complications. Some people become dehydrated and develop low blood pressure. In severe cases, bleeding into the pancreas can lead to shock and can be life-threatening. The lungs, heart, and kidneys may fail. CAUSES  Pancreatitis can happen to anyone. In some cases, the cause is unknown. Most cases are caused by:  Alcohol abuse.  Gallstones. Other less common causes are:  Certain medicines.  Exposure to certain chemicals.  Infection.  Damage caused by an accident (trauma).  Abdominal surgery. SYMPTOMS   Pain in the upper abdomen that may radiate to the back.  Tenderness and swelling of the abdomen.  Nausea and vomiting. DIAGNOSIS  Your caregiver will perform a physical exam. Blood and stool tests may be done to confirm the diagnosis. Imaging tests may also be done, such as X-rays, CT scans, or an ultrasound of the abdomen. TREATMENT  Treatment usually requires a stay in the hospital. Treatment may include:  Pain medicine.  Fluid replacement through an intravenous line (IV).  Placing a tube in the stomach to remove stomach contents and control vomiting.  Not eating for  3 or 4 days. This gives your pancreas a rest, because enzymes are not being produced that can cause further damage.  Antibiotic medicines if your condition is caused by an infection.  Surgery of the pancreas or gallbladder. HOME CARE INSTRUCTIONS   Follow the diet advised by your caregiver. This may involve avoiding alcohol and decreasing the amount of fat in your diet.  Eat smaller, more frequent meals. This reduces the amount of digestive juices the pancreas produces.  Drink enough fluids to keep your urine clear or pale yellow.  Only take over-the-counter or prescription medicines as directed by your caregiver.  Avoid drinking alcohol if it caused your condition.  Do not smoke.  Get plenty of rest.  Check your blood sugar at home as directed by your caregiver.  Keep all follow-up appointments as directed by your caregiver. SEEK MEDICAL CARE IF:   You do not recover as quickly as expected.  You develop new or worsening symptoms.  You have persistent pain, weakness, or nausea.  You recover and then have another episode of pain. SEEK IMMEDIATE MEDICAL CARE IF:   You are unable to eat or keep fluids down.  Your pain becomes severe.  You have a fever or persistent symptoms for more than 2 to 3 days.  You have a fever and your symptoms suddenly get worse.  Your skin or the white part of your eyes turn yellow (jaundice).  You develop vomiting.  You feel dizzy, or you faint.  Your blood sugar is high (over 300 mg/dL). MAKE SURE YOU:   Understand these instructions.  Will watch your condition.  Will get  help right away if you are not doing well or get worse.   This information is not intended to replace advice given to you by your health care provider. Make sure you discuss any questions you have with your health care provider.   Document Released: 04/25/2005 Document Revised: 10/25/2011 Document Reviewed: 08/04/2011 Elsevier Interactive Patient Education [employer]

## 2015-05-25 NOTE — Progress Notes (Signed)
Patient being discharged to home. Alert and oriented. Reviewed discharge education, information and medications. Patients medications from home were given back to patient, including omeprazole, phenergan, creon and carafate. Patient states having no questions at this time and understands. Patients discharged to wife by [facility].

## 2015-05-25 NOTE — Discharge Summary (Signed)
Physician Discharge Summary  [name] [id number] DOB: 14-Oct-1970 DOA: 05/17/2015  PCP: [name], NP  Admit date: 05/17/2015 Discharge date: 05/25/2015  Recommendations for Outpatient Follow-up:  1. Patient will continue Levaquin for 1 more day on discharge to complete treatment for pneumonia.  Discharge Diagnoses:  Principal Problem:   Alcohol-induced chronic pancreatitis ([place]) Active Problems:   TOBACCO ABUSE   Alcohol dependence ([place])   Essential hypertension   Polysubstance abuse (tobacco, cocaine, THC, and ETOH)   Pancreatitis   Pseudocyst of pancreas   Hospital acquired PNA   Leukocytosis    Discharge Condition: stable   Diet recommendation: as tolerated   History of present illness:  45 y.o. male with past medical history of hypertension, alcohol and polysubstance abuse, pancreatitis, suicidal attempts in past. He presents to [facility] 05/17/2015 for evaluation of worsening epigastric pain associated with nausea and poor by mouth intake.  On admission, patient was hemodynamically stable. His lipase was mildly elevated at 69.  Hospital Course:   Assessment/Plan:    Principal Problem: Acute on chronic pancreatitis / Leukocytosis  - Secondary to alcohol abuse - CT of abdomen on admission demonstrated pancreatitis multiple pseudocysts  - Tolerates diet well.  - Continue pancreatic lipase supplementation   Active problems: Essential hypertension  - Continue metoprolol   Alcohol abuse, polysubstance abuse - Using tobacco, cocaine, THC, and ETOH - Counseled on cessation  - Nicotine patch ordered in hospital   Health care associated pneumonia - Chest x ray concerning for infiltrates so he was started on vanco and zosyn - CXR repeated and looks like improving  - Levaquin for 1 more day on discharge, has received abx so far for 6 days.   Dizziness - Probably related to multiples sedatives.  - Resolved   Depression -  Continue Seroquel - Stable - Not depressed   DVT Prophylaxis  - Heparin subQ ordered while pt in hospital    Code Status: Full.  Family Communication: plan of care discussed with the patient   IV access:  Peripheral IV  Procedures and diagnostic studies:   Dg Chest 2 View 05/21/2015 Resolution of pulmonary interstitial infiltrates since prior study. No active disease. Electronically Signed By: Earle Gell M.D. On: 05/21/2015 13:35   Dg Chest 2 View 05/19/2015 Coarsening of the interstitial markings in thickening of the interlobar fissures, suggestive of pulmonary vascular congestion. Symmetric interstitial and alveolar infiltrates in bilateral lower lobes, which may be seen due to development of pulmonary edema or developing airspace consolidation. Electronically Signed By: Fidela Salisbury M.D. On: 05/19/2015 15:38   Ct Abdomen Pelvis W Contrast 05/18/2015 Pancreatitis with multiple pseudocysts. Apparent thickening of the rugal folds. Clinical correlation is recommended to evaluate for gastritis. No bowel obstruction. Normal appendix.   Medical Consultants:  None   Other Consultants:  None   IAnti-Infectives:   Vancomycin and Zosyn 05/20/2015 --> 05/23/2015 Levaquin 05/23/2015 --> for 1 more day on discharge    Signed:  Leisa Lenz, MD  [facility] 05/25/2015, 10:14 AM  Pager #: [phone]  Time spent in minutes: less than 30 minutes  Discharge Exam: Filed Vitals:   05/25/15 0544 05/25/15 0946  BP: 121/65 140/90  Pulse: 97   Temp: 98.4 F (36.9 C)   Resp: 18    Filed Vitals:   05/24/15 1436 05/24/15 2115 05/25/15 0544 05/25/15 0946  BP: 137/93 147/98 121/65 140/90  Pulse: 81 94 97   Temp: 97.8 F (36.6 C) 98.8 F (37.1 C) 98.4 F (36.9 C)  TempSrc: Oral Oral Oral   Resp: 20 18 18    Height:      Weight:      SpO2: 100% 100% 97%     General: Pt is alert, follows commands appropriately, not in acute  distress Cardiovascular: Regular rate and rhythm, S1/S2 +, no murmurs Respiratory: Clear to auscultation bilaterally, no wheezing, no crackles, no rhonchi Abdominal: Soft, non tender, non distended, bowel sounds +, no guarding Extremities: no edema, no cyanosis, pulses palpable bilaterally DP and PT Neuro: Grossly nonfocal  Discharge Instructions  Discharge Instructions    Call MD for:  difficulty breathing, headache or visual disturbances    Complete by:  As directed      Call MD for:  persistant dizziness or light-headedness    Complete by:  As directed      Call MD for:  persistant nausea and vomiting    Complete by:  As directed      Call MD for:  severe uncontrolled pain    Complete by:  As directed      Diet - low sodium heart healthy    Complete by:  As directed      Discharge instructions    Complete by:  As directed   1. Take Levaquin for 1 more day on discharge to complete treatment for pneumonia. Patient took 6 days on antibiotics in hospital     Increase activity slowly    Complete by:  As directed             Medication List    STOP taking these medications        alum & mag hydroxide-simeth 400-400-40 MG/5ML suspension  Commonly known as:  MYLANTA DOUBLE-STRENGTH     sertraline 25 MG tablet  Commonly known as:  ZOLOFT      TAKE these medications        albuterol 108 (90 Base) MCG/ACT inhaler  Commonly known as:  PROVENTIL HFA;VENTOLIN HFA  Inhale 2 puffs into the lungs every 6 (six) hours as needed for wheezing or shortness of breath (wheezing).     amitriptyline 25 MG tablet  Commonly known as:  ELAVIL  Take 25 mg by mouth at bedtime.     cyanocobalamin 500 MCG tablet  Take 1 tablet (500 mcg total) by mouth daily.     folic acid 1 MG tablet  Commonly known as:  FOLVITE  Take 1 tablet (1 mg total) by mouth daily.     gi cocktail Susp suspension  Take 30 mLs by mouth 3 (three) times daily as needed for indigestion. Shake well.     levofloxacin  750 MG tablet  Commonly known as:  LEVAQUIN  Take 1 tablet (750 mg total) by mouth daily.     lipase/protease/amylase 12000 units Cpep capsule  Commonly known as:  CREON  Take 2 capsules (24,000 Units total) by mouth 3 (three) times daily with meals.     metoprolol tartrate 25 MG tablet  Commonly known as:  LOPRESSOR  Take 1 tablet (25 mg total) by mouth 2 (two) times daily.     multivitamin capsule  Take 1 capsule by mouth daily.     omeprazole 20 MG capsule  Commonly known as:  PRILOSEC  Take 1 capsule (20 mg total) by mouth daily.     ondansetron 4 MG disintegrating tablet  Commonly known as:  ZOFRAN ODT  Take 1 tablet (4 mg total) by mouth every 8 (eight) hours as needed for nausea or vomiting.  oxyCODONE-acetaminophen 5-325 MG tablet  Commonly known as:  PERCOCET/ROXICET  Take 1 tablet by mouth every 4 (four) hours as needed for severe pain.     promethazine 25 MG tablet  Commonly known as:  PHENERGAN  Take 1 tablet (25 mg total) by mouth every 6 (six) hours as needed for nausea or vomiting.     QUEtiapine 50 MG tablet  Commonly known as:  SEROQUEL  Take 100 mg by mouth at bedtime.     sildenafil 50 MG tablet  Commonly known as:  VIAGRA  Take 1 tablet (50 mg total) by mouth daily as needed for erectile dysfunction.     sucralfate 1 g tablet  Commonly known as:  CARAFATE  Take 1 tablet (1 g total) by mouth 4 (four) times daily -  with meals and at bedtime.           Follow-up Information    Follow up with [facility].   Why:  As soon as possible for follow up, For chronic management of this issue   Contact information:   [place], [place] [address] Phone: [phone]      Follow up with [facility].   Specialty:  Emergency Medicine   Why:  As needed, If symptoms worsen   Contact information:   [address] [id number] [place] [phone]      Follow up  with [name], NP.   Why:  As needed   Contact information:   [place] [address] [phone]        The results of significant diagnostics from this hospitalization (including imaging, microbiology, ancillary and laboratory) are listed below for reference.    Significant Diagnostic Studies: Dg Chest 2 View  05/21/2015  CLINICAL DATA:  Cough.  Pneumonia.  Pancreatitis. EXAM: CHEST  2 VIEW COMPARISON:  05/19/2015 FINDINGS: Heart size remains normal. Diffuse interstitial infiltrates have resolved since previous study. No evidence of pulmonary infiltrate or pleural effusion. IMPRESSION: Resolution of pulmonary interstitial infiltrates since prior study. No active disease. Electronically Signed   By: Earle Gell M.D.   On: 05/21/2015 13:35   Dg Chest 2 View  05/19/2015  CLINICAL DATA:  Shortness of breath and productive cough. EXAM: CHEST  2 VIEW COMPARISON:  03/05/2015 FINDINGS: Cardiomediastinal silhouette is normal. Mediastinal contours appear intact. There has been interval development of alveolar and interstitial infiltrates within bilateral lower lobes. Coarsening of the interstitial markings and thickening of the interlobar fissures are also seen. There is no evidence of pleural effusion or pneumothorax. Osseous structures are without acute abnormality. Soft tissues are grossly normal. IMPRESSION: Coarsening of the interstitial markings in thickening of the interlobar fissures, suggestive of pulmonary vascular congestion. Symmetric interstitial and alveolar infiltrates in bilateral lower lobes, which may be seen due to development of pulmonary edema or developing airspace consolidation. Electronically Signed   By: Fidela Salisbury M.D.   On: 05/19/2015 15:38   Ct Abdomen Pelvis W Contrast  05/18/2015  CLINICAL DATA:  45 year old male with left upper quadrant abdominal pain. History of pancreatitis. EXAM: CT ABDOMEN AND PELVIS WITH CONTRAST TECHNIQUE: Multidetector  CT imaging of the abdomen and pelvis was performed using the standard protocol following bolus administration of intravenous contrast. CONTRAST:  152mL OMNIPAQUE IOHEXOL 300 MG/ML  SOLN COMPARISON:  CT dated 04/24/2015 FINDINGS: The visualized lung bases are clear. No intra-abdominal free air or free fluid. There is inflammatory changes of the pancreas with  peripancreatic stranding most prominent involving the head and uncinate process compatible with known pancreatitis. Multiple pancreatic and peripancreatic hypodense lesions are compatible with known pseudocysts. The largest such cyst measures 2.2 x 1.7 cm at the head of the pancreas (previously measured 1.9 x 1.5 cm). A 1.4 x 1.4 cm hypodense lesion at the pancreatic tail previously measured 1.6 x 1.6 cm. The liver, gallbladder, spleen, adrenal glands, kidneys, visualized ureters, and urinary bladder appear unremarkable. The prostate and seminal vesicles are grossly unremarkable. There is apparent thickening of the gastric rugal folds. Correlation with clinical exam is recommended to evaluate for gastritis. Moderate stool throughout the colon. There is no evidence of bowel obstruction. Normal appendix. Mild aortoiliac atherosclerotic disease. The origins of the celiac axis, SMA, IMA as well as the origins of the renal arteries are patent. The SMV, splenic vein, and main portal veins are patent. No portal venous gas identified. There is a mild periportal edema. No adenopathy. Small fat containing umbilical hernia. The abdominal wall soft tissues appear unremarkable. The osseus structures are intact. IMPRESSION: Pancreatitis with multiple pseudocysts as described. Apparent thickening of the rugal folds. Clinical correlation is recommended to evaluate for gastritis. No bowel obstruction. Normal appendix. Electronically Signed   By: Anner Crete M.D.   On: 05/18/2015 01:09    Microbiology: No results found for this or any previous visit (from the past 240  hour(s)).   Labs: Basic Metabolic Panel:  Recent Labs Lab 05/20/15 1054 05/21/15 0520 05/22/15 0530 05/24/15 0624 05/25/15 0418  NA 138 138 139 142 138  K 3.3* 3.6 3.6 3.8 3.3*  CL 102 102 104 108 104  CO2 24 25 24 26 24   GLUCOSE 127* 129* 105* 82 115*  BUN <5* <5* <5* 6 5*  CREATININE 0.73 0.60* 0.92 1.06 0.99  CALCIUM 9.7 9.3 9.2 9.5 8.9  MG  --   --   --  1.8  --    Liver Function Tests:  Recent Labs Lab 05/19/15 0540  AST 25  ALT 29  ALKPHOS 96  BILITOT 1.0  PROT 6.9  ALBUMIN 3.4*   No results for input(s): LIPASE, AMYLASE in the last 168 hours. No results for input(s): AMMONIA in the last 168 hours. CBC:  Recent Labs Lab 05/19/15 0540 05/21/15 0520 05/22/15 0530 05/24/15 0624 05/25/15 0418  WBC 6.6 13.1* 14.9* 7.5 10.1  HGB 11.0* 11.5* 11.3* 10.6* 10.7*  HCT 34.8* 35.3* 34.7* 32.8* 32.2*  MCV 100.6* 97.2 97.7 97.0 97.3  PLT 188 182 213 234 276   Cardiac Enzymes: No results for input(s): CKTOTAL, CKMB, CKMBINDEX, TROPONINI in the last 168 hours. BNP: BNP (last 3 results)  Recent Labs  05/20/15 1054  BNP 128.0*    ProBNP (last 3 results) No results for input(s): PROBNP in the last 8760 hours.  CBG:  Recent Labs Lab 05/19/15 1442 05/21/15 1114  GLUCAP 91 121*

## 2015-06-08 ENCOUNTER — Emergency Department (HOSPITAL_COMMUNITY)
Admission: EM | Admit: 2015-06-08 | Discharge: 2015-06-08 | Disposition: A | Discharge status: Home / Self Care | Payer: Self-pay | Attending: Emergency Medicine | Admitting: Emergency Medicine

## 2015-06-08 ENCOUNTER — Emergency Department (HOSPITAL_COMMUNITY): Payer: Self-pay

## 2015-06-08 ENCOUNTER — Encounter (HOSPITAL_COMMUNITY): Payer: Self-pay | Admitting: Emergency Medicine

## 2015-06-08 DIAGNOSIS — M1990 Unspecified osteoarthritis, unspecified site: Secondary | ICD-10-CM | POA: Insufficient documentation

## 2015-06-08 DIAGNOSIS — Z8701 Personal history of pneumonia (recurrent): Secondary | ICD-10-CM | POA: Insufficient documentation

## 2015-06-08 DIAGNOSIS — F319 Bipolar disorder, unspecified: Secondary | ICD-10-CM | POA: Insufficient documentation

## 2015-06-08 DIAGNOSIS — Z79899 Other long term (current) drug therapy: Secondary | ICD-10-CM | POA: Insufficient documentation

## 2015-06-08 DIAGNOSIS — E782 Mixed hyperlipidemia: Secondary | ICD-10-CM | POA: Insufficient documentation

## 2015-06-08 DIAGNOSIS — J45909 Unspecified asthma, uncomplicated: Secondary | ICD-10-CM | POA: Insufficient documentation

## 2015-06-08 DIAGNOSIS — K219 Gastro-esophageal reflux disease without esophagitis: Secondary | ICD-10-CM | POA: Insufficient documentation

## 2015-06-08 DIAGNOSIS — R1084 Generalized abdominal pain: Secondary | ICD-10-CM | POA: Insufficient documentation

## 2015-06-08 DIAGNOSIS — I10 Essential (primary) hypertension: Secondary | ICD-10-CM | POA: Insufficient documentation

## 2015-06-08 DIAGNOSIS — R011 Cardiac murmur, unspecified: Secondary | ICD-10-CM | POA: Insufficient documentation

## 2015-06-08 DIAGNOSIS — G8929 Other chronic pain: Secondary | ICD-10-CM | POA: Insufficient documentation

## 2015-06-08 DIAGNOSIS — F17210 Nicotine dependence, cigarettes, uncomplicated: Secondary | ICD-10-CM | POA: Insufficient documentation

## 2015-06-08 DIAGNOSIS — F419 Anxiety disorder, unspecified: Secondary | ICD-10-CM | POA: Insufficient documentation

## 2015-06-08 DIAGNOSIS — D649 Anemia, unspecified: Secondary | ICD-10-CM | POA: Insufficient documentation

## 2015-06-08 LAB — BASIC METABOLIC PANEL
ANION GAP: 9 (ref 5–15)
BUN: 7 mg/dL (ref 6–20)
CALCIUM: 8.5 mg/dL — AB (ref 8.9–10.3)
CO2: 26 mmol/L (ref 22–32)
CREATININE: 0.66 mg/dL (ref 0.61–1.24)
Chloride: 109 mmol/L (ref 101–111)
Glucose, Bld: 98 mg/dL (ref 65–99)
Potassium: 3.5 mmol/L (ref 3.5–5.1)
SODIUM: 144 mmol/L (ref 135–145)

## 2015-06-08 LAB — ETHANOL

## 2015-06-08 LAB — CBC WITH DIFFERENTIAL/PLATELET
BASOS ABS: 0 10*3/uL (ref 0.0–0.1)
BASOS PCT: 0 %
EOS ABS: 0.9 10*3/uL — AB (ref 0.0–0.7)
EOS PCT: 10 %
HEMATOCRIT: 33 % — AB (ref 39.0–52.0)
Hemoglobin: 10.7 g/dL — ABNORMAL LOW (ref 13.0–17.0)
Lymphocytes Relative: 34 %
Lymphs Abs: 3.1 10*3/uL (ref 0.7–4.0)
MCH: 30.8 pg (ref 26.0–34.0)
MCHC: 32.4 g/dL (ref 30.0–36.0)
MCV: 95.1 fL (ref 78.0–100.0)
MONO ABS: 0.8 10*3/uL (ref 0.1–1.0)
MONOS PCT: 8 %
NEUTROS ABS: 4.2 10*3/uL (ref 1.7–7.7)
Neutrophils Relative %: 48 %
PLATELETS: 292 10*3/uL (ref 150–400)
RBC: 3.47 MIL/uL — ABNORMAL LOW (ref 4.22–5.81)
RDW: 13.4 % (ref 11.5–15.5)
WBC: 9 10*3/uL (ref 4.0–10.5)

## 2015-06-08 LAB — URINALYSIS, ROUTINE W REFLEX MICROSCOPIC
BILIRUBIN URINE: NEGATIVE
Glucose, UA: NEGATIVE mg/dL
HGB URINE DIPSTICK: NEGATIVE
Ketones, ur: NEGATIVE mg/dL
Leukocytes, UA: NEGATIVE
NITRITE: NEGATIVE
PROTEIN: NEGATIVE mg/dL
Specific Gravity, Urine: 1.01 (ref 1.005–1.030)
pH: 6 (ref 5.0–8.0)

## 2015-06-08 LAB — HEPATIC FUNCTION PANEL
ALBUMIN: 3.1 g/dL — AB (ref 3.5–5.0)
ALT: 19 U/L (ref 17–63)
AST: 36 U/L (ref 15–41)
Alkaline Phosphatase: 83 U/L (ref 38–126)
BILIRUBIN TOTAL: 0.4 mg/dL (ref 0.3–1.2)
Bilirubin, Direct: 0.1 mg/dL (ref 0.1–0.5)
Indirect Bilirubin: 0.3 mg/dL (ref 0.3–0.9)
Total Protein: 6.6 g/dL (ref 6.5–8.1)

## 2015-06-08 LAB — LIPASE, BLOOD: LIPASE: 27 U/L (ref 11–51)

## 2015-06-08 MED ORDER — HYDROMORPHONE HCL 1 MG/ML IJ SOLN
1.0000 mg | Freq: Once | INTRAMUSCULAR | Status: AC
  Administered 2015-06-08: 1 mg via INTRAVENOUS
  Filled 2015-06-08: qty 1

## 2015-06-08 MED ORDER — SODIUM CHLORIDE 0.9 % IV BOLUS (SEPSIS)
1000.0000 mL | Freq: Once | INTRAVENOUS | Status: AC
  Administered 2015-06-08: 1000 mL via INTRAVENOUS

## 2015-06-08 MED ORDER — HYDROCODONE-HOMATROPINE 5-1.5 MG/5ML PO SYRP: 5.0000 mL | ORAL_SOLUTION | Freq: Four times a day (QID) | ORAL | Status: DC | PRN

## 2015-06-08 MED ORDER — GI COCKTAIL ~~LOC~~
30.0000 mL | Freq: Once | ORAL | Status: AC
  Administered 2015-06-08: 30 mL via ORAL
  Filled 2015-06-08: qty 30

## 2015-06-08 MED ORDER — ONDANSETRON HCL 4 MG/2ML IJ SOLN
4.0000 mg | Freq: Once | INTRAMUSCULAR | Status: AC
  Administered 2015-06-08: 4 mg via INTRAVENOUS
  Filled 2015-06-08: qty 2

## 2015-06-08 MED ORDER — HYDROMORPHONE HCL 1 MG/ML IJ SOLN
2.0000 mg | Freq: Once | INTRAMUSCULAR | Status: AC
  Administered 2015-06-08: 2 mg via INTRAVENOUS
  Filled 2015-06-08: qty 2

## 2015-06-08 NOTE — ED Notes (Signed)
Per EMS, called to pt home for c/o abdominal pain that radiates to the left side and around the the lower back.  Pt reports that he was discharged from the hospital for pneumonia on 05/24/15.

## 2015-06-08 NOTE — Discharge Instructions (Signed)

## 2015-06-08 NOTE — ED Notes (Signed)
Patient transported to X-ray 

## 2015-06-08 NOTE — ED Provider Notes (Signed)
CSN: [id number]     Arrival date & time 06/08/15  0159 History  By signing my name below, I, Terrance Branch, attest that this documentation has been prepared under the direction and in the presence of Orpah Greek, MD. Electronically Signed: Randa Evens, ED Scribe. 06/08/2015. 5:20 AM.     No chief complaint on file.  HPI HPI Comments: [name] is a 45 y.o. male who presents to the Emergency Department complaining of abdominal pain onset 1 night prior. Pt states that he has associated nausea, vomiting and diarrhea. Pt states that he has tried his all of his prescribed medications with no relief. He states that the pain intermittently radiates to back and chest. Pt states that he has a Hx of pancreatis and this pain feels similar to his previous pain.   Past Medical History  Diagnosis Date  . Hypertension   . Asthma   . Pancreatitis   . Cocaine abuse   . Depression   . H/O suicide attempt 10/2012  . Heart murmur     "when he was little" (03/06/2013)  . Anemia   . H/O hiatal hernia   . GERD (gastroesophageal reflux disease)   . Anxiety   . WPW (Wolff-Parkinson-White syndrome)     [name] 03/06/2013  . High cholesterol   . Femoral condyle fracture ([place]) 03/08/2014    left medial/notes 03/09/2014  . Alcoholism /alcohol abuse ([place])   . Family history of adverse reaction to anesthesia     "grandmother gets confused"  . Shortness of breath     "can happen at anytime" (03/06/2013)  . Pneumonia 1990's X 3  . Chronic bronchitis ([place])   . Sickle cell trait ([place])   . History of blood transfusion 10/2012    "when I tried to commit suicide"  . History of stomach ulcers   . Migraine     "a few times/year" (03/26/2015)  . Arthritis     "knees; arms; elbows" (03/26/2015)  . Chronic lower back pain   . Bipolar disorder ([place])   . PTSD (post-traumatic stress disorder)    Past Surgical History  Procedure Laterality Date  . Facial fracture surgery Left 1990's    "result  of trauma"   . Eye surgery Left 1990's    "result of trauma"   . Left heart catheterization with coronary angiogram Right 03/07/2013    Procedure: LEFT HEART CATHETERIZATION WITH CORONARY ANGIOGRAM;  Surgeon: [name], MD;  Location: [facility];  Service: Cardiovascular;  Laterality: Right;  . Cardiac catheterization    . Fracture surgery    . Umbilical hernia repair    . Hernia repair     Family History  Problem Relation Age of Onset  . Hypertension Other   . Coronary artery disease Other    Social History  Substance Use Topics  . Smoking status: Current Every Day Smoker -- 1.00 packs/day for 30 years    Types: Cigarettes  . Smokeless tobacco: Current User    Types: Chew  . Alcohol Use: 37.8 oz/week    63 Cans of beer per week     Comment: last drink was 04/27/15    Review of Systems    Allergies  Shellfish-derived products and Trazodone and nefazodone  Home Medications   Prior to Admission medications   Medication Sig Start Date End Date Taking? Authorizing Provider  albuterol (PROVENTIL HFA;VENTOLIN HFA) 108 (90 BASE) MCG/ACT inhaler Inhale 2 puffs into the lungs every 6 (six) hours as  needed for wheezing or shortness of breath (wheezing).    Yes Historical Provider, MD  Alum & Mag Hydroxide-Simeth (GI COCKTAIL) SUSP suspension Take 30 mLs by mouth 3 (three) times daily as needed for indigestion. Shake well. 05/25/15  Yes [name], MD  folic acid (FOLVITE) 1 MG tablet Take 1 tablet (1 mg total) by mouth daily. 01/15/15  Yes [name], MD  lipase/protease/amylase (CREON) 12000 UNITS CPEP capsule Take 2 capsules (24,000 Units total) by mouth 3 (three) times daily with meals. 01/15/15  Yes [name], MD  metoprolol tartrate (LOPRESSOR) 25 MG tablet Take 1 tablet (25 mg total) by mouth 2 (two) times daily. 01/15/15  Yes [name], MD  Multiple Vitamin (MULTIVITAMIN) capsule Take 1 capsule by mouth daily. 02/06/14  Yes [name], MD   omeprazole (PRILOSEC) 20 MG capsule Take 1 capsule (20 mg total) by mouth daily. 04/20/15  Yes [name], PA-C  oxyCODONE-acetaminophen (PERCOCET/ROXICET) 5-325 MG tablet Take 1 tablet by mouth every 4 (four) hours as needed for severe pain. 05/25/15  Yes [name], MD  promethazine (PHENERGAN) 25 MG tablet Take 1 tablet (25 mg total) by mouth every 6 (six) hours as needed for nausea or vomiting. 05/25/15  Yes [name], MD  QUEtiapine (SEROQUEL) 50 MG tablet Take 100 mg by mouth at bedtime. 01/15/15  Yes Historical Provider, MD  sucralfate (CARAFATE) 1 G tablet Take 1 tablet (1 g total) by mouth 4 (four) times daily -  with meals and at bedtime. 03/15/15  Yes Shari Upstill, PA-C  vitamin B-12 500 MCG tablet Take 1 tablet (500 mcg total) by mouth daily. 03/30/15  Yes [name], MD  levofloxacin (LEVAQUIN) 750 MG tablet Take 1 tablet (750 mg total) by mouth daily. Patient not taking: Reported on 06/08/2015 05/25/15   [name], MD  ondansetron (ZOFRAN ODT) 4 MG disintegrating tablet Take 1 tablet (4 mg total) by mouth every 8 (eight) hours as needed for nausea or vomiting. Patient not taking: Reported on 06/08/2015 05/18/15   Shawn C Joy, PA-C  sildenafil (VIAGRA) 50 MG tablet Take 1 tablet (50 mg total) by mouth daily as needed for erectile dysfunction. 03/31/15   [name], MD   BP 119/78 mmHg  Pulse 99  Temp(Src) 97.9 F (36.6 C) (Oral)  Resp 17  SpO2 94%   Physical Exam  Constitutional: He is oriented to person, place, and time. He appears well-developed and well-nourished. No distress.  HENT:  Head: Normocephalic and atraumatic.  Right Ear: Hearing normal.  Left Ear: Hearing normal.  Nose: Nose normal.  Mouth/Throat: Oropharynx is clear and moist. Mucous membranes are dry.  Eyes: Conjunctivae and EOM are normal. Pupils are equal, round, and reactive to light.  Neck: Normal range of motion. Neck supple.  Cardiovascular: Regular rhythm, S1 normal and S2  normal.  Exam reveals no gallop and no friction rub.   No murmur heard. Pulmonary/Chest: Effort normal and breath sounds normal. No respiratory distress. He exhibits no tenderness.  Abdominal: Soft. Normal appearance and bowel sounds are normal. There is no hepatosplenomegaly. There is tenderness in the epigastric area and left upper quadrant. There is no rebound, no guarding, no tenderness at McBurney's point and negative Murphy's sign. No hernia.  Musculoskeletal: Normal range of motion.  Neurological: He is alert and oriented to person, place, and time. He has normal strength. No cranial nerve deficit or sensory deficit. Coordination normal. GCS eye subscore is 4. GCS  verbal subscore is 5. GCS motor subscore is 6.  Skin: Skin is warm, dry and intact. No rash noted. No cyanosis.  Psychiatric: He has a normal mood and affect. His speech is normal and behavior is normal. Thought content normal.  Nursing note and vitals reviewed.   ED Course  Procedures (including critical care time) DIAGNOSTIC STUDIES:   COORDINATION OF CARE:    Labs Review Labs Reviewed  CBC WITH DIFFERENTIAL/PLATELET - Abnormal; Notable for the following:    RBC 3.47 (*)    Hemoglobin 10.7 (*)    HCT 33.0 (*)    Eosinophils Absolute 0.9 (*)    All other components within normal limits  BASIC METABOLIC PANEL - Abnormal; Notable for the following:    Calcium 8.5 (*)    All other components within normal limits  HEPATIC FUNCTION PANEL - Abnormal; Notable for the following:    Albumin 3.1 (*)    All other components within normal limits  LIPASE, BLOOD  URINALYSIS, ROUTINE W REFLEX MICROSCOPIC (NOT AT [facility])  ETHANOL    Imaging Review Dg Abd Acute W/chest  06/08/2015  CLINICAL DATA:  Left-sided abdominal pain with nausea and vomiting EXAM: DG ABDOMEN ACUTE W/ 1V CHEST COMPARISON:  Chest x-ray 05/21/2015.  Abdominal CT 05/18/2015. FINDINGS: There is no evidence of dilated bowel loops or free intraperitoneal air.  No radiopaque calculi or other significant radiographic abnormality is seen. Heart size and mediastinal contours are within normal limits. Both lungs are clear. Lumbosacral degenerative disc disease. IMPRESSION: Negative abdominal radiographs.  No acute cardiopulmonary disease. Electronically Signed   By: Monte Fantasia M.D.   On: 06/08/2015 03:28      EKG Interpretation   Date/Time:  Monday June 08 2015 02:09:58 EST Ventricular Rate:  102 PR Interval:  115 QRS Duration: 75 QT Interval:  282 QTC Calculation: 367 R Axis:   69 Text Interpretation:  Sinus tachycardia Left atrial enlargement  Nonspecific T abnrm, anterolateral leads No significant change since last  tracing Confirmed by POLLINA  MD, CHRISTOPHER [phone]) on 06/08/2015 2:39:58  AM      MDM   Final diagnoses:  None   chronic abdominal pain  Patient presents to the emergency department for evaluation of abdominal pain. Patient reports that he has a history of chronic pancreatitis. He was recently hospitalized for similar complaints. Looking at his records, however, reveals that her hospital stay was likely secondary to pneumonia. His pneumonia has cleared. He did have a mildly elevated lipase at recent admission. His lipase today is normal. His vital signs are stable. I do not see any indication for admission at this time. Patient administered IV fluids because he did seem to be mildly dehydrated. He was treated with analgesia. Patient will be discharged to follow-up with his primary doctor.  I personally performed the services described in this documentation, which was scribed in my presence. The recorded information has been reviewed and is accurate.      Orpah Greek, MD 06/08/15 [phone]

## 2015-06-11 ENCOUNTER — Encounter (HOSPITAL_COMMUNITY): Payer: Self-pay | Admitting: Emergency Medicine

## 2015-06-11 ENCOUNTER — Emergency Department (HOSPITAL_COMMUNITY)
Admission: EM | Admit: 2015-06-11 | Discharge: 2015-06-11 | Disposition: A | Discharge status: Home / Self Care | Payer: Self-pay | Attending: Emergency Medicine | Admitting: Emergency Medicine

## 2015-06-11 DIAGNOSIS — K219 Gastro-esophageal reflux disease without esophagitis: Secondary | ICD-10-CM | POA: Insufficient documentation

## 2015-06-11 DIAGNOSIS — Z915 Personal history of self-harm: Secondary | ICD-10-CM | POA: Insufficient documentation

## 2015-06-11 DIAGNOSIS — J45909 Unspecified asthma, uncomplicated: Secondary | ICD-10-CM | POA: Insufficient documentation

## 2015-06-11 DIAGNOSIS — R011 Cardiac murmur, unspecified: Secondary | ICD-10-CM | POA: Insufficient documentation

## 2015-06-11 DIAGNOSIS — R1012 Left upper quadrant pain: Secondary | ICD-10-CM | POA: Insufficient documentation

## 2015-06-11 DIAGNOSIS — F4310 Post-traumatic stress disorder, unspecified: Secondary | ICD-10-CM | POA: Insufficient documentation

## 2015-06-11 DIAGNOSIS — F319 Bipolar disorder, unspecified: Secondary | ICD-10-CM | POA: Insufficient documentation

## 2015-06-11 DIAGNOSIS — Z8781 Personal history of (healed) traumatic fracture: Secondary | ICD-10-CM | POA: Insufficient documentation

## 2015-06-11 DIAGNOSIS — I10 Essential (primary) hypertension: Secondary | ICD-10-CM | POA: Insufficient documentation

## 2015-06-11 DIAGNOSIS — Z79899 Other long term (current) drug therapy: Secondary | ICD-10-CM | POA: Insufficient documentation

## 2015-06-11 DIAGNOSIS — Z8701 Personal history of pneumonia (recurrent): Secondary | ICD-10-CM | POA: Insufficient documentation

## 2015-06-11 DIAGNOSIS — R197 Diarrhea, unspecified: Secondary | ICD-10-CM | POA: Insufficient documentation

## 2015-06-11 DIAGNOSIS — G43909 Migraine, unspecified, not intractable, without status migrainosus: Secondary | ICD-10-CM | POA: Insufficient documentation

## 2015-06-11 DIAGNOSIS — R1013 Epigastric pain: Secondary | ICD-10-CM | POA: Insufficient documentation

## 2015-06-11 DIAGNOSIS — D649 Anemia, unspecified: Secondary | ICD-10-CM | POA: Insufficient documentation

## 2015-06-11 DIAGNOSIS — Z9889 Other specified postprocedural states: Secondary | ICD-10-CM | POA: Insufficient documentation

## 2015-06-11 DIAGNOSIS — M1990 Unspecified osteoarthritis, unspecified site: Secondary | ICD-10-CM | POA: Insufficient documentation

## 2015-06-11 DIAGNOSIS — F17210 Nicotine dependence, cigarettes, uncomplicated: Secondary | ICD-10-CM | POA: Insufficient documentation

## 2015-06-11 DIAGNOSIS — G8929 Other chronic pain: Secondary | ICD-10-CM | POA: Insufficient documentation

## 2015-06-11 LAB — CBC
HEMATOCRIT: 34.3 % — AB (ref 39.0–52.0)
Hemoglobin: 11.5 g/dL — ABNORMAL LOW (ref 13.0–17.0)
MCH: 31.8 pg (ref 26.0–34.0)
MCHC: 33.5 g/dL (ref 30.0–36.0)
MCV: 94.8 fL (ref 78.0–100.0)
PLATELETS: 270 10*3/uL (ref 150–400)
RBC: 3.62 MIL/uL — ABNORMAL LOW (ref 4.22–5.81)
RDW: 13.4 % (ref 11.5–15.5)
WBC: 9.1 10*3/uL (ref 4.0–10.5)

## 2015-06-11 LAB — COMPREHENSIVE METABOLIC PANEL
ALBUMIN: 3.9 g/dL (ref 3.5–5.0)
ALT: 25 U/L (ref 17–63)
AST: 35 U/L (ref 15–41)
Alkaline Phosphatase: 99 U/L (ref 38–126)
Anion gap: 9 (ref 5–15)
BUN: 7 mg/dL (ref 6–20)
CHLORIDE: 107 mmol/L (ref 101–111)
CO2: 26 mmol/L (ref 22–32)
CREATININE: 0.95 mg/dL (ref 0.61–1.24)
Calcium: 8.9 mg/dL (ref 8.9–10.3)
GFR calc Af Amer: >60 mL/min (ref >60)
GLUCOSE: 105 mg/dL — AB (ref 65–99)
POTASSIUM: 3.2 mmol/L — AB (ref 3.5–5.1)
SODIUM: 142 mmol/L (ref 135–145)
Total Bilirubin: 0.6 mg/dL (ref 0.3–1.2)
Total Protein: 7.9 g/dL (ref 6.5–8.1)

## 2015-06-11 LAB — URINALYSIS, ROUTINE W REFLEX MICROSCOPIC
BILIRUBIN URINE: NEGATIVE
Glucose, UA: NEGATIVE mg/dL
Hgb urine dipstick: NEGATIVE
Ketones, ur: NEGATIVE mg/dL
LEUKOCYTES UA: NEGATIVE
Nitrite: NEGATIVE
PH: 6.5 (ref 5.0–8.0)
Protein, ur: NEGATIVE mg/dL
Specific Gravity, Urine: 1.018 (ref 1.005–1.030)

## 2015-06-11 LAB — LIPASE, BLOOD: Lipase: 36 U/L (ref 11–51)

## 2015-06-11 MED ORDER — HYDROMORPHONE HCL 1 MG/ML IJ SOLN
1.0000 mg | Freq: Once | INTRAMUSCULAR | Status: AC
  Administered 2015-06-11: 1 mg via INTRAMUSCULAR
  Filled 2015-06-11: qty 1

## 2015-06-11 MED ORDER — GI COCKTAIL ~~LOC~~
30.0000 mL | Freq: Once | ORAL | Status: AC
  Administered 2015-06-11: 30 mL via ORAL
  Filled 2015-06-11: qty 30

## 2015-06-11 NOTE — ED Provider Notes (Signed)
CSN: [id number]     Arrival date & time 06/11/15  1753 History   First MD Initiated Contact with Patient 06/11/15 1830     Chief Complaint  Patient presents with  . Abdominal Pain  . Diarrhea   HPI  Patient presents with concern of epigastric, left upper quadrant pain. He notes that he was evaluated here 3 days ago, and since that time has had persistent pain, though exacerbation was notable today. Pain is severe, mild radiation, but none outside the abdomen, and no chest pain, no dyspnea, no syncope. Patient states that he has not had any alcohol today. No vomiting, but the patient continues to have diarrhea, and has it since discharge from our affiliated facility about 3 weeks ago.   Past Medical History  Diagnosis Date  . Hypertension   . Asthma   . Pancreatitis   . Cocaine abuse   . Depression   . H/O suicide attempt 10/2012  . Heart murmur     "when he was little" (03/06/2013)  . Anemia   . H/O hiatal hernia   . GERD (gastroesophageal reflux disease)   . Anxiety   . WPW (Wolff-Parkinson-White syndrome)     [name] 03/06/2013  . High cholesterol   . Femoral condyle fracture ([place]) 03/08/2014    left medial/notes 03/09/2014  . Alcoholism /alcohol abuse ([place])   . Family history of adverse reaction to anesthesia     "grandmother gets confused"  . Shortness of breath     "can happen at anytime" (03/06/2013)  . Pneumonia 1990's X 3  . Chronic bronchitis ([place])   . Sickle cell trait ([place])   . History of blood transfusion 10/2012    "when I tried to commit suicide"  . History of stomach ulcers   . Migraine     "a few times/year" (03/26/2015)  . Arthritis     "knees; arms; elbows" (03/26/2015)  . Chronic lower back pain   . Bipolar disorder ([place])   . PTSD (post-traumatic stress disorder)    Past Surgical History  Procedure Laterality Date  . Facial fracture surgery Left 1990's    "result of trauma"   . Eye surgery Left 1990's    "result of trauma"   . Left heart  catheterization with coronary angiogram Right 03/07/2013    Procedure: LEFT HEART CATHETERIZATION WITH CORONARY ANGIOGRAM;  Surgeon: [name], MD;  Location: [facility];  Service: Cardiovascular;  Laterality: Right;  . Cardiac catheterization    . Fracture surgery    . Umbilical hernia repair    . Hernia repair     Family History  Problem Relation Age of Onset  . Hypertension Other   . Coronary artery disease Other    Social History  Substance Use Topics  . Smoking status: Current Every Day Smoker -- 1.00 packs/day for 30 years    Types: Cigarettes  . Smokeless tobacco: Current User    Types: Chew  . Alcohol Use: 37.8 oz/week    63 Cans of beer per week     Comment: last drink was 04/27/15    Review of Systems  Constitutional:       Per HPI, otherwise negative  HENT:       Per HPI, otherwise negative  Respiratory:       Per HPI, otherwise negative  Cardiovascular:       Per HPI, otherwise negative  Gastrointestinal: Positive for diarrhea. Negative for vomiting.  Endocrine:  Negative aside from HPI  Genitourinary:       Neg aside from HPI   Musculoskeletal:       Per HPI, otherwise negative  Skin: Negative.   Neurological: Negative for syncope.      Allergies  Shellfish-derived products and Trazodone and nefazodone  Home Medications   Prior to Admission medications   Medication Sig Start Date End Date Taking? Authorizing Provider  albuterol (PROVENTIL HFA;VENTOLIN HFA) 108 (90 BASE) MCG/ACT inhaler Inhale 2 puffs into the lungs every 6 (six) hours as needed for wheezing or shortness of breath (wheezing).     Historical Provider, MD  Alum & Mag Hydroxide-Simeth (GI COCKTAIL) SUSP suspension Take 30 mLs by mouth 3 (three) times daily as needed for indigestion. Shake well. 05/25/15   [name], MD  folic acid (FOLVITE) 1 MG tablet Take 1 tablet (1 mg total) by mouth daily. 01/15/15   [name], MD  HYDROcodone-homatropine (HYCODAN) 5-1.5  MG/5ML syrup Take 5 mLs by mouth every 6 (six) hours as needed for cough. 06/08/15   Orpah Greek, MD  levofloxacin (LEVAQUIN) 750 MG tablet Take 1 tablet (750 mg total) by mouth daily. Patient not taking: Reported on 06/08/2015 05/25/15   [name], MD  lipase/protease/amylase (CREON) 12000 UNITS CPEP capsule Take 2 capsules (24,000 Units total) by mouth 3 (three) times daily with meals. 01/15/15   [name], MD  metoprolol tartrate (LOPRESSOR) 25 MG tablet Take 1 tablet (25 mg total) by mouth 2 (two) times daily. 01/15/15   [name], MD  Multiple Vitamin (MULTIVITAMIN) capsule Take 1 capsule by mouth daily. 02/06/14   [name], MD  omeprazole (PRILOSEC) 20 MG capsule Take 1 capsule (20 mg total) by mouth daily. 04/20/15   [name], PA-C  ondansetron (ZOFRAN ODT) 4 MG disintegrating tablet Take 1 tablet (4 mg total) by mouth every 8 (eight) hours as needed for nausea or vomiting. Patient not taking: Reported on 06/08/2015 05/18/15   Lorayne Bender, PA-C  oxyCODONE-acetaminophen (PERCOCET/ROXICET) 5-325 MG tablet Take 1 tablet by mouth every 4 (four) hours as needed for severe pain. 05/25/15   [name], MD  promethazine (PHENERGAN) 25 MG tablet Take 1 tablet (25 mg total) by mouth every 6 (six) hours as needed for nausea or vomiting. 05/25/15   [name], MD  QUEtiapine (SEROQUEL) 50 MG tablet Take 100 mg by mouth at bedtime. 01/15/15   Historical Provider, MD  sildenafil (VIAGRA) 50 MG tablet Take 1 tablet (50 mg total) by mouth daily as needed for erectile dysfunction. 03/31/15   [name], MD  sucralfate (CARAFATE) 1 G tablet Take 1 tablet (1 g total) by mouth 4 (four) times daily -  with meals and at bedtime. 03/15/15   Charlann Lange, PA-C  vitamin B-12 500 MCG tablet Take 1 tablet (500 mcg total) by mouth daily. 03/30/15   [name], MD   BP 137/105 mmHg  Pulse 94  Temp(Src) 98.1 F (36.7 C) (Oral)  Resp 18  SpO2 100% Physical  Exam  Constitutional: He is oriented to person, place, and time. He appears well-developed. No distress.  HENT:  Head: Normocephalic and atraumatic.  Eyes: Conjunctivae and EOM are normal.  Cardiovascular: Normal rate and regular rhythm.   Pulmonary/Chest: Effort normal. No stridor. No respiratory distress.  Abdominal: He exhibits no distension. There is tenderness in the epigastric area.  Musculoskeletal: He exhibits no edema.  Neurological: He is alert and oriented  to person, place, and time.  Skin: Skin is warm and dry.  Psychiatric: He has a normal mood and affect.  Nursing note and vitals reviewed.   ED Course  Procedures (including critical care time) Labs Review Labs Reviewed  COMPREHENSIVE METABOLIC PANEL - Abnormal; Notable for the following:    Potassium 3.2 (*)    Glucose, Bld 105 (*)    All other components within normal limits  CBC - Abnormal; Notable for the following:    RBC 3.62 (*)    Hemoglobin 11.5 (*)    HCT 34.3 (*)    All other components within normal limits  LIPASE, BLOOD  URINALYSIS, ROUTINE W REFLEX MICROSCOPIC (NOT AT [facility])    EMR notable for recent hospitalization for pancreatitis, diagnosis of alcohol-related pancreatitis. Patient was also seen in our facility 3 days ago, with similar labs.  On repeat exam the patient is in no distress.   MDM  History of pancreatitis presents with ongoing epigastric pain. Here, the patient is awake, alert, with soft, non-peritoneal abdomen. Patient does have pain in the epigastrium. However, the patient is hemodynamically stable, with low suspicion for perforation, or acute progression of his pancreatitis given his unremarkable labs, vital signs. Patient did receive analgesia here, was discharged in stable condition to follow-up with gastroenterology team.  Carmin Muskrat, MD 06/11/15 2019

## 2015-06-11 NOTE — ED Notes (Signed)
Patient presents for abdominal pain, N/V/D x4 days. History of pancreatitis. Seen Monday for same, pain was under control and returned yesterday.

## 2015-06-11 NOTE — Discharge Instructions (Signed)
As discussed, your evaluation today has been largely reassuring.  But, it is important that you monitor your condition carefully, and do not hesitate to return to the ED if you develop new, or concerning changes in your condition. ? ?Otherwise, please follow-up with your physician for appropriate ongoing care. ? ?

## 2015-06-11 NOTE — ED Notes (Signed)
Discharge instructions and follow up care reviewed with patient. Patient verbalized understanding. 

## 2015-06-18 ENCOUNTER — Encounter (HOSPITAL_COMMUNITY): Payer: Self-pay

## 2015-06-18 ENCOUNTER — Emergency Department (HOSPITAL_COMMUNITY)
Admission: EM | Admit: 2015-06-18 | Discharge: 2015-06-19 | Disposition: A | Discharge status: Home / Self Care | Payer: No Typology Code available for payment source | Attending: Emergency Medicine | Admitting: Emergency Medicine

## 2015-06-18 DIAGNOSIS — Z8701 Personal history of pneumonia (recurrent): Secondary | ICD-10-CM | POA: Insufficient documentation

## 2015-06-18 DIAGNOSIS — Z862 Personal history of diseases of the blood and blood-forming organs and certain disorders involving the immune mechanism: Secondary | ICD-10-CM | POA: Insufficient documentation

## 2015-06-18 DIAGNOSIS — F319 Bipolar disorder, unspecified: Secondary | ICD-10-CM | POA: Insufficient documentation

## 2015-06-18 DIAGNOSIS — M170 Bilateral primary osteoarthritis of knee: Secondary | ICD-10-CM | POA: Insufficient documentation

## 2015-06-18 DIAGNOSIS — F419 Anxiety disorder, unspecified: Secondary | ICD-10-CM | POA: Insufficient documentation

## 2015-06-18 DIAGNOSIS — F1310 Sedative, hypnotic or anxiolytic abuse, uncomplicated: Secondary | ICD-10-CM | POA: Insufficient documentation

## 2015-06-18 DIAGNOSIS — Z8639 Personal history of other endocrine, nutritional and metabolic disease: Secondary | ICD-10-CM | POA: Insufficient documentation

## 2015-06-18 DIAGNOSIS — K219 Gastro-esophageal reflux disease without esophagitis: Secondary | ICD-10-CM | POA: Insufficient documentation

## 2015-06-18 DIAGNOSIS — R1110 Vomiting, unspecified: Secondary | ICD-10-CM | POA: Insufficient documentation

## 2015-06-18 DIAGNOSIS — I10 Essential (primary) hypertension: Secondary | ICD-10-CM | POA: Insufficient documentation

## 2015-06-18 DIAGNOSIS — Z9889 Other specified postprocedural states: Secondary | ICD-10-CM | POA: Insufficient documentation

## 2015-06-18 DIAGNOSIS — R197 Diarrhea, unspecified: Secondary | ICD-10-CM | POA: Insufficient documentation

## 2015-06-18 DIAGNOSIS — F4310 Post-traumatic stress disorder, unspecified: Secondary | ICD-10-CM | POA: Insufficient documentation

## 2015-06-18 DIAGNOSIS — F1410 Cocaine abuse, uncomplicated: Secondary | ICD-10-CM | POA: Insufficient documentation

## 2015-06-18 DIAGNOSIS — Z79899 Other long term (current) drug therapy: Secondary | ICD-10-CM | POA: Insufficient documentation

## 2015-06-18 DIAGNOSIS — Z8781 Personal history of (healed) traumatic fracture: Secondary | ICD-10-CM | POA: Insufficient documentation

## 2015-06-18 DIAGNOSIS — J45909 Unspecified asthma, uncomplicated: Secondary | ICD-10-CM | POA: Insufficient documentation

## 2015-06-18 DIAGNOSIS — G8929 Other chronic pain: Secondary | ICD-10-CM

## 2015-06-18 DIAGNOSIS — F17210 Nicotine dependence, cigarettes, uncomplicated: Secondary | ICD-10-CM | POA: Insufficient documentation

## 2015-06-18 DIAGNOSIS — R109 Unspecified abdominal pain: Secondary | ICD-10-CM | POA: Insufficient documentation

## 2015-06-18 DIAGNOSIS — F1210 Cannabis abuse, uncomplicated: Secondary | ICD-10-CM | POA: Insufficient documentation

## 2015-06-18 DIAGNOSIS — R011 Cardiac murmur, unspecified: Secondary | ICD-10-CM | POA: Insufficient documentation

## 2015-06-18 DIAGNOSIS — Z915 Personal history of self-harm: Secondary | ICD-10-CM | POA: Insufficient documentation

## 2015-06-18 HISTORY — DX: Cannabis abuse, uncomplicated: F12.10

## 2015-06-18 LAB — URINALYSIS, ROUTINE W REFLEX MICROSCOPIC
BILIRUBIN URINE: NEGATIVE
Glucose, UA: NEGATIVE mg/dL
Hgb urine dipstick: NEGATIVE
Ketones, ur: NEGATIVE mg/dL
Leukocytes, UA: NEGATIVE
NITRITE: NEGATIVE
PH: 5.5 (ref 5.0–8.0)
Protein, ur: NEGATIVE mg/dL
SPECIFIC GRAVITY, URINE: 1.018 (ref 1.005–1.030)

## 2015-06-18 LAB — COMPREHENSIVE METABOLIC PANEL
ALBUMIN: 4.2 g/dL (ref 3.5–5.0)
ALT: 23 U/L (ref 17–63)
ANION GAP: 10 (ref 5–15)
AST: 34 U/L (ref 15–41)
Alkaline Phosphatase: 98 U/L (ref 38–126)
BUN: 9 mg/dL (ref 6–20)
CO2: 22 mmol/L (ref 22–32)
Calcium: 9.2 mg/dL (ref 8.9–10.3)
Chloride: 110 mmol/L (ref 101–111)
Creatinine, Ser: 0.81 mg/dL (ref 0.61–1.24)
GFR calc Af Amer: >60 mL/min (ref >60)
GFR calc non Af Amer: >60 mL/min (ref >60)
GLUCOSE: 104 mg/dL — AB (ref 65–99)
POTASSIUM: 3.8 mmol/L (ref 3.5–5.1)
SODIUM: 142 mmol/L (ref 135–145)
TOTAL PROTEIN: 8.5 g/dL — AB (ref 6.5–8.1)
Total Bilirubin: 0.4 mg/dL (ref 0.3–1.2)

## 2015-06-18 LAB — CBC
HEMATOCRIT: 37.2 % — AB (ref 39.0–52.0)
HEMOGLOBIN: 12 g/dL — AB (ref 13.0–17.0)
MCH: 31 pg (ref 26.0–34.0)
MCHC: 32.3 g/dL (ref 30.0–36.0)
MCV: 96.1 fL (ref 78.0–100.0)
Platelets: 227 10*3/uL (ref 150–400)
RBC: 3.87 MIL/uL — AB (ref 4.22–5.81)
RDW: 13.7 % (ref 11.5–15.5)
WBC: 7.5 10*3/uL (ref 4.0–10.5)

## 2015-06-18 LAB — LIPASE, BLOOD: LIPASE: 29 U/L (ref 11–51)

## 2015-06-18 MED ORDER — SODIUM CHLORIDE 0.9 % IV BOLUS (SEPSIS)
500.0000 mL | Freq: Once | INTRAVENOUS | Status: AC
  Administered 2015-06-18: 500 mL via INTRAVENOUS

## 2015-06-18 MED ORDER — GI COCKTAIL ~~LOC~~
30.0000 mL | Freq: Once | ORAL | Status: AC
  Administered 2015-06-18: 30 mL via ORAL
  Filled 2015-06-18: qty 30

## 2015-06-18 MED ORDER — HALOPERIDOL LACTATE 5 MG/ML IJ SOLN
2.0000 mg | Freq: Once | INTRAMUSCULAR | Status: DC
  Filled 2015-06-18: qty 1

## 2015-06-18 MED ORDER — DICYCLOMINE HCL 10 MG/ML IM SOLN
20.0000 mg | Freq: Once | INTRAMUSCULAR | Status: AC
  Administered 2015-06-18: 20 mg via INTRAMUSCULAR
  Filled 2015-06-18: qty 2

## 2015-06-18 MED ORDER — KETOROLAC TROMETHAMINE 30 MG/ML IJ SOLN
30.0000 mg | Freq: Once | INTRAMUSCULAR | Status: AC
  Administered 2015-06-18: 30 mg via INTRAVENOUS
  Filled 2015-06-18: qty 1

## 2015-06-18 MED ORDER — ONDANSETRON HCL 4 MG/2ML IJ SOLN
4.0000 mg | Freq: Once | INTRAMUSCULAR | Status: AC | PRN
  Administered 2015-06-18: 4 mg via INTRAVENOUS
  Filled 2015-06-18: qty 2

## 2015-06-18 NOTE — ED Provider Notes (Signed)
CSN: [id number]     Arrival date & time 06/18/15  2211 History  By signing my name below, I, [name], attest that this documentation has been prepared under the direction and in the presence of Brette Cast, MD. Electronically Signed: [name], ED Scribe. 06/18/2015. 11:15 PM.    Chief Complaint  Patient presents with  . Abdominal Pain   Patient is a 45 y.o. male presenting with diarrhea. The history is provided by the patient. No language interpreter was used.  Diarrhea Quality:  Watery Severity:  Mild Onset quality:  Gradual Duration:  4 days Timing:  Sporadic Progression:  Unchanged Relieved by:  Nothing Worsened by:  Nothing tried Ineffective treatments:  None tried Associated symptoms: abdominal pain and vomiting   Associated symptoms: no fever   Risk factors: no recent antibiotic use    HPI Comments: [name] is a 45 y.o. male with hx of pancreatitis who presents to the Emergency Department complaining of recurrent, constant, stabbing left upper quadrant abdominal pain onset 2 hours ago after eating a heavy meal.  Associated symptoms include watery diarrhea (3-4 episodes x4 days), and 1 episode of emesis daily (x4 days).  Patient's last bowel movement was today.  Patient describes this as his typical episode of pancreatitis which he attributes to former alcohol abuse.  He reports taking prilosec, carafate, and creon for this condition.  He denies dysuria, hematuria, frequency, fever, cough, congestion, watery/itchy eyes.     Past Medical History  Diagnosis Date  . Hypertension   . Asthma   . Pancreatitis   . Cocaine abuse   . Depression   . H/O suicide attempt 10/2012  . Heart murmur     "when he was little" (03/06/2013)  . Anemia   . H/O hiatal hernia   . GERD (gastroesophageal reflux disease)   . Anxiety   . WPW (Wolff-Parkinson-White syndrome)     [name] 03/06/2013  . High cholesterol   . Femoral condyle fracture ([place]) 03/08/2014    left  medial/notes 03/09/2014  . Alcoholism /alcohol abuse ([place])   . Family history of adverse reaction to anesthesia     "grandmother gets confused"  . Shortness of breath     "can happen at anytime" (03/06/2013)  . Pneumonia 1990's X 3  . Chronic bronchitis ([place])   . Sickle cell trait ([place])   . History of blood transfusion 10/2012    "when I tried to commit suicide"  . History of stomach ulcers   . Migraine     "a few times/year" (03/26/2015)  . Arthritis     "knees; arms; elbows" (03/26/2015)  . Chronic lower back pain   . Bipolar disorder ([place])   . PTSD (post-traumatic stress disorder)    Past Surgical History  Procedure Laterality Date  . Facial fracture surgery Left 1990's    "result of trauma"   . Eye surgery Left 1990's    "result of trauma"   . Left heart catheterization with coronary angiogram Right 03/07/2013    Procedure: LEFT HEART CATHETERIZATION WITH CORONARY ANGIOGRAM;  Surgeon: [name], MD;  Location: [facility];  Service: Cardiovascular;  Laterality: Right;  . Cardiac catheterization    . Fracture surgery    . Umbilical hernia repair    . Hernia repair     Family History  Problem Relation Age of Onset  . Hypertension Other   . Coronary artery disease Other    Social History  Substance Use Topics  .  Smoking status: Current Every Day Smoker -- 1.00 packs/day for 30 years    Types: Cigarettes  . Smokeless tobacco: Current User    Types: Chew  . Alcohol Use: 37.8 oz/week    63 Cans of beer per week     Comment: last drink was 04/27/15    Review of Systems  Constitutional: Negative for fever.  Gastrointestinal: Positive for vomiting, abdominal pain and diarrhea.  All other systems reviewed and are negative.  A complete 10 system review of systems was obtained and all systems are negative except as noted in the HPI and PMH.   Allergies  Shellfish-derived products and Trazodone and nefazodone  Home Medications   Prior to Admission medications    Medication Sig Start Date End Date Taking? Authorizing Provider  albuterol (PROVENTIL HFA;VENTOLIN HFA) 108 (90 BASE) MCG/ACT inhaler Inhale 2 puffs into the lungs every 6 (six) hours as needed for wheezing or shortness of breath (wheezing).    Yes Historical Provider, MD  alum & mag hydroxide-simeth (MAALOX PLUS) 400-400-40 MG/5ML suspension Take 10 mLs by mouth every 6 (six) hours as needed for indigestion.   Yes Historical Provider, MD  folic acid (FOLVITE) 1 MG tablet Take 1 tablet (1 mg total) by mouth daily. 01/15/15  Yes [name], MD  lipase/protease/amylase (CREON) 12000 UNITS CPEP capsule Take 2 capsules (24,000 Units total) by mouth 3 (three) times daily with meals. 01/15/15  Yes [name], MD  metoprolol tartrate (LOPRESSOR) 25 MG tablet Take 1 tablet (25 mg total) by mouth 2 (two) times daily. 01/15/15  Yes [name], MD  Multiple Vitamin (MULTIVITAMIN) capsule Take 1 capsule by mouth daily. 02/06/14  Yes [name], MD  omeprazole (PRILOSEC) 20 MG capsule Take 1 capsule (20 mg total) by mouth daily. 04/20/15  Yes [name], PA-C  oxyCODONE-acetaminophen (PERCOCET/ROXICET) 5-325 MG tablet Take 1 tablet by mouth every 4 (four) hours as needed for severe pain. 05/25/15  Yes [name], MD  promethazine (PHENERGAN) 25 MG tablet Take 1 tablet (25 mg total) by mouth every 6 (six) hours as needed for nausea or vomiting. 05/25/15  Yes [name], MD  QUEtiapine (SEROQUEL) 50 MG tablet Take 100 mg by mouth at bedtime. 01/15/15  Yes Historical Provider, MD  sildenafil (VIAGRA) 50 MG tablet Take 1 tablet (50 mg total) by mouth daily as needed for erectile dysfunction. 03/31/15  Yes Olugbemiga Essie Christine, MD  sucralfate (CARAFATE) 1 G tablet Take 1 tablet (1 g total) by mouth 4 (four) times daily -  with meals and at bedtime. 03/15/15  Yes Shari Upstill, PA-C  vitamin B-12 500 MCG tablet Take 1 tablet (500 mcg total) by mouth daily. 03/30/15  Yes [name], MD  Alum & Mag Hydroxide-Simeth (GI COCKTAIL) SUSP suspension Take 30 mLs by mouth 3 (three) times daily as needed for indigestion. Shake well. Patient not taking: Reported on 06/18/2015 05/25/15   [name], MD  HYDROcodone-homatropine [facility]) 5-1.5 MG/5ML syrup Take 5 mLs by mouth every 6 (six) hours as needed for cough. Patient not taking: Reported on 06/18/2015 06/08/15   Orpah Greek, MD  levofloxacin (LEVAQUIN) 750 MG tablet Take 1 tablet (750 mg total) by mouth daily. Patient not taking: Reported on 06/08/2015 05/25/15   [name], MD  ondansetron (ZOFRAN ODT) 4 MG disintegrating tablet Take 1 tablet (4 mg total) by mouth every 8 (eight) hours as needed for nausea or vomiting. Patient not taking: Reported on  06/08/2015 05/18/15   Shawn C Joy, PA-C   BP 264/114 mmHg  Pulse 100  Temp(Src) 98.5 F (36.9 C) (Oral)  Resp 17  SpO2 100% Physical Exam  Constitutional: He is oriented to person, place, and time. He appears well-developed and well-nourished. No distress.  HENT:  Head: Normocephalic and atraumatic.  Mouth/Throat: Oropharynx is clear and moist.  Eyes: Conjunctivae and EOM are normal. Pupils are equal, round, and reactive to light.  Neck: Normal range of motion. Neck supple. No tracheal deviation present.  Cardiovascular: Normal rate and regular rhythm.   Pulmonary/Chest: Effort normal and breath sounds normal. No respiratory distress. He has no wheezes. He has no rales.  Abdominal: Soft. He exhibits no distension and no mass. There is no tenderness. There is no rebound and no guarding.  Gassy throughout.     Musculoskeletal: Normal range of motion.  Neurological: He is alert and oriented to person, place, and time.  Skin: Skin is warm and dry.  Psychiatric: He has a normal mood and affect. His behavior is normal.  Nursing note and vitals reviewed.   ED Course  Procedures (including critical care time)  DIAGNOSTIC STUDIES: Oxygen Saturation is 100%  on RA, normal by my interpretation.    COORDINATION OF CARE:  11:20 PM Discussed treatment plan with patient at bedside.  Patient acknowledges and agrees with plan.    Labs Review Labs Reviewed  CBC - Abnormal; Notable for the following:    RBC 3.87 (*)    Hemoglobin 12.0 (*)    HCT 37.2 (*)    All other components within normal limits  LIPASE, BLOOD  COMPREHENSIVE METABOLIC PANEL  URINALYSIS, ROUTINE W REFLEX MICROSCOPIC (NOT AT [facility])    Imaging Review No results found. I have personally reviewed and evaluated these images and lab results as part of my medical decision-making.   EKG Interpretation None      MDM   Final diagnoses:  None    Symptoms are chronic and patient has no objective findings.  I do not feel that narcotics are in the patient's best interest and he will not be receiving opioids as labs, exam and vitals are benign and reassuring. Patient has a valid RX for opioids in the [place] and states he is taking them PRN but there are no opioids in his urine drug screen.  Patient was treated for chronic pain and vomiting using haldol and ativan and IVF.  Stop using marijuana and cocaine and alcohol. He is stable for discharge at this time.  Follow up with your PMD for ongoing care  I personally performed the services described in this documentation, which was scribed in my presence. The recorded information has been reviewed and is accurate.      Veatrice Kells, MD 06/19/15 0157

## 2015-06-18 NOTE — ED Notes (Signed)
Bed: WA02 Expected date:  Expected time:  Means of arrival:  Comments: Upper abdominal pain

## 2015-06-18 NOTE — ED Notes (Signed)
Per EMS- pt c/o epigastric pain and tenderness since 1900. Reporting diarrhea four days and nausea for the past day.

## 2015-06-19 ENCOUNTER — Encounter (HOSPITAL_COMMUNITY): Payer: Self-pay | Admitting: *Deleted

## 2015-06-19 ENCOUNTER — Encounter (HOSPITAL_COMMUNITY): Payer: Self-pay | Admitting: Emergency Medicine

## 2015-06-19 ENCOUNTER — Emergency Department (HOSPITAL_COMMUNITY)
Admission: EM | Admit: 2015-06-19 | Discharge: 2015-06-19 | Disposition: A | Discharge status: Home / Self Care | Payer: No Typology Code available for payment source | Attending: Emergency Medicine | Admitting: Emergency Medicine

## 2015-06-19 DIAGNOSIS — M1990 Unspecified osteoarthritis, unspecified site: Secondary | ICD-10-CM | POA: Insufficient documentation

## 2015-06-19 DIAGNOSIS — K219 Gastro-esophageal reflux disease without esophagitis: Secondary | ICD-10-CM | POA: Insufficient documentation

## 2015-06-19 DIAGNOSIS — Z8781 Personal history of (healed) traumatic fracture: Secondary | ICD-10-CM | POA: Insufficient documentation

## 2015-06-19 DIAGNOSIS — Z862 Personal history of diseases of the blood and blood-forming organs and certain disorders involving the immune mechanism: Secondary | ICD-10-CM | POA: Insufficient documentation

## 2015-06-19 DIAGNOSIS — F17210 Nicotine dependence, cigarettes, uncomplicated: Secondary | ICD-10-CM | POA: Insufficient documentation

## 2015-06-19 DIAGNOSIS — G8929 Other chronic pain: Secondary | ICD-10-CM

## 2015-06-19 DIAGNOSIS — I10 Essential (primary) hypertension: Secondary | ICD-10-CM | POA: Insufficient documentation

## 2015-06-19 DIAGNOSIS — R109 Unspecified abdominal pain: Secondary | ICD-10-CM

## 2015-06-19 DIAGNOSIS — F419 Anxiety disorder, unspecified: Secondary | ICD-10-CM | POA: Insufficient documentation

## 2015-06-19 DIAGNOSIS — J45909 Unspecified asthma, uncomplicated: Secondary | ICD-10-CM | POA: Insufficient documentation

## 2015-06-19 DIAGNOSIS — R011 Cardiac murmur, unspecified: Secondary | ICD-10-CM | POA: Insufficient documentation

## 2015-06-19 DIAGNOSIS — Z8701 Personal history of pneumonia (recurrent): Secondary | ICD-10-CM | POA: Insufficient documentation

## 2015-06-19 DIAGNOSIS — R1013 Epigastric pain: Secondary | ICD-10-CM | POA: Insufficient documentation

## 2015-06-19 DIAGNOSIS — Z79899 Other long term (current) drug therapy: Secondary | ICD-10-CM | POA: Insufficient documentation

## 2015-06-19 DIAGNOSIS — F319 Bipolar disorder, unspecified: Secondary | ICD-10-CM | POA: Insufficient documentation

## 2015-06-19 DIAGNOSIS — Z9889 Other specified postprocedural states: Secondary | ICD-10-CM | POA: Insufficient documentation

## 2015-06-19 DIAGNOSIS — G43909 Migraine, unspecified, not intractable, without status migrainosus: Secondary | ICD-10-CM | POA: Insufficient documentation

## 2015-06-19 LAB — RAPID URINE DRUG SCREEN, HOSP PERFORMED
AMPHETAMINES: NOT DETECTED
BARBITURATES: POSITIVE — AB
BENZODIAZEPINES: NOT DETECTED
COCAINE: POSITIVE — AB
Opiates: NOT DETECTED
TETRAHYDROCANNABINOL: POSITIVE — AB

## 2015-06-19 MED ORDER — HYDROMORPHONE HCL 1 MG/ML IJ SOLN: 2.0000 mg | Freq: Once | INTRAMUSCULAR | Status: DC

## 2015-06-19 MED ORDER — LORAZEPAM 2 MG/ML IJ SOLN
0.5000 mg | Freq: Once | INTRAMUSCULAR | Status: AC
  Administered 2015-06-19: 0.5 mg via INTRAVENOUS
  Filled 2015-06-19: qty 1

## 2015-06-19 MED ORDER — HALOPERIDOL LACTATE 5 MG/ML IJ SOLN
5.0000 mg | Freq: Once | INTRAMUSCULAR | Status: DC
Start: 2015-06-19 — End: 2015-06-19

## 2015-06-19 MED ORDER — ONDANSETRON HCL 4 MG/2ML IJ SOLN
4.0000 mg | Freq: Once | INTRAMUSCULAR | Status: AC
  Administered 2015-06-19: 4 mg via INTRAVENOUS
  Filled 2015-06-19: qty 2

## 2015-06-19 MED ORDER — HYDROMORPHONE HCL 1 MG/ML IJ SOLN
1.0000 mg | Freq: Once | INTRAMUSCULAR | Status: AC
  Administered 2015-06-19: 1 mg via INTRAVENOUS
  Filled 2015-06-19: qty 1

## 2015-06-19 NOTE — ED Notes (Signed)
Pt uncooperative when removing IV. Pt. Said that his pain was not managed and that he was going to another hospital so he wanted to keep the IV in. Security called as pt. was not complying. Pt. Did not sign and only let the IV be removed after security prevented him from leaving.

## 2015-06-19 NOTE — ED Provider Notes (Signed)
CSN: [id number]     Arrival date & time 06/19/15  [id number] History   By signing my name below, I, [name], attest that this documentation has been prepared under the direction and in the presence of Everlene Balls, MD . Electronically Signed: [name], Scribe. 06/19/2015. 4:05 AM.    Chief Complaint  Patient presents with  . Abdominal Pain     The history is provided by the patient. No language interpreter was used.   HPI Comments:  [name] is a 45 y.o. male with a history of pancreatitis who presents to the Emergency Department complaining of recurrent constant LUQ abdominal pain x a few hours. His pain today is similar to past episodes of alcoholic pancreatitis. Pt states he no longer drinks but notes his episodes are now triggered by diet. He notes these episodes occur ~ once a week. He reports associated nausea, vomiting, and diarrhea.  Pt was evaluated at [facility] last night for his pain, was given GI cocktail and Toradol, and was discharged ~ 0200 this AM but notes his pain has not improved. He denies fever.   Past Medical History  Diagnosis Date  . Hypertension   . Asthma   . Pancreatitis   . Cocaine abuse   . Depression   . H/O suicide attempt 10/2012  . Heart murmur     "when he was little" (03/06/2013)  . Anemia   . H/O hiatal hernia   . GERD (gastroesophageal reflux disease)   . Anxiety   . WPW (Wolff-Parkinson-White syndrome)     [name] 03/06/2013  . High cholesterol   . Femoral condyle fracture ([place]) 03/08/2014    left medial/notes 03/09/2014  . Alcoholism /alcohol abuse ([place])   . Family history of adverse reaction to anesthesia     "grandmother gets confused"  . Shortness of breath     "can happen at anytime" (03/06/2013)  . Pneumonia 1990's X 3  . Chronic bronchitis ([place])   . Sickle cell trait ([place])   . History of blood transfusion 10/2012    "when I tried to commit suicide"  . History of stomach ulcers   . Migraine     "a few times/year" (03/26/2015)   . Arthritis     "knees; arms; elbows" (03/26/2015)  . Chronic lower back pain   . Bipolar disorder ([place])   . PTSD (post-traumatic stress disorder)   . Marijuana abuse, continuous    Past Surgical History  Procedure Laterality Date  . Facial fracture surgery Left 1990's    "result of trauma"   . Eye surgery Left 1990's    "result of trauma"   . Left heart catheterization with coronary angiogram Right 03/07/2013    Procedure: LEFT HEART CATHETERIZATION WITH CORONARY ANGIOGRAM;  Surgeon: [name], MD;  Location: [facility];  Service: Cardiovascular;  Laterality: Right;  . Cardiac catheterization    . Fracture surgery    . Umbilical hernia repair    . Hernia repair     Family History  Problem Relation Age of Onset  . Hypertension Other   . Coronary artery disease Other    Social History  Substance Use Topics  . Smoking status: Current Every Day Smoker -- 1.00 packs/day for 30 years    Types: Cigarettes  . Smokeless tobacco: Current User    Types: Chew  . Alcohol Use: 37.8 oz/week    63 Cans of beer per week     Comment: last drink was 06/18/15  Review of Systems  10 systems reviewed and all are negative for acute change except as noted in the HPI.   Allergies  Shellfish-derived products and Trazodone and nefazodone  Home Medications   Prior to Admission medications   Medication Sig Start Date End Date Taking? Authorizing Provider  albuterol (PROVENTIL HFA;VENTOLIN HFA) 108 (90 BASE) MCG/ACT inhaler Inhale 2 puffs into the lungs every 6 (six) hours as needed for wheezing or shortness of breath (wheezing).     Historical Provider, MD  Alum & Mag Hydroxide-Simeth (GI COCKTAIL) SUSP suspension Take 30 mLs by mouth 3 (three) times daily as needed for indigestion. Shake well. Patient not taking: Reported on 06/18/2015 05/25/15   [name], MD  alum & mag hydroxide-simeth (MAALOX PLUS) 400-400-40 MG/5ML suspension Take 10 mLs by mouth every 6 (six) hours as needed  for indigestion.    Historical Provider, MD  folic acid (FOLVITE) 1 MG tablet Take 1 tablet (1 mg total) by mouth daily. 01/15/15   [name], MD  HYDROcodone-homatropine (HYCODAN) 5-1.5 MG/5ML syrup Take 5 mLs by mouth every 6 (six) hours as needed for cough. Patient not taking: Reported on 06/18/2015 06/08/15   Orpah Greek, MD  levofloxacin (LEVAQUIN) 750 MG tablet Take 1 tablet (750 mg total) by mouth daily. Patient not taking: Reported on 06/08/2015 05/25/15   [name], MD  lipase/protease/amylase (CREON) 12000 UNITS CPEP capsule Take 2 capsules (24,000 Units total) by mouth 3 (three) times daily with meals. 01/15/15   [name], MD  metoprolol tartrate (LOPRESSOR) 25 MG tablet Take 1 tablet (25 mg total) by mouth 2 (two) times daily. 01/15/15   [name], MD  Multiple Vitamin (MULTIVITAMIN) capsule Take 1 capsule by mouth daily. 02/06/14   [name], MD  omeprazole (PRILOSEC) 20 MG capsule Take 1 capsule (20 mg total) by mouth daily. 04/20/15   [name], PA-C  ondansetron (ZOFRAN ODT) 4 MG disintegrating tablet Take 1 tablet (4 mg total) by mouth every 8 (eight) hours as needed for nausea or vomiting. Patient not taking: Reported on 06/08/2015 05/18/15   Lorayne Bender, PA-C  oxyCODONE-acetaminophen (PERCOCET/ROXICET) 5-325 MG tablet Take 1 tablet by mouth every 4 (four) hours as needed for severe pain. 05/25/15   [name], MD  promethazine (PHENERGAN) 25 MG tablet Take 1 tablet (25 mg total) by mouth every 6 (six) hours as needed for nausea or vomiting. 05/25/15   [name], MD  QUEtiapine (SEROQUEL) 50 MG tablet Take 100 mg by mouth at bedtime. 01/15/15   Historical Provider, MD  sildenafil (VIAGRA) 50 MG tablet Take 1 tablet (50 mg total) by mouth daily as needed for erectile dysfunction. 03/31/15   [name], MD  sucralfate (CARAFATE) 1 G tablet Take 1 tablet (1 g total) by mouth 4 (four) times daily -  with meals and at bedtime.  03/15/15   Charlann Lange, PA-C  vitamin B-12 500 MCG tablet Take 1 tablet (500 mcg total) by mouth daily. 03/30/15   [name], MD   BP 163/106 mmHg  Pulse 98  Temp(Src) 97.9 F (36.6 C) (Oral)  Resp 20  Wt 141 lb (63.957 kg)  SpO2 100% Physical Exam  Constitutional: He is oriented to person, place, and time. Vital signs are normal. He appears well-developed and well-nourished.  Non-toxic appearance. He does not appear ill. No distress.  HENT:  Head: Normocephalic and atraumatic.  Nose: Nose normal.  Mouth/Throat: Oropharynx is clear and  moist. No oropharyngeal exudate.  Eyes: Conjunctivae and EOM are normal. Pupils are equal, round, and reactive to light. No scleral icterus.  Neck: Normal range of motion. Neck supple. No tracheal deviation, no edema, no erythema and normal range of motion present. No thyroid mass and no thyromegaly present.  Cardiovascular: Normal rate, regular rhythm, S1 normal, S2 normal, normal heart sounds, intact distal pulses and normal pulses.  Exam reveals no gallop and no friction rub.   No murmur heard. Pulmonary/Chest: Effort normal and breath sounds normal. No respiratory distress. He has no wheezes. He has no rhonchi. He has no rales.  Abdominal: Soft. Normal appearance and bowel sounds are normal. He exhibits no distension, no ascites and no mass. There is no hepatosplenomegaly. There is tenderness (mid epigastrum). There is no rebound, no guarding and no CVA tenderness.  Musculoskeletal: Normal range of motion. He exhibits no edema or tenderness.  Lymphadenopathy:    He has no cervical adenopathy.  Neurological: He is alert and oriented to person, place, and time. He has normal strength. No cranial nerve deficit or sensory deficit.  Skin: Skin is warm, dry and intact. No petechiae and no rash noted. He is not diaphoretic. No erythema. No pallor.  Psychiatric: He has a normal mood and affect. His behavior is normal. Judgment normal.  Nursing note and  vitals reviewed.   ED Course  Procedures   DIAGNOSTIC STUDIES:  Oxygen Saturation is 98% on RA, normal by my interpretation.    COORDINATION OF CARE:  4:02 AM Discussed treatment plan with pt at bedside and pt agreed to plan.  Labs Review Labs Reviewed - No data to display  Imaging Review No results found. I have personally reviewed and evaluated these images and lab results as part of my medical decision-making.   EKG Interpretation None      MDM   Final diagnoses:  None   Patient presents to the ED for chronic abdominal pain. He just left [facility] because they did not give him narcotics.  He states he still drinks but tries to eat better.  Labs were just drawn and repeat is not needed.  Patient was given one dose of dilaudid for breakthrough pain and encouraged to see PCP within 3 days for his chronic pain and HTN.  He was not given any Rx to go home with.  He appears well and in NAD.  VS remain within his normal limits and he is safe for Dc.   I personally performed the services described in this documentation, which was scribed in my presence. The recorded information has been reviewed and is accurate.     Everlene Balls, MD 06/19/15 [phone]

## 2015-06-19 NOTE — ED Notes (Addendum)
Pt. given a bus pass , ginger ale with cup of ice and [place] sandwich at discharge. Refused wheelchair at discharge.

## 2015-06-19 NOTE — Discharge Instructions (Signed)
Chronic Pain [name], see your primary care doctor for control of your chronic pain and your high blood pressure.  Come back to the ED Immediately if any symptoms worsen, Thank you.   Chronic pain can be defined as pain that is off and on and lasts for 3-6 months or longer. Many things cause chronic pain, which can make it difficult to make a diagnosis. There are many treatment options available for chronic pain. However, finding a treatment that works well for you may require trying various approaches until the right one is found. Many people benefit from a combination of two or more types of treatment to control their pain. SYMPTOMS  Chronic pain can occur anywhere in the body and can range from mild to very severe. Some types of chronic pain include:  Headache.  Low back pain.  Cancer pain.  Arthritis pain.  Neurogenic pain. This is pain resulting from damage to nerves. People with chronic pain may also have other symptoms such as:  Depression.  Anger.  Insomnia.  Anxiety. DIAGNOSIS  Your health care provider will help diagnose your condition over time. In many cases, the initial focus will be on excluding possible conditions that could be causing the pain. Depending on your symptoms, your health care provider may order tests to diagnose your condition. Some of these tests may include:   Blood tests.   CT scan.   MRI.   X-rays.   Ultrasounds.   Nerve conduction studies.  You may need to see a specialist.  TREATMENT  Finding treatment that works well may take time. You may be referred to a pain specialist. He or she may prescribe medicine or therapies, such as:   Mindful meditation or yoga.  Shots (injections) of numbing or pain-relieving medicines into the spine or area of pain.  Local electrical stimulation.  Acupuncture.   Massage therapy.   Aroma, color, light, or sound therapy.   Biofeedback.   Working with a physical therapist to keep from  getting stiff.   Regular, gentle exercise.   Cognitive or behavioral therapy.   Group support.  Sometimes, surgery may be recommended.  HOME CARE INSTRUCTIONS   Take all medicines as directed by your health care provider.   Lessen stress in your life by relaxing and doing things such as listening to calming music.   Exercise or be active as directed by your health care provider.   Eat a healthy diet and include things such as vegetables, fruits, fish, and lean meats in your diet.   Keep all follow-up appointments with your health care provider.   Attend a support group with others suffering from chronic pain. SEEK MEDICAL CARE IF:   Your pain gets worse.   You develop a new pain that was not there before.   You cannot tolerate medicines given to you by your health care provider.   You have new symptoms since your last visit with your health care provider.  SEEK IMMEDIATE MEDICAL CARE IF:   You feel weak.   You have decreased sensation or numbness.   You lose control of bowel or bladder function.   Your pain suddenly gets much worse.   You develop shaking.  You develop chills.  You develop confusion.  You develop chest pain.  You develop shortness of breath.  MAKE SURE YOU:  Understand these instructions.  Will watch your condition.  Will get help right away if you are not doing well or get worse.   This  information is not intended to replace advice given to you by your health care provider. Make sure you discuss any questions you have with your health care provider.   Document Released: 01/15/2002 Document Revised: 12/26/2012 Document Reviewed: 10/19/2012 Elsevier Interactive Patient Education 2016 [employer]. Hypertension Hypertension is another name for high blood pressure. High blood pressure forces your heart to work harder to pump blood. A blood pressure reading has two numbers, which includes a higher number over a lower number  (example: 110/72). HOME CARE   Have your blood pressure rechecked by your doctor.  Only take medicine as told by your doctor. Follow the directions carefully. The medicine does not work as well if you skip doses. Skipping doses also puts you at risk for problems.  Do not smoke.  Monitor your blood pressure at home as told by your doctor. GET HELP IF:  You think you are having a reaction to the medicine you are taking.  You have repeat headaches or feel dizzy.  You have puffiness (swelling) in your ankles.  You have trouble with your vision. GET HELP RIGHT AWAY IF:   You get a very bad headache and are confused.  You feel weak, numb, or faint.  You get chest or belly (abdominal) pain.  You throw up (vomit).  You cannot breathe very well. MAKE SURE YOU:   Understand these instructions.  Will watch your condition.  Will get help right away if you are not doing well or get worse.   This information is not intended to replace advice given to you by your health care provider. Make sure you discuss any questions you have with your health care provider.   Document Released: 10/12/2007 Document Revised: 04/30/2013 Document Reviewed: 02/15/2013 Elsevier Interactive Patient Education [employer].

## 2015-06-19 NOTE — ED Notes (Signed)
Patient presents via EMS with c/o abd pain.  Was seen at [facility] this evening and discharged at 0230.  Came here because they did not treat his pain

## 2015-06-19 NOTE — Discharge Instructions (Signed)
Cannabis Use Disorder Cannabis use disorder is a mental disorder. It is not one-time or occasional use of cannabis, more commonly known as marijuana. Cannabis use disorder is the continued, nonmedical use of cannabis that interferes with normal life activities or causes health problems. People with cannabis use disorder get a feeling of extreme pleasure and relaxation from cannabis use. This "high" is very rewarding and causes people to use over and over.  The mind-altering ingredient in cannabis is know as THC. THC can also interfere with motor coordination, memory, judgment, and accurate sense of space and time. These effects can last for a few days after using cannabis. Regular heavy cannabis use can cause long-lasting problems with thinking and learning. In young people, these problems may be permanent. Cannabis sometimes causes severe anxiety, paranoia, or visual hallucinations. Man-made (synthetic) cannabis-like drugs, such as "spice" and "K2," cause the same effects as THC but are much stronger. Cannabis-like drugs can cause dangerously high blood pressure and heart rate.  Cannabis use disorder usually starts in the teenage years. It can trigger the development of schizophrenia. It is somewhat more common in men than women. People who have family members with the disorder or existing mental health issues such as depression and posttraumatic stress disorderare more likely to develop cannabis use disorder. People with cannabis use disorder are at higher risk for use of other drugs of abuse.  SIGNS AND SYMPTOMS Signs and symptoms of cannabis use disorder include:   Use of cannabis in larger amounts or over a longer period than intended.   Unsuccessful attempts to cut down or control cannabis use.   A lot of time spent obtaining, using, or recovering from the effects of cannabis.   A strong desire or urge to use cannabis (cravings).   Continued use of cannabis in spite of problems at work,  school, or home because of use.   Continued use of cannabis in spite of relationship problems because of use.  Giving up or cutting down on important life activities because of cannabis use.  Use of cannabis over and over even in situations when it is physically hazardous, such as when driving a car.   Continued use of cannabis in spite of a physical problem that is likely related to use. Physical problems can include:  Chronic cough.  Bronchitis.  Emphysema.  Throat and lung cancer.  Continued use of cannabis in spite of a mental problem that is likely related to use. Mental problems can include:  Psychosis.  Anxiety.  Difficulty sleeping.  Need to use more and more cannabis to get the same effect, or lessened effect over time with use of the same amount (tolerance).  Having withdrawal symptoms when cannabis use is stopped, or using cannabis to reduce or avoid withdrawal symptoms. Withdrawal symptoms include:  Irritability or anger.  Anxiety or restlessness.  Difficulty sleeping.  Loss of appetite or weight.  Aches and pains.  Shakiness.  Sweating.  Chills. DIAGNOSIS Cannabis use disorder is diagnosed by your health care provider. You may be asked questions about your cannabis use and how it affects your life. A physical exam may be done. A drug screen may be done. You may be referred to a mental health professional. The diagnosis of cannabis use disorder requires at least two symptoms within 12 months. The type of cannabis use disorder you have depends on the number of symptoms you have. The type may be:  Mild. Two or three signs and symptoms.   Moderate. Four or   five signs and symptoms.   Severe. Six or more signs and symptoms.  TREATMENT Treatment is usually provided by mental health professionals with training in substance use disorders. The following options are available:  Counseling or talk therapy. Talk therapy addresses the reasons you use  cannabis. It also addresses ways to keep you from using again. The goals of talk therapy include:  Identifying and avoiding triggers for use.  Learning how to handle cravings.  Replacing use with healthy activities.  Support groups. Support groups provide emotional support, advice, and guidance.  Medicine. Medicine is used to treat mental health issues that trigger cannabis use or that result from it. HOME CARE INSTRUCTIONS  Take medicines only as directed by your health care provider.  Check with your health care provider before starting any new medicines.  Keep all follow-up visits as directed by your health care provider. SEEK MEDICAL CARE IF:  You are not able to take your medicines as directed.  Your symptoms get worse. SEEK IMMEDIATE MEDICAL CARE IF: You have serious thoughts about hurting yourself or others. FOR MORE INFORMATION  National Institute on Drug Abuse: www.drugabuse.gov  Substance Abuse and Mental Health Services Administration: www.samhsa.gov   This information is not intended to replace advice given to you by your health care provider. Make sure you discuss any questions you have with your health care provider.   Document Released: 04/22/2000 Document Revised: 05/16/2014 Document Reviewed: 05/08/2013 Elsevier Interactive Patient Education 2016 Elsevier Inc.  

## 2015-06-25 ENCOUNTER — Emergency Department (HOSPITAL_COMMUNITY)
Admission: EM | Admit: 2015-06-25 | Discharge: 2015-06-26 | Disposition: A | Discharge status: Home / Self Care | Payer: No Typology Code available for payment source | Attending: Emergency Medicine | Admitting: Emergency Medicine

## 2015-06-25 ENCOUNTER — Encounter (HOSPITAL_COMMUNITY): Payer: Self-pay | Admitting: Emergency Medicine

## 2015-06-25 ENCOUNTER — Emergency Department (HOSPITAL_COMMUNITY)
Admission: EM | Admit: 2015-06-25 | Discharge: 2015-06-25 | Disposition: A | Discharge status: Home / Self Care | Payer: No Typology Code available for payment source | Attending: Emergency Medicine | Admitting: Emergency Medicine

## 2015-06-25 DIAGNOSIS — R1012 Left upper quadrant pain: Secondary | ICD-10-CM | POA: Insufficient documentation

## 2015-06-25 DIAGNOSIS — R109 Unspecified abdominal pain: Secondary | ICD-10-CM

## 2015-06-25 DIAGNOSIS — Z8701 Personal history of pneumonia (recurrent): Secondary | ICD-10-CM | POA: Insufficient documentation

## 2015-06-25 DIAGNOSIS — G43909 Migraine, unspecified, not intractable, without status migrainosus: Secondary | ICD-10-CM | POA: Insufficient documentation

## 2015-06-25 DIAGNOSIS — K219 Gastro-esophageal reflux disease without esophagitis: Secondary | ICD-10-CM | POA: Insufficient documentation

## 2015-06-25 DIAGNOSIS — F17210 Nicotine dependence, cigarettes, uncomplicated: Secondary | ICD-10-CM | POA: Insufficient documentation

## 2015-06-25 DIAGNOSIS — D649 Anemia, unspecified: Secondary | ICD-10-CM | POA: Insufficient documentation

## 2015-06-25 DIAGNOSIS — Z79899 Other long term (current) drug therapy: Secondary | ICD-10-CM | POA: Insufficient documentation

## 2015-06-25 DIAGNOSIS — I10 Essential (primary) hypertension: Secondary | ICD-10-CM | POA: Insufficient documentation

## 2015-06-25 DIAGNOSIS — Z862 Personal history of diseases of the blood and blood-forming organs and certain disorders involving the immune mechanism: Secondary | ICD-10-CM | POA: Insufficient documentation

## 2015-06-25 DIAGNOSIS — Z8781 Personal history of (healed) traumatic fracture: Secondary | ICD-10-CM | POA: Insufficient documentation

## 2015-06-25 DIAGNOSIS — F419 Anxiety disorder, unspecified: Secondary | ICD-10-CM | POA: Insufficient documentation

## 2015-06-25 DIAGNOSIS — G8929 Other chronic pain: Secondary | ICD-10-CM | POA: Insufficient documentation

## 2015-06-25 DIAGNOSIS — E7800 Pure hypercholesterolemia, unspecified: Secondary | ICD-10-CM | POA: Insufficient documentation

## 2015-06-25 DIAGNOSIS — R011 Cardiac murmur, unspecified: Secondary | ICD-10-CM | POA: Insufficient documentation

## 2015-06-25 DIAGNOSIS — J45909 Unspecified asthma, uncomplicated: Secondary | ICD-10-CM | POA: Insufficient documentation

## 2015-06-25 DIAGNOSIS — F319 Bipolar disorder, unspecified: Secondary | ICD-10-CM | POA: Insufficient documentation

## 2015-06-25 LAB — CBC WITH DIFFERENTIAL/PLATELET
BASOS ABS: 0 10*3/uL (ref 0.0–0.1)
Basophils Absolute: 0 10*3/uL (ref 0.0–0.1)
Basophils Relative: 0 %
Basophils Relative: 0 %
EOS ABS: 0.7 10*3/uL (ref 0.0–0.7)
EOS ABS: 0.6 10*3/uL (ref 0.0–0.7)
EOS PCT: 8 %
EOS PCT: 8 %
HCT: 37.6 % — ABNORMAL LOW (ref 39.0–52.0)
HCT: 36.3 % — ABNORMAL LOW (ref 39.0–52.0)
HEMOGLOBIN: 12.3 g/dL — AB (ref 13.0–17.0)
Hemoglobin: 11.8 g/dL — ABNORMAL LOW (ref 13.0–17.0)
LYMPHS ABS: 4.1 10*3/uL — AB (ref 0.7–4.0)
Lymphocytes Relative: 47 %
Lymphocytes Relative: 45 %
Lymphs Abs: 3.7 10*3/uL (ref 0.7–4.0)
MCH: 30.8 pg (ref 26.0–34.0)
MCH: 30.5 pg (ref 26.0–34.0)
MCHC: 32.7 g/dL (ref 30.0–36.0)
MCHC: 32.5 g/dL (ref 30.0–36.0)
MCV: 94.2 fL (ref 78.0–100.0)
MCV: 93.8 fL (ref 78.0–100.0)
MONO ABS: 0.6 10*3/uL (ref 0.1–1.0)
MONOS PCT: 7 %
Monocytes Absolute: 0.5 10*3/uL (ref 0.1–1.0)
Monocytes Relative: 6 %
Neutro Abs: 3.3 10*3/uL (ref 1.7–7.7)
Neutro Abs: 3.3 10*3/uL (ref 1.7–7.7)
Neutrophils Relative %: 38 %
Neutrophils Relative %: 41 %
PLATELETS: 212 10*3/uL (ref 150–400)
PLATELETS: 183 10*3/uL (ref 150–400)
RBC: 3.99 MIL/uL — ABNORMAL LOW (ref 4.22–5.81)
RBC: 3.87 MIL/uL — AB (ref 4.22–5.81)
RDW: 13.5 % (ref 11.5–15.5)
RDW: 13.4 % (ref 11.5–15.5)
WBC: 8.7 10*3/uL (ref 4.0–10.5)
WBC: 8.2 10*3/uL (ref 4.0–10.5)

## 2015-06-25 LAB — URINALYSIS, ROUTINE W REFLEX MICROSCOPIC
BILIRUBIN URINE: NEGATIVE
BILIRUBIN URINE: NEGATIVE
Glucose, UA: NEGATIVE mg/dL
Glucose, UA: NEGATIVE mg/dL
HGB URINE DIPSTICK: NEGATIVE
Hgb urine dipstick: NEGATIVE
KETONES UR: NEGATIVE mg/dL
KETONES UR: NEGATIVE mg/dL
LEUKOCYTES UA: NEGATIVE
Leukocytes, UA: NEGATIVE
NITRITE: NEGATIVE
Nitrite: NEGATIVE
PH: 6 (ref 5.0–8.0)
PROTEIN: NEGATIVE mg/dL
Protein, ur: NEGATIVE mg/dL
SPECIFIC GRAVITY, URINE: 1.014 (ref 1.005–1.030)
Specific Gravity, Urine: 1.009 (ref 1.005–1.030)
pH: 5.5 (ref 5.0–8.0)

## 2015-06-25 LAB — COMPREHENSIVE METABOLIC PANEL
ALK PHOS: 96 U/L (ref 38–126)
ALT: 32 U/L (ref 17–63)
ALT: 29 U/L (ref 17–63)
ANION GAP: 12 (ref 5–15)
AST: 45 U/L — ABNORMAL HIGH (ref 15–41)
AST: 46 U/L — AB (ref 15–41)
Albumin: 3.8 g/dL (ref 3.5–5.0)
Albumin: 3.6 g/dL (ref 3.5–5.0)
Alkaline Phosphatase: 88 U/L (ref 38–126)
Anion gap: 11 (ref 5–15)
BUN: 8 mg/dL (ref 6–20)
BUN: 9 mg/dL (ref 6–20)
CALCIUM: 9.1 mg/dL (ref 8.9–10.3)
CHLORIDE: 105 mmol/L (ref 101–111)
CO2: 25 mmol/L (ref 22–32)
CO2: 22 mmol/L (ref 22–32)
CREATININE: 0.83 mg/dL (ref 0.61–1.24)
Calcium: 8.4 mg/dL — ABNORMAL LOW (ref 8.9–10.3)
Chloride: 105 mmol/L (ref 101–111)
Creatinine, Ser: 0.71 mg/dL (ref 0.61–1.24)
GFR calc non Af Amer: >60 mL/min (ref >60)
GFR calc non Af Amer: >60 mL/min (ref >60)
Glucose, Bld: 102 mg/dL — ABNORMAL HIGH (ref 65–99)
Glucose, Bld: 74 mg/dL (ref 65–99)
POTASSIUM: 3.6 mmol/L (ref 3.5–5.1)
Potassium: 3.9 mmol/L (ref 3.5–5.1)
SODIUM: 142 mmol/L (ref 135–145)
SODIUM: 138 mmol/L (ref 135–145)
Total Bilirubin: 0.6 mg/dL (ref 0.3–1.2)
Total Bilirubin: 0.3 mg/dL (ref 0.3–1.2)
Total Protein: 7.6 g/dL (ref 6.5–8.1)
Total Protein: 6.8 g/dL (ref 6.5–8.1)

## 2015-06-25 LAB — TROPONIN I: Troponin I: <0.03 ng/mL (ref <0.031)

## 2015-06-25 LAB — LIPASE, BLOOD
Lipase: 21 U/L (ref 11–51)
Lipase: 28 U/L (ref 11–51)

## 2015-06-25 LAB — ETHANOL

## 2015-06-25 MED ORDER — FENTANYL CITRATE (PF) 100 MCG/2ML IJ SOLN
75.0000 ug | Freq: Once | INTRAMUSCULAR | Status: AC
  Administered 2015-06-25: 75 ug via INTRAVENOUS
  Filled 2015-06-25: qty 2

## 2015-06-25 MED ORDER — MORPHINE SULFATE (PF) 4 MG/ML IV SOLN: 4.0000 mg | Freq: Once | INTRAVENOUS | Status: DC

## 2015-06-25 MED ORDER — GI COCKTAIL ~~LOC~~
30.0000 mL | Freq: Once | ORAL | Status: AC
  Administered 2015-06-25: 30 mL via ORAL
  Filled 2015-06-25: qty 30

## 2015-06-25 MED ORDER — OXYCODONE-ACETAMINOPHEN 5-325 MG PO TABS
1.0000 | ORAL_TABLET | Freq: Once | ORAL | Status: AC
  Administered 2015-06-25: 1 via ORAL
  Filled 2015-06-25: qty 1

## 2015-06-25 MED ORDER — ONDANSETRON HCL 4 MG/2ML IJ SOLN
4.0000 mg | Freq: Once | INTRAMUSCULAR | Status: AC
  Administered 2015-06-25: 4 mg via INTRAVENOUS
  Filled 2015-06-25: qty 2

## 2015-06-25 MED ORDER — KETOROLAC TROMETHAMINE 30 MG/ML IJ SOLN
30.0000 mg | Freq: Once | INTRAMUSCULAR | Status: AC
  Administered 2015-06-25: 30 mg via INTRAVENOUS
  Filled 2015-06-25: qty 1

## 2015-06-25 MED ORDER — ONDANSETRON HCL 4 MG PO TABS: 4.0000 mg | ORAL_TABLET | Freq: Four times a day (QID) | ORAL | Status: DC

## 2015-06-25 MED ORDER — SODIUM CHLORIDE 0.9 % IV BOLUS (SEPSIS)
2000.0000 mL | Freq: Once | INTRAVENOUS | Status: AC
  Administered 2015-06-25: 2000 mL via INTRAVENOUS

## 2015-06-25 NOTE — ED Notes (Signed)
Per EMS: pt c/o upper abd pain that is consistent with pancreatitis; pt seen here this am for same

## 2015-06-25 NOTE — Discharge Instructions (Signed)
Keep your scheduled follow-up appointment with gastroenterology. Take your medications (creon, carafate) as prescribed. Stay well hydrated.   Abdominal Pain, Adult Many things can cause abdominal pain. Usually, abdominal pain is not caused by a disease and will improve without treatment. It can often be observed and treated at home. Your health care provider will do a physical exam and possibly order blood tests and X-rays to help determine the seriousness of your pain. However, in many cases, more time must pass before a clear cause of the pain can be found. Before that point, your health care provider may not know if you need more testing or further treatment. HOME CARE INSTRUCTIONS Monitor your abdominal pain for any changes. The following actions may help to alleviate any discomfort you are experiencing:  Only take over-the-counter or prescription medicines as directed by your health care provider.  Do not take laxatives unless directed to do so by your health care provider.  Try a clear liquid diet (broth, tea, or water) as directed by your health care provider. Slowly move to a bland diet as tolerated. SEEK MEDICAL CARE IF:  You have unexplained abdominal pain.  You have abdominal pain associated with nausea or diarrhea.  You have pain when you urinate or have a bowel movement.  You experience abdominal pain that wakes you in the night.  You have abdominal pain that is worsened or improved by eating food.  You have abdominal pain that is worsened with eating fatty foods.  You have a fever. SEEK IMMEDIATE MEDICAL CARE IF:  Your pain does not go away within 2 hours.  You keep throwing up (vomiting).  Your pain is felt only in portions of the abdomen, such as the right side or the left lower portion of the abdomen.  You pass bloody or black tarry stools. MAKE SURE YOU:  Understand these instructions.  Will watch your condition.  Will get help right away if you are not  doing well or get worse.   This information is not intended to replace advice given to you by your health care provider. Make sure you discuss any questions you have with your health care provider.   Document Released: 02/02/2005 Document Revised: 01/14/2015 Document Reviewed: 01/02/2013 Elsevier Interactive Patient Education [employer].

## 2015-06-25 NOTE — ED Notes (Signed)
Pt drank cup of water with no difficulty

## 2015-06-25 NOTE — ED Notes (Signed)
MD at bedside. 

## 2015-06-25 NOTE — ED Notes (Signed)
Pt states he was laying down earlier this morning, when he experienced an acute onset of LUQ pain. Pt with history of pancreatitis and frequently a patient in the ER. Patient admits to etoh usage for past few days. Pt stated that he has vomited once since onset of pain.

## 2015-06-25 NOTE — ED Provider Notes (Signed)
CSN: [id number]     Arrival date & time 06/25/15  1800 History   First MD Initiated Contact with Patient 06/25/15 2156     Chief Complaint  Patient presents with  . Abdominal Pain     (Consider location/radiation/quality/duration/timing/severity/associated sxs/prior Treatment) HPI   [name] is a 45 year old male with history of hypertension, pancreatitis, polysubstance abuse, presents to the emergency department with recurrent abdominal pain. He was discharged earlier today after he is able to eat and drink without any vomiting or worsening abdominal pain. He states that he went home and ate chicken and applesauce which causes worsening abdominal pain and vomiting. He has also had 3 episodes of diarrhea that are watery. He denies hematemesis, melena, hematochezia. He denies any alcohol use today but did drink alcohol prior to his ER visit yesterday. He has had multiple admissions for alcoholic pancreatitis, most recently was January 7-15. He also admits to marijuana use this week.  Patient is also being treated for GERD/gastritis with omeprazole, Carafate and frequently gets GI cocktails when he is in the ER.  Does a history of stomach ulcers. He denies any fever, chills, sweats, lightheadedness, chest pain, shortness of breath.  Past Medical History  Diagnosis Date  . Hypertension   . Asthma   . Pancreatitis   . Cocaine abuse   . Depression   . H/O suicide attempt 10/2012  . Heart murmur     "when he was little" (03/06/2013)  . Anemia   . H/O hiatal hernia   . GERD (gastroesophageal reflux disease)   . Anxiety   . WPW (Wolff-Parkinson-White syndrome)     [name] 03/06/2013  . High cholesterol   . Femoral condyle fracture ([place]) 03/08/2014    left medial/notes 03/09/2014  . Alcoholism /alcohol abuse ([place])   . Family history of adverse reaction to anesthesia     "grandmother gets confused"  . Shortness of breath     "can happen at anytime" (03/06/2013)  . Pneumonia 1990's X  3  . Chronic bronchitis ([place])   . Sickle cell trait ([place])   . History of blood transfusion 10/2012    "when I tried to commit suicide"  . History of stomach ulcers   . Migraine     "a few times/year" (03/26/2015)  . Arthritis     "knees; arms; elbows" (03/26/2015)  . Chronic lower back pain   . Bipolar disorder ([place])   . PTSD (post-traumatic stress disorder)   . Marijuana abuse, continuous    Past Surgical History  Procedure Laterality Date  . Facial fracture surgery Left 1990's    "result of trauma"   . Eye surgery Left 1990's    "result of trauma"   . Left heart catheterization with coronary angiogram Right 03/07/2013    Procedure: LEFT HEART CATHETERIZATION WITH CORONARY ANGIOGRAM;  Surgeon: [name], MD;  Location: [facility];  Service: Cardiovascular;  Laterality: Right;  . Cardiac catheterization    . Fracture surgery    . Umbilical hernia repair    . Hernia repair     Family History  Problem Relation Age of Onset  . Hypertension Other   . Coronary artery disease Other    Social History  Substance Use Topics  . Smoking status: Current Every Day Smoker -- 1.00 packs/day for 30 years    Types: Cigarettes  . Smokeless tobacco: Current User    Types: Chew  . Alcohol Use: 37.8 oz/week    63 Cans of beer  per week     Comment: last drink was 06/18/15    Review of Systems  All other systems reviewed and are negative.  Allergies  Shellfish-derived products and Trazodone and nefazodone  Home Medications   Prior to Admission medications   Medication Sig Start Date End Date Taking? Authorizing Provider  albuterol (PROVENTIL HFA;VENTOLIN HFA) 108 (90 BASE) MCG/ACT inhaler Inhale 2 puffs into the lungs every 6 (six) hours as needed for wheezing or shortness of breath (wheezing).    Yes Historical Provider, MD  alum & mag hydroxide-simeth (MAALOX PLUS) 400-400-40 MG/5ML suspension Take 10 mLs by mouth every 6 (six) hours as needed for indigestion.   Yes Historical  Provider, MD  folic acid (FOLVITE) 1 MG tablet Take 1 tablet (1 mg total) by mouth daily. 01/15/15  Yes [name], MD  lipase/protease/amylase (CREON) 12000 UNITS CPEP capsule Take 2 capsules (24,000 Units total) by mouth 3 (three) times daily with meals. 01/15/15  Yes [name], MD  metoprolol tartrate (LOPRESSOR) 25 MG tablet Take 1 tablet (25 mg total) by mouth 2 (two) times daily. 01/15/15  Yes [name], MD  Multiple Vitamin (MULTIVITAMIN) capsule Take 1 capsule by mouth daily. 02/06/14  Yes [name], MD  omeprazole (PRILOSEC) 20 MG capsule Take 1 capsule (20 mg total) by mouth daily. 04/20/15  Yes [name], PA-C  oxyCODONE-acetaminophen (PERCOCET/ROXICET) 5-325 MG tablet Take 1 tablet by mouth every 4 (four) hours as needed for severe pain. 05/25/15  Yes [name], MD  QUEtiapine (SEROQUEL) 50 MG tablet Take 100 mg by mouth at bedtime. 01/15/15  Yes Historical Provider, MD  vitamin B-12 500 MCG tablet Take 1 tablet (500 mcg total) by mouth daily. 03/30/15  Yes [name], MD  HYDROcodone-acetaminophen (NORCO/VICODIN) 5-325 MG tablet Take 2 tablets by mouth every 4 (four) hours as needed. 06/26/15   [name], PA-C  ondansetron (ZOFRAN) 4 MG tablet Take 1 tablet (4 mg total) by mouth every 6 (six) hours. Patient not taking: Reported on 06/25/2015 06/25/15   [name], PA-C  pantoprazole (PROTONIX) 20 MG tablet Take 1 tablet (20 mg total) by mouth daily. 06/26/15   [name], PA-C  promethazine (PHENERGAN) 25 MG tablet Take 1 tablet (25 mg total) by mouth every 6 (six) hours as needed for nausea or vomiting. 06/26/15   [name], PA-C  sildenafil (VIAGRA) 50 MG tablet Take 1 tablet (50 mg total) by mouth daily as needed for erectile dysfunction. 03/31/15   [name], MD  sucralfate (CARAFATE) 1 g tablet Take 1 tablet (1 g total) by mouth 4 (four) times daily -  with meals and at bedtime. 06/26/15   [name], PA-C   BP 147/98 mmHg   Pulse 72  Temp(Src) 97.2 F (36.2 C) (Oral)  Resp 20  SpO2 100% Physical Exam  Constitutional: He is oriented to person, place, and time. He appears well-developed and well-nourished. No distress.  HENT:  Head: Normocephalic and atraumatic.  Nose: Nose normal.  Mouth/Throat: Oropharynx is clear and moist. No oropharyngeal exudate.  Eyes: Conjunctivae and EOM are normal. Pupils are equal, round, and reactive to light. Right eye exhibits no discharge. Left eye exhibits no discharge. No scleral icterus.  Neck: Normal range of motion. No JVD present. No tracheal deviation present. No thyromegaly present.  Cardiovascular: Normal rate, regular rhythm, normal heart sounds and intact distal pulses.  Exam reveals no gallop and no friction rub.   No murmur heard. Pulmonary/Chest: Effort  normal and breath sounds normal. No respiratory distress. He has no wheezes. He has no rales. He exhibits no tenderness.  Abdominal: Soft. Bowel sounds are normal. He exhibits no distension and no mass. There is tenderness. There is no rebound and no guarding.  Epigastric, left upper quadrant and left lower quadrant tenderness to palpation with voluntary guarding, no rebound tenderness  Musculoskeletal: Normal range of motion. He exhibits no edema or tenderness.  Lymphadenopathy:    He has no cervical adenopathy.  Neurological: He is alert and oriented to person, place, and time. He has normal reflexes. No cranial nerve deficit. He exhibits normal muscle tone. Coordination normal.  Skin: Skin is warm and dry. No rash noted. He is not diaphoretic. No erythema. No pallor.  Psychiatric: He has a normal mood and affect. His behavior is normal. Judgment and thought content normal.  Nursing note and vitals reviewed.   ED Course  Procedures (including critical care time) Labs Review Labs Reviewed  CBC WITH DIFFERENTIAL/PLATELET - Abnormal; Notable for the following:    RBC 3.87 (*)    Hemoglobin 11.8 (*)    HCT  36.3 (*)    All other components within normal limits  COMPREHENSIVE METABOLIC PANEL - Abnormal; Notable for the following:    Calcium 8.4 (*)    AST 46 (*)    All other components within normal limits  LIPASE, BLOOD  ETHANOL  TROPONIN I  URINALYSIS, ROUTINE W REFLEX MICROSCOPIC (NOT AT [facility])    Imaging Review No results found. I have personally reviewed and evaluated these images and lab results as part of my medical decision-making.   EKG Interpretation   Date/Time:  Thursday June 25 2015 22:58:08 EST Ventricular Rate:  79 PR Interval:  120 QRS Duration: 75 QT Interval:  341 QTC Calculation: 391 R Axis:   81 Text Interpretation:  Sinus rhythm LAE, consider biatrial enlargement  Nonspecific T abnormalities, lateral leads Minimal ST elevation, anterior  leads No significant change was found Nonspecific ST and T wave  abnormality Confirmed by Wyvonnia Dusky  MD, STEPHEN [phone]) on 06/25/2015  11:02:15 PM      MDM   45 year old male with abdominal pain, history of polysubstance abuse and pancreatitis He was discharged from the ER approximately 10 hours before he presented back to the ER with abdominal pain, vomiting and diarrhea. He ate applesauce and she can and has not been compliant with his medications.  He was given fluid boluses, antiemetics, pain medication, GI cocktail and Carafate in the ER and reports improvement of his pain. He was sleeping comfortably and was able to tolerate PO's.   He was discharged in improved condition with stable vital signs, he was instructed to maintain a clear liquid diet for the first few days and take his medication as prescribed.  Final diagnoses:  Left upper quadrant pain    [name], PA-C 06/26/15 0123  Ezequiel Essex, MD 06/26/15 1012

## 2015-06-25 NOTE — ED Provider Notes (Signed)
CSN: [id number]     Arrival date & time 06/25/15  0545 History   First MD Initiated Contact with Patient 06/25/15 [phone]     Chief Complaint  Patient presents with  . Abdominal Pain   HPI  [name] is a 45 year old male with PMHx of HTN, chronic pancreatitis, cocaine abuse, alcohol abuse and marijuana use presenting with abdominal pain. Onset of pain was a few hours prior to arrival. He states he had acute onset of left upper quadrant pain. He admits to drinking alcohol last evening and states that his pain began after he tried to eat some broth. He states that his pain is typical of his chronic pancreatitis episodes. He states the pain is sharp and shooting. He also endorses associated vomiting. He has had one episode of NBNB vomiting. He also endorses one episode of non-bloody diarrhea. He states that whenever he has with pancreatitis pain he always has vomiting and diarrhea. He reports taking Phenergan and Carafate prior to arrival without improvement in his symptoms. He is seen frequently in the emergency department for the same complaint; he has been seen 26 times in the past 6 months for this. He has never followed with a gastroenterologist. He has no other complaints today.  Chart review: Patient was seen on 2/9 at [facility] with the same complaints. He had a normal workup and left escorted by security when he did not receive narcotics. He then presented to [facility] same day seeking further pain control.  Past Medical History  Diagnosis Date  . Hypertension   . Asthma   . Pancreatitis   . Cocaine abuse   . Depression   . H/O suicide attempt 10/2012  . Heart murmur     "when he was little" (03/06/2013)  . Anemia   . H/O hiatal hernia   . GERD (gastroesophageal reflux disease)   . Anxiety   . WPW (Wolff-Parkinson-White syndrome)     [name] 03/06/2013  . High cholesterol   . Femoral condyle fracture ([place]) 03/08/2014    left medial/notes 03/09/2014  . Alcoholism /alcohol abuse  ([place])   . Family history of adverse reaction to anesthesia     "grandmother gets confused"  . Shortness of breath     "can happen at anytime" (03/06/2013)  . Pneumonia 1990's X 3  . Chronic bronchitis ([place])   . Sickle cell trait ([place])   . History of blood transfusion 10/2012    "when I tried to commit suicide"  . History of stomach ulcers   . Migraine     "a few times/year" (03/26/2015)  . Arthritis     "knees; arms; elbows" (03/26/2015)  . Chronic lower back pain   . Bipolar disorder ([place])   . PTSD (post-traumatic stress disorder)   . Marijuana abuse, continuous    Past Surgical History  Procedure Laterality Date  . Facial fracture surgery Left 1990's    "result of trauma"   . Eye surgery Left 1990's    "result of trauma"   . Left heart catheterization with coronary angiogram Right 03/07/2013    Procedure: LEFT HEART CATHETERIZATION WITH CORONARY ANGIOGRAM;  Surgeon: [name], MD;  Location: [facility];  Service: Cardiovascular;  Laterality: Right;  . Cardiac catheterization    . Fracture surgery    . Umbilical hernia repair    . Hernia repair     Family History  Problem Relation Age of Onset  . Hypertension Other   . Coronary  artery disease Other    Social History  Substance Use Topics  . Smoking status: Current Every Day Smoker -- 1.00 packs/day for 30 years    Types: Cigarettes  . Smokeless tobacco: Current User    Types: Chew  . Alcohol Use: 37.8 oz/week    63 Cans of beer per week     Comment: last drink was 06/18/15    Review of Systems  Gastrointestinal: Positive for nausea, vomiting, abdominal pain and diarrhea.  All other systems reviewed and are negative.     Allergies  Shellfish-derived products and Trazodone and nefazodone  Home Medications   Prior to Admission medications   Medication Sig Start Date End Date Taking? Authorizing Provider  albuterol (PROVENTIL HFA;VENTOLIN HFA) 108 (90 BASE) MCG/ACT inhaler Inhale 2 puffs into the lungs  every 6 (six) hours as needed for wheezing or shortness of breath (wheezing).    Yes Historical Provider, MD  alum & mag hydroxide-simeth (MAALOX PLUS) 400-400-40 MG/5ML suspension Take 10 mLs by mouth every 6 (six) hours as needed for indigestion.   Yes Historical Provider, MD  folic acid (FOLVITE) 1 MG tablet Take 1 tablet (1 mg total) by mouth daily. 01/15/15  Yes [name], MD  lipase/protease/amylase (CREON) 12000 UNITS CPEP capsule Take 2 capsules (24,000 Units total) by mouth 3 (three) times daily with meals. 01/15/15  Yes [name], MD  metoprolol tartrate (LOPRESSOR) 25 MG tablet Take 1 tablet (25 mg total) by mouth 2 (two) times daily. 01/15/15  Yes [name], MD  Multiple Vitamin (MULTIVITAMIN) capsule Take 1 capsule by mouth daily. 02/06/14  Yes [name], MD  omeprazole (PRILOSEC) 20 MG capsule Take 1 capsule (20 mg total) by mouth daily. 04/20/15  Yes [name], PA-C  oxyCODONE-acetaminophen (PERCOCET/ROXICET) 5-325 MG tablet Take 1 tablet by mouth every 4 (four) hours as needed for severe pain. 05/25/15  Yes [name], MD  promethazine (PHENERGAN) 25 MG tablet Take 1 tablet (25 mg total) by mouth every 6 (six) hours as needed for nausea or vomiting. 05/25/15  Yes [name], MD  QUEtiapine (SEROQUEL) 50 MG tablet Take 100 mg by mouth at bedtime. 01/15/15  Yes Historical Provider, MD  sildenafil (VIAGRA) 50 MG tablet Take 1 tablet (50 mg total) by mouth daily as needed for erectile dysfunction. 03/31/15  Yes Olugbemiga Essie Christine, MD  sucralfate (CARAFATE) 1 G tablet Take 1 tablet (1 g total) by mouth 4 (four) times daily -  with meals and at bedtime. 03/15/15  Yes Shari Upstill, PA-C  vitamin B-12 500 MCG tablet Take 1 tablet (500 mcg total) by mouth daily. 03/30/15  Yes [name], MD  ondansetron (ZOFRAN) 4 MG tablet Take 1 tablet (4 mg total) by mouth every 6 (six) hours. 06/25/15   Aowyn Rozeboom, PA-C   BP 132/81 mmHg  Pulse 84   Temp(Src) 98.3 F (36.8 C) (Oral)  Resp 16  SpO2 99% Physical Exam  Constitutional: He appears well-developed and well-nourished. No distress.  HENT:  Head: Normocephalic and atraumatic.  Eyes: Conjunctivae are normal. Right eye exhibits no discharge. Left eye exhibits no discharge. No scleral icterus.  Neck: Normal range of motion.  Cardiovascular: Normal rate and regular rhythm.   Pulmonary/Chest: Effort normal. No respiratory distress.  Abdominal: Soft. Bowel sounds are normal. There is tenderness. There is no rebound and no guarding.  Mild tenderness in the left upper quadrant. No rebound, guarding or rigidity.  Musculoskeletal: Normal range of motion.  Neurological: He is alert. Coordination normal.  Skin: Skin is warm and dry.  Psychiatric: He has a normal mood and affect. His behavior is normal.  Nursing note and vitals reviewed.   ED Course  Procedures (including critical care time) Labs Review Labs Reviewed  CBC WITH DIFFERENTIAL/PLATELET - Abnormal; Notable for the following:    RBC 3.99 (*)    Hemoglobin 12.3 (*)    HCT 37.6 (*)    Lymphs Abs 4.1 (*)    All other components within normal limits  COMPREHENSIVE METABOLIC PANEL - Abnormal; Notable for the following:    Glucose, Bld 102 (*)    AST 45 (*)    All other components within normal limits  LIPASE, BLOOD  URINALYSIS, ROUTINE W REFLEX MICROSCOPIC (NOT AT [facility])    Imaging Review No results found. I have personally reviewed and evaluated these images and lab results as part of my medical decision-making.   EKG Interpretation None      MDM   Final diagnoses:  Chronic abdominal pain   45 year old male presenting with abdominal pain, nausea, vomiting and diarrhea 1 day. Patient has been seen 26 times in the past 6 months for same complaint related to his chronic pancreatitis secondary to alcohol abuse. Admits to alcohol use last evening. No change in presentation today. Nontoxic appearing. Abdomen is  soft with mild tenderness in the left upper quadrant. No peritoneal signs suggesting surgical abdomen. Blood work is reassuring. Hemoglobin 12.3 which is at his baseline per chart review. No lipase elevation. No indication for imaging at this time as this is a chronic pain complaint. Patient has his prescription bottles with him and reports compliance with the medications. Bottles of Creon, Carafate, Phenergan and omeprazole are full and last refilled in 2016. Bottle of Percocet filled one month ago with 2 pills remaining. Symptoms treated in the emergency department with IV fluids, Zofran, GI cocktail, Toradol and Percocet. Patient states he has a gastroenterolgy appointment scheduled with [facility] and he has not seen them yet. Strongly encouraged patient to keep this gastroenterology appointment as he has never been evaluated by GI before. Also discussed cessation of alcohol. Patient is in no acute distress and is stable for discharge with appropriate outpatient follow-up. Return precautions given in discharge paperwork and discussed with pt at bedside. Pt stable for discharge     Josephina Gip, PA-C 06/25/15 0845  Everlene Balls, MD 06/25/15 1538

## 2015-06-26 MED ORDER — HYDROCODONE-ACETAMINOPHEN 5-325 MG PO TABS: 2.0000 | ORAL_TABLET | ORAL | Status: DC | PRN

## 2015-06-26 MED ORDER — SUCRALFATE 1 G PO TABS: 1.0000 g | ORAL_TABLET | Freq: Three times a day (TID) | ORAL | Status: DC

## 2015-06-26 MED ORDER — PROMETHAZINE HCL 25 MG PO TABS: 25.0000 mg | ORAL_TABLET | Freq: Four times a day (QID) | ORAL | Status: DC | PRN

## 2015-06-26 MED ORDER — PANTOPRAZOLE SODIUM 20 MG PO TBEC: 20.0000 mg | DELAYED_RELEASE_TABLET | Freq: Every day | ORAL | Status: DC

## 2015-06-26 MED ORDER — SUCRALFATE 1 G PO TABS
1.0000 g | ORAL_TABLET | Freq: Once | ORAL | Status: AC
Start: 2015-06-26 — End: 2015-06-26
  Administered 2015-06-26: 1 g via ORAL
  Filled 2015-06-26: qty 1

## 2015-06-26 NOTE — Discharge Instructions (Signed)
Acute Pancreatitis Acute pancreatitis is a disease in which the pancreas becomes suddenly inflamed. The pancreas is a large gland located behind your stomach. The pancreas produces enzymes that help digest food. The pancreas also releases the hormones glucagon and insulin that help regulate blood sugar. Damage to the pancreas occurs when the digestive enzymes from the pancreas are activated and begin attacking the pancreas before being released into the intestine. Most acute attacks last a couple of days and can cause serious complications. Some people become dehydrated and develop low blood pressure. In severe cases, bleeding into the pancreas can lead to shock and can be life-threatening. The lungs, heart, and kidneys may fail. CAUSES  Pancreatitis can happen to anyone. In some cases, the cause is unknown. Most cases are caused by:  Alcohol abuse.  Gallstones. Other less common causes are:  Certain medicines.  Exposure to certain chemicals.  Infection.  Damage caused by an accident (trauma).  Abdominal surgery. SYMPTOMS   Pain in the upper abdomen that may radiate to the back.  Tenderness and swelling of the abdomen.  Nausea and vomiting. DIAGNOSIS  Your caregiver will perform a physical exam. Blood and stool tests may be done to confirm the diagnosis. Imaging tests may also be done, such as X-rays, CT scans, or an ultrasound of the abdomen. TREATMENT  Treatment usually requires a stay in the hospital. Treatment may include:  Pain medicine.  Fluid replacement through an intravenous line (IV).  Placing a tube in the stomach to remove stomach contents and control vomiting.  Not eating for 3 or 4 days. This gives your pancreas a rest, because enzymes are not being produced that can cause further damage.  Antibiotic medicines if your condition is caused by an infection.  Surgery of the pancreas or gallbladder. HOME CARE INSTRUCTIONS   Follow the diet advised by your  caregiver. This may involve avoiding alcohol and decreasing the amount of fat in your diet.  Eat smaller, more frequent meals. This reduces the amount of digestive juices the pancreas produces.  Drink enough fluids to keep your urine clear or pale yellow.  Only take over-the-counter or prescription medicines as directed by your caregiver.  Avoid drinking alcohol if it caused your condition.  Do not smoke.  Get plenty of rest.  Check your blood sugar at home as directed by your caregiver.  Keep all follow-up appointments as directed by your caregiver. SEEK MEDICAL CARE IF:   You do not recover as quickly as expected.  You develop new or worsening symptoms.  You have persistent pain, weakness, or nausea.  You recover and then have another episode of pain. SEEK IMMEDIATE MEDICAL CARE IF:   You are unable to eat or keep fluids down.  Your pain becomes severe.  You have a fever or persistent symptoms for more than 2 to 3 days.  You have a fever and your symptoms suddenly get worse.  Your skin or the white part of your eyes turn yellow (jaundice).  You develop vomiting.  You feel dizzy, or you faint.  Your blood sugar is high (over 300 mg/dL). MAKE SURE YOU:   Understand these instructions.  Will watch your condition.  Will get help right away if you are not doing well or get worse.   This information is not intended to replace advice given to you by your health care provider. Make sure you discuss any questions you have with your health care provider.   Document Released: 04/25/2005 Document Revised: 10/25/2011  Document Reviewed: 08/04/2011 Elsevier Interactive Patient Education 2016 Elsevier Inc.   Abdominal Pain, Adult Many things can cause abdominal pain. Usually, abdominal pain is not caused by a disease and will improve without treatment. It can often be observed and treated at home. Your health care provider will do a physical exam and possibly order blood  tests and X-rays to help determine the seriousness of your pain. However, in many cases, more time must pass before a clear cause of the pain can be found. Before that point, your health care provider may not know if you need more testing or further treatment. HOME CARE INSTRUCTIONS Monitor your abdominal pain for any changes. The following actions may help to alleviate any discomfort you are experiencing:  Only take over-the-counter or prescription medicines as directed by your health care provider.  Do not take laxatives unless directed to do so by your health care provider.  Try a clear liquid diet (broth, tea, or water) as directed by your health care provider. Slowly move to a bland diet as tolerated. SEEK MEDICAL CARE IF:  You have unexplained abdominal pain.  You have abdominal pain associated with nausea or diarrhea.  You have pain when you urinate or have a bowel movement.  You experience abdominal pain that wakes you in the night.  You have abdominal pain that is worsened or improved by eating food.  You have abdominal pain that is worsened with eating fatty foods.  You have a fever. SEEK IMMEDIATE MEDICAL CARE IF:  Your pain does not go away within 2 hours.  You keep throwing up (vomiting).  Your pain is felt only in portions of the abdomen, such as the right side or the left lower portion of the abdomen.  You pass bloody or black tarry stools. MAKE SURE YOU:  Understand these instructions.  Will watch your condition.  Will get help right away if you are not doing well or get worse.   This information is not intended to replace advice given to you by your health care provider. Make sure you discuss any questions you have with your health care provider.   Document Released: 02/02/2005 Document Revised: 01/14/2015 Document Reviewed: 01/02/2013 Elsevier Interactive Patient Education 2016 Elsevier Inc. Gastritis, Adult Gastritis is soreness and swelling  (inflammation) of the lining of the stomach. Gastritis can develop as a sudden onset (acute) or long-term (chronic) condition. If gastritis is not treated, it can lead to stomach bleeding and ulcers. CAUSES  Gastritis occurs when the stomach lining is weak or damaged. Digestive juices from the stomach then inflame the weakened stomach lining. The stomach lining may be weak or damaged due to viral or bacterial infections. One common bacterial infection is the Helicobacter pylori infection. Gastritis can also result from excessive alcohol consumption, taking certain medicines, or having too much acid in the stomach.  SYMPTOMS  In some cases, there are no symptoms. When symptoms are present, they may include:  Pain or a burning sensation in the upper abdomen.  Nausea.  Vomiting.  An uncomfortable feeling of fullness after eating. DIAGNOSIS  Your caregiver may suspect you have gastritis based on your symptoms and a physical exam. To determine the cause of your gastritis, your caregiver may perform the following:  Blood or stool tests to check for the H pylori bacterium.  Gastroscopy. A thin, flexible tube (endoscope) is passed down the esophagus and into the stomach. The endoscope has a light and camera on the end. Your caregiver uses the endoscope  to view the inside of the stomach.  Taking a tissue sample (biopsy) from the stomach to examine under a microscope. TREATMENT  Depending on the cause of your gastritis, medicines may be prescribed. If you have a bacterial infection, such as an H pylori infection, antibiotics may be given. If your gastritis is caused by too much acid in the stomach, H2 blockers or antacids may be given. Your caregiver may recommend that you stop taking aspirin, ibuprofen, or other nonsteroidal anti-inflammatory drugs (NSAIDs). HOME CARE INSTRUCTIONS  Only take over-the-counter or prescription medicines as directed by your caregiver.  If you were given antibiotic  medicines, take them as directed. Finish them even if you start to feel better.  Drink enough fluids to keep your urine clear or pale yellow.  Avoid foods and drinks that make your symptoms worse, such as:  Caffeine or alcoholic drinks.  Chocolate.  Peppermint or mint flavorings.  Garlic and onions.  Spicy foods.  Citrus fruits, such as oranges, lemons, or limes.  Tomato-based foods such as sauce, chili, salsa, and pizza.  Fried and fatty foods.  Eat small, frequent meals instead of large meals. SEEK IMMEDIATE MEDICAL CARE IF:   You have black or dark red stools.  You vomit blood or material that looks like coffee grounds.  You are unable to keep fluids down.  Your abdominal pain gets worse.  You have a fever.  You do not feel better after 1 week.  You have any other questions or concerns. MAKE SURE YOU:  Understand these instructions.  Will watch your condition.  Will get help right away if you are not doing well or get worse.   This information is not intended to replace advice given to you by your health care provider. Make sure you discuss any questions you have with your health care provider.   Document Released: 04/19/2001 Document Revised: 10/25/2011 Document Reviewed: 06/08/2011 Elsevier Interactive Patient Education [employer].

## 2015-06-26 NOTE — ED Notes (Signed)
Patient left at this time with all belongings. 

## 2015-06-26 NOTE — ED Notes (Signed)
Pt tolerating fluids, reports decreased pain. Sleeping when RN entered room.

## 2015-06-28 ENCOUNTER — Emergency Department (HOSPITAL_COMMUNITY)
Admission: EM | Admit: 2015-06-28 | Discharge: 2015-06-28 | Disposition: A | Discharge status: Home / Self Care | Payer: No Typology Code available for payment source | Attending: Emergency Medicine | Admitting: Emergency Medicine

## 2015-06-28 ENCOUNTER — Encounter (HOSPITAL_COMMUNITY): Payer: Self-pay

## 2015-06-28 ENCOUNTER — Encounter (HOSPITAL_COMMUNITY): Payer: Self-pay | Admitting: *Deleted

## 2015-06-28 ENCOUNTER — Encounter (HOSPITAL_COMMUNITY): Payer: Self-pay | Admitting: Emergency Medicine

## 2015-06-28 DIAGNOSIS — G8929 Other chronic pain: Secondary | ICD-10-CM | POA: Insufficient documentation

## 2015-06-28 DIAGNOSIS — I10 Essential (primary) hypertension: Secondary | ICD-10-CM | POA: Insufficient documentation

## 2015-06-28 DIAGNOSIS — R011 Cardiac murmur, unspecified: Secondary | ICD-10-CM | POA: Insufficient documentation

## 2015-06-28 DIAGNOSIS — Z9889 Other specified postprocedural states: Secondary | ICD-10-CM | POA: Insufficient documentation

## 2015-06-28 DIAGNOSIS — F419 Anxiety disorder, unspecified: Secondary | ICD-10-CM | POA: Insufficient documentation

## 2015-06-28 DIAGNOSIS — D649 Anemia, unspecified: Secondary | ICD-10-CM | POA: Insufficient documentation

## 2015-06-28 DIAGNOSIS — F17210 Nicotine dependence, cigarettes, uncomplicated: Secondary | ICD-10-CM | POA: Insufficient documentation

## 2015-06-28 DIAGNOSIS — F4310 Post-traumatic stress disorder, unspecified: Secondary | ICD-10-CM | POA: Insufficient documentation

## 2015-06-28 DIAGNOSIS — F319 Bipolar disorder, unspecified: Secondary | ICD-10-CM | POA: Insufficient documentation

## 2015-06-28 DIAGNOSIS — J45909 Unspecified asthma, uncomplicated: Secondary | ICD-10-CM | POA: Insufficient documentation

## 2015-06-28 DIAGNOSIS — Z8701 Personal history of pneumonia (recurrent): Secondary | ICD-10-CM | POA: Insufficient documentation

## 2015-06-28 DIAGNOSIS — M1990 Unspecified osteoarthritis, unspecified site: Secondary | ICD-10-CM | POA: Insufficient documentation

## 2015-06-28 DIAGNOSIS — Z8781 Personal history of (healed) traumatic fracture: Secondary | ICD-10-CM | POA: Insufficient documentation

## 2015-06-28 DIAGNOSIS — E7800 Pure hypercholesterolemia, unspecified: Secondary | ICD-10-CM | POA: Insufficient documentation

## 2015-06-28 DIAGNOSIS — Z79899 Other long term (current) drug therapy: Secondary | ICD-10-CM | POA: Insufficient documentation

## 2015-06-28 DIAGNOSIS — R109 Unspecified abdominal pain: Secondary | ICD-10-CM | POA: Insufficient documentation

## 2015-06-28 DIAGNOSIS — Z915 Personal history of self-harm: Secondary | ICD-10-CM | POA: Insufficient documentation

## 2015-06-28 DIAGNOSIS — R1013 Epigastric pain: Secondary | ICD-10-CM | POA: Insufficient documentation

## 2015-06-28 DIAGNOSIS — K219 Gastro-esophageal reflux disease without esophagitis: Secondary | ICD-10-CM | POA: Insufficient documentation

## 2015-06-28 DIAGNOSIS — M158 Other polyosteoarthritis: Secondary | ICD-10-CM | POA: Insufficient documentation

## 2015-06-28 MED ORDER — GI COCKTAIL ~~LOC~~
30.0000 mL | Freq: Once | ORAL | Status: AC
Start: 2015-06-28 — End: 2015-06-28
  Administered 2015-06-28: 30 mL via ORAL
  Filled 2015-06-28: qty 30

## 2015-06-28 NOTE — ED Provider Notes (Signed)
CSN: [id number]     Arrival date & time 06/28/15  0555 History   First MD Initiated Contact with Patient 06/28/15 0600     Chief Complaint  Patient presents with  . Abdominal Pain     HPI   [name] is an 45 y.o. male with chronic abdominal pain, alcohol induced pancreatitis, polysubstance abuse who presents to the ED for evaluation of epigastric pain. He states his pain started 11 PM last night. Denies N/V. He states he was given oxycodone prescription a few days ago which has provided no relief. He states he took three pills last night. He has his bottles for carafate, phenergan, creon which he states he has been taking as prescribed with no relief. He denies new symptoms today. Denies fever or chills. States he has GI f/u scheduled for next month. He endorses still drinking EtOH occasionally, last use three nights ago. Denies MJ or other drug.s  Of note, pt is very well known to the ED for many recent presentations for same complaint. He has a new Care Plan in place now that includes no narcotics for his chronic abdominal pain.   Past Medical History  Diagnosis Date  . Hypertension   . Asthma   . Pancreatitis   . Cocaine abuse   . Depression   . H/O suicide attempt 10/2012  . Heart murmur     "when he was little" (03/06/2013)  . Anemia   . H/O hiatal hernia   . GERD (gastroesophageal reflux disease)   . Anxiety   . WPW (Wolff-Parkinson-White syndrome)     [name] 03/06/2013  . High cholesterol   . Femoral condyle fracture ([place]) 03/08/2014    left medial/notes 03/09/2014  . Alcoholism /alcohol abuse ([place])   . Family history of adverse reaction to anesthesia     "grandmother gets confused"  . Shortness of breath     "can happen at anytime" (03/06/2013)  . Pneumonia 1990's X 3  . Chronic bronchitis ([place])   . Sickle cell trait ([place])   . History of blood transfusion 10/2012    "when I tried to commit suicide"  . History of stomach ulcers   . Migraine     "a few times/year"  (03/26/2015)  . Arthritis     "knees; arms; elbows" (03/26/2015)  . Chronic lower back pain   . Bipolar disorder ([place])   . PTSD (post-traumatic stress disorder)   . Marijuana abuse, continuous    Past Surgical History  Procedure Laterality Date  . Facial fracture surgery Left 1990's    "result of trauma"   . Eye surgery Left 1990's    "result of trauma"   . Left heart catheterization with coronary angiogram Right 03/07/2013    Procedure: LEFT HEART CATHETERIZATION WITH CORONARY ANGIOGRAM;  Surgeon: [name], MD;  Location: [facility];  Service: Cardiovascular;  Laterality: Right;  . Cardiac catheterization    . Fracture surgery    . Umbilical hernia repair    . Hernia repair     Family History  Problem Relation Age of Onset  . Hypertension Other   . Coronary artery disease Other    Social History  Substance Use Topics  . Smoking status: Current Every Day Smoker -- 1.00 packs/day for 30 years    Types: Cigarettes  . Smokeless tobacco: Current User    Types: Chew  . Alcohol Use: 37.8 oz/week    63 Cans of beer per week     Comment:  last drink was 06/18/15    Review of Systems  All other systems reviewed and are negative.     Allergies  Shellfish-derived products and Trazodone and nefazodone  Home Medications   Prior to Admission medications   Medication Sig Start Date End Date Taking? Authorizing Provider  albuterol (PROVENTIL HFA;VENTOLIN HFA) 108 (90 BASE) MCG/ACT inhaler Inhale 2 puffs into the lungs every 6 (six) hours as needed for wheezing or shortness of breath (wheezing).     Historical Provider, MD  alum & mag hydroxide-simeth (MAALOX PLUS) 400-400-40 MG/5ML suspension Take 10 mLs by mouth every 6 (six) hours as needed for indigestion.    Historical Provider, MD  folic acid (FOLVITE) 1 MG tablet Take 1 tablet (1 mg total) by mouth daily. 01/15/15   [name], MD  HYDROcodone-acetaminophen (NORCO/VICODIN) 5-325 MG tablet Take 2 tablets by mouth  every 4 (four) hours as needed. 06/26/15   [name], PA-C  lipase/protease/amylase (CREON) 12000 UNITS CPEP capsule Take 2 capsules (24,000 Units total) by mouth 3 (three) times daily with meals. 01/15/15   [name], MD  metoprolol tartrate (LOPRESSOR) 25 MG tablet Take 1 tablet (25 mg total) by mouth 2 (two) times daily. 01/15/15   [name], MD  Multiple Vitamin (MULTIVITAMIN) capsule Take 1 capsule by mouth daily. 02/06/14   [name], MD  omeprazole (PRILOSEC) 20 MG capsule Take 1 capsule (20 mg total) by mouth daily. 04/20/15   [name], PA-C  ondansetron (ZOFRAN) 4 MG tablet Take 1 tablet (4 mg total) by mouth every 6 (six) hours. Patient not taking: Reported on 06/25/2015 06/25/15   [name], PA-C  oxyCODONE-acetaminophen (PERCOCET/ROXICET) 5-325 MG tablet Take 1 tablet by mouth every 4 (four) hours as needed for severe pain. 05/25/15   [name], MD  pantoprazole (PROTONIX) 20 MG tablet Take 1 tablet (20 mg total) by mouth daily. 06/26/15   [name], PA-C  promethazine (PHENERGAN) 25 MG tablet Take 1 tablet (25 mg total) by mouth every 6 (six) hours as needed for nausea or vomiting. 06/26/15   [name], PA-C  QUEtiapine (SEROQUEL) 50 MG tablet Take 100 mg by mouth at bedtime. 01/15/15   Historical Provider, MD  sildenafil (VIAGRA) 50 MG tablet Take 1 tablet (50 mg total) by mouth daily as needed for erectile dysfunction. 03/31/15   [name], MD  sucralfate (CARAFATE) 1 g tablet Take 1 tablet (1 g total) by mouth 4 (four) times daily -  with meals and at bedtime. 06/26/15   [name], PA-C  vitamin B-12 500 MCG tablet Take 1 tablet (500 mcg total) by mouth daily. 03/30/15   [name], MD   BP 146/85 mmHg  Pulse 90  Temp(Src) 97.3 F (36.3 C) (Oral)  Resp 19  SpO2 98% Physical Exam  Constitutional: He is oriented to person, place, and time. No distress.  Pt resting comfortably on multiple walk-by's. During our conversation he  is holding his abdomen.   HENT:  Head: Atraumatic.  Right Ear: External ear normal.  Left Ear: External ear normal.  Nose: Nose normal.  Eyes: Conjunctivae are normal. No scleral icterus.  Neck: Normal range of motion. Neck supple.  Cardiovascular: Normal rate and regular rhythm.   Pulmonary/Chest: Effort normal. No respiratory distress. He exhibits no tenderness.  Abdominal: Soft. He exhibits no distension. There is no tenderness. There is no rigidity, no rebound and no guarding.  Neurological: He is alert and oriented to person, place, and  time.  Skin: Skin is warm and dry. He is not diaphoretic.  Psychiatric: He has a normal mood and affect. His behavior is normal.  Nursing note and vitals reviewed.   ED Course  Procedures (including critical care time) Labs Review Labs Reviewed - No data to display  Imaging Review No results found. I have personally reviewed and evaluated these images and lab results as part of my medical decision-making.   EKG Interpretation None      MDM   Final diagnoses:  Chronic abdominal pain    Pt is an 45 y.o. male well known to this ED for chronic abdominal pain. Care plan reviewed by me. I believe his pain to be chronic today. He is afebrile, vitals otherwise unremarkable, with a completely benign exam. On several occasions I have walked by his room and he is resting comfortably. Per Care Plan, no narcotics or labs indicated today. I offered GI cocktail. Strongly encouraged him to take his home meds as prescribed. Strongly encouraged close PCP f/u, and complete abstinence from EtOH and other drugs. Pt is stable for discharge and outpatient follow up.     Anne Ng, Hershal Coria 06/28/15 [id number]  April Palumbo, MD 06/28/15 2342

## 2015-06-28 NOTE — Discharge Instructions (Signed)
Please follow up with your primary care provider this week ASAP. Please follow up with GI as scheduled. As we discussed, I highly encourage you to stop drinking alcohol completely.   Community Resource Guide Outpatient Counseling/Substance Abuse Adult The [employer] 211 is a great source of information about community services available.  Access by dialing 2-1-1 from anywhere in [place], or by website -  CustodianSupply.fi.   Other Local Resources (Updated 05/2015)  [facility]  Crisis Hotline, available 24 hours a day, 7 days a week: [place], [place]   [facility]  Crisis Hotline, available 24 hours a day, 7 days a week: [place], [place]  [facility]  Suicide Prevention Hotline, available 24 hours a day, 7 days a week: [place], [place], available 24 hours a day, 7 days a week: [place], [place] Access to [employer], available 24 hours a day, 7 days a week: 609-878-1577 All   [facility]  Crisis Hotline, available 24 hours a day, 7 days a week: [phone] All   Other Local Resources (Updated 05/2015)  Outpatient Counseling/ Substance Abuse Programs  Services     Address and Phone Number  ADS ([facility]cohol and Drug Services)   Options include Individual counseling, group counseling, intensive outpatient program (several hours a day, several days a week)  Offers depression assessments  Provides methadone maintenance program [phone] [address], [place], [place] partial hospitalization/day treatment and DUI/DWI programs  [employer], private insurance [phone] [phone] [place], [place] [address]  [place] include intensive outpatient program  (several hours a day, several days a week), outpatient treatment, DUI/DWI services, family education  Also has some services specifically for [employer] transitional housing  [phone] [address] [place], [place] [address]     [place] Medicare, private pay, and private insurance [phone] [address] [place], [place] [address]  [facility]  Services include individual counseling, substance abuse intensive outpatient program (several hours a day, several days a week), day treatment  Blinda [place], Medicaid, private insurance [phone] [address], [place], [place] [address]  [facility]   Offers substance abuse intensive outpatient program (several hours a day, several days a week), partial hospitalization program [phone] [address] [place], [place] [address]  [phone] [address], [place] [address]  [phone] [place], [place] [address]  [phone], [place], [place] [address]  [facility]  Individual counseling only  Accepts private insurance only [phone] [address] [place], [place] [address]  [facility]one Clinic  Methadone maintenance program [id number] [place], [place] [address]  [facility] providing substance abuse and mental health counseling  Accepts Medicaid, Medicare, private insurance  Offers sliding scale for uninsured [phone] [place], [place] in [place] individual counseling, and intensive in-home services [phone] [address] [place], [place] [address]  [facility] individual counseling, family counseling, group therapy, domestic violence counseling, Therapist, music, Medicaid, private insurance  Offers sliding scale for uninsured [phone] [address] [place], [place] [address]  [phone] [facility], [address] [place], [place]  [facility]  Offers individual, family and group counseling  3 locations - [place], [place], and [place]  [address] [place], [place] [address]  [address] [place], [place] [address]  [place], [place] [address]  [facility]    Offers psychiatric assessment, 8-week Intensive Outpatient Program (several hours a day, several times a week, daytime or evenings), early recovery group, family Program, medication management  Private pay or private insurance only [phone], or  [phone] [address] [place], [place] [address]  [facility]  Offers individual, couples and family counseling  Accepts Medicaid, private insurance, and sliding scale for uninsured [phone] [address] [place], [place] [address]  Launa Flight, MD  Individual counseling  Private insurance [phone] [place], [place] [address]  [facility]   Offers assessment, substance abuse treatment, and behavioral health treatment [phone] [address] [place], [place] [address]  [facility]  Individual counseling  Accepts private insurance [phone] [place], [place] [address]  [facility]  Individual counseling  Blinda Leatherwood, private insurance [phone] [place], [place] [address]  [facility]    Offers intensive outpatient program (several hours a day, several times a week)  Private pay, private insurance [phone] [place], [place]  Individual counseling  Medicare, private insurance [phone] [address], [place], [place] [address]  [place]    Offers intensive outpatient program (several hours a day, several times a week) and partial hospitalization program [phone] [place], [place] [address]  Letta Moynahan, MD  Individual counseling [phone] [address], [place], [place] [address]  [place] counseling to individuals, couples, and families  Accepts Medicare and private insurance; offers sliding scale for uninsured [phone] [place], [place] [address]  [facility]  Christian counseling [phone] [address] [place], [place] [address]  [facility] [employer] crisis counseling, individual counseling, group therapy, in-home therapy, domestic violence services, day treatment, DWI services, Conservation officer, nature (CST), Assertive Community Treatment Team (ACTT), substance abuse Intensive Outpatient Program (several hours a day, several times a week)  2 locations - [place] and [place], [place] [address]  [phone] [address] [place], [place] [address]  [facility] counseling and group therapy  [employer] insurance, [place], [place] [phone] [address] [place], [place]  [facility]  Offers individual and family counseling  Offers LGBTQ services  Accepts private insurance and private pay [phone] [place], [place] [address]  [facility]    Offers individual counseling, group therapy, and outpatient detox  Accepts private insurance [phone] [place], [place] Medicare, private insurance [phone] [address] [place], [place] [address]   [employer]  Individual counseling  Accepts Medicare, private insurance [phone] [address] [place],  [address]  [facility] [facility] substance abuse Intensive Outpatient Program (several hours a day, several times a week) [phone], or [phone] [place], [place]

## 2015-06-28 NOTE — ED Notes (Signed)
p reports upper abdominal pain since last night. Pt reports hx of pancreatitis. Pt reports N/V/D as well.

## 2015-06-28 NOTE — ED Provider Notes (Signed)
CSN: [id number]     Arrival date & time 06/28/15  [id number] History   First MD Initiated Contact with Patient 06/28/15 1109     Chief Complaint  Patient presents with  . Abdominal Pain     (Consider location/radiation/quality/duration/timing/severity/associated sxs/prior Treatment) HPI [name] is a 45 y.o. male with a history of chronic abdominal pain secondary to alcohol use. Patient with multiple visits to the emergency department for his chronic abdominal pain. Most recently seen this morning at [facility] and discharged twice. Reports he most recently drank alcohol yesterday. No other medical symptoms or complaints.  Past Medical History  Diagnosis Date  . Hypertension   . Asthma   . Pancreatitis   . Cocaine abuse   . Depression   . H/O suicide attempt 10/2012  . Heart murmur     "when he was little" (03/06/2013)  . Anemia   . H/O hiatal hernia   . GERD (gastroesophageal reflux disease)   . Anxiety   . WPW (Wolff-Parkinson-White syndrome)     [name] 03/06/2013  . High cholesterol   . Femoral condyle fracture ([place]) 03/08/2014    left medial/notes 03/09/2014  . Alcoholism /alcohol abuse ([place])   . Family history of adverse reaction to anesthesia     "grandmother gets confused"  . Shortness of breath     "can happen at anytime" (03/06/2013)  . Pneumonia 1990's X 3  . Chronic bronchitis ([place])   . Sickle cell trait ([place])   . History of blood transfusion 10/2012    "when I tried to commit suicide"  . History of stomach ulcers   . Migraine     "a few times/year" (03/26/2015)  . Arthritis     "knees; arms; elbows" (03/26/2015)  . Chronic lower back pain   . Bipolar disorder ([place])   . PTSD (post-traumatic stress disorder)   . Marijuana abuse, continuous    Past Surgical History  Procedure Laterality Date  . Facial fracture surgery Left 1990's    "result of trauma"   . Eye surgery Left 1990's    "result of trauma"   . Left heart catheterization with  coronary angiogram Right 03/07/2013    Procedure: LEFT HEART CATHETERIZATION WITH CORONARY ANGIOGRAM;  Surgeon: [name], MD;  Location: [facility];  Service: Cardiovascular;  Laterality: Right;  . Cardiac catheterization    . Fracture surgery    . Umbilical hernia repair    . Hernia repair     Family History  Problem Relation Age of Onset  . Hypertension Other   . Coronary artery disease Other    Social History  Substance Use Topics  . Smoking status: Current Every Day Smoker -- 1.00 packs/day for 30 years    Types: Cigarettes  . Smokeless tobacco: Current User    Types: Chew  . Alcohol Use: 37.8 oz/week    63 Cans of beer per week     Comment: last drink was 06/18/15    Review of Systems A 10 point review of systems was completed and was negative except for pertinent positives and negatives as mentioned in the history of present illness     Allergies  Shellfish-derived products and Trazodone and nefazodone  Home Medications   Prior to Admission medications   Medication Sig Start Date End Date Taking? Authorizing Provider  albuterol (PROVENTIL HFA;VENTOLIN HFA) 108 (90 BASE) MCG/ACT inhaler Inhale 2 puffs into the lungs every 6 (six) hours as needed for wheezing or shortness  of breath (wheezing).     Historical Provider, MD  alum & mag hydroxide-simeth (MAALOX PLUS) 400-400-40 MG/5ML suspension Take 10 mLs by mouth every 6 (six) hours as needed for indigestion.    Historical Provider, MD  folic acid (FOLVITE) 1 MG tablet Take 1 tablet (1 mg total) by mouth daily. 01/15/15   [name], MD  HYDROcodone-acetaminophen (NORCO/VICODIN) 5-325 MG tablet Take 2 tablets by mouth every 4 (four) hours as needed. 06/26/15   [name], PA-C  lipase/protease/amylase (CREON) 12000 UNITS CPEP capsule Take 2 capsules (24,000 Units total) by mouth 3 (three) times daily with meals. 01/15/15   [name], MD  metoprolol tartrate (LOPRESSOR) 25 MG tablet Take 1 tablet (25 mg  total) by mouth 2 (two) times daily. 01/15/15   [name], MD  Multiple Vitamin (MULTIVITAMIN) capsule Take 1 capsule by mouth daily. 02/06/14   [name], MD  omeprazole (PRILOSEC) 20 MG capsule Take 1 capsule (20 mg total) by mouth daily. 04/20/15   [name], PA-C  ondansetron (ZOFRAN) 4 MG tablet Take 1 tablet (4 mg total) by mouth every 6 (six) hours. Patient not taking: Reported on 06/25/2015 06/25/15   [name], PA-C  oxyCODONE-acetaminophen (PERCOCET/ROXICET) 5-325 MG tablet Take 1 tablet by mouth every 4 (four) hours as needed for severe pain. 05/25/15   [name], MD  pantoprazole (PROTONIX) 20 MG tablet Take 1 tablet (20 mg total) by mouth daily. 06/26/15   [name], PA-C  promethazine (PHENERGAN) 25 MG tablet Take 1 tablet (25 mg total) by mouth every 6 (six) hours as needed for nausea or vomiting. 06/26/15   [name], PA-C  QUEtiapine (SEROQUEL) 50 MG tablet Take 100 mg by mouth at bedtime. 01/15/15   Historical Provider, MD  sildenafil (VIAGRA) 50 MG tablet Take 1 tablet (50 mg total) by mouth daily as needed for erectile dysfunction. 03/31/15   [name], MD  sucralfate (CARAFATE) 1 g tablet Take 1 tablet (1 g total) by mouth 4 (four) times daily -  with meals and at bedtime. 06/26/15   [name], PA-C  vitamin B-12 500 MCG tablet Take 1 tablet (500 mcg total) by mouth daily. 03/30/15   [name], MD   BP 162/99 mmHg  Pulse 83  Temp(Src) 98.1 F (36.7 C) (Oral)  Resp 16  SpO2 100% Physical Exam  Constitutional:  Awake, alert, nontoxic appearance.  HENT:  Head: Atraumatic.  Eyes: Right eye exhibits no discharge. Left eye exhibits no discharge.  Neck: Neck supple.  Pulmonary/Chest: Effort normal. He exhibits no tenderness.  Abdominal: Soft. He exhibits no distension.  Musculoskeletal: He exhibits no tenderness.  Baseline ROM, no obvious new focal weakness.  Neurological:  Mental status and motor strength appears baseline  for patient and situation.  Skin: No rash noted.  Psychiatric: He has a normal mood and affect.  Nursing note and vitals reviewed.   ED Course  Procedures (including critical care time) Labs Review Labs Reviewed - No data to display  Imaging Review No results found. I have personally reviewed and evaluated these images and lab results as part of my medical decision-making.   EKG Interpretation None     Filed Vitals:   06/28/15 1009  BP: 162/99  Pulse: 83  Temp: 98.1 F (36.7 C)  TempSrc: Oral  Resp: 16  SpO2: 100%    MDM  Patient presents to emergency Department with chronic abdominal pain, unchanged with no new medical complaints. Admits to  alcohol use yesterday. Patient has care plan in place, reviewed. Patient seen this morning twice at [facility] and came to this hospital for pain medicine. Discussed will not be administering more pain medicine. Patient encouraged to stop drinking alcohol. Given resource guide for assistance in finding alcohol detox program. Follow up with PCP as needed. Final diagnoses:  Abdominal pain, chronic, epigastric        Comer Locket, PA-C 06/28/15 1343  Charlesetta Shanks, MD 06/28/15 [phone]

## 2015-06-28 NOTE — ED Notes (Signed)
Per EMS patient from home with abdominal pain since 11pm last night.  Patient with Hx of pancreatitis.  Per ems patient took prescribed oxycodone without relief.  Patient has nausea and diarrhea.  Denies vomiting.

## 2015-06-28 NOTE — ED Notes (Signed)
Pt was seen and discharged from [facility] this morning as well.

## 2015-06-28 NOTE — Discharge Instructions (Signed)
The chronic abdominal pain will not improve if you continue to drink alcohol. He must stop drinking alcohol. Please follow-up with your doctor for reevaluation as needed. Use the attached resource guide to assist you in finding help to stop drinking alcohol.   Emergency Department Resource Guide 1) Find a Doctor and Pay Out of Pocket Although you won't have to find out who is covered by your insurance plan, it is a good idea to ask around and get recommendations. You will then need to call the office and see if the doctor you have chosen will accept you as a new patient and what types of options they offer for patients who are self-pay. Some doctors offer discounts or will set up payment plans for their patients who do not have insurance, but you will need to ask so you aren't surprised when you get to your appointment.  2) Contact Your Local Health Department Not all health departments have doctors that can see patients for sick visits, but many do, so it is worth a call to see if yours does. If you don't know where your local health department is, you can check in your phone book. The CDC also has a tool to help you locate your state's health department, and many state websites also have listings of all of their local health departments.  3) Find a [facility] If your illness is not likely to be very severe or complicated, you may want to try a walk in clinic. These are popping up all over the country in pharmacies, drugstores, and shopping centers. They're usually staffed by nurse practitioners or physician assistants that have been trained to treat common illnesses and complaints. They're usually fairly quick and inexpensive. However, if you have serious medical issues or chronic medical problems, these are probably not your best option.  No Primary Care Doctor: - Call Health Connect at  [phone] - they can help you locate a primary care doctor that  accepts your insurance, provides certain  services, etc. - Physician Referral Service- [phone]  Chronic Pain Problems: Organization         Address  Phone   Notes  [facility]  [phone] Patients need to be referred by their primary care doctor.   Medication Assistance: Organization         Address  Phone   Notes  [facility] Medication [facility] [address] [place], [place] [address] [phone] --Must be a resident of [facility] -- Must have NO insurance coverage whatsoever (no Medicaid/ Medicare, etc.) -- The pt. MUST have a primary care doctor that directs their care regularly and follows them in the community   MedAssist  [phone]   [employer]  [phone]    Agencies that provide inexpensive medical care: Organization         Address  Phone   Notes  [place]  [phone]   [facility]    [phone]   [facility] [place], [place] [address] [phone]   [place] [address], [place] [phone]   Planned Parenthood    [phone]   [facility]    [phone]   [place] and [place] [address], [place] Phone:  [phone], Fax:  [phone] Hours of Operation:  9 am - 6 pm, M-F.  Also accepts  Medicaid/Medicare and self-pay.  [name] for [place] [address], [address], [place] Phone: [phone], Fax: [phone]. Hours of Operation:  8:30 am - 5:30 pm, M-F.  Also accepts Medicaid and self-pay.  [facility] [place] [address], [place] Phone: [phone]   [place], [place], [place] [phone], Ext. [phone] Mondays & Thursdays: 7-9 AM.  First 15 patients are seen on a first come, first serve basis.    [place] Providers:  Organization         Address  Phone   Notes  [facility] [address], [place] [phone] Also accepts self-pay patients.  [facility] [id number] [place], [place]  [phone]   [place], [address], [place] [phone]   [facility] [address], [place] [phone]   Lucianne Lei [address], [place]   [phone] Only accepts [place] Access [place] patients after they have their name applied to their card.   Self-Pay (no insurance) in [facility]:  Organization         Address  Phone   Notes  Sickle Cell Patients, [facility] [place] [phone]   [facility] [place] [phone]   [facility] [place]  [place], [place], [place] [phone]   [facility]/Dr. Osei-Bonsu  [address] [place] or [address], [place] [phone] Phone number for both [place] and [place] locations is the same.  Urgent Medical and [facility] [address], [place] [phone]   [facility] [name], [place] or [address] [phone]   [facility] [address], [place] [phone], phone; [phone], fax Sees patients 1st and 3rd Saturday of every month.  Must not qualify for public or private insurance (i.e. Medicaid, Medicare, [place] Health Choice, Veterans' Benefits)  Household income should be no more than 200% of the poverty level The clinic cannot treat you if you are pregnant or think you are pregnant  Sexually transmitted diseases are not treated at the clinic.    Dental Care: Organization         Address  Phone  Notes  [facility] Department of [facility] [place] [phone] Accepts  children up to age 64 who are enrolled in [place] or [place]; pregnant women with a Medicaid card; and children who have applied for Medicaid or [place] Health Choice, but were declined, whose parents can pay a reduced fee at time of service.  [facility] Department of [facility]  [address], [place] [phone] Accepts children up to age 32 who are enrolled in [place] or [place]; pregnant women with a Medicaid card; and children who have applied for Medicaid or [place] Health Choice, but were declined, whose parents can pay a reduced fee at time of service.  [place] Adult Dental Access PROGRAM  [place] [phone] Patients are seen by appointment only. Walk-ins are not accepted. [place] will see patients 73 years of age and older. Monday - Tuesday (8am-5pm) Most  Wednesdays (8:30-5pm) $30 per visit, cash only  [facility] Adult Dental Access PROGRAM  [address], [facility] [phone] Patients are seen by appointment only. Walk-ins are not accepted. [place] will see patients 34 years of age and older. One Wednesday Evening (Monthly: Volunteer Based).  $30 per visit, cash only  [place]  [phone] for adults; Children under age 6, call Graduate Pediatric Dentistry at [phone]. Children aged 67-14, please call [phone] to request a pediatric application.  Dental services are provided in all areas of dental care including fillings, crowns and bridges, complete and partial dentures, implants, gum treatment, root canals, and extractions. Preventive care is also provided. Treatment is provided to both adults and children. Patients are selected via a lottery and there is often a waiting list.   [facility] [address] [place]  [phone] [email or web address]   [facility] [address] [place], [place] [phone], Ext. [phone] Second and  Fourth Thursday of each month, opens at 6:30 AM; Clinic ends at 9 AM.  Patients are seen on a first-come first-served basis, and a limited number are seen during each clinic.   [facility]  [address] Hillard [place]anker [place], [place] [phone]   Eligibility Requirements You must have lived in [place], [place], or [place] counties for at least the last three months.   You cannot be eligible for state or federal sponsored [employer], including [employer], [place], or [employer].   You generally cannot be eligible for healthcare insurance through your employer.    How to apply: Eligibility screenings are held every Tuesday and Wednesday afternoon from 1:00 pm until 4:00 pm. You do not need an appointment for the interview!  [facility] [address] [place], [place]   [place]  [place] Department  [place]  [phone]    Behavioral Health Resources in the Community: Intensive Outpatient Programs Organization         Address  Phone  Notes  [place]. [address] [place], [place] [phone]   [facility] Outpatient [address] [place], [place]   ADS: Alcohol & Drug Svcs [address] [place], [place]   [place] [address]  [place], [place] or [phone]   Substance Abuse Resources Organization         Address  Phone  Notes  Alcohol and Drug Services  [phone]   [place]  [phone]   The [place]   Chinita Pester  [phone]   Residential & Outpatient Substance Abuse Program  [phone]   Psychological Services Organization         Address  Phone  Notes  [facility] [place]  [place]  [phone]   [place]  [address], [place] or [phone]    Mobile Crisis Teams Organization         Address  Phone  Notes  [facility]rapeutic Alternatives, Mobile Crisis Care Unit  [phone]   [facility] Services  [address] [place], [place]   [place] Levels [address], [place] [phone]    Self-Help/Support Groups Organization         Address  Phone  Notes  Mental Health Assoc. of [place] - variety of support groups  [place] Call for more information  Narcotics Anonymous (NA), Caring Services [address], [employer] [place]  2 meetings at this location   Special educational needs teacher         Address  Phone  Notes  ASAP Residential Treatment [place],    [place]  [phone]   [facility]  [address] [place] [id number], [place], [place]   [place], [place] [phone] Admissions: 8am-3pm M-F  Incentives Substance [place] [address]    [place], [place] [phone]   [facility] [address] [place], [place], [place]   [facility] [address],  [place], [place]   [facility]ight Programs - Intensive Outpatient [address]een [address], [place], [place]   [facility] ([place] [place].,  [place], [place] [phone] or [phone]   [facility]idential [facility] Services (RTS) [address] [place], [place] Accepts Medicaid  [facility] [employer] [address],  [place] [phone] Substance Abuse/Addiction Treatment   [facility] Organization         Address  Phone  Notes  [facility]  [phone]   Domenic Schwab, PhD [address] Arlis Porta [place], [place]   [phone] or [phone]   [name], [place] [phone]   [facility] [address] [place], [place] [phone] Insurance/Medicaid/sponsorship through [facility] and Families [address], [address] [place]                                    [place], [place] [phone] [place] [address], [place] [phone]    Dr. Adele Schilder  [phone]   [facility] Dept. 1) [address] [place] 2) [place] 3)  [place] [address], [place] [phone]  [phone]   [place] [phone] or [phone] (After Hours)

## 2015-06-28 NOTE — ED Notes (Signed)
Pt just discharged from the ED. States his pain is still not controlled. LUQ, hx of pancreatitis.

## 2015-06-28 NOTE — ED Provider Notes (Signed)
[name] is an 45 y.o. male with chronic abdominal pain, discharged within the past hour by myself. Please see previous note. He is being evaluated in triage as he has returned for same complaint of abdominal pain. He is tolerating PO. He states he is here because the pain is still present. Denies new symptoms. States he has not taken his home meds yet this AM.   Physical Exam  BP 149/99 mmHg  Pulse 90  Temp(Src) 97.3 F (36.3 C) (Oral)  Resp 18  SpO2 100%  Physical Exam  Constitutional: He is oriented to person, place, and time. No distress.  Eyes: Conjunctivae are normal.  Cardiovascular: Normal rate and regular rhythm.   Pulmonary/Chest: Effort normal. No respiratory distress.  Abdominal: Soft. He exhibits no distension. There is no tenderness.  Neurological: He is alert and oriented to person, place, and time.  Skin: Skin is warm and dry. He is not diaphoretic.  Nursing note and vitals reviewed.   ED Course  Procedures   MDM Pt well known to this ED, Care Plan in place and reviewed by me. Pt just discharged within the past hour for same complaint. His exam is unremarkble and unchanged. He is tolerating PO. I strongly encouraged him to take his home meds as he has not done so this AM. Instructed to f/u with PCP. No labs or further workup indicated at this time. He is stable for discharge.       Anne Ng, PA-C 06/28/15 [id number]  [place]y Fraise, MD 06/28/15 [phone]

## 2015-06-28 NOTE — ED Notes (Signed)
Awake. Verbally responsive. A/O x4. Resp even and unlabored. No audible adventitious breath sounds noted. ABC's intact.  

## 2015-06-28 NOTE — ED Notes (Signed)
Pt seen by PA Roanna Epley, she re inforced the discharge instructions, explaining he needs to go home, rest and take his meds, drink liquids. Pt reluctant to abide but left on own accord to bus stop.

## 2015-06-28 NOTE — ED Provider Notes (Signed)
Pt seen and discharged by Delrae Rend, as he had just been seen earlier in the morning for chronic abdominal pain   [place] Fraise, MD 06/28/15 [phone]

## 2015-06-28 NOTE — ED Notes (Signed)
Bed: WA03 Expected date:  Expected time:  Means of arrival:  Comments: EMS 45yo M abd pain / pancreatitis

## 2015-07-04 ENCOUNTER — Encounter (HOSPITAL_COMMUNITY): Payer: Self-pay

## 2015-07-04 ENCOUNTER — Emergency Department (HOSPITAL_COMMUNITY)
Admission: EM | Admit: 2015-07-04 | Discharge: 2015-07-04 | Disposition: A | Discharge status: Home / Self Care | Payer: No Typology Code available for payment source | Attending: Emergency Medicine | Admitting: Emergency Medicine

## 2015-07-04 DIAGNOSIS — Z915 Personal history of self-harm: Secondary | ICD-10-CM | POA: Insufficient documentation

## 2015-07-04 DIAGNOSIS — K219 Gastro-esophageal reflux disease without esophagitis: Secondary | ICD-10-CM | POA: Insufficient documentation

## 2015-07-04 DIAGNOSIS — J45909 Unspecified asthma, uncomplicated: Secondary | ICD-10-CM | POA: Insufficient documentation

## 2015-07-04 DIAGNOSIS — I10 Essential (primary) hypertension: Secondary | ICD-10-CM | POA: Insufficient documentation

## 2015-07-04 DIAGNOSIS — K860 Alcohol-induced chronic pancreatitis: Secondary | ICD-10-CM | POA: Insufficient documentation

## 2015-07-04 DIAGNOSIS — M1990 Unspecified osteoarthritis, unspecified site: Secondary | ICD-10-CM | POA: Insufficient documentation

## 2015-07-04 DIAGNOSIS — Z79899 Other long term (current) drug therapy: Secondary | ICD-10-CM | POA: Insufficient documentation

## 2015-07-04 DIAGNOSIS — Z8781 Personal history of (healed) traumatic fracture: Secondary | ICD-10-CM | POA: Insufficient documentation

## 2015-07-04 DIAGNOSIS — F319 Bipolar disorder, unspecified: Secondary | ICD-10-CM | POA: Insufficient documentation

## 2015-07-04 DIAGNOSIS — R011 Cardiac murmur, unspecified: Secondary | ICD-10-CM | POA: Insufficient documentation

## 2015-07-04 DIAGNOSIS — Z8639 Personal history of other endocrine, nutritional and metabolic disease: Secondary | ICD-10-CM | POA: Insufficient documentation

## 2015-07-04 DIAGNOSIS — F4310 Post-traumatic stress disorder, unspecified: Secondary | ICD-10-CM | POA: Insufficient documentation

## 2015-07-04 DIAGNOSIS — G8929 Other chronic pain: Secondary | ICD-10-CM | POA: Insufficient documentation

## 2015-07-04 DIAGNOSIS — F1010 Alcohol abuse, uncomplicated: Secondary | ICD-10-CM | POA: Insufficient documentation

## 2015-07-04 DIAGNOSIS — D649 Anemia, unspecified: Secondary | ICD-10-CM | POA: Insufficient documentation

## 2015-07-04 DIAGNOSIS — Z8701 Personal history of pneumonia (recurrent): Secondary | ICD-10-CM | POA: Insufficient documentation

## 2015-07-04 DIAGNOSIS — Z9889 Other specified postprocedural states: Secondary | ICD-10-CM | POA: Insufficient documentation

## 2015-07-04 DIAGNOSIS — F17210 Nicotine dependence, cigarettes, uncomplicated: Secondary | ICD-10-CM | POA: Insufficient documentation

## 2015-07-04 LAB — COMPREHENSIVE METABOLIC PANEL
ALT: 24 U/L (ref 17–63)
AST: 35 U/L (ref 15–41)
Albumin: 4.1 g/dL (ref 3.5–5.0)
Alkaline Phosphatase: 104 U/L (ref 38–126)
Anion gap: 16 — ABNORMAL HIGH (ref 5–15)
BUN: 9 mg/dL (ref 6–20)
CHLORIDE: 99 mmol/L — AB (ref 101–111)
CO2: 22 mmol/L (ref 22–32)
Calcium: 9.4 mg/dL (ref 8.9–10.3)
Creatinine, Ser: 0.78 mg/dL (ref 0.61–1.24)
Glucose, Bld: 83 mg/dL (ref 65–99)
POTASSIUM: 3.9 mmol/L (ref 3.5–5.1)
Sodium: 137 mmol/L (ref 135–145)
Total Bilirubin: 0.6 mg/dL (ref 0.3–1.2)
Total Protein: 8 g/dL (ref 6.5–8.1)

## 2015-07-04 LAB — CBC
HEMATOCRIT: 39.8 % (ref 39.0–52.0)
Hemoglobin: 13.1 g/dL (ref 13.0–17.0)
MCH: 30.2 pg (ref 26.0–34.0)
MCHC: 32.9 g/dL (ref 30.0–36.0)
MCV: 91.7 fL (ref 78.0–100.0)
Platelets: 226 10*3/uL (ref 150–400)
RBC: 4.34 MIL/uL (ref 4.22–5.81)
RDW: 12.9 % (ref 11.5–15.5)
WBC: 8.8 10*3/uL (ref 4.0–10.5)

## 2015-07-04 LAB — URINALYSIS, ROUTINE W REFLEX MICROSCOPIC
Bilirubin Urine: NEGATIVE
GLUCOSE, UA: NEGATIVE mg/dL
Hgb urine dipstick: NEGATIVE
Ketones, ur: NEGATIVE mg/dL
LEUKOCYTES UA: NEGATIVE
Nitrite: NEGATIVE
Protein, ur: NEGATIVE mg/dL
Specific Gravity, Urine: 1.012 (ref 1.005–1.030)
pH: 6 (ref 5.0–8.0)

## 2015-07-04 LAB — LIPASE, BLOOD: LIPASE: 77 U/L — AB (ref 11–51)

## 2015-07-04 MED ORDER — GI COCKTAIL ~~LOC~~
30.0000 mL | Freq: Once | ORAL | Status: AC
  Administered 2015-07-04: 30 mL via ORAL
  Filled 2015-07-04: qty 30

## 2015-07-04 MED ORDER — PROMETHAZINE HCL 25 MG PO TABS
25.0000 mg | ORAL_TABLET | Freq: Once | ORAL | Status: AC
  Administered 2015-07-04: 25 mg via ORAL
  Filled 2015-07-04: qty 1

## 2015-07-04 MED ORDER — ONDANSETRON HCL 4 MG/2ML IJ SOLN
4.0000 mg | Freq: Once | INTRAMUSCULAR | Status: AC
  Administered 2015-07-04: 4 mg via INTRAVENOUS
  Filled 2015-07-04: qty 2

## 2015-07-04 MED ORDER — SODIUM CHLORIDE 0.9 % IV BOLUS (SEPSIS)
1000.0000 mL | Freq: Once | INTRAVENOUS | Status: AC
  Administered 2015-07-04: 1000 mL via INTRAVENOUS

## 2015-07-04 MED ORDER — KETOROLAC TROMETHAMINE 30 MG/ML IJ SOLN
30.0000 mg | Freq: Once | INTRAMUSCULAR | Status: AC
  Administered 2015-07-04: 30 mg via INTRAVENOUS
  Filled 2015-07-04: qty 1

## 2015-07-04 NOTE — ED Provider Notes (Signed)
CSN: [id number]     Arrival date & time 07/04/15  1820 History   First MD Initiated Contact with Patient 07/04/15 2008     Chief Complaint  Patient presents with  . Abdominal Pain     (Consider location/radiation/quality/duration/timing/severity/associated sxs/prior Treatment) HPI  PCP: [name], NP  [name] is a 45 y.o.  male  Patient to the ER with complaints of severe pancreatitis pain. He reports drinking alcohol and also ate fried chicken.  He says he knows he shouldn't eat that or drink alcohol.[name] He tried to treat his pain with Creon, Oxycodone, Carafate and did not have any relief of pain. He had chinese food as well at 3:30-4 pm. He has also had vomiting x 1 and 3 episodes of diarrhea.   ROS: The patient denies diaphoresis, fever, headache, weakness (general or focal), confusion, change of vision,  dysphagia, aphagia, shortness of breath,  , lower extremity swelling, rash, neck pain, chest pain   Past Medical History  Diagnosis Date  . Hypertension   . Asthma   . Pancreatitis   . Cocaine abuse   . Depression   . H/O suicide attempt 10/2012  . Heart murmur     "when he was little" (03/06/2013)  . Anemia   . H/O hiatal hernia   . GERD (gastroesophageal reflux disease)   . Anxiety   . WPW (Wolff-Parkinson-White syndrome)     [name] 03/06/2013  . High cholesterol   . Femoral condyle fracture ([place]) 03/08/2014    left medial/notes 03/09/2014  . Alcoholism /alcohol abuse ([place])   . Family history of adverse reaction to anesthesia     "grandmother gets confused"  . Shortness of breath     "can happen at anytime" (03/06/2013)  . Pneumonia 1990's X 3  . Chronic bronchitis ([place])   . Sickle cell trait ([place])   . History of blood transfusion 10/2012    "when I tried to commit suicide"  . History of stomach ulcers   . Migraine     "a few times/year" (03/26/2015)  . Arthritis     "knees; arms; elbows" (03/26/2015)  . Chronic lower back pain   . Bipolar  disorder ([place])   . PTSD (post-traumatic stress disorder)   . Marijuana abuse, continuous    Past Surgical History  Procedure Laterality Date  . Facial fracture surgery Left 1990's    "result of trauma"   . Eye surgery Left 1990's    "result of trauma"   . Left heart catheterization with coronary angiogram Right 03/07/2013    Procedure: LEFT HEART CATHETERIZATION WITH CORONARY ANGIOGRAM;  Surgeon: [name], MD;  Location: [facility];  Service: Cardiovascular;  Laterality: Right;  . Cardiac catheterization    . Fracture surgery    . Umbilical hernia repair    . Hernia repair     Family History  Problem Relation Age of Onset  . Hypertension Other   . Coronary artery disease Other    Social History  Substance Use Topics  . Smoking status: Current Every Day Smoker -- 1.00 packs/day for 30 years    Types: Cigarettes  . Smokeless tobacco: Current User    Types: Chew  . Alcohol Use: 37.8 oz/week    63 Cans of beer per week     Comment: last drink was 06/18/15    Review of Systems  Review of Systems All other systems negative except as documented in the HPI. All pertinent positives and negatives as  reviewed in the HPI.   Allergies  Shellfish-derived products and Trazodone and nefazodone  Home Medications   Prior to Admission medications   Medication Sig Start Date End Date Taking? Authorizing Provider  albuterol (PROVENTIL HFA;VENTOLIN HFA) 108 (90 BASE) MCG/ACT inhaler Inhale 2 puffs into the lungs every 6 (six) hours as needed for wheezing or shortness of breath (wheezing).     Historical Provider, MD  alum & mag hydroxide-simeth (MAALOX PLUS) 400-400-40 MG/5ML suspension Take 10 mLs by mouth every 6 (six) hours as needed for indigestion.    Historical Provider, MD  folic acid (FOLVITE) 1 MG tablet Take 1 tablet (1 mg total) by mouth daily. 01/15/15   [name], MD  HYDROcodone-acetaminophen (NORCO/VICODIN) 5-325 MG tablet Take 2 tablets by mouth every 4 (four)  hours as needed. 06/26/15   [name], PA-C  lipase/protease/amylase (CREON) 12000 UNITS CPEP capsule Take 2 capsules (24,000 Units total) by mouth 3 (three) times daily with meals. 01/15/15   [name], MD  metoprolol tartrate (LOPRESSOR) 25 MG tablet Take 1 tablet (25 mg total) by mouth 2 (two) times daily. 01/15/15   [name], MD  Multiple Vitamin (MULTIVITAMIN) capsule Take 1 capsule by mouth daily. 02/06/14   [name], MD  omeprazole (PRILOSEC) 20 MG capsule Take 1 capsule (20 mg total) by mouth daily. 04/20/15   [name], PA-C  ondansetron (ZOFRAN) 4 MG tablet Take 1 tablet (4 mg total) by mouth every 6 (six) hours. Patient not taking: Reported on 06/25/2015 06/25/15   [name], PA-C  oxyCODONE-acetaminophen (PERCOCET/ROXICET) 5-325 MG tablet Take 1 tablet by mouth every 4 (four) hours as needed for severe pain. 05/25/15   [name], MD  pantoprazole (PROTONIX) 20 MG tablet Take 1 tablet (20 mg total) by mouth daily. 06/26/15   [name], PA-C  promethazine (PHENERGAN) 25 MG tablet Take 1 tablet (25 mg total) by mouth every 6 (six) hours as needed for nausea or vomiting. 06/26/15   [name], PA-C  QUEtiapine (SEROQUEL) 50 MG tablet Take 100 mg by mouth at bedtime. 01/15/15   Historical Provider, MD  sildenafil (VIAGRA) 50 MG tablet Take 1 tablet (50 mg total) by mouth daily as needed for erectile dysfunction. 03/31/15   [name], MD  sucralfate (CARAFATE) 1 g tablet Take 1 tablet (1 g total) by mouth 4 (four) times daily -  with meals and at bedtime. 06/26/15   [name], PA-C  vitamin B-12 500 MCG tablet Take 1 tablet (500 mcg total) by mouth daily. 03/30/15   [name], MD   BP 162/105 mmHg  Pulse 92  Temp(Src) 98.2 F (36.8 C) (Oral)  Resp 20  SpO2 100% Physical Exam  Constitutional: He appears well-developed and well-nourished. He appears distressed (due to pain).  HENT:  Head: Normocephalic and atraumatic.  Nose: Nose  normal.  Mouth/Throat: Uvula is midline, oropharynx is clear and moist and mucous membranes are normal.  Eyes: Pupils are equal, round, and reactive to light.  Neck: Normal range of motion. Neck supple.  Cardiovascular: Normal rate and regular rhythm.   Pulmonary/Chest: Effort normal.  Abdominal: Soft. Bowel sounds are normal. There is tenderness in the epigastric area. There is guarding. There is no rigidity (voluntary), no rebound and no CVA tenderness.  No signs of abdominal distention  Musculoskeletal:  No LE swelling  Neurological: He is alert.  Acting at baseline  Skin: Skin is warm and dry. No rash noted.  Nursing note  and vitals reviewed.   ED Course  Procedures (including critical care time) Labs Review Labs Reviewed  LIPASE, BLOOD - Abnormal; Notable for the following:    Lipase 77 (*)    All other components within normal limits  COMPREHENSIVE METABOLIC PANEL - Abnormal; Notable for the following:    Chloride 99 (*)    Anion gap 16 (*)    All other components within normal limits  CBC  URINALYSIS, ROUTINE W REFLEX MICROSCOPIC (NOT AT [facility])    Imaging Review No results found. I have personally reviewed and evaluated these images and lab results as part of my medical decision-making.   EKG Interpretation None      MDM   Final diagnoses:  Alcohol-induced chronic pancreatitis [facility])   Patient has a care plan that was reviewed by me.  He admits to exacerbation of his abdominal pain- chronic pancreatitis.  His labs were ordered in triage as well as IV saline lock obtained.  have been reviewed and he does have an elevated lipase at 77.  CBC and Urinalysis are unremarkable. CMP shows a small gap but otherwise no abnormalities. Will hydrate with a liter of fluids, given Toradol, Zofran and GI cocktail.  He understands that he will be discharged to use his home medication. Encouraged to stop drinking alcohol. And will be given outpatient resources for  detox.      Delos Haring, PA-C 07/04/15 2038  Orlie Dakin, MD 07/04/15 2322

## 2015-07-04 NOTE — ED Notes (Signed)
Pt arrives to triage via [facility] for complaint of abdominal pain associated to N/V/D due to pancreatitis flare up. He states ate [place] food earlier.

## 2015-07-04 NOTE — Discharge Instructions (Signed)
Alcohol Use Disorder  Alcohol use disorder is a mental disorder. It is not a one-time incident of heavy drinking. Alcohol use disorder is the excessive and uncontrollable use of alcohol over time that leads to problems with functioning in one or more areas of daily living. People with this disorder risk harming themselves and others when they drink to excess. Alcohol use disorder also can cause other mental disorders, such as mood and anxiety disorders, and serious physical problems. People with alcohol use disorder often misuse other drugs.   Alcohol use disorder is common and widespread. Some people with this disorder drink alcohol to cope with or escape from negative life events. Others drink to relieve chronic pain or symptoms of mental illness. People with a family history of alcohol use disorder are at higher risk of losing control and using alcohol to excess.   Drinking too much alcohol can cause injury, accidents, and health problems. One drink can be too much when you are:  · Working.  · Pregnant or breastfeeding.  · Taking medicines. Ask your doctor.  · Driving or planning to drive.  SYMPTOMS   Signs and symptoms of alcohol use disorder may include the following:   · Consumption of alcohol in larger amounts or over a longer period of time than intended.  · Multiple unsuccessful attempts to cut down or control alcohol use.    · A great deal of time spent obtaining alcohol, using alcohol, or recovering from the effects of alcohol (hangover).  · A strong desire or urge to use alcohol (cravings).    · Continued use of alcohol despite problems at work, school, or home because of alcohol use.    · Continued use of alcohol despite problems in relationships because of alcohol use.  · Continued use of alcohol in situations when it is physically hazardous, such as driving a car.  · Continued use of alcohol despite awareness of a physical or psychological problem that is likely related to alcohol use. Physical  problems related to alcohol use can involve the brain, heart, liver, stomach, and intestines. Psychological problems related to alcohol use include intoxication, depression, anxiety, psychosis, delirium, and dementia.    · The need for increased amounts of alcohol to achieve the same desired effect, or a decreased effect from the consumption of the same amount of alcohol (tolerance).  · Withdrawal symptoms upon reducing or stopping alcohol use, or alcohol use to reduce or avoid withdrawal symptoms. Withdrawal symptoms include:  ¨ Racing heart.  ¨ Hand tremor.  ¨ Difficulty sleeping.  ¨ Nausea.  ¨ Vomiting.  ¨ Hallucinations.  ¨ Restlessness.  ¨ Seizures.  DIAGNOSIS  Alcohol use disorder is diagnosed through an assessment by your health care provider. Your health care provider may start by asking three or four questions to screen for excessive or problematic alcohol use. To confirm a diagnosis of alcohol use disorder, at least two symptoms must be present within a 12-month period. The severity of alcohol use disorder depends on the number of symptoms:  · Mild--two or three.  · Moderate--four or five.  · Severe--six or more.  Your health care provider may perform a physical exam or use results from lab tests to see if you have physical problems resulting from alcohol use. Your health care provider may refer you to a mental health professional for evaluation.  TREATMENT   Some people with alcohol use disorder are able to reduce their alcohol use to low-risk levels. Some people with alcohol use disorder need to quit drinking alcohol. When   necessary, mental health professionals with specialized training in substance use treatment can help. Your health care provider can help you decide how severe your alcohol use disorder is and what type of treatment you need. The following forms of treatment are available:   · Detoxification. Detoxification involves the use of prescription medicines to prevent alcohol withdrawal  symptoms in the first week after quitting. This is important for people with a history of symptoms of withdrawal and for heavy drinkers who are likely to have withdrawal symptoms. Alcohol withdrawal can be dangerous and, in severe cases, cause death. Detoxification is usually provided in a hospital or in-patient substance use treatment facility.  · Counseling or talk therapy. Talk therapy is provided by substance use treatment counselors. It addresses the reasons people use alcohol and ways to keep them from drinking again. The goals of talk therapy are to help people with alcohol use disorder find healthy activities and ways to cope with life stress, to identify and avoid triggers for alcohol use, and to handle cravings, which can cause relapse.  · Medicines. Different medicines can help treat alcohol use disorder through the following actions:    Decrease alcohol cravings.    Decrease the positive reward response felt from alcohol use.    Produce an uncomfortable physical reaction when alcohol is used (aversion therapy).  · Support groups. Support groups are run by people who have quit drinking. They provide emotional support, advice, and guidance.  These forms of treatment are often combined. Some people with alcohol use disorder benefit from intensive combination treatment provided by specialized substance use treatment centers. Both inpatient and outpatient treatment programs are available.     This information is not intended to replace advice given to you by your health care provider. Make sure you discuss any questions you have with your health care provider.     Document Released: 06/02/2004 Document Revised: 05/16/2014 Document Reviewed: 08/02/2012  Elsevier Interactive Patient Education ©2016 [employer]

## 2015-07-05 ENCOUNTER — Emergency Department (HOSPITAL_COMMUNITY)
Admission: EM | Admit: 2015-07-05 | Discharge: 2015-07-05 | Disposition: A | Discharge status: Home / Self Care | Payer: No Typology Code available for payment source | Attending: Emergency Medicine | Admitting: Emergency Medicine

## 2015-07-05 ENCOUNTER — Encounter (HOSPITAL_COMMUNITY): Payer: Self-pay

## 2015-07-05 DIAGNOSIS — Z8639 Personal history of other endocrine, nutritional and metabolic disease: Secondary | ICD-10-CM | POA: Insufficient documentation

## 2015-07-05 DIAGNOSIS — K860 Alcohol-induced chronic pancreatitis: Secondary | ICD-10-CM | POA: Insufficient documentation

## 2015-07-05 DIAGNOSIS — K858 Other acute pancreatitis without necrosis or infection: Secondary | ICD-10-CM

## 2015-07-05 DIAGNOSIS — Z8701 Personal history of pneumonia (recurrent): Secondary | ICD-10-CM | POA: Insufficient documentation

## 2015-07-05 DIAGNOSIS — K219 Gastro-esophageal reflux disease without esophagitis: Secondary | ICD-10-CM | POA: Insufficient documentation

## 2015-07-05 DIAGNOSIS — J45909 Unspecified asthma, uncomplicated: Secondary | ICD-10-CM | POA: Insufficient documentation

## 2015-07-05 DIAGNOSIS — I10 Essential (primary) hypertension: Secondary | ICD-10-CM | POA: Insufficient documentation

## 2015-07-05 DIAGNOSIS — R000 Tachycardia, unspecified: Secondary | ICD-10-CM | POA: Insufficient documentation

## 2015-07-05 DIAGNOSIS — Z9889 Other specified postprocedural states: Secondary | ICD-10-CM | POA: Insufficient documentation

## 2015-07-05 DIAGNOSIS — Z79899 Other long term (current) drug therapy: Secondary | ICD-10-CM | POA: Insufficient documentation

## 2015-07-05 DIAGNOSIS — G8929 Other chronic pain: Secondary | ICD-10-CM | POA: Insufficient documentation

## 2015-07-05 DIAGNOSIS — R011 Cardiac murmur, unspecified: Secondary | ICD-10-CM | POA: Insufficient documentation

## 2015-07-05 DIAGNOSIS — R109 Unspecified abdominal pain: Secondary | ICD-10-CM

## 2015-07-05 DIAGNOSIS — F419 Anxiety disorder, unspecified: Secondary | ICD-10-CM | POA: Insufficient documentation

## 2015-07-05 DIAGNOSIS — Z862 Personal history of diseases of the blood and blood-forming organs and certain disorders involving the immune mechanism: Secondary | ICD-10-CM | POA: Insufficient documentation

## 2015-07-05 DIAGNOSIS — M1990 Unspecified osteoarthritis, unspecified site: Secondary | ICD-10-CM | POA: Insufficient documentation

## 2015-07-05 DIAGNOSIS — F17210 Nicotine dependence, cigarettes, uncomplicated: Secondary | ICD-10-CM | POA: Insufficient documentation

## 2015-07-05 DIAGNOSIS — Z8781 Personal history of (healed) traumatic fracture: Secondary | ICD-10-CM | POA: Insufficient documentation

## 2015-07-05 DIAGNOSIS — F319 Bipolar disorder, unspecified: Secondary | ICD-10-CM | POA: Insufficient documentation

## 2015-07-05 DIAGNOSIS — G43909 Migraine, unspecified, not intractable, without status migrainosus: Secondary | ICD-10-CM | POA: Insufficient documentation

## 2015-07-05 LAB — COMPREHENSIVE METABOLIC PANEL
ALT: 19 U/L (ref 17–63)
ANION GAP: 13 (ref 5–15)
AST: 45 U/L — AB (ref 15–41)
Albumin: 3.7 g/dL (ref 3.5–5.0)
Alkaline Phosphatase: 95 U/L (ref 38–126)
BILIRUBIN TOTAL: 1.6 mg/dL — AB (ref 0.3–1.2)
BUN: 7 mg/dL (ref 6–20)
CO2: 21 mmol/L — ABNORMAL LOW (ref 22–32)
Calcium: 8.9 mg/dL (ref 8.9–10.3)
Chloride: 103 mmol/L (ref 101–111)
Creatinine, Ser: 0.7 mg/dL (ref 0.61–1.24)
Glucose, Bld: 99 mg/dL (ref 65–99)
POTASSIUM: 4.2 mmol/L (ref 3.5–5.1)
Sodium: 137 mmol/L (ref 135–145)
TOTAL PROTEIN: 7.2 g/dL (ref 6.5–8.1)

## 2015-07-05 LAB — LIPASE, BLOOD: LIPASE: 50 U/L (ref 11–51)

## 2015-07-05 LAB — CBC
HCT: 36.9 % — ABNORMAL LOW (ref 39.0–52.0)
Hemoglobin: 12.6 g/dL — ABNORMAL LOW (ref 13.0–17.0)
MCH: 30.6 pg (ref 26.0–34.0)
MCHC: 34.1 g/dL (ref 30.0–36.0)
MCV: 89.6 fL (ref 78.0–100.0)
PLATELETS: 215 10*3/uL (ref 150–400)
RBC: 4.12 MIL/uL — AB (ref 4.22–5.81)
RDW: 12.7 % (ref 11.5–15.5)
WBC: 9.6 10*3/uL (ref 4.0–10.5)

## 2015-07-05 MED ORDER — ONDANSETRON HCL 4 MG/2ML IJ SOLN
4.0000 mg | Freq: Once | INTRAMUSCULAR | Status: AC
  Administered 2015-07-05: 4 mg via INTRAVENOUS
  Filled 2015-07-05: qty 2

## 2015-07-05 MED ORDER — KETOROLAC TROMETHAMINE 30 MG/ML IJ SOLN
30.0000 mg | Freq: Once | INTRAMUSCULAR | Status: AC
  Administered 2015-07-05: 30 mg via INTRAVENOUS
  Filled 2015-07-05: qty 1

## 2015-07-05 MED ORDER — GI COCKTAIL ~~LOC~~
30.0000 mL | Freq: Once | ORAL | Status: AC
  Administered 2015-07-05: 30 mL via ORAL
  Filled 2015-07-05: qty 30

## 2015-07-05 MED ORDER — SODIUM CHLORIDE 0.9 % IV BOLUS (SEPSIS)
1000.0000 mL | Freq: Once | INTRAVENOUS | Status: AC
  Administered 2015-07-05: 1000 mL via INTRAVENOUS

## 2015-07-05 MED ORDER — SUCRALFATE 1 G PO TABS
1.0000 g | ORAL_TABLET | Freq: Once | ORAL | Status: AC
  Administered 2015-07-05: 1 g via ORAL
  Filled 2015-07-05: qty 1

## 2015-07-05 NOTE — Discharge Instructions (Signed)
1. Medications: usual home medications 2. Treatment: rest, drink plenty of fluids,  3. Follow Up: Please followup with your primary doctor in 1 day for discussion of your diagnoses and further evaluation after today's visit; if you do not have a primary care doctor use the resource guide provided to find one; Please return to the ER for worsening symptoms, fevers, intractable vomiting

## 2015-07-05 NOTE — ED Provider Notes (Signed)
CSN: [id number]     Arrival date & time 07/05/15  2016 History   First MD Initiated Contact with Patient 07/05/15 2017     Chief Complaint  Patient presents with  . Abdominal Pain     (Consider location/radiation/quality/duration/timing/severity/associated sxs/prior Treatment) The history is provided by the patient and medical records. No language interpreter was used.   [name] is a 45 y.o. male  with a hx of HTN, asthma, pancreatitis, cocaine abuse, anemia, GERD, anxiety, WPW, alcohol abuse presents to the Emergency Department complaining of gradual, persistent, progressively worsening epigastric abd pain onset 11am this morning after eating mashed potatoes and baked chicken.  Pt was seen last night for the same.  At that he admitted to EtOH and fried chicken prior to his exacerbation.  Pt reports he has taken omeprazole, Carafate, Creon and Tylenol #3 (as he is out of his percocet) without relief.  He reports 1 episode of NBNB emesis. He also reports persistent diarrhea since yesterday.  He reports his pain is epigastric, sharp and rated at an 8/10.  He reports this is similar to previous episodes.    Past Medical History  Diagnosis Date  . Hypertension   . Asthma   . Pancreatitis   . Cocaine abuse   . Depression   . H/O suicide attempt 10/2012  . Heart murmur     "when he was little" (03/06/2013)  . Anemia   . H/O hiatal hernia   . GERD (gastroesophageal reflux disease)   . Anxiety   . WPW (Wolff-Parkinson-White syndrome)     [name] 03/06/2013  . High cholesterol   . Femoral condyle fracture ([place]) 03/08/2014    left medial/notes 03/09/2014  . Alcoholism /alcohol abuse ([place])   . Family history of adverse reaction to anesthesia     "grandmother gets confused"  . Shortness of breath     "can happen at anytime" (03/06/2013)  . Pneumonia 1990's X 3  . Chronic bronchitis ([place])   . Sickle cell trait ([place])   . History of blood transfusion 10/2012    "when I tried to commit  suicide"  . History of stomach ulcers   . Migraine     "a few times/year" (03/26/2015)  . Arthritis     "knees; arms; elbows" (03/26/2015)  . Chronic lower back pain   . Bipolar disorder ([place])   . PTSD (post-traumatic stress disorder)   . Marijuana abuse, continuous    Past Surgical History  Procedure Laterality Date  . Facial fracture surgery Left 1990's    "result of trauma"   . Eye surgery Left 1990's    "result of trauma"   . Left heart catheterization with coronary angiogram Right 03/07/2013    Procedure: LEFT HEART CATHETERIZATION WITH CORONARY ANGIOGRAM;  Surgeon: [name], MD;  Location: [facility];  Service: Cardiovascular;  Laterality: Right;  . Cardiac catheterization    . Fracture surgery    . Umbilical hernia repair    . Hernia repair     Family History  Problem Relation Age of Onset  . Hypertension Other   . Coronary artery disease Other    Social History  Substance Use Topics  . Smoking status: Current Every Day Smoker -- 1.00 packs/day for 30 years    Types: Cigarettes  . Smokeless tobacco: Current User    Types: Chew  . Alcohol Use: 37.8 oz/week    63 Cans of beer per week     Comment: last drink  was 06/18/15    Review of Systems  Constitutional: Negative for fever, diaphoresis, appetite change, fatigue and unexpected weight change.  HENT: Negative for mouth sores.   Eyes: Negative for visual disturbance.  Respiratory: Negative for cough, chest tightness, shortness of breath and wheezing.   Cardiovascular: Negative for chest pain.  Gastrointestinal: Positive for nausea, vomiting, abdominal pain and diarrhea. Negative for constipation.  Endocrine: Negative for polydipsia, polyphagia and polyuria.  Genitourinary: Negative for dysuria, urgency, frequency and hematuria.  Musculoskeletal: Negative for back pain and neck stiffness.  Skin: Negative for rash.  Allergic/Immunologic: Negative for immunocompromised state.  Neurological: Negative for  syncope, light-headedness and headaches.  Hematological: Does not bruise/bleed easily.  Psychiatric/Behavioral: Negative for sleep disturbance. The patient is not nervous/anxious.       Allergies  Shellfish-derived products and Trazodone and nefazodone  Home Medications   Prior to Admission medications   Medication Sig Start Date End Date Taking? Authorizing Provider  acetaminophen-codeine (TYLENOL #3) 300-30 MG tablet Take 1 tablet by mouth every 4 (four) hours as needed for moderate pain.   Yes Historical Provider, MD  albuterol (PROVENTIL HFA;VENTOLIN HFA) 108 (90 BASE) MCG/ACT inhaler Inhale 2 puffs into the lungs every 6 (six) hours as needed for wheezing or shortness of breath (wheezing).    Yes Historical Provider, MD  alum & mag hydroxide-simeth (MAALOX PLUS) 400-400-40 MG/5ML suspension Take 10 mLs by mouth every 6 (six) hours as needed for indigestion.   Yes Historical Provider, MD  fluticasone (FLONASE) 50 MCG/ACT nasal spray Place 1 spray into both nostrils daily as needed for allergies or rhinitis.   Yes Historical Provider, MD  folic acid (FOLVITE) 1 MG tablet Take 1 tablet (1 mg total) by mouth daily. 01/15/15  Yes [name], MD  lipase/protease/amylase (CREON) 12000 UNITS CPEP capsule Take 2 capsules (24,000 Units total) by mouth 3 (three) times daily with meals. 01/15/15  Yes [name], MD  metoprolol tartrate (LOPRESSOR) 25 MG tablet Take 1 tablet (25 mg total) by mouth 2 (two) times daily. 01/15/15  Yes [name], MD  Multiple Vitamin (MULTIVITAMIN) capsule Take 1 capsule by mouth daily. 02/06/14  Yes [name], MD  omeprazole (PRILOSEC) 20 MG capsule Take 1 capsule (20 mg total) by mouth daily. 04/20/15  Yes [name], PA-C  oxyCODONE-acetaminophen (PERCOCET/ROXICET) 5-325 MG tablet Take 1 tablet by mouth every 4 (four) hours as needed for severe pain. 05/25/15  Yes [name], MD  promethazine (PHENERGAN) 25 MG tablet Take 1  tablet (25 mg total) by mouth every 6 (six) hours as needed for nausea or vomiting. 06/26/15  Yes [name], PA-C  QUEtiapine (SEROQUEL) 50 MG tablet Take 100 mg by mouth at bedtime. 01/15/15  Yes Historical Provider, MD  sertraline (ZOLOFT) 25 MG tablet Take 75 mg by mouth daily.   Yes Historical Provider, MD  sildenafil (VIAGRA) 50 MG tablet Take 1 tablet (50 mg total) by mouth daily as needed for erectile dysfunction. 03/31/15  Yes Olugbemiga Essie Christine, MD  sucralfate (CARAFATE) 1 g tablet Take 1 tablet (1 g total) by mouth 4 (four) times daily -  with meals and at bedtime. 06/26/15  Yes [name], PA-C  vitamin B-12 500 MCG tablet Take 1 tablet (500 mcg total) by mouth daily. 03/30/15  Yes [name], MD  HYDROcodone-acetaminophen (NORCO/VICODIN) 5-325 MG tablet Take 2 tablets by mouth every 4 (four) hours as needed. Patient taking differently: Take 2 tablets by mouth every 4 (four) hours  as needed for moderate pain.  06/26/15   [name], PA-C  ondansetron (ZOFRAN) 4 MG tablet Take 1 tablet (4 mg total) by mouth every 6 (six) hours. 06/25/15   Stevi Barrett, PA-C  pantoprazole (PROTONIX) 20 MG tablet Take 1 tablet (20 mg total) by mouth daily. Patient not taking: Reported on 07/04/2015 06/26/15   [name], PA-C   BP 169/106 mmHg  Pulse 81  Temp(Src) 98.3 F (36.8 C) (Oral)  Resp 12  SpO2 99% Physical Exam  Constitutional: He appears well-developed and well-nourished. No distress.  Awake, alert, nontoxic appearance  HENT:  Head: Normocephalic and atraumatic.  Mouth/Throat: Oropharynx is clear and moist. No oropharyngeal exudate.  Eyes: Conjunctivae are normal. No scleral icterus.  Neck: Normal range of motion. Neck supple.  Cardiovascular: Regular rhythm and intact distal pulses.  Tachycardia present.   Pulses:      Radial pulses are 2+ on the right side, and 2+ on the left side.  Pulmonary/Chest: Effort normal and breath sounds normal. No respiratory distress. He has no wheezes.   Equal chest expansion  Abdominal: Soft. Bowel sounds are normal. He exhibits no mass. There is tenderness in the epigastric area. There is no rebound, no guarding and no CVA tenderness.  Mild epigastric tenderness without rebound or guarding  Musculoskeletal: Normal range of motion. He exhibits no edema.  Neurological: He is alert.  Speech is clear and goal oriented Moves extremities without ataxia  Skin: Skin is warm and dry. He is not diaphoretic.  Psychiatric: He has a normal mood and affect.  Nursing note and vitals reviewed.   ED Course  Procedures (including critical care time) Labs Review Labs Reviewed  CBC - Abnormal; Notable for the following:    RBC 4.12 (*)    Hemoglobin 12.6 (*)    HCT 36.9 (*)    All other components within normal limits  COMPREHENSIVE METABOLIC PANEL - Abnormal; Notable for the following:    CO2 21 (*)    AST 45 (*)    Total Bilirubin 1.6 (*)    All other components within normal limits  LIPASE, BLOOD     MDM   Final diagnoses:  Chronic abdominal pain  Other acute pancreatitis  Alcohol-induced chronic pancreatitis ([place])   [name] presents with persistent acute exacerbation of his chronic pancreatitis. Patient was seen last night for the same. Labs today are improved.  He can given Toradol, Zofran, Carafate and GI cocktail.  Pt with some improvement in pain.  He has tolerated by mouth crackers and fluids without emesis.  Initial and repeat exams his abdomen is somewhat tender in epigastrium but is without rebound or guarding.  Patient does not have a surgical abdomen.  Patient noted to be hypertensive in the emergency department.  No signs of hypertensive urgency.  Discussed with patient the need for close follow-up and management by their primary care physician.  His initial tachycardia resolved with fluids.    Discussed findings with patient.  His labs today are improved. His lipase is normalizing. He will be discharged home to take  his home medication. Encourage patient to stop drinking and monitor by mouth intake carefully, advancing diet slowly.    Jarrett Soho Jari Dipasquale, PA-C 07/05/15 [id number]  Orlie Dakin, MD 07/05/15 [id number]

## 2015-07-05 NOTE — ED Notes (Signed)
Pt with history of pancreatitis, complaining of abdominal pain. Pt was seen for the same yesterday and discharged.

## 2015-07-05 NOTE — ED Notes (Signed)
Pt given crackers and water. 

## 2015-08-24 ENCOUNTER — Emergency Department (HOSPITAL_COMMUNITY)
Admission: EM | Admit: 2015-08-24 | Discharge: 2015-08-25 | Disposition: A | Discharge status: Home / Self Care | Payer: No Typology Code available for payment source | Attending: Emergency Medicine | Admitting: Emergency Medicine

## 2015-08-24 ENCOUNTER — Encounter (HOSPITAL_COMMUNITY): Payer: Self-pay | Admitting: Emergency Medicine

## 2015-08-24 DIAGNOSIS — Z79899 Other long term (current) drug therapy: Secondary | ICD-10-CM | POA: Insufficient documentation

## 2015-08-24 DIAGNOSIS — F319 Bipolar disorder, unspecified: Secondary | ICD-10-CM | POA: Insufficient documentation

## 2015-08-24 DIAGNOSIS — R011 Cardiac murmur, unspecified: Secondary | ICD-10-CM | POA: Insufficient documentation

## 2015-08-24 DIAGNOSIS — I10 Essential (primary) hypertension: Secondary | ICD-10-CM | POA: Insufficient documentation

## 2015-08-24 DIAGNOSIS — F17210 Nicotine dependence, cigarettes, uncomplicated: Secondary | ICD-10-CM | POA: Insufficient documentation

## 2015-08-24 DIAGNOSIS — F1010 Alcohol abuse, uncomplicated: Secondary | ICD-10-CM | POA: Insufficient documentation

## 2015-08-24 DIAGNOSIS — K859 Acute pancreatitis without necrosis or infection, unspecified: Secondary | ICD-10-CM | POA: Insufficient documentation

## 2015-08-24 DIAGNOSIS — K219 Gastro-esophageal reflux disease without esophagitis: Secondary | ICD-10-CM | POA: Insufficient documentation

## 2015-08-24 DIAGNOSIS — J45909 Unspecified asthma, uncomplicated: Secondary | ICD-10-CM | POA: Insufficient documentation

## 2015-08-24 DIAGNOSIS — Z8701 Personal history of pneumonia (recurrent): Secondary | ICD-10-CM | POA: Insufficient documentation

## 2015-08-24 DIAGNOSIS — K861 Other chronic pancreatitis: Secondary | ICD-10-CM | POA: Insufficient documentation

## 2015-08-24 DIAGNOSIS — Z9889 Other specified postprocedural states: Secondary | ICD-10-CM | POA: Insufficient documentation

## 2015-08-24 DIAGNOSIS — G8929 Other chronic pain: Secondary | ICD-10-CM | POA: Insufficient documentation

## 2015-08-24 DIAGNOSIS — Z8781 Personal history of (healed) traumatic fracture: Secondary | ICD-10-CM | POA: Insufficient documentation

## 2015-08-24 DIAGNOSIS — Z862 Personal history of diseases of the blood and blood-forming organs and certain disorders involving the immune mechanism: Secondary | ICD-10-CM | POA: Insufficient documentation

## 2015-08-24 DIAGNOSIS — F419 Anxiety disorder, unspecified: Secondary | ICD-10-CM | POA: Insufficient documentation

## 2015-08-24 LAB — CBC
HCT: 42.4 % (ref 39.0–52.0)
Hemoglobin: 14.2 g/dL (ref 13.0–17.0)
MCH: 30.7 pg (ref 26.0–34.0)
MCHC: 33.5 g/dL (ref 30.0–36.0)
MCV: 91.6 fL (ref 78.0–100.0)
Platelets: 179 10*3/uL (ref 150–400)
RBC: 4.63 MIL/uL (ref 4.22–5.81)
RDW: 13.9 % (ref 11.5–15.5)
WBC: 8 10*3/uL (ref 4.0–10.5)

## 2015-08-24 LAB — URINALYSIS, ROUTINE W REFLEX MICROSCOPIC
Bilirubin Urine: NEGATIVE
Glucose, UA: NEGATIVE mg/dL
Hgb urine dipstick: NEGATIVE
KETONES UR: NEGATIVE mg/dL
LEUKOCYTES UA: NEGATIVE
NITRITE: NEGATIVE
PH: 5.5 (ref 5.0–8.0)
Protein, ur: NEGATIVE mg/dL
SPECIFIC GRAVITY, URINE: 1.021 (ref 1.005–1.030)

## 2015-08-24 LAB — COMPREHENSIVE METABOLIC PANEL
ALT: 53 U/L (ref 17–63)
AST: 61 U/L — AB (ref 15–41)
Albumin: 4.5 g/dL (ref 3.5–5.0)
Alkaline Phosphatase: 115 U/L (ref 38–126)
Anion gap: 13 (ref 5–15)
BUN: 12 mg/dL (ref 6–20)
CHLORIDE: 102 mmol/L (ref 101–111)
CO2: 22 mmol/L (ref 22–32)
CREATININE: 0.99 mg/dL (ref 0.61–1.24)
Calcium: 9 mg/dL (ref 8.9–10.3)
GFR calc non Af Amer: >60 mL/min (ref >60)
Glucose, Bld: 106 mg/dL — ABNORMAL HIGH (ref 65–99)
Potassium: 4.3 mmol/L (ref 3.5–5.1)
SODIUM: 137 mmol/L (ref 135–145)
Total Bilirubin: 0.7 mg/dL (ref 0.3–1.2)
Total Protein: 8.1 g/dL (ref 6.5–8.1)

## 2015-08-24 LAB — LIPASE, BLOOD: LIPASE: 143 U/L — AB (ref 11–51)

## 2015-08-24 MED ORDER — HYDROMORPHONE HCL 1 MG/ML IJ SOLN
1.0000 mg | Freq: Once | INTRAMUSCULAR | Status: DC
  Administered 2015-08-24: 1 mg via INTRAVENOUS
  Filled 2015-08-24: qty 1

## 2015-08-24 MED ORDER — LABETALOL HCL 5 MG/ML IV SOLN
20.0000 mg | Freq: Once | INTRAVENOUS | Status: AC
  Administered 2015-08-24: 20 mg via INTRAVENOUS
  Filled 2015-08-24: qty 4

## 2015-08-24 MED ORDER — ALUMINUM & MAGNESIUM HYDROXIDE 200-200 MG/5ML PO SUSP: 15.0000 mL | ORAL | Status: DC | PRN

## 2015-08-24 MED ORDER — ONDANSETRON HCL 4 MG/2ML IJ SOLN
4.0000 mg | Freq: Once | INTRAMUSCULAR | Status: AC | PRN
  Administered 2015-08-24: 4 mg via INTRAVENOUS
  Filled 2015-08-24: qty 2

## 2015-08-24 MED ORDER — GI COCKTAIL ~~LOC~~
30.0000 mL | Freq: Once | ORAL | Status: AC
  Administered 2015-08-24: 30 mL via ORAL
  Filled 2015-08-24: qty 30

## 2015-08-24 MED ORDER — RANITIDINE HCL 150 MG PO TABS: 150.0000 mg | ORAL_TABLET | Freq: Two times a day (BID) | ORAL | Status: DC

## 2015-08-24 MED ORDER — SODIUM CHLORIDE 0.9 % IV BOLUS (SEPSIS)
1000.0000 mL | Freq: Once | INTRAVENOUS | Status: AC
  Administered 2015-08-24: 1000 mL via INTRAVENOUS

## 2015-08-24 MED ORDER — HYDROMORPHONE HCL 1 MG/ML IJ SOLN
1.0000 mg | Freq: Once | INTRAMUSCULAR | Status: AC
  Administered 2015-08-24: 1 mg via INTRAVENOUS
  Filled 2015-08-24: qty 1

## 2015-08-24 MED ORDER — ONDANSETRON 4 MG PO TBDP: 4.0000 mg | ORAL_TABLET | Freq: Three times a day (TID) | ORAL | Status: DC | PRN

## 2015-08-24 MED ORDER — LORAZEPAM 2 MG/ML IJ SOLN
2.0000 mg | Freq: Once | INTRAMUSCULAR | Status: AC
  Administered 2015-08-24: 2 mg via INTRAVENOUS
  Filled 2015-08-24: qty 1

## 2015-08-24 MED ORDER — KETOROLAC TROMETHAMINE 15 MG/ML IJ SOLN
15.0000 mg | Freq: Once | INTRAMUSCULAR | Status: AC
Start: 2015-08-24 — End: 2015-08-24
  Administered 2015-08-24: 15 mg via INTRAVENOUS
  Filled 2015-08-24: qty 1

## 2015-08-24 MED ORDER — ONDANSETRON HCL 4 MG/2ML IJ SOLN
4.0000 mg | Freq: Once | INTRAMUSCULAR | Status: AC
Start: 2015-08-24 — End: 2015-08-24
  Administered 2015-08-24: 4 mg via INTRAVENOUS
  Filled 2015-08-24: qty 2

## 2015-08-24 NOTE — ED Notes (Signed)
MD at bedside. 

## 2015-08-24 NOTE — ED Notes (Signed)
Unable to locate patient.

## 2015-08-24 NOTE — ED Notes (Signed)
Gave pt a cup of ice, per [place] - Therapist, sports.

## 2015-08-24 NOTE — ED Notes (Signed)
abd pain, hx pancreatitis-- started last night, hx of WPW-- -- diarrhea -- vomiting

## 2015-08-24 NOTE — ED Provider Notes (Signed)
CSN: [id number]     Arrival date & time 08/24/15  1208 History   First MD Initiated Contact with Patient 08/24/15 1812     Chief Complaint  Patient presents with  . Abdominal Pain  . Pancreatitis     (Consider location/radiation/quality/duration/timing/severity/associated sxs/prior Treatment) Patient is a 45 y.o. male presenting with abdominal pain. The history is provided by the patient.  Abdominal Pain Pain location:  Epigastric Pain quality: aching and gnawing   Pain radiates to:  Back Pain severity:  Severe Onset quality:  Gradual Duration:  1 day Timing:  Constant Progression:  Worsening Chronicity:  Chronic Context comment:  Alcohol use Relieved by:  Nothing Worsened by:  Palpation and eating Ineffective treatments:  None tried Associated symptoms: belching, nausea and vomiting   Associated symptoms: no chest pain, no chills, no cough, no diarrhea, no dysuria, no fever and no shortness of breath   Risk factors: alcohol abuse     Past Medical History  Diagnosis Date  . Hypertension   . Asthma   . Pancreatitis   . Cocaine abuse   . Depression   . H/O suicide attempt 10/2012  . Heart murmur     "when he was little" (03/06/2013)  . Anemia   . H/O hiatal hernia   . GERD (gastroesophageal reflux disease)   . Anxiety   . WPW (Wolff-Parkinson-White syndrome)     [name] 03/06/2013  . High cholesterol   . Femoral condyle fracture ([place]) 03/08/2014    left medial/notes 03/09/2014  . Alcoholism /alcohol abuse ([place])   . Family history of adverse reaction to anesthesia     "grandmother gets confused"  . Shortness of breath     "can happen at anytime" (03/06/2013)  . Pneumonia 1990's X 3  . Chronic bronchitis ([place])   . Sickle cell trait ([place])   . History of blood transfusion 10/2012    "when I tried to commit suicide"  . History of stomach ulcers   . Migraine     "a few times/year" (03/26/2015)  . Arthritis     "knees; arms; elbows" (03/26/2015)  . Chronic lower  back pain   . Bipolar disorder ([place])   . PTSD (post-traumatic stress disorder)   . Marijuana abuse, continuous    Past Surgical History  Procedure Laterality Date  . Facial fracture surgery Left 1990's    "result of trauma"   . Eye surgery Left 1990's    "result of trauma"   . Left heart catheterization with coronary angiogram Right 03/07/2013    Procedure: LEFT HEART CATHETERIZATION WITH CORONARY ANGIOGRAM;  Surgeon: [name], MD;  Location: [facility]ATH [facility];  Service: Cardiovascular;  Laterality: Right;  . Cardiac catheterization    . Fracture surgery    . Umbilical hernia repair    . Hernia repair     Family History  Problem Relation Age of Onset  . Hypertension Other   . Coronary artery disease Other    Social History  Substance Use Topics  . Smoking status: Current Every Day Smoker -- 1.00 packs/day for 30 years    Types: Cigarettes  . Smokeless tobacco: Current User    Types: Chew  . Alcohol Use: 37.8 oz/week    63 Cans of beer per week     Comment: last drink was 06/18/15    Review of Systems  Constitutional: Negative for fever and chills.  HENT: Negative for congestion and rhinorrhea.   Eyes: Negative for visual disturbance.  Respiratory: Negative  for cough and shortness of breath.   Cardiovascular: Negative for chest pain.  Gastrointestinal: Positive for nausea, vomiting and abdominal pain. Negative for diarrhea.  Genitourinary: Negative for dysuria and flank pain.  Musculoskeletal: Negative for neck pain and neck stiffness.  Skin: Negative for rash.  Allergic/Immunologic: Negative for immunocompromised state.  Neurological: Negative for syncope and headaches.      Allergies  Shellfish-derived products and Trazodone and nefazodone  Home Medications   Prior to Admission medications   Medication Sig Start Date End Date Taking? Authorizing Provider  acetaminophen-codeine (TYLENOL #3) 300-30 MG tablet Take 1 tablet by mouth every 4 (four) hours as needed  for moderate pain.    Historical Provider, MD  albuterol (PROVENTIL HFA;VENTOLIN HFA) 108 (90 BASE) MCG/ACT inhaler Inhale 2 puffs into the lungs every 6 (six) hours as needed for wheezing or shortness of breath (wheezing).     Historical Provider, MD  alum & mag hydroxide-simeth (MAALOX PLUS) 400-400-40 MG/5ML suspension Take 10 mLs by mouth every 6 (six) hours as needed for indigestion.    Historical Provider, MD  aluminum-magnesium hydroxide 200-200 MG/5ML suspension Take 15 mLs by mouth as needed for indigestion. 08/24/15   [name], MD  fluticasone (FLONASE) 50 MCG/ACT nasal spray Place 1 spray into both nostrils daily as needed for allergies or rhinitis.    Historical Provider, MD  folic acid (FOLVITE) 1 MG tablet Take 1 tablet (1 mg total) by mouth daily. 01/15/15   [name], MD  HYDROcodone-acetaminophen (NORCO/VICODIN) 5-325 MG tablet Take 2 tablets by mouth every 4 (four) hours as needed. Patient taking differently: Take 2 tablets by mouth every 4 (four) hours as needed for moderate pain.  06/26/15   [name], PA-C  lipase/protease/amylase (CREON) 12000 UNITS CPEP capsule Take 2 capsules (24,000 Units total) by mouth 3 (three) times daily with meals. 01/15/15   [name], MD  metoprolol tartrate (LOPRESSOR) 25 MG tablet Take 1 tablet (25 mg total) by mouth 2 (two) times daily. 01/15/15   [name], MD  Multiple Vitamin (MULTIVITAMIN) capsule Take 1 capsule by mouth daily. 02/06/14   [name], MD  omeprazole (PRILOSEC) 20 MG capsule Take 1 capsule (20 mg total) by mouth daily. 04/20/15   [name], PA-C  ondansetron (ZOFRAN ODT) 4 MG disintegrating tablet Take 1 tablet (4 mg total) by mouth every 8 (eight) hours as needed for nausea or vomiting. 08/24/15   [name], MD  ondansetron (ZOFRAN) 4 MG tablet Take 1 tablet (4 mg total) by mouth every 6 (six) hours. 06/25/15   Stevi Barrett, PA-C  oxyCODONE-acetaminophen (PERCOCET/ROXICET) 5-325  MG tablet Take 1 tablet by mouth every 4 (four) hours as needed for severe pain. 05/25/15   [name], MD  pantoprazole (PROTONIX) 20 MG tablet Take 1 tablet (20 mg total) by mouth daily. Patient not taking: Reported on 07/04/2015 06/26/15   [name], PA-C  promethazine (PHENERGAN) 25 MG tablet Take 1 tablet (25 mg total) by mouth every 6 (six) hours as needed for nausea or vomiting. 06/26/15   [name], PA-C  QUEtiapine (SEROQUEL) 50 MG tablet Take 100 mg by mouth at bedtime. 01/15/15   Historical Provider, MD  ranitidine (ZANTAC) 150 MG tablet Take 1 tablet (150 mg total) by mouth 2 (two) times daily. 08/24/15   [name], MD  sertraline (ZOLOFT) 25 MG tablet Take 75 mg by mouth daily.    Historical Provider, MD  sildenafil (VIAGRA) 50 MG tablet Take 1  tablet (50 mg total) by mouth daily as needed for erectile dysfunction. 03/31/15   [name], MD  sucralfate (CARAFATE) 1 g tablet Take 1 tablet (1 g total) by mouth 4 (four) times daily -  with meals and at bedtime. 06/26/15   [name], PA-C  vitamin B-12 500 MCG tablet Take 1 tablet (500 mcg total) by mouth daily. 03/30/15   [name], MD   BP 131/89 mmHg  Pulse 88  Temp(Src) 97.6 F (36.4 C) (Oral)  Resp 20  Ht 5\' 8"  (1.727 m)  Wt 66.679 kg  BMI 22.36 kg/m2  SpO2 99% Physical Exam  Constitutional: He is oriented to person, place, and time. He appears well-developed and well-nourished. No distress.  HENT:  Head: Normocephalic.  Mouth/Throat: No oropharyngeal exudate.  Neck: Normal range of motion. Neck supple.  Cardiovascular: Normal rate, regular rhythm, normal heart sounds and intact distal pulses.  Exam reveals no friction rub.   No murmur heard. Pulmonary/Chest: Effort normal and breath sounds normal. No respiratory distress. He has no wheezes. He has no rales.  Abdominal: Soft. He exhibits no distension. There is tenderness in the epigastric area. There is no rebound and no guarding.  Musculoskeletal: He  exhibits no edema.  Neurological: He is alert and oriented to person, place, and time.  Skin: No rash noted.  Nursing note and vitals reviewed.   ED Course  Procedures (including critical care time) Labs Review Labs Reviewed  LIPASE, BLOOD - Abnormal; Notable for the following:    Lipase 143 (*)    All other components within normal limits  COMPREHENSIVE METABOLIC PANEL - Abnormal; Notable for the following:    Glucose, Bld 106 (*)    AST 61 (*)    All other components within normal limits  CBC  URINALYSIS, ROUTINE W REFLEX MICROSCOPIC (NOT AT [facility])    Imaging Review No results found. I have personally reviewed and evaluated these images and lab results as part of my medical decision-making.   EKG Interpretation   Date/Time:  Monday August 24 2015 12:20:16 EDT Ventricular Rate:  122 PR Interval:  112 QRS Duration: 80 QT Interval:  290 QTC Calculation: 413 R Axis:   74 Text Interpretation:  Sinus tachycardia Right atrial enlargement  Nonspecific T wave abnormality Abnormal ECG Nonspecific ST and T wave  abnormality Confirmed by Kathrynn Humble, MD, Thelma Comp [phone]) on 08/24/2015 7:29:11  PM      MDM   45 yo M with PMHx of chronic alcohol dependence, chronic pancreatitis, and h/o recurrent ED visits for chronic abdominal pain who p/w acute exacerbation of epigastric pain. On arrival, pt tachycardic, hypertensive, with mild epigastric TTP. Abdomen not rigid with no guarding or rebound. Suspect acute on chronic pancreatitis 2/2 recent alcohol abuse. Pt has extensive h/o ED visits for same with similar symptoms. No fevers or signs of superinfection or abscess. Given tachycardia, will give IVF, pain control, and f/u labs.  Labs reviewed as above. CBC with no leukocytosis or anemia - doubt necrotic or hemorrhagic pancreatitis. CMP with AST elevation likely 2/2 EtOH abuse, otherwise unremarkable. Lipase 143 - this is increased from his baseline, c/w acute pancreatitis, though he does have  lipase ranging up to 220 in the past. UA unremarkable. EKG non-ischemic. Sx improved after IVF, but pt remains hypertensive. He admits he has not been taking his medications. No signs of HTN urgency. Pt given beta blocker as well as ativan for possible EtOH w/d component.  Following the above, pt well-appearing with  now normal HR and BP. He is tolerating pO in the ED without difficulty. Will d/c with mild acute on chronic pancreatitis. No signs of complication and given o/w reassuring labs and tolerance of PO, do not feel pt needs admission. Pt refused to leave due to requesting more pain meds. Security has escorted out of ED.   Clinical Impression: 1. Acute on chronic pancreatitis ([place])   2. Alcohol abuse     Disposition: Discharge  Condition: Good  I have discussed the results, Dx and Tx plan with the pt(& family if present). He/she/they expressed understanding and agree(s) with the plan. Discharge instructions discussed at great length. Strict return precautions discussed and pt &/or family have verbalized understanding of the instructions. No further questions at time of discharge.    Discharge Medication List as of 08/24/2015 11:25 PM    START taking these medications   Details  ondansetron (ZOFRAN ODT) 4 MG disintegrating tablet Take 1 tablet (4 mg total) by mouth every 8 (eight) hours as needed for nausea or vomiting., Starting 08/24/2015, Until Discontinued, Print        Follow Up: Marliss Coots, NP [address] [place] [address] [phone]   Follow-up in 2-3 days   Pt seen in conjunction with Dr. Hassan Buckler, MD 08/25/15 [place], MD 08/26/15 1236

## 2015-08-25 ENCOUNTER — Encounter (HOSPITAL_COMMUNITY): Payer: Self-pay | Admitting: *Deleted

## 2015-08-25 ENCOUNTER — Emergency Department (HOSPITAL_COMMUNITY)
Admission: EM | Admit: 2015-08-25 | Discharge: 2015-08-26 | Disposition: A | Discharge status: Home / Self Care | Payer: No Typology Code available for payment source | Attending: Emergency Medicine | Admitting: Emergency Medicine

## 2015-08-25 DIAGNOSIS — R011 Cardiac murmur, unspecified: Secondary | ICD-10-CM | POA: Insufficient documentation

## 2015-08-25 DIAGNOSIS — J45909 Unspecified asthma, uncomplicated: Secondary | ICD-10-CM | POA: Insufficient documentation

## 2015-08-25 DIAGNOSIS — F419 Anxiety disorder, unspecified: Secondary | ICD-10-CM | POA: Insufficient documentation

## 2015-08-25 DIAGNOSIS — Z8639 Personal history of other endocrine, nutritional and metabolic disease: Secondary | ICD-10-CM | POA: Insufficient documentation

## 2015-08-25 DIAGNOSIS — Z862 Personal history of diseases of the blood and blood-forming organs and certain disorders involving the immune mechanism: Secondary | ICD-10-CM | POA: Insufficient documentation

## 2015-08-25 DIAGNOSIS — Z8739 Personal history of other diseases of the musculoskeletal system and connective tissue: Secondary | ICD-10-CM | POA: Insufficient documentation

## 2015-08-25 DIAGNOSIS — K858 Other acute pancreatitis without necrosis or infection: Secondary | ICD-10-CM | POA: Insufficient documentation

## 2015-08-25 DIAGNOSIS — Z8701 Personal history of pneumonia (recurrent): Secondary | ICD-10-CM | POA: Insufficient documentation

## 2015-08-25 DIAGNOSIS — F17210 Nicotine dependence, cigarettes, uncomplicated: Secondary | ICD-10-CM | POA: Insufficient documentation

## 2015-08-25 DIAGNOSIS — R000 Tachycardia, unspecified: Secondary | ICD-10-CM | POA: Insufficient documentation

## 2015-08-25 DIAGNOSIS — Z79899 Other long term (current) drug therapy: Secondary | ICD-10-CM | POA: Insufficient documentation

## 2015-08-25 DIAGNOSIS — F319 Bipolar disorder, unspecified: Secondary | ICD-10-CM | POA: Insufficient documentation

## 2015-08-25 DIAGNOSIS — Z8781 Personal history of (healed) traumatic fracture: Secondary | ICD-10-CM | POA: Insufficient documentation

## 2015-08-25 DIAGNOSIS — K219 Gastro-esophageal reflux disease without esophagitis: Secondary | ICD-10-CM | POA: Insufficient documentation

## 2015-08-25 DIAGNOSIS — G8929 Other chronic pain: Secondary | ICD-10-CM | POA: Insufficient documentation

## 2015-08-25 DIAGNOSIS — Z9889 Other specified postprocedural states: Secondary | ICD-10-CM | POA: Insufficient documentation

## 2015-08-25 DIAGNOSIS — I10 Essential (primary) hypertension: Secondary | ICD-10-CM | POA: Insufficient documentation

## 2015-08-25 LAB — CBC WITH DIFFERENTIAL/PLATELET
Basophils Absolute: 0 10*3/uL (ref 0.0–0.1)
Basophils Relative: 0 %
Eosinophils Absolute: 0.4 10*3/uL (ref 0.0–0.7)
Eosinophils Relative: 5 %
HCT: 32.6 % — ABNORMAL LOW (ref 39.0–52.0)
Hemoglobin: 10.9 g/dL — ABNORMAL LOW (ref 13.0–17.0)
Lymphocytes Relative: 31 %
Lymphs Abs: 2.6 10*3/uL (ref 0.7–4.0)
MCH: 31.1 pg (ref 26.0–34.0)
MCHC: 33.4 g/dL (ref 30.0–36.0)
MCV: 93.1 fL (ref 78.0–100.0)
Monocytes Absolute: 0.5 10*3/uL (ref 0.1–1.0)
Monocytes Relative: 6 %
Neutro Abs: 5 10*3/uL (ref 1.7–7.7)
Neutrophils Relative %: 59 %
Platelets: 127 10*3/uL — ABNORMAL LOW (ref 150–400)
RBC: 3.5 MIL/uL — ABNORMAL LOW (ref 4.22–5.81)
RDW: 13.9 % (ref 11.5–15.5)
WBC: 8.5 10*3/uL (ref 4.0–10.5)

## 2015-08-25 LAB — COMPREHENSIVE METABOLIC PANEL
ALT: 37 U/L (ref 17–63)
AST: 47 U/L — ABNORMAL HIGH (ref 15–41)
Albumin: 3.4 g/dL — ABNORMAL LOW (ref 3.5–5.0)
Alkaline Phosphatase: 87 U/L (ref 38–126)
Anion gap: 10 (ref 5–15)
BUN: 8 mg/dL (ref 6–20)
CO2: 22 mmol/L (ref 22–32)
Calcium: 8.4 mg/dL — ABNORMAL LOW (ref 8.9–10.3)
Chloride: 107 mmol/L (ref 101–111)
Creatinine, Ser: 0.67 mg/dL (ref 0.61–1.24)
GFR calc Af Amer: >60 mL/min (ref >60)
GFR calc non Af Amer: >60 mL/min (ref >60)
Glucose, Bld: 113 mg/dL — ABNORMAL HIGH (ref 65–99)
Potassium: 3.7 mmol/L (ref 3.5–5.1)
Sodium: 139 mmol/L (ref 135–145)
Total Bilirubin: 0.6 mg/dL (ref 0.3–1.2)
Total Protein: 6.3 g/dL — ABNORMAL LOW (ref 6.5–8.1)

## 2015-08-25 LAB — POC OCCULT BLOOD, ED: Fecal Occult Bld: NEGATIVE

## 2015-08-25 LAB — LIPASE, BLOOD: Lipase: 108 U/L — ABNORMAL HIGH (ref 11–51)

## 2015-08-25 MED ORDER — SODIUM CHLORIDE 0.9 % IV BOLUS (SEPSIS)
1000.0000 mL | Freq: Once | INTRAVENOUS | Status: AC
  Administered 2015-08-25: 1000 mL via INTRAVENOUS

## 2015-08-25 MED ORDER — GI COCKTAIL ~~LOC~~
30.0000 mL | Freq: Once | ORAL | Status: AC
  Administered 2015-08-26: 30 mL via ORAL
  Filled 2015-08-25: qty 30

## 2015-08-25 MED ORDER — FENTANYL CITRATE (PF) 100 MCG/2ML IJ SOLN
100.0000 ug | Freq: Once | INTRAMUSCULAR | Status: AC
  Administered 2015-08-25: 100 ug via INTRAVENOUS
  Filled 2015-08-25: qty 2

## 2015-08-25 MED ORDER — FENTANYL CITRATE (PF) 100 MCG/2ML IJ SOLN
100.0000 ug | Freq: Once | INTRAMUSCULAR | Status: AC
Start: 2015-08-25 — End: 2015-08-26
  Administered 2015-08-26: 100 ug via INTRAVENOUS
  Filled 2015-08-25: qty 2

## 2015-08-25 NOTE — ED Provider Notes (Signed)
CSN: [id number]     Arrival date & time 08/25/15  1446 History   First MD Initiated Contact with Patient 08/25/15 2122     Chief Complaint  Patient presents with  . Abdominal Pain    HPI The patient is a 45 year old black male with hx of chronic pancreatitis and substance abuse who was discharged from ED last night at 2a. He presented with acute abdominal pain, nausea and diarrhea, treated with IVF, pain meds and GI cocktail and d/c. The patient presents again today with the same complaints of epigastric abdominal pain, nausea and diarrhea with pain radiation around left trunk to middle back. States he took sucralfate and omeprazole at 5pm which did not help his symptoms. The patient endorses decreased alcohol intake over the past two months which have reduced his attacks, but does endorse eating large amounts of fatty foods and one beer for Easter dinner 4/16 before attack started.    Past Medical History  Diagnosis Date  . Hypertension   . Asthma   . Pancreatitis   . Cocaine abuse   . Depression   . H/O suicide attempt 10/2012  . Heart murmur     "when he was little" (03/06/2013)  . Anemia   . H/O hiatal hernia   . GERD (gastroesophageal reflux disease)   . Anxiety   . WPW (Wolff-Parkinson-White syndrome)     [name] 03/06/2013  . High cholesterol   . Femoral condyle fracture ([place]) 03/08/2014    left medial/notes 03/09/2014  . Alcoholism /alcohol abuse ([place])   . Family history of adverse reaction to anesthesia     "grandmother gets confused"  . Shortness of breath     "can happen at anytime" (03/06/2013)  . Pneumonia 1990's X 3  . Chronic bronchitis ([place])   . Sickle cell trait ([place])   . History of blood transfusion 10/2012    "when I tried to commit suicide"  . History of stomach ulcers   . Migraine     "a few times/year" (03/26/2015)  . Arthritis     "knees; arms; elbows" (03/26/2015)  . Chronic lower back pain   . Bipolar disorder ([place])   . PTSD (post-traumatic stress  disorder)   . Marijuana abuse, continuous    Past Surgical History  Procedure Laterality Date  . Facial fracture surgery Left 1990's    "result of trauma"   . Eye surgery Left 1990's    "result of trauma"   . Left heart catheterization with coronary angiogram Right 03/07/2013    Procedure: LEFT HEART CATHETERIZATION WITH CORONARY ANGIOGRAM;  Surgeon: [name], MD;  Location: [facility] CATH [facility];  Service: Cardiovascular;  Laterality: Right;  . Cardiac catheterization    . Fracture surgery    . Umbilical hernia repair    . Hernia repair     Family History  Problem Relation Age of Onset  . Hypertension Other   . Coronary artery disease Other    Social History  Substance Use Topics  . Smoking status: Current Every Day Smoker -- 1.00 packs/day for 30 years    Types: Cigarettes  . Smokeless tobacco: Current User    Types: Chew  . Alcohol Use: 37.8 oz/week    63 Cans of beer per week     Comment: last drink was 06/18/15    Review of Systems All pertinent ROS as above in HPI   Allergies  Shellfish-derived products and Trazodone and nefazodone  Home Medications   Prior to  Admission medications   Medication Sig Start Date End Date Taking? Authorizing Provider  albuterol (PROVENTIL HFA;VENTOLIN HFA) 108 (90 BASE) MCG/ACT inhaler Inhale 2 puffs into the lungs every 6 (six) hours as needed for wheezing or shortness of breath (wheezing).    Yes Historical Provider, MD  lipase/protease/amylase (CREON) 12000 UNITS CPEP capsule Take 2 capsules (24,000 Units total) by mouth 3 (three) times daily with meals. 01/15/15  Yes [name], MD  metoprolol tartrate (LOPRESSOR) 25 MG tablet Take 1 tablet (25 mg total) by mouth 2 (two) times daily. 01/15/15  Yes [name], MD  ondansetron (ZOFRAN ODT) 4 MG disintegrating tablet Take 1 tablet (4 mg total) by mouth every 8 (eight) hours as needed for nausea or vomiting. 08/24/15  Yes [name], MD  QUEtiapine (SEROQUEL) 50 MG tablet  Take 100 mg by mouth at bedtime. 01/15/15  Yes Historical Provider, MD  ranitidine (ZANTAC) 150 MG tablet Take 1 tablet (150 mg total) by mouth 2 (two) times daily. 08/24/15  Yes [name], MD  sertraline (ZOLOFT) 25 MG tablet Take 75 mg by mouth daily.   Yes Historical Provider, MD  sildenafil (VIAGRA) 50 MG tablet Take 1 tablet (50 mg total) by mouth daily as needed for erectile dysfunction. 03/31/15  Yes Olugbemiga E Doreene Burke, MD   BP 141/98 mmHg  Pulse 96  Temp(Src) 97.8 F (36.6 C) (Oral)  Resp 16  Wt 67.132 kg  SpO2 100% Physical Exam  Constitutional: He is oriented to person, place, and time. He appears well-developed and well-nourished. No distress.  HENT:  Head: Normocephalic and atraumatic.  Mouth/Throat: Oropharynx is clear and moist.  Eyes: EOM are normal. Pupils are equal, round, and reactive to light.  Neck: Normal range of motion. Neck supple.  Cardiovascular: Normal rate, regular rhythm and intact distal pulses.  Exam reveals no gallop and no friction rub.   No murmur heard. tachycardia  Pulmonary/Chest: Effort normal and breath sounds normal. No respiratory distress. He has no wheezes.  Abdominal: Soft. Bowel sounds are normal. He exhibits no distension. There is tenderness. There is no rebound and no guarding.  Tenderness to palpation over epigastric and left upper quadrant  Musculoskeletal: He exhibits no edema.  Neurological: He is alert and oriented to person, place, and time. He exhibits normal muscle tone. Coordination normal.  Skin: Skin is warm and dry. No rash noted. He is not diaphoretic. No erythema.  Psychiatric: He has a normal mood and affect. His behavior is normal.  Nursing note and vitals reviewed.   ED Course  Procedures (including critical care time) Labs Review Results for orders placed or performed during the hospital encounter of 08/25/15  CBC with Differential  Result Value Ref Range   WBC 8.5 4.0 - 10.5 K/uL   RBC 3.50 (L) 4.22 - 5.81  MIL/uL   Hemoglobin 10.9 (L) 13.0 - 17.0 g/dL   HCT 32.6 (L) 39.0 - 52.0 %   MCV 93.1 78.0 - 100.0 fL   MCH 31.1 26.0 - 34.0 pg   MCHC 33.4 30.0 - 36.0 g/dL   RDW 13.9 11.5 - 15.5 %   Platelets 127 (L) 150 - 400 K/uL   Neutrophils Relative % 59 %   Neutro Abs 5.0 1.7 - 7.7 K/uL   Lymphocytes Relative 31 %   Lymphs Abs 2.6 0.7 - 4.0 K/uL   Monocytes Relative 6 %   Monocytes Absolute 0.5 0.1 - 1.0 K/uL   Eosinophils Relative 5 %   Eosinophils Absolute  0.4 0.0 - 0.7 K/uL   Basophils Relative 0 %   Basophils Absolute 0.0 0.0 - 0.1 K/uL  Comprehensive metabolic panel  Result Value Ref Range   Sodium 139 135 - 145 mmol/L   Potassium 3.7 3.5 - 5.1 mmol/L   Chloride 107 101 - 111 mmol/L   CO2 22 22 - 32 mmol/L   Glucose, Bld 113 (H) 65 - 99 mg/dL   BUN 8 6 - 20 mg/dL   Creatinine, Ser 0.67 0.61 - 1.24 mg/dL   Calcium 8.4 (L) 8.9 - 10.3 mg/dL   Total Protein 6.3 (L) 6.5 - 8.1 g/dL   Albumin 3.4 (L) 3.5 - 5.0 g/dL   AST 47 (H) 15 - 41 U/L   ALT 37 17 - 63 U/L   Alkaline Phosphatase 87 38 - 126 U/L   Total Bilirubin 0.6 0.3 - 1.2 mg/dL   GFR calc non Af Amer >60 >60 mL/min   GFR calc Af Amer >60 >60 mL/min   Anion gap 10 5 - 15  Lipase, blood  Result Value Ref Range   Lipase 108 (H) 11 - 51 U/L  POC occult blood, ED RN will collect  Result Value Ref Range   Fecal Occult Bld NEGATIVE NEGATIVE   Imaging Review No results found. I have personally reviewed and evaluated these images and lab results as part of my medical decision-making.   MDM   The patient is a 45 year old male who presents with acute on chronic pancreatitis with abdominal pain, nausea and diarrhea. He has hx of multiple ED visits in the past due to similar problem. Lipase elevated at 143 yesterday, today trending down at 108. CBC yesterday showed hemoconcentration of hgb 14, today hgb 10.4, likely well hydrated now vs acute blood loss. No s&s of acute GI bleed, hemoccult checked- negative. Will treat patient  symptomatically with IVF and pain medications. Pain is decreasing with treatment, admission is not indicated at this time, symptomatic control can be managed outpatient.   Underlying problem is multiple presentations of abdominal pain due to pancreatitis with continued alcohol use. Patient states his alcohol use has decreased but admits use of alcohol before pancreatitis attack on 4/16 which prompted his visit to the ED. Patient encouraged to seek psychiatric counseling for addiction, hesitation to send him home on pain medication due to addiction/abuse.    Patient is advised to return here as needed.  Patient agrees the plan and all questions were answered  Dalia Heading, PA-C 08/27/15 [place], MD 08/27/15 2048

## 2015-08-25 NOTE — ED Notes (Signed)
LUQ pain, vomiting x2 today after eating.  Pt was seen yesterday and had a lipase of 140, he was d/c and told to return if he felt worse

## 2015-08-25 NOTE — ED Notes (Signed)
[employer] and security at bedside to escort pt.

## 2015-08-25 NOTE — ED Notes (Signed)
Pt refusing to leave, Cassie RN contacting security to escort pt out

## 2015-08-25 NOTE — ED Notes (Signed)
Pt requesting to speak to MD again prior to discharge. Pt given additional prescriptions as pt discussed with Dr.Isaacs. Also requesting graham crackers and sprite which were provided.

## 2015-08-25 NOTE — ED Notes (Signed)
Security escorting pt out.

## 2015-08-25 NOTE — ED Notes (Signed)
Called for vitals with no response. Pt has been in and out of waiting room

## 2015-08-25 NOTE — ED Notes (Signed)
No LABS due to careplan.  Pt ambulatory in no acute diestress

## 2015-08-26 MED ORDER — PROMETHAZINE HCL 25 MG PO TABS: 25.0000 mg | ORAL_TABLET | Freq: Three times a day (TID) | ORAL | Status: DC | PRN

## 2015-08-26 MED ORDER — ONDANSETRON HCL 4 MG/2ML IJ SOLN
4.0000 mg | Freq: Once | INTRAMUSCULAR | Status: AC
  Administered 2015-08-26: 4 mg via INTRAVENOUS
  Filled 2015-08-26: qty 2

## 2015-08-26 MED ORDER — OXYCODONE-ACETAMINOPHEN 5-325 MG PO TABS: 1.0000 | ORAL_TABLET | Freq: Four times a day (QID) | ORAL | Status: DC | PRN

## 2015-08-26 NOTE — Discharge Instructions (Signed)
Return here as needed.  Follow-up with your primary care doctor °

## 2015-08-26 NOTE — ED Notes (Signed)
Pt left with all his belongings and ambulated out of the treatment area.  

## 2015-08-27 IMAGING — CR DG CHEST 2V
2 series · 2 of 2 positions shown · non-contrast
Comparison: 10/02/2012. 03/13/2011.

CLINICAL DATA: Chest pain. Hypertension. Headache.

EXAM:
CHEST  2 VIEW

[w chest pa]
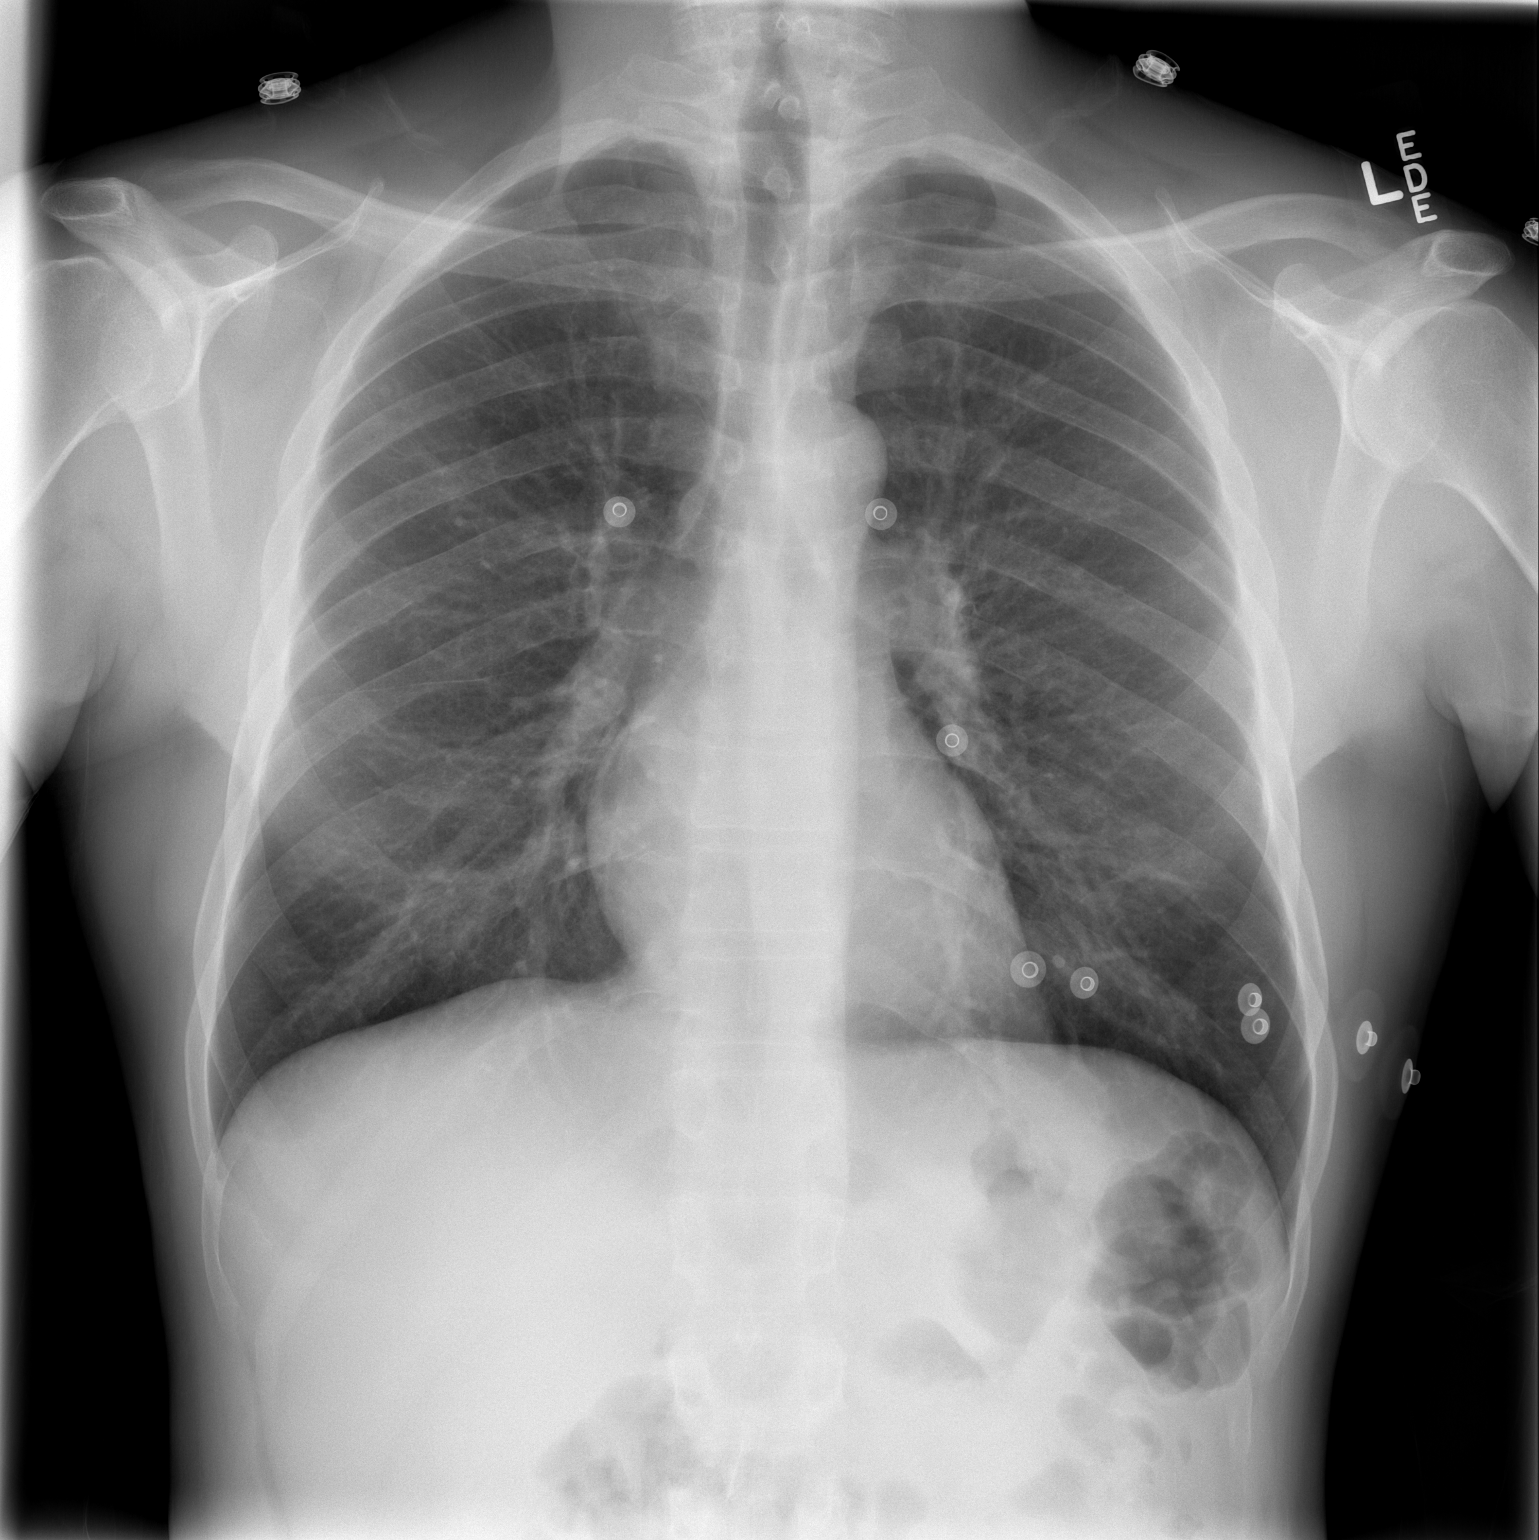

[w chest lat]
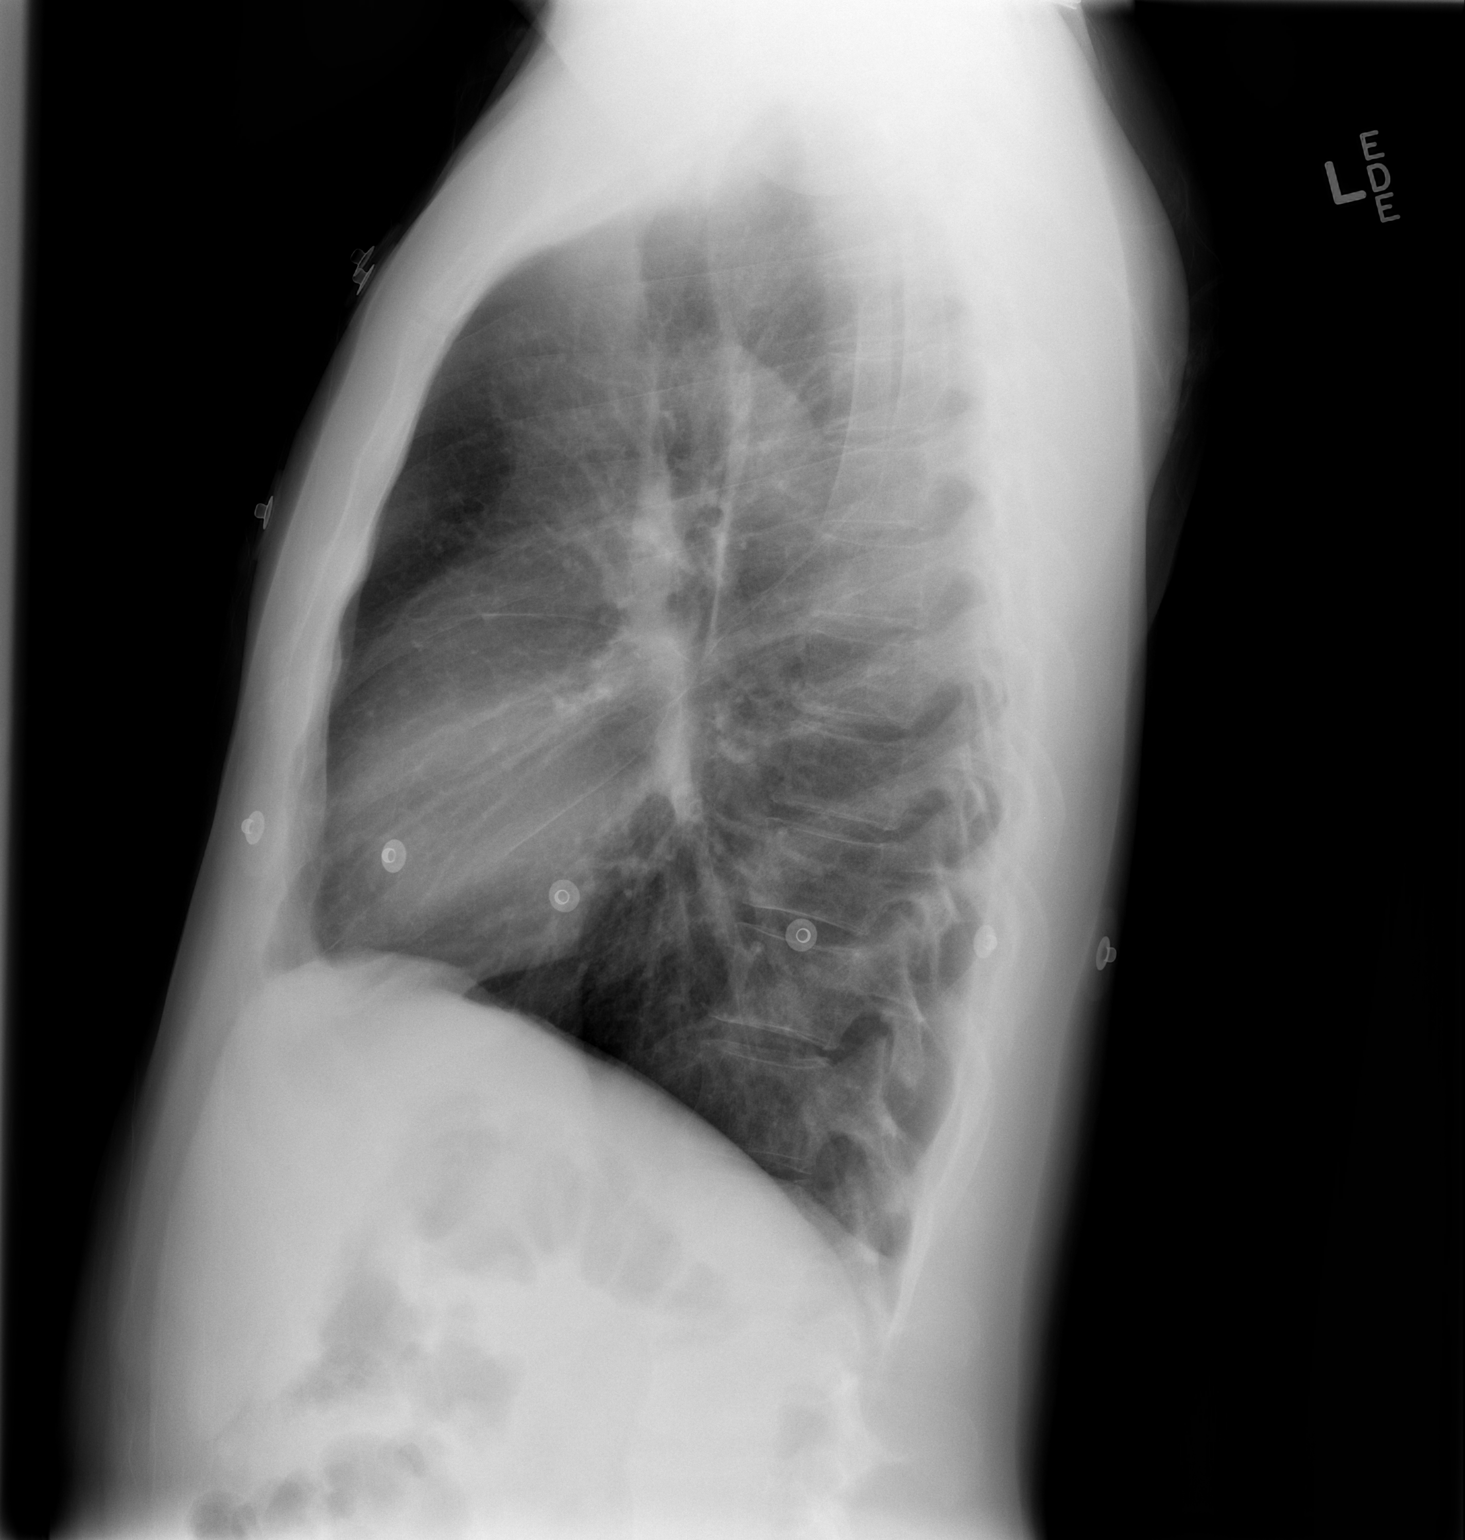

[2 of 2 positions shown; findings below may reference images not displayed]

FINDINGS: Cardiopericardial silhouette and mediastinal contours are normal. No
airspace disease or effusion. Mild basilar atelectasis. Bones appear
within normal limits.
IMPRESSION: No active cardiopulmonary disease.

## 2015-09-05 ENCOUNTER — Encounter (HOSPITAL_COMMUNITY): Payer: Self-pay | Admitting: Emergency Medicine

## 2015-09-05 ENCOUNTER — Emergency Department (HOSPITAL_COMMUNITY)
Admission: EM | Admit: 2015-09-05 | Discharge: 2015-09-06 | Disposition: A | Discharge status: Home / Self Care | Payer: No Typology Code available for payment source | Attending: Emergency Medicine | Admitting: Emergency Medicine

## 2015-09-05 DIAGNOSIS — Z862 Personal history of diseases of the blood and blood-forming organs and certain disorders involving the immune mechanism: Secondary | ICD-10-CM | POA: Insufficient documentation

## 2015-09-05 DIAGNOSIS — J45909 Unspecified asthma, uncomplicated: Secondary | ICD-10-CM | POA: Insufficient documentation

## 2015-09-05 DIAGNOSIS — R079 Chest pain, unspecified: Secondary | ICD-10-CM | POA: Insufficient documentation

## 2015-09-05 DIAGNOSIS — F419 Anxiety disorder, unspecified: Secondary | ICD-10-CM | POA: Insufficient documentation

## 2015-09-05 DIAGNOSIS — K219 Gastro-esophageal reflux disease without esophagitis: Secondary | ICD-10-CM | POA: Insufficient documentation

## 2015-09-05 DIAGNOSIS — R112 Nausea with vomiting, unspecified: Secondary | ICD-10-CM | POA: Insufficient documentation

## 2015-09-05 DIAGNOSIS — I10 Essential (primary) hypertension: Secondary | ICD-10-CM | POA: Insufficient documentation

## 2015-09-05 DIAGNOSIS — G8929 Other chronic pain: Secondary | ICD-10-CM | POA: Insufficient documentation

## 2015-09-05 DIAGNOSIS — R1013 Epigastric pain: Secondary | ICD-10-CM | POA: Insufficient documentation

## 2015-09-05 DIAGNOSIS — Z8701 Personal history of pneumonia (recurrent): Secondary | ICD-10-CM | POA: Insufficient documentation

## 2015-09-05 DIAGNOSIS — Z79899 Other long term (current) drug therapy: Secondary | ICD-10-CM | POA: Insufficient documentation

## 2015-09-05 DIAGNOSIS — F17210 Nicotine dependence, cigarettes, uncomplicated: Secondary | ICD-10-CM | POA: Insufficient documentation

## 2015-09-05 DIAGNOSIS — R109 Unspecified abdominal pain: Secondary | ICD-10-CM

## 2015-09-05 DIAGNOSIS — R011 Cardiac murmur, unspecified: Secondary | ICD-10-CM | POA: Insufficient documentation

## 2015-09-05 DIAGNOSIS — Z8781 Personal history of (healed) traumatic fracture: Secondary | ICD-10-CM | POA: Insufficient documentation

## 2015-09-05 DIAGNOSIS — F319 Bipolar disorder, unspecified: Secondary | ICD-10-CM | POA: Insufficient documentation

## 2015-09-05 DIAGNOSIS — M545 Low back pain: Secondary | ICD-10-CM | POA: Insufficient documentation

## 2015-09-05 LAB — BASIC METABOLIC PANEL
Anion gap: 16 — ABNORMAL HIGH (ref 5–15)
BUN: 10 mg/dL (ref 6–20)
CO2: 21 mmol/L — ABNORMAL LOW (ref 22–32)
CREATININE: 0.79 mg/dL (ref 0.61–1.24)
Calcium: 9.6 mg/dL (ref 8.9–10.3)
Chloride: 101 mmol/L (ref 101–111)
GFR calc Af Amer: >60 mL/min (ref >60)
Glucose, Bld: 99 mg/dL (ref 65–99)
Potassium: 3.7 mmol/L (ref 3.5–5.1)
SODIUM: 138 mmol/L (ref 135–145)

## 2015-09-05 LAB — CBC
HCT: 41.3 % (ref 39.0–52.0)
Hemoglobin: 13.8 g/dL (ref 13.0–17.0)
MCH: 31.5 pg (ref 26.0–34.0)
MCHC: 33.4 g/dL (ref 30.0–36.0)
MCV: 94.3 fL (ref 78.0–100.0)
PLATELETS: 248 10*3/uL (ref 150–400)
RBC: 4.38 MIL/uL (ref 4.22–5.81)
RDW: 14 % (ref 11.5–15.5)
WBC: 10.6 10*3/uL — AB (ref 4.0–10.5)

## 2015-09-05 LAB — LIPASE, BLOOD: LIPASE: 28 U/L (ref 11–51)

## 2015-09-05 LAB — I-STAT TROPONIN, ED: Troponin i, poc: 0 ng/mL (ref 0.00–0.08)

## 2015-09-05 MED ORDER — ONDANSETRON 4 MG PO TBDP
ORAL_TABLET | ORAL | Status: AC
  Filled 2015-09-05: qty 1

## 2015-09-05 MED ORDER — ONDANSETRON 4 MG PO TBDP
4.0000 mg | ORAL_TABLET | Freq: Once | ORAL | Status: AC
  Administered 2015-09-06: 4 mg via ORAL
  Filled 2015-09-05: qty 1

## 2015-09-05 MED ORDER — ONDANSETRON 4 MG PO TBDP
4.0000 mg | ORAL_TABLET | Freq: Once | ORAL | Status: AC | PRN
  Administered 2015-09-05: 4 mg via ORAL

## 2015-09-05 MED ORDER — GI COCKTAIL ~~LOC~~
30.0000 mL | Freq: Once | ORAL | Status: AC
  Administered 2015-09-06: 30 mL via ORAL
  Filled 2015-09-05: qty 30

## 2015-09-05 NOTE — ED Notes (Signed)
Patient arrives via EMS with complaint of abdominal pain and nausea. Endorses ETOH consumption today. While triaging patient, HR increased to 135 and he began to complain of chest pain. EKG collected. History of chronic pancreatitis.

## 2015-09-05 NOTE — ED Provider Notes (Signed)
CSN: [id number]     Arrival date & time 09/05/15  2024 History  By signing my name below, I, Hansel Feinstein, attest that this documentation has been prepared under the direction and in the presence of Ripley Fraise, MD. Electronically Signed: Hansel Feinstein, ED Scribe. 09/05/2015. 11:57 PM.    Chief Complaint  Patient presents with  . Abdominal Pain   Patient is a 45 y.o. male presenting with abdominal pain. The history is provided by the patient. No language interpreter was used.  Abdominal Pain Pain location:  Epigastric Pain quality: aching   Pain radiates to:  RUQ and RLQ Pain severity:  Moderate Onset quality:  Gradual Duration:  1 day Timing:  Constant Progression:  Unchanged Chronicity:  Chronic Context: alcohol use   Relieved by: Carafate. Worsened by:  Eating Associated symptoms: chest pain (secondary to emesis), nausea and vomiting   Associated symptoms: no diarrhea and no dysuria   Risk factors: alcohol abuse     HPI Comments: [name] is a 45 y.o. male with h/o chronic pancreatitis who presents to the Emergency Department complaining of moderate, acute on chronic epigastric abdominal pain with radiation to the RUQ and RLQ onset last night. Pt states associated NBNB emesis, nausea, back pain. He also reports chest tenderness secondary to emesis. Pt states he is seeing food content in his emesis. Pt is able to tolerate foods and fluids. He notes some relief of pain with Carafate. Pt states his current symptoms are consistent with prior pancreatitis flare-ups. Last alcohol consumption was at 3PM today. Per pt, certain foods exacerbate his pancreatitis as well. Pt is well known to the ED for frequent visits with similar complaints. PCP is with [facility]. Denies diarrhea, hematemesis, melena, hematochezia, dysuria, numbness or weakness to the extremities.   Past Medical History  Diagnosis Date  . Hypertension   . Asthma   . Pancreatitis   . Cocaine abuse    . Depression   . H/O suicide attempt 10/2012  . Heart murmur     "when he was little" (03/06/2013)  . Anemia   . H/O hiatal hernia   . GERD (gastroesophageal reflux disease)   . Anxiety   . WPW (Wolff-Parkinson-White syndrome)     [name] 03/06/2013  . High cholesterol   . Femoral condyle fracture ([place]) 03/08/2014    left medial/notes 03/09/2014  . Alcoholism /alcohol abuse ([place])   . Family history of adverse reaction to anesthesia     "grandmother gets confused"  . Shortness of breath     "can happen at anytime" (03/06/2013)  . Pneumonia 1990's X 3  . Chronic bronchitis ([place])   . Sickle cell trait ([place])   . History of blood transfusion 10/2012    "when I tried to commit suicide"  . History of stomach ulcers   . Migraine     "a few times/year" (03/26/2015)  . Arthritis     "knees; arms; elbows" (03/26/2015)  . Chronic lower back pain   . Bipolar disorder ([place])   . PTSD (post-traumatic stress disorder)   . Marijuana abuse, continuous    Past Surgical History  Procedure Laterality Date  . Facial fracture surgery Left 1990's    "result of trauma"   . Eye surgery Left 1990's    "result of trauma"   . Left heart catheterization with coronary angiogram Right 03/07/2013    Procedure: LEFT HEART CATHETERIZATION WITH CORONARY ANGIOGRAM;  Surgeon: [name], MD;  Location[facility] CATH  LAB;  Service: Cardiovascular;  Laterality: Right;  . Cardiac catheterization    . Fracture surgery    . Umbilical hernia repair    . Hernia repair     Family History  Problem Relation Age of Onset  . Hypertension Other   . Coronary artery disease Other    Social History  Substance Use Topics  . Smoking status: Current Every Day Smoker -- 1.00 packs/day for 30 years    Types: Cigarettes  . Smokeless tobacco: Current User    Types: Chew  . Alcohol Use: 37.8 oz/week    63 Cans of beer per week     Comment: last drink was 06/18/15    Review of Systems  Cardiovascular: Positive for chest  pain (secondary to emesis).  Gastrointestinal: Positive for nausea, vomiting and abdominal pain. Negative for diarrhea and blood in stool.  Genitourinary: Negative for dysuria.  Musculoskeletal: Positive for back pain.  Neurological: Negative for weakness and numbness.  All other systems reviewed and are negative.  Allergies  Shellfish-derived products and Trazodone and nefazodone  Home Medications   Prior to Admission medications   Medication Sig Start Date End Date Taking? Authorizing Provider  albuterol (PROVENTIL HFA;VENTOLIN HFA) 108 (90 BASE) MCG/ACT inhaler Inhale 2 puffs into the lungs every 6 (six) hours as needed for wheezing or shortness of breath (wheezing).     Historical Provider, MD  lipase/protease/amylase (CREON) 12000 UNITS CPEP capsule Take 2 capsules (24,000 Units total) by mouth 3 (three) times daily with meals. 01/15/15   [name], MD  metoprolol tartrate (LOPRESSOR) 25 MG tablet Take 1 tablet (25 mg total) by mouth 2 (two) times daily. 01/15/15   [name], MD  ondansetron (ZOFRAN ODT) 4 MG disintegrating tablet Take 1 tablet (4 mg total) by mouth every 8 (eight) hours as needed for nausea or vomiting. 08/24/15   [name], MD  oxyCODONE-acetaminophen (PERCOCET/ROXICET) 5-325 MG tablet Take 1 tablet by mouth every 6 (six) hours as needed for severe pain. 08/26/15   Dalia Heading, PA-C  promethazine (PHENERGAN) 25 MG tablet Take 1 tablet (25 mg total) by mouth every 8 (eight) hours as needed for nausea or vomiting. 08/26/15   [name], PA-C  QUEtiapine (SEROQUEL) 50 MG tablet Take 100 mg by mouth at bedtime. 01/15/15   Historical Provider, MD  ranitidine (ZANTAC) 150 MG tablet Take 1 tablet (150 mg total) by mouth 2 (two) times daily. 08/24/15   [name], MD  sertraline (ZOLOFT) 25 MG tablet Take 75 mg by mouth daily.    Historical Provider, MD  sildenafil (VIAGRA) 50 MG tablet Take 1 tablet (50 mg total) by mouth daily as needed for  erectile dysfunction. 03/31/15   [name], MD   BP 141/100 mmHg  Pulse 118  Temp(Src) 98.3 F (36.8 C) (Oral)  Resp 15  Ht 5\' 9"  (1.753 m)  Wt 133 lb 4 oz (60.442 kg)  BMI 19.67 kg/m2  SpO2 100% Physical Exam CONSTITUTIONAL: Well developed/well nourished HEAD: Normocephalic/atraumatic EYES: EOMI/PERRL. Scleral icterus noted.  ENMT: Mucous membranes dry.  NECK: supple no meningeal signs SPINE/BACK:entire spine nontender CV: S1/S2 noted, no murmurs/rubs/gallops noted LUNGS: Lungs are clear to auscultation bilaterally, no apparent distress ABDOMEN: soft, mild epigastric tenderness, no rebound or guarding noted GU:no cva tenderness NEURO: Pt is awake/alert/appropriate, moves all extremitiesx4.  No facial droop.   EXTREMITIES: pulses normal/equal, full ROM SKIN: warm, color normal PSYCH: no abnormalities of mood noted, alert and oriented to situation  ED  Course  Procedures  Medications  ondansetron (ZOFRAN-ODT) disintegrating tablet 4 mg (4 mg Oral Given 09/05/15 2031)  ondansetron (ZOFRAN-ODT) disintegrating tablet 4 mg (4 mg Oral Given 09/06/15 0004)  gi cocktail (Maalox,Lidocaine,Donnatal) (30 mLs Oral Given 09/06/15 0004)    DIAGNOSTIC STUDIES: Oxygen Saturation is 100% on RA, normal by my interpretation.    COORDINATION OF CARE: 11:52 PM Discussed treatment plan with pt at bedside which includes lab work and pt agreed to plan.   Labs Review Labs Reviewed  BASIC METABOLIC PANEL - Abnormal; Notable for the following:    CO2 21 (*)    Anion gap 16 (*)    All other components within normal limits  CBC - Abnormal; Notable for the following:    WBC 10.6 (*)    All other components within normal limits  LIPASE, BLOOD  I-STAT TROPOININ, ED    I have personally reviewed and evaluated these lab results as part of my medical decision-making.   EKG Interpretation   Date/Time:  Saturday September 05 2015 20:29:39 EDT Ventricular Rate:  134 PR Interval:  114 QRS  Duration: 78 QT Interval:  312 QTC Calculation: 465 R Axis:   77 Text Interpretation:  Sinus tachycardia Biatrial enlargement Abnormal ECG  No significant change since last tracing Confirmed by Christy Gentles  MD, Elenore Rota  [phone]) on 09/05/2015 11:25:48 PM     Medications  ondansetron (ZOFRAN-ODT) disintegrating tablet 4 mg (4 mg Oral Given 09/05/15 2031)  ondansetron (ZOFRAN-ODT) disintegrating tablet 4 mg (4 mg Oral Given 09/06/15 0004)  gi cocktail (Maalox,Lidocaine,Donnatal) (30 mLs Oral Given 09/06/15 0004)   Pt with h/o recurrent ED visits for abdominal pain and h/o panreatitis Multiple CT scans previously Will defer further workup Pt stable He is taking PO Advised PCP followup Discussed need to cut back on ETOH HR improved after monitoring in the ED  MDM   Final diagnoses:  Abdominal pain, unspecified abdominal location    Nursing notes including past medical history and social history reviewed and considered in documentation Labs/vital reviewed myself and considered during evaluation   I personally performed the services described in this documentation, which was scribed in my presence. The recorded information has been reviewed and is accurate.       Ripley Fraise, MD 09/06/15 [phone]

## 2015-09-06 IMAGING — CT CT HEAD W/O CM
1 series · 16 of 30 positions shown, 20 images · non-contrast
Comparison: 10/02/2012

CLINICAL DATA: Right temporal headache, hypertension

EXAM:
CT HEAD WITHOUT CONTRAST
TECHNIQUE: Contiguous axial images were obtained from the base of the skull
through the vertex without intravenous contrast.

[Series 2: head 4.8 h37s · axial · 0.45mm/px · z∈[+1140,+1273]mm · 16 of 32 slices shown, 20 images]
[im 2/32  brain]
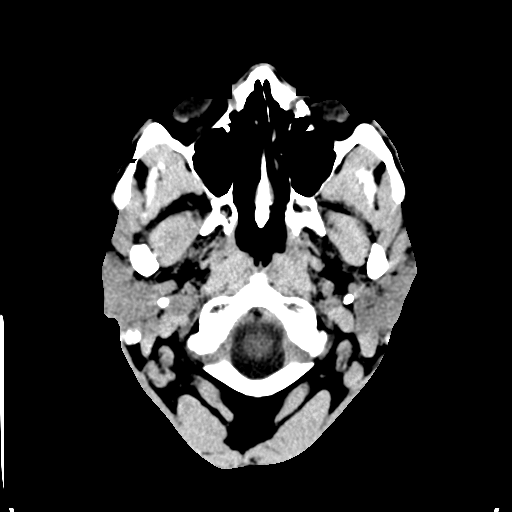
[im 2/32  bone]
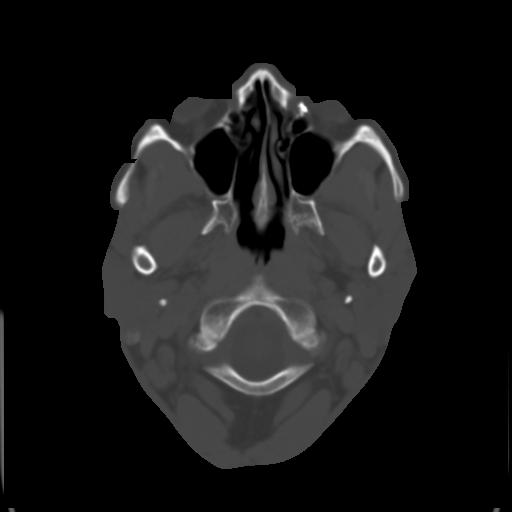
[im 4/32  brain]
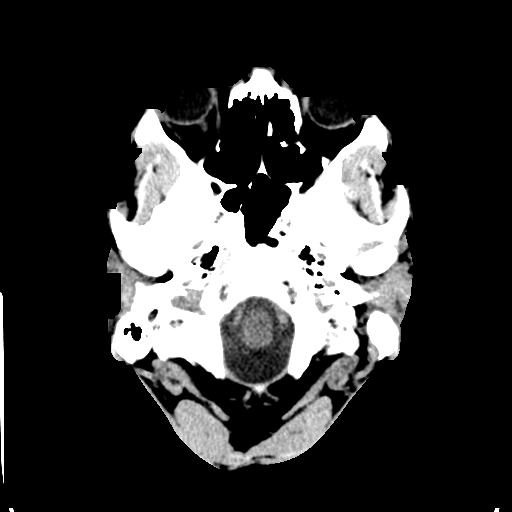
[im 6/32  brain]
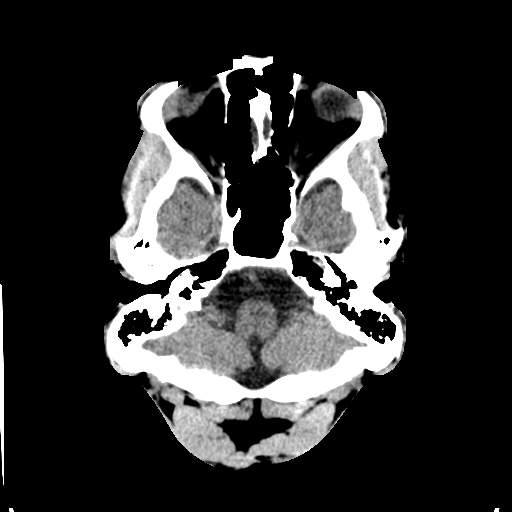
[im 8/32  brain]
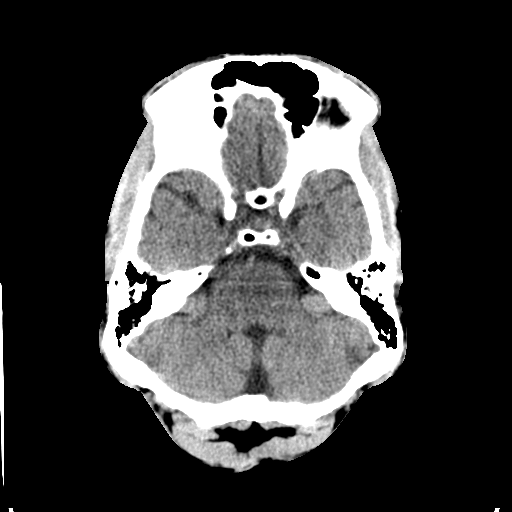
[im 9/32  brain]
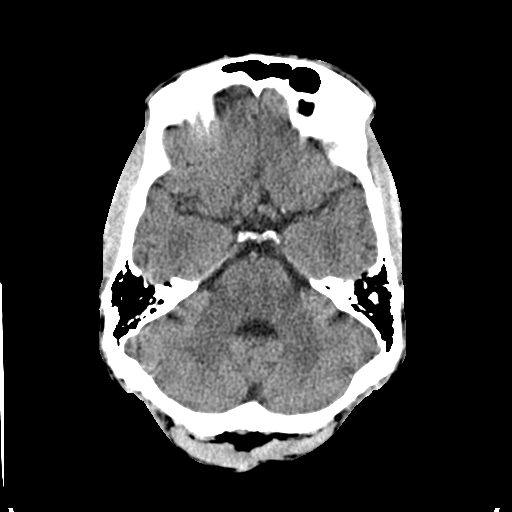
[im 9/32  bone]
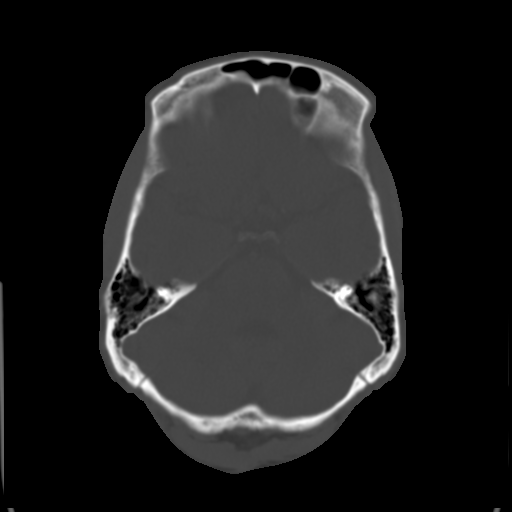
[im 11/32  brain]
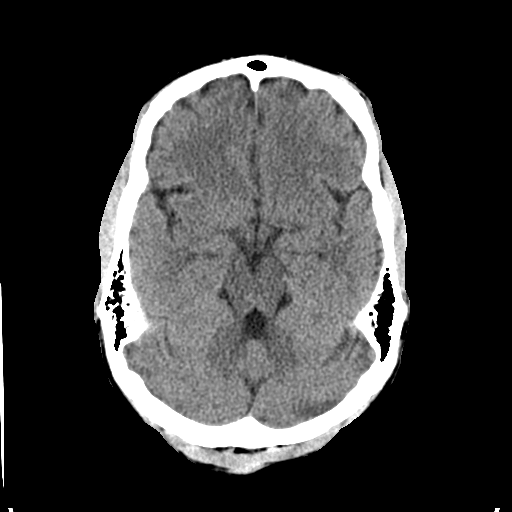
[im 13/32  brain]
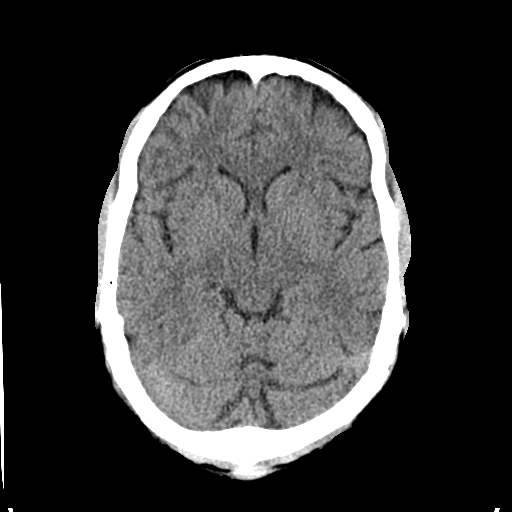
[im 15/32  brain]
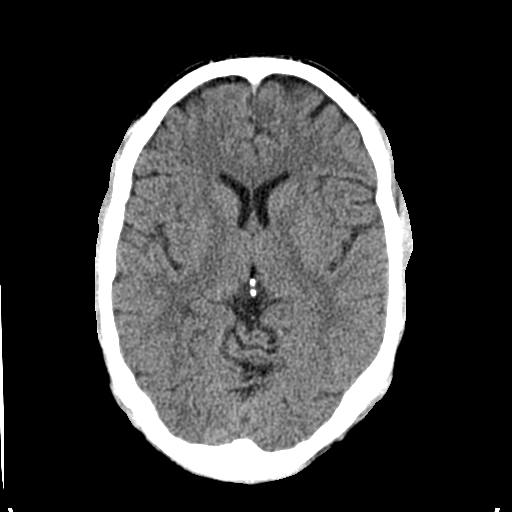
[im 17/32  brain]
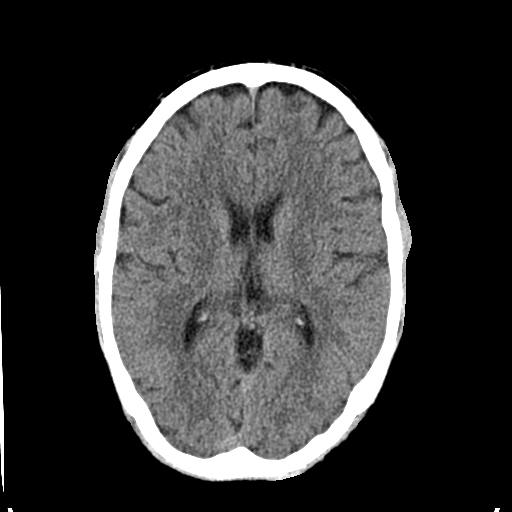
[im 17/32  bone]
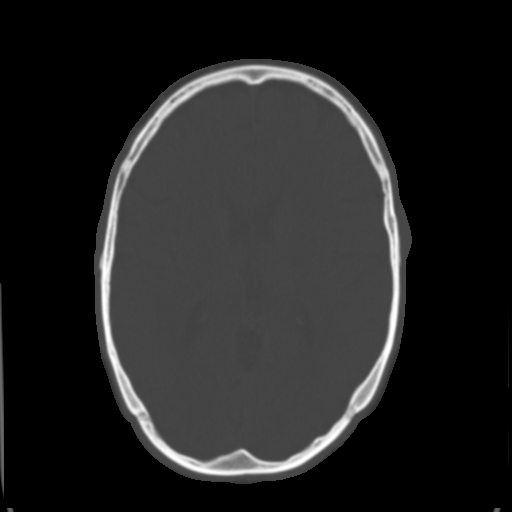
[im 19/32  brain]
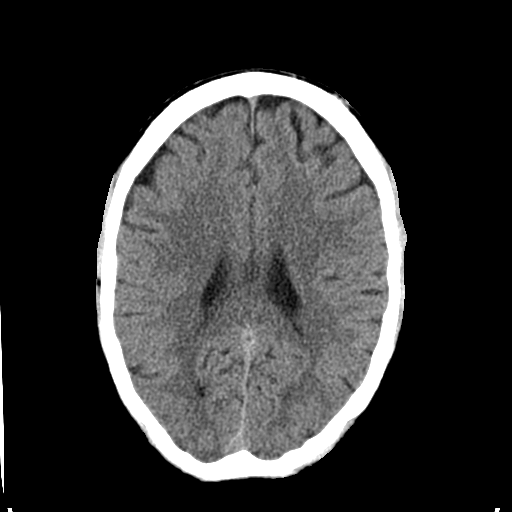
[im 21/32  brain]
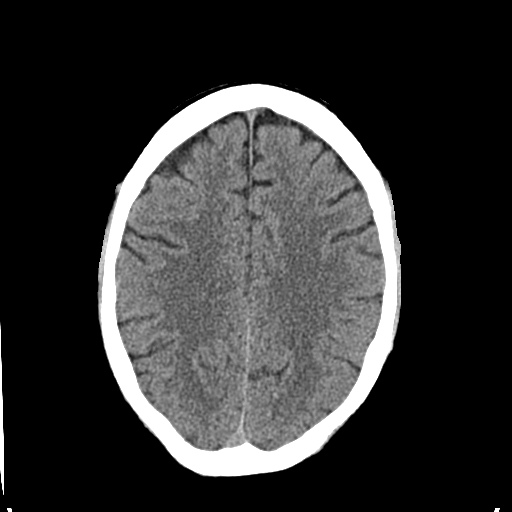
[im 23/32  brain]
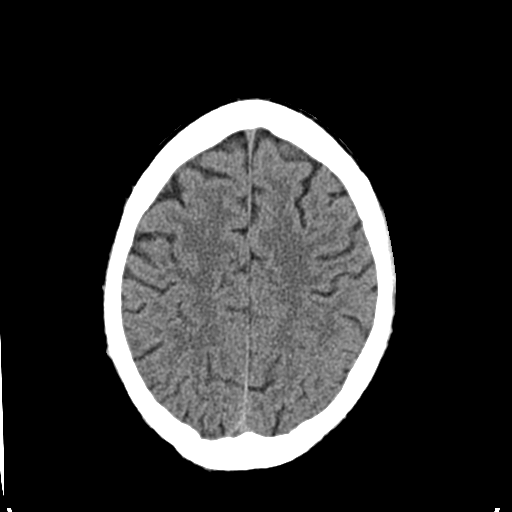
[im 24/32  brain]
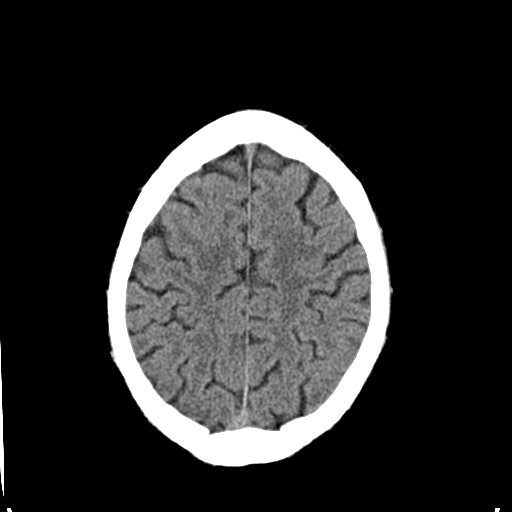
[im 24/32  bone]
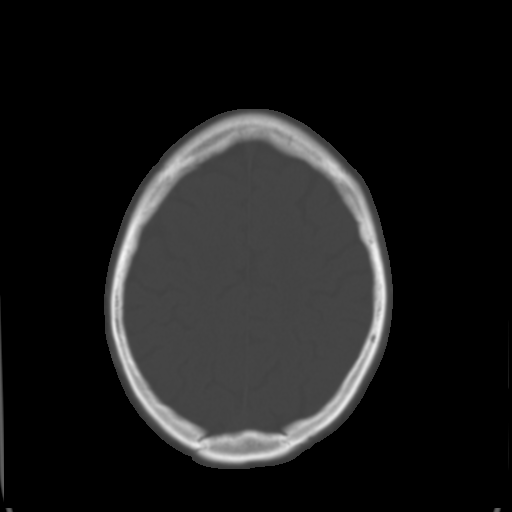
[im 26/32  brain]
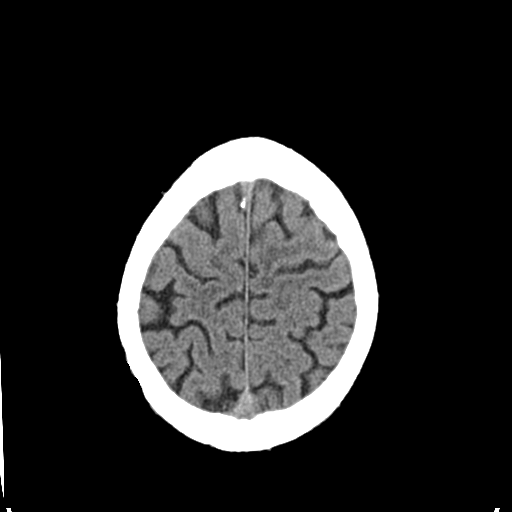
[im 28/32  brain]
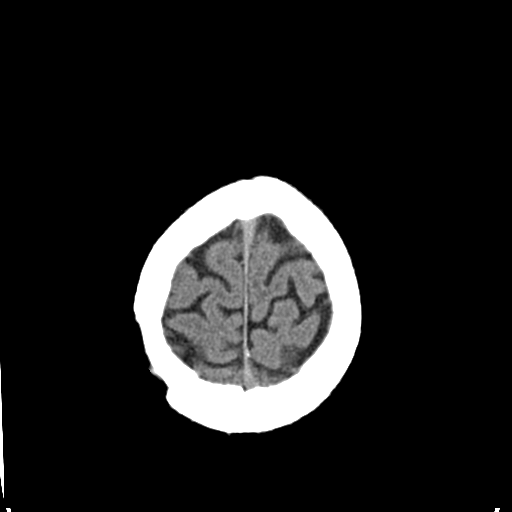
[im 30/32  brain]
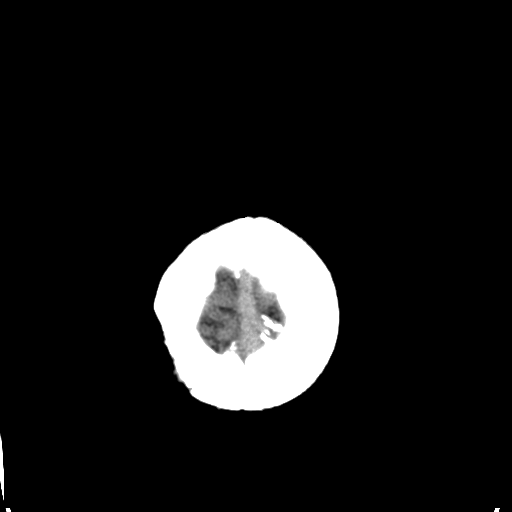

[16 of 30 positions shown; findings below may reference images not displayed]

FINDINGS: No evidence of parenchymal hemorrhage or extra-axial fluid
collection. No mass lesion, mass effect, or midline shift.

No CT evidence of acute infarction.

Cerebral volume is within normal limits.  No ventriculomegaly.

The visualized paranasal sinuses are essentially clear. The mastoid
air cells are unopacified.

No evidence of calvarial fracture.
IMPRESSION: No evidence of acute intracranial abnormality.

## 2015-09-06 MED ORDER — ONDANSETRON 8 MG PO TBDP: ORAL_TABLET | ORAL | Status: DC

## 2015-09-06 NOTE — Discharge Instructions (Signed)
°  SEEK IMMEDIATE MEDICAL ATTENTION IF: °The pain does not go away or becomes severe, particularly over the next 8-12 hours.  °A temperature above 100.4F develops.  °Repeated vomiting occurs (multiple episodes).  °The pain becomes localized to portions of the abdomen. The right side could possibly be appendicitis. In an adult, the left lower portion of the abdomen could be colitis or diverticulitis.  °Blood is being passed in stools or vomit (bright red or black tarry stools).  °Return also if you develop chest pain, difficulty breathing, dizziness or fainting, or become confused, poorly responsive, or inconsolable. ° °

## 2015-09-06 NOTE — ED Notes (Signed)
Pt A&Ox4, ambulatory at d/c with steady gait, NAD and states he has all of his belongings with him at d/c

## 2015-09-07 ENCOUNTER — Encounter (HOSPITAL_COMMUNITY): Payer: Self-pay | Admitting: Adult Health

## 2015-09-07 DIAGNOSIS — Z862 Personal history of diseases of the blood and blood-forming organs and certain disorders involving the immune mechanism: Secondary | ICD-10-CM | POA: Insufficient documentation

## 2015-09-07 DIAGNOSIS — F419 Anxiety disorder, unspecified: Secondary | ICD-10-CM | POA: Insufficient documentation

## 2015-09-07 DIAGNOSIS — J45909 Unspecified asthma, uncomplicated: Secondary | ICD-10-CM | POA: Insufficient documentation

## 2015-09-07 DIAGNOSIS — F4310 Post-traumatic stress disorder, unspecified: Secondary | ICD-10-CM | POA: Insufficient documentation

## 2015-09-07 DIAGNOSIS — F17210 Nicotine dependence, cigarettes, uncomplicated: Secondary | ICD-10-CM | POA: Insufficient documentation

## 2015-09-07 DIAGNOSIS — R109 Unspecified abdominal pain: Secondary | ICD-10-CM | POA: Insufficient documentation

## 2015-09-07 DIAGNOSIS — R112 Nausea with vomiting, unspecified: Secondary | ICD-10-CM | POA: Insufficient documentation

## 2015-09-07 DIAGNOSIS — I10 Essential (primary) hypertension: Secondary | ICD-10-CM | POA: Insufficient documentation

## 2015-09-07 DIAGNOSIS — Z8701 Personal history of pneumonia (recurrent): Secondary | ICD-10-CM | POA: Insufficient documentation

## 2015-09-07 DIAGNOSIS — Z8739 Personal history of other diseases of the musculoskeletal system and connective tissue: Secondary | ICD-10-CM | POA: Insufficient documentation

## 2015-09-07 DIAGNOSIS — K219 Gastro-esophageal reflux disease without esophagitis: Secondary | ICD-10-CM | POA: Insufficient documentation

## 2015-09-07 DIAGNOSIS — G8929 Other chronic pain: Secondary | ICD-10-CM | POA: Insufficient documentation

## 2015-09-07 DIAGNOSIS — G43909 Migraine, unspecified, not intractable, without status migrainosus: Secondary | ICD-10-CM | POA: Insufficient documentation

## 2015-09-07 DIAGNOSIS — Z79899 Other long term (current) drug therapy: Secondary | ICD-10-CM | POA: Insufficient documentation

## 2015-09-07 DIAGNOSIS — R079 Chest pain, unspecified: Secondary | ICD-10-CM | POA: Insufficient documentation

## 2015-09-07 DIAGNOSIS — F319 Bipolar disorder, unspecified: Secondary | ICD-10-CM | POA: Insufficient documentation

## 2015-09-07 LAB — URINALYSIS, ROUTINE W REFLEX MICROSCOPIC
Bilirubin Urine: NEGATIVE
GLUCOSE, UA: NEGATIVE mg/dL
Hgb urine dipstick: NEGATIVE
KETONES UR: NEGATIVE mg/dL
LEUKOCYTES UA: NEGATIVE
Nitrite: NEGATIVE
PH: 7 (ref 5.0–8.0)
Protein, ur: NEGATIVE mg/dL
SPECIFIC GRAVITY, URINE: 1.015 (ref 1.005–1.030)

## 2015-09-07 LAB — CBC
HEMATOCRIT: 38.5 % — AB (ref 39.0–52.0)
Hemoglobin: 13 g/dL (ref 13.0–17.0)
MCH: 31.1 pg (ref 26.0–34.0)
MCHC: 33.8 g/dL (ref 30.0–36.0)
MCV: 92.1 fL (ref 78.0–100.0)
Platelets: 232 10*3/uL (ref 150–400)
RBC: 4.18 MIL/uL — ABNORMAL LOW (ref 4.22–5.81)
RDW: 14 % (ref 11.5–15.5)
WBC: 11.7 10*3/uL — AB (ref 4.0–10.5)

## 2015-09-07 MED ORDER — ONDANSETRON 4 MG PO TBDP
4.0000 mg | ORAL_TABLET | Freq: Once | ORAL | Status: AC | PRN
  Administered 2015-09-07: 4 mg via ORAL

## 2015-09-07 MED ORDER — ONDANSETRON 4 MG PO TBDP
ORAL_TABLET | ORAL | Status: AC
  Filled 2015-09-07: qty 1

## 2015-09-07 NOTE — ED Notes (Signed)
Presents with 3 hours of abdominal pain-endorses drinking alcohol to "try to make the pain go away" endorses a few beers. C/o nausea.

## 2015-09-08 ENCOUNTER — Emergency Department (HOSPITAL_COMMUNITY)
Admission: EM | Admit: 2015-09-08 | Discharge: 2015-09-08 | Disposition: A | Discharge status: Home / Self Care | Payer: No Typology Code available for payment source | Attending: Emergency Medicine | Admitting: Emergency Medicine

## 2015-09-08 DIAGNOSIS — R109 Unspecified abdominal pain: Secondary | ICD-10-CM

## 2015-09-08 DIAGNOSIS — G8929 Other chronic pain: Secondary | ICD-10-CM

## 2015-09-08 LAB — COMPREHENSIVE METABOLIC PANEL
ALBUMIN: 4.3 g/dL (ref 3.5–5.0)
ALT: 94 U/L — ABNORMAL HIGH (ref 17–63)
ANION GAP: 17 — AB (ref 5–15)
AST: 85 U/L — ABNORMAL HIGH (ref 15–41)
Alkaline Phosphatase: 116 U/L (ref 38–126)
BUN: 14 mg/dL (ref 6–20)
CHLORIDE: 98 mmol/L — AB (ref 101–111)
CO2: 22 mmol/L (ref 22–32)
Calcium: 9.7 mg/dL (ref 8.9–10.3)
Creatinine, Ser: 0.73 mg/dL (ref 0.61–1.24)
GFR calc Af Amer: >60 mL/min (ref >60)
GFR calc non Af Amer: >60 mL/min (ref >60)
GLUCOSE: 96 mg/dL (ref 65–99)
POTASSIUM: 3.8 mmol/L (ref 3.5–5.1)
SODIUM: 137 mmol/L (ref 135–145)
Total Bilirubin: 1.1 mg/dL (ref 0.3–1.2)
Total Protein: 7.9 g/dL (ref 6.5–8.1)

## 2015-09-08 LAB — LIPASE, BLOOD: LIPASE: 24 U/L (ref 11–51)

## 2015-09-08 MED ORDER — ONDANSETRON 4 MG PO TBDP
8.0000 mg | ORAL_TABLET | Freq: Once | ORAL | Status: AC
  Administered 2015-09-08: 8 mg via ORAL
  Filled 2015-09-08: qty 2

## 2015-09-08 MED ORDER — GI COCKTAIL ~~LOC~~
30.0000 mL | Freq: Once | ORAL | Status: AC
  Administered 2015-09-08: 30 mL via ORAL
  Filled 2015-09-08: qty 30

## 2015-09-08 NOTE — Discharge Instructions (Signed)
Abdominal Pain, Adult °Many things can cause abdominal pain. Usually, abdominal pain is not caused by a disease and will improve without treatment. It can often be observed and treated at home. Your health care provider will do a physical exam and possibly order blood tests and X-rays to help determine the seriousness of your pain. However, in many cases, more time must pass before a clear cause of the pain can be found. Before that point, your health care provider may not know if you need more testing or further treatment. °HOME CARE INSTRUCTIONS °Monitor your abdominal pain for any changes. The following actions may help to alleviate any discomfort you are experiencing: °· Only take over-the-counter or prescription medicines as directed by your health care provider. °· Do not take laxatives unless directed to do so by your health care provider. °· Try a clear liquid diet (broth, tea, or water) as directed by your health care provider. Slowly move to a bland diet as tolerated. °SEEK MEDICAL CARE IF: °· You have unexplained abdominal pain. °· You have abdominal pain associated with nausea or diarrhea. °· You have pain when you urinate or have a bowel movement. °· You experience abdominal pain that wakes you in the night. °· You have abdominal pain that is worsened or improved by eating food. °· You have abdominal pain that is worsened with eating fatty foods. °· You have a fever. °SEEK IMMEDIATE MEDICAL CARE IF: °· Your pain does not go away within 2 hours. °· You keep throwing up (vomiting). °· Your pain is felt only in portions of the abdomen, such as the right side or the left lower portion of the abdomen. °· You pass bloody or black tarry stools. °MAKE SURE YOU: °· Understand these instructions. °· Will watch your condition. °· Will get help right away if you are not doing well or get worse. °  °This information is not intended to replace advice given to you by your health care provider. Make sure you discuss  any questions you have with your health care provider. °  °Document Released: 02/02/2005 Document Revised: 01/14/2015 Document Reviewed: 01/02/2013 °Elsevier Interactive Patient Education ©2016 [employer] °Chronic Pain °Chronic pain can be defined as pain that is off and on and lasts for 3-6 months or longer. Many things cause chronic pain, which can make it difficult to make a diagnosis. There are many treatment options available for chronic pain. However, finding a treatment that works well for you may require trying various approaches until the right one is found. Many people benefit from a combination of two or more types of treatment to control their pain. °SYMPTOMS  °Chronic pain can occur anywhere in the body and can range from mild to very severe. Some types of chronic pain include: °· Headache. °· Low back pain. °· Cancer pain. °· Arthritis pain. °· Neurogenic pain. This is pain resulting from damage to nerves. ° People with chronic pain may also have other symptoms such as: °· Depression. °· Anger. °· Insomnia. °· Anxiety. °DIAGNOSIS  °Your health care provider will help diagnose your condition over time. In many cases, the initial focus will be on excluding possible conditions that could be causing the pain. Depending on your symptoms, your health care provider may order tests to diagnose your condition. Some of these tests may include:  °· Blood tests.   °· CT scan.   °· MRI.   °· X-rays.   °· Ultrasounds.   °· Nerve conduction studies.   °You may need to see a specialist.  °  TREATMENT  °Finding treatment that works well may take time. You may be referred to a pain specialist. He or she may prescribe medicine or therapies, such as:  °· Mindful meditation or yoga. °· Shots (injections) of numbing or pain-relieving medicines into the spine or area of pain. °· Local electrical stimulation. °· Acupuncture.   °· Massage therapy.   °· Aroma, color, light, or sound therapy.   °· Biofeedback.   °· Working with  a physical therapist to keep from getting stiff.   °· Regular, gentle exercise.   °· Cognitive or behavioral therapy.   °· Group support.   °Sometimes, surgery may be recommended.  °HOME CARE INSTRUCTIONS  °· Take all medicines as directed by your health care provider.   °· Lessen stress in your life by relaxing and doing things such as listening to calming music.   °· Exercise or be active as directed by your health care provider.   °· Eat a healthy diet and include things such as vegetables, fruits, fish, and lean meats in your diet.   °· Keep all follow-up appointments with your health care provider.   °· Attend a support group with others suffering from chronic pain. °SEEK MEDICAL CARE IF:  °· Your pain gets worse.   °· You develop a new pain that was not there before.   °· You cannot tolerate medicines given to you by your health care provider.   °· You have new symptoms since your last visit with your health care provider.   °SEEK IMMEDIATE MEDICAL CARE IF:  °· You feel weak.   °· You have decreased sensation or numbness.   °· You lose control of bowel or bladder function.   °· Your pain suddenly gets much worse.   °· You develop shaking. °· You develop chills. °· You develop confusion. °· You develop chest pain. °· You develop shortness of breath.   °MAKE SURE YOU: °· Understand these instructions. °· Will watch your condition. °· Will get help right away if you are not doing well or get worse. °  °This information is not intended to replace advice given to you by your health care provider. Make sure you discuss any questions you have with your health care provider. °  °Document Released: 01/15/2002 Document Revised: 12/26/2012 Document Reviewed: 10/19/2012 °Elsevier Interactive Patient Education ©2016 [employer] ° °

## 2015-09-08 NOTE — ED Provider Notes (Signed)
CSN: [id number]     Arrival date & time 09/07/15  2332 History   First MD Initiated Contact with Patient 09/08/15 0423     Chief Complaint  Patient presents with  . Abdominal Pain     (Consider location/radiation/quality/duration/timing/severity/associated sxs/prior Treatment) HPI Comments: Patient well known to the ED presents with abdominal pain and vomiting since yesterday. The patient has a history of chronic abdominal pain, chronic pancreatitis, cocaine abuse, alcoholism, GERD, WPW. No fever currently. He reports he is out of his medications for nausea at home but they are at the drug store to be picked up tomorrow. After arrival to the ED, he reports onset left sided chest pain similar to previous episodes of chest pain. No SOB.    Patient is a 45 y.o. male presenting with abdominal pain. The history is provided by the patient. No language interpreter was used.  Abdominal Pain Pain location:  LUQ Associated symptoms: chest pain, nausea and vomiting   Associated symptoms: no chills and no fever     Past Medical History  Diagnosis Date  . Hypertension   . Asthma   . Pancreatitis   . Cocaine abuse   . Depression   . H/O suicide attempt 10/2012  . Heart murmur     "when he was little" (03/06/2013)  . Anemia   . H/O hiatal hernia   . GERD (gastroesophageal reflux disease)   . Anxiety   . WPW (Wolff-Parkinson-White syndrome)     [name] 03/06/2013  . High cholesterol   . Femoral condyle fracture ([place]) 03/08/2014    left medial/notes 03/09/2014  . Alcoholism /alcohol abuse ([place])   . Family history of adverse reaction to anesthesia     "grandmother gets confused"  . Shortness of breath     "can happen at anytime" (03/06/2013)  . Pneumonia 1990's X 3  . Chronic bronchitis ([place])   . Sickle cell trait ([place])   . History of blood transfusion 10/2012    "when I tried to commit suicide"  . History of stomach ulcers   . Migraine     "a few times/year" (03/26/2015)  . Arthritis     "knees; arms; elbows" (03/26/2015)  . Chronic lower back pain   . Bipolar disorder ([place])   . PTSD (post-traumatic stress disorder)   . Marijuana abuse, continuous    Past Surgical History  Procedure Laterality Date  . Facial fracture surgery Left 1990's    "result of trauma"   . Eye surgery Left 1990's    "result of trauma"   . Left heart catheterization with coronary angiogram Right 03/07/2013    Procedure: LEFT HEART CATHETERIZATION WITH CORONARY ANGIOGRAM;  Surgeon: [name], MD;  Location: [facility];  Service: Cardiovascular;  Laterality: Right;  . Cardiac catheterization    . Fracture surgery    . Umbilical hernia repair    . Hernia repair     Family History  Problem Relation Age of Onset  . Hypertension Other   . Coronary artery disease Other    Social History  Substance Use Topics  . Smoking status: Current Every Day Smoker -- 1.00 packs/day for 30 years    Types: Cigarettes  . Smokeless tobacco: Current User    Types: Chew  . Alcohol Use: 37.8 oz/week    63 Cans of beer per week     Comment: last drink was 06/18/15    Review of Systems  Constitutional: Negative for fever and chills.  HENT: Negative.  Respiratory: Negative.   Cardiovascular: Positive for chest pain.  Gastrointestinal: Positive for nausea, vomiting and abdominal pain.  Musculoskeletal: Negative.   Skin: Negative.   Neurological: Negative.       Allergies  Shellfish-derived products and Trazodone and nefazodone  Home Medications   Prior to Admission medications   Medication Sig Start Date End Date Taking? Authorizing Provider  albuterol (PROVENTIL HFA;VENTOLIN HFA) 108 (90 BASE) MCG/ACT inhaler Inhale 2 puffs into the lungs every 6 (six) hours as needed for wheezing or shortness of breath (wheezing).     Historical Provider, MD  lipase/protease/amylase (CREON) 12000 UNITS CPEP capsule Take 2 capsules (24,000 Units total) by mouth 3 (three) times daily with meals. 01/15/15    [name], MD  metoprolol tartrate (LOPRESSOR) 25 MG tablet Take 1 tablet (25 mg total) by mouth 2 (two) times daily. 01/15/15   [name], MD  ondansetron (ZOFRAN ODT) 8 MG disintegrating tablet 8mg  ODT q4 hours prn nausea 09/06/15   Ripley Fraise, MD  oxyCODONE-acetaminophen (PERCOCET/ROXICET) 5-325 MG tablet Take 1 tablet by mouth every 6 (six) hours as needed for severe pain. 08/26/15   Dalia Heading, PA-C  promethazine (PHENERGAN) 25 MG tablet Take 1 tablet (25 mg total) by mouth every 8 (eight) hours as needed for nausea or vomiting. 08/26/15   [name], PA-C  QUEtiapine (SEROQUEL) 50 MG tablet Take 100 mg by mouth at bedtime. 01/15/15   Historical Provider, MD  ranitidine (ZANTAC) 150 MG tablet Take 1 tablet (150 mg total) by mouth 2 (two) times daily. 08/24/15   [name], MD  sertraline (ZOLOFT) 25 MG tablet Take 75 mg by mouth daily.    Historical Provider, MD  sildenafil (VIAGRA) 50 MG tablet Take 1 tablet (50 mg total) by mouth daily as needed for erectile dysfunction. 03/31/15   [name], MD   BP 138/98 mmHg  Pulse 126  Temp(Src) 98.2 F (36.8 C) (Oral)  Resp 20  Ht 5\' 9"  (1.753 m)  Wt 60.47 kg  BMI 19.68 kg/m2  SpO2 100% Physical Exam  Constitutional: He is oriented to person, place, and time. He appears well-developed and well-nourished.  HENT:  Head: Normocephalic.  Neck: Normal range of motion. Neck supple.  Cardiovascular: Normal rate and regular rhythm.   No murmur heard. Pulmonary/Chest: Effort normal and breath sounds normal. He has no wheezes. He has no rales. He exhibits no tenderness.  Abdominal: Soft. Bowel sounds are normal. There is tenderness. There is no rebound and no guarding.  Musculoskeletal: Normal range of motion.  Neurological: He is alert and oriented to person, place, and time.  Skin: Skin is warm and dry. No rash noted.  Psychiatric: He has a normal mood and affect.    ED Course  Procedures  (including critical care time) Labs Review Labs Reviewed  COMPREHENSIVE METABOLIC PANEL - Abnormal; Notable for the following:    Chloride 98 (*)    AST 85 (*)    ALT 94 (*)    Anion gap 17 (*)    All other components within normal limits  CBC - Abnormal; Notable for the following:    WBC 11.7 (*)    RBC 4.18 (*)    HCT 38.5 (*)    All other components within normal limits  LIPASE, BLOOD  URINALYSIS, ROUTINE W REFLEX MICROSCOPIC (NOT AT [facility])   Results for orders placed or performed during the hospital encounter of 09/08/15  Lipase, blood  Result Value Ref Range   Lipase  24 11 - 51 U/L  Comprehensive metabolic panel  Result Value Ref Range   Sodium 137 135 - 145 mmol/L   Potassium 3.8 3.5 - 5.1 mmol/L   Chloride 98 (L) 101 - 111 mmol/L   CO2 22 22 - 32 mmol/L   Glucose, Bld 96 65 - 99 mg/dL   BUN 14 6 - 20 mg/dL   Creatinine, Ser 0.73 0.61 - 1.24 mg/dL   Calcium 9.7 8.9 - 10.3 mg/dL   Total Protein 7.9 6.5 - 8.1 g/dL   Albumin 4.3 3.5 - 5.0 g/dL   AST 85 (H) 15 - 41 U/L   ALT 94 (H) 17 - 63 U/L   Alkaline Phosphatase 116 38 - 126 U/L   Total Bilirubin 1.1 0.3 - 1.2 mg/dL   GFR calc non Af Amer >60 >60 mL/min   GFR calc Af Amer >60 >60 mL/min   Anion gap 17 (H) 5 - 15  CBC  Result Value Ref Range   WBC 11.7 (H) 4.0 - 10.5 K/uL   RBC 4.18 (L) 4.22 - 5.81 MIL/uL   Hemoglobin 13.0 13.0 - 17.0 g/dL   HCT 38.5 (L) 39.0 - 52.0 %   MCV 92.1 78.0 - 100.0 fL   MCH 31.1 26.0 - 34.0 pg   MCHC 33.8 30.0 - 36.0 g/dL   RDW 14.0 11.5 - 15.5 %   Platelets 232 150 - 400 K/uL  Urinalysis, Routine w reflex microscopic  Result Value Ref Range   Color, Urine YELLOW YELLOW   APPearance CLEAR CLEAR   Specific Gravity, Urine 1.015 1.005 - 1.030   pH 7.0 5.0 - 8.0   Glucose, UA NEGATIVE NEGATIVE mg/dL   Hgb urine dipstick NEGATIVE NEGATIVE   Bilirubin Urine NEGATIVE NEGATIVE   Ketones, ur NEGATIVE NEGATIVE mg/dL   Protein, ur NEGATIVE NEGATIVE mg/dL   Nitrite NEGATIVE NEGATIVE    Leukocytes, UA NEGATIVE NEGATIVE     Imaging Review No results found. I have personally reviewed and evaluated these images and lab results as part of my medical decision-making.   EKG Interpretation   Date/Time:  Tuesday Sep 08 2015 05:10:48 EDT Ventricular Rate:  121 PR Interval:  110 QRS Duration: 96 QT Interval:  309 QTC Calculation: 438 R Axis:   76 Text Interpretation:  Sinus tachycardia Consider right atrial enlargement  No significant change since last tracing Confirmed by LITTLE MD, RACHEL  [id number]) on 09/08/2015 5:15:09 AM      MDM   Final diagnoses:  None    1. Chronic abdominal pain  The patient is not vomiting in the ED. He is tachycardic but is drinking fluids. Sleeping on final re-evaluation. Care Plan reviewed. Patient can be discharged and in encouraged to follow up with his doctor for further management.     Charlann Lange, PA-C 09/08/15 [place], MD 09/08/15 [phone]

## 2015-09-12 ENCOUNTER — Encounter (HOSPITAL_COMMUNITY): Payer: Self-pay | Admitting: *Deleted

## 2015-09-12 ENCOUNTER — Emergency Department (HOSPITAL_COMMUNITY)
Admission: EM | Admit: 2015-09-12 | Discharge: 2015-09-12 | Disposition: A | Discharge status: Home / Self Care | Payer: No Typology Code available for payment source | Attending: Emergency Medicine | Admitting: Emergency Medicine

## 2015-09-12 DIAGNOSIS — Z79899 Other long term (current) drug therapy: Secondary | ICD-10-CM | POA: Insufficient documentation

## 2015-09-12 DIAGNOSIS — F10929 Alcohol use, unspecified with intoxication, unspecified: Secondary | ICD-10-CM | POA: Insufficient documentation

## 2015-09-12 DIAGNOSIS — K219 Gastro-esophageal reflux disease without esophagitis: Secondary | ICD-10-CM | POA: Insufficient documentation

## 2015-09-12 DIAGNOSIS — I10 Essential (primary) hypertension: Secondary | ICD-10-CM | POA: Insufficient documentation

## 2015-09-12 DIAGNOSIS — Z9889 Other specified postprocedural states: Secondary | ICD-10-CM | POA: Insufficient documentation

## 2015-09-12 DIAGNOSIS — F4310 Post-traumatic stress disorder, unspecified: Secondary | ICD-10-CM | POA: Insufficient documentation

## 2015-09-12 DIAGNOSIS — M1990 Unspecified osteoarthritis, unspecified site: Secondary | ICD-10-CM | POA: Insufficient documentation

## 2015-09-12 DIAGNOSIS — R011 Cardiac murmur, unspecified: Secondary | ICD-10-CM | POA: Insufficient documentation

## 2015-09-12 DIAGNOSIS — F319 Bipolar disorder, unspecified: Secondary | ICD-10-CM | POA: Insufficient documentation

## 2015-09-12 DIAGNOSIS — Z8781 Personal history of (healed) traumatic fracture: Secondary | ICD-10-CM | POA: Insufficient documentation

## 2015-09-12 DIAGNOSIS — R109 Unspecified abdominal pain: Secondary | ICD-10-CM

## 2015-09-12 DIAGNOSIS — G43909 Migraine, unspecified, not intractable, without status migrainosus: Secondary | ICD-10-CM | POA: Insufficient documentation

## 2015-09-12 DIAGNOSIS — Z915 Personal history of self-harm: Secondary | ICD-10-CM | POA: Insufficient documentation

## 2015-09-12 DIAGNOSIS — G8929 Other chronic pain: Secondary | ICD-10-CM | POA: Insufficient documentation

## 2015-09-12 DIAGNOSIS — F17210 Nicotine dependence, cigarettes, uncomplicated: Secondary | ICD-10-CM | POA: Insufficient documentation

## 2015-09-12 DIAGNOSIS — Z8701 Personal history of pneumonia (recurrent): Secondary | ICD-10-CM | POA: Insufficient documentation

## 2015-09-12 DIAGNOSIS — F419 Anxiety disorder, unspecified: Secondary | ICD-10-CM | POA: Insufficient documentation

## 2015-09-12 DIAGNOSIS — Z862 Personal history of diseases of the blood and blood-forming organs and certain disorders involving the immune mechanism: Secondary | ICD-10-CM | POA: Insufficient documentation

## 2015-09-12 DIAGNOSIS — K860 Alcohol-induced chronic pancreatitis: Secondary | ICD-10-CM | POA: Insufficient documentation

## 2015-09-12 DIAGNOSIS — K852 Alcohol induced acute pancreatitis without necrosis or infection: Secondary | ICD-10-CM | POA: Insufficient documentation

## 2015-09-12 DIAGNOSIS — J45909 Unspecified asthma, uncomplicated: Secondary | ICD-10-CM | POA: Insufficient documentation

## 2015-09-12 DIAGNOSIS — Z8639 Personal history of other endocrine, nutritional and metabolic disease: Secondary | ICD-10-CM | POA: Insufficient documentation

## 2015-09-12 LAB — COMPREHENSIVE METABOLIC PANEL
ALBUMIN: 3.9 g/dL (ref 3.5–5.0)
ALK PHOS: 106 U/L (ref 38–126)
ALT: 71 U/L — AB (ref 17–63)
ALT: 60 U/L (ref 17–63)
ANION GAP: 15 (ref 5–15)
AST: 82 U/L — ABNORMAL HIGH (ref 15–41)
AST: 56 U/L — AB (ref 15–41)
Albumin: 3.5 g/dL (ref 3.5–5.0)
Alkaline Phosphatase: 90 U/L (ref 38–126)
Anion gap: 10 (ref 5–15)
BILIRUBIN TOTAL: 0.8 mg/dL (ref 0.3–1.2)
BILIRUBIN TOTAL: 1.2 mg/dL (ref 0.3–1.2)
BUN: 7 mg/dL (ref 6–20)
CALCIUM: 9.1 mg/dL (ref 8.9–10.3)
CALCIUM: 8.9 mg/dL (ref 8.9–10.3)
CO2: 20 mmol/L — AB (ref 22–32)
CO2: 23 mmol/L (ref 22–32)
CREATININE: 0.76 mg/dL (ref 0.61–1.24)
CREATININE: 0.69 mg/dL (ref 0.61–1.24)
Chloride: 103 mmol/L (ref 101–111)
Chloride: 107 mmol/L (ref 101–111)
GFR calc Af Amer: >60 mL/min (ref >60)
GFR calc Af Amer: >60 mL/min (ref >60)
GFR calc non Af Amer: >60 mL/min (ref >60)
GFR calc non Af Amer: >60 mL/min (ref >60)
GLUCOSE: 86 mg/dL (ref 65–99)
GLUCOSE: 74 mg/dL (ref 65–99)
Potassium: 4.2 mmol/L (ref 3.5–5.1)
Potassium: 3.5 mmol/L (ref 3.5–5.1)
Sodium: 138 mmol/L (ref 135–145)
Sodium: 140 mmol/L (ref 135–145)
TOTAL PROTEIN: 7.3 g/dL (ref 6.5–8.1)
TOTAL PROTEIN: 6.6 g/dL (ref 6.5–8.1)

## 2015-09-12 LAB — CBC
HCT: 35.6 % — ABNORMAL LOW (ref 39.0–52.0)
HCT: 33.5 % — ABNORMAL LOW (ref 39.0–52.0)
HEMOGLOBIN: 12.1 g/dL — AB (ref 13.0–17.0)
Hemoglobin: 11.1 g/dL — ABNORMAL LOW (ref 13.0–17.0)
MCH: 32.2 pg (ref 26.0–34.0)
MCH: 30.8 pg (ref 26.0–34.0)
MCHC: 34 g/dL (ref 30.0–36.0)
MCHC: 33.1 g/dL (ref 30.0–36.0)
MCV: 94.7 fL (ref 78.0–100.0)
MCV: 93.1 fL (ref 78.0–100.0)
PLATELETS: 261 10*3/uL (ref 150–400)
PLATELETS: 239 10*3/uL (ref 150–400)
RBC: 3.76 MIL/uL — ABNORMAL LOW (ref 4.22–5.81)
RBC: 3.6 MIL/uL — ABNORMAL LOW (ref 4.22–5.81)
RDW: 14.4 % (ref 11.5–15.5)
RDW: 14.2 % (ref 11.5–15.5)
WBC: 8.3 10*3/uL (ref 4.0–10.5)
WBC: 9.4 10*3/uL (ref 4.0–10.5)

## 2015-09-12 LAB — URINALYSIS, ROUTINE W REFLEX MICROSCOPIC
BILIRUBIN URINE: NEGATIVE
BILIRUBIN URINE: NEGATIVE
Glucose, UA: NEGATIVE mg/dL
Glucose, UA: NEGATIVE mg/dL
HGB URINE DIPSTICK: NEGATIVE
HGB URINE DIPSTICK: NEGATIVE
KETONES UR: NEGATIVE mg/dL
KETONES UR: NEGATIVE mg/dL
Leukocytes, UA: NEGATIVE
Leukocytes, UA: NEGATIVE
NITRITE: NEGATIVE
NITRITE: NEGATIVE
PROTEIN: NEGATIVE mg/dL
PROTEIN: NEGATIVE mg/dL
SPECIFIC GRAVITY, URINE: 1.018 (ref 1.005–1.030)
SPECIFIC GRAVITY, URINE: 1.01 (ref 1.005–1.030)
pH: 5.5 (ref 5.0–8.0)
pH: 6 (ref 5.0–8.0)

## 2015-09-12 LAB — LIPASE, BLOOD
Lipase: 151 U/L — ABNORMAL HIGH (ref 11–51)
Lipase: 341 U/L — ABNORMAL HIGH (ref 11–51)

## 2015-09-12 LAB — ETHANOL: ALCOHOL ETHYL (B): 11 mg/dL — AB (ref <5)

## 2015-09-12 MED ORDER — ONDANSETRON HCL 4 MG/2ML IJ SOLN
4.0000 mg | Freq: Once | INTRAMUSCULAR | Status: AC
  Administered 2015-09-12: 4 mg via INTRAVENOUS
  Filled 2015-09-12: qty 2

## 2015-09-12 MED ORDER — KETOROLAC TROMETHAMINE 30 MG/ML IJ SOLN
30.0000 mg | Freq: Once | INTRAMUSCULAR | Status: AC
  Administered 2015-09-12: 30 mg via INTRAVENOUS
  Filled 2015-09-12: qty 1

## 2015-09-12 MED ORDER — SODIUM CHLORIDE 0.9 % IV BOLUS (SEPSIS)
1000.0000 mL | Freq: Once | INTRAVENOUS | Status: AC
  Administered 2015-09-12: 1000 mL via INTRAVENOUS

## 2015-09-12 MED ORDER — ONDANSETRON 4 MG PO TBDP: 4.0000 mg | ORAL_TABLET | Freq: Three times a day (TID) | ORAL | Status: DC | PRN

## 2015-09-12 MED ORDER — GI COCKTAIL ~~LOC~~
30.0000 mL | Freq: Once | ORAL | Status: AC
  Administered 2015-09-12: 30 mL via ORAL
  Filled 2015-09-12: qty 30

## 2015-09-12 MED ORDER — FENTANYL CITRATE (PF) 100 MCG/2ML IJ SOLN
100.0000 ug | Freq: Once | INTRAMUSCULAR | Status: AC
  Administered 2015-09-12: 100 ug via INTRAVENOUS
  Filled 2015-09-12: qty 2

## 2015-09-12 MED ORDER — FENTANYL CITRATE (PF) 100 MCG/2ML IJ SOLN
50.0000 ug | Freq: Once | INTRAMUSCULAR | Status: AC
  Administered 2015-09-12: 05:00:00 via INTRAVENOUS
  Filled 2015-09-12: qty 2

## 2015-09-12 MED ORDER — KETOROLAC TROMETHAMINE 15 MG/ML IJ SOLN
15.0000 mg | Freq: Once | INTRAMUSCULAR | Status: AC
  Administered 2015-09-12: 15 mg via INTRAVENOUS
  Filled 2015-09-12: qty 1

## 2015-09-12 MED ORDER — SODIUM CHLORIDE 0.9 % IV BOLUS (SEPSIS)
2000.0000 mL | Freq: Once | INTRAVENOUS | Status: AC
  Administered 2015-09-12: 2000 mL via INTRAVENOUS

## 2015-09-12 NOTE — ED Notes (Signed)
The pt is c/o abd pain for several days hx pancreatitis  He has been drinking alcohol today

## 2015-09-12 NOTE — Discharge Instructions (Signed)
Community Resource Guide Outpatient Counseling/Substance Abuse Adult °The [employer]’s “211” is a great source of information about community services available.  Access by dialing 2-1-1 from anywhere in [place], or by website -  www.nc211.org.  ° °Other Local Resources (Updated 05/2015) ° °Crisis Hotlines °  °Services  ° °  °Area Served  °[facility] • Crisis Hotline, available 24 hours a day, 7 days a week: 800-939-5911 [place], [place]  ° [facility] • Crisis Hotline, available 24 hours a day, 7 days a week: 866-275-9552 [place], [place]  °[facility] • Suicide Prevention Hotline, available 24 hours a day, 7 days a week: 800-273-8255 [place], [place]  °[facility] ° • Crisis Hotline, available 24 hours a day, 7 days a week: 336-676-6840 [place], [place] °  °[facility] • Crisis Hotline, available 24 hours a day, 7 days a week: 800-256-2452 All °  °[facility] • Crisis Hotline, available 24 hours a day, 7 days a week: 877-626-1772 All  ° °Other Local Resources (Updated 05/2015) ° °Outpatient Counseling/ Substance Abuse Programs  °Services  ° °  °Address and Phone Number  °[facility] ° • Options include Individual counseling, group counseling, intensive outpatient program (several hours a day, several days a week) °• Offers depression assessments °• Provides methadone maintenance program 336-333-6860 °[address] °[place], [place] [address] °  °[facility] ° • Offers partial hospitalization/day treatment and DUI/DWI programs °• Accepts Medicare, private insurance 336-299-4655 °[address] °[place], [place] [address]  °[facility] ° ° • Services include intensive outpatient program (several hours a day, several days a week), outpatient treatment, DUI/DWI services, family education °• Also has some services specifically for Veterans °• Offers transitional housing   [phone] °[address] °[place], [place] [address] °  °  °[facility] • Accepts Medicare, private pay, and private insurance 336-272-0855 °[address] °[place], [place] [address]  °[facility] • Services include individual counseling, substance abuse intensive outpatient program (several hours a day, several days a week), day treatment °• Accepts Medicare, Medicaid, private insurance 336-271-5888 °[address] °[place], [place] [address]  °[facility] ° • Offers substance abuse intensive outpatient program (several hours a day, several days a week), partial hospitalization program 336-832-9800 °[address] °[place], [place] [address] ° °336-349-4454 °[address] °[place], [place] [address] ° °336-386-3795 °[address] °[place], [place] [address] ° °[phone] °[address] °[place], [place] [address]  °[facility] • Individual counseling only °• Accepts private insurance only 336-292-1510 °[address] °[place], [place] [address]  °[facility] • Methadone maintenance program 800-805-6989 °[address] °[place], [place] [address]  °[facility] • Walk-In Clinic providing substance abuse and mental health counseling °• Accepts Medicaid, Medicare, private insurance °• Offers sliding scale for uninsured 336-342-8316 °[address] °[place], [place]   °[facility] • Offers individual counseling, and intensive in-home services 336-347-7415 °[address] °[place], [place] [address]  °[facility] • Offers individual counseling, family counseling, group therapy, domestic violence counseling, consumer credit counseling °• Accepts Medicare, Medicaid, private insurance °• Offers sliding scale for uninsured 336-387-6161 °[address] °[place], [place] [address] ° °336-889-6161 °[place], [address] °[place],  [address]  °[facility] • Offers individual, family   and group counseling °• 3 locations - [place], [place], and [place] ° 336-899-8800 ° °[address] °[place], [place] [address] ° °[address] °[place], [place] [address] ° °[address] °[place], [place] [address]  °[facility]  ° • Offers psychiatric assessment, 8-week Intensive Outpatient Program (several hours a day, several times a week, daytime or evenings), early recovery group, family Program, medication management °• Private pay or private insurance only 336 -[phone], or  °800-659-3381 °[address] °[place], [place] [address]  °[facility] • Offers individual, couples and family counseling °• Accepts Medicaid, private insurance, and sliding scale for uninsured 336-542-2076 °[address] °[place], [place] [address]  °David Fuller, MD • Individual counseling °• Private insurance 336-852-4051 °[address] °[place], [place] [address]  °[facility] ° • Offers assessment, substance abuse treatment, and behavioral health treatment [phone] °[address] °[place], [place] [address]  °[facility] • Individual counseling °• Accepts private insurance 336-272-1972 °[address] °[place], [place] [address]  °[facility] • Individual counseling °• Accepts Medicare, private insurance [phone] °[address] °[place], [place] [address]  °[facility]  ° • Offers intensive outpatient program (several hours a day, several times a week) °• Private pay, private insurance [phone] °[name] °[place], [place]  °[facility] • Individual counseling °• Medicare, private insurance 336-505-9494 °[name] °[place], [place] [address]  °[facility]  ° • Offers intensive outpatient program (several hours a day, several times a week) and partial hospitalization  program 336-794-3550 °[address] °[place], [place]  [address]  °Parrish McKinney, MD • Individual counseling 336-282-1251 °[address] °[place], [place] [address]  °[facility] • Offers Christian counseling to individuals, couples, and families °• Accepts Medicare and private insurance; offers sliding scale for uninsured 336-288-1484 °[address] °[place], [place] [address]  °[facility] • Christian counseling 336-542-2060 °[address] °[place], [place] [address]  °[facility] ° • Offers crisis counseling, individual counseling, group therapy, in-home therapy, domestic violence services, day treatment, DWI services, Community Support Team (CST), Assertive Community Treatment Team (ACTT), substance abuse Intensive Outpatient Program (several hours a day, several times a week) °• 2 locations - [place] and [place] 336-229-5905 °[address] °[place], [place] [address] ° °336-694-1777 °[address] °[place], [place] [address]  °[facility]  ° ° • Individual counseling and group therapy °• Accepts private insurance, Medicare, Medicaid 336-379-7146 °[address] °[place], [place]  °[facility] • Offers individual and family counseling °• Offers LGBTQ services °• Accepts private insurance and private pay 336-288-9190 °[address] °[place], [place] [address]  °[facility]  ° • Offers individual counseling, group therapy, and outpatient detox °• Accepts private insurance 336-389-1413 °[address] °[place], [place]  °[facility] • Individual counseling °• Accepts Medicare, private insurance 336-632-3505 °[address] °, [place] [address]  °[facility] • Individual counseling °• Accepts Medicare, private insurance 336-570-0104 °[address] °[place], [place] [address]  °[facility] ° • Offers substance abuse  Intensive Outpatient Program (several hours a day, several times a week) [phone], or °[phone] °,   ° °

## 2015-09-12 NOTE — ED Notes (Signed)
Patient presents via EMS with c/o abd pain (seen here last night for the same and with history)  Stated he could not get his prescriptions filled today.  Vomited 2 times  CBG 81 per EMS

## 2015-09-12 NOTE — ED Provider Notes (Signed)
CSN: [id number]     Arrival date & time 09/12/15  0113 History  By signing my name below, I, [name], attest that this documentation has been prepared under the direction and in the presence of [name], MD. Electronically Signed: Julien Nordmann, ED Scribe. 09/12/2015. 3:24 AM.    Chief Complaint  Patient presents with  . Abdominal Pain     The history is provided by the patient. No language interpreter was used.   HPI Comments: [name] is a 45 y.o. male who has a PMHx of HTN, pancreatitis, GERD, pneumonia, WPW and cocaine abuse presents to the Emergency Department complaining of intermittent, gradual worsening, moderate upper abdominal pain onset several days ago. He has been having associated vomiting (4x) and diarrhea. He believes that his having a pancreatitis flare up due to his symptoms. Pt admits to drinking alcohol today. Pt denies any other symptoms. Past Medical History  Diagnosis Date  . Hypertension   . Asthma   . Pancreatitis   . Cocaine abuse   . Depression   . H/O suicide attempt 10/2012  . Heart murmur     "when he was little" (03/06/2013)  . Anemia   . H/O hiatal hernia   . GERD (gastroesophageal reflux disease)   . Anxiety   . WPW (Wolff-Parkinson-White syndrome)     [name] 03/06/2013  . High cholesterol   . Femoral condyle fracture ([place]) 03/08/2014    left medial/notes 03/09/2014  . Alcoholism /alcohol abuse ([place])   . Family history of adverse reaction to anesthesia     "grandmother gets confused"  . Shortness of breath     "can happen at anytime" (03/06/2013)  . Pneumonia 1990's X 3  . Chronic bronchitis ([place])   . Sickle cell trait ([place])   . History of blood transfusion 10/2012    "when I tried to commit suicide"  . History of stomach ulcers   . Migraine     "a few times/year" (03/26/2015)  . Arthritis     "knees; arms; elbows" (03/26/2015)  . Chronic lower back pain   . Bipolar disorder ([place])   . PTSD (post-traumatic stress  disorder)   . Marijuana abuse, continuous    Past Surgical History  Procedure Laterality Date  . Facial fracture surgery Left 1990's    "result of trauma"   . Eye surgery Left 1990's    "result of trauma"   . Left heart catheterization with coronary angiogram Right 03/07/2013    Procedure: LEFT HEART CATHETERIZATION WITH CORONARY ANGIOGRAM;  Surgeon: [name], MD;  Location: [facility];  Service: Cardiovascular;  Laterality: Right;  . Cardiac catheterization    . Fracture surgery    . Umbilical hernia repair    . Hernia repair     Family History  Problem Relation Age of Onset  . Hypertension Other   . Coronary artery disease Other    Social History  Substance Use Topics  . Smoking status: Current Every Day Smoker -- 1.00 packs/day for 30 years    Types: Cigarettes  . Smokeless tobacco: Current User    Types: Chew  . Alcohol Use: 37.8 oz/week    63 Cans of beer per week     Comment: last drink was 06/18/15    Review of Systems  A complete 10 system review of systems was obtained and all systems are negative except as noted in the HPI and PMH.    Allergies  Shellfish-derived products and Trazodone and  nefazodone  Home Medications   Prior to Admission medications   Medication Sig Start Date End Date Taking? Authorizing Provider  albuterol (PROVENTIL HFA;VENTOLIN HFA) 108 (90 BASE) MCG/ACT inhaler Inhale 2 puffs into the lungs every 6 (six) hours as needed for wheezing or shortness of breath (wheezing).     Historical Provider, MD  lipase/protease/amylase (CREON) 12000 UNITS CPEP capsule Take 2 capsules (24,000 Units total) by mouth 3 (three) times daily with meals. 01/15/15   [name], MD  metoprolol tartrate (LOPRESSOR) 25 MG tablet Take 1 tablet (25 mg total) by mouth 2 (two) times daily. 01/15/15   [name], MD  ondansetron (ZOFRAN ODT) 8 MG disintegrating tablet 8mg  ODT q4 hours prn nausea 09/06/15   Ripley Fraise, MD  oxyCODONE-acetaminophen  (PERCOCET/ROXICET) 5-325 MG tablet Take 1 tablet by mouth every 6 (six) hours as needed for severe pain. 08/26/15   Dalia Heading, PA-C  promethazine (PHENERGAN) 25 MG tablet Take 1 tablet (25 mg total) by mouth every 8 (eight) hours as needed for nausea or vomiting. 08/26/15   [name], PA-C  QUEtiapine (SEROQUEL) 50 MG tablet Take 100 mg by mouth at bedtime. 01/15/15   Historical Provider, MD  ranitidine (ZANTAC) 150 MG tablet Take 1 tablet (150 mg total) by mouth 2 (two) times daily. 08/24/15   [name], MD  sertraline (ZOLOFT) 25 MG tablet Take 75 mg by mouth daily.    Historical Provider, MD  sildenafil (VIAGRA) 50 MG tablet Take 1 tablet (50 mg total) by mouth daily as needed for erectile dysfunction. 03/31/15   [name], MD   Triage vitals: BP 132/90 mmHg  Pulse 113  Temp(Src) 97.9 F (36.6 C) (Oral)  Resp 20  Ht 5\' 9"  (1.753 m)  Wt 143 lb (64.864 kg)  BMI 21.11 kg/m2  SpO2 98% Physical Exam  Constitutional: He is oriented to person, place, and time. He appears well-developed and well-nourished.  HENT:  Head: Normocephalic.  Eyes: EOM are normal.  Neck: Normal range of motion.  Pulmonary/Chest: Effort normal.  Abdominal: He exhibits no distension. There is tenderness.  Mild epigastric tenderness  Musculoskeletal: Normal range of motion.  Neurological: He is alert and oriented to person, place, and time.  Psychiatric: He has a normal mood and affect.  Nursing note and vitals reviewed.   ED Course  Procedures  DIAGNOSTIC STUDIES: Oxygen Saturation is 98% on RA, normal by my interpretation.  COORDINATION OF CARE:  3:22 AM Discussed treatment plan which includes IV fluids and pain medication with pt at bedside and pt agreed to plan.  Labs Review Labs Reviewed  LIPASE, BLOOD - Abnormal; Notable for the following:    Lipase 151 (*)    All other components within normal limits  COMPREHENSIVE METABOLIC PANEL - Abnormal; Notable for the following:     CO2 20 (*)    AST 82 (*)    ALT 71 (*)    All other components within normal limits  CBC - Abnormal; Notable for the following:    RBC 3.76 (*)    Hemoglobin 12.1 (*)    HCT 35.6 (*)    All other components within normal limits  ETHANOL - Abnormal; Notable for the following:    Alcohol, Ethyl (B) 11 (*)    All other components within normal limits  URINALYSIS, ROUTINE W REFLEX MICROSCOPIC (NOT AT [facility])    Imaging Review No results found. I have personally reviewed and evaluated these images and lab results as part  of my medical decision-making.   EKG Interpretation None      MDM   Final diagnoses:  Abdominal pain, unspecified abdominal location  Alcohol-induced chronic pancreatitis ([place])                Feels better at time of discharge. Likely chronic abdominal pain  I personally performed the services described in this documentation, which was scribed in my presence. The recorded information has been reviewed and is accurate.      [name], MD 09/14/15 [phone]

## 2015-09-12 NOTE — ED Provider Notes (Signed)
CSN: [id number]     Arrival date & time 09/12/15  1931 History   First MD Initiated Contact with Patient 09/12/15 1944     Chief Complaint  Patient presents with  . Abdominal Pain    Patient is a 45 y.o. male presenting with abdominal pain. The history is provided by the patient and medical records.  Abdominal Pain Pain location:  Epigastric Pain quality: aching   Pain radiates to:  LUQ Pain severity:  Severe Onset quality:  Gradual Duration:  2 days Timing:  Constant Progression:  Unchanged Chronicity:  Recurrent (exactly like prior etoh induced pancreatitis flares) Context: alcohol use (6 pack per day, worse after he drank yesterday) and eating   Context: not previous surgeries, not suspicious food intake and not trauma   Associated symptoms: anorexia, nausea and vomiting (x1 after chicken borth)   Associated symptoms: no chest pain, no chills, no constipation, no cough, no diarrhea, no dysuria, no fatigue, no fever, no hematochezia, no hematuria and no shortness of breath   Risk factors comment:  Etoh, cocaine, pancreatitis   Past Medical History  Diagnosis Date  . Hypertension   . Asthma   . Pancreatitis   . Cocaine abuse   . Depression   . H/O suicide attempt 10/2012  . Heart murmur     "when he was little" (03/06/2013)  . Anemia   . H/O hiatal hernia   . GERD (gastroesophageal reflux disease)   . Anxiety   . WPW (Wolff-Parkinson-White syndrome)     [name] 03/06/2013  . High cholesterol   . Femoral condyle fracture ([place]) 03/08/2014    left medial/notes 03/09/2014  . Alcoholism /alcohol abuse ([place])   . Family history of adverse reaction to anesthesia     "grandmother gets confused"  . Shortness of breath     "can happen at anytime" (03/06/2013)  . Pneumonia 1990's X 3  . Chronic bronchitis ([place])   . Sickle cell trait ([place])   . History of blood transfusion 10/2012    "when I tried to commit suicide"  . History of stomach ulcers   . Migraine     "a few  times/year" (03/26/2015)  . Arthritis     "knees; arms; elbows" (03/26/2015)  . Chronic lower back pain   . Bipolar disorder ([place])   . PTSD (post-traumatic stress disorder)   . Marijuana abuse, continuous    Past Surgical History  Procedure Laterality Date  . Facial fracture surgery Left 1990's    "result of trauma"   . Eye surgery Left 1990's    "result of trauma"   . Left heart catheterization with coronary angiogram Right 03/07/2013    Procedure: LEFT HEART CATHETERIZATION WITH CORONARY ANGIOGRAM;  Surgeon: [name], MD;  Location: [facility];  Service: Cardiovascular;  Laterality: Right;  . Cardiac catheterization    . Fracture surgery    . Umbilical hernia repair    . Hernia repair     Family History  Problem Relation Age of Onset  . Hypertension Other   . Coronary artery disease Other    Social History  Substance Use Topics  . Smoking status: Current Every Day Smoker -- 1.00 packs/day for 30 years    Types: Cigarettes  . Smokeless tobacco: Current User    Types: Chew  . Alcohol Use: 37.8 oz/week    63 Cans of beer per week     Comment: last drink was 06/18/15    Review of Systems  Constitutional:  Negative for fever, chills and fatigue.  Respiratory: Negative for cough, shortness of breath and wheezing.   Cardiovascular: Negative for chest pain.  Gastrointestinal: Positive for nausea, vomiting (x1 after chicken borth), abdominal pain and anorexia. Negative for diarrhea, constipation, hematochezia and abdominal distention.  Genitourinary: Negative for dysuria and hematuria.  Musculoskeletal: Negative for back pain.  Skin: Negative for rash.  All other systems reviewed and are negative.  Allergies  Shellfish-derived products and Trazodone and nefazodone  Home Medications   Prior to Admission medications   Medication Sig Start Date End Date Taking? Authorizing Provider  albuterol (PROVENTIL HFA;VENTOLIN HFA) 108 (90 BASE) MCG/ACT inhaler Inhale 2 puffs  into the lungs every 6 (six) hours as needed for wheezing or shortness of breath (wheezing).     Historical Provider, MD  lipase/protease/amylase (CREON) 12000 UNITS CPEP capsule Take 2 capsules (24,000 Units total) by mouth 3 (three) times daily with meals. 01/15/15   [name], MD  metoprolol tartrate (LOPRESSOR) 25 MG tablet Take 1 tablet (25 mg total) by mouth 2 (two) times daily. 01/15/15   [name], MD  ondansetron (ZOFRAN-ODT) 4 MG disintegrating tablet Take 1 tablet (4 mg total) by mouth every 8 (eight) hours as needed for nausea or vomiting. 8mg  ODT q4 hours prn nausea 09/12/15   [name], MD  oxyCODONE-acetaminophen (PERCOCET/ROXICET) 5-325 MG tablet Take 1 tablet by mouth every 6 (six) hours as needed for severe pain. 08/26/15   Dalia Heading, PA-C  promethazine (PHENERGAN) 25 MG tablet Take 1 tablet (25 mg total) by mouth every 8 (eight) hours as needed for nausea or vomiting. 08/26/15   [name], PA-C  QUEtiapine (SEROQUEL) 50 MG tablet Take 100 mg by mouth at bedtime. 01/15/15   Historical Provider, MD  ranitidine (ZANTAC) 150 MG tablet Take 1 tablet (150 mg total) by mouth 2 (two) times daily. 08/24/15   [name], MD  sertraline (ZOLOFT) 25 MG tablet Take 75 mg by mouth daily.    Historical Provider, MD  sildenafil (VIAGRA) 50 MG tablet Take 1 tablet (50 mg total) by mouth daily as needed for erectile dysfunction. 03/31/15   [name], MD   BP 149/97 mmHg  Pulse 95  Temp(Src) 98 F (36.7 C) (Oral)  Resp 16  Wt 64.864 kg  SpO2 95% Physical Exam  Constitutional: He is oriented to person, place, and time. He appears well-developed and well-nourished. No distress.  Intermittently in pain to abd   HENT:  Head: Normocephalic and atraumatic.  Nose: Nose normal.  Eyes: Conjunctivae are normal.  Neck: Normal range of motion. Neck supple. No tracheal deviation present.  Cardiovascular: Normal rate, regular rhythm and normal heart sounds.    No murmur heard. Pulmonary/Chest: Effort normal and breath sounds normal. No respiratory distress. He has no rales.  Abdominal: Soft. Bowel sounds are normal. He exhibits no distension and no mass. There is tenderness (epigastric). There is no rebound and no guarding.  Neg cvat. Neg murphys. Neg heel tap  Musculoskeletal: Normal range of motion. He exhibits no edema.  Neurological: He is alert and oriented to person, place, and time.  Skin: Skin is warm and dry. No rash noted. He is not diaphoretic.  Psychiatric: He has a normal mood and affect.  Nursing note and vitals reviewed.   ED Course  Procedures (including critical care time) Labs Review Labs Reviewed  LIPASE, BLOOD - Abnormal; Notable for the following:    Lipase 341 (*)    All other components  within normal limits  COMPREHENSIVE METABOLIC PANEL - Abnormal; Notable for the following:    BUN <5 (*)    AST 56 (*)    All other components within normal limits  CBC - Abnormal; Notable for the following:    RBC 3.60 (*)    Hemoglobin 11.1 (*)    HCT 33.5 (*)    All other components within normal limits  URINALYSIS, ROUTINE W REFLEX MICROSCOPIC (NOT AT [facility])    Imaging Review No results found. I have personally reviewed and evaluated these images and lab results as part of my medical decision-making.   EKG Interpretation None      MDM   Final diagnoses:  Alcohol-induced acute pancreatitis without infection or necrosis   Past medical history of chronic pancreatitis and to polysubstance/alcohol abuse reviewed. No signs of systemic toxicity or peritonitis to suggest surgical emergency. Vital signs stable. Afebrile; no leukocytosis doubt superinfection. This appears to be an acute exacerbation of his chronic pain condition setting of alcohol abuse. Lipase noted to be elevated to 341.  Doubt SBO as clinical history would not correlate. Primary dx much more likely. No urinary or GI sx otherwise.  Reviewed care plan in place.   CT imaging not indicated.  Labs without obstructive biliary process apparent.  Recomend to taper ETOH use to zero.    [place] 0- at most 1, did not check LDH. Very low suspicion for necrotizing/infectious pancreatitis.    Repeat exams, abd remains wo peritonitis.  Remains nontoxic appearing.  Vitals stable  At discharge, pts pain was better controlled. Tolerating PO, drank ginger ale.  Counseled on diet modifications, stop ETOH, bowel rest then increase diet as tolerated.  Discussed home pain control. Discussed s/s warranting return such as severe pain, fevers, unable to eat/drink/ abd distension.      [name], MD 09/13/15 Benancio Deeds  Pattricia Boss, MD 09/16/15 [phone]

## 2015-09-13 ENCOUNTER — Encounter (HOSPITAL_COMMUNITY): Payer: Self-pay | Admitting: Emergency Medicine

## 2015-09-13 ENCOUNTER — Emergency Department (HOSPITAL_COMMUNITY)
Admission: EM | Admit: 2015-09-13 | Discharge: 2015-09-14 | Disposition: A | Discharge status: Home / Self Care | Payer: No Typology Code available for payment source | Attending: Emergency Medicine | Admitting: Emergency Medicine

## 2015-09-13 ENCOUNTER — Emergency Department (HOSPITAL_COMMUNITY)
Admission: EM | Admit: 2015-09-13 | Discharge: 2015-09-13 | Disposition: A | Discharge status: Home / Self Care | Payer: No Typology Code available for payment source | Attending: Emergency Medicine | Admitting: Emergency Medicine

## 2015-09-13 DIAGNOSIS — K219 Gastro-esophageal reflux disease without esophagitis: Secondary | ICD-10-CM | POA: Insufficient documentation

## 2015-09-13 DIAGNOSIS — F4310 Post-traumatic stress disorder, unspecified: Secondary | ICD-10-CM | POA: Insufficient documentation

## 2015-09-13 DIAGNOSIS — F419 Anxiety disorder, unspecified: Secondary | ICD-10-CM | POA: Insufficient documentation

## 2015-09-13 DIAGNOSIS — Z7952 Long term (current) use of systemic steroids: Secondary | ICD-10-CM | POA: Insufficient documentation

## 2015-09-13 DIAGNOSIS — R011 Cardiac murmur, unspecified: Secondary | ICD-10-CM | POA: Insufficient documentation

## 2015-09-13 DIAGNOSIS — Z862 Personal history of diseases of the blood and blood-forming organs and certain disorders involving the immune mechanism: Secondary | ICD-10-CM | POA: Insufficient documentation

## 2015-09-13 DIAGNOSIS — K852 Alcohol induced acute pancreatitis without necrosis or infection: Secondary | ICD-10-CM | POA: Insufficient documentation

## 2015-09-13 DIAGNOSIS — E7800 Pure hypercholesterolemia, unspecified: Secondary | ICD-10-CM | POA: Insufficient documentation

## 2015-09-13 DIAGNOSIS — Z79899 Other long term (current) drug therapy: Secondary | ICD-10-CM | POA: Insufficient documentation

## 2015-09-13 DIAGNOSIS — J45909 Unspecified asthma, uncomplicated: Secondary | ICD-10-CM | POA: Insufficient documentation

## 2015-09-13 DIAGNOSIS — M19029 Primary osteoarthritis, unspecified elbow: Secondary | ICD-10-CM | POA: Insufficient documentation

## 2015-09-13 DIAGNOSIS — Z7951 Long term (current) use of inhaled steroids: Secondary | ICD-10-CM | POA: Insufficient documentation

## 2015-09-13 DIAGNOSIS — K859 Acute pancreatitis without necrosis or infection, unspecified: Secondary | ICD-10-CM | POA: Insufficient documentation

## 2015-09-13 DIAGNOSIS — I456 Pre-excitation syndrome: Secondary | ICD-10-CM | POA: Insufficient documentation

## 2015-09-13 DIAGNOSIS — F319 Bipolar disorder, unspecified: Secondary | ICD-10-CM | POA: Insufficient documentation

## 2015-09-13 DIAGNOSIS — M170 Bilateral primary osteoarthritis of knee: Secondary | ICD-10-CM | POA: Insufficient documentation

## 2015-09-13 DIAGNOSIS — R1013 Epigastric pain: Secondary | ICD-10-CM

## 2015-09-13 DIAGNOSIS — M179 Osteoarthritis of knee, unspecified: Secondary | ICD-10-CM | POA: Insufficient documentation

## 2015-09-13 DIAGNOSIS — F17210 Nicotine dependence, cigarettes, uncomplicated: Secondary | ICD-10-CM | POA: Insufficient documentation

## 2015-09-13 DIAGNOSIS — G8929 Other chronic pain: Secondary | ICD-10-CM | POA: Insufficient documentation

## 2015-09-13 DIAGNOSIS — K861 Other chronic pancreatitis: Secondary | ICD-10-CM | POA: Insufficient documentation

## 2015-09-13 DIAGNOSIS — M19022 Primary osteoarthritis, left elbow: Secondary | ICD-10-CM | POA: Insufficient documentation

## 2015-09-13 DIAGNOSIS — Z791 Long term (current) use of non-steroidal anti-inflammatories (NSAID): Secondary | ICD-10-CM | POA: Insufficient documentation

## 2015-09-13 DIAGNOSIS — Z8781 Personal history of (healed) traumatic fracture: Secondary | ICD-10-CM | POA: Insufficient documentation

## 2015-09-13 DIAGNOSIS — I10 Essential (primary) hypertension: Secondary | ICD-10-CM | POA: Insufficient documentation

## 2015-09-13 DIAGNOSIS — Z79891 Long term (current) use of opiate analgesic: Secondary | ICD-10-CM | POA: Insufficient documentation

## 2015-09-13 DIAGNOSIS — G43909 Migraine, unspecified, not intractable, without status migrainosus: Secondary | ICD-10-CM | POA: Insufficient documentation

## 2015-09-13 DIAGNOSIS — M19021 Primary osteoarthritis, right elbow: Secondary | ICD-10-CM | POA: Insufficient documentation

## 2015-09-13 LAB — COMPREHENSIVE METABOLIC PANEL
ALT: 52 U/L (ref 17–63)
ANION GAP: 11 (ref 5–15)
AST: 44 U/L — ABNORMAL HIGH (ref 15–41)
Albumin: 3.7 g/dL (ref 3.5–5.0)
Alkaline Phosphatase: 94 U/L (ref 38–126)
BUN: 6 mg/dL (ref 6–20)
CHLORIDE: 105 mmol/L (ref 101–111)
CO2: 22 mmol/L (ref 22–32)
Calcium: 8.7 mg/dL — ABNORMAL LOW (ref 8.9–10.3)
Creatinine, Ser: 0.62 mg/dL (ref 0.61–1.24)
GFR calc non Af Amer: >60 mL/min (ref >60)
Glucose, Bld: 71 mg/dL (ref 65–99)
POTASSIUM: 3.4 mmol/L — AB (ref 3.5–5.1)
SODIUM: 138 mmol/L (ref 135–145)
Total Bilirubin: 0.9 mg/dL (ref 0.3–1.2)
Total Protein: 6.6 g/dL (ref 6.5–8.1)

## 2015-09-13 LAB — CBC WITH DIFFERENTIAL/PLATELET
BASOS ABS: 0 10*3/uL (ref 0.0–0.1)
Basophils Relative: 0 %
Eosinophils Absolute: 0.3 10*3/uL (ref 0.0–0.7)
Eosinophils Relative: 3 %
HCT: 32.7 % — ABNORMAL LOW (ref 39.0–52.0)
HEMOGLOBIN: 11.3 g/dL — AB (ref 13.0–17.0)
LYMPHS ABS: 2.3 10*3/uL (ref 0.7–4.0)
LYMPHS PCT: 22 %
MCH: 31.7 pg (ref 26.0–34.0)
MCHC: 34.6 g/dL (ref 30.0–36.0)
MCV: 91.6 fL (ref 78.0–100.0)
Monocytes Absolute: 0.7 10*3/uL (ref 0.1–1.0)
Monocytes Relative: 7 %
NEUTROS PCT: 68 %
Neutro Abs: 7.1 10*3/uL (ref 1.7–7.7)
Platelets: 217 10*3/uL (ref 150–400)
RBC: 3.57 MIL/uL — AB (ref 4.22–5.81)
RDW: 14.3 % (ref 11.5–15.5)
WBC: 10.5 10*3/uL (ref 4.0–10.5)

## 2015-09-13 LAB — LIPASE, BLOOD
LIPASE: 256 U/L — AB (ref 11–51)
LIPASE: 130 U/L — AB (ref 11–51)

## 2015-09-13 MED ORDER — FENTANYL CITRATE (PF) 100 MCG/2ML IJ SOLN
50.0000 ug | Freq: Once | INTRAMUSCULAR | Status: AC
  Administered 2015-09-13: 50 ug via INTRAVENOUS
  Filled 2015-09-13: qty 2

## 2015-09-13 MED ORDER — KETOROLAC TROMETHAMINE 15 MG/ML IJ SOLN
15.0000 mg | Freq: Once | INTRAMUSCULAR | Status: AC
  Administered 2015-09-13: 15 mg via INTRAVENOUS
  Filled 2015-09-13: qty 1

## 2015-09-13 MED ORDER — SODIUM CHLORIDE 0.9 % IV BOLUS (SEPSIS)
1000.0000 mL | Freq: Once | INTRAVENOUS | Status: AC
Start: 2015-09-13 — End: 2015-09-13
  Administered 2015-09-13: 1000 mL via INTRAVENOUS

## 2015-09-13 MED ORDER — GI COCKTAIL ~~LOC~~
30.0000 mL | Freq: Once | ORAL | Status: AC
  Administered 2015-09-13: 30 mL via ORAL
  Filled 2015-09-13: qty 30

## 2015-09-13 MED ORDER — ONDANSETRON HCL 4 MG/2ML IJ SOLN
4.0000 mg | Freq: Once | INTRAMUSCULAR | Status: AC
  Administered 2015-09-13: 4 mg via INTRAVENOUS
  Filled 2015-09-13: qty 2

## 2015-09-13 MED ORDER — SODIUM CHLORIDE 0.9 % IV BOLUS (SEPSIS)
1000.0000 mL | Freq: Once | INTRAVENOUS | Status: AC
  Administered 2015-09-13: 1000 mL via INTRAVENOUS

## 2015-09-13 NOTE — ED Notes (Addendum)
Pt just left [facility] this morning after being seen for abdominal pain. He told EMS they gave him a shot for pain while there but no prescriptions.   Pt states they told him they wanted to keep him, but that they let him go and told him to drink fluids

## 2015-09-13 NOTE — Discharge Instructions (Signed)
The inflammation of your pancreas is decreasing. Continue on a fluid diet for the next few days until your are feeling better. Please avoid alcohol during the time and we encourage you to stop drinking all together to prevent this problem from happening again.   Abdominal Pain, Adult Many things can cause belly (abdominal) pain. Most times, the belly pain is not dangerous. Many cases of belly pain can be watched and treated at home. HOME CARE   Do not take medicines that help you go poop (laxatives) unless told to by your doctor.  Only take medicine as told by your doctor.  Eat or drink as told by your doctor. Your doctor will tell you if you should be on a special diet. GET HELP IF:  You do not know what is causing your belly pain.  You have belly pain while you are sick to your stomach (nauseous) or have runny poop (diarrhea).  You have pain while you pee or poop.  Your belly pain wakes you up at night.  You have belly pain that gets worse or better when you eat.  You have belly pain that gets worse when you eat fatty foods.  You have a fever. GET HELP RIGHT AWAY IF:   The pain does not go away within 2 hours.  You keep throwing up (vomiting).  The pain changes and is only in the right or left part of the belly.  You have bloody or tarry looking poop. MAKE SURE YOU:   Understand these instructions.  Will watch your condition.  Will get help right away if you are not doing well or get worse.   This information is not intended to replace advice given to you by your health care provider. Make sure you discuss any questions you have with your health care provider.   Document Released: 10/12/2007 Document Revised: 05/16/2014 Document Reviewed: 01/02/2013 Elsevier Interactive Patient Education [employer].

## 2015-09-13 NOTE — ED Provider Notes (Signed)
CSN: [id number]     Arrival date & time 09/13/15  2207 History   By signing my name below, I, [name], attest that this documentation has been prepared under the direction and in the presence of [name].  Electronically Signed: [name], ED Scribe. 09/13/2015. 10:45 PM.   Chief Complaint  Patient presents with  . Abdominal Pain   The history is provided by the patient. No language interpreter was used.    HPI Comments: EDGERRIN Moran is a 45 y.o. male with a PMHx of HTN, chronic pancreatitis, WPW, high cholesterol, alcohol abuse who presents to the Emergency Department complaining of constant, ongoing RUQ abdominal pain with associated intermittent nausea onset this evening at approximately 6:00 PM after a wedding reception. Discomfort is exacerbated when drinking any kind of liquid and mildly alleviated when walking around. OTC Ibuprofen attempted prior to arrival without any improvement. No recent fever, chills, nausea, vomiting, chest pain, or shortness of breath. Pt states he hasn't had any alcoholic beverages in over 30 hours. [name] was evaluated earlier this morning for same but was safely discharged home. Pt has also been evaluated in the Emergency Department 4 times in the last month for similar symptoms.  PCP: [name], NP    Past Medical History  Diagnosis Date  . Hypertension   . Asthma   . Pancreatitis   . Cocaine abuse   . Depression   . H/O suicide attempt 10/2012  . Heart murmur     "when he was little" (03/06/2013)  . Anemia   . H/O hiatal hernia   . GERD (gastroesophageal reflux disease)   . Anxiety   . WPW (Wolff-Parkinson-White syndrome)     [name] 03/06/2013  . High cholesterol   . Femoral condyle fracture ([place]) 03/08/2014    left medial/notes 03/09/2014  . Alcoholism /alcohol abuse ([place])   . Family history of adverse reaction to anesthesia     "grandmother gets confused"  . Shortness of breath     "can happen at anytime"  (03/06/2013)  . Pneumonia 1990's X 3  . Chronic bronchitis ([place])   . Sickle cell trait ([place])   . History of blood transfusion 10/2012    "when I tried to commit suicide"  . History of stomach ulcers   . Migraine     "a few times/year" (03/26/2015)  . Arthritis     "knees; arms; elbows" (03/26/2015)  . Chronic lower back pain   . Bipolar disorder ([place])   . PTSD (post-traumatic stress disorder)   . Marijuana abuse, continuous    Past Surgical History  Procedure Laterality Date  . Facial fracture surgery Left 1990's    "result of trauma"   . Eye surgery Left 1990's    "result of trauma"   . Left heart catheterization with coronary angiogram Right 03/07/2013    Procedure: LEFT HEART CATHETERIZATION WITH CORONARY ANGIOGRAM;  Surgeon: [name], MD;  Location: [facility];  Service: Cardiovascular;  Laterality: Right;  . Cardiac catheterization    . Fracture surgery    . Umbilical hernia repair    . Hernia repair     Family History  Problem Relation Age of Onset  . Hypertension Other   . Coronary artery disease Other    Social History  Substance Use Topics  . Smoking status: Current Every Day Smoker -- 1.00 packs/day for 30 years    Types: Cigarettes  . Smokeless tobacco: Current User    Types:  Chew  . Alcohol Use: 37.8 oz/week    63 Cans of beer per week     Comment: last drink was 06/18/15    Review of Systems  Constitutional: Negative for fever.  Respiratory: Negative for cough and shortness of breath.   Cardiovascular: Negative for chest pain.  Gastrointestinal: Positive for nausea and abdominal pain. Negative for vomiting.  Neurological: Negative for headaches.  Psychiatric/Behavioral: Negative for confusion.  All other systems reviewed and are negative.     Allergies  Shellfish-derived products and Trazodone and nefazodone  Home Medications   Prior to Admission medications   Medication Sig Start Date End Date Taking? Authorizing Provider  albuterol  (PROVENTIL HFA;VENTOLIN HFA) 108 (90 BASE) MCG/ACT inhaler Inhale 2 puffs into the lungs every 6 (six) hours as needed for wheezing or shortness of breath (wheezing).    Yes Historical Provider, MD  fluticasone (FLONASE) 50 MCG/ACT nasal spray Place 1 spray into both nostrils daily.   Yes Historical Provider, MD  ibuprofen (ADVIL,MOTRIN) 200 MG tablet Take 200-800 mg by mouth every 6 (six) hours as needed for moderate pain.   Yes Historical Provider, MD  lipase/protease/amylase (CREON) 12000 UNITS CPEP capsule Take 2 capsules (24,000 Units total) by mouth 3 (three) times daily with meals. 01/15/15  Yes [name], MD  metoprolol tartrate (LOPRESSOR) 25 MG tablet Take 1 tablet (25 mg total) by mouth 2 (two) times daily. 01/15/15  Yes [name], MD  ondansetron (ZOFRAN-ODT) 4 MG disintegrating tablet Take 1 tablet (4 mg total) by mouth every 8 (eight) hours as needed for nausea or vomiting. 8mg  ODT q4 hours prn nausea Patient taking differently: Take 4 mg by mouth every 8 (eight) hours as needed for nausea or vomiting.  09/12/15  Yes [name], MD  oxyCODONE-acetaminophen (PERCOCET/ROXICET) 5-325 MG tablet Take 1 tablet by mouth every 6 (six) hours as needed for severe pain. 08/26/15  Yes [name], PA-C  promethazine (PHENERGAN) 25 MG tablet Take 1 tablet (25 mg total) by mouth every 8 (eight) hours as needed for nausea or vomiting. 08/26/15  Yes [name], PA-C  QUEtiapine (SEROQUEL) 50 MG tablet Take 100 mg by mouth at bedtime. 01/15/15  Yes Historical Provider, MD  ranitidine (ZANTAC) 150 MG tablet Take 1 tablet (150 mg total) by mouth 2 (two) times daily. 08/24/15  Yes [name], MD  sertraline (ZOLOFT) 25 MG tablet Take 75 mg by mouth daily.   Yes Historical Provider, MD  sildenafil (VIAGRA) 50 MG tablet Take 1 tablet (50 mg total) by mouth daily as needed for erectile dysfunction. 03/31/15  Yes [name], MD   Triage Vitals: BP 132/93 mmHg  Pulse 83   Temp(Src) 98.4 F (36.9 C) (Oral)  Resp 18  SpO2 98%   Physical Exam  Constitutional: He is oriented to person, place, and time. He appears well-developed and well-nourished.  HENT:  Head: Normocephalic and atraumatic.  Eyes: EOM are normal.  Neck: Normal range of motion.  Cardiovascular: Normal rate, regular rhythm, normal heart sounds and intact distal pulses.   Pulmonary/Chest: Effort normal and breath sounds normal. No respiratory distress.  Abdominal: Soft. Bowel sounds are normal. He exhibits no distension and no mass. There is tenderness. There is no rebound and no guarding.    Musculoskeletal: Normal range of motion.  Neurological: He is alert and oriented to person, place, and time.  Skin: Skin is warm and dry.  Psychiatric: He has a normal mood and affect. Judgment normal.  Nursing  note and vitals reviewed.   ED Course  Procedures (including critical care time)  DIAGNOSTIC STUDIES: Oxygen Saturation is 100% on RA, Normal by my interpretation.    COORDINATION OF CARE: 10:30 PM- Will give fluids, GI cocktail, and Zofran. Will order blood work. Discussed treatment plan with pt at bedside and pt agreed to plan.     Labs Review Labs Reviewed  LIPASE, BLOOD - Abnormal; Notable for the following:    Lipase 130 (*)    All other components within normal limits    Imaging Review No results found. I have personally reviewed and evaluated these images and lab results as part of my medical decision-making.   EKG Interpretation None      MDM   Final diagnoses:  Acute on chronic pancreatitis ([place])   Ryatt R Younghans presents to ED for RUQ abdominal pain which he states is c/w his usual pancreatitis flares. He was seen earlier today for the same where CBC, CMP, and lipase were obtained - these labs/chart were reviewed. Will order lipase to evaluate trend. Patient also appears mildly dehydrated on exam, will give 1L fluids. Zofran and gi cocktail given. Will re-evaluate  after fluids/meds.   Lipase: 130 (256 this morning). Informed patient of lipase trending down and reassured Moran that pancreatic inflammation is improving.  Blood pressure elevated at 165/106-patient states he has been noncompliant with his blood pressure medication. Patient on beta blocker at home, given beta blocker as well as Ativan for potential EtOH withdrawal component. Blood pressure improved to 132/92.  1:50 AM - Patient reevaluated and pain improved after Toradol, IV fluids. Patient well-appearing with now normal HR and BP.  He is drinking ginger ale in the ED without difficulty. Nonsurgical abdomin. Patient is receiving orange card on the 12th, GI referral given. PCP follow-up strongly encouraged. Return precautions discussed and all questions answered.   I personally performed the services described in this documentation, which was scribed in my presence. The recorded information has been reviewed and is accurate.   [facility] Job Holtsclaw, PA-C 09/14/15 [id number]  Virgel Manifold, MD 09/17/15 1256

## 2015-09-13 NOTE — ED Notes (Addendum)
Pt transported from wedding parting c/o RUQ abd pain. Pt treated today at [facility] for same. Pt states pain worse this evening  pt states he usually takes Oxycodone for pain but is out at this time, denies ETOH

## 2015-09-13 NOTE — ED Notes (Signed)
Pt states he wants his lipase level assessed because his pain is 10/10

## 2015-09-13 NOTE — ED Notes (Signed)
Bed: WTR5 Expected date:  Expected time:  Means of arrival:  Comments: 

## 2015-09-13 NOTE — ED Notes (Signed)
Pt states he has not had a drink of alcohol since Friday night, has been throwing up at intervals, one BM this am "a little loose" pt watching TV without  s/s of distress. Continue to monitor status

## 2015-09-13 NOTE — ED Notes (Signed)
Woke pt to assess pain level. States it is 7/10

## 2015-09-13 NOTE — ED Provider Notes (Signed)
CSN: [id number]     Arrival date & time 09/13/15  0710 History   First MD Initiated Contact with Patient 09/13/15 [phone]     Chief Complaint  Patient presents with  . Abdominal Pain     (Consider location/radiation/quality/duration/timing/severity/associated sxs/prior Treatment) HPI  45 year old male who presents with abdominal pain. He has a history of hypertension, chronic alcohol abuse, recurrent pancreatitis, GERD, and multiple ED visits for abdominal pain. Was seen 2 times yesterday for evaluation of epigastric and left upper quadrant abdominal pain that radiated towards his back. Had elevated lipase of 340. Received fentanyl, Toradol, and anti-emetics with improved symptoms and able to tolerate by mouth intake. Was subsequently discharged home. States that his vomiting returned earlier this morning with worsening pain. Came to ED for reevaluation. No fevers or chills. Has associating diarrhea. No melena or hematochezia or hematemesis. Last drink was 2 days ago.  Past Medical History  Diagnosis Date  . Hypertension   . Asthma   . Pancreatitis   . Cocaine abuse   . Depression   . H/O suicide attempt 10/2012  . Heart murmur     "when he was little" (03/06/2013)  . Anemia   . H/O hiatal hernia   . GERD (gastroesophageal reflux disease)   . Anxiety   . WPW (Wolff-Parkinson-White syndrome)     [name] 03/06/2013  . High cholesterol   . Femoral condyle fracture ([place]) 03/08/2014    left medial/notes 03/09/2014  . Alcoholism /alcohol abuse ([place])   . Family history of adverse reaction to anesthesia     "grandmother gets confused"  . Shortness of breath     "can happen at anytime" (03/06/2013)  . Pneumonia 1990's X 3  . Chronic bronchitis ([place])   . Sickle cell trait ([place])   . History of blood transfusion 10/2012    "when I tried to commit suicide"  . History of stomach ulcers   . Migraine     "a few times/year" (03/26/2015)  . Arthritis     "knees; arms; elbows" (03/26/2015)  .  Chronic lower back pain   . Bipolar disorder ([place])   . PTSD (post-traumatic stress disorder)   . Marijuana abuse, continuous    Past Surgical History  Procedure Laterality Date  . Facial fracture surgery Left 1990's    "result of trauma"   . Eye surgery Left 1990's    "result of trauma"   . Left heart catheterization with coronary angiogram Right 03/07/2013    Procedure: LEFT HEART CATHETERIZATION WITH CORONARY ANGIOGRAM;  Surgeon: [name], MD;  Location: [facility];  Service: Cardiovascular;  Laterality: Right;  . Cardiac catheterization    . Fracture surgery    . Umbilical hernia repair    . Hernia repair     Family History  Problem Relation Age of Onset  . Hypertension Other   . Coronary artery disease Other    Social History  Substance Use Topics  . Smoking status: Current Every Day Smoker -- 1.00 packs/day for 30 years    Types: Cigarettes  . Smokeless tobacco: Current User    Types: Chew  . Alcohol Use: 37.8 oz/week    63 Cans of beer per week     Comment: last drink was 06/18/15    Review of Systems 10/14 systems reviewed and are negative other than those stated in the HPI    Allergies  Shellfish-derived products and Trazodone and nefazodone  Home Medications   Prior to Admission medications  Medication Sig Start Date End Date Taking? Authorizing Provider  albuterol (PROVENTIL HFA;VENTOLIN HFA) 108 (90 BASE) MCG/ACT inhaler Inhale 2 puffs into the lungs every 6 (six) hours as needed for wheezing or shortness of breath (wheezing).    Yes Historical Provider, MD  fluticasone (FLONASE) 50 MCG/ACT nasal spray Place 1 spray into both nostrils daily.   Yes Historical Provider, MD  ibuprofen (ADVIL,MOTRIN) 200 MG tablet Take 200-800 mg by mouth every 6 (six) hours as needed for moderate pain.   Yes Historical Provider, MD  lipase/protease/amylase (CREON) 12000 UNITS CPEP capsule Take 2 capsules (24,000 Units total) by mouth 3 (three) times daily with meals.  01/15/15  Yes [name], MD  ondansetron (ZOFRAN-ODT) 4 MG disintegrating tablet Take 1 tablet (4 mg total) by mouth every 8 (eight) hours as needed for nausea or vomiting. 8mg  ODT q4 hours prn nausea Patient taking differently: Take 4 mg by mouth every 8 (eight) hours as needed for nausea or vomiting.  09/12/15  Yes [name], MD  oxyCODONE-acetaminophen (PERCOCET/ROXICET) 5-325 MG tablet Take 1 tablet by mouth every 6 (six) hours as needed for severe pain. 08/26/15  Yes [name], PA-C  promethazine (PHENERGAN) 25 MG tablet Take 1 tablet (25 mg total) by mouth every 8 (eight) hours as needed for nausea or vomiting. 08/26/15  Yes [name], PA-C  QUEtiapine (SEROQUEL) 50 MG tablet Take 100 mg by mouth at bedtime. 01/15/15  Yes Historical Provider, MD  ranitidine (ZANTAC) 150 MG tablet Take 1 tablet (150 mg total) by mouth 2 (two) times daily. 08/24/15  Yes [name], MD  sertraline (ZOLOFT) 25 MG tablet Take 75 mg by mouth daily.   Yes Historical Provider, MD  sildenafil (VIAGRA) 50 MG tablet Take 1 tablet (50 mg total) by mouth daily as needed for erectile dysfunction. 03/31/15  Yes [name], MD  metoprolol tartrate (LOPRESSOR) 25 MG tablet Take 1 tablet (25 mg total) by mouth 2 (two) times daily. Patient not taking: Reported on 09/13/2015 01/15/15   [name], MD   BP 139/91 mmHg  Pulse 98  Temp(Src) 97.4 F (36.3 C) (Oral)  Resp 18  SpO2 100% Physical Exam Physical Exam  Nursing note and vitals reviewed. Constitutional: Well developed, well nourished, non-toxic, and in no acute distress Head: Normocephalic and atraumatic.  Mouth/Throat: Oropharynx is clear and moist.  Neck: Normal range of motion. Neck supple.  Cardiovascular: Normal rate and regular rhythm.   Pulmonary/Chest: Effort normal and breath sounds normal.  Abdominal: Soft. There is LUQ and epigastric tenderness. There is no rebound and no guarding.  Musculoskeletal: Normal range  of motion.  Neurological: Alert, no facial droop, fluent speech, moves all extremities symmetrically Skin: Skin is warm and dry.  Psychiatric: Cooperative  ED Course  Procedures (including critical care time) Labs Review Labs Reviewed  COMPREHENSIVE METABOLIC PANEL - Abnormal; Notable for the following:    Potassium 3.4 (*)    Calcium 8.7 (*)    AST 44 (*)    All other components within normal limits  LIPASE, BLOOD - Abnormal; Notable for the following:    Lipase 256 (*)    All other components within normal limits  CBC WITH DIFFERENTIAL/PLATELET - Abnormal; Notable for the following:    RBC 3.57 (*)    Hemoglobin 11.3 (*)    HCT 32.7 (*)    All other components within normal limits    Imaging Review No results found. I have personally reviewed and evaluated these images  and lab results as part of my medical decision-making.   EKG Interpretation None      MDM   Final diagnoses:  Epigastric abdominal pain  Alcohol-induced acute pancreatitis without infection or necrosis    45 year old male with history of chronic abdominal pain, recurrent pancreatitis, and alcohol abuse who presents with epigastric abdominal pain. Was recently in the emergency department 2 times earlier over the course of the past day for epigastric abdominal pain. He had blood work concerning for pancreatitis with an elevated lipase of 340. His vital signs are within normal limits, and he is a soft and benign abdomen. Not expecting any complications or acute surgical/infectious etiology of symptoms. His repeat lipase is trending down to 250. He appears very comfortable, has no further vomiting in the emergency department. Did give initial dose of fentanyl, Zofran, Toradol, and IV fluids. Able to tolerate ginger ale, water, and crackers here. At this time I do not feel that he requires admission for his pancreatitis. Discharged in stable condition.     Forde Dandy, MD 09/13/15 1114

## 2015-09-14 MED ORDER — LABETALOL HCL 5 MG/ML IV SOLN
20.0000 mg | Freq: Once | INTRAVENOUS | Status: AC
  Administered 2015-09-14: 20 mg via INTRAVENOUS
  Filled 2015-09-14: qty 4

## 2015-09-14 MED ORDER — KETOROLAC TROMETHAMINE 30 MG/ML IJ SOLN
15.0000 mg | Freq: Once | INTRAMUSCULAR | Status: AC
  Administered 2015-09-14: 15 mg via INTRAVENOUS
  Filled 2015-09-14: qty 1

## 2015-09-14 MED ORDER — LORAZEPAM 2 MG/ML IJ SOLN
1.0000 mg | Freq: Once | INTRAMUSCULAR | Status: AC
  Administered 2015-09-14: 1 mg via INTRAVENOUS
  Filled 2015-09-14: qty 1

## 2015-09-14 NOTE — Discharge Instructions (Signed)
The inflammation of her pancreas is continuing to decrease. Continue on a fluid diet for the next few days until you feel better. I encourage you to continue not to drink alcohol. Please follow-up with your primary physician in regards to today's visit. Please take your blood pressure medicine as directed. Return to ER for any new or worsening symptoms, any additional concerns.

## 2015-10-04 ENCOUNTER — Emergency Department (HOSPITAL_COMMUNITY)
Admission: EM | Admit: 2015-10-04 | Discharge: 2015-10-04 | Disposition: A | Discharge status: Home / Self Care | Payer: No Typology Code available for payment source | Attending: Emergency Medicine | Admitting: Emergency Medicine

## 2015-10-04 ENCOUNTER — Encounter (HOSPITAL_COMMUNITY): Payer: Self-pay | Admitting: *Deleted

## 2015-10-04 DIAGNOSIS — F329 Major depressive disorder, single episode, unspecified: Secondary | ICD-10-CM | POA: Insufficient documentation

## 2015-10-04 DIAGNOSIS — F4310 Post-traumatic stress disorder, unspecified: Secondary | ICD-10-CM | POA: Insufficient documentation

## 2015-10-04 DIAGNOSIS — Z791 Long term (current) use of non-steroidal anti-inflammatories (NSAID): Secondary | ICD-10-CM | POA: Insufficient documentation

## 2015-10-04 DIAGNOSIS — I10 Essential (primary) hypertension: Secondary | ICD-10-CM | POA: Insufficient documentation

## 2015-10-04 DIAGNOSIS — Z79899 Other long term (current) drug therapy: Secondary | ICD-10-CM | POA: Insufficient documentation

## 2015-10-04 DIAGNOSIS — K219 Gastro-esophageal reflux disease without esophagitis: Secondary | ICD-10-CM | POA: Insufficient documentation

## 2015-10-04 DIAGNOSIS — K2970 Gastritis, unspecified, without bleeding: Secondary | ICD-10-CM | POA: Insufficient documentation

## 2015-10-04 DIAGNOSIS — Z79891 Long term (current) use of opiate analgesic: Secondary | ICD-10-CM | POA: Insufficient documentation

## 2015-10-04 DIAGNOSIS — R1013 Epigastric pain: Secondary | ICD-10-CM

## 2015-10-04 DIAGNOSIS — J45909 Unspecified asthma, uncomplicated: Secondary | ICD-10-CM | POA: Insufficient documentation

## 2015-10-04 DIAGNOSIS — F17210 Nicotine dependence, cigarettes, uncomplicated: Secondary | ICD-10-CM | POA: Insufficient documentation

## 2015-10-04 DIAGNOSIS — Z7951 Long term (current) use of inhaled steroids: Secondary | ICD-10-CM | POA: Insufficient documentation

## 2015-10-04 LAB — CBC
HCT: 36.9 % — ABNORMAL LOW (ref 39.0–52.0)
HEMOGLOBIN: 12.8 g/dL — AB (ref 13.0–17.0)
MCH: 31.9 pg (ref 26.0–34.0)
MCHC: 34.7 g/dL (ref 30.0–36.0)
MCV: 92 fL (ref 78.0–100.0)
PLATELETS: 238 10*3/uL (ref 150–400)
RBC: 4.01 MIL/uL — AB (ref 4.22–5.81)
RDW: 13.7 % (ref 11.5–15.5)
WBC: 7.7 10*3/uL (ref 4.0–10.5)

## 2015-10-04 LAB — BASIC METABOLIC PANEL
ANION GAP: 14 (ref 5–15)
BUN: 8 mg/dL (ref 6–20)
CALCIUM: 9.2 mg/dL (ref 8.9–10.3)
CO2: 20 mmol/L — ABNORMAL LOW (ref 22–32)
Chloride: 101 mmol/L (ref 101–111)
Creatinine, Ser: 0.57 mg/dL — ABNORMAL LOW (ref 0.61–1.24)
GFR calc Af Amer: >60 mL/min (ref >60)
GLUCOSE: 67 mg/dL (ref 65–99)
POTASSIUM: 3.8 mmol/L (ref 3.5–5.1)
SODIUM: 135 mmol/L (ref 135–145)

## 2015-10-04 LAB — LIPASE, BLOOD: LIPASE: 39 U/L (ref 11–51)

## 2015-10-04 MED ORDER — ONDANSETRON 4 MG PO TBDP
8.0000 mg | ORAL_TABLET | Freq: Once | ORAL | Status: AC
  Administered 2015-10-04: 8 mg via ORAL
  Filled 2015-10-04: qty 2

## 2015-10-04 MED ORDER — HYDROCODONE-ACETAMINOPHEN 5-325 MG PO TABS
2.0000 | ORAL_TABLET | Freq: Once | ORAL | Status: AC
Start: 2015-10-04 — End: 2015-10-04
  Administered 2015-10-04: 2 via ORAL
  Filled 2015-10-04: qty 2

## 2015-10-04 MED ORDER — FAMOTIDINE 20 MG PO TABS
20.0000 mg | ORAL_TABLET | Freq: Once | ORAL | Status: AC
  Administered 2015-10-04: 20 mg via ORAL
  Filled 2015-10-04: qty 1

## 2015-10-04 MED ORDER — ALUM & MAG HYDROXIDE-SIMETH 200-200-20 MG/5ML PO SUSP
15.0000 mL | Freq: Once | ORAL | Status: AC
  Administered 2015-10-04: 15 mL via ORAL
  Filled 2015-10-04: qty 30

## 2015-10-04 NOTE — ED Provider Notes (Signed)
CSN: [id number]     Arrival date & time 10/04/15  1245 History   First MD Initiated Contact with Patient 10/04/15 1250     Chief Complaint  Patient presents with  . Abdominal Pain     (Consider location/radiation/quality/duration/timing/severity/associated sxs/prior Treatment) Patient is a 45 y.o. male presenting with abdominal pain. The history is provided by the patient.  Abdominal Pain Associated symptoms: nausea   Associated symptoms: no chest pain, no fever, no shortness of breath, no sore throat and no vomiting   Patient w hx pancreatitis, etoh abuse, presents w epigastric pain for past day. Pain constant, dull, moderate-severe, non radiating, worse w palpation. Nausea. No vomiting or diarrhea. Had normal bm today. No abd distension. Pt feels may be similar to prior pancreatitis. + etoh use last night. Denies chest pain or sob. No back or flank pain. No fever or chills.       Past Medical History  Diagnosis Date  . Hypertension   . Asthma   . Pancreatitis   . Cocaine abuse   . Depression   . H/O suicide attempt 10/2012  . Heart murmur     "when he was little" (03/06/2013)  . Anemia   . H/O hiatal hernia   . GERD (gastroesophageal reflux disease)   . Anxiety   . WPW (Wolff-Parkinson-White syndrome)     [name] 03/06/2013  . High cholesterol   . Femoral condyle fracture ([place]) 03/08/2014    left medial/notes 03/09/2014  . Alcoholism /alcohol abuse ([place])   . Family history of adverse reaction to anesthesia     "grandmother gets confused"  . Shortness of breath     "can happen at anytime" (03/06/2013)  . Pneumonia 1990's X 3  . Chronic bronchitis ([place])   . Sickle cell trait ([place])   . History of blood transfusion 10/2012    "when I tried to commit suicide"  . History of stomach ulcers   . Migraine     "a few times/year" (03/26/2015)  . Arthritis     "knees; arms; elbows" (03/26/2015)  . Chronic lower back pain   . Bipolar disorder ([place])   . PTSD (post-traumatic  stress disorder)   . Marijuana abuse, continuous    Past Surgical History  Procedure Laterality Date  . Facial fracture surgery Left 1990's    "result of trauma"   . Eye surgery Left 1990's    "result of trauma"   . Left heart catheterization with coronary angiogram Right 03/07/2013    Procedure: LEFT HEART CATHETERIZATION WITH CORONARY ANGIOGRAM;  Surgeon: [name], MD;  Location: [facility];  Service: Cardiovascular;  Laterality: Right;  . Cardiac catheterization    . Fracture surgery    . Umbilical hernia repair    . Hernia repair     Family History  Problem Relation Age of Onset  . Hypertension Other   . Coronary artery disease Other    Social History  Substance Use Topics  . Smoking status: Current Every Day Smoker -- 1.00 packs/day for 30 years    Types: Cigarettes  . Smokeless tobacco: Current User    Types: Chew  . Alcohol Use: 37.8 oz/week    63 Cans of beer per week    Review of Systems  Constitutional: Negative for fever.  HENT: Negative for sore throat.   Eyes: Negative for redness.  Respiratory: Negative for shortness of breath.   Cardiovascular: Negative for chest pain.  Gastrointestinal: Positive for nausea and abdominal pain. Negative  for vomiting.  Genitourinary: Negative for flank pain.  Musculoskeletal: Negative for back pain and neck pain.  Skin: Negative for rash.  Neurological: Negative for headaches.  Hematological: Does not bruise/bleed easily.  Psychiatric/Behavioral: Negative for confusion.      Allergies  Shellfish-derived products and Trazodone and nefazodone  Home Medications   Prior to Admission medications   Medication Sig Start Date End Date Taking? Authorizing Provider  albuterol (PROVENTIL HFA;VENTOLIN HFA) 108 (90 BASE) MCG/ACT inhaler Inhale 2 puffs into the lungs every 6 (six) hours as needed for wheezing or shortness of breath (wheezing).     Historical Provider, MD  fluticasone (FLONASE) 50 MCG/ACT nasal spray Place  1 spray into both nostrils daily.    Historical Provider, MD  ibuprofen (ADVIL,MOTRIN) 200 MG tablet Take 200-800 mg by mouth every 6 (six) hours as needed for moderate pain.    Historical Provider, MD  lipase/protease/amylase (CREON) 12000 UNITS CPEP capsule Take 2 capsules (24,000 Units total) by mouth 3 (three) times daily with meals. 01/15/15   [name], MD  metoprolol tartrate (LOPRESSOR) 25 MG tablet Take 1 tablet (25 mg total) by mouth 2 (two) times daily. 01/15/15   [name], MD  ondansetron (ZOFRAN-ODT) 4 MG disintegrating tablet Take 1 tablet (4 mg total) by mouth every 8 (eight) hours as needed for nausea or vomiting. 8mg  ODT q4 hours prn nausea Patient taking differently: Take 4 mg by mouth every 8 (eight) hours as needed for nausea or vomiting.  09/12/15   [name], MD  oxyCODONE-acetaminophen (PERCOCET/ROXICET) 5-325 MG tablet Take 1 tablet by mouth every 6 (six) hours as needed for severe pain. 08/26/15   Dalia Heading, PA-C  promethazine (PHENERGAN) 25 MG tablet Take 1 tablet (25 mg total) by mouth every 8 (eight) hours as needed for nausea or vomiting. 08/26/15   [name], PA-C  QUEtiapine (SEROQUEL) 50 MG tablet Take 100 mg by mouth at bedtime. 01/15/15   Historical Provider, MD  ranitidine (ZANTAC) 150 MG tablet Take 1 tablet (150 mg total) by mouth 2 (two) times daily. 08/24/15   [name], MD  sertraline (ZOLOFT) 25 MG tablet Take 75 mg by mouth daily.    Historical Provider, MD  sildenafil (VIAGRA) 50 MG tablet Take 1 tablet (50 mg total) by mouth daily as needed for erectile dysfunction. 03/31/15   Olugbemiga E Doreene Burke, MD   BP 134/108 mmHg  Pulse 120  Temp(Src) 97.7 F (36.5 C) (Oral)  Resp 20  SpO2 99% Physical Exam  Constitutional: He is oriented to person, place, and time. He appears well-developed and well-nourished. No distress.  HENT:  Mouth/Throat: Oropharynx is clear and moist.  Eyes: Conjunctivae are normal. No scleral icterus.   Neck: Neck supple. No tracheal deviation present.  Cardiovascular: Normal rate, regular rhythm, normal heart sounds and intact distal pulses.  Exam reveals no gallop and no friction rub.   No murmur heard. Pulmonary/Chest: Effort normal and breath sounds normal. No accessory muscle usage. No respiratory distress.  Abdominal: Soft. Bowel sounds are normal. He exhibits no distension and no mass. There is tenderness. There is no rebound and no guarding.  Epigastric tenderness.   Musculoskeletal: Normal range of motion. He exhibits no edema.  Neurological: He is alert and oriented to person, place, and time.  Steady gait.   Skin: Skin is warm and dry. He is not diaphoretic.  Psychiatric: He has a normal mood and affect.  Nursing note and vitals reviewed.   ED Course  Procedures (including critical care time) Labs Review   Results for orders placed or performed during the hospital encounter of 10/04/15  CBC  Result Value Ref Range   WBC 7.7 4.0 - 10.5 K/uL   RBC 4.01 (L) 4.22 - 5.81 MIL/uL   Hemoglobin 12.8 (L) 13.0 - 17.0 g/dL   HCT 36.9 (L) 39.0 - 52.0 %   MCV 92.0 78.0 - 100.0 fL   MCH 31.9 26.0 - 34.0 pg   MCHC 34.7 30.0 - 36.0 g/dL   RDW 13.7 11.5 - 15.5 %   Platelets 238 150 - 400 K/uL  Lipase, blood  Result Value Ref Range   Lipase 39 11 - 51 U/L  Basic metabolic panel  Result Value Ref Range   Sodium 135 135 - 145 mmol/L   Potassium 3.8 3.5 - 5.1 mmol/L   Chloride 101 101 - 111 mmol/L   CO2 20 (L) 22 - 32 mmol/L   Glucose, Bld 67 65 - 99 mg/dL   BUN 8 6 - 20 mg/dL   Creatinine, Ser 0.57 (L) 0.61 - 1.24 mg/dL   Calcium 9.2 8.9 - 10.3 mg/dL   GFR calc non Af Amer >60 >60 mL/min   GFR calc Af Amer >60 >60 mL/min   Anion gap 14 5 - 15     I have personally reviewed and evaluated these lab results as part of my medical decision-making.    MDM   pepcid po. maalox po. Zofran. Hydrocodone. Labs.  Po fluids.  Reviewed nursing notes and prior charts for  additional history.   Recheck pt comfortable. No emesis. abd soft. Nt.   Labs look good.   Patient currently appears stable for d/c.        Lajean Saver, MD 10/04/15 [phone]

## 2015-10-04 NOTE — Discharge Instructions (Signed)
It was our pleasure to provide your ER care today - we hope that you feel better.  Avoid any alcohol use.  Continue prilosec.  May try maalox and pepcid as need for symptom.  Follow up with primary care doctor in the next few days.  Return to ER if worse, new symptoms, persistent vomiting, fevers, other concern.  You were given pain medication in the ER - no driving for the next 6 hours.      Abdominal Pain, Adult Many things can cause abdominal pain. Usually, abdominal pain is not caused by a disease and will improve without treatment. It can often be observed and treated at home. Your health care provider will do a physical exam and possibly order blood tests and X-rays to help determine the seriousness of your pain. However, in many cases, more time must pass before a clear cause of the pain can be found. Before that point, your health care provider may not know if you need more testing or further treatment. HOME CARE INSTRUCTIONS Monitor your abdominal pain for any changes. The following actions may help to alleviate any discomfort you are experiencing:  Only take over-the-counter or prescription medicines as directed by your health care provider.  Do not take laxatives unless directed to do so by your health care provider.  Try a clear liquid diet (broth, tea, or water) as directed by your health care provider. Slowly move to a bland diet as tolerated. SEEK MEDICAL CARE IF:  You have unexplained abdominal pain.  You have abdominal pain associated with nausea or diarrhea.  You have pain when you urinate or have a bowel movement.  You experience abdominal pain that wakes you in the night.  You have abdominal pain that is worsened or improved by eating food.  You have abdominal pain that is worsened with eating fatty foods.  You have a fever. SEEK IMMEDIATE MEDICAL CARE IF:  Your pain does not go away within 2 hours.  You keep throwing up (vomiting).  Your pain is  felt only in portions of the abdomen, such as the right side or the left lower portion of the abdomen.  You pass bloody or black tarry stools. MAKE SURE YOU:  Understand these instructions.  Will watch your condition.  Will get help right away if you are not doing well or get worse.   This information is not intended to replace advice given to you by your health care provider. Make sure you discuss any questions you have with your health care provider.   Document Released: 02/02/2005 Document Revised: 01/14/2015 Document Reviewed: 01/02/2013 Elsevier Interactive Patient Education 2016 Elsevier Inc.  Gastritis, Adult Gastritis is soreness and swelling (inflammation) of the lining of the stomach. Gastritis can develop as a sudden onset (acute) or long-term (chronic) condition. If gastritis is not treated, it can lead to stomach bleeding and ulcers. CAUSES  Gastritis occurs when the stomach lining is weak or damaged. Digestive juices from the stomach then inflame the weakened stomach lining. The stomach lining may be weak or damaged due to viral or bacterial infections. One common bacterial infection is the Helicobacter pylori infection. Gastritis can also result from excessive alcohol consumption, taking certain medicines, or having too much acid in the stomach.  SYMPTOMS  In some cases, there are no symptoms. When symptoms are present, they may include:  Pain or a burning sensation in the upper abdomen.  Nausea.  Vomiting.  An uncomfortable feeling of fullness after eating. DIAGNOSIS  Your  caregiver may suspect you have gastritis based on your symptoms and a physical exam. To determine the cause of your gastritis, your caregiver may perform the following:  Blood or stool tests to check for the H pylori bacterium.  Gastroscopy. A thin, flexible tube (endoscope) is passed down the esophagus and into the stomach. The endoscope has a light and camera on the end. Your caregiver uses the  endoscope to view the inside of the stomach.  Taking a tissue sample (biopsy) from the stomach to examine under a microscope. TREATMENT  Depending on the cause of your gastritis, medicines may be prescribed. If you have a bacterial infection, such as an H pylori infection, antibiotics may be given. If your gastritis is caused by too much acid in the stomach, H2 blockers or antacids may be given. Your caregiver may recommend that you stop taking aspirin, ibuprofen, or other nonsteroidal anti-inflammatory drugs (NSAIDs). HOME CARE INSTRUCTIONS  Only take over-the-counter or prescription medicines as directed by your caregiver.  If you were given antibiotic medicines, take them as directed. Finish them even if you start to feel better.  Drink enough fluids to keep your urine clear or pale yellow.  Avoid foods and drinks that make your symptoms worse, such as:  Caffeine or alcoholic drinks.  Chocolate.  Peppermint or mint flavorings.  Garlic and onions.  Spicy foods.  Citrus fruits, such as oranges, lemons, or limes.  Tomato-based foods such as sauce, chili, salsa, and pizza.  Fried and fatty foods.  Eat small, frequent meals instead of large meals. SEEK IMMEDIATE MEDICAL CARE IF:   You have black or dark red stools.  You vomit blood or material that looks like coffee grounds.  You are unable to keep fluids down.  Your abdominal pain gets worse.  You have a fever.  You do not feel better after 1 week.  You have any other questions or concerns. MAKE SURE YOU:  Understand these instructions.  Will watch your condition.  Will get help right away if you are not doing well or get worse.   This information is not intended to replace advice given to you by your health care provider. Make sure you discuss any questions you have with your health care provider.   Document Released: 04/19/2001 Document Revised: 10/25/2011 Document Reviewed: 06/08/2011 Elsevier Interactive  Patient Education 2016 [employer].  Alcohol Use Disorder Alcohol use disorder is a mental disorder. It is not a one-time incident of heavy drinking. Alcohol use disorder is the excessive and uncontrollable use of alcohol over time that leads to problems with functioning in one or more areas of daily living. People with this disorder risk harming themselves and others when they drink to excess. Alcohol use disorder also can cause other mental disorders, such as mood and anxiety disorders, and serious physical problems. People with alcohol use disorder often misuse other drugs.  Alcohol use disorder is common and widespread. Some people with this disorder drink alcohol to cope with or escape from negative life events. Others drink to relieve chronic pain or symptoms of mental illness. People with a family history of alcohol use disorder are at higher risk of losing control and using alcohol to excess.  Drinking too much alcohol can cause injury, accidents, and health problems. One drink can be too much when you are:  Working.  Pregnant or breastfeeding.  Taking medicines. Ask your doctor.  Driving or planning to drive. SYMPTOMS  Signs and symptoms of alcohol use disorder may include  the following:   Consumption ofalcohol inlarger amounts or over a longer period of time than intended.  Multiple unsuccessful attempts to cutdown or control alcohol use.   A great deal of time spent obtaining alcohol, using alcohol, or recovering from the effects of alcohol (hangover).  A strong desire or urge to use alcohol (cravings).   Continued use of alcohol despite problems at work, school, or home because of alcohol use.   Continued use of alcohol despite problems in relationships because of alcohol use.  Continued use of alcohol in situations when it is physically hazardous, such as driving a car.  Continued use of alcohol despite awareness of a physical or psychological problem that is  likely related to alcohol use. Physical problems related to alcohol use can involve the brain, heart, liver, stomach, and intestines. Psychological problems related to alcohol use include intoxication, depression, anxiety, psychosis, delirium, and dementia.   The need for increased amounts of alcohol to achieve the same desired effect, or a decreased effect from the consumption of the same amount of alcohol (tolerance).  Withdrawal symptoms upon reducing or stopping alcohol use, or alcohol use to reduce or avoid withdrawal symptoms. Withdrawal symptoms include:  Racing heart.  Hand tremor.  Difficulty sleeping.  Nausea.  Vomiting.  Hallucinations.  Restlessness.  Seizures. DIAGNOSIS Alcohol use disorder is diagnosed through an assessment by your health care provider. Your health care provider may start by asking three or four questions to screen for excessive or problematic alcohol use. To confirm a diagnosis of alcohol use disorder, at least two symptoms must be present within a 22-month period. The severity of alcohol use disorder depends on the number of symptoms:  Mild--two or three.  Moderate--four or five.  Severe--six or more. Your health care provider may perform a physical exam or use results from lab tests to see if you have physical problems resulting from alcohol use. Your health care provider may refer you to a mental health professional for evaluation. TREATMENT  Some people with alcohol use disorder are able to reduce their alcohol use to low-risk levels. Some people with alcohol use disorder need to quit drinking alcohol. When necessary, mental health professionals with specialized training in substance use treatment can help. Your health care provider can help you decide how severe your alcohol use disorder is and what type of treatment you need. The following forms of treatment are available:   Detoxification. Detoxification involves the use of prescription  medicines to prevent alcohol withdrawal symptoms in the first week after quitting. This is important for people with a history of symptoms of withdrawal and for heavy drinkers who are likely to have withdrawal symptoms. Alcohol withdrawal can be dangerous and, in severe cases, cause death. Detoxification is usually provided in a hospital or in-patient substance use treatment facility.  Counseling or talk therapy. Talk therapy is provided by substance use treatment counselors. It addresses the reasons people use alcohol and ways to keep them from drinking again. The goals of talk therapy are to help people with alcohol use disorder find healthy activities and ways to cope with life stress, to identify and avoid triggers for alcohol use, and to handle cravings, which can cause relapse.  Medicines.Different medicines can help treat alcohol use disorder through the following actions:  Decrease alcohol cravings.  Decrease the positive reward response felt from alcohol use.  Produce an uncomfortable physical reaction when alcohol is used (aversion therapy).  Support groups. Support groups are run by people who have  quit drinking. They provide emotional support, advice, and guidance. These forms of treatment are often combined. Some people with alcohol use disorder benefit from intensive combination treatment provided by specialized substance use treatment centers. Both inpatient and outpatient treatment programs are available.   This information is not intended to replace advice given to you by your health care provider. Make sure you discuss any questions you have with your health care provider.   Document Released: 06/02/2004 Document Revised: 05/16/2014 Document Reviewed: 08/02/2012 Elsevier Interactive Patient Education 2016 [facility] The [employer] 211 is a great source of information about community services  available.  Access by dialing 2-1-1 from anywhere in [place], or by website -  CustodianSupply.fi.   Other Local Resources (Updated 05/2015)  [facility]  Crisis Hotline, available 24 hours a day, 7 days a week: [place], [place]   [facility]  Crisis Hotline, available 24 hours a day, 7 days a week: [place], [place]  [facility]  Suicide Prevention Hotline, available 24 hours a day, 7 days a week: [place], [place], available 24 hours a day, 7 days a week: [place], [place] Access to [employer], available 24 hours a day, 7 days a week: 251-438-8414 All   [facility]  Crisis Hotline, available 24 hours a day, 7 days a week: 630-061-5686 All   Other Local Resources (Updated 05/2015)  Outpatient Counseling/ Substance Abuse Programs  Services     Address and Phone Number  ADS (Alcohol and Drug Services)   Options include Individual counseling, group counseling, intensive outpatient program (several hours a day, several days a week)  Offers depression assessments  Provides methadone maintenance program [phone] [address], [place], [place] partial hospitalization/day treatment and DUI/DWI programs  [employer], private insurance [phone] [address], [phone] [place], [place] [address]  [place] include intensive outpatient program (several hours a day, several days a week), outpatient treatment, DUI/DWI services, family education  Also has some services specifically for [employer] transitional housing  [phone] [address] [place], [place] [address]     [place] Medicare, private pay, and private insurance  [phone] [address], [place], [place] [address]  [facility]  Services include individual counseling, substance abuse intensive outpatient program (several hours a day, several days a week), day treatment  Blinda [place]ather[place], Medicaid, private insurance [phone] [address], [place], [place] [address]  [facility]   Offers substance abuse intensive outpatient program (several hours a day, several days a week), partial hospitalization program [phone] [address] [place], [place] [address]  [phone] [address], [place] [address]  [phone] [place], [place] [address]  [phone], [place], [place] [address]  [facility]  Individual counseling only  Accepts private insurance only [phone] [address], [place], [place] [address]  [facility]  Methadone maintenance program [id number] [place], [place] [address]  [facility] providing substance abuse and mental health counseling  Accepts Medicaid, Medicare, private insurance  Offers sliding scale for uninsured [phone] [place], [place] in [place] individual counseling, and intensive in-home services [phone] [address] [place] [address] [place], [place] [address]  [facility] individual counseling, family counseling, group therapy, domestic violence counseling, consumer credit counseling  Accepts Medicare, Medicaid, private insurance  Offers sliding scale for uninsured [phone] [address] [place], [place] [address]  [phone] [facility], [address] [place], [place]  [facility]  Offers individual, family and group counseling  3 locations - [place], [place], and  [place]  [address] [place], [place] [address]  [address] [place], [place] [address]  [place], [place] [address]  [facility]    Offers psychiatric assessment, 8-week Intensive Outpatient Program (several hours a day, several times a week, daytime or evenings), early recovery group, family Program, medication management  Private pay or private insurance only [phone], or  [phone] [address] [place], [place] [address]  [facility]  Offers individual, couples and family counseling  Accepts Medicaid, private insurance, and sliding scale for uninsured [phone] [address] [place], [place] [address]  Launa Flight, MD  Individual counseling  Private insurance [phone] [place], [place] [address]  [facility]   Offers assessment, substance abuse treatment, and behavioral health treatment [phone] [address] [place], [place] [address]  [place]  Individual counseling  Accepts private insurance [phone] [place], [place] [address]  [facility]  Individual counseling  Blinda Leatherwood, private insurance [phone] [place], [place] [address]  [facility]    Offers intensive outpatient program (several hours a day, several times a week)  Private pay, private insurance [phone] [place], [place]  Individual counseling  Medicare, private insurance [phone] [address] [place], [place] [address]  [place]    Offers intensive outpatient program (several hours a day, several times a week) and partial hospitalization program [phone] [place], [place] [address]  Letta Moynahan, MD  Individual counseling [phone] [address] [place], [place] [address]  [place] counseling to individuals, couples, and families  Accepts Medicare and private insurance; offers sliding scale for uninsured [phone] [place], [place] [address]  [facility]  Christian counseling [phone] [address], [place], [place] [address]  [facility] [employer] crisis counseling, individual counseling, group therapy, in-home therapy, domestic violence services, day treatment, DWI services, Conservation officer, nature (CST), Assertive Community Treatment Team (ACTT), substance abuse Intensive Outpatient Program (several hours a day, several times a week)  2 locations - [place] and [place], [place] [address]  [phone] [address] [place], [place] [address]  [place] counseling and group therapy  [place] insurance, [place], [place] [phone] [address] [place], [place]  [facility]  Offers individual and family counseling  Offers LGBTQ services  Accepts private insurance and private pay [phone] [place], [name] [address]  [facility]    Offers individual counseling, group therapy, and outpatient detox  Accepts private insurance [id number] [place], [place] and [place] Medicare,  private insurance [phone] [address] [place], [place] [address]  [employer]  Individual counseling  [employer], private insurance [phone] [address], [place] [address]  [facility] [place] substance abuse Intensive Outpatient Program (several hours a day, several times a week) [phone], or [place], [place]

## 2015-10-04 NOTE — ED Notes (Signed)
Pt reports having abd pain x 1 hour, mid abd pain. Hx of pancreatitis and reports +etoh use last night.

## 2015-10-06 ENCOUNTER — Emergency Department (HOSPITAL_COMMUNITY)
Admission: EM | Admit: 2015-10-06 | Discharge: 2015-10-06 | Disposition: A | Discharge status: Home / Self Care | Payer: No Typology Code available for payment source | Attending: Emergency Medicine | Admitting: Emergency Medicine

## 2015-10-06 ENCOUNTER — Encounter (HOSPITAL_COMMUNITY): Payer: Self-pay | Admitting: Emergency Medicine

## 2015-10-06 DIAGNOSIS — G8929 Other chronic pain: Secondary | ICD-10-CM | POA: Insufficient documentation

## 2015-10-06 DIAGNOSIS — F4310 Post-traumatic stress disorder, unspecified: Secondary | ICD-10-CM | POA: Insufficient documentation

## 2015-10-06 DIAGNOSIS — F17210 Nicotine dependence, cigarettes, uncomplicated: Secondary | ICD-10-CM | POA: Insufficient documentation

## 2015-10-06 DIAGNOSIS — Z79899 Other long term (current) drug therapy: Secondary | ICD-10-CM | POA: Insufficient documentation

## 2015-10-06 DIAGNOSIS — J45909 Unspecified asthma, uncomplicated: Secondary | ICD-10-CM | POA: Insufficient documentation

## 2015-10-06 DIAGNOSIS — I10 Essential (primary) hypertension: Secondary | ICD-10-CM | POA: Insufficient documentation

## 2015-10-06 DIAGNOSIS — R109 Unspecified abdominal pain: Secondary | ICD-10-CM

## 2015-10-06 DIAGNOSIS — R1013 Epigastric pain: Secondary | ICD-10-CM | POA: Insufficient documentation

## 2015-10-06 DIAGNOSIS — F319 Bipolar disorder, unspecified: Secondary | ICD-10-CM | POA: Insufficient documentation

## 2015-10-06 DIAGNOSIS — M170 Bilateral primary osteoarthritis of knee: Secondary | ICD-10-CM | POA: Insufficient documentation

## 2015-10-06 LAB — COMPREHENSIVE METABOLIC PANEL
ALK PHOS: 129 U/L — AB (ref 38–126)
ALT: 64 U/L — AB (ref 17–63)
AST: 128 U/L — AB (ref 15–41)
Albumin: 4.3 g/dL (ref 3.5–5.0)
Anion gap: 9 (ref 5–15)
BUN: 15 mg/dL (ref 6–20)
CALCIUM: 9.1 mg/dL (ref 8.9–10.3)
CO2: 25 mmol/L (ref 22–32)
CREATININE: 0.78 mg/dL (ref 0.61–1.24)
Chloride: 99 mmol/L — ABNORMAL LOW (ref 101–111)
GFR calc non Af Amer: >60 mL/min (ref >60)
Glucose, Bld: 108 mg/dL — ABNORMAL HIGH (ref 65–99)
Potassium: 4 mmol/L (ref 3.5–5.1)
SODIUM: 133 mmol/L — AB (ref 135–145)
Total Bilirubin: 1 mg/dL (ref 0.3–1.2)
Total Protein: 8.3 g/dL — ABNORMAL HIGH (ref 6.5–8.1)

## 2015-10-06 LAB — CBC WITH DIFFERENTIAL/PLATELET
BASOS ABS: 0 10*3/uL (ref 0.0–0.1)
Basophils Relative: 0 %
EOS ABS: 0.3 10*3/uL (ref 0.0–0.7)
Eosinophils Relative: 4 %
HCT: 37 % — ABNORMAL LOW (ref 39.0–52.0)
Hemoglobin: 12.8 g/dL — ABNORMAL LOW (ref 13.0–17.0)
LYMPHS PCT: 42 %
Lymphs Abs: 3.6 10*3/uL (ref 0.7–4.0)
MCH: 32.1 pg (ref 26.0–34.0)
MCHC: 34.6 g/dL (ref 30.0–36.0)
MCV: 92.7 fL (ref 78.0–100.0)
Monocytes Absolute: 0.9 10*3/uL (ref 0.1–1.0)
Monocytes Relative: 10 %
NEUTROS PCT: 44 %
Neutro Abs: 3.8 10*3/uL (ref 1.7–7.7)
Platelets: 214 10*3/uL (ref 150–400)
RBC: 3.99 MIL/uL — AB (ref 4.22–5.81)
RDW: 13.6 % (ref 11.5–15.5)
WBC: 8.5 10*3/uL (ref 4.0–10.5)

## 2015-10-06 LAB — LIPASE, BLOOD: Lipase: 38 U/L (ref 11–51)

## 2015-10-06 MED ORDER — METOCLOPRAMIDE HCL 5 MG/ML IJ SOLN
10.0000 mg | Freq: Once | INTRAMUSCULAR | Status: DC
  Filled 2015-10-06: qty 2

## 2015-10-06 MED ORDER — GI COCKTAIL ~~LOC~~
30.0000 mL | Freq: Once | ORAL | Status: AC
  Administered 2015-10-06: 30 mL via ORAL
  Filled 2015-10-06: qty 30

## 2015-10-06 MED ORDER — PROMETHAZINE HCL 25 MG PO TABS
25.0000 mg | ORAL_TABLET | Freq: Once | ORAL | Status: AC
  Administered 2015-10-06: 25 mg via ORAL
  Filled 2015-10-06: qty 1

## 2015-10-06 MED ORDER — SODIUM CHLORIDE 0.9 % IV BOLUS (SEPSIS)
1000.0000 mL | Freq: Once | INTRAVENOUS | Status: AC
Start: 2015-10-06 — End: 2015-10-06
  Administered 2015-10-06: 1000 mL via INTRAVENOUS

## 2015-10-06 MED ORDER — ONDANSETRON 4 MG PO TBDP
4.0000 mg | ORAL_TABLET | Freq: Once | ORAL | Status: AC
  Administered 2015-10-06: 4 mg via ORAL
  Filled 2015-10-06: qty 1

## 2015-10-06 MED ORDER — DIPHENHYDRAMINE HCL 50 MG/ML IJ SOLN
25.0000 mg | Freq: Once | INTRAMUSCULAR | Status: DC
  Filled 2015-10-06: qty 1

## 2015-10-06 MED ORDER — ONDANSETRON HCL 4 MG PO TABS: 4.0000 mg | ORAL_TABLET | Freq: Four times a day (QID) | ORAL | Status: DC

## 2015-10-06 MED ORDER — HYDROCODONE-ACETAMINOPHEN 5-325 MG PO TABS
2.0000 | ORAL_TABLET | Freq: Once | ORAL | Status: AC
Start: 2015-10-06 — End: 2015-10-06
  Administered 2015-10-06: 2 via ORAL
  Filled 2015-10-06: qty 2

## 2015-10-06 NOTE — ED Notes (Signed)
Fluids provided.

## 2015-10-06 NOTE — ED Provider Notes (Signed)
CSN: [id number]     Arrival date & time 10/06/15  0127 History   First MD Initiated Contact with Patient 10/06/15 0215     Chief Complaint  Patient presents with  . Abdominal Pain   HPI  [name] is a 45 year old male well known to this ED with multiple presentations for abdominal pain presenting with abdominal pain. Patient reports 2 day history of epigastric abdominal pain consistent with his pancreatitis symptoms. He states the pain is dull and aching. The pain does not radiate. Endorses nausea without vomiting. States he has also had a few loose stools. Denies blood in his stool. He reports he has taken Carafate for his symptoms without relief. States he had alcohol 2 days ago but none in the last 24 hours. Patient was evaluated in the emergency department yesterday for similar complaint and discharged home. Patient has a care plan in place for chronic abdominal pain related to pancreatitis. Patient states that his symptoms today are typical of his acute worsening of chronic pancreatitis. He has no other new complaints today.  Past Medical History  Diagnosis Date  . Hypertension   . Asthma   . Pancreatitis   . Cocaine abuse   . Depression   . H/O suicide attempt 10/2012  . Heart murmur     "when he was little" (03/06/2013)  . Anemia   . H/O hiatal hernia   . GERD (gastroesophageal reflux disease)   . Anxiety   . WPW (Wolff-Parkinson-White syndrome)     [name] 03/06/2013  . High cholesterol   . Femoral condyle fracture ([place]) 03/08/2014    left medial/notes 03/09/2014  . Alcoholism /alcohol abuse ([place])   . Family history of adverse reaction to anesthesia     "grandmother gets confused"  . Shortness of breath     "can happen at anytime" (03/06/2013)  . Pneumonia 1990's X 3  . Chronic bronchitis ([place])   . Sickle cell trait ([place])   . History of blood transfusion 10/2012    "when I tried to commit suicide"  . History of stomach ulcers   . Migraine     "a few times/year"  (03/26/2015)  . Arthritis     "knees; arms; elbows" (03/26/2015)  . Chronic lower back pain   . Bipolar disorder ([place])   . PTSD (post-traumatic stress disorder)   . Marijuana abuse, continuous    Past Surgical History  Procedure Laterality Date  . Facial fracture surgery Left 1990's    "result of trauma"   . Eye surgery Left 1990's    "result of trauma"   . Left heart catheterization with coronary angiogram Right 03/07/2013    Procedure: LEFT HEART CATHETERIZATION WITH CORONARY ANGIOGRAM;  Surgeon: [name], MD;  Location: [facility];  Service: Cardiovascular;  Laterality: Right;  . Cardiac catheterization    . Fracture surgery    . Umbilical hernia repair    . Hernia repair     Family History  Problem Relation Age of Onset  . Hypertension Other   . Coronary artery disease Other    Social History  Substance Use Topics  . Smoking status: Current Every Day Smoker -- 1.00 packs/day for 30 years    Types: Cigarettes  . Smokeless tobacco: Current User    Types: Chew  . Alcohol Use: 37.8 oz/week    63 Cans of beer per week    Review of Systems  All other systems reviewed and are negative.  Allergies  Shellfish-derived products and Trazodone and nefazodone  Home Medications   Prior to Admission medications   Medication Sig Start Date End Date Taking? Authorizing Provider  albuterol (PROVENTIL HFA;VENTOLIN HFA) 108 (90 BASE) MCG/ACT inhaler Inhale 2 puffs into the lungs every 6 (six) hours as needed for wheezing or shortness of breath (wheezing).    Yes Historical Provider, MD  fluticasone (FLONASE) 50 MCG/ACT nasal spray Place 1 spray into both nostrils daily.   Yes Historical Provider, MD  ibuprofen (ADVIL,MOTRIN) 200 MG tablet Take 200-800 mg by mouth every 6 (six) hours as needed for moderate pain.   Yes Historical Provider, MD  lipase/protease/amylase (CREON) 12000 UNITS CPEP capsule Take 2 capsules (24,000 Units total) by mouth 3 (three) times daily with  meals. 01/15/15  Yes [name], MD  metoprolol tartrate (LOPRESSOR) 25 MG tablet Take 1 tablet (25 mg total) by mouth 2 (two) times daily. 01/15/15  Yes [name], MD  omeprazole (PRILOSEC) 20 MG capsule Take 20 mg by mouth daily.   Yes Historical Provider, MD  ondansetron (ZOFRAN-ODT) 4 MG disintegrating tablet Take 1 tablet (4 mg total) by mouth every 8 (eight) hours as needed for nausea or vomiting. 8mg  ODT q4 hours prn nausea Patient taking differently: Take 4 mg by mouth every 8 (eight) hours as needed for nausea or vomiting.  09/12/15  Yes [name], MD  oxyCODONE-acetaminophen (PERCOCET/ROXICET) 5-325 MG tablet Take 1 tablet by mouth every 6 (six) hours as needed for severe pain. 08/26/15  Yes [name], PA-C  promethazine (PHENERGAN) 25 MG tablet Take 1 tablet (25 mg total) by mouth every 8 (eight) hours as needed for nausea or vomiting. 08/26/15  Yes [name], PA-C  QUEtiapine (SEROQUEL) 50 MG tablet Take 100 mg by mouth at bedtime. 01/15/15  Yes Historical Provider, MD  sertraline (ZOLOFT) 25 MG tablet Take 75 mg by mouth daily.   Yes Historical Provider, MD  sildenafil (VIAGRA) 50 MG tablet Take 1 tablet (50 mg total) by mouth daily as needed for erectile dysfunction. 03/31/15  Yes [name], MD  sucralfate (CARAFATE) 1 g tablet Take 1 g by mouth 4 (four) times daily -  with meals and at bedtime.   Yes Historical Provider, MD  ondansetron (ZOFRAN) 4 MG tablet Take 1 tablet (4 mg total) by mouth every 6 (six) hours. 10/06/15   Azrael Huss, PA-C  ranitidine (ZANTAC) 150 MG tablet Take 1 tablet (150 mg total) by mouth 2 (two) times daily. 08/24/15   [name], MD   BP 113/83 mmHg  Pulse 107  Temp(Src) 98.1 F (36.7 C) (Oral)  Resp 20  SpO2 98% Physical Exam  Constitutional: He appears well-developed and well-nourished. No distress.  Nontoxic-appearing  HENT:  Head: Normocephalic and atraumatic.  Eyes: Conjunctivae are normal. Right eye  exhibits no discharge. Left eye exhibits no discharge. No scleral icterus.  Neck: Normal range of motion.  Cardiovascular: Regular rhythm and normal heart sounds.   Mildly tachycardic  Pulmonary/Chest: Effort normal and breath sounds normal. No respiratory distress.  Abdominal: Soft. He exhibits no distension. There is tenderness. There is no rebound and no guarding.  Mild epigastric tenderness to palpation. No peritoneal signs.  Musculoskeletal: Normal range of motion.  Neurological: He is alert. Coordination normal.  Skin: Skin is warm and dry.  Psychiatric: He has a normal mood and affect. His behavior is normal.  Nursing note and vitals reviewed.   ED Course  Procedures (including critical care time) Labs  Review Labs Reviewed  CBC WITH DIFFERENTIAL/PLATELET - Abnormal; Notable for the following:    RBC 3.99 (*)    Hemoglobin 12.8 (*)    HCT 37.0 (*)    All other components within normal limits  COMPREHENSIVE METABOLIC PANEL - Abnormal; Notable for the following:    Sodium 133 (*)    Chloride 99 (*)    Glucose, Bld 108 (*)    Total Protein 8.3 (*)    AST 128 (*)    ALT 64 (*)    Alkaline Phosphatase 129 (*)    All other components within normal limits  LIPASE, BLOOD    Imaging Review No results found. I have personally reviewed and evaluated these images and lab results as part of my medical decision-making.   EKG Interpretation None      MDM   Final diagnoses:  Epigastric pain  Chronic abdominal pain   45 year old male well-known to emergency department presenting with acute worsening of chronic abdominal pain related to alcoholic pancreatitis. Afebrile and hemodynamically stable. Mildly tachycardic. Abdomen is soft with mild epigastric tenderness. No peritoneal signs. No leukocytosis. Hemoglobin at baseline. Very mild hyponatremia and hypochloremia. Alkaline phosphatase, AST and ALT are all elevated. Chart review shows that he has a long history of elevated  LFTs. Likely related to his chronic alcoholism. No significant changes on blood work when compared to past visits. No indication for imaging at this time. Fluids, pain control and nausea control given in emergency department. Patient is tolerating PO. Strongly encouraged patient to follow-up with his PCP and avoid alcohol. Return precautions given in discharge paperwork and discussed with pt at bedside. Pt stable for discharge     Josephina Gip, PA-C [id number]  Delora Fuel, MD [id number] [id number]

## 2015-10-06 NOTE — ED Notes (Addendum)
Pt BIB EMS for c/o abdominal pain; pt was seen yesterday for same complaint and dx with gastritis but states the pain is now worse; pt c/o N/VD; no distention noted; pt denies ETOH since this Saturday.

## 2015-10-06 NOTE — Discharge Instructions (Signed)

## 2015-10-18 ENCOUNTER — Encounter (HOSPITAL_COMMUNITY): Payer: Self-pay | Admitting: Emergency Medicine

## 2015-10-18 ENCOUNTER — Emergency Department (HOSPITAL_COMMUNITY)
Admission: EM | Admit: 2015-10-18 | Discharge: 2015-10-18 | Disposition: A | Discharge status: Home / Self Care | Payer: No Typology Code available for payment source | Attending: Emergency Medicine | Admitting: Emergency Medicine

## 2015-10-18 DIAGNOSIS — F17210 Nicotine dependence, cigarettes, uncomplicated: Secondary | ICD-10-CM | POA: Insufficient documentation

## 2015-10-18 DIAGNOSIS — K861 Other chronic pancreatitis: Secondary | ICD-10-CM

## 2015-10-18 DIAGNOSIS — I10 Essential (primary) hypertension: Secondary | ICD-10-CM | POA: Insufficient documentation

## 2015-10-18 DIAGNOSIS — J45909 Unspecified asthma, uncomplicated: Secondary | ICD-10-CM | POA: Insufficient documentation

## 2015-10-18 DIAGNOSIS — Z79899 Other long term (current) drug therapy: Secondary | ICD-10-CM | POA: Insufficient documentation

## 2015-10-18 LAB — COMPREHENSIVE METABOLIC PANEL
ALT: 42 U/L (ref 17–63)
AST: 49 U/L — ABNORMAL HIGH (ref 15–41)
Albumin: 3.6 g/dL (ref 3.5–5.0)
Alkaline Phosphatase: 110 U/L (ref 38–126)
Anion gap: 10 (ref 5–15)
BILIRUBIN TOTAL: 0.7 mg/dL (ref 0.3–1.2)
BUN: 9 mg/dL (ref 6–20)
CHLORIDE: 102 mmol/L (ref 101–111)
CO2: 23 mmol/L (ref 22–32)
CREATININE: 0.72 mg/dL (ref 0.61–1.24)
Calcium: 8.8 mg/dL — ABNORMAL LOW (ref 8.9–10.3)
Glucose, Bld: 97 mg/dL (ref 65–99)
Potassium: 3.6 mmol/L (ref 3.5–5.1)
Sodium: 135 mmol/L (ref 135–145)
TOTAL PROTEIN: 7.2 g/dL (ref 6.5–8.1)

## 2015-10-18 LAB — CBC WITH DIFFERENTIAL/PLATELET
BASOS PCT: 0 %
Basophils Absolute: 0 10*3/uL (ref 0.0–0.1)
EOS PCT: 3 %
Eosinophils Absolute: 0.3 10*3/uL (ref 0.0–0.7)
HEMATOCRIT: 34 % — AB (ref 39.0–52.0)
Hemoglobin: 11 g/dL — ABNORMAL LOW (ref 13.0–17.0)
LYMPHS ABS: 2.7 10*3/uL (ref 0.7–4.0)
Lymphocytes Relative: 27 %
MCH: 31.2 pg (ref 26.0–34.0)
MCHC: 32.4 g/dL (ref 30.0–36.0)
MCV: 96.3 fL (ref 78.0–100.0)
MONO ABS: 1 10*3/uL (ref 0.1–1.0)
MONOS PCT: 10 %
NEUTROS PCT: 60 %
Neutro Abs: 6.1 10*3/uL (ref 1.7–7.7)
PLATELETS: 221 10*3/uL (ref 150–400)
RBC: 3.53 MIL/uL — ABNORMAL LOW (ref 4.22–5.81)
RDW: 13.3 % (ref 11.5–15.5)
WBC: 10.1 10*3/uL (ref 4.0–10.5)

## 2015-10-18 LAB — LIPASE, BLOOD: LIPASE: 48 U/L (ref 11–51)

## 2015-10-18 MED ORDER — FAMOTIDINE 20 MG PO TABS
20.0000 mg | ORAL_TABLET | Freq: Once | ORAL | Status: AC
  Administered 2015-10-18: 20 mg via ORAL
  Filled 2015-10-18: qty 1

## 2015-10-18 MED ORDER — ACETAMINOPHEN 500 MG PO TABS: 500.0000 mg | ORAL_TABLET | Freq: Once | ORAL | Status: DC

## 2015-10-18 MED ORDER — ONDANSETRON 8 MG PO TBDP: 8.0000 mg | ORAL_TABLET | Freq: Three times a day (TID) | ORAL | Status: DC | PRN

## 2015-10-18 MED ORDER — ONDANSETRON 4 MG PO TBDP
8.0000 mg | ORAL_TABLET | Freq: Once | ORAL | Status: AC
Start: 2015-10-18 — End: 2015-10-18
  Administered 2015-10-18: 8 mg via ORAL
  Filled 2015-10-18: qty 2

## 2015-10-18 MED ORDER — HYDROCODONE-ACETAMINOPHEN 5-325 MG PO TABS
1.0000 | ORAL_TABLET | Freq: Once | ORAL | Status: AC
  Administered 2015-10-18: 1 via ORAL
  Filled 2015-10-18: qty 1

## 2015-10-18 MED ORDER — GI COCKTAIL ~~LOC~~
30.0000 mL | Freq: Once | ORAL | Status: AC
  Administered 2015-10-18: 30 mL via ORAL
  Filled 2015-10-18: qty 30

## 2015-10-18 MED ORDER — SODIUM CHLORIDE 0.9 % IV BOLUS (SEPSIS): 1000.0000 mL | Freq: Once | INTRAVENOUS | Status: DC

## 2015-10-18 NOTE — ED Notes (Signed)
Pt in via [facility] with c/o abd pain. Pt has hx of pancreatitis and ETOH use, had 24oz beer last night. C/o sharp LLQ pain, tender to touch. Reports N/D but no vomitting.

## 2015-10-18 NOTE — ED Provider Notes (Signed)
CSN: [id number]     Arrival date & time 10/18/15  [id number] History   First MD Initiated Contact with Patient 10/18/15 0602     Chief Complaint  Patient presents with  . Abdominal Pain     (Consider location/radiation/quality/duration/timing/severity/associated sxs/prior Treatment) HPI [name] is a 45 y.o. male with history of hypertension, asthma, cocaine abuse, chronic pancreatitis, WPW, presents to emergency department complaining of abdominal pain. Patient states pain started yesterday. He reports drinking 2 beers and eating some chicken prior to onset of pain. He states he cut down on drinking and only has few beers once in a while. He reports some nausea and vomiting. One episode of emesis in emergency department. He also admits to diarrhea. Reports pain is epigastric. He radiates all over. Reports pain is sharp. Nothing is making payment worse. Denies any blood in his emesis or stool.  Past Medical History  Diagnosis Date  . Hypertension   . Asthma   . Pancreatitis   . Cocaine abuse   . Depression   . H/O suicide attempt 10/2012  . Heart murmur     "when he was little" (03/06/2013)  . Anemia   . H/O hiatal hernia   . GERD (gastroesophageal reflux disease)   . Anxiety   . WPW (Wolff-Parkinson-White syndrome)     [name] 03/06/2013  . High cholesterol   . Femoral condyle fracture ([place]) 03/08/2014    left medial/notes 03/09/2014  . Alcoholism /alcohol abuse ([place])   . Family history of adverse reaction to anesthesia     "grandmother gets confused"  . Shortness of breath     "can happen at anytime" (03/06/2013)  . Pneumonia 1990's X 3  . Chronic bronchitis ([place])   . Sickle cell trait ([place])   . History of blood transfusion 10/2012    "when I tried to commit suicide"  . History of stomach ulcers   . Migraine     "a few times/year" (03/26/2015)  . Arthritis     "knees; arms; elbows" (03/26/2015)  . Chronic lower back pain   . Bipolar disorder ([place])   . PTSD  (post-traumatic stress disorder)   . Marijuana abuse, continuous    Past Surgical History  Procedure Laterality Date  . Facial fracture surgery Left 1990's    "result of trauma"   . Eye surgery Left 1990's    "result of trauma"   . Left heart catheterization with coronary angiogram Right 03/07/2013    Procedure: LEFT HEART CATHETERIZATION WITH CORONARY ANGIOGRAM;  Surgeon: [name], MD;  Location: [facility];  Service: Cardiovascular;  Laterality: Right;  . Cardiac catheterization    . Fracture surgery    . Umbilical hernia repair    . Hernia repair     Family History  Problem Relation Age of Onset  . Hypertension Other   . Coronary artery disease Other    Social History  Substance Use Topics  . Smoking status: Current Every Day Smoker -- 1.00 packs/day for 30 years    Types: Cigarettes  . Smokeless tobacco: Current User    Types: Chew  . Alcohol Use: 37.8 oz/week    63 Cans of beer per week    Review of Systems  Constitutional: Negative for fever and chills.  Respiratory: Negative for cough, chest tightness and shortness of breath.   Cardiovascular: Negative for chest pain, palpitations and leg swelling.  Gastrointestinal: Positive for nausea, vomiting and abdominal pain. Negative for diarrhea and abdominal distention.  Genitourinary: Negative for dysuria, urgency, frequency and hematuria.  Musculoskeletal: Negative for myalgias, arthralgias, neck pain and neck stiffness.  Skin: Negative for rash.  Allergic/Immunologic: Negative for immunocompromised state.  Neurological: Negative for dizziness, weakness, light-headedness, numbness and headaches.      Allergies  Shellfish-derived products and Trazodone and nefazodone  Home Medications   Prior to Admission medications   Medication Sig Start Date End Date Taking? Authorizing Provider  albuterol (PROVENTIL HFA;VENTOLIN HFA) 108 (90 BASE) MCG/ACT inhaler Inhale 2 puffs into the lungs every 6 (six) hours as  needed for wheezing or shortness of breath (wheezing).     Historical Provider, MD  fluticasone (FLONASE) 50 MCG/ACT nasal spray Place 1 spray into both nostrils daily.    Historical Provider, MD  ibuprofen (ADVIL,MOTRIN) 200 MG tablet Take 200-800 mg by mouth every 6 (six) hours as needed for moderate pain.    Historical Provider, MD  lipase/protease/amylase (CREON) 12000 UNITS CPEP capsule Take 2 capsules (24,000 Units total) by mouth 3 (three) times daily with meals. 01/15/15   [name], MD  metoprolol tartrate (LOPRESSOR) 25 MG tablet Take 1 tablet (25 mg total) by mouth 2 (two) times daily. 01/15/15   [name], MD  omeprazole (PRILOSEC) 20 MG capsule Take 20 mg by mouth daily.    Historical Provider, MD  ondansetron (ZOFRAN) 4 MG tablet Take 1 tablet (4 mg total) by mouth every 6 (six) hours. 10/06/15   Stevi Barrett, PA-C  ondansetron (ZOFRAN-ODT) 4 MG disintegrating tablet Take 1 tablet (4 mg total) by mouth every 8 (eight) hours as needed for nausea or vomiting. 8mg  ODT q4 hours prn nausea Patient taking differently: Take 4 mg by mouth every 8 (eight) hours as needed for nausea or vomiting.  09/12/15   [name], MD  oxyCODONE-acetaminophen (PERCOCET/ROXICET) 5-325 MG tablet Take 1 tablet by mouth every 6 (six) hours as needed for severe pain. 08/26/15   Dalia Heading, PA-C  promethazine (PHENERGAN) 25 MG tablet Take 1 tablet (25 mg total) by mouth every 8 (eight) hours as needed for nausea or vomiting. 08/26/15   [name], PA-C  QUEtiapine (SEROQUEL) 50 MG tablet Take 100 mg by mouth at bedtime. 01/15/15   Historical Provider, MD  ranitidine (ZANTAC) 150 MG tablet Take 1 tablet (150 mg total) by mouth 2 (two) times daily. 08/24/15   [name], MD  sertraline (ZOLOFT) 25 MG tablet Take 75 mg by mouth daily.    Historical Provider, MD  sildenafil (VIAGRA) 50 MG tablet Take 1 tablet (50 mg total) by mouth daily as needed for erectile dysfunction. 03/31/15    [name], MD  sucralfate (CARAFATE) 1 g tablet Take 1 g by mouth 4 (four) times daily -  with meals and at bedtime.    Historical Provider, MD   BP 130/83 mmHg  Resp 17 Physical Exam  Constitutional: He appears well-developed and well-nourished. No distress.  HENT:  Head: Normocephalic and atraumatic.  Eyes: Conjunctivae are normal.  Neck: Neck supple.  Cardiovascular: Normal rate, regular rhythm and normal heart sounds.   Pulmonary/Chest: Effort normal. No respiratory distress. He has no wheezes. He has no rales.  Abdominal: Soft. Bowel sounds are normal. He exhibits no distension. There is tenderness. There is no rebound.  Diffuse tenderness, worse in epigastric area  Musculoskeletal: He exhibits no edema.  Neurological: He is alert.  Skin: Skin is warm and dry.  Nursing note and vitals reviewed.   ED Course  Procedures (including critical care  time) Labs Review Labs Reviewed  CBC WITH DIFFERENTIAL/PLATELET - Abnormal; Notable for the following:    RBC 3.53 (*)    Hemoglobin 11.0 (*)    HCT 34.0 (*)    All other components within normal limits  COMPREHENSIVE METABOLIC PANEL - Abnormal; Notable for the following:    Calcium 8.8 (*)    AST 49 (*)    All other components within normal limits  LIPASE, BLOOD    Imaging Review No results found. I have personally reviewed and evaluated these images and lab results as part of my medical decision-making.   EKG Interpretation None      MDM   Final diagnoses:  Chronic pancreatitis, unspecified pancreatitis type ([place])   Pt in emergency dept with abdominal pain. Multiple visits for the same. Has care plan. Will check labs. GI cocktail, zofran, norco, pepcid ordered.   8:20 AM Labs with no significant findings. Lipase normal. Pt in nad. No vomiting. Vital signs are normal. Plan to discharge home, liquid diet at home, avoid fatty foods. Zofran for nausea. Follow-up with primary care doctor. No peritoneal signs on  exam. Most likely exacerbation of chronic pancreatitis after drinking alcohol. Advised to stop drinking.   Filed Vitals:   10/18/15 0600 10/18/15 0615 10/18/15 0630 10/18/15 0700  BP: 101/79 124/85 130/83 115/93  Resp: 15 17         Allard Lightsey, PA-C 10/18/15 [place], MD 10/21/15 1505

## 2015-10-18 NOTE — ED Notes (Signed)
Pt approached this nurse wanting his IV out. Pt dressed in street clothes reports ready to go home. IV removed and pt given discharge papers.

## 2015-10-18 NOTE — Discharge Instructions (Signed)
zofran as prescribed as needed for nausea. Liquid diet for 2 days. Follow up with your doctor.    Low-Fat Diet for Pancreatitis or Gallbladder Conditions A low-fat diet can be helpful if you have pancreatitis or a gallbladder condition. With these conditions, your pancreas and gallbladder have trouble digesting fats. A healthy eating plan with less fat will help rest your pancreas and gallbladder and reduce your symptoms. WHAT DO I NEED TO KNOW ABOUT THIS DIET?  Eat a low-fat diet.  Reduce your fat intake to less than 20-30% of your total daily calories. This is less than 50-60 g of fat per day.  Remember that you need some fat in your diet. Ask your dietician what your daily goal should be.  Choose nonfat and low-fat healthy foods. Look for the words "nonfat," "low fat," or "fat free."  As a guide, look on the label and choose foods with less than 3 g of fat per serving. Eat only one serving.  Avoid alcohol.  Do not smoke. If you need help quitting, talk with your health care provider.  Eat small frequent meals instead of three large heavy meals. WHAT FOODS CAN I EAT? Grains Include healthy grains and starches such as potatoes, wheat bread, fiber-rich cereal, and brown rice. Choose whole grain options whenever possible. In adults, whole grains should account for 45-65% of your daily calories.  Fruits and Vegetables Eat plenty of fruits and vegetables. Fresh fruits and vegetables add fiber to your diet. Meats and Other Protein Sources Eat lean meat such as chicken and pork. Trim any fat off of meat before cooking it. Eggs, fish, and beans are other sources of protein. In adults, these foods should account for 10-35% of your daily calories. Dairy Choose low-fat milk and dairy options. Dairy includes fat and protein, as well as calcium.  Fats and Oils Limit high-fat foods such as fried foods, sweets, baked goods, sugary drinks.  Other Creamy sauces and condiments, such as mayonnaise,  can add extra fat. Think about whether or not you need to use them, or use smaller amounts or low fat options. WHAT FOODS ARE NOT RECOMMENDED?  High fat foods, such as:  [employer].  Ice cream.  [place] toast.  Sweet rolls.  Pizza.  Cheese bread.  Foods covered with batter, butter, creamy sauces, or cheese.  Fried foods.  Sugary drinks and desserts.  Foods that cause gas or bloating   This information is not intended to replace advice given to you by your health care provider. Make sure you discuss any questions you have with your health care provider.   Document Released: 04/30/2013 Document Reviewed: 04/30/2013 Elsevier Interactive Patient Education [employer].

## 2015-11-15 ENCOUNTER — Encounter (HOSPITAL_COMMUNITY): Payer: Self-pay | Admitting: Emergency Medicine

## 2015-11-15 ENCOUNTER — Emergency Department (HOSPITAL_COMMUNITY)
Admission: EM | Admit: 2015-11-15 | Discharge: 2015-11-15 | Disposition: A | Discharge status: Home / Self Care | Payer: No Typology Code available for payment source | Attending: Emergency Medicine | Admitting: Emergency Medicine

## 2015-11-15 DIAGNOSIS — Z79899 Other long term (current) drug therapy: Secondary | ICD-10-CM | POA: Insufficient documentation

## 2015-11-15 DIAGNOSIS — R1013 Epigastric pain: Secondary | ICD-10-CM | POA: Insufficient documentation

## 2015-11-15 DIAGNOSIS — R1032 Left lower quadrant pain: Secondary | ICD-10-CM | POA: Insufficient documentation

## 2015-11-15 DIAGNOSIS — R109 Unspecified abdominal pain: Secondary | ICD-10-CM

## 2015-11-15 DIAGNOSIS — J45909 Unspecified asthma, uncomplicated: Secondary | ICD-10-CM | POA: Insufficient documentation

## 2015-11-15 DIAGNOSIS — G8929 Other chronic pain: Secondary | ICD-10-CM

## 2015-11-15 DIAGNOSIS — I10 Essential (primary) hypertension: Secondary | ICD-10-CM | POA: Insufficient documentation

## 2015-11-15 DIAGNOSIS — F17210 Nicotine dependence, cigarettes, uncomplicated: Secondary | ICD-10-CM | POA: Insufficient documentation

## 2015-11-15 MED ORDER — ONDANSETRON HCL 4 MG PO TABS: 4.0000 mg | ORAL_TABLET | Freq: Four times a day (QID) | ORAL | Status: DC | PRN

## 2015-11-15 MED ORDER — SUCRALFATE 1 G PO TABS: 1.0000 g | ORAL_TABLET | Freq: Three times a day (TID) | ORAL | Status: DC

## 2015-11-15 MED ORDER — GI COCKTAIL ~~LOC~~
30.0000 mL | Freq: Once | ORAL | Status: AC
  Administered 2015-11-15: 30 mL via ORAL
  Filled 2015-11-15: qty 30

## 2015-11-15 MED ORDER — OMEPRAZOLE 20 MG PO CPDR: 20.0000 mg | DELAYED_RELEASE_CAPSULE | Freq: Every day | ORAL | Status: DC

## 2015-11-15 MED ORDER — HYDROCODONE-ACETAMINOPHEN 5-325 MG PO TABS
2.0000 | ORAL_TABLET | Freq: Once | ORAL | Status: AC
  Administered 2015-11-15: 2 via ORAL
  Filled 2015-11-15: qty 2

## 2015-11-15 MED ORDER — SUCRALFATE 1 G PO TABS
1.0000 g | ORAL_TABLET | Freq: Once | ORAL | Status: AC
  Administered 2015-11-15: 1 g via ORAL
  Filled 2015-11-15: qty 1

## 2015-11-15 NOTE — Discharge Instructions (Signed)
Mr. [name],  Nice meeting you! Please follow-up with your primary care provider. Return to the emergency department if you develop fevers, chills, increased abdominal pain, new/worsening symptoms. Feel better soon!  S. Wendie Simmer, PA-C

## 2015-11-15 NOTE — ED Notes (Addendum)
Pt from home with complaints of lower left side abdominal pain similar to his typical pancreatitis pain. Pt states he has had 4 episodes of diarrhea today and has thrown up one time today.  Pt was initially seen wandering in triage area. Pt had to be told twice to return to his room.

## 2015-11-15 NOTE — ED Notes (Signed)
Per EMS report: pt presents with abd pain that began around 07:00.  Pain is in the left upper quadrant that is tender to palpation. Pt hx of pancreatitis and pain is similar to past pancreatitis pain.

## 2015-11-15 NOTE — ED Provider Notes (Signed)
CSN: [id number]     Arrival date & time 11/15/15  1450 History   First MD Initiated Contact with Patient 11/15/15 1605     Chief Complaint  Patient presents with  . Abdominal Pain   HPI   [name] is a 45 y.o. male  presenting with a 1 day history of abdominal pain. He describes his abdominal pain as epigastric, stabbing, 10/10 pain scale, similar to his chronic abdominal pain, nonradiating. He endorses eating a full meal last night and this morning. He has had multiple episodes of diarrhea this morning (nonbloody) and one episode of vomiting this morning. He denies fevers, chills.   Past Medical History  Diagnosis Date  . Hypertension   . Asthma   . Pancreatitis   . Cocaine abuse   . Depression   . H/O suicide attempt 10/2012  . Heart murmur     "when he was little" (03/06/2013)  . Anemia   . H/O hiatal hernia   . GERD (gastroesophageal reflux disease)   . Anxiety   . WPW (Wolff-Parkinson-White syndrome)     [name] 03/06/2013  . High cholesterol   . Femoral condyle fracture ([place]) 03/08/2014    left medial/notes 03/09/2014  . Alcoholism /alcohol abuse ([place])   . Family history of adverse reaction to anesthesia     "grandmother gets confused"  . Shortness of breath     "can happen at anytime" (03/06/2013)  . Pneumonia 1990's X 3  . Chronic bronchitis ([place])   . Sickle cell trait ([place])   . History of blood transfusion 10/2012    "when I tried to commit suicide"  . History of stomach ulcers   . Migraine     "a few times/year" (03/26/2015)  . Arthritis     "knees; arms; elbows" (03/26/2015)  . Chronic lower back pain   . Bipolar disorder ([place])   . PTSD (post-traumatic stress disorder)   . Marijuana abuse, continuous    Past Surgical History  Procedure Laterality Date  . Facial fracture surgery Left 1990's    "result of trauma"   . Eye surgery Left 1990's    "result of trauma"   . Left heart catheterization with coronary angiogram Right 03/07/2013    Procedure:  LEFT HEART CATHETERIZATION WITH CORONARY ANGIOGRAM;  Surgeon: [name], MD;  Location: [facility] [facility] CATH LAB;  Service: Cardiovascular;  Laterality: Right;  . Cardiac catheterization    . Fracture surgery    . Umbilical hernia repair    . Hernia repair     Family History  Problem Relation Age of Onset  . Hypertension Other   . Coronary artery disease Other    Social History  Substance Use Topics  . Smoking status: Current Every Day Smoker -- 1.00 packs/day for 30 years    Types: Cigarettes  . Smokeless tobacco: Current User    Types: Chew  . Alcohol Use: 37.8 oz/week    63 Cans of beer per week    Review of Systems  Ten systems are reviewed and are negative for acute change except as noted in the HPI  Allergies  Shellfish-derived products and Trazodone and nefazodone  Home Medications   Prior to Admission medications   Medication Sig Start Date End Date Taking? Authorizing Provider  albuterol (PROVENTIL HFA;VENTOLIN HFA) 108 (90 BASE) MCG/ACT inhaler Inhale 2 puffs into the lungs every 6 (six) hours as needed for wheezing or shortness of breath (wheezing).    Yes Historical Provider, MD  fluticasone (  FLONASE) 50 MCG/ACT nasal spray Place 1 spray into both nostrils daily.   Yes Historical Provider, MD  lipase/protease/amylase (CREON) 12000 UNITS CPEP capsule Take 2 capsules (24,000 Units total) by mouth 3 (three) times daily with meals. 01/15/15  Yes [name], MD  metoprolol tartrate (LOPRESSOR) 25 MG tablet Take 1 tablet (25 mg total) by mouth 2 (two) times daily. 01/15/15  Yes [name], MD  omeprazole (PRILOSEC) 20 MG capsule Take 20 mg by mouth daily.   Yes Historical Provider, MD  ondansetron (ZOFRAN) 4 MG tablet Take 1 tablet (4 mg total) by mouth every 6 (six) hours. Patient taking differently: Take 4 mg by mouth every 8 (eight) hours as needed for nausea or vomiting.  10/06/15  Yes Stevi Barrett, PA-C  QUEtiapine (SEROQUEL) 50 MG tablet Take 100 mg by mouth  at bedtime. 01/15/15  Yes Historical Provider, MD  sertraline (ZOLOFT) 25 MG tablet Take 75 mg by mouth daily.   Yes Historical Provider, MD  sildenafil (VIAGRA) 50 MG tablet Take 1 tablet (50 mg total) by mouth daily as needed for erectile dysfunction. 03/31/15  Yes [name], MD  sucralfate (CARAFATE) 1 g tablet Take 1 g by mouth 4 (four) times daily -  with meals and at bedtime.   Yes Historical Provider, MD  ibuprofen (ADVIL,MOTRIN) 200 MG tablet Take 200-800 mg by mouth every 6 (six) hours as needed for moderate pain.    Historical Provider, MD  ondansetron (ZOFRAN ODT) 8 MG disintegrating tablet Take 1 tablet (8 mg total) by mouth every 8 (eight) hours as needed for nausea or vomiting. Patient not taking: Reported on 11/15/2015 10/18/15   Jeannett Senior, PA-C  oxyCODONE-acetaminophen (PERCOCET/ROXICET) 5-325 MG tablet Take 1 tablet by mouth every 6 (six) hours as needed for severe pain. Patient not taking: Reported on 11/15/2015 08/26/15   Dalia Heading, PA-C  promethazine (PHENERGAN) 25 MG tablet Take 1 tablet (25 mg total) by mouth every 8 (eight) hours as needed for nausea or vomiting. Patient not taking: Reported on 11/15/2015 08/26/15   Dalia Heading, PA-C  ranitidine (ZANTAC) 150 MG tablet Take 1 tablet (150 mg total) by mouth 2 (two) times daily. Patient not taking: Reported on 11/15/2015 08/24/15   [name], MD   BP 151/104 mmHg  Pulse 92  Temp(Src) 98.2 F (36.8 C) (Oral)  Resp 20  SpO2 100% Physical Exam  Constitutional: He appears well-developed and well-nourished. No distress.  HENT:  Head: Normocephalic and atraumatic.  Mouth/Throat: Oropharynx is clear and moist. No oropharyngeal exudate.  Eyes: Conjunctivae are normal. Pupils are equal, round, and reactive to light. Right eye exhibits no discharge. Left eye exhibits no discharge. No scleral icterus.  Neck: No tracheal deviation present.  Cardiovascular: Normal rate, regular rhythm, normal heart sounds  and intact distal pulses.  Exam reveals no gallop and no friction rub.   No murmur heard. Pulmonary/Chest: Effort normal and breath sounds normal. No respiratory distress. He has no wheezes. He has no rales. He exhibits no tenderness.  Abdominal: Soft. Bowel sounds are normal. He exhibits no distension and no mass. There is tenderness. There is no rebound and no guarding.  Mild distractible epigastric tenderness. No peritoneal signs  Musculoskeletal: He exhibits no edema.  Lymphadenopathy:    He has no cervical adenopathy.  Neurological: He is alert. Coordination normal.  Skin: Skin is warm and dry. No rash noted. He is not diaphoretic. No erythema.  Psychiatric: He has a normal mood and affect. His  behavior is normal.  Nursing note and vitals reviewed.   ED Course  Procedures   MDM   Final diagnoses:  Chronic abdominal pain   Patient has a care plan which is reviewed by me. The plan notes if this is an exacerbation of chronic abdominal pain to avoid narcotics, discharge immediately and to limit evaluation and avoid labs/imaging if patient presents for exacerbation of chronic abdominal pain. Patient was advised to avoid alcohol. Patient Carafate, Zofran, omeprazole was refilled. Patient states he has been out of his narcotics for 2 days. I informed him I would not be refilling this today and for him to follow-up with his primary care provider. Patient may be safely discharged home. Discussed reasons for return. Patient to follow-up with primary care provider within one week. Patient in understanding and agreement with the plan.  Discussed case with Dr. Zenia Resides who advised no labs, symptomatic treatment, and agrees with above plan.   Marianna Lions, PA-C 11/29/15 2113  Lacretia Leigh, MD 12/08/15 [phone]

## 2015-11-17 ENCOUNTER — Emergency Department (HOSPITAL_COMMUNITY)
Admission: EM | Admit: 2015-11-17 | Discharge: 2015-11-17 | Disposition: A | Discharge status: Home / Self Care | Payer: No Typology Code available for payment source | Attending: Emergency Medicine | Admitting: Emergency Medicine

## 2015-11-17 ENCOUNTER — Emergency Department (HOSPITAL_COMMUNITY)
Admission: EM | Admit: 2015-11-17 | Discharge: 2015-11-18 | Disposition: A | Discharge status: Home / Self Care | Payer: No Typology Code available for payment source | Attending: Emergency Medicine | Admitting: Emergency Medicine

## 2015-11-17 ENCOUNTER — Encounter (HOSPITAL_COMMUNITY): Payer: Self-pay | Admitting: Emergency Medicine

## 2015-11-17 DIAGNOSIS — F17210 Nicotine dependence, cigarettes, uncomplicated: Secondary | ICD-10-CM | POA: Insufficient documentation

## 2015-11-17 DIAGNOSIS — F1490 Cocaine use, unspecified, uncomplicated: Secondary | ICD-10-CM | POA: Insufficient documentation

## 2015-11-17 DIAGNOSIS — F1210 Cannabis abuse, uncomplicated: Secondary | ICD-10-CM | POA: Insufficient documentation

## 2015-11-17 DIAGNOSIS — I10 Essential (primary) hypertension: Secondary | ICD-10-CM | POA: Insufficient documentation

## 2015-11-17 DIAGNOSIS — R112 Nausea with vomiting, unspecified: Secondary | ICD-10-CM | POA: Insufficient documentation

## 2015-11-17 DIAGNOSIS — R109 Unspecified abdominal pain: Secondary | ICD-10-CM

## 2015-11-17 DIAGNOSIS — R1012 Left upper quadrant pain: Secondary | ICD-10-CM | POA: Insufficient documentation

## 2015-11-17 DIAGNOSIS — G8929 Other chronic pain: Secondary | ICD-10-CM | POA: Insufficient documentation

## 2015-11-17 DIAGNOSIS — Z79899 Other long term (current) drug therapy: Secondary | ICD-10-CM | POA: Insufficient documentation

## 2015-11-17 DIAGNOSIS — F329 Major depressive disorder, single episode, unspecified: Secondary | ICD-10-CM | POA: Insufficient documentation

## 2015-11-17 DIAGNOSIS — M1990 Unspecified osteoarthritis, unspecified site: Secondary | ICD-10-CM | POA: Insufficient documentation

## 2015-11-17 DIAGNOSIS — Z7951 Long term (current) use of inhaled steroids: Secondary | ICD-10-CM | POA: Insufficient documentation

## 2015-11-17 DIAGNOSIS — I456 Pre-excitation syndrome: Secondary | ICD-10-CM | POA: Insufficient documentation

## 2015-11-17 DIAGNOSIS — F17220 Nicotine dependence, chewing tobacco, uncomplicated: Secondary | ICD-10-CM | POA: Insufficient documentation

## 2015-11-17 DIAGNOSIS — Z955 Presence of coronary angioplasty implant and graft: Secondary | ICD-10-CM | POA: Insufficient documentation

## 2015-11-17 DIAGNOSIS — J45909 Unspecified asthma, uncomplicated: Secondary | ICD-10-CM | POA: Insufficient documentation

## 2015-11-17 DIAGNOSIS — Z791 Long term (current) use of non-steroidal anti-inflammatories (NSAID): Secondary | ICD-10-CM | POA: Insufficient documentation

## 2015-11-17 LAB — URINALYSIS, ROUTINE W REFLEX MICROSCOPIC
GLUCOSE, UA: NEGATIVE mg/dL
HGB URINE DIPSTICK: NEGATIVE
Ketones, ur: 15 mg/dL — AB
LEUKOCYTES UA: NEGATIVE
Nitrite: NEGATIVE
PH: 5.5 (ref 5.0–8.0)
PROTEIN: NEGATIVE mg/dL
Specific Gravity, Urine: 1.024 (ref 1.005–1.030)

## 2015-11-17 LAB — COMPREHENSIVE METABOLIC PANEL
ALT: 52 U/L (ref 17–63)
AST: 47 U/L — AB (ref 15–41)
Albumin: 4.4 g/dL (ref 3.5–5.0)
Alkaline Phosphatase: 124 U/L (ref 38–126)
Anion gap: 9 (ref 5–15)
BUN: 7 mg/dL (ref 6–20)
CHLORIDE: 100 mmol/L — AB (ref 101–111)
CO2: 27 mmol/L (ref 22–32)
CREATININE: 0.74 mg/dL (ref 0.61–1.24)
Calcium: 9.8 mg/dL (ref 8.9–10.3)
GFR calc Af Amer: >60 mL/min (ref >60)
GFR calc non Af Amer: >60 mL/min (ref >60)
Glucose, Bld: 106 mg/dL — ABNORMAL HIGH (ref 65–99)
POTASSIUM: 4.2 mmol/L (ref 3.5–5.1)
SODIUM: 136 mmol/L (ref 135–145)
Total Bilirubin: 1 mg/dL (ref 0.3–1.2)
Total Protein: 8.9 g/dL — ABNORMAL HIGH (ref 6.5–8.1)

## 2015-11-17 LAB — CBC
HEMATOCRIT: 41.1 % (ref 39.0–52.0)
Hemoglobin: 13.8 g/dL (ref 13.0–17.0)
MCH: 32.2 pg (ref 26.0–34.0)
MCHC: 33.6 g/dL (ref 30.0–36.0)
MCV: 95.8 fL (ref 78.0–100.0)
PLATELETS: 280 10*3/uL (ref 150–400)
RBC: 4.29 MIL/uL (ref 4.22–5.81)
RDW: 13.9 % (ref 11.5–15.5)
WBC: 9 10*3/uL (ref 4.0–10.5)

## 2015-11-17 LAB — LIPASE, BLOOD: LIPASE: 90 U/L — AB (ref 11–51)

## 2015-11-17 MED ORDER — SUCRALFATE 1 G PO TABS
1.0000 g | ORAL_TABLET | Freq: Once | ORAL | Status: AC
  Administered 2015-11-17: 1 g via ORAL
  Filled 2015-11-17: qty 1

## 2015-11-17 MED ORDER — CELECOXIB 400 MG PO CAPS
400.0000 mg | ORAL_CAPSULE | Freq: Once | ORAL | Status: AC
  Administered 2015-11-17: 400 mg via ORAL
  Filled 2015-11-17: qty 1

## 2015-11-17 MED ORDER — GI COCKTAIL ~~LOC~~
30.0000 mL | Freq: Once | ORAL | Status: AC
  Administered 2015-11-17: 30 mL via ORAL
  Filled 2015-11-17: qty 30

## 2015-11-17 NOTE — ED Provider Notes (Signed)
CSN: [id number]     Arrival date & time 11/17/15  1136 History   First MD Initiated Contact with Patient 11/17/15 1557     Chief Complaint  Patient presents with  . Abdominal Pain     (Consider location/radiation/quality/duration/timing/severity/associated sxs/prior Treatment) Patient is a 45 y.o. male presenting with abdominal pain. The history is provided by the patient.  Abdominal Pain Pain location:  LUQ Pain quality: aching and sharp   Pain quality comment:  "feels like my pancreatitis" Pain radiates to:  Does not radiate Pain severity:  Moderate Onset quality:  Gradual Duration:  4 days Timing:  Intermittent Progression:  Waxing and waning Context: alcohol use   Context: not diet changes, not recent illness and not sick contacts   Relieved by:  Nothing Worsened by:  Eating Associated symptoms: anorexia, cough, diarrhea, nausea and vomiting (x1 today)   Associated symptoms: no chest pain, no chills, no constipation, no dysuria, no fatigue, no fever, no hematemesis, no hematochezia, no melena and no shortness of breath     Past Medical History  Diagnosis Date  . Hypertension   . Asthma   . Pancreatitis   . Cocaine abuse   . Depression   . H/O suicide attempt 10/2012  . Heart murmur     "when he was little" (03/06/2013)  . Anemia   . H/O hiatal hernia   . GERD (gastroesophageal reflux disease)   . Anxiety   . WPW (Wolff-Parkinson-White syndrome)     [name] 03/06/2013  . High cholesterol   . Femoral condyle fracture ([place]) 03/08/2014    left medial/notes 03/09/2014  . Alcoholism /alcohol abuse ([place])   . Family history of adverse reaction to anesthesia     "grandmother gets confused"  . Shortness of breath     "can happen at anytime" (03/06/2013)  . Pneumonia 1990's X 3  . Chronic bronchitis ([place])   . Sickle cell trait ([place])   . History of blood transfusion 10/2012    "when I tried to commit suicide"  . History of stomach ulcers   . Migraine     "a few  times/year" (03/26/2015)  . Arthritis     "knees; arms; elbows" (03/26/2015)  . Chronic lower back pain   . Bipolar disorder ([place])   . PTSD (post-traumatic stress disorder)   . Marijuana abuse, continuous    Past Surgical History  Procedure Laterality Date  . Facial fracture surgery Left 1990's    "result of trauma"   . Eye surgery Left 1990's    "result of trauma"   . Left heart catheterization with coronary angiogram Right 03/07/2013    Procedure: LEFT HEART CATHETERIZATION WITH CORONARY ANGIOGRAM;  Surgeon: [name], MD;  Location: [facility] CATH LAB;  Service: Cardiovascular;  Laterality: Right;  . Cardiac catheterization    . Fracture surgery    . Umbilical hernia repair    . Hernia repair     Family History  Problem Relation Age of Onset  . Hypertension Other   . Coronary artery disease Other    Social History  Substance Use Topics  . Smoking status: Current Every Day Smoker -- 1.00 packs/day for 30 years    Types: Cigarettes  . Smokeless tobacco: Current User    Types: Chew  . Alcohol Use: 37.8 oz/week    63 Cans of beer per week    Review of Systems  Constitutional: Negative for fever, chills and fatigue.  HENT: Negative for congestion.   Respiratory: Positive  for cough. Negative for shortness of breath.   Cardiovascular: Positive for palpitations. Negative for chest pain.  Gastrointestinal: Positive for nausea, vomiting (x1 today), abdominal pain, diarrhea and anorexia. Negative for constipation, melena, hematochezia and hematemesis.  Genitourinary: Negative for dysuria.  Skin: Negative for rash.  Neurological: Negative for light-headedness and headaches.  Hematological: Negative for adenopathy.  Psychiatric/Behavioral: Negative for confusion and agitation.      Allergies  Shellfish-derived products; Trazodone and nefazodone; and Reglan  Home Medications   Prior to Admission medications   Medication Sig Start Date End Date Taking? Authorizing Provider   albuterol (PROVENTIL HFA;VENTOLIN HFA) 108 (90 BASE) MCG/ACT inhaler Inhale 2 puffs into the lungs every 6 (six) hours as needed for wheezing or shortness of breath (wheezing).    Yes Historical Provider, MD  fluticasone (FLONASE) 50 MCG/ACT nasal spray Place 1 spray into both nostrils daily.   Yes Historical Provider, MD  ibuprofen (ADVIL,MOTRIN) 200 MG tablet Take 200-800 mg by mouth every 6 (six) hours as needed for mild pain or moderate pain.    Yes Historical Provider, MD  lipase/protease/amylase (CREON) 12000 UNITS CPEP capsule Take 2 capsules (24,000 Units total) by mouth 3 (three) times daily with meals. 01/15/15  Yes [name], MD  metoprolol tartrate (LOPRESSOR) 25 MG tablet Take 1 tablet (25 mg total) by mouth 2 (two) times daily. 01/15/15  Yes [name], MD  omeprazole (PRILOSEC) 20 MG capsule Take 1 capsule (20 mg total) by mouth daily. 11/15/15  Yes [place] Lions, PA-C  Phenylephrine-APAP-Guaifenesin [facility] SINUS-MAX CONGESTION PO) Take 1 tablet by mouth 2 (two) times daily as needed (for congestion).   Yes Historical Provider, MD  promethazine (PHENERGAN) 25 MG tablet Take 1 tablet (25 mg total) by mouth every 8 (eight) hours as needed for nausea or vomiting. 08/26/15  Yes [name], PA-C  QUEtiapine (SEROQUEL) 50 MG tablet Take 100 mg by mouth at bedtime. 01/15/15  Yes Historical Provider, MD  sertraline (ZOLOFT) 25 MG tablet Take 75 mg by mouth daily.   Yes Historical Provider, MD  sildenafil (VIAGRA) 50 MG tablet Take 1 tablet (50 mg total) by mouth daily as needed for erectile dysfunction. 03/31/15  Yes Olugbemiga Essie Christine, MD  sucralfate (CARAFATE) 1 g tablet Take 1 tablet (1 g total) by mouth 4 (four) times daily -  with meals and at bedtime. 11/15/15  Yes [place] Lions, PA-C  ondansetron (ZOFRAN ODT) 8 MG disintegrating tablet Take 1 tablet (8 mg total) by mouth every 8 (eight) hours as needed for nausea or vomiting. Patient not taking:  Reported on 11/15/2015 10/18/15   [name], PA-C  ondansetron (ZOFRAN) 4 MG tablet Take 1 tablet (4 mg total) by mouth every 6 (six) hours as needed for nausea or vomiting. Patient not taking: Reported on 11/17/2015 11/15/15   [place] Lions, PA-C  oxyCODONE-acetaminophen (PERCOCET/ROXICET) 5-325 MG tablet Take 1 tablet by mouth every 6 (six) hours as needed for severe pain. Patient not taking: Reported on 11/15/2015 08/26/15   Dalia Heading, PA-C  ranitidine (ZANTAC) 150 MG tablet Take 1 tablet (150 mg total) by mouth 2 (two) times daily. Patient not taking: Reported on 11/15/2015 08/24/15   [name], MD   BP 138/94 mmHg  Pulse 90  Temp(Src) 97.9 F (36.6 C) (Oral)  Resp 16  SpO2 99% Physical Exam  Constitutional: He is oriented to person, place, and time. He appears well-developed and well-nourished. No distress.  HENT:  Head: Normocephalic and atraumatic.  Eyes: Conjunctivae are normal.  Cardiovascular: Normal rate and normal heart sounds.   No murmur heard. Pulmonary/Chest: Effort normal and breath sounds normal.  Abdominal: Soft. He exhibits no distension. There is no tenderness. There is no rebound and no guarding.  Musculoskeletal: He exhibits no edema.  Neurological: He is alert and oriented to person, place, and time.  Skin: Skin is warm. He is not diaphoretic.  Psychiatric: He has a normal mood and affect. His behavior is normal.  Nursing note and vitals reviewed.   ED Course  Procedures (including critical care time) Labs Review Labs Reviewed  LIPASE, BLOOD - Abnormal; Notable for the following:    Lipase 90 (*)    All other components within normal limits  COMPREHENSIVE METABOLIC PANEL - Abnormal; Notable for the following:    Chloride 100 (*)    Glucose, Bld 106 (*)    Total Protein 8.9 (*)    AST 47 (*)    All other components within normal limits  URINALYSIS, ROUTINE W REFLEX MICROSCOPIC (NOT AT [facility]) - Abnormal; Notable for the following:     Color, Urine AMBER (*)    Bilirubin Urine SMALL (*)    Ketones, ur 15 (*)    All other components within normal limits  CBC    Imaging Review No results found. I have personally reviewed and evaluated these images and lab results as part of my medical decision-making.   EKG Interpretation None      MDM   Final diagnoses:  Chronic abdominal pain    Patient with chronic abdominal pain presents with exacerbation of his chronic abdominal pain. He is concerned for a flare in his pancreatitis. Lipase 90. Abdomen soft with no tenderness. Pain improved with GI cocktail and celebrex. Laboratory evaluation shows unremarkable CMP and CBC. Patient encouraged to increase PO fluids for small urine ketones, likely 2/2 dehydration. Patient was discharged home in good condition.    Allie Bossier, MD 11/18/15 1345  Julianne Rice, MD 11/20/15 [phone]

## 2015-11-17 NOTE — ED Notes (Signed)
Pt has history of pancreatitis with chronic abd pain worsening since this weekend. Pt admits to drinking alcohol this past week after having an "emotional week".

## 2015-11-17 NOTE — Discharge Instructions (Signed)

## 2015-11-18 ENCOUNTER — Encounter (HOSPITAL_COMMUNITY): Payer: Self-pay | Admitting: Emergency Medicine

## 2015-11-18 LAB — LIPASE, BLOOD: Lipase: 67 U/L — ABNORMAL HIGH (ref 11–51)

## 2015-11-18 MED ORDER — GI COCKTAIL ~~LOC~~
30.0000 mL | Freq: Once | ORAL | Status: AC
  Administered 2015-11-18: 30 mL via ORAL
  Filled 2015-11-18: qty 30

## 2015-11-18 MED ORDER — HYDROCODONE-ACETAMINOPHEN 5-325 MG PO TABS
1.0000 | ORAL_TABLET | Freq: Once | ORAL | Status: AC
  Administered 2015-11-18: 1 via ORAL
  Filled 2015-11-18: qty 1

## 2015-11-18 MED ORDER — SUCRALFATE 1 G PO TABS
1.0000 g | ORAL_TABLET | Freq: Once | ORAL | Status: AC
  Administered 2015-11-18: 1 g via ORAL
  Filled 2015-11-18: qty 1

## 2015-11-18 NOTE — ED Notes (Signed)
Bed: WTR7 Expected date:  Expected time:  Means of arrival:  Comments: 

## 2015-11-18 NOTE — ED Provider Notes (Signed)
CSN: [id number]     Arrival date & time 11/17/15  2341 History   First MD Initiated Contact with Patient 11/18/15 0320     Chief Complaint  Patient presents with  . Pancreatitis     (Consider location/radiation/quality/duration/timing/severity/associated sxs/prior Treatment) HPI Comments: 45 year old male with a history of hypertension, pancreatitis, cocaine abuse, alcohol abuse, bipolar disorder and PTSD presents to the emergency department 8 hours following discharge from [facility] emergency for similar complaints of abdominal pain. Patient complaining of aching pain in his left upper quadrant. He states that he feels as though his pain is worse compared to when he was previously seen. He does state that he was able to eat some mashed potatoes and [place] ice upon returning home after initial evaluation. He had one episode of emesis since previous discharge which was nonbloody. Patient states that he last drank alcohol on 11/16/2015. He denies any known fevers. No bowel changes. He had a reassuring workup at [facility] and was discharged with prescriptions for supportive care.  This is his 27th visit to the emergency department in the last 6 months.  The history is provided by the patient. No language interpreter was used.    Past Medical History  Diagnosis Date  . Hypertension   . Asthma   . Pancreatitis   . Cocaine abuse   . Depression   . H/O suicide attempt 10/2012  . Heart murmur     "when he was little" (03/06/2013)  . Anemia   . H/O hiatal hernia   . GERD (gastroesophageal reflux disease)   . Anxiety   . WPW (Wolff-Parkinson-White syndrome)     [name] 03/06/2013  . High cholesterol   . Femoral condyle fracture ([place]) 03/08/2014    left medial/notes 03/09/2014  . Alcoholism /alcohol abuse ([place])   . Family history of adverse reaction to anesthesia     "grandmother gets confused"  . Shortness of breath     "can happen at anytime" (03/06/2013)  . Pneumonia 1990's X 3  .  Chronic bronchitis ([place])   . Sickle cell trait ([place])   . History of blood transfusion 10/2012    "when I tried to commit suicide"  . History of stomach ulcers   . Migraine     "a few times/year" (03/26/2015)  . Arthritis     "knees; arms; elbows" (03/26/2015)  . Chronic lower back pain   . Bipolar disorder ([place])   . PTSD (post-traumatic stress disorder)   . Marijuana abuse, continuous    Past Surgical History  Procedure Laterality Date  . Facial fracture surgery Left 1990's    "result of trauma"   . Eye surgery Left 1990's    "result of trauma"   . Left heart catheterization with coronary angiogram Right 03/07/2013    Procedure: LEFT HEART CATHETERIZATION WITH CORONARY ANGIOGRAM;  Surgeon: [name], MD;  Location: [facility] CATH LAB;  Service: Cardiovascular;  Laterality: Right;  . Cardiac catheterization    . Fracture surgery    . Umbilical hernia repair    . Hernia repair     Family History  Problem Relation Age of Onset  . Hypertension Other   . Coronary artery disease Other    Social History  Substance Use Topics  . Smoking status: Current Every Day Smoker -- 1.00 packs/day for 30 years    Types: Cigarettes  . Smokeless tobacco: Current User    Types: Chew  . Alcohol Use: 37.8 oz/week  63 Cans of beer per week    Review of Systems  Gastrointestinal: Positive for nausea, vomiting and abdominal pain.  All other systems reviewed and are negative.   Allergies  Shellfish-derived products; Trazodone and nefazodone; and Reglan  Home Medications   Prior to Admission medications   Medication Sig Start Date End Date Taking? Authorizing Provider  albuterol (PROVENTIL HFA;VENTOLIN HFA) 108 (90 BASE) MCG/ACT inhaler Inhale 2 puffs into the lungs every 6 (six) hours as needed for wheezing or shortness of breath (wheezing).    Yes Historical Provider, MD  fluticasone (FLONASE) 50 MCG/ACT nasal spray Place 1 spray into both nostrils daily.   Yes Historical Provider, MD   ibuprofen (ADVIL,MOTRIN) 200 MG tablet Take 200-800 mg by mouth every 6 (six) hours as needed for mild pain or moderate pain.    Yes Historical Provider, MD  lipase/protease/amylase (CREON) 12000 UNITS CPEP capsule Take 2 capsules (24,000 Units total) by mouth 3 (three) times daily with meals. 01/15/15  Yes [name], MD  metoprolol tartrate (LOPRESSOR) 25 MG tablet Take 1 tablet (25 mg total) by mouth 2 (two) times daily. 01/15/15  Yes [name], MD  omeprazole (PRILOSEC) 20 MG capsule Take 1 capsule (20 mg total) by mouth daily. 11/15/15  Yes [place] Lions, PA-C  Phenylephrine-APAP-Guaifenesin [facility] SINUS-MAX CONGESTION PO) Take 1 tablet by mouth 2 (two) times daily as needed (for congestion).   Yes Historical Provider, MD  promethazine (PHENERGAN) 25 MG tablet Take 1 tablet (25 mg total) by mouth every 8 (eight) hours as needed for nausea or vomiting. 08/26/15  Yes [name], PA-C  QUEtiapine (SEROQUEL) 50 MG tablet Take 100 mg by mouth at bedtime. 01/15/15  Yes Historical Provider, MD  sertraline (ZOLOFT) 25 MG tablet Take 75 mg by mouth daily.   Yes Historical Provider, MD  sildenafil (VIAGRA) 50 MG tablet Take 1 tablet (50 mg total) by mouth daily as needed for erectile dysfunction. 03/31/15  Yes Olugbemiga Essie Christine, MD  sucralfate (CARAFATE) 1 g tablet Take 1 tablet (1 g total) by mouth 4 (four) times daily -  with meals and at bedtime. 11/15/15  Yes [place] Lions, PA-C  ondansetron (ZOFRAN ODT) 8 MG disintegrating tablet Take 1 tablet (8 mg total) by mouth every 8 (eight) hours as needed for nausea or vomiting. Patient not taking: Reported on 11/15/2015 10/18/15   [name], PA-C  ondansetron (ZOFRAN) 4 MG tablet Take 1 tablet (4 mg total) by mouth every 6 (six) hours as needed for nausea or vomiting. Patient not taking: Reported on 11/17/2015 11/15/15   Grant-Valkaria Lions, PA-C  oxyCODONE-acetaminophen (PERCOCET/ROXICET) 5-325 MG tablet Take 1  tablet by mouth every 6 (six) hours as needed for severe pain. Patient not taking: Reported on 11/15/2015 08/26/15   Dalia Heading, PA-C  ranitidine (ZANTAC) 150 MG tablet Take 1 tablet (150 mg total) by mouth 2 (two) times daily. Patient not taking: Reported on 11/15/2015 08/24/15   [name], MD   BP 150/107 mmHg  Pulse 96  Temp(Src) 98.1 F (36.7 C) (Oral)  Resp 16  Ht 5\' 8"  (1.727 m)  Wt 64.864 kg  BMI 21.75 kg/m2  SpO2 98%   Physical Exam  Constitutional: He is oriented to person, place, and time. He appears well-developed and well-nourished. No distress.  Nontoxic appearing  HENT:  Head: Normocephalic and atraumatic.  Eyes: Conjunctivae and EOM are normal. No scleral icterus.  Neck: Normal range of motion.  Cardiovascular: Normal rate, regular rhythm  and intact distal pulses.   Pulmonary/Chest: Effort normal and breath sounds normal. No respiratory distress. He has no wheezes.  Respirations even and unlabored  Abdominal: Soft. He exhibits no distension. There is tenderness. There is no rebound and no guarding.  Mild focal LUQ TTP. No masses. No peritoneal signs. No abdominal distension.  Musculoskeletal: Normal range of motion.  Neurological: He is alert and oriented to person, place, and time. He exhibits normal muscle tone. Coordination normal.  Patient moving all extremities.  Skin: Skin is warm and dry. No rash noted. He is not diaphoretic. No erythema. No pallor.  Psychiatric: He has a normal mood and affect. His behavior is normal.  Nursing note and vitals reviewed.   ED Course  Procedures (including critical care time) Labs Review Labs Reviewed  LIPASE, BLOOD - Abnormal; Notable for the following:    Lipase 67 (*)    All other components within normal limits   Labs Review from 11/17/15 Labs Reviewed   LIPASE, BLOOD - Abnormal; Notable for the following:    Lipase  90 (*)     All other components within normal limits   COMPREHENSIVE METABOLIC PANEL -  Abnormal; Notable for the following:    Chloride  100 (*)     Glucose, Bld  106 (*)     Total Protein  8.9 (*)     AST  47 (*)     All other components within normal limits   CBC   URINALYSIS, ROUTINE W REFLEX MICROSCOPIC (NOT AT [facility])     Imaging Review No results found.   I have personally reviewed and evaluated these images and lab results as part of my medical decision-making.   EKG Interpretation None      MDM   Final diagnoses:  Chronic abdominal pain    45 year old male presents to the emergency department for continued complaints of left upper quadrant abdominal pain which she attributes to his pancreatitis. He was seen approximately 12 hours ago at [facility] emergency department for similar complaints and was discharged following a reassuring workup. Patient is concerned that his pancreatitis is worsening. He has no evidence of acute surgical abdomen on exam. He is afebrile with stable vital signs.  Lipase rechecked and found to be improved from prior visit. No leukocytosis 12 hours PTA. Doubt emergent process. Suspect exacerbation of known chronic abdominal pain. Patient tolerating PO fluids without emesis. Will refer back to PCP. Resource guide given for outpatient ETOH facilities for rehab. Return precautions discussed and provided. Patient discharged in satisfactory condition with no unaddressed concerns.   Filed Vitals:   11/18/15 0128 11/18/15 0501  BP: 150/107 148/95  Pulse: 96 86  Temp: 98.1 F (36.7 C)   TempSrc: Oral   Resp: 16 18  Height: 5\' 8"  (1.727 m)   Weight: 64.864 kg   SpO2: 98% 99%     Antonietta Breach, PA-C 11/18/15 0506  April Palumbo, MD 11/18/15 [phone]

## 2015-11-18 NOTE — ED Notes (Signed)
Pt was brought in by EMS with c/o abd pain related to his pancreatitis  Pt states he was seen at [facility] around 11 am on Tuesday and was given a GI cocktail, carafate, and something else for pain  Pt states when he got home he ate some mashed potatoes and some italian ice  Pt states his pain became worse

## 2015-11-18 NOTE — Discharge Instructions (Signed)
Acute Pancreatitis Acute pancreatitis is a disease in which the pancreas becomes suddenly inflamed. The pancreas is a large gland located behind your stomach. The pancreas produces enzymes that help digest food. The pancreas also releases the hormones glucagon and insulin that help regulate blood sugar. Damage to the pancreas occurs when the digestive enzymes from the pancreas are activated and begin attacking the pancreas before being released into the intestine. Most acute attacks last a couple of days and can cause serious complications. Some people become dehydrated and develop low blood pressure. In severe cases, bleeding into the pancreas can lead to shock and can be life-threatening. The lungs, heart, and kidneys may fail. CAUSES  Pancreatitis can happen to anyone. In some cases, the cause is unknown. Most cases are caused by:  Alcohol abuse.  Gallstones. Other less common causes are:  Certain medicines.  Exposure to certain chemicals.  Infection.  Damage caused by an accident (trauma).  Abdominal surgery. SYMPTOMS   Pain in the upper abdomen that may radiate to the back.  Tenderness and swelling of the abdomen.  Nausea and vomiting. DIAGNOSIS  Your caregiver will perform a physical exam. Blood and stool tests may be done to confirm the diagnosis. Imaging tests may also be done, such as X-rays, CT scans, or an ultrasound of the abdomen. TREATMENT  Treatment usually requires a stay in the hospital. Treatment may include:  Pain medicine.  Fluid replacement through an intravenous line (IV).  Placing a tube in the stomach to remove stomach contents and control vomiting.  Not eating for 3 or 4 days. This gives your pancreas a rest, because enzymes are not being produced that can cause further damage.  Antibiotic medicines if your condition is caused by an infection.  Surgery of the pancreas or gallbladder. HOME CARE INSTRUCTIONS   Follow the diet advised by your  caregiver. This may involve avoiding alcohol and decreasing the amount of fat in your diet.  Eat smaller, more frequent meals. This reduces the amount of digestive juices the pancreas produces.  Drink enough fluids to keep your urine clear or pale yellow.  Only take over-the-counter or prescription medicines as directed by your caregiver.  Avoid drinking alcohol if it caused your condition.  Do not smoke.  Get plenty of rest.  Check your blood sugar at home as directed by your caregiver.  Keep all follow-up appointments as directed by your caregiver. SEEK MEDICAL CARE IF:   You do not recover as quickly as expected.  You develop new or worsening symptoms.  You have persistent pain, weakness, or nausea.  You recover and then have another episode of pain. SEEK IMMEDIATE MEDICAL CARE IF:   You are unable to eat or keep fluids down.  Your pain becomes severe.  You have a fever or persistent symptoms for more than 2 to 3 days.  You have a fever and your symptoms suddenly get worse.  Your skin or the white part of your eyes turn yellow (jaundice).  You develop vomiting.  You feel dizzy, or you faint.  Your blood sugar is high (over 300 mg/dL). MAKE SURE YOU:   Understand these instructions.  Will watch your condition.  Will get help right away if you are not doing well or get worse.   This information is not intended to replace advice given to you by your health care provider. Make sure you discuss any questions you have with your health care provider.   Document Released: 04/25/2005 Document Revised: 10/25/2011  Document Reviewed: 08/04/2011 Elsevier Interactive Patient Education 2016 [facility]/Substance Abuse Adult The United Ways 211 is a great source of information about community services available.  Access by dialing 2-1-1 from anywhere in [place], or by website -  CustodianSupply.fi.   Other Local  Resources (Updated 05/2015)  [facility]  Crisis Hotline, available 24 hours a day, 7 days a week: [place], [place]   [facility]  Crisis Hotline, available 24 hours a day, 7 days a week: [place], [place]  [facility]  Suicide Prevention Hotline, available 24 hours a day, 7 days a week: [place], [place], available 24 hours a day, 7 days a week: [place], [place] Access to [employer], available 24 hours a day, 7 days a week: 628-552-7431 All   [facility]  Crisis Hotline, available 24 hours a day, 7 days a week: 423-113-4635 All   Other Local Resources (Updated 05/2015)  Outpatient Counseling/ Substance Abuse Programs  Services     Address and Phone Number  ADS (Alcohol and Drug Services)   Options include Individual counseling, group counseling, intensive outpatient program (several hours a day, several days a week)  Offers depression assessments  Provides methadone maintenance program [phone] [address] [place], [place] partial hospitalization/day treatment and DUI/DWI programs  [employer], private insurance [phone] [phone] [place], [place] [address]  [place] include intensive outpatient program (several hours a day, several days a week), outpatient treatment, DUI/DWI services, family education  Also has some services specifically for [employer] transitional housing  [phone] [address] [place], [place] [address]     [place] Medicare, private pay, and private insurance [phone] [address] [place], [place] [address]  [facility]  Services  include individual counseling, substance abuse intensive outpatient program (several hours a day, several days a week), day treatment  Blinda Leatherwood, Medicaid, private insurance [phone] [address], [place], [place] [address]  [facility]   Offers substance abuse intensive outpatient program (several hours a day, several days a week), partial hospitalization program [phone] [address] [place], [place] [address]  [phone] [address] [place], [place] [address]  [phone] [place], [place] [address]  [phone], [place], [place] [address]  [facility]  Individual counseling only  Accepts private insurance only [phone] [address], [place], [place] [address]  [facility]: Methadone Clinic  Methadone maintenance program [phone] [address] [place], [place] [address]  [facility] providing substance abuse and mental health counseling  Accepts Medicaid, Medicare, private insurance  Offers sliding scale for uninsured [phone] [place], [place] in [place] individual counseling, and intensive in-home services [phone] [address] [place], [place] [address]  [facility] [place] individual counseling, family counseling, group therapy, domestic violence counseling, consumer credit counseling  Accepts Medicare, Medicaid, private insurance  Offers sliding scale for uninsured [phone] [address] [place], [place] [address]  [place], [address]  [place], [employer]  [facility]  Offers individual, family and group counseling  3 locations - [place], [place], and [place]  [address] [place], [place] [address]  [address] [place], [place]  [address]  [place], [place] [address]  [facility] [place] [facility]    Offers psychiatric assessment, 8-week Intensive Outpatient Program (several hours a day, several times a week, daytime or evenings), early recovery group, family Program, medication management  Private pay or private insurance only [phone], or  [phone] [address] [place], [place] [address]  [facility]  Offers individual, couples and family counseling  Accepts Medicaid, private insurance, and sliding scale for uninsured [phone] [address] [place], [place] [address]  Launa Flight, MD  Individual counseling  Private insurance [phone] [place], [place] [address]  [facility]   Offers assessment, substance abuse treatment, and behavioral health treatment [phone] [address] [place], [place] [address]  [place]  Individual counseling  Accepts private insurance [phone] [place], [place] [address]  [facility]  Individual counseling  Blinda Leatherwood, private insurance [phone] [place], [place] [address]  [place]    Offers intensive outpatient program (several hours a day, several times a week)  Private pay, private insurance [phone] [place], [place]  Individual counseling  Medicare, private insurance [phone] [address] [place], [place] [address]  [place]    Offers intensive outpatient program (several hours a day, several times a week) and partial hospitalization program [phone] [place], [place] [address]  Letta Moynahan, MD  Individual counseling [phone] [address], [place], [place] [address]  [place] counseling to  individuals, couples, and families  Accepts Medicare and private insurance; offers sliding scale for uninsured [phone] [place], [place] [address]  [facility]  Christian counseling [phone] [address], [place], [place] [address]  [facility] [employer] crisis counseling, individual counseling, group therapy, in-home therapy, domestic violence services, day treatment, DWI services, Conservation officer, nature (CST), Assertive Community Treatment Team (ACTT), substance abuse Intensive Outpatient Program (several hours a day, several times a week)  2 locations - [place] and [place], [place] [address]  [phone] [address] [place], [place] [address]  [place] counseling and group therapy  [place] insurance, [place], [place] [phone] [address] [place], [place]  [facility]  Offers individual and family counseling  Offers LGBTQ services  Accepts private insurance and private pay [phone] [place], [place] [address]  [facility]    Offers individual counseling, group therapy, and outpatient detox  Accepts private insurance [phone] [place], [place] Medicare, private insurance [phone] [address] [place], [place] [address]  [employer]  Individual counseling  Accepts Medicare, private insurance [phone] [address] [place] [place],  [address]  [facility] [place] substance abuse Intensive Outpatient Program (several hours a day, several times a week) [phone], or [phone] [place], [place] [facility]/Residential  Substance Abuse Treatment Adults The United Ways 211 is a  great source of information about community  services available.  Access by dialing 2-1-1 from anywhere in [place], or by website -  CustodianSupply.fi.   (Updated 05/2015)  Crisis Assistance 24 hours a day   [place]  24-hour crisis assistance: [place], [place]   [facility]  24-hour crisis [place], [place]   24-hour crisis assistance: [place], [place] Access to Care Line  24-hour crisis assistance; [phone] All   [facility]  24-hour crisis response line: [phone] All   Other Local Resources (Updated 05/2015)  Inpatient Behavioral Health/Residential Substance Abuse Treatment Programs   Services      Address and Phone Number  [place] ([place])   14-day residential rehabilitation  [phone] [address] [place], [place] ([place])    Detox - private pay only  14-day residential rehabilitation -  Medicaid, insurance, private pay only [phone], or [place], [place], [place] [address]   [place] only  Multiple facilities [phone] admissions   [facility] ([place])   90-day program  Must be homeless to participate  [phone], or [place], [place] only [phone], or  [place], [place] [address]  [place]     Must make an appointment  Transportation is offered from [place] on [employer].  Accepts private pay, [place] So [facility] [phone]  [address] [place], [place] [address]   [employer]  Females only  Associated with the [place] [phone] [place], [place] [address]  [facility]   Private insurance only [phone], or [phone] [address] [place], [id number]  [place]    Detox  Residential rehabilitation  Private insurance only  Multiple locations [phone] admissions  [place] of [place] pay  Private insurance [phone] [address] [place] [address]  [facility]    Males only  Fee required at time of admission [place], [place] [address]  [place] pay only  [phone] [address] [place], [place]  RTS (Residential Treatment Services)    Detox - private pay, Medicaid  Residential rehabilitation for males  - Medicare, Medicaid, insurance, private pay [phone] [place], [place] interviews Monday - Saturday from 8 am - 4 pm  Individuals with legal charges are not eligible [phone] [address] [place], [place] [address]  [facility]   Must be willing to work  Must attend Alcoholics Anonymous meetings [phone] [address] [place], [place]    Faith-based program  Private pay only [phone] [place], [place]

## 2015-12-01 ENCOUNTER — Emergency Department (HOSPITAL_COMMUNITY)
Admission: EM | Admit: 2015-12-01 | Discharge: 2015-12-01 | Disposition: A | Discharge status: Home / Self Care | Payer: Self-pay | Attending: Emergency Medicine | Admitting: Emergency Medicine

## 2015-12-01 ENCOUNTER — Encounter (HOSPITAL_COMMUNITY): Payer: Self-pay | Admitting: Emergency Medicine

## 2015-12-01 DIAGNOSIS — J45909 Unspecified asthma, uncomplicated: Secondary | ICD-10-CM | POA: Insufficient documentation

## 2015-12-01 DIAGNOSIS — F1010 Alcohol abuse, uncomplicated: Secondary | ICD-10-CM | POA: Insufficient documentation

## 2015-12-01 DIAGNOSIS — R740 Nonspecific elevation of levels of transaminase and lactic acid dehydrogenase [LDH]: Secondary | ICD-10-CM | POA: Insufficient documentation

## 2015-12-01 DIAGNOSIS — F17210 Nicotine dependence, cigarettes, uncomplicated: Secondary | ICD-10-CM | POA: Insufficient documentation

## 2015-12-01 DIAGNOSIS — K2920 Alcoholic gastritis without bleeding: Secondary | ICD-10-CM | POA: Insufficient documentation

## 2015-12-01 DIAGNOSIS — I10 Essential (primary) hypertension: Secondary | ICD-10-CM | POA: Insufficient documentation

## 2015-12-01 DIAGNOSIS — F319 Bipolar disorder, unspecified: Secondary | ICD-10-CM | POA: Insufficient documentation

## 2015-12-01 DIAGNOSIS — R7401 Elevation of levels of liver transaminase levels: Secondary | ICD-10-CM

## 2015-12-01 DIAGNOSIS — Z79899 Other long term (current) drug therapy: Secondary | ICD-10-CM | POA: Insufficient documentation

## 2015-12-01 LAB — COMPREHENSIVE METABOLIC PANEL
ALBUMIN: 3.7 g/dL (ref 3.5–5.0)
ALK PHOS: 96 U/L (ref 38–126)
ALT: 64 U/L — AB (ref 17–63)
ANION GAP: 7 (ref 5–15)
AST: 52 U/L — ABNORMAL HIGH (ref 15–41)
BILIRUBIN TOTAL: 0.7 mg/dL (ref 0.3–1.2)
BUN: 7 mg/dL (ref 6–20)
CALCIUM: 9.6 mg/dL (ref 8.9–10.3)
CO2: 25 mmol/L (ref 22–32)
CREATININE: 0.59 mg/dL — AB (ref 0.61–1.24)
Chloride: 105 mmol/L (ref 101–111)
GFR calc Af Amer: >60 mL/min (ref >60)
GFR calc non Af Amer: >60 mL/min (ref >60)
GLUCOSE: 89 mg/dL (ref 65–99)
Potassium: 4 mmol/L (ref 3.5–5.1)
Sodium: 137 mmol/L (ref 135–145)
TOTAL PROTEIN: 7.1 g/dL (ref 6.5–8.1)

## 2015-12-01 LAB — CBC
HCT: 36.6 % — ABNORMAL LOW (ref 39.0–52.0)
HEMOGLOBIN: 11.9 g/dL — AB (ref 13.0–17.0)
MCH: 31.1 pg (ref 26.0–34.0)
MCHC: 32.5 g/dL (ref 30.0–36.0)
MCV: 95.6 fL (ref 78.0–100.0)
PLATELETS: 247 10*3/uL (ref 150–400)
RBC: 3.83 MIL/uL — ABNORMAL LOW (ref 4.22–5.81)
RDW: 13.4 % (ref 11.5–15.5)
WBC: 9.5 10*3/uL (ref 4.0–10.5)

## 2015-12-01 LAB — LIPASE, BLOOD: LIPASE: 28 U/L (ref 11–51)

## 2015-12-01 MED ORDER — SUCRALFATE 1 G PO TABS: 1.0000 g | ORAL_TABLET | Freq: Three times a day (TID) | ORAL | 0 refills | Status: DC

## 2015-12-01 MED ORDER — MECLIZINE HCL 25 MG PO TABS: 25.0000 mg | ORAL_TABLET | Freq: Three times a day (TID) | ORAL | 0 refills | Status: DC | PRN

## 2015-12-01 MED ORDER — OMEPRAZOLE 20 MG PO CPDR: 20.0000 mg | DELAYED_RELEASE_CAPSULE | Freq: Every day | ORAL | 1 refills | Status: DC

## 2015-12-01 MED ORDER — GI COCKTAIL ~~LOC~~
30.0000 mL | Freq: Once | ORAL | Status: AC
  Administered 2015-12-01: 30 mL via ORAL
  Filled 2015-12-01: qty 30

## 2015-12-01 MED ORDER — DICYCLOMINE HCL 20 MG PO TABS: 20.0000 mg | ORAL_TABLET | Freq: Four times a day (QID) | ORAL | 0 refills | Status: DC

## 2015-12-01 NOTE — ED Notes (Signed)
PA at bedside.

## 2015-12-01 NOTE — ED Provider Notes (Signed)
[facility] DEPT Provider Note   CSN: [id number] Arrival date & time: 12/01/15  1159  First Provider Contact:  First MD Initiated Contact with Patient 12/01/15 1340        History   Chief Complaint Chief Complaint  Patient presents with  . Abdominal Pain    HPI [name] is a 45 y.o. male WHO presents for c/o RUQ and epigastric pain . IHe has 26 visitys in the last 6 months for chronic abdominal pain. He is also a chronic ETOH/ marijuana and tobacco abuser. His pain is intermittent, dull sometisme sharp. Worse with lying flat. Radiates to LUQ and back. +  diarrhea, 1 episode of vomiting this morning. Pain is worse pos prandially. No SOB, CP, blood in stool. He has been using immodium withou relief.   HPI    Past Medical History:  Diagnosis Date  . Alcoholism /alcohol abuse ([place])   . Anemia   . Anxiety   . Arthritis    "knees; arms; elbows" (03/26/2015)  . Asthma   . Bipolar disorder ([place])   . Chronic bronchitis ([place])   . Chronic lower back pain   . Cocaine abuse   . Depression   . Family history of adverse reaction to anesthesia    "grandmother gets confused"  . Femoral condyle fracture ([place]) 03/08/2014   left medial/notes 03/09/2014  . GERD (gastroesophageal reflux disease)   . H/O hiatal hernia   . H/O suicide attempt 10/2012  . Heart murmur    "when he was little" (03/06/2013)  . High cholesterol   . History of blood transfusion 10/2012   "when I tried to commit suicide"  . History of stomach ulcers   . Hypertension   . Marijuana abuse, continuous   . Migraine    "a few times/year" (03/26/2015)  . Pancreatitis   . Pneumonia 1990's X 3  . PTSD (post-traumatic stress disorder)   . Shortness of breath    "can happen at anytime" (03/06/2013)  . Sickle cell trait ([place])   . WPW (Wolff-Parkinson-White syndrome)    [name] 03/06/2013    Patient Active Problem List   Diagnosis Date Noted  . Leukocytosis   . Hospital acquired PNA 05/20/2015  .  Pancreatitis 05/18/2015  . Pseudocyst of pancreas 05/18/2015  . Malnutrition of moderate degree 03/27/2015  . Abnormal transaminases 03/26/2015  . Polysubstance abuse (tobacco, cocaine, THC, and ETOH) 03/26/2015  . Acute pancreatitis 03/26/2015  . Alcohol-induced chronic pancreatitis ([place])   . Essential hypertension 02/06/2014  . Alcohol-induced acute pancreatitis 11/28/2013  . Pancreatic pseudocyst/cyst 11/25/2013  . Alcohol dependence ([place]) 10/23/2013  . MDD (major depressive disorder), recurrent severe, without psychosis ([place]) 10/22/2013  . Protein-calorie malnutrition, severe ([place]) 10/10/2013  . Suicide attempt ([place]) 10/08/2013  . Overdose 10/06/2013  . 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 TOBACCO ABUSE 03/23/2007    Past Surgical History:  Procedure Laterality Date  . CARDIAC CATHETERIZATION    . EYE SURGERY Left 1990's   "result of trauma"   . FACIAL FRACTURE SURGERY Left 1990's   "result of trauma"   . FRACTURE SURGERY    . HERNIA REPAIR    . LEFT HEART CATHETERIZATION WITH CORONARY ANGIOGRAM Right 03/07/2013   Procedure: LEFT HEART CATHETERIZATION WITH CORONARY ANGIOGRAM;  Surgeon: [name], MD;  Location: [facility] CATH LAB;  Service: Cardiovascular;  Laterality: Right;  . UMBILICAL HERNIA REPAIR         Home Medications    Prior to  Admission medications   Medication Sig Start Date End Date Taking? Authorizing Provider  albuterol (PROVENTIL HFA;VENTOLIN HFA) 108 (90 BASE) MCG/ACT inhaler Inhale 2 puffs into the lungs every 6 (six) hours as needed for wheezing or shortness of breath (wheezing).     Historical Provider, MD  dicyclomine (BENTYL) 20 MG tablet Take 1 tablet (20 mg total) by mouth every 6 (six) hours. 12/01/15   Margarita Mail, PA-C  fluticasone (FLONASE) 50 MCG/ACT nasal spray Place 1 spray into both nostrils daily.    Historical Provider, MD  ibuprofen (ADVIL,MOTRIN) 200 MG tablet Take 200-800 mg by mouth every 6 (six)  hours as needed for mild pain or moderate pain.     Historical Provider, MD  lipase/protease/amylase (CREON) 12000 UNITS CPEP capsule Take 2 capsules (24,000 Units total) by mouth 3 (three) times daily with meals. 01/15/15   [name], MD  meclizine (ANTIVERT) 25 MG tablet Take 1 tablet (25 mg total) by mouth 3 (three) times daily as needed for nausea. 12/01/15   Margarita Mail, PA-C  metoprolol tartrate (LOPRESSOR) 25 MG tablet Take 1 tablet (25 mg total) by mouth 2 (two) times daily. 01/15/15   [name], MD  omeprazole (PRILOSEC) 20 MG capsule Take 1 capsule (20 mg total) by mouth daily. 11/15/15   [place] Lions, PA-C  omeprazole (PRILOSEC) 20 MG capsule Take 1 capsule (20 mg total) by mouth daily. 12/01/15   Margarita Mail, PA-C  ondansetron (ZOFRAN ODT) 8 MG disintegrating tablet Take 1 tablet (8 mg total) by mouth every 8 (eight) hours as needed for nausea or vomiting. Patient not taking: Reported on 11/15/2015 10/18/15   [name], PA-C  ondansetron (ZOFRAN) 4 MG tablet Take 1 tablet (4 mg total) by mouth every 6 (six) hours as needed for nausea or vomiting. Patient not taking: Reported on 11/17/2015 11/15/15   [place] Lions, PA-C  oxyCODONE-acetaminophen (PERCOCET/ROXICET) 5-325 MG tablet Take 1 tablet by mouth every 6 (six) hours as needed for severe pain. Patient not taking: Reported on 11/15/2015 08/26/15   Dalia Heading, PA-C  Phenylephrine-APAP-Guaifenesin [facility] SINUS-MAX CONGESTION PO) Take 1 tablet by mouth 2 (two) times daily as needed (for congestion).    Historical Provider, MD  promethazine (PHENERGAN) 25 MG tablet Take 1 tablet (25 mg total) by mouth every 8 (eight) hours as needed for nausea or vomiting. 08/26/15   [name], PA-C  QUEtiapine (SEROQUEL) 50 MG tablet Take 100 mg by mouth at bedtime. 01/15/15   Historical Provider, MD  ranitidine (ZANTAC) 150 MG tablet Take 1 tablet (150 mg total) by mouth 2 (two) times daily. Patient not  taking: Reported on 11/15/2015 08/24/15   [name], MD  sertraline (ZOLOFT) 25 MG tablet Take 75 mg by mouth daily.    Historical Provider, MD  sildenafil (VIAGRA) 50 MG tablet Take 1 tablet (50 mg total) by mouth daily as needed for erectile dysfunction. 03/31/15   [name], MD  sucralfate (CARAFATE) 1 g tablet Take 1 tablet (1 g total) by mouth 4 (four) times daily -  with meals and at bedtime. 11/15/15    Lions, PA-C  sucralfate (CARAFATE) 1 g tablet Take 1 tablet (1 g total) by mouth 4 (four) times daily -  with meals and at bedtime. 12/01/15   Margarita Mail, PA-C    Family History Family History  Problem Relation Age of Onset  . Hypertension Other   . Coronary artery disease Other     Social History Social  History  Substance Use Topics  . Smoking status: Current Every Day Smoker    Packs/day: 1.00    Years: 30.00    Types: Cigarettes  . Smokeless tobacco: Current User    Types: Chew  . Alcohol use 37.8 oz/week    63 Cans of beer per week     Allergies   Shellfish-derived products; Trazodone and nefazodone; and Reglan [metoclopramide]   Review of Systems Review of Systems  Ten systems reviewed and are negative for acute change, except as noted in the HPI.   Physical Exam Updated Vital Signs BP 148/87   Pulse 96   Temp 98.5 F (36.9 C) (Oral)   Resp 16   Ht 5\' 9"  (1.753 m)   Wt 63.5 kg   SpO2 99%   BMI 20.67 kg/m   Physical Exam  Constitutional: He appears well-developed and well-nourished. No distress.  HENT:  Head: Normocephalic and atraumatic.  Eyes: Conjunctivae are normal. No scleral icterus.  Neck: Normal range of motion. Neck supple.  Cardiovascular: Normal rate, regular rhythm and normal heart sounds.   Pulmonary/Chest: Effort normal and breath sounds normal. No respiratory distress.  Abdominal: Soft. There is tenderness (RUQ epigastrium).  Musculoskeletal: He exhibits no edema.  Neurological: He is alert.  Skin: Skin  is warm and dry. He is not diaphoretic.  Psychiatric: His behavior is normal.  Nursing note and vitals reviewed.    ED Treatments / Results  Labs (all labs ordered are listed, but only abnormal results are displayed) Labs Reviewed  COMPREHENSIVE METABOLIC PANEL - Abnormal; Notable for the following:       Result Value   Creatinine, Ser 0.59 (*)    AST 52 (*)    ALT 64 (*)    All other components within normal limits  CBC - Abnormal; Notable for the following:    RBC 3.83 (*)    Hemoglobin 11.9 (*)    HCT 36.6 (*)    All other components within normal limits  LIPASE, BLOOD    EKG  EKG Interpretation None       Radiology No results found.  Procedures Procedures (including critical care time)  Medications Ordered in ED Medications  gi cocktail (Maalox,Lidocaine,Donnatal) (30 mLs Oral Given 12/01/15 1438)     Initial Impression / Assessment and Plan / ED Course  I have reviewed the triage vital signs and the nursing notes.  Pertinent labs & imaging results that were available during my care of the patient were reviewed by me and considered in my medical decision making (see chart for details).  Clinical Course  Value Comment By Time  AST: (!) 52 (Reviewed) Margarita Mail, PA-C 07/25 1346  ALT: (!) 64 Patient with transaminitis- they are chronically elevated. Lipase is negative. This is likely etoh gastritis. Will give GI cocktail .  Margarita Mail, PA-C 07/25 1347   Patient improved with treatment.  Will dc with treatment for suspected gastritis. I have discussed ETOH abuse, cessation. Will dc to pcp, gi follow up . The patient appears reasonably screened and/or stabilized for discharge and I doubt any other medical condition or other [facility] requiring further screening, evaluation, or treatment in the ED at this time prior to discharge.  Margarita Mail, PA-C 07/25 1406     Final Clinical Impressions(s) / ED Diagnoses   Final diagnoses:  Alcoholic gastritis    Alcohol abuse  Transaminitis    New Prescriptions Discharge Medication List as of 12/01/2015  2:53 PM    START taking  these medications   Details  dicyclomine (BENTYL) 20 MG tablet Take 1 tablet (20 mg total) by mouth every 6 (six) hours., Starting Tue 12/01/2015, Print    meclizine (ANTIVERT) 25 MG tablet Take 1 tablet (25 mg total) by mouth 3 (three) times daily as needed for nausea., Starting Tue 12/01/2015, Print    !! omeprazole (PRILOSEC) 20 MG capsule Take 1 capsule (20 mg total) by mouth daily., Starting Tue 12/01/2015, Print    !! sucralfate (CARAFATE) 1 g tablet Take 1 tablet (1 g total) by mouth 4 (four) times daily -  with meals and at bedtime., Starting Tue 12/01/2015, Print     !! - Potential duplicate medications found. Please discuss with provider.       Margarita Mail, PA-C 12/01/15 [place], MD 12/01/15 [phone]

## 2015-12-01 NOTE — ED Triage Notes (Signed)
Per EMS: pt sts RUQ pain starting this am; pt with hx of pancreatitis and feels same

## 2015-12-04 ENCOUNTER — Encounter (HOSPITAL_COMMUNITY): Payer: Self-pay | Admitting: Emergency Medicine

## 2015-12-04 ENCOUNTER — Emergency Department (HOSPITAL_COMMUNITY)
Admission: EM | Admit: 2015-12-04 | Discharge: 2015-12-04 | Disposition: A | Discharge status: Home / Self Care | Payer: Self-pay | Attending: Physician Assistant | Admitting: Physician Assistant

## 2015-12-04 DIAGNOSIS — R1011 Right upper quadrant pain: Secondary | ICD-10-CM | POA: Insufficient documentation

## 2015-12-04 DIAGNOSIS — F17210 Nicotine dependence, cigarettes, uncomplicated: Secondary | ICD-10-CM | POA: Insufficient documentation

## 2015-12-04 DIAGNOSIS — J45909 Unspecified asthma, uncomplicated: Secondary | ICD-10-CM | POA: Insufficient documentation

## 2015-12-04 DIAGNOSIS — R197 Diarrhea, unspecified: Secondary | ICD-10-CM | POA: Insufficient documentation

## 2015-12-04 DIAGNOSIS — Z79899 Other long term (current) drug therapy: Secondary | ICD-10-CM | POA: Insufficient documentation

## 2015-12-04 DIAGNOSIS — G8929 Other chronic pain: Secondary | ICD-10-CM | POA: Insufficient documentation

## 2015-12-04 DIAGNOSIS — I10 Essential (primary) hypertension: Secondary | ICD-10-CM | POA: Insufficient documentation

## 2015-12-04 DIAGNOSIS — R109 Unspecified abdominal pain: Secondary | ICD-10-CM

## 2015-12-04 MED ORDER — DIPHENOXYLATE-ATROPINE 2.5-0.025 MG PO TABS: 1.0000 | ORAL_TABLET | Freq: Four times a day (QID) | ORAL | 0 refills | Status: DC | PRN

## 2015-12-04 MED ORDER — DIPHENOXYLATE-ATROPINE 2.5-0.025 MG PO TABS
1.0000 | ORAL_TABLET | Freq: Once | ORAL | Status: AC
Start: 2015-12-04 — End: 2015-12-04
  Administered 2015-12-04: 1 via ORAL
  Filled 2015-12-04: qty 1

## 2015-12-04 MED ORDER — GI COCKTAIL ~~LOC~~
30.0000 mL | Freq: Once | ORAL | Status: AC
  Administered 2015-12-04: 30 mL via ORAL
  Filled 2015-12-04: qty 30

## 2015-12-04 NOTE — ED Notes (Addendum)
Pt provided with d/c instructions at this time. Pt verbalizes understanding of d/c instructions as well as follow up procedure after d/c.  Pt provided with RX x 1 at time of d/c.  Pt verbalizes understanding of RX directions at this time.  Pt in no apparent distress at this time.  Pt ambulatory at time of d/c.  Pt provided with bus pass at time of d/c.

## 2015-12-04 NOTE — Discharge Instructions (Signed)
Continue current medications. Stop drinking alcohol. Lomotil as needed for diarrhea. Follow-up with primary care doctor.

## 2015-12-04 NOTE — ED Provider Notes (Signed)
[facility] DEPT Provider Note   CSN: [id number] Arrival date & time: 12/04/15  1126  First Provider Contact:  First MD Initiated Contact with Patient 12/04/15 1508        History   Chief Complaint Chief Complaint  Patient presents with  . Abdominal Pain  . Diarrhea    HPI [name] is a 44 y.o. male.  HPI [name] is a 45 y.o. male with hx of alcohol abuse, chronic pancreatitis, bipolar disorder, cocaine abuse presents to ED with complaint of abdominal pain. Pt with worsening pain over last several days. States pain is mainly in the right upper quadrant. Pain is sharp. Reports associated nausea, no vomiting. Reports diarrhea for the last week. States has been taking his regular medications which include creon, bentyl, prilosec, carafate, states they are not helping. Also taking imodium for diarrhea which he states has made his stool just slightly more formed. Pt states his last alcohol drink was several days ago. States not eating or drinking. No fever, chills. No other complaints.   Past Medical History:  Diagnosis Date  . Alcoholism /alcohol abuse ([place])   . Anemia   . Anxiety   . Arthritis    "knees; arms; elbows" (03/26/2015)  . Asthma   . Bipolar disorder ([place])   . Chronic bronchitis ([place])   . Chronic lower back pain   . Cocaine abuse   . Depression   . Family history of adverse reaction to anesthesia    "grandmother gets confused"  . Femoral condyle fracture ([place]) 03/08/2014   left medial/notes 03/09/2014  . GERD (gastroesophageal reflux disease)   . H/O hiatal hernia   . H/O suicide attempt 10/2012  . Heart murmur    "when he was little" (03/06/2013)  . High cholesterol   . History of blood transfusion 10/2012   "when I tried to commit suicide"  . History of stomach ulcers   . Hypertension   . Marijuana abuse, continuous   . Migraine    "a few times/year" (03/26/2015)  . Pancreatitis   . Pneumonia 1990's X 3  . PTSD (post-traumatic stress  disorder)   . Shortness of breath    "can happen at anytime" (03/06/2013)  . Sickle cell trait ([place])   . WPW (Wolff-Parkinson-White syndrome)    [name] 03/06/2013    Patient Active Problem List   Diagnosis Date Noted  . Leukocytosis   . Hospital acquired PNA 05/20/2015  . Pancreatitis 05/18/2015  . Pseudocyst of pancreas 05/18/2015  . Malnutrition of moderate degree 03/27/2015  . Abnormal transaminases 03/26/2015  . Polysubstance abuse (tobacco, cocaine, THC, and ETOH) 03/26/2015  . Acute pancreatitis 03/26/2015  . Alcohol-induced chronic pancreatitis ([place])   . Essential hypertension 02/06/2014  . Alcohol-induced acute pancreatitis 11/28/2013  . Pancreatic pseudocyst/cyst 11/25/2013  . Alcohol dependence ([place]) 10/23/2013  . MDD (major depressive disorder), recurrent severe, without psychosis ([place]) 10/22/2013  . Protein-calorie malnutrition, severe ([place]) 10/10/2013  . Suicide attempt ([place]) 10/08/2013  . Overdose 10/06/2013  . 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 TOBACCO ABUSE 03/23/2007    Past Surgical History:  Procedure Laterality Date  . CARDIAC CATHETERIZATION    . EYE SURGERY Left 1990's   "result of trauma"   . FACIAL FRACTURE SURGERY Left 1990's   "result of trauma"   . FRACTURE SURGERY    . HERNIA REPAIR    . LEFT HEART CATHETERIZATION WITH CORONARY ANGIOGRAM Right 03/07/2013   Procedure: LEFT HEART CATHETERIZATION WITH  CORONARY ANGIOGRAM;  Surgeon: [name], MD;  Location: [facility] CATH LAB;  Service: Cardiovascular;  Laterality: Right;  . UMBILICAL HERNIA REPAIR         Home Medications    Prior to Admission medications   Medication Sig Start Date End Date Taking? Authorizing Provider  albuterol (PROVENTIL HFA;VENTOLIN HFA) 108 (90 BASE) MCG/ACT inhaler Inhale 2 puffs into the lungs every 6 (six) hours as needed for wheezing or shortness of breath (wheezing).    Yes Historical Provider, MD  dicyclomine (BENTYL) 20 MG  tablet Take 1 tablet (20 mg total) by mouth every 6 (six) hours. 12/01/15  Yes Margarita Mail, PA-C  ibuprofen (ADVIL,MOTRIN) 200 MG tablet Take 200-800 mg by mouth every 6 (six) hours as needed for mild pain or moderate pain.    Yes Historical Provider, MD  lipase/protease/amylase (CREON) 12000 UNITS CPEP capsule Take 2 capsules (24,000 Units total) by mouth 3 (three) times daily with meals. 01/15/15  Yes [name], MD  meclizine (ANTIVERT) 25 MG tablet Take 1 tablet (25 mg total) by mouth 3 (three) times daily as needed for nausea. 12/01/15  Yes Margarita Mail, PA-C  metoprolol tartrate (LOPRESSOR) 25 MG tablet Take 1 tablet (25 mg total) by mouth 2 (two) times daily. 01/15/15  Yes [name], MD  omeprazole (PRILOSEC) 20 MG capsule Take 1 capsule (20 mg total) by mouth daily. 11/15/15  Yes [place] Lions, PA-C  Phenylephrine-APAP-Guaifenesin [facility] SINUS-MAX CONGESTION PO) Take 1 tablet by mouth 2 (two) times daily as needed (for congestion).   Yes Historical Provider, MD  QUEtiapine (SEROQUEL) 50 MG tablet Take 100 mg by mouth at bedtime. 01/15/15  Yes Historical Provider, MD  sertraline (ZOLOFT) 25 MG tablet Take 75 mg by mouth daily.   Yes Historical Provider, MD  sildenafil (VIAGRA) 50 MG tablet Take 1 tablet (50 mg total) by mouth daily as needed for erectile dysfunction. 03/31/15  Yes [place]ug[place] Essie Christine, MD  sucralfate (CARAFATE) 1 g tablet Take 1 tablet (1 g total) by mouth 4 (four) times daily -  with meals and at bedtime. 11/15/15  Yes [place] Lions, PA-C  fluticasone (FLONASE) 50 MCG/ACT nasal spray Place 1 spray into both nostrils daily as needed for allergies.     Historical Provider, MD  ondansetron (ZOFRAN ODT) 8 MG disintegrating tablet Take 1 tablet (8 mg total) by mouth every 8 (eight) hours as needed for nausea or vomiting. Patient not taking: Reported on 11/15/2015 10/18/15   Lonisha Bobby, PA-C  ondansetron (ZOFRAN) 4 MG tablet Take 1 tablet (4 mg  total) by mouth every 6 (six) hours as needed for nausea or vomiting. Patient not taking: Reported on 11/17/2015 11/15/15   [place] Lions, PA-C  oxyCODONE-acetaminophen (PERCOCET/ROXICET) 5-325 MG tablet Take 1 tablet by mouth every 6 (six) hours as needed for severe pain. Patient not taking: Reported on 11/15/2015 08/26/15   Dalia Heading, PA-C  promethazine (PHENERGAN) 25 MG tablet Take 1 tablet (25 mg total) by mouth every 8 (eight) hours as needed for nausea or vomiting. Patient not taking: Reported on 12/04/2015 08/26/15   Dalia Heading, PA-C  ranitidine (ZANTAC) 150 MG tablet Take 1 tablet (150 mg total) by mouth 2 (two) times daily. Patient not taking: Reported on 11/15/2015 08/24/15   [name], MD    Family History Family History  Problem Relation Age of Onset  . Hypertension Other   . Coronary artery disease Other     Social History Social History  Substance Use Topics  . Smoking status: Current Every Day Smoker    Packs/day: 1.00    Years: 30.00    Types: Cigarettes  . Smokeless tobacco: Current User    Types: Chew  . Alcohol use 37.8 oz/week    63 Cans of beer per week     Allergies   Shellfish-derived products; Trazodone and nefazodone; and Reglan [metoclopramide]   Review of Systems Review of Systems  Constitutional: Negative for chills and fever.  Respiratory: Negative for cough, chest tightness and shortness of breath.   Cardiovascular: Negative for chest pain, palpitations and leg swelling.  Gastrointestinal: Positive for abdominal pain, diarrhea and nausea. Negative for abdominal distention and vomiting.  Genitourinary: Negative for dysuria.  Musculoskeletal: Negative for arthralgias, myalgias, neck pain and neck stiffness.  Skin: Negative for rash.  Allergic/Immunologic: Negative for immunocompromised state.  Neurological: Negative for dizziness, weakness, light-headedness, numbness and headaches.  All other systems reviewed and are  negative.    Physical Exam Updated Vital Signs BP 133/90   Pulse 85   Temp 97.7 F (36.5 C) (Oral)   Resp 13   SpO2 100%   Physical Exam  Constitutional: He appears well-developed and well-nourished. No distress.  HENT:  Head: Normocephalic and atraumatic.  Eyes: Conjunctivae are normal.  Neck: Neck supple.  Cardiovascular: Normal rate, regular rhythm and normal heart sounds.   Pulmonary/Chest: Effort normal. No respiratory distress. He has no wheezes. He has no rales.  Abdominal: Soft. Bowel sounds are normal. He exhibits no distension. There is tenderness. There is no rebound.  RUQ tenderness. No guarding   Musculoskeletal: He exhibits no edema.  Neurological: He is alert.  Skin: Skin is warm and dry.  Nursing note and vitals reviewed.    ED Treatments / Results  Labs (all labs ordered are listed, but only abnormal results are displayed) Labs Reviewed - No data to display  EKG  EKG Interpretation  Date/Time:  Friday December 04 2015 11:32:34 EDT Ventricular Rate:  103 PR Interval:  124 QRS Duration: 64 QT Interval:  316 QTC Calculation: 413 R Axis:   75 Text Interpretation:  Sinus tachycardia Biatrial enlargement T wave abnormality, consider lateral ischemia Abnormal ECG Confirmed by Hazle Coca [phone]) on 12/04/2015 11:38:45 AM Also confirmed by Hazle Coca [phone]), editor [place] CT, Leda Gauze [phone])  on 12/04/2015 1:32:51 PM       Radiology No results found.  Procedures Procedures (including critical care time)  Medications Ordered in ED Medications  gi cocktail (Maalox,Lidocaine,Donnatal) (not administered)  diphenoxylate-atropine (LOMOTIL) 2.5-0.025 MG per tablet 1 tablet (not administered)     Initial Impression / Assessment and Plan / ED Course  I have reviewed the triage vital signs and the nursing notes.  Pertinent labs & imaging results that were available during my care of the patient were reviewed by me and considered in my medical decision making  (see chart for details).  Clinical Course  Comment By Time  Pt seen and examined. Pt with recurrent abdominal pain/pancreatitis. Pt with multiple visits for the same. Pt has had 27 visits to ED in the last 6 months for chronic abdominal pain. On exam, pt is in NAD, abdomen soft, no surgical abdomen. RUQ tenderness. Prior US showed normal gallbladder. Last visit, 3 days ago, show normal CBC, normal electrolytes, slightly elevated LFTS. Suspect alcoholic cirrhosis with acute on chronic pancreatitis. [name] care plan reviewed. I do not think he needs any blood work today. VS normal. He is not vomiting. Will try gi  cocktail and lomotil. Will reassess.  Jeannett Senior, PA-C 07/28 1614   Pt with chronic pancraetitis, alcohol abuse, here with recurrent pain. Well appearing. Abdomen benign. Improved with GI cocktail and lomotil. I do not think pt needs further workup on emergent basis at this time. He has an apt for orange card next week. Instructed to follow up, continue current medications. Stop drinking alcohol  Final Clinical Impressions(s) / ED Diagnoses   Final diagnoses:  Chronic abdominal pain    New Prescriptions New Prescriptions   DIPHENOXYLATE-ATROPINE (LOMOTIL) 2.5-0.025 MG TABLET    Take 1 tablet by mouth 4 (four) times daily as needed for diarrhea or loose stools.     Jeannett Senior, PA-C 12/04/15 [place], MD 12/08/15 1504

## 2015-12-04 NOTE — ED Triage Notes (Signed)
Pt from home with c/o ongoing diarrhea and RUQ pain.  Seen here this past Monday for the same.  NAD, A&O.

## 2015-12-07 ENCOUNTER — Ambulatory Visit: Payer: Self-pay | Admitting: Pediatrics

## 2015-12-09 ENCOUNTER — Encounter (HOSPITAL_COMMUNITY): Payer: Self-pay | Admitting: Emergency Medicine

## 2015-12-09 ENCOUNTER — Emergency Department (HOSPITAL_COMMUNITY)
Admission: EM | Admit: 2015-12-09 | Discharge: 2015-12-09 | Disposition: A | Discharge status: Home / Self Care | Payer: Self-pay | Attending: Emergency Medicine | Admitting: Emergency Medicine

## 2015-12-09 ENCOUNTER — Emergency Department (HOSPITAL_COMMUNITY): Payer: Self-pay

## 2015-12-09 DIAGNOSIS — S2232XA Fracture of one rib, left side, initial encounter for closed fracture: Secondary | ICD-10-CM | POA: Insufficient documentation

## 2015-12-09 DIAGNOSIS — I10 Essential (primary) hypertension: Secondary | ICD-10-CM | POA: Insufficient documentation

## 2015-12-09 DIAGNOSIS — J45909 Unspecified asthma, uncomplicated: Secondary | ICD-10-CM | POA: Insufficient documentation

## 2015-12-09 DIAGNOSIS — Z79899 Other long term (current) drug therapy: Secondary | ICD-10-CM | POA: Insufficient documentation

## 2015-12-09 DIAGNOSIS — Y999 Unspecified external cause status: Secondary | ICD-10-CM | POA: Insufficient documentation

## 2015-12-09 DIAGNOSIS — F17210 Nicotine dependence, cigarettes, uncomplicated: Secondary | ICD-10-CM | POA: Insufficient documentation

## 2015-12-09 DIAGNOSIS — Y939 Activity, unspecified: Secondary | ICD-10-CM | POA: Insufficient documentation

## 2015-12-09 DIAGNOSIS — W2203XA Walked into furniture, initial encounter: Secondary | ICD-10-CM | POA: Insufficient documentation

## 2015-12-09 DIAGNOSIS — Y929 Unspecified place or not applicable: Secondary | ICD-10-CM | POA: Insufficient documentation

## 2015-12-09 LAB — I-STAT TROPONIN, ED: Troponin i, poc: 0 ng/mL (ref 0.00–0.08)

## 2015-12-09 MED ORDER — HYDROCODONE-ACETAMINOPHEN 5-325 MG PO TABS
2.0000 | ORAL_TABLET | Freq: Once | ORAL | Status: AC
  Administered 2015-12-09: 2 via ORAL
  Filled 2015-12-09: qty 2

## 2015-12-09 MED ORDER — CYCLOBENZAPRINE HCL 5 MG PO TABS: 5.0000 mg | ORAL_TABLET | Freq: Three times a day (TID) | ORAL | 0 refills | Status: DC | PRN

## 2015-12-09 MED ORDER — CYCLOBENZAPRINE HCL 10 MG PO TABS
5.0000 mg | ORAL_TABLET | Freq: Once | ORAL | Status: AC
  Administered 2015-12-09: 5 mg via ORAL
  Filled 2015-12-09: qty 1

## 2015-12-09 MED ORDER — KETOROLAC TROMETHAMINE 15 MG/ML IJ SOLN
15.0000 mg | Freq: Once | INTRAMUSCULAR | Status: AC
  Administered 2015-12-09: 15 mg via INTRAVENOUS
  Filled 2015-12-09: qty 1

## 2015-12-09 MED ORDER — HYDROCODONE-ACETAMINOPHEN 5-325 MG PO TABS: 1.0000 | ORAL_TABLET | Freq: Four times a day (QID) | ORAL | 0 refills | Status: DC | PRN

## 2015-12-09 NOTE — ED Notes (Signed)
Pt able to ambulate in hallway with no assistance, and no complaints.

## 2015-12-09 NOTE — ED Provider Notes (Signed)
[facility] DEPT Provider Note   CSN: [id number] Arrival date & time: 12/09/15  [id number]  First Provider Contact:  First MD Initiated Contact with Patient 12/09/15 [phone]      History   Chief Complaint Chief Complaint  Patient presents with  . Chest Pain  . Loss of Consciousness    HPI [name] is a 45 y.o. male.  HPI  [name] is a 45 yo M with PMH EtOH abuse, chronic pancreatitis, Wolff Parkinson White syndrome, bipolar disorder, and cocaine abuse who presents to ED with complaint of LOC and chest pain. Reports episode of syncope last night around 3AM with subsequent fall. Patient remembers feeling dizzy, then picking himself up off the ground. He says he does not remember the event, but knows that he fell onto a step ladder and hit the L side of his chest. Denies head trauma. Presented to ED about an hour after the event when chest pain began. Describes the pain as located only on the L and constant in nature. Also reports palpitations and an "electric" sensation in his heart that he attributes to WPW and says is not new. Endorses pain with deep inspiration but denies SOB. Denies N/V/diaphoresis. Denies HA, but endorses some neck pain on the L when turning head. Endorses diarrhea, however this has been ongoing for at least a week.  Last reported EtOH use four days ago. Last reported cocaine use five days ago.   Past Medical History:  Diagnosis Date  . Alcoholism /alcohol abuse ([place])   . Anemia   . Anxiety   . Arthritis    "knees; arms; elbows" (03/26/2015)  . Asthma   . Bipolar disorder ([place])   . Chronic bronchitis ([place])   . Chronic lower back pain   . Cocaine abuse   . Depression   . Family history of adverse reaction to anesthesia    "grandmother gets confused"  . Femoral condyle fracture ([place]) 03/08/2014   left medial/notes 03/09/2014  . GERD (gastroesophageal reflux disease)   . H/O hiatal hernia   . H/O suicide attempt 10/2012  . Heart murmur    "when he  was little" (03/06/2013)  . High cholesterol   . History of blood transfusion 10/2012   "when I tried to commit suicide"  . History of stomach ulcers   . Hypertension   . Marijuana abuse, continuous   . Migraine    "a few times/year" (03/26/2015)  . Pancreatitis   . Pneumonia 1990's X 3  . PTSD (post-traumatic stress disorder)   . Shortness of breath    "can happen at anytime" (03/06/2013)  . Sickle cell trait ([place])   . WPW (Wolff-Parkinson-White syndrome)    [name] 03/06/2013    Patient Active Problem List   Diagnosis Date Noted  . Leukocytosis   . Hospital acquired PNA 05/20/2015  . Pancreatitis 05/18/2015  . Pseudocyst of pancreas 05/18/2015  . Malnutrition of moderate degree 03/27/2015  . Abnormal transaminases 03/26/2015  . Polysubstance abuse (tobacco, cocaine, THC, and ETOH) 03/26/2015  . Acute pancreatitis 03/26/2015  . Alcohol-induced chronic pancreatitis ([place])   . Essential hypertension 02/06/2014  . Alcohol-induced acute pancreatitis 11/28/2013  . Pancreatic pseudocyst/cyst 11/25/2013  . Alcohol dependence ([place]) 10/23/2013  . MDD (major depressive disorder), recurrent severe, without psychosis ([place]) 10/22/2013  . Protein-calorie malnutrition, severe ([place]) 10/10/2013  . Suicide attempt ([place]) 10/08/2013  . Overdose 10/06/2013  . 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 TOBACCO ABUSE 03/23/2007  Past Surgical History:  Procedure Laterality Date  . CARDIAC CATHETERIZATION    . EYE SURGERY Left 1990's   "result of trauma"   . FACIAL FRACTURE SURGERY Left 1990's   "result of trauma"   . FRACTURE SURGERY    . HERNIA REPAIR    . LEFT HEART CATHETERIZATION WITH CORONARY ANGIOGRAM Right 03/07/2013   Procedure: LEFT HEART CATHETERIZATION WITH CORONARY ANGIOGRAM;  Surgeon: [name], MD;  Location: [facility];  Service: Cardiovascular;  Laterality: Right;  . UMBILICAL HERNIA REPAIR       Home Medications    Prior to  Admission medications   Medication Sig Start Date End Date Taking? Authorizing Provider  albuterol (PROVENTIL HFA;VENTOLIN HFA) 108 (90 BASE) MCG/ACT inhaler Inhale 2 puffs into the lungs every 6 (six) hours as needed for wheezing or shortness of breath (wheezing).    Yes Historical Provider, MD  dicyclomine (BENTYL) 20 MG tablet Take 1 tablet (20 mg total) by mouth every 6 (six) hours. 12/01/15  Yes Margarita Mail, PA-C  diphenoxylate-atropine (LOMOTIL) 2.5-0.025 MG tablet Take 1 tablet by mouth 4 (four) times daily as needed for diarrhea or loose stools. 12/04/15  Yes [name], PA-C  fluticasone (FLONASE) 50 MCG/ACT nasal spray Place 1 spray into both nostrils daily as needed for allergies.    Yes Historical Provider, MD  ibuprofen (ADVIL,MOTRIN) 200 MG tablet Take 200-800 mg by mouth every 6 (six) hours as needed for mild pain or moderate pain.    Yes Historical Provider, MD  lipase/protease/amylase (CREON) 12000 UNITS CPEP capsule Take 2 capsules (24,000 Units total) by mouth 3 (three) times daily with meals. 01/15/15  Yes [name], MD  meclizine (ANTIVERT) 25 MG tablet Take 1 tablet (25 mg total) by mouth 3 (three) times daily as needed for nausea. 12/01/15  Yes Margarita Mail, PA-C  metoprolol tartrate (LOPRESSOR) 25 MG tablet Take 1 tablet (25 mg total) by mouth 2 (two) times daily. 01/15/15  Yes [name], MD  omeprazole (PRILOSEC) 20 MG capsule Take 1 capsule (20 mg total) by mouth daily. 11/15/15  Yes Rainbow Lions, PA-C  Phenylephrine-APAP-Guaifenesin [facility] SINUS-MAX CONGESTION PO) Take 1 tablet by mouth 2 (two) times daily as needed (for congestion).   Yes Historical Provider, MD  QUEtiapine (SEROQUEL) 50 MG tablet Take 100 mg by mouth at bedtime. 01/15/15  Yes Historical Provider, MD  sertraline (ZOLOFT) 25 MG tablet Take 75 mg by mouth daily.   Yes Historical Provider, MD  sildenafil (VIAGRA) 50 MG tablet Take 1 tablet (50 mg total) by mouth daily as needed  for erectile dysfunction. 03/31/15  Yes Olugbemiga Essie Christine, MD  sucralfate (CARAFATE) 1 g tablet Take 1 tablet (1 g total) by mouth 4 (four) times daily -  with meals and at bedtime. 11/15/15  Yes Riva Lions, PA-C  cyclobenzaprine (FLEXERIL) 5 MG tablet Take 1 tablet (5 mg total) by mouth 3 (three) times daily as needed for muscle spasms. 12/09/15   Verner Mould, MD  HYDROcodone-acetaminophen (NORCO) 5-325 MG tablet Take 1 tablet by mouth every 6 (six) hours as needed for severe pain. 12/09/15   Verner Mould, MD    Family History Family History  Problem Relation Age of Onset  . Hypertension Other   . Coronary artery disease Other     Social History Social History  Substance Use Topics  . Smoking status: Current Every Day Smoker    Packs/day: 1.00    Years: 30.00  Types: Cigarettes  . Smokeless tobacco: Current User    Types: Chew  . Alcohol use 37.8 oz/week    63 Cans of beer per week     Allergies   Shellfish-derived products; Trazodone and nefazodone; and Reglan [metoclopramide]   Review of Systems Review of Systems  Constitutional: Negative for diaphoresis.  Eyes: Negative for visual disturbance.  Respiratory: Negative for shortness of breath.        Pain with deep inspiration  Cardiovascular: Positive for chest pain and palpitations.  Gastrointestinal: Positive for diarrhea. Negative for nausea and vomiting.  Musculoskeletal: Positive for neck pain and neck stiffness. Negative for back pain.  Skin: Negative for wound.  Allergic/Immunologic: Negative for immunocompromised state.  Neurological: Positive for dizziness. Negative for weakness and headaches.    Physical Exam Updated Vital Signs BP 143/82 (BP Location: Right Arm)   Pulse 112   Temp 98.9 F (37.2 C) (Oral)   Resp 18   SpO2 99%   Physical Exam  Constitutional: He is oriented to person, place, and time.  Grimacing and clutching chest intermittently throughout encounter   HENT:  Head: Normocephalic and atraumatic.  Right Ear: External ear normal.  Left Ear: External ear normal.  Nose: Nose normal.  Mouth/Throat: Oropharynx is clear and moist. No oropharyngeal exudate.  Eyes: Conjunctivae and EOM are normal. Pupils are equal, round, and reactive to light. Left eye exhibits no discharge. No scleral icterus.  Neck: Normal range of motion.  TTP of L neck; reported pain of L neck when turning head to R  Cardiovascular: Normal rate, regular rhythm, normal heart sounds and intact distal pulses.   No murmur heard. Pulmonary/Chest: Effort normal and breath sounds normal. No respiratory distress. He has no wheezes. He has no rales. He exhibits tenderness (TTP of L chest wall; no ecchymosis, swelling or erythema noted).  Abdominal: Soft. Bowel sounds are normal. He exhibits no distension and no mass. There is tenderness. There is no rebound and no guarding.  Musculoskeletal: Normal range of motion. He exhibits no edema or tenderness.  Neurological: He is alert and oriented to person, place, and time. No cranial nerve deficit.  Skin: Skin is warm and dry.  Psychiatric: He has a normal mood and affect. His behavior is normal.    ED Treatments / Results  Labs (all labs ordered are listed, but only abnormal results are displayed) Labs Reviewed  Randolm Idol, ED    EKG  EKG Interpretation  Date/Time:  Wednesday December 09 2015 06:38:12 EDT Ventricular Rate:  93 PR Interval:    QRS Duration: 96 QT Interval:  294 QTC Calculation: 366 R Axis:   70 Text Interpretation:  Sinus rhythm Multiple premature complexes, vent & supraven Aberrant conduction of SV complex(es) Short PR interval Borderline repolarization abnormality Borderline ST elevation, anterior leads No significant change since last tracing Confirmed by Maryan Rued  MD, WHITNEY ([id number]) on 12/09/2015 7:52:18 AM       Radiology Dg Ribs Unilateral W/chest Left  Result Date: 12/09/2015 CLINICAL DATA:  Fall  last night, hit a stool with left side of chest. Mid to upper left side chest pain. EXAM: LEFT RIBS AND CHEST - 3+ VIEW COMPARISON:  06/08/2015 FINDINGS: There is a fracture through the lateral left eleventh rib. No additional rib fracture noted. Heart is normal size. Lungs are clear. No effusions or pneumothorax. IMPRESSION: Lateral left eleventh rib fracture. No acute cardiopulmonary disease. Electronically Signed   By: Rolm Baptise M.D.   On: 12/09/2015 08:19  Procedures Procedures (including critical care time)  Medications Ordered in ED Medications  HYDROcodone-acetaminophen (NORCO/VICODIN) 5-325 MG per tablet 2 tablet (not administered)  cyclobenzaprine (FLEXERIL) tablet 5 mg (not administered)  ketorolac (TORADOL) 15 MG/ML injection 15 mg (15 mg Intravenous Given 12/09/15 0837)    Initial Impression / Assessment and Plan / ED Course  I have reviewed the triage vital signs and the nursing notes.  Pertinent labs & imaging results that were available during my care of the patient were reviewed by me and considered in my medical decision making (see chart for details).  Clinical Course    Patient presenting with reported episode of unwitnessed syncope and chest wall pain after falling onto a step stool. Patient with 27 visits to ED over past 6 months, primarily for abdominal pain, most recently three days ago. Abnormal EKG though unchanged from patient's prior EKGs. iStat trop neg. CXR with lateral L eleventh rib fracture. Pain improved with Toradol x1; patient also given Norco 10-325mg  and Flexeril x1 prior to discharge. Discharged with #10 Norco 5-325mg  q6 PRN and #30 Flexeril 5mg  q8 PRN, and instructions to f/u with PCP within one week.   Final Clinical Impressions(s) / ED Diagnoses   Final diagnoses:  Left rib fracture, closed, initial encounter    New Prescriptions New Prescriptions   CYCLOBENZAPRINE (FLEXERIL) 5 MG TABLET    Take 1 tablet (5 mg total) by mouth 3 (three) times  daily as needed for muscle spasms.   HYDROCODONE-ACETAMINOPHEN (NORCO) 5-325 MG TABLET    Take 1 tablet by mouth every 6 (six) hours as needed for severe pain.   Adin Hector, MD, MPH PGY-2 [place] Pager [phone]    Verner Mould, MD 12/09/15 [id number]    Blanchie Dessert, MD 12/10/15 [phone]

## 2015-12-09 NOTE — ED Notes (Signed)
Rudy Suits; Transporter, transporting pt to x-ray.  

## 2015-12-09 NOTE — ED Triage Notes (Signed)
Pt in from home after syncopal episode, falling onto chair leg, resulting in L chest pain. Episode happened at 0300, pt was walking and felt heart racing, got dizzy, lost consciousness and fell. Pt has hx of [employer] Syn since 2014. L chest area swollen and warm, pt c/o 10/10 pain. EKG per EMS unremarkable, SR in 90's.

## 2015-12-09 NOTE — ED Notes (Signed)
MD at bedside. 

## 2015-12-09 NOTE — Discharge Instructions (Signed)
For your broken rib, you can take over the counter ibuprofen as needed every 4-6 hours for pain. If this is not improving your pain, you can take one tablet of Vicodin every 6 hours as needed for severe pain. You can also take Flexeril up to three times a day for muscle pains.  Please schedule an appointment with your regular doctor within a week to make sure you are improving.

## 2015-12-10 ENCOUNTER — Ambulatory Visit: Payer: Self-pay | Admitting: Allergy & Immunology

## 2015-12-11 ENCOUNTER — Emergency Department (HOSPITAL_COMMUNITY)
Admission: EM | Admit: 2015-12-11 | Discharge: 2015-12-11 | Disposition: A | Discharge status: Home / Self Care | Payer: Self-pay | Attending: Emergency Medicine | Admitting: Emergency Medicine

## 2015-12-11 ENCOUNTER — Encounter (HOSPITAL_COMMUNITY): Payer: Self-pay

## 2015-12-11 DIAGNOSIS — F17210 Nicotine dependence, cigarettes, uncomplicated: Secondary | ICD-10-CM | POA: Insufficient documentation

## 2015-12-11 DIAGNOSIS — Y939 Activity, unspecified: Secondary | ICD-10-CM | POA: Insufficient documentation

## 2015-12-11 DIAGNOSIS — W208XXA Other cause of strike by thrown, projected or falling object, initial encounter: Secondary | ICD-10-CM | POA: Insufficient documentation

## 2015-12-11 DIAGNOSIS — S2232XA Fracture of one rib, left side, initial encounter for closed fracture: Secondary | ICD-10-CM | POA: Insufficient documentation

## 2015-12-11 DIAGNOSIS — Z79899 Other long term (current) drug therapy: Secondary | ICD-10-CM | POA: Insufficient documentation

## 2015-12-11 DIAGNOSIS — Y999 Unspecified external cause status: Secondary | ICD-10-CM | POA: Insufficient documentation

## 2015-12-11 DIAGNOSIS — R079 Chest pain, unspecified: Secondary | ICD-10-CM

## 2015-12-11 DIAGNOSIS — R0789 Other chest pain: Secondary | ICD-10-CM | POA: Insufficient documentation

## 2015-12-11 DIAGNOSIS — Y929 Unspecified place or not applicable: Secondary | ICD-10-CM | POA: Insufficient documentation

## 2015-12-11 DIAGNOSIS — I10 Essential (primary) hypertension: Secondary | ICD-10-CM | POA: Insufficient documentation

## 2015-12-11 DIAGNOSIS — J45909 Unspecified asthma, uncomplicated: Secondary | ICD-10-CM | POA: Insufficient documentation

## 2015-12-11 MED ORDER — OXYCODONE-ACETAMINOPHEN 5-325 MG PO TABS
2.0000 | ORAL_TABLET | Freq: Once | ORAL | Status: AC
  Administered 2015-12-11: 2 via ORAL
  Filled 2015-12-11: qty 2

## 2015-12-11 MED ORDER — OXYCODONE-ACETAMINOPHEN 5-325 MG PO TABS: 2.0000 | ORAL_TABLET | ORAL | 0 refills | Status: DC | PRN

## 2015-12-11 NOTE — ED Triage Notes (Signed)
Per EMS: Pt helping friend move, refrigerator fell onto chest. Seen here few days ago and dx with broken rib. Pt back today complaining of continued pain. Wishes to be seen.

## 2015-12-11 NOTE — Discharge Instructions (Signed)
Do not initial Monday appointment with your primary care physician as scheduled.  Emergency room will not continue to provide pain medication for your chest pain or rib fractures. This must be done through your primary care physician.

## 2015-12-11 NOTE — ED Provider Notes (Signed)
[facility] DEPT Provider Note   CSN: [id number] Arrival date & time: 12/11/15  1543  First Provider Contact:  None       History   Chief Complaint Chief Complaint  Patient presents with  . Chest Pain    HPI [name] is a 45 y.o. male.  He was seen and evaluated a few days ago and had a fractured 11th rib after a fall. Worsening pain after helping someone move. States he is taking the pain medicine given to him then. He had an appointment made at the [facility]. This is for Monday. He requests additional pain control as he is still painful. He is using a lidocaine patch but still hurts.  HPI  Past Medical History:  Diagnosis Date  . Alcoholism /alcohol abuse ([place])   . Anemia   . Anxiety   . Arthritis    "knees; arms; elbows" (03/26/2015)  . Asthma   . Bipolar disorder ([place])   . Chronic bronchitis ([place])   . Chronic lower back pain   . Cocaine abuse   . Depression   . Family history of adverse reaction to anesthesia    "grandmother gets confused"  . Femoral condyle fracture ([place]) 03/08/2014   left medial/notes 03/09/2014  . GERD (gastroesophageal reflux disease)   . H/O hiatal hernia   . H/O suicide attempt 10/2012  . Heart murmur    "when he was little" (03/06/2013)  . High cholesterol   . History of blood transfusion 10/2012   "when I tried to commit suicide"  . History of stomach ulcers   . Hypertension   . Marijuana abuse, continuous   . Migraine    "a few times/year" (03/26/2015)  . Pancreatitis   . Pneumonia 1990's X 3  . PTSD (post-traumatic stress disorder)   . Shortness of breath    "can happen at anytime" (03/06/2013)  . Sickle cell trait ([place])   . WPW (Wolff-Parkinson-White syndrome)    [name] 03/06/2013    Patient Active Problem List   Diagnosis Date Noted  . Leukocytosis   . Hospital acquired PNA 05/20/2015  . Pancreatitis 05/18/2015  . Pseudocyst of pancreas 05/18/2015  . Malnutrition of moderate degree 03/27/2015  . Abnormal  transaminases 03/26/2015  . Polysubstance abuse (tobacco, cocaine, THC, and ETOH) 03/26/2015  . Acute pancreatitis 03/26/2015  . Alcohol-induced chronic pancreatitis ([place])   . Essential hypertension 02/06/2014  . Alcohol-induced acute pancreatitis 11/28/2013  . Pancreatic pseudocyst/cyst 11/25/2013  . Alcohol dependence ([place]) 10/23/2013  . MDD (major depressive disorder), recurrent severe, without psychosis ([place]) 10/22/2013  . Protein-calorie malnutrition, severe ([place]) 10/10/2013  . Suicide attempt ([place]) 10/08/2013  . Overdose 10/06/2013  . 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 TOBACCO ABUSE 03/23/2007    Past Surgical History:  Procedure Laterality Date  . CARDIAC CATHETERIZATION    . EYE SURGERY Left 1990's   "result of trauma"   . FACIAL FRACTURE SURGERY Left 1990's   "result of trauma"   . FRACTURE SURGERY    . HERNIA REPAIR    . LEFT HEART CATHETERIZATION WITH CORONARY ANGIOGRAM Right 03/07/2013   Procedure: LEFT HEART CATHETERIZATION WITH CORONARY ANGIOGRAM;  Surgeon: [name], MD;  Location: [facility];  Service: Cardiovascular;  Laterality: Right;  . UMBILICAL HERNIA REPAIR         Home Medications    Prior to Admission medications   Medication Sig Start Date End Date Taking? Authorizing Provider  albuterol (PROVENTIL HFA;VENTOLIN HFA)  108 (90 BASE) MCG/ACT inhaler Inhale 2 puffs into the lungs every 6 (six) hours as needed for wheezing or shortness of breath (wheezing).     Historical Provider, MD  cyclobenzaprine (FLEXERIL) 5 MG tablet Take 1 tablet (5 mg total) by mouth 3 (three) times daily as needed for muscle spasms. 12/09/15   Verner Mould, MD  dicyclomine (BENTYL) 20 MG tablet Take 1 tablet (20 mg total) by mouth every 6 (six) hours. 12/01/15   Margarita Mail, PA-C  diphenoxylate-atropine (LOMOTIL) 2.5-0.025 MG tablet Take 1 tablet by mouth 4 (four) times daily as needed for diarrhea or loose stools. 12/04/15    [name], PA-C  fluticasone (FLONASE) 50 MCG/ACT nasal spray Place 1 spray into both nostrils daily as needed for allergies.     Historical Provider, MD  HYDROcodone-acetaminophen (NORCO) 5-325 MG tablet Take 1 tablet by mouth every 6 (six) hours as needed for severe pain. 12/09/15   Verner Mould, MD  ibuprofen (ADVIL,MOTRIN) 200 MG tablet Take 200-800 mg by mouth every 6 (six) hours as needed for mild pain or moderate pain.     Historical Provider, MD  lipase/protease/amylase (CREON) 12000 UNITS CPEP capsule Take 2 capsules (24,000 Units total) by mouth 3 (three) times daily with meals. 01/15/15   [name], MD  meclizine (ANTIVERT) 25 MG tablet Take 1 tablet (25 mg total) by mouth 3 (three) times daily as needed for nausea. 12/01/15   Margarita Mail, PA-C  metoprolol tartrate (LOPRESSOR) 25 MG tablet Take 1 tablet (25 mg total) by mouth 2 (two) times daily. 01/15/15   [name], MD  omeprazole (PRILOSEC) 20 MG capsule Take 1 capsule (20 mg total) by mouth daily. 11/15/15   [place] Lions, PA-C  oxyCODONE-acetaminophen (PERCOCET/ROXICET) 5-325 MG tablet Take 2 tablets by mouth every 4 (four) hours as needed. 12/11/15   Tanna Furry, MD  Phenylephrine-APAP-Guaifenesin [facility] SINUS-MAX CONGESTION PO) Take 1 tablet by mouth 2 (two) times daily as needed (for congestion).    Historical Provider, MD  QUEtiapine (SEROQUEL) 50 MG tablet Take 100 mg by mouth at bedtime. 01/15/15   Historical Provider, MD  sertraline (ZOLOFT) 25 MG tablet Take 75 mg by mouth daily.    Historical Provider, MD  sildenafil (VIAGRA) 50 MG tablet Take 1 tablet (50 mg total) by mouth daily as needed for erectile dysfunction. 03/31/15   [name], MD  sucralfate (CARAFATE) 1 g tablet Take 1 tablet (1 g total) by mouth 4 (four) times daily -  with meals and at bedtime. 11/15/15    Lions, PA-C    Family History Family History  Problem Relation Age of Onset  . Hypertension  Other   . Coronary artery disease Other     Social History Social History  Substance Use Topics  . Smoking status: Current Every Day Smoker    Packs/day: 1.00    Years: 30.00    Types: Cigarettes  . Smokeless tobacco: Current User    Types: Chew  . Alcohol use 37.8 oz/week    63 Cans of beer per week     Allergies   Shellfish-derived products; Trazodone and nefazodone; and Reglan [metoclopramide]   Review of Systems Review of Systems  Constitutional: Negative for appetite change, chills, diaphoresis, fatigue and fever.  HENT: Negative for mouth sores, sore throat and trouble swallowing.   Eyes: Negative for visual disturbance.  Respiratory: Negative for cough, chest tightness, shortness of breath and wheezing.   Cardiovascular: Positive for chest  pain.  Gastrointestinal: Negative for abdominal distention, abdominal pain, diarrhea, nausea and vomiting.  Endocrine: Negative for polydipsia, polyphagia and polyuria.  Genitourinary: Negative for dysuria, frequency and hematuria.  Musculoskeletal: Negative for gait problem.  Skin: Negative for color change, pallor and rash.  Neurological: Negative for dizziness, syncope, light-headedness and headaches.  Hematological: Does not bruise/bleed easily.  Psychiatric/Behavioral: Negative for behavioral problems and confusion.     Physical Exam Updated Vital Signs BP 123/87   Pulse 88   Temp 98.2 F (36.8 C) (Oral)   Resp 16   SpO2 100%   Physical Exam  Cardiovascular:    Pain in the left anterior upper midline chest into the left lateral chest. No bony crepitus. No subcutaneous air. Symmetric breath sounds. Saturations 97%.     ED Treatments / Results  Labs (all labs ordered are listed, but only abnormal results are displayed) Labs Reviewed - No data to display  EKG  EKG Interpretation None       Radiology No results found.  Procedures Procedures (including critical care time)  Medications Ordered in  ED Medications  oxyCODONE-acetaminophen (PERCOCET/ROXICET) 5-325 MG per tablet 2 tablet (not administered)     Initial Impression / Assessment and Plan / ED Course  I have reviewed the triage vital signs and the nursing notes.  Pertinent labs & imaging results that were available during my care of the patient were reviewed by me and considered in my medical decision making (see chart for details).  Clinical Course    Afebrile. Well saturated. Pain is clearly chest wall. Recent fracture. Discussed with him that I would give him pain medication through Monday when he has an intake appointment primary care. I also informed him that the number and she would not continue to provide ongoing pain medication for this injury that this would have to be done through primary care. He expressed understanding of this.  Final Clinical Impressions(s) / ED Diagnoses   Final diagnoses:  Chest pain, unspecified chest pain type  Rib fractures, left, closed, initial encounter    New Prescriptions New Prescriptions   OXYCODONE-ACETAMINOPHEN (PERCOCET/ROXICET) 5-325 MG TABLET    Take 2 tablets by mouth every 4 (four) hours as needed.     Tanna Furry, MD 12/11/15 [phone]

## 2015-12-14 ENCOUNTER — Other Ambulatory Visit: Payer: Self-pay

## 2015-12-14 ENCOUNTER — Telehealth: Payer: Self-pay | Admitting: Internal Medicine

## 2015-12-14 ENCOUNTER — Ambulatory Visit: Payer: Self-pay | Attending: Internal Medicine | Admitting: Physician Assistant

## 2015-12-14 VITALS — BP 120/82 | HR 89 | Temp 98.2°F | Ht 69.0 in | Wt 133.6 lb

## 2015-12-14 DIAGNOSIS — Z79899 Other long term (current) drug therapy: Secondary | ICD-10-CM | POA: Insufficient documentation

## 2015-12-14 DIAGNOSIS — I10 Essential (primary) hypertension: Secondary | ICD-10-CM

## 2015-12-14 DIAGNOSIS — R05 Cough: Secondary | ICD-10-CM | POA: Insufficient documentation

## 2015-12-14 DIAGNOSIS — S2232XD Fracture of one rib, left side, subsequent encounter for fracture with routine healing: Secondary | ICD-10-CM

## 2015-12-14 DIAGNOSIS — N528 Other male erectile dysfunction: Secondary | ICD-10-CM

## 2015-12-14 DIAGNOSIS — S2232XA Fracture of one rib, left side, initial encounter for closed fracture: Secondary | ICD-10-CM | POA: Insufficient documentation

## 2015-12-14 DIAGNOSIS — X58XXXA Exposure to other specified factors, initial encounter: Secondary | ICD-10-CM | POA: Insufficient documentation

## 2015-12-14 DIAGNOSIS — R55 Syncope and collapse: Secondary | ICD-10-CM | POA: Insufficient documentation

## 2015-12-14 DIAGNOSIS — G8929 Other chronic pain: Secondary | ICD-10-CM | POA: Insufficient documentation

## 2015-12-14 DIAGNOSIS — Z791 Long term (current) use of non-steroidal anti-inflammatories (NSAID): Secondary | ICD-10-CM | POA: Insufficient documentation

## 2015-12-14 DIAGNOSIS — K2920 Alcoholic gastritis without bleeding: Secondary | ICD-10-CM | POA: Insufficient documentation

## 2015-12-14 DIAGNOSIS — F1920 Other psychoactive substance dependence, uncomplicated: Secondary | ICD-10-CM

## 2015-12-14 DIAGNOSIS — K859 Acute pancreatitis without necrosis or infection, unspecified: Secondary | ICD-10-CM | POA: Insufficient documentation

## 2015-12-14 DIAGNOSIS — I456 Pre-excitation syndrome: Secondary | ICD-10-CM | POA: Insufficient documentation

## 2015-12-14 MED ORDER — GUAIFENESIN 100 MG/5ML PO SOLN: 5.0000 mL | ORAL | 0 refills | Status: DC | PRN

## 2015-12-14 MED ORDER — SILDENAFIL CITRATE 50 MG PO TABS: 50.0000 mg | ORAL_TABLET | Freq: Every day | ORAL | 3 refills | Status: DC | PRN

## 2015-12-14 MED ORDER — BENZONATATE 200 MG PO CAPS: 200.0000 mg | ORAL_CAPSULE | Freq: Two times a day (BID) | ORAL | 0 refills | Status: DC | PRN

## 2015-12-14 MED ORDER — OXYCODONE-ACETAMINOPHEN 5-325 MG PO TABS: 2.0000 | ORAL_TABLET | ORAL | 0 refills | Status: DC | PRN

## 2015-12-14 MED ORDER — METOPROLOL TARTRATE 25 MG PO TABS: 25.0000 mg | ORAL_TABLET | Freq: Two times a day (BID) | ORAL | 3 refills | Status: DC

## 2015-12-14 MED ORDER — HYDROCODONE-ACETAMINOPHEN 5-325 MG PO TABS: 1.0000 | ORAL_TABLET | Freq: Four times a day (QID) | ORAL | 0 refills | Status: DC | PRN

## 2015-12-14 MED FILL — !VIAGRA 50 MG TABLET: 50 | 10 days supply | Qty: 2 | Fill #0

## 2015-12-14 MED FILL — METOPROLOL TARTRATE 25 MG T: 25 | 30 days supply | Qty: 60 | Fill #0

## 2015-12-14 NOTE — Telephone Encounter (Signed)
Medication Refill:  metoprolol tartrate (LOPRESSOR) 25 MG tablet [id number]   sildenafil (VIAGRA) 50 MG tablet [id number]    Pt would like to use [facility] pharmacy

## 2015-12-14 NOTE — Progress Notes (Signed)
Chief Complaint: Emergency dept follow up  Subjective: This is a 57 [place] who was last seen here approximately one year ago. Has had multiple emergency department visits from January to July 2017 chronic abdominal pain. 27 to be exact. Usually this is related to alcoholic gastritis/pancreatitis.  On 12/09/2015 he presented to the hospital after a syncopal episode. This happened at 3 AM. He states he was ambulating to the bathroom and passed out. He struck the left side of his chest. He thinks he was only out for roughly 5 minutes. He went back to bed. The latest morning he rubbed himself down and icy hot but had persistent pain leading him to the emergency department. He was found to have a left rib fracture. He was treated with Toradol. He was given a prescription for Flexeril and Norco. He was told to come here for further evaluation. He has a history of WPW diagnosed 2-3 years ago. He is not on any chronic cardiovascular medications. He has lost his insurance and the last cardiac follow up. He is in the process of obtaining the [employer].  He is sore in his chest especially with coughing or taking a deep breath.   ROS:  GEN: denies fever or chills, denies change in weight Skin: denies lesions or rashes HEENT: denies headache, earache, epistaxis, sore throat, or neck pain LUNGS: denies SHOB, dyspnea, PND, orthopnea CV: denies CP or palpitations ABD: denies abd pain, N or V EXT: denies muscle spasms or swelling; no pain in lower ext, no weakness NEURO: denies numbness or tingling, denies sz, stroke or TIA   Objective:  Vitals:   12/14/15 1200  BP: 120/82  Pulse: 89  Temp: 98.2 F (36.8 C)  TempSrc: Oral  SpO2: 100%  Weight: 133 lb 9.6 oz (60.6 kg)  Height: 5\' 9"  (1.753 m)    Physical Exam:  General: in no acute distress. HEENT: no pallor, no icterus, moist oral mucosa, no JVD, no lymphadenopathy Heart: Normal  s1 &s2  Regular rate and rhythm, without murmurs, rubs,  gallops. Lungs: Clear to auscultation bilaterally. Abdomen: Soft, nontender, nondistended, positive bowel sounds. Extremities: No clubbing cyanosis or edema with positive pedal pulses. Neuro: Alert, awake, oriented x3, nonfocal.    Medications: Prior to Admission medications   Medication Sig Start Date End Date Taking? Authorizing Provider  albuterol (PROVENTIL HFA;VENTOLIN HFA) 108 (90 BASE) MCG/ACT inhaler Inhale 2 puffs into the lungs every 6 (six) hours as needed for wheezing or shortness of breath (wheezing).    Yes Historical Provider, MD  cyclobenzaprine (FLEXERIL) 5 MG tablet Take 1 tablet (5 mg total) by mouth 3 (three) times daily as needed for muscle spasms. 12/09/15  Yes Verner Mould, MD  dicyclomine (BENTYL) 20 MG tablet Take 1 tablet (20 mg total) by mouth every 6 (six) hours. 12/01/15  Yes Margarita Mail, PA-C  diphenoxylate-atropine (LOMOTIL) 2.5-0.025 MG tablet Take 1 tablet by mouth 4 (four) times daily as needed for diarrhea or loose stools. 12/04/15  Yes [name], PA-C  fluticasone (FLONASE) 50 MCG/ACT nasal spray Place 1 spray into both nostrils daily as needed for allergies.    Yes Historical Provider, MD  ibuprofen (ADVIL,MOTRIN) 200 MG tablet Take 200-800 mg by mouth every 6 (six) hours as needed for mild pain or moderate pain.    Yes Historical Provider, MD  lipase/protease/amylase (CREON) 12000 UNITS CPEP capsule Take 2 capsules (24,000 Units total) by mouth 3 (three) times daily with meals. 01/15/15  Yes [name], MD  meclizine (ANTIVERT) 25 MG tablet Take 1 tablet (25 mg total) by mouth 3 (three) times daily as needed for nausea. 12/01/15  Yes Margarita Mail, PA-C  metoprolol tartrate (LOPRESSOR) 25 MG tablet Take 1 tablet (25 mg total) by mouth 2 (two) times daily. 01/15/15  Yes [name], MD  omeprazole (PRILOSEC) 20 MG capsule Take 1 capsule (20 mg total) by mouth daily. 11/15/15  Yes [place] Lions, PA-C   oxyCODONE-acetaminophen (PERCOCET/ROXICET) 5-325 MG tablet Take 2 tablets by mouth every 4 (four) hours as needed. 12/11/15  Yes Tanna Furry, MD  QUEtiapine (SEROQUEL) 50 MG tablet Take 100 mg by mouth at bedtime. 01/15/15  Yes Historical Provider, MD  sertraline (ZOLOFT) 25 MG tablet Take 75 mg by mouth daily.   Yes Historical Provider, MD  sildenafil (VIAGRA) 50 MG tablet Take 1 tablet (50 mg total) by mouth daily as needed for erectile dysfunction. 03/31/15  Yes Olugbemiga Essie Christine, MD  sucralfate (CARAFATE) 1 g tablet Take 1 tablet (1 g total) by mouth 4 (four) times daily -  with meals and at bedtime. 11/15/15  Yes  Lions, PA-C  HYDROcodone-acetaminophen (NORCO) 5-325 MG tablet Take 1 tablet by mouth every 6 (six) hours as needed for severe pain. Patient not taking: Reported on 12/14/2015 12/09/15   Verner Mould, MD  Phenylephrine-APAP-Guaifenesin [facility] SINUS-MAX CONGESTION PO) Take 1 tablet by mouth 2 (two) times daily as needed (for congestion).    Historical Provider, MD    Assessment: 1. Syncope  2. Rib fracture  3. Hx WPW  Plan: Refill prn pain meds Tessalon and Robitussin to try and suppress cough Referral to CARDS  Follow up: 2-4 weeks Dr. Doreene Burke  The patient was given clear instructions to go to ER or return to medical center if symptoms don't improve, worsen or new problems develop. The patient verbalized understanding. The patient was told to call to get lab results if they haven't heard anything in the next week.   This note has been created with Surveyor, quantity. Any transcriptional errors are unintentional.   Zettie Pho, PA-C 12/14/2015, 12:29 PM

## 2015-12-14 NOTE — Telephone Encounter (Signed)
Writer sent viagra and metoprolol to the [facility] pharmacy as patient requested per Zettie Pho, PA-C.  Writer has called patient's phone several times- phone has a busy signal.

## 2015-12-14 NOTE — Addendum Note (Signed)
Addended by: Raina Mina E on: 12/14/2015 12:54 PM   Modules accepted: Orders

## 2015-12-14 NOTE — Addendum Note (Signed)
Addended byZettie Pho S on: 12/14/2015 01:49 PM   Modules accepted: Orders

## 2015-12-24 ENCOUNTER — Emergency Department (HOSPITAL_COMMUNITY): Payer: Self-pay

## 2015-12-24 ENCOUNTER — Emergency Department (HOSPITAL_COMMUNITY)
Admission: EM | Admit: 2015-12-24 | Discharge: 2015-12-25 | Disposition: A | Discharge status: Home / Self Care | Payer: Self-pay | Attending: Emergency Medicine | Admitting: Emergency Medicine

## 2015-12-24 ENCOUNTER — Encounter (HOSPITAL_COMMUNITY): Payer: Self-pay | Admitting: Emergency Medicine

## 2015-12-24 DIAGNOSIS — F17210 Nicotine dependence, cigarettes, uncomplicated: Secondary | ICD-10-CM | POA: Insufficient documentation

## 2015-12-24 DIAGNOSIS — Z79899 Other long term (current) drug therapy: Secondary | ICD-10-CM | POA: Insufficient documentation

## 2015-12-24 DIAGNOSIS — R0789 Other chest pain: Secondary | ICD-10-CM | POA: Insufficient documentation

## 2015-12-24 DIAGNOSIS — Z765 Malingerer [conscious simulation]: Secondary | ICD-10-CM

## 2015-12-24 DIAGNOSIS — Z7289 Other problems related to lifestyle: Secondary | ICD-10-CM | POA: Insufficient documentation

## 2015-12-24 DIAGNOSIS — I10 Essential (primary) hypertension: Secondary | ICD-10-CM | POA: Insufficient documentation

## 2015-12-24 DIAGNOSIS — J45909 Unspecified asthma, uncomplicated: Secondary | ICD-10-CM | POA: Insufficient documentation

## 2015-12-24 LAB — CBC WITH DIFFERENTIAL/PLATELET
BASOS ABS: 0 10*3/uL (ref 0.0–0.1)
BASOS PCT: 0 %
Eosinophils Absolute: 0.3 10*3/uL (ref 0.0–0.7)
Eosinophils Relative: 3 %
HCT: 36.8 % — ABNORMAL LOW (ref 39.0–52.0)
HEMOGLOBIN: 12.1 g/dL — AB (ref 13.0–17.0)
Lymphocytes Relative: 32 %
Lymphs Abs: 2.6 10*3/uL (ref 0.7–4.0)
MCH: 30.9 pg (ref 26.0–34.0)
MCHC: 32.9 g/dL (ref 30.0–36.0)
MCV: 93.9 fL (ref 78.0–100.0)
MONOS PCT: 7 %
Monocytes Absolute: 0.6 10*3/uL (ref 0.1–1.0)
NEUTROS ABS: 4.8 10*3/uL (ref 1.7–7.7)
NEUTROS PCT: 58 %
PLATELETS: 312 10*3/uL (ref 150–400)
RBC: 3.92 MIL/uL — ABNORMAL LOW (ref 4.22–5.81)
RDW: 14 % (ref 11.5–15.5)
WBC: 8.3 10*3/uL (ref 4.0–10.5)

## 2015-12-24 LAB — COMPREHENSIVE METABOLIC PANEL
ALT: 31 U/L (ref 17–63)
AST: 33 U/L (ref 15–41)
Albumin: 3.8 g/dL (ref 3.5–5.0)
Alkaline Phosphatase: 83 U/L (ref 38–126)
Anion gap: 9 (ref 5–15)
BUN: <5 mg/dL — ABNORMAL LOW (ref 6–20)
CHLORIDE: 106 mmol/L (ref 101–111)
CO2: 24 mmol/L (ref 22–32)
CREATININE: 0.99 mg/dL (ref 0.61–1.24)
Calcium: 9.1 mg/dL (ref 8.9–10.3)
GFR calc non Af Amer: >60 mL/min (ref >60)
Glucose, Bld: 90 mg/dL (ref 65–99)
Potassium: 4.1 mmol/L (ref 3.5–5.1)
SODIUM: 139 mmol/L (ref 135–145)
Total Bilirubin: 0.3 mg/dL (ref 0.3–1.2)
Total Protein: 7.2 g/dL (ref 6.5–8.1)

## 2015-12-24 LAB — I-STAT TROPONIN, ED: TROPONIN I, POC: 0 ng/mL (ref 0.00–0.08)

## 2015-12-24 NOTE — ED Triage Notes (Signed)
Pt was dx with a rib fracture on 8/2.  Now c/o continued pain and pain in his left chest radiating down left arm.  Also c/o productive cough (yellow)

## 2015-12-24 NOTE — ED Provider Notes (Signed)
[name] Provider Note   CSN: [id number] Arrival date & time: 12/24/15  1654  By signing my name below, I, Reola Mosher, attest that this documentation has been prepared under the direction and in the presence of Cotton Beckley, MD. Electronically Signed: Reola Mosher, ED Scribe. 12/25/15. 12:43 AM.  History   Chief Complaint Chief Complaint  Patient presents with  . Chest Pain   The history is provided by the patient. No language interpreter was used.  Chest Pain   This is a recurrent problem. The current episode started 12 to 24 hours ago. The problem occurs constantly. The problem has not changed since onset.The pain is associated with coughing. The pain is at a severity of 8/10. The pain is moderate. The quality of the pain is described as stabbing. The pain radiates to the left arm. Associated symptoms include cough and sputum production. Pertinent negatives include no lower extremity edema. Treatments tried: Oxycodone and Flexeril. The treatment provided moderate relief. Risk factors include alcohol intake, male gender, substance abuse and smoking/tobacco exposure.  His past medical history is significant for hyperlipidemia.  Pertinent negatives for past medical history include no MI and no PE.  Procedure history is positive for cardiac catheterization.   HPI Comments: [name] is a 45 y.o. male with a PMHx of EtOH abuse, anemia, concaine abuse, tobacco abuse, GERD, WPW syndrome, sickle cell trait, HLD, pancreatitis, asthma, and anxiety, who presents to the Emergency Department complaining of sudden onset, unchanged, constant, 8/10, stabbing left-sided chest pain onset yesterday AM PTA. Pt states that he was laying in his bed yesterday when he turned over and heard a sudden "pop" and his pain has persisted since. Pt additionally was seen in the ED on 12/09/15 for same, and at that time was dx w/ a left rib fracture s/p ground-level fall over a step stool,  and has sustained chest pain to the area since. Pt states that his pain now radiates down into his left arm and hand. Pt reports associated productive cough with yellow/brown sputum, and rhinorrhea x ~2-3 days. He notes that his pain is exacerbated with coughing. He has been taking Flexeril, Oxycodone, and using Ephraim Hamburger Rub PTA with moderate relief of his pain; however he notes that he is now out of his rx'd pain medications and is requesting more. Denies leg swelling, or any other associated symptoms.    Past Medical History:  Diagnosis Date  . Alcoholism /alcohol abuse ([place])   . Anemia   . Anxiety   . Arthritis    "knees; arms; elbows" (03/26/2015)  . Asthma   . Bipolar disorder ([place])   . Chronic bronchitis ([place])   . Chronic lower back pain   . Cocaine abuse   . Depression   . Family history of adverse reaction to anesthesia    "grandmother gets confused"  . Femoral condyle fracture ([place]) 03/08/2014   left medial/notes 03/09/2014  . GERD (gastroesophageal reflux disease)   . H/O hiatal hernia   . H/O suicide attempt 10/2012  . Heart murmur    "when he was little" (03/06/2013)  . High cholesterol   . History of blood transfusion 10/2012   "when I tried to commit suicide"  . History of stomach ulcers   . Hypertension   . Marijuana abuse, continuous   . Migraine    "a few times/year" (03/26/2015)  . Pancreatitis   . Pneumonia 1990's X 3  . PTSD (post-traumatic stress disorder)   .  Shortness of breath    "can happen at anytime" (03/06/2013)  . Sickle cell trait ([place])   . WPW (Wolff-Parkinson-White syndrome)    [name] 03/06/2013   Patient Active Problem List   Diagnosis Date Noted  . Leukocytosis   . Hospital acquired PNA 05/20/2015  . Pancreatitis 05/18/2015  . Pseudocyst of pancreas 05/18/2015  . Malnutrition of moderate degree 03/27/2015  . Abnormal transaminases 03/26/2015  . Polysubstance abuse (tobacco, cocaine, THC, and ETOH) 03/26/2015  . Acute pancreatitis  03/26/2015  . Alcohol-induced chronic pancreatitis ([place])   . Essential hypertension 02/06/2014  . Alcohol-induced acute pancreatitis 11/28/2013  . Pancreatic pseudocyst/cyst 11/25/2013  . Alcohol dependence ([place]) 10/23/2013  . 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 Protein-calorie malnutrition, severe ([place]) 10/10/2013  . Suicide attempt ([place]) 10/08/2013  . Overdose 10/06/2013  . 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 TOBACCO ABUSE 03/23/2007   Past Surgical History:  Procedure Laterality Date  . CARDIAC CATHETERIZATION    . EYE SURGERY Left 1990's   "result of trauma"   . FACIAL FRACTURE SURGERY Left 1990's   "result of trauma"   . FRACTURE SURGERY    . HERNIA REPAIR    . LEFT HEART CATHETERIZATION WITH CORONARY ANGIOGRAM Right 03/07/2013   Procedure: LEFT HEART CATHETERIZATION WITH CORONARY ANGIOGRAM;  Surgeon: [name], MD;  Location: [facility] CATH LAB;  Service: Cardiovascular;  Laterality: Right;  . UMBILICAL HERNIA REPAIR      Home Medications    Prior to Admission medications   Medication Sig Start Date End Date Taking? Authorizing Provider  albuterol (PROVENTIL HFA;VENTOLIN HFA) 108 (90 BASE) MCG/ACT inhaler Inhale 2 puffs into the lungs every 6 (six) hours as needed for wheezing or shortness of breath (wheezing).     Historical Provider, MD  benzonatate (TESSALON) 200 MG capsule Take 1 capsule (200 mg total) by mouth 2 (two) times daily as needed for cough. 12/14/15   Tiffany Daneil Dan, PA-C  cyclobenzaprine (FLEXERIL) 5 MG tablet Take 1 tablet (5 mg total) by mouth 3 (three) times daily as needed for muscle spasms. 12/09/15   Verner Mould, MD  dicyclomine (BENTYL) 20 MG tablet Take 1 tablet (20 mg total) by mouth every 6 (six) hours. 12/01/15   Margarita Mail, PA-C  diphenoxylate-atropine (LOMOTIL) 2.5-0.025 MG tablet Take 1 tablet by mouth 4 (four) times daily as needed for diarrhea or  loose stools. 12/04/15   [name], PA-C  fluticasone (FLONASE) 50 MCG/ACT nasal spray Place 1 spray into both nostrils daily as needed for allergies.     Historical Provider, MD  guaiFENesin (ROBITUSSIN) 100 MG/5ML SOLN Take 5 mLs (100 mg total) by mouth every 4 (four) hours as needed for cough or to loosen phlegm. 12/14/15   Tiffany Daneil Dan, PA-C  ibuprofen (ADVIL,MOTRIN) 200 MG tablet Take 200-800 mg by mouth every 6 (six) hours as needed for mild pain or moderate pain.     Historical Provider, MD  lipase/protease/amylase (CREON) 12000 UNITS CPEP capsule Take 2 capsules (24,000 Units total) by mouth 3 (three) times daily with meals. 01/15/15   [name], MD  meclizine (ANTIVERT) 25 MG tablet Take 1 tablet (25 mg total) by mouth 3 (three) times daily as needed for nausea. 12/01/15   Margarita Mail, PA-C  metoprolol tartrate (LOPRESSOR) 25 MG tablet Take 1 tablet (25 mg total) by mouth 2 (two) times daily. 12/14/15   Brayton Caves, PA-C  omeprazole (PRILOSEC) 20  MG capsule Take 1 capsule (20 mg total) by mouth daily. 11/15/15   [place] Lions, PA-C  oxyCODONE-acetaminophen (PERCOCET/ROXICET) 5-325 MG tablet Take 2 tablets by mouth every 4 (four) hours as needed. 12/14/15   Tiffany Daneil Dan, PA-C  Phenylephrine-APAP-Guaifenesin (MUCINEX SINUS-MAX CONGESTION PO) Take 1 tablet by mouth 2 (two) times daily as needed (for congestion).    Historical Provider, MD  QUEtiapine (SEROQUEL) 50 MG tablet Take 100 mg by mouth at bedtime. 01/15/15   Historical Provider, MD  sertraline (ZOLOFT) 25 MG tablet Take 75 mg by mouth daily.    Historical Provider, MD  sildenafil (VIAGRA) 50 MG tablet Take 1 tablet (50 mg total) by mouth daily as needed for erectile dysfunction. 12/14/15   Brayton Caves, PA-C  sucralfate (CARAFATE) 1 g tablet Take 1 tablet (1 g total) by mouth 4 (four) times daily -  with meals and at bedtime. 11/15/15   [place] Lions, PA-C   Family History Family History  Problem Relation  Age of Onset  . Hypertension Other   . Coronary artery disease Other    Social History Social History  Substance Use Topics  . Smoking status: Current Every Day Smoker    Packs/day: 1.00    Years: 30.00    Types: Cigarettes  . Smokeless tobacco: Current User    Types: Chew  . Alcohol use 57.6 oz/week    96 Cans of beer per week   Allergies   Shellfish-derived products; Trazodone and nefazodone; and Reglan [metoclopramide] Review of Systems Review of Systems  HENT: Positive for rhinorrhea.   Respiratory: Positive for cough and sputum production.   Cardiovascular: Positive for chest pain. Negative for leg swelling.  All other systems reviewed and are negative.  Physical Exam Updated Vital Signs BP 137/75   Pulse 97   Temp 98.4 F (36.9 C) (Oral)   Resp 25   Ht 5\' 9"  (1.753 m)   Wt 140 lb (63.5 kg)   SpO2 100%   BMI 20.67 kg/m   Physical Exam  Constitutional: He appears well-developed and well-nourished.  HENT:  Head: Normocephalic.  Mouth/Throat: Oropharynx is clear and moist. No oropharyngeal exudate.  Eyes: Conjunctivae and EOM are normal. Pupils are equal, round, and reactive to light. Right eye exhibits no discharge. Left eye exhibits no discharge. No scleral icterus.  Neck: Normal range of motion. Neck supple. No JVD present. No tracheal deviation present.  Trachea is midline.  Cardiovascular: Normal rate, regular rhythm, normal heart sounds and intact distal pulses.   No murmur heard. Pulmonary/Chest: Effort normal and breath sounds normal. No stridor. No respiratory distress. He has no wheezes. He has no rales.  Lungs CTA bilaterally.   Abdominal: Soft. Bowel sounds are normal. He exhibits no distension and no mass. There is no tenderness. There is no rebound and no guarding. No hernia.  Musculoskeletal:  No lower extremity edema, all compartments are soft.   Lymphadenopathy:    He has no cervical adenopathy.  Neurological: He is alert. He has normal  reflexes.  Skin: Skin is warm.  Psychiatric: He has a normal mood and affect. His behavior is normal.  Nursing note and vitals reviewed.  ED Treatments / Results   Vitals:   12/24/15 2016 12/25/15 0030  BP: 119/94 137/75  Pulse: 75 97  Resp: 12 25  Temp: 98.4 F (36.9 C)    Results for orders placed or performed during the hospital encounter of 12/24/15  CBC with Differential  Result Value Ref  Range   WBC 8.3 4.0 - 10.5 K/uL   RBC 3.92 (L) 4.22 - 5.81 MIL/uL   Hemoglobin 12.1 (L) 13.0 - 17.0 g/dL   HCT 36.8 (L) 39.0 - 52.0 %   MCV 93.9 78.0 - 100.0 fL   MCH 30.9 26.0 - 34.0 pg   MCHC 32.9 30.0 - 36.0 g/dL   RDW 14.0 11.5 - 15.5 %   Platelets 312 150 - 400 K/uL   Neutrophils Relative % 58 %   Neutro Abs 4.8 1.7 - 7.7 K/uL   Lymphocytes Relative 32 %   Lymphs Abs 2.6 0.7 - 4.0 K/uL   Monocytes Relative 7 %   Monocytes Absolute 0.6 0.1 - 1.0 K/uL   Eosinophils Relative 3 %   Eosinophils Absolute 0.3 0.0 - 0.7 K/uL   Basophils Relative 0 %   Basophils Absolute 0.0 0.0 - 0.1 K/uL  Comprehensive metabolic panel  Result Value Ref Range   Sodium 139 135 - 145 mmol/L   Potassium 4.1 3.5 - 5.1 mmol/L   Chloride 106 101 - 111 mmol/L   CO2 24 22 - 32 mmol/L   Glucose, Bld 90 65 - 99 mg/dL   BUN <5 (L) 6 - 20 mg/dL   Creatinine, Ser 0.99 0.61 - 1.24 mg/dL   Calcium 9.1 8.9 - 10.3 mg/dL   Total Protein 7.2 6.5 - 8.1 g/dL   Albumin 3.8 3.5 - 5.0 g/dL   AST 33 15 - 41 U/L   ALT 31 17 - 63 U/L   Alkaline Phosphatase 83 38 - 126 U/L   Total Bilirubin 0.3 0.3 - 1.2 mg/dL   GFR calc non Af Amer >60 >60 mL/min   GFR calc Af Amer >60 >60 mL/min   Anion gap 9 5 - 15  I-Stat Troponin, ED (not at [facility])  Result Value Ref Range   Troponin i, poc 0.00 0.00 - 0.08 ng/mL   Comment 3           Dg Chest 2 View  Result Date: 12/24/2015 CLINICAL DATA:  Initial evaluation for acute chest pain with cough. EXAM: CHEST  2 VIEW COMPARISON:  Prior radiograph from 12/09/2015. FINDINGS: The  cardiac and mediastinal silhouettes are stable in size and contour, and remain within normal limits. The lungs are normally inflated. No airspace consolidation, pleural effusion, or pulmonary edema is identified. There is no pneumothorax. Minimal atelectasis at the left lung base. No acute osseous abnormality identified. IMPRESSION: Mild left basilar atelectasis. No other active cardiopulmonary disease. Electronically Signed   By: Jeannine Boga M.D.   On: 12/24/2015 18:54   Dg Ribs Unilateral W/chest Left  Result Date: 12/09/2015 CLINICAL DATA:  Fall last night, hit a stool with left side of chest. Mid to upper left side chest pain. EXAM: LEFT RIBS AND CHEST - 3+ VIEW COMPARISON:  06/08/2015 FINDINGS: There is a fracture through the lateral left eleventh rib. No additional rib fracture noted. Heart is normal size. Lungs are clear. No effusions or pneumothorax. IMPRESSION: Lateral left eleventh rib fracture. No acute cardiopulmonary disease. Electronically Signed   By: Rolm Baptise M.D.   On: 12/09/2015 08:19    EKG Interpretation  Date/Time:  Thursday December 24 2015 17:15:11 EDT Ventricular Rate:  70 PR Interval:  102 QRS Duration: 70 QT Interval:  350 QTC Calculation: 378 R Axis:   70 Text Interpretation:  Sinus rhythm borderline repol abnormalities  Confirmed by [facility]  MD, James Senn ([id number]) on 12/25/2015 12:01:19 AM  Procedures Procedures (including critical care time)  Medications Ordered in ED Medications  ketorolac (TORADOL) injection 60 mg (not administered)  lidocaine (LIDODERM) 5 % 1 patch (not administered)    Initial Impression / Assessment and Plan / ED Course  I have reviewed the triage vital signs and the nursing notes.  Pertinent labs & imaging results that were available during my care of the patient were reviewed by me and considered in my medical decision making (see chart for details).  Clinical Course   [facility] Controlled Substance Database  consulted: Patient has 4 pain management prescriptions filled over the past month; including Oxycodone x2, Hydrocodone x1, and most recently 120 tablets of Acetaminophen-COD #3 on 12/14/15.   Final Clinical Impressions(s) / ED Diagnoses   Final diagnoses:  None   New Prescriptions New Prescriptions   No medications on file   Results for orders placed or performed during the hospital encounter of 12/24/15  CBC with Differential  Result Value Ref Range   WBC 8.3 4.0 - 10.5 K/uL   RBC 3.92 (L) 4.22 - 5.81 MIL/uL   Hemoglobin 12.1 (L) 13.0 - 17.0 g/dL   HCT 36.8 (L) 39.0 - 52.0 %   MCV 93.9 78.0 - 100.0 fL   MCH 30.9 26.0 - 34.0 pg   MCHC 32.9 30.0 - 36.0 g/dL   RDW 14.0 11.5 - 15.5 %   Platelets 312 150 - 400 K/uL   Neutrophils Relative % 58 %   Neutro Abs 4.8 1.7 - 7.7 K/uL   Lymphocytes Relative 32 %   Lymphs Abs 2.6 0.7 - 4.0 K/uL   Monocytes Relative 7 %   Monocytes Absolute 0.6 0.1 - 1.0 K/uL   Eosinophils Relative 3 %   Eosinophils Absolute 0.3 0.0 - 0.7 K/uL   Basophils Relative 0 %   Basophils Absolute 0.0 0.0 - 0.1 K/uL  Comprehensive metabolic panel  Result Value Ref Range   Sodium 139 135 - 145 mmol/L   Potassium 4.1 3.5 - 5.1 mmol/L   Chloride 106 101 - 111 mmol/L   CO2 24 22 - 32 mmol/L   Glucose, Bld 90 65 - 99 mg/dL   BUN <5 (L) 6 - 20 mg/dL   Creatinine, Ser 0.99 0.61 - 1.24 mg/dL   Calcium 9.1 8.9 - 10.3 mg/dL   Total Protein 7.2 6.5 - 8.1 g/dL   Albumin 3.8 3.5 - 5.0 g/dL   AST 33 15 - 41 U/L   ALT 31 17 - 63 U/L   Alkaline Phosphatase 83 38 - 126 U/L   Total Bilirubin 0.3 0.3 - 1.2 mg/dL   GFR calc non Af Amer >60 >60 mL/min   GFR calc Af Amer >60 >60 mL/min   Anion gap 9 5 - 15  I-Stat Troponin, ED (not at [facility])  Result Value Ref Range   Troponin i, poc 0.00 0.00 - 0.08 ng/mL   Comment 3           Dg Chest 2 View  Result Date: 12/24/2015 CLINICAL DATA:  Initial evaluation for acute chest pain with cough. EXAM: CHEST  2 VIEW COMPARISON:  Prior  radiograph from 12/09/2015. FINDINGS: The cardiac and mediastinal silhouettes are stable in size and contour, and remain within normal limits. The lungs are normally inflated. No airspace consolidation, pleural effusion, or pulmonary edema is identified. There is no pneumothorax. Minimal atelectasis at the left lung base. No acute osseous abnormality identified. IMPRESSION: Mild left basilar atelectasis. No other active cardiopulmonary disease.  Electronically Signed   By: Jeannine Boga M.D.   On: 12/24/2015 18:54   Dg Ribs Unilateral W/chest Left  Result Date: 12/09/2015 CLINICAL DATA:  Fall last night, hit a stool with left side of chest. Mid to upper left side chest pain. EXAM: LEFT RIBS AND CHEST - 3+ VIEW COMPARISON:  06/08/2015 FINDINGS: There is a fracture through the lateral left eleventh rib. No additional rib fracture noted. Heart is normal size. Lungs are clear. No effusions or pneumothorax. IMPRESSION: Lateral left eleventh rib fracture. No acute cardiopulmonary disease. Electronically Signed   By: Rolm Baptise M.D.   On: 12/09/2015 08:19    Patient was not forthcoming with me regarding his pain medications.  All labs and EKG reviewed.  Given > 12 hours of continuous pain with normal troponin ACS is excluded.  Patient is stable for discharge.  All questions answered to patient's satisfaction. Based on history and exam patient has been appropriately medically screened and emergency conditions excluded. Patient is stable for discharge at this time. Follow up with your PMDfor recheck in 2 daysand strict return precautions given.   I personally performed the services described in this documentation, which was scribed in my presence. The recorded information has been reviewed and is accurate.       Veatrice Kells, MD 12/25/15 [phone]

## 2015-12-25 ENCOUNTER — Ambulatory Visit: Payer: Self-pay

## 2015-12-25 MED ORDER — LIDOCAINE 5 % EX PTCH
1.0000 | MEDICATED_PATCH | CUTANEOUS | Status: DC
  Administered 2015-12-25: 1 via TRANSDERMAL
  Filled 2015-12-25: qty 1

## 2015-12-25 MED ORDER — KETOROLAC TROMETHAMINE 60 MG/2ML IM SOLN
60.0000 mg | Freq: Once | INTRAMUSCULAR | Status: AC
  Administered 2015-12-25: 60 mg via INTRAMUSCULAR
  Filled 2015-12-25: qty 2

## 2015-12-25 NOTE — ED Notes (Signed)
MD at bedside. 

## 2016-01-07 ENCOUNTER — Ambulatory Visit: Payer: Self-pay | Admitting: Internal Medicine

## 2016-01-08 ENCOUNTER — Encounter: Payer: Self-pay | Admitting: Internal Medicine

## 2016-01-25 ENCOUNTER — Encounter (HOSPITAL_COMMUNITY): Payer: Self-pay | Admitting: Emergency Medicine

## 2016-01-25 ENCOUNTER — Encounter (HOSPITAL_COMMUNITY): Payer: Self-pay | Admitting: Vascular Surgery

## 2016-01-25 ENCOUNTER — Emergency Department (HOSPITAL_COMMUNITY)
Admission: EM | Admit: 2016-01-25 | Discharge: 2016-01-25 | Disposition: A | Discharge status: Home / Self Care | Payer: Self-pay | Attending: Emergency Medicine | Admitting: Emergency Medicine

## 2016-01-25 DIAGNOSIS — K529 Noninfective gastroenteritis and colitis, unspecified: Secondary | ICD-10-CM | POA: Insufficient documentation

## 2016-01-25 DIAGNOSIS — F1490 Cocaine use, unspecified, uncomplicated: Secondary | ICD-10-CM | POA: Insufficient documentation

## 2016-01-25 DIAGNOSIS — F17210 Nicotine dependence, cigarettes, uncomplicated: Secondary | ICD-10-CM | POA: Insufficient documentation

## 2016-01-25 DIAGNOSIS — J45909 Unspecified asthma, uncomplicated: Secondary | ICD-10-CM | POA: Insufficient documentation

## 2016-01-25 DIAGNOSIS — I10 Essential (primary) hypertension: Secondary | ICD-10-CM | POA: Insufficient documentation

## 2016-01-25 DIAGNOSIS — R109 Unspecified abdominal pain: Secondary | ICD-10-CM | POA: Insufficient documentation

## 2016-01-25 DIAGNOSIS — F1290 Cannabis use, unspecified, uncomplicated: Secondary | ICD-10-CM | POA: Insufficient documentation

## 2016-01-25 DIAGNOSIS — F17220 Nicotine dependence, chewing tobacco, uncomplicated: Secondary | ICD-10-CM | POA: Insufficient documentation

## 2016-01-25 DIAGNOSIS — Z79899 Other long term (current) drug therapy: Secondary | ICD-10-CM | POA: Insufficient documentation

## 2016-01-25 DIAGNOSIS — G8929 Other chronic pain: Secondary | ICD-10-CM | POA: Insufficient documentation

## 2016-01-25 LAB — COMPREHENSIVE METABOLIC PANEL
ALBUMIN: 4.2 g/dL (ref 3.5–5.0)
ALT: 56 U/L (ref 17–63)
AST: 42 U/L — AB (ref 15–41)
Alkaline Phosphatase: 102 U/L (ref 38–126)
Anion gap: 10 (ref 5–15)
BILIRUBIN TOTAL: 0.4 mg/dL (ref 0.3–1.2)
BUN: 10 mg/dL (ref 6–20)
CHLORIDE: 106 mmol/L (ref 101–111)
CO2: 17 mmol/L — ABNORMAL LOW (ref 22–32)
Calcium: 9 mg/dL (ref 8.9–10.3)
Creatinine, Ser: 0.84 mg/dL (ref 0.61–1.24)
GFR calc Af Amer: >60 mL/min (ref >60)
GFR calc non Af Amer: >60 mL/min (ref >60)
GLUCOSE: 105 mg/dL — AB (ref 65–99)
POTASSIUM: 3.7 mmol/L (ref 3.5–5.1)
Sodium: 133 mmol/L — ABNORMAL LOW (ref 135–145)
Total Protein: 7.9 g/dL (ref 6.5–8.1)

## 2016-01-25 LAB — URINALYSIS, ROUTINE W REFLEX MICROSCOPIC
GLUCOSE, UA: NEGATIVE mg/dL
Hgb urine dipstick: NEGATIVE
KETONES UR: NEGATIVE mg/dL
Leukocytes, UA: NEGATIVE
Nitrite: NEGATIVE
PH: 5.5 (ref 5.0–8.0)
Protein, ur: NEGATIVE mg/dL
Specific Gravity, Urine: 1.039 — ABNORMAL HIGH (ref 1.005–1.030)

## 2016-01-25 LAB — CBC
HEMATOCRIT: 40.1 % (ref 39.0–52.0)
Hemoglobin: 13.2 g/dL (ref 13.0–17.0)
MCH: 30.5 pg (ref 26.0–34.0)
MCHC: 32.9 g/dL (ref 30.0–36.0)
MCV: 92.6 fL (ref 78.0–100.0)
Platelets: 289 10*3/uL (ref 150–400)
RBC: 4.33 MIL/uL (ref 4.22–5.81)
RDW: 14.1 % (ref 11.5–15.5)
WBC: 9.5 10*3/uL (ref 4.0–10.5)

## 2016-01-25 LAB — LIPASE, BLOOD: LIPASE: 25 U/L (ref 11–51)

## 2016-01-25 MED ORDER — KETOROLAC TROMETHAMINE 30 MG/ML IJ SOLN
30.0000 mg | Freq: Once | INTRAMUSCULAR | Status: AC
  Administered 2016-01-25: 30 mg via INTRAVENOUS
  Filled 2016-01-25: qty 1

## 2016-01-25 MED ORDER — GI COCKTAIL ~~LOC~~
30.0000 mL | Freq: Once | ORAL | Status: AC
  Administered 2016-01-25: 30 mL via ORAL
  Filled 2016-01-25: qty 30

## 2016-01-25 MED ORDER — METRONIDAZOLE 500 MG PO TABS: 500.0000 mg | ORAL_TABLET | Freq: Two times a day (BID) | ORAL | 0 refills | Status: DC

## 2016-01-25 MED ORDER — CIPROFLOXACIN HCL 500 MG PO TABS: 500.0000 mg | ORAL_TABLET | Freq: Two times a day (BID) | ORAL | 0 refills | Status: DC

## 2016-01-25 MED ORDER — ACETAMINOPHEN 325 MG PO TABS
650.0000 mg | ORAL_TABLET | Freq: Once | ORAL | Status: AC
  Administered 2016-01-25: 650 mg via ORAL
  Filled 2016-01-25: qty 2

## 2016-01-25 MED ORDER — ONDANSETRON 8 MG PO TBDP
8.0000 mg | ORAL_TABLET | Freq: Three times a day (TID) | ORAL | 0 refills | Status: DC | PRN
Start: 2016-01-25 — End: 2016-07-19

## 2016-01-25 MED ORDER — SODIUM CHLORIDE 0.9 % IV BOLUS (SEPSIS)
1000.0000 mL | Freq: Once | INTRAVENOUS | Status: AC
  Administered 2016-01-25: 1000 mL via INTRAVENOUS

## 2016-01-25 MED ORDER — CIPROFLOXACIN IN D5W 400 MG/200ML IV SOLN
400.0000 mg | Freq: Once | INTRAVENOUS | Status: AC
  Administered 2016-01-25: 400 mg via INTRAVENOUS
  Filled 2016-01-25: qty 200

## 2016-01-25 MED ORDER — METRONIDAZOLE 500 MG PO TABS
500.0000 mg | ORAL_TABLET | Freq: Once | ORAL | Status: AC
  Administered 2016-01-25: 500 mg via ORAL
  Filled 2016-01-25: qty 1

## 2016-01-25 MED ORDER — ONDANSETRON 8 MG PO TBDP
8.0000 mg | ORAL_TABLET | Freq: Once | ORAL | Status: AC
  Administered 2016-01-25: 8 mg via ORAL
  Filled 2016-01-25: qty 1

## 2016-01-25 NOTE — ED Provider Notes (Signed)
[facility] DEPT Provider Note   CSN: [id number] Arrival date & time: 01/25/16  1250     History   Chief Complaint Chief Complaint  Patient presents with  . Abdominal Pain    HPI [name] is a 45 y.o. male.  The patient is a 45 year old male, he is well known to the emergency department for his history of persistent abdominal pain related to chronic pancreatitis which is related to his heavy alcohol use. He states his last taste of alcohol was 48 hours ago prior to the onset of abdominal pain however he is also noted multiple episodes of watery stools, 12 yesterday, ate today, no blood. He denies fevers or chills, he has not had any nausea or vomiting, he states that the pain is located in the left lower quadrant. This is slightly different than his prior pain, does have some radiation to his left lower back. He was already seen earlier this morning for abdominal pain with the same symptoms, lab work was unremarkable, got better with a GI cocktail and went home.    Abdominal Pain      Past Medical History:  Diagnosis Date  . Alcoholism /alcohol abuse ([place])   . Anemia   . Anxiety   . Arthritis    "knees; arms; elbows" (03/26/2015)  . Asthma   . Bipolar disorder ([place])   . Chronic bronchitis ([place])   . Chronic lower back pain   . Cocaine abuse   . Depression   . Family history of adverse reaction to anesthesia    "grandmother gets confused"  . Femoral condyle fracture ([place]) 03/08/2014   left medial/notes 03/09/2014  . GERD (gastroesophageal reflux disease)   . H/O hiatal hernia   . H/O suicide attempt 10/2012  . Heart murmur    "when he was little" (03/06/2013)  . High cholesterol   . History of blood transfusion 10/2012   "when I tried to commit suicide"  . History of stomach ulcers   . Hypertension   . Marijuana abuse, continuous   . Migraine    "a few times/year" (03/26/2015)  . Pancreatitis   . Pneumonia 1990's X 3  . PTSD (post-traumatic stress  disorder)   . Shortness of breath    "can happen at anytime" (03/06/2013)  . Sickle cell trait ([place])   . WPW (Wolff-Parkinson-White syndrome)    [name] 03/06/2013    Patient Active Problem List   Diagnosis Date Noted  . Leukocytosis   . Hospital acquired PNA 05/20/2015  . Pancreatitis 05/18/2015  . Pseudocyst of pancreas 05/18/2015  . Malnutrition of moderate degree 03/27/2015  . Abnormal transaminases 03/26/2015  . Polysubstance abuse (tobacco, cocaine, THC, and ETOH) 03/26/2015  . Acute pancreatitis 03/26/2015  . Alcohol-induced chronic pancreatitis ([place])   . Essential hypertension 02/06/2014  . Alcohol-induced acute pancreatitis 11/28/2013  . Pancreatic pseudocyst/cyst 11/25/2013  . Alcohol dependence ([place]) 10/23/2013  . MDD (major depressive disorder), recurrent severe, without psychosis ([place]) 10/22/2013  . Protein-calorie malnutrition, severe ([place]) 10/10/2013  . Suicide attempt ([place]) 10/08/2013  . Overdose 10/06/2013  . 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 TOBACCO ABUSE 03/23/2007    Past Surgical History:  Procedure Laterality Date  . CARDIAC CATHETERIZATION    . EYE SURGERY Left 1990's   "result of trauma"   . FACIAL FRACTURE SURGERY Left 1990's   "result of trauma"   . FRACTURE SURGERY    . HERNIA REPAIR    . LEFT HEART CATHETERIZATION WITH  CORONARY ANGIOGRAM Right 03/07/2013   Procedure: LEFT HEART CATHETERIZATION WITH CORONARY ANGIOGRAM;  Surgeon: [name], MD;  Location: [facility] CATH LAB;  Service: Cardiovascular;  Laterality: Right;  . UMBILICAL HERNIA REPAIR         Home Medications    Prior to Admission medications   Medication Sig Start Date End Date Taking? Authorizing Provider  albuterol (PROVENTIL HFA;VENTOLIN HFA) 108 (90 BASE) MCG/ACT inhaler Inhale 2 puffs into the lungs every 6 (six) hours as needed for wheezing or shortness of breath (wheezing).    Yes Historical Provider, MD  benzonatate (TESSALON) 200 MG  capsule Take 1 capsule (200 mg total) by mouth 2 (two) times daily as needed for cough. 12/14/15  Yes Tiffany Daneil Dan, PA-C  cyclobenzaprine (FLEXERIL) 5 MG tablet Take 1 tablet (5 mg total) by mouth 3 (three) times daily as needed for muscle spasms. 12/09/15  Yes Verner Mould, MD  dicyclomine (BENTYL) 20 MG tablet Take 1 tablet (20 mg total) by mouth every 6 (six) hours. 12/01/15  Yes Margarita Mail, PA-C  diphenoxylate-atropine (LOMOTIL) 2.5-0.025 MG tablet Take 1 tablet by mouth 4 (four) times daily as needed for diarrhea or loose stools. 12/04/15  Yes [name], PA-C  fluticasone (FLONASE) 50 MCG/ACT nasal spray Place 1 spray into both nostrils daily as needed for allergies.    Yes Historical Provider, MD  lipase/protease/amylase (CREON) 12000 UNITS CPEP capsule Take 2 capsules (24,000 Units total) by mouth 3 (three) times daily with meals. 01/15/15  Yes [name], MD  metoprolol tartrate (LOPRESSOR) 25 MG tablet Take 1 tablet (25 mg total) by mouth 2 (two) times daily. 12/14/15  Yes Tiffany Daneil Dan, PA-C  omeprazole (PRILOSEC) 20 MG capsule Take 1 capsule (20 mg total) by mouth daily. 11/15/15  Yes Ursa Lions, PA-C  ondansetron (ZOFRAN ODT) 8 MG disintegrating tablet Take 1 tablet (8 mg total) by mouth every 8 (eight) hours as needed for nausea or vomiting. 01/25/16  Yes [name], MD  oxyCODONE-acetaminophen (PERCOCET/ROXICET) 5-325 MG tablet Take 2 tablets by mouth every 4 (four) hours as needed. 12/14/15  Yes Tiffany Daneil Dan, PA-C  QUEtiapine (SEROQUEL) 50 MG tablet Take 100 mg by mouth at bedtime. 01/15/15  Yes Historical Provider, MD  sertraline (ZOLOFT) 25 MG tablet Take 75 mg by mouth daily.   Yes Historical Provider, MD  sildenafil (VIAGRA) 50 MG tablet Take 1 tablet (50 mg total) by mouth daily as needed for erectile dysfunction. 12/14/15  Yes Tiffany Daneil Dan, PA-C  sucralfate (CARAFATE) 1 g tablet Take 1 tablet (1 g total) by mouth 4 (four) times daily -  with meals  and at bedtime. 11/15/15  Yes [place] Lions, PA-C  ciprofloxacin (CIPRO) 500 MG tablet Take 1 tablet (500 mg total) by mouth 2 (two) times daily. 01/25/16   Noemi Chapel, MD  metroNIDAZOLE (FLAGYL) 500 MG tablet Take 1 tablet (500 mg total) by mouth 2 (two) times daily. 01/25/16   Noemi Chapel, MD    Family History Family History  Problem Relation Age of Onset  . Hypertension Other   . Coronary artery disease Other     Social History Social History  Substance Use Topics  . Smoking status: Current Every Day Smoker    Packs/day: 1.00    Years: 30.00    Types: Cigarettes  . Smokeless tobacco: Current User    Types: Chew  . Alcohol use 57.6 oz/week    96 Cans of beer per week  Allergies   Shellfish-derived products; Trazodone and nefazodone; and Reglan [metoclopramide]   Review of Systems Review of Systems  Gastrointestinal: Positive for abdominal pain.  All other systems reviewed and are negative.    Physical Exam Updated Vital Signs BP 152/94   Pulse 92   Temp 97 F (36.1 C) (Oral)   Resp 23   SpO2 100%   Physical Exam  Constitutional: He appears well-developed and well-nourished. No distress.  HENT:  Head: Normocephalic and atraumatic.  Mouth/Throat: Oropharynx is clear and moist. No oropharyngeal exudate.  Eyes: Conjunctivae and EOM are normal. Pupils are equal, round, and reactive to light. Right eye exhibits no discharge. Left eye exhibits no discharge. No scleral icterus.  Neck: Normal range of motion. Neck supple. No JVD present. No thyromegaly present.  Cardiovascular: Normal rate, regular rhythm, normal heart sounds and intact distal pulses.  Exam reveals no gallop and no friction rub.   No murmur heard. Pulmonary/Chest: Effort normal and breath sounds normal. No respiratory distress. He has no wheezes. He has no rales.  Abdominal: Soft. He exhibits no distension and no mass. There is tenderness ( Increased bowel sounds, left upper and left lower  quadrant tenderness to palpation with no guarding or peritoneal signs, no right-sided tenderness, no epigastric tenderness).  Musculoskeletal: Normal range of motion. He exhibits no edema or tenderness.  Lymphadenopathy:    He has no cervical adenopathy.  Neurological: He is alert. Coordination normal.  Skin: Skin is warm and dry. No rash noted. No erythema.  Psychiatric: He has a normal mood and affect. His behavior is normal.  Nursing note and vitals reviewed.    ED Treatments / Results  Labs (all labs ordered are listed, but only abnormal results are displayed) Labs Reviewed  COMPREHENSIVE METABOLIC PANEL - Abnormal; Notable for the following:       Result Value   Sodium 133 (*)    CO2 17 (*)    Glucose, Bld 105 (*)    AST 42 (*)    All other components within normal limits  URINALYSIS, ROUTINE W REFLEX MICROSCOPIC (NOT AT [facility]) - Abnormal; Notable for the following:    Color, Urine AMBER (*)    Specific Gravity, Urine 1.039 (*)    Bilirubin Urine SMALL (*)    All other components within normal limits  LIPASE, BLOOD  CBC    Radiology No results found.  Procedures Procedures (including critical care time)  Medications Ordered in ED Medications  ciprofloxacin (CIPRO) IVPB 400 mg (0 mg Intravenous Stopped 01/25/16 2305)  metroNIDAZOLE (FLAGYL) tablet 500 mg (500 mg Oral Given 01/25/16 2138)  sodium chloride 0.9 % bolus 1,000 mL (0 mLs Intravenous Stopped 01/25/16 2305)  gi cocktail (Maalox,Lidocaine,Donnatal) (30 mLs Oral Given 01/25/16 2138)  ketorolac (TORADOL) 30 MG/ML injection 30 mg (30 mg Intravenous Given 01/25/16 2138)     Initial Impression / Assessment and Plan / ED Course  I have reviewed the triage vital signs and the nursing notes.  Pertinent labs & imaging results that were available during my care of the patient were reviewed by me and considered in my medical decision making (see chart for details).  Clinical Course    Initially the patient was  tachycardic to 128, his labs show that he is probably dehydrated based on a high specific gravity in his urine. Not surprisingly the rest of his blood work was normal. Given his 20 or more watery bowel movements in the last 48 hours I suggest that he has likely  a colitis and would probably benefit from some IV fluids and possibly a regimen of Cipro and Flagyl. The patient was acceptable to these terms, he endorses the idea that he does not need opiate medications, he does request a single dose of Toradol which I think is reasonable at this time. Tachycardia improving  Well appearing after fluids Stable for d/c Likely colitis  Final Clinical Impressions(s) / ED Diagnoses   Final diagnoses:  Colitis    New Prescriptions New Prescriptions   CIPROFLOXACIN (CIPRO) 500 MG TABLET    Take 1 tablet (500 mg total) by mouth 2 (two) times daily.   METRONIDAZOLE (FLAGYL) 500 MG TABLET    Take 1 tablet (500 mg total) by mouth 2 (two) times daily.     Noemi Chapel, MD 01/25/16 [id number]

## 2016-01-25 NOTE — Discharge Instructions (Signed)
Your bloodwork was unremarkable - you may go home and take tylenol or motrin as needed for pain, stay hydrated by drinking lots of fluids - preferrably gatorade or sports drinks  Cipro twice daily for 10 days Flagyl twice daily for 10 days

## 2016-01-25 NOTE — ED Triage Notes (Signed)
Patient states he has LUQ abdominal pain since yesterday.  He has pain after eating fried chicken.  He states he has blood pressure medication but hasn't been taking them.    BP:138/90 P:88 R:18

## 2016-01-25 NOTE — Care Management (Signed)
Patient has had 21 ED visits in the past 6 months none resulting in a hospital stay, ED Care Plan inplace. Patient has a hx of chronic pancreatitis with ETOH abuse. Patient does not have health insurance and is followed at the [facility] by Horton Finer NP.

## 2016-01-25 NOTE — ED Provider Notes (Signed)
[facility] DEPT Provider Note   CSN: [id number] Arrival date & time: 01/25/16  [id number]     History   Chief Complaint Chief Complaint  Patient presents with  . Abdominal Pain    HPI [name] is a 45 y.o. male. Patient presents the emergency department complaining of abdominal pain since yesterday.  He has a long-standing history of chronic pancreatitis and chronic abdominal pain.  We have seen this patient in the emergency department 20 times in the past 6 months.  He states he is scheduled to see gastroenterology in the coming weeks.  He reports that his primary care provider usually prescribes 5 mg oxycodone but is currently out.  He denies hematemesis.  Reports some diarrhea.  Reports generalized abdominal pain.  No melena or hematochezia described.  No fevers.  No urinary complaints.  No chest pain shortness breath.  Denies flank pain or back pain.   The history is provided by the patient and medical records.    Past Medical History:  Diagnosis Date  . Alcoholism /alcohol abuse ([place])   . Anemia   . Anxiety   . Arthritis    "knees; arms; elbows" (03/26/2015)  . Asthma   . Bipolar disorder ([place])   . Chronic bronchitis ([place])   . Chronic lower back pain   . Cocaine abuse   . Depression   . Family history of adverse reaction to anesthesia    "grandmother gets confused"  . Femoral condyle fracture ([place]) 03/08/2014   left medial/notes 03/09/2014  . GERD (gastroesophageal reflux disease)   . H/O hiatal hernia   . H/O suicide attempt 10/2012  . Heart murmur    "when he was little" (03/06/2013)  . High cholesterol   . History of blood transfusion 10/2012   "when I tried to commit suicide"  . History of stomach ulcers   . Hypertension   . Marijuana abuse, continuous   . Migraine    "a few times/year" (03/26/2015)  . Pancreatitis   . Pneumonia 1990's X 3  . PTSD (post-traumatic stress disorder)   . Shortness of breath    "can happen at anytime" (03/06/2013)  .  Sickle cell trait ([place])   . WPW (Wolff-Parkinson-White syndrome)    [name] 03/06/2013    Patient Active Problem List   Diagnosis Date Noted  . Leukocytosis   . Hospital acquired PNA 05/20/2015  . Pancreatitis 05/18/2015  . Pseudocyst of pancreas 05/18/2015  . Malnutrition of moderate degree 03/27/2015  . Abnormal transaminases 03/26/2015  . Polysubstance abuse (tobacco, cocaine, THC, and ETOH) 03/26/2015  . Acute pancreatitis 03/26/2015  . Alcohol-induced chronic pancreatitis ([place])   . Essential hypertension 02/06/2014  . Alcohol-induced acute pancreatitis 11/28/2013  . Pancreatic pseudocyst/cyst 11/25/2013  . Alcohol dependence ([place]) 10/23/2013  . MDD (major depressive disorder), recurrent severe, without psychosis ([place]) 10/22/2013  . Protein-calorie malnutrition, severe ([place]) 10/10/2013  . Suicide attempt ([place]) 10/08/2013  . Overdose 10/06/2013  . 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 TOBACCO ABUSE 03/23/2007    Past Surgical History:  Procedure Laterality Date  . CARDIAC CATHETERIZATION    . EYE SURGERY Left 1990's   "result of trauma"   . FACIAL FRACTURE SURGERY Left 1990's   "result of trauma"   . FRACTURE SURGERY    . HERNIA REPAIR    . LEFT HEART CATHETERIZATION WITH CORONARY ANGIOGRAM Right 03/07/2013   Procedure: LEFT HEART CATHETERIZATION WITH CORONARY ANGIOGRAM;  Surgeon: [name], MD;  Location: [facility]  CATH LAB;  Service: Cardiovascular;  Laterality: Right;  . UMBILICAL HERNIA REPAIR         Home Medications    Prior to Admission medications   Medication Sig Start Date End Date Taking? Authorizing Provider  albuterol (PROVENTIL HFA;VENTOLIN HFA) 108 (90 BASE) MCG/ACT inhaler Inhale 2 puffs into the lungs every 6 (six) hours as needed for wheezing or shortness of breath (wheezing).     Historical Provider, MD  benzonatate (TESSALON) 200 MG capsule Take 1 capsule (200 mg total) by mouth 2 (two) times daily as needed for  cough. 12/14/15   Tiffany Daneil Dan, PA-C  cyclobenzaprine (FLEXERIL) 5 MG tablet Take 1 tablet (5 mg total) by mouth 3 (three) times daily as needed for muscle spasms. 12/09/15   Verner Mould, MD  dicyclomine (BENTYL) 20 MG tablet Take 1 tablet (20 mg total) by mouth every 6 (six) hours. 12/01/15   Margarita Mail, PA-C  diphenoxylate-atropine (LOMOTIL) 2.5-0.025 MG tablet Take 1 tablet by mouth 4 (four) times daily as needed for diarrhea or loose stools. 12/04/15   [name], PA-C  fluticasone (FLONASE) 50 MCG/ACT nasal spray Place 1 spray into both nostrils daily as needed for allergies.     Historical Provider, MD  guaiFENesin (ROBITUSSIN) 100 MG/5ML SOLN Take 5 mLs (100 mg total) by mouth every 4 (four) hours as needed for cough or to loosen phlegm. 12/14/15   Tiffany Daneil Dan, PA-C  ibuprofen (ADVIL,MOTRIN) 200 MG tablet Take 200-800 mg by mouth every 6 (six) hours as needed for mild pain or moderate pain.     Historical Provider, MD  lipase/protease/amylase (CREON) 12000 UNITS CPEP capsule Take 2 capsules (24,000 Units total) by mouth 3 (three) times daily with meals. 01/15/15   [name], MD  meclizine (ANTIVERT) 25 MG tablet Take 1 tablet (25 mg total) by mouth 3 (three) times daily as needed for nausea. 12/01/15   Margarita Mail, PA-C  metoprolol tartrate (LOPRESSOR) 25 MG tablet Take 1 tablet (25 mg total) by mouth 2 (two) times daily. 12/14/15   Tiffany Daneil Dan, PA-C  omeprazole (PRILOSEC) 20 MG capsule Take 1 capsule (20 mg total) by mouth daily. 11/15/15   [place] Lions, PA-C  oxyCODONE-acetaminophen (PERCOCET/ROXICET) 5-325 MG tablet Take 2 tablets by mouth every 4 (four) hours as needed. 12/14/15   Tiffany Daneil Dan, PA-C  Phenylephrine-APAP-Guaifenesin (MUCINEX SINUS-MAX CONGESTION PO) Take 1 tablet by mouth 2 (two) times daily as needed (for congestion).    Historical Provider, MD  QUEtiapine (SEROQUEL) 50 MG tablet Take 100 mg by mouth at bedtime. 01/15/15   Historical  Provider, MD  sertraline (ZOLOFT) 25 MG tablet Take 75 mg by mouth daily.    Historical Provider, MD  sildenafil (VIAGRA) 50 MG tablet Take 1 tablet (50 mg total) by mouth daily as needed for erectile dysfunction. 12/14/15   Brayton Caves, PA-C  sucralfate (CARAFATE) 1 g tablet Take 1 tablet (1 g total) by mouth 4 (four) times daily -  with meals and at bedtime. 11/15/15   [place] Lions, PA-C    Family History Family History  Problem Relation Age of Onset  . Hypertension Other   . Coronary artery disease Other     Social History Social History  Substance Use Topics  . Smoking status: Current Every Day Smoker    Packs/day: 1.00    Years: 30.00    Types: Cigarettes  . Smokeless tobacco: Current User    Types: Chew  . Alcohol  use 57.6 oz/week    96 Cans of beer per week     Allergies   Shellfish-derived products; Trazodone and nefazodone; and Reglan [metoclopramide]   Review of Systems Review of Systems  All other systems reviewed and are negative.    Physical Exam Updated Vital Signs There were no vitals taken for this visit.  Physical Exam  Constitutional: He is oriented to person, place, and time. He appears well-developed and well-nourished.  HENT:  Head: Normocephalic and atraumatic.  Eyes: EOM are normal.  Neck: Normal range of motion.  Cardiovascular: Normal rate, regular rhythm and normal heart sounds.   Pulmonary/Chest: Effort normal and breath sounds normal. No respiratory distress.  Abdominal: Soft. He exhibits no distension. There is no tenderness.  Musculoskeletal: Normal range of motion.  Neurological: He is alert and oriented to person, place, and time.  Skin: Skin is warm and dry.  Psychiatric: He has a normal mood and affect. Judgment normal.  Nursing note and vitals reviewed.    ED Treatments / Results  Labs (all labs ordered are listed, but only abnormal results are displayed) Labs Reviewed - No data to display  EKG  EKG  Interpretation None       Radiology No results found.  Procedures Procedures (including critical care time)  Medications Ordered in ED Medications - No data to display   Initial Impression / Assessment and Plan / ED Course  I have reviewed the triage vital signs and the nursing notes.  Pertinent labs & imaging results that were available during my care of the patient were reviewed by me and considered in my medical decision making (see chart for details).  Clinical Course    Overall lipase is well-appearing.  Vital signs are normal.  I've seen this patient multiple times the emergency department his complaints are similar to his chronic complaints.  I do not think there is an acute medical illness at this time.  He is not distended.  Doubt bowel obstruction.  Patient tolerated the GI cocktail and feels better at this time.  Discharge home with Zofran and instructions to follow-up with his nurse practitioner.  Final Clinical Impressions(s) / ED Diagnoses   Final diagnoses:  Chronic abdominal pain    New Prescriptions New Prescriptions   No medications on file     [name], MD 01/25/16 1038

## 2016-01-25 NOTE — ED Notes (Signed)
Bed: [id number] Expected date:  Expected time:  Means of arrival:  Comments: 45 yo M, abd pain

## 2016-01-25 NOTE — ED Triage Notes (Signed)
Pt reports to the ED for eval of abd pain. Pt has hx of pancreatitis. Was seen earlier today for the same and was d/c but came back because he still has pain. Pt also reports some N/V/D. Symptoms began at 4 am this morning. Pt has active care plan.

## 2016-01-29 IMAGING — CR DG CHEST 2V
2 series · 2 of 2 positions shown · non-contrast
Comparison: 03/06/2013

CLINICAL DATA: Chest pain, cough

EXAM:
CHEST  2 VIEW

[w chest pa]
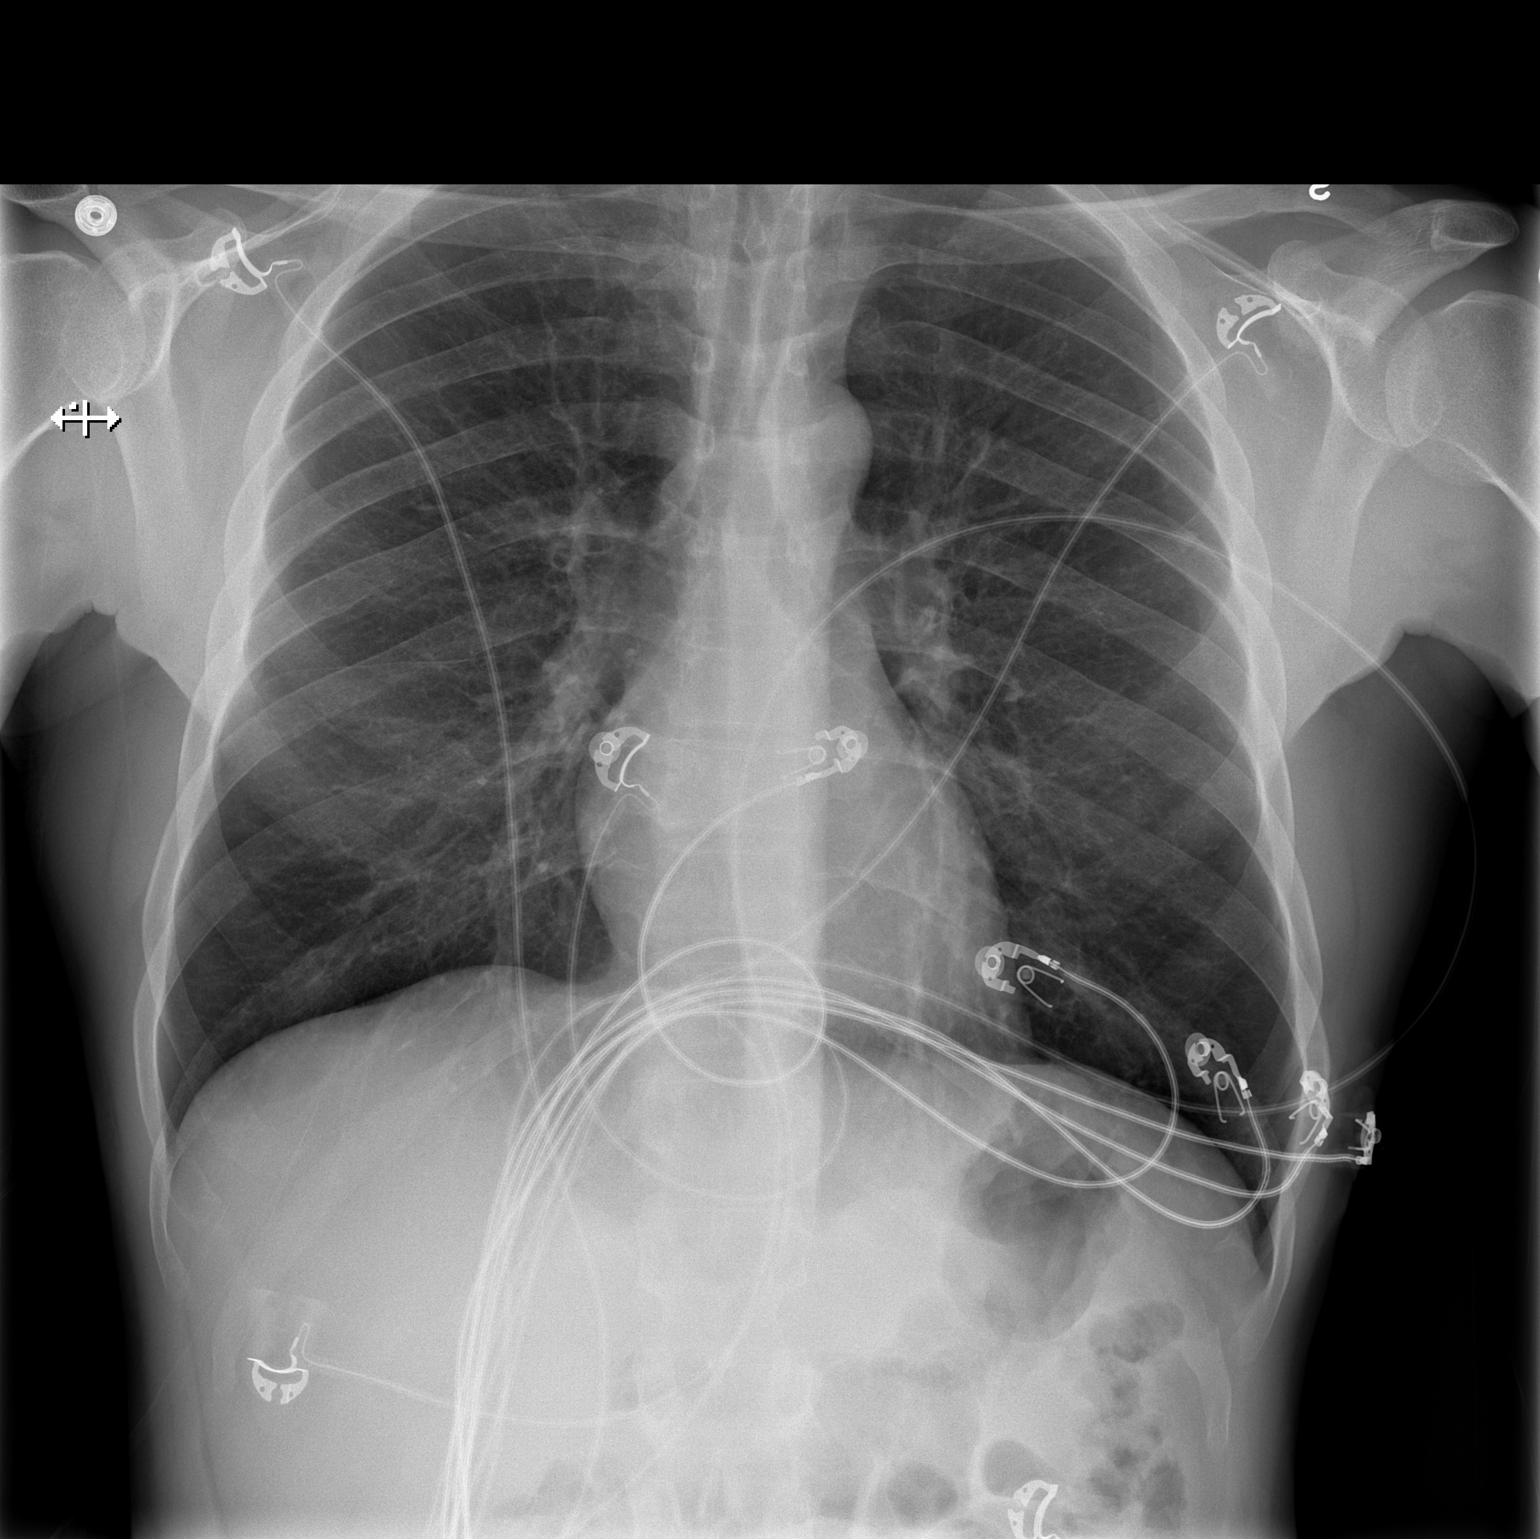

[w chest lat]
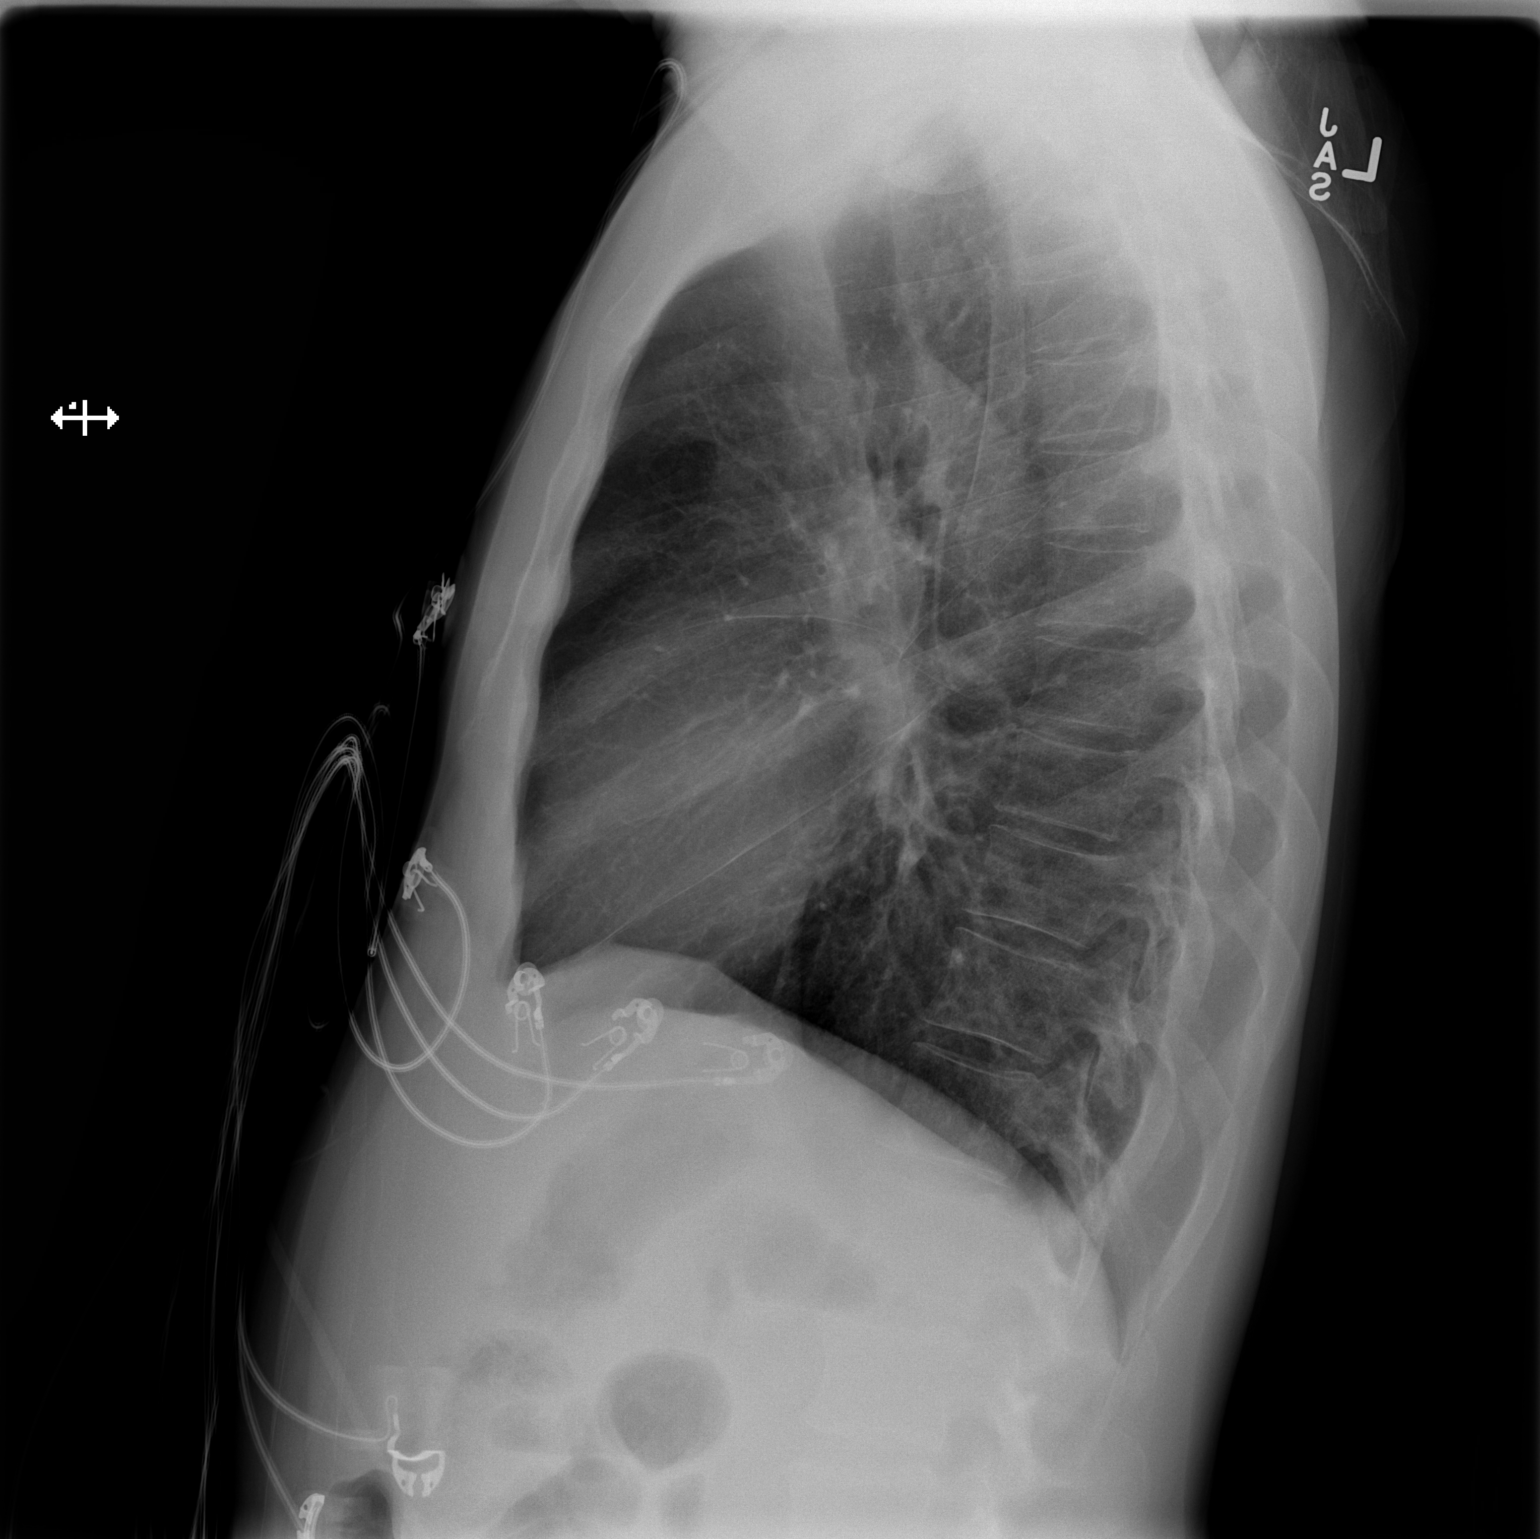

[2 of 2 positions shown; findings below may reference images not displayed]

FINDINGS: Cardiomediastinal silhouette is stable. No acute infiltrate or
pleural effusion. No pulmonary edema. Bony thorax is unremarkable.
IMPRESSION: No active cardiopulmonary disease.

## 2016-02-06 ENCOUNTER — Encounter (HOSPITAL_COMMUNITY): Payer: Self-pay | Admitting: *Deleted

## 2016-02-06 ENCOUNTER — Emergency Department (HOSPITAL_COMMUNITY)
Admission: EM | Admit: 2016-02-06 | Discharge: 2016-02-06 | Disposition: A | Discharge status: Home / Self Care | Payer: Self-pay | Attending: Emergency Medicine | Admitting: Emergency Medicine

## 2016-02-06 ENCOUNTER — Emergency Department (HOSPITAL_COMMUNITY): Payer: Self-pay

## 2016-02-06 DIAGNOSIS — R1013 Epigastric pain: Secondary | ICD-10-CM | POA: Insufficient documentation

## 2016-02-06 DIAGNOSIS — R109 Unspecified abdominal pain: Secondary | ICD-10-CM

## 2016-02-06 DIAGNOSIS — R079 Chest pain, unspecified: Secondary | ICD-10-CM | POA: Insufficient documentation

## 2016-02-06 DIAGNOSIS — I10 Essential (primary) hypertension: Secondary | ICD-10-CM | POA: Insufficient documentation

## 2016-02-06 DIAGNOSIS — G8929 Other chronic pain: Secondary | ICD-10-CM

## 2016-02-06 DIAGNOSIS — R1032 Left lower quadrant pain: Secondary | ICD-10-CM | POA: Insufficient documentation

## 2016-02-06 LAB — COMPREHENSIVE METABOLIC PANEL
ALT: 63 U/L (ref 17–63)
ANION GAP: 7 (ref 5–15)
AST: 51 U/L — ABNORMAL HIGH (ref 15–41)
Albumin: 3.7 g/dL (ref 3.5–5.0)
Alkaline Phosphatase: 91 U/L (ref 38–126)
BUN: <5 mg/dL — ABNORMAL LOW (ref 6–20)
CHLORIDE: 107 mmol/L (ref 101–111)
CO2: 26 mmol/L (ref 22–32)
Calcium: 8.9 mg/dL (ref 8.9–10.3)
Creatinine, Ser: 0.57 mg/dL — ABNORMAL LOW (ref 0.61–1.24)
GFR calc non Af Amer: >60 mL/min (ref >60)
Glucose, Bld: 88 mg/dL (ref 65–99)
Potassium: 4.3 mmol/L (ref 3.5–5.1)
SODIUM: 140 mmol/L (ref 135–145)
Total Bilirubin: 0.5 mg/dL (ref 0.3–1.2)
Total Protein: 6.6 g/dL (ref 6.5–8.1)

## 2016-02-06 LAB — URINALYSIS, ROUTINE W REFLEX MICROSCOPIC
Bilirubin Urine: NEGATIVE
GLUCOSE, UA: NEGATIVE mg/dL
HGB URINE DIPSTICK: NEGATIVE
Ketones, ur: NEGATIVE mg/dL
Leukocytes, UA: NEGATIVE
Nitrite: NEGATIVE
Protein, ur: NEGATIVE mg/dL
SPECIFIC GRAVITY, URINE: 1.011 (ref 1.005–1.030)
pH: 6.5 (ref 5.0–8.0)

## 2016-02-06 LAB — CBC
HEMATOCRIT: 37.8 % — AB (ref 39.0–52.0)
HEMOGLOBIN: 12.2 g/dL — AB (ref 13.0–17.0)
MCH: 30.5 pg (ref 26.0–34.0)
MCHC: 32.3 g/dL (ref 30.0–36.0)
MCV: 94.5 fL (ref 78.0–100.0)
Platelets: 257 10*3/uL (ref 150–400)
RBC: 4 MIL/uL — AB (ref 4.22–5.81)
RDW: 14.9 % (ref 11.5–15.5)
WBC: 7.6 10*3/uL (ref 4.0–10.5)

## 2016-02-06 LAB — I-STAT TROPONIN, ED: TROPONIN I, POC: 0 ng/mL (ref 0.00–0.08)

## 2016-02-06 LAB — LIPASE, BLOOD: LIPASE: 26 U/L (ref 11–51)
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
## 2016-02-06 MED ORDER — KETOROLAC TROMETHAMINE 30 MG/ML IJ SOLN
30.0000 mg | Freq: Once | INTRAMUSCULAR | Status: AC
  Administered 2016-02-06: 30 mg via INTRAVENOUS
  Filled 2016-02-06: qty 1

## 2016-02-06 MED ORDER — LORAZEPAM 2 MG/ML IJ SOLN
0.5000 mg | Freq: Once | INTRAMUSCULAR | Status: AC
  Administered 2016-02-06: 0.5 mg via INTRAVENOUS
  Filled 2016-02-06: qty 1

## 2016-02-06 MED ORDER — GI COCKTAIL ~~LOC~~
30.0000 mL | Freq: Once | ORAL | Status: AC
  Administered 2016-02-06: 30 mL via ORAL
  Filled 2016-02-06: qty 30

## 2016-02-06 NOTE — ED Provider Notes (Signed)
[facility] DEPT Provider Note   CSN: [id number] Arrival date & time: 02/06/16  0510     History   Chief Complaint Chief Complaint  Patient presents with  . Abdominal Pain  . Chest Pain    HPI [name] is a 45 y.o. male with a past medical history of pancreatitis, chronic abdominal pain, WPW, EtOH abuse who presents to the ED today complaining of abdominal pain and chest pain. Patient states that around 9 PM last night he began feeling nauseated and had 5-6 episodes of loose and watery stools. No melena or hematochezia. Around midnight last night patient states "my pancreatitis started acting up". He began having left lower quadrant, crampy abdominal pain that does not radiate. Patient then states that around 3 AM he began having a sharp, stabbing sensation in his left chest which patient states is similar to his "WPW pain". Patient denies any vomiting, shortness of breath, loss of consciousness, paresthesias, dysuria, fevers, chills. Patient states that he had 4-5 beers yesterday. Patient states that he has an appointment on Monday with [facility] to obtain his orange card. Patient states once he does that he will be able to see a gastroenterologist to be able to further manage his pain. Patient states his primary care doctor stopped giving him oxycodone.  Of note, patient was given empiric Cipro and Flagyl for presumed colitis on 9/18. Patient states that this did improve his diarrhea and abdominal pain. Patient states that he still has 2 days left of his antibiotic therapy and he reports compliance with this medication.  HPI  Past Medical History:  Diagnosis Date  . Alcoholism /alcohol abuse ([place])   . Anemia   . Anxiety   . Arthritis    "knees; arms; elbows" (03/26/2015)  . Asthma   . Bipolar disorder ([place])   . Chronic bronchitis ([place])   . Chronic lower back pain   . Cocaine abuse   . Depression   . Family history of adverse reaction to anesthesia      "grandmother gets confused"  . Femoral condyle fracture ([place]) 03/08/2014   left medial/notes 03/09/2014  . GERD (gastroesophageal reflux disease)   . H/O hiatal hernia   . H/O suicide attempt 10/2012  . Heart murmur    "when he was little" (03/06/2013)  . High cholesterol   . History of blood transfusion 10/2012   "when I tried to commit suicide"  . History of stomach ulcers   . Hypertension   . Marijuana abuse, continuous   . Migraine    "a few times/year" (03/26/2015)  . Pancreatitis   . Pneumonia 1990's X 3  . PTSD (post-traumatic stress disorder)   . Shortness of breath    "can happen at anytime" (03/06/2013)  . Sickle cell trait ([place])   . WPW (Wolff-Parkinson-White syndrome)    [name] 03/06/2013    Patient Active Problem List   Diagnosis Date Noted  . Leukocytosis   . Hospital acquired PNA 05/20/2015  . Pancreatitis 05/18/2015  . Pseudocyst of pancreas 05/18/2015  . Malnutrition of moderate degree 03/27/2015  . Abnormal transaminases 03/26/2015  . Polysubstance abuse (tobacco, cocaine, THC, and ETOH) 03/26/2015  . Acute pancreatitis 03/26/2015  . Alcohol-induced chronic pancreatitis ([place])   . Essential hypertension 02/06/2014  . Alcohol-induced acute pancreatitis 11/28/2013  . Pancreatic pseudocyst/cyst 11/25/2013  . Alcohol dependence ([place]) 10/23/2013  . MDD (major depressive disorder), recurrent severe, without psychosis ([place]) 10/22/2013  . Protein-calorie malnutrition, severe ([place]) 10/10/2013  .  Suicide attempt ([place]) 10/08/2013  . Overdose 10/06/2013  . 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 TOBACCO ABUSE 03/23/2007    Past Surgical History:  Procedure Laterality Date  . CARDIAC CATHETERIZATION    . EYE SURGERY Left 1990's   "result of trauma"   . FACIAL FRACTURE SURGERY Left 1990's   "result of trauma"   . FRACTURE SURGERY    . HERNIA REPAIR    . LEFT HEART CATHETERIZATION WITH CORONARY ANGIOGRAM Right 03/07/2013    Procedure: LEFT HEART CATHETERIZATION WITH CORONARY ANGIOGRAM;  Surgeon: [name], MD;  Location: [facility] CATH LAB;  Service: Cardiovascular;  Laterality: Right;  . UMBILICAL HERNIA REPAIR         Home Medications    Prior to Admission medications   Medication Sig Start Date End Date Taking? Authorizing Provider  albuterol (PROVENTIL HFA;VENTOLIN HFA) 108 (90 BASE) MCG/ACT inhaler Inhale 2 puffs into the lungs every 6 (six) hours as needed for wheezing or shortness of breath (wheezing).     Historical Provider, MD  benzonatate (TESSALON) 200 MG capsule Take 1 capsule (200 mg total) by mouth 2 (two) times daily as needed for cough. 12/14/15   Tiffany Daneil Dan, PA-C  ciprofloxacin (CIPRO) 500 MG tablet Take 1 tablet (500 mg total) by mouth 2 (two) times daily. 01/25/16   Noemi Chapel, MD  cyclobenzaprine (FLEXERIL) 5 MG tablet Take 1 tablet (5 mg total) by mouth 3 (three) times daily as needed for muscle spasms. 12/09/15   Verner Mould, MD  dicyclomine (BENTYL) 20 MG tablet Take 1 tablet (20 mg total) by mouth every 6 (six) hours. 12/01/15   Margarita Mail, PA-C  diphenoxylate-atropine (LOMOTIL) 2.5-0.025 MG tablet Take 1 tablet by mouth 4 (four) times daily as needed for diarrhea or loose stools. 12/04/15   [name], PA-C  fluticasone (FLONASE) 50 MCG/ACT nasal spray Place 1 spray into both nostrils daily as needed for allergies.     Historical Provider, MD  lipase/protease/amylase (CREON) 12000 UNITS CPEP capsule Take 2 capsules (24,000 Units total) by mouth 3 (three) times daily with meals. 01/15/15   [name], MD  metoprolol tartrate (LOPRESSOR) 25 MG tablet Take 1 tablet (25 mg total) by mouth 2 (two) times daily. 12/14/15   Tiffany Daneil Dan, PA-C  metroNIDAZOLE (FLAGYL) 500 MG tablet Take 1 tablet (500 mg total) by mouth 2 (two) times daily. 01/25/16   Noemi Chapel, MD  omeprazole (PRILOSEC) 20 MG capsule Take 1 capsule (20 mg total) by mouth daily. 11/15/15   [place]aya Lions, PA-C  ondansetron (ZOFRAN ODT) 8 MG disintegrating tablet Take 1 tablet (8 mg total) by mouth every 8 (eight) hours as needed for nausea or vomiting. 01/25/16   [name], MD  oxyCODONE-acetaminophen (PERCOCET/ROXICET) 5-325 MG tablet Take 2 tablets by mouth every 4 (four) hours as needed. 12/14/15   Tiffany Daneil Dan, PA-C  QUEtiapine (SEROQUEL) 50 MG tablet Take 100 mg by mouth at bedtime. 01/15/15   Historical Provider, MD  sertraline (ZOLOFT) 25 MG tablet Take 75 mg by mouth daily.    Historical Provider, MD  sildenafil (VIAGRA) 50 MG tablet Take 1 tablet (50 mg total) by mouth daily as needed for erectile dysfunction. 12/14/15   Brayton Caves, PA-C  sucralfate (CARAFATE) 1 g tablet Take 1 tablet (1 g total) by mouth 4 (four) times daily -  with meals and at bedtime. 11/15/15   [place] Lions, PA-C    Family History  Family History  Problem Relation Age of Onset  . Hypertension Other   . Coronary artery disease Other     Social History Social History  Substance Use Topics  . Smoking status: Current Every Day Smoker    Packs/day: 1.00    Years: 30.00    Types: Cigarettes  . Smokeless tobacco: Current User    Types: Chew  . Alcohol use 57.6 oz/week    96 Cans of beer per week     Allergies   Shellfish-derived products; Trazodone and nefazodone; and Reglan [metoclopramide]   Review of Systems Review of Systems  All other systems reviewed and are negative.    Physical Exam Updated Vital Signs BP 128/91   Pulse 86   Temp 98.8 F (37.1 C) (Oral)   Resp 15   Ht 5\' 10"  (1.778 m)   Wt 68 kg   SpO2 100%   BMI 21.52 kg/m   Physical Exam  Constitutional: He is oriented to person, place, and time. He appears well-developed and well-nourished. No distress.  HENT:  Head: Normocephalic and atraumatic.  Mouth/Throat: No oropharyngeal exudate.  Eyes: Conjunctivae and EOM are normal. Pupils are equal, round, and reactive to light. Right eye exhibits no discharge. Left  eye exhibits no discharge. No scleral icterus.  Cardiovascular: Normal rate, regular rhythm, normal heart sounds and intact distal pulses.  Exam reveals no gallop and no friction rub.   No murmur heard. Pulmonary/Chest: Effort normal and breath sounds normal. No respiratory distress. He has no wheezes. He has no rales. He exhibits no tenderness.  Abdominal: Soft. Bowel sounds are normal. He exhibits no distension and no mass. There is tenderness ( Epigastric and left lower quadrant). There is no rebound and no guarding. No hernia.  Musculoskeletal: Normal range of motion. He exhibits no edema.  Neurological: He is alert and oriented to person, place, and time.  Skin: Skin is warm and dry. No rash noted. He is not diaphoretic. No erythema. No pallor.  Psychiatric: He has a normal mood and affect. His behavior is normal.  Nursing note and vitals reviewed.    ED Treatments / Results  Labs (all labs ordered are listed, but only abnormal results are displayed) Labs Reviewed  COMPREHENSIVE METABOLIC PANEL - Abnormal; Notable for the following:       Result Value   BUN <5 (*)    Creatinine, Ser 0.57 (*)    AST 51 (*)    All other components within normal limits  CBC - Abnormal; Notable for the following:    RBC 4.00 (*)    Hemoglobin 12.2 (*)    HCT 37.8 (*)    All other components within normal limits  URINALYSIS, ROUTINE W REFLEX MICROSCOPIC (NOT AT [facility]) - Abnormal; Notable for the following:    APPearance CLOUDY (*)    All other components within normal limits  LIPASE, BLOOD  I-STAT TROPOININ, ED    EKG  EKG Interpretation  Date/Time:  Saturday February 06 2016 05:21:18 EDT Ventricular Rate:  86 PR Interval:    QRS Duration: 84 QT Interval:  313 QTC Calculation: 375 R Axis:   72 Text Interpretation:  Sinus rhythm Atrial premature complex Short PR interval Biatrial enlargement Borderline repolarization abnormality Confirmed by Hazle Coca [phone]) on 02/06/2016 5:24:32 AM         Radiology Dg Chest 2 View  Result Date: 02/06/2016 CLINICAL DATA:  45 y/o M; left upper quadrant and centralized chest pain this morning. EXAM: CHEST  2  VIEW COMPARISON:  None. FINDINGS: The heart size and mediastinal contours are within normal limits and stable. Both lungs are clear. The visualized skeletal structures are unremarkable. IMPRESSION: No active cardiopulmonary disease. Electronically Signed   By: Kristine Garbe M.D.   On: 02/06/2016 06:06    Procedures Procedures (including critical care time)  Medications Ordered in ED Medications  gi cocktail (Maalox,Lidocaine,Donnatal) (30 mLs Oral Given 02/06/16 [id number])  ketorolac (TORADOL) 30 MG/ML injection 30 mg (30 mg Intravenous Given 02/06/16 0633)  LORazepam (ATIVAN) injection 0.5 mg (0.5 mg Intravenous Given 02/06/16 0745)     Initial Impression / Assessment and Plan / ED Course  I have reviewed the triage vital signs and the nursing notes.  Pertinent labs & imaging results that were available during my care of the patient were reviewed by me and considered in my medical decision making (see chart for details).  Clinical Course   56 her old male with history of chronic abdominal pain, pancreatitis and EtOH abuse presents to the ED today complaining of increase in his abdominal pain and chest pain onset last night. Patient also reports nausea and 5 or 6 episodes of diarrhea after midnight. On presentation to ED patient overall pills well, nontoxic and nonseptic appearing. Patient is well-known to the emergency department intubated with presents with similar symptoms. Labs unremarkable. No metabolic derangement or leukocytosis. Troponin within normal limits. EKG unremarkable. Doubt ACS. Patient's pain is likely an acute on chronic exacerbation of his chronic abdominal pain he was given GI cocktail, Toradol and Ativan and IV fluids in the ED with symptomatic improvement. He will follow up with his primary doctor as well as a  gastroenterologist for further evaluation. Patient states that he is getting the orange card on Monday and will be able to see a GI doctor for his ongoing abdominal pain. No further episodes of emesis or diarrhea while in the ED. He is tolerating fluids by mouth. Return precautions outlined in patient discharge instructions. Patient is hemodynamically stable and ready for discharge.   Final Clinical Impressions(s) / ED Diagnoses   Final diagnoses:  None    New Prescriptions New Prescriptions   No medications on file     Carlos Levering, PA-C 02/06/16 [place], MD 02/06/16 2256

## 2016-02-06 NOTE — ED Notes (Signed)
At discharge pt was given bus pass and something to eat.

## 2016-02-06 NOTE — ED Triage Notes (Signed)
Pt to ED by EMS c/o LUQ and centralized chest pain onset this morning. Hx of pancreatitis and WPW. EMS gave 324mg  ASA

## 2016-02-06 NOTE — Discharge Instructions (Signed)
Follow up with PCP and gastroenterology for ongoing abdominal pain. Take home pain medications as prescribed. Avoid alcohol consumption. Return to the ED if you experience severe worsening of your symptoms, fevers, blood in stool, blood in vomit.

## 2016-02-06 NOTE — ED Notes (Signed)
Patient transported to X-ray 

## 2016-02-12 ENCOUNTER — Encounter (HOSPITAL_COMMUNITY): Payer: Self-pay | Admitting: *Deleted

## 2016-02-12 ENCOUNTER — Emergency Department (HOSPITAL_COMMUNITY)
Admission: EM | Admit: 2016-02-12 | Discharge: 2016-02-12 | Disposition: A | Discharge status: Home / Self Care | Payer: Self-pay | Attending: Emergency Medicine | Admitting: Emergency Medicine

## 2016-02-12 DIAGNOSIS — J45909 Unspecified asthma, uncomplicated: Secondary | ICD-10-CM | POA: Insufficient documentation

## 2016-02-12 DIAGNOSIS — R109 Unspecified abdominal pain: Secondary | ICD-10-CM

## 2016-02-12 DIAGNOSIS — F17210 Nicotine dependence, cigarettes, uncomplicated: Secondary | ICD-10-CM | POA: Insufficient documentation

## 2016-02-12 DIAGNOSIS — D649 Anemia, unspecified: Secondary | ICD-10-CM | POA: Insufficient documentation

## 2016-02-12 DIAGNOSIS — IMO0002 Reserved for concepts with insufficient information to code with codable children: Secondary | ICD-10-CM

## 2016-02-12 DIAGNOSIS — R197 Diarrhea, unspecified: Secondary | ICD-10-CM | POA: Insufficient documentation

## 2016-02-12 DIAGNOSIS — G8929 Other chronic pain: Secondary | ICD-10-CM | POA: Insufficient documentation

## 2016-02-12 DIAGNOSIS — Z79899 Other long term (current) drug therapy: Secondary | ICD-10-CM | POA: Insufficient documentation

## 2016-02-12 DIAGNOSIS — I10 Essential (primary) hypertension: Secondary | ICD-10-CM | POA: Insufficient documentation

## 2016-02-12 DIAGNOSIS — F1099 Alcohol use, unspecified with unspecified alcohol-induced disorder: Secondary | ICD-10-CM | POA: Insufficient documentation

## 2016-02-12 DIAGNOSIS — R740 Nonspecific elevation of levels of transaminase and lactic acid dehydrogenase [LDH]: Secondary | ICD-10-CM | POA: Insufficient documentation

## 2016-02-12 DIAGNOSIS — R7401 Elevation of levels of liver transaminase levels: Secondary | ICD-10-CM

## 2016-02-12 DIAGNOSIS — K860 Alcohol-induced chronic pancreatitis: Secondary | ICD-10-CM | POA: Insufficient documentation

## 2016-02-12 LAB — URINALYSIS, ROUTINE W REFLEX MICROSCOPIC
BILIRUBIN URINE: NEGATIVE
GLUCOSE, UA: NEGATIVE mg/dL
HGB URINE DIPSTICK: NEGATIVE
Ketones, ur: NEGATIVE mg/dL
Leukocytes, UA: NEGATIVE
Nitrite: NEGATIVE
PH: 6 (ref 5.0–8.0)
Protein, ur: NEGATIVE mg/dL
SPECIFIC GRAVITY, URINE: 1.012 (ref 1.005–1.030)

## 2016-02-12 LAB — COMPREHENSIVE METABOLIC PANEL
ALT: 64 U/L — ABNORMAL HIGH (ref 17–63)
ANION GAP: 9 (ref 5–15)
AST: 66 U/L — ABNORMAL HIGH (ref 15–41)
Albumin: 3.9 g/dL (ref 3.5–5.0)
Alkaline Phosphatase: 98 U/L (ref 38–126)
BUN: 5 mg/dL — ABNORMAL LOW (ref 6–20)
CALCIUM: 9 mg/dL (ref 8.9–10.3)
CHLORIDE: 107 mmol/L (ref 101–111)
CO2: 24 mmol/L (ref 22–32)
CREATININE: 0.78 mg/dL (ref 0.61–1.24)
GLUCOSE: 102 mg/dL — AB (ref 65–99)
Potassium: 3.4 mmol/L — ABNORMAL LOW (ref 3.5–5.1)
SODIUM: 140 mmol/L (ref 135–145)
Total Bilirubin: 0.5 mg/dL (ref 0.3–1.2)
Total Protein: 6.7 g/dL (ref 6.5–8.1)

## 2016-02-12 LAB — CBC
HCT: 37.5 % — ABNORMAL LOW (ref 39.0–52.0)
HEMOGLOBIN: 12.2 g/dL — AB (ref 13.0–17.0)
MCH: 31 pg (ref 26.0–34.0)
MCHC: 32.5 g/dL (ref 30.0–36.0)
MCV: 95.4 fL (ref 78.0–100.0)
Platelets: 295 10*3/uL (ref 150–400)
RBC: 3.93 MIL/uL — ABNORMAL LOW (ref 4.22–5.81)
RDW: 15 % (ref 11.5–15.5)
WBC: 9.2 10*3/uL (ref 4.0–10.5)

## 2016-02-12 LAB — LIPASE, BLOOD: LIPASE: 31 U/L (ref 11–51)

## 2016-02-12 LAB — ETHANOL

## 2016-02-12 MED ORDER — OMEPRAZOLE 20 MG PO CPDR: 20.0000 mg | DELAYED_RELEASE_CAPSULE | Freq: Every day | ORAL | 0 refills | Status: DC

## 2016-02-12 MED ORDER — CHOLESTYRAMINE 4 G PO PACK: 4.0000 g | PACK | Freq: Three times a day (TID) | ORAL | 12 refills | Status: DC | PRN

## 2016-02-12 MED ORDER — GI COCKTAIL ~~LOC~~
30.0000 mL | Freq: Once | ORAL | Status: AC
  Administered 2016-02-12: 30 mL via ORAL
  Filled 2016-02-12: qty 30

## 2016-02-12 MED ORDER — PANTOPRAZOLE SODIUM 40 MG PO TBEC
40.0000 mg | DELAYED_RELEASE_TABLET | Freq: Once | ORAL | Status: AC
  Administered 2016-02-12: 40 mg via ORAL
  Filled 2016-02-12: qty 1

## 2016-02-12 NOTE — ED Provider Notes (Signed)
[facility] DEPT Provider Note   CSN: [id number] Arrival date & time: 02/12/16  0257     History   Chief Complaint Chief Complaint  Patient presents with  . Abdominal Pain    HPI [name] is a 45 y.o. male. He has a history of chronic pancreatitis and alcohol abuse. He comes in with abdominal pain and diarrhea over the last 2 days. Pain started after he had had that 2 shots of liquor and some beer. He rates pain at 8/10. There is no nausea or vomiting. He states he has had 8 bowel movements over the last 24 hours. Loperamide has not been effective and treating his diarrhea. He denies fever or chills or sweats. He is requesting a GI cocktail. He has been compliant with his sucralfate but states that he had run out of omeprazole and needed new prescription.  The history is provided by the patient.  Abdominal Pain      Past Medical History:  Diagnosis Date  . Alcoholism /alcohol abuse ([place])   . Anemia   . Anxiety   . Arthritis    "knees; arms; elbows" (03/26/2015)  . Asthma   . Bipolar disorder ([place])   . Chronic bronchitis ([place])   . Chronic lower back pain   . Cocaine abuse   . Depression   . Family history of adverse reaction to anesthesia    "grandmother gets confused"  . Femoral condyle fracture ([place]) 03/08/2014   left medial/notes 03/09/2014  . GERD (gastroesophageal reflux disease)   . H/O hiatal hernia   . H/O suicide attempt 10/2012  . Heart murmur    "when he was little" (03/06/2013)  . High cholesterol   . History of blood transfusion 10/2012   "when I tried to commit suicide"  . History of stomach ulcers   . Hypertension   . Marijuana abuse, continuous   . Migraine    "a few times/year" (03/26/2015)  . Pancreatitis   . Pneumonia 1990's X 3  . PTSD (post-traumatic stress disorder)   . Shortness of breath    "can happen at anytime" (03/06/2013)  . Sickle cell trait ([place])   . WPW (Wolff-Parkinson-White syndrome)    [name] 03/06/2013     Patient Active Problem List   Diagnosis Date Noted  . Leukocytosis   . Hospital acquired PNA 05/20/2015  . Pancreatitis 05/18/2015  . Pseudocyst of pancreas 05/18/2015  . Malnutrition of moderate degree 03/27/2015  . Abnormal transaminases 03/26/2015  . Polysubstance abuse (tobacco, cocaine, THC, and ETOH) 03/26/2015  . Acute pancreatitis 03/26/2015  . Alcohol-induced chronic pancreatitis ([place])   . Essential hypertension 02/06/2014  . Alcohol-induced acute pancreatitis 11/28/2013  . Pancreatic pseudocyst/cyst 11/25/2013  . Alcohol dependence ([place]) 10/23/2013  . MDD (major depressive disorder), recurrent severe, without psychosis ([place]) 10/22/2013  . Protein-calorie malnutrition, severe ([place]) 10/10/2013  . Suicide attempt 10/08/2013  . Overdose 10/06/2013  . 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 TOBACCO ABUSE 03/23/2007    Past Surgical History:  Procedure Laterality Date  . CARDIAC CATHETERIZATION    . EYE SURGERY Left 1990's   "result of trauma"   . FACIAL FRACTURE SURGERY Left 1990's   "result of trauma"   . FRACTURE SURGERY    . HERNIA REPAIR    . LEFT HEART CATHETERIZATION WITH CORONARY ANGIOGRAM Right 03/07/2013   Procedure: LEFT HEART CATHETERIZATION WITH CORONARY ANGIOGRAM;  Surgeon: [name], MD;  Location: [facility] CATH LAB;  Service: Cardiovascular;  Laterality:  Right;  . UMBILICAL HERNIA REPAIR         Home Medications    Prior to Admission medications   Medication Sig Start Date End Date Taking? Authorizing Provider  albuterol (PROVENTIL HFA;VENTOLIN HFA) 108 (90 BASE) MCG/ACT inhaler Inhale 2 puffs into the lungs every 6 (six) hours as needed for wheezing or shortness of breath (wheezing).     Historical Provider, MD  benzonatate (TESSALON) 200 MG capsule Take 1 capsule (200 mg total) by mouth 2 (two) times daily as needed for cough. 12/14/15   Tiffany Daneil Dan, PA-C  ciprofloxacin (CIPRO) 500 MG tablet Take 1 tablet (500 mg  total) by mouth 2 (two) times daily. 01/25/16   Noemi Chapel, MD  cyclobenzaprine (FLEXERIL) 5 MG tablet Take 1 tablet (5 mg total) by mouth 3 (three) times daily as needed for muscle spasms. 12/09/15   Verner Mould, MD  diphenoxylate-atropine (LOMOTIL) 2.5-0.025 MG tablet Take 1 tablet by mouth 4 (four) times daily as needed for diarrhea or loose stools. 12/04/15   [name], PA-C  fluticasone (FLONASE) 50 MCG/ACT nasal spray Place 1 spray into both nostrils daily as needed for allergies.     Historical Provider, MD  lipase/protease/amylase (CREON) 12000 UNITS CPEP capsule Take 2 capsules (24,000 Units total) by mouth 3 (three) times daily with meals. 01/15/15   [name], MD  metoprolol tartrate (LOPRESSOR) 25 MG tablet Take 1 tablet (25 mg total) by mouth 2 (two) times daily. 12/14/15   Tiffany Daneil Dan, PA-C  metroNIDAZOLE (FLAGYL) 500 MG tablet Take 1 tablet (500 mg total) by mouth 2 (two) times daily. 01/25/16   Noemi Chapel, MD  omeprazole (PRILOSEC) 20 MG capsule Take 1 capsule (20 mg total) by mouth daily. 11/15/15   [place] Lions, PA-C  ondansetron (ZOFRAN ODT) 8 MG disintegrating tablet Take 1 tablet (8 mg total) by mouth every 8 (eight) hours as needed for nausea or vomiting. 01/25/16   [name], MD  oxyCODONE-acetaminophen (PERCOCET/ROXICET) 5-325 MG tablet Take 2 tablets by mouth every 4 (four) hours as needed. Patient taking differently: Take 2 tablets by mouth every 4 (four) hours as needed for moderate pain.  12/14/15   Tiffany Daneil Dan, PA-C  QUEtiapine (SEROQUEL) 50 MG tablet Take 100 mg by mouth at bedtime. 01/15/15   Historical Provider, MD  sertraline (ZOLOFT) 25 MG tablet Take 75 mg by mouth daily.    Historical Provider, MD  sildenafil (VIAGRA) 50 MG tablet Take 1 tablet (50 mg total) by mouth daily as needed for erectile dysfunction. 12/14/15   Brayton Caves, PA-C  sucralfate (CARAFATE) 1 g tablet Take 1 tablet (1 g total) by mouth 4 (four) times daily -   with meals and at bedtime. 11/15/15   [place] Lions, PA-C    Family History Family History  Problem Relation Age of Onset  . Hypertension Other   . Coronary artery disease Other     Social History Social History  Substance Use Topics  . Smoking status: Current Every Day Smoker    Packs/day: 1.00    Years: 30.00    Types: Cigarettes  . Smokeless tobacco: Current User    Types: Chew  . Alcohol use 57.6 oz/week    96 Cans of beer per week     Allergies   Shellfish-derived products; Trazodone and nefazodone; and Reglan [metoclopramide]   Review of Systems Review of Systems  Gastrointestinal: Positive for abdominal pain.  All other systems reviewed and are negative.  Physical Exam Updated Vital Signs BP 142/100 (BP Location: Right Arm)   Pulse 86   Temp 97.9 F (36.6 C) (Oral)   Resp 15   SpO2 100%   Physical Exam  Nursing note and vitals reviewed.  45 year old male, resting comfortably and in no acute distress. Vital signs are significant for hypertension. Oxygen saturation is 100%, which is normal. Head is normocephalic and atraumatic. PERRLA, EOMI. Oropharynx is clear. Neck is nontender and supple without adenopathy or JVD. Back is nontender and there is no CVA tenderness. Lungs are clear without rales, wheezes, or rhonchi. Chest is nontender. Heart has regular rate and rhythm without murmur. Abdomen is soft, flat, with mild epigastric tenderness. There is no rebound or guarding. There are no masses or hepatosplenomegaly and peristalsis is normoactive. Extremities have no cyanosis or edema, full range of motion is present. Skin is warm and dry without rash. Neurologic: Mental status is normal, cranial nerves are intact, there are no motor or sensory deficits.  ED Treatments / Results  Labs (all labs ordered are listed, but only abnormal results are displayed) Labs Reviewed  COMPREHENSIVE METABOLIC PANEL - Abnormal; Notable for the following:        Result Value   Potassium 3.4 (*)    Glucose, Bld 102 (*)    BUN 5 (*)    AST 66 (*)    ALT 64 (*)    All other components within normal limits  CBC - Abnormal; Notable for the following:    RBC 3.93 (*)    Hemoglobin 12.2 (*)    HCT 37.5 (*)    All other components within normal limits  LIPASE, BLOOD  URINALYSIS, ROUTINE W REFLEX MICROSCOPIC (NOT AT [facility])  ETHANOL    Procedures Procedures (including critical care time)  Medications Ordered in ED Medications  gi cocktail (Maalox,Lidocaine,Donnatal) (not administered)  pantoprazole (PROTONIX) EC tablet 40 mg (not administered)     Initial Impression / Assessment and Plan / ED Course  I have reviewed the triage vital signs and the nursing notes.  Pertinent labs & imaging results that were available during my care of the patient were reviewed by me and considered in my medical decision making (see chart for details).  Clinical Course   Exacerbation of chronic abdominal pain and chronic pancreatitis. Screening labs have been ordered at triage and lipase has come back normal. Review of old records shows that his reason is July, he was able to elevate his lipase level. Mild anemia is present which is unchanged from baseline. Mild elevation of transaminases is present and not significantly changed. He is requesting a GI cocktail, and will be given a dose of pantoprazole. No indication for advanced imaging studies. Of note, he does have a care plan with instructions to avoid narcotics if at all possible.  He feels somewhat better after above noted treatment. He is given a new prescription for omeprazole. I suspect diarrhea is secondary to malabsorption from chronic pancreatitis and he is given a prescription for cholestyramine to use as needed for diarrhea. He is referred back to his primary care provider for follow-up.  Final Clinical Impressions(s) / ED Diagnoses   Final diagnoses:  Chronic abdominal pain  Alcohol-induced chronic  pancreatitis (HCC)  Elevated transaminase level  Normochromic normocytic anemia  Alcohol use disorder (HCC)  Diarrhea, unspecified type    New Prescriptions New Prescriptions   CHOLESTYRAMINE (QUESTRAN) 4 G PACKET    Take 1 packet (4 g total) by mouth 3 (  three) times daily as needed (diarrhea).     Delora Fuel, MD [id number]

## 2016-02-12 NOTE — Discharge Instructions (Signed)
Do not drink anything with alcohol in it. If you do, your pain will get worse. It is ok take antacids like Tums, Maalox, Mylanta, Pepto-Bismol as needed.

## 2016-02-20 ENCOUNTER — Encounter (HOSPITAL_COMMUNITY): Payer: Self-pay

## 2016-02-20 ENCOUNTER — Emergency Department (HOSPITAL_COMMUNITY): Payer: Self-pay

## 2016-02-20 DIAGNOSIS — K029 Dental caries, unspecified: Secondary | ICD-10-CM | POA: Insufficient documentation

## 2016-02-20 DIAGNOSIS — L03211 Cellulitis of face: Secondary | ICD-10-CM | POA: Insufficient documentation

## 2016-02-20 DIAGNOSIS — J45909 Unspecified asthma, uncomplicated: Secondary | ICD-10-CM | POA: Insufficient documentation

## 2016-02-20 DIAGNOSIS — F17210 Nicotine dependence, cigarettes, uncomplicated: Secondary | ICD-10-CM | POA: Insufficient documentation

## 2016-02-20 DIAGNOSIS — I10 Essential (primary) hypertension: Secondary | ICD-10-CM | POA: Insufficient documentation

## 2016-02-20 LAB — BASIC METABOLIC PANEL
Anion gap: 10 (ref 5–15)
CALCIUM: 8.6 mg/dL — AB (ref 8.9–10.3)
CO2: 22 mmol/L (ref 22–32)
CREATININE: 0.59 mg/dL — AB (ref 0.61–1.24)
Chloride: 102 mmol/L (ref 101–111)
GFR calc Af Amer: >60 mL/min (ref >60)
GLUCOSE: 84 mg/dL (ref 65–99)
POTASSIUM: 3.4 mmol/L — AB (ref 3.5–5.1)
SODIUM: 134 mmol/L — AB (ref 135–145)

## 2016-02-20 LAB — CBC
HEMATOCRIT: 34.2 % — AB (ref 39.0–52.0)
Hemoglobin: 11.4 g/dL — ABNORMAL LOW (ref 13.0–17.0)
MCH: 30.9 pg (ref 26.0–34.0)
MCHC: 33.3 g/dL (ref 30.0–36.0)
MCV: 92.7 fL (ref 78.0–100.0)
PLATELETS: 238 10*3/uL (ref 150–400)
RBC: 3.69 MIL/uL — ABNORMAL LOW (ref 4.22–5.81)
RDW: 14.8 % (ref 11.5–15.5)
WBC: 9.6 10*3/uL (ref 4.0–10.5)

## 2016-02-20 LAB — I-STAT TROPONIN, ED: Troponin i, poc: 0 ng/mL (ref 0.00–0.08)

## 2016-02-20 NOTE — ED Triage Notes (Signed)
Onset today left jaw swelling that has increased throughout day.  Pt had same 3 weeks ago and was treated with antibiotics.  Onset 2 hours PTA chest pain and shortness of breath.

## 2016-02-21 ENCOUNTER — Emergency Department (HOSPITAL_COMMUNITY): Payer: Self-pay

## 2016-02-21 ENCOUNTER — Emergency Department (HOSPITAL_COMMUNITY)
Admission: EM | Admit: 2016-02-21 | Discharge: 2016-02-21 | Disposition: A | Discharge status: Home / Self Care | Payer: Self-pay | Attending: Emergency Medicine | Admitting: Emergency Medicine

## 2016-02-21 DIAGNOSIS — L03211 Cellulitis of face: Secondary | ICD-10-CM

## 2016-02-21 DIAGNOSIS — K029 Dental caries, unspecified: Secondary | ICD-10-CM

## 2016-02-21 LAB — I-STAT TROPONIN, ED: TROPONIN I, POC: 0 ng/mL (ref 0.00–0.08)

## 2016-02-21 MED ORDER — ONDANSETRON HCL 4 MG/2ML IJ SOLN
4.0000 mg | Freq: Once | INTRAMUSCULAR | Status: AC
  Administered 2016-02-21: 4 mg via INTRAVENOUS
  Filled 2016-02-21: qty 2

## 2016-02-21 MED ORDER — IOPAMIDOL (ISOVUE-300) INJECTION 61%
INTRAVENOUS | Status: AC
  Administered 2016-02-21: 75 mL
  Filled 2016-02-21: qty 75

## 2016-02-21 MED ORDER — KETOROLAC TROMETHAMINE 30 MG/ML IJ SOLN
30.0000 mg | Freq: Once | INTRAMUSCULAR | Status: AC
  Administered 2016-02-21: 30 mg via INTRAVENOUS
  Filled 2016-02-21: qty 1

## 2016-02-21 MED ORDER — CLINDAMYCIN PHOSPHATE 600 MG/50ML IV SOLN
600.0000 mg | Freq: Once | INTRAVENOUS | Status: AC
  Administered 2016-02-21: 600 mg via INTRAVENOUS
  Filled 2016-02-21: qty 50

## 2016-02-21 MED ORDER — PANTOPRAZOLE SODIUM 40 MG PO TBEC
80.0000 mg | DELAYED_RELEASE_TABLET | Freq: Once | ORAL | Status: AC
  Administered 2016-02-21: 80 mg via ORAL
  Filled 2016-02-21: qty 2

## 2016-02-21 MED ORDER — SODIUM CHLORIDE 0.9 % IV BOLUS (SEPSIS)
1000.0000 mL | Freq: Once | INTRAVENOUS | Status: AC
  Administered 2016-02-21: 1000 mL via INTRAVENOUS

## 2016-02-21 MED ORDER — GI COCKTAIL ~~LOC~~
30.0000 mL | Freq: Once | ORAL | Status: AC
  Administered 2016-02-21: 30 mL via ORAL
  Filled 2016-02-21: qty 30

## 2016-02-21 MED ORDER — CLINDAMYCIN HCL 300 MG PO CAPS: 300.0000 mg | ORAL_CAPSULE | Freq: Three times a day (TID) | ORAL | 0 refills | Status: DC

## 2016-02-21 NOTE — ED Notes (Signed)
Patient transported to CT 

## 2016-02-21 NOTE — ED Provider Notes (Signed)
By signing my name below, I, Gwenlyn Fudge, attest that this documentation has been prepared under the direction and in the presence of [place], DO. Electronically Signed: Gwenlyn Fudge, ED Scribe. 02/21/16. 2:06 AM.  TIME SEEN: 1:59 AM  CHIEF COMPLAINT: Facial swelling HPI: [name] is a 45 y.o. male history of chronic pancreatitis, substance abuse, hypertension, LVPW, chronic pain who presents to the Emergency Department complaining of gradual onset, constant left sided jaw swelling onset 1 day. He reports associated pain with jaw swelling. He was seen in ED 3 weeks ago for same symptoms and was compliant with prescribed antibiotics. It appears he was on Cipro and Flagyl for possible colitis. States that the not on the side of his left jaw improved but has now been swelling more over the past 2 days. Reports he has pain with swallowing and states it feels "razor blades".  Patient also complaining of chest pain and epigastric abdominal pain. Abdominal pain is an episodic shooting pain in epigastric region that radiates up to lower chest and lasts for a few seconds. Feels like his previous episodes of pancreatitis. He did drink one beer today. Denies vomiting, fever. States he has had diarrhea. Reports mild cough.  Patient is well-known to our emergency department with 24 visits in the past 6 months. He does have a care plan.  ROS: See HPI Constitutional: no fever  Eyes: no drainage  ENT: no runny nose   Cardiovascular:  Chest pain  Resp: no SOB  GI: no vomiting GU: no dysuria Integumentary: no rash  Allergy: no hives  Musculoskeletal: no leg swelling  Neurological: no slurred speech ROS otherwise negative  PAST MEDICAL HISTORY/PAST SURGICAL HISTORY:  Past Medical History:  Diagnosis Date  . Alcoholism /alcohol abuse ([place])   . Anemia   . Anxiety   . Arthritis    "knees; arms; elbows" (03/26/2015)  . Asthma   . Bipolar disorder ([place])   . Chronic bronchitis ([place])   .  Chronic lower back pain   . Cocaine abuse   . Depression   . Family history of adverse reaction to anesthesia    "grandmother gets confused"  . Femoral condyle fracture ([place]) 03/08/2014   left medial/notes 03/09/2014  . GERD (gastroesophageal reflux disease)   . H/O hiatal hernia   . H/O suicide attempt 10/2012  . Heart murmur    "when he was little" (03/06/2013)  . High cholesterol   . History of blood transfusion 10/2012   "when I tried to commit suicide"  . History of stomach ulcers   . Hypertension   . Marijuana abuse, continuous   . Migraine    "a few times/year" (03/26/2015)  . Pancreatitis   . Pneumonia 1990's X 3  . PTSD (post-traumatic stress disorder)   . Shortness of breath    "can happen at anytime" (03/06/2013)  . Sickle cell trait ([place])   . WPW (Wolff-Parkinson-White syndrome)    [name] 03/06/2013    MEDICATIONS:  Prior to Admission medications   Medication Sig Start Date End Date Taking? Authorizing Provider  albuterol (PROVENTIL HFA;VENTOLIN HFA) 108 (90 BASE) MCG/ACT inhaler Inhale 2 puffs into the lungs every 6 (six) hours as needed for wheezing or shortness of breath (wheezing).     Historical Provider, MD  benzonatate (TESSALON) 200 MG capsule Take 1 capsule (200 mg total) by mouth 2 (two) times daily as needed for cough. 12/14/15   Tiffany Daneil Dan, PA-C  cholestyramine (QUESTRAN) 4 g packet Take  1 packet (4 g total) by mouth 3 (three) times daily as needed (diarrhea). [id number]   Delora Fuel, MD  ciprofloxacin (CIPRO) 500 MG tablet Take 1 tablet (500 mg total) by mouth 2 (two) times daily. 01/25/16   Noemi Chapel, MD  cyclobenzaprine (FLEXERIL) 5 MG tablet Take 1 tablet (5 mg total) by mouth 3 (three) times daily as needed for muscle spasms. 12/09/15   Verner Mould, MD  diphenoxylate-atropine (LOMOTIL) 2.5-0.025 MG tablet Take 1 tablet by mouth 4 (four) times daily as needed for diarrhea or loose stools. 12/04/15   [name], PA-C  fluticasone  (FLONASE) 50 MCG/ACT nasal spray Place 1 spray into both nostrils daily as needed for allergies.     Historical Provider, MD  lipase/protease/amylase (CREON) 12000 UNITS CPEP capsule Take 2 capsules (24,000 Units total) by mouth 3 (three) times daily with meals. 01/15/15   [name], MD  metoprolol tartrate (LOPRESSOR) 25 MG tablet Take 1 tablet (25 mg total) by mouth 2 (two) times daily. 12/14/15   Tiffany Daneil Dan, PA-C  metroNIDAZOLE (FLAGYL) 500 MG tablet Take 1 tablet (500 mg total) by mouth 2 (two) times daily. 01/25/16   Noemi Chapel, MD  omeprazole (PRILOSEC) 20 MG capsule Take 1 capsule (20 mg total) by mouth daily. [id number]   Delora Fuel, MD  ondansetron (ZOFRAN ODT) 8 MG disintegrating tablet Take 1 tablet (8 mg total) by mouth every 8 (eight) hours as needed for nausea or vomiting. 01/25/16   [name], MD  oxyCODONE-acetaminophen (PERCOCET/ROXICET) 5-325 MG tablet Take 2 tablets by mouth every 4 (four) hours as needed. Patient taking differently: Take 2 tablets by mouth every 4 (four) hours as needed for moderate pain.  12/14/15   Tiffany Daneil Dan, PA-C  QUEtiapine (SEROQUEL) 50 MG tablet Take 100 mg by mouth at bedtime. 01/15/15   Historical Provider, MD  sertraline (ZOLOFT) 25 MG tablet Take 75 mg by mouth daily.    Historical Provider, MD  sildenafil (VIAGRA) 50 MG tablet Take 1 tablet (50 mg total) by mouth daily as needed for erectile dysfunction. 12/14/15   Brayton Caves, PA-C  sucralfate (CARAFATE) 1 g tablet Take 1 tablet (1 g total) by mouth 4 (four) times daily -  with meals and at bedtime. 11/15/15   Fridley Lions, PA-C    ALLERGIES:  Allergies  Allergen Reactions  . Shellfish-Derived Products Nausea And Vomiting  . Trazodone And Nefazodone Other (See Comments)    Muscle spasms  . Reglan [Metoclopramide] Other (See Comments)    Unknown    SOCIAL HISTORY:  Social History  Substance Use Topics  . Smoking status: Current Every Day Smoker    Packs/day: 1.00     Years: 30.00    Types: Cigarettes  . Smokeless tobacco: Current User    Types: Chew  . Alcohol use 7.2 oz/week    12 Cans of beer per week    FAMILY HISTORY: Family History  Problem Relation Age of Onset  . Hypertension Other   . Coronary artery disease Other     EXAM: BP 143/89   Pulse 79   Temp 98.3 F (36.8 C) (Oral)   Resp 14   Ht 5\' 10"  (1.778 m)   Wt 134 lb 3.2 oz (60.9 kg)   SpO2 100%   BMI 19.26 kg/m  CONSTITUTIONAL: Alert and oriented and responds appropriately to questions. Chronically ill appearing; well-nourished, afebrile, no significant distress HEAD: Normocephalic EYES: Conjunctivae clear, PERRL ENT: normal nose; no rhinorrhea;  moist mucous membranes; No pharyngeal erythema or petechiae, no tonsillar hypertrophy or exudate, no uvular deviation, no trismus or drooling, normal phonation, no stridor, + dental caries present, no drainable dental abscess noted, no Ludwig's angina, tongue sits flat in the bottom of the mouth, no angioedema, no facial erythema but patient does have a large indurated area noted to the left mandible that is slightly underneath the left mandible and is warm without fluctuance or drainage NECK: Supple, no meningismus, no LAD vomiting no cortical rigidity, full range of motion in the neck  CARD: RRR; S1 and S2 appreciated; no murmurs, no clicks, no rubs, no gallops RESP: Normal chest excursion without splinting or tachypnea; breath sounds clear and equal bilaterally; no wheezes, no rhonchi, no rales, no hypoxia or respiratory distress, speaking full sentences ABD/GI: Normal bowel sounds; non-distended; soft, mildly tender in the epigastric region, no rebound, no guarding, no peritoneal signs BACK:  The back appears normal and is non-tender to palpation, there is no CVA tenderness EXT: Normal ROM in all joints; non-tender to palpation; no edema; normal capillary refill; no cyanosis, no calf tenderness or swelling    SKIN: Normal color for age  and race; warm; no rash NEURO: Moves all extremities equally, sensation to light touch intact diffusely, cranial nerves II through XII intact PSYCH: The patient's mood and manner are appropriate. Grooming and personal hygiene are appropriate.  MEDICAL DECISION MAKING: Patient here with complaints of left-sided facial swelling, pain and sore throat. He does appear to have facial cellulitis and possible abscess. Is difficult to determine based on exam if this abscess is superficial or related to dental infection. We'll obtain a CT of his soft tissues of his neck for further evaluation as he does report pain with swallowing. Posterior oropharynx however appears normal. No Ludwig's angina on exam. No stridor, normal phonation. Airway appears patent.   Patient also complaining of atypical chest pain. Seems to be related to his chronic pancreatitis. Does report drinking 1 beer today which she knows exacerbates his symptoms. States that GI cocktails have helped him in the past. I do not feel he needs emergent imaging of his abdomen. Labs including troponin ordered in triage are unremarkable. Chest x-ray clear. We'll repeat a second troponin given he does have risk factors for ACS although again I feel this is very atypical. Doubt PE or dissection.  ED PROGRESS: Patient's second troponin is negative.  CT scan shows "Left maxillary and mandibular molar dental carie and periapical cysts with right maxillary molar absent crown with periapical cyst compatible with odontogenic disease." No abscess seen.  Will treat for facial cellulitis. Will discharge on clindamycin. Have advised him to quit drinking. He is already on a PPI. I do not feel he should be discharged with narcotics. Discussed return precautions. Given outpatient follow-up.   At this time, I do not feel there is any life-threatening condition present. I have reviewed and discussed all results (EKG, imaging, lab, urine as appropriate), exam findings with  patient/family. I have reviewed nursing notes and appropriate previous records.  I feel the patient is safe to be discharged home without further emergent workup and can continue workup as an outpatient as needed. Discussed usual and customary return precautions. Patient/family verbalize understanding and are comfortable with this plan.  Outpatient follow-up has been provided. All questions have been answered.    EKG Interpretation  Date/Time:  Saturday February 20 2016 19:21:42 EDT Ventricular Rate:  73 PR Interval:  110 QRS Duration: 78 QT Interval:  318 QTC Calculation: 350 R Axis:   72 Text Interpretation:  Sinus rhythm with short PR T wave abnormality, consider inferolateral ischemia Abnormal ECG No significant change since last tracing Confirmed by Mirna Sutcliffe,  DO, Anaija Wissink [id number]) on 02/21/2016 1:56:49 AM        I personally performed the services described in this documentation, which was scribed in my presence. The recorded information has been reviewed and is accurate.     [place], DO 02/21/16 [phone]

## 2016-03-05 IMAGING — CT CT NECK W/ CM
3 of 5 series · 12 of 33 positions shown, 14 images · IV contrast (Iodine)
Comparison: CT cervical spine 08/14/2011.

CLINICAL DATA: 43-year-old male with headache, blunt trauma on the
right side of the neck. Initial encounter.

EXAM:
CT NECK WITH CONTRAST
TECHNIQUE: Multidetector CT imaging of the neck was performed using the
standard protocol following the bolus administration of intravenous
contrast.
CONTRAST:  75mL OMNIPAQUE IOHEXOL 300 MG/ML  SOLN

[Series 204: coronal · coronal · 0.54mm/px · 3 of 78 slices shown]
[im 19/78  bone]
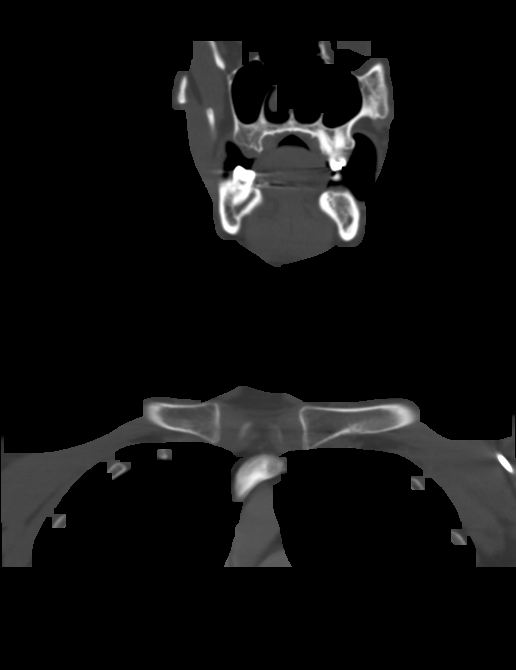
[im 32/78  bone]
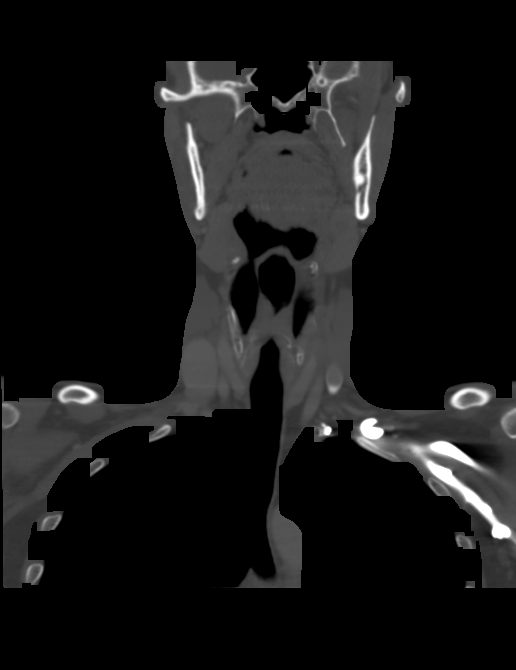
[im 46/78  bone]
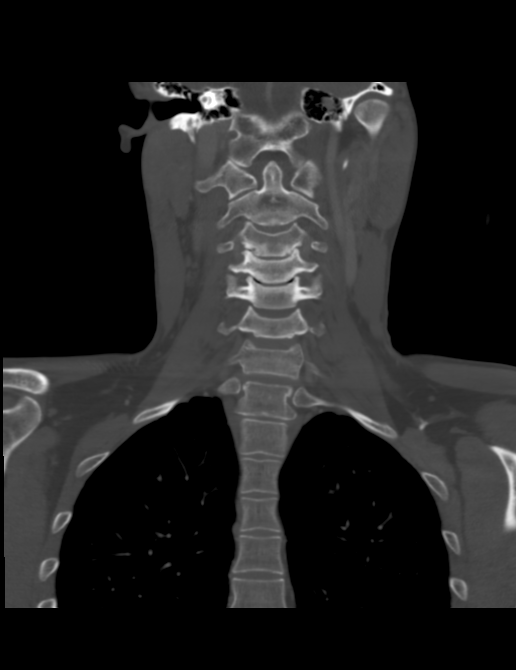

[Series 205: sagittal · sagittal · 0.50mm/px · 5 of 102 slices shown, 6 images]
[im 34/102  bone]
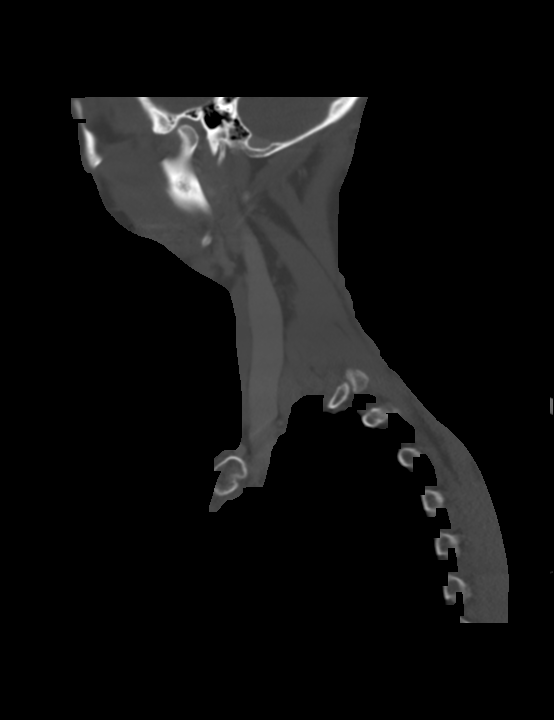
[im 43/102  bone]
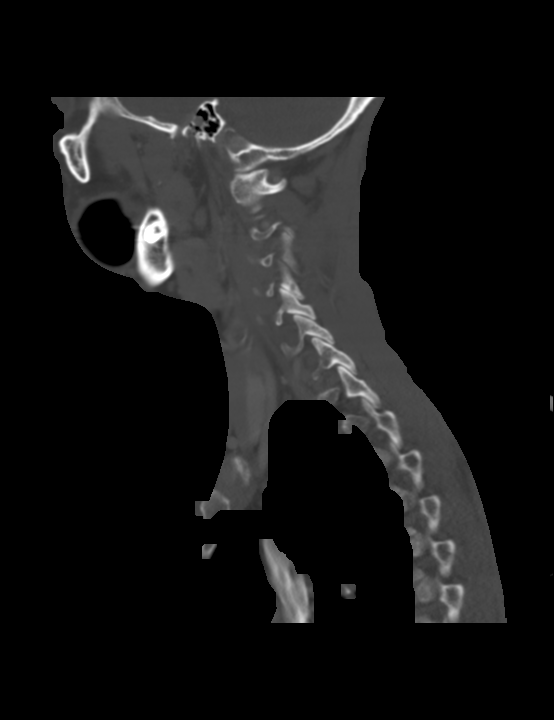
[im 51/102  soft-tissue]
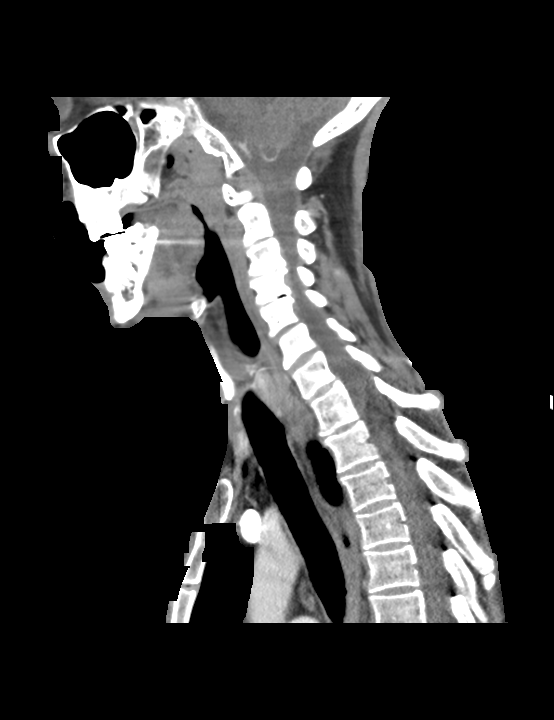
[im 51/102  bone]
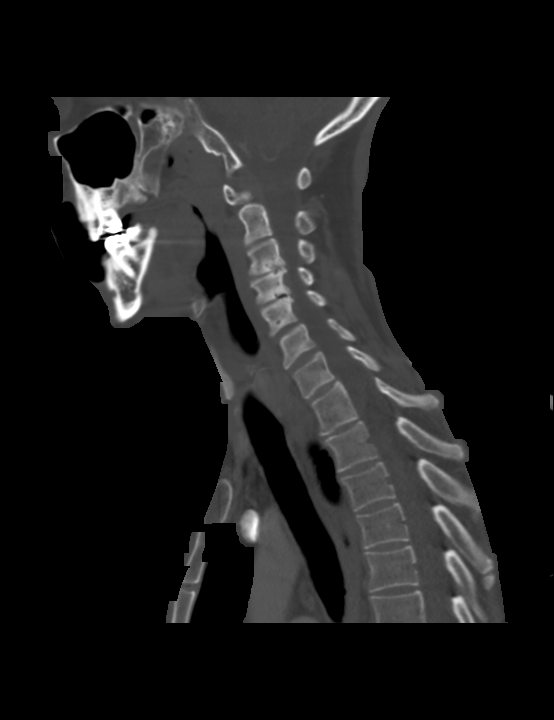
[im 59/102  bone]
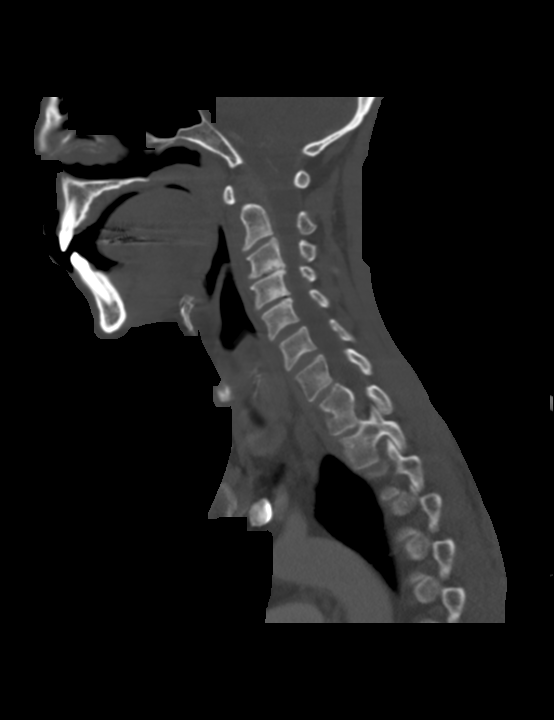
[im 68/102  bone]
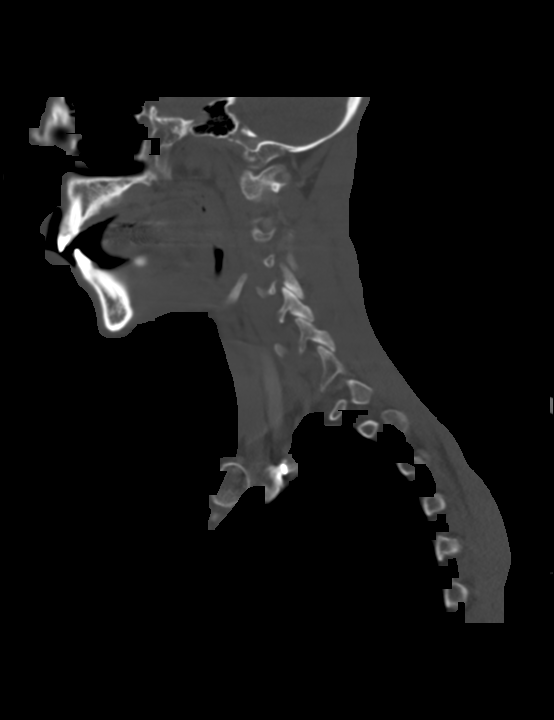

[Series 206: orthogs · axial · 0.54mm/px · z∈[+78,+222]mm · 4 of 125 slices shown, 5 images]
[im 25/125  soft-tissue]
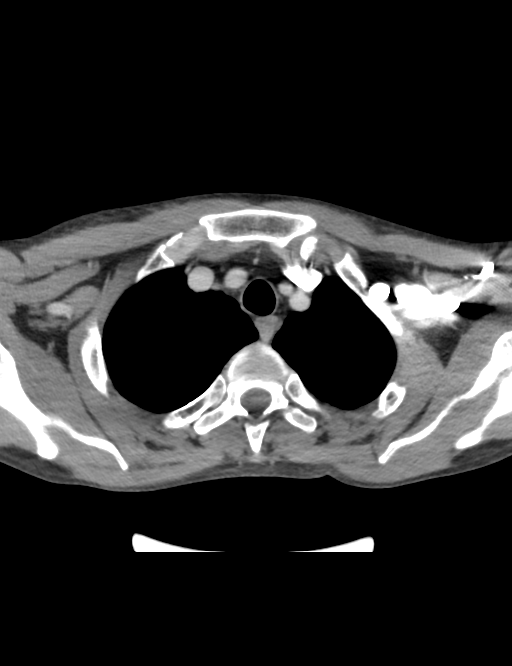
[im 25/125  bone]
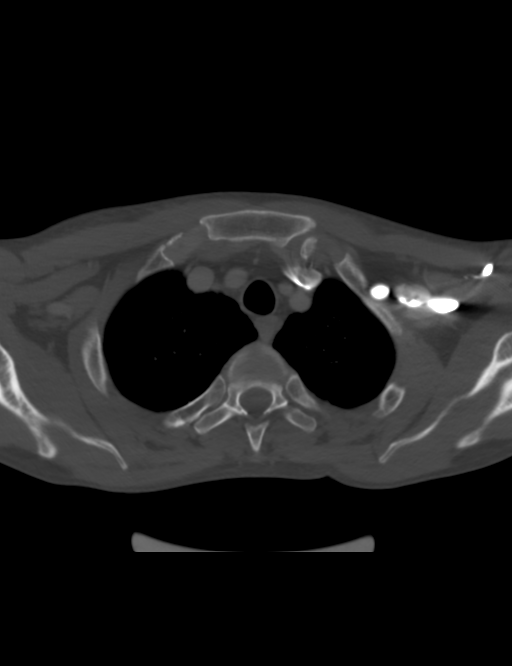
[im 50/125  bone]
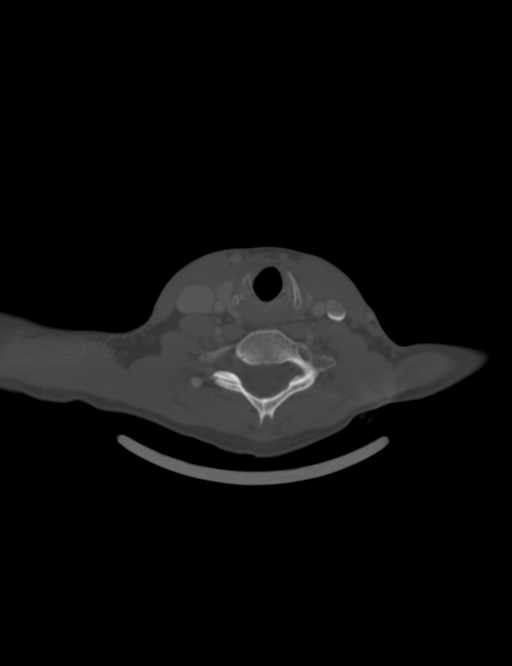
[im 75/125  bone]
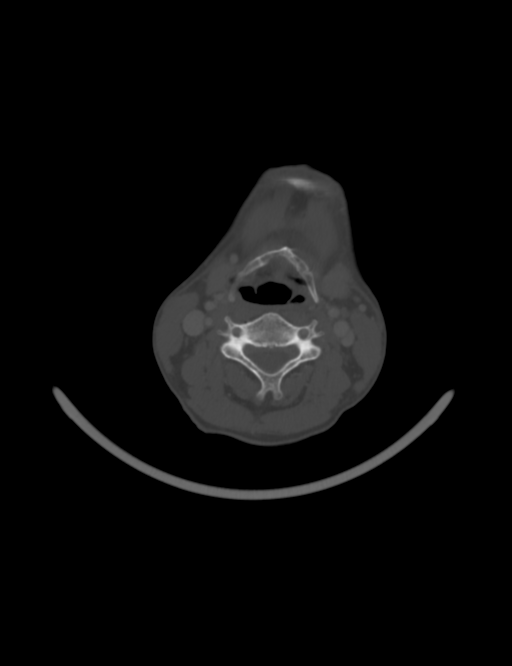
[im 100/125  bone]
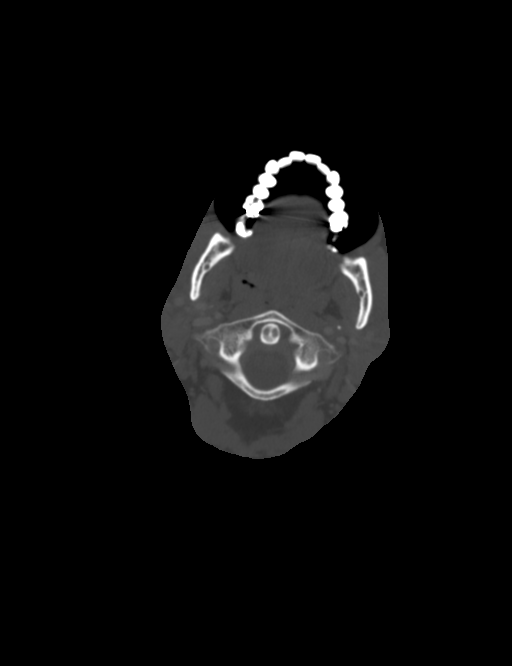

[12 of 33 positions shown; findings below may reference images not displayed]

FINDINGS: Chronic paraseptal emphysema in the upper lobes greater on the
right. Mild peribronchial thickening. No superior mediastinal
lymphadenopathy. No acute osseous abnormality in the visible thorax.

Negative thyroid, larynx (glottis closed), pharynx (small 15 mm
chronic right laryngocele), parapharyngeal spaces, retropharyngeal
space, and sublingual space. Submandibular glands and parotid glands
are within normal limits. Visualized orbit soft tissues are within
normal limits.

There is a chronic left lamina papyracea fracture. Nearby metallic
density along the floor of the left orbit related to ORIF.
Visualized paranasal sinuses and mastoids are clear.

Gas in the oral cavity ("puffed cheek appearance" ). Oral cavity
otherwise normal.

Chronic mild spondylolisthesis in the upper cervical spine appears
stable along with chronic C3-C4 and C4-C5 disc and endplate
degeneration. Chronic multifactorial spinal stenosis at C3-C4
appears to be mild or moderate. No acute osseous abnormality
identified.

No cervical lymphadenopathy. Major vascular structures in the neck
and at the skullbase are patent. No soft tissue injury identified.
IMPRESSION: No acute findings in the neck.

Chronic cervical spine degeneration with multifactorial spinal
stenosis at C3-C4.

## 2016-03-05 IMAGING — CT CT HEAD W/O CM
2 series · 16 of 30 positions shown, 18 images · non-contrast
Comparison: 03/16/2013

CLINICAL DATA: Headache.

EXAM:
CT HEAD WITHOUT CONTRAST
TECHNIQUE: Contiguous axial images were obtained from the base of the skull
through the vertex without intravenous contrast.

[Series 201: head w/o, idose (1) · axial · non-contrast · 0.49mm/px · z∈[+304,+414]mm · 8 of 30 slices shown, 10 images]
[im 4/30  brain]
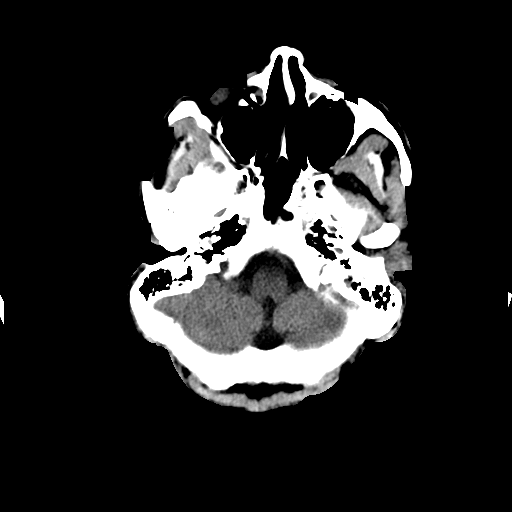
[im 4/30  bone]
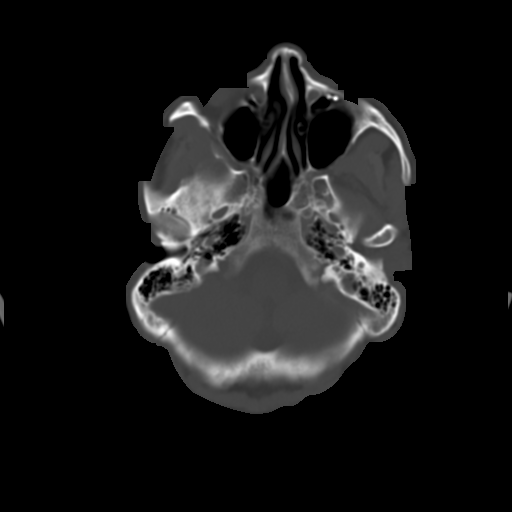
[im 7/30  brain]
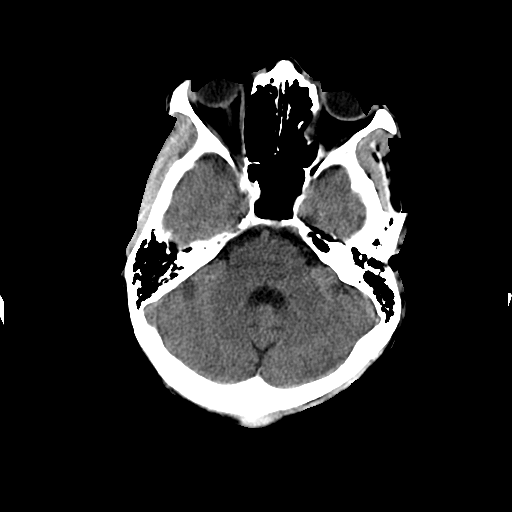
[im 10/30  brain]
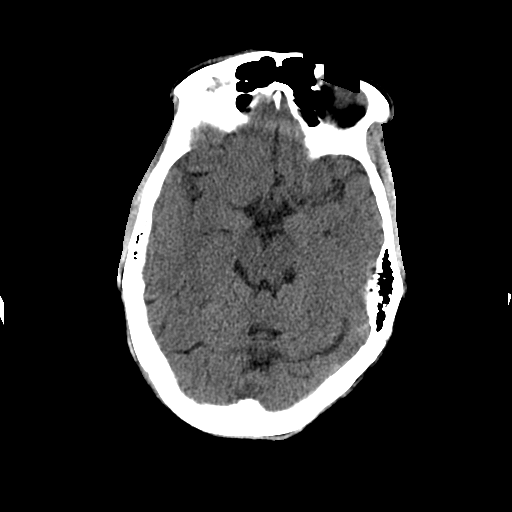
[im 13/30  brain]
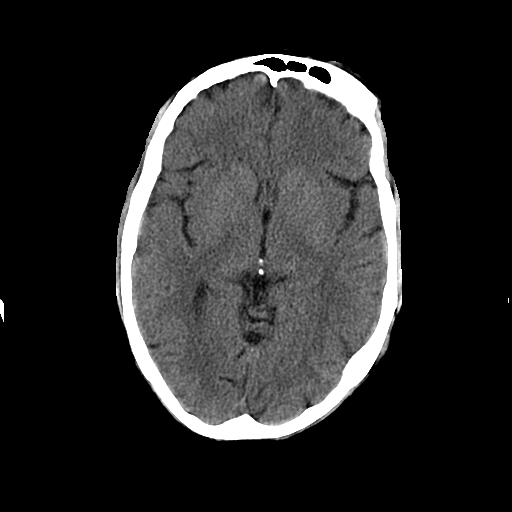
[im 17/30  brain]
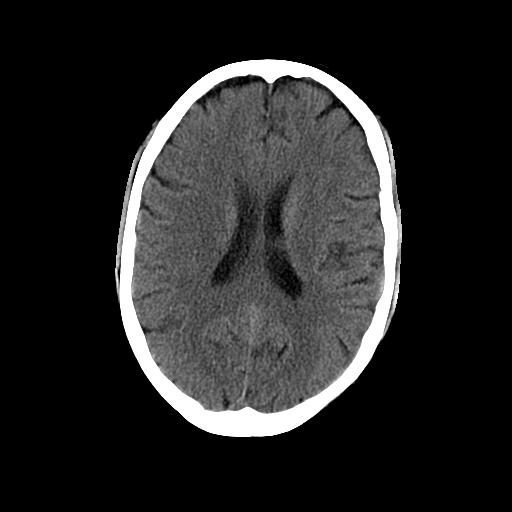
[im 17/30  bone]
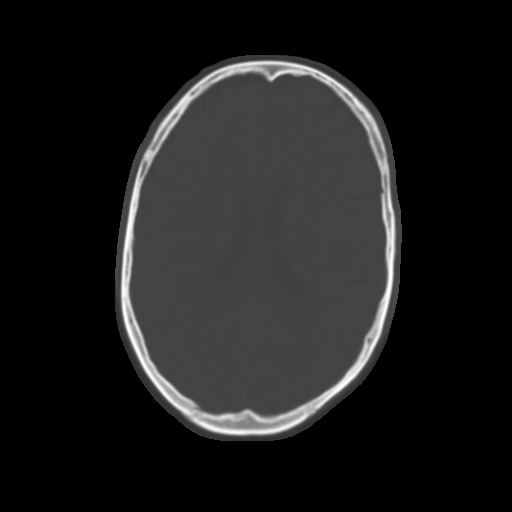
[im 20/30  brain]
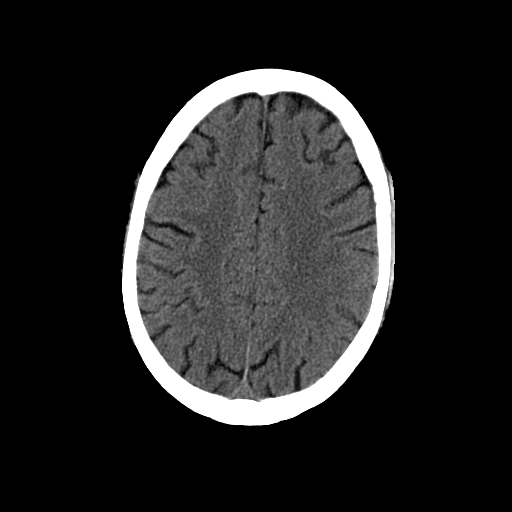
[im 23/30  brain]
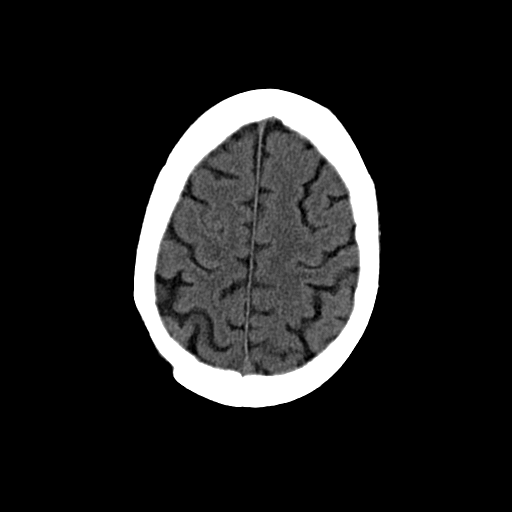
[im 26/30  brain]
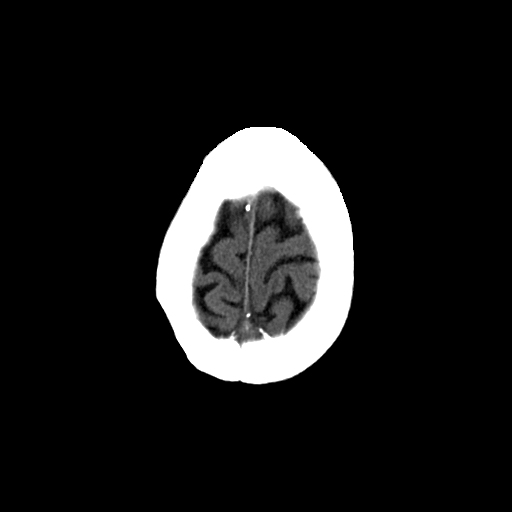

[Series 202: head w/o bone, idose (1) · axial · non-contrast · 0.49mm/px · z∈[+302,+420]mm · 8 of 61 slices shown]
[im 7/61  bone]
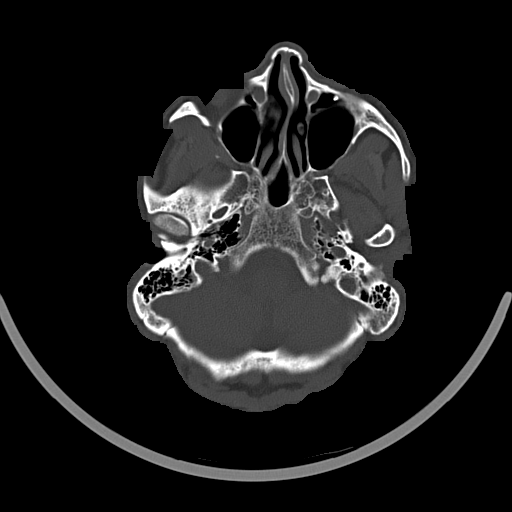
[im 13/61  bone]
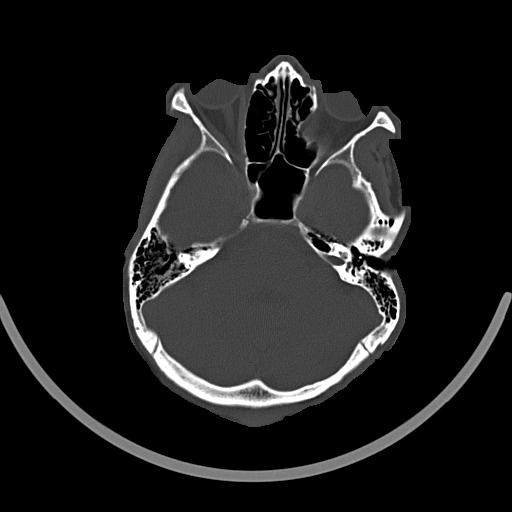
[im 19/61  bone]
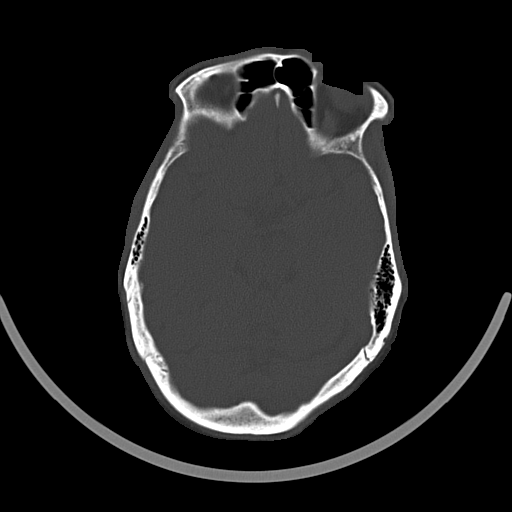
[im 26/61  bone]
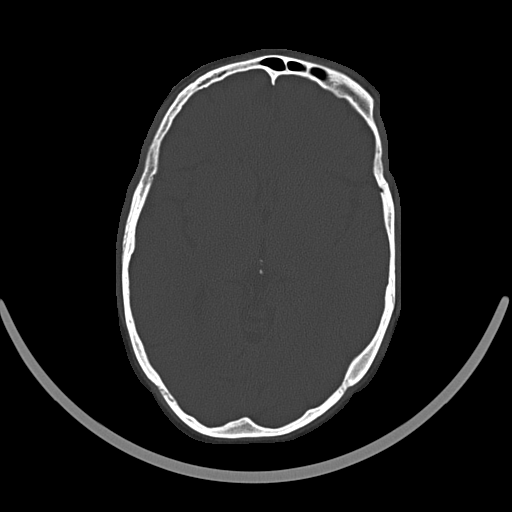
[im 35/61  bone]
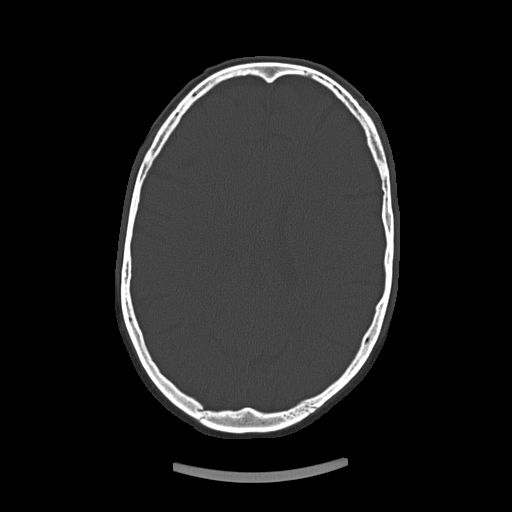
[im 42/61  bone]
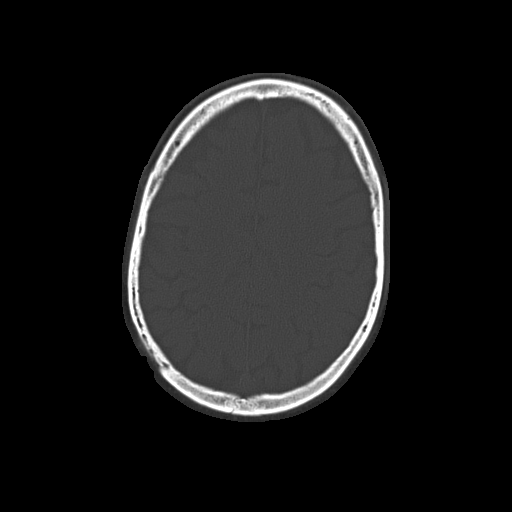
[im 48/61  bone]
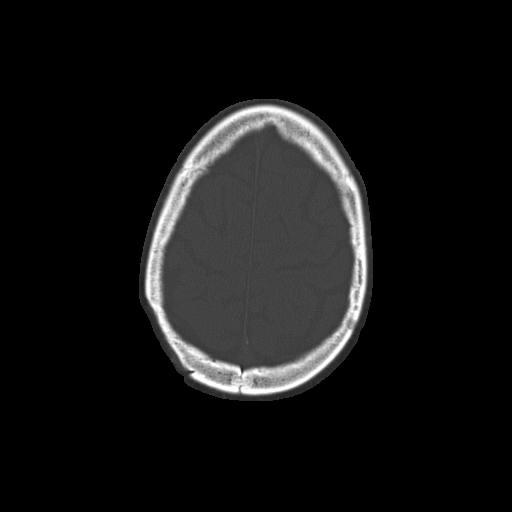
[im 54/61  bone]
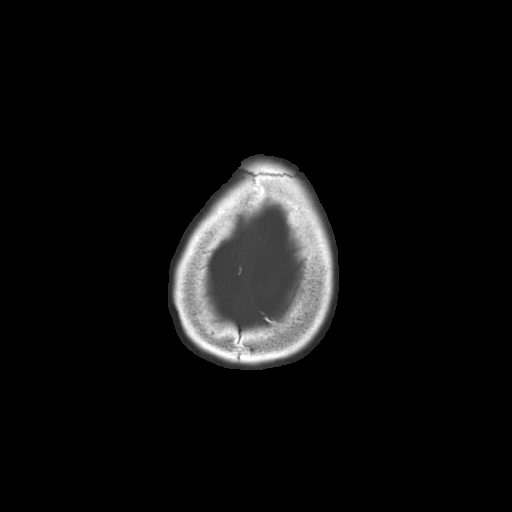

[16 of 30 positions shown; findings below may reference images not displayed]

FINDINGS: No acute cortical infarct, hemorrhage, or mass lesion ispresent.
Ventricles are of normal size. No significant extra-axial fluid
collection is present. The paranasal sinuses andmastoid air cells
are clear. The osseous skull is intact.
IMPRESSION: 1. No acute intracranial abnormalities.

## 2016-03-11 ENCOUNTER — Encounter (HOSPITAL_COMMUNITY): Payer: Self-pay | Admitting: Emergency Medicine

## 2016-03-11 ENCOUNTER — Emergency Department (HOSPITAL_COMMUNITY)
Admission: EM | Admit: 2016-03-11 | Discharge: 2016-03-11 | Disposition: A | Discharge status: Home / Self Care | Payer: Self-pay | Attending: Emergency Medicine | Admitting: Emergency Medicine

## 2016-03-11 DIAGNOSIS — J45909 Unspecified asthma, uncomplicated: Secondary | ICD-10-CM | POA: Insufficient documentation

## 2016-03-11 DIAGNOSIS — R109 Unspecified abdominal pain: Secondary | ICD-10-CM | POA: Insufficient documentation

## 2016-03-11 DIAGNOSIS — I10 Essential (primary) hypertension: Secondary | ICD-10-CM | POA: Insufficient documentation

## 2016-03-11 DIAGNOSIS — F17210 Nicotine dependence, cigarettes, uncomplicated: Secondary | ICD-10-CM | POA: Insufficient documentation

## 2016-03-11 DIAGNOSIS — G8929 Other chronic pain: Secondary | ICD-10-CM | POA: Insufficient documentation

## 2016-03-11 DIAGNOSIS — Z79899 Other long term (current) drug therapy: Secondary | ICD-10-CM | POA: Insufficient documentation

## 2016-03-11 LAB — CBC
HCT: 34.4 % — ABNORMAL LOW (ref 39.0–52.0)
Hemoglobin: 11.2 g/dL — ABNORMAL LOW (ref 13.0–17.0)
MCH: 30.2 pg (ref 26.0–34.0)
MCHC: 32.6 g/dL (ref 30.0–36.0)
MCV: 92.7 fL (ref 78.0–100.0)
PLATELETS: 212 10*3/uL (ref 150–400)
RBC: 3.71 MIL/uL — AB (ref 4.22–5.81)
RDW: 15 % (ref 11.5–15.5)
WBC: 11.8 10*3/uL — AB (ref 4.0–10.5)

## 2016-03-11 LAB — COMPREHENSIVE METABOLIC PANEL
ALBUMIN: 3.5 g/dL (ref 3.5–5.0)
ALT: 93 U/L — ABNORMAL HIGH (ref 17–63)
AST: 128 U/L — AB (ref 15–41)
Alkaline Phosphatase: 98 U/L (ref 38–126)
Anion gap: 7 (ref 5–15)
CHLORIDE: 107 mmol/L (ref 101–111)
CO2: 24 mmol/L (ref 22–32)
Calcium: 8.8 mg/dL — ABNORMAL LOW (ref 8.9–10.3)
Creatinine, Ser: 0.74 mg/dL (ref 0.61–1.24)
GFR calc Af Amer: >60 mL/min (ref >60)
GFR calc non Af Amer: >60 mL/min (ref >60)
GLUCOSE: 116 mg/dL — AB (ref 65–99)
POTASSIUM: 3.8 mmol/L (ref 3.5–5.1)
Sodium: 138 mmol/L (ref 135–145)
Total Bilirubin: 0.4 mg/dL (ref 0.3–1.2)
Total Protein: 6.8 g/dL (ref 6.5–8.1)

## 2016-03-11 LAB — LIPASE, BLOOD: LIPASE: 26 U/L (ref 11–51)

## 2016-03-11 MED ORDER — KETOROLAC TROMETHAMINE 60 MG/2ML IM SOLN
60.0000 mg | Freq: Once | INTRAMUSCULAR | Status: AC
  Administered 2016-03-11: 60 mg via INTRAMUSCULAR
  Filled 2016-03-11: qty 2

## 2016-03-11 MED ORDER — ONDANSETRON 4 MG PO TBDP
8.0000 mg | ORAL_TABLET | Freq: Once | ORAL | Status: AC
  Administered 2016-03-11: 8 mg via ORAL
  Filled 2016-03-11: qty 2

## 2016-03-11 MED ORDER — GI COCKTAIL ~~LOC~~
30.0000 mL | Freq: Once | ORAL | Status: AC
  Administered 2016-03-11: 30 mL via ORAL
  Filled 2016-03-11: qty 30

## 2016-03-11 NOTE — ED Provider Notes (Signed)
[facility] DEPT Provider Note   CSN: [id number] Arrival date & time: 03/11/16  0007  By signing my name below, I, [name], attest that this documentation has been prepared under the direction and in the presence of [name], MD. Electronically Signed: Doran Moran, ED Scribe. 03/11/16. 12:55 AM.  History   Chief Complaint Chief Complaint  Patient presents with  . Abdominal Pain   The history is provided by the patient. No language interpreter was used.   HPI Comments: [name] is a 45 y.o. male who presents to the Emergency Department complaining of intermittent sharp left-sided abdominal pain that began in the last 24 hours. Pt also reports nausea and 3 episode diarrhea. Pt denies any fevers, chills, CP, SOB, vomiting, or any other symptoms at this time. Symptoms are moderate in severity  Past Medical History:  Diagnosis Date  . Alcoholism /alcohol abuse ([place])   . Anemia   . Anxiety   . Arthritis    "knees; arms; elbows" (03/26/2015)  . Asthma   . Bipolar disorder ([place])   . Chronic bronchitis ([place])   . Chronic lower back pain   . Cocaine abuse   . Depression   . Family history of adverse reaction to anesthesia    "grandmother gets confused"  . Femoral condyle fracture ([place]) 03/08/2014   left medial/notes 03/09/2014  . GERD (gastroesophageal reflux disease)   . H/O hiatal hernia   . H/O suicide attempt 10/2012  . Heart murmur    "when he was little" (03/06/2013)  . High cholesterol   . History of blood transfusion 10/2012   "when I tried to commit suicide"  . History of stomach ulcers   . Hypertension   . Marijuana abuse, continuous   . Migraine    "a few times/year" (03/26/2015)  . Pancreatitis   . Pneumonia 1990's X 3  . PTSD (post-traumatic stress disorder)   . Shortness of breath    "can happen at anytime" (03/06/2013)  . Sickle cell trait ([place])   . WPW (Wolff-Parkinson-White syndrome)    [name] 03/06/2013    Patient Active Problem  List   Diagnosis Date Noted  . Leukocytosis   . Hospital acquired PNA 05/20/2015  . Pancreatitis 05/18/2015  . Pseudocyst of pancreas 05/18/2015  . Malnutrition of moderate degree 03/27/2015  . Abnormal transaminases 03/26/2015  . Polysubstance abuse (tobacco, cocaine, THC, and ETOH) 03/26/2015  . Acute pancreatitis 03/26/2015  . Alcohol-induced chronic pancreatitis ([place])   . Essential hypertension 02/06/2014  . Alcohol-induced acute pancreatitis 11/28/2013  . Pancreatic pseudocyst/cyst 11/25/2013  . Alcohol dependence ([place]) 10/23/2013  . MDD (major depressive disorder), recurrent severe, without psychosis ([place]) 10/22/2013  . Protein-calorie malnutrition, severe ([place]) 10/10/2013  . Suicide attempt 10/08/2013  . Overdose 10/06/2013  . 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 TOBACCO ABUSE 03/23/2007    Past Surgical History:  Procedure Laterality Date  . CARDIAC CATHETERIZATION    . EYE SURGERY Left 1990's   "result of trauma"   . FACIAL FRACTURE SURGERY Left 1990's   "result of trauma"   . FRACTURE SURGERY    . HERNIA REPAIR    . LEFT HEART CATHETERIZATION WITH CORONARY ANGIOGRAM Right 03/07/2013   Procedure: LEFT HEART CATHETERIZATION WITH CORONARY ANGIOGRAM;  Surgeon: [name], MD;  Location: [facility] CATH [facility];  Service: Cardiovascular;  Laterality: Right;  . UMBILICAL HERNIA REPAIR       Home Medications    Prior to Admission medications  Medication Sig Start Date End Date Taking? Authorizing Provider  albuterol (PROVENTIL HFA;VENTOLIN HFA) 108 (90 BASE) MCG/ACT inhaler Inhale 2 puffs into the lungs every 6 (six) hours as needed for wheezing or shortness of breath (wheezing).     Historical Provider, MD  cholestyramine (QUESTRAN) 4 g packet Take 1 packet (4 g total) by mouth 3 (three) times daily as needed (diarrhea). [id number]   Delora Fuel, MD  clindamycin (CLEOCIN) 300 MG capsule Take 1 capsule (300 mg total) by mouth 3 (three) times  daily. 02/21/16   Kristen N Ward, DO  diphenoxylate-atropine (LOMOTIL) 2.5-0.025 MG tablet Take 1 tablet by mouth 4 (four) times daily as needed for diarrhea or loose stools. 12/04/15   [name], PA-C  fluticasone (FLONASE) 50 MCG/ACT nasal spray Place 1 spray into both nostrils daily as needed for allergies.     Historical Provider, MD  lipase/protease/amylase (CREON) 12000 UNITS CPEP capsule Take 2 capsules (24,000 Units total) by mouth 3 (three) times daily with meals. 01/15/15   [name], MD  metoprolol tartrate (LOPRESSOR) 25 MG tablet Take 1 tablet (25 mg total) by mouth 2 (two) times daily. 12/14/15   Tiffany Daneil Dan, PA-C  omeprazole (PRILOSEC) 20 MG capsule Take 1 capsule (20 mg total) by mouth daily. [id number]   Delora Fuel, MD  ondansetron (ZOFRAN ODT) 8 MG disintegrating tablet Take 1 tablet (8 mg total) by mouth every 8 (eight) hours as needed for nausea or vomiting. 01/25/16   [name], MD  QUEtiapine (SEROQUEL) 50 MG tablet Take 100 mg by mouth at bedtime. 01/15/15   Historical Provider, MD  sertraline (ZOLOFT) 25 MG tablet Take 75 mg by mouth daily.    Historical Provider, MD  sildenafil (VIAGRA) 50 MG tablet Take 1 tablet (50 mg total) by mouth daily as needed for erectile dysfunction. 12/14/15   Brayton Caves, PA-C  sucralfate (CARAFATE) 1 g tablet Take 1 tablet (1 g total) by mouth 4 (four) times daily -  with meals and at bedtime. 11/15/15   Christmas Lions, PA-C   Family History Family History  Problem Relation Age of Onset  . Hypertension Other   . Coronary artery disease Other    Social History Social History  Substance Use Topics  . Smoking status: Current Every Day Smoker    Packs/day: 1.00    Years: 30.00    Types: Cigarettes  . Smokeless tobacco: Current User    Types: Chew  . Alcohol use 7.2 oz/week    12 Cans of beer per week   Allergies   Shellfish-derived products; Trazodone and nefazodone; and Reglan [metoclopramide]   Review of  Systems Review of Systems A complete 10 system review of systems was obtained and all systems are negative except as noted in the HPI and PMH.   Physical Exam Updated Vital Signs BP 137/96 (BP Location: Left Arm)   Pulse 72   Temp 98.1 F (36.7 C) (Oral)   Resp 17   Ht 5\' 9"  (1.753 m)   Wt 130 lb (59 kg)   SpO2 100%   BMI 19.20 kg/m   Physical Exam  Constitutional: He is oriented to person, place, and time. He appears well-developed and well-nourished.  HENT:  Head: Normocephalic and atraumatic.  Eyes: EOM are normal.  Neck: Normal range of motion.  Cardiovascular: Normal rate, regular rhythm, normal heart sounds and intact distal pulses.   Pulmonary/Chest: Effort normal and breath sounds normal. No respiratory distress.  Abdominal: Soft. He exhibits  no distension. There is no tenderness.  Musculoskeletal: Normal range of motion.  Neurological: He is alert and oriented to person, place, and time.  Skin: Skin is warm and dry.  Psychiatric: He has a normal mood and affect. Judgment normal.  Nursing note and vitals reviewed.   ED Treatments / Results   DIAGNOSTIC STUDIES: Oxygen Saturation is 100% on room air, normal by my interpretation.    COORDINATION OF CARE: 12:55 AM Discussed treatment plan with pt at bedside and pt agreed to plan.  Labs (all labs ordered are listed, but only abnormal results are displayed) Labs Reviewed  CBC - Abnormal; Notable for the following:       Result Value   WBC 11.8 (*)    RBC 3.71 (*)    Hemoglobin 11.2 (*)    HCT 34.4 (*)    All other components within normal limits  COMPREHENSIVE METABOLIC PANEL - Abnormal; Notable for the following:    Glucose, Bld 116 (*)    BUN <5 (*)    Calcium 8.8 (*)    AST 128 (*)    ALT 93 (*)    All other components within normal limits  LIPASE, BLOOD    EKG  EKG Interpretation None       Radiology No results found.  Procedures Procedures (including critical care time)  Medications  Ordered in ED Medications  ondansetron (ZOFRAN-ODT) disintegrating tablet 8 mg (8 mg Oral Given 03/11/16 0241)  gi cocktail (Maalox,Lidocaine,Donnatal) (30 mLs Oral Given 03/11/16 0241)  ketorolac (TORADOL) injection 60 mg (60 mg Intramuscular Given 03/11/16 0244)     Initial Impression / Assessment and Plan / ED Course  I have reviewed the triage vital signs and the nursing notes.  Pertinent labs & imaging results that were available during my care of the patient were reviewed by me and considered in my medical decision making (see chart for details).  Clinical Course    Patient is overall well-appearing.  Patient is well known to myself and this emergency department.  This appears to be an exacerbation of her current/chronic abdominal pain.  Labs without significant abnormality today.  Abdominal exam is reassuring.  Vital signs are normal.  Discharge home in good condition.  Primary care follow-up  Final Clinical Impressions(s) / ED Diagnoses   Final diagnoses:  Chronic abdominal pain    New Prescriptions New Prescriptions   No medications on file   I personally performed the services described in this documentation, which was scribed in my presence. The recorded information has been reviewed and is accurate.        [name], MD 03/11/16 [phone]

## 2016-03-11 NOTE — ED Triage Notes (Signed)
Pt presents to ED for assessment of chronic abdominal pain/pancreatitis.  Pt denies any SOB, CP, denies n/v/d.  Pt also sts swelling to left jaw is returning.  Pt recently treated for cellulitis.

## 2016-03-28 IMAGING — CR DG CHEST 1V PORT
1 series · 1 of 1 positions shown · non-contrast
Comparison: 08/16/2013

CLINICAL DATA: Drug overdose

EXAM:
PORTABLE CHEST - 1 VIEW

[AP]
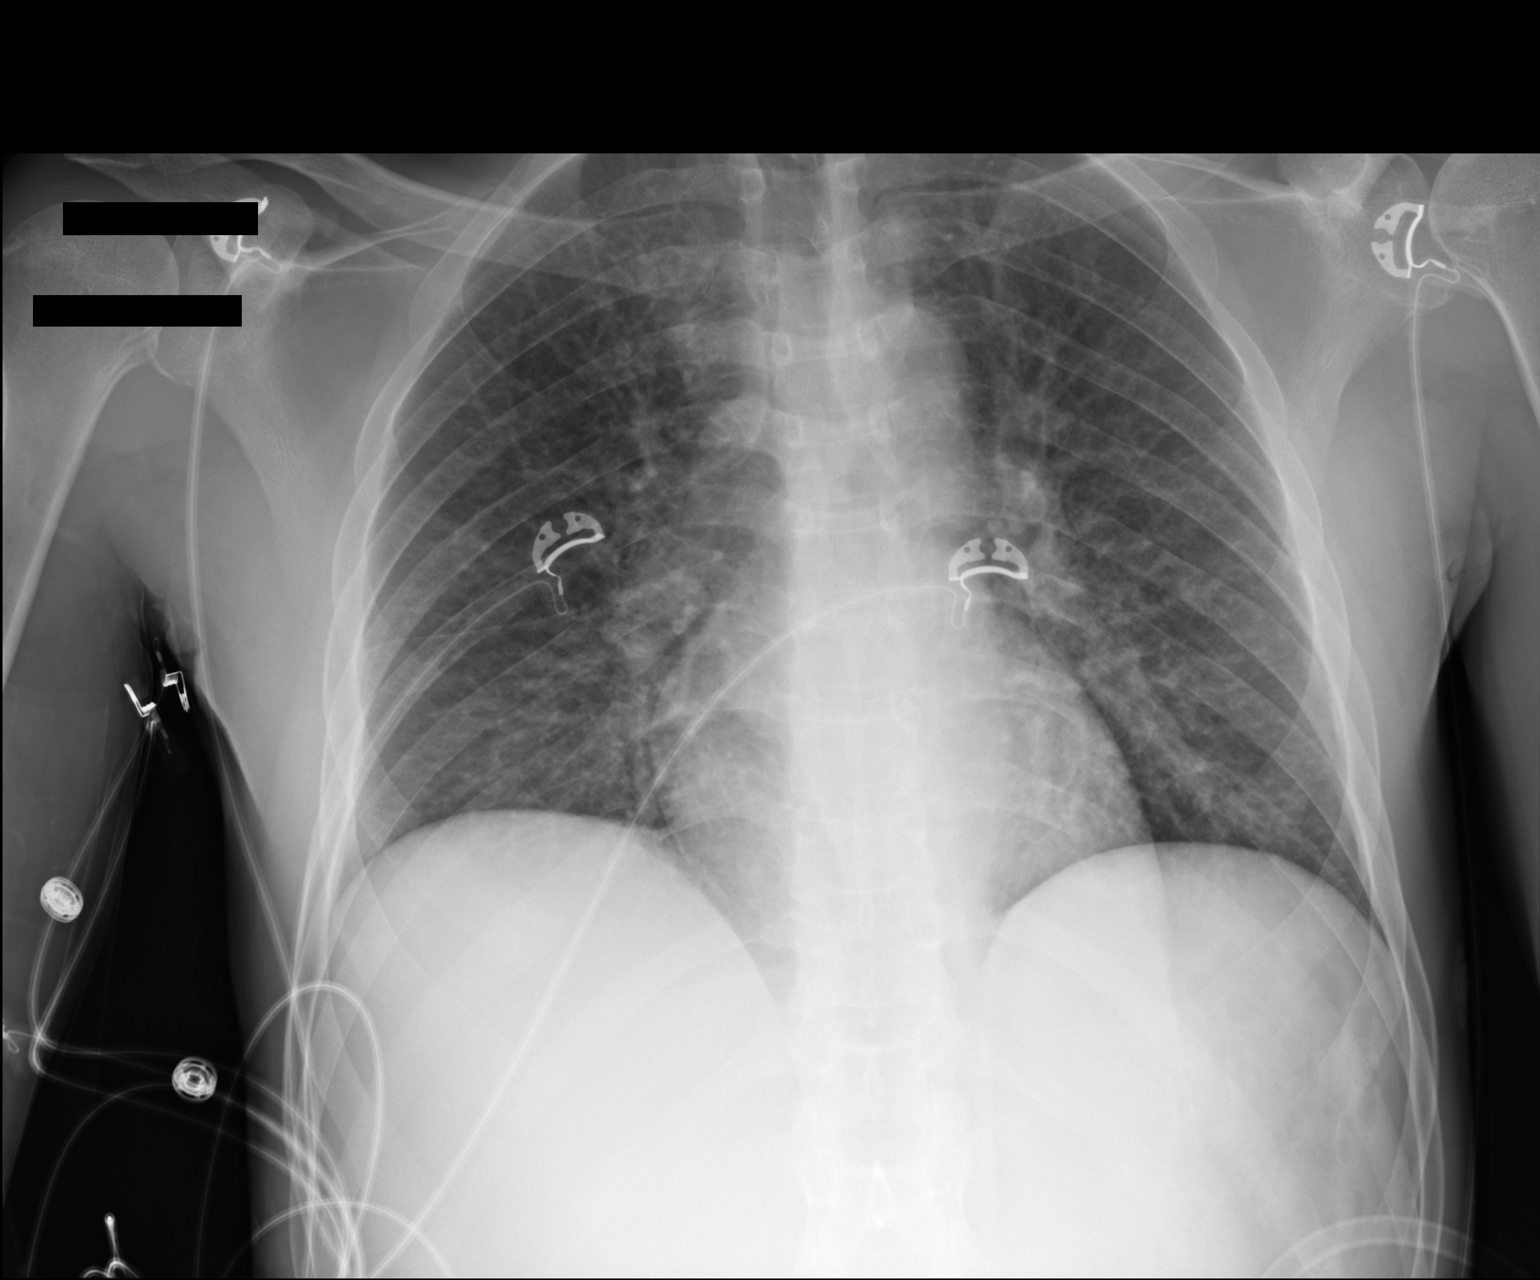

[1 of 1 positions shown; findings below may reference images not displayed]

FINDINGS: There is interstitial coarsening and mild hypoaeration. Normal heart
size and mediastinal contours. No effusion or pneumothorax.
IMPRESSION: Diffuse interstitial prominence, likely from hypoaeration. Cannot
exclude mild aspiration pneumonitis.

## 2016-03-28 IMAGING — CR DG ABD PORTABLE 1V
1 series · 1 of 1 positions shown · non-contrast
Comparison: CT Abdomen and Pelvis 4 Htx 9065.

CLINICAL DATA: 43-year-old male drug overdose. Initial encounter.

EXAM:
PORTABLE ABDOMEN - 1 VIEW

[AP]
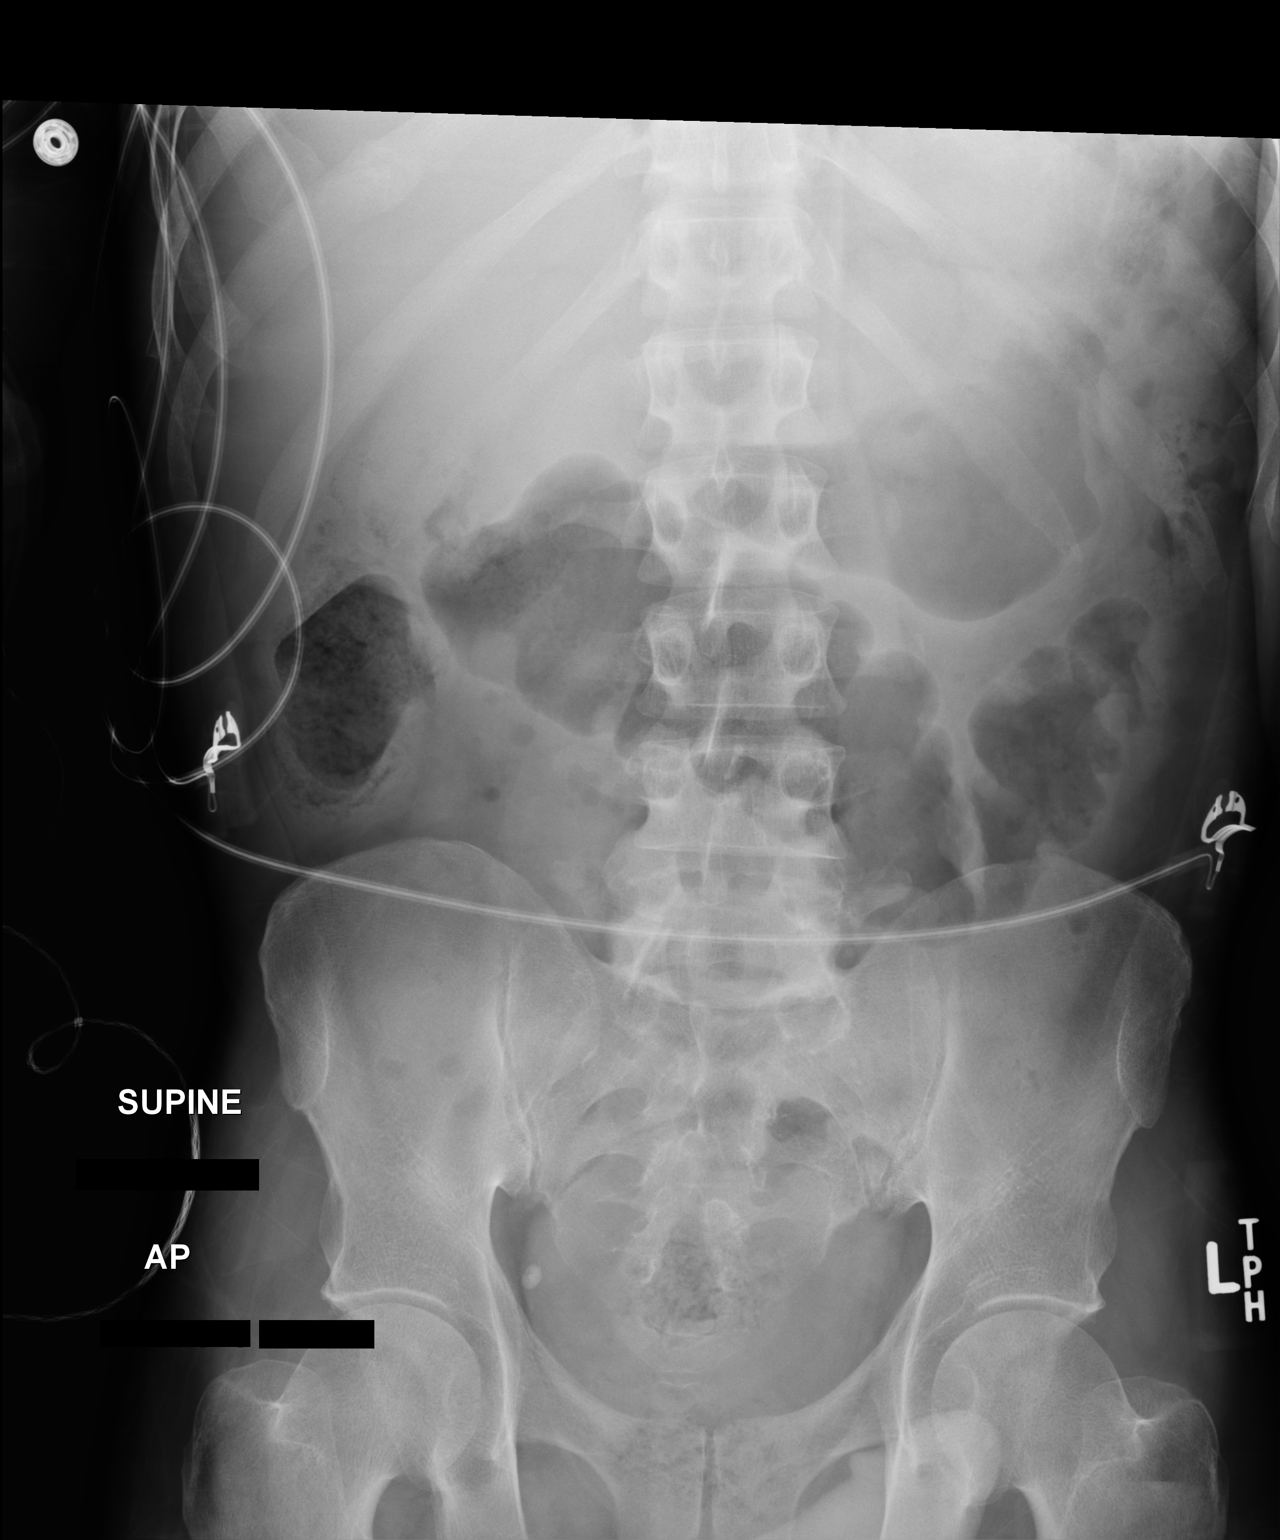

[1 of 1 positions shown; findings below may reference images not displayed]

FINDINGS: Portable AP supine view at 0000 hrs. Non obstructed bowel gas
pattern. Stable right hemipelvis phlebolith. Evidence of a healed
posterior right tenth rib fracture. No acute osseous abnormality
identified. No definite pneumoperitoneum on this supine view.
IMPRESSION: Non obstructed bowel gas pattern.

## 2016-03-30 IMAGING — CR DG CHEST 1V PORT
1 series · 1 of 1 positions shown · non-contrast
Comparison: 10/06/2013.

CLINICAL DATA: Assess infiltrates.

EXAM:
PORTABLE CHEST - 1 VIEW

[portable]
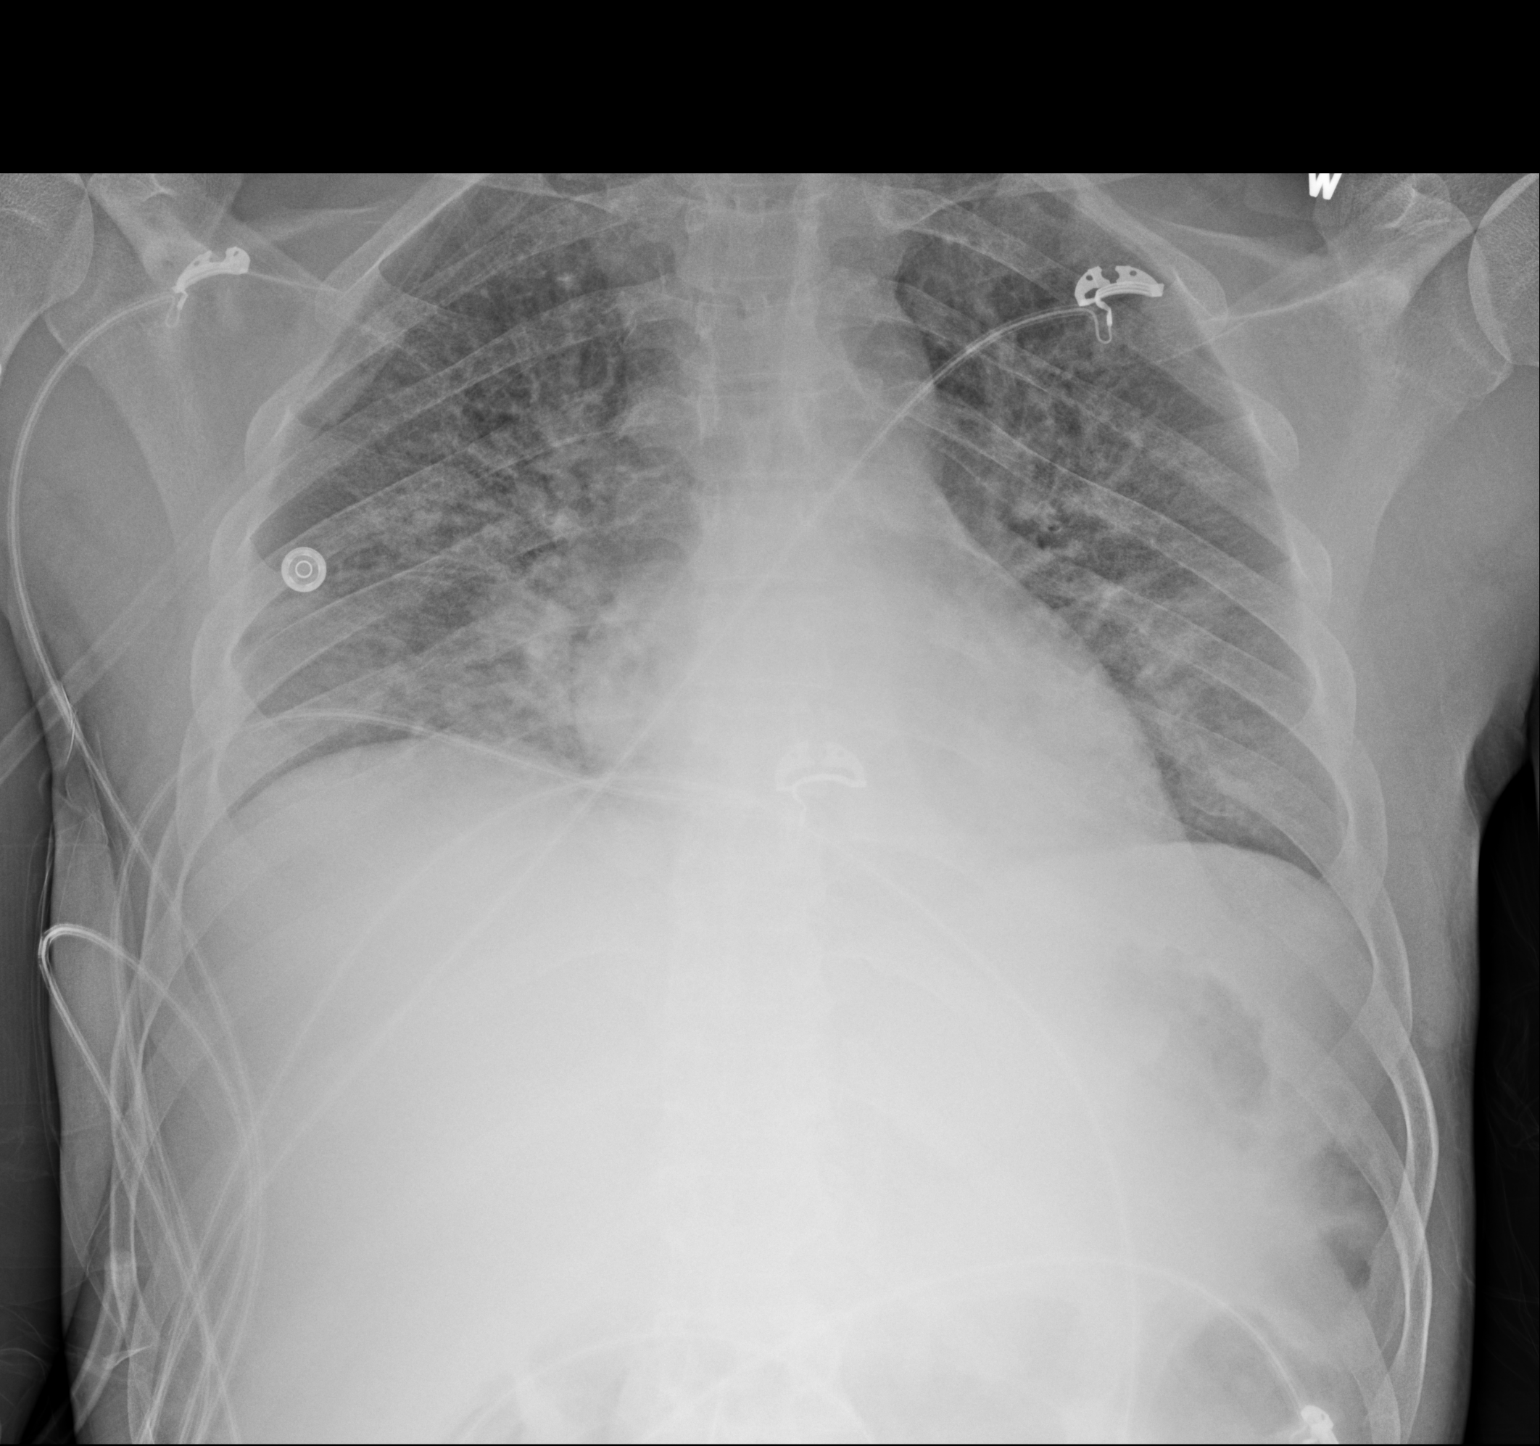

[1 of 1 positions shown; findings below may reference images not displayed]

FINDINGS: Bilateral irregular interstitial thickening appears increased from
the prior study, although this may be in part due to lower lung
volumes and a more semi-erect positioning.

No pleural effusion. No pneumothorax. Cardiac silhouette is normal
in size. Normal mediastinal and hilar contours.
IMPRESSION: Increased interstitial thickening. Diffuse bilateral interstitial
infiltrates versus interstitial edema. Degree of interstitial
thickening has increased when compared the prior study.

## 2016-04-03 IMAGING — CR DG CHEST 1V PORT
1 series · 1 of 1 positions shown · non-contrast
Comparison: 10/08/2013

CLINICAL DATA: Pneumonia.

EXAM:
PORTABLE CHEST - 1 VIEW

[AP]
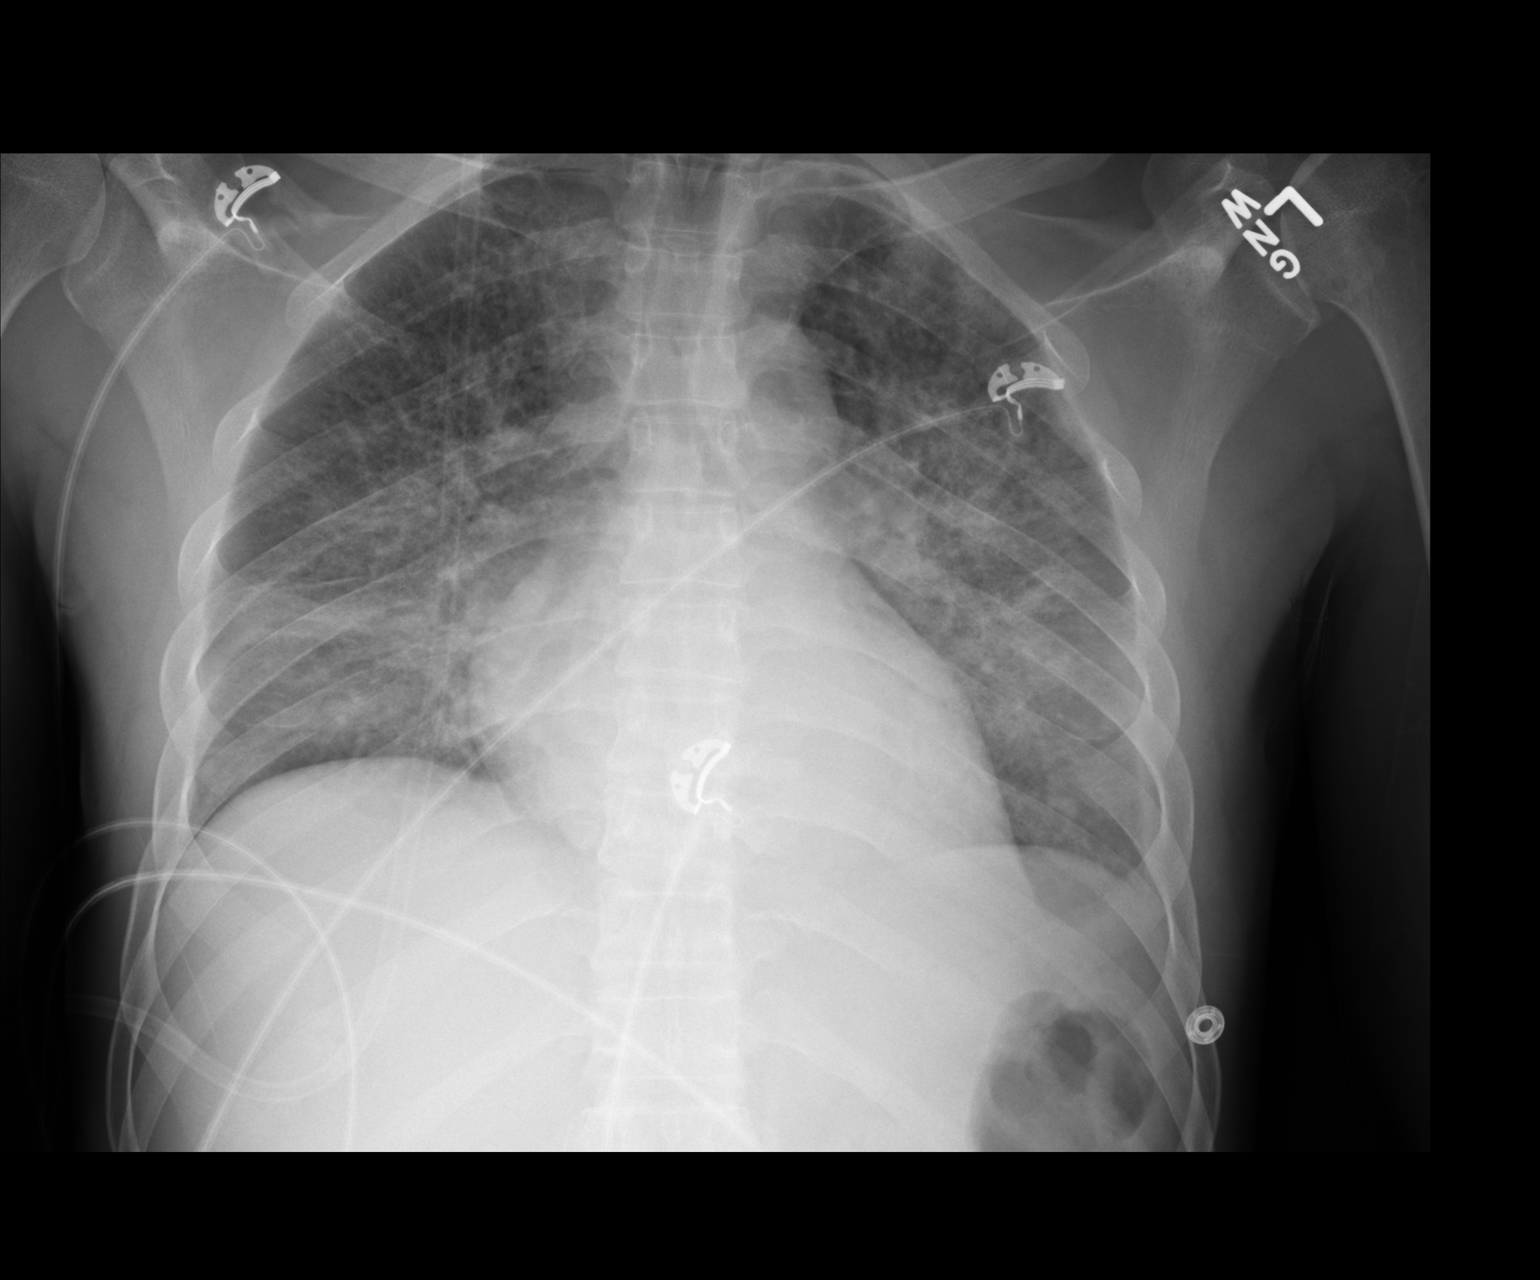

[1 of 1 positions shown; findings below may reference images not displayed]

FINDINGS: The cardiac silhouette, mediastinal and hilar contours are stable.
Persistent but improved bilateral interstitial and airspace process
likely atypical pneumonia. No pleural effusion or pneumothorax.
IMPRESSION: Persistent but improved bilateral interstitial and airspace process.

## 2016-04-05 IMAGING — CR DG ABD PORTABLE 1V
1 series · 1 of 1 positions shown · non-contrast
Comparison: 10/06/2013

CLINICAL DATA: Mid to left abdominal pain.

EXAM:
PORTABLE ABDOMEN - 1 VIEW

[AP]
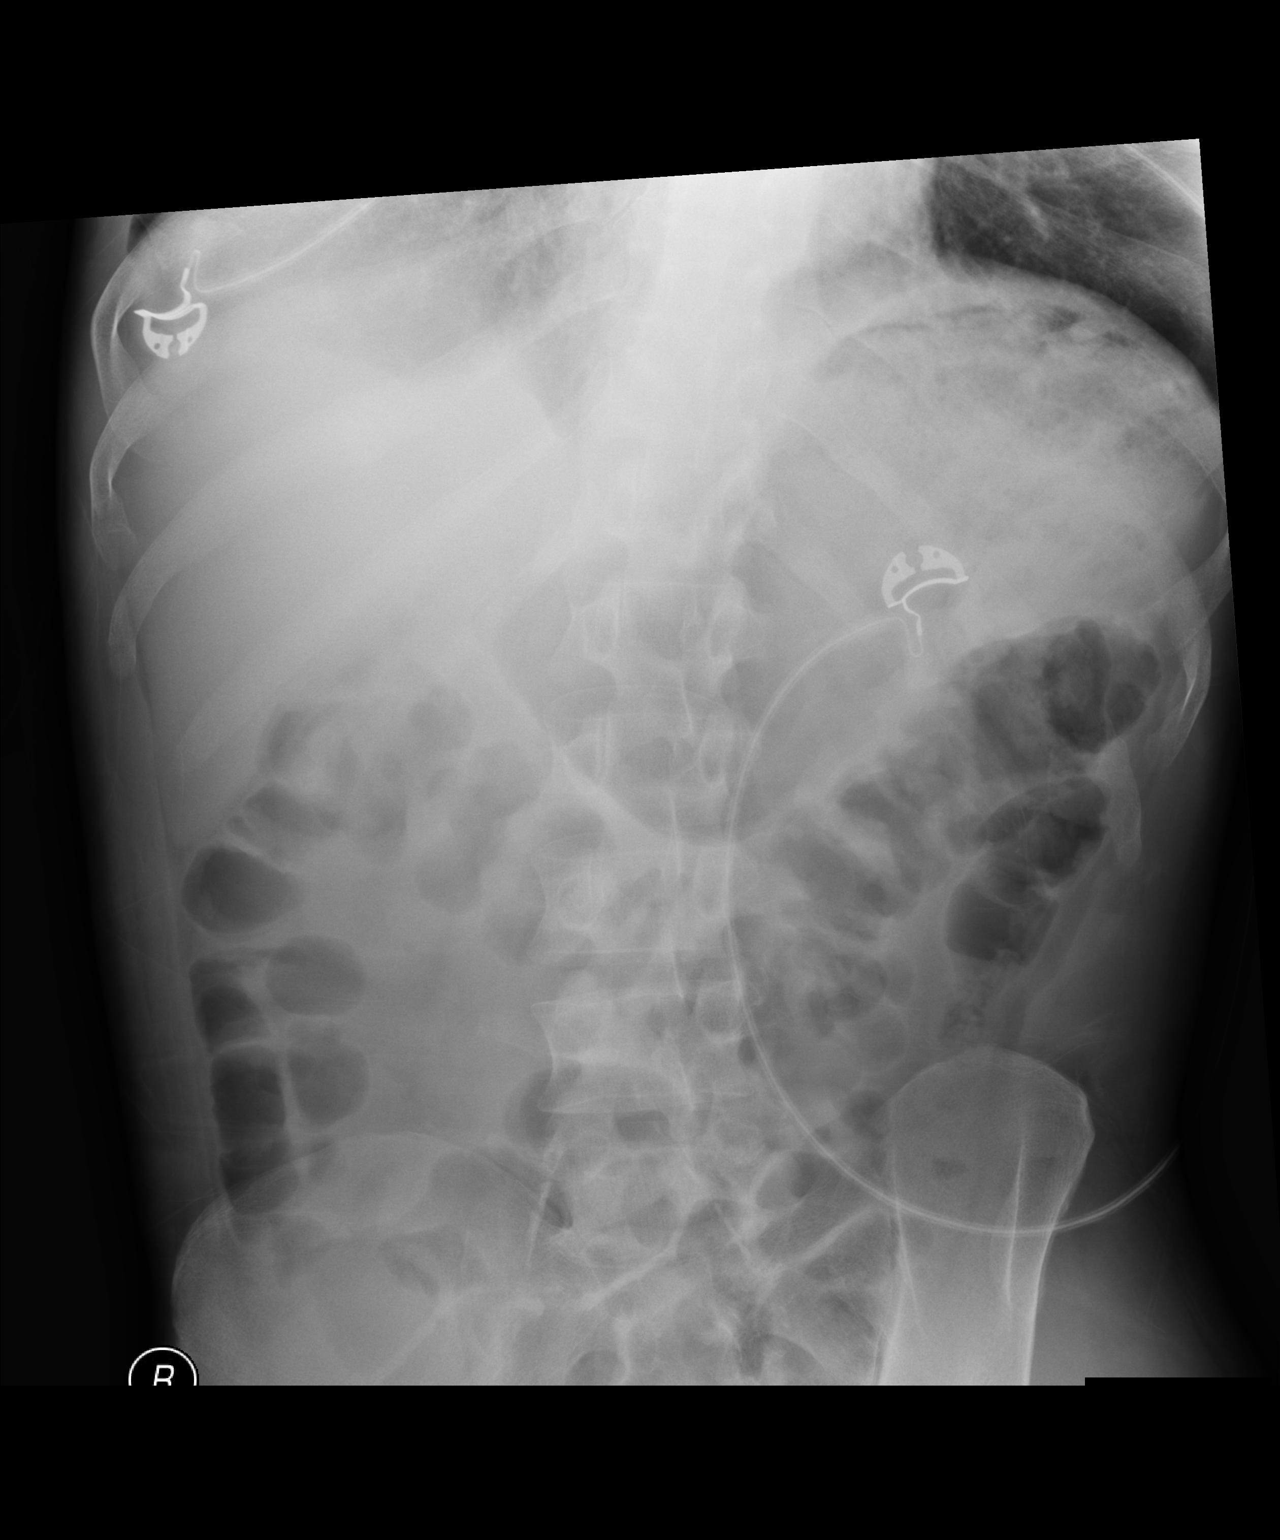

[1 of 1 positions shown; findings below may reference images not displayed]

FINDINGS: Bowel gas pattern is nonobstructive. No organomegaly or abnormal
calcifications identified. Visualized osseous structures have a
normal appearance. Patient is rotated.
IMPRESSION: No evidence for acute  abnormality.

## 2016-04-08 IMAGING — CR DG CHEST 2V
2 series · 2 of 2 positions shown · non-contrast
Comparison: 10/12/2013

CLINICAL DATA: Cough

EXAM:
CHEST  2 VIEW

[w chest pa]
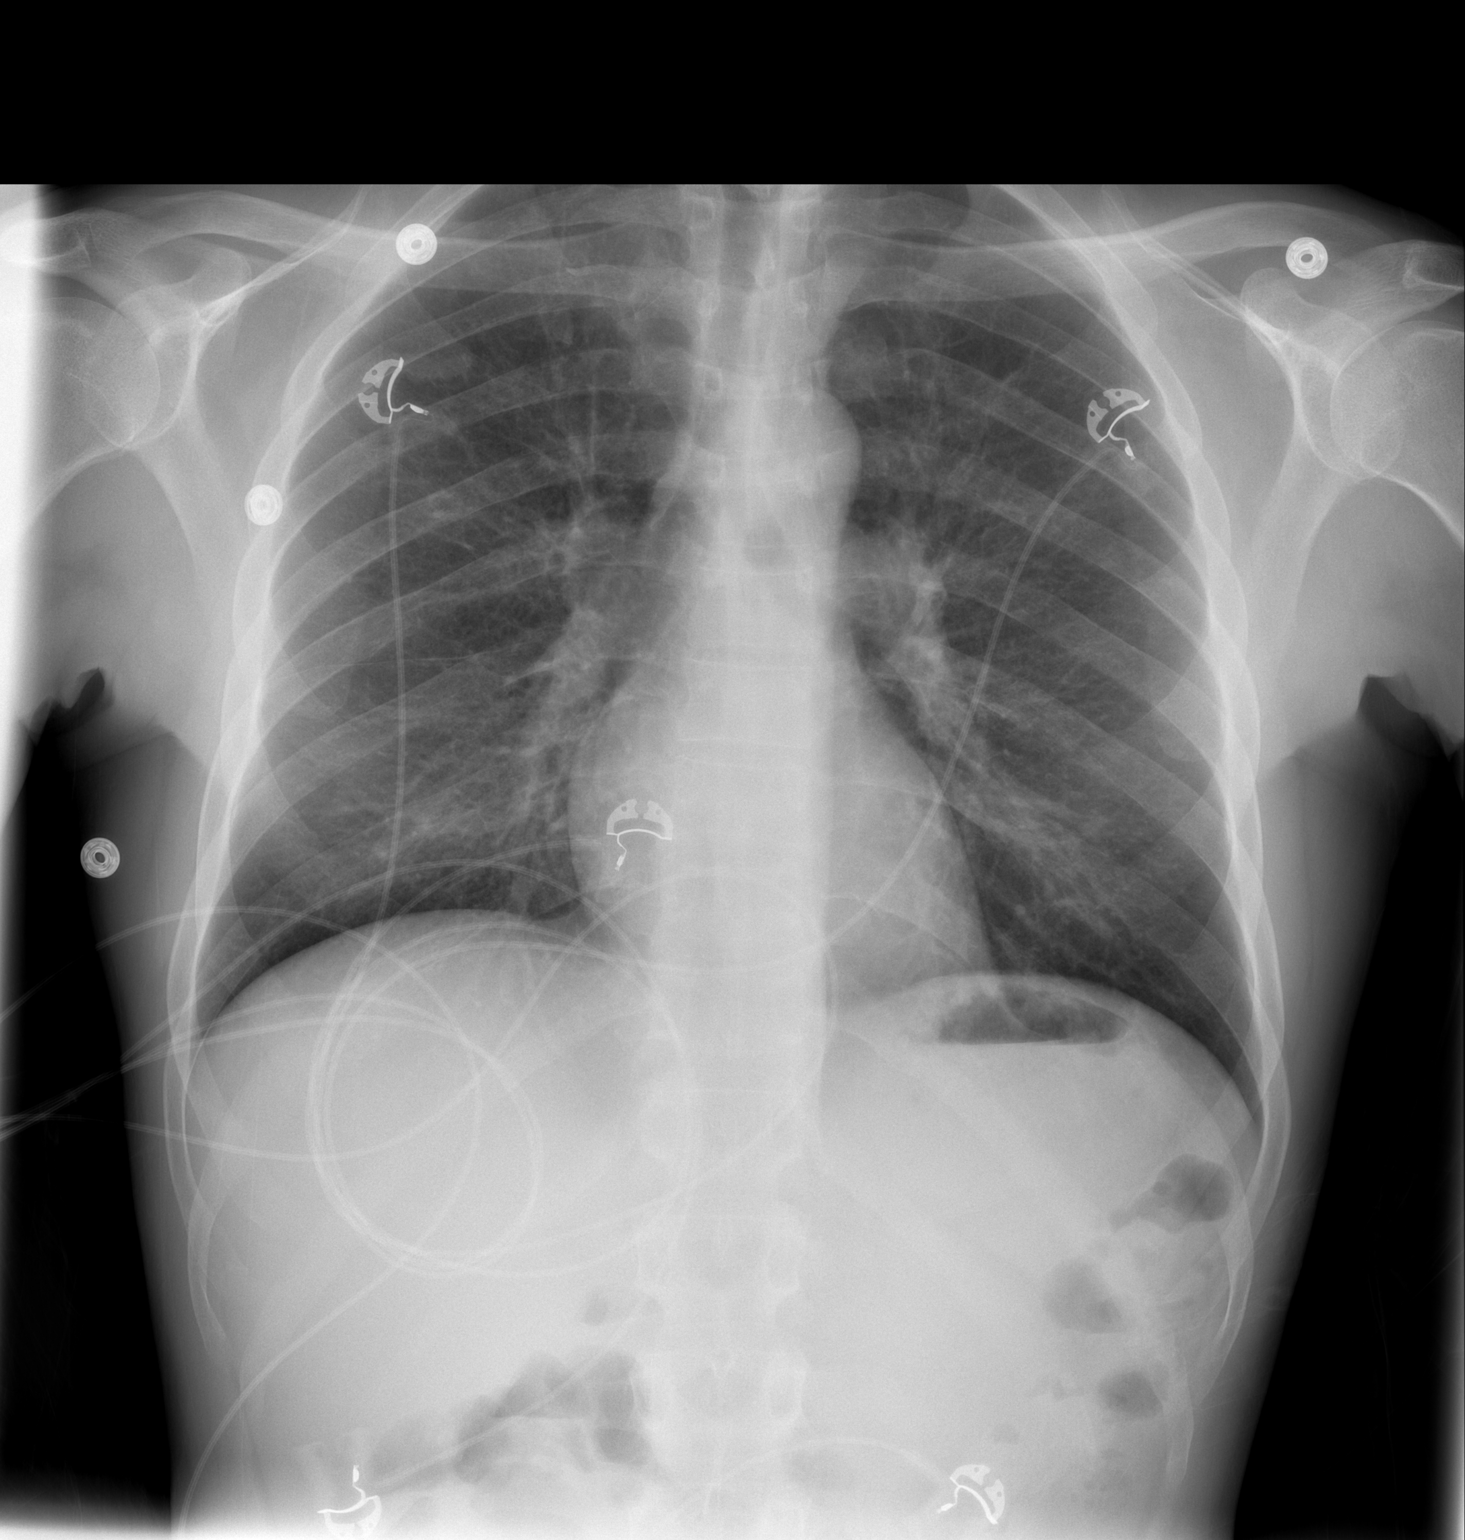

[w chest lat]
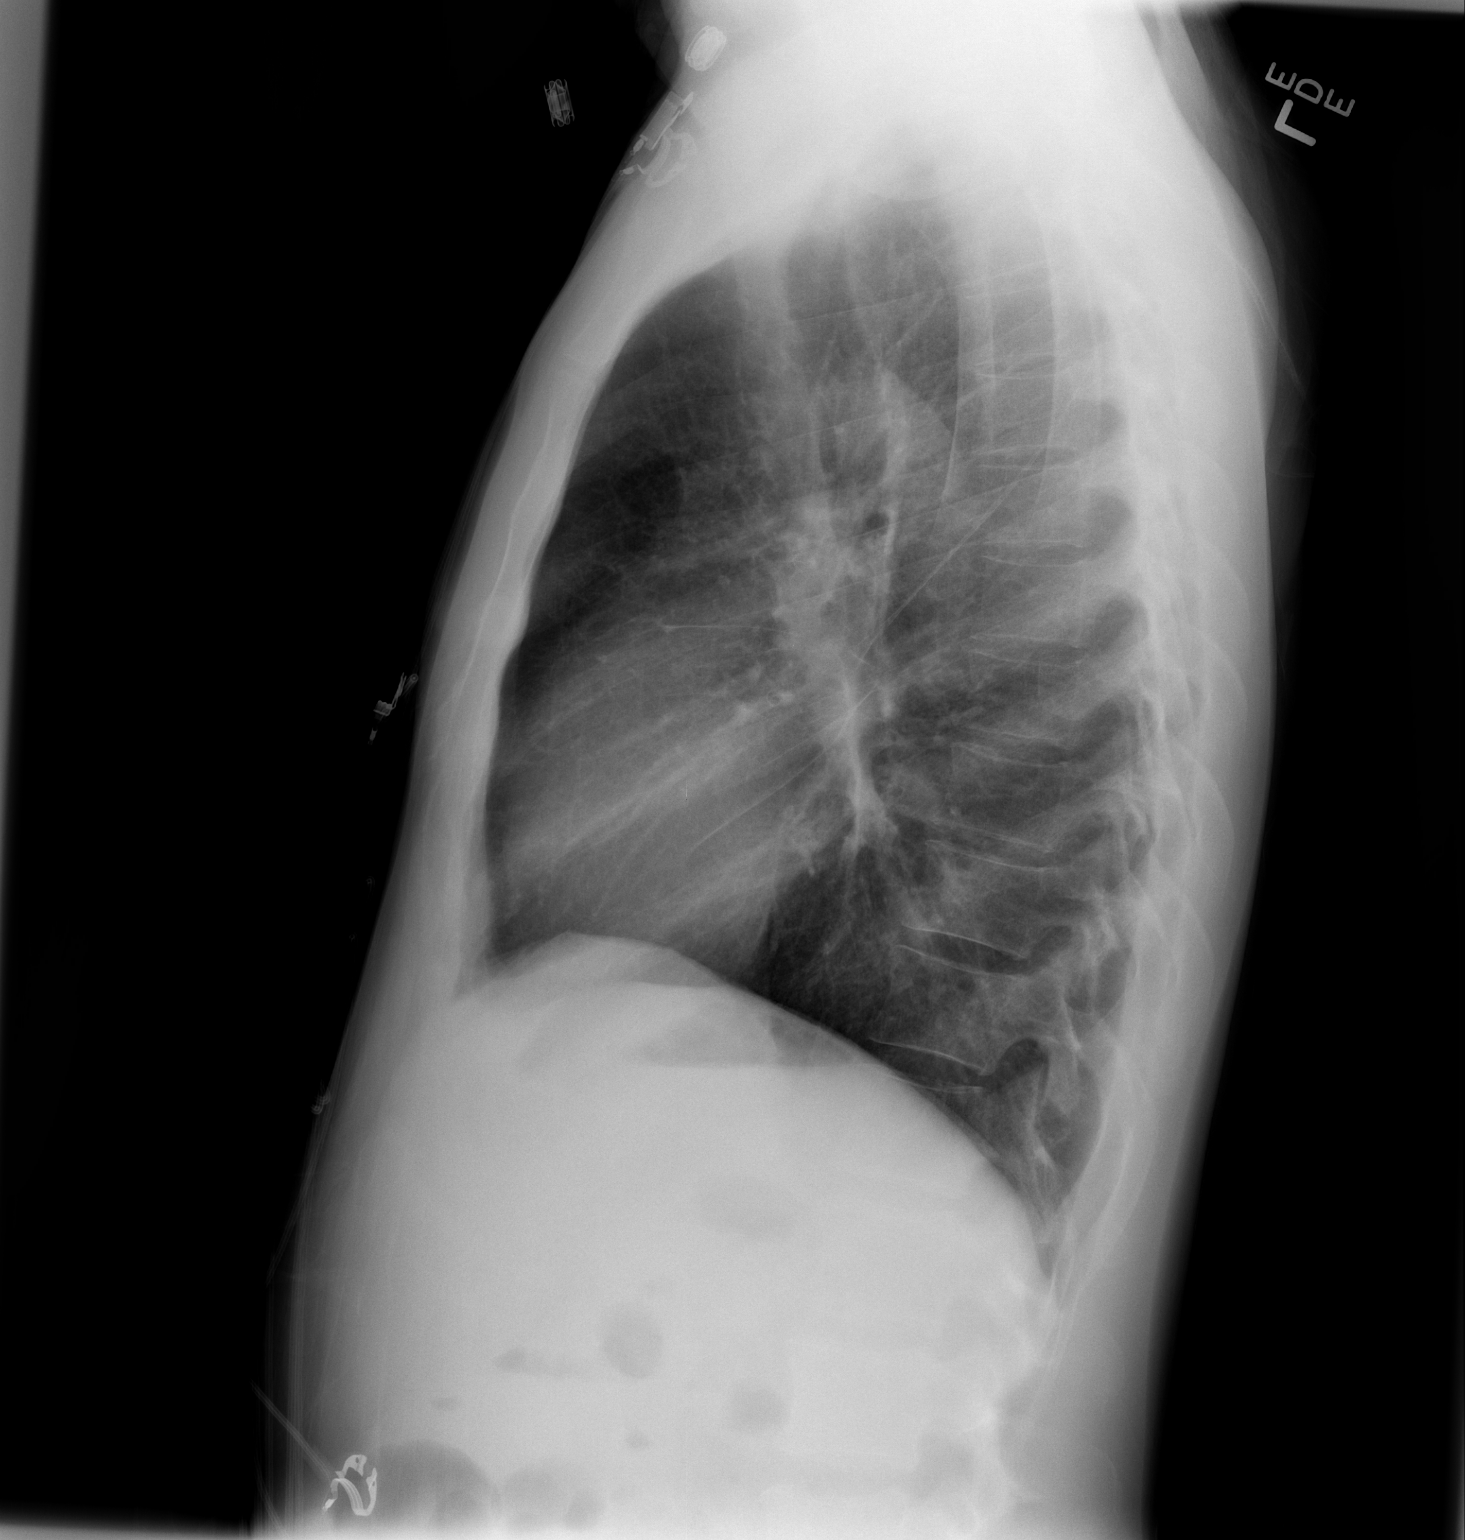

[2 of 2 positions shown; findings below may reference images not displayed]

FINDINGS: Cardiac silhouette is normal in size. Normal mediastinal and hilar
contours.

Lungs are clear. Previously seen interstitial airspace opacities
have resolved.

No pleural effusion or pneumothorax.

Bony thorax is intact.
IMPRESSION: No active cardiopulmonary disease.

## 2016-04-11 ENCOUNTER — Emergency Department (HOSPITAL_COMMUNITY)
Admission: EM | Admit: 2016-04-11 | Discharge: 2016-04-11 | Disposition: A | Discharge status: Home / Self Care | Payer: Self-pay | Attending: Emergency Medicine | Admitting: Emergency Medicine

## 2016-04-11 ENCOUNTER — Encounter (HOSPITAL_COMMUNITY): Payer: Self-pay | Admitting: Emergency Medicine

## 2016-04-11 DIAGNOSIS — Z79899 Other long term (current) drug therapy: Secondary | ICD-10-CM | POA: Insufficient documentation

## 2016-04-11 DIAGNOSIS — K861 Other chronic pancreatitis: Secondary | ICD-10-CM | POA: Insufficient documentation

## 2016-04-11 DIAGNOSIS — I10 Essential (primary) hypertension: Secondary | ICD-10-CM | POA: Insufficient documentation

## 2016-04-11 DIAGNOSIS — R109 Unspecified abdominal pain: Secondary | ICD-10-CM

## 2016-04-11 DIAGNOSIS — G8929 Other chronic pain: Secondary | ICD-10-CM

## 2016-04-11 DIAGNOSIS — J45909 Unspecified asthma, uncomplicated: Secondary | ICD-10-CM | POA: Insufficient documentation

## 2016-04-11 DIAGNOSIS — F17210 Nicotine dependence, cigarettes, uncomplicated: Secondary | ICD-10-CM | POA: Insufficient documentation

## 2016-04-11 LAB — COMPREHENSIVE METABOLIC PANEL
ALT: 42 U/L (ref 17–63)
AST: 75 U/L — AB (ref 15–41)
Albumin: 3.9 g/dL (ref 3.5–5.0)
Alkaline Phosphatase: 92 U/L (ref 38–126)
Anion gap: 8 (ref 5–15)
BILIRUBIN TOTAL: 0.4 mg/dL (ref 0.3–1.2)
BUN: 5 mg/dL — AB (ref 6–20)
CO2: 23 mmol/L (ref 22–32)
CREATININE: 0.69 mg/dL (ref 0.61–1.24)
Calcium: 8.8 mg/dL — ABNORMAL LOW (ref 8.9–10.3)
Chloride: 104 mmol/L (ref 101–111)
Glucose, Bld: 103 mg/dL — ABNORMAL HIGH (ref 65–99)
Potassium: 3.6 mmol/L (ref 3.5–5.1)
Sodium: 135 mmol/L (ref 135–145)
TOTAL PROTEIN: 7.4 g/dL (ref 6.5–8.1)

## 2016-04-11 LAB — CBC WITH DIFFERENTIAL/PLATELET
BASOS ABS: 0 10*3/uL (ref 0.0–0.1)
Basophils Relative: 0 %
EOS PCT: 5 %
Eosinophils Absolute: 0.4 10*3/uL (ref 0.0–0.7)
HEMATOCRIT: 34.8 % — AB (ref 39.0–52.0)
Hemoglobin: 11.6 g/dL — ABNORMAL LOW (ref 13.0–17.0)
LYMPHS ABS: 3.5 10*3/uL (ref 0.7–4.0)
LYMPHS PCT: 38 %
MCH: 29.7 pg (ref 26.0–34.0)
MCHC: 33.3 g/dL (ref 30.0–36.0)
MCV: 89.2 fL (ref 78.0–100.0)
MONO ABS: 0.8 10*3/uL (ref 0.1–1.0)
Monocytes Relative: 8 %
NEUTROS ABS: 4.5 10*3/uL (ref 1.7–7.7)
Neutrophils Relative %: 49 %
PLATELETS: 220 10*3/uL (ref 150–400)
RBC: 3.9 MIL/uL — AB (ref 4.22–5.81)
RDW: 13.9 % (ref 11.5–15.5)
WBC: 9.2 10*3/uL (ref 4.0–10.5)

## 2016-04-11 LAB — LIPASE, BLOOD: LIPASE: 37 U/L (ref 11–51)

## 2016-04-11 MED ORDER — ONDANSETRON HCL 4 MG/2ML IJ SOLN
4.0000 mg | Freq: Once | INTRAMUSCULAR | Status: AC
  Administered 2016-04-11: 4 mg via INTRAVENOUS
  Filled 2016-04-11: qty 2

## 2016-04-11 MED ORDER — SUCRALFATE 1 G PO TABS
1.0000 g | ORAL_TABLET | Freq: Once | ORAL | Status: AC
  Administered 2016-04-11: 1 g via ORAL
  Filled 2016-04-11: qty 1

## 2016-04-11 MED ORDER — HALOPERIDOL LACTATE 5 MG/ML IJ SOLN
2.0000 mg | Freq: Once | INTRAMUSCULAR | Status: AC
  Administered 2016-04-11: 2 mg via INTRAVENOUS
  Filled 2016-04-11: qty 1

## 2016-04-11 MED ORDER — PANTOPRAZOLE SODIUM 20 MG PO TBEC: 40.0000 mg | DELAYED_RELEASE_TABLET | Freq: Every day | ORAL | 0 refills | Status: DC

## 2016-04-11 MED ORDER — PANTOPRAZOLE SODIUM 40 MG IV SOLR
40.0000 mg | Freq: Once | INTRAVENOUS | Status: AC
Start: 2016-04-11 — End: 2016-04-11
  Administered 2016-04-11: 40 mg via INTRAVENOUS
  Filled 2016-04-11: qty 40

## 2016-04-11 MED ORDER — SODIUM CHLORIDE 0.9 % IV BOLUS (SEPSIS)
1000.0000 mL | Freq: Once | INTRAVENOUS | Status: AC
  Administered 2016-04-11: 1000 mL via INTRAVENOUS

## 2016-04-11 NOTE — ED Notes (Signed)
Patient brought in by [facility] for c/o N/V/D since [place].  Took his Oxy and Seroquel without relief.  BP 156/100, P 90 CBG 96.  Ambulatory from the ambulance

## 2016-04-11 NOTE — Discharge Instructions (Signed)
Continue your PPI, Carafate, and Zofran at home.

## 2016-04-11 NOTE — ED Provider Notes (Signed)
[facility] Provider Note   CSN: [id number] Arrival date & time: 04/11/16  [id number]     History   Chief Complaint Chief Complaint  Patient presents with  . Pancreatitis    HPI HICKMAN EMSWILER is a 45 y.o. male.  HPI  This 45 year old male with history of chronic pancreatitis who presents with abdominal pain. Patient reports abdominal pain, nausea, and diarrhea. Reports onset of symptoms at 10 PM last night. He reports that his abdominal pain is sharp and epigastric. It radiates to his back. It is currently 8 out of 10. It is consistent with prior pancreatitis flares. He reports that he has not had an alcoholic drink in 3 days. He took his oxycodone, Seroquel, Carafate, Zofran at home with minimal relief. He reports nausea without vomiting. He does endorse diarrhea. Denies any chest pain, shortness breath, fevers.  Past Medical History:  Diagnosis Date  . Alcoholism /alcohol abuse ([place])   . Anemia   . Anxiety   . Arthritis    "knees; arms; elbows" (03/26/2015)  . Asthma   . Bipolar disorder ([place])   . Chronic bronchitis ([place])   . Chronic lower back pain   . Cocaine abuse   . Depression   . Family history of adverse reaction to anesthesia    "grandmother gets confused"  . Femoral condyle fracture ([place]) 03/08/2014   left medial/notes 03/09/2014  . GERD (gastroesophageal reflux disease)   . H/O hiatal hernia   . H/O suicide attempt 10/2012  . Heart murmur    "when he was little" (03/06/2013)  . High cholesterol   . History of blood transfusion 10/2012   "when I tried to commit suicide"  . History of stomach ulcers   . Hypertension   . Marijuana abuse, continuous   . Migraine    "a few times/year" (03/26/2015)  . Pancreatitis   . Pneumonia 1990's X 3  . PTSD (post-traumatic stress disorder)   . Shortness of breath    "can happen at anytime" (03/06/2013)  . Sickle cell trait ([place])   . WPW (Wolff-Parkinson-White syndrome)    [name] 03/06/2013    Patient Active  Problem List   Diagnosis Date Noted  . Leukocytosis   . Hospital acquired PNA 05/20/2015  . Pancreatitis 05/18/2015  . Pseudocyst of pancreas 05/18/2015  . Malnutrition of moderate degree 03/27/2015  . Abnormal transaminases 03/26/2015  . Polysubstance abuse (tobacco, cocaine, THC, and ETOH) 03/26/2015  . Acute pancreatitis 03/26/2015  . Alcohol-induced chronic pancreatitis ([place])   . Essential hypertension 02/06/2014  . Alcohol-induced acute pancreatitis 11/28/2013  . Pancreatic pseudocyst/cyst 11/25/2013  . Alcohol dependence ([place]) 10/23/2013  . MDD (major depressive disorder), recurrent severe, without psychosis ([place]) 10/22/2013  . Protein-calorie malnutrition, severe ([place]) 10/10/2013  . Suicide attempt 10/08/2013  . Overdose 10/06/2013  . 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 TOBACCO ABUSE 03/23/2007    Past Surgical History:  Procedure Laterality Date  . CARDIAC CATHETERIZATION    . EYE SURGERY Left 1990's   "result of trauma"   . FACIAL FRACTURE SURGERY Left 1990's   "result of trauma"   . FRACTURE SURGERY    . HERNIA REPAIR    . LEFT HEART CATHETERIZATION WITH CORONARY ANGIOGRAM Right 03/07/2013   Procedure: LEFT HEART CATHETERIZATION WITH CORONARY ANGIOGRAM;  Surgeon: [name], MD;  Location: [facility] CATH [facility];  Service: Cardiovascular;  Laterality: Right;  . UMBILICAL HERNIA REPAIR         Home Medications  Prior to Admission medications   Medication Sig Start Date End Date Taking? Authorizing Provider  albuterol (PROVENTIL HFA;VENTOLIN HFA) 108 (90 BASE) MCG/ACT inhaler Inhale 2 puffs into the lungs every 6 (six) hours as needed for wheezing or shortness of breath (wheezing).    Yes Historical Provider, MD  cholestyramine (QUESTRAN) 4 g packet Take 1 packet (4 g total) by mouth 3 (three) times daily as needed (diarrhea). [id number]  Yes Delora Fuel, MD  diphenoxylate-atropine (LOMOTIL) 2.5-0.025 MG tablet Take 1 tablet by mouth 4  (four) times daily as needed for diarrhea or loose stools. 12/04/15  Yes [name], PA-C  fluticasone (FLONASE) 50 MCG/ACT nasal spray Place 1 spray into both nostrils daily as needed for allergies.    Yes Historical Provider, MD  lipase/protease/amylase (CREON) 12000 UNITS CPEP capsule Take 2 capsules (24,000 Units total) by mouth 3 (three) times daily with meals. 01/15/15  Yes [name], MD  metoprolol tartrate (LOPRESSOR) 25 MG tablet Take 1 tablet (25 mg total) by mouth 2 (two) times daily. 12/14/15  Yes Tiffany Daneil Dan, PA-C  omeprazole (PRILOSEC) 20 MG capsule Take 1 capsule (20 mg total) by mouth daily. [id number]  Yes Delora Fuel, MD  ondansetron (ZOFRAN ODT) 8 MG disintegrating tablet Take 1 tablet (8 mg total) by mouth every 8 (eight) hours as needed for nausea or vomiting. 01/25/16  Yes [name], MD  QUEtiapine (SEROQUEL) 50 MG tablet Take 100 mg by mouth at bedtime. 01/15/15  Yes Historical Provider, MD  sertraline (ZOLOFT) 25 MG tablet Take 75 mg by mouth daily.   Yes Historical Provider, MD  sildenafil (VIAGRA) 50 MG tablet Take 1 tablet (50 mg total) by mouth daily as needed for erectile dysfunction. 12/14/15  Yes Tiffany Daneil Dan, PA-C  sucralfate (CARAFATE) 1 g tablet Take 1 tablet (1 g total) by mouth 4 (four) times daily -  with meals and at bedtime. 11/15/15  Yes Cordry [place], PA-C  clindamycin (CLEOCIN) 300 MG capsule Take 1 capsule (300 mg total) by mouth 3 (three) times daily. Patient not taking: Reported on 04/11/2016 02/21/16   Kristen N Ward, DO  pantoprazole (PROTONIX) 20 MG tablet Take 2 tablets (40 mg total) by mouth daily. 04/11/16   Merryl Hacker, MD    Family History Family History  Problem Relation Age of Onset  . Hypertension Other   . Coronary artery disease Other     Social History Social History  Substance Use Topics  . Smoking status: Current Every Day Smoker    Packs/day: 1.00    Years: 30.00    Types: Cigarettes  . Smokeless  tobacco: Current User    Types: Chew  . Alcohol use 7.2 oz/week    12 Cans of beer per week     Allergies   Shellfish-derived products; Trazodone and nefazodone; and Reglan [metoclopramide]   Review of Systems Review of Systems  Constitutional: Negative for fever.  Respiratory: Negative for shortness of breath.   Cardiovascular: Negative for chest pain.  Gastrointestinal: Positive for abdominal pain, diarrhea and nausea. Negative for vomiting.  Genitourinary: Negative for dysuria.  All other systems reviewed and are negative.    Physical Exam Updated Vital Signs BP 144/96   Pulse 80   Temp 98.7 F (37.1 C) (Oral)   Resp 12   SpO2 100%   Physical Exam  Constitutional: He is oriented to person, place, and time. He appears well-developed and well-nourished. No distress.  HENT:  Head: Normocephalic and atraumatic.  Mucous membranes moist  Cardiovascular: Normal rate, regular rhythm and normal heart sounds.   No murmur heard. Pulmonary/Chest: Effort normal and breath sounds normal. No respiratory distress. He has no wheezes.  Abdominal: Soft. Bowel sounds are normal. He exhibits no distension. There is no tenderness. There is no rebound and no guarding.  No objective tenderness to palpation on exam; however, patient endorses tenderness with palpation of the epigastrium  Musculoskeletal: He exhibits no edema.  Neurological: He is alert and oriented to person, place, and time.  Skin: Skin is warm and dry.  Psychiatric: He has a normal mood and affect.  Nursing note and vitals reviewed.    ED Treatments / Results  Labs (all labs ordered are listed, but only abnormal results are displayed) Labs Reviewed  CBC WITH DIFFERENTIAL/PLATELET - Abnormal; Notable for the following:       Result Value   RBC 3.90 (*)    Hemoglobin 11.6 (*)    HCT 34.8 (*)    All other components within normal limits  COMPREHENSIVE METABOLIC PANEL - Abnormal; Notable for the following:     Glucose, Bld 103 (*)    BUN 5 (*)    Calcium 8.8 (*)    AST 75 (*)    All other components within normal limits  LIPASE, BLOOD    EKG  EKG Interpretation  Date/Time:  Monday April 11 2016 05:35:00 EST Ventricular Rate:  82 PR Interval:    QRS Duration: 78 QT Interval:  349 QTC Calculation: 408 R Axis:   70 Text Interpretation:  Sinus rhythm Short PR interval Right atrial enlargement Borderline repolarization abnormality Minimal ST elevation, anterior leads Confirmed by Chiann Goffredo  MD, Loma Sousa ([id number]) on 04/11/2016 5:39:44 AM       Radiology No results found.  Procedures Procedures (including critical care time)  Medications Ordered in ED Medications  sodium chloride 0.9 % bolus 1,000 mL (not administered)  ondansetron (ZOFRAN) injection 4 mg (4 mg Intravenous Given 04/11/16 0611)  haloperidol lactate (HALDOL) injection 2 mg (2 mg Intravenous Given 04/11/16 0611)  sucralfate (CARAFATE) tablet 1 g (1 g Oral Given 04/11/16 0622)  pantoprazole (PROTONIX) injection 40 mg (40 mg Intravenous Given 04/11/16 [id number])     Initial Impression / Assessment and Plan / ED Course  I have reviewed the triage vital signs and the nursing notes.  Pertinent labs & imaging results that were available during my care of the patient were reviewed by me and considered in my medical decision making (see chart for details).  Clinical Course     Patient presents with abdominal pain. History of chronic abdominal pain and pancreatitis. Exam is benign. Lab work is reassuring. Patient given anti-medic, Carafate, protonic, Haldol. On recheck, he has dressed himself and states that he is ready to go. He is able to tolerate fluids.  After history, exam, and medical workup I feel the patient has been appropriately medically screened and is safe for discharge home. Pertinent diagnoses were discussed with the patient. Patient was given return precautions.   Final Clinical Impressions(s) / ED Diagnoses   Final  diagnoses:  Chronic abdominal pain  Other chronic pancreatitis (HCC)    New Prescriptions New Prescriptions   PANTOPRAZOLE (PROTONIX) 20 MG TABLET    Take 2 tablets (40 mg total) by mouth daily.     Merryl Hacker, MD 04/11/16 [phone]

## 2016-04-11 NOTE — ED Notes (Signed)
Pt verbalized understanding of d/c instructions and has no further questions. Pt is stable, A&Ox4, VSS.  

## 2016-04-17 ENCOUNTER — Emergency Department (HOSPITAL_COMMUNITY)
Admission: EM | Admit: 2016-04-17 | Discharge: 2016-04-17 | Disposition: A | Discharge status: Home / Self Care | Payer: Self-pay | Attending: Emergency Medicine | Admitting: Emergency Medicine

## 2016-04-17 ENCOUNTER — Encounter (HOSPITAL_COMMUNITY): Payer: Self-pay | Admitting: *Deleted

## 2016-04-17 DIAGNOSIS — J45909 Unspecified asthma, uncomplicated: Secondary | ICD-10-CM | POA: Insufficient documentation

## 2016-04-17 DIAGNOSIS — Y999 Unspecified external cause status: Secondary | ICD-10-CM | POA: Insufficient documentation

## 2016-04-17 DIAGNOSIS — Y939 Activity, unspecified: Secondary | ICD-10-CM | POA: Insufficient documentation

## 2016-04-17 DIAGNOSIS — Y929 Unspecified place or not applicable: Secondary | ICD-10-CM | POA: Insufficient documentation

## 2016-04-17 DIAGNOSIS — S76212A Strain of adductor muscle, fascia and tendon of left thigh, initial encounter: Secondary | ICD-10-CM | POA: Insufficient documentation

## 2016-04-17 DIAGNOSIS — I10 Essential (primary) hypertension: Secondary | ICD-10-CM | POA: Insufficient documentation

## 2016-04-17 DIAGNOSIS — K860 Alcohol-induced chronic pancreatitis: Secondary | ICD-10-CM | POA: Insufficient documentation

## 2016-04-17 DIAGNOSIS — X58XXXA Exposure to other specified factors, initial encounter: Secondary | ICD-10-CM | POA: Insufficient documentation

## 2016-04-17 DIAGNOSIS — F17210 Nicotine dependence, cigarettes, uncomplicated: Secondary | ICD-10-CM | POA: Insufficient documentation

## 2016-04-17 MED ORDER — ONDANSETRON 4 MG PO TBDP
4.0000 mg | ORAL_TABLET | Freq: Once | ORAL | Status: AC
  Administered 2016-04-17: 4 mg via ORAL
  Filled 2016-04-17: qty 1

## 2016-04-17 MED ORDER — GI COCKTAIL ~~LOC~~
30.0000 mL | Freq: Once | ORAL | Status: AC
  Administered 2016-04-17: 30 mL via ORAL
  Filled 2016-04-17: qty 30

## 2016-04-17 MED ORDER — KETOROLAC TROMETHAMINE 60 MG/2ML IM SOLN
30.0000 mg | Freq: Once | INTRAMUSCULAR | Status: AC
  Administered 2016-04-17: 30 mg via INTRAMUSCULAR
  Filled 2016-04-17: qty 2

## 2016-04-17 MED ORDER — ONDANSETRON 4 MG PO TBDP: 4.0000 mg | ORAL_TABLET | Freq: Three times a day (TID) | ORAL | 0 refills | Status: DC | PRN

## 2016-04-17 NOTE — ED Provider Notes (Signed)
[facility] DEPT Provider Note   CSN: [id number] Arrival date & time: 04/17/16  1701     History   Chief Complaint Chief Complaint  Patient presents with  . Abdominal Pain    HPI [name] is a 45 y.o. male.  82 yoM with a chief complaint of chronic abdominal pain. This been going on for the past couple days. Patient has had some vomiting and diarrhea with it. States that he is unable to the injury anything at home. Then later states he was able to eat a little bit of tuna fish and had something to drink earlier today. Denies fevers or chills. Patient does have a new area of pain is in his left groin. Started a couple days ago was with movement and palpation.   The history is provided by the patient.  Abdominal Pain   This is a chronic problem. The current episode started more than 2 days ago. The problem occurs constantly. The problem has been gradually worsening. The pain is associated with eating and alcohol use. The pain is located in the epigastric region. The pain is at a severity of 9/10. The pain is moderate. Associated symptoms include diarrhea, nausea and vomiting. Pertinent negatives include fever, headaches, arthralgias and myalgias. The symptoms are aggravated by eating and drinking alcohol. Nothing relieves the symptoms. Past workup includes GI consult and CT scan.    Past Medical History:  Diagnosis Date  . Alcoholism /alcohol abuse ([place])   . Anemia   . Anxiety   . Arthritis    "knees; arms; elbows" (03/26/2015)  . Asthma   . Bipolar disorder ([place])   . Chronic bronchitis ([place])   . Chronic lower back pain   . Cocaine abuse   . Depression   . Family history of adverse reaction to anesthesia    "grandmother gets confused"  . Femoral condyle fracture ([place]) 03/08/2014   left medial/notes 03/09/2014  . GERD (gastroesophageal reflux disease)   . H/O hiatal hernia   . H/O suicide attempt 10/2012  . Heart murmur    "when he was little" (03/06/2013)  .  High cholesterol   . History of blood transfusion 10/2012   "when I tried to commit suicide"  . History of stomach ulcers   . Hypertension   . Marijuana abuse, continuous   . Migraine    "a few times/year" (03/26/2015)  . Pancreatitis   . Pneumonia 1990's X 3  . PTSD (post-traumatic stress disorder)   . Shortness of breath    "can happen at anytime" (03/06/2013)  . Sickle cell trait ([place])   . WPW (Wolff-Parkinson-White syndrome)    [name] 03/06/2013    Patient Active Problem List   Diagnosis Date Noted  . Leukocytosis   . Hospital acquired PNA 05/20/2015  . Pancreatitis 05/18/2015  . Pseudocyst of pancreas 05/18/2015  . Malnutrition of moderate degree 03/27/2015  . Abnormal transaminases 03/26/2015  . Polysubstance abuse (tobacco, cocaine, THC, and ETOH) 03/26/2015  . Acute pancreatitis 03/26/2015  . Alcohol-induced chronic pancreatitis ([place])   . Essential hypertension 02/06/2014  . Alcohol-induced acute pancreatitis 11/28/2013  . Pancreatic pseudocyst/cyst 11/25/2013  . Alcohol dependence ([place]) 10/23/2013  . MDD (major depressive disorder), recurrent severe, without psychosis ([place]) 10/22/2013  . Protein-calorie malnutrition, severe ([place]) 10/10/2013  . Suicide attempt 10/08/2013  . Overdose 10/06/2013  . 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 TOBACCO ABUSE 03/23/2007    Past Surgical History:  Procedure Laterality Date  . CARDIAC  CATHETERIZATION    . EYE SURGERY Left 1990's   "result of trauma"   . FACIAL FRACTURE SURGERY Left 1990's   "result of trauma"   . FRACTURE SURGERY    . HERNIA REPAIR    . LEFT HEART CATHETERIZATION WITH CORONARY ANGIOGRAM Right 03/07/2013   Procedure: LEFT HEART CATHETERIZATION WITH CORONARY ANGIOGRAM;  Surgeon: [name], MD;  Location: [facility] CATH LAB;  Service: Cardiovascular;  Laterality: Right;  . UMBILICAL HERNIA REPAIR         Home Medications    Prior to Admission medications   Medication  Sig Start Date End Date Taking? Authorizing Provider  albuterol (PROVENTIL HFA;VENTOLIN HFA) 108 (90 BASE) MCG/ACT inhaler Inhale 2 puffs into the lungs every 6 (six) hours as needed for wheezing or shortness of breath (wheezing).     Historical Provider, MD  cholestyramine (QUESTRAN) 4 g packet Take 1 packet (4 g total) by mouth 3 (three) times daily as needed (diarrhea). [id number]   Delora Fuel, MD  clindamycin (CLEOCIN) 300 MG capsule Take 1 capsule (300 mg total) by mouth 3 (three) times daily. Patient not taking: Reported on 04/11/2016 02/21/16   Kristen N Ward, DO  diphenoxylate-atropine (LOMOTIL) 2.5-0.025 MG tablet Take 1 tablet by mouth 4 (four) times daily as needed for diarrhea or loose stools. 12/04/15   [name], PA-C  fluticasone (FLONASE) 50 MCG/ACT nasal spray Place 1 spray into both nostrils daily as needed for allergies.     Historical Provider, MD  lipase/protease/amylase (CREON) 12000 UNITS CPEP capsule Take 2 capsules (24,000 Units total) by mouth 3 (three) times daily with meals. 01/15/15   [name], MD  metoprolol tartrate (LOPRESSOR) 25 MG tablet Take 1 tablet (25 mg total) by mouth 2 (two) times daily. 12/14/15   Tiffany Daneil Darianny Momon, PA-C  omeprazole (PRILOSEC) 20 MG capsule Take 1 capsule (20 mg total) by mouth daily. [id number]   Delora Fuel, MD  ondansetron (ZOFRAN ODT) 4 MG disintegrating tablet Take 1 tablet (4 mg total) by mouth every 8 (eight) hours as needed for nausea or vomiting. 04/17/16   Deno Etienne, DO  ondansetron (ZOFRAN ODT) 8 MG disintegrating tablet Take 1 tablet (8 mg total) by mouth every 8 (eight) hours as needed for nausea or vomiting. 01/25/16   [name], MD  pantoprazole (PROTONIX) 20 MG tablet Take 2 tablets (40 mg total) by mouth daily. 04/11/16   Merryl Hacker, MD  QUEtiapine (SEROQUEL) 50 MG tablet Take 100 mg by mouth at bedtime. 01/15/15   Historical Provider, MD  sertraline (ZOLOFT) 25 MG tablet Take 75 mg by mouth daily.    Historical  Provider, MD  sildenafil (VIAGRA) 50 MG tablet Take 1 tablet (50 mg total) by mouth daily as needed for erectile dysfunction. 12/14/15   Brayton Caves, PA-C  sucralfate (CARAFATE) 1 g tablet Take 1 tablet (1 g total) by mouth 4 (four) times daily -  with meals and at bedtime. 11/15/15   Galena Lions, PA-C    Family History Family History  Problem Relation Age of Onset  . Hypertension Other   . Coronary artery disease Other     Social History Social History  Substance Use Topics  . Smoking status: Current Every Day Smoker    Packs/day: 1.00    Years: 30.00    Types: Cigarettes  . Smokeless tobacco: Current User    Types: Chew  . Alcohol use 7.2 oz/week    12 Cans of beer per week  Allergies   Shellfish-derived products; Trazodone and nefazodone; and Reglan [metoclopramide]   Review of Systems Review of Systems  Constitutional: Negative for chills and fever.  HENT: Negative for congestion and facial swelling.   Eyes: Negative for discharge and visual disturbance.  Respiratory: Negative for shortness of breath.   Cardiovascular: Negative for chest pain and palpitations.  Gastrointestinal: Positive for abdominal pain, diarrhea, nausea and vomiting.  Musculoskeletal: Negative for arthralgias and myalgias.  Skin: Negative for color change and rash.  Neurological: Negative for tremors, syncope and headaches.  Psychiatric/Behavioral: Negative for confusion and dysphoric mood.     Physical Exam Updated Vital Signs BP 145/89   Pulse 93   Temp 97.8 F (36.6 C) (Oral)   Resp 18   SpO2 98%   Physical Exam  Constitutional: He is oriented to person, place, and time. He appears well-developed and well-nourished.  HENT:  Head: Normocephalic and atraumatic.  Eyes: EOM are normal. Pupils are equal, round, and reactive to light.  Neck: Normal range of motion. Neck supple. No JVD present.  Cardiovascular: Normal rate and regular rhythm.  Exam reveals no gallop and no  friction rub.   No murmur heard. Pulmonary/Chest: No respiratory distress. He has no wheezes.  Abdominal: He exhibits no distension and no mass. There is tenderness (epigastric). There is no rebound and no guarding. No hernia.  Genitourinary:     Musculoskeletal: Normal range of motion.  Neurological: He is alert and oriented to person, place, and time.  Skin: No rash noted. No pallor.  Psychiatric: He has a normal mood and affect. His behavior is normal.  Nursing note and vitals reviewed.    ED Treatments / Results  Labs (all labs ordered are listed, but only abnormal results are displayed) Labs Reviewed - No data to display  EKG  EKG Interpretation None       Radiology No results found.  Procedures Procedures (including critical care time)  Medications Ordered in ED Medications  ketorolac (TORADOL) injection 30 mg (not administered)  gi cocktail (Maalox,Lidocaine,Donnatal) (not administered)  ondansetron (ZOFRAN-ODT) disintegrating tablet 4 mg (not administered)     Initial Impression / Assessment and Plan / ED Course  I have reviewed the triage vital signs and the nursing notes.  Pertinent labs & imaging results that were available during my care of the patient were reviewed by me and considered in my medical decision making (see chart for details).  Clinical Course     45 yo M With a chief complaint of chronic abdominal pain. Patient states this feels like his normal pancreatitis. He also has been having pain to the left inguinal region that is worse with movement and palpation. Patient denies any injury to that area. This pain is new for him for the past couple days. Pain is reproduced with movement and palpation. No noted masses. I suspect the patient has a groin strain. Will start on on on NSAIDs. I discussed with the patient has a care plan will have the patient follow-up with his family physician.  8:04 PM:  I have discussed the diagnosis/risks/treatment  options with the patient and believe the pt to be eligible for discharge home to follow-up with PCP. We also discussed returning to the ED immediately if new or worsening sx occur. We discussed the sx which are most concerning (e.g., sudden worsening pain, fever, inability to tolerate by mouth) that necessitate immediate return. Medications administered to the patient during their visit and any new prescriptions provided to the patient  are listed below.  Medications given during this visit Medications  ketorolac (TORADOL) injection 30 mg (not administered)  gi cocktail (Maalox,Lidocaine,Donnatal) (not administered)  ondansetron (ZOFRAN-ODT) disintegrating tablet 4 mg (not administered)     The patient appears reasonably screen and/or stabilized for discharge and I doubt any other medical condition or other [facility] requiring further screening, evaluation, or treatment in the ED at this time prior to discharge.    Final Clinical Impressions(s) / ED Diagnoses   Final diagnoses:  Alcohol-induced chronic pancreatitis ([place])  Groin strain, left, initial encounter    New Prescriptions New Prescriptions   ONDANSETRON (ZOFRAN ODT) 4 MG DISINTEGRATING TABLET    Take 1 tablet (4 mg total) by mouth every 8 (eight) hours as needed for nausea or vomiting.     Deno Etienne, DO 04/17/16 2005

## 2016-04-17 NOTE — ED Triage Notes (Signed)
Pt arrived by ptar for chronic abd pain and n/v/d, states it feels like his pancreatitis.

## 2016-04-17 NOTE — ED Notes (Signed)
Pt verbalized understanding discharge instructions and denies any further needs or questions at this time. VS stable, ambulatory and steady gait.   

## 2016-04-24 ENCOUNTER — Encounter (HOSPITAL_COMMUNITY): Payer: Self-pay

## 2016-04-24 ENCOUNTER — Emergency Department (HOSPITAL_COMMUNITY)
Admission: EM | Admit: 2016-04-24 | Discharge: 2016-04-24 | Disposition: A | Discharge status: Home / Self Care | Payer: Self-pay | Attending: Emergency Medicine | Admitting: Emergency Medicine

## 2016-04-24 DIAGNOSIS — I10 Essential (primary) hypertension: Secondary | ICD-10-CM | POA: Insufficient documentation

## 2016-04-24 DIAGNOSIS — Z79899 Other long term (current) drug therapy: Secondary | ICD-10-CM | POA: Insufficient documentation

## 2016-04-24 DIAGNOSIS — F10239 Alcohol dependence with withdrawal, unspecified: Secondary | ICD-10-CM | POA: Insufficient documentation

## 2016-04-24 DIAGNOSIS — F10230 Alcohol dependence with withdrawal, uncomplicated: Secondary | ICD-10-CM

## 2016-04-24 DIAGNOSIS — J45909 Unspecified asthma, uncomplicated: Secondary | ICD-10-CM | POA: Insufficient documentation

## 2016-04-24 DIAGNOSIS — F17210 Nicotine dependence, cigarettes, uncomplicated: Secondary | ICD-10-CM | POA: Insufficient documentation

## 2016-04-24 DIAGNOSIS — E860 Dehydration: Secondary | ICD-10-CM | POA: Insufficient documentation

## 2016-04-24 DIAGNOSIS — M62838 Other muscle spasm: Secondary | ICD-10-CM | POA: Insufficient documentation

## 2016-04-24 DIAGNOSIS — F10930 Alcohol use, unspecified with withdrawal, uncomplicated: Secondary | ICD-10-CM

## 2016-04-24 LAB — LIPASE, BLOOD: Lipase: 50 U/L (ref 11–51)

## 2016-04-24 LAB — URINALYSIS, ROUTINE W REFLEX MICROSCOPIC
BILIRUBIN URINE: NEGATIVE
Glucose, UA: NEGATIVE mg/dL
HGB URINE DIPSTICK: NEGATIVE
KETONES UR: NEGATIVE mg/dL
Leukocytes, UA: NEGATIVE
NITRITE: NEGATIVE
PROTEIN: NEGATIVE mg/dL
Specific Gravity, Urine: 1.02 (ref 1.005–1.030)
pH: 5 (ref 5.0–8.0)

## 2016-04-24 LAB — COMPREHENSIVE METABOLIC PANEL
ALBUMIN: 3.6 g/dL (ref 3.5–5.0)
ALK PHOS: 86 U/L (ref 38–126)
ALT: 35 U/L (ref 17–63)
ANION GAP: 11 (ref 5–15)
AST: 40 U/L (ref 15–41)
BILIRUBIN TOTAL: 0.4 mg/dL (ref 0.3–1.2)
CALCIUM: 8.9 mg/dL (ref 8.9–10.3)
CO2: 18 mmol/L — AB (ref 22–32)
CREATININE: 0.71 mg/dL (ref 0.61–1.24)
Chloride: 111 mmol/L (ref 101–111)
GFR calc Af Amer: >60 mL/min (ref >60)
GFR calc non Af Amer: >60 mL/min (ref >60)
GLUCOSE: 124 mg/dL — AB (ref 65–99)
Potassium: 3.8 mmol/L (ref 3.5–5.1)
Sodium: 140 mmol/L (ref 135–145)
TOTAL PROTEIN: 6.9 g/dL (ref 6.5–8.1)

## 2016-04-24 LAB — CBC
HCT: 34.9 % — ABNORMAL LOW (ref 39.0–52.0)
HEMOGLOBIN: 11.5 g/dL — AB (ref 13.0–17.0)
MCH: 30.3 pg (ref 26.0–34.0)
MCHC: 33 g/dL (ref 30.0–36.0)
MCV: 91.8 fL (ref 78.0–100.0)
PLATELETS: 236 10*3/uL (ref 150–400)
RBC: 3.8 MIL/uL — ABNORMAL LOW (ref 4.22–5.81)
RDW: 15.2 % (ref 11.5–15.5)
WBC: 11.2 10*3/uL — ABNORMAL HIGH (ref 4.0–10.5)

## 2016-04-24 MED ORDER — LORAZEPAM 1 MG PO TABS
1.0000 mg | ORAL_TABLET | Freq: Once | ORAL | Status: AC
  Administered 2016-04-24: 1 mg via ORAL
  Filled 2016-04-24: qty 1

## 2016-04-24 MED ORDER — GI COCKTAIL ~~LOC~~
30.0000 mL | Freq: Once | ORAL | Status: AC
  Administered 2016-04-24: 30 mL via ORAL
  Filled 2016-04-24: qty 30

## 2016-04-24 MED ORDER — SODIUM CHLORIDE 0.9 % IV BOLUS (SEPSIS)
1000.0000 mL | Freq: Once | INTRAVENOUS | Status: AC
  Administered 2016-04-24: 1000 mL via INTRAVENOUS

## 2016-04-24 MED ORDER — CYCLOBENZAPRINE HCL 10 MG PO TABS
10.0000 mg | ORAL_TABLET | Freq: Once | ORAL | Status: AC
  Administered 2016-04-24: 10 mg via ORAL
  Filled 2016-04-24: qty 1

## 2016-04-24 MED ORDER — LORAZEPAM 1 MG PO TABS
2.0000 mg | ORAL_TABLET | Freq: Once | ORAL | Status: AC
  Administered 2016-04-24: 2 mg via ORAL
  Filled 2016-04-24: qty 2

## 2016-04-24 NOTE — ED Triage Notes (Signed)
Pt comes via [facility], pt states that he started taking amoxicillin yesterday for tooth extraction, pt states he feels he is allergic to it, abd pain, n/v/d, no hives or distress noted, lung sounds clear. Pt states he feels like he is jerking.

## 2016-04-24 NOTE — ED Notes (Signed)
Clergy present speaking with pt

## 2016-04-24 NOTE — ED Triage Notes (Signed)
Per [facility]- Pt just released from [facility] for presenting complaint. Pt here for same complaint. No new complaints.

## 2016-04-24 NOTE — ED Notes (Addendum)
While this tech is updating pt vitals pt is sitting in a chair with a jerking motion of arm and legs. Pt is walking around in the lobby.

## 2016-04-24 NOTE — ED Notes (Signed)
Seen and treated at [facility] for presenting complaint. Given meds while in ED. Pt states "they have worn off and that's why he is here."

## 2016-04-24 NOTE — ED Notes (Signed)
Pt without audience will walk to bathroom with no muscle twitching. Pt with audience, pt will present with increase muscle twitching and inability to remain in one location. Pt continues to pace. Pt given meal. Pt encouraged to remain in room and on stretcher for safety. Security at outside of room to assist with this encouragement.

## 2016-04-24 NOTE — ED Notes (Signed)
Pt ambulated independent to bathroom without twitching and gait steady.

## 2016-04-24 NOTE — ED Notes (Signed)
Bed: WA08 Expected date:  Expected time:  Means of arrival:  Comments: 45 yo muscle spasms-seen last pm

## 2016-04-24 NOTE — ED Notes (Addendum)
Pt continues to have muscle twitching however during vitals with encouragement pt able to remain calm and still. Pt requesting RX for home for nerves. EDP at discharge stated no RX would be given.

## 2016-04-24 NOTE — ED Notes (Signed)
ED Provider at bedside. 

## 2016-04-24 NOTE — ED Provider Notes (Signed)
[facility] DEPT Provider Note   CSN: [id number] Arrival date & time: 04/24/16  0906     History   Chief Complaint Chief Complaint  Patient presents with  . Muscle Pain    HPI [name] is a 45 y.o. male.  HPI 45 year old male known to this ED who presents with diffuse body cramps. Pt states he is currently on amoxicillin for tooth infection and since starting, has had intermittent "jerking" of his arms and legs. However, he has also not drank for 3 days which is abnormal for him. Denies any seizures. He was just seen in the ED 5 hours ago for these sx at which time he was given PO ativan and d/c'ed. He states the ativan wore off so he is back. States his spasms cause severe, cramp-like pain with nausea. No vomiting. No seizures or LOC. No fever or chills. No rash or SOB.  Past Medical History:  Diagnosis Date  . Alcoholism /alcohol abuse ([place])   . Anemia   . Anxiety   . Arthritis    "knees; arms; elbows" (03/26/2015)  . Asthma   . Bipolar disorder ([place])   . Chronic bronchitis ([place])   . Chronic lower back pain   . Cocaine abuse   . Depression   . Family history of adverse reaction to anesthesia    "grandmother gets confused"  . Femoral condyle fracture ([place]) 03/08/2014   left medial/notes 03/09/2014  . GERD (gastroesophageal reflux disease)   . H/O hiatal hernia   . H/O suicide attempt 10/2012  . Heart murmur    "when he was little" (03/06/2013)  . High cholesterol   . History of blood transfusion 10/2012   "when I tried to commit suicide"  . History of stomach ulcers   . Hypertension   . Marijuana abuse, continuous   . Migraine    "a few times/year" (03/26/2015)  . Pancreatitis   . Pneumonia 1990's X 3  . PTSD (post-traumatic stress disorder)   . Shortness of breath    "can happen at anytime" (03/06/2013)  . Sickle cell trait ([place])   . WPW (Wolff-Parkinson-White syndrome)    [name] 03/06/2013    Patient Active Problem List   Diagnosis Date Noted    . Leukocytosis   . Hospital acquired PNA 05/20/2015  . Pancreatitis 05/18/2015  . Pseudocyst of pancreas 05/18/2015  . Malnutrition of moderate degree 03/27/2015  . Abnormal transaminases 03/26/2015  . Polysubstance abuse (tobacco, cocaine, THC, and ETOH) 03/26/2015  . Acute pancreatitis 03/26/2015  . Alcohol-induced chronic pancreatitis ([place])   . Essential hypertension 02/06/2014  . Alcohol-induced acute pancreatitis 11/28/2013  . Pancreatic pseudocyst/cyst 11/25/2013  . Alcohol dependence ([place]) 10/23/2013  . MDD (major depressive disorder), recurrent severe, without psychosis ([place]) 10/22/2013  . Protein-calorie malnutrition, severe ([place]) 10/10/2013  . Suicide attempt 10/08/2013  . Overdose 10/06/2013  . 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 TOBACCO ABUSE 03/23/2007    Past Surgical History:  Procedure Laterality Date  . CARDIAC CATHETERIZATION    . EYE SURGERY Left 1990's   "result of trauma"   . FACIAL FRACTURE SURGERY Left 1990's   "result of trauma"   . FRACTURE SURGERY    . HERNIA REPAIR    . LEFT HEART CATHETERIZATION WITH CORONARY ANGIOGRAM Right 03/07/2013   Procedure: LEFT HEART CATHETERIZATION WITH CORONARY ANGIOGRAM;  Surgeon: [name], MD;  Location: [facility] CATH LAB;  Service: Cardiovascular;  Laterality: Right;  . UMBILICAL HERNIA REPAIR  Home Medications    Prior to Admission medications   Medication Sig Start Date End Date Taking? Authorizing Provider  acetaminophen-codeine (TYLENOL #3) 300-30 MG tablet Take 2 tablets by mouth every 4 (four) hours as needed for moderate pain.   Yes Historical Provider, MD  amoxicillin (AMOXIL) 500 MG capsule Take 500 mg by mouth 3 (three) times daily.   Yes Historical Provider, MD  cholestyramine (QUESTRAN) 4 g packet Take 1 packet (4 g total) by mouth 3 (three) times daily as needed (diarrhea). [id number]  Yes Delora Fuel, MD  fluticasone [facility]) 50 MCG/ACT nasal spray Place 1  spray into both nostrils daily as needed for allergies.    Yes Historical Provider, MD  lipase/protease/amylase (CREON) 12000 UNITS CPEP capsule Take 2 capsules (24,000 Units total) by mouth 3 (three) times daily with meals. 01/15/15  Yes [name], MD  metoprolol tartrate (LOPRESSOR) 25 MG tablet Take 1 tablet (25 mg total) by mouth 2 (two) times daily. 12/14/15  Yes Tiffany Daneil Dan, PA-C  omeprazole (PRILOSEC) 20 MG capsule Take 1 capsule (20 mg total) by mouth daily. [id number]  Yes Delora Fuel, MD  ondansetron (ZOFRAN ODT) 8 MG disintegrating tablet Take 1 tablet (8 mg total) by mouth every 8 (eight) hours as needed for nausea or vomiting. 01/25/16  Yes [name], MD  QUEtiapine (SEROQUEL) 100 MG tablet Take 150 mg by mouth at bedtime.  01/15/15  Yes Historical Provider, MD  sertraline (ZOLOFT) 25 MG tablet Take 75 mg by mouth daily.   Yes Historical Provider, MD  sildenafil (VIAGRA) 50 MG tablet Take 1 tablet (50 mg total) by mouth daily as needed for erectile dysfunction. 12/14/15  Yes Tiffany Daneil Dan, PA-C  sucralfate (CARAFATE) 1 g tablet Take 1 tablet (1 g total) by mouth 4 (four) times daily -  with meals and at bedtime. 11/15/15  Yes  Lions, PA-C  albuterol (PROVENTIL HFA;VENTOLIN HFA) 108 (90 BASE) MCG/ACT inhaler Inhale 2 puffs into the lungs every 6 (six) hours as needed for wheezing or shortness of breath (wheezing).     Historical Provider, MD  clindamycin (CLEOCIN) 300 MG capsule Take 1 capsule (300 mg total) by mouth 3 (three) times daily. Patient not taking: Reported on 04/24/2016 02/21/16   Kristen N Ward, DO  diphenoxylate-atropine (LOMOTIL) 2.5-0.025 MG tablet Take 1 tablet by mouth 4 (four) times daily as needed for diarrhea or loose stools. Patient not taking: Reported on 04/24/2016 12/04/15   [name], PA-C  ondansetron (ZOFRAN ODT) 4 MG disintegrating tablet Take 1 tablet (4 mg total) by mouth every 8 (eight) hours as needed for nausea or  vomiting. Patient not taking: Reported on 04/24/2016 04/17/16   Deno Etienne, DO  pantoprazole (PROTONIX) 20 MG tablet Take 2 tablets (40 mg total) by mouth daily. Patient not taking: Reported on 04/24/2016 04/11/16   Merryl Hacker, MD    Family History Family History  Problem Relation Age of Onset  . Hypertension Other   . Coronary artery disease Other     Social History Social History  Substance Use Topics  . Smoking status: Current Every Day Smoker    Packs/day: 1.00    Years: 30.00    Types: Cigarettes  . Smokeless tobacco: Current User    Types: Chew  . Alcohol use 7.2 oz/week    12 Cans of beer per week     Allergies   Shellfish-derived products; Trazodone and nefazodone; and Reglan [metoclopramide]   Review of Systems Review of  Systems  Constitutional: Positive for fatigue. Negative for chills and fever.  HENT: Negative for congestion and rhinorrhea.   Eyes: Negative for visual disturbance.  Respiratory: Negative for cough, shortness of breath and wheezing.   Cardiovascular: Negative for chest pain and leg swelling.  Gastrointestinal: Positive for abdominal pain and nausea. Negative for diarrhea and vomiting.  Genitourinary: Negative for dysuria and flank pain.  Musculoskeletal: Positive for arthralgias and myalgias. Negative for neck pain and neck stiffness.  Skin: Negative for rash and wound.  Allergic/Immunologic: Negative for immunocompromised state.  Neurological: Negative for syncope, weakness and headaches.  All other systems reviewed and are negative.    Physical Exam Updated Vital Signs BP 137/95 (BP Location: Right Arm)   Pulse 101   Temp 98.3 F (36.8 C) (Oral)   Resp 18   Ht 5\' 9"  (1.753 m)   Wt 150 lb (68 kg)   SpO2 96%   BMI 22.15 kg/m   Physical Exam  Constitutional: He is oriented to person, place, and time. He appears well-developed and well-nourished. No distress.  HENT:  Head: Normocephalic and atraumatic.  Eyes: Conjunctivae  are normal.  Neck: Neck supple.  Cardiovascular: Normal rate, regular rhythm and normal heart sounds.  Exam reveals no friction rub.   No murmur heard. Pulmonary/Chest: Effort normal and breath sounds normal. No respiratory distress. He has no wheezes. He has no rales.  Abdominal: Soft. Bowel sounds are normal. He exhibits no distension. There is tenderness (mild, distractible, worse with flexion of abdominal wall musculature). There is no rebound and no guarding.  Musculoskeletal: He exhibits no edema.  Neurological: He is alert and oriented to person, place, and time. He exhibits normal muscle tone.  Intermittent spasms of diffuse muscle groups, that are distractible.  Skin: Skin is warm. Capillary refill takes less than 2 seconds.  Psychiatric: He has a normal mood and affect.  Nursing note and vitals reviewed.    ED Treatments / Results  Labs (all labs ordered are listed, but only abnormal results are displayed) Labs Reviewed - No data to display  EKG  EKG Interpretation None       Radiology No results found.  Procedures Procedures (including critical care time)  Medications Ordered in ED Medications  LORazepam (ATIVAN) tablet 2 mg (2 mg Oral Given 04/24/16 0944)  sodium chloride 0.9 % bolus 1,000 mL (0 mLs Intravenous Stopped 04/24/16 1052)  gi cocktail (Maalox,Lidocaine,Donnatal) (30 mLs Oral Given 04/24/16 0945)  cyclobenzaprine (FLEXERIL) tablet 10 mg (10 mg Oral Given 04/24/16 1110)     Initial Impression / Assessment and Plan / ED Course  I have reviewed the triage vital signs and the nursing notes.  Pertinent labs & imaging results that were available during my care of the patient were reviewed by me and considered in my medical decision making (see chart for details).  Clinical Course     45 year old male with past medical history of chronic polysubstance abuse, malingering, and chronic abdominal pain with alcoholism and alcohol-induced pancreatitis here  with diffuse muscle spasms. Patient was just seen at [facility] approximately 4-5 hours ago. He was given one Ativan and discharged home. Lab work shows mild dehydration but is otherwise unremarkable from that visit. On my assessment, patient has involuntary spasms that seem to resolve when the examiner is not in the room. He does have some mild tachycardia so I have given him 1 L of fluid as well as Ativan given his history of not drinking tonight and history of  alcohol withdrawal. Otherwise, no seizures, altered sensorium, or evidence of compensated alcohol withdrawal. Patient is otherwise hemodynamically stable and at his baseline according to himself as well as review of records. Given recent negative labs and concern for possible malingering, will discharge with outpatient follow-up. He was given a dose of Ativan here and states intent to continue drinking, so will hold on Librium taper.  Final Clinical Impressions(s) / ED Diagnoses   Final diagnoses:  Muscle spasm  Dehydration    New Prescriptions New Prescriptions   No medications on file     [name], MD 04/24/16 1116

## 2016-04-25 NOTE — ED Provider Notes (Signed)
[facility] DEPT Provider Note   CSN: [id number] Arrival date & time: 04/24/16  0357     History   Chief Complaint Chief Complaint  Patient presents with  . Allergic Reaction  . Abdominal Pain    HPI MARKELL AVRAM is a 45 y.o. male.  HPI  Patient complaining of diffuse muscle spasms. He is having some jerking movements. He has a history of alcohol abuse and has not had any alcohol for several days. He denies any head injury, chest pain, or dyspnea.  Past Medical History:  Diagnosis Date  . Alcoholism /alcohol abuse ([place])   . Anemia   . Anxiety   . Arthritis    "knees; arms; elbows" (03/26/2015)  . Asthma   . Bipolar disorder ([place])   . Chronic bronchitis ([place])   . Chronic lower back pain   . Cocaine abuse   . Depression   . Family history of adverse reaction to anesthesia    "grandmother gets confused"  . Femoral condyle fracture ([place]) 03/08/2014   left medial/notes 03/09/2014  . GERD (gastroesophageal reflux disease)   . H/O hiatal hernia   . H/O suicide attempt 10/2012  . Heart murmur    "when he was little" (03/06/2013)  . High cholesterol   . History of blood transfusion 10/2012   "when I tried to commit suicide"  . History of stomach ulcers   . Hypertension   . Marijuana abuse, continuous   . Migraine    "a few times/year" (03/26/2015)  . Pancreatitis   . Pneumonia 1990's X 3  . PTSD (post-traumatic stress disorder)   . Shortness of breath    "can happen at anytime" (03/06/2013)  . Sickle cell trait ([place])   . WPW (Wolff-Parkinson-White syndrome)    [name] 03/06/2013    Patient Active Problem List   Diagnosis Date Noted  . Leukocytosis   . Hospital acquired PNA 05/20/2015  . Pancreatitis 05/18/2015  . Pseudocyst of pancreas 05/18/2015  . Malnutrition of moderate degree 03/27/2015  . Abnormal transaminases 03/26/2015  . Polysubstance abuse (tobacco, cocaine, THC, and ETOH) 03/26/2015  . Acute pancreatitis 03/26/2015  . Alcohol-induced  chronic pancreatitis ([place])   . Essential hypertension 02/06/2014  . Alcohol-induced acute pancreatitis 11/28/2013  . Pancreatic pseudocyst/cyst 11/25/2013  . Alcohol dependence ([place]) 10/23/2013  . 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 Protein-calorie malnutrition, severe ([place]) 10/10/2013  . Suicide attempt 10/08/2013  . Overdose 10/06/2013  . 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 TOBACCO ABUSE 03/23/2007    Past Surgical History:  Procedure Laterality Date  . CARDIAC CATHETERIZATION    . EYE SURGERY Left 1990's   "result of trauma"   . FACIAL FRACTURE SURGERY Left 1990's   "result of trauma"   . FRACTURE SURGERY    . HERNIA REPAIR    . LEFT HEART CATHETERIZATION WITH CORONARY ANGIOGRAM Right 03/07/2013   Procedure: LEFT HEART CATHETERIZATION WITH CORONARY ANGIOGRAM;  Surgeon: [name], MD;  Location: [facility] CATH [facility];  Service: Cardiovascular;  Laterality: Right;  . UMBILICAL HERNIA REPAIR         Home Medications    Prior to Admission medications   Medication Sig Start Date End Date Taking? Authorizing Provider  acetaminophen-codeine (TYLENOL #3) 300-30 MG tablet Take 2 tablets by mouth every 4 (four) hours as needed for moderate pain.    Historical Provider, MD  albuterol (PROVENTIL HFA;VENTOLIN HFA) 108 (90 BASE) MCG/ACT inhaler Inhale 2 puffs  into the lungs every 6 (six) hours as needed for wheezing or shortness of breath (wheezing).     Historical Provider, MD  amoxicillin (AMOXIL) 500 MG capsule Take 500 mg by mouth 3 (three) times daily.    Historical Provider, MD  cholestyramine (QUESTRAN) 4 g packet Take 1 packet (4 g total) by mouth 3 (three) times daily as needed (diarrhea). [id number]   Delora Fuel, MD  clindamycin (CLEOCIN) 300 MG capsule Take 1 capsule (300 mg total) by mouth 3 (three) times daily. Patient not taking: Reported on 04/24/2016 02/21/16   Kristen N Ward, DO    diphenoxylate-atropine (LOMOTIL) 2.5-0.025 MG tablet Take 1 tablet by mouth 4 (four) times daily as needed for diarrhea or loose stools. Patient not taking: Reported on 04/24/2016 12/04/15   [name], PA-C  fluticasone (FLONASE) 50 MCG/ACT nasal spray Place 1 spray into both nostrils daily as needed for allergies.     Historical Provider, MD  lipase/protease/amylase (CREON) 12000 UNITS CPEP capsule Take 2 capsules (24,000 Units total) by mouth 3 (three) times daily with meals. 01/15/15   [name], MD  metoprolol tartrate (LOPRESSOR) 25 MG tablet Take 1 tablet (25 mg total) by mouth 2 (two) times daily. 12/14/15   Tiffany Daneil Dan, PA-C  omeprazole (PRILOSEC) 20 MG capsule Take 1 capsule (20 mg total) by mouth daily. [id number]   Delora Fuel, MD  ondansetron (ZOFRAN ODT) 4 MG disintegrating tablet Take 1 tablet (4 mg total) by mouth every 8 (eight) hours as needed for nausea or vomiting. Patient not taking: Reported on 04/24/2016 04/17/16   Deno Etienne, DO  ondansetron (ZOFRAN ODT) 8 MG disintegrating tablet Take 1 tablet (8 mg total) by mouth every 8 (eight) hours as needed for nausea or vomiting. 01/25/16   [name], MD  pantoprazole (PROTONIX) 20 MG tablet Take 2 tablets (40 mg total) by mouth daily. Patient not taking: Reported on 04/24/2016 04/11/16   Merryl Hacker, MD  QUEtiapine (SEROQUEL) 100 MG tablet Take 150 mg by mouth at bedtime.  01/15/15   Historical Provider, MD  sertraline (ZOLOFT) 25 MG tablet Take 75 mg by mouth daily.    Historical Provider, MD  sildenafil (VIAGRA) 50 MG tablet Take 1 tablet (50 mg total) by mouth daily as needed for erectile dysfunction. 12/14/15   Brayton Caves, PA-C  sucralfate (CARAFATE) 1 g tablet Take 1 tablet (1 g total) by mouth 4 (four) times daily -  with meals and at bedtime. 11/15/15   Hendrix Lions, PA-C    Family History Family History  Problem Relation Age of Onset  . Hypertension Other   . Coronary artery disease Other      Social History Social History  Substance Use Topics  . Smoking status: Current Every Day Smoker    Packs/day: 1.00    Years: 30.00    Types: Cigarettes  . Smokeless tobacco: Current User    Types: Chew  . Alcohol use 7.2 oz/week    12 Cans of beer per week     Allergies   Shellfish-derived products; Trazodone and nefazodone; and Reglan [metoclopramide]   Review of Systems Review of Systems  All other systems reviewed and are negative.    Physical Exam Updated Vital Signs BP 141/98 (BP Location: Right Arm)   Pulse 100   Temp 97.3 F (36.3 C) (Oral)   Resp 16   Ht 5\' 9"  (1.753 m)   Wt 68 kg   SpO2 100%   BMI 22.15  kg/m   Physical Exam  Constitutional: He is oriented to person, place, and time. He appears well-developed and well-nourished. No distress.  HENT:  Head: Normocephalic and atraumatic.  Eyes: Pupils are equal, round, and reactive to light.  Neck: Normal range of motion.  Cardiovascular: Normal rate and normal heart sounds.   Pulmonary/Chest: Effort normal and breath sounds normal.  Abdominal: Soft.  Musculoskeletal: Normal range of motion.  Neurological: He is alert and oriented to person, place, and time. He displays normal reflexes. No cranial nerve deficit. He exhibits normal muscle tone. Coordination normal.  Some intermittent jerking movement of alternating extremities  Skin: Skin is warm. Capillary refill takes less than 2 seconds.  Psychiatric: He has a normal mood and affect.  Nursing note and vitals reviewed.    ED Treatments / Results  Labs (all labs ordered are listed, but only abnormal results are displayed) Labs Reviewed  COMPREHENSIVE METABOLIC PANEL - Abnormal; Notable for the following:       Result Value   CO2 18 (*)    Glucose, Bld 124 (*)    BUN <5 (*)    All other components within normal limits  CBC - Abnormal; Notable for the following:    WBC 11.2 (*)    RBC 3.80 (*)    Hemoglobin 11.5 (*)    HCT 34.9 (*)    All  other components within normal limits  LIPASE, BLOOD  URINALYSIS, ROUTINE W REFLEX MICROSCOPIC    EKG  EKG Interpretation None       Radiology No results found.  Procedures Procedures (including critical care time)  Medications Ordered in ED Medications  LORazepam (ATIVAN) tablet 1 mg (1 mg Oral Given 04/24/16 0714)  gi cocktail (Maalox,Lidocaine,Donnatal) (30 mLs Oral Given 04/24/16 [id number])     Initial Impression / Assessment and Plan / ED Course  I have reviewed the triage vital signs and the nursing notes.  Pertinent labs & imaging results that were available during my care of the patient were reviewed by me and considered in my medical decision making (see chart for details).  Clinical Course     Lab work normal and patient given 1 mg of Ativan. Discharged home in stable condition  Final Clinical Impressions(s) / ED Diagnoses   Final diagnoses:  Alcohol withdrawal syndrome without complication [facility])    New Prescriptions Discharge Medication List as of 04/24/2016  7:36 AM       Pattricia Boss, MD 04/25/16 1704

## 2016-04-26 ENCOUNTER — Encounter (HOSPITAL_COMMUNITY): Payer: Self-pay | Admitting: Emergency Medicine

## 2016-04-26 ENCOUNTER — Emergency Department (HOSPITAL_COMMUNITY)
Admission: EM | Admit: 2016-04-26 | Discharge: 2016-04-26 | Disposition: A | Discharge status: Home / Self Care | Payer: Self-pay | Attending: Emergency Medicine | Admitting: Emergency Medicine

## 2016-04-26 DIAGNOSIS — J45909 Unspecified asthma, uncomplicated: Secondary | ICD-10-CM | POA: Insufficient documentation

## 2016-04-26 DIAGNOSIS — I10 Essential (primary) hypertension: Secondary | ICD-10-CM | POA: Insufficient documentation

## 2016-04-26 DIAGNOSIS — Z79899 Other long term (current) drug therapy: Secondary | ICD-10-CM | POA: Insufficient documentation

## 2016-04-26 DIAGNOSIS — M62838 Other muscle spasm: Secondary | ICD-10-CM | POA: Insufficient documentation

## 2016-04-26 DIAGNOSIS — F17210 Nicotine dependence, cigarettes, uncomplicated: Secondary | ICD-10-CM | POA: Insufficient documentation

## 2016-04-26 LAB — I-STAT CHEM 8, ED
BUN: 6 mg/dL (ref 6–20)
CREATININE: 0.7 mg/dL (ref 0.61–1.24)
Calcium, Ion: 1.17 mmol/L (ref 1.15–1.40)
Chloride: 108 mmol/L (ref 101–111)
Glucose, Bld: 80 mg/dL (ref 65–99)
HEMATOCRIT: 37 % — AB (ref 39.0–52.0)
HEMOGLOBIN: 12.6 g/dL — AB (ref 13.0–17.0)
POTASSIUM: 3.9 mmol/L (ref 3.5–5.1)
SODIUM: 140 mmol/L (ref 135–145)
TCO2: 23 mmol/L (ref 0–100)

## 2016-04-26 MED ORDER — VITAMIN B-1 100 MG PO TABS
100.0000 mg | ORAL_TABLET | Freq: Once | ORAL | Status: AC
  Administered 2016-04-26: 100 mg via ORAL
  Filled 2016-04-26: qty 1

## 2016-04-26 MED ORDER — LORAZEPAM 1 MG PO TABS
1.0000 mg | ORAL_TABLET | Freq: Once | ORAL | Status: AC
  Administered 2016-04-26: 1 mg via ORAL
  Filled 2016-04-26: qty 1

## 2016-04-26 NOTE — ED Triage Notes (Signed)
Pt c/o 7/10 right leg and arm pain, states there are like spasm. Pt seen yesterday for the same complain and sent home. Denies any injury

## 2016-04-26 NOTE — ED Provider Notes (Signed)
[facility] DEPT Provider Note   CSN: [id number] Arrival date & time: 04/26/16  0418     History   Chief Complaint Chief Complaint  Patient presents with  . Arm Pain  . Leg Pain    HPI [name] is a 45 y.o. male.  HPI  This is a 45 year old male well known to our emergency department who presents with right arm cramping and spasms. Was seen and evaluated 2 days ago for the same. Improved with Ativan and hydration. Thought may be related to early withdrawal. Patient reports he has cut down on drinking. Last drank last night. Reports "spasms" in the right arm and right leg. Denies any weakness, numbness, tingling. Denies any chest pain or shortness of breath.  Past Medical History:  Diagnosis Date  . Alcoholism /alcohol abuse ([place])   . Anemia   . Anxiety   . Arthritis    "knees; arms; elbows" (03/26/2015)  . Asthma   . Bipolar disorder ([place])   . Chronic bronchitis ([place])   . Chronic lower back pain   . Cocaine abuse   . Depression   . Family history of adverse reaction to anesthesia    "grandmother gets confused"  . Femoral condyle fracture ([place]) 03/08/2014   left medial/notes 03/09/2014  . GERD (gastroesophageal reflux disease)   . H/O hiatal hernia   . H/O suicide attempt 10/2012  . Heart murmur    "when he was little" (03/06/2013)  . High cholesterol   . History of blood transfusion 10/2012   "when I tried to commit suicide"  . History of stomach ulcers   . Hypertension   . Marijuana abuse, continuous   . Migraine    "a few times/year" (03/26/2015)  . Pancreatitis   . Pneumonia 1990's X 3  . PTSD (post-traumatic stress disorder)   . Shortness of breath    "can happen at anytime" (03/06/2013)  . Sickle cell trait ([place])   . WPW (Wolff-Parkinson-White syndrome)    [name] 03/06/2013    Patient Active Problem List   Diagnosis Date Noted  . Leukocytosis   . Hospital acquired PNA 05/20/2015  . Pancreatitis 05/18/2015  . Pseudocyst of pancreas  05/18/2015  . Malnutrition of moderate degree 03/27/2015  . Abnormal transaminases 03/26/2015  . Polysubstance abuse (tobacco, cocaine, THC, and ETOH) 03/26/2015  . Acute pancreatitis 03/26/2015  . Alcohol-induced chronic pancreatitis ([place])   . Essential hypertension 02/06/2014  . Alcohol-induced acute pancreatitis 11/28/2013  . Pancreatic pseudocyst/cyst 11/25/2013  . Alcohol dependence ([place]) 10/23/2013  . 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 Protein-calorie malnutrition, severe ([place]) 10/10/2013  . Suicide attempt 10/08/2013  . Overdose 10/06/2013  . 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 TOBACCO ABUSE 03/23/2007    Past Surgical History:  Procedure Laterality Date  . CARDIAC CATHETERIZATION    . EYE SURGERY Left 1990's   "result of trauma"   . FACIAL FRACTURE SURGERY Left 1990's   "result of trauma"   . FRACTURE SURGERY    . HERNIA REPAIR    . LEFT HEART CATHETERIZATION WITH CORONARY ANGIOGRAM Right 03/07/2013   Procedure: LEFT HEART CATHETERIZATION WITH CORONARY ANGIOGRAM;  Surgeon: [name], MD;  Location: [facility] CATH LAB;  Service: Cardiovascular;  Laterality: Right;  . UMBILICAL HERNIA REPAIR         Home Medications    Prior to Admission medications   Medication Sig Start Date End Date Taking? Authorizing Provider  acetaminophen-codeine (  TYLENOL #3) 300-30 MG tablet Take 2 tablets by mouth every 4 (four) hours as needed for moderate pain.    Historical Provider, MD  albuterol (PROVENTIL HFA;VENTOLIN HFA) 108 (90 BASE) MCG/ACT inhaler Inhale 2 puffs into the lungs every 6 (six) hours as needed for wheezing or shortness of breath (wheezing).     Historical Provider, MD  amoxicillin (AMOXIL) 500 MG capsule Take 500 mg by mouth 3 (three) times daily.    Historical Provider, MD  cholestyramine (QUESTRAN) 4 g packet Take 1 packet (4 g total) by mouth 3 (three) times daily as needed  (diarrhea). [id number]   Delora Fuel, MD  clindamycin (CLEOCIN) 300 MG capsule Take 1 capsule (300 mg total) by mouth 3 (three) times daily. Patient not taking: Reported on 04/24/2016 02/21/16   Kristen N Ward, DO  diphenoxylate-atropine (LOMOTIL) 2.5-0.025 MG tablet Take 1 tablet by mouth 4 (four) times daily as needed for diarrhea or loose stools. Patient not taking: Reported on 04/24/2016 12/04/15   [name], PA-C  fluticasone (FLONASE) 50 MCG/ACT nasal spray Place 1 spray into both nostrils daily as needed for allergies.     Historical Provider, MD  lipase/protease/amylase (CREON) 12000 UNITS CPEP capsule Take 2 capsules (24,000 Units total) by mouth 3 (three) times daily with meals. 01/15/15   [name], MD  metoprolol tartrate (LOPRESSOR) 25 MG tablet Take 1 tablet (25 mg total) by mouth 2 (two) times daily. 12/14/15   Tiffany Daneil Dan, PA-C  omeprazole (PRILOSEC) 20 MG capsule Take 1 capsule (20 mg total) by mouth daily. [id number]   Delora Fuel, MD  ondansetron (ZOFRAN ODT) 4 MG disintegrating tablet Take 1 tablet (4 mg total) by mouth every 8 (eight) hours as needed for nausea or vomiting. Patient not taking: Reported on 04/24/2016 04/17/16   Deno Etienne, DO  ondansetron (ZOFRAN ODT) 8 MG disintegrating tablet Take 1 tablet (8 mg total) by mouth every 8 (eight) hours as needed for nausea or vomiting. 01/25/16   [name], MD  pantoprazole (PROTONIX) 20 MG tablet Take 2 tablets (40 mg total) by mouth daily. Patient not taking: Reported on 04/24/2016 04/11/16   Merryl Hacker, MD  QUEtiapine (SEROQUEL) 100 MG tablet Take 150 mg by mouth at bedtime.  01/15/15   Historical Provider, MD  sertraline (ZOLOFT) 25 MG tablet Take 75 mg by mouth daily.    Historical Provider, MD  sildenafil (VIAGRA) 50 MG tablet Take 1 tablet (50 mg total) by mouth daily as needed for erectile dysfunction. 12/14/15   Brayton Caves, PA-C  sucralfate (CARAFATE) 1 g tablet Take 1 tablet (1 g total) by mouth 4  (four) times daily -  with meals and at bedtime. 11/15/15   [place] Lions, PA-C    Family History Family History  Problem Relation Age of Onset  . Hypertension Other   . Coronary artery disease Other     Social History Social History  Substance Use Topics  . Smoking status: Current Every Day Smoker    Packs/day: 1.00    Years: 30.00    Types: Cigarettes  . Smokeless tobacco: Current User    Types: Chew  . Alcohol use 7.2 oz/week    12 Cans of beer per week     Allergies   Shellfish-derived products; Trazodone and nefazodone; and Reglan [metoclopramide]   Review of Systems Review of Systems  Constitutional: Negative for fever.  Respiratory: Negative for shortness of breath.   Cardiovascular: Negative for chest pain.  Gastrointestinal:  Negative for abdominal pain, nausea and vomiting.  Musculoskeletal: Positive for myalgias.  All other systems reviewed and are negative.    Physical Exam Updated Vital Signs BP 118/81   Pulse 73   Temp 97.6 F (36.4 C) (Oral)   Resp 18   Ht 5\' 9"  (1.753 m)   Wt 150 lb (68 kg)   SpO2 97%   BMI 22.15 kg/m   Physical Exam  Constitutional: He is oriented to person, place, and time. He appears well-developed and well-nourished.  HENT:  Head: Normocephalic and atraumatic.  Cardiovascular: Normal rate and regular rhythm.   Pulmonary/Chest: Effort normal and breath sounds normal. No respiratory distress. He has no wheezes.  Abdominal: Soft. There is no tenderness.  Musculoskeletal: He exhibits no edema.  Normal range of motion of the right shoulder and elbow, no spasms noted, no tremor noted, 2+ radial pulse bilaterally  Lymphadenopathy:    He has no cervical adenopathy.  Neurological: He is alert and oriented to person, place, and time.  5 out of 5 strength all 4 extremities, no dysmetria to finger-nose-finger, no tremor noted  Skin: Skin is warm and dry.  Psychiatric: He has a normal mood and affect.  Nursing note and  vitals reviewed.    ED Treatments / Results  Labs (all labs ordered are listed, but only abnormal results are displayed) Labs Reviewed  I-STAT CHEM 8, ED - Abnormal; Notable for the following:       Result Value   Hemoglobin 12.6 (*)    HCT 37.0 (*)    All other components within normal limits    EKG  EKG Interpretation None       Radiology No results found.  Procedures Procedures (including critical care time)  Medications Ordered in ED Medications  thiamine (VITAMIN B-1) tablet 100 mg (100 mg Oral Given 04/26/16 0615)  LORazepam (ATIVAN) tablet 1 mg (1 mg Oral Given 04/26/16 0615)     Initial Impression / Assessment and Plan / ED Course  I have reviewed the triage vital signs and the nursing notes.  Pertinent labs & imaging results that were available during my care of the patient were reviewed by me and considered in my medical decision making (see chart for details).  Clinical Course     Patient presents with what he describes as muscle spasms of the right arm and leg. He is nontoxic. Vital signs reassuring. No evidence of withdrawal. Chem-8 obtained to evaluate for metabolic derangement. This is reassuring. Patient given thiamine and by mouth Ativan. Exam remains reassuring. Follow-up with primary physician recommended.  After history, exam, and medical workup I feel the patient has been appropriately medically screened and is safe for discharge home. Pertinent diagnoses were discussed with the patient. Patient was given return precautions.   Final Clinical Impressions(s) / ED Diagnoses   Final diagnoses:  Muscle spasm    New Prescriptions New Prescriptions   No medications on file     Merryl Hacker, MD 04/26/16 [phone]

## 2016-05-03 ENCOUNTER — Encounter (HOSPITAL_COMMUNITY): Payer: Self-pay

## 2016-05-03 ENCOUNTER — Emergency Department (HOSPITAL_COMMUNITY)
Admission: EM | Admit: 2016-05-03 | Discharge: 2016-05-04 | Disposition: A | Discharge status: Home / Self Care | Payer: Self-pay | Attending: Emergency Medicine | Admitting: Emergency Medicine

## 2016-05-03 DIAGNOSIS — R109 Unspecified abdominal pain: Secondary | ICD-10-CM

## 2016-05-03 DIAGNOSIS — G8929 Other chronic pain: Secondary | ICD-10-CM | POA: Insufficient documentation

## 2016-05-03 DIAGNOSIS — R1013 Epigastric pain: Secondary | ICD-10-CM | POA: Insufficient documentation

## 2016-05-03 DIAGNOSIS — I10 Essential (primary) hypertension: Secondary | ICD-10-CM | POA: Insufficient documentation

## 2016-05-03 DIAGNOSIS — Z79899 Other long term (current) drug therapy: Secondary | ICD-10-CM | POA: Insufficient documentation

## 2016-05-03 MED ORDER — ONDANSETRON 4 MG PO TBDP
4.0000 mg | ORAL_TABLET | Freq: Once | ORAL | Status: AC
  Administered 2016-05-04: 4 mg via ORAL
  Filled 2016-05-03: qty 1

## 2016-05-03 MED ORDER — GI COCKTAIL ~~LOC~~
30.0000 mL | Freq: Once | ORAL | Status: AC
  Administered 2016-05-03: 30 mL via ORAL
  Filled 2016-05-03: qty 30

## 2016-05-03 MED ORDER — SUCRALFATE 1 G PO TABS
1.0000 g | ORAL_TABLET | Freq: Once | ORAL | Status: AC
  Administered 2016-05-04: 1 g via ORAL
  Filled 2016-05-03: qty 1

## 2016-05-03 NOTE — ED Triage Notes (Signed)
Pt presents for recurrent abd pain x 2 hours. Pt. Denies injury. Pt. Ambulatory/AxO x4.

## 2016-05-03 NOTE — ED Provider Notes (Signed)
[facility] DEPT Provider Note   CSN: [id number] Arrival date & time: 05/03/16  1702     History   Chief Complaint Chief Complaint  Patient presents with  . Abdominal Pain    HPI [name] is a 45 y.o. male.  HPI 45 year old male with extensive past medical history as below including chronic polysubstance abuse and chronic abdominal pain, well-known to the ED, who presents with abdominal pain. Patient states that he had a small amount of alcohol last night and since then has had abdominal pain throughout the day today. He describes the pain as an aching, gnawing, throbbing sensation in his upper abdomen. The pain is similar to his chronic abdominal pain. He has been able to eat and drink today but did have mild worsening of his pain when he tried to eat lunch. No vomiting. No fevers or chills. No rash. No other medication changes.  Past Medical History:  Diagnosis Date  . Alcoholism /alcohol abuse ([place])   . Anemia   . Anxiety   . Arthritis    "knees; arms; elbows" (03/26/2015)  . Asthma   . Bipolar disorder ([place])   . Chronic bronchitis ([place])   . Chronic lower back pain   . Cocaine abuse   . Depression   . Family history of adverse reaction to anesthesia    "grandmother gets confused"  . Femoral condyle fracture ([place]) 03/08/2014   left medial/notes 03/09/2014  . GERD (gastroesophageal reflux disease)   . H/O hiatal hernia   . H/O suicide attempt 10/2012  . Heart murmur    "when he was little" (03/06/2013)  . High cholesterol   . History of blood transfusion 10/2012   "when I tried to commit suicide"  . History of stomach ulcers   . Hypertension   . Marijuana abuse, continuous   . Migraine    "a few times/year" (03/26/2015)  . Pancreatitis   . Pneumonia 1990's X 3  . PTSD (post-traumatic stress disorder)   . Shortness of breath    "can happen at anytime" (03/06/2013)  . Sickle cell trait ([place])   . WPW (Wolff-Parkinson-White syndrome)    [name] 03/06/2013     Patient Active Problem List   Diagnosis Date Noted  . Leukocytosis   . Hospital acquired PNA 05/20/2015  . Pancreatitis 05/18/2015  . Pseudocyst of pancreas 05/18/2015  . Malnutrition of moderate degree 03/27/2015  . Abnormal transaminases 03/26/2015  . Polysubstance abuse (tobacco, cocaine, THC, and ETOH) 03/26/2015  . Acute pancreatitis 03/26/2015  . Alcohol-induced chronic pancreatitis ([place])   . Essential hypertension 02/06/2014  . Alcohol-induced acute pancreatitis 11/28/2013  . Pancreatic pseudocyst/cyst 11/25/2013  . Alcohol dependence ([place]) 10/23/2013  . MDD (major depressive disorder), recurrent severe, without psychosis ([place]) 10/22/2013  . Protein-calorie malnutrition, severe ([place]) 10/10/2013  . Suicide attempt 10/08/2013  . Overdose 10/06/2013  . 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 TOBACCO ABUSE 03/23/2007    Past Surgical History:  Procedure Laterality Date  . CARDIAC CATHETERIZATION    . EYE SURGERY Left 1990's   "result of trauma"   . FACIAL FRACTURE SURGERY Left 1990's   "result of trauma"   . FRACTURE SURGERY    . HERNIA REPAIR    . LEFT HEART CATHETERIZATION WITH CORONARY ANGIOGRAM Right 03/07/2013   Procedure: LEFT HEART CATHETERIZATION WITH CORONARY ANGIOGRAM;  Surgeon: [name], MD;  Location: [facility] CATH [facility];  Service: Cardiovascular;  Laterality: Right;  . UMBILICAL HERNIA REPAIR  Home Medications    Prior to Admission medications   Medication Sig Start Date End Date Taking? Authorizing Provider  acetaminophen-codeine (TYLENOL #3) 300-30 MG tablet Take 2 tablets by mouth every 4 (four) hours as needed for moderate pain.    Historical Provider, MD  albuterol (PROVENTIL HFA;VENTOLIN HFA) 108 (90 BASE) MCG/ACT inhaler Inhale 2 puffs into the lungs every 6 (six) hours as needed for wheezing or shortness of breath (wheezing).     Historical Provider, MD  amoxicillin (AMOXIL) 500 MG capsule Take 500 mg by  mouth 3 (three) times daily.    Historical Provider, MD  cholestyramine (QUESTRAN) 4 g packet Take 1 packet (4 g total) by mouth 3 (three) times daily as needed (diarrhea). [id number]   Delora Fuel, MD  clindamycin (CLEOCIN) 300 MG capsule Take 1 capsule (300 mg total) by mouth 3 (three) times daily. Patient not taking: Reported on 04/24/2016 02/21/16   Kristen N Ward, DO  diphenoxylate-atropine (LOMOTIL) 2.5-0.025 MG tablet Take 1 tablet by mouth 4 (four) times daily as needed for diarrhea or loose stools. Patient not taking: Reported on 04/24/2016 12/04/15   [name], PA-C  fluticasone (FLONASE) 50 MCG/ACT nasal spray Place 1 spray into both nostrils daily as needed for allergies.     Historical Provider, MD  lipase/protease/amylase (CREON) 12000 UNITS CPEP capsule Take 2 capsules (24,000 Units total) by mouth 3 (three) times daily with meals. 01/15/15   [name], MD  metoprolol tartrate (LOPRESSOR) 25 MG tablet Take 1 tablet (25 mg total) by mouth 2 (two) times daily. 12/14/15   Tiffany Daneil Dan, PA-C  omeprazole (PRILOSEC) 20 MG capsule Take 1 capsule (20 mg total) by mouth daily. [id number]   Delora Fuel, MD  ondansetron (ZOFRAN ODT) 4 MG disintegrating tablet Take 1 tablet (4 mg total) by mouth every 8 (eight) hours as needed for nausea or vomiting. Patient not taking: Reported on 04/24/2016 04/17/16   Deno Etienne, DO  ondansetron (ZOFRAN ODT) 8 MG disintegrating tablet Take 1 tablet (8 mg total) by mouth every 8 (eight) hours as needed for nausea or vomiting. 01/25/16   [name], MD  pantoprazole (PROTONIX) 20 MG tablet Take 2 tablets (40 mg total) by mouth daily. Patient not taking: Reported on 04/24/2016 04/11/16   Merryl Hacker, MD  QUEtiapine (SEROQUEL) 100 MG tablet Take 150 mg by mouth at bedtime.  01/15/15   Historical Provider, MD  sertraline (ZOLOFT) 25 MG tablet Take 75 mg by mouth daily.    Historical Provider, MD  sildenafil (VIAGRA) 50 MG tablet Take 1 tablet (50 mg  total) by mouth daily as needed for erectile dysfunction. 12/14/15   Brayton Caves, PA-C  sucralfate (CARAFATE) 1 g tablet Take 1 tablet (1 g total) by mouth 4 (four) times daily -  with meals and at bedtime. 11/15/15   Carson Lions, PA-C    Family History Family History  Problem Relation Age of Onset  . Hypertension Other   . Coronary artery disease Other     Social History Social History  Substance Use Topics  . Smoking status: Current Every Day Smoker    Packs/day: 1.00    Years: 30.00    Types: Cigarettes  . Smokeless tobacco: Current User    Types: Chew  . Alcohol use 7.2 oz/week    12 Cans of beer per week     Allergies   Shellfish-derived products; Trazodone and nefazodone; and Reglan [metoclopramide]   Review of Systems Review of  Systems  Constitutional: Negative for chills, fatigue and fever.  HENT: Negative for congestion and rhinorrhea.   Eyes: Negative for visual disturbance.  Respiratory: Negative for cough, shortness of breath and wheezing.   Cardiovascular: Negative for chest pain and leg swelling.  Gastrointestinal: Positive for abdominal pain, nausea and vomiting. Negative for abdominal distention and diarrhea.  Genitourinary: Negative for dysuria and flank pain.  Musculoskeletal: Negative for neck pain and neck stiffness.  Skin: Negative for rash and wound.  Allergic/Immunologic: Negative for immunocompromised state.  Neurological: Negative for syncope, weakness and headaches.  All other systems reviewed and are negative.    Physical Exam Updated Vital Signs BP 147/97   Pulse 79   Temp 98 F (36.7 C) (Oral)   Resp 18   SpO2 100%   Physical Exam  Constitutional: He is oriented to person, place, and time. He appears well-developed and well-nourished. No distress.  HENT:  Head: Normocephalic and atraumatic.  Mouth/Throat: Oropharynx is clear and moist. No oropharyngeal exudate.  Eyes: Conjunctivae are normal.  Neck: Normal range of  motion. Neck supple.  Cardiovascular: Normal rate, regular rhythm and normal heart sounds.  Exam reveals no friction rub.   No murmur heard. Pulmonary/Chest: Effort normal and breath sounds normal. No respiratory distress. He has no wheezes. He has no rales.  Abdominal: Soft. Bowel sounds are normal. He exhibits no distension. There is tenderness (minimal, epigastric, distractible). There is no rebound and no guarding.  Musculoskeletal: He exhibits no edema.  Neurological: He is alert and oriented to person, place, and time. He exhibits normal muscle tone.  Skin: Skin is warm. Capillary refill takes less than 2 seconds.  Psychiatric: He has a normal mood and affect.  Nursing note and vitals reviewed.    ED Treatments / Results  Labs (all labs ordered are listed, but only abnormal results are displayed) Labs Reviewed - No data to display  EKG  EKG Interpretation None       Radiology No results found.  Procedures Procedures (including critical care time)  Medications Ordered in ED Medications  gi cocktail (Maalox,Lidocaine,Donnatal) (30 mLs Oral Given 05/03/16 2309)  sucralfate (CARAFATE) tablet 1 g (1 g Oral Given 05/04/16 0006)  ondansetron (ZOFRAN-ODT) disintegrating tablet 4 mg (4 mg Oral Given 05/04/16 0005)     Initial Impression / Assessment and Plan / ED Course  I have reviewed the triage vital signs and the nursing notes.  Pertinent labs & imaging results that were available during my care of the patient were reviewed by me and considered in my medical decision making (see chart for details).  Clinical Course     45 yo M with PMHx as above, including chronic pancreatitis, chronic abdominal pain here with mild abdominal pain after drinking EtOH last night. On arrival, VSS and WNL. Exam is overall very reassuring and abdomen is soft, NT, and ND. Pt is tolerating PO prior to my assessment and sx improved with GI cocktail in ED.   I have reviewed recent labs,  records, and ED visits. Pt has extensive h/o same with normal labs with Care Plan in place. Given well appearance, stable vitals, tolerance of PO, and similarity to chronic complaints with no new or concerning sx, do not feel labs/imaging indicated. Will d/c with supportive care  Final Clinical Impressions(s) / ED Diagnoses   Final diagnoses:  Chronic abdominal pain    New Prescriptions Discharge Medication List as of 05/04/2016 12:00 AM       [name], MD 05/04/16  1129  

## 2016-05-04 NOTE — ED Notes (Signed)
Pt given ginger ale for fluid challenge, per pt.

## 2016-05-09 IMAGING — CR DG ABDOMEN ACUTE W/ 1V CHEST
3 series · 3 of 3 positions shown · non-contrast
Comparison: Chest radiograph 10/17/2013. Abdominal radiograph
10/14/2013.

CLINICAL DATA: Abdominal pain. Left lower quadrant pain in mid
sternal chest pain.

EXAM:
ACUTE ABDOMEN SERIES (ABDOMEN 2 VIEW & CHEST 1 VIEW)

[w chest pa]
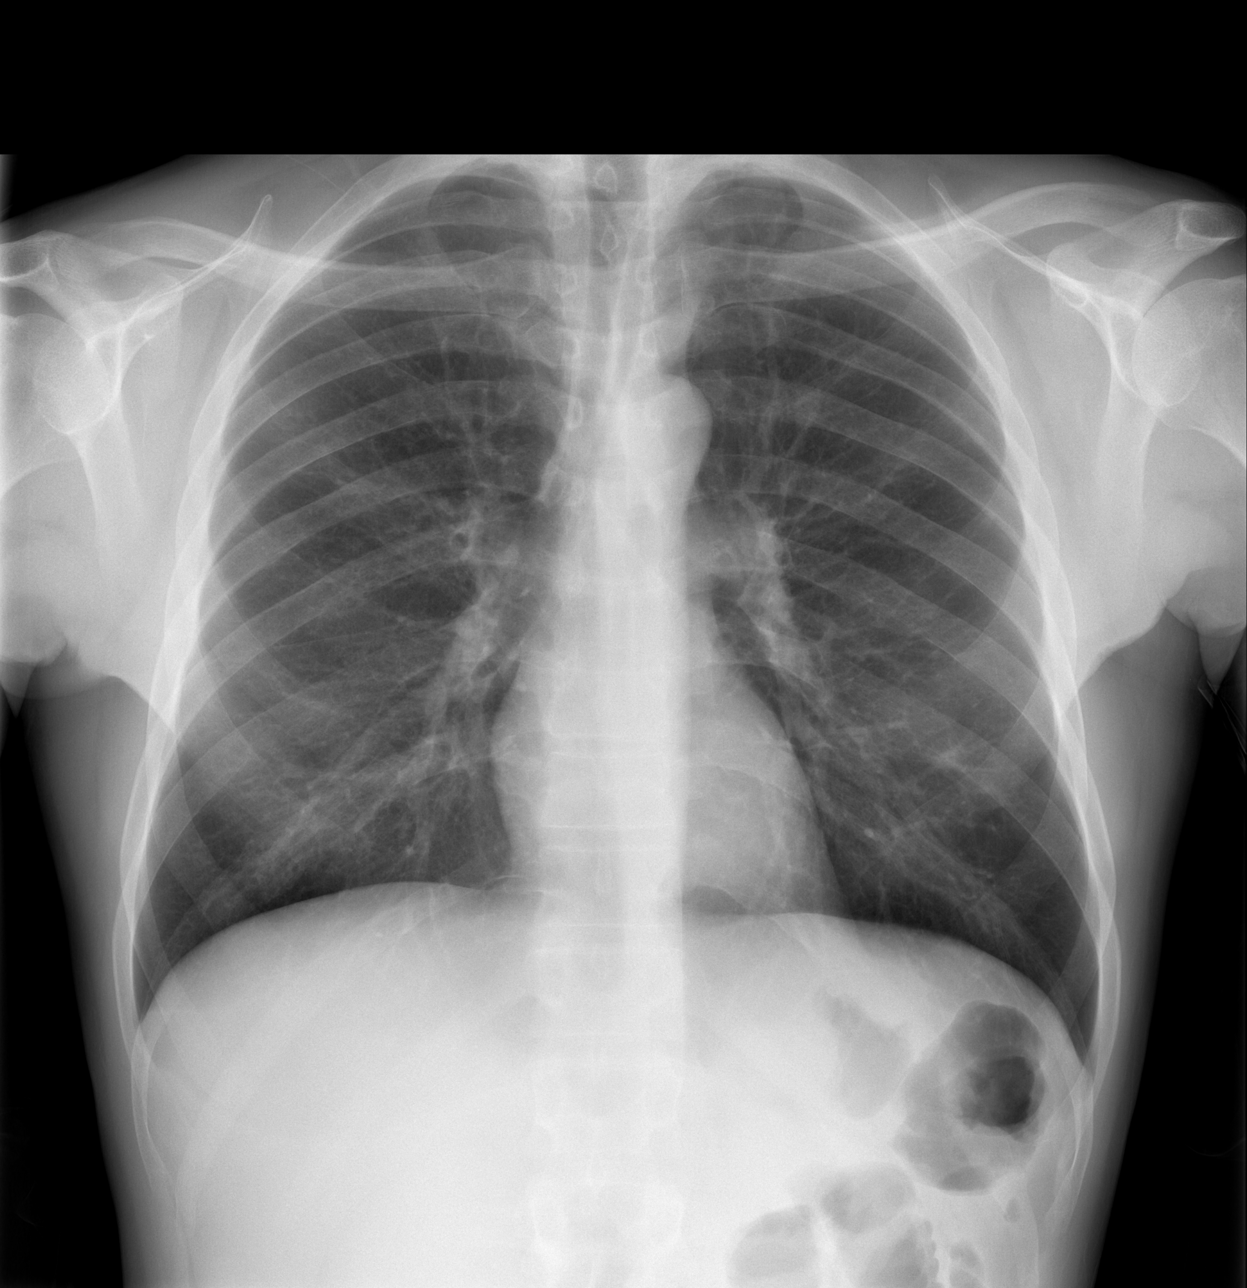

[w abdomen upright]
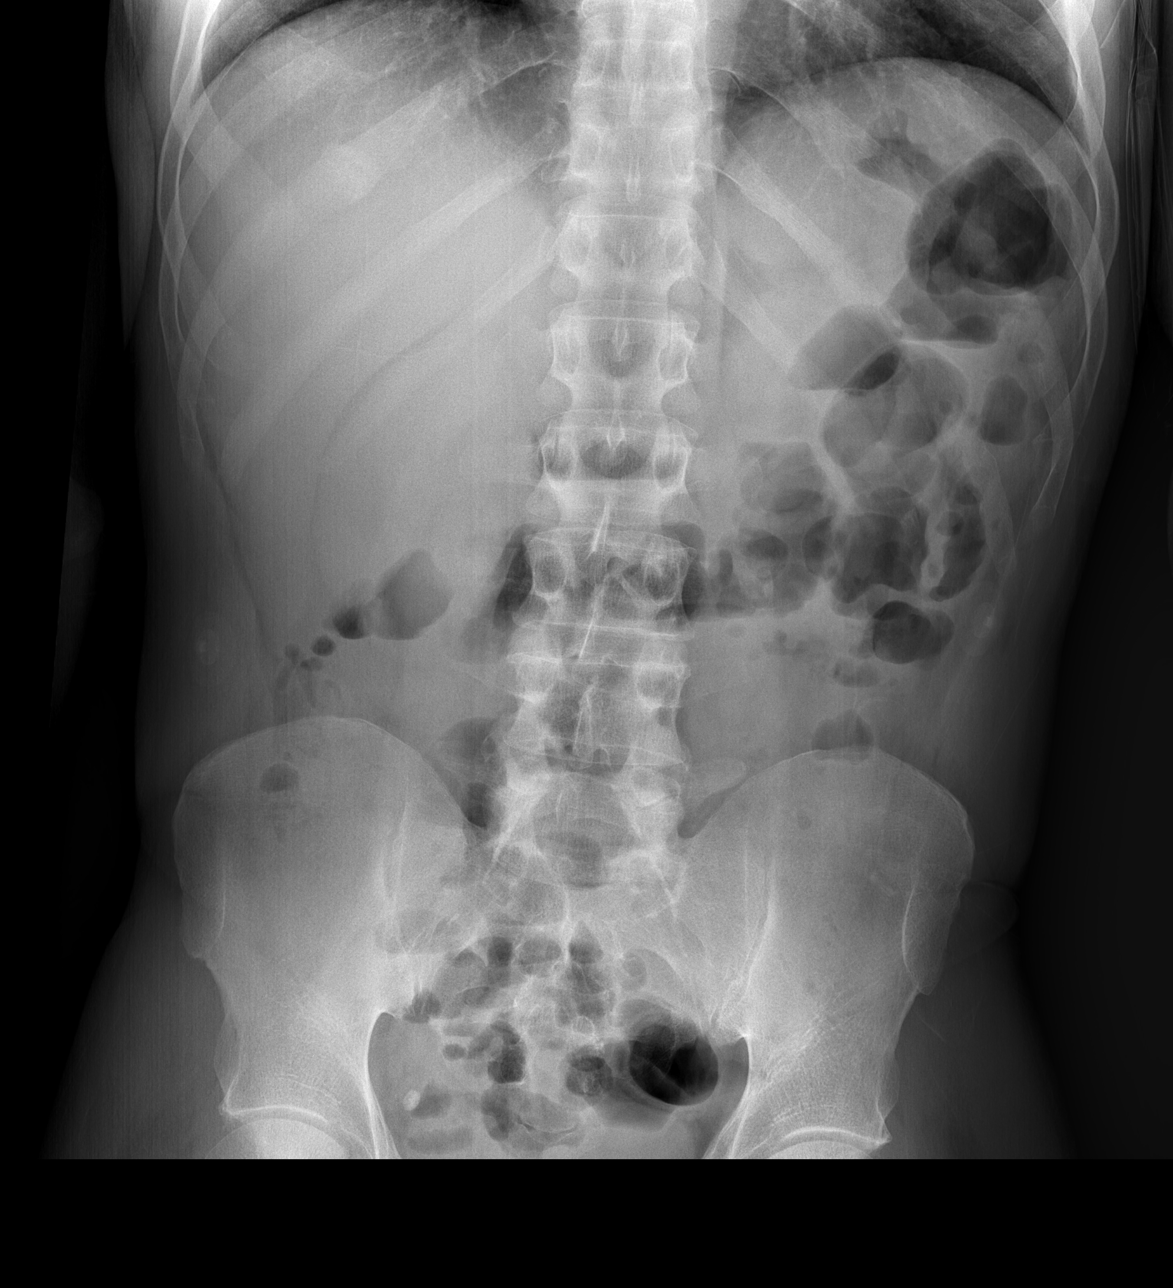

[t abdomen supine]
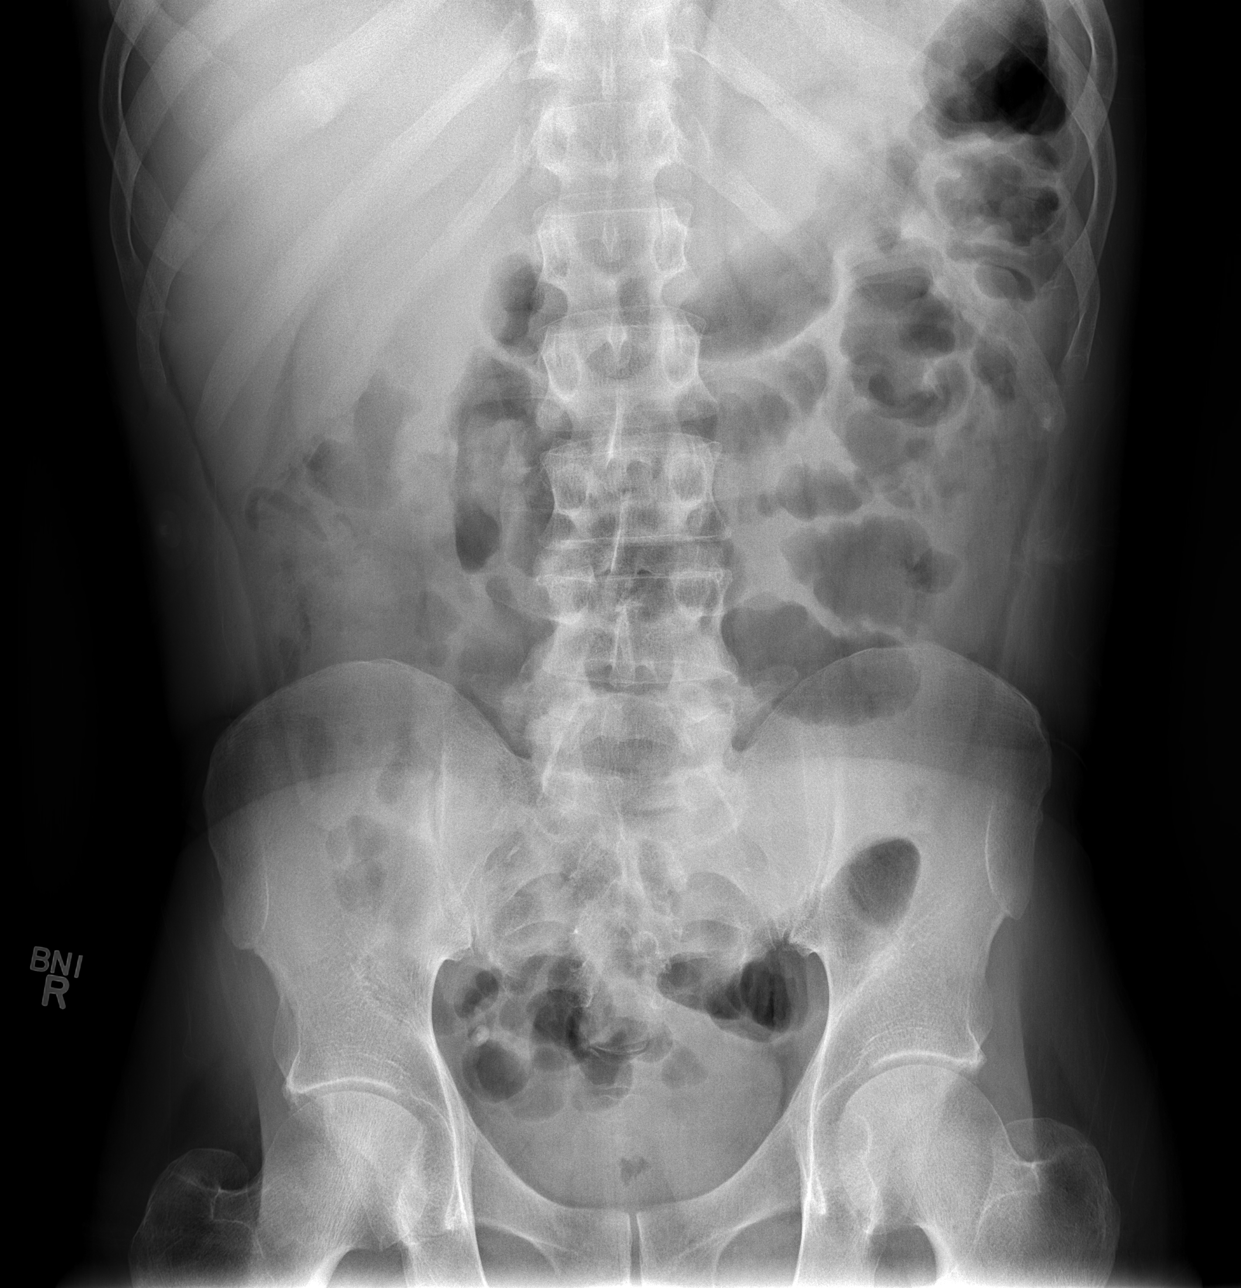

[3 of 3 positions shown; findings below may reference images not displayed]

FINDINGS: There is no evidence of dilated bowel loops or free intraperitoneal
air. No radiopaque calculi or significant stool burden. No abdominal
mass effect. There is a focal area of sclerosis and possible central
lucency associated with the right tenth rib.

The heart. mediastinal, and hilar contours are normal. The lungs are
well expanded and clear. The trachea is midline. There is mild
gaseous distention of the upper thoracic esophagus.
IMPRESSION: 1. No acute cardiopulmonary disease. Nonobstructive bowel gas
pattern.
2. Focal sclerosis associated with the right tenth rib. Question if
this could reflect a healing rib fracture.

## 2016-05-12 IMAGING — MR MR ABDOMEN WO/W CM
13 of 23 series · 19 of 48 positions shown · IV contrast (Yes   12)
Comparison: Abdomen ultrasound on 11/18/2013 and prior CT on
08/17/2012

CLINICAL DATA: Acute pancreatitis. Indeterminate pancreatic lesion
seen on recent ultrasound.

EXAM:
MRI ABDOMEN WITHOUT AND WITH CONTRAST
TECHNIQUE: Multiplanar multisequence MR imaging of the abdomen was performed
both before and after the administration of intravenous contrast.
CONTRAST:  12mL MULTIHANCE GADOBENATE DIMEGLUMINE 529 MG/ML IV SOLN

[Series 5: cor ssfse nav · coronal · 6.0mm · 0.78mm/px · 1 of 29 slices shown]
[im 1/29]
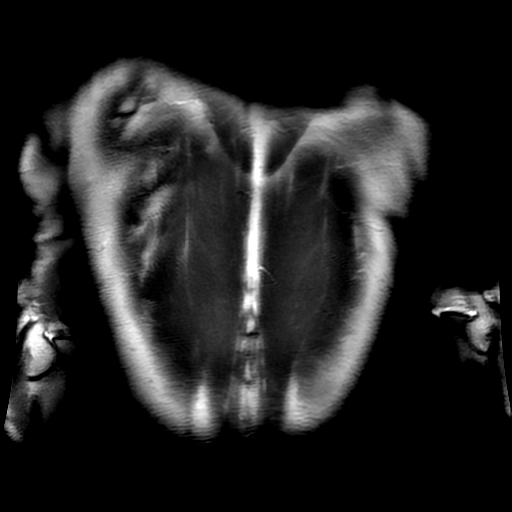

[Series 7: ax ssfse nav · axial · 6.0mm · 0.66mm/px · 1 of 37 slices shown]
[im 1/37]
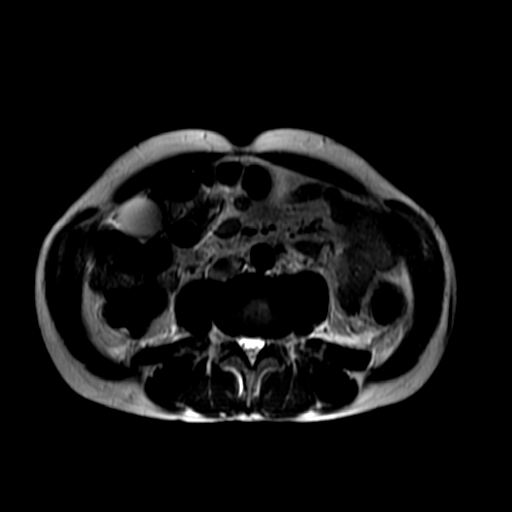

[Series 9: DWI b500 · axial · 8.0mm · 1.33mm/px · 1 of 61 slices shown]
[im 1/61]
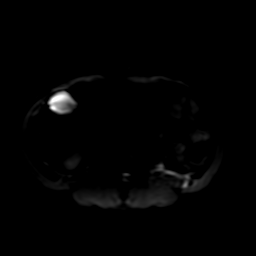

[Series 10: T2 fat-sat · axial · 6.0mm · 0.66mm/px · 1 of 37 slices shown]
[im 1/37]
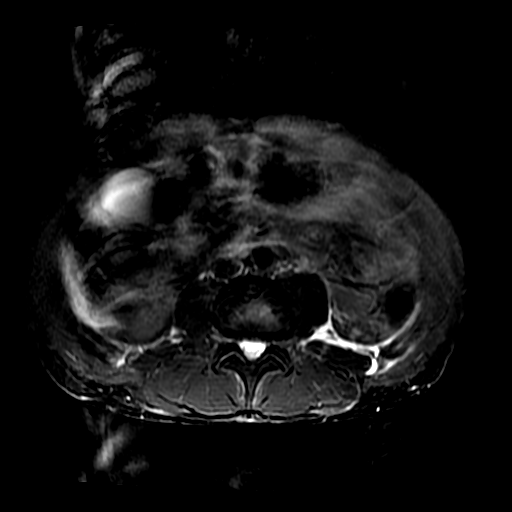

[Series 11: ax fspgr fs · axial · 5.0mm · 0.66mm/px · 1 of 24 slices shown]
[im 1/24]
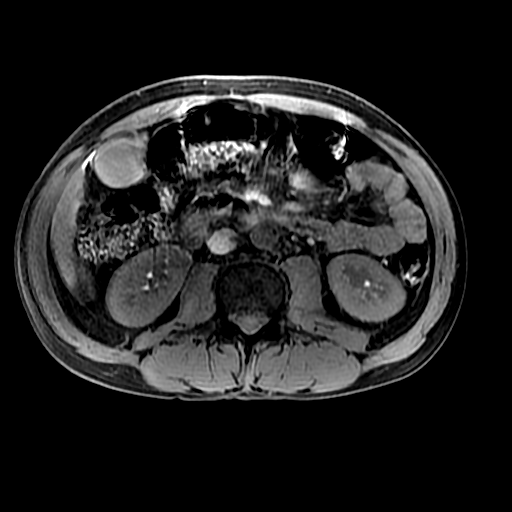

[Series 12: T2 · axial · 6.0mm · 0.66mm/px · 1 of 37 slices shown]
[im 1/37]
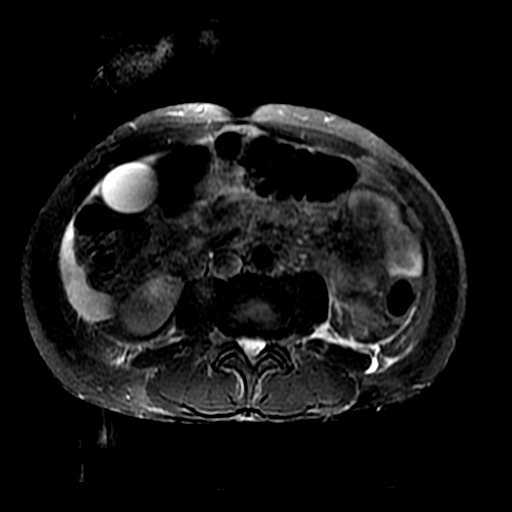

[Series 13: T1 dynamic · axial · non-contrast · 4.6mm · 0.66mm/px · z∈[-32,+186]mm · 2 of 96 slices shown (1 of 4)]
[im 1/96]
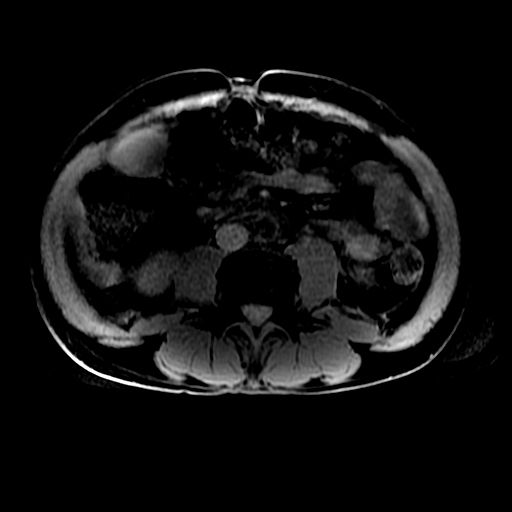
[im 96/96]
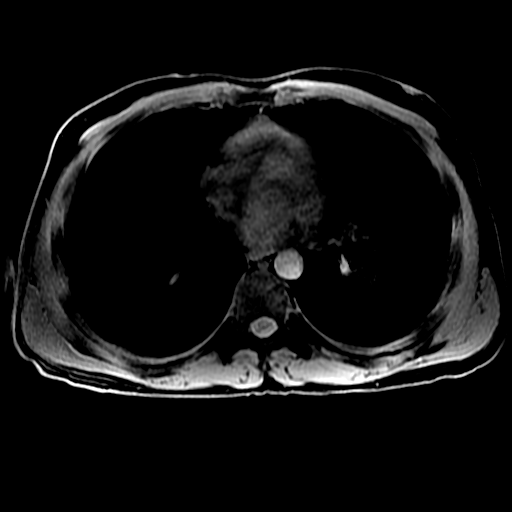

[Series 15: T1 dynamic post-contrast · coronal · 4.6mm · 0.70mm/px · 1 of 88 slices shown (1 of 3)]
[im 1/88]
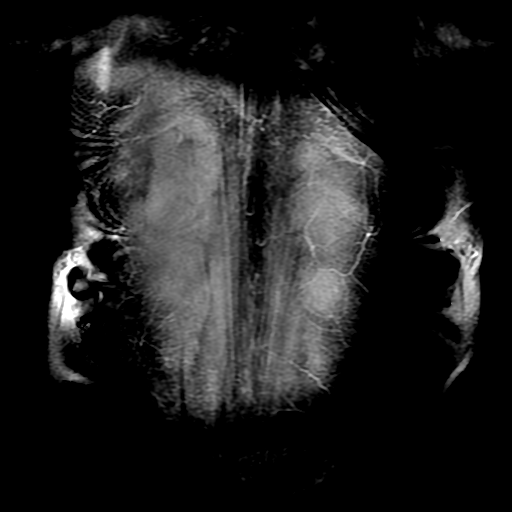

[Series 16: T1 dynamic post-contrast · axial · 4.6mm · 0.66mm/px · z∈[-36,+182]mm · 2 of 96 slices shown (2 of 3)]
[im 1/96]
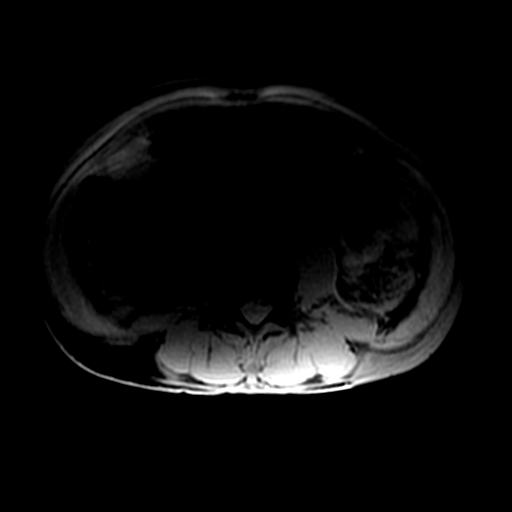
[im 96/96]
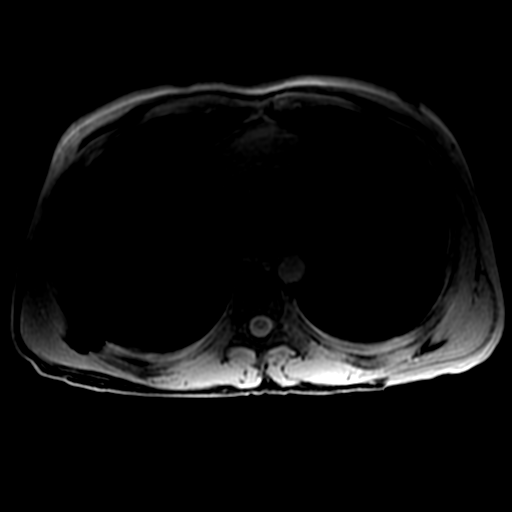

[Series 17: T1 dynamic post-contrast · axial · 4.6mm · 0.66mm/px · z∈[-36,+182]mm · 2 of 96 slices shown (3 of 3)]
[im 1/96]
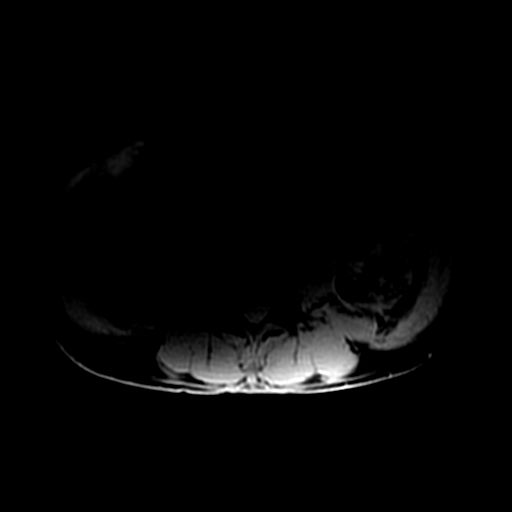
[im 96/96]
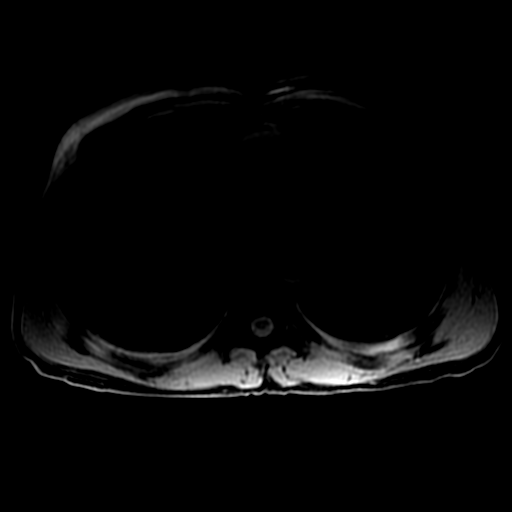

[Series 1301: T1 dynamic · axial · non-contrast · 4.6mm · 0.66mm/px · z∈[-32,+186]mm · 2 of 96 slices shown (2 of 4)]
[im 1/96]
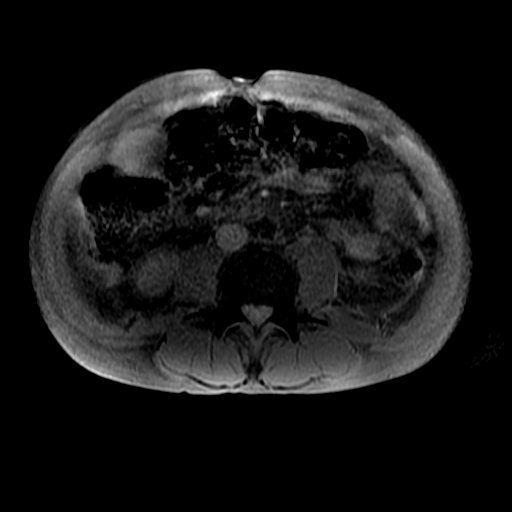
[im 96/96]
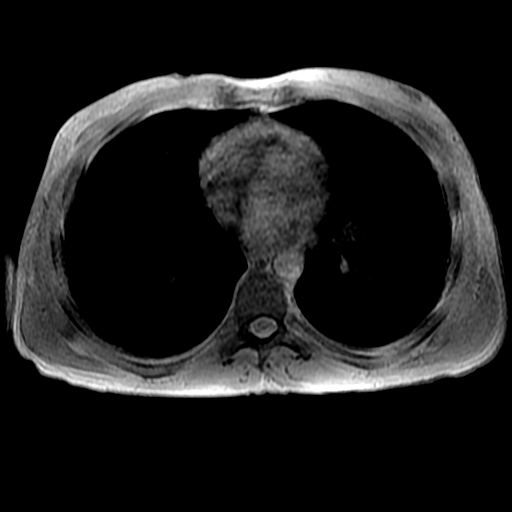

[Series 1302: T1 dynamic · axial · non-contrast · 4.6mm · 0.66mm/px · z∈[-32,+186]mm · 2 of 96 slices shown (3 of 4)]
[im 1/96]
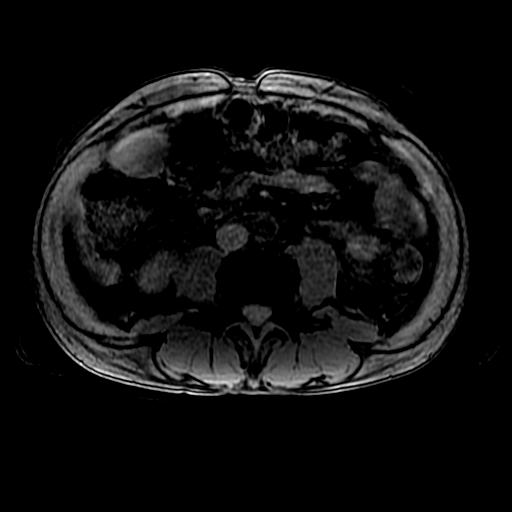
[im 96/96]
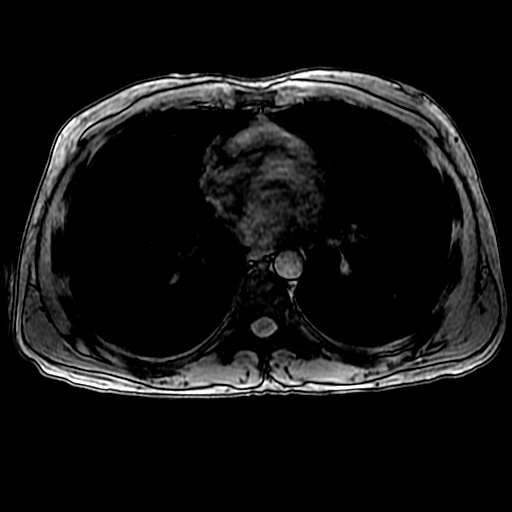

[Series 1400: T1 dynamic · axial · 4.6mm · 0.66mm/px · z∈[-36,+72]mm · 2 of 96 slices shown (4 of 4)]
[im 1/96]
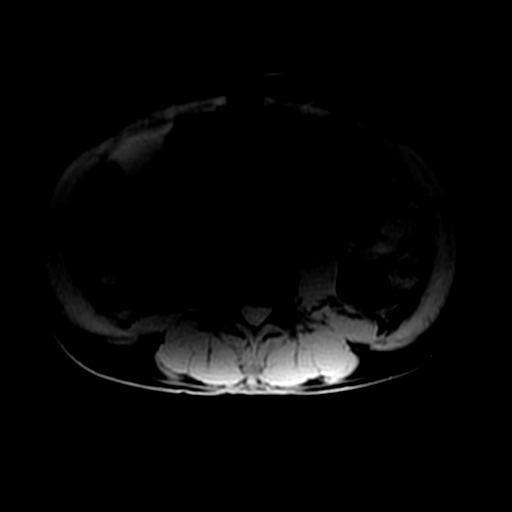
[im 48/96]
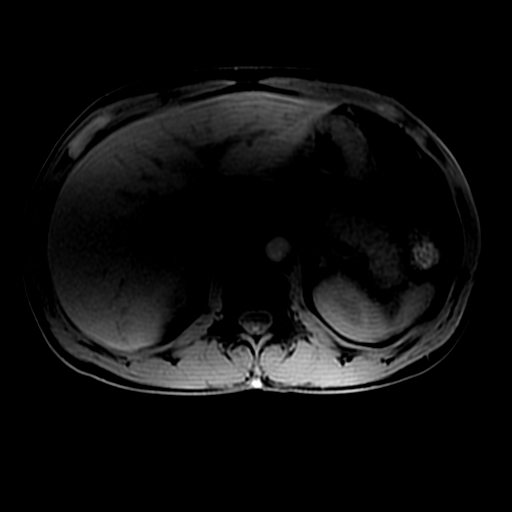

[19 of 48 positions shown; findings below may reference images not displayed]

FINDINGS: Moderate diffuse pancreatic edema and peripancreatic inflammatory
changes are seen, consistent with acute pancreatitis. 2-3 tiny
cystic foci are seen in the pancreatic body measuring up up to 11
mm, and several other tiny less than 1 cm cystic foci are seen in
the pancreatic uncinate process. These most likely represent tiny
pancreatic pseudocysts in the setting of pancreatitis. No solid
pancreatic masses are identified. No evidence of pancreatic ductal
dilatation.

No significant biliary ductal dilatation with common bile duct
measuring 6 mm. Gallbladder is unremarkable. No liver masses are
identified. No evidence of portal or splenic vein thrombosis. No
evidence of splenomegaly. The adrenal glands and kidneys are normal
in appearance. No evidence hydronephrosis Mild ascites noted in both
the right and left upper quadrants.
IMPRESSION: Moderate acute pancreatitis, with mild abdominal ascites.

Several tiny cystic foci in the pancreatic uncinate process and body
measuring up to 1 cm, most likely representing tiny pancreatic
pseudocysts in the setting of pancreatitis. No evidence of
pancreatic mass.

## 2016-05-31 IMAGING — CR DG ABDOMEN ACUTE W/ 1V CHEST
3 series · 3 of 3 positions shown · non-contrast
Comparison: Chest radiograph October 17, 2013; abdominal radiograph

CLINICAL DATA: Pancreatitis; diarrhea

EXAM:
ACUTE ABDOMEN SERIES (ABDOMEN 2 VIEW & CHEST 1 VIEW)

[w chest pa]
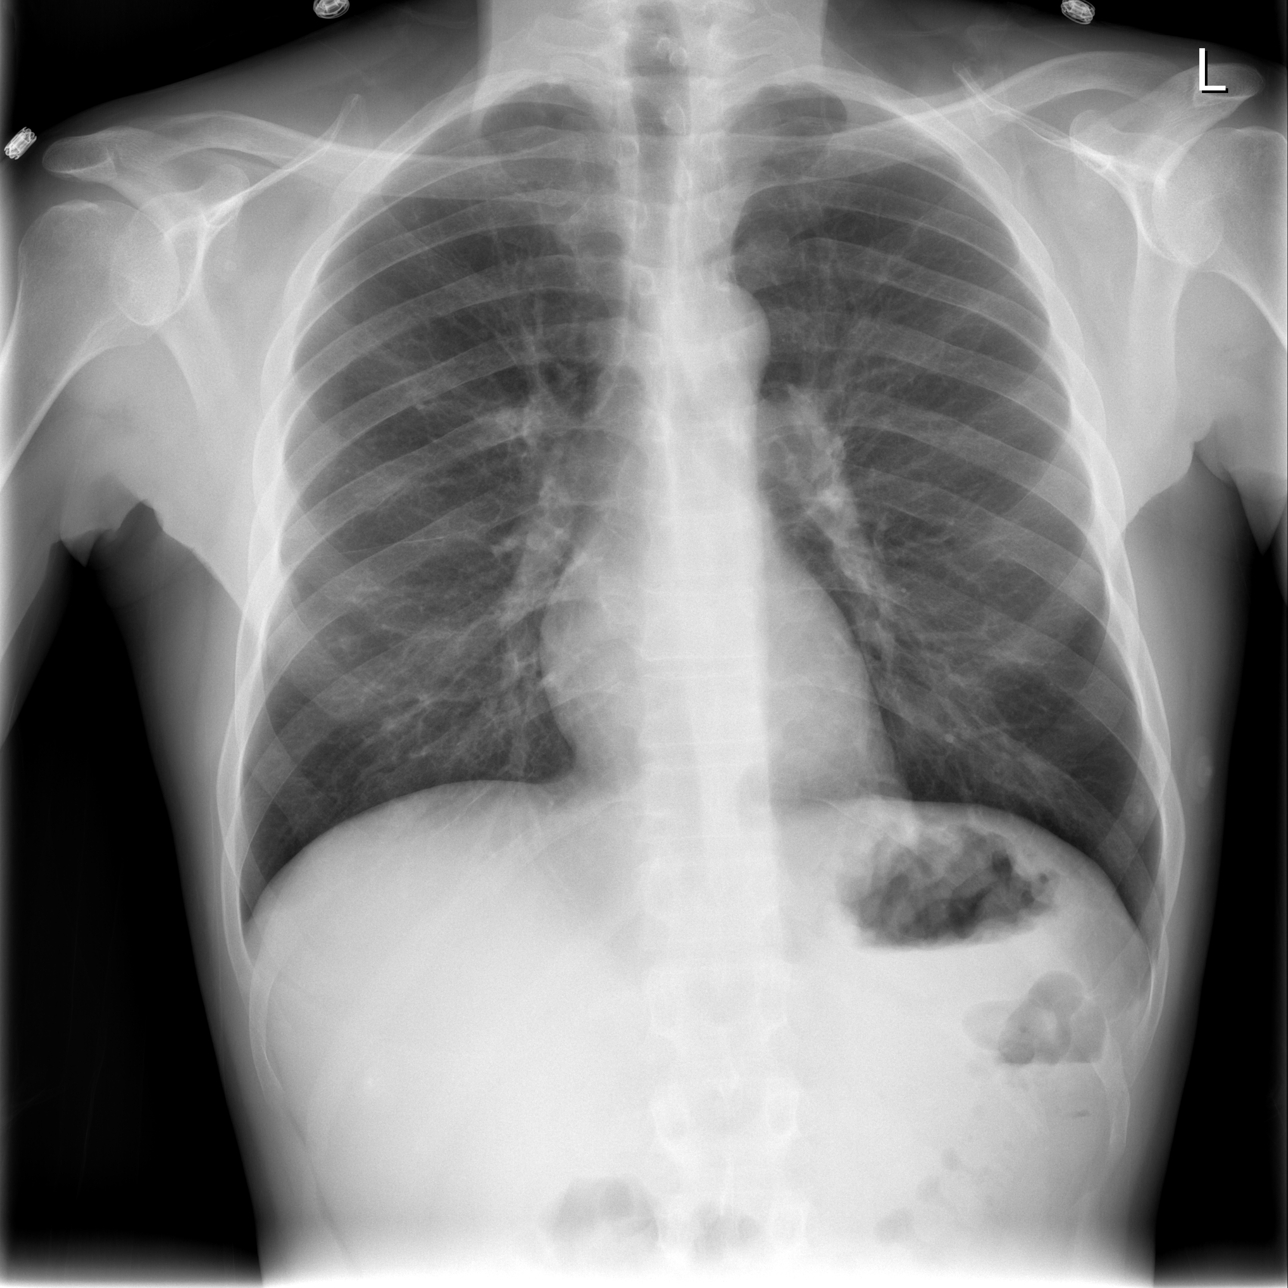

[w abdomen upright]
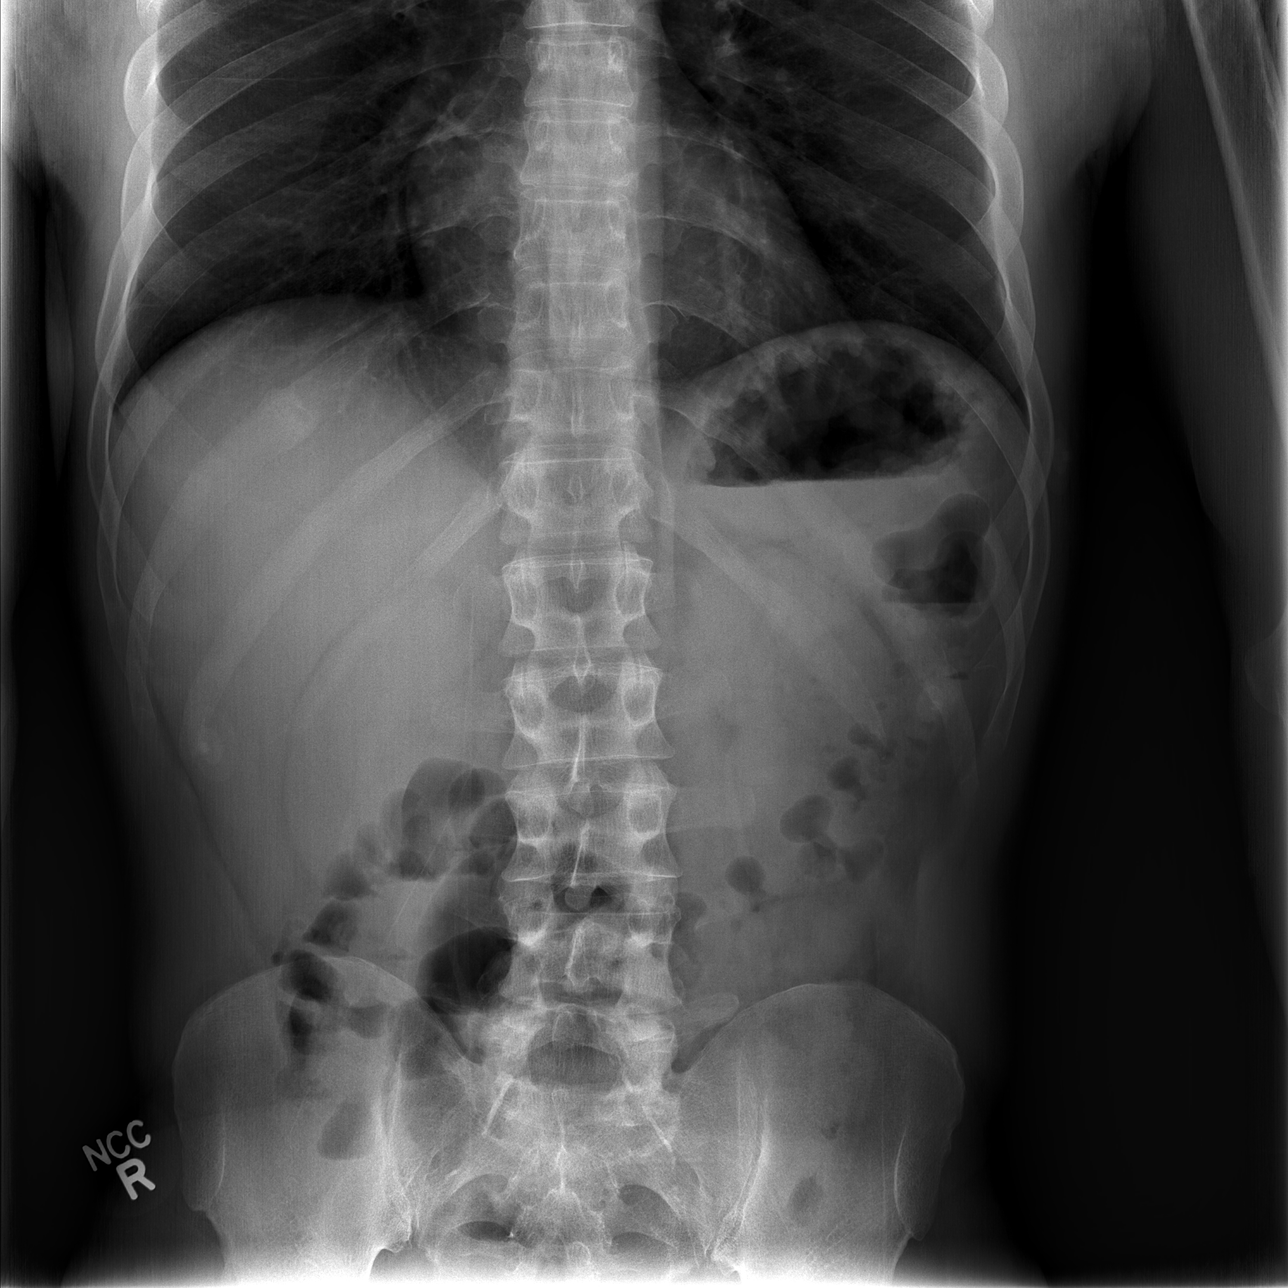

[t abdomen supine]
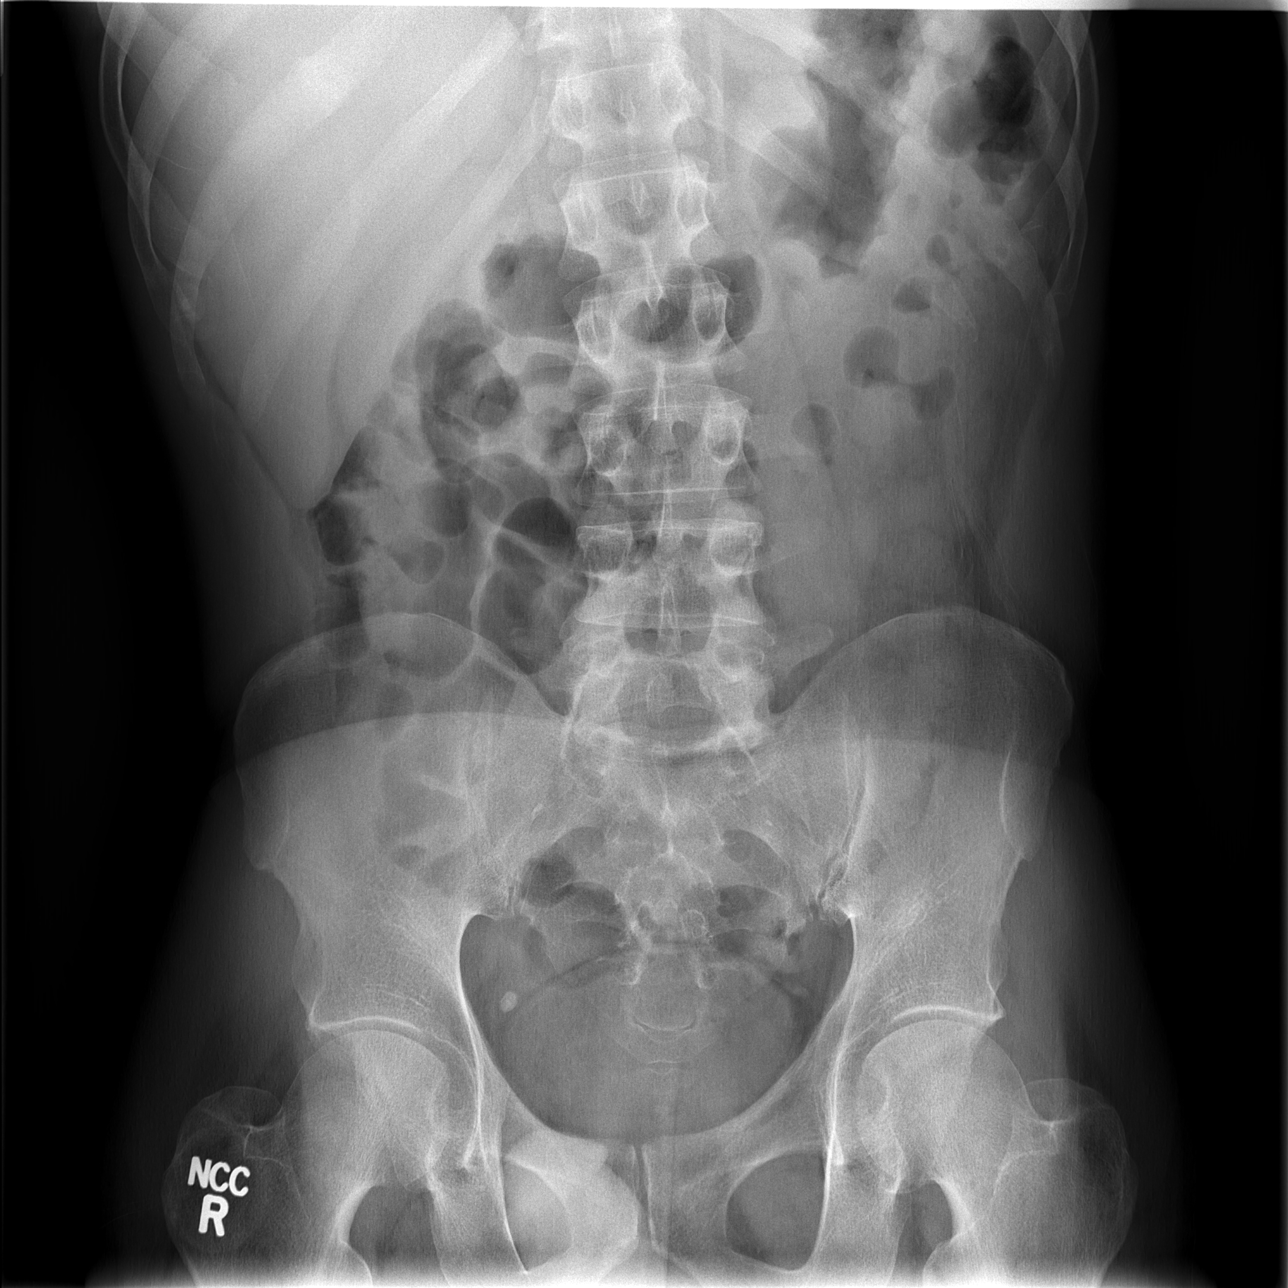

[3 of 3 positions shown; findings below may reference images not displayed]

FINDINGS: Chest radiograph: There is no edema or consolidation. Heart size and
pulmonary vascularity are normal. No adenopathy.

Supine and upright abdomen: Bowel gas pattern is unremarkable. No
obstruction or free air. There is moderate stool in the colon. There
is a phlebolith in the right pelvis. There is a healed fracture of
the posterior right tenth rib.
IMPRESSION: Unremarkable bowel gas pattern.  No lung edema or consolidation.

## 2016-06-08 ENCOUNTER — Other Ambulatory Visit: Payer: Self-pay | Admitting: Licensed Clinical Social Worker

## 2016-06-12 ENCOUNTER — Emergency Department (HOSPITAL_COMMUNITY)
Admission: EM | Admit: 2016-06-12 | Discharge: 2016-06-12 | Disposition: A | Discharge status: Home / Self Care | Payer: Self-pay | Attending: Emergency Medicine | Admitting: Emergency Medicine

## 2016-06-12 ENCOUNTER — Encounter (HOSPITAL_COMMUNITY): Payer: Self-pay

## 2016-06-12 DIAGNOSIS — F17210 Nicotine dependence, cigarettes, uncomplicated: Secondary | ICD-10-CM | POA: Insufficient documentation

## 2016-06-12 DIAGNOSIS — R1013 Epigastric pain: Secondary | ICD-10-CM

## 2016-06-12 DIAGNOSIS — K861 Other chronic pancreatitis: Secondary | ICD-10-CM | POA: Insufficient documentation

## 2016-06-12 DIAGNOSIS — K2900 Acute gastritis without bleeding: Secondary | ICD-10-CM | POA: Insufficient documentation

## 2016-06-12 DIAGNOSIS — I10 Essential (primary) hypertension: Secondary | ICD-10-CM | POA: Insufficient documentation

## 2016-06-12 DIAGNOSIS — Z79899 Other long term (current) drug therapy: Secondary | ICD-10-CM | POA: Insufficient documentation

## 2016-06-12 DIAGNOSIS — J45909 Unspecified asthma, uncomplicated: Secondary | ICD-10-CM | POA: Insufficient documentation

## 2016-06-12 LAB — CBC
HCT: 36.9 % — ABNORMAL LOW (ref 39.0–52.0)
Hemoglobin: 12.3 g/dL — ABNORMAL LOW (ref 13.0–17.0)
MCH: 29.9 pg (ref 26.0–34.0)
MCHC: 33.3 g/dL (ref 30.0–36.0)
MCV: 89.6 fL (ref 78.0–100.0)
PLATELETS: 314 10*3/uL (ref 150–400)
RBC: 4.12 MIL/uL — ABNORMAL LOW (ref 4.22–5.81)
RDW: 15.2 % (ref 11.5–15.5)
WBC: 7.4 10*3/uL (ref 4.0–10.5)

## 2016-06-12 LAB — URINALYSIS, ROUTINE W REFLEX MICROSCOPIC
BILIRUBIN URINE: NEGATIVE
Bacteria, UA: NONE SEEN
GLUCOSE, UA: NEGATIVE mg/dL
KETONES UR: NEGATIVE mg/dL
LEUKOCYTES UA: NEGATIVE
NITRITE: NEGATIVE
PH: 5 (ref 5.0–8.0)
Protein, ur: NEGATIVE mg/dL
Specific Gravity, Urine: 1.008 (ref 1.005–1.030)

## 2016-06-12 LAB — COMPREHENSIVE METABOLIC PANEL
ALT: 27 U/L (ref 17–63)
AST: 32 U/L (ref 15–41)
Albumin: 3.7 g/dL (ref 3.5–5.0)
Alkaline Phosphatase: 96 U/L (ref 38–126)
Anion gap: 14 (ref 5–15)
BILIRUBIN TOTAL: 0.6 mg/dL (ref 0.3–1.2)
CO2: 21 mmol/L — ABNORMAL LOW (ref 22–32)
CREATININE: 0.81 mg/dL (ref 0.61–1.24)
Calcium: 8.8 mg/dL — ABNORMAL LOW (ref 8.9–10.3)
Chloride: 103 mmol/L (ref 101–111)
Glucose, Bld: 97 mg/dL (ref 65–99)
Potassium: 3.8 mmol/L (ref 3.5–5.1)
Sodium: 138 mmol/L (ref 135–145)
TOTAL PROTEIN: 7.6 g/dL (ref 6.5–8.1)

## 2016-06-12 LAB — LIPASE, BLOOD: Lipase: 117 U/L — ABNORMAL HIGH (ref 11–51)

## 2016-06-12 MED ORDER — HYDROMORPHONE HCL 2 MG/ML IJ SOLN
1.0000 mg | Freq: Once | INTRAMUSCULAR | Status: AC
Start: 2016-06-12 — End: 2016-06-12
  Administered 2016-06-12: 1 mg via INTRAVENOUS
  Filled 2016-06-12: qty 1

## 2016-06-12 MED ORDER — FAMOTIDINE IN NACL 20-0.9 MG/50ML-% IV SOLN
20.0000 mg | Freq: Once | INTRAVENOUS | Status: AC
  Administered 2016-06-12: 20 mg via INTRAVENOUS
  Filled 2016-06-12: qty 50

## 2016-06-12 MED ORDER — GI COCKTAIL ~~LOC~~
30.0000 mL | Freq: Once | ORAL | Status: AC
  Administered 2016-06-12: 30 mL via ORAL
  Filled 2016-06-12: qty 30

## 2016-06-12 MED ORDER — SODIUM CHLORIDE 0.9 % IV BOLUS (SEPSIS)
1000.0000 mL | Freq: Once | INTRAVENOUS | Status: AC
  Administered 2016-06-12: 1000 mL via INTRAVENOUS

## 2016-06-12 MED ORDER — ONDANSETRON HCL 4 MG/2ML IJ SOLN
4.0000 mg | Freq: Once | INTRAMUSCULAR | Status: AC
  Administered 2016-06-12: 4 mg via INTRAVENOUS
  Filled 2016-06-12: qty 2

## 2016-06-12 MED ORDER — OXYCODONE-ACETAMINOPHEN 5-325 MG PO TABS: 1.0000 | ORAL_TABLET | Freq: Four times a day (QID) | ORAL | 0 refills | Status: DC | PRN

## 2016-06-12 NOTE — Discharge Instructions (Signed)
It was our pleasure to provide your ER care today - we hope that you feel better.  Continue your acid blocker medication.   Clear liquid diet today, and then advance diet slowly as tolerated.   You may take percocet as need for pain. No driving when taking percocet. Also, do not take tylenol or acetaminophen containing medication when taking percocet.   Follow up with primary care doctor in the next few days for recheck.  Return to ER if worse, new symptoms, persistent vomiting, fevers, other concern.   You were given pain medication in the ER - no driving for the next 6 hours.

## 2016-06-12 NOTE — ED Notes (Signed)
Dr. Ashok Cordia informed pt is requesting more pain medication. New new verbal orders at this time.

## 2016-06-12 NOTE — ED Notes (Signed)
Pt given cup of ice and cup of ginger ale.

## 2016-06-12 NOTE — ED Provider Notes (Addendum)
[facility] DEPT Provider Note   CSN: [id number] Arrival date & time: 06/12/16  [id number]     History   Chief Complaint Chief Complaint  Patient presents with  . Abdominal Pain  . Emesis    HPI [name] is a 46 y.o. male.  Patient with hx pancreatitis, presents c/o epigastric pain for the past day. Pain constant, dull, mod-severe, non radiating, persistent, worse w palpation. Nausea. One episode emesis, not blood or bilious. Having normal bms. No abd distension. Denies fever or chills. No gu c/o. No chest pain or sob.    The history is provided by the patient.  Abdominal Pain   Associated symptoms include vomiting. Pertinent negatives include fever and headaches.  Emesis   Associated symptoms include abdominal pain. Pertinent negatives include no chills, no fever and no headaches.    Past Medical History:  Diagnosis Date  . Alcoholism /alcohol abuse ([place])   . Anemia   . Anxiety   . Arthritis    "knees; arms; elbows" (03/26/2015)  . Asthma   . Bipolar disorder ([place])   . Chronic bronchitis ([place])   . Chronic lower back pain   . Cocaine abuse   . Depression   . Family history of adverse reaction to anesthesia    "grandmother gets confused"  . Femoral condyle fracture ([place]) 03/08/2014   left medial/notes 03/09/2014  . GERD (gastroesophageal reflux disease)   . H/O hiatal hernia   . H/O suicide attempt 10/2012  . Heart murmur    "when he was little" (03/06/2013)  . High cholesterol   . History of blood transfusion 10/2012   "when I tried to commit suicide"  . History of stomach ulcers   . Hypertension   . Marijuana abuse, continuous   . Migraine    "a few times/year" (03/26/2015)  . Pancreatitis   . Pneumonia 1990's X 3  . PTSD (post-traumatic stress disorder)   . Shortness of breath    "can happen at anytime" (03/06/2013)  . Sickle cell trait ([place])   . WPW (Wolff-Parkinson-White syndrome)    [name] 03/06/2013    Patient Active Problem List   Diagnosis Date Noted  . Leukocytosis   . Hospital acquired PNA 05/20/2015  . Pancreatitis 05/18/2015  . Pseudocyst of pancreas 05/18/2015  . Malnutrition of moderate degree 03/27/2015  . Abnormal transaminases 03/26/2015  . Polysubstance abuse (tobacco, cocaine, THC, and ETOH) 03/26/2015  . Acute pancreatitis 03/26/2015  . Alcohol-induced chronic pancreatitis ([place])   . Essential hypertension 02/06/2014  . Alcohol-induced acute pancreatitis 11/28/2013  . Pancreatic pseudocyst/cyst 11/25/2013  . Alcohol dependence ([place]) 10/23/2013  . MDD (major depressive disorder), recurrent severe, without psychosis ([place]) 10/22/2013  . Protein-calorie malnutrition, severe ([place]) 10/10/2013  . Suicide attempt 10/08/2013  . Overdose 10/06/2013  . 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 TOBACCO ABUSE 03/23/2007    Past Surgical History:  Procedure Laterality Date  . CARDIAC CATHETERIZATION    . EYE SURGERY Left 1990's   "result of trauma"   . FACIAL FRACTURE SURGERY Left 1990's   "result of trauma"   . FRACTURE SURGERY    . HERNIA REPAIR    . LEFT HEART CATHETERIZATION WITH CORONARY ANGIOGRAM Right 03/07/2013   Procedure: LEFT HEART CATHETERIZATION WITH CORONARY ANGIOGRAM;  Surgeon: [name], MD;  Location: [facility] CATH LAB;  Service: Cardiovascular;  Laterality: Right;  . UMBILICAL HERNIA REPAIR         Home Medications    Prior to  Admission medications   Medication Sig Start Date End Date Taking? Authorizing Provider  acetaminophen-codeine (TYLENOL #3) 300-30 MG tablet Take 2 tablets by mouth every 4 (four) hours as needed for moderate pain.   Yes Historical Provider, MD  amoxicillin (AMOXIL) 500 MG capsule Take 500 mg by mouth 3 (three) times daily.   Yes Historical Provider, MD  cholestyramine (QUESTRAN) 4 g packet Take 1 packet (4 g total) by mouth 3 (three) times daily as needed (diarrhea). [id number]  Yes Delora Fuel, MD  fluticasone [facility]) 50 MCG/ACT  nasal spray Place 1 spray into both nostrils daily as needed for allergies.    Yes Historical Provider, MD  lipase/protease/amylase (CREON) 12000 UNITS CPEP capsule Take 2 capsules (24,000 Units total) by mouth 3 (three) times daily with meals. 01/15/15  Yes [name], MD  metoprolol tartrate (LOPRESSOR) 25 MG tablet Take 1 tablet (25 mg total) by mouth 2 (two) times daily. 12/14/15  Yes Tiffany Daneil Dan, PA-C  omeprazole (PRILOSEC) 20 MG capsule Take 1 capsule (20 mg total) by mouth daily. [id number]  Yes Delora Fuel, MD  sertraline (ZOLOFT) 25 MG tablet Take 75 mg by mouth daily.   Yes Historical Provider, MD  sucralfate (CARAFATE) 1 g tablet Take 1 tablet (1 g total) by mouth 4 (four) times daily -  with meals and at bedtime. 11/15/15  Yes [place] Lions, PA-C  albuterol (PROVENTIL HFA;VENTOLIN HFA) 108 (90 BASE) MCG/ACT inhaler Inhale 2 puffs into the lungs every 6 (six) hours as needed for wheezing or shortness of breath (wheezing).     Historical Provider, MD  clindamycin (CLEOCIN) 300 MG capsule Take 1 capsule (300 mg total) by mouth 3 (three) times daily. Patient not taking: Reported on 04/24/2016 02/21/16   Kristen N Ward, DO  diphenoxylate-atropine (LOMOTIL) 2.5-0.025 MG tablet Take 1 tablet by mouth 4 (four) times daily as needed for diarrhea or loose stools. Patient not taking: Reported on 04/24/2016 12/04/15   [name], PA-C  ondansetron (ZOFRAN ODT) 4 MG disintegrating tablet Take 1 tablet (4 mg total) by mouth every 8 (eight) hours as needed for nausea or vomiting. Patient not taking: Reported on 04/24/2016 04/17/16   Deno Etienne, DO  ondansetron (ZOFRAN ODT) 8 MG disintegrating tablet Take 1 tablet (8 mg total) by mouth every 8 (eight) hours as needed for nausea or vomiting. Patient not taking: Reported on 06/12/2016 01/25/16   [name], MD  pantoprazole (PROTONIX) 20 MG tablet Take 2 tablets (40 mg total) by mouth daily. Patient not taking: Reported on 04/24/2016  04/11/16   Merryl Hacker, MD  sildenafil (VIAGRA) 50 MG tablet Take 1 tablet (50 mg total) by mouth daily as needed for erectile dysfunction. 12/14/15   Brayton Caves, PA-C    Family History Family History  Problem Relation Age of Onset  . Hypertension Other   . Coronary artery disease Other     Social History Social History  Substance Use Topics  . Smoking status: Current Every Day Smoker    Packs/day: 1.00    Years: 30.00    Types: Cigarettes  . Smokeless tobacco: Current User    Types: Chew  . Alcohol use 7.2 oz/week    12 Cans of beer per week     Allergies   Shellfish-derived products; Trazodone and nefazodone; and Reglan [metoclopramide]   Review of Systems Review of Systems  Constitutional: Negative for chills and fever.  HENT: Negative for sore throat.   Eyes: Negative for redness.  Respiratory: Negative for shortness of breath.   Cardiovascular: Negative for chest pain.  Gastrointestinal: Positive for abdominal pain and vomiting.  Genitourinary: Negative for flank pain.  Musculoskeletal: Negative for back pain and neck pain.  Skin: Negative for rash.  Neurological: Negative for headaches.  Hematological: Does not bruise/bleed easily.  Psychiatric/Behavioral: Negative for confusion.     Physical Exam Updated Vital Signs BP (!) 162/101 (BP Location: Left Arm)   Pulse 91   Temp 98.4 F (36.9 C) (Oral)   Resp 17   Ht 5\' 8"  (1.727 m)   Wt 65.8 kg   SpO2 100%   BMI 22.05 kg/m   Physical Exam  Constitutional: He is oriented to person, place, and time. He appears well-developed and well-nourished. No distress.  HENT:  Mouth/Throat: Oropharynx is clear and moist.  Eyes: Conjunctivae are normal.  Neck: Neck supple. No tracheal deviation present.  Cardiovascular: Normal rate, regular rhythm, normal heart sounds and intact distal pulses.   Pulmonary/Chest: Effort normal and breath sounds normal. No accessory muscle usage. No respiratory distress.    Abdominal: Soft. Bowel sounds are normal. He exhibits no distension and no mass. There is tenderness. There is no rebound and no guarding. No hernia.  Epigastric tenderness.   Genitourinary:  Genitourinary Comments: No cva tenderness  Musculoskeletal: He exhibits no edema.  Neurological: He is alert and oriented to person, place, and time.  Skin: Skin is warm and dry. He is not diaphoretic.  Psychiatric: He has a normal mood and affect.  Nursing note and vitals reviewed.    ED Treatments / Results  Labs (all labs ordered are listed, but only abnormal results are displayed) Results for orders placed or performed during the hospital encounter of 06/12/16  Lipase, blood  Result Value Ref Range   Lipase 117 (H) 11 - 51 U/L  Comprehensive metabolic panel  Result Value Ref Range   Sodium 138 135 - 145 mmol/L   Potassium 3.8 3.5 - 5.1 mmol/L   Chloride 103 101 - 111 mmol/L   CO2 21 (L) 22 - 32 mmol/L   Glucose, Bld 97 65 - 99 mg/dL   BUN <5 (L) 6 - 20 mg/dL   Creatinine, Ser 0.81 0.61 - 1.24 mg/dL   Calcium 8.8 (L) 8.9 - 10.3 mg/dL   Total Protein 7.6 6.5 - 8.1 g/dL   Albumin 3.7 3.5 - 5.0 g/dL   AST 32 15 - 41 U/L   ALT 27 17 - 63 U/L   Alkaline Phosphatase 96 38 - 126 U/L   Total Bilirubin 0.6 0.3 - 1.2 mg/dL   GFR calc non Af Amer >60 >60 mL/min   GFR calc Af Amer >60 >60 mL/min   Anion gap 14 5 - 15  CBC  Result Value Ref Range   WBC 7.4 4.0 - 10.5 K/uL   RBC 4.12 (L) 4.22 - 5.81 MIL/uL   Hemoglobin 12.3 (L) 13.0 - 17.0 g/dL   HCT 36.9 (L) 39.0 - 52.0 %   MCV 89.6 78.0 - 100.0 fL   MCH 29.9 26.0 - 34.0 pg   MCHC 33.3 30.0 - 36.0 g/dL   RDW 15.2 11.5 - 15.5 %   Platelets 314 150 - 400 K/uL  Urinalysis, Routine w reflex microscopic  Result Value Ref Range   Color, Urine STRAW (A) YELLOW   APPearance CLEAR CLEAR   Specific Gravity, Urine 1.008 1.005 - 1.030   pH 5.0 5.0 - 8.0   Glucose, UA NEGATIVE NEGATIVE mg/dL  Hgb urine dipstick SMALL (A) NEGATIVE   Bilirubin  Urine NEGATIVE NEGATIVE   Ketones, ur NEGATIVE NEGATIVE mg/dL   Protein, ur NEGATIVE NEGATIVE mg/dL   Nitrite NEGATIVE NEGATIVE   Leukocytes, UA NEGATIVE NEGATIVE   RBC / HPF 0-5 0 - 5 RBC/hpf   WBC, UA 0-5 0 - 5 WBC/hpf   Bacteria, UA NONE SEEN NONE SEEN   Squamous Epithelial / LPF 0-5 (A) NONE SEEN    EKG  EKG Interpretation  Date/Time:  Sunday June 12 2016 13:24:46 EST Ventricular Rate:  86 PR Interval:    QRS Duration: 92 QT Interval:  347 QTC Calculation: 415 R Axis:   76 Text Interpretation:  Sinus rhythm Short PR interval Nonspecific T wave abnormality `similar ecg appearance to 02/20/16 Confirmed by Ashok Cordia  MD, Lennette Bihari ([id number]) on 06/12/2016 1:29:23 PM       Radiology No results found.  Procedures Procedures (including critical care time)  Medications Ordered in ED Medications  famotidine (PEPCID) IVPB 20 mg premix (not administered)  HYDROmorphone (DILAUDID) injection 1 mg (not administered)  ondansetron (ZOFRAN) injection 4 mg (not administered)  sodium chloride 0.9 % bolus 1,000 mL (not administered)     Initial Impression / Assessment and Plan / ED Course  I have reviewed the triage vital signs and the nursing notes.  Pertinent labs & imaging results that were available during my care of the patient were reviewed by me and considered in my medical decision making (see chart for details).  Reviewed nursing notes and prior charts for additional history.   Iv ns bolus. Labs.  Epigastric pain. ecg unchanged from 02/20/16-  On review prior chart, normal cardiac cath in past.   Lipase mildly elevated.  Dilaudid 1 mg iv. pepcid iv.  Recheck pt, comfortable, tolerating po fluids.   Patient currently appears stable for d/c.   Pt tolerating po, pain improved. No emesis.   Final Clinical Impressions(s) / ED Diagnoses   Final diagnoses:  None    New Prescriptions New Prescriptions   No medications on file           Lajean Saver,  MD 06/12/16 1331

## 2016-06-12 NOTE — ED Triage Notes (Signed)
Patient arrived by [facility] for left sided abdominal pain x 2 days. States that he knows related to his pancreatitis with diarrhea and 2 episodes of vomiting. Non on arrival, denies etoh the past week. NAD

## 2016-06-14 ENCOUNTER — Encounter (HOSPITAL_COMMUNITY): Payer: Self-pay

## 2016-06-14 DIAGNOSIS — R1013 Epigastric pain: Secondary | ICD-10-CM | POA: Insufficient documentation

## 2016-06-14 DIAGNOSIS — J45909 Unspecified asthma, uncomplicated: Secondary | ICD-10-CM | POA: Insufficient documentation

## 2016-06-14 DIAGNOSIS — I10 Essential (primary) hypertension: Secondary | ICD-10-CM | POA: Insufficient documentation

## 2016-06-14 DIAGNOSIS — G8929 Other chronic pain: Secondary | ICD-10-CM | POA: Insufficient documentation

## 2016-06-14 DIAGNOSIS — Z79899 Other long term (current) drug therapy: Secondary | ICD-10-CM | POA: Insufficient documentation

## 2016-06-14 DIAGNOSIS — F17210 Nicotine dependence, cigarettes, uncomplicated: Secondary | ICD-10-CM | POA: Insufficient documentation

## 2016-06-14 LAB — URINALYSIS, ROUTINE W REFLEX MICROSCOPIC
Bilirubin Urine: NEGATIVE
GLUCOSE, UA: NEGATIVE mg/dL
Hgb urine dipstick: NEGATIVE
Ketones, ur: NEGATIVE mg/dL
LEUKOCYTES UA: NEGATIVE
NITRITE: NEGATIVE
PH: 5 (ref 5.0–8.0)
Protein, ur: NEGATIVE mg/dL
SPECIFIC GRAVITY, URINE: 1.025 (ref 1.005–1.030)

## 2016-06-14 LAB — CBC
HEMATOCRIT: 34 % — AB (ref 39.0–52.0)
HEMOGLOBIN: 11.3 g/dL — AB (ref 13.0–17.0)
MCH: 29.9 pg (ref 26.0–34.0)
MCHC: 33.2 g/dL (ref 30.0–36.0)
MCV: 89.9 fL (ref 78.0–100.0)
Platelets: 309 10*3/uL (ref 150–400)
RBC: 3.78 MIL/uL — AB (ref 4.22–5.81)
RDW: 15.2 % (ref 11.5–15.5)
WBC: 9.5 10*3/uL (ref 4.0–10.5)

## 2016-06-14 NOTE — ED Triage Notes (Signed)
Pt comes via [facility], c/o of abd pain, seen Sunday for the same, n/v, unrelieved by Carafate.

## 2016-06-15 ENCOUNTER — Encounter: Payer: Self-pay | Admitting: *Deleted

## 2016-06-15 ENCOUNTER — Emergency Department (HOSPITAL_COMMUNITY)
Admission: EM | Admit: 2016-06-15 | Discharge: 2016-06-15 | Disposition: A | Discharge status: Home / Self Care | Payer: Self-pay | Attending: Emergency Medicine | Admitting: Emergency Medicine

## 2016-06-15 DIAGNOSIS — G8929 Other chronic pain: Secondary | ICD-10-CM

## 2016-06-15 LAB — COMPREHENSIVE METABOLIC PANEL
ALT: 24 U/L (ref 17–63)
ANION GAP: 9 (ref 5–15)
AST: 43 U/L — ABNORMAL HIGH (ref 15–41)
Albumin: 3.6 g/dL (ref 3.5–5.0)
Alkaline Phosphatase: 78 U/L (ref 38–126)
BUN: 8 mg/dL (ref 6–20)
CHLORIDE: 107 mmol/L (ref 101–111)
CO2: 21 mmol/L — AB (ref 22–32)
Calcium: 8.9 mg/dL (ref 8.9–10.3)
Creatinine, Ser: 0.64 mg/dL (ref 0.61–1.24)
GFR calc non Af Amer: >60 mL/min (ref >60)
Glucose, Bld: 106 mg/dL — ABNORMAL HIGH (ref 65–99)
POTASSIUM: 4.3 mmol/L (ref 3.5–5.1)
SODIUM: 137 mmol/L (ref 135–145)
Total Bilirubin: 0.3 mg/dL (ref 0.3–1.2)
Total Protein: 6.8 g/dL (ref 6.5–8.1)

## 2016-06-15 LAB — LIPASE, BLOOD: LIPASE: 40 U/L (ref 11–51)

## 2016-06-15 LAB — POC OCCULT BLOOD, ED: FECAL OCCULT BLD: NEGATIVE

## 2016-06-15 MED ORDER — KETOROLAC TROMETHAMINE 60 MG/2ML IM SOLN
30.0000 mg | Freq: Once | INTRAMUSCULAR | Status: AC
  Administered 2016-06-15: 30 mg via INTRAMUSCULAR
  Filled 2016-06-15: qty 2

## 2016-06-15 MED ORDER — CALCIUM CARBONATE ANTACID 600 MG PO CHEW: 600.0000 mg | CHEWABLE_TABLET | Freq: Two times a day (BID) | ORAL | 0 refills | Status: DC

## 2016-06-15 MED ORDER — GI COCKTAIL ~~LOC~~
30.0000 mL | Freq: Once | ORAL | Status: AC
  Administered 2016-06-15: 30 mL via ORAL
  Filled 2016-06-15: qty 30

## 2016-06-15 NOTE — ED Notes (Addendum)
Pt called for in waiting area no answer.  

## 2016-06-15 NOTE — Discharge Instructions (Signed)
Please follow with your primary care doctor in the next 2 days for a check-up. They must obtain records for further management.  ° °Do not hesitate to return to the Emergency Department for any new, worsening or concerning symptoms.  ° °

## 2016-06-15 NOTE — Progress Notes (Signed)
EDCM noted request for GI consult for pt with new orange card.  Consult/appointment has to be through PCP as this is the process.  CM can not set up GI appointment.

## 2016-06-15 NOTE — ED Notes (Signed)
Pt called once more for vital sign reassessment. No answer

## 2016-06-15 NOTE — ED Provider Notes (Signed)
[facility] DEPT Provider Note   CSN: [id number] Arrival date & time: 06/14/16  2317     History   Chief Complaint No chief complaint on file.   HPI   Blood pressure 124/84, pulse 101, temperature 97.7 F (36.5 C), temperature source Oral, resp. rate 18, SpO2 96 %.  [name] is a 46 y.o. male with history of alcoholism and chronic pancreatitis presenting with epigastric pain and intermittent nausea vomiting (states he is tolerating by mouth's) over the last 5 days. He was seen and had an elevated lipase of 117: He denies fevers, chills, change in urination, chest pain, shortness of breath. He states he had darker than normal stool this morning. He's been taking his Carafate and Percocet at home with little relief. He states that he has recently qualified for the [place] but his primary care physician has not done that as of yet.  Past Medical History:  Diagnosis Date  . Alcoholism /alcohol abuse ([place])   . Anemia   . Anxiety   . Arthritis    "knees; arms; elbows" (03/26/2015)  . Asthma   . Bipolar disorder ([place])   . Chronic bronchitis ([place])   . Chronic lower back pain   . Cocaine abuse   . Depression   . Family history of adverse reaction to anesthesia    "grandmother gets confused"  . Femoral condyle fracture ([place]) 03/08/2014   left medial/notes 03/09/2014  . GERD (gastroesophageal reflux disease)   . H/O hiatal hernia   . H/O suicide attempt 10/2012  . Heart murmur    "when he was little" (03/06/2013)  . High cholesterol   . History of blood transfusion 10/2012   "when I tried to commit suicide"  . History of stomach ulcers   . Hypertension   . Marijuana abuse, continuous   . Migraine    "a few times/year" (03/26/2015)  . Pancreatitis   . Pneumonia 1990's X 3  . PTSD (post-traumatic stress disorder)   . Shortness of breath    "can happen at anytime" (03/06/2013)  . Sickle cell trait ([place])   . WPW (Wolff-Parkinson-White syndrome)    [name] 03/06/2013     Patient Active Problem List   Diagnosis Date Noted  . Leukocytosis   . Hospital acquired PNA 05/20/2015  . Pancreatitis 05/18/2015  . Pseudocyst of pancreas 05/18/2015  . Malnutrition of moderate degree 03/27/2015  . Abnormal transaminases 03/26/2015  . Polysubstance abuse (tobacco, cocaine, THC, and ETOH) 03/26/2015  . Acute pancreatitis 03/26/2015  . Alcohol-induced chronic pancreatitis ([place])   . Essential hypertension 02/06/2014  . Alcohol-induced acute pancreatitis 11/28/2013  . Pancreatic pseudocyst/cyst 11/25/2013  . Alcohol dependence ([place]) 10/23/2013  . MDD (major depressive disorder), recurrent severe, without psychosis ([place]) 10/22/2013  . Protein-calorie malnutrition, severe ([place]) 10/10/2013  . Suicide attempt 10/08/2013  . Overdose 10/06/2013  . 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 TOBACCO ABUSE 03/23/2007    Past Surgical History:  Procedure Laterality Date  . CARDIAC CATHETERIZATION    . EYE SURGERY Left 1990's   "result of trauma"   . FACIAL FRACTURE SURGERY Left 1990's   "result of trauma"   . FRACTURE SURGERY    . HERNIA REPAIR    . LEFT HEART CATHETERIZATION WITH CORONARY ANGIOGRAM Right 03/07/2013   Procedure: LEFT HEART CATHETERIZATION WITH CORONARY ANGIOGRAM;  Surgeon: [name], MD;  Location: [facility] CATH LAB;  Service: Cardiovascular;  Laterality: Right;  . UMBILICAL HERNIA REPAIR  Home Medications    Prior to Admission medications   Medication Sig Start Date End Date Taking? Authorizing Provider  acetaminophen-codeine (TYLENOL #3) 300-30 MG tablet Take 2 tablets by mouth every 4 (four) hours as needed for moderate pain.    Historical Provider, MD  albuterol (PROVENTIL HFA;VENTOLIN HFA) 108 (90 BASE) MCG/ACT inhaler Inhale 2 puffs into the lungs every 6 (six) hours as needed for wheezing or shortness of breath (wheezing).     Historical Provider, MD  amoxicillin (AMOXIL) 500 MG capsule Take 500 mg by  mouth 3 (three) times daily.    Historical Provider, MD  Calcium Carbonate Antacid (MAALOX) 600 MG chewable tablet Chew 1 tablet (600 mg total) by mouth 2 (two) times daily. 06/15/16   Emilynn Srinivasan, PA-C  cholestyramine (QUESTRAN) 4 g packet Take 1 packet (4 g total) by mouth 3 (three) times daily as needed (diarrhea). [id number]   Delora Fuel, MD  clindamycin (CLEOCIN) 300 MG capsule Take 1 capsule (300 mg total) by mouth 3 (three) times daily. Patient not taking: Reported on 04/24/2016 02/21/16   Kristen N Ward, DO  diphenoxylate-atropine (LOMOTIL) 2.5-0.025 MG tablet Take 1 tablet by mouth 4 (four) times daily as needed for diarrhea or loose stools. Patient not taking: Reported on 04/24/2016 12/04/15   [name], PA-C  fluticasone (FLONASE) 50 MCG/ACT nasal spray Place 1 spray into both nostrils daily as needed for allergies.     Historical Provider, MD  lipase/protease/amylase (CREON) 12000 UNITS CPEP capsule Take 2 capsules (24,000 Units total) by mouth 3 (three) times daily with meals. 01/15/15   [name], MD  metoprolol tartrate (LOPRESSOR) 25 MG tablet Take 1 tablet (25 mg total) by mouth 2 (two) times daily. 12/14/15   Tiffany Daneil Dan, PA-C  omeprazole (PRILOSEC) 20 MG capsule Take 1 capsule (20 mg total) by mouth daily. [id number]   Delora Fuel, MD  ondansetron (ZOFRAN ODT) 4 MG disintegrating tablet Take 1 tablet (4 mg total) by mouth every 8 (eight) hours as needed for nausea or vomiting. Patient not taking: Reported on 04/24/2016 04/17/16   Deno Etienne, DO  ondansetron (ZOFRAN ODT) 8 MG disintegrating tablet Take 1 tablet (8 mg total) by mouth every 8 (eight) hours as needed for nausea or vomiting. Patient not taking: Reported on 06/12/2016 01/25/16   [name], MD  oxyCODONE-acetaminophen (PERCOCET/ROXICET) 5-325 MG tablet Take 1-2 tablets by mouth every 6 (six) hours as needed for severe pain. 06/12/16   Lajean Saver, MD  pantoprazole (PROTONIX) 20 MG tablet Take 2 tablets (40 mg  total) by mouth daily. Patient not taking: Reported on 04/24/2016 04/11/16   Merryl Hacker, MD  sertraline (ZOLOFT) 25 MG tablet Take 75 mg by mouth daily.    Historical Provider, MD  sildenafil (VIAGRA) 50 MG tablet Take 1 tablet (50 mg total) by mouth daily as needed for erectile dysfunction. 12/14/15   Brayton Caves, PA-C  sucralfate (CARAFATE) 1 g tablet Take 1 tablet (1 g total) by mouth 4 (four) times daily -  with meals and at bedtime. 11/15/15   [place] Lions, PA-C    Family History Family History  Problem Relation Age of Onset  . Hypertension Other   . Coronary artery disease Other     Social History Social History  Substance Use Topics  . Smoking status: Current Every Day Smoker    Packs/day: 1.00    Years: 30.00    Types: Cigarettes  . Smokeless tobacco: Current User  Types: Chew  . Alcohol use 7.2 oz/week    12 Cans of beer per week     Allergies   Shellfish-derived products; Trazodone and nefazodone; and Reglan [metoclopramide]   Review of Systems Review of Systems  10 systems reviewed and found to be negative, except as noted in the HPI.   Physical Exam Updated Vital Signs BP 124/84   Pulse 101   Temp 97.7 F (36.5 C) (Oral)   Resp 18   SpO2 96%   Physical Exam  Constitutional: He is oriented to person, place, and time. He appears well-developed and well-nourished. No distress.  HENT:  Head: Normocephalic and atraumatic.  Mouth/Throat: Oropharynx is clear and moist.  No conjunctival pallor  Eyes: Conjunctivae and EOM are normal. Pupils are equal, round, and reactive to light.  Neck: Normal range of motion.  Cardiovascular: Normal rate, regular rhythm and intact distal pulses.   Pulmonary/Chest: Effort normal and breath sounds normal.  Abdominal: Soft. He exhibits no distension and no mass. There is tenderness. There is no rebound and no guarding. No hernia.  Normoactive bowel sounds, mild tenderness in the epigastrium with no guarding  or rebound.  Genitourinary:  Genitourinary Comments: General rectal exam a chaperoned by RN: No rashes or lesions, no fissures, normal rectal tone, normal stool color, guaiac negative  Musculoskeletal: Normal range of motion.  Neurological: He is alert and oriented to person, place, and time.  Skin: Capillary refill takes less than 2 seconds. He is not diaphoretic.  Psychiatric: He has a normal mood and affect.  Nursing note and vitals reviewed.    ED Treatments / Results  Labs (all labs ordered are listed, but only abnormal results are displayed) Labs Reviewed  COMPREHENSIVE METABOLIC PANEL - Abnormal; Notable for the following:       Result Value   CO2 21 (*)    Glucose, Bld 106 (*)    AST 43 (*)    All other components within normal limits  CBC - Abnormal; Notable for the following:    RBC 3.78 (*)    Hemoglobin 11.3 (*)    HCT 34.0 (*)    All other components within normal limits  LIPASE, BLOOD  URINALYSIS, ROUTINE W REFLEX MICROSCOPIC  POC OCCULT BLOOD, ED    EKG  EKG Interpretation None       Radiology No results found.  Procedures Procedures (including critical care time)  Medications Ordered in ED Medications  ketorolac (TORADOL) injection 30 mg (30 mg Intramuscular Given 06/15/16 0656)  gi cocktail (Maalox,Lidocaine,Donnatal) (30 mLs Oral Given 06/15/16 [id number])     Initial Impression / Assessment and Plan / ED Course  I have reviewed the triage vital signs and the nursing notes.  Pertinent labs & imaging results that were available during my care of the patient were reviewed by me and considered in my medical decision making (see chart for details).     Vitals:   06/14/16 2319 06/14/16 2320 06/15/16 0219  BP:  136/95 124/84  Pulse:  107 101  Resp:  18 18  Temp:  97.7 F (36.5 C)   TempSrc:  Oral   SpO2: 98% 100% 96%    Medications  ketorolac (TORADOL) injection 30 mg (30 mg Intramuscular Given 06/15/16 0656)  gi cocktail  (Maalox,Lidocaine,Donnatal) (30 mLs Oral Given 06/15/16 0656)    [name] is 46 y.o. male presenting with Epigastric pain with intermittent nausea and vomiting onset several days ago, he initially had an elevated lipase of 117  however, this is normalized. Abdominal exam is nonsurgical. He does have a very mild tachycardia of 101. He is reporting darker than normal stool however his fecal occult blood is negative. He shows no sign of anemia or elevated BUN. This patient has recently qualified for the orange card, he has not yet been evaluated by GI for his chronic pancreatitis. I've encouraged him to call to make the appointment and also case management to see if we can help him set up these initial appointments. Patient with no signs of clinical dehydration, urinal at bedside with very light colored urine, mucous membranes moist. Appropriate for discharge.  Evaluation does not show pathology that would require ongoing emergent intervention or inpatient treatment. Pt is hemodynamically stable and mentating appropriately. Discussed findings and plan with patient/guardian, who agrees with care plan. All questions answered. Return precautions discussed and outpatient follow up given.   Final Clinical Impressions(s) / ED Diagnoses   Final diagnoses:  Other chronic pain    New Prescriptions New Prescriptions   CALCIUM CARBONATE ANTACID (MAALOX) 600 MG CHEWABLE TABLET    Chew 1 tablet (600 mg total) by mouth 2 (two) times daily.     Monico Blitz, PA-C 06/15/16 [place], MD 06/20/16 [phone]

## 2016-06-15 NOTE — ED Notes (Signed)
Pt called for in waiting area for vital sign reassessment x3. No answer

## 2016-06-17 IMAGING — CT CT ABD-PELV W/ CM
2 of 5 series · 12 of 46 positions shown, 14 images · IV contrast (omnipaque)
Comparison: August 17, 2012

ADDENDUM:
Voice recognition error: The third impression should read "Mild
thickening in urinary bladder wall raises question of a degree of
cystitis". Original dictation by Dr. Laaouina, addendum by Dr.
Aung

Findings conveyed Nana Qwesi Khandy 12/26/2013  at[DATE].
CLINICAL DATA: Acute pancreatitis
EXAM:
CT ABDOMEN AND PELVIS WITH CONTRAST
TECHNIQUE: Multidetector CT imaging of the abdomen and pelvis was performed
using the standard protocol following bolus administration of
intravenous contrast. Oral contrast was also administered.
CONTRAST:  100mL OMNIPAQUE IOHEXOL 300 MG/ML  SOLN

[Series 201: routine, idose (2) · axial · 0.66mm/px · z∈[-458,-68]mm · 9 of 90 slices shown, 11 images]
[im 6/90  soft-tissue]
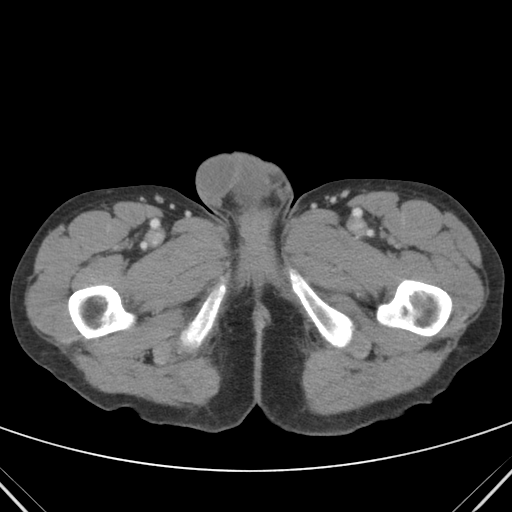
[im 6/90  bone]
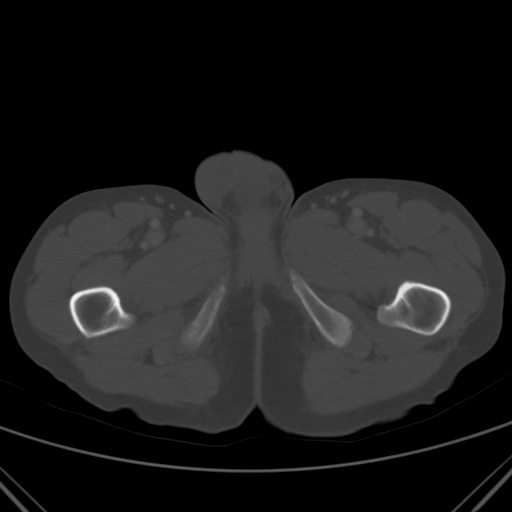
[im 16/90  soft-tissue]
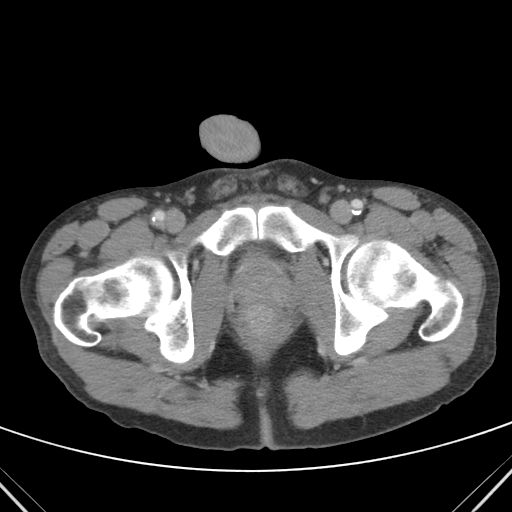
[im 27/90  soft-tissue]
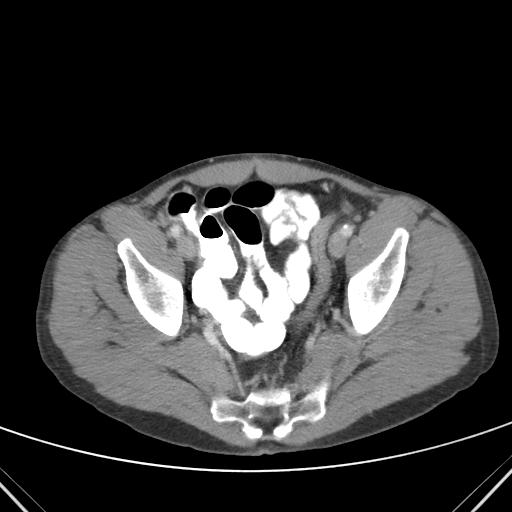
[im 37/90  soft-tissue]
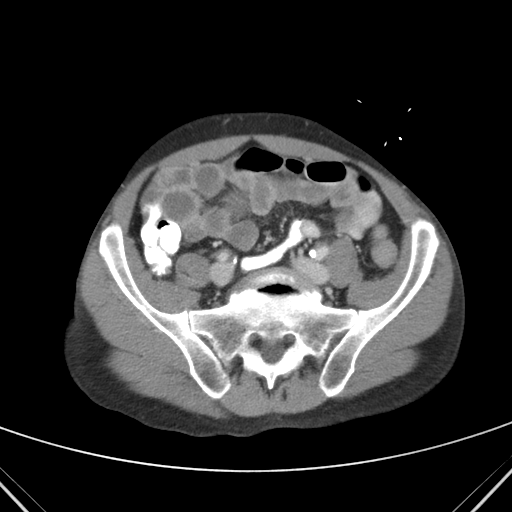
[im 48/90  soft-tissue]
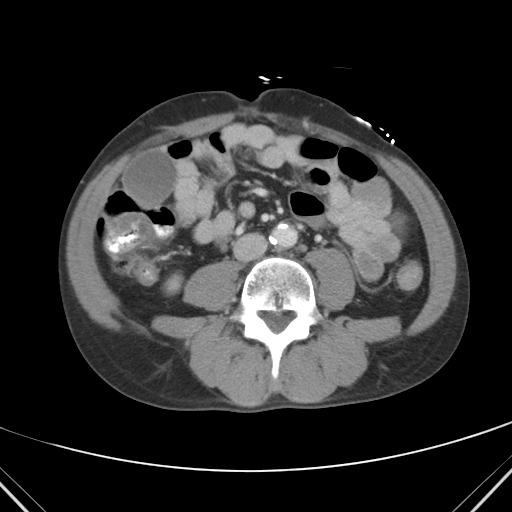
[im 53/90  soft-tissue]
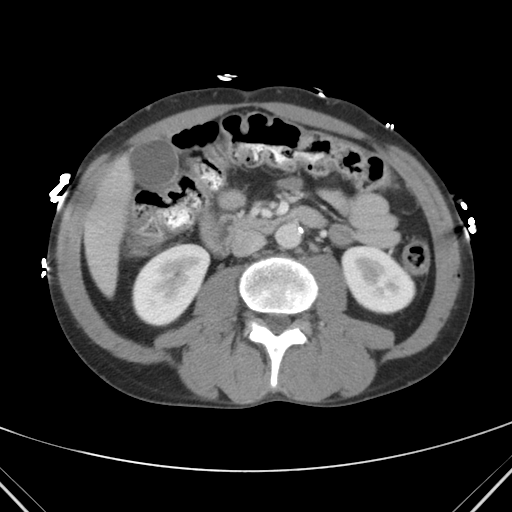
[im 63/90  soft-tissue]
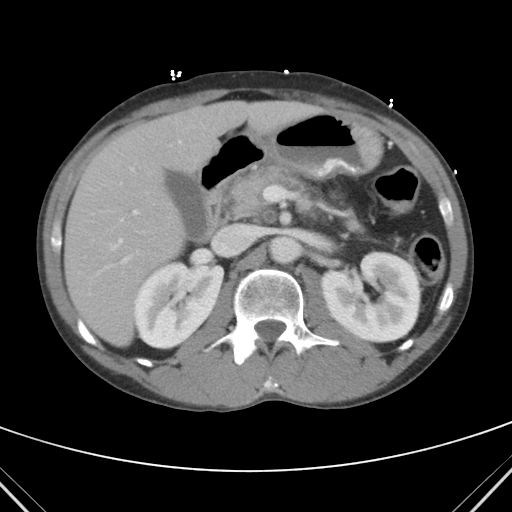
[im 74/90  soft-tissue]
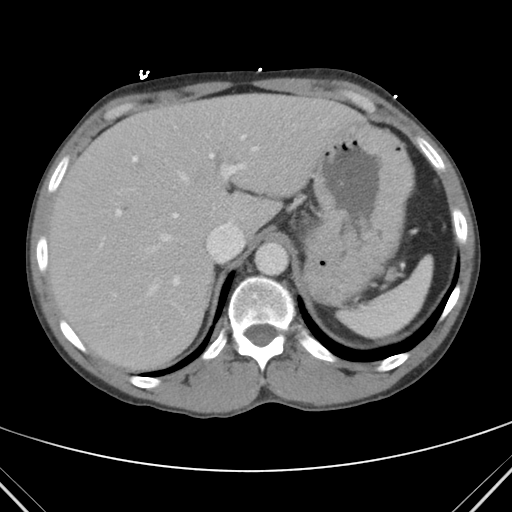
[im 84/90  soft-tissue]
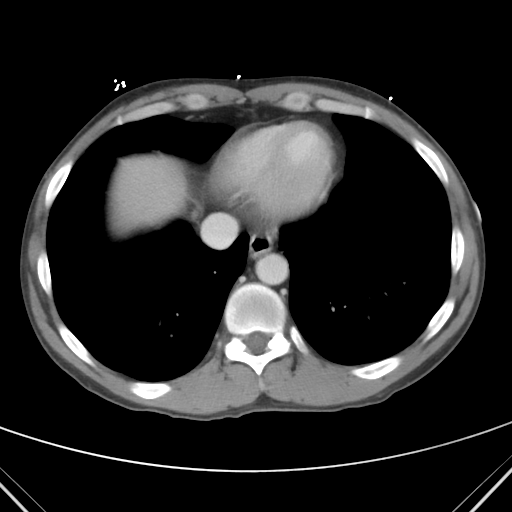
[im 84/90  bone]
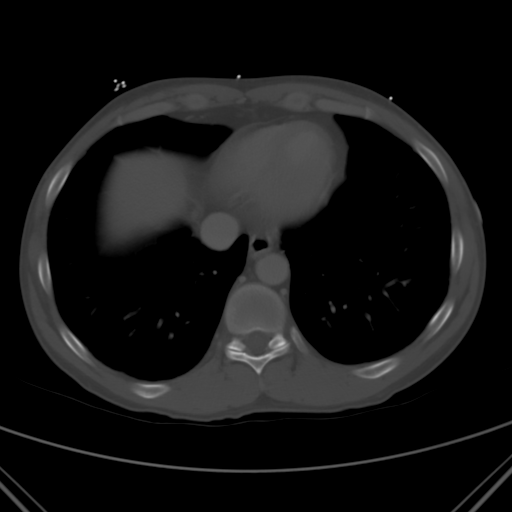

[Series 203: coronals, idose (2) · coronal · 0.50mm/px · 3 of 105 slices shown]
[im 35/105  soft-tissue]
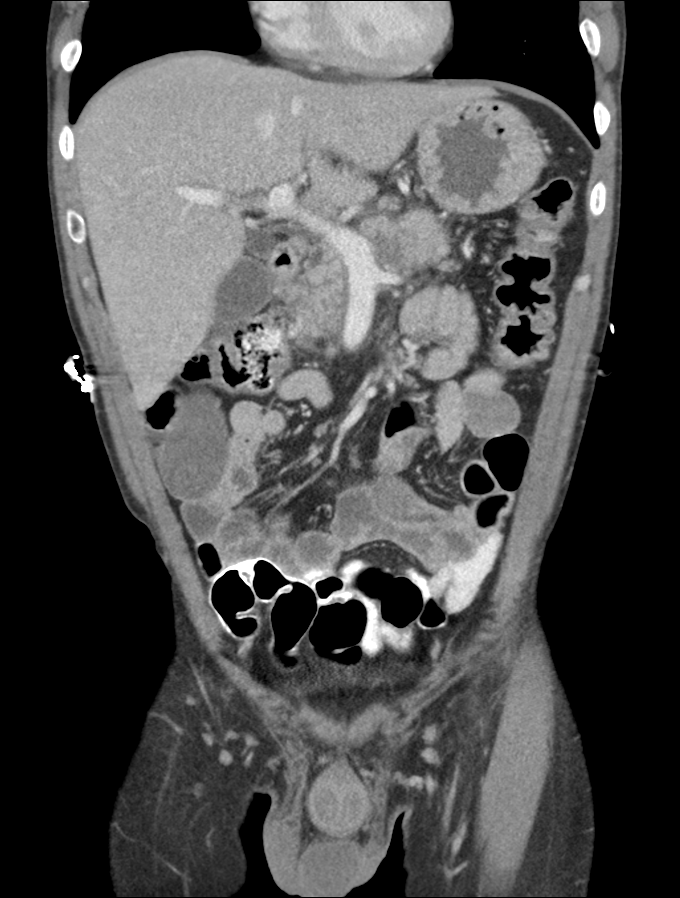
[im 47/105  soft-tissue]
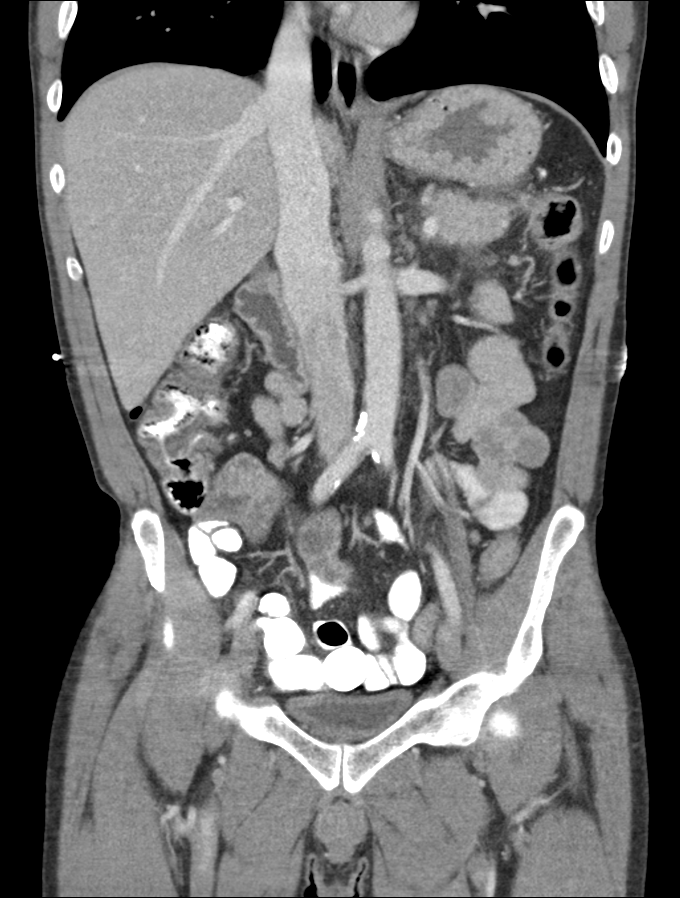
[im 58/105  soft-tissue]
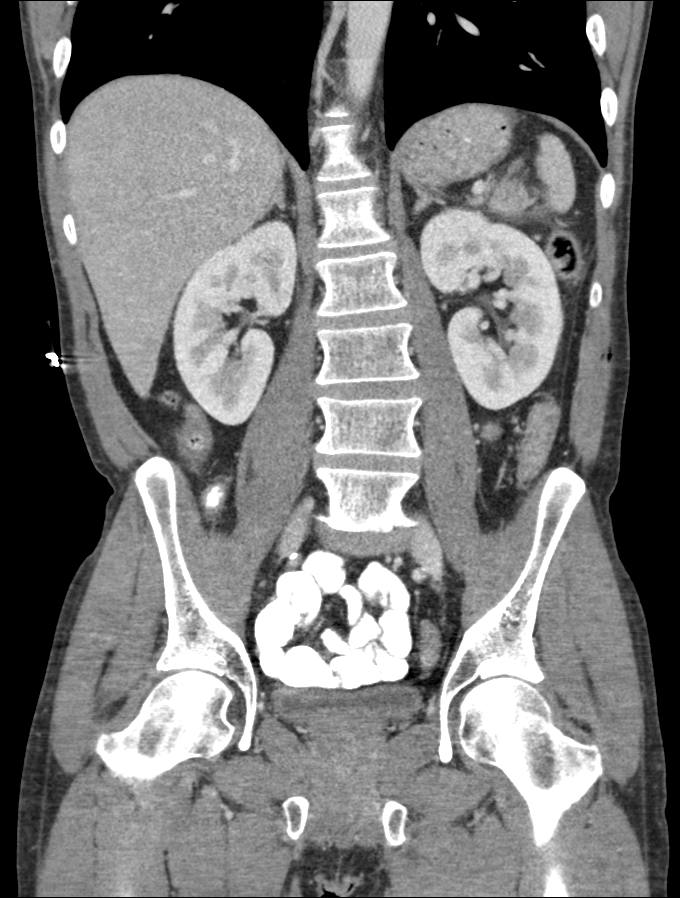

[12 of 46 positions shown; findings below may reference images not displayed]

FINDINGS: Lung bases are clear.

Liver is prominent, measuring 17.9 cm in length. There is hepatic
steatosis. No focal liver lesions are identified. There is no
biliary duct dilatation. Gallbladder wall is not thickened.

Spleen and adrenals appear normal.

There is moderate inflammatory change surrounding the pancreas,
primarily in the head and body regions. There is edema in the
uncinate process of the pancreas. There is an apparent pseudocyst in
the uncinate process of the pancreas measuring 2.1 by 0.8 cm. There
are scattered areas of focal pancreatic duct dilatation. There is no
calcification or non cystic mass in the pancreas. Inflammation is
seen abutting the second and third portions of the duodenum adjacent
to the pancreas with mild wall thickening in this area consistent
with a degree of duodenitis.

Kidneys bilaterally show no evidence of mass or hydronephrosis.
There is no renal or ureteral calculus on either side.

In the pelvis, there is mild wall thickening of the urinary bladder.
There is no pelvic mass or fluid collection. The appendix appears
normal. Terminal ileum appears normal.

There is no appreciable bowel obstruction. No free air or portal
venous air.

There is no ascites, adenopathy, or abscess in the abdomen or
pelvis. There is atherosclerotic change in the aorta and iliac
arteries without aneurysm. There is degenerative change at L5-S1
with vacuum phenomenon at this level. There are no blastic or lytic
bone lesions. There is broad-based disc bulging at L4-5 and L5-S1.
IMPRESSION: Evidence of acute pancreatitis with mild peripancreatic fluid an
apparent small pseudocyst in the uncinate process region. There is
adjacent duodenitis due to the pancreatitis immediately adjacent to
the duodenum in the second and third portions.

Appendix appears normal. No bowel obstruction. No abscess. No renal
or ureteral calculus. No hydronephrosis.

Mild thickening in urinary bladder wall raises question of a degree
of appendicitis.

Liver enlarged with hepatic steatosis.

## 2016-07-01 IMAGING — CT CT ABD-PELV W/ CM
2 of 5 series · 17 of 46 positions shown, 19 images · IV contrast (Omni 300)
Comparison: CT abdomen 12/26/2013

CLINICAL DATA: Pancreatitis with pseudocyst.  Abdominal pain

EXAM:
CT ABDOMEN AND PELVIS WITH CONTRAST
TECHNIQUE: Multidetector CT imaging of the abdomen and pelvis was performed
using the standard protocol following bolus administration of
intravenous contrast.
CONTRAST:  80mL OMNIPAQUE IOHEXOL 300 MG/ML  SOLN

[Series 2: abd/ pelvis 5.0 i30f 1 · axial · 0.69mm/px · z∈[+894,+1264]mm · 14 of 84 slices shown, 16 images]
[im 5/84  soft-tissue]
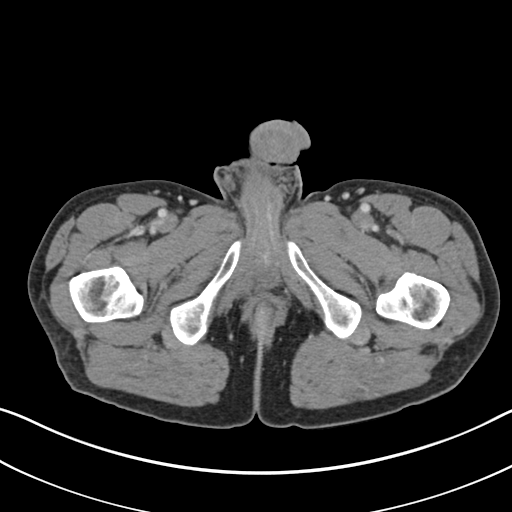
[im 5/84  bone]
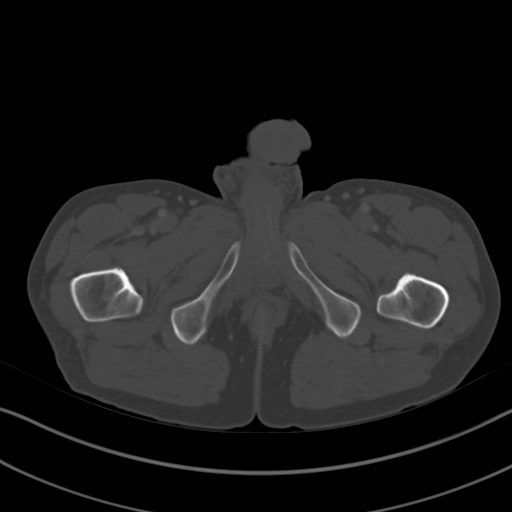
[im 9/84  soft-tissue]
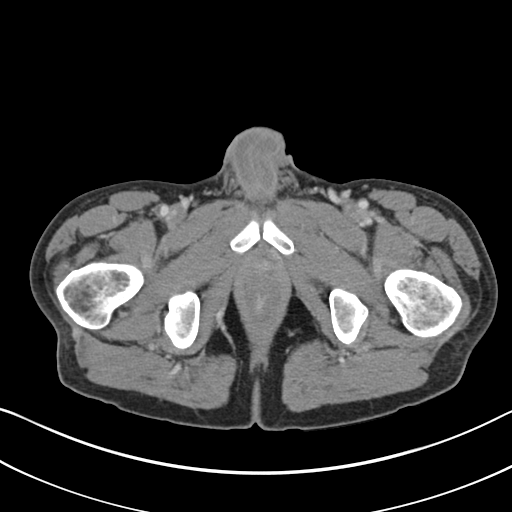
[im 18/84  soft-tissue]
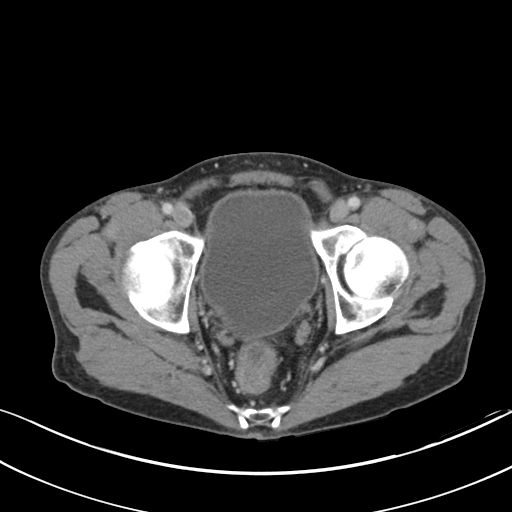
[im 22/84  soft-tissue]
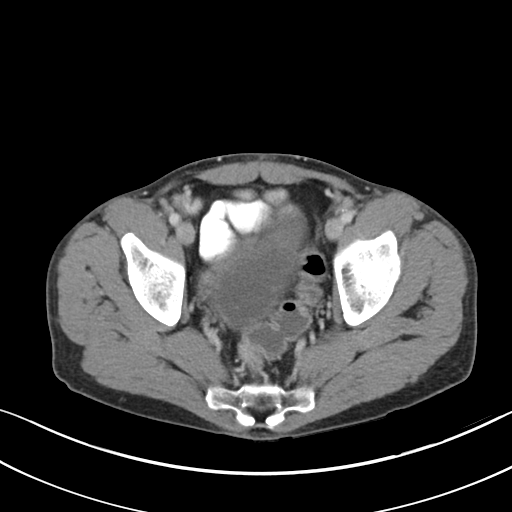
[im 27/84  soft-tissue]
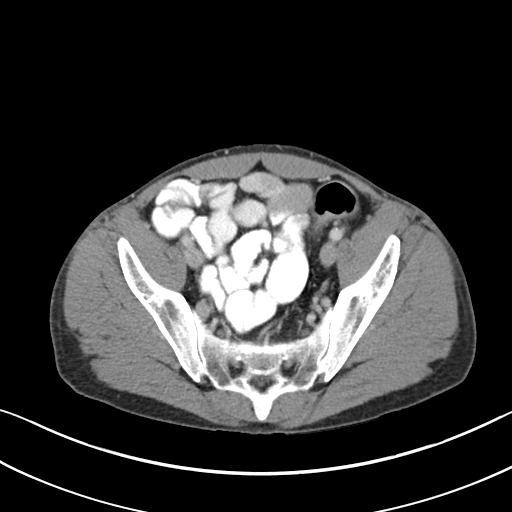
[im 35/84  soft-tissue]
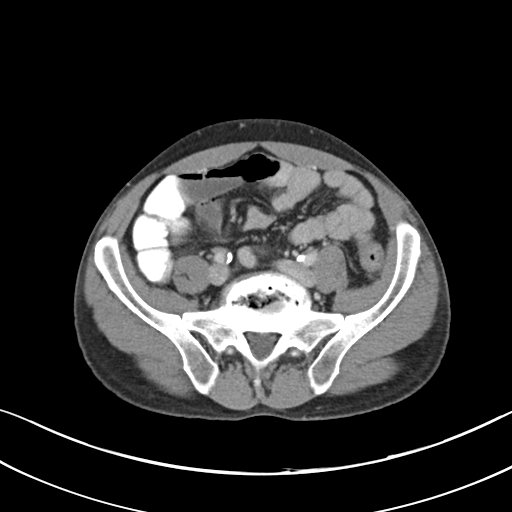
[im 40/84  soft-tissue]
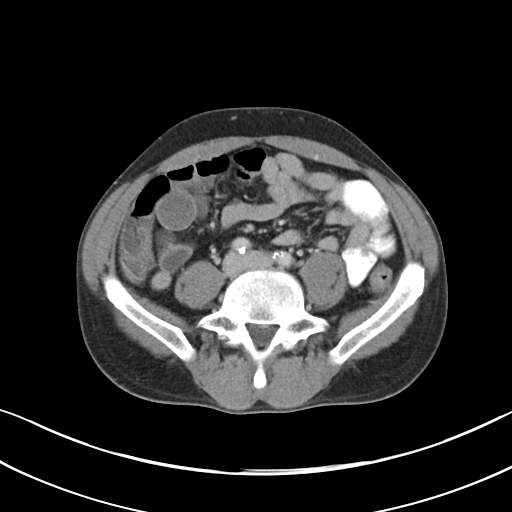
[im 44/84  soft-tissue]
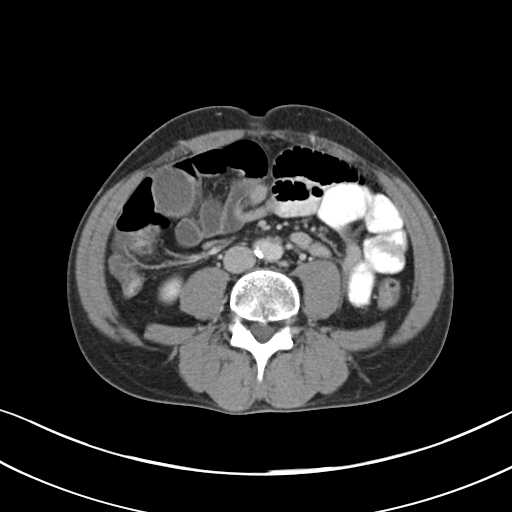
[im 49/84  soft-tissue]
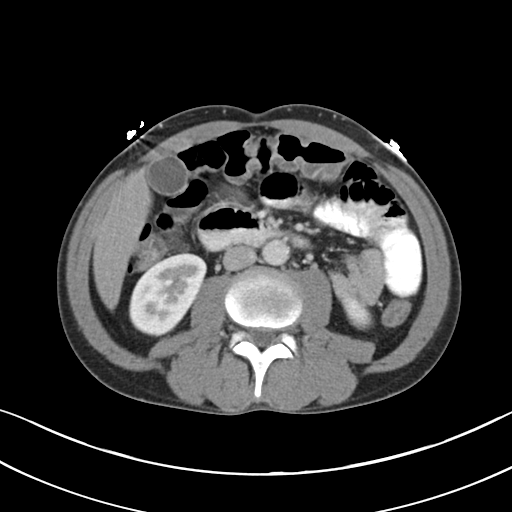
[im 49/84  bone]
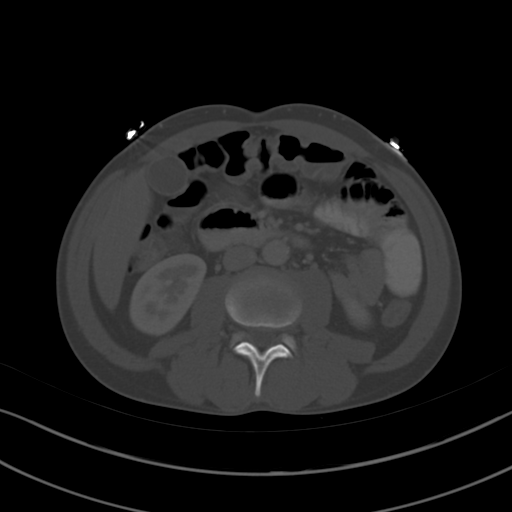
[im 57/84  soft-tissue]
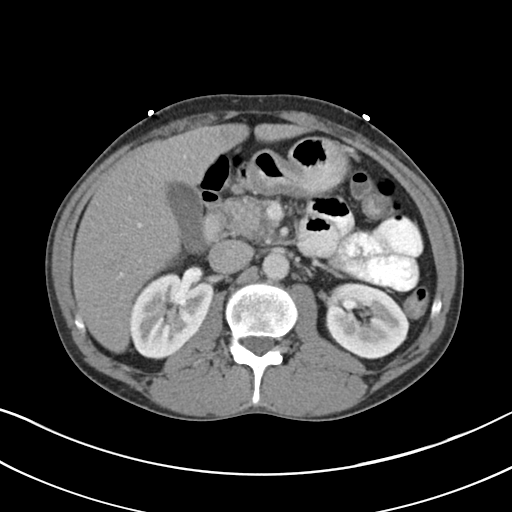
[im 62/84  soft-tissue]
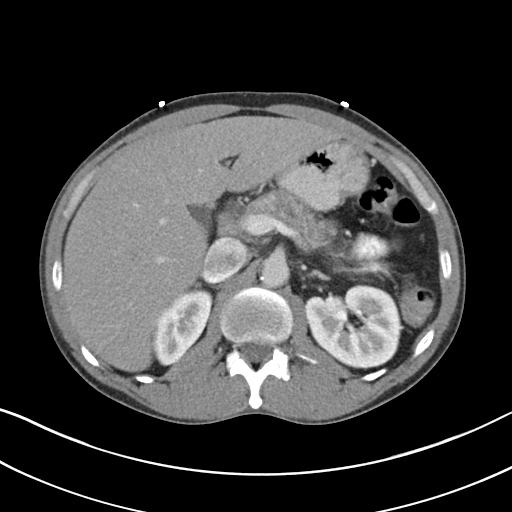
[im 66/84  soft-tissue]
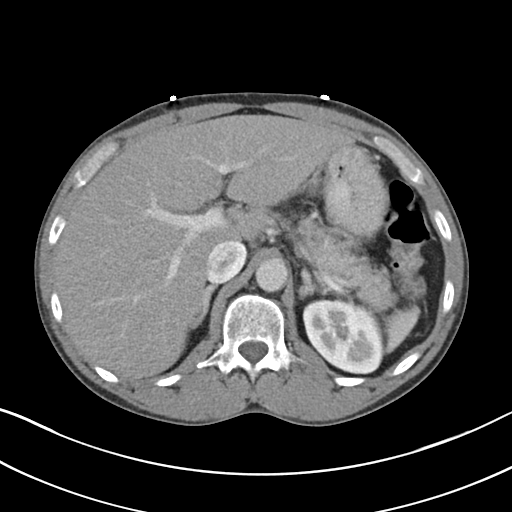
[im 75/84  soft-tissue]
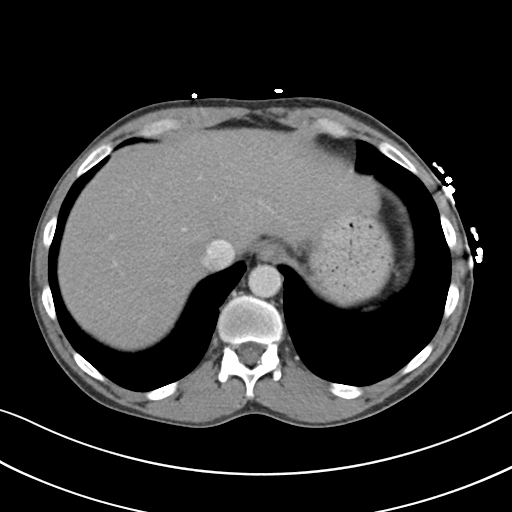
[im 79/84  soft-tissue]
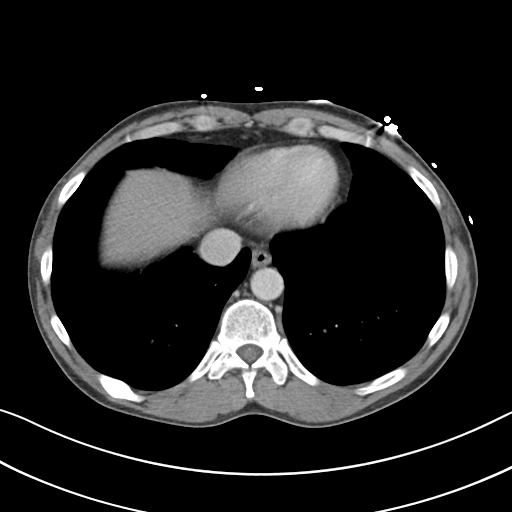

[Series 5: coronals · coronal · 0.70mm/px · 3 of 181 slices shown]
[im 61/181  soft-tissue]
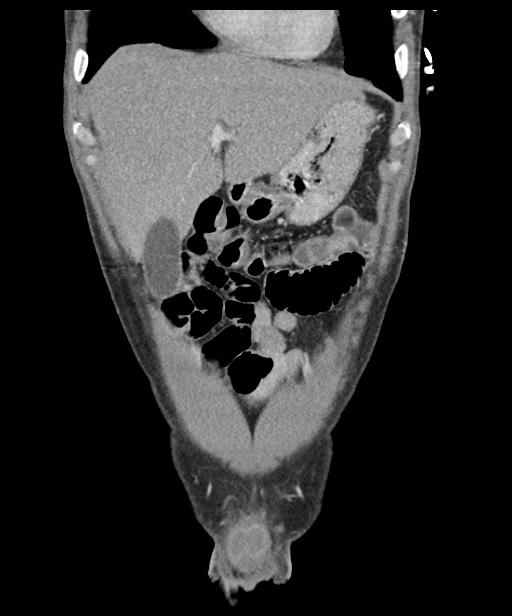
[im 81/181  soft-tissue]
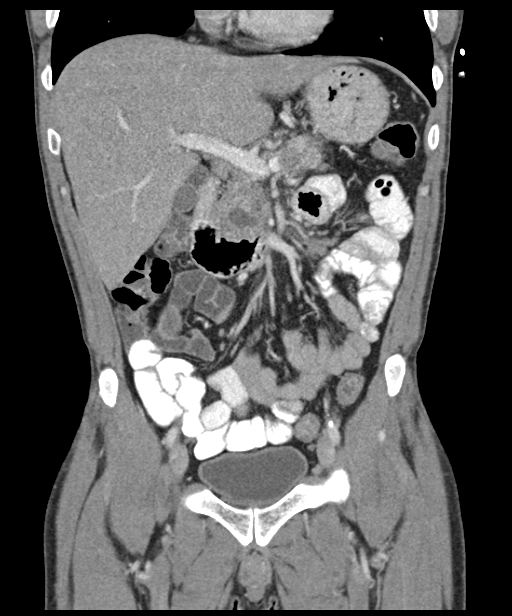
[im 101/181  soft-tissue]
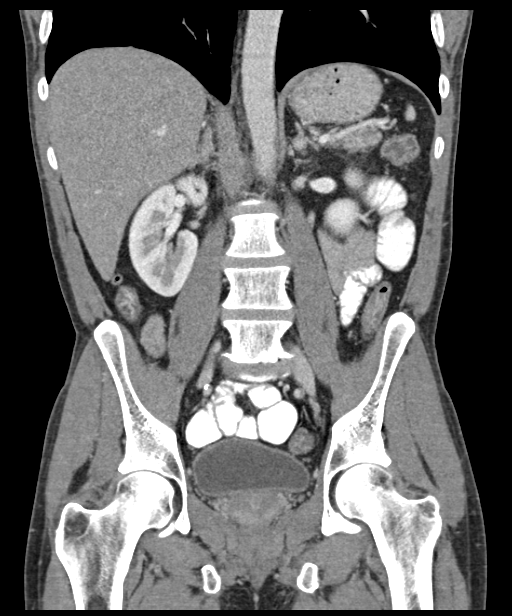

[17 of 46 positions shown; findings below may reference images not displayed]

FINDINGS: Lung bases are clear.

Fatty infiltration of the liver without focal liver lesion.
Gallbladder is elongated and distended but not thickened. Common
bile duct nondilated. Pancreas is small.

Changes of pancreatitis have improved. There is less peripancreatic
edema and pancreatic swelling compared with the prior study. 23 x 12
mm cyst in the uncinate process similar to the prior study
compatible with a pseudocyst.

Negative for bowel obstruction. Scattered air-fluid levels may
represent mild ileus. No ascites. Normal appendix.

Negative for mass or adenopathy. Prostate mildly enlarged. Mild
bladder wall thickening unchanged.

No acute bony change. Disc degeneration in the lumbar spine
especially L5-S1.
IMPRESSION: Interval improvement in pancreatic edema and changes of
pancreatitis. 23 x 12 mm pseudocyst in the uncinate process similar
to the prior study. No ascites.

Fatty liver

## 2016-07-03 ENCOUNTER — Encounter (HOSPITAL_COMMUNITY): Payer: Self-pay

## 2016-07-03 ENCOUNTER — Emergency Department (HOSPITAL_COMMUNITY): Payer: Self-pay

## 2016-07-03 ENCOUNTER — Emergency Department (HOSPITAL_COMMUNITY)
Admission: EM | Admit: 2016-07-03 | Discharge: 2016-07-03 | Disposition: A | Discharge status: Home / Self Care | Payer: Self-pay | Attending: Emergency Medicine | Admitting: Emergency Medicine

## 2016-07-03 DIAGNOSIS — I10 Essential (primary) hypertension: Secondary | ICD-10-CM | POA: Insufficient documentation

## 2016-07-03 DIAGNOSIS — J45909 Unspecified asthma, uncomplicated: Secondary | ICD-10-CM | POA: Insufficient documentation

## 2016-07-03 DIAGNOSIS — F17210 Nicotine dependence, cigarettes, uncomplicated: Secondary | ICD-10-CM | POA: Insufficient documentation

## 2016-07-03 DIAGNOSIS — F419 Anxiety disorder, unspecified: Secondary | ICD-10-CM | POA: Insufficient documentation

## 2016-07-03 LAB — CBC WITH DIFFERENTIAL/PLATELET
BASOS PCT: 0 %
Basophils Absolute: 0 10*3/uL (ref 0.0–0.1)
Eosinophils Absolute: 0.5 10*3/uL (ref 0.0–0.7)
Eosinophils Relative: 6 %
HCT: 35.2 % — ABNORMAL LOW (ref 39.0–52.0)
HEMOGLOBIN: 11.5 g/dL — AB (ref 13.0–17.0)
LYMPHS ABS: 3.4 10*3/uL (ref 0.7–4.0)
Lymphocytes Relative: 37 %
MCH: 29.8 pg (ref 26.0–34.0)
MCHC: 32.7 g/dL (ref 30.0–36.0)
MCV: 91.2 fL (ref 78.0–100.0)
Monocytes Absolute: 0.7 10*3/uL (ref 0.1–1.0)
Monocytes Relative: 8 %
NEUTROS PCT: 49 %
Neutro Abs: 4.6 10*3/uL (ref 1.7–7.7)
Platelets: 304 10*3/uL (ref 150–400)
RBC: 3.86 MIL/uL — AB (ref 4.22–5.81)
RDW: 15 % (ref 11.5–15.5)
WBC: 9.3 10*3/uL (ref 4.0–10.5)

## 2016-07-03 LAB — BASIC METABOLIC PANEL
ANION GAP: 9 (ref 5–15)
BUN: 7 mg/dL (ref 6–20)
CALCIUM: 9.2 mg/dL (ref 8.9–10.3)
CHLORIDE: 102 mmol/L (ref 101–111)
CO2: 29 mmol/L (ref 22–32)
CREATININE: 0.63 mg/dL (ref 0.61–1.24)
GFR calc non Af Amer: >60 mL/min (ref >60)
Glucose, Bld: 85 mg/dL (ref 65–99)
Potassium: 4.1 mmol/L (ref 3.5–5.1)
SODIUM: 140 mmol/L (ref 135–145)

## 2016-07-03 LAB — I-STAT TROPONIN, ED: Troponin i, poc: 0 ng/mL (ref 0.00–0.08)

## 2016-07-03 LAB — BRAIN NATRIURETIC PEPTIDE: B Natriuretic Peptide: 52.9 pg/mL (ref 0.0–100.0)

## 2016-07-03 LAB — ETHANOL

## 2016-07-03 NOTE — ED Triage Notes (Signed)
Patient transported via [facility]. EMS reports shortness of breath starting mid-day yesterday. SOB worsened overnight and mid-sternal chestpain started at 9am this morning. EMS reports patient stated pain 8/10  EMS gave 324 asa and 1 nitro. 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

## 2016-07-03 NOTE — ED Provider Notes (Signed)
[facility] DEPT Provider Note   CSN: [id number] Arrival date & time: 07/03/16  1253     History   Chief Complaint Chief Complaint  Patient presents with  . Chest Pain  . Shortness of Breath    HPI [name] is a 47 y.o. male.  The history is provided by the patient.  Chest Pain   This is a recurrent problem. The current episode started 6 to 12 hours ago. The problem occurs constantly. The problem has not changed since onset.Associated with: breaking up with his SO. The pain is present in the substernal region. The pain is mild. The quality of the pain is described as sharp. The pain does not radiate. He has tried nothing for the symptoms. Risk factors include male gender.    Past Medical History:  Diagnosis Date  . Alcoholism /alcohol abuse ([place])   . Anemia   . Anxiety   . Arthritis    "knees; arms; elbows" (03/26/2015)  . Asthma   . Bipolar disorder ([place])   . Chronic bronchitis ([place])   . Chronic lower back pain   . Cocaine abuse   . Depression   . Family history of adverse reaction to anesthesia    "grandmother gets confused"  . Femoral condyle fracture ([place]) 03/08/2014   left medial/notes 03/09/2014  . GERD (gastroesophageal reflux disease)   . H/O hiatal hernia   . H/O suicide attempt 10/2012  . Heart murmur    "when he was little" (03/06/2013)  . High cholesterol   . History of blood transfusion 10/2012   "when I tried to commit suicide"  . History of stomach ulcers   . Hypertension   . Marijuana abuse, continuous   . Migraine    "a few times/year" (03/26/2015)  . Pancreatitis   . Pneumonia 1990's X 3  . PTSD (post-traumatic stress disorder)   . Shortness of breath    "can happen at anytime" (03/06/2013)  . Sickle cell trait ([place])   . WPW (Wolff-Parkinson-White syndrome)    [name] 03/06/2013    Patient Active Problem List   Diagnosis Date Noted  . Leukocytosis   . Hospital acquired PNA 05/20/2015  . Pancreatitis 05/18/2015  . Pseudocyst  of pancreas 05/18/2015  . Malnutrition of moderate degree 03/27/2015  . Abnormal transaminases 03/26/2015  . Polysubstance abuse (tobacco, cocaine, THC, and ETOH) 03/26/2015  . Acute pancreatitis 03/26/2015  . Alcohol-induced chronic pancreatitis ([place])   . Essential hypertension 02/06/2014  . Alcohol-induced acute pancreatitis 11/28/2013  . Pancreatic pseudocyst/cyst 11/25/2013  . Alcohol dependence ([name]) 10/23/2013  . 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 Protein-calorie malnutrition, severe ([place]) 10/10/2013  . Suicide attempt 10/08/2013  . Overdose 10/06/2013  . 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 TOBACCO ABUSE 03/23/2007    Past Surgical History:  Procedure Laterality Date  . CARDIAC CATHETERIZATION    . EYE SURGERY Left 1990's   "result of trauma"   . FACIAL FRACTURE SURGERY Left 1990's   "result of trauma"   . FRACTURE SURGERY    . HERNIA REPAIR    . LEFT HEART CATHETERIZATION WITH CORONARY ANGIOGRAM Right 03/07/2013   Procedure: LEFT HEART CATHETERIZATION WITH CORONARY ANGIOGRAM;  Surgeon: [name], MD;  Location: [facility] CATH [facility];  Service: Cardiovascular;  Laterality: Right;  . UMBILICAL HERNIA REPAIR         Home Medications    Prior to Admission medications   Medication Sig Start Date  End Date Taking? Authorizing Provider  acetaminophen-codeine (TYLENOL #3) 300-30 MG tablet Take 2 tablets by mouth every 4 (four) hours as needed for moderate pain.   Yes Historical Provider, MD  albuterol (PROVENTIL HFA;VENTOLIN HFA) 108 (90 BASE) MCG/ACT inhaler Inhale 2 puffs into the lungs every 6 (six) hours as needed for wheezing or shortness of breath (wheezing).    Yes Historical Provider, MD  Calcium Carbonate Antacid (MAALOX) 600 MG chewable tablet Chew 1 tablet (600 mg total) by mouth 2 (two) times daily. 06/15/16  Yes Nicole Pisciotta, PA-C  cholestyramine (QUESTRAN) 4 g packet Take 1  packet (4 g total) by mouth 3 (three) times daily as needed (diarrhea). [id number]  Yes Delora Fuel, MD  fluticasone [facility]) 50 MCG/ACT nasal spray Place 1 spray into both nostrils daily as needed for allergies.    Yes Historical Provider, MD  lipase/protease/amylase (CREON) 12000 UNITS CPEP capsule Take 2 capsules (24,000 Units total) by mouth 3 (three) times daily with meals. 01/15/15  Yes [name], MD  metoprolol tartrate (LOPRESSOR) 25 MG tablet Take 1 tablet (25 mg total) by mouth 2 (two) times daily. 12/14/15  Yes Tiffany Daneil Dan, PA-C  omeprazole (PRILOSEC) 20 MG capsule Take 1 capsule (20 mg total) by mouth daily. [id number]  Yes Delora Fuel, MD  oxyCODONE-acetaminophen (PERCOCET/ROXICET) 5-325 MG tablet Take 1-2 tablets by mouth every 6 (six) hours as needed for severe pain. 06/12/16  Yes Lajean Saver, MD  sertraline (ZOLOFT) 25 MG tablet Take 75 mg by mouth daily.   Yes Historical Provider, MD  sucralfate (CARAFATE) 1 g tablet Take 1 tablet (1 g total) by mouth 4 (four) times daily -  with meals and at bedtime. 11/15/15  Yes Burtrum Lions, PA-C  amoxicillin (AMOXIL) 500 MG capsule Take 500 mg by mouth 3 (three) times daily.    Historical Provider, MD  clindamycin (CLEOCIN) 300 MG capsule Take 1 capsule (300 mg total) by mouth 3 (three) times daily. Patient not taking: Reported on 04/24/2016 02/21/16   Kristen N Ward, DO  diphenoxylate-atropine (LOMOTIL) 2.5-0.025 MG tablet Take 1 tablet by mouth 4 (four) times daily as needed for diarrhea or loose stools. Patient not taking: Reported on 04/24/2016 12/04/15   [name], PA-C  ondansetron (ZOFRAN ODT) 4 MG disintegrating tablet Take 1 tablet (4 mg total) by mouth every 8 (eight) hours as needed for nausea or vomiting. Patient not taking: Reported on 04/24/2016 04/17/16   Deno Etienne, DO  ondansetron (ZOFRAN ODT) 8 MG disintegrating tablet Take 1 tablet (8 mg total) by mouth every 8 (eight) hours as needed for nausea or  vomiting. Patient not taking: Reported on 06/12/2016 01/25/16   [name], MD  pantoprazole (PROTONIX) 20 MG tablet Take 2 tablets (40 mg total) by mouth daily. Patient not taking: Reported on 04/24/2016 04/11/16   Merryl Hacker, MD  sildenafil (VIAGRA) 50 MG tablet Take 1 tablet (50 mg total) by mouth daily as needed for erectile dysfunction. 12/14/15   Brayton Caves, PA-C    Family History Family History  Problem Relation Age of Onset  . Hypertension Other   . Coronary artery disease Other     Social History Social History  Substance Use Topics  . Smoking status: Current Every Day Smoker    Packs/day: 1.00    Years: 30.00    Types: Cigarettes  . Smokeless tobacco: Current User    Types: Chew  . Alcohol use 7.2 oz/week    12 Cans of beer  per week     Allergies   Shellfish-derived products; Trazodone and nefazodone; and Reglan [metoclopramide]   Review of Systems Review of Systems  Cardiovascular: Positive for chest pain.  All other systems reviewed and are negative.    Physical Exam Updated Vital Signs BP 127/83   Pulse 70   Temp 98.2 F (36.8 C) (Oral)   Resp 17   Ht 5\' 8"  (1.727 m)   Wt 145 lb (65.8 kg)   SpO2 98%   BMI 22.05 kg/m   Physical Exam  Constitutional: He is oriented to person, place, and time. He appears well-developed and well-nourished. No distress.  HENT:  Head: Normocephalic and atraumatic.  Nose: Nose normal.  Eyes: Conjunctivae are normal.  Neck: Neck supple. No tracheal deviation present.  Cardiovascular: Normal rate, regular rhythm and normal heart sounds.   Pulmonary/Chest: Effort normal and breath sounds normal. No respiratory distress. He has no wheezes. He has no rales. He exhibits no tenderness.  Abdominal: Soft. He exhibits no distension. There is no tenderness.  Neurological: He is alert and oriented to person, place, and time.  Skin: Skin is warm and dry.  Psychiatric: He has a normal mood and affect.  Vitals  reviewed.    ED Treatments / Results  Labs (all labs ordered are listed, but only abnormal results are displayed) Labs Reviewed  CBC WITH DIFFERENTIAL/PLATELET - Abnormal; Notable for the following:       Result Value   RBC 3.86 (*)    Hemoglobin 11.5 (*)    HCT 35.2 (*)    All other components within normal limits  BASIC METABOLIC PANEL  BRAIN NATRIURETIC PEPTIDE  ETHANOL  I-STAT TROPOININ, ED    EKG  EKG Interpretation  Date/Time:  Sunday July 03 2016 13:00:49 EST Ventricular Rate:  70 PR Interval:    QRS Duration: 85 QT Interval:  320 QTC Calculation: 346 R Axis:   72 Text Interpretation:  Sinus rhythm Short PR interval Borderline T abnormalities, inferior leads No significant change since last tracing Confirmed by  MD,  ([id number]) on 07/03/2016 1:09:56 PM       Radiology Dg Chest 2 View  Result Date: 07/03/2016 CLINICAL DATA:  Shortness of breath left chest pain beginning this morning. EXAM: CHEST  2 VIEW COMPARISON:  PA and lateral chest 02/20/2016 and 03/05/2015. CT chest 11/26/2014. FINDINGS: Emphysematous change is seen in the apices. Lungs are clear. Heart size is normal. No pneumothorax or pleural effusion. IMPRESSION: Emphysematous disease in the apices.  No acute disease. Electronically Signed   By: Thomas  Dalessio M.D.   On: 07/03/2016 14:24    Procedures Procedures (including critical care time)  Medications Ordered in ED Medications - No data to display   Initial Impression / Assessment and Plan / ED Course  I have reviewed the triage vital signs and the nursing notes.  Pertinent labs & imaging results that were available during my care of the patient were reviewed by me and considered in my medical decision making (see chart for details).     45  y.o. male presents with left sided sharp chest pain and some shortness of breath today. He attributes this to breaking up with his SO recently. Workup today is negative. We talked about  exercise as a good stress reliever as opposed to medication. Plan to follow up with PCP as needed and return precautions discussed for worsening or new concerning symptoms.   Final Clinical Impressions(s) / ED Diagnoses   Final diagnoses:  Anxiousness  New Prescriptions New Prescriptions   No medications on file     Leo Grosser, MD 07/03/16 [phone]

## 2016-07-16 ENCOUNTER — Emergency Department (HOSPITAL_COMMUNITY)
Admission: EM | Admit: 2016-07-16 | Discharge: 2016-07-16 | Disposition: A | Discharge status: Home / Self Care | Payer: Self-pay | Attending: Emergency Medicine | Admitting: Emergency Medicine

## 2016-07-16 ENCOUNTER — Emergency Department (HOSPITAL_COMMUNITY): Payer: Self-pay

## 2016-07-16 ENCOUNTER — Encounter (HOSPITAL_COMMUNITY): Payer: Self-pay | Admitting: Nurse Practitioner

## 2016-07-16 DIAGNOSIS — F17210 Nicotine dependence, cigarettes, uncomplicated: Secondary | ICD-10-CM | POA: Insufficient documentation

## 2016-07-16 DIAGNOSIS — Z79899 Other long term (current) drug therapy: Secondary | ICD-10-CM | POA: Insufficient documentation

## 2016-07-16 DIAGNOSIS — Z955 Presence of coronary angioplasty implant and graft: Secondary | ICD-10-CM | POA: Insufficient documentation

## 2016-07-16 DIAGNOSIS — K852 Alcohol induced acute pancreatitis without necrosis or infection: Secondary | ICD-10-CM | POA: Insufficient documentation

## 2016-07-16 DIAGNOSIS — R1012 Left upper quadrant pain: Secondary | ICD-10-CM

## 2016-07-16 DIAGNOSIS — J45909 Unspecified asthma, uncomplicated: Secondary | ICD-10-CM | POA: Insufficient documentation

## 2016-07-16 DIAGNOSIS — I10 Essential (primary) hypertension: Secondary | ICD-10-CM | POA: Insufficient documentation

## 2016-07-16 LAB — CBC
HEMATOCRIT: 30.5 % — AB (ref 39.0–52.0)
Hemoglobin: 10.1 g/dL — ABNORMAL LOW (ref 13.0–17.0)
MCH: 29.3 pg (ref 26.0–34.0)
MCHC: 33.1 g/dL (ref 30.0–36.0)
MCV: 88.4 fL (ref 78.0–100.0)
Platelets: 307 10*3/uL (ref 150–400)
RBC: 3.45 MIL/uL — ABNORMAL LOW (ref 4.22–5.81)
RDW: 15.2 % (ref 11.5–15.5)
WBC: 8.3 10*3/uL (ref 4.0–10.5)

## 2016-07-16 LAB — COMPREHENSIVE METABOLIC PANEL
ALBUMIN: 3.8 g/dL (ref 3.5–5.0)
ALT: 41 U/L (ref 17–63)
AST: 60 U/L — ABNORMAL HIGH (ref 15–41)
Alkaline Phosphatase: 81 U/L (ref 38–126)
Anion gap: 7 (ref 5–15)
BILIRUBIN TOTAL: 1 mg/dL (ref 0.3–1.2)
BUN: 7 mg/dL (ref 6–20)
CHLORIDE: 110 mmol/L (ref 101–111)
CO2: 23 mmol/L (ref 22–32)
Calcium: 8.7 mg/dL — ABNORMAL LOW (ref 8.9–10.3)
Creatinine, Ser: 0.86 mg/dL (ref 0.61–1.24)
GFR calc Af Amer: >60 mL/min (ref >60)
GFR calc non Af Amer: >60 mL/min (ref >60)
GLUCOSE: 77 mg/dL (ref 65–99)
POTASSIUM: 5.1 mmol/L (ref 3.5–5.1)
Sodium: 140 mmol/L (ref 135–145)
Total Protein: 7.2 g/dL (ref 6.5–8.1)

## 2016-07-16 LAB — URINALYSIS, ROUTINE W REFLEX MICROSCOPIC
BILIRUBIN URINE: NEGATIVE
Glucose, UA: NEGATIVE mg/dL
HGB URINE DIPSTICK: NEGATIVE
KETONES UR: NEGATIVE mg/dL
Leukocytes, UA: NEGATIVE
Nitrite: NEGATIVE
PH: 5 (ref 5.0–8.0)
Protein, ur: NEGATIVE mg/dL
Specific Gravity, Urine: 1.019 (ref 1.005–1.030)

## 2016-07-16 LAB — PROTIME-INR
INR: 1
Prothrombin Time: 13.2 seconds (ref 11.4–15.2)

## 2016-07-16 LAB — LIPASE, BLOOD: LIPASE: 45 U/L (ref 11–51)

## 2016-07-16 LAB — I-STAT CG4 LACTIC ACID, ED
Lactic Acid, Venous: 1.45 mmol/L (ref 0.5–1.9)
Lactic Acid, Venous: 1.15 mmol/L (ref 0.5–1.9)

## 2016-07-16 MED ORDER — ONDANSETRON HCL 4 MG/2ML IJ SOLN
4.0000 mg | Freq: Once | INTRAMUSCULAR | Status: AC
  Administered 2016-07-16: 4 mg via INTRAVENOUS
  Filled 2016-07-16: qty 2

## 2016-07-16 MED ORDER — IOPAMIDOL (ISOVUE-300) INJECTION 61%
INTRAVENOUS | Status: AC
  Filled 2016-07-16: qty 100

## 2016-07-16 MED ORDER — MORPHINE SULFATE (PF) 4 MG/ML IV SOLN
4.0000 mg | Freq: Once | INTRAVENOUS | Status: AC
  Administered 2016-07-16: 4 mg via INTRAVENOUS
  Filled 2016-07-16: qty 1

## 2016-07-16 MED ORDER — SODIUM CHLORIDE 0.9 % IV BOLUS (SEPSIS)
1000.0000 mL | Freq: Once | INTRAVENOUS | Status: AC
  Administered 2016-07-16: 1000 mL via INTRAVENOUS

## 2016-07-16 MED ORDER — GI COCKTAIL ~~LOC~~
30.0000 mL | Freq: Once | ORAL | Status: AC
  Administered 2016-07-16: 30 mL via ORAL
  Filled 2016-07-16: qty 30

## 2016-07-16 MED ORDER — IOPAMIDOL (ISOVUE-300) INJECTION 61%
100.0000 mL | Freq: Once | INTRAVENOUS | Status: AC | PRN
  Administered 2016-07-16: 100 mL via INTRAVENOUS

## 2016-07-16 MED ORDER — ONDANSETRON HCL 4 MG PO TABS: 4.0000 mg | ORAL_TABLET | Freq: Three times a day (TID) | ORAL | 0 refills | Status: DC | PRN

## 2016-07-16 NOTE — ED Triage Notes (Signed)
Pt states he is having problems with his pancreatitis and abdominal pain. All started 24 hrs ago when she had last alcohol drink.
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
## 2016-07-16 NOTE — Discharge Instructions (Signed)
Please take your nausea medicine to help with your vomiting. Please stay hydrated. Please avoid alcohol as this likely set off her pain over the last 2 days. If any symptoms acutely worsen, please return to the nearest emergency department and please follow-up with your primary care physician for ongoing management.

## 2016-07-17 ENCOUNTER — Ambulatory Visit (HOSPITAL_COMMUNITY)
Admission: RE | Admit: 2016-07-17 | Discharge: 2016-07-17 | Disposition: A | Discharge status: Home / Self Care | Payer: No Typology Code available for payment source | Attending: Psychiatry | Admitting: Psychiatry

## 2016-07-17 ENCOUNTER — Emergency Department (HOSPITAL_COMMUNITY)
Admission: EM | Admit: 2016-07-17 | Discharge: 2016-07-17 | Disposition: A | Discharge status: Home / Self Care | Payer: Self-pay | Attending: Emergency Medicine | Admitting: Emergency Medicine

## 2016-07-17 ENCOUNTER — Encounter (HOSPITAL_COMMUNITY): Payer: Self-pay | Admitting: Emergency Medicine

## 2016-07-17 DIAGNOSIS — R1013 Epigastric pain: Secondary | ICD-10-CM

## 2016-07-17 DIAGNOSIS — J45909 Unspecified asthma, uncomplicated: Secondary | ICD-10-CM | POA: Insufficient documentation

## 2016-07-17 DIAGNOSIS — K2900 Acute gastritis without bleeding: Secondary | ICD-10-CM

## 2016-07-17 DIAGNOSIS — M1990 Unspecified osteoarthritis, unspecified site: Secondary | ICD-10-CM | POA: Insufficient documentation

## 2016-07-17 DIAGNOSIS — F1010 Alcohol abuse, uncomplicated: Secondary | ICD-10-CM | POA: Insufficient documentation

## 2016-07-17 DIAGNOSIS — K219 Gastro-esophageal reflux disease without esophagitis: Secondary | ICD-10-CM | POA: Insufficient documentation

## 2016-07-17 DIAGNOSIS — F17210 Nicotine dependence, cigarettes, uncomplicated: Secondary | ICD-10-CM | POA: Insufficient documentation

## 2016-07-17 DIAGNOSIS — D573 Sickle-cell trait: Secondary | ICD-10-CM | POA: Insufficient documentation

## 2016-07-17 DIAGNOSIS — K859 Acute pancreatitis without necrosis or infection, unspecified: Secondary | ICD-10-CM | POA: Insufficient documentation

## 2016-07-17 DIAGNOSIS — F319 Bipolar disorder, unspecified: Secondary | ICD-10-CM | POA: Insufficient documentation

## 2016-07-17 DIAGNOSIS — E785 Hyperlipidemia, unspecified: Secondary | ICD-10-CM | POA: Insufficient documentation

## 2016-07-17 DIAGNOSIS — I10 Essential (primary) hypertension: Secondary | ICD-10-CM | POA: Insufficient documentation

## 2016-07-17 DIAGNOSIS — F419 Anxiety disorder, unspecified: Secondary | ICD-10-CM | POA: Insufficient documentation

## 2016-07-17 DIAGNOSIS — D649 Anemia, unspecified: Secondary | ICD-10-CM | POA: Insufficient documentation

## 2016-07-17 MED ORDER — ONDANSETRON HCL 4 MG/2ML IJ SOLN
4.0000 mg | Freq: Once | INTRAMUSCULAR | Status: AC
  Administered 2016-07-17: 4 mg via INTRAVENOUS
  Filled 2016-07-17: qty 2

## 2016-07-17 MED ORDER — ACETAMINOPHEN 325 MG PO TABS
650.0000 mg | ORAL_TABLET | Freq: Once | ORAL | Status: AC
  Administered 2016-07-17: 650 mg via ORAL
  Filled 2016-07-17: qty 2

## 2016-07-17 MED ORDER — MORPHINE SULFATE (PF) 4 MG/ML IV SOLN
4.0000 mg | Freq: Once | INTRAVENOUS | Status: AC
  Administered 2016-07-17: 4 mg via INTRAVENOUS
  Filled 2016-07-17: qty 1

## 2016-07-17 MED ORDER — FAMOTIDINE 20 MG PO TABS
20.0000 mg | ORAL_TABLET | Freq: Once | ORAL | Status: AC
  Administered 2016-07-17: 20 mg via ORAL
  Filled 2016-07-17: qty 1

## 2016-07-17 MED ORDER — ALUM & MAG HYDROXIDE-SIMETH 200-200-20 MG/5ML PO SUSP
30.0000 mL | Freq: Once | ORAL | Status: AC
Start: 2016-07-17 — End: 2016-07-17
  Administered 2016-07-17: 30 mL via ORAL
  Filled 2016-07-17: qty 30
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
## 2016-07-17 NOTE — H&P (Signed)
Behavioral Health Medical Screening Exam  [name] is an 46 y.o. male.  Total Time spent with patient: 15 minutes  Psychiatric Specialty Exam: Physical Exam  Constitutional: He is oriented to person, place, and time. He appears well-developed and well-nourished.  Eyes: Conjunctivae are normal. Pupils are equal, round, and reactive to light.  Neck: Normal range of motion.  Cardiovascular: Normal rate and regular rhythm.   Respiratory: Effort normal and breath sounds normal.  GI: Soft. Bowel sounds are normal.  Musculoskeletal: Normal range of motion.  Neurological: He is alert and oriented to person, place, and time.  Skin: Skin is warm and dry.    Review of Systems  Constitutional: Positive for malaise/fatigue.  Gastrointestinal: Positive for abdominal pain.  Psychiatric/Behavioral: Positive for depression and substance abuse. Negative for hallucinations and suicidal ideas. The patient is nervous/anxious and has insomnia.   All other systems reviewed and are negative.   There were no vitals taken for this visit.There is no height or weight on file to calculate BMI.  General Appearance: Casual and Fairly Groomed  Eye Contact:  Good  Speech:  Clear and Coherent and Normal Rate  Volume:  Normal  Mood:  Anxious and Depressed  Affect:  Appropriate, Congruent and Depressed  Thought Process:  Coherent, Goal Directed, Linear and Descriptions of Associations: Intact  Orientation:  Full (Time, Place, and Person)  Thought Content:  Focused on pancreatitis and wanting treatment for alcohol outpatient  Suicidal Thoughts:  No  Homicidal Thoughts:  No  Memory:  Immediate;   Fair Recent;   Fair Remote;   Fair  Judgement:  Fair  Insight:  Fair  Psychomotor Activity:  Normal  Concentration: Concentration: Fair and Attention Span: Fair  Recall:  [employer] of Knowledge:Fair  Language: Fair  Akathisia:  No  Handed:    AIMS (if indicated):     Assets:  Communication  Skills Desire for Improvement Resilience Social Support  Sleep:       Musculoskeletal: Strength & Muscle Tone: within normal limits Gait & Station: normal Patient leans: N/A  BP: 135/90 HR 71 O2 100 RA, T 99.2 RR 18  Recommendations:  Based on my evaluation the patient does not appear to have an emergency medical condition. He was just discharged from [facility] where he received IV narcotics for his pancreatitis; he has been to the ED 17 times in 6 months for similar complaints. He walked in today requesting resources (or admission) for ETOH abuse yet does not appear to be in any type of danger of withdrawal at this time nor does he appear to be acutely intoxicated. Provided pt with outpatient resources which EPIC indicates he has been given many times.   Benjamine Mola, FNP 07/17/2016, 7:51 PM

## 2016-07-17 NOTE — ED Notes (Signed)
Bed: WA17 Expected date: 07/17/16 Expected time: 2:09 PM Means of arrival:  Comments: abd pain

## 2016-07-17 NOTE — BH Assessment (Signed)
Assessment Note   [name] is an 46 y.o. male presented to [facility] walk in requesting alcohol detox. Currently, he denies SI, HI and AVH. Pt reports he restarted his psych meds on Thursday, March 8th. He receives his meds from [facility].   Diagnosis: Alcohol use disorder  Past Medical History:  Past Medical History:  Diagnosis Date  . Alcoholism /alcohol abuse ([place])   . Anemia   . Anxiety   . Arthritis    "knees; arms; elbows" (03/26/2015)  . Asthma   . Bipolar disorder ([place])   . Chronic bronchitis ([place])   . Chronic lower back pain   . Cocaine abuse   . Depression   . Family history of adverse reaction to anesthesia    "grandmother gets confused"  . Femoral condyle fracture ([place]) 03/08/2014   left medial/notes 03/09/2014  . GERD (gastroesophageal reflux disease)   . H/O hiatal hernia   . H/O suicide attempt 10/2012  . Heart murmur    "when he was little" (03/06/2013)  . High cholesterol   . History of blood transfusion 10/2012   "when I tried to commit suicide"  . History of stomach ulcers   . Hypertension   . Marijuana abuse, continuous   . Migraine    "a few times/year" (03/26/2015)  . Pancreatitis   . Pneumonia 1990's X 3  . PTSD (post-traumatic stress disorder)   . Shortness of breath    "can happen at anytime" (03/06/2013)  . Sickle cell trait ([place])   . WPW (Wolff-Parkinson-White syndrome)    [name] 03/06/2013    Past Surgical History:  Procedure Laterality Date  . CARDIAC CATHETERIZATION    . EYE SURGERY Left 1990's   "result of trauma"   . FACIAL FRACTURE SURGERY Left 1990's   "result of trauma"   . FRACTURE SURGERY    . HERNIA REPAIR    . LEFT HEART CATHETERIZATION WITH CORONARY ANGIOGRAM Right 03/07/2013   Procedure: LEFT HEART CATHETERIZATION WITH CORONARY ANGIOGRAM;  Surgeon: [name], MD;  Location: [place] CATH LAB;  Service: Cardiovascular;  Laterality: Right;  . UMBILICAL HERNIA REPAIR      Family History:  Family History  Problem  Relation Age of Onset  . Hypertension Other   . Coronary artery disease Other     Social History:  reports that he has been smoking Cigarettes.  He has a 30.00 pack-year smoking history. His smokeless tobacco use includes Chew. He reports that he drinks about 7.2 oz of alcohol per week . He reports that he uses drugs, including Marijuana.  Additional Social History:  Alcohol / Drug Use Pain Medications: See PTA meds Prescriptions: See PTA meds Over the Counter: See PTA meds History of alcohol / drug use?: Yes Negative Consequences of Use: Legal, Work / School Withdrawal Symptoms: Other (Comment) (Denies) Substance #1 Name of Substance 1: Alcohol  1 - Amount (size/oz): 12 pack of beer  1 - Frequency: Daily  1 - Last Use / Amount: Today  Substance #2 Name of Substance 2: Marijuana  2 - Amount (size/oz): Unknown 2 - Frequency: "on weekends."  2 - Last Use / Amount: Today   CIWA:   COWS:    PATIENT STRENGTHS: (choose at least two) Average or above average intelligence Capable of independent living Communication skills  Allergies:  Allergies  Allergen Reactions  . Shellfish-Derived Products Nausea And Vomiting  . Trazodone And Nefazodone Other (See Comments)    Muscle spasms  . Reglan [Metoclopramide] Other (See  Comments)    Unknown    Home Medications:  (Not in a hospital admission)  OB/GYN Status:  No LMP for male patient.  General Assessment Data Location of Assessment: [facility] TTS Assessment: In system Is this a Tele or Face-to-Face Assessment?: Face-to-Face Is this an Initial Assessment or a Re-assessment for this encounter?: Initial Assessment Marital status: Single Living Arrangements: Parent Can pt return to current living arrangement?: Yes Admission Status: Voluntary Is patient capable of signing voluntary admission?: Yes Referral Source: Self/Family/Friend Insurance type: None  Medical Screening Exam ([place]) Medical Exam  completed: Yes  Crisis Care Plan Living Arrangements: Parent Name of Psychiatrist: [facility] NP Name of Therapist: None   Education Status Is patient currently in school?: No  Risk to self with the past 6 months Suicidal Ideation: No Has patient been a risk to self within the past 6 months prior to admission? : No Suicidal Intent: No Has patient had any suicidal intent within the past 6 months prior to admission? : No Is patient at risk for suicide?: No Suicidal Plan?: No Has patient had any suicidal plan within the past 6 months prior to admission? : No Access to Means: No What has been your use of drugs/alcohol within the last 12 months?: Alcohol and Marijuana Previous Attempts/Gestures: Yes How many times?: 1 Other Self Harm Risks: None  Triggers for Past Attempts: Spouse contact Intentional Self Injurious Behavior: None Family Suicide History: Unknown Recent stressful life event(s): Financial Problems, Other (Comment) (Unemployment) Persecutory voices/beliefs?: No Depression: Yes Depression Symptoms: Tearfulness, Guilt, Feeling angry/irritable, Feeling worthless/self pity, Isolating Substance abuse history and/or treatment for substance abuse?: Yes Suicide prevention information given to non-admitted patients: Yes  Risk to Others within the past 6 months Homicidal Ideation: No Does patient have any lifetime risk of violence toward others beyond the six months prior to admission? : No Thoughts of Harm to Others: No Current Homicidal Intent: No Current Homicidal Plan: No Access to Homicidal Means: No Identified Victim: None  History of harm to others?: No Assessment of Violence: None Noted Violent Behavior Description: None  Does patient have access to weapons?: No Criminal Charges Pending?: No Does patient have a court date: No Is patient on probation?: No  Psychosis Hallucinations: None noted Delusions: None noted  Mental Status Report Appearance/Hygiene:  Unremarkable Eye Contact: Good Motor Activity: Unremarkable Speech: Logical/coherent Level of Consciousness: Alert Mood: Depressed Affect: Depressed Anxiety Level: Minimal Thought Processes: Coherent Judgement: Unimpaired Orientation: Person, Place, Time, Situation, Appropriate for developmental age Obsessive Compulsive Thoughts/Behaviors: None  Cognitive Functioning Concentration: Unable to Assess Memory: Recent Intact IQ: Average Insight: Fair Impulse Control: Good Appetite: Fair Weight Loss: 0 Weight Gain: 0 Sleep: Decreased Total Hours of Sleep: 5 Vegetative Symptoms: None  ADLScreening [facility] Assessment Services) Patient's cognitive ability adequate to safely complete daily activities?: Yes Independently performs ADLs?: Yes (appropriate for developmental age)  Prior Inpatient Therapy Prior Inpatient Therapy: Yes Prior Therapy Dates: 2001 and 2015 Prior Therapy Facilty/Provider(s): Out of state facility and [facility] Reason for Treatment: SI and substance abuse  Prior Outpatient Therapy Prior Outpatient Therapy: Yes Prior Therapy Dates: 2 years ago Prior Therapy Facilty/Provider(s): [facility] Reason for Treatment: Depression Does patient have an ACCT team?: No Does patient have [facility] services? : Yes Does patient have [facility] services?: No  ADL Screening (condition at time of admission) Patient's cognitive ability adequate to safely complete daily activities?: Yes Is the patient deaf or have difficulty hearing?: No Does the patient have difficulty seeing, even  when wearing glasses/contacts?: No Does the patient have difficulty concentrating, remembering, or making decisions?: No Does the patient have difficulty dressing or bathing?: No Independently performs ADLs?: Yes (appropriate for developmental age) Does the patient have difficulty walking or climbing stairs?: No Weakness of Legs: None Weakness of Arms/Hands: None  Home Assistive Devices/Equipment Home Assistive  Devices/Equipment: None  Therapy Consults (therapy consults require a physician order) PT Evaluation Needed: No OT Evalulation Needed: No SLP Evaluation Needed: No Abuse/Neglect Assessment (Assessment to be complete while patient is alone) Physical Abuse: Denies Verbal Abuse: Denies Sexual Abuse: Denies Exploitation of patient/patient's resources: Denies Self-Neglect: Denies Values / Beliefs Cultural Requests During Hospitalization: None Spiritual Requests During Hospitalization: None Consults Spiritual Care Consult Needed: No Social Work Consult Needed: No      Additional Information 1:1 In Past 12 Months?: No CIRT Risk: No Elopement Risk: No Does patient have medical clearance?: Yes     Disposition:  Disposition Initial Assessment Completed for this Encounter: Yes (Consulted with Catalina Pizza, DNP) Disposition of Patient: Other dispositions Other disposition(s): To current provider [facility] and Beverly Sessions)  [place] MSW, [facility]  07/17/2016 5:57 PM

## 2016-07-17 NOTE — ED Provider Notes (Signed)
[place] DEPT Provider Note   CSN: [id number] Arrival date & time: 07/17/16  1405     History   Chief Complaint Chief Complaint  Patient presents with  . Abdominal Pain    HPI [name] is a 46 y.o. male.  Patient w hx chronic abdominal pain, etoh related pancreatitis, c/o epigastric pain, c/w his prior pain.  Pain present for past day, constant, moderate, non radiating. No vomiting or diarrhea. Nausea. No fever or chills. No faintness or dizziness. No chest pain or sob. No back/flank pain. No dysuria or gu c/o.  Denies recent etoh use.       The history is provided by the patient.  Abdominal Pain   Pertinent negatives include fever, diarrhea, vomiting, constipation, dysuria and headaches.    Past Medical History:  Diagnosis Date  . Alcoholism /alcohol abuse ([place])   . Anemia   . Anxiety   . Arthritis    "knees; arms; elbows" (03/26/2015)  . Asthma   . Bipolar disorder ([place])   . Chronic bronchitis ([place])   . Chronic lower back pain   . Cocaine abuse   . Depression   . Family history of adverse reaction to anesthesia    "grandmother gets confused"  . Femoral condyle fracture ([place]) 03/08/2014   left medial/notes 03/09/2014  . GERD (gastroesophageal reflux disease)   . H/O hiatal hernia   . H/O suicide attempt 10/2012  . Heart murmur    "when he was little" (03/06/2013)  . High cholesterol   . History of blood transfusion 10/2012   "when I tried to commit suicide"  . History of stomach ulcers   . Hypertension   . Marijuana abuse, continuous   . Migraine    "a few times/year" (03/26/2015)  . Pancreatitis   . Pneumonia 1990's X 3  . PTSD (post-traumatic stress disorder)   . Shortness of breath    "can happen at anytime" (03/06/2013)  . Sickle cell trait ([place])   . WPW (Wolff-Parkinson-White syndrome)    [name] 03/06/2013    Patient Active Problem List   Diagnosis Date Noted  . Leukocytosis   . Hospital acquired PNA 05/20/2015  . Pancreatitis  05/18/2015  . Pseudocyst of pancreas 05/18/2015  . Malnutrition of moderate degree 03/27/2015  . Abnormal transaminases 03/26/2015  . Polysubstance abuse (tobacco, cocaine, THC, and ETOH) 03/26/2015  . Acute pancreatitis 03/26/2015  . Alcohol-induced chronic pancreatitis ([place])   . Essential hypertension 02/06/2014  . Alcohol-induced acute pancreatitis 11/28/2013  . Pancreatic pseudocyst/cyst 11/25/2013  . Alcohol dependence ([place]) 10/23/2013  . MDD (major depressive disorder), recurrent severe, without psychosis ([place]) 10/22/2013  . Protein-calorie malnutrition, severe ([place]) 10/10/2013  . Suicide attempt 10/08/2013  . Overdose 10/06/2013  . 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 TOBACCO ABUSE 03/23/2007    Past Surgical History:  Procedure Laterality Date  . CARDIAC CATHETERIZATION    . EYE SURGERY Left 1990's   "result of trauma"   . FACIAL FRACTURE SURGERY Left 1990's   "result of trauma"   . FRACTURE SURGERY    . HERNIA REPAIR    . LEFT HEART CATHETERIZATION WITH CORONARY ANGIOGRAM Right 03/07/2013   Procedure: LEFT HEART CATHETERIZATION WITH CORONARY ANGIOGRAM;  Surgeon: [name], MD;  Location: [place] CATH LAB;  Service: Cardiovascular;  Laterality: Right;  . UMBILICAL HERNIA REPAIR         Home Medications    Prior to Admission medications   Medication Sig Start Date End  Date Taking? Authorizing Provider  albuterol (PROVENTIL HFA;VENTOLIN HFA) 108 (90 BASE) MCG/ACT inhaler Inhale 2 puffs into the lungs every 6 (six) hours as needed for wheezing or shortness of breath (wheezing).     Historical Provider, MD  Calcium Carbonate Antacid (MAALOX) 600 MG chewable tablet Chew 1 tablet (600 mg total) by mouth 2 (two) times daily. Patient taking differently: Chew 600 mg by mouth 2 (two) times daily as needed for heartburn.  06/15/16   Nicole Pisciotta, PA-C  cholestyramine (QUESTRAN) 4 g packet Take 1 packet (4 g total) by mouth 3 (three) times  daily as needed (diarrhea). 54/6/50   Delora Fuel, MD  clindamycin (CLEOCIN) 300 MG capsule Take 1 capsule (300 mg total) by mouth 3 (three) times daily. Patient not taking: Reported on 04/24/2016 02/21/16   Kristen N Ward, DO  diphenoxylate-atropine (LOMOTIL) 2.5-0.025 MG tablet Take 1 tablet by mouth 4 (four) times daily as needed for diarrhea or loose stools. Patient not taking: Reported on 04/24/2016 12/04/15   [name], PA-C  fluticasone (FLONASE) 50 MCG/ACT nasal spray Place 1 spray into both nostrils daily as needed for allergies.     Historical Provider, MD  lipase/protease/amylase (CREON) 12000 UNITS CPEP capsule Take 2 capsules (24,000 Units total) by mouth 3 (three) times daily with meals. 01/15/15   [name], MD  metoprolol tartrate (LOPRESSOR) 25 MG tablet Take 1 tablet (25 mg total) by mouth 2 (two) times daily. 12/14/15   Tiffany Daneil Dan, PA-C  omeprazole (PRILOSEC) 20 MG capsule Take 1 capsule (20 mg total) by mouth daily. 35/4/65   Delora Fuel, MD  ondansetron (ZOFRAN ODT) 4 MG disintegrating tablet Take 1 tablet (4 mg total) by mouth every 8 (eight) hours as needed for nausea or vomiting. Patient not taking: Reported on 04/24/2016 04/17/16   Deno Etienne, DO  ondansetron (ZOFRAN ODT) 8 MG disintegrating tablet Take 1 tablet (8 mg total) by mouth every 8 (eight) hours as needed for nausea or vomiting. Patient not taking: Reported on 07/16/2016 01/25/16   [name], MD  ondansetron [facility]) 4 MG tablet Take 1 tablet (4 mg total) by mouth every 8 (eight) hours as needed for nausea or vomiting. 07/16/16   Gwenyth Allegra Tegeler, MD  oxyCODONE-acetaminophen (PERCOCET/ROXICET) 5-325 MG tablet Take 1-2 tablets by mouth every 6 (six) hours as needed for severe pain. Patient not taking: Reported on 07/16/2016 06/12/16   Lajean Saver, MD  pantoprazole (PROTONIX) 20 MG tablet Take 2 tablets (40 mg total) by mouth daily. Patient not taking: Reported on 04/24/2016 04/11/16   Merryl Hacker, MD  sertraline (ZOLOFT) 25 MG tablet Take 75 mg by mouth daily.    Historical Provider, MD  sildenafil (VIAGRA) 50 MG tablet Take 1 tablet (50 mg total) by mouth daily as needed for erectile dysfunction. 12/14/15   Brayton Caves, PA-C  sucralfate (CARAFATE) 1 g tablet Take 1 tablet (1 g total) by mouth 4 (four) times daily -  with meals and at bedtime. 11/15/15   [place]ark Lions, PA-C    Family History Family History  Problem Relation Age of Onset  . Hypertension Other   . Coronary artery disease Other     Social History Social History  Substance Use Topics  . Smoking status: Current Every Day Smoker    Packs/day: 1.00    Years: 30.00    Types: Cigarettes  . Smokeless tobacco: Current User    Types: Chew  . Alcohol use 7.2 oz/week  12 Cans of beer per week     Allergies   Shellfish-derived products; Trazodone and nefazodone; and Reglan [metoclopramide]   Review of Systems Review of Systems  Constitutional: Negative for fever.  HENT: Negative for sore throat.   Eyes: Negative for redness.  Respiratory: Negative for shortness of breath.   Cardiovascular: Negative for chest pain.  Gastrointestinal: Positive for abdominal pain. Negative for abdominal distention, constipation, diarrhea and vomiting.  Genitourinary: Negative for dysuria and flank pain.  Musculoskeletal: Negative for back pain and neck pain.  Skin: Negative for rash.  Neurological: Negative for headaches.  Hematological: Does not bruise/bleed easily.  Psychiatric/Behavioral: Negative for confusion.     Physical Exam Updated Vital Signs BP 145/92 (BP Location: Right Arm)   Pulse 87   Temp 98.5 F (36.9 C) (Oral)   Resp 18   SpO2 100%   Physical Exam  Constitutional: He appears well-developed and well-nourished. No distress.  HENT:  Mouth/Throat: Oropharynx is clear and moist.  Eyes: Conjunctivae are normal.  Neck: Neck supple. No tracheal deviation present.  Cardiovascular: Normal  rate, regular rhythm, normal heart sounds and intact distal pulses.  Exam reveals no gallop and no friction rub.   No murmur heard. Pulmonary/Chest: Effort normal and breath sounds normal. No accessory muscle usage. No respiratory distress.  Abdominal: Soft. Bowel sounds are normal. He exhibits no distension and no mass. There is no tenderness. There is no rebound and no guarding. No hernia.  Genitourinary:  Genitourinary Comments: No cva tenderness  Musculoskeletal: He exhibits no edema.  Neurological: He is alert.  Skin: Skin is warm and dry. No rash noted. He is not diaphoretic.  Psychiatric: He has a normal mood and affect.  Nursing note and vitals reviewed.    ED Treatments / Results  Labs (all labs ordered are listed, but only abnormal results are displayed) Labs Reviewed - No data to display  EKG  EKG Interpretation None       Radiology Ct Abdomen Pelvis W Contrast  Result Date: 07/16/2016 CLINICAL DATA:  Abdominal pain. Reason blunt trauma to the epigastric region. The pain feels similar to previous pancreatitis flares. EXAM: CT ABDOMEN AND PELVIS WITH CONTRAST TECHNIQUE: Multidetector CT imaging of the abdomen and pelvis was performed using the standard protocol following bolus administration of intravenous contrast. CONTRAST:  132mL ISOVUE-300 IOPAMIDOL (ISOVUE-300) INJECTION 61% COMPARISON:  05/18/2015 FINDINGS: Lower chest: Normal Hepatobiliary: No focal liver abnormality is seen. No gallstones, gallbladder wall thickening, or biliary dilatation. Pancreas: Calcifications typical of chronic pancreatitis. Multiple pseudocysts, measuring from a few mm up to 2.1 cm. Variable interval changes in these pseudo cysts. The largest is in the pancreatic body to the left of midline, measuring 2.1 cm and previously measuring 0.9 cm. On the prior study the largest pseudocyst was in the pancreatic head measuring 2.2 cm; this cyst now measures 1.2 cm. There is mild hazy attenuation in the  peripancreatic fat around the head and uncinate portions. This might represent a degree of acute pancreatitis superimposed on chronic but it is inconclusive. Spleen: Normal in size without focal abnormality. Adrenals/Urinary Tract: Adrenal glands are unremarkable. Kidneys are normal, without renal calculi, focal lesion, or hydronephrosis. Bladder is unremarkable. Stomach/Bowel: Stomach is within normal limits. Appendix is normal. No evidence of bowel wall thickening, distention, or inflammatory changes. Vascular/Lymphatic: The abdominal aorta is normal in caliber with moderate atherosclerotic calcification. No pathologic adenopathy in the abdomen or pelvis. Reproductive: Unremarkable Other: No ascites Musculoskeletal: No significant skeletal lesion IMPRESSION: 1. Chronic  pancreatitis. 2. Multiple pseudocysts, the largest measuring 2.1 cm. 3. Equivocal mild acute pancreatitis around the pancreatic head. Electronically Signed   By: Andreas Newport M.D.   On: 07/16/2016 03:51    Procedures Procedures (including critical care time)  Medications Ordered in ED Medications  famotidine (PEPCID) tablet 20 mg (20 mg Oral Given 07/17/16 1505)  alum & mag hydroxide-simeth (MAALOX/MYLANTA) 200-200-20 MG/5ML suspension 30 mL (30 mLs Oral Given 07/17/16 1505)  morphine 4 MG/ML injection 4 mg (4 mg Intravenous Given 07/17/16 1505)  ondansetron (ZOFRAN) injection 4 mg (4 mg Intravenous Given 07/17/16 1505)     Initial Impression / Assessment and Plan / ED Course  I have reviewed the triage vital signs and the nursing notes.  Pertinent labs & imaging results that were available during my care of the patient were reviewed by me and considered in my medical decision making (see chart for details).  Morphine and zofran.  pepcid and maalox given for symptom relief.  abd soft nt. No emesis. Pt w very recent set of labs and ct  - negative for acute process.  Pt tolerating po.  Pt currently appears stable for d/c.      Final Clinical Impressions(s) / ED Diagnoses   Final diagnoses:  None    New Prescriptions New Prescriptions   No medications on file     Lajean Saver, MD 07/17/16 1510

## 2016-07-17 NOTE — Discharge Instructions (Signed)
It was our pleasure to provide your ER care today - we hope that you feel better.  Continue prilosec.  You may also try pepcid and maalox for symptom relief.  Avoid any alcohol use.  Follow up with AA, and use resource guide provided.  Also follow up with primary care doctor in the next 1-2 weeks.  Return to ER if worse, new symptoms, fevers, persistent vomiting, other concern.  You were given pain medication in the ER - no driving for the next 6 hours.

## 2016-07-19 ENCOUNTER — Encounter (HOSPITAL_COMMUNITY): Payer: Self-pay | Admitting: Emergency Medicine

## 2016-07-19 ENCOUNTER — Ambulatory Visit (HOSPITAL_COMMUNITY)
Admission: RE | Admit: 2016-07-19 | Discharge: 2016-07-19 | Disposition: A | Discharge status: Home / Self Care | Payer: No Typology Code available for payment source | Attending: Psychiatry | Admitting: Psychiatry

## 2016-07-19 ENCOUNTER — Emergency Department (HOSPITAL_COMMUNITY)
Admission: EM | Admit: 2016-07-19 | Discharge: 2016-07-20 | Disposition: A | Discharge status: Other Acute Inpatient Hospital | Payer: Federal, State, Local not specified - Other | Attending: Emergency Medicine | Admitting: Emergency Medicine

## 2016-07-19 DIAGNOSIS — F1010 Alcohol abuse, uncomplicated: Secondary | ICD-10-CM | POA: Insufficient documentation

## 2016-07-19 DIAGNOSIS — D649 Anemia, unspecified: Secondary | ICD-10-CM | POA: Insufficient documentation

## 2016-07-19 DIAGNOSIS — F332 Major depressive disorder, recurrent severe without psychotic features: Secondary | ICD-10-CM | POA: Diagnosis present

## 2016-07-19 DIAGNOSIS — F319 Bipolar disorder, unspecified: Secondary | ICD-10-CM | POA: Insufficient documentation

## 2016-07-19 DIAGNOSIS — Z888 Allergy status to other drugs, medicaments and biological substances status: Secondary | ICD-10-CM | POA: Insufficient documentation

## 2016-07-19 DIAGNOSIS — G8929 Other chronic pain: Secondary | ICD-10-CM | POA: Insufficient documentation

## 2016-07-19 DIAGNOSIS — F1910 Other psychoactive substance abuse, uncomplicated: Secondary | ICD-10-CM | POA: Diagnosis present

## 2016-07-19 DIAGNOSIS — I10 Essential (primary) hypertension: Secondary | ICD-10-CM | POA: Insufficient documentation

## 2016-07-19 DIAGNOSIS — F99 Mental disorder, not otherwise specified: Secondary | ICD-10-CM | POA: Insufficient documentation

## 2016-07-19 DIAGNOSIS — F17210 Nicotine dependence, cigarettes, uncomplicated: Secondary | ICD-10-CM | POA: Insufficient documentation

## 2016-07-19 DIAGNOSIS — D573 Sickle-cell trait: Secondary | ICD-10-CM | POA: Insufficient documentation

## 2016-07-19 DIAGNOSIS — Z91013 Allergy to seafood: Secondary | ICD-10-CM | POA: Insufficient documentation

## 2016-07-19 DIAGNOSIS — K219 Gastro-esophageal reflux disease without esophagitis: Secondary | ICD-10-CM | POA: Insufficient documentation

## 2016-07-19 DIAGNOSIS — E785 Hyperlipidemia, unspecified: Secondary | ICD-10-CM | POA: Insufficient documentation

## 2016-07-19 DIAGNOSIS — R109 Unspecified abdominal pain: Secondary | ICD-10-CM | POA: Insufficient documentation

## 2016-07-19 DIAGNOSIS — F1020 Alcohol dependence, uncomplicated: Secondary | ICD-10-CM | POA: Diagnosis present

## 2016-07-19 DIAGNOSIS — F1290 Cannabis use, unspecified, uncomplicated: Secondary | ICD-10-CM | POA: Insufficient documentation

## 2016-07-19 LAB — SALICYLATE LEVEL: Salicylate Lvl: <7 mg/dL (ref 2.8–30.0)

## 2016-07-19 LAB — COMPREHENSIVE METABOLIC PANEL
ALBUMIN: 4.5 g/dL (ref 3.5–5.0)
ALK PHOS: 92 U/L (ref 38–126)
ALT: 40 U/L (ref 17–63)
ANION GAP: 8 (ref 5–15)
AST: 46 U/L — AB (ref 15–41)
BUN: 6 mg/dL (ref 6–20)
CO2: 26 mmol/L (ref 22–32)
Calcium: 9 mg/dL (ref 8.9–10.3)
Chloride: 108 mmol/L (ref 101–111)
Creatinine, Ser: 0.66 mg/dL (ref 0.61–1.24)
GFR calc Af Amer: >60 mL/min (ref >60)
GFR calc non Af Amer: >60 mL/min (ref >60)
GLUCOSE: 61 mg/dL — AB (ref 65–99)
POTASSIUM: 3.4 mmol/L — AB (ref 3.5–5.1)
SODIUM: 142 mmol/L (ref 135–145)
Total Bilirubin: 0.7 mg/dL (ref 0.3–1.2)
Total Protein: 8.3 g/dL — ABNORMAL HIGH (ref 6.5–8.1)

## 2016-07-19 LAB — CBC
HEMATOCRIT: 33.5 % — AB (ref 39.0–52.0)
HEMOGLOBIN: 11 g/dL — AB (ref 13.0–17.0)
MCH: 28.9 pg (ref 26.0–34.0)
MCHC: 32.8 g/dL (ref 30.0–36.0)
MCV: 88.2 fL (ref 78.0–100.0)
Platelets: 274 10*3/uL (ref 150–400)
RBC: 3.8 MIL/uL — ABNORMAL LOW (ref 4.22–5.81)
RDW: 15 % (ref 11.5–15.5)
WBC: 7.8 10*3/uL (ref 4.0–10.5)

## 2016-07-19 LAB — ETHANOL: Alcohol, Ethyl (B): 98 mg/dL — ABNORMAL HIGH (ref <5)

## 2016-07-19 LAB — RAPID URINE DRUG SCREEN, HOSP PERFORMED
AMPHETAMINES: NOT DETECTED
BARBITURATES: NOT DETECTED
BENZODIAZEPINES: NOT DETECTED
COCAINE: POSITIVE — AB
Opiates: NOT DETECTED
TETRAHYDROCANNABINOL: NOT DETECTED

## 2016-07-19 LAB — ACETAMINOPHEN LEVEL

## 2016-07-19 MED ORDER — GI COCKTAIL ~~LOC~~
30.0000 mL | Freq: Once | ORAL | Status: AC
  Administered 2016-07-19: 30 mL via ORAL
  Filled 2016-07-19 (×2): qty 30

## 2016-07-19 MED ORDER — ACETAMINOPHEN 325 MG PO TABS
650.0000 mg | ORAL_TABLET | Freq: Four times a day (QID) | ORAL | Status: DC | PRN
  Administered 2016-07-19: 650 mg via ORAL
  Filled 2016-07-19: qty 2

## 2016-07-19 MED ORDER — PANTOPRAZOLE SODIUM 40 MG PO TBEC
40.0000 mg | DELAYED_RELEASE_TABLET | Freq: Two times a day (BID) | ORAL | Status: DC
  Administered 2016-07-19 – 2016-07-20 (×2): 40 mg via ORAL
  Filled 2016-07-19 (×2): qty 1

## 2016-07-19 MED ORDER — SUCRALFATE 1 G PO TABS
1.0000 g | ORAL_TABLET | Freq: Three times a day (TID) | ORAL | Status: DC
  Filled 2016-07-19: qty 1

## 2016-07-19 MED ORDER — FAMOTIDINE 20 MG PO TABS
20.0000 mg | ORAL_TABLET | Freq: Two times a day (BID) | ORAL | Status: DC
  Administered 2016-07-19 – 2016-07-20 (×3): 20 mg via ORAL
  Filled 2016-07-19 (×3): qty 1

## 2016-07-19 MED ORDER — PANCRELIPASE (LIP-PROT-AMYL) 12000-38000 UNITS PO CPEP
24000.0000 [IU] | ORAL_CAPSULE | Freq: Three times a day (TID) | ORAL | Status: DC
  Administered 2016-07-19 – 2016-07-20 (×2): 24000 [IU] via ORAL
  Filled 2016-07-19 (×4): qty 2

## 2016-07-19 MED ORDER — HYDROXYZINE HCL 25 MG PO TABS
25.0000 mg | ORAL_TABLET | Freq: Four times a day (QID) | ORAL | Status: DC | PRN
  Administered 2016-07-19 – 2016-07-20 (×3): 25 mg via ORAL
  Filled 2016-07-19 (×3): qty 1

## 2016-07-19 MED ORDER — SUCRALFATE 1 G PO TABS
1.0000 g | ORAL_TABLET | Freq: Three times a day (TID) | ORAL | Status: DC
  Administered 2016-07-19 – 2016-07-20 (×3): 1 g via ORAL
  Filled 2016-07-19 (×6): qty 1

## 2016-07-19 NOTE — ED Provider Notes (Signed)
[name] DEPT Provider Note   CSN: [id number] Arrival date & time: 07/19/16  1423     History   Chief Complaint No chief complaint on file.   HPI [name] is a 46 y.o. male.  The history is provided by the patient.  Mental Health Problem  Presenting symptoms: depression and suicidal thoughts   Degree of incapacity (severity):  Moderate Onset quality:  Gradual Duration:  1 day Timing:  Constant Progression:  Unchanged Chronicity:  Chronic Context: alcohol use and drug abuse   Relieved by:  Nothing Worsened by:  Nothing Ineffective treatments:  None tried Associated symptoms: abdominal pain (chronic)   Risk factors: hx of mental illness     Past Medical History:  Diagnosis Date  . Alcoholism /alcohol abuse ([place])   . Anemia   . Anxiety   . Arthritis    "knees; arms; elbows" (03/26/2015)  . Asthma   . Bipolar disorder ([place])   . Chronic bronchitis ([place])   . Chronic lower back pain   . Cocaine abuse   . Depression   . Family history of adverse reaction to anesthesia    "grandmother gets confused"  . Femoral condyle fracture ([place]) 03/08/2014   left medial/notes 03/09/2014  . GERD (gastroesophageal reflux disease)   . H/O hiatal hernia   . H/O suicide attempt 10/2012  . Heart murmur    "when he was little" (03/06/2013)  . High cholesterol   . History of blood transfusion 10/2012   "when I tried to commit suicide"  . History of stomach ulcers   . Hypertension   . Marijuana abuse, continuous   . Migraine    "a few times/year" (03/26/2015)  . Pancreatitis   . Pneumonia 1990's X 3  . PTSD (post-traumatic stress disorder)   . Shortness of breath    "can happen at anytime" (03/06/2013)  . Sickle cell trait ([place])   . WPW (Wolff-Parkinson-White syndrome)    [name] 03/06/2013    Patient Active Problem List   Diagnosis Date Noted  . Leukocytosis   . Hospital acquired PNA 05/20/2015  . Pancreatitis 05/18/2015  . Pseudocyst of pancreas  05/18/2015  . Malnutrition of moderate degree 03/27/2015  . Abnormal transaminases 03/26/2015  . Polysubstance abuse (tobacco, cocaine, THC, and ETOH) 03/26/2015  . Acute pancreatitis 03/26/2015  . Alcohol-induced chronic pancreatitis ([place])   . Essential hypertension 02/06/2014  . Alcohol-induced acute pancreatitis 11/28/2013  . Pancreatic pseudocyst/cyst 11/25/2013  . Alcohol dependence ([place]) 10/23/2013  . MDD (major depressive disorder), recurrent severe, without psychosis ([place]) 10/22/2013  . Protein-calorie malnutrition, severe ([place]) 10/10/2013  . Suicide attempt 10/08/2013  . Overdose 10/06/2013  . 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 TOBACCO ABUSE 03/23/2007    Past Surgical History:  Procedure Laterality Date  . CARDIAC CATHETERIZATION    . EYE SURGERY Left 1990's   "result of trauma"   . FACIAL FRACTURE SURGERY Left 1990's   "result of trauma"   . FRACTURE SURGERY    . HERNIA REPAIR    . LEFT HEART CATHETERIZATION WITH CORONARY ANGIOGRAM Right 03/07/2013   Procedure: LEFT HEART CATHETERIZATION WITH CORONARY ANGIOGRAM;  Surgeon: [name], MD;  Location: [facility];  Service: Cardiovascular;  Laterality: Right;  . UMBILICAL HERNIA REPAIR         Home Medications    Prior to Admission medications   Medication Sig Start Date End Date Taking? Authorizing Provider  albuterol (PROVENTIL HFA;VENTOLIN HFA) 108 (90 BASE) MCG/ACT  inhaler Inhale 2 puffs into the lungs every 6 (six) hours as needed for wheezing or shortness of breath (wheezing).     Historical Provider, MD  Calcium Carbonate Antacid (MAALOX) 600 MG chewable tablet Chew 1 tablet (600 mg total) by mouth 2 (two) times daily. Patient taking differently: Chew 600 mg by mouth 2 (two) times daily as needed for heartburn.  06/15/16   Nicole Pisciotta, PA-C  cholestyramine (QUESTRAN) 4 g packet Take 1 packet (4 g total) by mouth 3 (three) times daily as needed (diarrhea). 74/1/63    Delora Fuel, MD  clindamycin (CLEOCIN) 300 MG capsule Take 1 capsule (300 mg total) by mouth 3 (three) times daily. Patient not taking: Reported on 04/24/2016 02/21/16   Kristen N Ward, DO  diphenoxylate-atropine (LOMOTIL) 2.5-0.025 MG tablet Take 1 tablet by mouth 4 (four) times daily as needed for diarrhea or loose stools. Patient not taking: Reported on 04/24/2016 12/04/15   [name], PA-C  fluticasone (FLONASE) 50 MCG/ACT nasal spray Place 1 spray into both nostrils daily as needed for allergies.     Historical Provider, MD  lipase/protease/amylase (CREON) 12000 UNITS CPEP capsule Take 2 capsules (24,000 Units total) by mouth 3 (three) times daily with meals. 01/15/15   [name], MD  metoprolol tartrate (LOPRESSOR) 25 MG tablet Take 1 tablet (25 mg total) by mouth 2 (two) times daily. 12/14/15   Tiffany Daneil Dan, PA-C  omeprazole (PRILOSEC) 20 MG capsule Take 1 capsule (20 mg total) by mouth daily. 84/5/36   Delora Fuel, MD  ondansetron (ZOFRAN ODT) 4 MG disintegrating tablet Take 1 tablet (4 mg total) by mouth every 8 (eight) hours as needed for nausea or vomiting. Patient not taking: Reported on 04/24/2016 04/17/16   Deno Etienne, DO  ondansetron (ZOFRAN ODT) 8 MG disintegrating tablet Take 1 tablet (8 mg total) by mouth every 8 (eight) hours as needed for nausea or vomiting. Patient not taking: Reported on 07/16/2016 01/25/16   [name], MD  ondansetron [facility]) 4 MG tablet Take 1 tablet (4 mg total) by mouth every 8 (eight) hours as needed for nausea or vomiting. 07/16/16   Gwenyth Allegra Tegeler, MD  oxyCODONE-acetaminophen (PERCOCET/ROXICET) 5-325 MG tablet Take 1-2 tablets by mouth every 6 (six) hours as needed for severe pain. Patient not taking: Reported on 07/16/2016 06/12/16   Lajean Saver, MD  pantoprazole (PROTONIX) 20 MG tablet Take 2 tablets (40 mg total) by mouth daily. Patient not taking: Reported on 04/24/2016 04/11/16   Merryl Hacker, MD  sertraline (ZOLOFT) 25 MG  tablet Take 75 mg by mouth daily.    Historical Provider, MD  sildenafil (VIAGRA) 50 MG tablet Take 1 tablet (50 mg total) by mouth daily as needed for erectile dysfunction. 12/14/15   Brayton Caves, PA-C  sucralfate (CARAFATE) 1 g tablet Take 1 tablet (1 g total) by mouth 4 (four) times daily -  with meals and at bedtime. 11/15/15   Baileyton Lions, PA-C    Family History Family History  Problem Relation Age of Onset  . Hypertension Other   . Coronary artery disease Other     Social History Social History  Substance Use Topics  . Smoking status: Current Every Day Smoker    Packs/day: 1.00    Years: 30.00    Types: Cigarettes  . Smokeless tobacco: Current User    Types: Chew  . Alcohol use 7.2 oz/week    12 Cans of beer per week     Allergies  Shellfish-derived products; Trazodone and nefazodone; and Reglan [metoclopramide]   Review of Systems Review of Systems  Gastrointestinal: Positive for abdominal pain (chronic).  Psychiatric/Behavioral: Positive for suicidal ideas.  All other systems reviewed and are negative.    Physical Exam Updated Vital Signs BP 118/76 (BP Location: Left Arm)   Pulse 87   Temp 98.7 F (37.1 C) (Oral)   Resp 18   Ht 5\' 9"  (1.753 m)   Wt 149 lb (67.6 kg)   SpO2 100%   BMI 22.00 kg/m   Physical Exam  Constitutional: He is oriented to person, place, and time. He appears well-developed and well-nourished. No distress.  HENT:  Head: Normocephalic and atraumatic.  Nose: Nose normal.  Mouth/Throat: Oropharynx is clear and moist.  Eyes: Conjunctivae are normal.  Neck: Neck supple. No tracheal deviation present.  Cardiovascular: Normal rate, regular rhythm and normal heart sounds.   Pulmonary/Chest: Effort normal and breath sounds normal. No respiratory distress.  Abdominal: Soft. He exhibits no distension. There is no tenderness. There is no rebound and no guarding.  Neurological: He is alert and oriented to person, place, and time.    Skin: Skin is warm and dry.  Psychiatric: He has a normal mood and affect.  Vitals reviewed.    ED Treatments / Results  Labs (all labs ordered are listed, but only abnormal results are displayed) Labs Reviewed  COMPREHENSIVE METABOLIC PANEL  ETHANOL  SALICYLATE LEVEL  ACETAMINOPHEN LEVEL  CBC  RAPID URINE DRUG SCREEN, HOSP PERFORMED    EKG  EKG Interpretation None       Radiology No results found.  Procedures Procedures (including critical care time)  Medications Ordered in ED Medications  famotidine (PEPCID) tablet 20 mg (not administered)  gi cocktail (Maalox,Lidocaine,Donnatal) (not administered)  acetaminophen (TYLENOL) tablet 650 mg (not administered)     Initial Impression / Assessment and Plan / ED Course  I have reviewed the triage vital signs and the nursing notes.  Pertinent labs & imaging results that were available during my care of the patient were reviewed by me and considered in my medical decision making (see chart for details).     46 year old male with history of bipolar presents from behavioral health Hospital for medical clearance as he told them he has suicidal ideation and has qualified for inpatient treatment. He has chronic abdominal pain which is unchanged from prior. Labs are being performed but I suspect they will be normal. MEDICALLY CLEAR FOR TRANSFER OR PSYCHIATRIC ADMISSION.   Final Clinical Impressions(s) / ED Diagnoses   Final diagnoses:  Chronic abdominal pain  Psychiatric disturbance    New Prescriptions New Prescriptions   No medications on file     Leo Grosser, MD 07/19/16 1511

## 2016-07-19 NOTE — ED Notes (Signed)
Pt admitted to room #43. Pt irritable. Pt denies SI/HI/AVH. Pt reports SA in 2015 by OD. Pt reports he has been compliant with his medication regimen. Encouragement and support provided. Special checks q 15 mins in place for safety. Video monitoring in place. Will continue to monitor.

## 2016-07-19 NOTE — ED Notes (Signed)
Pt requesting home medication that he takes prior to eating dinner- PTA medication not ordered at this time, this nurse notified EDP.

## 2016-07-19 NOTE — ED Notes (Signed)
Bed: [facility] Expected date:  Expected time:  Means of arrival:  Comments: Hold for 27

## 2016-07-19 NOTE — ED Notes (Signed)
Patient states he is no SI or HI

## 2016-07-19 NOTE — H&P (Signed)
Behavioral Health Medical Screening Exam  [name] is an 46 y.o. male, African-American. Patient walked-in with a family member with complaints of suicidal ideations without plans or intent. He report, "I'm here for mental health, also, I have been drinking. My stomach hurts because I have pancreatitis. I went to the ED the other day, they gave me Morphine, then a dose of Fentanyl. It did not help the pain". Patient currently presents as if he is responding to some internal stimuli. Will send to the ED for medical clearance at this time.   Total Time spent with patient: 45 minutes  Psychiatric Specialty Exam: Physical Exam  Constitutional: He appears well-developed.  HENT:  Head: Normocephalic.  Eyes: Pupils are equal, round, and reactive to light.  Cardiovascular:  Hx. HTN  Respiratory: Effort normal.  GI: Soft.  Genitourinary:  Genitourinary Comments: Deferred  Musculoskeletal: Normal range of motion.  Neurological: He is alert.  Skin: Skin is warm.    Review of Systems  Constitutional: Positive for malaise/fatigue.  HENT: Negative.   Eyes: Negative.   Respiratory: Negative.   Cardiovascular: Negative.   Gastrointestinal: Negative.   Genitourinary: Negative.   Musculoskeletal: Negative.   Skin: Negative.   Neurological: Negative.   Endo/Heme/Allergies: Negative.   Psychiatric/Behavioral: Positive for depression. Negative for hallucinations, memory loss and substance abuse. The patient is nervous/anxious and has insomnia.     There were no vitals taken for this visit.There is no height or weight on file to calculate BMI.  General Appearance: Casual, restless.  Eye Contact:  Poor  Speech:  Clear and Coherent and Normal Rate  Volume:  Normal  Mood:  Anxious and Depressed  Affect:  Constricted  Thought Process:  Coherent and Descriptions of Associations: Intact  Orientation:  Full (Time, Place, and Person)  Thought Content:  Patient presents as psychotic,  responding to internal stimuli.  Suicidal Thoughts:  No  Homicidal Thoughts:  No  Memory:  Immediate;   Fair Recent;   Fair Remote;   Fair  Judgement:  Impaired  Insight:  Fair  Psychomotor Activity:  Restlessness  Concentration: Concentration: Poor and Attention Span: Poor  Recall:  [employer] of Knowledge:Poor  Language: Fair  Akathisia:  No  Handed:  Right  AIMS (if indicated):     Assets:  Desire for Improvement Social Support  Sleep:      Musculoskeletal: Strength & Muscle Tone: within normal limits Gait & Station: normal Patient leans: Right  Vital signs: 98.5, 83, 18, 120/88. Os Sat: 100%  Recommendations: Based on my evaluation the patient appears to have an emergency medical condition for which I recommend the patient be transferred to the emergency department for further evaluation (medical clearance).   Encarnacion Slates, NP, PMHNP, FNP-BC 07/19/2016, 2:41 PM

## 2016-07-19 NOTE — BH Assessment (Signed)
Tele Assessment Note   [name] is an 46 y.o. male presenting to [facility] with thoughts of suicide, no plan or intent. Passive thoughts that life is not worth living. States he has little to lose in life. The patient has not been seen by [facility] for mental health services in quite some time.  Reports hearing auditory hallucinations and sensing their presence of others around him. During a moment of agitation expressed delusional  thinking about the president and made a derogatory comment about Mexicans. Today, the patient became angry when he perceived being passed over in the lobby waiting area. The patient's girlfriend who was with him during the interview reports outburst on a regular basis lately and finds him difficult to manage.   The patient appeared depressed in mood, had fair eye contact, seemed easily agitated, expressed poor self esteem, oriented x4. Patient reports daily drinking, a 12 pk a day, as well as, cannabis uses several times a week. Stressor involved financial issues and lack of work.   Catalina Pizza, DNP recommends inpatient treatment  Diagnosis: Bipolar disorder with psychotic features; Alcohol use disorder; Cannabis use disorder  Past Medical History:  Past Medical History:  Diagnosis Date  . Alcoholism /alcohol abuse ([place])   . Anemia   . Anxiety   . Arthritis    "knees; arms; elbows" (03/26/2015)  . Asthma   . Bipolar disorder ([place])   . Chronic bronchitis ([place])   . Chronic lower back pain   . Cocaine abuse   . Depression   . Family history of adverse reaction to anesthesia    "grandmother gets confused"  . Femoral condyle fracture ([place]) 03/08/2014   left medial/notes 03/09/2014  . GERD (gastroesophageal reflux disease)   . H/O hiatal hernia   . H/O suicide attempt 10/2012  . Heart murmur    "when he was little" (03/06/2013)  . High cholesterol   . History of blood transfusion 10/2012   "when I tried to commit suicide"  . History of stomach ulcers    . Hypertension   . Marijuana abuse, continuous   . Migraine    "a few times/year" (03/26/2015)  . Pancreatitis   . Pneumonia 1990's X 3  . PTSD (post-traumatic stress disorder)   . Shortness of breath    "can happen at anytime" (03/06/2013)  . Sickle cell trait ([place])   . WPW (Wolff-Parkinson-White syndrome)    [name] 03/06/2013    Past Surgical History:  Procedure Laterality Date  . CARDIAC CATHETERIZATION    . EYE SURGERY Left 1990's   "result of trauma"   . FACIAL FRACTURE SURGERY Left 1990's   "result of trauma"   . FRACTURE SURGERY    . HERNIA REPAIR    . LEFT HEART CATHETERIZATION WITH CORONARY ANGIOGRAM Right 03/07/2013   Procedure: LEFT HEART CATHETERIZATION WITH CORONARY ANGIOGRAM;  Surgeon: [name], MD;  Location: [facility] CATH LAB;  Service: Cardiovascular;  Laterality: Right;  . UMBILICAL HERNIA REPAIR      Family History:  Family History  Problem Relation Age of Onset  . Hypertension Other   . Coronary artery disease Other     Social History:  reports that he has been smoking Cigarettes.  He has a 30.00 pack-year smoking history. His smokeless tobacco use includes Chew. He reports that he drinks about 7.2 oz of alcohol per week . He reports that he uses drugs, including Marijuana.  Additional Social History:     CIWA: CIWA-Ar BP: 120/88 Pulse Rate:  21 COWS:    PATIENT STRENGTHS: (choose at least two) Average or above average intelligence Motivation for treatment/growth  Allergies:  Allergies  Allergen Reactions  . Shellfish-Derived Products Nausea And Vomiting  . Trazodone And Nefazodone Other (See Comments)    Muscle spasms  . Reglan [Metoclopramide] Other (See Comments)    Unknown    Home Medications:  (Not in a hospital admission)  OB/GYN Status:  No LMP for male patient.  General Assessment Data Location of Assessment: [facility] Assessment Services TTS Assessment: In system Is this a Tele or Face-to-Face Assessment?: Face-to-Face Is this  an Initial Assessment or a Re-assessment for this encounter?: Initial Assessment Marital status: Single Living Arrangements: Parent Can pt return to current living arrangement?: Yes Admission Status: Voluntary Is patient capable of signing voluntary admission?: Yes Referral Source: Self/Family/Friend Insurance type:  (none)  Medical Screening Exam ([place]) Medical Exam completed: Yes  Crisis Care Plan Living Arrangements: Parent Name of Psychiatrist: [facility] NP Name of Therapist:  (None)  Education Status Is patient currently in school?: No  Risk to self with the past 6 months Suicidal Ideation: Yes-Currently Present Has patient been a risk to self within the past 6 months prior to admission? : No Suicidal Intent: No Has patient had any suicidal intent within the past 6 months prior to admission? : No Is patient at risk for suicide?: Yes Suicidal Plan?: No Has patient had any suicidal plan within the past 6 months prior to admission? : No Access to Means: No What has been your use of drugs/alcohol within the last 12 months?:  (alcohol and cannabis) Previous Attempts/Gestures: Yes How many times?: 1 Other Self Harm Risks:  (none) Triggers for Past Attempts: Spouse contact Intentional Self Injurious Behavior: None Family Suicide History: Unknown Recent stressful life event(s): Job Loss, Financial Problems Persecutory voices/beliefs?: No Depression: Yes Depression Symptoms: Tearfulness, Loss of interest in usual pleasures, Feeling worthless/self pity, Feeling angry/irritable Substance abuse history and/or treatment for substance abuse?: Yes Suicide prevention information given to non-admitted patients: Not applicable  Risk to Others within the past 6 months Homicidal Ideation: No Does patient have any lifetime risk of violence toward others beyond the six months prior to admission? : No Thoughts of Harm to Others: No Current Homicidal Intent: No Current Homicidal  Plan: No Access to Homicidal Means: No Identified Victim: none History of harm to others?: No Assessment of Violence: None Noted Violent Behavior Description: none Does patient have access to weapons?: No Criminal Charges Pending?: No Does patient have a court date: No Is patient on probation?: No  Psychosis Hallucinations: None noted Delusions: None noted  Mental Status Report Appearance/Hygiene: Unremarkable Eye Contact: Fair Motor Activity: Unremarkable Speech: Logical/coherent Level of Consciousness: Alert Mood: Depressed, Suspicious Affect: Depressed Anxiety Level: Minimal Judgement: Partial Orientation: Person, Place, Time, Situation Obsessive Compulsive Thoughts/Behaviors: None  Cognitive Functioning Concentration: Unable to Assess Memory: Recent Intact IQ: Average Insight: Fair Impulse Control: Poor Appetite: Fair Weight Loss: 0 Weight Gain: 0 Sleep: Decreased Total Hours of Sleep: 5 Vegetative Symptoms: None  ADLScreening [facility] Assessment Services) Patient's cognitive ability adequate to safely complete daily activities?: Yes Patient able to express need for assistance with ADLs?: Yes Independently performs ADLs?: Yes (appropriate for developmental age)  Prior Inpatient Therapy Prior Inpatient Therapy: Yes Prior Therapy Dates:  (2001 and 2005) Prior Therapy Facilty/Provider(s):  (out of state facility) Reason for Treatment: Si and SA  Prior Outpatient Therapy Prior Outpatient Therapy: Yes Prior Therapy Dates:  (2 yrs ago) Prior  Therapy Facilty/Provider(s):  [facility]) Reason for Treatment: Depression Does patient have an ACCT team?: No Does patient have Intensive In-House Services?  : No Does patient have [facility] services? : No Does patient have P4CC services?: No  ADL Screening (condition at time of admission) Patient's cognitive ability adequate to safely complete daily activities?: Yes Is the patient deaf or have difficulty hearing?: No Does  the patient have difficulty seeing, even when wearing glasses/contacts?: No Does the patient have difficulty concentrating, remembering, or making decisions?: No Patient able to express need for assistance with ADLs?: Yes Does the patient have difficulty dressing or bathing?: No Independently performs ADLs?: Yes (appropriate for developmental age)       Abuse/Neglect Assessment (Assessment to be complete while patient is alone) Physical Abuse: Denies Verbal Abuse: Denies Sexual Abuse: Denies     Regulatory affairs officer (For Healthcare) Does Patient Have a Medical Advance Directive?: No    Additional Information 1:1 In Past 12 Months?: No CIRT Risk: No Elopement Risk: No Does patient have medical clearance?: Yes     Disposition:  Disposition Initial Assessment Completed for this Encounter: Yes Disposition of Patient: Inpatient treatment program Type of inpatient treatment program: Adult  Mollie Germany 07/19/2016 2:23 PM

## 2016-07-19 NOTE — ED Triage Notes (Signed)
Patient arrived from [facility].  He is complaining of pain in his pancreas area.  He wishes to get treatment for alcohol.  [facility] states he needs to be medical cleared.

## 2016-07-19 NOTE — ED Notes (Signed)
Bed: WA27 Expected date:  Expected time:  Means of arrival:  Comments: Walk in from [facility]

## 2016-07-20 ENCOUNTER — Encounter (HOSPITAL_COMMUNITY): Payer: Self-pay | Admitting: *Deleted

## 2016-07-20 ENCOUNTER — Inpatient Hospital Stay (HOSPITAL_COMMUNITY)
Admission: AD | Admit: 2016-07-20 | Discharge: 2016-07-26 | DRG: 897 | Disposition: A | Discharge status: Home / Self Care | Payer: Federal, State, Local not specified - Other | Source: Intra-hospital | Attending: Psychiatry | Admitting: Psychiatry

## 2016-07-20 DIAGNOSIS — F332 Major depressive disorder, recurrent severe without psychotic features: Secondary | ICD-10-CM | POA: Diagnosis present

## 2016-07-20 DIAGNOSIS — R748 Abnormal levels of other serum enzymes: Secondary | ICD-10-CM | POA: Diagnosis present

## 2016-07-20 DIAGNOSIS — J45909 Unspecified asthma, uncomplicated: Secondary | ICD-10-CM | POA: Diagnosis present

## 2016-07-20 DIAGNOSIS — K219 Gastro-esophageal reflux disease without esophagitis: Secondary | ICD-10-CM | POA: Diagnosis present

## 2016-07-20 DIAGNOSIS — I10 Essential (primary) hypertension: Secondary | ICD-10-CM | POA: Diagnosis present

## 2016-07-20 DIAGNOSIS — F17210 Nicotine dependence, cigarettes, uncomplicated: Secondary | ICD-10-CM | POA: Diagnosis present

## 2016-07-20 DIAGNOSIS — K859 Acute pancreatitis without necrosis or infection, unspecified: Secondary | ICD-10-CM | POA: Diagnosis not present

## 2016-07-20 DIAGNOSIS — F1290 Cannabis use, unspecified, uncomplicated: Secondary | ICD-10-CM | POA: Diagnosis not present

## 2016-07-20 DIAGNOSIS — Z8711 Personal history of peptic ulcer disease: Secondary | ICD-10-CM

## 2016-07-20 DIAGNOSIS — Z888 Allergy status to other drugs, medicaments and biological substances status: Secondary | ICD-10-CM

## 2016-07-20 DIAGNOSIS — R9431 Abnormal electrocardiogram [ECG] [EKG]: Secondary | ICD-10-CM | POA: Diagnosis not present

## 2016-07-20 DIAGNOSIS — F10229 Alcohol dependence with intoxication, unspecified: Secondary | ICD-10-CM | POA: Diagnosis present

## 2016-07-20 DIAGNOSIS — F1990 Other psychoactive substance use, unspecified, uncomplicated: Secondary | ICD-10-CM | POA: Diagnosis present

## 2016-07-20 DIAGNOSIS — E7800 Pure hypercholesterolemia, unspecified: Secondary | ICD-10-CM | POA: Diagnosis present

## 2016-07-20 DIAGNOSIS — Z79899 Other long term (current) drug therapy: Secondary | ICD-10-CM

## 2016-07-20 DIAGNOSIS — D573 Sickle-cell trait: Secondary | ICD-10-CM | POA: Diagnosis present

## 2016-07-20 DIAGNOSIS — Y900 Blood alcohol level of less than 20 mg/100 ml: Secondary | ICD-10-CM | POA: Diagnosis present

## 2016-07-20 DIAGNOSIS — Z8249 Family history of ischemic heart disease and other diseases of the circulatory system: Secondary | ICD-10-CM

## 2016-07-20 DIAGNOSIS — F419 Anxiety disorder, unspecified: Secondary | ICD-10-CM | POA: Diagnosis present

## 2016-07-20 DIAGNOSIS — F1490 Cocaine use, unspecified, uncomplicated: Secondary | ICD-10-CM | POA: Diagnosis not present

## 2016-07-20 DIAGNOSIS — Z91013 Allergy to seafood: Secondary | ICD-10-CM

## 2016-07-20 DIAGNOSIS — F1994 Other psychoactive substance use, unspecified with psychoactive substance-induced mood disorder: Secondary | ICD-10-CM | POA: Diagnosis present

## 2016-07-20 DIAGNOSIS — Z8701 Personal history of pneumonia (recurrent): Secondary | ICD-10-CM | POA: Diagnosis not present

## 2016-07-20 DIAGNOSIS — F4310 Post-traumatic stress disorder, unspecified: Secondary | ICD-10-CM | POA: Diagnosis present

## 2016-07-20 DIAGNOSIS — K852 Alcohol induced acute pancreatitis without necrosis or infection: Secondary | ICD-10-CM

## 2016-07-20 DIAGNOSIS — K860 Alcohol-induced chronic pancreatitis: Secondary | ICD-10-CM | POA: Diagnosis present

## 2016-07-20 DIAGNOSIS — I456 Pre-excitation syndrome: Secondary | ICD-10-CM | POA: Diagnosis present

## 2016-07-20 DIAGNOSIS — M545 Low back pain: Secondary | ICD-10-CM | POA: Diagnosis present

## 2016-07-20 DIAGNOSIS — F1210 Cannabis abuse, uncomplicated: Secondary | ICD-10-CM | POA: Diagnosis present

## 2016-07-20 DIAGNOSIS — F1420 Cocaine dependence, uncomplicated: Secondary | ICD-10-CM | POA: Diagnosis present

## 2016-07-20 DIAGNOSIS — F1020 Alcohol dependence, uncomplicated: Secondary | ICD-10-CM | POA: Diagnosis present

## 2016-07-20 DIAGNOSIS — R45851 Suicidal ideations: Secondary | ICD-10-CM

## 2016-07-20 DIAGNOSIS — G8929 Other chronic pain: Secondary | ICD-10-CM | POA: Diagnosis present

## 2016-07-20 MED ORDER — MAGNESIUM HYDROXIDE 400 MG/5ML PO SUSP: 30.0000 mL | Freq: Every day | ORAL | Status: DC | PRN

## 2016-07-20 MED ORDER — ARIPIPRAZOLE 5 MG PO TABS
5.0000 mg | ORAL_TABLET | Freq: Every day | ORAL | Status: DC
  Administered 2016-07-20: 5 mg via ORAL
  Filled 2016-07-20 (×3): qty 1

## 2016-07-20 MED ORDER — METOPROLOL TARTRATE 25 MG PO TABS
25.0000 mg | ORAL_TABLET | Freq: Two times a day (BID) | ORAL | Status: DC
  Administered 2016-07-20: 25 mg via ORAL
  Filled 2016-07-20: qty 1

## 2016-07-20 MED ORDER — LORAZEPAM 1 MG PO TABS
1.0000 mg | ORAL_TABLET | Freq: Every day | ORAL | Status: AC
  Administered 2016-07-25: 1 mg via ORAL
  Filled 2016-07-20: qty 1

## 2016-07-20 MED ORDER — LORAZEPAM 1 MG PO TABS
1.0000 mg | ORAL_TABLET | Freq: Three times a day (TID) | ORAL | Status: AC
  Administered 2016-07-22 – 2016-07-23 (×3): 1 mg via ORAL
  Filled 2016-07-20 (×2): qty 1

## 2016-07-20 MED ORDER — LORAZEPAM 1 MG PO TABS
1.0000 mg | ORAL_TABLET | Freq: Two times a day (BID) | ORAL | Status: AC
  Administered 2016-07-23 – 2016-07-24 (×2): 1 mg via ORAL
  Filled 2016-07-20 (×2): qty 1

## 2016-07-20 MED ORDER — ADULT MULTIVITAMIN W/MINERALS CH
1.0000 | ORAL_TABLET | Freq: Every day | ORAL | Status: DC
  Administered 2016-07-20: 1 via ORAL
  Filled 2016-07-20 (×3): qty 1

## 2016-07-20 MED ORDER — LORAZEPAM 1 MG PO TABS: 0.0000 mg | ORAL_TABLET | Freq: Two times a day (BID) | ORAL | Status: DC

## 2016-07-20 MED ORDER — FLUTICASONE PROPIONATE 50 MCG/ACT NA SUSP
2.0000 | Freq: Every day | NASAL | Status: DC
  Administered 2016-07-21 – 2016-07-25 (×5): 2 via NASAL
  Filled 2016-07-20: qty 16

## 2016-07-20 MED ORDER — HYDROXYZINE HCL 25 MG PO TABS
25.0000 mg | ORAL_TABLET | Freq: Four times a day (QID) | ORAL | Status: DC | PRN
  Administered 2016-07-21: 25 mg via ORAL
  Filled 2016-07-20 (×2): qty 1

## 2016-07-20 MED ORDER — VITAMIN B-1 100 MG PO TABS
100.0000 mg | ORAL_TABLET | Freq: Every day | ORAL | Status: DC
  Administered 2016-07-20: 100 mg via ORAL
  Filled 2016-07-20: qty 1

## 2016-07-20 MED ORDER — KETOROLAC TROMETHAMINE 60 MG/2ML IM SOLN
15.0000 mg | Freq: Once | INTRAMUSCULAR | Status: AC
  Administered 2016-07-20: 15 mg via INTRAMUSCULAR
  Filled 2016-07-20 (×2): qty 2

## 2016-07-20 MED ORDER — IBUPROFEN 600 MG PO TABS
600.0000 mg | ORAL_TABLET | Freq: Four times a day (QID) | ORAL | Status: DC | PRN
  Administered 2016-07-20: 600 mg via ORAL
  Filled 2016-07-20: qty 1

## 2016-07-20 MED ORDER — METOPROLOL TARTRATE 25 MG PO TABS
25.0000 mg | ORAL_TABLET | Freq: Two times a day (BID) | ORAL | Status: DC
  Administered 2016-07-20 – 2016-07-25 (×11): 25 mg via ORAL
  Filled 2016-07-20: qty 1
  Filled 2016-07-20: qty 28
  Filled 2016-07-20 (×4): qty 1
  Filled 2016-07-20: qty 28
  Filled 2016-07-20 (×9): qty 1

## 2016-07-20 MED ORDER — GABAPENTIN 100 MG PO CAPS
200.0000 mg | ORAL_CAPSULE | Freq: Three times a day (TID) | ORAL | Status: DC
Start: 2016-07-20 — End: 2016-07-20
  Administered 2016-07-20: 200 mg via ORAL
  Filled 2016-07-20: qty 2

## 2016-07-20 MED ORDER — ADULT MULTIVITAMIN W/MINERALS CH
1.0000 | ORAL_TABLET | Freq: Every day | ORAL | Status: DC
  Administered 2016-07-21 – 2016-07-25 (×5): 1 via ORAL
  Filled 2016-07-20 (×7): qty 1

## 2016-07-20 MED ORDER — LORAZEPAM 1 MG PO TABS: 0.0000 mg | ORAL_TABLET | Freq: Four times a day (QID) | ORAL | Status: DC

## 2016-07-20 MED ORDER — ARIPIPRAZOLE 5 MG PO TABS: 5.0000 mg | ORAL_TABLET | Freq: Every day | ORAL | Status: DC

## 2016-07-20 MED ORDER — NICOTINE POLACRILEX 2 MG MT GUM
CHEWING_GUM | OROMUCOSAL | Status: AC
  Filled 2016-07-20: qty 1

## 2016-07-20 MED ORDER — HYDROCERIN EX CREA
TOPICAL_CREAM | Freq: Two times a day (BID) | CUTANEOUS | Status: DC
  Administered 2016-07-20 – 2016-07-21 (×3): via TOPICAL
  Administered 2016-07-22: 1 via TOPICAL
  Administered 2016-07-22 – 2016-07-24 (×3): via TOPICAL
  Administered 2016-07-25 (×2): 1 via TOPICAL
  Filled 2016-07-20 (×2): qty 113

## 2016-07-20 MED ORDER — SUCRALFATE 1 G PO TABS
1.0000 g | ORAL_TABLET | Freq: Three times a day (TID) | ORAL | Status: DC
  Administered 2016-07-20 – 2016-07-26 (×22): 1 g via ORAL
  Filled 2016-07-20 (×8): qty 1
  Filled 2016-07-20: qty 56
  Filled 2016-07-20 (×10): qty 1
  Filled 2016-07-20: qty 56
  Filled 2016-07-20 (×8): qty 1
  Filled 2016-07-20 (×2): qty 56
  Filled 2016-07-20: qty 1

## 2016-07-20 MED ORDER — ALUM & MAG HYDROXIDE-SIMETH 200-200-20 MG/5ML PO SUSP: 30.0000 mL | ORAL | Status: DC | PRN

## 2016-07-20 MED ORDER — BENZOCAINE 10 % MT GEL
Freq: Four times a day (QID) | OROMUCOSAL | Status: DC | PRN
  Administered 2016-07-24: 10:00:00 via OROMUCOSAL
  Filled 2016-07-20: qty 9.4

## 2016-07-20 MED ORDER — GABAPENTIN 100 MG PO CAPS
200.0000 mg | ORAL_CAPSULE | Freq: Three times a day (TID) | ORAL | Status: DC
  Administered 2016-07-20 – 2016-07-24 (×11): 200 mg via ORAL
  Filled 2016-07-20 (×17): qty 2

## 2016-07-20 MED ORDER — HYDROXYZINE HCL 25 MG PO TABS
25.0000 mg | ORAL_TABLET | Freq: Four times a day (QID) | ORAL | Status: DC | PRN
  Administered 2016-07-20: 25 mg via ORAL
  Filled 2016-07-20: qty 1

## 2016-07-20 MED ORDER — SERTRALINE HCL 50 MG PO TABS
75.0000 mg | ORAL_TABLET | Freq: Every day | ORAL | Status: DC
  Administered 2016-07-20: 75 mg via ORAL
  Filled 2016-07-20: qty 2

## 2016-07-20 MED ORDER — PANCRELIPASE (LIP-PROT-AMYL) 12000-38000 UNITS PO CPEP
24000.0000 [IU] | ORAL_CAPSULE | Freq: Three times a day (TID) | ORAL | Status: DC
  Administered 2016-07-20 – 2016-07-26 (×17): 24000 [IU] via ORAL
  Filled 2016-07-20 (×6): qty 2
  Filled 2016-07-20: qty 84
  Filled 2016-07-20 (×3): qty 2
  Filled 2016-07-20: qty 84
  Filled 2016-07-20 (×11): qty 2
  Filled 2016-07-20: qty 84

## 2016-07-20 MED ORDER — LORAZEPAM 1 MG PO TABS: 1.0000 mg | ORAL_TABLET | Freq: Three times a day (TID) | ORAL | Status: DC | PRN

## 2016-07-20 MED ORDER — THIAMINE HCL 100 MG/ML IJ SOLN
100.0000 mg | Freq: Once | INTRAMUSCULAR | Status: AC
Start: 2016-07-20 — End: 2016-07-20
  Administered 2016-07-20: 100 mg via INTRAMUSCULAR
  Filled 2016-07-20: qty 2

## 2016-07-20 MED ORDER — ONDANSETRON HCL 4 MG PO TABS: 4.0000 mg | ORAL_TABLET | Freq: Three times a day (TID) | ORAL | Status: DC | PRN

## 2016-07-20 MED ORDER — VITAMIN B-1 100 MG PO TABS
100.0000 mg | ORAL_TABLET | Freq: Every day | ORAL | Status: DC
  Administered 2016-07-21 – 2016-07-25 (×5): 100 mg via ORAL
  Filled 2016-07-20 (×9): qty 1

## 2016-07-20 MED ORDER — BOOST PLUS PO LIQD
237.0000 mL | Freq: Three times a day (TID) | ORAL | Status: DC
  Administered 2016-07-20 – 2016-07-26 (×15): 237 mL via ORAL
  Filled 2016-07-20 (×20): qty 237

## 2016-07-20 MED ORDER — GI COCKTAIL ~~LOC~~
30.0000 mL | Freq: Three times a day (TID) | ORAL | Status: DC | PRN
  Administered 2016-07-20 – 2016-07-26 (×19): 30 mL via ORAL
  Filled 2016-07-20 (×21): qty 30

## 2016-07-20 MED ORDER — FAMOTIDINE 20 MG PO TABS
20.0000 mg | ORAL_TABLET | Freq: Two times a day (BID) | ORAL | Status: DC
  Administered 2016-07-20 – 2016-07-25 (×11): 20 mg via ORAL
  Filled 2016-07-20 (×8): qty 1
  Filled 2016-07-20: qty 28
  Filled 2016-07-20 (×4): qty 1
  Filled 2016-07-20: qty 28
  Filled 2016-07-20 (×2): qty 1

## 2016-07-20 MED ORDER — LORAZEPAM 1 MG PO TABS
1.0000 mg | ORAL_TABLET | Freq: Four times a day (QID) | ORAL | Status: AC
  Administered 2016-07-20 – 2016-07-22 (×6): 1 mg via ORAL
  Filled 2016-07-20 (×6): qty 1

## 2016-07-20 MED ORDER — LORAZEPAM 1 MG PO TABS
1.0000 mg | ORAL_TABLET | Freq: Four times a day (QID) | ORAL | Status: AC | PRN
  Administered 2016-07-22 – 2016-07-23 (×3): 1 mg via ORAL
  Filled 2016-07-20 (×5): qty 1

## 2016-07-20 MED ORDER — LORATADINE 10 MG PO TABS
10.0000 mg | ORAL_TABLET | Freq: Every day | ORAL | Status: DC
  Administered 2016-07-21 – 2016-07-25 (×5): 10 mg via ORAL
  Filled 2016-07-20 (×2): qty 1
  Filled 2016-07-20: qty 14
  Filled 2016-07-20 (×5): qty 1

## 2016-07-20 MED ORDER — ACETAMINOPHEN 325 MG PO TABS: 650.0000 mg | ORAL_TABLET | Freq: Four times a day (QID) | ORAL | Status: DC | PRN

## 2016-07-20 MED ORDER — LOPERAMIDE HCL 2 MG PO CAPS: 2.0000 mg | ORAL_CAPSULE | ORAL | Status: AC | PRN

## 2016-07-20 MED ORDER — CHLORHEXIDINE GLUCONATE 0.12 % MT SOLN
15.0000 mL | Freq: Four times a day (QID) | OROMUCOSAL | Status: DC
  Administered 2016-07-20 – 2016-07-25 (×17): 15 mL via OROMUCOSAL
  Filled 2016-07-20 (×27): qty 15

## 2016-07-20 MED ORDER — CHOLESTYRAMINE 4 G PO PACK
4.0000 g | PACK | Freq: Three times a day (TID) | ORAL | Status: DC | PRN
Start: 2016-07-20 — End: 2016-07-20
  Filled 2016-07-20: qty 1

## 2016-07-20 MED ORDER — SERTRALINE HCL 25 MG PO TABS
75.0000 mg | ORAL_TABLET | Freq: Every day | ORAL | Status: DC
  Administered 2016-07-21 – 2016-07-25 (×5): 75 mg via ORAL
  Filled 2016-07-20: qty 42
  Filled 2016-07-20 (×7): qty 3

## 2016-07-20 MED ORDER — PANTOPRAZOLE SODIUM 40 MG PO TBEC
40.0000 mg | DELAYED_RELEASE_TABLET | Freq: Two times a day (BID) | ORAL | Status: DC
  Administered 2016-07-20 – 2016-07-25 (×11): 40 mg via ORAL
  Filled 2016-07-20 (×7): qty 1
  Filled 2016-07-20: qty 28
  Filled 2016-07-20: qty 1
  Filled 2016-07-20: qty 28
  Filled 2016-07-20 (×6): qty 1

## 2016-07-20 MED ORDER — NICOTINE POLACRILEX 2 MG MT GUM
2.0000 mg | CHEWING_GUM | OROMUCOSAL | Status: DC | PRN
  Administered 2016-07-20 – 2016-07-25 (×10): 2 mg via ORAL
  Filled 2016-07-20 (×2): qty 1

## 2016-07-20 MED ORDER — CHOLESTYRAMINE LIGHT 4 G PO PACK
4.0000 g | PACK | Freq: Three times a day (TID) | ORAL | Status: DC | PRN
  Filled 2016-07-20: qty 1

## 2016-07-20 MED ORDER — ONDANSETRON 4 MG PO TBDP
4.0000 mg | ORAL_TABLET | Freq: Four times a day (QID) | ORAL | Status: AC | PRN
  Administered 2016-07-22: 4 mg via ORAL
  Filled 2016-07-20 (×2): qty 1

## 2016-07-20 MED ORDER — GI COCKTAIL ~~LOC~~
30.0000 mL | Freq: Three times a day (TID) | ORAL | Status: DC | PRN
  Filled 2016-07-20: qty 30

## 2016-07-20 MED ORDER — THIAMINE HCL 100 MG/ML IJ SOLN: 100.0000 mg | Freq: Every day | INTRAMUSCULAR | Status: DC

## 2016-07-20 NOTE — ED Notes (Signed)
Patient is calm and cooperative at this time. Patient denies SI, Hi and AVH at this time. Plan of care discussed with patient voices no complaints or concerns at this time. Encouragement and support provided and safety maintain. Q 15 min safety checks remain in place and video monitoring.

## 2016-07-20 NOTE — Progress Notes (Signed)
Pt admitted to [facility] for help with substance abuse and anger. Pt reports drinking 1/2 to a case daily of beer. He uses THC weekly and reports last cocaine use was 4 months ago, however he tested positive for cocaine. Pt has a medical hx of pancreatitis, HTN, heart murmur, asthma, anxiety, depression , bipolar disorder and arthritis. He lives with his mother and has a fiance. She recently had a miscarriage and was diagnosed with cervical cancer. He says that he sometimes has auditory hallucinations of his dead grandfather walking up the steps, ringing the door bell and calling his name. Pt says that he has anger issues and recently ripped the phone from the wall and pulled down a bookshelf. He has a hx of OD in 2015. Pt currently denies si and hi.

## 2016-07-20 NOTE — Consult Note (Signed)
[facility] Face-to-Face Psychiatry Consult   Reason for Consult: Psychiatric evaluation Referring Physician:  EDP Patient Identification: [name] MRN:  [id number] Principal Diagnosis: MDD (major depressive disorder), recurrent severe, without psychosis (HCC) Diagnosis:   Patient Active Problem List   Diagnosis Date Noted  . Polysubstance abuse (tobacco, cocaine, THC, and ETOH) [F19.10] 03/26/2015    Priority: High  . MDD (major depressive disorder), recurrent severe, without psychosis (HCC) [F33.2] 10/22/2013    Priority: High  . Leukocytosis [D72.829]   . Hospital acquired PNA [J18.9] 05/20/2015  . Pancreatitis [K85.90] 05/18/2015  . Pseudocyst of pancreas [K86.3] 05/18/2015  . Malnutrition of moderate degree [E44.0] 03/27/2015  . Abnormal transaminases [R74.8] 03/26/2015  . Acute pancreatitis [K85.90] 03/26/2015  . Alcohol-induced chronic pancreatitis (HCC) [K86.0]   . Essential hypertension [I10] 02/06/2014  . Alcohol-induced acute pancreatitis [K85.20] 11/28/2013  . Pancreatic pseudocyst/cyst [O28.6, K86.3] 11/25/2013  . Alcohol dependence (HCC) [F10.20] 10/23/2013  . Protein-calorie malnutrition, severe (HCC) [E43] 10/10/2013  . Suicide attempt [T14.91XA] 10/08/2013  . Overdose [T50.901A] 10/06/2013  . [name] Parkinson White pattern seen on electrocardiogram [I45.6] 10/03/2012  . TOBACCO ABUSE [F17.200] 03/23/2007    Total Time spent with patient: 45 minutes  Subjective:   [name] is a 46 y.o. male patient admitted with suicidal thoughts, alcohol abuse.  HPI:  Patient with long history of Alcoholism and Major depression who presents to [facility] with suicidal  thoughts with no plan or intent. Patient reports inability to stop drinking, thoughts of life  not worth living, delusional thinking, mood swings, hearing voices and anger problem. Patient is requesting help with alcohol and other illicit drug use.  Past Psychiatric History: as above  Risk to Self:  Is patient at risk for suicide?: No, but patient needs Medical Clearance Risk to Others:   Prior Inpatient Therapy:   Prior Outpatient Therapy:    Past Medical History:  Past Medical History:  Diagnosis Date  . Alcoholism /alcohol abuse (HCC)   . Anemia   . Anxiety   . Arthritis    "knees; arms; elbows" (03/26/2015)  . Asthma   . Bipolar disorder (HCC)   . Chronic bronchitis (HCC)   . Chronic lower back pain   . Cocaine abuse   . Depression   . Family history of adverse reaction to anesthesia    "grandmother gets confused"  . Femoral condyle fracture (HCC) 03/08/2014   left medial/notes 03/09/2014  . GERD (gastroesophageal reflux disease)   . H/O hiatal hernia   . H/O suicide attempt 10/2012  . Heart murmur    "when he was little" (03/06/2013)  . High cholesterol   . History of blood transfusion 10/2012   "when I tried to commit suicide"  . History of stomach ulcers   . Hypertension   . Marijuana abuse, continuous   . Migraine    "a few times/year" (03/26/2015)  . Pancreatitis   . Pneumonia 1990's X 3  . PTSD (post-traumatic stress disorder)   . Shortness of breath    "can happen at anytime" (03/06/2013)  . Sickle cell trait (HCC)   . WPW (Wolff-Parkinson-White syndrome)    [name] 03/06/2013    Past Surgical History:  Procedure Laterality Date  . CARDIAC CATHETERIZATION    . EYE SURGERY Left 1990's   "result of trauma"   . FACIAL FRACTURE SURGERY Left 1990's   "result of trauma"   . FRACTURE SURGERY    . HERNIA REPAIR    . LEFT HEART CATHETERIZATION WITH CORONARY  ANGIOGRAM Right 03/07/2013   Procedure: LEFT HEART CATHETERIZATION WITH CORONARY ANGIOGRAM;  Surgeon: [name], MD;  Location: [place] CATH [facility];  Service: Cardiovascular;  Laterality: Right;  . UMBILICAL HERNIA REPAIR     Family History:  Family History  Problem Relation Age of Onset  . Hypertension Other   . Coronary artery disease Other    Family Psychiatric  History:  Social History:   History  Alcohol Use  . 7.2 oz/week  . 12 Cans of beer per week     History  Drug Use  . Types: Marijuana    Social History   Social History  . Marital status: Single    Spouse name: N/A  . Number of children: N/A  . Years of education: N/A   Social History Main Topics  . Smoking status: Current Every Day Smoker    Packs/day: 1.00    Years: 30.00    Types: Cigarettes  . Smokeless tobacco: Current User    Types: Chew  . Alcohol use 7.2 oz/week    12 Cans of beer per week  . Drug use: Yes    Types: Marijuana  . Sexual activity: Not Currently   Other Topics Concern  . None   Social History Narrative  . None   Additional Social History:    Allergies:   Allergies  Allergen Reactions  . Shellfish-Derived Products Nausea And Vomiting  . Trazodone And Nefazodone Other (See Comments)    Reaction:  Muscle spasms  . Reglan [Metoclopramide] Other (See Comments)    Reaction:  Muscle spasms    Labs:  Results for orders placed or performed during the hospital encounter of 07/19/16 (from the past 48 hour(s))  Rapid urine drug screen (hospital performed)     Status: Abnormal   Collection Time: 07/19/16  2:23 PM  Result Value Ref Range   Opiates NONE DETECTED NONE DETECTED   Cocaine POSITIVE (A) NONE DETECTED   Benzodiazepines NONE DETECTED NONE DETECTED   Amphetamines NONE DETECTED NONE DETECTED   Tetrahydrocannabinol NONE DETECTED NONE DETECTED   Barbiturates NONE DETECTED NONE DETECTED    Comment:        DRUG SCREEN FOR MEDICAL PURPOSES ONLY.  IF CONFIRMATION IS NEEDED FOR ANY PURPOSE, NOTIFY LAB WITHIN 5 DAYS.        LOWEST DETECTABLE LIMITS FOR URINE DRUG SCREEN Drug Class       Cutoff (ng/mL) Amphetamine      1000 Barbiturate      200 Benzodiazepine   474 Tricyclics       259 Opiates          300 Cocaine          300 THC              50   Comprehensive metabolic panel     Status: Abnormal   Collection Time: 07/19/16  2:57 PM  Result Value Ref Range    Sodium 142 135 - 145 mmol/L   Potassium 3.4 (L) 3.5 - 5.1 mmol/L   Chloride 108 101 - 111 mmol/L   CO2 26 22 - 32 mmol/L   Glucose, Bld 61 (L) 65 - 99 mg/dL   BUN 6 6 - 20 mg/dL   Creatinine, Ser 0.66 0.61 - 1.24 mg/dL   Calcium 9.0 8.9 - 10.3 mg/dL   Total Protein 8.3 (H) 6.5 - 8.1 g/dL   Albumin 4.5 3.5 - 5.0 g/dL   AST 46 (H) 15 - 41 U/L   ALT  40 17 - 63 U/L   Alkaline Phosphatase 92 38 - 126 U/L   Total Bilirubin 0.7 0.3 - 1.2 mg/dL   GFR calc non Af Amer >60 >60 mL/min   GFR calc Af Amer >60 >60 mL/min    Comment: (NOTE) The eGFR has been calculated using the CKD EPI equation. This calculation has not been validated in all clinical situations. eGFR's persistently <60 mL/min signify possible Chronic Kidney Disease.    Anion gap 8 5 - 15  Ethanol     Status: Abnormal   Collection Time: 07/19/16  2:57 PM  Result Value Ref Range   Alcohol, Ethyl (B) 98 (H) <5 mg/dL    Comment:        LOWEST DETECTABLE LIMIT FOR SERUM ALCOHOL IS 5 mg/dL FOR MEDICAL PURPOSES ONLY   Salicylate level     Status: None   Collection Time: 07/19/16  2:57 PM  Result Value Ref Range   Salicylate Lvl <9.4 2.8 - 30.0 mg/dL  Acetaminophen level     Status: Abnormal   Collection Time: 07/19/16  2:57 PM  Result Value Ref Range   Acetaminophen (Tylenol), Serum <10 (L) 10 - 30 ug/mL    Comment:        THERAPEUTIC CONCENTRATIONS VARY SIGNIFICANTLY. A RANGE OF 10-30 ug/mL MAY BE AN EFFECTIVE CONCENTRATION FOR MANY PATIENTS. HOWEVER, SOME ARE BEST TREATED AT CONCENTRATIONS OUTSIDE THIS RANGE. ACETAMINOPHEN CONCENTRATIONS >150 ug/mL AT 4 HOURS AFTER INGESTION AND >50 ug/mL AT 12 HOURS AFTER INGESTION ARE OFTEN ASSOCIATED WITH TOXIC REACTIONS.   cbc     Status: Abnormal   Collection Time: 07/19/16  2:57 PM  Result Value Ref Range   WBC 7.8 4.0 - 10.5 K/uL   RBC 3.80 (L) 4.22 - 5.81 MIL/uL   Hemoglobin 11.0 (L) 13.0 - 17.0 g/dL   HCT 33.5 (L) 39.0 - 52.0 %   MCV 88.2 78.0 - 100.0 fL   MCH  28.9 26.0 - 34.0 pg   MCHC 32.8 30.0 - 36.0 g/dL   RDW 15.0 11.5 - 15.5 %   Platelets 274 150 - 400 K/uL    Current Facility-Administered Medications  Medication Dose Route Frequency Provider Last Rate Last Dose  . alum & mag hydroxide-simeth (MAALOX/MYLANTA) 200-200-20 MG/5ML suspension 30 mL  30 mL Oral PRN [name], MD      . ARIPiprazole (ABILIFY) tablet 5 mg  5 mg Oral QHS [name], MD      . cholestyramine Lucrezia Starch) packet 4 g  4 g Oral TID PRN [name], MD      . famotidine (PEPCID) tablet 20 mg  20 mg Oral BID Leo Grosser, MD   20 mg at 07/20/16 1028  . gabapentin (NEURONTIN) capsule 200 mg  200 mg Oral TID Corena Pilgrim, MD      . hydrOXYzine (ATARAX/VISTARIL) tablet 25 mg  25 mg Oral Q6H PRN [name], NP   25 mg at 07/20/16 1027  . lipase/protease/amylase (CREON) capsule 24,000 Units  24,000 Units Oral TID [facility] [name], MD   24,000 Units at 07/20/16 [id number]  . LORazepam (ATIVAN) tablet 0-4 mg  0-4 mg Oral Q6H [name], MD       Followed by  . [START ON 07/22/2016] LORazepam (ATIVAN) tablet 0-4 mg  0-4 mg Oral Q12H [name], MD      . metoprolol tartrate (LOPRESSOR) tablet 25 mg  25 mg Oral BID [name], MD   25 mg at 07/20/16 1028  .  ondansetron (ZOFRAN) tablet 4 mg  4 mg Oral Q8H PRN [name], MD      . pantoprazole (PROTONIX) EC tablet 40 mg  40 mg Oral BID [name], MD   40 mg at 07/20/16 1028  . sertraline (ZOLOFT) tablet 75 mg  75 mg Oral Daily [name], MD   75 mg at 07/20/16 1027  . sucralfate (CARAFATE) tablet 1 g  1 g Oral TID WC & HS Berton Mount, RPH   1 g at 07/20/16 6834  . thiamine (VITAMIN B-1) tablet 100 mg  100 mg Oral Daily [name], MD   100 mg at 07/20/16 1028   Or  . thiamine (B-1) injection 100 mg  100 mg Intravenous Daily [name], MD       Current Outpatient Prescriptions  Medication Sig Dispense Refill  . albuterol (PROVENTIL HFA;VENTOLIN HFA) 108 (90 BASE) MCG/ACT inhaler  Inhale 2 puffs into the lungs every 6 (six) hours as needed for wheezing or shortness of breath.     [name] aluminum-magnesium hydroxide-simethicone (MAALOX) 196-222-97 MG/5ML SUSP Take 30 mLs by mouth every 6 (six) hours as needed (for indigestion/heartburn).    . ARIPiprazole (ABILIFY) 5 MG tablet Take 5 mg by mouth at bedtime.     . cholestyramine (QUESTRAN) 4 g packet Take 1 packet (4 g total) by mouth 3 (three) times daily as needed (diarrhea). 60 each 12  . fluticasone (FLONASE) 50 MCG/ACT nasal spray Place 1 spray into both nostrils daily as needed for rhinitis.     [name] lipase/protease/amylase (CREON) 12000 UNITS CPEP capsule Take 2 capsules (24,000 Units total) by mouth 3 (three) times daily with meals. 270 capsule 3  . metoprolol tartrate (LOPRESSOR) 25 MG tablet Take 1 tablet (25 mg total) by mouth 2 (two) times daily. 180 tablet 3  . ondansetron (ZOFRAN) 4 MG tablet Take 1 tablet (4 mg total) by mouth every 8 (eight) hours as needed for nausea or vomiting. 12 tablet 0  . pantoprazole (PROTONIX) 20 MG tablet Take 2 tablets (40 mg total) by mouth daily. 30 tablet 0  . sertraline (ZOLOFT) 25 MG tablet Take 75 mg by mouth daily.    . sildenafil (VIAGRA) 50 MG tablet Take 1 tablet (50 mg total) by mouth daily as needed for erectile dysfunction. 10 tablet 3  . sucralfate (CARAFATE) 1 g tablet Take 1 tablet (1 g total) by mouth 4 (four) times daily -  with meals and at bedtime. 30 tablet 0    Musculoskeletal: Strength & Muscle Tone: within normal limits Gait & Station: normal Patient leans: N/A  Psychiatric Specialty Exam: Physical Exam  Psychiatric: His speech is normal. His mood appears anxious. He is agitated and actively hallucinating. Thought content is paranoid. Cognition and memory are normal. He expresses impulsivity. He exhibits a depressed mood. He expresses suicidal ideation.    Review of Systems  Constitutional: Positive for malaise/fatigue.  HENT: Negative.   Eyes: Negative.    Respiratory: Negative.   Cardiovascular: Negative.   Gastrointestinal: Negative.   Genitourinary: Negative.   Musculoskeletal: Positive for myalgias.  Skin: Negative.   Neurological: Positive for tremors.  Endo/Heme/Allergies: Negative.   Psychiatric/Behavioral: Positive for depression, hallucinations, substance abuse and suicidal ideas. The patient is nervous/anxious and has insomnia.     Blood pressure 132/84, pulse 74, temperature 97.6 F (36.4 C), temperature source Oral, resp. rate 16, height '5\' 9"'$  (1.753 m), weight 67.6 kg (149 lb), SpO2 100 %.Body mass index is 22 kg/m.  General  Appearance: Disheveled  Eye Contact:  Good  Speech:  Clear and Coherent  Volume:  Increased  Mood:  Irritable  Affect:  Labile and Full Range  Thought Process:  Coherent and Descriptions of Associations: Intact  Orientation:  Full (Time, Place, and Person)  Thought Content:  Logical  Suicidal Thoughts:  Yes.  without intent/plan  Homicidal Thoughts:  No  Memory:  Immediate;   Good Recent;   Fair Remote;   Good  Judgement:  Poor  Insight:  Shallow  Psychomotor Activity:  Increased and Restlessness  Concentration:  Concentration: Fair and Attention Span: Poor  Recall:  [employer] of Knowledge:  Fair  Language:  Good  Akathisia:  No  Handed:  Right  AIMS (if indicated):     Assets:  Communication Skills  ADL's:  Intact  Cognition:  WNL  Sleep:   poor     Treatment Plan Summary: Daily contact with patient to assess and evaluate symptoms and progress in treatment and Medication management  Continue Lorazepam detox protocol Start Gabapentin 200 mg tid for cocaine, alcohol withdrawal and pain.  Disposition: Recommend psychiatric Inpatient admission when medically cleared.  Corena Pilgrim, MD 07/20/2016 10:41 AM

## 2016-07-20 NOTE — Progress Notes (Signed)
NUTRITION ASSESSMENT  Pt identified as at risk on the Malnutrition Screen Tool  INTERVENTION: 1. Educated patient on the importance of nutrition and encouraged intake of food and beverages. 2. Discussed weight goals. 3. Supplements: will order Boost Breeze po TID, each supplement provides 250 kcal and 9 grams of protein  NUTRITION DIAGNOSIS: Unintentional weight loss related to sub-optimal intake as evidenced by pt report.   Goal: Pt to meet >/= 90% of their estimated nutrition needs.  Monitor:  PO intake  Assessment:  Pt admitted for malaise/fatigue, depression, substance abuse, anxiety, and insomnia. Pt also reported SI on admission. On admission pt reported that he had been drinking alcohol. Per chart review, pt has lost 14 lbs (9.6% body weight) in the past 1 month which is significant for time frame.  Pt reports chronic abdominal pain; PMH includes alcohol-inducted pancreatitis. Noted that Creon has been ordered during admission. Will provide Boost Breeze supplement as it does not contain fat. Continue to encourage PO intakes of meals and snacks.    46 y.o. male  Height: Ht Readings from Last 1 Encounters:  07/20/16 5\' 8"  (1.727 m)    Weight: Wt Readings from Last 1 Encounters:  07/20/16 131 lb (59.4 kg)    Weight Hx: Wt Readings from Last 10 Encounters:  07/20/16 131 lb (59.4 kg)  07/19/16 149 lb (67.6 kg)  07/16/16 149 lb (67.6 kg)  07/03/16 145 lb (65.8 kg)  06/12/16 145 lb (65.8 kg)  04/26/16 150 lb (68 kg)  04/24/16 150 lb (68 kg)  04/24/16 150 lb (68 kg)  03/11/16 130 lb (59 kg)  02/20/16 134 lb 3.2 oz (60.9 kg)    BMI:  Body mass index is 19.92 kg/m. Pt meets criteria for normal weight based on current BMI.  Estimated Nutritional Needs: Kcal: 25-30 kcal/kg Protein: > 1 gram protein/kg Fluid: 1 ml/kcal  Diet Order: Diet regular Room service appropriate? Yes; Fluid consistency: Thin Pt is also offered choice of unit snacks mid-morning and  mid-afternoon.  Pt is eating as desired.   Lab results and medications reviewed.      Jarome Matin, MS, RD, LDN, [facility] Inpatient Clinical Dietitian Pager # [phone] After hours/weekend pager # [phone]

## 2016-07-20 NOTE — Progress Notes (Signed)
Writer verified Oxycodone 5-325 mg at [facility]. Pt last filled prescription on Feb 4th, Quantity 10.

## 2016-07-20 NOTE — BH Assessment (Signed)
[place] Assessment Progress Note  Per Corena Pilgrim, MD, this pt requires psychiatric hospitalization at this time.  Letitia Libra, RN, [facility] has assigned pt to [facility] Rm 300-2.  Pt has signed Voluntary Admission and Consent for Treatment, as well as Consent to Release Information, and signed forms have been faxed to [facility].  [name] nurse, Nena Jordan, has been notified, and agrees to send original paperwork along with pt via [facility], and to call report to [phone].  Jalene Mullet, [place] Triage Specialist [phone]

## 2016-07-20 NOTE — Progress Notes (Signed)
Pt said group waste of his time. He is not going to attend group. Pt came to group 20:25. Pt left group before 21:00 and did not return.

## 2016-07-20 NOTE — Progress Notes (Deleted)
Recreation Therapy Notes  Date: 07/20/16 Time: 0930 Location: 300 Hall Dayroom  Group Topic: Stress Management  Goal Area(s) Addresses:  Patient will verbalize importance of using healthy stress management.  Patient will identify positive emotions associated with healthy stress management.   Intervention: Stress Management  Activity :  Guided Imagery.  LRT introduced the stress management technique of guided imagery.  LRT read a script to allow patients to go one a mental vacation.  Patients were to follow along as LRT read script to engage in the activity.  Education:  Stress Management, Discharge Planning.   Education Outcome: Acknowledges edcuation/In group clarification offered/Needs additional education  Clinical Observations/Feedback: Pt did not attend group.   Victorino Sparrow, LRT/CTRS         Ria Comment, Corin Formisano A 07/20/2016 1:11 PM

## 2016-07-20 NOTE — Tx Team (Signed)
Initial Treatment Plan 07/20/2016 1:46 PM [name] [id number]    PATIENT STRESSORS: Health problems Loss of fiance miscarriage Substance abuse   PATIENT STRENGTHS: Ability for insight Communication skills General fund of knowledge Motivation for treatment/growth Supportive family/friends   PATIENT IDENTIFIED PROBLEMS: Substance abuse  anger                   DISCHARGE CRITERIA:  Ability to meet basic life and health needs Adequate post-discharge living arrangements Improved stabilization in mood, thinking, and/or behavior Medical problems require only outpatient monitoring Motivation to continue treatment in a less acute level of care Need for constant or close observation no longer present Reduction of life-threatening or endangering symptoms to within safe limits Safe-care adequate arrangements made Verbal commitment to aftercare and medication compliance Withdrawal symptoms are absent or subacute and managed without 24-hour nursing intervention  PRELIMINARY DISCHARGE PLAN: Attend aftercare/continuing care group Attend 12-step recovery group Outpatient therapy Return to previous living arrangement  PATIENT/FAMILY INVOLVEMENT: This treatment plan has been presented to and reviewed with the patient, [name], and/or family member,.  The patient and family have been given the opportunity to ask questions and make suggestions.  Mosie Lukes, RN 07/20/2016, 1:46 PM

## 2016-07-20 NOTE — BHH Group Notes (Signed)
[place] LCSW Group Therapy  07/20/2016 1:15pm  Type of Therapy: Group Therapy   Topic: Overcoming Obstacles  Participation Level: Pt invited. Did not attend.

## 2016-07-20 NOTE — ED Notes (Signed)
Pt discharged ambulatory with [place] driver.  All belongings were sent with pt.

## 2016-07-20 NOTE — BHH Counselor (Signed)
Pt has been accepted to [facility] by Darleene Cleaver MD & Waylan Boga DNP. Support paperwork signed and faxed to [facility] [employer] placed in pt's chart. Per [facility], pt has be transferred anytime.   Arnold Long,  Therapeutic Triage Specialist

## 2016-07-21 DIAGNOSIS — F1994 Other psychoactive substance use, unspecified with psychoactive substance-induced mood disorder: Secondary | ICD-10-CM

## 2016-07-21 DIAGNOSIS — J45909 Unspecified asthma, uncomplicated: Secondary | ICD-10-CM

## 2016-07-21 DIAGNOSIS — K219 Gastro-esophageal reflux disease without esophagitis: Secondary | ICD-10-CM

## 2016-07-21 DIAGNOSIS — I456 Pre-excitation syndrome: Secondary | ICD-10-CM

## 2016-07-21 DIAGNOSIS — K859 Acute pancreatitis without necrosis or infection, unspecified: Secondary | ICD-10-CM

## 2016-07-21 LAB — LIPASE, BLOOD: LIPASE: 30 U/L (ref 11–51)

## 2016-07-21 MED ORDER — NALTREXONE HCL 50 MG PO TABS
50.0000 mg | ORAL_TABLET | Freq: Every day | ORAL | Status: DC
  Administered 2016-07-21 – 2016-07-25 (×5): 50 mg via ORAL
  Filled 2016-07-21: qty 1
  Filled 2016-07-21: qty 14
  Filled 2016-07-21 (×7): qty 1

## 2016-07-21 MED ORDER — HYDROXYZINE HCL 25 MG PO TABS
25.0000 mg | ORAL_TABLET | Freq: Once | ORAL | Status: AC
  Administered 2016-07-21: 25 mg via ORAL
  Filled 2016-07-21 (×2): qty 1

## 2016-07-21 MED ORDER — ALBUTEROL SULFATE HFA 108 (90 BASE) MCG/ACT IN AERS
2.0000 | INHALATION_SPRAY | Freq: Four times a day (QID) | RESPIRATORY_TRACT | Status: DC | PRN
  Administered 2016-07-22 – 2016-07-24 (×3): 2 via RESPIRATORY_TRACT
  Filled 2016-07-21 (×2): qty 6.7

## 2016-07-21 NOTE — BHH Group Notes (Signed)
[place] LCSW Group Therapy  07/21/2016 12:30 PM  Type of Therapy:  Group Therapy  Participation Level:  Did Not Attend-pt invited. Chose to rest in room.   Summary of Progress/Problems: Today's Topic: Overcoming Obstacles. Patients identified one short term goal and potential obstacles in reaching this goal. Patients processed barriers involved in overcoming these obstacles. Patients identified steps necessary for overcoming these obstacles and explored motivation (internal and external) for facing these difficulties head on.   Mahogony Gilchrest N Smart LCSW 07/21/2016, 12:30 PM

## 2016-07-21 NOTE — Tx Team (Signed)
Interdisciplinary Treatment and Diagnostic Plan Update  07/21/2016 Time of Session: 0930 [name] MRN: [id number]  Principal Diagnosis: Bipolar Disorder with Psychotic Features   Secondary Diagnoses: Active Problems:   Major depressive disorder, recurrent severe without psychotic features ([place])   Current Medications:  Current Facility-Administered Medications  Medication Dose Route Frequency Provider Last Rate Last Dose  . ARIPiprazole (ABILIFY) tablet 5 mg  5 mg Oral QHS [name], NP   5 mg at 07/20/16 2138  . benzocaine (ORAJEL) 10 % mucosal gel   Mouth/Throat QID PRN Laverle Hobby, PA-C      . chlorhexidine (PERIDEX) 0.12 % solution 15 mL  15 mL Mouth/Throat QID Laverle Hobby, PA-C   15 mL at 07/21/16 0818  . cholestyramine light (PREVALITE) packet 4 g  4 g Oral TID PRN [name], NP      . famotidine (PEPCID) tablet 20 mg  20 mg Oral BID [name], NP   20 mg at 07/21/16 0815  . fluticasone (FLONASE) 50 MCG/ACT nasal spray 2 spray  2 spray Each Nare Daily Laverle Hobby, PA-C   2 spray at 07/21/16 0813  . gabapentin (NEURONTIN) capsule 200 mg  200 mg Oral TID [name], NP   200 mg at 07/21/16 0815  . gi cocktail (Maalox,Lidocaine,Donnatal)  30 mL Oral TID PRN [name], NP   30 mL at 07/21/16 0412  . hydrocerin (EUCERIN) cream   Topical BID Laverle Hobby, PA-C      . hydrOXYzine (ATARAX/VISTARIL) tablet 25 mg  25 mg Oral Q6H PRN Laverle Hobby, PA-C   25 mg at 07/21/16 0145  . lactose free nutrition (BOOST PLUS) liquid 237 mL  237 mL Oral TID WC Jenne Campus, MD   237 mL at 07/21/16 0627  . lipase/protease/amylase (CREON) capsule 24,000 Units  24,000 Units Oral TID AC [name], NP   24,000 Units at 07/21/16 [phone]  . loperamide (IMODIUM) capsule 2-4 mg  2-4 mg Oral PRN Laverle Hobby, PA-C      . loratadine (CLARITIN) tablet 10 mg  10 mg Oral Daily Laverle Hobby, PA-C   10 mg at 07/21/16 0815  . LORazepam (ATIVAN) tablet 1 mg   1 mg Oral Q6H PRN Laverle Hobby, PA-C      . LORazepam (ATIVAN) tablet 1 mg  1 mg Oral QID Laverle Hobby, PA-C   1 mg at 07/21/16 8416   Followed by  . [START ON 07/22/2016] LORazepam (ATIVAN) tablet 1 mg  1 mg Oral TID Laverle Hobby, PA-C       Followed by  . [START ON 07/23/2016] LORazepam (ATIVAN) tablet 1 mg  1 mg Oral BID Laverle Hobby, PA-C       Followed by  . [START ON 07/25/2016] LORazepam (ATIVAN) tablet 1 mg  1 mg Oral Daily Spencer E Simon, PA-C      . magnesium hydroxide (MILK OF MAGNESIA) suspension 30 mL  30 mL Oral Daily PRN [name], NP      . metoprolol tartrate (LOPRESSOR) tablet 25 mg  25 mg Oral BID [name], NP   25 mg at 07/21/16 6063  . multivitamin with minerals tablet 1 tablet  1 tablet Oral Daily Laverle Hobby, PA-C   1 tablet at 07/21/16 0160  . nicotine polacrilex (NICORETTE) gum 2 mg  2 mg Oral PRN Encarnacion Slates, NP   2 mg  at 07/21/16 0823  . ondansetron (ZOFRAN-ODT) disintegrating tablet 4 mg  4 mg Oral Q6H PRN Laverle Hobby, PA-C      . pantoprazole (PROTONIX) EC tablet 40 mg  40 mg Oral BID [name], NP   40 mg at 07/21/16 0816  . sertraline (ZOLOFT) tablet 75 mg  75 mg Oral Daily [name], NP   75 mg at 07/21/16 0817  . sucralfate (CARAFATE) tablet 1 g  1 g Oral TID WC & HS [name], NP   1 g at 07/21/16 1610  . thiamine (VITAMIN B-1) tablet 100 mg  100 mg Oral Daily Laverle Hobby, PA-C   100 mg at 07/21/16 9604   PTA Medications: Prescriptions Prior to Admission  Medication Sig Dispense Refill Last Dose  . albuterol (PROVENTIL HFA;VENTOLIN HFA) 108 (90 BASE) MCG/ACT inhaler Inhale 2 puffs into the lungs every 6 (six) hours as needed for wheezing or shortness of breath.    Past Month at Unknown time  . aluminum-magnesium hydroxide-simethicone (MAALOX) [phone] MG/5ML SUSP Take 30 mLs by mouth every 6 (six) hours as needed (for indigestion/heartburn).   07/19/2016 at Unknown time  . ARIPiprazole (ABILIFY) 5 MG tablet  Take 5 mg by mouth at bedtime.    07/19/2016 at Unknown time  . cholestyramine (QUESTRAN) 4 g packet Take 1 packet (4 g total) by mouth 3 (three) times daily as needed (diarrhea). 60 each 12 Past Month at Unknown time  . fluticasone (FLONASE) 50 MCG/ACT nasal spray Place 1 spray into both nostrils daily as needed for rhinitis.    07/19/2016 at Unknown time  . lipase/protease/amylase (CREON) 12000 UNITS CPEP capsule Take 2 capsules (24,000 Units total) by mouth 3 (three) times daily with meals. 270 capsule 3 Past Month at Unknown time  . metoprolol tartrate (LOPRESSOR) 25 MG tablet Take 1 tablet (25 mg total) by mouth 2 (two) times daily. 180 tablet 3 07/19/2016 at 0700  . ondansetron (ZOFRAN) 4 MG tablet Take 1 tablet (4 mg total) by mouth every 8 (eight) hours as needed for nausea or vomiting. 12 tablet 0 07/19/2016 at Unknown time  . pantoprazole (PROTONIX) 20 MG tablet Take 2 tablets (40 mg total) by mouth daily. 30 tablet 0 07/19/2016 at Unknown time  . sertraline (ZOLOFT) 25 MG tablet Take 75 mg by mouth daily.   07/19/2016 at Unknown time  . sildenafil (VIAGRA) 50 MG tablet Take 1 tablet (50 mg total) by mouth daily as needed for erectile dysfunction. 10 tablet 3 Past Month at Unknown time  . sucralfate (CARAFATE) 1 g tablet Take 1 tablet (1 g total) by mouth 4 (four) times daily -  with meals and at bedtime. 30 tablet 0 07/19/2016 at Unknown time    Patient Stressors: Health problems Loss of fiance miscarriage Substance abuse  Patient Strengths: Ability for insight Curator fund of knowledge Motivation for treatment/growth Supportive family/friends  Treatment Modalities: Medication Management, Group therapy, Case management,  1 to 1 session with clinician, Psychoeducation, Recreational therapy.   Physician Treatment Plan for Primary Diagnosis: Bipolar Disorder with Psychotic Features  Long Term Goal(s):     Short Term Goals:    Medication Management: Evaluate  patient's response, side effects, and tolerance of medication regimen.  Therapeutic Interventions: 1 to 1 sessions, Unit Group sessions and Medication administration.  Evaluation of Outcomes: Progressing  Physician Treatment Plan for Secondary Diagnosis: Active Problems:   Major depressive disorder, recurrent severe without psychotic features ([place])  Long  Term Goal(s):     Short Term Goals:       Medication Management: Evaluate patient's response, side effects, and tolerance of medication regimen.  Therapeutic Interventions: 1 to 1 sessions, Unit Group sessions and Medication administration.  Evaluation of Outcomes: Progressing   RN Treatment Plan for Primary Diagnosis: Bipolar Disorder with Psychotic Features  Long Term Goal(s): Knowledge of disease and therapeutic regimen to maintain health will improve  Short Term Goals: Ability to remain free from injury will improve, Ability to verbalize feelings will improve and Ability to disclose and discuss suicidal ideas  Medication Management: RN will administer medications as ordered by provider, will assess and evaluate patient's response and provide education to patient for prescribed medication. RN will report any adverse and/or side effects to prescribing provider.  Therapeutic Interventions: 1 on 1 counseling sessions, Psychoeducation, Medication administration, Evaluate responses to treatment, Monitor vital signs and CBGs as ordered, Perform/monitor CIWA, COWS, AIMS and Fall Risk screenings as ordered, Perform wound care treatments as ordered.  Evaluation of Outcomes: Progressing   LCSW Treatment Plan for Primary Diagnosis:Bipolar Disorder with Psychotic Features  Long Term Goal(s): Safe transition to appropriate next level of care at discharge, Engage patient in therapeutic group addressing interpersonal concerns.  Short Term Goals: Engage patient in aftercare planning with referrals and resources, Facilitate patient progression  through stages of change regarding substance use diagnoses and concerns and Identify triggers associated with mental health/substance abuse issues  Therapeutic Interventions: Assess for all discharge needs, 1 to 1 time with Social worker, Explore available resources and support systems, Assess for adequacy in community support network, Educate family and significant other(s) on suicide prevention, Complete Psychosocial Assessment, Interpersonal group therapy.  Evaluation of Outcomes: Progressing   Progress in Treatment: Attending groups: No. New to unit. Continuing to assess.  Participating in groups: No. Taking medication as prescribed: Yes. Toleration medication: Yes. Family/Significant other contact made: No, will contact:  family member/girlfriend if patient consents Patient understands diagnosis: Yes. Discussing patient identified problems/goals with staff: Yes. Medical problems stabilized or resolved: Yes. Denies suicidal/homicidal ideation: Yes. Issues/concerns per patient self-inventory: No. Other: n/a  New problem(s) identified: No, Describe:  n/a  New Short Term/Long Term Goal(s): detox; medication stabilization; development of comprehensive mental wellness/sobriety plan.   Discharge Plan or Barriers: CSW assessing for appropriate referrals. Pt has history at [facility] but has not been seen "in a long time." Patient identifies his girlfriend as a support person.   Reason for Continuation of Hospitalization: Aggression Depression Medication stabilization Withdrawal symptoms  Estimated Length of Stay: 3-5 days   Attendees: Patient: 07/21/2016 8:31 AM  Physician: Dr. Sanjuana Letters MD 07/21/2016 8:31 AM  Nursing:  07/21/2016 8:31 AM  RN Care Manager: Lars Pinks CM 07/21/2016 8:31 AM  Social Worker: Press photographer, LCSW; Matthew Saras [place] 07/21/2016 8:31 AM  Recreational Therapist: Rhunette Croft 07/21/2016 8:31 AM  Other:  07/21/2016 8:31 AM  Other:  07/21/2016 8:31 AM  Other: 07/21/2016 8:31  AM    Scribe for Treatment Team: [place], LCSW 07/21/2016 8:31 AM

## 2016-07-21 NOTE — Progress Notes (Signed)
D:Pt has been out in the dayroom interacting with peers. Pt was asking about prn medications as writer was giving scheduled medications. Pt requested prn GI medication around 0800 and had received at 0412. A:Explained that medication was ordered three times a day as needed and that medication should not be given close together. Offered support, encouragement and 15 minute checks  R:Pt acknowledges understanding. He denies si and hi. Safety maintained on the unit.

## 2016-07-21 NOTE — Progress Notes (Signed)
Pt is a new admit earlier in the day to the [facility] hall for detox and suicidal thoughts.  The first part of the shift, pt was focused on meds and getting his home meds re-ordered for his time on the unit.  Provider on call addressed these issues with the pt and orders were given.  Pt received an IM of Toradol 15 mg for his abd pain r/t his pancreatitis.  He has been appropriate with staff, but chose not to attend the evening group.  Pt denies SI/HI/AVH at this time.  He has been otherwise cooperative.  Support and encouragement offered.  Discharge plans are in process.  Safety maintained with q15 minute checks.
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
## 2016-07-21 NOTE — Plan of Care (Signed)
Problem: Education: Goal: Verbalization of understanding the information provided will improve Outcome: Progressing Medication frequency and benefits explained to pt and he acknowledges understanding.
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
## 2016-07-22 IMAGING — CR DG ABDOMEN ACUTE W/ 1V CHEST
3 series · 3 of 3 positions shown · non-contrast
Comparison: CT abdomen and pelvis 01/09/2014. Chest and two views
abdomen 12/09/2013.

CLINICAL DATA: Left lower quadrant and epigastric pain.
Pancreatitis. Nausea and vomiting.

EXAM:
ACUTE ABDOMEN SERIES (ABDOMEN 2 VIEW & CHEST 1 VIEW)

[w chest pa]
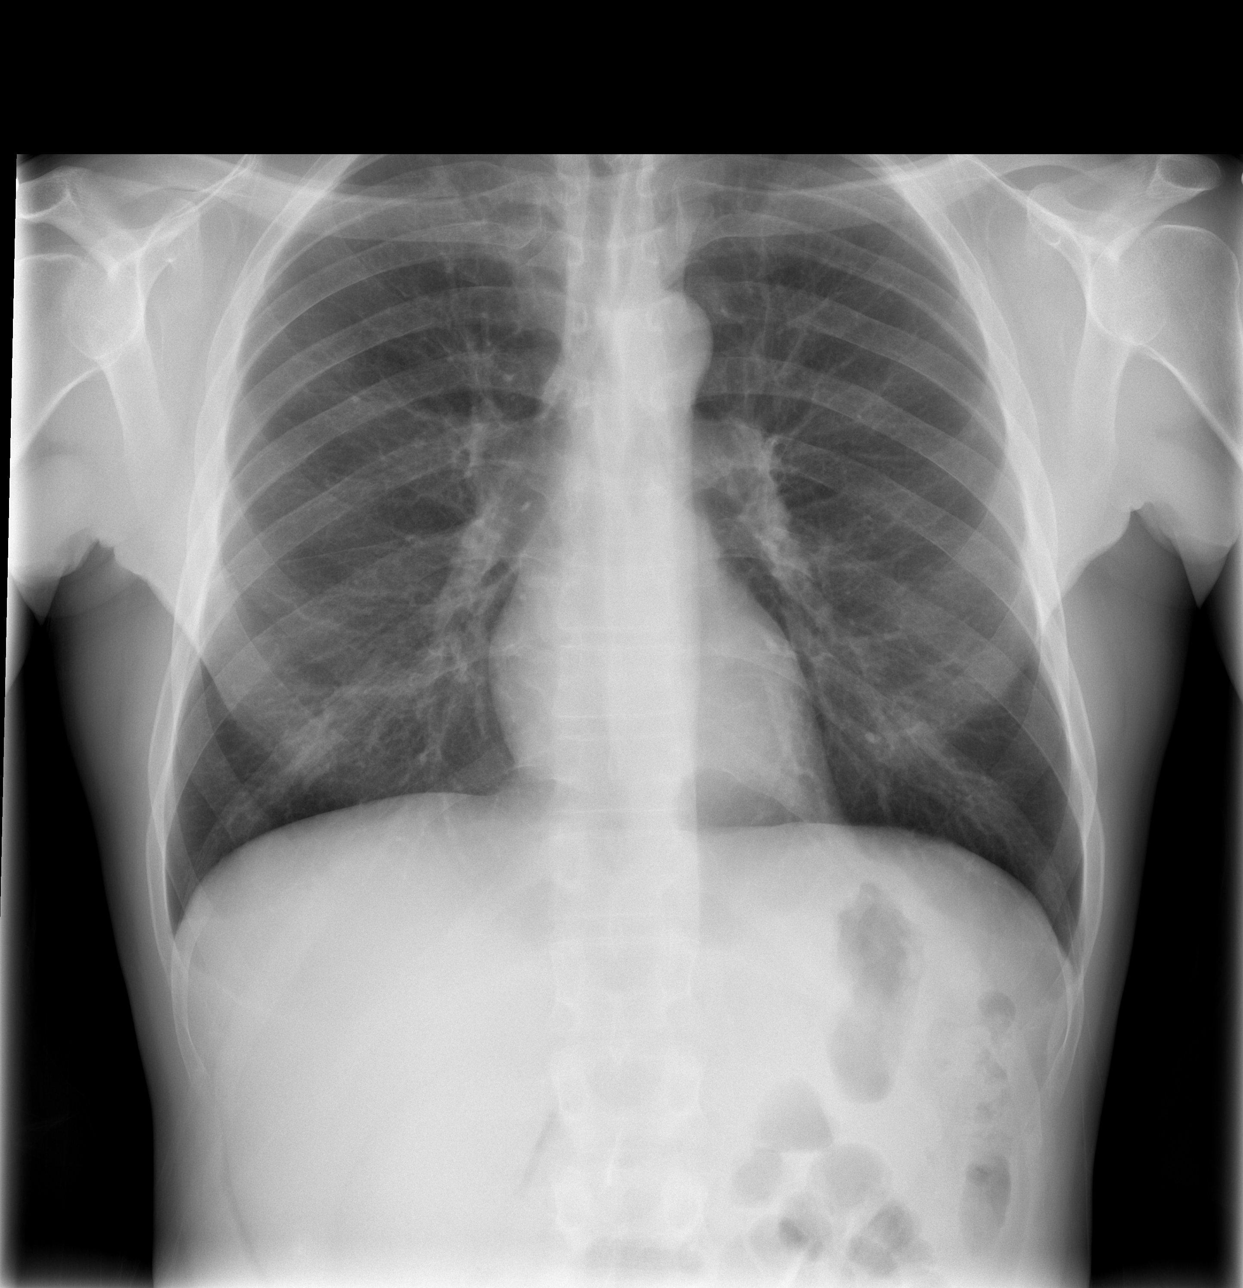

[w abdomen upright]
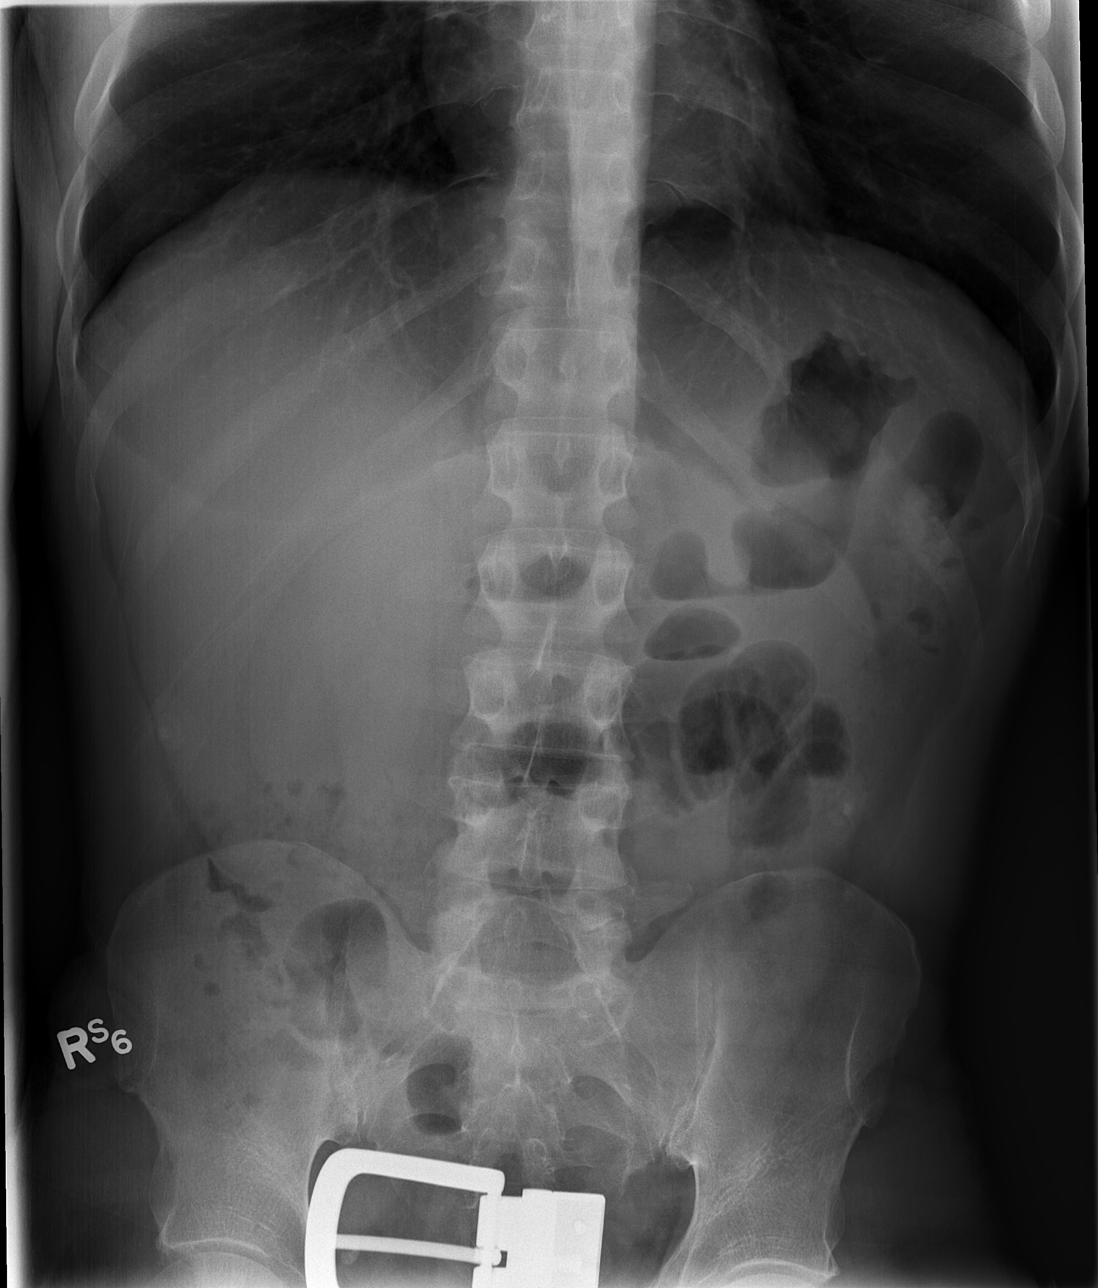

[t abdomen supine]
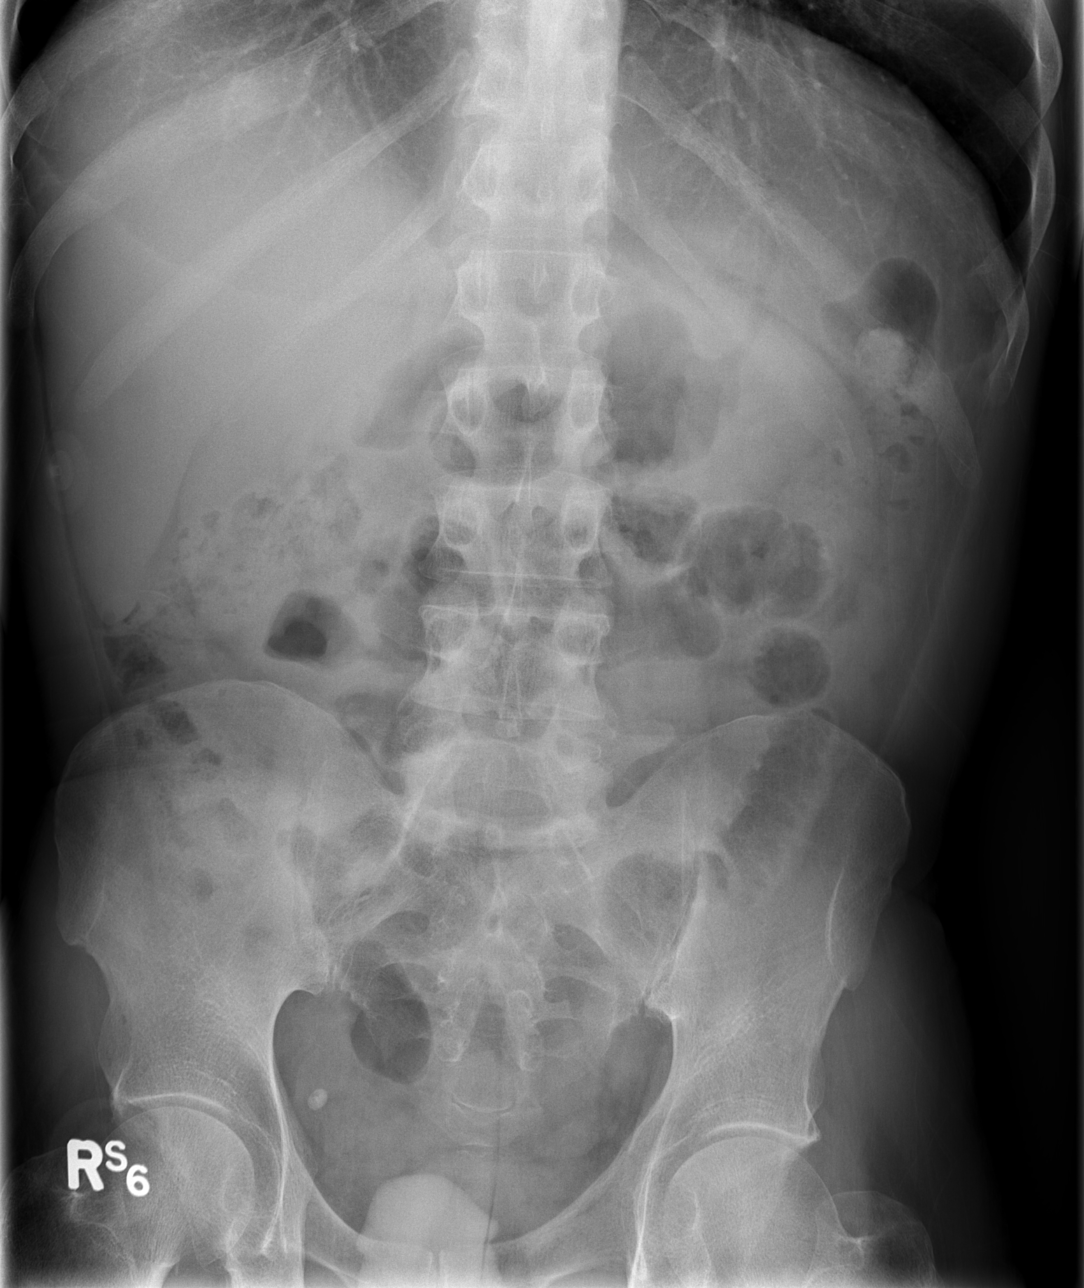

[3 of 3 positions shown; findings below may reference images not displayed]

FINDINGS: Single view of the chest demonstrates clear lungs and normal heart
size. No pneumothorax or pleural effusion.

Two views of the abdomen show no free intraperitoneal air. The bowel
gas pattern is unremarkable. No abnormal abdominal calcification or
focal bony abnormality is identified.
IMPRESSION: Negative examination.

## 2016-07-22 MED ORDER — HYDROXYZINE HCL 50 MG PO TABS
25.0000 mg | ORAL_TABLET | Freq: Four times a day (QID) | ORAL | Status: DC | PRN
  Administered 2016-07-22 – 2016-07-26 (×7): 25 mg via ORAL
  Filled 2016-07-22 (×3): qty 1
  Filled 2016-07-22: qty 10
  Filled 2016-07-22 (×6): qty 1

## 2016-07-22 NOTE — Progress Notes (Signed)
Recreation Therapy Notes  Date: 07/22/16 Time: 0930 Location: 300 Hall Dayroom  Group Topic: Stress Management  Goal Area(s) Addresses:  Patient will verbalize importance of using healthy stress management.  Patient will identify positive emotions associated with healthy stress management.   Behavioral Response: Engaged  Intervention: Stress Management  Activity :  Meditation.  LRT introduced the stress management technique of meditation to patients.  LRT played a meditation from the Calm app to allow patients to engage in the technique.  Patients were to follow along as the meditation played to engage.      Education:  Stress Management, Discharge Planning.   Education Outcome: Acknowledges edcuation/In group clarification offered/Needs additional education  Clinical Observations/Feedback: Pt attended group.   [name], LRT/CTRS         [name] A 07/22/2016 12:19 PM 

## 2016-07-22 NOTE — Progress Notes (Signed)
D: Pt at the time of assessment complained of moderate anxiety, depression and some HI thought against someone outside. Pt could be a little slow; asked same questions several times during a single interaction. Pt denied pain or AVH. Pt was flat and restless A: Medications offered as prescribed. All patient's questions and concerns addressed. Support, encouragement, and safe environment provided. 15-minute safety checks continue. R: Pt was med compliant.  Pt attended wrap-up group. Safety checks continue.

## 2016-07-22 NOTE — Progress Notes (Signed)
Nursing Note 07/22/2016 [id number]  Data Reports sleeping good with PRN sleep med.  Rates depression 0/10, hopelessness 0/10, and anxiety 1/10. Affect blunted/depressed.  Denies HI, SI, AVH.  Reports anxiety and chronic abdominal pain, states it is no worse than usual. Attending groups, appropriate with peers.  Multiple PRN's throughout day which were effective per MAR.  Action Spoke with patient 1:1, nurse offered support to patient throughout shift.  Continues to be monitored on 15 minute checks for safety.  Response Remains safe and appropriate on unit.  Chronic abdominal pain/nausea at baseline per pt.

## 2016-07-22 NOTE — Progress Notes (Signed)
Adult Psychoeducational Group Note  Date:  07/22/2016 Time:  1:52 PM  Group Topic/Focus:  Recovery Goals:   The focus of this group is to identify appropriate goals for recovery and establish a plan to achieve them.  Participation Level:  Active  Participation Quality:  Attentive  Affect:  Appropriate  Cognitive:  Alert  Insight: Good  Engagement in Group:  Engaged  Modes of Intervention:  Education  Additional Comments:  none  [place] 07/22/2016, 1:52 PM

## 2016-07-22 NOTE — B[facility] Group Notes (Signed)
[facility] LCSW Group Therapy  07/22/2016 10:35 AM  Type of Therapy:  Group Therapy  Participation Level:  Did Not Attend-pt invited. Chose to remain in bed.  Summary of Progress/Problems: Emotion Regulation: This group focused on both positive and negative emotion identification and allowed group members to process ways to identify feelings, regulate negative emotions, and find healthy ways to manage internal/external emotions. Group members were asked to reflect on a time when their reaction to an emotion led to a negative outcome and explored how alternative responses using emotion regulation would have benefited them. Group members were also asked to discuss a time when emotion regulation was utilized when a negative emotion was experienced.   Alif Petrak N Smart LCSW 07/22/2016, 10:35 AM

## 2016-07-22 NOTE — Progress Notes (Signed)
[facility] MD Progress Note  07/22/2016 2:55 PM [name]  MRN:  [id number] Subjective:   46 yo AAM, engaged, lives with his fiancee, employed as a Theme park manager. Background history of SUD (alcohol, cocaine and THC),  mood disorder and medical comorbidity as above. Presented to the ER initially for abdominal pain. Represented a few days later in company of his fiancee. Admitted for alcohol detoxification. No associated suicidal or homicidal thoughts. Significant labs at admission includes the following. UDS was positive for cocaine. BAL 98 mg/dl,  Lipase was normal, slightly reduced potassium and mildly elevated AST.  Nursing staff reports that he has been coming alcohol smoothly. He has attended some groups. Still has some irritability. No aggressive behavior.   At interview, patient reports poor sleep last night. Says he has been used to getting into bed with alcohol. He is still craving. No hallucinations since he has been here. No feeling as if anyone is out to harm him. No thoughts of suicide. No retching or vomiting. No abdominal pain. Says his fiancee came to visit him here. She has given him six months to get things right before they can get engaged. We explored inpatient rehab. Patient is passive and wants others to get him in somewhere. Says he has been at some in the past. I explored reports of homicide that was documented. Patient clarified that it was in the past. None currently  Principal Problem: Substance induced mood disorder ([place])                                    Alcohol Use Disorder Diagnosis:   Patient Active Problem List   Diagnosis Date Noted  . Substance induced mood disorder ([place]) [F19.94] 07/21/2016  . Major depressive disorder, recurrent severe without psychotic features ([place]) [F33.2] 07/20/2016  . Leukocytosis [D72.829]   . Hospital acquired PNA [J18.9] 05/20/2015  . Pancreatitis [K85.90] 05/18/2015  . Pseudocyst of pancreas [K86.3] 05/18/2015  . Malnutrition of  moderate degree [E44.0] 03/27/2015  . Abnormal transaminases [R74.8] 03/26/2015  . Polysubstance abuse (tobacco, cocaine, THC, and ETOH) [F19.10] 03/26/2015  . Acute pancreatitis [K85.90] 03/26/2015  . 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 Essential hypertension [I10] 02/06/2014  . Alcohol-induced acute pancreatitis [K85.20] 11/28/2013  . Pancreatic pseudocyst/cyst [A44.9, K86.3] 11/25/2013  . Alcohol dependence ([place]) [F10.20] 10/23/2013  . MDD (major depressive disorder), recurrent severe, without psychosis ([place]) [F33.2] 10/22/2013  . Protein-calorie malnutrition, severe ([place]) [E43] 10/10/2013  . Suicide attempt [T14.91XA] 10/08/2013  . Substance use disorder [F19.90] 10/06/2013  . 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 TOBACCO ABUSE [F17.200] 03/23/2007   Total Time spent with patient: 30 minutes  Past Psychiatric History: As in H&P  Past Medical History:  Past Medical History:  Diagnosis Date  . Alcoholism /alcohol abuse ([place])   . Anemia   . Anxiety   . Arthritis    "knees; arms; elbows" (03/26/2015)  . Asthma   . Bipolar disorder ([place])   . Chronic bronchitis ([place])   . Chronic lower back pain   . Cocaine abuse   . Depression   . Family history of adverse reaction to anesthesia    "grandmother gets confused"  . Femoral condyle fracture ([place]) 03/08/2014   left medial/notes 03/09/2014  . GERD (gastroesophageal reflux disease)   . H/O hiatal hernia   . H/O suicide attempt 10/2012  . Heart murmur    "  when he was little" (03/06/2013)  . High cholesterol   . History of blood transfusion 10/2012   "when I tried to commit suicide"  . History of stomach ulcers   . Hypertension   . Marijuana abuse, continuous   . Migraine    "a few times/year" (03/26/2015)  . Pancreatitis   . Pneumonia 1990's X 3  . PTSD (post-traumatic stress disorder)   . Shortness of breath    "can happen at anytime" (03/06/2013)  . Sickle cell  trait ([place])   . WPW (Wolff-Parkinson-White syndrome)    [name] 03/06/2013    Past Surgical History:  Procedure Laterality Date  . CARDIAC CATHETERIZATION    . EYE SURGERY Left 1990's   "result of trauma"   . FACIAL FRACTURE SURGERY Left 1990's   "result of trauma"   . FRACTURE SURGERY    . HERNIA REPAIR    . LEFT HEART CATHETERIZATION WITH CORONARY ANGIOGRAM Right 03/07/2013   Procedure: LEFT HEART CATHETERIZATION WITH CORONARY ANGIOGRAM;  Surgeon: [name], MD;  Location: [facility];  Service: Cardiovascular;  Laterality: Right;  . UMBILICAL HERNIA REPAIR     Family History:  Family History  Problem Relation Age of Onset  . Hypertension Other   . Coronary artery disease Other    Family Psychiatric  History: As in H&P Social History:  History  Alcohol Use  . 7.2 oz/week  . 12 Cans of beer per week     History  Drug Use  . Types: Marijuana, Cocaine    Social History   Social History  . Marital status: Single    Spouse name: N/A  . Number of children: N/A  . Years of education: N/A   Social History Main Topics  . Smoking status: Current Every Day Smoker    Packs/day: 1.50    Years: 30.00    Types: Cigarettes  . Smokeless tobacco: Current User    Types: Chew  . Alcohol use 7.2 oz/week    12 Cans of beer per week  . Drug use: Yes    Types: Marijuana, Cocaine  . Sexual activity: Yes    Birth control/ protection: None   Other Topics Concern  . None   Social History Narrative  . None   Additional Social History:        Sleep: Poor  Appetite:  Good  Current Medications: Current Facility-Administered Medications  Medication Dose Route Frequency Provider Last Rate Last Dose  . albuterol (PROVENTIL HFA;VENTOLIN HFA) 108 (90 Base) MCG/ACT inhaler 2 puff  2 puff Inhalation Q6H PRN Artist Beach, MD      . benzocaine (ORAJEL) 10 % mucosal gel   Mouth/Throat QID PRN Laverle Hobby, PA-C      . chlorhexidine (PERIDEX) 0.12 % solution 15 mL  15  mL Mouth/Throat QID Laverle Hobby, PA-C   15 mL at 07/22/16 0759  . cholestyramine light (PREVALITE) packet 4 g  4 g Oral TID PRN [name], NP      . famotidine (PEPCID) tablet 20 mg  20 mg Oral BID [name], NP   20 mg at 07/22/16 0759  . fluticasone (FLONASE) 50 MCG/ACT nasal spray 2 spray  2 spray Each Nare Daily Laverle Hobby, PA-C   2 spray at 07/22/16 0800  . gabapentin (NEURONTIN) capsule 200 mg  200 mg Oral TID [name], NP   200 mg at 07/22/16 1212  . gi cocktail (Maalox,Lidocaine,Donnatal)  30 mL Oral  TID PRN [name], NP   30 mL at 07/22/16 1300  . hydrocerin (EUCERIN) cream   Topical BID Laverle Hobby, PA-C   1 application at 72/53/66 0800  . hydrOXYzine (ATARAX/VISTARIL) tablet 25 mg  25 mg Oral Q6H PRN Artist Beach, MD   25 mg at 07/22/16 1043  . lactose free nutrition (BOOST PLUS) liquid 237 mL  237 mL Oral TID WC Myer Peer Cobos, MD   237 mL at 07/22/16 1214  . lipase/protease/amylase (CREON) capsule 24,000 Units  24,000 Units Oral TID AC [name], NP   24,000 Units at 07/22/16 1259  . loperamide (IMODIUM) capsule 2-4 mg  2-4 mg Oral PRN Laverle Hobby, PA-C      . loratadine (CLARITIN) tablet 10 mg  10 mg Oral Daily Laverle Hobby, PA-C   10 mg at 07/22/16 0800  . LORazepam (ATIVAN) tablet 1 mg  1 mg Oral Q6H PRN Laverle Hobby, PA-C   1 mg at 07/22/16 0056  . LORazepam (ATIVAN) tablet 1 mg  1 mg Oral TID Laverle Hobby, PA-C   1 mg at 07/22/16 1212   Followed by  . [START ON 07/23/2016] LORazepam (ATIVAN) tablet 1 mg  1 mg Oral BID Laverle Hobby, PA-C       Followed by  . [START ON 07/25/2016] LORazepam (ATIVAN) tablet 1 mg  1 mg Oral Daily Spencer E Simon, PA-C      . magnesium hydroxide (MILK OF MAGNESIA) suspension 30 mL  30 mL Oral Daily PRN [name], NP      . metoprolol tartrate (LOPRESSOR) tablet 25 mg  25 mg Oral BID [name], NP   25 mg at 07/22/16 0759  . multivitamin with minerals tablet 1 tablet  1 tablet  Oral Daily Laverle Hobby, PA-C   1 tablet at 07/22/16 0800  . naltrexone (DEPADE) tablet 50 mg  50 mg Oral Daily Artist Beach, MD   50 mg at 07/22/16 0800  . nicotine polacrilex (NICORETTE) gum 2 mg  2 mg Oral PRN Encarnacion Slates, NP   2 mg at 07/22/16 1044  . ondansetron (ZOFRAN-ODT) disintegrating tablet 4 mg  4 mg Oral Q6H PRN Laverle Hobby, PA-C   4 mg at 07/22/16 0757  . pantoprazole (PROTONIX) EC tablet 40 mg  40 mg Oral BID [name], NP   40 mg at 07/22/16 0800  . sertraline (ZOLOFT) tablet 75 mg  75 mg Oral Daily [name], NP   75 mg at 07/22/16 0759  . sucralfate (CARAFATE) tablet 1 g  1 g Oral TID WC & HS [name], NP   1 g at 07/22/16 1212  . thiamine (VITAMIN B-1) tablet 100 mg  100 mg Oral Daily Laverle Hobby, PA-C   100 mg at 07/22/16 4403    Lab Results:  Results for orders placed or performed during the hospital encounter of 07/20/16 (from the past 48 hour(s))  Lipase, blood     Status: None   Collection Time: 07/21/16  6:11 AM  Result Value Ref Range   Lipase 30 11 - 51 U/L    Comment: Performed at [facility], [place] [address], [place], [place] [address]    Blood Alcohol level:  Lab Results  Component Value Date   ETH 98 (H) 07/19/2016   ETH <5 95/63/8756    Metabolic Disorder Labs: Lab Results  Component Value Date  HGBA1C 4.8 02/23/2014   MPG 91 02/23/2014   MPG 82 10/07/2013   No results found for: PROLACTIN Lab Results  Component Value Date   CHOL 164 01/30/2014   TRIG 190 (H) 01/30/2014   HDL 74 01/30/2014   CHOLHDL 2.2 01/30/2014   VLDL 38 01/30/2014   LDLCALC 52 01/30/2014   LDLCALC 61 11/21/2013    Physical Findings: AIMS: Facial and Oral Movements Muscles of Facial Expression: None, normal Lips and Perioral Area: None, normal Jaw: None, normal Tongue: None, normal,Extremity Movements Upper (arms, wrists, hands, fingers): None, normal Lower (legs, knees, ankles, toes): None, normal, Trunk  Movements Neck, shoulders, hips: None, normal, Overall Severity Severity of abnormal movements (highest score from questions above): None, normal Incapacitation due to abnormal movements: None, normal Patient's awareness of abnormal movements (rate only patient's report): No Awareness, Dental Status Current problems with teeth and/or dentures?: No (recently had three teeth pulled) Does patient usually wear dentures?: No  CIWA:  CIWA-Ar Total: 11 COWS:  COWS Total Score: 4  Musculoskeletal: Strength & Muscle Tone: within normal limits Gait & Station: normal Patient leans: N/A  Psychiatric Specialty Exam: Physical Exam  Constitutional: He is oriented to person, place, and time. He appears well-developed and well-nourished.  HENT:  Head: Normocephalic and atraumatic.  Eyes: Conjunctivae and EOM are normal. Pupils are equal, round, and reactive to light.  Neck: Normal range of motion. Neck supple.  Cardiovascular: Normal rate, regular rhythm and normal heart sounds.   Respiratory: Effort normal and breath sounds normal.  GI: Soft. Bowel sounds are normal.  Musculoskeletal: Normal range of motion.  Neurological: He is alert and oriented to person, place, and time. He has normal reflexes.  Skin: Skin is warm and dry.  Psychiatric:  As above    ROS  Blood pressure (!) 135/95, pulse 87, temperature 98.1 F (36.7 C), temperature source Oral, resp. rate 16, height 5\' 8"  (1.727 m), weight 59.4 kg (131 lb), SpO2 100 %.Body mass index is 19.92 kg/m.  General Appearance:  Calm and cooperative. No tremor, not sweaty. Relates well and dose not appear internally distracted.   Eye Contact:  Good  Speech:  Clear and Coherent  Volume:  Normal  Mood:  mild underlying irritability.   Affect:  Restricted  Thought Process:  Linear  Orientation:  Full (Time, Place, and Person)  Thought Content:  No delusional theme. No preoccupation with violent thoughts. No negative ruminations. No obsession.  No  hallucination in any modality.   Suicidal Thoughts:  No  Homicidal Thoughts:  No  Memory:  Immediate;   Fair Recent;   Fair Remote;   Fair  Judgement:  Fair  Insight:  Limited  Psychomotor Activity:  Normal  Concentration:  Concentration: Fair and Attention Span: Fair  Recall:  [employer] of Knowledge:  Fair  Language:  Good  Akathisia:  No  Handed:    AIMS (if indicated):     Assets:  Communication Skills Desire for Improvement  ADL's:  Intact  Cognition:  WNL  Sleep:  Number of Hours: 4.75     Treatment Plan Summary:  Patient is coming off alcohol smoothly. No associated psychosis. He is contemplating inpatient rehab. We are still evaluating him.   Psychiatric: SUD (alcohol, cocaine and THC) Substance induced mood disorder  Medical: Pancreatitis GERD HTN WPW syndrome Asthma   Psychosocial:  Relationship issues  PLAN: 1. Continue current regimen 2. Continue to monitor mood, behavior and interaction with peers.    Laruth Bouchard  Anysia Choi, MD 07/22/2016, 2:55 PM

## 2016-07-22 NOTE — Progress Notes (Signed)
Met with pt to discuss [place] for Advance Directives.     Pt described wanting a "do not resuscitate"  Chaplain described Advance Directives as "[place]" and 'Living Will"   Provided education around each and explained difference between Adv. Dir. And DNR.   Explained that DNR is medical order that he would have to speak with physician about.    Patient asked questions about fiance benefiting from insurance payments.  Chaplain explained difference between [place].     Patient voiced understanding.    [place], [place]

## 2016-07-22 NOTE — Progress Notes (Signed)
Patient attended AA group meeting.  

## 2016-07-22 NOTE — [facility] Suicide Risk Assessment (Signed)
[place] INPATIENT:  Family/Significant Other Suicide Prevention Education  Suicide Prevention Education:  Contact Attempts: Dimitrious Micciche (pt's mother) [phone] has been identified by the patient as the family member/significant other with whom the patient will be residing, and identified as the person(s) who will aid the patient in the event of a mental health crisis.  With written consent from the patient, two attempts were made to provide suicide prevention education, prior to and/or following the patient's discharge.  We were unsuccessful in providing suicide prevention education.  A suicide education pamphlet was given to the patient to share with family/significant other.  Date and time of first attempt: 07/21/16 at 2:35PM (line busy/no answer) Date and time of second attempt: 07/22/16 (lines continues to ring busy).   Anntoinette Haefele N Smart LCSW 07/22/2016, 10:24 AM

## 2016-07-23 MED ORDER — LORAZEPAM 1 MG PO TABS
1.0000 mg | ORAL_TABLET | Freq: Once | ORAL | Status: AC
Start: 2016-07-23 — End: 2016-07-23
  Administered 2016-07-23: 1 mg via ORAL
  Filled 2016-07-23: qty 1

## 2016-07-23 NOTE — Progress Notes (Addendum)
Nursing Progress Note [id number]  D: Patient presents with flat affect and appears anxious. Patient attended group but was limited with appropriate engagement. Patient asks Probation officer "can you check my lipase levels?" and complains of abdominal pain. Patient requests medications for anxiety and pain. Patient observed by MHT attempting to go into a peer's room. Patient reeducated to not enter any other patient's room. Patient verbalized understanding. Patient denies SI/HI/AVH and contracts for safety on the unit.  A: Emotional support given. 1:1 active listening and support provided. Patient medicated with PM orders as prescribed. Medications reviewed with patient. Patient verbalized understanding with no issues or concerns. Snacks and fluids provided this evening. Opportunities to ask questions and review plan of care provided. Patient encouraged to work on treatment goals. Safety maintained with q15 minute level III safety checks. Patient provided with heat packs for his stomach.  R: Patient receptive to interactions with Probation officer. Patient remains safe on the unit at this time. Patient denies any adverse drug reactions. Patient is resting in bed without concerns.

## 2016-07-23 NOTE — Progress Notes (Addendum)
Patient ID: [name], male   DOB: 1970/05/22, 46 y.o.   MRN: [id number]      D: Pt has been very flat and depressed today on the unit. Pt got upset during group dur to another patient telling him that he was doing wrong by his girlfriend by drinking, then coming to get help like he was going to change when he knows that he is not. Pt started talking really loud and then stood up on the table in the middle of group. Several staff counseled with patient, he did not come down off of the table until he almost fail. Staff were present to make sure that patient did not fall. This Probation officer met with patient, he reported that he was very anxious and that he needed something to help. Pt was given prn dose of Ativan, and reported that it helped. Pt reported that his depression was a 6, his hopelessness was a 6, and his anxiety was a 9. Pt reported that his goal for today was to get a shot of Toradol, this Probation officer spoke doctor regarding patients request. He reported that patient was already on something for pain and that he would not prescribe Toradol. Pt made aware of doctors decision. Pt reported being negative SI/HI, no AH/VH noted. A: 15 min checks continued for patient safety. R: Pt safety maintained.    Pt was in a verbal altercation with another patient and was fixated on the fact that Obama did nothing for black people. Pt started communicating threats to another patient, and due to behavior he was moved to the 500 hall.

## 2016-07-23 NOTE — Progress Notes (Signed)
Adult Psychoeducational Group Note  Date:  07/23/2016 Time:  8:54 PM  Group Topic/Focus:  Wrap-Up Group:   The focus of this group is to help patients review their daily goal of treatment and discuss progress on daily workbooks.  Participation Level:  Active  Participation Quality:  Appropriate  Affect:  Appropriate  Cognitive:  Appropriate  Insight: Appropriate  Engagement in Group:  Engaged  Modes of Intervention:  Discussion  Additional Comments:  The patient expressed that he rates today a 8.The patient also said that attended groups.  Nash Shearer 07/23/2016, 8:54 PM

## 2016-07-23 NOTE — Plan of Care (Signed)
Problem: Safety: Goal: Periods of time without injury will increase Outcome: Progressing Patient remains safe on the unit. Patient contracts for safety and is on Level III q15 minute safety checks.

## 2016-07-23 NOTE — Progress Notes (Signed)
[facility] MD Progress Note  07/23/2016 6:04 PM Nehan Quinterious Walraven  MRN:  [id number] Subjective:   46 yo AAM, engaged, lives with his fiancee, employed as a Theme park manager. Background history of SUD (alcohol, cocaine and THC),  mood disorder and medical comorbidity as above. Presented to the ER initially for abdominal pain. Represented a few days later in company of his fiancee. Admitted for alcohol detoxification. No associated suicidal or homicidal thoughts. Significant labs at admission includes the following. UDS was positive for cocaine. BAL 98 mg/dl,  Lipase was normal, slightly reduced potassium and mildly elevated AST.  Nursing staff reports that he has been argumentative towards peers. He easily gets angry with peers and tends to get loud. He has not been observed to hallucinate. He has not been observed to be in pain. He has been trying to get narcotics. He displaces his frustration towards others.  Seen today. Not really processing his main issues. Not invested in rehab. Says he does not have insurance. He cannot pay for rehab.  No suicidal thoughts. No homicidal thoughts. No evidence of psychosis. No evidence of mania. Patient is craving for substances.  Principal Problem: Substance induced mood disorder ([place])                                    Alcohol Use Disorder Diagnosis:   Patient Active Problem List   Diagnosis Date Noted  . Substance induced mood disorder ([place]) [F19.94] 07/21/2016  . Major depressive disorder, recurrent severe without psychotic features ([place]) [F33.2] 07/20/2016  . Leukocytosis [D72.829]   . Hospital acquired PNA [J18.9] 05/20/2015  . Pancreatitis [K85.90] 05/18/2015  . Pseudocyst of pancreas [K86.3] 05/18/2015  . Malnutrition of moderate degree [E44.0] 03/27/2015  . Abnormal transaminases [R74.8] 03/26/2015  . Polysubstance abuse (tobacco, cocaine, THC, and ETOH) [F19.10] 03/26/2015  . Acute pancreatitis [K85.90] 03/26/2015  . Alcohol-induced chronic pancreatitis  ([place]) [K86.0]   . Essential hypertension [I10] 02/06/2014  . Alcohol-induced acute pancreatitis [K85.20] 11/28/2013  . Pancreatic pseudocyst/cyst [W09.8, K86.3] 11/25/2013  . Alcohol dependence ([place]) [F10.20] 10/23/2013  . MDD (major depressive disorder), recurrent severe, without psychosis ([place]) [F33.2] 10/22/2013  . Protein-calorie malnutrition, severe ([place]) [E43] 10/10/2013  . Suicide attempt [T14.91XA] 10/08/2013  . Substance use disorder [F19.90] 10/06/2013  . 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 TOBACCO ABUSE [F17.200] 03/23/2007   Total Time spent with patient: 30 minutes  Past Psychiatric History: As in H&P  Past Medical History:  Past Medical History:  Diagnosis Date  . Alcoholism /alcohol abuse ([place])   . Anemia   . Anxiety   . Arthritis    "knees; arms; elbows" (03/26/2015)  . Asthma   . Bipolar disorder ([place])   . Chronic bronchitis ([place])   . Chronic lower back pain   . Cocaine abuse   . Depression   . Family history of adverse reaction to anesthesia    "grandmother gets confused"  . Femoral condyle fracture ([place]) 03/08/2014   left medial/notes 03/09/2014  . GERD (gastroesophageal reflux disease)   . H/O hiatal hernia   . H/O suicide attempt 10/2012  . Heart murmur    "when he was little" (03/06/2013)  . High cholesterol   . History of blood transfusion 10/2012   "when I tried to commit suicide"  . History of stomach ulcers   . Hypertension   . Marijuana abuse, continuous   . Migraine    "  a few times/year" (03/26/2015)  . Pancreatitis   . Pneumonia 1990's X 3  . PTSD (post-traumatic stress disorder)   . Shortness of breath    "can happen at anytime" (03/06/2013)  . Sickle cell trait ([place])   . WPW (Wolff-Parkinson-White syndrome)    [name] 03/06/2013    Past Surgical History:  Procedure Laterality Date  . CARDIAC CATHETERIZATION    . EYE SURGERY Left 1990's   "result of trauma"   . FACIAL FRACTURE SURGERY Left  1990's   "result of trauma"   . FRACTURE SURGERY    . HERNIA REPAIR    . LEFT HEART CATHETERIZATION WITH CORONARY ANGIOGRAM Right 03/07/2013   Procedure: LEFT HEART CATHETERIZATION WITH CORONARY ANGIOGRAM;  Surgeon: [name], MD;  Location: [facility] CATH LAB;  Service: Cardiovascular;  Laterality: Right;  . UMBILICAL HERNIA REPAIR     Family History:  Family History  Problem Relation Age of Onset  . Hypertension Other   . Coronary artery disease Other    Family Psychiatric  History: As in H&P Social History:  History  Alcohol Use  . 7.2 oz/week  . 12 Cans of beer per week     History  Drug Use  . Types: Marijuana, Cocaine    Social History   Social History  . Marital status: Single    Spouse name: N/A  . Number of children: N/A  . Years of education: N/A   Social History Main Topics  . Smoking status: Current Every Day Smoker    Packs/day: 1.50    Years: 30.00    Types: Cigarettes  . Smokeless tobacco: Current User    Types: Chew  . Alcohol use 7.2 oz/week    12 Cans of beer per week  . Drug use: Yes    Types: Marijuana, Cocaine  . Sexual activity: Yes    Birth control/ protection: None   Other Topics Concern  . None   Social History Narrative  . None   Additional Social History:        Sleep: Poor  Appetite:  Good  Current Medications: Current Facility-Administered Medications  Medication Dose Route Frequency Provider Last Rate Last Dose  . albuterol (PROVENTIL HFA;VENTOLIN HFA) 108 (90 Base) MCG/ACT inhaler 2 puff  2 puff Inhalation Q6H PRN Artist Beach, MD   2 puff at 07/23/16 0817  . benzocaine (ORAJEL) 10 % mucosal gel   Mouth/Throat QID PRN Laverle Hobby, PA-C      . chlorhexidine (PERIDEX) 0.12 % solution 15 mL  15 mL Mouth/Throat QID Laverle Hobby, PA-C   15 mL at 07/23/16 1622  . cholestyramine light (PREVALITE) packet 4 g  4 g Oral TID PRN [name], NP      . famotidine (PEPCID) tablet 20 mg  20 mg Oral BID [name],  NP   20 mg at 07/23/16 1624  . fluticasone (FLONASE) 50 MCG/ACT nasal spray 2 spray  2 spray Each Nare Daily Laverle Hobby, PA-C   2 spray at 07/23/16 0810  . gabapentin (NEURONTIN) capsule 200 mg  200 mg Oral TID [name], NP   200 mg at 07/23/16 1624  . gi cocktail (Maalox,Lidocaine,Donnatal)  30 mL Oral TID PRN [name], NP   30 mL at 07/23/16 1630  . hydrocerin (EUCERIN) cream   Topical BID Laverle Hobby, PA-C      . hydrOXYzine (ATARAX/VISTARIL) tablet 25 mg  25 mg Oral Q6H PRN Laruth Bouchard  Izediuno, MD   25 mg at 07/22/16 2117  . lactose free nutrition (BOOST PLUS) liquid 237 mL  237 mL Oral TID WC Myer Peer Cobos, MD   237 mL at 07/23/16 1142  . lipase/protease/amylase (CREON) capsule 24,000 Units  24,000 Units Oral TID AC [name], NP   24,000 Units at 07/23/16 1625  . loperamide (IMODIUM) capsule 2-4 mg  2-4 mg Oral PRN Laverle Hobby, PA-C      . loratadine (CLARITIN) tablet 10 mg  10 mg Oral Daily Laverle Hobby, PA-C   10 mg at 07/23/16 0813  . LORazepam (ATIVAN) tablet 1 mg  1 mg Oral Q6H PRN Laverle Hobby, PA-C   1 mg at 07/23/16 1115  . LORazepam (ATIVAN) tablet 1 mg  1 mg Oral BID Laverle Hobby, PA-C   1 mg at 07/23/16 1628   Followed by  . [START ON 07/25/2016] LORazepam (ATIVAN) tablet 1 mg  1 mg Oral Daily Spencer E Simon, PA-C      . magnesium hydroxide (MILK OF MAGNESIA) suspension 30 mL  30 mL Oral Daily PRN [name], NP      . metoprolol tartrate (LOPRESSOR) tablet 25 mg  25 mg Oral BID [name], NP   25 mg at 07/23/16 1624  . multivitamin with minerals tablet 1 tablet  1 tablet Oral Daily Laverle Hobby, PA-C   1 tablet at 07/23/16 8032  . naltrexone (DEPADE) tablet 50 mg  50 mg Oral Daily Artist Beach, MD   50 mg at 07/23/16 0813  . nicotine polacrilex (NICORETTE) gum 2 mg  2 mg Oral PRN Encarnacion Slates, NP   2 mg at 07/23/16 1224  . ondansetron (ZOFRAN-ODT) disintegrating tablet 4 mg  4 mg Oral Q6H PRN Laverle Hobby, PA-C   4 mg  at 07/22/16 0757  . pantoprazole (PROTONIX) EC tablet 40 mg  40 mg Oral BID [name], NP   40 mg at 07/23/16 1625  . sertraline (ZOLOFT) tablet 75 mg  75 mg Oral Daily [name], NP   75 mg at 07/23/16 0813  . sucralfate (CARAFATE) tablet 1 g  1 g Oral TID WC & HS [name], NP   1 g at 07/23/16 1626  . thiamine (VITAMIN B-1) tablet 100 mg  100 mg Oral Daily Laverle Hobby, PA-C   100 mg at 07/23/16 8250    Lab Results:  No results found for this or any previous visit (from the past 48 hour(s)).  Blood Alcohol level:  Lab Results  Component Value Date   ETH 98 (H) 07/19/2016   ETH <5 03/70/4888    Metabolic Disorder Labs: Lab Results  Component Value Date   HGBA1C 4.8 02/23/2014   MPG 91 02/23/2014   MPG 82 10/07/2013   No results found for: PROLACTIN Lab Results  Component Value Date   CHOL 164 01/30/2014   TRIG 190 (H) 01/30/2014   HDL 74 01/30/2014   CHOLHDL 2.2 01/30/2014   VLDL 38 01/30/2014   LDLCALC 52 01/30/2014   LDLCALC 61 11/21/2013    Physical Findings: AIMS: Facial and Oral Movements Muscles of Facial Expression: None, normal Lips and Perioral Area: None, normal Jaw: None, normal Tongue: None, normal,Extremity Movements Upper (arms, wrists, hands, fingers): None, normal Lower (legs, knees, ankles, toes): None, normal, Trunk Movements Neck, shoulders, hips: None, normal, Overall Severity Severity of abnormal movements (highest score from questions above): None, normal  Incapacitation due to abnormal movements: None, normal Patient's awareness of abnormal movements (rate only patient's report): No Awareness, Dental Status Current problems with teeth and/or dentures?: No (recently had three teeth pulled) Does patient usually wear dentures?: No  CIWA:  CIWA-Ar Total: 11 COWS:  COWS Total Score: 4  Musculoskeletal: Strength & Muscle Tone: within normal limits Gait & Station: normal Patient leans: N/A  Psychiatric Specialty  Exam: Physical Exam  Constitutional: He is oriented to person, place, and time. He appears well-developed and well-nourished.  HENT:  Head: Normocephalic and atraumatic.  Eyes: Conjunctivae and EOM are normal. Pupils are equal, round, and reactive to light.  Neck: Normal range of motion. Neck supple.  Cardiovascular: Normal rate, regular rhythm and normal heart sounds.   Respiratory: Effort normal and breath sounds normal.  GI: Soft. Bowel sounds are normal.  Musculoskeletal: Normal range of motion.  Neurological: He is alert and oriented to person, place, and time. He has normal reflexes.  Skin: Skin is warm and dry.  Psychiatric:  As above    ROS  Blood pressure (!) 150/97, pulse 77, temperature 98.4 F (36.9 C), temperature source Oral, resp. rate 18, height 5\' 8"  (1.727 m), weight 59.4 kg (131 lb), SpO2 100 %.Body mass index is 19.92 kg/m.  General Appearance:  Not internally distracted. Moderate rapport. Navigates away from dealing with his addiction.   Eye Contact:  Good  Speech:  Clear and Coherent  Volume:  Normal  Mood:  Irritable   Affect:  Restricted  Thought Process:  Linear  Orientation:  Full (Time, Place, and Person)  Thought Content:  No delusional theme. No preoccupation with violent thoughts. No negative ruminations. No obsession.  No hallucination in any modality.   Suicidal Thoughts:  No  Homicidal Thoughts:  No  Memory:  Immediate;   Fair Recent;   Fair Remote;   Fair  Judgement:  Fair  Insight:  Limited  Psychomotor Activity:  Normal  Concentration:  Concentration: Fair and Attention Span: Fair  Recall:  [employer] of Knowledge:  Fair  Language:  Good  Akathisia:  No  Handed:    AIMS (if indicated):     Assets:  Communication Skills Desire for Improvement  ADL's:  Intact  Cognition:  WNL  Sleep:  Number of Hours: 6     Treatment Plan Summary:  Patient is coming off multiple substances. He is still irritable. No associated psychosis. We are  still evaluating him.   Psychiatric: SUD (alcohol, cocaine and THC) Substance induced mood disorder  Medical: Pancreatitis GERD HTN WPW syndrome Asthma   Psychosocial:  Relationship issues  PLAN: 1. Continue current regimen 2. Continue to monitor mood, behavior and interaction with peers.    Artist Beach, MD 07/23/2016, 6:04 PMPatient ID: Elmer Picker, male   DOB: 07/02/1970, 46 y.o.   MRN: [id number]

## 2016-07-24 ENCOUNTER — Other Ambulatory Visit: Payer: Self-pay

## 2016-07-24 MED ORDER — HALOPERIDOL 5 MG PO TABS
5.0000 mg | ORAL_TABLET | Freq: Once | ORAL | Status: AC
  Administered 2016-07-24: 5 mg via ORAL
  Filled 2016-07-24 (×2): qty 1

## 2016-07-24 MED ORDER — ONDANSETRON 4 MG PO TBDP
4.0000 mg | ORAL_TABLET | Freq: Three times a day (TID) | ORAL | Status: DC | PRN
  Administered 2016-07-24 – 2016-07-25 (×3): 4 mg via ORAL
  Filled 2016-07-24: qty 1
  Filled 2016-07-24: qty 5
  Filled 2016-07-24 (×3): qty 1

## 2016-07-24 MED ORDER — HALOPERIDOL 5 MG PO TABS
5.0000 mg | ORAL_TABLET | Freq: Two times a day (BID) | ORAL | Status: DC
  Administered 2016-07-24 – 2016-07-25 (×3): 5 mg via ORAL
  Filled 2016-07-24: qty 28
  Filled 2016-07-24 (×4): qty 1
  Filled 2016-07-24: qty 28
  Filled 2016-07-24 (×2): qty 1

## 2016-07-24 MED ORDER — GABAPENTIN 300 MG PO CAPS
300.0000 mg | ORAL_CAPSULE | Freq: Three times a day (TID) | ORAL | Status: DC
  Administered 2016-07-24 – 2016-07-25 (×5): 300 mg via ORAL
  Filled 2016-07-24: qty 1
  Filled 2016-07-24: qty 42
  Filled 2016-07-24: qty 1
  Filled 2016-07-24: qty 42
  Filled 2016-07-24 (×7): qty 1
  Filled 2016-07-24: qty 42

## 2016-07-24 NOTE — Progress Notes (Signed)
Writer spoke with patient 1:1 and he reports that he feels that he needs to stay a couple of more days and get help with his substance abuse. He requested his medications early. His fiance visited him tonight. He has been up in the dayroom interacting appropriately with peers. He has been up to the nursing station a few times to talk about his current situation with his fiance but has been pleasant. Support given and safety maintained on unit with 15 min checks.
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
## 2016-07-24 NOTE — [facility] Group Notes (Signed)
[facility] Group Notes:  (Clinical Social Work)  07/24/2016  11:00AM-12:00PM  Summary of Progress/Problems:  The main focus of today's process group was to listen to a variety of genres of music and to identify that different types of music provoke different responses.  The patient then was able to identify personally what was soothing for them, as well as energizing, as well as how patient can personally use this knowledge in sleep habits, with depression, and with other symptoms.  The patient expressed at the beginning of group the overall feeling of being in physical pain at a level of 6 out of 10.  At the end of group he said most of the pain was gone, and it was at a 2 out of 10.  He spoke up frequently, appropriately, and with insight about how various songs invoked feelings.  Type of Therapy:  Music Therapy   Participation Level:  Active  Participation Quality:  Attentive and Sharing  Affect:  Blunted  Cognitive:  Oriented  Insight:  Engaged  Engagement in Therapy:  Engaged  Modes of Intervention:   Activity, Exploration  Selmer Dominion, LCSW 07/24/2016

## 2016-07-24 NOTE — Progress Notes (Signed)
Adult Psychoeducational Group Note  Date:  07/24/2016 Time:  9:09 PM  Group Topic/Focus:  Wrap-Up Group:   The focus of this group is to help patients review their daily goal of treatment and discuss progress on daily workbooks.  Participation Level:  Active  Participation Quality:  Sharing  Affect:  Anxious  Cognitive:  Alert  Insight: Limited  Engagement in Group:  Limited  Modes of Intervention:  Discussion and Support  Additional Comments:  Patient states his goal is to "live clean". Patient states his day was a "10/10".  [name] 07/24/2016, 9:09 PM

## 2016-07-24 NOTE — Progress Notes (Signed)
Patient ID: [name], male   DOB: [birth year], 46 y.o.   MRN: [id number]   D: Pt has been very flat and depressed today on the unit. He has also continued to moments of getting upset over little things, where staff has had to redirect him to focus on himself. Pt went to lunch and had to be brought back to the unit as he got into another verbal altercation with once again another patient. May NP was made aware, patient was placed on a UR (unit restriction) and given Haldol. The Haldol did help the patient to calm down. A EKG was also ordered, results were given to Margarita Grizzle NP a cardiology consult was ordered. Pt reported that his depression was a 6, his hopelessness was a 2, and his anxiety was a 7. Pt reported that his goal for today was to clear his mind and body of stress. Pt reported being negative SI/HI, no AH/VH noted. A: 15 min checks continued for patient safety. R: Pt safety maintained.

## 2016-07-25 DIAGNOSIS — F1490 Cocaine use, unspecified, uncomplicated: Secondary | ICD-10-CM

## 2016-07-25 DIAGNOSIS — I10 Essential (primary) hypertension: Secondary | ICD-10-CM

## 2016-07-25 DIAGNOSIS — F1290 Cannabis use, unspecified, uncomplicated: Secondary | ICD-10-CM

## 2016-07-25 DIAGNOSIS — F1420 Cocaine dependence, uncomplicated: Secondary | ICD-10-CM | POA: Diagnosis present

## 2016-07-25 DIAGNOSIS — Z79899 Other long term (current) drug therapy: Secondary | ICD-10-CM

## 2016-07-25 DIAGNOSIS — F332 Major depressive disorder, recurrent severe without psychotic features: Secondary | ICD-10-CM

## 2016-07-25 DIAGNOSIS — F1020 Alcohol dependence, uncomplicated: Secondary | ICD-10-CM | POA: Diagnosis present

## 2016-07-25 DIAGNOSIS — F1994 Other psychoactive substance use, unspecified with psychoactive substance-induced mood disorder: Principal | ICD-10-CM

## 2016-07-25 DIAGNOSIS — F17210 Nicotine dependence, cigarettes, uncomplicated: Secondary | ICD-10-CM

## 2016-07-25 LAB — LIPID PANEL
CHOL/HDL RATIO: 1.9 ratio
CHOLESTEROL: 231 mg/dL — AB (ref 0–200)
HDL: 124 mg/dL (ref >40)
LDL Cholesterol: 90 mg/dL (ref 0–99)
Triglycerides: 86 mg/dL (ref <150)
VLDL: 17 mg/dL (ref 0–40)

## 2016-07-25 LAB — TSH: TSH: 1.566 u[IU]/mL (ref 0.350–4.500)

## 2016-07-25 MED ORDER — FAMOTIDINE 20 MG PO TABS: 20.0000 mg | ORAL_TABLET | Freq: Two times a day (BID) | ORAL | 0 refills | Status: DC

## 2016-07-25 MED ORDER — NALTREXONE HCL 50 MG PO TABS: 50.0000 mg | ORAL_TABLET | Freq: Every day | ORAL | 0 refills | Status: DC

## 2016-07-25 MED ORDER — IBUPROFEN 800 MG PO TABS
800.0000 mg | ORAL_TABLET | Freq: Four times a day (QID) | ORAL | Status: DC | PRN
  Administered 2016-07-25: 800 mg via ORAL
  Filled 2016-07-25: qty 1

## 2016-07-25 MED ORDER — PANCRELIPASE (LIP-PROT-AMYL) 12000-38000 UNITS PO CPEP: 24000.0000 [IU] | ORAL_CAPSULE | Freq: Three times a day (TID) | ORAL | 0 refills | Status: DC

## 2016-07-25 MED ORDER — FLUTICASONE PROPIONATE 50 MCG/ACT NA SUSP: 1.0000 | Freq: Every day | NASAL | 2 refills | Status: DC | PRN

## 2016-07-25 MED ORDER — ALBUTEROL SULFATE HFA 108 (90 BASE) MCG/ACT IN AERS: 2.0000 | INHALATION_SPRAY | Freq: Four times a day (QID) | RESPIRATORY_TRACT | Status: DC | PRN

## 2016-07-25 MED ORDER — HYDROXYZINE HCL 25 MG PO TABS: 25.0000 mg | ORAL_TABLET | Freq: Four times a day (QID) | ORAL | 0 refills | Status: DC | PRN

## 2016-07-25 MED ORDER — LORATADINE 10 MG PO TABS
10.0000 mg | ORAL_TABLET | Freq: Every day | ORAL | 0 refills | Status: DC
Start: 2016-07-26 — End: 2017-08-29

## 2016-07-25 MED ORDER — SUCRALFATE 1 G PO TABS: 1.0000 g | ORAL_TABLET | Freq: Three times a day (TID) | ORAL | 0 refills | Status: DC

## 2016-07-25 MED ORDER — METOPROLOL TARTRATE 25 MG PO TABS: 25.0000 mg | ORAL_TABLET | Freq: Two times a day (BID) | ORAL | 0 refills | Status: DC

## 2016-07-25 MED ORDER — HYDROCERIN EX CREA: 1.0000 "application " | TOPICAL_CREAM | Freq: Two times a day (BID) | CUTANEOUS | 0 refills | Status: DC

## 2016-07-25 MED ORDER — GABAPENTIN 300 MG PO CAPS: 300.0000 mg | ORAL_CAPSULE | Freq: Three times a day (TID) | ORAL | 0 refills | Status: DC

## 2016-07-25 MED ORDER — PANTOPRAZOLE SODIUM 20 MG PO TBEC: 40.0000 mg | DELAYED_RELEASE_TABLET | Freq: Every day | ORAL | 0 refills | Status: DC

## 2016-07-25 MED ORDER — HALOPERIDOL 5 MG PO TABS: 5.0000 mg | ORAL_TABLET | Freq: Two times a day (BID) | ORAL | 0 refills | Status: DC

## 2016-07-25 MED ORDER — HYDROXYZINE HCL 50 MG PO TABS
50.0000 mg | ORAL_TABLET | Freq: Once | ORAL | Status: AC
  Administered 2016-07-25: 50 mg via ORAL

## 2016-07-25 MED ORDER — ONDANSETRON HCL 4 MG PO TABS: 4.0000 mg | ORAL_TABLET | Freq: Three times a day (TID) | ORAL | 0 refills | Status: DC | PRN

## 2016-07-25 MED ORDER — CHOLESTYRAMINE 4 G PO PACK: 4.0000 g | PACK | Freq: Three times a day (TID) | ORAL | 12 refills | Status: DC | PRN

## 2016-07-25 MED ORDER — NICOTINE POLACRILEX 2 MG MT GUM: 2.0000 mg | CHEWING_GUM | OROMUCOSAL | 0 refills | Status: DC | PRN

## 2016-07-25 MED ORDER — BENZOCAINE 10 % MT GEL: Freq: Four times a day (QID) | OROMUCOSAL | 0 refills | Status: DC | PRN

## 2016-07-25 MED ORDER — SERTRALINE HCL 25 MG PO TABS: 75.0000 mg | ORAL_TABLET | Freq: Every day | ORAL | 0 refills | Status: DC

## 2016-07-25 MED ORDER — LORATADINE 10 MG PO TABS: 10.0000 mg | ORAL_TABLET | Freq: Every day | ORAL | Status: DC

## 2016-07-25 MED ORDER — CHOLESTYRAMINE 4 G PO PACK: 4.0000 g | PACK | Freq: Three times a day (TID) | ORAL | 0 refills | Status: DC | PRN

## 2016-07-25 NOTE — Progress Notes (Signed)
[facility] MD Progress Note  07/25/2016 3:10 PM [name]  MRN:  [id number] Subjective: Patient states " I have abdominal pain, I think I need Toradol. I am ok otherwise. [facility]."  Objective:Patient seen and chart reviewed.Discussed patient with treatment team.  Patient today seen visible in milieu. Pt initially reported to writer that he has abdominal pain. However per review of EHR , pt was seen in ED multiple times for the same and was cleared . Pt per Dr.Izideuno can be medication seeking and hence should be cautious about giving opiates. Discussed with pt alternative pain management. Pt agreed. Pt soon after meeting writer , when he appeared to be in a lot of pain , was seen 5 minutes later smiling and having a good time in the hallway. Its likely pt is exaggerating his pain inorder to get pain medications .        Principal Problem: Major depressive disorder, recurrent severe without psychotic features ([place])                                     Diagnosis:   Patient Active Problem List   Diagnosis Date Noted  . Alcohol use disorder, severe, dependence ([place]) [F10.20] 07/25/2016  . Cocaine use disorder, severe, dependence ([place]) [F14.20] 07/25/2016  . Major depressive disorder, recurrent severe without psychotic features ([place]) [F33.2] 07/20/2016  . Leukocytosis [D72.829]   . Hospital acquired PNA [J18.9] 05/20/2015  . Pancreatitis [K85.90] 05/18/2015  . Pseudocyst of pancreas [K86.3] 05/18/2015  . Malnutrition of moderate degree [E44.0] 03/27/2015  . Abnormal transaminases [R74.8] 03/26/2015  . Polysubstance abuse (tobacco, cocaine, THC, and ETOH) [F19.10] 03/26/2015  . Acute pancreatitis [K85.90] 03/26/2015  . Alcohol-induced chronic pancreatitis ([place]) [K86.0]   . Essential hypertension [I10] 02/06/2014  . Alcohol-induced acute pancreatitis [K85.20] 11/28/2013  . Pancreatic pseudocyst/cyst [S56.8, K86.3] 11/25/2013  . Alcohol dependence ([place])  [F10.20] 10/23/2013  . MDD (major depressive disorder), recurrent severe, without psychosis ([place]) [F33.2] 10/22/2013  . Protein-calorie malnutrition, severe ([place]) [E43] 10/10/2013  . Suicide attempt [T14.91XA] 10/08/2013  . 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 TOBACCO ABUSE [F17.200] 03/23/2007   Total Time spent with patient: 20 minutes  Past Psychiatric History: As in H&P  Past Medical History:  Past Medical History:  Diagnosis Date  . Alcoholism /alcohol abuse ([place])   . Anemia   . Anxiety   . Arthritis    "knees; arms; elbows" (03/26/2015)  . Asthma   . Bipolar disorder ([place])   . Chronic bronchitis ([place])   . Chronic lower back pain   . Cocaine abuse   . Depression   . Family history of adverse reaction to anesthesia    "grandmother gets confused"  . Femoral condyle fracture ([place]) 03/08/2014   left medial/notes 03/09/2014  . GERD (gastroesophageal reflux disease)   . H/O hiatal hernia   . H/O suicide attempt 10/2012  . Heart murmur    "when he was little" (03/06/2013)  . High cholesterol   . History of blood transfusion 10/2012   "when I tried to commit suicide"  . History of stomach ulcers   . Hypertension   . Marijuana abuse, continuous   . Migraine    "a few times/year" (03/26/2015)  . Pancreatitis   . Pneumonia 1990's X 3  . PTSD (post-traumatic stress disorder)   . Shortness of breath    "  can happen at anytime" (03/06/2013)  . Sickle cell trait ([place])   . WPW (Wolff-Parkinson-White syndrome)    [name] 03/06/2013    Past Surgical History:  Procedure Laterality Date  . CARDIAC CATHETERIZATION    . EYE SURGERY Left 1990's   "result of trauma"   . FACIAL FRACTURE SURGERY Left 1990's   "result of trauma"   . FRACTURE SURGERY    . HERNIA REPAIR    . LEFT HEART CATHETERIZATION WITH CORONARY ANGIOGRAM Right 03/07/2013   Procedure: LEFT HEART CATHETERIZATION WITH CORONARY ANGIOGRAM;  Surgeon: [name], MD;   Location: [facility] CATH [facility];  Service: Cardiovascular;  Laterality: Right;  . UMBILICAL HERNIA REPAIR     Family History:  Family History  Problem Relation Age of Onset  . Hypertension Other   . Coronary artery disease Other    Family Psychiatric  History: As in H&P Social History:  History  Alcohol Use  . 7.2 oz/week  . 12 Cans of beer per week     History  Drug Use  . Types: Marijuana, Cocaine    Social History   Social History  . Marital status: Single    Spouse name: N/A  . Number of children: N/A  . Years of education: N/A   Social History Main Topics  . Smoking status: Current Every Day Smoker    Packs/day: 1.50    Years: 30.00    Types: Cigarettes  . Smokeless tobacco: Current User    Types: Chew  . Alcohol use 7.2 oz/week    12 Cans of beer per week  . Drug use: Yes    Types: Marijuana, Cocaine  . Sexual activity: Yes    Birth control/ protection: None   Other Topics Concern  . None   Social History Narrative  . None   Additional Social History:        Sleep: Fair  Appetite:  Fair  Current Medications: Current Facility-Administered Medications  Medication Dose Route Frequency Provider Last Rate Last Dose  . albuterol (PROVENTIL HFA;VENTOLIN HFA) 108 (90 Base) MCG/ACT inhaler 2 puff  2 puff Inhalation Q6H PRN Artist Beach, MD   2 puff at 07/24/16 1441  . benzocaine (ORAJEL) 10 % mucosal gel   Mouth/Throat QID PRN Laverle Hobby, PA-C      . chlorhexidine (PERIDEX) 0.12 % solution 15 mL  15 mL Mouth/Throat QID Laverle Hobby, PA-C   15 mL at 07/25/16 0925  . cholestyramine light (PREVALITE) packet 4 g  4 g Oral TID PRN [name], NP      . famotidine (PEPCID) tablet 20 mg  20 mg Oral BID [name], NP   20 mg at 07/25/16 0927  . fluticasone (FLONASE) 50 MCG/ACT nasal spray 2 spray  2 spray Each Nare Daily Laverle Hobby, PA-C   2 spray at 07/25/16 0926  . gabapentin (NEURONTIN) capsule 300 mg  300 mg Oral TID Kerrie Buffalo, NP   300  mg at 07/25/16 1147  . gi cocktail (Maalox,Lidocaine,Donnatal)  30 mL Oral TID PRN [name], NP   30 mL at 07/25/16 1242  . haloperidol (HALDOL) tablet 5 mg  5 mg Oral BID Kerrie Buffalo, NP   5 mg at 07/25/16 2841  . hydrocerin (EUCERIN) cream   Topical BID Laverle Hobby, PA-C   1 application at 32/44/01 0930  . hydrOXYzine (ATARAX/VISTARIL) tablet 25 mg  25 mg Oral Q6H PRN Artist Beach, MD   [phone]  mg at 07/25/16 1151  . ibuprofen (ADVIL,MOTRIN) tablet 800 mg  800 mg Oral Q6H PRN Ursula Alert, MD   800 mg at 07/25/16 1243  . lactose free nutrition (BOOST PLUS) liquid 237 mL  237 mL Oral TID WC Myer Peer Cobos, MD   237 mL at 07/25/16 1148  . lipase/protease/amylase (CREON) capsule 24,000 Units  24,000 Units Oral TID AC [name], NP   24,000 Units at 07/25/16 1147  . loratadine (CLARITIN) tablet 10 mg  10 mg Oral Daily Laverle Hobby, PA-C   10 mg at 07/25/16 6789  . magnesium hydroxide (MILK OF MAGNESIA) suspension 30 mL  30 mL Oral Daily PRN [name], NP      . metoprolol tartrate (LOPRESSOR) tablet 25 mg  25 mg Oral BID [name], NP   25 mg at 07/25/16 3810  . multivitamin with minerals tablet 1 tablet  1 tablet Oral Daily Laverle Hobby, PA-C   1 tablet at 07/25/16 1751  . naltrexone (DEPADE) tablet 50 mg  50 mg Oral Daily Artist Beach, MD   50 mg at 07/25/16 0926  . nicotine polacrilex (NICORETTE) gum 2 mg  2 mg Oral PRN Encarnacion Slates, NP   2 mg at 07/25/16 1421  . ondansetron (ZOFRAN-ODT) disintegrating tablet 4 mg  4 mg Oral Q8H PRN Kerrie Buffalo, NP   4 mg at 07/25/16 0928  . pantoprazole (PROTONIX) EC tablet 40 mg  40 mg Oral BID [name], NP   40 mg at 07/25/16 0258  . sertraline (ZOLOFT) tablet 75 mg  75 mg Oral Daily [name], NP   75 mg at 07/25/16 0926  . sucralfate (CARAFATE) tablet 1 g  1 g Oral TID WC & HS [name], NP   1 g at 07/25/16 1146  . thiamine (VITAMIN B-1) tablet 100 mg  100 mg Oral Daily Laverle Hobby, PA-C    100 mg at 07/25/16 5277    Lab Results:  Results for orders placed or performed during the hospital encounter of 07/20/16 (from the past 48 hour(s))  Lipid panel     Status: Abnormal   Collection Time: 07/25/16  6:07 AM  Result Value Ref Range   Cholesterol 231 (H) 0 - 200 mg/dL   Triglycerides 86 <150 mg/dL   HDL 124 >40 mg/dL   Total CHOL/HDL Ratio 1.9 RATIO   VLDL 17 0 - 40 mg/dL   LDL Cholesterol 90 0 - 99 mg/dL    Comment:        Total Cholesterol/HDL:CHD Risk Coronary Heart Disease Risk Table                     Men   Women  1/2 Average Risk   3.4   3.3  Average Risk       5.0   4.4  2 X Average Risk   9.6   7.1  3 X Average Risk  23.4   11.0        Use the calculated Patient Ratio above and the CHD Risk Table to determine the patient's CHD Risk.        ATP III CLASSIFICATION (LDL):  <100     mg/dL   Optimal  100-129  mg/dL   Near or Above                    Optimal  130-159  mg/dL   Borderline  160-189  mg/dL   High  >190     mg/dL   Very High Performed at [facility] [address], [place], [place] [address]   TSH     Status: None   Collection Time: 07/25/16  6:07 AM  Result Value Ref Range   TSH 1.566 0.350 - 4.500 uIU/mL    Comment: Performed by a 3rd Generation assay with a functional sensitivity of <=0.01 uIU/mL. Performed at [facility], [place] [address] [place], [place] [address]     Blood Alcohol level:  Lab Results  Component Value Date   ETH 98 (H) 07/19/2016   ETH <5 14/48/1856    Metabolic Disorder Labs: Lab Results  Component Value Date   HGBA1C 4.8 02/23/2014   MPG 91 02/23/2014   MPG 82 10/07/2013   No results found for: PROLACTIN Lab Results  Component Value Date   CHOL 231 (H) 07/25/2016   TRIG 86 07/25/2016   HDL 124 07/25/2016   CHOLHDL 1.9 07/25/2016   VLDL 17 07/25/2016   LDLCALC 90 07/25/2016   LDLCALC 52 01/30/2014    Physical Findings: AIMS: Facial and Oral Movements Muscles of Facial  Expression: None, normal Lips and Perioral Area: None, normal Jaw: None, normal Tongue: None, normal,Extremity Movements Upper (arms, wrists, hands, fingers): None, normal Lower (legs, knees, ankles, toes): None, normal, Trunk Movements Neck, shoulders, hips: None, normal, Overall Severity Severity of abnormal movements (highest score from questions above): None, normal Incapacitation due to abnormal movements: None, normal Patient's awareness of abnormal movements (rate only patient's report): No Awareness, Dental Status Current problems with teeth and/or dentures?: No Does patient usually wear dentures?: No  CIWA:  CIWA-Ar Total: 2 COWS:  COWS Total Score: 4  Musculoskeletal: Strength & Muscle Tone: within normal limits Gait & Station: normal Patient leans: N/A  Psychiatric Specialty Exam: Physical Exam  Nursing note and vitals reviewed. Psychiatric:  As above    Review of Systems  Gastrointestinal: Positive for abdominal pain.  Psychiatric/Behavioral: The patient is nervous/anxious.   All other systems reviewed and are negative.   Blood pressure (!) 128/94, pulse 75, temperature 98.9 F (37.2 C), temperature source Oral, resp. rate 20, height 5\' 8"  (1.727 m), weight 59.4 kg (131 lb), SpO2 100 %.Body mass index is 19.92 kg/m.  General Appearance:  casual  Eye Contact:  Fair  Speech:  Clear and Coherent and Normal Rate  Volume:  Normal  Mood: anxious at times , cheerful otherwise  Affect:  Congruent  Thought Process:  Goal Directed and Descriptions of Associations: Intact  Orientation:  Full (Time, Place, and Person)  Thought Content:  linear  Suicidal Thoughts:  No  Homicidal Thoughts:  No  Memory:  Immediate;   Fair Recent;   Fair Remote;   Fair  Judgement:  Fair  Insight:fair  Psychomotor Activity:  Normal  Concentration:  Concentration: Fair and Attention Span: Fair  Recall:  [employer] of Knowledge:  Fair  Language:  Fair  Akathisia:  No  Handed:    AIMS  (if indicated):     Assets:  Communication Skills Desire for Improvement  ADL's:  Intact  Cognition:  WNL  Sleep:  Number of Hours: 5.5    Major depressive disorder, recurrent severe without psychotic features ([place]) improving  Will continue today 07/25/16  plan as below except where it is noted.    Treatment Plan Summary:Patient seen as in pain initially , later on seen as laughing and smiling ,  likely medication seeking, has substance abuse problem, is motivated to go to [facility] for screening in the AM. Will continue treatment.    Psychiatric: SUD (alcohol, cocaine and THC) Substance induced mood disorder  Medical: Pancreatitis GERD HTN WPW syndrome Asthma   Psychosocial:  Relationship issues  PLAN: Continue current treatment regimen, no changes made. Continue Haldol 5 mg po bid for mood sx. Continue Gabapentin 300 mtg po bid for anxiety sx. Spoke to [facility] regarding EKG abnormality , she wants another EKG done and said she will follow up, ordered EKG again [facility] screening tomorrow AM. CSW will work on disposition.   Adonai Selsor, MD  07/25/2016, 3:10 PM

## 2016-07-25 NOTE — BHH Suicide Risk Assessment (Signed)
[facility] Discharge Suicide Risk Assessment   Principal Problem: Major depressive disorder, recurrent severe without psychotic features [facility]) Discharge Diagnoses:  Patient Active Problem List   Diagnosis Date Noted  . Alcohol use disorder, severe, dependence ([place]) [F10.20] 07/25/2016  . Cocaine use disorder, severe, dependence ([place]) [F14.20] 07/25/2016  . Major depressive disorder, recurrent severe without psychotic features ([place]) [F33.2] 07/20/2016  . Leukocytosis [D72.829]   . Hospital acquired PNA [J18.9] 05/20/2015  . Pancreatitis [K85.90] 05/18/2015  . Pseudocyst of pancreas [K86.3] 05/18/2015  . Malnutrition of moderate degree [E44.0] 03/27/2015  . Abnormal transaminases [R74.8] 03/26/2015  . Polysubstance abuse (tobacco, cocaine, THC, and ETOH) [F19.10] 03/26/2015  . Acute pancreatitis [K85.90] 03/26/2015  . Alcohol-induced chronic pancreatitis ([place]) [K86.0]   . Essential hypertension [I10] 02/06/2014  . Alcohol-induced acute pancreatitis [K85.20] 11/28/2013  . Pancreatic pseudocyst/cyst [W97.9, K86.3] 11/25/2013  . Alcohol dependence ([place]) [F10.20] 10/23/2013  . MDD (major depressive disorder), recurrent severe, without psychosis ([place]) [F33.2] 10/22/2013  . Protein-calorie malnutrition, severe ([place]) [E43] 10/10/2013  . Suicide attempt [T14.91XA] 10/08/2013  . 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 TOBACCO ABUSE [F17.200] 03/23/2007    Total Time spent with patient: 30 minutes  Musculoskeletal: Strength & Muscle Tone: within normal limits Gait & Station: normal Patient leans: N/A  Psychiatric Specialty Exam: Review of Systems  Psychiatric/Behavioral: Positive for substance abuse. Negative for depression and suicidal ideas.  All other systems reviewed and are negative.   Blood pressure (!) 128/94, pulse 75, temperature 98.9 F (37.2 C), temperature source Oral, resp. rate 20, height 5\' 8"  (1.727 m), weight 59.4 kg (131 lb), SpO2 100  %.Body mass index is 19.92 kg/m.  General Appearance: Casual  Eye Contact::  Fair  Speech:  Clear and Coherent409  Volume:  Normal  Mood:  Euthymic  Affect:  Appropriate  Thought Process:  Goal Directed and Descriptions of Associations: Intact  Orientation:  Full (Time, Place, and Person)  Thought Content:  Logical  Suicidal Thoughts:  No  Homicidal Thoughts:  No  Memory:  Immediate;   Fair Recent;   Fair Remote;   Fair  Judgement:  Fair  Insight:  Fair  Psychomotor Activity:  Normal  Concentration:  Fair  Recall:  [employer] of Knowledge:Fair  Language: Fair  Akathisia:  No  Handed:  Right  AIMS (if indicated):     Assets:  Communication Skills Desire for Improvement  Sleep:  Number of Hours: 5.5  Cognition: WNL  ADL's:  Intact   Mental Status Per Nursing Assessment::   On Admission:  NA  Demographic Factors:  Male  Loss Factors: NA  Historical Factors: Impulsivity  Risk Reduction Factors:   Positive therapeutic relationship  Continued Clinical Symptoms:  Alcohol/Substance Abuse/Dependencies Previous Psychiatric Diagnoses and Treatments Medical Diagnoses and Treatments/Surgeries  Cognitive Features That Contribute To Risk:  None    Suicide Risk:  Minimal: No identifiable suicidal ideation.  Patients presenting with no risk factors but with morbid ruminations; may be classified as minimal risk based on the severity of the depressive symptoms  Follow-up Information    [facility] Follow up.   Specialty:  Behavioral Health Why:  Walk in within 7 days of hospital/inpatient treatment discharge to be assessed for services including: Medication management, counseling, substance abuse groups. Walk in hours are 8am-9am Monday through Friday. Thank you.  Contact information: [place] [address] [phone]        [facility] Follow up on 07/26/2016.  Why:  Screening for possible admission on Tuesday at 0745AM. Please arrive with  Photo ID, 14 day supply of medications, and 30 day prescriptions. Thank you.  Contact information: [place] [address] [phone]        [facility] Follow up.   Why:  Referral faxed: 07/22/16 to [facility] in Admissions.  Contact information: [place] [place], [place] [address] Phone: [phone] Fax: [phone]          Plan Of Care/Follow-up recommendations:  Activity:  no restrictions Diet:  regular Tests:  follow up on your abdominal pain , if it worsens with your primary medical doctor Other:  none  Mitchel Delduca, MD 07/25/2016, 3:13 PM

## 2016-07-25 NOTE — Progress Notes (Signed)
Patient has been up in the dayroom watching tv and minimal interaction with peers. He reports feeling worried about the cardiologist consult and says he already knew that he had a heart condition. His girlfriend ( fiance ) visited tonight which seemed to help his mood.. He has had no more outburst or altercations with patients. Support given and safety maintained on unit with 15 min checks

## 2016-07-25 NOTE — Progress Notes (Signed)
  [facility] Adult Case Management Discharge Plan :  Will you be returning to the same living situation after discharge:  No. [facility] for screening for admission appointment At discharge, do you have transportation home?: Yes,  cab Do you have the ability to pay for your medications: Yes,  mental health  Release of information consent forms completed and in the chart;  Patient's signature needed at discharge.  Patient to Follow up at: Follow-up Information    [facility] Follow up.   Specialty:  Behavioral Health Why:  Walk in within 7 days of hospital/inpatient treatment discharge to be assessed for services including: Medication management, counseling, substance abuse groups. Walk in hours are 8am-9am Monday through Friday. Thank you.  Contact information: [place] [address] [phone]        [facility] Follow up on 07/26/2016.   Why:  Screening for possible admission on Tuesday at 0745AM. Please arrive with Photo ID, 14 day supply of medications, and 30 day prescriptions. Thank you.  Contact information: [place] [address] [phone]           Next level of care provider has access to [place] and Suicide Prevention discussed: Yes,  yes  Have you used any form of tobacco in the last 30 days? (Cigarettes, Smokeless Tobacco, Cigars, and/or Pipes): Yes  Has patient been referred to the Quitline?: Patient refused referral  Patient has been referred for addiction treatment: Yes  Trish Mage 07/25/2016, 4:09 PM
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
## 2016-07-25 NOTE — Progress Notes (Signed)
Recreation Therapy Notes  Date: 07/25/16 Time: 1000 Location: 500 Hall Dayroom  Group Topic: Coping Skills  Goal Area(s) Addresses:  Patient will be able to identify positive coping skills. Patient will be able to identify the benefits of coping skills. Patient will be able to identify benefits of using coping skills post d/c.  Behavioral Response: Engaged  Intervention: Magazines, glue sticks, scissors, coping skills worksheet  Activity: Patients were to identify coping skills that could be used for diversions, social, cognitive, tension releasers and physically.  Patients were to find pictures in the magazines that depicted the coping skills they would use for each of these areas and glue them to their worksheet.  Education:Coping Skills, Discharge Planning.   Education Outcome: Acknowledges understanding/In group clarification offered/Needs additional education.   Clinical Observations/Feedback: Pt identified being in nature as a coping skill for diversions, talking to people as social, exercise as cognitive, working out as a tension releaser and football as physical.  Pt stated "if I don't use my coping skills, I sit around in a rut and I'll be cranky and unfriendly".  Pt also stated he is nicer and friendlier when he uses his coping skills.   Victorino Sparrow, LRT/CTRS        Ria Comment, Skylen Spiering A 07/25/2016 12:12 PM

## 2016-07-25 NOTE — [facility] Group Notes (Signed)
[facility] LCSW Group Therapy  07/25/2016 1:15 pm  Type of Therapy: Process Group Therapy  Participation Level:  Active  Participation Quality:  Appropriate  Affect:  Flat  Cognitive:  Oriented  Insight:  Improving  Engagement in Group:  Limited  Engagement in Therapy:  Limited  Modes of Intervention:  Activity, Clarification, Education, Problem-solving and Support  Summary of Progress/Problems: Today's group addressed the issue of overcoming obstacles.  Patients were asked to identify their biggest obstacle post d/c that stands in the way of their on-going success, and then problem solve as to how to manage this. In and out multiple times, but engaged while there.  Talked about dealing with negative thoughts by "goin' fishin'" and went into detail about equipment and where he goes.  Cited his grandmother as the one who taught him to love this.  Later talked about needing to change his strategies for "dealing with the pull of drugs, because it is strong."  Stated that going to find his old friends after he gets out of [facility], tempting as it might be, would be a bad idea.  "I need to hang with my wife and her friends.  They don't use, and I don't get in trouble."  Roque Lias B 07/25/2016   4:00 PM

## 2016-07-25 NOTE — Tx Team (Signed)
Interdisciplinary Treatment and Diagnostic Plan Update  07/25/2016 Time of Session: 0930 [place] Kristy Catoe MRN: [id number]  Principal Diagnosis: Bipolar Disorder with Psychotic Features   Secondary Diagnoses: Principal Problem:   Major depressive disorder, recurrent severe without psychotic features ([place]) Active Problems:   Alcohol use disorder, severe, dependence ([place])   Cocaine use disorder, severe, dependence ([place])   Current Medications:  Current Facility-Administered Medications  Medication Dose Route Frequency Provider Last Rate Last Dose  . albuterol (PROVENTIL HFA;VENTOLIN HFA) 108 (90 Base) MCG/ACT inhaler 2 puff  2 puff Inhalation Q6H PRN Artist Beach, MD   2 puff at 07/24/16 1441  . benzocaine (ORAJEL) 10 % mucosal gel   Mouth/Throat QID PRN Laverle Hobby, PA-C      . chlorhexidine (PERIDEX) 0.12 % solution 15 mL  15 mL Mouth/Throat QID Laverle Hobby, PA-C   15 mL at 07/25/16 0925  . cholestyramine light (PREVALITE) packet 4 g  4 g Oral TID PRN [name], NP      . famotidine (PEPCID) tablet 20 mg  20 mg Oral BID [name], NP   20 mg at 07/25/16 0927  . fluticasone (FLONASE) 50 MCG/ACT nasal spray 2 spray  2 spray Each Nare Daily Laverle Hobby, PA-C   2 spray at 07/25/16 0926  . gabapentin (NEURONTIN) capsule 300 mg  300 mg Oral TID Kerrie Buffalo, NP   300 mg at 07/25/16 1147  . gi cocktail (Maalox,Lidocaine,Donnatal)  30 mL Oral TID PRN [name], NP   30 mL at 07/25/16 1242  . haloperidol (HALDOL) tablet 5 mg  5 mg Oral BID Kerrie Buffalo, NP   5 mg at 07/25/16 9675  . hydrocerin (EUCERIN) cream   Topical BID Laverle Hobby, PA-C   1 application at 91/63/84 0930  . hydrOXYzine (ATARAX/VISTARIL) tablet 25 mg  25 mg Oral Q6H PRN Artist Beach, MD   25 mg at 07/25/16 1151  . ibuprofen (ADVIL,MOTRIN) tablet 800 mg  800 mg Oral Q6H PRN Ursula Alert, MD   800 mg at 07/25/16 1243  . lactose free nutrition (BOOST PLUS) liquid 237 mL  237 mL  Oral TID WC Myer Peer Cobos, MD   237 mL at 07/25/16 1148  . lipase/protease/amylase (CREON) capsule 24,000 Units  24,000 Units Oral TID AC [name], NP   24,000 Units at 07/25/16 1147  . loratadine (CLARITIN) tablet 10 mg  10 mg Oral Daily Laverle Hobby, PA-C   10 mg at 07/25/16 6659  . magnesium hydroxide (MILK OF MAGNESIA) suspension 30 mL  30 mL Oral Daily PRN [name], NP      . metoprolol tartrate (LOPRESSOR) tablet 25 mg  25 mg Oral BID [name], NP   25 mg at 07/25/16 9357  . multivitamin with minerals tablet 1 tablet  1 tablet Oral Daily Laverle Hobby, PA-C   1 tablet at 07/25/16 0177  . naltrexone (DEPADE) tablet 50 mg  50 mg Oral Daily Artist Beach, MD   50 mg at 07/25/16 0926  . nicotine polacrilex (NICORETTE) gum 2 mg  2 mg Oral PRN Encarnacion Slates, NP   2 mg at 07/25/16 1421  . ondansetron (ZOFRAN-ODT) disintegrating tablet 4 mg  4 mg Oral Q8H PRN Kerrie Buffalo, NP   4 mg at 07/25/16 0928  . pantoprazole (PROTONIX) EC tablet 40 mg  40 mg Oral BID [name], NP   40 mg at 07/25/16  2993  . sertraline (ZOLOFT) tablet 75 mg  75 mg Oral Daily [name], NP   75 mg at 07/25/16 0926  . sucralfate (CARAFATE) tablet 1 g  1 g Oral TID WC & HS [name], NP   1 g at 07/25/16 1146  . thiamine (VITAMIN B-1) tablet 100 mg  100 mg Oral Daily Laverle Hobby, PA-C   100 mg at 07/25/16 7169   PTA Medications: Prescriptions Prior to Admission  Medication Sig Dispense Refill Last Dose  . aluminum-magnesium hydroxide-simethicone (MAALOX) 678-938-10 MG/5ML SUSP Take 30 mLs by mouth every 6 (six) hours as needed (for indigestion/heartburn).   07/19/2016 at Unknown time  . ARIPiprazole (ABILIFY) 5 MG tablet Take 5 mg by mouth at bedtime.    07/19/2016 at Unknown time  . metoprolol tartrate (LOPRESSOR) 25 MG tablet Take 1 tablet (25 mg total) by mouth 2 (two) times daily. 180 tablet 3 07/19/2016 at 0700  . sertraline (ZOLOFT) 25 MG tablet Take 75 mg by mouth daily.    07/19/2016 at Unknown time  . sildenafil (VIAGRA) 50 MG tablet Take 1 tablet (50 mg total) by mouth daily as needed for erectile dysfunction. 10 tablet 3 Past Month at Unknown time  . [DISCONTINUED] albuterol (PROVENTIL HFA;VENTOLIN HFA) 108 (90 BASE) MCG/ACT inhaler Inhale 2 puffs into the lungs every 6 (six) hours as needed for wheezing or shortness of breath.    Past Month at Unknown time  . [DISCONTINUED] cholestyramine (QUESTRAN) 4 g packet Take 1 packet (4 g total) by mouth 3 (three) times daily as needed (diarrhea). 60 each 12 Past Month at Unknown time  . [DISCONTINUED] fluticasone (FLONASE) 50 MCG/ACT nasal spray Place 1 spray into both nostrils daily as needed for rhinitis.    07/19/2016 at Unknown time  . [DISCONTINUED] lipase/protease/amylase (CREON) 12000 UNITS CPEP capsule Take 2 capsules (24,000 Units total) by mouth 3 (three) times daily with meals. 270 capsule 3 Past Month at Unknown time  . [DISCONTINUED] ondansetron (ZOFRAN) 4 MG tablet Take 1 tablet (4 mg total) by mouth every 8 (eight) hours as needed for nausea or vomiting. 12 tablet 0 07/19/2016 at Unknown time  . [DISCONTINUED] pantoprazole (PROTONIX) 20 MG tablet Take 2 tablets (40 mg total) by mouth daily. 30 tablet 0 07/19/2016 at Unknown time  . [DISCONTINUED] sucralfate (CARAFATE) 1 g tablet Take 1 tablet (1 g total) by mouth 4 (four) times daily -  with meals and at bedtime. 30 tablet 0 07/19/2016 at Unknown time    Patient Stressors: Health problems Loss of fiance miscarriage Substance abuse  Patient Strengths: Ability for insight Curator fund of knowledge Motivation for treatment/growth Supportive family/friends  Treatment Modalities: Medication Management, Group therapy, Case management,  1 to 1 session with clinician, Psychoeducation, Recreational therapy.   Physician Treatment Plan for Primary Diagnosis: Bipolar Disorder with Psychotic Features  Long Term Goal(s): Improvement in symptoms so  as ready for discharge Improvement in symptoms so as ready for discharge   Short Term Goals: Ability to identify changes in lifestyle to reduce recurrence of condition will improve Ability to verbalize feelings will improve Ability to disclose and discuss suicidal ideas Ability to demonstrate self-control will improve Ability to identify and develop effective coping behaviors will improve Ability to maintain clinical measurements within normal limits will improve Compliance with prescribed medications will improve Ability to identify triggers associated with substance abuse/mental health issues will improve Ability to identify changes in lifestyle to reduce recurrence of condition will  improve Ability to verbalize feelings will improve Ability to disclose and discuss suicidal ideas Ability to demonstrate self-control will improve Ability to identify and develop effective coping behaviors will improve Ability to maintain clinical measurements within normal limits will improve Compliance with prescribed medications will improve Ability to identify triggers associated with substance abuse/mental health issues will improve  Medication Management: Evaluate patient's response, side effects, and tolerance of medication regimen.  Therapeutic Interventions: 1 to 1 sessions, Unit Group sessions and Medication administration.  Evaluation of Outcomes: Adequate for Discharge  Physician Treatment Plan for Secondary Diagnosis: Principal Problem:   Major depressive disorder, recurrent severe without psychotic features ([place]) Active Problems:   Alcohol use disorder, severe, dependence ([place])   Cocaine use disorder, severe, dependence ([place])  Long Term Goal(s): Improvement in symptoms so as ready for discharge Improvement in symptoms so as ready for discharge   Short Term Goals: Ability to identify changes in lifestyle to reduce recurrence of condition will improve Ability to verbalize feelings will  improve Ability to disclose and discuss suicidal ideas Ability to demonstrate self-control will improve Ability to identify and develop effective coping behaviors will improve Ability to maintain clinical measurements within normal limits will improve Compliance with prescribed medications will improve Ability to identify triggers associated with substance abuse/mental health issues will improve Ability to identify changes in lifestyle to reduce recurrence of condition will improve Ability to verbalize feelings will improve Ability to disclose and discuss suicidal ideas Ability to demonstrate self-control will improve Ability to identify and develop effective coping behaviors will improve Ability to maintain clinical measurements within normal limits will improve Compliance with prescribed medications will improve Ability to identify triggers associated with substance abuse/mental health issues will improve     Medication Management: Evaluate patient's response, side effects, and tolerance of medication regimen.  Therapeutic Interventions: 1 to 1 sessions, Unit Group sessions and Medication administration.  Evaluation of Outcomes: Adequate for Discharge   RN Treatment Plan for Primary Diagnosis: Bipolar Disorder with Psychotic Features  Long Term Goal(s): Knowledge of disease and therapeutic regimen to maintain health will improve  Short Term Goals: Ability to remain free from injury will improve, Ability to verbalize feelings will improve and Ability to disclose and discuss suicidal ideas  Medication Management: RN will administer medications as ordered by provider, will assess and evaluate patient's response and provide education to patient for prescribed medication. RN will report any adverse and/or side effects to prescribing provider.  Therapeutic Interventions: 1 on 1 counseling sessions, Psychoeducation, Medication administration, Evaluate responses to treatment, Monitor vital  signs and CBGs as ordered, Perform/monitor CIWA, COWS, AIMS and Fall Risk screenings as ordered, Perform wound care treatments as ordered.  Evaluation of Outcomes: Adequate for Discharge   LCSW Treatment Plan for Primary Diagnosis:Bipolar Disorder with Psychotic Features  Long Term Goal(s): Safe transition to appropriate next level of care at discharge, Engage patient in therapeutic group addressing interpersonal concerns.  Short Term Goals: Engage patient in aftercare planning with referrals and resources, Facilitate patient progression through stages of change regarding substance use diagnoses and concerns and Identify triggers associated with mental health/substance abuse issues  Therapeutic Interventions: Assess for all discharge needs, 1 to 1 time with Social worker, Explore available resources and support systems, Assess for adequacy in community support network, Educate family and significant other(s) on suicide prevention, Complete Psychosocial Assessment, Interpersonal group therapy.  Evaluation of Outcomes: Adequate for Discharge   Progress in Treatment: Attending groups: No. New to unit. Continuing to assess.  Participating in groups: No. Taking medication as prescribed: Yes. Toleration medication: Yes. Family/Significant other contact made: No, will contact:  family member/girlfriend if patient consents Patient understands diagnosis: Yes. Discussing patient identified problems/goals with staff: Yes. Medical problems stabilized or resolved: Yes. Denies suicidal/homicidal ideation: Yes. Issues/concerns per patient self-inventory: No. Other: n/a  New problem(s) identified: No, Describe:  n/a  New Short Term/Long Term Goal(s): detox; medication stabilization; development of comprehensive mental wellness/sobriety plan.   Discharge Plan or Barriers: CSW assessing for appropriate referrals. Pt has history at [facility] but has not been seen "in a long time." Patient identifies his  girlfriend as a support person.   Reason for Continuation of Hospitalization:   Estimated Length of Stay: Likely d/c tomorrow.  Go to [facility] for a screening for admission appointment via cab  Attendees: Patient: 07/25/2016 4:05 PM  Physician: Dr. Sanjuana Letters MD 07/25/2016 4:05 PM  Nursing: Otilio Carpen, RN 07/25/2016 4:05 PM  RN Care Manager: Lars Pinks CM 07/25/2016 4:05 PM  Social Worker: Ripley Fraise, [place];  07/25/2016 4:05 PM  Recreational Therapist: Winfield Cunas 07/25/2016 4:05 PM  Other:  07/25/2016 4:05 PM  Other:  07/25/2016 4:05 PM  Other: 07/25/2016 4:05 PM    Scribe for Treatment Team: Trish Mage, LCSW 07/25/2016 4:05 PM

## 2016-07-25 NOTE — Consult Note (Signed)
Medical Consultation   [name]  FAO:[id number]  DOB: July 13, 1970  DOA: 07/20/2016  PCP: [name], NP  Outpatient Specialists: unknown.    Requesting physician: Dr Shea Evans  Reason for consultation: Abnormal EKG.   History of Present Illness: [name] is an 46 y.o. male with h/o hypertension, pancreatitis, WPW syndrome diagnosed in 2014, bipolar disorder, asthma, substance abuse, anemia, was admitted for bipolar disorder with psychotic features to [facility]. An EKG was done on 3/18 showed Sinus rhythm with sinus arrhythmia, with st t wave abnormalities. Medical service   Was Consulted for evaluation of abnormal EKG. Patient denies any chest pain, sob, PND, orthopnea, pedal edema or syncope. Pt reports some dizziness occasionally and palpitations not associated with nausea, diaphoresis or exertion. He reports that he underwent stress test in 2014 at [facility] and it does not show any fixed or reversible deficit, normal left EF 67%. He underwent cardiac catheterization in 02/2013 was normal with LVEF 70%.   Review of Systems:  ROS As per HPI otherwise 10 point review of systems negative.    Past Medical History: Past Medical History:  Diagnosis Date  . Alcoholism /alcohol abuse ([place])   . Anemia   . Anxiety   . Arthritis    "knees; arms; elbows" (03/26/2015)  . Asthma   . Bipolar disorder ([place])   . Chronic bronchitis ([place])   . Chronic lower back pain   . Cocaine abuse   . Depression   . Family history of adverse reaction to anesthesia    "grandmother gets confused"  . Femoral condyle fracture ([place]) 03/08/2014   left medial/notes 03/09/2014  . GERD (gastroesophageal reflux disease)   . H/O hiatal hernia   . H/O suicide attempt 10/2012  . Heart murmur    "when he was little" (03/06/2013)  . High cholesterol   . History of blood transfusion 10/2012   "when I tried to commit suicide"  . History of stomach ulcers   . Hypertension   . Marijuana  abuse, continuous   . Migraine    "a few times/year" (03/26/2015)  . Pancreatitis   . Pneumonia 1990's X 3  . PTSD (post-traumatic stress disorder)   . Shortness of breath    "can happen at anytime" (03/06/2013)  . Sickle cell trait ([place])   . WPW (Wolff-Parkinson-White syndrome)    [name] 03/06/2013    Past Surgical History: Past Surgical History:  Procedure Laterality Date  . CARDIAC CATHETERIZATION    . EYE SURGERY Left 1990's   "result of trauma"   . FACIAL FRACTURE SURGERY Left 1990's   "result of trauma"   . FRACTURE SURGERY    . HERNIA REPAIR    . LEFT HEART CATHETERIZATION WITH CORONARY ANGIOGRAM Right 03/07/2013   Procedure: LEFT HEART CATHETERIZATION WITH CORONARY ANGIOGRAM;  Surgeon: [name], MD;  Location: [facility] CATH LAB;  Service: Cardiovascular;  Laterality: Right;  . UMBILICAL HERNIA REPAIR       Allergies:   Allergies  Allergen Reactions  . Shellfish-Derived Products Nausea And Vomiting  . Trazodone And Nefazodone Other (See Comments)    Reaction:  Muscle spasms  . Robaxin [Methocarbamol] Other (See Comments)    "jumpy limbs"  . Reglan [Metoclopramide] Other (See Comments)    Reaction:  Muscle spasms     Social History:  reports that he has been smoking Cigarettes.  He has a 45.00 pack-year smoking history. His  smokeless tobacco use includes Chew. He reports that he drinks about 7.2 oz of alcohol per week . He reports that he uses drugs, including Marijuana and Cocaine.   Family History: Family History  Problem Relation Age of Onset  . Hypertension Other   . Coronary artery disease Other     Family h/o CAD in grand parents.   Physical Exam: Vitals:   07/25/16 0620 07/25/16 0621 07/25/16 1201 07/25/16 1715  BP: (!) 143/83 (!) 126/91 (!) 128/94 116/69  Pulse: 70 91 75 74  Resp: 20     Temp: 98.9 F (37.2 C)     TempSrc: Oral     SpO2:      Weight:      Height:        Constitutional: Alert and awake, oriented x3, not in any acute  distress. Eyes: PERLA, EOMI, irises appear normal, anicteric sclera,  ENMT: external ears and nose appear normal,             Lips appears normal, oropharynx mucosa, tongue, posterior pharynx appear normal  Neck: neck appears normal, no masses, normal ROM, no thyromegaly, no JVD  CVS: S1-S2 clear, no murmur rubs or gallops, no LE edema, normal pedal pulses  Respiratory:  clear to auscultation bilaterally, no wheezing, rales or rhonchi. Respiratory effort normal. No accessory muscle use.  Abdomen: soft nontender, nondistended, normal bowel sounds, no hepatosplenomegaly, no hernias  Musculoskeletal: : no cyanosis, clubbing or edema noted bilaterally Neuro: Cranial nerves II-XII intact, strength, sensation,  Skin: no rashes or lesions or ulcers, no induration or nodules     Data reviewed:  I have personally reviewed following labs and imaging studies Labs:  CBC:  Recent Labs Lab 07/19/16 1457  WBC 7.8  HGB 11.0*  HCT 33.5*  MCV 88.2  PLT 025    Basic Metabolic Panel:  Recent Labs Lab 07/19/16 1457  NA 142  K 3.4*  CL 108  CO2 26  GLUCOSE 61*  BUN 6  CREATININE 0.66  CALCIUM 9.0   GFR Estimated Creatinine Clearance: 95.9 mL/min (by C-G formula based on SCr of 0.66 mg/dL). Liver Function Tests:  Recent Labs Lab 07/19/16 1457  AST 46*  ALT 40  ALKPHOS 92  BILITOT 0.7  PROT 8.3*  ALBUMIN 4.5    Recent Labs Lab 07/21/16 0611  LIPASE 30   No results for input(s): AMMONIA in the last 168 hours. Coagulation profile No results for input(s): INR, PROTIME in the last 168 hours.  Cardiac Enzymes: No results for input(s): CKTOTAL, CKMB, CKMBINDEX, TROPONINI in the last 168 hours. BNP: Invalid input(s): POCBNP CBG: No results for input(s): GLUCAP in the last 168 hours. D-Dimer No results for input(s): DDIMER in the last 72 hours. Hgb A1c No results for input(s): HGBA1C in the last 72 hours. Lipid Profile  Recent Labs  07/25/16 0607  CHOL 231*  HDL 124    LDLCALC 90  TRIG 86  CHOLHDL 1.9   Thyroid function studies  Recent Labs  07/25/16 0607  TSH 1.566   Anemia work up No results for input(s): VITAMINB12, FOLATE, FERRITIN, TIBC, IRON, RETICCTPCT in the last 72 hours. Urinalysis    Component Value Date/Time   COLORURINE YELLOW 07/16/2016 0210   APPEARANCEUR CLEAR 07/16/2016 0210   LABSPEC 1.019 07/16/2016 0210   PHURINE 5.0 07/16/2016 0210   GLUCOSEU NEGATIVE 07/16/2016 0210   HGBUR NEGATIVE 07/16/2016 0210   HGBUR negative 04/30/2010 1020   BILIRUBINUR NEGATIVE 07/16/2016 0210   KETONESUR NEGATIVE 07/16/2016  0210   PROTEINUR NEGATIVE 07/16/2016 0210   UROBILINOGEN 0.2 03/15/2015 0508   NITRITE NEGATIVE 07/16/2016 0210   LEUKOCYTESUR NEGATIVE 07/16/2016 0210     Microbiology No results found for this or any previous visit (from the past 240 hour(s)).     Inpatient Medications:   Scheduled Meds: . chlorhexidine  15 mL Mouth/Throat QID  . famotidine  20 mg Oral BID  . fluticasone  2 spray Each Nare Daily  . gabapentin  300 mg Oral TID  . haloperidol  5 mg Oral BID  . hydrocerin   Topical BID  . lactose free nutrition  237 mL Oral TID WC  . lipase/protease/amylase  24,000 Units Oral TID AC  . loratadine  10 mg Oral Daily  . metoprolol tartrate  25 mg Oral BID  . multivitamin with minerals  1 tablet Oral Daily  . naltrexone  50 mg Oral Daily  . pantoprazole  40 mg Oral BID  . sertraline  75 mg Oral Daily  . sucralfate  1 g Oral TID WC & HS  . thiamine  100 mg Oral Daily   Continuous Infusions:   Radiological Exams on Admission: No results found.  Impression/Recommendations Principal Problem:   Major depressive disorder, recurrent severe without psychotic features ([place]) Active Problems:   Alcohol use disorder, severe, dependence ([facility])   Cocaine use disorder, severe, dependence ([facility])  Abnormal EKG :  No change in EKG when compared to the earlier EKG's.  No chest pain or sob or palpitations.  No need  for troponins at this time.  Can repeat Echocardiogram in am for further evaluation.  No change in medications.    Thank you for this consultation.  Our [facility] hospitalist team will follow the patient with you.   Time Spent: 30 minutes.    Tonya Carlile M.D. [facility] 07/25/2016, 8:32 PM

## 2016-07-25 NOTE — Consult Note (Signed)
Paged for a consult: for abnormal EKG results. on 07/25/2016 @ 10:33 am.

## 2016-07-25 NOTE — Progress Notes (Signed)
Nursing Note 07/25/2016 [id number]  Data Reports sleeping fair with PRN sleep med.  Rates depression 6/10, hopelessness 0/10, and anxiety 9/10. Affect blunted but appropriate.  Denies HI, SI, AVH.  C/O ongoing chronic abdominal pain/nausea. No acting out behaviors observed today, no interpersonal conflict observed.  SRA, medications, scripts all finished on chart.  Action Spoke with patient 1:1, nurse offered support to patient throughout shift.  Reviewed discharge instructions with patient, to leave via taxi straight to [facility].  Abdominal pain/ nausea treated with PRN's per MAR.  Continues to be monitored on 15 minute checks for safety.  Response PRN's effective.  Appropriate and polite with RN and peers.

## 2016-07-25 NOTE — Progress Notes (Signed)
  D: Pt informed the writer that this visitor was his fiance. Stated they plan to get married in July of this year. Pt informed the writer that he was due to get an echo cardiogram tomorrow. However, Probation officer didn't see mention of this in the chart. Also pt stated, "I was hoping to get it in here instead of out there because of the money".  Informed writer that I would continue to read the chart. Pt had also given his visitor a bag of belongings. Writer went thru the bag and removed 2 rolls of toilet tissue and 1 box of kleenex. Pt has no other questions or concerns.    A:  Support and encouragement was offered. 15 min checks continued for safety.  R: Pt remains safe.

## 2016-07-26 LAB — HEMOGLOBIN A1C
HEMOGLOBIN A1C: 4.6 % — AB (ref 4.8–5.6)
MEAN PLASMA GLUCOSE: 85 mg/dL

## 2016-07-26 NOTE — Progress Notes (Signed)
Pt acknowledged understanding of the discharge instructions. Pt was given medications as well as avs, sra, and transition form. Belongings were returned. Pt denied SI and HI, was given breakfast and escorted to the lounge. Pt left by [employer] cab at apprx [phone]

## 2016-07-29 ENCOUNTER — Emergency Department (HOSPITAL_COMMUNITY)
Admission: EM | Admit: 2016-07-29 | Discharge: 2016-07-29 | Disposition: A | Discharge status: Home / Self Care | Payer: Self-pay | Attending: Emergency Medicine | Admitting: Emergency Medicine

## 2016-07-29 ENCOUNTER — Encounter (HOSPITAL_COMMUNITY): Payer: Self-pay | Admitting: Emergency Medicine

## 2016-07-29 DIAGNOSIS — I10 Essential (primary) hypertension: Secondary | ICD-10-CM | POA: Insufficient documentation

## 2016-07-29 DIAGNOSIS — R109 Unspecified abdominal pain: Secondary | ICD-10-CM

## 2016-07-29 DIAGNOSIS — Z79899 Other long term (current) drug therapy: Secondary | ICD-10-CM | POA: Insufficient documentation

## 2016-07-29 DIAGNOSIS — G8929 Other chronic pain: Secondary | ICD-10-CM | POA: Insufficient documentation

## 2016-07-29 DIAGNOSIS — R1084 Generalized abdominal pain: Secondary | ICD-10-CM | POA: Insufficient documentation

## 2016-07-29 DIAGNOSIS — F17210 Nicotine dependence, cigarettes, uncomplicated: Secondary | ICD-10-CM | POA: Insufficient documentation

## 2016-07-29 DIAGNOSIS — J45909 Unspecified asthma, uncomplicated: Secondary | ICD-10-CM | POA: Insufficient documentation

## 2016-07-29 LAB — COMPREHENSIVE METABOLIC PANEL
ALK PHOS: 80 U/L (ref 38–126)
ALT: 50 U/L (ref 17–63)
ANION GAP: 11 (ref 5–15)
AST: 54 U/L — ABNORMAL HIGH (ref 15–41)
Albumin: 3.3 g/dL — ABNORMAL LOW (ref 3.5–5.0)
BUN: <5 mg/dL — ABNORMAL LOW (ref 6–20)
CALCIUM: 8.5 mg/dL — AB (ref 8.9–10.3)
CHLORIDE: 108 mmol/L (ref 101–111)
CO2: 20 mmol/L — AB (ref 22–32)
CREATININE: 0.66 mg/dL (ref 0.61–1.24)
GFR calc Af Amer: >60 mL/min (ref >60)
Glucose, Bld: 89 mg/dL (ref 65–99)
Potassium: 4.3 mmol/L (ref 3.5–5.1)
SODIUM: 139 mmol/L (ref 135–145)
Total Bilirubin: 0.5 mg/dL (ref 0.3–1.2)
Total Protein: 6.4 g/dL — ABNORMAL LOW (ref 6.5–8.1)

## 2016-07-29 LAB — URINALYSIS, ROUTINE W REFLEX MICROSCOPIC
Bilirubin Urine: NEGATIVE
Glucose, UA: NEGATIVE mg/dL
HGB URINE DIPSTICK: NEGATIVE
Ketones, ur: NEGATIVE mg/dL
LEUKOCYTES UA: NEGATIVE
Nitrite: NEGATIVE
PROTEIN: NEGATIVE mg/dL
SPECIFIC GRAVITY, URINE: 1.021 (ref 1.005–1.030)
pH: 5 (ref 5.0–8.0)

## 2016-07-29 LAB — CBC
HCT: 30.2 % — ABNORMAL LOW (ref 39.0–52.0)
HEMOGLOBIN: 9.8 g/dL — AB (ref 13.0–17.0)
MCH: 28.9 pg (ref 26.0–34.0)
MCHC: 32.5 g/dL (ref 30.0–36.0)
MCV: 89.1 fL (ref 78.0–100.0)
PLATELETS: 253 10*3/uL (ref 150–400)
RBC: 3.39 MIL/uL — AB (ref 4.22–5.81)
RDW: 15.3 % (ref 11.5–15.5)
WBC: 7.6 10*3/uL (ref 4.0–10.5)

## 2016-07-29 LAB — LIPASE, BLOOD: LIPASE: 24 U/L (ref 11–51)

## 2016-07-29 MED ORDER — GI COCKTAIL ~~LOC~~
30.0000 mL | Freq: Once | ORAL | Status: AC
  Administered 2016-07-29: 30 mL via ORAL
  Filled 2016-07-29: qty 30

## 2016-07-29 MED ORDER — KETOROLAC TROMETHAMINE 60 MG/2ML IM SOLN
60.0000 mg | Freq: Once | INTRAMUSCULAR | Status: AC
  Administered 2016-07-29: 60 mg via INTRAMUSCULAR
  Filled 2016-07-29: qty 2

## 2016-07-29 NOTE — ED Provider Notes (Signed)
[facility] DEPT Provider Note   CSN: [id number] Arrival date & time: 07/29/16  0236   By signing my name below, I, Delton Prairie, attest that this documentation has been prepared under the direction and in the presence of Everlene Balls, MD  Electronically Signed: Delton Prairie, ED Scribe. 07/29/16. 3:42 AM.   History   Chief Complaint Chief Complaint  Patient presents with  . Abdominal Pain    Pancreatitis   HPI Comments:  [name] is a 46 y.o. male, with a PMHx of pancreatitis on Creon and Carafate, who presents to the Emergency Department, via EMS, complaining of acute onset abdominal pain x 12 AM today. He also reports nausea and vomiting. Pt states he ate mashed potatoes, meatloaf and spinach which he thinks caused his symptoms. He states he also had beer yesterday. No alleviating factors noted. Pt denies difficulty urinating, diarrhea or any other associated symptoms. No other complaints noted.   The history is provided by the patient. No language interpreter was used.    Past Medical History:  Diagnosis Date  . Alcoholism /alcohol abuse ([place])   . Anemia   . Anxiety   . Arthritis    "knees; arms; elbows" (03/26/2015)  . Asthma   . Bipolar disorder ([place])   . Chronic bronchitis ([place])   . Chronic lower back pain   . Cocaine abuse   . Depression   . Family history of adverse reaction to anesthesia    "grandmother gets confused"  . Femoral condyle fracture ([place]) 03/08/2014   left medial/notes 03/09/2014  . GERD (gastroesophageal reflux disease)   . H/O hiatal hernia   . H/O suicide attempt 10/2012  . Heart murmur    "when he was little" (03/06/2013)  . High cholesterol   . History of blood transfusion 10/2012   "when I tried to commit suicide"  . History of stomach ulcers   . Hypertension   . Marijuana abuse, continuous   . Migraine    "a few times/year" (03/26/2015)  . Pancreatitis   . Pneumonia 1990's X 3  . PTSD (post-traumatic stress disorder)     . Shortness of breath    "can happen at anytime" (03/06/2013)  . Sickle cell trait ([place])   . WPW (Wolff-Parkinson-White syndrome)    [name] 03/06/2013    Patient Active Problem List   Diagnosis Date Noted  . Alcohol use disorder, severe, dependence ([place]) 07/25/2016  . Cocaine use disorder, severe, dependence ([place]) 07/25/2016  . Major depressive disorder, recurrent severe without psychotic features ([place]) 07/20/2016  . Leukocytosis   . Hospital acquired PNA 05/20/2015  . Pancreatitis 05/18/2015  . Pseudocyst of pancreas 05/18/2015  . Malnutrition of moderate degree 03/27/2015  . Abnormal transaminases 03/26/2015  . Polysubstance abuse (tobacco, cocaine, THC, and ETOH) 03/26/2015  . Acute pancreatitis 03/26/2015  . Alcohol-induced chronic pancreatitis ([place])   . Essential hypertension 02/06/2014  . Alcohol-induced acute pancreatitis 11/28/2013  . Pancreatic pseudocyst/cyst 11/25/2013  . Alcohol dependence ([place]) 10/23/2013  . MDD (major depressive disorder), recurrent severe, without psychosis ([place]) 10/22/2013  . Protein-calorie malnutrition, severe ([place]) 10/10/2013  . Suicide attempt 10/08/2013  . 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 TOBACCO ABUSE 03/23/2007    Past Surgical History:  Procedure Laterality Date  . CARDIAC CATHETERIZATION    . EYE SURGERY Left 1990's   "result of trauma"   . FACIAL FRACTURE SURGERY Left 1990's   "result of trauma"   . FRACTURE SURGERY    .  HERNIA REPAIR    . LEFT HEART CATHETERIZATION WITH CORONARY ANGIOGRAM Right 03/07/2013   Procedure: LEFT HEART CATHETERIZATION WITH CORONARY ANGIOGRAM;  Surgeon: [name], MD;  Location: [facility];  Service: Cardiovascular;  Laterality: Right;  . UMBILICAL HERNIA REPAIR      Home Medications    Prior to Admission medications   Medication Sig Start Date End Date Taking? Authorizing Provider  albuterol (PROVENTIL HFA;VENTOLIN HFA) 108 (90 Base) MCG/ACT inhaler  Inhale 2 puffs into the lungs every 6 (six) hours as needed for wheezing or shortness of breath. 07/25/16   Encarnacion Slates, NP  benzocaine (ORAJEL) 10 % mucosal gel Use as directed in the mouth or throat 4 (four) times daily as needed for mouth pain. 07/25/16   Encarnacion Slates, NP  cholestyramine (QUESTRAN) 4 g packet Take 1 packet (4 g total) by mouth 3 (three) times daily as needed (diarrhea). 07/25/16   Encarnacion Slates, NP  famotidine (PEPCID) 20 MG tablet Take 1 tablet (20 mg total) by mouth 2 (two) times daily. For acid reflux 07/25/16   Encarnacion Slates, NP  fluticasone (FLONASE) 50 MCG/ACT nasal spray Place 1 spray into both nostrils daily as needed for rhinitis. 07/25/16   Encarnacion Slates, NP  gabapentin (NEURONTIN) 300 MG capsule Take 1 capsule (300 mg total) by mouth 3 (three) times daily. For agitation 07/25/16   Encarnacion Slates, NP  haloperidol (HALDOL) 5 MG tablet Take 1 tablet (5 mg total) by mouth 2 (two) times daily. For mood control 07/25/16   Encarnacion Slates, NP  hydrocerin (EUCERIN) CREA Apply 1 application topically 2 (two) times daily. For dry skin 07/25/16   Encarnacion Slates, NP  hydrOXYzine (ATARAX/VISTARIL) 25 MG tablet Take 1 tablet (25 mg total) by mouth every 6 (six) hours as needed for anxiety. 07/25/16   Encarnacion Slates, NP  lipase/protease/amylase (CREON) 12000 units CPEP capsule Take 2 capsules (24,000 Units total) by mouth 3 (three) times daily with meals. For pancreatitis 07/25/16   Encarnacion Slates, NP  loratadine (CLARITIN) 10 MG tablet Take 1 tablet (10 mg total) by mouth daily. (May purchase from over the counter): For allergies 07/26/16   Encarnacion Slates, NP  metoprolol tartrate (LOPRESSOR) 25 MG tablet Take 1 tablet (25 mg total) by mouth 2 (two) times daily. For high blood pressure 07/25/16   Encarnacion Slates, NP  naltrexone (DEPADE) 50 MG tablet Take 1 tablet (50 mg total) by mouth daily. For alcoholism 07/26/16   Encarnacion Slates, NP  nicotine polacrilex (NICORETTE) 2 MG gum Take 1 each (2 mg total) by  mouth as needed for smoking cessation. 07/25/16   Encarnacion Slates, NP  ondansetron (ZOFRAN) 4 MG tablet Take 1 tablet (4 mg total) by mouth every 8 (eight) hours as needed for nausea or vomiting. 07/25/16   Encarnacion Slates, NP  pantoprazole (PROTONIX) 20 MG tablet Take 2 tablets (40 mg total) by mouth daily. For acid reflux 07/25/16   Encarnacion Slates, NP  sertraline (ZOLOFT) 25 MG tablet Take 3 tablets (75 mg total) by mouth daily. For depression 07/26/16   Encarnacion Slates, NP  sucralfate (CARAFATE) 1 g tablet Take 1 tablet (1 g total) by mouth 4 (four) times daily -  with meals and at bedtime. For acid reflux 07/25/16   Encarnacion Slates, NP    Family History Family History  Problem Relation Age of Onset  . Hypertension Other   .  Coronary artery disease Other     Social History Social History  Substance Use Topics  . Smoking status: Current Every Day Smoker    Packs/day: 1.50    Years: 30.00    Types: Cigarettes  . Smokeless tobacco: Current User    Types: Chew  . Alcohol use 7.2 oz/week    12 Cans of beer per week     Allergies   Shellfish-derived products; Trazodone and nefazodone; Robaxin [methocarbamol]; and Reglan [metoclopramide]   Review of Systems Review of Systems 10 Systems reviewed and are negative for acute change except as noted in the HPI.  Physical Exam Updated Vital Signs BP 127/81 (BP Location: Left Arm)   Pulse 94   Temp 97.9 F (36.6 C) (Oral)   Resp 18   SpO2 100%   Physical Exam  Constitutional: He is oriented to person, place, and time. Vital signs are normal. He appears well-developed and well-nourished.  Non-toxic appearance. He does not appear ill. No distress.  HENT:  Head: Normocephalic and atraumatic.  Nose: Nose normal.  Mouth/Throat: Oropharynx is clear and moist. No oropharyngeal exudate.  Eyes: Conjunctivae and EOM are normal. Pupils are equal, round, and reactive to light. No scleral icterus.  Neck: Normal range of motion. Neck supple. No tracheal  deviation, no edema, no erythema and normal range of motion present. No thyroid mass and no thyromegaly present.  Cardiovascular: Normal rate, regular rhythm, S1 normal, S2 normal, normal heart sounds, intact distal pulses and normal pulses.  Exam reveals no gallop and no friction rub.   No murmur heard. Pulmonary/Chest: Effort normal and breath sounds normal. No respiratory distress. He has no wheezes. He has no rhonchi. He has no rales.  Abdominal: Soft. Normal appearance and bowel sounds are normal. He exhibits no distension, no ascites and no mass. There is no hepatosplenomegaly. There is no tenderness. There is no rebound, no guarding and no CVA tenderness.  Musculoskeletal: Normal range of motion. He exhibits no edema or tenderness.  Lymphadenopathy:    He has no cervical adenopathy.  Neurological: He is alert and oriented to person, place, and time. He has normal strength. No cranial nerve deficit or sensory deficit.  Skin: Skin is warm, dry and intact. No petechiae and no rash noted. He is not diaphoretic. No erythema. No pallor.  Nursing note and vitals reviewed.  ED Treatments / Results  DIAGNOSTIC STUDIES:  Oxygen Saturation is 100% on RA, normal by my interpretation.    COORDINATION OF CARE:  3:42 AM Discussed treatment plan with pt at bedside and pt agreed to plan.  Labs (all labs ordered are listed, but only abnormal results are displayed) Labs Reviewed  CBC - Abnormal; Notable for the following:       Result Value   RBC 3.39 (*)    Hemoglobin 9.8 (*)    HCT 30.2 (*)    All other components within normal limits  URINALYSIS, ROUTINE W REFLEX MICROSCOPIC  LIPASE, BLOOD  COMPREHENSIVE METABOLIC PANEL    EKG  EKG Interpretation None       Radiology No results found.  Procedures Procedures (including critical care time)  Medications Ordered in ED Medications - No data to display   Initial Impression / Assessment and Plan / ED Course  I have reviewed the  triage vital signs and the nursing notes.  Pertinent labs & imaging results that were available during my care of the patient were reviewed by me and considered in my medical decision making (see  chart for details).     Patient presents to the ED for abdominal pain.  He has been here multiple times for this in the past. He states it is his pancreatitis.  Lipase is 24.  He was given toradol and GI cocktail.  Advised to see PCP for his chronic abdominal pain. VS are normal. Patient safe for DC.     Final Clinical Impressions(s) / ED Diagnoses   Final diagnoses:  Chronic abdominal pain    New Prescriptions New Prescriptions   No medications on file  I personally performed the services described in this documentation, which was scribed in my presence. The recorded information has been reviewed and is accurate.       Everlene Balls, MD 07/29/16 [phone]

## 2016-07-29 NOTE — ED Triage Notes (Addendum)
Patient arrived with EMS from home reports pancreatitis flare up this evening - generalized abdominal pain with emesis , denies fever or diarrhea .

## 2016-07-30 IMAGING — CR DG ABDOMEN ACUTE W/ 1V CHEST
3 series · 3 of 3 positions shown · non-contrast
Comparison: 01/30/2014

CLINICAL DATA: Epigastric and LEFT abdominal pain

EXAM:
ACUTE ABDOMEN SERIES (ABDOMEN 2 VIEW & CHEST 1 VIEW)

[w chest pa]
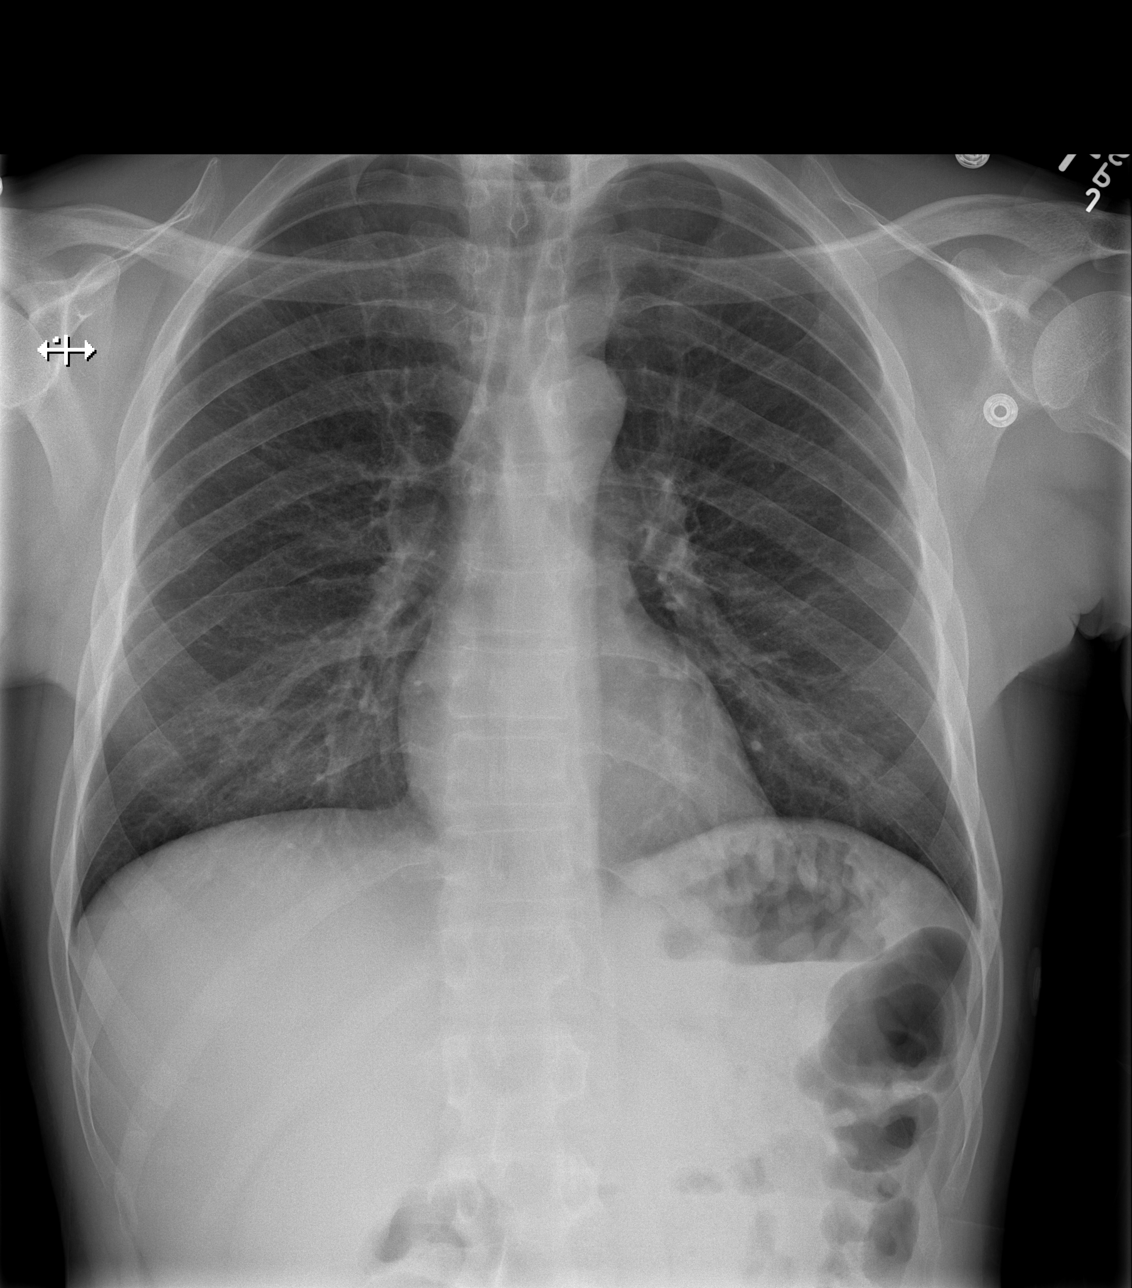

[w abdomen upright]
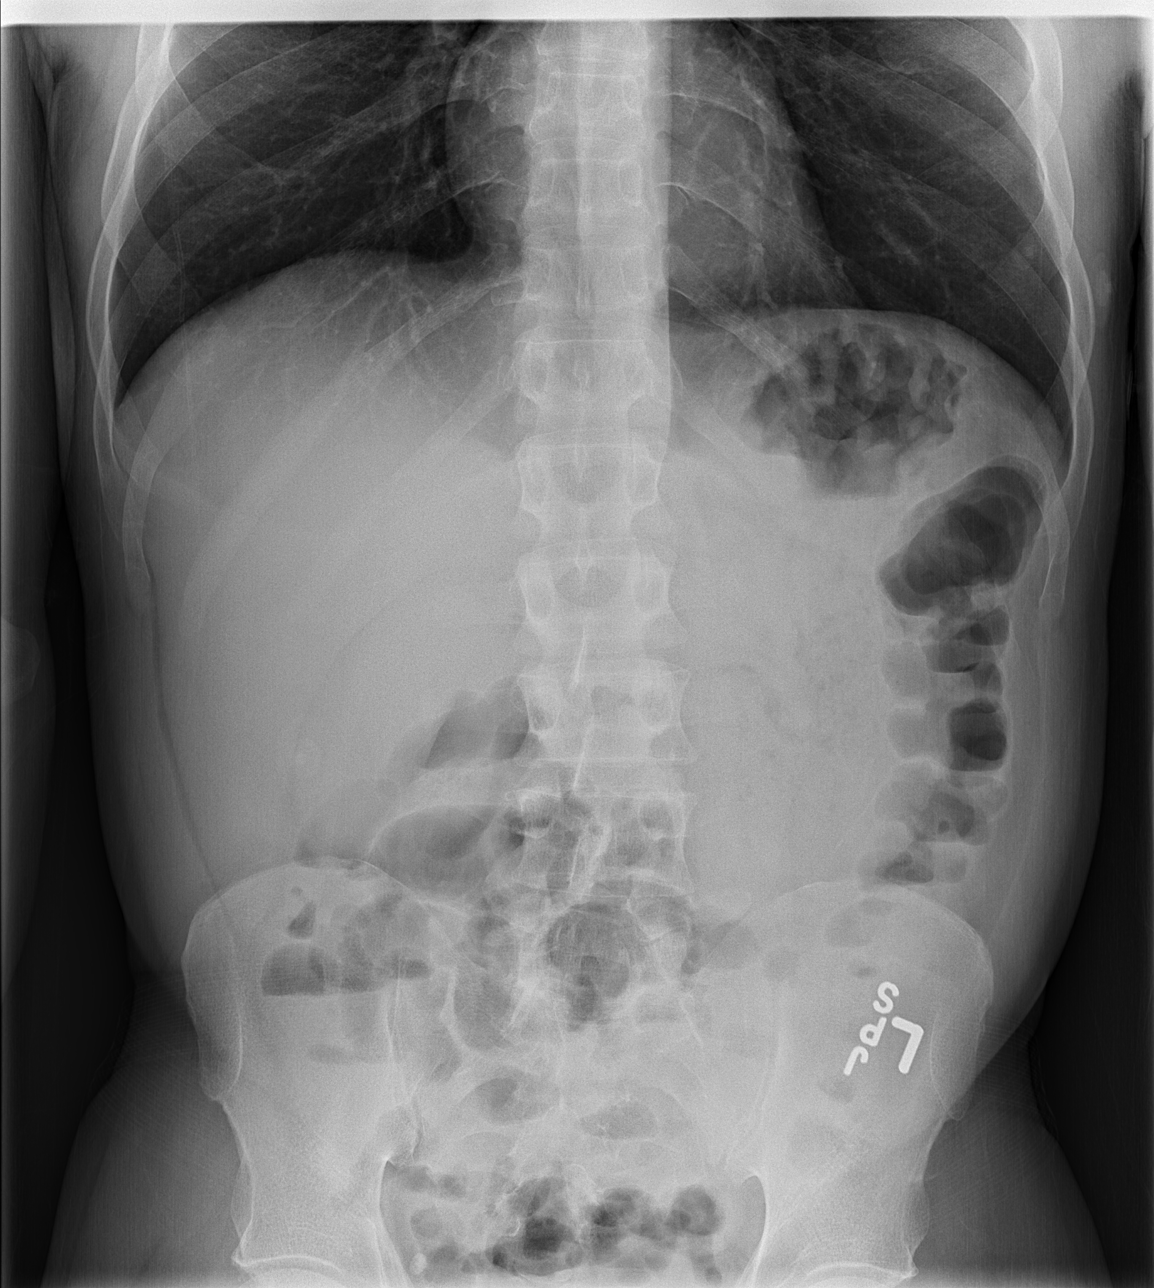

[t abdomen supine]
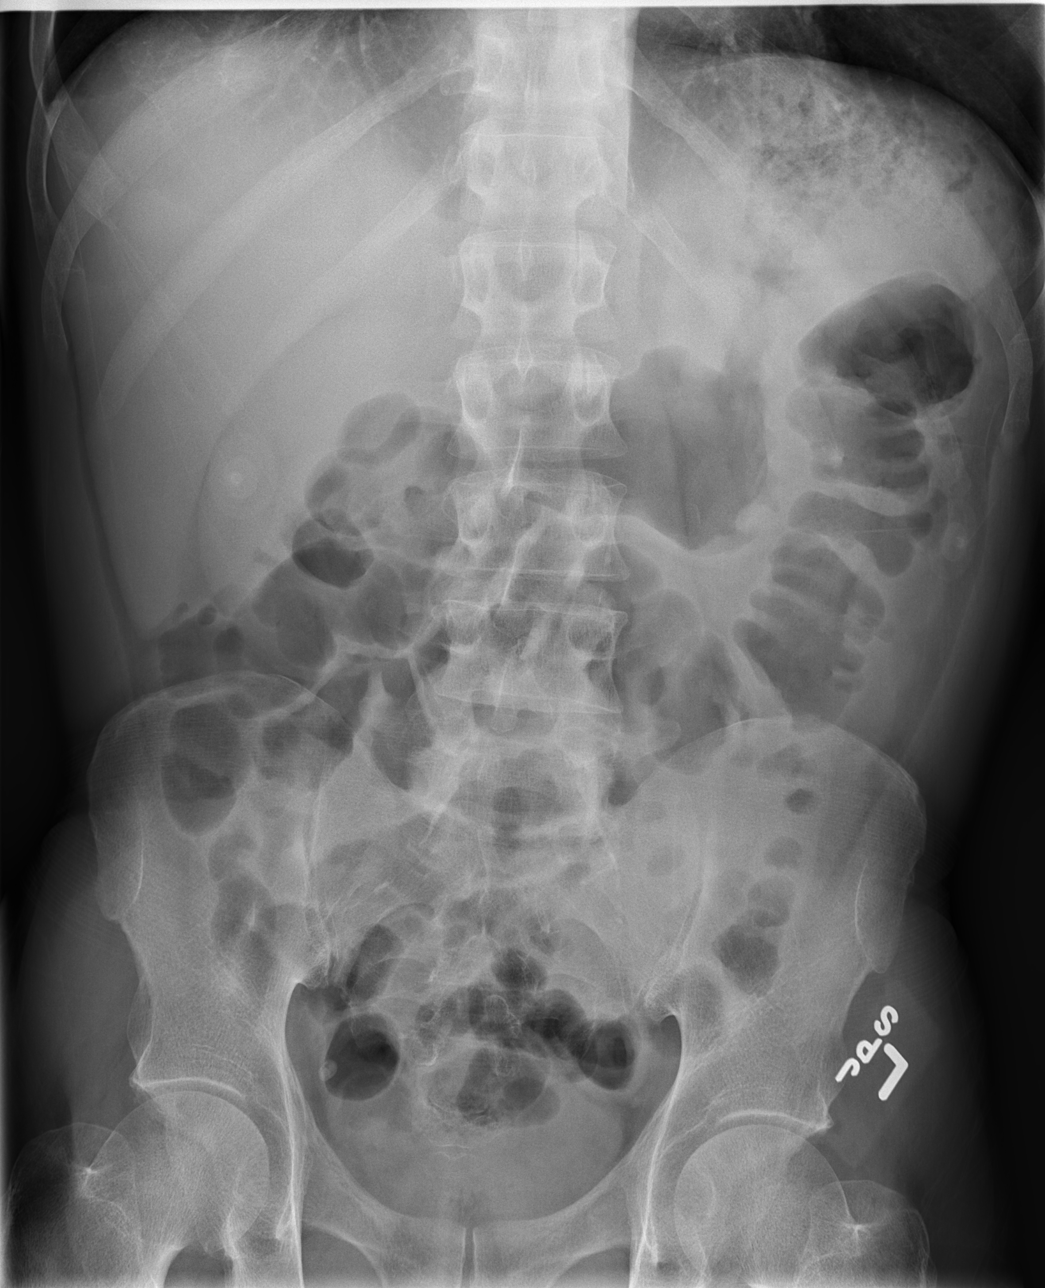

[3 of 3 positions shown; findings below may reference images not displayed]

FINDINGS: Normal heart size, mediastinal contours, and pulmonary vascularity.

Lungs clear.

No pleural effusion or pneumothorax.

Nonobstructive bowel gas pattern.

No bowel dilatation or bowel wall thickening or free intraperitoneal
air.

Bones unremarkable.

No urinary tract calcification.
IMPRESSION: Normal exam.

## 2016-08-04 ENCOUNTER — Emergency Department (HOSPITAL_COMMUNITY)
Admission: EM | Admit: 2016-08-04 | Discharge: 2016-08-04 | Disposition: A | Discharge status: Home / Self Care | Payer: Self-pay | Attending: Emergency Medicine | Admitting: Emergency Medicine

## 2016-08-04 ENCOUNTER — Encounter (HOSPITAL_COMMUNITY): Payer: Self-pay | Admitting: Emergency Medicine

## 2016-08-04 ENCOUNTER — Encounter (HOSPITAL_BASED_OUTPATIENT_CLINIC_OR_DEPARTMENT_OTHER): Payer: Self-pay | Admitting: Emergency Medicine

## 2016-08-04 ENCOUNTER — Emergency Department (HOSPITAL_BASED_OUTPATIENT_CLINIC_OR_DEPARTMENT_OTHER)
Admission: EM | Admit: 2016-08-04 | Discharge: 2016-08-04 | Disposition: A | Discharge status: Home / Self Care | Payer: Self-pay | Attending: Emergency Medicine | Admitting: Emergency Medicine

## 2016-08-04 DIAGNOSIS — I10 Essential (primary) hypertension: Secondary | ICD-10-CM | POA: Insufficient documentation

## 2016-08-04 DIAGNOSIS — J45909 Unspecified asthma, uncomplicated: Secondary | ICD-10-CM | POA: Insufficient documentation

## 2016-08-04 DIAGNOSIS — R102 Pelvic and perineal pain: Secondary | ICD-10-CM | POA: Insufficient documentation

## 2016-08-04 DIAGNOSIS — F17220 Nicotine dependence, chewing tobacco, uncomplicated: Secondary | ICD-10-CM | POA: Insufficient documentation

## 2016-08-04 DIAGNOSIS — F17210 Nicotine dependence, cigarettes, uncomplicated: Secondary | ICD-10-CM | POA: Insufficient documentation

## 2016-08-04 DIAGNOSIS — R1032 Left lower quadrant pain: Secondary | ICD-10-CM | POA: Insufficient documentation

## 2016-08-04 LAB — URINALYSIS, ROUTINE W REFLEX MICROSCOPIC
Bilirubin Urine: NEGATIVE
GLUCOSE, UA: NEGATIVE mg/dL
Hgb urine dipstick: NEGATIVE
Ketones, ur: NEGATIVE mg/dL
LEUKOCYTES UA: NEGATIVE
Nitrite: NEGATIVE
PROTEIN: NEGATIVE mg/dL
SPECIFIC GRAVITY, URINE: 1.006 (ref 1.005–1.030)
pH: 6 (ref 5.0–8.0)

## 2016-08-04 MED ORDER — GI COCKTAIL ~~LOC~~
30.0000 mL | Freq: Once | ORAL | Status: AC
  Administered 2016-08-04: 30 mL via ORAL
  Filled 2016-08-04: qty 30

## 2016-08-04 MED ORDER — KETOROLAC TROMETHAMINE 30 MG/ML IJ SOLN
30.0000 mg | Freq: Once | INTRAMUSCULAR | Status: AC
  Administered 2016-08-04: 30 mg via INTRAVENOUS
  Filled 2016-08-04: qty 1

## 2016-08-04 MED ORDER — NAPROXEN 375 MG PO TABS: 375.0000 mg | ORAL_TABLET | Freq: Two times a day (BID) | ORAL | 0 refills | Status: DC

## 2016-08-04 MED ORDER — IBUPROFEN 400 MG PO TABS
600.0000 mg | ORAL_TABLET | Freq: Once | ORAL | Status: AC
  Administered 2016-08-04: 03:00:00 600 mg via ORAL
  Filled 2016-08-04: qty 1

## 2016-08-04 NOTE — Discharge Instructions (Signed)
Follow up with your primary care doctor in 3 days.   Take medication as prescribed.   Return to the Emergency Department if you experience any worsening pain, bleeding when urinating, fever, nausea/vomiting or any worsening or concerning symptoms.

## 2016-08-04 NOTE — ED Triage Notes (Signed)
PT c/o groin pain that started yesterday.  Pain from the tip of his penis to lt groin.  Pt states "that african doctor discharged me after like 10 minutes. I don't trust him."  States no discharge, one sexual partner.

## 2016-08-04 NOTE — ED Notes (Signed)
Bed: YV85 Expected date:  Expected time:  Means of arrival:  Comments: EMS 46 y/o groin pain

## 2016-08-04 NOTE — ED Provider Notes (Signed)
[facility] DEPT Provider Note   CSN: [id number] Arrival date & time: 08/04/16  1644     History   Chief Complaint Chief Complaint  Patient presents with  . Groin Pain    HPI [name] is a 46 y.o. male who presents with 1 day of worsening groin pain. Patient states that that pain starts in the middle portion of his penis and radiates to left groin and left side. He states that pain is 7/10 and describes it as a "sharp shooting pain." He reports some mild dysuria but denies any penile discharge. He states that pain is worse with movement. He has taken ibuprofen with no improvement of pain. He reports associated suprapubic abdominal pain. He reports one sexual partner, but denies any use of condoms. He has not history of any STDs. He was seen at [facility] earlier this afternoon for the same symptoms. UA was negative at that time and he was discharged with PCP follow-up. Patient came here because he felt like they didn't do enough workup at [facility].   The history is provided by the patient.    Past Medical History:  Diagnosis Date  . Alcoholism /alcohol abuse ([place])   . Anemia   . Anxiety   . Arthritis    "knees; arms; elbows" (03/26/2015)  . Asthma   . Bipolar disorder ([place])   . Chronic bronchitis ([place])   . Chronic lower back pain   . Cocaine abuse   . Depression   . Family history of adverse reaction to anesthesia    "grandmother gets confused"  . Femoral condyle fracture ([place]) 03/08/2014   left medial/notes 03/09/2014  . GERD (gastroesophageal reflux disease)   . H/O hiatal hernia   . H/O suicide attempt 10/2012  . Heart murmur    "when he was little" (03/06/2013)  . High cholesterol   . History of blood transfusion 10/2012   "when I tried to commit suicide"  . History of stomach ulcers   . Hypertension   . Marijuana abuse, continuous   . Migraine    "a few times/year" (03/26/2015)  . Pancreatitis   . Pneumonia 1990's X 3  . PTSD (post-traumatic  stress disorder)   . Shortness of breath    "can happen at anytime" (03/06/2013)  . Sickle cell trait ([place])   . WPW (Wolff-Parkinson-White syndrome)    [name] 03/06/2013    Patient Active Problem List   Diagnosis Date Noted  . Alcohol use disorder, severe, dependence ([place]) 07/25/2016  . Cocaine use disorder, severe, dependence ([place]) 07/25/2016  . Major depressive disorder, recurrent severe without psychotic features ([place]) 07/20/2016  . Leukocytosis   . Hospital acquired PNA 05/20/2015  . Pancreatitis 05/18/2015  . Pseudocyst of pancreas 05/18/2015  . Malnutrition of moderate degree 03/27/2015  . Abnormal transaminases 03/26/2015  . Polysubstance abuse (tobacco, cocaine, THC, and ETOH) 03/26/2015  . Acute pancreatitis 03/26/2015  . Alcohol-induced chronic pancreatitis ([place])   . Essential hypertension 02/06/2014  . Alcohol-induced acute pancreatitis 11/28/2013  . Pancreatic pseudocyst/cyst 11/25/2013  . Alcohol dependence ([place]) 10/23/2013  . MDD (major depressive disorder), recurrent severe, without psychosis ([place]) 10/22/2013  . Protein-calorie malnutrition, severe ([place]) 10/10/2013  . Suicide attempt 10/08/2013  . 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 TOBACCO ABUSE 03/23/2007    Past Surgical History:  Procedure Laterality Date  . CARDIAC CATHETERIZATION    . EYE SURGERY Left 1990's   "result of trauma"   . FACIAL FRACTURE SURGERY  Left 1990's   "result of trauma"   . FRACTURE SURGERY    . HERNIA REPAIR    . LEFT HEART CATHETERIZATION WITH CORONARY ANGIOGRAM Right 03/07/2013   Procedure: LEFT HEART CATHETERIZATION WITH CORONARY ANGIOGRAM;  Surgeon: [name], MD;  Location: [facility] CATH LAB;  Service: Cardiovascular;  Laterality: Right;  . UMBILICAL HERNIA REPAIR         Home Medications    Prior to Admission medications   Medication Sig Start Date End Date Taking? Authorizing Provider  famotidine (PEPCID) 20 MG tablet Take 1 tablet  (20 mg total) by mouth 2 (two) times daily. For acid reflux 07/25/16  Yes Encarnacion Slates, NP  fluticasone (FLONASE) 50 MCG/ACT nasal spray Place 1 spray into both nostrils daily as needed for rhinitis. 07/25/16  Yes Encarnacion Slates, NP  gabapentin (NEURONTIN) 300 MG capsule Take 1 capsule (300 mg total) by mouth 3 (three) times daily. For agitation 07/25/16  Yes Encarnacion Slates, NP  haloperidol (HALDOL) 5 MG tablet Take 1 tablet (5 mg total) by mouth 2 (two) times daily. For mood control 07/25/16  Yes Encarnacion Slates, NP  hydrocerin (EUCERIN) CREA Apply 1 application topically 2 (two) times daily. For dry skin 07/25/16  Yes Encarnacion Slates, NP  lipase/protease/amylase (CREON) 12000 units CPEP capsule Take 2 capsules (24,000 Units total) by mouth 3 (three) times daily with meals. For pancreatitis 07/25/16  Yes Encarnacion Slates, NP  metoprolol tartrate (LOPRESSOR) 25 MG tablet Take 1 tablet (25 mg total) by mouth 2 (two) times daily. For high blood pressure 07/25/16  Yes Encarnacion Slates, NP  naltrexone (DEPADE) 50 MG tablet Take 1 tablet (50 mg total) by mouth daily. For alcoholism 07/26/16  Yes Encarnacion Slates, NP  ondansetron (ZOFRAN) 4 MG tablet Take 1 tablet (4 mg total) by mouth every 8 (eight) hours as needed for nausea or vomiting. 07/25/16  Yes Encarnacion Slates, NP  oxyCODONE-acetaminophen (PERCOCET/ROXICET) 5-325 MG tablet TK 1 OR 2 TS PO Q  6 H PRF SEVERE PAIN 06/12/16  Yes Historical Provider, MD  pantoprazole (PROTONIX) 40 MG tablet Take 40 mg by mouth daily.    Yes Historical Provider, MD  sertraline (ZOLOFT) 25 MG tablet Take 3 tablets (75 mg total) by mouth daily. For depression 07/26/16  Yes Encarnacion Slates, NP  sucralfate (CARAFATE) 1 g tablet Take 1 tablet (1 g total) by mouth 4 (four) times daily -  with meals and at bedtime. For acid reflux 07/25/16  Yes Encarnacion Slates, NP  albuterol (PROVENTIL HFA;VENTOLIN HFA) 108 (90 Base) MCG/ACT inhaler Inhale 2 puffs into the lungs every 6 (six) hours as needed for wheezing or  shortness of breath. 07/25/16   Encarnacion Slates, NP  benzocaine (ORAJEL) 10 % mucosal gel Use as directed in the mouth or throat 4 (four) times daily as needed for mouth pain. 07/25/16   Encarnacion Slates, NP  cholestyramine (QUESTRAN) 4 g packet Take 1 packet (4 g total) by mouth 3 (three) times daily as needed (diarrhea). 07/25/16   Encarnacion Slates, NP  hydrOXYzine (ATARAX/VISTARIL) 25 MG tablet Take 1 tablet (25 mg total) by mouth every 6 (six) hours as needed for anxiety. 07/25/16   Encarnacion Slates, NP  loratadine (CLARITIN) 10 MG tablet Take 1 tablet (10 mg total) by mouth daily. (May purchase from over the counter): For allergies 07/26/16   Encarnacion Slates, NP  naproxen (NAPROSYN) 375 MG tablet Take 1  tablet (375 mg total) by mouth 2 (two) times daily. 08/04/16   Orson Aloe, PA  nicotine polacrilex (NICORETTE) 2 MG gum Take 1 each (2 mg total) by mouth as needed for smoking cessation. 07/25/16   Encarnacion Slates, NP  pantoprazole (PROTONIX) 20 MG tablet Take 2 tablets (40 mg total) by mouth daily. For acid reflux Patient not taking: Reported on 08/04/2016 07/25/16   Encarnacion Slates, NP    Family History Family History  Problem Relation Age of Onset  . Hypertension Other   . Coronary artery disease Other     Social History Social History  Substance Use Topics  . Smoking status: Current Every Day Smoker    Packs/day: 1.50    Years: 30.00    Types: Cigarettes  . Smokeless tobacco: Current User    Types: Chew  . Alcohol use 7.2 oz/week    12 Cans of beer per week     Allergies   Shellfish-derived products; Trazodone and nefazodone; Robaxin [methocarbamol]; and Reglan [metoclopramide]   Review of Systems Review of Systems  Constitutional: Negative for chills and fever.  HENT: Negative for congestion, rhinorrhea and sore throat.   Eyes: Negative for visual disturbance.  Respiratory: Negative for shortness of breath.   Cardiovascular: Negative for chest pain.  Gastrointestinal: Positive for  abdominal pain. Negative for diarrhea, nausea and vomiting.  Genitourinary: Positive for dysuria. Negative for discharge, flank pain, genital sores, hematuria, penile swelling and scrotal swelling.  Skin: Negative for rash.  Neurological: Negative for numbness and headaches.  All other systems reviewed and are negative.    Physical Exam Updated Vital Signs BP (!) 141/97 (BP Location: Left Arm)   Pulse 96   Temp 97.8 F (36.6 C) (Oral)   Resp 18   SpO2 100%   Physical Exam  Constitutional: He appears well-developed and well-nourished.  HENT:  Head: Normocephalic and atraumatic.  Eyes: Conjunctivae, EOM and lids are normal.  Neck: Full passive range of motion without pain.  Cardiovascular: Normal rate, regular rhythm and intact distal pulses.   Pulmonary/Chest: Effort normal and breath sounds normal.  Abdominal: Normal appearance. He exhibits no distension. There is tenderness in the suprapubic area. There is no CVA tenderness. Hernia confirmed negative in the right inguinal area and confirmed negative in the left inguinal area.  Genitourinary: Right testis shows no swelling and no tenderness. Left testis shows no swelling and no tenderness. No penile tenderness.  Genitourinary Comments: Exam done with Hassell Done (ED tech) present.  No penile or testicular swelling. No discharge or sores noted on the penis. No erythema or warmth to penis or testicles bilaterally.  Musculoskeletal: He exhibits no deformity.  Neurological: He is alert.  Skin: Skin is warm and dry.  Psychiatric: He has a normal mood and affect. His speech is normal and behavior is normal.     ED Treatments / Results  Labs (all labs ordered are listed, but only abnormal results are displayed) Labs Reviewed - No data to display  EKG  EKG Interpretation None       Radiology No results found.  Procedures Procedures (including critical care time)  Medications Ordered in ED Medications  ketorolac (TORADOL) 30  MG/ML injection 30 mg (not administered)  gi cocktail (Maalox,Lidocaine,Donnatal) (not administered)     Initial Impression / Assessment and Plan / ED Course  I have reviewed the triage vital signs and the nursing notes.  Pertinent labs & imaging results that were available during my care of the  patient were reviewed by me and considered in my medical decision making (see chart for details).    46 yo M presents with 1 day of groin pain. Associated with some intermittent dysuria and suprapubic abdominal pain. No penile discharge. No history of STDs. Was seen at [facility] earlier this afternoon for same symptoms. Came to [facility] ED because he didn't agree with workup at [facility]. Records reviewed. UA done at [facility] negative for any infection or presence of blood. Reassuring physical exam.   Records reviewed. UA was negative at [facility] ED. Patient has had multiple ED visits in the past. His last imaging on 07/16/16 when he had a CT abd/pelvis that did not show any kidney stones. Given reassuring physical, no indications for further workup at this time. [place] Substance Reporting System shows that patient last received Norco on 06/28/16.   Discussed plan with patient. He is still having pain. Will give him dose of Toradol in the ED and send him home with Naprosyn. Instructed patient to follow-up with his PCP in 3 days for follow-up. Return precautions discussed. Patient expresses understanding and agreement to plan.    Final Clinical Impressions(s) / ED Diagnoses   Final diagnoses:  Left inguinal pain    New Prescriptions New Prescriptions   NAPROXEN (NAPROSYN) 375 MG TABLET    Take 1 tablet (375 mg total) by mouth 2 (two) times daily.     [place], [name] 08/04/16 [place], MD 08/05/16 [phone]

## 2016-08-04 NOTE — ED Provider Notes (Signed)
[facility] DEPT [facility] Provider Note   CSN: [id number] Arrival date & time: 08/04/16  0003     History   Chief Complaint Chief Complaint  Patient presents with  . Groin Pain    HPI [name] is a 46 y.o. male.With a past medical history of alcohol abuse, bipolar disorder, cocaine abuse, chronic abdominal pain, presenting today with groin pain. He states it's been going on for the past 24 hours. Describes pain in his left testicle which radiates to his left thigh. He is concerned this may be a kidney stone. He denies any abdominal pain today. No nausea vomiting or diarrhea. No fevers. He has not taken any medication for this. He denies any discharge from his penis or dysuria. No recent unprotected sex with new partners. There are no further complaints.   10 Systems reviewed and are negative for acute change except as noted in the HPI.   HPI  Past Medical History:  Diagnosis Date  . Alcoholism /alcohol abuse ([place])   . Anemia   . Anxiety   . Arthritis    "knees; arms; elbows" (03/26/2015)  . Asthma   . Bipolar disorder ([place])   . Chronic bronchitis ([place])   . Chronic lower back pain   . Cocaine abuse   . Depression   . Family history of adverse reaction to anesthesia    "grandmother gets confused"  . Femoral condyle fracture ([place]) 03/08/2014   left medial/notes 03/09/2014  . GERD (gastroesophageal reflux disease)   . H/O hiatal hernia   . H/O suicide attempt 10/2012  . Heart murmur    "when he was little" (03/06/2013)  . High cholesterol   . History of blood transfusion 10/2012   "when I tried to commit suicide"  . History of stomach ulcers   . Hypertension   . Marijuana abuse, continuous   . Migraine    "a few times/year" (03/26/2015)  . Pancreatitis   . Pneumonia 1990's X 3  . PTSD (post-traumatic stress disorder)   . Shortness of breath    "can happen at anytime" (03/06/2013)  . Sickle cell trait ([place])   . WPW (Wolff-Parkinson-White syndrome)    [name] 03/06/2013    Patient Active Problem List   Diagnosis Date Noted  . Alcohol use disorder, severe, dependence ([place]) 07/25/2016  . Cocaine use disorder, severe, dependence ([place]) 07/25/2016  . Major depressive disorder, recurrent severe without psychotic features ([place]) 07/20/2016  . Leukocytosis   . Hospital acquired PNA 05/20/2015  . Pancreatitis 05/18/2015  . Pseudocyst of pancreas 05/18/2015  . Malnutrition of moderate degree 03/27/2015  . Abnormal transaminases 03/26/2015  . Polysubstance abuse (tobacco, cocaine, THC, and ETOH) 03/26/2015  . Acute pancreatitis 03/26/2015  . Alcohol-induced chronic pancreatitis ([place])   . Essential hypertension 02/06/2014  . Alcohol-induced acute pancreatitis 11/28/2013  . Pancreatic pseudocyst/cyst 11/25/2013  . Alcohol dependence ([place]) 10/23/2013  . MDD (major depressive disorder), recurrent severe, without psychosis ([place]) 10/22/2013  . Protein-calorie malnutrition, severe ([place]) 10/10/2013  . Suicide attempt 10/08/2013  . 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 TOBACCO ABUSE 03/23/2007    Past Surgical History:  Procedure Laterality Date  . CARDIAC CATHETERIZATION    . EYE SURGERY Left 1990's   "result of trauma"   . FACIAL FRACTURE SURGERY Left 1990's   "result of trauma"   . FRACTURE SURGERY    . HERNIA REPAIR    . LEFT HEART CATHETERIZATION WITH CORONARY ANGIOGRAM Right 03/07/2013   Procedure: LEFT  HEART CATHETERIZATION WITH CORONARY ANGIOGRAM;  Surgeon: [name], MD;  Location: [facility] CATH LAB;  Service: Cardiovascular;  Laterality: Right;  . UMBILICAL HERNIA REPAIR         Home Medications    Prior to Admission medications   Medication Sig Start Date End Date Taking? Authorizing Provider  albuterol (PROVENTIL HFA;VENTOLIN HFA) 108 (90 Base) MCG/ACT inhaler Inhale 2 puffs into the lungs every 6 (six) hours as needed for wheezing or shortness of breath. 07/25/16   Encarnacion Slates, NP    benzocaine (ORAJEL) 10 % mucosal gel Use as directed in the mouth or throat 4 (four) times daily as needed for mouth pain. 07/25/16   Encarnacion Slates, NP  cholestyramine (QUESTRAN) 4 g packet Take 1 packet (4 g total) by mouth 3 (three) times daily as needed (diarrhea). 07/25/16   Encarnacion Slates, NP  famotidine (PEPCID) 20 MG tablet Take 1 tablet (20 mg total) by mouth 2 (two) times daily. For acid reflux 07/25/16   Encarnacion Slates, NP  fluticasone (FLONASE) 50 MCG/ACT nasal spray Place 1 spray into both nostrils daily as needed for rhinitis. 07/25/16   Encarnacion Slates, NP  gabapentin (NEURONTIN) 300 MG capsule Take 1 capsule (300 mg total) by mouth 3 (three) times daily. For agitation 07/25/16   Encarnacion Slates, NP  haloperidol (HALDOL) 5 MG tablet Take 1 tablet (5 mg total) by mouth 2 (two) times daily. For mood control 07/25/16   Encarnacion Slates, NP  hydrocerin (EUCERIN) CREA Apply 1 application topically 2 (two) times daily. For dry skin 07/25/16   Encarnacion Slates, NP  hydrOXYzine (ATARAX/VISTARIL) 25 MG tablet Take 1 tablet (25 mg total) by mouth every 6 (six) hours as needed for anxiety. 07/25/16   Encarnacion Slates, NP  lipase/protease/amylase (CREON) 12000 units CPEP capsule Take 2 capsules (24,000 Units total) by mouth 3 (three) times daily with meals. For pancreatitis 07/25/16   Encarnacion Slates, NP  loratadine (CLARITIN) 10 MG tablet Take 1 tablet (10 mg total) by mouth daily. (May purchase from over the counter): For allergies 07/26/16   Encarnacion Slates, NP  metoprolol tartrate (LOPRESSOR) 25 MG tablet Take 1 tablet (25 mg total) by mouth 2 (two) times daily. For high blood pressure 07/25/16   Encarnacion Slates, NP  naltrexone (DEPADE) 50 MG tablet Take 1 tablet (50 mg total) by mouth daily. For alcoholism 07/26/16   Encarnacion Slates, NP  nicotine polacrilex (NICORETTE) 2 MG gum Take 1 each (2 mg total) by mouth as needed for smoking cessation. 07/25/16   Encarnacion Slates, NP  ondansetron (ZOFRAN) 4 MG tablet Take 1 tablet (4 mg  total) by mouth every 8 (eight) hours as needed for nausea or vomiting. 07/25/16   Encarnacion Slates, NP  pantoprazole (PROTONIX) 20 MG tablet Take 2 tablets (40 mg total) by mouth daily. For acid reflux 07/25/16   Encarnacion Slates, NP  sertraline (ZOLOFT) 25 MG tablet Take 3 tablets (75 mg total) by mouth daily. For depression 07/26/16   Encarnacion Slates, NP  sucralfate (CARAFATE) 1 g tablet Take 1 tablet (1 g total) by mouth 4 (four) times daily -  with meals and at bedtime. For acid reflux 07/25/16   Encarnacion Slates, NP    Family History Family History  Problem Relation Age of Onset  . Hypertension Other   . Coronary artery disease Other     Social History Social History  Substance Use Topics  . Smoking status: Current Every Day Smoker    Packs/day: 1.50    Years: 30.00    Types: Cigarettes  . Smokeless tobacco: Current User    Types: Chew  . Alcohol use 7.2 oz/week    12 Cans of beer per week     Allergies   Shellfish-derived products; Trazodone and nefazodone; Robaxin [methocarbamol]; and Reglan [metoclopramide]   Review of Systems Review of Systems   Physical Exam Updated Vital Signs BP (!) 144/107   Pulse 71   Temp 98.2 F (36.8 C) (Oral)   Resp 18   Ht 5\' 9"  (1.753 m)   Wt 149 lb (67.6 kg)   SpO2 100%   BMI 22.00 kg/m   Physical Exam  Constitutional: He is oriented to person, place, and time. Vital signs are normal. He appears well-developed and well-nourished.  Non-toxic appearance. He does not appear ill. No distress.  HENT:  Head: Normocephalic and atraumatic.  Nose: Nose normal.  Mouth/Throat: Oropharynx is clear and moist. No oropharyngeal exudate.  Eyes: Conjunctivae and EOM are normal. Pupils are equal, round, and reactive to light. No scleral icterus.  Neck: Normal range of motion. Neck supple. No tracheal deviation, no edema, no erythema and normal range of motion present. No thyroid mass and no thyromegaly present.  Cardiovascular: Normal rate, regular  rhythm, S1 normal, S2 normal, normal heart sounds, intact distal pulses and normal pulses.  Exam reveals no gallop and no friction rub.   No murmur heard. Pulmonary/Chest: Effort normal and breath sounds normal. No respiratory distress. He has no wheezes. He has no rhonchi. He has no rales.  Abdominal: Soft. Normal appearance and bowel sounds are normal. He exhibits no distension, no ascites and no mass. There is no hepatosplenomegaly. There is no tenderness. There is no rebound, no guarding and no CVA tenderness.  Genitourinary: Penis normal. No penile tenderness.  Genitourinary Comments: No swelling or tenderness in the penis or scrotum. No discharge seen.  Musculoskeletal: Normal range of motion. He exhibits no edema or tenderness.  Lymphadenopathy:    He has no cervical adenopathy.  Neurological: He is alert and oriented to person, place, and time. He has normal strength. No cranial nerve deficit or sensory deficit.  Skin: Skin is warm, dry and intact. No petechiae and no rash noted. He is not diaphoretic. No erythema. No pallor.  Nursing note and vitals reviewed.    ED Treatments / Results  Labs (all labs ordered are listed, but only abnormal results are displayed) Labs Reviewed  URINALYSIS, ROUTINE W REFLEX MICROSCOPIC    EKG  EKG Interpretation None       Radiology No results found.  Procedures Procedures (including critical care time)  Medications Ordered in ED Medications  ibuprofen (ADVIL,MOTRIN) tablet 600 mg (600 mg Oral Given 08/04/16 0319)     Initial Impression / Assessment and Plan / ED Course  I have reviewed the triage vital signs and the nursing notes.  Pertinent labs & imaging results that were available during my care of the patient were reviewed by me and considered in my medical decision making (see chart for details).     Patient presents to the ED for groin pain.  Physical exam is completely normal.  He does not appear in any acute distress and  ambulated in the room without difficulty. I could not feel any hernia.  Will send UA and give ibuprofen for pain.   I told him he will not receive any  narcotics tonight.  UA neg for blood or infection.  PCP fu advised within 3 days. VS remain within his normal limits. Patient safe for DC.  Final Clinical Impressions(s) / ED Diagnoses   Final diagnoses:  None    New Prescriptions New Prescriptions   No medications on file     Everlene Balls, MD 08/04/16 [phone]

## 2016-08-04 NOTE — ED Provider Notes (Signed)
Pt seen and examined.   I doubt pancreatitis.   Pt had a ct on 3/19  No stones.   Abdominal exam is benign.   Pt given torodol and gi cocktail.   Care plan reviewed.     [place], PA-C 08/04/16 1840

## 2016-08-04 NOTE — ED Notes (Signed)
Pt given crackers and peanut butter snack

## 2016-08-04 NOTE — ED Triage Notes (Signed)
c/o goin pain on left side that started this morning

## 2016-08-10 ENCOUNTER — Emergency Department (HOSPITAL_COMMUNITY)
Admission: EM | Admit: 2016-08-10 | Discharge: 2016-08-10 | Disposition: A | Discharge status: Home / Self Care | Payer: Self-pay | Attending: Emergency Medicine | Admitting: Emergency Medicine

## 2016-08-10 ENCOUNTER — Encounter (HOSPITAL_COMMUNITY): Payer: Self-pay | Admitting: *Deleted

## 2016-08-10 DIAGNOSIS — J45909 Unspecified asthma, uncomplicated: Secondary | ICD-10-CM | POA: Insufficient documentation

## 2016-08-10 DIAGNOSIS — R1013 Epigastric pain: Secondary | ICD-10-CM | POA: Insufficient documentation

## 2016-08-10 DIAGNOSIS — R109 Unspecified abdominal pain: Secondary | ICD-10-CM

## 2016-08-10 DIAGNOSIS — F17210 Nicotine dependence, cigarettes, uncomplicated: Secondary | ICD-10-CM | POA: Insufficient documentation

## 2016-08-10 DIAGNOSIS — G8929 Other chronic pain: Secondary | ICD-10-CM | POA: Insufficient documentation

## 2016-08-10 DIAGNOSIS — I10 Essential (primary) hypertension: Secondary | ICD-10-CM | POA: Insufficient documentation

## 2016-08-10 MED ORDER — GI COCKTAIL ~~LOC~~
30.0000 mL | Freq: Once | ORAL | Status: AC
  Administered 2016-08-10: 30 mL via ORAL
  Filled 2016-08-10: qty 30

## 2016-08-10 MED ORDER — FAMOTIDINE 20 MG PO TABS: 20.0000 mg | ORAL_TABLET | Freq: Two times a day (BID) | ORAL | 3 refills | Status: DC

## 2016-08-10 MED ORDER — SUCRALFATE 1 G PO TABS: 1.0000 g | ORAL_TABLET | Freq: Three times a day (TID) | ORAL | 3 refills | Status: DC

## 2016-08-10 MED ORDER — PANTOPRAZOLE SODIUM 20 MG PO TBEC: 20.0000 mg | DELAYED_RELEASE_TABLET | Freq: Every day | ORAL | 3 refills | Status: DC

## 2016-08-10 NOTE — ED Provider Notes (Signed)
[facility] DEPT Provider Note   CSN: [id number] Arrival date & time: 08/10/16  0751     History   Chief Complaint Chief Complaint  Patient presents with  . Abdominal Pain    HPI [name] is a 46 y.o. male.  HPI Patient reports severe pain in his epigastrium. Worse over the past 2 days. Patient reports that he ate beans and cornbread last night. He denies that he has had alcohol. He reports the pain feels like his pancreatitis but he has not been drinking. Pain is severe. Worse than usual. Patient reports that he has vomited multiple times overnight and had at least 3 episodes of diarrhea. No other associated symptoms. Patient initially reported that he was compliant with his GI medications. He reported he had taking Carafate. But he later advised that he had run out of his protonix and was about to run out of Pepcid. Past Medical History:  Diagnosis Date  . Alcoholism /alcohol abuse ([place])   . Anemia   . Anxiety   . Arthritis    "knees; arms; elbows" (03/26/2015)  . Asthma   . Bipolar disorder ([place])   . Chronic bronchitis ([place])   . Chronic lower back pain   . Cocaine abuse   . Depression   . Family history of adverse reaction to anesthesia    "grandmother gets confused"  . Femoral condyle fracture ([place]) 03/08/2014   left medial/notes 03/09/2014  . GERD (gastroesophageal reflux disease)   . H/O hiatal hernia   . H/O suicide attempt 10/2012  . Heart murmur    "when he was little" (03/06/2013)  . High cholesterol   . History of blood transfusion 10/2012   "when I tried to commit suicide"  . History of stomach ulcers   . Hypertension   . Marijuana abuse, continuous   . Migraine    "a few times/year" (03/26/2015)  . Pancreatitis   . Pneumonia 1990's X 3  . PTSD (post-traumatic stress disorder)   . Shortness of breath    "can happen at anytime" (03/06/2013)  . Sickle cell trait ([place])   . WPW (Wolff-Parkinson-White syndrome)    [name] 03/06/2013     Patient Active Problem List   Diagnosis Date Noted  . Alcohol use disorder, severe, dependence ([place]) 07/25/2016  . Cocaine use disorder, severe, dependence ([place]) 07/25/2016  . Major depressive disorder, recurrent severe without psychotic features ([place]) 07/20/2016  . Leukocytosis   . Hospital acquired PNA 05/20/2015  . Pancreatitis 05/18/2015  . Pseudocyst of pancreas 05/18/2015  . Malnutrition of moderate degree 03/27/2015  . Abnormal transaminases 03/26/2015  . Polysubstance abuse (tobacco, cocaine, THC, and ETOH) 03/26/2015  . Acute pancreatitis 03/26/2015  . Alcohol-induced chronic pancreatitis ([place])   . Essential hypertension 02/06/2014  . Alcohol-induced acute pancreatitis 11/28/2013  . Pancreatic pseudocyst/cyst 11/25/2013  . Alcohol dependence ([place]) 10/23/2013  . MDD (major depressive disorder), recurrent severe, without psychosis ([place]) 10/22/2013  . Protein-calorie malnutrition, severe ([place]) 10/10/2013  . Suicide attempt 10/08/2013  . 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 TOBACCO ABUSE 03/23/2007    Past Surgical History:  Procedure Laterality Date  . CARDIAC CATHETERIZATION    . EYE SURGERY Left 1990's   "result of trauma"   . FACIAL FRACTURE SURGERY Left 1990's   "result of trauma"   . FRACTURE SURGERY    . HERNIA REPAIR    . LEFT HEART CATHETERIZATION WITH CORONARY ANGIOGRAM Right 03/07/2013   Procedure: LEFT HEART CATHETERIZATION WITH CORONARY  ANGIOGRAM;  Surgeon: [name], MD;  Location[facility] C[facility] LAB;  Service: Cardiovascular;  Laterality: Right;  . UMBILICAL HERNIA REPAIR         Home Medications    Prior to Admission medications   Medication Sig Start Date End Date Taking? Authorizing Provider  albuterol (PROVENTIL HFA;VENTOLIN HFA) 108 (90 Base) MCG/ACT inhaler Inhale 2 puffs into the lungs every 6 (six) hours as needed for wheezing or shortness of breath. 07/25/16   Encarnacion Slates, NP  benzocaine (ORAJEL) 10 %  mucosal gel Use as directed in the mouth or throat 4 (four) times daily as needed for mouth pain. 07/25/16   Encarnacion Slates, NP  cholestyramine (QUESTRAN) 4 g packet Take 1 packet (4 g total) by mouth 3 (three) times daily as needed (diarrhea). 07/25/16   Encarnacion Slates, NP  famotidine (PEPCID) 20 MG tablet Take 1 tablet (20 mg total) by mouth 2 (two) times daily. For acid reflux 07/25/16   Encarnacion Slates, NP  fluticasone (FLONASE) 50 MCG/ACT nasal spray Place 1 spray into both nostrils daily as needed for rhinitis. 07/25/16   Encarnacion Slates, NP  gabapentin (NEURONTIN) 300 MG capsule Take 1 capsule (300 mg total) by mouth 3 (three) times daily. For agitation 07/25/16   Encarnacion Slates, NP  haloperidol (HALDOL) 5 MG tablet Take 1 tablet (5 mg total) by mouth 2 (two) times daily. For mood control 07/25/16   Encarnacion Slates, NP  hydrocerin (EUCERIN) CREA Apply 1 application topically 2 (two) times daily. For dry skin 07/25/16   Encarnacion Slates, NP  hydrOXYzine (ATARAX/VISTARIL) 25 MG tablet Take 1 tablet (25 mg total) by mouth every 6 (six) hours as needed for anxiety. 07/25/16   Encarnacion Slates, NP  lipase/protease/amylase (CREON) 12000 units CPEP capsule Take 2 capsules (24,000 Units total) by mouth 3 (three) times daily with meals. For pancreatitis 07/25/16   Encarnacion Slates, NP  loratadine (CLARITIN) 10 MG tablet Take 1 tablet (10 mg total) by mouth daily. (May purchase from over the counter): For allergies 07/26/16   Encarnacion Slates, NP  metoprolol tartrate (LOPRESSOR) 25 MG tablet Take 1 tablet (25 mg total) by mouth 2 (two) times daily. For high blood pressure 07/25/16   Encarnacion Slates, NP  naltrexone (DEPADE) 50 MG tablet Take 1 tablet (50 mg total) by mouth daily. For alcoholism 07/26/16   Encarnacion Slates, NP  naproxen (NAPROSYN) 375 MG tablet Take 1 tablet (375 mg total) by mouth 2 (two) times daily. 08/04/16   Volanda Napoleon, PA-C  nicotine polacrilex (NICORETTE) 2 MG gum Take 1 each (2 mg total) by mouth as needed for  smoking cessation. 07/25/16   Encarnacion Slates, NP  ondansetron (ZOFRAN) 4 MG tablet Take 1 tablet (4 mg total) by mouth every 8 (eight) hours as needed for nausea or vomiting. 07/25/16   Encarnacion Slates, NP  oxyCODONE-acetaminophen (PERCOCET/ROXICET) 5-325 MG tablet TK 1 OR 2 TS PO Q  6 H PRF SEVERE PAIN 06/12/16   Historical Provider, MD  pantoprazole (PROTONIX) 20 MG tablet Take 2 tablets (40 mg total) by mouth daily. For acid reflux Patient not taking: Reported on 08/04/2016 07/25/16   Encarnacion Slates, NP  pantoprazole (PROTONIX) 40 MG tablet Take 40 mg by mouth daily.     Historical Provider, MD  sertraline (ZOLOFT) 25 MG tablet Take 3 tablets (75 mg total) by mouth daily. For depression 07/26/16   Loleta Dicker  [place], NP  sucralfate (CARAFATE) 1 g tablet Take 1 tablet (1 g total) by mouth 4 (four) times daily -  with meals and at bedtime. For acid reflux 07/25/16   Encarnacion Slates, NP    Family History Family History  Problem Relation Age of Onset  . Hypertension Other   . Coronary artery disease Other     Social History Social History  Substance Use Topics  . Smoking status: Current Every Day Smoker    Packs/day: 1.50    Years: 30.00    Types: Cigarettes  . Smokeless tobacco: Current User    Types: Chew  . Alcohol use 7.2 oz/week    12 Cans of beer per week     Allergies   Shellfish-derived products; Trazodone and nefazodone; Robaxin [methocarbamol]; and Reglan [metoclopramide]   Review of Systems Review of Systems  10 Systems reviewed and are negative for acute change except as noted in the HPI.  Physical Exam Updated Vital Signs BP 126/86   Pulse 73   Temp 97.8 F (36.6 C) (Oral)   Resp 16   Ht 5\' 9"  (1.753 m)   Wt 140 lb (63.5 kg)   SpO2 100%   BMI 20.67 kg/m   Physical Exam  Constitutional: He is oriented to person, place, and time. He appears well-developed and well-nourished.  Patient is well in appearance. He is alert and nontoxic. He reports pain but does not appear  distressed.  HENT:  Head: Normocephalic and atraumatic.  Mouth/Throat: Oropharynx is clear and moist.  Eyes: Conjunctivae and EOM are normal.  Neck: Neck supple.  Cardiovascular: Normal rate and regular rhythm.   No murmur heard. Pulmonary/Chest: Effort normal and breath sounds normal. No respiratory distress.  Abdominal: Soft. He exhibits no distension and no mass. There is no tenderness. There is no guarding.  Patient reports abdominal pain but as we are conversing and I am palpating the epigastrium, pain is not apparent. There is no guarding or expression of increased pain with palpation.  Musculoskeletal: He exhibits no edema or tenderness.  Neurological: He is alert and oriented to person, place, and time. No cranial nerve deficit. He exhibits normal muscle tone. Coordination normal.  Skin: Skin is warm and dry.  Psychiatric: He has a normal mood and affect.  Nursing note and vitals reviewed.    ED Treatments / Results  Labs (all labs ordered are listed, but only abnormal results are displayed) Labs Reviewed - No data to display  EKG  EKG Interpretation None       Radiology No results found.  Procedures Procedures (including critical care time)  Medications Ordered in ED Medications  gi cocktail (Maalox,Lidocaine,Donnatal) (30 mLs Oral Given 08/10/16 0944)     Initial Impression / Assessment and Plan / ED Course  I have reviewed the triage vital signs and the nursing notes.  Pertinent labs & imaging results that were available during my care of the patient were reviewed by me and considered in my medical decision making (see chart for details).     Final Clinical Impressions(s) / ED Diagnoses   Final diagnoses:  Chronic abdominal pain   At this time, patient is clinically hydrated in appearance. Vital signs are stable. Abdominal examination does not indicate acute abdomen. Patient has recently run out of his Protonix. GI cocktail administered and refills  given for Protonix, Pepcid and Carafate. Patient is counseled on necessity of follow-up with GI and pain management for chronic pain. At this time I do not  see indication for diagnostic testing. New Prescriptions New Prescriptions   No medications on file     Charlesetta Shanks, MD 08/10/16 1015

## 2016-08-10 NOTE — ED Triage Notes (Signed)
Pt arrived by [facility] for chronic abd pain, took home meds this am 0300 with no relief. No acute distress noted at triage.

## 2016-08-13 ENCOUNTER — Emergency Department (HOSPITAL_COMMUNITY)
Admission: EM | Admit: 2016-08-13 | Discharge: 2016-08-13 | Disposition: A | Discharge status: Home / Self Care | Payer: Self-pay | Attending: Emergency Medicine | Admitting: Emergency Medicine

## 2016-08-13 ENCOUNTER — Encounter (HOSPITAL_COMMUNITY): Payer: Self-pay

## 2016-08-13 DIAGNOSIS — J45909 Unspecified asthma, uncomplicated: Secondary | ICD-10-CM | POA: Insufficient documentation

## 2016-08-13 DIAGNOSIS — R1032 Left lower quadrant pain: Secondary | ICD-10-CM | POA: Insufficient documentation

## 2016-08-13 DIAGNOSIS — F17210 Nicotine dependence, cigarettes, uncomplicated: Secondary | ICD-10-CM | POA: Insufficient documentation

## 2016-08-13 DIAGNOSIS — I10 Essential (primary) hypertension: Secondary | ICD-10-CM | POA: Insufficient documentation

## 2016-08-13 MED ORDER — GI COCKTAIL ~~LOC~~
30.0000 mL | Freq: Once | ORAL | Status: AC
  Administered 2016-08-13: 30 mL via ORAL
  Filled 2016-08-13: qty 30

## 2016-08-13 MED ORDER — ACETAMINOPHEN 325 MG PO TABS
325.0000 mg | ORAL_TABLET | Freq: Once | ORAL | Status: AC
  Administered 2016-08-13: 325 mg via ORAL
  Filled 2016-08-13: qty 1

## 2016-08-13 NOTE — ED Notes (Signed)
Signature pad locked, pt stable and states understanding discharge instructions.

## 2016-08-13 NOTE — ED Triage Notes (Signed)
Patient present today with complaints of left pelvic pain started last night. Denies any painful urination frequency or urgency. Denies N/V. D. 140/80 CBG 102

## 2016-08-13 NOTE — Discharge Instructions (Signed)
Please read instructions below. Follow up with your primary care. You can take tylenol as needed for pain. Return to ER for redness or swelling of your testicle or fever.

## 2016-08-13 NOTE — ED Provider Notes (Signed)
[facility] DEPT Provider Note   CSN: [id number] Arrival date & time: 08/13/16  1401     History   Chief Complaint Chief Complaint  Patient presents with  . Abdominal Pain    HPI [name] is a 46 y.o. male.  Pt presents w L sided groin pain that began last night. Reports pain is sharp, intermittent, and worse after urination w radiation into L scrotum. Reports taking tylenol at home with minimal relief. Reports increased urinary frequency. Denies penile discharge, testicular pain, hematuria, flank pain, F/C, N/V/D, SOB, CP. Denies hx hernia.       Past Medical History:  Diagnosis Date  . Alcoholism /alcohol abuse ([place])   . Anemia   . Anxiety   . Arthritis    "knees; arms; elbows" (03/26/2015)  . Asthma   . Bipolar disorder ([place])   . Chronic bronchitis ([place])   . Chronic lower back pain   . Cocaine abuse   . Depression   . Family history of adverse reaction to anesthesia    "grandmother gets confused"  . Femoral condyle fracture ([place]) 03/08/2014   left medial/notes 03/09/2014  . GERD (gastroesophageal reflux disease)   . H/O hiatal hernia   . H/O suicide attempt 10/2012  . Heart murmur    "when he was little" (03/06/2013)  . High cholesterol   . History of blood transfusion 10/2012   "when I tried to commit suicide"  . History of stomach ulcers   . Hypertension   . Marijuana abuse, continuous   . Migraine    "a few times/year" (03/26/2015)  . Pancreatitis   . Pneumonia 1990's X 3  . PTSD (post-traumatic stress disorder)   . Shortness of breath    "can happen at anytime" (03/06/2013)  . Sickle cell trait ([place])   . WPW (Wolff-Parkinson-White syndrome)    [name] 03/06/2013    Patient Active Problem List   Diagnosis Date Noted  . Alcohol use disorder, severe, dependence ([place]) 07/25/2016  . Cocaine use disorder, severe, dependence ([place]) 07/25/2016  . Major depressive disorder, recurrent severe without psychotic features ([place]) 07/20/2016  .  Leukocytosis   . Hospital acquired PNA 05/20/2015  . Pancreatitis 05/18/2015  . Pseudocyst of pancreas 05/18/2015  . Malnutrition of moderate degree 03/27/2015  . Abnormal transaminases 03/26/2015  . Polysubstance abuse (tobacco, cocaine, THC, and ETOH) 03/26/2015  . Acute pancreatitis 03/26/2015  . Alcohol-induced chronic pancreatitis ([place])   . Essential hypertension 02/06/2014  . Alcohol-induced acute pancreatitis 11/28/2013  . Pancreatic pseudocyst/cyst 11/25/2013  . Alcohol dependence ([place]) 10/23/2013  . MDD (major depressive disorder), recurrent severe, without psychosis ([place]) 10/22/2013  . Protein-calorie malnutrition, severe ([place]) 10/10/2013  . Suicide attempt 10/08/2013  . 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 TOBACCO ABUSE 03/23/2007    Past Surgical History:  Procedure Laterality Date  . CARDIAC CATHETERIZATION    . EYE SURGERY Left 1990's   "result of trauma"   . FACIAL FRACTURE SURGERY Left 1990's   "result of trauma"   . FRACTURE SURGERY    . HERNIA REPAIR    . LEFT HEART CATHETERIZATION WITH CORONARY ANGIOGRAM Right 03/07/2013   Procedure: LEFT HEART CATHETERIZATION WITH CORONARY ANGIOGRAM;  Surgeon: [name], MD;  Location: [facility];  Service: Cardiovascular;  Laterality: Right;  . UMBILICAL HERNIA REPAIR         Home Medications    Prior to Admission medications   Medication Sig Start Date End Date Taking? Authorizing Provider  albuterol (PROVENTIL HFA;VENTOLIN HFA) 108 (90 Base) MCG/ACT inhaler Inhale 2 puffs into the lungs every 6 (six) hours as needed for wheezing or shortness of breath. 07/25/16   Encarnacion Slates, NP  benzocaine (ORAJEL) 10 % mucosal gel Use as directed in the mouth or throat 4 (four) times daily as needed for mouth pain. 07/25/16   Encarnacion Slates, NP  cholestyramine (QUESTRAN) 4 g packet Take 1 packet (4 g total) by mouth 3 (three) times daily as needed (diarrhea). 07/25/16   Encarnacion Slates, NP    famotidine (PEPCID) 20 MG tablet Take 1 tablet (20 mg total) by mouth 2 (two) times daily. For acid reflux 07/25/16   Encarnacion Slates, NP  famotidine (PEPCID) 20 MG tablet Take 1 tablet (20 mg total) by mouth 2 (two) times daily. 08/10/16   Charlesetta Shanks, MD  fluticasone (FLONASE) 50 MCG/ACT nasal spray Place 1 spray into both nostrils daily as needed for rhinitis. 07/25/16   Encarnacion Slates, NP  gabapentin (NEURONTIN) 300 MG capsule Take 1 capsule (300 mg total) by mouth 3 (three) times daily. For agitation 07/25/16   Encarnacion Slates, NP  haloperidol (HALDOL) 5 MG tablet Take 1 tablet (5 mg total) by mouth 2 (two) times daily. For mood control 07/25/16   Encarnacion Slates, NP  hydrocerin (EUCERIN) CREA Apply 1 application topically 2 (two) times daily. For dry skin 07/25/16   Encarnacion Slates, NP  hydrOXYzine (ATARAX/VISTARIL) 25 MG tablet Take 1 tablet (25 mg total) by mouth every 6 (six) hours as needed for anxiety. 07/25/16   Encarnacion Slates, NP  lipase/protease/amylase (CREON) 12000 units CPEP capsule Take 2 capsules (24,000 Units total) by mouth 3 (three) times daily with meals. For pancreatitis 07/25/16   Encarnacion Slates, NP  loratadine (CLARITIN) 10 MG tablet Take 1 tablet (10 mg total) by mouth daily. (May purchase from over the counter): For allergies 07/26/16   Encarnacion Slates, NP  metoprolol tartrate (LOPRESSOR) 25 MG tablet Take 1 tablet (25 mg total) by mouth 2 (two) times daily. For high blood pressure 07/25/16   Encarnacion Slates, NP  naltrexone (DEPADE) 50 MG tablet Take 1 tablet (50 mg total) by mouth daily. For alcoholism 07/26/16   Encarnacion Slates, NP  naproxen (NAPROSYN) 375 MG tablet Take 1 tablet (375 mg total) by mouth 2 (two) times daily. 08/04/16   Volanda Napoleon, PA-C  nicotine polacrilex (NICORETTE) 2 MG gum Take 1 each (2 mg total) by mouth as needed for smoking cessation. 07/25/16   Encarnacion Slates, NP  ondansetron (ZOFRAN) 4 MG tablet Take 1 tablet (4 mg total) by mouth every 8 (eight) hours as needed for  nausea or vomiting. 07/25/16   Encarnacion Slates, NP  oxyCODONE-acetaminophen (PERCOCET/ROXICET) 5-325 MG tablet TK 1 OR 2 TS PO Q  6 H PRF SEVERE PAIN 06/12/16   Historical Provider, MD  pantoprazole (PROTONIX) 20 MG tablet Take 2 tablets (40 mg total) by mouth daily. For acid reflux Patient not taking: Reported on 08/04/2016 07/25/16   Encarnacion Slates, NP  pantoprazole (PROTONIX) 20 MG tablet Take 1 tablet (20 mg total) by mouth daily. 08/10/16   Charlesetta Shanks, MD  pantoprazole (PROTONIX) 40 MG tablet Take 40 mg by mouth daily.     Historical Provider, MD  sertraline (ZOLOFT) 25 MG tablet Take 3 tablets (75 mg total) by mouth daily. For depression 07/26/16   Encarnacion Slates, NP  sucralfate (CARAFATE) 1  g tablet Take 1 tablet (1 g total) by mouth 4 (four) times daily -  with meals and at bedtime. For acid reflux 07/25/16   Encarnacion Slates, NP  sucralfate (CARAFATE) 1 g tablet Take 1 tablet (1 g total) by mouth 4 (four) times daily -  with meals and at bedtime. 08/10/16   Charlesetta Shanks, MD    Family History Family History  Problem Relation Age of Onset  . Hypertension Other   . Coronary artery disease Other     Social History Social History  Substance Use Topics  . Smoking status: Current Every Day Smoker    Packs/day: 1.50    Years: 30.00    Types: Cigarettes  . Smokeless tobacco: Current User    Types: Chew  . Alcohol use 7.2 oz/week    12 Cans of beer per week     Allergies   Shellfish-derived products; Trazodone and nefazodone; Robaxin [methocarbamol]; and Reglan [metoclopramide]   Review of Systems Review of Systems  Constitutional: Negative for chills and fever.  Respiratory: Negative for shortness of breath.   Cardiovascular: Negative for chest pain.  Gastrointestinal: Negative for diarrhea, nausea and vomiting.  Genitourinary: Positive for frequency. Negative for discharge, hematuria and testicular pain.       R groin pain w radiation into R scrotum  Musculoskeletal: Negative for  back pain.  Skin: Negative for rash.     Physical Exam Updated Vital Signs BP 112/81   Pulse 88   Temp 98 F (36.7 C) (Oral)   Resp 20   SpO2 100%   Physical Exam  Constitutional: He appears well-developed and well-nourished. No distress.  HENT:  Head: Normocephalic and atraumatic.  Eyes: Conjunctivae are normal.  Cardiovascular: Normal rate, regular rhythm, normal heart sounds and intact distal pulses.   Pulmonary/Chest: Effort normal.  Abdominal: Soft. Bowel sounds are normal. He exhibits no distension and no mass. There is tenderness (epigastric). There is no guarding. No hernia. Hernia confirmed negative in the right inguinal area and confirmed negative in the left inguinal area.  Genitourinary: Testes normal and penis normal. Right testis shows no swelling. Left testis shows no swelling. No penile erythema or penile tenderness. No discharge found.  Genitourinary Comments: Exam performed w chaperone.  Psychiatric: He has a normal mood and affect. His behavior is normal.  Nursing note and vitals reviewed.    ED Treatments / Results  Labs (all labs ordered are listed, but only abnormal results are displayed) Labs Reviewed - No data to display  EKG  EKG Interpretation None       Radiology No results found.  Procedures Procedures (including critical care time)  Medications Ordered in ED Medications  gi cocktail (Maalox,Lidocaine,Donnatal) (30 mLs Oral Given 08/13/16 1457)  acetaminophen (TYLENOL) tablet 325 mg (325 mg Oral Given 08/13/16 1522)     Initial Impression / Assessment and Plan / ED Course  I have reviewed the triage vital signs and the nursing notes.  Pertinent labs & imaging results that were available during my care of the patient were reviewed by me and considered in my medical decision making (see chart for details).     Pt w R sided groin pain. Significant urinary sx. Exam reassuring; no inguinal hernia, no varicocele, no redness or swelling.  Epididymitis unlikely, testicular torsion unlikely. Pt afebrile, not in distress. Pt complained of acid reflux; GI cocktail given w relief. Pt w multiple ED visits and care plan in place; last visit 08/10/2016. Provided reassurance and encouraged  follow up w PCP. Tylenol given in ED. Pt safe for discharge.  Patient discussed with Dr. Johnney Killian. Discussed results, findings, treatment and follow up. Patient advised of return precautions. Patient verbalized understanding and agreed with plan.   Final Clinical Impressions(s) / ED Diagnoses   Final diagnoses:  Left groin pain    New Prescriptions Discharge Medication List as of 08/13/2016  3:17 PM       [place] N Russo, PA-C 08/13/16 1717    [place] N Russo, PA-C 08/13/16 1718    Charlesetta Shanks, MD 08/18/16 2200

## 2016-08-15 IMAGING — CT CT ABD-PELV W/ CM
2 of 5 series · 13 of 46 positions shown, 15 images · IV contrast (Iodine)
Comparison: None.

CLINICAL DATA: Initial evaluation left upper quadrant pain, left
flank pain, similar to prior episodes of pancreatitis, personal
history of substance abuse, pancreatitis, and hypertension

EXAM:
CT ABDOMEN AND PELVIS WITH CONTRAST
TECHNIQUE: Multidetector CT imaging of the abdomen and pelvis was performed
using the standard protocol following bolus administration of
intravenous contrast.
CONTRAST:  100mL OMNIPAQUE IOHEXOL 300 MG/ML  SOLN

[Series 201: routine, idose (2) · axial · 0.61mm/px · z∈[-463,-83]mm · 10 of 86 slices shown, 12 images]
[im 5/86  soft-tissue]
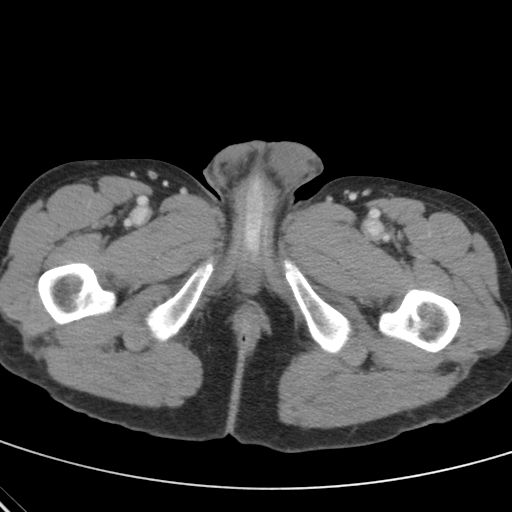
[im 5/86  bone]
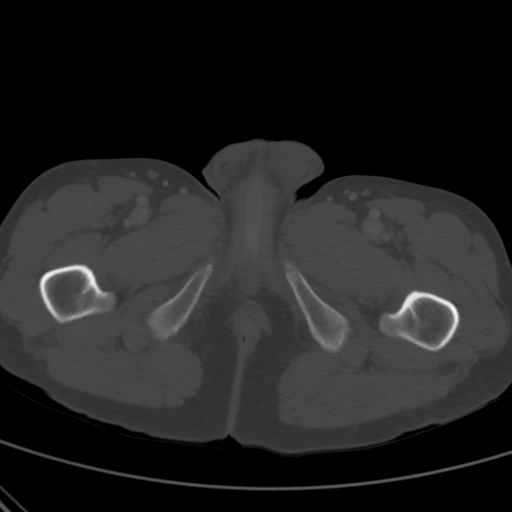
[im 15/86  soft-tissue]
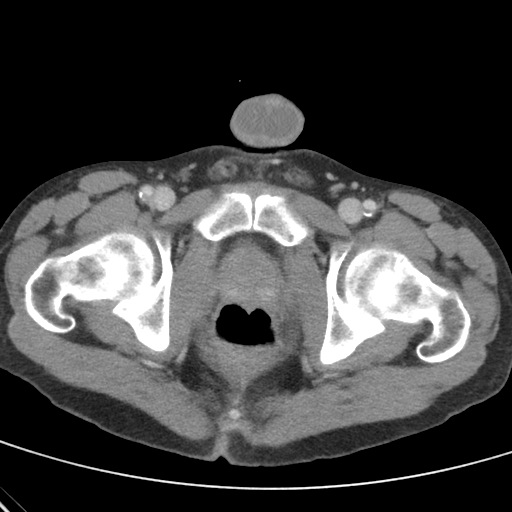
[im 24/86  soft-tissue]
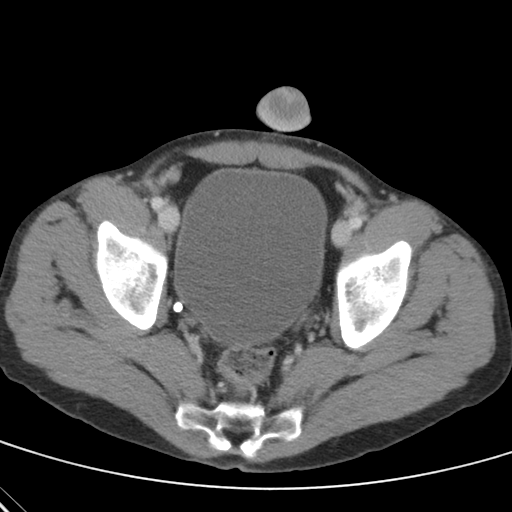
[im 29/86  soft-tissue]
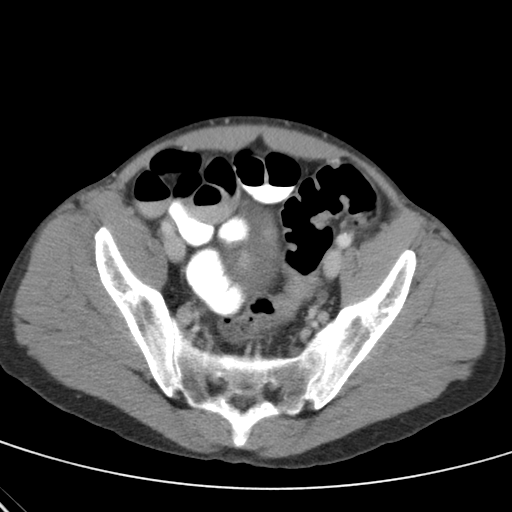
[im 38/86  soft-tissue]
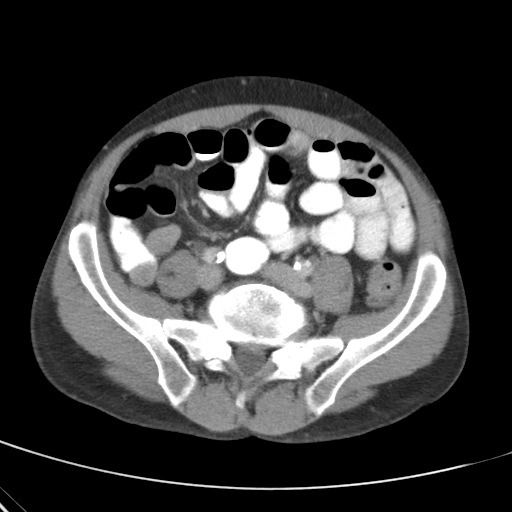
[im 48/86  soft-tissue]
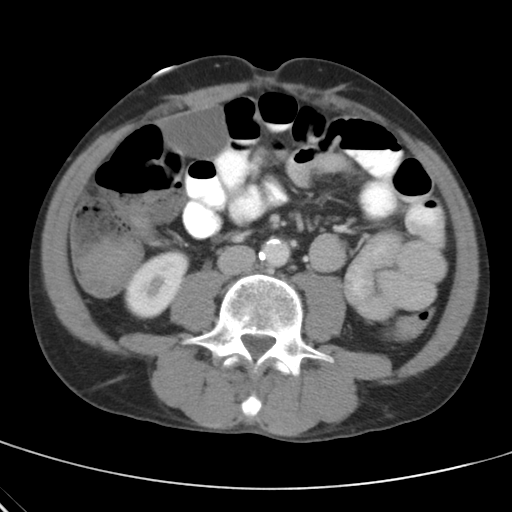
[im 57/86  soft-tissue]
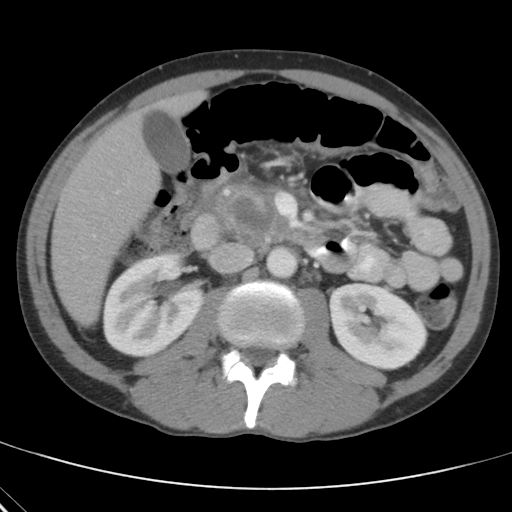
[im 62/86  soft-tissue]
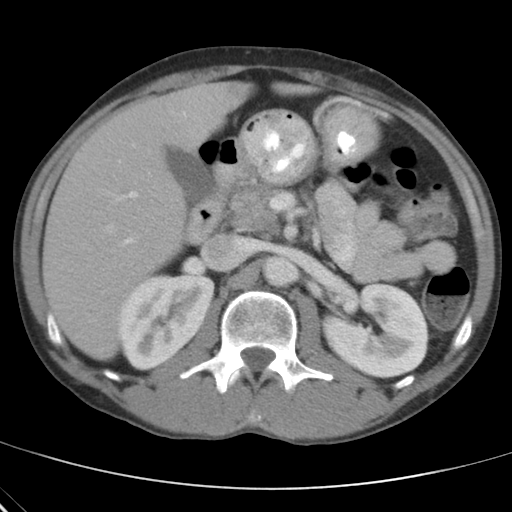
[im 71/86  soft-tissue]
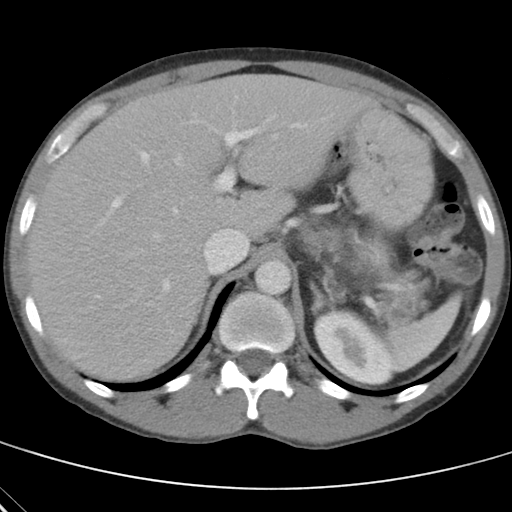
[im 71/86  bone]
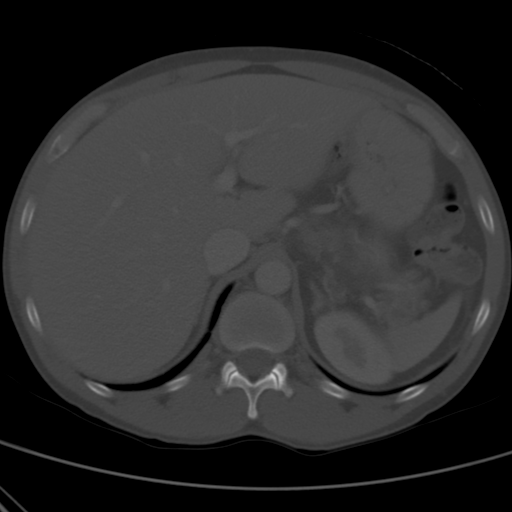
[im 81/86  soft-tissue]
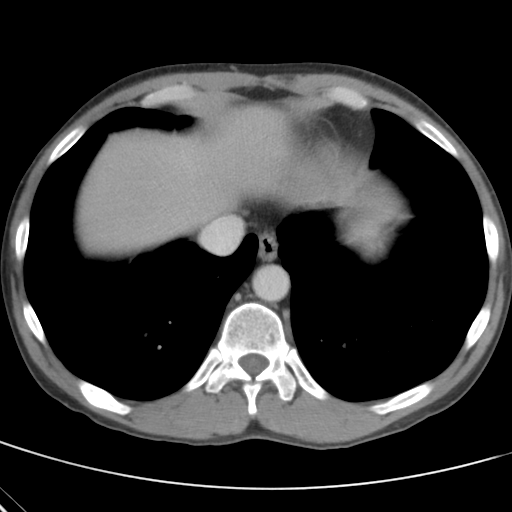

[Series 202: coronals, idose (2) · coronal · 0.45mm/px · 3 of 91 slices shown]
[im 31/91  soft-tissue]
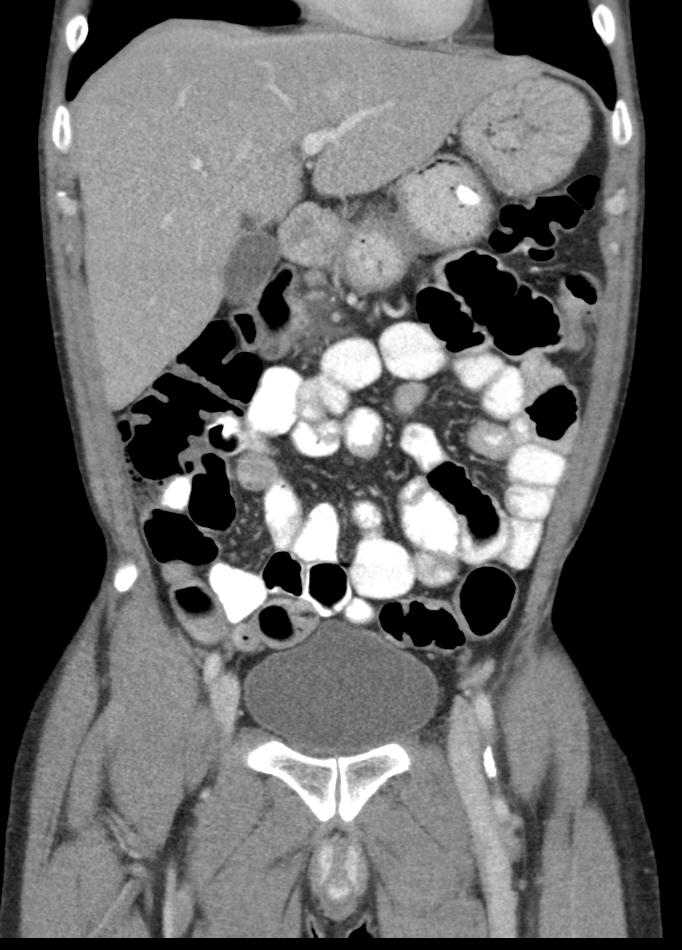
[im 41/91  soft-tissue]
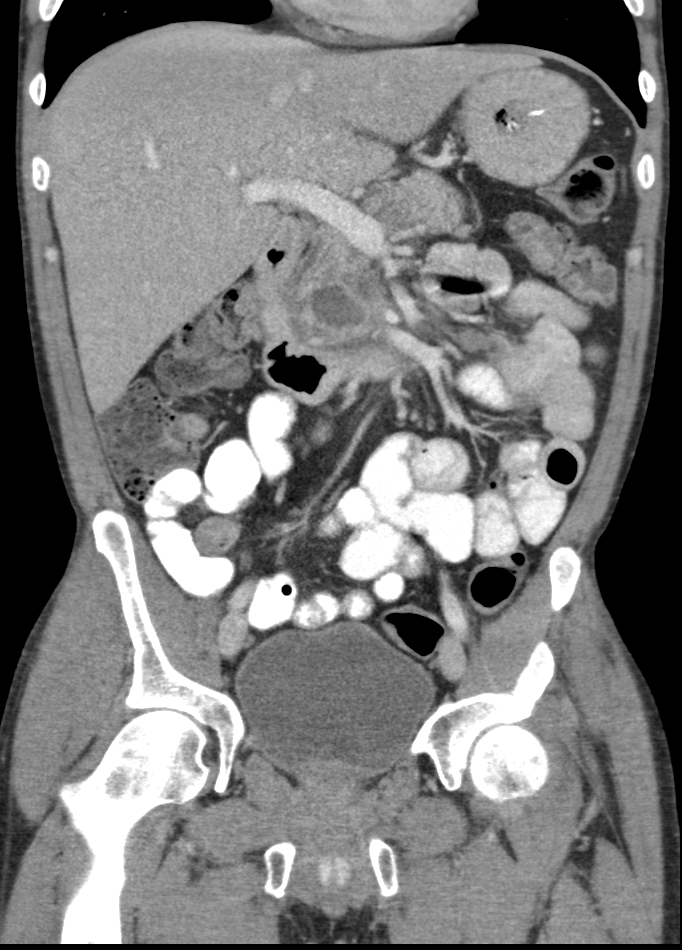
[im 51/91  soft-tissue]
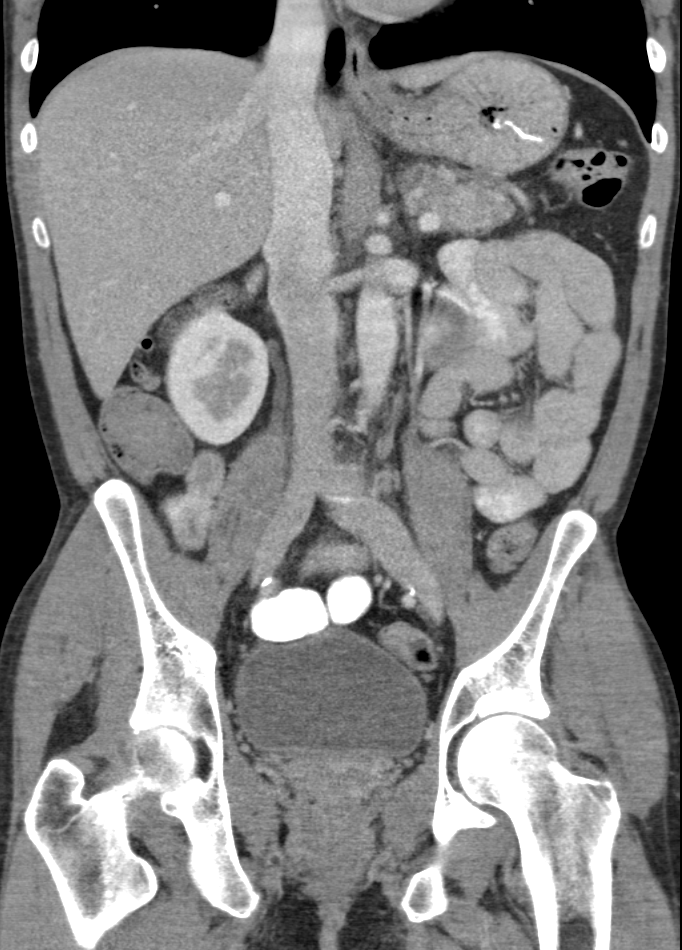

[13 of 46 positions shown; findings below may reference images not displayed]

FINDINGS: Visualized portions of the lung bases clear. Visualized portions of
the are normal.

Mild diffuse fatty infiltration of the liver. Gallbladder is normal.
Spleen is normal.

Adrenal glands are normal. Kidneys are normal. Mild calcification of
the abdominal aorta.

Stomach, small bowel, and large bowel are normal.

Bladder and reproductive organs are normal. No acute musculoskeletal
findings. No ascites.

There is mild inflammatory change around the distal body and tail
the pancreas. The tail is prominent with a diameter of 3 cm. There
are numerous tiny cystic areas in the tail of the pancreas center
new from prior study. In the body of the pancreas there is 16 mm
cystic area increased about 13 mm previously. A cystic lesion in the
uncinate process measures 28 x 19 mm, increased from 13 x 23 mm
previously.
IMPRESSION: Findings most consistent with acute pancreatitis. There are numerous
tiny cystic lesions in the tail the pancreas that were not present
previously. These could represent small pseudocysts. There is also
interval enlargement of 2 more discrete pancreatic cystic lesions as
described above. These may represent enlarged pseudocysts as well.
The possibility of papillary intraductal mucinous tumor is not
excluded and pancreatic MRI would be suggested

## 2016-08-19 IMAGING — CT CT HEAD W/O CM
1 series · 16 of 30 positions shown, 20 images · non-contrast
Comparison: 09/13/2013

CLINICAL DATA: Headache and dizziness.

EXAM:
CT HEAD WITHOUT CONTRAST
TECHNIQUE: Contiguous axial images were obtained from the base of the skull
through the vertex without intravenous contrast.

[Series 2: head 5.0 h30s · axial · 0.48mm/px · z∈[-127,+8]mm · 16 of 30 slices shown, 20 images]
[im 2/30  brain]
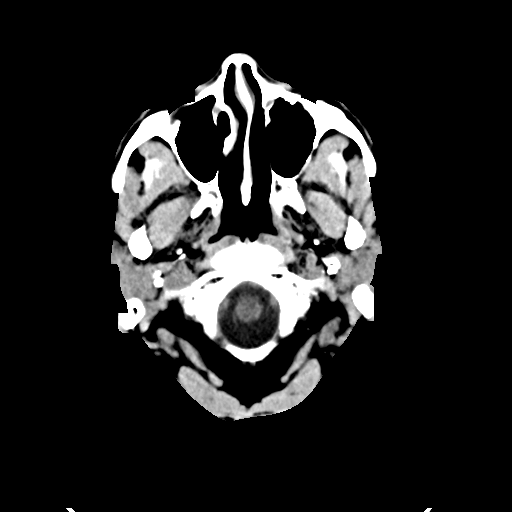
[im 2/30  bone]
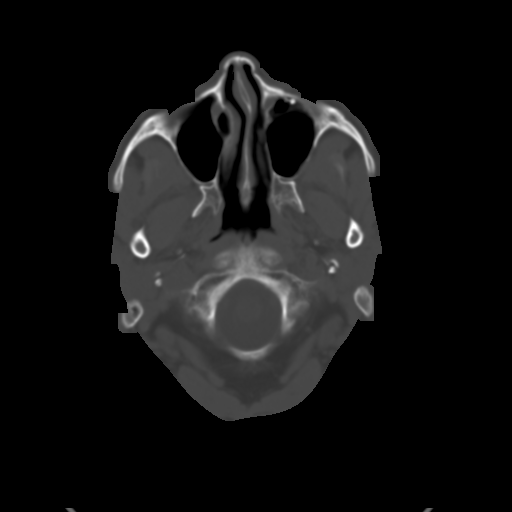
[im 4/30  brain]
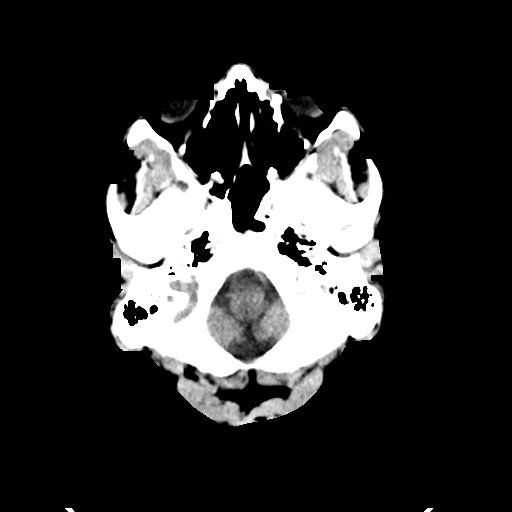
[im 6/30  brain]
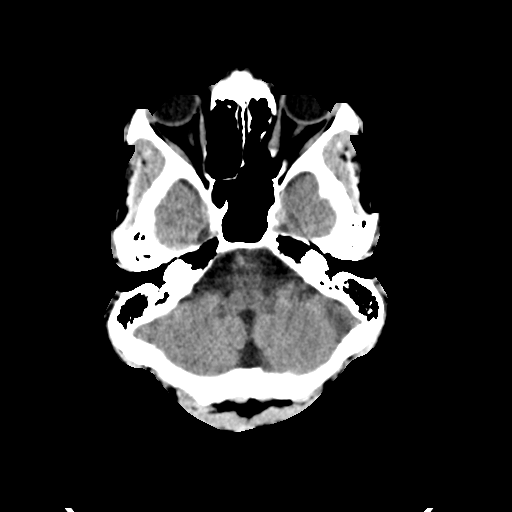
[im 8/30  brain]
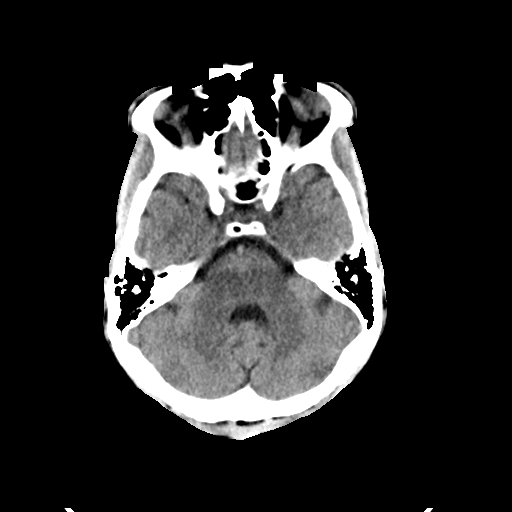
[im 9/30  brain]
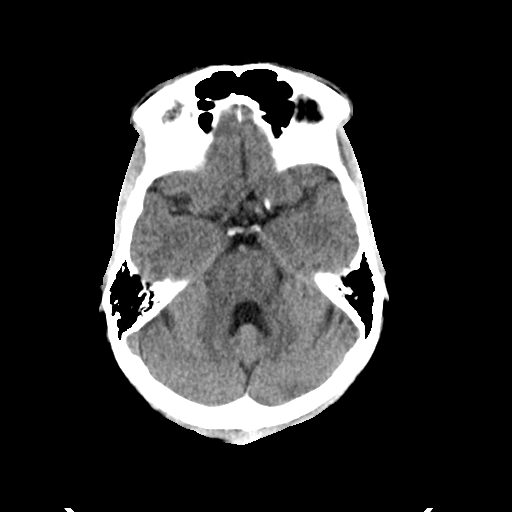
[im 9/30  bone]
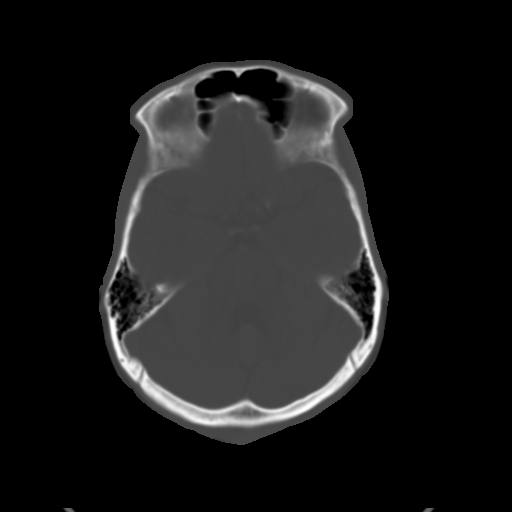
[im 11/30  brain]
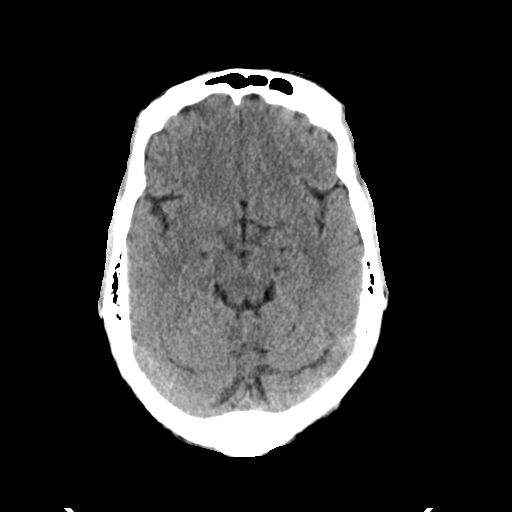
[im 13/30  brain]
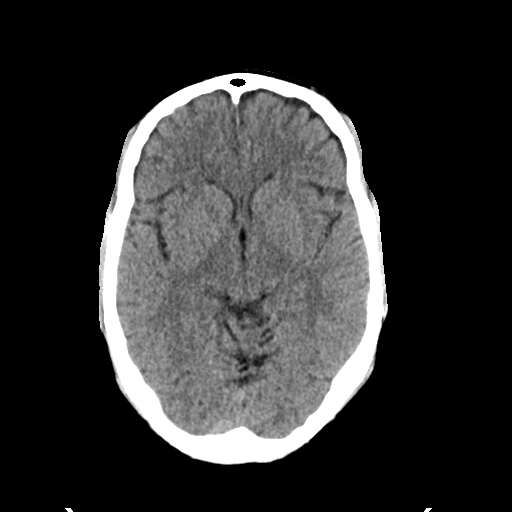
[im 15/30  brain]
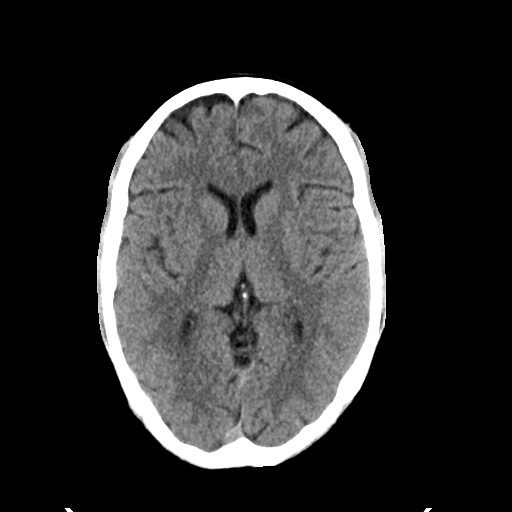
[im 16/30  brain]
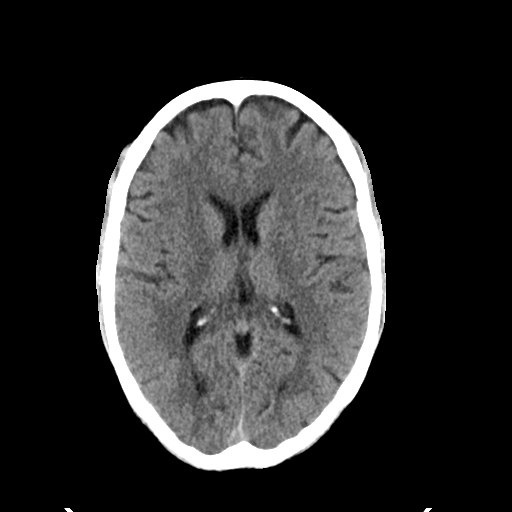
[im 16/30  bone]
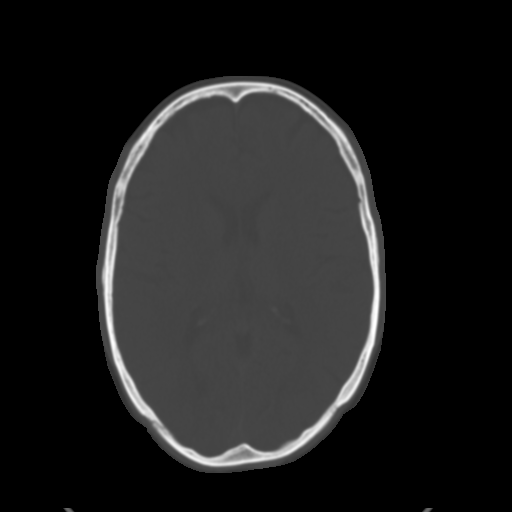
[im 18/30  brain]
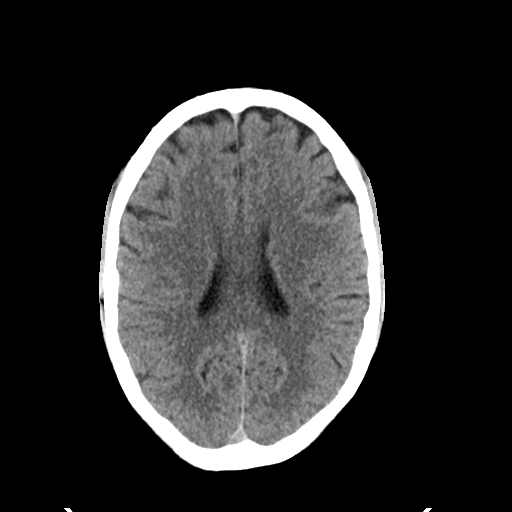
[im 20/30  brain]
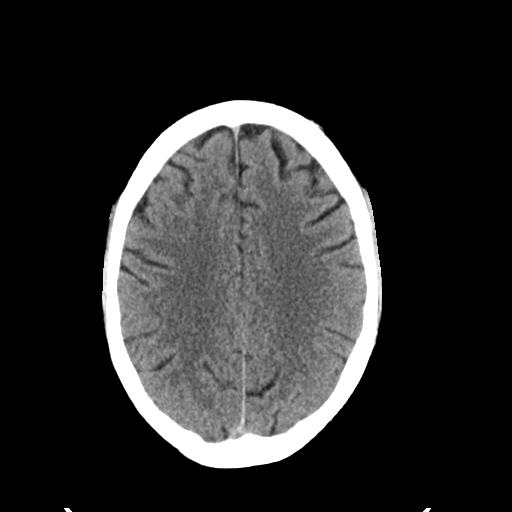
[im 22/30  brain]
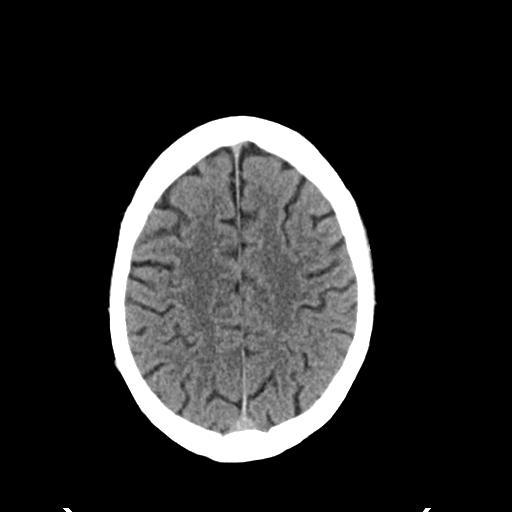
[im 23/30  brain]
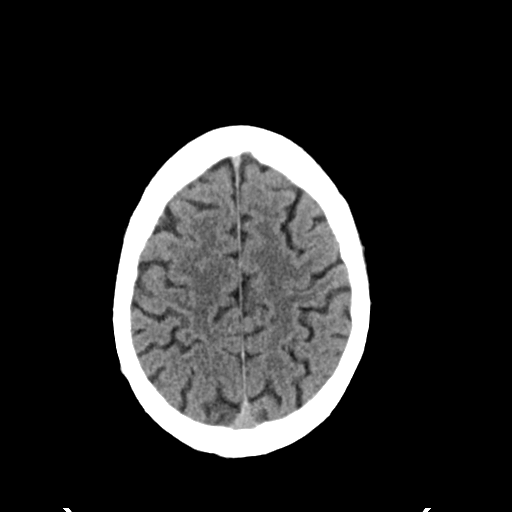
[im 23/30  bone]
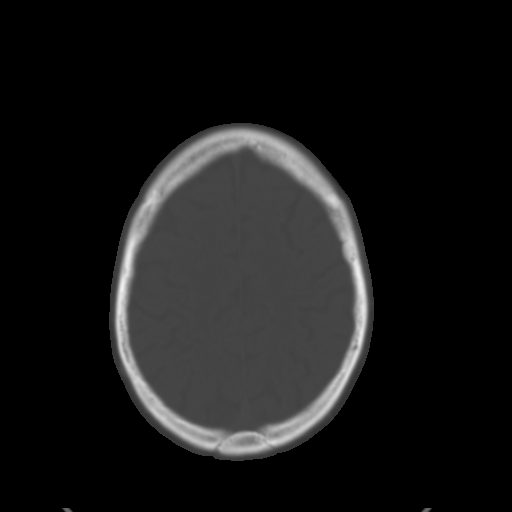
[im 25/30  brain]
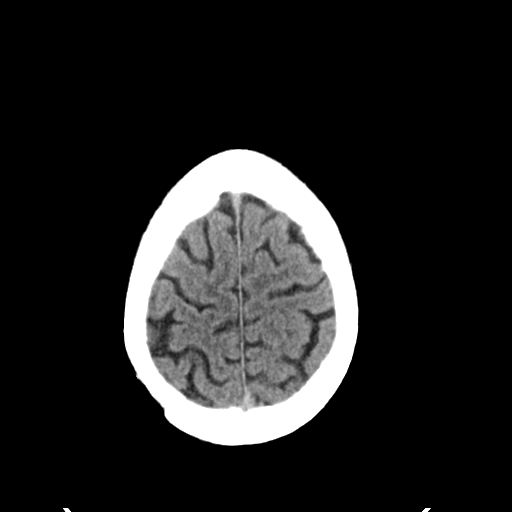
[im 27/30  brain]
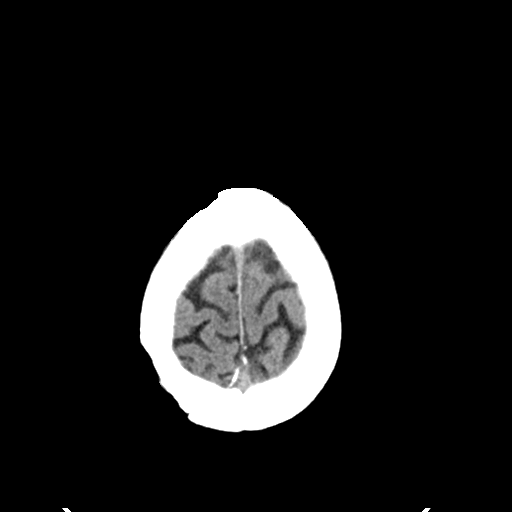
[im 29/30  brain]
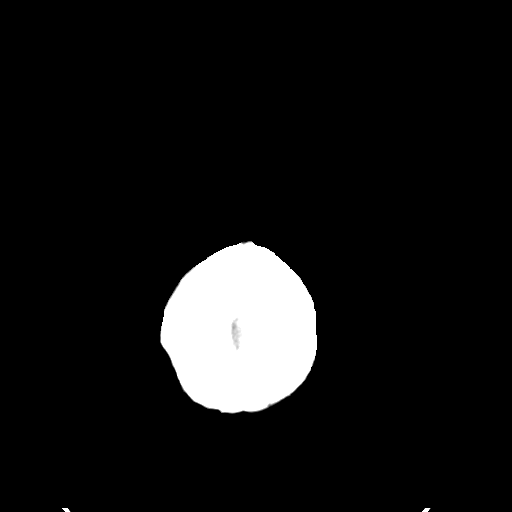

[16 of 30 positions shown; findings below may reference images not displayed]

FINDINGS: Diffuse cerebral atrophy. No ventricular dilatation. No significant
white matter disease. Old fracture deformity and postoperative
changes involving the left medial and inferior orbital walls. Old
nasal bone fractures. No mass effect or midline shift. No abnormal
extra-axial fluid collections. Gray-white matter junctions are
distinct. Basal cisterns are not effaced. No evidence of acute
intracranial hemorrhage. No depressed skull fractures. Visualized
paranasal sinuses and mastoid air cells are not opacified.
IMPRESSION: No acute intracranial abnormalities.  Chronic atrophy.

## 2016-08-20 IMAGING — CR DG ABDOMEN ACUTE W/ 1V CHEST
3 series · 3 of 3 positions shown · non-contrast
Comparison: CT Abdomen and Pelvis 02/23/2014 and earlier.

CLINICAL DATA: 43-year-old male with chest abdominal pain headache
and vomiting. Initial encounter. Current history of pancreatitis.

EXAM:
ACUTE ABDOMEN SERIES (ABDOMEN 2 VIEW & CHEST 1 VIEW)

[w chest pa]
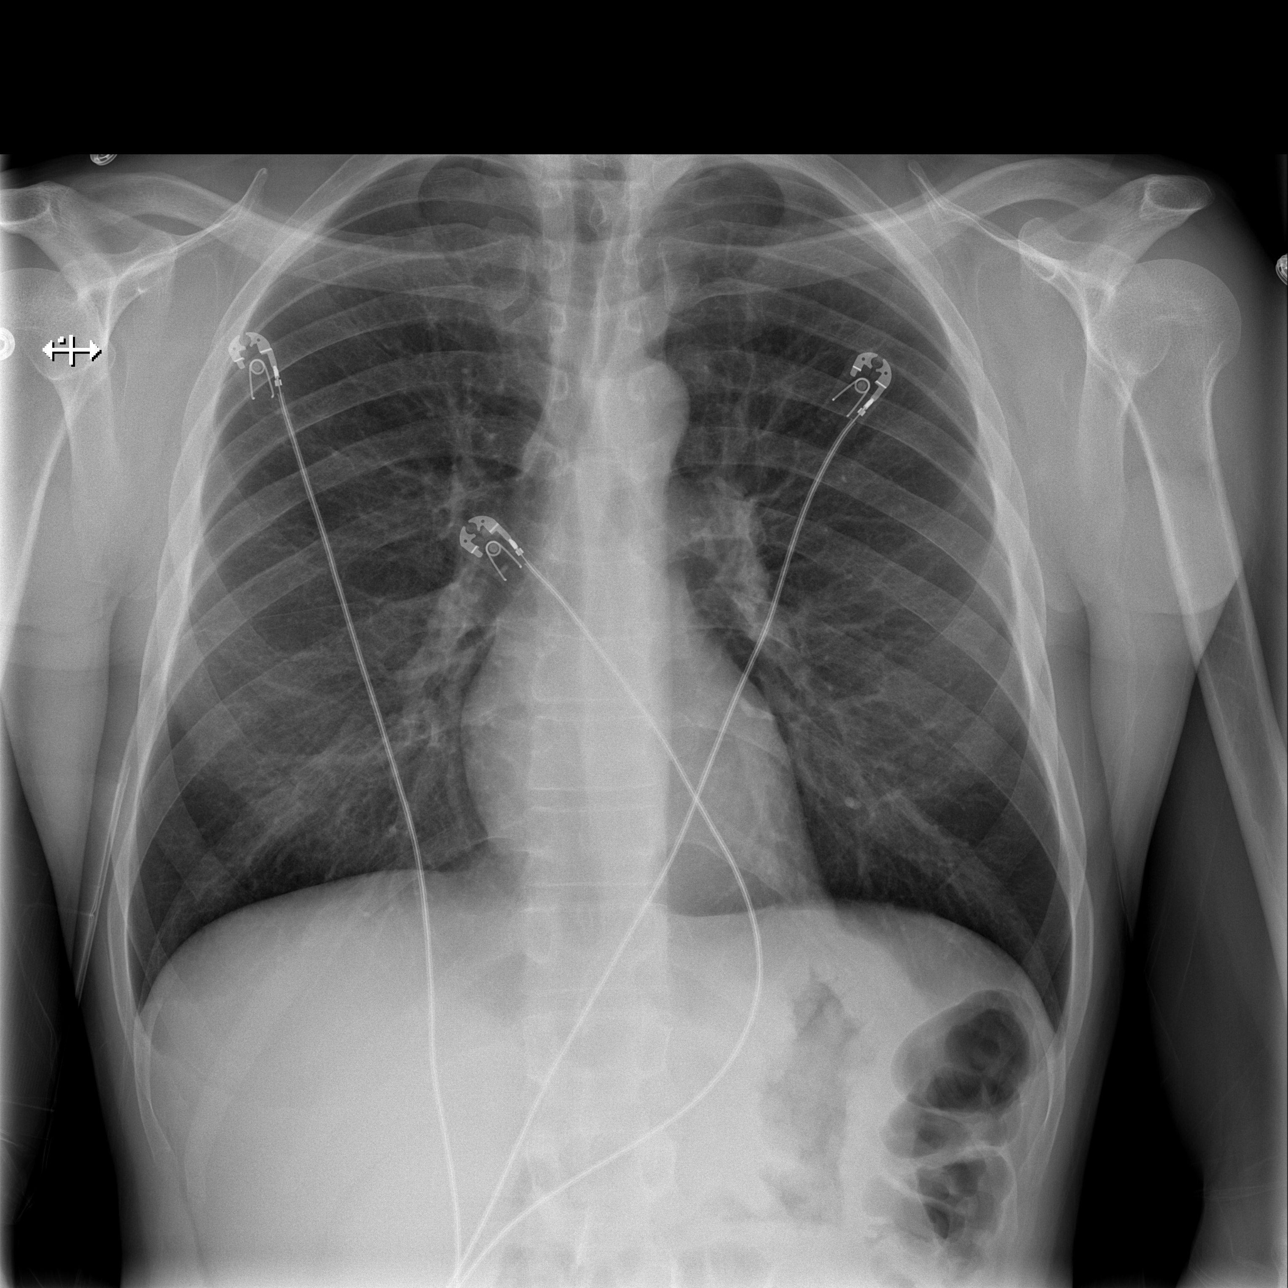

[w abdomen upright]
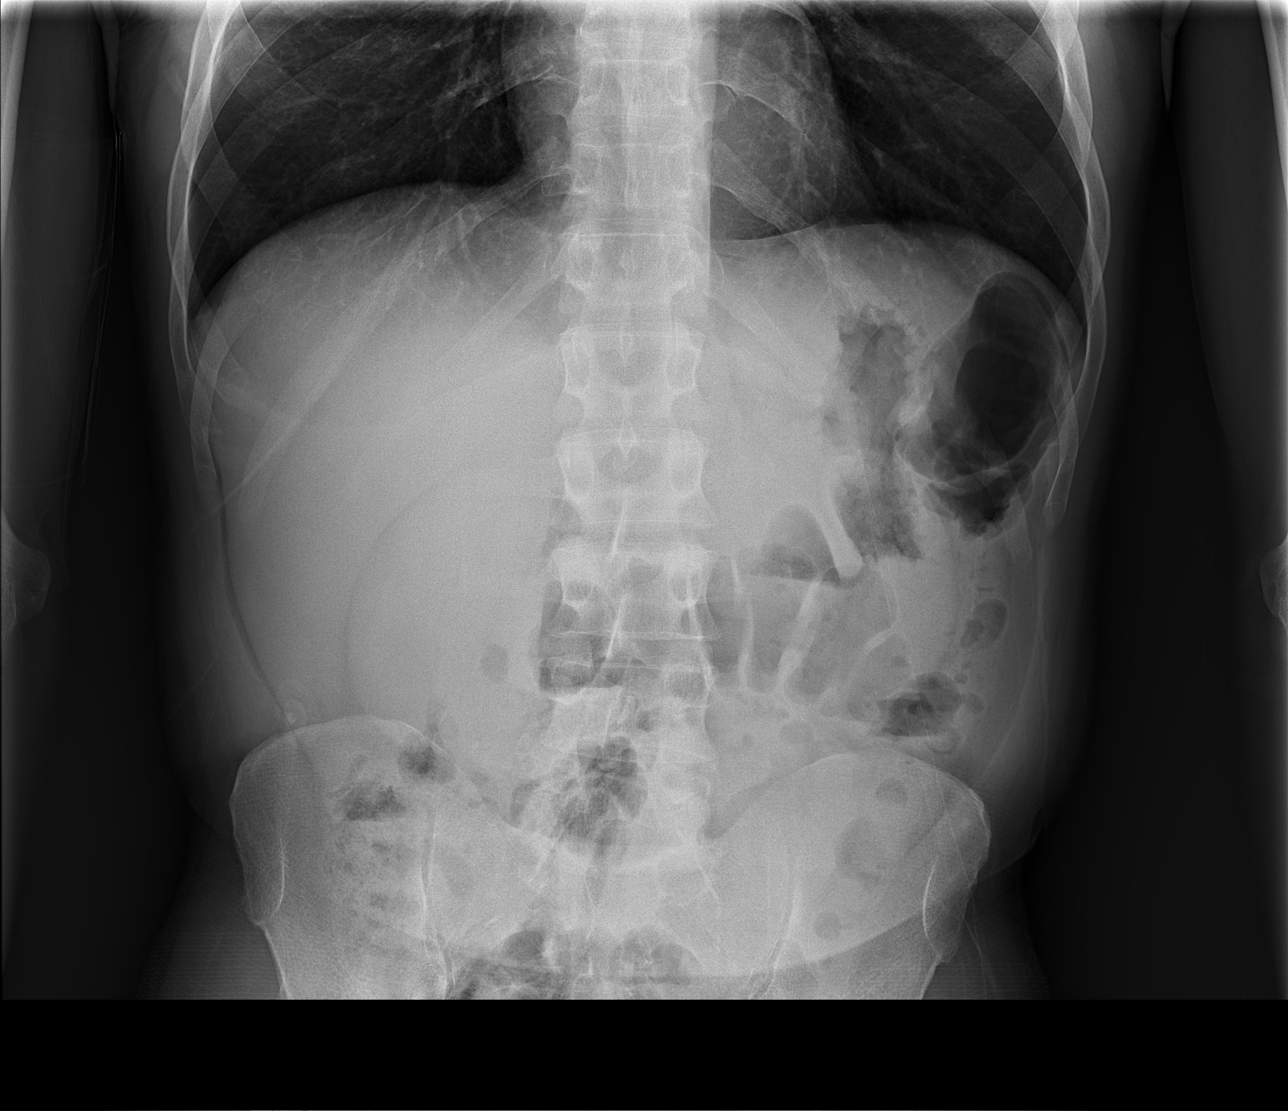

[t abdomen supine]
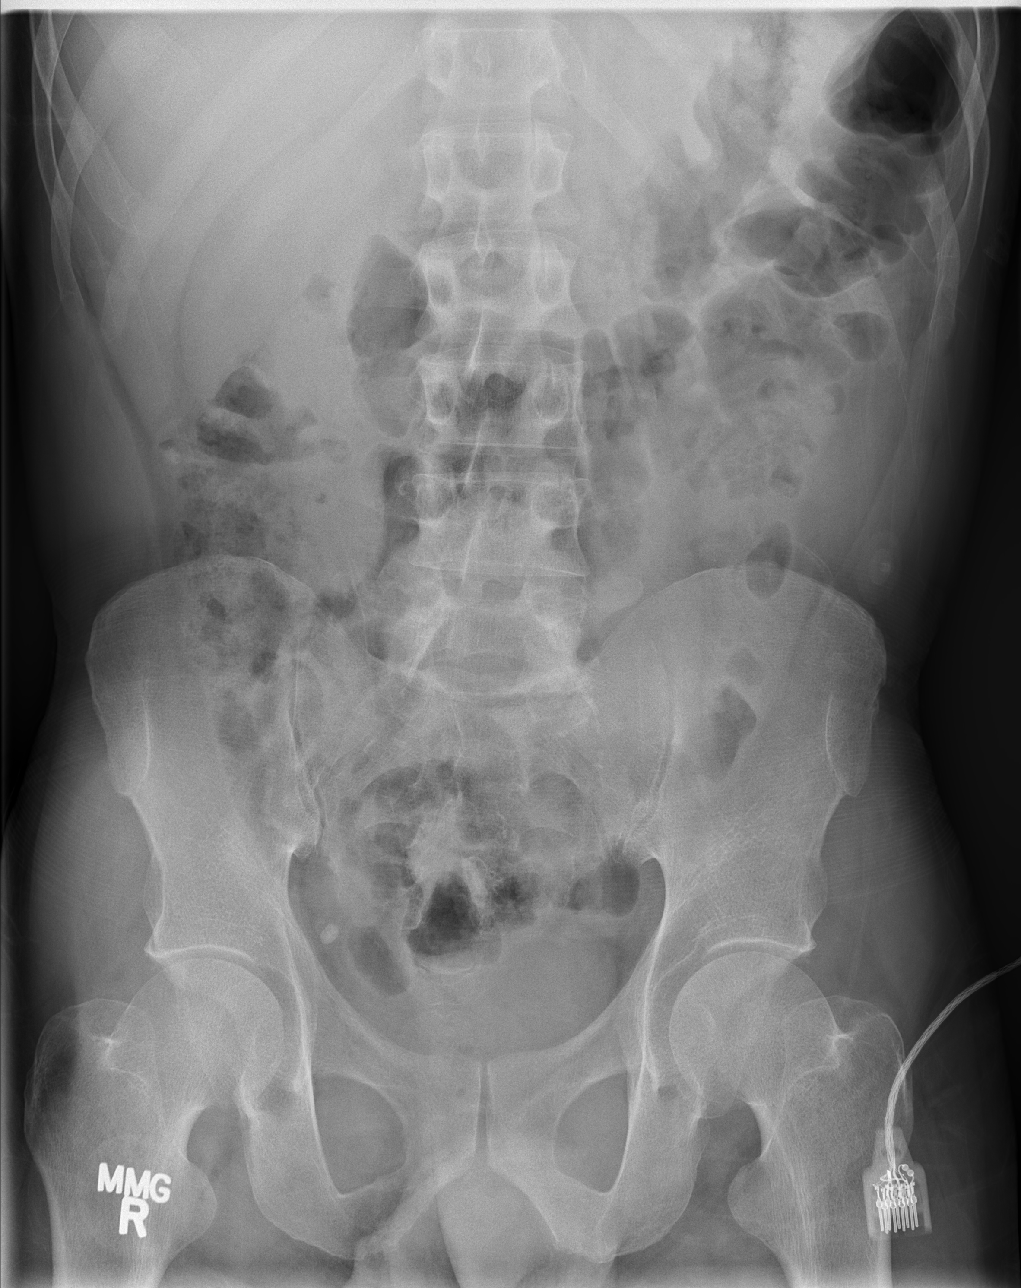

[3 of 3 positions shown; findings below may reference images not displayed]

FINDINGS: No pleural effusion identified. Stable lung volumes. No
pneumothorax, pulmonary edema or acute opacity. Normal cardiac size
and mediastinal contours. Visualized tracheal air column is within
normal limits.

No pneumoperitoneum. Non obstructed bowel gas pattern. Stable
visualized osseous structures.
IMPRESSION: 1. Non obstructed bowel gas pattern, no free air. See also a recent
CT Abdomen and Pelvis 02/23/2014.
[DATE].  No acute cardiopulmonary abnormality.

## 2016-08-25 ENCOUNTER — Encounter (HOSPITAL_COMMUNITY): Payer: Self-pay

## 2016-08-25 ENCOUNTER — Emergency Department (HOSPITAL_COMMUNITY)
Admission: EM | Admit: 2016-08-25 | Discharge: 2016-08-25 | Disposition: A | Discharge status: Home / Self Care | Payer: Self-pay | Attending: Emergency Medicine | Admitting: Emergency Medicine

## 2016-08-25 ENCOUNTER — Encounter (HOSPITAL_COMMUNITY): Payer: Self-pay | Admitting: Emergency Medicine

## 2016-08-25 ENCOUNTER — Emergency Department (HOSPITAL_COMMUNITY)
Admission: EM | Admit: 2016-08-25 | Discharge: 2016-08-26 | Disposition: A | Discharge status: Home / Self Care | Payer: Self-pay | Attending: Emergency Medicine | Admitting: Emergency Medicine

## 2016-08-25 DIAGNOSIS — R1013 Epigastric pain: Secondary | ICD-10-CM | POA: Insufficient documentation

## 2016-08-25 DIAGNOSIS — F17210 Nicotine dependence, cigarettes, uncomplicated: Secondary | ICD-10-CM | POA: Insufficient documentation

## 2016-08-25 DIAGNOSIS — I10 Essential (primary) hypertension: Secondary | ICD-10-CM | POA: Insufficient documentation

## 2016-08-25 DIAGNOSIS — J45909 Unspecified asthma, uncomplicated: Secondary | ICD-10-CM | POA: Insufficient documentation

## 2016-08-25 DIAGNOSIS — Z79899 Other long term (current) drug therapy: Secondary | ICD-10-CM | POA: Insufficient documentation

## 2016-08-25 DIAGNOSIS — R110 Nausea: Secondary | ICD-10-CM | POA: Insufficient documentation

## 2016-08-25 DIAGNOSIS — K860 Alcohol-induced chronic pancreatitis: Secondary | ICD-10-CM | POA: Insufficient documentation

## 2016-08-25 DIAGNOSIS — K861 Other chronic pancreatitis: Secondary | ICD-10-CM | POA: Insufficient documentation

## 2016-08-25 LAB — CBC WITH DIFFERENTIAL/PLATELET
Basophils Absolute: 0 10*3/uL (ref 0.0–0.1)
Basophils Relative: 0 %
Eosinophils Absolute: 0.4 10*3/uL (ref 0.0–0.7)
Eosinophils Relative: 3 %
HEMATOCRIT: 37.4 % — AB (ref 39.0–52.0)
HEMOGLOBIN: 12.4 g/dL — AB (ref 13.0–17.0)
LYMPHS ABS: 2.4 10*3/uL (ref 0.7–4.0)
LYMPHS PCT: 16 %
MCH: 27.5 pg (ref 26.0–34.0)
MCHC: 33.2 g/dL (ref 30.0–36.0)
MCV: 82.9 fL (ref 78.0–100.0)
MONO ABS: 1.3 10*3/uL — AB (ref 0.1–1.0)
MONOS PCT: 9 %
NEUTROS PCT: 72 %
Neutro Abs: 10.4 10*3/uL — ABNORMAL HIGH (ref 1.7–7.7)
Platelets: 258 10*3/uL (ref 150–400)
RBC: 4.51 MIL/uL (ref 4.22–5.81)
RDW: 15.8 % — AB (ref 11.5–15.5)
WBC: 14.5 10*3/uL — AB (ref 4.0–10.5)

## 2016-08-25 LAB — COMPREHENSIVE METABOLIC PANEL
ALK PHOS: 130 U/L — AB (ref 38–126)
ALT: 58 U/L (ref 17–63)
ANION GAP: 13 (ref 5–15)
AST: 87 U/L — ABNORMAL HIGH (ref 15–41)
Albumin: 4.8 g/dL (ref 3.5–5.0)
BILIRUBIN TOTAL: 1 mg/dL (ref 0.3–1.2)
BUN: 15 mg/dL (ref 6–20)
CALCIUM: 9.4 mg/dL (ref 8.9–10.3)
CO2: 25 mmol/L (ref 22–32)
CREATININE: 0.91 mg/dL (ref 0.61–1.24)
Chloride: 96 mmol/L — ABNORMAL LOW (ref 101–111)
GFR calc non Af Amer: >60 mL/min (ref >60)
Glucose, Bld: 97 mg/dL (ref 65–99)
Potassium: 3.8 mmol/L (ref 3.5–5.1)
Sodium: 134 mmol/L — ABNORMAL LOW (ref 135–145)
Total Protein: 9.3 g/dL — ABNORMAL HIGH (ref 6.5–8.1)

## 2016-08-25 LAB — LIPASE, BLOOD: Lipase: 199 U/L — ABNORMAL HIGH (ref 11–51)

## 2016-08-25 MED ORDER — MORPHINE SULFATE (PF) 4 MG/ML IV SOLN
4.0000 mg | Freq: Once | INTRAVENOUS | Status: AC
  Administered 2016-08-26: 4 mg via INTRAVENOUS
  Filled 2016-08-25: qty 1

## 2016-08-25 MED ORDER — GI COCKTAIL ~~LOC~~
30.0000 mL | Freq: Once | ORAL | Status: AC
  Administered 2016-08-25: 30 mL via ORAL
  Filled 2016-08-25: qty 30

## 2016-08-25 MED ORDER — ONDANSETRON 4 MG PO TBDP
8.0000 mg | ORAL_TABLET | Freq: Once | ORAL | Status: AC
  Administered 2016-08-25: 8 mg via ORAL
  Filled 2016-08-25: qty 2

## 2016-08-25 MED ORDER — KETOROLAC TROMETHAMINE 60 MG/2ML IM SOLN
30.0000 mg | Freq: Once | INTRAMUSCULAR | Status: AC
  Administered 2016-08-25: 30 mg via INTRAMUSCULAR
  Filled 2016-08-25: qty 2

## 2016-08-25 MED ORDER — SUCRALFATE 1 G PO TABS
1.0000 g | ORAL_TABLET | Freq: Once | ORAL | Status: AC
  Administered 2016-08-25: 1 g via ORAL
  Filled 2016-08-25: qty 1

## 2016-08-25 MED ORDER — PROMETHAZINE HCL 25 MG PO TABS: 25.0000 mg | ORAL_TABLET | Freq: Four times a day (QID) | ORAL | 0 refills | Status: DC | PRN

## 2016-08-25 MED ORDER — OXYCODONE-ACETAMINOPHEN 5-325 MG PO TABS
1.0000 | ORAL_TABLET | Freq: Once | ORAL | Status: AC
  Administered 2016-08-25: 1 via ORAL
  Filled 2016-08-25: qty 1

## 2016-08-25 MED ORDER — SODIUM CHLORIDE 0.9 % IV BOLUS (SEPSIS)
1000.0000 mL | Freq: Once | INTRAVENOUS | Status: AC
  Administered 2016-08-26: 1000 mL via INTRAVENOUS

## 2016-08-25 MED ORDER — ACETAMINOPHEN 325 MG PO TABS
650.0000 mg | ORAL_TABLET | Freq: Once | ORAL | Status: AC
  Administered 2016-08-25: 650 mg via ORAL
  Filled 2016-08-25: qty 2

## 2016-08-25 MED ORDER — ONDANSETRON HCL 4 MG/2ML IJ SOLN
4.0000 mg | Freq: Once | INTRAMUSCULAR | Status: AC
  Administered 2016-08-26: 4 mg via INTRAVENOUS
  Filled 2016-08-25: qty 2

## 2016-08-25 MED ORDER — ONDANSETRON 4 MG PO TBDP
4.0000 mg | ORAL_TABLET | Freq: Once | ORAL | Status: AC
  Administered 2016-08-25: 4 mg via ORAL
  Filled 2016-08-25: qty 1

## 2016-08-25 NOTE — ED Triage Notes (Signed)
Pt c/o periumbilical pain, diarrhea, emesis onset this morning, seen at [facility] today for same today. Pt has ED care plan regarding frequent visits for abdominal pain.

## 2016-08-25 NOTE — ED Provider Notes (Signed)
[facility] DEPT Provider Note   CSN: [id number] Arrival date & time: 08/25/16  1116     History   Chief Complaint Chief Complaint  Patient presents with  . Abdominal Pain    HPI [name] is a 46 y.o. male presenting with abdominal pain since 5 AM this morning. He states that it is his pancreatitis and feels the same as it has before. He was seen at [facility] ED and states that he vomited twice after he got home. He now also endorses diarrhea as loose stools nonbloody. He has tried taking his pantoprazole and sucralfate without relief. He reports EtOH use yesterday. He also states that he saw his PCP 1 week ago. Denies fever, chills or other symptoms.  HPI  Past Medical History:  Diagnosis Date  . Alcoholism /alcohol abuse ([place])   . Anemia   . Anxiety   . Arthritis    "knees; arms; elbows" (03/26/2015)  . Asthma   . Bipolar disorder ([place])   . Chronic bronchitis ([place])   . Chronic lower back pain   . Cocaine abuse   . Depression   . Family history of adverse reaction to anesthesia    "grandmother gets confused"  . Femoral condyle fracture ([place]) 03/08/2014   left medial/notes 03/09/2014  . GERD (gastroesophageal reflux disease)   . H/O hiatal hernia   . H/O suicide attempt 10/2012  . Heart murmur    "when he was little" (03/06/2013)  . High cholesterol   . History of blood transfusion 10/2012   "when I tried to commit suicide"  . History of stomach ulcers   . Hypertension   . Marijuana abuse, continuous   . Migraine    "a few times/year" (03/26/2015)  . Pancreatitis   . Pneumonia 1990's X 3  . PTSD (post-traumatic stress disorder)   . Shortness of breath    "can happen at anytime" (03/06/2013)  . Sickle cell trait ([place])   . WPW (Wolff-Parkinson-White syndrome)    [name] 03/06/2013    Patient Active Problem List   Diagnosis Date Noted  . Alcohol use disorder, severe, dependence ([place]) 07/25/2016  . Cocaine use disorder, severe, dependence ([place])  07/25/2016  . Major depressive disorder, recurrent severe without psychotic features ([place]) 07/20/2016  . Leukocytosis   . Hospital acquired PNA 05/20/2015  . Pancreatitis 05/18/2015  . Pseudocyst of pancreas 05/18/2015  . Malnutrition of moderate degree 03/27/2015  . Abnormal transaminases 03/26/2015  . Polysubstance abuse (tobacco, cocaine, THC, and ETOH) 03/26/2015  . Acute pancreatitis 03/26/2015  . Alcohol-induced chronic pancreatitis ([place])   . Essential hypertension 02/06/2014  . Alcohol-induced acute pancreatitis 11/28/2013  . Pancreatic pseudocyst/cyst 11/25/2013  . Alcohol dependence ([place]) 10/23/2013  . MDD (major depressive disorder), recurrent severe, without psychosis ([place]) 10/22/2013  . Protein-calorie malnutrition, severe ([place]) 10/10/2013  . Suicide attempt ([place]) 10/08/2013  . 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 TOBACCO ABUSE 03/23/2007    Past Surgical History:  Procedure Laterality Date  . CARDIAC CATHETERIZATION    . EYE SURGERY Left 1990's   "result of trauma"   . FACIAL FRACTURE SURGERY Left 1990's   "result of trauma"   . FRACTURE SURGERY    . HERNIA REPAIR    . LEFT HEART CATHETERIZATION WITH CORONARY ANGIOGRAM Right 03/07/2013   Procedure: LEFT HEART CATHETERIZATION WITH CORONARY ANGIOGRAM;  Surgeon: [name], MD;  Location: [facility] CATH LAB;  Service: Cardiovascular;  Laterality: Right;  . UMBILICAL HERNIA REPAIR  Home Medications    Prior to Admission medications   Medication Sig Start Date End Date Taking? Authorizing Provider  albuterol (PROVENTIL HFA;VENTOLIN HFA) 108 (90 Base) MCG/ACT inhaler Inhale 2 puffs into the lungs every 6 (six) hours as needed for wheezing or shortness of breath. 07/25/16  Yes Encarnacion Slates, NP  cholestyramine (QUESTRAN) 4 g packet Take 1 packet (4 g total) by mouth 3 (three) times daily as needed (diarrhea). 07/25/16  Yes Encarnacion Slates, NP  famotidine (PEPCID) 20 MG tablet Take 1  tablet (20 mg total) by mouth 2 (two) times daily. 08/10/16  Yes Charlesetta Shanks, MD  fluticasone (FLONASE) 50 MCG/ACT nasal spray Place 1 spray into both nostrils daily as needed for rhinitis. 07/25/16  Yes Encarnacion Slates, NP  gabapentin (NEURONTIN) 300 MG capsule Take 1 capsule (300 mg total) by mouth 3 (three) times daily. For agitation 07/25/16  Yes Encarnacion Slates, NP  haloperidol (HALDOL) 5 MG tablet Take 1 tablet (5 mg total) by mouth 2 (two) times daily. For mood control 07/25/16  Yes Encarnacion Slates, NP  hydrocerin (EUCERIN) CREA Apply 1 application topically 2 (two) times daily. For dry skin 07/25/16  Yes Encarnacion Slates, NP  hydrOXYzine (ATARAX/VISTARIL) 25 MG tablet Take 1 tablet (25 mg total) by mouth every 6 (six) hours as needed for anxiety. 07/25/16  Yes Encarnacion Slates, NP  lipase/protease/amylase (CREON) 12000 units CPEP capsule Take 2 capsules (24,000 Units total) by mouth 3 (three) times daily with meals. For pancreatitis 07/25/16  Yes Encarnacion Slates, NP  loratadine (CLARITIN) 10 MG tablet Take 1 tablet (10 mg total) by mouth daily. (May purchase from over the counter): For allergies 07/26/16  Yes Encarnacion Slates, NP  metoprolol tartrate (LOPRESSOR) 25 MG tablet Take 1 tablet (25 mg total) by mouth 2 (two) times daily. For high blood pressure 07/25/16  Yes Encarnacion Slates, NP  naltrexone (DEPADE) 50 MG tablet Take 1 tablet (50 mg total) by mouth daily. For alcoholism 07/26/16  Yes Encarnacion Slates, NP  naproxen (NAPROSYN) 375 MG tablet Take 1 tablet (375 mg total) by mouth 2 (two) times daily. 08/04/16  Yes Volanda Napoleon, PA-C  nicotine polacrilex (NICORETTE) 2 MG gum Take 1 each (2 mg total) by mouth as needed for smoking cessation. 07/25/16  Yes Encarnacion Slates, NP  ondansetron (ZOFRAN) 4 MG tablet Take 1 tablet (4 mg total) by mouth every 8 (eight) hours as needed for nausea or vomiting. 07/25/16  Yes Encarnacion Slates, NP  oxyCODONE-acetaminophen (PERCOCET/ROXICET) 5-325 MG tablet TK 1 OR 2 TS PO Q  6 H PRF SEVERE  PAIN 06/12/16  Yes Historical Provider, MD  pantoprazole (PROTONIX) 40 MG tablet Take 40 mg by mouth daily.    Yes Historical Provider, MD  pantoprazole (PROTONIX) 40 MG tablet Take 40 mg by mouth daily.   Yes Historical Provider, MD  promethazine (PHENERGAN) 25 MG tablet Take 1 tablet (25 mg total) by mouth every 6 (six) hours as needed for nausea or vomiting. 08/25/16  Yes [name], PA-C  sertraline (ZOLOFT) 25 MG tablet Take 3 tablets (75 mg total) by mouth daily. For depression 07/26/16  Yes Encarnacion Slates, NP  sucralfate (CARAFATE) 1 g tablet Take 1 tablet (1 g total) by mouth 4 (four) times daily -  with meals and at bedtime. For acid reflux 07/25/16  Yes Encarnacion Slates, NP  benzocaine (ORAJEL) 10 % mucosal gel Use as directed in the  mouth or throat 4 (four) times daily as needed for mouth pain. Patient not taking: Reported on 08/25/2016 07/25/16   Encarnacion Slates, NP  sucralfate (CARAFATE) 1 g tablet Take 1 tablet (1 g total) by mouth 4 (four) times daily -  with meals and at bedtime. Patient not taking: Reported on 08/25/2016 08/10/16   Charlesetta Shanks, MD    Family History Family History  Problem Relation Age of Onset  . Hypertension Other   . Coronary artery disease Other     Social History Social History  Substance Use Topics  . Smoking status: Current Every Day Smoker    Packs/day: 1.50    Years: 30.00    Types: Cigarettes  . Smokeless tobacco: Current User    Types: Chew  . Alcohol use 7.2 oz/week    12 Cans of beer per week     Allergies   Shellfish-derived products; Trazodone and nefazodone; Robaxin [methocarbamol]; and Reglan [metoclopramide]   Review of Systems Review of Systems  Constitutional: Negative for chills and fever.  HENT: Negative for ear pain and sore throat.   Eyes: Negative for pain and visual disturbance.  Respiratory: Negative for cough and shortness of breath.   Cardiovascular: Negative for chest pain and palpitations.  Gastrointestinal:  Positive for abdominal pain, diarrhea, nausea and vomiting. Negative for abdominal distention, anal bleeding, blood in stool and constipation.  Genitourinary: Negative for difficulty urinating, dysuria, flank pain and hematuria.  Musculoskeletal: Negative for arthralgias, back pain, myalgias, neck pain and neck stiffness.  Skin: Negative for color change, pallor and rash.  Neurological: Negative for dizziness, tremors, seizures, syncope, weakness and numbness.     Physical Exam Updated Vital Signs BP (!) 161/107 (BP Location: Left Arm)   Pulse 100   Temp 97.8 F (36.6 C) (Oral)   Resp (!) 24   Ht _0  (1.753 m)   Wt 62.1 kg   SpO2 98%   BMI 20.23 kg/m   Physical Exam  Constitutional: He appears well-developed and well-nourished. No distress.  Patient is afebrile, nontoxic-appearing, sitting comfortably in bed in no acute distress.  HENT:  Head: Normocephalic and atraumatic.  Eyes: Conjunctivae and EOM are normal. Right eye exhibits no discharge. Left eye exhibits no discharge.  Neck: Normal range of motion. Neck supple.  Cardiovascular: Normal rate, regular rhythm and normal heart sounds.   No murmur heard. Pulmonary/Chest: Effort normal and breath sounds normal. No respiratory distress.  Abdominal: Soft. He exhibits no distension and no mass. There is tenderness. There is no rebound and no guarding.  Left epigastric tenderness palpation. Negative Murphy sign, negative McBurney's point tenderness, negative rebound, negative for emboli call tenderness, negative mass, no peritoneal signs.  Musculoskeletal: He exhibits no edema.  Neurological: He is alert.  Skin: Skin is warm and dry. No rash noted. He is not diaphoretic. No erythema. No pallor.  Psychiatric: He has a normal mood and affect.  Nursing note and vitals reviewed.    ED Treatments / Results  Labs (all labs ordered are listed, but only abnormal results are displayed) Labs Reviewed  CBC WITH DIFFERENTIAL/PLATELET  - Abnormal; Notable for the following:       Result Value   WBC 14.5 (*)    Hemoglobin 12.4 (*)    HCT 37.4 (*)    RDW 15.8 (*)    Neutro Abs 10.4 (*)    Monocytes Absolute 1.3 (*)    All other components within normal limits  LIPASE, BLOOD - Abnormal; Notable for  the following:    Lipase 199 (*)    All other components within normal limits  COMPREHENSIVE METABOLIC PANEL - Abnormal; Notable for the following:    Sodium 134 (*)    Chloride 96 (*)    Total Protein 9.3 (*)    AST 87 (*)    Alkaline Phosphatase 130 (*)    All other components within normal limits    EKG  EKG Interpretation None       Radiology No results found.  Procedures Procedures (including critical care time)  Medications Ordered in ED Medications  oxyCODONE-acetaminophen (PERCOCET/ROXICET) 5-325 MG per tablet 1 tablet (not administered)  ondansetron (ZOFRAN-ODT) disintegrating tablet 4 mg (4 mg Oral Given 08/25/16 1420)  acetaminophen (TYLENOL) tablet 650 mg (650 mg Oral Given 08/25/16 1420)  gi cocktail (Maalox,Lidocaine,Donnatal) (30 mLs Oral Given 08/25/16 1540)     Initial Impression / Assessment and Plan / ED Course  I have reviewed the triage vital signs and the nursing notes.  Pertinent labs & imaging results that were available during my care of the patient were reviewed by me and considered in my medical decision making (see chart for details).      Patient presents with epigastric pain radiating to the back which he attributes to his pancreatitis.Patient does report that his pain feels the same as prior episodes and knows he should not have been drinking last night.  He was seen a few hours ago at [facility] reports that when he went home he vomited more and now has diarrhea.  No labs this morning. Obtained labs this patient is really concerned that his lipase is elevated.  Had a long discussion with patient regarding counseling for substance abuse management and management of chronic  pain. Patient was agreeable and states that he wants to go to [place] and stop drinking. He asked if he could try another GI cocktail has it has helped him with the burning. He explains that he has recently noticed foods aggravating his pain such as coffee, chocolate and spicy foods. counseled patient on management of reflux symptoms.  Patient's presentation is consistent with exacerbated chronic abdominal pain. It is worse due to EtOH use yesterday and some foods. Symptoms appear to have elements consistent with acid reflux as well.   Urged him to follow up with PCP and seek treatment for alcohol abuse. Provided resources.  Labs showed elevated lipase and mildly elevated alk phos. Discussed results with Dr. Jeneen Rinks who recommends outpatient management.  Discharge home with GI and PCP follow up. patient was prescribed antiemetic at his ED visit this morning.   Discussed strict return precautions and advised to return to the emergency department if experiencing any new or worsening symptoms. Instructions were understood and patient agreed with discharge plan. Final Clinical Impressions(s) / ED Diagnoses   Final diagnoses:  Alcohol-induced chronic pancreatitis [facility])    New Prescriptions New Prescriptions   No medications on file     Dossie Der 08/25/16 [place], MD 08/27/16 [phone]

## 2016-08-25 NOTE — Discharge Instructions (Signed)
As discussed, please follow up with GI and your primary care provider. Use the resources provided for substance abuse. Start reintroducing foods very slowly stating with fluids only, broths, soups and slowly building up to blend foods.

## 2016-08-25 NOTE — ED Provider Notes (Signed)
[facility] DEPT Provider Note   CSN: [id number] Arrival date & time: 08/25/16  2330     History   Chief Complaint Chief Complaint  Patient presents with  . Abdominal Pain    HPI [name] is a 46 y.o. male.  HPI [name] is a 46 y.o. male with history of bipolar disorder, chronic pain, chronic abdominal pain, alcohol abuse, cocaine abuse, depression, presents to emergency department complaining of epigastric abdominal pain. Patient states his pain started this morning. He reports one episode of emesis. Normal bowel movement today. Denies any blood in his stool or emesis. He states he has history of chronic pancreatitis and pain feels the same. He states "I know my lipase is elevated today." Patient admits to drinking alcohol last night. Patient with numerous ED visits for the same. He did not try any medications prior to coming in. Nothing is making his symptoms better or worse  Past Medical History:  Diagnosis Date  . Alcoholism /alcohol abuse ([place])   . Anemia   . Anxiety   . Arthritis    "knees; arms; elbows" (03/26/2015)  . Asthma   . Bipolar disorder ([place])   . Chronic bronchitis ([place])   . Chronic lower back pain   . Cocaine abuse   . Depression   . Family history of adverse reaction to anesthesia    "grandmother gets confused"  . Femoral condyle fracture ([place]) 03/08/2014   left medial/notes 03/09/2014  . GERD (gastroesophageal reflux disease)   . H/O hiatal hernia   . H/O suicide attempt 10/2012  . Heart murmur    "when he was little" (03/06/2013)  . High cholesterol   . History of blood transfusion 10/2012   "when I tried to commit suicide"  . History of stomach ulcers   . Hypertension   . Marijuana abuse, continuous   . Migraine    "a few times/year" (03/26/2015)  . Pancreatitis   . Pneumonia 1990's X 3  . PTSD (post-traumatic stress disorder)   . Shortness of breath    "can happen at anytime" (03/06/2013)  . Sickle cell trait  ([place])   . WPW (Wolff-Parkinson-White syndrome)    [name] 03/06/2013    Patient Active Problem List   Diagnosis Date Noted  . Alcohol use disorder, severe, dependence ([place]) 07/25/2016  . Cocaine use disorder, severe, dependence ([place]) 07/25/2016  . Major depressive disorder, recurrent severe without psychotic features ([place]) 07/20/2016  . Leukocytosis   . Hospital acquired PNA 05/20/2015  . Pancreatitis 05/18/2015  . Pseudocyst of pancreas 05/18/2015  . Malnutrition of moderate degree 03/27/2015  . Abnormal transaminases 03/26/2015  . Polysubstance abuse (tobacco, cocaine, THC, and ETOH) 03/26/2015  . Acute pancreatitis 03/26/2015  . Alcohol-induced chronic pancreatitis ([place])   . Essential hypertension 02/06/2014  . Alcohol-induced acute pancreatitis 11/28/2013  . Pancreatic pseudocyst/cyst 11/25/2013  . Alcohol dependence ([place]) 10/23/2013  . MDD (major depressive disorder), recurrent severe, without psychosis ([place]) 10/22/2013  . Protein-calorie malnutrition, severe ([place]) 10/10/2013  . Suicide attempt ([place]) 10/08/2013  . 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 TOBACCO ABUSE 03/23/2007    Past Surgical History:  Procedure Laterality Date  . CARDIAC CATHETERIZATION    . EYE SURGERY Left 1990's   "result of trauma"   . FACIAL FRACTURE SURGERY Left 1990's   "result of trauma"   . FRACTURE SURGERY    . HERNIA REPAIR    . LEFT HEART CATHETERIZATION WITH CORONARY ANGIOGRAM Right 03/07/2013  Procedure: LEFT HEART CATHETERIZATION WITH CORONARY ANGIOGRAM;  Surgeon: [name], MD;  Location: [facility] CATH [facility];  Service: Cardiovascular;  Laterality: Right;  . UMBILICAL HERNIA REPAIR         Home Medications    Prior to Admission medications   Medication Sig Start Date End Date Taking? Authorizing Provider  albuterol (PROVENTIL HFA;VENTOLIN HFA) 108 (90 Base) MCG/ACT inhaler Inhale 2 puffs into the lungs every 6 (six) hours as needed for wheezing or  shortness of breath. 07/25/16   Encarnacion Slates, NP  benzocaine (ORAJEL) 10 % mucosal gel Use as directed in the mouth or throat 4 (four) times daily as needed for mouth pain. 07/25/16   Encarnacion Slates, NP  cholestyramine (QUESTRAN) 4 g packet Take 1 packet (4 g total) by mouth 3 (three) times daily as needed (diarrhea). 07/25/16   Encarnacion Slates, NP  famotidine (PEPCID) 20 MG tablet Take 1 tablet (20 mg total) by mouth 2 (two) times daily. For acid reflux 07/25/16   Encarnacion Slates, NP  famotidine (PEPCID) 20 MG tablet Take 1 tablet (20 mg total) by mouth 2 (two) times daily. 08/10/16   Charlesetta Shanks, MD  fluticasone (FLONASE) 50 MCG/ACT nasal spray Place 1 spray into both nostrils daily as needed for rhinitis. 07/25/16   Encarnacion Slates, NP  gabapentin (NEURONTIN) 300 MG capsule Take 1 capsule (300 mg total) by mouth 3 (three) times daily. For agitation 07/25/16   Encarnacion Slates, NP  haloperidol (HALDOL) 5 MG tablet Take 1 tablet (5 mg total) by mouth 2 (two) times daily. For mood control 07/25/16   Encarnacion Slates, NP  hydrocerin (EUCERIN) CREA Apply 1 application topically 2 (two) times daily. For dry skin 07/25/16   Encarnacion Slates, NP  hydrOXYzine (ATARAX/VISTARIL) 25 MG tablet Take 1 tablet (25 mg total) by mouth every 6 (six) hours as needed for anxiety. 07/25/16   Encarnacion Slates, NP  lipase/protease/amylase (CREON) 12000 units CPEP capsule Take 2 capsules (24,000 Units total) by mouth 3 (three) times daily with meals. For pancreatitis 07/25/16   Encarnacion Slates, NP  loratadine (CLARITIN) 10 MG tablet Take 1 tablet (10 mg total) by mouth daily. (May purchase from over the counter): For allergies 07/26/16   Encarnacion Slates, NP  metoprolol tartrate (LOPRESSOR) 25 MG tablet Take 1 tablet (25 mg total) by mouth 2 (two) times daily. For high blood pressure 07/25/16   Encarnacion Slates, NP  naltrexone (DEPADE) 50 MG tablet Take 1 tablet (50 mg total) by mouth daily. For alcoholism 07/26/16   Encarnacion Slates, NP  naproxen (NAPROSYN) 375  MG tablet Take 1 tablet (375 mg total) by mouth 2 (two) times daily. 08/04/16   Volanda Napoleon, PA-C  nicotine polacrilex (NICORETTE) 2 MG gum Take 1 each (2 mg total) by mouth as needed for smoking cessation. 07/25/16   Encarnacion Slates, NP  ondansetron (ZOFRAN) 4 MG tablet Take 1 tablet (4 mg total) by mouth every 8 (eight) hours as needed for nausea or vomiting. 07/25/16   Encarnacion Slates, NP  oxyCODONE-acetaminophen (PERCOCET/ROXICET) 5-325 MG tablet TK 1 OR 2 TS PO Q  6 H PRF SEVERE PAIN 06/12/16   Historical Provider, MD  pantoprazole (PROTONIX) 20 MG tablet Take 2 tablets (40 mg total) by mouth daily. For acid reflux Patient not taking: Reported on 08/04/2016 07/25/16   Encarnacion Slates, NP  pantoprazole (PROTONIX) 20 MG tablet Take 1 tablet (20 mg  total) by mouth daily. 08/10/16   Charlesetta Shanks, MD  pantoprazole (PROTONIX) 40 MG tablet Take 40 mg by mouth daily.     Historical Provider, MD  sertraline (ZOLOFT) 25 MG tablet Take 3 tablets (75 mg total) by mouth daily. For depression 07/26/16   Encarnacion Slates, NP  sucralfate (CARAFATE) 1 g tablet Take 1 tablet (1 g total) by mouth 4 (four) times daily -  with meals and at bedtime. For acid reflux 07/25/16   Encarnacion Slates, NP  sucralfate (CARAFATE) 1 g tablet Take 1 tablet (1 g total) by mouth 4 (four) times daily -  with meals and at bedtime. 08/10/16   Charlesetta Shanks, MD    Family History Family History  Problem Relation Age of Onset  . Hypertension Other   . Coronary artery disease Other     Social History Social History  Substance Use Topics  . Smoking status: Current Every Day Smoker    Packs/day: 1.50    Years: 30.00    Types: Cigarettes  . Smokeless tobacco: Current User    Types: Chew  . Alcohol use 7.2 oz/week    12 Cans of beer per week     Allergies   Shellfish-derived products; Trazodone and nefazodone; Robaxin [methocarbamol]; and Reglan [metoclopramide]   Review of Systems Review of Systems  Constitutional: Negative for chills  and fever.  Respiratory: Negative for cough, chest tightness and shortness of breath.   Cardiovascular: Negative for chest pain, palpitations and leg swelling.  Gastrointestinal: Positive for abdominal pain, nausea and vomiting. Negative for abdominal distention and diarrhea.  Genitourinary: Negative for dysuria, frequency, hematuria and urgency.  Musculoskeletal: Negative for arthralgias, myalgias, neck pain and neck stiffness.  Skin: Negative for rash.  Allergic/Immunologic: Negative for immunocompromised state.  Neurological: Negative for dizziness, weakness, light-headedness, numbness and headaches.  All other systems reviewed and are negative.    Physical Exam Updated Vital Signs BP (!) 165/101 (BP Location: Right Arm)   Pulse 90   Temp 98 F (36.7 C) (Oral)   Resp 18   Ht 5\' 9"  (1.753 m)   Wt 68 kg   SpO2 97%   BMI 22.15 kg/m   Physical Exam  Constitutional: He appears well-developed and well-nourished. No distress.  HENT:  Head: Normocephalic and atraumatic.  Eyes: Conjunctivae are normal.  Neck: Neck supple.  Cardiovascular: Normal rate, regular rhythm and normal heart sounds.   Pulmonary/Chest: Effort normal. No respiratory distress. He has no wheezes. He has no rales.  Abdominal: Soft. Bowel sounds are normal. He exhibits no distension. There is tenderness. There is no rebound and no guarding.  Epigastric   Musculoskeletal: He exhibits no edema.  Neurological: He is alert.  Skin: Skin is warm and dry.  Nursing note and vitals reviewed.    ED Treatments / Results  Labs (all labs ordered are listed, but only abnormal results are displayed) Labs Reviewed - No data to display  EKG  EKG Interpretation None       Radiology No results found.  Procedures Procedures (including critical care time)  Medications Ordered in ED Medications  gi cocktail (Maalox,Lidocaine,Donnatal) (not administered)  ketorolac (TORADOL) injection 30 mg (not administered)    sucralfate (CARAFATE) tablet 1 g (not administered)     Initial Impression / Assessment and Plan / ED Course  I have reviewed the triage vital signs and the nursing notes.  Pertinent labs & imaging results that were available during my care of the patient were reviewed  by me and considered in my medical decision making (see chart for details).     Patient with acute on chronic epigastric pain. Admits to alcohol drinking last night. History of pancreatitis. Patient is a frequent visitor to emergency department for this abdominal pain. Patient does have a care plan which I reviewed. This is patient's 20th visit to emergency department in the last 6 months. His vital signs today are normal. He is afebrile. There is no tachycardia or hypotension to suggest sepsis. His abdomen is soft, mildly tender in epigastric area, highly doubt surgical abdomen, perforated ulcer, free air. I do believe this could be gastritis versus pancreatitis. Patient does not appear to be dehydrated. Do not think that obtaining blood work today would be beneficial. I explained to him that if his lipase is elevated, and he is not vomiting, outpatient treatment is still appropriate with liquid diet. We will try GI cocktail, Carafate, Toradol for pain.   8:53 AM Patient's pain did not improve with Toradol, Carafate Cocktail. Will try Tylenol and Zofran.   10:00 AM Feeling better. No vomiting. Will dc home with clear liquid diet, phenergan, follow up with pcp. Instructed to stop drinking.   Vitals:   08/25/16 0845 08/25/16 0915 08/25/16 0930 08/25/16 0945  BP: (!) 144/100 (!) 157/96 (!) 140/96 (!) 143/93  Pulse: 95 89 89 87  Resp:      Temp:      TempSrc:      SpO2: 98% 98% 97% 92%  Weight:      Height:        Final Clinical Impressions(s) / ED Diagnoses   Final diagnoses:  Epigastric pain    New Prescriptions New Prescriptions   PROMETHAZINE (PHENERGAN) 25 MG TABLET    Take 1 tablet (25 mg total) by mouth  every 6 (six) hours as needed for nausea or vomiting.     Jeannett Senior, PA-C 08/25/16 [place], DO 08/25/16 1015

## 2016-08-25 NOTE — ED Notes (Signed)
Pt called out for pain med, -- PA notified

## 2016-08-25 NOTE — ED Triage Notes (Addendum)
Pt arrives via [facility] for c/o n/v/LLQ pain that began around 0500 this am. Hx of chronic pancreatitis, states his medication for pancreatitis is not working. Pt a/ox4, VSS, nad. Reports drinking 2-24oz beers yesterday.

## 2016-08-25 NOTE — ED Notes (Signed)
Pt given bus pass per request

## 2016-08-25 NOTE — ED Triage Notes (Signed)
Per EMS, pt was seen here today and d/c after receiving pain meds and gi cocktail, pt went home and tried to eat soup and states his abd pain, nausea and diarrhea came back. EMS VS 184/92, HR 102, RR 18, CBG 91.

## 2016-08-25 NOTE — ED Notes (Signed)
Pt called to room, not in lobby

## 2016-08-25 NOTE — Discharge Instructions (Signed)
Continue your medications. Take phenergan as prescribed as needed for nausea and vomiting. Avoid alcohol. Clear liquid diet for the next 48 hrs. Follow up as needed

## 2016-08-26 LAB — CBC WITH DIFFERENTIAL/PLATELET
BASOS ABS: 0 10*3/uL (ref 0.0–0.1)
Basophils Relative: 0 %
Eosinophils Absolute: 0.4 10*3/uL (ref 0.0–0.7)
Eosinophils Relative: 3 %
HEMATOCRIT: 32.8 % — AB (ref 39.0–52.0)
HEMOGLOBIN: 10.8 g/dL — AB (ref 13.0–17.0)
Lymphocytes Relative: 20 %
Lymphs Abs: 2.5 10*3/uL (ref 0.7–4.0)
MCH: 26.9 pg (ref 26.0–34.0)
MCHC: 32.9 g/dL (ref 30.0–36.0)
MCV: 81.8 fL (ref 78.0–100.0)
MONOS PCT: 9 %
Monocytes Absolute: 1.1 10*3/uL — ABNORMAL HIGH (ref 0.1–1.0)
NEUTROS ABS: 8.6 10*3/uL — AB (ref 1.7–7.7)
NEUTROS PCT: 68 %
Platelets: 212 10*3/uL (ref 150–400)
RBC: 4.01 MIL/uL — AB (ref 4.22–5.81)
RDW: 16 % — ABNORMAL HIGH (ref 11.5–15.5)
WBC: 12.6 10*3/uL — ABNORMAL HIGH (ref 4.0–10.5)

## 2016-08-26 LAB — LIPASE, BLOOD: LIPASE: 160 U/L — AB (ref 11–51)

## 2016-08-26 MED ORDER — HYDROCODONE-ACETAMINOPHEN 5-325 MG PO TABS
1.0000 | ORAL_TABLET | Freq: Once | ORAL | Status: AC
  Administered 2016-08-26: 1 via ORAL
  Filled 2016-08-26: qty 1

## 2016-08-26 MED ORDER — ONDANSETRON HCL 4 MG PO TABS: 4.0000 mg | ORAL_TABLET | Freq: Three times a day (TID) | ORAL | 0 refills | Status: DC | PRN

## 2016-08-26 MED ORDER — ONDANSETRON 4 MG PO TBDP
4.0000 mg | ORAL_TABLET | Freq: Once | ORAL | Status: AC
  Administered 2016-08-26: 4 mg via ORAL
  Filled 2016-08-26: qty 1

## 2016-08-26 MED ORDER — HYDROCODONE-ACETAMINOPHEN 5-325 MG PO TABS: 1.0000 | ORAL_TABLET | Freq: Four times a day (QID) | ORAL | 0 refills | Status: DC | PRN

## 2016-08-26 NOTE — Discharge Instructions (Signed)
Please follow a clear liquid diet for the next 48 hours, then slowly add foods back in with thickened soups and bland foods over the next 48 hours into your consuming your normal diet

## 2016-08-26 NOTE — ED Provider Notes (Signed)
[facility] DEPT Provider Note   CSN: [id number] Arrival date & time: 08/25/16  2005     History   Chief Complaint Chief Complaint  Patient presents with  . Abdominal Pain    HPI [name] is a 46 y.o. male.  This is the third visit today for [name], who has chronic pancreatitis.  He states when he was discharged the last time he's had 2 episodes of vomiting after trying to eat some soup.      Past Medical History:  Diagnosis Date  . Alcoholism /alcohol abuse ([place])   . Anemia   . Anxiety   . Arthritis    "knees; arms; elbows" (03/26/2015)  . Asthma   . Bipolar disorder ([place])   . Chronic bronchitis ([place])   . Chronic lower back pain   . Cocaine abuse   . Depression   . Family history of adverse reaction to anesthesia    "grandmother gets confused"  . Femoral condyle fracture ([place]) 03/08/2014   left medial/notes 03/09/2014  . GERD (gastroesophageal reflux disease)   . H/O hiatal hernia   . H/O suicide attempt 10/2012  . Heart murmur    "when he was little" (03/06/2013)  . High cholesterol   . History of blood transfusion 10/2012   "when I tried to commit suicide"  . History of stomach ulcers   . Hypertension   . Marijuana abuse, continuous   . Migraine    "a few times/year" (03/26/2015)  . Pancreatitis   . Pneumonia 1990's X 3  . PTSD (post-traumatic stress disorder)   . Shortness of breath    "can happen at anytime" (03/06/2013)  . Sickle cell trait ([place])   . WPW (Wolff-Parkinson-White syndrome)    [name] 03/06/2013    Patient Active Problem List   Diagnosis Date Noted  . Alcohol use disorder, severe, dependence ([place]) 07/25/2016  . Cocaine use disorder, severe, dependence ([place]) 07/25/2016  . Major depressive disorder, recurrent severe without psychotic features ([place]) 07/20/2016  . Leukocytosis   . Hospital acquired PNA 05/20/2015  . Pancreatitis 05/18/2015  . Pseudocyst of pancreas 05/18/2015  . Malnutrition of moderate degree  03/27/2015  . Abnormal transaminases 03/26/2015  . Polysubstance abuse (tobacco, cocaine, THC, and ETOH) 03/26/2015  . Acute pancreatitis 03/26/2015  . Alcohol-induced chronic pancreatitis ([place])   . Essential hypertension 02/06/2014  . Alcohol-induced acute pancreatitis 11/28/2013  . Pancreatic pseudocyst/cyst 11/25/2013  . Alcohol dependence ([place]) 10/23/2013  . MDD (major depressive disorder), recurrent severe, without psychosis ([place]) 10/22/2013  . Protein-calorie malnutrition, severe ([place]) 10/10/2013  . Suicide attempt ([place]) 10/08/2013  . 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 TOBACCO ABUSE 03/23/2007    Past Surgical History:  Procedure Laterality Date  . CARDIAC CATHETERIZATION    . EYE SURGERY Left 1990's   "result of trauma"   . FACIAL FRACTURE SURGERY Left 1990's   "result of trauma"   . FRACTURE SURGERY    . HERNIA REPAIR    . LEFT HEART CATHETERIZATION WITH CORONARY ANGIOGRAM Right 03/07/2013   Procedure: LEFT HEART CATHETERIZATION WITH CORONARY ANGIOGRAM;  Surgeon: [name], MD;  Location: [facility]ATH [facility];  Service: Cardiovascular;  Laterality: Right;  . UMBILICAL HERNIA REPAIR         Home Medications    Prior to Admission medications   Medication Sig Start Date End Date Taking? Authorizing Provider  albuterol (PROVENTIL HFA;VENTOLIN HFA) 108 (90 Base) MCG/ACT inhaler Inhale 2 puffs into the lungs every  6 (six) hours as needed for wheezing or shortness of breath. 07/25/16   Encarnacion Slates, NP  benzocaine (ORAJEL) 10 % mucosal gel Use as directed in the mouth or throat 4 (four) times daily as needed for mouth pain. Patient not taking: Reported on 08/25/2016 07/25/16   Encarnacion Slates, NP  cholestyramine Lucrezia Starch) 4 g packet Take 1 packet (4 g total) by mouth 3 (three) times daily as needed (diarrhea). 07/25/16   Encarnacion Slates, NP  famotidine (PEPCID) 20 MG tablet Take 1 tablet (20 mg total) by mouth 2 (two) times daily. 08/10/16   Charlesetta Shanks, MD  fluticasone (FLONASE) 50 MCG/ACT nasal spray Place 1 spray into both nostrils daily as needed for rhinitis. 07/25/16   Encarnacion Slates, NP  gabapentin (NEURONTIN) 300 MG capsule Take 1 capsule (300 mg total) by mouth 3 (three) times daily. For agitation 07/25/16   Encarnacion Slates, NP  haloperidol (HALDOL) 5 MG tablet Take 1 tablet (5 mg total) by mouth 2 (two) times daily. For mood control 07/25/16   Encarnacion Slates, NP  hydrocerin (EUCERIN) CREA Apply 1 application topically 2 (two) times daily. For dry skin 07/25/16   Encarnacion Slates, NP  HYDROcodone-acetaminophen (NORCO/VICODIN) 5-325 MG tablet Take 1 tablet by mouth every 6 (six) hours as needed. 08/26/16   Junius Creamer, NP  hydrOXYzine (ATARAX/VISTARIL) 25 MG tablet Take 1 tablet (25 mg total) by mouth every 6 (six) hours as needed for anxiety. 07/25/16   Encarnacion Slates, NP  lipase/protease/amylase (CREON) 12000 units CPEP capsule Take 2 capsules (24,000 Units total) by mouth 3 (three) times daily with meals. For pancreatitis 07/25/16   Encarnacion Slates, NP  loratadine (CLARITIN) 10 MG tablet Take 1 tablet (10 mg total) by mouth daily. (May purchase from over the counter): For allergies 07/26/16   Encarnacion Slates, NP  metoprolol tartrate (LOPRESSOR) 25 MG tablet Take 1 tablet (25 mg total) by mouth 2 (two) times daily. For high blood pressure 07/25/16   Encarnacion Slates, NP  naltrexone (DEPADE) 50 MG tablet Take 1 tablet (50 mg total) by mouth daily. For alcoholism 07/26/16   Encarnacion Slates, NP  naproxen (NAPROSYN) 375 MG tablet Take 1 tablet (375 mg total) by mouth 2 (two) times daily. 08/04/16   Volanda Napoleon, PA-C  nicotine polacrilex (NICORETTE) 2 MG gum Take 1 each (2 mg total) by mouth as needed for smoking cessation. 07/25/16   Encarnacion Slates, NP  ondansetron (ZOFRAN) 4 MG tablet Take 1 tablet (4 mg total) by mouth every 8 (eight) hours as needed for nausea or vomiting. 08/26/16   Junius Creamer, NP  oxyCODONE-acetaminophen (PERCOCET/ROXICET) 5-325 MG tablet  TK 1 OR 2 TS PO Q  6 H PRF SEVERE PAIN 06/12/16   Historical Provider, MD  pantoprazole (PROTONIX) 40 MG tablet Take 40 mg by mouth daily.     Historical Provider, MD  pantoprazole (PROTONIX) 40 MG tablet Take 40 mg by mouth daily.    Historical Provider, MD  promethazine (PHENERGAN) 25 MG tablet Take 1 tablet (25 mg total) by mouth every 6 (six) hours as needed for nausea or vomiting. 08/25/16   [name], PA-C  sertraline (ZOLOFT) 25 MG tablet Take 3 tablets (75 mg total) by mouth daily. For depression 07/26/16   Encarnacion Slates, NP  sucralfate (CARAFATE) 1 g tablet Take 1 tablet (1 g total) by mouth 4 (four) times daily -  with meals and at bedtime.  For acid reflux 07/25/16   Encarnacion Slates, NP  sucralfate (CARAFATE) 1 g tablet Take 1 tablet (1 g total) by mouth 4 (four) times daily -  with meals and at bedtime. Patient not taking: Reported on 08/25/2016 08/10/16   Charlesetta Shanks, MD    Family History Family History  Problem Relation Age of Onset  . Hypertension Other   . Coronary artery disease Other     Social History Social History  Substance Use Topics  . Smoking status: Current Every Day Smoker    Packs/day: 1.50    Years: 30.00    Types: Cigarettes  . Smokeless tobacco: Current User    Types: Chew  . Alcohol use 7.2 oz/week    12 Cans of beer per week     Comment: last drink 08/23/16     Allergies   Shellfish-derived products; Trazodone and nefazodone; Robaxin [methocarbamol]; and Reglan [metoclopramide]   Review of Systems Review of Systems  Constitutional: Positive for appetite change.  Gastrointestinal: Positive for abdominal pain, nausea and vomiting.     Physical Exam Updated Vital Signs BP (!) 151/112 (BP Location: Left Arm)   Pulse 100   Temp 98.5 F (36.9 C) (Oral)   Resp 16   Ht 5\' 9"  (1.753 m)   Wt 62.1 kg   SpO2 98%   BMI 20.23 kg/m   Physical Exam  Constitutional: He appears well-developed. He appears distressed.  HENT:  Head:  Normocephalic.  Eyes: Pupils are equal, round, and reactive to light.  Neck: Normal range of motion.  Cardiovascular: Normal rate.   Pulmonary/Chest: Effort normal.  Abdominal: He exhibits no distension. There is tenderness.  Musculoskeletal: Normal range of motion.  Neurological: He is alert.  Skin: Skin is warm.  Psychiatric: He has a normal mood and affect.  Nursing note and vitals reviewed.    ED Treatments / Results  Labs (all labs ordered are listed, but only abnormal results are displayed) Labs Reviewed  LIPASE, BLOOD - Abnormal; Notable for the following:       Result Value   Lipase 160 (*)    All other components within normal limits  CBC WITH DIFFERENTIAL/PLATELET - Abnormal; Notable for the following:    WBC 12.6 (*)    RBC 4.01 (*)    Hemoglobin 10.8 (*)    HCT 32.8 (*)    RDW 16.0 (*)    Neutro Abs 8.6 (*)    Monocytes Absolute 1.1 (*)    All other components within normal limits    EKG  EKG Interpretation None       Radiology No results found.  Procedures Procedures (including critical care time)  Medications Ordered in ED Medications  sodium chloride 0.9 % bolus 1,000 mL (1,000 mLs Intravenous New Bag/Given 08/26/16 0016)  HYDROcodone-acetaminophen (NORCO/VICODIN) 5-325 MG per tablet 1 tablet (not administered)  ondansetron (ZOFRAN-ODT) disintegrating tablet 4 mg (not administered)  morphine 4 MG/ML injection 4 mg (4 mg Intravenous Given 08/26/16 0018)  ondansetron (ZOFRAN) injection 4 mg (4 mg Intravenous Given 08/26/16 0017)     Initial Impression / Assessment and Plan / ED Course  I have reviewed the triage vital signs and the nursing notes.  Pertinent labs & imaging results that were available during my care of the patient were reviewed by me and considered in my medical decision making (see chart for details).      Patient's lipase is elevated at 166, which is an improvement from his previous discharge of 199.  His white count has  improved as well.  He'll be discharged home with a prescription for Vicodin and Zofran and instructions to follow a clear liquid diet for the next 48 hours and slowly add back in  Final Clinical Impressions(s) / ED Diagnoses   Final diagnoses:  Chronic pancreatitis, unspecified pancreatitis type (HCC)  Nausea    New Prescriptions New Prescriptions   HYDROCODONE-ACETAMINOPHEN (NORCO/VICODIN) 5-325 MG TABLET    Take 1 tablet by mouth every 6 (six) hours as needed.     Junius Creamer, NP 08/26/16 0140    Isla Pence, MD 08/26/16 [phone]

## 2016-08-26 NOTE — ED Notes (Signed)
Pt verbalized understanding of d/c instructions and has no further questions. Pt stable and NAD. Pt given bus pass to go home with.
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
## 2016-09-24 ENCOUNTER — Emergency Department (HOSPITAL_COMMUNITY)
Admission: EM | Admit: 2016-09-24 | Discharge: 2016-09-24 | Disposition: A | Discharge status: Home / Self Care | Payer: Self-pay | Attending: Emergency Medicine | Admitting: Emergency Medicine

## 2016-09-24 ENCOUNTER — Encounter (HOSPITAL_COMMUNITY): Payer: Self-pay | Admitting: Emergency Medicine

## 2016-09-24 DIAGNOSIS — K2900 Acute gastritis without bleeding: Secondary | ICD-10-CM | POA: Insufficient documentation

## 2016-09-24 DIAGNOSIS — R112 Nausea with vomiting, unspecified: Secondary | ICD-10-CM | POA: Insufficient documentation

## 2016-09-24 DIAGNOSIS — I10 Essential (primary) hypertension: Secondary | ICD-10-CM | POA: Insufficient documentation

## 2016-09-24 DIAGNOSIS — R630 Anorexia: Secondary | ICD-10-CM | POA: Insufficient documentation

## 2016-09-24 DIAGNOSIS — R197 Diarrhea, unspecified: Secondary | ICD-10-CM | POA: Insufficient documentation

## 2016-09-24 DIAGNOSIS — F17200 Nicotine dependence, unspecified, uncomplicated: Secondary | ICD-10-CM | POA: Insufficient documentation

## 2016-09-24 DIAGNOSIS — Z79899 Other long term (current) drug therapy: Secondary | ICD-10-CM | POA: Insufficient documentation

## 2016-09-24 LAB — URINALYSIS, ROUTINE W REFLEX MICROSCOPIC
Bacteria, UA: NONE SEEN
Bilirubin Urine: NEGATIVE
Glucose, UA: NEGATIVE mg/dL
Hgb urine dipstick: NEGATIVE
Ketones, ur: NEGATIVE mg/dL
LEUKOCYTES UA: NEGATIVE
Nitrite: NEGATIVE
Protein, ur: 30 mg/dL — AB
Specific Gravity, Urine: 1.03 (ref 1.005–1.030)
pH: 5 (ref 5.0–8.0)

## 2016-09-24 LAB — CBC
HEMATOCRIT: 36.7 % — AB (ref 39.0–52.0)
HEMOGLOBIN: 11.9 g/dL — AB (ref 13.0–17.0)
MCH: 27.9 pg (ref 26.0–34.0)
MCHC: 32.4 g/dL (ref 30.0–36.0)
MCV: 85.9 fL (ref 78.0–100.0)
Platelets: 186 10*3/uL (ref 150–400)
RBC: 4.27 MIL/uL (ref 4.22–5.81)
RDW: 18.5 % — ABNORMAL HIGH (ref 11.5–15.5)
WBC: 7.4 10*3/uL (ref 4.0–10.5)

## 2016-09-24 LAB — COMPREHENSIVE METABOLIC PANEL
ALT: 51 U/L (ref 17–63)
ANION GAP: 10 (ref 5–15)
AST: 54 U/L — ABNORMAL HIGH (ref 15–41)
Albumin: 4.7 g/dL (ref 3.5–5.0)
Alkaline Phosphatase: 103 U/L (ref 38–126)
BUN: 7 mg/dL (ref 6–20)
CHLORIDE: 109 mmol/L (ref 101–111)
CO2: 16 mmol/L — AB (ref 22–32)
CREATININE: 0.72 mg/dL (ref 0.61–1.24)
Calcium: 9.7 mg/dL (ref 8.9–10.3)
GFR calc non Af Amer: >60 mL/min (ref >60)
Glucose, Bld: 96 mg/dL (ref 65–99)
POTASSIUM: 3.9 mmol/L (ref 3.5–5.1)
SODIUM: 135 mmol/L (ref 135–145)
Total Bilirubin: 1.1 mg/dL (ref 0.3–1.2)
Total Protein: 8.2 g/dL — ABNORMAL HIGH (ref 6.5–8.1)

## 2016-09-24 LAB — I-STAT TROPONIN, ED: TROPONIN I, POC: 0 ng/mL (ref 0.00–0.08)

## 2016-09-24 LAB — LIPASE, BLOOD: LIPASE: 28 U/L (ref 11–51)

## 2016-09-24 MED ORDER — SUCRALFATE 1 G PO TABS: 1.0000 g | ORAL_TABLET | Freq: Three times a day (TID) | ORAL | 3 refills | Status: DC

## 2016-09-24 MED ORDER — SUCRALFATE 1 G PO TABS
1.0000 g | ORAL_TABLET | Freq: Once | ORAL | Status: AC
  Administered 2016-09-24: 1 g via ORAL
  Filled 2016-09-24: qty 1

## 2016-09-24 MED ORDER — ONDANSETRON HCL 4 MG/2ML IJ SOLN
4.0000 mg | Freq: Once | INTRAMUSCULAR | Status: AC
  Administered 2016-09-24: 4 mg via INTRAVENOUS
  Filled 2016-09-24: qty 2

## 2016-09-24 MED ORDER — GI COCKTAIL ~~LOC~~
30.0000 mL | Freq: Once | ORAL | Status: AC
  Administered 2016-09-24: 30 mL via ORAL
  Filled 2016-09-24: qty 30

## 2016-09-24 MED ORDER — MORPHINE SULFATE (PF) 4 MG/ML IV SOLN
4.0000 mg | Freq: Once | INTRAVENOUS | Status: AC
  Administered 2016-09-24: 4 mg via INTRAVENOUS
  Filled 2016-09-24: qty 1

## 2016-09-24 MED ORDER — SODIUM CHLORIDE 0.9 % IV BOLUS (SEPSIS)
1000.0000 mL | Freq: Once | INTRAVENOUS | Status: AC
  Administered 2016-09-24: 1000 mL via INTRAVENOUS

## 2016-09-24 MED ORDER — PANTOPRAZOLE SODIUM 40 MG PO TBEC: 40.0000 mg | DELAYED_RELEASE_TABLET | Freq: Every day | ORAL | 0 refills | Status: DC

## 2016-09-24 MED ORDER — PANTOPRAZOLE SODIUM 40 MG IV SOLR
40.0000 mg | Freq: Once | INTRAVENOUS | Status: AC
  Administered 2016-09-24: 40 mg via INTRAVENOUS
  Filled 2016-09-24: qty 40

## 2016-09-24 MED ORDER — ONDANSETRON HCL 4 MG PO TABS: 4.0000 mg | ORAL_TABLET | Freq: Three times a day (TID) | ORAL | 0 refills | Status: DC | PRN
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
## 2016-09-24 NOTE — ED Notes (Signed)
Pt stable, understands discharge instructions, and reasons for return.   

## 2016-09-24 NOTE — Discharge Instructions (Signed)
Avoid alcohol and all NSAIDs. Take medications as prescribed. Gradually advance diet from clear liquids to bland food as tolerated. Follow-up with gastroenterology.

## 2016-09-24 NOTE — ED Triage Notes (Signed)
Per EMS: pt from home c/o abd pain and some left foot pain; pt seen here for same in past

## 2016-09-26 ENCOUNTER — Emergency Department (HOSPITAL_COMMUNITY)
Admission: EM | Admit: 2016-09-26 | Discharge: 2016-09-26 | Disposition: A | Discharge status: Home / Self Care | Payer: Self-pay | Attending: Emergency Medicine | Admitting: Emergency Medicine

## 2016-09-26 ENCOUNTER — Encounter (HOSPITAL_COMMUNITY): Payer: Self-pay | Admitting: Nurse Practitioner

## 2016-09-26 ENCOUNTER — Encounter (HOSPITAL_COMMUNITY): Payer: Self-pay | Admitting: Emergency Medicine

## 2016-09-26 DIAGNOSIS — I10 Essential (primary) hypertension: Secondary | ICD-10-CM | POA: Insufficient documentation

## 2016-09-26 DIAGNOSIS — F17210 Nicotine dependence, cigarettes, uncomplicated: Secondary | ICD-10-CM | POA: Insufficient documentation

## 2016-09-26 DIAGNOSIS — R109 Unspecified abdominal pain: Secondary | ICD-10-CM

## 2016-09-26 DIAGNOSIS — J45909 Unspecified asthma, uncomplicated: Secondary | ICD-10-CM | POA: Insufficient documentation

## 2016-09-26 DIAGNOSIS — R1013 Epigastric pain: Secondary | ICD-10-CM | POA: Insufficient documentation

## 2016-09-26 DIAGNOSIS — G8929 Other chronic pain: Secondary | ICD-10-CM | POA: Insufficient documentation

## 2016-09-26 DIAGNOSIS — K860 Alcohol-induced chronic pancreatitis: Secondary | ICD-10-CM | POA: Insufficient documentation

## 2016-09-26 DIAGNOSIS — Z79899 Other long term (current) drug therapy: Secondary | ICD-10-CM | POA: Insufficient documentation

## 2016-09-26 LAB — COMPREHENSIVE METABOLIC PANEL
ALT: 38 U/L (ref 17–63)
AST: 28 U/L (ref 15–41)
Albumin: 4.5 g/dL (ref 3.5–5.0)
Alkaline Phosphatase: 104 U/L (ref 38–126)
Anion gap: 10 (ref 5–15)
CHLORIDE: 104 mmol/L (ref 101–111)
CO2: 21 mmol/L — ABNORMAL LOW (ref 22–32)
CREATININE: 0.6 mg/dL — AB (ref 0.61–1.24)
Calcium: 9.5 mg/dL (ref 8.9–10.3)
GFR calc Af Amer: >60 mL/min (ref >60)
GFR calc non Af Amer: >60 mL/min (ref >60)
GLUCOSE: 107 mg/dL — AB (ref 65–99)
Potassium: 3.2 mmol/L — ABNORMAL LOW (ref 3.5–5.1)
SODIUM: 135 mmol/L (ref 135–145)
TOTAL PROTEIN: 8.1 g/dL (ref 6.5–8.1)
Total Bilirubin: 0.8 mg/dL (ref 0.3–1.2)

## 2016-09-26 LAB — CBC
HEMATOCRIT: 37.4 % — AB (ref 39.0–52.0)
Hemoglobin: 12.1 g/dL — ABNORMAL LOW (ref 13.0–17.0)
MCH: 27.5 pg (ref 26.0–34.0)
MCHC: 32.4 g/dL (ref 30.0–36.0)
MCV: 85 fL (ref 78.0–100.0)
PLATELETS: 204 10*3/uL (ref 150–400)
RBC: 4.4 MIL/uL (ref 4.22–5.81)
RDW: 18.2 % — AB (ref 11.5–15.5)
WBC: 8.9 10*3/uL (ref 4.0–10.5)

## 2016-09-26 LAB — LIPASE, BLOOD: LIPASE: 309 U/L — AB (ref 11–51)

## 2016-09-26 LAB — URINALYSIS, ROUTINE W REFLEX MICROSCOPIC
BILIRUBIN URINE: NEGATIVE
GLUCOSE, UA: NEGATIVE mg/dL
Hgb urine dipstick: NEGATIVE
KETONES UR: NEGATIVE mg/dL
LEUKOCYTES UA: NEGATIVE
NITRITE: NEGATIVE
PH: 6 (ref 5.0–8.0)
PROTEIN: NEGATIVE mg/dL
Specific Gravity, Urine: 1.008 (ref 1.005–1.030)

## 2016-09-26 MED ORDER — HALOPERIDOL LACTATE 5 MG/ML IJ SOLN
2.0000 mg | Freq: Once | INTRAMUSCULAR | Status: AC
  Administered 2016-09-26: 2 mg via INTRAVENOUS
  Filled 2016-09-26: qty 1

## 2016-09-26 MED ORDER — PROMETHAZINE HCL 50 MG RE SUPP
50.0000 mg | Freq: Four times a day (QID) | RECTAL | Status: DC | PRN
  Filled 2016-09-26: qty 1

## 2016-09-26 MED ORDER — SUCRALFATE 1 G PO TABS: 1.0000 g | ORAL_TABLET | Freq: Three times a day (TID) | ORAL | 0 refills | Status: DC

## 2016-09-26 MED ORDER — GI COCKTAIL ~~LOC~~
30.0000 mL | Freq: Once | ORAL | Status: AC
  Administered 2016-09-26: 30 mL via ORAL
  Filled 2016-09-26: qty 30

## 2016-09-26 MED ORDER — ONDANSETRON 4 MG PO TBDP: 4.0000 mg | ORAL_TABLET | Freq: Three times a day (TID) | ORAL | 0 refills | Status: DC | PRN

## 2016-09-26 MED ORDER — ONDANSETRON HCL 4 MG/2ML IJ SOLN
4.0000 mg | Freq: Once | INTRAMUSCULAR | Status: AC
  Administered 2016-09-26: 4 mg via INTRAVENOUS
  Filled 2016-09-26: qty 2

## 2016-09-26 MED ORDER — SODIUM CHLORIDE 0.9 % IV BOLUS (SEPSIS)
1000.0000 mL | Freq: Once | INTRAVENOUS | Status: AC
  Administered 2016-09-26: 1000 mL via INTRAVENOUS

## 2016-09-26 NOTE — ED Triage Notes (Signed)
Pt presents with recurring abd pain; pt suspects pancreatitis, denies ETOH abuse; pt states he was here last night for same

## 2016-09-26 NOTE — ED Provider Notes (Signed)
[facility] DEPT Provider Note   CSN: [id number] Arrival date & time: 09/26/16  0018  By signing my name below, I, Oleh Genin, attest that this documentation has been prepared under the direction and in the presence of Ripley Fraise, MD. Electronically Signed: Oleh Genin, Scribe. 09/26/16. 12:38 AM.   History   Chief Complaint Chief Complaint  Patient presents with  . Pancreatitis  . Abdominal Pain    HPI [name] is a 46 y.o. male with history of HTN, alcoholic pancreatitis, and polysubstance abuse who presents to the ED with abdominal pain. This patient was initially seen two days ago at [facility] with complaints of epigastric pain, vomiting, and diarrhea. Given IV fluids and discharged on Carafate and Protonix. He states that he has continued to experience symptoms at home despite taking those medications. Epigastric pain is constant. Also reporting mild L flank pain. No chest pain or dyspnea currently.  The history is provided by the patient. No language interpreter was used.  Abdominal Pain   This is a chronic problem. The problem occurs constantly. The problem has not changed since onset.The pain is associated with an unknown factor. The pain is located in the epigastric region. The pain is severe. Associated symptoms include diarrhea, nausea and vomiting.    Past Medical History:  Diagnosis Date  . Alcoholism /alcohol abuse ([place])   . Anemia   . Anxiety   . Arthritis    "knees; arms; elbows" (03/26/2015)  . Asthma   . Bipolar disorder ([place])   . Chronic bronchitis ([place])   . Chronic lower back pain   . Cocaine abuse   . Depression   . Family history of adverse reaction to anesthesia    "grandmother gets confused"  . Femoral condyle fracture ([place]) 03/08/2014   left medial/notes 03/09/2014  . GERD (gastroesophageal reflux disease)   . H/O hiatal hernia   . H/O suicide attempt 10/2012  . Heart murmur    "when he was little" (03/06/2013)    . High cholesterol   . History of blood transfusion 10/2012   "when I tried to commit suicide"  . History of stomach ulcers   . Hypertension   . Marijuana abuse, continuous   . Migraine    "a few times/year" (03/26/2015)  . Pancreatitis   . Pneumonia 1990's X 3  . PTSD (post-traumatic stress disorder)   . Shortness of breath    "can happen at anytime" (03/06/2013)  . Sickle cell trait ([place])   . WPW (Wolff-Parkinson-White syndrome)    [name] 03/06/2013    Patient Active Problem List   Diagnosis Date Noted  . Alcohol use disorder, severe, dependence ([place]) 07/25/2016  . Cocaine use disorder, severe, dependence ([place]) 07/25/2016  . Major depressive disorder, recurrent severe without psychotic features ([place]) 07/20/2016  . Leukocytosis   . Hospital acquired PNA 05/20/2015  . Pancreatitis 05/18/2015  . Pseudocyst of pancreas 05/18/2015  . Malnutrition of moderate degree 03/27/2015  . Abnormal transaminases 03/26/2015  . Polysubstance abuse (tobacco, cocaine, THC, and ETOH) 03/26/2015  . Acute pancreatitis 03/26/2015  . Alcohol-induced chronic pancreatitis ([place])   . Essential hypertension 02/06/2014  . Alcohol-induced acute pancreatitis 11/28/2013  . Pancreatic pseudocyst/cyst 11/25/2013  . Alcohol dependence ([place]) 10/23/2013  . MDD (major depressive disorder), recurrent severe, without psychosis ([place]) 10/22/2013  . Protein-calorie malnutrition, severe ([place]) 10/10/2013  . Suicide attempt ([place]) 10/08/2013  . 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 TOBACCO ABUSE 03/23/2007    Past  Surgical History:  Procedure Laterality Date  . CARDIAC CATHETERIZATION    . EYE SURGERY Left 1990's   "result of trauma"   . FACIAL FRACTURE SURGERY Left 1990's   "result of trauma"   . FRACTURE SURGERY    . HERNIA REPAIR    . LEFT HEART CATHETERIZATION WITH CORONARY ANGIOGRAM Right 03/07/2013   Procedure: LEFT HEART CATHETERIZATION WITH CORONARY ANGIOGRAM;  Surgeon:  [name], MD;  Location: [facility] CATH LAB;  Service: Cardiovascular;  Laterality: Right;  . UMBILICAL HERNIA REPAIR         Home Medications    Prior to Admission medications   Medication Sig Start Date End Date Taking? Authorizing Provider  albuterol (PROVENTIL HFA;VENTOLIN HFA) 108 (90 Base) MCG/ACT inhaler Inhale 2 puffs into the lungs every 6 (six) hours as needed for wheezing or shortness of breath. 07/25/16   Lindell Spar I, NP  benzocaine (ORAJEL) 10 % mucosal gel Use as directed in the mouth or throat 4 (four) times daily as needed for mouth pain. Patient not taking: Reported on 08/25/2016 07/25/16   Lindell Spar I, NP  cholestyramine (QUESTRAN) 4 g packet Take 1 packet (4 g total) by mouth 3 (three) times daily as needed (diarrhea). 07/25/16   Lindell Spar I, NP  famotidine (PEPCID) 20 MG tablet Take 1 tablet (20 mg total) by mouth 2 (two) times daily. 08/10/16   Charlesetta Shanks, MD  fluticasone (FLONASE) 50 MCG/ACT nasal spray Place 1 spray into both nostrils daily as needed for rhinitis. 07/25/16   Lindell Spar I, NP  gabapentin (NEURONTIN) 300 MG capsule Take 1 capsule (300 mg total) by mouth 3 (three) times daily. For agitation 07/25/16   Lindell Spar I, NP  haloperidol (HALDOL) 5 MG tablet Take 1 tablet (5 mg total) by mouth 2 (two) times daily. For mood control 07/25/16   Nwoko, Herbert Pun I, NP  hydrocerin (EUCERIN) CREA Apply 1 application topically 2 (two) times daily. For dry skin 07/25/16   Lindell Spar I, NP  HYDROcodone-acetaminophen (NORCO/VICODIN) 5-325 MG tablet Take 1 tablet by mouth every 6 (six) hours as needed. 08/26/16   Junius Creamer, NP  hydrOXYzine (ATARAX/VISTARIL) 25 MG tablet Take 1 tablet (25 mg total) by mouth every 6 (six) hours as needed for anxiety. 07/25/16   Lindell Spar I, NP  lipase/protease/amylase (CREON) 12000 units CPEP capsule Take 2 capsules (24,000 Units total) by mouth 3 (three) times daily with meals. For pancreatitis 07/25/16   Lindell Spar I, NP    loratadine (CLARITIN) 10 MG tablet Take 1 tablet (10 mg total) by mouth daily. (May purchase from over the counter): For allergies 07/26/16   Lindell Spar I, NP  metoprolol tartrate (LOPRESSOR) 25 MG tablet Take 1 tablet (25 mg total) by mouth 2 (two) times daily. For high blood pressure 07/25/16   Nwoko, Herbert Pun I, NP  naltrexone (DEPADE) 50 MG tablet Take 1 tablet (50 mg total) by mouth daily. For alcoholism 07/26/16   Lindell Spar I, NP  nicotine polacrilex (NICORETTE) 2 MG gum Take 1 each (2 mg total) by mouth as needed for smoking cessation. 07/25/16   Lindell Spar I, NP  ondansetron (ZOFRAN) 4 MG tablet Take 1 tablet (4 mg total) by mouth every 8 (eight) hours as needed for nausea or vomiting. 09/24/16   Julianne Rice, MD  oxyCODONE-acetaminophen (PERCOCET/ROXICET) 5-325 MG tablet TK 1 OR 2 TS PO Q  6 H PRF SEVERE PAIN 06/12/16   [provider]  pantoprazole (PROTONIX) 40 MG  tablet Take 1 tablet (40 mg total) by mouth daily. 09/24/16   Julianne Rice, MD  promethazine (PHENERGAN) 25 MG tablet Take 1 tablet (25 mg total) by mouth every 6 (six) hours as needed for nausea or vomiting. 08/25/16   Kirichenko, Tatyana, PA-C  sertraline (ZOLOFT) 25 MG tablet Take 3 tablets (75 mg total) by mouth daily. For depression 07/26/16   Lindell Spar I, NP  sucralfate (CARAFATE) 1 g tablet Take 1 tablet (1 g total) by mouth 4 (four) times daily -  with meals and at bedtime. 09/24/16   Julianne Rice, MD    Family History Family History  Problem Relation Age of Onset  . Hypertension Other   . Coronary artery disease Other     Social History Social History  Substance Use Topics  . Smoking status: Current Every Day Smoker    Packs/day: 1.50    Years: 30.00    Types: Cigarettes  . Smokeless tobacco: Current User    Types: Chew  . Alcohol use 7.2 oz/week    12 Cans of beer per week     Comment: last drink 08/23/16     Allergies   Shellfish-derived products; Trazodone and nefazodone; Robaxin  [methocarbamol]; and Reglan [metoclopramide]   Review of Systems Review of Systems  Respiratory: Negative for shortness of breath.   Cardiovascular: Negative for chest pain.  Gastrointestinal: Positive for abdominal pain, diarrhea, nausea and vomiting.  All other systems reviewed and are negative.    Physical Exam Updated Vital Signs BP (!) 142/99 (BP Location: Left Arm)   Pulse 92   Temp 98.3 F (36.8 C) (Oral)   Resp 18   Ht 5\' 8"  (1.727 m)   Wt 150 lb (68 kg)   SpO2 100%   BMI 22.81 kg/m   Physical Exam CONSTITUTIONAL: chronically-ill appearing, no distress noted HEAD: Normocephalic/atraumatic EYES: EOMI/PERRL ENMT: Mucous membranes moist NECK: supple no meningeal signs SPINE/BACK:entire spine nontender CV: S1/S2 noted, no murmurs/rubs/gallops noted LUNGS: Lungs are clear to auscultation bilaterally, no apparent distress ABDOMEN: soft, nontender, no rebound or guarding, bowel sounds noted throughout abdomen GU:no cva tenderness NEURO: Pt is awake/alert/appropriate, moves all extremitiesx4.  No facial droop.   EXTREMITIES: pulses normal/equal, full ROM SKIN: warm, color normal PSYCH: no abnormalities of mood noted, alert and oriented to situation  ED Treatments / Results  Labs (all labs ordered are listed, but only abnormal results are displayed) Labs Reviewed - No data to display  EKG  EKG Interpretation None       Radiology No results found.  Procedures Procedures    Medications Ordered in ED Medications  promethazine (PHENERGAN) suppository 50 mg (not administered)     Initial Impression / Assessment and Plan / ED Course  I have reviewed the triage vital signs and the nursing notes.   Pt with 22 ER visits in 6 months He has care plan in place He has h/o chronic abdominal pain Currently, he is awake/alert, no distress, abd soft, no tenderness No active vomiting He is already able to urinate, so suspicion for severe dehydration is low He  has had multiple ER workups previously Pt informed of care plan due to multiple ED visits for chronic complaints Advised his pain needs are best managed as outpatient I did offer promethazine suppository to assist with nausea but patient declined   Final Clinical Impressions(s) / ED Diagnoses   Final diagnoses:  Chronic abdominal pain    New Prescriptions New Prescriptions   No medications on file  I personally performed the services described in this documentation, which was scribed in my presence. The recorded information has been reviewed and is accurate.       Ripley Fraise, MD 09/26/16 [phone]

## 2016-09-26 NOTE — ED Provider Notes (Signed)
[facility] DEPT Provider Note   CSN: [id number] Arrival date & time: 09/26/16  0201     History   Chief Complaint Chief Complaint  Patient presents with  . Abdominal Pain    pancreatitis    HPI [name] is a 46 y.o. male.  HPI  This is a 46 year old male well-known to our emergency department who presents with abdominal pain. History of chronic alcoholic pancreatitis. He was actually seen and evaluated at [facility] hospital this morning just prior to arrival and was discharged home. He has a care plan. Patient states that he has a 2 day history of increasing sharp epigastric pain consistent with his pancreatitis. He reports nausea and vomiting. He states he is unable to keep anything down. He reports compliance with his Carafate, Protonix. He states that these are not helping. He rates his pain at 10 out of 10. He states "they didn't do anything for me at [facility]." Reports that he last drank 2-3 days ago.  Past Medical History:  Diagnosis Date  . Alcoholism /alcohol abuse ([place])   . Anemia   . Anxiety   . Arthritis    "knees; arms; elbows" (03/26/2015)  . Asthma   . Bipolar disorder ([place])   . Chronic bronchitis ([place])   . Chronic lower back pain   . Cocaine abuse   . Depression   . Family history of adverse reaction to anesthesia    "grandmother gets confused"  . Femoral condyle fracture ([place]) 03/08/2014   left medial/notes 03/09/2014  . GERD (gastroesophageal reflux disease)   . H/O hiatal hernia   . H/O suicide attempt 10/2012  . Heart murmur    "when he was little" (03/06/2013)  . High cholesterol   . History of blood transfusion 10/2012   "when I tried to commit suicide"  . History of stomach ulcers   . Hypertension   . Marijuana abuse, continuous   . Migraine    "a few times/year" (03/26/2015)  . Pancreatitis   . Pneumonia 1990's X 3  . PTSD (post-traumatic stress disorder)   . Shortness of breath    "can happen at anytime" (03/06/2013)   . Sickle cell trait ([place])   . WPW (Wolff-Parkinson-White syndrome)    [name] 03/06/2013    Patient Active Problem List   Diagnosis Date Noted  . Alcohol use disorder, severe, dependence ([place]) 07/25/2016  . Cocaine use disorder, severe, dependence ([place]) 07/25/2016  . Major depressive disorder, recurrent severe without psychotic features ([place]) 07/20/2016  . Leukocytosis   . Hospital acquired PNA 05/20/2015  . Pancreatitis 05/18/2015  . Pseudocyst of pancreas 05/18/2015  . Malnutrition of moderate degree 03/27/2015  . Abnormal transaminases 03/26/2015  . Polysubstance abuse (tobacco, cocaine, THC, and ETOH) 03/26/2015  . Acute pancreatitis 03/26/2015  . Alcohol-induced chronic pancreatitis ([place])   . Essential hypertension 02/06/2014  . Alcohol-induced acute pancreatitis 11/28/2013  . Pancreatic pseudocyst/cyst 11/25/2013  . Alcohol dependence ([place]) 10/23/2013  . MDD (major depressive disorder), recurrent severe, without psychosis ([place]) 10/22/2013  . Protein-calorie malnutrition, severe ([place]) 10/10/2013  . Suicide attempt ([place]) 10/08/2013  . 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 TOBACCO ABUSE 03/23/2007    Past Surgical History:  Procedure Laterality Date  . CARDIAC CATHETERIZATION    . EYE SURGERY Left 1990's   "result of trauma"   . FACIAL FRACTURE SURGERY Left 1990's   "result of trauma"   . FRACTURE SURGERY    . HERNIA REPAIR    .  LEFT HEART CATHETERIZATION WITH CORONARY ANGIOGRAM Right 03/07/2013   Procedure: LEFT HEART CATHETERIZATION WITH CORONARY ANGIOGRAM;  Surgeon: [name], MD;  Location: [facility] CATH LAB;  Service: Cardiovascular;  Laterality: Right;  . UMBILICAL HERNIA REPAIR         Home Medications    Prior to Admission medications   Medication Sig Start Date End Date Taking? Authorizing Provider  albuterol (PROVENTIL HFA;VENTOLIN HFA) 108 (90 Base) MCG/ACT inhaler Inhale 2 puffs into the lungs every 6 (six) hours as  needed for wheezing or shortness of breath. 07/25/16  Yes Lindell Spar I, NP  cholestyramine (QUESTRAN) 4 g packet Take 1 packet (4 g total) by mouth 3 (three) times daily as needed (diarrhea). 07/25/16  Yes Lindell Spar I, NP  famotidine (PEPCID) 20 MG tablet Take 1 tablet (20 mg total) by mouth 2 (two) times daily. 08/10/16  Yes Charlesetta Shanks, MD  fluticasone (FLONASE) 50 MCG/ACT nasal spray Place 1 spray into both nostrils daily as needed for rhinitis. 07/25/16  Yes Lindell Spar I, NP  gabapentin (NEURONTIN) 300 MG capsule Take 1 capsule (300 mg total) by mouth 3 (three) times daily. For agitation 07/25/16  Yes Lindell Spar I, NP  haloperidol (HALDOL) 5 MG tablet Take 1 tablet (5 mg total) by mouth 2 (two) times daily. For mood control 07/25/16  Yes Nwoko, Herbert Pun I, NP  hydrocerin (EUCERIN) CREA Apply 1 application topically 2 (two) times daily. For dry skin 07/25/16  Yes Lindell Spar I, NP  HYDROcodone-acetaminophen (NORCO/VICODIN) 5-325 MG tablet Take 1 tablet by mouth every 6 (six) hours as needed. 08/26/16  Yes Junius Creamer, NP  hydrOXYzine (ATARAX/VISTARIL) 25 MG tablet Take 1 tablet (25 mg total) by mouth every 6 (six) hours as needed for anxiety. 07/25/16  Yes Lindell Spar I, NP  lipase/protease/amylase (CREON) 12000 units CPEP capsule Take 2 capsules (24,000 Units total) by mouth 3 (three) times daily with meals. For pancreatitis 07/25/16  Yes Lindell Spar I, NP  loratadine (CLARITIN) 10 MG tablet Take 1 tablet (10 mg total) by mouth daily. (May purchase from over the counter): For allergies 07/26/16  Yes Lindell Spar I, NP  metoprolol tartrate (LOPRESSOR) 25 MG tablet Take 1 tablet (25 mg total) by mouth 2 (two) times daily. For high blood pressure 07/25/16  Yes Nwoko, Agnes I, NP  naltrexone (DEPADE) 50 MG tablet Take 1 tablet (50 mg total) by mouth daily. For alcoholism 07/26/16  Yes Lindell Spar I, NP  nicotine polacrilex (NICORETTE) 2 MG gum Take 1 each (2 mg total) by mouth as needed for smoking  cessation. 07/25/16  Yes Lindell Spar I, NP  ondansetron (ZOFRAN) 4 MG tablet Take 1 tablet (4 mg total) by mouth every 8 (eight) hours as needed for nausea or vomiting. 09/24/16  Yes Julianne Rice, MD  pantoprazole (PROTONIX) 40 MG tablet Take 1 tablet (40 mg total) by mouth daily. 09/24/16  Yes Julianne Rice, MD  promethazine (PHENERGAN) 25 MG tablet Take 1 tablet (25 mg total) by mouth every 6 (six) hours as needed for nausea or vomiting. 08/25/16  Yes Kirichenko, Tatyana, PA-C  sertraline (ZOLOFT) 25 MG tablet Take 3 tablets (75 mg total) by mouth daily. For depression 07/26/16  Yes Nwoko, Herbert Pun I, NP  sucralfate (CARAFATE) 1 g tablet Take 1 tablet (1 g total) by mouth 4 (four) times daily -  with meals and at bedtime. 09/24/16  Yes Julianne Rice, MD  benzocaine (ORAJEL) 10 % mucosal gel Use as directed in the mouth  or throat 4 (four) times daily as needed for mouth pain. Patient not taking: Reported on 08/25/2016 07/25/16   Lindell Spar I, NP  ondansetron (ZOFRAN ODT) 4 MG disintegrating tablet Take 1 tablet (4 mg total) by mouth every 8 (eight) hours as needed for nausea or vomiting. 09/26/16   Horton, Barbette Hair, MD  sucralfate (CARAFATE) 1 g tablet Take 1 tablet (1 g total) by mouth 4 (four) times daily -  with meals and at bedtime. 09/26/16   Horton, Barbette Hair, MD    Family History Family History  Problem Relation Age of Onset  . Hypertension Other   . Coronary artery disease Other     Social History Social History  Substance Use Topics  . Smoking status: Current Every Day Smoker    Packs/day: 1.50    Years: 30.00    Types: Cigarettes  . Smokeless tobacco: Current User    Types: Chew  . Alcohol use 7.2 oz/week    12 Cans of beer per week     Comment: last drink 08/23/16     Allergies   Shellfish-derived products; Trazodone and nefazodone; Robaxin [methocarbamol]; and Reglan [metoclopramide]   Review of Systems Review of Systems  Constitutional: Negative for fever.    Respiratory: Negative for shortness of breath.   Cardiovascular: Negative for chest pain.  Gastrointestinal: Positive for abdominal pain, nausea and vomiting. Negative for diarrhea.  All other systems reviewed and are negative.    Physical Exam Updated Vital Signs BP (!) 164/120   Pulse 91   Temp 98.6 F (37 C)   Resp (!) 31   SpO2 100%   Physical Exam  Constitutional: He is oriented to person, place, and time. He appears well-developed and well-nourished. No distress.  Thin  HENT:  Head: Normocephalic and atraumatic.  Mucous membranes moist  Cardiovascular: Normal rate, regular rhythm and normal heart sounds.   No murmur heard. Pulmonary/Chest: Effort normal and breath sounds normal. No respiratory distress. He has no wheezes.  Abdominal: Soft. Bowel sounds are normal. He exhibits no distension. There is tenderness. There is no rebound and no guarding.  Minimal epigastric tenderness palpation, abdomen soft  Musculoskeletal: He exhibits no edema.  Lymphadenopathy:    He has no cervical adenopathy.  Neurological: He is alert and oriented to person, place, and time.  Skin: Skin is warm and dry.  Psychiatric: He has a normal mood and affect.  Nursing note and vitals reviewed.    ED Treatments / Results  Labs (all labs ordered are listed, but only abnormal results are displayed) Labs Reviewed  LIPASE, BLOOD - Abnormal; Notable for the following:       Result Value   Lipase 309 (*)    All other components within normal limits  COMPREHENSIVE METABOLIC PANEL - Abnormal; Notable for the following:    Potassium 3.2 (*)    CO2 21 (*)    Glucose, Bld 107 (*)    BUN <5 (*)    Creatinine, Ser 0.60 (*)    All other components within normal limits  CBC - Abnormal; Notable for the following:    Hemoglobin 12.1 (*)    HCT 37.4 (*)    RDW 18.2 (*)    All other components within normal limits  URINALYSIS, ROUTINE W REFLEX MICROSCOPIC    EKG  EKG  Interpretation  Date/Time:  Monday Sep 26 2016 04:23:20 EDT Ventricular Rate:  93 PR Interval:    QRS Duration: 98 QT Interval:  345 QTC Calculation:  430 R Axis:   77 Text Interpretation:  Sinus rhythm Short PR interval Biatrial enlargement Nonspecific T abnrm, anterolateral leads No significant change since last tracing Confirmed by Thayer Jew [phone]) on 09/26/2016 5:17:32 AM       Radiology No results found.  Procedures Procedures (including critical care time)  Medications Ordered in ED Medications  sodium chloride 0.9 % bolus 1,000 mL (0 mLs Intravenous Stopped 09/26/16 0608)  ondansetron (ZOFRAN) injection 4 mg (4 mg Intravenous Given 09/26/16 0435)  gi cocktail (Maalox,Lidocaine,Donnatal) (30 mLs Oral Given 09/26/16 0427)  haloperidol lactate (HALDOL) injection 2 mg (2 mg Intravenous Given 09/26/16 0437)     Initial Impression / Assessment and Plan / ED Course  I have reviewed the triage vital signs and the nursing notes.  Pertinent labs & imaging results that were available during my care of the patient were reviewed by me and considered in my medical decision making (see chart for details).     Patient with history of chronic alcoholic pancreatitis. Nontoxic-appearing. He feels well hydrated. No ketones in his urine. Lab work was sent from triage. He has a care plan. He is lipase is 309. I have reviewed his chart. Usually his life places are normal to the low 100s. He did have a lipase of 341 one year ago. He was discharged with supportive measures at home and did not require hospitalization. Given that he appears well-hydrated without objective signs of dehydration, patient was given fluids and antinausea medication. He was also given Haldol. He frequently requested pain medication. I do not feel that this is appropriate given his chronic abdominal pain syndrome. I discussed this with the patient. He is tolerating fluids. Given that his abdominal exam is fairly  reassuring without signs of peritonitis, feel he can be safely discharged home. Follow a clear liquid diet.  After history, exam, and medical workup I feel the patient has been appropriately medically screened and is safe for discharge home. Pertinent diagnoses were discussed with the patient. Patient was given return precautions.   Final Clinical Impressions(s) / ED Diagnoses   Final diagnoses:  Chronic alcoholic pancreatitis (HCC)    New Prescriptions New Prescriptions   ONDANSETRON (ZOFRAN ODT) 4 MG DISINTEGRATING TABLET    Take 1 tablet (4 mg total) by mouth every 8 (eight) hours as needed for nausea or vomiting.   SUCRALFATE (CARAFATE) 1 G TABLET    Take 1 tablet (1 g total) by mouth 4 (four) times daily -  with meals and at bedtime.     Merryl Hacker, MD 09/26/16 [phone]

## 2016-09-26 NOTE — ED Notes (Signed)
Horton, MD notified of patients request for pain medications. No orders at this time

## 2016-09-26 NOTE — ED Notes (Signed)
Pt departed in NAD, refused use of wheelchair.  

## 2016-09-26 NOTE — ED Notes (Signed)
Bed: VG86 Expected date:  Expected time:  Means of arrival:  Comments: EMS-Abd Pain

## 2016-09-26 NOTE — ED Notes (Signed)
Pt able to retain PO fluids. C/o continued pain with PO intake.

## 2016-09-26 NOTE — ED Notes (Signed)
ED Provider at bedside. 

## 2016-09-26 NOTE — Discharge Instructions (Signed)
You were seen today for abdominal pain. Your lipase is slightly elevated. This is likely related to your alcohol use.   You need to follow a clear liquid diet. You'll be given Zofran for vomiting.  It is very important that you avoid alcohol use as this will exacerbate your symptoms.

## 2016-09-26 NOTE — ED Triage Notes (Signed)
Pt states "his pancreatitis is acting up and he would like some pain medication."

## 2016-09-26 NOTE — Discharge Instructions (Signed)
Emergency care providers appreciate that many patients coming to us are in severe pain and we wish to address their pain in the safest, most responsible manner.  It is important to recognize however, that the proper treatment of chronic pain differs from that of the pain of injuries and acute illnesses.  Our goal is to provide quality, safe, personalized care and we thank you for giving us the opportunity to serve you. °The use of narcotics and related agents for chronic pain syndromes may lead to additional physical and psychological problems.  Nearly as many people die from prescription narcotics each year as die from car crashes.  Additionally, this risk is increased if such prescriptions are obtained from a variety of sources.  Therefore, only your primary care physician or a pain management specialist is able to safely treat such syndromes with narcotic medications long-term.   ° °Documentation revealing such prescriptions have been sought from multiple sources may prohibit us from providing a refill or different narcotic medication.  Your name may be checked first through the [place] Controlled Substances Reporting System.  This database is a record of controlled substance medication prescriptions that the patient has received.  This has been established by [facility] in an effort to eliminate the dangerous, and often life threatening, practice of obtaining multiple prescriptions from different medical providers.  ° °If you have a chronic pain syndrome (i.e. chronic headaches, recurrent back or neck pain, dental pain, abdominal or pelvis pain without a specific diagnosis, or neuropathic pain such as fibromyalgia) or recurrent visits for the same condition without an acute diagnosis, you may be treated with non-narcotics and other non-addictive medicines.  Allergic reactions or negative side effects that may be reported by a patient to such medications will not typically lead to the use of a narcotic  analgesic or other controlled substance as an alternative. °  °Patients managing chronic pain with a personal physician should have provisions in place for breakthrough pain.  If you are in crisis, you should call your physician.  If your physician directs you to the emergency department, please have the doctor call and speak to our attending physician concerning your care. °  °When patients come to the Emergency Department (ED) with acute medical conditions in which the Emergency Department physician feels appropriate to prescribe narcotic or sedating pain medication, the physician will prescribe these in very limited quantities.  The amount of these medications will last only until you can see your primary care physician in his/her office.  Any patient who returns to the ED seeking refills should expect only non-narcotic pain medications.  ° °  °Prescriptions for narcotic or sedating medications that have been lost, stolen or expired will not be refilled in the Emergency Department.   ° ° ° ° ° ° °

## 2016-11-14 ENCOUNTER — Ambulatory Visit: Payer: Self-pay | Admitting: Pediatrics

## 2016-11-14 IMAGING — CT CT ABD-PELV W/ CM
2 of 5 series · 15 of 46 positions shown, 17 images · IV contrast (Omni 300)
Comparison: 02/23/2014.

ADDENDUM:
A pelvis CT was also initially ordered but not performed. The
patient was brought back and additional images were obtained through
the pelvis. The patient reportedly has some left lower quadrant
abdominal pain. The images through the pelvis demonstrate
normal-appearing small bowel loops and colon. The appendix has a
normal appearance. Normal appearing urinary bladder and prostate
gland. The pelvic bones are unremarkable. No enlarged lymph nodes.
Degenerative changes are noted at the L5-S1 level with disc space
narrowing and a vacuum phenomenon. Also noted are atheromatous
arterial calcifications.
CLINICAL DATA: Worsening, constant upper abdominal pain. Elevated
lipase. History of pancreatitis. The patient reports a history of a
hiatal hernia.

EXAM:
CT ABDOMEN AND PELVIS WITH CONTRAST
TECHNIQUE: Multidetector CT imaging of the abdomen and pelvis was performed
using the standard protocol following bolus administration of
intravenous contrast.
CONTRAST:  100mL OMNIPAQUE IOHEXOL 300 MG/ML  SOLN

[Series 2: abd/ pelvis 5.0 i30f 1 · axial · 0.71mm/px · z∈[-512,-317]mm · 12 of 47 slices shown, 14 images]
[im 4/47  soft-tissue]
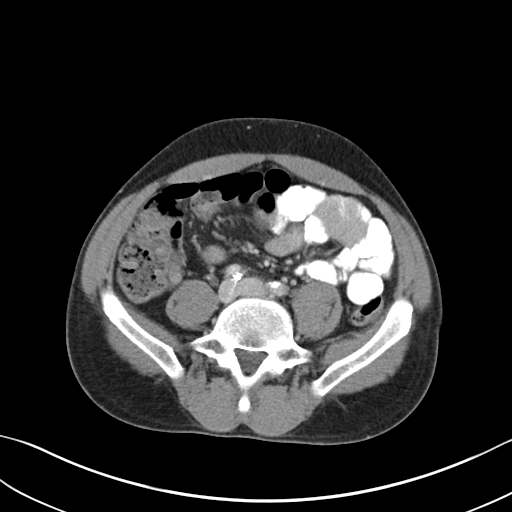
[im 4/47  bone]
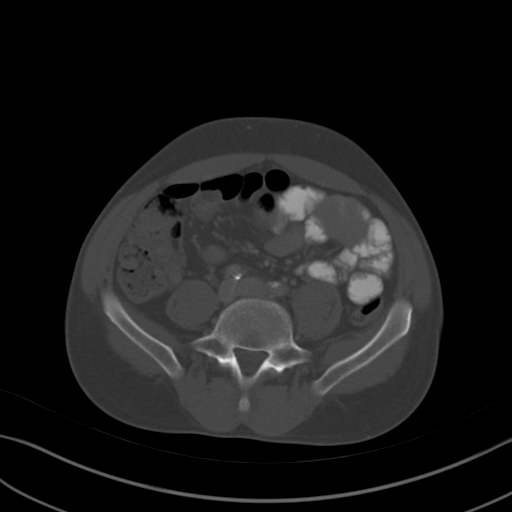
[im 7/47  soft-tissue]
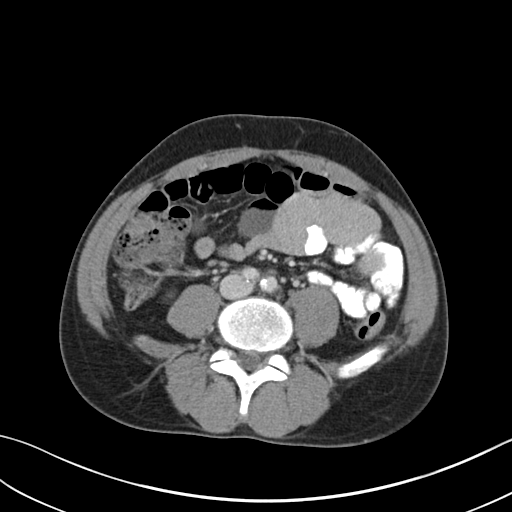
[im 10/47  soft-tissue]
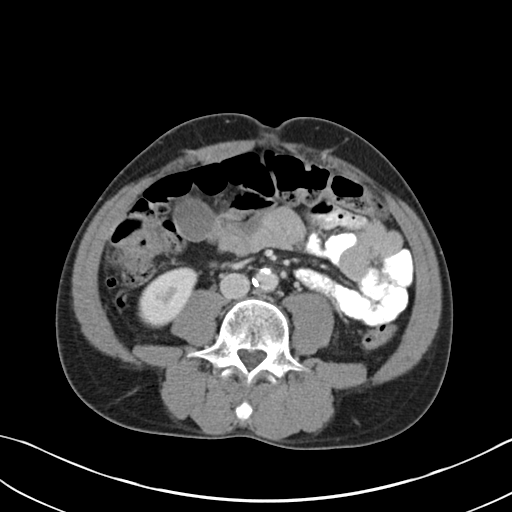
[im 14/47  soft-tissue]
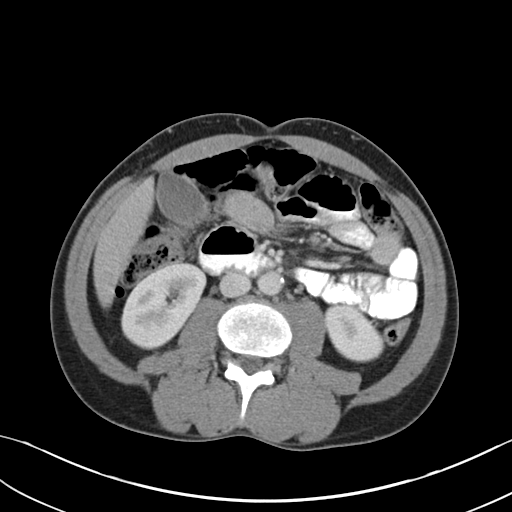
[im 17/47  soft-tissue]
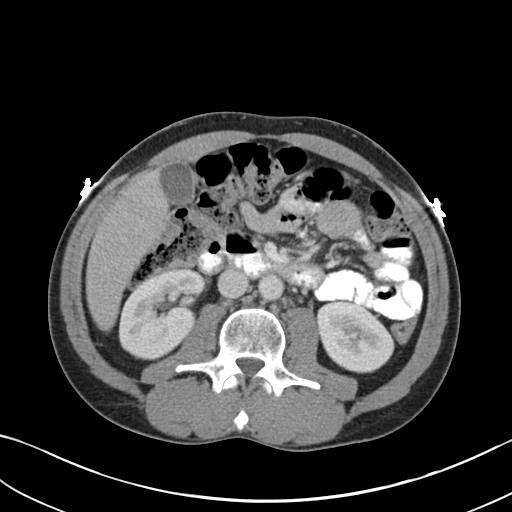
[im 20/47  soft-tissue]
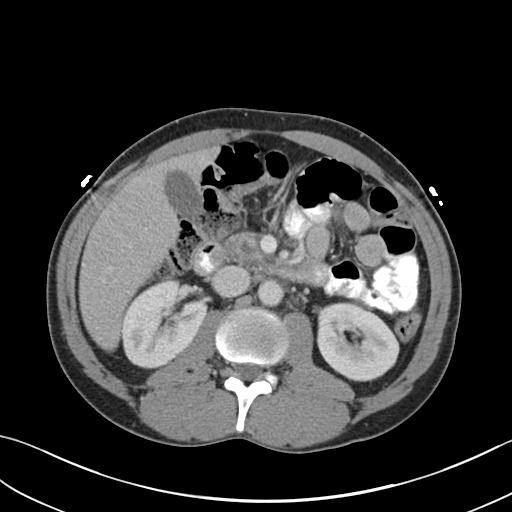
[im 27/47  soft-tissue]
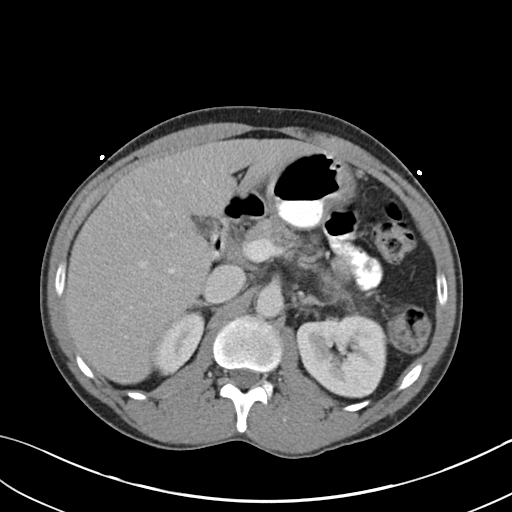
[im 30/47  soft-tissue]
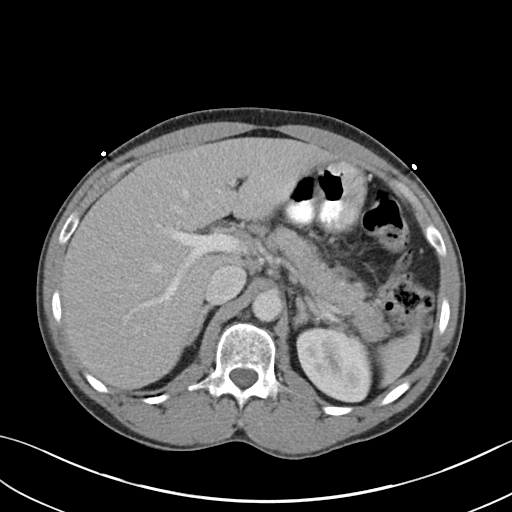
[im 33/47  soft-tissue]
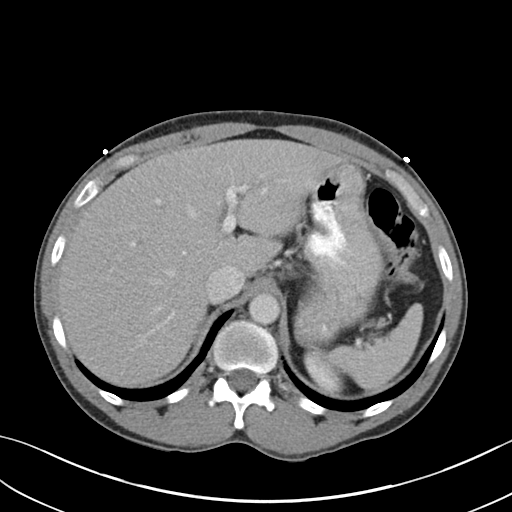
[im 33/47  bone]
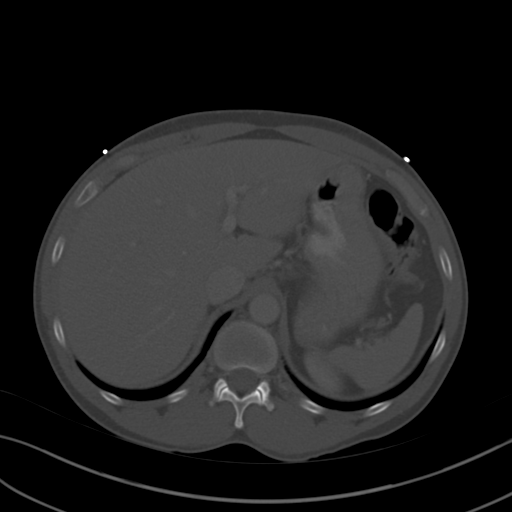
[im 37/47  soft-tissue]
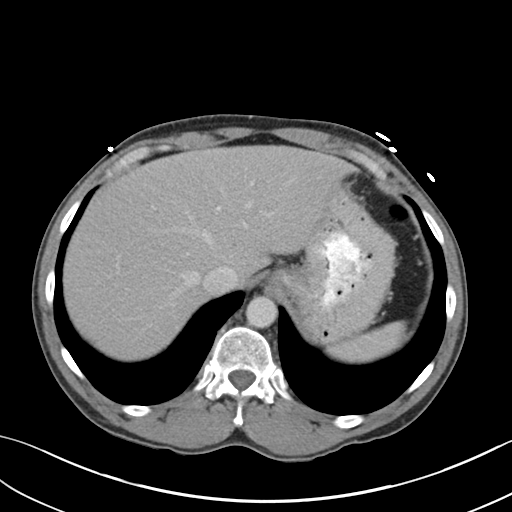
[im 40/47  soft-tissue]
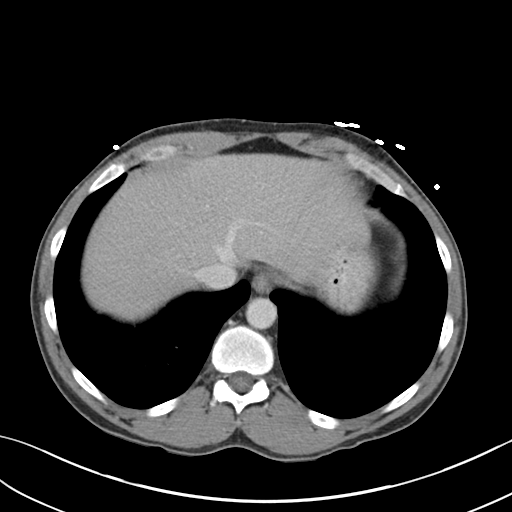
[im 43/47  soft-tissue]
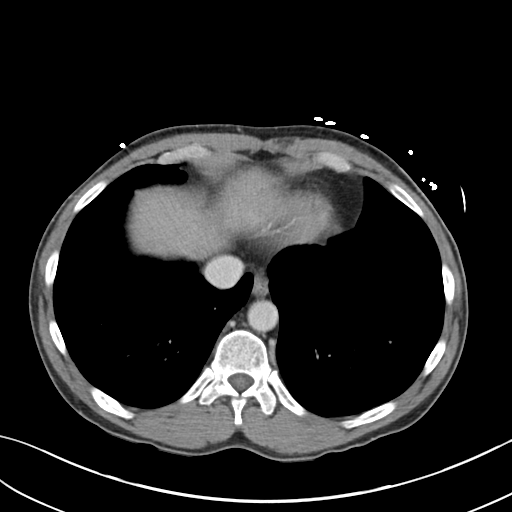

[Series 5: coronals · coronal · 0.47mm/px · 3 of 126 slices shown]
[im 42/126  soft-tissue]
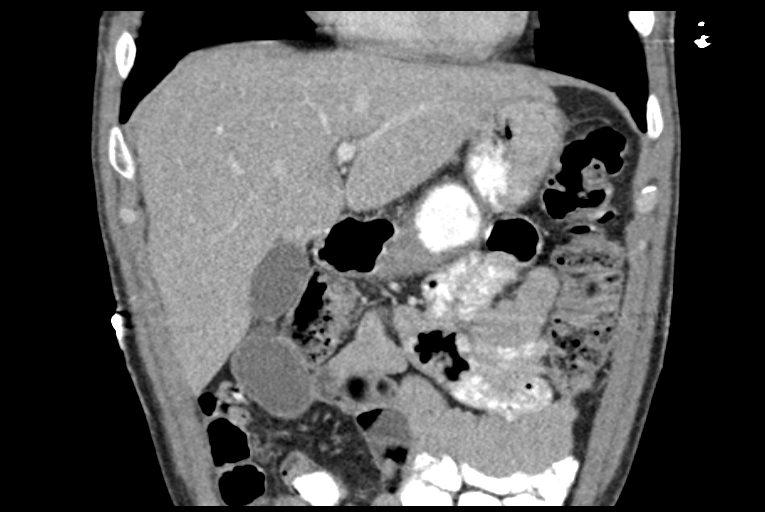
[im 56/126  soft-tissue]
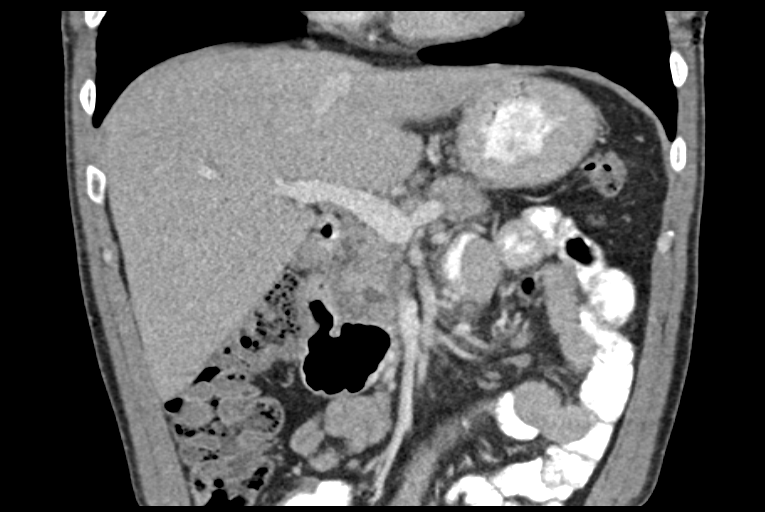
[im 70/126  soft-tissue]
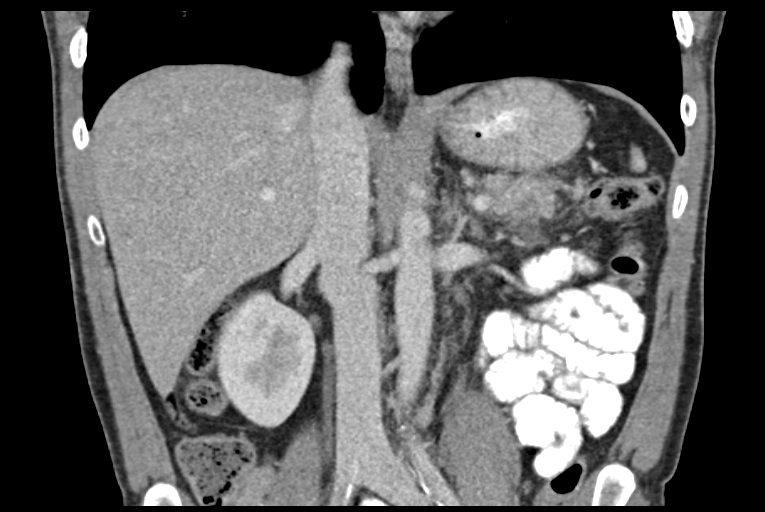

[15 of 46 positions shown; findings below may reference images not displayed]

IMPRESSION: No acute pelvic abnormality.
Chest and abdomen radiographs dated
02/28/2014. Abdomen ultrasound dated 11/18/2013.
FINDINGS: Minimal diffuse low density of the liver relative to the spleen.
Minimally ill-defined margins of the pancreatic tail with minimal
peripancreatic edema. The previously demonstrated 2.8 x 1.8 cm fluid
collection in the head of the pancreas is significantly smaller,
currently measuring 1.1 x 0.6 cm on image number 28 of series 2.
There is also a 0.8 x 0.7 cm oval cystic area in the tail of the
pancreas on image number 19, previously measuring 0.7 x 0.5 cm. The
remainder of the multiple small cystic lesions previously seen in
the pancreas are no longer demonstrated.

Unremarkable spleen, gallbladder, adrenal glands and left kidney. A
tiny right renal cyst is unchanged. No gastrointestinal
abnormalities or enlarged lymph nodes. No hiatal hernia seen.
Interval small amount of linear atelectasis or scarring in the right
middle lobe. Unremarkable bones.
IMPRESSION: 1. Minimal changes of acute pancreatitis involving the tail of the
pancreas.
2. Interval decrease in size of a pseudocyst in the head of the
pancreas, currently measuring 1.1 cm in maximum diameter.
3. Minimal increase in size of a small cystic lesion in the tail of
the pancreas, currently measuring 0.8 x 0.7 cm. This could represent
a small pseudocyst or small cystic neoplasm.
4. Resolution of the remainder of the previously demonstrated small
pseudocysts in the pancreas.
5. Minimal diffuse hepatic steatosis.

## 2016-11-16 IMAGING — DX DG CHEST 2V
2 series · 2 of 2 positions shown · non-contrast
Comparison: Chest radiograph February 28, 2014

CLINICAL DATA: LEFT chest pain, shortness of breath and irregular
heart beat.

EXAM:
CHEST  2 VIEW

[chest pa]
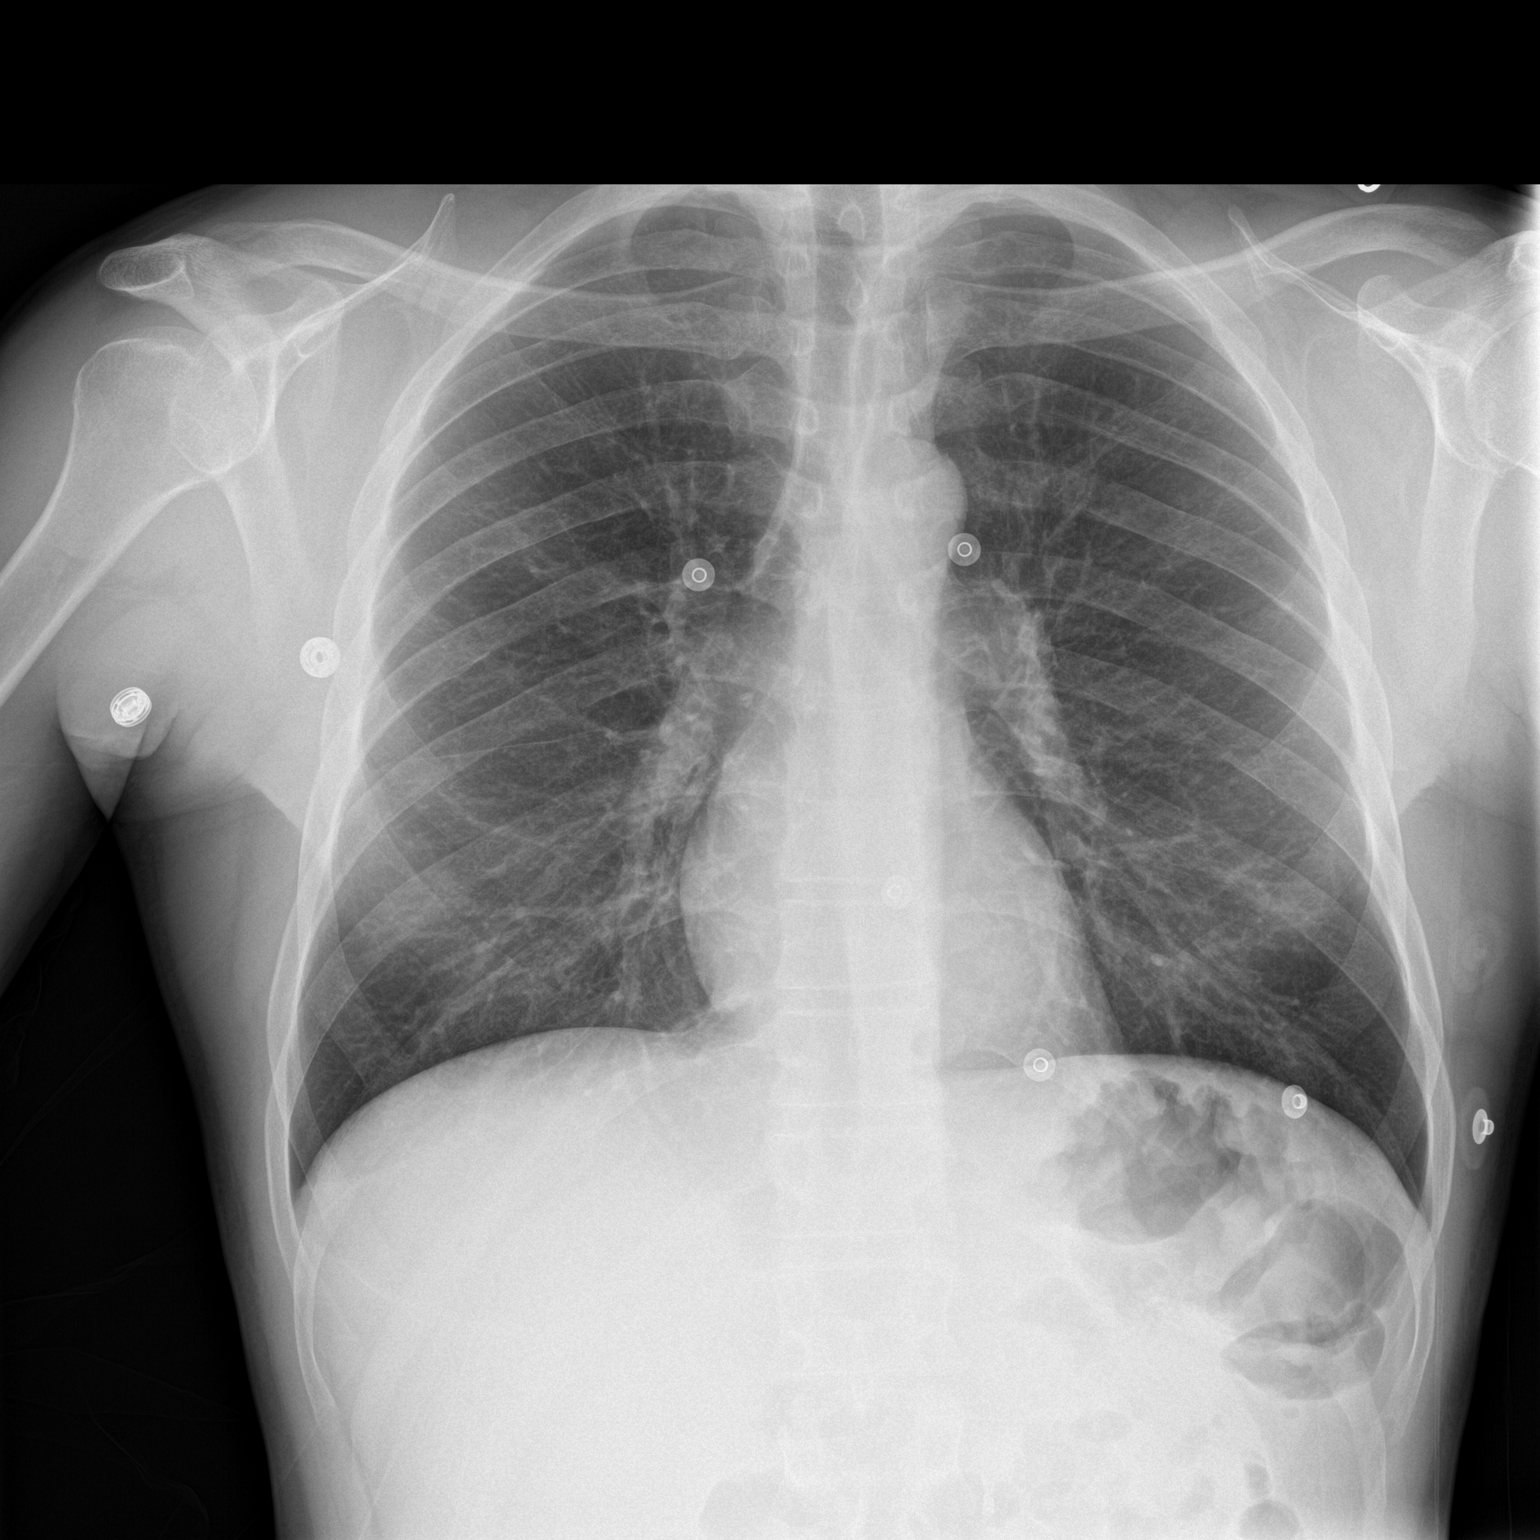

[chest lat]
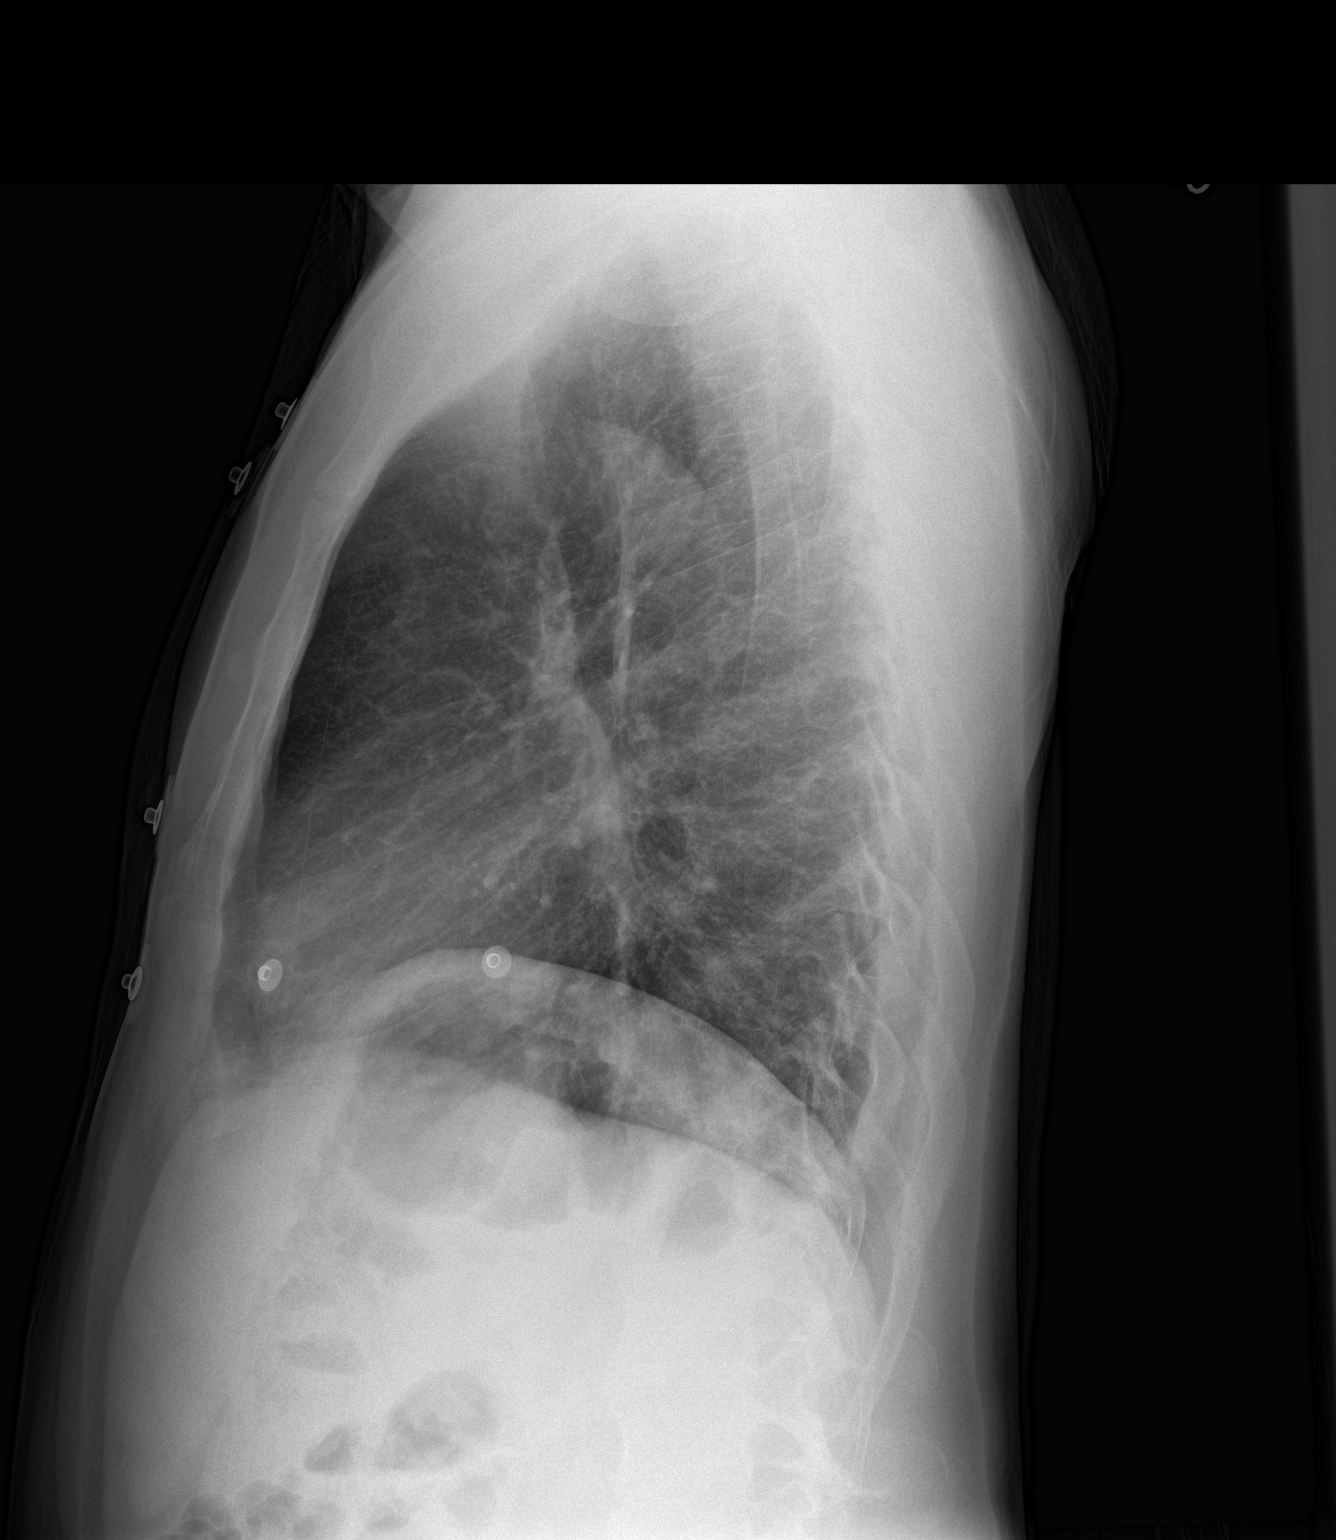

[2 of 2 positions shown; findings below may reference images not displayed]

FINDINGS: Cardiomediastinal silhouette is unremarkable. The lungs are clear
without pleural effusions or focal consolidations. Trachea projects
midline and there is no pneumothorax. Soft tissue planes and
included osseous structures are non-suspicious.
IMPRESSION: Normal chest.

  By: Konstruktoriai Derencius

## 2016-11-21 ENCOUNTER — Ambulatory Visit: Payer: Self-pay | Admitting: Allergy

## 2016-11-22 ENCOUNTER — Encounter (HOSPITAL_COMMUNITY): Payer: Self-pay

## 2016-11-22 ENCOUNTER — Emergency Department (HOSPITAL_COMMUNITY): Payer: Self-pay

## 2016-11-22 ENCOUNTER — Encounter (HOSPITAL_COMMUNITY): Payer: Self-pay | Admitting: Emergency Medicine

## 2016-11-22 ENCOUNTER — Other Ambulatory Visit: Payer: Self-pay

## 2016-11-22 ENCOUNTER — Emergency Department (HOSPITAL_COMMUNITY)
Admission: EM | Admit: 2016-11-22 | Discharge: 2016-11-22 | Disposition: A | Discharge status: Home / Self Care | Payer: Self-pay | Attending: Emergency Medicine | Admitting: Emergency Medicine

## 2016-11-22 DIAGNOSIS — I10 Essential (primary) hypertension: Secondary | ICD-10-CM | POA: Insufficient documentation

## 2016-11-22 DIAGNOSIS — F1010 Alcohol abuse, uncomplicated: Secondary | ICD-10-CM | POA: Insufficient documentation

## 2016-11-22 DIAGNOSIS — J45909 Unspecified asthma, uncomplicated: Secondary | ICD-10-CM | POA: Insufficient documentation

## 2016-11-22 DIAGNOSIS — D573 Sickle-cell trait: Secondary | ICD-10-CM | POA: Insufficient documentation

## 2016-11-22 DIAGNOSIS — F1410 Cocaine abuse, uncomplicated: Secondary | ICD-10-CM | POA: Insufficient documentation

## 2016-11-22 DIAGNOSIS — F329 Major depressive disorder, single episode, unspecified: Secondary | ICD-10-CM | POA: Insufficient documentation

## 2016-11-22 DIAGNOSIS — F17210 Nicotine dependence, cigarettes, uncomplicated: Secondary | ICD-10-CM | POA: Insufficient documentation

## 2016-11-22 DIAGNOSIS — R197 Diarrhea, unspecified: Secondary | ICD-10-CM | POA: Insufficient documentation

## 2016-11-22 DIAGNOSIS — R1013 Epigastric pain: Secondary | ICD-10-CM | POA: Insufficient documentation

## 2016-11-22 DIAGNOSIS — F419 Anxiety disorder, unspecified: Secondary | ICD-10-CM | POA: Insufficient documentation

## 2016-11-22 DIAGNOSIS — G8929 Other chronic pain: Secondary | ICD-10-CM | POA: Insufficient documentation

## 2016-11-22 DIAGNOSIS — K861 Other chronic pancreatitis: Secondary | ICD-10-CM | POA: Insufficient documentation

## 2016-11-22 DIAGNOSIS — R110 Nausea: Secondary | ICD-10-CM | POA: Insufficient documentation

## 2016-11-22 DIAGNOSIS — R1084 Generalized abdominal pain: Secondary | ICD-10-CM | POA: Insufficient documentation

## 2016-11-22 DIAGNOSIS — F1210 Cannabis abuse, uncomplicated: Secondary | ICD-10-CM | POA: Insufficient documentation

## 2016-11-22 DIAGNOSIS — Z79899 Other long term (current) drug therapy: Secondary | ICD-10-CM | POA: Insufficient documentation

## 2016-11-22 LAB — CBC
HCT: 34 % — ABNORMAL LOW (ref 39.0–52.0)
HEMATOCRIT: 33.1 % — AB (ref 39.0–52.0)
HEMOGLOBIN: 11.5 g/dL — AB (ref 13.0–17.0)
HEMOGLOBIN: 11 g/dL — AB (ref 13.0–17.0)
MCH: 29.6 pg (ref 26.0–34.0)
MCH: 28.9 pg (ref 26.0–34.0)
MCHC: 33.8 g/dL (ref 30.0–36.0)
MCHC: 33.2 g/dL (ref 30.0–36.0)
MCV: 87.6 fL (ref 78.0–100.0)
MCV: 87.1 fL (ref 78.0–100.0)
Platelets: 246 10*3/uL (ref 150–400)
Platelets: 239 10*3/uL (ref 150–400)
RBC: 3.88 MIL/uL — ABNORMAL LOW (ref 4.22–5.81)
RBC: 3.8 MIL/uL — AB (ref 4.22–5.81)
RDW: 18.4 % — AB (ref 11.5–15.5)
RDW: 18.3 % — ABNORMAL HIGH (ref 11.5–15.5)
WBC: 9.6 10*3/uL (ref 4.0–10.5)
WBC: 10.4 10*3/uL (ref 4.0–10.5)

## 2016-11-22 LAB — COMPREHENSIVE METABOLIC PANEL
ALT: 29 U/L (ref 17–63)
ANION GAP: 10 (ref 5–15)
AST: 32 U/L (ref 15–41)
Albumin: 4.2 g/dL (ref 3.5–5.0)
Alkaline Phosphatase: 95 U/L (ref 38–126)
BUN: <5 mg/dL — ABNORMAL LOW (ref 6–20)
CHLORIDE: 107 mmol/L (ref 101–111)
CO2: 16 mmol/L — AB (ref 22–32)
Calcium: 9.3 mg/dL (ref 8.9–10.3)
Creatinine, Ser: 0.63 mg/dL (ref 0.61–1.24)
GFR calc non Af Amer: >60 mL/min (ref >60)
Glucose, Bld: 102 mg/dL — ABNORMAL HIGH (ref 65–99)
POTASSIUM: 3.1 mmol/L — AB (ref 3.5–5.1)
SODIUM: 133 mmol/L — AB (ref 135–145)
Total Bilirubin: 0.7 mg/dL (ref 0.3–1.2)
Total Protein: 7.7 g/dL (ref 6.5–8.1)

## 2016-11-22 LAB — URINALYSIS, ROUTINE W REFLEX MICROSCOPIC
Bilirubin Urine: NEGATIVE
GLUCOSE, UA: NEGATIVE mg/dL
HGB URINE DIPSTICK: NEGATIVE
Ketones, ur: NEGATIVE mg/dL
LEUKOCYTES UA: NEGATIVE
Nitrite: NEGATIVE
Protein, ur: NEGATIVE mg/dL
SPECIFIC GRAVITY, URINE: 1.008 (ref 1.005–1.030)
pH: 5 (ref 5.0–8.0)

## 2016-11-22 LAB — HEPATIC FUNCTION PANEL
ALT: 35 U/L (ref 17–63)
AST: 47 U/L — ABNORMAL HIGH (ref 15–41)
Albumin: 4.4 g/dL (ref 3.5–5.0)
Alkaline Phosphatase: 96 U/L (ref 38–126)
BILIRUBIN DIRECT: 0.1 mg/dL (ref 0.1–0.5)
BILIRUBIN TOTAL: 0.6 mg/dL (ref 0.3–1.2)
Indirect Bilirubin: 0.5 mg/dL (ref 0.3–0.9)
Total Protein: 8.4 g/dL — ABNORMAL HIGH (ref 6.5–8.1)

## 2016-11-22 LAB — BASIC METABOLIC PANEL
Anion gap: 10 (ref 5–15)
BUN: 6 mg/dL (ref 6–20)
CALCIUM: 9.3 mg/dL (ref 8.9–10.3)
CO2: 19 mmol/L — ABNORMAL LOW (ref 22–32)
Chloride: 106 mmol/L (ref 101–111)
Creatinine, Ser: 0.67 mg/dL (ref 0.61–1.24)
GFR calc Af Amer: >60 mL/min (ref >60)
GLUCOSE: 109 mg/dL — AB (ref 65–99)
Potassium: 3.8 mmol/L (ref 3.5–5.1)
SODIUM: 135 mmol/L (ref 135–145)

## 2016-11-22 LAB — I-STAT TROPONIN, ED: TROPONIN I, POC: 0 ng/mL (ref 0.00–0.08)

## 2016-11-22 LAB — LIPASE, BLOOD
LIPASE: 133 U/L — AB (ref 11–51)
Lipase: 114 U/L — ABNORMAL HIGH (ref 11–51)

## 2016-11-22 LAB — TROPONIN I

## 2016-11-22 MED ORDER — ONDANSETRON HCL 4 MG/2ML IJ SOLN
4.0000 mg | Freq: Once | INTRAMUSCULAR | Status: AC
  Administered 2016-11-22: 4 mg via INTRAVENOUS
  Filled 2016-11-22: qty 2

## 2016-11-22 MED ORDER — GI COCKTAIL ~~LOC~~
30.0000 mL | Freq: Once | ORAL | Status: AC
  Administered 2016-11-22: 30 mL via ORAL
  Filled 2016-11-22: qty 30

## 2016-11-22 MED ORDER — RANITIDINE HCL 150 MG/10ML PO SYRP
150.0000 mg | ORAL_SOLUTION | Freq: Once | ORAL | Status: DC
  Filled 2016-11-22: qty 10

## 2016-11-22 NOTE — ED Notes (Signed)
Pt given water by mouth and tolerating it ok so far.

## 2016-11-22 NOTE — ED Triage Notes (Signed)
Pt from home with c/o diarrhea, generalized abdominal pain and 5/10 left sided chest pain that does not radiate anywhere. Pt had hx of pancreatitis and states his abdominal pain is similar to that. Pt denies emesis. Pt states he was walking outside (about a quarter of a mile) and he thinks that exacerbated his chest pain that started last night.

## 2016-11-22 NOTE — ED Notes (Signed)
Patient says that oral fluids make pain worse.

## 2016-11-22 NOTE — ED Notes (Signed)
Pt brought back to triage room c/o 7/10 sharp chest pain.

## 2016-11-22 NOTE — ED Provider Notes (Signed)
[facility] DEPT Provider Note   CSN: [id number] Arrival date & time: 11/22/16  1254     History   Chief Complaint Chief Complaint  Patient presents with  . Chest Pain  . Abdominal Pain  . Diarrhea    HPI [name] is a 46 y.o. male.  The history is provided by the patient.  Abdominal Pain   This is a recurrent problem. The current episode started 3 to 5 hours ago. The problem occurs constantly. The problem has not changed since onset.The pain is associated with alcohol use (last drank 3 days ago). The pain is located in the epigastric region (radiates to chest). The quality of the pain is burning and sharp. The pain is moderate. Associated symptoms include nausea. Pertinent negatives include fever, hematochezia and melena. The symptoms are aggravated by eating. Nothing relieves the symptoms.    Past Medical History:  Diagnosis Date  . Alcoholism /alcohol abuse ([place])   . Anemia   . Anxiety   . Arthritis    "knees; arms; elbows" (03/26/2015)  . Asthma   . Bipolar disorder ([place])   . Chronic bronchitis ([place])   . Chronic lower back pain   . Cocaine abuse   . Depression   . Family history of adverse reaction to anesthesia    "grandmother gets confused"  . Femoral condyle fracture ([place]) 03/08/2014   left medial/notes 03/09/2014  . GERD (gastroesophageal reflux disease)   . H/O hiatal hernia   . H/O suicide attempt 10/2012  . Heart murmur    "when he was little" (03/06/2013)  . High cholesterol   . History of blood transfusion 10/2012   "when I tried to commit suicide"  . History of stomach ulcers   . Hypertension   . Marijuana abuse, continuous   . Migraine    "a few times/year" (03/26/2015)  . Pancreatitis   . Pneumonia 1990's X 3  . PTSD (post-traumatic stress disorder)   . Shortness of breath    "can happen at anytime" (03/06/2013)  . Sickle cell trait ([place])   . WPW (Wolff-Parkinson-White syndrome)    [name] 03/06/2013    Patient Active  Problem List   Diagnosis Date Noted  . Alcohol use disorder, severe, dependence ([place]) 07/25/2016  . Cocaine use disorder, severe, dependence ([place]) 07/25/2016  . Major depressive disorder, recurrent severe without psychotic features ([place]) 07/20/2016  . Leukocytosis   . Hospital acquired PNA 05/20/2015  . Pancreatitis 05/18/2015  . Pseudocyst of pancreas 05/18/2015  . Malnutrition of moderate degree 03/27/2015  . Abnormal transaminases 03/26/2015  . Polysubstance abuse (tobacco, cocaine, THC, and ETOH) 03/26/2015  . Acute pancreatitis 03/26/2015  . Alcohol-induced chronic pancreatitis ([place])   . Essential hypertension 02/06/2014  . Alcohol-induced acute pancreatitis 11/28/2013  . Pancreatic pseudocyst/cyst 11/25/2013  . Alcohol dependence ([place]) 10/23/2013  . MDD (major depressive disorder), recurrent severe, without psychosis ([place]) 10/22/2013  . Protein-calorie malnutrition, severe ([place]) 10/10/2013  . Suicide attempt ([place]) 10/08/2013  . 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 TOBACCO ABUSE 03/23/2007    Past Surgical History:  Procedure Laterality Date  . CARDIAC CATHETERIZATION    . EYE SURGERY Left 1990's   "result of trauma"   . FACIAL FRACTURE SURGERY Left 1990's   "result of trauma"   . FRACTURE SURGERY    . HERNIA REPAIR    . LEFT HEART CATHETERIZATION WITH CORONARY ANGIOGRAM Right 03/07/2013   Procedure: LEFT HEART CATHETERIZATION WITH CORONARY ANGIOGRAM;  Surgeon: Lorenda Ishihara  Doylene Canard, MD;  Location: [facility] CATH LAB;  Service: Cardiovascular;  Laterality: Right;  . UMBILICAL HERNIA REPAIR         Home Medications    Prior to Admission medications   Medication Sig Start Date End Date Taking? Authorizing Provider  albuterol (PROVENTIL HFA;VENTOLIN HFA) 108 (90 Base) MCG/ACT inhaler Inhale 2 puffs into the lungs every 6 (six) hours as needed for wheezing or shortness of breath. 07/25/16  Yes Lindell Spar I, NP  cholestyramine (QUESTRAN) 4 g packet  Take 1 packet (4 g total) by mouth 3 (three) times daily as needed (diarrhea). 07/25/16  Yes Lindell Spar I, NP  famotidine (PEPCID) 20 MG tablet Take 1 tablet (20 mg total) by mouth 2 (two) times daily. 08/10/16  Yes Charlesetta Shanks, MD  fluticasone (FLONASE) 50 MCG/ACT nasal spray Place 1 spray into both nostrils daily as needed for rhinitis. 07/25/16  Yes Lindell Spar I, NP  gabapentin (NEURONTIN) 300 MG capsule Take 1 capsule (300 mg total) by mouth 3 (three) times daily. For agitation 07/25/16  Yes Lindell Spar I, NP  haloperidol (HALDOL) 10 MG tablet Take 10 mg by mouth at bedtime. 10/12/16  Yes [provider]  hydrocerin (EUCERIN) CREA Apply 1 application topically 2 (two) times daily. For dry skin Patient taking differently: Apply 1 application topically 2 (two) times daily as needed. For dry skin 07/25/16  Yes Lindell Spar I, NP  hydrOXYzine (ATARAX/VISTARIL) 25 MG tablet Take 1 tablet (25 mg total) by mouth every 6 (six) hours as needed for anxiety. 07/25/16  Yes Lindell Spar I, NP  lipase/protease/amylase (CREON) 12000 units CPEP capsule Take 2 capsules (24,000 Units total) by mouth 3 (three) times daily with meals. For pancreatitis 07/25/16  Yes Lindell Spar I, NP  loratadine (CLARITIN) 10 MG tablet Take 1 tablet (10 mg total) by mouth daily. (May purchase from over the counter): For allergies 07/26/16  Yes Lindell Spar I, NP  metoprolol tartrate (LOPRESSOR) 25 MG tablet Take 1 tablet (25 mg total) by mouth 2 (two) times daily. For high blood pressure 07/25/16  Yes Nwoko, Agnes I, NP  naltrexone (DEPADE) 50 MG tablet Take 1 tablet (50 mg total) by mouth daily. For alcoholism 07/26/16  Yes Lindell Spar I, NP  pantoprazole (PROTONIX) 40 MG tablet Take 1 tablet (40 mg total) by mouth daily. 09/24/16  Yes Julianne Rice, MD  promethazine (PHENERGAN) 25 MG tablet Take 1 tablet (25 mg total) by mouth every 6 (six) hours as needed for nausea or vomiting. 08/25/16  Yes Kirichenko, Tatyana, PA-C    sertraline (ZOLOFT) 25 MG tablet Take 3 tablets (75 mg total) by mouth daily. For depression 07/26/16  Yes Nwoko, Herbert Pun I, NP  sucralfate (CARAFATE) 1 g tablet Take 1 tablet (1 g total) by mouth 4 (four) times daily -  with meals and at bedtime. 09/26/16  Yes Horton, Barbette Hair, MD  benzocaine (ORAJEL) 10 % mucosal gel Use as directed in the mouth or throat 4 (four) times daily as needed for mouth pain. Patient not taking: Reported on 08/25/2016 07/25/16   Lindell Spar I, NP  haloperidol (HALDOL) 5 MG tablet Take 1 tablet (5 mg total) by mouth 2 (two) times daily. For mood control Patient not taking: Reported on 11/22/2016 07/25/16   Lindell Spar I, NP  HYDROcodone-acetaminophen (NORCO/VICODIN) 5-325 MG tablet Take 1 tablet by mouth every 6 (six) hours as needed. Patient not taking: Reported on 11/22/2016 08/26/16   Junius Creamer, NP  nicotine polacrilex (NICORETTE) 2  MG gum Take 1 each (2 mg total) by mouth as needed for smoking cessation. 07/25/16   Lindell Spar I, NP  ondansetron (ZOFRAN ODT) 4 MG disintegrating tablet Take 1 tablet (4 mg total) by mouth every 8 (eight) hours as needed for nausea or vomiting. 09/26/16   Horton, Barbette Hair, MD  ondansetron (ZOFRAN) 4 MG tablet Take 1 tablet (4 mg total) by mouth every 8 (eight) hours as needed for nausea or vomiting. Patient not taking: Reported on 11/22/2016 09/24/16   Julianne Rice, MD  sucralfate (CARAFATE) 1 g tablet Take 1 tablet (1 g total) by mouth 4 (four) times daily -  with meals and at bedtime. Patient not taking: Reported on 11/22/2016 09/24/16   Julianne Rice, MD    Family History Family History  Problem Relation Age of Onset  . Hypertension Other   . Coronary artery disease Other     Social History Social History  Substance Use Topics  . Smoking status: Current Every Day Smoker    Packs/day: 1.50    Years: 30.00    Types: Cigarettes  . Smokeless tobacco: Current User    Types: Chew  . Alcohol use 7.2 oz/week    12 Cans of  beer per week     Comment: last drink 08/23/16     Allergies   Shellfish-derived products; Trazodone and nefazodone; Robaxin [methocarbamol]; and Reglan [metoclopramide]   Review of Systems Review of Systems  Constitutional: Negative for fever.  Gastrointestinal: Positive for nausea. Negative for hematochezia and melena.  All other systems are reviewed and are negative for acute change except as noted in the HPI    Physical Exam Updated Vital Signs BP (!) 120/103 (BP Location: Left Arm)   Pulse (!) 120   Temp 98.5 F (36.9 C)   Resp 18   Ht 5\' 8"  (1.727 m)   Wt 68 kg (150 lb)   SpO2 100%   BMI 22.81 kg/m   Physical Exam  Constitutional: He is oriented to person, place, and time. He appears well-developed and well-nourished. No distress.  HENT:  Head: Normocephalic and atraumatic.  Nose: Nose normal.  Eyes: Pupils are equal, round, and reactive to light. Conjunctivae and EOM are normal. Right eye exhibits no discharge. Left eye exhibits no discharge. No scleral icterus.  Neck: Normal range of motion. Neck supple.  Cardiovascular: Normal rate and regular rhythm.  Exam reveals no gallop and no friction rub.   No murmur heard. Pulmonary/Chest: Effort normal and breath sounds normal. No stridor. No respiratory distress. He has no rales.  Abdominal: Soft. He exhibits no distension. There is no hepatosplenomegaly. There is tenderness in the left upper quadrant. There is no rigidity, no rebound, no guarding, no CVA tenderness and negative Murphy's sign. No hernia.  Musculoskeletal: He exhibits no edema or tenderness.  Neurological: He is alert and oriented to person, place, and time.  Skin: Skin is warm and dry. No rash noted. He is not diaphoretic. No erythema.  Psychiatric: He has a normal mood and affect.  Vitals reviewed.    ED Treatments / Results  Labs (all labs ordered are listed, but only abnormal results are displayed) Labs Reviewed  BASIC METABOLIC PANEL -  Abnormal; Notable for the following:       Result Value   CO2 19 (*)    Glucose, Bld 109 (*)    All other components within normal limits  CBC - Abnormal; Notable for the following:    RBC 3.88 (*)  Hemoglobin 11.5 (*)    HCT 34.0 (*)    RDW 18.4 (*)    All other components within normal limits  LIPASE, BLOOD - Abnormal; Notable for the following:    Lipase 114 (*)    All other components within normal limits  HEPATIC FUNCTION PANEL - Abnormal; Notable for the following:    Total Protein 8.4 (*)    AST 47 (*)    All other components within normal limits  I-STAT TROPOININ, ED    EKG  EKG Interpretation  Date/Time:  Tuesday November 22 2016 13:14:29 EDT Ventricular Rate:  120 PR Interval:    QRS Duration: 74 QT Interval:  265 QTC Calculation: 375 R Axis:   74 Text Interpretation:  Sinus tachycardia LAE, consider biatrial enlargement Borderline repolarization abnormality No significant change since last tracing Confirmed by Addison Lank [phone]) on 11/22/2016 4:50:05 PM       Radiology Dg Chest 2 View  Result Date: 11/22/2016 CLINICAL DATA:  Pancreatitis.  Chest pain and shortness of breath EXAM: CHEST  2 VIEW COMPARISON:  July 03, 2016 and February 20, 2016 chest radiographs; November 26, 2014 chest CT FINDINGS: There is a questionable nipple shadow on the left. There is a 6 mm nodular opacity in the periphery of the left upper lobe, question bleed present on most recent chest radiograph but not appreciable 9 months prior and not seen on prior CT 2 years prior. Lungs elsewhere clear. Heart size and pulmonary vascularity are normal. No adenopathy. No bone lesions. No pneumothorax. IMPRESSION: 6 mm nodular opacity left upper lobe. Questionable nipple shadow on the left. Advise noncontrast enhanced chest CT to further assess. Lungs elsewhere clear. No adenopathy. Stable cardiac silhouette. Electronically Signed   By: Lowella Grip III M.D.   On: 11/22/2016 14:07     Procedures Procedures (including critical care time)  Medications Ordered in ED Medications  ondansetron (ZOFRAN) injection 4 mg (4 mg Intravenous Given 11/22/16 1644)  gi cocktail (Maalox,Lidocaine,Donnatal) (30 mLs Oral Given 11/22/16 1635)     Initial Impression / Assessment and Plan / ED Course  I have reviewed the triage vital signs and the nursing notes.  Pertinent labs & imaging results that were available during my care of the patient were reviewed by me and considered in my medical decision making (see chart for details).     Exacerbation of patient's chronic pancreatitis due to recent alcohol consumption. Labs grossly reassuring. Patient provided with GI cocktail resulting in some improvement in symptomatology. Able to tolerate by mouth. No indication for advanced imaging at this time.  The patient is safe for discharge with strict return precautions.   Final Clinical Impressions(s) / ED Diagnoses   Final diagnoses:  Epigastric pain    Patient eloped prior to providing him with his discharge paperwork.   Fatima Blank, MD 11/23/16 [phone]

## 2016-11-22 NOTE — ED Triage Notes (Addendum)
Pt arrives via [facility] and sent to triage - he reports generalized abdominal pain and nausea. Denies vomiting or diarrhea. Hx of pancreatitis. Denies consuming alcohol today but did yesterday. Bp- 144/90, HR-108, RR-18, CBG-89

## 2016-11-23 ENCOUNTER — Emergency Department (HOSPITAL_COMMUNITY)
Admission: EM | Admit: 2016-11-23 | Discharge: 2016-11-23 | Disposition: A | Discharge status: Home / Self Care | Payer: Self-pay | Attending: Emergency Medicine | Admitting: Emergency Medicine

## 2016-11-23 DIAGNOSIS — R109 Unspecified abdominal pain: Secondary | ICD-10-CM

## 2016-11-23 DIAGNOSIS — G8929 Other chronic pain: Secondary | ICD-10-CM

## 2016-11-23 MED ORDER — GI COCKTAIL ~~LOC~~
30.0000 mL | Freq: Once | ORAL | Status: AC
  Administered 2016-11-23: 30 mL via ORAL
  Filled 2016-11-23: qty 30

## 2016-11-23 MED ORDER — DICYCLOMINE HCL 10 MG PO CAPS
10.0000 mg | ORAL_CAPSULE | Freq: Once | ORAL | Status: AC
  Administered 2016-11-23: 10 mg via ORAL
  Filled 2016-11-23: qty 1

## 2016-11-23 MED ORDER — KETOROLAC TROMETHAMINE 60 MG/2ML IM SOLN
60.0000 mg | Freq: Once | INTRAMUSCULAR | Status: AC
  Administered 2016-11-23: 60 mg via INTRAMUSCULAR
  Filled 2016-11-23: qty 2

## 2016-11-23 MED ORDER — ONDANSETRON 4 MG PO TBDP
8.0000 mg | ORAL_TABLET | Freq: Once | ORAL | Status: AC
  Administered 2016-11-23: 8 mg via ORAL
  Filled 2016-11-23: qty 2

## 2016-11-23 NOTE — ED Provider Notes (Signed)
[facility] DEPT Provider Note   CSN: [id number] Arrival date & time: 11/22/16  2100  By signing my name below, I, Margit Banda, attest that this documentation has been prepared under the direction and in the presence of Pollina, Gwenyth Allegra, MD. Electronically Signed: Margit Banda, ED Scribe. 11/23/16. 1:21 AM.  History   Chief Complaint Chief Complaint  Patient presents with  . Abdominal Pain    HPI [name] is a 46 y.o. male with a PMHx of pancreatitis and ETOH abuse, who presents to the Emergency Department complaining of moderate, generalized, abdominal pain that started PTA. Associated sx include nausea. Pt reports drinking during the day of 11/22/16. Pt denies fever, vomiting, and diarrhea.  The history is provided by the patient. No language interpreter was used.    Past Medical History:  Diagnosis Date  . Alcoholism /alcohol abuse ([place])   . Anemia   . Anxiety   . Arthritis    "knees; arms; elbows" (03/26/2015)  . Asthma   . Bipolar disorder ([place])   . Chronic bronchitis ([place])   . Chronic lower back pain   . Cocaine abuse   . Depression   . Family history of adverse reaction to anesthesia    "grandmother gets confused"  . Femoral condyle fracture ([place]) 03/08/2014   left medial/notes 03/09/2014  . GERD (gastroesophageal reflux disease)   . H/O hiatal hernia   . H/O suicide attempt 10/2012  . Heart murmur    "when he was little" (03/06/2013)  . High cholesterol   . History of blood transfusion 10/2012   "when I tried to commit suicide"  . History of stomach ulcers   . Hypertension   . Marijuana abuse, continuous   . Migraine    "a few times/year" (03/26/2015)  . Pancreatitis   . Pneumonia 1990's X 3  . PTSD (post-traumatic stress disorder)   . Shortness of breath    "can happen at anytime" (03/06/2013)  . Sickle cell trait ([place])   . WPW (Wolff-Parkinson-White syndrome)    [name] 03/06/2013    Patient Active Problem List   Diagnosis Date Noted  . Alcohol use disorder, severe, dependence ([place]) 07/25/2016  . Cocaine use disorder, severe, dependence ([place]) 07/25/2016  . Major depressive disorder, recurrent severe without psychotic features ([place]) 07/20/2016  . Leukocytosis   . Hospital acquired PNA 05/20/2015  . Pancreatitis 05/18/2015  . Pseudocyst of pancreas 05/18/2015  . Malnutrition of moderate degree 03/27/2015  . Abnormal transaminases 03/26/2015  . Polysubstance abuse (tobacco, cocaine, THC, and ETOH) 03/26/2015  . Acute pancreatitis 03/26/2015  . Alcohol-induced chronic pancreatitis ([place])   . Essential hypertension 02/06/2014  . Alcohol-induced acute pancreatitis 11/28/2013  . Pancreatic pseudocyst/cyst 11/25/2013  . Alcohol dependence ([place]) 10/23/2013  . MDD (major depressive disorder), recurrent severe, without psychosis ([place]) 10/22/2013  . Protein-calorie malnutrition, severe ([place]) 10/10/2013  . Suicide attempt ([place]) 10/08/2013  . 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 TOBACCO ABUSE 03/23/2007    Past Surgical History:  Procedure Laterality Date  . CARDIAC CATHETERIZATION    . EYE SURGERY Left 1990's   "result of trauma"   . FACIAL FRACTURE SURGERY Left 1990's   "result of trauma"   . FRACTURE SURGERY    . HERNIA REPAIR    . LEFT HEART CATHETERIZATION WITH CORONARY ANGIOGRAM Right 03/07/2013   Procedure: LEFT HEART CATHETERIZATION WITH CORONARY ANGIOGRAM;  Surgeon: [name], MD;  Location: [facility] CATH [facility];  Service: Cardiovascular;  Laterality: Right;  .  UMBILICAL HERNIA REPAIR         Home Medications    Prior to Admission medications   Medication Sig Start Date End Date Taking? Authorizing Provider  albuterol (PROVENTIL HFA;VENTOLIN HFA) 108 (90 Base) MCG/ACT inhaler Inhale 2 puffs into the lungs every 6 (six) hours as needed for wheezing or shortness of breath. 07/25/16   Lindell Spar I, NP  benzocaine (ORAJEL) 10 % mucosal gel Use as directed in  the mouth or throat 4 (four) times daily as needed for mouth pain. Patient not taking: Reported on 08/25/2016 07/25/16   Lindell Spar I, NP  cholestyramine (QUESTRAN) 4 g packet Take 1 packet (4 g total) by mouth 3 (three) times daily as needed (diarrhea). 07/25/16   Lindell Spar I, NP  famotidine (PEPCID) 20 MG tablet Take 1 tablet (20 mg total) by mouth 2 (two) times daily. 08/10/16   Charlesetta Shanks, MD  fluticasone (FLONASE) 50 MCG/ACT nasal spray Place 1 spray into both nostrils daily as needed for rhinitis. 07/25/16   Lindell Spar I, NP  gabapentin (NEURONTIN) 300 MG capsule Take 1 capsule (300 mg total) by mouth 3 (three) times daily. For agitation 07/25/16   Lindell Spar I, NP  haloperidol (HALDOL) 10 MG tablet Take 10 mg by mouth at bedtime. 10/12/16   [provider]  haloperidol (HALDOL) 5 MG tablet Take 1 tablet (5 mg total) by mouth 2 (two) times daily. For mood control Patient not taking: Reported on 11/22/2016 07/25/16   Lindell Spar I, NP  hydrocerin (EUCERIN) CREA Apply 1 application topically 2 (two) times daily. For dry skin Patient taking differently: Apply 1 application topically 2 (two) times daily as needed. For dry skin 07/25/16   Lindell Spar I, NP  HYDROcodone-acetaminophen (NORCO/VICODIN) 5-325 MG tablet Take 1 tablet by mouth every 6 (six) hours as needed. Patient not taking: Reported on 11/22/2016 08/26/16   Junius Creamer, NP  hydrOXYzine (ATARAX/VISTARIL) 25 MG tablet Take 1 tablet (25 mg total) by mouth every 6 (six) hours as needed for anxiety. 07/25/16   Lindell Spar I, NP  lipase/protease/amylase (CREON) 12000 units CPEP capsule Take 2 capsules (24,000 Units total) by mouth 3 (three) times daily with meals. For pancreatitis 07/25/16   Lindell Spar I, NP  loratadine (CLARITIN) 10 MG tablet Take 1 tablet (10 mg total) by mouth daily. (May purchase from over the counter): For allergies 07/26/16   Lindell Spar I, NP  metoprolol tartrate (LOPRESSOR) 25 MG tablet Take 1 tablet (25  mg total) by mouth 2 (two) times daily. For high blood pressure 07/25/16   Nwoko, Herbert Pun I, NP  naltrexone (DEPADE) 50 MG tablet Take 1 tablet (50 mg total) by mouth daily. For alcoholism 07/26/16   Lindell Spar I, NP  nicotine polacrilex (NICORETTE) 2 MG gum Take 1 each (2 mg total) by mouth as needed for smoking cessation. 07/25/16   Lindell Spar I, NP  ondansetron (ZOFRAN ODT) 4 MG disintegrating tablet Take 1 tablet (4 mg total) by mouth every 8 (eight) hours as needed for nausea or vomiting. 09/26/16   Horton, Barbette Hair, MD  ondansetron (ZOFRAN) 4 MG tablet Take 1 tablet (4 mg total) by mouth every 8 (eight) hours as needed for nausea or vomiting. Patient not taking: Reported on 11/22/2016 09/24/16   Julianne Rice, MD  pantoprazole (PROTONIX) 40 MG tablet Take 1 tablet (40 mg total) by mouth daily. 09/24/16   Julianne Rice, MD  promethazine (PHENERGAN) 25 MG tablet Take 1 tablet (25 mg  total) by mouth every 6 (six) hours as needed for nausea or vomiting. 08/25/16   Kirichenko, Tatyana, PA-C  sertraline (ZOLOFT) 25 MG tablet Take 3 tablets (75 mg total) by mouth daily. For depression 07/26/16   Lindell Spar I, NP  sucralfate (CARAFATE) 1 g tablet Take 1 tablet (1 g total) by mouth 4 (four) times daily -  with meals and at bedtime. Patient not taking: Reported on 11/22/2016 09/24/16   Julianne Rice, MD  sucralfate (CARAFATE) 1 g tablet Take 1 tablet (1 g total) by mouth 4 (four) times daily -  with meals and at bedtime. 09/26/16   Horton, Barbette Hair, MD    Family History Family History  Problem Relation Age of Onset  . Hypertension Other   . Coronary artery disease Other     Social History Social History  Substance Use Topics  . Smoking status: Current Every Day Smoker    Packs/day: 1.50    Years: 30.00    Types: Cigarettes  . Smokeless tobacco: Current User    Types: Chew  . Alcohol use 7.2 oz/week    12 Cans of beer per week     Comment: last drink 08/23/16     Allergies     Shellfish-derived products; Trazodone and nefazodone; Robaxin [methocarbamol]; and Reglan [metoclopramide]   Review of Systems Review of Systems  Constitutional: Negative for fever.  Gastrointestinal: Positive for abdominal pain and nausea. Negative for diarrhea and vomiting.  All other systems reviewed and are negative.    Physical Exam Updated Vital Signs BP 140/88 (BP Location: Right Arm)   Pulse (!) 107   Temp 98.6 F (37 C) (Oral)   Resp 20   SpO2 100%   Physical Exam  Constitutional: He is oriented to person, place, and time. He appears well-developed and well-nourished. No distress.  HENT:  Head: Normocephalic and atraumatic.  Right Ear: Hearing normal.  Left Ear: Hearing normal.  Nose: Nose normal.  Mouth/Throat: Oropharynx is clear and moist and mucous membranes are normal.  Eyes: Pupils are equal, round, and reactive to light. Conjunctivae and EOM are normal.  Neck: Normal range of motion. Neck supple.  Cardiovascular: Regular rhythm, S1 normal and S2 normal.  Exam reveals no gallop and no friction rub.   No murmur heard. Pulmonary/Chest: Effort normal and breath sounds normal. No respiratory distress. He exhibits no tenderness.  Abdominal: Soft. Normal appearance and bowel sounds are normal. There is no hepatosplenomegaly. There is tenderness in the left upper quadrant. There is no rebound, no guarding, no tenderness at McBurney's point and negative Murphy's sign. No hernia.  Musculoskeletal: Normal range of motion.  Neurological: He is alert and oriented to person, place, and time. He has normal strength. No cranial nerve deficit or sensory deficit. Coordination normal. GCS eye subscore is 4. GCS verbal subscore is 5. GCS motor subscore is 6.  Skin: Skin is warm, dry and intact. No rash noted. No cyanosis.  Psychiatric: He has a normal mood and affect. His speech is normal and behavior is normal. Thought content normal.  Nursing note and vitals reviewed.    ED  Treatments / Results  DIAGNOSTIC STUDIES: Oxygen Saturation is 100% on RA, normal by my interpretation.   COORDINATION OF CARE: 1:21 AM-Discussed next steps with pt. Pt verbalized understanding and is agreeable with the plan.   Labs (all labs ordered are listed, but only abnormal results are displayed) Labs Reviewed  LIPASE, BLOOD - Abnormal; Notable for the following:  Result Value   Lipase 133 (*)    All other components within normal limits  COMPREHENSIVE METABOLIC PANEL - Abnormal; Notable for the following:    Sodium 133 (*)    Potassium 3.1 (*)    CO2 16 (*)    Glucose, Bld 102 (*)    BUN <5 (*)    All other components within normal limits  CBC - Abnormal; Notable for the following:    RBC 3.80 (*)    Hemoglobin 11.0 (*)    HCT 33.1 (*)    RDW 18.3 (*)    All other components within normal limits  URINALYSIS, ROUTINE W REFLEX MICROSCOPIC  TROPONIN I    EKG  EKG Interpretation None       Radiology Dg Chest 2 View  Result Date: 11/22/2016 CLINICAL DATA:  Pancreatitis.  Chest pain and shortness of breath EXAM: CHEST  2 VIEW COMPARISON:  July 03, 2016 and February 20, 2016 chest radiographs; November 26, 2014 chest CT FINDINGS: There is a questionable nipple shadow on the left. There is a 6 mm nodular opacity in the periphery of the left upper lobe, question bleed present on most recent chest radiograph but not appreciable 9 months prior and not seen on prior CT 2 years prior. Lungs elsewhere clear. Heart size and pulmonary vascularity are normal. No adenopathy. No bone lesions. No pneumothorax. IMPRESSION: 6 mm nodular opacity left upper lobe. Questionable nipple shadow on the left. Advise noncontrast enhanced chest CT to further assess. Lungs elsewhere clear. No adenopathy. Stable cardiac silhouette. Electronically Signed   By: Lowella Grip III M.D.   On: 11/22/2016 14:07    Procedures Procedures (including critical care time)  Medications Ordered in  ED Medications - No data to display   Initial Impression / Assessment and Plan / ED Course  I have reviewed the triage vital signs and the nursing notes.  Pertinent labs & imaging results that were available during my care of the patient were reviewed by me and considered in my medical decision making (see chart for details).     Patient presents to the emergency department for evaluation of abdominal pain. Patient well-known to this ER. This is his 19th visit in 6 months with similar complaints. Patient tends to come to the ER with abdominal pain that is exacerbated by alcohol intake. He admits to binge drinking this past weekend. Lipase is about at its baseline, no significant elevation. Vital signs are stable. No significant abnormalities noted on examination. He has his chronic left upper quadrant tenderness but no guarding or rebound. No tenderness elsewhere to suggest acute surgical process. Patient has a care plan in place. Treated with nonnarcotic medication, encouraged to follow-up with primary care.  Final Clinical Impressions(s) / ED Diagnoses   Final diagnoses:  None    New Prescriptions New Prescriptions   No medications on file   I personally performed the services described in this documentation, which was scribed in my presence. The recorded information has been reviewed and is accurate.     Orpah Greek, MD 11/23/16 [phone]

## 2016-12-04 ENCOUNTER — Encounter (HOSPITAL_COMMUNITY): Payer: Self-pay

## 2016-12-04 ENCOUNTER — Emergency Department (HOSPITAL_COMMUNITY)
Admission: EM | Admit: 2016-12-04 | Discharge: 2016-12-04 | Disposition: A | Discharge status: Home / Self Care | Payer: Self-pay | Attending: Emergency Medicine | Admitting: Emergency Medicine

## 2016-12-04 ENCOUNTER — Emergency Department (HOSPITAL_COMMUNITY): Payer: Self-pay

## 2016-12-04 DIAGNOSIS — R197 Diarrhea, unspecified: Secondary | ICD-10-CM | POA: Insufficient documentation

## 2016-12-04 DIAGNOSIS — R1013 Epigastric pain: Secondary | ICD-10-CM

## 2016-12-04 DIAGNOSIS — R112 Nausea with vomiting, unspecified: Secondary | ICD-10-CM | POA: Insufficient documentation

## 2016-12-04 DIAGNOSIS — K860 Alcohol-induced chronic pancreatitis: Secondary | ICD-10-CM | POA: Insufficient documentation

## 2016-12-04 LAB — LIPASE, BLOOD: Lipase: 355 U/L — ABNORMAL HIGH (ref 11–51)

## 2016-12-04 LAB — COMPREHENSIVE METABOLIC PANEL
ALBUMIN: 3.7 g/dL (ref 3.5–5.0)
ALK PHOS: 78 U/L (ref 38–126)
ALT: 29 U/L (ref 17–63)
ANION GAP: 8 (ref 5–15)
AST: 33 U/L (ref 15–41)
BUN: 11 mg/dL (ref 6–20)
CALCIUM: 8.8 mg/dL — AB (ref 8.9–10.3)
CO2: 27 mmol/L (ref 22–32)
Chloride: 103 mmol/L (ref 101–111)
Creatinine, Ser: 1.13 mg/dL (ref 0.61–1.24)
GFR calc Af Amer: >60 mL/min (ref >60)
GFR calc non Af Amer: >60 mL/min (ref >60)
GLUCOSE: 99 mg/dL (ref 65–99)
Potassium: 4.1 mmol/L (ref 3.5–5.1)
SODIUM: 138 mmol/L (ref 135–145)
Total Bilirubin: 0.2 mg/dL — ABNORMAL LOW (ref 0.3–1.2)
Total Protein: 7.6 g/dL (ref 6.5–8.1)

## 2016-12-04 LAB — URINALYSIS, ROUTINE W REFLEX MICROSCOPIC
Bilirubin Urine: NEGATIVE
Glucose, UA: NEGATIVE mg/dL
HGB URINE DIPSTICK: NEGATIVE
Ketones, ur: NEGATIVE mg/dL
Leukocytes, UA: NEGATIVE
Nitrite: NEGATIVE
PH: 7 (ref 5.0–8.0)
PROTEIN: NEGATIVE mg/dL
SPECIFIC GRAVITY, URINE: 1.009 (ref 1.005–1.030)

## 2016-12-04 LAB — CBC
HCT: 31.6 % — ABNORMAL LOW (ref 39.0–52.0)
Hemoglobin: 10.4 g/dL — ABNORMAL LOW (ref 13.0–17.0)
MCH: 29.1 pg (ref 26.0–34.0)
MCHC: 32.9 g/dL (ref 30.0–36.0)
MCV: 88.3 fL (ref 78.0–100.0)
Platelets: 369 10*3/uL (ref 150–400)
RBC: 3.58 MIL/uL — ABNORMAL LOW (ref 4.22–5.81)
RDW: 18.3 % — ABNORMAL HIGH (ref 11.5–15.5)
WBC: 10.6 10*3/uL — ABNORMAL HIGH (ref 4.0–10.5)

## 2016-12-04 LAB — ETHANOL

## 2016-12-04 MED ORDER — MORPHINE SULFATE (PF) 4 MG/ML IV SOLN
4.0000 mg | Freq: Once | INTRAVENOUS | Status: AC
  Administered 2016-12-04: 4 mg via INTRAVENOUS
  Filled 2016-12-04: qty 1

## 2016-12-04 MED ORDER — MORPHINE SULFATE (PF) 2 MG/ML IV SOLN
4.0000 mg | Freq: Once | INTRAVENOUS | Status: AC
  Administered 2016-12-04: 4 mg via INTRAVENOUS
  Filled 2016-12-04: qty 2

## 2016-12-04 MED ORDER — IOPAMIDOL (ISOVUE-300) INJECTION 61%
INTRAVENOUS | Status: AC
  Filled 2016-12-04: qty 100

## 2016-12-04 MED ORDER — FAMOTIDINE IN NACL 20-0.9 MG/50ML-% IV SOLN
20.0000 mg | Freq: Once | INTRAVENOUS | Status: AC
  Administered 2016-12-04: 20 mg via INTRAVENOUS
  Filled 2016-12-04: qty 50

## 2016-12-04 MED ORDER — ONDANSETRON HCL 4 MG/2ML IJ SOLN
4.0000 mg | Freq: Once | INTRAMUSCULAR | Status: AC
  Administered 2016-12-04: 4 mg via INTRAVENOUS
  Filled 2016-12-04: qty 2

## 2016-12-04 MED ORDER — GI COCKTAIL ~~LOC~~
30.0000 mL | Freq: Once | ORAL | Status: AC
  Administered 2016-12-04: 30 mL via ORAL
  Filled 2016-12-04: qty 30

## 2016-12-04 MED ORDER — SODIUM CHLORIDE 0.9 % IV BOLUS (SEPSIS)
1000.0000 mL | Freq: Once | INTRAVENOUS | Status: AC
  Administered 2016-12-04: 1000 mL via INTRAVENOUS

## 2016-12-04 MED ORDER — DIPHENHYDRAMINE HCL 50 MG/ML IJ SOLN
12.5000 mg | Freq: Once | INTRAMUSCULAR | Status: AC
  Administered 2016-12-04: 12.5 mg via INTRAVENOUS
  Filled 2016-12-04: qty 1

## 2016-12-04 MED ORDER — IOPAMIDOL (ISOVUE-300) INJECTION 61%
100.0000 mL | Freq: Once | INTRAVENOUS | Status: AC | PRN
  Administered 2016-12-04: 100 mL via INTRAVENOUS

## 2016-12-04 NOTE — ED Provider Notes (Signed)
[facility] DEPT Provider Note   CSN: [id number] Arrival date & time: 12/04/16  0225     History   Chief Complaint Chief Complaint  Patient presents with  . Abdominal Pain    HPI [name] is a 46 y.o. male.  [name] is a 46 y.o. Male with a history of pancreatitis and alcohol abuse who presents to the emergency department complaining of epigastric and left upper quadrant abdominal pain with associated nausea, vomiting and diarrhea. Patient reports he has a history of pancreatitis and was recently admitted at [facility] for this. Patient reports he was discharged about a week ago. He had a 6 day inpatient stay. He does not currently have a gastroenterologist. He reports his is supposed to follow up with a [facility]. He reports today he developed acute nausea, vomiting and diarrhea. 3 episodes of vomiting and 3 episodes of diarrhea. No hematemesis or hematochezia. He reports this feels the same as his pancreatitis. He admits to drinking 7 beers today. He reports he was sober during his most recent admission. He denies fevers, coughing, chest pain, shortness of breath, hematemesis, hematochezia, urinary symptoms or rashes.   The history is provided by the patient and medical records. No language interpreter was used.  Abdominal Pain   Associated symptoms include diarrhea, nausea and vomiting. Pertinent negatives include fever, dysuria and headaches.    Past Medical History:  Diagnosis Date  . Pancreatitis     There are no active problems to display for this patient.   History reviewed. No pertinent surgical history.     Home Medications    Prior to Admission medications   Medication Sig Start Date End Date Taking? Authorizing Provider  Alum & Mag Hydroxide-Simeth (GI COCKTAIL) SUSP suspension Take 30 mLs by mouth 2 (two) times daily. Shake well.   Yes [provider]  diphenhydrAMINE (BENADRYL) 25 mg capsule Take 25 mg by mouth every 6 (six)  hours as needed.   Yes [provider]  ibuprofen (ADVIL,MOTRIN) 200 MG tablet Take 400 mg by mouth every 6 (six) hours as needed.   Yes [provider]  ondansetron (ZOFRAN) 4 MG tablet Take 4 mg by mouth every 8 (eight) hours as needed for nausea or vomiting.   Yes [provider]  Pancrelipase, Lip-Prot-Amyl, (CREON) 24000-76000 units CPEP Take 24,000 Units by mouth 3 (three) times daily.   Yes [provider]    Family History History reviewed. No pertinent family history.  Social History Social History  Substance Use Topics  . Smoking status: Never Smoker  . Smokeless tobacco: Never Used  . Alcohol use No     Allergies   Contrast media [iodinated diagnostic agents]   Review of Systems Review of Systems  Constitutional: Negative for chills and fever.  HENT: Negative for congestion and sore throat.   Eyes: Negative for visual disturbance.  Respiratory: Negative for cough and shortness of breath.   Cardiovascular: Negative for chest pain.  Gastrointestinal: Positive for abdominal pain, diarrhea, nausea and vomiting. Negative for blood in stool.  Genitourinary: Negative for difficulty urinating and dysuria.  Musculoskeletal: Negative for back pain and neck pain.  Skin: Negative for rash.  Neurological: Negative for headaches.     Physical Exam Updated Vital Signs BP 131/89   Pulse 70   Temp 97.8 F (36.6 C) (Oral)   Resp 15   Ht 5\' 8"  (1.727 m)   Wt 59 kg (130 lb)   SpO2 100%   BMI  19.77 kg/m   Physical Exam  Constitutional: He is oriented to person, place, and time. He appears well-developed and well-nourished. No distress.  Nontoxic appearing.  HENT:  Head: Normocephalic and atraumatic.  Mouth/Throat: Oropharynx is clear and moist.  Eyes: Pupils are equal, round, and reactive to light. Conjunctivae are normal. Right eye exhibits no discharge. Left eye exhibits no discharge.  Neck: Neck supple.  Cardiovascular: Normal  rate, regular rhythm, normal heart sounds and intact distal pulses.  Exam reveals no gallop and no friction rub.   No murmur heard. Pulmonary/Chest: Effort normal and breath sounds normal. No respiratory distress. He has no wheezes. He has no rales.  Abdominal: Soft. Bowel sounds are normal. He exhibits no distension and no mass. There is tenderness. There is no rebound and no guarding.  Abdomen is soft. Bowel sounds are present. Patient has epigastric and left upper quadrant abdominal tenderness to palpation. No peritoneal signs.  Musculoskeletal: He exhibits no edema.  Lymphadenopathy:    He has no cervical adenopathy.  Neurological: He is alert and oriented to person, place, and time. Coordination normal.  Skin: Skin is warm and dry. Capillary refill takes less than 2 seconds. No rash noted. He is not diaphoretic. No erythema. No pallor.  Psychiatric: He has a normal mood and affect. His behavior is normal.  Nursing note and vitals reviewed.    ED Treatments / Results  Labs (all labs ordered are listed, but only abnormal results are displayed) Labs Reviewed  LIPASE, BLOOD - Abnormal; Notable for the following:       Result Value   Lipase 355 (*)    All other components within normal limits  COMPREHENSIVE METABOLIC PANEL - Abnormal; Notable for the following:    Calcium 8.8 (*)    Total Bilirubin 0.2 (*)    All other components within normal limits  CBC - Abnormal; Notable for the following:    WBC 10.6 (*)    RBC 3.58 (*)    Hemoglobin 10.4 (*)    HCT 31.6 (*)    RDW 18.3 (*)    All other components within normal limits  URINALYSIS, ROUTINE W REFLEX MICROSCOPIC - Abnormal; Notable for the following:    Color, Urine STRAW (*)    All other components within normal limits  ETHANOL    EKG  EKG Interpretation None       Radiology Ct Abdomen Pelvis W Contrast  Result Date: 12/04/2016 CLINICAL DATA:  History of pancreatitis. Abdominal pain, nausea, vomiting EXAM: CT  ABDOMEN AND PELVIS WITH CONTRAST TECHNIQUE: Multidetector CT imaging of the abdomen and pelvis was performed using the standard protocol following bolus administration of intravenous contrast. CONTRAST:  141mL ISOVUE-300 IOPAMIDOL (ISOVUE-300) INJECTION 61% COMPARISON:  None. FINDINGS: Lower chest: Lung bases are clear. No effusions. Heart is normal size. Hepatobiliary: No focal hepatic abnormality. Gallbladder unremarkable. Pancreas: Calcifications throughout the pancreas compatible with chronic pancreatitis. Cystic areas also throughout the pancreas. 12 mm cystic area in the pancreatic head. 2.7 cm cystic area in the upper body. 10 mm cystic area in the tail of. Mild ductal dilatation. No surrounding stranding/inflammation. Spleen: No focal abnormality.  Normal size. Adrenals/Urinary Tract: No adrenal abnormality. No focal renal abnormality. No stones or hydronephrosis. Urinary bladder is unremarkable. Stomach/Bowel: Moderate stool in the colon. Stomach, large and small bowel grossly unremarkable. Vascular/Lymphatic: Aortic and iliac calcifications. No aneurysm or adenopathy. Reproductive: No visible focal abnormality. Other: No free fluid or free air. Musculoskeletal: No acute bony abnormality. IMPRESSION: Changes of  chronic calcific pancreatitis. There is associated ductal dilatation and 3 cystic areas, the largest in the body measuring 2.7 cm, likely pseudocysts. No surrounding inflammation to suggest superimposed acute pancreatitis. Aortoiliac atherosclerosis. Electronically Signed   By: Rolm Baptise M.D.   On: 12/04/2016 07:38    Procedures Procedures (including critical care time)  Medications Ordered in ED Medications  iopamidol (ISOVUE-300) 61 % injection (not administered)  sodium chloride 0.9 % bolus 1,000 mL (0 mLs Intravenous Stopped 12/04/16 0823)  morphine 2 MG/ML injection 4 mg (4 mg Intravenous Given 12/04/16 0707)  ondansetron (ZOFRAN) injection 4 mg (4 mg Intravenous Given 12/04/16 0707)    famotidine (PEPCID) IVPB 20 mg premix (0 mg Intravenous Stopped 12/04/16 0819)  iopamidol (ISOVUE-300) 61 % injection 100 mL (100 mLs Intravenous Contrast Given 12/04/16 0720)  diphenhydrAMINE (BENADRYL) injection 12.5 mg (12.5 mg Intravenous Given 12/04/16 0810)  gi cocktail (Maalox,Lidocaine,Donnatal) (30 mLs Oral Given 12/04/16 0831)  morphine 4 MG/ML injection 4 mg (4 mg Intravenous Given 12/04/16 1505)     Initial Impression / Assessment and Plan / ED Course  I have reviewed the triage vital signs and the nursing notes.  Pertinent labs & imaging results that were available during my care of the patient were reviewed by me and considered in my medical decision making (see chart for details).    This  is a 46 y.o. Male with a history of pancreatitis and alcohol abuse who presents to the emergency department complaining of epigastric and left upper quadrant abdominal pain with associated nausea, vomiting and diarrhea. Patient reports he has a history of pancreatitis and was recently admitted at [facility] for this. Patient reports he was discharged about a week ago. He had a 6 day inpatient stay. He does not currently have a gastroenterologist. He reports his is supposed to follow up with a [facility]. He reports today he developed acute nausea, vomiting and diarrhea. 3 episodes of vomiting and 3 episodes of diarrhea. No hematemesis or hematochezia. He reports this feels the same as his pancreatitis. He admits to drinking 7 beers today. On examination he is afebrile nontoxic appearing. His abdomen is soft and he has epigastric and left upper quadrant abdominal tenderness to palpation. Mucous membranes are moist.  Lipase is mildly elevated at 355. No recent to compare. CMP is unremarkable. CBC is unremarkable. Urinalysis is without sign of infection. CT abdomen pelvis she is changes of chronic pancreatitis. No surrounding inflammation to suggest superimposed acute pancreatitis. Patient  received fluids, pain medicine and nausea medication. At reevaluation he is feeling much better. He was able to tolerate water and crackers without nausea or vomiting. He has a flare of his pancreatic had a to drinking alcohol today. No evidence of acute pancreatitis. Will discharge today with close follow-up by GI and primary care. I discussed that he needs to stop drinking alcohol. Return precautions discussed. I advised the patient to follow-up with their primary care provider this week. I advised the patient to return to the emergency department with new or worsening symptoms or new concerns. The patient verbalized understanding and agreement with plan.      Final Clinical Impressions(s) / ED Diagnoses   Final diagnoses:  Alcohol-induced chronic pancreatitis (HCC)  Nausea vomiting and diarrhea  Epigastric abdominal pain    New Prescriptions New Prescriptions   No medications on file     Sharmaine Base 69/79/48 [id number]    Delora Fuel, MD 53/74/82 [id number]

## 2016-12-04 NOTE — ED Notes (Signed)
Shortly after returning from his CT he developed a few small urticaria at forearm just proximal to IV. (No urticaria noted anywhere else; and he is having no areas of swelling, nor any throat or oral involvement.

## 2016-12-04 NOTE — ED Triage Notes (Addendum)
Abdominal pain with hx of pancreatitis off and on since 2200 pm nausea and vomiting. Was in [facility] inpatient for one week admitted and discharged on Monday of this week for pancreatitis.

## 2016-12-05 ENCOUNTER — Observation Stay (HOSPITAL_COMMUNITY)
Admission: EM | Admit: 2016-12-05 | Discharge: 2016-12-06 | Discharge status: Against Medical Advice | Payer: Self-pay | Attending: Internal Medicine | Admitting: Internal Medicine

## 2016-12-05 ENCOUNTER — Encounter (HOSPITAL_COMMUNITY): Payer: Self-pay | Admitting: Emergency Medicine

## 2016-12-05 DIAGNOSIS — F1090 Alcohol use, unspecified, uncomplicated: Secondary | ICD-10-CM | POA: Diagnosis present

## 2016-12-05 DIAGNOSIS — R112 Nausea with vomiting, unspecified: Secondary | ICD-10-CM | POA: Diagnosis present

## 2016-12-05 DIAGNOSIS — Z79899 Other long term (current) drug therapy: Secondary | ICD-10-CM | POA: Insufficient documentation

## 2016-12-05 DIAGNOSIS — K852 Alcohol induced acute pancreatitis without necrosis or infection: Secondary | ICD-10-CM

## 2016-12-05 DIAGNOSIS — K859 Acute pancreatitis without necrosis or infection, unspecified: Secondary | ICD-10-CM | POA: Insufficient documentation

## 2016-12-05 DIAGNOSIS — F10229 Alcohol dependence with intoxication, unspecified: Secondary | ICD-10-CM

## 2016-12-05 DIAGNOSIS — K860 Alcohol-induced chronic pancreatitis: Principal | ICD-10-CM | POA: Diagnosis present

## 2016-12-05 DIAGNOSIS — F1020 Alcohol dependence, uncomplicated: Secondary | ICD-10-CM | POA: Diagnosis present

## 2016-12-05 DIAGNOSIS — D649 Anemia, unspecified: Secondary | ICD-10-CM | POA: Diagnosis present

## 2016-12-05 DIAGNOSIS — R4689 Other symptoms and signs involving appearance and behavior: Secondary | ICD-10-CM | POA: Diagnosis present

## 2016-12-05 DIAGNOSIS — F10129 Alcohol abuse with intoxication, unspecified: Secondary | ICD-10-CM | POA: Diagnosis present

## 2016-12-05 LAB — CBC
HEMATOCRIT: 33.2 % — AB (ref 39.0–52.0)
HEMOGLOBIN: 10.9 g/dL — AB (ref 13.0–17.0)
MCH: 28.4 pg (ref 26.0–34.0)
MCHC: 32.8 g/dL (ref 30.0–36.0)
MCV: 86.5 fL (ref 78.0–100.0)
Platelets: 385 10*3/uL (ref 150–400)
RBC: 3.84 MIL/uL — AB (ref 4.22–5.81)
RDW: 18.2 % — ABNORMAL HIGH (ref 11.5–15.5)
WBC: 9.9 10*3/uL (ref 4.0–10.5)

## 2016-12-05 LAB — COMPREHENSIVE METABOLIC PANEL
ALBUMIN: 4 g/dL (ref 3.5–5.0)
ALK PHOS: 81 U/L (ref 38–126)
ALT: 31 U/L (ref 17–63)
AST: 32 U/L (ref 15–41)
Anion gap: 8 (ref 5–15)
BUN: 8 mg/dL (ref 6–20)
CALCIUM: 9.3 mg/dL (ref 8.9–10.3)
CO2: 27 mmol/L (ref 22–32)
CREATININE: 0.72 mg/dL (ref 0.61–1.24)
Chloride: 105 mmol/L (ref 101–111)
GFR calc Af Amer: >60 mL/min (ref >60)
GFR calc non Af Amer: >60 mL/min (ref >60)
GLUCOSE: 114 mg/dL — AB (ref 65–99)
Potassium: 4.4 mmol/L (ref 3.5–5.1)
SODIUM: 140 mmol/L (ref 135–145)
Total Bilirubin: 0.3 mg/dL (ref 0.3–1.2)
Total Protein: 7.6 g/dL (ref 6.5–8.1)

## 2016-12-05 LAB — URINALYSIS, ROUTINE W REFLEX MICROSCOPIC
BILIRUBIN URINE: NEGATIVE
Glucose, UA: NEGATIVE mg/dL
Hgb urine dipstick: NEGATIVE
Ketones, ur: NEGATIVE mg/dL
Leukocytes, UA: NEGATIVE
NITRITE: NEGATIVE
PH: 7 (ref 5.0–8.0)
Protein, ur: NEGATIVE mg/dL
SPECIFIC GRAVITY, URINE: 1.002 — AB (ref 1.005–1.030)

## 2016-12-05 LAB — RAPID URINE DRUG SCREEN, HOSP PERFORMED
Amphetamines: NOT DETECTED
Barbiturates: NOT DETECTED
Benzodiazepines: NOT DETECTED
COCAINE: NOT DETECTED
OPIATES: POSITIVE — AB
Tetrahydrocannabinol: NOT DETECTED

## 2016-12-05 LAB — ETHANOL: ALCOHOL ETHYL (B): 96 mg/dL — AB (ref <5)

## 2016-12-05 LAB — LIPASE, BLOOD: Lipase: 344 U/L — ABNORMAL HIGH (ref 11–51)

## 2016-12-05 MED ORDER — THIAMINE HCL 100 MG/ML IJ SOLN: 100.0000 mg | Freq: Every day | INTRAMUSCULAR | Status: DC

## 2016-12-05 MED ORDER — ACETAMINOPHEN 650 MG RE SUPP: 650.0000 mg | Freq: Four times a day (QID) | RECTAL | Status: DC | PRN

## 2016-12-05 MED ORDER — FOLIC ACID 1 MG PO TABS
1.0000 mg | ORAL_TABLET | Freq: Every day | ORAL | Status: DC
  Administered 2016-12-05 – 2016-12-06 (×2): 1 mg via ORAL
  Filled 2016-12-05 (×2): qty 1

## 2016-12-05 MED ORDER — SODIUM CHLORIDE 0.9 % IV SOLN
INTRAVENOUS | Status: AC
  Administered 2016-12-05 – 2016-12-06 (×2): via INTRAVENOUS

## 2016-12-05 MED ORDER — ONDANSETRON HCL 4 MG/2ML IJ SOLN: 4.0000 mg | Freq: Four times a day (QID) | INTRAMUSCULAR | Status: DC | PRN

## 2016-12-05 MED ORDER — GI COCKTAIL ~~LOC~~
30.0000 mL | Freq: Three times a day (TID) | ORAL | Status: DC | PRN
  Administered 2016-12-05 – 2016-12-06 (×2): 30 mL via ORAL
  Filled 2016-12-05 (×2): qty 30

## 2016-12-05 MED ORDER — MORPHINE SULFATE (PF) 2 MG/ML IV SOLN
2.0000 mg | Freq: Once | INTRAVENOUS | Status: AC
  Administered 2016-12-05: 2 mg via INTRAVENOUS
  Filled 2016-12-05: qty 1

## 2016-12-05 MED ORDER — LORAZEPAM 2 MG/ML IJ SOLN: 0.0000 mg | Freq: Two times a day (BID) | INTRAMUSCULAR | Status: DC

## 2016-12-05 MED ORDER — ONDANSETRON 4 MG PO TBDP
4.0000 mg | ORAL_TABLET | Freq: Once | ORAL | Status: AC | PRN
  Administered 2016-12-05: 4 mg via ORAL
  Filled 2016-12-05: qty 1

## 2016-12-05 MED ORDER — FAMOTIDINE IN NACL 20-0.9 MG/50ML-% IV SOLN
20.0000 mg | Freq: Two times a day (BID) | INTRAVENOUS | Status: DC
  Administered 2016-12-05 – 2016-12-06 (×2): 20 mg via INTRAVENOUS
  Filled 2016-12-05 (×3): qty 50

## 2016-12-05 MED ORDER — CHLORHEXIDINE GLUCONATE 0.12 % MT SOLN
15.0000 mL | Freq: Two times a day (BID) | OROMUCOSAL | Status: DC
  Administered 2016-12-05 – 2016-12-06 (×2): 15 mL via OROMUCOSAL
  Filled 2016-12-05 (×2): qty 15

## 2016-12-05 MED ORDER — ENOXAPARIN SODIUM 40 MG/0.4ML ~~LOC~~ SOLN
40.0000 mg | SUBCUTANEOUS | Status: DC
  Administered 2016-12-05: 40 mg via SUBCUTANEOUS
  Filled 2016-12-05: qty 0.4

## 2016-12-05 MED ORDER — SODIUM CHLORIDE 0.9 % IV BOLUS (SEPSIS)
1000.0000 mL | Freq: Once | INTRAVENOUS | Status: AC
  Administered 2016-12-05: 1000 mL via INTRAVENOUS

## 2016-12-05 MED ORDER — ADULT MULTIVITAMIN W/MINERALS CH
1.0000 | ORAL_TABLET | Freq: Every day | ORAL | Status: DC
  Administered 2016-12-05 – 2016-12-06 (×2): 1 via ORAL
  Filled 2016-12-05 (×2): qty 1

## 2016-12-05 MED ORDER — LORAZEPAM 2 MG/ML IJ SOLN
0.0000 mg | Freq: Four times a day (QID) | INTRAMUSCULAR | Status: DC
Start: 2016-12-05 — End: 2016-12-06
  Administered 2016-12-05: 1 mg via INTRAVENOUS
  Administered 2016-12-06: 4 mg via INTRAVENOUS
  Administered 2016-12-06 (×2): 1 mg via INTRAVENOUS
  Filled 2016-12-05: qty 2
  Filled 2016-12-05 (×3): qty 1

## 2016-12-05 MED ORDER — VITAMIN B-1 100 MG PO TABS
100.0000 mg | ORAL_TABLET | Freq: Every day | ORAL | Status: DC
  Administered 2016-12-05 – 2016-12-06 (×2): 100 mg via ORAL
  Filled 2016-12-05 (×2): qty 1

## 2016-12-05 MED ORDER — PANCRELIPASE (LIP-PROT-AMYL) 12000-38000 UNITS PO CPEP
24000.0000 [IU] | ORAL_CAPSULE | Freq: Three times a day (TID) | ORAL | Status: DC
  Administered 2016-12-06 (×2): 24000 [IU] via ORAL
  Filled 2016-12-05 (×2): qty 2

## 2016-12-05 MED ORDER — ACETAMINOPHEN 325 MG PO TABS: 650.0000 mg | ORAL_TABLET | Freq: Four times a day (QID) | ORAL | Status: DC | PRN

## 2016-12-05 MED ORDER — LORAZEPAM 1 MG PO TABS
1.0000 mg | ORAL_TABLET | Freq: Four times a day (QID) | ORAL | Status: DC | PRN
  Administered 2016-12-06: 1 mg via ORAL
  Filled 2016-12-05: qty 1

## 2016-12-05 MED ORDER — MORPHINE SULFATE (PF) 2 MG/ML IV SOLN
1.0000 mg | INTRAVENOUS | Status: DC | PRN
  Administered 2016-12-05: 3 mg via INTRAVENOUS
  Administered 2016-12-05: 2 mg via INTRAVENOUS
  Administered 2016-12-06 (×4): 3 mg via INTRAVENOUS
  Filled 2016-12-05 (×4): qty 2
  Filled 2016-12-05: qty 1
  Filled 2016-12-05: qty 2

## 2016-12-05 MED ORDER — ORAL CARE MOUTH RINSE: 15.0000 mL | Freq: Two times a day (BID) | OROMUCOSAL | Status: DC

## 2016-12-05 MED ORDER — PROMETHAZINE HCL 25 MG/ML IJ SOLN
12.5000 mg | Freq: Four times a day (QID) | INTRAMUSCULAR | Status: DC | PRN
  Filled 2016-12-05: qty 1

## 2016-12-05 MED ORDER — MORPHINE SULFATE (PF) 2 MG/ML IV SOLN
4.0000 mg | Freq: Once | INTRAVENOUS | Status: AC
  Administered 2016-12-05: 4 mg via INTRAVENOUS
  Filled 2016-12-05: qty 2

## 2016-12-05 MED ORDER — GI COCKTAIL ~~LOC~~
30.0000 mL | Freq: Once | ORAL | Status: AC
  Administered 2016-12-05: 30 mL via ORAL
  Filled 2016-12-05: qty 30

## 2016-12-05 MED ORDER — ONDANSETRON HCL 4 MG/2ML IJ SOLN
4.0000 mg | Freq: Once | INTRAMUSCULAR | Status: AC
  Administered 2016-12-05: 4 mg via INTRAVENOUS
  Filled 2016-12-05: qty 2

## 2016-12-05 MED ORDER — ONDANSETRON HCL 4 MG PO TABS: 4.0000 mg | ORAL_TABLET | Freq: Four times a day (QID) | ORAL | Status: DC | PRN

## 2016-12-05 MED ORDER — LORAZEPAM 2 MG/ML IJ SOLN: 1.0000 mg | Freq: Four times a day (QID) | INTRAMUSCULAR | Status: DC | PRN

## 2016-12-05 NOTE — H&P (Signed)
History and Physical    [name] URK:[id number] DOB: [birth year] DOA: 12/05/2016  PCP: Patient, No Pcp Per   Patient coming from: Home  Chief Complaint: Epigastric pain, N/V   HPI: [name] is a 46 y.o. male with medical history significant for alcohol abuse and chronic pancreatitis, now presenting to the emergency department for evaluation of epigastric pain with nausea and vomiting. Patient reports a recent hospital admission for the same, discharged on 11/28/2016 after management for acute on chronic pancreatitis. He reports doing well since the recent hospital discharge until 2 nights ago when he noted the insidious development of epigastric pain and nausea with nonbloody nonbilious vomiting. This persisted and he was evaluated in the emergency department yesterday for this with a CT consistent with chronic pancreatitis, but no acute inflammation. Lipase was elevated to 355 in the ED yesterday. He was stable and discharged home with antiemetics, but has had persistent nausea with vomiting and has been unable to tolerate any oral intake, unable to tolerate the antiemetic due to persistent vomiting. He denies fevers or chills, reports loose stools, but denies melena or hematochezia. He reports continued alcohol use with 7 drink so far today.  ED Course: Upon arrival to the ED, patient is found to be afebrile, saturating well on room air, with vitals otherwise stable. Chemistry panel is unremarkable and lipase is elevated to 344. CBC is notable for a stable normocytic anemia with hemoglobin of 10.9. Urinalysis features a low specific gravity but is otherwise unremarkable. Ethanol level returned elevated to 96. Patient was treated with GI cocktail, 4 mg morphine, Zofran, and 1 L of normal saline. He remained hemodynamically stable and in no apparent respiratory distress, and will be observed on the medical surgical unit for ongoing evaluation and management of epigastric pain with nausea and  vomiting.   Review of Systems:  All other systems reviewed and apart from HPI, are negative.  Past Medical History:  Diagnosis Date  . Hypertension   . Pancreatitis     History reviewed. No pertinent surgical history.   reports that he has never smoked. He has never used smokeless tobacco. He reports that he drinks alcohol. He reports that he does not use drugs.  Allergies  Allergen Reactions  . Contrast Media [Iodinated Diagnostic Agents] Hives    History reviewed. No pertinent family history.   Prior to Admission medications   Medication Sig Start Date End Date Taking? Authorizing Provider  Alum & Mag Hydroxide-Simeth (GI COCKTAIL) SUSP suspension Take 30 mLs by mouth 2 (two) times daily. Shake well.    [provider]  diphenhydrAMINE (BENADRYL) 25 mg capsule Take 25 mg by mouth every 6 (six) hours as needed.    [provider]  ibuprofen (ADVIL,MOTRIN) 200 MG tablet Take 400 mg by mouth every 6 (six) hours as needed.    [provider]  ondansetron (ZOFRAN) 4 MG tablet Take 4 mg by mouth every 8 (eight) hours as needed for nausea or vomiting.    [provider]  Pancrelipase, Lip-Prot-Amyl, (CREON) 24000-76000 units CPEP Take 24,000 Units by mouth 3 (three) times daily.    [provider]    Physical Exam: Vitals:   12/05/16 0952 12/05/16 1411  BP: 120/77 125/85  Pulse: 78 84  Resp: 18 18  Temp: (!) 97.5 F (36.4 C)   TempSrc: Oral   SpO2: 100% 99%  Weight: 68 kg (150 lb)   Height: 5\' 9"  (1.753 m)       Constitutional:  NAD, calm, in apparent discomfort Eyes: PERTLA, lids and conjunctivae normal ENMT: Mucous membranes are moist. Posterior pharynx clear of any exudate or lesions.   Neck: normal, supple, no masses, no thyromegaly Respiratory: clear to auscultation bilaterally, no wheezing, no crackles. Normal respiratory effort. Cardiovascular: S1 & S2 heard, regular rate and rhythm. No extremity edema. No significant  JVD. Abdomen: No distension, tender in epigastrium without rebound pain or guarding. Bowel sounds active.  Musculoskeletal: no clubbing / cyanosis. No joint deformity upper and lower extremities.   Skin: no significant rashes, lesions, ulcers. Warm, dry, well-perfused. Neurologic: CN 2-12 grossly intact. Sensation intact, DTR normal. Strength 5/5 in all 4 limbs.  Psychiatric: Alert and oriented x 3. Calm and cooperative.     Labs on Admission: I have personally reviewed following labs and imaging studies  CBC:  Recent Labs Lab 12/04/16 0230 12/05/16 1021  WBC 10.6* 9.9  HGB 10.4* 10.9*  HCT 31.6* 33.2*  MCV 88.3 86.5  PLT 369 132   Basic Metabolic Panel:  Recent Labs Lab 12/04/16 0230 12/05/16 1021  NA 138 140  K 4.1 4.4  CL 103 105  CO2 27 27  GLUCOSE 99 114*  BUN 11 8  CREATININE 1.13 0.72  CALCIUM 8.8* 9.3   GFR: Estimated Creatinine Clearance: 109.8 mL/min (by C-G formula based on SCr of 0.72 mg/dL). Liver Function Tests:  Recent Labs Lab 12/04/16 0230 12/05/16 1021  AST 33 32  ALT 29 31  ALKPHOS 78 81  BILITOT 0.2* 0.3  PROT 7.6 7.6  ALBUMIN 3.7 4.0    Recent Labs Lab 12/04/16 0230 12/05/16 1021  LIPASE 355* 344*   No results for input(s): AMMONIA in the last 168 hours. Coagulation Profile: No results for input(s): INR, PROTIME in the last 168 hours. Cardiac Enzymes: No results for input(s): CKTOTAL, CKMB, CKMBINDEX, TROPONINI in the last 168 hours. BNP (last 3 results) No results for input(s): PROBNP in the last 8760 hours. HbA1C: No results for input(s): HGBA1C in the last 72 hours. CBG: No results for input(s): GLUCAP in the last 168 hours. Lipid Profile: No results for input(s): CHOL, HDL, LDLCALC, TRIG, CHOLHDL, LDLDIRECT in the last 72 hours. Thyroid Function Tests: No results for input(s): TSH, T4TOTAL, FREET4, T3FREE, THYROIDAB in the last 72 hours. Anemia Panel: No results for input(s): VITAMINB12, FOLATE, FERRITIN, TIBC, IRON,  RETICCTPCT in the last 72 hours. Urine analysis:    Component Value Date/Time   COLORURINE COLORLESS (A) 12/05/2016 1011   APPEARANCEUR CLEAR 12/05/2016 1011   LABSPEC 1.002 (L) 12/05/2016 1011   PHURINE 7.0 12/05/2016 [place] 12/05/2016 [place] 12/05/2016 [place] 12/05/2016 [place] 12/05/2016 1011   PROTEINUR NEGATIVE 12/05/2016 1011   NITRITE NEGATIVE 12/05/2016 [place] 12/05/2016 1011   Sepsis Labs: @LABRCNTIP (procalcitonin:4,lacticidven:4) )No results found for this or any previous visit (from the past 240 hour(s)).   Radiological Exams on Admission: Ct Abdomen Pelvis W Contrast  Result Date: 12/04/2016 CLINICAL DATA:  History of pancreatitis. Abdominal pain, nausea, vomiting EXAM: CT ABDOMEN AND PELVIS WITH CONTRAST TECHNIQUE: Multidetector CT imaging of the abdomen and pelvis was performed using the standard protocol following bolus administration of intravenous contrast. CONTRAST:  169mL ISOVUE-300 IOPAMIDOL (ISOVUE-300) INJECTION 61% COMPARISON:  None. FINDINGS: Lower chest: Lung bases are clear. No effusions. Heart is normal size. Hepatobiliary: No focal hepatic abnormality. Gallbladder unremarkable. Pancreas: Calcifications throughout the pancreas compatible with chronic pancreatitis. Cystic areas also  throughout the pancreas. 12 mm cystic area in the pancreatic head. 2.7 cm cystic area in the upper body. 10 mm cystic area in the tail of. Mild ductal dilatation. No surrounding stranding/inflammation. Spleen: No focal abnormality.  Normal size. Adrenals/Urinary Tract: No adrenal abnormality. No focal renal abnormality. No stones or hydronephrosis. Urinary bladder is unremarkable. Stomach/Bowel: Moderate stool in the colon. Stomach, large and small bowel grossly unremarkable. Vascular/Lymphatic: Aortic and iliac calcifications. No aneurysm or adenopathy. Reproductive: No visible focal abnormality.  Other: No free fluid or free air. Musculoskeletal: No acute bony abnormality. IMPRESSION: Changes of chronic calcific pancreatitis. There is associated ductal dilatation and 3 cystic areas, the largest in the body measuring 2.7 cm, likely pseudocysts. No surrounding inflammation to suggest superimposed acute pancreatitis. Aortoiliac atherosclerosis. Electronically Signed   By: Rolm Baptise M.D.   On: 12/04/2016 07:38    EKG: Not performed.   Assessment/Plan  1. Intractable nausea and vomiting  - Pt presents with persistent N/V, unable to tolerate Zofran ODT at home - CT with chronic pancreatitis, but no acute inflammation and no obstruction  - Plan to continue IVF, prn antiemetics, and advance diet once N/V controlled    2. Chronic pancreatitis  - Alcohol-induced  - Lipase is elevated to 344 on admission - CT without acute inflammation, but findings consistent with chronic pancreatitis - Continue Creon once tolerating a diet   3. Alcohol dependence, acute intoxication   - Pt has EtOH level 96 on admission - He was in rehab for this in May '18, reportedly abstained for ~6 wks before relapse  - Monitor with CIWA and prn Ativan, supplement vitamins and minerals   4. Anemia  - Hgb is 10.9 on admission - Stable with no bleeding evident  - Likely d/t chronic disease    DVT prophylaxis: sq Lovenox  Code Status: Full  Family Communication: Discussed with patient Disposition Plan: Observe on med-surg Consults called: None Admission status: Observation    Vianne Bulls, MD [facility] Pager [phone]  If 7PM-7AM, please contact night-coverage [email or web address] Password [email or web address]  12/05/2016, 4:17 PM

## 2016-12-05 NOTE — ED Notes (Addendum)
When out in lobby calling other patient's to take back to treatment rooms, patient came up to me from outside. I asked patient where he had been because we had tried calling his name he responds, "I just walked outside".   I informed patient that I called his name in lobby as well as, went outside looking for him without any answer.   Patient speaks loudly, "You a fucking liar, you didn't go outside! You didn't go no god damn oustide!" Registration as well as other people in lobby verified to patient that this staff member did call him and went outside looking for him. Staff notified security and off duty [employer] who came and spoke with patient about way he communicating with staff.
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
## 2016-12-05 NOTE — ED Notes (Signed)
Pt given urinal for urine sample and states that he will call when he has obtained one.

## 2016-12-05 NOTE — ED Notes (Signed)
Called for room assignment with no answer 

## 2016-12-05 NOTE — ED Triage Notes (Addendum)
Per [facility] pt from home for abd pain, n/v/d that started yesterday. Patient has pancreatitis and was seen here yesterday for same symptoms but can't keep nausea and GI medications down. Patient reports that lipase level was elevated yesterday

## 2016-12-06 ENCOUNTER — Emergency Department (HOSPITAL_COMMUNITY)
Admission: EM | Admit: 2016-12-06 | Discharge: 2016-12-06 | Discharge status: Absent without leave | Payer: No Typology Code available for payment source

## 2016-12-06 ENCOUNTER — Encounter (HOSPITAL_COMMUNITY): Payer: Self-pay | Admitting: Emergency Medicine

## 2016-12-06 ENCOUNTER — Emergency Department (HOSPITAL_COMMUNITY)
Admission: EM | Admit: 2016-12-06 | Discharge: 2016-12-06 | Payer: Self-pay | Attending: Emergency Medicine | Admitting: Emergency Medicine

## 2016-12-06 DIAGNOSIS — R109 Unspecified abdominal pain: Secondary | ICD-10-CM | POA: Insufficient documentation

## 2016-12-06 DIAGNOSIS — R110 Nausea: Secondary | ICD-10-CM | POA: Insufficient documentation

## 2016-12-06 DIAGNOSIS — F10129 Alcohol abuse with intoxication, unspecified: Secondary | ICD-10-CM

## 2016-12-06 DIAGNOSIS — R112 Nausea with vomiting, unspecified: Secondary | ICD-10-CM

## 2016-12-06 DIAGNOSIS — K852 Alcohol induced acute pancreatitis without necrosis or infection: Secondary | ICD-10-CM

## 2016-12-06 DIAGNOSIS — Z5321 Procedure and treatment not carried out due to patient leaving prior to being seen by health care provider: Secondary | ICD-10-CM | POA: Insufficient documentation

## 2016-12-06 LAB — BASIC METABOLIC PANEL
Anion gap: 8 (ref 5–15)
BUN: 6 mg/dL (ref 6–20)
CO2: 25 mmol/L (ref 22–32)
Calcium: 8.5 mg/dL — ABNORMAL LOW (ref 8.9–10.3)
Chloride: 108 mmol/L (ref 101–111)
Creatinine, Ser: 0.7 mg/dL (ref 0.61–1.24)
GFR calc Af Amer: >60 mL/min (ref >60)
GLUCOSE: 89 mg/dL (ref 65–99)
POTASSIUM: 3.8 mmol/L (ref 3.5–5.1)
Sodium: 141 mmol/L (ref 135–145)

## 2016-12-06 LAB — CBC
HEMATOCRIT: 28.5 % — AB (ref 39.0–52.0)
HEMOGLOBIN: 9.5 g/dL — AB (ref 13.0–17.0)
MCH: 28.9 pg (ref 26.0–34.0)
MCHC: 33.3 g/dL (ref 30.0–36.0)
MCV: 86.6 fL (ref 78.0–100.0)
Platelets: 320 10*3/uL (ref 150–400)
RBC: 3.29 MIL/uL — AB (ref 4.22–5.81)
RDW: 18.3 % — ABNORMAL HIGH (ref 11.5–15.5)
WBC: 7.5 10*3/uL (ref 4.0–10.5)

## 2016-12-06 LAB — HIV ANTIBODY (ROUTINE TESTING W REFLEX): HIV SCREEN 4TH GENERATION: NONREACTIVE

## 2016-12-06 LAB — GLUCOSE, CAPILLARY: Glucose-Capillary: 107 mg/dL — ABNORMAL HIGH (ref 65–99)

## 2016-12-06 MED ORDER — SODIUM CHLORIDE 0.9 % IV SOLN
INTRAVENOUS | Status: DC
  Administered 2016-12-06: 12:00:00 via INTRAVENOUS

## 2016-12-06 MED ORDER — BOOST / RESOURCE BREEZE PO LIQD
1.0000 | Freq: Three times a day (TID) | ORAL | Status: DC
  Administered 2016-12-06: 1 via ORAL
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
## 2016-12-06 NOTE — Discharge Summary (Signed)
Physician Discharge Summary  [name] TMH:[id number] DOB: Oct 27, 1970 DOA: 12/05/2016  PCP: Patient, No Pcp Per  Admit date: 12/05/2016 Discharge date: 12/06/2016  Admitted From: home Disposition:  Left AGAINST MEDICAL ADVICE  HPI: Per Dr. Myna Hidalgo, [name] is a 46 y.o. male with medical history significant for alcohol abuse and chronic pancreatitis, now presenting to the emergency department for evaluation of epigastric pain with nausea and vomiting. Patient reports a recent hospital admission for the same, discharged on 11/28/2016 after management for acute on chronic pancreatitis. He reports doing well since the recent hospital discharge until 2 nights ago when he noted the insidious development of epigastric pain and nausea with nonbloody nonbilious vomiting. This persisted and he was evaluated in the emergency department yesterday for this with a CT consistent with chronic pancreatitis, but no acute inflammation. Lipase was elevated to 355 in the ED yesterday. He was stable and discharged home with antiemetics, but has had persistent nausea with vomiting and has been unable to tolerate any oral intake, unable to tolerate the antiemetic due to persistent vomiting. He denies fevers or chills, reports loose stools, but denies melena or hematochezia. He reports continued alcohol use with 7 drink so far today. ED Course: Upon arrival to the ED, patient is found to be afebrile, saturating well on room air, with vitals otherwise stable. Chemistry panel is unremarkable and lipase is elevated to 344. CBC is notable for a stable normocytic anemia with hemoglobin of 10.9. Urinalysis features a low specific gravity but is otherwise unremarkable. Ethanol level returned elevated to 96. Patient was treated with GI cocktail, 4 mg morphine, Zofran, and 1 L of normal saline. He remained hemodynamically stable and in no apparent respiratory distress, and will be observed on the medical surgical unit for ongoing  evaluation and management of epigastric pain with nausea and vomiting.   Hospital Course: Discharge Diagnoses:  Principal Problem:   Intractable nausea and vomiting Active Problems:   Chronic alcoholic pancreatitis (HCC)   Alcohol dependence ([place])   Verbally abusive behavior   Normocytic anemia   Alcohol abuse with intoxication ([place])  Acute on chronic pancreatitis  -Lipase elevated to 300 on admission, CT without acute inflammation however. Patient was treated with IVF, pain control and NPO. He was starting to improve and his diet was advanced to clear >> fulls, tolerating it without difficulties.It appears that patient left AGAINST MEDICAL ADVICE after this MD went off shift. Throughout his stay he exhibited narcotic seeking behavior persistently asking for IV pain medications despite being able to tolerate po intake without difficulties. There were concerns from overnight nursing staff that patient might have additional personal medications/substances in his possession while in the hospital.  Alcohol abuse - causing his pancreatitis Normocytic anemia -Somewhat dilutional from fluids. He did however had reported hematuria from passing a stone --- per patient. [place] was ordered however patient left before it could be collected.   Discharge Instructions   Allergies as of 12/06/2016      Reactions   Contrast Media [iodinated Diagnostic Agents] Hives      Medication List    ASK your doctor about these medications   CREON 24000-76000 units Cpep Generic drug:  Pancrelipase (Lip-Prot-Amyl) Take 24,000 Units by mouth 3 (three) times daily.   diphenhydrAMINE 25 mg capsule Commonly known as:  BENADRYL Take 25 mg by mouth every 6 (six) hours as needed.   gi cocktail Susp suspension Take 30 mLs by mouth 2 (two) times daily. Shake well.  ibuprofen 200 MG tablet Commonly known as:  ADVIL,MOTRIN Take 400 mg by mouth every 6 (six) hours as needed.   ondansetron 4 MG tablet Commonly known  as:  ZOFRAN Take 4 mg by mouth every 8 (eight) hours as needed for nausea or vomiting.       Allergies  Allergen Reactions  . Contrast Media [Iodinated Diagnostic Agents] Hives    Ct Abdomen Pelvis W Contrast  Result Date: 12/04/2016 CLINICAL DATA:  History of pancreatitis. Abdominal pain, nausea, vomiting EXAM: CT ABDOMEN AND PELVIS WITH CONTRAST TECHNIQUE: Multidetector CT imaging of the abdomen and pelvis was performed using the standard protocol following bolus administration of intravenous contrast. CONTRAST:  131mL ISOVUE-300 IOPAMIDOL (ISOVUE-300) INJECTION 61% COMPARISON:  None. FINDINGS: Lower chest: Lung bases are clear. No effusions. Heart is normal size. Hepatobiliary: No focal hepatic abnormality. Gallbladder unremarkable. Pancreas: Calcifications throughout the pancreas compatible with chronic pancreatitis. Cystic areas also throughout the pancreas. 12 mm cystic area in the pancreatic head. 2.7 cm cystic area in the upper body. 10 mm cystic area in the tail of. Mild ductal dilatation. No surrounding stranding/inflammation. Spleen: No focal abnormality.  Normal size. Adrenals/Urinary Tract: No adrenal abnormality. No focal renal abnormality. No stones or hydronephrosis. Urinary bladder is unremarkable. Stomach/Bowel: Moderate stool in the colon. Stomach, large and small bowel grossly unremarkable. Vascular/Lymphatic: Aortic and iliac calcifications. No aneurysm or adenopathy. Reproductive: No visible focal abnormality. Other: No free fluid or free air. Musculoskeletal: No acute bony abnormality. IMPRESSION: Changes of chronic calcific pancreatitis. There is associated ductal dilatation and 3 cystic areas, the largest in the body measuring 2.7 cm, likely pseudocysts. No surrounding inflammation to suggest superimposed acute pancreatitis. Aortoiliac atherosclerosis. Electronically Signed   By: Rolm Baptise M.D.   On: 12/04/2016 07:38       The results of significant diagnostics from  this hospitalization (including imaging, microbiology, ancillary and laboratory) are listed below for reference.     Microbiology: No results found for this or any previous visit (from the past 240 hour(s)).   Labs: BNP (last 3 results) No results for input(s): BNP in the last 8760 hours. Basic Metabolic Panel:  Recent Labs Lab 12/04/16 0230 12/05/16 1021 12/06/16 0455  NA 138 140 141  K 4.1 4.4 3.8  CL 103 105 108  CO2 27 27 25   GLUCOSE 99 114* 89  BUN 11 8 6   CREATININE 1.13 0.72 0.70  CALCIUM 8.8* 9.3 8.5*   Liver Function Tests:  Recent Labs Lab 12/04/16 0230 12/05/16 1021  AST 33 32  ALT 29 31  ALKPHOS 78 81  BILITOT 0.2* 0.3  PROT 7.6 7.6  ALBUMIN 3.7 4.0    Recent Labs Lab 12/04/16 0230 12/05/16 1021  LIPASE 355* 344*   No results for input(s): AMMONIA in the last 168 hours. CBC:  Recent Labs Lab 12/04/16 0230 12/05/16 1021 12/06/16 0455  WBC 10.6* 9.9 7.5  HGB 10.4* 10.9* 9.5*  HCT 31.6* 33.2* 28.5*  MCV 88.3 86.5 86.6  PLT 369 385 320   Cardiac Enzymes: No results for input(s): CKTOTAL, CKMB, CKMBINDEX, TROPONINI in the last 168 hours. BNP: Invalid input(s): POCBNP CBG:  Recent Labs Lab 12/06/16 0812  GLUCAP 107*   D-Dimer No results for input(s): DDIMER in the last 72 hours. Hgb A1c No results for input(s): HGBA1C in the last 72 hours. Lipid Profile No results for input(s): CHOL, HDL, LDLCALC, TRIG, CHOLHDL, LDLDIRECT in the last 72 hours. Thyroid function studies No results for input(s): TSH, T4TOTAL, T3FREE,  THYROIDAB in the last 72 hours.  Invalid input(s): FREET3 Anemia work up No results for input(s): VITAMINB12, FOLATE, FERRITIN, TIBC, IRON, RETICCTPCT in the last 72 hours. Urinalysis    Component Value Date/Time   COLORURINE COLORLESS (A) 12/05/2016 1011   APPEARANCEUR CLEAR 12/05/2016 1011   LABSPEC 1.002 (L) 12/05/2016 1011   PHURINE 7.0 12/05/2016 1011   GLUCOSEU NEGATIVE 12/05/2016 1011   HGBUR NEGATIVE  12/05/2016 [place] 12/05/2016 1011   KETONESUR NEGATIVE 12/05/2016 1011   PROTEINUR NEGATIVE 12/05/2016 1011   NITRITE NEGATIVE 12/05/2016 [place] 12/05/2016 1011   Sepsis Labs Invalid input(s): PROCALCITONIN,  WBC,  LACTICIDVEN Microbiology No results found for this or any previous visit (from the past 240 hour(s)).   SIGNED:  Marzetta Board, MD  [facility] 12/06/2016, 10:54 PM Pager [phone]  If 7PM-7AM, please contact night-coverage [email or web address] Password [email or web address]

## 2016-12-06 NOTE — Progress Notes (Signed)
PROGRESS NOTE  [name] [id number]:[id number] DOB: 1970/11/05 DOA: 12/05/2016 PCP: Patient, No Pcp Per   LOS: 0 days   Brief Narrative / Interim history: 46 year old male with medical history significant for alcohol abuse and chronic pancreatitis, now presenting to the emergency department for evaluation of epigastric pain with nausea and vomiting.  Found to have acute on chronic pancreatitis in the setting of ongoing alcohol abuse  Assessment & Plan: Principal Problem:   Intractable nausea and vomiting Active Problems:   Chronic alcoholic pancreatitis (HCC)   Alcohol dependence ([place])   Verbally abusive behavior   Normocytic anemia   Alcohol abuse with intoxication ([place])   Acute on chronic pancreatitis  -Lipase elevated to 300 on admission, CT without acute inflammation -Continue IV fluids, pain control, advance diet, he is tolerating a clear liquid diet was advanced to full liquid tonight, if he is able to eat soft in the morning may be able to go home  Alcohol dependence -Patient intoxicated in the ED, counseled for cessation  Normocytic anemia -Somewhat delusional from fluids this morning   DVT prophylaxis: Lovenox Code Status: Full code Family Communication: No family at bedside Disposition Plan: Home when ready  Consultants:   None  Procedures:   none  Antimicrobials:  None    Subjective: -Complains of abdominal pain, no nausea or vomiting he is hungry.  Objective: Vitals:   12/05/16 2227 12/05/16 2354 12/06/16 0509 12/06/16 1400  BP: 125/80 106/68 (!) 126/91 (!) 154/93  Pulse: 77 80 73 65  Resp: 18 16 16 16   Temp: 98.1 F (36.7 C) 98.2 F (36.8 C) 97.9 F (36.6 C) 98 F (36.7 C)  TempSrc: Oral Oral Oral Oral  SpO2: 100% 100% 100% 100%  Weight:   60.3 kg (132 lb 14.4 oz)   Height:        Intake/Output Summary (Last 24 hours) at 12/06/16 1713 Last data filed at 12/06/16 1401  Gross per 24 hour  Intake           1357.5 ml  Output                 0 ml  Net           1357.5 ml   Filed Weights   12/05/16 0952 12/05/16 1749 12/06/16 0509  Weight: 68 kg (150 lb) 62.7 kg (138 lb 4.8 oz) 60.3 kg (132 lb 14.4 oz)    Examination:  Vitals:   12/05/16 2227 12/05/16 2354 12/06/16 0509 12/06/16 1400  BP: 125/80 106/68 (!) 126/91 (!) 154/93  Pulse: 77 80 73 65  Resp: 18 16 16 16   Temp: 98.1 F (36.7 C) 98.2 F (36.8 C) 97.9 F (36.6 C) 98 F (36.7 C)  TempSrc: Oral Oral Oral Oral  SpO2: 100% 100% 100% 100%  Weight:   60.3 kg (132 lb 14.4 oz)   Height:        Constitutional: NAD Eyes: lids and conjunctivae normal ENMT: Mucous membranes are moist. Respiratory: clear to auscultation bilaterally, no wheezing, no crackles. Cardiovascular: Regular rate and rhythm, no murmurs / rubs / gallops. No LE edema.  Abdomen: epigastric tenderness. Bowel sounds positive.    Data Reviewed: I have independently reviewed following labs and imaging studies   CBC:  Recent Labs Lab 12/04/16 0230 12/05/16 1021 12/06/16 0455  WBC 10.6* 9.9 7.5  HGB 10.4* 10.9* 9.5*  HCT 31.6* 33.2* 28.5*  MCV 88.3 86.5 86.6  PLT 369 385 027   Basic Metabolic Panel:  Recent Labs  Lab 12/04/16 0230 12/05/16 1021 12/06/16 0455  NA 138 140 141  K 4.1 4.4 3.8  CL 103 105 108  CO2 27 27 25   GLUCOSE 99 114* 89  BUN 11 8 6   CREATININE 1.13 0.72 0.70  CALCIUM 8.8* 9.3 8.5*   GFR: Estimated Creatinine Clearance: 97.4 mL/min (by C-G formula based on SCr of 0.7 mg/dL). Liver Function Tests:  Recent Labs Lab 12/04/16 0230 12/05/16 1021  AST 33 32  ALT 29 31  ALKPHOS 78 81  BILITOT 0.2* 0.3  PROT 7.6 7.6  ALBUMIN 3.7 4.0    Recent Labs Lab 12/04/16 0230 12/05/16 1021  LIPASE 355* 344*   No results for input(s): AMMONIA in the last 168 hours. Coagulation Profile: No results for input(s): INR, PROTIME in the last 168 hours. Cardiac Enzymes: No results for input(s): CKTOTAL, CKMB, CKMBINDEX, TROPONINI in the last 168 hours. BNP (last 3  results) No results for input(s): PROBNP in the last 8760 hours. HbA1C: No results for input(s): HGBA1C in the last 72 hours. CBG:  Recent Labs Lab 12/06/16 0812  GLUCAP 107*   Lipid Profile: No results for input(s): CHOL, HDL, LDLCALC, TRIG, CHOLHDL, LDLDIRECT in the last 72 hours. Thyroid Function Tests: No results for input(s): TSH, T4TOTAL, FREET4, T3FREE, THYROIDAB in the last 72 hours. Anemia Panel: No results for input(s): VITAMINB12, FOLATE, FERRITIN, TIBC, IRON, RETICCTPCT in the last 72 hours. Urine analysis:    Component Value Date/Time   COLORURINE COLORLESS (A) 12/05/2016 1011   APPEARANCEUR CLEAR 12/05/2016 1011   LABSPEC 1.002 (L) 12/05/2016 1011   PHURINE 7.0 12/05/2016 1011   GLUCOSEU NEGATIVE 12/05/2016 1011   HGBUR NEGATIVE 12/05/2016 [place] 12/05/2016 1011   KETONESUR NEGATIVE 12/05/2016 1011   PROTEINUR NEGATIVE 12/05/2016 1011   NITRITE NEGATIVE 12/05/2016 [place] 12/05/2016 1011   Sepsis Labs: Invalid input(s): PROCALCITONIN, LACTICIDVEN  No results found for this or any previous visit (from the past 240 hour(s)).    Radiology Studies: No results found.   Scheduled Meds: . chlorhexidine  15 mL Mouth Rinse BID  . enoxaparin (LOVENOX) injection  40 mg Subcutaneous Q24H  . feeding supplement  1 Container Oral TID BM  . folic acid  1 mg Oral Daily  . lipase/protease/amylase  24,000 Units Oral TID WC  . LORazepam  0-4 mg Intravenous Q6H   Followed by  . [START ON 12/07/2016] LORazepam  0-4 mg Intravenous Q12H  . mouth rinse  15 mL Mouth Rinse q12n4p  . multivitamin with minerals  1 tablet Oral Daily  . thiamine  100 mg Oral Daily   Or  . thiamine  100 mg Intravenous Daily   Continuous Infusions: . sodium chloride 125 mL/hr at 12/06/16 1213  . famotidine (PEPCID) IV Stopped (12/06/16 1110)     Marzetta Board, MD, PhD [facility] Pager [phone]  If 7PM-7AM, please contact  night-coverage [email or web address] Password [email or web address] 12/06/2016, 5:13 PM

## 2016-12-06 NOTE — Progress Notes (Signed)
Patient stated he thinks he "passed a stone" in his sleep, woke up with small amount of blood in underwear. No pain and no urination during this experience. Lamar Blinks NP notified. Will continue to monitor.

## 2016-12-06 NOTE — Progress Notes (Signed)
Pt continued to be beligerent. NT confiscated ciggarettes and a lighter.Patient  referred to Probation officer as " F*@#king white Bi%#H"  And continues to insist that he has received no medications. Pt Disconnected his IV and got dressed. The pts wife and mother arrived and attempted to get the patient to stop behaving belligerently. The patient arrived at the nurses station fully dressed insisting that he has received no medication, and bhe was going to leave. The CN and Probation officer provided him with AMA papers which he refused to sign. Writer attempted to properly remove IV from his left forearm.Finally pt tore IV out of his own arm. CN offered to apply gauze and tape to bleeding site, and he refused, and began to shove whomever was close by. Writer, CN, and security escorted pt to elevator, and security escorted patient off campus. Staff attempted to reason with patient at all times prior to this particular event.

## 2016-12-06 NOTE — ED Triage Notes (Signed)
Patient arrives by EMS-states he left AMA from the 4th floor 30 minutes ago and ripped his IV out "because they wouldn't give me my medicine". Patient states he called 911 right after he left to come back for his abdominal pain and nausea.

## 2016-12-06 NOTE — Progress Notes (Signed)
Initial Nutrition Assessment  DOCUMENTATION CODES:   Not applicable  INTERVENTION:   Boost Breeze po TID, each supplement provides 250 kcal and 9 grams of protein  MVI Daily  NUTRITION DIAGNOSIS:   Inadequate oral intake related to altered GI function (pancreatitis) as evidenced by meal completion < 50%, other (see comment) (reports of nausea and vomiting).  GOAL:   Patient will meet greater than or equal to 90% of their needs  MONITOR:   PO intake, Supplement acceptance, Labs, Weight trends  REASON FOR ASSESSMENT:   Malnutrition Screening Tool  ASSESSMENT:   Pt with PMH of alcohol abuse, chronic pancreatitis, and HTN. Presents this admission with intractable n/v with acute alcoholic intoxication.   Attempted to speak with pt regarding nutrition history. Pt refused to speak.   Records indicate pt is consuming 10% of his clear liquid diet at this time. Unsure of intake PTA. Records limited in wt history, but suspect pt has experienced wt loss. Spoke with RN, who reports pt was seen in ED a week ago seen for worsen pancreatitis symptoms, pt was sent home with medication. Pt began to drink alcohol after ED release, which brought him back for this admission. Will continue to monitor intake, labs, and attempt to talk with him in the future.   Unable to complete Nutrition-Focused physical exam at this time.   Medications reviewed and include: folic acid, lipase, MVI, thiamine, NS @ 125 ml/hr Labs reviewed: Lipase 344 (H)  Diet Order:  Diet clear liquid Room service appropriate? Yes; Fluid consistency: Thin  Skin:  Reviewed, no issues  Last BM:  12/05/16  Height:   Ht Readings from Last 1 Encounters:  12/05/16 5\' 9"  (1.753 m)    Weight:   Wt Readings from Last 1 Encounters:  12/06/16 132 lb 14.4 oz (60.3 kg)    Ideal Body Weight:  72.7 kg  BMI:  Body mass index is 19.63 kg/m.  Estimated Nutritional Needs:   Kcal:  1800-2000 (30-33 kcal/kg)  Protein:  95-105  grams (1.5-1.7 g/kg)  Fluid:  > 1.8 L/day  EDUCATION NEEDS:   No education needs identified at this time  [place], LDN Clinical Nutrition Pager # - [phone]

## 2016-12-06 NOTE — Progress Notes (Signed)
Pt is being verbally abusive and claiming that writer did not administer prn morphine which was administered at 1624

## 2016-12-06 NOTE — ED Provider Notes (Signed)
We were made aware of the patient behaving uncooperative and bluntly, apparently urinating on the floor, exposing the genitalia to other individuals, and knocking over signage, breaking things. Patient was escorted from the facility by police   Carmin Muskrat, MD 12/06/16 2336

## 2016-12-13 ENCOUNTER — Encounter (HOSPITAL_COMMUNITY): Payer: Self-pay

## 2016-12-17 ENCOUNTER — Inpatient Hospital Stay (HOSPITAL_COMMUNITY)
Admission: EM | Admit: 2016-12-17 | Discharge: 2016-12-29 | DRG: 438 | Disposition: A | Discharge status: Home / Self Care | Payer: Self-pay | Attending: Internal Medicine | Admitting: Internal Medicine

## 2016-12-17 ENCOUNTER — Encounter (HOSPITAL_COMMUNITY): Payer: Self-pay

## 2016-12-17 DIAGNOSIS — E43 Unspecified severe protein-calorie malnutrition: Secondary | ICD-10-CM | POA: Diagnosis present

## 2016-12-17 DIAGNOSIS — Z79899 Other long term (current) drug therapy: Secondary | ICD-10-CM

## 2016-12-17 DIAGNOSIS — F17210 Nicotine dependence, cigarettes, uncomplicated: Secondary | ICD-10-CM | POA: Diagnosis present

## 2016-12-17 DIAGNOSIS — E860 Dehydration: Secondary | ICD-10-CM | POA: Diagnosis present

## 2016-12-17 DIAGNOSIS — J45909 Unspecified asthma, uncomplicated: Secondary | ICD-10-CM | POA: Diagnosis present

## 2016-12-17 DIAGNOSIS — F319 Bipolar disorder, unspecified: Secondary | ICD-10-CM | POA: Diagnosis present

## 2016-12-17 DIAGNOSIS — K860 Alcohol-induced chronic pancreatitis: Secondary | ICD-10-CM | POA: Diagnosis present

## 2016-12-17 DIAGNOSIS — Z681 Body mass index (BMI) 19 or less, adult: Secondary | ICD-10-CM

## 2016-12-17 DIAGNOSIS — M545 Low back pain: Secondary | ICD-10-CM | POA: Diagnosis present

## 2016-12-17 DIAGNOSIS — K219 Gastro-esophageal reflux disease without esophagitis: Secondary | ICD-10-CM

## 2016-12-17 DIAGNOSIS — K859 Acute pancreatitis without necrosis or infection, unspecified: Secondary | ICD-10-CM | POA: Diagnosis present

## 2016-12-17 DIAGNOSIS — E440 Moderate protein-calorie malnutrition: Secondary | ICD-10-CM | POA: Diagnosis present

## 2016-12-17 DIAGNOSIS — F17200 Nicotine dependence, unspecified, uncomplicated: Secondary | ICD-10-CM | POA: Diagnosis present

## 2016-12-17 DIAGNOSIS — G8929 Other chronic pain: Secondary | ICD-10-CM | POA: Diagnosis present

## 2016-12-17 DIAGNOSIS — K861 Other chronic pancreatitis: Secondary | ICD-10-CM | POA: Diagnosis present

## 2016-12-17 DIAGNOSIS — E785 Hyperlipidemia, unspecified: Secondary | ICD-10-CM | POA: Diagnosis present

## 2016-12-17 DIAGNOSIS — Z888 Allergy status to other drugs, medicaments and biological substances status: Secondary | ICD-10-CM

## 2016-12-17 DIAGNOSIS — E876 Hypokalemia: Secondary | ICD-10-CM | POA: Diagnosis present

## 2016-12-17 DIAGNOSIS — K852 Alcohol induced acute pancreatitis without necrosis or infection: Principal | ICD-10-CM | POA: Diagnosis present

## 2016-12-17 DIAGNOSIS — R3915 Urgency of urination: Secondary | ICD-10-CM | POA: Diagnosis not present

## 2016-12-17 DIAGNOSIS — F4310 Post-traumatic stress disorder, unspecified: Secondary | ICD-10-CM | POA: Diagnosis present

## 2016-12-17 DIAGNOSIS — F1020 Alcohol dependence, uncomplicated: Secondary | ICD-10-CM | POA: Diagnosis present

## 2016-12-17 DIAGNOSIS — Z720 Tobacco use: Secondary | ICD-10-CM | POA: Diagnosis present

## 2016-12-17 DIAGNOSIS — I10 Essential (primary) hypertension: Secondary | ICD-10-CM | POA: Diagnosis present

## 2016-12-17 DIAGNOSIS — Z91041 Radiographic dye allergy status: Secondary | ICD-10-CM

## 2016-12-17 DIAGNOSIS — D638 Anemia in other chronic diseases classified elsewhere: Secondary | ICD-10-CM | POA: Diagnosis present

## 2016-12-17 DIAGNOSIS — F332 Major depressive disorder, recurrent severe without psychotic features: Secondary | ICD-10-CM | POA: Diagnosis present

## 2016-12-17 LAB — COMPREHENSIVE METABOLIC PANEL
ALBUMIN: 3.9 g/dL (ref 3.5–5.0)
ALT: 47 U/L (ref 17–63)
AST: 61 U/L — AB (ref 15–41)
Alkaline Phosphatase: 109 U/L (ref 38–126)
Anion gap: 12 (ref 5–15)
BUN: 7 mg/dL (ref 6–20)
CHLORIDE: 103 mmol/L (ref 101–111)
CO2: 22 mmol/L (ref 22–32)
Calcium: 9 mg/dL (ref 8.9–10.3)
Creatinine, Ser: 0.66 mg/dL (ref 0.61–1.24)
GFR calc Af Amer: >60 mL/min (ref >60)
Glucose, Bld: 76 mg/dL (ref 65–99)
POTASSIUM: 5 mmol/L (ref 3.5–5.1)
Sodium: 137 mmol/L (ref 135–145)
Total Bilirubin: 1.7 mg/dL — ABNORMAL HIGH (ref 0.3–1.2)
Total Protein: 7.7 g/dL (ref 6.5–8.1)

## 2016-12-17 LAB — CBC WITH DIFFERENTIAL/PLATELET
Basophils Absolute: 0 10*3/uL (ref 0.0–0.1)
Basophils Relative: 0 %
EOS PCT: 3 %
Eosinophils Absolute: 0.4 10*3/uL (ref 0.0–0.7)
HCT: 33.9 % — ABNORMAL LOW (ref 39.0–52.0)
Hemoglobin: 11.3 g/dL — ABNORMAL LOW (ref 13.0–17.0)
LYMPHS ABS: 2.4 10*3/uL (ref 0.7–4.0)
LYMPHS PCT: 15 %
MCH: 27.9 pg (ref 26.0–34.0)
MCHC: 33.3 g/dL (ref 30.0–36.0)
MCV: 83.7 fL (ref 78.0–100.0)
MONOS PCT: 6 %
Monocytes Absolute: 1 10*3/uL (ref 0.1–1.0)
Neutro Abs: 12.4 10*3/uL — ABNORMAL HIGH (ref 1.7–7.7)
Neutrophils Relative %: 76 %
PLATELETS: 321 10*3/uL (ref 150–400)
RBC: 4.05 MIL/uL — AB (ref 4.22–5.81)
RDW: 18.2 % — ABNORMAL HIGH (ref 11.5–15.5)
WBC: 16.3 10*3/uL — AB (ref 4.0–10.5)

## 2016-12-17 LAB — ETHANOL: Alcohol, Ethyl (B): <5 mg/dL (ref <5)

## 2016-12-17 LAB — URINALYSIS, ROUTINE W REFLEX MICROSCOPIC
Bilirubin Urine: NEGATIVE
GLUCOSE, UA: NEGATIVE mg/dL
Hgb urine dipstick: NEGATIVE
Ketones, ur: NEGATIVE mg/dL
LEUKOCYTES UA: NEGATIVE
Nitrite: NEGATIVE
PH: 5 (ref 5.0–8.0)
Protein, ur: NEGATIVE mg/dL
Specific Gravity, Urine: 1.023 (ref 1.005–1.030)

## 2016-12-17 LAB — LIPASE, BLOOD: Lipase: 178 U/L — ABNORMAL HIGH (ref 11–51)

## 2016-12-17 MED ORDER — PANTOPRAZOLE SODIUM 40 MG IV SOLR
40.0000 mg | INTRAVENOUS | Status: DC
  Administered 2016-12-17 – 2016-12-20 (×4): 40 mg via INTRAVENOUS
  Filled 2016-12-17 (×4): qty 40

## 2016-12-17 MED ORDER — LORAZEPAM 2 MG/ML IJ SOLN
0.0000 mg | Freq: Four times a day (QID) | INTRAMUSCULAR | Status: AC
  Administered 2016-12-17 – 2016-12-19 (×4): 1 mg via INTRAVENOUS
  Filled 2016-12-17 (×4): qty 1

## 2016-12-17 MED ORDER — SODIUM CHLORIDE 0.9% FLUSH
3.0000 mL | Freq: Two times a day (BID) | INTRAVENOUS | Status: DC
  Administered 2016-12-17 – 2016-12-29 (×13): 3 mL via INTRAVENOUS

## 2016-12-17 MED ORDER — FLUTICASONE PROPIONATE 50 MCG/ACT NA SUSP
1.0000 | Freq: Every day | NASAL | Status: DC | PRN
  Filled 2016-12-17: qty 16

## 2016-12-17 MED ORDER — SODIUM CHLORIDE 0.9 % IV BOLUS (SEPSIS)
1000.0000 mL | Freq: Once | INTRAVENOUS | Status: AC
  Administered 2016-12-17: 1000 mL via INTRAVENOUS

## 2016-12-17 MED ORDER — GI COCKTAIL ~~LOC~~
30.0000 mL | Freq: Once | ORAL | Status: AC
  Administered 2016-12-17: 30 mL via ORAL
  Filled 2016-12-17: qty 30

## 2016-12-17 MED ORDER — SERTRALINE HCL 50 MG PO TABS
75.0000 mg | ORAL_TABLET | Freq: Every day | ORAL | Status: DC
  Administered 2016-12-17 – 2016-12-29 (×13): 75 mg via ORAL
  Filled 2016-12-17 (×13): qty 2

## 2016-12-17 MED ORDER — HYDROMORPHONE HCL 1 MG/ML IJ SOLN
1.0000 mg | Freq: Once | INTRAMUSCULAR | Status: AC
  Administered 2016-12-17: 1 mg via INTRAVENOUS
  Filled 2016-12-17: qty 1

## 2016-12-17 MED ORDER — NICOTINE 21 MG/24HR TD PT24
21.0000 mg | MEDICATED_PATCH | Freq: Every day | TRANSDERMAL | Status: DC
  Administered 2016-12-17 – 2016-12-28 (×12): 21 mg via TRANSDERMAL
  Filled 2016-12-17 (×14): qty 1

## 2016-12-17 MED ORDER — LORAZEPAM 1 MG PO TABS
1.0000 mg | ORAL_TABLET | Freq: Four times a day (QID) | ORAL | Status: AC | PRN
  Administered 2016-12-19 – 2016-12-20 (×2): 1 mg via ORAL
  Filled 2016-12-17 (×2): qty 1

## 2016-12-17 MED ORDER — PANCRELIPASE (LIP-PROT-AMYL) 12000-38000 UNITS PO CPEP
24000.0000 [IU] | ORAL_CAPSULE | Freq: Three times a day (TID) | ORAL | Status: DC
  Administered 2016-12-19 – 2016-12-29 (×30): 24000 [IU] via ORAL
  Filled 2016-12-17 (×31): qty 2

## 2016-12-17 MED ORDER — DIPHENHYDRAMINE HCL 25 MG PO CAPS
25.0000 mg | ORAL_CAPSULE | Freq: Four times a day (QID) | ORAL | Status: DC | PRN
  Administered 2016-12-26 – 2016-12-28 (×7): 25 mg via ORAL
  Filled 2016-12-17 (×7): qty 1

## 2016-12-17 MED ORDER — ONDANSETRON HCL 4 MG/2ML IJ SOLN
4.0000 mg | Freq: Four times a day (QID) | INTRAMUSCULAR | Status: DC | PRN
  Administered 2016-12-19 – 2016-12-26 (×4): 4 mg via INTRAVENOUS
  Filled 2016-12-17 (×5): qty 2

## 2016-12-17 MED ORDER — ALBUTEROL SULFATE (2.5 MG/3ML) 0.083% IN NEBU: 3.0000 mL | INHALATION_SOLUTION | Freq: Four times a day (QID) | RESPIRATORY_TRACT | Status: DC | PRN

## 2016-12-17 MED ORDER — THIAMINE HCL 100 MG/ML IJ SOLN
100.0000 mg | Freq: Every day | INTRAMUSCULAR | Status: DC
  Administered 2016-12-19: 100 mg via INTRAVENOUS
  Filled 2016-12-17: qty 2

## 2016-12-17 MED ORDER — VITAMIN B-1 100 MG PO TABS
100.0000 mg | ORAL_TABLET | Freq: Every day | ORAL | Status: DC
  Administered 2016-12-17 – 2016-12-29 (×12): 100 mg via ORAL
  Filled 2016-12-17 (×14): qty 1

## 2016-12-17 MED ORDER — METOPROLOL TARTRATE 25 MG PO TABS
25.0000 mg | ORAL_TABLET | Freq: Two times a day (BID) | ORAL | Status: DC
  Administered 2016-12-17 – 2016-12-29 (×24): 25 mg via ORAL
  Filled 2016-12-17 (×24): qty 1

## 2016-12-17 MED ORDER — CHOLESTYRAMINE 4 G PO PACK
4.0000 g | PACK | Freq: Three times a day (TID) | ORAL | Status: DC | PRN
  Filled 2016-12-17: qty 1

## 2016-12-17 MED ORDER — ADULT MULTIVITAMIN W/MINERALS CH
1.0000 | ORAL_TABLET | Freq: Every day | ORAL | Status: DC
  Administered 2016-12-17 – 2016-12-29 (×13): 1 via ORAL
  Filled 2016-12-17 (×13): qty 1

## 2016-12-17 MED ORDER — GABAPENTIN 300 MG PO CAPS
300.0000 mg | ORAL_CAPSULE | Freq: Three times a day (TID) | ORAL | Status: DC
  Administered 2016-12-17 – 2016-12-29 (×35): 300 mg via ORAL
  Filled 2016-12-17 (×37): qty 1

## 2016-12-17 MED ORDER — LORAZEPAM 2 MG/ML IJ SOLN
1.0000 mg | Freq: Four times a day (QID) | INTRAMUSCULAR | Status: AC | PRN
  Administered 2016-12-18 – 2016-12-19 (×2): 1 mg via INTRAVENOUS
  Filled 2016-12-17 (×2): qty 1

## 2016-12-17 MED ORDER — HYDROMORPHONE HCL 1 MG/ML IJ SOLN
0.5000 mg | INTRAMUSCULAR | Status: DC | PRN
  Administered 2016-12-17 – 2016-12-19 (×15): 1 mg via INTRAVENOUS
  Filled 2016-12-17 (×15): qty 1

## 2016-12-17 MED ORDER — LORAZEPAM 2 MG/ML IJ SOLN
0.0000 mg | Freq: Two times a day (BID) | INTRAMUSCULAR | Status: AC
Start: 2016-12-19 — End: 2016-12-21
  Administered 2016-12-19 – 2016-12-21 (×4): 1 mg via INTRAVENOUS
  Filled 2016-12-17 (×4): qty 1

## 2016-12-17 MED ORDER — ONDANSETRON HCL 4 MG/2ML IJ SOLN
4.0000 mg | Freq: Once | INTRAMUSCULAR | Status: AC
  Administered 2016-12-17: 4 mg via INTRAVENOUS
  Filled 2016-12-17: qty 2

## 2016-12-17 MED ORDER — PANTOPRAZOLE SODIUM 40 MG IV SOLR
40.0000 mg | Freq: Once | INTRAVENOUS | Status: AC
  Administered 2016-12-17: 40 mg via INTRAVENOUS
  Filled 2016-12-17: qty 40

## 2016-12-17 MED ORDER — SODIUM CHLORIDE 0.9 % IV SOLN
INTRAVENOUS | Status: AC
  Administered 2016-12-17 – 2016-12-18 (×2): via INTRAVENOUS

## 2016-12-17 MED ORDER — ONDANSETRON HCL 4 MG PO TABS: 4.0000 mg | ORAL_TABLET | Freq: Four times a day (QID) | ORAL | Status: DC | PRN

## 2016-12-17 MED ORDER — GI COCKTAIL ~~LOC~~
30.0000 mL | Freq: Two times a day (BID) | ORAL | Status: DC
  Administered 2016-12-17 – 2016-12-29 (×24): 30 mL via ORAL
  Filled 2016-12-17 (×25): qty 30

## 2016-12-17 MED ORDER — SUCRALFATE 1 G PO TABS: 1.0000 g | ORAL_TABLET | Freq: Three times a day (TID) | ORAL | Status: DC

## 2016-12-17 MED ORDER — POLYETHYLENE GLYCOL 3350 17 G PO PACK: 17.0000 g | PACK | Freq: Every day | ORAL | Status: DC | PRN

## 2016-12-17 MED ORDER — FOLIC ACID 1 MG PO TABS
1.0000 mg | ORAL_TABLET | Freq: Every day | ORAL | Status: DC
  Administered 2016-12-17 – 2016-12-29 (×13): 1 mg via ORAL
  Filled 2016-12-17 (×12): qty 1

## 2016-12-17 MED ORDER — ENOXAPARIN SODIUM 40 MG/0.4ML ~~LOC~~ SOLN
40.0000 mg | SUBCUTANEOUS | Status: DC
  Administered 2016-12-17 – 2016-12-28 (×12): 40 mg via SUBCUTANEOUS
  Filled 2016-12-17 (×12): qty 0.4

## 2016-12-17 MED ORDER — IBUPROFEN 400 MG PO TABS
400.0000 mg | ORAL_TABLET | Freq: Four times a day (QID) | ORAL | Status: DC | PRN
  Administered 2016-12-19: 400 mg via ORAL
  Filled 2016-12-17: qty 1

## 2016-12-17 MED ORDER — LORATADINE 10 MG PO TABS
10.0000 mg | ORAL_TABLET | Freq: Every day | ORAL | Status: DC
  Administered 2016-12-17 – 2016-12-29 (×13): 10 mg via ORAL
  Filled 2016-12-17 (×13): qty 1

## 2016-12-17 NOTE — ED Notes (Signed)
Pt provided with gingerale and states that it makes his stomach hurt worse to drink anything.

## 2016-12-17 NOTE — ED Triage Notes (Signed)
To triage via EMS.  Onset 9am N/V x 3/D x 4.  Onset 10 am left upper and lower ad pian.  EMS gave Zofran 4mg  with little relief.  Last drank alcohol last night, beer.

## 2016-12-17 NOTE — H&P (Signed)
History and Physical    [name] GHW:[id number] DOB: 1970/11/12 DOA: 12/17/2016  PCP: Patient, No Pcp Per, currently not seeing a provider per him.  Used to see Dr. Doreene Burke, last visit in this system was 1 year ago.   Patient coming from: Home  Chief Complaint: LUQ and epigastric abdominal pain.   HPI: [name] is a 46 y.o. male with medical history significant of pancreatitis with frequent ED visits and abdominal pain, WPW s/p ablation, HTN, HLD, GERD, CLBP, asthma, anxiety/depression who presents for abdominal pain.  He reports that the pain started this morning in the LUQ and epigastrium but is also diffuse.  It radiates into the back and is sharp.  Is 10/10 at it's worst.  Improved with hydromorphone given in the ED and worse with any intake (worsened with fluid intake in the ED).  He has had associated nausea and vomiting X 3 today which was non bloody and non bilious.  He further has had diarrhea for the last day and reports palpitations and feeling like his heart was racing.  Further symptoms included a slight fever this morning which he says has resolved.  He has had multiple episodes of this before due to ETOH intake.  He reports trying to cut back, but drinking yesterday.  He used to be on naltrexone which helped with the cravings, but has not had that for a while.  In the past year, he has been to the ED for this issue alone 20 times and has been admitted once.  He has been to the ED for other issues as well including ETOH withdrawal.  He further has a psych diagnosis (bipolar, anxiety and depression in chart).  He reports only being on sertraline currently.  Was previously on haldol.    ED Course: In the ED, he was found to have an elevated lipase, elevated WBC.  He was started on fluids and given pain medication.  An oral trial was failed with liquids.    Review of Systems: As per HPI otherwise 10 point review of systems negative.   Past Medical History:    Diagnosis Date  . Alcoholism /alcohol abuse ([place])   . Anemia   . Anxiety   . Arthritis    "knees; arms; elbows" (03/26/2015)  . Asthma   . Bipolar disorder ([place])   . Chronic bronchitis ([place])   . Chronic lower back pain   . Cocaine abuse   . Depression   . Family history of adverse reaction to anesthesia    "grandmother gets confused"  . Femoral condyle fracture ([place]) 03/08/2014   left medial/notes 03/09/2014  . GERD (gastroesophageal reflux disease)   . H/O hiatal hernia   . H/O suicide attempt 10/2012  . Heart murmur    "when he was little" (03/06/2013)  . High cholesterol   . History of blood transfusion 10/2012   "when I tried to commit suicide"  . History of stomach ulcers   . Hypertension   . Marijuana abuse, continuous   . Migraine    "a few times/year" (03/26/2015)  . Pancreatitis   . Pneumonia 1990's X 3  . PTSD (post-traumatic stress disorder)   . Shortness of breath    "can happen at anytime" (03/06/2013)  . Sickle cell trait ([place])   . WPW (Wolff-Parkinson-White syndrome)    [name] 03/06/2013    Past Surgical History:  Procedure Laterality Date  . CARDIAC CATHETERIZATION    . EYE SURGERY Left 1990's   "  result of trauma"   . FACIAL FRACTURE SURGERY Left 1990's   "result of trauma"   . FRACTURE SURGERY    . HERNIA REPAIR    . LEFT HEART CATHETERIZATION WITH CORONARY ANGIOGRAM Right 03/07/2013   Procedure: LEFT HEART CATHETERIZATION WITH CORONARY ANGIOGRAM;  Surgeon: [name], MD;  Location: [facility];  Service: Cardiovascular;  Laterality: Right;  . UMBILICAL HERNIA REPAIR     Reviewed with patient.  Daily drinking +  reports that he has been smoking Cigarettes.  He has been smoking about 1.00 pack per day. He has never used smokeless tobacco. He reports that he drinks alcohol. He reports that he does not use drugs.  Allergies  Allergen Reactions  . Shellfish-Derived Products Nausea And Vomiting  . Trazodone And Nefazodone Other (See Comments)     Reaction:  Muscle spasms  . Robaxin [Methocarbamol] Other (See Comments)    "jumpy limbs"  . Contrast Media [Iodinated Diagnostic Agents] Hives  . Reglan [Metoclopramide] Other (See Comments)    Reaction:  Muscle spasms   Reviewed with patient.  Family History  Problem Relation Age of Onset  . Hypertension Mother   . Hypertension Father   . Hypertension Other   . Coronary artery disease Other    Reviewed medications, but he was a little clandestine about what he is exactly taking.  I started ones he stated he was on.  Prior to Admission medications   Medication Sig Start Date End Date Taking? Authorizing Provider  albuterol (PROVENTIL HFA;VENTOLIN HFA) 108 (90 Base) MCG/ACT inhaler Inhale 2 puffs into the lungs every 6 (six) hours as needed for wheezing or shortness of breath. 07/25/16  Yes Lindell Spar I, NP  Alum & Mag Hydroxide-Simeth (GI COCKTAIL) SUSP suspension Take 30 mLs by mouth 2 (two) times daily. Shake well.   Yes [provider]  cholestyramine (QUESTRAN) 4 g packet Take 1 packet (4 g total) by mouth 3 (three) times daily as needed (diarrhea). 07/25/16  Yes Lindell Spar I, NP  diphenhydrAMINE (BENADRYL) 25 mg capsule Take 25 mg by mouth every 6 (six) hours as needed.   Yes [provider]  famotidine (PEPCID) 20 MG tablet Take 1 tablet (20 mg total) by mouth 2 (two) times daily. 08/10/16  Yes Charlesetta Shanks, MD  fluticasone (FLONASE) 50 MCG/ACT nasal spray Place 1 spray into both nostrils daily as needed for rhinitis. 07/25/16  Yes Lindell Spar I, NP  gabapentin (NEURONTIN) 300 MG capsule Take 1 capsule (300 mg total) by mouth 3 (three) times daily. For agitation 07/25/16  Yes Lindell Spar I, NP  haloperidol (HALDOL) 10 MG tablet Take 10 mg by mouth at bedtime. 10/12/16  Yes [provider]  hydrocerin (EUCERIN) CREA Apply 1 application topically 2 (two) times daily. For dry skin Patient taking differently: Apply 1 application topically 2 (two) times  daily as needed. For dry skin 07/25/16  Yes Lindell Spar I, NP  hydrOXYzine (ATARAX/VISTARIL) 25 MG tablet Take 1 tablet (25 mg total) by mouth every 6 (six) hours as needed for anxiety. 07/25/16  Yes Lindell Spar I, NP  ibuprofen (ADVIL,MOTRIN) 200 MG tablet Take 400 mg by mouth every 6 (six) hours as needed.   Yes [provider]  lipase/protease/amylase (CREON) 12000 units CPEP capsule Take 2 capsules (24,000 Units total) by mouth 3 (three) times daily with meals. For pancreatitis 07/25/16  Yes Lindell Spar I, NP  loratadine (CLARITIN) 10 MG tablet Take 1 tablet (10 mg total) by mouth  daily. (May purchase from over the counter): For allergies 07/26/16  Yes Lindell Spar I, NP  metoprolol tartrate (LOPRESSOR) 25 MG tablet Take 1 tablet (25 mg total) by mouth 2 (two) times daily. For high blood pressure 07/25/16  Yes Nwoko, Agnes I, NP  naltrexone (DEPADE) 50 MG tablet Take 1 tablet (50 mg total) by mouth daily. For alcoholism 07/26/16  Yes Lindell Spar I, NP  nicotine polacrilex (NICORETTE) 2 MG gum Take 1 each (2 mg total) by mouth as needed for smoking cessation. 07/25/16  Yes Lindell Spar I, NP  ondansetron (ZOFRAN ODT) 4 MG disintegrating tablet Take 1 tablet (4 mg total) by mouth every 8 (eight) hours as needed for nausea or vomiting. 09/26/16  Yes Horton, Barbette Hair, MD  pantoprazole (PROTONIX) 40 MG tablet Take 1 tablet (40 mg total) by mouth daily. 09/24/16  Yes Julianne Rice, MD  promethazine (PHENERGAN) 25 MG tablet Take 1 tablet (25 mg total) by mouth every 6 (six) hours as needed for nausea or vomiting. 08/25/16  Yes Kirichenko, Tatyana, PA-C  sertraline (ZOLOFT) 25 MG tablet Take 3 tablets (75 mg total) by mouth daily. For depression 07/26/16  Yes Nwoko, Herbert Pun I, NP  sucralfate (CARAFATE) 1 g tablet Take 1 tablet (1 g total) by mouth 4 (four) times daily -  with meals and at bedtime. 09/26/16  Yes Horton, Barbette Hair, MD  benzocaine (ORAJEL) 10 % mucosal gel Use as directed in the mouth or  throat 4 (four) times daily as needed for mouth pain. Patient not taking: Reported on 12/17/2016 07/25/16   Lindell Spar I, NP  haloperidol (HALDOL) 5 MG tablet Take 1 tablet (5 mg total) by mouth 2 (two) times daily. For mood control Patient not taking: Reported on 11/22/2016 07/25/16   Lindell Spar I, NP  HYDROcodone-acetaminophen (NORCO/VICODIN) 5-325 MG tablet Take 1 tablet by mouth every 6 (six) hours as needed. Patient not taking: Reported on 11/22/2016 08/26/16   Junius Creamer, NP  ondansetron (ZOFRAN) 4 MG tablet Take 1 tablet (4 mg total) by mouth every 8 (eight) hours as needed for nausea or vomiting. Patient not taking: Reported on 12/17/2016 09/24/16   Julianne Rice, MD  sucralfate (CARAFATE) 1 g tablet Take 1 tablet (1 g total) by mouth 4 (four) times daily -  with meals and at bedtime. Patient not taking: Reported on 12/17/2016 09/24/16   Julianne Rice, MD    Physical Exam: Vitals:   12/17/16 1800 12/17/16 1815 12/17/16 1830 12/17/16 1900  BP: (!) 141/97 (!) 144/98 (!) 146/83 140/89  Pulse: 96 95 80 99  Resp: 19 16 18  (!) 21  Temp:      SpO2: 93% 98% 99% 100%  Weight:        Constitutional: Sitting up in bed, thin AA man, holding abdomen.  Vitals:   12/17/16 1800 12/17/16 1815 12/17/16 1830 12/17/16 1900  BP: (!) 141/97 (!) 144/98 (!) 146/83 140/89  Pulse: 96 95 80 99  Resp: 19 16 18  (!) 21  Temp:      SpO2: 93% 98% 99% 100%  Weight:       Eyes: PERRL, He has muddy sclerae, but appears chronic.   ENMT: Mucous membranes are dry.  No under the tongue yellowing noted.   Neck: normal, supple Respiratory: clear to auscultation bilaterally, no wheezing, no crackles. Normal respiratory effort.  Cardiovascular: Regular rate and normal rhythm, no murmurs / rubs / gallops. No extremity edema. 2+ pedal pulses.  Abdomen: + TTP in epigastrium and  LUQ, but diffusely mildly tender.  Hypoactive bowel sounds.   Musculoskeletal: no clubbing / cyanosis. No joint deformity upper and lower  extremities.  Normal muscle tone.  Skin: no rashes, lesions, ulcers.  Neurologic: Grossly intact, moving all extremities Psychiatric: Normal judgment and insight. Alert and oriented x 3. Normal mood.    Labs on Admission: I have personally reviewed following labs and imaging studies  CBC:  Recent Labs Lab 12/17/16 1640  WBC 16.3*  NEUTROABS 12.4*  HGB 11.3*  HCT 33.9*  MCV 83.7  PLT 263   Basic Metabolic Panel:  Recent Labs Lab 12/17/16 1640  NA 137  K 5.0  CL 103  CO2 22  GLUCOSE 76  BUN 7  CREATININE 0.66  CALCIUM 9.0   GFR: Estimated Creatinine Clearance: 90.4 mL/min (by C-G formula based on SCr of 0.66 mg/dL). Liver Function Tests:  Recent Labs Lab 12/17/16 1640  AST 61*  ALT 47  ALKPHOS 109  BILITOT 1.7*  PROT 7.7  ALBUMIN 3.9    Recent Labs Lab 12/17/16 1640  LIPASE 178*   No results for input(s): AMMONIA in the last 168 hours. Coagulation Profile: No results for input(s): INR, PROTIME in the last 168 hours. Cardiac Enzymes: No results for input(s): CKTOTAL, CKMB, CKMBINDEX, TROPONINI in the last 168 hours. BNP (last 3 results) No results for input(s): PROBNP in the last 8760 hours. HbA1C: No results for input(s): HGBA1C in the last 72 hours. CBG: No results for input(s): GLUCAP in the last 168 hours. Lipid Profile: No results for input(s): CHOL, HDL, LDLCALC, TRIG, CHOLHDL, LDLDIRECT in the last 72 hours. Thyroid Function Tests: No results for input(s): TSH, T4TOTAL, FREET4, T3FREE, THYROIDAB in the last 72 hours. Anemia Panel: No results for input(s): VITAMINB12, FOLATE, FERRITIN, TIBC, IRON, RETICCTPCT in the last 72 hours. Urine analysis:    Component Value Date/Time   COLORURINE YELLOW 12/17/2016 [place] 12/17/2016 1719   LABSPEC 1.023 12/17/2016 1719   PHURINE 5.0 12/17/2016 1719   GLUCOSEU NEGATIVE 12/17/2016 1719   HGBUR NEGATIVE 12/17/2016 1719   HGBUR negative 04/30/2010 1020   BILIRUBINUR NEGATIVE  12/17/2016 1719   KETONESUR NEGATIVE 12/17/2016 1719   PROTEINUR NEGATIVE 12/17/2016 1719   UROBILINOGEN 0.2 03/15/2015 0508   NITRITE NEGATIVE 12/17/2016 1719   LEUKOCYTESUR NEGATIVE 12/17/2016 1719    Radiological Exams on Admission: No results found.   Assessment/Plan Acute on chronic pancreatitis  - IVF with NS at 125cc/hr while NPO - Trial of ice chips - Nausea control with Zofran IV - Pain control with hydromorphone IV, transition to PO once able - Trial of fluids once able to eat - Given narcotics, PRN miralax ordered - CMET in the AM - Review of chart, appears ETOH is the culprit.  US of the GB has not shown gallstones in the past, TG have been regularly normal in the past, last checked in March of this year. - Continue home creon, sucralfate - Changed protonix to IV while not eating.   - Tachycardia improved with fluids - Last imaging done in July showed 3 small pseudocysts.  He does have an elevated WBC, but I would think this is likely reactive.  If he spikes a fever, or acutely worsens, would get a STAT CT abdomen and start Abx  Alcohol use disorder, severe, dependence - He reports withdrawing in the past - CIWA protocol - He is interested in using naltrexone again.  He will need to be off narcotics to start. - Cessation  counseling     Tobacco Aubse - Nicotine patch - Cessation counseling.     MDD (major depressive disorder), recurrent severe, without psychosis  - Chronic issue as well, likely contributes to his ongoing ETOH use - He reports using sertraline, but not haldol - Sertraline started  Essential hypertension - BP mildly elevated, getting fluids - Home metoprolol started.     Chronic low back pain - Reports that he is on gabapentin, which I restarted.   Anemia - Chronic, appears stable, he reports no bleeding - Trend.   DVT prophylaxis: Lovenox Code Status: Full Disposition Plan: Admit for fluids, pain control Consults called:  None Admission status: med surg, inpatient   Gilles Chiquito MD [facility] Pager [phone]  If 7PM-7AM, please contact night-coverage [email or web address] Password [email or web address]  12/17/2016, 8:01 PM
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
## 2016-12-18 DIAGNOSIS — E876 Hypokalemia: Secondary | ICD-10-CM

## 2016-12-18 DIAGNOSIS — K861 Other chronic pancreatitis: Secondary | ICD-10-CM

## 2016-12-18 DIAGNOSIS — F17200 Nicotine dependence, unspecified, uncomplicated: Secondary | ICD-10-CM

## 2016-12-18 DIAGNOSIS — F1010 Alcohol abuse, uncomplicated: Secondary | ICD-10-CM

## 2016-12-18 DIAGNOSIS — I10 Essential (primary) hypertension: Secondary | ICD-10-CM

## 2016-12-18 DIAGNOSIS — K859 Acute pancreatitis without necrosis or infection, unspecified: Secondary | ICD-10-CM

## 2016-12-18 DIAGNOSIS — F332 Major depressive disorder, recurrent severe without psychotic features: Secondary | ICD-10-CM

## 2016-12-18 LAB — COMPREHENSIVE METABOLIC PANEL
ALBUMIN: 3 g/dL — AB (ref 3.5–5.0)
ALT: 28 U/L (ref 17–63)
AST: 21 U/L (ref 15–41)
Alkaline Phosphatase: 93 U/L (ref 38–126)
Anion gap: 5 (ref 5–15)
CHLORIDE: 105 mmol/L (ref 101–111)
CO2: 25 mmol/L (ref 22–32)
CREATININE: 0.53 mg/dL — AB (ref 0.61–1.24)
Calcium: 8 mg/dL — ABNORMAL LOW (ref 8.9–10.3)
GFR calc Af Amer: >60 mL/min (ref >60)
GFR calc non Af Amer: >60 mL/min (ref >60)
GLUCOSE: 88 mg/dL (ref 65–99)
POTASSIUM: 3.2 mmol/L — AB (ref 3.5–5.1)
SODIUM: 135 mmol/L (ref 135–145)
Total Bilirubin: 1.1 mg/dL (ref 0.3–1.2)
Total Protein: 6.3 g/dL — ABNORMAL LOW (ref 6.5–8.1)

## 2016-12-18 LAB — MAGNESIUM: Magnesium: 1.6 mg/dL — ABNORMAL LOW (ref 1.7–2.4)

## 2016-12-18 MED ORDER — SODIUM CHLORIDE 0.9 % IV SOLN
INTRAVENOUS | Status: AC
  Administered 2016-12-18 (×2): via INTRAVENOUS

## 2016-12-18 MED ORDER — MAGNESIUM SULFATE 2 GM/50ML IV SOLN
2.0000 g | Freq: Once | INTRAVENOUS | Status: AC
  Administered 2016-12-18: 2 g via INTRAVENOUS
  Filled 2016-12-18: qty 50

## 2016-12-18 MED ORDER — POTASSIUM CHLORIDE CRYS ER 20 MEQ PO TBCR
40.0000 meq | EXTENDED_RELEASE_TABLET | Freq: Once | ORAL | Status: AC
  Administered 2016-12-18: 40 meq via ORAL
  Filled 2016-12-18: qty 2

## 2016-12-18 MED ORDER — ORAL CARE MOUTH RINSE
15.0000 mL | Freq: Two times a day (BID) | OROMUCOSAL | Status: DC
  Administered 2016-12-18 – 2016-12-27 (×11): 15 mL via OROMUCOSAL

## 2016-12-18 NOTE — Progress Notes (Signed)
[facility] PROGRESS NOTE  Abdallah Hern MIW:[id number] DOB: Sep 20, 1970 DOA: 12/17/2016 PCP: Patient, No Pcp Per  Interim summary and HPI 46 y.o. male with medical history significant of pancreatitis with frequent ED visits and abdominal pain, WPW s/p ablation, HTN, HLD, GERD, CLBP, asthma, anxiety/depression who presents for abdominal pain. 10/10 at it's worst, mid abd and LUQ; radiates to his back and is associated with nausea and vomiting. Patient found with acute on chronic pancreatitis.  Assessment/Plan: 1-acute on chronic pancreatitis -due to alcohol consumption -continue IVF, antiemetics and PRN analgesics  -continue bowel rest today -overall lipase lower than on previous visits  -will follow clinical response -will continue creon  2-ETOH abuse -no acute withdrawal seen currently -will continue CIWA protocol -continue thiamine and folic acid -cessation counseling provided  3-tobacco abuse -will continue nicotine patch -cessation counseling provided  4-hypokalemia -due to GI (vomiting prior to admission and with 2 episodes of diarrhea) -will replete and monitor trend   5-hypomagnesemia -will replete and follow trend -most likely associated with ETOH abuse  6-HTN -will continue metoprolol  7-depression -will continue sertraline   Code Status: Full Family Communication: no family at bedside  Disposition Plan: remains inpatient, continue CIWA, continue supportive/conservative management for pancreatitis.    Consultants:  None   Procedures:  See below for x-ray reports   Antibiotics:  None   HPI/Subjective: Afebrile, no CP, no SOB, denies vomiting. Patient still nauseated and with complaints of abd pain.   Objective: Vitals:   12/18/16 1350 12/18/16 1739  BP: (!) 145/98 120/84  Pulse: 84 79  Resp: 18   Temp: 98 F (36.7 C)   SpO2: 98%     Intake/Output Summary (Last 24 hours) at 12/18/16 1839 Last data filed at 12/18/16 1448  Gross per 24 hour  Intake          2223.75 ml  Output                0 ml  Net          2223.75 ml   Filed Weights   12/17/16 1315 12/17/16 2043  Weight: 56 kg (123 lb 9 oz) 58.3 kg (128 lb 9.6 oz)    Exam:   General:  Afebrile, no CP or SOB. Reported feeling sligtly nauseated, but denies any vomiting. Still with complaints of abd pain. No looking to eat.  Cardiovascular: S1 and s2, no rubs, no gallops  Respiratory: CTA bilaterally   Abdomen: tender to palpation mid abdomen and LUQ. No guarding, positive BS  Musculoskeletal: no edema, no cyanosis   Data Reviewed: Basic Metabolic Panel:  Recent Labs Lab 12/17/16 1640 12/18/16 0534 12/18/16 0855  NA 137 135  --   K 5.0 3.2*  --   CL 103 105  --   CO2 22 25  --   GLUCOSE 76 88  --   BUN 7 <5*  --   CREATININE 0.66 0.53*  --   CALCIUM 9.0 8.0*  --   MG  --   --  1.6*   Liver Function Tests:  Recent Labs Lab 12/17/16 1640 12/18/16 0534  AST 61* 21  ALT 47 28  ALKPHOS 109 93  BILITOT 1.7* 1.1  PROT 7.7 6.3*  ALBUMIN 3.9 3.0*    Recent Labs Lab 12/17/16 1640  LIPASE 178*   CBC:  Recent Labs Lab 12/17/16 1640  WBC 16.3*  NEUTROABS 12.4*  HGB 11.3*  HCT 33.9*  MCV 83.7  PLT 321   BNP (  last 3 results)  Recent Labs  07/03/16 1314  BNP 52.9    Studies: No results found.  Scheduled Meds: . enoxaparin (LOVENOX) injection  40 mg Subcutaneous Q24H  . folic acid  1 mg Oral Daily  . gabapentin  300 mg Oral TID  . gi cocktail  30 mL Oral BID  . lipase/protease/amylase  24,000 Units Oral TID WC  . loratadine  10 mg Oral Daily  . LORazepam  0-4 mg Intravenous Q6H   Followed by  . [START ON 12/19/2016] LORazepam  0-4 mg Intravenous Q12H  . mouth rinse  15 mL Mouth Rinse q12n4p  . metoprolol tartrate  25 mg Oral BID  . multivitamin with minerals  1 tablet Oral Daily  . nicotine  21 mg Transdermal QHS  . pantoprazole (PROTONIX) IV  40 mg Intravenous Q24H  . sertraline  75 mg Oral Daily  .  sodium chloride flush  3 mL Intravenous Q12H  . thiamine  100 mg Oral Daily   Or  . thiamine  100 mg Intravenous Daily   Continuous Infusions: . sodium chloride 100 mL/hr at 12/18/16 1210    Active Problems:   TOBACCO ABUSE   MDD (major depressive disorder), recurrent severe, without psychosis ([place])   Alcohol dependence ([place])   Essential hypertension   Malnutrition of moderate degree   Alcohol use disorder, severe, dependence ([place])   Acute on chronic pancreatitis ([place])    Time spent: 30 minutes    Barton Dubois  [facility] Pager [phone]. If 7PM-7AM, please contact night-coverage at [email or web address], password [facility] 12/18/2016, 6:39 PM  LOS: 1 day

## 2016-12-18 NOTE — Progress Notes (Signed)
Received pt per bed, alert and oriented. Ambulated to bed. Oriented pt with room.

## 2016-12-19 DIAGNOSIS — E43 Unspecified severe protein-calorie malnutrition: Secondary | ICD-10-CM

## 2016-12-19 DIAGNOSIS — D72829 Elevated white blood cell count, unspecified: Secondary | ICD-10-CM

## 2016-12-19 DIAGNOSIS — K852 Alcohol induced acute pancreatitis without necrosis or infection: Principal | ICD-10-CM

## 2016-12-19 LAB — BASIC METABOLIC PANEL
Anion gap: 6 (ref 5–15)
BUN: <5 mg/dL — ABNORMAL LOW (ref 6–20)
CHLORIDE: 108 mmol/L (ref 101–111)
CO2: 24 mmol/L (ref 22–32)
Calcium: 8.3 mg/dL — ABNORMAL LOW (ref 8.9–10.3)
Creatinine, Ser: 0.6 mg/dL — ABNORMAL LOW (ref 0.61–1.24)
Glucose, Bld: 90 mg/dL (ref 65–99)
POTASSIUM: 3.6 mmol/L (ref 3.5–5.1)
SODIUM: 138 mmol/L (ref 135–145)

## 2016-12-19 LAB — MAGNESIUM: MAGNESIUM: 1.9 mg/dL (ref 1.7–2.4)

## 2016-12-19 LAB — PHOSPHORUS: PHOSPHORUS: 3.1 mg/dL (ref 2.5–4.6)

## 2016-12-19 MED ORDER — SODIUM CHLORIDE 0.9 % IV SOLN
INTRAVENOUS | Status: DC
  Administered 2016-12-19 – 2016-12-28 (×18): via INTRAVENOUS

## 2016-12-19 MED ORDER — BOOST / RESOURCE BREEZE PO LIQD
1.0000 | Freq: Three times a day (TID) | ORAL | Status: DC
  Administered 2016-12-19 – 2016-12-20 (×5): 1 via ORAL

## 2016-12-19 MED ORDER — HYDROMORPHONE HCL 1 MG/ML IJ SOLN
2.0000 mg | INTRAMUSCULAR | Status: DC | PRN
  Administered 2016-12-20 – 2016-12-29 (×72): 2 mg via INTRAVENOUS
  Filled 2016-12-19 (×73): qty 2

## 2016-12-19 NOTE — Progress Notes (Signed)
Initial Nutrition Assessment  DOCUMENTATION CODES:   Severe malnutrition in context of acute illness/injury  INTERVENTION:   Boost Breeze po TID, each supplement provides 250 kcal and 9 grams of protein  MVI  NUTRITION DIAGNOSIS:   Malnutrition (severe) related to acute on chronic illness as evidenced by energy intake < or equal to 50% for > or equal to 5 days, moderate depletions of body fat, moderate depletions of muscle mass.  GOAL:   Patient will meet greater than or equal to 90% of their needs  MONITOR:   PO intake, Supplement acceptance, Labs, Weight trends  REASON FOR ASSESSMENT:   Malnutrition Screening Tool    ASSESSMENT:   46 y.o. male with medical history significant of pancreatitis with frequent ED visits and abdominal pain, WPW s/p ablation, HTN, HLD, GERD, CLBP, asthma, etoh abuse, anxiety/depression who presents for abdominal pain. 10/10 at it's worst, mid abd and LUQ; radiates to his back and is associated with nausea and vomiting. Patient found with acute on chronic pancreatitis.   Met with pt in room today. Pt reports intermittent poor appetite and oral intake pta r/t etoh abuse and chronic pancreatitis. Pt reports that he has to be very careful about what he eats because certain foods upset his stomach. Pt reports nausea, diarrhea, and vomiting for 3 days pta. Pt reports that today he ate some jello and some broth and then vomited it back up. Pt also reports diarrhea today. Pt would like to have Boost Breeze; RD will order. Can order Ensure when diet advanced. Per chart, pt has lost 2lbs(2%) in <2 weeks; RD suspects this may be related to fluid losses.   Medications reviewed and include: lovenox, folic acid, GI cocktail, creon, MVI, nicotine, protonix, thiamine, hydromorphone, zofran  Labs reviewed: BUN <5(L), creat 0.60(L), Ca 8.3(L), P 3.1 wnl, Mg 1.9 wnl Wbc- 16.3(H)  Nutrition-Focused physical exam completed. Findings are moderate fat depletions in arms  and chest, moderate muscle depletions in clavicles, hands, and lower legs, and no edema.   Diet Order:  Diet clear liquid Room service appropriate? Yes; Fluid consistency: Thin  Skin:  Reviewed, no issues  Last BM:  12/18/16  Height:   Ht Readings from Last 1 Encounters:  12/17/16 5' 9" (1.753 m)    Weight:   Wt Readings from Last 1 Encounters:  12/17/16 128 lb 9.6 oz (58.3 kg)    Ideal Body Weight:  72.7 kg  BMI:  Body mass index is 18.99 kg/m.  Estimated Nutritional Needs:   Kcal:  1750-2050kcal/day   Protein:  76-87g/day   Fluid:  >1.7L/day   EDUCATION NEEDS:   No education needs identified at this time    MS, RD, LDN Pager #- [phone] After Hours Pager: [phone]  

## 2016-12-19 NOTE — Care Management Note (Addendum)
Case Management Note  Patient Details  Name: [name] MRN: [id number] Date of Birth: 28-Oct-1970  Subjective/Objective:                    Action/Plan:  Discussed medication assistance with patient at bedside. Patient states he does have prescription coverage, therefore he does not qualify for [facility] program.   Provided and explained [place] assistance program application . Patient has to complete his section and then have PCP complete MD section. Patient voiced understanding.  Patient is active with [facility] and Internal Medicine, however, he would like to change to [facility] on [facility], because he can walk there.  Consulted for NCM to make appointment.  Called same first available appointment is January 11, 2017 at 1100. Added to discharge follow up appointments.   Expected Discharge Date:                  Expected Discharge Plan:  Home/Self Care  In-House Referral:     Discharge planning Services  CM Consult, [facility], Medication Assistance  Post Acute Care Choice:    Choice offered to:  Patient  DME Arranged:    DME Agency:     HH Arranged:    [place] Agency:     Status of Service:  In process, will continue to follow  If discussed at Long Length of Stay Meetings, dates discussed:    Additional Comments:  Marilu Favre, RN 12/19/2016, 11:37 AM

## 2016-12-19 NOTE — Progress Notes (Signed)
[facility] PROGRESS NOTE  Rayansh Herbst SWF:[id number] DOB: 1970/08/23 DOA: 12/17/2016 PCP: Patient, No Pcp Per  Interim summary and HPI 46 y.o. male with medical history significant of pancreatitis with frequent ED visits and abdominal pain, WPW s/p ablation, HTN, HLD, GERD, CLBP, asthma, anxiety/depression who presents for abdominal pain. 10/10 at it's worst, mid abd and LUQ; radiates to his back and is associated with nausea and vomiting. Patient found with acute on chronic pancreatitis.  Assessment/Plan: 1-acute on chronic pancreatitis -due to alcohol consumption -continue IVF, antiemetics and PRN analgesics (will adjust pain meds, as patient still symptomatic)  -will allow small amount of clear liquids -overall lipase lower; will repeat labs in am -continue Creon -will follow clinical response -patient is afebrile  2-ETOH abuse -no acute withdrawal appreciated -will continue CIWA protocol -continue thiamine and folic acid -cessation counseling once again provided; CM/SW to provide resources for outpatient assistance.  3-tobacco abuse -will continue nicotine patch -cessation counseling encouraged   4-hypokalemia -due to GI loses (vomiting prior to admission and with 2 episodes of diarrhea) -continue repletion as needed  5-hypomagnesemia -most likely associated with ETOH abuse -repleted and WNL currently - will monitor and replete further as needed   6-HTN -stable -continue metoprolol  7-depression -continue Sertraline  8-severe protein calorie malnutrition -in context of acute illness  -will follow nutritional service rec's -started on feeding supplements.  7-leukocytosis -appears to be secondary to stress demargination -will repeat CBC to follow WBC's trend  -no fever  Code Status: Full Family Communication: no family at bedside  Disposition Plan: remains inpatient, continue CIWA, continue supportive/conservative management for pancreatitis.     Consultants:  None   Procedures:  See below for x-ray reports   Antibiotics:  None   HPI/Subjective: Afebrile, no CP, no SOB. Continue to experience abd pain and reports dry hiving and nausea. No diarrhea, no appetite.   Objective: Vitals:   12/19/16 1313 12/19/16 2151  BP: (!) 148/96 (!) 161/93  Pulse: 76 93  Resp: 19 20  Temp: 97.8 F (36.6 C) 98.2 F (36.8 C)  SpO2: 100% 100%    Intake/Output Summary (Last 24 hours) at 12/19/16 2227 Last data filed at 12/19/16 0500  Gross per 24 hour  Intake             1420 ml  Output                0 ml  Net             1420 ml   Filed Weights   12/17/16 1315 12/17/16 2043  Weight: 56 kg (123 lb 9 oz) 58.3 kg (128 lb 9.6 oz)    Exam:   General:  Afebrile, reports no CP, no vomiting or diarrhea. Still with intermittent nausea, dry hiving and mid abd/LUQ pain.  Cardiovascular: S1 and S2, no rubs, no gallops  Respiratory: good air movement, no wheezing, no crackles. Normal resp effort  Abdomen: tender to palpation on his mid abd abd; positive BS, no guarding.   Musculoskeletal: no edema, no cyanosis, no clubbing.  Data Reviewed: Basic Metabolic Panel:  Recent Labs Lab 12/17/16 1640 12/18/16 0534 12/18/16 0855 12/19/16 0349  NA 137 135  --  138  K 5.0 3.2*  --  3.6  CL 103 105  --  108  CO2 22 25  --  24  GLUCOSE 76 88  --  90  BUN 7 <5*  --  <5*  CREATININE 0.66 0.53*  --  0.60*  CALCIUM 9.0 8.0*  --  8.3*  MG  --   --  1.6* 1.9  PHOS  --   --   --  3.1   Liver Function Tests:  Recent Labs Lab 12/17/16 1640 12/18/16 0534  AST 61* 21  ALT 47 28  ALKPHOS 109 93  BILITOT 1.7* 1.1  PROT 7.7 6.3*  ALBUMIN 3.9 3.0*    Recent Labs Lab 12/17/16 1640  LIPASE 178*   CBC:  Recent Labs Lab 12/17/16 1640  WBC 16.3*  NEUTROABS 12.4*  HGB 11.3*  HCT 33.9*  MCV 83.7  PLT 321   BNP (last 3 results)  Recent Labs  07/03/16 1314  BNP 52.9    Studies: No results found.  Scheduled  Meds: . enoxaparin (LOVENOX) injection  40 mg Subcutaneous Q24H  . feeding supplement  1 Container Oral TID BM  . folic acid  1 mg Oral Daily  . gabapentin  300 mg Oral TID  . gi cocktail  30 mL Oral BID  . lipase/protease/amylase  24,000 Units Oral TID WC  . loratadine  10 mg Oral Daily  . LORazepam  0-4 mg Intravenous Q12H  . mouth rinse  15 mL Mouth Rinse q12n4p  . metoprolol tartrate  25 mg Oral BID  . multivitamin with minerals  1 tablet Oral Daily  . nicotine  21 mg Transdermal QHS  . pantoprazole (PROTONIX) IV  40 mg Intravenous Q24H  . sertraline  75 mg Oral Daily  . sodium chloride flush  3 mL Intravenous Q12H  . thiamine  100 mg Oral Daily   Or  . thiamine  100 mg Intravenous Daily   Continuous Infusions: . sodium chloride 100 mL/hr at 12/19/16 2041    Active Problems:   TOBACCO ABUSE   MDD (major depressive disorder), recurrent severe, without psychosis ([place])   Alcohol dependence ([place])   Essential hypertension   Malnutrition of moderate degree   Alcohol use disorder, severe, dependence ([place])   Acute on chronic pancreatitis ([place])    Time spent: 30 minutes    Barton Dubois  [facility] Pager [phone]. If 7PM-7AM, please contact night-coverage at [email or web address], password [facility] 12/19/2016, 10:27 PM  LOS: 2 days

## 2016-12-20 LAB — BASIC METABOLIC PANEL
ANION GAP: 6 (ref 5–15)
BUN: <5 mg/dL — ABNORMAL LOW (ref 6–20)
CO2: 24 mmol/L (ref 22–32)
Calcium: 8.5 mg/dL — ABNORMAL LOW (ref 8.9–10.3)
Chloride: 108 mmol/L (ref 101–111)
Creatinine, Ser: 0.51 mg/dL — ABNORMAL LOW (ref 0.61–1.24)
GFR calc Af Amer: >60 mL/min (ref >60)
GFR calc non Af Amer: >60 mL/min (ref >60)
GLUCOSE: 92 mg/dL (ref 65–99)
POTASSIUM: 4 mmol/L (ref 3.5–5.1)
Sodium: 138 mmol/L (ref 135–145)

## 2016-12-20 LAB — CBC
HEMATOCRIT: 29.1 % — AB (ref 39.0–52.0)
Hemoglobin: 9.4 g/dL — ABNORMAL LOW (ref 13.0–17.0)
MCH: 28 pg (ref 26.0–34.0)
MCHC: 32.3 g/dL (ref 30.0–36.0)
MCV: 86.6 fL (ref 78.0–100.0)
Platelets: 227 10*3/uL (ref 150–400)
RBC: 3.36 MIL/uL — ABNORMAL LOW (ref 4.22–5.81)
RDW: 17.9 % — AB (ref 11.5–15.5)
WBC: 8.5 10*3/uL (ref 4.0–10.5)

## 2016-12-20 LAB — LIPASE, BLOOD: Lipase: 161 U/L — ABNORMAL HIGH (ref 11–51)

## 2016-12-20 NOTE — Progress Notes (Signed)
Dr. Dyann Kief paged regarding BP of 178/102. Dilaudid 2mg  IVP given 2 mins prior for 8 out of 10 abdominal pain. Patient lying quietly in bed at this time.

## 2016-12-20 NOTE — Progress Notes (Signed)
[facility] PROGRESS NOTE  [name] [id number]:[id number] DOB: 08/11/1970 DOA: 12/17/2016 PCP: Patient, No Pcp Per  Interim summary and HPI 46 y.o. male with medical history significant of pancreatitis with frequent ED visits and abdominal pain, WPW s/p ablation, HTN, HLD, GERD, CLBP, asthma, anxiety/depression who presents for abdominal pain. 10/10 at it's worst, mid abd and LUQ; radiates to his back and is associated with nausea and vomiting. Patient found with acute on chronic pancreatitis.  Assessment/Plan: 1-acute on chronic pancreatitis -secondary to alcohol consumption -will continue supportive care and symptomatic management: continue IVF, antiemetics and PRN analgesics.  -overall lipase continue trending down  -will continue creon  -continue slowly advancing diet and follow response  -patient remains afebrile  2-ETOH abuse -no acute withdrawal appreciated -will continue CIWA protocol -continue thiamine and folic acid -cessation counseling once again provided; CM/SW to provide resources for outpatient assistance. -patient express interest to quit for good.   3-tobacco abuse -cessation counseling provided -continue nicotine patch  4-hypokalemia -due to GI loses (vomiting prior to admission and with 2 episodes of diarrhea) -continue repletion as needed -WNL currently   5-hypomagnesemia -most likely associated with ETOH abuse -repleted and stable -will monitor trend intermittently and replete further as needed   6-HTN -stable and well controlled -continue metoprolol   7-depression -will continue sertraline  -no SI or hallucinations   8-severe protein calorie malnutrition -in context of acute illness  -will follow nutritional service rec's -started on feeding supplements.  7-leukocytosis -appears to be secondary to stress demargination -no fever and no focal signs of infection -WBC's back to normal with IVF's -will monitor  Code Status:  Full Family Communication: no family at bedside  Disposition Plan: remains inpatient, continue CIWA, continue supportive/conservative management for pancreatitis. Will slowly advance diet. Home in 1-2 days.   Consultants:  None   Procedures:  See below for x-ray reports   Antibiotics:  None   HPI/Subjective: Afebrile, no CP, no SOB and reported no further diarrhea. Still with intermittent nausea and dry hiving; also with persistent abd pain, even improved.   Objective: Vitals:   12/19/16 2151 12/20/16 0605  BP: (!) 161/93 (!) 146/96  Pulse: 93 76  Resp: 20 19  Temp: 98.2 F (36.8 C) (!) 97.3 F (36.3 C)  SpO2: 100% 100%    Intake/Output Summary (Last 24 hours) at 12/20/16 1033 Last data filed at 12/20/16 0903  Gross per 24 hour  Intake             2052 ml  Output             1175 ml  Net              877 ml   Filed Weights   12/17/16 1315 12/17/16 2043  Weight: 56 kg (123 lb 9 oz) 58.3 kg (128 lb 9.6 oz)    Exam:   General: afebrile, no CP, no SOB. Reports some improvement in his abd pain and reported no further diarrhea. Still have some intermittent nausea and dry hiving.   Cardiovascular: S1 and S2, no rubs, no gallops, no rubs  Respiratory: CTA bilaterally; normal resp effort, no using accessory muscles.   Abdomen: pain in mid abd and LUQ area with deep palpation; normal BS, no guarding, no masses.    Musculoskeletal: no edema, no cyanosis, no clubbing.  Data Reviewed: Basic Metabolic Panel:  Recent Labs Lab 12/17/16 1640 12/18/16 0534 12/18/16 0855 12/19/16 0349 12/20/16 0546  NA 137 135  --  138 138  K 5.0 3.2*  --  3.6 4.0  CL 103 105  --  108 108  CO2 22 25  --  24 24  GLUCOSE 76 88  --  90 92  BUN 7 <5*  --  <5* <5*  CREATININE 0.66 0.53*  --  0.60* 0.51*  CALCIUM 9.0 8.0*  --  8.3* 8.5*  MG  --   --  1.6* 1.9  --   PHOS  --   --   --  3.1  --    Liver Function Tests:  Recent Labs Lab 12/17/16 1640 12/18/16 0534  AST 61* 21   ALT 47 28  ALKPHOS 109 93  BILITOT 1.7* 1.1  PROT 7.7 6.3*  ALBUMIN 3.9 3.0*    Recent Labs Lab 12/17/16 1640 12/20/16 0546  LIPASE 178* 161*   CBC:  Recent Labs Lab 12/17/16 1640 12/20/16 0546  WBC 16.3* 8.5  NEUTROABS 12.4*  --   HGB 11.3* 9.4*  HCT 33.9* 29.1*  MCV 83.7 86.6  PLT 321 227   BNP (last 3 results)  Recent Labs  07/03/16 1314  BNP 52.9    Studies: No results found.  Scheduled Meds: . enoxaparin (LOVENOX) injection  40 mg Subcutaneous Q24H  . feeding supplement  1 Container Oral TID BM  . folic acid  1 mg Oral Daily  . gabapentin  300 mg Oral TID  . gi cocktail  30 mL Oral BID  . lipase/protease/amylase  24,000 Units Oral TID WC  . loratadine  10 mg Oral Daily  . LORazepam  0-4 mg Intravenous Q12H  . mouth rinse  15 mL Mouth Rinse q12n4p  . metoprolol tartrate  25 mg Oral BID  . multivitamin with minerals  1 tablet Oral Daily  . nicotine  21 mg Transdermal QHS  . pantoprazole (PROTONIX) IV  40 mg Intravenous Q24H  . sertraline  75 mg Oral Daily  . sodium chloride flush  3 mL Intravenous Q12H  . thiamine  100 mg Oral Daily   Or  . thiamine  100 mg Intravenous Daily   Continuous Infusions: . sodium chloride 100 mL/hr at 12/20/16 0610    Active Problems:   TOBACCO ABUSE   MDD (major depressive disorder), recurrent severe, without psychosis ([place])   Alcohol dependence ([place])   Essential hypertension   Malnutrition of moderate degree   Alcohol use disorder, severe, dependence ([place])   Acute on chronic pancreatitis ([place])    Time spent: 25 minutes    Barton Dubois  [facility] Pager [phone]. If 7PM-7AM, please contact night-coverage at [email or web address], password [facility] 12/20/2016, 10:33 AM  LOS: 3 days

## 2016-12-21 MED ORDER — ENSURE ENLIVE PO LIQD
237.0000 mL | Freq: Two times a day (BID) | ORAL | Status: DC
  Administered 2016-12-21: 237 mL via ORAL

## 2016-12-21 MED ORDER — PANTOPRAZOLE SODIUM 40 MG PO TBEC
40.0000 mg | DELAYED_RELEASE_TABLET | Freq: Every day | ORAL | Status: DC
  Administered 2016-12-21 – 2016-12-28 (×8): 40 mg via ORAL
  Filled 2016-12-21 (×8): qty 1

## 2016-12-21 MED ORDER — IBUPROFEN 400 MG PO TABS
400.0000 mg | ORAL_TABLET | Freq: Four times a day (QID) | ORAL | Status: DC | PRN
  Administered 2016-12-21 – 2016-12-26 (×10): 400 mg via ORAL
  Filled 2016-12-21 (×12): qty 1

## 2016-12-21 NOTE — Progress Notes (Signed)
Nutrition Follow-up  DOCUMENTATION CODES:   Severe malnutrition in context of acute illness/injury  INTERVENTION:   -D/c Boost Breeze po TID, each supplement provides 250 kcal and 9 grams of protein -Ensure Enlive po BID, each supplement provides 350 kcal and 20 grams of protein  NUTRITION DIAGNOSIS:   Malnutrition related to acute illness as evidenced by energy intake < or equal to 50% for > or equal to 5 days, moderate depletion of body fat, moderate depletions of muscle mass.  Ongoing  GOAL:   Patient will meet greater than or equal to 90% of their needs  Progressing   MONITOR:   PO intake, Supplement acceptance, Labs, Weight trends  REASON FOR ASSESSMENT:   Malnutrition Screening Tool    ASSESSMENT:   46 y.o. male with medical history significant of pancreatitis with frequent ED visits and abdominal pain, WPW s/p ablation, HTN, HLD, GERD, CLBP, asthma, etoh abuse, anxiety/depression who presents for abdominal pain. 10/10 at it's worst, mid abd and LUQ; radiates to his back and is associated with nausea and vomiting. Patient found with acute on chronic pancreatitis.  Pt sleeping soundly at time of visit. RD did not disturb.   Pt advanced to a full liquid diet yesterday AM; tolerating well. Noted meal completion 100%. Pt tolerating Boost Breeze supplements, however, will order Ensure Enlive related to diet advancement and increased nutrient density (pt with malnutrition).   Labs reviewed.   Diet Order:  Diet full liquid Room service appropriate? Yes; Fluid consistency: Thin  Skin:  Reviewed, no issues  Last BM:  12/18/16  Height:   Ht Readings from Last 1 Encounters:  12/17/16 5\' 9"  (1.753 m)    Weight:   Wt Readings from Last 1 Encounters:  12/17/16 128 lb 9.6 oz (58.3 kg)    Ideal Body Weight:  72.7 kg  BMI:  Body mass index is 18.99 kg/m.  Estimated Nutritional Needs:   Kcal:  1800-2000  Protein:  85-100 grams  Fluid:  1.8-2.0 L  EDUCATION  NEEDS:   No education needs identified at this time  Adaliah Hiegel A. Jimmye Norman, RD, LDN, CDE Pager: [phone] After hours Pager: [phone]

## 2016-12-21 NOTE — Progress Notes (Signed)
  PROGRESS NOTE  [name] [id number]:[id number] DOB: [birth year] DOA: 12/17/2016 PCP: Patient, No Pcp Per  Brief Narrative: 46 year old man PMH chronic pancreatitis, alcohol abuse, presented with abdominal pain. Admitted for acute on chronic pancreatitis.  Assessment/Plan Acute on chronic pancreatitis secondary to alcohol use. -remains symptomatic with significant pain.make nothing by mouth except for ice chips. -lipase, BMP in AM -Creon with food  Alcohol abuse. -No evidence of withdrawal. Continue CIWA, thiamine, folic acid  Tobacco use disorder. Cessation recommended. Continue nicotine patch.  Severe protein calorie malnutrition the context of chronic illness. -continue Ensure  DVT prophylaxis: enoxaparin Code Status: full Family Communication: none Disposition Plan: home    Murray Hodgkins, MD  [facility] Direct contact: [phone] --[address]  --[email or web address]; password [email or web address]  7PM-7AM contact night coverage as above 12/21/2016, 3:04 PM  LOS: 4 days   Consultants:    Procedures:    Antimicrobials:    Interval history/Subjective: Continues to have severe abdominal pain intermittently, especially withliquid intake.  Objective: Vitals: afebrile, 98.6, 18, 79, 144/106, 100% on room air  Exam:     Constitutional: appears calm, mildly uncomfortable. Nontoxic.  Respiratory. Clear to auscultation bilaterally. No wheezes, rales or rhonchi. Normal respiratory effort.  Cardiovascular. Regular rate and rhythm. No murmur, or gallop. No lower extremity edema.  Abdomen soft, nondistended, mild to moderate epigastric abdominal pain.  Psychiatric. Grossly normal mood and affect. Speech fluent and appropriate.   I have personally reviewed the following:   Labs:  No labs today. Lipase was trending down yesterday and basic metabolic panel as unremarkable. Most recent hepatic function panel 8/12 was unremarkable.  Leukocytosis resolved 8/14.  Hemoglobin stable, 9.4.  Review and summation of old records:  Hospitalized 7/30-7/31. Left AGAINST MEDICAL ADVICE.hospitalized for acute on chronic pancreatitis. Discharge summary makes note of narcotic seeking behavior, persistently asking for IV pain medication and also noted concerns nursing staff had about additional personal medications and substances in his possession while in the hospital.  ED visit 7/31. In the waiting room the patient was urinating on the floor, knocking over signs and drinking alcohol. He was escorted from the premises.  Scheduled Meds: . enoxaparin (LOVENOX) injection  40 mg Subcutaneous Q24H  . feeding supplement (ENSURE ENLIVE)  237 mL Oral BID BM  . folic acid  1 mg Oral Daily  . gabapentin  300 mg Oral TID  . gi cocktail  30 mL Oral BID  . lipase/protease/amylase  24,000 Units Oral TID WC  . loratadine  10 mg Oral Daily  . mouth rinse  15 mL Mouth Rinse q12n4p  . metoprolol tartrate  25 mg Oral BID  . multivitamin with minerals  1 tablet Oral Daily  . nicotine  21 mg Transdermal QHS  . pantoprazole  40 mg Oral QHS  . sertraline  75 mg Oral Daily  . sodium chloride flush  3 mL Intravenous Q12H  . thiamine  100 mg Oral Daily   Continuous Infusions: . sodium chloride 100 mL/hr at 12/21/16 0222    Active Problems:   TOBACCO ABUSE   MDD (major depressive disorder), recurrent severe, without psychosis ([place])   Alcohol dependence ([place])   Essential hypertension   Malnutrition of moderate degree   Alcohol use disorder, severe, dependence ([name])   Acute on chronic pancreatitis ([place])   LOS: 4 days

## 2016-12-22 LAB — BASIC METABOLIC PANEL
ANION GAP: 6 (ref 5–15)
BUN: <5 mg/dL — ABNORMAL LOW (ref 6–20)
CHLORIDE: 106 mmol/L (ref 101–111)
CO2: 29 mmol/L (ref 22–32)
Calcium: 8.8 mg/dL — ABNORMAL LOW (ref 8.9–10.3)
Creatinine, Ser: 0.51 mg/dL — ABNORMAL LOW (ref 0.61–1.24)
GFR calc non Af Amer: >60 mL/min (ref >60)
Glucose, Bld: 72 mg/dL (ref 65–99)
POTASSIUM: 4.2 mmol/L (ref 3.5–5.1)
Sodium: 141 mmol/L (ref 135–145)

## 2016-12-22 LAB — LIPASE, BLOOD: Lipase: 113 U/L — ABNORMAL HIGH (ref 11–51)

## 2016-12-22 MED ORDER — MENTHOL 3 MG MT LOZG: 1.0000 | LOZENGE | OROMUCOSAL | Status: DC | PRN

## 2016-12-22 MED ORDER — LORAZEPAM 2 MG/ML IJ SOLN
0.5000 mg | Freq: Four times a day (QID) | INTRAMUSCULAR | Status: DC | PRN
Start: 2016-12-22 — End: 2016-12-24
  Administered 2016-12-22 – 2016-12-24 (×8): 0.5 mg via INTRAVENOUS
  Filled 2016-12-22 (×8): qty 1

## 2016-12-22 MED ORDER — LORAZEPAM 2 MG/ML IJ SOLN
1.0000 mg | Freq: Four times a day (QID) | INTRAMUSCULAR | Status: DC | PRN
Start: 2016-12-22 — End: 2016-12-22

## 2016-12-22 MED ORDER — LORAZEPAM 2 MG/ML IJ SOLN
0.5000 mg | Freq: Once | INTRAMUSCULAR | Status: AC
  Administered 2016-12-22: 0.5 mg via INTRAVENOUS
  Filled 2016-12-22: qty 1

## 2016-12-22 MED ORDER — HYDRALAZINE HCL 20 MG/ML IJ SOLN
5.0000 mg | INTRAMUSCULAR | Status: DC | PRN
  Administered 2016-12-22 – 2016-12-25 (×8): 5 mg via INTRAVENOUS
  Filled 2016-12-22 (×9): qty 1

## 2016-12-22 NOTE — Progress Notes (Signed)
Pt admitted drinking a cup of coffee at this time, Educated pt on current NPO diet. Pt verbalizes understanding.

## 2016-12-22 NOTE — Clinical Social Work Note (Signed)
CSW received call from pt stating he needs CSW to contact [facility], letting them know he is in the hospital. [place] empowered pt to call [facility] himself to update them. CSW offered to give pt a letter verifying his hospital stay upon discharge. Pt unsure of d/c day. CSW will update weekend CSW to provide letter upon dischagre. CSW signing off as no further Social Work needs identified.   Oretha Ellis, [place], [place] Work [phone]

## 2016-12-22 NOTE — Progress Notes (Signed)
Pt very anxious, shaky and restless. BP was 169/75, HR at 115. Notified doctor on call. Pt put on cardiac monitor and Ativan given. Pt asleep at this time. Will monitor pt.

## 2016-12-22 NOTE — Progress Notes (Signed)
PROGRESS NOTE    [name]  WLN:[id number] DOB: 1971-03-09 DOA: 12/17/2016  PCP: Patient, No Pcp Per   Brief Narrative:    Brief Narrative: 46 year old man PMH chronic pancreatitis, alcohol abuse, presented with abdominal pain. Admitted for acute on chronic pancreatitis.  Assessment/Plan Acute on chronic pancreatitis secondary to alcohol use. - still with LUQ pain and tenderness, overall better - advance diet to clear liquids - keep on analgesia as needed - lipase in AM - keep on IVF for now  Alcohol abuse, transaminitis - continue CIWA - so far no evidence of withdrawal  - CMET in AM  Tobacco use disorder - cessation consultation provided   Severe protein calorie malnutrition  - in the context of chronic illness - continue Ensure  Hypomagnesemia - supplemented, check in AM  Leukocytosis - reactive - resolved  Hypertension, accelerated - add Hydralazine as needed for now   DVT prophylaxis: Lovenox SQ Code Status: Full  Family Communication: Patient at bedside  Disposition Plan: home in 1-2 days   Consultants:   None  Procedures:   None  Antimicrobials:   None  Subjective: Still with LUQ pain but overall better.   Objective: Vitals:   12/21/16 2007 12/22/16 0000 12/22/16 0300 12/22/16 0536  BP: (!) 172/94 (!) 158/90  (!) 172/98  Pulse: 80 78  74  Resp: 18  20 18   Temp: 98 F (36.7 C)  98 F (36.7 C) 98.4 F (36.9 C)  TempSrc: Oral   Oral  SpO2: 100%   99%  Weight:      Height:        Intake/Output Summary (Last 24 hours) at 12/22/16 0848 Last data filed at 12/22/16 0540  Gross per 24 hour  Intake              250 ml  Output              400 ml  Net             -150 ml   Filed Weights   12/17/16 1315 12/17/16 2043  Weight: 56 kg (123 lb 9 oz) 58.3 kg (128 lb 9.6 oz)    Examination:  General exam: Appears calm and comfortable  Respiratory system: Clear to auscultation. Respiratory effort  normal. Cardiovascular system: S1 & S2 heard, RRR. No JVD, murmurs, rubs, gallops or clicks. No pedal edema. Gastrointestinal system: Abdomen is nondistended, soft, mild TTP in LUQ area, no guarding Central nervous system: Alert and oriented. No focal neurological deficits. Extremities: Symmetric 5 x 5 power. Skin: No rashes, lesions or ulcers Psychiatry: Judgement and insight appear normal. Mood & affect appropriate.    Data Reviewed: I have personally reviewed following labs and imaging studies  CBC:  Recent Labs Lab 12/17/16 1640 12/20/16 0546  WBC 16.3* 8.5  NEUTROABS 12.4*  --   HGB 11.3* 9.4*  HCT 33.9* 29.1*  MCV 83.7 86.6  PLT 321 740   Basic Metabolic Panel:  Recent Labs Lab 12/17/16 1640 12/18/16 0534 12/18/16 0855 12/19/16 0349 12/20/16 0546 12/22/16 0458  NA 137 135  --  138 138 141  K 5.0 3.2*  --  3.6 4.0 4.2  CL 103 105  --  108 108 106  CO2 22 25  --  24 24 29   GLUCOSE 76 88  --  90 92 72  BUN 7 <5*  --  <5* <5* <5*  CREATININE 0.66 0.53*  --  0.60* 0.51* 0.51*  CALCIUM  9.0 8.0*  --  8.3* 8.5* 8.8*  MG  --   --  1.6* 1.9  --   --   PHOS  --   --   --  3.1  --   --    Liver Function Tests:  Recent Labs Lab 12/17/16 1640 12/18/16 0534  AST 61* 21  ALT 47 28  ALKPHOS 109 93  BILITOT 1.7* 1.1  PROT 7.7 6.3*  ALBUMIN 3.9 3.0*    Recent Labs Lab 12/17/16 1640 12/20/16 0546 12/22/16 0458  LIPASE 178* 161* 113*   Urine analysis:    Component Value Date/Time   COLORURINE YELLOW 12/17/2016 [place] 12/17/2016 1719   LABSPEC 1.023 12/17/2016 1719   PHURINE 5.0 12/17/2016 1719   GLUCOSEU NEGATIVE 12/17/2016 1719   HGBUR NEGATIVE 12/17/2016 1719   HGBUR negative 04/30/2010 1020   BILIRUBINUR NEGATIVE 12/17/2016 1719   KETONESUR NEGATIVE 12/17/2016 1719   PROTEINUR NEGATIVE 12/17/2016 1719   UROBILINOGEN 0.2 03/15/2015 0508   NITRITE NEGATIVE 12/17/2016 1719   LEUKOCYTESUR NEGATIVE 12/17/2016 1719    Radiology  Studies: No results found.  Scheduled Meds: . enoxaparin (LOVENOX) injection  40 mg Subcutaneous Q24H  . folic acid  1 mg Oral Daily  . gabapentin  300 mg Oral TID  . gi cocktail  30 mL Oral BID  . lipase/protease/amylase  24,000 Units Oral TID WC  . loratadine  10 mg Oral Daily  . mouth rinse  15 mL Mouth Rinse q12n4p  . metoprolol tartrate  25 mg Oral BID  . multivitamin with minerals  1 tablet Oral Daily  . nicotine  21 mg Transdermal QHS  . pantoprazole  40 mg Oral QHS  . sertraline  75 mg Oral Daily  . sodium chloride flush  3 mL Intravenous Q12H  . thiamine  100 mg Oral Daily   Continuous Infusions: . sodium chloride 150 mL/hr at 12/22/16 0517     LOS: 5 days   Time spent: 35 minutes   Faye Ramsay, MD [facility] Pager [phone]  If 7PM-7AM, please contact night-coverage [email or web address] Password [facility] 12/22/2016, 8:48 AM

## 2016-12-23 LAB — CBC
HEMATOCRIT: 26.9 % — AB (ref 39.0–52.0)
Hemoglobin: 8.6 g/dL — ABNORMAL LOW (ref 13.0–17.0)
MCH: 27.5 pg (ref 26.0–34.0)
MCHC: 32 g/dL (ref 30.0–36.0)
MCV: 85.9 fL (ref 78.0–100.0)
PLATELETS: 223 10*3/uL (ref 150–400)
RBC: 3.13 MIL/uL — AB (ref 4.22–5.81)
RDW: 18.5 % — ABNORMAL HIGH (ref 11.5–15.5)
WBC: 9.2 10*3/uL (ref 4.0–10.5)

## 2016-12-23 LAB — COMPREHENSIVE METABOLIC PANEL
ALT: 21 U/L (ref 17–63)
ANION GAP: 6 (ref 5–15)
AST: 27 U/L (ref 15–41)
Albumin: 2.8 g/dL — ABNORMAL LOW (ref 3.5–5.0)
Alkaline Phosphatase: 96 U/L (ref 38–126)
BUN: <5 mg/dL — ABNORMAL LOW (ref 6–20)
CALCIUM: 8.4 mg/dL — AB (ref 8.9–10.3)
CHLORIDE: 107 mmol/L (ref 101–111)
CO2: 27 mmol/L (ref 22–32)
Creatinine, Ser: 0.59 mg/dL — ABNORMAL LOW (ref 0.61–1.24)
GFR calc Af Amer: >60 mL/min (ref >60)
GFR calc non Af Amer: >60 mL/min (ref >60)
Glucose, Bld: 88 mg/dL (ref 65–99)
Potassium: 3.5 mmol/L (ref 3.5–5.1)
SODIUM: 140 mmol/L (ref 135–145)
Total Bilirubin: 0.5 mg/dL (ref 0.3–1.2)
Total Protein: 5.8 g/dL — ABNORMAL LOW (ref 6.5–8.1)

## 2016-12-23 LAB — LIPASE, BLOOD: Lipase: 117 U/L — ABNORMAL HIGH (ref 11–51)

## 2016-12-23 LAB — MAGNESIUM: Magnesium: 1.5 mg/dL — ABNORMAL LOW (ref 1.7–2.4)

## 2016-12-23 MED ORDER — MAGNESIUM SULFATE 2 GM/50ML IV SOLN
2.0000 g | Freq: Once | INTRAVENOUS | Status: AC
  Administered 2016-12-23: 2 g via INTRAVENOUS
  Filled 2016-12-23: qty 50

## 2016-12-23 NOTE — Progress Notes (Signed)
PROGRESS NOTE   [name]  [id number] DOB: [birth year] DOA: 12/17/2016  PCP: Patient, No Pcp Per   Brief Narrative:  46 year old man PMH chronic pancreatitis, alcohol abuse, presented with abdominal pain. Admitted for acute on chronic pancreatitis.  Assessment/Plan Acute on chronic pancreatitis secondary to alcohol use. - still with LUQ pain and tenderness, overall better  - tolerated clear liquid diet - advance to full liquid today  - provide analgesia as needed  - lipase in AM  Alcohol abuse, transaminitis - no evidence of withdrawal - keep on CIWA  Tobacco use disorder - consultation provided   Severe protein calorie malnutrition  - cont Ensure   Hypomagnesemia - still low, will supplement and repeat Mg in AM  Leukocytosis - resolved   Hypertension, accelerated - keep on Hydralazine as needed    DVT prophylaxis: Lovenox SQ Code Status: Full  Family Communication: Patient at bedside  Disposition Plan: home in few days when lipase better   Consultants:   None  Procedures:   None  Antimicrobials:   None  Subjective: Still with pain but overall better.   Objective: Vitals:   12/22/16 2032 12/23/16 0005 12/23/16 0200 12/23/16 0552  BP: (!) 165/103 (!) 169/101 (!) 143/100 (!) 150/97  Pulse: 83 95 79 81  Resp: 19   19  Temp: 98.6 F (37 C)   97.8 F (36.6 C)  TempSrc: Oral     SpO2: 100%   100%  Weight:      Height:        Intake/Output Summary (Last 24 hours) at 12/23/16 0921 Last data filed at 12/23/16 0911  Gross per 24 hour  Intake             1450 ml  Output              500 ml  Net              950 ml   Filed Weights   12/17/16 1315 12/17/16 2043  Weight: 56 kg (123 lb 9 oz) 58.3 kg (128 lb 9.6 oz)   Physical Exam  Constitutional: Appears well-developed and well-nourished. No distress. .  CVS: RRR, S1/S2 +, no murmurs, no gallops, no carotid bruit.  Pulmonary: Effort and breath sounds normal, no stridor, rhonchi,  wheezes, rales.  Abdominal: Soft. BS +,  no distension, tenderness in LUQ area is rather mild  Musculoskeletal: Normal range of motion. No edema and no tenderness.  Lymphadenopathy: No lymphadenopathy noted, cervical, inguinal. Neuro: Alert. Normal reflexes, muscle tone coordination. No cranial nerve deficit.  Data Reviewed: I have personally reviewed following labs and imaging studies  CBC:  Recent Labs Lab 12/17/16 1640 12/20/16 0546 12/23/16 0454  WBC 16.3* 8.5 9.2  NEUTROABS 12.4*  --   --   HGB 11.3* 9.4* 8.6*  HCT 33.9* 29.1* 26.9*  MCV 83.7 86.6 85.9  PLT 321 227 269   Basic Metabolic Panel:  Recent Labs Lab 12/18/16 0534 12/18/16 0855 12/19/16 0349 12/20/16 0546 12/22/16 0458 12/23/16 0454  NA 135  --  138 138 141 140  K 3.2*  --  3.6 4.0 4.2 3.5  CL 105  --  108 108 106 107  CO2 25  --  24 24 29 27   GLUCOSE 88  --  90 92 72 88  BUN <5*  --  <5* <5* <5* <5*  CREATININE 0.53*  --  0.60* 0.51* 0.51* 0.59*  CALCIUM 8.0*  --  8.3* 8.5* 8.8* 8.4*  MG  --  1.6* 1.9  --   --  1.5*  PHOS  --   --  3.1  --   --   --    Liver Function Tests:  Recent Labs Lab 12/17/16 1640 12/18/16 0534 12/23/16 0454  AST 61* 21 27  ALT 47 28 21  ALKPHOS 109 93 96  BILITOT 1.7* 1.1 0.5  PROT 7.7 6.3* 5.8*  ALBUMIN 3.9 3.0* 2.8*    Recent Labs Lab 12/17/16 1640 12/20/16 0546 12/22/16 0458 12/23/16 0454  LIPASE 178* 161* 113* 117*   Urine analysis:    Component Value Date/Time   COLORURINE YELLOW 12/17/2016 [place] 12/17/2016 1719   LABSPEC 1.023 12/17/2016 1719   PHURINE 5.0 12/17/2016 1719   GLUCOSEU NEGATIVE 12/17/2016 1719   HGBUR NEGATIVE 12/17/2016 1719   HGBUR negative 04/30/2010 1020   BILIRUBINUR NEGATIVE 12/17/2016 1719   KETONESUR NEGATIVE 12/17/2016 1719   PROTEINUR NEGATIVE 12/17/2016 1719   UROBILINOGEN 0.2 03/15/2015 0508   NITRITE NEGATIVE 12/17/2016 1719   LEUKOCYTESUR NEGATIVE 12/17/2016 1719    Radiology Studies: No  results found.  Scheduled Meds: . enoxaparin (LOVENOX) injection  40 mg Subcutaneous Q24H  . folic acid  1 mg Oral Daily  . gabapentin  300 mg Oral TID  . gi cocktail  30 mL Oral BID  . lipase/protease/amylase  24,000 Units Oral TID WC  . loratadine  10 mg Oral Daily  . mouth rinse  15 mL Mouth Rinse q12n4p  . metoprolol tartrate  25 mg Oral BID  . multivitamin with minerals  1 tablet Oral Daily  . nicotine  21 mg Transdermal QHS  . pantoprazole  40 mg Oral QHS  . sertraline  75 mg Oral Daily  . sodium chloride flush  3 mL Intravenous Q12H  . thiamine  100 mg Oral Daily   Continuous Infusions: . sodium chloride 100 mL/hr at 12/23/16 0001     LOS: 6 days   Time spent: 35 minutes   Faye Ramsay, MD [facility] Pager [phone]  If 7PM-7AM, please contact night-coverage [email or web address] Password [email or web address] 12/23/2016, 9:21 AM

## 2016-12-24 LAB — COMPREHENSIVE METABOLIC PANEL
ALBUMIN: 2.9 g/dL — AB (ref 3.5–5.0)
ALT: 17 U/L (ref 17–63)
ANION GAP: 7 (ref 5–15)
AST: 18 U/L (ref 15–41)
Alkaline Phosphatase: 89 U/L (ref 38–126)
BUN: <5 mg/dL — ABNORMAL LOW (ref 6–20)
CHLORIDE: 107 mmol/L (ref 101–111)
CO2: 26 mmol/L (ref 22–32)
Calcium: 8.6 mg/dL — ABNORMAL LOW (ref 8.9–10.3)
Creatinine, Ser: 0.56 mg/dL — ABNORMAL LOW (ref 0.61–1.24)
GFR calc Af Amer: >60 mL/min (ref >60)
GFR calc non Af Amer: >60 mL/min (ref >60)
GLUCOSE: 76 mg/dL (ref 65–99)
POTASSIUM: 3.6 mmol/L (ref 3.5–5.1)
SODIUM: 140 mmol/L (ref 135–145)
Total Bilirubin: 0.3 mg/dL (ref 0.3–1.2)
Total Protein: 6.1 g/dL — ABNORMAL LOW (ref 6.5–8.1)

## 2016-12-24 LAB — URINALYSIS, ROUTINE W REFLEX MICROSCOPIC
BILIRUBIN URINE: NEGATIVE
Glucose, UA: NEGATIVE mg/dL
Hgb urine dipstick: NEGATIVE
Ketones, ur: NEGATIVE mg/dL
LEUKOCYTES UA: NEGATIVE
NITRITE: NEGATIVE
Protein, ur: NEGATIVE mg/dL
SPECIFIC GRAVITY, URINE: 1.002 — AB (ref 1.005–1.030)
pH: 7 (ref 5.0–8.0)

## 2016-12-24 LAB — CBC
HCT: 26.4 % — ABNORMAL LOW (ref 39.0–52.0)
Hemoglobin: 8.5 g/dL — ABNORMAL LOW (ref 13.0–17.0)
MCH: 27.5 pg (ref 26.0–34.0)
MCHC: 32.2 g/dL (ref 30.0–36.0)
MCV: 85.4 fL (ref 78.0–100.0)
Platelets: 244 10*3/uL (ref 150–400)
RBC: 3.09 MIL/uL — ABNORMAL LOW (ref 4.22–5.81)
RDW: 18.4 % — AB (ref 11.5–15.5)
WBC: 9.1 10*3/uL (ref 4.0–10.5)

## 2016-12-24 LAB — MAGNESIUM: Magnesium: 1.6 mg/dL — ABNORMAL LOW (ref 1.7–2.4)

## 2016-12-24 LAB — LIPASE, BLOOD: Lipase: 150 U/L — ABNORMAL HIGH (ref 11–51)

## 2016-12-24 MED ORDER — MAGNESIUM SULFATE 2 GM/50ML IV SOLN
2.0000 g | Freq: Once | INTRAVENOUS | Status: AC
  Administered 2016-12-24: 2 g via INTRAVENOUS
  Filled 2016-12-24: qty 50

## 2016-12-24 MED ORDER — LORAZEPAM 2 MG/ML IJ SOLN
0.5000 mg | Freq: Four times a day (QID) | INTRAMUSCULAR | Status: DC | PRN
  Administered 2016-12-24: 1 mg via INTRAVENOUS
  Administered 2016-12-24: 0.5 mg via INTRAVENOUS
  Administered 2016-12-24: 1 mg via INTRAVENOUS
  Administered 2016-12-25: 0.5 mg via INTRAVENOUS
  Administered 2016-12-25 – 2016-12-29 (×16): 1 mg via INTRAVENOUS
  Filled 2016-12-24 (×20): qty 1

## 2016-12-24 MED ORDER — HYDRALAZINE HCL 10 MG PO TABS
10.0000 mg | ORAL_TABLET | Freq: Three times a day (TID) | ORAL | Status: DC
  Administered 2016-12-24 – 2016-12-26 (×6): 10 mg via ORAL
  Filled 2016-12-24 (×6): qty 1

## 2016-12-24 NOTE — Progress Notes (Addendum)
PROGRESS NOTE   [name]  [id number] DOB: 09/15/1970 DOA: 12/17/2016  PCP: Patient, No Pcp Per   Brief Narrative:  46 year old man PMH chronic pancreatitis, alcohol abuse, presented with abdominal pain. Admitted for acute on chronic pancreatitis.  Assessment/Plan Acute on chronic pancreatitis secondary to alcohol use. - still with pain but overall better  - lipase is still slightly high - keep on full liquid diet - lipase in AM  Urinary urgency and burning - UA requested   Alcohol abuse, transaminitis - no evidence of withdrawal - keep on CIWA  Tobacco use disorder - consultation provided   Severe protein calorie malnutrition  - cont Ensure   Hypomagnesemia - still low, cont to supplement   Anemia of chronic disease - alcohol use - no evidence of active bleeding   Leukocytosis - resolved   Hypertension, accelerated - place on scheduled hydralazine and as needed  - pt already on Metoprolol BID  DVT prophylaxis: Lovenox SQ Code Status: Full  Family Communication: Patient at bedside  Disposition Plan: home in 1-2 days   Consultants:   None  Procedures:   None  Antimicrobials:   None  Subjective: Still with pain but better   Objective: Vitals:   12/23/16 1350 12/23/16 2120 12/23/16 2201 12/24/16 0433  BP: (!) 150/97 (!) 165/109 (!) 158/94 (!) 170/98  Pulse: 79 88  86  Resp: 16 18    Temp: 98.5 F (36.9 C) 98.6 F (37 C)    TempSrc: Oral Oral    SpO2: 100% 100%    Weight:      Height:        Intake/Output Summary (Last 24 hours) at 12/24/16 1035 Last data filed at 12/24/16 0600  Gross per 24 hour  Intake              930 ml  Output             1200 ml  Net             -270 ml   Filed Weights   12/17/16 1315 12/17/16 2043  Weight: 56 kg (123 lb 9 oz) 58.3 kg (128 lb 9.6 oz)   Physical Exam  Constitutional: Appears well-developed and well-nourished. No distress.  CVS: RRR, S1/S2 +, no murmurs, no gallops, no carotid  bruit.  Pulmonary: Effort and breath sounds normal, no stridor, rhonchi, wheezes, rales.  Abdominal: Soft. BS +,  no distension, tenderness noted in LUQ but rather mild, no rebound or guarding.  Musculoskeletal: Normal range of motion. No edema and no tenderness.  Lymphadenopathy: No lymphadenopathy noted, cervical, inguinal. Neuro: Alert. Normal reflexes, muscle tone coordination. No cranial nerve deficit.  Data Reviewed: I have personally reviewed following labs and imaging studies  CBC:  Recent Labs Lab 12/17/16 1640 12/20/16 0546 12/23/16 0454 12/24/16 0643  WBC 16.3* 8.5 9.2 9.1  NEUTROABS 12.4*  --   --   --   HGB 11.3* 9.4* 8.6* 8.5*  HCT 33.9* 29.1* 26.9* 26.4*  MCV 83.7 86.6 85.9 85.4  PLT 321 227 223 440   Basic Metabolic Panel:  Recent Labs Lab 12/18/16 0855 12/19/16 0349 12/20/16 0546 12/22/16 0458 12/23/16 0454 12/24/16 0643  NA  --  138 138 141 140 140  K  --  3.6 4.0 4.2 3.5 3.6  CL  --  108 108 106 107 107  CO2  --  24 24 29 27 26   GLUCOSE  --  90 92 72 88 76  BUN  --  <  5* <5* <5* <5* <5*  CREATININE  --  0.60* 0.51* 0.51* 0.59* 0.56*  CALCIUM  --  8.3* 8.5* 8.8* 8.4* 8.6*  MG 1.6* 1.9  --   --  1.5* 1.6*  PHOS  --  3.1  --   --   --   --    Liver Function Tests:  Recent Labs Lab 12/17/16 1640 12/18/16 0534 12/23/16 0454 12/24/16 0643  AST 61* 21 27 18   ALT 47 28 21 17   ALKPHOS 109 93 96 89  BILITOT 1.7* 1.1 0.5 0.3  PROT 7.7 6.3* 5.8* 6.1*  ALBUMIN 3.9 3.0* 2.8* 2.9*    Recent Labs Lab 12/17/16 1640 12/20/16 0546 12/22/16 0458 12/23/16 0454 12/24/16 0643  LIPASE 178* 161* 113* 117* 150*   Urine analysis:    Component Value Date/Time   COLORURINE YELLOW 12/17/2016 [place] 12/17/2016 1719   LABSPEC 1.023 12/17/2016 1719   PHURINE 5.0 12/17/2016 1719   GLUCOSEU NEGATIVE 12/17/2016 1719   HGBUR NEGATIVE 12/17/2016 1719   HGBUR negative 04/30/2010 1020   BILIRUBINUR NEGATIVE 12/17/2016 1719   KETONESUR  NEGATIVE 12/17/2016 1719   PROTEINUR NEGATIVE 12/17/2016 1719   UROBILINOGEN 0.2 03/15/2015 0508   NITRITE NEGATIVE 12/17/2016 1719   LEUKOCYTESUR NEGATIVE 12/17/2016 1719    Radiology Studies: No results found.  Scheduled Meds: . enoxaparin (LOVENOX) injection  40 mg Subcutaneous Q24H  . folic acid  1 mg Oral Daily  . gabapentin  300 mg Oral TID  . gi cocktail  30 mL Oral BID  . lipase/protease/amylase  24,000 Units Oral TID WC  . loratadine  10 mg Oral Daily  . mouth rinse  15 mL Mouth Rinse q12n4p  . metoprolol tartrate  25 mg Oral BID  . multivitamin with minerals  1 tablet Oral Daily  . nicotine  21 mg Transdermal QHS  . pantoprazole  40 mg Oral QHS  . sertraline  75 mg Oral Daily  . sodium chloride flush  3 mL Intravenous Q12H  . thiamine  100 mg Oral Daily   Continuous Infusions: . sodium chloride 100 mL/hr at 12/24/16 0950     LOS: 7 days   Time spent: 25 minutes   Faye Ramsay, MD [facility] Pager [phone]  If 7PM-7AM, please contact night-coverage [email or web address] Password [facility] 12/24/2016, 10:35 AM

## 2016-12-25 LAB — COMPREHENSIVE METABOLIC PANEL
ALBUMIN: 3 g/dL — AB (ref 3.5–5.0)
ALK PHOS: 97 U/L (ref 38–126)
ALT: 18 U/L (ref 17–63)
AST: 20 U/L (ref 15–41)
Anion gap: 8 (ref 5–15)
BUN: <5 mg/dL — ABNORMAL LOW (ref 6–20)
CALCIUM: 8.7 mg/dL — AB (ref 8.9–10.3)
CHLORIDE: 105 mmol/L (ref 101–111)
CO2: 26 mmol/L (ref 22–32)
CREATININE: 0.58 mg/dL — AB (ref 0.61–1.24)
GFR calc Af Amer: >60 mL/min (ref >60)
GFR calc non Af Amer: >60 mL/min (ref >60)
GLUCOSE: 84 mg/dL (ref 65–99)
Potassium: 3.5 mmol/L (ref 3.5–5.1)
SODIUM: 139 mmol/L (ref 135–145)
Total Bilirubin: 0.5 mg/dL (ref 0.3–1.2)
Total Protein: 6.6 g/dL (ref 6.5–8.1)

## 2016-12-25 LAB — CBC
HCT: 27.6 % — ABNORMAL LOW (ref 39.0–52.0)
Hemoglobin: 8.8 g/dL — ABNORMAL LOW (ref 13.0–17.0)
MCH: 27.1 pg (ref 26.0–34.0)
MCHC: 31.9 g/dL (ref 30.0–36.0)
MCV: 84.9 fL (ref 78.0–100.0)
PLATELETS: 246 10*3/uL (ref 150–400)
RBC: 3.25 MIL/uL — AB (ref 4.22–5.81)
RDW: 18.3 % — AB (ref 11.5–15.5)
WBC: 9.5 10*3/uL (ref 4.0–10.5)

## 2016-12-25 LAB — LIPASE, BLOOD: LIPASE: 132 U/L — AB (ref 11–51)

## 2016-12-25 LAB — MAGNESIUM: Magnesium: 1.6 mg/dL — ABNORMAL LOW (ref 1.7–2.4)

## 2016-12-25 MED ORDER — HYDROMORPHONE HCL 1 MG/ML IJ SOLN
1.0000 mg | Freq: Once | INTRAMUSCULAR | Status: AC
  Administered 2016-12-25: 1 mg via INTRAVENOUS
  Filled 2016-12-25: qty 1

## 2016-12-25 MED ORDER — MAGNESIUM SULFATE 2 GM/50ML IV SOLN
2.0000 g | Freq: Once | INTRAVENOUS | Status: AC
  Administered 2016-12-25: 2 g via INTRAVENOUS
  Filled 2016-12-25: qty 50

## 2016-12-25 NOTE — Progress Notes (Signed)
PROGRESS NOTE   [name]  [id number] DOB: 1971/05/08 DOA: 12/17/2016  PCP: Patient, No Pcp Per   Brief Narrative:  46 year old man PMH chronic pancreatitis, alcohol abuse, presented with abdominal pain. Admitted for acute on chronic pancreatitis.  Assessment/Plan Acute on chronic pancreatitis secondary to alcohol use. - pt reports feeling much better, wants to eat more - lipase is rather slowly trending down but clinically pt improving - will advance diet - lower rate of IVF - lipase in AM  Urinary urgency and burning - UA pending   Alcohol abuse, transaminitis - no evidence of withdrawal   Tobacco use disorder - consultation provided   Severe protein calorie malnutrition  - cont Ensure   Hypomagnesemia - still low so will need to continue to supplement  - Mg in AM  Anemia of chronic disease - alcohol use - no evidence of active bleeding   Leukocytosis - resolved   Hypertension, accelerated - improving   DVT prophylaxis: Lovenox SQ Code Status: Full  Family Communication: Patient at bedside  Disposition Plan: possible d/c in AM  Consultants:   None  Procedures:   None  Antimicrobials:   None  Subjective: Reports feeling better, wants to eat.   Objective: Vitals:   12/24/16 2340 12/25/16 0100 12/25/16 0532 12/25/16 0600  BP: (!) 158/96 (!) 155/96 (!) 165/95 (!) 157/91  Pulse: 96 98  86  Resp:    18  Temp:    98.1 F (36.7 C)  TempSrc:    Oral  SpO2:    98%  Weight:      Height:        Intake/Output Summary (Last 24 hours) at 12/25/16 1220 Last data filed at 12/25/16 0800  Gross per 24 hour  Intake             1562 ml  Output              400 ml  Net             1162 ml   Filed Weights   12/17/16 1315 12/17/16 2043  Weight: 56 kg (123 lb 9 oz) 58.3 kg (128 lb 9.6 oz)   Physical Exam  Constitutional: Appears well-developed and well-nourished. No distress.   CVS: RRR, S1/S2 +, no murmurs, no gallops, no carotid  bruit.  Pulmonary: Effort and breath sounds normal, no stridor, rhonchi, wheezes, rales.  Abdominal: Soft. BS +,  no distension, tenderness, rebound or guarding.  Musculoskeletal: Normal range of motion. No edema and no tenderness.   Data Reviewed: I have personally reviewed following labs and imaging studies  CBC:  Recent Labs Lab 12/20/16 0546 12/23/16 0454 12/24/16 0643 12/25/16 0549  WBC 8.5 9.2 9.1 9.5  HGB 9.4* 8.6* 8.5* 8.8*  HCT 29.1* 26.9* 26.4* 27.6*  MCV 86.6 85.9 85.4 84.9  PLT 227 223 244 595   Basic Metabolic Panel:  Recent Labs Lab 12/19/16 0349 12/20/16 0546 12/22/16 0458 12/23/16 0454 12/24/16 0643 12/25/16 0549  NA 138 138 141 140 140 139  K 3.6 4.0 4.2 3.5 3.6 3.5  CL 108 108 106 107 107 105  CO2 24 24 29 27 26 26   GLUCOSE 90 92 72 88 76 84  BUN <5* <5* <5* <5* <5* <5*  CREATININE 0.60* 0.51* 0.51* 0.59* 0.56* 0.58*  CALCIUM 8.3* 8.5* 8.8* 8.4* 8.6* 8.7*  MG 1.9  --   --  1.5* 1.6* 1.6*  PHOS 3.1  --   --   --   --   --  Liver Function Tests:  Recent Labs Lab 12/23/16 0454 12/24/16 0643 12/25/16 0549  AST 27 18 20   ALT 21 17 18   ALKPHOS 96 89 97  BILITOT 0.5 0.3 0.5  PROT 5.8* 6.1* 6.6  ALBUMIN 2.8* 2.9* 3.0*    Recent Labs Lab 12/20/16 0546 12/22/16 0458 12/23/16 0454 12/24/16 0643 12/25/16 0549  LIPASE 161* 113* 117* 150* 132*   Urine analysis:    Component Value Date/Time   COLORURINE COLORLESS (A) 12/24/2016 1106   APPEARANCEUR CLEAR 12/24/2016 1106   LABSPEC 1.002 (L) 12/24/2016 1106   PHURINE 7.0 12/24/2016 1106   GLUCOSEU NEGATIVE 12/24/2016 1106   HGBUR NEGATIVE 12/24/2016 1106   HGBUR negative 04/30/2010 1020   BILIRUBINUR NEGATIVE 12/24/2016 1106   KETONESUR NEGATIVE 12/24/2016 1106   PROTEINUR NEGATIVE 12/24/2016 1106   UROBILINOGEN 0.2 03/15/2015 0508   NITRITE NEGATIVE 12/24/2016 1106   LEUKOCYTESUR NEGATIVE 12/24/2016 1106    Radiology Studies: No results found.  Scheduled Meds: . enoxaparin  (LOVENOX) injection  40 mg Subcutaneous Q24H  . folic acid  1 mg Oral Daily  . gabapentin  300 mg Oral TID  . gi cocktail  30 mL Oral BID  . hydrALAZINE  10 mg Oral Q8H  . lipase/protease/amylase  24,000 Units Oral TID WC  . loratadine  10 mg Oral Daily  . mouth rinse  15 mL Mouth Rinse q12n4p  . metoprolol tartrate  25 mg Oral BID  . multivitamin with minerals  1 tablet Oral Daily  . nicotine  21 mg Transdermal QHS  . pantoprazole  40 mg Oral QHS  . sertraline  75 mg Oral Daily  . sodium chloride flush  3 mL Intravenous Q12H  . thiamine  100 mg Oral Daily   Continuous Infusions: . sodium chloride 100 mL/hr at 12/25/16 0727     LOS: 8 days   Time spent: 25 minutes   Faye Ramsay, MD [facility] Pager [phone]  If 7PM-7AM, please contact night-coverage [email or web address] Password [email or web address] 12/25/2016, 12:20 PM

## 2016-12-26 ENCOUNTER — Inpatient Hospital Stay (HOSPITAL_COMMUNITY): Payer: Self-pay

## 2016-12-26 LAB — BASIC METABOLIC PANEL
Anion gap: 7 (ref 5–15)
CHLORIDE: 107 mmol/L (ref 101–111)
CO2: 26 mmol/L (ref 22–32)
CREATININE: 0.63 mg/dL (ref 0.61–1.24)
Calcium: 8.6 mg/dL — ABNORMAL LOW (ref 8.9–10.3)
GFR calc non Af Amer: >60 mL/min (ref >60)
GLUCOSE: 93 mg/dL (ref 65–99)
Potassium: 4 mmol/L (ref 3.5–5.1)
Sodium: 140 mmol/L (ref 135–145)

## 2016-12-26 LAB — LIPASE, BLOOD: Lipase: 134 U/L — ABNORMAL HIGH (ref 11–51)

## 2016-12-26 LAB — CBC
HEMATOCRIT: 27.2 % — AB (ref 39.0–52.0)
HEMOGLOBIN: 8.7 g/dL — AB (ref 13.0–17.0)
MCH: 27.4 pg (ref 26.0–34.0)
MCHC: 32 g/dL (ref 30.0–36.0)
MCV: 85.5 fL (ref 78.0–100.0)
Platelets: 243 10*3/uL (ref 150–400)
RBC: 3.18 MIL/uL — ABNORMAL LOW (ref 4.22–5.81)
RDW: 18.2 % — AB (ref 11.5–15.5)
WBC: 7.6 10*3/uL (ref 4.0–10.5)

## 2016-12-26 LAB — MAGNESIUM: MAGNESIUM: 1.8 mg/dL (ref 1.7–2.4)

## 2016-12-26 MED ORDER — HYDRALAZINE HCL 20 MG/ML IJ SOLN
10.0000 mg | INTRAMUSCULAR | Status: DC | PRN
  Administered 2016-12-26: 10 mg via INTRAVENOUS
  Filled 2016-12-26: qty 1

## 2016-12-26 MED ORDER — HYDRALAZINE HCL 25 MG PO TABS
25.0000 mg | ORAL_TABLET | Freq: Three times a day (TID) | ORAL | Status: DC
  Administered 2016-12-26 – 2016-12-29 (×10): 25 mg via ORAL
  Filled 2016-12-26 (×10): qty 1

## 2016-12-26 MED ORDER — BARIUM SULFATE 2.1 % PO SUSP
ORAL | Status: AC
  Administered 2016-12-26: 11:00:00
  Filled 2016-12-26: qty 2

## 2016-12-26 MED ORDER — MAGNESIUM SULFATE 2 GM/50ML IV SOLN
2.0000 g | Freq: Once | INTRAVENOUS | Status: AC
  Administered 2016-12-26: 2 g via INTRAVENOUS
  Filled 2016-12-26: qty 50

## 2016-12-26 MED ORDER — PIPERACILLIN-TAZOBACTAM 3.375 G IVPB
3.3750 g | Freq: Three times a day (TID) | INTRAVENOUS | Status: DC
  Administered 2016-12-26 – 2016-12-27 (×3): 3.375 g via INTRAVENOUS
  Filled 2016-12-26 (×4): qty 50

## 2016-12-26 MED ORDER — KETOROLAC TROMETHAMINE 30 MG/ML IJ SOLN
30.0000 mg | Freq: Four times a day (QID) | INTRAMUSCULAR | Status: DC | PRN
  Administered 2016-12-26 – 2016-12-29 (×12): 30 mg via INTRAVENOUS
  Filled 2016-12-26 (×12): qty 1

## 2016-12-26 NOTE — Progress Notes (Signed)
Pharmacy Antibiotic Note  [name] is a 46 y.o. male admitted on 12/17/2016 with acute on chronic pancreatitis.  Pharmacy has been consulted for zosyn dosing for intra-abdominal infection.  Plan: Zosyn 3.375g IV q8h (4 hour infusion).  Pharmacy to sign off an follow peripherally  Height: 5\' 9"  (175.3 cm) Weight: 128 lb 9.6 oz (58.3 kg) IBW/kg (Calculated) : 70.7  Temp (24hrs), Avg:98.1 F (36.7 C), Min:97.3 F (36.3 C), Max:98.7 F (37.1 C)   Recent Labs Lab 12/20/16 0546 12/22/16 0458 12/23/16 0454 12/24/16 0643 12/25/16 0549 12/26/16 0543  WBC 8.5  --  9.2 9.1 9.5 7.6  CREATININE 0.51* 0.51* 0.59* 0.56* 0.58* 0.63    Estimated Creatinine Clearance: 94.1 mL/min (by C-G formula based on SCr of 0.63 mg/dL).    Allergies  Allergen Reactions  . Shellfish-Derived Products Nausea And Vomiting  . Trazodone And Nefazodone Other (See Comments)    Reaction:  Muscle spasms  . Robaxin [Methocarbamol] Other (See Comments)    "jumpy limbs"  . Contrast Media [Iodinated Diagnostic Agents] Hives  . Reglan [Metoclopramide] Other (See Comments)    Reaction:  Muscle spasms   Thank you for allowing pharmacy to be a part of this patient's care.  Erin Hearing PharmD., BCPS Clinical Pharmacist Pager [phone] 12/26/2016 5:03 PM

## 2016-12-26 NOTE — Progress Notes (Signed)
Pt had a large emesis, partially digested food. PRN zofran given. Will continue to monitor pt.

## 2016-12-26 NOTE — Progress Notes (Signed)
PROGRESS NOTE   [name]  [id number] DOB: Jun 10, 1970 DOA: 12/17/2016  PCP: Patient, No Pcp Per   Brief Narrative:  45 year old man PMH chronic pancreatitis, alcohol abuse, presented with abdominal pain. Admitted for acute on chronic pancreatitis.  Assessment/Plan Acute on chronic pancreatitis secondary to alcohol use. - patient reports feeling better this morning however did have large emesis last night - Backed down on diet, full liquid diet only, continue IV fluids - CT abdomen and pelvis for further evaluation - Lipase in the morning  Urinary urgency and burning - Urinalysis clear  Alcohol abuse, transaminitis - no evidence of withdrawal  Tobacco use disorder - consultation provided   Severe protein calorie malnutrition  - cont Ensure   Hypomagnesemia - supplemented but still on low end of normal, we'll give one more dose of magnesium 2 g IV  Anemia of chronic disease - alcohol use - no evidence of active bleeding  Leukocytosis - reactive, resolved  Hypertension, accelerated - overall improving  DVT prophylaxis: Lovenox SQ Code Status: Full  Family Communication: Patient at bedside  Disposition Plan: possible DC in the morning if patient tolerating diet well  Consultants:   None  Procedures:   None  Antimicrobials:   None  Subjective: Patient has large episode of emesis last night, reports feeling better this morning  Objective: Vitals:   12/25/16 1331 12/25/16 1949 12/25/16 2156 12/26/16 0300  BP: (!) 159/108 (!) 169/109  (!) 155/97  Pulse: 70 89 92 70  Resp: 18  18 18   Temp: 98.6 F (37 C) 98.7 F (37.1 C) 98.5 F (36.9 C) 97.9 F (36.6 C)  TempSrc: Oral  Oral Oral  SpO2: 100% 100% 100% 100%  Weight:      Height:        Intake/Output Summary (Last 24 hours) at 12/26/16 0926 Last data filed at 12/26/16 0447  Gross per 24 hour  Intake          3362.33 ml  Output              800 ml  Net          2562.33 ml    Filed Weights   12/17/16 1315 12/17/16 2043  Weight: 56 kg (123 lb 9 oz) 58.3 kg (128 lb 9.6 oz)   Physical Exam  Constitutional: Appears well-developed and well-nourished. No distress.  CVS: RRR, S1/S2 +, no murmurs, no gallops, no carotid bruit.  Pulmonary: Effort and breath sounds normal, no stridor, rhonchi, wheezes, rales.  Abdominal: Soft. BS +,  no distension, mild tenderness in left upper quadrant area Musculoskeletal: Normal range of motion. No edema and no tenderness.    Data Reviewed: I have personally reviewed following labs and imaging studies  CBC:  Recent Labs Lab 12/20/16 0546 12/23/16 0454 12/24/16 0643 12/25/16 0549 12/26/16 0543  WBC 8.5 9.2 9.1 9.5 7.6  HGB 9.4* 8.6* 8.5* 8.8* 8.7*  HCT 29.1* 26.9* 26.4* 27.6* 27.2*  MCV 86.6 85.9 85.4 84.9 85.5  PLT 227 223 244 246 720   Basic Metabolic Panel:  Recent Labs Lab 12/22/16 0458 12/23/16 0454 12/24/16 0643 12/25/16 0549 12/26/16 0543  NA 141 140 140 139 140  K 4.2 3.5 3.6 3.5 4.0  CL 106 107 107 105 107  CO2 29 27 26 26 26   GLUCOSE 72 88 76 84 93  BUN <5* <5* <5* <5* <5*  CREATININE 0.51* 0.59* 0.56* 0.58* 0.63  CALCIUM 8.8* 8.4* 8.6* 8.7* 8.6*  MG  --  1.5* 1.6* 1.6* 1.8   Liver Function Tests:  Recent Labs Lab 12/23/16 0454 12/24/16 0643 12/25/16 0549  AST 27 18 20   ALT 21 17 18   ALKPHOS 96 89 97  BILITOT 0.5 0.3 0.5  PROT 5.8* 6.1* 6.6  ALBUMIN 2.8* 2.9* 3.0*    Recent Labs Lab 12/22/16 0458 12/23/16 0454 12/24/16 0643 12/25/16 0549 12/26/16 0543  LIPASE 113* 117* 150* 132* 134*   Urine analysis:    Component Value Date/Time   COLORURINE COLORLESS (A) 12/24/2016 1106   APPEARANCEUR CLEAR 12/24/2016 1106   LABSPEC 1.002 (L) 12/24/2016 1106   PHURINE 7.0 12/24/2016 1106   GLUCOSEU NEGATIVE 12/24/2016 1106   HGBUR NEGATIVE 12/24/2016 1106   HGBUR negative 04/30/2010 1020   BILIRUBINUR NEGATIVE 12/24/2016 1106   KETONESUR NEGATIVE 12/24/2016 1106   PROTEINUR  NEGATIVE 12/24/2016 1106   UROBILINOGEN 0.2 03/15/2015 0508   NITRITE NEGATIVE 12/24/2016 1106   [place] 12/24/2016 1106    Radiology Studies: No results found.  Scheduled Meds: . enoxaparin (LOVENOX) injection  40 mg Subcutaneous Q24H  . folic acid  1 mg Oral Daily  . gabapentin  300 mg Oral TID  . gi cocktail  30 mL Oral BID  . hydrALAZINE  10 mg Oral Q8H  . lipase/protease/amylase  24,000 Units Oral TID WC  . loratadine  10 mg Oral Daily  . mouth rinse  15 mL Mouth Rinse q12n4p  . metoprolol tartrate  25 mg Oral BID  . multivitamin with minerals  1 tablet Oral Daily  . nicotine  21 mg Transdermal QHS  . pantoprazole  40 mg Oral QHS  . sertraline  75 mg Oral Daily  . sodium chloride flush  3 mL Intravenous Q12H  . thiamine  100 mg Oral Daily   Continuous Infusions: . sodium chloride 75 mL/hr at 12/25/16 1223     LOS: 9 days   Time spent: 25 minutes  Faye Ramsay, MD [facility] Pager [phone]  If 7PM-7AM, please contact night-coverage [email or web address] Password [email or web address] 12/26/2016, 9:26 AM

## 2016-12-27 LAB — CBC
HEMATOCRIT: 26.5 % — AB (ref 39.0–52.0)
Hemoglobin: 8.4 g/dL — ABNORMAL LOW (ref 13.0–17.0)
MCH: 27.2 pg (ref 26.0–34.0)
MCHC: 31.7 g/dL (ref 30.0–36.0)
MCV: 85.8 fL (ref 78.0–100.0)
PLATELETS: 255 10*3/uL (ref 150–400)
RBC: 3.09 MIL/uL — ABNORMAL LOW (ref 4.22–5.81)
RDW: 18.4 % — AB (ref 11.5–15.5)
WBC: 7.8 10*3/uL (ref 4.0–10.5)

## 2016-12-27 LAB — BASIC METABOLIC PANEL
Anion gap: 7 (ref 5–15)
BUN: <5 mg/dL — ABNORMAL LOW (ref 6–20)
CALCIUM: 8.3 mg/dL — AB (ref 8.9–10.3)
CO2: 24 mmol/L (ref 22–32)
CREATININE: 0.7 mg/dL (ref 0.61–1.24)
Chloride: 107 mmol/L (ref 101–111)
Glucose, Bld: 137 mg/dL — ABNORMAL HIGH (ref 65–99)
Potassium: 3.8 mmol/L (ref 3.5–5.1)
SODIUM: 138 mmol/L (ref 135–145)

## 2016-12-27 LAB — MAGNESIUM: MAGNESIUM: 1.7 mg/dL (ref 1.7–2.4)

## 2016-12-27 LAB — LIPASE, BLOOD: Lipase: 100 U/L — ABNORMAL HIGH (ref 11–51)

## 2016-12-27 MED ORDER — MAGNESIUM SULFATE 2 GM/50ML IV SOLN
2.0000 g | Freq: Once | INTRAVENOUS | Status: AC
  Administered 2016-12-27: 2 g via INTRAVENOUS
  Filled 2016-12-27: qty 50

## 2016-12-27 MED ORDER — BOOST / RESOURCE BREEZE PO LIQD
1.0000 | Freq: Three times a day (TID) | ORAL | Status: DC
  Administered 2016-12-27 – 2016-12-29 (×7): 1 via ORAL

## 2016-12-27 NOTE — Progress Notes (Signed)
Nutrition Follow-up  DOCUMENTATION CODES:   Severe malnutrition in context of acute illness/injury  INTERVENTION:   -Boost Breeze po TID, each supplement provides 250 kcal and 9 grams of protein -Continue MVI daily  NUTRITION DIAGNOSIS:   Malnutrition related to acute illness as evidenced by energy intake < or equal to 50% for > or equal to 5 days, moderate depletion of body fat, moderate depletions of muscle mass.  Ongoing   GOAL:   Patient will meet greater than or equal to 90% of their needs  Progressing  MONITOR:   PO intake, Supplement acceptance, Labs, Weight trends  REASON FOR ASSESSMENT:   Malnutrition Screening Tool    ASSESSMENT:   46 y.o. male with medical history significant of pancreatitis with frequent ED visits and abdominal pain, WPW s/p ablation, HTN, HLD, GERD, CLBP, asthma, etoh abuse, anxiety/depression who presents for abdominal pain. 10/10 at it's worst, mid abd and LUQ; radiates to his back and is associated with nausea and vomiting. Patient found with acute on chronic pancreatitis.  Pt sleeping soundly at time of visit. RD did not disturb.   Pt currently on a full liquid diet (owngraded on 12/25/16 on regular). Note pt has emesis on 12/26/16. Meal completion 30-100%. Noted Ensure supplements were d/c by MD on 12/21/16.   Labs reviewed.   Diet Order:  Diet full liquid Room service appropriate? Yes; Fluid consistency: Thin  Skin:  Reviewed, no issues  Last BM:  12/26/16  Height:   Ht Readings from Last 1 Encounters:  12/17/16 5\' 9"  (1.753 m)    Weight:   Wt Readings from Last 1 Encounters:  12/17/16 128 lb 9.6 oz (58.3 kg)    Ideal Body Weight:  72.7 kg  BMI:  Body mass index is 18.99 kg/m.  Estimated Nutritional Needs:   Kcal:  1800-2000  Protein:  85-100 grams  Fluid:  1.8-2.0 L  EDUCATION NEEDS:   No education needs identified at this time  [name] A. Jimmye Norman, RD, LDN, CDE Pager: [phone] After hours Pager:  [phone]

## 2016-12-27 NOTE — Progress Notes (Signed)
PROGRESS NOTE   [name]  VOJ:[id number] DOB: [birth year] DOA: 12/17/2016  PCP: Patient, No Pcp Per   Brief Narrative:  46 year old man PMH chronic pancreatitis, alcohol abuse, presented with abdominal pain. Admitted for acute on chronic pancreatitis.  Assessment/Plan Acute on chronic pancreatitis secondary to alcohol use. - patient reports feeling better this morning, so far tolerating full liquid diet - CT abd with pseudocysts, asked GI team to review - recommendation was to continue with conservative management - will keep on IVF, slowly advance diet - lipase in AM and if improving, suspect pt can be d/c in 24 hours  - no need for IV ABX, pt has gotten one day of ABX, will stop Zosyn today   Urinary urgency and burning - uA clear   Alcohol abuse, transaminitis - no evidence of withdrawal  Tobacco use disorder - consultation provided   Severe protein calorie malnutrition  - cont Ensure   Hypomagnesemia - supplemented but still on low end of normal - will give one more dose of Mg 2 gm IV - repeat in AM  Anemia of chronic disease - alcohol use - no evidence of bleeding - CBC in AM  Leukocytosis - resolved   Hypertension, accelerated - improving   DVT prophylaxis: Lovenox SQ Code Status: Full  Family Communication: Patient at bedside  Disposition Plan: possible d/c in AM if pt better   Consultants:   Gi over the phone   Procedures:   None  Antimicrobials:   Zosyn for 24 hours   Subjective: Pt reports feeling better.   Objective: Vitals:   12/26/16 1425 12/26/16 1633 12/26/16 2104 12/27/16 0453  BP: (!) 176/108 (!) 167/102 (!) 161/90 (!) 148/95  Pulse: 74  86 83  Resp: 18  18 18   Temp: (!) 97.3 F (36.3 C)  98 F (36.7 C) 97.9 F (36.6 C)  TempSrc: Oral  Oral Oral  SpO2: 100%  100% 96%  Weight:      Height:        Intake/Output Summary (Last 24 hours) at 12/27/16 1127 Last data filed at 12/27/16 9937  Gross per 24 hour    Intake          2214.17 ml  Output              500 ml  Net          1714.17 ml   Filed Weights   12/17/16 1315 12/17/16 2043  Weight: 56 kg (123 lb 9 oz) 58.3 kg (128 lb 9.6 oz)   Physical Exam  Constitutional: Appears calm, NAD CVS: RRR, S1/S2 +, no murmurs, no gallops, no carotid bruit.  Pulmonary: Effort and breath sounds normal, no stridor, rhonchi, wheezes, rales.  Abdominal: Soft. BS +,  no distension, tenderness, rebound or guarding.  Musculoskeletal: Normal range of motion. No edema and no tenderness.   Data Reviewed: I have personally reviewed following labs and imaging studies  CBC:  Recent Labs Lab 12/23/16 0454 12/24/16 0643 12/25/16 0549 12/26/16 0543 12/27/16 0335  WBC 9.2 9.1 9.5 7.6 7.8  HGB 8.6* 8.5* 8.8* 8.7* 8.4*  HCT 26.9* 26.4* 27.6* 27.2* 26.5*  MCV 85.9 85.4 84.9 85.5 85.8  PLT 223 244 246 243 169   Basic Metabolic Panel:  Recent Labs Lab 12/23/16 0454 12/24/16 0643 12/25/16 0549 12/26/16 0543 12/27/16 0335  NA 140 140 139 140 138  K 3.5 3.6 3.5 4.0 3.8  CL 107 107 105 107 107  CO2 27 26 26  26  24  GLUCOSE 88 76 84 93 137*  BUN <5* <5* <5* <5* <5*  CREATININE 0.59* 0.56* 0.58* 0.63 0.70  CALCIUM 8.4* 8.6* 8.7* 8.6* 8.3*  MG 1.5* 1.6* 1.6* 1.8 1.7   Liver Function Tests:  Recent Labs Lab 12/23/16 0454 12/24/16 0643 12/25/16 0549  AST 27 18 20   ALT 21 17 18   ALKPHOS 96 89 97  BILITOT 0.5 0.3 0.5  PROT 5.8* 6.1* 6.6  ALBUMIN 2.8* 2.9* 3.0*    Recent Labs Lab 12/23/16 0454 12/24/16 0643 12/25/16 0549 12/26/16 0543 12/27/16 0335  LIPASE 117* 150* 132* 134* 100*   Urine analysis:    Component Value Date/Time   COLORURINE COLORLESS (A) 12/24/2016 1106   APPEARANCEUR CLEAR 12/24/2016 1106   LABSPEC 1.002 (L) 12/24/2016 1106   PHURINE 7.0 12/24/2016 1106   GLUCOSEU NEGATIVE 12/24/2016 1106   HGBUR NEGATIVE 12/24/2016 1106   HGBUR negative 04/30/2010 1020   BILIRUBINUR NEGATIVE 12/24/2016 1106   KETONESUR NEGATIVE  12/24/2016 1106   PROTEINUR NEGATIVE 12/24/2016 1106   UROBILINOGEN 0.2 03/15/2015 0508   NITRITE NEGATIVE 12/24/2016 1106   LEUKOCYTESUR NEGATIVE 12/24/2016 1106    Radiology Studies: Ct Abdomen Pelvis Wo Contrast  Result Date: 12/26/2016 CLINICAL DATA:  Pancreatitis EXAM: CT ABDOMEN AND PELVIS WITHOUT CONTRAST TECHNIQUE: Multidetector CT imaging of the abdomen and pelvis was performed following the standard protocol without IV contrast. COMPARISON:  07/16/2016 and previous FINDINGS: Lower chest: No acute abnormality. Hepatobiliary: No focal liver abnormality is seen. No gallstones, gallbladder wall thickening, or biliary dilatation. Pancreas: 2.8 cm low-attenuation/cystic process in the mid pancreatic body, previously 2.1 cm. Smaller low-attenuation foci in the region of the pancreatic tail which appears enlarged compared to the prior study with somewhat indistinct margins suggesting some adjacent retroperitoneal inflammatory/ edematous change. Scattered parenchymal calcifications in the pancreatic neck and head as before. No new pseudocyst. Spleen: Normal in size without focal abnormality. Adrenals/Urinary Tract: Adrenal glands are unremarkable. Kidneys are normal, without renal calculi, focal lesion, or hydronephrosis. Bladder is unremarkable. Stomach/Bowel: Stomach is nondistended with possible wall thickening in the mid body. Small bowel decompressed. Appendix appears normal. Colon nondilated, with No evidence of bowel wall thickening, distention, or inflammatory changes. Vascular/Lymphatic: Mild scattered aortic and iliofemoral arterial calcifications without aneurysm. Reproductive: Prostate is unremarkable. Other: No ascites.  No free air. Musculoskeletal: Degenerative disc disease L5-S1. Negative for fracture or worrisome bone lesion. IMPRESSION: 1. Chronic pancreatitis with pseudocysts, with probable superimposed acute inflammatory component around the pancreatic tail. Electronically Signed   By:  Lucrezia Europe M.D.   On: 12/26/2016 14:21    Scheduled Meds: . enoxaparin (LOVENOX) injection  40 mg Subcutaneous Q24H  . folic acid  1 mg Oral Daily  . gabapentin  300 mg Oral TID  . gi cocktail  30 mL Oral BID  . hydrALAZINE  25 mg Oral Q8H  . lipase/protease/amylase  24,000 Units Oral TID WC  . loratadine  10 mg Oral Daily  . mouth rinse  15 mL Mouth Rinse q12n4p  . metoprolol tartrate  25 mg Oral BID  . multivitamin with minerals  1 tablet Oral Daily  . nicotine  21 mg Transdermal QHS  . pantoprazole  40 mg Oral QHS  . sertraline  75 mg Oral Daily  . sodium chloride flush  3 mL Intravenous Q12H  . thiamine  100 mg Oral Daily   Continuous Infusions: . sodium chloride 100 mL/hr at 12/27/16 0621     LOS: 10 days   Time spent:  25 minutes   Faye Ramsay, MD [facility] Pager [phone]  If 7PM-7AM, please contact night-coverage [email or web address] Password [facility] 12/27/2016, 11:27 AM

## 2016-12-28 DIAGNOSIS — F1020 Alcohol dependence, uncomplicated: Secondary | ICD-10-CM

## 2016-12-28 LAB — BASIC METABOLIC PANEL
Anion gap: 6 (ref 5–15)
CHLORIDE: 110 mmol/L (ref 101–111)
CO2: 24 mmol/L (ref 22–32)
CREATININE: 0.62 mg/dL (ref 0.61–1.24)
Calcium: 8.3 mg/dL — ABNORMAL LOW (ref 8.9–10.3)
GFR calc Af Amer: >60 mL/min (ref >60)
GFR calc non Af Amer: >60 mL/min (ref >60)
Glucose, Bld: 142 mg/dL — ABNORMAL HIGH (ref 65–99)
POTASSIUM: 3.6 mmol/L (ref 3.5–5.1)
Sodium: 140 mmol/L (ref 135–145)

## 2016-12-28 LAB — LIPASE, BLOOD: Lipase: 139 U/L — ABNORMAL HIGH (ref 11–51)

## 2016-12-28 LAB — CBC
HCT: 25.7 % — ABNORMAL LOW (ref 39.0–52.0)
Hemoglobin: 8.2 g/dL — ABNORMAL LOW (ref 13.0–17.0)
MCH: 28 pg (ref 26.0–34.0)
MCHC: 31.9 g/dL (ref 30.0–36.0)
MCV: 87.7 fL (ref 78.0–100.0)
Platelets: 243 10*3/uL (ref 150–400)
RBC: 2.93 MIL/uL — ABNORMAL LOW (ref 4.22–5.81)
RDW: 18.6 % — ABNORMAL HIGH (ref 11.5–15.5)
WBC: 8.9 10*3/uL (ref 4.0–10.5)

## 2016-12-28 LAB — MAGNESIUM: MAGNESIUM: 1.5 mg/dL — AB (ref 1.7–2.4)

## 2016-12-28 NOTE — Progress Notes (Signed)
PROGRESS NOTE   [name]  YNW:[id number] DOB: July 23, 1970 DOA: 12/17/2016  PCP: Patient, No Pcp Per   Brief Narrative:  46 year old man PMH chronic pancreatitis, alcohol abuse, presented with abdominal pain. Admitted for acute on chronic pancreatitis.  Assessment/Plan Acute on chronic pancreatitis secondary to alcohol use. - patient reports feeling better this morning, so far tolerating full liquid diet - CT abd with pseudocysts, essentially unchanged/stable -will continue conservative management  - will decrease IVF's rate and will advance diet consistency  - no fever and normal WBC's, per GI no treatment for pseudocyst -lipase continue slowly trending down  -will continue creon  -absolute abstinence encouraged   Urinary urgency and burning -resolved -no signs of infection appreciated on UA  Alcohol abuse, transaminitis -cessation counseling provided -looking to maintain abstinent -SW has provided info regarding outpatient resources.   Tobacco use disorder - cessation counseling given -continue nicotine patch   Severe protein calorie malnutrition  - cont Ensure  -diet advance to soft now -patient encourage to follow multiple small melas to facilitate digestion   Hypomagnesemia -still borderline low -will continue supplementation and repletion as needed   Anemia of chronic disease -most likely associated with alcohol use  -no signs or evidence of acute bleeding  -continue intermittent CBC to follow blood count trend   Leukocytosis -WBC's WNL -patient afebrile  -will monitor trend intermittently   Hypertension, accelerated - stable currently -will continue current antihypertensive regimen   DVT prophylaxis: Lovenox SQ Code Status: Full  Family Communication: Patient at bedside  Disposition Plan: possible discharge tomorrow. Will advance diet to soft, follow clinical response.   Consultants:   Gi curbside by Dr. Doyle Askew, not treatment for  pseudocyst at this point. Continue management for pancreatitis. Outpatient follow up.  Procedures:   See below for x-ray reports   Antimicrobials:   Zosyn for 24 hours   Subjective: Afebrile, feeling better overall. Still with mild nausea and some moderate pain in his abdomen. No CP and no SOB. Will like diet advance.    Objective: Vitals:   12/27/16 2158 12/28/16 0052 12/28/16 0455 12/28/16 1506  BP: (!) 172/105 139/89 (!) 157/98 (!) 157/89  Pulse: 85 89 73 79  Resp: 18  18 18   Temp: 98.8 F (37.1 C)  97.8 F (36.6 C) 97.8 F (36.6 C)  TempSrc: Oral  Oral Oral  SpO2: 100%  98% 100%  Weight:      Height:        Intake/Output Summary (Last 24 hours) at 12/28/16 1743 Last data filed at 12/28/16 1511  Gross per 24 hour  Intake             1163 ml  Output              550 ml  Net              613 ml   Filed Weights   12/17/16 1315 12/17/16 2043  Weight: 56 kg (123 lb 9 oz) 58.3 kg (128 lb 9.6 oz)   Physical Exam  Constitutional: afebrile, no CP, no SOB. Reports very little nausea  And moderate abd pain. Will like to have diet advance further. CVS: RRR, no rubs, no gallops, no murmur; no JVD.  Pulmonary: normal resp effort, no wheezing, no crackles.  Abdominal: soft, tender to palpation in mid abd and LUQ; no guarding, positive BS. Musculoskeletal: no edema, no cyanosis.  Data Reviewed: I have personally reviewed following labs and imaging studies  CBC:  Recent  Labs Lab 12/24/16 0643 12/25/16 0549 12/26/16 0543 12/27/16 0335 12/28/16 0436  WBC 9.1 9.5 7.6 7.8 8.9  HGB 8.5* 8.8* 8.7* 8.4* 8.2*  HCT 26.4* 27.6* 27.2* 26.5* 25.7*  MCV 85.4 84.9 85.5 85.8 87.7  PLT 244 246 243 255 300   Basic Metabolic Panel:  Recent Labs Lab 12/24/16 0643 12/25/16 0549 12/26/16 0543 12/27/16 0335 12/28/16 0436  NA 140 139 140 138 140  K 3.6 3.5 4.0 3.8 3.6  CL 107 105 107 107 110  CO2 26 26 26 24 24   GLUCOSE 76 84 93 137* 142*  BUN <5* <5* <5* <5* <5*  CREATININE  0.56* 0.58* 0.63 0.70 0.62  CALCIUM 8.6* 8.7* 8.6* 8.3* 8.3*  MG 1.6* 1.6* 1.8 1.7 1.5*   Liver Function Tests:  Recent Labs Lab 12/23/16 0454 12/24/16 0643 12/25/16 0549  AST 27 18 20   ALT 21 17 18   ALKPHOS 96 89 97  BILITOT 0.5 0.3 0.5  PROT 5.8* 6.1* 6.6  ALBUMIN 2.8* 2.9* 3.0*    Recent Labs Lab 12/24/16 0643 12/25/16 0549 12/26/16 0543 12/27/16 0335 12/28/16 0436  LIPASE 150* 132* 134* 100* 139*   Urine analysis:    Component Value Date/Time   COLORURINE COLORLESS (A) 12/24/2016 1106   APPEARANCEUR CLEAR 12/24/2016 1106   LABSPEC 1.002 (L) 12/24/2016 1106   PHURINE 7.0 12/24/2016 1106   GLUCOSEU NEGATIVE 12/24/2016 1106   HGBUR NEGATIVE 12/24/2016 1106   HGBUR negative 04/30/2010 1020   BILIRUBINUR NEGATIVE 12/24/2016 1106   KETONESUR NEGATIVE 12/24/2016 1106   PROTEINUR NEGATIVE 12/24/2016 1106   UROBILINOGEN 0.2 03/15/2015 0508   NITRITE NEGATIVE 12/24/2016 1106   LEUKOCYTESUR NEGATIVE 12/24/2016 1106    Radiology Studies: No results found.  Scheduled Meds: . enoxaparin (LOVENOX) injection  40 mg Subcutaneous Q24H  . feeding supplement  1 Container Oral TID BM  . folic acid  1 mg Oral Daily  . gabapentin  300 mg Oral TID  . gi cocktail  30 mL Oral BID  . hydrALAZINE  25 mg Oral Q8H  . lipase/protease/amylase  24,000 Units Oral TID WC  . loratadine  10 mg Oral Daily  . mouth rinse  15 mL Mouth Rinse q12n4p  . metoprolol tartrate  25 mg Oral BID  . multivitamin with minerals  1 tablet Oral Daily  . nicotine  21 mg Transdermal QHS  . pantoprazole  40 mg Oral QHS  . sertraline  75 mg Oral Daily  . sodium chloride flush  3 mL Intravenous Q12H  . thiamine  100 mg Oral Daily   Continuous Infusions: . sodium chloride 100 mL/hr at 12/28/16 1511     LOS: 11 days   Time spent: 25 minutes   Barton Dubois, MD [facility] Pager [phone]  If 7PM-7AM, please contact night-coverage [email or web address] Password [facility] 12/28/2016, 5:43 PM

## 2016-12-29 DIAGNOSIS — K219 Gastro-esophageal reflux disease without esophagitis: Secondary | ICD-10-CM

## 2016-12-29 MED ORDER — HYDRALAZINE HCL 25 MG PO TABS: 25.0000 mg | ORAL_TABLET | Freq: Three times a day (TID) | ORAL | 1 refills | Status: DC

## 2016-12-29 MED ORDER — METOPROLOL TARTRATE 25 MG PO TABS: 25.0000 mg | ORAL_TABLET | Freq: Two times a day (BID) | ORAL | 0 refills | Status: DC

## 2016-12-29 MED ORDER — GI COCKTAIL ~~LOC~~: 30.0000 mL | Freq: Two times a day (BID) | ORAL | 0 refills | Status: DC | PRN

## 2016-12-29 MED ORDER — ONDANSETRON 4 MG PO TBDP: 4.0000 mg | ORAL_TABLET | Freq: Three times a day (TID) | ORAL | 0 refills | Status: DC | PRN

## 2016-12-29 MED ORDER — IBUPROFEN 400 MG PO TABS: 400.0000 mg | ORAL_TABLET | Freq: Three times a day (TID) | ORAL | 0 refills | Status: DC | PRN

## 2016-12-29 MED ORDER — THIAMINE HCL 100 MG PO TABS: 100.0000 mg | ORAL_TABLET | Freq: Every day | ORAL | 1 refills | Status: DC

## 2016-12-29 MED ORDER — PANTOPRAZOLE SODIUM 40 MG PO TBEC: 40.0000 mg | DELAYED_RELEASE_TABLET | Freq: Every day | ORAL | 1 refills | Status: DC

## 2016-12-29 MED ORDER — OXYCODONE HCL 5 MG PO TABS: 5.0000 mg | ORAL_TABLET | Freq: Three times a day (TID) | ORAL | 0 refills | Status: DC | PRN

## 2016-12-29 MED ORDER — PANCRELIPASE (LIP-PROT-AMYL) 12000-38000 UNITS PO CPEP: 24000.0000 [IU] | ORAL_CAPSULE | Freq: Three times a day (TID) | ORAL | 2 refills | Status: DC

## 2016-12-29 MED ORDER — NICOTINE 21 MG/24HR TD PT24: 21.0000 mg | MEDICATED_PATCH | Freq: Every day | TRANSDERMAL | 0 refills | Status: DC

## 2016-12-29 NOTE — Progress Notes (Signed)
Discharge home. Home discharge instruction given, no question verbalized. 

## 2016-12-29 NOTE — Discharge Summary (Signed)
Physician Discharge Summary  Westin Knotts EVO:[id number] DOB: Jan 14, 1971 DOA: 12/17/2016  PCP: Patient, No Pcp Per  Admit date: 12/17/2016 Discharge date: 12/29/2016  Time spent: 35 minutes  Recommendations for Outpatient Follow-up:  Repeat CMET to follow on electrolytes, LFT's and renal function Reassess BP and adjust medications as needed  Repeat Lipase level Continue providing assistance for alcohol and tobacco abuse.  Discharge Diagnoses:  Principal Problem:   Acute on chronic pancreatitis (HCC) Active Problems:   TOBACCO ABUSE   Severe protein-calorie malnutrition (HCC)   MDD (major depressive disorder), recurrent severe, without psychosis ([place])   Alcohol dependence ([place])   Alcohol use disorder, severe, dependence ([place])   Gastroesophageal reflux disease   Discharge Condition: stable and improved. Patient discharge home with arrangement to establish care with a PCP in the next 2 weeks.  Diet recommendation: low fat/heart healthy diet  Filed Weights   12/17/16 1315 12/17/16 2043  Weight: 56 kg (123 lb 9 oz) 58.3 kg (128 lb 9.6 oz)    History of present illness:  46 year old man PMH chronic pancreatitis, alcohol abuse, presented with abdominal pain. Admitted for acute on chronic pancreatitis.   Hospital Course:  Acute on chronic pancreatitis secondary to alcohol use. - patient reports feeling better this morning, no nausea, no vomiting - CT abd with pseudocysts, essentially unchanged/stable; repaet at some point in outpatient setting, to assure stability of pseudocysts. -will continue conservative management  -patient able to tolerate soft diet prior to discharge -no fever and normal WBC's, per GI no treatment for pseudocyst -lipase continue slowly trending down; repeat levels at follow up visit. -will continue creon at discharge  -absolute abstinence encouraged   Urinary urgency and burning -resolved -no signs of infection appreciated on UA  Alcohol  abuse, transaminitis -cessation counseling provided -looking to maintain abstinent -SW has provided info regarding outpatient resources.   Tobacco use disorder -cessation counseling given -continue nicotine patch   Severe protein calorie malnutrition  -cont Ensure  -diet advance to soft now -patient encourage to follow multiple small melas to facilitate digestion   Hypomagnesemia -repleted and WNL at discharge   Anemia of chronic disease -most likely associated with alcohol use  -no signs or evidence of acute bleeding  -continue intermittent CBC to follow blood count trend   Leukocytosis -WBC's WNL at discharge -patient afebrile  -will recommend monitor trend intermittently   Hypertension, accelerated - stable currently -will continue current antihypertensive regimen -follow electrolytes as an outpatient and check renal function at follow up.    Procedures:  See below for x-ray reports   Consultations:  Gi curbside by Dr. Doyle Askew, not treatment for pseudocyst at this point. Continue management for pancreatitis. Outpatient follow up.  Discharge Exam: Vitals:   12/28/16 2112 12/29/16 0526  BP: (!) 165/99 (!) 159/99  Pulse: 91 81  Resp: 18 18  Temp: 97.6 F (36.4 C) 98.5 F (36.9 C)  SpO2: 95% 99%   Constitutional: afebrile, no CP, no SOB. Reports tolerating diet and minimal nausea without vomiting. Still with some abd pain, but much improved.   CVS: RRR, no rubs, no gallops, no murmur; no JVD.  Pulmonary: normal resp effort, no wheezing, no crackles.  Abdominal: soft, tender to palpation in mid abd and LUQ; no guarding, positive BS. Musculoskeletal: no edema, no cyanosis.   Discharge Instructions   Discharge Instructions    Diet - low sodium heart healthy    Complete by:  As directed    Discharge instructions    Complete  by:  As directed    Follow low fat diet  Keep yourself well hydrated Stop alcohol consumption  Follow up as instructed to  set up/establish care with PCP     Current Discharge Medication List    START taking these medications   Details  hydrALAZINE (APRESOLINE) 25 MG tablet Take 1 tablet (25 mg total) by mouth every 8 (eight) hours. Qty: 90 tablet, Refills: 1    nicotine (NICODERM CQ - DOSED IN MG/24 HOURS) 21 mg/24hr patch Place 1 patch (21 mg total) onto the skin at bedtime. Qty: 28 patch, Refills: 0    oxyCODONE (ROXICODONE) 5 MG immediate release tablet Take 1 tablet (5 mg total) by mouth every 8 (eight) hours as needed for severe pain. Qty: 20 tablet, Refills: 0    thiamine 100 MG tablet Take 1 tablet (100 mg total) by mouth daily. Qty: 30 tablet, Refills: 1      CONTINUE these medications which have CHANGED   Details  Alum & Mag Hydroxide-Simeth (GI COCKTAIL) SUSP suspension Take 30 mLs by mouth 2 (two) times daily as needed for indigestion. Shake well. Qty: 240 mL, Refills: 0    ibuprofen (ADVIL,MOTRIN) 400 MG tablet Take 1 tablet (400 mg total) by mouth every 8 (eight) hours as needed for moderate pain. Qty: 40 tablet, Refills: 0    lipase/protease/amylase (CREON) 12000 units CPEP capsule Take 2 capsules (24,000 Units total) by mouth 3 (three) times daily with meals. For pancreatitis Qty: 90 capsule, Refills: 2   Associated Diagnoses: Alcohol-induced acute pancreatitis    metoprolol tartrate (LOPRESSOR) 25 MG tablet Take 1 tablet (25 mg total) by mouth 2 (two) times daily. For high blood pressure Qty: 30 tablet, Refills: 0    ondansetron (ZOFRAN ODT) 4 MG disintegrating tablet Take 1 tablet (4 mg total) by mouth every 8 (eight) hours as needed for nausea or vomiting. Qty: 30 tablet, Refills: 0    pantoprazole (PROTONIX) 40 MG tablet Take 1 tablet (40 mg total) by mouth daily. Qty: 30 tablet, Refills: 1      CONTINUE these medications which have NOT CHANGED   Details  albuterol (PROVENTIL HFA;VENTOLIN HFA) 108 (90 Base) MCG/ACT inhaler Inhale 2 puffs into the lungs every 6 (six) hours  as needed for wheezing or shortness of breath.    cholestyramine (QUESTRAN) 4 g packet Take 1 packet (4 g total) by mouth 3 (three) times daily as needed (diarrhea). Qty: 45 each, Refills: 0    famotidine (PEPCID) 20 MG tablet Take 1 tablet (20 mg total) by mouth 2 (two) times daily. Qty: 30 tablet, Refills: 3    fluticasone (FLONASE) 50 MCG/ACT nasal spray Place 1 spray into both nostrils daily as needed for rhinitis. Refills: 2    gabapentin (NEURONTIN) 300 MG capsule Take 1 capsule (300 mg total) by mouth 3 (three) times daily. For agitation Qty: 90 capsule, Refills: 0    haloperidol (HALDOL) 10 MG tablet Take 10 mg by mouth at bedtime. Refills: 0    hydrocerin (EUCERIN) CREA Apply 1 application topically 2 (two) times daily. For dry skin Refills: 0    loratadine (CLARITIN) 10 MG tablet Take 1 tablet (10 mg total) by mouth daily. (May purchase from over the counter): For allergies Qty: 30 tablet, Refills: 0    naltrexone (DEPADE) 50 MG tablet Take 1 tablet (50 mg total) by mouth daily. For alcoholism Qty: 30 tablet, Refills: 0    sertraline (ZOLOFT) 25 MG tablet Take 3 tablets (75 mg total)  by mouth daily. For depression Qty: 90 tablet, Refills: 0      STOP taking these medications     diphenhydrAMINE (BENADRYL) 25 mg capsule      hydrOXYzine (ATARAX/VISTARIL) 25 MG tablet      nicotine polacrilex (NICORETTE) 2 MG gum      promethazine (PHENERGAN) 25 MG tablet      sucralfate (CARAFATE) 1 g tablet      benzocaine (ORAJEL) 10 % mucosal gel      HYDROcodone-acetaminophen (NORCO/VICODIN) 5-325 MG tablet      ondansetron (ZOFRAN) 4 MG tablet      sucralfate (CARAFATE) 1 g tablet        Allergies  Allergen Reactions  . Shellfish-Derived Products Nausea And Vomiting  . Trazodone And Nefazodone Other (See Comments)    Reaction:  Muscle spasms  . Robaxin [Methocarbamol] Other (See Comments)    "jumpy limbs"  . Contrast Media [Iodinated Diagnostic Agents] Hives   . Reglan [Metoclopramide] Other (See Comments)    Reaction:  Muscle spasms   Follow-up Information    [facility] up.   Why:  January 11, 2017 at 11 am , remember to take Creon assistance application.  Contact information: [facility] [id number] [phone]          The results of significant diagnostics from this hospitalization (including imaging, microbiology, ancillary and laboratory) are listed below for reference.    Significant Diagnostic Studies: Ct Abdomen Pelvis Wo Contrast  Result Date: 12/26/2016 CLINICAL DATA:  Pancreatitis EXAM: CT ABDOMEN AND PELVIS WITHOUT CONTRAST TECHNIQUE: Multidetector CT imaging of the abdomen and pelvis was performed following the standard protocol without IV contrast. COMPARISON:  07/16/2016 and previous FINDINGS: Lower chest: No acute abnormality. Hepatobiliary: No focal liver abnormality is seen. No gallstones, gallbladder wall thickening, or biliary dilatation. Pancreas: 2.8 cm low-attenuation/cystic process in the mid pancreatic body, previously 2.1 cm. Smaller low-attenuation foci in the region of the pancreatic tail which appears enlarged compared to the prior study with somewhat indistinct margins suggesting some adjacent retroperitoneal inflammatory/ edematous change. Scattered parenchymal calcifications in the pancreatic neck and head as before. No new pseudocyst. Spleen: Normal in size without focal abnormality. Adrenals/Urinary Tract: Adrenal glands are unremarkable. Kidneys are normal, without renal calculi, focal lesion, or hydronephrosis. Bladder is unremarkable. Stomach/Bowel: Stomach is nondistended with possible wall thickening in the mid body. Small bowel decompressed. Appendix appears normal. Colon nondilated, with No evidence of bowel wall thickening, distention, or inflammatory changes. Vascular/Lymphatic: Mild scattered aortic and iliofemoral arterial  calcifications without aneurysm. Reproductive: Prostate is unremarkable. Other: No ascites.  No free air. Musculoskeletal: Degenerative disc disease L5-S1. Negative for fracture or worrisome bone lesion. IMPRESSION: 1. Chronic pancreatitis with pseudocysts, with probable superimposed acute inflammatory component around the pancreatic tail. Electronically Signed   By: Lucrezia Europe M.D.   On: 12/26/2016 14:21   Ct Abdomen Pelvis W Contrast  Result Date: 12/04/2016 CLINICAL DATA:  History of pancreatitis. Abdominal pain, nausea, vomiting EXAM: CT ABDOMEN AND PELVIS WITH CONTRAST TECHNIQUE: Multidetector CT imaging of the abdomen and pelvis was performed using the standard protocol following bolus administration of intravenous contrast. CONTRAST:  180mL ISOVUE-300 IOPAMIDOL (ISOVUE-300) INJECTION 61% COMPARISON:  None. FINDINGS: Lower chest: Lung bases are clear. No effusions. Heart is normal size. Hepatobiliary: No focal hepatic abnormality. Gallbladder unremarkable. Pancreas: Calcifications throughout the pancreas compatible with chronic pancreatitis. Cystic areas also throughout the pancreas. 12 mm cystic area in the pancreatic head.  2.7 cm cystic area in the upper body. 10 mm cystic area in the tail of. Mild ductal dilatation. No surrounding stranding/inflammation. Spleen: No focal abnormality.  Normal size. Adrenals/Urinary Tract: No adrenal abnormality. No focal renal abnormality. No stones or hydronephrosis. Urinary bladder is unremarkable. Stomach/Bowel: Moderate stool in the colon. Stomach, large and small bowel grossly unremarkable. Vascular/Lymphatic: Aortic and iliac calcifications. No aneurysm or adenopathy. Reproductive: No visible focal abnormality. Other: No free fluid or free air. Musculoskeletal: No acute bony abnormality. IMPRESSION: Changes of chronic calcific pancreatitis. There is associated ductal dilatation and 3 cystic areas, the largest in the body measuring 2.7 cm, likely pseudocysts. No  surrounding inflammation to suggest superimposed acute pancreatitis. Aortoiliac atherosclerosis. Electronically Signed   By: Rolm Baptise M.D.   On: 12/04/2016 07:38    Labs: Basic Metabolic Panel:  Recent Labs Lab 12/24/16 0643 12/25/16 0549 12/26/16 0543 12/27/16 0335 12/28/16 0436  NA 140 139 140 138 140  K 3.6 3.5 4.0 3.8 3.6  CL 107 105 107 107 110  CO2 26 26 26 24 24   GLUCOSE 76 84 93 137* 142*  BUN <5* <5* <5* <5* <5*  CREATININE 0.56* 0.58* 0.63 0.70 0.62  CALCIUM 8.6* 8.7* 8.6* 8.3* 8.3*  MG 1.6* 1.6* 1.8 1.7 1.5*   Liver Function Tests:  Recent Labs Lab 12/23/16 0454 12/24/16 0643 12/25/16 0549  AST 27 18 20   ALT 21 17 18   ALKPHOS 96 89 97  BILITOT 0.5 0.3 0.5  PROT 5.8* 6.1* 6.6  ALBUMIN 2.8* 2.9* 3.0*    Recent Labs Lab 12/24/16 0643 12/25/16 0549 12/26/16 0543 12/27/16 0335 12/28/16 0436  LIPASE 150* 132* 134* 100* 139*   CBC:  Recent Labs Lab 12/24/16 0643 12/25/16 0549 12/26/16 0543 12/27/16 0335 12/28/16 0436  WBC 9.1 9.5 7.6 7.8 8.9  HGB 8.5* 8.8* 8.7* 8.4* 8.2*  HCT 26.4* 27.6* 27.2* 26.5* 25.7*  MCV 85.4 84.9 85.5 85.8 87.7  PLT 244 246 243 255 243    BNP (last 3 results)  Recent Labs  07/03/16 1314  BNP 52.9    Signed:  Barton Dubois MD.  [facility] 12/29/2016, 1:58 PM

## 2017-01-09 IMAGING — US US ABDOMEN COMPLETE
1 series · 13 of 25 positions shown · non-contrast
Comparison: Abdominal MRI December 17, 2014

CLINICAL DATA: History of pancreatitis. One week history of upper
abdominal pain with nausea and vomiting

EXAM:
ULTRASOUND ABDOMEN COMPLETE

[Series 1: us abdomen complete · 0.22mm/px · 13 of 88 slices shown]
[im 1/88]
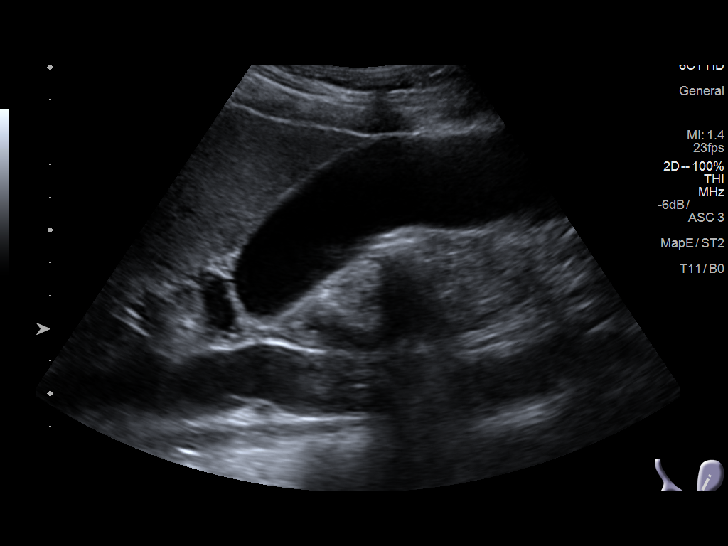
[im 8/88]
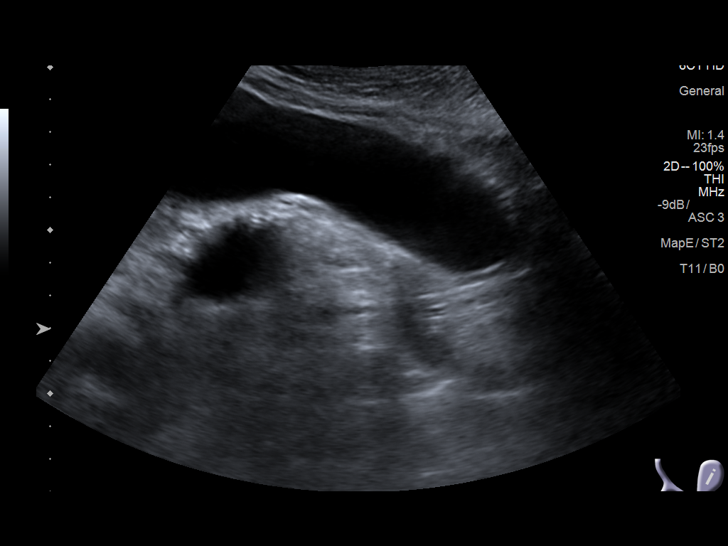
[im 15/88]
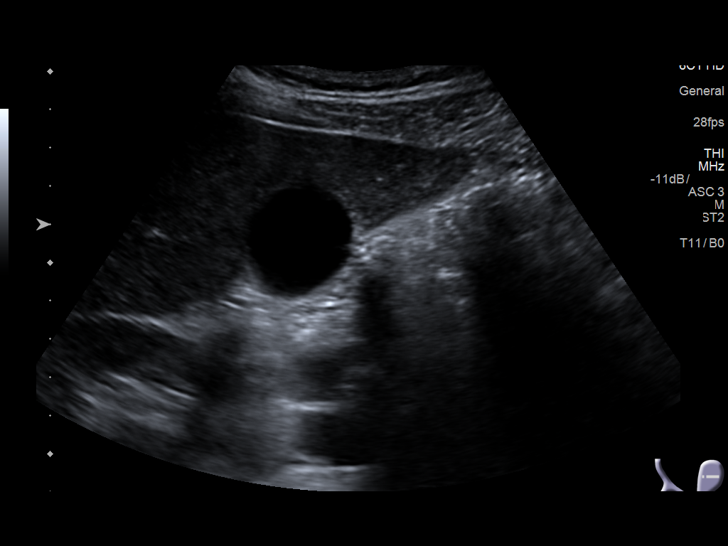
[im 22/88]
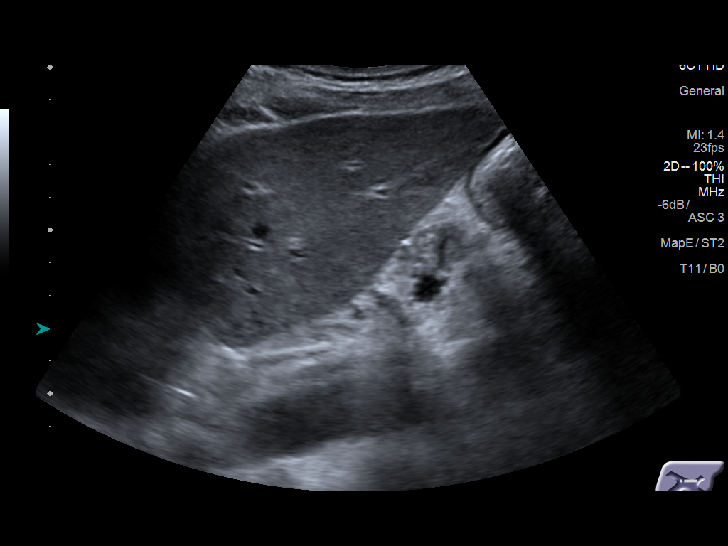
[im 30/88]
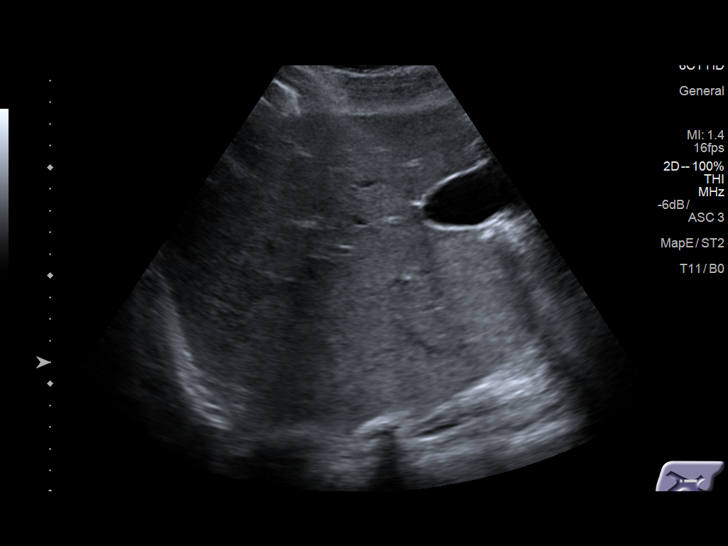
[im 37/88]
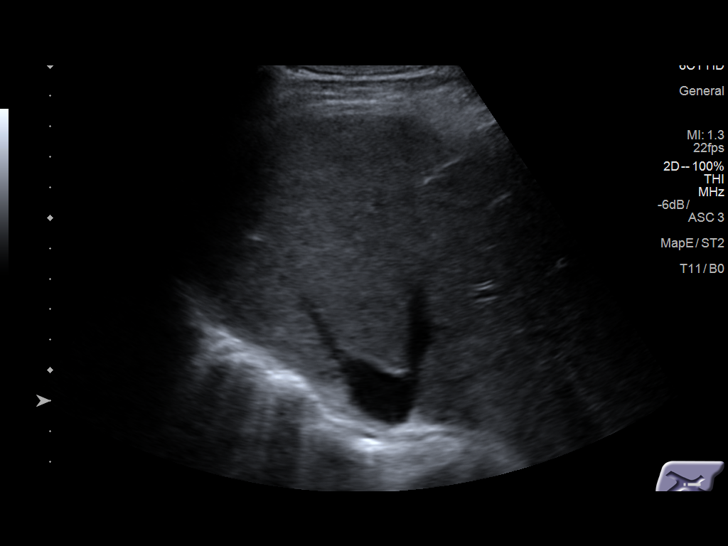
[im 44/88]
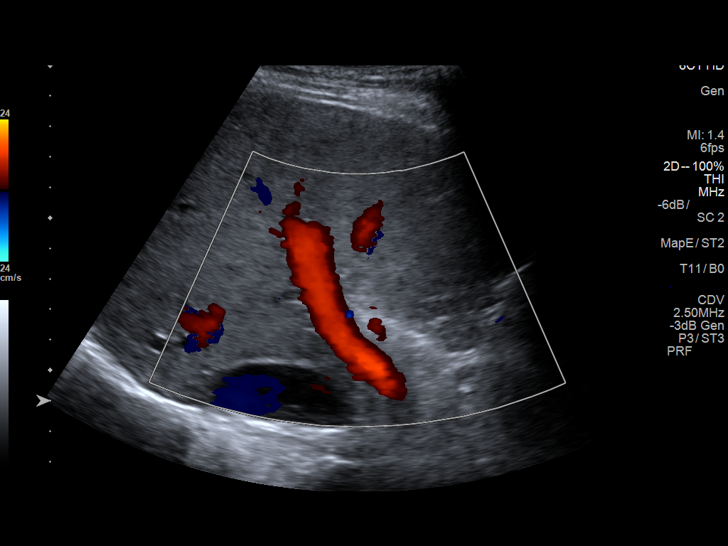
[im 51/88]
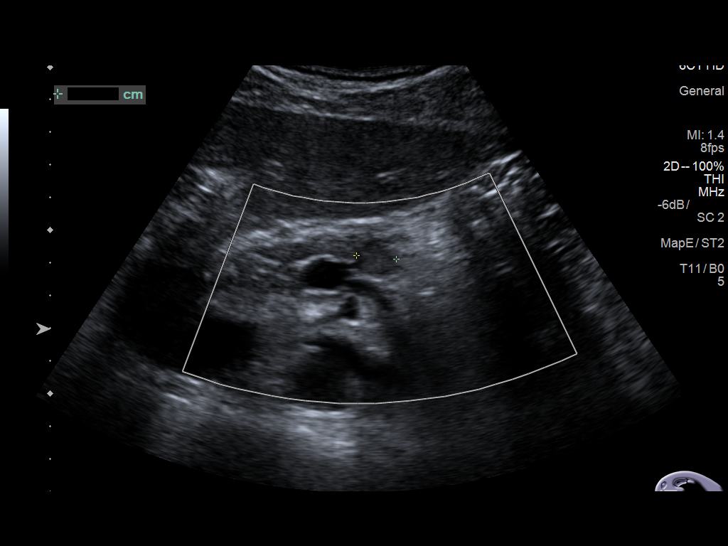
[im 59/88]
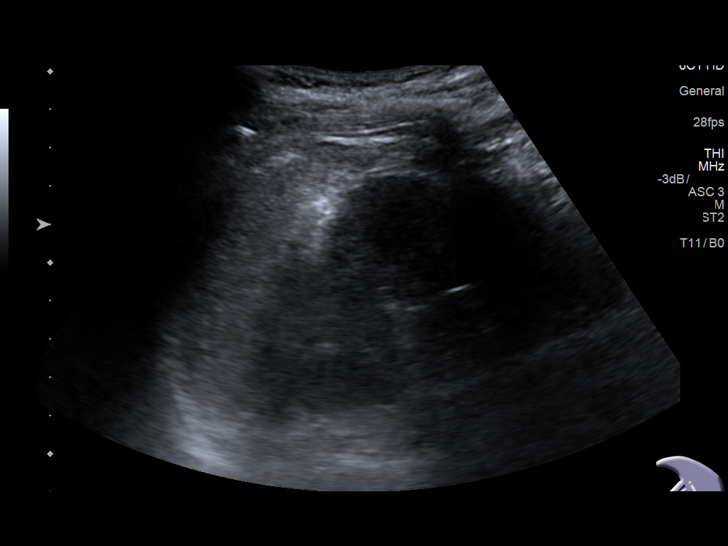
[im 66/88]
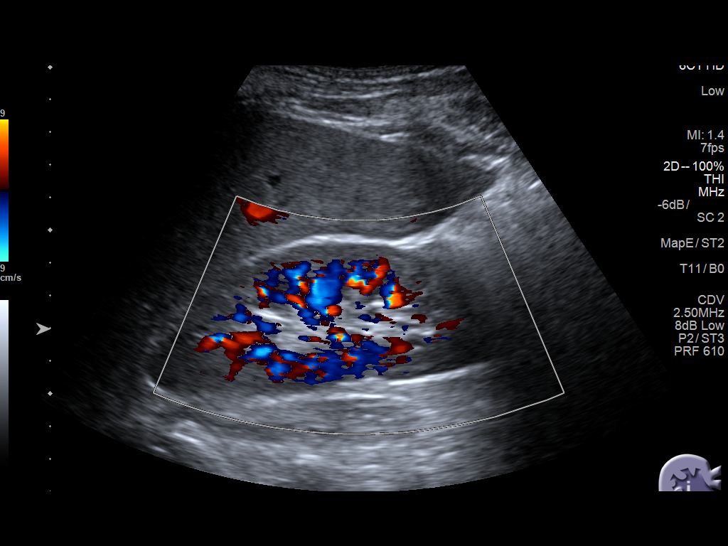
[im 73/88]
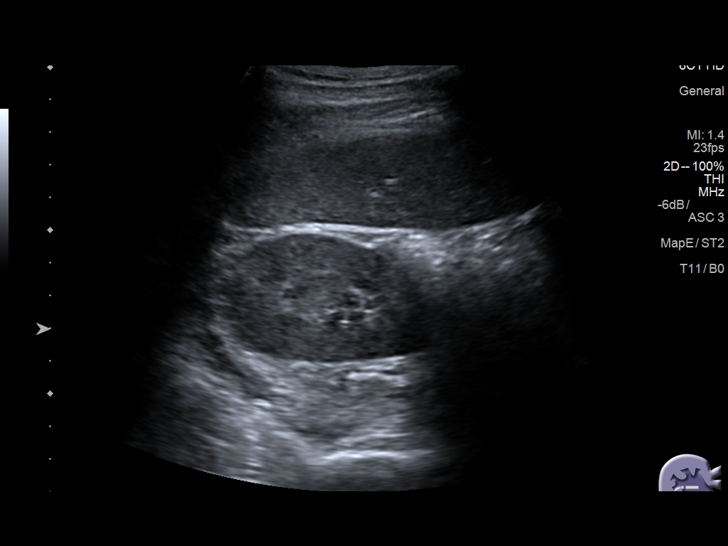
[im 80/88]
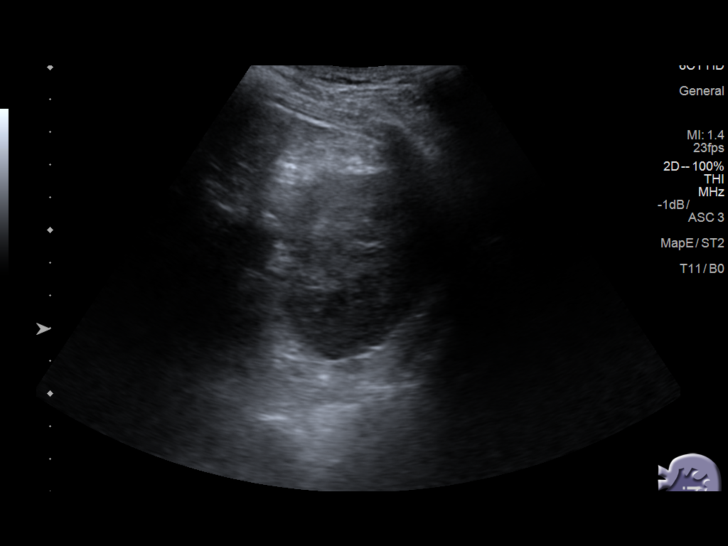
[im 88/88]
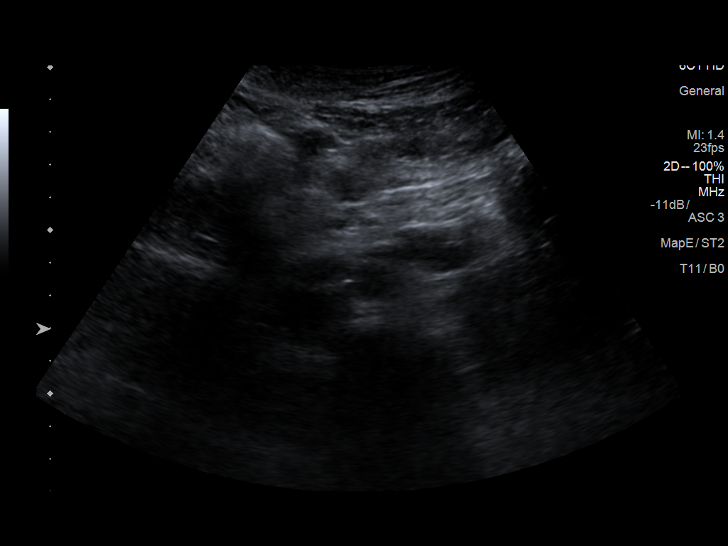

[13 of 25 positions shown; findings below may reference images not displayed]

FINDINGS: Gallbladder: No gallstones or wall thickening visualized. There is
no pericholecystic fluid. No sonographic Murphy sign noted.

Common bile duct: Diameter: 4 mm. There is no intrahepatic, common
hepatic, or common bile duct dilatation.

Liver: No focal lesion identified. Within normal limits in
parenchymal echogenicity.

IVC: No abnormality visualized.

Pancreas: There is inflammatory change in the region of the body of
the pancreas. There is a focal area of decreased attenuation in the
body of the pancreas measuring 1.1 x 0.9 x 1.2 cm. No other focal
lesion identified. There is no pancreatic duct dilatation.

Spleen: Size and appearance within normal limits.

Right Kidney: Length: 11.6 cm. Echogenicity within normal limits. No
mass or hydronephrosis visualized.

Left Kidney: Length: 11.9 cm. Echogenicity within normal limits. No
mass or hydronephrosis visualized.

Abdominal aorta: No aneurysm visualized.

Other findings: There is trace ascites.
IMPRESSION: Residual inflammatory change in the region of the body the pancreas
with a hypoechoic nodular lesion measuring 1.1 x 0.9 x 1.2 cm. Based
on the recent MR, suspect inflammatory lesion/small pseudocyst.
Given echogenicity within this structure, a small infected
pseudocyst cannot be excluded. No other focal pancreatic lesion
appreciable by ultrasound.

Trace ascites adjacent to liver.

Study otherwise unremarkable.

## 2017-01-11 ENCOUNTER — Inpatient Hospital Stay (INDEPENDENT_AMBULATORY_CARE_PROVIDER_SITE_OTHER): Payer: Self-pay | Admitting: Physician Assistant

## 2017-01-11 ENCOUNTER — Emergency Department (HOSPITAL_COMMUNITY)
Admission: EM | Admit: 2017-01-11 | Discharge: 2017-01-11 | Disposition: A | Discharge status: Home / Self Care | Payer: Self-pay | Attending: Emergency Medicine | Admitting: Emergency Medicine

## 2017-01-11 ENCOUNTER — Encounter (HOSPITAL_COMMUNITY): Payer: Self-pay

## 2017-01-11 DIAGNOSIS — J45909 Unspecified asthma, uncomplicated: Secondary | ICD-10-CM | POA: Insufficient documentation

## 2017-01-11 DIAGNOSIS — I10 Essential (primary) hypertension: Secondary | ICD-10-CM | POA: Insufficient documentation

## 2017-01-11 DIAGNOSIS — R1013 Epigastric pain: Secondary | ICD-10-CM | POA: Insufficient documentation

## 2017-01-11 DIAGNOSIS — F17210 Nicotine dependence, cigarettes, uncomplicated: Secondary | ICD-10-CM | POA: Insufficient documentation

## 2017-01-11 DIAGNOSIS — Z79899 Other long term (current) drug therapy: Secondary | ICD-10-CM | POA: Insufficient documentation

## 2017-01-11 DIAGNOSIS — D573 Sickle-cell trait: Secondary | ICD-10-CM | POA: Insufficient documentation

## 2017-01-11 DIAGNOSIS — I456 Pre-excitation syndrome: Secondary | ICD-10-CM | POA: Insufficient documentation

## 2017-01-11 DIAGNOSIS — K861 Other chronic pancreatitis: Secondary | ICD-10-CM | POA: Insufficient documentation

## 2017-01-11 LAB — COMPREHENSIVE METABOLIC PANEL
ALT: 33 U/L (ref 17–63)
ANION GAP: 10 (ref 5–15)
AST: 51 U/L — ABNORMAL HIGH (ref 15–41)
Albumin: 4 g/dL (ref 3.5–5.0)
Alkaline Phosphatase: 97 U/L (ref 38–126)
BUN: <5 mg/dL — ABNORMAL LOW (ref 6–20)
CO2: 20 mmol/L — AB (ref 22–32)
CREATININE: 0.71 mg/dL (ref 0.61–1.24)
Calcium: 8.8 mg/dL — ABNORMAL LOW (ref 8.9–10.3)
Chloride: 105 mmol/L (ref 101–111)
Glucose, Bld: 98 mg/dL (ref 65–99)
POTASSIUM: 4.6 mmol/L (ref 3.5–5.1)
SODIUM: 135 mmol/L (ref 135–145)
Total Bilirubin: 0.7 mg/dL (ref 0.3–1.2)
Total Protein: 7.9 g/dL (ref 6.5–8.1)

## 2017-01-11 LAB — CBC
HCT: 34.9 % — ABNORMAL LOW (ref 39.0–52.0)
Hemoglobin: 11.5 g/dL — ABNORMAL LOW (ref 13.0–17.0)
MCH: 26.9 pg (ref 26.0–34.0)
MCHC: 33 g/dL (ref 30.0–36.0)
MCV: 81.7 fL (ref 78.0–100.0)
PLATELETS: 355 10*3/uL (ref 150–400)
RBC: 4.27 MIL/uL (ref 4.22–5.81)
RDW: 17.4 % — AB (ref 11.5–15.5)
WBC: 8.2 10*3/uL (ref 4.0–10.5)

## 2017-01-11 LAB — LIPASE, BLOOD: LIPASE: 143 U/L — AB (ref 11–51)

## 2017-01-11 LAB — ETHANOL

## 2017-01-11 MED ORDER — ALUM & MAG HYDROXIDE-SIMETH 200-200-20 MG/5ML PO SUSP
30.0000 mL | Freq: Once | ORAL | Status: AC
Start: 2017-01-11 — End: 2017-01-11
  Administered 2017-01-11: 30 mL via ORAL
  Filled 2017-01-11: qty 30

## 2017-01-11 MED ORDER — HYDROMORPHONE HCL 1 MG/ML IJ SOLN
1.0000 mg | Freq: Once | INTRAMUSCULAR | Status: AC
  Administered 2017-01-11: 1 mg via INTRAVENOUS
  Filled 2017-01-11: qty 1

## 2017-01-11 MED ORDER — ONDANSETRON HCL 4 MG/2ML IJ SOLN
4.0000 mg | Freq: Once | INTRAMUSCULAR | Status: AC
  Administered 2017-01-11: 4 mg via INTRAVENOUS
  Filled 2017-01-11: qty 2

## 2017-01-11 MED ORDER — ACETAMINOPHEN 500 MG PO TABS
1000.0000 mg | ORAL_TABLET | Freq: Once | ORAL | Status: AC
  Administered 2017-01-11: 1000 mg via ORAL
  Filled 2017-01-11: qty 2

## 2017-01-11 MED ORDER — TRAMADOL HCL 50 MG PO TABS
50.0000 mg | ORAL_TABLET | Freq: Once | ORAL | Status: AC
  Administered 2017-01-11: 50 mg via ORAL
  Filled 2017-01-11: qty 1

## 2017-01-11 MED ORDER — FAMOTIDINE 20 MG PO TABS
20.0000 mg | ORAL_TABLET | Freq: Once | ORAL | Status: AC
  Administered 2017-01-11: 20 mg via ORAL
  Filled 2017-01-11: qty 1

## 2017-01-11 MED ORDER — SODIUM CHLORIDE 0.9 % IV BOLUS (SEPSIS)
1000.0000 mL | Freq: Once | INTRAVENOUS | Status: AC
  Administered 2017-01-11: 1000 mL via INTRAVENOUS

## 2017-01-11 NOTE — ED Triage Notes (Signed)
[facility]- pt here for abdominal pain, LUQ pain stabbing in nature. Pt reports ETOH use last night.
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
## 2017-01-11 NOTE — Discharge Instructions (Signed)
It was our pleasure to provide your ER care today - we hope that you feel better.  Rest. Drink plenty of fluids.   Continue protonix. Avoid any alcohol use.   You may try pepcid and maalox as need for symptom relief.  Follow up with primary care doctor in the next 1-2 days for recheck if symptoms fail to improve/resolve.  Return to ER if worse, new symptoms, fevers, persistent vomiting, other concern.   You were given pain medication in the ER - no driving for the next 6 hours.

## 2017-01-15 ENCOUNTER — Encounter (HOSPITAL_COMMUNITY): Payer: Self-pay | Admitting: Emergency Medicine

## 2017-01-15 ENCOUNTER — Emergency Department (HOSPITAL_COMMUNITY): Payer: Self-pay

## 2017-01-15 ENCOUNTER — Inpatient Hospital Stay (HOSPITAL_COMMUNITY)
Admission: EM | Admit: 2017-01-15 | Discharge: 2017-01-19 | DRG: 439 | Disposition: A | Discharge status: Home / Self Care | Payer: Self-pay | Attending: Internal Medicine | Admitting: Internal Medicine

## 2017-01-15 DIAGNOSIS — R809 Proteinuria, unspecified: Secondary | ICD-10-CM | POA: Diagnosis present

## 2017-01-15 DIAGNOSIS — F1410 Cocaine abuse, uncomplicated: Secondary | ICD-10-CM | POA: Diagnosis present

## 2017-01-15 DIAGNOSIS — Z91041 Radiographic dye allergy status: Secondary | ICD-10-CM

## 2017-01-15 DIAGNOSIS — F1210 Cannabis abuse, uncomplicated: Secondary | ICD-10-CM | POA: Diagnosis present

## 2017-01-15 DIAGNOSIS — F17210 Nicotine dependence, cigarettes, uncomplicated: Secondary | ICD-10-CM | POA: Diagnosis present

## 2017-01-15 DIAGNOSIS — J45909 Unspecified asthma, uncomplicated: Secondary | ICD-10-CM | POA: Diagnosis present

## 2017-01-15 DIAGNOSIS — D573 Sickle-cell trait: Secondary | ICD-10-CM | POA: Diagnosis present

## 2017-01-15 DIAGNOSIS — E876 Hypokalemia: Secondary | ICD-10-CM | POA: Diagnosis present

## 2017-01-15 DIAGNOSIS — I10 Essential (primary) hypertension: Secondary | ICD-10-CM

## 2017-01-15 DIAGNOSIS — F1910 Other psychoactive substance abuse, uncomplicated: Secondary | ICD-10-CM

## 2017-01-15 DIAGNOSIS — M19021 Primary osteoarthritis, right elbow: Secondary | ICD-10-CM | POA: Diagnosis present

## 2017-01-15 DIAGNOSIS — F319 Bipolar disorder, unspecified: Secondary | ICD-10-CM | POA: Diagnosis present

## 2017-01-15 DIAGNOSIS — Z888 Allergy status to other drugs, medicaments and biological substances status: Secondary | ICD-10-CM

## 2017-01-15 DIAGNOSIS — I456 Pre-excitation syndrome: Secondary | ICD-10-CM

## 2017-01-15 DIAGNOSIS — K859 Acute pancreatitis without necrosis or infection, unspecified: Secondary | ICD-10-CM | POA: Diagnosis present

## 2017-01-15 DIAGNOSIS — F4310 Post-traumatic stress disorder, unspecified: Secondary | ICD-10-CM | POA: Diagnosis present

## 2017-01-15 DIAGNOSIS — K219 Gastro-esophageal reflux disease without esophagitis: Secondary | ICD-10-CM | POA: Diagnosis present

## 2017-01-15 DIAGNOSIS — R1013 Epigastric pain: Secondary | ICD-10-CM

## 2017-01-15 DIAGNOSIS — M19022 Primary osteoarthritis, left elbow: Secondary | ICD-10-CM | POA: Diagnosis present

## 2017-01-15 DIAGNOSIS — K860 Alcohol-induced chronic pancreatitis: Secondary | ICD-10-CM | POA: Diagnosis present

## 2017-01-15 DIAGNOSIS — Z8249 Family history of ischemic heart disease and other diseases of the circulatory system: Secondary | ICD-10-CM

## 2017-01-15 DIAGNOSIS — K861 Other chronic pancreatitis: Secondary | ICD-10-CM | POA: Diagnosis present

## 2017-01-15 DIAGNOSIS — Z7951 Long term (current) use of inhaled steroids: Secondary | ICD-10-CM

## 2017-01-15 DIAGNOSIS — G43909 Migraine, unspecified, not intractable, without status migrainosus: Secondary | ICD-10-CM | POA: Diagnosis present

## 2017-01-15 DIAGNOSIS — Z79899 Other long term (current) drug therapy: Secondary | ICD-10-CM

## 2017-01-15 DIAGNOSIS — M170 Bilateral primary osteoarthritis of knee: Secondary | ICD-10-CM | POA: Diagnosis present

## 2017-01-15 DIAGNOSIS — K852 Alcohol induced acute pancreatitis without necrosis or infection: Principal | ICD-10-CM | POA: Diagnosis present

## 2017-01-15 DIAGNOSIS — K863 Pseudocyst of pancreas: Secondary | ICD-10-CM

## 2017-01-15 DIAGNOSIS — Z91013 Allergy to seafood: Secondary | ICD-10-CM

## 2017-01-15 LAB — URINALYSIS, ROUTINE W REFLEX MICROSCOPIC
Bilirubin Urine: NEGATIVE
Glucose, UA: NEGATIVE mg/dL
Hgb urine dipstick: NEGATIVE
Ketones, ur: NEGATIVE mg/dL
Leukocytes, UA: NEGATIVE
Nitrite: NEGATIVE
Protein, ur: 100 mg/dL — AB
Specific Gravity, Urine: 1.025 (ref 1.005–1.030)
pH: 5 (ref 5.0–8.0)

## 2017-01-15 LAB — COMPREHENSIVE METABOLIC PANEL
ALBUMIN: 4.1 g/dL (ref 3.5–5.0)
ALT: 22 U/L (ref 17–63)
AST: 36 U/L (ref 15–41)
Alkaline Phosphatase: 107 U/L (ref 38–126)
Anion gap: 13 (ref 5–15)
BILIRUBIN TOTAL: 1 mg/dL (ref 0.3–1.2)
CHLORIDE: 101 mmol/L (ref 101–111)
CO2: 22 mmol/L (ref 22–32)
CREATININE: 0.74 mg/dL (ref 0.61–1.24)
Calcium: 8.9 mg/dL (ref 8.9–10.3)
GFR calc Af Amer: >60 mL/min (ref >60)
Glucose, Bld: 118 mg/dL — ABNORMAL HIGH (ref 65–99)
POTASSIUM: 3.6 mmol/L (ref 3.5–5.1)
Sodium: 136 mmol/L (ref 135–145)
TOTAL PROTEIN: 8.2 g/dL — AB (ref 6.5–8.1)

## 2017-01-15 LAB — RAPID URINE DRUG SCREEN, HOSP PERFORMED
Amphetamines: NOT DETECTED
BARBITURATES: POSITIVE — AB
Benzodiazepines: NOT DETECTED
COCAINE: POSITIVE — AB
Opiates: POSITIVE — AB
TETRAHYDROCANNABINOL: POSITIVE — AB

## 2017-01-15 LAB — CBC
HEMATOCRIT: 34.6 % — AB (ref 39.0–52.0)
Hemoglobin: 11.7 g/dL — ABNORMAL LOW (ref 13.0–17.0)
MCH: 27.9 pg (ref 26.0–34.0)
MCHC: 33.8 g/dL (ref 30.0–36.0)
MCV: 82.4 fL (ref 78.0–100.0)
PLATELETS: 218 10*3/uL (ref 150–400)
RBC: 4.2 MIL/uL — ABNORMAL LOW (ref 4.22–5.81)
RDW: 17.6 % — AB (ref 11.5–15.5)
WBC: 13.7 10*3/uL — AB (ref 4.0–10.5)

## 2017-01-15 LAB — LIPASE, BLOOD: Lipase: 184 U/L — ABNORMAL HIGH (ref 11–51)

## 2017-01-15 MED ORDER — ACETAMINOPHEN 650 MG RE SUPP: 650.0000 mg | Freq: Four times a day (QID) | RECTAL | Status: DC | PRN

## 2017-01-15 MED ORDER — BARIUM SULFATE 2.1 % PO SUSP
ORAL | Status: AC
  Filled 2017-01-15: qty 2

## 2017-01-15 MED ORDER — ACETAMINOPHEN 325 MG PO TABS
650.0000 mg | ORAL_TABLET | Freq: Four times a day (QID) | ORAL | Status: DC | PRN
  Administered 2017-01-18: 650 mg via ORAL
  Filled 2017-01-15: qty 2

## 2017-01-15 MED ORDER — ONDANSETRON HCL 4 MG PO TABS
4.0000 mg | ORAL_TABLET | Freq: Four times a day (QID) | ORAL | Status: DC | PRN
  Administered 2017-01-18: 4 mg via ORAL
  Filled 2017-01-15: qty 1

## 2017-01-15 MED ORDER — NICOTINE 21 MG/24HR TD PT24
21.0000 mg | MEDICATED_PATCH | Freq: Every day | TRANSDERMAL | Status: DC
  Administered 2017-01-16 – 2017-01-19 (×4): 21 mg via TRANSDERMAL
  Filled 2017-01-15 (×4): qty 1

## 2017-01-15 MED ORDER — LORAZEPAM 1 MG PO TABS: 0.0000 mg | ORAL_TABLET | Freq: Two times a day (BID) | ORAL | Status: DC

## 2017-01-15 MED ORDER — FENTANYL CITRATE (PF) 100 MCG/2ML IJ SOLN
50.0000 ug | Freq: Once | INTRAMUSCULAR | Status: AC
  Administered 2017-01-15: 50 ug via INTRAVENOUS
  Filled 2017-01-15: qty 2

## 2017-01-15 MED ORDER — LORAZEPAM 1 MG PO TABS: 1.0000 mg | ORAL_TABLET | Freq: Four times a day (QID) | ORAL | Status: DC | PRN

## 2017-01-15 MED ORDER — SODIUM CHLORIDE 0.9 % IV BOLUS (SEPSIS)
1000.0000 mL | Freq: Once | INTRAVENOUS | Status: AC
  Administered 2017-01-15: 1000 mL via INTRAVENOUS

## 2017-01-15 MED ORDER — PANTOPRAZOLE SODIUM 40 MG PO TBEC
40.0000 mg | DELAYED_RELEASE_TABLET | Freq: Every day | ORAL | Status: DC
Start: 2017-01-15 — End: 2017-01-15

## 2017-01-15 MED ORDER — ONDANSETRON HCL 4 MG/2ML IJ SOLN
4.0000 mg | Freq: Four times a day (QID) | INTRAMUSCULAR | Status: DC | PRN
  Administered 2017-01-17: 4 mg via INTRAVENOUS
  Filled 2017-01-15: qty 2

## 2017-01-15 MED ORDER — HALOPERIDOL 1 MG PO TABS
10.0000 mg | ORAL_TABLET | Freq: Every day | ORAL | Status: DC
  Filled 2017-01-15 (×2): qty 10

## 2017-01-15 MED ORDER — GI COCKTAIL ~~LOC~~
30.0000 mL | Freq: Once | ORAL | Status: AC
  Administered 2017-01-15: 30 mL via ORAL
  Filled 2017-01-15: qty 30

## 2017-01-15 MED ORDER — HYDRALAZINE HCL 25 MG PO TABS
25.0000 mg | ORAL_TABLET | Freq: Three times a day (TID) | ORAL | Status: DC
  Administered 2017-01-15 – 2017-01-19 (×13): 25 mg via ORAL
  Filled 2017-01-15 (×13): qty 1

## 2017-01-15 MED ORDER — VITAMIN B-1 100 MG PO TABS
100.0000 mg | ORAL_TABLET | Freq: Every day | ORAL | Status: DC
  Administered 2017-01-15 – 2017-01-19 (×5): 100 mg via ORAL
  Filled 2017-01-15 (×5): qty 1

## 2017-01-15 MED ORDER — METOPROLOL TARTRATE 25 MG PO TABS
25.0000 mg | ORAL_TABLET | Freq: Two times a day (BID) | ORAL | Status: DC
  Administered 2017-01-15 – 2017-01-19 (×9): 25 mg via ORAL
  Filled 2017-01-15 (×9): qty 1

## 2017-01-15 MED ORDER — ONDANSETRON HCL 4 MG/2ML IJ SOLN
4.0000 mg | Freq: Once | INTRAMUSCULAR | Status: AC
  Administered 2017-01-15: 4 mg via INTRAVENOUS
  Filled 2017-01-15: qty 2

## 2017-01-15 MED ORDER — LORAZEPAM 2 MG/ML IJ SOLN
1.0000 mg | Freq: Four times a day (QID) | INTRAMUSCULAR | Status: DC | PRN
  Administered 2017-01-16: 1 mg via INTRAVENOUS
  Filled 2017-01-15: qty 1

## 2017-01-15 MED ORDER — LORATADINE 10 MG PO TABS
10.0000 mg | ORAL_TABLET | Freq: Every day | ORAL | Status: DC
  Administered 2017-01-16 – 2017-01-19 (×4): 10 mg via ORAL
  Filled 2017-01-15 (×5): qty 1

## 2017-01-15 MED ORDER — ENOXAPARIN SODIUM 40 MG/0.4ML ~~LOC~~ SOLN
40.0000 mg | SUBCUTANEOUS | Status: DC
  Administered 2017-01-15 – 2017-01-17 (×3): 40 mg via SUBCUTANEOUS
  Filled 2017-01-15 (×3): qty 0.4

## 2017-01-15 MED ORDER — SODIUM CHLORIDE 0.9 % IV SOLN
INTRAVENOUS | Status: DC
  Administered 2017-01-15: 20:00:00 via INTRAVENOUS

## 2017-01-15 MED ORDER — LORAZEPAM 1 MG PO TABS
0.0000 mg | ORAL_TABLET | Freq: Four times a day (QID) | ORAL | Status: DC
  Administered 2017-01-15 – 2017-01-16 (×2): 2 mg via ORAL
  Administered 2017-01-16 – 2017-01-17 (×4): 1 mg via ORAL
  Administered 2017-01-17: 2 mg via ORAL
  Filled 2017-01-15: qty 2
  Filled 2017-01-15 (×2): qty 1
  Filled 2017-01-15: qty 2
  Filled 2017-01-15 (×2): qty 1
  Filled 2017-01-15: qty 2

## 2017-01-15 MED ORDER — ALBUTEROL SULFATE (2.5 MG/3ML) 0.083% IN NEBU: 3.0000 mL | INHALATION_SOLUTION | Freq: Four times a day (QID) | RESPIRATORY_TRACT | Status: DC | PRN

## 2017-01-15 MED ORDER — OXYCODONE-ACETAMINOPHEN 5-325 MG PO TABS: 1.0000 | ORAL_TABLET | Freq: Once | ORAL | Status: DC

## 2017-01-15 MED ORDER — FLUTICASONE PROPIONATE 50 MCG/ACT NA SUSP
1.0000 | Freq: Every day | NASAL | Status: DC | PRN
  Filled 2017-01-15: qty 16

## 2017-01-15 MED ORDER — GI COCKTAIL ~~LOC~~
30.0000 mL | Freq: Two times a day (BID) | ORAL | Status: DC | PRN
  Administered 2017-01-15 – 2017-01-18 (×7): 30 mL via ORAL
  Filled 2017-01-15 (×13): qty 30

## 2017-01-15 MED ORDER — PANCRELIPASE (LIP-PROT-AMYL) 12000-38000 UNITS PO CPEP
24000.0000 [IU] | ORAL_CAPSULE | Freq: Three times a day (TID) | ORAL | Status: DC
  Administered 2017-01-17 – 2017-01-19 (×7): 24000 [IU] via ORAL
  Filled 2017-01-15 (×9): qty 2

## 2017-01-15 MED ORDER — FOLIC ACID 1 MG PO TABS
1.0000 mg | ORAL_TABLET | Freq: Every day | ORAL | Status: DC
Start: 2017-01-15 — End: 2017-01-19
  Administered 2017-01-17 – 2017-01-19 (×3): 1 mg via ORAL
  Filled 2017-01-15 (×3): qty 1

## 2017-01-15 MED ORDER — THIAMINE HCL 100 MG/ML IJ SOLN: 100.0000 mg | Freq: Every day | INTRAMUSCULAR | Status: DC

## 2017-01-15 MED ORDER — GABAPENTIN 300 MG PO CAPS
300.0000 mg | ORAL_CAPSULE | Freq: Three times a day (TID) | ORAL | Status: DC
  Administered 2017-01-15 – 2017-01-19 (×11): 300 mg via ORAL
  Filled 2017-01-15 (×11): qty 1

## 2017-01-15 MED ORDER — SERTRALINE HCL 50 MG PO TABS
75.0000 mg | ORAL_TABLET | Freq: Every day | ORAL | Status: DC
  Administered 2017-01-15 – 2017-01-19 (×5): 75 mg via ORAL
  Filled 2017-01-15 (×5): qty 2

## 2017-01-15 MED ORDER — ADULT MULTIVITAMIN W/MINERALS CH
1.0000 | ORAL_TABLET | Freq: Every day | ORAL | Status: DC
  Administered 2017-01-16 – 2017-01-19 (×4): 1 via ORAL
  Filled 2017-01-15 (×4): qty 1

## 2017-01-15 MED ORDER — HYDROMORPHONE HCL 1 MG/ML IJ SOLN
1.0000 mg | INTRAMUSCULAR | Status: DC | PRN
  Administered 2017-01-15 – 2017-01-17 (×13): 1 mg via INTRAVENOUS
  Filled 2017-01-15 (×13): qty 1

## 2017-01-15 NOTE — ED Notes (Signed)
This nurse went to pt room, pt not there.  Found pt outside of ED door smoking.  Pt advised of smoking policy, pt verbalized understanding.

## 2017-01-15 NOTE — ED Notes (Signed)
Pt reports the ginger ale that was given to him earlier made him "almost vomit" and he doesn't think he can take pills at this time.  Dr. Thomasene Lot made aware.

## 2017-01-15 NOTE — Progress Notes (Signed)
Paged and s/w Dr. Nehemiah Settle regarding BP prior to pt arrival to dept from ED.  High in ED and was requesting IV prn BP med with parameters.  Dr. Nehemiah Settle stated that he had renewed his home meds for BP.
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
## 2017-01-15 NOTE — ED Triage Notes (Signed)
Pt brought to Ed by [facility] from home for c/o 9/10 abd pain with nausea, vomiting and diarrhea, no fever or chills.

## 2017-01-15 NOTE — H&P (Signed)
History and Physical  Jamin Panther BMW:[id number] DOB: 1970-12-17 DOA: 01/15/2017  Referring physician: Dr Thomasene Lot, ED physician PCP: Marliss Coots, NP  Outpatient Specialists:   Patient Coming From: home  Chief Complaint: Abdominal pain, nausea, vomiting.  HPI: Murtaza Alejos Reinhardt is a 46 y.o. male with a history of alcohol abuse, chronic pancreatitis with pseudocyst, GERD, polysubstance abuse, hypertension, WPW status post ablation, hyperlipidemia. The patient has been to the emergency department 18 times in the past 6 months, with 3 hospitalizations in the past 6 months. His last hospitalization was 12/17/16 due to acute on chronic pancreatitis with pseudocyst. He was discharged on 8/23, but went to the ER on 9/5 with similar complaints. He was treated and was stable for discharge to home without being admitted. Yesterday, the patient's states that his pain was returning after eating some fried chicken and french fries. The pain was so bad that he tried to drink some alcohol to take away his pain - his pain went away for a few hours and then returned. His last drink prior to this was 3 days ago per the patient.  Emergency Department Course: The patient received multiple doses of IV pain medicine, antiemetics, GI cocktail, IV fluids, but continued to have vomiting and intolerance of oral food and liquids.  Review of Systems:   Pt denies any fevers, chills, diarrhea, constipation, shortness of breath, dyspnea on exertion, orthopnea, cough, wheezing, palpitations, headache, vision changes, lightheadedness, dizziness, melena, rectal bleeding.  Review of systems are otherwise negative  Past Medical History:  Diagnosis Date  . Alcoholism /alcohol abuse ([place])   . Anemia   . Anxiety   . Arthritis    "knees; arms; elbows" (03/26/2015)  . Asthma   . Bipolar disorder ([place])   . Chronic bronchitis ([place])   . Chronic lower back pain   . Cocaine abuse   . Depression   . Family  history of adverse reaction to anesthesia    "grandmother gets confused"  . Femoral condyle fracture ([place]) 03/08/2014   left medial/notes 03/09/2014  . GERD (gastroesophageal reflux disease)   . H/O hiatal hernia   . H/O suicide attempt 10/2012  . Heart murmur    "when he was little" (03/06/2013)  . High cholesterol   . History of blood transfusion 10/2012   "when I tried to commit suicide"  . History of stomach ulcers   . Hypertension   . Marijuana abuse, continuous   . Migraine    "a few times/year" (03/26/2015)  . Pancreatitis   . Pneumonia 1990's X 3  . PTSD (post-traumatic stress disorder)   . Shortness of breath    "can happen at anytime" (03/06/2013)  . Sickle cell trait ([place])   . WPW (Wolff-Parkinson-White syndrome)    [name] 03/06/2013   Past Surgical History:  Procedure Laterality Date  . CARDIAC CATHETERIZATION    . EYE SURGERY Left 1990's   "result of trauma"   . FACIAL FRACTURE SURGERY Left 1990's   "result of trauma"   . FRACTURE SURGERY    . HERNIA REPAIR    . LEFT HEART CATHETERIZATION WITH CORONARY ANGIOGRAM Right 03/07/2013   Procedure: LEFT HEART CATHETERIZATION WITH CORONARY ANGIOGRAM;  Surgeon: [name], MD;  Location: [facility];  Service: Cardiovascular;  Laterality: Right;  . UMBILICAL HERNIA REPAIR     Social History:  reports that he has been smoking Cigarettes.  He has been smoking about 1.00 pack per day. He has never used smokeless tobacco.  He reports that he drinks alcohol. He reports that he does not use drugs. Patient lives at [place]  . Shellfish-Derived Products Nausea And Vomiting  . Trazodone And Nefazodone Other (See Comments)    Reaction:  Muscle spasms  . Robaxin [Methocarbamol] Other (See Comments)    "jumpy limbs"  . Contrast Media [Iodinated Diagnostic Agents] Hives  . Reglan [Metoclopramide] Other (See Comments)    Reaction:  Muscle spasms    Family History  Problem Relation Age of Onset    . Hypertension Mother   . Hypertension Father   . Hypertension Other   . Coronary artery disease Other       Prior to Admission medications   Medication Sig Start Date End Date Taking? Authorizing Provider  albuterol (PROVENTIL HFA;VENTOLIN HFA) 108 (90 Base) MCG/ACT inhaler Inhale 2 puffs into the lungs every 6 (six) hours as needed for wheezing or shortness of breath. 07/25/16  Yes Lindell Spar I, NP  cholestyramine (QUESTRAN) 4 g packet Take 1 packet (4 g total) by mouth 3 (three) times daily as needed (diarrhea). 07/25/16  Yes Lindell Spar I, NP  famotidine (PEPCID) 20 MG tablet Take 1 tablet (20 mg total) by mouth 2 (two) times daily. 08/10/16  Yes Charlesetta Shanks, MD  fluticasone (FLONASE) 50 MCG/ACT nasal spray Place 1 spray into both nostrils daily as needed for rhinitis. 07/25/16  Yes Lindell Spar I, NP  gabapentin (NEURONTIN) 300 MG capsule Take 1 capsule (300 mg total) by mouth 3 (three) times daily. For agitation Patient taking differently: Take 300 mg by mouth daily as needed. For agitation 07/25/16  Yes Lindell Spar I, NP  haloperidol (HALDOL) 10 MG tablet Take 10 mg by mouth at bedtime. 10/12/16  Yes [provider]  hydrALAZINE (APRESOLINE) 25 MG tablet Take 1 tablet (25 mg total) by mouth every 8 (eight) hours. Patient taking differently: Take 25 mg by mouth at bedtime.  12/29/16  Yes Barton Dubois, MD  hydrocerin (EUCERIN) CREA Apply 1 application topically 2 (two) times daily. For dry skin Patient taking differently: Apply 1 application topically 2 (two) times daily as needed. For dry skin 07/25/16  Yes Nwoko, Herbert Pun I, NP  ibuprofen (ADVIL,MOTRIN) 400 MG tablet Take 1 tablet (400 mg total) by mouth every 8 (eight) hours as needed for moderate pain. 12/29/16  Yes Barton Dubois, MD  lipase/protease/amylase (CREON) 12000 units CPEP capsule Take 2 capsules (24,000 Units total) by mouth 3 (three) times daily with meals. For pancreatitis 12/29/16  Yes Barton Dubois, MD  loratadine  (CLARITIN) 10 MG tablet Take 1 tablet (10 mg total) by mouth daily. (May purchase from over the counter): For allergies 07/26/16  Yes Lindell Spar I, NP  metoprolol tartrate (LOPRESSOR) 25 MG tablet Take 1 tablet (25 mg total) by mouth 2 (two) times daily. For high blood pressure 12/29/16  Yes Barton Dubois, MD  ondansetron (ZOFRAN ODT) 4 MG disintegrating tablet Take 1 tablet (4 mg total) by mouth every 8 (eight) hours as needed for nausea or vomiting. 12/29/16  Yes Barton Dubois, MD  oxyCODONE (ROXICODONE) 5 MG immediate release tablet Take 1 tablet (5 mg total) by mouth every 8 (eight) hours as needed for severe pain. 12/29/16 12/29/17 Yes Barton Dubois, MD  pantoprazole (PROTONIX) 40 MG tablet Take 1 tablet (40 mg total) by mouth daily. 12/29/16  Yes Barton Dubois, MD  sertraline (ZOLOFT) 25 MG tablet Take 3 tablets (75 mg total) by mouth daily. For depression 07/26/16  Yes  Lindell Spar I, NP  thiamine 100 MG tablet Take 1 tablet (100 mg total) by mouth daily. 12/30/16  Yes Barton Dubois, MD  Alum & Mag Hydroxide-Simeth (GI COCKTAIL) SUSP suspension Take 30 mLs by mouth 2 (two) times daily as needed for indigestion. Shake well. 12/29/16   Barton Dubois, MD    Physical Exam: BP (!) 159/109   Pulse 88   Temp 97.9 F (36.6 C) (Oral)   Resp 16   Ht 5\' 9"  (1.753 m)   Wt 58.1 kg (128 lb)   SpO2 100%   BMI 18.90 kg/m   General: Middle-aged black male. Awake and alert and oriented x3. No acute cardiopulmonary distress.  HEENT: Normocephalic atraumatic.  Right and left ears normal in appearance.  Pupils equal, round, reactive to light. Extraocular muscles are intact. Sclerae anicteric and noninjected.  Moist mucosal membranes. No mucosal lesions.  Neck: Neck supple without lymphadenopathy. No carotid bruits. No masses palpated.  Cardiovascular: Regular rate with normal S1-S2 sounds. No murmurs, rubs, gallops auscultated. No JVD.  Respiratory: Good respiratory effort with no wheezes, rales, rhonchi.  Lungs clear to auscultation bilaterally.  No accessory muscle use. Abdomen: Epigastric and left upper quadrant abdominal pain without guarding or rebound. Nondistended. Active bowel sounds. No masses or hepatosplenomegaly  Skin: No rashes, lesions, or ulcerations.  Dry, warm to touch. 2+ dorsalis pedis and radial pulses. Musculoskeletal: No calf or leg pain. All major joints not erythematous nontender.  No upper or lower joint deformation.  Good ROM.  No contractures  Psychiatric: Intact judgment and insight. Pleasant and cooperative. Neurologic: No focal neurological deficits. Strength is 5/5 and symmetric in upper and lower extremities.  Cranial nerves II through XII are grossly intact.           Labs on Admission: I have personally reviewed following labs and imaging studies  CBC:  Recent Labs Lab 01/11/17 0833 01/15/17 0546  WBC 8.2 13.7*  HGB 11.5* 11.7*  HCT 34.9* 34.6*  MCV 81.7 82.4  PLT 355 322   Basic Metabolic Panel:  Recent Labs Lab 01/11/17 0833 01/15/17 0546  NA 135 136  K 4.6 3.6  CL 105 101  CO2 20* 22  GLUCOSE 98 118*  BUN <5* <5*  CREATININE 0.71 0.74  CALCIUM 8.8* 8.9   GFR: Estimated Creatinine Clearance: 93.8 mL/min (by C-G formula based on SCr of 0.74 mg/dL). Liver Function Tests:  Recent Labs Lab 01/11/17 0833 01/15/17 0546  AST 51* 36  ALT 33 22  ALKPHOS 97 107  BILITOT 0.7 1.0  PROT 7.9 8.2*  ALBUMIN 4.0 4.1    Recent Labs Lab 01/11/17 0833 01/15/17 0546  LIPASE 143* 184*   No results for input(s): AMMONIA in the last 168 hours. Coagulation Profile: No results for input(s): INR, PROTIME in the last 168 hours. Cardiac Enzymes: No results for input(s): CKTOTAL, CKMB, CKMBINDEX, TROPONINI in the last 168 hours. BNP (last 3 results) No results for input(s): PROBNP in the last 8760 hours. HbA1C: No results for input(s): HGBA1C in the last 72 hours. CBG: No results for input(s): GLUCAP in the last 168 hours. Lipid Profile: No  results for input(s): CHOL, HDL, LDLCALC, TRIG, CHOLHDL, LDLDIRECT in the last 72 hours. Thyroid Function Tests: No results for input(s): TSH, T4TOTAL, FREET4, T3FREE, THYROIDAB in the last 72 hours. Anemia Panel: No results for input(s): VITAMINB12, FOLATE, FERRITIN, TIBC, IRON, RETICCTPCT in the last 72 hours. Urine analysis:    Component Value Date/Time   COLORURINE AMBER (A) 01/15/2017  [place] (A) 01/15/2017 0547   LABSPEC 1.025 01/15/2017 0547   PHURINE 5.0 01/15/2017 0547   GLUCOSEU NEGATIVE 01/15/2017 0547   HGBUR NEGATIVE 01/15/2017 0547   HGBUR negative 04/30/2010 1020   BILIRUBINUR NEGATIVE 01/15/2017 0547   KETONESUR NEGATIVE 01/15/2017 0547   PROTEINUR 100 (A) 01/15/2017 0547   UROBILINOGEN 0.2 03/15/2015 0508   NITRITE NEGATIVE 01/15/2017 0547   LEUKOCYTESUR NEGATIVE 01/15/2017 0547   Sepsis Labs: @LABRCNTIP (procalcitonin:4,lacticidven:4) )No results found for this or any previous visit (from the past 240 hour(s)).   Radiological Exams on Admission: Ct Abdomen Pelvis Wo Contrast  Result Date: 01/15/2017 CLINICAL DATA:  Abdominal pain with nausea, vomiting and diarrhea. EXAM: CT ABDOMEN AND PELVIS WITHOUT CONTRAST TECHNIQUE: Multidetector CT imaging of the abdomen and pelvis was performed following the standard protocol without IV contrast. COMPARISON:  CT abdomen dated 12/26/2016. FINDINGS: Lower chest: No acute abnormality. Hepatobiliary: No focal liver abnormality is seen. No gallstones, gallbladder wall thickening, or biliary dilatation. Pancreas: Calcifications again noted within the pancreatic head indicating sequela of chronic pancreatitis. Cystic mass within the pancreatic body measures 2.5 cm greatest dimension, not significantly changed compared to the most recent CT of 12/26/2016, slightly enlarged compared the earlier CT of 07/16/2016, presumed chronic pseudocyst. Ill-defined fluid/edema about the pancreatic tail extending to the left upper  quadrant, similar to appearance on recent CT. Spleen: Normal in size without focal abnormality. Adrenals/Urinary Tract: Adrenal glands are unremarkable. Kidneys are normal, without renal calculi, focal lesion, or hydronephrosis. Bladder is unremarkable. Stomach/Bowel: Bowel is normal in caliber. No evidence of appendicitis. Walls of the stomach body again appear somewhat thickened, but possibly artifactual related to inadequate distention. Vascular/Lymphatic: Aortic atherosclerosis. No enlarged lymph nodes appreciated. Reproductive: Prostate is unremarkable. Other: Small amount of free fluid in the lower pelvis. No free intraperitoneal air seen. Musculoskeletal: Mild degenerative change at the lumbosacral junction. No acute or suspicious osseous finding. IMPRESSION: 1. Ill-defined fluid/edema about the pancreatic tail, suggesting acute on chronic pancreatitis, similar to the appearance on recent CT abdomen of 12/26/2016. Cystic appearing lesion within the pancreatic body is not significantly changed in size or configuration, measuring 2.5 cm greatest dimension, most likely chronic pseudocyst as also suggested on earlier reports. 2. Walls of the stomach body and fundus appear thickened, similar to appearance on earlier CT, but difficult to characterize due to the inadequate stomach distension. Reactive thickening is a possibility given the adjacent pancreatitis. 3. No bowel obstruction. 4. Aortic atherosclerosis. Electronically Signed   By: Franki Cabot M.D.   On: 01/15/2017 09:28    Assessment/Plan: Principal Problem:   Acute on chronic pancreatitis ([place]) Active Problems:   [name] White pattern seen on electrocardiogram   Benign essential HTN   Polysubstance abuse (tobacco, cocaine, THC, and ETOH)   Pseudocyst of pancreas    This patient was discussed with the ED physician, including pertinent vitals, physical exam findings, labs, and imaging.  We also discussed care given by the ED  provider.  #1 acute on chronic pancreatitis  Admit  Nothing by mouth  Pain control  IV fluids  Recheck lipase tomorrow  If symptoms not improving, consult GI #2 polysubstance abuse  UDS  CIWA #3 hypertension  Continue antihypertensives #4 chronic pain  DVT prophylaxis: Lovenox Consultants: None Code Status: Full code Family Communication: None  Disposition Plan: Home   Truett Mainland, [place] [facility] Pager [phone]  If 7PM-7AM, please contact night-coverage [email or web address] Password [email or web address]

## 2017-01-15 NOTE — ED Notes (Signed)
Pt given a ginger ale and encouraged to drink it, per Angela Nevin, Therapist, sports.

## 2017-01-15 NOTE — Progress Notes (Signed)
Pt admitted into 6N28 from ED for abd pain/pancreatitis.  Oriented to dept.

## 2017-01-15 NOTE — ED Notes (Signed)
Report attempted 

## 2017-01-16 ENCOUNTER — Encounter (HOSPITAL_COMMUNITY): Payer: Self-pay | Admitting: *Deleted

## 2017-01-16 LAB — BASIC METABOLIC PANEL
ANION GAP: 7 (ref 5–15)
BUN: <5 mg/dL — ABNORMAL LOW (ref 6–20)
CALCIUM: 8 mg/dL — AB (ref 8.9–10.3)
CO2: 22 mmol/L (ref 22–32)
CREATININE: 0.53 mg/dL — AB (ref 0.61–1.24)
Chloride: 110 mmol/L (ref 101–111)
GLUCOSE: 95 mg/dL (ref 65–99)
Potassium: 3.2 mmol/L — ABNORMAL LOW (ref 3.5–5.1)
Sodium: 139 mmol/L (ref 135–145)

## 2017-01-16 LAB — LIPASE, BLOOD: Lipase: 126 U/L — ABNORMAL HIGH (ref 11–51)

## 2017-01-16 LAB — MAGNESIUM: Magnesium: 1.4 mg/dL — ABNORMAL LOW (ref 1.7–2.4)

## 2017-01-16 MED ORDER — OXYCODONE HCL 5 MG PO TABS: 10.0000 mg | ORAL_TABLET | ORAL | Status: DC | PRN

## 2017-01-16 MED ORDER — POTASSIUM CHLORIDE CRYS ER 20 MEQ PO TBCR
40.0000 meq | EXTENDED_RELEASE_TABLET | Freq: Two times a day (BID) | ORAL | Status: AC
  Administered 2017-01-16 (×2): 40 meq via ORAL
  Filled 2017-01-16 (×2): qty 2

## 2017-01-16 MED ORDER — OXYCODONE HCL 5 MG PO TABS: 5.0000 mg | ORAL_TABLET | ORAL | Status: DC | PRN

## 2017-01-16 MED ORDER — LACTATED RINGERS IV SOLN
INTRAVENOUS | Status: DC
  Administered 2017-01-16 – 2017-01-17 (×4): via INTRAVENOUS
  Administered 2017-01-17: 1 mL via INTRAVENOUS
  Administered 2017-01-18 – 2017-01-19 (×4): via INTRAVENOUS

## 2017-01-16 MED ORDER — MAGNESIUM SULFATE 4 GM/100ML IV SOLN
4.0000 g | Freq: Once | INTRAVENOUS | Status: AC
Start: 2017-01-16 — End: 2017-01-16
  Administered 2017-01-16: 4 g via INTRAVENOUS
  Filled 2017-01-16: qty 100

## 2017-01-16 NOTE — Care Management Note (Signed)
Case Management Note  Patient Details  Name: Calvin Jablonowski MRN: [id number] Date of Birth: 1971/05/05  Subjective/Objective:                    Action/Plan:  Patient active with   [facility] on [address] Expected Discharge Date:                  Expected Discharge Plan:     In-House Referral:     Discharge planning Services  CM Consult, Medication Assistance  Post Acute Care Choice:    Choice offered to:  Patient  DME Arranged:    DME Agency:     HH Arranged:    [place] Agency:     Status of Service:  In process, will continue to follow  If discussed at Long Length of Stay Meetings, dates discussed:    Additional Comments:  Marilu Favre, RN 01/16/2017, 11:09 AM

## 2017-01-16 NOTE — Progress Notes (Signed)
Pt has been complaining of abdominal pain, prn diluadid was given q3hrs. Pt walked around unit several times. Dr. Florene Glen changed his diet to clear liquid, pt said stomach hurt after eating jello, Dr. Florene Glen was notified and pt's is NPO again.

## 2017-01-16 NOTE — Progress Notes (Signed)
   01/16/17 1700  Clinical Encounter Type  Visited With Patient  Visit Type Spiritual support  Referral From Nurse  Consult/Referral To Chaplain  Spiritual Encounters  Spiritual Needs Prayer;Emotional;Grief support  Stress Factors  Patient Stress Factors Major life changes  Family Stress Factors None identified  Patient requested prayer because he knows that he needs to turn his life around.  His drinking has caused him many issues including fines in court.  Chaplain talked about the patients life including past male relationships that caused him to suffer from depression.  No follow  Up needed as patient requested prayer only

## 2017-01-16 NOTE — Progress Notes (Signed)
PROGRESS NOTE    Javar Eshbach  BJY:[id number] DOB: [birth year] DOA: 01/15/2017 PCP: Marliss Coots, NP (Confirm with patient/family/NH records and if not entered, this HAS to be entered at [facility] point of entry. "No PCP" if truly none.)   Brief Narrative: (Start on day 1 of progress note - keep it brief and live) Senay Emileo Semel is a 46 y.o. male with a history of alcohol abuse, chronic pancreatitis with pseudocyst, GERD, polysubstance abuse, hypertension, WPW status post ablation, hyperlipidemia. The patient has been to the emergency department 18 times in the past 6 months, with 3 hospitalizations in the past 6 months. His last hospitalization was 12/17/16 due to acute on chronic pancreatitis with pseudocyst. He was discharged on 8/23, but went to the ER on 9/5 with similar complaints. He was treated and was stable for discharge to home without being admitted. Yesterday, the patient's states that his pain was returning after eating some fried chicken and french fries. The pain was so bad that he tried to drink some alcohol to take away his pain - his pain went away for a few hours and then returned. His last drink prior to this was 3 days ago per the patient.   Assessment & Plan:   Principal Problem:   Acute on chronic pancreatitis (HCC) Active Problems:   [name] Parkinson White pattern seen on electrocardiogram   Benign essential HTN   Polysubstance abuse (tobacco, cocaine, THC, and ETOH)   Pseudocyst of pancreas   1. acute on chronic pancreatitis 2/2 etoh use - Nothing by mouth, if continued improvement, can trial clears at dinner - Pain control - continue IV meds until tolerating PO - IV fluids, continue LR - If symptoms not improving, consult GI - ctm stable pseudocyst - thickening of walls and body of stomach likely reactive? ctm 2. polysubstance abuse - UDS - CIWA - SW for resources - encourage cessation 3. hypertension - Continue antihypertensives 4. chronic  pain - pain control as above 5. Hypokalemia/Hypomag - replete prn 6. Proteinuria - repeat outpatient, f/u 7. Leukocytosis - continue to monitor for si/sx of infection   DVT prophylaxis: lovenox Code Status: Full Family Communication: none in room Disposition Plan: Home   Consultants:   none  Procedures: (Don't include imaging studies which can be auto populated. Include things that cannot be auto populated i.e. Echo, Carotid and venous dopplers, Foley, Bipap, HD, tubes/drains, wound vac, central lines etc)  none  Antimicrobials: (specify start and planned stop date. Auto populated tables are space occupying and do not give end dates)  none    Subjective: C/o abdominal pain, seems a bit improved from when he came in 6 compared to a 10.   Would be ok trying clears later this afternoon.    Objective: Vitals:   01/15/17 1646 01/15/17 1647 01/15/17 2111 01/16/17 0505  BP: (!) 176/109  (!) 171/103 (!) 147/99  Pulse: 92  92 100  Resp:   18 18  Temp:  97.9 F (36.6 C) 97.7 F (36.5 C) (!) 97.5 F (36.4 C)  TempSrc: Oral  Oral Oral  SpO2: 100%  100% 99%  Weight:  57.6 kg (126 lb 14.4 oz)    Height:        Intake/Output Summary (Last 24 hours) at 01/16/17 1024 Last data filed at 01/16/17 0600  Gross per 24 hour  Intake             3660 ml  Output  1325 ml  Net             2335 ml   Filed Weights   01/15/17 0540 01/15/17 1647  Weight: 58.1 kg (128 lb) 57.6 kg (126 lb 14.4 oz)    Examination:  General exam: Appears calm and comfortable  Respiratory system: Clear to auscultation. Respiratory effort normal. Cardiovascular system: S1 & S2 heard, RRR. No JVD, murmurs, rubs, gallops or clicks. No pedal edema. Gastrointestinal system: Abdomen is nondistended, soft and diffusely tender to palpation, no rebound or guarding. No organomegaly or masses felt. Normal bowel sounds heard. Central nervous system: Alert and oriented. No focal neurological  deficits. Extremities: Symmetric 5 x 5 power. Skin: No rashes, lesions or ulcers Psychiatry: Judgement and insight appear normal. Mood & affect appropriate.     Data Reviewed: I have personally reviewed following labs and imaging studies  CBC:  Recent Labs Lab 01/11/17 0833 01/15/17 0546  WBC 8.2 13.7*  HGB 11.5* 11.7*  HCT 34.9* 34.6*  MCV 81.7 82.4  PLT 355 875   Basic Metabolic Panel:  Recent Labs Lab 01/11/17 0833 01/15/17 0546 01/16/17 0424  NA 135 136 139  K 4.6 3.6 3.2*  CL 105 101 110  CO2 20* 22 22  GLUCOSE 98 118* 95  BUN <5* <5* <5*  CREATININE 0.71 0.74 0.53*  CALCIUM 8.8* 8.9 8.0*   GFR: Estimated Creatinine Clearance: 93 mL/min (A) (by C-G formula based on SCr of 0.53 mg/dL (L)). Liver Function Tests:  Recent Labs Lab 01/11/17 0833 01/15/17 0546  AST 51* 36  ALT 33 22  ALKPHOS 97 107  BILITOT 0.7 1.0  PROT 7.9 8.2*  ALBUMIN 4.0 4.1    Recent Labs Lab 01/11/17 0833 01/15/17 0546 01/16/17 0424  LIPASE 143* 184* 126*   No results for input(s): AMMONIA in the last 168 hours. Coagulation Profile: No results for input(s): INR, PROTIME in the last 168 hours. Cardiac Enzymes: No results for input(s): CKTOTAL, CKMB, CKMBINDEX, TROPONINI in the last 168 hours. BNP (last 3 results) No results for input(s): PROBNP in the last 8760 hours. HbA1C: No results for input(s): HGBA1C in the last 72 hours. CBG: No results for input(s): GLUCAP in the last 168 hours. Lipid Profile: No results for input(s): CHOL, HDL, LDLCALC, TRIG, CHOLHDL, LDLDIRECT in the last 72 hours. Thyroid Function Tests: No results for input(s): TSH, T4TOTAL, FREET4, T3FREE, THYROIDAB in the last 72 hours. Anemia Panel: No results for input(s): VITAMINB12, FOLATE, FERRITIN, TIBC, IRON, RETICCTPCT in the last 72 hours. Sepsis Labs: No results for input(s): PROCALCITON, LATICACIDVEN in the last 168 hours.  No results found for this or any previous visit (from the past 240  hour(s)).       Radiology Studies: Ct Abdomen Pelvis Wo Contrast  Result Date: 01/15/2017 CLINICAL DATA:  Abdominal pain with nausea, vomiting and diarrhea. EXAM: CT ABDOMEN AND PELVIS WITHOUT CONTRAST TECHNIQUE: Multidetector CT imaging of the abdomen and pelvis was performed following the standard protocol without IV contrast. COMPARISON:  CT abdomen dated 12/26/2016. FINDINGS: Lower chest: No acute abnormality. Hepatobiliary: No focal liver abnormality is seen. No gallstones, gallbladder wall thickening, or biliary dilatation. Pancreas: Calcifications again noted within the pancreatic head indicating sequela of chronic pancreatitis. Cystic mass within the pancreatic body measures 2.5 cm greatest dimension, not significantly changed compared to the most recent CT of 12/26/2016, slightly enlarged compared the earlier CT of 07/16/2016, presumed chronic pseudocyst. Ill-defined fluid/edema about the pancreatic tail extending to the left upper quadrant, similar to  appearance on recent CT. Spleen: Normal in size without focal abnormality. Adrenals/Urinary Tract: Adrenal glands are unremarkable. Kidneys are normal, without renal calculi, focal lesion, or hydronephrosis. Bladder is unremarkable. Stomach/Bowel: Bowel is normal in caliber. No evidence of appendicitis. Walls of the stomach body again appear somewhat thickened, but possibly artifactual related to inadequate distention. Vascular/Lymphatic: Aortic atherosclerosis. No enlarged lymph nodes appreciated. Reproductive: Prostate is unremarkable. Other: Small amount of free fluid in the lower pelvis. No free intraperitoneal air seen. Musculoskeletal: Mild degenerative change at the lumbosacral junction. No acute or suspicious osseous finding. IMPRESSION: 1. Ill-defined fluid/edema about the pancreatic tail, suggesting acute on chronic pancreatitis, similar to the appearance on recent CT abdomen of 12/26/2016. Cystic appearing lesion within the pancreatic body  is not significantly changed in size or configuration, measuring 2.5 cm greatest dimension, most likely chronic pseudocyst as also suggested on earlier reports. 2. Walls of the stomach body and fundus appear thickened, similar to appearance on earlier CT, but difficult to characterize due to the inadequate stomach distension. Reactive thickening is a possibility given the adjacent pancreatitis. 3. No bowel obstruction. 4. Aortic atherosclerosis. Electronically Signed   By: Franki Cabot M.D.   On: 01/15/2017 09:28        Scheduled Meds: . enoxaparin (LOVENOX) injection  40 mg Subcutaneous Q24H  . folic acid  1 mg Oral Daily  . gabapentin  300 mg Oral TID  . haloperidol  10 mg Oral QHS  . hydrALAZINE  25 mg Oral Q8H  . lipase/protease/amylase  24,000 Units Oral TID WC  . loratadine  10 mg Oral Daily  . LORazepam  0-4 mg Oral Q6H   Followed by  . [START ON 01/17/2017] LORazepam  0-4 mg Oral Q12H  . metoprolol tartrate  25 mg Oral BID  . multivitamin with minerals  1 tablet Oral Daily  . nicotine  21 mg Transdermal Daily  . oxyCODONE-acetaminophen  1 tablet Oral Once  . potassium chloride  40 mEq Oral BID  . sertraline  75 mg Oral Daily  . thiamine  100 mg Oral Daily   Or  . thiamine  100 mg Intravenous Daily   Continuous Infusions: . lactated ringers       LOS: 1 day    Time spent: 35 min    A Melven Sartorius, MD [facility] [phone]   If 7PM-7AM, please contact night-coverage [email or web address] Password [email or web address] 01/16/2017, 10:24 AM

## 2017-01-17 LAB — BASIC METABOLIC PANEL
Anion gap: 4 — ABNORMAL LOW (ref 5–15)
CALCIUM: 8.3 mg/dL — AB (ref 8.9–10.3)
CHLORIDE: 108 mmol/L (ref 101–111)
CO2: 26 mmol/L (ref 22–32)
CREATININE: 0.58 mg/dL — AB (ref 0.61–1.24)
GFR calc non Af Amer: >60 mL/min (ref >60)
GLUCOSE: 88 mg/dL (ref 65–99)
Potassium: 3.8 mmol/L (ref 3.5–5.1)
Sodium: 138 mmol/L (ref 135–145)

## 2017-01-17 LAB — MAGNESIUM: MAGNESIUM: 2.3 mg/dL (ref 1.7–2.4)

## 2017-01-17 LAB — CBC
HCT: 30.6 % — ABNORMAL LOW (ref 39.0–52.0)
Hemoglobin: 9.7 g/dL — ABNORMAL LOW (ref 13.0–17.0)
MCH: 26.9 pg (ref 26.0–34.0)
MCHC: 31.7 g/dL (ref 30.0–36.0)
MCV: 85 fL (ref 78.0–100.0)
PLATELETS: 172 10*3/uL (ref 150–400)
RBC: 3.6 MIL/uL — ABNORMAL LOW (ref 4.22–5.81)
RDW: 17.8 % — AB (ref 11.5–15.5)
WBC: 9.6 10*3/uL (ref 4.0–10.5)

## 2017-01-17 MED ORDER — OXYCODONE HCL 5 MG PO TABS
10.0000 mg | ORAL_TABLET | Freq: Once | ORAL | Status: AC
  Administered 2017-01-17: 10 mg via ORAL
  Filled 2017-01-17: qty 2

## 2017-01-17 MED ORDER — OXYCODONE HCL 5 MG PO TABS
15.0000 mg | ORAL_TABLET | ORAL | Status: DC | PRN
  Administered 2017-01-19: 15 mg via ORAL
  Filled 2017-01-17: qty 3

## 2017-01-17 MED ORDER — OXYCODONE HCL 5 MG PO TABS
10.0000 mg | ORAL_TABLET | ORAL | Status: DC | PRN
  Administered 2017-01-17 – 2017-01-19 (×9): 10 mg via ORAL
  Filled 2017-01-17 (×9): qty 2

## 2017-01-17 MED ORDER — HYDROMORPHONE HCL 1 MG/ML IJ SOLN: 1.0000 mg | Freq: Four times a day (QID) | INTRAMUSCULAR | Status: DC | PRN

## 2017-01-17 MED ORDER — HYDROMORPHONE HCL 1 MG/ML IJ SOLN
1.0000 mg | Freq: Four times a day (QID) | INTRAMUSCULAR | Status: AC | PRN
  Administered 2017-01-17 – 2017-01-18 (×4): 1 mg via INTRAVENOUS
  Filled 2017-01-17 (×4): qty 1

## 2017-01-17 MED ORDER — OXYCODONE HCL 5 MG PO TABS
5.0000 mg | ORAL_TABLET | ORAL | Status: DC | PRN
  Administered 2017-01-17: 5 mg via ORAL
  Filled 2017-01-17: qty 1

## 2017-01-17 MED ORDER — OXYCODONE HCL 5 MG PO TABS: 10.0000 mg | ORAL_TABLET | ORAL | Status: DC | PRN

## 2017-01-17 MED ORDER — HYDROMORPHONE HCL 1 MG/ML IJ SOLN: 0.5000 mg | Freq: Four times a day (QID) | INTRAMUSCULAR | Status: DC | PRN

## 2017-01-17 NOTE — Progress Notes (Signed)
Patient was missing during shift report but was seen earlier by secretary walking around.  Secretary called security to locate patient.  Bryn Gulling, mobile tech just inform this RN that she saw patient at the parking deck smoking.  Security was informed to get patient back to the floor.  Will endorse appropriately to day shift RN.

## 2017-01-17 NOTE — Progress Notes (Addendum)
PROGRESS NOTE    [name]  ZJQ:[id number] DOB: 04/27/1971 DOA: 01/15/2017 PCP: Jason Coots, NP (Confirm with patient/family/NH records and if not entered, this HAS to be entered at [facility] point of entry. "No PCP" if truly none.)   Brief Narrative: (Start on day 1 of progress note - keep it brief and live) [name] is a 46 y.o. male with a history of alcohol abuse, chronic pancreatitis with pseudocyst, GERD, polysubstance abuse, hypertension, WPW status post ablation, hyperlipidemia. The patient has been to the emergency department 18 times in the past 6 months, with 3 hospitalizations in the past 6 months. His last hospitalization was 12/17/16 due to acute on chronic pancreatitis with pseudocyst. He was discharged on 8/23, but went to the ER on 9/5 with similar complaints. He was treated and was stable for discharge to home without being admitted. Yesterday, the patient's states that his pain was returning after eating some fried chicken and french fries. The pain was so bad that he tried to drink some alcohol to take away his pain - his pain went away for a few hours and then returned. His last drink prior to this was 3 days ago per the patient.   Assessment & Plan:   Principal Problem:   Acute on chronic pancreatitis (HCC) Active Problems:   [name] Parkinson White pattern seen on electrocardiogram   Benign essential HTN   Polysubstance abuse (tobacco, cocaine, THC, and ETOH)   Pseudocyst of pancreas   1. acute on chronic pancreatitis 2/2 etoh use  - did not tolerate clears hospital day 1, trial PO clears today - Pain control, trial PO - He's been tolerating PO meds.  Discussed trying PO pain meds as well.  Dilaudid left on for breakthrough pain.  - IV fluids, continue LR, decrease to 125 cc/hr - If symptoms not improving, consult GI - ctm stable pseudocyst - thickening of walls and body of stomach likely reactive? ctm  2. polysubstance abuse - UDS - CIWA -  he was very sleepy when I saw him today, has withdrawn before, no seizures, he says just tremors.  Last drink was Saturday, so about 3 days out.  I haven't noticed any sx of withdrawal when I've seen him so far.  Will have nursing call before administering additional ativan.   - SW for resources - encourage cessation  3. hypertension - Continue antihypertensives  4. chronic pain - pain control as above  5. Hypokalemia/Hypomag - replete prn  6. Proteinuria - repeat outpatient, f/u  7. Leukocytosis - resolved  Couldn't find patient earlier because he was off the unit.  I discussed the importance not leaving the unit without telling anyone and notifying the nurses if he needed anything or needed to go somewhere.  Offered nicotine patch, which he said he had.     DVT prophylaxis: lovenox Code Status: Full Family Communication: none in room Disposition Plan: Home   Consultants:   none  Procedures: (Don't include imaging studies which can be auto populated. Include things that cannot be auto populated i.e. Echo, Carotid and venous dopplers, Foley, Bipap, HD, tubes/drains, wound vac, central lines etc)  none  Antimicrobials: (specify start and planned stop date. Auto populated tables are space occupying and do not give end dates)  none    Subjective: Snoring when I entered. Difficult to arouse.  Still c/o pain, nausea.    Objective: Vitals:   01/16/17 1400 01/16/17 1641 01/16/17 2002 01/17/17 0545  BP: (!) 151/98 [name])  160/98 (!) 170/98 (!) 158/93  Pulse:  90 99 88  Resp:  18 17 19   Temp:  97.8 F (36.6 C) 97.7 F (36.5 C) 98.4 F (36.9 C)  TempSrc:  Oral Oral Oral  SpO2:  100% 91% 96%  Weight:      Height:        Intake/Output Summary (Last 24 hours) at 01/17/17 1259 Last data filed at 01/17/17 1100  Gross per 24 hour  Intake           4627.5 ml  Output             2625 ml  Net           2002.5 ml   Filed Weights   01/15/17 0540 01/15/17 1647  Weight:  58.1 kg (128 lb) 57.6 kg (126 lb 14.4 oz)    Examination:  General: No acute distress.  Seen walking throughout unit yesterday and today.   Cardiovascular: Heart sounds show a regular rate, and rhythm. No gallops or rubs. No murmurs. No JVD. Lungs: Clear to auscultation bilaterally with good air movement. No rales, rhonchi or wheezes. Abdomen: Soft, mildly tender diffusely. Soft bowel sounds. No masses. No hepatosplenomegaly. Neurological: Alert and oriented 3. Moves all extremities 4 with equal strength. Cranial nerves II through XII grossly intact. Skin: Warm and dry. No rashes or lesions. Extremities: No clubbing or cyanosis. No edema. Pedal pulses 2+.  Data Reviewed: I have personally reviewed following labs and imaging studies  CBC:  Recent Labs Lab 01/11/17 0833 01/15/17 0546 01/17/17 0309  WBC 8.2 13.7* 9.6  HGB 11.5* 11.7* 9.7*  HCT 34.9* 34.6* 30.6*  MCV 81.7 82.4 85.0  PLT 355 218 449   Basic Metabolic Panel:  Recent Labs Lab 01/11/17 0833 01/15/17 0546 01/16/17 0424 01/16/17 1010 01/17/17 0309  NA 135 136 139  --  138  K 4.6 3.6 3.2*  --  3.8  CL 105 101 110  --  108  CO2 20* 22 22  --  26  GLUCOSE 98 118* 95  --  88  BUN <5* <5* <5*  --  <5*  CREATININE 0.71 0.74 0.53*  --  0.58*  CALCIUM 8.8* 8.9 8.0*  --  8.3*  MG  --   --   --  1.4*  --    GFR: Estimated Creatinine Clearance: 93 mL/min (A) (by C-G formula based on SCr of 0.58 mg/dL (L)). Liver Function Tests:  Recent Labs Lab 01/11/17 0833 01/15/17 0546  AST 51* 36  ALT 33 22  ALKPHOS 97 107  BILITOT 0.7 1.0  PROT 7.9 8.2*  ALBUMIN 4.0 4.1    Recent Labs Lab 01/11/17 0833 01/15/17 0546 01/16/17 0424  LIPASE 143* 184* 126*   No results for input(s): AMMONIA in the last 168 hours. Coagulation Profile: No results for input(s): INR, PROTIME in the last 168 hours. Cardiac Enzymes: No results for input(s): CKTOTAL, CKMB, CKMBINDEX, TROPONINI in the last 168 hours. BNP (last 3  results) No results for input(s): PROBNP in the last 8760 hours. HbA1C: No results for input(s): HGBA1C in the last 72 hours. CBG: No results for input(s): GLUCAP in the last 168 hours. Lipid Profile: No results for input(s): CHOL, HDL, LDLCALC, TRIG, CHOLHDL, LDLDIRECT in the last 72 hours. Thyroid Function Tests: No results for input(s): TSH, T4TOTAL, FREET4, T3FREE, THYROIDAB in the last 72 hours. Anemia Panel: No results for input(s): VITAMINB12, FOLATE, FERRITIN, TIBC, IRON, RETICCTPCT in the last 72 hours. Sepsis Labs:  No results for input(s): PROCALCITON, LATICACIDVEN in the last 168 hours.  No results found for this or any previous visit (from the past 240 hour(s)).       Radiology Studies: No results found.      Scheduled Meds: . enoxaparin (LOVENOX) injection  40 mg Subcutaneous Q24H  . folic acid  1 mg Oral Daily  . gabapentin  300 mg Oral TID  . haloperidol  10 mg Oral QHS  . hydrALAZINE  25 mg Oral Q8H  . lipase/protease/amylase  24,000 Units Oral TID WC  . loratadine  10 mg Oral Daily  . LORazepam  0-4 mg Oral Q6H   Followed by  . LORazepam  0-4 mg Oral Q12H  . metoprolol tartrate  25 mg Oral BID  . multivitamin with minerals  1 tablet Oral Daily  . nicotine  21 mg Transdermal Daily  . oxyCODONE-acetaminophen  1 tablet Oral Once  . sertraline  75 mg Oral Daily  . thiamine  100 mg Oral Daily   Or  . thiamine  100 mg Intravenous Daily   Continuous Infusions: . lactated ringers 1 mL (01/17/17 0907)     LOS: 2 days    Time spent: 35 min    Fayrene Helper, MD [facility] [phone]   If 7PM-7AM, please contact night-coverage [email or web address] Password [facility] 01/17/2017, 12:59 PM

## 2017-01-18 DIAGNOSIS — K859 Acute pancreatitis without necrosis or infection, unspecified: Secondary | ICD-10-CM

## 2017-01-18 DIAGNOSIS — K861 Other chronic pancreatitis: Secondary | ICD-10-CM

## 2017-01-18 LAB — BASIC METABOLIC PANEL
ANION GAP: 5 (ref 5–15)
BUN: <5 mg/dL — ABNORMAL LOW (ref 6–20)
CALCIUM: 8.5 mg/dL — AB (ref 8.9–10.3)
CHLORIDE: 106 mmol/L (ref 101–111)
CO2: 28 mmol/L (ref 22–32)
Creatinine, Ser: 0.68 mg/dL (ref 0.61–1.24)
GFR calc non Af Amer: >60 mL/min (ref >60)
Glucose, Bld: 99 mg/dL (ref 65–99)
POTASSIUM: 3.4 mmol/L — AB (ref 3.5–5.1)
Sodium: 139 mmol/L (ref 135–145)

## 2017-01-18 LAB — CBC WITH DIFFERENTIAL/PLATELET
BASOS ABS: 0 10*3/uL (ref 0.0–0.1)
BASOS PCT: 0 %
Eosinophils Absolute: 0.6 10*3/uL (ref 0.0–0.7)
Eosinophils Relative: 8 %
HEMATOCRIT: 30.2 % — AB (ref 39.0–52.0)
HEMOGLOBIN: 9.4 g/dL — AB (ref 13.0–17.0)
LYMPHS PCT: 39 %
Lymphs Abs: 3 10*3/uL (ref 0.7–4.0)
MCH: 26.9 pg (ref 26.0–34.0)
MCHC: 31.1 g/dL (ref 30.0–36.0)
MCV: 86.5 fL (ref 78.0–100.0)
MONO ABS: 0.7 10*3/uL (ref 0.1–1.0)
MONOS PCT: 9 %
NEUTROS ABS: 3.4 10*3/uL (ref 1.7–7.7)
Neutrophils Relative %: 44 %
Platelets: 153 10*3/uL (ref 150–400)
RBC: 3.49 MIL/uL — AB (ref 4.22–5.81)
RDW: 18.7 % — AB (ref 11.5–15.5)
WBC: 7.7 10*3/uL (ref 4.0–10.5)

## 2017-01-18 LAB — LIPASE, BLOOD: Lipase: 118 U/L — ABNORMAL HIGH (ref 11–51)

## 2017-01-18 NOTE — Progress Notes (Signed)
PROGRESS NOTE    [name]  YHC:[id number] DOB: March 06, 1971 DOA: 01/15/2017 PCP: Marliss Coots, NP    Brief Narrative: 46 YO male with chronic pancreatitis,pseudocyst admitted with abdominal pain with mildly elevated lipase.patient has history of polysubstance abuse.he is walking all over the hall with iv pole asking for more pain meds.he was tried on clear liquids.he says he vomitted.    Assessment & Plan:   Principal Problem:   Acute on chronic pancreatitis (HCC) Active Problems:   [name] White pattern seen on electrocardiogram   Benign essential HTN   Polysubstance abuse (tobacco, cocaine, THC, and ETOH)   Pseudocyst of pancreas  Chronic abdominal pain increase diet as tolerated.plan dc home tomorrow.fu labs today.  Polysubstance abuse /alcohol abuse no signs of withdrawal.    DVT prophylaxis: Code Status: Family Communication:  Disposition Plan:   ConsultANTS  Procedures:  Antimicrobials:    Subjective:   Objective: Vitals:   01/17/17 2154 01/17/17 2155 01/18/17 0542 01/18/17 1336  BP: (!) 176/105 (!) 176/105 (!) 158/98 (!) 144/103  Pulse: 94  90 62  Resp:    18  Temp:    97.8 F (36.6 C)  TempSrc:    Oral  SpO2:    100%  Weight:      Height:        Intake/Output Summary (Last 24 hours) at 01/18/17 1540 Last data filed at 01/18/17 1219  Gross per 24 hour  Intake          3825.84 ml  Output             4275 ml  Net          -449.16 ml   Filed Weights   01/15/17 0540 01/15/17 1647  Weight: 58.1 kg (128 lb) 57.6 kg (126 lb 14.4 oz)    Examination:  General exam: Appears calm and comfortable  Respiratory system: Clear to auscultation. Respiratory effort normal. Cardiovascular system: S1 & S2 heard, RRR. No JVD, murmurs, rubs, gallops or clicks. No pedal edema. Gastrointestinal system: Abdomen is nondistended, soft and nontender. No organomegaly or masses felt. Normal bowel sounds heard. Central nervous system: Alert and  oriented. No focal neurological deficits. Extremities: Symmetric 5 x 5 power. Skin: No rashes, lesions or ulcers Psychiatry: Judgement and insight appear normal. Mood & affect appropriate.     Data Reviewed: I have personally reviewed following labs and imaging studies  CBC:  Recent Labs Lab 01/15/17 0546 01/17/17 0309  WBC 13.7* 9.6  HGB 11.7* 9.7*  HCT 34.6* 30.6*  MCV 82.4 85.0  PLT 218 517   Basic Metabolic Panel:  Recent Labs Lab 01/15/17 0546 01/16/17 0424 01/16/17 1010 01/17/17 0309  NA 136 139  --  138  K 3.6 3.2*  --  3.8  CL 101 110  --  108  CO2 22 22  --  26  GLUCOSE 118* 95  --  88  BUN <5* <5*  --  <5*  CREATININE 0.74 0.53*  --  0.58*  CALCIUM 8.9 8.0*  --  8.3*  MG  --   --  1.4* 2.3   GFR: Estimated Creatinine Clearance: 93 mL/min (A) (by C-G formula based on SCr of 0.58 mg/dL (L)). Liver Function Tests:  Recent Labs Lab 01/15/17 0546  AST 36  ALT 22  ALKPHOS 107  BILITOT 1.0  PROT 8.2*  ALBUMIN 4.1    Recent Labs Lab 01/15/17 0546 01/16/17 0424  LIPASE 184* 126*   No results for  input(s): AMMONIA in the last 168 hours. Coagulation Profile: No results for input(s): INR, PROTIME in the last 168 hours. Cardiac Enzymes: No results for input(s): CKTOTAL, CKMB, CKMBINDEX, TROPONINI in the last 168 hours. BNP (last 3 results) No results for input(s): PROBNP in the last 8760 hours. HbA1C: No results for input(s): HGBA1C in the last 72 hours. CBG: No results for input(s): GLUCAP in the last 168 hours. Lipid Profile: No results for input(s): CHOL, HDL, LDLCALC, TRIG, CHOLHDL, LDLDIRECT in the last 72 hours. Thyroid Function Tests: No results for input(s): TSH, T4TOTAL, FREET4, T3FREE, THYROIDAB in the last 72 hours. Anemia Panel: No results for input(s): VITAMINB12, FOLATE, FERRITIN, TIBC, IRON, RETICCTPCT in the last 72 hours. Sepsis Labs: No results for input(s): PROCALCITON, LATICACIDVEN in the last 168 hours.  No results  found for this or any previous visit (from the past 240 hour(s)).       Radiology Studies: No results found.      Scheduled Meds: . folic acid  1 mg Oral Daily  . gabapentin  300 mg Oral TID  . haloperidol  10 mg Oral QHS  . hydrALAZINE  25 mg Oral Q8H  . lipase/protease/amylase  24,000 Units Oral TID WC  . loratadine  10 mg Oral Daily  . metoprolol tartrate  25 mg Oral BID  . multivitamin with minerals  1 tablet Oral Daily  . nicotine  21 mg Transdermal Daily  . oxyCODONE-acetaminophen  1 tablet Oral Once  . sertraline  75 mg Oral Daily  . thiamine  100 mg Oral Daily   Or  . thiamine  100 mg Intravenous Daily   Continuous Infusions: . lactated ringers 125 mL/hr at 01/18/17 1452     LOS: 3 days        Georgette Shell, MD [facility]   If 7PM-7AM, please contact night-coverage [email or web address] Password [facility] 01/18/2017, 3:40 PM

## 2017-01-19 ENCOUNTER — Encounter: Payer: Self-pay | Admitting: Internal Medicine

## 2017-01-19 ENCOUNTER — Encounter (HOSPITAL_COMMUNITY): Payer: Self-pay | Admitting: *Deleted

## 2017-01-19 LAB — LIPASE, BLOOD: Lipase: 65 U/L — ABNORMAL HIGH (ref 11–51)

## 2017-01-19 MED ORDER — ADULT MULTIVITAMIN W/MINERALS CH: 1.0000 | ORAL_TABLET | Freq: Every day | ORAL | 0 refills | Status: DC

## 2017-01-19 MED ORDER — NICOTINE 21 MG/24HR TD PT24: 21.0000 mg | MEDICATED_PATCH | Freq: Every day | TRANSDERMAL | 0 refills | Status: DC

## 2017-01-19 MED ORDER — OXYCODONE HCL 10 MG PO TABS: 10.0000 mg | ORAL_TABLET | ORAL | 0 refills | Status: DC | PRN

## 2017-01-19 MED ORDER — ACETAMINOPHEN 325 MG PO TABS: 650.0000 mg | ORAL_TABLET | Freq: Four times a day (QID) | ORAL | 0 refills | Status: DC | PRN

## 2017-01-19 MED ORDER — LOPERAMIDE HCL 2 MG PO CAPS: 2.0000 mg | ORAL_CAPSULE | ORAL | 0 refills | Status: DC | PRN

## 2017-01-19 MED ORDER — FOLIC ACID 1 MG PO TABS: 1.0000 mg | ORAL_TABLET | Freq: Every day | ORAL | 0 refills | Status: DC

## 2017-01-19 MED ORDER — LOPERAMIDE HCL 2 MG PO CAPS
2.0000 mg | ORAL_CAPSULE | ORAL | Status: DC | PRN
  Administered 2017-01-19: 2 mg via ORAL
  Filled 2017-01-19: qty 1

## 2017-01-19 NOTE — Plan of Care (Signed)
Dr. Hulen Skains to inform of x4 loose (not liquid) BMs this shift.  Requested Imodium order.

## 2017-01-19 NOTE — Plan of Care (Deleted)
This letter is to inform that Mr [name] was admitted to [facility] on 01/15/2017 and is being discharged on 01/19/2017.He was admitted with Pancreatitis.

## 2017-01-19 NOTE — Discharge Planning (Signed)
Patient IV removed.  Discharge papers given, explained and educated.  RN assessment and VS revealed stability for DC to home.  Informed of suggested FU appt and appt made. Scripts given and signed.  Pain med and Imodium given just before DC.  Wk note also given to patient.  Once ready, and ride arrives - patient will be walked to front and family transporting home via car.

## 2017-01-19 NOTE — Discharge Summary (Signed)
Physician Discharge Summary  [name] NIO:[id number] DOB: [birth year] DOA: 01/15/2017  PCP: Marliss Coots, NP  Admit date: 01/15/2017 Discharge date: 01/19/2017  Admitted From: Disposition:    Recommendations for Outpatient Follow-up:  1. Follow up with PCP in 1-2 weeks 2. Please obtain BMP/CBC in one week   Home Health:none Equipment/Devices:none  Discharge Condition:stable CODE STATUS:full Diet recommendation no fatty fried foods.  Brief/Interim Summary:46 YO male with chronic pancreatitis,pseudocyst admitted with abdominal pain with mildly elevated lipase.patient has history of polysubstance abuse.he is walking all over the hall with iv pole asking for more pain meds.he tolerated regular diet today.   Discharge Diagnoses:  Principal Problem:   Acute on chronic pancreatitis ([place]) Active Problems:   [name] White pattern seen on electrocardiogram   Benign essential HTN   Polysubstance abuse (tobacco, cocaine, THC, and ETOH)   Pseudocyst of pancreas  Acute on chronic pancreatitis resolved.tolerating diet.dc home today.  Discharge Instructions Follow up with pcp.avoid alcohol,fried fatty foods.  Allergies as of 01/19/2017      Reactions   Shellfish-derived Products Nausea And Vomiting   Trazodone And Nefazodone Other (See Comments)   Reaction:  Muscle spasms   Robaxin [methocarbamol] Other (See Comments)   "jumpy limbs"   Contrast Media [iodinated Diagnostic Agents] Hives   Reglan [metoclopramide] Other (See Comments)   Reaction:  Muscle spasms      Medication List    STOP taking these medications   famotidine 20 MG tablet Commonly known as:  PEPCID   haloperidol 10 MG tablet Commonly known as:  HALDOL   hydrocerin Crea   ibuprofen 400 MG tablet Commonly known as:  ADVIL,MOTRIN   ondansetron 4 MG disintegrating tablet Commonly known as:  ZOFRAN ODT     TAKE these medications   acetaminophen 325 MG tablet Commonly known as:   TYLENOL Take 2 tablets (650 mg total) by mouth every 6 (six) hours as needed for mild pain (or Fever >/= 101).   albuterol 108 (90 Base) MCG/ACT inhaler Commonly known as:  PROVENTIL HFA;VENTOLIN HFA Inhale 2 puffs into the lungs every 6 (six) hours as needed for wheezing or shortness of breath.   cholestyramine 4 g packet Commonly known as:  QUESTRAN Take 1 packet (4 g total) by mouth 3 (three) times daily as needed (diarrhea).   fluticasone 50 MCG/ACT nasal spray Commonly known as:  FLONASE Place 1 spray into both nostrils daily as needed for rhinitis.   folic acid 1 MG tablet Commonly known as:  FOLVITE Take 1 tablet (1 mg total) by mouth daily.   gabapentin 300 MG capsule Commonly known as:  NEURONTIN Take 1 capsule (300 mg total) by mouth 3 (three) times daily. For agitation What changed:  when to take this  reasons to take this  additional instructions   gi cocktail Susp suspension Take 30 mLs by mouth 2 (two) times daily as needed for indigestion. Shake well.   hydrALAZINE 25 MG tablet Commonly known as:  APRESOLINE Take 1 tablet (25 mg total) by mouth every 8 (eight) hours. What changed:  when to take this   lipase/protease/amylase 12000 units Cpep capsule Commonly known as:  CREON Take 2 capsules (24,000 Units total) by mouth 3 (three) times daily with meals. For pancreatitis   loperamide 2 MG capsule Commonly known as:  IMODIUM Take 1 capsule (2 mg total) by mouth as needed for diarrhea or loose stools.   loratadine 10 MG tablet Commonly known as:  CLARITIN Take 1 tablet (  10 mg total) by mouth daily. (May purchase from over the counter): For allergies   metoprolol tartrate 25 MG tablet Commonly known as:  LOPRESSOR Take 1 tablet (25 mg total) by mouth 2 (two) times daily. For high blood pressure   multivitamin with minerals Tabs tablet Take 1 tablet by mouth daily.   nicotine 21 mg/24hr patch Commonly known as:  NICODERM CQ - dosed in mg/24  hours Place 1 patch (21 mg total) onto the skin daily.   oxyCODONE 5 MG immediate release tablet Commonly known as:  ROXICODONE Take 1 tablet (5 mg total) by mouth every 8 (eight) hours as needed for severe pain. What changed:  Another medication with the same name was added. Make sure you understand how and when to take each.   Oxycodone HCl 10 MG Tabs Take 1 tablet (10 mg total) by mouth every 4 (four) hours as needed for moderate pain. What changed:  You were already taking a medication with the same name, and this prescription was added. Make sure you understand how and when to take each.   pantoprazole 40 MG tablet Commonly known as:  PROTONIX Take 1 tablet (40 mg total) by mouth daily.   sertraline 25 MG tablet Commonly known as:  ZOLOFT Take 3 tablets (75 mg total) by mouth daily. For depression   thiamine 100 MG tablet Take 1 tablet (100 mg total) by mouth daily.            Discharge Care Instructions        Start     Ordered   13/08/65 7846  folic acid (FOLVITE) 1 MG tablet  Daily     01/19/17 1405   01/20/17 0000  nicotine (NICODERM CQ - DOSED IN MG/24 HOURS) 21 mg/24hr patch  Daily     01/19/17 1405   01/20/17 0000  Multiple Vitamin (MULTIVITAMIN WITH MINERALS) TABS tablet  Daily     01/19/17 1405   01/19/17 0000  acetaminophen (TYLENOL) 325 MG tablet  Every 6 hours PRN     01/19/17 1405   01/19/17 0000  loperamide (IMODIUM) 2 MG capsule  As needed     01/19/17 1405   01/19/17 0000  oxyCODONE 10 MG TABS  Every 4 hours PRN     01/19/17 1405      Allergies  Allergen Reactions  . Shellfish-Derived Products Nausea And Vomiting  . Trazodone And Nefazodone Other (See Comments)    Reaction:  Muscle spasms  . Robaxin [Methocarbamol] Other (See Comments)    "jumpy limbs"  . Contrast Media [Iodinated Diagnostic Agents] Hives  . Reglan [Metoclopramide] Other (See Comments)    Reaction:  Muscle spasms    Consultations:   Procedures/Studies: Ct Abdomen  Pelvis Wo Contrast  Result Date: 01/15/2017 CLINICAL DATA:  Abdominal pain with nausea, vomiting and diarrhea. EXAM: CT ABDOMEN AND PELVIS WITHOUT CONTRAST TECHNIQUE: Multidetector CT imaging of the abdomen and pelvis was performed following the standard protocol without IV contrast. COMPARISON:  CT abdomen dated 12/26/2016. FINDINGS: Lower chest: No acute abnormality. Hepatobiliary: No focal liver abnormality is seen. No gallstones, gallbladder wall thickening, or biliary dilatation. Pancreas: Calcifications again noted within the pancreatic head indicating sequela of chronic pancreatitis. Cystic mass within the pancreatic body measures 2.5 cm greatest dimension, not significantly changed compared to the most recent CT of 12/26/2016, slightly enlarged compared the earlier CT of 07/16/2016, presumed chronic pseudocyst. Ill-defined fluid/edema about the pancreatic tail extending to the left upper quadrant, similar to appearance on recent  CT. Spleen: Normal in size without focal abnormality. Adrenals/Urinary Tract: Adrenal glands are unremarkable. Kidneys are normal, without renal calculi, focal lesion, or hydronephrosis. Bladder is unremarkable. Stomach/Bowel: Bowel is normal in caliber. No evidence of appendicitis. Walls of the stomach body again appear somewhat thickened, but possibly artifactual related to inadequate distention. Vascular/Lymphatic: Aortic atherosclerosis. No enlarged lymph nodes appreciated. Reproductive: Prostate is unremarkable. Other: Small amount of free fluid in the lower pelvis. No free intraperitoneal air seen. Musculoskeletal: Mild degenerative change at the lumbosacral junction. No acute or suspicious osseous finding. IMPRESSION: 1. Ill-defined fluid/edema about the pancreatic tail, suggesting acute on chronic pancreatitis, similar to the appearance on recent CT abdomen of 12/26/2016. Cystic appearing lesion within the pancreatic body is not significantly changed in size or  configuration, measuring 2.5 cm greatest dimension, most likely chronic pseudocyst as also suggested on earlier reports. 2. Walls of the stomach body and fundus appear thickened, similar to appearance on earlier CT, but difficult to characterize due to the inadequate stomach distension. Reactive thickening is a possibility given the adjacent pancreatitis. 3. No bowel obstruction. 4. Aortic atherosclerosis. Electronically Signed   By: Franki Cabot M.D.   On: 01/15/2017 09:28   Ct Abdomen Pelvis Wo Contrast  Result Date: 12/26/2016 CLINICAL DATA:  Pancreatitis EXAM: CT ABDOMEN AND PELVIS WITHOUT CONTRAST TECHNIQUE: Multidetector CT imaging of the abdomen and pelvis was performed following the standard protocol without IV contrast. COMPARISON:  07/16/2016 and previous FINDINGS: Lower chest: No acute abnormality. Hepatobiliary: No focal liver abnormality is seen. No gallstones, gallbladder wall thickening, or biliary dilatation. Pancreas: 2.8 cm low-attenuation/cystic process in the mid pancreatic body, previously 2.1 cm. Smaller low-attenuation foci in the region of the pancreatic tail which appears enlarged compared to the prior study with somewhat indistinct margins suggesting some adjacent retroperitoneal inflammatory/ edematous change. Scattered parenchymal calcifications in the pancreatic neck and head as before. No new pseudocyst. Spleen: Normal in size without focal abnormality. Adrenals/Urinary Tract: Adrenal glands are unremarkable. Kidneys are normal, without renal calculi, focal lesion, or hydronephrosis. Bladder is unremarkable. Stomach/Bowel: Stomach is nondistended with possible wall thickening in the mid body. Small bowel decompressed. Appendix appears normal. Colon nondilated, with No evidence of bowel wall thickening, distention, or inflammatory changes. Vascular/Lymphatic: Mild scattered aortic and iliofemoral arterial calcifications without aneurysm. Reproductive: Prostate is unremarkable.  Other: No ascites.  No free air. Musculoskeletal: Degenerative disc disease L5-S1. Negative for fracture or worrisome bone lesion. IMPRESSION: 1. Chronic pancreatitis with pseudocysts, with probable superimposed acute inflammatory component around the pancreatic tail. Electronically Signed   By: Lucrezia Europe M.D.   On: 12/26/2016 14:21      Subjective:   Discharge Exam: Vitals:   01/19/17 0637 01/19/17 1332  BP: 132/85 138/74  Pulse: 96   Resp: 19   Temp: 98 F (36.7 C)   SpO2: 98%    Vitals:   01/18/17 1336 01/18/17 2103 01/19/17 0637 01/19/17 1332  BP: (!) 144/103 (!) 154/94 132/85 138/74  Pulse: 62 96 96   Resp: 18 18 19    Temp: 97.8 F (36.6 C) 98.2 F (36.8 C) 98 F (36.7 C)   TempSrc: Oral Oral Oral   SpO2: 100% 98% 98%   Weight:      Height:        General: Pt is alert, awake, not in acute distress Cardiovascular: RRR, S1/S2 +, no rubs, no gallops Respiratory: CTA bilaterally, no wheezing, no rhonchi Abdominal: Soft, NT, ND, bowel sounds + Extremities: no edema, no cyanosis  The results of significant diagnostics from this hospitalization (including imaging, microbiology, ancillary and laboratory) are listed below for reference.     Microbiology: No results found for this or any previous visit (from the past 240 hour(s)).   Labs: BNP (last 3 results)  Recent Labs  07/03/16 1314  BNP 48.1   Basic Metabolic Panel:  Recent Labs Lab 01/15/17 0546 01/16/17 0424 01/16/17 1010 01/17/17 0309 01/18/17 1548  NA 136 139  --  138 139  K 3.6 3.2*  --  3.8 3.4*  CL 101 110  --  108 106  CO2 22 22  --  26 28  GLUCOSE 118* 95  --  88 99  BUN <5* <5*  --  <5* <5*  CREATININE 0.74 0.53*  --  0.58* 0.68  CALCIUM 8.9 8.0*  --  8.3* 8.5*  MG  --   --  1.4* 2.3  --    Liver Function Tests:  Recent Labs Lab 01/15/17 0546  AST 36  ALT 22  ALKPHOS 107  BILITOT 1.0  PROT 8.2*  ALBUMIN 4.1    Recent Labs Lab 01/15/17 0546 01/16/17 0424  01/18/17 1548 01/19/17 0735  LIPASE 184* 126* 118* 65*   No results for input(s): AMMONIA in the last 168 hours. CBC:  Recent Labs Lab 01/15/17 0546 01/17/17 0309 01/18/17 1548  WBC 13.7* 9.6 7.7  NEUTROABS  --   --  3.4  HGB 11.7* 9.7* 9.4*  HCT 34.6* 30.6* 30.2*  MCV 82.4 85.0 86.5  PLT 218 172 153   Cardiac Enzymes: No results for input(s): CKTOTAL, CKMB, CKMBINDEX, TROPONINI in the last 168 hours. BNP: Invalid input(s): POCBNP CBG: No results for input(s): GLUCAP in the last 168 hours. D-Dimer No results for input(s): DDIMER in the last 72 hours. Hgb A1c No results for input(s): HGBA1C in the last 72 hours. Lipid Profile No results for input(s): CHOL, HDL, LDLCALC, TRIG, CHOLHDL, LDLDIRECT in the last 72 hours. Thyroid function studies No results for input(s): TSH, T4TOTAL, T3FREE, THYROIDAB in the last 72 hours.  Invalid input(s): FREET3 Anemia work up No results for input(s): VITAMINB12, FOLATE, FERRITIN, TIBC, IRON, RETICCTPCT in the last 72 hours. Urinalysis    Component Value Date/Time   COLORURINE AMBER (A) 01/15/2017 0547   APPEARANCEUR HAZY (A) 01/15/2017 0547   LABSPEC 1.025 01/15/2017 0547   PHURINE 5.0 01/15/2017 0547   GLUCOSEU NEGATIVE 01/15/2017 0547   HGBUR NEGATIVE 01/15/2017 0547   HGBUR negative 04/30/2010 1020   BILIRUBINUR NEGATIVE 01/15/2017 0547   KETONESUR NEGATIVE 01/15/2017 0547   PROTEINUR 100 (A) 01/15/2017 0547   UROBILINOGEN 0.2 03/15/2015 0508   NITRITE NEGATIVE 01/15/2017 0547   LEUKOCYTESUR NEGATIVE 01/15/2017 0547   Sepsis Labs Invalid input(s): PROCALCITONIN,  WBC,  LACTICIDVEN Microbiology No results found for this or any previous visit (from the past 240 hour(s)).   Time coordinating discharge: Over 30 minutes  SIGNED:   Georgette Shell, MD  [facility] 01/19/2017, 2:06 PM   If 7PM-7AM, please contact night-coverage [email or web address] Password [email or web address]

## 2017-01-24 ENCOUNTER — Encounter (HOSPITAL_COMMUNITY): Payer: Self-pay | Admitting: Emergency Medicine

## 2017-01-24 DIAGNOSIS — R197 Diarrhea, unspecified: Secondary | ICD-10-CM | POA: Insufficient documentation

## 2017-01-24 DIAGNOSIS — I10 Essential (primary) hypertension: Secondary | ICD-10-CM | POA: Insufficient documentation

## 2017-01-24 DIAGNOSIS — J45909 Unspecified asthma, uncomplicated: Secondary | ICD-10-CM | POA: Insufficient documentation

## 2017-01-24 DIAGNOSIS — R1010 Upper abdominal pain, unspecified: Secondary | ICD-10-CM | POA: Insufficient documentation

## 2017-01-24 DIAGNOSIS — Z79899 Other long term (current) drug therapy: Secondary | ICD-10-CM | POA: Insufficient documentation

## 2017-01-24 DIAGNOSIS — R112 Nausea with vomiting, unspecified: Secondary | ICD-10-CM | POA: Insufficient documentation

## 2017-01-24 DIAGNOSIS — F17210 Nicotine dependence, cigarettes, uncomplicated: Secondary | ICD-10-CM | POA: Insufficient documentation

## 2017-01-24 LAB — CBC
HEMATOCRIT: 33.4 % — AB (ref 39.0–52.0)
Hemoglobin: 11.2 g/dL — ABNORMAL LOW (ref 13.0–17.0)
MCH: 28.1 pg (ref 26.0–34.0)
MCHC: 33.5 g/dL (ref 30.0–36.0)
MCV: 83.7 fL (ref 78.0–100.0)
Platelets: 296 10*3/uL (ref 150–400)
RBC: 3.99 MIL/uL — AB (ref 4.22–5.81)
RDW: 19 % — ABNORMAL HIGH (ref 11.5–15.5)
WBC: 10.8 10*3/uL — AB (ref 4.0–10.5)

## 2017-01-24 LAB — COMPREHENSIVE METABOLIC PANEL
ALT: 18 U/L (ref 17–63)
AST: 30 U/L (ref 15–41)
Albumin: 4.3 g/dL (ref 3.5–5.0)
Alkaline Phosphatase: 113 U/L (ref 38–126)
Anion gap: 10 (ref 5–15)
BUN: 7 mg/dL (ref 6–20)
CHLORIDE: 102 mmol/L (ref 101–111)
CO2: 25 mmol/L (ref 22–32)
Calcium: 9.2 mg/dL (ref 8.9–10.3)
Creatinine, Ser: 0.7 mg/dL (ref 0.61–1.24)
Glucose, Bld: 125 mg/dL — ABNORMAL HIGH (ref 65–99)
POTASSIUM: 3.7 mmol/L (ref 3.5–5.1)
SODIUM: 137 mmol/L (ref 135–145)
Total Bilirubin: 0.3 mg/dL (ref 0.3–1.2)
Total Protein: 8.5 g/dL — ABNORMAL HIGH (ref 6.5–8.1)

## 2017-01-24 NOTE — ED Triage Notes (Signed)
Pt BIB EMS.  Pt co 10/10 abd pain.  Was hospitalized 3 weeks ago.  States that it feels the same as it did before.

## 2017-01-25 ENCOUNTER — Emergency Department (HOSPITAL_COMMUNITY): Payer: Self-pay

## 2017-01-25 ENCOUNTER — Emergency Department (HOSPITAL_COMMUNITY)
Admission: EM | Admit: 2017-01-25 | Discharge: 2017-01-25 | Disposition: A | Discharge status: Home / Self Care | Payer: Self-pay | Attending: Emergency Medicine | Admitting: Emergency Medicine

## 2017-01-25 DIAGNOSIS — R1010 Upper abdominal pain, unspecified: Secondary | ICD-10-CM

## 2017-01-25 LAB — URINALYSIS, ROUTINE W REFLEX MICROSCOPIC
Bilirubin Urine: NEGATIVE
GLUCOSE, UA: NEGATIVE mg/dL
HGB URINE DIPSTICK: NEGATIVE
Ketones, ur: NEGATIVE mg/dL
LEUKOCYTES UA: NEGATIVE
Nitrite: NEGATIVE
PH: 5 (ref 5.0–8.0)
PROTEIN: NEGATIVE mg/dL
Specific Gravity, Urine: 1.015 (ref 1.005–1.030)

## 2017-01-25 LAB — LIPASE, BLOOD: Lipase: 100 U/L — ABNORMAL HIGH (ref 11–51)

## 2017-01-25 MED ORDER — ONDANSETRON HCL 4 MG/2ML IJ SOLN
4.0000 mg | Freq: Once | INTRAMUSCULAR | Status: AC
  Administered 2017-01-25: 4 mg via INTRAVENOUS
  Filled 2017-01-25: qty 2

## 2017-01-25 MED ORDER — SODIUM CHLORIDE 0.9 % IV BOLUS (SEPSIS)
1000.0000 mL | Freq: Once | INTRAVENOUS | Status: AC
  Administered 2017-01-25: 1000 mL via INTRAVENOUS

## 2017-01-25 MED ORDER — SODIUM CHLORIDE 0.9 % IV SOLN
INTRAVENOUS | Status: DC
  Administered 2017-01-25: 125 mL/h via INTRAVENOUS

## 2017-01-25 MED ORDER — HYDROMORPHONE HCL 1 MG/ML IJ SOLN
1.0000 mg | Freq: Once | INTRAMUSCULAR | Status: AC
  Administered 2017-01-25: 1 mg via INTRAVENOUS
  Filled 2017-01-25: qty 1

## 2017-01-25 MED ORDER — OXYCODONE HCL 10 MG PO TABS: 10.0000 mg | ORAL_TABLET | ORAL | 0 refills | Status: DC | PRN

## 2017-01-25 MED ORDER — GI COCKTAIL ~~LOC~~
30.0000 mL | Freq: Once | ORAL | Status: AC
  Administered 2017-01-25: 30 mL via ORAL
  Filled 2017-01-25: qty 30

## 2017-01-25 NOTE — Care Management Note (Addendum)
Case Management Note  CM noted pt's return to the ED.  He did not follow up on 01/11/17 with [facility].  Spoke with pt who states he didn't realize he missed his appointment until his mother told him the next day.  Pt states the clinic is directly across the street from his mothers home where he is currently staying.  He states he will not miss his next appointment and he will call to make that appointment himself.  Discussed that if he missed too many appointments they would not be able to take him as a pt.  Advised him to go to the [facility] for his pharmacy needs and to make a financial counseling appointment.  Placed all information on AVS for time of D/C.  Updated Dr. Lita Mains. No further CM needs noted at this time.

## 2017-01-25 NOTE — ED Provider Notes (Signed)
Signed out pending reevaluation after pain medication. Patient appears comfortable but continues to have mild diffuse abdominal pain. Discussed with hospitalist recommended abdominal ultrasound. Abdominal ultrasound without evidence of acute process. Patient is able to eat and drink without vomiting. Seen by Education officer, museum and arranged follow-up. Will discharge home with short supply of pain medication. Patient states he has Zofran at home for nausea. Patient has a follow-up appointment with either gastroenterology. Return precautions have been given.   Julianne Rice, MD 01/25/17 [phone]

## 2017-01-25 NOTE — ED Provider Notes (Signed)
TIME SEEN: 6:19 AM  CHIEF COMPLAINT: upper abdominal pain  HPI: Pt is a 46 y.o. male with history of alcohol abuse, chronic pancreatitis who presents emergency department with epigastric abdominal pain and vomiting for the past day. Symptoms started around 1 PM yesterday. Has also had some diarrhea without blood or melena. Reports he did drink alcohol over the weekend and also ate a greasy meal yesterday afternoon.  He reports that he knows that he needs to quit drinking alcohol. He states that he started drinking years ago when he was dating a married woman. He states that she had an abortion and this led to him drinking.  ROS: See HPI Constitutional: no fever  Eyes: no drainage  ENT: no runny nose   Cardiovascular:  no chest pain  Resp: no SOB  GI:  vomiting GU: no dysuria Integumentary: no rash  Allergy: no hives  Musculoskeletal: no leg swelling  Neurological: no slurred speech ROS otherwise negative  PAST MEDICAL HISTORY/PAST SURGICAL HISTORY:  Past Medical History:  Diagnosis Date  . Alcoholism /alcohol abuse ([place])   . Anemia   . Anxiety   . Arthritis    "knees; arms; elbows" (03/26/2015)  . Asthma   . Bipolar disorder ([place])   . Chronic bronchitis ([place])   . Chronic lower back pain   . Cocaine abuse   . Depression   . Family history of adverse reaction to anesthesia    "grandmother gets confused"  . Femoral condyle fracture ([place]) 03/08/2014   left medial/notes 03/09/2014  . GERD (gastroesophageal reflux disease)   . H/O hiatal hernia   . H/O suicide attempt 10/2012  . Heart murmur    "when he was little" (03/06/2013)  . High cholesterol   . History of blood transfusion 10/2012   "when I tried to commit suicide"  . History of stomach ulcers   . Hypertension   . Marijuana abuse, continuous   . Migraine    "a few times/year" (03/26/2015)  . Pancreatitis   . Pneumonia 1990's X 3  . PTSD (post-traumatic stress disorder)   . Shortness of breath    "can happen at  anytime" (03/06/2013)  . Sickle cell trait ([place])   . WPW (Wolff-Parkinson-White syndrome)    [name] 03/06/2013    MEDICATIONS:  Prior to Admission medications   Medication Sig Start Date End Date Taking? Authorizing Provider  acetaminophen (TYLENOL) 325 MG tablet Take 2 tablets (650 mg total) by mouth every 6 (six) hours as needed for mild pain (or Fever >/= 101). 01/19/17  Yes Georgette Shell, MD  albuterol (PROVENTIL HFA;VENTOLIN HFA) 108 (90 Base) MCG/ACT inhaler Inhale 2 puffs into the lungs every 6 (six) hours as needed for wheezing or shortness of breath. 07/25/16  Yes Lindell Spar I, NP  Alum & Mag Hydroxide-Simeth (GI COCKTAIL) SUSP suspension Take 30 mLs by mouth 2 (two) times daily as needed for indigestion. Shake well. 12/29/16  Yes Barton Dubois, MD  cholestyramine Lucrezia Starch) 4 g packet Take 1 packet (4 g total) by mouth 3 (three) times daily as needed (diarrhea). 07/25/16  Yes Lindell Spar I, NP  famotidine (PEPCID) 20 MG tablet Take 20 mg by mouth daily.   Yes [provider]  fluticasone (FLONASE) 50 MCG/ACT nasal spray Place 1 spray into both nostrils daily as needed for rhinitis. 07/25/16  Yes Lindell Spar I, NP  folic acid (FOLVITE) 1 MG tablet Take 1 tablet (1 mg total) by mouth daily. 01/20/17  Yes Georgette Shell,  MD  gabapentin (NEURONTIN) 300 MG capsule Take 1 capsule (300 mg total) by mouth 3 (three) times daily. For agitation Patient taking differently: Take 300 mg by mouth daily as needed. For agitation 07/25/16  Yes Lindell Spar I, NP  hydrALAZINE (APRESOLINE) 25 MG tablet Take 1 tablet (25 mg total) by mouth every 8 (eight) hours. Patient taking differently: Take 25 mg by mouth at bedtime.  12/29/16  Yes Barton Dubois, MD  lipase/protease/amylase (CREON) 12000 units CPEP capsule Take 2 capsules (24,000 Units total) by mouth 3 (three) times daily with meals. For pancreatitis 12/29/16  Yes Barton Dubois, MD  loperamide (IMODIUM) 2 MG capsule Take 1 capsule  (2 mg total) by mouth as needed for diarrhea or loose stools. 01/19/17  Yes Georgette Shell, MD  loratadine (CLARITIN) 10 MG tablet Take 1 tablet (10 mg total) by mouth daily. (May purchase from over the counter): For allergies 07/26/16  Yes Lindell Spar I, NP  metoprolol tartrate (LOPRESSOR) 25 MG tablet Take 1 tablet (25 mg total) by mouth 2 (two) times daily. For high blood pressure 12/29/16  Yes Barton Dubois, MD  Multiple Vitamin (MULTIVITAMIN WITH MINERALS) TABS tablet Take 1 tablet by mouth daily. 01/20/17  Yes Georgette Shell, MD  oxyCODONE 10 MG TABS Take 1 tablet (10 mg total) by mouth every 4 (four) hours as needed for moderate pain. 01/19/17  Yes Georgette Shell, MD  pantoprazole (PROTONIX) 40 MG tablet Take 1 tablet (40 mg total) by mouth daily. 12/29/16  Yes Barton Dubois, MD  QUEtiapine (SEROQUEL) 25 MG tablet Take 25 mg by mouth at bedtime.   Yes [provider]  sertraline (ZOLOFT) 25 MG tablet Take 3 tablets (75 mg total) by mouth daily. For depression 07/26/16  Yes Lindell Spar I, NP  thiamine 100 MG tablet Take 1 tablet (100 mg total) by mouth daily. 12/30/16  Yes Barton Dubois, MD  nicotine (NICODERM CQ - DOSED IN MG/24 HOURS) 21 mg/24hr patch Place 1 patch (21 mg total) onto the skin daily. Patient not taking: Reported on 01/25/2017 01/20/17   Georgette Shell, MD  oxyCODONE (ROXICODONE) 5 MG immediate release tablet Take 1 tablet (5 mg total) by mouth every 8 (eight) hours as needed for severe pain. Patient not taking: Reported on 01/25/2017 12/29/16 12/29/17  Barton Dubois, MD    ALLERGIES:  Allergies  Allergen Reactions  . Shellfish-Derived Products Nausea And Vomiting  . Trazodone And Nefazodone Other (See Comments)    Reaction:  Muscle spasms  . Robaxin [Methocarbamol] Other (See Comments)    "jumpy limbs"  . Contrast Media [Iodinated Diagnostic Agents] Hives  . Reglan [Metoclopramide] Other (See Comments)    Reaction:  Muscle spasms    SOCIAL  HISTORY:  Social History  Substance Use Topics  . Smoking status: Current Every Day Smoker    Packs/day: 1.00    Types: Cigarettes  . Smokeless tobacco: Never Used  . Alcohol use Yes     Comment: Last drink 01/14/17    FAMILY HISTORY: Family History  Problem Relation Age of Onset  . Hypertension Mother   . Hypertension Father   . Hypertension Other   . Coronary artery disease Other     EXAM: BP (!) 153/102 (BP Location: Right Arm)   Pulse 86   Temp 98.2 F (36.8 C)   Resp 18   SpO2 99%  CONSTITUTIONAL: Alert and oriented and responds appropriately to questions. Chronically ill-appearing, afebrile HEAD: Normocephalic EYES: Conjunctivae clear, pupils appear equal,  EOMI ENT: normal nose; moist mucous membranes NECK: Supple, no meningismus, no nuchal rigidity, no LAD  CARD: RRR; S1 and S2 appreciated; no murmurs, no clicks, no rubs, no gallops RESP: Normal chest excursion without splinting or tachypnea; breath sounds clear and equal bilaterally; no wheezes, no rhonchi, no rales, no hypoxia or respiratory distress, speaking full sentences ABD/GI: Normal bowel sounds; non-distended; soft, epigastric tenderness on exam and left upper quadrant tenderness, no rebound, no guarding, no peritoneal signs, no hepatosplenomegaly BACK:  The back appears normal and is non-tender to palpation, there is no CVA tenderness EXT: Normal ROM in all joints; non-tender to palpation; no edema; normal capillary refill; no cyanosis, no calf tenderness or swelling    SKIN: Normal color for age and race; warm; no rash NEURO: Moves all extremities equally PSYCH: The patient's mood and manner are appropriate. Grooming and personal hygiene are appropriate.  MEDICAL DECISION MAKING: Patient here with acute on chronic pancreatitis. He has an elevated lipase. Patient does appear uncomfortable but is afebrile and nontoxic. We'll give IV fluids, pain and nausea medication. He has been admitted in the past for his  pancreatitis.  ED PROGRESS: Patient reports feeling better after Zofran and Dilaudid. Was requesting second dose of Dilaudid for pain control. Will po challenge.  Signed out to Dr. Lita Mains to follow-up on patient's symptoms to see if he can be discharged versus admitted to the hospital.   I reviewed all nursing notes, vitals, pertinent previous records, EKGs, lab and urine results, imaging (as available).      Semiyah Newgent, Delice Bison, DO 01/25/17 [phone]

## 2017-01-31 ENCOUNTER — Encounter (HOSPITAL_COMMUNITY): Payer: Self-pay | Admitting: Emergency Medicine

## 2017-01-31 ENCOUNTER — Emergency Department (HOSPITAL_COMMUNITY)
Admission: EM | Admit: 2017-01-31 | Discharge: 2017-01-31 | Disposition: A | Discharge status: Home / Self Care | Payer: Self-pay | Attending: Emergency Medicine | Admitting: Emergency Medicine

## 2017-01-31 DIAGNOSIS — G8929 Other chronic pain: Secondary | ICD-10-CM | POA: Insufficient documentation

## 2017-01-31 DIAGNOSIS — R1012 Left upper quadrant pain: Secondary | ICD-10-CM | POA: Insufficient documentation

## 2017-01-31 DIAGNOSIS — F17210 Nicotine dependence, cigarettes, uncomplicated: Secondary | ICD-10-CM | POA: Insufficient documentation

## 2017-01-31 DIAGNOSIS — I10 Essential (primary) hypertension: Secondary | ICD-10-CM | POA: Insufficient documentation

## 2017-01-31 DIAGNOSIS — Z5321 Procedure and treatment not carried out due to patient leaving prior to being seen by health care provider: Secondary | ICD-10-CM | POA: Insufficient documentation

## 2017-01-31 DIAGNOSIS — Z79899 Other long term (current) drug therapy: Secondary | ICD-10-CM | POA: Insufficient documentation

## 2017-01-31 DIAGNOSIS — R1011 Right upper quadrant pain: Secondary | ICD-10-CM | POA: Insufficient documentation

## 2017-01-31 DIAGNOSIS — R109 Unspecified abdominal pain: Secondary | ICD-10-CM

## 2017-01-31 DIAGNOSIS — K858 Other acute pancreatitis without necrosis or infection: Secondary | ICD-10-CM | POA: Insufficient documentation

## 2017-01-31 DIAGNOSIS — E876 Hypokalemia: Secondary | ICD-10-CM | POA: Insufficient documentation

## 2017-01-31 DIAGNOSIS — R1013 Epigastric pain: Secondary | ICD-10-CM | POA: Insufficient documentation

## 2017-01-31 DIAGNOSIS — F1020 Alcohol dependence, uncomplicated: Secondary | ICD-10-CM | POA: Insufficient documentation

## 2017-01-31 LAB — URINALYSIS, ROUTINE W REFLEX MICROSCOPIC
Bilirubin Urine: NEGATIVE
Glucose, UA: NEGATIVE mg/dL
Hgb urine dipstick: NEGATIVE
Ketones, ur: NEGATIVE mg/dL
LEUKOCYTES UA: NEGATIVE
NITRITE: NEGATIVE
PH: 6 (ref 5.0–8.0)
Protein, ur: NEGATIVE mg/dL
SPECIFIC GRAVITY, URINE: 1.003 — AB (ref 1.005–1.030)

## 2017-01-31 LAB — COMPREHENSIVE METABOLIC PANEL
ALBUMIN: 4.2 g/dL (ref 3.5–5.0)
ALT: 25 U/L (ref 17–63)
ANION GAP: 11 (ref 5–15)
AST: 37 U/L (ref 15–41)
Alkaline Phosphatase: 106 U/L (ref 38–126)
BILIRUBIN TOTAL: 0.6 mg/dL (ref 0.3–1.2)
BUN: 5 mg/dL — ABNORMAL LOW (ref 6–20)
CHLORIDE: 101 mmol/L (ref 101–111)
CO2: 25 mmol/L (ref 22–32)
Calcium: 9.5 mg/dL (ref 8.9–10.3)
Creatinine, Ser: 0.53 mg/dL — ABNORMAL LOW (ref 0.61–1.24)
GFR calc Af Amer: >60 mL/min (ref >60)
GFR calc non Af Amer: >60 mL/min (ref >60)
GLUCOSE: 88 mg/dL (ref 65–99)
POTASSIUM: 3 mmol/L — AB (ref 3.5–5.1)
SODIUM: 137 mmol/L (ref 135–145)
TOTAL PROTEIN: 8.8 g/dL — AB (ref 6.5–8.1)

## 2017-01-31 LAB — CBC
HEMATOCRIT: 32.8 % — AB (ref 39.0–52.0)
HEMOGLOBIN: 11 g/dL — AB (ref 13.0–17.0)
MCH: 27.5 pg (ref 26.0–34.0)
MCHC: 33.5 g/dL (ref 30.0–36.0)
MCV: 82 fL (ref 78.0–100.0)
Platelets: 377 10*3/uL (ref 150–400)
RBC: 4 MIL/uL — AB (ref 4.22–5.81)
RDW: 17.9 % — ABNORMAL HIGH (ref 11.5–15.5)
WBC: 10.2 10*3/uL (ref 4.0–10.5)

## 2017-01-31 LAB — LIPASE, BLOOD: LIPASE: 94 U/L — AB (ref 11–51)

## 2017-01-31 LAB — ETHANOL: Alcohol, Ethyl (B): 5 mg/dL — ABNORMAL HIGH (ref <5)

## 2017-01-31 MED ORDER — SODIUM CHLORIDE 0.9 % IV BOLUS (SEPSIS)
1000.0000 mL | Freq: Once | INTRAVENOUS | Status: AC
  Administered 2017-01-31: 1000 mL via INTRAVENOUS

## 2017-01-31 MED ORDER — GI COCKTAIL ~~LOC~~
30.0000 mL | Freq: Once | ORAL | Status: AC
  Administered 2017-01-31: 30 mL via ORAL
  Filled 2017-01-31: qty 30

## 2017-01-31 MED ORDER — ONDANSETRON HCL 4 MG/2ML IJ SOLN: 4.0000 mg | Freq: Once | INTRAMUSCULAR | Status: DC | PRN

## 2017-01-31 MED ORDER — POTASSIUM CHLORIDE CRYS ER 20 MEQ PO TBCR
40.0000 meq | EXTENDED_RELEASE_TABLET | Freq: Once | ORAL | Status: AC
  Administered 2017-01-31: 40 meq via ORAL
  Filled 2017-01-31: qty 2

## 2017-01-31 MED ORDER — DICYCLOMINE HCL 10 MG/ML IM SOLN: 20.0000 mg | Freq: Once | INTRAMUSCULAR | Status: DC

## 2017-01-31 MED ORDER — ONDANSETRON HCL 4 MG PO TABS: 4.0000 mg | ORAL_TABLET | Freq: Three times a day (TID) | ORAL | 0 refills | Status: DC | PRN

## 2017-01-31 MED ORDER — FAMOTIDINE IN NACL 20-0.9 MG/50ML-% IV SOLN
20.0000 mg | Freq: Once | INTRAVENOUS | Status: AC
  Administered 2017-01-31: 20 mg via INTRAVENOUS
  Filled 2017-01-31: qty 50

## 2017-01-31 MED ORDER — KETOROLAC TROMETHAMINE 30 MG/ML IJ SOLN
15.0000 mg | Freq: Once | INTRAMUSCULAR | Status: AC
  Administered 2017-01-31: 15 mg via INTRAVENOUS
  Filled 2017-01-31: qty 1

## 2017-01-31 NOTE — ED Notes (Signed)
Patient asked another RN to remove his IV, she explained that he was not up for discharge and she would need to speak to his nurse Regulatory affairs officer) before doing so. Before writer could get into patients room, patient removed his own iv and left.

## 2017-01-31 NOTE — Discharge Instructions (Signed)
YOU NEED TO STOP DRINKING!!!  You know it makes your abdominal pain worse. Keep your appointment with [facility] next month.  Follow up with Ms [name] this week.

## 2017-01-31 NOTE — ED Provider Notes (Signed)
[place] DEPT Provider Note   CSN: [id number] Arrival date & time: 01/31/17  0106  Time seen 01:15 AM   History   Chief Complaint Chief Complaint  Patient presents with  . Abdominal Pain    HPI [name] is a 46 y.o. male.  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 He states he drank at least 3 days last week, the last time he drank was on Saturday night,September 22 when he drank 120 ounces of beer. He states he was arrested that night and has been in jail and was discharged tonight at 8 PM. He complains of his typical abdominal pain. Patient has frequent ED visits for the same. He has a care plan. He has a prescription bottle of oxycodone written 9/19 from the ED and Protonix. He states he has an appointment at [facility] gastroenterology next month to have endoscopy done.  PCP [name], Jason Muscat, [name]   Past Medical History:  Diagnosis Date  . Alcoholism /alcohol abuse ([place])   . Anemia   . Anxiety   . Arthritis    "knees; arms; elbows" (03/26/2015)  . Asthma   . Bipolar disorder ([place])   . Chronic bronchitis ([place])   . Chronic lower back pain   . Cocaine abuse   . Depression   . Family history of adverse reaction to anesthesia    "grandmother gets confused"  . Femoral condyle fracture ([place]) 03/08/2014   left medial/notes 03/09/2014  . GERD (gastroesophageal reflux disease)   . H/O hiatal hernia   . H/O suicide attempt 10/2012  . Heart murmur    "when he was little" (03/06/2013)  . High cholesterol   . History of blood transfusion 10/2012   "when I tried to commit suicide"  . History of stomach ulcers   . Hypertension   . Marijuana abuse, continuous   . Migraine    "a few times/year" (03/26/2015)  . Pancreatitis   . Pneumonia 1990's X 3  . PTSD (post-traumatic stress disorder)   . Shortness of breath    "can happen at anytime" (03/06/2013)  . Sickle cell trait ([place])   . WPW (Wolff-Parkinson-White syndrome)    [name]  03/06/2013    Patient Active Problem List   Diagnosis Date Noted  . Acute on chronic pancreatitis ([place]) 12/17/2016  . Chronic alcoholic pancreatitis ([place]) 12/05/2016  . Intractable nausea and vomiting 12/05/2016  . Verbally abusive behavior 12/05/2016  . Normocytic anemia 12/05/2016  . Alcohol use disorder, severe, dependence ([place]) 07/25/2016  . Cocaine use disorder, severe, dependence ([place]) 07/25/2016  . 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 Leukocytosis   . Hospital acquired PNA 05/20/2015  . Pancreatitis 05/18/2015  . Pseudocyst of pancreas 05/18/2015  . Polysubstance abuse (tobacco, cocaine, THC, and ETOH) 03/26/2015  . Alcohol-induced chronic pancreatitis ([place])   . Benign essential HTN 02/06/2014  . Alcohol-induced acute pancreatitis 11/28/2013  . Pancreatic pseudocyst/cyst 11/25/2013  . MDD (major depressive disorder), recurrent severe, without psychosis ([place]) 10/22/2013  . Severe protein-calorie malnutrition ([place]) 10/10/2013  . Suicide attempt ([place]) 10/08/2013  . 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 TOBACCO ABUSE 03/23/2007    Past Surgical History:  Procedure Laterality Date  . CARDIAC CATHETERIZATION    . EYE SURGERY Left 1990's   "result of trauma"   . FACIAL FRACTURE SURGERY Left 1990's   "result of trauma"   . FRACTURE SURGERY    . HERNIA  REPAIR    . LEFT HEART CATHETERIZATION WITH CORONARY ANGIOGRAM Right 03/07/2013   Procedure: LEFT HEART CATHETERIZATION WITH CORONARY ANGIOGRAM;  Surgeon: [name], MD;  Location: [facility] CATH LAB;  Service: Cardiovascular;  Laterality: Right;  . UMBILICAL HERNIA REPAIR         Home Medications    Prior to Admission medications   Medication Sig Start Date End Date Taking? Authorizing Provider  albuterol (PROVENTIL HFA;VENTOLIN HFA) 108 (90 Base) MCG/ACT inhaler Inhale 2 puffs into the lungs every 6 (six) hours as needed for wheezing or  shortness of breath. 07/25/16  Yes Lindell Spar I, [name]  Alum & Mag Hydroxide-Simeth (GI COCKTAIL) SUSP suspension Take 30 mLs by mouth 2 (two) times daily as needed for indigestion. Shake well. 12/29/16  Yes Barton Dubois, MD  cholestyramine Lucrezia Starch) 4 g packet Take 1 packet (4 g total) by mouth 3 (three) times daily as needed (diarrhea). 07/25/16  Yes Lindell Spar I, [name]  famotidine (PEPCID) 20 MG tablet Take 20 mg by mouth daily.   Yes [provider]  fluticasone (FLONASE) 50 MCG/ACT nasal spray Place 1 spray into both nostrils daily as needed for rhinitis. 07/25/16  Yes Lindell Spar I, [name]  folic acid (FOLVITE) 1 MG tablet Take 1 tablet (1 mg total) by mouth daily. 01/20/17  Yes Georgette Shell, MD  gabapentin (NEURONTIN) 300 MG capsule Take 1 capsule (300 mg total) by mouth 3 (three) times daily. For agitation Patient taking differently: Take 300 mg by mouth daily as needed. For agitation 07/25/16  Yes Lindell Spar I, [name]  hydrALAZINE (APRESOLINE) 25 MG tablet Take 1 tablet (25 mg total) by mouth every 8 (eight) hours. Patient taking differently: Take 25 mg by mouth at bedtime.  12/29/16  Yes Barton Dubois, MD  lipase/protease/amylase (CREON) 12000 units CPEP capsule Take 2 capsules (24,000 Units total) by mouth 3 (three) times daily with meals. For pancreatitis 12/29/16  Yes Barton Dubois, MD  loperamide (IMODIUM) 2 MG capsule Take 1 capsule (2 mg total) by mouth as needed for diarrhea or loose stools. 01/19/17  Yes Georgette Shell, MD  loratadine (CLARITIN) 10 MG tablet Take 1 tablet (10 mg total) by mouth daily. (May purchase from over the counter): For allergies Patient taking differently: Take 10 mg by mouth daily as needed for allergies. (May purchase from over the counter): For allergies 07/26/16  Yes Lindell Spar I, [name]  metoprolol tartrate (LOPRESSOR) 25 MG tablet Take 1 tablet (25 mg total) by mouth 2 (two) times daily. For high blood pressure 12/29/16  Yes Barton Dubois, MD    Multiple Vitamin (MULTIVITAMIN WITH MINERALS) TABS tablet Take 1 tablet by mouth daily. 01/20/17  Yes Georgette Shell, MD  Oxycodone HCl 10 MG TABS Take 1 tablet (10 mg total) by mouth every 4 (four) hours as needed. 01/25/17  Yes Julianne Rice, MD  pantoprazole (PROTONIX) 40 MG tablet Take 1 tablet (40 mg total) by mouth daily. 12/29/16  Yes Barton Dubois, MD  QUEtiapine (SEROQUEL) 25 MG tablet Take 25 mg by mouth at bedtime.   Yes [provider]  sertraline (ZOLOFT) 25 MG tablet Take 3 tablets (75 mg total) by mouth daily. For depression 07/26/16  Yes Lindell Spar I, [name]  thiamine 100 MG tablet Take 1 tablet (100 mg total) by mouth daily. 12/30/16  Yes Barton Dubois, MD  acetaminophen (TYLENOL) 325 MG tablet Take 2 tablets (650 mg total) by mouth every 6 (six) hours as needed for mild pain (  or Fever >/= 101). Patient not taking: Reported on 01/31/2017 01/19/17   Georgette Shell, MD  nicotine (NICODERM CQ - DOSED IN MG/24 HOURS) 21 mg/24hr patch Place 1 patch (21 mg total) onto the skin daily. Patient not taking: Reported on 01/25/2017 01/20/17   Georgette Shell, MD  ondansetron [facility]) 4 MG tablet Take 1 tablet (4 mg total) by mouth every 8 (eight) hours as needed for nausea or vomiting. 01/31/17   Rolland Porter, MD    Family History Family History  Problem Relation Age of Onset  . Hypertension Mother   . Hypertension Father   . Hypertension Other   . Coronary artery disease Other     Social History Social History  Substance Use Topics  . Smoking status: Current Every Day Smoker    Packs/day: 1.00    Types: Cigarettes  . Smokeless tobacco: Never Used  . Alcohol use Yes     Comment: Last drink 01/14/17     Allergies   Shellfish-derived products; Trazodone and nefazodone; Robaxin [methocarbamol]; Contrast media [iodinated diagnostic agents]; and Reglan [metoclopramide]   Review of Systems Review of Systems  All other systems reviewed and are  negative.    Physical Exam Updated Vital Signs BP (!) 144/111 (BP Location: Left Arm)   Pulse (!) 105   Temp 98.5 F (36.9 C) (Oral)   Resp 17   Ht 5\' 8"  (1.727 m)   Wt 63.5 kg (140 lb)   SpO2 98%   BMI 21.29 kg/m   Vital signs normal except for hypertension and tachycardia   Physical Exam  Constitutional: No distress.  HENT:  Head: Normocephalic and atraumatic.  Right Ear: External ear normal.  Left Ear: External ear normal.  Nose: Nose normal.  Mouth/Throat: Mucous membranes are dry.  Eyes: Pupils are equal, round, and reactive to light. Conjunctivae and EOM are normal.  Neck: Normal range of motion. Neck supple.  Cardiovascular: Normal rate, regular rhythm and normal heart sounds.   Pulmonary/Chest: Effort normal and breath sounds normal. No respiratory distress.  Abdominal: Soft. There is tenderness in the right upper quadrant, epigastric area and left upper quadrant.  Musculoskeletal: Normal range of motion. He exhibits no edema, tenderness or deformity.  Neurological: He is alert. No cranial nerve deficit.  Skin: Skin is warm and dry. No erythema.  Psychiatric: He has a normal mood and affect. His behavior is normal. Thought content normal.  Nursing note and vitals reviewed.    ED Treatments / Results  Labs (all labs ordered are listed, but only abnormal results are displayed) Results for orders placed or performed during the hospital encounter of 01/31/17  Lipase, blood  Result Value Ref Range   Lipase 94 (H) 11 - 51 U/L  Comprehensive metabolic panel  Result Value Ref Range   Sodium 137 135 - 145 mmol/L   Potassium 3.0 (L) 3.5 - 5.1 mmol/L   Chloride 101 101 - 111 mmol/L   CO2 25 22 - 32 mmol/L   Glucose, Bld 88 65 - 99 mg/dL   BUN 5 (L) 6 - 20 mg/dL   Creatinine, Ser 0.53 (L) 0.61 - 1.24 mg/dL   Calcium 9.5 8.9 - 10.3 mg/dL   Total Protein 8.8 (H) 6.5 - 8.1 g/dL   Albumin 4.2 3.5 - 5.0 g/dL   AST 37 15 - 41 U/L   ALT 25 17 - 63 U/L   Alkaline  Phosphatase 106 38 - 126 U/L   Total Bilirubin 0.6 0.3 - 1.2  mg/dL   GFR calc non Af Amer >60 >60 mL/min   GFR calc Af Amer >60 >60 mL/min   Anion gap 11 5 - 15  CBC  Result Value Ref Range   WBC 10.2 4.0 - 10.5 K/uL   RBC 4.00 (L) 4.22 - 5.81 MIL/uL   Hemoglobin 11.0 (L) 13.0 - 17.0 g/dL   HCT 32.8 (L) 39.0 - 52.0 %   MCV 82.0 78.0 - 100.0 fL   MCH 27.5 26.0 - 34.0 pg   MCHC 33.5 30.0 - 36.0 g/dL   RDW 17.9 (H) 11.5 - 15.5 %   Platelets 377 150 - 400 K/uL  Urinalysis, Routine w reflex microscopic  Result Value Ref Range   Color, Urine STRAW (A) YELLOW   APPearance CLEAR CLEAR   Specific Gravity, Urine 1.003 (L) 1.005 - 1.030   pH 6.0 5.0 - 8.0   Glucose, UA NEGATIVE NEGATIVE mg/dL   Hgb urine dipstick NEGATIVE NEGATIVE   Bilirubin Urine NEGATIVE NEGATIVE   Ketones, ur NEGATIVE NEGATIVE mg/dL   Protein, ur NEGATIVE NEGATIVE mg/dL   Nitrite NEGATIVE NEGATIVE   Leukocytes, UA NEGATIVE NEGATIVE  Ethanol  Result Value Ref Range   Alcohol, Ethyl (B) 5 (H) <5 mg/dL   Laboratory interpretation all normal except hypokalemia, perisistant elevation of lipase    EKG  EKG Interpretation None       Radiology No results found.   Ct Abdomen Pelvis Wo Contrast  Result Date: 01/15/2017 CLINICAL DATA:  Abdominal pain with nausea, vomiting and diarrhea. IMPRESSION: 1. Ill-defined fluid/edema about the pancreatic tail, suggesting acute on chronic pancreatitis, similar to the appearance on recent CT abdomen of 12/26/2016. Cystic appearing lesion within the pancreatic body is not significantly changed in size or configuration, measuring 2.5 cm greatest dimension, most likely chronic pseudocyst as also suggested on earlier reports. 2. Walls of the stomach body and fundus appear thickened, similar to appearance on earlier CT, but difficult to characterize due to the inadequate stomach distension. Reactive thickening is a possibility given the adjacent pancreatitis. 3. No bowel obstruction.  4. Aortic atherosclerosis. Electronically Signed   By: Franki Cabot M.D.   On: 01/15/2017 09:28   US Abdomen Complete  Result Date: 01/25/2017 CLINICAL DATA:  Acute abdominal pain.  Pancreatitis  IMPRESSION: 1. Hypoechoic structure within the pancreas likely chronic pseudocyst secondary to pancreatitis. 2. No gallstones identified. Electronically Signed   By: Kerby Moors M.D.   On: 01/25/2017 12:33    Procedures Procedures (including critical care time)  Medications Ordered in ED Medications  ondansetron (ZOFRAN) injection 4 mg (not administered)  dicyclomine (BENTYL) injection 20 mg (20 mg Intramuscular Refused 01/31/17 0202)  sodium chloride 0.9 % bolus 1,000 mL (1,000 mLs Intravenous New Bag/Given 01/31/17 0214)  sodium chloride 0.9 % bolus 1,000 mL (1,000 mLs Intravenous New Bag/Given 01/31/17 0214)  famotidine (PEPCID) IVPB 20 mg premix (0 mg Intravenous Stopped 01/31/17 0319)  gi cocktail (Maalox,Lidocaine,Donnatal) (30 mLs Oral Given 01/31/17 0213)  potassium chloride SA (K-DUR,KLOR-CON) CR tablet 40 mEq (40 mEq Oral Given 01/31/17 0317)  ketorolac (TORADOL) 30 MG/ML injection 15 mg (15 mg Intravenous Given 01/31/17 0318)     Initial Impression / Assessment and Plan / ED Course  I have reviewed the triage vital signs and the nursing notes.  Pertinent labs & imaging results that were available during my care of the patient were reviewed by me and considered in my medical decision making (see chart for details).    Patient was given  IV fluids, IV nausea medicine and IV Pepcid. I have discussed with him he's not getting any narcotic pain medication. I had ordered Bentyl for his abdominal pain however he states it is an allergy of his although it is not listed in his allergy list. He was requesting a GI cocktail which was given.  After reviewing patient's laboratory tests he was given oral potassium.  Recheck at 2:40 AM patient was given his test results. He has chronic minor  elevation of his lipase which is actually improved from his prior. His IV fluids are still running. He states he feels about the same. Patient was advised that he would not be getting any more narcotics. He will be discharged home after the IV fluids are in and he has a prescription of pain pills he can take, oxycodone. He states "they don't help". He is requesting Toradol for his anti-inflammatory effect, he was given 15 mg IV.  Final Clinical Impressions(s) / ED Diagnoses   Final diagnoses:  Chronic abdominal pain  Hypokalemia    New Prescriptions New Prescriptions   ONDANSETRON (ZOFRAN) 4 MG TABLET    Take 1 tablet (4 mg total) by mouth every 8 (eight) hours as needed for nausea or vomiting.    04:00 AM Pt left without getting his discharge instructions.   Rolland Porter, MD, Barbette Or, MD 01/31/17 [phone]

## 2017-01-31 NOTE — ED Triage Notes (Signed)
Patient BIB [facility] from home for LUQ abdominal pain x48 hours. Pt states it feels like pancreatitis. Pt reports 3-4 episodes of n/v/d in the last 24 hours. Patient reported to EMS that he was celebrating being released from jail and had one beer.

## 2017-01-31 NOTE — ED Notes (Signed)
Pt given urinal and made aware of need for urine specimen 

## 2017-01-31 NOTE — ED Notes (Signed)
Bed: WA20 Expected date:  Expected time:  Means of arrival:  Comments: 46 yo M/abd pain

## 2017-02-07 ENCOUNTER — Emergency Department (HOSPITAL_COMMUNITY)
Admission: EM | Admit: 2017-02-07 | Discharge: 2017-02-08 | Disposition: A | Discharge status: Home / Self Care | Payer: Self-pay | Attending: Emergency Medicine | Admitting: Emergency Medicine

## 2017-02-07 ENCOUNTER — Encounter (HOSPITAL_COMMUNITY): Payer: Self-pay | Admitting: Emergency Medicine

## 2017-02-07 DIAGNOSIS — E7800 Pure hypercholesterolemia, unspecified: Secondary | ICD-10-CM | POA: Insufficient documentation

## 2017-02-07 DIAGNOSIS — F17210 Nicotine dependence, cigarettes, uncomplicated: Secondary | ICD-10-CM | POA: Insufficient documentation

## 2017-02-07 DIAGNOSIS — F1010 Alcohol abuse, uncomplicated: Secondary | ICD-10-CM | POA: Insufficient documentation

## 2017-02-07 DIAGNOSIS — I10 Essential (primary) hypertension: Secondary | ICD-10-CM | POA: Insufficient documentation

## 2017-02-07 DIAGNOSIS — Z79899 Other long term (current) drug therapy: Secondary | ICD-10-CM | POA: Insufficient documentation

## 2017-02-07 DIAGNOSIS — J45909 Unspecified asthma, uncomplicated: Secondary | ICD-10-CM | POA: Insufficient documentation

## 2017-02-07 DIAGNOSIS — R1013 Epigastric pain: Secondary | ICD-10-CM | POA: Insufficient documentation

## 2017-02-07 DIAGNOSIS — F1210 Cannabis abuse, uncomplicated: Secondary | ICD-10-CM | POA: Insufficient documentation

## 2017-02-07 DIAGNOSIS — I456 Pre-excitation syndrome: Secondary | ICD-10-CM | POA: Insufficient documentation

## 2017-02-07 DIAGNOSIS — D573 Sickle-cell trait: Secondary | ICD-10-CM | POA: Insufficient documentation

## 2017-02-07 LAB — CBC WITH DIFFERENTIAL/PLATELET
BASOS ABS: 0 10*3/uL (ref 0.0–0.1)
BASOS PCT: 0 %
Eosinophils Absolute: 0.2 10*3/uL (ref 0.0–0.7)
Eosinophils Relative: 2 %
HEMATOCRIT: 28.3 % — AB (ref 39.0–52.0)
HEMOGLOBIN: 9.6 g/dL — AB (ref 13.0–17.0)
Lymphocytes Relative: 45 %
Lymphs Abs: 3.8 10*3/uL (ref 0.7–4.0)
MCH: 28 pg (ref 26.0–34.0)
MCHC: 33.9 g/dL (ref 30.0–36.0)
MCV: 82.5 fL (ref 78.0–100.0)
MONOS PCT: 6 %
Monocytes Absolute: 0.5 10*3/uL (ref 0.1–1.0)
NEUTROS ABS: 4 10*3/uL (ref 1.7–7.7)
NEUTROS PCT: 47 %
Platelets: 201 10*3/uL (ref 150–400)
RBC: 3.43 MIL/uL — ABNORMAL LOW (ref 4.22–5.81)
RDW: 19.9 % — ABNORMAL HIGH (ref 11.5–15.5)
WBC: 8.5 10*3/uL (ref 4.0–10.5)

## 2017-02-07 LAB — COMPREHENSIVE METABOLIC PANEL
ALK PHOS: 102 U/L (ref 38–126)
ALT: 21 U/L (ref 17–63)
AST: 28 U/L (ref 15–41)
Albumin: 3.8 g/dL (ref 3.5–5.0)
Anion gap: 12 (ref 5–15)
BILIRUBIN TOTAL: 0.7 mg/dL (ref 0.3–1.2)
BUN: 7 mg/dL (ref 6–20)
CO2: 21 mmol/L — ABNORMAL LOW (ref 22–32)
CREATININE: 0.67 mg/dL (ref 0.61–1.24)
Calcium: 9 mg/dL (ref 8.9–10.3)
Chloride: 102 mmol/L (ref 101–111)
Glucose, Bld: 101 mg/dL — ABNORMAL HIGH (ref 65–99)
Potassium: 3.6 mmol/L (ref 3.5–5.1)
Sodium: 135 mmol/L (ref 135–145)
TOTAL PROTEIN: 8.2 g/dL — AB (ref 6.5–8.1)

## 2017-02-07 LAB — LIPASE, BLOOD: Lipase: 100 U/L — ABNORMAL HIGH (ref 11–51)

## 2017-02-07 MED ORDER — FENTANYL CITRATE (PF) 100 MCG/2ML IJ SOLN
100.0000 ug | Freq: Once | INTRAMUSCULAR | Status: AC
  Administered 2017-02-07: 100 ug via INTRAVENOUS
  Filled 2017-02-07: qty 2

## 2017-02-07 MED ORDER — SODIUM CHLORIDE 0.9 % IV BOLUS (SEPSIS)
1000.0000 mL | Freq: Once | INTRAVENOUS | Status: AC
  Administered 2017-02-07: 1000 mL via INTRAVENOUS

## 2017-02-07 MED ORDER — ONDANSETRON HCL 4 MG/2ML IJ SOLN
4.0000 mg | Freq: Once | INTRAMUSCULAR | Status: AC
  Administered 2017-02-07: 4 mg via INTRAVENOUS
  Filled 2017-02-07: qty 2

## 2017-02-07 MED ORDER — GI COCKTAIL ~~LOC~~
30.0000 mL | Freq: Once | ORAL | Status: AC
  Administered 2017-02-07: 30 mL via ORAL
  Filled 2017-02-07: qty 30

## 2017-02-07 NOTE — Discharge Instructions (Signed)
Abdominal (belly) pain can be caused by many things. Your caregiver performed an examination and possibly ordered blood/urine tests and imaging (CT scan, x-rays, ultrasound). Many cases can be observed and treated at home after initial evaluation in the emergency department. Even though you are being discharged home, abdominal pain can be unpredictable. Therefore, you need a repeated exam if your pain does not resolve, returns, or worsens. Most patients with abdominal pain don't have to be admitted to the hospital or have surgery, but serious problems like appendicitis and gallbladder attacks can start out as nonspecific pain. Many abdominal conditions cannot be diagnosed in one visit, so follow-up evaluations are very important. °SEEK IMMEDIATE MEDICAL ATTENTION IF: °The pain does not go away or becomes severe.  °A temperature above 101 develops.  °Repeated vomiting occurs (multiple episodes).  °The pain becomes localized to portions of the abdomen. The right side could possibly be appendicitis. In an adult, the left lower portion of the abdomen could be colitis or diverticulitis.  °Blood is being passed in stools or vomit (bright red or black tarry stools).  °Return also if you develop chest pain, difficulty breathing, dizziness or fainting, or become confused, poorly responsive, or inconsolable (young children). ° °

## 2017-02-07 NOTE — ED Provider Notes (Signed)
[facility] DEPT Provider Note   CSN: [id number] Arrival date & time: 02/07/17  1759     History   Chief Complaint Chief Complaint  Patient presents with  . Abdominal Pain    HPI [name] is a 46 y.o. male with a history of chronic alcohol abuse chronic gastritis, chronic pancreatitis who presents emergency Department with chief complaint of epigastric and left upper quadrant abdominal pain. He has 18 visits to the emergency department in the past 6 months. It does not include visits to outside emergency departments. Patient states that his abdominal pain is severe. He has had some nausea. He denies any vomiting. He continues to drink alcohol daily and smokes marijuana daily. He denies hematemesis, fevers, chills.  HPI  Past Medical History:  Diagnosis Date  . Alcoholism /alcohol abuse ([place])   . Anemia   . Anxiety   . Arthritis    "knees; arms; elbows" (03/26/2015)  . Asthma   . Bipolar disorder ([place])   . Chronic bronchitis ([place])   . Chronic lower back pain   . Cocaine abuse ([place])   . Depression   . Family history of adverse reaction to anesthesia    "grandmother gets confused"  . Femoral condyle fracture ([place]) 03/08/2014   left medial/notes 03/09/2014  . GERD (gastroesophageal reflux disease)   . H/O hiatal hernia   . H/O suicide attempt 10/2012  . Heart murmur    "when he was little" (03/06/2013)  . High cholesterol   . History of blood transfusion 10/2012   "when I tried to commit suicide"  . History of stomach ulcers   . Hypertension   . Marijuana abuse, continuous   . Migraine    "a few times/year" (03/26/2015)  . Pancreatitis   . Pneumonia 1990's X 3  . PTSD (post-traumatic stress disorder)   . Shortness of breath    "can happen at anytime" (03/06/2013)  . Sickle cell trait ([place])   . WPW (Wolff-Parkinson-White syndrome)    [name] 03/06/2013    Patient Active Problem List   Diagnosis Date Noted  . Acute on chronic pancreatitis ([place])  12/17/2016  . Chronic alcoholic pancreatitis ([place]) 12/05/2016  . Intractable nausea and vomiting 12/05/2016  . Verbally abusive behavior 12/05/2016  . Normocytic anemia 12/05/2016  . Alcohol use disorder, severe, dependence ([place]) 07/25/2016  . Cocaine use disorder, severe, dependence ([place]) 07/25/2016  . Major depressive disorder, recurrent severe without psychotic features ([place]) 07/20/2016  . Leukocytosis   . Hospital acquired PNA 05/20/2015  . Pancreatitis 05/18/2015  . Pseudocyst of pancreas 05/18/2015  . Polysubstance abuse (tobacco, cocaine, THC, and ETOH) 03/26/2015  . Alcohol-induced chronic pancreatitis ([place])   . Benign essential HTN 02/06/2014  . Alcohol-induced acute pancreatitis 11/28/2013  . Pancreatic pseudocyst/cyst 11/25/2013  . MDD (major depressive disorder), recurrent severe, without psychosis ([place]) 10/22/2013  . Severe protein-calorie malnutrition ([place]) 10/10/2013  . Suicide attempt ([place]) 10/08/2013  . 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 TOBACCO ABUSE 03/23/2007    Past Surgical History:  Procedure Laterality Date  . CARDIAC CATHETERIZATION    . EYE SURGERY Left 1990's   "result of trauma"   . FACIAL FRACTURE SURGERY Left 1990's   "result of trauma"   . FRACTURE SURGERY    . HERNIA REPAIR    . LEFT HEART CATHETERIZATION WITH CORONARY ANGIOGRAM Right 03/07/2013   Procedure: LEFT HEART CATHETERIZATION WITH CORONARY ANGIOGRAM;  Surgeon: [name], MD;  Location: [facility]ATH [facility];  Service:  Cardiovascular;  Laterality: Right;  . UMBILICAL HERNIA REPAIR         Home Medications    Prior to Admission medications   Medication Sig Start Date End Date Taking? Authorizing Provider  albuterol (PROVENTIL HFA;VENTOLIN HFA) 108 (90 Base) MCG/ACT inhaler Inhale 2 puffs into the lungs every 6 (six) hours as needed for wheezing or shortness of breath. 07/25/16  Yes Lindell Spar I, NP  Alum & Mag Hydroxide-Simeth (GI COCKTAIL) SUSP  suspension Take 30 mLs by mouth 2 (two) times daily as needed for indigestion. Shake well. 12/29/16  Yes Barton Dubois, MD  cholestyramine Lucrezia Starch) 4 g packet Take 1 packet (4 g total) by mouth 3 (three) times daily as needed (diarrhea). 07/25/16  Yes Lindell Spar I, NP  famotidine (PEPCID) 20 MG tablet Take 20 mg by mouth daily.   Yes [provider]  fluticasone (FLONASE) 50 MCG/ACT nasal spray Place 1 spray into both nostrils daily as needed for rhinitis. 07/25/16  Yes Lindell Spar I, NP  folic acid (FOLVITE) 1 MG tablet Take 1 tablet (1 mg total) by mouth daily. 01/20/17  Yes Georgette Shell, MD  gabapentin (NEURONTIN) 300 MG capsule Take 1 capsule (300 mg total) by mouth 3 (three) times daily. For agitation Patient taking differently: Take 300 mg by mouth daily as needed. For agitation 07/25/16  Yes Lindell Spar I, NP  hydrALAZINE (APRESOLINE) 25 MG tablet Take 1 tablet (25 mg total) by mouth every 8 (eight) hours. Patient taking differently: Take 25 mg by mouth at bedtime.  12/29/16  Yes Barton Dubois, MD  lipase/protease/amylase (CREON) 12000 units CPEP capsule Take 2 capsules (24,000 Units total) by mouth 3 (three) times daily with meals. For pancreatitis 12/29/16  Yes Barton Dubois, MD  loperamide (IMODIUM) 2 MG capsule Take 1 capsule (2 mg total) by mouth as needed for diarrhea or loose stools. 01/19/17  Yes Georgette Shell, MD  loratadine (CLARITIN) 10 MG tablet Take 1 tablet (10 mg total) by mouth daily. (May purchase from over the counter): For allergies Patient taking differently: Take 10 mg by mouth daily as needed for allergies. (May purchase from over the counter): For allergies 07/26/16  Yes Lindell Spar I, NP  metoprolol tartrate (LOPRESSOR) 25 MG tablet Take 1 tablet (25 mg total) by mouth 2 (two) times daily. For high blood pressure 12/29/16  Yes Barton Dubois, MD  Multiple Vitamin (MULTIVITAMIN WITH MINERALS) TABS tablet Take 1 tablet by mouth daily. 01/20/17  Yes  Georgette Shell, MD  ondansetron (ZOFRAN) 4 MG tablet Take 1 tablet (4 mg total) by mouth every 8 (eight) hours as needed for nausea or vomiting. 01/31/17  Yes Rolland Porter, MD  Oxycodone HCl 10 MG TABS Take 1 tablet (10 mg total) by mouth every 4 (four) hours as needed. Patient taking differently: Take 10 mg by mouth every 4 (four) hours as needed.  01/25/17  Yes Julianne Rice, MD  pantoprazole (PROTONIX) 40 MG tablet Take 1 tablet (40 mg total) by mouth daily. 12/29/16  Yes Barton Dubois, MD  QUEtiapine (SEROQUEL) 25 MG tablet Take 25 mg by mouth at bedtime.   Yes [provider]  sertraline (ZOLOFT) 25 MG tablet Take 3 tablets (75 mg total) by mouth daily. For depression 07/26/16  Yes Lindell Spar I, NP  thiamine 100 MG tablet Take 1 tablet (100 mg total) by mouth daily. 12/30/16  Yes Barton Dubois, MD    Family History Family History  Problem Relation Age of Onset  .  Hypertension Mother   . Hypertension Father   . Hypertension Other   . Coronary artery disease Other     Social History Social History  Substance Use Topics  . Smoking status: Current Every Day Smoker    Packs/day: 1.00    Types: Cigarettes  . Smokeless tobacco: Never Used  . Alcohol use Yes     Comment: Last drink 01/14/17     Allergies   Shellfish-derived products; Trazodone and nefazodone; Robaxin [methocarbamol]; Contrast media [iodinated diagnostic agents]; and Reglan [metoclopramide]   Review of Systems Review of Systems  Ten systems reviewed and are negative for acute change, except as noted in the HPI.   Physical Exam Updated Vital Signs BP (!) 147/111 (BP Location: Left Arm)   Pulse 99   Temp 98.7 F (37.1 C) (Oral)   Resp 18   Ht 5\' 8"  (1.727 m)   Wt 63.5 kg (140 lb)   SpO2 100%   BMI 21.29 kg/m   Physical Exam  Constitutional: He appears well-developed and well-nourished. No distress.  Appears uncomfortable, clutching stomach, sitting on the edge of the bed and rocking    HENT:  Head: Normocephalic and atraumatic.  Eyes: Conjunctivae are normal. No scleral icterus.  Neck: Normal range of motion. Neck supple.  Cardiovascular: Normal rate, regular rhythm and normal heart sounds.   Pulmonary/Chest: Effort normal and breath sounds normal. No respiratory distress.  Abdominal: Soft. He exhibits no distension and no mass. There is tenderness. There is no guarding.  Musculoskeletal: He exhibits no edema.  Neurological: He is alert.  Skin: Skin is warm and dry. He is not diaphoretic.  Psychiatric: His behavior is normal.  Nursing note and vitals reviewed.    ED Treatments / Results  Labs (all labs ordered are listed, but only abnormal results are displayed) Labs Reviewed  COMPREHENSIVE METABOLIC PANEL - Abnormal; Notable for the following:       Result Value   CO2 21 (*)    Glucose, Bld 101 (*)    Total Protein 8.2 (*)    All other components within normal limits  LIPASE, BLOOD - Abnormal; Notable for the following:    Lipase 100 (*)    All other components within normal limits  CBC WITH DIFFERENTIAL/PLATELET - Abnormal; Notable for the following:    RBC 3.43 (*)    Hemoglobin 9.6 (*)    HCT 28.3 (*)    RDW 19.9 (*)    All other components within normal limits  RAPID URINE DRUG SCREEN, HOSP PERFORMED    EKG  EKG Interpretation None       Radiology No results found.  Procedures Procedures (including critical care time)  Medications Ordered in ED Medications  sodium chloride 0.9 % bolus 1,000 mL (1,000 mLs Intravenous New Bag/Given 02/07/17 2004)  gi cocktail (Maalox,Lidocaine,Donnatal) (30 mLs Oral Given 02/07/17 1946)  fentaNYL (SUBLIMAZE) injection 100 mcg (100 mcg Intravenous Given 02/07/17 2205)  ondansetron (ZOFRAN) injection 4 mg (4 mg Intravenous Given 02/07/17 2205)     Initial Impression / Assessment and Plan / ED Course  I have reviewed the triage vital signs and the nursing notes.  Pertinent labs & imaging results that were  available during my care of the patient were reviewed by me and considered in my medical decision making (see chart for details).     patient's labs appear to be at baseline. Patient will be discharged. Advised to discontinue drinking and discontinue marijuana abuse. Given outpatient resources. Appears appropriate for discharge  at this time. Final Clinical Impressions(s) / ED Diagnoses   Final diagnoses:  None    New Prescriptions New Prescriptions   No medications on file     Margarita Mail, PA-C 02/08/17 0125    [name], MD 02/09/17 [phone]

## 2017-02-07 NOTE — ED Triage Notes (Signed)
Per EMS-history of pancreatitis-complaining of abdominal pain sue to eating fatty foods around 1100-was seen on the 25 th for the same symptoms-patient has a care plan and is noncompliant with follow up care

## 2017-02-12 ENCOUNTER — Emergency Department (HOSPITAL_COMMUNITY)
Admission: EM | Admit: 2017-02-12 | Discharge: 2017-02-12 | Disposition: A | Discharge status: Home / Self Care | Payer: Self-pay | Attending: Emergency Medicine | Admitting: Emergency Medicine

## 2017-02-12 ENCOUNTER — Encounter (HOSPITAL_COMMUNITY): Payer: Self-pay | Admitting: Emergency Medicine

## 2017-02-12 DIAGNOSIS — I10 Essential (primary) hypertension: Secondary | ICD-10-CM | POA: Insufficient documentation

## 2017-02-12 DIAGNOSIS — R1012 Left upper quadrant pain: Secondary | ICD-10-CM | POA: Insufficient documentation

## 2017-02-12 DIAGNOSIS — K861 Other chronic pancreatitis: Secondary | ICD-10-CM | POA: Insufficient documentation

## 2017-02-12 DIAGNOSIS — Z87891 Personal history of nicotine dependence: Secondary | ICD-10-CM | POA: Insufficient documentation

## 2017-02-12 DIAGNOSIS — J45909 Unspecified asthma, uncomplicated: Secondary | ICD-10-CM | POA: Insufficient documentation

## 2017-02-12 DIAGNOSIS — Z79899 Other long term (current) drug therapy: Secondary | ICD-10-CM | POA: Insufficient documentation

## 2017-02-12 LAB — COMPREHENSIVE METABOLIC PANEL
ALT: 20 U/L (ref 17–63)
AST: 26 U/L (ref 15–41)
Albumin: 4.4 g/dL (ref 3.5–5.0)
Alkaline Phosphatase: 125 U/L (ref 38–126)
Anion gap: 13 (ref 5–15)
BUN: 6 mg/dL (ref 6–20)
CO2: 23 mmol/L (ref 22–32)
Calcium: 9.6 mg/dL (ref 8.9–10.3)
Chloride: 103 mmol/L (ref 101–111)
Creatinine, Ser: 0.57 mg/dL — ABNORMAL LOW (ref 0.61–1.24)
GFR calc Af Amer: >60 mL/min (ref >60)
GFR calc non Af Amer: >60 mL/min (ref >60)
Glucose, Bld: 83 mg/dL (ref 65–99)
Potassium: 4.1 mmol/L (ref 3.5–5.1)
Sodium: 139 mmol/L (ref 135–145)
Total Bilirubin: 0.4 mg/dL (ref 0.3–1.2)
Total Protein: 9.3 g/dL — ABNORMAL HIGH (ref 6.5–8.1)

## 2017-02-12 LAB — CBC WITH DIFFERENTIAL/PLATELET
Basophils Absolute: 0 10*3/uL (ref 0.0–0.1)
Basophils Relative: 0 %
Eosinophils Absolute: 0.2 10*3/uL (ref 0.0–0.7)
Eosinophils Relative: 3 %
HCT: 38.2 % — ABNORMAL LOW (ref 39.0–52.0)
Hemoglobin: 12.4 g/dL — ABNORMAL LOW (ref 13.0–17.0)
Lymphocytes Relative: 29 %
Lymphs Abs: 2.1 10*3/uL (ref 0.7–4.0)
MCH: 27.9 pg (ref 26.0–34.0)
MCHC: 32.5 g/dL (ref 30.0–36.0)
MCV: 86 fL (ref 78.0–100.0)
Monocytes Absolute: 0.6 10*3/uL (ref 0.1–1.0)
Monocytes Relative: 8 %
Neutro Abs: 4.4 10*3/uL (ref 1.7–7.7)
Neutrophils Relative %: 60 %
Platelets: 285 10*3/uL (ref 150–400)
RBC: 4.44 MIL/uL (ref 4.22–5.81)
RDW: 21.2 % — ABNORMAL HIGH (ref 11.5–15.5)
WBC: 7.3 10*3/uL (ref 4.0–10.5)

## 2017-02-12 LAB — LIPASE, BLOOD: Lipase: 72 U/L — ABNORMAL HIGH (ref 11–51)

## 2017-02-12 MED ORDER — ONDANSETRON HCL 4 MG PO TABS: 4.0000 mg | ORAL_TABLET | Freq: Four times a day (QID) | ORAL | 0 refills | Status: DC

## 2017-02-12 MED ORDER — SODIUM CHLORIDE 0.9 % IV BOLUS (SEPSIS)
1000.0000 mL | Freq: Once | INTRAVENOUS | Status: AC
  Administered 2017-02-12: 1000 mL via INTRAVENOUS

## 2017-02-12 MED ORDER — GI COCKTAIL ~~LOC~~
30.0000 mL | Freq: Once | ORAL | Status: AC
  Administered 2017-02-12: 30 mL via ORAL
  Filled 2017-02-12: qty 30

## 2017-02-12 MED ORDER — KETOROLAC TROMETHAMINE 30 MG/ML IJ SOLN
30.0000 mg | Freq: Once | INTRAMUSCULAR | Status: AC
  Administered 2017-02-12: 30 mg via INTRAVENOUS
  Filled 2017-02-12: qty 1

## 2017-02-12 MED ORDER — ONDANSETRON HCL 4 MG/2ML IJ SOLN
4.0000 mg | Freq: Once | INTRAMUSCULAR | Status: AC
  Administered 2017-02-12: 4 mg via INTRAVENOUS
  Filled 2017-02-12: qty 2

## 2017-02-12 NOTE — Discharge Instructions (Signed)
Please read attached information. If you experience any new or worsening signs or symptoms please return to the emergency room for evaluation. Please follow-up with your primary care provider or specialist as discussed.  °

## 2017-02-12 NOTE — ED Provider Notes (Signed)
[facility] DEPT Provider Note   CSN: [id number] Arrival date & time: 02/12/17  1106     History   Chief Complaint Chief Complaint  Patient presents with  . Abdominal Pain    HPI [name] is a 46 y.o. male.  HPI    46 year old male with a past medical history of chronic alcohol abuse, chronic pancreatitis, chronic gastritis presented to the emergency room with abdominal pain.  Patient reports symptoms started today with left upper quadrant abdominal pain similar to previous times flares.  Patient notes that symptoms do not radiate, reports nausea and vomiting yesterday, none today.  Patient notes he was at a cookout yesterday and was drinking alcohol.  He denies any fevers, dysuria, or any other signs or symptoms here today.   Past Medical History:  Diagnosis Date  . Alcoholism /alcohol abuse ([place])   . Anemia   . Anxiety   . Arthritis    "knees; arms; elbows" (03/26/2015)  . Asthma   . Bipolar disorder ([place])   . Chronic bronchitis ([place])   . Chronic lower back pain   . Cocaine abuse ([place])   . Depression   . Family history of adverse reaction to anesthesia    "grandmother gets confused"  . Femoral condyle fracture ([place]) 03/08/2014   left medial/notes 03/09/2014  . GERD (gastroesophageal reflux disease)   . H/O hiatal hernia   . H/O suicide attempt 10/2012  . Heart murmur    "when he was little" (03/06/2013)  . High cholesterol   . History of blood transfusion 10/2012   "when I tried to commit suicide"  . History of stomach ulcers   . Hypertension   . Marijuana abuse, continuous   . Migraine    "a few times/year" (03/26/2015)  . Pancreatitis   . Pneumonia 1990's X 3  . PTSD (post-traumatic stress disorder)   . Shortness of breath    "can happen at anytime" (03/06/2013)  . Sickle cell trait ([place])   . WPW (Wolff-Parkinson-White syndrome)    [name] 03/06/2013    Patient Active Problem List   Diagnosis Date Noted  . Acute on chronic  pancreatitis ([place]) 12/17/2016  . Chronic alcoholic pancreatitis ([place]) 12/05/2016  . Intractable nausea and vomiting 12/05/2016  . Verbally abusive behavior 12/05/2016  . Normocytic anemia 12/05/2016  . Alcohol use disorder, severe, dependence ([place]) 07/25/2016  . Cocaine use disorder, severe, dependence ([place]) 07/25/2016  . Major depressive disorder, recurrent severe without psychotic features ([place]) 07/20/2016  . Leukocytosis   . Hospital acquired PNA 05/20/2015  . Pancreatitis 05/18/2015  . Pseudocyst of pancreas 05/18/2015  . Polysubstance abuse (tobacco, cocaine, THC, and ETOH) 03/26/2015  . Alcohol-induced chronic pancreatitis ([place])   . Benign essential HTN 02/06/2014  . Alcohol-induced acute pancreatitis 11/28/2013  . Pancreatic pseudocyst/cyst 11/25/2013  . MDD (major depressive disorder), recurrent severe, without psychosis ([place]) 10/22/2013  . Severe protein-calorie malnutrition ([place]) 10/10/2013  . Suicide attempt ([place]) 10/08/2013  . 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 TOBACCO ABUSE 03/23/2007    Past Surgical History:  Procedure Laterality Date  . CARDIAC CATHETERIZATION    . EYE SURGERY Left 1990's   "result of trauma"   . FACIAL FRACTURE SURGERY Left 1990's   "result of trauma"   . FRACTURE SURGERY    . HERNIA REPAIR    . LEFT HEART CATHETERIZATION WITH CORONARY ANGIOGRAM Right 03/07/2013   Procedure: LEFT HEART CATHETERIZATION WITH CORONARY ANGIOGRAM;  Surgeon: [name], MD;  Location: [facility];  Service: Cardiovascular;  Laterality: Right;  . UMBILICAL HERNIA REPAIR         Home Medications    Prior to Admission medications   Medication Sig Start Date End Date Taking? Authorizing Provider  albuterol (PROVENTIL HFA;VENTOLIN HFA) 108 (90 Base) MCG/ACT inhaler Inhale 2 puffs into the lungs every 6 (six) hours as needed for wheezing or shortness of breath. 07/25/16  Yes Lindell Spar I, NP  cholestyramine (QUESTRAN) 4 g  packet Take 1 packet (4 g total) by mouth 3 (three) times daily as needed (diarrhea). 07/25/16  Yes Lindell Spar I, NP  famotidine (PEPCID) 20 MG tablet Take 20 mg by mouth daily.   Yes [provider]  fluticasone (FLONASE) 50 MCG/ACT nasal spray Place 1 spray into both nostrils daily as needed for rhinitis. 07/25/16  Yes Lindell Spar I, NP  gabapentin (NEURONTIN) 300 MG capsule Take 1 capsule (300 mg total) by mouth 3 (three) times daily. For agitation 07/25/16  Yes Lindell Spar I, NP  hydrALAZINE (APRESOLINE) 25 MG tablet Take 1 tablet (25 mg total) by mouth every 8 (eight) hours. Patient taking differently: Take 25 mg by mouth at bedtime.  12/29/16  Yes Barton Dubois, MD  lipase/protease/amylase (CREON) 12000 units CPEP capsule Take 2 capsules (24,000 Units total) by mouth 3 (three) times daily with meals. For pancreatitis 12/29/16  Yes Barton Dubois, MD  loperamide (IMODIUM) 2 MG capsule Take 1 capsule (2 mg total) by mouth as needed for diarrhea or loose stools. 01/19/17  Yes Georgette Shell, MD  loratadine (CLARITIN) 10 MG tablet Take 1 tablet (10 mg total) by mouth daily. (May purchase from over the counter): For allergies 07/26/16  Yes Lindell Spar I, NP  metoprolol tartrate (LOPRESSOR) 25 MG tablet Take 1 tablet (25 mg total) by mouth 2 (two) times daily. For high blood pressure 12/29/16  Yes Barton Dubois, MD  Multiple Vitamin (MULTIVITAMIN WITH MINERALS) TABS tablet Take 1 tablet by mouth daily. 01/20/17  Yes Georgette Shell, MD  Oxycodone HCl 10 MG TABS Take 1 tablet (10 mg total) by mouth every 4 (four) hours as needed. Patient taking differently: Take 10 mg by mouth every 4 (four) hours as needed.  01/25/17  Yes Julianne Rice, MD  pantoprazole (PROTONIX) 40 MG tablet Take 1 tablet (40 mg total) by mouth daily. 12/29/16  Yes Barton Dubois, MD  QUEtiapine (SEROQUEL) 25 MG tablet Take 25 mg by mouth at bedtime.   Yes [provider]  sertraline (ZOLOFT) 25 MG tablet  Take 3 tablets (75 mg total) by mouth daily. For depression 07/26/16  Yes Nwoko, Herbert Pun I, NP  Alum & Mag Hydroxide-Simeth (GI COCKTAIL) SUSP suspension Take 30 mLs by mouth 2 (two) times daily as needed for indigestion. Shake well. Patient not taking: Reported on 02/12/2017 12/29/16   Barton Dubois, MD  folic acid (FOLVITE) 1 MG tablet Take 1 tablet (1 mg total) by mouth daily. Patient not taking: Reported on 02/12/2017 01/20/17   Georgette Shell, MD  ondansetron (ZOFRAN) 4 MG tablet Take 1 tablet (4 mg total) by mouth every 6 (six) hours. 02/12/17   Shoshanah Dapper, Dellis Filbert, PA-C  thiamine 100 MG tablet Take 1 tablet (100 mg total) by mouth daily. Patient not taking: Reported on 02/12/2017 12/30/16   Barton Dubois, MD    Family History Family History  Problem Relation Age of Onset  . Hypertension Mother   . Hypertension Father   . Hypertension Other   .  Coronary artery disease Other     Social History Social History  Substance Use Topics  . Smoking status: Current Every Day Smoker    Packs/day: 1.00    Types: Cigarettes  . Smokeless tobacco: Never Used  . Alcohol use Yes     Comment: Last drink 01/14/17     Allergies   Shellfish-derived products; Trazodone and nefazodone; Robaxin [methocarbamol]; Contrast media [iodinated diagnostic agents]; and Reglan [metoclopramide]   Review of Systems Review of Systems  All other systems reviewed and are negative.    Physical Exam Updated Vital Signs BP (!) 132/96   Pulse (!) 106   Resp 16   SpO2 100%   Physical Exam  Constitutional: He is oriented to person, place, and time. He appears well-developed and well-nourished.  HENT:  Head: Normocephalic and atraumatic.  Eyes: Pupils are equal, round, and reactive to light. Conjunctivae are normal. Right eye exhibits no discharge. Left eye exhibits no discharge. No scleral icterus.  Neck: Normal range of motion. No JVD present. No tracheal deviation present.  Pulmonary/Chest: Effort normal.  No stridor.  Abdominal:  Tenderness palpation of left upper quadrant-remainder of abdominal exam normal  Neurological: He is alert and oriented to person, place, and time. Coordination normal.  Psychiatric: He has a normal mood and affect. His behavior is normal. Judgment and thought content normal.  Nursing note and vitals reviewed.    ED Treatments / Results  Labs (all labs ordered are listed, but only abnormal results are displayed) Labs Reviewed  CBC WITH DIFFERENTIAL/PLATELET - Abnormal; Notable for the following:       Result Value   Hemoglobin 12.4 (*)    HCT 38.2 (*)    RDW 21.2 (*)    All other components within normal limits  COMPREHENSIVE METABOLIC PANEL - Abnormal; Notable for the following:    Creatinine, Ser 0.57 (*)    Total Protein 9.3 (*)    All other components within normal limits  LIPASE, BLOOD - Abnormal; Notable for the following:    Lipase 72 (*)    All other components within normal limits    EKG  EKG Interpretation None       Radiology No results found.  Procedures Procedures (including critical care time)  Medications Ordered in ED Medications  sodium chloride 0.9 % bolus 1,000 mL (1,000 mLs Intravenous New Bag/Given 02/12/17 1555)  gi cocktail (Maalox,Lidocaine,Donnatal) (30 mLs Oral Given 02/12/17 1549)  ketorolac (TORADOL) 30 MG/ML injection 30 mg (30 mg Intravenous Given 02/12/17 1555)  ondansetron (ZOFRAN) injection 4 mg (4 mg Intravenous Given 02/12/17 1556)     Initial Impression / Assessment and Plan / ED Course  I have reviewed the triage vital signs and the nursing notes.  Pertinent labs & imaging results that were available during my care of the patient were reviewed by me and considered in my medical decision making (see chart for details).      Final Clinical Impressions(s) / ED Diagnoses   Final diagnoses:  Left upper quadrant pain     Labs: CBC CMP, lipase  Imaging:  Consults:  Therapeutics:  Discharge Meds:     Assessment/Plan: 46 year old male presents today for the 18th time the last 6 months.  Patient has chronic abdominal pain secondary to pancreatitis.  Patient drinking alcohol yesterday.  He is well-appearing in no acute distress, tolerating p.o. with reassuring laboratory analysis.  Patient safe for discharge with outpatient follow-up.      New Prescriptions New Prescriptions   ONDANSETRON (  ZOFRAN) 4 MG TABLET    Take 1 tablet (4 mg total) by mouth every 6 (six) hours.     Okey Regal, PA-C 02/12/17 1704    Malvin Johns, MD 02/13/17 [phone]

## 2017-02-12 NOTE — ED Triage Notes (Addendum)
Per EMS. Pt reports L abd pain and n/v since this am. Drank etoh last night. Hx of pancreatitis. Pt has been here multiple times for same, and has care plan.

## 2017-02-12 NOTE — ED Notes (Signed)
Bed: WA05 Expected date:  Expected time:  Means of arrival:  Comments: 

## 2017-02-12 NOTE — ED Notes (Signed)
Pt informed about current wait.

## 2017-02-12 NOTE — ED Notes (Signed)
Unsuccessful lab attempts x2.

## 2017-03-05 ENCOUNTER — Emergency Department (HOSPITAL_COMMUNITY): Payer: Self-pay

## 2017-03-05 ENCOUNTER — Encounter (HOSPITAL_COMMUNITY): Payer: Self-pay | Admitting: Emergency Medicine

## 2017-03-05 ENCOUNTER — Emergency Department (HOSPITAL_COMMUNITY)
Admission: EM | Admit: 2017-03-05 | Discharge: 2017-03-05 | Disposition: A | Discharge status: Home / Self Care | Payer: Self-pay | Attending: Emergency Medicine | Admitting: Emergency Medicine

## 2017-03-05 DIAGNOSIS — I10 Essential (primary) hypertension: Secondary | ICD-10-CM | POA: Insufficient documentation

## 2017-03-05 DIAGNOSIS — R1084 Generalized abdominal pain: Secondary | ICD-10-CM | POA: Insufficient documentation

## 2017-03-05 DIAGNOSIS — F17210 Nicotine dependence, cigarettes, uncomplicated: Secondary | ICD-10-CM | POA: Insufficient documentation

## 2017-03-05 DIAGNOSIS — J45909 Unspecified asthma, uncomplicated: Secondary | ICD-10-CM | POA: Insufficient documentation

## 2017-03-05 DIAGNOSIS — R112 Nausea with vomiting, unspecified: Secondary | ICD-10-CM | POA: Insufficient documentation

## 2017-03-05 DIAGNOSIS — R197 Diarrhea, unspecified: Secondary | ICD-10-CM | POA: Insufficient documentation

## 2017-03-05 DIAGNOSIS — Z79899 Other long term (current) drug therapy: Secondary | ICD-10-CM | POA: Insufficient documentation

## 2017-03-05 DIAGNOSIS — K860 Alcohol-induced chronic pancreatitis: Secondary | ICD-10-CM | POA: Insufficient documentation

## 2017-03-05 LAB — URINALYSIS, ROUTINE W REFLEX MICROSCOPIC
BACTERIA UA: NONE SEEN
Bilirubin Urine: NEGATIVE
Glucose, UA: NEGATIVE mg/dL
Hgb urine dipstick: NEGATIVE
Ketones, ur: 5 mg/dL — AB
NITRITE: NEGATIVE
PROTEIN: NEGATIVE mg/dL
SPECIFIC GRAVITY, URINE: 1.018 (ref 1.005–1.030)
SQUAMOUS EPITHELIAL / LPF: NONE SEEN
pH: 5 (ref 5.0–8.0)

## 2017-03-05 LAB — CBC WITH DIFFERENTIAL/PLATELET
BASOS ABS: 0 10*3/uL (ref 0.0–0.1)
Basophils Relative: 0 %
EOS ABS: 0.1 10*3/uL (ref 0.0–0.7)
EOS PCT: 1 %
HEMATOCRIT: 36.9 % — AB (ref 39.0–52.0)
HEMOGLOBIN: 12 g/dL — AB (ref 13.0–17.0)
Lymphocytes Relative: 23 %
Lymphs Abs: 2.3 10*3/uL (ref 0.7–4.0)
MCH: 28 pg (ref 26.0–34.0)
MCHC: 32.5 g/dL (ref 30.0–36.0)
MCV: 86.2 fL (ref 78.0–100.0)
MONO ABS: 0.7 10*3/uL (ref 0.1–1.0)
Monocytes Relative: 7 %
Neutro Abs: 7 10*3/uL (ref 1.7–7.7)
Neutrophils Relative %: 69 %
PLATELETS: 437 10*3/uL — AB (ref 150–400)
RBC: 4.28 MIL/uL (ref 4.22–5.81)
RDW: 20.7 % — AB (ref 11.5–15.5)
WBC: 10.1 10*3/uL (ref 4.0–10.5)

## 2017-03-05 LAB — COMPREHENSIVE METABOLIC PANEL
ALBUMIN: 3.4 g/dL — AB (ref 3.5–5.0)
ALK PHOS: 113 U/L (ref 38–126)
ALT: 27 U/L (ref 17–63)
ANION GAP: 9 (ref 5–15)
AST: 32 U/L (ref 15–41)
BUN: 5 mg/dL — AB (ref 6–20)
CO2: 22 mmol/L (ref 22–32)
CREATININE: 0.66 mg/dL (ref 0.61–1.24)
Calcium: 8.9 mg/dL (ref 8.9–10.3)
Chloride: 104 mmol/L (ref 101–111)
GFR calc non Af Amer: >60 mL/min (ref >60)
Glucose, Bld: 63 mg/dL — ABNORMAL LOW (ref 65–99)
Potassium: 3.8 mmol/L (ref 3.5–5.1)
SODIUM: 135 mmol/L (ref 135–145)
TOTAL PROTEIN: 7.5 g/dL (ref 6.5–8.1)
Total Bilirubin: 0.8 mg/dL (ref 0.3–1.2)

## 2017-03-05 LAB — I-STAT TROPONIN, ED: TROPONIN I, POC: 0 ng/mL (ref 0.00–0.08)

## 2017-03-05 LAB — LIPASE, BLOOD: LIPASE: 27 U/L (ref 11–51)

## 2017-03-05 MED ORDER — MORPHINE SULFATE (PF) 4 MG/ML IV SOLN
4.0000 mg | Freq: Once | INTRAVENOUS | Status: AC
  Administered 2017-03-05: 4 mg via INTRAVENOUS
  Filled 2017-03-05: qty 1

## 2017-03-05 MED ORDER — ONDANSETRON HCL 4 MG/2ML IJ SOLN: 4.0000 mg | Freq: Once | INTRAMUSCULAR | Status: DC

## 2017-03-05 MED ORDER — ONDANSETRON HCL 4 MG/2ML IJ SOLN
4.0000 mg | Freq: Once | INTRAMUSCULAR | Status: AC
  Administered 2017-03-05: 4 mg via INTRAVENOUS
  Filled 2017-03-05: qty 2

## 2017-03-05 MED ORDER — MORPHINE SULFATE (PF) 4 MG/ML IV SOLN: 4.0000 mg | Freq: Once | INTRAVENOUS | Status: DC

## 2017-03-05 MED ORDER — PANTOPRAZOLE SODIUM 40 MG IV SOLR
40.0000 mg | Freq: Once | INTRAVENOUS | Status: AC
  Administered 2017-03-05: 40 mg via INTRAVENOUS
  Filled 2017-03-05: qty 40

## 2017-03-05 MED ORDER — DICYCLOMINE HCL 20 MG PO TABS: 20.0000 mg | ORAL_TABLET | Freq: Two times a day (BID) | ORAL | 0 refills | Status: DC

## 2017-03-05 MED ORDER — ONDANSETRON HCL 4 MG PO TABS: 4.0000 mg | ORAL_TABLET | Freq: Four times a day (QID) | ORAL | 0 refills | Status: DC

## 2017-03-05 MED ORDER — SODIUM CHLORIDE 0.9 % IV BOLUS (SEPSIS)
1000.0000 mL | Freq: Once | INTRAVENOUS | Status: AC
  Administered 2017-03-05: 1000 mL via INTRAVENOUS

## 2017-03-05 MED ORDER — PROMETHAZINE HCL 25 MG/ML IJ SOLN
25.0000 mg | Freq: Once | INTRAMUSCULAR | Status: AC
  Administered 2017-03-05: 25 mg via INTRAVENOUS
  Filled 2017-03-05: qty 1

## 2017-03-05 MED ORDER — HYDROCODONE-ACETAMINOPHEN 5-325 MG PO TABS
1.0000 | ORAL_TABLET | Freq: Once | ORAL | Status: AC
  Administered 2017-03-05: 1 via ORAL
  Filled 2017-03-05: qty 1

## 2017-03-05 MED ORDER — GI COCKTAIL ~~LOC~~: 30.0000 mL | Freq: Two times a day (BID) | ORAL | 0 refills | Status: DC | PRN

## 2017-03-05 MED ORDER — FAMOTIDINE IN NACL 20-0.9 MG/50ML-% IV SOLN
20.0000 mg | Freq: Once | INTRAVENOUS | Status: AC
  Administered 2017-03-05: 20 mg via INTRAVENOUS
  Filled 2017-03-05: qty 50

## 2017-03-05 MED ORDER — GI COCKTAIL ~~LOC~~
30.0000 mL | Freq: Once | ORAL | Status: AC
  Administered 2017-03-05: 30 mL via ORAL
  Filled 2017-03-05: qty 30

## 2017-03-05 NOTE — Discharge Instructions (Signed)
Your work up has been reassuring. Your lab work is normal. You ct scan shows no signs of bowel obstruction. Does show the persistent pancreatic pseudocyst. Continue your pain medicine at home. Will add bentyl. Symptoms may be due to a viral gastritis. Clear liquid diet for 24 hours than advance diet as tolerated. Follow up with primary care doctor. REturn to the Ed if your symptoms worsen.

## 2017-03-05 NOTE — ED Notes (Signed)
Pt tolerated ginger ale and chicken broth.

## 2017-03-05 NOTE — ED Triage Notes (Signed)
Pt from home via EMS with c/o Left Lower Quadrant pain radiating into left side of chest onset 4 am today. Pt tried prescribed medications at home without relief.

## 2017-03-08 NOTE — ED Provider Notes (Signed)
[facility] EMERGENCY DEPARTMENT Provider Note   CSN: [id number] Arrival date & time: 03/05/17  1006     History   Chief Complaint Chief Complaint  Patient presents with  . Abdominal Pain  . Emesis  . Diarrhea  . Chest Pain    HPI Tristyn Margarito Dehaas is a 46 y.o. male.  HPI 46 year old [name] with past medical history significant for chronic alcohol abuse, chronic pancreatitis, chronic gastritis, polysubstance abuse that presents to the emergency department today with complaints of left lower quadrant abdominal pain that radiates to his epigastric region.  Patient states the pain started approximately 4 AM this morning.  It is unrelieved by his oxycodone and his GI cocktails at home.  States he ran out of his GI cocktails.  States this feels similar and typical of his pancreatitis pain.  Patient reports some mild nausea but denies any associated emesis.  Does report some mild nausea.  Triage note the patient complained of chest pain however my exam patient denied any chest pain or shortness of breath.  States that he did eat a lot of spicy food over the past 2 days which may have exacerbated his symptoms.  Nothing makes better or worse.  Patient also notes several episodes of nonbloody watery diarrhea that started today.  Denies any recent travel or antibiotic use.  Reports passing gas.  Has had little p.o. intake due to the nausea.  Patient does have a care plan in place.  Patient has been seen 15 times in the past 6 months for similar symptoms.  Pt denies any fever, chill, ha, vision changes, lightheadedness, dizziness, congestion, neck pain, cp, sob, cough, urinary symptoms, change in bowel habits, melena, hematochezia, lower extremity paresthesias.  Past Medical History:  Diagnosis Date  . Alcoholism /alcohol abuse ([place])   . Anemia   . Anxiety   . Arthritis    "knees; arms; elbows" (03/26/2015)  . Asthma   . Bipolar disorder ([place])   .  Chronic bronchitis ([place])   . Chronic lower back pain   . Cocaine abuse ([place])   . Depression   . Family history of adverse reaction to anesthesia    "grandmother gets confused"  . Femoral condyle fracture ([place]) 03/08/2014   left medial/notes 03/09/2014  . GERD (gastroesophageal reflux disease)   . H/O hiatal hernia   . H/O suicide attempt 10/2012  . Heart murmur    "when he was little" (03/06/2013)  . High cholesterol   . History of blood transfusion 10/2012   "when I tried to commit suicide"  . History of stomach ulcers   . Hypertension   . Marijuana abuse, continuous   . Migraine    "a few times/year" (03/26/2015)  . Pancreatitis   . Pneumonia 1990's X 3  . PTSD (post-traumatic stress disorder)   . Shortness of breath    "can happen at anytime" (03/06/2013)  . Sickle cell trait ([place])   . WPW (Wolff-Parkinson-White syndrome)    [name] 03/06/2013    Patient Active Problem List   Diagnosis Date Noted  . Acute on chronic pancreatitis ([place]) 12/17/2016  . Chronic alcoholic pancreatitis ([place]) 12/05/2016  . Intractable nausea and vomiting 12/05/2016  . Verbally abusive behavior 12/05/2016  . Normocytic anemia 12/05/2016  . Alcohol use disorder, severe, dependence ([place]) 07/25/2016  . Cocaine use disorder, severe, dependence ([place]) 07/25/2016  . Major depressive disorder, recurrent severe without psychotic features ([place]) 07/20/2016  . Leukocytosis   . Hospital acquired PNA  05/20/2015  . Pancreatitis 05/18/2015  . Pseudocyst of pancreas 05/18/2015  . Polysubstance abuse (tobacco, cocaine, THC, and ETOH) 03/26/2015  . Alcohol-induced chronic pancreatitis ([place])   . Benign essential HTN 02/06/2014  . Alcohol-induced acute pancreatitis 11/28/2013  . Pancreatic pseudocyst/cyst 11/25/2013  . MDD (major depressive disorder), recurrent severe, without psychosis ([place]) 10/22/2013  . Severe protein-calorie malnutrition ([place]) 10/10/2013  . Suicide attempt ([place]) 10/08/2013  . [name]  Parkinson White pattern seen on electrocardiogram 10/03/2012  . TOBACCO ABUSE 03/23/2007    Past Surgical History:  Procedure Laterality Date  . CARDIAC CATHETERIZATION    . EYE SURGERY Left 1990's   "result of trauma"   . FACIAL FRACTURE SURGERY Left 1990's   "result of trauma"   . FRACTURE SURGERY    . HERNIA REPAIR    . LEFT HEART CATHETERIZATION WITH CORONARY ANGIOGRAM Right 03/07/2013   Procedure: LEFT HEART CATHETERIZATION WITH CORONARY ANGIOGRAM;  Surgeon: [name], MD;  Location: [facility] CATH LAB;  Service: Cardiovascular;  Laterality: Right;  . UMBILICAL HERNIA REPAIR         Home Medications    Prior to Admission medications   Medication Sig Start Date End Date Taking? Authorizing Provider  albuterol (PROVENTIL HFA;VENTOLIN HFA) 108 (90 Base) MCG/ACT inhaler Inhale 2 puffs into the lungs every 6 (six) hours as needed for wheezing or shortness of breath. 07/25/16   Lindell Spar I, NP  Alum & Mag Hydroxide-Simeth (GI COCKTAIL) SUSP suspension Take 30 mLs by mouth 2 (two) times daily as needed for indigestion. Shake well. 03/05/17   Doristine Devoid, PA-C  cholestyramine (QUESTRAN) 4 g packet Take 1 packet (4 g total) by mouth 3 (three) times daily as needed (diarrhea). 07/25/16   Lindell Spar I, NP  dicyclomine (BENTYL) 20 MG tablet Take 1 tablet (20 mg total) by mouth 2 (two) times daily. 03/05/17   Doristine Devoid, PA-C  famotidine (PEPCID) 20 MG tablet Take 20 mg by mouth daily.    [provider]  fluticasone (FLONASE) 50 MCG/ACT nasal spray Place 1 spray into both nostrils daily as needed for rhinitis. 07/25/16   Lindell Spar I, NP  folic acid (FOLVITE) 1 MG tablet Take 1 tablet (1 mg total) by mouth daily. Patient not taking: Reported on 02/12/2017 01/20/17   Georgette Shell, MD  gabapentin (NEURONTIN) 300 MG capsule Take 1 capsule (300 mg total) by mouth 3 (three) times daily. For agitation 07/25/16   Lindell Spar I, NP  hydrALAZINE (APRESOLINE) 25 MG  tablet Take 1 tablet (25 mg total) by mouth every 8 (eight) hours. Patient taking differently: Take 25 mg by mouth at bedtime.  12/29/16   Barton Dubois, MD  lipase/protease/amylase (CREON) 12000 units CPEP capsule Take 2 capsules (24,000 Units total) by mouth 3 (three) times daily with meals. For pancreatitis 12/29/16   Barton Dubois, MD  loperamide (IMODIUM) 2 MG capsule Take 1 capsule (2 mg total) by mouth as needed for diarrhea or loose stools. 01/19/17   Georgette Shell, MD  loratadine (CLARITIN) 10 MG tablet Take 1 tablet (10 mg total) by mouth daily. (May purchase from over the counter): For allergies 07/26/16   Lindell Spar I, NP  metoprolol tartrate (LOPRESSOR) 25 MG tablet Take 1 tablet (25 mg total) by mouth 2 (two) times daily. For high blood pressure 12/29/16   Barton Dubois, MD  Multiple Vitamin (MULTIVITAMIN WITH MINERALS) TABS tablet Take 1 tablet by mouth daily. 01/20/17   Georgette Shell, MD  ondansetron (ZOFRAN) 4 MG tablet Take 1 tablet (4 mg total) by mouth every 6 (six) hours. 03/05/17   Doristine Devoid, PA-C  Oxycodone HCl 10 MG TABS Take 1 tablet (10 mg total) by mouth every 4 (four) hours as needed. Patient taking differently: Take 10 mg by mouth every 4 (four) hours as needed.  01/25/17   Julianne Rice, MD  pantoprazole (PROTONIX) 40 MG tablet Take 1 tablet (40 mg total) by mouth daily. 12/29/16   Barton Dubois, MD  QUEtiapine (SEROQUEL) 25 MG tablet Take 25 mg by mouth at bedtime.    [provider]  sertraline (ZOLOFT) 25 MG tablet Take 3 tablets (75 mg total) by mouth daily. For depression 07/26/16   Lindell Spar I, NP  thiamine 100 MG tablet Take 1 tablet (100 mg total) by mouth daily. Patient not taking: Reported on 02/12/2017 12/30/16   Barton Dubois, MD    Family History Family History  Problem Relation Age of Onset  . Hypertension Mother   . Hypertension Father   . Hypertension Other   . Coronary artery disease Other     Social  History Social History  Substance Use Topics  . Smoking status: Current Every Day Smoker    Packs/day: 1.00    Types: Cigarettes  . Smokeless tobacco: Never Used  . Alcohol use Yes     Allergies   Shellfish-derived products; Trazodone and nefazodone; Robaxin [methocarbamol]; Contrast media [iodinated diagnostic agents]; and Reglan [metoclopramide]   Review of Systems Review of Systems  Constitutional: Negative for chills and fever.  HENT: Negative for congestion and sore throat.   Eyes: Negative for visual disturbance.  Respiratory: Negative for cough and shortness of breath.   Cardiovascular: Negative for chest pain.  Gastrointestinal: Positive for abdominal pain, diarrhea, nausea and vomiting. Negative for blood in stool and constipation.  Genitourinary: Negative for dysuria, flank pain, frequency, hematuria, scrotal swelling, testicular pain and urgency.  Musculoskeletal: Negative for arthralgias and myalgias.  Skin: Negative for rash.  Neurological: Negative for dizziness, syncope, weakness, light-headedness, numbness and headaches.  Psychiatric/Behavioral: Negative for sleep disturbance. The patient is not nervous/anxious.      Physical Exam Updated Vital Signs BP (!) 145/108   Pulse 95   Temp 98.6 F (37 C) (Oral)   Resp 18   Ht 5\' 8"  (1.727 m)   Wt 63.5 kg (140 lb)   SpO2 100%   BMI 21.29 kg/m   Physical Exam  Constitutional: He is oriented to person, place, and time. He appears well-developed and well-nourished.  Non-toxic appearance. No distress.  HENT:  Head: Normocephalic and atraumatic.  Mouth/Throat: Oropharynx is clear and moist.  Eyes: Pupils are equal, round, and reactive to light. Conjunctivae are normal. Right eye exhibits no discharge. Left eye exhibits no discharge.  Neck: Normal range of motion. Neck supple.  Cardiovascular: Normal rate, regular rhythm, normal heart sounds and intact distal pulses.  Exam reveals no gallop and no friction rub.    No murmur heard. Pulmonary/Chest: Effort normal and breath sounds normal. No respiratory distress. He has no wheezes. He has no rales. He exhibits no tenderness.  Abdominal: Soft. Bowel sounds are normal. He exhibits no distension. There is tenderness in the epigastric area, left upper quadrant and left lower quadrant. There is no rigidity, no rebound, no guarding, no CVA tenderness, no tenderness at McBurney's point and negative Murphy's sign.  Musculoskeletal: Normal range of motion. He exhibits no tenderness.  Lymphadenopathy:    He has no cervical  adenopathy.  Neurological: He is alert and oriented to person, place, and time.  Skin: Skin is warm and dry. Capillary refill takes less than 2 seconds. No rash noted.  Psychiatric: His behavior is normal. Judgment and thought content normal.  Nursing note and vitals reviewed.    ED Treatments / Results  Labs (all labs ordered are listed, but only abnormal results are displayed) Labs Reviewed  COMPREHENSIVE METABOLIC PANEL - Abnormal; Notable for the following:       Result Value   Glucose, Bld 63 (*)    BUN 5 (*)    Albumin 3.4 (*)    All other components within normal limits  CBC WITH DIFFERENTIAL/PLATELET - Abnormal; Notable for the following:    Hemoglobin 12.0 (*)    HCT 36.9 (*)    RDW 20.7 (*)    Platelets 437 (*)    All other components within normal limits  URINALYSIS, ROUTINE W REFLEX MICROSCOPIC - Abnormal; Notable for the following:    Ketones, ur 5 (*)    Leukocytes, UA SMALL (*)    All other components within normal limits  LIPASE, BLOOD  I-STAT TROPONIN, ED    EKG  EKG Interpretation  Date/Time:  Sunday March 05 2017 10:43:32 EDT Ventricular Rate:  91 PR Interval:    QRS Duration: 92 QT Interval:  312 QTC Calculation: 384 R Axis:   72 Text Interpretation:  Sinus rhythm Short PR interval Right atrial enlargement Nonspecific repolarization abnormalities When compared with ECG of 11/22/2016, No significant  change was found Confirmed by Delora Fuel ([id number]) on 03/05/2017 3:18:02 PM       Radiology No results found.  Procedures Procedures (including critical care time)  Medications Ordered in ED Medications  sodium chloride 0.9 % bolus 1,000 mL (0 mLs Intravenous Stopped 03/05/17 1412)  gi cocktail (Maalox,Lidocaine,Donnatal) (30 mLs Oral Given 03/05/17 1141)  promethazine (PHENERGAN) injection 25 mg (25 mg Intravenous Given 03/05/17 1141)  famotidine (PEPCID) IVPB 20 mg premix (0 mg Intravenous Stopped 03/05/17 1258)  pantoprazole (PROTONIX) injection 40 mg (40 mg Intravenous Given 03/05/17 1141)  morphine 4 MG/ML injection 4 mg (4 mg Intravenous Given 03/05/17 1408)  morphine 4 MG/ML injection 4 mg (4 mg Intravenous Given 03/05/17 1541)  ondansetron (ZOFRAN) injection 4 mg (4 mg Intravenous Given 03/05/17 1541)  HYDROcodone-acetaminophen (NORCO/VICODIN) 5-325 MG per tablet 1 tablet (1 tablet Oral Given 03/05/17 1642)     Initial Impression / Assessment and Plan / ED Course  I have reviewed the triage vital signs and the nursing notes.  Pertinent labs & imaging results that were available during my care of the patient were reviewed by me and considered in my medical decision making (see chart for details).     Patient presents to the ED for evaluation of abdominal pain, nausea, emesis and diarrhea.  Onset this morning.  Patient is well-known to the ED for chronic pancreatitis.  Has had similar symptoms in the past.  States this feels similar of his pancreatitis flare.  Uncontrolled by his oxycodone and GI cocktail at home.  Patient continues to consume alcohol.  States he has been eating a lot of spicy food for the past 2 days which likely exacerbated his symptoms.  On exam vital signs are reassuring.  Patient is afebrile.  He overall is well-appearing and nontoxic.  He does have focal left lower quadrant epigastric pain to palpation.  No signs of peritonitis or rebound.  Lungs clear  to auscultation bilaterally.  Heart regular rate  and rhythm.  Lab work has been reassuring.  No leukocytosis.  Hemoglobin at baseline and stable.  Lipase is normal.  Liver enzymes are normal.  Glucose is slightly low at 63.  Troponin was negative.  EKG shows no signs of ischemia and is unchanged from prior tracings.  UA shows no signs of infection.  I did ordered acute abdomen to evaluate for any acute abnormalities.  Does note possible partial small bowel obstruction versus ileus but no signs of perforation.  Ultrasound had already been ordered prior to CT evaluation.  Ultrasound shows persistent pseudocyst but no acute findings.  I did discuss with my attending and given the x-ray reading a possible partial small bowel obstruction versus ileus felt that CT scan was indicated.  Patient has a contrast allergy so CT abdomen pelvis was performed without contrast.  Low suspicion for bowel obstruction given patient passing gas, able to tolerate p.o. Fluids.  CT abdomen pelvis revealed no acute findings.  Patient's pain improved with medicine in the ED.  Patient symptoms may also be due to gastritis.  Have given patient refill of his GI cocktail medicine.  Continue Prilosec and Pepcid at home.  Added on Bentyl.  Repeat abdominal exam shows no signs of peritonitis.  Patient is tolerating p.o. fluids and broth.  Patient's pain seems consistent with his chronic pancreatitis.  No signs of obstruction on CT scan.  Encourage patient to have clear liquid diet at home for 24 hours and advance diet as tolerated.  Pt is hemodynamically stable, in NAD, & able to ambulate in the ED. Evaluation does not show pathology that would require ongoing emergent intervention or inpatient treatment. I explained the diagnosis to the patient. Pain has been managed & has no complaints prior to dc. Pt is comfortable with above plan and is stable for discharge at this time. All questions were answered prior to disposition. Strict return  precautions for f/u to the ED were discussed. Encouraged follow up with PCP.  Dicussed with my attending who is agreeable to the above plan.     Final Clinical Impressions(s) / ED Diagnoses   Final diagnoses:  Nausea vomiting and diarrhea  Generalized abdominal pain    New Prescriptions Discharge Medication List as of 03/05/2017  4:31 PM    START taking these medications   Details  dicyclomine (BENTYL) 20 MG tablet Take 1 tablet (20 mg total) by mouth 2 (two) times daily., Starting Sun 03/05/2017, Print         Doristine Devoid, PA-C 03/08/17 1643    Gareth Morgan, MD 03/09/17 [phone]

## 2017-03-09 ENCOUNTER — Emergency Department (HOSPITAL_COMMUNITY)
Admission: EM | Admit: 2017-03-09 | Discharge: 2017-03-09 | Disposition: A | Discharge status: Home / Self Care | Payer: Self-pay | Attending: Emergency Medicine | Admitting: Emergency Medicine

## 2017-03-09 ENCOUNTER — Encounter (HOSPITAL_COMMUNITY): Payer: Self-pay | Admitting: Emergency Medicine

## 2017-03-09 DIAGNOSIS — F17210 Nicotine dependence, cigarettes, uncomplicated: Secondary | ICD-10-CM | POA: Insufficient documentation

## 2017-03-09 DIAGNOSIS — R112 Nausea with vomiting, unspecified: Secondary | ICD-10-CM | POA: Insufficient documentation

## 2017-03-09 DIAGNOSIS — I10 Essential (primary) hypertension: Secondary | ICD-10-CM | POA: Insufficient documentation

## 2017-03-09 DIAGNOSIS — R109 Unspecified abdominal pain: Secondary | ICD-10-CM

## 2017-03-09 DIAGNOSIS — Z79899 Other long term (current) drug therapy: Secondary | ICD-10-CM | POA: Insufficient documentation

## 2017-03-09 DIAGNOSIS — J45909 Unspecified asthma, uncomplicated: Secondary | ICD-10-CM | POA: Insufficient documentation

## 2017-03-09 DIAGNOSIS — R197 Diarrhea, unspecified: Secondary | ICD-10-CM | POA: Insufficient documentation

## 2017-03-09 DIAGNOSIS — R1012 Left upper quadrant pain: Secondary | ICD-10-CM | POA: Insufficient documentation

## 2017-03-09 DIAGNOSIS — G8929 Other chronic pain: Secondary | ICD-10-CM | POA: Insufficient documentation

## 2017-03-09 LAB — COMPREHENSIVE METABOLIC PANEL
ALBUMIN: 3.1 g/dL — AB (ref 3.5–5.0)
ALT: 24 U/L (ref 17–63)
AST: 39 U/L (ref 15–41)
Alkaline Phosphatase: 82 U/L (ref 38–126)
Anion gap: 8 (ref 5–15)
BILIRUBIN TOTAL: 0.5 mg/dL (ref 0.3–1.2)
BUN: 6 mg/dL (ref 6–20)
CHLORIDE: 108 mmol/L (ref 101–111)
CO2: 18 mmol/L — ABNORMAL LOW (ref 22–32)
CREATININE: 0.59 mg/dL — AB (ref 0.61–1.24)
Calcium: 8.2 mg/dL — ABNORMAL LOW (ref 8.9–10.3)
GFR calc Af Amer: >60 mL/min (ref >60)
GLUCOSE: 94 mg/dL (ref 65–99)
POTASSIUM: 3.8 mmol/L (ref 3.5–5.1)
Sodium: 134 mmol/L — ABNORMAL LOW (ref 135–145)
Total Protein: 6.3 g/dL — ABNORMAL LOW (ref 6.5–8.1)

## 2017-03-09 LAB — CBC
HEMATOCRIT: 31.1 % — AB (ref 39.0–52.0)
Hemoglobin: 10.2 g/dL — ABNORMAL LOW (ref 13.0–17.0)
MCH: 28.7 pg (ref 26.0–34.0)
MCHC: 32.8 g/dL (ref 30.0–36.0)
MCV: 87.4 fL (ref 78.0–100.0)
PLATELETS: 315 10*3/uL (ref 150–400)
RBC: 3.56 MIL/uL — ABNORMAL LOW (ref 4.22–5.81)
RDW: 20 % — AB (ref 11.5–15.5)
WBC: 9 10*3/uL (ref 4.0–10.5)

## 2017-03-09 LAB — LIPASE, BLOOD: LIPASE: 33 U/L (ref 11–51)

## 2017-03-09 LAB — URINALYSIS, ROUTINE W REFLEX MICROSCOPIC
BILIRUBIN URINE: NEGATIVE
GLUCOSE, UA: NEGATIVE mg/dL
HGB URINE DIPSTICK: NEGATIVE
KETONES UR: NEGATIVE mg/dL
LEUKOCYTES UA: NEGATIVE
Nitrite: NEGATIVE
PH: 5 (ref 5.0–8.0)
PROTEIN: NEGATIVE mg/dL
Specific Gravity, Urine: 1.021 (ref 1.005–1.030)

## 2017-03-09 LAB — TROPONIN I: Troponin I: <0.03 ng/mL (ref <0.03)

## 2017-03-09 MED ORDER — HALOPERIDOL LACTATE 5 MG/ML IJ SOLN
2.0000 mg | Freq: Once | INTRAMUSCULAR | Status: AC
  Administered 2017-03-09: 2 mg via INTRAVENOUS
  Filled 2017-03-09: qty 1

## 2017-03-09 MED ORDER — SODIUM CHLORIDE 0.9 % IV BOLUS (SEPSIS)
1000.0000 mL | Freq: Once | INTRAVENOUS | Status: AC
  Administered 2017-03-09: 1000 mL via INTRAVENOUS

## 2017-03-09 MED ORDER — GI COCKTAIL ~~LOC~~
30.0000 mL | Freq: Once | ORAL | Status: AC
  Administered 2017-03-09: 30 mL via ORAL
  Filled 2017-03-09: qty 30

## 2017-03-09 MED ORDER — CAPSAICIN 0.025 % EX CREA
TOPICAL_CREAM | Freq: Two times a day (BID) | CUTANEOUS | Status: DC
  Administered 2017-03-09: 11:00:00 via TOPICAL
  Filled 2017-03-09: qty 60

## 2017-03-09 MED ORDER — IBUPROFEN 800 MG PO TABS
800.0000 mg | ORAL_TABLET | Freq: Once | ORAL | Status: AC
  Administered 2017-03-09: 800 mg via ORAL
  Filled 2017-03-09: qty 1

## 2017-03-09 NOTE — ED Triage Notes (Signed)
Pt reports upper abd pain/chest pains starting this evening between 2100 and 2300. States marijuana this evening. 2 episodes of emesis, 6 episodes of loose stools.

## 2017-03-09 NOTE — Discharge Instructions (Signed)
Please avoid alcohol or marijuana use as it may worsen your condition.  Follow up with your primary care provider for further care.

## 2017-03-09 NOTE — ED Notes (Signed)
Provider at the bedside.  

## 2017-03-09 NOTE — ED Notes (Addendum)
When this RN walked by the patient's room. Pt was not in the room. [name] clothing was removed from the room. This RN looked in the waiting room and hallways. Patient not found. Not in the bathroom. Pt took out his own IV and placed it in the trash can.

## 2017-03-09 NOTE — ED Provider Notes (Signed)
[facility] EMERGENCY DEPARTMENT Provider Note   CSN: [id number] Arrival date & time: 03/09/17  0104     History   Chief Complaint Chief Complaint  Patient presents with  . Abdominal Pain    HPI Damarius Jatavius Ellenwood is a 46 y.o. male.  HPI   46 year old male with history of chronic abdominal pain, bipolar disorder, alcohol abuse, cocaine abuse, marijuana abuse, GERD presenting to the ER for evaluation of abdominal pain.  Patient has a Care Plan.  Patient reports for the past 2-3 days he has had progressive worsening abdominal pain.  He describes his pain as an aching sharp sensation to his left upper quadrant and radiates towards his epigastric region.  Pain is currently rated as an 8 out of 10, with associated nausea vomiting and diarrhea.  States he vomited 5-10 signs of nonbloody nonbilious contents and 10-15 episodes of's loose stools without blood or mucus.  He tries using his home medication with minimal relief.  Symptoms felt similar to his discomfort that was seen in the ER 3 days ago.  He did had advanced imaging including ultrasound and CT scan without any acute pathology.  He reports his last alcohol use was 2 days ago.  He does admits to using marijuana on a daily basis and last use was yesterday.  Nothing seems to make his condition better or worse.  No report of fever, headache, shortness of breath, active chest pain, dysuria, or hematuria.  Past Medical History:  Diagnosis Date  . Alcoholism /alcohol abuse ([place])   . Anemia   . Anxiety   . Arthritis    "knees; arms; elbows" (03/26/2015)  . Asthma   . Bipolar disorder ([place])   . Chronic bronchitis ([place])   . Chronic lower back pain   . Cocaine abuse ([place])   . Depression   . Family history of adverse reaction to anesthesia    "grandmother gets confused"  . Femoral condyle fracture ([place]) 03/08/2014   left medial/notes 03/09/2014  . GERD (gastroesophageal reflux disease)   . H/O hiatal hernia   .  H/O suicide attempt 10/2012  . Heart murmur    "when he was little" (03/06/2013)  . High cholesterol   . History of blood transfusion 10/2012   "when I tried to commit suicide"  . History of stomach ulcers   . Hypertension   . Marijuana abuse, continuous   . Migraine    "a few times/year" (03/26/2015)  . Pancreatitis   . Pneumonia 1990's X 3  . PTSD (post-traumatic stress disorder)   . Shortness of breath    "can happen at anytime" (03/06/2013)  . Sickle cell trait ([place])   . WPW (Wolff-Parkinson-White syndrome)    [name] 03/06/2013    Patient Active Problem List   Diagnosis Date Noted  . Acute on chronic pancreatitis ([place]) 12/17/2016  . Chronic alcoholic pancreatitis ([place]) 12/05/2016  . Intractable nausea and vomiting 12/05/2016  . Verbally abusive behavior 12/05/2016  . Normocytic anemia 12/05/2016  . Alcohol use disorder, severe, dependence ([place]) 07/25/2016  . Cocaine use disorder, severe, dependence ([place]) 07/25/2016  . Major depressive disorder, recurrent severe without psychotic features ([place]) 07/20/2016  . Leukocytosis   . Hospital acquired PNA 05/20/2015  . Pancreatitis 05/18/2015  . Pseudocyst of pancreas 05/18/2015  . Polysubstance abuse (tobacco, cocaine, THC, and ETOH) 03/26/2015  . Alcohol-induced chronic pancreatitis ([place])   . Benign essential HTN 02/06/2014  . Alcohol-induced acute pancreatitis 11/28/2013  . Pancreatic pseudocyst/cyst 11/25/2013  .  MDD (major depressive disorder), recurrent severe, without psychosis ([place]) 10/22/2013  . Severe protein-calorie malnutrition ([place]) 10/10/2013  . Suicide attempt ([place]) 10/08/2013  . 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 TOBACCO ABUSE 03/23/2007    Past Surgical History:  Procedure Laterality Date  . CARDIAC CATHETERIZATION    . EYE SURGERY Left 1990's   "result of trauma"   . FACIAL FRACTURE SURGERY Left 1990's   "result of trauma"   . FRACTURE SURGERY    . HERNIA REPAIR    .  LEFT HEART CATHETERIZATION WITH CORONARY ANGIOGRAM Right 03/07/2013   Procedure: LEFT HEART CATHETERIZATION WITH CORONARY ANGIOGRAM;  Surgeon: [name], MD;  Location: [facility] CATH [facility];  Service: Cardiovascular;  Laterality: Right;  . UMBILICAL HERNIA REPAIR         Home Medications    Prior to Admission medications   Medication Sig Start Date End Date Taking? Authorizing Provider  albuterol (PROVENTIL HFA;VENTOLIN HFA) 108 (90 Base) MCG/ACT inhaler Inhale 2 puffs into the lungs every 6 (six) hours as needed for wheezing or shortness of breath. 07/25/16   Lindell Spar I, NP  Alum & Mag Hydroxide-Simeth (GI COCKTAIL) SUSP suspension Take 30 mLs by mouth 2 (two) times daily as needed for indigestion. Shake well. 03/05/17   Doristine Devoid, PA-C  cholestyramine (QUESTRAN) 4 g packet Take 1 packet (4 g total) by mouth 3 (three) times daily as needed (diarrhea). 07/25/16   Lindell Spar I, NP  dicyclomine (BENTYL) 20 MG tablet Take 1 tablet (20 mg total) by mouth 2 (two) times daily. 03/05/17   Doristine Devoid, PA-C  famotidine (PEPCID) 20 MG tablet Take 20 mg by mouth daily.    [provider]  fluticasone (FLONASE) 50 MCG/ACT nasal spray Place 1 spray into both nostrils daily as needed for rhinitis. 07/25/16   Lindell Spar I, NP  folic acid (FOLVITE) 1 MG tablet Take 1 tablet (1 mg total) by mouth daily. Patient not taking: Reported on 02/12/2017 01/20/17   Georgette Shell, MD  gabapentin (NEURONTIN) 300 MG capsule Take 1 capsule (300 mg total) by mouth 3 (three) times daily. For agitation 07/25/16   Lindell Spar I, NP  hydrALAZINE (APRESOLINE) 25 MG tablet Take 1 tablet (25 mg total) by mouth every 8 (eight) hours. Patient taking differently: Take 25 mg by mouth at bedtime.  12/29/16   Barton Dubois, MD  lipase/protease/amylase (CREON) 12000 units CPEP capsule Take 2 capsules (24,000 Units total) by mouth 3 (three) times daily with meals. For pancreatitis 12/29/16   Barton Dubois, MD  loperamide (IMODIUM) 2 MG capsule Take 1 capsule (2 mg total) by mouth as needed for diarrhea or loose stools. 01/19/17   Georgette Shell, MD  loratadine (CLARITIN) 10 MG tablet Take 1 tablet (10 mg total) by mouth daily. (May purchase from over the counter): For allergies 07/26/16   Lindell Spar I, NP  metoprolol tartrate (LOPRESSOR) 25 MG tablet Take 1 tablet (25 mg total) by mouth 2 (two) times daily. For high blood pressure 12/29/16   Barton Dubois, MD  Multiple Vitamin (MULTIVITAMIN WITH MINERALS) TABS tablet Take 1 tablet by mouth daily. 01/20/17   Georgette Shell, MD  ondansetron (ZOFRAN) 4 MG tablet Take 1 tablet (4 mg total) by mouth every 6 (six) hours. 03/05/17   Doristine Devoid, PA-C  Oxycodone HCl 10 MG TABS Take 1 tablet (10 mg total) by mouth every 4 (four) hours as needed. Patient taking differently:  Take 10 mg by mouth every 4 (four) hours as needed.  01/25/17   Julianne Rice, MD  pantoprazole (PROTONIX) 40 MG tablet Take 1 tablet (40 mg total) by mouth daily. 12/29/16   Barton Dubois, MD  QUEtiapine (SEROQUEL) 25 MG tablet Take 25 mg by mouth at bedtime.    [provider]  sertraline (ZOLOFT) 25 MG tablet Take 3 tablets (75 mg total) by mouth daily. For depression 07/26/16   Lindell Spar I, NP  thiamine 100 MG tablet Take 1 tablet (100 mg total) by mouth daily. Patient not taking: Reported on 02/12/2017 12/30/16   Barton Dubois, MD    Family History Family History  Problem Relation Age of Onset  . Hypertension Mother   . Hypertension Father   . Hypertension Other   . Coronary artery disease Other     Social History Social History  Substance Use Topics  . Smoking status: Current Every Day Smoker    Packs/day: 1.00    Types: Cigarettes  . Smokeless tobacco: Never Used  . Alcohol use Yes     Allergies   Shellfish-derived products; Trazodone and nefazodone; Robaxin [methocarbamol]; Contrast media [iodinated diagnostic agents]; and  Reglan [metoclopramide]   Review of Systems Review of Systems  All other systems reviewed and are negative.    Physical Exam Updated Vital Signs BP (!) 125/96 (BP Location: Left Arm)   Pulse 84   Temp 98.8 F (37.1 C) (Oral)   Resp 16   Ht 5\' 9"  (1.753 m)   Wt 63.5 kg (140 lb)   SpO2 100%   BMI 20.67 kg/m   Physical Exam  Constitutional: He is oriented to person, place, and time. He appears well-developed and well-nourished. No distress.  HENT:  Head: Atraumatic.  Mouth/Throat: Oropharynx is clear and moist.  Eyes: Conjunctivae are normal.  Neck: Neck supple.  Cardiovascular: Normal rate and regular rhythm.   Pulmonary/Chest: Effort normal and breath sounds normal.  Abdominal: Bowel sounds are normal. He exhibits no distension. There is tenderness (Tenderness epigastric and left upper quadrant on palpation without guarding or rebound tenderness.  Negative Murphy sign, no pain at McBurney's point.).  Neurological: He is alert and oriented to person, place, and time.  Skin: No rash noted.  Psychiatric: He has a normal mood and affect.  Nursing note and vitals reviewed.    ED Treatments / Results  Labs (all labs ordered are listed, but only abnormal results are displayed) Labs Reviewed  COMPREHENSIVE METABOLIC PANEL - Abnormal; Notable for the following:       Result Value   Sodium 134 (*)    CO2 18 (*)    Creatinine, Ser 0.59 (*)    Calcium 8.2 (*)    Total Protein 6.3 (*)    Albumin 3.1 (*)    All other components within normal limits  CBC - Abnormal; Notable for the following:    RBC 3.56 (*)    Hemoglobin 10.2 (*)    HCT 31.1 (*)    RDW 20.0 (*)    All other components within normal limits  LIPASE, BLOOD  URINALYSIS, ROUTINE W REFLEX MICROSCOPIC  TROPONIN I    EKG  EKG Interpretation  Date/Time:  Thursday March 09 2017 01:15:37 EDT Ventricular Rate:  81 PR Interval:  104 QRS Duration: 78 QT Interval:  344 QTC Calculation: 399 R  Axis:   64 Text Interpretation:  Sinus rhythm with short PR Right atrial enlargement T wave abnormality, consider anterolateral ischemia Abnormal ECG No  significant change since last tracing Confirmed by Pryor Curia [phone]) on 03/09/2017 2:08:08 AM Also confirmed by Ward, Cyril Mourning [phone]), editor Laurena Spies [phone])  on 03/09/2017 7:28:47 AM       Radiology No results found.  Procedures Procedures (including critical care time)  Medications Ordered in ED Medications  capsaicin (ZOSTRIX) 0.025 % cream ( Topical Given 03/09/17 1040)  sodium chloride 0.9 % bolus 1,000 mL (1,000 mLs Intravenous New Bag/Given 03/09/17 0949)  haloperidol lactate (HALDOL) injection 2 mg (2 mg Intravenous Given 03/09/17 0949)  gi cocktail (Maalox,Lidocaine,Donnatal) (30 mLs Oral Given 03/09/17 0935)  ibuprofen (ADVIL,MOTRIN) tablet 800 mg (800 mg Oral Given 03/09/17 1039)     Initial Impression / Assessment and Plan / ED Course  I have reviewed the triage vital signs and the nursing notes.  Pertinent labs & imaging results that were available during my care of the patient were reviewed by me and considered in my medical decision making (see chart for details).     BP 131/89 (BP Location: Right Arm)   Pulse 62   Temp 99.2 F (37.3 C) (Oral)   Resp 16   Ht 5\' 9"  (1.753 m)   Wt 63.5 kg (140 lb)   SpO2 100%   BMI 20.67 kg/m    Final Clinical Impressions(s) / ED Diagnoses   Final diagnoses:  Chronic abdominal pain  Nausea vomiting and diarrhea    New Prescriptions New Prescriptions   No medications on file   8:57 AM This is a patient with history of chronic pancreatitis, here with a care plan due to his multiple ER visits for chronic abdominal pain.  He does have tenderness to his left upper quadrant and epigastric region on palpation.  His labs are reassuring, normal lipase.  His white count is normal.  Suspect possible cannabinoid hyperemesis syndrome causing his pain as his last marijuana  use was yesterday.  I instruct patient to avoid marijuana.  Will provide symptomatic treatment including GI cocktail, IV fluid, and Haldol.  Will monitor closely.  He is afebrile with stable normal vital signs.  11:19 AM Symptoms improve after receiving GI cocktail, IV fluid, Haldol, as well as Capsaicin cream. Patient requests to be discharged. He is stable for discharge.   Domenic Moras, PA-C 03/09/17 1119    Davonna Belling, MD 03/09/17 1505

## 2017-03-14 ENCOUNTER — Emergency Department (HOSPITAL_COMMUNITY)
Admission: EM | Admit: 2017-03-14 | Discharge: 2017-03-14 | Disposition: A | Discharge status: Home / Self Care | Payer: Self-pay | Attending: Emergency Medicine | Admitting: Emergency Medicine

## 2017-03-14 ENCOUNTER — Encounter (HOSPITAL_COMMUNITY): Payer: Self-pay | Admitting: Emergency Medicine

## 2017-03-14 ENCOUNTER — Other Ambulatory Visit: Payer: Self-pay

## 2017-03-14 DIAGNOSIS — R109 Unspecified abdominal pain: Secondary | ICD-10-CM | POA: Insufficient documentation

## 2017-03-14 DIAGNOSIS — F17210 Nicotine dependence, cigarettes, uncomplicated: Secondary | ICD-10-CM | POA: Insufficient documentation

## 2017-03-14 DIAGNOSIS — G8929 Other chronic pain: Secondary | ICD-10-CM | POA: Insufficient documentation

## 2017-03-14 DIAGNOSIS — Z79899 Other long term (current) drug therapy: Secondary | ICD-10-CM | POA: Insufficient documentation

## 2017-03-14 DIAGNOSIS — I10 Essential (primary) hypertension: Secondary | ICD-10-CM | POA: Insufficient documentation

## 2017-03-14 DIAGNOSIS — J45909 Unspecified asthma, uncomplicated: Secondary | ICD-10-CM | POA: Insufficient documentation

## 2017-03-14 LAB — CBC
HEMATOCRIT: 34.1 % — AB (ref 39.0–52.0)
HEMOGLOBIN: 11.2 g/dL — AB (ref 13.0–17.0)
MCH: 28.4 pg (ref 26.0–34.0)
MCHC: 32.8 g/dL (ref 30.0–36.0)
MCV: 86.3 fL (ref 78.0–100.0)
Platelets: 331 10*3/uL (ref 150–400)
RBC: 3.95 MIL/uL — AB (ref 4.22–5.81)
RDW: 20.3 % — ABNORMAL HIGH (ref 11.5–15.5)
WBC: 7.6 10*3/uL (ref 4.0–10.5)

## 2017-03-14 LAB — COMPREHENSIVE METABOLIC PANEL
ALBUMIN: 3.3 g/dL — AB (ref 3.5–5.0)
ALT: 35 U/L (ref 17–63)
ANION GAP: 7 (ref 5–15)
AST: 54 U/L — AB (ref 15–41)
Alkaline Phosphatase: 96 U/L (ref 38–126)
BILIRUBIN TOTAL: 0.5 mg/dL (ref 0.3–1.2)
BUN: 5 mg/dL — ABNORMAL LOW (ref 6–20)
CHLORIDE: 108 mmol/L (ref 101–111)
CO2: 21 mmol/L — ABNORMAL LOW (ref 22–32)
Calcium: 9 mg/dL (ref 8.9–10.3)
Creatinine, Ser: 0.5 mg/dL — ABNORMAL LOW (ref 0.61–1.24)
GFR calc Af Amer: >60 mL/min (ref >60)
GLUCOSE: 100 mg/dL — AB (ref 65–99)
POTASSIUM: 4.3 mmol/L (ref 3.5–5.1)
Sodium: 136 mmol/L (ref 135–145)
TOTAL PROTEIN: 7.1 g/dL (ref 6.5–8.1)

## 2017-03-14 LAB — RAPID URINE DRUG SCREEN, HOSP PERFORMED
AMPHETAMINES: NOT DETECTED
BARBITURATES: POSITIVE — AB
BENZODIAZEPINES: NOT DETECTED
COCAINE: NOT DETECTED
OPIATES: NOT DETECTED
TETRAHYDROCANNABINOL: POSITIVE — AB

## 2017-03-14 LAB — LIPASE, BLOOD: Lipase: 36 U/L (ref 11–51)

## 2017-03-14 LAB — ETHANOL

## 2017-03-14 MED ORDER — CAPSAICIN 0.025 % EX CREA
TOPICAL_CREAM | Freq: Once | CUTANEOUS | Status: AC
  Administered 2017-03-14: 15:00:00 via TOPICAL
  Filled 2017-03-14: qty 60

## 2017-03-14 MED ORDER — ONDANSETRON HCL 4 MG/2ML IJ SOLN
4.0000 mg | Freq: Once | INTRAMUSCULAR | Status: AC
  Administered 2017-03-14: 4 mg via INTRAVENOUS
  Filled 2017-03-14: qty 2

## 2017-03-14 MED ORDER — GI COCKTAIL ~~LOC~~
30.0000 mL | Freq: Once | ORAL | Status: AC
  Administered 2017-03-14: 30 mL via ORAL
  Filled 2017-03-14: qty 30

## 2017-03-14 MED ORDER — HALOPERIDOL LACTATE 5 MG/ML IJ SOLN
2.0000 mg | Freq: Once | INTRAMUSCULAR | Status: AC
  Administered 2017-03-14: 2 mg via INTRAVENOUS
  Filled 2017-03-14: qty 1

## 2017-03-14 MED ORDER — KETOROLAC TROMETHAMINE 30 MG/ML IJ SOLN
30.0000 mg | Freq: Once | INTRAMUSCULAR | Status: AC
  Administered 2017-03-14: 30 mg via INTRAVENOUS
  Filled 2017-03-14: qty 1

## 2017-03-14 MED ORDER — DICYCLOMINE HCL 10 MG PO CAPS
10.0000 mg | ORAL_CAPSULE | Freq: Once | ORAL | Status: AC
  Administered 2017-03-14: 10 mg via ORAL
  Filled 2017-03-14: qty 1

## 2017-03-14 NOTE — Discharge Instructions (Signed)
AVOID GREASY AND SPICY FOODS. KEEP TAKING ALL OF YOUR PRESCRIBED MEDICATIONS.

## 2017-03-14 NOTE — ED Notes (Signed)
Patient unhooked self from monitor and told staff member at desk he needed to give his house key to his daughter. This RN found patient walking out of waiting room outside with lit cigarette. RN offered patient nicotine pack or to take IV out. Patient states "oh im not smoking, im just waiting for my daughter." Patient voluntarily returned to room. Room smells of cigarette smoke. RN reinforced hospital policy for smoke free campus.

## 2017-03-14 NOTE — ED Provider Notes (Signed)
[facility] EMERGENCY DEPARTMENT Provider Note   CSN: [id number] Arrival date & time: 03/14/17  1315     History   Chief Complaint Chief Complaint  Patient presents with  . Abdominal Pain    HPI [name] is a 46 y.o. male.  46 year old male with past medical history including polysubstance abuse, chronic pancreatitis, chronic abdominal pain with frequent ED visits, bipolar disorder, WPW who presents with abdominal pain, vomiting, and diarrhea.  Patient states he began having epigastric abdominal pain this morning around 10 AM.  The pain is severe and radiates up into his chest.  He began having vomiting and diarrhea approximately 1 hour ago. No fevers. Last alcohol and marijuana use was 4 days ago. He took pepcid, oxycodone 10mg , and bentyl this morning without relief. He is compliant with medications including Creon.    The history is provided by the patient.    Past Medical History:  Diagnosis Date  . Alcoholism /alcohol abuse ([place])   . Anemia   . Anxiety   . Arthritis    "knees; arms; elbows" (03/26/2015)  . Asthma   . Bipolar disorder ([place])   . Chronic bronchitis ([place])   . Chronic lower back pain   . Cocaine abuse ([place])   . Depression   . Family history of adverse reaction to anesthesia    "grandmother gets confused"  . Femoral condyle fracture ([place]) 03/08/2014   left medial/notes 03/09/2014  . GERD (gastroesophageal reflux disease)   . H/O hiatal hernia   . H/O suicide attempt 10/2012  . Heart murmur    "when he was [place]" (03/06/2013)  . High cholesterol   . History of blood transfusion 10/2012   "when I tried to commit suicide"  . History of stomach ulcers   . Hypertension   . Marijuana abuse, continuous   . Migraine    "a few times/year" (03/26/2015)  . Pancreatitis   . Pneumonia 1990's X 3  . PTSD (post-traumatic stress disorder)   . Shortness of breath    "can happen at anytime" (03/06/2013)  . Sickle cell trait ([place])    . WPW (Wolff-Parkinson-White syndrome)    [name] 03/06/2013    Patient Active Problem List   Diagnosis Date Noted  . Acute on chronic pancreatitis ([place]) 12/17/2016  . Chronic alcoholic pancreatitis ([place]) 12/05/2016  . Intractable nausea and vomiting 12/05/2016  . Verbally abusive behavior 12/05/2016  . Normocytic anemia 12/05/2016  . Alcohol use disorder, severe, dependence ([place]) 07/25/2016  . Cocaine use disorder, severe, dependence ([place]) 07/25/2016  . Major depressive disorder, recurrent severe without psychotic features ([place]) 07/20/2016  . Leukocytosis   . Hospital acquired PNA 05/20/2015  . Pancreatitis 05/18/2015  . Pseudocyst of pancreas 05/18/2015  . Polysubstance abuse (tobacco, cocaine, THC, and ETOH) 03/26/2015  . Alcohol-induced chronic pancreatitis ([place])   . Benign essential HTN 02/06/2014  . Alcohol-induced acute pancreatitis 11/28/2013  . Pancreatic pseudocyst/cyst 11/25/2013  . MDD (major depressive disorder), recurrent severe, without psychosis ([place]) 10/22/2013  . Severe protein-calorie malnutrition ([place]) 10/10/2013  . Suicide attempt ([place]) 10/08/2013  . 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 TOBACCO ABUSE 03/23/2007    Past Surgical History:  Procedure Laterality Date  . CARDIAC CATHETERIZATION    . EYE SURGERY Left 1990's   "result of trauma"   . FACIAL FRACTURE SURGERY Left 1990's   "result of trauma"   . FRACTURE SURGERY    . HERNIA REPAIR    . UMBILICAL  HERNIA REPAIR         Home Medications    Prior to Admission medications   Medication Sig Start Date End Date Taking? Authorizing Provider  albuterol (PROVENTIL HFA;VENTOLIN HFA) 108 (90 Base) MCG/ACT inhaler Inhale 2 puffs into the lungs every 6 (six) hours as needed for wheezing or shortness of breath. 07/25/16  Yes Lindell Spar I, NP  Alum & Mag Hydroxide-Simeth (GI COCKTAIL) SUSP suspension Take 30 mLs by mouth 2 (two) times daily as needed for indigestion.  Shake well. 03/05/17  Yes Leaphart, Zack Seal, PA-C  cholestyramine (QUESTRAN) 4 g packet Take 1 packet (4 g total) by mouth 3 (three) times daily as needed (diarrhea). 07/25/16  Yes Lindell Spar I, NP  dicyclomine (BENTYL) 20 MG tablet Take 1 tablet (20 mg total) by mouth 2 (two) times daily. 03/05/17  Yes Leaphart, Zack Seal, PA-C  fluticasone (FLONASE) 50 MCG/ACT nasal spray Place 1 spray into both nostrils daily as needed for rhinitis. 07/25/16  Yes Lindell Spar I, NP  gabapentin (NEURONTIN) 300 MG capsule Take 1 capsule (300 mg total) by mouth 3 (three) times daily. For agitation 07/25/16  Yes Lindell Spar I, NP  hydrALAZINE (APRESOLINE) 25 MG tablet Take 1 tablet (25 mg total) by mouth every 8 (eight) hours. Patient taking differently: Take 25 mg by mouth at bedtime.  12/29/16  Yes Barton Dubois, MD  lipase/protease/amylase (CREON) 12000 units CPEP capsule Take 2 capsules (24,000 Units total) by mouth 3 (three) times daily with meals. For pancreatitis 12/29/16  Yes Barton Dubois, MD  loperamide (IMODIUM) 2 MG capsule Take 1 capsule (2 mg total) by mouth as needed for diarrhea or loose stools. 01/19/17  Yes Georgette Shell, MD  loratadine (CLARITIN) 10 MG tablet Take 1 tablet (10 mg total) by mouth daily. (May purchase from over the counter): For allergies 07/26/16  Yes Lindell Spar I, NP  metoprolol tartrate (LOPRESSOR) 25 MG tablet Take 1 tablet (25 mg total) by mouth 2 (two) times daily. For high blood pressure 12/29/16  Yes Barton Dubois, MD  Multiple Vitamin (MULTIVITAMIN WITH MINERALS) TABS tablet Take 1 tablet by mouth daily. 01/20/17  Yes Georgette Shell, MD  omeprazole (PRILOSEC) 20 MG capsule Take 20 mg by mouth daily.   Yes [provider]  ondansetron (ZOFRAN) 4 MG tablet Take 1 tablet (4 mg total) by mouth every 6 (six) hours. Patient taking differently: Take 4 mg every 8 (eight) hours as needed by mouth for nausea.  03/05/17  Yes Leaphart, Zack Seal, PA-C  Oxycodone HCl  10 MG TABS Take 1 tablet (10 mg total) by mouth every 4 (four) hours as needed. Patient taking differently: Take 10 mg by mouth every 4 (four) hours as needed.  01/25/17  Yes Julianne Rice, MD  pantoprazole (PROTONIX) 40 MG tablet Take 1 tablet (40 mg total) by mouth daily. 12/29/16  Yes Barton Dubois, MD  QUEtiapine (SEROQUEL) 25 MG tablet Take 25 mg by mouth at bedtime.   Yes [provider]  sertraline (ZOLOFT) 25 MG tablet Take 3 tablets (75 mg total) by mouth daily. For depression 07/26/16  Yes Lindell Spar I, NP  tetrahydrozoline 0.05 % ophthalmic solution Place 1 drop into both eyes daily as needed (itching).   Yes [provider]  folic acid (FOLVITE) 1 MG tablet Take 1 tablet (1 mg total) by mouth daily. Patient not taking: Reported on 02/12/2017 01/20/17   Georgette Shell, MD  thiamine 100 MG tablet Take 1 tablet (100  mg total) by mouth daily. Patient not taking: Reported on 02/12/2017 12/30/16   Barton Dubois, MD    Family History Family History  Problem Relation Age of Onset  . Hypertension Mother   . Hypertension Father   . Hypertension Other   . Coronary artery disease Other     Social History Social History   Tobacco Use  . Smoking status: Current Every Day Smoker    Packs/day: 1.00    Types: Cigarettes  . Smokeless tobacco: Never Used  Substance Use Topics  . Alcohol use: Yes  . Drug use: Yes    Types: Marijuana, Cocaine     Allergies   Robaxin [methocarbamol]; Shellfish-derived products; Trazodone and nefazodone; Contrast media [iodinated diagnostic agents]; and Reglan [metoclopramide]   Review of Systems Review of Systems All other systems reviewed and are negative except that which was mentioned in HPI   Physical Exam Updated Vital Signs BP (!) 150/96   Pulse (!) 111   Resp 18   SpO2 100%   Physical Exam  Constitutional: He is oriented to person, place, and time. He appears well-developed and well-nourished. No distress.    Uncomfortable, holding abdomen  HENT:  Head: Normocephalic and atraumatic.  Moist mucous membranes  Eyes: Conjunctivae are normal. Pupils are equal, round, and reactive to light.  Neck: Neck supple.  Cardiovascular: Normal rate, regular rhythm and normal heart sounds.  No murmur heard. Pulmonary/Chest: Effort normal and breath sounds normal.  Abdominal: Soft. Bowel sounds are normal. He exhibits no distension. There is tenderness (mild) in the epigastric area. There is no rebound and no guarding.  Musculoskeletal: He exhibits no edema.  Neurological: He is alert and oriented to person, place, and time.  Fluent speech  Skin: Skin is warm and dry.  Psychiatric: Judgment normal.  distressed  Nursing note and vitals reviewed.    ED Treatments / Results  Labs (all labs ordered are listed, but only abnormal results are displayed) Labs Reviewed  COMPREHENSIVE METABOLIC PANEL - Abnormal; Notable for the following components:      Result Value   CO2 21 (*)    Glucose, Bld 100 (*)    BUN 5 (*)    Creatinine, Ser 0.50 (*)    Albumin 3.3 (*)    AST 54 (*)    All other components within normal limits  CBC - Abnormal; Notable for the following components:   RBC 3.95 (*)    Hemoglobin 11.2 (*)    HCT 34.1 (*)    RDW 20.3 (*)    All other components within normal limits  RAPID URINE DRUG SCREEN, HOSP PERFORMED - Abnormal; Notable for the following components:   Tetrahydrocannabinol POSITIVE (*)    Barbiturates POSITIVE (*)    All other components within normal limits  ETHANOL  LIPASE, BLOOD  I-STAT TROPONIN, ED    EKG  EKG Interpretation  Date/Time:  Tuesday March 14 2017 13:03:50 EST Ventricular Rate:  93 PR Interval:  104 QRS Duration: 80 QT Interval:  326 QTC Calculation: 405 R Axis:   79 Text Interpretation:  Sinus rhythm with short PR Biatrial enlargement T wave abnormality, consider anterior ischemia Abnormal ECG No significant change since last tracing Confirmed  by Theotis Burrow [id number]) on 03/14/2017 1:42:55 PM       Radiology No results found.  Procedures Procedures (including critical care time)  Medications Ordered in ED Medications  haloperidol lactate (HALDOL) injection 2 mg (2 mg Intravenous Given 03/14/17 1341)  capsaicin (ZOSTRIX) 0.025 %  cream ( Topical Given 03/14/17 1520)  ondansetron (ZOFRAN) injection 4 mg (4 mg Intravenous Given 03/14/17 1350)  gi cocktail (Maalox,Lidocaine,Donnatal) (30 mLs Oral Given 03/14/17 1350)  ketorolac (TORADOL) 30 MG/ML injection 30 mg (30 mg Intravenous Given 03/14/17 1517)  dicyclomine (BENTYL) capsule 10 mg (10 mg Oral Given 03/14/17 1518)     Initial Impression / Assessment and Plan / ED Course  I have reviewed the triage vital signs and the nursing notes.  Pertinent labs that were available during my care of the patient were reviewed by me and considered in my medical decision making (see chart for details).    Pt w/ h/o frequent ED visits related to abdominal pain, vomiting and diarrhea p/w similar sx beginning this morning. Non-toxic on exam w/ normal VS. Epigastric tenderness w/ no lower abd pain on exam. Gave haldol, zofran, capsaicin cream, and GI cocktail. EKG unchanged from previous.  All lab work reassuring.  To receiving above medications, patient was ambulatory and requested to leave so that he can go smoke.  He felt better and was tolerating liquids.  He did admit to eating hamburgers last night and I recommended avoiding greasy and spicy foods and staying with bland diet until his symptoms improve.  Instructed to continue all of his prescribed medications.  Patient discharged in satisfactory condition.  Final Clinical Impressions(s) / ED Diagnoses   Final diagnoses:  Chronic abdominal pain    ED Discharge Orders    None       Ahman Dugdale, Wenda Overland, MD 03/14/17 1557

## 2017-03-14 NOTE — ED Triage Notes (Signed)
Pt arrives via [facility] from home for c/o LLQ pain with n/v/d x1 hour, upon arrival pt started c/o chest pain as well. Pt a/ox4, resp e/u, nad. Received 324mg  asa pta.

## 2017-03-15 LAB — I-STAT TROPONIN, ED: TROPONIN I, POC: 0 ng/mL (ref 0.00–0.08)

## 2017-03-18 ENCOUNTER — Other Ambulatory Visit: Payer: Self-pay

## 2017-03-18 ENCOUNTER — Emergency Department (HOSPITAL_COMMUNITY): Payer: Self-pay

## 2017-03-18 ENCOUNTER — Inpatient Hospital Stay (HOSPITAL_COMMUNITY)
Admission: EM | Admit: 2017-03-18 | Discharge: 2017-03-20 | DRG: 438 | Discharge status: Against Medical Advice | Payer: Self-pay | Attending: Internal Medicine | Admitting: Internal Medicine

## 2017-03-18 ENCOUNTER — Emergency Department (HOSPITAL_COMMUNITY)
Admission: EM | Admit: 2017-03-18 | Discharge: 2017-03-18 | Disposition: A | Discharge status: Home / Self Care | Payer: Self-pay | Attending: Emergency Medicine | Admitting: Emergency Medicine

## 2017-03-18 DIAGNOSIS — K860 Alcohol-induced chronic pancreatitis: Secondary | ICD-10-CM

## 2017-03-18 DIAGNOSIS — K863 Pseudocyst of pancreas: Secondary | ICD-10-CM | POA: Diagnosis present

## 2017-03-18 DIAGNOSIS — Z811 Family history of alcohol abuse and dependence: Secondary | ICD-10-CM

## 2017-03-18 DIAGNOSIS — F17210 Nicotine dependence, cigarettes, uncomplicated: Secondary | ICD-10-CM | POA: Diagnosis present

## 2017-03-18 DIAGNOSIS — F10239 Alcohol dependence with withdrawal, unspecified: Secondary | ICD-10-CM | POA: Diagnosis present

## 2017-03-18 DIAGNOSIS — K861 Other chronic pancreatitis: Secondary | ICD-10-CM

## 2017-03-18 DIAGNOSIS — R1115 Cyclical vomiting syndrome unrelated to migraine: Secondary | ICD-10-CM

## 2017-03-18 DIAGNOSIS — E43 Unspecified severe protein-calorie malnutrition: Secondary | ICD-10-CM | POA: Diagnosis present

## 2017-03-18 DIAGNOSIS — Z681 Body mass index (BMI) 19 or less, adult: Secondary | ICD-10-CM

## 2017-03-18 DIAGNOSIS — R1013 Epigastric pain: Secondary | ICD-10-CM

## 2017-03-18 DIAGNOSIS — D649 Anemia, unspecified: Secondary | ICD-10-CM | POA: Diagnosis present

## 2017-03-18 DIAGNOSIS — K852 Alcohol induced acute pancreatitis without necrosis or infection: Principal | ICD-10-CM | POA: Diagnosis present

## 2017-03-18 DIAGNOSIS — R000 Tachycardia, unspecified: Secondary | ICD-10-CM

## 2017-03-18 DIAGNOSIS — R112 Nausea with vomiting, unspecified: Secondary | ICD-10-CM

## 2017-03-18 DIAGNOSIS — D638 Anemia in other chronic diseases classified elsewhere: Secondary | ICD-10-CM

## 2017-03-18 DIAGNOSIS — Z808 Family history of malignant neoplasm of other organs or systems: Secondary | ICD-10-CM

## 2017-03-18 DIAGNOSIS — I10 Essential (primary) hypertension: Secondary | ICD-10-CM | POA: Insufficient documentation

## 2017-03-18 DIAGNOSIS — R197 Diarrhea, unspecified: Secondary | ICD-10-CM

## 2017-03-18 DIAGNOSIS — J45909 Unspecified asthma, uncomplicated: Secondary | ICD-10-CM | POA: Insufficient documentation

## 2017-03-18 DIAGNOSIS — Z79899 Other long term (current) drug therapy: Secondary | ICD-10-CM | POA: Insufficient documentation

## 2017-03-18 LAB — CBC WITH DIFFERENTIAL/PLATELET
BASOS ABS: 0 10*3/uL (ref 0.0–0.1)
Basophils Relative: 0 %
EOS ABS: 0.2 10*3/uL (ref 0.0–0.7)
EOS PCT: 1 %
HEMATOCRIT: 34.2 % — AB (ref 39.0–52.0)
Hemoglobin: 11.5 g/dL — ABNORMAL LOW (ref 13.0–17.0)
LYMPHS ABS: 3.1 10*3/uL (ref 0.7–4.0)
Lymphocytes Relative: 15 %
MCH: 29.5 pg (ref 26.0–34.0)
MCHC: 33.6 g/dL (ref 30.0–36.0)
MCV: 87.7 fL (ref 78.0–100.0)
MONO ABS: 1.4 10*3/uL — AB (ref 0.1–1.0)
Monocytes Relative: 7 %
NEUTROS PCT: 77 %
Neutro Abs: 15.9 10*3/uL — ABNORMAL HIGH (ref 1.7–7.7)
PLATELETS: 374 10*3/uL (ref 150–400)
RBC: 3.9 MIL/uL — AB (ref 4.22–5.81)
RDW: 20 % — AB (ref 11.5–15.5)
WBC: 20.6 10*3/uL — AB (ref 4.0–10.5)

## 2017-03-18 LAB — COMPREHENSIVE METABOLIC PANEL
ALBUMIN: 3.7 g/dL (ref 3.5–5.0)
ALK PHOS: 94 U/L (ref 38–126)
ALT: 27 U/L (ref 17–63)
ALT: 26 U/L (ref 17–63)
ANION GAP: 10 (ref 5–15)
AST: 42 U/L — ABNORMAL HIGH (ref 15–41)
AST: 39 U/L (ref 15–41)
Albumin: 4.1 g/dL (ref 3.5–5.0)
Alkaline Phosphatase: 99 U/L (ref 38–126)
Anion gap: 14 (ref 5–15)
BILIRUBIN TOTAL: 0.6 mg/dL (ref 0.3–1.2)
BUN: 7 mg/dL (ref 6–20)
BUN: 9 mg/dL (ref 6–20)
CALCIUM: 8.9 mg/dL (ref 8.9–10.3)
CO2: 20 mmol/L — ABNORMAL LOW (ref 22–32)
CO2: 17 mmol/L — ABNORMAL LOW (ref 22–32)
Calcium: 8.8 mg/dL — ABNORMAL LOW (ref 8.9–10.3)
Chloride: 104 mmol/L (ref 101–111)
Chloride: 105 mmol/L (ref 101–111)
Creatinine, Ser: 0.6 mg/dL — ABNORMAL LOW (ref 0.61–1.24)
Creatinine, Ser: 0.65 mg/dL (ref 0.61–1.24)
GFR calc Af Amer: >60 mL/min (ref >60)
GFR calc non Af Amer: >60 mL/min (ref >60)
Glucose, Bld: 78 mg/dL (ref 65–99)
Glucose, Bld: 63 mg/dL — ABNORMAL LOW (ref 65–99)
POTASSIUM: 4.2 mmol/L (ref 3.5–5.1)
Potassium: 3.7 mmol/L (ref 3.5–5.1)
Sodium: 134 mmol/L — ABNORMAL LOW (ref 135–145)
Sodium: 136 mmol/L (ref 135–145)
TOTAL PROTEIN: 7.4 g/dL (ref 6.5–8.1)
Total Bilirubin: 0.7 mg/dL (ref 0.3–1.2)
Total Protein: 8 g/dL (ref 6.5–8.1)

## 2017-03-18 LAB — CBC
HCT: 35.4 % — ABNORMAL LOW (ref 39.0–52.0)
Hemoglobin: 11.5 g/dL — ABNORMAL LOW (ref 13.0–17.0)
MCH: 29 pg (ref 26.0–34.0)
MCHC: 32.5 g/dL (ref 30.0–36.0)
MCV: 89.2 fL (ref 78.0–100.0)
Platelets: 309 10*3/uL (ref 150–400)
RBC: 3.97 MIL/uL — ABNORMAL LOW (ref 4.22–5.81)
RDW: 19.9 % — ABNORMAL HIGH (ref 11.5–15.5)
WBC: 12.2 10*3/uL — ABNORMAL HIGH (ref 4.0–10.5)

## 2017-03-18 LAB — ETHANOL: ALCOHOL ETHYL (B): 37 mg/dL — AB (ref <10)

## 2017-03-18 LAB — URINALYSIS, ROUTINE W REFLEX MICROSCOPIC
Bilirubin Urine: NEGATIVE
Glucose, UA: NEGATIVE mg/dL
Hgb urine dipstick: NEGATIVE
Ketones, ur: 5 mg/dL — AB
Leukocytes, UA: NEGATIVE
Nitrite: NEGATIVE
Protein, ur: NEGATIVE mg/dL
Specific Gravity, Urine: 1.017 (ref 1.005–1.030)
pH: 5 (ref 5.0–8.0)

## 2017-03-18 LAB — LIPASE, BLOOD
LIPASE: 55 U/L — AB (ref 11–51)
Lipase: 68 U/L — ABNORMAL HIGH (ref 11–51)

## 2017-03-18 MED ORDER — MORPHINE SULFATE (PF) 4 MG/ML IV SOLN
4.0000 mg | Freq: Once | INTRAVENOUS | Status: AC
  Administered 2017-03-18: 4 mg via INTRAVENOUS
  Filled 2017-03-18: qty 1

## 2017-03-18 MED ORDER — PROMETHAZINE HCL 25 MG/ML IJ SOLN
25.0000 mg | Freq: Once | INTRAMUSCULAR | Status: AC
  Administered 2017-03-18: 25 mg via INTRAVENOUS
  Filled 2017-03-18: qty 1

## 2017-03-18 MED ORDER — GI COCKTAIL ~~LOC~~
30.0000 mL | Freq: Once | ORAL | Status: AC
  Administered 2017-03-18: 30 mL via ORAL
  Filled 2017-03-18: qty 30

## 2017-03-18 MED ORDER — ONDANSETRON HCL 4 MG/2ML IJ SOLN
4.0000 mg | Freq: Four times a day (QID) | INTRAMUSCULAR | Status: DC | PRN
  Administered 2017-03-19 – 2017-03-20 (×2): 4 mg via INTRAVENOUS
  Filled 2017-03-18 (×2): qty 2

## 2017-03-18 MED ORDER — SODIUM CHLORIDE 0.9 % IV BOLUS (SEPSIS)
1000.0000 mL | Freq: Once | INTRAVENOUS | Status: AC
  Administered 2017-03-18: 1000 mL via INTRAVENOUS

## 2017-03-18 MED ORDER — VITAMIN B-1 100 MG PO TABS
100.0000 mg | ORAL_TABLET | Freq: Every day | ORAL | Status: DC
  Administered 2017-03-19: 100 mg via ORAL
  Filled 2017-03-18: qty 1

## 2017-03-18 MED ORDER — HYDROMORPHONE HCL 1 MG/ML IJ SOLN
1.0000 mg | INTRAMUSCULAR | Status: DC | PRN
  Administered 2017-03-18 – 2017-03-19 (×3): 1 mg via INTRAVENOUS
  Filled 2017-03-18 (×3): qty 1

## 2017-03-18 MED ORDER — ONDANSETRON HCL 4 MG/2ML IJ SOLN
4.0000 mg | Freq: Once | INTRAMUSCULAR | Status: AC
  Administered 2017-03-18: 4 mg via INTRAVENOUS

## 2017-03-18 MED ORDER — SODIUM CHLORIDE 0.9 % IV SOLN
INTRAVENOUS | Status: DC
  Administered 2017-03-18 – 2017-03-20 (×5): via INTRAVENOUS

## 2017-03-18 MED ORDER — ADULT MULTIVITAMIN W/MINERALS CH
1.0000 | ORAL_TABLET | Freq: Every day | ORAL | Status: DC
  Administered 2017-03-19 – 2017-03-20 (×2): 1 via ORAL
  Filled 2017-03-18 (×2): qty 1

## 2017-03-18 MED ORDER — LORAZEPAM 2 MG/ML IJ SOLN
1.0000 mg | Freq: Four times a day (QID) | INTRAMUSCULAR | Status: DC | PRN
  Administered 2017-03-18 – 2017-03-20 (×3): 1 mg via INTRAVENOUS
  Filled 2017-03-18 (×3): qty 1

## 2017-03-18 MED ORDER — ONDANSETRON HCL 4 MG/2ML IJ SOLN
INTRAMUSCULAR | Status: AC
  Administered 2017-03-18: 4 mg via INTRAVENOUS
  Filled 2017-03-18: qty 2

## 2017-03-18 MED ORDER — ONDANSETRON HCL 4 MG/2ML IJ SOLN
4.0000 mg | Freq: Once | INTRAMUSCULAR | Status: AC
  Administered 2017-03-18: 4 mg via INTRAVENOUS
  Filled 2017-03-18: qty 2

## 2017-03-18 MED ORDER — OXYCODONE-ACETAMINOPHEN 5-325 MG PO TABS
2.0000 | ORAL_TABLET | Freq: Once | ORAL | Status: AC
  Administered 2017-03-18: 2 via ORAL
  Filled 2017-03-18: qty 2

## 2017-03-18 MED ORDER — HYDRALAZINE HCL 20 MG/ML IJ SOLN: 5.0000 mg | Freq: Four times a day (QID) | INTRAMUSCULAR | Status: DC | PRN

## 2017-03-18 MED ORDER — HYDROMORPHONE HCL 1 MG/ML IJ SOLN
1.0000 mg | Freq: Once | INTRAMUSCULAR | Status: AC
  Administered 2017-03-18: 1 mg via INTRAVENOUS
  Filled 2017-03-18: qty 1

## 2017-03-18 MED ORDER — FOLIC ACID 1 MG PO TABS
1.0000 mg | ORAL_TABLET | Freq: Every day | ORAL | Status: DC
  Administered 2017-03-19 – 2017-03-20 (×2): 1 mg via ORAL
  Filled 2017-03-18 (×2): qty 1

## 2017-03-18 MED ORDER — NICOTINE 21 MG/24HR TD PT24
21.0000 mg | MEDICATED_PATCH | Freq: Every day | TRANSDERMAL | Status: DC
  Administered 2017-03-19 – 2017-03-20 (×3): 21 mg via TRANSDERMAL
  Filled 2017-03-18 (×3): qty 1

## 2017-03-18 MED ORDER — LORAZEPAM 2 MG/ML IJ SOLN
1.0000 mg | Freq: Once | INTRAMUSCULAR | Status: AC
  Administered 2017-03-18: 1 mg via INTRAVENOUS
  Filled 2017-03-18: qty 1

## 2017-03-18 MED ORDER — PANTOPRAZOLE SODIUM 40 MG PO TBEC
40.0000 mg | DELAYED_RELEASE_TABLET | Freq: Two times a day (BID) | ORAL | Status: DC
  Administered 2017-03-18 – 2017-03-20 (×4): 40 mg via ORAL
  Filled 2017-03-18 (×4): qty 1

## 2017-03-18 MED ORDER — LORAZEPAM 1 MG PO TABS
1.0000 mg | ORAL_TABLET | Freq: Four times a day (QID) | ORAL | Status: DC | PRN
  Administered 2017-03-19 – 2017-03-20 (×3): 1 mg via ORAL
  Filled 2017-03-18 (×3): qty 1

## 2017-03-18 MED ORDER — ENOXAPARIN SODIUM 40 MG/0.4ML ~~LOC~~ SOLN
40.0000 mg | Freq: Every day | SUBCUTANEOUS | Status: DC
  Administered 2017-03-18 – 2017-03-19 (×2): 40 mg via SUBCUTANEOUS
  Filled 2017-03-18 (×2): qty 0.4

## 2017-03-18 MED ORDER — ONDANSETRON 4 MG PO TBDP
4.0000 mg | ORAL_TABLET | Freq: Once | ORAL | Status: AC | PRN
Start: 2017-03-18 — End: 2017-03-18
  Administered 2017-03-18: 4 mg via ORAL
  Filled 2017-03-18: qty 1

## 2017-03-18 MED ORDER — KETOROLAC TROMETHAMINE 30 MG/ML IJ SOLN
30.0000 mg | Freq: Once | INTRAMUSCULAR | Status: AC
  Administered 2017-03-18: 30 mg via INTRAVENOUS
  Filled 2017-03-18: qty 1

## 2017-03-18 MED ORDER — CHLORDIAZEPOXIDE HCL 25 MG PO CAPS
25.0000 mg | ORAL_CAPSULE | Freq: Every day | ORAL | Status: DC
  Administered 2017-03-19 – 2017-03-20 (×2): 25 mg via ORAL
  Filled 2017-03-18 (×2): qty 1

## 2017-03-18 MED ORDER — ONDANSETRON HCL 4 MG PO TABS: 4.0000 mg | ORAL_TABLET | Freq: Four times a day (QID) | ORAL | 0 refills | Status: DC

## 2017-03-18 MED ORDER — IOPAMIDOL (ISOVUE-300) INJECTION 61%
INTRAVENOUS | Status: AC
  Administered 2017-03-18: 100 mL via INTRAVENOUS
  Filled 2017-03-18: qty 100

## 2017-03-18 MED ORDER — THIAMINE HCL 100 MG/ML IJ SOLN
100.0000 mg | Freq: Every day | INTRAMUSCULAR | Status: DC
Start: 2017-03-19 — End: 2017-03-20
  Administered 2017-03-20: 100 mg via INTRAVENOUS
  Filled 2017-03-18: qty 2

## 2017-03-18 MED ORDER — ACETAMINOPHEN 650 MG RE SUPP: 650.0000 mg | Freq: Four times a day (QID) | RECTAL | Status: DC | PRN

## 2017-03-18 MED ORDER — SUCRALFATE 1 G PO TABS
1.0000 g | ORAL_TABLET | Freq: Once | ORAL | Status: AC
  Administered 2017-03-18: 1 g via ORAL
  Filled 2017-03-18: qty 1

## 2017-03-18 MED ORDER — ACETAMINOPHEN 325 MG PO TABS: 650.0000 mg | ORAL_TABLET | Freq: Four times a day (QID) | ORAL | Status: DC | PRN

## 2017-03-18 MED ORDER — ONDANSETRON HCL 4 MG/2ML IJ SOLN
4.0000 mg | Freq: Once | INTRAMUSCULAR | Status: AC
Start: 2017-03-18 — End: 2017-03-18
  Administered 2017-03-18: 4 mg via INTRAVENOUS
  Filled 2017-03-18: qty 2

## 2017-03-18 MED ORDER — FAMOTIDINE IN NACL 20-0.9 MG/50ML-% IV SOLN
20.0000 mg | Freq: Once | INTRAVENOUS | Status: AC
  Administered 2017-03-18: 20 mg via INTRAVENOUS
  Filled 2017-03-18: qty 50

## 2017-03-18 MED ORDER — CHLORDIAZEPOXIDE HCL 25 MG PO CAPS
50.0000 mg | ORAL_CAPSULE | Freq: Every day | ORAL | Status: AC
  Administered 2017-03-18: 50 mg via ORAL
  Filled 2017-03-18: qty 2

## 2017-03-18 NOTE — ED Notes (Addendum)
Pt unable to tolerate PO. Pt vomited food.

## 2017-03-18 NOTE — ED Notes (Signed)
Pt provided ginger ale for po/fluid challenge

## 2017-03-18 NOTE — Progress Notes (Signed)
Please call Verdis Frederickson, RN at [phone] at [phone] for report. Thanks!

## 2017-03-18 NOTE — H&P (Addendum)
TRH H&P   Patient Demographics:    [name], is a 46 y.o. male  MRN: [id number]   DOB - 07/13/1970  Admit Date - 03/18/2017  Outpatient Primary MD for the patient is [name], [name], NP NONE, [facility]  Referring MD/NP/PA:  [name] PA  Outpatient Specialists:   Patient coming from: home  Chief Complaint  Patient presents with  . Abdominal Pain      HPI:    [name]  is a 46 y.o. male, w hypertension, etoh abuse, c/o drinking last nite (beers and wine),  And had n/v starting at 3 am.  No hematemesis.   Pt also noted abdominal pain perimumbilical, "sharp" and "stabbing"  w radiation to the back.   Slight loose stool.  Pt denies constipation, fever, chills, brbpr, black stool . Pt admits to 8 ibuprofen over the past 2 days.  No new medication.  Pt presented due to n/v, abdominal pain.   In ED,  Wbc 12.2, Hgb 11.5, Plt 309 Bun 9, Creatinine 0.65 Hco3=17 Lipase 68  CT scan  IMPRESSION: 1. Findings consistent with mild acute on chronic pancreatitis. 2. Fluid collections within the parenchyma the pancreas consistent small pseudocysts. 3. No organized fluid collections of beyond the parenchyma of the pancreas. 4. Sludge within the gallbladder . 5.  Aortic Atherosclerosis (ICD10-I70.0).   Pt will be admitted for etoh induced pancreatitis.      Review of systems:    In addition to the HPI above,   No Fever-chills, No Headache, No changes with Vision or hearing, No problems swallowing food or Liquids, No Chest pain, Cough or Shortness of Breath, No Abdominal pain, No Blood in stool or Urine, No dysuria, No new skin rashes or bruises, No new joints pains-aches,  No new weakness, tingling, numbness in any extremity, No recent weight gain or loss, No polyuria, polydypsia or polyphagia, No significant Mental Stressors.  A  full 10 point Review of Systems was done, except as stated above, all other Review of Systems were negative.   With Past History of the following :    Past Medical History:  Diagnosis Date  . Alcoholism /alcohol abuse ([name])   . Anemia   . Anxiety   . Arthritis    "knees; arms; elbows" (03/26/2015)  . Asthma   . Bipolar disorder ([place])   . Chronic bronchitis ([name])   . Chronic lower back pain   . Cocaine abuse ([name])   . Depression   . Family history of adverse reaction to anesthesia    "grandmother gets confused"  . Femoral condyle fracture ([name]) 03/08/2014   left medial/notes 03/09/2014  . GERD (gastroesophageal reflux disease)   . H/O hiatal hernia   . H/O suicide attempt 10/2012  . Heart murmur    "when he was little" (03/06/2013)  . High cholesterol   . History  of blood transfusion 10/2012   "when I tried to commit suicide"  . History of stomach ulcers   . Hypertension   . Marijuana abuse, continuous   . Migraine    "a few times/year" (03/26/2015)  . Pancreatitis   . Pneumonia 1990's X 3  . PTSD (post-traumatic stress disorder)   . Shortness of breath    "can happen at anytime" (03/06/2013)  . Sickle cell trait ([place])   . WPW (Wolff-Parkinson-White syndrome)    [name] 03/06/2013      Past Surgical History:  Procedure Laterality Date  . CARDIAC CATHETERIZATION    . EYE SURGERY Left 1990's   "result of trauma"   . FACIAL FRACTURE SURGERY Left 1990's   "result of trauma"   . FRACTURE SURGERY    . HERNIA REPAIR    . LEFT HEART CATHETERIZATION WITH CORONARY ANGIOGRAM Right 03/07/2013   Procedure: LEFT HEART CATHETERIZATION WITH CORONARY ANGIOGRAM;  Surgeon: [name], MD;  Location: [facility];  Service: Cardiovascular;  Laterality: Right;  . UMBILICAL HERNIA REPAIR        Social History:     Social History   Tobacco Use  . Smoking status: Current Every Day Smoker    Packs/day: 1.00    Types: Cigarettes, E-cigarettes  . Smokeless tobacco: Never Used   Substance Use Topics  . Alcohol use: Yes    Comment: last drink 07/01/17     Lives -  At home  Mobility -  Walks by self   Family History :     Family History  Problem Relation Age of Onset  . Hypertension Mother   . Cirrhosis Mother   . Alcoholism Mother   . Hypertension Father   . Melanoma Father   . Hypertension Other   . Coronary artery disease Other       Home Medications:   Prior to Admission medications   Medication Sig Start Date End Date Taking? Authorizing Provider  acetaminophen (TYLENOL) 500 MG tablet Take 1,000 mg every 6 (six) hours as needed by mouth for moderate pain.   Yes [provider]  Cholecalciferol (VITAMIN D PO) Take 1 tablet daily by mouth. Strength unknown   Yes [provider]     Allergies:     Allergies  Allergen Reactions  . Robaxin [Methocarbamol] Other (See Comments)    "jumpy limbs"  . Shellfish-Derived Products Nausea And Vomiting  . Trazodone And Nefazodone Other (See Comments)    Reaction:  Muscle spasms  . Contrast Media [Iodinated Diagnostic Agents] Hives  . Reglan [Metoclopramide] Other (See Comments)    Reaction:  Muscle spasms     Physical Exam:   Vitals  Blood pressure 135/83, pulse 81, temperature 97.7 F (36.5 C), temperature source Axillary, resp. rate 16, height 5\' 10"  (1.778 m), weight 56.7 kg (125 lb 1.6 oz), SpO2 100 %.   1. General  lying in bed in NAD,    2. Normal affect and insight, Not Suicidal or Homicidal, Awake Alert, Oriented X 3.  3. No F.N deficits, ALL C.Nerves Intact, Strength 5/5 all 4 extremities, Sensation intact all 4 extremities, Plantars down going.  4. Ears and Eyes appear Normal, Conjunctivae clear, PERRLA. Moist Oral Mucosa.  5. Supple Neck, No JVD, No cervical lymphadenopathy appriciated, No Carotid Bruits.  6. Symmetrical Chest wall movement, Good air movement bilaterally, CTAB.  7. Tachycardic S1, S2  , No Gallops, Rubs or Murmurs, No Parasternal  Heave.  8. Positive Bowel Sounds, Abdomen Soft,  No tenderness, No organomegaly appriciated,No rebound -guarding or rigidity.  9.  No Cyanosis, Normal Skin Turgor, No Skin Rash or Bruise.  10. Good muscle tone,  joints appear normal , no effusions, Normal ROM.  11. No Palpable Lymph Nodes in Neck or Axillae  No palmar erythema, no asterixis   Data Review:    CBC Recent Labs  Lab 07/10/17 1931  WBC 14.8*  HGB 9.7*  HCT 29.6*  PLT 285  MCV 85.1  MCH 27.9  MCHC 32.8  RDW 16.2*  LYMPHSABS 2.5  MONOABS 0.9  EOSABS 0.4  BASOSABS 0.0   ------------------------------------------------------------------------------------------------------------------  Chemistries  Recent Labs  Lab 07/10/17 1931  NA 136  K 3.1*  CL 101  CO2 21*  GLUCOSE 114*  BUN 6  CREATININE 0.64  CALCIUM 8.7*  AST 24  ALT 27  ALKPHOS 88  BILITOT 0.5   ------------------------------------------------------------------------------------------------------------------ estimated creatinine clearance is 90.6 mL/min (by C-G formula based on SCr of 0.64 mg/dL). ------------------------------------------------------------------------------------------------------------------ No results for input(s): TSH, T4TOTAL, T3FREE, THYROIDAB in the last 72 hours.  Invalid input(s): FREET3  Coagulation profile No results for input(s): INR, PROTIME in the last 168 hours. ------------------------------------------------------------------------------------------------------------------- No results for input(s): DDIMER in the last 72 hours. -------------------------------------------------------------------------------------------------------------------  Cardiac Enzymes No results for input(s): CKMB, TROPONINI, MYOGLOBIN in the last 168 hours.  Invalid input(s): CK ------------------------------------------------------------------------------------------------------------------    Component Value Date/Time   BNP  52.9 07/03/2016 1314     ---------------------------------------------------------------------------------------------------------------  Urinalysis    Component Value Date/Time   COLORURINE YELLOW 07/07/2017 0504   APPEARANCEUR CLEAR 07/07/2017 0504   LABSPEC 1.013 07/07/2017 0504   PHURINE 5.0 07/07/2017 0504   GLUCOSEU NEGATIVE 07/07/2017 0504   HGBUR NEGATIVE 07/07/2017 0504   HGBUR negative 04/30/2010 1020   BILIRUBINUR NEGATIVE 07/07/2017 0504   KETONESUR NEGATIVE 07/07/2017 0504   PROTEINUR NEGATIVE 07/07/2017 0504   UROBILINOGEN 0.2 03/15/2015 0508   NITRITE NEGATIVE 07/07/2017 0504   LEUKOCYTESUR NEGATIVE 07/07/2017 0504    ----------------------------------------------------------------------------------------------------------------   Imaging Results:    No results found.    Assessment & Plan:    Active Problems:   * No active hospital problems. *   ETOH Pancreatitis NPO Hydrate with NS iv Dilaudid 1mg  iv q3h  Pancreatic pseudocyst Encouraged to STOP ETOH use  Tachycardia ? etoh withdrawal vs pain Tele Trop I q6h x3 If persistent consider TSH and cardiac echo  N/v Zofran 4mg  iv q6h prn Protonix 40mg  po bid  Diarrhea Minimal , probably due to etoh withdrawal  Tobacco use Pt counselled on smoking cessation x 3 minutes Nicotine patch  Etoh withdrawal Librium 50mg  po qday x 1 day then 25mg  po qday x 1 day CIWA    DVT Prophylaxis Lovenox - SCDs  AM Labs Ordered, also please review Full Orders  Family Communication: Admission, patients condition and plan of care including tests being ordered have been discussed with the patient  who indicate understanding and agree with the plan and Code Status.  Code Status FULL CODE  Likely DC to  home  Condition GUARDED    Consults called: none  Admission status: inpatient    Time spent in minutes : 45   Identity Chart Correction Analyst Three M.D on 07/14/2017 at 3:01 PM  Between 7am to  7pm - Pager - [phone]  . After 7pm go to [email or web address] - password [facility]  [facility] - Office  [phone]

## 2017-03-18 NOTE — ED Triage Notes (Addendum)
Pt transported from home by [facility], pt waiting in driveway, ambulatory c/o LUQ abd pain. Pt reports hx of pancreatitis.  Pt requesting IV by EMS for pain and nausea meds

## 2017-03-18 NOTE — ED Notes (Signed)
Faxed labs to [facility] per their request.

## 2017-03-18 NOTE — ED Provider Notes (Signed)
Care was taken over from Dr. Stark Jock.  Patient has recurrent pancreatitis related to alcohol use.  His CT scan shows no acute abnormalities.  He does have a mild adynamic ileus.  No other signs of infection.  He has no ongoing vomiting.  He is tolerating oral fluids without vomiting.  He was discharged home in good condition.  He has some mild tachycardia which appears to be fairly consistent with him.  He has an appointment to follow-up with all of our gastroenterology next month.  He will follow-up with his primary care provider for ongoing treatment.  Return precautions were given.   Malvin Johns, MD 03/18/17 [phone]

## 2017-03-18 NOTE — ED Triage Notes (Signed)
Pt reports abd pain and vomiting since 9am.  Pt also reports having loose stools.  Pt states he currently has nausea.  Pt admits to drinking beer last night.  Reports vomiting x 2.  Pt a/o x 4 and ambulatory.

## 2017-03-18 NOTE — ED Notes (Signed)
Pt to CT via stretcher

## 2017-03-18 NOTE — ED Provider Notes (Signed)
[facility]PART[facility] Provider Note   CSN: [id number] Arrival date & time: 03/18/17  0865     History   Chief Complaint Chief Complaint  Patient presents with  . Abdominal Pain    HPI [name] is a 46 y.o. male.  Patient is a 46 year old male well-known to the emergency department for chronic alcoholism and alcohol related pancreatitis.  He presents today for evaluation of epigastric pain.  This started this evening after eating spicy chicken wings and drinking beer.  He denies any vomiting but does feel nauseated.  He denies any bloody stool.   The history is provided by the patient.  Abdominal Pain   This is a recurrent problem. The current episode started less than 1 hour ago. The problem occurs constantly. The problem has not changed since onset.The pain is associated with eating. The pain is located in the epigastric region. Associated symptoms include nausea. Pertinent negatives include fever, melena, vomiting and constipation. The symptoms are aggravated by palpation and drinking alcohol. Nothing relieves the symptoms.    Past Medical History:  Diagnosis Date  . Alcoholism /alcohol abuse ([place])   . Anemia   . Anxiety   . Arthritis    "knees; arms; elbows" (03/26/2015)  . Asthma   . Bipolar disorder ([place])   . Chronic bronchitis ([place])   . Chronic lower back pain   . Cocaine abuse ([place])   . Depression   . Family history of adverse reaction to anesthesia    "grandmother gets confused"  . Femoral condyle fracture ([place]) 03/08/2014   left medial/notes 03/09/2014  . GERD (gastroesophageal reflux disease)   . H/O hiatal hernia   . H/O suicide attempt 10/2012  . Heart murmur    "when he was little" (03/06/2013)  . High cholesterol   . History of blood transfusion 10/2012   "when I tried to commit suicide"  . History of stomach ulcers   . Hypertension   . Marijuana abuse, continuous   . Migraine    "a few times/year"  (03/26/2015)  . Pancreatitis   . Pneumonia 1990's X 3  . PTSD (post-traumatic stress disorder)   . Shortness of breath    "can happen at anytime" (03/06/2013)  . Sickle cell trait ([place])   . WPW (Wolff-Parkinson-White syndrome)    [name] 03/06/2013    Patient Active Problem List   Diagnosis Date Noted  . Acute on chronic pancreatitis ([place]) 12/17/2016  . Chronic alcoholic pancreatitis ([place]) 12/05/2016  . Intractable nausea and vomiting 12/05/2016  . Verbally abusive behavior 12/05/2016  . Normocytic anemia 12/05/2016  . Alcohol use disorder, severe, dependence ([place]) 07/25/2016  . Cocaine use disorder, severe, dependence ([place]) 07/25/2016  . 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 Leukocytosis   . Hospital acquired PNA 05/20/2015  . Pancreatitis 05/18/2015  . Pseudocyst of pancreas 05/18/2015  . Polysubstance abuse (tobacco, cocaine, THC, and ETOH) 03/26/2015  . Alcohol-induced chronic pancreatitis ([place])   . Benign essential HTN 02/06/2014  . Alcohol-induced acute pancreatitis 11/28/2013  . Pancreatic pseudocyst/cyst 11/25/2013  . MDD (major depressive disorder), recurrent severe, without psychosis ([place]) 10/22/2013  . Severe protein-calorie malnutrition ([place]) 10/10/2013  . Suicide attempt ([place]) 10/08/2013  . 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 TOBACCO ABUSE 03/23/2007    Past Surgical History:  Procedure Laterality Date  . CARDIAC CATHETERIZATION    . EYE SURGERY Left 1990's   "result of trauma"   .  FACIAL FRACTURE SURGERY Left 1990's   "result of trauma"   . FRACTURE SURGERY    . HERNIA REPAIR    . UMBILICAL HERNIA REPAIR         Home Medications    Prior to Admission medications   Medication Sig Start Date End Date Taking? Authorizing Provider  albuterol (PROVENTIL HFA;VENTOLIN HFA) 108 (90 Base) MCG/ACT inhaler Inhale 2 puffs into the lungs every 6 (six) hours as needed for  wheezing or shortness of breath. 07/25/16   Lindell Spar I, NP  Alum & Mag Hydroxide-Simeth (GI COCKTAIL) SUSP suspension Take 30 mLs by mouth 2 (two) times daily as needed for indigestion. Shake well. 03/05/17   Doristine Devoid, PA-C  cholestyramine (QUESTRAN) 4 g packet Take 1 packet (4 g total) by mouth 3 (three) times daily as needed (diarrhea). 07/25/16   Lindell Spar I, NP  dicyclomine (BENTYL) 20 MG tablet Take 1 tablet (20 mg total) by mouth 2 (two) times daily. 03/05/17   Doristine Devoid, PA-C  fluticasone (FLONASE) 50 MCG/ACT nasal spray Place 1 spray into both nostrils daily as needed for rhinitis. 07/25/16   Lindell Spar I, NP  folic acid (FOLVITE) 1 MG tablet Take 1 tablet (1 mg total) by mouth daily. Patient not taking: Reported on 02/12/2017 01/20/17   Georgette Shell, MD  gabapentin (NEURONTIN) 300 MG capsule Take 1 capsule (300 mg total) by mouth 3 (three) times daily. For agitation 07/25/16   Lindell Spar I, NP  hydrALAZINE (APRESOLINE) 25 MG tablet Take 1 tablet (25 mg total) by mouth every 8 (eight) hours. Patient taking differently: Take 25 mg by mouth at bedtime.  12/29/16   Barton Dubois, MD  lipase/protease/amylase (CREON) 12000 units CPEP capsule Take 2 capsules (24,000 Units total) by mouth 3 (three) times daily with meals. For pancreatitis 12/29/16   Barton Dubois, MD  loperamide (IMODIUM) 2 MG capsule Take 1 capsule (2 mg total) by mouth as needed for diarrhea or loose stools. 01/19/17   Georgette Shell, MD  loratadine (CLARITIN) 10 MG tablet Take 1 tablet (10 mg total) by mouth daily. (May purchase from over the counter): For allergies 07/26/16   Lindell Spar I, NP  metoprolol tartrate (LOPRESSOR) 25 MG tablet Take 1 tablet (25 mg total) by mouth 2 (two) times daily. For high blood pressure 12/29/16   Barton Dubois, MD  Multiple Vitamin (MULTIVITAMIN WITH MINERALS) TABS tablet Take 1 tablet by mouth daily. 01/20/17   Georgette Shell, MD  omeprazole (PRILOSEC)  20 MG capsule Take 20 mg by mouth daily.    [provider]  ondansetron (ZOFRAN) 4 MG tablet Take 1 tablet (4 mg total) by mouth every 6 (six) hours. Patient taking differently: Take 4 mg every 8 (eight) hours as needed by mouth for nausea.  03/05/17   Doristine Devoid, PA-C  Oxycodone HCl 10 MG TABS Take 1 tablet (10 mg total) by mouth every 4 (four) hours as needed. Patient taking differently: Take 10 mg by mouth every 4 (four) hours as needed.  01/25/17   Julianne Rice, MD  pantoprazole (PROTONIX) 40 MG tablet Take 1 tablet (40 mg total) by mouth daily. 12/29/16   Barton Dubois, MD  QUEtiapine (SEROQUEL) 25 MG tablet Take 25 mg by mouth at bedtime.    [provider]  sertraline (ZOLOFT) 25 MG tablet Take 3 tablets (75 mg total) by mouth daily. For depression 07/26/16   Lindell Spar I, NP  tetrahydrozoline 0.05 % ophthalmic  solution Place 1 drop into both eyes daily as needed (itching).    [provider]  thiamine 100 MG tablet Take 1 tablet (100 mg total) by mouth daily. Patient not taking: Reported on 02/12/2017 12/30/16   Barton Dubois, MD    Family History Family History  Problem Relation Age of Onset  . Hypertension Mother   . Hypertension Father   . Hypertension Other   . Coronary artery disease Other     Social History Social History   Tobacco Use  . Smoking status: Current Every Day Smoker    Packs/day: 1.00    Types: Cigarettes  . Smokeless tobacco: Never Used  Substance Use Topics  . Alcohol use: Yes  . Drug use: Yes    Types: Marijuana, Cocaine     Allergies   Robaxin [methocarbamol]; Shellfish-derived products; Trazodone and nefazodone; Contrast media [iodinated diagnostic agents]; and Reglan [metoclopramide]   Review of Systems Review of Systems  Constitutional: Negative for fever.  Gastrointestinal: Positive for abdominal pain and nausea. Negative for constipation, melena and vomiting.  All other systems reviewed and are  negative.    Physical Exam Updated Vital Signs There were no vitals taken for this visit.  Physical Exam  Constitutional: He is oriented to person, place, and time. He appears well-developed and well-nourished. No distress.  HENT:  Head: Normocephalic and atraumatic.  Mouth/Throat: Oropharynx is clear and moist.  Neck: Normal range of motion. Neck supple.  Cardiovascular: Normal rate and regular rhythm. Exam reveals no friction rub.  No murmur heard. Pulmonary/Chest: Effort normal and breath sounds normal. No respiratory distress. He has no wheezes. He has no rales.  Abdominal: Soft. Bowel sounds are normal. He exhibits no distension. There is tenderness in the epigastric area. There is no rigidity, no rebound and no guarding.  There is tenderness to palpation in the epigastric region.  Musculoskeletal: Normal range of motion. He exhibits no edema.  Neurological: He is alert and oriented to person, place, and time. Coordination normal.  Skin: Skin is warm and dry. He is not diaphoretic.  Nursing note and vitals reviewed.    ED Treatments / Results  Labs (all labs ordered are listed, but only abnormal results are displayed) Labs Reviewed  COMPREHENSIVE METABOLIC PANEL  ETHANOL  LIPASE, BLOOD  CBC WITH DIFFERENTIAL/PLATELET    EKG  EKG Interpretation None       Radiology No results found.  Procedures Procedures (including critical care time)  Medications Ordered in ED Medications  ketorolac (TORADOL) 30 MG/ML injection 30 mg (not administered)  sodium chloride 0.9 % bolus 1,000 mL (not administered)  gi cocktail (Maalox,Lidocaine,Donnatal) (not administered)  ondansetron (ZOFRAN) injection 4 mg (not administered)     Initial Impression / Assessment and Plan / ED Course  I have reviewed the triage vital signs and the nursing notes.  Pertinent labs & imaging results that were available during my care of the patient were reviewed by me and considered in my  medical decision making (see chart for details).  Patient with history of chronic abdominal pain related to chronic alcohol induced pancreatitis.  He presents with severe epigastric pain.  His lipase is 55.  His white count this evening is 20.6, which is significantly elevated from prior studies.  For this reason, he will undergo a CT scan to rule out acute intra-abdominal pathology.  He was given IV fluids, Zofran, a GI cocktail, and Toradol.  Care will be signed out to the oncoming provider at shift change.  Final Clinical Impressions(s) / ED Diagnoses   Final diagnoses:  None    ED Discharge Orders    None       Veryl Speak, MD 03/18/17 [phone]

## 2017-03-18 NOTE — ED Provider Notes (Addendum)
[facility] Provider Note   CSN: [id number] Arrival date & time: 03/18/17  1327     History   Chief Complaint Chief Complaint  Patient presents with  . Abdominal Pain    HPI [name] is a 46 y.o. male critical who presents today with chief complaint acute onset, progressively worsening left-sided abdominal pain.  He states that last night he drank approximately "1 L of wine and three 40s" while he was entertaining guests.  He states that he typically drinks 2 beers daily.  He awoke at around 8 AM this morning with sharp stabbing left-sided abdominal pain which now radiates to his back.  Pain worsens after eating and with laying flat on his back.  He has had multiple episodes of nonbloody nonbilious emesis and 2-3 episodes of watery nonbloody stools.  He has not been able to keep any food down today.  Has tried his mother's oxycodone without significant relief of his symptoms.  Endorses subjective fevers and chills, denies melena, hematochezia, shortness of breath, or chest pain.  No known sick contacts, no suspicious food intake, no recent treatment with antibiotics.  The history is provided by the patient.    Past Medical History:  Diagnosis Date  . Alcoholism /alcohol abuse ([place])   . Anemia   . Anxiety   . Arthritis    "knees; arms; elbows" (03/26/2015)  . Asthma   . Bipolar disorder ([place])   . Chronic bronchitis ([place])   . Chronic lower back pain   . Cocaine abuse ([place])   . Depression   . Family history of adverse reaction to anesthesia    "grandmother gets confused"  . Femoral condyle fracture ([place]) 03/08/2014   left medial/notes 03/09/2014  . GERD (gastroesophageal reflux disease)   . H/O hiatal hernia   . H/O suicide attempt 10/2012  . Heart murmur    "when he was little" (03/06/2013)  . High cholesterol   . History of blood transfusion 10/2012   "when I tried to commit suicide"  . History of stomach ulcers   .  Hypertension   . Marijuana abuse, continuous   . Migraine    "a few times/year" (03/26/2015)  . Pancreatitis   . Pneumonia 1990's X 3  . PTSD (post-traumatic stress disorder)   . Shortness of breath    "can happen at anytime" (03/06/2013)  . Sickle cell trait ([place])   . WPW (Wolff-Parkinson-White syndrome)    [name] 03/06/2013    Patient Active Problem List   Diagnosis Date Noted  . Abdominal pain 05/27/2017  . Hematemesis 05/27/2017  . Tachycardia 03/18/2017  . Nausea & vomiting 03/18/2017  . Diarrhea 03/18/2017  . Acute on chronic pancreatitis ([place]) 12/17/2016  . Chronic alcoholic pancreatitis ([place]) 12/05/2016  . Intractable nausea and vomiting 12/05/2016  . Verbally abusive behavior 12/05/2016  . Normocytic anemia 12/05/2016  . Alcohol use disorder, severe, dependence ([place]) 07/25/2016  . Cocaine use disorder, severe, dependence ([place]) 07/25/2016  . Major depressive disorder, recurrent severe without psychotic features ([place]) 07/20/2016  . Leukocytosis   . Hospital acquired PNA 05/20/2015  . Pancreatitis 05/18/2015  . Pseudocyst of pancreas 05/18/2015  . Polysubstance abuse (tobacco, cocaine, THC, and ETOH) 03/26/2015  . Alcohol-induced chronic pancreatitis ([place])   . Benign essential HTN 02/06/2014  . Alcohol-induced acute pancreatitis 11/28/2013  . Pancreatic pseudocyst/cyst 11/25/2013  . MDD (major depressive disorder), recurrent severe, without psychosis ([place]) 10/22/2013  . Severe protein-calorie malnutrition ([place]) 10/10/2013  . Suicide  attempt ([place]) 10/08/2013  . 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 TOBACCO ABUSE 03/23/2007    Past Surgical History:  Procedure Laterality Date  . EYE SURGERY         Home Medications    Prior to Admission medications   Medication Sig Start Date End Date Taking? Authorizing Provider  albuterol (PROVENTIL HFA;VENTOLIN HFA) 108 (90 Base) MCG/ACT inhaler Inhale 2 puffs into the lungs every 6 (six)  hours as needed for wheezing or shortness of breath. 07/25/16   Lindell Spar I, NP  dicyclomine (BENTYL) 20 MG tablet Take 1 tablet (20 mg total) by mouth 2 (two) times daily. 06/14/17   Palumbo, April, MD  famotidine (PEPCID) 20 MG tablet Take 1 tablet (20 mg total) by mouth 2 (two) times daily. 07/05/17   Sherwood Gambler, MD  gabapentin (NEURONTIN) 300 MG capsule Take 1 capsule (300 mg total) by mouth 3 (three) times daily. For agitation Patient taking differently: Take 300 mg by mouth 2 (two) times daily.  07/25/16   Lindell Spar I, NP  hydrALAZINE (APRESOLINE) 25 MG tablet Take 1 tablet (25 mg total) by mouth every 8 (eight) hours. Patient taking differently: Take 25 mg by mouth at bedtime.  12/29/16   Barton Dubois, MD  lipase/protease/amylase (CREON) 12000 units CPEP capsule Take 2 capsules (24,000 Units total) by mouth 3 (three) times daily with meals. For pancreatitis 06/06/17   [name], MD  loratadine (CLARITIN) 10 MG tablet Take 1 tablet (10 mg total) by mouth daily. (May purchase from over the counter): For allergies 07/26/16   Lindell Spar I, NP  Menthol, Topical Analgesic, (BENGAY EX) Apply 1 application topically 3 (three) times daily as needed (joint pain).    [provider]  metoprolol tartrate (LOPRESSOR) 25 MG tablet Take 1 tablet (25 mg total) by mouth 2 (two) times daily. For high blood pressure 12/29/16   Barton Dubois, MD  ondansetron (ZOFRAN) 4 MG tablet Take 1 tablet (4 mg total) by mouth every 6 (six) hours. 07/06/17   Hedges, Dellis Filbert, PA-C  oxyCODONE (ROXICODONE) 5 MG immediate release tablet Take 1 tablet (5 mg total) by mouth every 8 (eight) hours as needed. 05/30/17 05/30/18  Georgette Shell, MD  pantoprazole (PROTONIX) 40 MG tablet Take 1 tablet (40 mg total) by mouth daily. 06/09/17   [name], MD  Polyethyl Glycol-Propyl Glycol (SYSTANE OP) Place 1 drop into both eyes daily.    [provider]  sertraline (ZOLOFT) 25 MG tablet Take 3 tablets (75  mg total) by mouth daily. For depression 07/26/16   Lindell Spar I, NP  sucralfate (CARAFATE) 1 g tablet Take 1 tablet (1 g total) by mouth 4 (four) times daily -  with meals and at bedtime. 07/10/17   Kirichenko, Lahoma Rocker, PA-C  thiamine 100 MG tablet Take 1 tablet (100 mg total) by mouth daily. 12/30/16   Barton Dubois, MD  omeprazole (PRILOSEC) 20 MG capsule Take 1 capsule (20 mg total) daily by mouth. 03/22/17 06/21/17  Varney Biles, MD    Family History Family History  Problem Relation Age of Onset  . Hypertension Mother   . Cirrhosis Mother   . Alcoholism Mother   . Hypertension Father   . Melanoma Father   . Hypertension Other   . Coronary artery disease Other     Social History Social History   Tobacco Use  . Smoking status: Current Every Day Smoker    Packs/day: 1.00    Types: Cigarettes,  E-cigarettes  . Smokeless tobacco: Never Used  Substance Use Topics  . Alcohol use: Yes    Comment: last drink 07/01/17  . Drug use: Yes    Types: Marijuana     Allergies   Robaxin [methocarbamol]; Shellfish-derived products; Trazodone and nefazodone; Contrast media [iodinated diagnostic agents]; and Reglan [metoclopramide]   Review of Systems Review of Systems  Constitutional: Positive for chills and fever.  Respiratory: Negative for shortness of breath.   Cardiovascular: Negative for chest pain.  Gastrointestinal: Positive for abdominal pain, diarrhea, nausea and vomiting. Negative for blood in stool and constipation.  Genitourinary: Positive for flank pain. Negative for dysuria, frequency, hematuria and urgency.  All other systems reviewed and are negative.    Physical Exam Updated Vital Signs BP 135/83 (BP Location: Left Arm)   Pulse 81   Temp 97.7 F (36.5 C) (Axillary)   Resp 16   Ht 5\' 10"  (1.778 m)   Wt 56.7 kg (125 lb 1.6 oz)   SpO2 100%   BMI 17.95 kg/m   Physical Exam  Constitutional: He appears well-developed and well-nourished. No distress.   Uncomfortable in appearance, clutching his abdomen  HENT:  Head: Normocephalic and atraumatic.  Eyes: Conjunctivae are normal. Right eye exhibits no discharge. Left eye exhibits no discharge.  Neck: No JVD present. No tracheal deviation present.  Cardiovascular: Normal rate, regular rhythm and normal heart sounds.  Pulmonary/Chest: Effort normal and breath sounds normal.  Abdominal: Soft. Bowel sounds are normal. He exhibits no distension. There is tenderness in the left upper quadrant and left lower quadrant. There is guarding and CVA tenderness. There is no tenderness at McBurney's point and negative Murphy's sign.  Maximally tender to palpation in the left upper quadrant with left lower quadrant tenderness and left CVA tenderness.  Musculoskeletal: He exhibits no edema.  Neurological: He is alert.  Skin: Skin is warm and dry. No erythema.  Psychiatric: He has a normal mood and affect. His behavior is normal.  Nursing note and vitals reviewed.    ED Treatments / Results  Labs (all labs ordered are listed, but only abnormal results are displayed) Labs Reviewed  LIPASE, BLOOD - Abnormal; Notable for the following components:      Result Value   Lipase 68 (*)    All other components within normal limits  COMPREHENSIVE METABOLIC PANEL - Abnormal; Notable for the following components:   CO2 17 (*)    Glucose, Bld 63 (*)    Calcium 8.8 (*)    All other components within normal limits  CBC - Abnormal; Notable for the following components:   WBC 12.2 (*)    RBC 3.97 (*)    Hemoglobin 11.5 (*)    HCT 35.4 (*)    RDW 19.9 (*)    All other components within normal limits  URINALYSIS, ROUTINE W REFLEX MICROSCOPIC - Abnormal; Notable for the following components:   Ketones, ur 5 (*)    All other components within normal limits  COMPREHENSIVE METABOLIC PANEL - Abnormal; Notable for the following components:   CO2 21 (*)    BUN 5 (*)    Creatinine, Ser 0.58 (*)    Calcium 8.2 (*)     Total Protein 5.7 (*)    Albumin 2.7 (*)    All other components within normal limits  CBC - Abnormal; Notable for the following components:   RBC 3.03 (*)    Hemoglobin 8.8 (*)    HCT 27.2 (*)    RDW 20.1 (*)  All other components within normal limits  LACTATE DEHYDROGENASE - Abnormal; Notable for the following components:   LDH 86 (*)    All other components within normal limits  FERRITIN - Abnormal; Notable for the following components:   Ferritin 23 (*)    All other components within normal limits  RETICULOCYTES - Abnormal; Notable for the following components:   RBC. 3.69 (*)    All other components within normal limits  HIV ANTIBODY (ROUTINE TESTING)  LIPID PANEL  TROPONIN I  TROPONIN I  TROPONIN I  LIPASE, BLOOD  VITAMIN B12  FOLATE  IRON AND TIBC    EKG  EKG Interpretation None       Radiology No results found.  Procedures Procedures (including critical care time)  Medications Ordered in ED Medications  ondansetron (ZOFRAN-ODT) disintegrating tablet 4 mg (4 mg Oral Given 03/18/17 1439)  sodium chloride 0.9 % bolus 1,000 mL (0 mLs Intravenous Stopped 03/18/17 1638)  morphine 4 MG/ML injection 4 mg (4 mg Intravenous Given 03/18/17 1608)  ondansetron (ZOFRAN) injection 4 mg (4 mg Intravenous Given 03/18/17 1608)  iopamidol (ISOVUE-300) 61 % injection (100 mLs Intravenous Contrast Given 03/18/17 1623)  HYDROmorphone (DILAUDID) injection 1 mg (1 mg Intravenous Given 03/18/17 1730)  sucralfate (CARAFATE) tablet 1 g (1 g Oral Given 03/18/17 1831)  famotidine (PEPCID) IVPB 20 mg premix (0 mg Intravenous Stopped 03/18/17 1914)  gi cocktail (Maalox,Lidocaine,Donnatal) (30 mLs Oral Given 03/18/17 1830)  promethazine (PHENERGAN) injection 25 mg (25 mg Intravenous Given 03/18/17 1831)  sodium chloride 0.9 % bolus 1,000 mL (0 mLs Intravenous Stopped 03/18/17 1914)  LORazepam (ATIVAN) injection 1 mg (1 mg Intravenous Given 03/18/17 1924)  ondansetron (ZOFRAN)  injection 4 mg (4 mg Intravenous Given 03/18/17 2044)  HYDROmorphone (DILAUDID) injection 1 mg (1 mg Intravenous Given 03/18/17 2044)  chlordiazePOXIDE (LIBRIUM) capsule 50 mg (50 mg Oral Given 03/18/17 2315)  HYDROmorphone (DILAUDID) injection 1 mg (1 mg Intravenous Given 03/20/17 0526)     Initial Impression / Assessment and Plan / ED Course  I have reviewed the triage vital signs and the nursing notes.  Pertinent labs & imaging results that were available during my care of the patient were reviewed by me and considered in my medical decision making (see chart for details).     Patient presents with severe abdominal pain which began earlier today with associated nausea, vomiting, and diarrhea.  Afebrile, persistently tachycardic and hypertensive while in the ED.  He initially stated that he typically drinks 2 beers daily but later states "I have been drinking since I was 14 "and shows me a card from appear health specialist that he was given to help him seek treatment for alcoholism.  He has a leukocytosis of 12.2, lipase of 68, CMP unremarkable.  CT scan of the abdomen and pelvis reviewed by me shows findings consistent with mild pancreatitis as well as fluid collections consistent with pseudocysts.  While in the ED, he received multiple rounds of nausea medication, pain medication, and fluids but was unable to tolerate p.o. fluids.  Spoke with Dr. Maudie Mercury hospitalist who agrees to assume care of patient and bring him into the hospital for further management and evaluation of his intractable nausea and vomiting. Final Clinical Impressions(s) / ED Diagnoses   Final diagnoses:  Intractable cyclical vomiting with nausea    ED Discharge Orders    None       Debroah Baller 03/19/17 0118    Virgel Manifold, MD 03/21/17 1238  Renita Papa, PA-C 07/15/17 0719    Virgel Manifold, MD 07/20/17 [phone]

## 2017-03-19 DIAGNOSIS — G43A1 Cyclical vomiting, intractable: Secondary | ICD-10-CM

## 2017-03-19 LAB — TROPONIN I: Troponin I: <0.03 ng/mL (ref <0.03)

## 2017-03-19 LAB — COMPREHENSIVE METABOLIC PANEL
ALK PHOS: 65 U/L (ref 38–126)
ALT: 18 U/L (ref 17–63)
AST: 22 U/L (ref 15–41)
Albumin: 2.7 g/dL — ABNORMAL LOW (ref 3.5–5.0)
Anion gap: 6 (ref 5–15)
BILIRUBIN TOTAL: 0.3 mg/dL (ref 0.3–1.2)
BUN: 5 mg/dL — AB (ref 6–20)
CALCIUM: 8.2 mg/dL — AB (ref 8.9–10.3)
CO2: 21 mmol/L — ABNORMAL LOW (ref 22–32)
CREATININE: 0.58 mg/dL — AB (ref 0.61–1.24)
Chloride: 110 mmol/L (ref 101–111)
Glucose, Bld: 76 mg/dL (ref 65–99)
Potassium: 3.6 mmol/L (ref 3.5–5.1)
Sodium: 137 mmol/L (ref 135–145)
TOTAL PROTEIN: 5.7 g/dL — AB (ref 6.5–8.1)

## 2017-03-19 LAB — LIPID PANEL
CHOL/HDL RATIO: 2.3 ratio
CHOLESTEROL: 112 mg/dL (ref 0–200)
HDL: 49 mg/dL (ref >40)
LDL Cholesterol: 38 mg/dL (ref 0–99)
Triglycerides: 124 mg/dL (ref <150)
VLDL: 25 mg/dL (ref 0–40)

## 2017-03-19 LAB — FOLATE: Folate: 16.6 ng/mL (ref >5.9)

## 2017-03-19 LAB — RETICULOCYTES
RBC.: 3.69 MIL/uL — AB (ref 4.22–5.81)
RETIC CT PCT: 1.6 % (ref 0.4–3.1)
Retic Count, Absolute: 59 10*3/uL (ref 19.0–186.0)

## 2017-03-19 LAB — CBC
HCT: 27.2 % — ABNORMAL LOW (ref 39.0–52.0)
Hemoglobin: 8.8 g/dL — ABNORMAL LOW (ref 13.0–17.0)
MCH: 29 pg (ref 26.0–34.0)
MCHC: 32.4 g/dL (ref 30.0–36.0)
MCV: 89.8 fL (ref 78.0–100.0)
PLATELETS: 276 10*3/uL (ref 150–400)
RBC: 3.03 MIL/uL — AB (ref 4.22–5.81)
RDW: 20.1 % — AB (ref 11.5–15.5)
WBC: 8.3 10*3/uL (ref 4.0–10.5)

## 2017-03-19 LAB — IRON AND TIBC
Iron: 64 ug/dL (ref 45–182)
Saturation Ratios: 19 % (ref 17.9–39.5)
TIBC: 330 ug/dL (ref 250–450)
UIBC: 266 ug/dL

## 2017-03-19 LAB — VITAMIN B12: VITAMIN B 12: 241 pg/mL (ref 180–914)

## 2017-03-19 LAB — LIPASE, BLOOD: LIPASE: 27 U/L (ref 11–51)

## 2017-03-19 LAB — FERRITIN: Ferritin: 23 ng/mL — ABNORMAL LOW (ref 24–336)

## 2017-03-19 LAB — LACTATE DEHYDROGENASE: LDH: 86 U/L — AB (ref 98–192)

## 2017-03-19 MED ORDER — GI COCKTAIL ~~LOC~~
30.0000 mL | Freq: Two times a day (BID) | ORAL | Status: DC | PRN
  Administered 2017-03-19 – 2017-03-20 (×3): 30 mL via ORAL
  Filled 2017-03-19 (×3): qty 30

## 2017-03-19 MED ORDER — TRAMADOL HCL 50 MG PO TABS: 50.0000 mg | ORAL_TABLET | Freq: Four times a day (QID) | ORAL | Status: DC | PRN

## 2017-03-19 MED ORDER — HYDROMORPHONE HCL 1 MG/ML IJ SOLN
1.0000 mg | INTRAMUSCULAR | Status: DC | PRN
  Administered 2017-03-19 – 2017-03-20 (×5): 1 mg via INTRAVENOUS
  Filled 2017-03-19 (×5): qty 1

## 2017-03-19 NOTE — Progress Notes (Signed)
   03/19/17 2200  Clinical Encounter Type  Visited With Patient;Health care provider  Visit Type Initial;Spiritual support;Psychological support  Referral From Patient  Consult/Referral To Chaplain  Spiritual Encounters  Spiritual Needs Emotional  Stress Factors  Patient Stress Factors Financial concerns;Major life changes;Loss of control;Lack of knowledge;Family relationships  Family Stress Factors None identified   Chaplain was called to speak with Pt at the Pt request. Pt wanted information on rehab 30/60/90 day programs. Chaplain asked RN to put in a Education officer, museum consult. Chaplain told Pt to follow up with [place] if there isn't any response. Chaplain provided empathic listening and prayer.

## 2017-03-19 NOTE — Progress Notes (Signed)
Patient is refusing to allow staff to turn on bed alarm. Educated on safety and risk of falling while taking IV pain medication and Ativan. Verbalized understanding and still refused bed alarm.

## 2017-03-19 NOTE — Progress Notes (Addendum)
PROGRESS NOTE    [name]  NFA:[id number] DOB: 1970/12/23 DOA: 03/18/2017 PCP: Marliss Coots, NP    Brief Narrative: [name]  is a 46 y.o. male, w hypertension, etoh abuse, comes in for abdominal pain. Was found to have acute pancreatitis from ETOH use.    Assessment & Plan:   Active Problems:   * No active hospital problems. *   Acute Pancreatitis:  Possibly etoh abuse.  Was NPO until this am, IV fluids, IV pain control and IV anti emetics.  Start clears and monitor for symptoms. Lipase has improved. Lipid panel wnl.    ETOH abuse:  On CIWA protocol.  No signs of withdrawal. On folate and thiamine.    Normocytic anemia:  Hemoglobin stable around 8.  Get anemia panel.   Nausea, vomiting and diarrhea: improved.    Tobacco abuse:  On nicotine patch.     DVT prophylaxis: lovenox.  Code Status: full code.  Family Communication: none at bedside Disposition Plan: possibly home when symptoms improve.    Consultants:   None.    Procedures: (none.   Antimicrobials: none.      Subjective: abd pain persistent, but better than yesterday.   Objective: Vitals:   03/19/17 0928 03/19/17 1347 03/19/17 2130 03/20/17 0522  BP:  130/89 133/84 135/83  Pulse:  92 94 81  Resp:  18 18 16   Temp:  (!) 97.3 F (36.3 C) 97.6 F (36.4 C) 97.7 F (36.5 C)  TempSrc:  Oral Oral Axillary  SpO2: 97% 100% 94% 100%  Weight:    56.7 kg (125 lb 1.6 oz)  Height:       No intake or output data in the 24 hours ending 07/14/17 1503 Filed Weights   03/18/17 1434 03/18/17 2242 03/19/17 0545  Weight: 65.8 kg (145 lb) 57.9 kg (127 lb 11.2 oz) 56.6 kg (124 lb 11.2 oz)    Examination:  General exam: Appears calm and comfortable  Respiratory system: Clear to auscultation. Respiratory effort normal. Cardiovascular system: S1 & S2 heard, RRR. No JVD, murmurs, rubs, gallops or clicks. No pedal edema. Gastrointestinal system: Abdomen is nondistended, soft  and mildly tender. . No organomegaly or masses felt. Normal bowel sounds heard. Central nervous system: Alert and oriented. No focal neurological deficits. Extremities: Symmetric 5 x 5 power. Skin: No rashes, lesions or ulcers Psychiatry: Judgement and insight appear normal. Mood & affect appropriate.     Data Reviewed: I have personally reviewed following labs and imaging studies  CBC: Recent Labs  Lab 07/10/17 1931  WBC 14.8*  NEUTROABS 11.0*  HGB 9.7*  HCT 29.6*  MCV 85.1  PLT 469   Basic Metabolic Panel: Recent Labs  Lab 03/18/17 1447 03/19/17 0516  NA 136 137  K 3.7 3.6  CL 105 110  CO2 17* 21*  GLUCOSE 63* 76  BUN 9 5*  CREATININE 0.65 0.58*  CALCIUM 8.8* 8.2*   GFR: Estimated Creatinine Clearance: 90.6 mL/min (by C-G formula based on SCr of 0.64 mg/dL). Liver Function Tests: Recent Labs  Lab 07/10/17 1931  AST 24  ALT 27  ALKPHOS 88  BILITOT 0.5  PROT 6.5  ALBUMIN 3.4*   Recent Labs  Lab 03/18/17 1447  LIPASE 68*   No results for input(s): AMMONIA in the last 168 hours. Coagulation Profile: No results for input(s): INR, PROTIME in the last 168 hours. Cardiac Enzymes: No results for input(s): CKTOTAL, CKMB, CKMBINDEX, TROPONINI in the last 168 hours. BNP (last 3 results) No  results for input(s): PROBNP in the last 8760 hours. HbA1C: No results for input(s): HGBA1C in the last 72 hours. CBG: No results for input(s): GLUCAP in the last 168 hours. Lipid Profile: Recent Labs    03/19/17 0516  CHOL 112  HDL 49  LDLCALC 38  TRIG 124  CHOLHDL 2.3   Thyroid Function Tests: No results for input(s): TSH, T4TOTAL, FREET4, T3FREE, THYROIDAB in the last 72 hours. Anemia Panel: No results for input(s): VITAMINB12, FOLATE, FERRITIN, TIBC, IRON, RETICCTPCT in the last 72 hours. Sepsis Labs: No results for input(s): PROCALCITON, LATICACIDVEN in the last 168 hours.  No results found for this or any previous visit (from the past 240 hour(s)).        Radiology Studies: No results found.      Scheduled Meds:  Continuous Infusions: . sodium chloride 175 mL/hr at 03/19/17 0413     LOS: 1 day    Time spent: 35 minutes.     Hosie Poisson, MD [facility] Pager [phone]  If 7PM-7AM, please contact night-coverage [email or web address] Password [email or web address] 03/19/2017, 9:08 AM

## 2017-03-20 ENCOUNTER — Other Ambulatory Visit: Payer: Self-pay

## 2017-03-20 LAB — HIV ANTIBODY (ROUTINE TESTING W REFLEX): HIV SCREEN 4TH GENERATION: NONREACTIVE

## 2017-03-20 MED ORDER — ENSURE ENLIVE PO LIQD: 237.0000 mL | Freq: Three times a day (TID) | ORAL | Status: DC

## 2017-03-20 MED ORDER — HYDROMORPHONE HCL 1 MG/ML IJ SOLN
1.0000 mg | Freq: Once | INTRAMUSCULAR | Status: AC
  Administered 2017-03-20: 1 mg via INTRAVENOUS
  Filled 2017-03-20: qty 1

## 2017-03-20 MED ORDER — HYDROCODONE-ACETAMINOPHEN 5-325 MG PO TABS: 1.0000 | ORAL_TABLET | ORAL | Status: DC | PRN

## 2017-03-20 NOTE — Progress Notes (Signed)
Initial Nutrition Assessment  DOCUMENTATION CODES:   Underweight, Severe malnutrition in context of social or environmental circumstances  INTERVENTION:    Monitor for diet advancement/toleration  Provide MVI daily  Ensure Enlive po TID, each supplement provides 350 kcal and 20 grams of protein  NUTRITION DIAGNOSIS:   Severe Malnutrition related to social / environmental circumstances(alcohol abuse) as evidenced by 10.7 % weight loss in 1 month, moderate fat depletion, severe muscle depletion.  GOAL:   Patient will meet greater than or equal to 90% of their needs  MONITOR:   PO intake, Supplement acceptance, Weight trends, Labs, Diet advancement  REASON FOR ASSESSMENT:   Malnutrition Screening Tool    ASSESSMENT:   Pt with PMH significant for HTN and alcohol abuse. Presents this admission with acute pancreatitis from alcohol abuse.   Spoke with pt at bedside.  Reports having poor tolerance of food for the last three weeks related to abdominal pain and nausea.  Pt was consuming "softer" foods like mashed potatoes, soup, and grits because normal foods would make his pain worse. Pt tolerating soft foods this admission.  States he stopped drinking 3 weeks ago, but recently got laid off from his job. This lead to pt drinking "1L of wine and 3-4 40 oz cans of beer" last Friday 11/9.  Discussed the importance of protein intake for preservation of lean body mass and the effects of alcohol on nutrition status. Pt amendable to supplementation this hospital stay.  Records are limited in weight history. Pt reports he lost 20 lbs in 3 weeks time period. [name] UBW is 140 lb. This shows a 10.7% wt loss in 1 month. This percentage in this time frame is significant.  Nutrition-Focused physical exam completed. See results below.   Medications reviewed and include: folic acid, MVI with minerals, thiamine Labs reviewed.   NUTRITION - FOCUSED PHYSICAL EXAM:    Most Recent Value  Orbital  Region  Moderate depletion  Upper Arm Region  Moderate depletion  Thoracic and Lumbar Region  Moderate depletion  Buccal Region  Moderate depletion  Temple Region  Severe depletion  Clavicle Bone Region  Severe depletion  Clavicle and Acromion Bone Region  Severe depletion  Scapular Bone Region  Moderate depletion  Dorsal Hand  Moderate depletion  Patellar Region  Severe depletion  Anterior Thigh Region  Severe depletion  Posterior Calf Region  Severe depletion  Edema (RD Assessment)  None  Hair  Reviewed  Eyes  Reviewed  Mouth  Reviewed  Skin  Reviewed  Nails  Reviewed       Diet Order:  DIET SOFT Room service appropriate? Yes; Fluid consistency: Thin  EDUCATION NEEDS:   Education needs have been addressed  Skin:  Skin Assessment: Reviewed RN Assessment  Last BM:  03/18/17  Height:   Ht Readings from Last 1 Encounters:  03/18/17 5\' 10"  (1.778 m)    Weight:   Wt Readings from Last 1 Encounters:  03/20/17 125 lb 1.6 oz (56.7 kg)    Ideal Body Weight:  75.5 kg  BMI:  Body mass index is 17.95 kg/m.  Estimated Nutritional Needs:   Kcal:  2000-2200 kcal/day  Protein:  100-110 g/day  Fluid:  >2 L/day    Mariana Single RD, LDN Clinical Nutrition Pager # [phone]

## 2017-03-20 NOTE — Progress Notes (Signed)
Patient was verbally abusive to primary RN during bedside rounds.

## 2017-03-20 NOTE — Progress Notes (Signed)
Patient carried out plan to leave against medical advice. Pt A&Ox4, ambulatory without assist.

## 2017-03-20 NOTE — Progress Notes (Signed)
Patient is not agreeable to current plan of care related to pain management. Pt request to leave AMA. Pt refused to sign AMA documrnt. Bus pass given

## 2017-03-20 NOTE — Care Management Note (Addendum)
Case Management Note  Patient Details  Name: [name] MRN: [id number] Date of Birth: [birth year]  Subjective/Objective: 46 y/o m admitted w/Pancreatitis. From home. No pcp. [facility] chosen by patient for pcp. Patient will call on own [facility] for pcp f/u appt. No further CM needs.                  Action/Plan:d/c home.   Expected Discharge Date:                  Expected Discharge Plan:  Home/Self Care  In-House Referral:     Discharge planning Services  CM Consult, [facility]  Post Acute Care Choice:    Choice offered to:     DME Arranged:    DME Agency:     HH Arranged:    [place] Agency:     Status of Service:  Completed, signed off  If discussed at [employer] of Stay Meetings, dates discussed:    Additional Comments:  Identity Chart Correction Analyst Three 03/20/2017, 10:51 AM

## 2017-03-21 NOTE — Discharge Summary (Addendum)
Physician Discharge Summary  [name] EXH:[id number] DOB: Oct 22, 1970 DOA: 03/18/2017  PCP: Marliss Coots, NP  Admit date: 03/18/2017 Discharge date: 03/20/2017  Admitted From:HOME.  Disposition: PT LEFT AMA  Recommendations for Outpatient Follow-up:  PT LEFT AMA.    Brief/Interim Summary: RayfieldWilliamsis a51 y.o.male,w hypertension, etoh abuse, comes in for abdominal pain. Was found to have acute pancreatitis from ETOH use.     Discharge Diagnoses:  Active Problems:   * No active hospital problems. * severe protein energy malnutrition   Acute Pancreatitis:  Possibly etoh abuse.  Started with IV fluids, his abd pain improved, his nausea, vomiting resolved. Advanced his diet. Changed IV pain meds to oral.  Lipase has improved. Lipid panel wnl.    ETOH abuse:  On CIWA protocol.  No signs of withdrawal. On folate and thiamine.    Normocytic anemia:  Hemoglobin stable around 8.    Nausea, vomiting and diarrhea: improved.    Tobacco abuse:  On nicotine patch.   Severe Protein energy malnutrition: nutrition consulted.      Discharge Instructions   Allergies as of 03/20/2017      Reactions   Robaxin [methocarbamol] Other (See Comments)   "jumpy limbs"   Shellfish-derived Products Nausea And Vomiting   Trazodone And Nefazodone Other (See Comments)   Reaction:  Muscle spasms   Contrast Media [iodinated Diagnostic Agents] Hives   Reglan [metoclopramide] Other (See Comments)   Reaction:  Muscle spasms      Medication List    Notice   Your medication list is not available because the admission was too long ago.    Follow-up Information    [place]. Schedule an appointment as soon as possible for a visit.   Why:  within 1 week,bring photo ID,discharge papers. Contact information: [place] [address] [phone]         Allergies  Allergen  Reactions  . Robaxin [Methocarbamol] Other (See Comments)    "jumpy limbs"  . Shellfish-Derived Products Nausea And Vomiting  . Trazodone And Nefazodone Other (See Comments)    Reaction:  Muscle spasms  . Contrast Media [Iodinated Diagnostic Agents] Hives  . Reglan [Metoclopramide] Other (See Comments)    Reaction:  Muscle spasms    Consultations:  none   Procedures/Studies: Dg Chest 2 View  Result Date: 07/05/2017 CLINICAL DATA:  Chest pain EXAM: CHEST  2 VIEW COMPARISON:  03/05/2017 FINDINGS: The heart size and mediastinal contours are within normal limits. Both lungs are clear. The visualized skeletal structures are unremarkable. IMPRESSION: No active cardiopulmonary disease. Electronically Signed   By: Donavan Foil M.D.   On: 07/05/2017 18:54      Subjective:  Sleepy, drowsy.   Discharge Exam: Vitals:   03/19/17 2130 03/20/17 0522  BP: 133/84 135/83  Pulse: 94 81  Resp: 18 16  Temp: 97.6 F (36.4 C) 97.7 F (36.5 C)  SpO2: 94% 100%   Vitals:   03/19/17 0928 03/19/17 1347 03/19/17 2130 03/20/17 0522  BP:  130/89 133/84 135/83  Pulse:  92 94 81  Resp:  18 18 16   Temp:  (!) 97.3 F (36.3 C) 97.6 F (36.4 C) 97.7 F (36.5 C)  TempSrc:  Oral Oral Axillary  SpO2: 97% 100% 94% 100%  Weight:    56.7 kg (125 lb 1.6 oz)  Height:        General: Pt is alert, awake, not in acute distress Cardiovascular: RRR, S1/S2 +, no rubs,  no gallops Respiratory: CTA bilaterally, no wheezing, no rhonchi Abdominal: Soft, NT, ND, bowel sounds + Extremities: no edema, no cyanosis    The results of significant diagnostics from this hospitalization (including imaging, microbiology, ancillary and laboratory) are listed below for reference.     Microbiology: No results found for this or any previous visit (from the past 240 hour(s)).   Labs: BNP (last 3 results) No results for input(s): BNP in the last 8760 hours. Basic Metabolic Panel: Recent Labs  Lab 07/10/17 1931  NA  136  K 3.1*  CL 101  CO2 21*  GLUCOSE 114*  BUN 6  CREATININE 0.64  CALCIUM 8.7*   Liver Function Tests: Recent Labs  Lab 07/10/17 1931  AST 24  ALT 27  ALKPHOS 88  BILITOT 0.5  PROT 6.5  ALBUMIN 3.4*   Recent Labs  Lab 07/10/17 1931  LIPASE 150*   No results for input(s): AMMONIA in the last 168 hours. CBC: Recent Labs  Lab 07/10/17 1931  WBC 14.8*  NEUTROABS 11.0*  HGB 9.7*  HCT 29.6*  MCV 85.1  PLT 285   Cardiac Enzymes: No results for input(s): CKTOTAL, CKMB, CKMBINDEX, TROPONINI in the last 168 hours. BNP: Invalid input(s): POCBNP CBG: No results for input(s): GLUCAP in the last 168 hours. D-Dimer No results for input(s): DDIMER in the last 72 hours. Hgb A1c No results for input(s): HGBA1C in the last 72 hours. Lipid Profile No results for input(s): CHOL, HDL, LDLCALC, TRIG, CHOLHDL, LDLDIRECT in the last 72 hours. Thyroid function studies No results for input(s): TSH, T4TOTAL, T3FREE, THYROIDAB in the last 72 hours.  Invalid input(s): FREET3 Anemia work up No results for input(s): VITAMINB12, FOLATE, FERRITIN, TIBC, IRON, RETICCTPCT in the last 72 hours. Urinalysis    Component Value Date/Time   COLORURINE YELLOW 03/18/2017 [place] 03/18/2017 1643   LABSPEC 1.017 03/18/2017 1643   PHURINE 5.0 03/18/2017 1643   GLUCOSEU NEGATIVE 03/18/2017 1643   HGBUR NEGATIVE 03/18/2017 1643   BILIRUBINUR NEGATIVE 03/18/2017 1643   KETONESUR 5 (A) 03/18/2017 1643   PROTEINUR NEGATIVE 03/18/2017 1643   NITRITE NEGATIVE 03/18/2017 1643   LEUKOCYTESUR NEGATIVE 03/18/2017 1643   Sepsis Labs Invalid input(s): PROCALCITONIN,  WBC,  LACTICIDVEN Microbiology No results found for this or any previous visit (from the past 240 hour(s)).   Time coordinating discharge: Over 30 minutes  SIGNED:   Identity Chart Correction Analyst Three, MD  [facility] 07/14/2017, 11:45 AM Pager   If 7PM-7AM, please contact  night-coverage [email or web address] Password [email or web address]

## 2017-03-22 ENCOUNTER — Emergency Department (HOSPITAL_COMMUNITY)
Admission: EM | Admit: 2017-03-22 | Discharge: 2017-03-22 | Disposition: A | Discharge status: Home / Self Care | Payer: Self-pay | Source: Home / Self Care | Attending: Emergency Medicine | Admitting: Emergency Medicine

## 2017-03-22 DIAGNOSIS — K2920 Alcoholic gastritis without bleeding: Secondary | ICD-10-CM

## 2017-03-22 DIAGNOSIS — K852 Alcohol induced acute pancreatitis without necrosis or infection: Secondary | ICD-10-CM

## 2017-03-22 DIAGNOSIS — Z79899 Other long term (current) drug therapy: Secondary | ICD-10-CM

## 2017-03-22 DIAGNOSIS — I10 Essential (primary) hypertension: Secondary | ICD-10-CM

## 2017-03-22 DIAGNOSIS — F17210 Nicotine dependence, cigarettes, uncomplicated: Secondary | ICD-10-CM

## 2017-03-22 LAB — COMPREHENSIVE METABOLIC PANEL
ALBUMIN: 3.9 g/dL (ref 3.5–5.0)
ALT: 30 U/L (ref 17–63)
AST: 37 U/L (ref 15–41)
Alkaline Phosphatase: 94 U/L (ref 38–126)
Anion gap: 10 (ref 5–15)
CHLORIDE: 106 mmol/L (ref 101–111)
CO2: 23 mmol/L (ref 22–32)
CREATININE: 0.57 mg/dL — AB (ref 0.61–1.24)
Calcium: 9 mg/dL (ref 8.9–10.3)
GFR calc Af Amer: >60 mL/min (ref >60)
GLUCOSE: 71 mg/dL (ref 65–99)
POTASSIUM: 3.4 mmol/L — AB (ref 3.5–5.1)
Sodium: 139 mmol/L (ref 135–145)
Total Bilirubin: 0.3 mg/dL (ref 0.3–1.2)
Total Protein: 8.1 g/dL (ref 6.5–8.1)

## 2017-03-22 LAB — CBC WITH DIFFERENTIAL/PLATELET
BASOS ABS: 0 10*3/uL (ref 0.0–0.1)
Basophils Relative: 0 %
EOS PCT: 3 %
Eosinophils Absolute: 0.4 10*3/uL (ref 0.0–0.7)
HCT: 33.9 % — ABNORMAL LOW (ref 39.0–52.0)
Hemoglobin: 11.4 g/dL — ABNORMAL LOW (ref 13.0–17.0)
LYMPHS PCT: 26 %
Lymphs Abs: 4.1 10*3/uL — ABNORMAL HIGH (ref 0.7–4.0)
MCH: 29.9 pg (ref 26.0–34.0)
MCHC: 33.6 g/dL (ref 30.0–36.0)
MCV: 89 fL (ref 78.0–100.0)
MONO ABS: 0.8 10*3/uL (ref 0.1–1.0)
Monocytes Relative: 5 %
NEUTROS ABS: 10.6 10*3/uL — AB (ref 1.7–7.7)
Neutrophils Relative %: 66 %
PLATELETS: 371 10*3/uL (ref 150–400)
RBC: 3.81 MIL/uL — AB (ref 4.22–5.81)
RDW: 20.4 % — AB (ref 11.5–15.5)
WBC: 16 10*3/uL — AB (ref 4.0–10.5)

## 2017-03-22 LAB — LIPASE, BLOOD: LIPASE: 290 U/L — AB (ref 11–51)

## 2017-03-22 MED ORDER — OMEPRAZOLE 20 MG PO CPDR: 20.0000 mg | DELAYED_RELEASE_CAPSULE | Freq: Every day | ORAL | 0 refills | Status: DC

## 2017-03-22 MED ORDER — ACETAMINOPHEN 325 MG PO TABS
650.0000 mg | ORAL_TABLET | Freq: Once | ORAL | Status: AC
  Administered 2017-03-22: 650 mg via ORAL
  Filled 2017-03-22: qty 2

## 2017-03-22 MED ORDER — CHLORDIAZEPOXIDE HCL 25 MG PO CAPS: ORAL_CAPSULE | ORAL | 0 refills | Status: DC

## 2017-03-22 MED ORDER — ONDANSETRON 8 MG PO TBDP: 8.0000 mg | ORAL_TABLET | Freq: Three times a day (TID) | ORAL | 0 refills | Status: DC | PRN

## 2017-03-22 MED ORDER — HYDROCODONE-ACETAMINOPHEN 5-325 MG PO TABS
1.0000 | ORAL_TABLET | Freq: Once | ORAL | Status: AC
  Administered 2017-03-22: 1 via ORAL
  Filled 2017-03-22: qty 1

## 2017-03-22 MED ORDER — GI COCKTAIL ~~LOC~~
30.0000 mL | Freq: Once | ORAL | Status: AC
  Administered 2017-03-22: 30 mL via ORAL
  Filled 2017-03-22: qty 30

## 2017-03-22 MED ORDER — ALUMINUM-MAGNESIUM-SIMETHICONE 200-200-20 MG/5ML PO SUSP: 30.0000 mL | Freq: Three times a day (TID) | ORAL | 0 refills | Status: DC

## 2017-03-22 MED ORDER — HYDROMORPHONE HCL 1 MG/ML IJ SOLN
1.0000 mg | Freq: Once | INTRAMUSCULAR | Status: AC
  Administered 2017-03-22: 1 mg via INTRAVENOUS
  Filled 2017-03-22: qty 1

## 2017-03-22 MED ORDER — ONDANSETRON HCL 4 MG/2ML IJ SOLN
4.0000 mg | Freq: Once | INTRAMUSCULAR | Status: AC
  Administered 2017-03-22: 4 mg via INTRAVENOUS
  Filled 2017-03-22: qty 2

## 2017-03-22 MED ORDER — HYDROCODONE-ACETAMINOPHEN 5-325 MG PO TABS: 1.0000 | ORAL_TABLET | Freq: Four times a day (QID) | ORAL | 0 refills | Status: DC | PRN

## 2017-03-22 NOTE — ED Provider Notes (Addendum)
[facility] DEPT Provider Note   CSN: [id number] Arrival date & time: 03/22/17  1603     History   Chief Complaint Chief Complaint  Patient presents with  . Abdominal Pain    HPI [name] is a 46 y.o. male.  HPI Patient comes in with chief complaint of abdominal pain. Patient has history of heavy alcohol use, and was recently admitted for pancreatitis.  Patient signed out AMA 2 days ago, with some disagreement on pain management and also because of his anxiety.  Patient reports that his pain was tolerable when he left however the pain got worse today.  Patient's pain is located in the epigastric region and he has associated nausea with emesis x2.  Patient denies any blood in the vomitus.  No bloody stools either.  Patient has not been taking any medicine for pain.  He also admits to alcohol use after leaving AMA.  Past Medical History:  Diagnosis Date  . Hypertension     Patient Active Problem List   Diagnosis Date Noted  . Pancreatitis 03/18/2017  . Anemia 03/18/2017  . Tachycardia 03/18/2017  . Nausea & vomiting 03/18/2017  . Diarrhea 03/18/2017    Past Surgical History:  Procedure Laterality Date  . EYE SURGERY         Home Medications    Prior to Admission medications   Medication Sig Start Date End Date Taking? Authorizing Provider  acetaminophen (TYLENOL) 500 MG tablet Take 1,000 mg every 6 (six) hours as needed by mouth for moderate pain.   Yes [provider]  Cholecalciferol (VITAMIN D PO) Take 1 tablet daily by mouth. Strength unknown   Yes [provider]  aluminum-magnesium hydroxide-simethicone (MAALOX) 200-200-20 MG/5ML SUSP Take 30 mLs 4 (four) times daily -  before meals and at bedtime by mouth. 03/22/17   Varney Biles, MD  chlordiazePOXIDE (LIBRIUM) 25 MG capsule 50mg  PO TID x 1D, then 25-50mg  PO BID X 1D, then 25-50mg  PO QD X 1D 03/22/17   Varney Biles, MD   HYDROcodone-acetaminophen (NORCO/VICODIN) 5-325 MG tablet Take 1 tablet every 6 (six) hours as needed by mouth. 03/22/17   Varney Biles, MD  omeprazole (PRILOSEC) 20 MG capsule Take 1 capsule (20 mg total) daily by mouth. 03/22/17   Kathrynn Humble, Makenah Karas, MD  ondansetron (ZOFRAN ODT) 8 MG disintegrating tablet Take 1 tablet (8 mg total) every 8 (eight) hours as needed by mouth for nausea. 03/22/17   Varney Biles, MD    Family History Family History  Problem Relation Age of Onset  . Hypertension Mother   . Melanoma Mother   . Alcoholism Father   . Cirrhosis Father     Social History Social History   Tobacco Use  . Smoking status: Current Every Day Smoker    Packs/day: 1.00    Types: Cigarettes  . Smokeless tobacco: Current User  Substance Use Topics  . Alcohol use: Yes    Alcohol/week: 8.4 oz    Types: 14 Standard drinks or equivalent per week  . Drug use: Yes    Frequency: 4.0 times per week    Types: Cocaine     Allergies   Robaxin [methocarbamol]; Shellfish-derived products; Trazodone and nefazodone; Contrast media [iodinated diagnostic agents]; and Reglan [metoclopramide]   Review of Systems Review of Systems  Constitutional: Positive for activity change.  Respiratory: Negative for shortness of breath.   Cardiovascular: Negative for chest pain.  Gastrointestinal: Positive for abdominal pain, nausea and vomiting.  Allergic/Immunologic: Negative for  immunocompromised state.  Hematological: Does not bruise/bleed easily.  All other systems reviewed and are negative.    Physical Exam Updated Vital Signs BP (!) 125/93   Pulse 94   Temp 97.7 F (36.5 C) (Oral)   Resp 18   SpO2 100%   Physical Exam  Constitutional: He is oriented to person, place, and time. He appears well-developed.  HENT:  Head: Atraumatic.  Neck: Neck supple.  Cardiovascular: Normal rate.  Pulmonary/Chest: Effort normal.  Abdominal: Soft. There is tenderness in the epigastric area.  There is guarding. There is no rigidity and no rebound.  Neurological: He is alert and oriented to person, place, and time.  Skin: Skin is warm.  Nursing note and vitals reviewed.    ED Treatments / Results  Labs (all labs ordered are listed, but only abnormal results are displayed) Labs Reviewed  CBC WITH DIFFERENTIAL/PLATELET - Abnormal; Notable for the following components:      Result Value   WBC 16.0 (*)    RBC 3.81 (*)    Hemoglobin 11.4 (*)    HCT 33.9 (*)    RDW 20.4 (*)    Neutro Abs 10.6 (*)    Lymphs Abs 4.1 (*)    All other components within normal limits  COMPREHENSIVE METABOLIC PANEL - Abnormal; Notable for the following components:   Potassium 3.4 (*)    BUN <5 (*)    Creatinine, Ser 0.57 (*)    All other components within normal limits  LIPASE, BLOOD - Abnormal; Notable for the following components:   Lipase 290 (*)    All other components within normal limits    EKG  EKG Interpretation None       Radiology No results found.  Procedures Procedures (including critical care time)  Medications Ordered in ED Medications  HYDROcodone-acetaminophen (NORCO/VICODIN) 5-325 MG per tablet 1 tablet (not administered)  HYDROmorphone (DILAUDID) injection 1 mg (1 mg Intravenous Given 03/22/17 1639)  HYDROmorphone (DILAUDID) injection 1 mg (1 mg Intravenous Given 03/22/17 1801)  gi cocktail (Maalox,Lidocaine,Donnatal) (30 mLs Oral Given 03/22/17 1800)  ondansetron (ZOFRAN) injection 4 mg (4 mg Intravenous Given 03/22/17 1801)  acetaminophen (TYLENOL) tablet 650 mg (650 mg Oral Given 03/22/17 1800)     Initial Impression / Assessment and Plan / ED Course  I have reviewed the triage vital signs and the nursing notes.  Pertinent labs & imaging results that were available during my care of the patient were reviewed by me and considered in my medical decision making (see chart for details).  Clinical Course as of Mar 22 1840  Wed Mar 22, 2017  1744 Labs show  elevated lipase at 290.  Electrolytes are normal.  Patient reports that the pain is improved to 5 out of 10.  [AN]  1833 Pt reassessed. [name] VSS and WNL. [name] cap refill < 3 seconds. Pt has been hydrated in the ER and now passed po challenge. We will discharge with antiemetic. Strict ER return precautions have been discussed and pt will return if he is unable to tolerate fluids and symptoms are getting worse.   [AN]    Clinical Course User Index [AN] Varney Biles, MD    DDx includes:  Pancreatitis Hepatobiliary pathology including cholecystitis Gastritis/PUD  Patient comes in with chief complaint of abdominal pain.  Patient has history of alcohol abuse and resultant pancreatitis.  CT scan from 1110 reviewed.  Patient left AMA 2 days ago without any medications.  It appears that the pain returned today after  alcohol intake.  Basic labs ordered.  Patient will get IV hydration and some pain control.  P.o. challenge ordered as well.  Final Clinical Impressions(s) / ED Diagnoses   Final diagnoses:  Alcohol-induced acute pancreatitis without infection or necrosis  Acute alcoholic gastritis without hemorrhage    ED Discharge Orders        Ordered    aluminum-magnesium hydroxide-simethicone (MAALOX) 200-200-20 MG/5ML SUSP  3 times daily before meals & bedtime     03/22/17 1836    omeprazole (PRILOSEC) 20 MG capsule  Daily     03/22/17 1836    HYDROcodone-acetaminophen (NORCO/VICODIN) 5-325 MG tablet  Every 6 hours PRN     03/22/17 1836    ondansetron (ZOFRAN ODT) 8 MG disintegrating tablet  Every 8 hours PRN     03/22/17 1836    chlordiazePOXIDE (LIBRIUM) 25 MG capsule     03/22/17 1839       Varney Biles, MD 03/22/17 1744    Varney Biles, MD 03/22/17 7262    Varney Biles, MD 07/15/17 1402

## 2017-03-22 NOTE — ED Notes (Signed)
Patient given water

## 2017-03-22 NOTE — ED Notes (Signed)
ED Provider at bedside. 

## 2017-03-22 NOTE — Discharge Instructions (Signed)
Please use the clear liquid diet only for the next few days. Take the pain meds and nausea meds. TAKE LIBRIUM FOR ALCOHOL DETOX.  Return to the ER if the alcohol withdrawals get severe (read instruction provided). Please return to the ER if your symptoms worsen; you have increased pain, fevers, chills, inability to keep any medications down, confusion. Otherwise see the outpatient doctor as requested.

## 2017-03-22 NOTE — ED Triage Notes (Addendum)
Per EMS, patient from home, c/o abdominal pain with N/V x2 hours. Seen for same on Sunday at [facility] and left AMA.Reports last beer at 1430 today. Hx pancreatitis.

## 2017-03-22 NOTE — Progress Notes (Addendum)
Consult request has been received. CSW followed up with consult.    Pt stated he wants resources for treatment of his alcoholism.  CSW met with pt to offer resources, pt accepted  meeting schedules for area Alcoholics Anonymous 16-BWGY meetings as well as inpatient/outpatient SA Tx resources.  CSW provided education to the pt as to the efficacy of 12-step programs for community support for those needing support in addition to or other than inpatient/outpatient treatment. Pt appreciated CSW's efforts and thanked the CSW.  Please reconsult if future social work needs arise.  CSW signing off, as social work intervention is no longer needed.  [name]. Donnika Kucher, LCSW, LCAS, CSI Clinical Social Worker Ph: [phone]

## 2017-04-14 ENCOUNTER — Other Ambulatory Visit: Payer: Self-pay

## 2017-04-14 ENCOUNTER — Encounter (HOSPITAL_COMMUNITY): Payer: Self-pay

## 2017-04-14 ENCOUNTER — Emergency Department (HOSPITAL_COMMUNITY)
Admission: EM | Admit: 2017-04-14 | Discharge: 2017-04-14 | Disposition: A | Discharge status: Home / Self Care | Payer: Self-pay | Attending: Emergency Medicine | Admitting: Emergency Medicine

## 2017-04-14 ENCOUNTER — Emergency Department (HOSPITAL_COMMUNITY)
Admission: EM | Admit: 2017-04-14 | Discharge: 2017-04-14 | Disposition: A | Discharge status: Home / Self Care | Payer: Self-pay | Source: Home / Self Care

## 2017-04-14 DIAGNOSIS — Z5321 Procedure and treatment not carried out due to patient leaving prior to being seen by health care provider: Secondary | ICD-10-CM

## 2017-04-14 DIAGNOSIS — R1013 Epigastric pain: Secondary | ICD-10-CM | POA: Insufficient documentation

## 2017-04-14 DIAGNOSIS — Z79899 Other long term (current) drug therapy: Secondary | ICD-10-CM | POA: Insufficient documentation

## 2017-04-14 DIAGNOSIS — F17210 Nicotine dependence, cigarettes, uncomplicated: Secondary | ICD-10-CM | POA: Insufficient documentation

## 2017-04-14 DIAGNOSIS — J45909 Unspecified asthma, uncomplicated: Secondary | ICD-10-CM | POA: Insufficient documentation

## 2017-04-14 DIAGNOSIS — R109 Unspecified abdominal pain: Secondary | ICD-10-CM

## 2017-04-14 LAB — COMPREHENSIVE METABOLIC PANEL
ALBUMIN: 3.6 g/dL (ref 3.5–5.0)
ALK PHOS: 79 U/L (ref 38–126)
ALK PHOS: 81 U/L (ref 38–126)
ALT: 15 U/L — AB (ref 17–63)
ALT: 17 U/L (ref 17–63)
AST: 25 U/L (ref 15–41)
AST: 24 U/L (ref 15–41)
Albumin: 3.6 g/dL (ref 3.5–5.0)
Anion gap: 7 (ref 5–15)
Anion gap: 9 (ref 5–15)
BILIRUBIN TOTAL: 0.6 mg/dL (ref 0.3–1.2)
BUN: 7 mg/dL (ref 6–20)
BUN: 6 mg/dL (ref 6–20)
CALCIUM: 8.7 mg/dL — AB (ref 8.9–10.3)
CALCIUM: 9.2 mg/dL (ref 8.9–10.3)
CHLORIDE: 104 mmol/L (ref 101–111)
CO2: 26 mmol/L (ref 22–32)
CO2: 26 mmol/L (ref 22–32)
CREATININE: 0.77 mg/dL (ref 0.61–1.24)
CREATININE: 0.7 mg/dL (ref 0.61–1.24)
Chloride: 104 mmol/L (ref 101–111)
GFR calc non Af Amer: >60 mL/min (ref >60)
GLUCOSE: 99 mg/dL (ref 65–99)
Glucose, Bld: 97 mg/dL (ref 65–99)
Potassium: 3.9 mmol/L (ref 3.5–5.1)
Potassium: 4 mmol/L (ref 3.5–5.1)
SODIUM: 137 mmol/L (ref 135–145)
Sodium: 139 mmol/L (ref 135–145)
TOTAL PROTEIN: 7.2 g/dL (ref 6.5–8.1)
Total Bilirubin: 0.6 mg/dL (ref 0.3–1.2)
Total Protein: 7.6 g/dL (ref 6.5–8.1)

## 2017-04-14 LAB — URINALYSIS, ROUTINE W REFLEX MICROSCOPIC
Bilirubin Urine: NEGATIVE
Glucose, UA: NEGATIVE mg/dL
Hgb urine dipstick: NEGATIVE
KETONES UR: NEGATIVE mg/dL
LEUKOCYTES UA: NEGATIVE
Nitrite: NEGATIVE
PROTEIN: NEGATIVE mg/dL
Specific Gravity, Urine: 1.004 — ABNORMAL LOW (ref 1.005–1.030)
pH: 7 (ref 5.0–8.0)

## 2017-04-14 LAB — CBC
HCT: 29.2 % — ABNORMAL LOW (ref 39.0–52.0)
HCT: 29.1 % — ABNORMAL LOW (ref 39.0–52.0)
Hemoglobin: 9.8 g/dL — ABNORMAL LOW (ref 13.0–17.0)
Hemoglobin: 9.7 g/dL — ABNORMAL LOW (ref 13.0–17.0)
MCH: 28.2 pg (ref 26.0–34.0)
MCH: 28.1 pg (ref 26.0–34.0)
MCHC: 33.6 g/dL (ref 30.0–36.0)
MCHC: 33.3 g/dL (ref 30.0–36.0)
MCV: 83.9 fL (ref 78.0–100.0)
MCV: 84.3 fL (ref 78.0–100.0)
PLATELETS: 324 10*3/uL (ref 150–400)
PLATELETS: 321 10*3/uL (ref 150–400)
RBC: 3.48 MIL/uL — ABNORMAL LOW (ref 4.22–5.81)
RBC: 3.45 MIL/uL — AB (ref 4.22–5.81)
RDW: 17.2 % — ABNORMAL HIGH (ref 11.5–15.5)
RDW: 17.1 % — ABNORMAL HIGH (ref 11.5–15.5)
WBC: 11.1 10*3/uL — ABNORMAL HIGH (ref 4.0–10.5)
WBC: 10.9 10*3/uL — AB (ref 4.0–10.5)

## 2017-04-14 LAB — LIPASE, BLOOD
LIPASE: 32 U/L (ref 11–51)
LIPASE: 28 U/L (ref 11–51)

## 2017-04-14 MED ORDER — GI COCKTAIL ~~LOC~~
30.0000 mL | Freq: Once | ORAL | Status: AC
  Administered 2017-04-14: 30 mL via ORAL
  Filled 2017-04-14: qty 30

## 2017-04-14 MED ORDER — KETOROLAC TROMETHAMINE 30 MG/ML IJ SOLN
30.0000 mg | Freq: Once | INTRAMUSCULAR | Status: AC
  Administered 2017-04-14: 30 mg via INTRAMUSCULAR
  Filled 2017-04-14: qty 1

## 2017-04-14 MED ORDER — ONDANSETRON 4 MG PO TBDP
4.0000 mg | ORAL_TABLET | Freq: Once | ORAL | Status: AC
  Administered 2017-04-14: 4 mg via ORAL
  Filled 2017-04-14: qty 1

## 2017-04-14 NOTE — ED Triage Notes (Addendum)
Patient here via EMS from home with complaints of left side abominal pain. Nausea, vomiting today. Hx of pancreatitis. No distention. Last drink yesterday morning.

## 2017-04-14 NOTE — ED Notes (Signed)
No response

## 2017-04-14 NOTE — ED Notes (Signed)
No response when called from lobby. 

## 2017-04-14 NOTE — ED Triage Notes (Signed)
Pt here for abd pain diarrhea and emesis from pancreatitis since 2300

## 2017-04-14 NOTE — Discharge Instructions (Signed)
Please read and follow all provided instructions.  Your diagnoses today include:  1. Epigastric pain     Tests performed today include: Blood counts and electrolytes Blood tests to check liver and kidney function Blood tests to check pancreas function Urine test to look for infection and pregnancy (in women) Vital signs. See below for your results today.   Medications prescribed:   Take any prescribed medications only as directed.  Home care instructions:  Follow any educational materials contained in this packet.  Follow-up instructions: Please follow-up with your primary care provider in the next 2 days for further evaluation of your symptoms.    Return instructions:  SEEK IMMEDIATE MEDICAL ATTENTION IF: The pain does not go away or becomes severe  A temperature above 101F develops  Repeated vomiting occurs (multiple episodes)  The pain becomes localized to portions of the abdomen. The right side could possibly be appendicitis. In an adult, the left lower portion of the abdomen could be colitis or diverticulitis.  Blood is being passed in stools or vomit (bright red or black tarry stools)  You develop chest pain, difficulty breathing, dizziness or fainting, or become confused, poorly responsive, or inconsolable (young children) If you have any other emergent concerns regarding your health  Additional Information: Abdominal (belly) pain can be caused by many things. Your caregiver performed an examination and possibly ordered blood/urine tests and imaging (CT scan, x-rays, ultrasound). Many cases can be observed and treated at home after initial evaluation in the emergency department. Even though you are being discharged home, abdominal pain can be unpredictable. Therefore, you need a repeated exam if your pain does not resolve, returns, or worsens. Most patients with abdominal pain don't have to be admitted to the hospital or have surgery, but serious problems like appendicitis  and gallbladder attacks can start out as nonspecific pain. Many abdominal conditions cannot be diagnosed in one visit, so follow-up evaluations are very important.  Your vital signs today were: BP (!) 164/103 (BP Location: Right Arm)    Pulse 90    Temp 98.3 F (36.8 C) (Oral)    Resp 18    SpO2 100%  If your blood pressure (bp) was elevated above 135/85 this visit, please have this repeated by your doctor within one month. --------------

## 2017-04-14 NOTE — ED Provider Notes (Signed)
[facility] EMERGENCY DEPARTMENT Provider Note   CSN: [id number] Arrival date & time: 04/14/17  0346     History   Chief Complaint Chief Complaint  Patient presents with  . Abdominal Pain    HPI [name] is a 46 y.o. male.  HPI  46 y.o. male with a hx of ETOH Abuse, Asthma, HTN, Pancreatitis, presents to the Emergency Department today due to epigastric abdominal pain. Hx same. Pt with care plan. Notes pain likely exacerbated by ETOH intake. States one beer yesterday with BBQ chicken. Notes nausea without emesis. No CP/SOB. No diaphoresis. No numbness/tingling. Pt able to tolerate PO. Rates pain 7/10. Throbbing. Pt asking what his Lipase level was today. No other symptoms noted .    Past Medical History:  Diagnosis Date  . Alcoholism /alcohol abuse ([place])   . Anemia   . Anxiety   . Arthritis    "knees; arms; elbows" (03/26/2015)  . Asthma   . Bipolar disorder ([place])   . Chronic bronchitis ([place])   . Chronic lower back pain   . Cocaine abuse ([place])   . Depression   . Family history of adverse reaction to anesthesia    "grandmother gets confused"  . Femoral condyle fracture ([place]) 03/08/2014   left medial/notes 03/09/2014  . GERD (gastroesophageal reflux disease)   . H/O hiatal hernia   . H/O suicide attempt 10/2012  . Heart murmur    "when he was little" (03/06/2013)  . High cholesterol   . History of blood transfusion 10/2012   "when I tried to commit suicide"  . History of stomach ulcers   . Hypertension   . Marijuana abuse, continuous   . Migraine    "a few times/year" (03/26/2015)  . Pancreatitis   . Pneumonia 1990's X 3  . PTSD (post-traumatic stress disorder)   . Shortness of breath    "can happen at anytime" (03/06/2013)  . Sickle cell trait ([place])   . WPW (Wolff-Parkinson-White syndrome)    [name] 03/06/2013    Patient Active Problem List   Diagnosis Date Noted  . Acute on chronic pancreatitis ([place]) 12/17/2016  . Chronic  alcoholic pancreatitis ([place]) 12/05/2016  . Intractable nausea and vomiting 12/05/2016  . Verbally abusive behavior 12/05/2016  . Normocytic anemia 12/05/2016  . Alcohol use disorder, severe, dependence ([place]) 07/25/2016  . Cocaine use disorder, severe, dependence ([place]) 07/25/2016  . 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 Leukocytosis   . Hospital acquired PNA 05/20/2015  . Pancreatitis 05/18/2015  . Pseudocyst of pancreas 05/18/2015  . Polysubstance abuse (tobacco, cocaine, THC, and ETOH) 03/26/2015  . Alcohol-induced chronic pancreatitis ([place])   . Benign essential HTN 02/06/2014  . Alcohol-induced acute pancreatitis 11/28/2013  . Pancreatic pseudocyst/cyst 11/25/2013  . MDD (major depressive disorder), recurrent severe, without psychosis ([place]) 10/22/2013  . Severe protein-calorie malnutrition ([place]) 10/10/2013  . Suicide attempt ([place]) 10/08/2013  . 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 TOBACCO ABUSE 03/23/2007    Past Surgical History:  Procedure Laterality Date  . CARDIAC CATHETERIZATION    . EYE SURGERY Left 1990's   "result of trauma"   . FACIAL FRACTURE SURGERY Left 1990's   "result of trauma"   . FRACTURE SURGERY    . HERNIA REPAIR    . LEFT HEART CATHETERIZATION WITH CORONARY ANGIOGRAM Right 03/07/2013   Procedure: LEFT HEART CATHETERIZATION WITH CORONARY ANGIOGRAM;  Surgeon: [name], MD;  Location: [facility];  Service: Cardiovascular;  Laterality: Right;  . UMBILICAL HERNIA REPAIR         Home Medications    Prior to Admission medications   Medication Sig Start Date End Date Taking? Authorizing Provider  albuterol (PROVENTIL HFA;VENTOLIN HFA) 108 (90 Base) MCG/ACT inhaler Inhale 2 puffs into the lungs every 6 (six) hours as needed for wheezing or shortness of breath. 07/25/16   Lindell Spar I, NP  Alum & Mag Hydroxide-Simeth (GI COCKTAIL) SUSP suspension Take 30 mLs by mouth  2 (two) times daily as needed for indigestion. Shake well. 03/05/17   Doristine Devoid, PA-C  cholestyramine (QUESTRAN) 4 g packet Take 1 packet (4 g total) by mouth 3 (three) times daily as needed (diarrhea). 07/25/16   Lindell Spar I, NP  dicyclomine (BENTYL) 20 MG tablet Take 1 tablet (20 mg total) by mouth 2 (two) times daily. 03/05/17   Doristine Devoid, PA-C  fluticasone (FLONASE) 50 MCG/ACT nasal spray Place 1 spray into both nostrils daily as needed for rhinitis. 07/25/16   Lindell Spar I, NP  folic acid (FOLVITE) 1 MG tablet Take 1 tablet (1 mg total) by mouth daily. Patient not taking: Reported on 02/12/2017 01/20/17   Georgette Shell, MD  gabapentin (NEURONTIN) 300 MG capsule Take 1 capsule (300 mg total) by mouth 3 (three) times daily. For agitation 07/25/16   Lindell Spar I, NP  hydrALAZINE (APRESOLINE) 25 MG tablet Take 1 tablet (25 mg total) by mouth every 8 (eight) hours. Patient taking differently: Take 25 mg by mouth at bedtime.  12/29/16   Barton Dubois, MD  lipase/protease/amylase (CREON) 12000 units CPEP capsule Take 2 capsules (24,000 Units total) by mouth 3 (three) times daily with meals. For pancreatitis 12/29/16   Barton Dubois, MD  loperamide (IMODIUM) 2 MG capsule Take 1 capsule (2 mg total) by mouth as needed for diarrhea or loose stools. 01/19/17   Georgette Shell, MD  loratadine (CLARITIN) 10 MG tablet Take 1 tablet (10 mg total) by mouth daily. (May purchase from over the counter): For allergies 07/26/16   Lindell Spar I, NP  metoprolol tartrate (LOPRESSOR) 25 MG tablet Take 1 tablet (25 mg total) by mouth 2 (two) times daily. For high blood pressure 12/29/16   Barton Dubois, MD  Multiple Vitamin (MULTIVITAMIN WITH MINERALS) TABS tablet Take 1 tablet by mouth daily. 01/20/17   Georgette Shell, MD  omeprazole (PRILOSEC) 20 MG capsule Take 20 mg by mouth daily.    [provider]  ondansetron (ZOFRAN) 4 MG tablet Take 1 tablet (4 mg total) every 6  (six) hours by mouth. 03/18/17   Malvin Johns, MD  Oxycodone HCl 10 MG TABS Take 1 tablet (10 mg total) by mouth every 4 (four) hours as needed. Patient taking differently: Take 10 mg by mouth every 4 (four) hours as needed.  01/25/17   Julianne Rice, MD  pantoprazole (PROTONIX) 40 MG tablet Take 1 tablet (40 mg total) by mouth daily. 12/29/16   Barton Dubois, MD  QUEtiapine (SEROQUEL) 25 MG tablet Take 25 mg by mouth at bedtime.    [provider]  sertraline (ZOLOFT) 25 MG tablet Take 3 tablets (75 mg total) by mouth daily. For depression 07/26/16   Lindell Spar I, NP  tetrahydrozoline 0.05 % ophthalmic solution Place 1 drop into both eyes daily as needed (itching).    [provider]  thiamine 100 MG tablet Take 1 tablet (100 mg total) by mouth daily. Patient not taking: Reported on  02/12/2017 12/30/16   Barton Dubois, MD    Family History Family History  Problem Relation Age of Onset  . Hypertension Mother   . Hypertension Father   . Hypertension Other   . Coronary artery disease Other     Social History Social History   Tobacco Use  . Smoking status: Current Every Day Smoker    Packs/day: 1.00    Types: Cigarettes  . Smokeless tobacco: Never Used  Substance Use Topics  . Alcohol use: Yes  . Drug use: Yes    Types: Marijuana, Cocaine     Allergies   Robaxin [methocarbamol]; Shellfish-derived products; Trazodone and nefazodone; Contrast media [iodinated diagnostic agents]; and Reglan [metoclopramide]   Review of Systems Review of Systems ROS reviewed and all are negative for acute change except as noted in the HPI.  Physical Exam Updated Vital Signs BP (!) 164/103 (BP Location: Right Arm)   Pulse 90   Temp 98.3 F (36.8 C) (Oral)   Resp 18   SpO2 100%   Physical Exam  Constitutional: He is oriented to person, place, and time. He appears well-developed and well-nourished. No distress.  HENT:  Head: Normocephalic and atraumatic.  Right Ear:  Tympanic membrane, external ear and ear canal normal.  Left Ear: Tympanic membrane, external ear and ear canal normal.  Nose: Nose normal.  Mouth/Throat: Uvula is midline, oropharynx is clear and moist and mucous membranes are normal. No trismus in the jaw. No oropharyngeal exudate, posterior oropharyngeal erythema or tonsillar abscesses.  Eyes: EOM are normal. Pupils are equal, round, and reactive to light.  Neck: Normal range of motion. Neck supple. No tracheal deviation present.  Cardiovascular: Normal rate, regular rhythm, S1 normal, S2 normal, normal heart sounds, intact distal pulses and normal pulses.  Pulmonary/Chest: Effort normal and breath sounds normal. No respiratory distress. He has no decreased breath sounds. He has no wheezes. He has no rhonchi. He has no rales.  Abdominal: Normal appearance and bowel sounds are normal. There is tenderness in the epigastric area.  Abdomen soft  Musculoskeletal: Normal range of motion.  Neurological: He is alert and oriented to person, place, and time.  Skin: Skin is warm and dry.  Psychiatric: He has a normal mood and affect. His speech is normal and behavior is normal. Thought content normal.  Nursing note and vitals reviewed.    ED Treatments / Results  Labs (all labs ordered are listed, but only abnormal results are displayed) Labs Reviewed  CBC - Abnormal; Notable for the following components:      Result Value   WBC 10.9 (*)    RBC 3.45 (*)    Hemoglobin 9.7 (*)    HCT 29.1 (*)    RDW 17.1 (*)    All other components within normal limits  URINALYSIS, ROUTINE W REFLEX MICROSCOPIC - Abnormal; Notable for the following components:   Color, Urine COLORLESS (*)    Specific Gravity, Urine 1.004 (*)    All other components within normal limits  LIPASE, BLOOD  COMPREHENSIVE METABOLIC PANEL    EKG  EKG Interpretation None       Radiology No results found.  Procedures Procedures (including critical care time)  Medications  Ordered in ED Medications  gi cocktail (Maalox,Lidocaine,Donnatal) (not administered)  ketorolac (TORADOL) 30 MG/ML injection 30 mg (not administered)  ondansetron (ZOFRAN-ODT) disintegrating tablet 4 mg (not administered)     Initial Impression / Assessment and Plan / ED Course  I have reviewed the triage vital signs and  the nursing notes.  Pertinent labs & imaging results that were available during my care of the patient were reviewed by me and considered in my medical decision making (see chart for details).  Final Clinical Impressions(s) / ED Diagnoses  {I have reviewed and evaluated the relevant laboratory values.   {I have reviewed the relevant previous healthcare records.  {I obtained HPI from historian.   ED Course:  Assessment: Patient is a 46 y.o. male ith a hx of ETOH Abuse, Asthma, HTN, Pancreatitis, presents to the Emergency Department today due to epigastric abdominal pain. Hx same. Pt with care plan. Notes pain likely exacerbated by ETOH intake. States one beer yesterday with BBQ chicken. Notes nausea without emesis. No CP/SOB. No diaphoresis. No numbness/tingling. Pt able to tolerate PO. Rates pain 7/10. Throbbing. Pt asking what his Lipase level was today. On exam, nontoxic, nonseptic appearing, in no apparent distress. Patient's pain and other symptoms adequately managed in emergency department.  Labs and vitals reviewed.  Patient does not meet the SIRS or Sepsis criteria.  On repeat exam patient does not have a surgical abdomen and there are no peritoneal signs.  No indication of appendicitis, bowel obstruction, bowel perforation, cholecystitis, diverticulitis. Likely gastritis vs mild pancreatitis. Patient discharged home with symptomatic treatment and given strict instructions for follow-up with their primary care physician.  I have also discussed reasons to return immediately to the ER.  Patient expresses understanding and agrees with plan.  Disposition/Plan:  DC  Home Additional Verbal discharge instructions given and discussed with patient.  Pt Instructed to f/u with PCP in the next week for evaluation and treatment of symptoms. Return precautions given Pt acknowledges and agrees with plan  Supervising Physician Tanna Furry, MD  Final diagnoses:  Epigastric pain    ED Discharge Orders    None       Shary Decamp, PA-C 04/14/17 [id number]    Tanna Furry, MD 04/19/17 1547

## 2017-04-14 NOTE — ED Notes (Signed)
No answer from lobby when called to room.

## 2017-05-18 IMAGING — CT CT ANGIO CHEST
2 of 8 series · 17 of 46 positions shown · IV contrast (omnipaque)
Comparison: Chest radiograph from 05/27/2014

CLINICAL DATA: Acute onset of dull achy pain at the central chest
and underneath the left breast. Current history of
Wolff-Parkinson-White syndrome. Initial encounter.

EXAM:
CT ANGIOGRAPHY CHEST WITH CONTRAST
TECHNIQUE: Multidetector CT imaging of the chest was performed using the
standard protocol during bolus administration of intravenous
contrast. Multiplanar CT image reconstructions and MIPs were
obtained to evaluate the vascular anatomy.
CONTRAST:  100mL OMNIPAQUE IOHEXOL 350 MG/ML SOLN

[Series 4: dissection 2.0 i30f 1 · axial · 0.68mm/px · z∈[-326,-28]mm · 14 of 167 slices shown]
[im 9/167  lung]
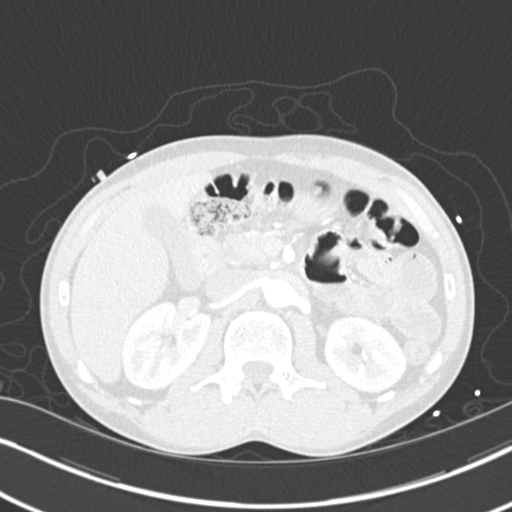
[im 25/167  soft-tissue]
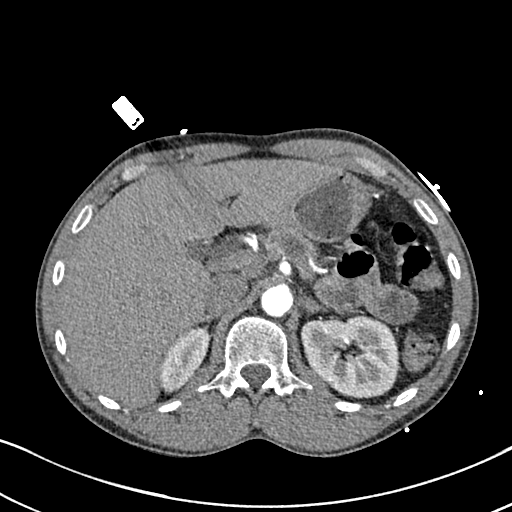
[im 34/167  lung]
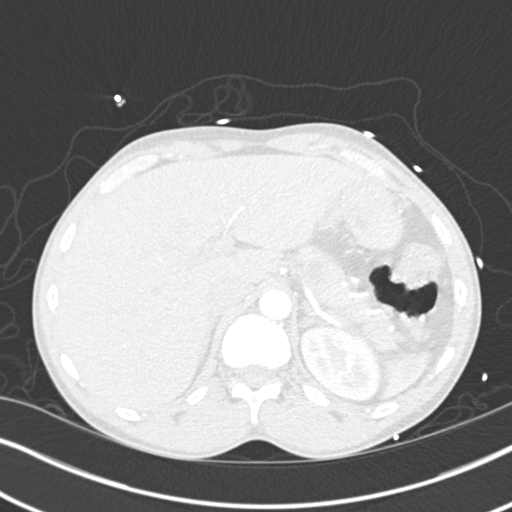
[im 42/167  soft-tissue]
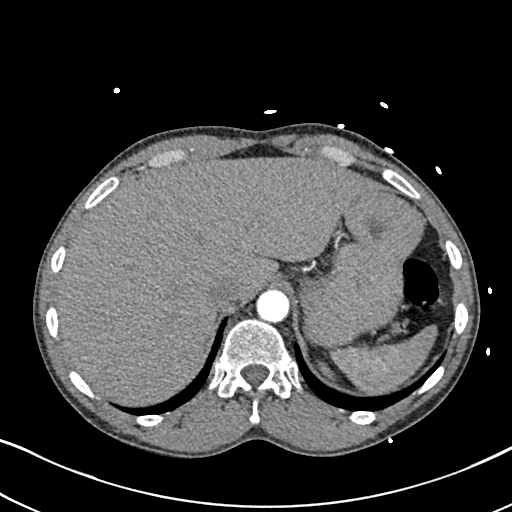
[im 59/167  lung]
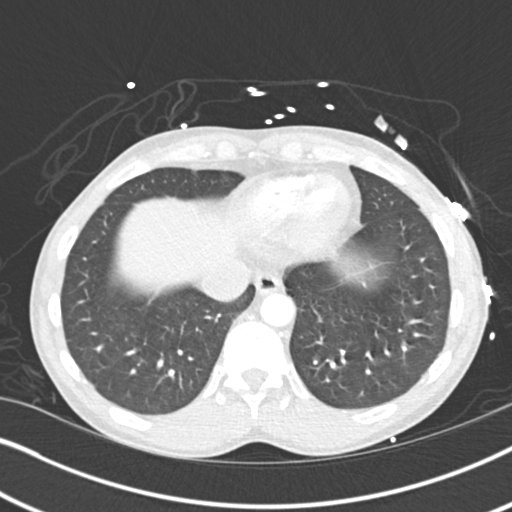
[im 67/167  soft-tissue]
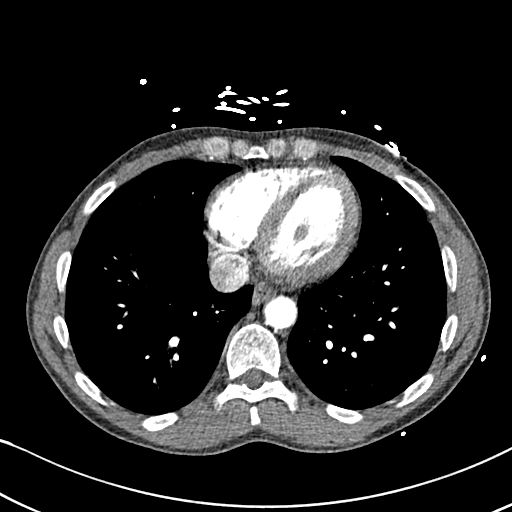
[im 75/167  lung]
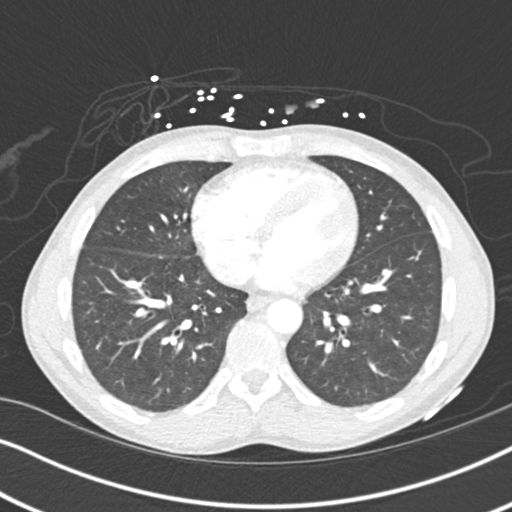
[im 92/167  soft-tissue]
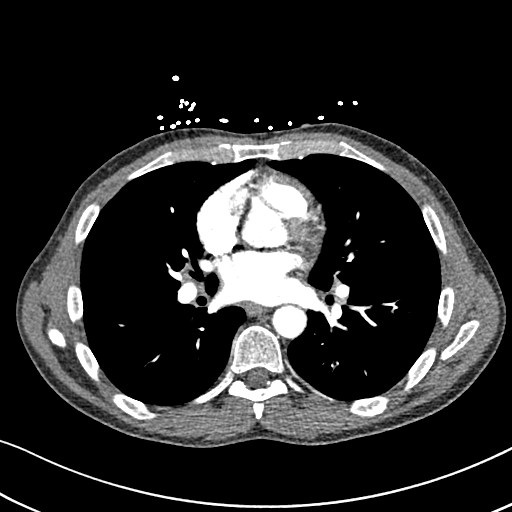
[im 100/167  lung]
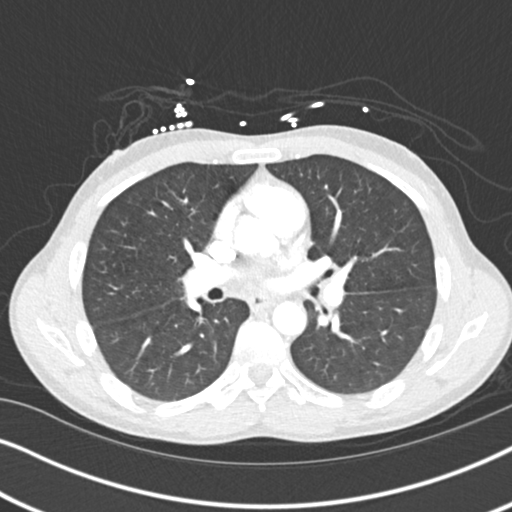
[im 108/167  soft-tissue]
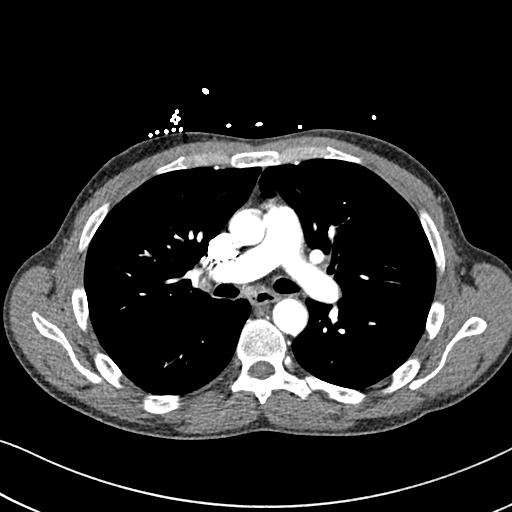
[im 125/167  lung]
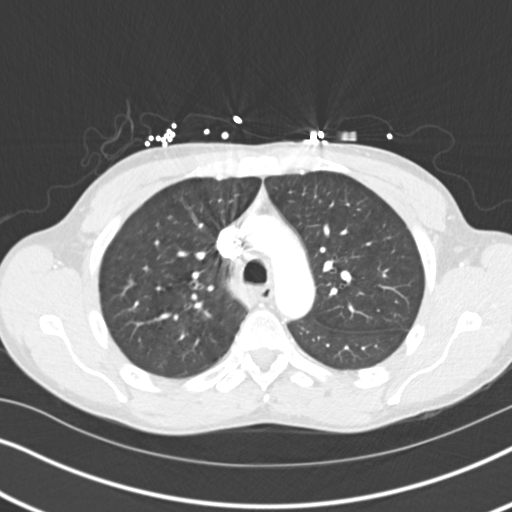
[im 133/167  soft-tissue]
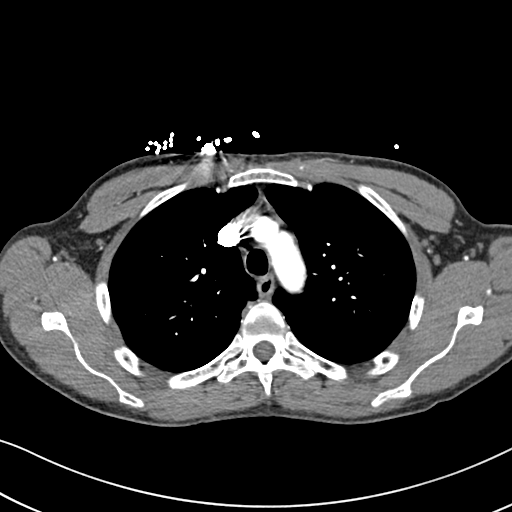
[im 142/167  lung]
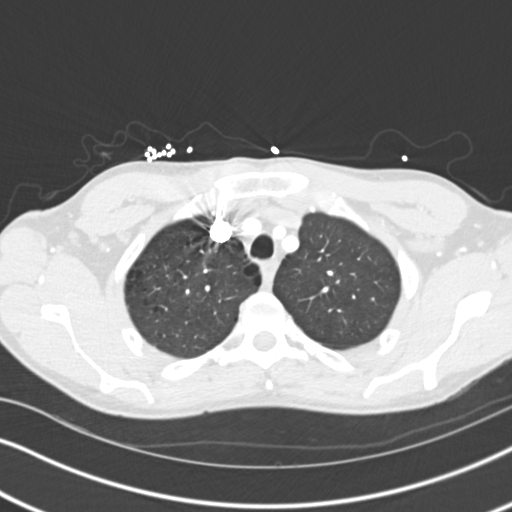
[im 158/167  soft-tissue]
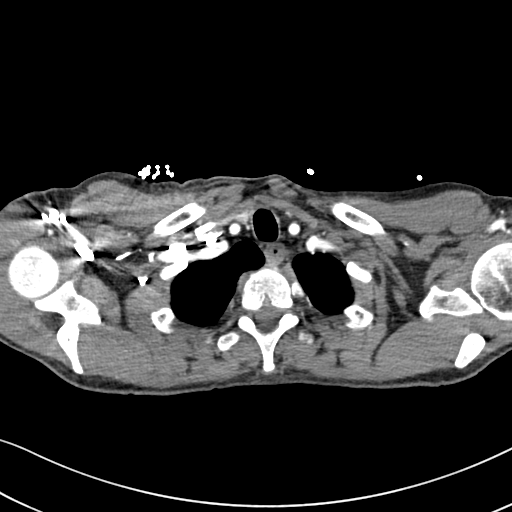

[Series 6: coronal mpr · coronal · 0.61mm/px · 3 of 103 slices shown]
[im 26/103  soft-tissue]
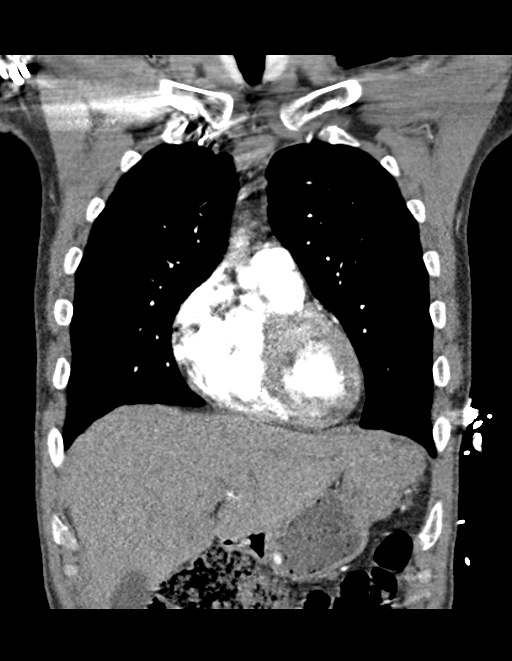
[im 52/103  soft-tissue]
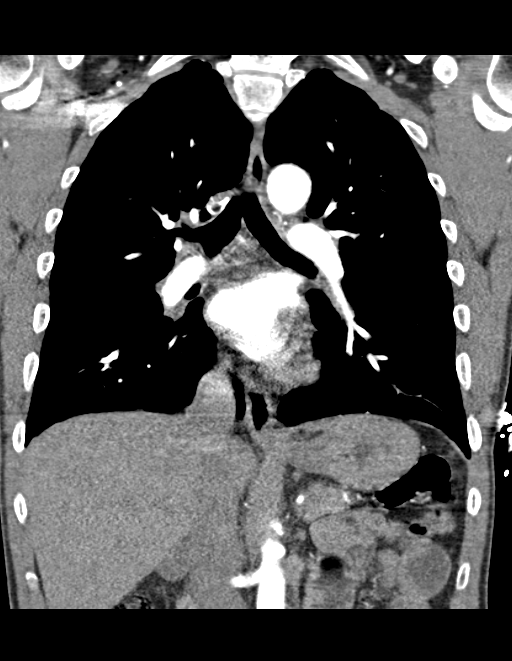
[im 77/103  soft-tissue]
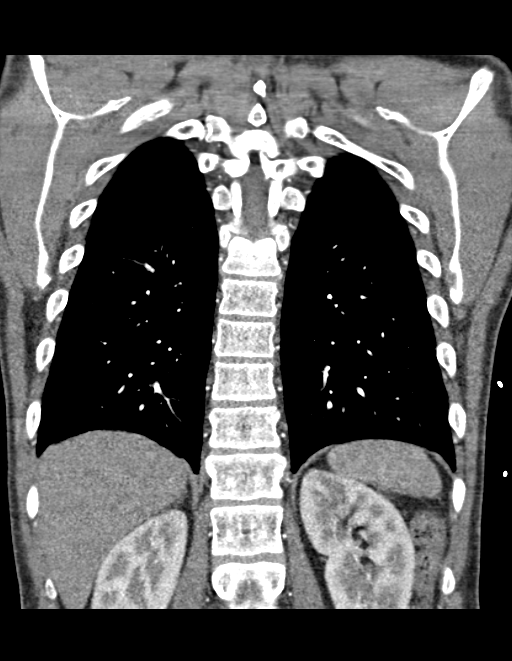

[17 of 46 positions shown; findings below may reference images not displayed]

FINDINGS: There is no evidence of aortic dissection. There is no evidence of
aneurysmal dilatation. No calcific atherosclerotic disease is seen.
The great vessels are grossly unremarkable in appearance.

There is no evidence of pulmonary embolus.

Scattered blebs are noted at the right lung apex, with minimal
underlying emphysema seen. The lungs are otherwise grossly clear.
There is no evidence of significant focal consolidation, pleural
effusion or pneumothorax. No masses are identified; no abnormal
focal contrast enhancement is seen.

The mediastinum is unremarkable in appearance. No mediastinal
lymphadenopathy is seen. No pericardial effusion is identified. No
axillary lymphadenopathy is seen. The visualized portions of the
thyroid gland are unremarkable in appearance.

The visualized portions of the liver and spleen are unremarkable.
Two cystic lesions are noted at the pancreatic body, measuring
cm and 1.3 cm in size. Would correlate with pancreatic lab values
and perform MRCP for further evaluation. The visualized portions of
the gallbladder, adrenal glands and kidneys are unremarkable in
appearance.

No acute osseous abnormalities are seen.

Review of the MIP images confirms the above findings.
IMPRESSION: 1. No evidence of aortic dissection. No evidence of aneurysmal
dilatation. No calcific atherosclerotic disease seen.
2. No evidence of pulmonary embolus.
3. Scattered blebs at the right lung apex, with minimal underlying
emphysema noted.
4. Two cystic lesions seen at the pancreatic body, measuring 2.3 cm
and 1.3 cm in size. Malignancy cannot be entirely excluded. Would
correlate with pancreatic lab values and recommend MRCP for further
evaluation.

## 2017-05-22 ENCOUNTER — Emergency Department (HOSPITAL_COMMUNITY)
Admission: EM | Admit: 2017-05-22 | Discharge: 2017-05-22 | Disposition: A | Discharge status: Home / Self Care | Payer: Self-pay | Attending: Emergency Medicine | Admitting: Emergency Medicine

## 2017-05-22 ENCOUNTER — Other Ambulatory Visit: Payer: Self-pay

## 2017-05-22 ENCOUNTER — Encounter (HOSPITAL_COMMUNITY): Payer: Self-pay | Admitting: Emergency Medicine

## 2017-05-22 DIAGNOSIS — I10 Essential (primary) hypertension: Secondary | ICD-10-CM | POA: Insufficient documentation

## 2017-05-22 DIAGNOSIS — G8929 Other chronic pain: Secondary | ICD-10-CM | POA: Insufficient documentation

## 2017-05-22 DIAGNOSIS — J45909 Unspecified asthma, uncomplicated: Secondary | ICD-10-CM | POA: Insufficient documentation

## 2017-05-22 DIAGNOSIS — Z79899 Other long term (current) drug therapy: Secondary | ICD-10-CM | POA: Insufficient documentation

## 2017-05-22 DIAGNOSIS — R1012 Left upper quadrant pain: Secondary | ICD-10-CM | POA: Insufficient documentation

## 2017-05-22 DIAGNOSIS — R109 Unspecified abdominal pain: Secondary | ICD-10-CM

## 2017-05-22 DIAGNOSIS — F17210 Nicotine dependence, cigarettes, uncomplicated: Secondary | ICD-10-CM | POA: Insufficient documentation

## 2017-05-22 LAB — COMPREHENSIVE METABOLIC PANEL
ALT: 40 U/L (ref 17–63)
AST: 50 U/L — ABNORMAL HIGH (ref 15–41)
Albumin: 4.4 g/dL (ref 3.5–5.0)
Alkaline Phosphatase: 108 U/L (ref 38–126)
Anion gap: 7 (ref 5–15)
BUN: 6 mg/dL (ref 6–20)
CO2: 24 mmol/L (ref 22–32)
Calcium: 9.4 mg/dL (ref 8.9–10.3)
Chloride: 105 mmol/L (ref 101–111)
Creatinine, Ser: 0.61 mg/dL (ref 0.61–1.24)
GFR calc Af Amer: >60 mL/min (ref >60)
GFR calc non Af Amer: >60 mL/min (ref >60)
Glucose, Bld: 103 mg/dL — ABNORMAL HIGH (ref 65–99)
Potassium: 4.3 mmol/L (ref 3.5–5.1)
Sodium: 136 mmol/L (ref 135–145)
Total Bilirubin: 0.6 mg/dL (ref 0.3–1.2)
Total Protein: 8.6 g/dL — ABNORMAL HIGH (ref 6.5–8.1)

## 2017-05-22 LAB — CBC
HCT: 39.1 % (ref 39.0–52.0)
Hemoglobin: 12.6 g/dL — ABNORMAL LOW (ref 13.0–17.0)
MCH: 28.8 pg (ref 26.0–34.0)
MCHC: 32.2 g/dL (ref 30.0–36.0)
MCV: 89.3 fL (ref 78.0–100.0)
Platelets: 272 10*3/uL (ref 150–400)
RBC: 4.38 MIL/uL (ref 4.22–5.81)
RDW: 17.9 % — ABNORMAL HIGH (ref 11.5–15.5)
WBC: 6.8 10*3/uL (ref 4.0–10.5)

## 2017-05-22 LAB — URINALYSIS, ROUTINE W REFLEX MICROSCOPIC
Bilirubin Urine: NEGATIVE
Glucose, UA: NEGATIVE mg/dL
Hgb urine dipstick: NEGATIVE
Ketones, ur: NEGATIVE mg/dL
Leukocytes, UA: NEGATIVE
Nitrite: NEGATIVE
Protein, ur: NEGATIVE mg/dL
Specific Gravity, Urine: 1.016 (ref 1.005–1.030)
pH: 5 (ref 5.0–8.0)

## 2017-05-22 LAB — LIPASE, BLOOD: Lipase: 22 U/L (ref 11–51)

## 2017-05-22 MED ORDER — SODIUM CHLORIDE 0.9 % IV BOLUS (SEPSIS): 1000.0000 mL | Freq: Once | INTRAVENOUS | Status: DC

## 2017-05-22 MED ORDER — FAMOTIDINE IN NACL 20-0.9 MG/50ML-% IV SOLN: 20.0000 mg | Freq: Once | INTRAVENOUS | Status: DC

## 2017-05-22 MED ORDER — GI COCKTAIL ~~LOC~~
30.0000 mL | Freq: Once | ORAL | Status: AC
  Administered 2017-05-22: 30 mL via ORAL
  Filled 2017-05-22: qty 30

## 2017-05-22 MED ORDER — HALOPERIDOL LACTATE 5 MG/ML IJ SOLN
2.5000 mg | Freq: Once | INTRAMUSCULAR | Status: AC
  Administered 2017-05-22: 2.5 mg via INTRAMUSCULAR
  Filled 2017-05-22: qty 1

## 2017-05-22 MED ORDER — HYDROMORPHONE HCL 1 MG/ML IJ SOLN: 1.0000 mg | Freq: Once | INTRAMUSCULAR | Status: DC

## 2017-05-22 MED ORDER — FAMOTIDINE 20 MG PO TABS
20.0000 mg | ORAL_TABLET | Freq: Once | ORAL | Status: AC
  Administered 2017-05-22: 20 mg via ORAL
  Filled 2017-05-22: qty 1

## 2017-05-22 MED ORDER — HALOPERIDOL LACTATE 5 MG/ML IJ SOLN: 2.5000 mg | Freq: Once | INTRAMUSCULAR | Status: DC

## 2017-05-22 NOTE — ED Triage Notes (Signed)
Pt brought in by [facility] from home with c/o LUQ abd pain that started about 1 am  Describes the pain as stabbing and states it radiates into his back on the left side  Pt has hx of pancreatitis  Last flare up was 3 mths ago  Pt states he last drank 2 days ago  Vomited tonight, undigested food   Pt states he took 2 hydrocodone tonight with a little relief

## 2017-05-24 NOTE — ED Provider Notes (Signed)
[facility] DEPT Provider Note   CSN: [id number] Arrival date & time: 05/22/17  4098     History   Chief Complaint Chief Complaint  Patient presents with  . Abdominal Pain    HPI [name] is a 47 y.o. male.  HPI   47 year old male with abdominal pain.  Well-known to the emergency room.  Chronic abdominal pain.  Often presents with pancreatitis.  He continues to abuse alcohol.  He reports that his pain is been worsening since around 1 AM today.  Pain is in epigastrium left upper quadrant.  Describes it as stabbing.  Radiates into his back.  Associate with nausea and vomiting.  No respiratory complaints.  No urinary complaints.  He took hydrocodone with no significant improvement.  Past Medical History:  Diagnosis Date  . Alcoholism /alcohol abuse ([place])   . Anemia   . Anxiety   . Arthritis    "knees; arms; elbows" (03/26/2015)  . Asthma   . Bipolar disorder ([place])   . Chronic bronchitis ([place])   . Chronic lower back pain   . Cocaine abuse ([place])   . Depression   . Family history of adverse reaction to anesthesia    "grandmother gets confused"  . Femoral condyle fracture ([place]) 03/08/2014   left medial/notes 03/09/2014  . GERD (gastroesophageal reflux disease)   . H/O hiatal hernia   . H/O suicide attempt 10/2012  . Heart murmur    "when he was little" (03/06/2013)  . High cholesterol   . History of blood transfusion 10/2012   "when I tried to commit suicide"  . History of stomach ulcers   . Hypertension   . Marijuana abuse, continuous   . Migraine    "a few times/year" (03/26/2015)  . Pancreatitis   . Pneumonia 1990's X 3  . PTSD (post-traumatic stress disorder)   . Shortness of breath    "can happen at anytime" (03/06/2013)  . Sickle cell trait ([place])   . WPW (Wolff-Parkinson-White syndrome)    [name] 03/06/2013    Patient Active Problem List   Diagnosis Date Noted  . Acute on chronic pancreatitis ([place]) 12/17/2016  .  Chronic alcoholic pancreatitis ([place]) 12/05/2016  . Intractable nausea and vomiting 12/05/2016  . Verbally abusive behavior 12/05/2016  . Normocytic anemia 12/05/2016  . Alcohol use disorder, severe, dependence ([place]) 07/25/2016  . Cocaine use disorder, severe, dependence ([place]) 07/25/2016  . Major depressive disorder, recurrent severe without psychotic features ([place]) 07/20/2016  . Leukocytosis   . Hospital acquired PNA 05/20/2015  . Pancreatitis 05/18/2015  . Pseudocyst of pancreas 05/18/2015  . Polysubstance abuse (tobacco, cocaine, THC, and ETOH) 03/26/2015  . Alcohol-induced chronic pancreatitis ([place])   . Benign essential HTN 02/06/2014  . Alcohol-induced acute pancreatitis 11/28/2013  . Pancreatic pseudocyst/cyst 11/25/2013  . MDD (major depressive disorder), recurrent severe, without psychosis ([place]) 10/22/2013  . Severe protein-calorie malnutrition ([place]) 10/10/2013  . Suicide attempt ([place]) 10/08/2013  . 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 TOBACCO ABUSE 03/23/2007    Past Surgical History:  Procedure Laterality Date  . CARDIAC CATHETERIZATION    . EYE SURGERY Left 1990's   "result of trauma"   . FACIAL FRACTURE SURGERY Left 1990's   "result of trauma"   . FRACTURE SURGERY    . HERNIA REPAIR    . LEFT HEART CATHETERIZATION WITH CORONARY ANGIOGRAM Right 03/07/2013   Procedure: LEFT HEART CATHETERIZATION WITH CORONARY ANGIOGRAM;  Surgeon: [name], MD;  Location:  [facility] CATH LAB;  Service: Cardiovascular;  Laterality: Right;  . UMBILICAL HERNIA REPAIR         Home Medications    Prior to Admission medications   Medication Sig Start Date End Date Taking? Authorizing Provider  albuterol (PROVENTIL HFA;VENTOLIN HFA) 108 (90 Base) MCG/ACT inhaler Inhale 2 puffs into the lungs every 6 (six) hours as needed for wheezing or shortness of breath. 07/25/16  Yes Lindell Spar I, NP  Alum & Mag Hydroxide-Simeth (GI COCKTAIL) SUSP suspension Take 30  mLs by mouth 2 (two) times daily as needed for indigestion. Shake well. 03/05/17  Yes Leaphart, Zack Seal, PA-C  dicyclomine (BENTYL) 20 MG tablet Take 1 tablet (20 mg total) by mouth 2 (two) times daily. 03/05/17  Yes Leaphart, Zack Seal, PA-C  fluticasone (FLONASE) 50 MCG/ACT nasal spray Place 1 spray into both nostrils daily as needed for rhinitis. 07/25/16  Yes Lindell Spar I, NP  folic acid (FOLVITE) 1 MG tablet Take 1 tablet (1 mg total) by mouth daily. 01/20/17  Yes Georgette Shell, MD  gabapentin (NEURONTIN) 300 MG capsule Take 1 capsule (300 mg total) by mouth 3 (three) times daily. For agitation 07/25/16  Yes Lindell Spar I, NP  hydrALAZINE (APRESOLINE) 25 MG tablet Take 1 tablet (25 mg total) by mouth every 8 (eight) hours. Patient taking differently: Take 25 mg by mouth at bedtime.  12/29/16  Yes Barton Dubois, MD  lipase/protease/amylase (CREON) 12000 units CPEP capsule Take 2 capsules (24,000 Units total) by mouth 3 (three) times daily with meals. For pancreatitis 12/29/16  Yes Barton Dubois, MD  loperamide (IMODIUM) 2 MG capsule Take 1 capsule (2 mg total) by mouth as needed for diarrhea or loose stools. 01/19/17  Yes Georgette Shell, MD  loratadine (CLARITIN) 10 MG tablet Take 1 tablet (10 mg total) by mouth daily. (May purchase from over the counter): For allergies 07/26/16  Yes Lindell Spar I, NP  metoprolol tartrate (LOPRESSOR) 25 MG tablet Take 1 tablet (25 mg total) by mouth 2 (two) times daily. For high blood pressure 12/29/16  Yes Barton Dubois, MD  Multiple Vitamin (MULTIVITAMIN WITH MINERALS) TABS tablet Take 1 tablet by mouth daily. 01/20/17  Yes Georgette Shell, MD  omeprazole (PRILOSEC) 20 MG capsule Take 20 mg by mouth daily.   Yes [provider]  ondansetron (ZOFRAN) 4 MG tablet Take 1 tablet (4 mg total) every 6 (six) hours by mouth. Patient taking differently: Take 4 mg by mouth every 6 (six) hours as needed for nausea or vomiting.  03/18/17  Yes  Malvin Johns, MD  pantoprazole (PROTONIX) 40 MG tablet Take 1 tablet (40 mg total) by mouth daily. 12/29/16  Yes Barton Dubois, MD  sertraline (ZOLOFT) 25 MG tablet Take 3 tablets (75 mg total) by mouth daily. For depression 07/26/16  Yes Lindell Spar I, NP  tetrahydrozoline 0.05 % ophthalmic solution Place 1 drop into both eyes daily as needed (itching).   Yes [provider]  cholestyramine (QUESTRAN) 4 g packet Take 1 packet (4 g total) by mouth 3 (three) times daily as needed (diarrhea). Patient not taking: Reported on 05/22/2017 07/25/16   Lindell Spar I, NP  Oxycodone HCl 10 MG TABS Take 1 tablet (10 mg total) by mouth every 4 (four) hours as needed. Patient not taking: Reported on 05/22/2017 01/25/17   Julianne Rice, MD  thiamine 100 MG tablet Take 1 tablet (100 mg total) by mouth daily. Patient not taking: Reported on 02/12/2017 12/30/16   Dyann Kief,  Clifton James, MD    Family History Family History  Problem Relation Age of Onset  . Hypertension Mother   . Hypertension Father   . Hypertension Other   . Coronary artery disease Other     Social History Social History   Tobacco Use  . Smoking status: Current Every Day Smoker    Packs/day: 1.00    Types: Cigarettes  . Smokeless tobacco: Never Used  Substance Use Topics  . Alcohol use: Yes  . Drug use: Yes    Types: Marijuana     Allergies   Robaxin [methocarbamol]; Shellfish-derived products; Trazodone and nefazodone; Contrast media [iodinated diagnostic agents]; and Reglan [metoclopramide]   Review of Systems Review of Systems  All systems reviewed and negative, other than as noted in HPI.  Physical Exam Updated Vital Signs BP (!) 176/113   Pulse (!) 110   Temp 98.2 F (36.8 C) (Oral)   Resp 18   Ht 5\' 9"  (1.753 m)   Wt 63.5 kg (140 lb)   SpO2 98%   BMI 20.67 kg/m   Physical Exam  Constitutional: He appears well-developed and well-nourished. No distress.  HENT:  Head: Normocephalic and atraumatic.    Eyes: Conjunctivae are normal. Right eye exhibits no discharge. Left eye exhibits no discharge.  Neck: Neck supple.  Cardiovascular: Normal rate, regular rhythm and normal heart sounds. Exam reveals no gallop and no friction rub.  No murmur heard. Pulmonary/Chest: Effort normal and breath sounds normal. No respiratory distress.  Abdominal: Soft. He exhibits no distension. There is tenderness in the epigastric area and left upper quadrant. There is no rebound and no guarding.  Musculoskeletal: He exhibits no edema or tenderness.  Neurological: He is alert.  Skin: Skin is warm and dry.  Psychiatric: He has a normal mood and affect. His behavior is normal. Thought content normal.  Nursing note and vitals reviewed.    ED Treatments / Results  Labs (all labs ordered are listed, but only abnormal results are displayed) Labs Reviewed  COMPREHENSIVE METABOLIC PANEL - Abnormal; Notable for the following components:      Result Value   Glucose, Bld 103 (*)    Total Protein 8.6 (*)    AST 50 (*)    All other components within normal limits  CBC - Abnormal; Notable for the following components:   Hemoglobin 12.6 (*)    RDW 17.9 (*)    All other components within normal limits  LIPASE, BLOOD  URINALYSIS, ROUTINE W REFLEX MICROSCOPIC    EKG  EKG Interpretation None       Radiology No results found.  Procedures Procedures (including critical care time)  Medications Ordered in ED Medications  gi cocktail (Maalox,Lidocaine,Donnatal) (30 mLs Oral Given 05/22/17 1020)  haloperidol lactate (HALDOL) injection 2.5 mg (2.5 mg Intramuscular Given 05/22/17 1018)  famotidine (PEPCID) tablet 20 mg (20 mg Oral Given 05/22/17 1019)     Initial Impression / Assessment and Plan / ED Course  I have reviewed the triage vital signs and the nursing notes.  Pertinent labs & imaging results that were available during my care of the patient were reviewed by me and considered in my medical decision  making (see chart for details).     47 year old male with what I suspect is an exacerbation of his chronic abdominal pain.  He is treated with nonnarcotics with some improvement of his symptoms.  He was counseled on the health implications of his continued drinking.  He says that he has resources and  is not interested in another list.  Return precautions were discussed.  Final Clinical Impressions(s) / ED Diagnoses   Final diagnoses:  Chronic abdominal pain    ED Discharge Orders    None       Virgel Manifold, MD 05/24/17 [phone]

## 2017-05-26 ENCOUNTER — Encounter (HOSPITAL_COMMUNITY): Payer: Self-pay | Admitting: Emergency Medicine

## 2017-05-26 DIAGNOSIS — Z5321 Procedure and treatment not carried out due to patient leaving prior to being seen by health care provider: Secondary | ICD-10-CM | POA: Insufficient documentation

## 2017-05-26 LAB — COMPREHENSIVE METABOLIC PANEL
ALBUMIN: 4.1 g/dL (ref 3.5–5.0)
ALT: 39 U/L (ref 17–63)
AST: 53 U/L — AB (ref 15–41)
Alkaline Phosphatase: 111 U/L (ref 38–126)
Anion gap: 21 — ABNORMAL HIGH (ref 5–15)
BILIRUBIN TOTAL: 0.7 mg/dL (ref 0.3–1.2)
BUN: 10 mg/dL (ref 6–20)
CO2: 18 mmol/L — ABNORMAL LOW (ref 22–32)
Calcium: 9.2 mg/dL (ref 8.9–10.3)
Chloride: 100 mmol/L — ABNORMAL LOW (ref 101–111)
Creatinine, Ser: 1.17 mg/dL (ref 0.61–1.24)
GFR calc Af Amer: >60 mL/min (ref >60)
GFR calc non Af Amer: >60 mL/min (ref >60)
GLUCOSE: 69 mg/dL (ref 65–99)
Potassium: 3.6 mmol/L (ref 3.5–5.1)
Sodium: 139 mmol/L (ref 135–145)
TOTAL PROTEIN: 7.9 g/dL (ref 6.5–8.1)

## 2017-05-26 LAB — URINALYSIS, ROUTINE W REFLEX MICROSCOPIC
BACTERIA UA: NONE SEEN
Bilirubin Urine: NEGATIVE
Glucose, UA: NEGATIVE mg/dL
Hgb urine dipstick: NEGATIVE
Ketones, ur: 5 mg/dL — AB
Leukocytes, UA: NEGATIVE
NITRITE: NEGATIVE
Protein, ur: 30 mg/dL — AB
SPECIFIC GRAVITY, URINE: 1.018 (ref 1.005–1.030)
pH: 5 (ref 5.0–8.0)

## 2017-05-26 LAB — CBC
HEMATOCRIT: 33.1 % — AB (ref 39.0–52.0)
Hemoglobin: 11.2 g/dL — ABNORMAL LOW (ref 13.0–17.0)
MCH: 29.3 pg (ref 26.0–34.0)
MCHC: 33.8 g/dL (ref 30.0–36.0)
MCV: 86.6 fL (ref 78.0–100.0)
Platelets: 283 10*3/uL (ref 150–400)
RBC: 3.82 MIL/uL — ABNORMAL LOW (ref 4.22–5.81)
RDW: 18.3 % — AB (ref 11.5–15.5)
WBC: 15.1 10*3/uL — ABNORMAL HIGH (ref 4.0–10.5)

## 2017-05-26 LAB — ETHANOL: Alcohol, Ethyl (B): 37 mg/dL — ABNORMAL HIGH (ref <10)

## 2017-05-26 LAB — LIPASE, BLOOD: Lipase: 43 U/L (ref 11–51)

## 2017-05-26 NOTE — ED Triage Notes (Signed)
Pt reports drinking 3 forty ounce beers today after which point he began to vomit. Reports pink colored vomit, states x2. Reports lower abdominal pain. Reports cocaine today.  Lowered himself to the floor in triage. No fall. Ambulatory and alertx4 but obviously intoxicated.

## 2017-05-27 ENCOUNTER — Other Ambulatory Visit: Payer: Self-pay

## 2017-05-27 ENCOUNTER — Emergency Department (HOSPITAL_COMMUNITY)
Admission: EM | Admit: 2017-05-27 | Discharge: 2017-05-27 | Disposition: A | Discharge status: Home / Self Care | Payer: Self-pay | Attending: Emergency Medicine | Admitting: Emergency Medicine

## 2017-05-27 ENCOUNTER — Encounter (HOSPITAL_COMMUNITY): Payer: Self-pay

## 2017-05-27 ENCOUNTER — Inpatient Hospital Stay (HOSPITAL_COMMUNITY)
Admission: EM | Admit: 2017-05-27 | Discharge: 2017-05-30 | DRG: 439 | Disposition: A | Discharge status: Home / Self Care | Payer: Self-pay | Attending: Internal Medicine | Admitting: Internal Medicine

## 2017-05-27 DIAGNOSIS — Z79899 Other long term (current) drug therapy: Secondary | ICD-10-CM

## 2017-05-27 DIAGNOSIS — F4310 Post-traumatic stress disorder, unspecified: Secondary | ICD-10-CM | POA: Diagnosis present

## 2017-05-27 DIAGNOSIS — I10 Essential (primary) hypertension: Secondary | ICD-10-CM | POA: Diagnosis present

## 2017-05-27 DIAGNOSIS — R197 Diarrhea, unspecified: Secondary | ICD-10-CM | POA: Diagnosis present

## 2017-05-27 DIAGNOSIS — E860 Dehydration: Secondary | ICD-10-CM | POA: Diagnosis present

## 2017-05-27 DIAGNOSIS — Z888 Allergy status to other drugs, medicaments and biological substances status: Secondary | ICD-10-CM

## 2017-05-27 DIAGNOSIS — E876 Hypokalemia: Secondary | ICD-10-CM | POA: Diagnosis present

## 2017-05-27 DIAGNOSIS — F17210 Nicotine dependence, cigarettes, uncomplicated: Secondary | ICD-10-CM | POA: Diagnosis present

## 2017-05-27 DIAGNOSIS — E7800 Pure hypercholesterolemia, unspecified: Secondary | ICD-10-CM | POA: Diagnosis present

## 2017-05-27 DIAGNOSIS — D573 Sickle-cell trait: Secondary | ICD-10-CM | POA: Diagnosis present

## 2017-05-27 DIAGNOSIS — Z8701 Personal history of pneumonia (recurrent): Secondary | ICD-10-CM

## 2017-05-27 DIAGNOSIS — K920 Hematemesis: Secondary | ICD-10-CM | POA: Diagnosis present

## 2017-05-27 DIAGNOSIS — I456 Pre-excitation syndrome: Secondary | ICD-10-CM | POA: Diagnosis present

## 2017-05-27 DIAGNOSIS — K219 Gastro-esophageal reflux disease without esophagitis: Secondary | ICD-10-CM | POA: Diagnosis present

## 2017-05-27 DIAGNOSIS — F319 Bipolar disorder, unspecified: Secondary | ICD-10-CM | POA: Diagnosis present

## 2017-05-27 DIAGNOSIS — K861 Other chronic pancreatitis: Principal | ICD-10-CM | POA: Diagnosis present

## 2017-05-27 DIAGNOSIS — Z915 Personal history of self-harm: Secondary | ICD-10-CM

## 2017-05-27 DIAGNOSIS — R109 Unspecified abdominal pain: Secondary | ICD-10-CM | POA: Diagnosis present

## 2017-05-27 DIAGNOSIS — D62 Acute posthemorrhagic anemia: Secondary | ICD-10-CM | POA: Diagnosis present

## 2017-05-27 DIAGNOSIS — Z91041 Radiographic dye allergy status: Secondary | ICD-10-CM

## 2017-05-27 DIAGNOSIS — F1120 Opioid dependence, uncomplicated: Secondary | ICD-10-CM | POA: Diagnosis present

## 2017-05-27 DIAGNOSIS — J45909 Unspecified asthma, uncomplicated: Secondary | ICD-10-CM | POA: Diagnosis present

## 2017-05-27 DIAGNOSIS — F419 Anxiety disorder, unspecified: Secondary | ICD-10-CM | POA: Diagnosis present

## 2017-05-27 DIAGNOSIS — J42 Unspecified chronic bronchitis: Secondary | ICD-10-CM | POA: Diagnosis present

## 2017-05-27 DIAGNOSIS — G8929 Other chronic pain: Secondary | ICD-10-CM | POA: Diagnosis present

## 2017-05-27 DIAGNOSIS — R748 Abnormal levels of other serum enzymes: Secondary | ICD-10-CM | POA: Diagnosis present

## 2017-05-27 DIAGNOSIS — K862 Cyst of pancreas: Secondary | ICD-10-CM | POA: Diagnosis present

## 2017-05-27 DIAGNOSIS — Z8711 Personal history of peptic ulcer disease: Secondary | ICD-10-CM

## 2017-05-27 DIAGNOSIS — F1210 Cannabis abuse, uncomplicated: Secondary | ICD-10-CM | POA: Diagnosis present

## 2017-05-27 DIAGNOSIS — F1010 Alcohol abuse, uncomplicated: Secondary | ICD-10-CM | POA: Diagnosis present

## 2017-05-27 DIAGNOSIS — E869 Volume depletion, unspecified: Secondary | ICD-10-CM | POA: Diagnosis present

## 2017-05-27 DIAGNOSIS — Z91013 Allergy to seafood: Secondary | ICD-10-CM

## 2017-05-27 DIAGNOSIS — Z8249 Family history of ischemic heart disease and other diseases of the circulatory system: Secondary | ICD-10-CM

## 2017-05-27 DIAGNOSIS — M545 Low back pain: Secondary | ICD-10-CM | POA: Diagnosis present

## 2017-05-27 LAB — COMPREHENSIVE METABOLIC PANEL
ALK PHOS: 121 U/L (ref 38–126)
ALT: 40 U/L (ref 17–63)
ANION GAP: 19 — AB (ref 5–15)
AST: 67 U/L — ABNORMAL HIGH (ref 15–41)
Albumin: 4.3 g/dL (ref 3.5–5.0)
BILIRUBIN TOTAL: 1 mg/dL (ref 0.3–1.2)
BUN: 15 mg/dL (ref 6–20)
CALCIUM: 9.2 mg/dL (ref 8.9–10.3)
CO2: 16 mmol/L — ABNORMAL LOW (ref 22–32)
Chloride: 99 mmol/L — ABNORMAL LOW (ref 101–111)
Creatinine, Ser: 0.94 mg/dL (ref 0.61–1.24)
GFR calc non Af Amer: >60 mL/min (ref >60)
Glucose, Bld: 72 mg/dL (ref 65–99)
Potassium: 3.5 mmol/L (ref 3.5–5.1)
Sodium: 134 mmol/L — ABNORMAL LOW (ref 135–145)
TOTAL PROTEIN: 8.3 g/dL — AB (ref 6.5–8.1)

## 2017-05-27 LAB — MAGNESIUM: Magnesium: 1.8 mg/dL (ref 1.7–2.4)

## 2017-05-27 LAB — LIPASE, BLOOD: Lipase: 91 U/L — ABNORMAL HIGH (ref 11–51)

## 2017-05-27 LAB — ETHANOL: Alcohol, Ethyl (B): 25 mg/dL — ABNORMAL HIGH (ref <10)

## 2017-05-27 LAB — URINALYSIS, ROUTINE W REFLEX MICROSCOPIC
Bilirubin Urine: NEGATIVE
Glucose, UA: NEGATIVE mg/dL
HGB URINE DIPSTICK: NEGATIVE
KETONES UR: 80 mg/dL — AB
Leukocytes, UA: NEGATIVE
Nitrite: NEGATIVE
PROTEIN: NEGATIVE mg/dL
SPECIFIC GRAVITY, URINE: 1.023 (ref 1.005–1.030)
pH: 5 (ref 5.0–8.0)

## 2017-05-27 LAB — CBC
HCT: 36.3 % — ABNORMAL LOW (ref 39.0–52.0)
HEMOGLOBIN: 11.8 g/dL — AB (ref 13.0–17.0)
MCH: 28.6 pg (ref 26.0–34.0)
MCHC: 32.5 g/dL (ref 30.0–36.0)
MCV: 87.9 fL (ref 78.0–100.0)
Platelets: 303 10*3/uL (ref 150–400)
RBC: 4.13 MIL/uL — AB (ref 4.22–5.81)
RDW: 18.2 % — ABNORMAL HIGH (ref 11.5–15.5)
WBC: 13.1 10*3/uL — ABNORMAL HIGH (ref 4.0–10.5)

## 2017-05-27 LAB — PHOSPHORUS: Phosphorus: 3.5 mg/dL (ref 2.5–4.6)

## 2017-05-27 MED ORDER — SODIUM CHLORIDE 0.9 % IV BOLUS (SEPSIS)
1000.0000 mL | Freq: Once | INTRAVENOUS | Status: AC
  Administered 2017-05-27: 1000 mL via INTRAVENOUS

## 2017-05-27 MED ORDER — ALBUTEROL SULFATE HFA 108 (90 BASE) MCG/ACT IN AERS: 2.0000 | INHALATION_SPRAY | Freq: Four times a day (QID) | RESPIRATORY_TRACT | Status: DC | PRN

## 2017-05-27 MED ORDER — HYDROMORPHONE HCL 1 MG/ML IJ SOLN
0.5000 mg | INTRAMUSCULAR | Status: DC | PRN
  Administered 2017-05-27 – 2017-05-28 (×5): 0.5 mg via INTRAVENOUS
  Filled 2017-05-27: qty 1
  Filled 2017-05-27: qty 0.5
  Filled 2017-05-27 (×2): qty 1

## 2017-05-27 MED ORDER — NAPHAZOLINE HCL 0.1 % OP SOLN
1.0000 [drp] | Freq: Every day | OPHTHALMIC | Status: DC
  Filled 2017-05-27: qty 15

## 2017-05-27 MED ORDER — DICYCLOMINE HCL 20 MG PO TABS
20.0000 mg | ORAL_TABLET | Freq: Two times a day (BID) | ORAL | Status: DC
  Administered 2017-05-28 – 2017-05-30 (×6): 20 mg via ORAL
  Filled 2017-05-27 (×6): qty 1

## 2017-05-27 MED ORDER — LORAZEPAM 2 MG/ML IJ SOLN
0.0000 mg | Freq: Four times a day (QID) | INTRAMUSCULAR | Status: AC
  Administered 2017-05-27: 1 mg via INTRAVENOUS
  Administered 2017-05-28 – 2017-05-29 (×2): 2 mg via INTRAVENOUS
  Administered 2017-05-29: 1 mg via INTRAVENOUS
  Filled 2017-05-27 (×4): qty 1

## 2017-05-27 MED ORDER — FLUTICASONE PROPIONATE 50 MCG/ACT NA SUSP
1.0000 | Freq: Every day | NASAL | Status: DC | PRN
  Filled 2017-05-27: qty 16

## 2017-05-27 MED ORDER — SODIUM CHLORIDE 0.9 % IV SOLN
8.0000 mg/h | INTRAVENOUS | Status: DC
  Administered 2017-05-27 – 2017-05-30 (×5): 8 mg/h via INTRAVENOUS
  Filled 2017-05-27 (×9): qty 80

## 2017-05-27 MED ORDER — POTASSIUM CHLORIDE 10 MEQ/100ML IV SOLN
10.0000 meq | INTRAVENOUS | Status: AC
  Administered 2017-05-27 – 2017-05-28 (×3): 10 meq via INTRAVENOUS
  Filled 2017-05-27 (×2): qty 100

## 2017-05-27 MED ORDER — VITAMIN B-1 100 MG PO TABS: 100.0000 mg | ORAL_TABLET | Freq: Every day | ORAL | Status: DC

## 2017-05-27 MED ORDER — HYDROMORPHONE HCL 1 MG/ML IJ SOLN
1.0000 mg | Freq: Once | INTRAMUSCULAR | Status: AC
  Administered 2017-05-27: 1 mg via INTRAVENOUS
  Filled 2017-05-27: qty 1

## 2017-05-27 MED ORDER — GABAPENTIN 300 MG PO CAPS
300.0000 mg | ORAL_CAPSULE | Freq: Three times a day (TID) | ORAL | Status: DC
  Administered 2017-05-28 – 2017-05-30 (×8): 300 mg via ORAL
  Filled 2017-05-27 (×8): qty 1

## 2017-05-27 MED ORDER — METOPROLOL TARTRATE 25 MG PO TABS
25.0000 mg | ORAL_TABLET | Freq: Two times a day (BID) | ORAL | Status: DC
  Administered 2017-05-28 – 2017-05-30 (×6): 25 mg via ORAL
  Filled 2017-05-27 (×6): qty 1

## 2017-05-27 MED ORDER — PANCRELIPASE (LIP-PROT-AMYL) 12000-38000 UNITS PO CPEP
24000.0000 [IU] | ORAL_CAPSULE | Freq: Three times a day (TID) | ORAL | Status: DC
  Administered 2017-05-28 – 2017-05-30 (×7): 24000 [IU] via ORAL
  Filled 2017-05-27 (×9): qty 2

## 2017-05-27 MED ORDER — NAPHAZOLINE-PHENIRAMINE 0.025-0.3 % OP SOLN
1.0000 [drp] | Freq: Every day | OPHTHALMIC | Status: DC
  Administered 2017-05-28 – 2017-05-29 (×2): 1 [drp] via OPHTHALMIC
  Filled 2017-05-27: qty 5

## 2017-05-27 MED ORDER — HYDROMORPHONE HCL 1 MG/ML IJ SOLN
0.5000 mg | INTRAMUSCULAR | Status: DC | PRN
  Filled 2017-05-27: qty 1

## 2017-05-27 MED ORDER — ONDANSETRON HCL 4 MG/2ML IJ SOLN
4.0000 mg | Freq: Once | INTRAMUSCULAR | Status: AC
Start: 2017-05-27 — End: 2017-05-27
  Administered 2017-05-27: 4 mg via INTRAVENOUS
  Filled 2017-05-27: qty 2

## 2017-05-27 MED ORDER — POTASSIUM CHLORIDE IN NACL 20-0.9 MEQ/L-% IV SOLN
INTRAVENOUS | Status: DC
  Administered 2017-05-27 – 2017-05-30 (×3): via INTRAVENOUS
  Filled 2017-05-27 (×4): qty 1000

## 2017-05-27 MED ORDER — HYDRALAZINE HCL 25 MG PO TABS
25.0000 mg | ORAL_TABLET | Freq: Every day | ORAL | Status: DC
  Administered 2017-05-28 – 2017-05-29 (×3): 25 mg via ORAL
  Filled 2017-05-27 (×3): qty 1

## 2017-05-27 MED ORDER — SODIUM CHLORIDE 0.9 % IV SOLN
80.0000 mg | Freq: Once | INTRAVENOUS | Status: AC
  Administered 2017-05-27: 80 mg via INTRAVENOUS
  Filled 2017-05-27: qty 80

## 2017-05-27 MED ORDER — LORAZEPAM 2 MG/ML IJ SOLN: 0.0000 mg | Freq: Two times a day (BID) | INTRAMUSCULAR | Status: DC

## 2017-05-27 MED ORDER — HALOPERIDOL LACTATE 5 MG/ML IJ SOLN
2.0000 mg | Freq: Once | INTRAMUSCULAR | Status: AC
Start: 2017-05-27 — End: 2017-05-27
  Administered 2017-05-27: 2 mg via INTRAVENOUS
  Filled 2017-05-27: qty 1

## 2017-05-27 MED ORDER — LORAZEPAM 1 MG PO TABS: 0.0000 mg | ORAL_TABLET | Freq: Two times a day (BID) | ORAL | Status: DC

## 2017-05-27 MED ORDER — THIAMINE HCL 100 MG/ML IJ SOLN
100.0000 mg | Freq: Every day | INTRAMUSCULAR | Status: DC
  Administered 2017-05-27: 100 mg via INTRAVENOUS
  Filled 2017-05-27: qty 2

## 2017-05-27 MED ORDER — KETOROLAC TROMETHAMINE 30 MG/ML IJ SOLN
30.0000 mg | Freq: Once | INTRAMUSCULAR | Status: AC
  Administered 2017-05-27: 30 mg via INTRAVENOUS
  Filled 2017-05-27: qty 1

## 2017-05-27 MED ORDER — LORAZEPAM 1 MG PO TABS
0.0000 mg | ORAL_TABLET | Freq: Four times a day (QID) | ORAL | Status: AC
  Administered 2017-05-28: 1 mg via ORAL
  Filled 2017-05-27 (×3): qty 1

## 2017-05-27 MED ORDER — PANTOPRAZOLE SODIUM 40 MG IV SOLR
40.0000 mg | Freq: Two times a day (BID) | INTRAVENOUS | Status: DC
  Filled 2017-05-27: qty 40

## 2017-05-27 MED ORDER — SERTRALINE HCL 50 MG PO TABS
75.0000 mg | ORAL_TABLET | Freq: Every day | ORAL | Status: DC
  Administered 2017-05-28 – 2017-05-30 (×4): 75 mg via ORAL
  Filled 2017-05-27: qty 1
  Filled 2017-05-27 (×2): qty 2
  Filled 2017-05-27: qty 1
  Filled 2017-05-27: qty 2

## 2017-05-27 MED ORDER — GI COCKTAIL ~~LOC~~
30.0000 mL | Freq: Two times a day (BID) | ORAL | Status: DC | PRN
  Administered 2017-05-27 – 2017-05-30 (×5): 30 mL via ORAL
  Filled 2017-05-27 (×9): qty 30

## 2017-05-27 MED ORDER — LORATADINE 10 MG PO TABS
10.0000 mg | ORAL_TABLET | Freq: Every day | ORAL | Status: DC
  Administered 2017-05-28 – 2017-05-30 (×4): 10 mg via ORAL
  Filled 2017-05-27 (×4): qty 1

## 2017-05-27 MED ORDER — PANTOPRAZOLE SODIUM 40 MG IV SOLR: 40.0000 mg | Freq: Two times a day (BID) | INTRAVENOUS | Status: DC

## 2017-05-27 MED ORDER — GI COCKTAIL ~~LOC~~
30.0000 mL | Freq: Once | ORAL | Status: AC
  Administered 2017-05-27: 30 mL via ORAL
  Filled 2017-05-27: qty 30

## 2017-05-27 MED ORDER — M.V.I. ADULT IV INJ
INJECTION | Freq: Every day | INTRAVENOUS | Status: AC
  Administered 2017-05-27 – 2017-05-29 (×3): via INTRAVENOUS
  Filled 2017-05-27 (×5): qty 1000

## 2017-05-27 NOTE — ED Notes (Signed)
Patient called x4 10-15 min apart for re-eval and room placement. No answer. Seen by [employer] and security waling out of facility. They walked outside to find pt and did not see him

## 2017-05-27 NOTE — ED Notes (Signed)
No answer from pt in waiting room 

## 2017-05-27 NOTE — ED Triage Notes (Signed)
Patient arrived by [facility] for ongoing LUQ  Pain with nausea and vomiting that he states is related to his pancreatitis. Patient here yesterday for same and left prior to being seen and discharged. Alert on arrival. NAD

## 2017-05-27 NOTE — ED Provider Notes (Signed)
[facility] EMERGENCY DEPARTMENT Provider Note   CSN: [id number] Arrival date & time: 05/27/17  1423     History   Chief Complaint No chief complaint on file.   HPI [name] is a 47 y.o. male who is well known to the emergency department with history of alcohol abuse, chronic abdominal pain and pancreatitis who presents with acute on chronic left upper quadrant pain, nausea, vomiting, and diarrhea.  Patient reports this flare is typical of his normal, however he did have some episodes of hematemesis, which scared him.  He has taken Goody powders at home without relief.  He reports he is in the process of trying to obtain a PCP.  He is seeing gastroenterology and has a scheduled endoscopy for further workup.  He denies any fevers.  HPI  Past Medical History:  Diagnosis Date  . Alcoholism /alcohol abuse ([place])   . Anemia   . Anxiety   . Arthritis    "knees; arms; elbows" (03/26/2015)  . Asthma   . Bipolar disorder ([place])   . Chronic bronchitis ([place])   . Chronic lower back pain   . Cocaine abuse ([place])   . Depression   . Family history of adverse reaction to anesthesia    "grandmother gets confused"  . Femoral condyle fracture ([place]) 03/08/2014   left medial/notes 03/09/2014  . GERD (gastroesophageal reflux disease)   . H/O hiatal hernia   . H/O suicide attempt 10/2012  . Heart murmur    "when he was little" (03/06/2013)  . High cholesterol   . History of blood transfusion 10/2012   "when I tried to commit suicide"  . History of stomach ulcers   . Hypertension   . Marijuana abuse, continuous   . Migraine    "a few times/year" (03/26/2015)  . Pancreatitis   . Pneumonia 1990's X 3  . PTSD (post-traumatic stress disorder)   . Shortness of breath    "can happen at anytime" (03/06/2013)  . Sickle cell trait ([place])   . WPW (Wolff-Parkinson-White syndrome)    [name] 03/06/2013    Patient Active Problem List   Diagnosis Date Noted  . Acute on  chronic pancreatitis ([place]) 12/17/2016  . Chronic alcoholic pancreatitis ([place]) 12/05/2016  . Intractable nausea and vomiting 12/05/2016  . Verbally abusive behavior 12/05/2016  . Normocytic anemia 12/05/2016  . Alcohol use disorder, severe, dependence ([place]) 07/25/2016  . Cocaine use disorder, severe, dependence ([place]) 07/25/2016  . Major depressive disorder, recurrent severe without psychotic features ([place]) 07/20/2016  . Leukocytosis   . Hospital acquired PNA 05/20/2015  . Pancreatitis 05/18/2015  . Pseudocyst of pancreas 05/18/2015  . Polysubstance abuse (tobacco, cocaine, THC, and ETOH) 03/26/2015  . Alcohol-induced chronic pancreatitis ([place])   . Benign essential HTN 02/06/2014  . Alcohol-induced acute pancreatitis 11/28/2013  . Pancreatic pseudocyst/cyst 11/25/2013  . MDD (major depressive disorder), recurrent severe, without psychosis ([place]) 10/22/2013  . Severe protein-calorie malnutrition ([place]) 10/10/2013  . Suicide attempt ([place]) 10/08/2013  . 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 TOBACCO ABUSE 03/23/2007    Past Surgical History:  Procedure Laterality Date  . CARDIAC CATHETERIZATION    . EYE SURGERY Left 1990's   "result of trauma"   . FACIAL FRACTURE SURGERY Left 1990's   "result of trauma"   . FRACTURE SURGERY    . HERNIA REPAIR    . LEFT HEART CATHETERIZATION WITH CORONARY ANGIOGRAM Right 03/07/2013   Procedure: LEFT HEART CATHETERIZATION WITH CORONARY ANGIOGRAM;  Surgeon: [name], MD;  Location: [facility] CATH LAB;  Service: Cardiovascular;  Laterality: Right;  . UMBILICAL HERNIA REPAIR         Home Medications    Prior to Admission medications   Medication Sig Start Date End Date Taking? Authorizing Provider  albuterol (PROVENTIL HFA;VENTOLIN HFA) 108 (90 Base) MCG/ACT inhaler Inhale 2 puffs into the lungs every 6 (six) hours as needed for wheezing or shortness of breath. 07/25/16   Lindell Spar I, NP  Alum & Mag Hydroxide-Simeth  (GI COCKTAIL) SUSP suspension Take 30 mLs by mouth 2 (two) times daily as needed for indigestion. Shake well. 03/05/17   Doristine Devoid, PA-C  cholestyramine (QUESTRAN) 4 g packet Take 1 packet (4 g total) by mouth 3 (three) times daily as needed (diarrhea). Patient not taking: Reported on 05/22/2017 07/25/16   Lindell Spar I, NP  dicyclomine (BENTYL) 20 MG tablet Take 1 tablet (20 mg total) by mouth 2 (two) times daily. 03/05/17   Doristine Devoid, PA-C  fluticasone (FLONASE) 50 MCG/ACT nasal spray Place 1 spray into both nostrils daily as needed for rhinitis. 07/25/16   Lindell Spar I, NP  folic acid (FOLVITE) 1 MG tablet Take 1 tablet (1 mg total) by mouth daily. 01/20/17   Georgette Shell, MD  gabapentin (NEURONTIN) 300 MG capsule Take 1 capsule (300 mg total) by mouth 3 (three) times daily. For agitation 07/25/16   Lindell Spar I, NP  hydrALAZINE (APRESOLINE) 25 MG tablet Take 1 tablet (25 mg total) by mouth every 8 (eight) hours. Patient taking differently: Take 25 mg by mouth at bedtime.  12/29/16   Barton Dubois, MD  lipase/protease/amylase (CREON) 12000 units CPEP capsule Take 2 capsules (24,000 Units total) by mouth 3 (three) times daily with meals. For pancreatitis 12/29/16   Barton Dubois, MD  loperamide (IMODIUM) 2 MG capsule Take 1 capsule (2 mg total) by mouth as needed for diarrhea or loose stools. 01/19/17   Georgette Shell, MD  loratadine (CLARITIN) 10 MG tablet Take 1 tablet (10 mg total) by mouth daily. (May purchase from over the counter): For allergies 07/26/16   Lindell Spar I, NP  metoprolol tartrate (LOPRESSOR) 25 MG tablet Take 1 tablet (25 mg total) by mouth 2 (two) times daily. For high blood pressure 12/29/16   Barton Dubois, MD  Multiple Vitamin (MULTIVITAMIN WITH MINERALS) TABS tablet Take 1 tablet by mouth daily. 01/20/17   Georgette Shell, MD  omeprazole (PRILOSEC) 20 MG capsule Take 20 mg by mouth daily.    [provider]  ondansetron (ZOFRAN)  4 MG tablet Take 1 tablet (4 mg total) every 6 (six) hours by mouth. Patient taking differently: Take 4 mg by mouth every 6 (six) hours as needed for nausea or vomiting.  03/18/17   Malvin Johns, MD  Oxycodone HCl 10 MG TABS Take 1 tablet (10 mg total) by mouth every 4 (four) hours as needed. Patient not taking: Reported on 05/22/2017 01/25/17   Julianne Rice, MD  pantoprazole (PROTONIX) 40 MG tablet Take 1 tablet (40 mg total) by mouth daily. 12/29/16   Barton Dubois, MD  sertraline (ZOLOFT) 25 MG tablet Take 3 tablets (75 mg total) by mouth daily. For depression 07/26/16   Lindell Spar I, NP  tetrahydrozoline 0.05 % ophthalmic solution Place 1 drop into both eyes daily as needed (itching).    [provider]  thiamine 100 MG tablet Take 1 tablet (100 mg total) by mouth daily. Patient not taking:  Reported on 02/12/2017 12/30/16   Barton Dubois, MD    Family History Family History  Problem Relation Age of Onset  . Hypertension Mother   . Hypertension Father   . Hypertension Other   . Coronary artery disease Other     Social History Social History   Tobacco Use  . Smoking status: Current Every Day Smoker    Packs/day: 1.00    Types: Cigarettes  . Smokeless tobacco: Never Used  Substance Use Topics  . Alcohol use: Yes  . Drug use: Yes    Types: Marijuana     Allergies   Robaxin [methocarbamol]; Shellfish-derived products; Trazodone and nefazodone; Contrast media [iodinated diagnostic agents]; and Reglan [metoclopramide]   Review of Systems Review of Systems  Constitutional: Negative for chills and fever.  HENT: Negative for facial swelling and sore throat.   Respiratory: Negative for shortness of breath.   Cardiovascular: Negative for chest pain.  Gastrointestinal: Positive for abdominal pain, diarrhea, nausea and vomiting. Negative for blood in stool.  Genitourinary: Negative for dysuria.  Musculoskeletal: Negative for back pain.  Skin: Negative for rash and  wound.  Neurological: Negative for headaches.  Psychiatric/Behavioral: The patient is not nervous/anxious.      Physical Exam Updated Vital Signs BP (!) 141/101   Pulse (!) 120   Physical Exam  Constitutional: He appears well-developed and well-nourished. No distress.  HENT:  Head: Normocephalic and atraumatic.  Mouth/Throat: Oropharynx is clear and moist. No oropharyngeal exudate.  Eyes: Conjunctivae are normal. Pupils are equal, round, and reactive to light. Right eye exhibits no discharge. Left eye exhibits no discharge. No scleral icterus.  Neck: Normal range of motion. Neck supple. No thyromegaly present.  Cardiovascular: Regular rhythm, normal heart sounds and intact distal pulses. Tachycardia present. Exam reveals no gallop and no friction rub.  No murmur heard. Pulmonary/Chest: Effort normal and breath sounds normal. No stridor. No respiratory distress. He has no wheezes. He has no rales.  Abdominal: Soft. Bowel sounds are normal. He exhibits no distension. There is tenderness in the left upper quadrant. There is no rebound and no guarding.  Musculoskeletal: He exhibits no edema.  Lymphadenopathy:    He has no cervical adenopathy.  Neurological: He is alert. Coordination normal.  Skin: Skin is warm and dry. No rash noted. He is not diaphoretic. No pallor.  Psychiatric: He has a normal mood and affect.  Nursing note and vitals reviewed.    ED Treatments / Results  Labs (all labs ordered are listed, but only abnormal results are displayed) Labs Reviewed  URINALYSIS, ROUTINE W REFLEX MICROSCOPIC - Abnormal; Notable for the following components:      Result Value   Ketones, ur 80 (*)    All other components within normal limits  LIPASE, BLOOD  COMPREHENSIVE METABOLIC PANEL  CBC  ETHANOL    EKG  EKG Interpretation None       Radiology No results found.  Procedures Procedures (including critical care time)  Medications Ordered in ED Medications    LORazepam (ATIVAN) injection 0-4 mg (not administered)    Or  LORazepam (ATIVAN) tablet 0-4 mg (not administered)  LORazepam (ATIVAN) injection 0-4 mg (not administered)    Or  LORazepam (ATIVAN) tablet 0-4 mg (not administered)  thiamine (VITAMIN B-1) tablet 100 mg (not administered)    Or  thiamine (B-1) injection 100 mg (not administered)  haloperidol lactate (HALDOL) injection 2 mg (2 mg Intravenous Given 05/27/17 1541)  ondansetron (ZOFRAN) injection 4 mg (4 mg Intravenous Given  05/27/17 1541)  gi cocktail (Maalox,Lidocaine,Donnatal) (30 mLs Oral Given 05/27/17 1534)  ketorolac (TORADOL) 30 MG/ML injection 30 mg (30 mg Intravenous Given 05/27/17 1541)  sodium chloride 0.9 % bolus 1,000 mL (1,000 mLs Intravenous New Bag/Given 05/27/17 1551)     Initial Impression / Assessment and Plan / ED Course  I have reviewed the triage vital signs and the nursing notes.  Pertinent labs & imaging results that were available during my care of the patient were reviewed by me and considered in my medical decision making (see chart for details).  Clinical Course as of May 27 1612  Sat May 27, 2017  1611 Labs are pending.  On reevaluation after Tylenol, Zofran, Toradol, GI cocktail, patient states his symptoms are improved, however does still have some abdominal pain.  Fluids are infusing.  [AL]    Clinical Course User Index [AL] Frederica Kuster, PA-C    Patient with chronic abdominal pain and new onset hematemesis.  Labs are pending.  Urine shows 80 ketones.  Fluids infusing.  Patient becoming tachycardic and states he is feeling somewhat anxious.  He denies any hallucinations or seizure activity.  CIWA protocol initiated.  Alcohol cessation discussed.  Patient reports he tried to check into [facility], however they would not accept the patient because of his history of WPW.  At shift change, patient care transferred to Dr. Reather Converse for continued evaluation, follow up of labs, pain control, and alcohol  withdrawal and determination of disposition.   Final Clinical Impressions(s) / ED Diagnoses   Final diagnoses:  Chronic abdominal pain  Hematemesis with nausea    ED Discharge Orders    None           Frederica Kuster, PA-C 05/27/17 1614    Elnora Morrison, MD 05/27/17 [phone]

## 2017-05-27 NOTE — ED Notes (Signed)
Pt up to restroom with IV pole, steady gait noted; pt continues to ask for more pain medication, admitting MD aware

## 2017-05-27 NOTE — H&P (Signed)
History and Physical  [name] WUJ:[id number] DOB: 15-Apr-1971 DOA: 05/27/2017  Referring physician: ER Physician PCP: Marliss Coots, NP  Outpatient Specialists:    Patient coming from: Home  Chief Complaint: Abdominal pain  HPI: Patient is a 47 year old African American male that is well known to the ER staff. Patient caries diagnosis of chronic pancreatitis with pancreatic cyst, alcohol abuse, HTN, WPW, cocaine abuse, GERD and Bipolar. Patient tells me that he relapsed into abusing alcohol again about a week ago because his [place] was having seizures. Patient's last alcohol drink was yesterday. According to the patient, he drinks beer and liquor. Patient developed abdominal pain, nausea, vomiting and diarrhea last night according to the patient. Abdominal pain is vague, but deemed to mainly left upper abdominal area. Patient also reported vomiting bright red blood on 2 occasions, the last being this morning. Diarrheal  Stool is non bloody. Lipase is noted to be elevated. Patient is volume deplete, with potassium of 3.5, CO2 of 16 with anion gap of 19. There is documented history of Gowdy powder use.  No headache, no neck pain, no fever or chills, no urinary symptoms.  ED Course: Volume resuscitation.   Pertinent labs: As above EKG: Independently reviewed.  Review of Systems: As in HPI. 12 systems were reviewed.  Past Medical History:  Diagnosis Date  . Alcoholism /alcohol abuse ([place])   . Anemia   . Anxiety   . Arthritis    "knees; arms; elbows" (03/26/2015)  . Asthma   . Bipolar disorder ([place])   . Chronic bronchitis ([place])   . Chronic lower back pain   . Cocaine abuse ([place])   . Depression   . Family history of adverse reaction to anesthesia    "grandmother gets confused"  . Femoral condyle fracture ([place]) 03/08/2014   left medial/notes 03/09/2014  . GERD (gastroesophageal reflux disease)   . H/O hiatal hernia   . H/O suicide attempt 10/2012  . Heart murmur      "when he was little" (03/06/2013)  . High cholesterol   . History of blood transfusion 10/2012   "when I tried to commit suicide"  . History of stomach ulcers   . Hypertension   . Marijuana abuse, continuous   . Migraine    "a few times/year" (03/26/2015)  . Pancreatitis   . Pneumonia 1990's X 3  . PTSD (post-traumatic stress disorder)   . Shortness of breath    "can happen at anytime" (03/06/2013)  . Sickle cell trait ([place])   . WPW (Wolff-Parkinson-White syndrome)    [name] 03/06/2013    Past Surgical History:  Procedure Laterality Date  . CARDIAC CATHETERIZATION    . EYE SURGERY Left 1990's   "result of trauma"   . FACIAL FRACTURE SURGERY Left 1990's   "result of trauma"   . FRACTURE SURGERY    . HERNIA REPAIR    . LEFT HEART CATHETERIZATION WITH CORONARY ANGIOGRAM Right 03/07/2013   Procedure: LEFT HEART CATHETERIZATION WITH CORONARY ANGIOGRAM;  Surgeon: [name], MD;  Location: [facility];  Service: Cardiovascular;  Laterality: Right;  . UMBILICAL HERNIA REPAIR       reports that he has been smoking cigarettes.  He has been smoking about 1.00 pack per day. he has never used smokeless tobacco. He reports that he drinks alcohol. He reports that he uses drugs. Drug: Marijuana.  Allergies  Allergen Reactions  . Robaxin [Methocarbamol] Other (See Comments)    "jumpy limbs"  . Shellfish-Derived  Products Nausea And Vomiting  . Trazodone And Nefazodone Other (See Comments)    Reaction:  Muscle spasms  . Contrast Media [Iodinated Diagnostic Agents] Hives  . Reglan [Metoclopramide] Other (See Comments)    Reaction:  Muscle spasms    Family History  Problem Relation Age of Onset  . Hypertension Mother   . Hypertension Father   . Hypertension Other   . Coronary artery disease Other      Prior to Admission medications   Medication Sig Start Date End Date Taking? Authorizing Provider  albuterol (PROVENTIL HFA;VENTOLIN HFA) 108 (90 Base) MCG/ACT inhaler Inhale  2 puffs into the lungs every 6 (six) hours as needed for wheezing or shortness of breath. 07/25/16   Lindell Spar I, NP  Alum & Mag Hydroxide-Simeth (GI COCKTAIL) SUSP suspension Take 30 mLs by mouth 2 (two) times daily as needed for indigestion. Shake well. 03/05/17   Doristine Devoid, PA-C  dicyclomine (BENTYL) 20 MG tablet Take 1 tablet (20 mg total) by mouth 2 (two) times daily. 03/05/17   Doristine Devoid, PA-C  fluticasone (FLONASE) 50 MCG/ACT nasal spray Place 1 spray into both nostrils daily as needed for rhinitis. 07/25/16   Lindell Spar I, NP  folic acid (FOLVITE) 1 MG tablet Take 1 tablet (1 mg total) by mouth daily. 01/20/17   Georgette Shell, MD  gabapentin (NEURONTIN) 300 MG capsule Take 1 capsule (300 mg total) by mouth 3 (three) times daily. For agitation 07/25/16   Lindell Spar I, NP  hydrALAZINE (APRESOLINE) 25 MG tablet Take 1 tablet (25 mg total) by mouth every 8 (eight) hours. Patient taking differently: Take 25 mg by mouth at bedtime.  12/29/16   Barton Dubois, MD  lipase/protease/amylase (CREON) 12000 units CPEP capsule Take 2 capsules (24,000 Units total) by mouth 3 (three) times daily with meals. For pancreatitis 12/29/16   Barton Dubois, MD  loperamide (IMODIUM) 2 MG capsule Take 1 capsule (2 mg total) by mouth as needed for diarrhea or loose stools. 01/19/17   Georgette Shell, MD  loratadine (CLARITIN) 10 MG tablet Take 1 tablet (10 mg total) by mouth daily. (May purchase from over the counter): For allergies 07/26/16   Lindell Spar I, NP  metoprolol tartrate (LOPRESSOR) 25 MG tablet Take 1 tablet (25 mg total) by mouth 2 (two) times daily. For high blood pressure 12/29/16   Barton Dubois, MD  Multiple Vitamin (MULTIVITAMIN WITH MINERALS) TABS tablet Take 1 tablet by mouth daily. 01/20/17   Georgette Shell, MD  omeprazole (PRILOSEC) 20 MG capsule Take 20 mg by mouth daily.    [provider]  ondansetron (ZOFRAN) 4 MG tablet Take 1 tablet (4 mg total)  every 6 (six) hours by mouth. Patient taking differently: Take 4 mg by mouth every 6 (six) hours as needed for nausea or vomiting.  03/18/17   Malvin Johns, MD  Oxycodone HCl 10 MG TABS Take 1 tablet (10 mg total) by mouth every 4 (four) hours as needed. Patient not taking: Reported on 05/22/2017 01/25/17   Julianne Rice, MD  pantoprazole (PROTONIX) 40 MG tablet Take 1 tablet (40 mg total) by mouth daily. 12/29/16   Barton Dubois, MD  sertraline (ZOLOFT) 25 MG tablet Take 3 tablets (75 mg total) by mouth daily. For depression 07/26/16   Lindell Spar I, NP  tetrahydrozoline 0.05 % ophthalmic solution Place 1 drop into both eyes daily as needed (itching).    [provider]  thiamine 100 MG tablet Take 1  tablet (100 mg total) by mouth daily. Patient not taking: Reported on 02/12/2017 12/30/16   Barton Dubois, MD    Physical Exam: Vitals:   05/27/17 1547 05/27/17 1626 05/27/17 1757  BP: (!) 141/101 (!) 141/84 (!) 154/102  Pulse: (!) 120 (!) 118 (!) 113  Resp:  20 18  SpO2:  98% 100%    Constitutional:  . Appears calm and comfortable. Not in any distress. Eyes:  [name] Mild pallor. No jaundice.  ENMT:  . external ears, nose appear normal. Dry buccal mucosa. Neck:  . Neck is supple. No JVD Respiratory:  . CTA bilaterally, no w/r/r.  . Respiratory effort normal. No retractions or accessory muscle use Cardiovascular:  . S1S2 . No LE extremity edema   Abdomen:  . Abdomen is soft and vaguely tender. Organs are difficult to assess. Neurologic:  . Awake and alert. . Moves all limbs.  Wt Readings from Last 3 Encounters:  05/22/17 63.5 kg (140 lb)  03/09/17 63.5 kg (140 lb)  03/05/17 63.5 kg (140 lb)    Labs on Admission:  CBC: Recent Labs  Lab 05/22/17 0727 05/26/17 2150 05/27/17 1545  WBC 6.8 15.1* 13.1*  HGB 12.6* 11.2* 11.8*  HCT 39.1 33.1* 36.3*  MCV 89.3 86.6 87.9  PLT 272 283 578   Basic Metabolic Panel: Recent Labs  Lab 05/22/17 0727 05/26/17 2150  05/27/17 1545  NA 136 139 134*  K 4.3 3.6 3.5  CL 105 100* 99*  CO2 24 18* 16*  GLUCOSE 103* 69 72  BUN 6 10 15   CREATININE 0.61 1.17 0.94  CALCIUM 9.4 9.2 9.2   Liver Function Tests: Recent Labs  Lab 05/22/17 0727 05/26/17 2150 05/27/17 1545  AST 50* 53* 67*  ALT 40 39 40  ALKPHOS 108 111 121  BILITOT 0.6 0.7 1.0  PROT 8.6* 7.9 8.3*  ALBUMIN 4.4 4.1 4.3   Recent Labs  Lab 05/22/17 0727 05/26/17 2150 05/27/17 1545  LIPASE 22 43 91*   No results for input(s): AMMONIA in the last 168 hours. Coagulation Profile: No results for input(s): INR, PROTIME in the last 168 hours. Cardiac Enzymes: No results for input(s): CKTOTAL, CKMB, CKMBINDEX, TROPONINI in the last 168 hours. BNP (last 3 results) No results for input(s): PROBNP in the last 8760 hours. HbA1C: No results for input(s): HGBA1C in the last 72 hours. CBG: No results for input(s): GLUCAP in the last 168 hours. Lipid Profile: No results for input(s): CHOL, HDL, LDLCALC, TRIG, CHOLHDL, LDLDIRECT in the last 72 hours. Thyroid Function Tests: No results for input(s): TSH, T4TOTAL, FREET4, T3FREE, THYROIDAB in the last 72 hours. Anemia Panel: No results for input(s): VITAMINB12, FOLATE, FERRITIN, TIBC, IRON, RETICCTPCT in the last 72 hours. Urine analysis:    Component Value Date/Time   COLORURINE YELLOW 05/27/2017 [place] 05/27/2017 1520   LABSPEC 1.023 05/27/2017 1520   PHURINE 5.0 05/27/2017 1520   GLUCOSEU NEGATIVE 05/27/2017 1520   HGBUR NEGATIVE 05/27/2017 1520   HGBUR negative 04/30/2010 1020   BILIRUBINUR NEGATIVE 05/27/2017 1520   KETONESUR 80 (A) 05/27/2017 1520   PROTEINUR NEGATIVE 05/27/2017 1520   UROBILINOGEN 0.2 03/15/2015 0508   NITRITE NEGATIVE 05/27/2017 1520   LEUKOCYTESUR NEGATIVE 05/27/2017 1520   Sepsis Labs: @LABRCNTIP (procalcitonin:4,lacticidven:4) )No results found for this or any previous visit (from the past 240 hour(s)).    Radiological Exams on  Admission: No results found.  EKG: Independently reviewed.   Active Problems:   Abdominal pain   Assessment/Plan 1. Abdominal  pain 2. Nausea and vomiting 3. Diarrhea 4. Hematemesis 5. Elevated lipase 6. Possible acute on chronic pancreatitis 7. Volume depletion 8. Hypokalemia 9. Alcohol abuse, continuous 10. Anemia, chronic, with likely acute blood loss anemia   Admit patient  CIWA Protocol  Hydrate patient  Correct abnormal electrolytes  Check magnesium and phosphorus  Monitor renal function  Protonix drip  Monitor H/H  Check lactic acid level  Counsel patient re alcohol abuse  Optimize blood pressure  Low threshold to consult GI  Further management will depend on hospital course  DVT prophylaxis: SCD Code Status: Full Family Communication:  Disposition Plan: Home eventually   Consults called: Low threshold to consult GI   Admission status: Inpatient    Time spent: 60 minutes  Dana Allan, MD  [facility] Pager #: [phone] 7PM-7AM contact night coverage as above   05/27/2017, 7:35 PM

## 2017-05-27 NOTE — ED Notes (Signed)
Pt well known to ED-- was here this am, waited 9+ hours, hx of pancreatitis -- pt states that he has started drinking again on a regular basis.

## 2017-05-28 DIAGNOSIS — G8929 Other chronic pain: Secondary | ICD-10-CM

## 2017-05-28 LAB — HEMOGLOBIN AND HEMATOCRIT, BLOOD
HCT: 31.6 % — ABNORMAL LOW (ref 39.0–52.0)
HCT: 30.8 % — ABNORMAL LOW (ref 39.0–52.0)
Hemoglobin: 10 g/dL — ABNORMAL LOW (ref 13.0–17.0)
Hemoglobin: 9.8 g/dL — ABNORMAL LOW (ref 13.0–17.0)

## 2017-05-28 LAB — COMPREHENSIVE METABOLIC PANEL
ALT: 28 U/L (ref 17–63)
AST: 43 U/L — ABNORMAL HIGH (ref 15–41)
Albumin: 3.1 g/dL — ABNORMAL LOW (ref 3.5–5.0)
Alkaline Phosphatase: 85 U/L (ref 38–126)
Anion gap: 11 (ref 5–15)
BUN: 7 mg/dL (ref 6–20)
CO2: 17 mmol/L — ABNORMAL LOW (ref 22–32)
Calcium: 8.1 mg/dL — ABNORMAL LOW (ref 8.9–10.3)
Chloride: 108 mmol/L (ref 101–111)
Creatinine, Ser: 0.66 mg/dL (ref 0.61–1.24)
GFR calc Af Amer: >60 mL/min (ref >60)
GFR calc non Af Amer: >60 mL/min (ref >60)
Glucose, Bld: 77 mg/dL (ref 65–99)
Potassium: 3.9 mmol/L (ref 3.5–5.1)
Sodium: 136 mmol/L (ref 135–145)
Total Bilirubin: 1 mg/dL (ref 0.3–1.2)
Total Protein: 6.1 g/dL — ABNORMAL LOW (ref 6.5–8.1)

## 2017-05-28 LAB — CBC
HCT: 28.8 % — ABNORMAL LOW (ref 39.0–52.0)
Hemoglobin: 9.5 g/dL — ABNORMAL LOW (ref 13.0–17.0)
MCH: 29.1 pg (ref 26.0–34.0)
MCHC: 33 g/dL (ref 30.0–36.0)
MCV: 88.1 fL (ref 78.0–100.0)
Platelets: 233 10*3/uL (ref 150–400)
RBC: 3.27 MIL/uL — ABNORMAL LOW (ref 4.22–5.81)
RDW: 18.5 % — ABNORMAL HIGH (ref 11.5–15.5)
WBC: 10.3 10*3/uL (ref 4.0–10.5)

## 2017-05-28 LAB — MAGNESIUM: Magnesium: 1.8 mg/dL (ref 1.7–2.4)

## 2017-05-28 LAB — PHOSPHORUS: Phosphorus: 2.5 mg/dL (ref 2.5–4.6)

## 2017-05-28 LAB — LIPASE, BLOOD: Lipase: 63 U/L — ABNORMAL HIGH (ref 11–51)

## 2017-05-28 LAB — CBG MONITORING, ED: Glucose-Capillary: 69 mg/dL (ref 65–99)

## 2017-05-28 MED ORDER — DEXTROSE 50 % IV SOLN
INTRAVENOUS | Status: AC
  Administered 2017-05-28: 50 mL
  Filled 2017-05-28: qty 50

## 2017-05-28 MED ORDER — HYDROMORPHONE HCL 1 MG/ML IJ SOLN
0.5000 mg | INTRAMUSCULAR | Status: DC | PRN
  Administered 2017-05-28 – 2017-05-30 (×8): 0.5 mg via INTRAVENOUS
  Filled 2017-05-28 (×11): qty 0.5

## 2017-05-28 MED ORDER — ALBUTEROL SULFATE (2.5 MG/3ML) 0.083% IN NEBU: 2.5000 mg | INHALATION_SOLUTION | Freq: Four times a day (QID) | RESPIRATORY_TRACT | Status: DC | PRN

## 2017-05-28 MED ORDER — POTASSIUM CHLORIDE 10 MEQ/100ML IV SOLN
INTRAVENOUS | Status: AC
  Administered 2017-05-28: 10 meq via INTRAVENOUS
  Filled 2017-05-28: qty 100

## 2017-05-28 MED ORDER — PNEUMOCOCCAL VAC POLYVALENT 25 MCG/0.5ML IJ INJ
0.5000 mL | INJECTION | INTRAMUSCULAR | Status: DC
  Filled 2017-05-28: qty 0.5

## 2017-05-28 NOTE — ED Notes (Signed)
Attempted to call report to 3 [place] to [place], Therapist, sports. Reported she did not have a pad, and requested RN's number to return call for report. Stated she will call back in 10 min.

## 2017-05-28 NOTE — Progress Notes (Signed)
Patient due for ativan per CIWA protocol. Entering the room, patient sound asleep, remained asleep during vital signs. I withheld ativan. Patient had IV dilaudid at 2135. Paged on call and informed him of this. Bed alarm is on. Safety maintained. Will continue to monitor.

## 2017-05-28 NOTE — Progress Notes (Signed)
Pt arrived to unit

## 2017-05-28 NOTE — Progress Notes (Signed)
PROGRESS NOTE    [name]  YQI:[id number] DOB: [birth year] DOA: 05/27/2017 PCP: Marliss Coots, NP  Brief Narrative:47 year old African American male that is well known to the ER staff. Patient caries diagnosis of chronic pancreatitis with pancreatic cyst, alcohol abuse, HTN, WPW, cocaine abuse, GERD and Bipolar. Patient tells me that he relapsed into abusing alcohol again about a week ago because his [place] was having seizures. Patient's last alcohol drink was yesterday. According to the patient, he drinks beer and liquor. Patient developed abdominal pain, nausea, vomiting and diarrhea last night according to the patient. Abdominal pain is vague, but deemed to mainly left upper abdominal area. Patient also reported vomiting bright red blood on 2 occasions, the last being this morning. Diarrheal  Stool is non bloody. Lipase is noted to be elevated. Patient is volume deplete, with potassium of 3.5, CO2 of 16 with anion gap of 19. There is documented history of Gowdy powder use.    Assessment & Plan:   Active Problems:   Abdominal pain   Hematemesis  1] chronic abdominal pain with a history of chronic pancreatitis-normal lipase-patient on Dilaudid every 3 hrs 0.5 mg at this time.  He is requesting more pain medications but he was hardly awake when I was talking to him not able to have a conversation and falling asleep while speaking to me.  We decreased the dose of Dilaudid.  2] alcohol abuse 10 years ciwa  protocol  3] chronic narcotic dependence   4] history of cocaine abuse   DVT prophylaxis: scd Code Status: full Family Communication none Disposition Plan:tbd Consultants: none  Procedures:none Antimicrobials: none Subjective: Sleeping while talking to me  Objective: Vitals:   05/28/17 0815 05/28/17 0900 05/28/17 0915 05/28/17 1031  BP: 140/90 140/84 (!) 119/97 (!) 157/97  Pulse: 75 66 95 93  Resp: 17 10 14 16   Temp:    (!) 97.5 F (36.4 C)  TempSrc:     Oral  SpO2: 100% 100% 100% 100%    Intake/Output Summary (Last 24 hours) at 05/28/2017 1301 Last data filed at 05/28/2017 0415 Gross per 24 hour  Intake 2300 ml  Output 800 ml  Net 1500 ml   There were no vitals filed for this visit.  Examination:  General exam: Appears calm and comfortable  Respiratory system: Clear to auscultation. Respiratory effort normal. Cardiovascular system: S1 & S2 heard, RRR. No JVD, murmurs, rubs, gallops or clicks. No pedal edema. Gastrointestinal system: Abdomen is nondistended, soft and nontender. No organomegaly or masses felt. Normal bowel sounds heard. Central nervous system: Alert and oriented. No focal neurological deficits. Extremities: Symmetric 5 x 5 power. Skin: No rashes, lesions or ulcers Psychiatry: Judgement and insight appear normal. Mood & affect appropriate.     Data Reviewed: I have personally reviewed following labs and imaging studies  CBC: Recent Labs  Lab 05/22/17 0727 05/26/17 2150 05/27/17 1545 05/28/17 0351 05/28/17 1148  WBC 6.8 15.1* 13.1* 10.3  --   HGB 12.6* 11.2* 11.8* 9.5* 10.0*  HCT 39.1 33.1* 36.3* 28.8* 31.6*  MCV 89.3 86.6 87.9 88.1  --   PLT 272 283 303 233  --    Basic Metabolic Panel: Recent Labs  Lab 05/22/17 0727 05/26/17 2150 05/27/17 1545 05/27/17 1949 05/28/17 0351  NA 136 139 134*  --  136  K 4.3 3.6 3.5  --  3.9  CL 105 100* 99*  --  108  CO2 24 18* 16*  --  17*  GLUCOSE  103* 69 72  --  77  BUN 6 10 15   --  7  CREATININE 0.61 1.17 0.94  --  0.66  CALCIUM 9.4 9.2 9.2  --  8.1*  MG  --   --   --  1.8 1.8  PHOS  --   --   --  3.5 2.5   GFR: Estimated Creatinine Clearance: 102.5 mL/min (by C-G formula based on SCr of 0.66 mg/dL). Liver Function Tests: Recent Labs  Lab 05/22/17 0727 05/26/17 2150 05/27/17 1545 05/28/17 0351  AST 50* 53* 67* 43*  ALT 40 39 40 28  ALKPHOS 108 111 121 85  BILITOT 0.6 0.7 1.0 1.0  PROT 8.6* 7.9 8.3* 6.1*  ALBUMIN 4.4 4.1 4.3 3.1*   Recent Labs    Lab 05/22/17 0727 05/26/17 2150 05/27/17 1545 05/28/17 0351  LIPASE 22 43 91* 63*   No results for input(s): AMMONIA in the last 168 hours. Coagulation Profile: No results for input(s): INR, PROTIME in the last 168 hours. Cardiac Enzymes: No results for input(s): CKTOTAL, CKMB, CKMBINDEX, TROPONINI in the last 168 hours. BNP (last 3 results) No results for input(s): PROBNP in the last 8760 hours. HbA1C: No results for input(s): HGBA1C in the last 72 hours. CBG: Recent Labs  Lab 05/28/17 0647  GLUCAP 69   Lipid Profile: No results for input(s): CHOL, HDL, LDLCALC, TRIG, CHOLHDL, LDLDIRECT in the last 72 hours. Thyroid Function Tests: No results for input(s): TSH, T4TOTAL, FREET4, T3FREE, THYROIDAB in the last 72 hours. Anemia Panel: No results for input(s): VITAMINB12, FOLATE, FERRITIN, TIBC, IRON, RETICCTPCT in the last 72 hours. Sepsis Labs: No results for input(s): PROCALCITON, LATICACIDVEN in the last 168 hours.  No results found for this or any previous visit (from the past 240 hour(s)).       Radiology Studies: No results found.      Scheduled Meds: . dicyclomine  20 mg Oral BID  . gabapentin  300 mg Oral TID  . hydrALAZINE  25 mg Oral QHS  . lipase/protease/amylase  24,000 Units Oral TID WC  . loratadine  10 mg Oral Daily  . LORazepam  0-4 mg Intravenous Q6H   Or  . LORazepam  0-4 mg Oral Q6H  . [START ON 05/30/2017] LORazepam  0-4 mg Intravenous Q12H   Or  . [START ON 05/30/2017] LORazepam  0-4 mg Oral Q12H  . metoprolol tartrate  25 mg Oral BID  . naphazoline-pheniramine  1 drop Both Eyes Daily  . [START ON 05/31/2017] pantoprazole  40 mg Intravenous Q12H  . sertraline  75 mg Oral Daily   Continuous Infusions: . 0.9 % NaCl with KCl 20 mEq / L 100 mL/hr at 05/27/17 2055  . pantoprozole (PROTONIX) infusion 8 mg/hr (05/27/17 2304)  . banana bag IV 1000 mL Stopped (05/28/17 0603)     LOS: 1 day       Georgette Shell, MD [facility]  If 7PM-7AM, please contact night-coverage [email or web address] Password [email or web address] 05/28/2017, 1:01 PM

## 2017-05-28 NOTE — ED Notes (Signed)
Called to room by patient. Pt states he needs his pain medication he is allowed to have every 3 hours.

## 2017-05-28 NOTE — Progress Notes (Signed)
Pt received IV ativan for CIWA score at 1245. Pt became lethargic and began acting confused. VSS. MD notified

## 2017-05-29 LAB — HEMOGLOBIN AND HEMATOCRIT, BLOOD
HCT: 27.5 % — ABNORMAL LOW (ref 39.0–52.0)
HCT: 28 % — ABNORMAL LOW (ref 39.0–52.0)
HCT: 29.2 % — ABNORMAL LOW (ref 39.0–52.0)
Hemoglobin: 8.9 g/dL — ABNORMAL LOW (ref 13.0–17.0)
Hemoglobin: 9 g/dL — ABNORMAL LOW (ref 13.0–17.0)
Hemoglobin: 9.2 g/dL — ABNORMAL LOW (ref 13.0–17.0)

## 2017-05-29 MED ORDER — LORAZEPAM 1 MG PO TABS
1.0000 mg | ORAL_TABLET | Freq: Once | ORAL | Status: AC
  Administered 2017-05-29: 1 mg via ORAL
  Filled 2017-05-29: qty 1

## 2017-05-29 NOTE — Progress Notes (Signed)
Dr returned page. Will put in one time dose for 1mg  ativan po.

## 2017-05-29 NOTE — Progress Notes (Signed)
Pt. Continues to roam the hallways, asking for pain medicine. Says he is "having the worse pain of his life." and stating that "we" just want to keep the pain medicine because of all the children that get addicted but this pain medicine doesn't even touch his pain. Unable to assess VS due to patient walking the hallways and won't come to his room.

## 2017-05-29 NOTE — Progress Notes (Signed)
Pt not compliant with staying in bed despite bed alarms. Walking the halls. States we can't make him stay in bed and he isn't going back to bed. Cussing and saying he will go to [place] next time. Will continue to monitor.

## 2017-05-29 NOTE — Progress Notes (Signed)
Pt. Continues to be non-compliant, ambulating without assistance, wandering the hallway. Notified by Network engineer that pt. Was down opposite hall for a while. This writer went to look for patient and he was not in the hallway. When this writer started walking towards the patient's room the patient suddenly appeared near the front of the opposite hallway. Pt. Was not there before so suspect pt. Was in another pt. Room.   Pt. Came to desk with wad of cash, stated earlier to Caryl Pina, Therapist, sports and this Probation officer that cash was missing. Concern about where this cash came from. Called AC to express concerns of the pt. Entering another pt.'s room. Asked to notify security. Security notified.   Later pt. Again seen walking down opposite hallway. Pt. Entered room at end of hallway (38.) Pt. Asked by staff why he went in the room and he stated "to look at the tree."  Pt. told not to go in any other room but his own (13).

## 2017-05-29 NOTE — Progress Notes (Signed)
Patient now requesting ativan. Paged MD. Waiting for call back.

## 2017-05-29 NOTE — Progress Notes (Signed)
Throughout the night the patient has been repeatedly asked for medication, argues that he hasn't had what he has had. IV's out X 2 d/t the fact patient will not use call bell and states "I'm not a fall risk" I reiterated that he was d/t to the IV fluids, the narcotics, the SCDS. He was argumentative and  Did get frustrated with continually explaining what was going on with him. He was always sleeping when time to do CIWAs, however, he would decide he needed ativan and give symptoms that did not appear to be the same as I observed. No sweating, Saying the "television is talking to me" when the TV was on. I feel he is frustrated with the lack of being able to get up and go when he wants to, eat what he wants, etc. He threw candy wrappers in the toilet and flushed them. I explained that was not a good thing to do. I did tell the patient I was sorry if I seemed frustrated with him, but it was his safety I was concerned about. Report given to [facility].

## 2017-05-29 NOTE — Progress Notes (Signed)
PROGRESS NOTE    [name]  QIO:[id number] DOB: 1970-10-05 DOA: 05/27/2017 PCP: Marliss Coots, NP  Brief Narrative: 47 year old African American male that is well known to the ER staff. Patient caries diagnosis of chronic pancreatitis with pancreatic cyst, alcohol abuse, HTN, WPW, cocaine abuse, GERD and Bipolar. Patient tells me that he relapsed into abusing alcohol again about a week ago because his [place] was having seizures. Patient's last alcohol drink was yesterday. According to the patient, he drinks beer and liquor. Patient developed abdominal pain, nausea, vomiting and diarrhea last night according to the patient. Abdominal pain is vague, but deemed to mainly left upper abdominal area. Patient also reported vomiting bright red blood on 2 occasions, the last being this morning. Diarrheal Stool is non bloody. Lipase is noted to be elevated. Patient is volume deplete, with potassium of 3.5, CO2 of 16 with anion gap of 19. There is documented history of Gowdy powder use.  1/21-when I walked into the room patient was sleeping I waited for him to wake up for a few minutes I called his name he kept sleeping finally I touched him and shook him up to wake him up.  He says that he is on clear liquids and has not been on regular diet yet.  He does not appear to be in any distress.  Assessment & Plan:   Active Problems:   Abdominal pain   Hematemesis 1] chronic pancreatitis his lipase have been normalized.  I will increase his diet to full liquid diet and then regular diet.  I have told him that I will be discharging him home tomorrow.  2] alcohol abuse patient request Ativan and Dilaudid repeatedly.  He is not hypotensive tachycardic or tachypneic.  No tremors noted.  3] chronic narcotic dependence  4] history  of cocaine abuse    DVT prophylaxis: scd Code Status: Full Family Communication: None Disposition Plan: Discharge home tomorrow Consultants: None  Procedures:  None Antimicrobials: None Subjective: No specific complaints   Objective: Vitals:   05/28/17 1742 05/28/17 2215 05/29/17 0529 05/29/17 1000  BP: (!) 143/94 128/79 (!) 150/83 (!) 130/99  Pulse: 92 82 82 80  Resp: 16 16 16 18   Temp: 98.5 F (36.9 C)  97.7 F (36.5 C) (!) 97.5 F (36.4 C)  TempSrc: Oral  Oral Oral  SpO2: 100% 96% 100% 98%    Intake/Output Summary (Last 24 hours) at 05/29/2017 1314 Last data filed at 05/29/2017 0900 Gross per 24 hour  Intake 340 ml  Output -  Net 340 ml   There were no vitals filed for this visit.  Examination:  General exam: Appears calm and comfortable  Respiratory system: Clear to auscultation. Respiratory effort normal. Cardiovascular system: S1 & S2 heard, RRR. No JVD, murmurs, rubs, gallops or clicks. No pedal edema. Gastrointestinal system: Abdomen is nondistended, soft and nontender. No organomegaly or masses felt. Normal bowel sounds heard. Central nervous system: Alert and oriented. No focal neurological deficits. Extremities: Symmetric 5 x 5 power. Skin: No rashes, lesions or ulcers    Data Reviewed: I have personally reviewed following labs and imaging studies  CBC: Recent Labs  Lab 05/26/17 2150 05/27/17 1545 05/28/17 0351 05/28/17 1148 05/28/17 2107 05/29/17 0459 05/29/17 1035  WBC 15.1* 13.1* 10.3  --   --   --   --   HGB 11.2* 11.8* 9.5* 10.0* 9.8* 8.9* 9.0*  HCT 33.1* 36.3* 28.8* 31.6* 30.8* 27.5* 28.0*  MCV 86.6 87.9 88.1  --   --   --   --  PLT 283 303 233  --   --   --   --    Basic Metabolic Panel: Recent Labs  Lab 05/26/17 2150 05/27/17 1545 05/27/17 1949 05/28/17 0351  NA 139 134*  --  136  K 3.6 3.5  --  3.9  CL 100* 99*  --  108  CO2 18* 16*  --  17*  GLUCOSE 69 72  --  77  BUN 10 15  --  7  CREATININE 1.17 0.94  --  0.66  CALCIUM 9.2 9.2  --  8.1*  MG  --   --  1.8 1.8  PHOS  --   --  3.5 2.5   GFR: Estimated Creatinine Clearance: 102.5 mL/min (by C-G formula based on SCr of 0.66  mg/dL). Liver Function Tests: Recent Labs  Lab 05/26/17 2150 05/27/17 1545 05/28/17 0351  AST 53* 67* 43*  ALT 39 40 28  ALKPHOS 111 121 85  BILITOT 0.7 1.0 1.0  PROT 7.9 8.3* 6.1*  ALBUMIN 4.1 4.3 3.1*   Recent Labs  Lab 05/26/17 2150 05/27/17 1545 05/28/17 0351  LIPASE 43 91* 63*   No results for input(s): AMMONIA in the last 168 hours. Coagulation Profile: No results for input(s): INR, PROTIME in the last 168 hours. Cardiac Enzymes: No results for input(s): CKTOTAL, CKMB, CKMBINDEX, TROPONINI in the last 168 hours. BNP (last 3 results) No results for input(s): PROBNP in the last 8760 hours. HbA1C: No results for input(s): HGBA1C in the last 72 hours. CBG: Recent Labs  Lab 05/28/17 0647  GLUCAP 69   Lipid Profile: No results for input(s): CHOL, HDL, LDLCALC, TRIG, CHOLHDL, LDLDIRECT in the last 72 hours. Thyroid Function Tests: No results for input(s): TSH, T4TOTAL, FREET4, T3FREE, THYROIDAB in the last 72 hours. Anemia Panel: No results for input(s): VITAMINB12, FOLATE, FERRITIN, TIBC, IRON, RETICCTPCT in the last 72 hours. Sepsis Labs: No results for input(s): PROCALCITON, LATICACIDVEN in the last 168 hours.  No results found for this or any previous visit (from the past 240 hour(s)).       Radiology Studies: No results found.      Scheduled Meds: . dicyclomine  20 mg Oral BID  . gabapentin  300 mg Oral TID  . hydrALAZINE  25 mg Oral QHS  . lipase/protease/amylase  24,000 Units Oral TID WC  . loratadine  10 mg Oral Daily  . LORazepam  0-4 mg Intravenous Q6H   Or  . LORazepam  0-4 mg Oral Q6H  . [START ON 05/30/2017] LORazepam  0-4 mg Intravenous Q12H   Or  . [START ON 05/30/2017] LORazepam  0-4 mg Oral Q12H  . metoprolol tartrate  25 mg Oral BID  . naphazoline-pheniramine  1 drop Both Eyes Daily  . [START ON 05/31/2017] pantoprazole  40 mg Intravenous Q12H  . pneumococcal 23 valent vaccine  0.5 mL Intramuscular Tomorrow-1000  . sertraline  75  mg Oral Daily   Continuous Infusions: . 0.9 % NaCl with KCl 20 mEq / L 100 mL/hr at 05/29/17 0823  . pantoprozole (PROTONIX) infusion 8 mg/hr (05/29/17 0948)     LOS: 2 days    Georgette Shell, MD [facility]  If 7PM-7AM, please contact night-coverage [email or web address] Password [email or web address] 05/29/2017, 1:14 PM

## 2017-05-29 NOTE — Care Management Note (Signed)
Case Management Note  Patient Details  Name: [name] MRN: [id number] Date of Birth: [birth year]  Subjective/Objective:     Pt admitted with pancreatitis. He is from home. Pt without insurance but follows with Reginal Lutes NP at the [facility].                Action/Plan: Plan is for patient to d/c home when medically stable. CM left voice mail for Reginal Lutes, NP about patients admission. CM will follow up with Reginal Lutes, NP for hospital f/u and medication assistance for the patient at d/c.   Expected Discharge Date:                  Expected Discharge Plan:     In-House Referral:     Discharge planning Services     Post Acute Care Choice:    Choice offered to:     DME Arranged:    DME Agency:     HH Arranged:    HH Agency:     Status of Service:  In process, will continue to follow  If discussed at Long Length of Stay Meetings, dates discussed:    Additional Comments:  Pollie Friar, RN 05/29/2017, 12:14 PM

## 2017-05-29 NOTE — Progress Notes (Signed)
Notified for banana bag. Spoke to [place], when it did not arrive. He informed me that banana bag was to be given once daily, and the NS with K should be restarted. That is what I did. Order was not totally clear.

## 2017-05-29 NOTE — Progress Notes (Addendum)
Pt. Has been asked by both myself and his nurse Caryl Pina to return to his room. Pt. Refusing. Demanding pain meds, this writer asked patient to return to his room as we cannot give pain meds while he is standing in the hallway cussing staff out. Pt. Became upset and stated he was "getting higher than this at 47 years old." Interaction witness by multiple staff members.   Pt. Stating "I'm in right mind, I'm not confused." Patient witnessed by this writer earlier acting confused, when discussing his heart monitor, pt. Asked about "an oil change." Pt. Also was alternating from being "passed out" to wide awake.    Patient returned to room but then re-entered hallway shortly later and began cussing staff out again. Stated staff were "not giving pain meds because he is black." Continuing to cuss at staff in front of nurses station. Pt. Again asked to return to room but pt. Continued cussing at staff. Security called. AC called as pt. Demanding to see "higher ups."

## 2017-05-30 LAB — HEMOGLOBIN AND HEMATOCRIT, BLOOD
HCT: 26.6 % — ABNORMAL LOW (ref 39.0–52.0)
Hemoglobin: 8.6 g/dL — ABNORMAL LOW (ref 13.0–17.0)

## 2017-05-30 MED ORDER — OXYCODONE HCL 5 MG PO TABS: 5.0000 mg | ORAL_TABLET | Freq: Three times a day (TID) | ORAL | 0 refills | Status: DC | PRN

## 2017-05-30 NOTE — Plan of Care (Signed)
Resolving pain occurrences

## 2017-05-30 NOTE — Progress Notes (Signed)
Please see previous [place] notes. Faxed dc summary to Marliss Coots NP, pt's PCP. Pt will need to call and arrange follow up appointment. Jonnie Finner RN CCM Case Mgmt phone [phone]

## 2017-05-30 NOTE — Discharge Summary (Signed)
Physician Discharge Summary  [name] LNL:[id number] DOB: 1971/01/26 DOA: 05/27/2017  PCP: Marliss Coots, NP  Admit date: 05/27/2017 Discharge date: 05/30/2017  Admitted From: Home Disposition: Home Recommendations for Outpatient Follow-up:  1. Follow up with PCP in 1-2 weeks 2. Please obtain BMP/CBC in one week  Home Health: No Equipment/Devices: None Discharge Condition: Stable CODE STATUS: Full code Diet recommendation: Regular diet Brief/Interim Summary:47 year old [place] American male that is well known to the ER staff. Patient caries diagnosis of chronic pancreatitis with pancreatic cyst, alcohol abuse, HTN, WPW, cocaine abuse, GERD and Bipolar. Patient tells me that he relapsed into abusing alcohol again about a week ago because his [place] was having seizures. Patient's last alcohol drink was yesterday. According to the patient, he drinks beer and liquor. Patient developed abdominal pain, nausea, vomiting and diarrhea last night according to the patient. Abdominal pain is vague, but deemed to mainly left upper abdominal area. Patient also reported vomiting bright red blood on 2 occasions, the last being this morning. Diarrheal Stool is non bloody. Lipase is noted to be elevated. Patient is volume deplete, with potassium of 3.5, CO2 of 16 with anion gap of 19. There is documented history of Gowdy powder use.  1/21-when I walked into the room patient was sleeping I waited for him to wake up for a few minutes I called his name he kept sleeping finally I touched him and shook him up to wake him up.  He says that he is on clear liquids and has not been on regular diet yet.  He does not appear to be in any distress.  05/30/2017 patient was ambulating all over the hall with the IV poll in his hand in no acute distress. Discharge Diagnoses:  Active Problems:   Abdominal pain   Hematemesis   1] chronic pancreatitis his lipase have been normalized.  Patient tolerating  regular diet even though he is asking for pain medications he is walking all over and into other peoples rooms without any difficulty.  I will be discharging home today and I have told him to follow-up with a nurse practitioner [name].  2] alcohol abuse patient request Ativan and Dilaudid repeatedly.  He is not hypotensive tachycardic or tachypneic.  No tremors noted.  3] chronic narcotic dependence  4] history  of cocaine abuse     Discharge Instructions   Allergies as of 05/30/2017      Reactions   Robaxin [methocarbamol] Other (See Comments)   "jumpy limbs"   Shellfish-derived Products Nausea And Vomiting   Trazodone And Nefazodone Other (See Comments)   Reaction:  Muscle spasms   Contrast Media [iodinated Diagnostic Agents] Hives   Reglan [metoclopramide] Other (See Comments)   Reaction:  Muscle spasms      Medication List    STOP taking these medications   gi cocktail Susp suspension   loperamide 2 MG capsule Commonly known as:  IMODIUM     TAKE these medications   albuterol 108 (90 Base) MCG/ACT inhaler Commonly known as:  PROVENTIL HFA;VENTOLIN HFA Inhale 2 puffs into the lungs every 6 (six) hours as needed for wheezing or shortness of breath.   BENGAY EX Apply 1 application topically 3 (three) times daily as needed (joint pain).   dicyclomine 20 MG tablet Commonly known as:  BENTYL Take 1 tablet (20 mg total) by mouth 2 (two) times daily.   FAMOTIDINE PO Take 1 tablet by mouth daily.   fluticasone 50 MCG/ACT nasal spray Commonly  known as:  FLONASE Place 1 spray into both nostrils daily as needed for rhinitis.   folic acid 1 MG tablet Commonly known as:  FOLVITE Take 1 tablet (1 mg total) by mouth daily.   gabapentin 300 MG capsule Commonly known as:  NEURONTIN Take 1 capsule (300 mg total) by mouth 3 (three) times daily. For agitation What changed:    when to take this  reasons to take this  additional instructions   hydrALAZINE 25 MG  tablet Commonly known as:  APRESOLINE Take 1 tablet (25 mg total) by mouth every 8 (eight) hours. What changed:  when to take this   lipase/protease/amylase 12000 units Cpep capsule Commonly known as:  CREON Take 2 capsules (24,000 Units total) by mouth 3 (three) times daily with meals. For pancreatitis   loratadine 10 MG tablet Commonly known as:  CLARITIN Take 1 tablet (10 mg total) by mouth daily. (May purchase from over the counter): For allergies   metoprolol tartrate 25 MG tablet Commonly known as:  LOPRESSOR Take 1 tablet (25 mg total) by mouth 2 (two) times daily. For high blood pressure   multivitamin with minerals Tabs tablet Take 1 tablet by mouth daily.   ondansetron 4 MG tablet Commonly known as:  ZOFRAN Take 1 tablet (4 mg total) every 6 (six) hours by mouth. What changed:    when to take this  reasons to take this   oxyCODONE 5 MG immediate release tablet Commonly known as:  ROXICODONE Take 1 tablet (5 mg total) by mouth every 8 (eight) hours as needed. What changed:    medication strength  how much to take  when to take this   pantoprazole 40 MG tablet Commonly known as:  PROTONIX Take 1 tablet (40 mg total) by mouth daily.   sertraline 25 MG tablet Commonly known as:  ZOLOFT Take 3 tablets (75 mg total) by mouth daily. For depression   SYSTANE OP Place 1 drop into both eyes daily.   thiamine 100 MG tablet Take 1 tablet (100 mg total) by mouth daily.      Follow-up Information    [name], [name], NP Follow up.   Why:  please call to arrange follow up appointment Contact information: [address] [place] [address] [phone]          Allergies  Allergen Reactions  . Robaxin [Methocarbamol] Other (See Comments)    "jumpy limbs"  . Shellfish-Derived Products Nausea And Vomiting  . Trazodone And Nefazodone Other (See Comments)    Reaction:  Muscle spasms  . Contrast Media [Iodinated Diagnostic Agents] Hives  . Reglan  [Metoclopramide] Other (See Comments)    Reaction:  Muscle spasms    Consultations:     Procedures/Studies:  No results found. (Echo, Carotid, EGD, Colonoscopy, ERCP)    Subjective:   Discharge Exam: Vitals:   05/30/17 0105 05/30/17 1018  BP: 137/86 (!) 149/95  Pulse: 79 83  Resp: 18 20  Temp: 98.4 F (36.9 C) 98 F (36.7 C)  SpO2: 100% 100%   Vitals:   05/29/17 1315 05/29/17 2145 05/30/17 0105 05/30/17 1018  BP: (!) 144/95 (!) 158/92 137/86 (!) 149/95  Pulse: 86 89 79 83  Resp: 18 18 18 20   Temp: 98.7 F (37.1 C) 98.5 F (36.9 C) 98.4 F (36.9 C) 98 F (36.7 C)  TempSrc: Oral Oral Oral Oral  SpO2: 100% 100% 100% 100%    General: Pt is alert, awake, not in acute distress Cardiovascular: RRR,  S1/S2 +, no rubs, no gallops Respiratory: CTA bilaterally, no wheezing, no rhonchi Abdominal: Soft, NT, ND, bowel sounds + Extremities: no edema, no cyanosis    The results of significant diagnostics from this hospitalization (including imaging, microbiology, ancillary and laboratory) are listed below for reference.     Microbiology: No results found for this or any previous visit (from the past 240 hour(s)).   Labs: BNP (last 3 results) Recent Labs    07/03/16 1314  BNP 97.9   Basic Metabolic Panel: Recent Labs  Lab 05/26/17 2150 05/27/17 1545 05/27/17 1949 05/28/17 0351  NA 139 134*  --  136  K 3.6 3.5  --  3.9  CL 100* 99*  --  108  CO2 18* 16*  --  17*  GLUCOSE 69 72  --  77  BUN 10 15  --  7  CREATININE 1.17 0.94  --  0.66  CALCIUM 9.2 9.2  --  8.1*  MG  --   --  1.8 1.8  PHOS  --   --  3.5 2.5   Liver Function Tests: Recent Labs  Lab 05/26/17 2150 05/27/17 1545 05/28/17 0351  AST 53* 67* 43*  ALT 39 40 28  ALKPHOS 111 121 85  BILITOT 0.7 1.0 1.0  PROT 7.9 8.3* 6.1*  ALBUMIN 4.1 4.3 3.1*   Recent Labs  Lab 05/26/17 2150 05/27/17 1545 05/28/17 0351  LIPASE 43 91* 63*   No results for input(s): AMMONIA in the last 168  hours. CBC: Recent Labs  Lab 05/26/17 2150 05/27/17 1545 05/28/17 0351  05/28/17 2107 05/29/17 0459 05/29/17 1035 05/29/17 1951 05/30/17 0405  WBC 15.1* 13.1* 10.3  --   --   --   --   --   --   HGB 11.2* 11.8* 9.5*   < > 9.8* 8.9* 9.0* 9.2* 8.6*  HCT 33.1* 36.3* 28.8*   < > 30.8* 27.5* 28.0* 29.2* 26.6*  MCV 86.6 87.9 88.1  --   --   --   --   --   --   PLT 283 303 233  --   --   --   --   --   --    < > = values in this interval not displayed.   Cardiac Enzymes: No results for input(s): CKTOTAL, CKMB, CKMBINDEX, TROPONINI in the last 168 hours. BNP: Invalid input(s): POCBNP CBG: Recent Labs  Lab 05/28/17 0647  GLUCAP 69   D-Dimer No results for input(s): DDIMER in the last 72 hours. Hgb A1c No results for input(s): HGBA1C in the last 72 hours. Lipid Profile No results for input(s): CHOL, HDL, LDLCALC, TRIG, CHOLHDL, LDLDIRECT in the last 72 hours. Thyroid function studies No results for input(s): TSH, T4TOTAL, T3FREE, THYROIDAB in the last 72 hours.  Invalid input(s): FREET3 Anemia work up No results for input(s): VITAMINB12, FOLATE, FERRITIN, TIBC, IRON, RETICCTPCT in the last 72 hours. Urinalysis    Component Value Date/Time   COLORURINE YELLOW 05/27/2017 1520   APPEARANCEUR CLEAR 05/27/2017 1520   LABSPEC 1.023 05/27/2017 1520   PHURINE 5.0 05/27/2017 1520   GLUCOSEU NEGATIVE 05/27/2017 1520   HGBUR NEGATIVE 05/27/2017 1520   HGBUR negative 04/30/2010 1020   BILIRUBINUR NEGATIVE 05/27/2017 1520   KETONESUR 80 (A) 05/27/2017 1520   PROTEINUR NEGATIVE 05/27/2017 1520   UROBILINOGEN 0.2 03/15/2015 0508   NITRITE NEGATIVE 05/27/2017 1520   LEUKOCYTESUR NEGATIVE 05/27/2017 1520   Sepsis Labs Invalid input(s): PROCALCITONIN,  WBC,  LACTICIDVEN Microbiology No results  found for this or any previous visit (from the past 240 hour(s)).   Time coordinating discharge: Over 30 minutes  SIGNED:   Georgette Shell, MD  [facility] 05/30/2017,  11:48 AM Pager   If 7PM-7AM, please contact night-coverage [email or web address] Password [email or web address]

## 2017-06-06 ENCOUNTER — Emergency Department (HOSPITAL_COMMUNITY)
Admission: EM | Admit: 2017-06-06 | Discharge: 2017-06-07 | Disposition: A | Discharge status: Home / Self Care | Payer: Self-pay | Attending: Emergency Medicine | Admitting: Emergency Medicine

## 2017-06-06 ENCOUNTER — Encounter (HOSPITAL_COMMUNITY): Payer: Self-pay | Admitting: Emergency Medicine

## 2017-06-06 ENCOUNTER — Other Ambulatory Visit: Payer: Self-pay

## 2017-06-06 DIAGNOSIS — K860 Alcohol-induced chronic pancreatitis: Secondary | ICD-10-CM | POA: Insufficient documentation

## 2017-06-06 DIAGNOSIS — Z79899 Other long term (current) drug therapy: Secondary | ICD-10-CM | POA: Insufficient documentation

## 2017-06-06 DIAGNOSIS — J45909 Unspecified asthma, uncomplicated: Secondary | ICD-10-CM | POA: Insufficient documentation

## 2017-06-06 DIAGNOSIS — K852 Alcohol induced acute pancreatitis without necrosis or infection: Secondary | ICD-10-CM

## 2017-06-06 DIAGNOSIS — F17210 Nicotine dependence, cigarettes, uncomplicated: Secondary | ICD-10-CM | POA: Insufficient documentation

## 2017-06-06 LAB — COMPREHENSIVE METABOLIC PANEL
ALK PHOS: 98 U/L (ref 38–126)
ALT: 44 U/L (ref 17–63)
ANION GAP: 14 (ref 5–15)
AST: 61 U/L — ABNORMAL HIGH (ref 15–41)
Albumin: 3.7 g/dL (ref 3.5–5.0)
BILIRUBIN TOTAL: 0.5 mg/dL (ref 0.3–1.2)
BUN: <5 mg/dL — ABNORMAL LOW (ref 6–20)
CALCIUM: 9.2 mg/dL (ref 8.9–10.3)
CO2: 18 mmol/L — ABNORMAL LOW (ref 22–32)
Chloride: 106 mmol/L (ref 101–111)
Creatinine, Ser: 0.52 mg/dL — ABNORMAL LOW (ref 0.61–1.24)
GFR calc non Af Amer: >60 mL/min (ref >60)
GLUCOSE: 90 mg/dL (ref 65–99)
Potassium: 4.2 mmol/L (ref 3.5–5.1)
Sodium: 138 mmol/L (ref 135–145)
TOTAL PROTEIN: 7.2 g/dL (ref 6.5–8.1)

## 2017-06-06 LAB — LIPASE, BLOOD: Lipase: 107 U/L — ABNORMAL HIGH (ref 11–51)

## 2017-06-06 LAB — CBC
HCT: 34.2 % — ABNORMAL LOW (ref 39.0–52.0)
HEMOGLOBIN: 11.2 g/dL — AB (ref 13.0–17.0)
MCH: 28.8 pg (ref 26.0–34.0)
MCHC: 32.7 g/dL (ref 30.0–36.0)
MCV: 87.9 fL (ref 78.0–100.0)
PLATELETS: 323 10*3/uL (ref 150–400)
RBC: 3.89 MIL/uL — AB (ref 4.22–5.81)
RDW: 18.3 % — ABNORMAL HIGH (ref 11.5–15.5)
WBC: 7.7 10*3/uL (ref 4.0–10.5)

## 2017-06-06 MED ORDER — GI COCKTAIL ~~LOC~~
30.0000 mL | Freq: Once | ORAL | Status: AC
  Administered 2017-06-06: 30 mL via ORAL
  Filled 2017-06-06: qty 30

## 2017-06-06 MED ORDER — PANCRELIPASE (LIP-PROT-AMYL) 12000-38000 UNITS PO CPEP: 24000.0000 [IU] | ORAL_CAPSULE | Freq: Three times a day (TID) | ORAL | 0 refills | Status: DC

## 2017-06-06 MED ORDER — DIPHENHYDRAMINE HCL 50 MG/ML IJ SOLN
25.0000 mg | Freq: Once | INTRAMUSCULAR | Status: AC
  Administered 2017-06-06: 25 mg via INTRAVENOUS
  Filled 2017-06-06: qty 1

## 2017-06-06 MED ORDER — HALOPERIDOL LACTATE 5 MG/ML IJ SOLN
2.5000 mg | Freq: Once | INTRAMUSCULAR | Status: AC
  Administered 2017-06-06: 2.5 mg via INTRAVENOUS
  Filled 2017-06-06: qty 1

## 2017-06-06 MED ORDER — ONDANSETRON HCL 4 MG/2ML IJ SOLN
4.0000 mg | Freq: Once | INTRAMUSCULAR | Status: AC
  Administered 2017-06-06: 4 mg via INTRAVENOUS
  Filled 2017-06-06: qty 2

## 2017-06-06 MED ORDER — KETOROLAC TROMETHAMINE 15 MG/ML IJ SOLN
15.0000 mg | Freq: Once | INTRAMUSCULAR | Status: AC
  Administered 2017-06-06: 15 mg via INTRAVENOUS
  Filled 2017-06-06: qty 1

## 2017-06-06 MED ORDER — OXYCODONE-ACETAMINOPHEN 5-325 MG PO TABS
2.0000 | ORAL_TABLET | Freq: Once | ORAL | Status: AC
  Administered 2017-06-06: 2 via ORAL
  Filled 2017-06-06: qty 2

## 2017-06-06 MED ORDER — SODIUM CHLORIDE 0.9 % IV BOLUS (SEPSIS)
1000.0000 mL | Freq: Once | INTRAVENOUS | Status: AC
  Administered 2017-06-06: 1000 mL via INTRAVENOUS

## 2017-06-06 MED ORDER — MORPHINE SULFATE (PF) 4 MG/ML IV SOLN
6.0000 mg | Freq: Once | INTRAVENOUS | Status: AC
  Administered 2017-06-06: 6 mg via INTRAVENOUS
  Filled 2017-06-06: qty 2

## 2017-06-06 NOTE — ED Provider Notes (Signed)
[facility] EMERGENCY DEPARTMENT Provider Note   CSN: [id number] Arrival date & time: 06/06/17  [place]     History   Chief Complaint Chief Complaint  Patient presents with  . Abdominal Pain    HPI [name] is a 47 y.o. male.  HPI   47 year old male well-known to this ED with history of recurrent pancreatitis with chronic alcoholism here with epigastric and left upper quadrant pain.  The patient was just admitted several weeks ago for similar symptoms.  He states that yesterday, he had beans and several glasses of wine for lunch.  He then developed recurrent epigastric and left upper quadrant pain.  The pain is aching, gnawing, and severe pain.  He had associated nausea and vomiting.  He has had difficulty eating and drinking.  Pain is worse with any attempted eating.  Denies any relieving factors.  He has not had any loose stools.  No fevers.  Pain is similar to his previous episodes of pancreatitis.  He tried his p.o. oxycodone at home without relief.  Past Medical History:  Diagnosis Date  . Alcoholism /alcohol abuse ([place])   . Anemia   . Anxiety   . Arthritis    "knees; arms; elbows" (03/26/2015)  . Asthma   . Bipolar disorder ([place])   . Chronic bronchitis ([place])   . Chronic lower back pain   . Cocaine abuse ([place])   . Depression   . Family history of adverse reaction to anesthesia    "grandmother gets confused"  . Femoral condyle fracture ([place]) 03/08/2014   left medial/notes 03/09/2014  . GERD (gastroesophageal reflux disease)   . H/O hiatal hernia   . H/O suicide attempt 10/2012  . Heart murmur    "when he was little" (03/06/2013)  . High cholesterol   . History of blood transfusion 10/2012   "when I tried to commit suicide"  . History of stomach ulcers   . Hypertension   . Marijuana abuse, continuous   . Migraine    "a few times/year" (03/26/2015)  . Pancreatitis   . Pneumonia 1990's X 3  . PTSD (post-traumatic stress disorder)   .  Shortness of breath    "can happen at anytime" (03/06/2013)  . Sickle cell trait ([place])   . WPW (Wolff-Parkinson-White syndrome)    [name] 03/06/2013    Patient Active Problem List   Diagnosis Date Noted  . Abdominal pain 05/27/2017  . Hematemesis 05/27/2017  . Acute on chronic pancreatitis ([place]) 12/17/2016  . Chronic alcoholic pancreatitis ([place]) 12/05/2016  . Intractable nausea and vomiting 12/05/2016  . Verbally abusive behavior 12/05/2016  . Normocytic anemia 12/05/2016  . Alcohol use disorder, severe, dependence ([place]) 07/25/2016  . Cocaine use disorder, severe, dependence ([place]) 07/25/2016  . Major depressive disorder, recurrent severe without psychotic features ([place]) 07/20/2016  . Leukocytosis   . Hospital acquired PNA 05/20/2015  . Pancreatitis 05/18/2015  . Pseudocyst of pancreas 05/18/2015  . Polysubstance abuse (tobacco, cocaine, THC, and ETOH) 03/26/2015  . Alcohol-induced chronic pancreatitis ([place])   . Benign essential HTN 02/06/2014  . Alcohol-induced acute pancreatitis 11/28/2013  . Pancreatic pseudocyst/cyst 11/25/2013  . MDD (major depressive disorder), recurrent severe, without psychosis ([place]) 10/22/2013  . Severe protein-calorie malnutrition ([place]) 10/10/2013  . Suicide attempt ([place]) 10/08/2013  . 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 TOBACCO ABUSE 03/23/2007    Past Surgical History:  Procedure Laterality Date  . CARDIAC CATHETERIZATION    . EYE SURGERY Left 1990's   "  result of trauma"   . FACIAL FRACTURE SURGERY Left 1990's   "result of trauma"   . FRACTURE SURGERY    . HERNIA REPAIR    . LEFT HEART CATHETERIZATION WITH CORONARY ANGIOGRAM Right 03/07/2013   Procedure: LEFT HEART CATHETERIZATION WITH CORONARY ANGIOGRAM;  Surgeon: [name], MD;  Location: [facility] CATH LAB;  Service: Cardiovascular;  Laterality: Right;  . UMBILICAL HERNIA REPAIR         Home Medications    Prior to Admission medications   Medication  Sig Start Date End Date Taking? Authorizing Provider  albuterol (PROVENTIL HFA;VENTOLIN HFA) 108 (90 Base) MCG/ACT inhaler Inhale 2 puffs into the lungs every 6 (six) hours as needed for wheezing or shortness of breath. 07/25/16  Yes Lindell Spar I, NP  FAMOTIDINE PO Take 1 tablet by mouth at bedtime.    Yes [provider]  gabapentin (NEURONTIN) 300 MG capsule Take 1 capsule (300 mg total) by mouth 3 (three) times daily. For agitation Patient taking differently: Take 300 mg by mouth 3 (three) times daily as needed (pain).  07/25/16  Yes Lindell Spar I, NP  hydrALAZINE (APRESOLINE) 25 MG tablet Take 1 tablet (25 mg total) by mouth every 8 (eight) hours. Patient taking differently: Take 25 mg by mouth at bedtime.  12/29/16  Yes Barton Dubois, MD  loratadine (CLARITIN) 10 MG tablet Take 1 tablet (10 mg total) by mouth daily. (May purchase from over the counter): For allergies 07/26/16  Yes Nwoko, Herbert Pun I, NP  Menthol, Topical Analgesic, (BENGAY EX) Apply 1 application topically 3 (three) times daily as needed (joint pain).   Yes [provider]  metoprolol tartrate (LOPRESSOR) 25 MG tablet Take 1 tablet (25 mg total) by mouth 2 (two) times daily. For high blood pressure 12/29/16  Yes Barton Dubois, MD  ondansetron (ZOFRAN) 4 MG tablet Take 1 tablet (4 mg total) every 6 (six) hours by mouth. Patient taking differently: Take 4 mg by mouth every 6 (six) hours as needed for nausea or vomiting.  03/18/17  Yes Malvin Johns, MD  oxyCODONE (ROXICODONE) 5 MG immediate release tablet Take 1 tablet (5 mg total) by mouth every 8 (eight) hours as needed. 05/30/17 05/30/18 Yes Georgette Shell, MD  pantoprazole (PROTONIX) 40 MG tablet Take 1 tablet (40 mg total) by mouth daily. Patient taking differently: Take 40 mg by mouth 2 (two) times daily.  12/29/16  Yes Barton Dubois, MD  Polyethyl Glycol-Propyl Glycol (SYSTANE OP) Place 1 drop into both eyes daily.   Yes [provider]    sertraline (ZOLOFT) 25 MG tablet Take 3 tablets (75 mg total) by mouth daily. For depression 07/26/16  Yes Lindell Spar I, NP  thiamine 100 MG tablet Take 1 tablet (100 mg total) by mouth daily. 12/30/16  Yes Barton Dubois, MD  dicyclomine (BENTYL) 20 MG tablet Take 1 tablet (20 mg total) by mouth 2 (two) times daily. Patient not taking: Reported on 05/27/2017 03/05/17   Ocie Cornfield T, PA-C  fluticasone [facility]) 50 MCG/ACT nasal spray Place 1 spray into both nostrils daily as needed for rhinitis. Patient not taking: Reported on 06/06/2017 07/25/16   Lindell Spar I, NP  folic acid (FOLVITE) 1 MG tablet Take 1 tablet (1 mg total) by mouth daily. Patient not taking: Reported on 06/06/2017 01/20/17   Georgette Shell, MD  lipase/protease/amylase (CREON) 12000 units CPEP capsule Take 2 capsules (24,000 Units total) by mouth 3 (three) times daily with meals. For pancreatitis 06/06/17  [name], MD  Multiple Vitamin (MULTIVITAMIN WITH MINERALS) TABS tablet Take 1 tablet by mouth daily. Patient not taking: Reported on 06/06/2017 01/20/17   Georgette Shell, MD    Family History Family History  Problem Relation Age of Onset  . Hypertension Mother   . Hypertension Father   . Hypertension Other   . Coronary artery disease Other     Social History Social History   Tobacco Use  . Smoking status: Current Every Day Smoker    Packs/day: 1.00    Types: Cigarettes  . Smokeless tobacco: Never Used  Substance Use Topics  . Alcohol use: Yes  . Drug use: Yes    Types: Marijuana     Allergies   Robaxin [methocarbamol]; Shellfish-derived products; Trazodone and nefazodone; Contrast media [iodinated diagnostic agents]; and Reglan [metoclopramide]   Review of Systems Review of Systems  Gastrointestinal: Positive for abdominal pain, nausea and vomiting.  All other systems reviewed and are negative.    Physical Exam Updated Vital Signs BP 122/75   Pulse 93   Temp 97.6 F (36.4  C) (Oral)   Resp 18   Ht 5\' 9"  (1.753 m)   Wt 63.5 kg (140 lb)   SpO2 100%   BMI 20.67 kg/m   Physical Exam  Constitutional: He is oriented to person, place, and time. He appears well-developed and well-nourished. No distress.  HENT:  Head: Normocephalic and atraumatic.  Eyes: Conjunctivae are normal.  Neck: Neck supple.  Cardiovascular: Regular rhythm and normal heart sounds. Tachycardia present. Exam reveals no friction rub.  No murmur heard. Pulmonary/Chest: Effort normal and breath sounds normal. No respiratory distress. He has no wheezes. He has no rales.  Abdominal: He exhibits no distension. There is tenderness in the epigastric area. There is no rigidity, no rebound and no guarding.  Musculoskeletal: He exhibits no edema.  Neurological: He is alert and oriented to person, place, and time. He exhibits normal muscle tone.  Skin: Skin is warm. Capillary refill takes less than 2 seconds.  Psychiatric: He has a normal mood and affect.  Nursing note and vitals reviewed.    ED Treatments / Results  Labs (all labs ordered are listed, but only abnormal results are displayed) Labs Reviewed  LIPASE, BLOOD - Abnormal; Notable for the following components:      Result Value   Lipase 107 (*)    All other components within normal limits  COMPREHENSIVE METABOLIC PANEL - Abnormal; Notable for the following components:   CO2 18 (*)    BUN <5 (*)    Creatinine, Ser 0.52 (*)    AST 61 (*)    All other components within normal limits  CBC - Abnormal; Notable for the following components:   RBC 3.89 (*)    Hemoglobin 11.2 (*)    HCT 34.2 (*)    RDW 18.3 (*)    All other components within normal limits    EKG  EKG Interpretation None       Radiology No results found.  Procedures Procedures (including critical care time)  Medications Ordered in ED Medications  sodium chloride 0.9 % bolus 1,000 mL (1,000 mLs Intravenous New Bag/Given 06/06/17 2239)  sodium chloride 0.9  % bolus 1,000 mL (1,000 mLs Intravenous New Bag/Given 06/06/17 2154)  morphine 4 MG/ML injection 6 mg (6 mg Intravenous Given 06/06/17 2153)  ondansetron (ZOFRAN) injection 4 mg (4 mg Intravenous Given 06/06/17 2154)  gi cocktail (Maalox,Lidocaine,Donnatal) (30 mLs Oral Given 06/06/17 2153)  ketorolac (TORADOL)  15 MG/ML injection 15 mg (15 mg Intravenous Given 06/06/17 2239)  haloperidol lactate (HALDOL) injection 2.5 mg (2.5 mg Intravenous Given 06/06/17 2327)  diphenhydrAMINE (BENADRYL) injection 25 mg (25 mg Intravenous Given 06/06/17 2326)  oxyCODONE-acetaminophen (PERCOCET/ROXICET) 5-325 MG per tablet 2 tablet (2 tablets Oral Given 06/06/17 2327)     Initial Impression / Assessment and Plan / ED Course  I have reviewed the triage vital signs and the nursing notes.  Pertinent labs & imaging results that were available during my care of the patient were reviewed by me and considered in my medical decision making (see chart for details).     47 yo male well-known to this ED here with acute on chronic pancreatitis, likely secondary to recent alcohol use.  On lab work, the patient does have mild elevation in his lipase, consistent with acute on chronic pancreatitis.  He has no white count, no fever, no evidence to suggest necrosis or superinfection.  His abdomen is mildly tender but otherwise soft without distention.  No evidence of hemorrhagic pancreatitis.  Patient was given multiple doses of antiemetics and fluid in the ED.  He is tolerating p.o. without difficulty.  Given that he has a known, well-documented history of chronic pancreatitis secondary to his ongoing alcohol use, and that he is tolerating p.o. without difficulty here in the ED, do not feel admission indicated at this point.  Patient is in agreement and feels like he can manage his symptoms as an outpatient.  We discussed the importance of avoiding alcohol and a pancreatic diet.  Discharged home.  Final Clinical Impressions(s) / ED  Diagnoses   Final diagnoses:  Alcohol-induced chronic pancreatitis [facility])    ED Discharge Orders        Ordered    lipase/protease/amylase (CREON) 12000 units CPEP capsule  3 times daily with meals     06/06/17 2345       [name], MD 06/06/17 2345

## 2017-06-06 NOTE — ED Notes (Signed)
Called patient for vitals recheck x3, no answer

## 2017-06-06 NOTE — ED Triage Notes (Signed)
Pt arrived [facility] for LUQ pain, nausea, and vomiting, related to hx of pancreatitis.

## 2017-06-06 NOTE — ED Triage Notes (Signed)
EMS BP150/80 P100 RR16 [id number]

## 2017-06-07 NOTE — ED Notes (Signed)
Patient Alert and oriented X4. Stable and ambulatory. Patient verbalized understanding of the discharge instructions.  Patient belongings were taken by the patient.  

## 2017-06-08 IMAGING — MR MR 3D RECON AT [facility]ANNER
7 of 14 series · 23 of 48 positions shown · non-contrast
Comparison: 05/25/2014 CT.  11/20/2013 MRI.

CLINICAL DATA: History of pancreatitis. Hypertension.
Hyperlipidemia. Co can't alcohol abuse.

EXAM:
MRI ABDOMEN WITHOUT CONTRAST  (INCLUDING MRCP)
TECHNIQUE: Multiplanar multisequence MR imaging of the abdomen was performed.
Heavily T2-weighted images of the biliary and pancreatic ducts were
obtained, and three-dimensional MRCP images were rendered by post
processing.

[Series 5: cor ssfse nav · coronal · 6.0mm · 0.62mm/px · 2 of 29 slices shown]
[im 1/29]
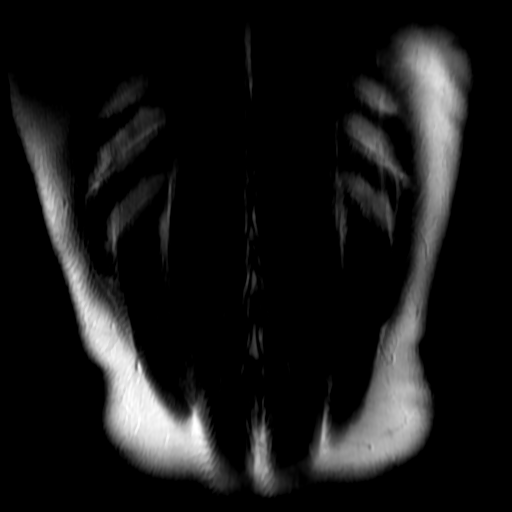
[im 29/29]
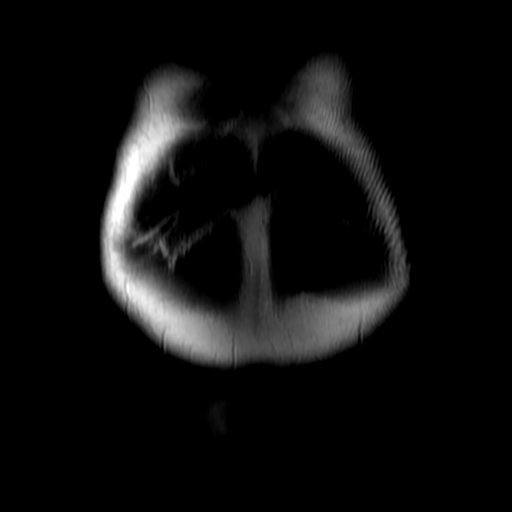

[Series 6: ax ssfse nav · axial · 6.0mm · 0.70mm/px · z∈[+47,+275]mm · 2 of 33 slices shown (1 of 2)]
[im 1/33]
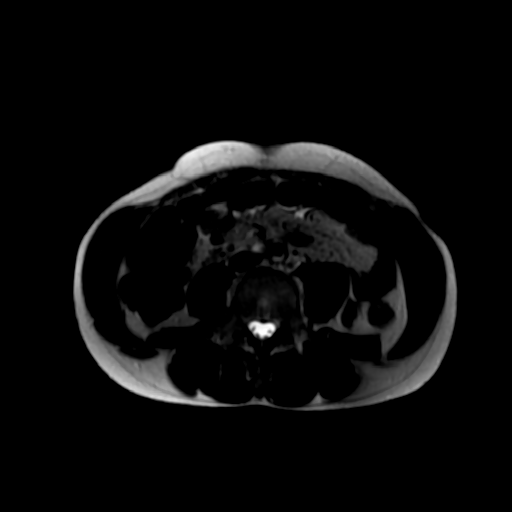
[im 33/33]
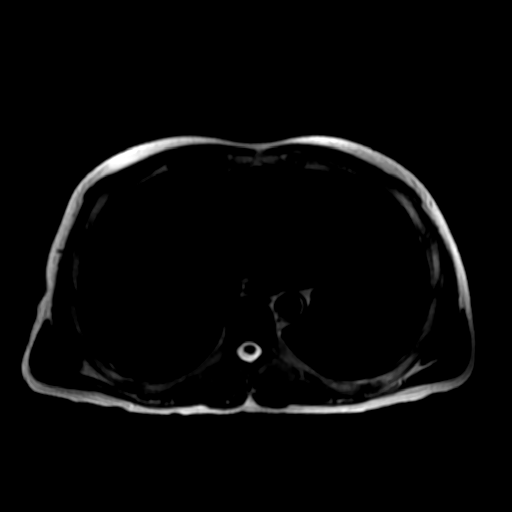

[Series 7: ax ssfse nav · axial · 6.0mm · 0.70mm/px · z∈[+47,+275]mm · 3 of 39 slices shown (2 of 2)]
[im 1/39]
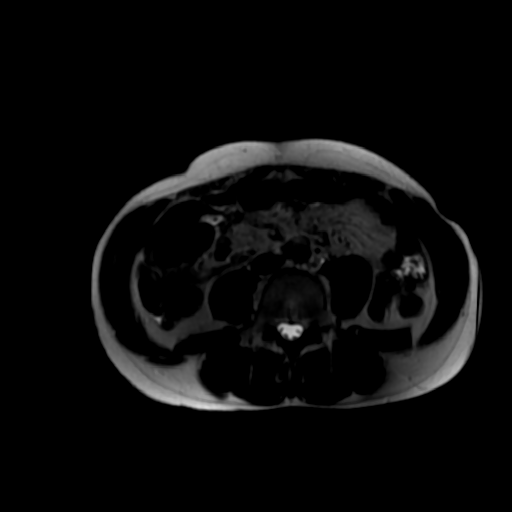
[im 20/39]
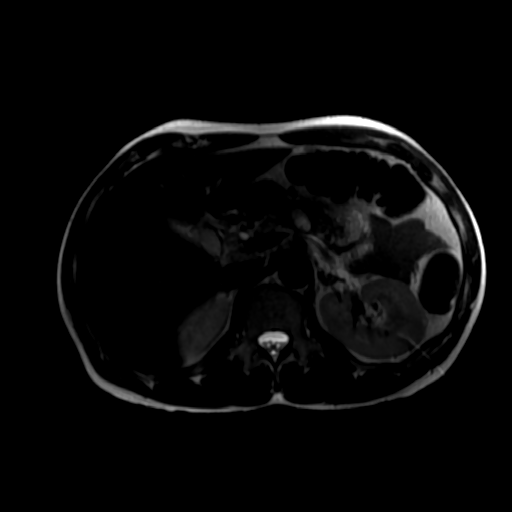
[im 39/39]
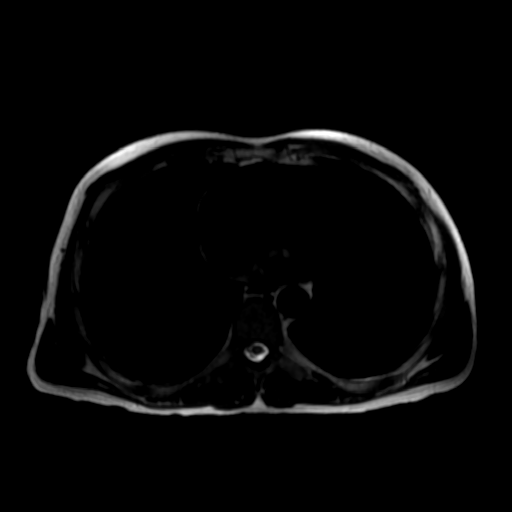

[Series 10: DWI b500 · axial · 8.0mm · 1.48mm/px · z∈[+49,+273]mm · 7 of 87 slices shown]
[im 1/87]
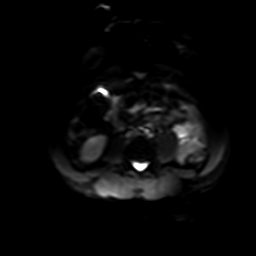
[im 15/87]
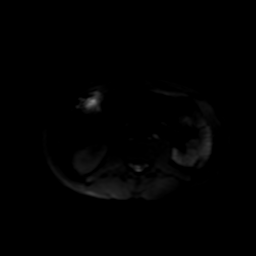
[im 29/87]
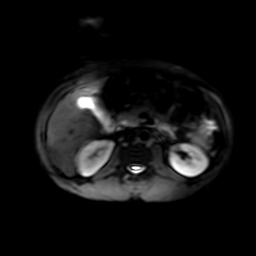
[im 44/87]
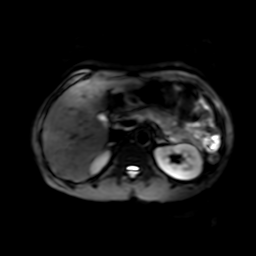
[im 58/87]
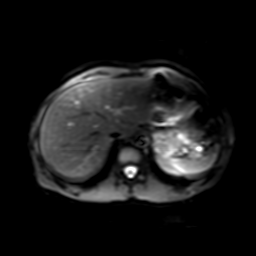
[im 72/87]
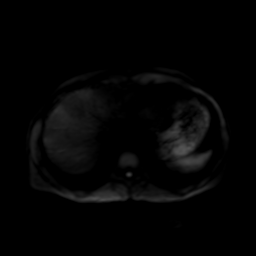
[im 87/87]
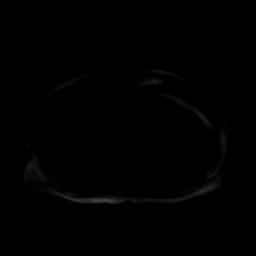

[Series 11: T2 fat-sat · axial · 6.0mm · 0.70mm/px · z∈[+47,+275]mm · 3 of 39 slices shown]
[im 1/39]
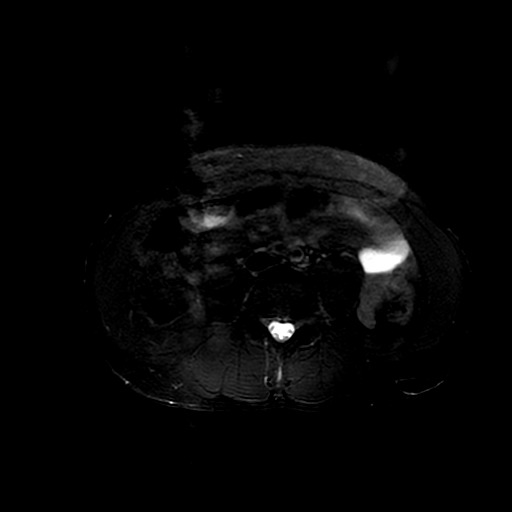
[im 20/39]
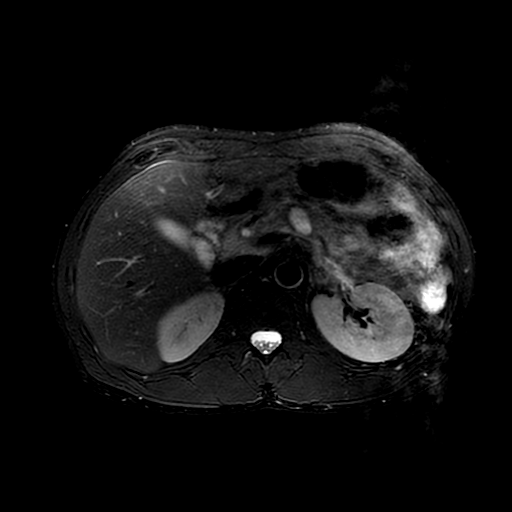
[im 39/39]
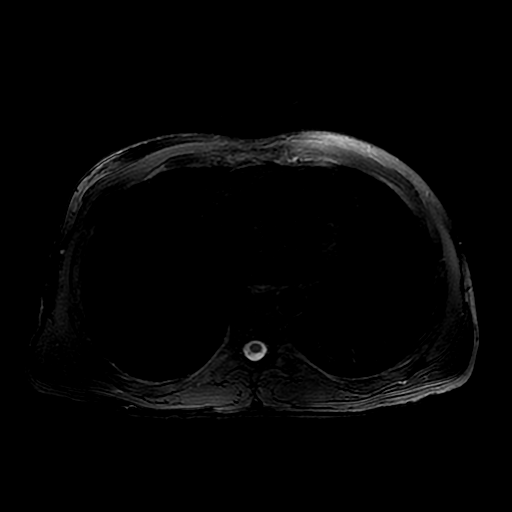

[Series 14: radial 2d thick · oblique · 50.0mm · 0.74mm/px · 1 of 6 slices shown]
[im 1/6]
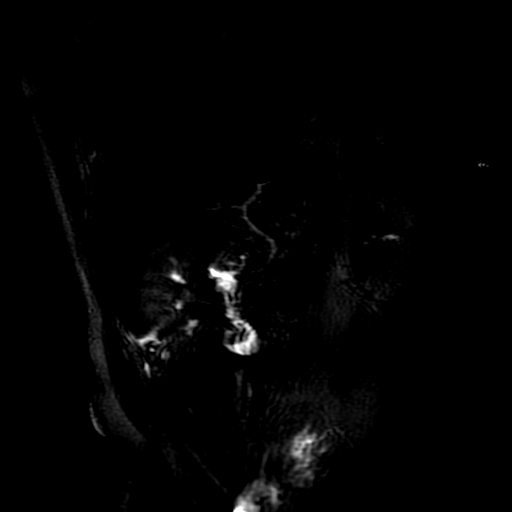

[Series 18: T1 dynamic · coronal · 3.5mm · 0.70mm/px · 5 of 76 slices shown]
[im 1/76]
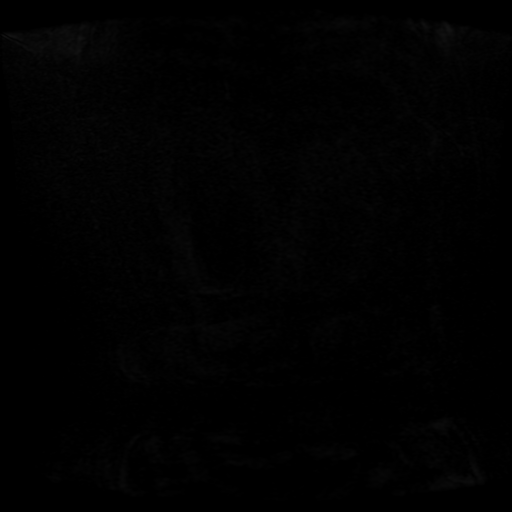
[im 16/76]
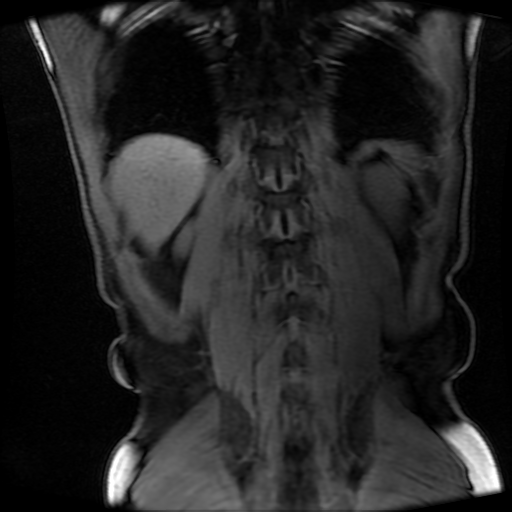
[im 31/76]
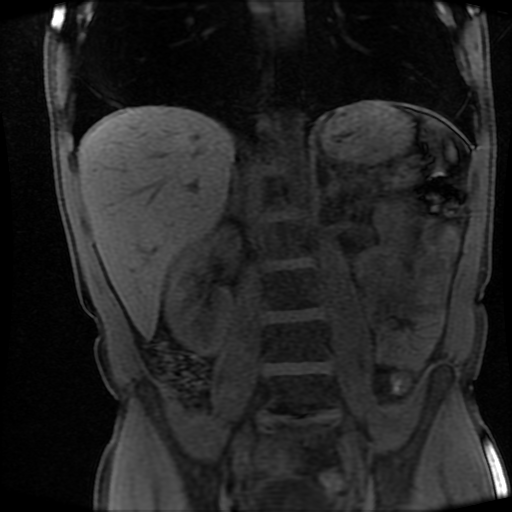
[im 46/76]
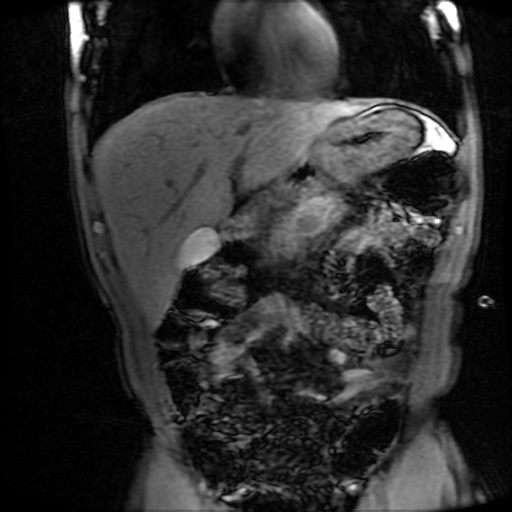
[im 61/76]
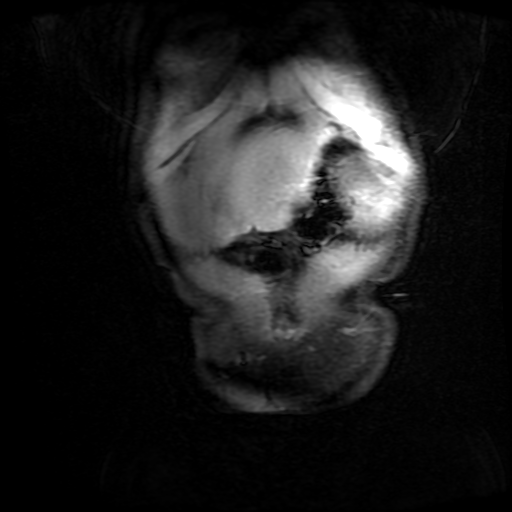

[23 of 48 positions shown; findings below may reference images not displayed]

FINDINGS: Mild to moderate motion degradation. The dedicated MRCP sequences
are particularly particular motion degraded.

Lower chest: Normal heart size without pericardial or pleural
effusion.

Hepatobiliary: Normal liver. Normal gallbladder, without intra or
extrahepatic biliary ductal dilatation. No evidence of
choledocholithiasis.

Pancreas: Inflammation is moderate, surrounding the pancreatic tail
and distal body. Example series 11 including on image 13. No
dominant pancreatic mass. No pancreatic duct dilatation. A
peripancreatic fluid collection measures 1.3 cm in the gastrosplenic
ligament on image 11 of series 11. A cystic lesion within the
pancreatic body/ tail junction (likely upstream from the previously
described lesion) measures 9 mm on image 16 of series 11. A
pancreatic body cystic lesion measures 1.7 cm on image 20 of series
11.

Spleen: Normal

Adrenals/Urinary Tract: Normal adrenal glands. Normal kidneys,
without hydronephrosis.

Stomach/Bowel: The stomach appears thick walled, but is
underdistended. Measures up to 2.4 cm along the greater curvature in
the gastric body on image 8 of series 6. Abdominal bowel loops
within normal limits.

Vascular/Lymphatic: Normal caliber of the aorta and branch vessels.
No retroperitoneal or retrocrural adenopathy.

Other: Trace ascites in the right paracolic gutter and along the
greater curvature of the stomach.

Musculoskeletal: No acute osseous abnormality.
IMPRESSION: 1. Mild to moderate motion degradation.
2. Moderate peripancreatic inflammation, centered about the tail and
upstream body.
3. Cystic lesions within the pancreatic body are likely pseudocysts
and are new since the [DATE] prior exam. tiny pancreatic tail
cystic lesion has resolved.
4. Small fluid collection in the gastrosplenic ligament likely
represents an early pseudocyst.
5. Although the stomach is underdistended, wall thickening is
suspected and may represent secondary gastritis.
6. Small volume ascites.
7. No evidence of choledocholithiasis or biliary duct dilatation.
8. No evidence of pancreas divisum.

## 2017-06-09 ENCOUNTER — Other Ambulatory Visit: Payer: Self-pay

## 2017-06-09 ENCOUNTER — Encounter (HOSPITAL_COMMUNITY): Payer: Self-pay | Admitting: Emergency Medicine

## 2017-06-09 ENCOUNTER — Emergency Department (HOSPITAL_COMMUNITY)
Admission: EM | Admit: 2017-06-09 | Discharge: 2017-06-09 | Disposition: A | Discharge status: Home / Self Care | Payer: Self-pay | Attending: Emergency Medicine | Admitting: Emergency Medicine

## 2017-06-09 DIAGNOSIS — F1020 Alcohol dependence, uncomplicated: Secondary | ICD-10-CM | POA: Insufficient documentation

## 2017-06-09 DIAGNOSIS — F1910 Other psychoactive substance abuse, uncomplicated: Secondary | ICD-10-CM | POA: Insufficient documentation

## 2017-06-09 DIAGNOSIS — F17210 Nicotine dependence, cigarettes, uncomplicated: Secondary | ICD-10-CM | POA: Insufficient documentation

## 2017-06-09 DIAGNOSIS — R110 Nausea: Secondary | ICD-10-CM | POA: Insufficient documentation

## 2017-06-09 DIAGNOSIS — I10 Essential (primary) hypertension: Secondary | ICD-10-CM | POA: Insufficient documentation

## 2017-06-09 DIAGNOSIS — I456 Pre-excitation syndrome: Secondary | ICD-10-CM | POA: Insufficient documentation

## 2017-06-09 DIAGNOSIS — R1012 Left upper quadrant pain: Secondary | ICD-10-CM | POA: Insufficient documentation

## 2017-06-09 DIAGNOSIS — R109 Unspecified abdominal pain: Secondary | ICD-10-CM

## 2017-06-09 DIAGNOSIS — R197 Diarrhea, unspecified: Secondary | ICD-10-CM | POA: Insufficient documentation

## 2017-06-09 DIAGNOSIS — D573 Sickle-cell trait: Secondary | ICD-10-CM | POA: Insufficient documentation

## 2017-06-09 LAB — COMPREHENSIVE METABOLIC PANEL
ALBUMIN: 4.4 g/dL (ref 3.5–5.0)
ALT: 49 U/L (ref 17–63)
ANION GAP: 11 (ref 5–15)
AST: 75 U/L — ABNORMAL HIGH (ref 15–41)
Alkaline Phosphatase: 121 U/L (ref 38–126)
BUN: 8 mg/dL (ref 6–20)
CHLORIDE: 101 mmol/L (ref 101–111)
CO2: 23 mmol/L (ref 22–32)
Calcium: 9.4 mg/dL (ref 8.9–10.3)
Creatinine, Ser: 0.66 mg/dL (ref 0.61–1.24)
GFR calc non Af Amer: >60 mL/min (ref >60)
GLUCOSE: 99 mg/dL (ref 65–99)
Potassium: 5.1 mmol/L (ref 3.5–5.1)
SODIUM: 135 mmol/L (ref 135–145)
Total Bilirubin: 1.1 mg/dL (ref 0.3–1.2)
Total Protein: 9 g/dL — ABNORMAL HIGH (ref 6.5–8.1)

## 2017-06-09 LAB — CBC
HEMATOCRIT: 37.6 % — AB (ref 39.0–52.0)
HEMOGLOBIN: 12.7 g/dL — AB (ref 13.0–17.0)
MCH: 28.9 pg (ref 26.0–34.0)
MCHC: 33.8 g/dL (ref 30.0–36.0)
MCV: 85.6 fL (ref 78.0–100.0)
Platelets: 370 10*3/uL (ref 150–400)
RBC: 4.39 MIL/uL (ref 4.22–5.81)
RDW: 17.8 % — ABNORMAL HIGH (ref 11.5–15.5)
WBC: 10.3 10*3/uL (ref 4.0–10.5)

## 2017-06-09 LAB — URINALYSIS, ROUTINE W REFLEX MICROSCOPIC
Bilirubin Urine: NEGATIVE
Glucose, UA: NEGATIVE mg/dL
HGB URINE DIPSTICK: NEGATIVE
Ketones, ur: 5 mg/dL — AB
Leukocytes, UA: NEGATIVE
Nitrite: NEGATIVE
PH: 5 (ref 5.0–8.0)
Protein, ur: NEGATIVE mg/dL
SPECIFIC GRAVITY, URINE: 1.017 (ref 1.005–1.030)

## 2017-06-09 LAB — LIPASE, BLOOD: LIPASE: 35 U/L (ref 11–51)

## 2017-06-09 MED ORDER — SUCRALFATE 1 G PO TABS: 1.0000 g | ORAL_TABLET | Freq: Three times a day (TID) | ORAL | 0 refills | Status: DC

## 2017-06-09 MED ORDER — ONDANSETRON 8 MG PO TBDP
8.0000 mg | ORAL_TABLET | Freq: Once | ORAL | Status: AC
  Administered 2017-06-09: 8 mg via ORAL
  Filled 2017-06-09: qty 1

## 2017-06-09 MED ORDER — KETOROLAC TROMETHAMINE 15 MG/ML IJ SOLN
15.0000 mg | Freq: Once | INTRAMUSCULAR | Status: AC
  Administered 2017-06-09: 15 mg via INTRAVENOUS
  Filled 2017-06-09: qty 1

## 2017-06-09 MED ORDER — PANTOPRAZOLE SODIUM 40 MG PO TBEC: 40.0000 mg | DELAYED_RELEASE_TABLET | Freq: Every day | ORAL | 1 refills | Status: DC

## 2017-06-09 MED ORDER — ALUM & MAG HYDROXIDE-SIMETH 200-200-20 MG/5ML PO SUSP
30.0000 mL | Freq: Once | ORAL | Status: AC
  Administered 2017-06-09: 30 mL via ORAL
  Filled 2017-06-09: qty 30

## 2017-06-09 MED ORDER — ONDANSETRON HCL 4 MG PO TABS: 4.0000 mg | ORAL_TABLET | Freq: Four times a day (QID) | ORAL | 0 refills | Status: DC

## 2017-06-09 NOTE — ED Triage Notes (Signed)
Per EMS pt complaint of LLQ pain with associated nausea and diarrhea onset this am. Hx of pancreatitis.

## 2017-06-10 ENCOUNTER — Encounter (HOSPITAL_COMMUNITY): Payer: Self-pay | Admitting: Emergency Medicine

## 2017-06-10 ENCOUNTER — Emergency Department (HOSPITAL_COMMUNITY)
Admission: EM | Admit: 2017-06-10 | Discharge: 2017-06-11 | Disposition: A | Discharge status: Home / Self Care | Payer: Self-pay | Attending: Emergency Medicine | Admitting: Emergency Medicine

## 2017-06-10 DIAGNOSIS — F319 Bipolar disorder, unspecified: Secondary | ICD-10-CM | POA: Insufficient documentation

## 2017-06-10 DIAGNOSIS — E871 Hypo-osmolality and hyponatremia: Secondary | ICD-10-CM | POA: Insufficient documentation

## 2017-06-10 DIAGNOSIS — F17210 Nicotine dependence, cigarettes, uncomplicated: Secondary | ICD-10-CM | POA: Insufficient documentation

## 2017-06-10 DIAGNOSIS — D573 Sickle-cell trait: Secondary | ICD-10-CM | POA: Insufficient documentation

## 2017-06-10 DIAGNOSIS — J45909 Unspecified asthma, uncomplicated: Secondary | ICD-10-CM | POA: Insufficient documentation

## 2017-06-10 DIAGNOSIS — F1410 Cocaine abuse, uncomplicated: Secondary | ICD-10-CM | POA: Insufficient documentation

## 2017-06-10 DIAGNOSIS — R109 Unspecified abdominal pain: Secondary | ICD-10-CM | POA: Insufficient documentation

## 2017-06-10 DIAGNOSIS — Y998 Other external cause status: Secondary | ICD-10-CM | POA: Insufficient documentation

## 2017-06-10 DIAGNOSIS — T50902A Poisoning by unspecified drugs, medicaments and biological substances, intentional self-harm, initial encounter: Secondary | ICD-10-CM

## 2017-06-10 DIAGNOSIS — X58XXXA Exposure to other specified factors, initial encounter: Secondary | ICD-10-CM | POA: Insufficient documentation

## 2017-06-10 DIAGNOSIS — Z79899 Other long term (current) drug therapy: Secondary | ICD-10-CM | POA: Insufficient documentation

## 2017-06-10 DIAGNOSIS — F1210 Cannabis abuse, uncomplicated: Secondary | ICD-10-CM | POA: Insufficient documentation

## 2017-06-10 DIAGNOSIS — E876 Hypokalemia: Secondary | ICD-10-CM | POA: Insufficient documentation

## 2017-06-10 DIAGNOSIS — Y9389 Activity, other specified: Secondary | ICD-10-CM | POA: Insufficient documentation

## 2017-06-10 DIAGNOSIS — G8929 Other chronic pain: Secondary | ICD-10-CM

## 2017-06-10 DIAGNOSIS — Y92538 Other ambulatory health services establishments as the place of occurrence of the external cause: Secondary | ICD-10-CM | POA: Insufficient documentation

## 2017-06-10 DIAGNOSIS — I456 Pre-excitation syndrome: Secondary | ICD-10-CM | POA: Insufficient documentation

## 2017-06-10 DIAGNOSIS — T1491XA Suicide attempt, initial encounter: Secondary | ICD-10-CM | POA: Insufficient documentation

## 2017-06-10 DIAGNOSIS — I10 Essential (primary) hypertension: Secondary | ICD-10-CM | POA: Insufficient documentation

## 2017-06-10 LAB — COMPREHENSIVE METABOLIC PANEL
ALBUMIN: 4 g/dL (ref 3.5–5.0)
ALT: 37 U/L (ref 17–63)
ANION GAP: 15 (ref 5–15)
AST: 51 U/L — ABNORMAL HIGH (ref 15–41)
Alkaline Phosphatase: 112 U/L (ref 38–126)
BILIRUBIN TOTAL: 0.9 mg/dL (ref 0.3–1.2)
BUN: 7 mg/dL (ref 6–20)
CHLORIDE: 92 mmol/L — AB (ref 101–111)
CO2: 19 mmol/L — AB (ref 22–32)
Calcium: 9 mg/dL (ref 8.9–10.3)
Creatinine, Ser: 0.64 mg/dL (ref 0.61–1.24)
GFR calc Af Amer: >60 mL/min (ref >60)
GFR calc non Af Amer: >60 mL/min (ref >60)
GLUCOSE: 95 mg/dL (ref 65–99)
POTASSIUM: 3.2 mmol/L — AB (ref 3.5–5.1)
SODIUM: 126 mmol/L — AB (ref 135–145)
TOTAL PROTEIN: 7.8 g/dL (ref 6.5–8.1)

## 2017-06-10 LAB — RAPID URINE DRUG SCREEN, HOSP PERFORMED
Amphetamines: NOT DETECTED
BARBITURATES: POSITIVE — AB
Benzodiazepines: NOT DETECTED
COCAINE: NOT DETECTED
Opiates: NOT DETECTED
TETRAHYDROCANNABINOL: NOT DETECTED

## 2017-06-10 LAB — CBC
HEMATOCRIT: 36.1 % — AB (ref 39.0–52.0)
HEMOGLOBIN: 12.1 g/dL — AB (ref 13.0–17.0)
MCH: 28.7 pg (ref 26.0–34.0)
MCHC: 33.5 g/dL (ref 30.0–36.0)
MCV: 85.7 fL (ref 78.0–100.0)
Platelets: 375 10*3/uL (ref 150–400)
RBC: 4.21 MIL/uL — ABNORMAL LOW (ref 4.22–5.81)
RDW: 17.6 % — ABNORMAL HIGH (ref 11.5–15.5)
WBC: 9.1 10*3/uL (ref 4.0–10.5)

## 2017-06-10 LAB — LIPASE, BLOOD: Lipase: 59 U/L — ABNORMAL HIGH (ref 11–51)

## 2017-06-10 MED ORDER — SODIUM CHLORIDE 0.9 % IV BOLUS (SEPSIS)
500.0000 mL | Freq: Once | INTRAVENOUS | Status: AC
  Administered 2017-06-10: 500 mL via INTRAVENOUS

## 2017-06-10 MED ORDER — MAGNESIUM SULFATE 2 GM/50ML IV SOLN
2.0000 g | Freq: Once | INTRAVENOUS | Status: AC
  Administered 2017-06-10: 2 g via INTRAVENOUS
  Filled 2017-06-10: qty 50

## 2017-06-10 MED ORDER — SERTRALINE HCL 50 MG PO TABS
75.0000 mg | ORAL_TABLET | Freq: Every day | ORAL | Status: DC
  Administered 2017-06-10 – 2017-06-11 (×2): 75 mg via ORAL
  Filled 2017-06-10 (×3): qty 1

## 2017-06-10 MED ORDER — KETOROLAC TROMETHAMINE 15 MG/ML IJ SOLN
15.0000 mg | Freq: Once | INTRAMUSCULAR | Status: AC
  Administered 2017-06-10: 15 mg via INTRAVENOUS
  Filled 2017-06-10: qty 1

## 2017-06-10 MED ORDER — KETAMINE HCL-SODIUM CHLORIDE 100-0.9 MG/10ML-% IV SOSY
0.3000 mg/kg | PREFILLED_SYRINGE | Freq: Once | INTRAVENOUS | Status: AC
  Administered 2017-06-10: 19 mg via INTRAVENOUS
  Filled 2017-06-10: qty 10

## 2017-06-10 MED ORDER — METOPROLOL TARTRATE 25 MG PO TABS
25.0000 mg | ORAL_TABLET | Freq: Two times a day (BID) | ORAL | Status: DC
  Administered 2017-06-10 – 2017-06-11 (×2): 25 mg via ORAL
  Filled 2017-06-10 (×2): qty 1

## 2017-06-10 MED ORDER — GI COCKTAIL ~~LOC~~
30.0000 mL | Freq: Once | ORAL | Status: AC
  Administered 2017-06-10: 30 mL via ORAL
  Filled 2017-06-10: qty 30

## 2017-06-10 MED ORDER — PANCRELIPASE (LIP-PROT-AMYL) 12000-38000 UNITS PO CPEP
24000.0000 [IU] | ORAL_CAPSULE | Freq: Three times a day (TID) | ORAL | Status: DC
  Administered 2017-06-11: 24000 [IU] via ORAL
  Filled 2017-06-10 (×3): qty 2

## 2017-06-10 MED ORDER — POTASSIUM CHLORIDE 10 MEQ/100ML IV SOLN: 10.0000 meq | Freq: Once | INTRAVENOUS | Status: DC

## 2017-06-10 MED ORDER — SUCRALFATE 1 G PO TABS
1.0000 g | ORAL_TABLET | Freq: Three times a day (TID) | ORAL | Status: DC
  Administered 2017-06-10 – 2017-06-11 (×2): 1 g via ORAL
  Filled 2017-06-10 (×2): qty 1

## 2017-06-10 MED ORDER — GABAPENTIN 300 MG PO CAPS
300.0000 mg | ORAL_CAPSULE | Freq: Two times a day (BID) | ORAL | Status: DC
  Administered 2017-06-10 – 2017-06-11 (×2): 300 mg via ORAL
  Filled 2017-06-10 (×2): qty 1

## 2017-06-10 MED ORDER — HYDRALAZINE HCL 25 MG PO TABS
25.0000 mg | ORAL_TABLET | Freq: Every day | ORAL | Status: DC
  Administered 2017-06-10: 25 mg via ORAL
  Filled 2017-06-10: qty 1

## 2017-06-10 MED ORDER — PANTOPRAZOLE SODIUM 40 MG PO TBEC
40.0000 mg | DELAYED_RELEASE_TABLET | Freq: Every day | ORAL | Status: DC
  Administered 2017-06-10 – 2017-06-11 (×2): 40 mg via ORAL
  Filled 2017-06-10 (×2): qty 1

## 2017-06-10 MED ORDER — SODIUM CHLORIDE 0.9 % IV BOLUS (SEPSIS)
1000.0000 mL | Freq: Once | INTRAVENOUS | Status: AC
  Administered 2017-06-10: 1000 mL via INTRAVENOUS

## 2017-06-10 MED ORDER — POTASSIUM CHLORIDE CRYS ER 20 MEQ PO TBCR
40.0000 meq | EXTENDED_RELEASE_TABLET | Freq: Once | ORAL | Status: AC
  Administered 2017-06-10: 40 meq via ORAL
  Filled 2017-06-10: qty 2

## 2017-06-10 NOTE — ED Notes (Signed)
Patient has requested pain medications multiple times; Md requested to speak with patient.

## 2017-06-10 NOTE — ED Notes (Signed)
Pt reports taking unknown amt of percocet today, pt aware he is being IVCd because of attempting to overdose, Charge RN aware of need for suicidal sitter

## 2017-06-10 NOTE — ED Notes (Signed)
Attempted to wheel pt to lobby, pt witnessed swallowing unknown amt of advil, pt encouraged to spit out pills, pt refuses, this RN called for security and pt then spit out pills, unknown if the pt swallowed any, security and charge notified, pt walked to lobby, pt brought back to triage room 2 to have belongings taken for safety, pt refuses, walks out to the lobby, security notified
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
## 2017-06-10 NOTE — ED Notes (Addendum)
Poison Control notified, there are 19 tablets that were spit out, per recommendation pt should have electrolytes, acetaminophen and Salicylate levels 4 hrs after ingestion, the Aleve bottle the pt was witnessed taking had 24 tablets in it per the label

## 2017-06-10 NOTE — ED Notes (Addendum)
This RN went in to speak with patient about no need for blood draw at this time (please see care plan), patient expressing frustrations with no blood being drawn.

## 2017-06-10 NOTE — ED Triage Notes (Signed)
Pt here with chronic complaints of RUQ pain with nausea, diarrhea, and emesis.  Hx of pancreatitis
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
## 2017-06-10 NOTE — ED Notes (Signed)
Zavitz, MD notified of pt taking Advil in front of this RN, security in lobby with the pt, IVC paperwork to be filled out if the pt does not agree to be seen

## 2017-06-10 NOTE — ED Notes (Signed)
All of patient's clothes removed; placed in scrubs.

## 2017-06-11 DIAGNOSIS — F332 Major depressive disorder, recurrent severe without psychotic features: Secondary | ICD-10-CM

## 2017-06-11 DIAGNOSIS — F1099 Alcohol use, unspecified with unspecified alcohol-induced disorder: Secondary | ICD-10-CM

## 2017-06-11 DIAGNOSIS — F1290 Cannabis use, unspecified, uncomplicated: Secondary | ICD-10-CM

## 2017-06-11 DIAGNOSIS — F17210 Nicotine dependence, cigarettes, uncomplicated: Secondary | ICD-10-CM

## 2017-06-11 LAB — BASIC METABOLIC PANEL
ANION GAP: 11 (ref 5–15)
ANION GAP: 10 (ref 5–15)
BUN: 5 mg/dL — ABNORMAL LOW (ref 6–20)
BUN: <5 mg/dL — ABNORMAL LOW (ref 6–20)
CALCIUM: 8.7 mg/dL — AB (ref 8.9–10.3)
CALCIUM: 8.2 mg/dL — AB (ref 8.9–10.3)
CO2: 16 mmol/L — AB (ref 22–32)
CO2: 18 mmol/L — AB (ref 22–32)
CREATININE: 0.56 mg/dL — AB (ref 0.61–1.24)
Chloride: 104 mmol/L (ref 101–111)
Chloride: 103 mmol/L (ref 101–111)
Creatinine, Ser: 0.61 mg/dL (ref 0.61–1.24)
GFR calc Af Amer: >60 mL/min (ref >60)
GFR calc Af Amer: >60 mL/min (ref >60)
GFR calc non Af Amer: >60 mL/min (ref >60)
GFR calc non Af Amer: >60 mL/min (ref >60)
GLUCOSE: 93 mg/dL (ref 65–99)
GLUCOSE: 95 mg/dL (ref 65–99)
Potassium: 4.5 mmol/L (ref 3.5–5.1)
Potassium: 4.3 mmol/L (ref 3.5–5.1)
Sodium: 131 mmol/L — ABNORMAL LOW (ref 135–145)
Sodium: 131 mmol/L — ABNORMAL LOW (ref 135–145)

## 2017-06-11 LAB — ACETAMINOPHEN LEVEL: ACETAMINOPHEN (TYLENOL), SERUM: 12 ug/mL (ref 10–30)

## 2017-06-11 LAB — ETHANOL: Alcohol, Ethyl (B): <10 mg/dL (ref <10)

## 2017-06-11 LAB — SALICYLATE LEVEL: Salicylate Lvl: <7 mg/dL (ref 2.8–30.0)

## 2017-06-11 MED ORDER — ALUM & MAG HYDROXIDE-SIMETH 200-200-20 MG/5ML PO SUSP
30.0000 mL | Freq: Four times a day (QID) | ORAL | Status: DC | PRN
  Administered 2017-06-11 (×2): 30 mL via ORAL
  Filled 2017-06-11 (×2): qty 30

## 2017-06-11 MED ORDER — ACETAMINOPHEN 325 MG PO TABS
650.0000 mg | ORAL_TABLET | Freq: Four times a day (QID) | ORAL | Status: DC | PRN
  Administered 2017-06-11 (×3): 650 mg via ORAL
  Filled 2017-06-11 (×3): qty 2

## 2017-06-11 MED ORDER — IBUPROFEN 400 MG PO TABS
600.0000 mg | ORAL_TABLET | Freq: Four times a day (QID) | ORAL | Status: DC | PRN
  Administered 2017-06-11 (×3): 600 mg via ORAL
  Filled 2017-06-11 (×3): qty 1

## 2017-06-11 MED ORDER — SODIUM CHLORIDE 0.9 % IV BOLUS (SEPSIS)
1000.0000 mL | Freq: Once | INTRAVENOUS | Status: AC
  Administered 2017-06-11: 1000 mL via INTRAVENOUS

## 2017-06-11 MED ORDER — NICOTINE 21 MG/24HR TD PT24
21.0000 mg | MEDICATED_PATCH | Freq: Every day | TRANSDERMAL | Status: DC
  Filled 2017-06-11: qty 1

## 2017-06-11 MED ORDER — ONDANSETRON HCL 4 MG PO TABS: 4.0000 mg | ORAL_TABLET | Freq: Three times a day (TID) | ORAL | Status: DC | PRN

## 2017-06-11 NOTE — ED Notes (Signed)
Breakfast tray re-ordered.

## 2017-06-11 NOTE — Consult Note (Signed)
Telepsych Consultation   Reason for Consult: Possible overdose Referring Physician: Elnora Morrison, MD Location of Patient: [facility] ED Location of Provider: [facility]  Patient Identification: Mills Mitton MRN:  [id number] Principal Diagnosis: <principal problem not specified> Diagnosis:   Patient Active Problem List   Diagnosis Date Noted  . Abdominal pain [R10.9] 05/27/2017  . Hematemesis [K92.0] 05/27/2017  . Acute on chronic pancreatitis ([place]) [K85.90, K86.1] 12/17/2016  . Chronic alcoholic pancreatitis ([place]) [K86.0] 12/05/2016  . Intractable nausea and vomiting [R11.2] 12/05/2016  . Verbally abusive behavior [R46.89] 12/05/2016  . Normocytic anemia [D64.9] 12/05/2016  . Alcohol use disorder, severe, dependence ([place]) [F10.20] 07/25/2016  . Cocaine use disorder, severe, dependence ([place]) [F14.20] 07/25/2016  . Major depressive disorder, recurrent severe without psychotic features ([place]) [F33.2] 07/20/2016  . Leukocytosis [D72.829]   . Hospital acquired PNA [J18.9] 05/20/2015  . Pancreatitis [K85.90] 05/18/2015  . Pseudocyst of pancreas [K86.3] 05/18/2015  . Polysubstance abuse (tobacco, cocaine, THC, and ETOH) [F19.10] 03/26/2015  . Alcohol-induced chronic pancreatitis ([place]) [K86.0]   . Benign essential HTN [I10] 02/06/2014  . Alcohol-induced acute pancreatitis [K85.20] 11/28/2013  . Pancreatic pseudocyst/cyst [H84.6, K86.3] 11/25/2013  . MDD (major depressive disorder), recurrent severe, without psychosis ([place]) [F33.2] 10/22/2013  . Severe protein-calorie malnutrition ([place]) [E43] 10/10/2013  . Suicide attempt ([place]) [T14.91XA] 10/08/2013  . 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 TOBACCO ABUSE [F17.200] 03/23/2007    Total Time spent with patient: 30 minutes  Subjective:   Tycho Dayquan Buys is a 47 y.o. male patient admitted with Major Depressive Disorder, Recurrent, Severe Without Psychotic Features; Alcohol Use  Disorder.  HPI: Per the TTS assessment completed on 06/10/17 by Rico Sheehan: Tahmid Erlin Gardella is an 47 y.o. single male who presents unaccompanied to [facility] ED reporting abdominal pain. Pt has a history of alcohol abuse and has presented to the ED frequently for abdominal pain. Pt states he was in the ED for three days approximately two weeks ago due to pancreatitis and given morphine, which he said was effective. He said he presented yesterday for pain and was given Oxycodone but it didn't help. He says when he returned today he was told he was going to need to wait to be seen by the doctor before blood would be drawn or given a narcotic. Pt says he didn't want to "wait for hours in the waiting room" so he put a handful of Advil in his mouth in front of and RN. He initially refused to spit out the pills then eventually did so. Pt says he had no intention of killing himself but was in pain, impatient, and knew staff would assign him a room. Pt says "I had Oxycodone and if I really wanted to hurt myself I would have taken those."  Pt denies depressive symptoms. He reports he drinks 6-12 beers approximately 3-4 times per week. He says he also uses approximately $20 worth of marijuana 2-3 times per week. Pt has a history of using cocaine and other substances but denies recent use. He denies current suicidal ideation. He reports one previous suicide attempt by overdose three years ago. He denies current homicidal ideation or history of violence. He denies any history of psychotic symptoms.  Pt reports he lives with his mother and brother. He is currently unemployed and has filed for disability. He reports financial problems. Pt states he has no current outpatient mental health providers. He says he takes Zoloft which is prescribed by his primary  care physician. Pt reports his last inpatient psychiatric hospitalization was at [facility] in March 2018 for alcohol use and depressive symptoms.  Pt is  dressed in hospital scrubs, alert and oriented x4. Pt speaks in a clear tone, at moderate volume and normal pace. Motor behavior appears normal. Eye contact is good. [name] mood is euthymic and affect is congruent with mood. Thought process is coherent and relevant. There is no indication Pt is currently responding to internal stimuli or experiencing delusional thought content. Pt was pleasant and cooperative throughout assessment. He says he is seeking treatment for his abdominal pain and does not need inpatient psychiatric treatment at this time.  On Exam: Patient was seen via tele-psych, chart reviewed with treatment team. Patient in bed, awake, alert and oriented x4. Patient reiterated the reason for this hospital admission as documented above. Patient stated, "I'm doing a lot better today. I came to the hospital because I was in so much pain. I have acute pancreatitis and was hurting so bad. I never wanted to hurt myself I took those pills so they can see me faster, but that was a big mistake". Patient's denies any suicide or homicide ideations as well as visual or auditory hallucinations. Patient stated that he has reasons to be alive, he loves his live and love living. He stated that he has a grandson who is turning 14 years old in 3 days, on Wednesday and he wants to be alive to be there for him and be a role model for him when he grows up. He stated that on a normal day he deals with his pain with his prescription medications including, Protonix, Carafate, Creon and Oxycodone. Patient stated that he lives with his mother, Naasir Carreira, patient gave permission for [place] to contact his mother for additional collateral at [phone]. Patient stated that among his biggest stressors are financial problems and not being able to know where his youngest daughter is. She is 85 y.o. and patient saw her last when she was 4 months. Patient stated that her mother took her away from him because of his drinking problems.  Patient's stated that he wants to make some live changes including avoiding drugs and alcohol, that he will continue to attend AA meetings. Patient stated that he has a court date tomorrow about a fine, and he also has another court date coming up for drinking and the public. During this encounter, patient does not appear to be responding to internal stimuli. Patient contracts for safety.  Collateral from patient's mother as obtained by [place], CSW: CSW spoke with pt's mother, Walt Geathers [phone], who states that she is comfortable with pt discharging home today. She reports that there are no guns/weapons in the home. "I didn't even know he called EMS for help until they got here to pick him up." She has no other safety concerns but requested that pt be provided with a taxi voucher rather than bus pass. "I don't think he will be able to navigate the bus system to get back here." Per [name] NP, pt is psychiatrically cleared for discharge. CSW notified [name] and Scientific laboratory technician at [facility] of disposition.  [name], MSW, LCSW Clinical Social Worker 06/11/2017 10:35 AM   Past Psychiatric History: As in H&P  Risk to Self: Suicidal Ideation: No(Pt denies but took OD of Advil in front of nursing staff) Suicidal Intent: No Is patient at risk for suicide?: Yes Suicidal Plan?: Yes-Currently Present Specify Current Suicidal Plan: Pt overdosed  on Advil in front of nursing staff Access to Means: Yes Specify Access to Suicidal Means: Pt had Advil What has been your use of drugs/alcohol within the last 12 months?: Pt using marijuana and alcohol How many times?: 1 Other Self Harm Risks: None Triggers for Past Attempts: Other personal contacts Intentional Self Injurious Behavior: None Risk to Others: Homicidal Ideation: No Thoughts of Harm to Others: No Current Homicidal Intent: No Current Homicidal Plan: No Access to Homicidal Means: No Identified Victim: None History of harm to  others?: No Assessment of Violence: None Noted Violent Behavior Description: None Does patient have access to weapons?: No Criminal Charges Pending?: Yes Describe Pending Criminal Charges: Trespassing Does patient have a court date: Yes Court Date: 06/15/17 Prior Inpatient Therapy: Prior Inpatient Therapy: Yes Prior Therapy Dates: 07/2016 Prior Therapy Facilty/Provider(s): [facility] Reason for Treatment: Depression Prior Outpatient Therapy: Prior Outpatient Therapy: Yes Prior Therapy Dates: 2018 Prior Therapy Facilty/Provider(s): [facility] Reason for Treatment: Depression Does patient have an ACCT team?: No Does patient have Intensive In-House Services?  : No Does patient have [facility] services? : No Does patient have P4CC services?: No  Past Medical History:  Past Medical History:  Diagnosis Date  . Alcoholism /alcohol abuse ([place])   . Anemia   . Anxiety   . Arthritis    "knees; arms; elbows" (03/26/2015)  . Asthma   . Bipolar disorder ([place])   . Chronic bronchitis ([place])   . Chronic lower back pain   . Cocaine abuse ([place])   . Depression   . Family history of adverse reaction to anesthesia    "grandmother gets confused"  . Femoral condyle fracture ([place]) 03/08/2014   left medial/notes 03/09/2014  . GERD (gastroesophageal reflux disease)   . H/O hiatal hernia   . H/O suicide attempt 10/2012  . Heart murmur    "when he was little" (03/06/2013)  . High cholesterol   . History of blood transfusion 10/2012   "when I tried to commit suicide"  . History of stomach ulcers   . Hypertension   . Marijuana abuse, continuous   . Migraine    "a few times/year" (03/26/2015)  . Pancreatitis   . Pneumonia 1990's X 3  . PTSD (post-traumatic stress disorder)   . Shortness of breath    "can happen at anytime" (03/06/2013)  . Sickle cell trait ([place])   . WPW (Wolff-Parkinson-White syndrome)    [name] 03/06/2013    Past Surgical History:  Procedure Laterality Date  . CARDIAC  CATHETERIZATION    . EYE SURGERY Left 1990's   "result of trauma"   . FACIAL FRACTURE SURGERY Left 1990's   "result of trauma"   . FRACTURE SURGERY    . HERNIA REPAIR    . LEFT HEART CATHETERIZATION WITH CORONARY ANGIOGRAM Right 03/07/2013   Procedure: LEFT HEART CATHETERIZATION WITH CORONARY ANGIOGRAM;  Surgeon: [name], MD;  Location: [place] CATH LAB;  Service: Cardiovascular;  Laterality: Right;  . UMBILICAL HERNIA REPAIR     Family History:  Family History  Problem Relation Age of Onset  . Hypertension Mother   . Hypertension Father   . Hypertension Other   . Coronary artery disease Other    Family Psychiatric  History: Unknown  Social History:  Social History   Substance and Sexual Activity  Alcohol Use Yes     Social History   Substance and Sexual Activity  Drug Use Yes  . Types: Marijuana    Social History   Socioeconomic History  .  Marital status: Single    Spouse name: None  . Number of children: None  . Years of education: None  . Highest education level: None  Social Needs  . Financial resource strain: None  . Food insecurity - worry: None  . Food insecurity - inability: None  . Transportation needs - medical: None  . Transportation needs - non-medical: None  Occupational History  . None  Tobacco Use  . Smoking status: Current Every Day Smoker    Packs/day: 1.00    Types: Cigarettes  . Smokeless tobacco: Never Used  Substance and Sexual Activity  . Alcohol use: Yes  . Drug use: Yes    Types: Marijuana  . Sexual activity: Yes    Birth control/protection: None  Other Topics Concern  . None  Social History Narrative   ** Merged History Encounter **       Additional Social History:    Allergies:   Allergies  Allergen Reactions  . Robaxin [Methocarbamol] Other (See Comments)    "jumpy limbs"  . Shellfish-Derived Products Nausea And Vomiting  . Trazodone And Nefazodone Other (See Comments)    Reaction:  Muscle spasms  . Contrast  Media [Iodinated Diagnostic Agents] Hives  . Reglan [Metoclopramide] Other (See Comments)    Reaction:  Muscle spasms    Labs:  Results for orders placed or performed during the hospital encounter of 06/10/17 (from the past 48 hour(s))  Comprehensive metabolic panel     Status: Abnormal   Collection Time: 06/10/17  6:34 PM  Result Value Ref Range   Sodium 126 (L) 135 - 145 mmol/L   Potassium 3.2 (L) 3.5 - 5.1 mmol/L   Chloride 92 (L) 101 - 111 mmol/L   CO2 19 (L) 22 - 32 mmol/L   Glucose, Bld 95 65 - 99 mg/dL   BUN 7 6 - 20 mg/dL   Creatinine, Ser 0.64 0.61 - 1.24 mg/dL   Calcium 9.0 8.9 - 10.3 mg/dL   Total Protein 7.8 6.5 - 8.1 g/dL   Albumin 4.0 3.5 - 5.0 g/dL   AST 51 (H) 15 - 41 U/L   ALT 37 17 - 63 U/L   Alkaline Phosphatase 112 38 - 126 U/L   Total Bilirubin 0.9 0.3 - 1.2 mg/dL   GFR calc non Af Amer >60 >60 mL/min   GFR calc Af Amer >60 >60 mL/min    Comment: (NOTE) The eGFR has been calculated using the CKD EPI equation. This calculation has not been validated in all clinical situations. eGFR's persistently <60 mL/min signify possible Chronic Kidney Disease.    Anion gap 15 5 - 15    Comment: Performed at [facility] [address] [place], [place] [address]  cbc     Status: Abnormal   Collection Time: 06/10/17  6:34 PM  Result Value Ref Range   WBC 9.1 4.0 - 10.5 K/uL   RBC 4.21 (L) 4.22 - 5.81 MIL/uL   Hemoglobin 12.1 (L) 13.0 - 17.0 g/dL   HCT 36.1 (L) 39.0 - 52.0 %   MCV 85.7 78.0 - 100.0 fL   MCH 28.7 26.0 - 34.0 pg   MCHC 33.5 30.0 - 36.0 g/dL   RDW 17.6 (H) 11.5 - 15.5 %   Platelets 375 150 - 400 K/uL    Comment: Performed at [facility], [place] [address], [place], [place] [address]  Rapid urine drug screen (hospital performed)     Status: Abnormal   Collection Time: 06/10/17  6:34 PM  Result Value Ref Range   Opiates NONE DETECTED NONE DETECTED   Cocaine NONE DETECTED NONE DETECTED   Benzodiazepines NONE DETECTED NONE DETECTED    Amphetamines NONE DETECTED NONE DETECTED   Tetrahydrocannabinol NONE DETECTED NONE DETECTED   Barbiturates POSITIVE (A) NONE DETECTED    Comment: (NOTE) DRUG SCREEN FOR MEDICAL PURPOSES ONLY.  IF CONFIRMATION IS NEEDED FOR ANY PURPOSE, NOTIFY LAB WITHIN 5 DAYS. LOWEST DETECTABLE LIMITS FOR URINE DRUG SCREEN Drug Class                     Cutoff (ng/mL) Amphetamine and metabolites    1000 Barbiturate and metabolites    200 Benzodiazepine                 488 Tricyclics and metabolites     300 Opiates and metabolites        300 Cocaine and metabolites        300 THC                            50 Performed at [facility], [place] [address] [place], [place] [address]   Lipase, blood     Status: Abnormal   Collection Time: 06/10/17  6:50 PM  Result Value Ref Range   Lipase 59 (H) 11 - 51 U/L    Comment: Performed at [facility] [address] [place], [place] [address]  Ethanol     Status: None   Collection Time: 06/11/17  1:15 AM  Result Value Ref Range   Alcohol, Ethyl (B) <10 <10 mg/dL    Comment:        LOWEST DETECTABLE LIMIT FOR SERUM ALCOHOL IS 10 mg/dL FOR MEDICAL PURPOSES ONLY Performed at [facility], [place] [address], [place], [place] [address]   Salicylate level     Status: None   Collection Time: 06/11/17  1:15 AM  Result Value Ref Range   Salicylate Lvl <0.3 2.8 - 30.0 mg/dL    Comment: Performed at [facility] [address] [place], [place] [address]  Acetaminophen level     Status: None   Collection Time: 06/11/17  1:15 AM  Result Value Ref Range   Acetaminophen (Tylenol), Serum 12 10 - 30 ug/mL    Comment:        THERAPEUTIC CONCENTRATIONS VARY SIGNIFICANTLY. A RANGE OF 10-30 ug/mL MAY BE AN EFFECTIVE CONCENTRATION FOR MANY PATIENTS. HOWEVER, SOME ARE BEST TREATED AT CONCENTRATIONS OUTSIDE THIS RANGE. ACETAMINOPHEN CONCENTRATIONS >150 ug/mL AT 4 HOURS AFTER INGESTION AND >50 ug/mL AT 12 HOURS AFTER INGESTION ARE OFTEN  ASSOCIATED WITH TOXIC REACTIONS. Performed at [facility], [place] [address] [place], [place] [address]   Basic metabolic panel     Status: Abnormal   Collection Time: 06/11/17  6:13 AM  Result Value Ref Range   Sodium 131 (L) 135 - 145 mmol/L   Potassium 4.5 3.5 - 5.1 mmol/L   Chloride 104 101 - 111 mmol/L   CO2 16 (L) 22 - 32 mmol/L   Glucose, Bld 93 65 - 99 mg/dL   BUN 5 (L) 6 - 20 mg/dL   Creatinine, Ser 0.56 (L) 0.61 - 1.24 mg/dL   Calcium 8.7 (L) 8.9 - 10.3 mg/dL   GFR calc non Af Amer >60 >60 mL/min   GFR calc Af Amer >60 >60 mL/min    Comment: (NOTE) The  eGFR has been calculated using the CKD EPI equation. This calculation has not been validated in all clinical situations. eGFR's persistently <60 mL/min signify possible Chronic Kidney Disease.    Anion gap 11 5 - 15    Comment: Performed at [facility] [address] [place], [place] [address]    Medications:  Current Facility-Administered Medications  Medication Dose Route Frequency Provider Last Rate Last Dose  . acetaminophen (TYLENOL) tablet 650 mg  650 mg Oral Q6H PRN Elnora Morrison, MD   650 mg at 06/11/17 0557  . alum & mag hydroxide-simeth (MAALOX/MYLANTA) 200-200-20 MG/5ML suspension 30 mL  30 mL Oral Q6H PRN Palumbo, April, MD   30 mL at 06/11/17 0428  . gabapentin (NEURONTIN) capsule 300 mg  300 mg Oral BID Elnora Morrison, MD   300 mg at 06/11/17 0938  . hydrALAZINE (APRESOLINE) tablet 25 mg  25 mg Oral QHS Elnora Morrison, MD   25 mg at 06/10/17 2248  . ibuprofen (ADVIL,MOTRIN) tablet 600 mg  600 mg Oral Q6H PRN Elnora Morrison, MD   600 mg at 06/11/17 0557  . lipase/protease/amylase (CREON) capsule 24,000 Units  24,000 Units Oral TID WC Elnora Morrison, MD   24,000 Units at 06/11/17 0913  . metoprolol tartrate (LOPRESSOR) tablet 25 mg  25 mg Oral BID Elnora Morrison, MD   25 mg at 06/11/17 0914  . nicotine (NICODERM CQ - dosed in mg/24 hours) patch 21 mg  21 mg Transdermal Daily Palumbo, April, MD       . ondansetron [facility]) tablet 4 mg  4 mg Oral Q8H PRN Palumbo, April, MD      . pantoprazole (PROTONIX) EC tablet 40 mg  40 mg Oral Daily Elnora Morrison, MD   40 mg at 06/11/17 0914  . sertraline (ZOLOFT) tablet 75 mg  75 mg Oral Daily Elnora Morrison, MD   75 mg at 06/10/17 2356  . sodium chloride 0.9 % bolus 1,000 mL  1,000 mL Intravenous Once Dorie Rank, MD      . sucralfate (CARAFATE) tablet 1 g  1 g Oral TID WC & HS Elnora Morrison, MD   1 g at 06/11/17 1829   Current Outpatient Medications  Medication Sig Dispense Refill  . albuterol (PROVENTIL HFA;VENTOLIN HFA) 108 (90 Base) MCG/ACT inhaler Inhale 2 puffs into the lungs every 6 (six) hours as needed for wheezing or shortness of breath.    . gabapentin (NEURONTIN) 300 MG capsule Take 1 capsule (300 mg total) by mouth 3 (three) times daily. For agitation (Patient taking differently: Take 300 mg by mouth 2 (two) times daily. ) 90 capsule 0  . hydrALAZINE (APRESOLINE) 25 MG tablet Take 1 tablet (25 mg total) by mouth every 8 (eight) hours. (Patient taking differently: Take 25 mg by mouth at bedtime. ) 90 tablet 1  . lipase/protease/amylase (CREON) 12000 units CPEP capsule Take 2 capsules (24,000 Units total) by mouth 3 (three) times daily with meals. For pancreatitis 90 capsule 0  . loratadine (CLARITIN) 10 MG tablet Take 1 tablet (10 mg total) by mouth daily. (May purchase from over the counter): For allergies 30 tablet 0  . Menthol, Topical Analgesic, (BENGAY EX) Apply 1 application topically 3 (three) times daily as needed (joint pain).    . metoprolol tartrate (LOPRESSOR) 25 MG tablet Take 1 tablet (25 mg total) by mouth 2 (two) times daily. For high blood pressure 30 tablet 0  . ondansetron (ZOFRAN) 4 MG tablet Take 1 tablet (4 mg total) by  mouth every 6 (six) hours. 10 tablet 0  . oxyCODONE (ROXICODONE) 5 MG immediate release tablet Take 1 tablet (5 mg total) by mouth every 8 (eight) hours as needed. 10 tablet 0  . pantoprazole (PROTONIX)  40 MG tablet Take 1 tablet (40 mg total) by mouth daily. 30 tablet 1  . Polyethyl Glycol-Propyl Glycol (SYSTANE OP) Place 1 drop into both eyes daily.    . sertraline (ZOLOFT) 25 MG tablet Take 3 tablets (75 mg total) by mouth daily. For depression 90 tablet 0  . sucralfate (CARAFATE) 1 g tablet Take 1 tablet (1 g total) by mouth 4 (four) times daily -  with meals and at bedtime. 90 tablet 0  . thiamine 100 MG tablet Take 1 tablet (100 mg total) by mouth daily. 30 tablet 1    Musculoskeletal: UTA via camera  Psychiatric Specialty Exam: Physical Exam  Nursing note and vitals reviewed.   Review of Systems  Psychiatric/Behavioral: Positive for depression, substance abuse and suicidal ideas. Negative for hallucinations and memory loss. The patient is not nervous/anxious and does not have insomnia.   All other systems reviewed and are negative.   Blood pressure 117/89, pulse 100, temperature 98 F (36.7 C), temperature source Oral, resp. rate 18, height 5' 9" (1.753 m), weight 63.5 kg (140 lb), SpO2 99 %.Body mass index is 20.67 kg/m.  General Appearance: on hospital scrub  Eye Contact:  Good  Speech:  Clear and Coherent and Normal Rate  Volume:  Normal  Mood:  Anxious and Depressed  Affect:  Appropriate and Congruent  Thought Process:  Coherent and Goal Directed  Orientation:  Full (Time, Place, and Person)  Thought Content:  WDL and Logical  Suicidal Thoughts:  No  Homicidal Thoughts:  No  Memory:  Immediate;   Good Recent;   Good Remote;   Fair  Judgement:  Intact  Insight:  Present  Psychomotor Activity:  Normal  Concentration:  Concentration: Good and Attention Span: Good  Recall:  Good  Fund of Knowledge:  Good  Language:  Good  Akathisia:  Negative  Handed:  Right  AIMS (if indicated):     Assets:  Communication Skills Desire for Improvement Housing Leisure Time  ADL's:  Intact  Cognition:  WNL  Sleep:      Treatment Plan recommendations as discussed and agreed  with Dr. Dwyane Dee:  Treatment Plan Summary: Plan to discharge patient when medically cleared. Follow up with [facility] for therapy and medication management Follow up with Social Work consult for Care coordination Take all medications as prescribed Avoid the use of alcohol and/or drugs Stay well hydrated Activity as tolerated Follow up with PCP for any new or existing medical concerns  Disposition: No evidence of imminent risk to self or others at present.   Patient does not meet criteria for psychiatric inpatient admission. Supportive therapy provided about ongoing stressors. Refer to IOP. Discussed crisis plan, support from social network, calling 911, coming to the Emergency Department, and calling Suicide Hotline.  This service was provided via telemedicine using a 2-way, interactive audio and video technology.  Names of all persons participating in this telemedicine service and their role in this encounter. Name: Enid Derry Role: Patient  Name: Jamir Rone A. Nevin Kozuch  Role: NP           Vicenta Aly, NP 06/11/2017 10:20 AM

## 2017-06-11 NOTE — ED Notes (Addendum)
Contacted by poison control regarding BMP results. Given level of CO2 do not recommend medical clearance at this time, recommend fluid bolus. Attempting to notify MD

## 2017-06-11 NOTE — Discharge Instructions (Signed)
Follow-up with the resources provided by the mental health team

## 2017-06-11 NOTE — ED Notes (Signed)
Introduced self to pt, discussed plan of care and events leading to admission. When asked about how much Aleve was ingested, pt stated that he spit out the medication. Also stated last alcohol consumed was 2 24oz beer yesterday afternoon between 12-3.

## 2017-06-11 NOTE — ED Provider Notes (Addendum)
Notified that poison control recommends fluid and repeat bicarb because of his lab results.  Old records reviewed however and pt's bicarb was 16 back in January.  This is not a new thing with this possible overdose.   Will give another liter of fluid and we can recheck to make sure it is not worsening.   Dorie Rank, MD 06/11/17 [phone]  Notified that pt is medically clear from a psychiatric standpoint.   IVC rescinded.  Will wait on repeat BMET at noon   Dorie Rank, MD 06/11/17 1050  Bicarb has improved.  Vitals are stable.  Pt is ready for discharge.  He would like to go home.   Dorie Rank, MD 06/11/17 [phone]

## 2017-06-11 NOTE — ED Provider Notes (Signed)
[facility] DEPT Provider Note   CSN: [id number] Arrival date & time: 06/09/17  1029     History   Chief Complaint Chief Complaint  Patient presents with  . Abdominal Pain    HPI [name] is a 47 y.o. male.  HPI 47 yo male presenting with constant abdominal pain for 24 hours. Located in his upper abdomen. Long standing hx of recurrent/chronic abdominal pain and well known to myself and this department. Pt reports decreased ETOH intake. Denies blood in his vomit. No diarrhea. abd pain is 10/10.  Currently out of his protonix and his carafate. No recent EGD. No fevers. Denies CP and SOB   Past Medical History:  Diagnosis Date  . Alcoholism /alcohol abuse ([place])   . Anemia   . Anxiety   . Arthritis    "knees; arms; elbows" (03/26/2015)  . Asthma   . Bipolar disorder ([place])   . Chronic bronchitis ([place])   . Chronic lower back pain   . Cocaine abuse ([place])   . Depression   . Family history of adverse reaction to anesthesia    "grandmother gets confused"  . Femoral condyle fracture ([place]) 03/08/2014   left medial/notes 03/09/2014  . GERD (gastroesophageal reflux disease)   . H/O hiatal hernia   . H/O suicide attempt 10/2012  . Heart murmur    "when he was little" (03/06/2013)  . High cholesterol   . History of blood transfusion 10/2012   "when I tried to commit suicide"  . History of stomach ulcers   . Hypertension   . Marijuana abuse, continuous   . Migraine    "a few times/year" (03/26/2015)  . Pancreatitis   . Pneumonia 1990's X 3  . PTSD (post-traumatic stress disorder)   . Shortness of breath    "can happen at anytime" (03/06/2013)  . Sickle cell trait ([place])   . WPW (Wolff-Parkinson-White syndrome)    [name] 03/06/2013    Patient Active Problem List   Diagnosis Date Noted  . Abdominal pain 05/27/2017  . Hematemesis 05/27/2017  . Acute on chronic pancreatitis ([place]) 12/17/2016  . Chronic alcoholic pancreatitis ([place])  12/05/2016  . Intractable nausea and vomiting 12/05/2016  . Verbally abusive behavior 12/05/2016  . Normocytic anemia 12/05/2016  . Alcohol use disorder, severe, dependence ([place]) 07/25/2016  . Cocaine use disorder, severe, dependence ([place]) 07/25/2016  . Major depressive disorder, recurrent severe without psychotic features ([place]) 07/20/2016  . Leukocytosis   . Hospital acquired PNA 05/20/2015  . Pancreatitis 05/18/2015  . Pseudocyst of pancreas 05/18/2015  . Polysubstance abuse (tobacco, cocaine, THC, and ETOH) 03/26/2015  . Alcohol-induced chronic pancreatitis ([place])   . Benign essential HTN 02/06/2014  . Alcohol-induced acute pancreatitis 11/28/2013  . Pancreatic pseudocyst/cyst 11/25/2013  . MDD (major depressive disorder), recurrent severe, without psychosis ([place]) 10/22/2013  . Severe protein-calorie malnutrition ([place]) 10/10/2013  . Suicide attempt ([place]) 10/08/2013  . 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 TOBACCO ABUSE 03/23/2007    Past Surgical History:  Procedure Laterality Date  . CARDIAC CATHETERIZATION    . EYE SURGERY Left 1990's   "result of trauma"   . FACIAL FRACTURE SURGERY Left 1990's   "result of trauma"   . FRACTURE SURGERY    . HERNIA REPAIR    . LEFT HEART CATHETERIZATION WITH CORONARY ANGIOGRAM Right 03/07/2013   Procedure: LEFT HEART CATHETERIZATION WITH CORONARY ANGIOGRAM;  Surgeon: [name], MD;  Location: [facility];  Service: Cardiovascular;  Laterality: Right;  . UMBILICAL HERNIA REPAIR         Home Medications    Prior to Admission medications   Medication Sig Start Date End Date Taking? Authorizing Provider  albuterol (PROVENTIL HFA;VENTOLIN HFA) 108 (90 Base) MCG/ACT inhaler Inhale 2 puffs into the lungs every 6 (six) hours as needed for wheezing or shortness of breath. 07/25/16  Yes Lindell Spar I, NP  gabapentin (NEURONTIN) 300 MG capsule Take 1 capsule (300 mg total) by mouth 3 (three) times daily. For  agitation Patient taking differently: Take 300 mg by mouth 2 (two) times daily.  07/25/16  Yes Lindell Spar I, NP  hydrALAZINE (APRESOLINE) 25 MG tablet Take 1 tablet (25 mg total) by mouth every 8 (eight) hours. Patient taking differently: Take 25 mg by mouth at bedtime.  12/29/16  Yes Barton Dubois, MD  lipase/protease/amylase (CREON) 12000 units CPEP capsule Take 2 capsules (24,000 Units total) by mouth 3 (three) times daily with meals. For pancreatitis 06/06/17  Yes [name], MD  loratadine (CLARITIN) 10 MG tablet Take 1 tablet (10 mg total) by mouth daily. (May purchase from over the counter): For allergies 07/26/16  Yes Nwoko, Herbert Pun I, NP  Menthol, Topical Analgesic, (BENGAY EX) Apply 1 application topically 3 (three) times daily as needed (joint pain).   Yes [provider]  metoprolol tartrate (LOPRESSOR) 25 MG tablet Take 1 tablet (25 mg total) by mouth 2 (two) times daily. For high blood pressure 12/29/16  Yes Barton Dubois, MD  oxyCODONE (ROXICODONE) 5 MG immediate release tablet Take 1 tablet (5 mg total) by mouth every 8 (eight) hours as needed. 05/30/17 05/30/18 Yes Georgette Shell, MD  Polyethyl Glycol-Propyl Glycol (SYSTANE OP) Place 1 drop into both eyes daily.   Yes [provider]  sertraline (ZOLOFT) 25 MG tablet Take 3 tablets (75 mg total) by mouth daily. For depression 07/26/16  Yes Lindell Spar I, NP  thiamine 100 MG tablet Take 1 tablet (100 mg total) by mouth daily. 12/30/16  Yes Barton Dubois, MD  ondansetron (ZOFRAN) 4 MG tablet Take 1 tablet (4 mg total) by mouth every 6 (six) hours. 06/09/17   [name], MD  pantoprazole (PROTONIX) 40 MG tablet Take 1 tablet (40 mg total) by mouth daily. 06/09/17   [name], MD  sucralfate (CARAFATE) 1 g tablet Take 1 tablet (1 g total) by mouth 4 (four) times daily -  with meals and at bedtime. 06/09/17   [name], MD    Family History Family History  Problem Relation Age of Onset  . Hypertension  Mother   . Hypertension Father   . Hypertension Other   . Coronary artery disease Other     Social History Social History   Tobacco Use  . Smoking status: Current Every Day Smoker    Packs/day: 1.00    Types: Cigarettes  . Smokeless tobacco: Never Used  Substance Use Topics  . Alcohol use: Yes  . Drug use: Yes    Types: Marijuana     Allergies   Robaxin [methocarbamol]; Shellfish-derived products; Trazodone and nefazodone; Contrast media [iodinated diagnostic agents]; and Reglan [metoclopramide]   Review of Systems Review of Systems  All other systems reviewed and are negative.    Physical Exam Updated Vital Signs BP (!) 139/97   Pulse (!) 112   Temp (!) 97.4 F (36.3 C) (Oral)   Resp 18   SpO2 100%   Physical Exam  Constitutional: He is oriented to person, place, and  time. He appears well-developed and well-nourished.  HENT:  Head: Normocephalic and atraumatic.  Eyes: EOM are normal.  Neck: Normal range of motion.  Cardiovascular: Normal rate, regular rhythm, normal heart sounds and intact distal pulses.  Pulmonary/Chest: Effort normal and breath sounds normal. No respiratory distress.  Abdominal: Soft. He exhibits no distension. There is no tenderness.  Musculoskeletal: Normal range of motion.  Neurological: He is alert and oriented to person, place, and time.  Skin: Skin is warm and dry.  Psychiatric: He has a normal mood and affect. Judgment normal.  Nursing note and vitals reviewed.    ED Treatments / Results  Labs (all labs ordered are listed, but only abnormal results are displayed) Labs Reviewed  COMPREHENSIVE METABOLIC PANEL - Abnormal; Notable for the following components:      Result Value   Total Protein 9.0 (*)    AST 75 (*)    All other components within normal limits  CBC - Abnormal; Notable for the following components:   Hemoglobin 12.7 (*)    HCT 37.6 (*)    RDW 17.8 (*)    All other components within normal limits  URINALYSIS,  ROUTINE W REFLEX MICROSCOPIC - Abnormal; Notable for the following components:   Ketones, ur 5 (*)    All other components within normal limits  LIPASE, BLOOD    EKG  EKG Interpretation None       Radiology No results found.  Procedures Procedures (including critical care time)  Medications Ordered in ED Medications  ondansetron (ZOFRAN-ODT) disintegrating tablet 8 mg (8 mg Oral Given 06/09/17 1231)  alum & mag hydroxide-simeth (MAALOX/MYLANTA) 200-200-20 MG/5ML suspension 30 mL (30 mLs Oral Given 06/09/17 1231)  ketorolac (TORADOL) 15 MG/ML injection 15 mg (15 mg Intravenous Given 06/09/17 1429)     Initial Impression / Assessment and Plan / ED Course  I have reviewed the triage vital signs and the nursing notes.  Pertinent labs & imaging results that were available during my care of the patient were reviewed by me and considered in my medical decision making (see chart for details).     Reassuring labs. Repeat abdominal exam without significant tenderness. No indication for advanced imaging. Tolerating fluids. Dc home with pcp and GI follow up. No other complaints  Final Clinical Impressions(s) / ED Diagnoses   Final diagnoses:  Acute abdominal pain    ED Discharge Orders        Ordered    ondansetron (ZOFRAN) 4 MG tablet  Every 6 hours     06/09/17 1409    sucralfate (CARAFATE) 1 g tablet  3 times daily with meals & bedtime     06/09/17 1409    pantoprazole (PROTONIX) 40 MG tablet  Daily     06/09/17 Funk, Damon Hargrove, MD 06/11/17 [phone]

## 2017-06-11 NOTE — Care Management (Signed)
ED CM met with patient at bedside concerning transitional care planning. ED CM met with patient at bedside, patient confirms being active with [facility]. Patient instructed to go to [facility] tomorrow for assistance with medications, he states, he has medications at home but will need assistance with one in which he is running low on patient verbalized understanding as per transitional care plan teach back done. Patient also states he has a court appearance in the morning but will go to [facility] after he is finished. ED CM will notify Medical Staff at [facility] of  patient's discharge No further ED CM needs identified.

## 2017-06-11 NOTE — ED Notes (Signed)
Patient continues to call out frequently for "something for pain" and requesting his "narcotics". Because patient has been IVC'd, his RX of hydrocodone IR was sent to pharmacy. After discussion with MD, Arnette Schaumann advised that patient will receive no narcotics, only Tylenol and Ibuprofen as well has his home medications. Patient advised of this plan and was not happy. Continues to call out and ask for pain medications. Reiterated multiple times that next pain med is due at 06:00a.m. 1:1 Sitter at bedside. Will continue to monitor.

## 2017-06-11 NOTE — ED Notes (Signed)
Patient removed all Monitor equipment.

## 2017-06-11 NOTE — Progress Notes (Signed)
CSW spoke with pt's mother, [name] [phone], who states that she is comfortable with pt discharging home today. She reports that there are no guns/weapons in the home. "I didn't even know he called EMS for help until they got here to pick him up." She has no other safety concerns but requested that pt be provided with a taxi voucher rather than bus pass. "I don't think he will be able to navigate the bus system to get back here." Per [name] NP, pt is psychiatrically cleared for discharge. CSW notified [name] and Scientific laboratory technician at [facility] of disposition.  [name], MSW, LCSW Clinical Social Worker 06/11/2017 10:35 AM

## 2017-06-11 NOTE — ED Notes (Signed)
Patient asked to speak to MD Palumbo. Patient requested narcotics, or ketamine for his pain. Md stated not at this time due to intentional overdose ingestion. Request GI cocktail which provider states will order.

## 2017-06-11 NOTE — ED Notes (Signed)
Patient awoke requesting pain medication; patient was reminded that at 06:00 his meds (ibuprofen and tylenol) would be able to be dispensed again.

## 2017-06-11 NOTE — ED Notes (Signed)
Patient states that leg is "jumping" because he has not been on his seroquel. Asking for ativan. Md notified.

## 2017-06-12 ENCOUNTER — Emergency Department (HOSPITAL_COMMUNITY)
Admission: EM | Admit: 2017-06-12 | Discharge: 2017-06-12 | Disposition: A | Discharge status: Home / Self Care | Payer: Self-pay | Attending: Emergency Medicine | Admitting: Emergency Medicine

## 2017-06-12 DIAGNOSIS — R109 Unspecified abdominal pain: Secondary | ICD-10-CM | POA: Insufficient documentation

## 2017-06-12 DIAGNOSIS — Z5321 Procedure and treatment not carried out due to patient leaving prior to being seen by health care provider: Secondary | ICD-10-CM | POA: Insufficient documentation

## 2017-06-12 NOTE — ED Notes (Signed)
No answer from lobby  

## 2017-06-12 NOTE — ED Notes (Signed)
Patient going outside to smoke-no issues ambulating, does not appear to be in any distress

## 2017-06-12 NOTE — ED Triage Notes (Signed)
Per EMS-states patient is having muscle spasms in both lower extremities and right arm-also complaining of abdominal pain when spasms occur

## 2017-06-13 ENCOUNTER — Encounter (HOSPITAL_COMMUNITY): Payer: Self-pay | Admitting: Emergency Medicine

## 2017-06-13 DIAGNOSIS — F17210 Nicotine dependence, cigarettes, uncomplicated: Secondary | ICD-10-CM | POA: Insufficient documentation

## 2017-06-13 DIAGNOSIS — I10 Essential (primary) hypertension: Secondary | ICD-10-CM | POA: Insufficient documentation

## 2017-06-13 DIAGNOSIS — J45909 Unspecified asthma, uncomplicated: Secondary | ICD-10-CM | POA: Insufficient documentation

## 2017-06-13 DIAGNOSIS — Z79899 Other long term (current) drug therapy: Secondary | ICD-10-CM | POA: Insufficient documentation

## 2017-06-13 DIAGNOSIS — K852 Alcohol induced acute pancreatitis without necrosis or infection: Secondary | ICD-10-CM | POA: Insufficient documentation

## 2017-06-13 LAB — URINALYSIS, ROUTINE W REFLEX MICROSCOPIC
BILIRUBIN URINE: NEGATIVE
GLUCOSE, UA: NEGATIVE mg/dL
HGB URINE DIPSTICK: NEGATIVE
KETONES UR: NEGATIVE mg/dL
Leukocytes, UA: NEGATIVE
Nitrite: NEGATIVE
PH: 5 (ref 5.0–8.0)
Protein, ur: NEGATIVE mg/dL
SPECIFIC GRAVITY, URINE: 1.016 (ref 1.005–1.030)

## 2017-06-13 LAB — CBC
HEMATOCRIT: 30.3 % — AB (ref 39.0–52.0)
HEMOGLOBIN: 10 g/dL — AB (ref 13.0–17.0)
MCH: 28.4 pg (ref 26.0–34.0)
MCHC: 33 g/dL (ref 30.0–36.0)
MCV: 86.1 fL (ref 78.0–100.0)
Platelets: 316 10*3/uL (ref 150–400)
RBC: 3.52 MIL/uL — ABNORMAL LOW (ref 4.22–5.81)
RDW: 17.6 % — AB (ref 11.5–15.5)
WBC: 12.4 10*3/uL — ABNORMAL HIGH (ref 4.0–10.5)

## 2017-06-13 LAB — COMPREHENSIVE METABOLIC PANEL
ALT: 33 U/L (ref 17–63)
ANION GAP: 13 (ref 5–15)
AST: 42 U/L — AB (ref 15–41)
Albumin: 3.7 g/dL (ref 3.5–5.0)
Alkaline Phosphatase: 97 U/L (ref 38–126)
BILIRUBIN TOTAL: 0.3 mg/dL (ref 0.3–1.2)
BUN: 7 mg/dL (ref 6–20)
CO2: 22 mmol/L (ref 22–32)
Calcium: 9.3 mg/dL (ref 8.9–10.3)
Chloride: 100 mmol/L — ABNORMAL LOW (ref 101–111)
Creatinine, Ser: 0.68 mg/dL (ref 0.61–1.24)
GFR calc Af Amer: >60 mL/min (ref >60)
GFR calc non Af Amer: >60 mL/min (ref >60)
GLUCOSE: 81 mg/dL (ref 65–99)
POTASSIUM: 3.5 mmol/L (ref 3.5–5.1)
SODIUM: 135 mmol/L (ref 135–145)
Total Protein: 7 g/dL (ref 6.5–8.1)

## 2017-06-13 LAB — LIPASE, BLOOD: Lipase: 395 U/L — ABNORMAL HIGH (ref 11–51)

## 2017-06-13 NOTE — ED Triage Notes (Signed)
To triage via EMS.  Onset 2 days muscle spasms.  Seen at [facility] yesterday.

## 2017-06-13 NOTE — ED Triage Notes (Signed)
Pt arrives via EMS from home with c/o muscle spasms "Leg jumping" for two days. Ambulator without difficulty or assistance. Also reporting abdominal pain that he attributes to hx of pancreatitis.

## 2017-06-14 ENCOUNTER — Emergency Department (HOSPITAL_COMMUNITY)
Admission: EM | Admit: 2017-06-14 | Discharge: 2017-06-14 | Disposition: A | Discharge status: Home / Self Care | Payer: Self-pay | Attending: Emergency Medicine | Admitting: Emergency Medicine

## 2017-06-14 DIAGNOSIS — K852 Alcohol induced acute pancreatitis without necrosis or infection: Secondary | ICD-10-CM

## 2017-06-14 MED ORDER — HALOPERIDOL LACTATE 5 MG/ML IJ SOLN
5.0000 mg | Freq: Once | INTRAMUSCULAR | Status: AC
  Administered 2017-06-14: 5 mg via INTRAMUSCULAR
  Filled 2017-06-14: qty 1

## 2017-06-14 MED ORDER — DICYCLOMINE HCL 20 MG PO TABS: 20.0000 mg | ORAL_TABLET | Freq: Two times a day (BID) | ORAL | 0 refills | Status: DC

## 2017-06-14 MED ORDER — SUCRALFATE 1 G PO TABS
1.0000 g | ORAL_TABLET | Freq: Once | ORAL | Status: AC
  Administered 2017-06-14: 1 g via ORAL
  Filled 2017-06-14: qty 1

## 2017-06-14 MED ORDER — KETOROLAC TROMETHAMINE 30 MG/ML IJ SOLN
30.0000 mg | Freq: Once | INTRAMUSCULAR | Status: AC
  Administered 2017-06-14: 30 mg via INTRAMUSCULAR
  Filled 2017-06-14: qty 1

## 2017-06-14 MED ORDER — GI COCKTAIL ~~LOC~~
30.0000 mL | Freq: Once | ORAL | Status: AC
  Administered 2017-06-14: 30 mL via ORAL
  Filled 2017-06-14: qty 30

## 2017-06-14 NOTE — ED Notes (Signed)
E-signature unavailable, pt verbalized understanding of DC instructions and prescriptions

## 2017-06-14 NOTE — ED Notes (Signed)
Pt up to nurse first questing updates. Provided answers to questions and apologized for wait. Pt wanting to know how many people are in front of him in lobby. RN informed him that RN doesn't give out that information because people coming in the front door at any point maybe bumped in front of , explained acuity. Pt then stated "ya'll racist."
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
## 2017-07-03 ENCOUNTER — Emergency Department (HOSPITAL_COMMUNITY)
Admission: EM | Admit: 2017-07-03 | Discharge: 2017-07-03 | Disposition: A | Discharge status: Home / Self Care | Payer: Self-pay | Attending: Emergency Medicine | Admitting: Emergency Medicine

## 2017-07-03 ENCOUNTER — Other Ambulatory Visit: Payer: Self-pay

## 2017-07-03 ENCOUNTER — Encounter (HOSPITAL_COMMUNITY): Payer: Self-pay | Admitting: Emergency Medicine

## 2017-07-03 DIAGNOSIS — R109 Unspecified abdominal pain: Secondary | ICD-10-CM

## 2017-07-03 DIAGNOSIS — Z79899 Other long term (current) drug therapy: Secondary | ICD-10-CM | POA: Insufficient documentation

## 2017-07-03 DIAGNOSIS — I10 Essential (primary) hypertension: Secondary | ICD-10-CM | POA: Insufficient documentation

## 2017-07-03 DIAGNOSIS — J45909 Unspecified asthma, uncomplicated: Secondary | ICD-10-CM | POA: Insufficient documentation

## 2017-07-03 DIAGNOSIS — R079 Chest pain, unspecified: Secondary | ICD-10-CM | POA: Insufficient documentation

## 2017-07-03 DIAGNOSIS — F17210 Nicotine dependence, cigarettes, uncomplicated: Secondary | ICD-10-CM | POA: Insufficient documentation

## 2017-07-03 DIAGNOSIS — R1012 Left upper quadrant pain: Secondary | ICD-10-CM | POA: Insufficient documentation

## 2017-07-03 DIAGNOSIS — I456 Pre-excitation syndrome: Secondary | ICD-10-CM | POA: Insufficient documentation

## 2017-07-03 LAB — LIPASE, BLOOD: LIPASE: 56 U/L — AB (ref 11–51)

## 2017-07-03 LAB — CBC WITH DIFFERENTIAL/PLATELET
Basophils Absolute: 0 10*3/uL (ref 0.0–0.1)
Basophils Relative: 0 %
Eosinophils Absolute: 0.7 10*3/uL (ref 0.0–0.7)
Eosinophils Relative: 7 %
HEMATOCRIT: 31.4 % — AB (ref 39.0–52.0)
Hemoglobin: 10.2 g/dL — ABNORMAL LOW (ref 13.0–17.0)
LYMPHS ABS: 3.2 10*3/uL (ref 0.7–4.0)
LYMPHS PCT: 29 %
MCH: 28.3 pg (ref 26.0–34.0)
MCHC: 32.5 g/dL (ref 30.0–36.0)
MCV: 87 fL (ref 78.0–100.0)
Monocytes Absolute: 0.5 10*3/uL (ref 0.1–1.0)
Monocytes Relative: 4 %
NEUTROS PCT: 60 %
Neutro Abs: 6.6 10*3/uL (ref 1.7–7.7)
PLATELETS: 323 10*3/uL (ref 150–400)
RBC: 3.61 MIL/uL — AB (ref 4.22–5.81)
RDW: 16.6 % — AB (ref 11.5–15.5)
WBC: 11 10*3/uL — AB (ref 4.0–10.5)

## 2017-07-03 LAB — I-STAT CHEM 8, ED
BUN: 5 mg/dL — ABNORMAL LOW (ref 6–20)
CHLORIDE: 107 mmol/L (ref 101–111)
CREATININE: 0.5 mg/dL — AB (ref 0.61–1.24)
Calcium, Ion: 1.11 mmol/L — ABNORMAL LOW (ref 1.15–1.40)
GLUCOSE: 116 mg/dL — AB (ref 65–99)
HCT: 37 % — ABNORMAL LOW (ref 39.0–52.0)
HEMOGLOBIN: 12.6 g/dL — AB (ref 13.0–17.0)
POTASSIUM: 3.4 mmol/L — AB (ref 3.5–5.1)
Sodium: 143 mmol/L (ref 135–145)
TCO2: 23 mmol/L (ref 22–32)

## 2017-07-03 LAB — I-STAT TROPONIN, ED
TROPONIN I, POC: 0 ng/mL (ref 0.00–0.08)
Troponin i, poc: 0 ng/mL (ref 0.00–0.08)

## 2017-07-03 LAB — URINALYSIS, ROUTINE W REFLEX MICROSCOPIC
Bilirubin Urine: NEGATIVE
GLUCOSE, UA: NEGATIVE mg/dL
HGB URINE DIPSTICK: NEGATIVE
Ketones, ur: NEGATIVE mg/dL
LEUKOCYTES UA: NEGATIVE
Nitrite: NEGATIVE
PH: 5 (ref 5.0–8.0)
Protein, ur: NEGATIVE mg/dL
Specific Gravity, Urine: 1.02 (ref 1.005–1.030)

## 2017-07-03 LAB — RAPID URINE DRUG SCREEN, HOSP PERFORMED
Amphetamines: NOT DETECTED
BARBITURATES: NOT DETECTED
BENZODIAZEPINES: POSITIVE — AB
COCAINE: NOT DETECTED
OPIATES: POSITIVE — AB
Tetrahydrocannabinol: NOT DETECTED

## 2017-07-03 LAB — ETHANOL: Alcohol, Ethyl (B): <10 mg/dL (ref <10)

## 2017-07-03 MED ORDER — PROMETHAZINE HCL 25 MG/ML IJ SOLN
25.0000 mg | Freq: Once | INTRAMUSCULAR | Status: AC
  Administered 2017-07-03: 25 mg via INTRAVENOUS
  Filled 2017-07-03: qty 1

## 2017-07-03 MED ORDER — LORAZEPAM 2 MG/ML IJ SOLN
2.0000 mg | Freq: Once | INTRAMUSCULAR | Status: AC
  Administered 2017-07-03: 2 mg via INTRAVENOUS
  Filled 2017-07-03: qty 1

## 2017-07-03 MED ORDER — SODIUM CHLORIDE 0.9 % IV BOLUS (SEPSIS)
1000.0000 mL | Freq: Once | INTRAVENOUS | Status: AC
  Administered 2017-07-03: 1000 mL via INTRAVENOUS

## 2017-07-03 MED ORDER — GI COCKTAIL ~~LOC~~
30.0000 mL | Freq: Once | ORAL | Status: AC
  Administered 2017-07-03: 30 mL via ORAL
  Filled 2017-07-03: qty 30

## 2017-07-03 NOTE — ED Notes (Signed)
ED Provider at bedside. 

## 2017-07-03 NOTE — ED Provider Notes (Signed)
[facility] EMERGENCY DEPARTMENT Provider Note   CSN: [id number] Arrival date & time: 07/03/17  1652     History   Chief Complaint Chief Complaint  Patient presents with  . Chest Pain  . Abdominal Pain    HPI [name] is a 47 y.o. male.  HPI  47 year old male history of chronic alcohol abuse/pancreatitis, prior history of withdrawal, cocaine abuse, bipolar disorder, Wolff-Parkinson-White pending ablation next month is not on antiarrhythmics presents the emergency department with 2 hours of left upper quadrant abdominal discomfort associate with nausea/vomiting/diarrhea and 1 hour of left-sided chest discomfort similar to his prior WPW/tachycardia.  Patient denies any recent syncopal event, recent illness.  Patient's last drink times 48 hours ago.  Patient denies any fever or shortness of breath.   Past Medical History:  Diagnosis Date  . Alcoholism /alcohol abuse ([place])   . Anemia   . Anxiety   . Arthritis    "knees; arms; elbows" (03/26/2015)  . Asthma   . Bipolar disorder ([place])   . Chronic bronchitis ([place])   . Chronic lower back pain   . Cocaine abuse ([place])   . Depression   . Family history of adverse reaction to anesthesia    "grandmother gets confused"  . Femoral condyle fracture ([place]) 03/08/2014   left medial/notes 03/09/2014  . GERD (gastroesophageal reflux disease)   . H/O hiatal hernia   . H/O suicide attempt 10/2012  . Heart murmur    "when he was little" (03/06/2013)  . High cholesterol   . History of blood transfusion 10/2012   "when I tried to commit suicide"  . History of stomach ulcers   . Hypertension   . Marijuana abuse, continuous   . Migraine    "a few times/year" (03/26/2015)  . Pancreatitis   . Pneumonia 1990's X 3  . PTSD (post-traumatic stress disorder)   . Shortness of breath    "can happen at anytime" (03/06/2013)  . Sickle cell trait ([place])   . WPW (Wolff-Parkinson-White syndrome)    [name] 03/06/2013     Patient Active Problem List   Diagnosis Date Noted  . Abdominal pain 05/27/2017  . Hematemesis 05/27/2017  . Tachycardia 03/18/2017  . Nausea & vomiting 03/18/2017  . Diarrhea 03/18/2017  . Acute on chronic pancreatitis ([place]) 12/17/2016  . Chronic alcoholic pancreatitis ([place]) 12/05/2016  . Intractable nausea and vomiting 12/05/2016  . Verbally abusive behavior 12/05/2016  . Normocytic anemia 12/05/2016  . Alcohol use disorder, severe, dependence ([place]) 07/25/2016  . Cocaine use disorder, severe, dependence ([place]) 07/25/2016  . Major depressive disorder, recurrent severe without psychotic features ([place]) 07/20/2016  . Leukocytosis   . Hospital acquired PNA 05/20/2015  . Pancreatitis 05/18/2015  . Pseudocyst of pancreas 05/18/2015  . Polysubstance abuse (tobacco, cocaine, THC, and ETOH) 03/26/2015  . Alcohol-induced chronic pancreatitis ([place])   . Benign essential HTN 02/06/2014  . Alcohol-induced acute pancreatitis 11/28/2013  . Pancreatic pseudocyst/cyst 11/25/2013  . MDD (major depressive disorder), recurrent severe, without psychosis ([place]) 10/22/2013  . Severe protein-calorie malnutrition ([place]) 10/10/2013  . Suicide attempt ([place]) 10/08/2013  . 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 TOBACCO ABUSE 03/23/2007    Past Surgical History:  Procedure Laterality Date  . CARDIAC CATHETERIZATION    . EYE SURGERY Left 1990's   "result of trauma"   . FACIAL FRACTURE SURGERY Left 1990's   "result of trauma"   . FRACTURE SURGERY    . HERNIA REPAIR    .  LEFT HEART CATHETERIZATION WITH CORONARY ANGIOGRAM Right 03/07/2013   Procedure: LEFT HEART CATHETERIZATION WITH CORONARY ANGIOGRAM;  Surgeon: [name], MD;  Location: [facility];  Service: Cardiovascular;  Laterality: Right;  . UMBILICAL HERNIA REPAIR         Home Medications    Prior to Admission medications   Medication Sig Start Date End Date Taking? Authorizing Provider  albuterol  (PROVENTIL HFA;VENTOLIN HFA) 108 (90 Base) MCG/ACT inhaler Inhale 2 puffs into the lungs every 6 (six) hours as needed for wheezing or shortness of breath. 07/25/16  Yes Lindell Spar I, NP  dicyclomine (BENTYL) 20 MG tablet Take 1 tablet (20 mg total) by mouth 2 (two) times daily. 06/14/17  Yes Palumbo, April, MD  gabapentin (NEURONTIN) 300 MG capsule Take 1 capsule (300 mg total) by mouth 3 (three) times daily. For agitation Patient taking differently: Take 300 mg by mouth 2 (two) times daily.  07/25/16  Yes Lindell Spar I, NP  hydrALAZINE (APRESOLINE) 25 MG tablet Take 1 tablet (25 mg total) by mouth every 8 (eight) hours. Patient taking differently: Take 25 mg by mouth at bedtime.  12/29/16  Yes Barton Dubois, MD  lipase/protease/amylase (CREON) 12000 units CPEP capsule Take 2 capsules (24,000 Units total) by mouth 3 (three) times daily with meals. For pancreatitis 06/06/17  Yes [name], MD  loratadine (CLARITIN) 10 MG tablet Take 1 tablet (10 mg total) by mouth daily. (May purchase from over the counter): For allergies 07/26/16  Yes Nwoko, Herbert Pun I, NP  Menthol, Topical Analgesic, (BENGAY EX) Apply 1 application topically 3 (three) times daily as needed (joint pain).   Yes [provider]  metoprolol tartrate (LOPRESSOR) 25 MG tablet Take 1 tablet (25 mg total) by mouth 2 (two) times daily. For high blood pressure 12/29/16  Yes Barton Dubois, MD  ondansetron (ZOFRAN) 4 MG tablet Take 1 tablet (4 mg total) by mouth every 6 (six) hours. 06/09/17  Yes [name], MD  oxyCODONE (ROXICODONE) 5 MG immediate release tablet Take 1 tablet (5 mg total) by mouth every 8 (eight) hours as needed. 05/30/17 05/30/18 Yes Georgette Shell, MD  pantoprazole (PROTONIX) 40 MG tablet Take 1 tablet (40 mg total) by mouth daily. 06/09/17  Yes [name], MD  Polyethyl Glycol-Propyl Glycol (SYSTANE OP) Place 1 drop into both eyes daily.   Yes [provider]  sertraline (ZOLOFT) 25 MG tablet Take 3  tablets (75 mg total) by mouth daily. For depression 07/26/16  Yes Nwoko, Herbert Pun I, NP  sucralfate (CARAFATE) 1 g tablet Take 1 tablet (1 g total) by mouth 4 (four) times daily -  with meals and at bedtime. 06/09/17  Yes [name], MD  thiamine 100 MG tablet Take 1 tablet (100 mg total) by mouth daily. 12/30/16  Yes Barton Dubois, MD  omeprazole (PRILOSEC) 20 MG capsule Take 1 capsule (20 mg total) daily by mouth. 03/22/17 06/21/17  Varney Biles, MD    Family History Family History  Problem Relation Age of Onset  . Hypertension Mother   . Cirrhosis Mother   . Alcoholism Mother   . Hypertension Father   . Melanoma Father   . Hypertension Other   . Coronary artery disease Other     Social History Social History   Tobacco Use  . Smoking status: Current Every Day Smoker    Packs/day: 1.00    Types: Cigarettes, E-cigarettes  . Smokeless tobacco: Never Used  Substance Use Topics  . Alcohol use: Yes  Comment: last drink 07/01/17  . Drug use: Yes    Types: Marijuana     Allergies   Robaxin [methocarbamol]; Shellfish-derived products; Trazodone and nefazodone; Contrast media [iodinated diagnostic agents]; and Reglan [metoclopramide]   Review of Systems Review of Systems  Review of Systems  Constitutional: Negative for fever and chills.  HENT: Negative for ear pain, sore throat and trouble swallowing.   Eyes: Negative for pain and visual disturbance.  Respiratory: Negative for cough and shortness of breath.   Cardiovascular: see HPI Gastrointestinal: see HPI Genitourinary: Negative for dysuria, urgency and frequency.  Musculoskeletal: Negative for back pain and joint swelling.  Skin: Negative for rash and wound.  Neurological: Negative for dizziness, syncope, speech difficulty, weakness and numbness.   Physical Exam Updated Vital Signs BP 113/86   Pulse 75   Temp 99 F (37.2 C) (Oral)   Resp 13   Ht 5\' 9"  (1.753 m)   Wt 63.5 kg (140 lb)   SpO2 100%   BMI 20.67  kg/m   Physical Exam  Physical Exam Vitals:   07/03/17 2145 07/03/17 2200  BP: (!) 112/96 113/86  Pulse: 73 75  Resp: 13 13  Temp:    SpO2: 100% 100%   Constitutional: Patient is in no acute distress Head: Normocephalic and atraumatic.  Eyes: Extraocular motion intact, no scleral icterus Neck: Supple without meningismus, mass, or overt JVD Respiratory: Effort normal and breath sounds normal. No respiratory distress. CV: Heart regular rate and rhythm, no obvious murmurs.  Pulses +2 and symmetric Abdomen:LUQ TTP; neg peritoneal signs. MSK: Extremities are atraumatic without deformity, ROM intact; mild hand tremor noted.  Skin: Warm, dry, intact Neuro: Alert and oriented, no motor deficit noted;  Psychiatric: Mood and affect are normal.  ED Treatments / Results  Labs (all labs ordered are listed, but only abnormal results are displayed) Labs Reviewed  CBC WITH DIFFERENTIAL/PLATELET - Abnormal; Notable for the following components:      Result Value   WBC 11.0 (*)    RBC 3.61 (*)    Hemoglobin 10.2 (*)    HCT 31.4 (*)    RDW 16.6 (*)    All other components within normal limits  LIPASE, BLOOD - Abnormal; Notable for the following components:   Lipase 56 (*)    All other components within normal limits  RAPID URINE DRUG SCREEN, HOSP PERFORMED - Abnormal; Notable for the following components:   Opiates POSITIVE (*)    Benzodiazepines POSITIVE (*)    All other components within normal limits  I-STAT CHEM 8, ED - Abnormal; Notable for the following components:   Potassium 3.4 (*)    BUN 5 (*)    Creatinine, Ser 0.50 (*)    Glucose, Bld 116 (*)    Calcium, Ion 1.11 (*)    Hemoglobin 12.6 (*)    HCT 37.0 (*)    All other components within normal limits  ETHANOL  URINALYSIS, ROUTINE W REFLEX MICROSCOPIC  I-STAT TROPONIN, ED  I-STAT TROPONIN, ED  I-STAT TROPONIN, ED    EKG  EKG Interpretation  Date/Time:  Monday July 03 2017 16:58:29 EST Ventricular Rate:   108 PR Interval:    QRS Duration: 74 QT Interval:  285 QTC Calculation: 382 R Axis:   73 Text Interpretation:  Sinus tachycardia Biatrial enlargement Repol abnrm suggests ischemia, diffuse leads since last tracing no significant change Confirmed by Noemi Chapel [phone]) on 07/03/2017 5:15:37 PM      Review of EKG shows sinus tachycardia with  no change from  prior. Radiology No results found.  Procedures Procedures (including critical care time)  Medications Ordered in ED Medications  sodium chloride 0.9 % bolus 1,000 mL (0 mLs Intravenous Stopped 07/03/17 1743)  gi cocktail (Maalox,Lidocaine,Donnatal) (30 mLs Oral Given 07/03/17 1723)  LORazepam (ATIVAN) injection 2 mg (2 mg Intravenous Given 07/03/17 1729)  promethazine (PHENERGAN) injection 25 mg (25 mg Intravenous Given 07/03/17 1726)     Initial Impression / Assessment and Plan / ED Course  I have reviewed the triage vital signs and the nursing notes.  Pertinent labs & imaging results that were available during my care of the patient were reviewed by me and considered in my medical decision making (see chart for details).     47 year old male history of chronic alcohol abuse/pancreatitis, prior history of withdrawal, cocaine abuse, bipolar disorder, Wolff-Parkinson-White pending ablation next month is not on antiarrhythmics presents the emergency department with 2 hours of left upper quadrant abdominal discomfort associate with nausea/vomiting/diarrhea and 1 hour of left-sided chest discomfort similar to his prior WPW/tachycardia.  Patient denies any recent syncopal event, recent illness.  Patient's last drink times 48 hours ago.  Patient denies any fever or shortness of breath.   Review of EKGs shows no findings concerning for acute ACS/WPW arrhythmia.  Patient has mild tachycardia that has interval improvement with 1 L bolus of saline.  Patient is currently hemodynamically stable.  Physical exam as annotated above.  Labs with mild  leukocytosis of 11,000, stable H&H, negative electrolyte imbalance, urine profile not concerning for infection, urine drug screen positive for opiates/positive for benzos, good renal function negative gross electro imbalance.   Trop x 2 neg.  Tachycardia resolved.    history/clinical presentation doubt acute pancreatitis.  Doubt ACS.  Doubt alcohol withdrawal.  Plan for discharge home follow-up primary care provider as needed with good return precautions.  Care plan reviewed for patient.   Final Clinical Impressions(s) / ED Diagnoses   Final diagnoses:  Abdominal pain, unspecified abdominal location  Chest pain, unspecified type    ED Discharge Orders    None       Willette Alma, DO 07/03/17 2351    Noemi Chapel, MD 07/04/17 1410

## 2017-07-03 NOTE — ED Provider Notes (Signed)
I saw and evaluated the patient, reviewed the resident's note and I agree with the findings and plan.  Pertinent History: Pt with chronic pain - has hx of ETOH abuse and chronic pancreatitis - also has hx of WPW - last drink was 36 hours ago - Sunday morning at 3 AM - states last cocaine was 3 months ago - pain in the L mid abd started late last night - this was gradual in onset - worse today - 3 episodes of vomiting today - noted to be tachycardic to 115 by EMS.  Pertinent Exam findings: On my exam the patient's heart rate is approximately 103, he has normal pulses, no edema, clear lung sounds, there is minimal tremor, normal mental status, he is not hallucinating, he does endorse seeing some squiggly lines occasionally, he does not appear anxious.  He is asking for opiate medications and a GI cocktail.  The patient does have a care plan.  At this time I do not think he has a surgical abdomen.  On my exam he has minimal left mid abdominal tenderness but no guarding or peritoneal signs.  He will receive GI cocktail, IV fluids, antiemetics, Ativan  Improved, he no longer has any signs of alcohol withdrawal, his mental status is normal, he is asking for ketamine before he leaves, I told him this was not a safe medication to use outside of the hospital.   EKG Interpretation  Date/Time:  Monday July 03 2017 16:58:29 EST Ventricular Rate:  108 PR Interval:    QRS Duration: 74 QT Interval:  285 QTC Calculation: 382 R Axis:   73 Text Interpretation:  Sinus tachycardia Biatrial enlargement Repol abnrm suggests ischemia, diffuse leads since last tracing no significant change Confirmed by Noemi Chapel [id number]) on 07/03/2017 5:15:37 PM       I personally interpreted the EKG as well as the resident and agree with the interpretation on the resident's chart.  Final diagnoses:  Abdominal pain, unspecified abdominal location  Chest pain, unspecified type      Noemi Chapel, MD 07/04/17 1409

## 2017-07-03 NOTE — ED Triage Notes (Signed)
Pt arrives from home via [facility] c/o abd pain and CP, described as central, "sharp", no radiation.  EMS reports n/v/d today, reports giving:  324 ASA 1 NTG, SL, no relief, 4mg  morphine 4mg  zofran.  Pt reports pain 5/10, AOx4. , resp e/u, NAD noted at this time.

## 2017-07-05 ENCOUNTER — Encounter (HOSPITAL_COMMUNITY): Payer: Self-pay

## 2017-07-05 ENCOUNTER — Emergency Department (HOSPITAL_COMMUNITY)
Admission: EM | Admit: 2017-07-05 | Discharge: 2017-07-05 | Disposition: A | Discharge status: Home / Self Care | Payer: Self-pay | Attending: Emergency Medicine | Admitting: Emergency Medicine

## 2017-07-05 ENCOUNTER — Emergency Department (HOSPITAL_COMMUNITY): Payer: Self-pay

## 2017-07-05 DIAGNOSIS — J45909 Unspecified asthma, uncomplicated: Secondary | ICD-10-CM | POA: Insufficient documentation

## 2017-07-05 DIAGNOSIS — Z79899 Other long term (current) drug therapy: Secondary | ICD-10-CM | POA: Insufficient documentation

## 2017-07-05 DIAGNOSIS — R109 Unspecified abdominal pain: Secondary | ICD-10-CM | POA: Insufficient documentation

## 2017-07-05 DIAGNOSIS — F17210 Nicotine dependence, cigarettes, uncomplicated: Secondary | ICD-10-CM | POA: Insufficient documentation

## 2017-07-05 DIAGNOSIS — G8929 Other chronic pain: Secondary | ICD-10-CM

## 2017-07-05 LAB — URINALYSIS, ROUTINE W REFLEX MICROSCOPIC
BILIRUBIN URINE: NEGATIVE
Glucose, UA: NEGATIVE mg/dL
HGB URINE DIPSTICK: NEGATIVE
Ketones, ur: NEGATIVE mg/dL
Leukocytes, UA: NEGATIVE
NITRITE: NEGATIVE
PH: 5 (ref 5.0–8.0)
Protein, ur: NEGATIVE mg/dL
SPECIFIC GRAVITY, URINE: 1.019 (ref 1.005–1.030)

## 2017-07-05 LAB — CBC
HEMATOCRIT: 29.8 % — AB (ref 39.0–52.0)
Hemoglobin: 9.6 g/dL — ABNORMAL LOW (ref 13.0–17.0)
MCH: 27.6 pg (ref 26.0–34.0)
MCHC: 32.2 g/dL (ref 30.0–36.0)
MCV: 85.6 fL (ref 78.0–100.0)
Platelets: 303 10*3/uL (ref 150–400)
RBC: 3.48 MIL/uL — ABNORMAL LOW (ref 4.22–5.81)
RDW: 16.3 % — ABNORMAL HIGH (ref 11.5–15.5)
WBC: 10.5 10*3/uL (ref 4.0–10.5)

## 2017-07-05 LAB — COMPREHENSIVE METABOLIC PANEL
ALT: 48 U/L (ref 17–63)
AST: 84 U/L — AB (ref 15–41)
Albumin: 3.7 g/dL (ref 3.5–5.0)
Alkaline Phosphatase: 91 U/L (ref 38–126)
Anion gap: 12 (ref 5–15)
BUN: 7 mg/dL (ref 6–20)
CHLORIDE: 103 mmol/L (ref 101–111)
CO2: 24 mmol/L (ref 22–32)
CREATININE: 0.56 mg/dL — AB (ref 0.61–1.24)
Calcium: 9.1 mg/dL (ref 8.9–10.3)
GFR calc Af Amer: >60 mL/min (ref >60)
GFR calc non Af Amer: >60 mL/min (ref >60)
GLUCOSE: 86 mg/dL (ref 65–99)
Potassium: 3.2 mmol/L — ABNORMAL LOW (ref 3.5–5.1)
Sodium: 139 mmol/L (ref 135–145)
Total Bilirubin: 0.4 mg/dL (ref 0.3–1.2)
Total Protein: 7 g/dL (ref 6.5–8.1)

## 2017-07-05 LAB — I-STAT TROPONIN, ED: Troponin i, poc: 0 ng/mL (ref 0.00–0.08)

## 2017-07-05 LAB — LIPASE, BLOOD: Lipase: 115 U/L — ABNORMAL HIGH (ref 11–51)

## 2017-07-05 MED ORDER — GI COCKTAIL ~~LOC~~
30.0000 mL | Freq: Once | ORAL | Status: AC
  Administered 2017-07-05: 30 mL via ORAL
  Filled 2017-07-05: qty 30

## 2017-07-05 MED ORDER — LORAZEPAM 1 MG PO TABS: 0.0000 mg | ORAL_TABLET | Freq: Two times a day (BID) | ORAL | Status: DC

## 2017-07-05 MED ORDER — KETOROLAC TROMETHAMINE 60 MG/2ML IM SOLN
30.0000 mg | Freq: Once | INTRAMUSCULAR | Status: AC
  Administered 2017-07-05: 30 mg via INTRAMUSCULAR
  Filled 2017-07-05: qty 2

## 2017-07-05 MED ORDER — ACETAMINOPHEN 325 MG PO TABS
650.0000 mg | ORAL_TABLET | Freq: Once | ORAL | Status: AC
  Administered 2017-07-05: 650 mg via ORAL
  Filled 2017-07-05: qty 2

## 2017-07-05 MED ORDER — ONDANSETRON 4 MG PO TBDP
4.0000 mg | ORAL_TABLET | Freq: Once | ORAL | Status: AC
  Administered 2017-07-05: 4 mg via ORAL
  Filled 2017-07-05: qty 1

## 2017-07-05 MED ORDER — LORAZEPAM 2 MG/ML IJ SOLN: 0.0000 mg | Freq: Four times a day (QID) | INTRAMUSCULAR | Status: DC

## 2017-07-05 MED ORDER — FAMOTIDINE 20 MG PO TABS
20.0000 mg | ORAL_TABLET | Freq: Two times a day (BID) | ORAL | 0 refills | Status: DC
Start: 2017-07-05 — End: 2017-08-29

## 2017-07-05 MED ORDER — LORAZEPAM 2 MG/ML IJ SOLN: 0.0000 mg | Freq: Two times a day (BID) | INTRAMUSCULAR | Status: DC

## 2017-07-05 MED ORDER — FAMOTIDINE 20 MG PO TABS
20.0000 mg | ORAL_TABLET | Freq: Once | ORAL | Status: AC
  Administered 2017-07-05: 20 mg via ORAL
  Filled 2017-07-05: qty 1

## 2017-07-05 MED ORDER — THIAMINE HCL 100 MG/ML IJ SOLN: 100.0000 mg | Freq: Every day | INTRAMUSCULAR | Status: DC

## 2017-07-05 MED ORDER — LORAZEPAM 1 MG PO TABS: 0.0000 mg | ORAL_TABLET | Freq: Four times a day (QID) | ORAL | Status: DC

## 2017-07-05 MED ORDER — POTASSIUM CHLORIDE CRYS ER 20 MEQ PO TBCR
40.0000 meq | EXTENDED_RELEASE_TABLET | Freq: Once | ORAL | Status: AC
  Administered 2017-07-05: 40 meq via ORAL
  Filled 2017-07-05: qty 2

## 2017-07-05 MED ORDER — VITAMIN B-1 100 MG PO TABS
100.0000 mg | ORAL_TABLET | Freq: Every day | ORAL | Status: DC
  Administered 2017-07-05: 100 mg via ORAL
  Filled 2017-07-05: qty 1

## 2017-07-05 NOTE — ED Triage Notes (Signed)
Pt arrived via [facility] complaining of abdominal pain and pain in center of chest, states it "feels like someone is poking"

## 2017-07-05 NOTE — ED Notes (Signed)
Results reviewed.  No change to acuity at this time

## 2017-07-05 NOTE — Care Management (Signed)
Patient has had 23  ED visit in the past 6 months patient was in the ED last night with similar complaints.  Patient is being followed by Marliss Coots  NP at the [facility] where he also receives medication assistance. When medically cleared patient should folllow up at the [facility] and see [name] [facility] NP Monday thru Friday 8- 11am.

## 2017-07-05 NOTE — ED Provider Notes (Signed)
[facility] EMERGENCY DEPARTMENT Provider Note   CSN: [id number] Arrival date & time: 07/05/17  1734     History   Chief Complaint Chief Complaint  Patient presents with  . Chest Pain  . Abdominal Pain    HPI [name] is a 47 y.o. male.  HPI  47 year old male with a history of chronic alcohol abuse, cocaine abuse, and chronic abdominal pain/pancreatitis presents with left upper abdominal pain.  He states this is been ongoing for the last couple days when he was seen in the ED.  Got better and now is getting worse.  It is similar to prior pancreatitis.  He has had nausea, vomiting, and blood in his vomit or stool and no melena.  He states he last drank a 12 ounce beer today.  He states he went to cut off shakes he was feeling and to help his abdominal pain settled down.  He also has been feeling some left sharp stabbing chest pain that he thinks is reminiscent of his WPW.  He denies any dizziness or lightheadedness currently.  No shortness of breath.  He states that he knows he has a care plan and is not supposed to get narcotics and is asking for ketamine.  Past Medical History:  Diagnosis Date  . Alcoholism /alcohol abuse ([place])   . Anemia   . Anxiety   . Arthritis    "knees; arms; elbows" (03/26/2015)  . Asthma   . Bipolar disorder ([place])   . Chronic bronchitis ([place])   . Chronic lower back pain   . Cocaine abuse ([place])   . Depression   . Family history of adverse reaction to anesthesia    "grandmother gets confused"  . Femoral condyle fracture ([place]) 03/08/2014   left medial/notes 03/09/2014  . GERD (gastroesophageal reflux disease)   . H/O hiatal hernia   . H/O suicide attempt 10/2012  . Heart murmur    "when he was little" (03/06/2013)  . High cholesterol   . History of blood transfusion 10/2012   "when I tried to commit suicide"  . History of stomach ulcers   . Hypertension   . Marijuana abuse, continuous   . Migraine    "a few  times/year" (03/26/2015)  . Pancreatitis   . Pneumonia 1990's X 3  . PTSD (post-traumatic stress disorder)   . Shortness of breath    "can happen at anytime" (03/06/2013)  . Sickle cell trait ([place])   . WPW (Wolff-Parkinson-White syndrome)    [name] 03/06/2013    Patient Active Problem List   Diagnosis Date Noted  . Abdominal pain 05/27/2017  . Hematemesis 05/27/2017  . Tachycardia 03/18/2017  . Nausea & vomiting 03/18/2017  . Diarrhea 03/18/2017  . Acute on chronic pancreatitis ([place]) 12/17/2016  . Chronic alcoholic pancreatitis ([place]) 12/05/2016  . Intractable nausea and vomiting 12/05/2016  . Verbally abusive behavior 12/05/2016  . Normocytic anemia 12/05/2016  . Alcohol use disorder, severe, dependence ([place]) 07/25/2016  . Cocaine use disorder, severe, dependence ([place]) 07/25/2016  . 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 Leukocytosis   . Hospital acquired PNA 05/20/2015  . Pancreatitis 05/18/2015  . Pseudocyst of pancreas 05/18/2015  . Polysubstance abuse (tobacco, cocaine, THC, and ETOH) 03/26/2015  . Alcohol-induced chronic pancreatitis ([place])   . Benign essential HTN 02/06/2014  . Alcohol-induced acute pancreatitis 11/28/2013  . Pancreatic pseudocyst/cyst 11/25/2013  . MDD (major depressive disorder), recurrent severe, without psychosis ([place]) 10/22/2013  . Severe  protein-calorie malnutrition ([place]) 10/10/2013  . Suicide attempt ([place]) 10/08/2013  . 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 TOBACCO ABUSE 03/23/2007    Past Surgical History:  Procedure Laterality Date  . CARDIAC CATHETERIZATION    . EYE SURGERY Left 1990's   "result of trauma"   . FACIAL FRACTURE SURGERY Left 1990's   "result of trauma"   . FRACTURE SURGERY    . HERNIA REPAIR    . LEFT HEART CATHETERIZATION WITH CORONARY ANGIOGRAM Right 03/07/2013   Procedure: LEFT HEART CATHETERIZATION WITH CORONARY ANGIOGRAM;  Surgeon:  [name], MD;  Location: [facility] CATH LAB;  Service: Cardiovascular;  Laterality: Right;  . UMBILICAL HERNIA REPAIR         Home Medications    Prior to Admission medications   Medication Sig Start Date End Date Taking? Authorizing Provider  albuterol (PROVENTIL HFA;VENTOLIN HFA) 108 (90 Base) MCG/ACT inhaler Inhale 2 puffs into the lungs every 6 (six) hours as needed for wheezing or shortness of breath. 07/25/16   Lindell Spar I, NP  dicyclomine (BENTYL) 20 MG tablet Take 1 tablet (20 mg total) by mouth 2 (two) times daily. 06/14/17   Palumbo, April, MD  famotidine (PEPCID) 20 MG tablet Take 1 tablet (20 mg total) by mouth 2 (two) times daily. 07/05/17   Sherwood Gambler, MD  gabapentin (NEURONTIN) 300 MG capsule Take 1 capsule (300 mg total) by mouth 3 (three) times daily. For agitation Patient taking differently: Take 300 mg by mouth 2 (two) times daily.  07/25/16   Lindell Spar I, NP  hydrALAZINE (APRESOLINE) 25 MG tablet Take 1 tablet (25 mg total) by mouth every 8 (eight) hours. Patient taking differently: Take 25 mg by mouth at bedtime.  12/29/16   Barton Dubois, MD  lipase/protease/amylase (CREON) 12000 units CPEP capsule Take 2 capsules (24,000 Units total) by mouth 3 (three) times daily with meals. For pancreatitis 06/06/17   [name], MD  loratadine (CLARITIN) 10 MG tablet Take 1 tablet (10 mg total) by mouth daily. (May purchase from over the counter): For allergies 07/26/16   Lindell Spar I, NP  Menthol, Topical Analgesic, (BENGAY EX) Apply 1 application topically 3 (three) times daily as needed (joint pain).    [provider]  metoprolol tartrate (LOPRESSOR) 25 MG tablet Take 1 tablet (25 mg total) by mouth 2 (two) times daily. For high blood pressure 12/29/16   Barton Dubois, MD  ondansetron (ZOFRAN) 4 MG tablet Take 1 tablet (4 mg total) by mouth every 6 (six) hours. 06/09/17   [name], MD  oxyCODONE (ROXICODONE) 5 MG immediate release tablet Take 1 tablet (5 mg  total) by mouth every 8 (eight) hours as needed. 05/30/17 05/30/18  Georgette Shell, MD  pantoprazole (PROTONIX) 40 MG tablet Take 1 tablet (40 mg total) by mouth daily. 06/09/17   [name], MD  Polyethyl Glycol-Propyl Glycol (SYSTANE OP) Place 1 drop into both eyes daily.    [provider]  sertraline (ZOLOFT) 25 MG tablet Take 3 tablets (75 mg total) by mouth daily. For depression 07/26/16   Lindell Spar I, NP  sucralfate (CARAFATE) 1 g tablet Take 1 tablet (1 g total) by mouth 4 (four) times daily -  with meals and at bedtime. 06/09/17   [name], MD  thiamine 100 MG tablet Take 1 tablet (100 mg total) by mouth daily. 12/30/16   Barton Dubois, MD  omeprazole (PRILOSEC) 20 MG capsule Take 1 capsule (20 mg total) daily  by mouth. 03/22/17 06/21/17  Varney Biles, MD    Family History Family History  Problem Relation Age of Onset  . Hypertension Mother   . Cirrhosis Mother   . Alcoholism Mother   . Hypertension Father   . Melanoma Father   . Hypertension Other   . Coronary artery disease Other     Social History Social History   Tobacco Use  . Smoking status: Current Every Day Smoker    Packs/day: 1.00    Types: Cigarettes, E-cigarettes  . Smokeless tobacco: Never Used  Substance Use Topics  . Alcohol use: Yes    Comment: last drink 07/01/17  . Drug use: Yes    Types: Marijuana     Allergies   Robaxin [methocarbamol]; Shellfish-derived products; Trazodone and nefazodone; Contrast media [iodinated diagnostic agents]; and Reglan [metoclopramide]   Review of Systems Review of Systems  Constitutional: Negative for fever.  Respiratory: Negative for shortness of breath.   Cardiovascular: Positive for chest pain.  Gastrointestinal: Positive for abdominal pain, diarrhea, nausea and vomiting. Negative for blood in stool.  All other systems reviewed and are negative.    Physical Exam Updated Vital Signs BP 126/84   Pulse (!) 104   Temp 98.1 F (36.7 C)  (Oral)   Resp 18   Ht 5\' 9"  (1.753 m)   Wt 63.5 kg (140 lb)   SpO2 100%   BMI 20.67 kg/m   Physical Exam  Constitutional: He is oriented to person, place, and time. He appears well-developed and well-nourished. No distress.  HENT:  Head: Normocephalic and atraumatic.  Right Ear: External ear normal.  Left Ear: External ear normal.  Nose: Nose normal.  Eyes: Right eye exhibits no discharge. Left eye exhibits no discharge.  Neck: Neck supple.  Cardiovascular: Regular rhythm and normal heart sounds. Tachycardia present.  HR low 100s  Pulmonary/Chest: Effort normal and breath sounds normal. He exhibits no tenderness.  Abdominal: Soft. There is tenderness (mild) in the left upper quadrant.  Musculoskeletal: He exhibits no edema.  Neurological: He is alert and oriented to person, place, and time.  Skin: Skin is warm and dry. He is not diaphoretic.  Nursing note and vitals reviewed.    ED Treatments / Results  Labs (all labs ordered are listed, but only abnormal results are displayed) Labs Reviewed  CBC - Abnormal; Notable for the following components:      Result Value   RBC 3.48 (*)    Hemoglobin 9.6 (*)    HCT 29.8 (*)    RDW 16.3 (*)    All other components within normal limits  LIPASE, BLOOD - Abnormal; Notable for the following components:   Lipase 115 (*)    All other components within normal limits  COMPREHENSIVE METABOLIC PANEL - Abnormal; Notable for the following components:   Potassium 3.2 (*)    Creatinine, Ser 0.56 (*)    AST 84 (*)    All other components within normal limits  URINALYSIS, ROUTINE W REFLEX MICROSCOPIC  I-STAT TROPONIN, ED    EKG  EKG Interpretation  Date/Time:  Wednesday July 05 2017 17:44:47 EST Ventricular Rate:  106 PR Interval:  130 QRS Duration: 82 QT Interval:  316 QTC Calculation: 419 R Axis:   69 Text Interpretation:  Sinus tachycardia Biatrial enlargement T wave abnormality, consider inferior ischemia T wave  abnormality, consider anterior ischemia Abnormal ECG No significant change since last tracing Confirmed by Deno Etienne [phone]) on 07/05/2017 5:54:52 PM  Radiology Dg Chest 2 View  Result Date: 07/05/2017 CLINICAL DATA:  Chest pain EXAM: CHEST  2 VIEW COMPARISON:  03/05/2017 FINDINGS: The heart size and mediastinal contours are within normal limits. Both lungs are clear. The visualized skeletal structures are unremarkable. IMPRESSION: No active cardiopulmonary disease. Electronically Signed   By: Donavan Foil M.D.   On: 07/05/2017 18:54    Procedures Procedures (including critical care time)  Medications Ordered in ED Medications  LORazepam (ATIVAN) injection 0-4 mg (0 mg Intravenous Not Given 07/05/17 2054)    Or  LORazepam (ATIVAN) tablet 0-4 mg ( Oral See Alternative 07/05/17 2054)  LORazepam (ATIVAN) injection 0-4 mg (not administered)    Or  LORazepam (ATIVAN) tablet 0-4 mg (not administered)  thiamine (VITAMIN B-1) tablet 100 mg (100 mg Oral Given 07/05/17 2200)    Or  thiamine (B-1) injection 100 mg ( Intravenous See Alternative 07/05/17 2200)  ondansetron (ZOFRAN-ODT) disintegrating tablet 4 mg (4 mg Oral Given 07/05/17 2049)  potassium chloride SA (K-DUR,KLOR-CON) CR tablet 40 mEq (40 mEq Oral Given 07/05/17 2049)  gi cocktail (Maalox,Lidocaine,Donnatal) (30 mLs Oral Given 07/05/17 2049)  famotidine (PEPCID) tablet 20 mg (20 mg Oral Given 07/05/17 2200)  acetaminophen (TYLENOL) tablet 650 mg (650 mg Oral Given 07/05/17 2200)  ketorolac (TORADOL) injection 30 mg (30 mg Intramuscular Given 07/05/17 2156)     Initial Impression / Assessment and Plan / ED Course  I have reviewed the triage vital signs and the nursing notes.  Pertinent labs & imaging results that were available during my care of the patient were reviewed by me and considered in my medical decision making (see chart for details).     Patient's presentation is c/w chronic pain exacerbation. Labwork unremarkable  save for mild hypokalemia. He was encouraged to cut back on ETOH use. He states he's tremulous but does not appear to be in withdrawal, and CIWA is 2. He is repetitively asking for ketamine and various different narcotics, but I don't think this is warranted in this otherwise unremarkable chronic pain exacerbation. While he has a history of WPW does not appear to have an arrhythmia and has atypical chest pain. PCP f/u, return precautions. Tolerating PO without difficulty, no vomiting.   Final Clinical Impressions(s) / ED Diagnoses   Final diagnoses:  Chronic abdominal pain    ED Discharge Orders        Ordered    famotidine (PEPCID) 20 MG tablet  2 times daily     07/05/17 2226       Sherwood Gambler, MD 07/06/17 [phone]

## 2017-07-05 NOTE — ED Notes (Addendum)
Pt given d/c paperwork and shown information for f/u appt instructions as well as location within paperwork for assistance with locating a PCM. Pt ambulated to lobby without assistance.

## 2017-07-06 ENCOUNTER — Encounter (HOSPITAL_COMMUNITY): Payer: Self-pay | Admitting: Emergency Medicine

## 2017-07-06 ENCOUNTER — Emergency Department (HOSPITAL_COMMUNITY)
Admission: EM | Admit: 2017-07-06 | Discharge: 2017-07-06 | Disposition: A | Discharge status: Home / Self Care | Payer: Self-pay | Attending: Emergency Medicine | Admitting: Emergency Medicine

## 2017-07-06 DIAGNOSIS — I10 Essential (primary) hypertension: Secondary | ICD-10-CM | POA: Insufficient documentation

## 2017-07-06 DIAGNOSIS — R1084 Generalized abdominal pain: Secondary | ICD-10-CM | POA: Insufficient documentation

## 2017-07-06 DIAGNOSIS — J45909 Unspecified asthma, uncomplicated: Secondary | ICD-10-CM | POA: Insufficient documentation

## 2017-07-06 DIAGNOSIS — R112 Nausea with vomiting, unspecified: Secondary | ICD-10-CM | POA: Insufficient documentation

## 2017-07-06 DIAGNOSIS — F17210 Nicotine dependence, cigarettes, uncomplicated: Secondary | ICD-10-CM | POA: Insufficient documentation

## 2017-07-06 DIAGNOSIS — Z79899 Other long term (current) drug therapy: Secondary | ICD-10-CM | POA: Insufficient documentation

## 2017-07-06 LAB — COMPREHENSIVE METABOLIC PANEL
ALK PHOS: 81 U/L (ref 38–126)
ALT: 48 U/L (ref 17–63)
AST: 66 U/L — ABNORMAL HIGH (ref 15–41)
Albumin: 3.6 g/dL (ref 3.5–5.0)
Anion gap: 11 (ref 5–15)
BILIRUBIN TOTAL: 0.4 mg/dL (ref 0.3–1.2)
BUN: 7 mg/dL (ref 6–20)
CALCIUM: 8.7 mg/dL — AB (ref 8.9–10.3)
CO2: 23 mmol/L (ref 22–32)
CREATININE: 0.54 mg/dL — AB (ref 0.61–1.24)
Chloride: 104 mmol/L (ref 101–111)
Glucose, Bld: 124 mg/dL — ABNORMAL HIGH (ref 65–99)
Potassium: 3.5 mmol/L (ref 3.5–5.1)
Sodium: 138 mmol/L (ref 135–145)
TOTAL PROTEIN: 6.7 g/dL (ref 6.5–8.1)

## 2017-07-06 LAB — CBC
HCT: 28.6 % — ABNORMAL LOW (ref 39.0–52.0)
Hemoglobin: 9.4 g/dL — ABNORMAL LOW (ref 13.0–17.0)
MCH: 28.4 pg (ref 26.0–34.0)
MCHC: 32.9 g/dL (ref 30.0–36.0)
MCV: 86.4 fL (ref 78.0–100.0)
PLATELETS: 258 10*3/uL (ref 150–400)
RBC: 3.31 MIL/uL — AB (ref 4.22–5.81)
RDW: 16.6 % — ABNORMAL HIGH (ref 11.5–15.5)
WBC: 9.2 10*3/uL (ref 4.0–10.5)

## 2017-07-06 LAB — LIPASE, BLOOD: Lipase: 79 U/L — ABNORMAL HIGH (ref 11–51)

## 2017-07-06 MED ORDER — KETOROLAC TROMETHAMINE 30 MG/ML IJ SOLN
30.0000 mg | Freq: Once | INTRAMUSCULAR | Status: AC
Start: 2017-07-06 — End: 2017-07-06
  Administered 2017-07-06: 30 mg via INTRAMUSCULAR
  Filled 2017-07-06: qty 1

## 2017-07-06 MED ORDER — ONDANSETRON HCL 4 MG/2ML IJ SOLN: 4.0000 mg | Freq: Once | INTRAMUSCULAR | Status: DC

## 2017-07-06 MED ORDER — ONDANSETRON HCL 4 MG PO TABS: 4.0000 mg | ORAL_TABLET | Freq: Four times a day (QID) | ORAL | 0 refills | Status: DC

## 2017-07-06 MED ORDER — ONDANSETRON 4 MG PO TBDP
4.0000 mg | ORAL_TABLET | Freq: Once | ORAL | Status: AC
  Administered 2017-07-06: 4 mg via ORAL
  Filled 2017-07-06: qty 1

## 2017-07-06 MED ORDER — ACETAMINOPHEN 325 MG PO TABS: 650.0000 mg | ORAL_TABLET | Freq: Once | ORAL | Status: DC

## 2017-07-06 MED ORDER — GI COCKTAIL ~~LOC~~
30.0000 mL | Freq: Once | ORAL | Status: AC
  Administered 2017-07-06: 30 mL via ORAL
  Filled 2017-07-06: qty 30

## 2017-07-06 NOTE — ED Provider Notes (Signed)
[facility] EMERGENCY DEPARTMENT Provider Note   CSN: [id number] Arrival date & time: 07/06/17  1525   History   Chief Complaint Chief Complaint  Patient presents with  . Abdominal Pain    HPI [name] is a 47 y.o. male.  HPI   48 year old male with a history of chronic alcohol abuse, cocaine abuse, chronic abdominal pain/pancreatitis presents today with complaints of abdominal pain nausea vomiting.  Patient notes several day history of the same.  Patient notes symptoms are not improving at home.  He has been taking medication that was given to him by his mom he does not know what this medication is.  Patient reports that he does not have any Zofran at home and had not had his prescriptions refilled recently.  Patient reports that he has not had a drink in 48 hours.  Patient denies any fever.  Patient is requesting ketamine.    Past Medical History:  Diagnosis Date  . Alcoholism /alcohol abuse ([place])   . Anemia   . Anxiety   . Arthritis    "knees; arms; elbows" (03/26/2015)  . Asthma   . Bipolar disorder ([place])   . Chronic bronchitis ([place])   . Chronic lower back pain   . Cocaine abuse ([place])   . Depression   . Family history of adverse reaction to anesthesia    "grandmother gets confused"  . Femoral condyle fracture ([place]) 03/08/2014   left medial/notes 03/09/2014  . GERD (gastroesophageal reflux disease)   . H/O hiatal hernia   . H/O suicide attempt 10/2012  . Heart murmur    "when he was little" (03/06/2013)  . High cholesterol   . History of blood transfusion 10/2012   "when I tried to commit suicide"  . History of stomach ulcers   . Hypertension   . Marijuana abuse, continuous   . Migraine    "a few times/year" (03/26/2015)  . Pancreatitis   . Pneumonia 1990's X 3  . PTSD (post-traumatic stress disorder)   . Shortness of breath    "can happen at anytime" (03/06/2013)  . Sickle cell trait ([place])   . WPW (Wolff-Parkinson-White  syndrome)    [name] 03/06/2013    Patient Active Problem List   Diagnosis Date Noted  . Abdominal pain 05/27/2017  . Hematemesis 05/27/2017  . Tachycardia 03/18/2017  . Nausea & vomiting 03/18/2017  . Diarrhea 03/18/2017  . Acute on chronic pancreatitis ([place]) 12/17/2016  . Chronic alcoholic pancreatitis ([place]) 12/05/2016  . Intractable nausea and vomiting 12/05/2016  . Verbally abusive behavior 12/05/2016  . Normocytic anemia 12/05/2016  . Alcohol use disorder, severe, dependence ([place]) 07/25/2016  . Cocaine use disorder, severe, dependence ([place]) 07/25/2016  . 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 Leukocytosis   . Hospital acquired PNA 05/20/2015  . Pancreatitis 05/18/2015  . Pseudocyst of pancreas 05/18/2015  . Polysubstance abuse (tobacco, cocaine, THC, and ETOH) 03/26/2015  . Alcohol-induced chronic pancreatitis ([place])   . Benign essential HTN 02/06/2014  . Alcohol-induced acute pancreatitis 11/28/2013  . Pancreatic pseudocyst/cyst 11/25/2013  . MDD (major depressive disorder), recurrent severe, without psychosis ([place]) 10/22/2013  . Severe protein-calorie malnutrition ([place]) 10/10/2013  . Suicide attempt ([place]) 10/08/2013  . 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 TOBACCO ABUSE 03/23/2007    Past Surgical History:  Procedure Laterality Date  . CARDIAC CATHETERIZATION    . EYE SURGERY Left 1990's   "result of trauma"   .  FACIAL FRACTURE SURGERY Left 1990's   "result of trauma"   . FRACTURE SURGERY    . HERNIA REPAIR    . LEFT HEART CATHETERIZATION WITH CORONARY ANGIOGRAM Right 03/07/2013   Procedure: LEFT HEART CATHETERIZATION WITH CORONARY ANGIOGRAM;  Surgeon: [name], MD;  Location: [facility] CATH [facility];  Service: Cardiovascular;  Laterality: Right;  . UMBILICAL HERNIA REPAIR         Home Medications    Prior to Admission medications   Medication Sig Start Date End Date Taking?  Authorizing Provider  albuterol (PROVENTIL HFA;VENTOLIN HFA) 108 (90 Base) MCG/ACT inhaler Inhale 2 puffs into the lungs every 6 (six) hours as needed for wheezing or shortness of breath. 07/25/16   Lindell Spar I, NP  dicyclomine (BENTYL) 20 MG tablet Take 1 tablet (20 mg total) by mouth 2 (two) times daily. 06/14/17   Palumbo, April, MD  famotidine (PEPCID) 20 MG tablet Take 1 tablet (20 mg total) by mouth 2 (two) times daily. 07/05/17   Sherwood Gambler, MD  gabapentin (NEURONTIN) 300 MG capsule Take 1 capsule (300 mg total) by mouth 3 (three) times daily. For agitation Patient taking differently: Take 300 mg by mouth 2 (two) times daily.  07/25/16   Lindell Spar I, NP  hydrALAZINE (APRESOLINE) 25 MG tablet Take 1 tablet (25 mg total) by mouth every 8 (eight) hours. Patient taking differently: Take 25 mg by mouth at bedtime.  12/29/16   Barton Dubois, MD  lipase/protease/amylase (CREON) 12000 units CPEP capsule Take 2 capsules (24,000 Units total) by mouth 3 (three) times daily with meals. For pancreatitis 06/06/17   [name], MD  loratadine (CLARITIN) 10 MG tablet Take 1 tablet (10 mg total) by mouth daily. (May purchase from over the counter): For allergies 07/26/16   Lindell Spar I, NP  Menthol, Topical Analgesic, (BENGAY EX) Apply 1 application topically 3 (three) times daily as needed (joint pain).    [provider]  metoprolol tartrate (LOPRESSOR) 25 MG tablet Take 1 tablet (25 mg total) by mouth 2 (two) times daily. For high blood pressure 12/29/16   Barton Dubois, MD  ondansetron (ZOFRAN) 4 MG tablet Take 1 tablet (4 mg total) by mouth every 6 (six) hours. 07/06/17   Kenyetta Wimbish, Dellis Filbert, PA-C  oxyCODONE (ROXICODONE) 5 MG immediate release tablet Take 1 tablet (5 mg total) by mouth every 8 (eight) hours as needed. 05/30/17 05/30/18  Georgette Shell, MD  pantoprazole (PROTONIX) 40 MG tablet Take 1 tablet (40 mg total) by mouth daily. 06/09/17   [name], MD  Polyethyl Glycol-Propyl  Glycol (SYSTANE OP) Place 1 drop into both eyes daily.    [provider]  sertraline (ZOLOFT) 25 MG tablet Take 3 tablets (75 mg total) by mouth daily. For depression 07/26/16   Lindell Spar I, NP  sucralfate (CARAFATE) 1 g tablet Take 1 tablet (1 g total) by mouth 4 (four) times daily -  with meals and at bedtime. 06/09/17   [name], MD  thiamine 100 MG tablet Take 1 tablet (100 mg total) by mouth daily. 12/30/16   Barton Dubois, MD  omeprazole (PRILOSEC) 20 MG capsule Take 1 capsule (20 mg total) daily by mouth. 03/22/17 06/21/17  Varney Biles, MD    Family History Family History  Problem Relation Age of Onset  . Hypertension Mother   . Cirrhosis Mother   . Alcoholism Mother   . Hypertension Father   . Melanoma Father   . Hypertension Other   . Coronary artery  disease Other     Social History Social History   Tobacco Use  . Smoking status: Current Every Day Smoker    Packs/day: 1.00    Types: Cigarettes, E-cigarettes  . Smokeless tobacco: Never Used  Substance Use Topics  . Alcohol use: Yes    Comment: last drink 07/01/17  . Drug use: Yes    Types: Marijuana     Allergies   Robaxin [methocarbamol]; Shellfish-derived products; Trazodone and nefazodone; Contrast media [iodinated diagnostic agents]; and Reglan [metoclopramide]   Review of Systems Review of Systems  All other systems reviewed and are negative.    Physical Exam Updated Vital Signs BP 129/81 (BP Location: Right Arm)   Pulse 100   Temp 99 F (37.2 C) (Oral)   Resp 17   SpO2 99%   Physical Exam  Constitutional: He is oriented to person, place, and time. He appears well-developed and well-nourished.  HENT:  Head: Normocephalic and atraumatic.  Eyes: Conjunctivae are normal. Pupils are equal, round, and reactive to light. Right eye exhibits no discharge. Left eye exhibits no discharge. No scleral icterus.  Neck: Normal range of motion. No JVD present. No tracheal deviation present.    Pulmonary/Chest: Effort normal. No stridor.  Abdominal:  Diffusely tender to palpation of the abdomen, nonfocal  Neurological: He is alert and oriented to person, place, and time. Coordination normal.  No tremors noted  Psychiatric: He has a normal mood and affect. His behavior is normal. Judgment and thought content normal.  Nursing note and vitals reviewed.    ED Treatments / Results  Labs (all labs ordered are listed, but only abnormal results are displayed) Labs Reviewed  LIPASE, BLOOD - Abnormal; Notable for the following components:      Result Value   Lipase 79 (*)    All other components within normal limits  COMPREHENSIVE METABOLIC PANEL - Abnormal; Notable for the following components:   Glucose, Bld 124 (*)    Creatinine, Ser 0.54 (*)    Calcium 8.7 (*)    AST 66 (*)    All other components within normal limits  CBC - Abnormal; Notable for the following components:   RBC 3.31 (*)    Hemoglobin 9.4 (*)    HCT 28.6 (*)    RDW 16.6 (*)    All other components within normal limits  URINALYSIS, ROUTINE W REFLEX MICROSCOPIC    EKG  EKG Interpretation None       Radiology Dg Chest 2 View  Result Date: 07/05/2017 CLINICAL DATA:  Chest pain EXAM: CHEST  2 VIEW COMPARISON:  03/05/2017 FINDINGS: The heart size and mediastinal contours are within normal limits. Both lungs are clear. The visualized skeletal structures are unremarkable. IMPRESSION: No active cardiopulmonary disease. Electronically Signed   By: Donavan Foil M.D.   On: 07/05/2017 18:54    Procedures Procedures (including critical care time)  Medications Ordered in ED Medications  gi cocktail (Maalox,Lidocaine,Donnatal) (30 mLs Oral Given 07/06/17 1924)  ondansetron (ZOFRAN-ODT) disintegrating tablet 4 mg (4 mg Oral Given 07/06/17 1924)  ketorolac (TORADOL) 30 MG/ML injection 30 mg (30 mg Intramuscular Given 07/06/17 1924)     Initial Impression / Assessment and Plan / ED Course  I have reviewed  the triage vital signs and the nursing notes.  Pertinent labs & imaging results that were available during my care of the patient were reviewed by me and considered in my medical decision making (see chart for details).      Final Clinical Impressions(s) /  ED Diagnoses   Final diagnoses:  Generalized abdominal pain    Labs: Lipase 79, CMP CBC  Imaging:  Consults:  Therapeutics: Toradol, GI cocktail, Zofran  Discharge Meds: Zofran  Assessment/Plan: 47 year old male presents today with chronic abdominal pain.  This is patient's 24th visit in the last 6 months for similar presentation.  He is very well-appearing in no acute distress.  He is afebrile with no signs of tachycardia.  Patient's laboratory analysis is reassuring.  Does have mild elevation in his lipase which is improved over previous visits.  He has no significant electrolyte derangement, no elevation in white blood cells.  Today's presentation appears to be typical of previous with no concerning signs or symptoms.  Nursing staff originally concern for alcohol withdrawal.  I evaluated the patient noted no signs of alcohol withdrawal, he patient's body movements were smooth with no tremors, no perspiration no concern today for alcohol withdrawal.  Patient does not appear to be intoxicated either.  Patient upset that he would not be receiving anything other than the above medications.  I had a lengthy discussion with him that narcotics would not be appropriate, ketamine was requested multiple times, I do not find this appropriate at this time as well.  Patient discharged with return precautions.    ED Discharge Orders        Ordered    ondansetron (ZOFRAN) 4 MG tablet  Every 6 hours     07/06/17 1852       Okey Regal, Hershal Coria 07/06/17 1947    Carmin Muskrat, MD 07/08/17 [phone]

## 2017-07-06 NOTE — ED Notes (Signed)
Pt reports he has hx of pancreatitis and thinks he is having a flare up. Pt states that he is normally a daily drinker of at least a 6 pack of beer and "smokes a blunt" daily. Pt reports last drink was Tuesday night.

## 2017-07-06 NOTE — ED Notes (Signed)
ED Provider at bedside. 

## 2017-07-06 NOTE — Discharge Instructions (Signed)
Please read attached information. If you experience any new or worsening signs or symptoms please return to the emergency room for evaluation. Please follow-up with your primary care provider or specialist as discussed. Please use medication prescribed only as directed and discontinue taking if you have any concerning signs or symptoms.   °

## 2017-07-06 NOTE — ED Triage Notes (Signed)
Pt BIB [facility] with abdominal pain. States he ate pizza and cheerwine today.

## 2017-07-07 ENCOUNTER — Encounter (HOSPITAL_COMMUNITY): Payer: Self-pay | Admitting: Emergency Medicine

## 2017-07-07 ENCOUNTER — Emergency Department (HOSPITAL_COMMUNITY)
Admission: EM | Admit: 2017-07-07 | Discharge: 2017-07-07 | Disposition: A | Discharge status: Home / Self Care | Payer: Self-pay | Attending: Emergency Medicine | Admitting: Emergency Medicine

## 2017-07-07 DIAGNOSIS — R197 Diarrhea, unspecified: Secondary | ICD-10-CM | POA: Insufficient documentation

## 2017-07-07 DIAGNOSIS — F17210 Nicotine dependence, cigarettes, uncomplicated: Secondary | ICD-10-CM | POA: Insufficient documentation

## 2017-07-07 DIAGNOSIS — R1084 Generalized abdominal pain: Secondary | ICD-10-CM | POA: Insufficient documentation

## 2017-07-07 DIAGNOSIS — I10 Essential (primary) hypertension: Secondary | ICD-10-CM | POA: Insufficient documentation

## 2017-07-07 DIAGNOSIS — J45909 Unspecified asthma, uncomplicated: Secondary | ICD-10-CM | POA: Insufficient documentation

## 2017-07-07 DIAGNOSIS — R112 Nausea with vomiting, unspecified: Secondary | ICD-10-CM | POA: Insufficient documentation

## 2017-07-07 DIAGNOSIS — Z79899 Other long term (current) drug therapy: Secondary | ICD-10-CM | POA: Insufficient documentation

## 2017-07-07 LAB — COMPREHENSIVE METABOLIC PANEL WITH GFR
ALT: 49 U/L (ref 17–63)
AST: 64 U/L — ABNORMAL HIGH (ref 15–41)
Albumin: 3.6 g/dL (ref 3.5–5.0)
Alkaline Phosphatase: 81 U/L (ref 38–126)
Anion gap: 11 (ref 5–15)
BUN: 10 mg/dL (ref 6–20)
CO2: 19 mmol/L — ABNORMAL LOW (ref 22–32)
Calcium: 8.5 mg/dL — ABNORMAL LOW (ref 8.9–10.3)
Chloride: 106 mmol/L (ref 101–111)
Creatinine, Ser: 0.6 mg/dL — ABNORMAL LOW (ref 0.61–1.24)
GFR calc Af Amer: >60 mL/min
GFR calc non Af Amer: >60 mL/min
Glucose, Bld: 88 mg/dL (ref 65–99)
Potassium: 4.1 mmol/L (ref 3.5–5.1)
Sodium: 136 mmol/L (ref 135–145)
Total Bilirubin: 0.5 mg/dL (ref 0.3–1.2)
Total Protein: 6.8 g/dL (ref 6.5–8.1)

## 2017-07-07 LAB — URINALYSIS, ROUTINE W REFLEX MICROSCOPIC
Bilirubin Urine: NEGATIVE
Glucose, UA: NEGATIVE mg/dL
Hgb urine dipstick: NEGATIVE
Ketones, ur: NEGATIVE mg/dL
Leukocytes, UA: NEGATIVE
Nitrite: NEGATIVE
Protein, ur: NEGATIVE mg/dL
Specific Gravity, Urine: 1.013 (ref 1.005–1.030)
pH: 5 (ref 5.0–8.0)

## 2017-07-07 LAB — CBC
HCT: 32.7 % — ABNORMAL LOW (ref 39.0–52.0)
Hemoglobin: 10.2 g/dL — ABNORMAL LOW (ref 13.0–17.0)
MCH: 27.3 pg (ref 26.0–34.0)
MCHC: 31.2 g/dL (ref 30.0–36.0)
MCV: 87.7 fL (ref 78.0–100.0)
Platelets: 280 K/uL (ref 150–400)
RBC: 3.73 MIL/uL — ABNORMAL LOW (ref 4.22–5.81)
RDW: 16.5 % — ABNORMAL HIGH (ref 11.5–15.5)
WBC: 12.4 K/uL — ABNORMAL HIGH (ref 4.0–10.5)

## 2017-07-07 LAB — LIPASE, BLOOD: Lipase: 55 U/L — ABNORMAL HIGH (ref 11–51)

## 2017-07-07 MED ORDER — KETOROLAC TROMETHAMINE 30 MG/ML IJ SOLN
30.0000 mg | Freq: Once | INTRAMUSCULAR | Status: AC
  Administered 2017-07-07: 30 mg via INTRAMUSCULAR
  Filled 2017-07-07: qty 1

## 2017-07-07 MED ORDER — PANTOPRAZOLE SODIUM 40 MG PO TBEC
40.0000 mg | DELAYED_RELEASE_TABLET | Freq: Once | ORAL | Status: AC
  Administered 2017-07-07: 40 mg via ORAL
  Filled 2017-07-07: qty 1

## 2017-07-07 MED ORDER — GI COCKTAIL ~~LOC~~
30.0000 mL | Freq: Once | ORAL | Status: AC
  Administered 2017-07-07: 30 mL via ORAL
  Filled 2017-07-07: qty 30

## 2017-07-07 MED ORDER — ONDANSETRON 4 MG PO TBDP
8.0000 mg | ORAL_TABLET | Freq: Once | ORAL | Status: AC
  Administered 2017-07-07: 8 mg via ORAL
  Filled 2017-07-07: qty 2

## 2017-07-07 NOTE — ED Triage Notes (Signed)
Pt arrives via EMS from home with c/o abd pain x3 days since drinking ETOH. Denies N/V/D.

## 2017-07-07 NOTE — ED Notes (Signed)
Patient given ginger ale. 

## 2017-07-07 NOTE — ED Notes (Signed)
Ginger ale given to drink. Patient continues to ask for Ketamine.

## 2017-07-07 NOTE — Discharge Instructions (Addendum)
Your labs are overall reassuring and your pancreatitis is improving. Please continue with your home medications, you need to go and pick up your zofran prescription. Please follow up with primary care provider, continue to avoid alcohol. If you develop worsening focal abdominal pain, severe nausea and vomiting and you're unable to keep down fluids or medications, blood in the stool, fevers or chills or other new or concerning symptoms please return to the ED.

## 2017-07-07 NOTE — ED Provider Notes (Signed)
[facility] EMERGENCY DEPARTMENT Provider Note   CSN: [id number] Arrival date & time: 07/07/17  0453     History   Chief Complaint Chief Complaint  Patient presents with  . Abdominal Pain    HPI [name] [name] is a 47 y.o. male.  [name] is a 47 y.o. Male with a history of chronic alcohol abuse, cocaine abuse, chronic abdominal pain/pancreatitis, who presents today with complaints of continued abdominal pain, nausea, vomiting, diarrhea. Pt reports this has been going on for the past several days, and he has been seen multiple times here in the ED, most recently yesterday. Pt describes abdominal pain as generalized and constant, reports he took a few oxycodone at home, but has ran out, has not tried anything else, he reports he picked up his creon and Protonix from the pharmacy, but has not picked up the zofran he was prescribed yesterday. Pt denies any melena, hematochezia or hematemesis, no fevers or chills, no urinary symptoms. [name] last drink was 2-3 days ago, pt denies tremors, palpations, hallucinations or fevers. Pt repreatedly requesting ketamine.      Past Medical History:  Diagnosis Date  . Alcoholism /alcohol abuse ([place])   . Anemia   . Anxiety   . Arthritis    "knees; arms; elbows" (03/26/2015)  . Asthma   . Bipolar disorder ([place])   . Chronic bronchitis ([place])   . Chronic lower back pain   . Cocaine abuse ([place])   . Depression   . Family history of adverse reaction to anesthesia    "grandmother gets confused"  . Femoral condyle fracture ([place]) 03/08/2014   left medial/notes 03/09/2014  . GERD (gastroesophageal reflux disease)   . H/O hiatal hernia   . H/O suicide attempt 10/2012  . Heart murmur    "when he was little" (03/06/2013)  . High cholesterol   . History of blood transfusion 10/2012   "when I tried to commit suicide"  . History of stomach ulcers   . Hypertension   . Marijuana abuse, continuous   . Migraine    "a  few times/year" (03/26/2015)  . Pancreatitis   . Pneumonia 1990's X 3  . PTSD (post-traumatic stress disorder)   . Shortness of breath    "can happen at anytime" (03/06/2013)  . Sickle cell trait ([place])   . WPW (Wolff-Parkinson-White syndrome)    [name] 03/06/2013    Patient Active Problem List   Diagnosis Date Noted  . Abdominal pain 05/27/2017  . Hematemesis 05/27/2017  . Tachycardia 03/18/2017  . Nausea & vomiting 03/18/2017  . Diarrhea 03/18/2017  . Acute on chronic pancreatitis ([place]) 12/17/2016  . Chronic alcoholic pancreatitis ([place]) 12/05/2016  . Intractable nausea and vomiting 12/05/2016  . Verbally abusive behavior 12/05/2016  . Normocytic anemia 12/05/2016  . Alcohol use disorder, severe, dependence ([place]) 07/25/2016  . Cocaine use disorder, severe, dependence ([place]) 07/25/2016  . 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 Leukocytosis   . Hospital acquired PNA 05/20/2015  . Pancreatitis 05/18/2015  . Pseudocyst of pancreas 05/18/2015  . Polysubstance abuse (tobacco, cocaine, THC, and ETOH) 03/26/2015  . Alcohol-induced chronic pancreatitis ([place])   . Benign essential HTN 02/06/2014  . Alcohol-induced acute pancreatitis 11/28/2013  . Pancreatic pseudocyst/cyst 11/25/2013  . MDD (major depressive disorder), recurrent severe, without psychosis ([place]) 10/22/2013  . Severe protein-calorie malnutrition ([place]) 10/10/2013  . Suicide attempt ([place]) 10/08/2013  . 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  TOBACCO ABUSE 03/23/2007    Past Surgical History:  Procedure Laterality Date  . CARDIAC CATHETERIZATION    . EYE SURGERY Left 1990's   "result of trauma"   . FACIAL FRACTURE SURGERY Left 1990's   "result of trauma"   . FRACTURE SURGERY    . HERNIA REPAIR    . LEFT HEART CATHETERIZATION WITH CORONARY ANGIOGRAM Right 03/07/2013   Procedure: LEFT HEART CATHETERIZATION WITH CORONARY ANGIOGRAM;   Surgeon: [name], MD;  Location: [facility] CATH LAB;  Service: Cardiovascular;  Laterality: Right;  . UMBILICAL HERNIA REPAIR         Home Medications    Prior to Admission medications   Medication Sig Start Date End Date Taking? Authorizing Provider  albuterol (PROVENTIL HFA;VENTOLIN HFA) 108 (90 Base) MCG/ACT inhaler Inhale 2 puffs into the lungs every 6 (six) hours as needed for wheezing or shortness of breath. 07/25/16   Lindell Spar I, NP  dicyclomine (BENTYL) 20 MG tablet Take 1 tablet (20 mg total) by mouth 2 (two) times daily. 06/14/17   Palumbo, April, MD  famotidine (PEPCID) 20 MG tablet Take 1 tablet (20 mg total) by mouth 2 (two) times daily. 07/05/17   Sherwood Gambler, MD  gabapentin (NEURONTIN) 300 MG capsule Take 1 capsule (300 mg total) by mouth 3 (three) times daily. For agitation Patient taking differently: Take 300 mg by mouth 2 (two) times daily.  07/25/16   Lindell Spar I, NP  hydrALAZINE (APRESOLINE) 25 MG tablet Take 1 tablet (25 mg total) by mouth every 8 (eight) hours. Patient taking differently: Take 25 mg by mouth at bedtime.  12/29/16   Barton Dubois, MD  lipase/protease/amylase (CREON) 12000 units CPEP capsule Take 2 capsules (24,000 Units total) by mouth 3 (three) times daily with meals. For pancreatitis 06/06/17   [name], MD  loratadine (CLARITIN) 10 MG tablet Take 1 tablet (10 mg total) by mouth daily. (May purchase from over the counter): For allergies 07/26/16   Lindell Spar I, NP  Menthol, Topical Analgesic, (BENGAY EX) Apply 1 application topically 3 (three) times daily as needed (joint pain).    [provider]  metoprolol tartrate (LOPRESSOR) 25 MG tablet Take 1 tablet (25 mg total) by mouth 2 (two) times daily. For high blood pressure 12/29/16   Barton Dubois, MD  ondansetron (ZOFRAN) 4 MG tablet Take 1 tablet (4 mg total) by mouth every 6 (six) hours. 07/06/17   Hedges, Dellis Filbert, PA-C  oxyCODONE (ROXICODONE) 5 MG immediate release tablet Take 1  tablet (5 mg total) by mouth every 8 (eight) hours as needed. 05/30/17 05/30/18  Georgette Shell, MD  pantoprazole (PROTONIX) 40 MG tablet Take 1 tablet (40 mg total) by mouth daily. 06/09/17   [name], MD  Polyethyl Glycol-Propyl Glycol (SYSTANE OP) Place 1 drop into both eyes daily.    [provider]  sertraline (ZOLOFT) 25 MG tablet Take 3 tablets (75 mg total) by mouth daily. For depression 07/26/16   Lindell Spar I, NP  sucralfate (CARAFATE) 1 g tablet Take 1 tablet (1 g total) by mouth 4 (four) times daily -  with meals and at bedtime. 06/09/17   [name], MD  thiamine 100 MG tablet Take 1 tablet (100 mg total) by mouth daily. 12/30/16   Barton Dubois, MD  omeprazole (PRILOSEC) 20 MG capsule Take 1 capsule (20 mg total) daily by mouth. 03/22/17 06/21/17  Varney Biles, MD    Family History Family History  Problem Relation Age of Onset  .  Hypertension Mother   . Cirrhosis Mother   . Alcoholism Mother   . Hypertension Father   . Melanoma Father   . Hypertension Other   . Coronary artery disease Other     Social History Social History   Tobacco Use  . Smoking status: Current Every Day Smoker    Packs/day: 1.00    Types: Cigarettes, E-cigarettes  . Smokeless tobacco: Never Used  Substance Use Topics  . Alcohol use: Yes    Comment: last drink 07/01/17  . Drug use: Yes    Types: Marijuana     Allergies   Robaxin [methocarbamol]; Shellfish-derived products; Trazodone and nefazodone; Contrast media [iodinated diagnostic agents]; and Reglan [metoclopramide]   Review of Systems Review of Systems  Constitutional: Negative for chills and fever.  HENT: Negative for congestion, rhinorrhea and sore throat.   Eyes: Negative for visual disturbance.  Respiratory: Negative for shortness of breath.   Cardiovascular: Negative for chest pain.  Gastrointestinal: Positive for abdominal pain, diarrhea, nausea and vomiting. Negative for blood in stool.  Genitourinary:  Negative for dysuria.  Musculoskeletal: Negative for arthralgias and myalgias.  Skin: Negative for rash.  Neurological: Negative for dizziness, syncope and light-headedness.     Physical Exam Updated Vital Signs BP 124/87 (BP Location: Right Arm)   Pulse 90   Temp 98.3 F (36.8 C) (Oral)   Resp 18   Ht 5\' 9"  (1.753 m)   Wt 63.5 kg (140 lb)   SpO2 100%   BMI 20.67 kg/m   Physical Exam  Constitutional: He appears well-developed and well-nourished. No distress.  HENT:  Head: Normocephalic and atraumatic.  Mouth/Throat: Oropharynx is clear and moist.  Eyes: Right eye exhibits no discharge. Left eye exhibits no discharge.  Neck: Neck supple.  Cardiovascular: Normal rate, regular rhythm, normal heart sounds and intact distal pulses.  Pulmonary/Chest: Effort normal and breath sounds normal. No stridor. No respiratory distress. He has no wheezes. He has no rales.  Respirations equal and unlabored, patient able to speak in full sentences, lungs clear to auscultation bilaterally  Abdominal: Soft. Bowel sounds are normal. He exhibits no distension and no mass. There is tenderness. There is no rebound and no guarding.  Generalized abdominal tenderness, nonfocal, no peritoneal signs  Musculoskeletal: He exhibits no edema or deformity.  Neurological: He is alert. Coordination normal.  Skin: Skin is warm and dry. Capillary refill takes less than 2 seconds. He is not diaphoretic.  Psychiatric: He has a normal mood and affect. His behavior is normal.  Nursing note and vitals reviewed.    ED Treatments / Results  Labs (all labs ordered are listed, but only abnormal results are displayed) Labs Reviewed  LIPASE, BLOOD - Abnormal; Notable for the following components:      Result Value   Lipase 55 (*)    All other components within normal limits  COMPREHENSIVE METABOLIC PANEL - Abnormal; Notable for the following components:   CO2 19 (*)    Creatinine, Ser 0.60 (*)    Calcium 8.5 (*)      AST 64 (*)    All other components within normal limits  CBC - Abnormal; Notable for the following components:   WBC 12.4 (*)    RBC 3.73 (*)    Hemoglobin 10.2 (*)    HCT 32.7 (*)    RDW 16.5 (*)    All other components within normal limits  URINALYSIS, ROUTINE W REFLEX MICROSCOPIC    EKG  EKG Interpretation None  Radiology Dg Chest 2 View  Result Date: 07/05/2017 CLINICAL DATA:  Chest pain EXAM: CHEST  2 VIEW COMPARISON:  03/05/2017 FINDINGS: The heart size and mediastinal contours are within normal limits. Both lungs are clear. The visualized skeletal structures are unremarkable. IMPRESSION: No active cardiopulmonary disease. Electronically Signed   By: Donavan Foil M.D.   On: 07/05/2017 18:54    Procedures Procedures (including critical care time)  Medications Ordered in ED Medications  pantoprazole (PROTONIX) EC tablet 40 mg (not administered)  ondansetron (ZOFRAN-ODT) disintegrating tablet 8 mg (8 mg Oral Given 07/07/17 0705)  gi cocktail (Maalox,Lidocaine,Donnatal) (30 mLs Oral Given 07/07/17 0705)  ketorolac (TORADOL) 30 MG/ML injection 30 mg (30 mg Intramuscular Given 07/07/17 0705)     Initial Impression / Assessment and Plan / ED Course  I have reviewed the triage vital signs and the nursing notes.  Pertinent labs & imaging results that were available during my care of the patient were reviewed by me and considered in my medical decision making (see chart for details).  Pt with normal vitals and in NAD, presents with abdominal pain, N/V/D, was seen yesterday for the same,symptoms are unchanged and this appears to be consistent with patient numerous presentation for his chronic abdominal pain/pancreatitis. Abdomen with generalized tenderness, non-focal, not concerning for acute surgical abdomen. Labs overall reassuring lipase improved from yesterday, now 55, was 79. Mild leukocytosis of 12.4 feel this is likely due to stress given reassuring abdominal exam and  afebrile. No electrolyte derangements. Liver enzymes unremarkable. UA w/o signs of infection. Will give toradol, zofran, GI cocktail and GI cocktail, pt repeatedly requesting ketamine, but I do not feel this is appropriate at this time. Pt not exhibiting signs of withdrawal, normal vitals, movements are smooth with tremor, no diaphoresis. Pt tolerating PO with no episodes of vomiting here in ED. I feel pt is stable for discharge home, pt to follow up with his PCP, return precautions discussed. Encouraged pt to continue to abstain from alcohol. Return precautions discussed.  Final Clinical Impressions(s) / ED Diagnoses   Final diagnoses:  Generalized abdominal pain  Nausea vomiting and diarrhea    ED Discharge Orders    None       Janet Berlin 07/07/17 Claudette Laws, MD 07/07/17 [phone]

## 2017-07-08 ENCOUNTER — Emergency Department (HOSPITAL_COMMUNITY)
Admission: EM | Admit: 2017-07-08 | Discharge: 2017-07-08 | Disposition: A | Discharge status: Home / Self Care | Payer: Self-pay | Attending: Emergency Medicine | Admitting: Emergency Medicine

## 2017-07-08 ENCOUNTER — Encounter (HOSPITAL_COMMUNITY): Payer: Self-pay

## 2017-07-08 DIAGNOSIS — D573 Sickle-cell trait: Secondary | ICD-10-CM | POA: Insufficient documentation

## 2017-07-08 DIAGNOSIS — F17210 Nicotine dependence, cigarettes, uncomplicated: Secondary | ICD-10-CM | POA: Insufficient documentation

## 2017-07-08 DIAGNOSIS — J45909 Unspecified asthma, uncomplicated: Secondary | ICD-10-CM | POA: Insufficient documentation

## 2017-07-08 DIAGNOSIS — R1013 Epigastric pain: Secondary | ICD-10-CM | POA: Insufficient documentation

## 2017-07-08 DIAGNOSIS — Z79899 Other long term (current) drug therapy: Secondary | ICD-10-CM | POA: Insufficient documentation

## 2017-07-08 DIAGNOSIS — I10 Essential (primary) hypertension: Secondary | ICD-10-CM | POA: Insufficient documentation

## 2017-07-08 DIAGNOSIS — E7800 Pure hypercholesterolemia, unspecified: Secondary | ICD-10-CM | POA: Insufficient documentation

## 2017-07-08 DIAGNOSIS — I456 Pre-excitation syndrome: Secondary | ICD-10-CM | POA: Insufficient documentation

## 2017-07-08 MED ORDER — LORAZEPAM 1 MG PO TABS
1.0000 mg | ORAL_TABLET | Freq: Once | ORAL | Status: AC
  Administered 2017-07-08: 1 mg via ORAL
  Filled 2017-07-08: qty 1

## 2017-07-08 MED ORDER — TRAMADOL HCL 50 MG PO TABS
50.0000 mg | ORAL_TABLET | Freq: Once | ORAL | Status: AC
  Administered 2017-07-08: 50 mg via ORAL
  Filled 2017-07-08: qty 1

## 2017-07-08 MED ORDER — KETOROLAC TROMETHAMINE 30 MG/ML IJ SOLN
15.0000 mg | Freq: Once | INTRAMUSCULAR | Status: AC
  Administered 2017-07-08: 15 mg via INTRAMUSCULAR
  Filled 2017-07-08: qty 1

## 2017-07-08 MED ORDER — GI COCKTAIL ~~LOC~~
30.0000 mL | Freq: Once | ORAL | Status: AC
  Administered 2017-07-08: 30 mL via ORAL
  Filled 2017-07-08: qty 30

## 2017-07-08 NOTE — ED Notes (Signed)
ED Provider at bedside. 

## 2017-07-08 NOTE — ED Triage Notes (Signed)
Pt arrives via EMS with c/o Abdominal pain that started at 2 am; pt denies n/v on arrival. Pt has hx of chronic pancreatitis and alcohol use-Monique,RN

## 2017-07-08 NOTE — Discharge Instructions (Signed)
As discussed, please be sure to follow-up with your physician. You may use the provided contact information to inquire about [facility] financial aid services to facilitate following up with a gastroenterologist as well.  Return here for concerning changes in your condition.

## 2017-07-08 NOTE — ED Provider Notes (Signed)
[name] Provider Note   CSN: [id number] Arrival date & time: 07/08/17  0509     History   Chief Complaint Chief Complaint  Patient presents with  . Abdominal Pain  . Pancreatitis    HPI [name] is a 47 y.o. male.  HPI Presents for the second time in 2 days, with concern of ongoing abdominal pain. He notes that he has not had anything to drink since yesterday, he is, in the upper abdomen, sharp, burning, nonradiating. There is associated nausea, but no vomiting, no diarrhea, no fever. He has not had follow-up visit with anyone since yesterday's visit, nor in the recent past. He notes that he is having monetary difficulties, with following up with GI. He does, however, receive medical care at [facility].  Past Medical History:  Diagnosis Date  . Alcoholism /alcohol abuse ([place])   . Anemia   . Anxiety   . Arthritis    "knees; arms; elbows" (03/26/2015)  . Asthma   . Bipolar disorder ([place])   . Chronic bronchitis ([place])   . Chronic lower back pain   . Cocaine abuse ([place])   . Depression   . Family history of adverse reaction to anesthesia    "grandmother gets confused"  . Femoral condyle fracture ([place]) 03/08/2014   left medial/notes 03/09/2014  . GERD (gastroesophageal reflux disease)   . H/O hiatal hernia   . H/O suicide attempt 10/2012  . Heart murmur    "when he was little" (03/06/2013)  . High cholesterol   . History of blood transfusion 10/2012   "when I tried to commit suicide"  . History of stomach ulcers   . Hypertension   . Marijuana abuse, continuous   . Migraine    "a few times/year" (03/26/2015)  . Pancreatitis   . Pneumonia 1990's X 3  . PTSD (post-traumatic stress disorder)   . Shortness of breath    "can happen at anytime" (03/06/2013)  . Sickle cell trait ([place])   . WPW (Wolff-Parkinson-White syndrome)    [name] 03/06/2013    Patient Active Problem List   Diagnosis Date Noted  . Abdominal pain  05/27/2017  . Hematemesis 05/27/2017  . Tachycardia 03/18/2017  . Nausea & vomiting 03/18/2017  . Diarrhea 03/18/2017  . Acute on chronic pancreatitis ([place]) 12/17/2016  . Chronic alcoholic pancreatitis ([place]) 12/05/2016  . Intractable nausea and vomiting 12/05/2016  . Verbally abusive behavior 12/05/2016  . Normocytic anemia 12/05/2016  . Alcohol use disorder, severe, dependence ([place]) 07/25/2016  . Cocaine use disorder, severe, dependence ([place]) 07/25/2016  . Major depressive disorder, recurrent severe without psychotic features ([place]) 07/20/2016  . Leukocytosis   . Hospital acquired PNA 05/20/2015  . Pancreatitis 05/18/2015  . Pseudocyst of pancreas 05/18/2015  . Polysubstance abuse (tobacco, cocaine, THC, and ETOH) 03/26/2015  . Alcohol-induced chronic pancreatitis ([place])   . Benign essential HTN 02/06/2014  . Alcohol-induced acute pancreatitis 11/28/2013  . Pancreatic pseudocyst/cyst 11/25/2013  . MDD (major depressive disorder), recurrent severe, without psychosis ([place]) 10/22/2013  . Severe protein-calorie malnutrition ([place]) 10/10/2013  . Suicide attempt ([name]) 10/08/2013  . 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 TOBACCO ABUSE 03/23/2007    Past Surgical History:  Procedure Laterality Date  . CARDIAC CATHETERIZATION    . EYE SURGERY Left 1990's   "result of trauma"   . FACIAL FRACTURE SURGERY Left 1990's   "result of trauma"   . FRACTURE SURGERY    . HERNIA REPAIR    .  LEFT HEART CATHETERIZATION WITH CORONARY ANGIOGRAM Right 03/07/2013   Procedure: LEFT HEART CATHETERIZATION WITH CORONARY ANGIOGRAM;  Surgeon: [name], MD;  Location: [place] CATH LAB;  Service: Cardiovascular;  Laterality: Right;  . UMBILICAL HERNIA REPAIR         Home Medications    Prior to Admission medications   Medication Sig Start Date End Date Taking? Authorizing Provider  albuterol (PROVENTIL HFA;VENTOLIN HFA) 108 (90 Base) MCG/ACT inhaler Inhale 2 puffs into  the lungs every 6 (six) hours as needed for wheezing or shortness of breath. 07/25/16   Lindell Spar I, NP  dicyclomine (BENTYL) 20 MG tablet Take 1 tablet (20 mg total) by mouth 2 (two) times daily. 06/14/17   Palumbo, April, MD  famotidine (PEPCID) 20 MG tablet Take 1 tablet (20 mg total) by mouth 2 (two) times daily. 07/05/17   Sherwood Gambler, MD  gabapentin (NEURONTIN) 300 MG capsule Take 1 capsule (300 mg total) by mouth 3 (three) times daily. For agitation Patient taking differently: Take 300 mg by mouth 2 (two) times daily.  07/25/16   Lindell Spar I, NP  hydrALAZINE (APRESOLINE) 25 MG tablet Take 1 tablet (25 mg total) by mouth every 8 (eight) hours. Patient taking differently: Take 25 mg by mouth at bedtime.  12/29/16   Barton Dubois, MD  lipase/protease/amylase (CREON) 12000 units CPEP capsule Take 2 capsules (24,000 Units total) by mouth 3 (three) times daily with meals. For pancreatitis 06/06/17   [name], MD  loratadine (CLARITIN) 10 MG tablet Take 1 tablet (10 mg total) by mouth daily. (May purchase from over the counter): For allergies 07/26/16   Lindell Spar I, NP  Menthol, Topical Analgesic, (BENGAY EX) Apply 1 application topically 3 (three) times daily as needed (joint pain).    [provider]  metoprolol tartrate (LOPRESSOR) 25 MG tablet Take 1 tablet (25 mg total) by mouth 2 (two) times daily. For high blood pressure 12/29/16   Barton Dubois, MD  ondansetron (ZOFRAN) 4 MG tablet Take 1 tablet (4 mg total) by mouth every 6 (six) hours. 07/06/17   Hedges, Dellis Filbert, PA-C  oxyCODONE (ROXICODONE) 5 MG immediate release tablet Take 1 tablet (5 mg total) by mouth every 8 (eight) hours as needed. 05/30/17 05/30/18  Georgette Shell, MD  pantoprazole (PROTONIX) 40 MG tablet Take 1 tablet (40 mg total) by mouth daily. 06/09/17   [name], MD  Polyethyl Glycol-Propyl Glycol (SYSTANE OP) Place 1 drop into both eyes daily.    [provider]  sertraline (ZOLOFT) 25 MG  tablet Take 3 tablets (75 mg total) by mouth daily. For depression 07/26/16   Lindell Spar I, NP  sucralfate (CARAFATE) 1 g tablet Take 1 tablet (1 g total) by mouth 4 (four) times daily -  with meals and at bedtime. 06/09/17   [name], MD  thiamine 100 MG tablet Take 1 tablet (100 mg total) by mouth daily. 12/30/16   Barton Dubois, MD  omeprazole (PRILOSEC) 20 MG capsule Take 1 capsule (20 mg total) daily by mouth. 03/22/17 06/21/17  Varney Biles, MD    Family History Family History  Problem Relation Age of Onset  . Hypertension Mother   . Cirrhosis Mother   . Alcoholism Mother   . Hypertension Father   . Melanoma Father   . Hypertension Other   . Coronary artery disease Other     Social History Social History   Tobacco Use  . Smoking status: Current Every Day Smoker  Packs/day: 1.00    Types: Cigarettes, E-cigarettes  . Smokeless tobacco: Never Used  Substance Use Topics  . Alcohol use: Yes    Comment: last drink 07/01/17  . Drug use: Yes    Types: Marijuana     Allergies   Robaxin [methocarbamol]; Shellfish-derived products; Trazodone and nefazodone; Contrast media [iodinated diagnostic agents]; and Reglan [metoclopramide]   Review of Systems Review of Systems  Constitutional:       Per HPI, otherwise negative  HENT:       Per HPI, otherwise negative  Respiratory:       Per HPI, otherwise negative  Cardiovascular:       Per HPI, otherwise negative  Gastrointestinal: Positive for abdominal pain and nausea. Negative for vomiting.  Endocrine:       Negative aside from HPI  Genitourinary:       Neg aside from HPI   Musculoskeletal:       Per HPI, otherwise negative  Skin: Negative.   Neurological: Negative for syncope.  Psychiatric/Behavioral: The patient is nervous/anxious.      Physical Exam Updated Vital Signs BP (!) 134/94   Pulse 80   Temp 98.4 F (36.9 C) (Oral)   Resp 18   SpO2 100%   Physical Exam  Constitutional: He is oriented to  person, place, and time. He appears well-developed. No distress.  HENT:  Head: Normocephalic and atraumatic.  Eyes: Conjunctivae and EOM are normal.  Cardiovascular: Normal rate and regular rhythm.  Pulmonary/Chest: Effort normal. No stridor. No respiratory distress.  Abdominal: He exhibits no distension.  Tender to palpation only in the midline upper abdomen, otherwise soft, non-peritoneal, no guarding  Musculoskeletal: He exhibits no edema.  Neurological: He is alert and oriented to person, place, and time.  Skin: Skin is warm and dry.  Psychiatric: He has a normal mood and affect.  Nursing note and vitals reviewed.    ED Treatments / Results   Procedures Procedures (including critical care time)  Medications Ordered in ED Medications  gi cocktail (Maalox,Lidocaine,Donnatal) (30 mLs Oral Given 07/08/17 0626)  LORazepam (ATIVAN) tablet 1 mg (1 mg Oral Given 07/08/17 0626)  ketorolac (TORADOL) 30 MG/ML injection 15 mg (15 mg Intramuscular Given 07/08/17 0626)  traMADol (ULTRAM) tablet 50 mg (50 mg Oral Given 07/08/17 5176)     Initial Impression / Assessment and Plan / ED Course  I have reviewed the triage vital signs and the nursing notes.  Pertinent labs & imaging results that were available during my care of the patient were reviewed by me and considered in my medical decision making (see chart for details).   This gentleman well-known to our Moran for abdominal pain presentations presents with ongoing abdominal pain.  On here he is awake, alert, afebrile, hemodynamically unremarkable, in no distress per He does have some tenderness to palpation, and a typical area for him, but without distention, peritoneal findings, fever, there is low suspicion for acute new features, including no evidence for peritonitis, necrosis of his pancreas. Patient did receive analgesia, Ativan due to his endorsement of not drinking alcohol since yesterday, and was discharged with instructions to  follow-up with primary care and/or GI.  Final Clinical Impressions(s) / ED Diagnoses  Abdominal pain   Carmin Muskrat, MD 07/08/17 [phone]

## 2017-07-10 ENCOUNTER — Emergency Department (HOSPITAL_COMMUNITY)
Admission: EM | Admit: 2017-07-10 | Discharge: 2017-07-10 | Disposition: A | Discharge status: Home / Self Care | Payer: Self-pay | Attending: Emergency Medicine | Admitting: Emergency Medicine

## 2017-07-10 ENCOUNTER — Encounter (HOSPITAL_COMMUNITY): Payer: Self-pay

## 2017-07-10 ENCOUNTER — Other Ambulatory Visit: Payer: Self-pay

## 2017-07-10 DIAGNOSIS — I10 Essential (primary) hypertension: Secondary | ICD-10-CM | POA: Insufficient documentation

## 2017-07-10 DIAGNOSIS — J45909 Unspecified asthma, uncomplicated: Secondary | ICD-10-CM | POA: Insufficient documentation

## 2017-07-10 DIAGNOSIS — Z79899 Other long term (current) drug therapy: Secondary | ICD-10-CM | POA: Insufficient documentation

## 2017-07-10 DIAGNOSIS — K852 Alcohol induced acute pancreatitis without necrosis or infection: Secondary | ICD-10-CM | POA: Insufficient documentation

## 2017-07-10 DIAGNOSIS — F17210 Nicotine dependence, cigarettes, uncomplicated: Secondary | ICD-10-CM | POA: Insufficient documentation

## 2017-07-10 DIAGNOSIS — F1099 Alcohol use, unspecified with unspecified alcohol-induced disorder: Secondary | ICD-10-CM | POA: Insufficient documentation

## 2017-07-10 LAB — CBC WITH DIFFERENTIAL/PLATELET
BASOS PCT: 0 %
Basophils Absolute: 0 10*3/uL (ref 0.0–0.1)
EOS ABS: 0.4 10*3/uL (ref 0.0–0.7)
EOS PCT: 3 %
HEMATOCRIT: 29.6 % — AB (ref 39.0–52.0)
Hemoglobin: 9.7 g/dL — ABNORMAL LOW (ref 13.0–17.0)
LYMPHS ABS: 2.5 10*3/uL (ref 0.7–4.0)
Lymphocytes Relative: 17 %
MCH: 27.9 pg (ref 26.0–34.0)
MCHC: 32.8 g/dL (ref 30.0–36.0)
MCV: 85.1 fL (ref 78.0–100.0)
MONO ABS: 0.9 10*3/uL (ref 0.1–1.0)
Monocytes Relative: 6 %
NEUTROS PCT: 74 %
Neutro Abs: 11 10*3/uL — ABNORMAL HIGH (ref 1.7–7.7)
PLATELETS: 285 10*3/uL (ref 150–400)
RBC: 3.48 MIL/uL — ABNORMAL LOW (ref 4.22–5.81)
RDW: 16.2 % — AB (ref 11.5–15.5)
Smear Review: ADEQUATE
WBC: 14.8 10*3/uL — ABNORMAL HIGH (ref 4.0–10.5)

## 2017-07-10 LAB — COMPREHENSIVE METABOLIC PANEL
ALT: 27 U/L (ref 17–63)
AST: 24 U/L (ref 15–41)
Albumin: 3.4 g/dL — ABNORMAL LOW (ref 3.5–5.0)
Alkaline Phosphatase: 88 U/L (ref 38–126)
Anion gap: 14 (ref 5–15)
BILIRUBIN TOTAL: 0.5 mg/dL (ref 0.3–1.2)
BUN: 6 mg/dL (ref 6–20)
CO2: 21 mmol/L — ABNORMAL LOW (ref 22–32)
CREATININE: 0.64 mg/dL (ref 0.61–1.24)
Calcium: 8.7 mg/dL — ABNORMAL LOW (ref 8.9–10.3)
Chloride: 101 mmol/L (ref 101–111)
GFR calc non Af Amer: >60 mL/min (ref >60)
Glucose, Bld: 114 mg/dL — ABNORMAL HIGH (ref 65–99)
POTASSIUM: 3.1 mmol/L — AB (ref 3.5–5.1)
Sodium: 136 mmol/L (ref 135–145)
TOTAL PROTEIN: 6.5 g/dL (ref 6.5–8.1)

## 2017-07-10 LAB — LIPASE, BLOOD: LIPASE: 150 U/L — AB (ref 11–51)

## 2017-07-10 LAB — ETHANOL: ALCOHOL ETHYL (B): 78 mg/dL — AB (ref <10)

## 2017-07-10 MED ORDER — MORPHINE SULFATE (PF) 4 MG/ML IV SOLN
6.0000 mg | Freq: Once | INTRAVENOUS | Status: AC
  Administered 2017-07-10: 6 mg via INTRAVENOUS
  Filled 2017-07-10: qty 2

## 2017-07-10 MED ORDER — DICYCLOMINE HCL 10 MG/ML IM SOLN
20.0000 mg | Freq: Once | INTRAMUSCULAR | Status: AC
  Administered 2017-07-10: 20 mg via INTRAMUSCULAR
  Filled 2017-07-10: qty 2

## 2017-07-10 MED ORDER — FAMOTIDINE 20 MG PO TABS: 20.0000 mg | ORAL_TABLET | Freq: Once | ORAL | Status: DC

## 2017-07-10 MED ORDER — SODIUM CHLORIDE 0.9 % IV BOLUS (SEPSIS)
1000.0000 mL | Freq: Once | INTRAVENOUS | Status: AC
  Administered 2017-07-10: 1000 mL via INTRAVENOUS

## 2017-07-10 MED ORDER — GI COCKTAIL ~~LOC~~
30.0000 mL | Freq: Once | ORAL | Status: AC
  Administered 2017-07-10: 30 mL via ORAL
  Filled 2017-07-10: qty 30

## 2017-07-10 MED ORDER — POTASSIUM CHLORIDE CRYS ER 20 MEQ PO TBCR
40.0000 meq | EXTENDED_RELEASE_TABLET | Freq: Once | ORAL | Status: AC
  Administered 2017-07-10: 40 meq via ORAL
  Filled 2017-07-10: qty 2

## 2017-07-10 MED ORDER — PANTOPRAZOLE SODIUM 40 MG IV SOLR
40.0000 mg | Freq: Once | INTRAVENOUS | Status: AC
  Administered 2017-07-10: 40 mg via INTRAVENOUS
  Filled 2017-07-10: qty 40

## 2017-07-10 MED ORDER — SUCRALFATE 1 G PO TABS: 1.0000 g | ORAL_TABLET | Freq: Three times a day (TID) | ORAL | 0 refills | Status: DC

## 2017-07-10 MED ORDER — KETOROLAC TROMETHAMINE 30 MG/ML IJ SOLN
30.0000 mg | Freq: Once | INTRAMUSCULAR | Status: AC
  Administered 2017-07-10: 30 mg via INTRAVENOUS
  Filled 2017-07-10: qty 1

## 2017-07-10 MED ORDER — SUCRALFATE 1 G PO TABS
1.0000 g | ORAL_TABLET | Freq: Once | ORAL | Status: AC
  Administered 2017-07-10: 1 g via ORAL
  Filled 2017-07-10: qty 1

## 2017-07-10 NOTE — ED Notes (Signed)
ED Provider at bedside. 

## 2017-07-10 NOTE — ED Provider Notes (Signed)
[facility] EMERGENCY DEPARTMENT Provider Note   CSN: [id number] Arrival date & time: 07/10/17  1142     History   Chief Complaint Chief Complaint  Patient presents with  . Abdominal Pain  . Pancreatitis    HPI [name] is a 47 y.o. male.  HPI [name] is a 47 y.o. male with history of alcohol abuse, anxiety, bipolar disorder, chronic abdominal pain, polysubstance abuse, presents to emergency department for the 27th time in the last 6 months for a complaint of abdominal pain.  Patient states this feels like his pancreatitis.  Reports sharp pain in the upper abdomen that wraps around to the left flank.  He reports associated nausea and vomiting, as well as diarrhea that started today.  He states he has had this pain for several days.  He admits to drinking alcohol today since it is his birthday, and states his pain became worse after drinking.  He has not tried any medications to help with his symptoms prior to coming in.  He states he is unable to see his primary care doctor because he owes them money.  He was last seen 2 days ago, at that time his blood work was unremarkable.  He states this pain is much more severe than several days ago and feels like his lipase may be "through the roof high." states unable to keep anything down.   Past Medical History:  Diagnosis Date  . Alcoholism /alcohol abuse ([place])   . Anemia   . Anxiety   . Arthritis    "knees; arms; elbows" (03/26/2015)  . Asthma   . Bipolar disorder ([place])   . Chronic bronchitis ([place])   . Chronic lower back pain   . Cocaine abuse ([place])   . Depression   . Family history of adverse reaction to anesthesia    "grandmother gets confused"  . Femoral condyle fracture ([place]) 03/08/2014   left medial/notes 03/09/2014  . GERD (gastroesophageal reflux disease)   . H/O hiatal hernia   . H/O suicide attempt 10/2012  . Heart murmur    "when he was little" (03/06/2013)  . High  cholesterol   . History of blood transfusion 10/2012   "when I tried to commit suicide"  . History of stomach ulcers   . Hypertension   . Marijuana abuse, continuous   . Migraine    "a few times/year" (03/26/2015)  . Pancreatitis   . Pneumonia 1990's X 3  . PTSD (post-traumatic stress disorder)   . Shortness of breath    "can happen at anytime" (03/06/2013)  . Sickle cell trait ([place])   . WPW (Wolff-Parkinson-White syndrome)    [name] 03/06/2013    Patient Active Problem List   Diagnosis Date Noted  . Abdominal pain 05/27/2017  . Hematemesis 05/27/2017  . Tachycardia 03/18/2017  . Nausea & vomiting 03/18/2017  . Diarrhea 03/18/2017  . Acute on chronic pancreatitis ([place]) 12/17/2016  . Chronic alcoholic pancreatitis ([place]) 12/05/2016  . Intractable nausea and vomiting 12/05/2016  . Verbally abusive behavior 12/05/2016  . Normocytic anemia 12/05/2016  . Alcohol use disorder, severe, dependence ([place]) 07/25/2016  . Cocaine use disorder, severe, dependence ([place]) 07/25/2016  . Major depressive disorder, recurrent severe without psychotic features ([name]) 07/20/2016  . Leukocytosis   . Hospital acquired PNA 05/20/2015  . Pancreatitis 05/18/2015  . Pseudocyst of pancreas 05/18/2015  . Polysubstance abuse (tobacco, cocaine, THC, and ETOH) 03/26/2015  . Alcohol-induced chronic pancreatitis ([place])   . Benign essential  HTN 02/06/2014  . Alcohol-induced acute pancreatitis 11/28/2013  . Pancreatic pseudocyst/cyst 11/25/2013  . MDD (major depressive disorder), recurrent severe, without psychosis ([place]) 10/22/2013  . Severe protein-calorie malnutrition ([place]) 10/10/2013  . Suicide attempt ([place]) 10/08/2013  . 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 TOBACCO ABUSE 03/23/2007    Past Surgical History:  Procedure Laterality Date  . CARDIAC CATHETERIZATION    . EYE SURGERY Left 1990's   "result of trauma"   . FACIAL FRACTURE SURGERY Left 1990's   "result of  trauma"   . FRACTURE SURGERY    . HERNIA REPAIR    . LEFT HEART CATHETERIZATION WITH CORONARY ANGIOGRAM Right 03/07/2013   Procedure: LEFT HEART CATHETERIZATION WITH CORONARY ANGIOGRAM;  Surgeon: [name], MD;  Location: [facility] CATH LAB;  Service: Cardiovascular;  Laterality: Right;  . UMBILICAL HERNIA REPAIR         Home Medications    Prior to Admission medications   Medication Sig Start Date End Date Taking? Authorizing Provider  albuterol (PROVENTIL HFA;VENTOLIN HFA) 108 (90 Base) MCG/ACT inhaler Inhale 2 puffs into the lungs every 6 (six) hours as needed for wheezing or shortness of breath. 07/25/16   Lindell Spar I, NP  dicyclomine (BENTYL) 20 MG tablet Take 1 tablet (20 mg total) by mouth 2 (two) times daily. 06/14/17   Palumbo, April, MD  famotidine (PEPCID) 20 MG tablet Take 1 tablet (20 mg total) by mouth 2 (two) times daily. 07/05/17   Sherwood Gambler, MD  gabapentin (NEURONTIN) 300 MG capsule Take 1 capsule (300 mg total) by mouth 3 (three) times daily. For agitation Patient taking differently: Take 300 mg by mouth 2 (two) times daily.  07/25/16   Lindell Spar I, NP  hydrALAZINE (APRESOLINE) 25 MG tablet Take 1 tablet (25 mg total) by mouth every 8 (eight) hours. Patient taking differently: Take 25 mg by mouth at bedtime.  12/29/16   Barton Dubois, MD  lipase/protease/amylase (CREON) 12000 units CPEP capsule Take 2 capsules (24,000 Units total) by mouth 3 (three) times daily with meals. For pancreatitis 06/06/17   [name], MD  loratadine (CLARITIN) 10 MG tablet Take 1 tablet (10 mg total) by mouth daily. (May purchase from over the counter): For allergies 07/26/16   Lindell Spar I, NP  Menthol, Topical Analgesic, (BENGAY EX) Apply 1 application topically 3 (three) times daily as needed (joint pain).    [provider]  metoprolol tartrate (LOPRESSOR) 25 MG tablet Take 1 tablet (25 mg total) by mouth 2 (two) times daily. For high blood pressure 12/29/16   Barton Dubois, MD  ondansetron (ZOFRAN) 4 MG tablet Take 1 tablet (4 mg total) by mouth every 6 (six) hours. 07/06/17   Hedges, Dellis Filbert, PA-C  oxyCODONE (ROXICODONE) 5 MG immediate release tablet Take 1 tablet (5 mg total) by mouth every 8 (eight) hours as needed. 05/30/17 05/30/18  Georgette Shell, MD  pantoprazole (PROTONIX) 40 MG tablet Take 1 tablet (40 mg total) by mouth daily. 06/09/17   [name], MD  Polyethyl Glycol-Propyl Glycol (SYSTANE OP) Place 1 drop into both eyes daily.    [provider]  sertraline (ZOLOFT) 25 MG tablet Take 3 tablets (75 mg total) by mouth daily. For depression 07/26/16   Lindell Spar I, NP  sucralfate (CARAFATE) 1 g tablet Take 1 tablet (1 g total) by mouth 4 (four) times daily -  with meals and at bedtime. 06/09/17   [name], MD  thiamine 100 MG tablet  Take 1 tablet (100 mg total) by mouth daily. 12/30/16   Barton Dubois, MD  omeprazole (PRILOSEC) 20 MG capsule Take 1 capsule (20 mg total) daily by mouth. 03/22/17 06/21/17  Varney Biles, MD    Family History Family History  Problem Relation Age of Onset  . Hypertension Mother   . Cirrhosis Mother   . Alcoholism Mother   . Hypertension Father   . Melanoma Father   . Hypertension Other   . Coronary artery disease Other     Social History Social History   Tobacco Use  . Smoking status: Current Every Day Smoker    Packs/day: 1.00    Types: Cigarettes, E-cigarettes  . Smokeless tobacco: Never Used  Substance Use Topics  . Alcohol use: Yes    Comment: last drink 07/01/17  . Drug use: Yes    Types: Marijuana     Allergies   Robaxin [methocarbamol]; Shellfish-derived products; Trazodone and nefazodone; Contrast media [iodinated diagnostic agents]; and Reglan [metoclopramide]   Review of Systems Review of Systems  Constitutional: Negative for chills and fever.  HENT: Negative for ear pain and sore throat.   Eyes: Negative for pain and visual disturbance.  Respiratory: Negative for  cough and shortness of breath.   Cardiovascular: Negative for chest pain and palpitations.  Gastrointestinal: Positive for abdominal pain, diarrhea, nausea and vomiting.  Genitourinary: Negative for dysuria and hematuria.  Musculoskeletal: Negative for arthralgias and back pain.  Skin: Negative for color change and rash.  Neurological: Negative for seizures and syncope.  All other systems reviewed and are negative.    Physical Exam Updated Vital Signs BP (!) 150/88 (BP Location: Right Arm)   Pulse (!) 108   Temp 98 F (36.7 C) (Oral)   Resp 18   Ht 5\' 9"  (1.753 m)   Wt 63.5 kg (140 lb)   SpO2 99%   BMI 20.67 kg/m   Physical Exam  Constitutional: He appears well-developed and well-nourished. No distress.  HENT:  Head: Normocephalic and atraumatic.  Eyes: Conjunctivae are normal.  Neck: Neck supple.  Cardiovascular: Normal rate, regular rhythm and normal heart sounds.  Pulmonary/Chest: Effort normal. No respiratory distress. He has no wheezes. He has no rales.  Abdominal: Soft. Bowel sounds are normal. He exhibits no distension. There is tenderness in the epigastric area and left upper quadrant. There is guarding. There is no rigidity and no rebound.  Musculoskeletal: He exhibits no edema.  Neurological: He is alert.  Skin: Skin is warm and dry.  Nursing note and vitals reviewed.    ED Treatments / Results  Labs (all labs ordered are listed, but only abnormal results are displayed) Labs Reviewed  CBC WITH DIFFERENTIAL/PLATELET - Abnormal; Notable for the following components:      Result Value   WBC 14.8 (*)    RBC 3.48 (*)    Hemoglobin 9.7 (*)    HCT 29.6 (*)    RDW 16.2 (*)    All other components within normal limits  COMPREHENSIVE METABOLIC PANEL - Abnormal; Notable for the following components:   Potassium 3.1 (*)    CO2 21 (*)    Glucose, Bld 114 (*)    Calcium 8.7 (*)    Albumin 3.4 (*)    All other components within normal limits  LIPASE, BLOOD -  Abnormal; Notable for the following components:   Lipase 150 (*)    All other components within normal limits  ETHANOL - Abnormal; Notable for the following components:   Alcohol,  Ethyl (B) 78 (*)    All other components within normal limits    EKG  EKG Interpretation None       Radiology No results found.  Procedures Procedures (including critical care time)  Medications Ordered in ED Medications  gi cocktail (Maalox,Lidocaine,Donnatal) (30 mLs Oral Given 07/10/17 1940)  pantoprazole (PROTONIX) injection 40 mg (40 mg Intravenous Given 07/10/17 1940)  ketorolac (TORADOL) 30 MG/ML injection 30 mg (30 mg Intravenous Given 07/10/17 1940)  sodium chloride 0.9 % bolus 1,000 mL (0 mLs Intravenous Stopped 07/10/17 2123)  potassium chloride SA (K-DUR,KLOR-CON) CR tablet 40 mEq (40 mEq Oral Given 07/10/17 2122)  sucralfate (CARAFATE) tablet 1 g (1 g Oral Given 07/10/17 2122)  dicyclomine (BENTYL) injection 20 mg (20 mg Intramuscular Given 07/10/17 2122)  morphine 4 MG/ML injection 6 mg (6 mg Intravenous Given 07/10/17 2123)     Initial Impression / Assessment and Plan / ED Course  I have reviewed the triage vital signs and the nursing notes.  Pertinent labs & imaging results that were available during my care of the patient were reviewed by me and considered in my medical decision making (see chart for details).     Patient with multiple visits to the ED for similar symptoms in the past, here for acute on chronic pancreatitis.  He admits to drinking alcohol today, it is his birthday today, and he is actively vomiting.  I will try GI cocktail, Protonix, check lipase today.  9:10 PM WBC slighlty elevated today, lipase elevated at 150. Alcohol up as well consistent with his reported alcohol intake. Pt had no relief of pain with GI cocktail, protonix, toradol. GIven acute pancreatitis, most likely from his birthday drinks today, I think it is appropriate to hydrate with IV fluids and give a dose of  pain medications in ED. Pt will be given one dose of morphine, K replenished with 59mEq potassium PO, dose of bentyl given as well.   Vitals:   07/10/17 1523 07/10/17 1712 07/10/17 1913 07/10/17 2118  BP: (!) 148/94 (!) 150/88 (!) 144/92 (!) 138/92  Pulse:  (!) 108 96 82  Resp:  18 16 16   Temp:      TempSrc:      SpO2:  99% 99% 99%  Weight:      Height:         Final Clinical Impressions(s) / ED Diagnoses   Final diagnoses:  Alcohol-induced acute pancreatitis, unspecified complication status    ED Discharge Orders        Ordered    sucralfate (CARAFATE) 1 g tablet  3 times daily with meals & bedtime     07/10/17 2124       Jeannett Senior, PA-C 07/10/17 2126    Davonna Belling, MD 07/10/17 2300

## 2017-07-10 NOTE — Discharge Instructions (Signed)
Clear liquid diet. Avoid alcohol. Carafate for abdominal pain as prescribed. Follow up with your doctor.   Happy birthday!!!

## 2017-07-10 NOTE — ED Notes (Signed)
Pt verbalized understanding of discharge instructions. Pt unable to sign due to hall bed.

## 2017-07-10 NOTE — ED Notes (Signed)
Pt requesting something to help with "shaking"

## 2017-07-10 NOTE — ED Triage Notes (Signed)
Per [facility], pt here for abd pain. Pt has hx of chronic pancreatitis. Denies n/v. VS 148/86, HR 90, RR 18

## 2017-07-24 ENCOUNTER — Emergency Department (HOSPITAL_COMMUNITY)
Admission: EM | Admit: 2017-07-24 | Discharge: 2017-07-24 | Disposition: A | Discharge status: Home / Self Care | Payer: Self-pay | Attending: Emergency Medicine | Admitting: Emergency Medicine

## 2017-07-24 ENCOUNTER — Other Ambulatory Visit: Payer: Self-pay

## 2017-07-24 DIAGNOSIS — Z79899 Other long term (current) drug therapy: Secondary | ICD-10-CM | POA: Insufficient documentation

## 2017-07-24 DIAGNOSIS — R1013 Epigastric pain: Secondary | ICD-10-CM | POA: Insufficient documentation

## 2017-07-24 DIAGNOSIS — I10 Essential (primary) hypertension: Secondary | ICD-10-CM | POA: Insufficient documentation

## 2017-07-24 DIAGNOSIS — D573 Sickle-cell trait: Secondary | ICD-10-CM | POA: Insufficient documentation

## 2017-07-24 DIAGNOSIS — F17210 Nicotine dependence, cigarettes, uncomplicated: Secondary | ICD-10-CM | POA: Insufficient documentation

## 2017-07-24 LAB — CBC
HCT: 28.4 % — ABNORMAL LOW (ref 39.0–52.0)
HEMOGLOBIN: 9.1 g/dL — AB (ref 13.0–17.0)
MCH: 27.2 pg (ref 26.0–34.0)
MCHC: 32 g/dL (ref 30.0–36.0)
MCV: 85 fL (ref 78.0–100.0)
PLATELETS: 290 10*3/uL (ref 150–400)
RBC: 3.34 MIL/uL — AB (ref 4.22–5.81)
RDW: 16.3 % — ABNORMAL HIGH (ref 11.5–15.5)
WBC: 12.4 10*3/uL — AB (ref 4.0–10.5)

## 2017-07-24 LAB — COMPREHENSIVE METABOLIC PANEL
ALBUMIN: 3.6 g/dL (ref 3.5–5.0)
ALT: 19 U/L (ref 17–63)
ANION GAP: 9 (ref 5–15)
AST: 23 U/L (ref 15–41)
Alkaline Phosphatase: 80 U/L (ref 38–126)
BILIRUBIN TOTAL: 0.3 mg/dL (ref 0.3–1.2)
BUN: 15 mg/dL (ref 6–20)
CALCIUM: 8.9 mg/dL (ref 8.9–10.3)
CO2: 21 mmol/L — ABNORMAL LOW (ref 22–32)
Chloride: 109 mmol/L (ref 101–111)
Creatinine, Ser: 0.72 mg/dL (ref 0.61–1.24)
GFR calc non Af Amer: >60 mL/min (ref >60)
GLUCOSE: 91 mg/dL (ref 65–99)
POTASSIUM: 4.6 mmol/L (ref 3.5–5.1)
SODIUM: 139 mmol/L (ref 135–145)
TOTAL PROTEIN: 7.7 g/dL (ref 6.5–8.1)

## 2017-07-24 LAB — ETHANOL

## 2017-07-24 LAB — LIPASE, BLOOD: Lipase: 30 U/L (ref 11–51)

## 2017-07-24 MED ORDER — OXYCODONE-ACETAMINOPHEN 5-325 MG PO TABS
1.0000 | ORAL_TABLET | Freq: Once | ORAL | Status: AC
  Administered 2017-07-24: 1 via ORAL
  Filled 2017-07-24: qty 1

## 2017-07-24 MED ORDER — FAMOTIDINE 20 MG PO TABS
20.0000 mg | ORAL_TABLET | Freq: Once | ORAL | Status: AC
  Administered 2017-07-24: 20 mg via ORAL
  Filled 2017-07-24: qty 1

## 2017-07-24 MED ORDER — ONDANSETRON 8 MG PO TBDP
8.0000 mg | ORAL_TABLET | Freq: Once | ORAL | Status: AC
  Administered 2017-07-24: 8 mg via ORAL
  Filled 2017-07-24: qty 1

## 2017-07-24 MED ORDER — GI COCKTAIL ~~LOC~~
30.0000 mL | Freq: Once | ORAL | Status: AC
  Administered 2017-07-24: 30 mL via ORAL
  Filled 2017-07-24: qty 30

## 2017-07-24 NOTE — ED Notes (Signed)
Bed: WA21 Expected date:  Expected time:  Means of arrival:  Comments: 

## 2017-07-24 NOTE — ED Triage Notes (Signed)
Per EMS, patient comes from home. LLQ abdominal pain. Hx pancreatitis. Smells of ETOH. 18L FA. 200 fent given. 4 zofran. NSR on monitor. Pt has a care plan here.
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
## 2017-07-24 NOTE — Discharge Instructions (Signed)
It was our pleasure to provide your ER care today - we hope that you feel better.  Your lab tests appear consistent with your baseline, your pancreas test is normal today.  Continue your acid blocker therapy. You may take acetaminophen as need for pain.  Follow up with your doctor in the next couple days for recheck if symptoms fail to improve/resolve.  Return to ER if worse, new symptoms, fevers, persistent vomiting, other concern.  You were given pain medication in the ER - no driving for the next 4 hours.

## 2017-07-25 ENCOUNTER — Encounter (HOSPITAL_COMMUNITY): Payer: Self-pay | Admitting: *Deleted

## 2017-07-25 ENCOUNTER — Emergency Department (HOSPITAL_COMMUNITY)
Admission: EM | Admit: 2017-07-25 | Discharge: 2017-07-26 | Disposition: A | Discharge status: Home / Self Care | Payer: Self-pay | Attending: Emergency Medicine | Admitting: Emergency Medicine

## 2017-07-25 DIAGNOSIS — J45909 Unspecified asthma, uncomplicated: Secondary | ICD-10-CM | POA: Insufficient documentation

## 2017-07-25 DIAGNOSIS — Z79899 Other long term (current) drug therapy: Secondary | ICD-10-CM | POA: Insufficient documentation

## 2017-07-25 DIAGNOSIS — R109 Unspecified abdominal pain: Secondary | ICD-10-CM

## 2017-07-25 DIAGNOSIS — G8929 Other chronic pain: Secondary | ICD-10-CM | POA: Insufficient documentation

## 2017-07-25 DIAGNOSIS — F17210 Nicotine dependence, cigarettes, uncomplicated: Secondary | ICD-10-CM | POA: Insufficient documentation

## 2017-07-25 DIAGNOSIS — R1013 Epigastric pain: Secondary | ICD-10-CM | POA: Insufficient documentation

## 2017-07-25 DIAGNOSIS — I10 Essential (primary) hypertension: Secondary | ICD-10-CM | POA: Insufficient documentation

## 2017-07-25 DIAGNOSIS — R112 Nausea with vomiting, unspecified: Secondary | ICD-10-CM | POA: Insufficient documentation

## 2017-07-25 LAB — URINALYSIS, ROUTINE W REFLEX MICROSCOPIC
Bilirubin Urine: NEGATIVE
Glucose, UA: NEGATIVE mg/dL
Hgb urine dipstick: NEGATIVE
Ketones, ur: NEGATIVE mg/dL
Leukocytes, UA: NEGATIVE
Nitrite: NEGATIVE
Protein, ur: NEGATIVE mg/dL
Specific Gravity, Urine: 1.026 (ref 1.005–1.030)
pH: 5 (ref 5.0–8.0)

## 2017-07-25 LAB — COMPREHENSIVE METABOLIC PANEL
ALT: 17 U/L (ref 17–63)
AST: 31 U/L (ref 15–41)
Albumin: 3.3 g/dL — ABNORMAL LOW (ref 3.5–5.0)
Alkaline Phosphatase: 80 U/L (ref 38–126)
Anion gap: 9 (ref 5–15)
BUN: 9 mg/dL (ref 6–20)
CO2: 23 mmol/L (ref 22–32)
Calcium: 8.6 mg/dL — ABNORMAL LOW (ref 8.9–10.3)
Chloride: 107 mmol/L (ref 101–111)
Creatinine, Ser: 0.87 mg/dL (ref 0.61–1.24)
GFR calc Af Amer: >60 mL/min (ref >60)
GFR calc non Af Amer: >60 mL/min (ref >60)
Glucose, Bld: 90 mg/dL (ref 65–99)
Potassium: 4.2 mmol/L (ref 3.5–5.1)
Sodium: 139 mmol/L (ref 135–145)
Total Bilirubin: 0.3 mg/dL (ref 0.3–1.2)
Total Protein: 7 g/dL (ref 6.5–8.1)

## 2017-07-25 LAB — CBC
HCT: 26.8 % — ABNORMAL LOW (ref 39.0–52.0)
Hemoglobin: 8.7 g/dL — ABNORMAL LOW (ref 13.0–17.0)
MCH: 27.2 pg (ref 26.0–34.0)
MCHC: 32.5 g/dL (ref 30.0–36.0)
MCV: 83.8 fL (ref 78.0–100.0)
Platelets: 287 10*3/uL (ref 150–400)
RBC: 3.2 MIL/uL — ABNORMAL LOW (ref 4.22–5.81)
RDW: 16.3 % — ABNORMAL HIGH (ref 11.5–15.5)
WBC: 8.5 10*3/uL (ref 4.0–10.5)

## 2017-07-25 LAB — LIPASE, BLOOD: Lipase: 66 U/L — ABNORMAL HIGH (ref 11–51)

## 2017-07-25 MED ORDER — ONDANSETRON 4 MG PO TBDP
4.0000 mg | ORAL_TABLET | Freq: Once | ORAL | Status: AC
  Administered 2017-07-25: 4 mg via ORAL
  Filled 2017-07-25: qty 1

## 2017-07-25 MED ORDER — IBUPROFEN 400 MG PO TABS
400.0000 mg | ORAL_TABLET | Freq: Once | ORAL | Status: AC | PRN
  Administered 2017-07-25: 400 mg via ORAL
  Filled 2017-07-25: qty 1

## 2017-07-25 NOTE — ED Provider Notes (Signed)
Patient placed in Quick Look pathway, seen and evaluated   Chief Complaint: abdominal pain  HPI:   47 year old male with a h/o of chronic abdominal pain presenting with LLQ abdominal pain, onset yesterday with N/V/D. Last epiesode of NBNB emesis earlier today. No fever or chills. He also states "I'm seeing little wormy, wormy spots from not drinking in a few days." Last Etoh 3 days ago. Denies a h/o of seizures or DT. He denies fever, chills, melena, hematochezia, or dysuria.   ROS: abdominal pain, nausea, vomiting, diarrhea  Physical Exam:   Gen: No distress  Neuro: Awake and Alert  Skin: Warm    Focused Exam: Abdomen is soft, non-distended. Mild LLQ, LUQ, and RUQ TTP. No RLQ tenderness. No peritoneal signs. No CVA tenderness bilaterally. Heart is RRR. No murmurs, rubs, or gallops.   Initiation of care has begun. The patient has been counseled on the process, plan, and necessity for staying for the completion/evaluation, and the remainder of the medical screening examination    Joanne Gavel, PA-C 07/25/17 2010    Virgel Manifold, MD 07/26/17 1526

## 2017-07-25 NOTE — ED Triage Notes (Signed)
BIB EMS from home, reports LLQ abd pain present since yesterday. Pt has hx of pancreatitis, seen at [facility] yesterday for same.

## 2017-07-26 MED ORDER — GI COCKTAIL ~~LOC~~
30.0000 mL | Freq: Once | ORAL | Status: AC
  Administered 2017-07-26: 30 mL via ORAL
  Filled 2017-07-26: qty 30

## 2017-07-26 MED ORDER — KETOROLAC TROMETHAMINE 60 MG/2ML IM SOLN
60.0000 mg | Freq: Once | INTRAMUSCULAR | Status: AC
  Administered 2017-07-26: 60 mg via INTRAMUSCULAR
  Filled 2017-07-26: qty 2

## 2017-07-26 MED ORDER — ONDANSETRON 4 MG PO TBDP
4.0000 mg | ORAL_TABLET | Freq: Once | ORAL | Status: AC
Start: 2017-07-26 — End: 2017-07-26
  Administered 2017-07-26: 4 mg via ORAL
  Filled 2017-07-26: qty 1

## 2017-07-26 NOTE — Discharge Instructions (Signed)
Continue your home medications as prescribed. Follow-up with your primary care doctor. Return to the ED for new or worsening symptoms.

## 2017-07-26 NOTE — ED Provider Notes (Signed)
[facility] EMERGENCY DEPARTMENT Provider Note   CSN: [id number] Arrival date & time: 07/25/17  1905     History   Chief Complaint Chief Complaint  Patient presents with  . Abdominal Pain    HPI [name] is a 47 y.o. male.  The history is provided by the patient and medical records.  Abdominal Pain   Associated symptoms include nausea and vomiting.    47 year old male with history of alcohol abuse, anemia, anxiety, bipolar disorder, polysubstance abuse, GERD, hyperlipidemia, hypertension, chronic abdominal pain, chronic pancreatitis, presenting to the ED with abdominal pain.  Patient is well-known to this ED for similar related complaints, this is his 27th ED visit in the past 6 months.  Reports epigastric abdominal pain, worse over the past 24 hours.  Denies any recent alcohol use in the past 2 days.  He reports some nausea and vomiting today.  This was nonbloody and nonbilious.  He tried to eat some [place] chunky chicken noodle soup and crackers but states it is "not staying down".  No fever, chills, sweats.  States he has been complaint with home meds.  Past Medical History:  Diagnosis Date  . Alcoholism /alcohol abuse ([place])   . Anemia   . Anxiety   . Arthritis    "knees; arms; elbows" (03/26/2015)  . Asthma   . Bipolar disorder ([employer])   . Chronic bronchitis ([place])   . Chronic lower back pain   . Cocaine abuse ([place])   . Depression   . Family history of adverse reaction to anesthesia    "grandmother gets confused"  . Femoral condyle fracture ([place]) 03/08/2014   left medial/notes 03/09/2014  . GERD (gastroesophageal reflux disease)   . H/O hiatal hernia   . H/O suicide attempt 10/2012  . Heart murmur    "when he was little" (03/06/2013)  . High cholesterol   . History of blood transfusion 10/2012   "when I tried to commit suicide"  . History of stomach ulcers   . Hypertension   . Marijuana abuse, continuous   . Migraine    "a few  times/year" (03/26/2015)  . Pancreatitis   . Pneumonia 1990's X 3  . PTSD (post-traumatic stress disorder)   . Shortness of breath    "can happen at anytime" (03/06/2013)  . Sickle cell trait ([place])   . WPW (Wolff-Parkinson-White syndrome)    [name] 03/06/2013    Patient Active Problem List   Diagnosis Date Noted  . Abdominal pain 05/27/2017  . Hematemesis 05/27/2017  . Tachycardia 03/18/2017  . Nausea & vomiting 03/18/2017  . Diarrhea 03/18/2017  . Acute on chronic pancreatitis ([place]) 12/17/2016  . Chronic alcoholic pancreatitis ([place]) 12/05/2016  . Intractable nausea and vomiting 12/05/2016  . Verbally abusive behavior 12/05/2016  . Normocytic anemia 12/05/2016  . Alcohol use disorder, severe, dependence ([place]) 07/25/2016  . Cocaine use disorder, severe, dependence ([place]) 07/25/2016  . Major depressive disorder, recurrent severe without psychotic features ([place]) 07/20/2016  . Leukocytosis   . Hospital acquired PNA 05/20/2015  . Pancreatitis 05/18/2015  . Pseudocyst of pancreas 05/18/2015  . Polysubstance abuse (tobacco, cocaine, THC, and ETOH) 03/26/2015  . Alcohol-induced chronic pancreatitis ([place])   . Benign essential HTN 02/06/2014  . Alcohol-induced acute pancreatitis 11/28/2013  . Pancreatic pseudocyst/cyst 11/25/2013  . MDD (major depressive disorder), recurrent severe, without psychosis ([place]) 10/22/2013  . Severe protein-calorie malnutrition ([place]) 10/10/2013  . Suicide attempt ([place]) 10/08/2013  . 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  TOBACCO ABUSE 03/23/2007    Past Surgical History:  Procedure Laterality Date  . CARDIAC CATHETERIZATION    . EYE SURGERY Left 1990's   "result of trauma"   . FACIAL FRACTURE SURGERY Left 1990's   "result of trauma"   . FRACTURE SURGERY    . HERNIA REPAIR    . LEFT HEART CATHETERIZATION WITH CORONARY ANGIOGRAM Right 03/07/2013   Procedure: LEFT HEART CATHETERIZATION WITH CORONARY ANGIOGRAM;  Surgeon:  [name], MD;  Location: [facility]ATH [facility];  Service: Cardiovascular;  Laterality: Right;  . UMBILICAL HERNIA REPAIR         Home Medications    Prior to Admission medications   Medication Sig Start Date End Date Taking? Authorizing Provider  albuterol (PROVENTIL HFA;VENTOLIN HFA) 108 (90 Base) MCG/ACT inhaler Inhale 2 puffs into the lungs every 6 (six) hours as needed for wheezing or shortness of breath. 07/25/16   Lindell Spar I, NP  dicyclomine (BENTYL) 20 MG tablet Take 1 tablet (20 mg total) by mouth 2 (two) times daily. 06/14/17   Palumbo, April, MD  famotidine (PEPCID) 20 MG tablet Take 1 tablet (20 mg total) by mouth 2 (two) times daily. 07/05/17   Sherwood Gambler, MD  gabapentin (NEURONTIN) 300 MG capsule Take 1 capsule (300 mg total) by mouth 3 (three) times daily. For agitation Patient taking differently: Take 300 mg by mouth 2 (two) times daily.  07/25/16   Lindell Spar I, NP  hydrALAZINE (APRESOLINE) 25 MG tablet Take 1 tablet (25 mg total) by mouth every 8 (eight) hours. Patient taking differently: Take 25 mg by mouth at bedtime.  12/29/16   Barton Dubois, MD  lipase/protease/amylase (CREON) 12000 units CPEP capsule Take 2 capsules (24,000 Units total) by mouth 3 (three) times daily with meals. For pancreatitis 06/06/17   [name], MD  loratadine (CLARITIN) 10 MG tablet Take 1 tablet (10 mg total) by mouth daily. (May purchase from over the counter): For allergies 07/26/16   Lindell Spar I, NP  Menthol, Topical Analgesic, (BENGAY EX) Apply 1 application topically 3 (three) times daily as needed (joint pain).    [provider]  metoprolol tartrate (LOPRESSOR) 25 MG tablet Take 1 tablet (25 mg total) by mouth 2 (two) times daily. For high blood pressure 12/29/16   Barton Dubois, MD  ondansetron (ZOFRAN) 4 MG tablet Take 1 tablet (4 mg total) by mouth every 6 (six) hours. 07/06/17   Hedges, Dellis Filbert, PA-C  oxyCODONE (ROXICODONE) 5 MG immediate release tablet Take 1 tablet (5  mg total) by mouth every 8 (eight) hours as needed. 05/30/17 05/30/18  Georgette Shell, MD  pantoprazole (PROTONIX) 40 MG tablet Take 1 tablet (40 mg total) by mouth daily. 06/09/17   [name], MD  Polyethyl Glycol-Propyl Glycol (SYSTANE OP) Place 1 drop into both eyes daily.    [provider]  sertraline (ZOLOFT) 25 MG tablet Take 3 tablets (75 mg total) by mouth daily. For depression 07/26/16   Lindell Spar I, NP  sucralfate (CARAFATE) 1 g tablet Take 1 tablet (1 g total) by mouth 4 (four) times daily -  with meals and at bedtime. 07/10/17   Kirichenko, Lahoma Rocker, PA-C  thiamine 100 MG tablet Take 1 tablet (100 mg total) by mouth daily. 12/30/16   Barton Dubois, MD  omeprazole (PRILOSEC) 20 MG capsule Take 1 capsule (20 mg total) daily by mouth. 03/22/17 06/21/17  Varney Biles, MD    Family History Family History  Problem Relation Age of Onset  .  Hypertension Mother   . Cirrhosis Mother   . Alcoholism Mother   . Hypertension Father   . Melanoma Father   . Hypertension Other   . Coronary artery disease Other     Social History Social History   Tobacco Use  . Smoking status: Current Every Day Smoker    Packs/day: 1.00    Types: Cigarettes, E-cigarettes  . Smokeless tobacco: Never Used  Substance Use Topics  . Alcohol use: Yes    Comment: last drink 07/01/17  . Drug use: Yes    Types: Marijuana     Allergies   Robaxin [methocarbamol]; Shellfish-derived products; Trazodone and nefazodone; Contrast media [iodinated diagnostic agents]; and Reglan [metoclopramide]   Review of Systems Review of Systems  Gastrointestinal: Positive for abdominal pain, nausea and vomiting.  All other systems reviewed and are negative.    Physical Exam Updated Vital Signs BP (!) 146/77 (BP Location: Right Arm)   Pulse 83   Temp 98.4 F (36.9 C) (Oral)   Resp 14   Ht 5\' 9"  (1.753 m)   Wt 63.5 kg (140 lb)   SpO2 100%   BMI 20.67 kg/m   Physical Exam  Constitutional: He is  oriented to person, place, and time. He appears well-developed and well-nourished.  HENT:  Head: Normocephalic and atraumatic.  Mouth/Throat: Oropharynx is clear and moist.  Eyes: Conjunctivae and EOM are normal. Pupils are equal, round, and reactive to light.  Neck: Normal range of motion.  Cardiovascular: Normal rate, regular rhythm and normal heart sounds.  Pulmonary/Chest: Effort normal and breath sounds normal.  Abdominal: Soft. Bowel sounds are normal. There is no tenderness. There is no rigidity and no guarding.  Wincing with palpation when watching exam, however when distracted no tenderness illicited  Musculoskeletal: Normal range of motion.  Neurological: He is alert and oriented to person, place, and time.  Skin: Skin is warm and dry.  Psychiatric: He has a normal mood and affect.  Nursing note and vitals reviewed.    ED Treatments / Results  Labs (all labs ordered are listed, but only abnormal results are displayed) Labs Reviewed  LIPASE, BLOOD - Abnormal; Notable for the following components:      Result Value   Lipase 66 (*)    All other components within normal limits  COMPREHENSIVE METABOLIC PANEL - Abnormal; Notable for the following components:   Calcium 8.6 (*)    Albumin 3.3 (*)    All other components within normal limits  CBC - Abnormal; Notable for the following components:   RBC 3.20 (*)    Hemoglobin 8.7 (*)    HCT 26.8 (*)    RDW 16.3 (*)    All other components within normal limits  URINALYSIS, ROUTINE W REFLEX MICROSCOPIC    EKG  EKG Interpretation None       Radiology No results found.  Procedures Procedures (including critical care time)  Medications Ordered in ED Medications  ibuprofen (ADVIL,MOTRIN) tablet 400 mg (400 mg Oral Given 07/25/17 1917)  ondansetron (ZOFRAN-ODT) disintegrating tablet 4 mg (4 mg Oral Given 07/25/17 1917)  gi cocktail (Maalox,Lidocaine,Donnatal) (30 mLs Oral Given 07/26/17 0322)  ondansetron (ZOFRAN-ODT)  disintegrating tablet 4 mg (4 mg Oral Given 07/26/17 0322)  ketorolac (TORADOL) injection 60 mg (60 mg Intramuscular Given 07/26/17 0322)     Initial Impression / Assessment and Plan / ED Course  I have reviewed the triage vital signs and the nursing notes.  Pertinent labs & imaging results that were available during  my care of the patient were reviewed by me and considered in my medical decision making (see chart for details).  47 year old male presenting to the ED with abdominal pain.  He is well-known to this ED for similar related complaints.  He is afebrile and nontoxic.  Screening labs from triage appear around his baseline.  On exam he has some tenderness in the epigastrium when watching me examine him, however when distracted he has no tenderness.  I have very low suspicion for acute/surgical abdominal pathology at this time.  Will treat symptomatically here with nonnarcotic medications, I have discussed my reasoning for this with him.  I have encouraged him to follow-up closely with his primary care doctor.   Discussed plan with patient, he acknowledged understanding and agreed with plan of care.  Return precautions given for new or worsening symptoms.  Final Clinical Impressions(s) / ED Diagnoses   Final diagnoses:  Chronic abdominal pain    ED Discharge Orders    None       Larene Pickett, PA-C 07/26/17 0541    Merryl Hacker, MD 07/26/17 [phone]

## 2017-07-26 NOTE — ED Notes (Signed)
Esignature pad not available; pt verbalized understanding

## 2017-07-26 NOTE — ED Notes (Signed)
Patient requesting medications to keep him from "shaking".

## 2017-08-02 ENCOUNTER — Emergency Department (HOSPITAL_COMMUNITY)
Admission: EM | Admit: 2017-08-02 | Discharge: 2017-08-02 | Disposition: A | Discharge status: Home / Self Care | Payer: Self-pay | Attending: Emergency Medicine | Admitting: Emergency Medicine

## 2017-08-02 ENCOUNTER — Encounter (HOSPITAL_COMMUNITY): Payer: Self-pay | Admitting: *Deleted

## 2017-08-02 ENCOUNTER — Other Ambulatory Visit: Payer: Self-pay

## 2017-08-02 DIAGNOSIS — Z79899 Other long term (current) drug therapy: Secondary | ICD-10-CM | POA: Insufficient documentation

## 2017-08-02 DIAGNOSIS — R112 Nausea with vomiting, unspecified: Secondary | ICD-10-CM

## 2017-08-02 DIAGNOSIS — R1084 Generalized abdominal pain: Secondary | ICD-10-CM

## 2017-08-02 DIAGNOSIS — J45909 Unspecified asthma, uncomplicated: Secondary | ICD-10-CM | POA: Insufficient documentation

## 2017-08-02 DIAGNOSIS — F17210 Nicotine dependence, cigarettes, uncomplicated: Secondary | ICD-10-CM | POA: Insufficient documentation

## 2017-08-02 DIAGNOSIS — I10 Essential (primary) hypertension: Secondary | ICD-10-CM | POA: Insufficient documentation

## 2017-08-02 LAB — CBC
HCT: 28.3 % — ABNORMAL LOW (ref 39.0–52.0)
Hemoglobin: 8.6 g/dL — ABNORMAL LOW (ref 13.0–17.0)
MCH: 25.5 pg — AB (ref 26.0–34.0)
MCHC: 30.4 g/dL (ref 30.0–36.0)
MCV: 84 fL (ref 78.0–100.0)
PLATELETS: 347 10*3/uL (ref 150–400)
RBC: 3.37 MIL/uL — ABNORMAL LOW (ref 4.22–5.81)
RDW: 16 % — AB (ref 11.5–15.5)
WBC: 9.7 10*3/uL (ref 4.0–10.5)

## 2017-08-02 LAB — COMPREHENSIVE METABOLIC PANEL
ALBUMIN: 3.3 g/dL — AB (ref 3.5–5.0)
ALT: 18 U/L (ref 17–63)
AST: 42 U/L — ABNORMAL HIGH (ref 15–41)
Alkaline Phosphatase: 76 U/L (ref 38–126)
Anion gap: 9 (ref 5–15)
BUN: 13 mg/dL (ref 6–20)
CALCIUM: 8.6 mg/dL — AB (ref 8.9–10.3)
CO2: 23 mmol/L (ref 22–32)
CREATININE: 0.67 mg/dL (ref 0.61–1.24)
Chloride: 107 mmol/L (ref 101–111)
GFR calc Af Amer: >60 mL/min (ref >60)
GFR calc non Af Amer: >60 mL/min (ref >60)
GLUCOSE: 102 mg/dL — AB (ref 65–99)
Potassium: 4 mmol/L (ref 3.5–5.1)
SODIUM: 139 mmol/L (ref 135–145)
Total Bilirubin: 0.3 mg/dL (ref 0.3–1.2)
Total Protein: 6.2 g/dL — ABNORMAL LOW (ref 6.5–8.1)

## 2017-08-02 LAB — LIPASE, BLOOD: Lipase: 34 U/L (ref 11–51)

## 2017-08-02 MED ORDER — LORAZEPAM 2 MG/ML IJ SOLN
1.0000 mg | Freq: Once | INTRAMUSCULAR | Status: AC
  Administered 2017-08-02: 1 mg via INTRAVENOUS
  Filled 2017-08-02: qty 1

## 2017-08-02 MED ORDER — SODIUM CHLORIDE 0.9 % IV BOLUS
1000.0000 mL | Freq: Once | INTRAVENOUS | Status: AC
  Administered 2017-08-02: 1000 mL via INTRAVENOUS

## 2017-08-02 MED ORDER — ONDANSETRON 4 MG PO TBDP
4.0000 mg | ORAL_TABLET | Freq: Once | ORAL | Status: AC
  Administered 2017-08-02: 4 mg via ORAL
  Filled 2017-08-02: qty 1

## 2017-08-02 MED ORDER — GI COCKTAIL ~~LOC~~
30.0000 mL | Freq: Once | ORAL | Status: AC
  Administered 2017-08-02: 30 mL via ORAL
  Filled 2017-08-02: qty 30

## 2017-08-02 MED ORDER — ONDANSETRON 8 MG PO TBDP: 8.0000 mg | ORAL_TABLET | Freq: Three times a day (TID) | ORAL | 0 refills | Status: DC | PRN

## 2017-08-02 NOTE — ED Provider Notes (Signed)
[facility] EMERGENCY DEPARTMENT Provider Note   CSN: [id number] Arrival date & time: 08/02/17  9935     History   Chief Complaint Chief Complaint  Patient presents with  . Abdominal Pain    HPI [name] is a 47 y.o. male.  HPI  47 y.o male with chronic abdominal pain, etoh abuse, ongoing alcohol abuse presents stating pain worse at 0300 and awoke from sleep.  States was hurting loc and took advil, tylenol, and carafate, protonix but worse at 0300 with burning and stabbing with nausea and vomiting x 3 nbnb after eating soup and crackers. Denies fever, c hills, some sweating and shakes.  No alcohol x 3 days but states tried to drink beer Monday but vomited and smoked marijuana.  Feels "a little jittery and sees squiggly lines"  Past Medical History:  Diagnosis Date  . Alcoholism /alcohol abuse ([place])   . Anemia   . Anxiety   . Arthritis    "knees; arms; elbows" (03/26/2015)  . Asthma   . Bipolar disorder ([place])   . Chronic bronchitis ([place])   . Chronic lower back pain   . Cocaine abuse ([place])   . Depression   . Family history of adverse reaction to anesthesia    "grandmother gets confused"  . Femoral condyle fracture ([place]) 03/08/2014   left medial/notes 03/09/2014  . GERD (gastroesophageal reflux disease)   . H/O hiatal hernia   . H/O suicide attempt 10/2012  . Heart murmur    "when he was little" (03/06/2013)  . High cholesterol   . History of blood transfusion 10/2012   "when I tried to commit suicide"  . History of stomach ulcers   . Hypertension   . Marijuana abuse, continuous   . Migraine    "a few times/year" (03/26/2015)  . Pancreatitis   . Pneumonia 1990's X 3  . PTSD (post-traumatic stress disorder)   . Shortness of breath    "can happen at anytime" (03/06/2013)  . Sickle cell trait ([place])   . WPW (Wolff-Parkinson-White syndrome)    [name] 03/06/2013    Patient Active Problem List   Diagnosis Date Noted  . Abdominal pain  05/27/2017  . Hematemesis 05/27/2017  . Tachycardia 03/18/2017  . Nausea & vomiting 03/18/2017  . Diarrhea 03/18/2017  . Acute on chronic pancreatitis ([place]) 12/17/2016  . Chronic alcoholic pancreatitis ([place]) 12/05/2016  . Intractable nausea and vomiting 12/05/2016  . Verbally abusive behavior 12/05/2016  . Normocytic anemia 12/05/2016  . Alcohol use disorder, severe, dependence ([place]) 07/25/2016  . Cocaine use disorder, severe, dependence ([place]) 07/25/2016  . Major depressive disorder, recurrent severe without psychotic features ([place]) 07/20/2016  . Leukocytosis   . Hospital acquired PNA 05/20/2015  . Pancreatitis 05/18/2015  . Pseudocyst of pancreas 05/18/2015  . Polysubstance abuse (tobacco, cocaine, THC, and ETOH) 03/26/2015  . Alcohol-induced chronic pancreatitis ([place])   . Benign essential HTN 02/06/2014  . Alcohol-induced acute pancreatitis 11/28/2013  . Pancreatic pseudocyst/cyst 11/25/2013  . MDD (major depressive disorder), recurrent severe, without psychosis ([place]) 10/22/2013  . Severe protein-calorie malnutrition ([place]) 10/10/2013  . Suicide attempt ([place]) 10/08/2013  . 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 TOBACCO ABUSE 03/23/2007    Past Surgical History:  Procedure Laterality Date  . CARDIAC CATHETERIZATION    . EYE SURGERY Left 1990's   "result of trauma"   . FACIAL FRACTURE SURGERY Left 1990's   "result of trauma"   . FRACTURE SURGERY    .  HERNIA REPAIR    . LEFT HEART CATHETERIZATION WITH CORONARY ANGIOGRAM Right 03/07/2013   Procedure: LEFT HEART CATHETERIZATION WITH CORONARY ANGIOGRAM;  Surgeon: [name], MD;  Location: [facility]ATH [facility];  Service: Cardiovascular;  Laterality: Right;  . UMBILICAL HERNIA REPAIR          Home Medications    Prior to Admission medications   Medication Sig Start Date End Date Taking? Authorizing Provider  famotidine (PEPCID) 20 MG tablet Take 1 tablet (20 mg total) by mouth 2 (two) times daily.  07/05/17  Yes Sherwood Gambler, MD  hydrALAZINE (APRESOLINE) 25 MG tablet Take 1 tablet (25 mg total) by mouth every 8 (eight) hours. Patient taking differently: Take 25 mg by mouth at bedtime.  12/29/16  Yes Barton Dubois, MD  lipase/protease/amylase (CREON) 12000 units CPEP capsule Take 2 capsules (24,000 Units total) by mouth 3 (three) times daily with meals. For pancreatitis 06/06/17  Yes [name], MD  loratadine (CLARITIN) 10 MG tablet Take 1 tablet (10 mg total) by mouth daily. (May purchase from over the counter): For allergies 07/26/16  Yes Lindell Spar I, NP  metoprolol tartrate (LOPRESSOR) 25 MG tablet Take 1 tablet (25 mg total) by mouth 2 (two) times daily. For high blood pressure 12/29/16  Yes Barton Dubois, MD  pantoprazole (PROTONIX) 40 MG tablet Take 1 tablet (40 mg total) by mouth daily. 06/09/17  Yes [name], MD  sertraline (ZOLOFT) 25 MG tablet Take 3 tablets (75 mg total) by mouth daily. For depression 07/26/16  Yes Nwoko, Herbert Pun I, NP  sucralfate (CARAFATE) 1 g tablet Take 1 tablet (1 g total) by mouth 4 (four) times daily -  with meals and at bedtime. 07/10/17  Yes Kirichenko, Tatyana, PA-C  albuterol (PROVENTIL HFA;VENTOLIN HFA) 108 (90 Base) MCG/ACT inhaler Inhale 2 puffs into the lungs every 6 (six) hours as needed for wheezing or shortness of breath. 07/25/16   Lindell Spar I, NP  omeprazole (PRILOSEC) 20 MG capsule Take 1 capsule (20 mg total) daily by mouth. 03/22/17 06/21/17  Varney Biles, MD    Family History Family History  Problem Relation Age of Onset  . Hypertension Mother   . Cirrhosis Mother   . Alcoholism Mother   . Hypertension Father   . Melanoma Father   . Hypertension Other   . Coronary artery disease Other     Social History Social History   Tobacco Use  . Smoking status: Current Every Day Smoker    Packs/day: 1.00    Types: Cigarettes, E-cigarettes  . Smokeless tobacco: Never Used  Substance Use Topics  . Alcohol use: Yes    Comment:  last drink 07/01/17  . Drug use: Yes    Types: Marijuana     Allergies   Robaxin [methocarbamol]; Shellfish-derived products; Trazodone and nefazodone; Contrast media [iodinated diagnostic agents]; and Reglan [metoclopramide]   Review of Systems Review of Systems  All other systems reviewed and are negative.    Physical Exam Updated Vital Signs BP (!) 147/99   Pulse 86   Temp 98.1 F (36.7 C) (Oral)   Resp 18   SpO2 100%   Physical Exam  Constitutional: He is oriented to person, place, and time. He appears well-developed and well-nourished.  HENT:  Head: Normocephalic and atraumatic.  Mouth/Throat: Oropharynx is clear and moist.  Eyes: Pupils are equal, round, and reactive to light. EOM are normal.  Cardiovascular: Normal rate and regular rhythm.  Pulmonary/Chest: Effort normal and breath sounds normal.  Abdominal: Bowel  sounds are normal. There is generalized tenderness.  Neurological: He is alert and oriented to person, place, and time.  Skin: Skin is warm and dry. Capillary refill takes less than 2 seconds.  Psychiatric: He has a normal mood and affect. His behavior is normal.  Nursing note and vitals reviewed.    ED Treatments / Results  Labs (all labs ordered are listed, but only abnormal results are displayed) Labs Reviewed  COMPREHENSIVE METABOLIC PANEL - Abnormal; Notable for the following components:      Result Value   Glucose, Bld 102 (*)    Calcium 8.6 (*)    Total Protein 6.2 (*)    Albumin 3.3 (*)    AST 42 (*)    All other components within normal limits  CBC - Abnormal; Notable for the following components:   RBC 3.37 (*)    Hemoglobin 8.6 (*)    HCT 28.3 (*)    MCH 25.5 (*)    RDW 16.0 (*)    All other components within normal limits  LIPASE, BLOOD    EKG None  Radiology No results found.  Procedures Procedures (including critical care time)  Medications Ordered in ED Medications - No data to display   Initial Impression /  Assessment and Plan / ED Course  I have reviewed the triage vital signs and the nursing notes.  Pertinent labs & imaging results that were available during my care of the patient were reviewed by me and considered in my medical decision making (see chart for details).    1:54 PM Fluids infusing.  Patient resting comfortably.  Taking sips of water.  Anticipate discharge in IV fluids completed  Final Clinical Impressions(s) / ED Diagnoses   Final diagnoses:  Generalized abdominal pain  Nausea and vomiting, intractability of vomiting not specified, unspecified vomiting type    ED Discharge Orders    None       Pattricia Boss, MD 08/03/17 1434

## 2017-08-02 NOTE — ED Triage Notes (Signed)
Pt BIB [facility] for abd pain since 3am with nv; hx of pancreatitis

## 2017-08-02 NOTE — Discharge Planning (Signed)
EDCM noted that patient has had 25 ED visit in the past 6 months patient was in the ED last night with similar complaints.  Patient is being followed by Marliss Coots, NP at the [facility] where he also receives medication assistance. When medically cleared patient should folllow up at the [facility] and see Marliss Coots, NP Monday thru Friday 8- 11am.

## 2017-08-02 NOTE — ED Notes (Signed)
Cup of water provided to patient for fluid challenge.  

## 2017-08-07 ENCOUNTER — Encounter (HOSPITAL_COMMUNITY): Payer: Self-pay | Admitting: *Deleted

## 2017-08-07 ENCOUNTER — Encounter (HOSPITAL_COMMUNITY): Payer: Self-pay

## 2017-08-07 ENCOUNTER — Other Ambulatory Visit: Payer: Self-pay

## 2017-08-07 ENCOUNTER — Emergency Department (HOSPITAL_COMMUNITY)
Admission: EM | Admit: 2017-08-07 | Discharge: 2017-08-07 | Disposition: A | Discharge status: Home / Self Care | Payer: Self-pay | Attending: Emergency Medicine | Admitting: Emergency Medicine

## 2017-08-07 ENCOUNTER — Emergency Department (HOSPITAL_COMMUNITY): Admission: EM | Admit: 2017-08-07 | Discharge: 2017-08-07 | Discharge status: Against Medical Advice | Payer: Self-pay

## 2017-08-07 DIAGNOSIS — J45909 Unspecified asthma, uncomplicated: Secondary | ICD-10-CM | POA: Insufficient documentation

## 2017-08-07 DIAGNOSIS — F17210 Nicotine dependence, cigarettes, uncomplicated: Secondary | ICD-10-CM | POA: Insufficient documentation

## 2017-08-07 DIAGNOSIS — Z79899 Other long term (current) drug therapy: Secondary | ICD-10-CM | POA: Insufficient documentation

## 2017-08-07 DIAGNOSIS — R109 Unspecified abdominal pain: Secondary | ICD-10-CM | POA: Insufficient documentation

## 2017-08-07 DIAGNOSIS — I10 Essential (primary) hypertension: Secondary | ICD-10-CM | POA: Insufficient documentation

## 2017-08-07 DIAGNOSIS — Z5321 Procedure and treatment not carried out due to patient leaving prior to being seen by health care provider: Secondary | ICD-10-CM | POA: Insufficient documentation

## 2017-08-07 DIAGNOSIS — R1013 Epigastric pain: Secondary | ICD-10-CM | POA: Insufficient documentation

## 2017-08-07 LAB — CBC
HCT: 28.5 % — ABNORMAL LOW (ref 39.0–52.0)
Hemoglobin: 8.9 g/dL — ABNORMAL LOW (ref 13.0–17.0)
MCH: 26.2 pg (ref 26.0–34.0)
MCHC: 31.2 g/dL (ref 30.0–36.0)
MCV: 83.8 fL (ref 78.0–100.0)
PLATELETS: 303 10*3/uL (ref 150–400)
RBC: 3.4 MIL/uL — ABNORMAL LOW (ref 4.22–5.81)
RDW: 16.1 % — AB (ref 11.5–15.5)
WBC: 13.5 10*3/uL — AB (ref 4.0–10.5)

## 2017-08-07 LAB — COMPREHENSIVE METABOLIC PANEL
ALBUMIN: 3.7 g/dL (ref 3.5–5.0)
ALT: 23 U/L (ref 17–63)
ANION GAP: 9 (ref 5–15)
AST: 41 U/L (ref 15–41)
Alkaline Phosphatase: 80 U/L (ref 38–126)
BILIRUBIN TOTAL: 0.7 mg/dL (ref 0.3–1.2)
BUN: 11 mg/dL (ref 6–20)
CHLORIDE: 109 mmol/L (ref 101–111)
CO2: 22 mmol/L (ref 22–32)
Calcium: 8.7 mg/dL — ABNORMAL LOW (ref 8.9–10.3)
Creatinine, Ser: 0.73 mg/dL (ref 0.61–1.24)
GFR calc Af Amer: >60 mL/min (ref >60)
GFR calc non Af Amer: >60 mL/min (ref >60)
GLUCOSE: 104 mg/dL — AB (ref 65–99)
POTASSIUM: 4.3 mmol/L (ref 3.5–5.1)
SODIUM: 140 mmol/L (ref 135–145)
TOTAL PROTEIN: 7.6 g/dL (ref 6.5–8.1)

## 2017-08-07 LAB — LIPASE, BLOOD: Lipase: 119 U/L — ABNORMAL HIGH (ref 11–51)

## 2017-08-07 MED ORDER — GI COCKTAIL ~~LOC~~
30.0000 mL | Freq: Once | ORAL | Status: AC
  Administered 2017-08-07: 30 mL via ORAL
  Filled 2017-08-07: qty 30

## 2017-08-07 MED ORDER — KETOROLAC TROMETHAMINE 15 MG/ML IJ SOLN
15.0000 mg | Freq: Once | INTRAMUSCULAR | Status: AC
  Administered 2017-08-07: 15 mg via INTRAVENOUS
  Filled 2017-08-07: qty 1

## 2017-08-07 MED ORDER — ONDANSETRON HCL 4 MG/2ML IJ SOLN
4.0000 mg | Freq: Once | INTRAMUSCULAR | Status: AC
  Administered 2017-08-07: 4 mg via INTRAVENOUS
  Filled 2017-08-07: qty 2

## 2017-08-07 MED ORDER — SODIUM CHLORIDE 0.9 % IV BOLUS
500.0000 mL | Freq: Once | INTRAVENOUS | Status: AC
  Administered 2017-08-07: 500 mL via INTRAVENOUS

## 2017-08-07 MED ORDER — ONDANSETRON HCL 4 MG PO TABS: 4.0000 mg | ORAL_TABLET | Freq: Three times a day (TID) | ORAL | 0 refills | Status: DC | PRN

## 2017-08-07 MED ORDER — SUCRALFATE 1 G PO TABS
1.0000 g | ORAL_TABLET | Freq: Once | ORAL | Status: AC
  Administered 2017-08-07: 1 g via ORAL
  Filled 2017-08-07: qty 1

## 2017-08-07 NOTE — ED Provider Notes (Signed)
[facility] DEPT Provider Note   CSN: [id number] Arrival date & time: 08/07/17  0620     History   Chief Complaint Chief Complaint  Patient presents with  . Abdominal Pain  . Emesis    HPI [name] is a 47 y.o. male presenting for evaluation of epigastric abdominal pain, nausea, vomiting, diarrhea.  Patient with a history of chronic abdominal pain, often related to alcoholic pancreatitis.  Patient states that last night he started to have some discomfort and nausea.  Around 3:00 this morning, he had vomiting and increased epigastric pain.  He reports 4 episodes of diarrhea which have been nonbloody.  He took his at home medications including Prilosec, Carafate and Tylenol without improvement in symptoms.  He states he does not have any antiemetics at home.  He denies fevers, chills, chest pain, shortness of breath, or abnormal urination.  He denies alcohol use since he was seen in the ER 4 days ago.  He reports he last smoked weed last night. No other medical problems.   HPI  Past Medical History:  Diagnosis Date  . Alcoholism /alcohol abuse ([place])   . Anemia   . Anxiety   . Arthritis    "knees; arms; elbows" (03/26/2015)  . Asthma   . Bipolar disorder ([place])   . Chronic bronchitis ([place])   . Chronic lower back pain   . Cocaine abuse ([place])   . Depression   . Family history of adverse reaction to anesthesia    "grandmother gets confused"  . Femoral condyle fracture ([place]) 03/08/2014   left medial/notes 03/09/2014  . GERD (gastroesophageal reflux disease)   . H/O hiatal hernia   . H/O suicide attempt 10/2012  . Heart murmur    "when he was little" (03/06/2013)  . High cholesterol   . History of blood transfusion 10/2012   "when I tried to commit suicide"  . History of stomach ulcers   . Hypertension   . Marijuana abuse, continuous   . Migraine    "a few times/year" (03/26/2015)  . Pancreatitis   . Pneumonia 1990's X 3  . PTSD  (post-traumatic stress disorder)   . Shortness of breath    "can happen at anytime" (03/06/2013)  . Sickle cell trait ([place])   . WPW (Wolff-Parkinson-White syndrome)    [name] 03/06/2013    Patient Active Problem List   Diagnosis Date Noted  . Abdominal pain 05/27/2017  . Hematemesis 05/27/2017  . Tachycardia 03/18/2017  . Nausea & vomiting 03/18/2017  . Diarrhea 03/18/2017  . Acute on chronic pancreatitis ([place]) 12/17/2016  . Chronic alcoholic pancreatitis ([place]) 12/05/2016  . Intractable nausea and vomiting 12/05/2016  . Verbally abusive behavior 12/05/2016  . Normocytic anemia 12/05/2016  . Alcohol use disorder, severe, dependence ([place]) 07/25/2016  . Cocaine use disorder, severe, dependence ([place]) 07/25/2016  . 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 Leukocytosis   . Hospital acquired PNA 05/20/2015  . Pancreatitis 05/18/2015  . Pseudocyst of pancreas 05/18/2015  . Polysubstance abuse (tobacco, cocaine, THC, and ETOH) 03/26/2015  . Alcohol-induced chronic pancreatitis ([place])   . Benign essential HTN 02/06/2014  . Alcohol-induced acute pancreatitis 11/28/2013  . Pancreatic pseudocyst/cyst 11/25/2013  . MDD (major depressive disorder), recurrent severe, without psychosis ([place]) 10/22/2013  . Severe protein-calorie malnutrition ([place]) 10/10/2013  . Suicide attempt ([place]) 10/08/2013  . 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 TOBACCO ABUSE 03/23/2007    Past Surgical History:  Procedure Laterality Date  . CARDIAC CATHETERIZATION    . EYE SURGERY Left 1990's   "result of trauma"   . FACIAL FRACTURE SURGERY Left 1990's   "result of trauma"   . FRACTURE SURGERY    . HERNIA REPAIR    . LEFT HEART CATHETERIZATION WITH CORONARY ANGIOGRAM Right 03/07/2013   Procedure: LEFT HEART CATHETERIZATION WITH CORONARY ANGIOGRAM;  Surgeon: [name], MD;  Location: [facility];  Service: Cardiovascular;   Laterality: Right;  . UMBILICAL HERNIA REPAIR          Home Medications    Prior to Admission medications   Medication Sig Start Date End Date Taking? Authorizing Provider  albuterol (PROVENTIL HFA;VENTOLIN HFA) 108 (90 Base) MCG/ACT inhaler Inhale 2 puffs into the lungs every 6 (six) hours as needed for wheezing or shortness of breath. 07/25/16  Yes Lindell Spar I, NP  famotidine (PEPCID) 20 MG tablet Take 1 tablet (20 mg total) by mouth 2 (two) times daily. 07/05/17  Yes Sherwood Gambler, MD  hydrALAZINE (APRESOLINE) 25 MG tablet Take 1 tablet (25 mg total) by mouth every 8 (eight) hours. Patient taking differently: Take 25 mg by mouth at bedtime.  12/29/16  Yes Barton Dubois, MD  lipase/protease/amylase (CREON) 12000 units CPEP capsule Take 2 capsules (24,000 Units total) by mouth 3 (three) times daily with meals. For pancreatitis 06/06/17  Yes [name], MD  loratadine (CLARITIN) 10 MG tablet Take 1 tablet (10 mg total) by mouth daily. (May purchase from over the counter): For allergies 07/26/16  Yes Lindell Spar I, NP  metoprolol tartrate (LOPRESSOR) 25 MG tablet Take 1 tablet (25 mg total) by mouth 2 (two) times daily. For high blood pressure 12/29/16  Yes Barton Dubois, MD  ondansetron (ZOFRAN ODT) 8 MG disintegrating tablet Take 1 tablet (8 mg total) by mouth every 8 (eight) hours as needed for nausea or vomiting. 08/02/17  Yes Pattricia Boss, MD  pantoprazole (PROTONIX) 40 MG tablet Take 1 tablet (40 mg total) by mouth daily. 06/09/17  Yes [name], MD  sertraline (ZOLOFT) 25 MG tablet Take 3 tablets (75 mg total) by mouth daily. For depression 07/26/16  Yes Nwoko, Herbert Pun I, NP  sucralfate (CARAFATE) 1 g tablet Take 1 tablet (1 g total) by mouth 4 (four) times daily -  with meals and at bedtime. 07/10/17  Yes Kirichenko, Tatyana, PA-C  ondansetron (ZOFRAN) 4 MG tablet Take 1 tablet (4 mg total) by mouth every 8 (eight) hours as needed for nausea or vomiting. 08/07/17   Kaven Cumbie,  PA-C  omeprazole (PRILOSEC) 20 MG capsule Take 1 capsule (20 mg total) daily by mouth. 03/22/17 06/21/17  Varney Biles, MD    Family History Family History  Problem Relation Age of Onset  . Hypertension Mother   . Cirrhosis Mother   . Alcoholism Mother   . Hypertension Father   . Melanoma Father   . Hypertension Other   . Coronary artery disease Other     Social History Social History   Tobacco Use  . Smoking status: Current Every Day Smoker    Packs/day: 1.00    Types: Cigarettes, E-cigarettes  . Smokeless tobacco: Never Used  Substance Use Topics  . Alcohol use: Yes    Comment: last drink 07/01/17  . Drug use: Yes    Types: Marijuana     Allergies   Robaxin [methocarbamol]; Shellfish-derived products; Trazodone and nefazodone; Contrast media [iodinated diagnostic agents]; and Reglan [metoclopramide]   Review of Systems Review of  Systems  Gastrointestinal: Positive for abdominal pain, diarrhea, nausea and vomiting.  All other systems reviewed and are negative.    Physical Exam Updated Vital Signs BP (!) 153/85 (BP Location: Right Arm)   Pulse 84   Temp (!) 97.4 F (36.3 C) (Oral)   Resp 18   Ht 5\' 8"  (1.727 m)   Wt 65.8 kg (145 lb)   SpO2 100%   BMI 22.05 kg/m   Physical Exam  Constitutional: He is oriented to person, place, and time. He appears well-developed and well-nourished. No distress.  HENT:  Head: Normocephalic and atraumatic.  Eyes: Pupils are equal, round, and reactive to light. Conjunctivae and EOM are normal.  Neck: Normal range of motion. Neck supple.  Cardiovascular: Normal rate, regular rhythm and intact distal pulses.  Pulmonary/Chest: Effort normal and breath sounds normal. No respiratory distress. He has no wheezes.  Abdominal: Soft. Bowel sounds are normal. He exhibits no distension and no mass. There is tenderness. There is no rebound and no guarding.  Generalized ttp of abd with increased pain of epigastric abd. abdomen is soft  without rigidity, guarding, or distention.  Musculoskeletal: Normal range of motion.  Neurological: He is alert and oriented to person, place, and time.  Skin: Skin is warm and dry.  Psychiatric: He has a normal mood and affect.  Nursing note and vitals reviewed.    ED Treatments / Results  Labs (all labs ordered are listed, but only abnormal results are displayed) Labs Reviewed  CBC - Abnormal; Notable for the following components:      Result Value   WBC 13.5 (*)    RBC 3.40 (*)    Hemoglobin 8.9 (*)    HCT 28.5 (*)    RDW 16.1 (*)    All other components within normal limits  COMPREHENSIVE METABOLIC PANEL - Abnormal; Notable for the following components:   Glucose, Bld 104 (*)    Calcium 8.7 (*)    All other components within normal limits  LIPASE, BLOOD - Abnormal; Notable for the following components:   Lipase 119 (*)    All other components within normal limits    EKG None  Radiology No results found.  Procedures Procedures (including critical care time)  Medications Ordered in ED Medications  sodium chloride 0.9 % bolus 500 mL (0 mLs Intravenous Stopped 08/07/17 0857)  ondansetron (ZOFRAN) injection 4 mg (4 mg Intravenous Given 08/07/17 0756)  ketorolac (TORADOL) 15 MG/ML injection 15 mg (15 mg Intravenous Given 08/07/17 0756)  gi cocktail (Maalox,Lidocaine,Donnatal) (30 mLs Oral Given 08/07/17 0755)  sucralfate (CARAFATE) tablet 1 g (1 g Oral Given 08/07/17 0904)     Initial Impression / Assessment and Plan / ED Course  I have reviewed the triage vital signs and the nursing notes.  Pertinent labs & imaging results that were available during my care of the patient were reviewed by me and considered in my medical decision making (see chart for details).     Patient presenting for evaluation of epigastric abdominal pain with nausea, vomiting, diarrhea.  Physical examination, patient is afebrile not tachycardic.  He appears nontoxic.  Discussed with patient that this  is likely related to his chronic abdominal pain and marijuana use. Possible 2/2 gastroenteritis. Patient is requesting labs, will obtain basic abd labs. Fluid, zofran, and gi cocktail for sx control. Pt requesting ketamine and morphine. Pt told he will not receive narcotics, but will give toradol.   Labs show mild leukocytosis at 13.5.  Hemoglobin stable.  Electrolytes stable.  Lipase mildly elevated at 119.  Discussed findings with patient.  Discussed symptomatic treatment with antiemetics, hydration, clear liquid diet, and tylenol. As pt is displaying drug seeking behavior, will avoid narcotics.  I do not believe he needs admission.  He has had no vomiting while in the ER despite oral medications.  At this time, patient present for discharge.  Return precautions given.  Patient states he understands.   Final Clinical Impressions(s) / ED Diagnoses   Final diagnoses:  Epigastric abdominal pain    ED Discharge Orders        Ordered    ondansetron (ZOFRAN) 4 MG tablet  Every 8 hours PRN     08/07/17 0852       Franchot Heidelberg, PA-C 08/07/17 1112    Francine Graven, DO 08/08/17 1309

## 2017-08-07 NOTE — ED Triage Notes (Addendum)
Patient c/o mid abdominal pain and vomiting since 0300 today. patient reports a history of pancreatitis  zzzzzzzzzzpatiaent was at [facility] ED earlier and left because "the wait was too long.".

## 2017-08-07 NOTE — ED Triage Notes (Signed)
THE PT ARRIVED BY [facility] FROM HOME AMB TO TRIAGE  C.O ABD PAIN WITH NAUSEA AND VOMITING HE DENIES ALCOHOL TODAY REQUESTING LAB WORK  DISCUSSED WITH DR PALUMBO  NO LAB WORK PER HER

## 2017-08-07 NOTE — Discharge Instructions (Signed)
Continue taking all your medications as prescribed. Use Zofran as needed for nausea or vomiting. Stay on a clear liquid diet until your abdominal pain starts to improve.  Slowly progress your diet as your pain improves. Follow-up with your primary care doctor as needed for continued abdominal pain. Return to the emergency room if you develop high fevers, persistent vomiting despite medication, or any new or concerning symptoms.

## 2017-08-16 ENCOUNTER — Encounter (HOSPITAL_COMMUNITY): Payer: Self-pay | Admitting: Emergency Medicine

## 2017-08-16 ENCOUNTER — Other Ambulatory Visit: Payer: Self-pay

## 2017-08-16 ENCOUNTER — Emergency Department (HOSPITAL_COMMUNITY)
Admission: EM | Admit: 2017-08-16 | Discharge: 2017-08-16 | Disposition: A | Discharge status: Home / Self Care | Payer: Self-pay | Attending: Emergency Medicine | Admitting: Emergency Medicine

## 2017-08-16 DIAGNOSIS — D573 Sickle-cell trait: Secondary | ICD-10-CM | POA: Insufficient documentation

## 2017-08-16 DIAGNOSIS — F419 Anxiety disorder, unspecified: Secondary | ICD-10-CM | POA: Insufficient documentation

## 2017-08-16 DIAGNOSIS — F1420 Cocaine dependence, uncomplicated: Secondary | ICD-10-CM | POA: Insufficient documentation

## 2017-08-16 DIAGNOSIS — R1012 Left upper quadrant pain: Secondary | ICD-10-CM | POA: Insufficient documentation

## 2017-08-16 DIAGNOSIS — Z79899 Other long term (current) drug therapy: Secondary | ICD-10-CM | POA: Insufficient documentation

## 2017-08-16 DIAGNOSIS — F319 Bipolar disorder, unspecified: Secondary | ICD-10-CM | POA: Insufficient documentation

## 2017-08-16 DIAGNOSIS — I10 Essential (primary) hypertension: Secondary | ICD-10-CM | POA: Insufficient documentation

## 2017-08-16 DIAGNOSIS — J45909 Unspecified asthma, uncomplicated: Secondary | ICD-10-CM | POA: Insufficient documentation

## 2017-08-16 DIAGNOSIS — R109 Unspecified abdominal pain: Secondary | ICD-10-CM

## 2017-08-16 DIAGNOSIS — I456 Pre-excitation syndrome: Secondary | ICD-10-CM | POA: Insufficient documentation

## 2017-08-16 DIAGNOSIS — G8929 Other chronic pain: Secondary | ICD-10-CM | POA: Insufficient documentation

## 2017-08-16 DIAGNOSIS — F17210 Nicotine dependence, cigarettes, uncomplicated: Secondary | ICD-10-CM | POA: Insufficient documentation

## 2017-08-16 LAB — COMPREHENSIVE METABOLIC PANEL
ALBUMIN: 4 g/dL (ref 3.5–5.0)
ALK PHOS: 86 U/L (ref 38–126)
ALT: 25 U/L (ref 17–63)
AST: 36 U/L (ref 15–41)
Anion gap: 13 (ref 5–15)
BILIRUBIN TOTAL: 0.3 mg/dL (ref 0.3–1.2)
BUN: 12 mg/dL (ref 6–20)
CALCIUM: 9.3 mg/dL (ref 8.9–10.3)
CO2: 16 mmol/L — AB (ref 22–32)
CREATININE: 0.88 mg/dL (ref 0.61–1.24)
Chloride: 109 mmol/L (ref 101–111)
GFR calc Af Amer: >60 mL/min (ref >60)
GFR calc non Af Amer: >60 mL/min (ref >60)
GLUCOSE: 72 mg/dL (ref 65–99)
Potassium: 3.9 mmol/L (ref 3.5–5.1)
SODIUM: 138 mmol/L (ref 135–145)
TOTAL PROTEIN: 8.3 g/dL — AB (ref 6.5–8.1)

## 2017-08-16 LAB — CBC WITH DIFFERENTIAL/PLATELET
Basophils Absolute: 0 10*3/uL (ref 0.0–0.1)
Basophils Relative: 0 %
EOS ABS: 0.4 10*3/uL (ref 0.0–0.7)
EOS PCT: 4 %
HCT: 29.1 % — ABNORMAL LOW (ref 39.0–52.0)
HEMOGLOBIN: 9 g/dL — AB (ref 13.0–17.0)
LYMPHS ABS: 3.8 10*3/uL (ref 0.7–4.0)
Lymphocytes Relative: 34 %
MCH: 25.9 pg — AB (ref 26.0–34.0)
MCHC: 30.9 g/dL (ref 30.0–36.0)
MCV: 83.6 fL (ref 78.0–100.0)
MONOS PCT: 6 %
Monocytes Absolute: 0.7 10*3/uL (ref 0.1–1.0)
Neutro Abs: 6.3 10*3/uL (ref 1.7–7.7)
Neutrophils Relative %: 56 %
Platelets: 198 10*3/uL (ref 150–400)
RBC: 3.48 MIL/uL — ABNORMAL LOW (ref 4.22–5.81)
RDW: 17.4 % — ABNORMAL HIGH (ref 11.5–15.5)
WBC: 11.2 10*3/uL — ABNORMAL HIGH (ref 4.0–10.5)

## 2017-08-16 LAB — LIPASE, BLOOD: Lipase: 81 U/L — ABNORMAL HIGH (ref 11–51)

## 2017-08-16 MED ORDER — MORPHINE SULFATE (PF) 4 MG/ML IV SOLN
4.0000 mg | Freq: Once | INTRAVENOUS | Status: AC
  Administered 2017-08-16: 4 mg via INTRAVENOUS
  Filled 2017-08-16: qty 1

## 2017-08-16 MED ORDER — SODIUM CHLORIDE 0.9 % IV BOLUS: 1000.0000 mL | Freq: Once | INTRAVENOUS | Status: DC

## 2017-08-16 MED ORDER — ONDANSETRON HCL 4 MG/2ML IJ SOLN
4.0000 mg | Freq: Once | INTRAMUSCULAR | Status: AC
  Administered 2017-08-16: 4 mg via INTRAVENOUS
  Filled 2017-08-16: qty 2

## 2017-08-16 MED ORDER — SODIUM CHLORIDE 0.9 % IV BOLUS
1000.0000 mL | Freq: Once | INTRAVENOUS | Status: AC
  Administered 2017-08-16: 1000 mL via INTRAVENOUS

## 2017-08-16 MED ORDER — GI COCKTAIL ~~LOC~~
30.0000 mL | Freq: Once | ORAL | Status: AC
  Administered 2017-08-16: 30 mL via ORAL
  Filled 2017-08-16: qty 30

## 2017-08-16 MED ORDER — LORAZEPAM 2 MG/ML IJ SOLN
0.5000 mg | Freq: Once | INTRAMUSCULAR | Status: AC
  Administered 2017-08-16: 0.5 mg via INTRAVENOUS
  Filled 2017-08-16: qty 1

## 2017-08-16 MED ORDER — KETOROLAC TROMETHAMINE 15 MG/ML IJ SOLN
15.0000 mg | Freq: Once | INTRAMUSCULAR | Status: AC
  Administered 2017-08-16: 15 mg via INTRAVENOUS
  Filled 2017-08-16: qty 1

## 2017-08-16 NOTE — ED Provider Notes (Signed)
[facility] DEPT Provider Note   CSN: [id number] Arrival date & time: 08/16/17  1912     History   Chief Complaint Chief Complaint  Patient presents with  . Abdominal Pain    hx pancreatitis    HPI [name] is a 47 y.o. male who presents with abdominal pain. PMH significant for chronic abdominal pain, chronic back pain, alcohol induced chronic pancreatitis, cocaine abuse.  He states that he has a flareup of his chronic pancreatitis that started today.  He attributes this to his alcohol use plus eating steak and eggs.  His the pain has become severe in nature and he reports associated nausea, one episode of vomiting, diarrhea.  He states he does have an appointment with her primary doctor on the 23rd of the month.  He has been taking ibuprofen and BC powders without relief of his pain.  He denies fever, chest pain, shortness of breath.  He states he understands he cannot receive narcotics however he is requesting ketamine.  HPI  Past Medical History:  Diagnosis Date  . Alcoholism /alcohol abuse ([employer])   . Anemia   . Anxiety   . Arthritis    "knees; arms; elbows" (03/26/2015)  . Asthma   . Bipolar disorder ([place])   . Chronic bronchitis ([place])   . Chronic lower back pain   . Cocaine abuse ([place])   . Depression   . Family history of adverse reaction to anesthesia    "grandmother gets confused"  . Femoral condyle fracture ([place]) 03/08/2014   left medial/notes 03/09/2014  . GERD (gastroesophageal reflux disease)   . H/O hiatal hernia   . H/O suicide attempt 10/2012  . Heart murmur    "when he was little" (03/06/2013)  . High cholesterol   . History of blood transfusion 10/2012   "when I tried to commit suicide"  . History of stomach ulcers   . Hypertension   . Marijuana abuse, continuous   . Migraine    "a few times/year" (03/26/2015)  . Pancreatitis   . Pneumonia 1990's X 3  . PTSD (post-traumatic stress disorder)   . Shortness of  breath    "can happen at anytime" (03/06/2013)  . Sickle cell trait ([place])   . WPW (Wolff-Parkinson-White syndrome)    [name] 03/06/2013    Patient Active Problem List   Diagnosis Date Noted  . Abdominal pain 05/27/2017  . Hematemesis 05/27/2017  . Tachycardia 03/18/2017  . Nausea & vomiting 03/18/2017  . Diarrhea 03/18/2017  . Acute on chronic pancreatitis ([place]) 12/17/2016  . Chronic alcoholic pancreatitis ([place]) 12/05/2016  . Intractable nausea and vomiting 12/05/2016  . Verbally abusive behavior 12/05/2016  . Normocytic anemia 12/05/2016  . Alcohol use disorder, severe, dependence ([place]) 07/25/2016  . Cocaine use disorder, severe, dependence ([place]) 07/25/2016  . Major depressive disorder, recurrent severe without psychotic features ([place]) 07/20/2016  . Leukocytosis   . Hospital acquired PNA 05/20/2015  . Pancreatitis 05/18/2015  . Pseudocyst of pancreas 05/18/2015  . Polysubstance abuse (tobacco, cocaine, THC, and ETOH) 03/26/2015  . Alcohol-induced chronic pancreatitis ([place])   . Benign essential HTN 02/06/2014  . Alcohol-induced acute pancreatitis 11/28/2013  . Pancreatic pseudocyst/cyst 11/25/2013  . MDD (major depressive disorder), recurrent severe, without psychosis ([place]) 10/22/2013  . Severe protein-calorie malnutrition ([place]) 10/10/2013  . Suicide attempt ([place]) 10/08/2013  . 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 TOBACCO ABUSE 03/23/2007    Past Surgical History:  Procedure Laterality Date  .  CARDIAC CATHETERIZATION    . EYE SURGERY Left 1990's   "result of trauma"   . FACIAL FRACTURE SURGERY Left 1990's   "result of trauma"   . FRACTURE SURGERY    . HERNIA REPAIR    . LEFT HEART CATHETERIZATION WITH CORONARY ANGIOGRAM Right 03/07/2013   Procedure: LEFT HEART CATHETERIZATION WITH CORONARY ANGIOGRAM;  Surgeon: [name], MD;  Location: [facility];  Service: Cardiovascular;  Laterality: Right;  . UMBILICAL HERNIA REPAIR           Home Medications    Prior to Admission medications   Medication Sig Start Date End Date Taking? Authorizing Provider  albuterol (PROVENTIL HFA;VENTOLIN HFA) 108 (90 Base) MCG/ACT inhaler Inhale 2 puffs into the lungs every 6 (six) hours as needed for wheezing or shortness of breath. 07/25/16   Lindell Spar I, NP  famotidine (PEPCID) 20 MG tablet Take 1 tablet (20 mg total) by mouth 2 (two) times daily. 07/05/17   Sherwood Gambler, MD  hydrALAZINE (APRESOLINE) 25 MG tablet Take 1 tablet (25 mg total) by mouth every 8 (eight) hours. Patient taking differently: Take 25 mg by mouth at bedtime.  12/29/16   Barton Dubois, MD  lipase/protease/amylase (CREON) 12000 units CPEP capsule Take 2 capsules (24,000 Units total) by mouth 3 (three) times daily with meals. For pancreatitis 06/06/17   [name], MD  loratadine (CLARITIN) 10 MG tablet Take 1 tablet (10 mg total) by mouth daily. (May purchase from over the counter): For allergies 07/26/16   Lindell Spar I, NP  metoprolol tartrate (LOPRESSOR) 25 MG tablet Take 1 tablet (25 mg total) by mouth 2 (two) times daily. For high blood pressure 12/29/16   Barton Dubois, MD  ondansetron (ZOFRAN ODT) 8 MG disintegrating tablet Take 1 tablet (8 mg total) by mouth every 8 (eight) hours as needed for nausea or vomiting. 08/02/17   Pattricia Boss, MD  ondansetron (ZOFRAN) 4 MG tablet Take 1 tablet (4 mg total) by mouth every 8 (eight) hours as needed for nausea or vomiting. 08/07/17   Caccavale, Sophia, PA-C  pantoprazole (PROTONIX) 40 MG tablet Take 1 tablet (40 mg total) by mouth daily. 06/09/17   [name], MD  sertraline (ZOLOFT) 25 MG tablet Take 3 tablets (75 mg total) by mouth daily. For depression 07/26/16   Lindell Spar I, NP  sucralfate (CARAFATE) 1 g tablet Take 1 tablet (1 g total) by mouth 4 (four) times daily -  with meals and at bedtime. 07/10/17   Kirichenko, Lahoma Rocker, PA-C  omeprazole (PRILOSEC) 20 MG capsule Take 1 capsule (20 mg total) daily by  mouth. 03/22/17 06/21/17  Varney Biles, MD    Family History Family History  Problem Relation Age of Onset  . Hypertension Mother   . Cirrhosis Mother   . Alcoholism Mother   . Hypertension Father   . Melanoma Father   . Hypertension Other   . Coronary artery disease Other     Social History Social History   Tobacco Use  . Smoking status: Current Every Day Smoker    Packs/day: 1.00    Types: Cigarettes, E-cigarettes  . Smokeless tobacco: Never Used  Substance Use Topics  . Alcohol use: Yes    Comment: last drink 07/01/17  . Drug use: Yes    Types: Marijuana     Allergies   Robaxin [methocarbamol]; Shellfish-derived products; Trazodone and nefazodone; Contrast media [iodinated diagnostic agents]; and Reglan [metoclopramide]   Review of Systems Review of Systems  Constitutional: Negative for  fever.  Respiratory: Negative for shortness of breath.   Cardiovascular: Negative for chest pain.  Gastrointestinal: Positive for abdominal pain, diarrhea, nausea and vomiting.  Genitourinary: Negative for dysuria.  All other systems reviewed and are negative.    Physical Exam Updated Vital Signs BP (!) 129/105 (BP Location: Left Arm)   Pulse (!) 109   Temp 98.6 F (37 C) (Oral)   Resp 16   SpO2 100%   Physical Exam  Constitutional: He is oriented to person, place, and time. He appears well-developed and well-nourished. No distress.  Calm, cooperative.  Uncomfortable appearing  HENT:  Head: Normocephalic and atraumatic.  Eyes: Pupils are equal, round, and reactive to light. Conjunctivae are normal. Right eye exhibits no discharge. Left eye exhibits no discharge. No scleral icterus.  Neck: Normal range of motion.  Cardiovascular: Regular rhythm. Tachycardia present. Exam reveals no gallop and no friction rub.  No murmur heard. Pulmonary/Chest: Effort normal and breath sounds normal. No respiratory distress.  Abdominal: Soft. Bowel sounds are normal. He exhibits no  distension. There is tenderness (Left upper quadrant tenderness).  Neurological: He is alert and oriented to person, place, and time.  Skin: Skin is warm and dry.  Psychiatric: He has a normal mood and affect. His behavior is normal.  Nursing note and vitals reviewed.    ED Treatments / Results  Labs (all labs ordered are listed, but only abnormal results are displayed) Labs Reviewed  COMPREHENSIVE METABOLIC PANEL  LIPASE, BLOOD  CBC WITH DIFFERENTIAL/PLATELET    EKG None  Radiology No results found.  Procedures Procedures (including critical care time)  Medications Ordered in ED Medications - No data to display   Initial Impression / Assessment and Plan / ED Course  I have reviewed the triage vital signs and the nursing notes.  Pertinent labs & imaging results that were available during my care of the patient were reviewed by me and considered in my medical decision making (see chart for details).  47 year old male presents with acute on chronic abdominal pain.  He feels like it is consistent with prior episodes of pancreatitis.  He is tachycardic and hypertensive but otherwise vital signs are normal.  Care plan was reviewed.  Discussed with the patient that we cannot give narcotics today.  He verbalized understanding.  Fluids, Zofran, GI cocktail, Toradol were ordered.  10:31 PM Pt states that pain is not better. His HR is improved but still elevated. Labs reviewed reveal bicarb is 16, lipase is 81. He has a mild leukocytosis and anemia is at baseline. He is also requesting assistance with alcohol cessation. SW and CM were consulted. Will give another fluid bolus and morphine and d/c.   Final Clinical Impressions(s) / ED Diagnoses   Final diagnoses:  Chronic abdominal pain    ED Discharge Orders    None       Recardo Evangelist, PA-C 08/17/17 1218    Dorie Rank, MD 08/17/17 1428

## 2017-08-16 NOTE — Care Management (Addendum)
Patient is followed by [facility] Provider M. [name]. [facility] ED daytime CM will follow up with patient and [facility] Staff concerning medication assistance the am.

## 2017-08-16 NOTE — ED Notes (Signed)
Bed: FQ72 Expected date: 08/16/17 Expected time: 7:21 PM Means of arrival: Ambulance Comments: Abd pain history of Pancreatitis

## 2017-08-16 NOTE — ED Notes (Signed)
Patient drinking ginger ale.

## 2017-08-16 NOTE — ED Triage Notes (Addendum)
Patient arrives complaining of abd pain. Patient has hx of chronic abd pain and pancreatitis. Patient states that he had a lot of fatty foods today. Possible ETOH use. Patient complains of nausea but no vomiting and has a few bouts of diarrhea.

## 2017-08-16 NOTE — Progress Notes (Addendum)
Consult request for rehab resources has been received. CSW attempting to follow up at present time.  CSW met with pt to offer resources and as session progressed, pt accepted meeting schedules for area Alcoholics Anonymous [id number] meetings as well as inpatient/outpatient SA Tx resources.  CSW provided education to the pt as to the efficacy of 12-step programs for community support for those needing support in addition to or other than inpatient/outpatient treatment and provided pt with list of [employer] in [place] and educated the pt on making contact, assessing for open beds and requesting an interview/assessment for admission. Pt appreciated CSW's efforts and thanked the CSW.  CSW spoke with CM who stated pt has been connected with services for medication assistance [facility] with the [facility] and should follow up with them at D/C. CSW advised pt of this and pt voiced understanding.  Pt was appreciative of this and thanked the CSW.  Please reconsult if future social work needs arise.  CSW signing off, as social work intervention is no longer needed.  [name]. Thekla Colborn, LCSW, LCAS, CSI Clinical Social Worker Ph: [phone]

## 2017-08-18 ENCOUNTER — Emergency Department (HOSPITAL_COMMUNITY)
Admission: EM | Admit: 2017-08-18 | Discharge: 2017-08-18 | Disposition: A | Discharge status: Home / Self Care | Payer: Self-pay | Attending: Emergency Medicine | Admitting: Emergency Medicine

## 2017-08-18 ENCOUNTER — Encounter (HOSPITAL_COMMUNITY): Payer: Self-pay | Admitting: Emergency Medicine

## 2017-08-18 DIAGNOSIS — F17210 Nicotine dependence, cigarettes, uncomplicated: Secondary | ICD-10-CM | POA: Insufficient documentation

## 2017-08-18 DIAGNOSIS — R109 Unspecified abdominal pain: Secondary | ICD-10-CM

## 2017-08-18 DIAGNOSIS — Z79899 Other long term (current) drug therapy: Secondary | ICD-10-CM | POA: Insufficient documentation

## 2017-08-18 DIAGNOSIS — G8929 Other chronic pain: Secondary | ICD-10-CM | POA: Insufficient documentation

## 2017-08-18 DIAGNOSIS — R1012 Left upper quadrant pain: Secondary | ICD-10-CM | POA: Insufficient documentation

## 2017-08-18 LAB — CBC WITH DIFFERENTIAL/PLATELET
BASOS PCT: 0 %
Basophils Absolute: 0 10*3/uL (ref 0.0–0.1)
EOS ABS: 0.5 10*3/uL (ref 0.0–0.7)
Eosinophils Relative: 4 %
HCT: 28.7 % — ABNORMAL LOW (ref 39.0–52.0)
HEMOGLOBIN: 8.8 g/dL — AB (ref 13.0–17.0)
Lymphocytes Relative: 26 %
Lymphs Abs: 3.1 10*3/uL (ref 0.7–4.0)
MCH: 25.7 pg — ABNORMAL LOW (ref 26.0–34.0)
MCHC: 30.7 g/dL (ref 30.0–36.0)
MCV: 83.9 fL (ref 78.0–100.0)
MONOS PCT: 9 %
Monocytes Absolute: 1.1 10*3/uL — ABNORMAL HIGH (ref 0.1–1.0)
NEUTROS PCT: 61 %
Neutro Abs: 7.2 10*3/uL (ref 1.7–7.7)
Platelets: 193 10*3/uL (ref 150–400)
RBC: 3.42 MIL/uL — ABNORMAL LOW (ref 4.22–5.81)
RDW: 17.6 % — AB (ref 11.5–15.5)
WBC: 11.9 10*3/uL — ABNORMAL HIGH (ref 4.0–10.5)

## 2017-08-18 LAB — COMPREHENSIVE METABOLIC PANEL
ALBUMIN: 4 g/dL (ref 3.5–5.0)
ALK PHOS: 83 U/L (ref 38–126)
ALT: 20 U/L (ref 17–63)
ANION GAP: 11 (ref 5–15)
AST: 34 U/L (ref 15–41)
BILIRUBIN TOTAL: 0.5 mg/dL (ref 0.3–1.2)
BUN: 14 mg/dL (ref 6–20)
CALCIUM: 9.1 mg/dL (ref 8.9–10.3)
CO2: 20 mmol/L — ABNORMAL LOW (ref 22–32)
Chloride: 107 mmol/L (ref 101–111)
Creatinine, Ser: 0.73 mg/dL (ref 0.61–1.24)
GLUCOSE: 96 mg/dL (ref 65–99)
Potassium: 3.8 mmol/L (ref 3.5–5.1)
Sodium: 138 mmol/L (ref 135–145)
TOTAL PROTEIN: 8.3 g/dL — AB (ref 6.5–8.1)

## 2017-08-18 LAB — LIPASE, BLOOD: LIPASE: 68 U/L — AB (ref 11–51)

## 2017-08-18 MED ORDER — FAMOTIDINE IN NACL 20-0.9 MG/50ML-% IV SOLN
20.0000 mg | Freq: Once | INTRAVENOUS | Status: AC
  Administered 2017-08-18: 20 mg via INTRAVENOUS
  Filled 2017-08-18: qty 50

## 2017-08-18 MED ORDER — GI COCKTAIL ~~LOC~~
30.0000 mL | Freq: Once | ORAL | Status: AC
Start: 2017-08-18 — End: 2017-08-18
  Administered 2017-08-18: 30 mL via ORAL
  Filled 2017-08-18: qty 30

## 2017-08-18 MED ORDER — KETOROLAC TROMETHAMINE 15 MG/ML IJ SOLN
7.5000 mg | Freq: Once | INTRAMUSCULAR | Status: AC
  Administered 2017-08-18: 7.5 mg via INTRAVENOUS
  Filled 2017-08-18: qty 1

## 2017-08-18 MED ORDER — SODIUM CHLORIDE 0.9 % IV BOLUS
1000.0000 mL | Freq: Once | INTRAVENOUS | Status: AC
  Administered 2017-08-18: 1000 mL via INTRAVENOUS

## 2017-08-18 MED ORDER — ONDANSETRON HCL 4 MG/2ML IJ SOLN
4.0000 mg | Freq: Once | INTRAMUSCULAR | Status: AC
  Administered 2017-08-18: 4 mg via INTRAVENOUS
  Filled 2017-08-18: qty 2

## 2017-08-18 NOTE — ED Triage Notes (Signed)
Per [facility] pt comes from home for abd pain with n/v. Hx pancreatitis and seen already this week for same.

## 2017-08-18 NOTE — ED Provider Notes (Addendum)
[facility] DEPT Provider Note   CSN: [id number] Arrival date & time: 08/18/17  1634     History   Chief Complaint Chief Complaint  Patient presents with  . Abdominal Pain  . Emesis    HPI [name] is a 47 y.o. male.  HPI  [name] is a 47 y.o. male who presents to the Emergency Department complaining of LUQ pain.  He presents for evaluation of the left upper quadrant abdominal pain that began 2 to 3 days ago. He reports associated emesis yesterday. Symptoms are similar to prior episodes of pancreatitis. Sxs are moderate and waxing and waning in nature.  He denies any fevers, hepatic emesis, hematochezia, melena. He states that during his last ED visit he received a dose of morphine and that helped a lot and his pain was gone for a day. His last drink of alcohol was three days ago. He has a history of chronic pancreatitis and does not have a gastroenterologist yet. He is scheduled to see PCP on April 23.  Past Medical History:  Diagnosis Date  . Alcoholism /alcohol abuse ([place])   . Anemia   . Anxiety   . Arthritis    "knees; arms; elbows" (03/26/2015)  . Asthma   . Bipolar disorder ([place])   . Chronic bronchitis ([place])   . Chronic lower back pain   . Cocaine abuse ([place])   . Depression   . Family history of adverse reaction to anesthesia    "grandmother gets confused"  . Femoral condyle fracture ([place]) 03/08/2014   left medial/notes 03/09/2014  . GERD (gastroesophageal reflux disease)   . H/O hiatal hernia   . H/O suicide attempt 10/2012  . Heart murmur    "when he was little" (03/06/2013)  . High cholesterol   . History of blood transfusion 10/2012   "when I tried to commit suicide"  . History of stomach ulcers   . Hypertension   . Marijuana abuse, continuous   . Migraine    "a few times/year" (03/26/2015)  . Pancreatitis   . Pneumonia 1990's X 3  . PTSD (post-traumatic stress disorder)   . Shortness of breath      "can happen at anytime" (03/06/2013)  . Sickle cell trait ([place])   . WPW (Wolff-Parkinson-White syndrome)    [name] 03/06/2013    Patient Active Problem List   Diagnosis Date Noted  . Abdominal pain 05/27/2017  . Hematemesis 05/27/2017  . Tachycardia 03/18/2017  . Nausea & vomiting 03/18/2017  . Diarrhea 03/18/2017  . Acute on chronic pancreatitis ([place]) 12/17/2016  . Chronic alcoholic pancreatitis ([place]) 12/05/2016  . Intractable nausea and vomiting 12/05/2016  . Verbally abusive behavior 12/05/2016  . Normocytic anemia 12/05/2016  . Alcohol use disorder, severe, dependence ([place]) 07/25/2016  . Cocaine use disorder, severe, dependence ([place]) 07/25/2016  . 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 Leukocytosis   . Hospital acquired PNA 05/20/2015  . Pancreatitis 05/18/2015  . Pseudocyst of pancreas 05/18/2015  . Polysubstance abuse (tobacco, cocaine, THC, and ETOH) 03/26/2015  . Alcohol-induced chronic pancreatitis ([place])   . Benign essential HTN 02/06/2014  . Alcohol-induced acute pancreatitis 11/28/2013  . Pancreatic pseudocyst/cyst 11/25/2013  . MDD (major depressive disorder), recurrent severe, without psychosis ([place]) 10/22/2013  . Severe protein-calorie malnutrition ([place]) 10/10/2013  . Suicide attempt ([place]) 10/08/2013  . 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 TOBACCO ABUSE 03/23/2007    Past Surgical History:  Procedure Laterality Date  . CARDIAC CATHETERIZATION    . EYE SURGERY Left 1990's   "result of trauma"   . FACIAL FRACTURE SURGERY Left 1990's   "result of trauma"   . FRACTURE SURGERY    . HERNIA REPAIR    . LEFT HEART CATHETERIZATION WITH CORONARY ANGIOGRAM Right 03/07/2013   Procedure: LEFT HEART CATHETERIZATION WITH CORONARY ANGIOGRAM;  Surgeon: [name], MD;  Location: [facility]ATH [facility];  Service: Cardiovascular;  Laterality: Right;  . UMBILICAL HERNIA REPAIR           Home Medications    Prior to Admission medications   Medication Sig Start Date End Date Taking? Authorizing Provider  albuterol (PROVENTIL HFA;VENTOLIN HFA) 108 (90 Base) MCG/ACT inhaler Inhale 2 puffs into the lungs every 6 (six) hours as needed for wheezing or shortness of breath. 07/25/16  Yes Lindell Spar I, NP  famotidine (PEPCID) 20 MG tablet Take 1 tablet (20 mg total) by mouth 2 (two) times daily. 07/05/17  Yes Sherwood Gambler, MD  hydrALAZINE (APRESOLINE) 25 MG tablet Take 1 tablet (25 mg total) by mouth every 8 (eight) hours. Patient taking differently: Take 25 mg by mouth at bedtime.  12/29/16  Yes Barton Dubois, MD  lipase/protease/amylase (CREON) 12000 units CPEP capsule Take 2 capsules (24,000 Units total) by mouth 3 (three) times daily with meals. For pancreatitis 06/06/17  Yes [name], MD  loratadine (CLARITIN) 10 MG tablet Take 1 tablet (10 mg total) by mouth daily. (May purchase from over the counter): For allergies 07/26/16  Yes Lindell Spar I, NP  metoprolol tartrate (LOPRESSOR) 25 MG tablet Take 1 tablet (25 mg total) by mouth 2 (two) times daily. For high blood pressure 12/29/16  Yes Barton Dubois, MD  ondansetron (ZOFRAN ODT) 8 MG disintegrating tablet Take 1 tablet (8 mg total) by mouth every 8 (eight) hours as needed for nausea or vomiting. 08/02/17  Yes Pattricia Boss, MD  pantoprazole (PROTONIX) 40 MG tablet Take 1 tablet (40 mg total) by mouth daily. 06/09/17  Yes [name], MD  sertraline (ZOLOFT) 25 MG tablet Take 3 tablets (75 mg total) by mouth daily. For depression 07/26/16  Yes Nwoko, Herbert Pun I, NP  sucralfate (CARAFATE) 1 g tablet Take 1 tablet (1 g total) by mouth 4 (four) times daily -  with meals and at bedtime. 07/10/17  Yes Kirichenko, Tatyana, PA-C  ondansetron (ZOFRAN) 4 MG tablet Take 1 tablet (4 mg total) by mouth every 8 (eight) hours as needed for nausea or vomiting. Patient not taking: Reported on 08/18/2017 08/07/17   Caccavale, Sophia, PA-C   omeprazole (PRILOSEC) 20 MG capsule Take 1 capsule (20 mg total) daily by mouth. 03/22/17 06/21/17  Varney Biles, MD    Family History Family History  Problem Relation Age of Onset  . Hypertension Mother   . Cirrhosis Mother   . Alcoholism Mother   . Hypertension Father   . Melanoma Father   . Hypertension Other   . Coronary artery disease Other     Social History Social History   Tobacco Use  . Smoking status: Current Every Day Smoker    Packs/day: 1.00    Types: Cigarettes, E-cigarettes  . Smokeless tobacco: Never Used  Substance Use Topics  . Alcohol use: Yes    Comment: last drink 07/01/17  . Drug use: Yes    Types: Marijuana     Allergies   Robaxin [methocarbamol]; Shellfish-derived products; Trazodone and nefazodone; Contrast media [iodinated diagnostic agents]; and Reglan [metoclopramide]  Review of Systems Review of Systems   Physical Exam Updated Vital Signs BP (!) 154/96 (BP Location: Right Arm)   Pulse 91   Temp 98.9 F (37.2 C) (Oral)   Resp 20   Wt 63.5 kg (140 lb)   SpO2 100%   BMI 21.29 kg/m   Physical Exam  Constitutional: He is oriented to person, place, and time. He appears well-developed and well-nourished.  HENT:  Head: Normocephalic and atraumatic.  Cardiovascular: Normal rate and regular rhythm.  No murmur heard. Pulmonary/Chest: Effort normal and breath sounds normal. No respiratory distress.  Abdominal: Soft.  Mild epigastric tenderness without guarding or rebound.   Musculoskeletal: He exhibits no edema or tenderness.  Neurological: He is alert and oriented to person, place, and time.  Skin: Skin is warm and dry.  Psychiatric: He has a normal mood and affect. His behavior is normal.  Nursing note and vitals reviewed.    ED Treatments / Results  Labs (all labs ordered are listed, but only abnormal results are displayed) Labs Reviewed  COMPREHENSIVE METABOLIC PANEL - Abnormal; Notable for the following components:       Result Value   CO2 20 (*)    Total Protein 8.3 (*)    All other components within normal limits  LIPASE, BLOOD - Abnormal; Notable for the following components:   Lipase 68 (*)    All other components within normal limits  CBC WITH DIFFERENTIAL/PLATELET - Abnormal; Notable for the following components:   WBC 11.9 (*)    RBC 3.42 (*)    Hemoglobin 8.8 (*)    HCT 28.7 (*)    MCH 25.7 (*)    RDW 17.6 (*)    Monocytes Absolute 1.1 (*)    All other components within normal limits    EKG None  Radiology No results found.  Procedures Procedures (including critical care time)  Medications Ordered in ED Medications  sodium chloride 0.9 % bolus 1,000 mL (0 mLs Intravenous Stopped 08/18/17 2040)  gi cocktail (Maalox,Lidocaine,Donnatal) (30 mLs Oral Given 08/18/17 1930)  ondansetron (ZOFRAN) injection 4 mg (4 mg Intravenous Given 08/18/17 1931)  famotidine (PEPCID) IVPB 20 mg premix (0 mg Intravenous Stopped 08/18/17 2040)  ketorolac (TORADOL) 15 MG/ML injection 7.5 mg (7.5 mg Intravenous Given 08/18/17 2138)     Initial Impression / Assessment and Plan / ED Course  I have reviewed the triage vital signs and the nursing notes.  Pertinent labs & imaging results that were available during my care of the patient were reviewed by me and considered in my medical decision making (see chart for details).     Patient with history of poly substance abuse, alcohol abuse, chronic pancreatitis here for evaluation of abdominal pain and vomiting. He is non-toxic appearing on examination with mild upper abdominal tenderness. There are no peritoneal findings. He is not having any vomiting in the emergency department. Labs demonstrate stable anemia with mild elevation in his lipase, no evidence of acute pancreatitis or major electrolyte abnormalities. Discussed with patient that narcotics are not the appropriate medication for acute on chronic pancreatitis. Discussed continuing his home medications with  PCP, G.I. follow-up. Discussed avoiding alcohol intake. He does feel improved after treatment in the emergency department. He does request suggested Toradol. He does not have any history of gastric ulcers or G.I. bleed, feel one time dose is safe.  Final Clinical Impressions(s) / ED Diagnoses   Final diagnoses:  Left upper quadrant pain  Chronic abdominal pain    ED  Discharge Orders    None       Quintella Reichert, MD 08/18/17 2302    Quintella Reichert, MD 09/20/17 1147

## 2017-08-18 NOTE — Discharge Instructions (Addendum)
You can take maalox, available over the counter for abdominal pain.  Take according to label instructions.

## 2017-08-18 NOTE — ED Notes (Signed)
Pt is alert and oriented x 4 and is verbally responsive. Pt reports that ha has rt lower abdominal pain with associated n/v with 3 episodes in the past 24 hours.

## 2017-08-25 IMAGING — DX DG CHEST 2V
2 series · 2 of 2 positions shown · non-contrast
Comparison: 11/26/2014

CLINICAL DATA: Chest pain, abdominal pain for 3 days, cough

EXAM:
CHEST  2 VIEW

[w chest lat]
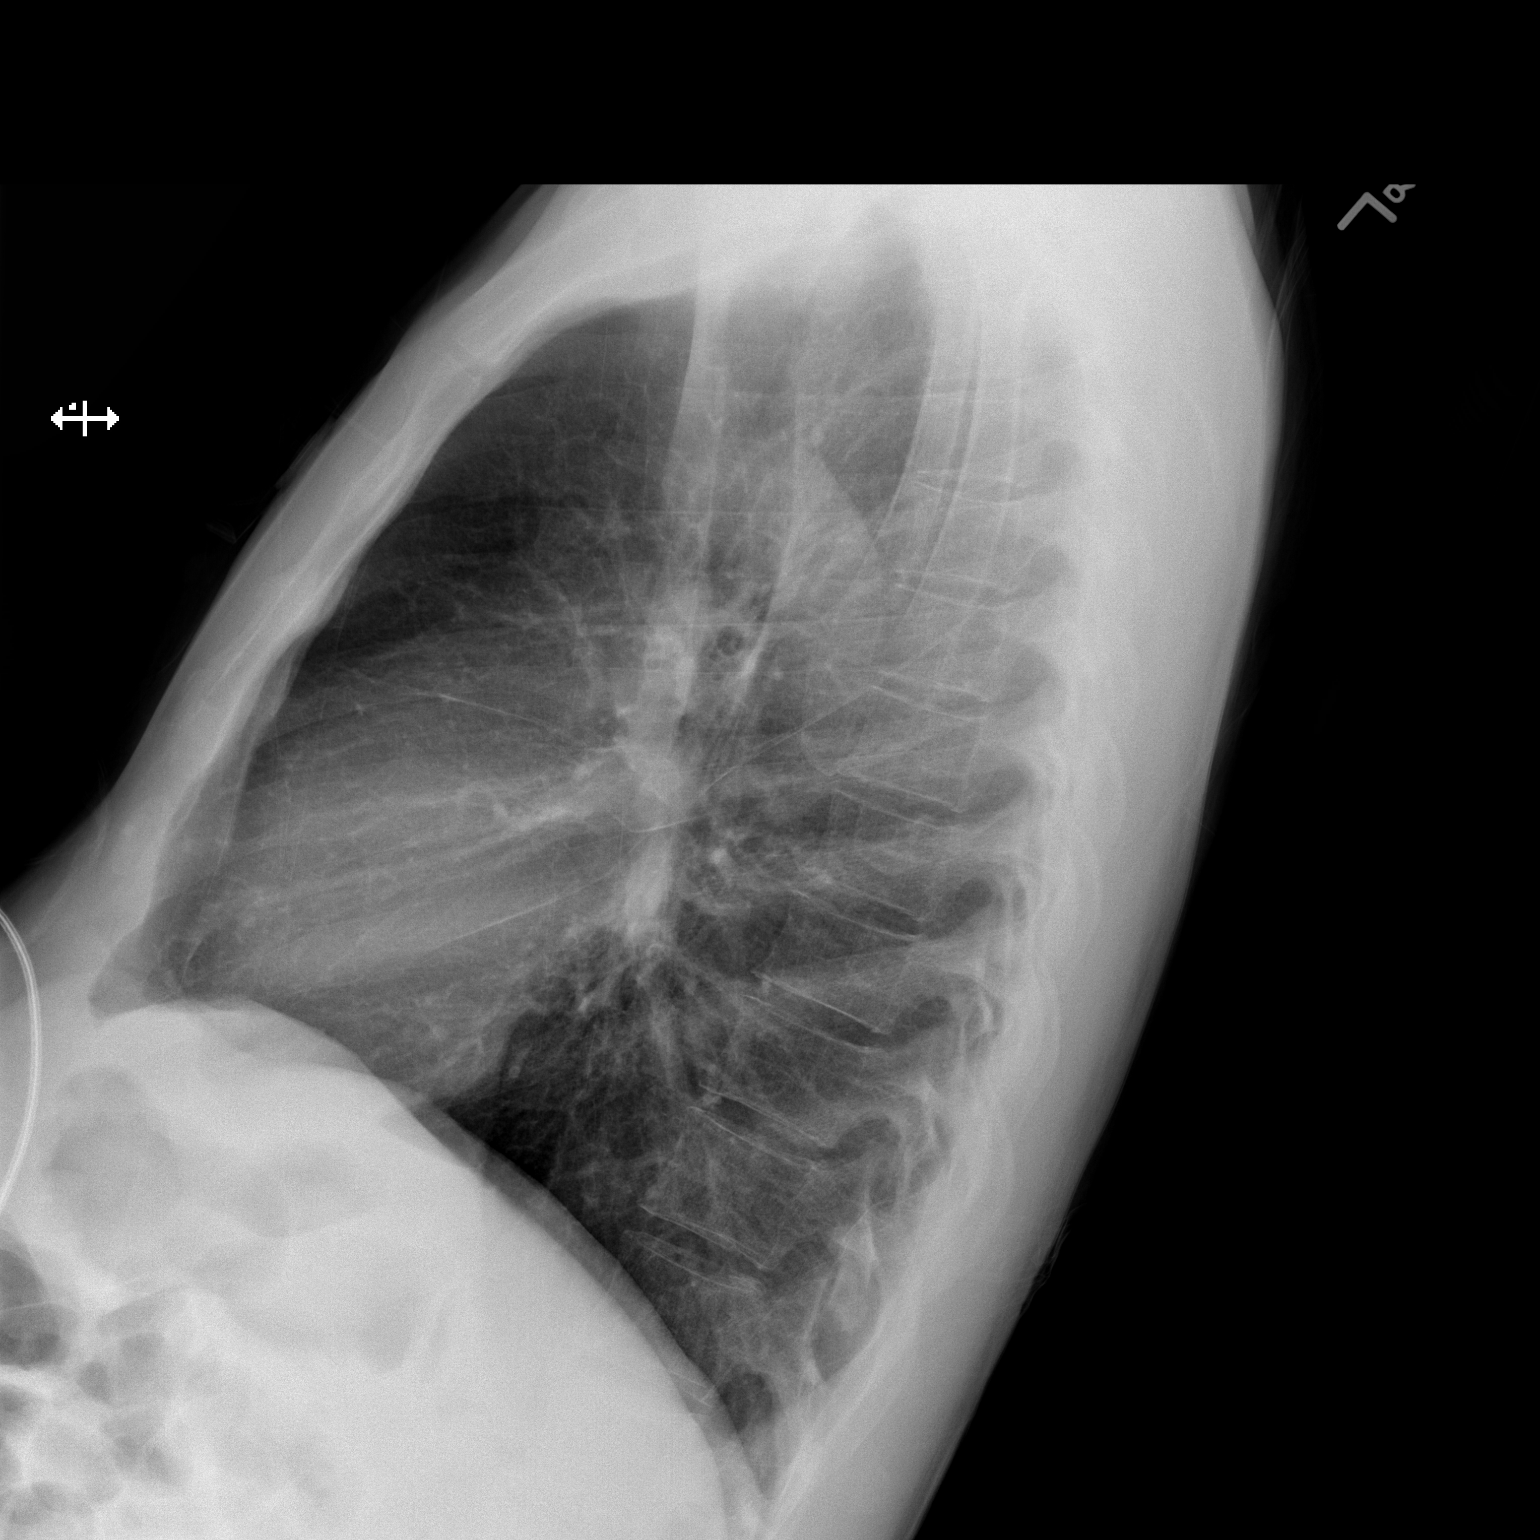

[w chest pa]
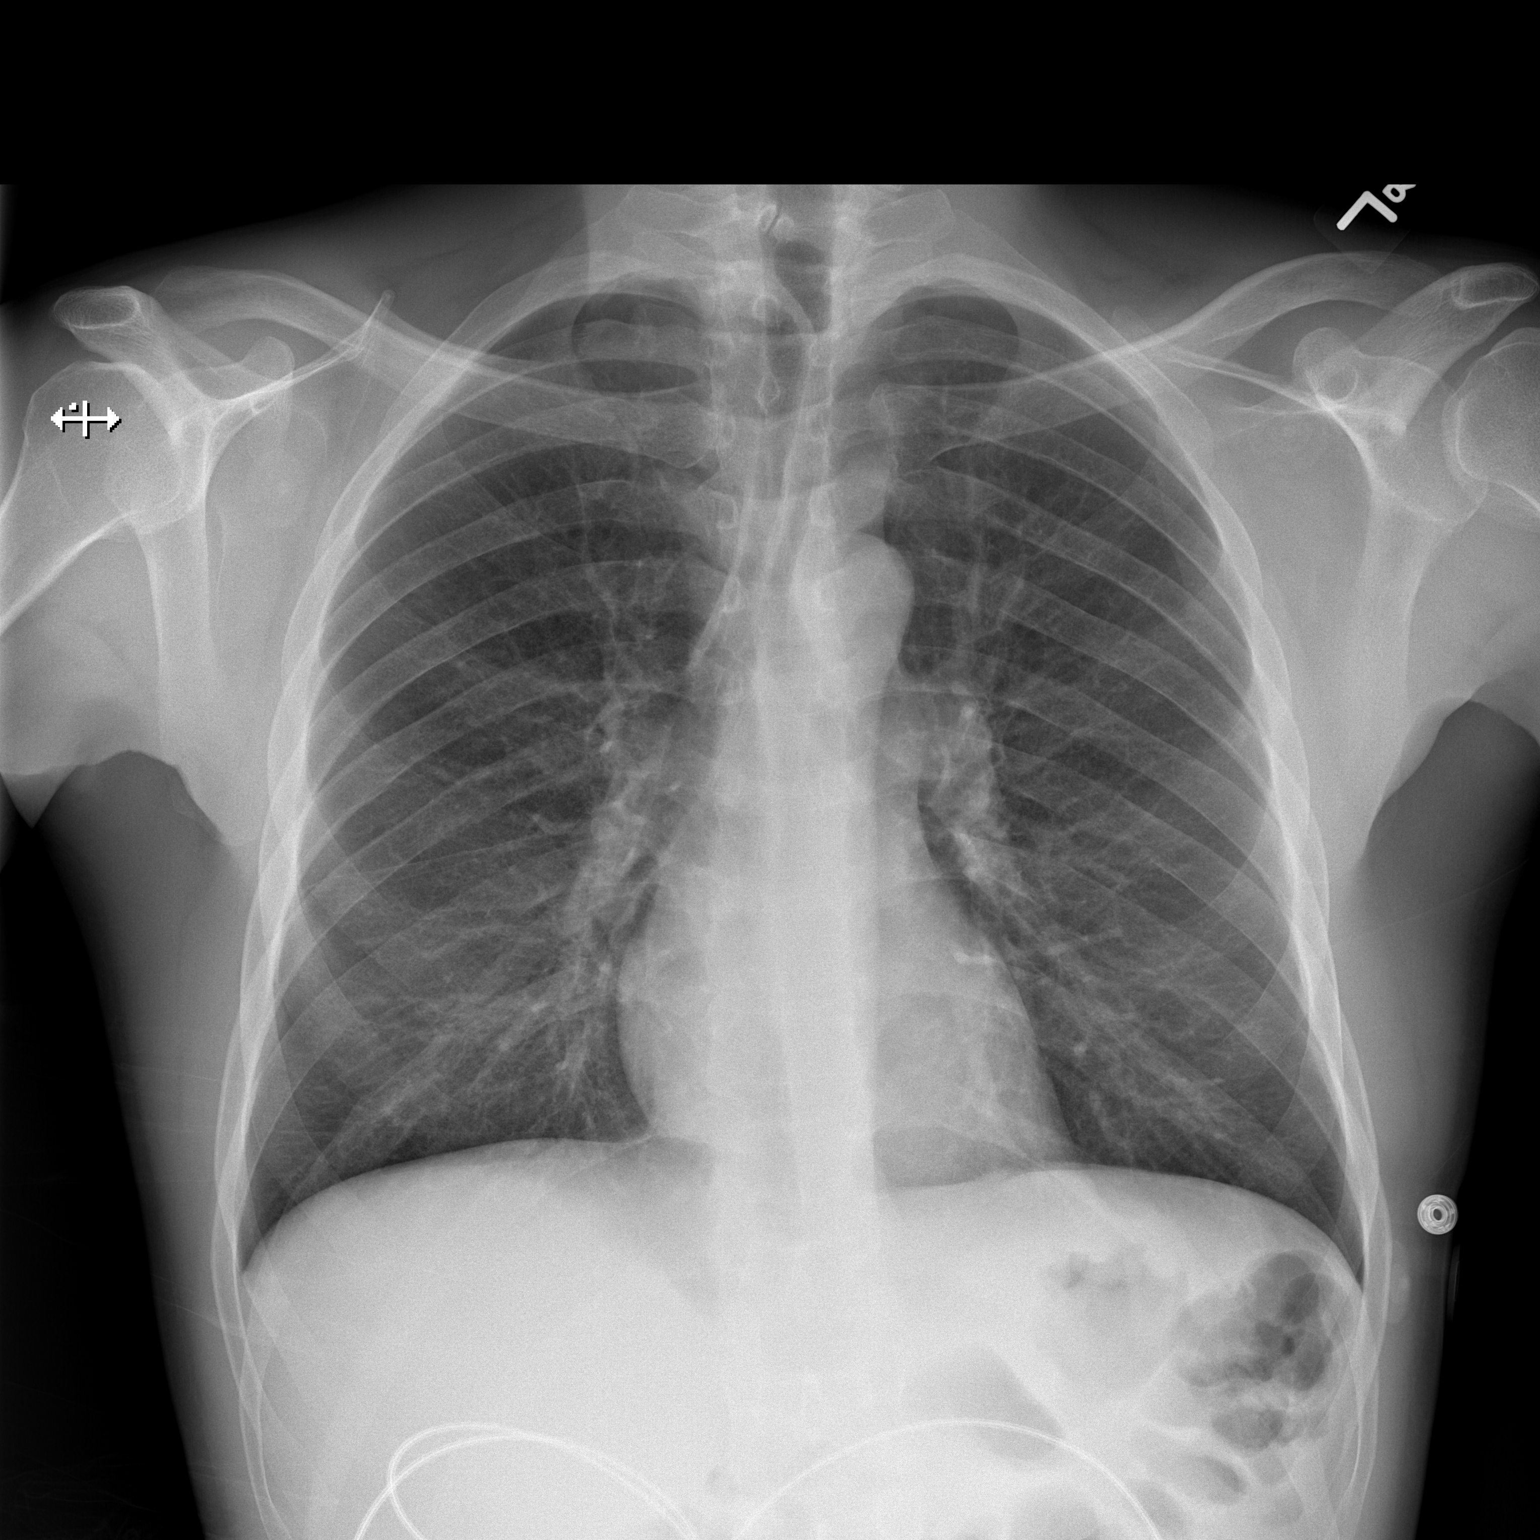

[2 of 2 positions shown; findings below may reference images not displayed]

FINDINGS: Cardiomediastinal silhouette is stable. No acute infiltrate or
pleural effusion. No pulmonary edema. Bony thorax is unremarkable.
IMPRESSION: No active cardiopulmonary disease.

## 2017-08-29 ENCOUNTER — Other Ambulatory Visit: Payer: Self-pay

## 2017-08-29 ENCOUNTER — Encounter (INDEPENDENT_AMBULATORY_CARE_PROVIDER_SITE_OTHER): Payer: Self-pay | Admitting: Physician Assistant

## 2017-08-29 ENCOUNTER — Ambulatory Visit (INDEPENDENT_AMBULATORY_CARE_PROVIDER_SITE_OTHER): Payer: Self-pay | Admitting: Physician Assistant

## 2017-08-29 VITALS — BP 150/98 | HR 96 | Temp 98.1°F | Ht 68.0 in | Wt 134.0 lb

## 2017-08-29 DIAGNOSIS — F1020 Alcohol dependence, uncomplicated: Secondary | ICD-10-CM

## 2017-08-29 DIAGNOSIS — Z09 Encounter for follow-up examination after completed treatment for conditions other than malignant neoplasm: Secondary | ICD-10-CM

## 2017-08-29 DIAGNOSIS — K219 Gastro-esophageal reflux disease without esophagitis: Secondary | ICD-10-CM

## 2017-08-29 DIAGNOSIS — G8929 Other chronic pain: Secondary | ICD-10-CM

## 2017-08-29 DIAGNOSIS — Z760 Encounter for issue of repeat prescription: Secondary | ICD-10-CM

## 2017-08-29 DIAGNOSIS — R109 Unspecified abdominal pain: Secondary | ICD-10-CM

## 2017-08-29 MED ORDER — THIAMINE HCL 100 MG PO TABS: 100.0000 mg | ORAL_TABLET | Freq: Every day | ORAL | 2 refills | Status: DC

## 2017-08-29 MED ORDER — ALBUTEROL SULFATE HFA 108 (90 BASE) MCG/ACT IN AERS: 2.0000 | INHALATION_SPRAY | Freq: Four times a day (QID) | RESPIRATORY_TRACT | 5 refills | Status: DC | PRN

## 2017-08-29 MED ORDER — PANTOPRAZOLE SODIUM 40 MG PO TBEC: 40.0000 mg | DELAYED_RELEASE_TABLET | Freq: Every day | ORAL | 5 refills | Status: DC

## 2017-08-29 MED ORDER — SERTRALINE HCL 100 MG PO TABS: 100.0000 mg | ORAL_TABLET | Freq: Every day | ORAL | 3 refills | Status: DC

## 2017-08-29 MED ORDER — ONDANSETRON HCL 4 MG PO TABS: 4.0000 mg | ORAL_TABLET | Freq: Two times a day (BID) | ORAL | 0 refills | Status: DC

## 2017-08-29 MED ORDER — SUCRALFATE 1 G PO TABS: 1.0000 g | ORAL_TABLET | Freq: Three times a day (TID) | ORAL | 1 refills | Status: DC

## 2017-08-29 MED ORDER — KETOROLAC TROMETHAMINE 60 MG/2ML IM SOLN
60.0000 mg | Freq: Once | INTRAMUSCULAR | Status: AC
  Administered 2017-08-29: 60 mg via INTRAMUSCULAR

## 2017-08-29 MED ORDER — HYDRALAZINE HCL 25 MG PO TABS: 25.0000 mg | ORAL_TABLET | Freq: Three times a day (TID) | ORAL | 5 refills | Status: DC

## 2017-08-29 MED ORDER — METOPROLOL TARTRATE 25 MG PO TABS: 25.0000 mg | ORAL_TABLET | Freq: Two times a day (BID) | ORAL | 5 refills | Status: DC

## 2017-08-29 MED ORDER — LORATADINE 10 MG PO TABS: 10.0000 mg | ORAL_TABLET | Freq: Every day | ORAL | 3 refills | Status: DC

## 2017-08-29 NOTE — Progress Notes (Signed)
Subjective:  Patient ID: [name], male    DOB: [birth year]  Age: 47 y.o. MRN: [id number]  CC: f/u hospital  HPI [name] is a 47 y.o. male with a medical history of alcoholism, anemia, anxiety, asthma, bipolar disorder, chronic bronchitis, cocaine abuse, depression, GERD, HLD, HTN, Marijuana use, migraines, pancreatitis, PTSA, sickle cell trait, and WPW presents as a new patient on ED f/u. ED visit eleven days ago, 08/18/17, for abdominal pain and emesis. Diagnosed with LUQ pain and chronic abdominal pain. Found to have lipase at 68 U/L and stable anemia at 8.8 g/dL. No imaging performed at the time. Treated with Toradol injection and advised to abstain from alcohol. Educated that he can not use the ED for opioid medications. GI f/u recommended.     Says he had begun to drink heavily approximately 3-4 years ago after his girlfriend had an abortion. Drinks approximately 7-8 12 oz beers daily with some liquor in the weekends. Patient currently feels pain in the LLQ with mild nausea. Has loose stools without blood or melena.      Outpatient Medications Prior to Visit  Medication Sig Dispense Refill  . albuterol (PROVENTIL HFA;VENTOLIN HFA) 108 (90 Base) MCG/ACT inhaler Inhale 2 puffs into the lungs every 6 (six) hours as needed for wheezing or shortness of breath.    . famotidine (PEPCID) 20 MG tablet Take 1 tablet (20 mg total) by mouth 2 (two) times daily. 60 tablet 0  . hydrALAZINE (APRESOLINE) 25 MG tablet Take 1 tablet (25 mg total) by mouth every 8 (eight) hours. (Patient taking differently: Take 25 mg by mouth at bedtime. ) 90 tablet 1  . lipase/protease/amylase (CREON) 12000 units CPEP capsule Take 2 capsules (24,000 Units total) by mouth 3 (three) times daily with meals. For pancreatitis 90 capsule 0  . loratadine (CLARITIN) 10 MG tablet Take 1 tablet (10 mg total) by mouth daily. (May purchase from over the counter): For allergies 30 tablet 0  . metoprolol  tartrate (LOPRESSOR) 25 MG tablet Take 1 tablet (25 mg total) by mouth 2 (two) times daily. For high blood pressure 30 tablet 0  . ondansetron (ZOFRAN) 4 MG tablet Take 1 tablet (4 mg total) by mouth every 8 (eight) hours as needed for nausea or vomiting. 12 tablet 0  . pantoprazole (PROTONIX) 40 MG tablet Take 1 tablet (40 mg total) by mouth daily. 30 tablet 1  . sertraline (ZOLOFT) 25 MG tablet Take 3 tablets (75 mg total) by mouth daily. For depression 90 tablet 0  . sucralfate (CARAFATE) 1 g tablet Take 1 tablet (1 g total) by mouth 4 (four) times daily -  with meals and at bedtime. 30 tablet 0  . ondansetron (ZOFRAN ODT) 8 MG disintegrating tablet Take 1 tablet (8 mg total) by mouth every 8 (eight) hours as needed for nausea or vomiting. 20 tablet 0   No facility-administered medications prior to visit.      ROS Review of Systems  Constitutional: Negative for chills, fever and malaise/fatigue.  Eyes: Negative for blurred vision.  Respiratory: Negative for shortness of breath.   Cardiovascular: Negative for chest pain and palpitations.  Gastrointestinal: Positive for abdominal pain. Negative for nausea.  Genitourinary: Negative for dysuria and hematuria.  Musculoskeletal: Negative for joint pain and myalgias.  Skin: Negative for rash.  Neurological: Negative for tingling and headaches.  Psychiatric/Behavioral: Negative for depression. The patient is not nervous/anxious.     Objective:  BP (!) 150/98 (BP Location: Left Arm,  Patient Position: Sitting, Cuff Size: Normal)   Pulse 96   Temp 98.1 F (36.7 C) (Oral)   Ht 5\' 8"  (1.727 m)   Wt 134 lb (60.8 kg)   SpO2 100%   BMI 20.37 kg/m   BP/Weight 08/29/2017 08/18/2017 08/02/7122  Systolic BP 580 998 338  Diastolic BP 98 96 91  Wt. (Lbs) 134 140 -  BMI 20.37 21.29 -  Some encounter information is confidential and restricted. Go to Review Flowsheets activity to see all data.      Physical Exam  Constitutional: He is oriented  to person, place, and time.  thin, NAD but holding right hand over LLQ of abdomen, polite  HENT:  Head: Normocephalic and atraumatic.  Eyes: No scleral icterus.  Neck: Normal range of motion. Neck supple. No thyromegaly present.  Cardiovascular: Normal rate, regular rhythm and normal heart sounds.  Pulmonary/Chest: Effort normal and breath sounds normal.  Abdominal: Soft. Bowel sounds are normal. He exhibits no distension. There is tenderness (generalized TTP in all quadrants with pain worse at the LLQ.).  Musculoskeletal: He exhibits no edema.  Neurological: He is alert and oriented to person, place, and time.  Skin: Skin is warm and dry. No rash noted. No erythema. No pallor.  Psychiatric: He has a normal mood and affect. His behavior is normal. Thought content normal.  Vitals reviewed.    Assessment & Plan:    1. Chronic abdominal pain - Administered ketorolac (TORADOL) injection 60 mg in clinic  2. Gastroesophageal reflux disease, esophagitis presence not specified - Refill sucralfate (CARAFATE) 1 g tablet; Take 1 tablet (1 g total) by mouth 4 (four) times daily -  with meals and at bedtime.  Dispense: 30 tablet; Refill: 1 - refill pantoprazole (PROTONIX) 40 MG tablet; Take 1 tablet (40 mg total) by mouth daily.  Dispense: 30 tablet; Refill: 5 - Will consider sending to GI after CAFA application complete.  3. Alcoholism ([place]) - Contacted Jasmine Lewis LCSW to reach out to patient and facilitate entrance into alcohol rehab program. Pt instructed to go to [facility], alcohol rehab, to establish care. Pt said he will go after clinic visit. He seems motivated to quit drinking alcohol.  - Begin thiamine 100 MG tablet; Take 1 tablet (100 mg total) by mouth daily.  Dispense: 30 tablet; Refill: 2  4. Hospital discharge follow-up - Notes reviewed  5. Medication refill - ondansetron (ZOFRAN) 4 MG tablet; Take 1 tablet (4 mg total) by mouth 2 (two) times daily.  Dispense: 30 tablet; Refill:  0 - metoprolol tartrate (LOPRESSOR) 25 MG tablet; Take 1 tablet (25 mg total) by mouth 2 (two) times daily. For high blood pressure  Dispense: 30 tablet; Refill: 5 - hydrALAZINE (APRESOLINE) 25 MG tablet; Take 1 tablet (25 mg total) by mouth every 8 (eight) hours.  Dispense: 90 tablet; Refill: 5 - albuterol (PROVENTIL HFA;VENTOLIN HFA) 108 (90 Base) MCG/ACT inhaler; Inhale 2 puffs into the lungs every 6 (six) hours as needed for wheezing or shortness of breath.  Dispense: 1 Inhaler; Refill: 5 - loratadine (CLARITIN) 10 MG tablet; Take 1 tablet (10 mg total) by mouth daily. (May purchase from over the counter): For allergies  Dispense: 30 tablet; Refill: 3 - sertraline (ZOLOFT) 100 MG tablet; Take 1 tablet (100 mg total) by mouth daily.  Dispense: 30 tablet; Refill: 3   Meds ordered this encounter  Medications  . ketorolac (TORADOL) injection 60 mg  . thiamine 100 MG tablet    Sig: Take 1 tablet (  100 mg total) by mouth daily.    Dispense:  30 tablet    Refill:  2    Order Specific Question:   Supervising Provider    Answer:   [name] [id number]  . sucralfate (CARAFATE) 1 g tablet    Sig: Take 1 tablet (1 g total) by mouth 4 (four) times daily -  with meals and at bedtime.    Dispense:  30 tablet    Refill:  1    Order Specific Question:   Supervising Provider    Answer:   [name] [id number]  . pantoprazole (PROTONIX) 40 MG tablet    Sig: Take 1 tablet (40 mg total) by mouth daily.    Dispense:  30 tablet    Refill:  5    Order Specific Question:   Supervising Provider    Answer:   [name] [id number]  . ondansetron (ZOFRAN) 4 MG tablet    Sig: Take 1 tablet (4 mg total) by mouth 2 (two) times daily.    Dispense:  30 tablet    Refill:  0    Order Specific Question:   Supervising Provider    Answer:   [name] [id number]  . metoprolol tartrate (LOPRESSOR) 25 MG tablet    Sig: Take 1 tablet (25 mg total) by mouth 2 (two) times daily. For  high blood pressure    Dispense:  30 tablet    Refill:  5    Order Specific Question:   Supervising Provider    Answer:   [name] [id number]  . hydrALAZINE (APRESOLINE) 25 MG tablet    Sig: Take 1 tablet (25 mg total) by mouth every 8 (eight) hours.    Dispense:  90 tablet    Refill:  5    Order Specific Question:   Supervising Provider    Answer:   [name] [id number]  . albuterol (PROVENTIL HFA;VENTOLIN HFA) 108 (90 Base) MCG/ACT inhaler    Sig: Inhale 2 puffs into the lungs every 6 (six) hours as needed for wheezing or shortness of breath.    Dispense:  1 Inhaler    Refill:  5    Order Specific Question:   Supervising Provider    Answer:   [name] [id number]  . loratadine (CLARITIN) 10 MG tablet    Sig: Take 1 tablet (10 mg total) by mouth daily. (May purchase from over the counter): For allergies    Dispense:  30 tablet    Refill:  3    Order Specific Question:   Supervising Provider    Answer:   [name] [id number]  . sertraline (ZOLOFT) 100 MG tablet    Sig: Take 1 tablet (100 mg total) by mouth daily.    Dispense:  30 tablet    Refill:  3    Order Specific Question:   Supervising Provider    Answer:   [name] [id number]    Follow-up: Return in about 1 month (around 09/26/2017) for alcoholism.   Clent Demark PA

## 2017-08-29 NOTE — Patient Instructions (Signed)
Alcohol Intoxication  Alcohol intoxication happens when a person cannot think clearly or function well (becomes impaired) after drinking alcohol. This condition can happen even after one drink. It can be dangerous, especially if:  · The person drank a lot of alcohol in a short amount of time (binge drinking).  · The person also took certain medicines or drugs.    If the intoxication is very bad, a breathing machine (ventilator) may be needed until the alcohol wears off.  Follow these instructions at home:  General instructions   · Do not drive after drinking alcohol.  · Have someone stay with you. You should not be left alone while you have this condition.  · Make sure you have enough fluid in your body. To do this:  ? Drink enough fluid to keep your pee (urine) clear or pale yellow.  ? Avoid caffeine. It can make you thirsty.  · Take over-the-counter and prescription medicines only as told by your doctor.  To prevent this condition:   · Limit alcohol intake to no more than 1 drink a day for nonpregnant women and 2 drinks a day for men. One drink equals 12 oz of beer, 5 oz of wine, or 1½ oz of hard liquor.  · Do not drink alcohol on an empty stomach.  · Avoid drinking alcohol if:  ? You are under the legal drinking age.  ? You are pregnant or may be pregnant.  ? You are taking medicines that you should not take with alcohol.  ? Your drinking causes your medical condition to get worse.  ? You need to drive or do activities that require your attention.  ? You have substance use disorder.  If this condition happens again:   · Get help right away if you or someone you know:  ? Has medium or very bad trouble with:  § Movement (coordination).  § Speech.  § Memory.  § Attention.  ? Passes out (faints).  ? Is confused.  ? Is throwing up (vomiting).  · Do not leave someone with this condition alone.  Contact a doctor if:  · You do not feel better after a few days.  · You are having problems at work, at school, or at home  because of drinking.  Get help right away if:  · You get shaky when you try to stop drinking.  · You start shaking and you cannot control it (have a seizure).  · You throw up blood. The blood may be bright red or look like coffee grounds.  · You see blood in your poop (stool). The blood may:  ? Be bright red.  ? Make your poop black and tarry and make it smell bad.  · You start to feel light-headed.  · You pass out.  If you ever feel like you may hurt yourself or others, or have thoughts about taking your own life, get help right away. You can go to your nearest emergency department or call:  · Your local emergency services (911 in the [place]  · A suicide crisis helpline, such as the National Suicide Prevention Lifeline at 1-800-273-8255. This is open 24 hours a day.    This information is not intended to replace advice given to you by your health care provider. Make sure you discuss any questions you have with your health care provider.  Document Released: 10/12/2007 Document Revised: 01/14/2016 Document Reviewed: 01/14/2016  Elsevier Interactive Patient Education © 2017 [employer]

## 2017-09-01 ENCOUNTER — Telehealth: Payer: Self-pay | Admitting: Licensed Clinical Social Worker

## 2017-09-01 NOTE — Telephone Encounter (Signed)
Call placed to pt. LCSWA introduced self and explained role at [place] disclosed that she received a consult from PA-C Roger to address depression and anxiety.   Pt scheduled appointment to meet with LCSWA for Thursday, May 2, 19 at 10:00 AM.

## 2017-09-04 ENCOUNTER — Emergency Department (HOSPITAL_COMMUNITY): Payer: Self-pay

## 2017-09-04 ENCOUNTER — Inpatient Hospital Stay (HOSPITAL_COMMUNITY)
Admission: EM | Admit: 2017-09-04 | Discharge: 2017-09-06 | DRG: 440 | Disposition: A | Discharge status: Home / Self Care | Payer: Self-pay | Attending: Internal Medicine | Admitting: Internal Medicine

## 2017-09-04 ENCOUNTER — Encounter (HOSPITAL_COMMUNITY): Payer: Self-pay | Admitting: *Deleted

## 2017-09-04 ENCOUNTER — Other Ambulatory Visit: Payer: Self-pay

## 2017-09-04 ENCOUNTER — Telehealth (INDEPENDENT_AMBULATORY_CARE_PROVIDER_SITE_OTHER): Payer: Self-pay

## 2017-09-04 DIAGNOSIS — Z91013 Allergy to seafood: Secondary | ICD-10-CM

## 2017-09-04 DIAGNOSIS — Z8701 Personal history of pneumonia (recurrent): Secondary | ICD-10-CM

## 2017-09-04 DIAGNOSIS — F1210 Cannabis abuse, uncomplicated: Secondary | ICD-10-CM | POA: Diagnosis present

## 2017-09-04 DIAGNOSIS — J42 Unspecified chronic bronchitis: Secondary | ICD-10-CM | POA: Diagnosis present

## 2017-09-04 DIAGNOSIS — K859 Acute pancreatitis without necrosis or infection, unspecified: Principal | ICD-10-CM | POA: Diagnosis present

## 2017-09-04 DIAGNOSIS — R000 Tachycardia, unspecified: Secondary | ICD-10-CM | POA: Diagnosis present

## 2017-09-04 DIAGNOSIS — F1020 Alcohol dependence, uncomplicated: Secondary | ICD-10-CM | POA: Diagnosis present

## 2017-09-04 DIAGNOSIS — M170 Bilateral primary osteoarthritis of knee: Secondary | ICD-10-CM | POA: Diagnosis present

## 2017-09-04 DIAGNOSIS — K861 Other chronic pancreatitis: Secondary | ICD-10-CM | POA: Diagnosis present

## 2017-09-04 DIAGNOSIS — E876 Hypokalemia: Secondary | ICD-10-CM | POA: Diagnosis present

## 2017-09-04 DIAGNOSIS — K219 Gastro-esophageal reflux disease without esophagitis: Secondary | ICD-10-CM | POA: Diagnosis present

## 2017-09-04 DIAGNOSIS — I456 Pre-excitation syndrome: Secondary | ICD-10-CM | POA: Diagnosis present

## 2017-09-04 DIAGNOSIS — F17290 Nicotine dependence, other tobacco product, uncomplicated: Secondary | ICD-10-CM | POA: Diagnosis present

## 2017-09-04 DIAGNOSIS — I10 Essential (primary) hypertension: Secondary | ICD-10-CM | POA: Diagnosis present

## 2017-09-04 DIAGNOSIS — R51 Headache: Secondary | ICD-10-CM | POA: Diagnosis present

## 2017-09-04 DIAGNOSIS — F319 Bipolar disorder, unspecified: Secondary | ICD-10-CM | POA: Diagnosis present

## 2017-09-04 DIAGNOSIS — F4310 Post-traumatic stress disorder, unspecified: Secondary | ICD-10-CM | POA: Diagnosis present

## 2017-09-04 DIAGNOSIS — G8929 Other chronic pain: Secondary | ICD-10-CM | POA: Diagnosis present

## 2017-09-04 DIAGNOSIS — M545 Low back pain: Secondary | ICD-10-CM | POA: Diagnosis present

## 2017-09-04 DIAGNOSIS — Z91041 Radiographic dye allergy status: Secondary | ICD-10-CM

## 2017-09-04 DIAGNOSIS — F1910 Other psychoactive substance abuse, uncomplicated: Secondary | ICD-10-CM | POA: Diagnosis present

## 2017-09-04 DIAGNOSIS — Z811 Family history of alcohol abuse and dependence: Secondary | ICD-10-CM

## 2017-09-04 DIAGNOSIS — Z888 Allergy status to other drugs, medicaments and biological substances status: Secondary | ICD-10-CM

## 2017-09-04 DIAGNOSIS — D573 Sickle-cell trait: Secondary | ICD-10-CM | POA: Diagnosis present

## 2017-09-04 DIAGNOSIS — Z79899 Other long term (current) drug therapy: Secondary | ICD-10-CM

## 2017-09-04 DIAGNOSIS — E785 Hyperlipidemia, unspecified: Secondary | ICD-10-CM | POA: Diagnosis present

## 2017-09-04 DIAGNOSIS — K2970 Gastritis, unspecified, without bleeding: Secondary | ICD-10-CM | POA: Diagnosis present

## 2017-09-04 DIAGNOSIS — F17200 Nicotine dependence, unspecified, uncomplicated: Secondary | ICD-10-CM | POA: Diagnosis present

## 2017-09-04 DIAGNOSIS — Z808 Family history of malignant neoplasm of other organs or systems: Secondary | ICD-10-CM

## 2017-09-04 DIAGNOSIS — D649 Anemia, unspecified: Secondary | ICD-10-CM | POA: Diagnosis present

## 2017-09-04 DIAGNOSIS — Z8711 Personal history of peptic ulcer disease: Secondary | ICD-10-CM

## 2017-09-04 DIAGNOSIS — J45909 Unspecified asthma, uncomplicated: Secondary | ICD-10-CM | POA: Diagnosis present

## 2017-09-04 DIAGNOSIS — K860 Alcohol-induced chronic pancreatitis: Secondary | ICD-10-CM | POA: Diagnosis present

## 2017-09-04 DIAGNOSIS — R748 Abnormal levels of other serum enzymes: Secondary | ICD-10-CM | POA: Diagnosis present

## 2017-09-04 DIAGNOSIS — Z720 Tobacco use: Secondary | ICD-10-CM | POA: Diagnosis present

## 2017-09-04 DIAGNOSIS — Z791 Long term (current) use of non-steroidal anti-inflammatories (NSAID): Secondary | ICD-10-CM

## 2017-09-04 DIAGNOSIS — F419 Anxiety disorder, unspecified: Secondary | ICD-10-CM | POA: Diagnosis present

## 2017-09-04 DIAGNOSIS — F1410 Cocaine abuse, uncomplicated: Secondary | ICD-10-CM | POA: Diagnosis present

## 2017-09-04 DIAGNOSIS — R079 Chest pain, unspecified: Secondary | ICD-10-CM | POA: Diagnosis present

## 2017-09-04 DIAGNOSIS — F17210 Nicotine dependence, cigarettes, uncomplicated: Secondary | ICD-10-CM | POA: Diagnosis present

## 2017-09-04 DIAGNOSIS — Z8249 Family history of ischemic heart disease and other diseases of the circulatory system: Secondary | ICD-10-CM

## 2017-09-04 DIAGNOSIS — E7800 Pure hypercholesterolemia, unspecified: Secondary | ICD-10-CM | POA: Diagnosis present

## 2017-09-04 DIAGNOSIS — Z8781 Personal history of (healed) traumatic fracture: Secondary | ICD-10-CM

## 2017-09-04 HISTORY — DX: Other chronic pancreatitis: K86.1

## 2017-09-04 LAB — URINALYSIS, ROUTINE W REFLEX MICROSCOPIC
BILIRUBIN URINE: NEGATIVE
Glucose, UA: NEGATIVE mg/dL
HGB URINE DIPSTICK: NEGATIVE
KETONES UR: NEGATIVE mg/dL
Leukocytes, UA: NEGATIVE
Nitrite: NEGATIVE
Protein, ur: NEGATIVE mg/dL
SPECIFIC GRAVITY, URINE: 1.003 — AB (ref 1.005–1.030)
pH: 6 (ref 5.0–8.0)

## 2017-09-04 LAB — I-STAT TROPONIN, ED: TROPONIN I, POC: 0 ng/mL (ref 0.00–0.08)

## 2017-09-04 LAB — COMPREHENSIVE METABOLIC PANEL
ALBUMIN: 4 g/dL (ref 3.5–5.0)
ALT: 22 U/L (ref 17–63)
AST: 36 U/L (ref 15–41)
Alkaline Phosphatase: 76 U/L (ref 38–126)
Anion gap: 12 (ref 5–15)
BUN: 12 mg/dL (ref 6–20)
CHLORIDE: 103 mmol/L (ref 101–111)
CO2: 23 mmol/L (ref 22–32)
CREATININE: 0.73 mg/dL (ref 0.61–1.24)
Calcium: 9 mg/dL (ref 8.9–10.3)
GFR calc Af Amer: >60 mL/min (ref >60)
GFR calc non Af Amer: >60 mL/min (ref >60)
Glucose, Bld: 102 mg/dL — ABNORMAL HIGH (ref 65–99)
POTASSIUM: 3.3 mmol/L — AB (ref 3.5–5.1)
SODIUM: 138 mmol/L (ref 135–145)
Total Bilirubin: 0.4 mg/dL (ref 0.3–1.2)
Total Protein: 7.7 g/dL (ref 6.5–8.1)

## 2017-09-04 LAB — CBC
HEMATOCRIT: 31.1 % — AB (ref 39.0–52.0)
Hemoglobin: 9.6 g/dL — ABNORMAL LOW (ref 13.0–17.0)
MCH: 25 pg — AB (ref 26.0–34.0)
MCHC: 30.9 g/dL (ref 30.0–36.0)
MCV: 81 fL (ref 78.0–100.0)
PLATELETS: 232 10*3/uL (ref 150–400)
RBC: 3.84 MIL/uL — ABNORMAL LOW (ref 4.22–5.81)
RDW: 18.1 % — AB (ref 11.5–15.5)
WBC: 11.7 10*3/uL — ABNORMAL HIGH (ref 4.0–10.5)

## 2017-09-04 LAB — LIPASE, BLOOD: LIPASE: 160 U/L — AB (ref 11–51)

## 2017-09-04 LAB — TROPONIN I

## 2017-09-04 MED ORDER — FAMOTIDINE IN NACL 20-0.9 MG/50ML-% IV SOLN
20.0000 mg | Freq: Two times a day (BID) | INTRAVENOUS | Status: DC
  Administered 2017-09-04 – 2017-09-05 (×3): 20 mg via INTRAVENOUS
  Filled 2017-09-04 (×4): qty 50

## 2017-09-04 MED ORDER — LORAZEPAM 2 MG/ML IJ SOLN
1.0000 mg | Freq: Four times a day (QID) | INTRAMUSCULAR | Status: DC | PRN
  Administered 2017-09-05 – 2017-09-06 (×4): 1 mg via INTRAVENOUS
  Filled 2017-09-04 (×4): qty 1

## 2017-09-04 MED ORDER — FAMOTIDINE IN NACL 20-0.9 MG/50ML-% IV SOLN
20.0000 mg | Freq: Once | INTRAVENOUS | Status: AC
  Administered 2017-09-04: 20 mg via INTRAVENOUS
  Filled 2017-09-04: qty 50

## 2017-09-04 MED ORDER — MORPHINE SULFATE (PF) 4 MG/ML IV SOLN
4.0000 mg | Freq: Once | INTRAVENOUS | Status: AC
  Administered 2017-09-04: 4 mg via INTRAVENOUS
  Filled 2017-09-04: qty 1

## 2017-09-04 MED ORDER — SERTRALINE HCL 100 MG PO TABS
100.0000 mg | ORAL_TABLET | Freq: Every day | ORAL | Status: DC
  Administered 2017-09-05: 100 mg via ORAL
  Filled 2017-09-04 (×2): qty 1

## 2017-09-04 MED ORDER — LACTATED RINGERS IV SOLN
INTRAVENOUS | Status: DC
  Administered 2017-09-04 – 2017-09-06 (×7): via INTRAVENOUS

## 2017-09-04 MED ORDER — ACETAMINOPHEN 325 MG PO TABS
650.0000 mg | ORAL_TABLET | Freq: Four times a day (QID) | ORAL | Status: DC | PRN
  Administered 2017-09-05 (×2): 650 mg via ORAL
  Filled 2017-09-04 (×2): qty 2

## 2017-09-04 MED ORDER — ADULT MULTIVITAMIN W/MINERALS CH
1.0000 | ORAL_TABLET | Freq: Every day | ORAL | Status: DC
  Administered 2017-09-04 – 2017-09-05 (×2): 1 via ORAL
  Filled 2017-09-04 (×2): qty 1

## 2017-09-04 MED ORDER — KETOROLAC TROMETHAMINE 15 MG/ML IJ SOLN
7.5000 mg | Freq: Once | INTRAMUSCULAR | Status: AC
  Administered 2017-09-04: 7.5 mg via INTRAVENOUS
  Filled 2017-09-04: qty 1

## 2017-09-04 MED ORDER — ONDANSETRON HCL 4 MG/2ML IJ SOLN
4.0000 mg | Freq: Once | INTRAMUSCULAR | Status: AC
  Administered 2017-09-04: 4 mg via INTRAVENOUS
  Filled 2017-09-04: qty 2

## 2017-09-04 MED ORDER — TRAMADOL HCL 50 MG PO TABS
50.0000 mg | ORAL_TABLET | Freq: Four times a day (QID) | ORAL | Status: DC | PRN
  Administered 2017-09-04 – 2017-09-05 (×4): 50 mg via ORAL
  Filled 2017-09-04 (×4): qty 1

## 2017-09-04 MED ORDER — GI COCKTAIL ~~LOC~~
30.0000 mL | Freq: Once | ORAL | Status: AC
  Administered 2017-09-04: 30 mL via ORAL
  Filled 2017-09-04: qty 30

## 2017-09-04 MED ORDER — METOPROLOL TARTRATE 25 MG PO TABS
25.0000 mg | ORAL_TABLET | Freq: Two times a day (BID) | ORAL | Status: DC
  Administered 2017-09-04 – 2017-09-05 (×3): 25 mg via ORAL
  Filled 2017-09-04 (×3): qty 1

## 2017-09-04 MED ORDER — ONDANSETRON HCL 4 MG PO TABS: 4.0000 mg | ORAL_TABLET | Freq: Four times a day (QID) | ORAL | Status: DC | PRN

## 2017-09-04 MED ORDER — ACETAMINOPHEN 650 MG RE SUPP: 650.0000 mg | Freq: Four times a day (QID) | RECTAL | Status: DC | PRN

## 2017-09-04 MED ORDER — LORAZEPAM 1 MG PO TABS
1.0000 mg | ORAL_TABLET | Freq: Four times a day (QID) | ORAL | Status: DC | PRN
  Administered 2017-09-04 – 2017-09-05 (×2): 1 mg via ORAL
  Filled 2017-09-04 (×3): qty 1

## 2017-09-04 MED ORDER — LORAZEPAM 2 MG/ML IJ SOLN
0.5000 mg | Freq: Once | INTRAMUSCULAR | Status: AC
  Administered 2017-09-04: 0.5 mg via INTRAVENOUS
  Filled 2017-09-04: qty 1

## 2017-09-04 MED ORDER — ONDANSETRON HCL 4 MG/2ML IJ SOLN: 4.0000 mg | Freq: Four times a day (QID) | INTRAMUSCULAR | Status: DC | PRN

## 2017-09-04 MED ORDER — FOLIC ACID 1 MG PO TABS
1.0000 mg | ORAL_TABLET | Freq: Every day | ORAL | Status: DC
  Administered 2017-09-04 – 2017-09-05 (×2): 1 mg via ORAL
  Filled 2017-09-04 (×2): qty 1

## 2017-09-04 MED ORDER — ALBUTEROL SULFATE HFA 108 (90 BASE) MCG/ACT IN AERS: 2.0000 | INHALATION_SPRAY | Freq: Four times a day (QID) | RESPIRATORY_TRACT | Status: DC | PRN

## 2017-09-04 MED ORDER — VITAMIN B-1 100 MG PO TABS
100.0000 mg | ORAL_TABLET | Freq: Every day | ORAL | Status: DC
  Administered 2017-09-04 – 2017-09-05 (×2): 100 mg via ORAL
  Filled 2017-09-04 (×2): qty 1

## 2017-09-04 MED ORDER — HYDRALAZINE HCL 20 MG/ML IJ SOLN: 5.0000 mg | INTRAMUSCULAR | Status: DC | PRN

## 2017-09-04 MED ORDER — ENOXAPARIN SODIUM 40 MG/0.4ML ~~LOC~~ SOLN
40.0000 mg | SUBCUTANEOUS | Status: DC
  Administered 2017-09-04 – 2017-09-05 (×2): 40 mg via SUBCUTANEOUS
  Filled 2017-09-04 (×3): qty 0.4

## 2017-09-04 MED ORDER — SODIUM CHLORIDE 0.9 % IV BOLUS
1000.0000 mL | Freq: Once | INTRAVENOUS | Status: AC
  Administered 2017-09-04: 1000 mL via INTRAVENOUS

## 2017-09-04 MED ORDER — THIAMINE HCL 100 MG/ML IJ SOLN: 100.0000 mg | Freq: Every day | INTRAMUSCULAR | Status: DC

## 2017-09-04 NOTE — Telephone Encounter (Signed)
No opioid pain killer for patient with alcoholism. We discussed the need for him to go to rehab as the best treatment for his alcoholism and related chronic pancreatitis.

## 2017-09-04 NOTE — H&P (Signed)
History and Physical    [name] NTZ:[id number] DOB: [birth year] DOA: 09/04/2017  PCP: Clent Demark, PA-C - saw him last week for the first time Consultants:  Trying to get an EGD but can't afford to have this done; [place] - allergy Patient coming from:  Home - lives with mother, grandmother; Donald Prose: mother, [phone]  Chief Complaint: abdominal pain  HPI: [name] is a 47 y.o. male with medical history significant of h/o chronic pancreatitis;  ETOH/cocaine dependence; WPW; HTN; marijuana abuse; HLD; and bipolar d/o presenting with abdominal pain.   He has chronic ETOH dependence and pancreatitis.  Last ETOH was Friday night.  He ate fried chicken yesterday and it started hurting.  He already had some abdominal pain - taking Aleve, Carafate, and Prevacid but it wasn't helping with the pain.  He dealt with it until 3-4am and he came in here.  He is unable to tolerate PO intake.  He does drink about a 6-pack daily.  Now other things are causing abdominal pain - like chocolate and ice cream, not just alcohol.  He has been taking Aleve every 8 hours since yesterday.  He is interested in quitting drinking.    He has had 28(!) ER visits in the last 6 months and 2 admissions in the last 6 months.  ED Course:   Pain c/w chronic pancreatitis, unable to tolerate PO.    Review of Systems: As per HPI; otherwise review of systems reviewed and negative.   Ambulatory Status:  Ambulates without assistance  Past Medical History:  Diagnosis Date  . Alcoholism /alcohol abuse ([place])   . Anemia   . Anxiety   . Arthritis    "knees; arms; elbows" (03/26/2015)  . Asthma   . Bipolar disorder ([place])   . Chronic bronchitis ([place])   . Chronic lower back pain   . Chronic pancreatitis ([place])   . Cocaine abuse ([place])   . Depression   . Family history of adverse reaction to anesthesia    "grandmother gets confused"  . Femoral condyle fracture ([place]) 03/08/2014   left medial/notes  03/09/2014  . GERD (gastroesophageal reflux disease)   . H/O hiatal hernia   . H/O suicide attempt 10/2012  . Heart murmur    "when he was little" (03/06/2013)  . High cholesterol   . History of blood transfusion 10/2012   "when I tried to commit suicide"  . History of stomach ulcers   . Hypertension   . Marijuana abuse, continuous   . Migraine    "a few times/year" (03/26/2015)  . Pneumonia 1990's X 3  . PTSD (post-traumatic stress disorder)   . Shortness of breath    "can happen at anytime" (03/06/2013)  . Sickle cell trait ([place])   . WPW (Wolff-Parkinson-White syndrome)    [name] 03/06/2013    Past Surgical History:  Procedure Laterality Date  . CARDIAC CATHETERIZATION    . EYE SURGERY Left 1990's   "result of trauma"   . FACIAL FRACTURE SURGERY Left 1990's   "result of trauma"   . FRACTURE SURGERY    . HERNIA REPAIR    . LEFT HEART CATHETERIZATION WITH CORONARY ANGIOGRAM Right 03/07/2013   Procedure: LEFT HEART CATHETERIZATION WITH CORONARY ANGIOGRAM;  Surgeon: [name], MD;  Location: [facility];  Service: Cardiovascular;  Laterality: Right;  . UMBILICAL HERNIA REPAIR      Social History   Socioeconomic History  . Marital status: Single    Spouse name: Not  on file  . Number of children: Not on file  . Years of education: Not on file  . Highest education level: Not on file  Occupational History  . Occupation: disabled  Social Needs  . Financial resource strain: Not on file  . Food insecurity:    Worry: Not on file    Inability: Not on file  . Transportation needs:    Medical: Not on file    Non-medical: Not on file  Tobacco Use  . Smoking status: Current Every Day Smoker    Packs/day: 1.00    Years: 33.00    Pack years: 33.00    Types: Cigarettes, E-cigarettes  . Smokeless tobacco: Never Used  Substance and Sexual Activity  . Alcohol use: Yes  . Drug use: Yes    Types: Marijuana, Cocaine    Comment: daily marijuana use; last cocaine use about 3  months ago  . Sexual activity: Yes    Birth control/protection: None  Lifestyle  . Physical activity:    Days per week: Not on file    Minutes per session: Not on file  . Stress: Not on file  Relationships  . Social connections:    Talks on phone: Not on file    Gets together: Not on file    Attends religious service: Not on file    Active member of club or organization: Not on file    Attends meetings of clubs or organizations: Not on file    Relationship status: Not on file  . Intimate partner violence:    Fear of current or ex partner: Not on file    Emotionally abused: Not on file    Physically abused: Not on file    Forced sexual activity: Not on file  Other Topics Concern  . Not on file  Social History Narrative   ** Merged History Encounter **        Allergies  Allergen Reactions  . Robaxin [Methocarbamol] Other (See Comments)    "jumpy limbs"  . Shellfish-Derived Products Nausea And Vomiting  . Trazodone And Nefazodone Other (See Comments)    Reaction:  Muscle spasms  . Contrast Media [Iodinated Diagnostic Agents] Hives  . Reglan [Metoclopramide] Other (See Comments)    Reaction:  Muscle spasms    Family History  Problem Relation Age of Onset  . Hypertension Mother   . Cirrhosis Mother   . Alcoholism Mother   . Hypertension Father   . Melanoma Father   . Hypertension Other   . Coronary artery disease Other     Prior to Admission medications   Medication Sig Start Date End Date Taking? Authorizing Provider  albuterol (PROVENTIL HFA;VENTOLIN HFA) 108 (90 Base) MCG/ACT inhaler Inhale 2 puffs into the lungs every 6 (six) hours as needed for wheezing or shortness of breath. 08/29/17  Yes Clent Demark, PA-C  hydrALAZINE (APRESOLINE) 25 MG tablet Take 1 tablet (25 mg total) by mouth every 8 (eight) hours. 08/29/17  Yes Clent Demark, PA-C  lipase/protease/amylase (CREON) 12000 units CPEP capsule Take 2 capsules (24,000 Units total) by mouth 3 (three)  times daily with meals. For pancreatitis 06/06/17  Yes [name], MD  loratadine (CLARITIN) 10 MG tablet Take 1 tablet (10 mg total) by mouth daily. (May purchase from over the counter): For allergies 08/29/17  Yes Clent Demark, PA-C  metoprolol tartrate (LOPRESSOR) 25 MG tablet Take 1 tablet (25 mg total) by mouth 2 (two) times daily. For high blood pressure 08/29/17  Yes Clent Demark, PA-C  naproxen sodium (ALEVE) 220 MG tablet Take 220 mg by mouth 2 (two) times daily as needed.   Yes [provider]  ondansetron (ZOFRAN) 4 MG tablet Take 1 tablet (4 mg total) by mouth 2 (two) times daily. 08/29/17  Yes Clent Demark, PA-C  pantoprazole (PROTONIX) 40 MG tablet Take 1 tablet (40 mg total) by mouth daily. 08/29/17  Yes Clent Demark, PA-C  sertraline (ZOLOFT) 100 MG tablet Take 1 tablet (100 mg total) by mouth daily. 08/29/17  Yes Clent Demark, PA-C  sucralfate (CARAFATE) 1 g tablet Take 1 tablet (1 g total) by mouth 4 (four) times daily -  with meals and at bedtime. 08/29/17  Yes Clent Demark, PA-C  thiamine 100 MG tablet Take 1 tablet (100 mg total) by mouth daily. 08/29/17  Yes Clent Demark, PA-C  omeprazole (PRILOSEC) 20 MG capsule Take 1 capsule (20 mg total) daily by mouth. 03/22/17 06/21/17  Varney Biles, MD    Physical Exam: Vitals:   09/04/17 1130 09/04/17 1200 09/04/17 1230 09/04/17 1330  BP: (!) 170/94 135/89 (!) 153/90 140/89  Pulse: 95 88  (!) 102  Resp:      Temp:      TempSrc:      SpO2: 100% 100%  100%     General:  Appears calm and comfortable and is NAD; when I initially entered the room, he was not present - he was actually standing at the nursing station requesting pain medication and chatting. Eyes:  PERRL, EOMI, normal lids, iris ENT:  grossly normal hearing, lips & tongue, mmm; poor dentition Neck:  no LAD, masses or thyromegaly Cardiovascular:  RRR, no m/r/g. No LE edema.  Respiratory:   CTA bilaterally with no  wheezes/rales/rhonchi.  Normal respiratory effort. Abdomen:  soft, NT, ND, NABS Skin:  no rash or induration seen on limited exam Musculoskeletal:  grossly normal tone BUE/BLE, good ROM, no bony abnormality Lower extremity:  No LE edema.  Limited foot exam with no ulcerations.  2+ distal pulses. Psychiatric:  grossly normal mood and affect, speech fluent and appropriate, AOx3 Neurologic:  CN 2-12 grossly intact, moves all extremities in coordinated fashion, sensation intact    Radiological Exams on Admission: Dg Chest 2 View  Result Date: 09/04/2017 CLINICAL DATA:  Mid chest pain, epigastric pain EXAM: CHEST - 2 VIEW COMPARISON:  07/05/2017 FINDINGS: Heart and mediastinal contours are within normal limits. No focal opacities or effusions. No acute bony abnormality. IMPRESSION: No active cardiopulmonary disease. Electronically Signed   By: Rolm Baptise M.D.   On: 09/04/2017 12:31    EKG: Independently reviewed.  NSR with rate 93; nonspecific ST changes with no evidence of acute ischemia; NSCSLT   Labs on Admission: I have personally reviewed the available labs and imaging studies at the time of the admission.  Pertinent labs:   K+ 3.3 Glucose 102 Lipase 160, prior 68 on 4/12 Troponin negative x 2 WBC 11.7, prior 11.9 on 4/12 Hgb 9.6, prior 8.8 on 4/12  Assessment/Plan Principal Problem:   Acute on chronic pancreatitis (HCC) Active Problems:   TOBACCO ABUSE   Benign essential HTN   Polysubstance abuse (tobacco, cocaine, THC, and ETOH)   Alcohol use disorder, severe, dependence ([place])   Acute on chronic pancreatitis -Patient with chronic alcoholic pancreatitis presenting with abdominal pain He has minimal elevation in lipase and appears quite comfortable despite repeatedly asking for IV pain medication -The most likely etiology is alcohol ingestion  causing mild pancreatitic irritation and/or gastritis, also in the setting of NSAID use -Will observe overnight with plan for IVF,  non-opiate pain control, CIWA monitoring, and placement in a long-term substance abuse treatment program -Strict NPO for now -Aggressive IVF hydration at least for the first 12 hours with LR at 200 cc/hr -Pain control with Tramadol.  I have discussed at length with the patient that narcotic pain control is not beneficial and can actually be harmful in the setting of chronic pancreatitis.  I have also carefully explained that while the short-term discomfort may be present with this plan, the long-term benefits of abstaining from opiates as well as ETOH and drugs will well be worth it. -Nausea control with Zofran -IV Pepcid BID for possible gastritis component -He has shown a pattern of inappropriate ER utilization and needs long-term substance abuse treatment to have substantial improvement in his long-term health outcomes. -He will need medication assistance to be able to afford Creon.  Polysubstance abuse -He is not currently exhibiting s/sx of withdrawal -If he is factual about last ETOH use last Friday, then the window for withdrawal is closing -Will place on CIWA protocol with the goal for him to receive ORAL BZD if possible without escalation of BZD unless his CIWA score accurately portrays active withdrawal -UDS ordered -I have spoken with the patient and his mother and strongly recommended long-term substance abuse treatment in a halfway house-type program and have provided them with the name and number of [facility].   -SW consult also requested to attempt to facilitate placement in a long-term treatment program at the time of d/c - likely tomorrow or 5/1. -He will need medication assistance but the program he discharges to may be affiliated with a community clinic and thus be able to facilitate medication assistance for him once he is enrolled. -Continue Zoloft  HTN -Hold PO hydralazine and use IV instead -Continue PO Lopressor    DVT prophylaxis:  Lovenox Code  Status: Full - confirmed with patient/family Family Communication: Mother present throughout entire encounter Disposition Plan:  Home once clinically improved Consults called: SW  Admission status: It is my clinical opinion that referral for OBSERVATION is reasonable and necessary in this patient based on the above information provided. The aforementioned taken together are felt to place the patient at high risk for further clinical deterioration. However it is anticipated that the patient may be medically stable for discharge from the hospital within 24 to 48 hours.     Karmen Bongo MD [facility]  If note is complete, please contact covering daytime or nighttime physician. [email or web address] Password [email or web address]  09/04/2017, 5:00 PM

## 2017-09-04 NOTE — ED Triage Notes (Addendum)
Pt arrived by EMS c/o abdominal pain after eating fried chicken. Denies alcohol use for the past 2 days. Also c/o chest pain. Pt took aleve and carafate pta. EMS gave 4mg  zofran

## 2017-09-04 NOTE — ED Provider Notes (Signed)
[facility] EMERGENCY DEPARTMENT Provider Note   CSN: [id number] Arrival date & time: 09/04/17  0205     History   Chief Complaint Chief Complaint  Patient presents with  . Pancreatitis    HPI Engelbert Lysle Yero is a 47 y.o. male.  HPI   Patient is a 46 year old male with history of chronic pancreatitis, alcoholism, anemia, bipolar disorder, GERD, HLD, HTN, who presents to the ED complaining of exacerbation of his chronic pancreatitis that began last night around 6 PM.  States he began having abdominal pain to the epigastric and left periumbilical area that is constant in nature.  He denies any dysuria urgency, frequency.  He is tried taking Tylenol and Aleve at home.  He also tried taking fair Carafate with no improvement of his symptoms.  Of note patient is also complaining of intermittent stabbing chest pain that has been ongoing since 3 AM.  States pain is 5/10.  No symptoms currently.  No associated shortness of breath or URI symptoms.  No diaphoresis, dizziness, lightheadedness.  Not associated with exertion.  States that it feels similar to "indigestion".  Of note patient is a chronic alcohol user and states he drinks a sixpack per day.  States he has not had alcohol in 48 hours.  He feels tremulous and anxious.  He has a mild "sinus headache ".  And feels that he is "seeing spots".  Dates that he did not take any of his morning meds including his metoprolol today.  Past Medical History:  Diagnosis Date  . Alcoholism /alcohol abuse ([place])   . Anemia   . Anxiety   . Arthritis    "knees; arms; elbows" (03/26/2015)  . Asthma   . Bipolar disorder ([employer])   . Chronic bronchitis ([place])   . Chronic lower back pain   . Chronic pancreatitis ([place])   . Cocaine abuse ([place])   . Depression   . Family history of adverse reaction to anesthesia    "grandmother gets confused"  . Femoral condyle fracture ([place]) 03/08/2014   left medial/notes 03/09/2014  . GERD  (gastroesophageal reflux disease)   . H/O hiatal hernia   . H/O suicide attempt 10/2012  . Heart murmur    "when he was little" (03/06/2013)  . High cholesterol   . History of blood transfusion 10/2012   "when I tried to commit suicide"  . History of stomach ulcers   . Hypertension   . Marijuana abuse, continuous   . Migraine    "a few times/year" (03/26/2015)  . Pneumonia 1990's X 3  . PTSD (post-traumatic stress disorder)   . Shortness of breath    "can happen at anytime" (03/06/2013)  . Sickle cell trait ([place])   . WPW (Wolff-Parkinson-White syndrome)    [name] 03/06/2013    Patient Active Problem List   Diagnosis Date Noted  . Abdominal pain 05/27/2017  . Hematemesis 05/27/2017  . Tachycardia 03/18/2017  . Nausea & vomiting 03/18/2017  . Diarrhea 03/18/2017  . Acute on chronic pancreatitis ([place]) 12/17/2016  . Chronic alcoholic pancreatitis ([place]) 12/05/2016  . Intractable nausea and vomiting 12/05/2016  . Verbally abusive behavior 12/05/2016  . Normocytic anemia 12/05/2016  . Alcohol use disorder, severe, dependence ([place]) 07/25/2016  . Cocaine use disorder, severe, dependence ([place]) 07/25/2016  . Major depressive disorder, recurrent severe without psychotic features ([place]) 07/20/2016  . Leukocytosis   . Hospital acquired PNA 05/20/2015  . Pancreatitis 05/18/2015  . Pseudocyst of pancreas 05/18/2015  . Polysubstance abuse (  tobacco, cocaine, THC, and ETOH) 03/26/2015  . Alcohol-induced chronic pancreatitis ([place])   . Benign essential HTN 02/06/2014  . Alcohol-induced acute pancreatitis 11/28/2013  . Pancreatic pseudocyst/cyst 11/25/2013  . MDD (major depressive disorder), recurrent severe, without psychosis ([place]) 10/22/2013  . Severe protein-calorie malnutrition ([place]) 10/10/2013  . Suicide attempt ([place]) 10/08/2013  . 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 TOBACCO ABUSE 03/23/2007    Past Surgical History:  Procedure Laterality Date  .  CARDIAC CATHETERIZATION    . EYE SURGERY Left 1990's   "result of trauma"   . FACIAL FRACTURE SURGERY Left 1990's   "result of trauma"   . FRACTURE SURGERY    . HERNIA REPAIR    . LEFT HEART CATHETERIZATION WITH CORONARY ANGIOGRAM Right 03/07/2013   Procedure: LEFT HEART CATHETERIZATION WITH CORONARY ANGIOGRAM;  Surgeon: [name], MD;  Location: [facility];  Service: Cardiovascular;  Laterality: Right;  . UMBILICAL HERNIA REPAIR          Home Medications    Prior to Admission medications   Medication Sig Start Date End Date Taking? Authorizing Provider  albuterol (PROVENTIL HFA;VENTOLIN HFA) 108 (90 Base) MCG/ACT inhaler Inhale 2 puffs into the lungs every 6 (six) hours as needed for wheezing or shortness of breath. 08/29/17  Yes Clent Demark, PA-C  hydrALAZINE (APRESOLINE) 25 MG tablet Take 1 tablet (25 mg total) by mouth every 8 (eight) hours. 08/29/17  Yes Clent Demark, PA-C  lipase/protease/amylase (CREON) 12000 units CPEP capsule Take 2 capsules (24,000 Units total) by mouth 3 (three) times daily with meals. For pancreatitis 06/06/17  Yes [name], MD  loratadine (CLARITIN) 10 MG tablet Take 1 tablet (10 mg total) by mouth daily. (May purchase from over the counter): For allergies 08/29/17  Yes Clent Demark, PA-C  metoprolol tartrate (LOPRESSOR) 25 MG tablet Take 1 tablet (25 mg total) by mouth 2 (two) times daily. For high blood pressure 08/29/17  Yes Clent Demark, PA-C  ondansetron (ZOFRAN) 4 MG tablet Take 1 tablet (4 mg total) by mouth 2 (two) times daily. 08/29/17  Yes Clent Demark, PA-C  pantoprazole (PROTONIX) 40 MG tablet Take 1 tablet (40 mg total) by mouth daily. 08/29/17  Yes Clent Demark, PA-C  sertraline (ZOLOFT) 100 MG tablet Take 1 tablet (100 mg total) by mouth daily. 08/29/17  Yes Clent Demark, PA-C  sucralfate (CARAFATE) 1 g tablet Take 1 tablet (1 g total) by mouth 4 (four) times daily -  with meals and at bedtime.  08/29/17  Yes Clent Demark, PA-C  thiamine 100 MG tablet Take 1 tablet (100 mg total) by mouth daily. 08/29/17  Yes Clent Demark, PA-C  omeprazole (PRILOSEC) 20 MG capsule Take 1 capsule (20 mg total) daily by mouth. 03/22/17 06/21/17  Varney Biles, MD    Family History Family History  Problem Relation Age of Onset  . Hypertension Mother   . Cirrhosis Mother   . Alcoholism Mother   . Hypertension Father   . Melanoma Father   . Hypertension Other   . Coronary artery disease Other     Social History Social History   Tobacco Use  . Smoking status: Current Every Day Smoker    Packs/day: 1.00    Years: 33.00    Pack years: 33.00    Types: Cigarettes, E-cigarettes  . Smokeless tobacco: Never Used  Substance Use Topics  . Alcohol use: Yes  . Drug use: Yes  Types: Marijuana, Cocaine    Comment: daily marijuana use; last cocaine use about 3 months ago     Allergies   Robaxin [methocarbamol]; Shellfish-derived products; Trazodone and nefazodone; Contrast media [iodinated diagnostic agents]; and Reglan [metoclopramide]   Review of Systems Review of Systems  Constitutional: Negative for chills and fever.  HENT: Negative for ear pain and sore throat.   Eyes: Negative for pain and visual disturbance.  Respiratory: Negative for cough and shortness of breath.   Cardiovascular: Positive for chest pain. Negative for palpitations and leg swelling.  Gastrointestinal: Positive for abdominal pain, diarrhea, nausea and vomiting. Negative for blood in stool and constipation.  Genitourinary: Negative for dysuria, frequency and hematuria.  Musculoskeletal: Negative for back pain and neck pain.  Skin: Negative for color change and rash.  Neurological: Positive for headaches. Negative for dizziness, weakness, light-headedness and numbness.  All other systems reviewed and are negative.    Physical Exam Updated Vital Signs BP 140/89   Pulse (!) 102   Temp 98.2 F (36.8 C)  (Oral)   Resp (!) 21   SpO2 100%   Physical Exam  Constitutional: He is oriented to person, place, and time. He appears well-developed and well-nourished.  HENT:  Head: Normocephalic and atraumatic.  Mouth/Throat: Oropharynx is clear and moist.  Eyes: Pupils are equal, round, and reactive to light. Conjunctivae and EOM are normal. No scleral icterus.  Neck: Neck supple.  Cardiovascular: Normal rate, regular rhythm, normal heart sounds and intact distal pulses.  No murmur heard. Pulmonary/Chest: Effort normal and breath sounds normal. No respiratory distress. He has no wheezes.  Abdominal: Soft. He exhibits no distension.  ttp to epigastric and left periumbilical abdomen without guarding, rebound or rigidity  Musculoskeletal: He exhibits no edema.  No calf TTP, erythema, swelling.  Neurological: He is alert and oriented to person, place, and time.  Skin: Skin is warm and dry. Capillary refill takes less than 2 seconds.  Psychiatric: He has a normal mood and affect.  Nursing note and vitals reviewed.    ED Treatments / Results  Labs (all labs ordered are listed, but only abnormal results are displayed) Labs Reviewed  LIPASE, BLOOD - Abnormal; Notable for the following components:      Result Value   Lipase 160 (*)    All other components within normal limits  COMPREHENSIVE METABOLIC PANEL - Abnormal; Notable for the following components:   Potassium 3.3 (*)    Glucose, Bld 102 (*)    All other components within normal limits  CBC - Abnormal; Notable for the following components:   WBC 11.7 (*)    RBC 3.84 (*)    Hemoglobin 9.6 (*)    HCT 31.1 (*)    MCH 25.0 (*)    RDW 18.1 (*)    All other components within normal limits  URINALYSIS, ROUTINE W REFLEX MICROSCOPIC - Abnormal; Notable for the following components:   Color, Urine STRAW (*)    Specific Gravity, Urine 1.003 (*)    All other components within normal limits  TROPONIN I  I-STAT TROPONIN, ED  I-STAT TROPONIN,  ED    EKG EKG Interpretation  Date/Time:  Monday September 04 2017 02:12:19 EDT Ventricular Rate:  93 PR Interval:  110 QRS Duration: 84 QT Interval:  324 QTC Calculation: 402 R Axis:   77 Text Interpretation:  Sinus rhythm with short PR Biatrial enlargement T wave abnormality, consider inferior ischemia T wave abnormality, consider anterolateral ischemia Abnormal ECG No significant change since  last tracing Confirmed by Ripley Fraise [phone]) on 09/04/2017 2:19:23 AM   Radiology Dg Chest 2 View  Result Date: 09/04/2017 CLINICAL DATA:  Mid chest pain, epigastric pain EXAM: CHEST - 2 VIEW COMPARISON:  07/05/2017 FINDINGS: Heart and mediastinal contours are within normal limits. No focal opacities or effusions. No acute bony abnormality. IMPRESSION: No active cardiopulmonary disease. Electronically Signed   By: Rolm Baptise M.D.   On: 09/04/2017 12:31    Procedures Procedures (including critical care time)  Medications Ordered in ED Medications  traMADol (ULTRAM) tablet 50 mg (has no administration in time range)  sodium chloride 0.9 % bolus 1,000 mL (0 mLs Intravenous Stopped 09/04/17 1342)  gi cocktail (Maalox,Lidocaine,Donnatal) (30 mLs Oral Given 09/04/17 1113)  ondansetron (ZOFRAN) injection 4 mg (4 mg Intravenous Given 09/04/17 1114)  famotidine (PEPCID) IVPB 20 mg premix (0 mg Intravenous Stopped 09/04/17 1143)  ketorolac (TORADOL) 15 MG/ML injection 7.5 mg (7.5 mg Intravenous Given 09/04/17 1114)  morphine 4 MG/ML injection 4 mg (4 mg Intravenous Given 09/04/17 1321)  LORazepam (ATIVAN) injection 0.5 mg (0.5 mg Intravenous Given 09/04/17 1321)     Initial Impression / Assessment and Plan / ED Course  I have reviewed the triage vital signs and the nursing notes.  Pertinent labs & imaging results that were available during my care of the patient were reviewed by me and considered in my medical decision making (see chart for details).  Discussed pt presentation and exam findings  with Dr. Sherry Ruffing, who personally evaluated the pt and agrees with the plan to admit pt.  Reevaluated patient after GI cocktail, fluids, Zofran, Pepcid, and Toradol.  He is still in a significant amount of pain and is requesting additional pain medications.  Reevaluated patient after he had received morphine and Zofran.  States he is still in pain and is not able to tolerate fluids.  He feels that he will not be able to go home since he cannot tolerate fluids.  He feels that he needs to be admitted to the hospital.  Upon entering the room for reevaluation patient had emesis bag full of chicken broth that he states he vomited up.  Informed him of plan for admission and he is agreeable to the plan.  2:28 PM CONSULT with hospitalist, Dr. Lorin Mercy who will admit the pt to the hospitalist service.  Final Clinical Impressions(s) / ED Diagnoses   Final diagnoses:  Acute on chronic pancreatitis ([place])   47 year old male presenting for abdominal pain that feels like his past pancreatitis.  Initially slightly tachycardic and hypertensive.  Afebrile with normal respirations and O2 sats.   Patient has epigastric abdominal tenderness.  Symptoms do appear to be consistent with his chronic pancreatitis.  Do not feel that any additional acute abdominal pathology is occurring at this time.  Patient given pain meds, fluids, Zofran, GI cocktail and was unable to tolerate fluids on the ED.  Still complaining of significant pain and feels that he needs to be admitted for IV hydration and pain control.  CBC elevated white blood cell count 11.7 which appears consistent with patient's past labs.  Hemoglobin 9.6 which is improved from prior.  CMP with mild hypokalemia but otherwise normal creatinine and liver enzymes.  Troponin negative.  UA still pending.  Lipase elevated to 160.  Patient will require admission for IV hydration given he is unable to tolerate fluids during his current pancreatitis flare.  Hospitalist  consulted and will admit the patient to their service.  ED Discharge Orders  None       Bishop Dublin 09/04/17 1642    Tegeler, Gwenyth Allegra, MD 09/04/17 2017

## 2017-09-04 NOTE — Telephone Encounter (Signed)
Patient called requesting a Rx for gabapentin or tramadol (something for pain) since they will not be prescribing him oxycodone. Patient would like Rx sent to [facility]. Patient also wanted to inform PCP that he is currently in the hospital with lipase levels of 160, he was given a shot of morphine/ativan, a GI cocktail and famotidine and acid. Nat Christen, CMA

## 2017-09-04 NOTE — ED Notes (Signed)
Patient transported to X-ray 

## 2017-09-05 ENCOUNTER — Encounter (HOSPITAL_COMMUNITY): Payer: Self-pay

## 2017-09-05 ENCOUNTER — Other Ambulatory Visit: Payer: Self-pay

## 2017-09-05 ENCOUNTER — Encounter (HOSPITAL_COMMUNITY): Payer: Self-pay | Admitting: General Practice

## 2017-09-05 LAB — COMPREHENSIVE METABOLIC PANEL
ALBUMIN: 3.3 g/dL — AB (ref 3.5–5.0)
ALK PHOS: 66 U/L (ref 38–126)
ALT: 15 U/L — ABNORMAL LOW (ref 17–63)
ANION GAP: 8 (ref 5–15)
AST: 20 U/L (ref 15–41)
BUN: 5 mg/dL — ABNORMAL LOW (ref 6–20)
CALCIUM: 8.7 mg/dL — AB (ref 8.9–10.3)
CHLORIDE: 103 mmol/L (ref 101–111)
CO2: 25 mmol/L (ref 22–32)
Creatinine, Ser: 0.63 mg/dL (ref 0.61–1.24)
GFR calc Af Amer: >60 mL/min (ref >60)
GFR calc non Af Amer: >60 mL/min (ref >60)
GLUCOSE: 74 mg/dL (ref 65–99)
POTASSIUM: 3.8 mmol/L (ref 3.5–5.1)
SODIUM: 136 mmol/L (ref 135–145)
Total Bilirubin: 0.8 mg/dL (ref 0.3–1.2)
Total Protein: 6.4 g/dL — ABNORMAL LOW (ref 6.5–8.1)

## 2017-09-05 LAB — RAPID URINE DRUG SCREEN, HOSP PERFORMED
AMPHETAMINES: NOT DETECTED
BARBITURATES: NOT DETECTED
Benzodiazepines: NOT DETECTED
Cocaine: NOT DETECTED
Opiates: NOT DETECTED
TETRAHYDROCANNABINOL: POSITIVE — AB

## 2017-09-05 LAB — CBC
HEMATOCRIT: 26.3 % — AB (ref 39.0–52.0)
HEMOGLOBIN: 8 g/dL — AB (ref 13.0–17.0)
MCH: 24.9 pg — AB (ref 26.0–34.0)
MCHC: 30.4 g/dL (ref 30.0–36.0)
MCV: 81.9 fL (ref 78.0–100.0)
Platelets: 185 10*3/uL (ref 150–400)
RBC: 3.21 MIL/uL — ABNORMAL LOW (ref 4.22–5.81)
RDW: 18.2 % — ABNORMAL HIGH (ref 11.5–15.5)
WBC: 10.4 10*3/uL (ref 4.0–10.5)

## 2017-09-05 LAB — LIPASE, BLOOD: Lipase: 36 U/L (ref 11–51)

## 2017-09-05 MED ORDER — ALBUTEROL SULFATE (2.5 MG/3ML) 0.083% IN NEBU: 2.5000 mg | INHALATION_SOLUTION | Freq: Four times a day (QID) | RESPIRATORY_TRACT | Status: DC | PRN

## 2017-09-05 MED ORDER — NICOTINE 21 MG/24HR TD PT24
21.0000 mg | MEDICATED_PATCH | Freq: Every day | TRANSDERMAL | Status: DC
  Administered 2017-09-05: 21 mg via TRANSDERMAL
  Filled 2017-09-05: qty 1

## 2017-09-05 MED ORDER — LOPERAMIDE HCL 2 MG PO CAPS
4.0000 mg | ORAL_CAPSULE | Freq: Once | ORAL | Status: AC
  Administered 2017-09-05: 4 mg via ORAL
  Filled 2017-09-05: qty 2

## 2017-09-05 MED ORDER — MORPHINE SULFATE (PF) 4 MG/ML IV SOLN
1.0000 mg | INTRAVENOUS | Status: DC | PRN
  Administered 2017-09-05 – 2017-09-06 (×4): 1 mg via INTRAVENOUS
  Filled 2017-09-05 (×5): qty 1

## 2017-09-05 NOTE — ED Notes (Signed)
Patient asked for social worker to come see him 'Immediately". When asked for a reason patient stated "That's none of your business".

## 2017-09-05 NOTE — ED Notes (Signed)
Patient transported to [facility] by RN.

## 2017-09-05 NOTE — ED Notes (Signed)
Patient found walking the hallway. Patient refused to tell RN his name and then went back to his room. Patient stated this was "his room and you need to get out". Explained to patient I was his RN and patient stated he is in "terrible pain and I am seeing spots everywhere". Patient then stated "I need to call someone, get out". Patient refused to talk further with this RN and said "Just take me up to my room".

## 2017-09-05 NOTE — Telephone Encounter (Signed)
Left message asking patient to call office. Tempestt S Roberts, CMA  

## 2017-09-05 NOTE — Progress Notes (Signed)
PROGRESS NOTE    [name]  KGU:[id number] DOB: 1971/03/20 DOA: 09/04/2017 PCP: Clent Demark, PA-C  Outpatient Specialists:    Brief Narrative: Patient is a 47 year old [name] with past medical history significant for chronic pancreatitis;  ETOH/cocaine dependence; WPW; HTN; marijuana abuse; HLD; and bipolar d/o.  Patient was admitted with abdominal pain.  Patient has chronic ETOH dependence and pancreatitis.  According to the patient, he ate fried chicken the day prior to admission and developed abdominal pain.  Patient was also taking a significant amount of NSAIDs prior to admission (Aleve).  Patient has had had 28(!) ER visits in the last 6 months and 2 admissions in the last 6 months.  Patient has continued to report abdominal pain, and is asking for IV morphine.  Patient was seen alongside patient's mother   Assessment & Plan:   Principal Problem:   Acute on chronic pancreatitis ([place]) Active Problems:   TOBACCO ABUSE   Benign essential HTN   Polysubstance abuse (tobacco, cocaine, THC, and ETOH)   Alcohol use disorder, severe, dependence ([place])   Acute on chronic pancreatitis: -supportive care -IV Pepcid BID for possible gastritis component -Pain control. -As per prior documentation, patient will need medication assistance to be able to afford Creon.  Polysubstance abuse: -Counseled. -No withdrawal signs for now. -On CIWA protocol with the goal for him to receive ORAL BZD if possible without escalation of BZD unless his CIWA score accurately portrays active withdrawal -UDS is positive for tetrahydrocannabinol  -Long-term substance abuse treatment in a halfway house-type program recommended for the patient.   -SW consult also requested to attempt to facilitate placement in a long-term treatment program at the time of d/c. -He will need medication assistance but the program he discharges to may be affiliated with a community clinic and thus be  able to facilitate medication assistance for him once he is enrolled. -Continue Zoloft  HTN -Optimize  DVT prophylaxis:  Lovenox Code Status: Full - confirmed with patient/family Family Communication: Mother present throughout entire encounter Disposition Plan:  Home once clinically improved Consults called: SW   Procedures:   None  Antimicrobials:   None   Subjective: Patient continues to report abdominal pain.  Patient has kept asking for morphine.  No fever or chills, no nausea vomiting, no shortness of breath or chest pain.    Objective: Vitals:   09/05/17 0700 09/05/17 0851 09/05/17 0853 09/05/17 1500  BP: 124/88 129/80 129/80 (!) 139/92  Pulse: 91 87 87 98  Resp: 18  15   Temp: 97.9 F (36.6 C)  97.9 F (36.6 C)   TempSrc: Oral  Oral   SpO2: 99%  100%     Intake/Output Summary (Last 24 hours) at 09/05/2017 1527 Last data filed at 09/05/2017 6283 Gross per 24 hour  Intake 2290 ml  Output -  Net 2290 ml   There were no vitals filed for this visit.  Examination:  General exam: Appears calm and comfortable  Respiratory system: Clear to auscultation. Respiratory effort normal. Cardiovascular system: S1 & S2 heard.  No pedal edema. Gastrointestinal system: Abdomen is nondistended, soft and nontender. No organomegaly or masses felt. Normal bowel sounds heard. Central nervous system: Alert and oriented. No focal neurological deficits. Extremities: Symmetric 5 x 5 power.   Data Reviewed: I have personally reviewed following labs and imaging studies  CBC: Recent Labs  Lab 09/04/17 0218 09/05/17 0828  WBC 11.7* 10.4  HGB 9.6* 8.0*  HCT 31.1* 26.3*  MCV 81.0  81.9  PLT 232 854   Basic Metabolic Panel: Recent Labs  Lab 09/04/17 0218 09/05/17 0828  NA 138 136  K 3.3* 3.8  CL 103 103  CO2 23 25  GLUCOSE 102* 74  BUN 12 5*  CREATININE 0.73 0.63  CALCIUM 9.0 8.7*   GFR: Estimated Creatinine Clearance: 97.1 mL/min (by C-G formula based on SCr of  0.63 mg/dL). Liver Function Tests: Recent Labs  Lab 09/04/17 0218 09/05/17 0828  AST 36 20  ALT 22 15*  ALKPHOS 76 66  BILITOT 0.4 0.8  PROT 7.7 6.4*  ALBUMIN 4.0 3.3*   Recent Labs  Lab 09/04/17 0218  LIPASE 160*   No results for input(s): AMMONIA in the last 168 hours. Coagulation Profile: No results for input(s): INR, PROTIME in the last 168 hours. Cardiac Enzymes: Recent Labs  Lab 09/04/17 0218  TROPONINI <0.03   BNP (last 3 results) No results for input(s): PROBNP in the last 8760 hours. HbA1C: No results for input(s): HGBA1C in the last 72 hours. CBG: No results for input(s): GLUCAP in the last 168 hours. Lipid Profile: No results for input(s): CHOL, HDL, LDLCALC, TRIG, CHOLHDL, LDLDIRECT in the last 72 hours. Thyroid Function Tests: No results for input(s): TSH, T4TOTAL, FREET4, T3FREE, THYROIDAB in the last 72 hours. Anemia Panel: No results for input(s): VITAMINB12, FOLATE, FERRITIN, TIBC, IRON, RETICCTPCT in the last 72 hours. Urine analysis:    Component Value Date/Time   COLORURINE STRAW (A) 09/04/2017 1427   APPEARANCEUR CLEAR 09/04/2017 1427   LABSPEC 1.003 (L) 09/04/2017 1427   PHURINE 6.0 09/04/2017 1427   GLUCOSEU NEGATIVE 09/04/2017 1427   HGBUR NEGATIVE 09/04/2017 1427   HGBUR negative 04/30/2010 1020   BILIRUBINUR NEGATIVE 09/04/2017 1427   KETONESUR NEGATIVE 09/04/2017 1427   PROTEINUR NEGATIVE 09/04/2017 1427   UROBILINOGEN 0.2 03/15/2015 0508   NITRITE NEGATIVE 09/04/2017 1427   LEUKOCYTESUR NEGATIVE 09/04/2017 1427   Sepsis Labs: @LABRCNTIP (procalcitonin:4,lacticidven:4)  )No results found for this or any previous visit (from the past 240 hour(s)).       Radiology Studies: Dg Chest 2 View  Result Date: 09/04/2017 CLINICAL DATA:  Mid chest pain, epigastric pain EXAM: CHEST - 2 VIEW COMPARISON:  07/05/2017 FINDINGS: Heart and mediastinal contours are within normal limits. No focal opacities or effusions. No acute bony  abnormality. IMPRESSION: No active cardiopulmonary disease. Electronically Signed   By: Rolm Baptise M.D.   On: 09/04/2017 12:31        Scheduled Meds: . enoxaparin (LOVENOX) injection  40 mg Subcutaneous Q24H  . folic acid  1 mg Oral Daily  . metoprolol tartrate  25 mg Oral BID  . multivitamin with minerals  1 tablet Oral Daily  . nicotine  21 mg Transdermal Daily  . sertraline  100 mg Oral Daily  . thiamine  100 mg Oral Daily   Or  . thiamine  100 mg Intravenous Daily   Continuous Infusions: . famotidine (PEPCID) IV Stopped (09/05/17 0953)  . lactated ringers 200 mL/hr at 09/05/17 1445     LOS: 0 days    Time spent: 35 minutes    Dana Allan, MD  [facility] Pager #: [phone] 7PM-7AM contact night coverage as above

## 2017-09-05 NOTE — Social Work (Addendum)
CSW acknowledging consult- pt has been seen by CSW's during previous admissions and given resources regarding ETOH/Substance Use. Upon assessment, pt is interested in [facility] in [facility] for further substance use treatment.   Pt states that he has a Nurse, adult Mariel Kansky through [facility] that he would like a note faxed to that he is currently hospitalized. Public Defender information is [phone] (telephone) [phone] (fax).   CSW has called and left a message for public defender to return. CSW also called there is a 2-3 week wait list for [facility], CSW can complete assessment form and pt can come to complete assessment tomorrow by 9am if he so wishes.   CSW to f/u with pt regarding [facility] information.   CSW also acknowledging consult for Medication Assistance, for this pt has been previously seen by RN Case Manager and CSW. Pt is connected with Marliss Coots, NP, at the [facility] for medication support and his PCP is Domenica Fail at [facility], pt is also scheduled to have an appointment with a CSW at [facility] on 5/2.   2:55pm- Pt information faxed to [employer] and [employer]. Pt referral form also sent through to [facility] in [place]. Pt able to discharge tomorrow prior to 9am (8am preferred if possible) in order to make assessment appointment at [facility]. Pt aware of 2-3 week waitlist for inpatient treatment, but would be open to outpatient services. CSW aware that inpatient treatment programs locally are almost all operating on waitlist currently.   Pt had questions about [facility] f/u and pain clinic referral, CSW passed this on to RN Case Manager. Pt states no further questions. Cab voucher given to Unit RN Director for cab to be called tomorrow morning at 8:00am for pt to get to [place] for in person assessment.   CSW signing off. Please consult if any additional needs arise.  Alexander Mt, [place] Work [phone]

## 2017-09-05 NOTE — Care Management Note (Addendum)
Case Management Note  Patient Details  Name: [name] MRN: [id number] Date of Birth: [birth year]  Subjective/Objective:                    Action/Plan:See previous CM  And SW notes.  Patient asked to see CM , for CM to refer him to [facility]. CM explained to patient and his mother at bedside that these referrals are initialized by MD ( PCP). He stated Dr Altamease Oiler told him , he "didn't need pain management clinic". Patient has appointment with Dr Altamease Oiler office on Sep 18, 2017 and also has up coming appointment with Sw at [facility]. Patient aware and states he use to go [facility] and he plans to return to [facility] .  Expected Discharge Date:                  Expected Discharge Plan:  Home/Self Care  In-House Referral:     Discharge planning Services  CM Consult  Post Acute Care Choice:    Choice offered to:  Patient, Parent  DME Arranged:  N/A DME Agency:  NA  HH Arranged:  NA HH Agency:  NA  Status of Service:  In process, will continue to follow  If discussed at Long Length of Stay Meetings, dates discussed:    Additional Comments:  Marilu Favre, RN 09/05/2017, 2:44 PM

## 2017-09-06 ENCOUNTER — Encounter (HOSPITAL_COMMUNITY): Payer: Self-pay

## 2017-09-06 DIAGNOSIS — K861 Other chronic pancreatitis: Secondary | ICD-10-CM

## 2017-09-06 DIAGNOSIS — K859 Acute pancreatitis without necrosis or infection, unspecified: Principal | ICD-10-CM

## 2017-09-06 LAB — LIPASE, BLOOD: Lipase: 32 U/L (ref 11–51)

## 2017-09-06 MED ORDER — FOLIC ACID 1 MG PO TABS: 1.0000 mg | ORAL_TABLET | Freq: Every day | ORAL | 0 refills | Status: DC

## 2017-09-06 MED ORDER — ADULT MULTIVITAMIN W/MINERALS CH: 1.0000 | ORAL_TABLET | Freq: Every day | ORAL | 0 refills | Status: DC

## 2017-09-06 MED ORDER — OXYCODONE HCL 5 MG PO TABS: 5.0000 mg | ORAL_TABLET | Freq: Three times a day (TID) | ORAL | 0 refills | Status: DC | PRN

## 2017-09-06 NOTE — Progress Notes (Signed)
Patient escorted by the security guard back to the floor, patient went out off the unit using the staff door adjacent to the main door, per security the security iis about to get off the elevator while the patient is about to go in elevator with his IV pole. Security asked where the patient is going and where asked to go back to the unit . Patient instructed that he does not have any privilage to get off the unit at this time.

## 2017-09-06 NOTE — Social Work (Addendum)
CSW aware per pt RN that pt is declining to discharge to [facility] for assessment as pt requested yesterday. If pt not present at 9am for in person assessment he will not be able to go to [facility] today.   8:58am- CSW aware pt is discharging today and would like to still go for screening at [facility], [place] called and spoke with Altha Harm at [facility] screening. Screening representative aware that pt discharging and shared he is still able to come now.   Pt aware that this is a screening and there is an at least 2 week wait list for inpatient treatment as discussed yesterday, pt still would like to go. Cab voucher has already been arranged, it is only for pt to go to [facility] we are not able to arrange transportation there and back or to house and pharmacy, pt states awareness of this.   Alexander Mt, [place] Work [phone]

## 2017-09-06 NOTE — Progress Notes (Signed)
MD made aware of the plan for the patient. New order for lipase stat given.

## 2017-09-06 NOTE — Discharge Summary (Signed)
Physician Discharge Summary  Patient ID: [name] MRN: [id number] DOB/AGE: 1970-08-27 47 y.o.  Admit date: 09/04/2017 Discharge date: 09/06/2017  Admission Diagnoses:  Discharge Diagnoses:  Principal Problem:   Acute on chronic pancreatitis ([place]) Active Problems:   TOBACCO ABUSE   Benign essential HTN   Polysubstance abuse (tobacco, cocaine, THC, and ETOH)   Alcohol use disorder, severe, dependence ([place])   Discharged Condition: stable  Hospital Course: Patient is a 47 year old [name] with past medical history significant for chronic pancreatitis; alcohol and cocaine dependence; WPW; hypertension; marijuana abuse; hyperlipidemia; and bipolar disorder.  Patient was admitted with abdominal pain.  Patient has chronic alcohol dependence and pancreatitis.  According to the patient, he ate fried chicken the day prior to admission and developed abdominal pain.  Patient was also taking a significant amount of NSAIDs prior to admission (Aleve).  Patient has had about 28 emergency room visits in the last 6 months, and 2 admissions, in the last 6 months.  Patient continued to report abdominal pain during the hospital stay, and kept asking for IV morphine.  Repeat lipase level is down to 36.  Acute on chronic pancreatitis: Abdominal pain has resolved.  Patient was managed supportively.  Patient was also managed with IV Pepcid twice daily for possible gastritis as the patient was taking a lot of NSAIDs prior to admission.  Patient will need to continue Creon on discharge, however, patient may be facing some challenges affording Creon.  Polysubstance abuse: -Counseled. -No withdrawal signs elicited. -CIWA protocol utilized.   -UDS was positive for tetrahydrocannabinol  -Long-term substance abuse treatment in a halfway house-type program recommended for the patient.   -SW consult also requested to attempt to facilitate placement in a long-term treatment program at the time of  discharge.  The plan is for the patient to discharge straight from the hospital to the rehab program.  -The patient will need medication assistance but the program he discharges to may be affiliated with a community clinic and thus be able to facilitate medication assistance for him once he is enrolled. -Continue Zoloft  HTN -Optimized  Consults: None  Significant Diagnostic Studies: labs: Elevated lipase level (160)  Discharge Exam: Blood pressure 129/84, pulse 85, temperature 97.9 F (36.6 C), temperature source Oral, resp. rate 15, SpO2 100 %.   Disposition: Discharge disposition: 01-Home or Self Care   Discharge Instructions    Call MD for:   Complete by:  As directed    Call MD if symptoms worsen   Diet - low sodium heart healthy   Complete by:  As directed    Increase activity slowly   Complete by:  As directed      Allergies as of 09/06/2017      Reactions   Robaxin [methocarbamol] Other (See Comments)   "jumpy limbs"   Shellfish-derived Products Nausea And Vomiting   Trazodone And Nefazodone Other (See Comments)   Reaction:  Muscle spasms   Contrast Media [iodinated Diagnostic Agents] Hives   Reglan [metoclopramide] Other (See Comments)   Reaction:  Muscle spasms      Medication List    STOP taking these medications   ondansetron 4 MG tablet Commonly known as:  ZOFRAN     TAKE these medications   albuterol 108 (90 Base) MCG/ACT inhaler Commonly known as:  PROVENTIL HFA;VENTOLIN HFA Inhale 2 puffs into the lungs every 6 (six) hours as needed for wheezing or shortness of breath.   folic acid 1 MG tablet Commonly known as:  FOLVITE Take 1 tablet (1 mg total) by mouth daily.   hydrALAZINE 25 MG tablet Commonly known as:  APRESOLINE Take 1 tablet (25 mg total) by mouth every 8 (eight) hours.   lipase/protease/amylase 12000 units Cpep capsule Commonly known as:  CREON Take 2 capsules (24,000 Units total) by mouth 3 (three) times daily with meals. For  pancreatitis   loratadine 10 MG tablet Commonly known as:  CLARITIN Take 1 tablet (10 mg total) by mouth daily. (May purchase from over the counter): For allergies   metoprolol tartrate 25 MG tablet Commonly known as:  LOPRESSOR Take 1 tablet (25 mg total) by mouth 2 (two) times daily. For high blood pressure   multivitamin with minerals Tabs tablet Take 1 tablet by mouth daily.   oxyCODONE 5 MG immediate release tablet Commonly known as:  ROXICODONE Take 1 tablet (5 mg total) by mouth every 8 (eight) hours as needed for up to 15 doses for severe pain.   pantoprazole 40 MG tablet Commonly known as:  PROTONIX Take 1 tablet (40 mg total) by mouth daily.   sertraline 100 MG tablet Commonly known as:  ZOLOFT Take 1 tablet (100 mg total) by mouth daily.   sucralfate 1 g tablet Commonly known as:  CARAFATE Take 1 tablet (1 g total) by mouth 4 (four) times daily -  with meals and at bedtime.   thiamine 100 MG tablet Take 1 tablet (100 mg total) by mouth daily.      Follow-up Information    [facility] Follow up.   Specialty:  Family Medicine Why:  Appointment Sep 28, 2017 at 0830 am Contact information: [place] [address] [place] .   Contact information: [place] [address] [phone]          Signed: Bonnell Public 09/06/2017, 8:38 AM

## 2017-09-06 NOTE — Progress Notes (Signed)
Pt is discharged to go home.  Discharge instructions and prescriptions given.  Cab voucher for the rehab facility given. All questions answered.

## 2017-09-07 ENCOUNTER — Institutional Professional Consult (permissible substitution): Payer: Self-pay | Admitting: Licensed Clinical Social Worker

## 2017-09-08 NOTE — Telephone Encounter (Signed)
Patient is aware. [name] [name], CMA  

## 2017-09-14 ENCOUNTER — Emergency Department (HOSPITAL_COMMUNITY)
Admission: EM | Admit: 2017-09-14 | Discharge: 2017-09-15 | Disposition: A | Discharge status: Home / Self Care | Payer: Self-pay | Attending: Emergency Medicine | Admitting: Emergency Medicine

## 2017-09-14 ENCOUNTER — Encounter (HOSPITAL_COMMUNITY): Payer: Self-pay

## 2017-09-14 DIAGNOSIS — Z79899 Other long term (current) drug therapy: Secondary | ICD-10-CM | POA: Insufficient documentation

## 2017-09-14 DIAGNOSIS — I10 Essential (primary) hypertension: Secondary | ICD-10-CM | POA: Insufficient documentation

## 2017-09-14 DIAGNOSIS — J45909 Unspecified asthma, uncomplicated: Secondary | ICD-10-CM | POA: Insufficient documentation

## 2017-09-14 DIAGNOSIS — R1013 Epigastric pain: Secondary | ICD-10-CM | POA: Insufficient documentation

## 2017-09-14 DIAGNOSIS — F17210 Nicotine dependence, cigarettes, uncomplicated: Secondary | ICD-10-CM | POA: Insufficient documentation

## 2017-09-14 LAB — COMPREHENSIVE METABOLIC PANEL
ALBUMIN: 3.8 g/dL (ref 3.5–5.0)
ALT: 19 U/L (ref 17–63)
ANION GAP: 9 (ref 5–15)
AST: 28 U/L (ref 15–41)
Alkaline Phosphatase: 89 U/L (ref 38–126)
BUN: 10 mg/dL (ref 6–20)
CHLORIDE: 105 mmol/L (ref 101–111)
CO2: 23 mmol/L (ref 22–32)
Calcium: 9.2 mg/dL (ref 8.9–10.3)
Creatinine, Ser: 0.9 mg/dL (ref 0.61–1.24)
GFR calc Af Amer: >60 mL/min (ref >60)
GFR calc non Af Amer: >60 mL/min (ref >60)
GLUCOSE: 133 mg/dL — AB (ref 65–99)
POTASSIUM: 4.2 mmol/L (ref 3.5–5.1)
Sodium: 137 mmol/L (ref 135–145)
Total Bilirubin: 0.5 mg/dL (ref 0.3–1.2)
Total Protein: 7.3 g/dL (ref 6.5–8.1)

## 2017-09-14 LAB — CBC
HEMATOCRIT: 29.6 % — AB (ref 39.0–52.0)
HEMOGLOBIN: 9.2 g/dL — AB (ref 13.0–17.0)
MCH: 24.8 pg — ABNORMAL LOW (ref 26.0–34.0)
MCHC: 31.1 g/dL (ref 30.0–36.0)
MCV: 79.8 fL (ref 78.0–100.0)
Platelets: 327 10*3/uL (ref 150–400)
RBC: 3.71 MIL/uL — ABNORMAL LOW (ref 4.22–5.81)
RDW: 18.2 % — AB (ref 11.5–15.5)
WBC: 8.7 10*3/uL (ref 4.0–10.5)

## 2017-09-14 LAB — LIPASE, BLOOD: LIPASE: 52 U/L — AB (ref 11–51)

## 2017-09-14 LAB — I-STAT TROPONIN, ED: Troponin i, poc: 0.01 ng/mL (ref 0.00–0.08)

## 2017-09-14 MED ORDER — GI COCKTAIL ~~LOC~~
30.0000 mL | Freq: Once | ORAL | Status: AC
  Administered 2017-09-15: 30 mL via ORAL
  Filled 2017-09-14: qty 30

## 2017-09-14 NOTE — ED Triage Notes (Signed)
Pt comes via [facility], for chronic pancreatitis, PTA received 50 mcg fentanyl one nitro, and 324 ASA

## 2017-09-14 NOTE — ED Provider Notes (Signed)
Patient placed in Quick Look pathway, seen and evaluated   Chief Complaint: Epigastric abdominal pain, chest pain  HPI:   She with history of chronic pancreatitis.  Presents to the ED for acute on chronic epigastric pain.  Also reports some left-sided chest pain that does not radiate.  Denies associated diaphoresis, shortness of breath.  Does report some nausea and vomiting.  Patient reports recent hospitalization for pancreatitis.  Patient has been taking his medications at home including oxycodone without any relief.  Denies any associated bowel changes.  Denies any fevers.  Denies urinary symptoms.  ROS: Epigastric abdominal pain, nausea, vomiting, chest pain (one)  Physical Exam:   Gen: No distress  Neuro: Awake and Alert  Skin: Warm    Focused Exam: Abdomen is soft and nondistended.  Bowel sounds present in all 4 quadrants.  Minimal tenderness to deep palpation of the epigastric region.  Neurovascularly intact in all extremities.  Heart regular rate and rhythm.  Lungs clear to auscultation bilaterally.   Initiation of care has begun. The patient has been counseled on the process, plan, and necessity for staying for the completion/evaluation, and the remainder of the medical screening examination   Discussed with the patient that exiting the department prior to completion of the work-up is AMA and there is no guarantee that there are no emergency medical conditions present.    Doristine Devoid, PA-C 09/14/17 1952    Carmin Muskrat, MD 09/14/17 2035

## 2017-09-14 NOTE — ED Triage Notes (Signed)
Pt reports central CP that started this afternoon

## 2017-09-15 ENCOUNTER — Other Ambulatory Visit: Payer: Self-pay

## 2017-09-15 ENCOUNTER — Encounter (HOSPITAL_COMMUNITY): Payer: Self-pay | Admitting: *Deleted

## 2017-09-15 ENCOUNTER — Emergency Department (HOSPITAL_COMMUNITY)
Admission: EM | Admit: 2017-09-15 | Discharge: 2017-09-15 | Disposition: A | Discharge status: Home / Self Care | Payer: Self-pay | Attending: Emergency Medicine | Admitting: Emergency Medicine

## 2017-09-15 DIAGNOSIS — F1210 Cannabis abuse, uncomplicated: Secondary | ICD-10-CM | POA: Insufficient documentation

## 2017-09-15 DIAGNOSIS — R1013 Epigastric pain: Secondary | ICD-10-CM | POA: Insufficient documentation

## 2017-09-15 DIAGNOSIS — F17210 Nicotine dependence, cigarettes, uncomplicated: Secondary | ICD-10-CM | POA: Insufficient documentation

## 2017-09-15 DIAGNOSIS — I10 Essential (primary) hypertension: Secondary | ICD-10-CM | POA: Insufficient documentation

## 2017-09-15 DIAGNOSIS — F319 Bipolar disorder, unspecified: Secondary | ICD-10-CM | POA: Insufficient documentation

## 2017-09-15 DIAGNOSIS — Z79899 Other long term (current) drug therapy: Secondary | ICD-10-CM | POA: Insufficient documentation

## 2017-09-15 LAB — CBC WITH DIFFERENTIAL/PLATELET
BASOS ABS: 0 10*3/uL (ref 0.0–0.1)
Basophils Relative: 0 %
EOS ABS: 0.9 10*3/uL — AB (ref 0.0–0.7)
EOS PCT: 9 %
HCT: 28.3 % — ABNORMAL LOW (ref 39.0–52.0)
Hemoglobin: 8.6 g/dL — ABNORMAL LOW (ref 13.0–17.0)
LYMPHS PCT: 28 %
Lymphs Abs: 2.8 10*3/uL (ref 0.7–4.0)
MCH: 24.8 pg — ABNORMAL LOW (ref 26.0–34.0)
MCHC: 30.4 g/dL (ref 30.0–36.0)
MCV: 81.6 fL (ref 78.0–100.0)
MONO ABS: 0.6 10*3/uL (ref 0.1–1.0)
Monocytes Relative: 6 %
Neutro Abs: 5.7 10*3/uL (ref 1.7–7.7)
Neutrophils Relative %: 57 %
PLATELETS: 307 10*3/uL (ref 150–400)
RBC: 3.47 MIL/uL — ABNORMAL LOW (ref 4.22–5.81)
RDW: 18.2 % — AB (ref 11.5–15.5)
WBC: 10 10*3/uL (ref 4.0–10.5)

## 2017-09-15 LAB — ETHANOL: ALCOHOL ETHYL (B): 26 mg/dL — AB (ref <10)

## 2017-09-15 LAB — COMPREHENSIVE METABOLIC PANEL
ALBUMIN: 3.9 g/dL (ref 3.5–5.0)
ALT: 17 U/L (ref 17–63)
AST: 27 U/L (ref 15–41)
Alkaline Phosphatase: 79 U/L (ref 38–126)
Anion gap: 10 (ref 5–15)
BILIRUBIN TOTAL: 0.2 mg/dL — AB (ref 0.3–1.2)
BUN: 12 mg/dL (ref 6–20)
CO2: 21 mmol/L — ABNORMAL LOW (ref 22–32)
Calcium: 9.1 mg/dL (ref 8.9–10.3)
Chloride: 108 mmol/L (ref 101–111)
Creatinine, Ser: 0.8 mg/dL (ref 0.61–1.24)
GFR calc Af Amer: >60 mL/min (ref >60)
GFR calc non Af Amer: >60 mL/min (ref >60)
GLUCOSE: 119 mg/dL — AB (ref 65–99)
POTASSIUM: 3.7 mmol/L (ref 3.5–5.1)
SODIUM: 139 mmol/L (ref 135–145)
TOTAL PROTEIN: 7.9 g/dL (ref 6.5–8.1)

## 2017-09-15 LAB — ACETAMINOPHEN LEVEL: Acetaminophen (Tylenol), Serum: <10 ug/mL — ABNORMAL LOW (ref 10–30)

## 2017-09-15 LAB — SALICYLATE LEVEL: Salicylate Lvl: <7 mg/dL (ref 2.8–30.0)

## 2017-09-15 LAB — LIPASE, BLOOD: Lipase: 47 U/L (ref 11–51)

## 2017-09-15 IMAGING — CT CT ABD-PELV W/ CM
2 of 5 series · 11 of 46 positions shown, 12 images · IV contrast (Iodine)
Comparison: 02/08/2015, 12/17/2014

CLINICAL DATA: Upper abdominal pain with nausea and vomiting

EXAM:
CT ABDOMEN AND PELVIS WITH CONTRAST
TECHNIQUE: Multidetector CT imaging of the abdomen and pelvis was performed
using the standard protocol following bolus administration of
intravenous contrast.
CONTRAST:  100mL OMNIPAQUE IOHEXOL 300 MG/ML  SOLN

[Series 201: routine, idose (2) · axial · 0.65mm/px · z∈[-230,+140]mm · 8 of 91 slices shown, 9 images]
[im 11/91  soft-tissue]
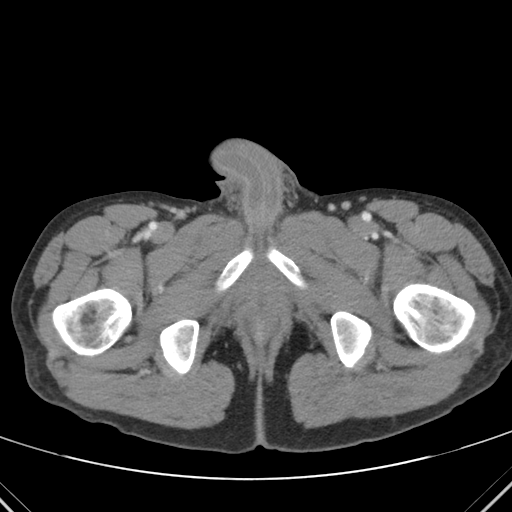
[im 11/91  bone]
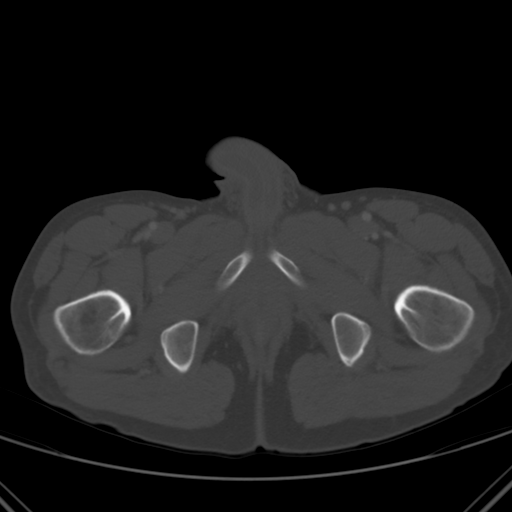
[im 22/91  soft-tissue]
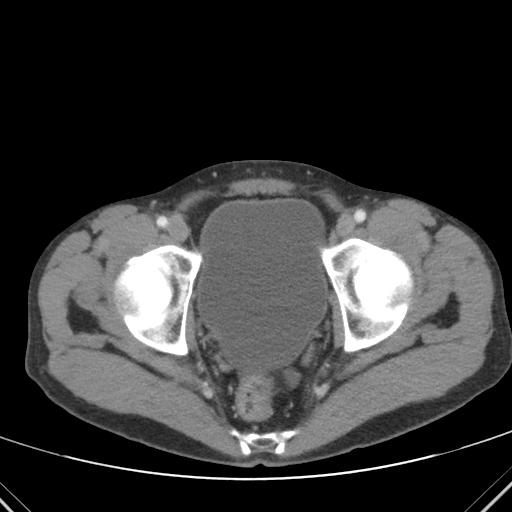
[im 32/91  soft-tissue]
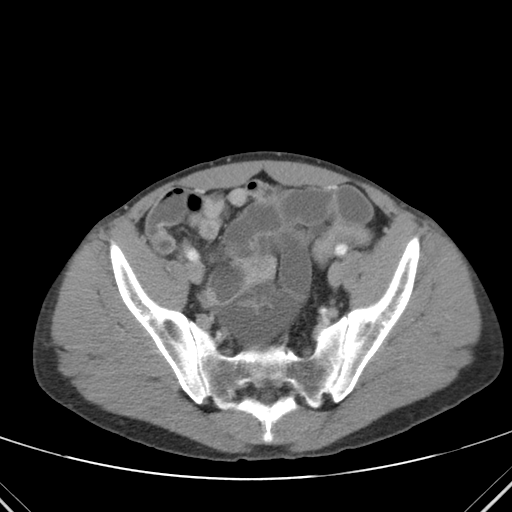
[im 43/91  soft-tissue]
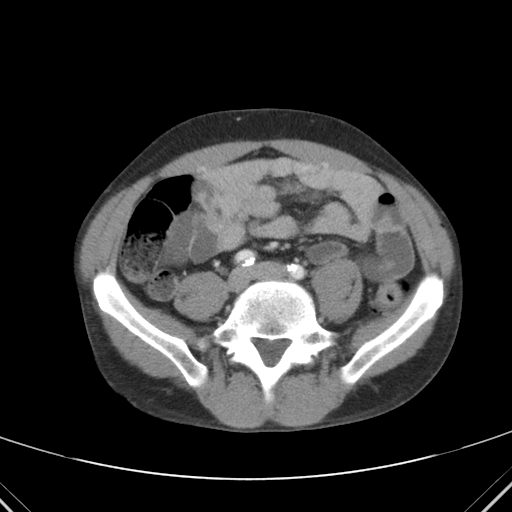
[im 53/91  soft-tissue]
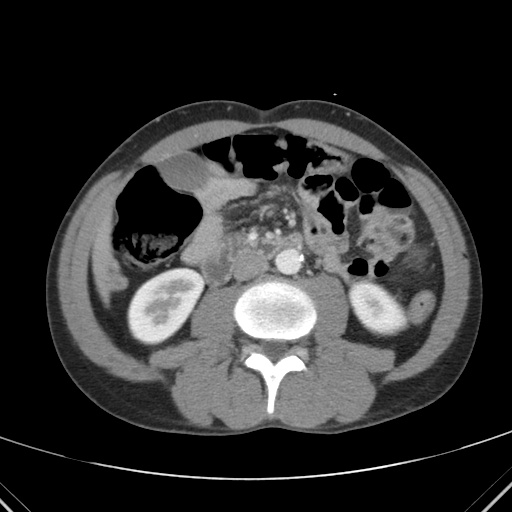
[im 64/91  soft-tissue]
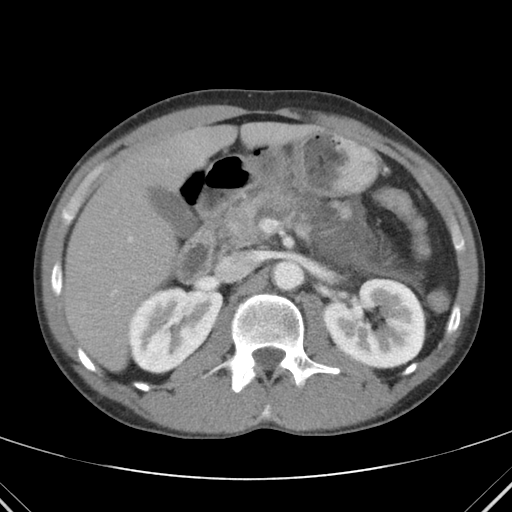
[im 75/91  soft-tissue]
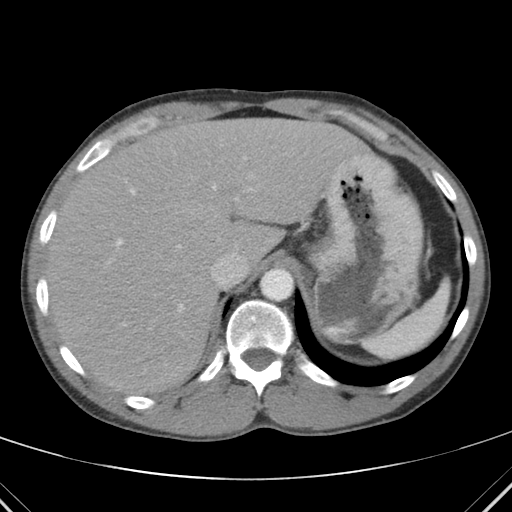
[im 85/91  soft-tissue]
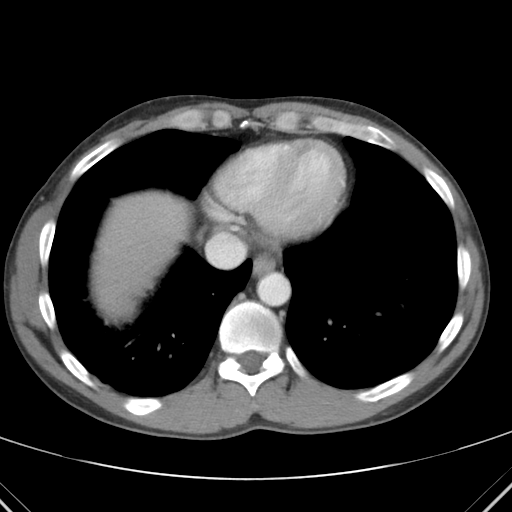

[Series 203: coronals, idose (2) · coronal · 0.45mm/px · 3 of 111 slices shown]
[im 37/111  soft-tissue]
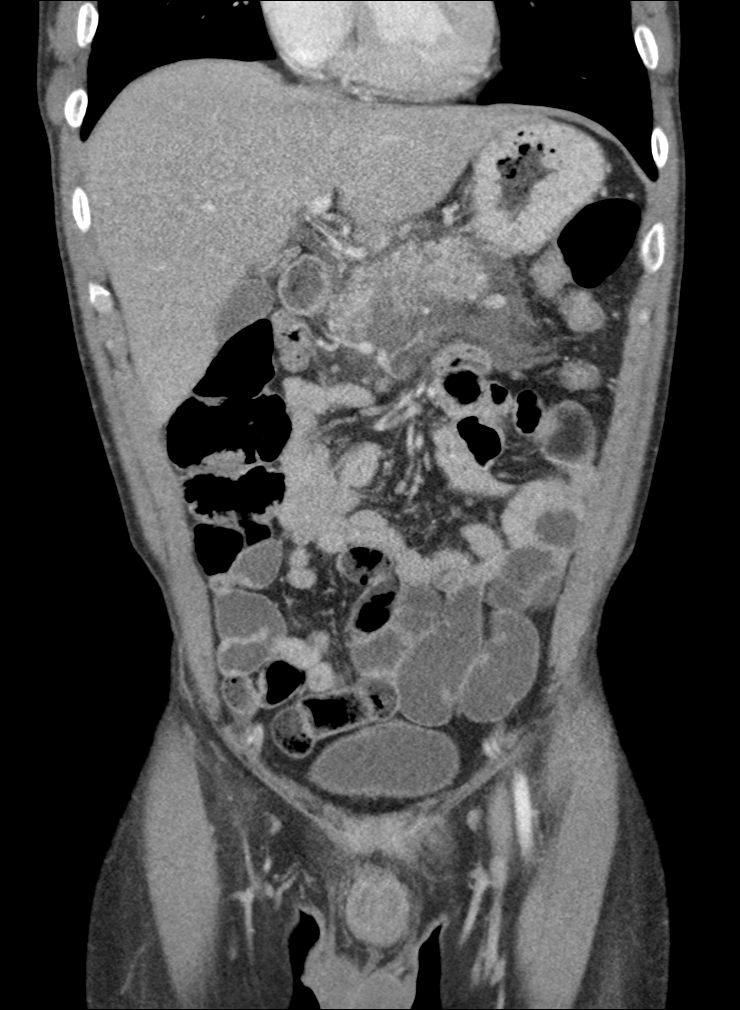
[im 49/111  soft-tissue]
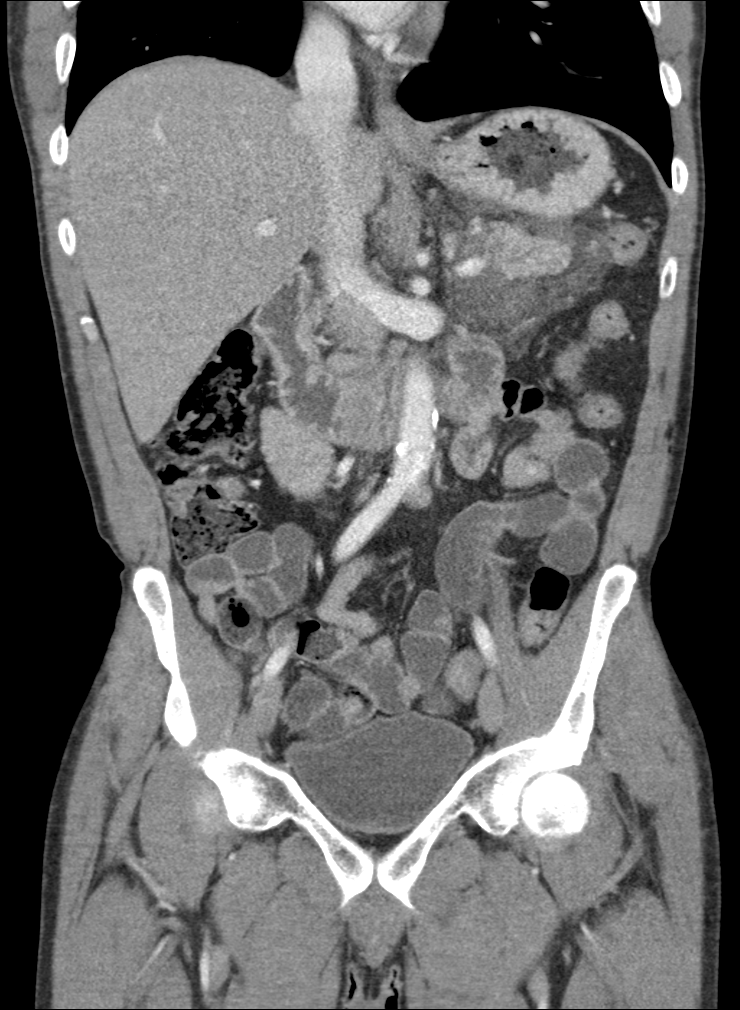
[im 62/111  soft-tissue]
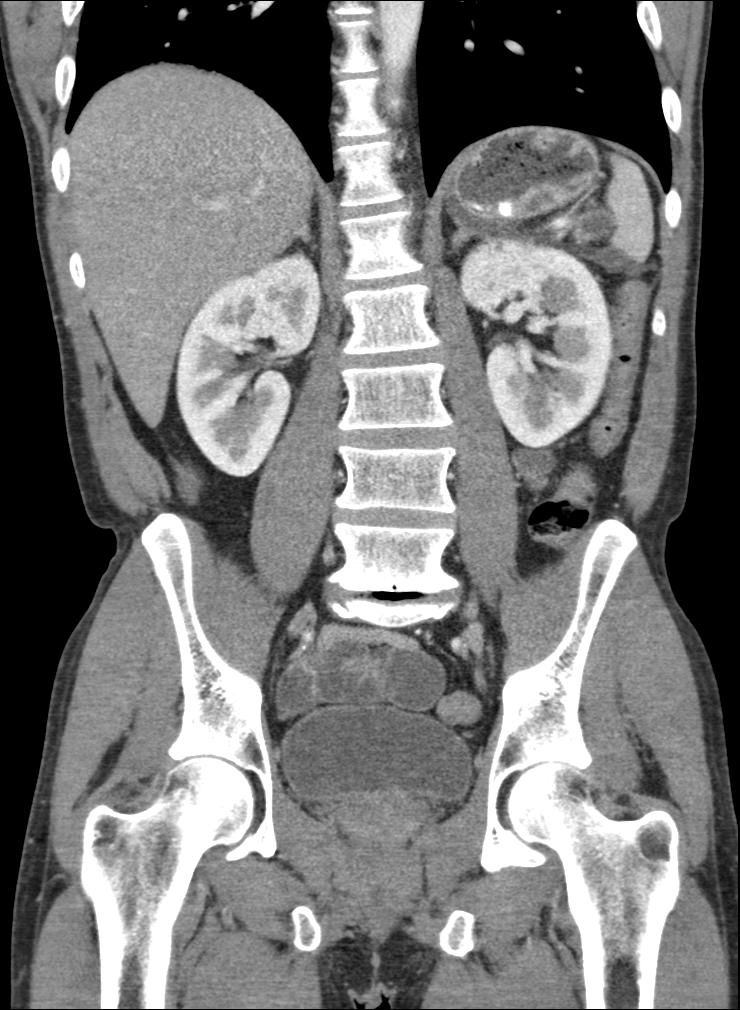

[11 of 46 positions shown; findings below may reference images not displayed]

FINDINGS: Lung bases are well aerated. Very minimal dependent atelectatic
changes are noted on the right.

The liver, gallbladder, spleen, adrenal glands and kidneys are
within normal limits. No calculi are seen. Delayed images
demonstrate normal excretion of contrast.

The pancreas again demonstrates significant peripancreatic
inflammatory changes consistent with pancreatitis. There are cystic
areas in the region of the tail of the pancreas and midbody similar
to that seen on previous MRI examination consistent with small
pseudocyst. They are stable in appearance. No findings to suggest
pancreatic necrosis are noted. The phlegmon extends inferiorly along
the posterior aspect of the stomach and along the anterior aspect of
the left kidney. No significant free pelvic fluid is noted.

The bladder is well distended. The appendix is not well seen
although no inflammatory changes are identified. Diffuse aortoiliac
calcifications are noted. The osseous structures are within normal
limits.
IMPRESSION: When compared with the prior MRI examination from Monday December, 2014
there again noted cystic areas in the body and tail consistent with
small pseudocysts. There stable in appearance. There is diffuse
inflammatory change surrounding the pancreas but mainly in the
region of the body and tail consistent with acute pancreatitis.
Delayed images demonstrate normal enhancement of the pancreas with
the exception of the cystic areas previously described.

## 2017-09-15 MED ORDER — KETOROLAC TROMETHAMINE 30 MG/ML IJ SOLN
30.0000 mg | Freq: Once | INTRAMUSCULAR | Status: AC
  Administered 2017-09-15: 30 mg via INTRAVENOUS
  Filled 2017-09-15: qty 1

## 2017-09-15 MED ORDER — GI COCKTAIL ~~LOC~~
30.0000 mL | Freq: Once | ORAL | Status: AC
  Administered 2017-09-15: 30 mL via ORAL
  Filled 2017-09-15: qty 30

## 2017-09-15 MED ORDER — CHLORDIAZEPOXIDE HCL 25 MG PO CAPS: ORAL_CAPSULE | ORAL | 0 refills | Status: DC

## 2017-09-15 MED ORDER — CHLORDIAZEPOXIDE HCL 25 MG PO CAPS
25.0000 mg | ORAL_CAPSULE | Freq: Once | ORAL | Status: AC
  Administered 2017-09-15: 25 mg via ORAL
  Filled 2017-09-15: qty 1

## 2017-09-15 MED ORDER — KETOROLAC TROMETHAMINE 15 MG/ML IJ SOLN
15.0000 mg | Freq: Once | INTRAMUSCULAR | Status: AC
  Administered 2017-09-15: 15 mg via INTRAVENOUS
  Filled 2017-09-15: qty 1

## 2017-09-15 MED ORDER — ONDANSETRON HCL 4 MG/2ML IJ SOLN
4.0000 mg | Freq: Once | INTRAMUSCULAR | Status: AC
  Administered 2017-09-15: 4 mg via INTRAVENOUS
  Filled 2017-09-15: qty 2

## 2017-09-15 MED ORDER — SODIUM CHLORIDE 0.9 % IV BOLUS
1000.0000 mL | Freq: Once | INTRAVENOUS | Status: AC
  Administered 2017-09-15: 1000 mL via INTRAVENOUS

## 2017-09-15 MED ORDER — SUCRALFATE 1 GM/10ML PO SUSP: 1.0000 g | Freq: Three times a day (TID) | ORAL | 0 refills | Status: DC

## 2017-09-15 NOTE — ED Provider Notes (Signed)
[facility] DEPT Provider Note   CSN: [id number] Arrival date & time: 09/15/17  1423     History   Chief Complaint Chief Complaint  Patient presents with  . Abdominal Pain    HPI [name] is a 47 y.o. male with a history of alcohol abuse, chronic pancreatitis, anxiety, bipolar disorder, htn, illicit drug abuse who presents the emergency department today for medical clearance.  Patient states that [facility] but was told he needs to come here in order to get medically cleared so he can be admitted. He notes that he did have a 24 oz beer last night and since has been having some mild epigastric abdominal pain that he describes as cramping with associated nausea without emesis. He notes that he was prescribed oxycodone during last hospital admission and has been taking this for his symptoms with relief. He previously took carafate for his symptoms but has since run out. He states that he needs a supply of medication in order to be admitted. The patient notes that nothing makes his symptoms worse.  He denies any fever, chills, chest pain, shortness of breath, cough, flank pain, emesis, diarrhea, urinary symptoms.  Patient denies any SI/HI.  No drug use reported. Patient denies chronic nsaid use.   HPI  Past Medical History:  Diagnosis Date  . Alcoholism /alcohol abuse ([place])   . Anemia   . Anxiety   . Arthritis    "knees; arms; elbows" (03/26/2015)  . Asthma   . Bipolar disorder ([place])   . Chronic bronchitis ([place])   . Chronic lower back pain   . Chronic pancreatitis ([place])   . Cocaine abuse ([place])   . Depression   . Family history of adverse reaction to anesthesia    "grandmother gets confused"  . Femoral condyle fracture ([place]) 03/08/2014   left medial/notes 03/09/2014  . GERD (gastroesophageal reflux disease)   . H/O hiatal hernia   . H/O suicide attempt 10/2012  . Heart murmur    "when he was little" (03/06/2013)  . High  cholesterol   . History of blood transfusion 10/2012   "when I tried to commit suicide"  . History of stomach ulcers   . Hypertension   . Marijuana abuse, continuous   . Migraine    "a few times/year" (03/26/2015)  . Pneumonia 1990's X 3  . PTSD (post-traumatic stress disorder)   . Shortness of breath    "can happen at anytime" (03/06/2013)  . Sickle cell trait ([place])   . WPW (Wolff-Parkinson-White syndrome)    [name] 03/06/2013    Patient Active Problem List   Diagnosis Date Noted  . Abdominal pain 05/27/2017  . Hematemesis 05/27/2017  . Tachycardia 03/18/2017  . Diarrhea 03/18/2017  . Acute on chronic pancreatitis ([place]) 12/17/2016  . Intractable nausea and vomiting 12/05/2016  . Verbally abusive behavior 12/05/2016  . Normocytic anemia 12/05/2016  . Alcohol use disorder, severe, dependence ([place]) 07/25/2016  . Cocaine use disorder, severe, dependence ([place]) 07/25/2016  . Major depressive disorder, recurrent severe without psychotic features ([place]) 07/20/2016  . Leukocytosis   . Hospital acquired PNA 05/20/2015  . Pseudocyst of pancreas 05/18/2015  . Polysubstance abuse (tobacco, cocaine, THC, and ETOH) 03/26/2015  . Alcohol-induced chronic pancreatitis ([place])   . Benign essential HTN 02/06/2014  . Alcohol-induced acute pancreatitis 11/28/2013  . Pancreatic pseudocyst/cyst 11/25/2013  . Severe protein-calorie malnutrition ([place]) 10/10/2013  . Suicide attempt ([place]) 10/08/2013  . [name] Parkinson White pattern  seen on electrocardiogram 10/03/2012  . TOBACCO ABUSE 03/23/2007    Past Surgical History:  Procedure Laterality Date  . CARDIAC CATHETERIZATION    . EYE SURGERY Left 1990's   "result of trauma"   . FACIAL FRACTURE SURGERY Left 1990's   "result of trauma"   . FRACTURE SURGERY    . HERNIA REPAIR    . LEFT HEART CATHETERIZATION WITH CORONARY ANGIOGRAM Right 03/07/2013   Procedure: LEFT HEART CATHETERIZATION WITH CORONARY ANGIOGRAM;  Surgeon: [name], MD;   Location: [facility];  Service: Cardiovascular;  Laterality: Right;  . UMBILICAL HERNIA REPAIR          Home Medications    Prior to Admission medications   Medication Sig Start Date End Date Taking? Authorizing Provider  albuterol (PROVENTIL HFA;VENTOLIN HFA) 108 (90 Base) MCG/ACT inhaler Inhale 2 puffs into the lungs every 6 (six) hours as needed for wheezing or shortness of breath. 08/29/17   Clent Demark, PA-C  chlordiazePOXIDE (LIBRIUM) 25 MG capsule 50mg  PO TID x 1D, then 25-50mg  PO BID X 1D, then 25-50mg  PO QD X 1D 09/15/17   Caccavale, Sophia, PA-C  folic acid (FOLVITE) 1 MG tablet Take 1 tablet (1 mg total) by mouth daily. 09/06/17   Bonnell Public, MD  hydrALAZINE (APRESOLINE) 25 MG tablet Take 1 tablet (25 mg total) by mouth every 8 (eight) hours. 08/29/17   Clent Demark, PA-C  lipase/protease/amylase (CREON) 12000 units CPEP capsule Take 2 capsules (24,000 Units total) by mouth 3 (three) times daily with meals. For pancreatitis 06/06/17   [name], MD  loratadine (CLARITIN) 10 MG tablet Take 1 tablet (10 mg total) by mouth daily. (May purchase from over the counter): For allergies 08/29/17   Clent Demark, PA-C  metoprolol tartrate (LOPRESSOR) 25 MG tablet Take 1 tablet (25 mg total) by mouth 2 (two) times daily. For high blood pressure 08/29/17   Clent Demark, PA-C  Multiple Vitamin (MULTIVITAMIN WITH MINERALS) TABS tablet Take 1 tablet by mouth daily. 09/06/17   Bonnell Public, MD  oxyCODONE (ROXICODONE) 5 MG immediate release tablet Take 1 tablet (5 mg total) by mouth every 8 (eight) hours as needed for up to 15 doses for severe pain. 09/06/17   Bonnell Public, MD  pantoprazole (PROTONIX) 40 MG tablet Take 1 tablet (40 mg total) by mouth daily. 08/29/17   Clent Demark, PA-C  sertraline (ZOLOFT) 100 MG tablet Take 1 tablet (100 mg total) by mouth daily. 08/29/17   Clent Demark, PA-C  sucralfate (CARAFATE) 1 g tablet Take 1 tablet (1 g  total) by mouth 4 (four) times daily -  with meals and at bedtime. 08/29/17   Clent Demark, PA-C  thiamine 100 MG tablet Take 1 tablet (100 mg total) by mouth daily. 08/29/17   Clent Demark, PA-C  omeprazole (PRILOSEC) 20 MG capsule Take 1 capsule (20 mg total) daily by mouth. 03/22/17 06/21/17  Varney Biles, MD    Family History Family History  Problem Relation Age of Onset  . Hypertension Mother   . Cirrhosis Mother   . Alcoholism Mother   . Hypertension Father   . Melanoma Father   . Hypertension Other   . Coronary artery disease Other     Social History Social History   Tobacco Use  . Smoking status: Current Every Day Smoker    Packs/day: 1.00    Years: 33.00    Pack years: 33.00    Types:  Cigarettes, E-cigarettes  . Smokeless tobacco: Never Used  Substance Use Topics  . Alcohol use: Yes  . Drug use: Yes    Types: Marijuana, Cocaine    Comment: daily marijuana use; last cocaine use about 3 months ago     Allergies   Robaxin [methocarbamol]; Shellfish-derived products; Trazodone and nefazodone; Contrast media [iodinated diagnostic agents]; and Reglan [metoclopramide]   Review of Systems Review of Systems  All other systems reviewed and are negative.    Physical Exam Updated Vital Signs BP (!) 131/94 (BP Location: Right Arm)   Pulse (!) 105   Temp 98 F (36.7 C) (Oral)   Resp 18   SpO2 100%   Physical Exam  Constitutional: He appears well-developed and well-nourished.  HENT:  Head: Normocephalic and atraumatic.  Right Ear: External ear normal.  Left Ear: External ear normal.  Nose: Nose normal.  Mouth/Throat: Uvula is midline, oropharynx is clear and moist and mucous membranes are normal. No tonsillar exudate.  Eyes: Pupils are equal, round, and reactive to light. Right eye exhibits no discharge. Left eye exhibits no discharge. No scleral icterus.  Neck: Trachea normal. Neck supple. No spinous process tenderness present. No neck rigidity.  Normal range of motion present.  Cardiovascular: Normal rate, regular rhythm and intact distal pulses.  No murmur heard. Pulses:      Radial pulses are 2+ on the right side, and 2+ on the left side.       Dorsalis pedis pulses are 2+ on the right side, and 2+ on the left side.       Posterior tibial pulses are 2+ on the right side, and 2+ on the left side.  No lower extremity swelling or edema. Calves symmetric in size bilaterally.  Pulmonary/Chest: Effort normal and breath sounds normal. He exhibits no tenderness.  Abdominal: Soft. Bowel sounds are normal. He exhibits no distension. There is no rigidity, no rebound, no guarding, no CVA tenderness and negative Murphy's sign.  Mild ttp of the epigastric area. No RUQ ttp.  Negative Murphy sign.  No ascites.  No distention.  No rebound, rigidity or guarding.  Musculoskeletal: He exhibits no edema.  Lymphadenopathy:    He has no cervical adenopathy.  Neurological: He is alert.  Skin: Skin is warm and dry. No rash noted. He is not diaphoretic.  Psychiatric: He has a normal mood and affect.  Nursing note and vitals reviewed.    ED Treatments / Results  Labs (all labs ordered are listed, but only abnormal results are displayed) Labs Reviewed  COMPREHENSIVE METABOLIC PANEL - Abnormal; Notable for the following components:      Result Value   CO2 21 (*)    Glucose, Bld 119 (*)    Total Bilirubin 0.2 (*)    All other components within normal limits  CBC WITH DIFFERENTIAL/PLATELET - Abnormal; Notable for the following components:   RBC 3.47 (*)    Hemoglobin 8.6 (*)    HCT 28.3 (*)    MCH 24.8 (*)    RDW 18.2 (*)    Eosinophils Absolute 0.9 (*)    All other components within normal limits  ETHANOL - Abnormal; Notable for the following components:   Alcohol, Ethyl (B) 26 (*)    All other components within normal limits  ACETAMINOPHEN LEVEL - Abnormal; Notable for the following components:   Acetaminophen (Tylenol), Serum <10 (*)     All other components within normal limits  LIPASE, BLOOD  SALICYLATE LEVEL    EKG None  Radiology No results found.  Procedures Procedures (including critical care time)  Medications Ordered in ED Medications  sodium chloride 0.9 % bolus 1,000 mL (1,000 mLs Intravenous New Bag/Given 09/15/17 1626)  ondansetron (ZOFRAN) injection 4 mg (4 mg Intravenous Given 09/15/17 1627)  ketorolac (TORADOL) 30 MG/ML injection 30 mg (30 mg Intravenous Given 09/15/17 1627)  gi cocktail (Maalox,Lidocaine,Donnatal) (30 mLs Oral Given 09/15/17 1620)     Initial Impression / Assessment and Plan / ED Course  I have reviewed the triage vital signs and the nursing notes.  Pertinent labs & imaging results that were available during my care of the patient were reviewed by me and considered in my medical decision making (see chart for details).     47 y.o. male with a history of alcohol abuse, chronic pancreatitis, anxiety, bipolar disorder, htn, illicit drug abuse who presents the emergency department today for medical clearance.  Patient states that [facility] but was told he needs to come here in order to get medically cleared so he can be admitted.  She was seen here yesterday and given Librium taper.  He had a reassuring work-up.  He notes that he is having some mild epigastric pain after drinking alcohol last night that is consistent with his chronic pancreatitis.  Patient is without fever.  No right upper quadrant tenderness palpation or Murphy sign.  I do not feel he needs an RUQ [place] to evaluate for cholecysitis.  Patient's symptoms were controlled in the department.  Patient without any grossly abnormal labs when compared to yesterday.  Patient appears clinically sober. The evaluation does not show pathology that would require ongoing emergent intervention or inpatient treatment. No SI/HI that would make me feel patient needs BH assessment. Patient will be discharged home they can be admitted to  [facility]. Patient told to avoid alcohol while on librium taper. He is tolerating PO fluids. Pain is controlled. Specific return precautions discussed. The patient verbalized understanding and agreement with plan. All questions answered. No further questions at this time. The patient is hemodynamically stable, mentating appropriately and appears safe for discharge.  Final Clinical Impressions(s) / ED Diagnoses   Final diagnoses:  Epigastric abdominal pain    ED Discharge Orders        Ordered    sucralfate (CARAFATE) 1 GM/10ML suspension  3 times daily with meals & bedtime     09/15/17 1809       Lorelle Gibbs 09/15/17 2301    Lacretia Leigh, MD 09/16/17 [phone]

## 2017-09-15 NOTE — Discharge Instructions (Signed)
Take librium as prescribed. Continue taking your home medications as prescribed. Avoid alcohol, marijuana, other drug use.  Avoid spicy foods, fatty foods, greasy foods. Follow-up with [facility] tomorrow. Return to the emergency room if you develop high fevers, persistent vomiting despite medication, or any new or concerning symptoms.

## 2017-09-15 NOTE — Discharge Instructions (Addendum)
Patient is medically cleared for [facility].  He reports he is no longer taking his bipolar medication.  The patient is currently taking a librium taper that was rx'd yesterday.  Follow attached handouts.  If you develop worsening or new concerning symptoms you can return to the emergency department for re-evaluation.

## 2017-09-15 NOTE — ED Notes (Signed)
Bed: WA04 Expected date:  Expected time:  Means of arrival:  Comments: Possible pancreatitis

## 2017-09-15 NOTE — ED Provider Notes (Signed)
[name] EMERGENCY DEPARTMENT Provider Note   CSN: [id number] Arrival date & time: 09/14/17  1937     History   Chief Complaint Chief Complaint  Patient presents with  . Abdominal Pain  . Chest Pain    HPI [name] is a 47 y.o. male presenting for evaluation of worsened abdominal pain.  Patient states that he had worsened abdominal pain beginning this afternoon.  This began after eating a cheeseburger and drinking a beer.  He states pain is sharp and in the epigastric area.  He reports several episodes of emesis and 2 episodes of loose stools.  He reports central chest pain, which may be related to his epigastric pain.  No associated diaphoresis or shortness of breath.  He denies fevers, chills, lower abdominal pain, or normal urination.  He states he has taken his home medications without improvement of symptoms, this includes Zofran, Carafate, famotidine, and oxycodone.  [facility] to help with alcohol use.   HPI  Past Medical History:  Diagnosis Date  . Alcoholism /alcohol abuse ([place])   . Anemia   . Anxiety   . Arthritis    "knees; arms; elbows" (03/26/2015)  . Asthma   . Bipolar disorder ([place])   . Chronic bronchitis ([place])   . Chronic lower back pain   . Chronic pancreatitis ([place])   . Cocaine abuse ([place])   . Depression   . Family history of adverse reaction to anesthesia    "grandmother gets confused"  . Femoral condyle fracture ([place]) 03/08/2014   left medial/notes 03/09/2014  . GERD (gastroesophageal reflux disease)   . H/O hiatal hernia   . H/O suicide attempt 10/2012  . Heart murmur    "when he was little" (03/06/2013)  . High cholesterol   . History of blood transfusion 10/2012   "when I tried to commit suicide"  . History of stomach ulcers   . Hypertension   . Marijuana abuse, continuous   . Migraine    "a few times/year" (03/26/2015)  . Pneumonia 1990's X 3  .  PTSD (post-traumatic stress disorder)   . Shortness of breath    "can happen at anytime" (03/06/2013)  . Sickle cell trait ([place])   . WPW (Wolff-Parkinson-White syndrome)    [name] 03/06/2013    Patient Active Problem List   Diagnosis Date Noted  . Abdominal pain 05/27/2017  . Hematemesis 05/27/2017  . Tachycardia 03/18/2017  . Diarrhea 03/18/2017  . Acute on chronic pancreatitis ([place]) 12/17/2016  . Intractable nausea and vomiting 12/05/2016  . Verbally abusive behavior 12/05/2016  . Normocytic anemia 12/05/2016  . Alcohol use disorder, severe, dependence ([place]) 07/25/2016  . Cocaine use disorder, severe, dependence ([place]) 07/25/2016  . Major depressive disorder, recurrent severe without psychotic features ([place]) 07/20/2016  . Leukocytosis   . Hospital acquired PNA 05/20/2015  . Pseudocyst of pancreas 05/18/2015  . Polysubstance abuse (tobacco, cocaine, THC, and ETOH) 03/26/2015  . Alcohol-induced chronic pancreatitis ([place])   . Benign essential HTN 02/06/2014  . Alcohol-induced acute pancreatitis 11/28/2013  . Pancreatic pseudocyst/cyst 11/25/2013  . Severe protein-calorie malnutrition ([place]) 10/10/2013  . Suicide attempt ([place]) 10/08/2013  . 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 TOBACCO ABUSE 03/23/2007    Past Surgical History:  Procedure Laterality Date  . CARDIAC CATHETERIZATION    . EYE SURGERY Left 1990's   "result of trauma"   . FACIAL FRACTURE SURGERY  Left 1990's   "result of trauma"   . FRACTURE SURGERY    . HERNIA REPAIR    . LEFT HEART CATHETERIZATION WITH CORONARY ANGIOGRAM Right 03/07/2013   Procedure: LEFT HEART CATHETERIZATION WITH CORONARY ANGIOGRAM;  Surgeon: [name], MD;  Location: [facility] CATH LAB;  Service: Cardiovascular;  Laterality: Right;  . UMBILICAL HERNIA REPAIR          Home Medications    Prior to Admission medications   Medication Sig Start Date End Date Taking? Authorizing Provider  albuterol (PROVENTIL  HFA;VENTOLIN HFA) 108 (90 Base) MCG/ACT inhaler Inhale 2 puffs into the lungs every 6 (six) hours as needed for wheezing or shortness of breath. 08/29/17   Clent Demark, PA-C  chlordiazePOXIDE (LIBRIUM) 25 MG capsule 50mg  PO TID x 1D, then 25-50mg  PO BID X 1D, then 25-50mg  PO QD X 1D 09/15/17   Tavone Caesar, PA-C  folic acid (FOLVITE) 1 MG tablet Take 1 tablet (1 mg total) by mouth daily. 09/06/17   Bonnell Public, MD  hydrALAZINE (APRESOLINE) 25 MG tablet Take 1 tablet (25 mg total) by mouth every 8 (eight) hours. 08/29/17   Clent Demark, PA-C  lipase/protease/amylase (CREON) 12000 units CPEP capsule Take 2 capsules (24,000 Units total) by mouth 3 (three) times daily with meals. For pancreatitis 06/06/17   [name], MD  loratadine (CLARITIN) 10 MG tablet Take 1 tablet (10 mg total) by mouth daily. (May purchase from over the counter): For allergies 08/29/17   Clent Demark, PA-C  metoprolol tartrate (LOPRESSOR) 25 MG tablet Take 1 tablet (25 mg total) by mouth 2 (two) times daily. For high blood pressure 08/29/17   Clent Demark, PA-C  Multiple Vitamin (MULTIVITAMIN WITH MINERALS) TABS tablet Take 1 tablet by mouth daily. 09/06/17   Bonnell Public, MD  oxyCODONE (ROXICODONE) 5 MG immediate release tablet Take 1 tablet (5 mg total) by mouth every 8 (eight) hours as needed for up to 15 doses for severe pain. 09/06/17   Bonnell Public, MD  pantoprazole (PROTONIX) 40 MG tablet Take 1 tablet (40 mg total) by mouth daily. 08/29/17   Clent Demark, PA-C  sertraline (ZOLOFT) 100 MG tablet Take 1 tablet (100 mg total) by mouth daily. 08/29/17   Clent Demark, PA-C  sucralfate (CARAFATE) 1 g tablet Take 1 tablet (1 g total) by mouth 4 (four) times daily -  with meals and at bedtime. 08/29/17   Clent Demark, PA-C  thiamine 100 MG tablet Take 1 tablet (100 mg total) by mouth daily. 08/29/17   Clent Demark, PA-C  omeprazole (PRILOSEC) 20 MG capsule Take 1  capsule (20 mg total) daily by mouth. 03/22/17 06/21/17  Varney Biles, MD    Family History Family History  Problem Relation Age of Onset  . Hypertension Mother   . Cirrhosis Mother   . Alcoholism Mother   . Hypertension Father   . Melanoma Father   . Hypertension Other   . Coronary artery disease Other     Social History Social History   Tobacco Use  . Smoking status: Current Every Day Smoker    Packs/day: 1.00    Years: 33.00    Pack years: 33.00    Types: Cigarettes, E-cigarettes  . Smokeless tobacco: Never Used  Substance Use Topics  . Alcohol use: Yes  . Drug use: Yes    Types: Marijuana, Cocaine    Comment: daily marijuana use; last cocaine use about 3 months ago  Allergies   Robaxin [methocarbamol]; Shellfish-derived products; Trazodone and nefazodone; Contrast media [iodinated diagnostic agents]; and Reglan [metoclopramide]   Review of Systems Review of Systems  Gastrointestinal: Positive for abdominal pain, diarrhea, nausea and vomiting.  All other systems reviewed and are negative.    Physical Exam Updated Vital Signs BP (!) 150/98 (BP Location: Left Arm)   Pulse 99   Temp 98.6 F (37 C) (Oral)   Resp 17   SpO2 100%   Physical Exam  Constitutional: He is oriented to person, place, and time. He appears well-developed and well-nourished. No distress.  Sitting comfortably in bed in no apparent distress.  HENT:  Head: Normocephalic and atraumatic.  MM moist  Eyes: Pupils are equal, round, and reactive to light. Conjunctivae and EOM are normal.  Neck: Normal range of motion. Neck supple.  Cardiovascular: Normal rate, regular rhythm and intact distal pulses.  Pulmonary/Chest: Effort normal and breath sounds normal. No respiratory distress. He has no wheezes. He exhibits no tenderness.  Abdominal: Soft. Bowel sounds are normal. He exhibits no distension and no mass. There is tenderness. There is no rebound and no guarding.  Mild tenderness to  palpation of epigastric abdomen.  Abdomen is soft without rigidity, guarding, or distention.  Bowel sounds normoactive x4.  Musculoskeletal: Normal range of motion.  Neurological: He is alert and oriented to person, place, and time.  No tremors or shakiness.  Skin: Skin is warm and dry.  Psychiatric: He has a normal mood and affect.  Nursing note and vitals reviewed.    ED Treatments / Results  Labs (all labs ordered are listed, but only abnormal results are displayed) Labs Reviewed  LIPASE, BLOOD - Abnormal; Notable for the following components:      Result Value   Lipase 52 (*)    All other components within normal limits  COMPREHENSIVE METABOLIC PANEL - Abnormal; Notable for the following components:   Glucose, Bld 133 (*)    All other components within normal limits  CBC - Abnormal; Notable for the following components:   RBC 3.71 (*)    Hemoglobin 9.2 (*)    HCT 29.6 (*)    MCH 24.8 (*)    RDW 18.2 (*)    All other components within normal limits  I-STAT TROPONIN, ED    EKG EKG Interpretation  Date/Time:  Thursday Sep 14 2017 19:42:06 EDT Ventricular Rate:  117 PR Interval:  124 QRS Duration: 78 QT Interval:  308 QTC Calculation: 429 R Axis:   81 Text Interpretation:  Sinus tachycardia Biatrial enlargement Abnormal ECG When compared with ECG of 09/04/2017, T wave inversion Inferior leads Anterolateral leads is no longer present Confirmed by Delora Fuel ([id number]) on 09/15/2017 5:57:46 AM   Radiology No results found.  Procedures Procedures (including critical care time)  Medications Ordered in ED Medications  gi cocktail (Maalox,Lidocaine,Donnatal) (30 mLs Oral Given 09/15/17 0012)  ketorolac (TORADOL) 15 MG/ML injection 15 mg (15 mg Intravenous Given 09/15/17 0012)  ondansetron (ZOFRAN) injection 4 mg (4 mg Intravenous Given 09/15/17 0012)  chlordiazePOXIDE (LIBRIUM) capsule 25 mg (25 mg Oral Given 09/15/17 0016)     Initial Impression / Assessment and Plan /  ED Course  I have reviewed the triage vital signs and the nursing notes.  Pertinent labs & imaging results that were available during my care of the patient were reviewed by me and considered in my medical decision making (see chart for details).     Patient presenting for evaluation of epigastric abdominal  pain, nausea, vomiting, diarrhea.  Physical exam reassuring, patient is afebrile not tachycardic.  He does not appear dehydrated.  Abdominal exam with minimal epigastric tenderness without signs of surgical abdomen.  Labs reassuring, no leukocytosis.  Lipase reassuring.  CMP without creatinine or electrolyte abnormalities.  Hemoglobin stable at 9.2.  Troponin negative.  EKG shows T wave changes when compared to prior, but no overt STEMI.  This patient states his pain is related to his epigastric pain, troponin is negative, and patient without associated symptoms, doubt ACS.  Discussed findings with patient.  Discussed that his abd pain is likely irritation of the pancreas due to alcohol and cheeseburger ingestion.  Will treat symptomatically with GI cocktail, Toradol, Zofran.  I believe [facility] to prevent alcohol use will be more beneficial than admission to the hospital.  Patient states he is feeling anxious like he needs a drink, will give dose of Librium.  No tremors or shakiness noted.  At this time, I do not believe further imaging would be beneficial, doubt obstruction, perforation, or intra-abdominal infection.  Patient appears safe for discharge.  Return precautions given.  Patient states he understands and agrees to plan.  Final Clinical Impressions(s) / ED Diagnoses   Final diagnoses:  Epigastric abdominal pain    ED Discharge Orders        Ordered    chlordiazePOXIDE (LIBRIUM) 25 MG capsule     09/15/17 0003       Franchot Heidelberg, PA-C [birth year] [id number]    Delora Fuel, MD 09/16/00 [phone]

## 2017-09-15 NOTE — ED Triage Notes (Signed)
Per EMS, pt complains of abdominal pain, has hx of pancreatitis. Pt was seen for same this morning. Pt denies alcohol use today. Pt states he also has a bed at [facility].

## 2017-09-25 ENCOUNTER — Other Ambulatory Visit: Payer: Self-pay

## 2017-09-25 ENCOUNTER — Emergency Department (HOSPITAL_BASED_OUTPATIENT_CLINIC_OR_DEPARTMENT_OTHER)
Admission: EM | Admit: 2017-09-25 | Discharge: 2017-09-26 | Disposition: A | Discharge status: Home / Self Care | Payer: Self-pay | Attending: Emergency Medicine | Admitting: Emergency Medicine

## 2017-09-25 ENCOUNTER — Encounter (HOSPITAL_BASED_OUTPATIENT_CLINIC_OR_DEPARTMENT_OTHER): Payer: Self-pay | Admitting: *Deleted

## 2017-09-25 DIAGNOSIS — R109 Unspecified abdominal pain: Secondary | ICD-10-CM | POA: Insufficient documentation

## 2017-09-25 DIAGNOSIS — Z79899 Other long term (current) drug therapy: Secondary | ICD-10-CM | POA: Insufficient documentation

## 2017-09-25 DIAGNOSIS — G8929 Other chronic pain: Secondary | ICD-10-CM | POA: Insufficient documentation

## 2017-09-25 DIAGNOSIS — F17210 Nicotine dependence, cigarettes, uncomplicated: Secondary | ICD-10-CM | POA: Insufficient documentation

## 2017-09-25 DIAGNOSIS — J45909 Unspecified asthma, uncomplicated: Secondary | ICD-10-CM | POA: Insufficient documentation

## 2017-09-25 DIAGNOSIS — F1210 Cannabis abuse, uncomplicated: Secondary | ICD-10-CM | POA: Insufficient documentation

## 2017-09-25 DIAGNOSIS — K859 Acute pancreatitis without necrosis or infection, unspecified: Secondary | ICD-10-CM | POA: Insufficient documentation

## 2017-09-25 DIAGNOSIS — F17290 Nicotine dependence, other tobacco product, uncomplicated: Secondary | ICD-10-CM | POA: Insufficient documentation

## 2017-09-25 LAB — CBC
HCT: 25.1 % — ABNORMAL LOW (ref 39.0–52.0)
HEMOGLOBIN: 8.3 g/dL — AB (ref 13.0–17.0)
MCH: 25 pg — ABNORMAL LOW (ref 26.0–34.0)
MCHC: 33.1 g/dL (ref 30.0–36.0)
MCV: 75.6 fL — ABNORMAL LOW (ref 78.0–100.0)
PLATELETS: 317 10*3/uL (ref 150–400)
RBC: 3.32 MIL/uL — ABNORMAL LOW (ref 4.22–5.81)
RDW: 18 % — ABNORMAL HIGH (ref 11.5–15.5)
WBC: 9.9 10*3/uL (ref 4.0–10.5)

## 2017-09-25 LAB — COMPREHENSIVE METABOLIC PANEL
ALBUMIN: 3.9 g/dL (ref 3.5–5.0)
ALK PHOS: 64 U/L (ref 38–126)
ALT: 35 U/L (ref 17–63)
ANION GAP: 9 (ref 5–15)
AST: 46 U/L — ABNORMAL HIGH (ref 15–41)
BILIRUBIN TOTAL: 0.2 mg/dL — AB (ref 0.3–1.2)
BUN: 8 mg/dL (ref 6–20)
CALCIUM: 9.1 mg/dL (ref 8.9–10.3)
CO2: 26 mmol/L (ref 22–32)
Chloride: 104 mmol/L (ref 101–111)
Creatinine, Ser: 0.86 mg/dL (ref 0.61–1.24)
GFR calc non Af Amer: >60 mL/min (ref >60)
GLUCOSE: 149 mg/dL — AB (ref 65–99)
Potassium: 4.1 mmol/L (ref 3.5–5.1)
Sodium: 139 mmol/L (ref 135–145)
TOTAL PROTEIN: 7.7 g/dL (ref 6.5–8.1)

## 2017-09-25 LAB — URINALYSIS, ROUTINE W REFLEX MICROSCOPIC
BILIRUBIN URINE: NEGATIVE
Glucose, UA: NEGATIVE mg/dL
Hgb urine dipstick: NEGATIVE
KETONES UR: NEGATIVE mg/dL
Leukocytes, UA: NEGATIVE
NITRITE: NEGATIVE
PH: 6.5 (ref 5.0–8.0)
Protein, ur: NEGATIVE mg/dL
Specific Gravity, Urine: 1.01 (ref 1.005–1.030)

## 2017-09-25 LAB — LIPASE, BLOOD: Lipase: 108 U/L — ABNORMAL HIGH (ref 11–51)

## 2017-09-25 NOTE — ED Triage Notes (Signed)
Abdominal pain for an hour. Vomiting and diarrhea. Hx of pancreatitis. No alcohol in 10 days per pt.

## 2017-09-25 NOTE — ED Notes (Signed)
Currently a pt at [facility]

## 2017-09-26 MED ORDER — ONDANSETRON HCL 4 MG/2ML IJ SOLN
4.0000 mg | Freq: Once | INTRAMUSCULAR | Status: AC
  Administered 2017-09-26: 4 mg via INTRAVENOUS
  Filled 2017-09-26: qty 2

## 2017-09-26 MED ORDER — HYDROMORPHONE HCL 1 MG/ML IJ SOLN
1.0000 mg | Freq: Once | INTRAMUSCULAR | Status: AC
  Administered 2017-09-26: 1 mg via INTRAVENOUS
  Filled 2017-09-26: qty 1

## 2017-09-26 MED ORDER — SODIUM CHLORIDE 0.9 % IV BOLUS
1000.0000 mL | Freq: Once | INTRAVENOUS | Status: AC
  Administered 2017-09-26: 1000 mL via INTRAVENOUS

## 2017-09-26 MED ORDER — GI COCKTAIL ~~LOC~~
30.0000 mL | Freq: Once | ORAL | Status: AC
  Administered 2017-09-26: 30 mL via ORAL
  Filled 2017-09-26: qty 30

## 2017-09-26 NOTE — ED Notes (Signed)
ED Provider at bedside. 

## 2017-09-26 NOTE — Discharge Instructions (Addendum)
Clear liquid diet for the next 24 hours, then advance to bland foods as tolerated.  Follow-up with your primary doctor in the next 1 to 2 weeks.

## 2017-09-26 NOTE — ED Notes (Signed)
Spoke with Sharyn Lull at [facility] and informed that has received a narcotic but will not be given a prescription for controlled substances

## 2017-09-26 NOTE — ED Notes (Signed)
Report called to [facility] and will send someone to transport back to facility

## 2017-09-26 NOTE — ED Provider Notes (Signed)
[facility] EMERGENCY DEPARTMENT Provider Note   CSN: [id number] Arrival date & time: 09/25/17  1920     History   Chief Complaint Chief Complaint  Patient presents with  . Abdominal Pain    HPI [name] is a 47 y.o. male.  Patient is a 47 year old male with past medical history of chronic pancreatitis with chronic abdominal pain, chronic alcoholism, bipolar disorder, and polysubstance abuse.  He presents today for evaluation of abdominal pain.  He is currently in alcohol treatment at [facility] where he developed severe epigastric pain that began after eating.  This feels similar to his prior pancreatitis flareups.  The history is provided by the patient.  Abdominal Pain   This is a chronic problem. The problem occurs constantly. The pain is located in the epigastric region. The quality of the pain is tearing. The pain is severe. Pertinent negatives include fever and constipation. Exacerbated by: Eating, movement, and palpation. Nothing relieves the symptoms.    Past Medical History:  Diagnosis Date  . Alcoholism /alcohol abuse ([place])   . Anemia   . Anxiety   . Arthritis    "knees; arms; elbows" (03/26/2015)  . Asthma   . Bipolar disorder ([place])   . Chronic bronchitis ([place])   . Chronic lower back pain   . Chronic pancreatitis ([place])   . Cocaine abuse ([place])   . Depression   . Family history of adverse reaction to anesthesia    "grandmother gets confused"  . Femoral condyle fracture ([place]) 03/08/2014   left medial/notes 03/09/2014  . GERD (gastroesophageal reflux disease)   . H/O hiatal hernia   . H/O suicide attempt 10/2012  . Heart murmur    "when he was little" (03/06/2013)  . High cholesterol   . History of blood transfusion 10/2012   "when I tried to commit suicide"  . History of stomach ulcers   . Hypertension   . Marijuana abuse, continuous   . Migraine    "a few times/year" (03/26/2015)  . Pneumonia 1990's X 3  . PTSD (post-traumatic  stress disorder)   . Shortness of breath    "can happen at anytime" (03/06/2013)  . Sickle cell trait ([place])   . WPW (Wolff-Parkinson-White syndrome)    [name] 03/06/2013    Patient Active Problem List   Diagnosis Date Noted  . Abdominal pain 05/27/2017  . Hematemesis 05/27/2017  . Tachycardia 03/18/2017  . Diarrhea 03/18/2017  . Acute on chronic pancreatitis ([place]) 12/17/2016  . Intractable nausea and vomiting 12/05/2016  . Verbally abusive behavior 12/05/2016  . Normocytic anemia 12/05/2016  . Alcohol use disorder, severe, dependence ([place]) 07/25/2016  . Cocaine use disorder, severe, dependence ([place]) 07/25/2016  . Major depressive disorder, recurrent severe without psychotic features ([place]) 07/20/2016  . Leukocytosis   . Hospital acquired PNA 05/20/2015  . Pseudocyst of pancreas 05/18/2015  . Polysubstance abuse (tobacco, cocaine, THC, and ETOH) 03/26/2015  . Alcohol-induced chronic pancreatitis ([place])   . Benign essential HTN 02/06/2014  . Alcohol-induced acute pancreatitis 11/28/2013  . Pancreatic pseudocyst/cyst 11/25/2013  . Severe protein-calorie malnutrition ([place]) 10/10/2013  . Suicide attempt ([place]) 10/08/2013  . 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 TOBACCO ABUSE 03/23/2007    Past Surgical History:  Procedure Laterality Date  . CARDIAC CATHETERIZATION    . EYE SURGERY Left 1990's   "result of trauma"   . FACIAL FRACTURE SURGERY Left 1990's   "result of trauma"   . FRACTURE SURGERY    .  HERNIA REPAIR    . LEFT HEART CATHETERIZATION WITH CORONARY ANGIOGRAM Right 03/07/2013   Procedure: LEFT HEART CATHETERIZATION WITH CORONARY ANGIOGRAM;  Surgeon: [name], MD;  Location: [facility] CATH LAB;  Service: Cardiovascular;  Laterality: Right;  . UMBILICAL HERNIA REPAIR          Home Medications    Prior to Admission medications   Medication Sig Start Date End Date Taking? Authorizing Provider  albuterol (PROVENTIL HFA;VENTOLIN HFA)  108 (90 Base) MCG/ACT inhaler Inhale 2 puffs into the lungs every 6 (six) hours as needed for wheezing or shortness of breath. 08/29/17   Clent Demark, PA-C  chlordiazePOXIDE (LIBRIUM) 25 MG capsule 50mg  PO TID x 1D, then 25-50mg  PO BID X 1D, then 25-50mg  PO QD X 1D Patient taking differently: 25-50mg  PO BID X 1D, then 25-50mg  PO QD X 1D 09/15/17   Caccavale, Sophia, PA-C  folic acid (FOLVITE) 1 MG tablet Take 1 tablet (1 mg total) by mouth daily. 09/06/17   Bonnell Public, MD  hydrALAZINE (APRESOLINE) 25 MG tablet Take 1 tablet (25 mg total) by mouth every 8 (eight) hours. 08/29/17   Clent Demark, PA-C  lipase/protease/amylase (CREON) 12000 units CPEP capsule Take 2 capsules (24,000 Units total) by mouth 3 (three) times daily with meals. For pancreatitis Patient not taking: Reported on 09/15/2017 06/06/17   [name], MD  loratadine (CLARITIN) 10 MG tablet Take 1 tablet (10 mg total) by mouth daily. (May purchase from over the counter): For allergies 08/29/17   Clent Demark, PA-C  metoprolol tartrate (LOPRESSOR) 25 MG tablet Take 1 tablet (25 mg total) by mouth 2 (two) times daily. For high blood pressure 08/29/17   Clent Demark, PA-C  Multiple Vitamin (MULTIVITAMIN WITH MINERALS) TABS tablet Take 1 tablet by mouth daily. 09/06/17   Bonnell Public, MD  oxyCODONE (ROXICODONE) 5 MG immediate release tablet Take 1 tablet (5 mg total) by mouth every 8 (eight) hours as needed for up to 15 doses for severe pain. 09/06/17   Bonnell Public, MD  pantoprazole (PROTONIX) 40 MG tablet Take 1 tablet (40 mg total) by mouth daily. 08/29/17   Clent Demark, PA-C  sertraline (ZOLOFT) 100 MG tablet Take 1 tablet (100 mg total) by mouth daily. Patient not taking: Reported on 09/15/2017 08/29/17   Clent Demark, PA-C  sucralfate (CARAFATE) 1 GM/10ML suspension Take 10 mLs (1 g total) by mouth 4 (four) times daily -  with meals and at bedtime. 09/15/17   Maczis, Barth Kirks, PA-C    thiamine 100 MG tablet Take 1 tablet (100 mg total) by mouth daily. 08/29/17   Clent Demark, PA-C  omeprazole (PRILOSEC) 20 MG capsule Take 1 capsule (20 mg total) daily by mouth. 03/22/17 06/21/17  Varney Biles, MD    Family History Family History  Problem Relation Age of Onset  . Hypertension Mother   . Cirrhosis Mother   . Alcoholism Mother   . Hypertension Father   . Melanoma Father   . Hypertension Other   . Coronary artery disease Other     Social History Social History   Tobacco Use  . Smoking status: Current Every Day Smoker    Packs/day: 1.00    Years: 33.00    Pack years: 33.00    Types: Cigarettes, E-cigarettes  . Smokeless tobacco: Never Used  Substance Use Topics  . Alcohol use: Yes  . Drug use: Yes    Types: Marijuana, Cocaine  Comment: daily marijuana use; last cocaine use about 3 months ago     Allergies   Robaxin [methocarbamol]; Shellfish-derived products; Trazodone and nefazodone; Contrast media [iodinated diagnostic agents]; and Reglan [metoclopramide]   Review of Systems Review of Systems  Constitutional: Negative for fever.  Gastrointestinal: Positive for abdominal pain. Negative for constipation.  All other systems reviewed and are negative.    Physical Exam Updated Vital Signs BP 135/86 (BP Location: Left Arm)   Pulse 69   Temp 98.2 F (36.8 C) (Oral)   Resp 20   Ht 5\' 9"  (1.753 m)   Wt 60.8 kg (134 lb)   SpO2 100%   BMI 19.79 kg/m   Physical Exam  Constitutional: He is oriented to person, place, and time. He appears well-developed and well-nourished. No distress.  HENT:  Head: Normocephalic and atraumatic.  Mouth/Throat: Oropharynx is clear and moist.  Neck: Normal range of motion. Neck supple.  Cardiovascular: Normal rate and regular rhythm. Exam reveals no friction rub.  No murmur heard. Pulmonary/Chest: Effort normal and breath sounds normal. No respiratory distress. He has no wheezes. He has no rales.   Abdominal: Soft. Bowel sounds are normal. He exhibits no distension. There is tenderness in the epigastric area. There is no rigidity, no rebound and no guarding.  Musculoskeletal: Normal range of motion. He exhibits no edema.  Neurological: He is alert and oriented to person, place, and time. Coordination normal.  Skin: Skin is warm and dry. He is not diaphoretic.  Nursing note and vitals reviewed.    ED Treatments / Results  Labs (all labs ordered are listed, but only abnormal results are displayed) Labs Reviewed  LIPASE, BLOOD - Abnormal; Notable for the following components:      Result Value   Lipase 108 (*)    All other components within normal limits  COMPREHENSIVE METABOLIC PANEL - Abnormal; Notable for the following components:   Glucose, Bld 149 (*)    AST 46 (*)    Total Bilirubin 0.2 (*)    All other components within normal limits  CBC - Abnormal; Notable for the following components:   RBC 3.32 (*)    Hemoglobin 8.3 (*)    HCT 25.1 (*)    MCV 75.6 (*)    MCH 25.0 (*)    RDW 18.0 (*)    All other components within normal limits  URINALYSIS, ROUTINE W REFLEX MICROSCOPIC    EKG None  Radiology No results found.  Procedures Procedures (including critical care time)  Medications Ordered in ED Medications  HYDROmorphone (DILAUDID) injection 1 mg (has no administration in time range)  ondansetron (ZOFRAN) injection 4 mg (has no administration in time range)  sodium chloride 0.9 % bolus 1,000 mL (has no administration in time range)     Initial Impression / Assessment and Plan / ED Course  I have reviewed the triage vital signs and the nursing notes.  Pertinent labs & imaging results that were available during my care of the patient were reviewed by me and considered in my medical decision making (see chart for details).  Patient's pain treated in the ER.  He is feeling better after fluids and medications given.  This patient is well-known to the  emergency department for his chronic pancreatitis.  He appears at his baseline and I believe appropriate for discharge.  [facility]  Final Clinical Impressions(s) / ED Diagnoses   Final diagnoses:  None    ED Discharge Orders  None       Veryl Speak, MD 09/26/17 0130

## 2017-09-28 ENCOUNTER — Encounter (HOSPITAL_COMMUNITY): Payer: Self-pay | Admitting: Emergency Medicine

## 2017-09-28 ENCOUNTER — Ambulatory Visit (INDEPENDENT_AMBULATORY_CARE_PROVIDER_SITE_OTHER): Payer: Self-pay | Admitting: Nurse Practitioner

## 2017-09-28 ENCOUNTER — Inpatient Hospital Stay (HOSPITAL_COMMUNITY)
Admission: EM | Admit: 2017-09-28 | Discharge: 2017-10-01 | DRG: 439 | Disposition: A | Discharge status: Home / Self Care | Payer: Self-pay | Attending: Internal Medicine | Admitting: Internal Medicine

## 2017-09-28 DIAGNOSIS — Z760 Encounter for issue of repeat prescription: Secondary | ICD-10-CM

## 2017-09-28 DIAGNOSIS — F1910 Other psychoactive substance abuse, uncomplicated: Secondary | ICD-10-CM | POA: Diagnosis present

## 2017-09-28 DIAGNOSIS — I10 Essential (primary) hypertension: Secondary | ICD-10-CM | POA: Diagnosis present

## 2017-09-28 DIAGNOSIS — K852 Alcohol induced acute pancreatitis without necrosis or infection: Principal | ICD-10-CM | POA: Diagnosis present

## 2017-09-28 DIAGNOSIS — F4310 Post-traumatic stress disorder, unspecified: Secondary | ICD-10-CM | POA: Diagnosis present

## 2017-09-28 DIAGNOSIS — G8929 Other chronic pain: Secondary | ICD-10-CM | POA: Diagnosis present

## 2017-09-28 DIAGNOSIS — J42 Unspecified chronic bronchitis: Secondary | ICD-10-CM | POA: Diagnosis present

## 2017-09-28 DIAGNOSIS — Y903 Blood alcohol level of 60-79 mg/100 ml: Secondary | ICD-10-CM | POA: Diagnosis present

## 2017-09-28 DIAGNOSIS — F332 Major depressive disorder, recurrent severe without psychotic features: Secondary | ICD-10-CM | POA: Diagnosis present

## 2017-09-28 DIAGNOSIS — Z91013 Allergy to seafood: Secondary | ICD-10-CM

## 2017-09-28 DIAGNOSIS — M545 Low back pain: Secondary | ICD-10-CM | POA: Diagnosis present

## 2017-09-28 DIAGNOSIS — K859 Acute pancreatitis without necrosis or infection, unspecified: Secondary | ICD-10-CM | POA: Diagnosis present

## 2017-09-28 DIAGNOSIS — Z7151 Drug abuse counseling and surveillance of drug abuser: Secondary | ICD-10-CM

## 2017-09-28 DIAGNOSIS — K860 Alcohol-induced chronic pancreatitis: Secondary | ICD-10-CM | POA: Diagnosis present

## 2017-09-28 DIAGNOSIS — K219 Gastro-esophageal reflux disease without esophagitis: Secondary | ICD-10-CM | POA: Diagnosis present

## 2017-09-28 DIAGNOSIS — Z8249 Family history of ischemic heart disease and other diseases of the circulatory system: Secondary | ICD-10-CM

## 2017-09-28 DIAGNOSIS — Z888 Allergy status to other drugs, medicaments and biological substances status: Secondary | ICD-10-CM

## 2017-09-28 DIAGNOSIS — Z79899 Other long term (current) drug therapy: Secondary | ICD-10-CM

## 2017-09-28 DIAGNOSIS — Z811 Family history of alcohol abuse and dependence: Secondary | ICD-10-CM

## 2017-09-28 DIAGNOSIS — Z8379 Family history of other diseases of the digestive system: Secondary | ICD-10-CM

## 2017-09-28 DIAGNOSIS — D72829 Elevated white blood cell count, unspecified: Secondary | ICD-10-CM | POA: Diagnosis not present

## 2017-09-28 DIAGNOSIS — I456 Pre-excitation syndrome: Secondary | ICD-10-CM | POA: Diagnosis present

## 2017-09-28 DIAGNOSIS — K861 Other chronic pancreatitis: Secondary | ICD-10-CM

## 2017-09-28 DIAGNOSIS — F17210 Nicotine dependence, cigarettes, uncomplicated: Secondary | ICD-10-CM | POA: Diagnosis present

## 2017-09-28 DIAGNOSIS — F1020 Alcohol dependence, uncomplicated: Secondary | ICD-10-CM | POA: Diagnosis present

## 2017-09-28 DIAGNOSIS — Z808 Family history of malignant neoplasm of other organs or systems: Secondary | ICD-10-CM

## 2017-09-28 DIAGNOSIS — Z91041 Radiographic dye allergy status: Secondary | ICD-10-CM

## 2017-09-28 DIAGNOSIS — Z8711 Personal history of peptic ulcer disease: Secondary | ICD-10-CM

## 2017-09-28 DIAGNOSIS — D509 Iron deficiency anemia, unspecified: Secondary | ICD-10-CM | POA: Diagnosis present

## 2017-09-28 LAB — URINALYSIS, ROUTINE W REFLEX MICROSCOPIC
BILIRUBIN URINE: NEGATIVE
GLUCOSE, UA: NEGATIVE mg/dL
Hgb urine dipstick: NEGATIVE
KETONES UR: NEGATIVE mg/dL
Leukocytes, UA: NEGATIVE
Nitrite: NEGATIVE
PH: 5 (ref 5.0–8.0)
Protein, ur: NEGATIVE mg/dL
Specific Gravity, Urine: 1.012 (ref 1.005–1.030)

## 2017-09-28 LAB — ETHANOL: Alcohol, Ethyl (B): 74 mg/dL — ABNORMAL HIGH (ref <10)

## 2017-09-28 LAB — CBC WITH DIFFERENTIAL/PLATELET
BASOS PCT: 0 %
Basophils Absolute: 0 10*3/uL (ref 0.0–0.1)
EOS ABS: 1.1 10*3/uL — AB (ref 0.0–0.7)
EOS PCT: 10 %
HCT: 25 % — ABNORMAL LOW (ref 39.0–52.0)
Hemoglobin: 7.9 g/dL — ABNORMAL LOW (ref 13.0–17.0)
LYMPHS ABS: 3.9 10*3/uL (ref 0.7–4.0)
Lymphocytes Relative: 36 %
MCH: 24.3 pg — AB (ref 26.0–34.0)
MCHC: 31.6 g/dL (ref 30.0–36.0)
MCV: 76.9 fL — AB (ref 78.0–100.0)
Monocytes Absolute: 1.2 10*3/uL — ABNORMAL HIGH (ref 0.1–1.0)
Monocytes Relative: 11 %
Neutro Abs: 4.6 10*3/uL (ref 1.7–7.7)
Neutrophils Relative %: 43 %
Platelets: 236 10*3/uL (ref 150–400)
RBC: 3.25 MIL/uL — AB (ref 4.22–5.81)
RDW: 18 % — AB (ref 11.5–15.5)
WBC: 10.8 10*3/uL — AB (ref 4.0–10.5)

## 2017-09-28 LAB — COMPREHENSIVE METABOLIC PANEL
ALK PHOS: 67 U/L (ref 38–126)
ALT: 28 U/L (ref 17–63)
AST: 31 U/L (ref 15–41)
Albumin: 3.6 g/dL (ref 3.5–5.0)
Anion gap: 9 (ref 5–15)
BILIRUBIN TOTAL: 0.5 mg/dL (ref 0.3–1.2)
BUN: 10 mg/dL (ref 6–20)
CALCIUM: 9 mg/dL (ref 8.9–10.3)
CO2: 22 mmol/L (ref 22–32)
CREATININE: 1 mg/dL (ref 0.61–1.24)
Chloride: 111 mmol/L (ref 101–111)
GFR calc Af Amer: >60 mL/min (ref >60)
GFR calc non Af Amer: >60 mL/min (ref >60)
GLUCOSE: 103 mg/dL — AB (ref 65–99)
Potassium: 3.8 mmol/L (ref 3.5–5.1)
SODIUM: 142 mmol/L (ref 135–145)
TOTAL PROTEIN: 7.5 g/dL (ref 6.5–8.1)

## 2017-09-28 LAB — RAPID URINE DRUG SCREEN, HOSP PERFORMED
AMPHETAMINES: NOT DETECTED
BENZODIAZEPINES: POSITIVE — AB
Barbiturates: POSITIVE — AB
Cocaine: POSITIVE — AB
Opiates: NOT DETECTED
TETRAHYDROCANNABINOL: NOT DETECTED

## 2017-09-28 LAB — LIPASE, BLOOD: Lipase: 211 U/L — ABNORMAL HIGH (ref 11–51)

## 2017-09-28 LAB — MAGNESIUM: Magnesium: 1.6 mg/dL — ABNORMAL LOW (ref 1.7–2.4)

## 2017-09-28 LAB — POC OCCULT BLOOD, ED: Fecal Occult Bld: NEGATIVE

## 2017-09-28 MED ORDER — SERTRALINE HCL 100 MG PO TABS
100.0000 mg | ORAL_TABLET | Freq: Every day | ORAL | Status: DC
  Administered 2017-09-29 – 2017-10-01 (×3): 100 mg via ORAL
  Filled 2017-09-28 (×3): qty 1

## 2017-09-28 MED ORDER — METOPROLOL TARTRATE 25 MG PO TABS
25.0000 mg | ORAL_TABLET | Freq: Two times a day (BID) | ORAL | Status: DC
  Administered 2017-09-29 – 2017-10-01 (×6): 25 mg via ORAL
  Filled 2017-09-28 (×6): qty 1

## 2017-09-28 MED ORDER — ADULT MULTIVITAMIN W/MINERALS CH: 1.0000 | ORAL_TABLET | Freq: Every day | ORAL | Status: DC

## 2017-09-28 MED ORDER — POTASSIUM CHLORIDE 2 MEQ/ML IV SOLN
INTRAVENOUS | Status: AC
  Administered 2017-09-29 (×2): via INTRAVENOUS
  Filled 2017-09-28 (×7): qty 1000

## 2017-09-28 MED ORDER — FAMOTIDINE IN NACL 20-0.9 MG/50ML-% IV SOLN
20.0000 mg | Freq: Once | INTRAVENOUS | Status: AC
Start: 2017-09-28 — End: 2017-09-28
  Administered 2017-09-28: 20 mg via INTRAVENOUS
  Filled 2017-09-28: qty 50

## 2017-09-28 MED ORDER — VITAMIN B-1 100 MG PO TABS: 100.0000 mg | ORAL_TABLET | Freq: Every day | ORAL | Status: DC

## 2017-09-28 MED ORDER — LORAZEPAM 2 MG/ML IJ SOLN: 1.0000 mg | Freq: Four times a day (QID) | INTRAMUSCULAR | Status: DC | PRN

## 2017-09-28 MED ORDER — ALBUTEROL SULFATE HFA 108 (90 BASE) MCG/ACT IN AERS: 2.0000 | INHALATION_SPRAY | Freq: Four times a day (QID) | RESPIRATORY_TRACT | Status: DC | PRN

## 2017-09-28 MED ORDER — HYDROMORPHONE HCL 1 MG/ML IJ SOLN
1.0000 mg | Freq: Once | INTRAMUSCULAR | Status: AC
  Administered 2017-09-28: 1 mg via INTRAVENOUS
  Filled 2017-09-28: qty 1

## 2017-09-28 MED ORDER — ONDANSETRON HCL 4 MG/2ML IJ SOLN
4.0000 mg | Freq: Four times a day (QID) | INTRAMUSCULAR | Status: DC | PRN
  Administered 2017-09-29: 4 mg via INTRAVENOUS
  Filled 2017-09-28: qty 2

## 2017-09-28 MED ORDER — SODIUM CHLORIDE 0.9 % IV BOLUS
1000.0000 mL | Freq: Once | INTRAVENOUS | Status: AC
  Administered 2017-09-28: 1000 mL via INTRAVENOUS

## 2017-09-28 MED ORDER — ENOXAPARIN SODIUM 40 MG/0.4ML ~~LOC~~ SOLN
40.0000 mg | Freq: Every day | SUBCUTANEOUS | Status: DC
  Administered 2017-09-29 – 2017-09-30 (×2): 40 mg via SUBCUTANEOUS
  Filled 2017-09-28 (×2): qty 0.4

## 2017-09-28 MED ORDER — ADULT MULTIVITAMIN W/MINERALS CH
1.0000 | ORAL_TABLET | Freq: Every day | ORAL | Status: DC
  Administered 2017-09-29 – 2017-10-01 (×3): 1 via ORAL
  Filled 2017-09-28 (×3): qty 1

## 2017-09-28 MED ORDER — THIAMINE HCL 100 MG/ML IJ SOLN: 100.0000 mg | Freq: Every day | INTRAMUSCULAR | Status: DC

## 2017-09-28 MED ORDER — ONDANSETRON HCL 4 MG PO TABS: 4.0000 mg | ORAL_TABLET | Freq: Four times a day (QID) | ORAL | Status: DC | PRN

## 2017-09-28 MED ORDER — LORAZEPAM 1 MG PO TABS
0.0000 mg | ORAL_TABLET | Freq: Two times a day (BID) | ORAL | Status: DC
  Filled 2017-09-28: qty 1

## 2017-09-28 MED ORDER — FOLIC ACID 1 MG PO TABS
1.0000 mg | ORAL_TABLET | Freq: Every day | ORAL | Status: DC
  Administered 2017-09-29 – 2017-10-01 (×3): 1 mg via ORAL
  Filled 2017-09-28 (×3): qty 1

## 2017-09-28 MED ORDER — PANCRELIPASE (LIP-PROT-AMYL) 12000-38000 UNITS PO CPEP
12000.0000 [IU] | ORAL_CAPSULE | Freq: Three times a day (TID) | ORAL | Status: DC
  Administered 2017-09-29 – 2017-10-01 (×7): 12000 [IU] via ORAL
  Filled 2017-09-28 (×8): qty 1

## 2017-09-28 MED ORDER — LORAZEPAM 1 MG PO TABS
1.0000 mg | ORAL_TABLET | Freq: Four times a day (QID) | ORAL | Status: DC | PRN
  Administered 2017-09-29 – 2017-10-01 (×6): 1 mg via ORAL
  Filled 2017-09-28 (×3): qty 1

## 2017-09-28 MED ORDER — HYDROMORPHONE HCL 1 MG/ML IJ SOLN
0.5000 mg | INTRAMUSCULAR | Status: DC | PRN
  Administered 2017-09-28 – 2017-09-29 (×3): 0.5 mg via INTRAVENOUS
  Filled 2017-09-28 (×2): qty 0.5
  Filled 2017-09-28: qty 1
  Filled 2017-09-28: qty 0.5

## 2017-09-28 MED ORDER — LORAZEPAM 1 MG PO TABS
1.0000 mg | ORAL_TABLET | Freq: Once | ORAL | Status: AC
  Administered 2017-09-29: 1 mg via ORAL
  Filled 2017-09-28: qty 1

## 2017-09-28 MED ORDER — ONDANSETRON HCL 4 MG/2ML IJ SOLN
4.0000 mg | Freq: Once | INTRAMUSCULAR | Status: AC
  Administered 2017-09-28: 4 mg via INTRAVENOUS
  Filled 2017-09-28: qty 2

## 2017-09-28 MED ORDER — LORAZEPAM 1 MG PO TABS
0.0000 mg | ORAL_TABLET | Freq: Four times a day (QID) | ORAL | Status: DC
  Administered 2017-09-29 (×2): 1 mg via ORAL
  Filled 2017-09-28 (×4): qty 1

## 2017-09-28 MED ORDER — FOLIC ACID 1 MG PO TABS: 1.0000 mg | ORAL_TABLET | Freq: Every day | ORAL | Status: DC

## 2017-09-28 MED ORDER — VITAMIN B-1 100 MG PO TABS
100.0000 mg | ORAL_TABLET | Freq: Every day | ORAL | Status: DC
  Administered 2017-09-29 – 2017-10-01 (×3): 100 mg via ORAL
  Filled 2017-09-28 (×3): qty 1

## 2017-09-28 MED ORDER — ALBUTEROL SULFATE (2.5 MG/3ML) 0.083% IN NEBU
2.5000 mg | INHALATION_SOLUTION | Freq: Four times a day (QID) | RESPIRATORY_TRACT | Status: DC | PRN
  Filled 2017-09-28: qty 3

## 2017-09-28 MED ORDER — POLYETHYLENE GLYCOL 3350 17 G PO PACK: 17.0000 g | PACK | Freq: Every day | ORAL | Status: DC | PRN

## 2017-09-28 NOTE — ED Provider Notes (Signed)
[facility] DEPT Provider Note   CSN: [id number] Arrival date & time: 09/28/17  1701     History   Chief Complaint Chief Complaint  Patient presents with  . Abdominal Pain    HPI [name] is a 47 y.o. male.  Pt presents to the ED today with abdominal pain.  Pt has a hx of chronic abdominal pain and chronic pancreatitis.  He was at [facility] getting treatment for his alcohol abuse, when he started getting abdominal pain.  He went to the ED at [facility] on 5/20 and was given dilaudid and was feeling better.  Because he received a narcotic, he was dismissed from [facility] and d/c home.  The pt has had pain ever since.  He drank 2 beers this morning due to pain.  He has been unable to keep down other fluids.  He denies f/c.     Past Medical History:  Diagnosis Date  . Alcoholism /alcohol abuse ([place])   . Anemia   . Anxiety   . Arthritis    "knees; arms; elbows" (03/26/2015)  . Asthma   . Bipolar disorder ([place])   . Chronic bronchitis ([place])   . Chronic lower back pain   . Chronic pancreatitis ([place])   . Cocaine abuse ([place])   . Depression   . Family history of adverse reaction to anesthesia    "grandmother gets confused"  . Femoral condyle fracture ([place]) 03/08/2014   left medial/notes 03/09/2014  . GERD (gastroesophageal reflux disease)   . H/O hiatal hernia   . H/O suicide attempt 10/2012  . Heart murmur    "when he was little" (03/06/2013)  . High cholesterol   . History of blood transfusion 10/2012   "when I tried to commit suicide"  . History of stomach ulcers   . Hypertension   . Marijuana abuse, continuous   . Migraine    "a few times/year" (03/26/2015)  . Pneumonia 1990's X 3  . PTSD (post-traumatic stress disorder)   . Shortness of breath    "can happen at anytime" (03/06/2013)  . Sickle cell trait ([place])   . WPW (Wolff-Parkinson-White syndrome)    [name] 03/06/2013    Patient Active Problem List   Diagnosis Date Noted  .  Abdominal pain 05/27/2017  . Hematemesis 05/27/2017  . Tachycardia 03/18/2017  . Diarrhea 03/18/2017  . Acute on chronic pancreatitis ([place]) 12/17/2016  . Intractable nausea and vomiting 12/05/2016  . Verbally abusive behavior 12/05/2016  . Normocytic anemia 12/05/2016  . Alcohol use disorder, severe, dependence ([place]) 07/25/2016  . Cocaine use disorder, severe, dependence ([place]) 07/25/2016  . Major depressive disorder, recurrent severe without psychotic features ([place]) 07/20/2016  . Leukocytosis   . Hospital acquired PNA 05/20/2015  . Pseudocyst of pancreas 05/18/2015  . Polysubstance abuse (tobacco, cocaine, THC, and ETOH) 03/26/2015  . Alcohol-induced chronic pancreatitis ([place])   . Benign essential HTN 02/06/2014  . Alcohol-induced acute pancreatitis 11/28/2013  . Pancreatic pseudocyst/cyst 11/25/2013  . Severe protein-calorie malnutrition ([place]) 10/10/2013  . Suicide attempt ([place]) 10/08/2013  . 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 TOBACCO ABUSE 03/23/2007    Past Surgical History:  Procedure Laterality Date  . CARDIAC CATHETERIZATION    . EYE SURGERY Left 1990's   "result of trauma"   . FACIAL FRACTURE SURGERY Left 1990's   "result of trauma"   . FRACTURE SURGERY    . HERNIA REPAIR    . LEFT HEART CATHETERIZATION WITH CORONARY ANGIOGRAM Right  03/07/2013   Procedure: LEFT HEART CATHETERIZATION WITH CORONARY ANGIOGRAM;  Surgeon: [name], MD;  Location: [facility] CATH LAB;  Service: Cardiovascular;  Laterality: Right;  . UMBILICAL HERNIA REPAIR          Home Medications    Prior to Admission medications   Medication Sig Start Date End Date Taking? Authorizing Provider  albuterol (PROVENTIL HFA;VENTOLIN HFA) 108 (90 Base) MCG/ACT inhaler Inhale 2 puffs into the lungs every 6 (six) hours as needed for wheezing or shortness of breath. 08/29/17  Yes Clent Demark, PA-C  folic acid (FOLVITE) 1 MG tablet Take 1 tablet (1 mg total) by mouth  daily. 09/06/17  Yes Dana Allan I, MD  hydrALAZINE (APRESOLINE) 25 MG tablet Take 1 tablet (25 mg total) by mouth every 8 (eight) hours. Patient taking differently: Take 25 mg by mouth at bedtime.  08/29/17  Yes Clent Demark, PA-C  lipase/protease/amylase (CREON) 12000 units CPEP capsule Take 2 capsules (24,000 Units total) by mouth 3 (three) times daily with meals. For pancreatitis Patient taking differently: Take 12,000 Units by mouth 3 (three) times daily with meals. For pancreatitis 06/06/17  Yes [name], MD  loratadine (CLARITIN) 10 MG tablet Take 1 tablet (10 mg total) by mouth daily. (May purchase from over the counter): For allergies Patient taking differently: Take 10 mg by mouth daily as needed for allergies or rhinitis. (May purchase from over the counter): For allergies 08/29/17  Yes Clent Demark, PA-C  metoprolol tartrate (LOPRESSOR) 25 MG tablet Take 1 tablet (25 mg total) by mouth 2 (two) times daily. For high blood pressure 08/29/17  Yes Clent Demark, PA-C  Multiple Vitamin (MULTIVITAMIN WITH MINERALS) TABS tablet Take 1 tablet by mouth daily. 09/06/17  Yes Bonnell Public, MD  ondansetron (ZOFRAN) 4 MG tablet Take 4 mg by mouth 2 (two) times daily as needed for nausea/vomiting. 09/19/17  Yes [provider]  oxyCODONE (ROXICODONE) 5 MG immediate release tablet Take 1 tablet (5 mg total) by mouth every 8 (eight) hours as needed for up to 15 doses for severe pain. 09/06/17  Yes Dana Allan I, MD  pantoprazole (PROTONIX) 40 MG tablet Take 1 tablet (40 mg total) by mouth daily. 08/29/17  Yes Clent Demark, PA-C  sucralfate (CARAFATE) 1 g tablet Take 1 tablet by mouth 4 (four) times daily. 09/19/17  Yes [provider]  chlordiazePOXIDE (LIBRIUM) 25 MG capsule 50mg  PO TID x 1D, then 25-50mg  PO BID X 1D, then 25-50mg  PO QD X 1D Patient not taking: Reported on 09/28/2017 09/15/17   Caccavale, Sophia, PA-C  sertraline (ZOLOFT) 100 MG tablet  Take 1 tablet (100 mg total) by mouth daily. Patient not taking: Reported on 09/15/2017 08/29/17   Clent Demark, PA-C  sucralfate (CARAFATE) 1 GM/10ML suspension Take 10 mLs (1 g total) by mouth 4 (four) times daily -  with meals and at bedtime. Patient not taking: Reported on 09/28/2017 09/15/17   Maczis, Barth Kirks, PA-C  thiamine 100 MG tablet Take 1 tablet (100 mg total) by mouth daily. Patient not taking: Reported on 09/28/2017 08/29/17   Clent Demark, PA-C  omeprazole (PRILOSEC) 20 MG capsule Take 1 capsule (20 mg total) daily by mouth. 03/22/17 06/21/17  Varney Biles, MD    Family History Family History  Problem Relation Age of Onset  . Hypertension Mother   . Cirrhosis Mother   . Alcoholism Mother   . Hypertension Father   . Melanoma Father   .  Hypertension Other   . Coronary artery disease Other     Social History Social History   Tobacco Use  . Smoking status: Current Every Day Smoker    Packs/day: 1.00    Years: 33.00    Pack years: 33.00    Types: Cigarettes, E-cigarettes  . Smokeless tobacco: Never Used  Substance Use Topics  . Alcohol use: Yes  . Drug use: Yes    Types: Marijuana, Cocaine    Comment: daily marijuana use; last cocaine use about 3 months ago     Allergies   Robaxin [methocarbamol]; Shellfish-derived products; Trazodone and nefazodone; Contrast media [iodinated diagnostic agents]; and Reglan [metoclopramide]   Review of Systems Review of Systems  Gastrointestinal: Positive for abdominal pain, diarrhea, nausea and vomiting.  All other systems reviewed and are negative.    Physical Exam Updated Vital Signs BP (!) 142/95   Pulse 93   Temp 98.2 F (36.8 C)   Resp 18   SpO2 96%   Physical Exam  Constitutional: He is oriented to person, place, and time. He appears well-developed and well-nourished.  HENT:  Head: Normocephalic and atraumatic.  Mouth/Throat: Oropharynx is clear and moist.  Eyes: Pupils are equal, round, and  reactive to light. EOM are normal.  Cardiovascular: Normal rate and regular rhythm.  Pulmonary/Chest: Effort normal and breath sounds normal.  Abdominal: Normal appearance and bowel sounds are normal. There is tenderness in the epigastric area.  Neurological: He is alert and oriented to person, place, and time.  Skin: Skin is warm. Capillary refill takes less than 2 seconds.  Psychiatric: He has a normal mood and affect. His behavior is normal.  Nursing note and vitals reviewed.    ED Treatments / Results  Labs (all labs ordered are listed, but only abnormal results are displayed) Labs Reviewed  CBC WITH DIFFERENTIAL/PLATELET - Abnormal; Notable for the following components:      Result Value   WBC 10.8 (*)    RBC 3.25 (*)    Hemoglobin 7.9 (*)    HCT 25.0 (*)    MCV 76.9 (*)    MCH 24.3 (*)    RDW 18.0 (*)    Monocytes Absolute 1.2 (*)    Eosinophils Absolute 1.1 (*)    All other components within normal limits  COMPREHENSIVE METABOLIC PANEL - Abnormal; Notable for the following components:   Glucose, Bld 103 (*)    All other components within normal limits  LIPASE, BLOOD - Abnormal; Notable for the following components:   Lipase 211 (*)    All other components within normal limits  ETHANOL - Abnormal; Notable for the following components:   Alcohol, Ethyl (B) 74 (*)    All other components within normal limits  RAPID URINE DRUG SCREEN, HOSP PERFORMED - Abnormal; Notable for the following components:   Cocaine POSITIVE (*)    Benzodiazepines POSITIVE (*)    Barbiturates POSITIVE (*)    All other components within normal limits  URINALYSIS, ROUTINE W REFLEX MICROSCOPIC  POC OCCULT BLOOD, ED    EKG None  Radiology No results found.  Procedures Procedures (including critical care time)  Medications Ordered in ED Medications  sodium chloride 0.9 % bolus 1,000 mL (1,000 mLs Intravenous New Bag/Given 09/28/17 2017)  HYDROmorphone (DILAUDID) injection 1 mg (1 mg  Intravenous Given 09/28/17 1742)  ondansetron (ZOFRAN) injection 4 mg (4 mg Intravenous Given 09/28/17 1742)  sodium chloride 0.9 % bolus 1,000 mL (0 mLs Intravenous Stopped 09/28/17 1900)  famotidine (PEPCID)  IVPB 20 mg premix (0 mg Intravenous Stopped 09/28/17 1900)  HYDROmorphone (DILAUDID) injection 1 mg (1 mg Intravenous Given 09/28/17 1901)     Initial Impression / Assessment and Plan / ED Course  I have reviewed the triage vital signs and the nursing notes.  Pertinent labs & imaging results that were available during my care of the patient were reviewed by me and considered in my medical decision making (see chart for details).  Hgb is low, but chronic.  Stool negative for blood.     [name] pain has improved, but it is still bad.  Pt d/w Dr. Denton Brick ([facility]) for admission.   Final Clinical Impressions(s) / ED Diagnoses   Final diagnoses:  Acute on chronic pancreatitis ([place])  Iron deficiency anemia, unspecified iron deficiency anemia type  Polysubstance abuse [facility])    ED Discharge Orders    None       Isla Pence, MD 09/28/17 2030

## 2017-09-28 NOTE — ED Triage Notes (Signed)
Per EMS, patient from home, c/o abdominal pain today after two 24oz beers this morning. Hx pancreatitis.

## 2017-09-28 NOTE — ED Notes (Signed)
Pt refusing to go to floor until pain medicine is administered. RN made aware.

## 2017-09-28 NOTE — H&P (Addendum)
History and Physical    [name] [id number] DOB: [birth year] DOA: 09/28/2017  PCP: Marliss Coots, NP   Patient coming from: Home  Chief Complaint: Abdominal pain, vomiting  HPI: [name] is a 47 y.o. male with medical history significant for chronic pancreatitis, WPW, Poly substance abuse-cocaine, alcohol, THC, tobacco.  Presented to the ED with complaints of abdominal pain that started this morning, after patient drank  Two 24 ounce beers- natural ice, this a.m.  Pain involves his upper abdomen extending slightly to the left.  One episode of nonbloody emesis, no coffee grounds.  No diarrhea, no dysuria or frequency. Patient until 09/26/17 recently was receiving care for his substance abuse at [facility]. On 09/26/19, patient ate french fries, and subsequently developed abdominal pain.  Presented to the ED and was given narcotics for acute pancreatitis, with lipase- 108, and was discharged home from the ED.  Patient pain improved until today but says he was subsequently discharged from rehab program, because of the narcotics.   ED Course: Pressure systolic 269S to 854O.  Otherwise stable vitals.  Lipase elevated to 211. UA- clean, UDS-positive for cocaine benzos and barbiturates.  Alcohol level- 74. WBC- 10.8, Hgb- low 7.9, platelets 236. 2L bolus, dilaudid given in Ed. Hospitalist was called to admit for acute pancreatitis.  Review of Systems: As per HPI otherwise 10 point review of systems negative.   Past Medical History:  Diagnosis Date  . Alcoholism /alcohol abuse ([place])   . Anemia   . Anxiety   . Arthritis    "knees; arms; elbows" (03/26/2015)  . Asthma   . Bipolar disorder ([place])   . Chronic bronchitis ([place])   . Chronic lower back pain   . Chronic pancreatitis ([place])   . Cocaine abuse ([place])   . Depression   . Family history of adverse reaction to anesthesia    "grandmother gets confused"  . Femoral condyle fracture ([place]) 03/08/2014   left  medial/notes 03/09/2014  . GERD (gastroesophageal reflux disease)   . H/O hiatal hernia   . H/O suicide attempt 10/2012  . Heart murmur    "when he was little" (03/06/2013)  . High cholesterol   . History of blood transfusion 10/2012   "when I tried to commit suicide"  . History of stomach ulcers   . Hypertension   . Marijuana abuse, continuous   . Migraine    "a few times/year" (03/26/2015)  . Pneumonia 1990's X 3  . PTSD (post-traumatic stress disorder)   . Shortness of breath    "can happen at anytime" (03/06/2013)  . Sickle cell trait ([place])   . WPW (Wolff-Parkinson-White syndrome)    [name] 03/06/2013    Past Surgical History:  Procedure Laterality Date  . CARDIAC CATHETERIZATION    . EYE SURGERY Left 1990's   "result of trauma"   . FACIAL FRACTURE SURGERY Left 1990's   "result of trauma"   . FRACTURE SURGERY    . HERNIA REPAIR    . LEFT HEART CATHETERIZATION WITH CORONARY ANGIOGRAM Right 03/07/2013   Procedure: LEFT HEART CATHETERIZATION WITH CORONARY ANGIOGRAM;  Surgeon: [name], MD;  Location: [facility] CATH LAB;  Service: Cardiovascular;  Laterality: Right;  . UMBILICAL HERNIA REPAIR       reports that he has been smoking cigarettes and e-cigarettes.  He has a 33.00 pack-year smoking history. He has never used smokeless tobacco. He reports that he drinks alcohol. He reports that he has current or past drug history.  Drugs: Marijuana and Cocaine.  Allergies  Allergen Reactions  . Robaxin [Methocarbamol] Other (See Comments)    "jumpy limbs"  . Shellfish-Derived Products Nausea And Vomiting  . Trazodone And Nefazodone Other (See Comments)    Reaction:  Muscle spasms  . Contrast Media [Iodinated Diagnostic Agents] Hives  . Reglan [Metoclopramide] Other (See Comments)    Reaction:  Muscle spasms    Family History  Problem Relation Age of Onset  . Hypertension Mother   . Cirrhosis Mother   . Alcoholism Mother   . Hypertension Father   . Melanoma Father   .  Hypertension Other   . Coronary artery disease Other     Prior to Admission medications   Medication Sig Start Date End Date Taking? Authorizing Provider  albuterol (PROVENTIL HFA;VENTOLIN HFA) 108 (90 Base) MCG/ACT inhaler Inhale 2 puffs into the lungs every 6 (six) hours as needed for wheezing or shortness of breath. 08/29/17  Yes Clent Demark, PA-C  folic acid (FOLVITE) 1 MG tablet Take 1 tablet (1 mg total) by mouth daily. 09/06/17  Yes Dana Allan I, MD  hydrALAZINE (APRESOLINE) 25 MG tablet Take 1 tablet (25 mg total) by mouth every 8 (eight) hours. Patient taking differently: Take 25 mg by mouth at bedtime.  08/29/17  Yes Clent Demark, PA-C  lipase/protease/amylase (CREON) 12000 units CPEP capsule Take 2 capsules (24,000 Units total) by mouth 3 (three) times daily with meals. For pancreatitis Patient taking differently: Take 12,000 Units by mouth 3 (three) times daily with meals. For pancreatitis 06/06/17  Yes [name], MD  loratadine (CLARITIN) 10 MG tablet Take 1 tablet (10 mg total) by mouth daily. (May purchase from over the counter): For allergies Patient taking differently: Take 10 mg by mouth daily as needed for allergies or rhinitis. (May purchase from over the counter): For allergies 08/29/17  Yes Clent Demark, PA-C  metoprolol tartrate (LOPRESSOR) 25 MG tablet Take 1 tablet (25 mg total) by mouth 2 (two) times daily. For high blood pressure 08/29/17  Yes Clent Demark, PA-C  Multiple Vitamin (MULTIVITAMIN WITH MINERALS) TABS tablet Take 1 tablet by mouth daily. 09/06/17  Yes Bonnell Public, MD  ondansetron (ZOFRAN) 4 MG tablet Take 4 mg by mouth 2 (two) times daily as needed for nausea/vomiting. 09/19/17  Yes [provider]  oxyCODONE (ROXICODONE) 5 MG immediate release tablet Take 1 tablet (5 mg total) by mouth every 8 (eight) hours as needed for up to 15 doses for severe pain. 09/06/17  Yes Dana Allan I, MD  pantoprazole (PROTONIX) 40  MG tablet Take 1 tablet (40 mg total) by mouth daily. 08/29/17  Yes Clent Demark, PA-C  sucralfate (CARAFATE) 1 g tablet Take 1 tablet by mouth 4 (four) times daily. 09/19/17  Yes [provider]  chlordiazePOXIDE (LIBRIUM) 25 MG capsule 50mg  PO TID x 1D, then 25-50mg  PO BID X 1D, then 25-50mg  PO QD X 1D Patient not taking: Reported on 09/28/2017 09/15/17   Caccavale, Sophia, PA-C  sertraline (ZOLOFT) 100 MG tablet Take 1 tablet (100 mg total) by mouth daily. Patient not taking: Reported on 09/15/2017 08/29/17   Clent Demark, PA-C  sucralfate (CARAFATE) 1 GM/10ML suspension Take 10 mLs (1 g total) by mouth 4 (four) times daily -  with meals and at bedtime. Patient not taking: Reported on 09/28/2017 09/15/17   Maczis, Barth Kirks, PA-C  thiamine 100 MG tablet Take 1 tablet (100 mg total) by mouth daily. Patient not taking: Reported  on 09/28/2017 08/29/17   Clent Demark, PA-C  omeprazole (PRILOSEC) 20 MG capsule Take 1 capsule (20 mg total) daily by mouth. 03/22/17 06/21/17  Varney Biles, MD    Physical Exam: Vitals:   09/28/17 1708 09/28/17 1840 09/28/17 1900 09/28/17 2129  BP: (!) 155/93 (!) 148/95 (!) 142/95 (!) 146/96  Pulse: 99 99 93 98  Resp: 18 20 18 18   Temp: 98.2 F (36.8 C)     SpO2: 99% 100% 96% 100%    Constitutional: NAD, calm, comfortable Vitals:   09/28/17 1708 09/28/17 1840 09/28/17 1900 09/28/17 2129  BP: (!) 155/93 (!) 148/95 (!) 142/95 (!) 146/96  Pulse: 99 99 93 98  Resp: 18 20 18 18   Temp: 98.2 F (36.8 C)     SpO2: 99% 100% 96% 100%   Eyes: PERRL, lids and conjunctivae normal ENMT: Mucous membranes are moist. Posterior pharynx clear of any exudate or lesions.Normal dentition.  Neck: normal, supple, no masses, no thyromegaly Respiratory: clear to auscultation bilaterally, no wheezing, no crackles. Normal respiratory effort. No accessory muscle use.  Cardiovascular: Regular rate and rhythm, no murmurs / rubs / gallops. No extremity edema. 2+  pedal pulses. No carotid bruits.  Abdomen: Mild Epigastric tenderness, soft, no masses palpated. No hepatosplenomegaly. Bowel sounds positive.  Musculoskeletal: no clubbing / cyanosis. No joint deformity upper and lower extremities. Good ROM, no contractures. Normal muscle tone.  Skin: no rashes, lesions, ulcers. No induration Neurologic: CN 2-12 grossly intact.  Strength 5/5 in all 4.  Psychiatric: Normal judgment and insight. Alert and oriented x 3. Normal mood.   Labs on Admission: I have personally reviewed following labs and imaging studies  CBC: Recent Labs  Lab 09/25/17 1944 09/28/17 1742  WBC 9.9 10.8*  NEUTROABS  --  4.6  HGB 8.3* 7.9*  HCT 25.1* 25.0*  MCV 75.6* 76.9*  PLT 317 825   Basic Metabolic Panel: Recent Labs  Lab 09/25/17 1944 09/28/17 1742  NA 139 142  K 4.1 3.8  CL 104 111  CO2 26 22  GLUCOSE 149* 103*  BUN 8 10  CREATININE 0.86 1.00  CALCIUM 9.1 9.0   Liver Function Tests: Recent Labs  Lab 09/25/17 1944 09/28/17 1742  AST 46* 31  ALT 35 28  ALKPHOS 64 67  BILITOT 0.2* 0.5  PROT 7.7 7.5  ALBUMIN 3.9 3.6   Recent Labs  Lab 09/25/17 1944 09/28/17 1742  LIPASE 108* 211*   Urine analysis:    Component Value Date/Time   COLORURINE YELLOW 09/28/2017 [place] 09/28/2017 1722   LABSPEC 1.012 09/28/2017 1722   PHURINE 5.0 09/28/2017 1722   GLUCOSEU NEGATIVE 09/28/2017 1722   HGBUR NEGATIVE 09/28/2017 1722   HGBUR negative 04/30/2010 1020   BILIRUBINUR NEGATIVE 09/28/2017 1722   KETONESUR NEGATIVE 09/28/2017 1722   PROTEINUR NEGATIVE 09/28/2017 1722   UROBILINOGEN 0.2 03/15/2015 0508   NITRITE NEGATIVE 09/28/2017 1722   LEUKOCYTESUR NEGATIVE 09/28/2017 1722    Radiological Exams on Admission: No results found.  EKG:  None   Assessment/Plan Active Problems:   [name] Parkinson White pattern seen on electrocardiogram   Alcohol-induced acute pancreatitis   Alcohol-induced chronic pancreatitis (HCC)   Major  depressive disorder, recurrent severe without psychotic features ([place])   Alcohol use disorder, severe, dependence ([place])   Acute pancreatitis   Acute on chronic Pancreatitis- Abdominal pain, vomiting, lipase- 211, elevated compared to prior.  Likely provoked by alcohol abuse. WBC- 10.8.  Will hold off on imaging at this  time. - Lactated ringers + 20 KCL 100cc/hr X 1 day - Dilaudid 0.5 mg q3h prn - Bowel rest - NPO  -Pepcid -Cont home Creon suppliments  WPW- no chest pain, no palpitations, no tachycardia. - tele monitoring  Alcohol dependence-serum alcohol level- 74.   Last drink this a.m, beer.  - CIWA -Multi-Vitamins folate thiamine - Mag check- 1.6, replete  Chronic anemia- hgb 7.9.,  Baseline over the past few months 8-10, intermitently 12, low MCV suggests iron deficiency. Denies blood loss.  Alcoholic.  Other cell count indices normal -Anemia panel a.m. - CBC a.m  HTN- Stable BP, - Cont home metop - patient taking hydralazine only at night, can start at prescribed dosing regimen pending blood pressure.  Depression -Continue home Zoloft  DVT prophylaxis: lovenox Code Status:  full Family Communication: none at bedside Disposition Plan: per rounding team Consults called:  none Admission status:  Obs, tele- WPW.   Bethena Roys MD [facility] Pager [phone] From 6PM-2AM.  Otherwise please contact night-coverage [email or web address] Password [facility]  09/28/2017, 9:58 PM

## 2017-09-28 NOTE — ED Notes (Signed)
Bed: WA17 Expected date:  Expected time:  Means of arrival:  Comments: 47 yo m abd pain

## 2017-09-29 ENCOUNTER — Other Ambulatory Visit: Payer: Self-pay

## 2017-09-29 DIAGNOSIS — F1020 Alcohol dependence, uncomplicated: Secondary | ICD-10-CM

## 2017-09-29 DIAGNOSIS — F1910 Other psychoactive substance abuse, uncomplicated: Secondary | ICD-10-CM

## 2017-09-29 DIAGNOSIS — K859 Acute pancreatitis without necrosis or infection, unspecified: Secondary | ICD-10-CM

## 2017-09-29 DIAGNOSIS — F332 Major depressive disorder, recurrent severe without psychotic features: Secondary | ICD-10-CM

## 2017-09-29 DIAGNOSIS — I456 Pre-excitation syndrome: Secondary | ICD-10-CM

## 2017-09-29 DIAGNOSIS — K861 Other chronic pancreatitis: Secondary | ICD-10-CM

## 2017-09-29 LAB — CBC
HEMATOCRIT: 25.5 % — AB (ref 39.0–52.0)
HEMOGLOBIN: 7.9 g/dL — AB (ref 13.0–17.0)
MCH: 24 pg — AB (ref 26.0–34.0)
MCHC: 31 g/dL (ref 30.0–36.0)
MCV: 77.5 fL — ABNORMAL LOW (ref 78.0–100.0)
Platelets: 207 10*3/uL (ref 150–400)
RBC: 3.29 MIL/uL — ABNORMAL LOW (ref 4.22–5.81)
RDW: 18.4 % — AB (ref 11.5–15.5)
WBC: 11.1 10*3/uL — ABNORMAL HIGH (ref 4.0–10.5)

## 2017-09-29 LAB — IRON AND TIBC
IRON: 36 ug/dL — AB (ref 45–182)
SATURATION RATIOS: 7 % — AB (ref 17.9–39.5)
TIBC: 525 ug/dL — ABNORMAL HIGH (ref 250–450)
UIBC: 489 ug/dL

## 2017-09-29 LAB — FOLATE: FOLATE: 11 ng/mL (ref >5.9)

## 2017-09-29 LAB — MAGNESIUM: Magnesium: 2.2 mg/dL (ref 1.7–2.4)

## 2017-09-29 LAB — RETICULOCYTES
RBC.: 3.29 MIL/uL — ABNORMAL LOW (ref 4.22–5.81)
Retic Count, Absolute: 42.8 10*3/uL (ref 19.0–186.0)
Retic Ct Pct: 1.3 % (ref 0.4–3.1)

## 2017-09-29 LAB — VITAMIN B12: Vitamin B-12: 253 pg/mL (ref 180–914)

## 2017-09-29 LAB — FERRITIN: FERRITIN: 13 ng/mL — AB (ref 24–336)

## 2017-09-29 MED ORDER — FAMOTIDINE IN NACL 20-0.9 MG/50ML-% IV SOLN
20.0000 mg | Freq: Two times a day (BID) | INTRAVENOUS | Status: DC
  Administered 2017-09-29 – 2017-10-01 (×5): 20 mg via INTRAVENOUS
  Filled 2017-09-29 (×5): qty 50

## 2017-09-29 MED ORDER — NICOTINE POLACRILEX 2 MG MT GUM
2.0000 mg | CHEWING_GUM | OROMUCOSAL | Status: DC | PRN
  Administered 2017-09-29: 2 mg via ORAL
  Filled 2017-09-29: qty 1

## 2017-09-29 MED ORDER — MUSCLE RUB 10-15 % EX CREA
TOPICAL_CREAM | CUTANEOUS | Status: DC | PRN
  Administered 2017-09-29: 12:00:00 via TOPICAL
  Filled 2017-09-29: qty 85

## 2017-09-29 MED ORDER — OXYCODONE HCL 5 MG PO TABS
5.0000 mg | ORAL_TABLET | Freq: Four times a day (QID) | ORAL | Status: DC | PRN
  Administered 2017-09-29 – 2017-09-30 (×2): 5 mg via ORAL
  Filled 2017-09-29 (×2): qty 1

## 2017-09-29 MED ORDER — MAGNESIUM SULFATE 2 GM/50ML IV SOLN
2.0000 g | Freq: Once | INTRAVENOUS | Status: AC
  Administered 2017-09-29: 2 g via INTRAVENOUS
  Filled 2017-09-29: qty 50

## 2017-09-29 MED ORDER — GI COCKTAIL ~~LOC~~
30.0000 mL | Freq: Three times a day (TID) | ORAL | Status: DC | PRN
  Administered 2017-09-29 – 2017-10-01 (×6): 30 mL via ORAL
  Filled 2017-09-29 (×6): qty 30

## 2017-09-29 MED ORDER — HYDROMORPHONE HCL 1 MG/ML IJ SOLN
0.5000 mg | INTRAMUSCULAR | Status: DC | PRN
  Administered 2017-09-29 – 2017-09-30 (×6): 0.5 mg via INTRAVENOUS
  Filled 2017-09-29 (×6): qty 0.5

## 2017-09-29 NOTE — Progress Notes (Signed)
0500- pt opened the door to his room to request more ice chips and a strong odor of cigarette smoke was noted, I entered to find he had opened the room window & initially denied smoking. I had seen the cigarettes in his backpack on arrival to the room and advised those were not to be smoked inside the hospital. Partial pack of cigarettes & a lighter have been removed from patients possession as well as 10 bottles ox Rx meds. Advised pt he could be fined & face serious problems d/t this if he tried it again.

## 2017-09-29 NOTE — Progress Notes (Signed)
Patient ID: [name], male   DOB: [birth year], 47 y.o.   MRN: [id number]  PROGRESS NOTE    Cohen Boettner  ERD:[id number] DOB: 11/10/1970 DOA: 09/28/2017 PCP: Marliss Coots, NP   Brief Narrative:  47 year old male with history of chronic parotitis, WPW, polysubstance abuse including cocaine/alcohol/ tetrahydrocannabinol/tobacco presented with complaints of abdominal pain and vomiting after drinking alcohol.  Lipase was elevated at 211.  UDS was positive for cocaine, benzos and barbiturates.  Patient was admitted for pancreatitis and started on IV fluids.   Assessment & Plan:   Active Problems:   [name] White pattern seen on electrocardiogram   Alcohol-induced acute pancreatitis   Alcohol-induced chronic pancreatitis (HCC)   Major depressive disorder, recurrent severe without psychotic features ([place])   Alcohol use disorder, severe, dependence ([place])   Acute pancreatitis  Acute on chronic pancreatitis -Probably alcohol induced. -Increase IV fluids to 150 cc an hour -Antiemetics as needed -Counseled about abstinence from alcohol -We will hold off on imaging studies for now -Start clear liquid diet for lunch and advance if tolerated -Continue pain management  Alcohol dependence and abuse -We will monitor for signs of withdrawal.  Continue CIWA protocol.  Continue multivitamins, thiamine and folate  Leukocytosis -Probably reactive.  Monitor.  History of WPW syndrome -Continue telemetry monitoring.  No signs of chest pain or tachycardia  Chronic anemia -Outpatient follow-up with PCP and might need GI evaluation.  Hemoglobin stable for now.  No signs of bleeding  Hypertension -Monitor blood pressure.  Continue metoprolol.  Restart hydralazine  Depression -Continue Zoloft  Polysubstance abuse -Urine drug screen positive for barbiturates, benzodiazepines and cocaine.  Counseled about cessation.  Social worker consult  DVT prophylaxis: Lovenox Code  Status: Full Family Communication: None at bedside Disposition Plan: Home in 1 to 2 days  Consultants: None Procedures: None  Antimicrobials: None  Subjective: Patient seen and examined at bedside.  No overnight vomiting reported.  Complains of intermittent abdominal pain.  No overnight fever  Objective: Vitals:   09/29/17 0300 09/29/17 0308 09/29/17 0614 09/29/17 0744  BP: (!) 145/99 (!) 145/99 (!) 159/97 (!) 159/79  Pulse: 72 72 71 71  Resp:   18 18  Temp:   97.8 F (36.6 C) 97.8 F (36.6 C)  TempSrc:   Oral Oral  SpO2:  100% 100%   Weight:    63.6 kg (140 lb 3.4 oz)  Height:    5' 9.02" (1.753 m)    Intake/Output Summary (Last 24 hours) at 09/29/2017 1045 Last data filed at 09/29/2017 0744 Gross per 24 hour  Intake 781.67 ml  Output -  Net 781.67 ml   Filed Weights   09/28/17 2340 09/29/17 0744  Weight: 63.6 kg (140 lb 3.4 oz) 63.6 kg (140 lb 3.4 oz)    Examination:  General exam: Appears calm and comfortable  Respiratory system: Bilateral decreased breath sound at bases Cardiovascular system: S1 & S2 heard, rate controlled  gastrointestinal system: Abdomen is nondistended, soft and mildly tender in the epigastric and umbilical regions. Normal bowel sounds heard. Extremities: No cyanosis, clubbing, edema    Data Reviewed: I have personally reviewed following labs and imaging studies  CBC: Recent Labs  Lab 09/25/17 1944 09/28/17 1742 09/29/17 0557  WBC 9.9 10.8* 11.1*  NEUTROABS  --  4.6  --   HGB 8.3* 7.9* 7.9*  HCT 25.1* 25.0* 25.5*  MCV 75.6* 76.9* 77.5*  PLT 317 236 563   Basic Metabolic Panel: Recent Labs  Lab 09/25/17  1944 09/28/17 1742 09/28/17 2158 09/29/17 0557  NA 139 142  --   --   K 4.1 3.8  --   --   CL 104 111  --   --   CO2 26 22  --   --   GLUCOSE 149* 103*  --   --   BUN 8 10  --   --   CREATININE 0.86 1.00  --   --   CALCIUM 9.1 9.0  --   --   MG  --   --  1.6* 2.2   GFR: Estimated Creatinine Clearance: 81.3 mL/min (by  C-G formula based on SCr of 1 mg/dL). Liver Function Tests: Recent Labs  Lab 09/25/17 1944 09/28/17 1742  AST 46* 31  ALT 35 28  ALKPHOS 64 67  BILITOT 0.2* 0.5  PROT 7.7 7.5  ALBUMIN 3.9 3.6   Recent Labs  Lab 09/25/17 1944 09/28/17 1742  LIPASE 108* 211*   No results for input(s): AMMONIA in the last 168 hours. Coagulation Profile: No results for input(s): INR, PROTIME in the last 168 hours. Cardiac Enzymes: No results for input(s): CKTOTAL, CKMB, CKMBINDEX, TROPONINI in the last 168 hours. BNP (last 3 results) No results for input(s): PROBNP in the last 8760 hours. HbA1C: No results for input(s): HGBA1C in the last 72 hours. CBG: No results for input(s): GLUCAP in the last 168 hours. Lipid Profile: No results for input(s): CHOL, HDL, LDLCALC, TRIG, CHOLHDL, LDLDIRECT in the last 72 hours. Thyroid Function Tests: No results for input(s): TSH, T4TOTAL, FREET4, T3FREE, THYROIDAB in the last 72 hours. Anemia Panel: Recent Labs    09/29/17 0557  VITAMINB12 253  FOLATE 11.0  FERRITIN 13*  TIBC 525*  IRON 36*  RETICCTPCT 1.3   Sepsis Labs: No results for input(s): PROCALCITON, LATICACIDVEN in the last 168 hours.  No results found for this or any previous visit (from the past 240 hour(s)).       Radiology Studies: No results found.      Scheduled Meds: . enoxaparin (LOVENOX) injection  40 mg Subcutaneous QHS  . folic acid  1 mg Oral Daily  . lipase/protease/amylase  12,000 Units Oral TID WC  . LORazepam  0-4 mg Oral Q6H   Followed by  . [START ON 10/01/2017] LORazepam  0-4 mg Oral Q12H  . metoprolol tartrate  25 mg Oral BID  . multivitamin with minerals  1 tablet Oral Daily  . sertraline  100 mg Oral Daily  . thiamine  100 mg Oral Daily   Or  . thiamine  100 mg Intravenous Daily   Continuous Infusions: . famotidine (PEPCID) IV Stopped (09/29/17 0837)  . lactated ringers with kcl 100 mL/hr at 09/29/17 0025     LOS: 0 days        Aline August, MD [facility] Pager [phone]  If 7PM-7AM, please contact night-coverage [email or web address] Password [email or web address] 09/29/2017, 10:45 AM

## 2017-09-30 DIAGNOSIS — D509 Iron deficiency anemia, unspecified: Secondary | ICD-10-CM

## 2017-09-30 LAB — CBC WITH DIFFERENTIAL/PLATELET
BASOS ABS: 0 10*3/uL (ref 0.0–0.1)
Basophils Relative: 0 %
Eosinophils Absolute: 1 10*3/uL — ABNORMAL HIGH (ref 0.0–0.7)
Eosinophils Relative: 10 %
HCT: 24.7 % — ABNORMAL LOW (ref 39.0–52.0)
Hemoglobin: 7.6 g/dL — ABNORMAL LOW (ref 13.0–17.0)
LYMPHS ABS: 3.4 10*3/uL (ref 0.7–4.0)
Lymphocytes Relative: 33 %
MCH: 24 pg — ABNORMAL LOW (ref 26.0–34.0)
MCHC: 30.8 g/dL (ref 30.0–36.0)
MCV: 77.9 fL — ABNORMAL LOW (ref 78.0–100.0)
MONO ABS: 1.3 10*3/uL — AB (ref 0.1–1.0)
Monocytes Relative: 13 %
NEUTROS PCT: 44 %
Neutro Abs: 4.5 10*3/uL (ref 1.7–7.7)
Platelets: 185 10*3/uL (ref 150–400)
RBC: 3.17 MIL/uL — ABNORMAL LOW (ref 4.22–5.81)
RDW: 18.3 % — AB (ref 11.5–15.5)
WBC: 10.2 10*3/uL (ref 4.0–10.5)

## 2017-09-30 LAB — COMPREHENSIVE METABOLIC PANEL
ALK PHOS: 67 U/L (ref 38–126)
ALT: 22 U/L (ref 17–63)
AST: 24 U/L (ref 15–41)
Albumin: 3.6 g/dL (ref 3.5–5.0)
Anion gap: 11 (ref 5–15)
BUN: 5 mg/dL — AB (ref 6–20)
CALCIUM: 8.9 mg/dL (ref 8.9–10.3)
CHLORIDE: 105 mmol/L (ref 101–111)
CO2: 23 mmol/L (ref 22–32)
CREATININE: 0.78 mg/dL (ref 0.61–1.24)
GFR calc non Af Amer: >60 mL/min (ref >60)
GLUCOSE: 110 mg/dL — AB (ref 65–99)
Potassium: 4.3 mmol/L (ref 3.5–5.1)
SODIUM: 139 mmol/L (ref 135–145)
Total Bilirubin: 0.6 mg/dL (ref 0.3–1.2)
Total Protein: 7.1 g/dL (ref 6.5–8.1)

## 2017-09-30 LAB — MAGNESIUM: Magnesium: 1.5 mg/dL — ABNORMAL LOW (ref 1.7–2.4)

## 2017-09-30 MED ORDER — MAGNESIUM SULFATE 2 GM/50ML IV SOLN
2.0000 g | Freq: Once | INTRAVENOUS | Status: AC
  Administered 2017-09-30: 2 g via INTRAVENOUS
  Filled 2017-09-30: qty 50

## 2017-09-30 MED ORDER — POTASSIUM CHLORIDE 2 MEQ/ML IV SOLN
INTRAVENOUS | Status: DC
  Administered 2017-09-30 – 2017-10-01 (×3): via INTRAVENOUS
  Filled 2017-09-30 (×4): qty 1000

## 2017-09-30 MED ORDER — HYDROMORPHONE HCL 1 MG/ML IJ SOLN
0.5000 mg | INTRAMUSCULAR | Status: DC | PRN
  Administered 2017-09-30 – 2017-10-01 (×6): 0.5 mg via INTRAVENOUS
  Filled 2017-09-30 (×6): qty 0.5

## 2017-09-30 MED ORDER — POTASSIUM CHLORIDE 2 MEQ/ML IV SOLN: INTRAVENOUS | Status: DC

## 2017-09-30 MED ORDER — HYDRALAZINE HCL 25 MG PO TABS
25.0000 mg | ORAL_TABLET | Freq: Three times a day (TID) | ORAL | Status: DC
  Administered 2017-09-30 – 2017-10-01 (×3): 25 mg via ORAL
  Filled 2017-09-30 (×3): qty 1

## 2017-09-30 MED ORDER — POTASSIUM CHLORIDE 2 MEQ/ML IV SOLN
INTRAVENOUS | Status: AC
  Administered 2017-09-30: 08:00:00 via INTRAVENOUS
  Filled 2017-09-30 (×2): qty 1000

## 2017-09-30 MED ORDER — OXYCODONE HCL 5 MG PO TABS
5.0000 mg | ORAL_TABLET | ORAL | Status: DC | PRN
  Administered 2017-09-30 – 2017-10-01 (×4): 5 mg via ORAL
  Filled 2017-09-30 (×5): qty 1

## 2017-09-30 NOTE — Progress Notes (Signed)
Patient ID: [name], male   DOB: 1971/04/30, 47 y.o.   MRN: [id number]  PROGRESS NOTE    [name]  VOJ:[id number] DOB: April 06, 1971 DOA: 09/28/2017 PCP: Marliss Coots, NP   Brief Narrative:  47 year old male with history of chronic parotitis, WPW, polysubstance abuse including cocaine/alcohol/ tetrahydrocannabinol/tobacco presented with complaints of abdominal pain and vomiting after drinking alcohol.  Lipase was elevated at 211.  UDS was positive for cocaine, benzos and barbiturates.  Patient was admitted for pancreatitis and started on IV fluids.   Assessment & Plan:   Active Problems:   [name] White pattern seen on electrocardiogram   Alcohol-induced acute pancreatitis   Alcohol-induced chronic pancreatitis (HCC)   Major depressive disorder, recurrent severe without psychotic features ([place])   Alcohol use disorder, severe, dependence ([place])   Acute pancreatitis  Acute on chronic pancreatitis -Probably alcohol induced. -Decrease IV fluids to 100 cc an hour -Antiemetics as needed -Counseled about abstinence from alcohol -Patient is tolerating clear liquid diet but states that still is in a lot of pain.  Patient has not vomited overnight as per the nursing staff.  Will advance diet to full liquid diet.  Hold off on imaging for now.  Alcohol dependence and abuse - Continue CIWA protocol.  Continue multivitamins, thiamine and folate  Leukocytosis  -Probably reactive.  Resolved  History of WPW syndrome -Continue telemetry monitoring.  No signs of chest pain or tachycardia  Chronic anemia -Outpatient follow-up with PCP and might need GI evaluation.  Hemoglobin stable for now.  No signs of bleeding.  Repeat a.m. labs  Hypertension -Monitor blood pressure.  Continue metoprolol.  Restart hydralazine  Depression -Continue Zoloft  Polysubstance abuse -Urine drug screen positive for barbiturates, benzodiazepines and cocaine.  Counseled about  cessation.  Social worker consult  Hypomagnesemia -Replace.  Repeat a.m. labs  DVT prophylaxis: Lovenox Code Status: Full Family Communication: None at bedside Disposition Plan: Home in 1 to 2 days  Consultants: None Procedures: None  Antimicrobials: None  Subjective: Patient seen and examined at bedside.  Patient still complains of intermittent abdominal pain.  No overnight vomiting reported by the nursing staff. Objective: Vitals:   09/29/17 1421 09/29/17 2036 09/30/17 0009 09/30/17 0517  BP: (!) 163/106 (!) 138/100 (!) 132/91 (!) 145/92  Pulse: 71 72 75 77  Resp:  12 18 20   Temp: (!) 97.4 F (36.3 C) 98.3 F (36.8 C) 98.1 F (36.7 C) 97.8 F (36.6 C)  TempSrc: Oral Oral Oral Oral  SpO2: 100% 99% 99% 100%  Weight:      Height:        Intake/Output Summary (Last 24 hours) at 09/30/2017 0953 Last data filed at 09/30/2017 0600 Gross per 24 hour  Intake 3581.67 ml  Output -  Net 3581.67 ml   Filed Weights   09/28/17 2340 09/29/17 0744  Weight: 63.6 kg (140 lb 3.4 oz) 63.6 kg (140 lb 3.4 oz)    Examination:  General exam: He appears slightly drowsy but wakes up and states that he is having pain.   Respiratory system: Bilateral decreased breath sound at bases, no wheezing Cardiovascular system: Rate controlled, S1-S2 heard gastrointestinal system: Abdomen is nondistended, soft and mildly tender in the epigastric and umbilical regions. Normal bowel sounds heard. Extremities: No cyanosis, edema    Data Reviewed: I have personally reviewed following labs and imaging studies  CBC: Recent Labs  Lab 09/25/17 1944 09/28/17 1742 09/29/17 0557 09/30/17 0537  WBC 9.9 10.8* 11.1* 10.2  NEUTROABS  --  4.6  --  4.5  HGB 8.3* 7.9* 7.9* 7.6*  HCT 25.1* 25.0* 25.5* 24.7*  MCV 75.6* 76.9* 77.5* 77.9*  PLT 317 236 207 759   Basic Metabolic Panel: Recent Labs  Lab 09/25/17 1944 09/28/17 1742 09/28/17 2158 09/29/17 0557 09/30/17 0537  NA 139 142  --   --  139  K  4.1 3.8  --   --  4.3  CL 104 111  --   --  105  CO2 26 22  --   --  23  GLUCOSE 149* 103*  --   --  110*  BUN 8 10  --   --  5*  CREATININE 0.86 1.00  --   --  0.78  CALCIUM 9.1 9.0  --   --  8.9  MG  --   --  1.6* 2.2 1.5*   GFR: Estimated Creatinine Clearance: 101.6 mL/min (by C-G formula based on SCr of 0.78 mg/dL). Liver Function Tests: Recent Labs  Lab 09/25/17 1944 09/28/17 1742 09/30/17 0537  AST 46* 31 24  ALT 35 28 22  ALKPHOS 64 67 67  BILITOT 0.2* 0.5 0.6  PROT 7.7 7.5 7.1  ALBUMIN 3.9 3.6 3.6   Recent Labs  Lab 09/25/17 1944 09/28/17 1742  LIPASE 108* 211*   No results for input(s): AMMONIA in the last 168 hours. Coagulation Profile: No results for input(s): INR, PROTIME in the last 168 hours. Cardiac Enzymes: No results for input(s): CKTOTAL, CKMB, CKMBINDEX, TROPONINI in the last 168 hours. BNP (last 3 results) No results for input(s): PROBNP in the last 8760 hours. HbA1C: No results for input(s): HGBA1C in the last 72 hours. CBG: No results for input(s): GLUCAP in the last 168 hours. Lipid Profile: No results for input(s): CHOL, HDL, LDLCALC, TRIG, CHOLHDL, LDLDIRECT in the last 72 hours. Thyroid Function Tests: No results for input(s): TSH, T4TOTAL, FREET4, T3FREE, THYROIDAB in the last 72 hours. Anemia Panel: Recent Labs    09/29/17 0557  VITAMINB12 253  FOLATE 11.0  FERRITIN 13*  TIBC 525*  IRON 36*  RETICCTPCT 1.3   Sepsis Labs: No results for input(s): PROCALCITON, LATICACIDVEN in the last 168 hours.  No results found for this or any previous visit (from the past 240 hour(s)).       Radiology Studies: No results found.      Scheduled Meds: . enoxaparin (LOVENOX) injection  40 mg Subcutaneous QHS  . folic acid  1 mg Oral Daily  . lipase/protease/amylase  12,000 Units Oral TID WC  . LORazepam  0-4 mg Oral Q6H   Followed by  . [START ON 10/01/2017] LORazepam  0-4 mg Oral Q12H  . metoprolol tartrate  25 mg Oral BID  .  multivitamin with minerals  1 tablet Oral Daily  . sertraline  100 mg Oral Daily  . thiamine  100 mg Oral Daily   Or  . thiamine  100 mg Intravenous Daily   Continuous Infusions: . famotidine (PEPCID) IV Stopped (09/29/17 2125)     LOS: 1 day        Aline August, MD [facility] Pager [phone]  If 7PM-7AM, please contact night-coverage [email or web address] Password [facility] 09/30/2017, 9:53 AM

## 2017-10-01 LAB — COMPREHENSIVE METABOLIC PANEL
ALBUMIN: 4 g/dL (ref 3.5–5.0)
ALT: 24 U/L (ref 17–63)
ANION GAP: 11 (ref 5–15)
AST: 30 U/L (ref 15–41)
Alkaline Phosphatase: 78 U/L (ref 38–126)
BILIRUBIN TOTAL: 0.7 mg/dL (ref 0.3–1.2)
CHLORIDE: 102 mmol/L (ref 101–111)
CO2: 23 mmol/L (ref 22–32)
Calcium: 9.4 mg/dL (ref 8.9–10.3)
Creatinine, Ser: 0.75 mg/dL (ref 0.61–1.24)
GFR calc Af Amer: >60 mL/min (ref >60)
GFR calc non Af Amer: >60 mL/min (ref >60)
Glucose, Bld: 90 mg/dL (ref 65–99)
POTASSIUM: 4.8 mmol/L (ref 3.5–5.1)
SODIUM: 136 mmol/L (ref 135–145)
TOTAL PROTEIN: 8.2 g/dL — AB (ref 6.5–8.1)

## 2017-10-01 LAB — CBC WITH DIFFERENTIAL/PLATELET
BASOS ABS: 0 10*3/uL (ref 0.0–0.1)
Basophils Relative: 0 %
Eosinophils Absolute: 1 10*3/uL — ABNORMAL HIGH (ref 0.0–0.7)
Eosinophils Relative: 11 %
HEMATOCRIT: 27.5 % — AB (ref 39.0–52.0)
Hemoglobin: 8.3 g/dL — ABNORMAL LOW (ref 13.0–17.0)
LYMPHS ABS: 2.8 10*3/uL (ref 0.7–4.0)
Lymphocytes Relative: 32 %
MCH: 23.6 pg — ABNORMAL LOW (ref 26.0–34.0)
MCHC: 30.2 g/dL (ref 30.0–36.0)
MCV: 78.3 fL (ref 78.0–100.0)
MONOS PCT: 14 %
Monocytes Absolute: 1.2 10*3/uL — ABNORMAL HIGH (ref 0.1–1.0)
Neutro Abs: 3.8 10*3/uL (ref 1.7–7.7)
Neutrophils Relative %: 43 %
PLATELETS: 169 10*3/uL (ref 150–400)
RBC: 3.51 MIL/uL — AB (ref 4.22–5.81)
RDW: 18.6 % — AB (ref 11.5–15.5)
WBC: 8.8 10*3/uL (ref 4.0–10.5)

## 2017-10-01 LAB — MAGNESIUM: MAGNESIUM: 1.5 mg/dL — AB (ref 1.7–2.4)

## 2017-10-01 MED ORDER — MAGNESIUM SULFATE 2 GM/50ML IV SOLN
2.0000 g | Freq: Once | INTRAVENOUS | Status: AC
  Administered 2017-10-01: 2 g via INTRAVENOUS
  Filled 2017-10-01: qty 50

## 2017-10-01 MED ORDER — OXYCODONE HCL 5 MG PO TABS
5.0000 mg | ORAL_TABLET | Freq: Four times a day (QID) | ORAL | Status: DC | PRN
  Administered 2017-10-01: 5 mg via ORAL
  Filled 2017-10-01: qty 1

## 2017-10-01 MED ORDER — FAMOTIDINE 20 MG PO TABS: 20.0000 mg | ORAL_TABLET | Freq: Two times a day (BID) | ORAL | Status: DC

## 2017-10-01 MED ORDER — LORATADINE 10 MG PO TABS: 10.0000 mg | ORAL_TABLET | Freq: Every day | ORAL | Status: DC | PRN

## 2017-10-01 MED ORDER — OXYCODONE HCL 5 MG PO TABS: 5.0000 mg | ORAL_TABLET | Freq: Three times a day (TID) | ORAL | 0 refills | Status: DC | PRN

## 2017-10-01 MED ORDER — PANCRELIPASE (LIP-PROT-AMYL) 12000-38000 UNITS PO CPEP: 12000.0000 [IU] | ORAL_CAPSULE | Freq: Three times a day (TID) | ORAL | Status: DC

## 2017-10-01 NOTE — Progress Notes (Signed)
Pt discharged to home. DC instructions given with patient's mother at bedside. No concerns voiced. Pt encouraged to stop by pharmacy and pick up medication that was electronically prescribed by MD. Voiced understanding. Left unit in wheelchair pushed by nurse tech accompanied by mother. Left in stable condition. Home medication retrieved from pharmacy and hand-delivered to patient. Hale Bogus.

## 2017-10-01 NOTE — Progress Notes (Signed)
PHARMACIST - PHYSICIAN COMMUNICATION  DR:   Starla Link  CONCERNING: IV to Oral Route Change Policy  RECOMMENDATION: This patient is receiving thiamine and pepcid by the intravenous route.  Based on criteria approved by the Pharmacy and Therapeutics Committee, the intravenous medication(s) is/are being converted to the equivalent oral dose form(s).   DESCRIPTION: These criteria include:  The patient is eating (either orally or via tube) and/or has been taking other orally administered medications for a least 24 hours  The patient has no evidence of active gastrointestinal bleeding or impaired GI absorption (gastrectomy, short bowel, patient on TNA or NPO).  If you have questions about this conversion, please contact the Pharmacy Department  []   [phone] )  [facility] []   [phone] )  [facility] []   [phone] )  [facility] []   [phone] )  [place] [x]   [phone] )  [place], PharmD, BCPS 10/01/2017 11:17 AM

## 2017-10-01 NOTE — Discharge Summary (Signed)
Physician Discharge Summary  [name] FYB:[id number] DOB: 29-May-1970 DOA: 09/28/2017  PCP: Marliss Coots, NP  Admit date: 09/28/2017 Discharge date: 10/01/2017  Admitted From: Home Disposition: Home  Recommendations for Outpatient Follow-up:  1. Follow up with PCP in 1week with repeat CBC/CMP 2. Follow-up with GI as an outpatient 3. Abstain from alcohol/illicit drug use   Home Health: No Equipment/Devices: None Discharge Condition: Stable CODE STATUS: Full Diet recommendation: Heart Healthy   Brief/Interim Summary: 47 year old male with history of chronic parotitis, WPW, polysubstance abuse including cocaine/alcohol/ tetrahydrocannabinol/tobacco presented with complaints of abdominal pain and vomiting after drinking alcohol.  Lipase was elevated at 211.  UDS was positive for cocaine, benzos and barbiturates.  Patient was admitted for pancreatitis and started on IV fluids.  He was initially kept n.p.o. and subsequently was gradually started on clear liquid diet which was advanced to full liquid diet.  His condition has improved.  He has tolerated soft diet today.  No current vomiting.  He will be discharged home with outpatient follow-up with PCP and GI.     Discharge Diagnoses:  Active Problems:   [name] White pattern seen on electrocardiogram   Alcohol-induced acute pancreatitis   Alcohol-induced chronic pancreatitis (HCC)   Major depressive disorder, recurrent severe without psychotic features ([place])   Alcohol use disorder, severe, dependence ([place])   Acute pancreatitis  Acute on chronic pancreatitis -Probably alcohol induced. -Treated with IV fluids, IV analgesics and antiemetics.  Initially was kept n.p.o. and subsequently diet was advanced to clear liquid and further to full liquid.  Today he has tolerated soft diet. -Counseled about abstinence from alcohol - Discharge patient home with outpatient follow-up with primary care provider and GI   -Continue Creon  alcohol dependence and abuse -Treated with CIWA protocol.  Continue multivitamins, thiamine and folate -Outpatient follow-up  Leukocytosis  -Probably reactive.  Resolved  History of WPW syndrome -Outpatient follow-up.  No signs of chest pain or tachycardia  Chronic anemia -Outpatient follow-up with PCP and might need GI evaluation.  Hemoglobin stable for now.  No signs of bleeding.   Hypertension -Monitor blood pressure.  Continue metoprolol and hydralazine  Depression -Continue Zoloft  Polysubstance abuse -Urine drug screen positive for barbiturates, benzodiazepines and cocaine.  Counseled about cessation.  Social worker consulted  Hypomagnesemia -Replaced.    Outpatient follow-up   Discharge Instructions  Discharge Instructions    Ambulatory referral to Gastroenterology   Complete by:  As directed    Recurrent pancreatitis   What is the reason for referral?:  Other   Call MD for:  difficulty breathing, headache or visual disturbances   Complete by:  As directed    Call MD for:  extreme fatigue   Complete by:  As directed    Call MD for:  hives   Complete by:  As directed    Call MD for:  persistant dizziness or light-headedness   Complete by:  As directed    Call MD for:  persistant nausea and vomiting   Complete by:  As directed    Call MD for:  severe uncontrolled pain   Complete by:  As directed    Call MD for:  temperature >100.4   Complete by:  As directed    Diet - low sodium heart healthy   Complete by:  As directed    Increase activity slowly   Complete by:  As directed      Allergies as of 10/01/2017      Reactions  Robaxin [methocarbamol] Other (See Comments)   "jumpy limbs"   Shellfish-derived Products Nausea And Vomiting   Trazodone And Nefazodone Other (See Comments)   Reaction:  Muscle spasms   Contrast Media [iodinated Diagnostic Agents] Hives   Reglan [metoclopramide] Other (See Comments)   Reaction:  Muscle  spasms      Medication List    STOP taking these medications   chlordiazePOXIDE 25 MG capsule Commonly known as:  LIBRIUM     TAKE these medications   albuterol 108 (90 Base) MCG/ACT inhaler Commonly known as:  PROVENTIL HFA;VENTOLIN HFA Inhale 2 puffs into the lungs every 6 (six) hours as needed for wheezing or shortness of breath.   folic acid 1 MG tablet Commonly known as:  FOLVITE Take 1 tablet (1 mg total) by mouth daily.   hydrALAZINE 25 MG tablet Commonly known as:  APRESOLINE Take 1 tablet (25 mg total) by mouth every 8 (eight) hours. What changed:  when to take this   lipase/protease/amylase 12000 units Cpep capsule Commonly known as:  CREON Take 1 capsule (12,000 Units total) by mouth 3 (three) times daily with meals. For pancreatitis What changed:  how much to take   loratadine 10 MG tablet Commonly known as:  CLARITIN Take 1 tablet (10 mg total) by mouth daily as needed for allergies or rhinitis. (May purchase from over the counter): For allergies What changed:    when to take this  reasons to take this   metoprolol tartrate 25 MG tablet Commonly known as:  LOPRESSOR Take 1 tablet (25 mg total) by mouth 2 (two) times daily. For high blood pressure   multivitamin with minerals Tabs tablet Take 1 tablet by mouth daily.   ondansetron 4 MG tablet Commonly known as:  ZOFRAN Take 4 mg by mouth 2 (two) times daily as needed for nausea/vomiting.   oxyCODONE 5 MG immediate release tablet Commonly known as:  ROXICODONE Take 1 tablet (5 mg total) by mouth every 8 (eight) hours as needed for up to 14 doses for severe pain.   pantoprazole 40 MG tablet Commonly known as:  PROTONIX Take 1 tablet (40 mg total) by mouth daily.   sertraline 100 MG tablet Commonly known as:  ZOLOFT Take 1 tablet (100 mg total) by mouth daily.   sucralfate 1 g tablet Commonly known as:  CARAFATE Take 1 tablet by mouth 4 (four) times daily. What changed:  Another medication with  the same name was removed. Continue taking this medication, and follow the directions you see here.   thiamine 100 MG tablet Take 1 tablet (100 mg total) by mouth daily.      Follow-up Information    [name], [name], NP Follow up.   Contact information: [place] [address] [phone]        [place]. Call.   Why:  make a hosital follow up appointment to get a pcp and for help with the carafate Contact information: [place] [id number] [phone]         Allergies  Allergen Reactions  . Robaxin [Methocarbamol] Other (See Comments)    "jumpy limbs"  . Shellfish-Derived Products Nausea And Vomiting  . Trazodone And Nefazodone Other (See Comments)    Reaction:  Muscle spasms  . Contrast Media [Iodinated Diagnostic Agents] Hives  . Reglan [Metoclopramide] Other (See Comments)    Reaction:  Muscle spasms    Consultations:  None   Procedures/Studies: Dg  Chest 2 View  Result Date: 09/04/2017 CLINICAL DATA:  Mid chest pain, epigastric pain EXAM: CHEST - 2 VIEW COMPARISON:  07/05/2017 FINDINGS: Heart and mediastinal contours are within normal limits. No focal opacities or effusions. No acute bony abnormality. IMPRESSION: No active cardiopulmonary disease. Electronically Signed   By: Rolm Baptise M.D.   On: 09/04/2017 12:31     Subjective: Patient seen and examined at bedside.  He still complains of intermittent abdominal pain but is tolerating diet and no overnight vomiting.  No overnight fever.  Discharge Exam: Vitals:   10/01/17 0448 10/01/17 1145  BP: 114/83 109/71  Pulse: 75 66  Resp: 18 18  Temp: 98 F (36.7 C) 98.7 F (37.1 C)  SpO2: 100% 97%   Vitals:   09/30/17 2100 10/01/17 0000 10/01/17 0448 10/01/17 1145  BP: (!) 164/101 130/88 114/83 109/71  Pulse: 81 63 75 66  Resp: 18 19 18 18   Temp: 98.2 F (36.8 C) 98.1 F (36.7 C) 98 F (36.7 C) 98.7 F (37.1  C)  TempSrc: Oral Oral Oral Oral  SpO2: 100% 100% 100% 97%  Weight:      Height:        General: Pt is alert, awake, not in acute distress Cardiovascular: Rate controlled, S1/S2 + Respiratory: Bilateral decreased breath sounds at bases Abdominal: Soft, mild epigastric tenderness, ND, bowel sounds + Extremities: no edema, no cyanosis    The results of significant diagnostics from this hospitalization (including imaging, microbiology, ancillary and laboratory) are listed below for reference.     Microbiology: No results found for this or any previous visit (from the past 240 hour(s)).   Labs: BNP (last 3 results) No results for input(s): BNP in the last 8760 hours. Basic Metabolic Panel: Recent Labs  Lab 09/25/17 1944 09/28/17 1742 09/28/17 2158 09/29/17 0557 09/30/17 0537 10/01/17 0552  NA 139 142  --   --  139 136  K 4.1 3.8  --   --  4.3 4.8  CL 104 111  --   --  105 102  CO2 26 22  --   --  23 23  GLUCOSE 149* 103*  --   --  110* 90  BUN 8 10  --   --  5* <5*  CREATININE 0.86 1.00  --   --  0.78 0.75  CALCIUM 9.1 9.0  --   --  8.9 9.4  MG  --   --  1.6* 2.2 1.5* 1.5*   Liver Function Tests: Recent Labs  Lab 09/25/17 1944 09/28/17 1742 09/30/17 0537 10/01/17 0552  AST 46* 31 24 30   ALT 35 28 22 24   ALKPHOS 64 67 67 78  BILITOT 0.2* 0.5 0.6 0.7  PROT 7.7 7.5 7.1 8.2*  ALBUMIN 3.9 3.6 3.6 4.0   Recent Labs  Lab 09/25/17 1944 09/28/17 1742  LIPASE 108* 211*   No results for input(s): AMMONIA in the last 168 hours. CBC: Recent Labs  Lab 09/25/17 1944 09/28/17 1742 09/29/17 0557 09/30/17 0537 10/01/17 0552  WBC 9.9 10.8* 11.1* 10.2 8.8  NEUTROABS  --  4.6  --  4.5 3.8  HGB 8.3* 7.9* 7.9* 7.6* 8.3*  HCT 25.1* 25.0* 25.5* 24.7* 27.5*  MCV 75.6* 76.9* 77.5* 77.9* 78.3  PLT 317 236 207 185 169   Cardiac Enzymes: No results for input(s): CKTOTAL, CKMB, CKMBINDEX, TROPONINI in the last 168 hours. BNP: Invalid input(s): POCBNP CBG: No results  for input(s): GLUCAP in the last 168 hours. D-Dimer No  results for input(s): DDIMER in the last 72 hours. Hgb A1c No results for input(s): HGBA1C in the last 72 hours. Lipid Profile No results for input(s): CHOL, HDL, LDLCALC, TRIG, CHOLHDL, LDLDIRECT in the last 72 hours. Thyroid function studies No results for input(s): TSH, T4TOTAL, T3FREE, THYROIDAB in the last 72 hours.  Invalid input(s): FREET3 Anemia work up Recent Labs    09/29/17 0557  VITAMINB12 253  FOLATE 11.0  FERRITIN 13*  TIBC 525*  IRON 36*  RETICCTPCT 1.3   Urinalysis    Component Value Date/Time   COLORURINE YELLOW 09/28/2017 [place] 09/28/2017 1722   LABSPEC 1.012 09/28/2017 1722   PHURINE 5.0 09/28/2017 1722   GLUCOSEU NEGATIVE 09/28/2017 1722   HGBUR NEGATIVE 09/28/2017 1722   HGBUR negative 04/30/2010 1020   BILIRUBINUR NEGATIVE 09/28/2017 1722   KETONESUR NEGATIVE 09/28/2017 1722   PROTEINUR NEGATIVE 09/28/2017 1722   UROBILINOGEN 0.2 03/15/2015 0508   NITRITE NEGATIVE 09/28/2017 1722   LEUKOCYTESUR NEGATIVE 09/28/2017 1722   Sepsis Labs Invalid input(s): PROCALCITONIN,  WBC,  LACTICIDVEN Microbiology No results found for this or any previous visit (from the past 240 hour(s)).   Time coordinating discharge: 35 minutes  SIGNED:   Aline August, MD  [facility] 10/01/2017, 12:50 PM Pager: [phone]  If 7PM-7AM, please contact night-coverage [email or web address] Password [email or web address]

## 2017-10-06 ENCOUNTER — Encounter (HOSPITAL_COMMUNITY): Payer: Self-pay | Admitting: Emergency Medicine

## 2017-10-06 ENCOUNTER — Emergency Department (HOSPITAL_COMMUNITY)
Admission: EM | Admit: 2017-10-06 | Discharge: 2017-10-06 | Disposition: A | Discharge status: Home / Self Care | Payer: Self-pay | Attending: Emergency Medicine | Admitting: Emergency Medicine

## 2017-10-06 DIAGNOSIS — J45909 Unspecified asthma, uncomplicated: Secondary | ICD-10-CM | POA: Insufficient documentation

## 2017-10-06 DIAGNOSIS — Z79899 Other long term (current) drug therapy: Secondary | ICD-10-CM | POA: Insufficient documentation

## 2017-10-06 DIAGNOSIS — R1084 Generalized abdominal pain: Secondary | ICD-10-CM | POA: Insufficient documentation

## 2017-10-06 DIAGNOSIS — F17210 Nicotine dependence, cigarettes, uncomplicated: Secondary | ICD-10-CM | POA: Insufficient documentation

## 2017-10-06 DIAGNOSIS — F1010 Alcohol abuse, uncomplicated: Secondary | ICD-10-CM | POA: Insufficient documentation

## 2017-10-06 DIAGNOSIS — I10 Essential (primary) hypertension: Secondary | ICD-10-CM | POA: Insufficient documentation

## 2017-10-06 DIAGNOSIS — G8929 Other chronic pain: Secondary | ICD-10-CM | POA: Insufficient documentation

## 2017-10-06 NOTE — ED Notes (Signed)
I have informed pt that if he continues to drink he will cause permanent damage to organs and possibly die. I have informed pt that the provider has evaluated pt and that he can not continue to use the ED as a primary care office due to the fact that pt has been seen in the ED almost every time for ETOH based issue. I informed pt that there are numbers on the back of discharge paperwork that pt can call in order to get help. Pt will be discharged to lobby and will wait there to speak to Nursing Supervisor.

## 2017-10-06 NOTE — ED Provider Notes (Signed)
[facility] DEPT Provider Note   CSN: [id number] Arrival date & time: 10/06/17  1038     History   Chief Complaint Chief Complaint  Patient presents with  . Abdominal Pain    HPI Jason Dawn Kiper is a 47 y.o. male.  Chief complaint is abdominal pain  HPI: 47 year old male.  Frequent visitor in the emergency room for abdominal pain.  Chronic pancreatitis.  Discharged on Monday from the hospital.  He states that he had 2 beers 2 nights ago and has had 2 beers each night for the last 2 nights "not very much".  He had pizza and fried chicken last night.  He states that after he drank the alcohol his abdomen hurting.  He is not vomiting.  His pain is slightly better this morning but states "I am out of my pain medication".  Past Medical History:  Diagnosis Date  . Alcoholism /alcohol abuse ([place])   . Anemia   . Anxiety   . Arthritis    "knees; arms; elbows" (03/26/2015)  . Asthma   . Bipolar disorder ([place])   . Chronic bronchitis ([place])   . Chronic lower back pain   . Chronic pancreatitis ([place])   . Cocaine abuse ([place])   . Depression   . Family history of adverse reaction to anesthesia    "grandmother gets confused"  . Femoral condyle fracture ([place]) 03/08/2014   left medial/notes 03/09/2014  . GERD (gastroesophageal reflux disease)   . H/O hiatal hernia   . H/O suicide attempt 10/2012  . Heart murmur    "when he was little" (03/06/2013)  . High cholesterol   . History of blood transfusion 10/2012   "when I tried to commit suicide"  . History of stomach ulcers   . Hypertension   . Marijuana abuse, continuous   . Migraine    "a few times/year" (03/26/2015)  . Pneumonia 1990's X 3  . PTSD (post-traumatic stress disorder)   . Shortness of breath    "can happen at anytime" (03/06/2013)  . Sickle cell trait ([place])   . WPW (Wolff-Parkinson-White syndrome)    [name] 03/06/2013    Patient Active Problem List   Diagnosis Date Noted  .  Acute pancreatitis 09/28/2017  . Abdominal pain 05/27/2017  . Hematemesis 05/27/2017  . Tachycardia 03/18/2017  . Diarrhea 03/18/2017  . Acute on chronic pancreatitis ([place]) 12/17/2016  . Intractable nausea and vomiting 12/05/2016  . Verbally abusive behavior 12/05/2016  . Normocytic anemia 12/05/2016  . Alcohol use disorder, severe, dependence ([place]) 07/25/2016  . Cocaine use disorder, severe, dependence ([place]) 07/25/2016  . Major depressive disorder, recurrent severe without psychotic features ([place]) 07/20/2016  . Leukocytosis   . Hospital acquired PNA 05/20/2015  . Pseudocyst of pancreas 05/18/2015  . Polysubstance abuse (tobacco, cocaine, THC, and ETOH) 03/26/2015  . Alcohol-induced chronic pancreatitis ([place])   . Benign essential HTN 02/06/2014  . Alcohol-induced acute pancreatitis 11/28/2013  . Pancreatic pseudocyst/cyst 11/25/2013  . Severe protein-calorie malnutrition ([place]) 10/10/2013  . Suicide attempt ([place]) 10/08/2013  . 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 TOBACCO ABUSE 03/23/2007    Past Surgical History:  Procedure Laterality Date  . CARDIAC CATHETERIZATION    . EYE SURGERY Left 1990's   "result of trauma"   . FACIAL FRACTURE SURGERY Left 1990's   "result of trauma"   . FRACTURE SURGERY    . HERNIA REPAIR    . LEFT HEART CATHETERIZATION WITH CORONARY ANGIOGRAM Right 03/07/2013  Procedure: LEFT HEART CATHETERIZATION WITH CORONARY ANGIOGRAM;  Surgeon: [name], MD;  Location: [facility] CATH LAB;  Service: Cardiovascular;  Laterality: Right;  . UMBILICAL HERNIA REPAIR          Home Medications    Prior to Admission medications   Medication Sig Start Date End Date Taking? Authorizing Provider  albuterol (PROVENTIL HFA;VENTOLIN HFA) 108 (90 Base) MCG/ACT inhaler Inhale 2 puffs into the lungs every 6 (six) hours as needed for wheezing or shortness of breath. 08/29/17  Yes Clent Demark, PA-C  folic acid (FOLVITE) 1 MG tablet Take  1 tablet (1 mg total) by mouth daily. 09/06/17  Yes Dana Allan I, MD  hydrALAZINE (APRESOLINE) 25 MG tablet Take 1 tablet (25 mg total) by mouth every 8 (eight) hours. Patient taking differently: Take 25 mg by mouth at bedtime.  08/29/17  Yes Clent Demark, PA-C  lipase/protease/amylase (CREON) 12000 units CPEP capsule Take 1 capsule (12,000 Units total) by mouth 3 (three) times daily with meals. For pancreatitis 10/01/17  Yes Aline August, MD  loratadine (CLARITIN) 10 MG tablet Take 1 tablet (10 mg total) by mouth daily as needed for allergies or rhinitis. (May purchase from over the counter): For allergies 10/01/17  Yes Aline August, MD  metoprolol tartrate (LOPRESSOR) 25 MG tablet Take 1 tablet (25 mg total) by mouth 2 (two) times daily. For high blood pressure 08/29/17  Yes Clent Demark, PA-C  Multiple Vitamin (MULTIVITAMIN WITH MINERALS) TABS tablet Take 1 tablet by mouth daily. 09/06/17  Yes Bonnell Public, MD  ondansetron (ZOFRAN) 4 MG tablet Take 4 mg by mouth 2 (two) times daily as needed for nausea/vomiting. 09/19/17  Yes [provider]  oxyCODONE (ROXICODONE) 5 MG immediate release tablet Take 1 tablet (5 mg total) by mouth every 8 (eight) hours as needed for up to 14 doses for severe pain. 10/01/17  Yes Aline August, MD  pantoprazole (PROTONIX) 40 MG tablet Take 1 tablet (40 mg total) by mouth daily. 08/29/17  Yes Clent Demark, PA-C  sucralfate (CARAFATE) 1 g tablet Take 1 tablet by mouth 4 (four) times daily. 09/19/17  Yes [provider]  sertraline (ZOLOFT) 100 MG tablet Take 1 tablet (100 mg total) by mouth daily. Patient not taking: Reported on 09/15/2017 08/29/17   Clent Demark, PA-C  thiamine 100 MG tablet Take 1 tablet (100 mg total) by mouth daily. Patient not taking: Reported on 09/28/2017 08/29/17   Clent Demark, PA-C  omeprazole (PRILOSEC) 20 MG capsule Take 1 capsule (20 mg total) daily by mouth. 03/22/17 06/21/17  Varney Biles, MD    Family History Family History  Problem Relation Age of Onset  . Hypertension Mother   . Cirrhosis Mother   . Alcoholism Mother   . Hypertension Father   . Melanoma Father   . Hypertension Other   . Coronary artery disease Other     Social History Social History   Tobacco Use  . Smoking status: Current Every Day Smoker    Packs/day: 1.00    Years: 33.00    Pack years: 33.00    Types: Cigarettes, E-cigarettes  . Smokeless tobacco: Never Used  Substance Use Topics  . Alcohol use: Yes  . Drug use: Yes    Types: Marijuana, Cocaine    Comment: daily marijuana use; last cocaine use about 3 months ago     Allergies   Robaxin [methocarbamol]; Shellfish-derived products; Trazodone and nefazodone; Contrast media [iodinated diagnostic agents]; and  Reglan [metoclopramide]   Review of Systems Review of Systems  Constitutional: Negative for appetite change, chills, diaphoresis, fatigue and fever.  HENT: Negative for mouth sores, sore throat and trouble swallowing.   Eyes: Negative for visual disturbance.  Respiratory: Negative for cough, chest tightness, shortness of breath and wheezing.   Cardiovascular: Negative for chest pain.  Gastrointestinal: Positive for abdominal pain. Negative for abdominal distention, diarrhea, nausea and vomiting.  Endocrine: Negative for polydipsia, polyphagia and polyuria.  Genitourinary: Negative for dysuria, frequency and hematuria.  Musculoskeletal: Negative for gait problem.  Skin: Negative for color change, pallor and rash.  Neurological: Negative for dizziness, syncope, light-headedness and headaches.  Hematological: Does not bruise/bleed easily.  Psychiatric/Behavioral: Negative for behavioral problems and confusion.     Physical Exam Updated Vital Signs BP (!) 141/95 (BP Location: Left Arm)   Pulse 93   Temp 98 F (36.7 C) (Oral)   Resp 16   SpO2 100%   Physical Exam  Constitutional: He is oriented to person, place,  and time. He appears well-developed and well-nourished. No distress.  HENT:  Head: Normocephalic.  Eyes: Pupils are equal, round, and reactive to light. Conjunctivae are normal. No scleral icterus.  No scleral icterus.  Conjunctive a not pale.  Neck: Normal range of motion. Neck supple. No thyromegaly present.  Cardiovascular: Normal rate and regular rhythm. Exam reveals no gallop and no friction rub.  No murmur heard. Pulmonary/Chest: Effort normal and breath sounds normal. No respiratory distress. He has no wheezes. He has no rales.  Abdominal: Soft. Bowel sounds are normal. He exhibits no distension. There is no tenderness. There is no rebound.  He has some tenderness but no guarding or rebound in the upper abdomen.  He is drinking water in the room.  Musculoskeletal: Normal range of motion.  Neurological: He is alert and oriented to person, place, and time.  Skin: Skin is warm and dry. No rash noted.  Psychiatric: He has a normal mood and affect. His behavior is normal.     ED Treatments / Results  Labs (all labs ordered are listed, but only abnormal results are displayed) Labs Reviewed - No data to display  EKG None  Radiology No results found.  Procedures Procedures (including critical care time)  Medications Ordered in ED Medications - No data to display   Initial Impression / Assessment and Plan / ED Course  I have reviewed the triage vital signs and the nursing notes.  Pertinent labs & imaging results that were available during my care of the patient were reviewed by me and considered in my medical decision making (see chart for details).    Tonic pain.  Alcohol abuse.  I had a frank discussion with him that I simply would not continue to provide narcotics that would allow him to consume alcohol when he admits to me that he realizes any alcohol will hurt his abdomen.  He does not withdraw.  He does not have shakes or tremors.  He states he drinks because "I am used  to it".  Again, frank discussion.  He was not happy with my answers.  However I felt that it would be an appropriate to continue to prescribe controlled substances.  Final Clinical Impressions(s) / ED Diagnoses   Final diagnoses:  Other chronic pain  Alcohol abuse    ED Discharge Orders    None       Tanna Furry, MD 10/06/17 1141

## 2017-10-06 NOTE — Discharge Instructions (Addendum)
Absolutely stop drinking any alcohol whatsoever. Your pain will slowly improve over the next several days.

## 2017-10-06 NOTE — ED Notes (Signed)
Spoke with ED Provider about pt. Pt is requesting to speak to Supervisor and is requesting a bus ticket.

## 2017-10-06 NOTE — ED Triage Notes (Signed)
Pt from home by EMS, c/o abdominal pain. Pt states he was d/c from [facility] with oxycodone and the pain meds are not helping his pain. Pt reports he ate pizza, fried chicken and several beers last evening.

## 2017-10-10 ENCOUNTER — Encounter: Payer: Self-pay | Admitting: Physician Assistant

## 2017-10-14 IMAGING — CT CT ABD-PELV W/ CM
2 of 5 series · 11 of 46 positions shown, 12 images · IV contrast (Iodine)
Comparison: 03/26/2015

CLINICAL DATA: Abdominal pain just above the umbilicus. Vomiting.
History pancreatitis. Cocaine abuse.

EXAM:
CT ABDOMEN AND PELVIS WITH CONTRAST
TECHNIQUE: Multidetector CT imaging of the abdomen and pelvis was performed
using the standard protocol following bolus administration of
intravenous contrast.
CONTRAST:  100mL OMNIPAQUE IOHEXOL 300 MG/ML  SOLN

[Series 201: routine, idose (2) · axial · 0.78mm/px · z∈[-578,-263]mm · 8 of 83 slices shown, 9 images]
[im 10/83  soft-tissue]
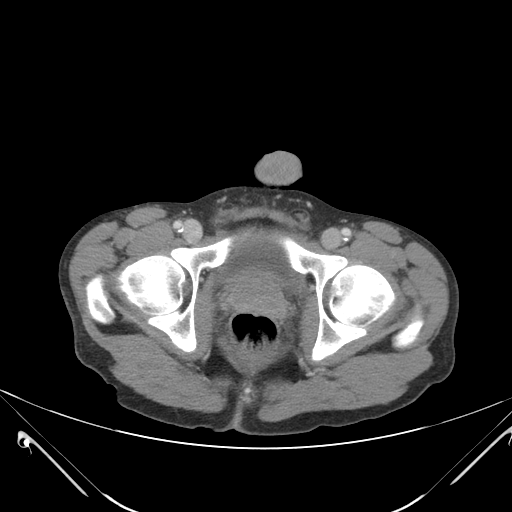
[im 10/83  bone]
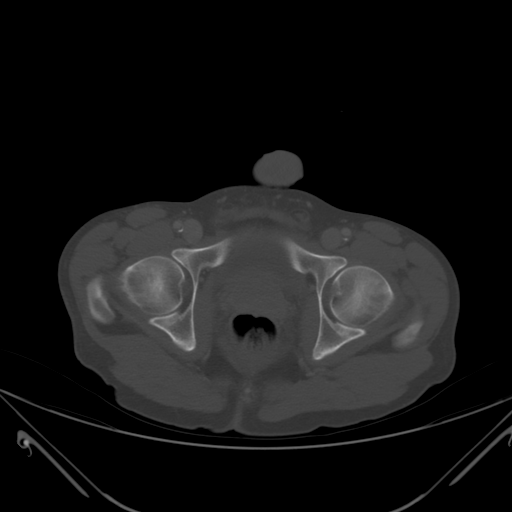
[im 19/83  soft-tissue]
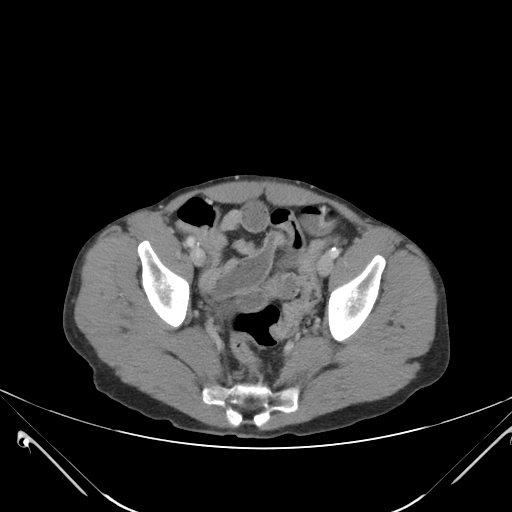
[im 28/83  soft-tissue]
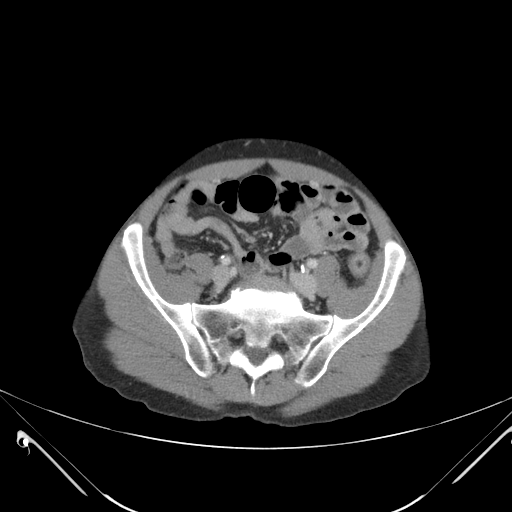
[im 37/83  soft-tissue]
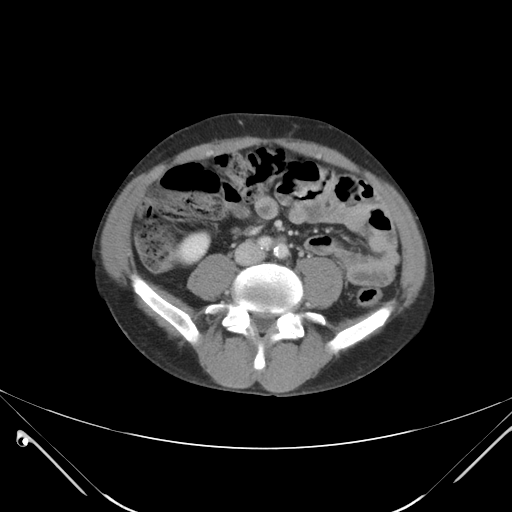
[im 46/83  soft-tissue]
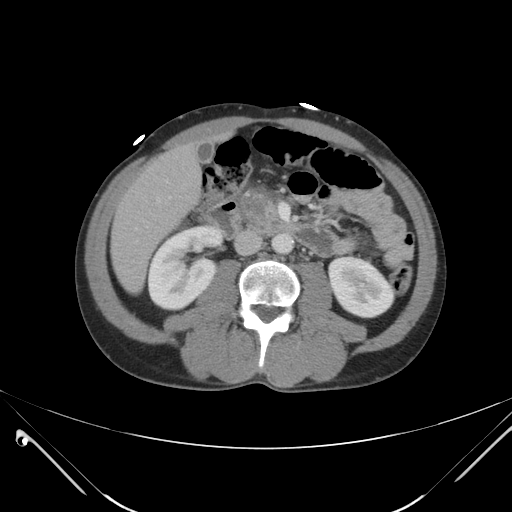
[im 55/83  soft-tissue]
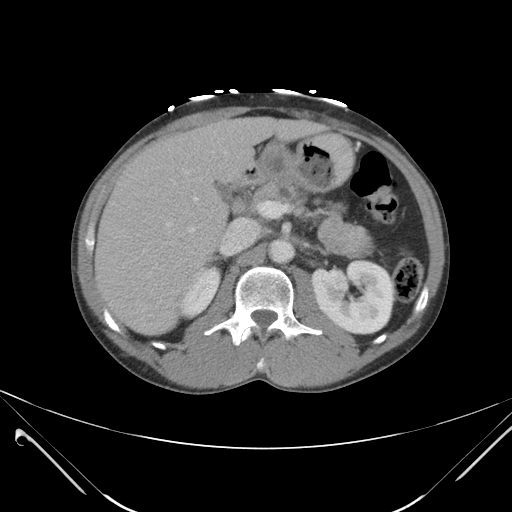
[im 64/83  soft-tissue]
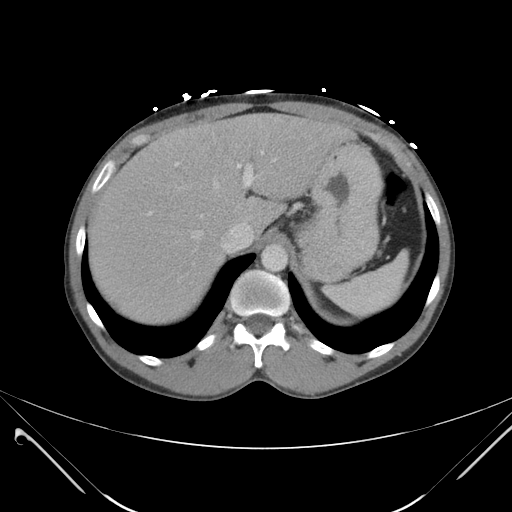
[im 73/83  soft-tissue]
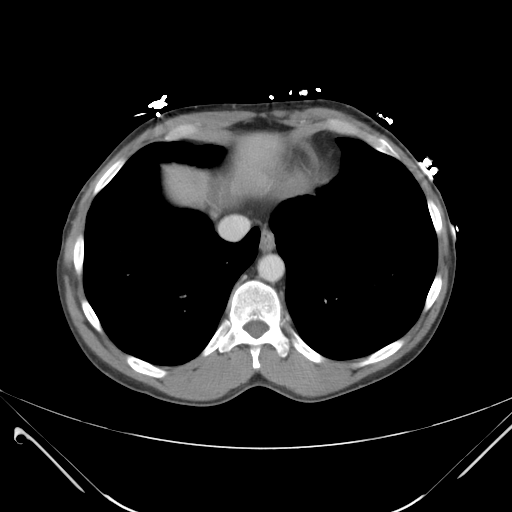

[Series 203: coronals, idose (2) · coronal · 0.45mm/px · 3 of 97 slices shown]
[im 33/97  soft-tissue]
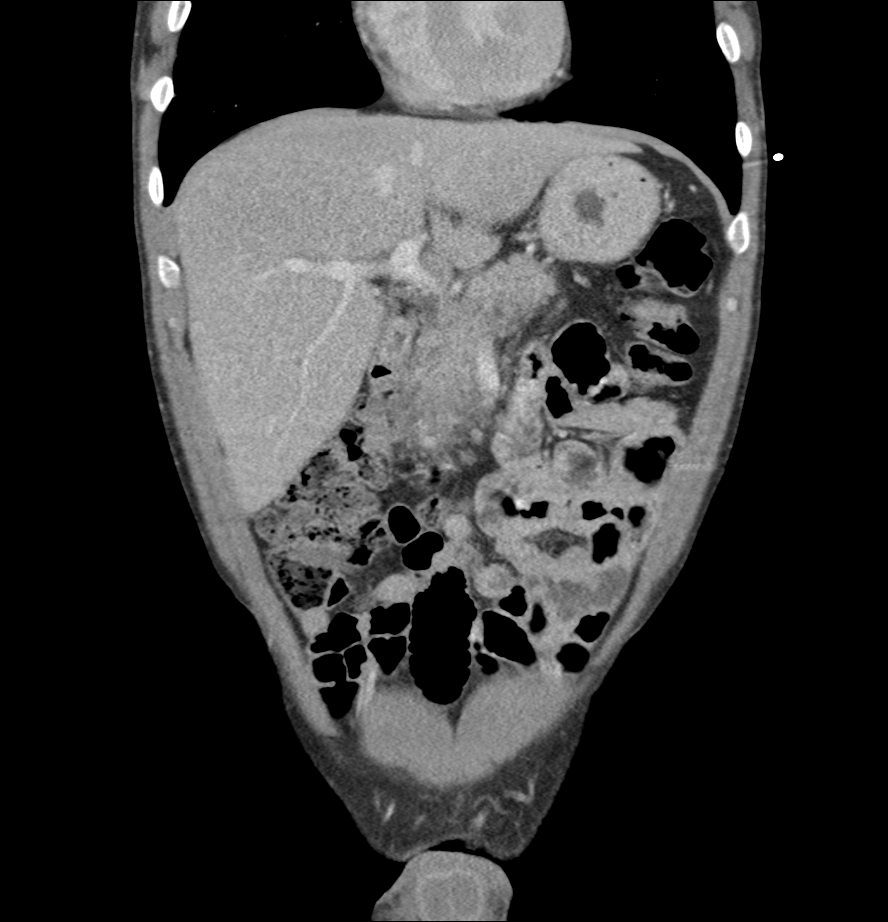
[im 43/97  soft-tissue]
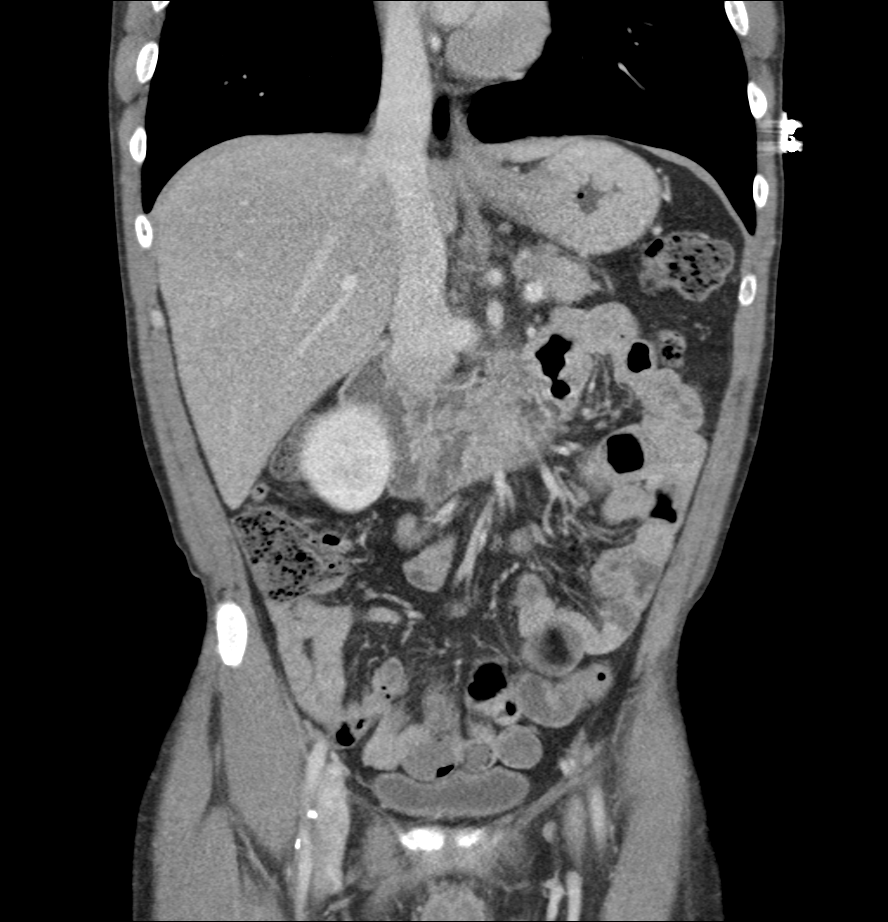
[im 54/97  soft-tissue]
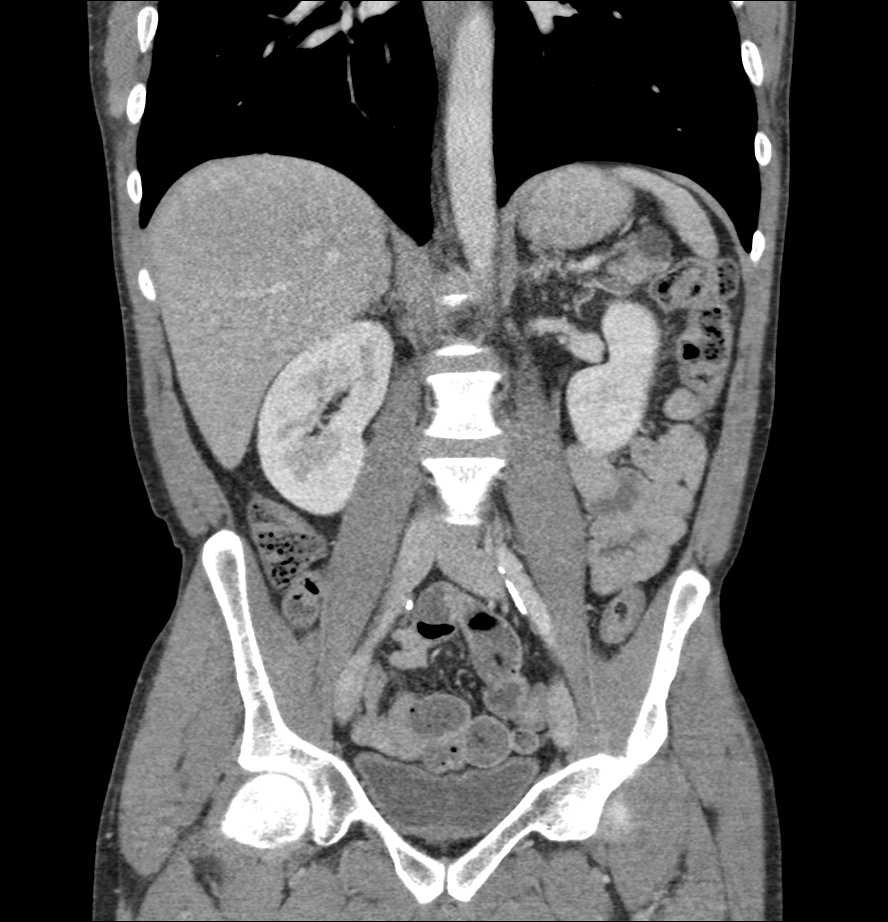

[11 of 46 positions shown; findings below may reference images not displayed]

FINDINGS: Lower chest: Clear lung bases. Normal heart size without pericardial
or pleural effusion.

Hepatobiliary: Normal liver. Normal gallbladder, without biliary
ductal dilatation.

Pancreas: Cystic lesion within the pancreatic head/ uncinate process
measures 1.7 cm on image 36/ series 201. This measures 1.3 cm on the
prior exam.

Decreased size of a cystic lesion the pancreatic neck, which
measures only 9 mm today. pancreatic tail cystic lesion measures
cm today and is similar. No residual pancreatitis identified. No
duct dilatation.

Spleen: Normal in size, without focal abnormality.

Adrenals/Urinary Tract: Normal adrenal glands. Normal kidneys,
without hydronephrosis. Normal urinary bladder.

Stomach/Bowel: Gastric wall thickening, with the greater curvature
measuring 2.6 cm. This is similar to on the prior. Normal colon and
terminal ileum. Normal appendix.

Vascular/Lymphatic: Aortic and branch vessel atherosclerosis. No
abdominopelvic adenopathy.

Reproductive: Normal prostate.

Other: No significant free fluid.

Musculoskeletal: No acute osseous abnormality.
IMPRESSION: 1. Re- demonstration of pancreatic pseudocysts. These are varying in
size, as detailed above. No evidence of residual acute pancreatitis.
2. Persistent gastric wall thickening, suspicious for gastritis.
3. Age advanced atherosclerosis.

## 2017-10-19 ENCOUNTER — Encounter (HOSPITAL_COMMUNITY): Payer: Self-pay | Admitting: Emergency Medicine

## 2017-10-19 ENCOUNTER — Emergency Department (HOSPITAL_COMMUNITY)
Admission: EM | Admit: 2017-10-19 | Discharge: 2017-10-19 | Disposition: A | Discharge status: Home / Self Care | Payer: Self-pay | Attending: Emergency Medicine | Admitting: Emergency Medicine

## 2017-10-19 ENCOUNTER — Emergency Department (HOSPITAL_COMMUNITY): Payer: Self-pay

## 2017-10-19 DIAGNOSIS — Z79899 Other long term (current) drug therapy: Secondary | ICD-10-CM | POA: Insufficient documentation

## 2017-10-19 DIAGNOSIS — K2920 Alcoholic gastritis without bleeding: Secondary | ICD-10-CM

## 2017-10-19 DIAGNOSIS — F17210 Nicotine dependence, cigarettes, uncomplicated: Secondary | ICD-10-CM | POA: Insufficient documentation

## 2017-10-19 DIAGNOSIS — J45909 Unspecified asthma, uncomplicated: Secondary | ICD-10-CM | POA: Insufficient documentation

## 2017-10-19 LAB — URINALYSIS, ROUTINE W REFLEX MICROSCOPIC
BILIRUBIN URINE: NEGATIVE
Bacteria, UA: NONE SEEN
Glucose, UA: NEGATIVE mg/dL
Hgb urine dipstick: NEGATIVE
KETONES UR: NEGATIVE mg/dL
Leukocytes, UA: NEGATIVE
Nitrite: NEGATIVE
Protein, ur: 30 mg/dL — AB
Specific Gravity, Urine: 1.02 (ref 1.005–1.030)
pH: 5 (ref 5.0–8.0)

## 2017-10-19 LAB — RAPID URINE DRUG SCREEN, HOSP PERFORMED
Amphetamines: NOT DETECTED
Benzodiazepines: POSITIVE — AB
Cocaine: POSITIVE — AB
Opiates: NOT DETECTED
Tetrahydrocannabinol: POSITIVE — AB

## 2017-10-19 LAB — COMPREHENSIVE METABOLIC PANEL
ALT: 24 U/L (ref 17–63)
ANION GAP: 10 (ref 5–15)
AST: 36 U/L (ref 15–41)
Albumin: 3.9 g/dL (ref 3.5–5.0)
Alkaline Phosphatase: 88 U/L (ref 38–126)
BUN: 8 mg/dL (ref 6–20)
CHLORIDE: 107 mmol/L (ref 101–111)
CO2: 22 mmol/L (ref 22–32)
Calcium: 9.4 mg/dL (ref 8.9–10.3)
Creatinine, Ser: 0.96 mg/dL (ref 0.61–1.24)
GFR calc non Af Amer: >60 mL/min (ref >60)
Glucose, Bld: 120 mg/dL — ABNORMAL HIGH (ref 65–99)
Potassium: 4 mmol/L (ref 3.5–5.1)
SODIUM: 139 mmol/L (ref 135–145)
Total Bilirubin: 0.9 mg/dL (ref 0.3–1.2)
Total Protein: 8 g/dL (ref 6.5–8.1)

## 2017-10-19 LAB — CBC
HCT: 30 % — ABNORMAL LOW (ref 39.0–52.0)
HEMOGLOBIN: 9.4 g/dL — AB (ref 13.0–17.0)
MCH: 23.4 pg — AB (ref 26.0–34.0)
MCHC: 31.3 g/dL (ref 30.0–36.0)
MCV: 74.6 fL — AB (ref 78.0–100.0)
Platelets: 219 10*3/uL (ref 150–400)
RBC: 4.02 MIL/uL — AB (ref 4.22–5.81)
RDW: 19.1 % — ABNORMAL HIGH (ref 11.5–15.5)
WBC: 12.2 10*3/uL — ABNORMAL HIGH (ref 4.0–10.5)

## 2017-10-19 LAB — I-STAT TROPONIN, ED: TROPONIN I, POC: 0 ng/mL (ref 0.00–0.08)

## 2017-10-19 LAB — LIPASE, BLOOD: LIPASE: 118 U/L — AB (ref 11–51)

## 2017-10-19 LAB — ETHANOL: Alcohol, Ethyl (B): <10 mg/dL (ref <10)

## 2017-10-19 MED ORDER — SODIUM CHLORIDE 0.9 % IV SOLN: INTRAVENOUS | Status: DC

## 2017-10-19 MED ORDER — GI COCKTAIL ~~LOC~~
30.0000 mL | Freq: Once | ORAL | Status: AC
  Administered 2017-10-19: 30 mL via ORAL
  Filled 2017-10-19: qty 30

## 2017-10-19 MED ORDER — LORAZEPAM 2 MG/ML IJ SOLN
1.0000 mg | Freq: Once | INTRAMUSCULAR | Status: AC
  Administered 2017-10-19: 1 mg via INTRAVENOUS
  Filled 2017-10-19: qty 1

## 2017-10-19 MED ORDER — SUCRALFATE 1 G PO TABS
1.0000 g | ORAL_TABLET | Freq: Once | ORAL | Status: AC
  Administered 2017-10-19: 1 g via ORAL
  Filled 2017-10-19: qty 1

## 2017-10-19 MED ORDER — ONDANSETRON HCL 4 MG/2ML IJ SOLN
4.0000 mg | Freq: Once | INTRAMUSCULAR | Status: AC
  Administered 2017-10-19: 4 mg via INTRAVENOUS
  Filled 2017-10-19: qty 2

## 2017-10-19 MED ORDER — KETOROLAC TROMETHAMINE 30 MG/ML IJ SOLN
15.0000 mg | Freq: Once | INTRAMUSCULAR | Status: AC
  Administered 2017-10-19: 15 mg via INTRAVENOUS
  Filled 2017-10-19: qty 1

## 2017-10-19 MED ORDER — FAMOTIDINE 20 MG PO TABS
40.0000 mg | ORAL_TABLET | Freq: Once | ORAL | Status: AC
  Administered 2017-10-19: 40 mg via ORAL
  Filled 2017-10-19: qty 2

## 2017-10-19 MED ORDER — SODIUM CHLORIDE 0.9 % IV BOLUS
2000.0000 mL | Freq: Once | INTRAVENOUS | Status: AC
  Administered 2017-10-19: 2000 mL via INTRAVENOUS

## 2017-10-19 MED ORDER — FENTANYL CITRATE (PF) 100 MCG/2ML IJ SOLN
50.0000 ug | Freq: Once | INTRAMUSCULAR | Status: AC
  Administered 2017-10-19: 50 ug via INTRAVENOUS
  Filled 2017-10-19: qty 2

## 2017-10-19 MED ORDER — HALOPERIDOL LACTATE 5 MG/ML IJ SOLN
1.0000 mg | Freq: Once | INTRAMUSCULAR | Status: AC
  Administered 2017-10-19: 1 mg via INTRAVENOUS
  Filled 2017-10-19: qty 1

## 2017-10-19 NOTE — ED Provider Notes (Signed)
[facility] EMERGENCY DEPARTMENT Provider Note   CSN: [id number] Arrival date & time: 10/19/17  1853     History   Chief Complaint Chief Complaint  Patient presents with  . Abdominal Pain  . Emesis  . Chest Pain    HPI [name] is a 47 y.o. male.  47 year old male with history of chronic pancreatitis secondary to alcohol abuse presents with worsening epigastric abdominal pain which began after he drank 2-3 beers yesterday.  He has had nonbilious emesis.  Denies any black or bloody stools.  No fever chills.  Denies any anginal type chest pain.  Symptoms are similar to his prior recent patients and patient is well-known to the system.  No treatment used prior to arrival.     Past Medical History:  Diagnosis Date  . Alcoholism /alcohol abuse ([place])   . Anemia   . Anxiety   . Arthritis    "knees; arms; elbows" (03/26/2015)  . Asthma   . Bipolar disorder ([place])   . Chronic bronchitis ([place])   . Chronic lower back pain   . Chronic pancreatitis ([place])   . Cocaine abuse ([place])   . Depression   . Family history of adverse reaction to anesthesia    "grandmother gets confused"  . Femoral condyle fracture ([place]) 03/08/2014   left medial/notes 03/09/2014  . GERD (gastroesophageal reflux disease)   . H/O hiatal hernia   . H/O suicide attempt 10/2012  . Heart murmur    "when he was little" (03/06/2013)  . High cholesterol   . History of blood transfusion 10/2012   "when I tried to commit suicide"  . History of stomach ulcers   . Hypertension   . Marijuana abuse, continuous   . Migraine    "a few times/year" (03/26/2015)  . Pneumonia 1990's X 3  . PTSD (post-traumatic stress disorder)   . Shortness of breath    "can happen at anytime" (03/06/2013)  . Sickle cell trait ([place])   . WPW (Wolff-Parkinson-White syndrome)    [name] 03/06/2013    Patient Active Problem List   Diagnosis Date Noted  . Acute pancreatitis 09/28/2017  . Abdominal pain  05/27/2017  . Hematemesis 05/27/2017  . Tachycardia 03/18/2017  . Diarrhea 03/18/2017  . Acute on chronic pancreatitis ([place]) 12/17/2016  . Intractable nausea and vomiting 12/05/2016  . Verbally abusive behavior 12/05/2016  . Normocytic anemia 12/05/2016  . Alcohol use disorder, severe, dependence ([place]) 07/25/2016  . Cocaine use disorder, severe, dependence ([place]) 07/25/2016  . 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 Leukocytosis   . Hospital acquired PNA 05/20/2015  . Pseudocyst of pancreas 05/18/2015  . Polysubstance abuse (tobacco, cocaine, THC, and ETOH) 03/26/2015  . Alcohol-induced chronic pancreatitis ([place])   . Benign essential HTN 02/06/2014  . Alcohol-induced acute pancreatitis 11/28/2013  . Pancreatic pseudocyst/cyst 11/25/2013  . Severe protein-calorie malnutrition ([place]) 10/10/2013  . Suicide attempt ([place]) 10/08/2013  . 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 TOBACCO ABUSE 03/23/2007    Past Surgical History:  Procedure Laterality Date  . CARDIAC CATHETERIZATION    . EYE SURGERY Left 1990's   "result of trauma"   . FACIAL FRACTURE SURGERY Left 1990's   "result of trauma"   . FRACTURE SURGERY    . HERNIA REPAIR    . LEFT HEART CATHETERIZATION WITH CORONARY ANGIOGRAM Right 03/07/2013   Procedure: LEFT HEART CATHETERIZATION WITH CORONARY ANGIOGRAM;  Surgeon: [name], MD;  Location: [facility];  Service: Cardiovascular;  Laterality: Right;  . UMBILICAL HERNIA REPAIR          Home Medications    Prior to Admission medications   Medication Sig Start Date End Date Taking? Authorizing Provider  albuterol (PROVENTIL HFA;VENTOLIN HFA) 108 (90 Base) MCG/ACT inhaler Inhale 2 puffs into the lungs every 6 (six) hours as needed for wheezing or shortness of breath. 08/29/17   Clent Demark, PA-C  folic acid (FOLVITE) 1 MG tablet Take 1 tablet (1 mg total) by mouth daily. 09/06/17    Bonnell Public, MD  hydrALAZINE (APRESOLINE) 25 MG tablet Take 1 tablet (25 mg total) by mouth every 8 (eight) hours. Patient taking differently: Take 25 mg by mouth at bedtime.  08/29/17   Clent Demark, PA-C  lipase/protease/amylase (CREON) 12000 units CPEP capsule Take 1 capsule (12,000 Units total) by mouth 3 (three) times daily with meals. For pancreatitis 10/01/17   Aline August, MD  loratadine (CLARITIN) 10 MG tablet Take 1 tablet (10 mg total) by mouth daily as needed for allergies or rhinitis. (May purchase from over the counter): For allergies 10/01/17   Aline August, MD  metoprolol tartrate (LOPRESSOR) 25 MG tablet Take 1 tablet (25 mg total) by mouth 2 (two) times daily. For high blood pressure 08/29/17   Clent Demark, PA-C  Multiple Vitamin (MULTIVITAMIN WITH MINERALS) TABS tablet Take 1 tablet by mouth daily. 09/06/17   Bonnell Public, MD  ondansetron (ZOFRAN) 4 MG tablet Take 4 mg by mouth 2 (two) times daily as needed for nausea/vomiting. 09/19/17   [provider]  oxyCODONE (ROXICODONE) 5 MG immediate release tablet Take 1 tablet (5 mg total) by mouth every 8 (eight) hours as needed for up to 14 doses for severe pain. 10/01/17   Aline August, MD  pantoprazole (PROTONIX) 40 MG tablet Take 1 tablet (40 mg total) by mouth daily. 08/29/17   Clent Demark, PA-C  sertraline (ZOLOFT) 100 MG tablet Take 1 tablet (100 mg total) by mouth daily. 08/29/17   Clent Demark, PA-C  sucralfate (CARAFATE) 1 g tablet Take 1 tablet by mouth 4 (four) times daily. 09/19/17   [provider]  thiamine 100 MG tablet Take 1 tablet (100 mg total) by mouth daily. 08/29/17   Clent Demark, PA-C  omeprazole (PRILOSEC) 20 MG capsule Take 1 capsule (20 mg total) daily by mouth. 03/22/17 06/21/17  Varney Biles, MD    Family History Family History  Problem Relation Age of Onset  . Hypertension Mother   . Cirrhosis Mother   . Alcoholism Mother   . Hypertension  Father   . Melanoma Father   . Hypertension Other   . Coronary artery disease Other     Social History Social History   Tobacco Use  . Smoking status: Current Every Day Smoker    Packs/day: 1.00    Years: 33.00    Pack years: 33.00    Types: Cigarettes, E-cigarettes  . Smokeless tobacco: Never Used  Substance Use Topics  . Alcohol use: Yes  . Drug use: Yes    Types: Marijuana, Cocaine    Comment: daily marijuana use; last cocaine use about 3 months ago     Allergies   Robaxin [methocarbamol]; Shellfish-derived products; Trazodone; Trazodone and nefazodone; Contrast media [iodinated diagnostic agents]; and Reglan [metoclopramide]   Review of Systems Review of Systems  All other systems reviewed and are negative.    Physical Exam Updated Vital  Signs BP (!) 152/108   Pulse (!) 110   Temp 98.1 F (36.7 C)   Resp 17   SpO2 100%   Physical Exam  Constitutional: He is oriented to person, place, and time. He appears well-developed and well-nourished.  Non-toxic appearance. No distress.  HENT:  Head: Normocephalic and atraumatic.  Eyes: Pupils are equal, round, and reactive to light. Conjunctivae, EOM and lids are normal.  Neck: Normal range of motion. Neck supple. No tracheal deviation present. No thyroid mass present.  Cardiovascular: Regular rhythm and normal heart sounds. Tachycardia present. Exam reveals no gallop.  No murmur heard. Pulmonary/Chest: Effort normal and breath sounds normal. No stridor. No respiratory distress. He has no decreased breath sounds. He has no wheezes. He has no rhonchi. He has no rales.  Abdominal: Soft. Normal appearance and bowel sounds are normal. He exhibits no distension. There is tenderness in the epigastric area. There is no rigidity, no rebound, no guarding and no CVA tenderness.    Musculoskeletal: Normal range of motion. He exhibits no edema or tenderness.  Neurological: He is alert and oriented to person, place, and time. He  has normal strength. No cranial nerve deficit or sensory deficit. GCS eye subscore is 4. GCS verbal subscore is 5. GCS motor subscore is 6.  Skin: Skin is warm and dry. No abrasion and no rash noted.  Psychiatric: He has a normal mood and affect. His speech is normal and behavior is normal.  Nursing note and vitals reviewed.    ED Treatments / Results  Labs (all labs ordered are listed, but only abnormal results are displayed) Labs Reviewed  LIPASE, BLOOD - Abnormal; Notable for the following components:      Result Value   Lipase 118 (*)    All other components within normal limits  COMPREHENSIVE METABOLIC PANEL - Abnormal; Notable for the following components:   Glucose, Bld 120 (*)    All other components within normal limits  CBC - Abnormal; Notable for the following components:   WBC 12.2 (*)    RBC 4.02 (*)    Hemoglobin 9.4 (*)    HCT 30.0 (*)    MCV 74.6 (*)    MCH 23.4 (*)    RDW 19.1 (*)    All other components within normal limits  URINALYSIS, ROUTINE W REFLEX MICROSCOPIC  ETHANOL  RAPID URINE DRUG SCREEN, HOSP PERFORMED  I-STAT TROPONIN, ED    EKG EKG Interpretation  Date/Time:  Thursday October 19 2017 18:58:52 EDT Ventricular Rate:  125 PR Interval:  122 QRS Duration: 76 QT Interval:  312 QTC Calculation: 450 R Axis:   80 Text Interpretation:  Sinus tachycardia Biatrial enlargement Abnormal ECG No significant change since last tracing Confirmed by Pattricia Boss [phone]) on 10/19/2017 7:03:29 PM   Radiology Dg Chest 2 View  Result Date: 10/19/2017 CLINICAL DATA:  Chest pain EXAM: CHEST - 2 VIEW COMPARISON:  September 04, 2017 FINDINGS: The heart size and mediastinal contours are within normal limits. Both lungs are clear. The visualized skeletal structures are unremarkable. IMPRESSION: No active cardiopulmonary disease. Electronically Signed   By: Dorise Bullion III M.D   On: 10/19/2017 19:32    Procedures Procedures (including critical care  time)  Medications Ordered in ED Medications  sodium chloride 0.9 % bolus 2,000 mL (has no administration in time range)  0.9 %  sodium chloride infusion (has no administration in time range)  LORazepam (ATIVAN) injection 1 mg (has no administration in time range)  gi  cocktail (Maalox,Lidocaine,Donnatal) (has no administration in time range)  famotidine (PEPCID) tablet 40 mg (has no administration in time range)  sucralfate (CARAFATE) tablet 1 g (has no administration in time range)  ondansetron (ZOFRAN) injection 4 mg (has no administration in time range)     Initial Impression / Assessment and Plan / ED Course  I have reviewed the triage vital signs and the nursing notes.  Pertinent labs & imaging results that were available during my care of the patient were reviewed by me and considered in my medical decision making (see chart for details).     Patient given IV fluids as well as antiemetics, cocktail, Pepcid and Carafate.  Lipase noted and this is around his baseline.  Patient's drug screen noted for cocaine, THC, benzos.  Patient has known history of alcoholic gastritis and suspect this is the cause of it.  He has been prescribed multiple medications for this as well as given multiple referrals which she has been noncompliant with.  Patient encouraged to abstain from alcohol  Final Clinical Impressions(s) / ED Diagnoses   Final diagnoses:  None    ED Discharge Orders    None       Lacretia Leigh, MD 10/19/17 [id number]

## 2017-10-19 NOTE — ED Triage Notes (Addendum)
Pt arrives for c.o. Abdominal pain, nausea vomiting and chest pain. Seen 5/31 for same. Pt requesting Morphine IV and upset that EMS did not give any. Hx of pancreatitis. EMS gave him 1 nitro. 18G LAC. Last drink was over 1 week ago.

## 2017-10-23 ENCOUNTER — Emergency Department (HOSPITAL_COMMUNITY)
Admission: EM | Admit: 2017-10-23 | Discharge: 2017-10-23 | Disposition: A | Discharge status: Home / Self Care | Payer: Self-pay | Attending: Emergency Medicine | Admitting: Emergency Medicine

## 2017-10-23 ENCOUNTER — Other Ambulatory Visit: Payer: Self-pay

## 2017-10-23 ENCOUNTER — Encounter (HOSPITAL_COMMUNITY): Payer: Self-pay | Admitting: Oncology

## 2017-10-23 DIAGNOSIS — J45909 Unspecified asthma, uncomplicated: Secondary | ICD-10-CM | POA: Insufficient documentation

## 2017-10-23 DIAGNOSIS — Z79899 Other long term (current) drug therapy: Secondary | ICD-10-CM | POA: Insufficient documentation

## 2017-10-23 DIAGNOSIS — K860 Alcohol-induced chronic pancreatitis: Secondary | ICD-10-CM | POA: Insufficient documentation

## 2017-10-23 DIAGNOSIS — K2920 Alcoholic gastritis without bleeding: Secondary | ICD-10-CM

## 2017-10-23 DIAGNOSIS — E7800 Pure hypercholesterolemia, unspecified: Secondary | ICD-10-CM | POA: Insufficient documentation

## 2017-10-23 DIAGNOSIS — D573 Sickle-cell trait: Secondary | ICD-10-CM | POA: Insufficient documentation

## 2017-10-23 DIAGNOSIS — F17210 Nicotine dependence, cigarettes, uncomplicated: Secondary | ICD-10-CM | POA: Insufficient documentation

## 2017-10-23 DIAGNOSIS — I10 Essential (primary) hypertension: Secondary | ICD-10-CM | POA: Insufficient documentation

## 2017-10-23 LAB — CBC
HCT: 26.9 % — ABNORMAL LOW (ref 39.0–52.0)
Hemoglobin: 8.4 g/dL — ABNORMAL LOW (ref 13.0–17.0)
MCH: 23.5 pg — AB (ref 26.0–34.0)
MCHC: 31.2 g/dL (ref 30.0–36.0)
MCV: 75.4 fL — ABNORMAL LOW (ref 78.0–100.0)
PLATELETS: 158 10*3/uL (ref 150–400)
RBC: 3.57 MIL/uL — ABNORMAL LOW (ref 4.22–5.81)
RDW: 20 % — ABNORMAL HIGH (ref 11.5–15.5)
WBC: 8.8 10*3/uL (ref 4.0–10.5)

## 2017-10-23 LAB — COMPREHENSIVE METABOLIC PANEL
ALK PHOS: 69 U/L (ref 38–126)
ALT: 20 U/L (ref 17–63)
ANION GAP: 10 (ref 5–15)
AST: 29 U/L (ref 15–41)
Albumin: 3.5 g/dL (ref 3.5–5.0)
BUN: 7 mg/dL (ref 6–20)
CALCIUM: 8.4 mg/dL — AB (ref 8.9–10.3)
CHLORIDE: 105 mmol/L (ref 101–111)
CO2: 22 mmol/L (ref 22–32)
Creatinine, Ser: 0.89 mg/dL (ref 0.61–1.24)
GFR calc non Af Amer: >60 mL/min (ref >60)
Glucose, Bld: 106 mg/dL — ABNORMAL HIGH (ref 65–99)
Potassium: 3.8 mmol/L (ref 3.5–5.1)
SODIUM: 137 mmol/L (ref 135–145)
Total Bilirubin: 0.4 mg/dL (ref 0.3–1.2)
Total Protein: 7.2 g/dL (ref 6.5–8.1)

## 2017-10-23 LAB — LIPASE, BLOOD: Lipase: 65 U/L — ABNORMAL HIGH (ref 11–51)

## 2017-10-23 MED ORDER — GI COCKTAIL ~~LOC~~
30.0000 mL | Freq: Once | ORAL | Status: AC
  Administered 2017-10-23: 30 mL via ORAL
  Filled 2017-10-23: qty 30

## 2017-10-23 MED ORDER — KETOROLAC TROMETHAMINE 15 MG/ML IJ SOLN
15.0000 mg | Freq: Once | INTRAMUSCULAR | Status: AC
  Administered 2017-10-23: 15 mg via INTRAVENOUS
  Filled 2017-10-23: qty 1

## 2017-10-23 MED ORDER — ACETAMINOPHEN 500 MG PO TABS
1000.0000 mg | ORAL_TABLET | Freq: Once | ORAL | Status: AC
  Administered 2017-10-23: 1000 mg via ORAL
  Filled 2017-10-23: qty 2

## 2017-10-23 NOTE — ED Notes (Signed)
Mia PA at bedside

## 2017-10-23 NOTE — ED Triage Notes (Signed)
P bib [facility] from home d/t chronic abdominal pain.  Pt reports not consuming ETOH for several days.  Pt ambulatory to room.

## 2017-10-23 NOTE — ED Notes (Signed)
Pt ambulated to restroom without difficulty, did not appear to be in any distress.

## 2017-10-23 NOTE — ED Provider Notes (Signed)
[facility]ERGENCY DEPARTMENT Provider Note   CSN: [id number] Arrival date & time: 10/23/17  0347     History   Chief Complaint Chief Complaint  Patient presents with  . Abdominal Pain    HPI [name] is a 47 y.o. male with a history of alcohol use disorder, cocaine use disorder, GERD, chronic pancreatitis, and WPW BIB EMS who presents to the emergency department with a chief complaint of epigastric pain.  The patient endorses sudden onset non-radiating epigastric pain, characterized as stabbing, that began at approximately 3-3.5 hours PTA.  Pain is worse when he feels like he has to have bowel movement and improved by nothing.  He reports associated nonbloody diarrhea, which he reports is baseline and NBNB emesis.  He treated his symptoms prior to arrival with Carafate, pantoprazole, and 1 tablet of his home oxycodone at 4:30.  No improvement of his symptoms with treatment.  Last drink 5 days ago when he consumed a six pack of beer.  He denies a fever, chills, back pain, dysuria, hematuria, melena, hematochezia, dyspnea, lightheadedness, chest pain, or fatigue.  He denies a history of seizures from alcohol withdrawal or delirium tremens.  No history of GI bleed.  The history is provided by the patient. No language interpreter was used.  Abdominal Pain   Associated symptoms include diarrhea and vomiting. Pertinent negatives include fever, nausea, constipation, dysuria, hematuria, headaches and myalgias.    Past Medical History:  Diagnosis Date  . Alcoholism /alcohol abuse ([place])   . Anemia   . Anxiety   . Arthritis    "knees; arms; elbows" (03/26/2015)  . Asthma   . Bipolar disorder ([place])   . Chronic bronchitis ([place])   . Chronic lower back pain   . Chronic pancreatitis ([place])   . Cocaine abuse ([place])   . Depression   . Family history of adverse reaction to anesthesia    "grandmother gets confused"  . Femoral condyle fracture ([place])  03/08/2014   left medial/notes 03/09/2014  . GERD (gastroesophageal reflux disease)   . H/O hiatal hernia   . H/O suicide attempt 10/2012  . Heart murmur    "when he was little" (03/06/2013)  . High cholesterol   . History of blood transfusion 10/2012   "when I tried to commit suicide"  . History of stomach ulcers   . Hypertension   . Marijuana abuse, continuous   . Migraine    "a few times/year" (03/26/2015)  . Pneumonia 1990's X 3  . PTSD (post-traumatic stress disorder)   . Shortness of breath    "can happen at anytime" (03/06/2013)  . Sickle cell trait ([place])   . WPW (Wolff-Parkinson-White syndrome)    [name] 03/06/2013    Patient Active Problem List   Diagnosis Date Noted  . Acute pancreatitis 09/28/2017  . Abdominal pain 05/27/2017  . Hematemesis 05/27/2017  . Tachycardia 03/18/2017  . Diarrhea 03/18/2017  . Acute on chronic pancreatitis ([place]) 12/17/2016  . Intractable nausea and vomiting 12/05/2016  . Verbally abusive behavior 12/05/2016  . Normocytic anemia 12/05/2016  . Alcohol use disorder, severe, dependence ([place]) 07/25/2016  . Cocaine use disorder, severe, dependence ([place]) 07/25/2016  . Major depressive disorder, recurrent severe without psychotic features ([place]) 07/20/2016  . Leukocytosis   . Hospital acquired PNA 05/20/2015  . Pseudocyst of pancreas 05/18/2015  . Polysubstance abuse (tobacco, cocaine, THC, and ETOH) 03/26/2015  . Alcohol-induced chronic pancreatitis ([place])   . Benign essential HTN 02/06/2014  . Alcohol-induced acute  pancreatitis 11/28/2013  . Pancreatic pseudocyst/cyst 11/25/2013  . Severe protein-calorie malnutrition ([place]) 10/10/2013  . Suicide attempt ([place]) 10/08/2013  . 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 TOBACCO ABUSE 03/23/2007    Past Surgical History:  Procedure Laterality Date  . CARDIAC CATHETERIZATION    . EYE SURGERY Left 1990's   "result of trauma"   . FACIAL FRACTURE SURGERY Left 1990's    "result of trauma"   . FRACTURE SURGERY    . HERNIA REPAIR    . LEFT HEART CATHETERIZATION WITH CORONARY ANGIOGRAM Right 03/07/2013   Procedure: LEFT HEART CATHETERIZATION WITH CORONARY ANGIOGRAM;  Surgeon: [name], MD;  Location: [facility] CATH LAB;  Service: Cardiovascular;  Laterality: Right;  . UMBILICAL HERNIA REPAIR          Home Medications    Prior to Admission medications   Medication Sig Start Date End Date Taking? Authorizing Provider  albuterol (PROVENTIL HFA;VENTOLIN HFA) 108 (90 Base) MCG/ACT inhaler Inhale 2 puffs into the lungs every 6 (six) hours as needed for wheezing or shortness of breath. 08/29/17  Yes Clent Demark, PA-C  famotidine (PEPCID) 20 MG tablet Take 20 mg by mouth 2 (two) times daily.   Yes [provider]  hydrALAZINE (APRESOLINE) 25 MG tablet Take 1 tablet (25 mg total) by mouth every 8 (eight) hours. Patient taking differently: Take 25 mg by mouth at bedtime.  08/29/17  Yes Clent Demark, PA-C  hydrOXYzine (VISTARIL) 50 MG capsule Take 50 mg by mouth 3 (three) times daily as needed for anxiety.    Yes [provider]  lipase/protease/amylase (CREON) 12000 units CPEP capsule Take 1 capsule (12,000 Units total) by mouth 3 (three) times daily with meals. For pancreatitis 10/01/17  Yes Aline August, MD  loratadine (CLARITIN) 10 MG tablet Take 1 tablet (10 mg total) by mouth daily as needed for allergies or rhinitis. (May purchase from over the counter): For allergies 10/01/17  Yes Aline August, MD  metoprolol tartrate (LOPRESSOR) 25 MG tablet Take 1 tablet (25 mg total) by mouth 2 (two) times daily. For high blood pressure 08/29/17  Yes Clent Demark, PA-C  Multiple Vitamin (MULTIVITAMIN WITH MINERALS) TABS tablet Take 1 tablet by mouth daily. 09/06/17  Yes Dana Allan I, MD  pantoprazole (PROTONIX) 40 MG tablet Take 1 tablet (40 mg total) by mouth daily. 08/29/17  Yes Clent Demark, PA-C  sertraline (ZOLOFT) 100 MG  tablet Take 1 tablet (100 mg total) by mouth daily. 08/29/17  Yes Clent Demark, PA-C  sucralfate (CARAFATE) 1 g tablet Take 1 tablet by mouth 4 (four) times daily. 09/19/17  Yes [provider]  omeprazole (PRILOSEC) 20 MG capsule Take 1 capsule (20 mg total) daily by mouth. 03/22/17 06/21/17  Varney Biles, MD    Family History Family History  Problem Relation Age of Onset  . Hypertension Mother   . Cirrhosis Mother   . Alcoholism Mother   . Hypertension Father   . Melanoma Father   . Hypertension Other   . Coronary artery disease Other     Social History Social History   Tobacco Use  . Smoking status: Current Every Day Smoker    Packs/day: 1.00    Years: 33.00    Pack years: 33.00    Types: Cigarettes, E-cigarettes  . Smokeless tobacco: Never Used  Substance Use Topics  . Alcohol use: Yes  . Drug use: Yes    Types: Marijuana, Cocaine    Comment:  daily marijuana use; last cocaine use about 3 months ago     Allergies   Robaxin [methocarbamol]; Shellfish-derived products; Trazodone; Trazodone and nefazodone; Contrast media [iodinated diagnostic agents]; and Reglan [metoclopramide]   Review of Systems Review of Systems  Constitutional: Negative for appetite change and fever.  Eyes: Positive for visual disturbance.  Respiratory: Negative for shortness of breath.   Cardiovascular: Negative for chest pain and palpitations.  Gastrointestinal: Positive for abdominal pain, diarrhea and vomiting. Negative for abdominal distention, anal bleeding, blood in stool, constipation and nausea.  Genitourinary: Negative for dysuria, hematuria, penile swelling and testicular pain.  Musculoskeletal: Negative for back pain and myalgias.  Skin: Negative for rash.  Allergic/Immunologic: Negative for immunocompromised state.  Neurological: Positive for dizziness and light-headedness. Negative for syncope, weakness and headaches.  Psychiatric/Behavioral: Negative for confusion.    Physical Exam Updated Vital Signs BP (!) 135/94 (BP Location: Right Arm)   Pulse 91   Temp 98.4 F (36.9 C) (Oral)   Resp 14   Ht 5\' 9"  (1.753 m)   Wt 65.8 kg (145 lb)   SpO2 100%   BMI 21.41 kg/m   Physical Exam  Constitutional: He appears well-developed.  HENT:  Head: Normocephalic.  Eyes: Conjunctivae are normal.  Neck: Neck supple.  Cardiovascular: Normal rate, regular rhythm, normal heart sounds and intact distal pulses. Exam reveals no gallop and no friction rub.  No murmur heard. Pulmonary/Chest: Effort normal and breath sounds normal. No stridor. No respiratory distress. He has no wheezes. He has no rales. He exhibits no tenderness.  Abdominal: Soft. He exhibits no distension and no mass. There is tenderness in the epigastric area. There is no rebound and no guarding. No hernia.  Tender to palpation in the epigastric region without rebound or guarding.  No peritoneal signs.  No CVA tenderness bilaterally.  Negative Murphy sign.  No tenderness over McBurney's point.  Musculoskeletal: He exhibits no edema, tenderness or deformity.  Neurological: He is alert.  Skin: Skin is warm and dry. Capillary refill takes less than 2 seconds.  Psychiatric: His behavior is normal.  Nursing note and vitals reviewed.  ED Treatments / Results  Labs (all labs ordered are listed, but only abnormal results are displayed) Labs Reviewed  CBC - Abnormal; Notable for the following components:      Result Value   RBC 3.57 (*)    Hemoglobin 8.4 (*)    HCT 26.9 (*)    MCV 75.4 (*)    MCH 23.5 (*)    RDW 20.0 (*)    All other components within normal limits  COMPREHENSIVE METABOLIC PANEL - Abnormal; Notable for the following components:   Glucose, Bld 106 (*)    Calcium 8.4 (*)    All other components within normal limits  LIPASE, BLOOD - Abnormal; Notable for the following components:   Lipase 65 (*)    All other components within normal limits    EKG None  Radiology No results  found.  Procedures Procedures (including critical care time)  Medications Ordered in ED Medications  acetaminophen (TYLENOL) tablet 1,000 mg (1,000 mg Oral Given 10/23/17 0712)  gi cocktail (Maalox,Lidocaine,Donnatal) (30 mLs Oral Given 10/23/17 1324)  ketorolac (TORADOL) 15 MG/ML injection 15 mg (15 mg Intravenous Given 10/23/17 0840)     Initial Impression / Assessment and Plan / ED Course  I have reviewed the triage vital signs and the nursing notes.  Pertinent labs & imaging results that were available during my care of the patient were  reviewed by me and considered in my medical decision making (see chart for details).     47 year old male with a history of alcohol use disorder, cocaine use disorder, GERD, chronic pancreatitis, and WPW BIB EMS who presents to the emergency department with a chief complaint of epigastric pain.  He is well-known to the emergency department with 29 visits in the last 6 months.  On exam, the patient has reproducible epigastric tenderness, but no signs of peritonitis.  Exam is reassuring.  He is hemodynamically stable without tachycardia or fever.  Nontoxic-appearing.  He does not have a surgical abdomen.  Pain is unchanged from previous episodes of pain for which she has been evaluated in the ED many times.    Labs are reassuring; Hemoglobin unchanged from 6/14 at 8.4. Lipase significantly decreased to 63 from previous.  Doubt dehydration, GI bleed, or acute pancreatitis. Suspect alcoholic gastritis.  GI cocktail given in the ED.  The patient has requested an injection of Toradol.  He has no history of a GI bleed this has been given.  Reports that he has an upcoming appointment with gastroenterology, and I have encouraged him to keep the appointment for follow-up.  Recommended continuation of abstaining from alcohol.  Strict return precautions given.  The patient is hemodynamically stable in no acute distress.  He is safe for discharge home at this time with  outpatient follow-up.   Final Clinical Impressions(s) / ED Diagnoses   Final diagnoses:  Chronic alcoholic gastritis without hemorrhage    ED Discharge Orders    None       McDonald, Mia A, PA-C 10/23/17 2979    Varney Biles, MD 11/01/17 0730

## 2017-10-23 NOTE — Discharge Instructions (Addendum)
Thank you for allowing me to provide your care today in the emergency department.  Please keep your follow-up appointment with gastroenterology.  Continue to take your home medications as directed.  Return to the emergency department if you develop bright red blood in your stools, dark black or maroon stools, if you pass out, vomiting, have a seizure, or develop other new, concerning symptoms.

## 2017-10-25 ENCOUNTER — Ambulatory Visit: Payer: Self-pay | Admitting: Physician Assistant

## 2017-10-30 ENCOUNTER — Encounter: Payer: Self-pay | Admitting: Internal Medicine

## 2017-11-02 ENCOUNTER — Other Ambulatory Visit: Payer: Self-pay

## 2017-11-02 ENCOUNTER — Encounter (HOSPITAL_COMMUNITY): Payer: Self-pay

## 2017-11-02 ENCOUNTER — Emergency Department (HOSPITAL_COMMUNITY)
Admission: EM | Admit: 2017-11-02 | Discharge: 2017-11-02 | Disposition: A | Discharge status: Home / Self Care | Payer: Self-pay | Attending: Emergency Medicine | Admitting: Emergency Medicine

## 2017-11-02 DIAGNOSIS — K2920 Alcoholic gastritis without bleeding: Secondary | ICD-10-CM | POA: Insufficient documentation

## 2017-11-02 DIAGNOSIS — Z79899 Other long term (current) drug therapy: Secondary | ICD-10-CM | POA: Insufficient documentation

## 2017-11-02 DIAGNOSIS — J45909 Unspecified asthma, uncomplicated: Secondary | ICD-10-CM | POA: Insufficient documentation

## 2017-11-02 DIAGNOSIS — F17210 Nicotine dependence, cigarettes, uncomplicated: Secondary | ICD-10-CM | POA: Insufficient documentation

## 2017-11-02 DIAGNOSIS — I10 Essential (primary) hypertension: Secondary | ICD-10-CM | POA: Insufficient documentation

## 2017-11-02 LAB — URINALYSIS, ROUTINE W REFLEX MICROSCOPIC
BILIRUBIN URINE: NEGATIVE
Glucose, UA: NEGATIVE mg/dL
HGB URINE DIPSTICK: NEGATIVE
Ketones, ur: NEGATIVE mg/dL
Leukocytes, UA: NEGATIVE
NITRITE: NEGATIVE
PROTEIN: NEGATIVE mg/dL
SPECIFIC GRAVITY, URINE: 1.017 (ref 1.005–1.030)
pH: 5 (ref 5.0–8.0)

## 2017-11-02 LAB — COMPREHENSIVE METABOLIC PANEL
ALBUMIN: 4 g/dL (ref 3.5–5.0)
ALT: 24 U/L (ref 0–44)
AST: 34 U/L (ref 15–41)
Alkaline Phosphatase: 77 U/L (ref 38–126)
Anion gap: 13 (ref 5–15)
BILIRUBIN TOTAL: 0.7 mg/dL (ref 0.3–1.2)
BUN: 7 mg/dL (ref 6–20)
CO2: 22 mmol/L (ref 22–32)
Calcium: 9.3 mg/dL (ref 8.9–10.3)
Chloride: 103 mmol/L (ref 98–111)
Creatinine, Ser: 0.89 mg/dL (ref 0.61–1.24)
GFR calc Af Amer: >60 mL/min (ref >60)
GFR calc non Af Amer: >60 mL/min (ref >60)
GLUCOSE: 106 mg/dL — AB (ref 70–99)
Potassium: 4.2 mmol/L (ref 3.5–5.1)
Sodium: 138 mmol/L (ref 135–145)
TOTAL PROTEIN: 8.1 g/dL (ref 6.5–8.1)

## 2017-11-02 LAB — CBC
HCT: 30.1 % — ABNORMAL LOW (ref 39.0–52.0)
HEMOGLOBIN: 9.2 g/dL — AB (ref 13.0–17.0)
MCH: 23.4 pg — AB (ref 26.0–34.0)
MCHC: 30.6 g/dL (ref 30.0–36.0)
MCV: 76.6 fL — ABNORMAL LOW (ref 78.0–100.0)
PLATELETS: 274 10*3/uL (ref 150–400)
RBC: 3.93 MIL/uL — AB (ref 4.22–5.81)
RDW: 20.8 % — ABNORMAL HIGH (ref 11.5–15.5)
WBC: 9.2 10*3/uL (ref 4.0–10.5)

## 2017-11-02 LAB — LIPASE, BLOOD: Lipase: 69 U/L — ABNORMAL HIGH (ref 11–51)

## 2017-11-02 MED ORDER — ONDANSETRON HCL 4 MG/2ML IJ SOLN
4.0000 mg | Freq: Once | INTRAMUSCULAR | Status: AC
  Administered 2017-11-02: 4 mg via INTRAVENOUS
  Filled 2017-11-02: qty 2

## 2017-11-02 MED ORDER — ONDANSETRON HCL 4 MG PO TABS: 4.0000 mg | ORAL_TABLET | Freq: Three times a day (TID) | ORAL | 0 refills | Status: DC | PRN

## 2017-11-02 MED ORDER — HYDROXYZINE HCL 25 MG PO TABS
25.0000 mg | ORAL_TABLET | Freq: Once | ORAL | Status: AC
  Administered 2017-11-02: 25 mg via ORAL
  Filled 2017-11-02: qty 1

## 2017-11-02 MED ORDER — ACETAMINOPHEN 500 MG PO TABS
1000.0000 mg | ORAL_TABLET | Freq: Once | ORAL | Status: AC
  Administered 2017-11-02: 1000 mg via ORAL
  Filled 2017-11-02: qty 2

## 2017-11-02 MED ORDER — KETOROLAC TROMETHAMINE 30 MG/ML IJ SOLN
30.0000 mg | Freq: Once | INTRAMUSCULAR | Status: AC
  Administered 2017-11-02: 30 mg via INTRAVENOUS
  Filled 2017-11-02: qty 1

## 2017-11-02 MED ORDER — GI COCKTAIL ~~LOC~~
30.0000 mL | Freq: Once | ORAL | Status: AC
  Administered 2017-11-02: 30 mL via ORAL
  Filled 2017-11-02: qty 30

## 2017-11-02 NOTE — ED Triage Notes (Signed)
[facility]- pt coming from home with complaint of abdominal, hx of pancreatitis, n/v. Pain X1 hour.   140/80 94hr 18rr CBG 113

## 2017-11-02 NOTE — ED Provider Notes (Signed)
[facility]ENC[facility] DEPARTMENT Provider Note   CSN: [id number] Arrival date & time: 11/02/17  1208     History   Chief Complaint Chief Complaint  Patient presents with  . Abdominal Pain    HPI Cross Dakwan Pridgen is a 47 y.o. male with a history of alcohol use disorder, cocaine use disorder, GERD, WPW, and chronic pancreatitis BIB EMS who presents to the emergency department with a chief complaint of epigastric pain.  He endorses nonradiating epigastric pain that began suddenly 1 hour prior to arrival.  He characterizes the pain as sharp.  He reports associated nausea and vomiting.  He reports nonbloody diarrhea, which is his baseline.  He denies fever, chills, chest pain, dyspnea, dysuria, or back pain.    He is well-known to this ED with 30 visits over the last 3 months.  He has been given multiple referrals to GI, but states that he is unable to afford the co-pay for the visit.  The history is provided by the patient. No language interpreter was used.    Past Medical History:  Diagnosis Date  . Alcoholism /alcohol abuse ([place])   . Anemia   . Anxiety   . Arthritis    "knees; arms; elbows" (03/26/2015)  . Asthma   . Bipolar disorder ([place])   . Chronic bronchitis ([place])   . Chronic lower back pain   . Chronic pancreatitis ([place])   . Cocaine abuse ([place])   . Depression   . Family history of adverse reaction to anesthesia    "grandmother gets confused"  . Femoral condyle fracture ([place]) 03/08/2014   left medial/notes 03/09/2014  . GERD (gastroesophageal reflux disease)   . H/O hiatal hernia   . H/O suicide attempt 10/2012  . Heart murmur    "when he was little" (03/06/2013)  . High cholesterol   . History of blood transfusion 10/2012   "when I tried to commit suicide"  . History of stomach ulcers   . Hypertension   . Marijuana abuse, continuous   . Migraine    "a few times/year" (03/26/2015)  . Pneumonia 1990's X 3  . PTSD (post-traumatic stress  disorder)   . Shortness of breath    "can happen at anytime" (03/06/2013)  . Sickle cell trait ([place])   . WPW (Wolff-Parkinson-White syndrome)    [name] 03/06/2013    Patient Active Problem List   Diagnosis Date Noted  . Acute pancreatitis 09/28/2017  . Abdominal pain 05/27/2017  . Hematemesis 05/27/2017  . Tachycardia 03/18/2017  . Diarrhea 03/18/2017  . Acute on chronic pancreatitis ([place]) 12/17/2016  . Intractable nausea and vomiting 12/05/2016  . Verbally abusive behavior 12/05/2016  . Normocytic anemia 12/05/2016  . Alcohol use disorder, severe, dependence ([place]) 07/25/2016  . Cocaine use disorder, severe, dependence ([place]) 07/25/2016  . Major depressive disorder, recurrent severe without psychotic features ([place]) 07/20/2016  . Leukocytosis   . Hospital acquired PNA 05/20/2015  . Pseudocyst of pancreas 05/18/2015  . Polysubstance abuse (tobacco, cocaine, THC, and ETOH) 03/26/2015  . Alcohol-induced chronic pancreatitis ([place])   . Benign essential HTN 02/06/2014  . Alcohol-induced acute pancreatitis 11/28/2013  . Pancreatic pseudocyst/cyst 11/25/2013  . Severe protein-calorie malnutrition ([place]) 10/10/2013  . Suicide attempt ([place]) 10/08/2013  . 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 TOBACCO ABUSE 03/23/2007    Past Surgical History:  Procedure Laterality Date  . CARDIAC CATHETERIZATION    . EYE SURGERY Left 1990's   "result of trauma"   .  FACIAL FRACTURE SURGERY Left 1990's   "result of trauma"   . FRACTURE SURGERY    . HERNIA REPAIR    . LEFT HEART CATHETERIZATION WITH CORONARY ANGIOGRAM Right 03/07/2013   Procedure: LEFT HEART CATHETERIZATION WITH CORONARY ANGIOGRAM;  Surgeon: [name], MD;  Location: [facility] CATH LAB;  Service: Cardiovascular;  Laterality: Right;  . UMBILICAL HERNIA REPAIR          Home Medications    Prior to Admission medications   Medication Sig Start Date End Date Taking? Authorizing Provider  albuterol  (PROVENTIL HFA;VENTOLIN HFA) 108 (90 Base) MCG/ACT inhaler Inhale 2 puffs into the lungs every 6 (six) hours as needed for wheezing or shortness of breath. 08/29/17   Clent Demark, PA-C  famotidine (PEPCID) 20 MG tablet Take 20 mg by mouth 2 (two) times daily.    [provider]  hydrALAZINE (APRESOLINE) 25 MG tablet Take 1 tablet (25 mg total) by mouth every 8 (eight) hours. Patient taking differently: Take 25 mg by mouth at bedtime.  08/29/17   Clent Demark, PA-C  hydrOXYzine (VISTARIL) 50 MG capsule Take 50 mg by mouth 3 (three) times daily as needed for anxiety.     [provider]  lipase/protease/amylase (CREON) 12000 units CPEP capsule Take 1 capsule (12,000 Units total) by mouth 3 (three) times daily with meals. For pancreatitis 10/01/17   Aline August, MD  loratadine (CLARITIN) 10 MG tablet Take 1 tablet (10 mg total) by mouth daily as needed for allergies or rhinitis. (May purchase from over the counter): For allergies 10/01/17   Aline August, MD  metoprolol tartrate (LOPRESSOR) 25 MG tablet Take 1 tablet (25 mg total) by mouth 2 (two) times daily. For high blood pressure 08/29/17   Clent Demark, PA-C  Multiple Vitamin (MULTIVITAMIN WITH MINERALS) TABS tablet Take 1 tablet by mouth daily. 09/06/17   Bonnell Public, MD  ondansetron (ZOFRAN) 4 MG tablet Take 1 tablet (4 mg total) by mouth every 8 (eight) hours as needed for nausea or vomiting. 11/02/17   Aislee Landgren A, PA-C  pantoprazole (PROTONIX) 40 MG tablet Take 1 tablet (40 mg total) by mouth daily. 08/29/17   Clent Demark, PA-C  sertraline (ZOLOFT) 100 MG tablet Take 1 tablet (100 mg total) by mouth daily. 08/29/17   Clent Demark, PA-C  sucralfate (CARAFATE) 1 g tablet Take 1 tablet by mouth 4 (four) times daily. 09/19/17   [provider]  omeprazole (PRILOSEC) 20 MG capsule Take 1 capsule (20 mg total) daily by mouth. 03/22/17 06/21/17  Varney Biles, MD    Family  History Family History  Problem Relation Age of Onset  . Hypertension Mother   . Cirrhosis Mother   . Alcoholism Mother   . Hypertension Father   . Melanoma Father   . Hypertension Other   . Coronary artery disease Other     Social History Social History   Tobacco Use  . Smoking status: Current Every Day Smoker    Packs/day: 1.00    Years: 33.00    Pack years: 33.00    Types: Cigarettes, E-cigarettes  . Smokeless tobacco: Never Used  Substance Use Topics  . Alcohol use: Yes  . Drug use: Yes    Types: Marijuana, Cocaine    Comment: daily marijuana use; last cocaine use about 3 months ago     Allergies   Robaxin [methocarbamol]; Shellfish-derived products; Trazodone; Trazodone and nefazodone; Contrast media [iodinated diagnostic agents]; and Reglan [metoclopramide]  Review of Systems Review of Systems  Constitutional: Negative for appetite change, chills and fever.  HENT: Negative for congestion and sore throat.   Eyes: Negative for visual disturbance.  Respiratory: Negative for shortness of breath.   Cardiovascular: Negative for chest pain.  Gastrointestinal: Positive for abdominal pain, diarrhea, nausea and vomiting.  Genitourinary: Negative for dysuria.  Musculoskeletal: Negative for back pain.  Skin: Negative for rash.  Allergic/Immunologic: Negative for immunocompromised state.  Neurological: Negative for dizziness, weakness, numbness and headaches.  Psychiatric/Behavioral: Negative for confusion.   Physical Exam Updated Vital Signs BP (!) 155/104 (BP Location: Left Arm)   Pulse 88   Temp 98.6 F (37 C) (Oral)   Resp 16   SpO2 100%   Physical Exam  Constitutional: He appears well-developed.  HENT:  Head: Normocephalic.  Eyes: Conjunctivae are normal.  Neck: Neck supple.  Cardiovascular: Normal rate, regular rhythm, normal heart sounds and intact distal pulses. Exam reveals no gallop and no friction rub.  No murmur heard. Pulmonary/Chest: Effort  normal and breath sounds normal. No stridor. No respiratory distress. He has no wheezes. He has no rales. He exhibits no tenderness.  Abdominal: Soft. Bowel sounds are normal. He exhibits no distension and no mass. There is tenderness. There is no rebound and no guarding. No hernia.  Tender to palpation in the epigastric region without rebound or guarding.  No CVA tenderness bilaterally.  Negative Murphy sign.  No tenderness over McBurney's point.  No peritoneal signs.  Musculoskeletal: He exhibits no edema, tenderness or deformity.  Neurological: He is alert.  Skin: Skin is warm and dry.  Psychiatric: His behavior is normal.  Nursing note and vitals reviewed.  ED Treatments / Results  Labs (all labs ordered are listed, but only abnormal results are displayed) Labs Reviewed  LIPASE, BLOOD - Abnormal; Notable for the following components:      Result Value   Lipase 69 (*)    All other components within normal limits  COMPREHENSIVE METABOLIC PANEL - Abnormal; Notable for the following components:   Glucose, Bld 106 (*)    All other components within normal limits  CBC - Abnormal; Notable for the following components:   RBC 3.93 (*)    Hemoglobin 9.2 (*)    HCT 30.1 (*)    MCV 76.6 (*)    MCH 23.4 (*)    RDW 20.8 (*)    All other components within normal limits  URINALYSIS, ROUTINE W REFLEX MICROSCOPIC    EKG None  Radiology No results found.  Procedures Procedures (including critical care time)  Medications Ordered in ED Medications  ketorolac (TORADOL) 30 MG/ML injection 30 mg (30 mg Intravenous Given 11/02/17 1526)  ondansetron (ZOFRAN) injection 4 mg (4 mg Intravenous Given 11/02/17 1526)  gi cocktail (Maalox,Lidocaine,Donnatal) (30 mLs Oral Given 11/02/17 1527)  hydrOXYzine (ATARAX/VISTARIL) tablet 25 mg (25 mg Oral Given 11/02/17 1527)  acetaminophen (TYLENOL) tablet 1,000 mg (1,000 mg Oral Given 11/02/17 1608)     Initial Impression / Assessment and Plan / ED Course   I have reviewed the triage vital signs and the nursing notes.  Pertinent labs & imaging results that were available during my care of the patient were reviewed by me and considered in my medical decision making (see chart for details).     47 year old male with a history of alcohol use disorder, cocaine use disorder, GERD, WPW, and chronic pancreatitis BIB EMS who presents to the emergency department with a chief complaint of epigastric pain.  He  is well-known to the emergency department with 30 visits in the last 6 months.  On exam, patient has reproducible epigastric tenderness.  No signs of peritonitis.  He is hemodynamically stable.  He does not have a surgical abdomen.  Labs are reassuring with the patient's hemoglobin 9.2, and placed from previous 8.4.  Lipase 69, which is around the patient's baseline.  Will give Toradol, Zofran since the patient endorses emesis, GI cocktail, and fluid challenge.  On examination, the patient reports that he had one episode of emesis after drinking some water.  He was given a ginger ale.  On second reexamination, the patient's reports that he has been " vomiting ginger ale."  When I look to the emesis bag, only ginger ale was present.  No episode of emesis.  When asked again, the patient states all I was actually just spitting the ginger ale into the bag.  At this time, I feel that the patient is safe for discharge.  We will give him an Rx for Zofran and recommended outpatient follow-up.  Strict return precautions given.  Patient is hemodynamically stable and in no acute distress.  He is safe for discharge home at this time.  Final Clinical Impressions(s) / ED Diagnoses   Final diagnoses:  Chronic alcoholic gastritis without hemorrhage    ED Discharge Orders        Ordered    ondansetron (ZOFRAN) 4 MG tablet  Every 8 hours PRN     11/02/17 1558       Momina Hunton A, PA-C 11/02/17 1620    Charlesetta Shanks, MD 11/10/17 1836

## 2017-11-02 NOTE — ED Notes (Signed)
Patient stated he was unable to tolerate fluid and he vomited in the bathroom.

## 2017-11-02 NOTE — ED Notes (Signed)
Gave pt bus pass 

## 2017-11-02 NOTE — Discharge Instructions (Signed)
Thank you for allowing me to your care today in the emergency department.  Please continue to take your home medications as prescribed.  Let 1 tablet of Zofran dissolve in your tongue every 8 hours as needed for nausea or vomiting.  Return to the emergency department if you develop dark black or maroon stools, persistent vomiting, high fever, or other new concerning symptoms.

## 2017-11-07 IMAGING — CT CT ABD-PELV W/ CM
2 of 5 series · 16 of 46 positions shown, 18 images · IV contrast (100 ML OMNI 300)
Comparison: CT dated 04/24/2015

CLINICAL DATA: 44-year-old male with left upper quadrant abdominal
pain. History of pancreatitis.

EXAM:
CT ABDOMEN AND PELVIS WITH CONTRAST
TECHNIQUE: Multidetector CT imaging of the abdomen and pelvis was performed
using the standard protocol following bolus administration of
intravenous contrast.
CONTRAST:  100mL OMNIPAQUE IOHEXOL 300 MG/ML  SOLN

[Series 2: abd/pel with · axial · 0.64mm/px · z∈[+1164,+1519]mm · 13 of 81 slices shown, 15 images]
[im 5/81  soft-tissue]
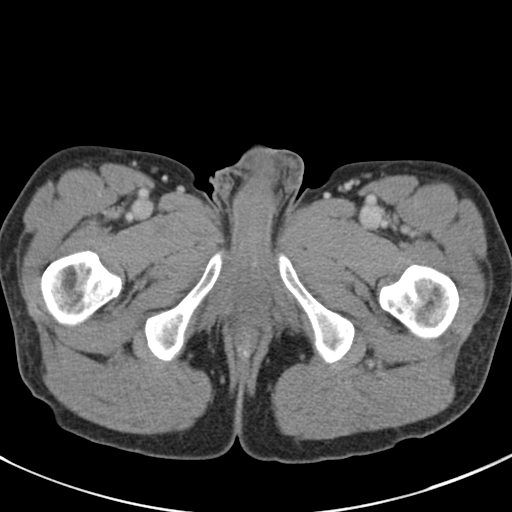
[im 5/81  bone]
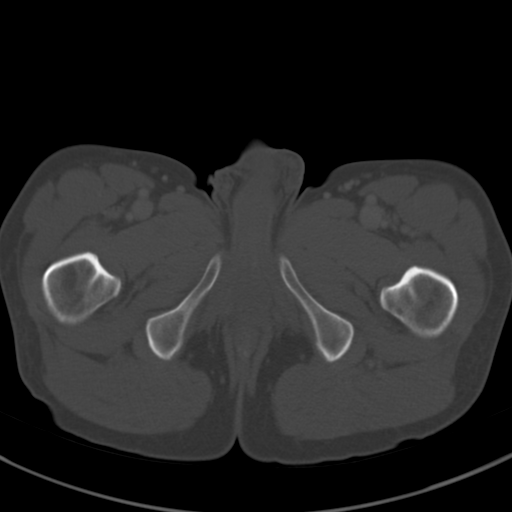
[im 13/81  soft-tissue]
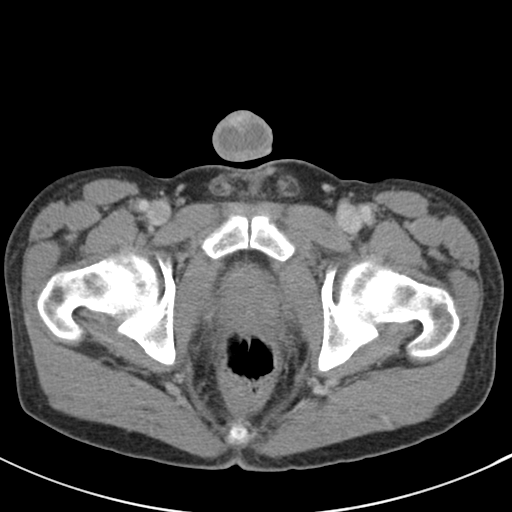
[im 17/81  soft-tissue]
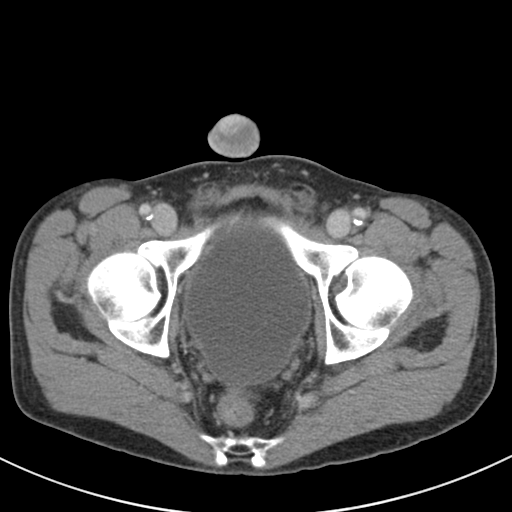
[im 22/81  soft-tissue]
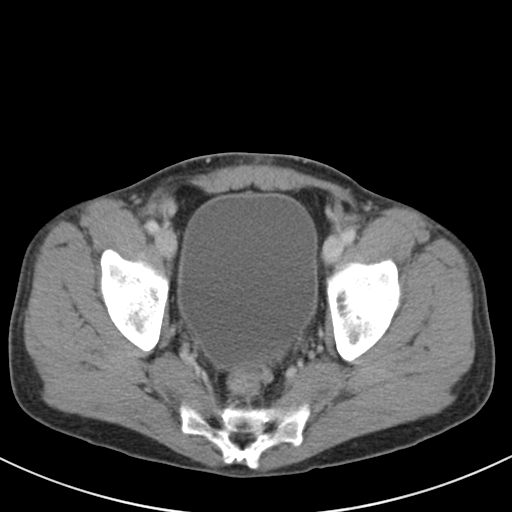
[im 30/81  soft-tissue]
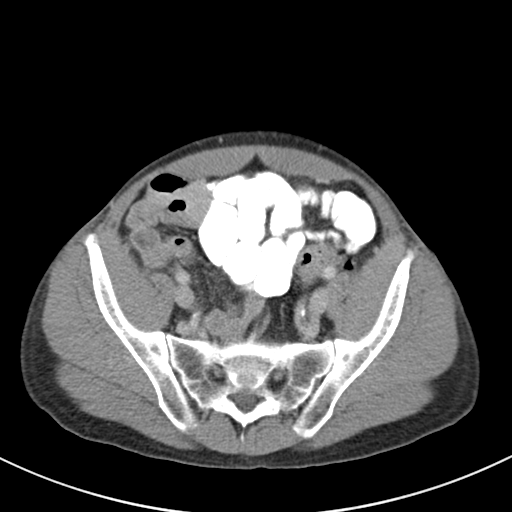
[im 34/81  soft-tissue]
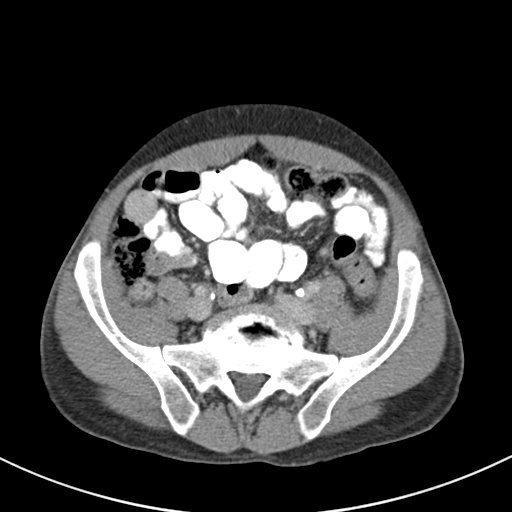
[im 43/81  soft-tissue]
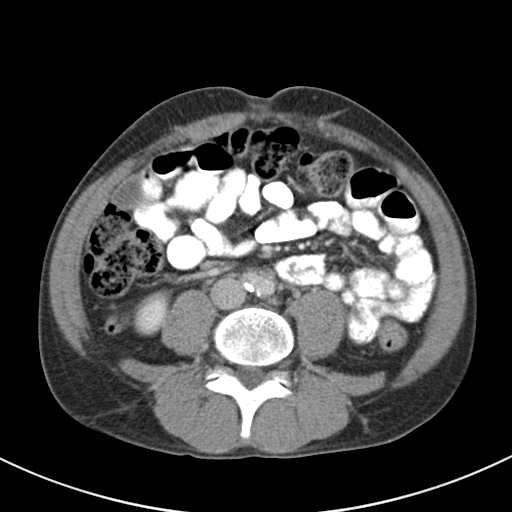
[im 47/81  soft-tissue]
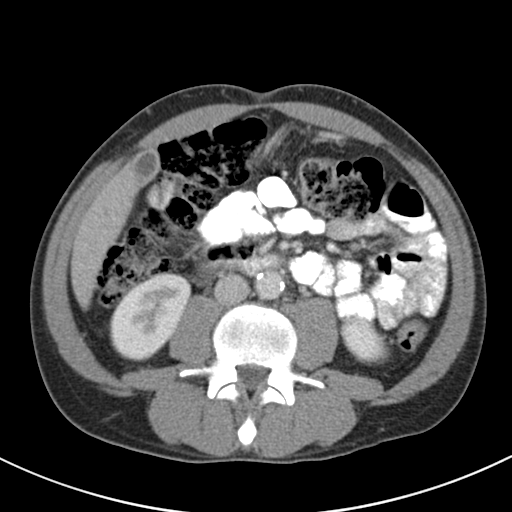
[im 51/81  soft-tissue]
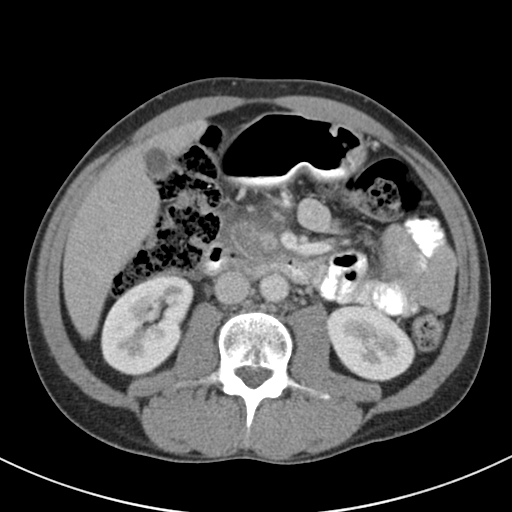
[im 51/81  bone]
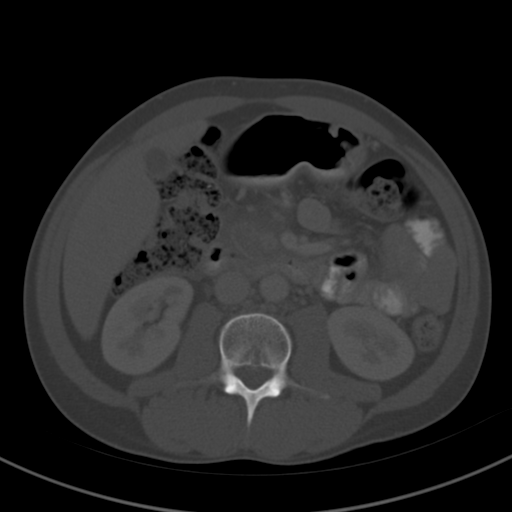
[im 59/81  soft-tissue]
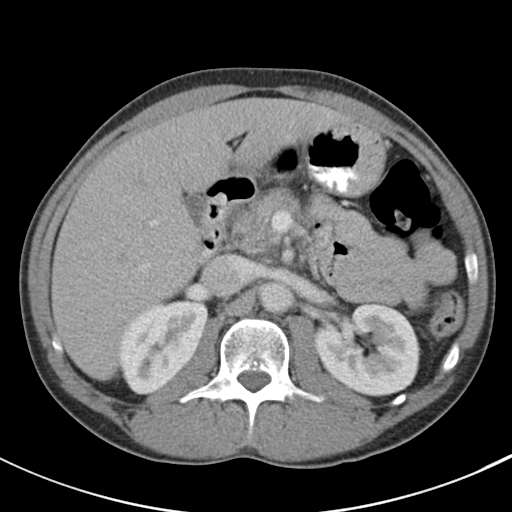
[im 64/81  soft-tissue]
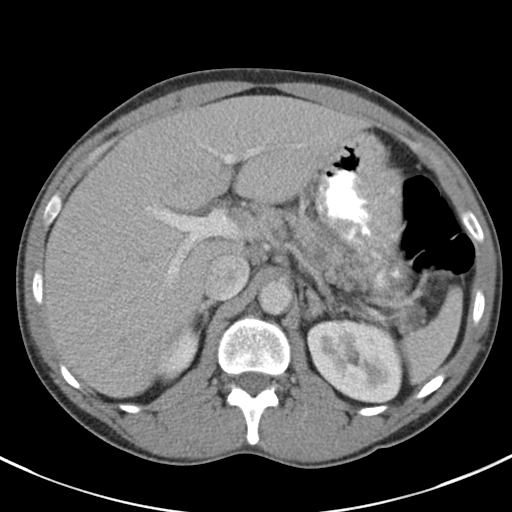
[im 68/81  soft-tissue]
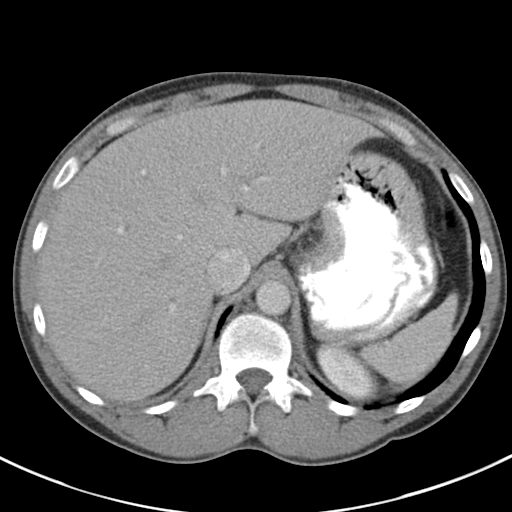
[im 76/81  soft-tissue]
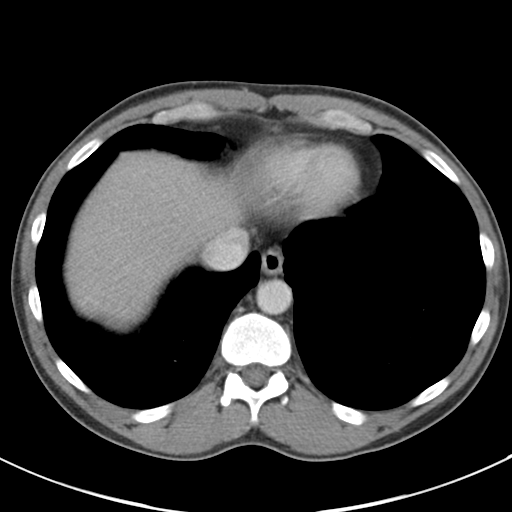

[Series 4: coronal a/|p · coronal · 0.63mm/px · 3 of 85 slices shown]
[im 29/85  soft-tissue]
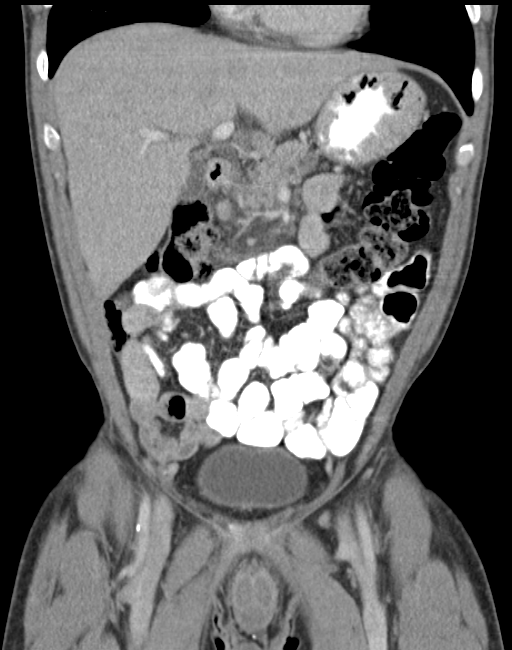
[im 38/85  soft-tissue]
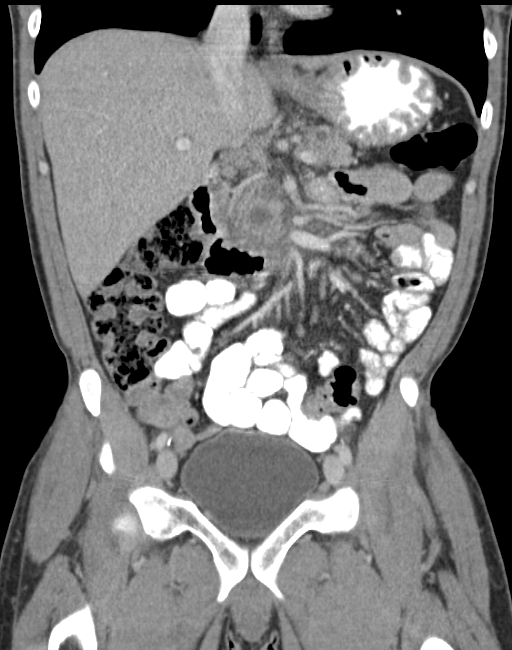
[im 47/85  soft-tissue]
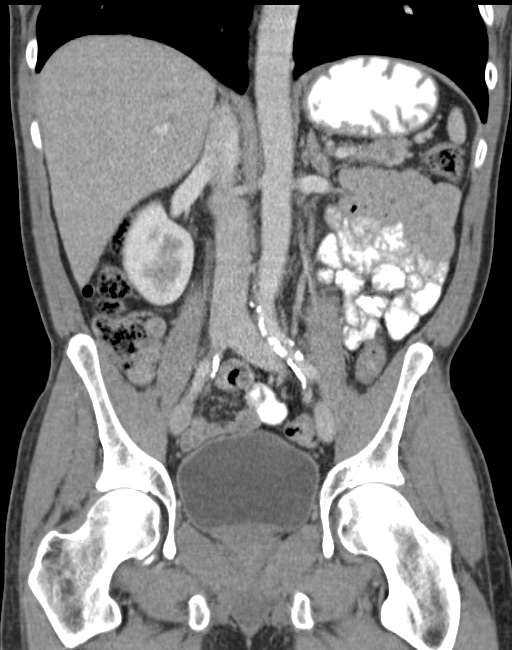

[16 of 46 positions shown; findings below may reference images not displayed]

FINDINGS: The visualized lung bases are clear. No intra-abdominal free air or
free fluid.

There is inflammatory changes of the pancreas with peripancreatic
stranding most prominent involving the head and uncinate process
compatible with known pancreatitis. Multiple pancreatic and
peripancreatic hypodense lesions are compatible with known
pseudocysts. The largest such cyst measures 2.2 x 1.7 cm at the head
of the pancreas (previously measured 1.9 x 1.5 cm). A 1.4 x 1.4 cm
hypodense lesion at the pancreatic tail previously measured 1.6 x
1.6 cm.

The liver, gallbladder, spleen, adrenal glands, kidneys, visualized
ureters, and urinary bladder appear unremarkable. The prostate and
seminal vesicles are grossly unremarkable.

There is apparent thickening of the gastric rugal folds. Correlation
with clinical exam is recommended to evaluate for gastritis.
Moderate stool throughout the colon. There is no evidence of bowel
obstruction. Normal appendix.

Mild aortoiliac atherosclerotic disease. The origins of the celiac
axis, SMA, IMA as well as the origins of the renal arteries are
patent. The SMV, splenic vein, and main portal veins are patent. No
portal venous gas identified. There is a mild periportal edema. No
adenopathy.

Small fat containing umbilical hernia. The abdominal wall soft
tissues appear unremarkable. The osseus structures are intact.
IMPRESSION: Pancreatitis with multiple pseudocysts as described.

Apparent thickening of the rugal folds. Clinical correlation is
recommended to evaluate for gastritis. No bowel obstruction. Normal
appendix.

## 2017-11-08 IMAGING — DX DG CHEST 2V
2 series · 2 of 2 positions shown · non-contrast
Comparison: 03/05/2015

CLINICAL DATA: Shortness of breath and productive cough.

EXAM:
CHEST  2 VIEW

[chest pa]
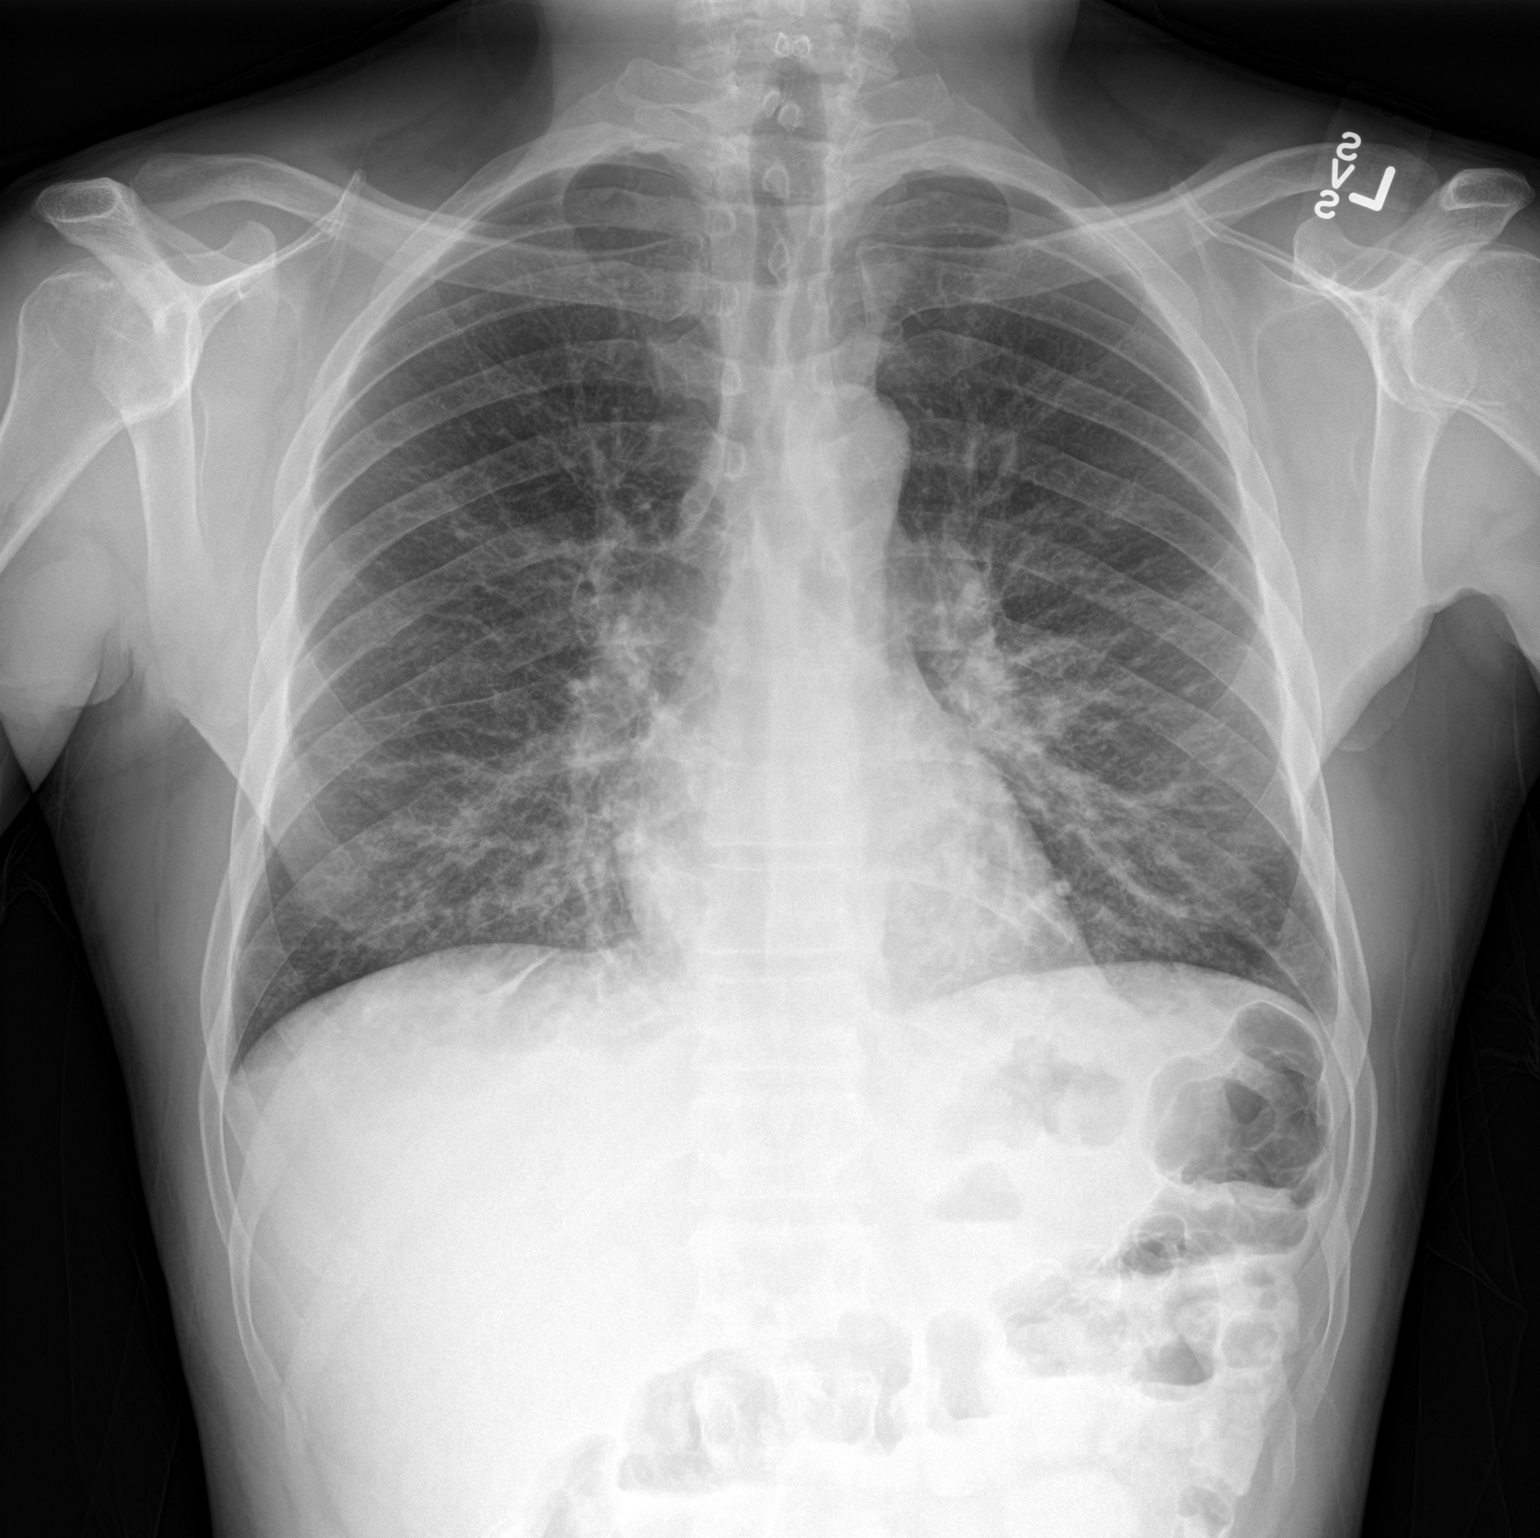

[chest lat]
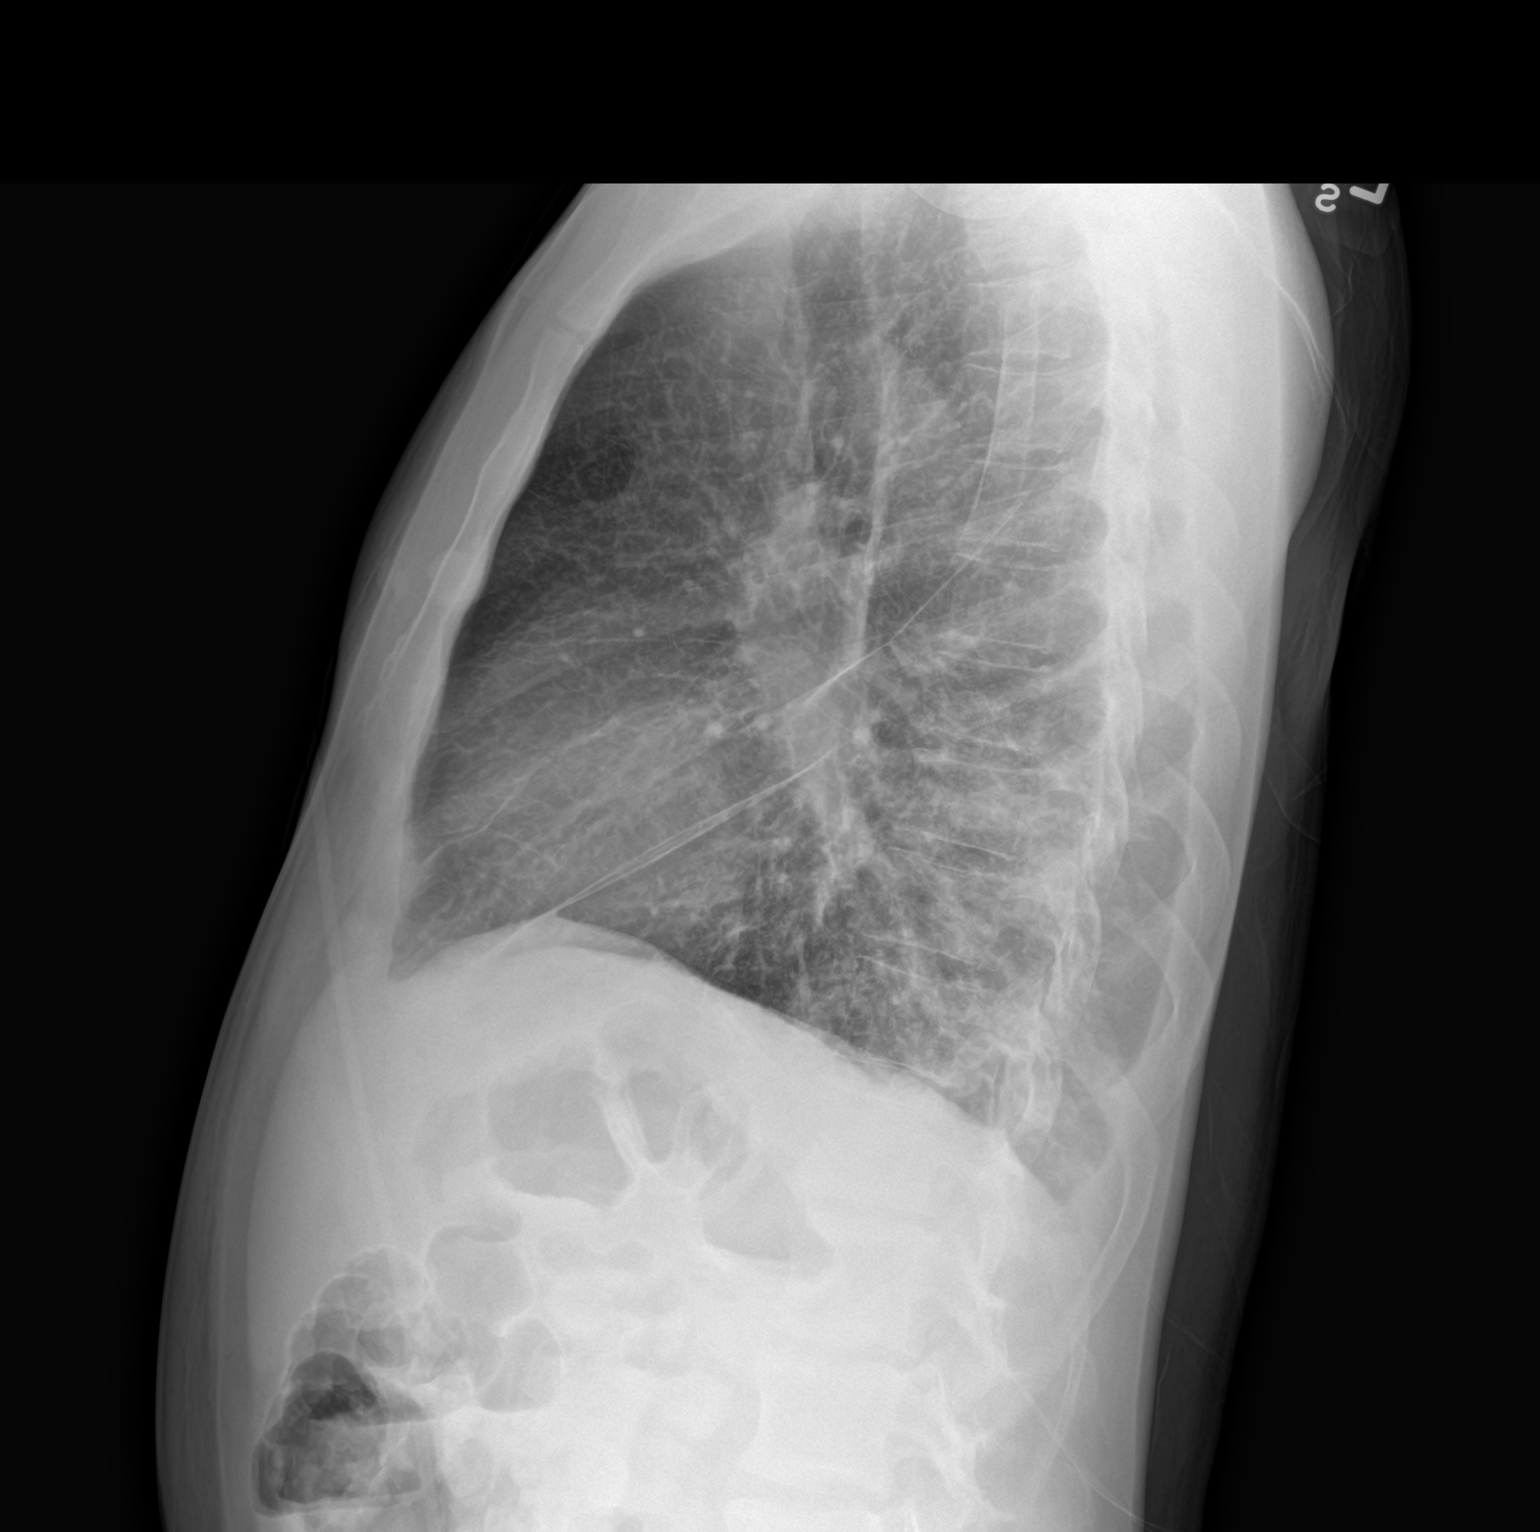

[2 of 2 positions shown; findings below may reference images not displayed]

FINDINGS: Cardiomediastinal silhouette is normal. Mediastinal contours appear
intact.

There has been interval development of alveolar and interstitial
infiltrates within bilateral lower lobes. Coarsening of the
interstitial markings and thickening of the interlobar fissures are
also seen. There is no evidence of pleural effusion or pneumothorax.

Osseous structures are without acute abnormality. Soft tissues are
grossly normal.
IMPRESSION: Coarsening of the interstitial markings in thickening of the
interlobar fissures, suggestive of pulmonary vascular congestion.

Symmetric interstitial and alveolar infiltrates in bilateral lower
lobes, which may be seen due to development of pulmonary edema or
developing airspace consolidation.

## 2017-11-10 IMAGING — CR DG CHEST 2V
2 series · 2 of 2 positions shown · non-contrast
Comparison: 05/19/2015

CLINICAL DATA: Cough.  Pneumonia.  Pancreatitis.

EXAM:
CHEST  2 VIEW

[w chest pa]
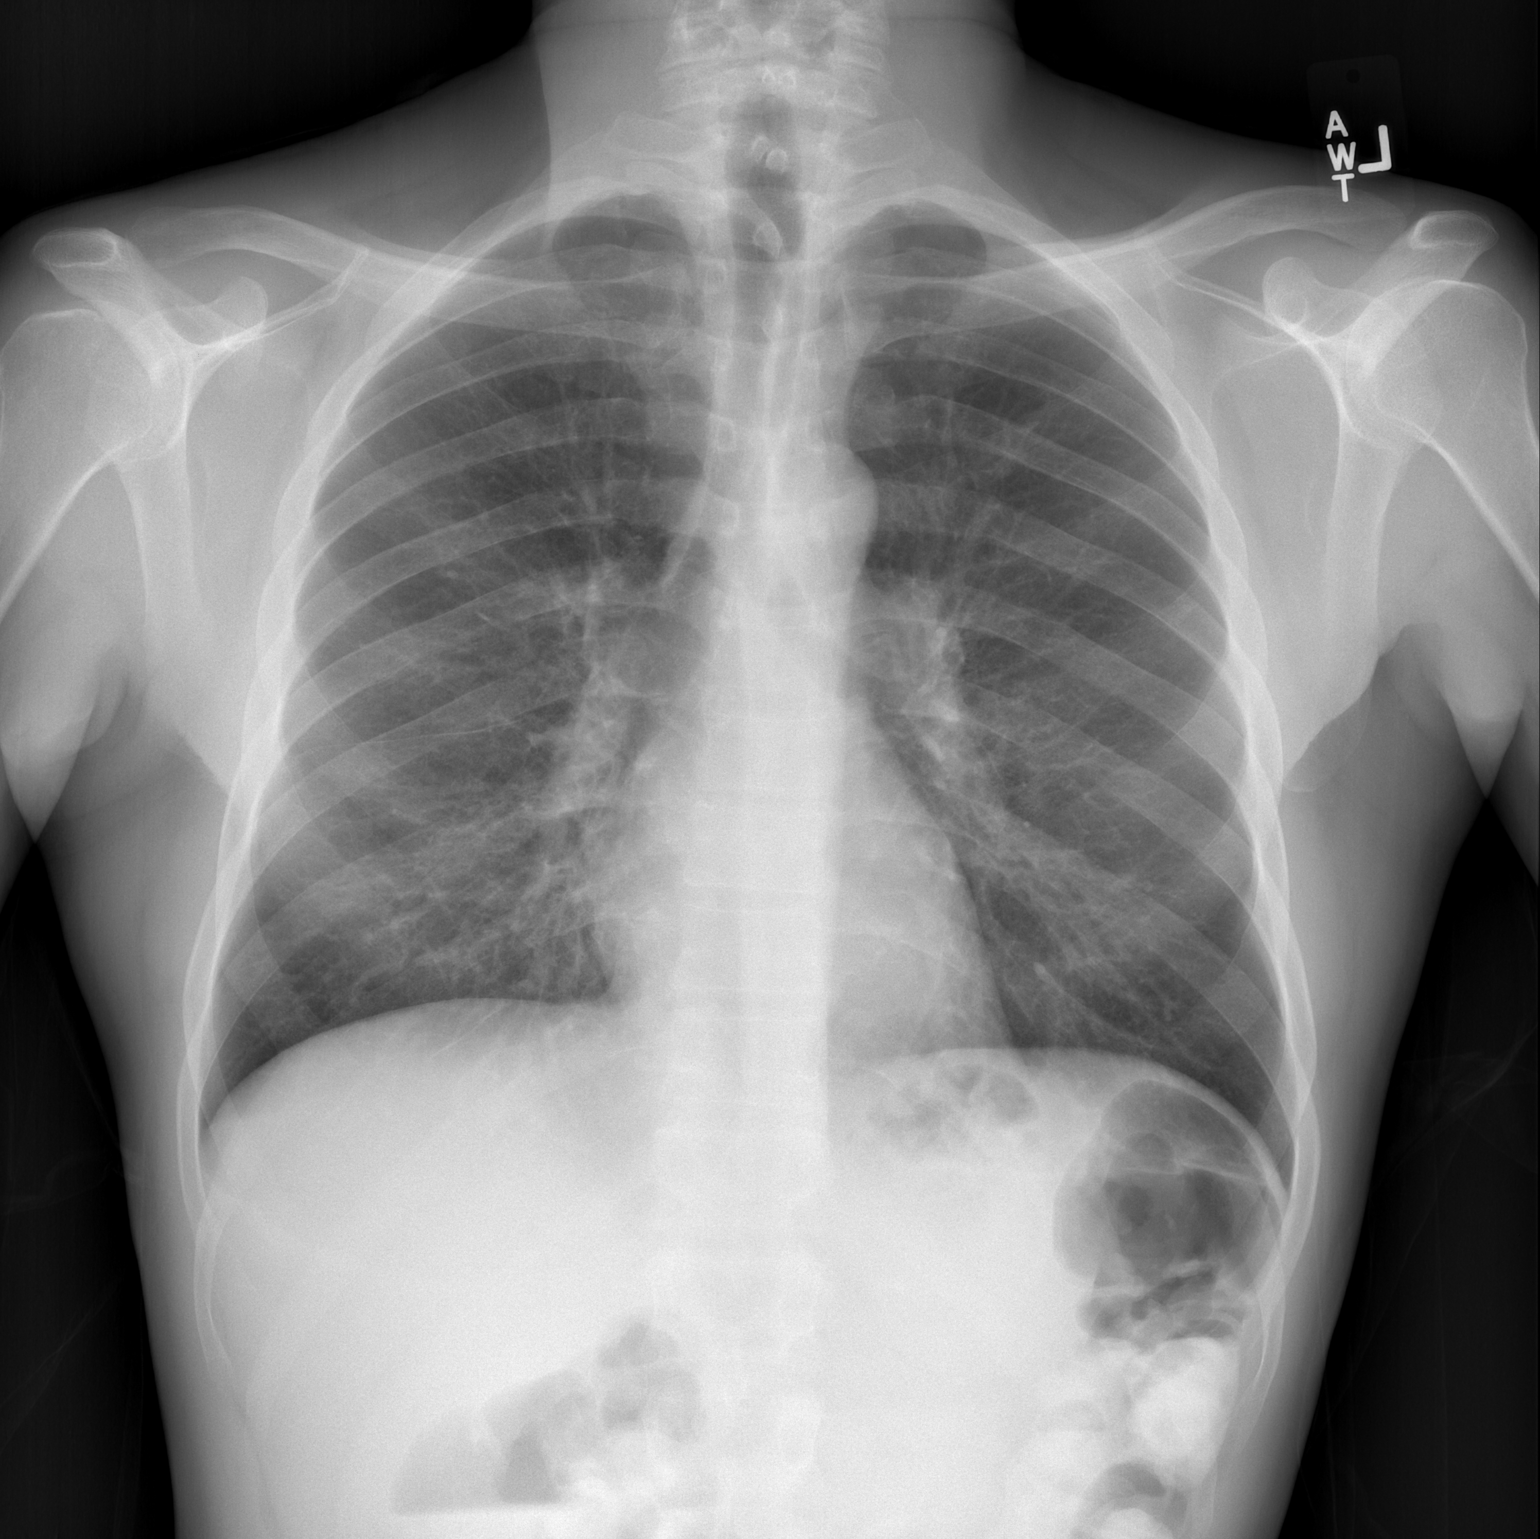

[w chest lat]
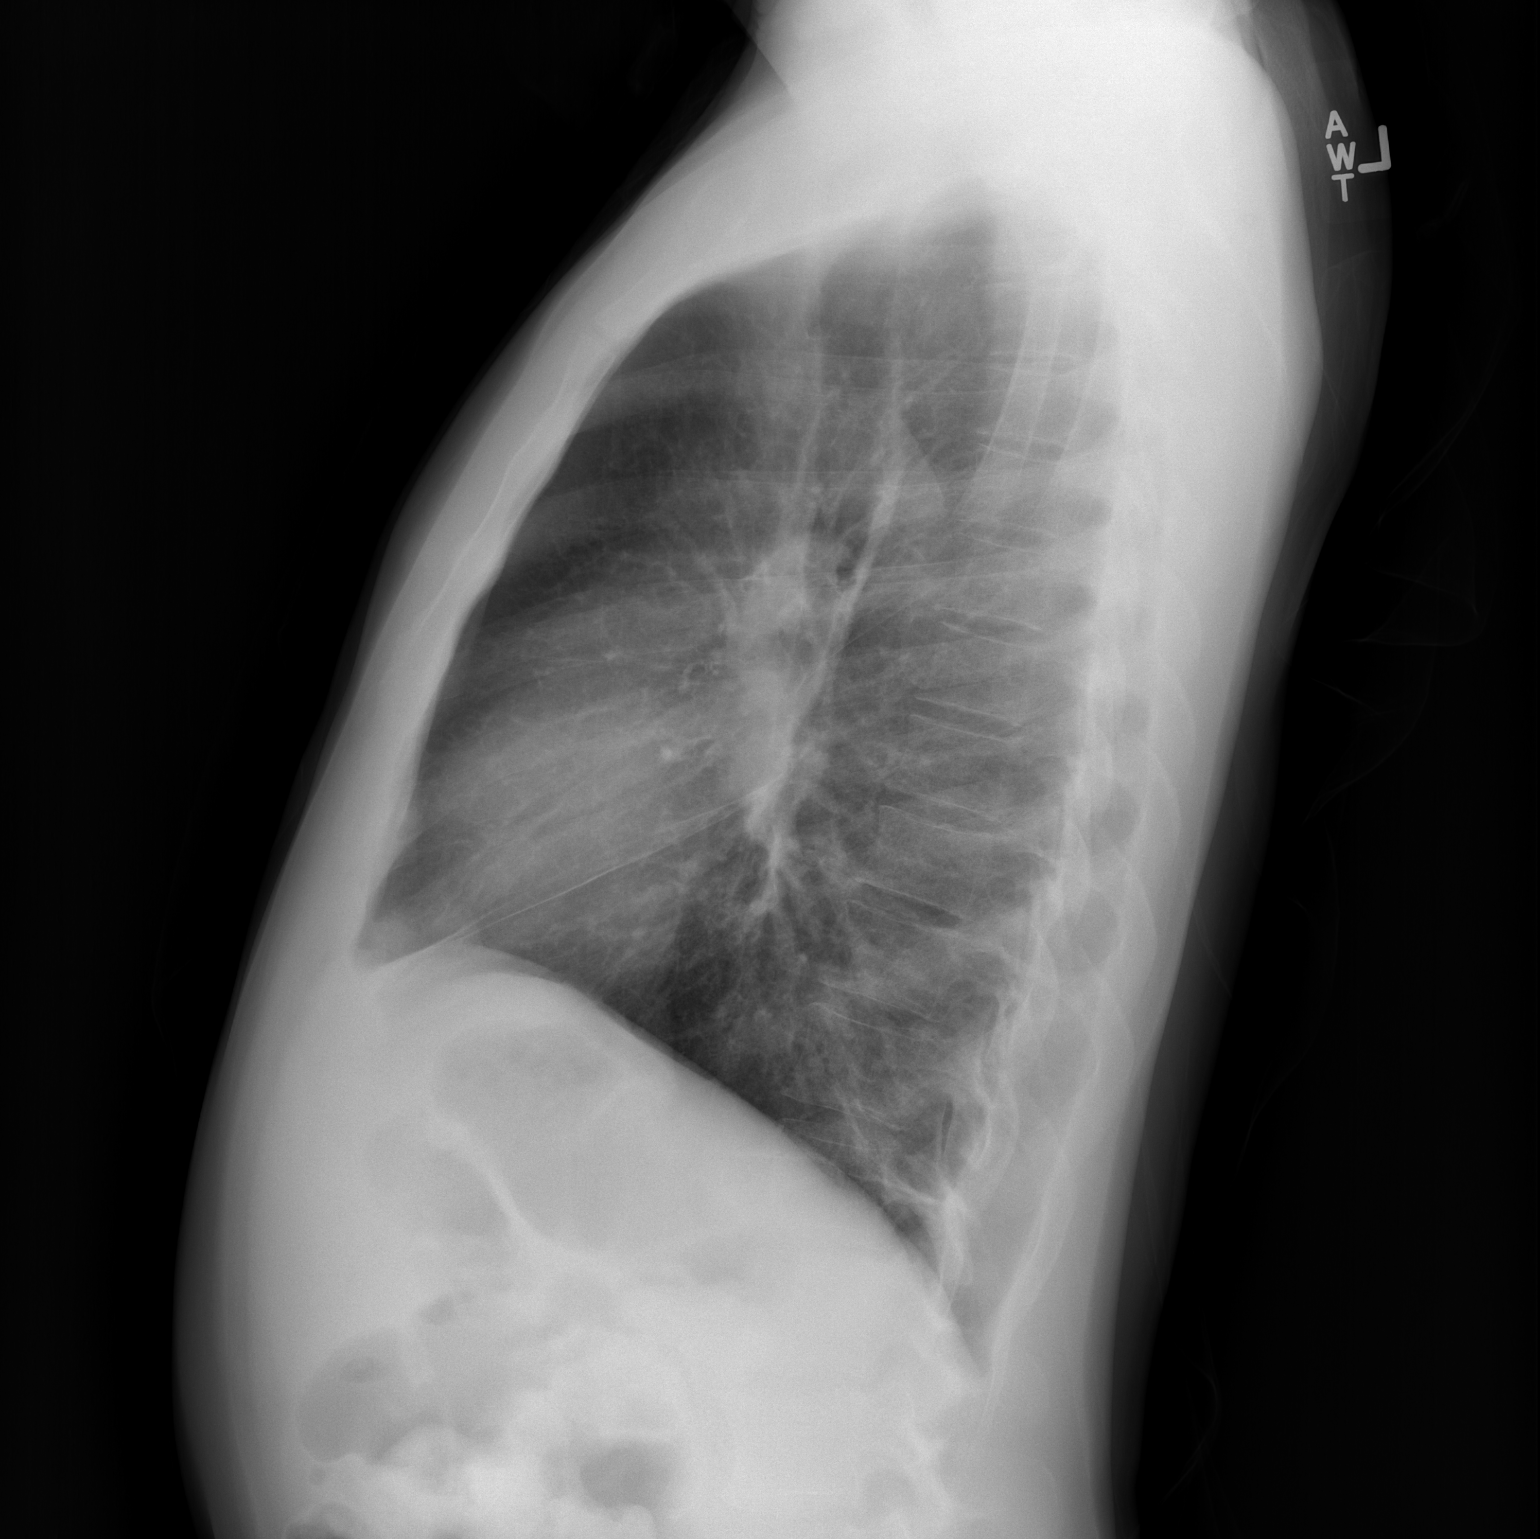

[2 of 2 positions shown; findings below may reference images not displayed]

FINDINGS: Heart size remains normal. Diffuse interstitial infiltrates have
resolved since previous study. No evidence of pulmonary infiltrate
or pleural effusion.
IMPRESSION: Resolution of pulmonary interstitial infiltrates since prior study.
No active disease.

## 2017-11-16 ENCOUNTER — Emergency Department (HOSPITAL_COMMUNITY)
Admission: EM | Admit: 2017-11-16 | Discharge: 2017-11-16 | Disposition: A | Discharge status: Home / Self Care | Payer: Self-pay | Attending: Emergency Medicine | Admitting: Emergency Medicine

## 2017-11-16 DIAGNOSIS — Z79899 Other long term (current) drug therapy: Secondary | ICD-10-CM | POA: Insufficient documentation

## 2017-11-16 DIAGNOSIS — I10 Essential (primary) hypertension: Secondary | ICD-10-CM | POA: Insufficient documentation

## 2017-11-16 DIAGNOSIS — F17210 Nicotine dependence, cigarettes, uncomplicated: Secondary | ICD-10-CM | POA: Insufficient documentation

## 2017-11-16 DIAGNOSIS — G8929 Other chronic pain: Secondary | ICD-10-CM

## 2017-11-16 DIAGNOSIS — R109 Unspecified abdominal pain: Secondary | ICD-10-CM | POA: Insufficient documentation

## 2017-11-16 DIAGNOSIS — R0789 Other chest pain: Secondary | ICD-10-CM | POA: Insufficient documentation

## 2017-11-16 LAB — CBC WITH DIFFERENTIAL/PLATELET
BASOS ABS: 0 10*3/uL (ref 0.0–0.1)
Basophils Relative: 0 %
EOS ABS: 0.3 10*3/uL (ref 0.0–0.7)
Eosinophils Relative: 3 %
HCT: 28.8 % — ABNORMAL LOW (ref 39.0–52.0)
HEMOGLOBIN: 9 g/dL — AB (ref 13.0–17.0)
LYMPHS PCT: 31 %
Lymphs Abs: 2.7 10*3/uL (ref 0.7–4.0)
MCH: 23.9 pg — ABNORMAL LOW (ref 26.0–34.0)
MCHC: 31.3 g/dL (ref 30.0–36.0)
MCV: 76.6 fL — ABNORMAL LOW (ref 78.0–100.0)
Monocytes Absolute: 0.9 10*3/uL (ref 0.1–1.0)
Monocytes Relative: 11 %
NEUTROS PCT: 55 %
Neutro Abs: 4.7 10*3/uL (ref 1.7–7.7)
Platelets: 213 10*3/uL (ref 150–400)
RBC: 3.76 MIL/uL — ABNORMAL LOW (ref 4.22–5.81)
RDW: 21.4 % — ABNORMAL HIGH (ref 11.5–15.5)
WBC: 8.6 10*3/uL (ref 4.0–10.5)

## 2017-11-16 LAB — COMPREHENSIVE METABOLIC PANEL
ALT: 21 U/L (ref 0–44)
ANION GAP: 10 (ref 5–15)
AST: 28 U/L (ref 15–41)
Albumin: 3.8 g/dL (ref 3.5–5.0)
Alkaline Phosphatase: 78 U/L (ref 38–126)
BUN: 7 mg/dL (ref 6–20)
CALCIUM: 9.1 mg/dL (ref 8.9–10.3)
CHLORIDE: 107 mmol/L (ref 98–111)
CO2: 20 mmol/L — AB (ref 22–32)
Creatinine, Ser: 0.82 mg/dL (ref 0.61–1.24)
GFR calc non Af Amer: >60 mL/min (ref >60)
Glucose, Bld: 93 mg/dL (ref 70–99)
Potassium: 3.4 mmol/L — ABNORMAL LOW (ref 3.5–5.1)
Sodium: 137 mmol/L (ref 135–145)
TOTAL PROTEIN: 7.4 g/dL (ref 6.5–8.1)
Total Bilirubin: 0.6 mg/dL (ref 0.3–1.2)

## 2017-11-16 LAB — TROPONIN I: Troponin I: <0.03 ng/mL (ref <0.03)

## 2017-11-16 LAB — CBG MONITORING, ED: GLUCOSE-CAPILLARY: 97 mg/dL (ref 70–99)

## 2017-11-16 LAB — ETHANOL

## 2017-11-16 LAB — LIPASE, BLOOD: LIPASE: 48 U/L (ref 11–51)

## 2017-11-16 MED ORDER — FAMOTIDINE 20 MG PO TABS
40.0000 mg | ORAL_TABLET | Freq: Once | ORAL | Status: AC
  Administered 2017-11-16: 40 mg via ORAL
  Filled 2017-11-16: qty 2

## 2017-11-16 MED ORDER — ACETAMINOPHEN 500 MG PO TABS
1000.0000 mg | ORAL_TABLET | Freq: Once | ORAL | Status: AC
  Administered 2017-11-16: 1000 mg via ORAL
  Filled 2017-11-16: qty 2

## 2017-11-16 MED ORDER — SUCRALFATE 1 G PO TABS
1.0000 g | ORAL_TABLET | Freq: Once | ORAL | Status: AC
  Administered 2017-11-16: 1 g via ORAL
  Filled 2017-11-16: qty 1

## 2017-11-16 MED ORDER — KETOROLAC TROMETHAMINE 15 MG/ML IJ SOLN
30.0000 mg | Freq: Once | INTRAMUSCULAR | Status: DC
  Filled 2017-11-16: qty 2

## 2017-11-16 MED ORDER — FAMOTIDINE 20 MG PO TABS: 20.0000 mg | ORAL_TABLET | Freq: Two times a day (BID) | ORAL | 0 refills | Status: DC

## 2017-11-16 MED ORDER — KETOROLAC TROMETHAMINE 15 MG/ML IJ SOLN
15.0000 mg | Freq: Once | INTRAMUSCULAR | Status: AC
  Administered 2017-11-16: 15 mg via INTRAVENOUS

## 2017-11-16 MED ORDER — LORAZEPAM 2 MG/ML IJ SOLN
1.0000 mg | Freq: Once | INTRAMUSCULAR | Status: AC
  Administered 2017-11-16: 1 mg via INTRAVENOUS
  Filled 2017-11-16: qty 1

## 2017-11-16 MED ORDER — GI COCKTAIL ~~LOC~~
30.0000 mL | Freq: Once | ORAL | Status: AC
  Administered 2017-11-16: 30 mL via ORAL
  Filled 2017-11-16: qty 30

## 2017-11-16 MED ORDER — SUCRALFATE 1 G PO TABS: 1.0000 g | ORAL_TABLET | Freq: Four times a day (QID) | ORAL | 0 refills | Status: DC

## 2017-11-16 NOTE — Discharge Instructions (Addendum)
Please make an appointment with [facility] to establish care with primary doctor

## 2017-11-16 NOTE — ED Triage Notes (Addendum)
Pt arrived via [facility] from home c/o chest and abd pain since 16:30 today. Pt denies SOB or sweating at this time. Pt is alert and oriented x4. Pt

## 2017-11-16 NOTE — ED Provider Notes (Signed)
[facility] EMERGENCY DEPARTMENT Provider Note   CSN: [id number] Arrival date & time: 11/16/17  1742     History   Chief Complaint Chief Complaint  Patient presents with  . Chest Pain  . Abdominal Pain    HPI [name] is a 47 y.o. male who presents with chest and abdominal pain. PMH significant for alcoholism, polysubstance abuse (cocaine, THC), GERD, WPW, and chronic pancreatitis with frequent ED visits and care plan. He was last seen in the ED on 6/27 for his chronic abdominal pain.  Patient states that he has not drink any alcohol since Monday.  Today he ate a burger and fries and had an acute onset of nausea and vomiting.  This caused a flare of his chest pain and abdominal pain.  The pain feels similar to prior episodes of chest pain and abdominal pain.  He denies fever, chills, shortness of breath, cough.  He is having some diarrhea.  He reports that he has made an appointment with cardiology and GI but has to pay the co-pay first.  He denies any prior abdominal surgeries.     HPI  Past Medical History:  Diagnosis Date  . Alcoholism /alcohol abuse ([place])   . Anemia   . Anxiety   . Arthritis    "knees; arms; elbows" (03/26/2015)  . Asthma   . Bipolar disorder ([place])   . Chronic bronchitis ([place])   . Chronic lower back pain   . Chronic pancreatitis ([place])   . Cocaine abuse ([place])   . Depression   . Family history of adverse reaction to anesthesia    "grandmother gets confused"  . Femoral condyle fracture ([place]) 03/08/2014   left medial/notes 03/09/2014  . GERD (gastroesophageal reflux disease)   . H/O hiatal hernia   . H/O suicide attempt 10/2012  . Heart murmur    "when he was little" (03/06/2013)  . High cholesterol   . History of blood transfusion 10/2012   "when I tried to commit suicide"  . History of stomach ulcers   . Hypertension   . Marijuana abuse, continuous   . Migraine    "a few times/year" (03/26/2015)  . Pneumonia 1990's  X 3  . PTSD (post-traumatic stress disorder)   . Shortness of breath    "can happen at anytime" (03/06/2013)  . Sickle cell trait ([place])   . WPW (Wolff-Parkinson-White syndrome)    [name] 03/06/2013    Patient Active Problem List   Diagnosis Date Noted  . Acute pancreatitis 09/28/2017  . Abdominal pain 05/27/2017  . Hematemesis 05/27/2017  . Tachycardia 03/18/2017  . Diarrhea 03/18/2017  . Acute on chronic pancreatitis ([place]) 12/17/2016  . Intractable nausea and vomiting 12/05/2016  . Verbally abusive behavior 12/05/2016  . Normocytic anemia 12/05/2016  . Alcohol use disorder, severe, dependence ([place]) 07/25/2016  . Cocaine use disorder, severe, dependence ([name]) 07/25/2016  . 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 Leukocytosis   . Hospital acquired PNA 05/20/2015  . Pseudocyst of pancreas 05/18/2015  . Polysubstance abuse (tobacco, cocaine, THC, and ETOH) 03/26/2015  . Alcohol-induced chronic pancreatitis ([place])   . Benign essential HTN 02/06/2014  . Alcohol-induced acute pancreatitis 11/28/2013  . Pancreatic pseudocyst/cyst 11/25/2013  . Severe protein-calorie malnutrition ([place]) 10/10/2013  . Suicide attempt ([place]) 10/08/2013  . 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 TOBACCO ABUSE 03/23/2007    Past Surgical History:  Procedure Laterality Date  . CARDIAC CATHETERIZATION    .  EYE SURGERY Left 1990's   "result of trauma"   . FACIAL FRACTURE SURGERY Left 1990's   "result of trauma"   . FRACTURE SURGERY    . HERNIA REPAIR    . LEFT HEART CATHETERIZATION WITH CORONARY ANGIOGRAM Right 03/07/2013   Procedure: LEFT HEART CATHETERIZATION WITH CORONARY ANGIOGRAM;  Surgeon: [name], MD;  Location: [facility] CATH LAB;  Service: Cardiovascular;  Laterality: Right;  . UMBILICAL HERNIA REPAIR          Home Medications    Prior to Admission medications   Medication Sig Start Date End Date Taking?  Authorizing Provider  albuterol (PROVENTIL HFA;VENTOLIN HFA) 108 (90 Base) MCG/ACT inhaler Inhale 2 puffs into the lungs every 6 (six) hours as needed for wheezing or shortness of breath. 08/29/17  Yes Clent Demark, PA-C  famotidine (PEPCID) 20 MG tablet Take 20 mg by mouth 2 (two) times daily.   Yes [provider]  hydrALAZINE (APRESOLINE) 25 MG tablet Take 1 tablet (25 mg total) by mouth every 8 (eight) hours. Patient taking differently: Take 25 mg by mouth at bedtime.  08/29/17  Yes Clent Demark, PA-C  hydrOXYzine (VISTARIL) 50 MG capsule Take 50 mg by mouth 3 (three) times daily as needed for anxiety.    Yes [provider]  lipase/protease/amylase (CREON) 12000 units CPEP capsule Take 1 capsule (12,000 Units total) by mouth 3 (three) times daily with meals. For pancreatitis 10/01/17  Yes Aline August, MD  loratadine (CLARITIN) 10 MG tablet Take 1 tablet (10 mg total) by mouth daily as needed for allergies or rhinitis. (May purchase from over the counter): For allergies 10/01/17  Yes Aline August, MD  metoprolol tartrate (LOPRESSOR) 25 MG tablet Take 1 tablet (25 mg total) by mouth 2 (two) times daily. For high blood pressure 08/29/17  Yes Clent Demark, PA-C  Multiple Vitamin (MULTIVITAMIN WITH MINERALS) TABS tablet Take 1 tablet by mouth daily. 09/06/17  Yes Bonnell Public, MD  ondansetron (ZOFRAN) 4 MG tablet Take 1 tablet (4 mg total) by mouth every 8 (eight) hours as needed for nausea or vomiting. 11/02/17  Yes McDonald, Mia A, PA-C  pantoprazole (PROTONIX) 40 MG tablet Take 1 tablet (40 mg total) by mouth daily. 08/29/17  Yes Clent Demark, PA-C  sertraline (ZOLOFT) 100 MG tablet Take 1 tablet (100 mg total) by mouth daily. 08/29/17  Yes Clent Demark, PA-C  sucralfate (CARAFATE) 1 g tablet Take 1 tablet by mouth 4 (four) times daily. 09/19/17  Yes [provider]  omeprazole (PRILOSEC) 20 MG capsule Take 1 capsule (20 mg total) daily by  mouth. 03/22/17 06/21/17  Varney Biles, MD    Family History Family History  Problem Relation Age of Onset  . Hypertension Mother   . Cirrhosis Mother   . Alcoholism Mother   . Hypertension Father   . Melanoma Father   . Hypertension Other   . Coronary artery disease Other     Social History Social History   Tobacco Use  . Smoking status: Current Every Day Smoker    Packs/day: 1.00    Years: 33.00    Pack years: 33.00    Types: Cigarettes, E-cigarettes  . Smokeless tobacco: Never Used  Substance Use Topics  . Alcohol use: Yes  . Drug use: Yes    Types: Marijuana, Cocaine    Comment: daily marijuana use; last cocaine use about 3 months ago     Allergies   Robaxin [methocarbamol]; Shellfish-derived products; Trazodone;  Trazodone and nefazodone; Contrast media [iodinated diagnostic agents]; and Reglan [metoclopramide]   Review of Systems Review of Systems  Constitutional: Negative for chills and fever.  Respiratory: Negative for cough and shortness of breath.   Cardiovascular: Positive for chest pain.  Gastrointestinal: Positive for abdominal pain, diarrhea, nausea and vomiting.  Genitourinary: Negative for dysuria.  All other systems reviewed and are negative.    Physical Exam Updated Vital Signs BP 122/85 (BP Location: Left Arm)   Pulse 99   Temp 98.7 F (37.1 C) (Oral)   Resp 15   SpO2 98%   Physical Exam  Constitutional: He is oriented to person, place, and time. He appears well-developed and well-nourished. No distress.  Calm and cooperative  HENT:  Head: Normocephalic and atraumatic.  Eyes: Pupils are equal, round, and reactive to light. Conjunctivae are normal. Right eye exhibits no discharge. Left eye exhibits no discharge. No scleral icterus.  Neck: Normal range of motion.  Cardiovascular: Normal rate and regular rhythm.  Pulmonary/Chest: Effort normal and breath sounds normal. No respiratory distress.  Abdominal: Soft. Bowel sounds are  normal. He exhibits no distension and no mass. There is tenderness (Minimally tender in the epigastric and left upper quadrant). There is no rebound and no guarding. No hernia.  Neurological: He is alert and oriented to person, place, and time.  Skin: Skin is warm and dry.  Psychiatric: He has a normal mood and affect. His behavior is normal.  Nursing note and vitals reviewed.    ED Treatments / Results  Labs (all labs ordered are listed, but only abnormal results are displayed) Labs Reviewed  COMPREHENSIVE METABOLIC PANEL - Abnormal; Notable for the following components:      Result Value   Potassium 3.4 (*)    CO2 20 (*)    All other components within normal limits  CBC WITH DIFFERENTIAL/PLATELET - Abnormal; Notable for the following components:   RBC 3.76 (*)    Hemoglobin 9.0 (*)    HCT 28.8 (*)    MCV 76.6 (*)    MCH 23.9 (*)    RDW 21.4 (*)    All other components within normal limits  LIPASE, BLOOD  ETHANOL  TROPONIN I  CBG MONITORING, ED    EKG EKG Interpretation  Date/Time:  Thursday November 16 2017 18:00:31 EDT Ventricular Rate:  81 PR Interval:    QRS Duration: 84 QT Interval:  311 QTC Calculation: 361 R Axis:   80 Text Interpretation:  Sinus rhythm Borderline short PR interval Biatrial enlargement Borderline repolarization abnormality Baseline wander in lead(s) V6 No significant change since last tracing Confirmed by Deno Etienne [phone]) on 11/16/2017 6:39:48 PM   Radiology No results found.  Procedures Procedures (including critical care time)  Medications Ordered in ED Medications  gi cocktail (Maalox,Lidocaine,Donnatal) (30 mLs Oral Given 11/16/17 1920)  famotidine (PEPCID) tablet 40 mg (40 mg Oral Given 11/16/17 1920)  sucralfate (CARAFATE) tablet 1 g (1 g Oral Given 11/16/17 1920)  ketorolac (TORADOL) 15 MG/ML injection 15 mg (15 mg Intravenous Given 11/16/17 1924)  LORazepam (ATIVAN) injection 1 mg (1 mg Intravenous Given 11/16/17 2017)  acetaminophen  (TYLENOL) tablet 1,000 mg (1,000 mg Oral Given 11/16/17 2208)     Initial Impression / Assessment and Plan / ED Course  I have reviewed the triage vital signs and the nursing notes.  Pertinent labs & imaging results that were available during my care of the patient were reviewed by me and considered in my medical decision making (see chart  for details).  47 year old male presents with exacerbation of his chronic chest and abdominal pain after eating a hamburger and then vomiting.  His EKG is sinus rhythm and unchanged.  He states his pain feels similar to pain in the past therefore will not obtain a chest x-ray since he had multiple imaging studies recently.  On exam he has mild epigastric and left upper quadrant tenderness.  Patient was advised of his care plan and he verbalizes understanding of this.  He was given multiple symptomatic medicines and tolerated p.o.  He was discharged in stable condition.  Referral to [facility] was given.  Final Clinical Impressions(s) / ED Diagnoses   Final diagnoses:  Atypical chest pain  Chronic abdominal pain    ED Discharge Orders    None       Recardo Evangelist, PA-C 11/16/17 [place], [place], DO 11/17/17 1211

## 2017-11-17 ENCOUNTER — Other Ambulatory Visit: Payer: Self-pay

## 2017-11-17 ENCOUNTER — Emergency Department (HOSPITAL_COMMUNITY)
Admission: EM | Admit: 2017-11-17 | Discharge: 2017-11-17 | Disposition: A | Discharge status: Home / Self Care | Payer: Self-pay | Attending: Emergency Medicine | Admitting: Emergency Medicine

## 2017-11-17 ENCOUNTER — Encounter (HOSPITAL_COMMUNITY): Payer: Self-pay | Admitting: *Deleted

## 2017-11-17 DIAGNOSIS — R1013 Epigastric pain: Secondary | ICD-10-CM | POA: Insufficient documentation

## 2017-11-17 DIAGNOSIS — I10 Essential (primary) hypertension: Secondary | ICD-10-CM | POA: Insufficient documentation

## 2017-11-17 DIAGNOSIS — J45909 Unspecified asthma, uncomplicated: Secondary | ICD-10-CM | POA: Insufficient documentation

## 2017-11-17 DIAGNOSIS — F17210 Nicotine dependence, cigarettes, uncomplicated: Secondary | ICD-10-CM | POA: Insufficient documentation

## 2017-11-17 DIAGNOSIS — R112 Nausea with vomiting, unspecified: Secondary | ICD-10-CM | POA: Insufficient documentation

## 2017-11-17 DIAGNOSIS — Z79899 Other long term (current) drug therapy: Secondary | ICD-10-CM | POA: Insufficient documentation

## 2017-11-17 LAB — CBC
HEMATOCRIT: 31.9 % — AB (ref 39.0–52.0)
HEMOGLOBIN: 9.8 g/dL — AB (ref 13.0–17.0)
MCH: 23.6 pg — ABNORMAL LOW (ref 26.0–34.0)
MCHC: 30.7 g/dL (ref 30.0–36.0)
MCV: 76.9 fL — ABNORMAL LOW (ref 78.0–100.0)
Platelets: 236 10*3/uL (ref 150–400)
RBC: 4.15 MIL/uL — ABNORMAL LOW (ref 4.22–5.81)
RDW: 22.1 % — AB (ref 11.5–15.5)
WBC: 9.6 10*3/uL (ref 4.0–10.5)

## 2017-11-17 LAB — LIPASE, BLOOD: Lipase: 55 U/L — ABNORMAL HIGH (ref 11–51)

## 2017-11-17 LAB — COMPREHENSIVE METABOLIC PANEL
ALK PHOS: 77 U/L (ref 38–126)
ALT: 24 U/L (ref 0–44)
ANION GAP: 10 (ref 5–15)
AST: 30 U/L (ref 15–41)
Albumin: 4 g/dL (ref 3.5–5.0)
BILIRUBIN TOTAL: 1 mg/dL (ref 0.3–1.2)
BUN: 9 mg/dL (ref 6–20)
CO2: 24 mmol/L (ref 22–32)
Calcium: 11 mg/dL — ABNORMAL HIGH (ref 8.9–10.3)
Chloride: 102 mmol/L (ref 98–111)
Creatinine, Ser: 0.86 mg/dL (ref 0.61–1.24)
GFR calc Af Amer: >60 mL/min (ref >60)
GLUCOSE: 121 mg/dL — AB (ref 70–99)
POTASSIUM: 3.7 mmol/L (ref 3.5–5.1)
Sodium: 136 mmol/L (ref 135–145)
TOTAL PROTEIN: 7.7 g/dL (ref 6.5–8.1)

## 2017-11-17 LAB — ACETAMINOPHEN LEVEL: ACETAMINOPHEN (TYLENOL), SERUM: 23 ug/mL (ref 10–30)

## 2017-11-17 MED ORDER — KETOROLAC TROMETHAMINE 30 MG/ML IJ SOLN
30.0000 mg | Freq: Once | INTRAMUSCULAR | Status: AC
  Administered 2017-11-17: 30 mg via INTRAVENOUS
  Filled 2017-11-17: qty 1

## 2017-11-17 MED ORDER — ALUM & MAG HYDROXIDE-SIMETH 200-200-20 MG/5ML PO SUSP
15.0000 mL | Freq: Once | ORAL | Status: AC
  Administered 2017-11-17: 15 mL via ORAL
  Filled 2017-11-17: qty 30

## 2017-11-17 MED ORDER — LORAZEPAM 1 MG PO TABS
1.0000 mg | ORAL_TABLET | Freq: Once | ORAL | Status: AC
  Administered 2017-11-17: 1 mg via ORAL
  Filled 2017-11-17: qty 1

## 2017-11-17 MED ORDER — ONDANSETRON HCL 4 MG/2ML IJ SOLN
4.0000 mg | Freq: Once | INTRAMUSCULAR | Status: AC
  Administered 2017-11-17: 4 mg via INTRAVENOUS
  Filled 2017-11-17: qty 2

## 2017-11-17 MED ORDER — PROMETHAZINE HCL 25 MG/ML IJ SOLN
12.5000 mg | Freq: Once | INTRAMUSCULAR | Status: AC
  Administered 2017-11-17: 12.5 mg via INTRAVENOUS
  Filled 2017-11-17: qty 1

## 2017-11-17 MED ORDER — GI COCKTAIL ~~LOC~~
30.0000 mL | Freq: Once | ORAL | Status: AC
  Administered 2017-11-17: 30 mL via ORAL
  Filled 2017-11-17: qty 30

## 2017-11-17 MED ORDER — SUCRALFATE 1 G PO TABS
1.0000 g | ORAL_TABLET | Freq: Once | ORAL | Status: AC
  Administered 2017-11-17: 1 g via ORAL
  Filled 2017-11-17: qty 1

## 2017-11-17 MED ORDER — FAMOTIDINE IN NACL 20-0.9 MG/50ML-% IV SOLN
20.0000 mg | Freq: Once | INTRAVENOUS | Status: AC
  Administered 2017-11-17: 20 mg via INTRAVENOUS
  Filled 2017-11-17: qty 50

## 2017-11-17 MED ORDER — SODIUM CHLORIDE 0.9 % IV BOLUS
1000.0000 mL | Freq: Once | INTRAVENOUS | Status: AC
  Administered 2017-11-17: 1000 mL via INTRAVENOUS

## 2017-11-17 MED ORDER — PROMETHAZINE HCL 25 MG/ML IJ SOLN: 12.5000 mg | Freq: Once | INTRAMUSCULAR | Status: DC

## 2017-11-17 NOTE — ED Provider Notes (Signed)
[facility]ERG[facility] DEPARTMENT Provider Note   CSN: [id number] Arrival date & time: 11/17/17  1759     History   Chief Complaint Chief Complaint  Patient presents with  . Abdominal Pain    HPI [name] is a 47 y.o. male.  HPI [name] is a 47 y.o. male with history of alcohol abuse, anxiety, bipolar disorder, COPD, polysubstance abuse, chronic abdominal pain, chronic pancreatitis, presents to emergency department with complaint of abdominal pain, nausea, vomiting.  Patient states he was here just 3 days ago, states he felt better but when he got home he started to have nausea vomiting again.  He states he is having severe epigastric pain.  He states he is unable to keep anything down, but then states that he has been self-medicating by drinking beer.  He is telling me that he took 8 tablets of Tylenol all day today, to help with his pain, but it has not helped.  He denies any fever or chills.  No blood in his stool or emesis.  He has had normal bowel movements.  Past Medical History:  Diagnosis Date  . Alcoholism /alcohol abuse ([place])   . Anemia   . Anxiety   . Arthritis    "knees; arms; elbows" (03/26/2015)  . Asthma   . Bipolar disorder ([place])   . Chronic bronchitis ([place])   . Chronic lower back pain   . Chronic pancreatitis ([place])   . Cocaine abuse ([place])   . Depression   . Family history of adverse reaction to anesthesia    "grandmother gets confused"  . Femoral condyle fracture ([place]) 03/08/2014   left medial/notes 03/09/2014  . GERD (gastroesophageal reflux disease)   . H/O hiatal hernia   . H/O suicide attempt 10/2012  . Heart murmur    "when he was little" (03/06/2013)  . High cholesterol   . History of blood transfusion 10/2012   "when I tried to commit suicide"  . History of stomach ulcers   . Hypertension   . Marijuana abuse, continuous   . Migraine    "a few times/year" (03/26/2015)  . Pneumonia 1990's X 3  .  PTSD (post-traumatic stress disorder)   . Shortness of breath    "can happen at anytime" (03/06/2013)  . Sickle cell trait ([place])   . WPW (Wolff-Parkinson-White syndrome)    [name] 03/06/2013    Patient Active Problem List   Diagnosis Date Noted  . Acute pancreatitis 09/28/2017  . Abdominal pain 05/27/2017  . Hematemesis 05/27/2017  . Tachycardia 03/18/2017  . Diarrhea 03/18/2017  . Acute on chronic pancreatitis ([name]) 12/17/2016  . Intractable nausea and vomiting 12/05/2016  . Verbally abusive behavior 12/05/2016  . Normocytic anemia 12/05/2016  . Alcohol use disorder, severe, dependence ([place]) 07/25/2016  . Cocaine use disorder, severe, dependence ([place]) 07/25/2016  . Major depressive disorder, recurrent severe without psychotic features ([place]) 07/20/2016  . Leukocytosis   . Hospital acquired PNA 05/20/2015  . Pseudocyst of pancreas 05/18/2015  . Polysubstance abuse (tobacco, cocaine, THC, and ETOH) 03/26/2015  . Alcohol-induced chronic pancreatitis ([place])   . Benign essential HTN 02/06/2014  . Alcohol-induced acute pancreatitis 11/28/2013  . Pancreatic pseudocyst/cyst 11/25/2013  . Severe protein-calorie malnutrition ([place]) 10/10/2013  . Suicide attempt ([place]) 10/08/2013  . 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 TOBACCO ABUSE 03/23/2007    Past Surgical History:  Procedure Laterality Date  . CARDIAC CATHETERIZATION    . EYE SURGERY Left 1990's   "  result of trauma"   . FACIAL FRACTURE SURGERY Left 1990's   "result of trauma"   . FRACTURE SURGERY    . HERNIA REPAIR    . LEFT HEART CATHETERIZATION WITH CORONARY ANGIOGRAM Right 03/07/2013   Procedure: LEFT HEART CATHETERIZATION WITH CORONARY ANGIOGRAM;  Surgeon: [name], MD;  Location: [facility] CATH LAB;  Service: Cardiovascular;  Laterality: Right;  . UMBILICAL HERNIA REPAIR          Home Medications    Prior to Admission medications   Medication Sig Start Date End Date Taking?  Authorizing Provider  albuterol (PROVENTIL HFA;VENTOLIN HFA) 108 (90 Base) MCG/ACT inhaler Inhale 2 puffs into the lungs every 6 (six) hours as needed for wheezing or shortness of breath. 08/29/17   Clent Demark, PA-C  famotidine (PEPCID) 20 MG tablet Take 1 tablet (20 mg total) by mouth 2 (two) times daily. 11/16/17   Recardo Evangelist, PA-C  hydrALAZINE (APRESOLINE) 25 MG tablet Take 1 tablet (25 mg total) by mouth every 8 (eight) hours. Patient taking differently: Take 25 mg by mouth at bedtime.  08/29/17   Clent Demark, PA-C  hydrOXYzine (VISTARIL) 50 MG capsule Take 50 mg by mouth 3 (three) times daily as needed for anxiety.     [provider]  lipase/protease/amylase (CREON) 12000 units CPEP capsule Take 1 capsule (12,000 Units total) by mouth 3 (three) times daily with meals. For pancreatitis 10/01/17   Aline August, MD  loratadine (CLARITIN) 10 MG tablet Take 1 tablet (10 mg total) by mouth daily as needed for allergies or rhinitis. (May purchase from over the counter): For allergies 10/01/17   Aline August, MD  metoprolol tartrate (LOPRESSOR) 25 MG tablet Take 1 tablet (25 mg total) by mouth 2 (two) times daily. For high blood pressure 08/29/17   Clent Demark, PA-C  Multiple Vitamin (MULTIVITAMIN WITH MINERALS) TABS tablet Take 1 tablet by mouth daily. 09/06/17   Bonnell Public, MD  ondansetron (ZOFRAN) 4 MG tablet Take 1 tablet (4 mg total) by mouth every 8 (eight) hours as needed for nausea or vomiting. 11/02/17   McDonald, Mia A, PA-C  pantoprazole (PROTONIX) 40 MG tablet Take 1 tablet (40 mg total) by mouth daily. 08/29/17   Clent Demark, PA-C  sertraline (ZOLOFT) 100 MG tablet Take 1 tablet (100 mg total) by mouth daily. 08/29/17   Clent Demark, PA-C  sucralfate (CARAFATE) 1 g tablet Take 1 tablet (1 g total) by mouth 4 (four) times daily. 11/16/17   Recardo Evangelist, PA-C  omeprazole (PRILOSEC) 20 MG capsule Take 1 capsule (20 mg total) daily by  mouth. 03/22/17 06/21/17  Varney Biles, MD    Family History Family History  Problem Relation Age of Onset  . Hypertension Mother   . Cirrhosis Mother   . Alcoholism Mother   . Hypertension Father   . Melanoma Father   . Hypertension Other   . Coronary artery disease Other     Social History Social History   Tobacco Use  . Smoking status: Current Every Day Smoker    Packs/day: 1.00    Years: 33.00    Pack years: 33.00    Types: Cigarettes, E-cigarettes  . Smokeless tobacco: Never Used  Substance Use Topics  . Alcohol use: Yes  . Drug use: Yes    Types: Marijuana, Cocaine    Comment: daily marijuana use; last cocaine use about 3 months ago     Allergies   Robaxin [methocarbamol]; Shellfish-derived  products; Trazodone; Trazodone and nefazodone; Contrast media [iodinated diagnostic agents]; and Reglan [metoclopramide]   Review of Systems Review of Systems  Constitutional: Negative for chills and fever.  Respiratory: Negative for cough, chest tightness and shortness of breath.   Cardiovascular: Negative for chest pain, palpitations and leg swelling.  Gastrointestinal: Positive for abdominal pain, nausea and vomiting. Negative for abdominal distention and diarrhea.  Genitourinary: Negative for dysuria, frequency, hematuria and urgency.  Musculoskeletal: Negative for arthralgias, myalgias, neck pain and neck stiffness.  Skin: Negative for rash.  Allergic/Immunologic: Negative for immunocompromised state.  Neurological: Negative for dizziness, weakness, light-headedness, numbness and headaches.  All other systems reviewed and are negative.    Physical Exam Updated Vital Signs BP (!) 126/96 (BP Location: Right Arm)   Pulse 91   Temp (!) 97.4 F (36.3 C) (Oral)   Resp 15   Ht 5\' 9"  (1.753 m)   Wt 65.8 kg (145 lb)   SpO2 100%   BMI 21.41 kg/m   Physical Exam  Constitutional: He is oriented to person, place, and time. He appears well-developed and  well-nourished. No distress.  HENT:  Head: Normocephalic and atraumatic.  Eyes: Conjunctivae are normal.  Neck: Neck supple.  Cardiovascular: Normal rate, regular rhythm and normal heart sounds.  Pulmonary/Chest: Effort normal. No respiratory distress. He has no wheezes. He has no rales.  Abdominal: Soft. Bowel sounds are normal. He exhibits no distension. There is tenderness. There is no rebound.  Diffuse tenderness, worse in epigastric area  Musculoskeletal: He exhibits no edema.  Neurological: He is alert and oriented to person, place, and time.  Skin: Skin is warm and dry.  Nursing note and vitals reviewed.    ED Treatments / Results  Labs (all labs ordered are listed, but only abnormal results are displayed) Labs Reviewed  LIPASE, BLOOD - Abnormal; Notable for the following components:      Result Value   Lipase 55 (*)    All other components within normal limits  COMPREHENSIVE METABOLIC PANEL - Abnormal; Notable for the following components:   Glucose, Bld 121 (*)    Calcium 11.0 (*)    All other components within normal limits  CBC - Abnormal; Notable for the following components:   RBC 4.15 (*)    Hemoglobin 9.8 (*)    HCT 31.9 (*)    MCV 76.9 (*)    MCH 23.6 (*)    RDW 22.1 (*)    All other components within normal limits  ACETAMINOPHEN LEVEL    EKG None  Radiology No results found.  Procedures Procedures (including critical care time)  Medications Ordered in ED Medications  sucralfate (CARAFATE) tablet 1 g (has no administration in time range)  LORazepam (ATIVAN) tablet 1 mg (has no administration in time range)  sodium chloride 0.9 % bolus 1,000 mL (1,000 mLs Intravenous New Bag/Given 11/17/17 1947)  ketorolac (TORADOL) 30 MG/ML injection 30 mg (30 mg Intravenous Given 11/17/17 1938)  gi cocktail (Maalox,Lidocaine,Donnatal) (30 mLs Oral Given 11/17/17 1938)  famotidine (PEPCID) IVPB 20 mg premix (20 mg Intravenous New Bag/Given 11/17/17 1948)    ondansetron (ZOFRAN) injection 4 mg (4 mg Intravenous Given 11/17/17 1937)  promethazine (PHENERGAN) injection 12.5 mg (12.5 mg Intravenous Given 11/17/17 2043)     Initial Impression / Assessment and Plan / ED Course  I have reviewed the triage vital signs and the nursing notes.  Pertinent labs & imaging results that were available during my care of the patient were reviewed by me and  considered in my medical decision making (see chart for details).     Sent in the emergency department with chronic abdominal pain.  This is a recurrent issue.  History of pancreatitis.  He has been drinking alcohol to self medicate to help his pain.,  Which I told him is probably not the best idea.  His vital signs are normal.  He does not appear to be in any distress.  His abdomen is soft with no guarding or rebound tenderness.  There is no peritoneal signs.  He is not actively vomiting at this time.  He has had 32 visits in the last 6 months for the same.  We will try GI cocktail and Zofran.  Minimal relief with GI cocktail and Zofran, patient states he is still very nauseated, will add Phenergan.   Pt states he is now getting shaky.  Continues to have severe pain.  Will give ativan and carafate for additional pain relief.  Patient is also now telling us that he thinks he took too much Tylenol.  Initially told me he took 8 tablets throughout the day today, but now he is telling us that he took 11-12.  It is unclear how many Tylenols he took, will check Tylenol levels.  This Tylenol is a 500 mg each.  His LFTs are normal right now.   10:05 PM Patient continues to have pain but feels better.  He is tolerating oral fluids.  He is begging me for morphine.  I explained to him since his lipase is normal, he has been drinking alcohol, do not think opiates are indicated at this time to him that he will not be getting any opiates.  I will give him another dose of Maalox.   Vitals:   11/17/17 2204 11/17/17 2205  11/17/17 2208 11/17/17 2215  BP:    125/84  Pulse: 80 81 81   Resp:      Temp:    98.4 F (36.9 C)  TempSrc:    Oral  SpO2: 100% 100% 100%   Weight:      Height:         Final Clinical Impressions(s) / ED Diagnoses   Final diagnoses:  Epigastric pain    ED Discharge Orders    None       Jeannett Senior, PA-C 11/18/17 Kevil, Dwight, DO 11/20/17 [phone]

## 2017-11-17 NOTE — Discharge Instructions (Addendum)
Stop drinking alcohol.  Do not take more Tylenol than recommended on the Tylenol bottle.  Continue your medications at home.  Follow-up with your doctor as needed

## 2017-11-17 NOTE — ED Triage Notes (Signed)
The p[t arrived by [facility] c/o abd pain he has chronic pancreatitis  He denies drinking any alcohol.  He was seen here last pm for the same  He reports that his pain is no better with n v and diarrhea

## 2017-11-17 NOTE — ED Notes (Signed)
EDPA informed that pt stated he has taken 11 500mg  tylenol today

## 2017-11-18 ENCOUNTER — Emergency Department (HOSPITAL_COMMUNITY)
Admission: EM | Admit: 2017-11-18 | Discharge: 2017-11-18 | Disposition: A | Discharge status: Home / Self Care | Payer: Self-pay | Attending: Emergency Medicine | Admitting: Emergency Medicine

## 2017-11-18 ENCOUNTER — Encounter (HOSPITAL_COMMUNITY): Payer: Self-pay

## 2017-11-18 DIAGNOSIS — F17210 Nicotine dependence, cigarettes, uncomplicated: Secondary | ICD-10-CM | POA: Insufficient documentation

## 2017-11-18 DIAGNOSIS — R109 Unspecified abdominal pain: Secondary | ICD-10-CM | POA: Insufficient documentation

## 2017-11-18 DIAGNOSIS — K860 Alcohol-induced chronic pancreatitis: Secondary | ICD-10-CM | POA: Insufficient documentation

## 2017-11-18 DIAGNOSIS — Z79899 Other long term (current) drug therapy: Secondary | ICD-10-CM | POA: Insufficient documentation

## 2017-11-18 DIAGNOSIS — I10 Essential (primary) hypertension: Secondary | ICD-10-CM | POA: Insufficient documentation

## 2017-11-18 DIAGNOSIS — G8929 Other chronic pain: Secondary | ICD-10-CM | POA: Insufficient documentation

## 2017-11-18 LAB — COMPREHENSIVE METABOLIC PANEL
ALBUMIN: 3.6 g/dL (ref 3.5–5.0)
ALT: 18 U/L (ref 0–44)
AST: 25 U/L (ref 15–41)
Alkaline Phosphatase: 71 U/L (ref 38–126)
Anion gap: 12 (ref 5–15)
BILIRUBIN TOTAL: 0.7 mg/dL (ref 0.3–1.2)
BUN: 8 mg/dL (ref 6–20)
CALCIUM: 10 mg/dL (ref 8.9–10.3)
CO2: 19 mmol/L — ABNORMAL LOW (ref 22–32)
Chloride: 105 mmol/L (ref 98–111)
Creatinine, Ser: 0.58 mg/dL — ABNORMAL LOW (ref 0.61–1.24)
GFR calc Af Amer: >60 mL/min (ref >60)
GLUCOSE: 92 mg/dL (ref 70–99)
POTASSIUM: 3.8 mmol/L (ref 3.5–5.1)
Sodium: 136 mmol/L (ref 135–145)
Total Protein: 6.8 g/dL (ref 6.5–8.1)

## 2017-11-18 LAB — CBC WITH DIFFERENTIAL/PLATELET
Basophils Absolute: 0 10*3/uL (ref 0.0–0.1)
Basophils Relative: 0 %
EOS PCT: 2 %
Eosinophils Absolute: 0.2 10*3/uL (ref 0.0–0.7)
HEMATOCRIT: 31.6 % — AB (ref 39.0–52.0)
HEMOGLOBIN: 9.5 g/dL — AB (ref 13.0–17.0)
LYMPHS ABS: 2.1 10*3/uL (ref 0.7–4.0)
LYMPHS PCT: 19 %
MCH: 23.9 pg — ABNORMAL LOW (ref 26.0–34.0)
MCHC: 30.1 g/dL (ref 30.0–36.0)
MCV: 79.6 fL (ref 78.0–100.0)
MONOS PCT: 7 %
Monocytes Absolute: 0.8 10*3/uL (ref 0.1–1.0)
NEUTROS ABS: 7.9 10*3/uL — AB (ref 1.7–7.7)
Neutrophils Relative %: 72 %
Platelets: 228 10*3/uL (ref 150–400)
RBC: 3.97 MIL/uL — AB (ref 4.22–5.81)
RDW: 22.7 % — AB (ref 11.5–15.5)
WBC: 11 10*3/uL — ABNORMAL HIGH (ref 4.0–10.5)

## 2017-11-18 LAB — LIPASE, BLOOD: LIPASE: 43 U/L (ref 11–51)

## 2017-11-18 MED ORDER — GI COCKTAIL ~~LOC~~
30.0000 mL | Freq: Once | ORAL | Status: AC
  Administered 2017-11-18: 30 mL via ORAL
  Filled 2017-11-18: qty 30

## 2017-11-18 MED ORDER — FENTANYL CITRATE (PF) 100 MCG/2ML IJ SOLN
25.0000 ug | Freq: Once | INTRAMUSCULAR | Status: AC
Start: 2017-11-18 — End: 2017-11-18
  Administered 2017-11-18: 25 ug via INTRAVENOUS
  Filled 2017-11-18: qty 2

## 2017-11-18 MED ORDER — SUCRALFATE 1 G PO TABS
1.0000 g | ORAL_TABLET | Freq: Once | ORAL | Status: AC
  Administered 2017-11-18: 1 g via ORAL
  Filled 2017-11-18: qty 1

## 2017-11-18 MED ORDER — HALOPERIDOL LACTATE 5 MG/ML IJ SOLN
2.0000 mg | Freq: Once | INTRAMUSCULAR | Status: AC
  Administered 2017-11-18: 2 mg via INTRAVENOUS
  Filled 2017-11-18: qty 1

## 2017-11-18 MED ORDER — SODIUM CHLORIDE 0.9 % IV BOLUS
1000.0000 mL | Freq: Once | INTRAVENOUS | Status: AC
  Administered 2017-11-18: 1000 mL via INTRAVENOUS

## 2017-11-18 MED ORDER — KETOROLAC TROMETHAMINE 15 MG/ML IJ SOLN
15.0000 mg | Freq: Once | INTRAMUSCULAR | Status: AC
Start: 2017-11-18 — End: 2017-11-18
  Administered 2017-11-18: 15 mg via INTRAVENOUS
  Filled 2017-11-18: qty 1

## 2017-11-18 MED ORDER — DIPHENHYDRAMINE HCL 50 MG/ML IJ SOLN
25.0000 mg | Freq: Once | INTRAMUSCULAR | Status: AC
  Administered 2017-11-18: 25 mg via INTRAVENOUS
  Filled 2017-11-18: qty 1

## 2017-11-18 MED ORDER — OXYCODONE-ACETAMINOPHEN 5-325 MG PO TABS: 2.0000 | ORAL_TABLET | Freq: Once | ORAL | Status: DC

## 2017-11-18 NOTE — ED Provider Notes (Signed)
[facility]ERGENCY DEPARTMENT Provider Note   CSN: [id number] Arrival date & time: 11/18/17  1144     History   Chief Complaint Chief Complaint  Patient presents with  . Abdominal Pain    HPI [name] is a 47 y.o. male.  HPI.   47 year old male with extensive past medical history of chronic pancreatitis and abdominal pain here with nausea, vomiting.  Patient has been seen multiple times in the last week for this.  He reports that over the last several days, has had progressive worsening abdominal pain.  Is been taking Tylenol, 2 tablets, every 4-5 hours which he states he no he is nonspecific.  He has not been drinking for 2 days.  He has had associated nausea and vomiting.  Denies any tremulousness.  Denies any hallucinations.  He states that his pain is similar to his chronic pancreatitis pain, only worse.  He is requesting narcotics.  Denies any other complaints.  No fevers or chills.  No urinary symptoms.  Pain is worse after eating.  No relieving factors.  Past Medical History:  Diagnosis Date  . Alcoholism /alcohol abuse ([place])   . Anemia   . Anxiety   . Arthritis    "knees; arms; elbows" (03/26/2015)  . Asthma   . Bipolar disorder ([place])   . Chronic bronchitis ([place])   . Chronic lower back pain   . Chronic pancreatitis ([place])   . Cocaine abuse ([place])   . Depression   . Family history of adverse reaction to anesthesia    "grandmother gets confused"  . Femoral condyle fracture ([place]) 03/08/2014   left medial/notes 03/09/2014  . GERD (gastroesophageal reflux disease)   . H/O hiatal hernia   . H/O suicide attempt 10/2012  . Heart murmur    "when he was little" (03/06/2013)  . High cholesterol   . History of blood transfusion 10/2012   "when I tried to commit suicide"  . History of stomach ulcers   . Hypertension   . Marijuana abuse, continuous   . Migraine    "a few times/year" (03/26/2015)  . Pneumonia 1990's X 3  . PTSD  (post-traumatic stress disorder)   . Shortness of breath    "can happen at anytime" (03/06/2013)  . Sickle cell trait ([place])   . WPW (Wolff-Parkinson-White syndrome)    [name] 03/06/2013    Patient Active Problem List   Diagnosis Date Noted  . Acute pancreatitis 09/28/2017  . Abdominal pain 05/27/2017  . Hematemesis 05/27/2017  . Tachycardia 03/18/2017  . Diarrhea 03/18/2017  . Acute on chronic pancreatitis ([place]) 12/17/2016  . Intractable nausea and vomiting 12/05/2016  . Verbally abusive behavior 12/05/2016  . Normocytic anemia 12/05/2016  . Alcohol use disorder, severe, dependence ([place]) 07/25/2016  . Cocaine use disorder, severe, dependence ([place]) 07/25/2016  . Major depressive disorder, recurrent severe without psychotic features ([place]) 07/20/2016  . Leukocytosis   . Hospital acquired PNA 05/20/2015  . Pseudocyst of pancreas 05/18/2015  . Polysubstance abuse (tobacco, cocaine, THC, and ETOH) 03/26/2015  . Alcohol-induced chronic pancreatitis ([place])   . Benign essential HTN 02/06/2014  . Alcohol-induced acute pancreatitis 11/28/2013  . Pancreatic pseudocyst/cyst 11/25/2013  . Severe protein-calorie malnutrition ([place]) 10/10/2013  . Suicide attempt ([place]) 10/08/2013  . 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 TOBACCO ABUSE 03/23/2007    Past Surgical History:  Procedure Laterality Date  . CARDIAC CATHETERIZATION    . EYE SURGERY Left 1990's   "result of  trauma"   . FACIAL FRACTURE SURGERY Left 1990's   "result of trauma"   . FRACTURE SURGERY    . HERNIA REPAIR    . LEFT HEART CATHETERIZATION WITH CORONARY ANGIOGRAM Right 03/07/2013   Procedure: LEFT HEART CATHETERIZATION WITH CORONARY ANGIOGRAM;  Surgeon: [name], MD;  Location: [facility] CATH LAB;  Service: Cardiovascular;  Laterality: Right;  . UMBILICAL HERNIA REPAIR          Home Medications    Prior to Admission medications   Medication Sig Start Date End Date Taking? Authorizing  Provider  acetaminophen (TYLENOL) 500 MG tablet Take 1,000 mg by mouth every 4 (four) hours as needed for moderate pain.   Yes [provider]  albuterol (PROVENTIL HFA;VENTOLIN HFA) 108 (90 Base) MCG/ACT inhaler Inhale 2 puffs into the lungs every 6 (six) hours as needed for wheezing or shortness of breath. 08/29/17  Yes Clent Demark, PA-C  famotidine (PEPCID) 20 MG tablet Take 1 tablet (20 mg total) by mouth 2 (two) times daily. 11/16/17  Yes Recardo Evangelist, PA-C  hydrALAZINE (APRESOLINE) 25 MG tablet Take 1 tablet (25 mg total) by mouth every 8 (eight) hours. Patient taking differently: Take 25 mg by mouth at bedtime.  08/29/17  Yes Clent Demark, PA-C  hydrOXYzine (VISTARIL) 50 MG capsule Take 50 mg by mouth 3 (three) times daily as needed for anxiety.    Yes [provider]  lipase/protease/amylase (CREON) 12000 units CPEP capsule Take 1 capsule (12,000 Units total) by mouth 3 (three) times daily with meals. For pancreatitis 10/01/17  Yes Aline August, MD  loratadine (CLARITIN) 10 MG tablet Take 1 tablet (10 mg total) by mouth daily as needed for allergies or rhinitis. (May purchase from over the counter): For allergies 10/01/17  Yes Aline August, MD  metoprolol tartrate (LOPRESSOR) 25 MG tablet Take 1 tablet (25 mg total) by mouth 2 (two) times daily. For high blood pressure 08/29/17  Yes Clent Demark, PA-C  Multiple Vitamin (MULTIVITAMIN WITH MINERALS) TABS tablet Take 1 tablet by mouth daily. 09/06/17  Yes Bonnell Public, MD  ondansetron (ZOFRAN) 4 MG tablet Take 1 tablet (4 mg total) by mouth every 8 (eight) hours as needed for nausea or vomiting. 11/02/17  Yes McDonald, Mia A, PA-C  pantoprazole (PROTONIX) 40 MG tablet Take 1 tablet (40 mg total) by mouth daily. 08/29/17  Yes Clent Demark, PA-C  sertraline (ZOLOFT) 100 MG tablet Take 1 tablet (100 mg total) by mouth daily. 08/29/17  Yes Clent Demark, PA-C  sucralfate (CARAFATE) 1 g tablet Take  1 tablet (1 g total) by mouth 4 (four) times daily. 11/16/17  Yes Recardo Evangelist, PA-C  omeprazole (PRILOSEC) 20 MG capsule Take 1 capsule (20 mg total) daily by mouth. 03/22/17 06/21/17  Varney Biles, MD    Family History Family History  Problem Relation Age of Onset  . Hypertension Mother   . Cirrhosis Mother   . Alcoholism Mother   . Hypertension Father   . Melanoma Father   . Hypertension Other   . Coronary artery disease Other     Social History Social History   Tobacco Use  . Smoking status: Current Every Day Smoker    Packs/day: 1.00    Years: 33.00    Pack years: 33.00    Types: Cigarettes, E-cigarettes  . Smokeless tobacco: Never Used  Substance Use Topics  . Alcohol use: Yes  . Drug use: Yes    Types: Marijuana, Cocaine  Comment: daily marijuana use; last cocaine use about 3 months ago     Allergies   Robaxin [methocarbamol]; Shellfish-derived products; Trazodone; Trazodone and nefazodone; Contrast media [iodinated diagnostic agents]; and Reglan [metoclopramide]   Review of Systems Review of Systems  Constitutional: Positive for fatigue. Negative for chills and fever.  HENT: Negative for congestion and rhinorrhea.   Eyes: Negative for visual disturbance.  Respiratory: Negative for cough, shortness of breath and wheezing.   Cardiovascular: Negative for chest pain and leg swelling.  Gastrointestinal: Positive for abdominal pain, nausea and vomiting. Negative for diarrhea.  Genitourinary: Negative for dysuria and flank pain.  Musculoskeletal: Negative for neck pain and neck stiffness.  Skin: Negative for rash and wound.  Allergic/Immunologic: Negative for immunocompromised state.  Neurological: Positive for weakness. Negative for syncope and headaches.  All other systems reviewed and are negative.    Physical Exam Updated Vital Signs BP (!) 137/93   Pulse (!) 103   Temp 98.1 F (36.7 C) (Oral)   Resp 15   SpO2 100%   Physical Exam    Constitutional: He is oriented to person, place, and time. He appears well-developed and well-nourished. No distress.  HENT:  Head: Normocephalic and atraumatic.  Eyes: Conjunctivae are normal.  Neck: Neck supple.  Cardiovascular: Normal rate, regular rhythm and normal heart sounds. Exam reveals no friction rub.  No murmur heard. Pulmonary/Chest: Effort normal and breath sounds normal. No respiratory distress. He has no wheezes. He has no rales.  Abdominal: He exhibits no distension. There is tenderness in the epigastric area. There is no rigidity, no rebound and no guarding.  Musculoskeletal: He exhibits no edema.  Neurological: He is alert and oriented to person, place, and time. He exhibits normal muscle tone.  Skin: Skin is warm. Capillary refill takes less than 2 seconds.  Psychiatric: He has a normal mood and affect.  Nursing note and vitals reviewed.    ED Treatments / Results  Labs (all labs ordered are listed, but only abnormal results are displayed) Labs Reviewed  CBC WITH DIFFERENTIAL/PLATELET - Abnormal; Notable for the following components:      Result Value   WBC 11.0 (*)    RBC 3.97 (*)    Hemoglobin 9.5 (*)    HCT 31.6 (*)    MCH 23.9 (*)    RDW 22.7 (*)    Neutro Abs 7.9 (*)    All other components within normal limits  COMPREHENSIVE METABOLIC PANEL - Abnormal; Notable for the following components:   CO2 19 (*)    Creatinine, Ser 0.58 (*)    All other components within normal limits  LIPASE, BLOOD    EKG EKG Interpretation  Date/Time:  Saturday November 18 2017 14:18:49 EDT Ventricular Rate:  90 PR Interval:    QRS Duration: 75 QT Interval:  310 QTC Calculation: 380 R Axis:   74 Text Interpretation:  Sinus rhythm Short PR interval Biatrial enlargement Abnormal T, consider ischemia, anterior leads No significant change since last tracing Confirmed by [name] [phone]) on 11/18/2017 2:32:54 PM   Radiology No results  found.  Procedures Procedures (including critical care time)  Medications Ordered in ED Medications  sodium chloride 0.9 % bolus 1,000 mL (0 mLs Intravenous Stopped 11/18/17 1512)  haloperidol lactate (HALDOL) injection 2 mg (2 mg Intravenous Given 11/18/17 1411)  diphenhydrAMINE (BENADRYL) injection 25 mg (25 mg Intravenous Given 11/18/17 1411)  gi cocktail (Maalox,Lidocaine,Donnatal) (30 mLs Oral Given 11/18/17 1411)  sucralfate (CARAFATE) tablet 1 g (1 g Oral  Given 11/18/17 1529)  ketorolac (TORADOL) 15 MG/ML injection 15 mg (15 mg Intravenous Given 11/18/17 1529)  fentaNYL (SUBLIMAZE) injection 25 mcg (25 mcg Intravenous Given 11/18/17 1529)     Initial Impression / Assessment and Plan / ED Course  I have reviewed the triage vital signs and the nursing notes.  Pertinent labs & imaging results that were available during my care of the patient were reviewed by me and considered in my medical decision making (see chart for details).     47 yo M with well documented h/o chronic abd pain, recurrent chronic pancreatitis here with acute on chronic abd pain. Abdomen tender but w/o rebound or guarding. Pt given GI cocktail, antiemetics, fluids with sx relief. His labs, including lFTs, renal function, and lipase are at baseline. Serial abd exams benign. Do not feel repeat imaging indicated based on labs, sx improvement, vitals. Will continue supportive care at home.  Final Clinical Impressions(s) / ED Diagnoses   Final diagnoses:  Alcohol-induced chronic pancreatitis ([place])  Chronic abdominal pain    ED Discharge Orders    None       [name], MD 11/18/17 [phone]

## 2017-11-18 NOTE — ED Triage Notes (Signed)
Pt presents for evaluation of ongoing pancreatitis. Pt reports he was told to quit taking tylenol but has been taking all day. States has taken 6 extra strength tylenol since this morning.

## 2017-11-18 NOTE — ED Notes (Signed)
Pt discharged from ED; instructions provided; Pt encouraged to return to ED if symptoms worsen and to f/u with PCP; Pt verbalized understanding of all instructions 

## 2017-11-23 ENCOUNTER — Emergency Department (HOSPITAL_COMMUNITY): Payer: Self-pay

## 2017-11-23 ENCOUNTER — Encounter (HOSPITAL_COMMUNITY): Payer: Self-pay | Admitting: Emergency Medicine

## 2017-11-23 ENCOUNTER — Inpatient Hospital Stay (HOSPITAL_COMMUNITY)
Admission: EM | Admit: 2017-11-23 | Discharge: 2017-11-25 | DRG: 378 | Disposition: A | Discharge status: Home / Self Care | Payer: Self-pay | Attending: Internal Medicine | Admitting: Internal Medicine

## 2017-11-23 ENCOUNTER — Other Ambulatory Visit: Payer: Self-pay

## 2017-11-23 DIAGNOSIS — D573 Sickle-cell trait: Secondary | ICD-10-CM | POA: Diagnosis present

## 2017-11-23 DIAGNOSIS — K264 Chronic or unspecified duodenal ulcer with hemorrhage: Principal | ICD-10-CM | POA: Diagnosis present

## 2017-11-23 DIAGNOSIS — Z91041 Radiographic dye allergy status: Secondary | ICD-10-CM

## 2017-11-23 DIAGNOSIS — I456 Pre-excitation syndrome: Secondary | ICD-10-CM | POA: Diagnosis present

## 2017-11-23 DIAGNOSIS — K922 Gastrointestinal hemorrhage, unspecified: Secondary | ICD-10-CM | POA: Diagnosis present

## 2017-11-23 DIAGNOSIS — M545 Low back pain: Secondary | ICD-10-CM | POA: Diagnosis present

## 2017-11-23 DIAGNOSIS — M170 Bilateral primary osteoarthritis of knee: Secondary | ICD-10-CM | POA: Diagnosis present

## 2017-11-23 DIAGNOSIS — M19022 Primary osteoarthritis, left elbow: Secondary | ICD-10-CM | POA: Diagnosis present

## 2017-11-23 DIAGNOSIS — K860 Alcohol-induced chronic pancreatitis: Secondary | ICD-10-CM | POA: Diagnosis present

## 2017-11-23 DIAGNOSIS — K2971 Gastritis, unspecified, with bleeding: Secondary | ICD-10-CM | POA: Diagnosis present

## 2017-11-23 DIAGNOSIS — Z8249 Family history of ischemic heart disease and other diseases of the circulatory system: Secondary | ICD-10-CM

## 2017-11-23 DIAGNOSIS — J45909 Unspecified asthma, uncomplicated: Secondary | ICD-10-CM | POA: Diagnosis present

## 2017-11-23 DIAGNOSIS — F17210 Nicotine dependence, cigarettes, uncomplicated: Secondary | ICD-10-CM | POA: Diagnosis present

## 2017-11-23 DIAGNOSIS — F1910 Other psychoactive substance abuse, uncomplicated: Secondary | ICD-10-CM | POA: Diagnosis present

## 2017-11-23 DIAGNOSIS — Z8711 Personal history of peptic ulcer disease: Secondary | ICD-10-CM

## 2017-11-23 DIAGNOSIS — F1210 Cannabis abuse, uncomplicated: Secondary | ICD-10-CM | POA: Diagnosis present

## 2017-11-23 DIAGNOSIS — F1020 Alcohol dependence, uncomplicated: Secondary | ICD-10-CM | POA: Diagnosis present

## 2017-11-23 DIAGNOSIS — I10 Essential (primary) hypertension: Secondary | ICD-10-CM | POA: Diagnosis present

## 2017-11-23 DIAGNOSIS — Z808 Family history of malignant neoplasm of other organs or systems: Secondary | ICD-10-CM

## 2017-11-23 DIAGNOSIS — K319 Disease of stomach and duodenum, unspecified: Secondary | ICD-10-CM | POA: Diagnosis present

## 2017-11-23 DIAGNOSIS — F17290 Nicotine dependence, other tobacco product, uncomplicated: Secondary | ICD-10-CM | POA: Diagnosis present

## 2017-11-23 DIAGNOSIS — M19021 Primary osteoarthritis, right elbow: Secondary | ICD-10-CM | POA: Diagnosis present

## 2017-11-23 DIAGNOSIS — F319 Bipolar disorder, unspecified: Secondary | ICD-10-CM | POA: Diagnosis present

## 2017-11-23 DIAGNOSIS — D72829 Elevated white blood cell count, unspecified: Secondary | ICD-10-CM | POA: Diagnosis present

## 2017-11-23 DIAGNOSIS — D62 Acute posthemorrhagic anemia: Secondary | ICD-10-CM | POA: Diagnosis present

## 2017-11-23 DIAGNOSIS — K219 Gastro-esophageal reflux disease without esophagitis: Secondary | ICD-10-CM | POA: Diagnosis present

## 2017-11-23 DIAGNOSIS — F419 Anxiety disorder, unspecified: Secondary | ICD-10-CM | POA: Diagnosis present

## 2017-11-23 DIAGNOSIS — Z885 Allergy status to narcotic agent status: Secondary | ICD-10-CM

## 2017-11-23 DIAGNOSIS — R0789 Other chest pain: Secondary | ICD-10-CM | POA: Diagnosis present

## 2017-11-23 DIAGNOSIS — G8929 Other chronic pain: Secondary | ICD-10-CM | POA: Diagnosis present

## 2017-11-23 DIAGNOSIS — F1410 Cocaine abuse, uncomplicated: Secondary | ICD-10-CM | POA: Diagnosis present

## 2017-11-23 DIAGNOSIS — F4310 Post-traumatic stress disorder, unspecified: Secondary | ICD-10-CM | POA: Diagnosis present

## 2017-11-23 DIAGNOSIS — Z765 Malingerer [conscious simulation]: Secondary | ICD-10-CM

## 2017-11-23 DIAGNOSIS — Z915 Personal history of self-harm: Secondary | ICD-10-CM

## 2017-11-23 DIAGNOSIS — F1310 Sedative, hypnotic or anxiolytic abuse, uncomplicated: Secondary | ICD-10-CM | POA: Diagnosis present

## 2017-11-23 DIAGNOSIS — K861 Other chronic pancreatitis: Secondary | ICD-10-CM | POA: Diagnosis present

## 2017-11-23 DIAGNOSIS — Z811 Family history of alcohol abuse and dependence: Secondary | ICD-10-CM

## 2017-11-23 DIAGNOSIS — Z79899 Other long term (current) drug therapy: Secondary | ICD-10-CM

## 2017-11-23 DIAGNOSIS — Z888 Allergy status to other drugs, medicaments and biological substances status: Secondary | ICD-10-CM

## 2017-11-23 DIAGNOSIS — K921 Melena: Secondary | ICD-10-CM

## 2017-11-23 DIAGNOSIS — D649 Anemia, unspecified: Secondary | ICD-10-CM

## 2017-11-23 DIAGNOSIS — E7800 Pure hypercholesterolemia, unspecified: Secondary | ICD-10-CM | POA: Diagnosis present

## 2017-11-23 LAB — PREPARE RBC (CROSSMATCH)

## 2017-11-23 LAB — COMPREHENSIVE METABOLIC PANEL
ALBUMIN: 3.2 g/dL — AB (ref 3.5–5.0)
ALK PHOS: 57 U/L (ref 38–126)
ALT: 20 U/L (ref 0–44)
ANION GAP: 9 (ref 5–15)
AST: 40 U/L (ref 15–41)
BILIRUBIN TOTAL: 0.3 mg/dL (ref 0.3–1.2)
CALCIUM: 8.8 mg/dL — AB (ref 8.9–10.3)
CO2: 21 mmol/L — ABNORMAL LOW (ref 22–32)
Chloride: 109 mmol/L (ref 98–111)
Creatinine, Ser: 0.72 mg/dL (ref 0.61–1.24)
GFR calc Af Amer: >60 mL/min (ref >60)
GFR calc non Af Amer: >60 mL/min (ref >60)
GLUCOSE: 123 mg/dL — AB (ref 70–99)
Potassium: 4 mmol/L (ref 3.5–5.1)
Sodium: 139 mmol/L (ref 135–145)
TOTAL PROTEIN: 5.6 g/dL — AB (ref 6.5–8.1)

## 2017-11-23 LAB — CBC
HCT: 18.4 % — ABNORMAL LOW (ref 39.0–52.0)
Hemoglobin: 5.6 g/dL — CL (ref 13.0–17.0)
MCH: 23.9 pg — ABNORMAL LOW (ref 26.0–34.0)
MCHC: 30.4 g/dL (ref 30.0–36.0)
MCV: 78.6 fL (ref 78.0–100.0)
Platelets: 276 10*3/uL (ref 150–400)
RBC: 2.34 MIL/uL — ABNORMAL LOW (ref 4.22–5.81)
RDW: 22.9 % — AB (ref 11.5–15.5)
WBC: 10.3 10*3/uL (ref 4.0–10.5)

## 2017-11-23 LAB — URINALYSIS, ROUTINE W REFLEX MICROSCOPIC
BILIRUBIN URINE: NEGATIVE
Glucose, UA: NEGATIVE mg/dL
Hgb urine dipstick: NEGATIVE
KETONES UR: NEGATIVE mg/dL
Leukocytes, UA: NEGATIVE
NITRITE: NEGATIVE
Protein, ur: NEGATIVE mg/dL
Specific Gravity, Urine: 1.015 (ref 1.005–1.030)
pH: 5 (ref 5.0–8.0)

## 2017-11-23 LAB — RETICULOCYTES
RBC.: 2.45 MIL/uL — ABNORMAL LOW (ref 4.22–5.81)
RETIC COUNT ABSOLUTE: 78.4 10*3/uL (ref 19.0–186.0)
RETIC CT PCT: 3.2 % — AB (ref 0.4–3.1)

## 2017-11-23 LAB — PROTIME-INR
INR: 0.96
PROTHROMBIN TIME: 12.7 s (ref 11.4–15.2)

## 2017-11-23 LAB — APTT: APTT: 30 s (ref 24–36)

## 2017-11-23 LAB — I-STAT BETA HCG BLOOD, ED (MC, WL, AP ONLY): I-stat hCG, quantitative: <5 m[IU]/mL (ref <5)

## 2017-11-23 LAB — I-STAT TROPONIN, ED: TROPONIN I, POC: 0 ng/mL (ref 0.00–0.08)

## 2017-11-23 LAB — LIPASE, BLOOD: Lipase: 46 U/L (ref 11–51)

## 2017-11-23 MED ORDER — ONDANSETRON HCL 4 MG/2ML IJ SOLN
4.0000 mg | Freq: Once | INTRAMUSCULAR | Status: AC
  Administered 2017-11-24: 4 mg via INTRAVENOUS
  Filled 2017-11-23: qty 2

## 2017-11-23 MED ORDER — SODIUM CHLORIDE 0.9 % IV BOLUS (SEPSIS)
1000.0000 mL | Freq: Once | INTRAVENOUS | Status: AC
  Administered 2017-11-23: 1000 mL via INTRAVENOUS

## 2017-11-23 MED ORDER — SODIUM CHLORIDE 0.9 % IV SOLN
8.0000 mg/h | INTRAVENOUS | Status: DC
  Administered 2017-11-24 – 2017-11-25 (×3): 8 mg/h via INTRAVENOUS
  Filled 2017-11-23 (×5): qty 80

## 2017-11-23 MED ORDER — PANTOPRAZOLE SODIUM 40 MG IV SOLR
80.0000 mg | Freq: Once | INTRAVENOUS | Status: AC
  Administered 2017-11-24: 80 mg via INTRAVENOUS
  Filled 2017-11-23: qty 80

## 2017-11-23 MED ORDER — FAMOTIDINE IN NACL 20-0.9 MG/50ML-% IV SOLN
20.0000 mg | Freq: Once | INTRAVENOUS | Status: AC
  Administered 2017-11-24: 20 mg via INTRAVENOUS
  Filled 2017-11-23: qty 50

## 2017-11-23 MED ORDER — GI COCKTAIL ~~LOC~~
30.0000 mL | Freq: Once | ORAL | Status: AC
  Administered 2017-11-23: 30 mL via ORAL
  Filled 2017-11-23: qty 30

## 2017-11-23 MED ORDER — SODIUM CHLORIDE 0.9 % IV SOLN
10.0000 mL/h | Freq: Once | INTRAVENOUS | Status: AC
  Administered 2017-11-24: 10 mL/h via INTRAVENOUS

## 2017-11-23 MED ORDER — PANTOPRAZOLE SODIUM 40 MG IV SOLR: 40.0000 mg | Freq: Two times a day (BID) | INTRAVENOUS | Status: DC

## 2017-11-23 MED ORDER — SODIUM CHLORIDE 0.9 % IV SOLN
1000.0000 mL | INTRAVENOUS | Status: DC
Start: 2017-11-23 — End: 2017-11-24
  Administered 2017-11-24: 1000 mL via INTRAVENOUS

## 2017-11-23 NOTE — ED Provider Notes (Signed)
[facility] EMERGENCY DEPARTMENT Provider Note   CSN: [id number] Arrival date & time: 11/23/17  1779     History   Chief Complaint Chief Complaint  Patient presents with  . Abdominal Pain    HPI [name] is a 47 y.o. male.  Patient with history of chronic pancreatitis, chronic abdominal pain, alcohol abuse presenting with epigastric pain with nausea, vomiting and rectal bleeding.  States he developed his usual upper abdominal pain with multiple episodes of nonbloody nonbilious emesis yesterday.  Today he had "coffee-ground stools" with burgundy blood mixed in.  This happened about 4 or 5 times and he had bright red blood with wiping and bright red blood in the toilet bowl.  He endorses some dizziness and lightheadedness and developed right-sided chest pain today it has been constant associated with shortness of breath.  He does not take any blood thinners.  Patient has a history of ulcers documented in his chart but denies ever having an EGD.  He continues to drink alcohol, last drink was 3 days ago and is been taking ibuprofen over-the-counter.  He denies any fever or chills.  He denies any previous abdominal surgeries.  The history is provided by the patient.  Abdominal Pain   Associated symptoms include nausea and vomiting. Pertinent negatives include fever, dysuria, hematuria, arthralgias and myalgias.    Past Medical History:  Diagnosis Date  . Alcoholism /alcohol abuse ([place])   . Anemia   . Anxiety   . Arthritis    "knees; arms; elbows" (03/26/2015)  . Asthma   . Bipolar disorder ([place])   . Chronic bronchitis ([place])   . Chronic lower back pain   . Chronic pancreatitis ([place])   . Cocaine abuse ([place])   . Depression   . Family history of adverse reaction to anesthesia    "grandmother gets confused"  . Femoral condyle fracture ([place]) 03/08/2014   left medial/notes 03/09/2014  . GERD (gastroesophageal reflux disease)   . H/O hiatal hernia   .  H/O suicide attempt 10/2012  . Heart murmur    "when he was little" (03/06/2013)  . High cholesterol   . History of blood transfusion 10/2012   "when I tried to commit suicide"  . History of stomach ulcers   . Hypertension   . Marijuana abuse, continuous   . Migraine    "a few times/year" (03/26/2015)  . Pneumonia 1990's X 3  . PTSD (post-traumatic stress disorder)   . Shortness of breath    "can happen at anytime" (03/06/2013)  . Sickle cell trait ([place])   . WPW (Wolff-Parkinson-White syndrome)    [name] 03/06/2013    Patient Active Problem List   Diagnosis Date Noted  . Acute pancreatitis 09/28/2017  . Abdominal pain 05/27/2017  . Hematemesis 05/27/2017  . Tachycardia 03/18/2017  . Diarrhea 03/18/2017  . Acute on chronic pancreatitis ([place]) 12/17/2016  . Intractable nausea and vomiting 12/05/2016  . Verbally abusive behavior 12/05/2016  . Normocytic anemia 12/05/2016  . Alcohol use disorder, severe, dependence ([place]) 07/25/2016  . Cocaine use disorder, severe, dependence ([place]) 07/25/2016  . 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 Leukocytosis   . Hospital acquired PNA 05/20/2015  . Pseudocyst of pancreas 05/18/2015  . Polysubstance abuse (tobacco, cocaine, THC, and ETOH) 03/26/2015  . Alcohol-induced chronic pancreatitis ([place])   . Benign essential HTN 02/06/2014  . Alcohol-induced acute pancreatitis 11/28/2013  . Pancreatic pseudocyst/cyst 11/25/2013  . Severe protein-calorie malnutrition ([place]) 10/10/2013  .  Suicide attempt ([place]) 10/08/2013  . 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 TOBACCO ABUSE 03/23/2007    Past Surgical History:  Procedure Laterality Date  . CARDIAC CATHETERIZATION    . EYE SURGERY Left 1990's   "result of trauma"   . FACIAL FRACTURE SURGERY Left 1990's   "result of trauma"   . FRACTURE SURGERY    . HERNIA REPAIR    . LEFT HEART CATHETERIZATION WITH CORONARY  ANGIOGRAM Right 03/07/2013   Procedure: LEFT HEART CATHETERIZATION WITH CORONARY ANGIOGRAM;  Surgeon: [name], MD;  Location: [facility] CATH [facility];  Service: Cardiovascular;  Laterality: Right;  . UMBILICAL HERNIA REPAIR          Home Medications    Prior to Admission medications   Medication Sig Start Date End Date Taking? Authorizing Provider  acetaminophen (TYLENOL) 500 MG tablet Take 1,000 mg by mouth every 4 (four) hours as needed for moderate pain.    [provider]  albuterol (PROVENTIL HFA;VENTOLIN HFA) 108 (90 Base) MCG/ACT inhaler Inhale 2 puffs into the lungs every 6 (six) hours as needed for wheezing or shortness of breath. 08/29/17   Clent Demark, PA-C  famotidine (PEPCID) 20 MG tablet Take 1 tablet (20 mg total) by mouth 2 (two) times daily. 11/16/17   Recardo Evangelist, PA-C  hydrALAZINE (APRESOLINE) 25 MG tablet Take 1 tablet (25 mg total) by mouth every 8 (eight) hours. Patient taking differently: Take 25 mg by mouth at bedtime.  08/29/17   Clent Demark, PA-C  hydrOXYzine (VISTARIL) 50 MG capsule Take 50 mg by mouth 3 (three) times daily as needed for anxiety.     [provider]  lipase/protease/amylase (CREON) 12000 units CPEP capsule Take 1 capsule (12,000 Units total) by mouth 3 (three) times daily with meals. For pancreatitis 10/01/17   Aline August, MD  loratadine (CLARITIN) 10 MG tablet Take 1 tablet (10 mg total) by mouth daily as needed for allergies or rhinitis. (May purchase from over the counter): For allergies 10/01/17   Aline August, MD  metoprolol tartrate (LOPRESSOR) 25 MG tablet Take 1 tablet (25 mg total) by mouth 2 (two) times daily. For high blood pressure 08/29/17   Clent Demark, PA-C  Multiple Vitamin (MULTIVITAMIN WITH MINERALS) TABS tablet Take 1 tablet by mouth daily. 09/06/17   Bonnell Public, MD  ondansetron (ZOFRAN) 4 MG tablet Take 1 tablet (4 mg total) by mouth every 8 (eight) hours as needed for nausea or  vomiting. 11/02/17   McDonald, Mia A, PA-C  pantoprazole (PROTONIX) 40 MG tablet Take 1 tablet (40 mg total) by mouth daily. 08/29/17   Clent Demark, PA-C  sertraline (ZOLOFT) 100 MG tablet Take 1 tablet (100 mg total) by mouth daily. 08/29/17   Clent Demark, PA-C  sucralfate (CARAFATE) 1 g tablet Take 1 tablet (1 g total) by mouth 4 (four) times daily. 11/16/17   Recardo Evangelist, PA-C  omeprazole (PRILOSEC) 20 MG capsule Take 1 capsule (20 mg total) daily by mouth. 03/22/17 06/21/17  Varney Biles, MD    Family History Family History  Problem Relation Age of Onset  . Hypertension Mother   . Cirrhosis Mother   . Alcoholism Mother   . Hypertension Father   . Melanoma Father   . Hypertension Other   . Coronary artery disease Other     Social History Social History   Tobacco Use  . Smoking status: Current Every Day Smoker  Packs/day: 1.00    Years: 33.00    Pack years: 33.00    Types: Cigarettes, E-cigarettes  . Smokeless tobacco: Never Used  Substance Use Topics  . Alcohol use: Yes  . Drug use: Yes    Types: Marijuana, Cocaine    Comment: daily marijuana use; last cocaine use about 3 months ago     Allergies   Robaxin [methocarbamol]; Shellfish-derived products; Trazodone; Trazodone and nefazodone; Contrast media [iodinated diagnostic agents]; and Reglan [metoclopramide]   Review of Systems Review of Systems  Constitutional: Positive for activity change and appetite change. Negative for fever.  HENT: Negative for congestion and rhinorrhea.   Respiratory: Positive for chest tightness and shortness of breath.   Cardiovascular: Positive for chest pain.  Gastrointestinal: Positive for abdominal pain, blood in stool, nausea and vomiting.  Genitourinary: Negative for dysuria and hematuria.  Musculoskeletal: Negative for arthralgias and myalgias.  Skin: Negative for rash.  Neurological: Positive for dizziness, weakness and light-headedness.   all other systems  are negative except as noted in the HPI and PMH.     Physical Exam Updated Vital Signs BP 118/65 (BP Location: Right Arm)   Pulse 85   Temp (!) 97.5 F (36.4 C) (Oral)   Resp 14   SpO2 99%   Physical Exam  Constitutional: He is oriented to person, place, and time. He appears well-developed and well-nourished. No distress.  HENT:  Head: Normocephalic and atraumatic.  Mouth/Throat: Oropharynx is clear and moist. No oropharyngeal exudate.  Mildly dry mucous membranes  Eyes: Pupils are equal, round, and reactive to light. Conjunctivae and EOM are normal.  Neck: Normal range of motion. Neck supple.  No meningismus.  Cardiovascular: Normal rate, regular rhythm, normal heart sounds and intact distal pulses.  No murmur heard. Pulmonary/Chest: Effort normal and breath sounds normal. No respiratory distress.  Abdominal: Soft. There is tenderness. There is no rebound and no guarding.  Epigastric tenderness, no guarding or rebound  Genitourinary:  Genitourinary Comments: Melena on exam, no hemorrhoids or fissures.  Musculoskeletal: Normal range of motion. He exhibits no edema or tenderness.  Neurological: He is alert and oriented to person, place, and time. No cranial nerve deficit. He exhibits normal muscle tone. Coordination normal.  No ataxia on finger to nose bilaterally. No pronator drift. 5/5 strength throughout. CN 2-12 intact.Equal grip strength. Sensation intact.   Skin: Skin is warm.  Psychiatric: He has a normal mood and affect. His behavior is normal.  Nursing note and vitals reviewed.    ED Treatments / Results  Labs (all labs ordered are listed, but only abnormal results are displayed) Labs Reviewed  COMPREHENSIVE METABOLIC PANEL - Abnormal; Notable for the following components:      Result Value   CO2 21 (*)    Glucose, Bld 123 (*)    BUN <5 (*)    Calcium 8.8 (*)    Total Protein 5.6 (*)    Albumin 3.2 (*)    All other components within normal limits  CBC -  Abnormal; Notable for the following components:   RBC 2.34 (*)    Hemoglobin 5.6 (*)    HCT 18.4 (*)    MCH 23.9 (*)    RDW 22.9 (*)    All other components within normal limits  URINALYSIS, ROUTINE W REFLEX MICROSCOPIC - Abnormal; Notable for the following components:   APPearance HAZY (*)    All other components within normal limits  CBC WITH DIFFERENTIAL/PLATELET - Abnormal; Notable for the following components:  WBC 13.1 (*)    RBC 2.45 (*)    Hemoglobin 5.9 (*)    HCT 19.6 (*)    MCH 24.1 (*)    RDW 23.1 (*)    Lymphs Abs 5.2 (*)    All other components within normal limits  IRON AND TIBC - Abnormal; Notable for the following components:   Iron 8 (*)    TIBC 505 (*)    Saturation Ratios 2 (*)    All other components within normal limits  FERRITIN - Abnormal; Notable for the following components:   Ferritin 8 (*)    All other components within normal limits  RETICULOCYTES - Abnormal; Notable for the following components:   Retic Ct Pct 3.2 (*)    RBC. 2.45 (*)    All other components within normal limits  POC OCCULT BLOOD, ED - Abnormal; Notable for the following components:   Fecal Occult Bld POSITIVE (*)    All other components within normal limits  MRSA PCR SCREENING  LIPASE, BLOOD  PROTIME-INR  APTT  ETHANOL  VITAMIN B12  FOLATE  TROPONIN I  BASIC METABOLIC PANEL  CBC  RAPID URINE DRUG SCREEN, HOSP PERFORMED  I-STAT BETA HCG BLOOD, ED ([facility], [facility], [facility] ONLY)  I-STAT TROPONIN, ED  TYPE AND SCREEN  PREPARE RBC (CROSSMATCH)    EKG EKG Interpretation  Date/Time:  Thursday November 23 2017 23:43:10 EDT Ventricular Rate:  86 PR Interval:    QRS Duration: 76 QT Interval:  332 QTC Calculation: 397 R Axis:   74 Text Interpretation:  Sinus rhythm Short PR interval Abnormal R-wave progression, early transition Abnormal T, consider ischemia, anterior leads No significant change was found Confirmed by Ezequiel Essex [phone]) on 11/24/2017 12:20:38 AM   Radiology Dg  Abdomen Acute W/chest  Result Date: 11/23/2017 CLINICAL DATA:  47 year old male with abdominal pain. EXAM: DG ABDOMEN ACUTE W/ 1V CHEST COMPARISON:  Chest radiograph dated 10/19/2017 FINDINGS: The lungs are clear. There is no pleural effusion or pneumothorax. Cardiac silhouette is within normal limits. No bowel dilatation or evidence of obstruction. No free air or radiopaque calculi. The osseous structures and soft tissues appear unremarkable. IMPRESSION: Negative abdominal radiographs.  No acute cardiopulmonary disease. Electronically Signed   By: Anner Crete M.D.   On: 11/23/2017 21:45    Procedures Procedures (including critical care time)  Medications Ordered in ED Medications  sodium chloride 0.9 % bolus 1,000 mL (has no administration in time range)    Followed by  0.9 %  sodium chloride infusion (has no administration in time range)  ondansetron (ZOFRAN) injection 4 mg (has no administration in time range)  gi cocktail (Maalox,Lidocaine,Donnatal) (has no administration in time range)  famotidine (PEPCID) IVPB 20 mg premix (has no administration in time range)  pantoprazole (PROTONIX) 80 mg in sodium chloride 0.9 % 100 mL IVPB (has no administration in time range)  pantoprazole (PROTONIX) 80 mg in sodium chloride 0.9 % 250 mL (0.32 mg/mL) infusion (has no administration in time range)  pantoprazole (PROTONIX) injection 40 mg (has no administration in time range)  0.9 %  sodium chloride infusion (has no administration in time range)     Initial Impression / Assessment and Plan / ED Course  I have reviewed the triage vital signs and the nursing notes.  Pertinent labs & imaging results that were available during my care of the patient were reviewed by me and considered in my medical decision making (see chart for details).    Patient with long  history of chronic abdominal pain and pancreatitis presenting with rectal bleeding and worsening abdominal pain.  Vitals are stable.   Abdomen soft without peritoneal signs.  Does have melena on exam he has dropped his hemoglobin 4 g in the past 6 days.  He is agreeable to blood transfusion.  Will start IV fluids, IV PPI and Pepcid. No EGD reports available in system.  No documented history of varices.  Patient remains he medically stable.  He is given 2 units of packed red blood cells.  Abdominal x-ray is negative for perforation or obstruction. Low suspicion for perforated ulcer.  Plan admission for GI bleed with melena.  Continue IV fluids and Protonix drip as well as blood transfusion.  Discussed with Dr. Tamala Julian.  CRITICAL CARE Performed by: Ezequiel Essex Total critical care time: 45 minutes Critical care time was exclusive of separately billable procedures and treating other patients. Critical care was necessary to treat or prevent imminent or life-threatening deterioration. Critical care was time spent personally by me on the following activities: development of treatment plan with patient and/or surrogate as well as nursing, discussions with consultants, evaluation of patient's response to treatment, examination of patient, obtaining history from patient or surrogate, ordering and performing treatments and interventions, ordering and review of laboratory studies, ordering and review of radiographic studies, pulse oximetry and re-evaluation of patient's condition.   Final Clinical Impressions(s) / ED Diagnoses   Final diagnoses:  Gastrointestinal hemorrhage with melena  Symptomatic anemia    ED Discharge Orders    None       Ayuub Penley, Annie Main, MD 11/24/17 [phone]

## 2017-11-23 NOTE — ED Triage Notes (Signed)
Pt BIB [facility] with abdominal pain and pancreatitis.

## 2017-11-24 DIAGNOSIS — K922 Gastrointestinal hemorrhage, unspecified: Secondary | ICD-10-CM | POA: Diagnosis present

## 2017-11-24 DIAGNOSIS — K861 Other chronic pancreatitis: Secondary | ICD-10-CM

## 2017-11-24 DIAGNOSIS — F1910 Other psychoactive substance abuse, uncomplicated: Secondary | ICD-10-CM

## 2017-11-24 DIAGNOSIS — K921 Melena: Secondary | ICD-10-CM

## 2017-11-24 DIAGNOSIS — D62 Acute posthemorrhagic anemia: Secondary | ICD-10-CM

## 2017-11-24 DIAGNOSIS — R0789 Other chest pain: Secondary | ICD-10-CM

## 2017-11-24 DIAGNOSIS — D72829 Elevated white blood cell count, unspecified: Secondary | ICD-10-CM

## 2017-11-24 LAB — CBC WITH DIFFERENTIAL/PLATELET
BASOS ABS: 0 10*3/uL (ref 0.0–0.1)
Basophils Relative: 0 %
EOS ABS: 0.5 10*3/uL (ref 0.0–0.7)
Eosinophils Relative: 4 %
HEMATOCRIT: 19.6 % — AB (ref 39.0–52.0)
HEMOGLOBIN: 5.9 g/dL — AB (ref 13.0–17.0)
LYMPHS ABS: 5.2 10*3/uL — AB (ref 0.7–4.0)
Lymphocytes Relative: 40 %
MCH: 24.1 pg — ABNORMAL LOW (ref 26.0–34.0)
MCHC: 30.1 g/dL (ref 30.0–36.0)
MCV: 80 fL (ref 78.0–100.0)
MONO ABS: 1 10*3/uL (ref 0.1–1.0)
Monocytes Relative: 8 %
NEUTROS ABS: 6.4 10*3/uL (ref 1.7–7.7)
Neutrophils Relative %: 48 %
Platelets: 286 10*3/uL (ref 150–400)
RBC: 2.45 MIL/uL — AB (ref 4.22–5.81)
RDW: 23.1 % — ABNORMAL HIGH (ref 11.5–15.5)
WBC: 13.1 10*3/uL — AB (ref 4.0–10.5)

## 2017-11-24 LAB — MRSA PCR SCREENING: MRSA by PCR: NEGATIVE

## 2017-11-24 LAB — POC OCCULT BLOOD, ED: Fecal Occult Bld: POSITIVE — AB

## 2017-11-24 LAB — CBC
HCT: 27.2 % — ABNORMAL LOW (ref 39.0–52.0)
HEMATOCRIT: 27.9 % — AB (ref 39.0–52.0)
HEMATOCRIT: 26.5 % — AB (ref 39.0–52.0)
HEMOGLOBIN: 8.8 g/dL — AB (ref 13.0–17.0)
HEMOGLOBIN: 8.8 g/dL — AB (ref 13.0–17.0)
Hemoglobin: 8.4 g/dL — ABNORMAL LOW (ref 13.0–17.0)
MCH: 24.9 pg — ABNORMAL LOW (ref 26.0–34.0)
MCH: 25.1 pg — AB (ref 26.0–34.0)
MCH: 25.2 pg — ABNORMAL LOW (ref 26.0–34.0)
MCHC: 31.5 g/dL (ref 30.0–36.0)
MCHC: 32.4 g/dL (ref 30.0–36.0)
MCHC: 31.7 g/dL (ref 30.0–36.0)
MCV: 78.8 fL (ref 78.0–100.0)
MCV: 77.7 fL — AB (ref 78.0–100.0)
MCV: 79.6 fL (ref 78.0–100.0)
Platelets: 249 10*3/uL (ref 150–400)
Platelets: 257 10*3/uL (ref 150–400)
Platelets: 208 10*3/uL (ref 150–400)
RBC: 3.54 MIL/uL — ABNORMAL LOW (ref 4.22–5.81)
RBC: 3.5 MIL/uL — AB (ref 4.22–5.81)
RBC: 3.33 MIL/uL — ABNORMAL LOW (ref 4.22–5.81)
RDW: 21.2 % — ABNORMAL HIGH (ref 11.5–15.5)
RDW: 21.2 % — ABNORMAL HIGH (ref 11.5–15.5)
RDW: 21.3 % — AB (ref 11.5–15.5)
WBC: 10.4 10*3/uL (ref 4.0–10.5)
WBC: 12.8 10*3/uL — ABNORMAL HIGH (ref 4.0–10.5)
WBC: 11.8 10*3/uL — ABNORMAL HIGH (ref 4.0–10.5)

## 2017-11-24 LAB — IRON AND TIBC
IRON: 8 ug/dL — AB (ref 45–182)
SATURATION RATIOS: 2 % — AB (ref 17.9–39.5)
TIBC: 505 ug/dL — ABNORMAL HIGH (ref 250–450)
UIBC: 497 ug/dL

## 2017-11-24 LAB — I-STAT BETA HCG BLOOD, ED (NOT ORDERABLE)

## 2017-11-24 LAB — RAPID URINE DRUG SCREEN, HOSP PERFORMED
Amphetamines: NOT DETECTED
Benzodiazepines: POSITIVE — AB
COCAINE: NOT DETECTED
Opiates: POSITIVE — AB
Tetrahydrocannabinol: NOT DETECTED

## 2017-11-24 LAB — ETHANOL: Alcohol, Ethyl (B): <10 mg/dL (ref <10)

## 2017-11-24 LAB — TROPONIN I: Troponin I: <0.03 ng/mL (ref <0.03)

## 2017-11-24 LAB — VITAMIN B12: VITAMIN B 12: 730 pg/mL (ref 180–914)

## 2017-11-24 LAB — FERRITIN: Ferritin: 8 ng/mL — ABNORMAL LOW (ref 24–336)

## 2017-11-24 LAB — FOLATE: FOLATE: 10.9 ng/mL (ref >5.9)

## 2017-11-24 MED ORDER — NICOTINE 21 MG/24HR TD PT24
21.0000 mg | MEDICATED_PATCH | Freq: Every day | TRANSDERMAL | Status: DC
  Administered 2017-11-24 – 2017-11-25 (×2): 21 mg via TRANSDERMAL
  Filled 2017-11-24 (×2): qty 1

## 2017-11-24 MED ORDER — LORAZEPAM 1 MG PO TABS: 1.0000 mg | ORAL_TABLET | Freq: Four times a day (QID) | ORAL | Status: DC | PRN

## 2017-11-24 MED ORDER — ONDANSETRON HCL 4 MG PO TABS: 4.0000 mg | ORAL_TABLET | Freq: Four times a day (QID) | ORAL | Status: DC | PRN

## 2017-11-24 MED ORDER — ORAL CARE MOUTH RINSE
15.0000 mL | Freq: Two times a day (BID) | OROMUCOSAL | Status: DC
  Administered 2017-11-25: 15 mL via OROMUCOSAL

## 2017-11-24 MED ORDER — ONDANSETRON HCL 4 MG/2ML IJ SOLN: 4.0000 mg | Freq: Four times a day (QID) | INTRAMUSCULAR | Status: DC | PRN

## 2017-11-24 MED ORDER — SODIUM CHLORIDE 0.9 % IV SOLN
1000.0000 mL | INTRAVENOUS | Status: DC
Start: 2017-11-24 — End: 2017-11-25
  Administered 2017-11-24 – 2017-11-25 (×4): 1000 mL via INTRAVENOUS

## 2017-11-24 MED ORDER — ACETAMINOPHEN 650 MG RE SUPP: 650.0000 mg | Freq: Four times a day (QID) | RECTAL | Status: DC | PRN

## 2017-11-24 MED ORDER — LORAZEPAM 2 MG/ML IJ SOLN
0.0000 mg | Freq: Four times a day (QID) | INTRAMUSCULAR | Status: DC
  Administered 2017-11-24 (×2): 2 mg via INTRAVENOUS
  Administered 2017-11-24: 1 mg via INTRAVENOUS
  Administered 2017-11-24 – 2017-11-25 (×4): 2 mg via INTRAVENOUS
  Filled 2017-11-24 (×7): qty 1

## 2017-11-24 MED ORDER — ACETAMINOPHEN 325 MG PO TABS: 650.0000 mg | ORAL_TABLET | Freq: Four times a day (QID) | ORAL | Status: DC | PRN

## 2017-11-24 MED ORDER — VITAMIN B-1 100 MG PO TABS
100.0000 mg | ORAL_TABLET | Freq: Every day | ORAL | Status: DC
  Administered 2017-11-24 – 2017-11-25 (×3): 100 mg via ORAL
  Filled 2017-11-24 (×3): qty 1

## 2017-11-24 MED ORDER — MORPHINE SULFATE (PF) 4 MG/ML IV SOLN
2.0000 mg | INTRAVENOUS | Status: DC | PRN
  Administered 2017-11-24 – 2017-11-25 (×13): 2 mg via INTRAVENOUS
  Filled 2017-11-24 (×13): qty 1

## 2017-11-24 MED ORDER — CHLORHEXIDINE GLUCONATE 0.12 % MT SOLN
15.0000 mL | Freq: Two times a day (BID) | OROMUCOSAL | Status: DC
  Administered 2017-11-25: 15 mL via OROMUCOSAL

## 2017-11-24 MED ORDER — SODIUM CHLORIDE 0.9 % IV SOLN
510.0000 mg | Freq: Once | INTRAVENOUS | Status: AC
  Administered 2017-11-24: 510 mg via INTRAVENOUS
  Filled 2017-11-24: qty 17

## 2017-11-24 MED ORDER — FENTANYL CITRATE (PF) 100 MCG/2ML IJ SOLN
50.0000 ug | Freq: Once | INTRAMUSCULAR | Status: AC
Start: 2017-11-24 — End: 2017-11-24
  Administered 2017-11-24: 50 ug via INTRAVENOUS
  Filled 2017-11-24: qty 2

## 2017-11-24 MED ORDER — THIAMINE HCL 100 MG/ML IJ SOLN: 100.0000 mg | Freq: Every day | INTRAMUSCULAR | Status: DC

## 2017-11-24 MED ORDER — GI COCKTAIL ~~LOC~~
30.0000 mL | Freq: Three times a day (TID) | ORAL | Status: DC | PRN
  Administered 2017-11-24 (×2): 30 mL via ORAL
  Filled 2017-11-24 (×2): qty 30

## 2017-11-24 MED ORDER — ADULT MULTIVITAMIN W/MINERALS CH
1.0000 | ORAL_TABLET | Freq: Every day | ORAL | Status: DC
  Administered 2017-11-24 – 2017-11-25 (×2): 1 via ORAL
  Filled 2017-11-24 (×2): qty 1

## 2017-11-24 MED ORDER — LORAZEPAM 2 MG/ML IJ SOLN: 1.0000 mg | Freq: Four times a day (QID) | INTRAMUSCULAR | Status: DC | PRN

## 2017-11-24 MED ORDER — SODIUM CHLORIDE 0.9% FLUSH
3.0000 mL | Freq: Two times a day (BID) | INTRAVENOUS | Status: DC
  Administered 2017-11-24 – 2017-11-25 (×2): 3 mL via INTRAVENOUS

## 2017-11-24 MED ORDER — FOLIC ACID 1 MG PO TABS
1.0000 mg | ORAL_TABLET | Freq: Every day | ORAL | Status: DC
  Administered 2017-11-24 – 2017-11-25 (×2): 1 mg via ORAL
  Filled 2017-11-24: qty 1

## 2017-11-24 MED ORDER — LORAZEPAM 2 MG/ML IJ SOLN: 0.0000 mg | Freq: Two times a day (BID) | INTRAMUSCULAR | Status: DC

## 2017-11-24 MED ORDER — METOPROLOL TARTRATE 25 MG PO TABS
25.0000 mg | ORAL_TABLET | Freq: Two times a day (BID) | ORAL | Status: DC
  Administered 2017-11-24 – 2017-11-25 (×4): 25 mg via ORAL
  Filled 2017-11-24 (×3): qty 1

## 2017-11-24 MED ORDER — LORAZEPAM 2 MG/ML IJ SOLN
1.0000 mg | Freq: Once | INTRAMUSCULAR | Status: AC
  Administered 2017-11-24: 1 mg via INTRAVENOUS
  Filled 2017-11-24: qty 1

## 2017-11-24 MED ORDER — ALBUTEROL SULFATE (2.5 MG/3ML) 0.083% IN NEBU: 2.5000 mg | INHALATION_SOLUTION | Freq: Four times a day (QID) | RESPIRATORY_TRACT | Status: DC | PRN

## 2017-11-24 NOTE — Consult Note (Signed)
[facility] CONSULT Reason for consult: GI bleeding Referring Physician: [facility] hospitalist.  PCP: Benita Gutter, F NP.  Primary GI: None patient is unassigned  [name] is an 47 y.o. male.  HPI: Patient has a long history of chronic pancreatitis and alcoholism.  He states he is been taking pain medicine for 5 years and is seen multiple doctors to obtain the pain medicine.  He insists that he is not hooked on opioids.  He takes the pain medicine only for his chronic pain from his pancreatitis.  It appears that he obtains most of his care from the emergency room and has had multiple CT scans during these ER visits the last 11/18 showing calcifications in the pancreas with small cystic lesions in the tail and body.  This was felt to be consistent with very mild acute pancreatitis superimposed on chronic pancreatitis with small pseudocyst that is been seen on previous scans.  He did have sludge in the gallbladder as well.  The patient has continued to drink.  None of the CT scans nor ultrasounds done recently have shown signs of cirrhosis.  Doppler ultrasound 10/18 showed lesions of cyst in the pancreas normal-appearing liver with hepato-pedal flow and no signs of cirrhosis.  The patient's liver test on admission were basically normal other than an albumin of 3.2.  Bilirubin and INR were normal.  The patient has had abdominal pain and he takes Protonix and ranitidine and Carafate all of which have been given to him apparently to the emergency room in the past.  He states that he has been taking all of these medications.  He apparently has been taking oxycodone 10 mg several times daily for his pain and ran out of pills recently and began to take large quantities of ibuprofen.  He has had some loose stools that he reported for maroon and reddish colored.  Prior to this they have been coffee-ground.  He has not had any further maroonish stools since his admission.  His BUN on admission was  normal as was his creatinine.  He reported that his last drink was 2 or 3 days ago.  Past Medical History:  Diagnosis Date  . Alcoholism /alcohol abuse ([place])   . Anemia   . Anxiety   . Arthritis    "knees; arms; elbows" (03/26/2015)  . Asthma   . Bipolar disorder ([place])   . Chronic bronchitis ([place])   . Chronic lower back pain   . Chronic pancreatitis ([place])   . Cocaine abuse ([place])   . Depression   . Family history of adverse reaction to anesthesia    "grandmother gets confused"  . Femoral condyle fracture ([place]) 03/08/2014   left medial/notes 03/09/2014  . GERD (gastroesophageal reflux disease)   . H/O hiatal hernia   . H/O suicide attempt 10/2012  . Heart murmur    "when he was little" (03/06/2013)  . High cholesterol   . History of blood transfusion 10/2012   "when I tried to commit suicide"  . History of stomach ulcers   . Hypertension   . Marijuana abuse, continuous   . Migraine    "a few times/year" (03/26/2015)  . Pneumonia 1990's X 3  . PTSD (post-traumatic stress disorder)   . Shortness of breath    "can happen at anytime" (03/06/2013)  . Sickle cell trait ([place])   . WPW (Wolff-Parkinson-White syndrome)    [name] 03/06/2013    Past Surgical History:  Procedure Laterality Date  . CARDIAC CATHETERIZATION    .  EYE SURGERY Left 1990's   "result of trauma"   . FACIAL FRACTURE SURGERY Left 1990's   "result of trauma"   . FRACTURE SURGERY    . HERNIA REPAIR    . LEFT HEART CATHETERIZATION WITH CORONARY ANGIOGRAM Right 03/07/2013   Procedure: LEFT HEART CATHETERIZATION WITH CORONARY ANGIOGRAM;  Surgeon: [name], MD;  Location: [facility];  Service: Cardiovascular;  Laterality: Right;  . UMBILICAL HERNIA REPAIR      Family History  Problem Relation Age of Onset  . Hypertension Mother   . Cirrhosis Mother   . Alcoholism Mother   . Hypertension Father   . Melanoma Father   . Hypertension Other   . Coronary artery disease Other     Social History:   reports that he has been smoking cigarettes and e-cigarettes.  He has a 33.00 pack-year smoking history. He has never used smokeless tobacco. He reports that he drinks alcohol. He reports that he has current or past drug history. Drugs: Marijuana and Cocaine.  Allergies:  Allergies  Allergen Reactions  . Robaxin [Methocarbamol] Other (See Comments)    "jumpy limbs"  . Shellfish-Derived Products Nausea And Vomiting  . Trazodone Other (See Comments)    Muscle spasms  . Trazodone And Nefazodone Other (See Comments)    Muscle spasms  . Contrast Media [Iodinated Diagnostic Agents] Hives  . Reglan [Metoclopramide] Other (See Comments)    Muscle spasms    Medications; Prior to Admission medications   Medication Sig Start Date End Date Taking? Authorizing Provider  acetaminophen (TYLENOL) 500 MG tablet Take 1,000 mg by mouth every 4 (four) hours as needed for moderate pain.   Yes [provider]  albuterol (PROVENTIL HFA;VENTOLIN HFA) 108 (90 Base) MCG/ACT inhaler Inhale 2 puffs into the lungs every 6 (six) hours as needed for wheezing or shortness of breath. 08/29/17  Yes Clent Demark, PA-C  famotidine (PEPCID) 20 MG tablet Take 1 tablet (20 mg total) by mouth 2 (two) times daily. 11/16/17  Yes Recardo Evangelist, PA-C  hydrALAZINE (APRESOLINE) 25 MG tablet Take 1 tablet (25 mg total) by mouth every 8 (eight) hours. Patient taking differently: Take 25 mg by mouth at bedtime.  08/29/17  Yes Clent Demark, PA-C  hydrOXYzine (VISTARIL) 50 MG capsule Take 50 mg by mouth 3 (three) times daily as needed for anxiety.    Yes [provider]  lipase/protease/amylase (CREON) 12000 units CPEP capsule Take 1 capsule (12,000 Units total) by mouth 3 (three) times daily with meals. For pancreatitis 10/01/17  Yes Aline August, MD  loratadine (CLARITIN) 10 MG tablet Take 1 tablet (10 mg total) by mouth daily as needed for allergies or rhinitis. (May purchase from over the counter): For  allergies 10/01/17  Yes Aline August, MD  metoprolol tartrate (LOPRESSOR) 25 MG tablet Take 1 tablet (25 mg total) by mouth 2 (two) times daily. For high blood pressure 08/29/17  Yes Clent Demark, PA-C  Multiple Vitamin (MULTIVITAMIN WITH MINERALS) TABS tablet Take 1 tablet by mouth daily. 09/06/17  Yes Bonnell Public, MD  ondansetron (ZOFRAN) 4 MG tablet Take 1 tablet (4 mg total) by mouth every 8 (eight) hours as needed for nausea or vomiting. 11/02/17  Yes McDonald, Mia A, PA-C  pantoprazole (PROTONIX) 40 MG tablet Take 1 tablet (40 mg total) by mouth daily. 08/29/17  Yes Clent Demark, PA-C  sertraline (ZOLOFT) 100 MG tablet Take 1 tablet (100 mg total) by mouth daily. 08/29/17  Yes  Clent Demark, PA-C  sucralfate (CARAFATE) 1 g tablet Take 1 tablet (1 g total) by mouth 4 (four) times daily. 11/16/17  Yes Recardo Evangelist, PA-C  omeprazole (PRILOSEC) 20 MG capsule Take 1 capsule (20 mg total) daily by mouth. 03/22/17 06/21/17  Varney Biles, MD   . folic acid  1 mg Oral Daily  . LORazepam  0-4 mg Intravenous Q6H   Followed by  . [START ON 11/26/2017] LORazepam  0-4 mg Intravenous Q12H  . metoprolol tartrate  25 mg Oral BID  . multivitamin with minerals  1 tablet Oral Daily  . nicotine  21 mg Transdermal Daily  . [START ON 11/27/2017] pantoprazole  40 mg Intravenous Q12H  . sodium chloride flush  3 mL Intravenous Q12H  . thiamine  100 mg Oral Daily   Or  . thiamine  100 mg Intravenous Daily   PRN Meds acetaminophen **OR** acetaminophen, albuterol, gi cocktail, LORazepam **OR** LORazepam, morphine injection, ondansetron **OR** ondansetron (ZOFRAN) IV Results for orders placed or performed during the hospital encounter of 11/23/17 (from the past 48 hour(s))  Lipase, blood     Status: None   Collection Time: 11/23/17  6:47 PM  Result Value Ref Range   Lipase 46 11 - 51 U/L    Comment: Performed at [facility], [place] [address], [place], [place] [address]  I-Stat  beta hCG blood, ED ([facility], [facility], [facility] only)     Status: None   Collection Time: 11/23/17  7:12 PM  Result Value Ref Range   I-stat hCG, quantitative <5.0 <5 mIU/mL   Comment 3            Comment:   GEST. AGE      CONC.  (mIU/mL)   <=1 WEEK        5 - 50     2 WEEKS       50 - 500     3 WEEKS       100 - 10,000     4 WEEKS     1,000 - 30,000        MALE AND NON-PREGNANT MALE:     LESS THAN 5 mIU/mL   Comprehensive metabolic panel     Status: Abnormal   Collection Time: 11/23/17  7:35 PM  Result Value Ref Range   Sodium 139 135 - 145 mmol/L   Potassium 4.0 3.5 - 5.1 mmol/L   Chloride 109 98 - 111 mmol/L    Comment: Please note change in reference range.   CO2 21 (L) 22 - 32 mmol/L   Glucose, Bld 123 (H) 70 - 99 mg/dL    Comment: Please note change in reference range.   BUN <5 (L) 6 - 20 mg/dL    Comment: Please note change in reference range.   Creatinine, Ser 0.72 0.61 - 1.24 mg/dL   Calcium 8.8 (L) 8.9 - 10.3 mg/dL   Total Protein 5.6 (L) 6.5 - 8.1 g/dL   Albumin 3.2 (L) 3.5 - 5.0 g/dL   AST 40 15 - 41 U/L   ALT 20 0 - 44 U/L    Comment: Please note change in reference range.   Alkaline Phosphatase 57 38 - 126 U/L   Total Bilirubin 0.3 0.3 - 1.2 mg/dL   GFR calc non Af Amer >60 >60 mL/min   GFR calc Af Amer >60 >60 mL/min    Comment: (NOTE) The eGFR has been calculated using the CKD EPI equation. This calculation has  not been validated in all clinical situations. eGFR's persistently <60 mL/min signify possible Chronic Kidney Disease.    Anion gap 9 5 - 15    Comment: Performed at [facility] [address], [place], [place] [address]  CBC     Status: Abnormal   Collection Time: 11/23/17  7:35 PM  Result Value Ref Range   WBC 10.3 4.0 - 10.5 K/uL   RBC 2.34 (L) 4.22 - 5.81 MIL/uL   Hemoglobin 5.6 (LL) 13.0 - 17.0 g/dL    Comment: REPEATED TO VERIFY SPECIMEN CHECKED FOR CLOTS E OLSTON,RN 2007 11/23/2017 [facility] CRITICAL RESULT CALLED TO, READ BACK BY AND VERIFIED  WITH: CORRECTED ON 07/18 AT 2121: PREVIOUSLY REPORTED AS 5.6 REPEATED TO VERIFY SPECIMEN CHECKED FOR CLOTS E OLSTON,RN 2007 11/23/2017 [facility]    HCT 18.4 (L) 39.0 - 52.0 %   MCV 78.6 78.0 - 100.0 fL   MCH 23.9 (L) 26.0 - 34.0 pg   MCHC 30.4 30.0 - 36.0 g/dL   RDW 22.9 (H) 11.5 - 15.5 %   Platelets 276 150 - 400 K/uL    Comment: Performed at [facility], [place] [address], [place], [place] [address]  Urinalysis, Routine w reflex microscopic     Status: Abnormal   Collection Time: 11/23/17 10:05 PM  Result Value Ref Range   Color, Urine YELLOW YELLOW   APPearance HAZY (A) CLEAR   Specific Gravity, Urine 1.015 1.005 - 1.030   pH 5.0 5.0 - 8.0   Glucose, UA NEGATIVE NEGATIVE mg/dL   Hgb urine dipstick NEGATIVE NEGATIVE   Bilirubin Urine NEGATIVE NEGATIVE   Ketones, ur NEGATIVE NEGATIVE mg/dL   Protein, ur NEGATIVE NEGATIVE mg/dL   Nitrite NEGATIVE NEGATIVE   Leukocytes, UA NEGATIVE NEGATIVE    Comment: Performed at [facility] [address], [place], [place] [address]  Type and screen [place]     Status: None (Preliminary result)   Collection Time: 11/23/17 10:05 PM  Result Value Ref Range   ABO/RH(D) B POS    Antibody Screen NEG    Sample Expiration 11/26/2017    Unit Number [id number]    Blood Component Type RED CELLS,LR    Unit division 00    Status of Unit ALLOCATED    Transfusion Status OK TO TRANSFUSE    Crossmatch Result Compatible    Unit Number [id number]    Blood Component Type RED CELLS,LR    Unit division 00    Status of Unit ALLOCATED    Transfusion Status OK TO TRANSFUSE    Crossmatch Result Compatible    Unit Number [id number]    Blood Component Type RED CELLS,LR    Unit division 00    Status of Unit ISSUED    Transfusion Status OK TO TRANSFUSE    Crossmatch Result Compatible    Unit Number [id number]    Blood Component Type RED CELLS,LR    Unit division 00    Status of Unit ISSUED    Transfusion Status OK TO  TRANSFUSE    Crossmatch Result      Compatible Performed at [facility], [place] [address], [place], [place] [address]   Protime-INR     Status: None   Collection Time: 11/23/17 10:09 PM  Result Value Ref Range   Prothrombin Time 12.7 11.4 - 15.2 seconds   INR 0.96     Comment: Performed at [facility] [address], [place], [place] [address]  APTT     Status: None   Collection Time: 11/23/17 10:09 PM  Result Value Ref Range   aPTT 30 24 - 36 seconds    Comment: Performed at [facility], [place] [address], [place], [place] [address]  Prepare RBC     Status: None   Collection Time: 11/23/17 11:22 PM  Result Value Ref Range   Order Confirmation      ORDER PROCESSED BY BLOOD BANK Performed at [facility], [place] [address], [place], [place] [address]   CBC with Differential/Platelet     Status: Abnormal   Collection Time: 11/23/17 11:30 PM  Result Value Ref Range   WBC 13.1 (H) 4.0 - 10.5 K/uL   RBC 2.45 (L) 4.22 - 5.81 MIL/uL   Hemoglobin 5.9 (LL) 13.0 - 17.0 g/dL    Comment: REPEATED TO VERIFY CRITICAL RESULT CALLED TO, READ BACK BY AND VERIFIED WITH: E. ATKINS RN 2356 11/23/17 [facility]    HCT 19.6 (L) 39.0 - 52.0 %   MCV 80.0 78.0 - 100.0 fL   MCH 24.1 (L) 26.0 - 34.0 pg   MCHC 30.1 30.0 - 36.0 g/dL   RDW 23.1 (H) 11.5 - 15.5 %   Platelets 286 150 - 400 K/uL   Neutrophils Relative % 48 %   Lymphocytes Relative 40 %   Monocytes Relative 8 %   Eosinophils Relative 4 %   Basophils Relative 0 %   Neutro Abs 6.4 1.7 - 7.7 K/uL   Lymphs Abs 5.2 (H) 0.7 - 4.0 K/uL   Monocytes Absolute 1.0 0.1 - 1.0 K/uL   Eosinophils Absolute 0.5 0.0 - 0.7 K/uL   Basophils Absolute 0.0 0.0 - 0.1 K/uL   RBC Morphology POLYCHROMASIA PRESENT     Comment: TARGET CELLS Performed at [facility], [address], [place], [place] [address]   Ethanol     Status: None   Collection Time: 11/23/17 11:30 PM  Result Value Ref Range   Alcohol, Ethyl (B) <10 <10 mg/dL     Comment: (NOTE) Lowest detectable limit for serum alcohol is 10 mg/dL. For medical purposes only. Performed at [facility], [place] [address], [place], [place] [address]   Vitamin B12     Status: None   Collection Time: 11/23/17 11:30 PM  Result Value Ref Range   Vitamin B-12 730 180 - 914 pg/mL    Comment: (NOTE) This assay is not validated for testing neonatal or myeloproliferative syndrome specimens for Vitamin B12 levels. Performed at [facility], [place] [address] [place], [place] [address]   Folate     Status: None   Collection Time: 11/23/17 11:30 PM  Result Value Ref Range   Folate 10.9 >5.9 ng/mL    Comment: Performed at [facility] [address] [place], [place] [address]  Iron and TIBC     Status: Abnormal   Collection Time: 11/23/17 11:30 PM  Result Value Ref Range   Iron 8 (L) 45 - 182 ug/dL   TIBC 505 (H) 250 - 450 ug/dL   Saturation Ratios 2 (L) 17.9 - 39.5 %   UIBC 497 ug/dL    Comment: Performed at [facility] [address], [place],  [address]  Ferritin     Status: Abnormal   Collection Time: 11/23/17 11:30 PM  Result Value Ref Range   Ferritin 8 (L) 24 - 336 ng/mL    Comment: Performed at [facility], [address] [place], [place] [address]  Reticulocytes     Status: Abnormal   Collection Time: 11/23/17 11:30 PM  Result Value Ref Range   Retic Ct Pct 3.2 (H) 0.4 - 3.1 %   RBC. 2.45 (L) 4.22 - 5.81 MIL/uL   Retic Count, Absolute 78.4 19.0 - 186.0 K/uL    Comment: Performed at [facility] [address], [place], [place] [address]  Troponin I     Status: None   Collection Time: 11/23/17 11:30 PM  Result Value Ref Range   Troponin I <0.03 <0.03 ng/mL    Comment: Performed at [facility] [address] [place], [place] [address]  I-stat troponin, ED     Status: None   Collection Time: 11/23/17 11:31 PM  Result Value Ref Range   Troponin i, poc 0.00 0.00 - 0.08 ng/mL   Comment 3             Comment: Due to the release kinetics of cTnI, a negative result within the first hours of the onset of symptoms does not rule out myocardial infarction with certainty. If myocardial infarction is still suspected, repeat the test at appropriate intervals.   POC occult blood, ED     Status: Abnormal   Collection Time: 11/24/17 12:20 AM  Result Value Ref Range   Fecal Occult Bld POSITIVE (A) NEGATIVE  MRSA PCR Screening     Status: None   Collection Time: 11/24/17  3:07 AM  Result Value Ref Range   MRSA by PCR NEGATIVE NEGATIVE    Comment:        The GeneXpert MRSA Assay (FDA approved for NASAL specimens only), is one component of a comprehensive MRSA colonization surveillance program. It is not intended to diagnose MRSA infection nor to guide or monitor treatment for MRSA infections. Performed at [facility], [place] [address] [place], [place] [address]   CBC     Status: Abnormal   Collection Time: 11/24/17 10:00 AM  Result Value Ref Range   WBC 10.4 4.0 - 10.5 K/uL   RBC 3.54 (L) 4.22 - 5.81 MIL/uL   Hemoglobin 8.8 (L) 13.0 - 17.0 g/dL   HCT 27.9 (L) 39.0 - 52.0 %   MCV 78.8 78.0 - 100.0 fL   MCH 24.9 (L) 26.0 - 34.0 pg   MCHC 31.5 30.0 - 36.0 g/dL   RDW 21.2 (H) 11.5 - 15.5 %   Platelets 249 150 - 400 K/uL    Comment: Performed at [facility], [place] [address] [place], [place] [address]  Urine rapid drug screen (hosp performed)     Status: Abnormal   Collection Time: 11/24/17 10:14 AM  Result Value Ref Range   Opiates POSITIVE (A) NONE DETECTED   Cocaine NONE DETECTED NONE DETECTED   Benzodiazepines POSITIVE (A) NONE DETECTED   Amphetamines NONE DETECTED NONE DETECTED   Tetrahydrocannabinol NONE DETECTED NONE DETECTED   Barbiturates (A) NONE DETECTED    Result not available. Reagent lot number recalled by manufacturer.    Comment: Performed at [facility], [place] [address], [place], [place] [address]    Dg Abdomen Acute W/chest  Result Date:  11/23/2017 CLINICAL DATA:  47 year old male with abdominal pain. EXAM: DG ABDOMEN ACUTE W/ 1V CHEST COMPARISON:  Chest radiograph dated 10/19/2017 FINDINGS: The lungs are clear. There is no pleural effusion or pneumothorax. Cardiac silhouette is within normal limits. No bowel dilatation or evidence of obstruction. No  free air or radiopaque calculi. The osseous structures and soft tissues appear unremarkable. IMPRESSION: Negative abdominal radiographs.  No acute cardiopulmonary disease. Electronically Signed   By: Anner Crete M.D.   On: 11/23/2017 21:45               Blood pressure 111/82, pulse 89, temperature 97.8 F (36.6 C), temperature source Oral, resp. rate 19, height _0  (1.753 m), SpO2 100 %.  Physical exam:   General--alert [name] in no distress ENT--nonicteric Neck--no lymphadenopathy Heart--regular rate and rhythm without murmurs or gallops Lungs--clear Abdomen--nondistended with mild upper tenderness Psych--alert and oriented.  Is concerned that he is not receiving enough pain medications   Assessment: 1.  GI bleed.  This appears most consistent with a lower GI bleed.  He has a normal BUN and reports that he has been taking Protonix and ranitidine on a regular basis for heartburn.  He has been consuming large quantities of Advil so it is possible he could have had an ulcer that bled several days ago.  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 2.  Chronic pancreatitis with chronic pseudocyst 3.  Chronic pain with narcotic dependence 4.  History of Wolff-Parkinson-White syndrome 5. GERD.  Has been on outpatient pantoprazole and ranitidine 6.  History of depression and bipolar disorder 7.  History of multiple substance abuse including opioids, marijuana and cocaine   Plan: Agree with continuing patient on PPI therapy for now.  We have scheduled him  for EGD with propofol sedation by Dr. Paulita Fujita at 9:45 in the morning.  We will allow clear liquids only today and n.p.o. after midnight.   Nancy Fetter 11/24/2017, 10:54 AM   This note was created using voice recognition software and minor errors may Have occurred unintentionally. Pager: [phone] If no answer or after hours call [phone]

## 2017-11-24 NOTE — Progress Notes (Signed)
Pharmacy asked to dose feraheme for iron deficiency. Ferritin is 8 and Transferrin saturation is 2%. Will dose 510mg  Feraheme IV x 1 for now. Patient has received blood products which contain iron, therefore may not need an additional dose of feraheme.     Thank you for the consult.   Pattie Flaharty A. Levada Dy, PharmD, [place] Pager: [phone] Please utilize Amion for appropriate phone number to reach the unit pharmacist ([place])

## 2017-11-24 NOTE — Progress Notes (Signed)
Pt refusing bed alarm. Elmyra Ricks, dayshift nurse states that pt refused on dayshift and assistant director [place] aware. Will continue to monitor pt. Ranelle Oyster, RN

## 2017-11-24 NOTE — H&P (Signed)
History and Physical    [name] NOM:[id number] DOB: 1971/03/22 DOA: 11/23/2017  Referring MD/NP/PA: Ezequiel Essex, MD PCP: Marliss Coots, NP  Patient coming from: via EMS  Chief Complaint: Abdominal pain  I have personally briefly reviewed patient's old medical records in [place]   HPI: [name] is a 47 y.o. male with medical history significant of HTN, alcohol abuse, chronic pancreatitis, WPW, anxiety; who presents with complaints of left sided abdominal pain.  He notes similar symptoms previously in the past related to history of chronic pancreatitis despite lipase levels.  He had recently ran out of oxycodone 10 mg pills to take for symptoms and been using Tylenol and ibuprofen to treat his symptoms at home.  Yesterday, he began having left abdominal pain with multiple episodes of vomiting.  Some of the emesis was noted to be red-colored, but for some reason patient did not believe that it was blood.  Thereafter, patient reports having up to 7 bowel movements before taking Imodium with improvement of symptoms.  Stools were noted to be coffee-ground in color and when he wiped noted a maroon color that concern to him for blood.  Other associated symptoms included right-sided chest pain, headache, diaphoresis, and lightheadedness.  He denies having any loss of consciousness.  Last alcoholic drink was approximately 2 days ago.  ED Course: Upon admission into the emergency department patient was seen to be afebrile with vital signs relatively within normal limits.  Labs revealed a initial hemoglobin of 5.6, lipase 46, INR 0.96, with guaiac positive stools.  Alcohol level was undetectable.  Patient was given 1 L of normal saline IV fluids, Ativan 1 mg IV, fentanyl 50 mcg IV, Pepcid IV, Zofran, and started on Protonix drip.   Review of Systems  Constitutional: Positive for diaphoresis and malaise/fatigue. Negative for chills, fever and weight loss.  HENT:  Negative for congestion and nosebleeds.   Eyes: Negative for photophobia and pain.  Respiratory: Negative for cough and shortness of breath.   Cardiovascular: Positive for chest pain. Negative for leg swelling.  Gastrointestinal: Positive for abdominal pain, blood in stool, diarrhea, nausea and vomiting.  Genitourinary: Negative for dysuria and frequency.  Musculoskeletal: Negative for falls and myalgias.  Skin: Negative for itching and rash.  Neurological: Positive for dizziness. Negative for focal weakness and loss of consciousness.  Endo/Heme/Allergies: Negative for polydipsia.  Psychiatric/Behavioral: Positive for substance abuse. Negative for memory loss and suicidal ideas.    Past Medical History:  Diagnosis Date  . Alcoholism /alcohol abuse ([place])   . Anemia   . Anxiety   . Arthritis    "knees; arms; elbows" (03/26/2015)  . Asthma   . Bipolar disorder ([place])   . Chronic bronchitis ([place])   . Chronic lower back pain   . Chronic pancreatitis ([place])   . Cocaine abuse ([place])   . Depression   . Family history of adverse reaction to anesthesia    "grandmother gets confused"  . Femoral condyle fracture ([place]) 03/08/2014   left medial/notes 03/09/2014  . GERD (gastroesophageal reflux disease)   . H/O hiatal hernia   . H/O suicide attempt 10/2012  . Heart murmur    "when he was little" (03/06/2013)  . High cholesterol   . History of blood transfusion 10/2012   "when I tried to commit suicide"  . History of stomach ulcers   . Hypertension   . Marijuana abuse, continuous   . Migraine    "a few times/year" (03/26/2015)  .  Pneumonia 1990's X 3  . PTSD (post-traumatic stress disorder)   . Shortness of breath    "can happen at anytime" (03/06/2013)  . Sickle cell trait ([place])   . WPW (Wolff-Parkinson-White syndrome)    [name] 03/06/2013    Past Surgical History:  Procedure Laterality Date  . CARDIAC CATHETERIZATION    . EYE SURGERY Left 1990's   "result of trauma"   . FACIAL  FRACTURE SURGERY Left 1990's   "result of trauma"   . FRACTURE SURGERY    . HERNIA REPAIR    . LEFT HEART CATHETERIZATION WITH CORONARY ANGIOGRAM Right 03/07/2013   Procedure: LEFT HEART CATHETERIZATION WITH CORONARY ANGIOGRAM;  Surgeon: [name], MD;  Location: [facility];  Service: Cardiovascular;  Laterality: Right;  . UMBILICAL HERNIA REPAIR       reports that he has been smoking cigarettes and e-cigarettes.  He has a 33.00 pack-year smoking history. He has never used smokeless tobacco. He reports that he drinks alcohol. He reports that he has current or past drug history. Drugs: Marijuana and Cocaine.  Allergies  Allergen Reactions  . Robaxin [Methocarbamol] Other (See Comments)    "jumpy limbs"  . Shellfish-Derived Products Nausea And Vomiting  . Trazodone Other (See Comments)    Muscle spasms  . Trazodone And Nefazodone Other (See Comments)    Muscle spasms  . Contrast Media [Iodinated Diagnostic Agents] Hives  . Reglan [Metoclopramide] Other (See Comments)    Muscle spasms    Family History  Problem Relation Age of Onset  . Hypertension Mother   . Cirrhosis Mother   . Alcoholism Mother   . Hypertension Father   . Melanoma Father   . Hypertension Other   . Coronary artery disease Other     Prior to Admission medications   Medication Sig Start Date End Date Taking? Authorizing Provider  acetaminophen (TYLENOL) 500 MG tablet Take 1,000 mg by mouth every 4 (four) hours as needed for moderate pain.    [provider]  albuterol (PROVENTIL HFA;VENTOLIN HFA) 108 (90 Base) MCG/ACT inhaler Inhale 2 puffs into the lungs every 6 (six) hours as needed for wheezing or shortness of breath. 08/29/17   Clent Demark, PA-C  famotidine (PEPCID) 20 MG tablet Take 1 tablet (20 mg total) by mouth 2 (two) times daily. 11/16/17   Recardo Evangelist, PA-C  hydrALAZINE (APRESOLINE) 25 MG tablet Take 1 tablet (25 mg total) by mouth every 8 (eight) hours. Patient taking  differently: Take 25 mg by mouth at bedtime.  08/29/17   Clent Demark, PA-C  hydrOXYzine (VISTARIL) 50 MG capsule Take 50 mg by mouth 3 (three) times daily as needed for anxiety.     [provider]  lipase/protease/amylase (CREON) 12000 units CPEP capsule Take 1 capsule (12,000 Units total) by mouth 3 (three) times daily with meals. For pancreatitis 10/01/17   Aline August, MD  loratadine (CLARITIN) 10 MG tablet Take 1 tablet (10 mg total) by mouth daily as needed for allergies or rhinitis. (May purchase from over the counter): For allergies 10/01/17   Aline August, MD  metoprolol tartrate (LOPRESSOR) 25 MG tablet Take 1 tablet (25 mg total) by mouth 2 (two) times daily. For high blood pressure 08/29/17   Clent Demark, PA-C  Multiple Vitamin (MULTIVITAMIN WITH MINERALS) TABS tablet Take 1 tablet by mouth daily. 09/06/17   Bonnell Public, MD  ondansetron (ZOFRAN) 4 MG tablet Take 1 tablet (4 mg total) by mouth every 8 (eight)  hours as needed for nausea or vomiting. 11/02/17   McDonald, Mia A, PA-C  pantoprazole (PROTONIX) 40 MG tablet Take 1 tablet (40 mg total) by mouth daily. 08/29/17   Clent Demark, PA-C  sertraline (ZOLOFT) 100 MG tablet Take 1 tablet (100 mg total) by mouth daily. 08/29/17   Clent Demark, PA-C  sucralfate (CARAFATE) 1 g tablet Take 1 tablet (1 g total) by mouth 4 (four) times daily. 11/16/17   Recardo Evangelist, PA-C  omeprazole (PRILOSEC) 20 MG capsule Take 1 capsule (20 mg total) daily by mouth. 03/22/17 06/21/17  Varney Biles, MD    Physical Exam:  Constitutional: NAD, calm, comfortable Vitals:   11/23/17 2207 11/23/17 2325 11/24/17 0000 11/24/17 0006  BP: 118/65 103/78  117/73  Pulse: 85  83 87  Resp: 14  17 16   Temp: (!) 97.5 F (36.4 C)   97.9 F (36.6 C)  TempSrc: Oral   Oral  SpO2: 99%  100% 100%   Eyes: PERRL, lids and conjunctivae normal ENMT: Mucous membranes are moist. Posterior pharynx clear of any exudate or lesions.    Neck: normal, supple, no masses, no thyromegaly Respiratory: clear to auscultation bilaterally, no wheezing, no crackles. Normal respiratory effort. No accessory muscle use.  Cardiovascular: Regular rate and rhythm, no murmurs / rubs / gallops. No extremity edema. 2+ pedal pulses. No carotid bruits.  Abdomen: Left sided abdominal tenderness, no masses palpated. No hepatosplenomegaly. Bowel sounds positive.  Musculoskeletal: no clubbing / cyanosis. No joint deformity upper and lower extremities. Good ROM, no contractures. Normal muscle tone.  Skin: no rashes, lesions, ulcers. No induration Neurologic: CN 2-12 grossly intact. Sensation intact, DTR normal. Strength 5/5 in all 4.  Psychiatric: Normal judgment and insight. Alert and oriented x 3. Normal mood.      Labs on Admission: I have personally reviewed following labs and imaging studies  CBC: Recent Labs  Lab 11/17/17 1810 11/18/17 1355 11/23/17 1935 11/23/17 2330  WBC 9.6 11.0* 10.3 13.1*  NEUTROABS  --  7.9*  --  6.4  HGB 9.8* 9.5* 5.6* 5.9*  HCT 31.9* 31.6* 18.4* 19.6*  MCV 76.9* 79.6 78.6 80.0  PLT 236 228 276 500   Basic Metabolic Panel: Recent Labs  Lab 11/17/17 1810 11/18/17 1355 11/23/17 1935  NA 136 136 139  K 3.7 3.8 4.0  CL 102 105 109  CO2 24 19* 21*  GLUCOSE 121* 92 123*  BUN 9 8 <5*  CREATININE 0.86 0.58* 0.72  CALCIUM 11.0* 10.0 8.8*   GFR: Estimated Creatinine Clearance: 105.1 mL/min (by C-G formula based on SCr of 0.72 mg/dL). Liver Function Tests: Recent Labs  Lab 11/17/17 1810 11/18/17 1355 11/23/17 1935  AST 30 25 40  ALT 24 18 20   ALKPHOS 77 71 57  BILITOT 1.0 0.7 0.3  PROT 7.7 6.8 5.6*  ALBUMIN 4.0 3.6 3.2*   Recent Labs  Lab 11/17/17 1810 11/18/17 1355 11/23/17 1847  LIPASE 55* 43 46   No results for input(s): AMMONIA in the last 168 hours. Coagulation Profile: Recent Labs  Lab 11/23/17 2209  INR 0.96   Cardiac Enzymes: Recent Labs  Lab 11/23/17 2330  TROPONINI  <0.03   BNP (last 3 results) No results for input(s): PROBNP in the last 8760 hours. HbA1C: No results for input(s): HGBA1C in the last 72 hours. CBG: No results for input(s): GLUCAP in the last 168 hours. Lipid Profile: No results for input(s): CHOL, HDL, LDLCALC, TRIG, CHOLHDL, LDLDIRECT in the last 72 hours.  Thyroid Function Tests: No results for input(s): TSH, T4TOTAL, FREET4, T3FREE, THYROIDAB in the last 72 hours. Anemia Panel: Recent Labs    11/23/17 2330  RETICCTPCT 3.2*   Urine analysis:    Component Value Date/Time   COLORURINE YELLOW 11/23/2017 2205   APPEARANCEUR HAZY (A) 11/23/2017 2205   LABSPEC 1.015 11/23/2017 2205   PHURINE 5.0 11/23/2017 2205   GLUCOSEU NEGATIVE 11/23/2017 2205   HGBUR NEGATIVE 11/23/2017 2205   HGBUR negative 04/30/2010 1020   BILIRUBINUR NEGATIVE 11/23/2017 2205   KETONESUR NEGATIVE 11/23/2017 2205   PROTEINUR NEGATIVE 11/23/2017 2205   UROBILINOGEN 0.2 03/15/2015 0508   NITRITE NEGATIVE 11/23/2017 2205   LEUKOCYTESUR NEGATIVE 11/23/2017 2205   Sepsis Labs: No results found for this or any previous visit (from the past 240 hour(s)).   Radiological Exams on Admission: Dg Abdomen Acute W/chest  Result Date: 11/23/2017 CLINICAL DATA:  47 year old male with abdominal pain. EXAM: DG ABDOMEN ACUTE W/ 1V CHEST COMPARISON:  Chest radiograph dated 10/19/2017 FINDINGS: The lungs are clear. There is no pleural effusion or pneumothorax. Cardiac silhouette is within normal limits. No bowel dilatation or evidence of obstruction. No free air or radiopaque calculi. The osseous structures and soft tissues appear unremarkable. IMPRESSION: Negative abdominal radiographs.  No acute cardiopulmonary disease. Electronically Signed   By: Anner Crete M.D.   On: 11/23/2017 21:45    EKG: Independently reviewed.  Sinus rhythm at 86 beats T wave inversions unchanged from previous tracing  Assessment/Plan GI bleed with acute blood loss anemia: Patient  presents with a hemoglobin of 5.6 with guaiac positive stools.  He admits to continued alcohol use and intermittent use of NSAIDs. - Admit to a stepdown bed due to complaints of continued chest pain - Continue with transfusion of 2 units of packed red blood cells - Continue Protonix drip - Serial monitoring of hemoglobin and transfuse blood products as needed - Will need to consult GI in a.m.  Leukocytosis: WBC elevated at 13.1 on repeat CBC.  Acute abdominal series did not show any acute abnormalities. - Follow-up repeat CBC in  Chronic pancreatitis: Patient reports left-sided abdominal pain related to pancreatitis despite lipase level.  Lipase 46 on admission. - Continue IV fluids at 125 mL/h - Morphine IV  Atypical chest pain: Patient complains of right-sided chest pain suspect likely not cardiac in nature.  Initial troponins negative x2. - Follow-up telemetry overnight  Alcohol abuse: Patient admits to continued alcohol use, but reports last drink 2 days ago.  Alcohol level on admission was noted to be undetectable. - CWIA protocols initiated with scheduled Ativan  History of Wolff-Parkinson-White   History of polysubstance abuse: patient with known history of tobacco, cocaine, marijuana, and benzodiazepine abuse. - Check UDS    DVT prophylaxis: SCDs Code Status: Full Family Communication: No family present at bedside Disposition Plan: TBD Consults called: none  Admission status: Inpatient  Norval Morton MD [facility] Pager [phone]   If 7PM-7AM, please contact night-coverage [email or web address] Password [email or web address]  11/24/2017, 12:40 AM

## 2017-11-24 NOTE — Progress Notes (Signed)
[name] [id number] Admitted to 8I50: 11/24/2017 4:46 AM Attending Provider: Norval Morton, MD    [name] is a 47 y.o. male patient admitted from ED awake, alert  & orientated  X 3,  Full Code, VSS - Blood pressure 121/71, pulse (!) 103, temperature 97.8 F (36.6 C), temperature source Axillary, resp. rate 18, height 5\' 9"  (1.753 m), SpO2 100 %. Tele # [id number] placed and pt is currently running:SR   IV site WDL:  with a transparent dsg that's clean dry and intact.  Allergies:   Allergies  Allergen Reactions  . Robaxin [Methocarbamol] Other (See Comments)    "jumpy limbs"  . Shellfish-Derived Products Nausea And Vomiting  . Trazodone Other (See Comments)    Muscle spasms  . Trazodone And Nefazodone Other (See Comments)    Muscle spasms  . Contrast Media [Iodinated Diagnostic Agents] Hives  . Reglan [Metoclopramide] Other (See Comments)    Muscle spasms     Past Medical History:  Diagnosis Date  . Alcoholism /alcohol abuse ([place])   . Anemia   . Anxiety   . Arthritis    "knees; arms; elbows" (03/26/2015)  . Asthma   . Bipolar disorder ([place])   . Chronic bronchitis ([place])   . Chronic lower back pain   . Chronic pancreatitis ([place])   . Cocaine abuse ([place])   . Depression   . Family history of adverse reaction to anesthesia    "grandmother gets confused"  . Femoral condyle fracture ([place]) 03/08/2014   left medial/notes 03/09/2014  . GERD (gastroesophageal reflux disease)   . H/O hiatal hernia   . H/O suicide attempt 10/2012  . Heart murmur    "when he was little" (03/06/2013)  . High cholesterol   . History of blood transfusion 10/2012   "when I tried to commit suicide"  . History of stomach ulcers   . Hypertension   . Marijuana abuse, continuous   . Migraine    "a few times/year" (03/26/2015)  . Pneumonia 1990's X 3  . PTSD (post-traumatic stress disorder)   . Shortness of breath    "can happen at anytime" (03/06/2013)  . Sickle cell trait  ([place])   . WPW (Wolff-Parkinson-White syndrome)    [name] 03/06/2013    History:  obtained from patient  Pt orientation to unit, room and routine. Information packet given to patient.  Admission INP armband ID verified with patient, and in place. SR up x 2, fall risk assessment complete with Patient verbalizing understanding of risks associated with falls. Pt verbalizes an understanding of how to use the call bell and to call for help before getting out of bed.  Skin, clean-dry- intact without evidence of bruising, or skin tears.   No evidence of skin break down noted on exam.  Will cont to monitor and assist as needed.  Parthenia Ames, RN 11/24/2017 4:46 AM

## 2017-11-24 NOTE — Progress Notes (Signed)
@IPLOG @        PROGRESS NOTE                                                                                                                                                                                                             Patient Demographics:    [name], is a 47 y.o. male, DOB - [birth year], WUJ:[id number]  Admit date - 11/23/2017   Admitting Physician Norval Morton, MD  Outpatient Primary MD for the patient is [name], [name], NP  LOS - 0  Chief Complaint  Patient presents with  . Abdominal Pain       Brief Narrative  [name] is a 47 y.o. male with medical history significant of HTN, alcohol abuse, chronic pancreatitis, WPW, anxiety; who presents with complaints of left sided abdominal pain.  He notes similar symptoms previously in the past related to history of chronic pancreatitis despite lipase levels.  He had recently ran out of oxycodone 10 mg pills to take for symptoms and been using Tylenol and ibuprofen to treat his symptoms at home.  Yesterday, he began having left abdominal pain with multiple episodes of vomiting.  Some of the emesis was noted to be red-colored, but for some reason patient did not believe that it was blood.  Thereafter, patient reports having up to 7 bowel movements before taking Imodium with improvement of symptoms.  Stools were noted to be coffee-ground in color and when he wiped noted a maroon color that concern to him for blood.  In the ER found to have a hemoglobin of 5.6, normal lipase and INR, heme positive stool.   Subjective:    [name] today has, No headache, No chest pain, No abdominal pain - No Nausea, No new weakness tingling or numbness, No Cough - SOB.    Assessment  & Plan :    1.  Acute on chronic GI bleed related anemia.  Unclear whether upper or lower.  Severely iron deficient, received 2 units of packed RBC, on IV PPI, will give IV iron as well, monitor H&H, GI on board EGD due on 11/25/2017.  2.   History of alcohol, smoking, cocaine abuse and other substance abuse.  Counseled to quit all.  Last alcoholic drink 9/56/2130, on CIWA protocol will monitor.  3.  History of chronic pancreatitis.  No acute issues supportive care.  4.  WPW history.  Stable monitor.  Continue beta-blocker.  5.  Severe iron deficiency.  Replace.    Diet :  Diet Order  Diet NPO time specified  Diet effective midnight        Diet clear liquid Room service appropriate? Yes; Fluid consistency: Thin; Fluid restriction: Other (see comments)  Diet effective now           Family Communication  :  None  Code Status : Full  Disposition Plan  :  TBD  Consults  :  GI  Procedures  :    EGD -   DVT Prophylaxis  :    SCDs    Lab Results  Component Value Date   PLT 249 11/24/2017    Inpatient Medications  Scheduled Meds: . folic acid  1 mg Oral Daily  . LORazepam  0-4 mg Intravenous Q6H   Followed by  . [START ON 11/26/2017] LORazepam  0-4 mg Intravenous Q12H  . metoprolol tartrate  25 mg Oral BID  . multivitamin with minerals  1 tablet Oral Daily  . nicotine  21 mg Transdermal Daily  . [START ON 11/27/2017] pantoprazole  40 mg Intravenous Q12H  . sodium chloride flush  3 mL Intravenous Q12H  . thiamine  100 mg Oral Daily   Or  . thiamine  100 mg Intravenous Daily   Continuous Infusions: . sodium chloride 1,000 mL (11/24/17 0717)  . pantoprozole (PROTONIX) infusion 8 mg/hr (11/24/17 0137)   PRN Meds:.acetaminophen **OR** acetaminophen, albuterol, gi cocktail, LORazepam **OR** LORazepam, morphine injection, ondansetron **OR** ondansetron (ZOFRAN) IV  Antibiotics  :    Anti-infectives (From admission, onward)   None         Objective:   Vitals:   11/24/17 0333 11/24/17 0355 11/24/17 0625 11/24/17 0626  BP: 114/69 109/69  111/82  Pulse: 93 89    Resp: 20 18  19   Temp: 98.6 F (37 C) 97.8 F (36.6 C) 97.8 F (36.6 C) 97.8 F (36.6 C)  TempSrc: Oral Axillary Oral Oral   SpO2: 100% 100% 100% 100%  Height:        Wt Readings from Last 3 Encounters:  11/17/17 65.8 kg (145 lb)  10/23/17 65.8 kg (145 lb)  09/29/17 63.6 kg (140 lb 3.4 oz)     Intake/Output Summary (Last 24 hours) at 11/24/2017 1309 Last data filed at 11/24/2017 0622 Gross per 24 hour  Intake 811.33 ml  Output -  Net 811.33 ml     Physical Exam  Awake,  No new F.N deficits, Normal affect [place].AT,PERRAL Supple Neck,No JVD, No cervical lymphadenopathy appriciated.  Symmetrical Chest wall movement, Good air movement bilaterally, CTAB RRR,No Gallops,Rubs or new Murmurs, No Parasternal Heave +ve B.Sounds, Abd Soft, No tenderness, No organomegaly appriciated, No rebound - guarding or rigidity. No Cyanosis, Clubbing or edema, No new Rash or bruise       Data Review:    CBC Recent Labs  Lab 11/17/17 1810 11/18/17 1355 11/23/17 1935 11/23/17 2330 11/24/17 1000  WBC 9.6 11.0* 10.3 13.1* 10.4  HGB 9.8* 9.5* 5.6* 5.9* 8.8*  HCT 31.9* 31.6* 18.4* 19.6* 27.9*  PLT 236 228 276 286 249  MCV 76.9* 79.6 78.6 80.0 78.8  MCH 23.6* 23.9* 23.9* 24.1* 24.9*  MCHC 30.7 30.1 30.4 30.1 31.5  RDW 22.1* 22.7* 22.9* 23.1* 21.2*  LYMPHSABS  --  2.1  --  5.2*  --   MONOABS  --  0.8  --  1.0  --   EOSABS  --  0.2  --  0.5  --   BASOSABS  --  0.0  --  0.0  --  Chemistries  Recent Labs  Lab 11/17/17 1810 11/18/17 1355 11/23/17 1935  NA 136 136 139  K 3.7 3.8 4.0  CL 102 105 109  CO2 24 19* 21*  GLUCOSE 121* 92 123*  BUN 9 8 <5*  CREATININE 0.86 0.58* 0.72  CALCIUM 11.0* 10.0 8.8*  AST 30 25 40  ALT 24 18 20   ALKPHOS 77 71 57  BILITOT 1.0 0.7 0.3   ------------------------------------------------------------------------------------------------------------------ No results for input(s): CHOL, HDL, LDLCALC, TRIG, CHOLHDL, LDLDIRECT in the last 72 hours.  ----------------------------------------------------------------------------------------------------------------- No results  for input(s): TSH, T4TOTAL, T3FREE, THYROIDAB in the last 72 hours.  Invalid input(s): FREET3 ------------------------------------------------------------------------------------------------------------------ Recent Labs    11/23/17 2330  VITAMINB12 730  FOLATE 10.9  FERRITIN 8*  TIBC 505*  IRON 8*  RETICCTPCT 3.2*    Coagulation profile Recent Labs  Lab 11/23/17 2209  INR 0.96    No results for input(s): DDIMER in the last 72 hours.  Cardiac Enzymes Recent Labs  Lab 11/23/17 2330  TROPONINI <0.03   ------------------------------------------------------------------------------------------------------------------    Component Value Date/Time   BNP 52.9 07/03/2016 1314    Micro Results Recent Results (from the past 240 hour(s))  MRSA PCR Screening     Status: None   Collection Time: 11/24/17  3:07 AM  Result Value Ref Range Status   MRSA by PCR NEGATIVE NEGATIVE Final    Comment:        The GeneXpert MRSA Assay (FDA approved for NASAL specimens only), is one component of a comprehensive MRSA colonization surveillance program. It is not intended to diagnose MRSA infection nor to guide or monitor treatment for MRSA infections. Performed at [facility], [place] [address], [place], [place] [address]     Radiology Reports Dg Abdomen Acute W/chest  Result Date: 11/23/2017 CLINICAL DATA:  47 year old male with abdominal pain. EXAM: DG ABDOMEN ACUTE W/ 1V CHEST COMPARISON:  Chest radiograph dated 10/19/2017 FINDINGS: The lungs are clear. There is no pleural effusion or pneumothorax. Cardiac silhouette is within normal limits. No bowel dilatation or evidence of obstruction. No free air or radiopaque calculi. The osseous structures and soft tissues appear unremarkable. IMPRESSION: Negative abdominal radiographs.  No acute cardiopulmonary disease. Electronically Signed   By: Anner Crete M.D.   On: 11/23/2017 21:45    Time Spent in minutes  30   Lala Lund M.D on 11/24/2017 at 1:09 PM  Between 7am to 7pm - Pager - [phone] ( page via [email or web address], text pages only, please mention full 10 digit call back number). After 7pm go to [email or web address] - password [facility]

## 2017-11-25 ENCOUNTER — Encounter (HOSPITAL_COMMUNITY)
Admission: EM | Disposition: A | Discharge status: Home / Self Care | Payer: Self-pay | Source: Home / Self Care | Attending: Internal Medicine

## 2017-11-25 ENCOUNTER — Inpatient Hospital Stay (HOSPITAL_COMMUNITY): Payer: Self-pay | Admitting: Anesthesiology

## 2017-11-25 DIAGNOSIS — K254 Chronic or unspecified gastric ulcer with hemorrhage: Secondary | ICD-10-CM

## 2017-11-25 HISTORY — PX: BIOPSY: SHX5522

## 2017-11-25 HISTORY — PX: ESOPHAGOGASTRODUODENOSCOPY (EGD) WITH PROPOFOL: SHX5813

## 2017-11-25 LAB — COMPREHENSIVE METABOLIC PANEL
ALT: 17 U/L (ref 0–44)
AST: 31 U/L (ref 15–41)
Albumin: 2.9 g/dL — ABNORMAL LOW (ref 3.5–5.0)
Alkaline Phosphatase: 51 U/L (ref 38–126)
Anion gap: 9 (ref 5–15)
CHLORIDE: 112 mmol/L — AB (ref 98–111)
CO2: 21 mmol/L — AB (ref 22–32)
CREATININE: 0.67 mg/dL (ref 0.61–1.24)
Calcium: 8.1 mg/dL — ABNORMAL LOW (ref 8.9–10.3)
Glucose, Bld: 79 mg/dL (ref 70–99)
Potassium: 4.3 mmol/L (ref 3.5–5.1)
Sodium: 142 mmol/L (ref 135–145)
Total Bilirubin: 0.8 mg/dL (ref 0.3–1.2)
Total Protein: 5.5 g/dL — ABNORMAL LOW (ref 6.5–8.1)

## 2017-11-25 LAB — CBC
HCT: 25.5 % — ABNORMAL LOW (ref 39.0–52.0)
Hemoglobin: 8.2 g/dL — ABNORMAL LOW (ref 13.0–17.0)
MCH: 25.4 pg — AB (ref 26.0–34.0)
MCHC: 32.2 g/dL (ref 30.0–36.0)
MCV: 78.9 fL (ref 78.0–100.0)
PLATELETS: 268 10*3/uL (ref 150–400)
RBC: 3.23 MIL/uL — AB (ref 4.22–5.81)
RDW: 21.2 % — AB (ref 11.5–15.5)
WBC: 11.3 10*3/uL — AB (ref 4.0–10.5)

## 2017-11-25 LAB — MAGNESIUM: MAGNESIUM: 1.6 mg/dL — AB (ref 1.7–2.4)

## 2017-11-25 SURGERY — ESOPHAGOGASTRODUODENOSCOPY (EGD) WITH PROPOFOL
Anesthesia: Monitor Anesthesia Care

## 2017-11-25 MED ORDER — PANTOPRAZOLE SODIUM 40 MG PO TBEC: 40.0000 mg | DELAYED_RELEASE_TABLET | Freq: Two times a day (BID) | ORAL | Status: DC

## 2017-11-25 MED ORDER — PROPOFOL 500 MG/50ML IV EMUL
INTRAVENOUS | Status: DC | PRN
  Administered 2017-11-25: 100 ug/kg/min via INTRAVENOUS

## 2017-11-25 MED ORDER — TRAMADOL HCL 50 MG PO TABS
50.0000 mg | ORAL_TABLET | Freq: Four times a day (QID) | ORAL | Status: DC | PRN
  Administered 2017-11-25: 50 mg via ORAL
  Filled 2017-11-25: qty 1

## 2017-11-25 MED ORDER — PROPOFOL 10 MG/ML IV BOLUS
INTRAVENOUS | Status: DC | PRN
  Administered 2017-11-25: 20 mg via INTRAVENOUS

## 2017-11-25 MED ORDER — MORPHINE SULFATE (PF) 2 MG/ML IV SOLN: 2.0000 mg | INTRAVENOUS | Status: DC | PRN

## 2017-11-25 MED ORDER — THIAMINE HCL 100 MG PO TABS: 100.0000 mg | ORAL_TABLET | Freq: Every day | ORAL | 0 refills | Status: DC

## 2017-11-25 MED ORDER — SUCRALFATE 1 G PO TABS: 1.0000 g | ORAL_TABLET | Freq: Four times a day (QID) | ORAL | 0 refills | Status: DC

## 2017-11-25 MED ORDER — PANTOPRAZOLE SODIUM 40 MG PO TBEC: 40.0000 mg | DELAYED_RELEASE_TABLET | Freq: Two times a day (BID) | ORAL | 2 refills | Status: DC

## 2017-11-25 MED ORDER — LACTATED RINGERS IV SOLN
INTRAVENOUS | Status: DC | PRN
  Administered 2017-11-25: 11:00:00 via INTRAVENOUS

## 2017-11-25 MED ORDER — MAGNESIUM SULFATE IN D5W 1-5 GM/100ML-% IV SOLN
1.0000 g | Freq: Once | INTRAVENOUS | Status: AC
Start: 2017-11-25 — End: 2017-11-25
  Administered 2017-11-25: 1 g via INTRAVENOUS
  Filled 2017-11-25: qty 100

## 2017-11-25 MED ORDER — FOLIC ACID 1 MG PO TABS: 1.0000 mg | ORAL_TABLET | Freq: Every day | ORAL | 0 refills | Status: DC

## 2017-11-25 SURGICAL SUPPLY — 15 items

## 2017-11-25 NOTE — Interval H&P Note (Signed)
History and Physical Interval Note:  11/25/2017 11:05 AM  [name]  has presented today for surgery, with the diagnosis of GI bleed  The various methods of treatment have been discussed with the patient and family. After consideration of risks, benefits and other options for treatment, the patient has consented to  Procedure(s): ESOPHAGOGASTRODUODENOSCOPY (EGD) WITH PROPOFOL (N/A) as a surgical intervention .  The patient's history has been reviewed, patient examined, no change in status, stable for surgery.  I have reviewed the patient's chart and labs.  Questions were answered to the patient's satisfaction.     [name]

## 2017-11-25 NOTE — Progress Notes (Signed)
@IPLOG @        PROGRESS NOTE                                                                                                                                                                                                             Patient Demographics:    [name], is a 47 y.o. male, DOB - 09/12/1970, GQQ:[id number]  Admit date - 11/23/2017   Admitting Physician Norval Morton, MD  Outpatient Primary MD for the patient is [name], [name], NP  LOS - 1  Chief Complaint  Patient presents with  . Abdominal Pain       Brief Narrative  [name] is a 47 y.o. male with medical history significant of HTN, alcohol abuse, chronic pancreatitis, WPW, anxiety; who presents with complaints of left sided abdominal pain.  He notes similar symptoms previously in the past related to history of chronic pancreatitis despite lipase levels.  He had recently ran out of oxycodone 10 mg pills to take for symptoms and been using Tylenol and ibuprofen to treat his symptoms at home.  Yesterday, he began having left abdominal pain with multiple episodes of vomiting.  Some of the emesis was noted to be red-colored, but for some reason patient did not believe that it was blood.  Thereafter, patient reports having up to 7 bowel movements before taking Imodium with improvement of symptoms.  Stools were noted to be coffee-ground in color and when he wiped noted a maroon color that concern to him for blood.  In the ER found to have a hemoglobin of 5.6, normal lipase and INR, heme positive stool.   Subjective:   Patient in bed, appears comfortable, denies any headache, no fever, no chest pain or pressure, no shortness of breath , is he has chronic epigastric abdominal pain however seems to be in no distress whatsoever. No focal weakness.   Assessment  & Plan :    1.  Acute on chronic GI bleed related anemia.  Unclear whether upper or lower.  Since BUN was stable and he had no nausea vomiting or  heartburn likely lower GI bleed, stable H&H after 2 units of packed RBC transfusion on the day of admission, he also received IV iron on the same day, currently on IV PPI no signs of further bleeding, GI on board going for EGD on 11/25/2017.  If stable discharge tomorrow.  2.  History of alcohol, smoking, cocaine abuse and other substance abuse.  Counseled to quit all.  Last alcoholic drink 6/71/2458,  on CIWA protocol will monitor.  3.  History of chronic pancreatitis.  No acute issues supportive care.  But narcotic seeking behavior, appears to be in no distress but wants IV morphine or he says he prefers IV Dilaudid.  Stop IV narcotics place him on oral tramadol.  He is in no distress in my evaluation.  4.  WPW history.  Stable monitor.  Continue beta-blocker.  5.  Severe iron deficiency.  Replaced IV.    Diet :  Diet Order           Diet NPO time specified  Diet effective midnight           Family Communication  :  None  Code Status : Full  Disposition Plan  :  TBD  Consults  :  GI  Procedures  :    EGD -   DVT Prophylaxis  :    SCDs    Lab Results  Component Value Date   PLT 268 11/25/2017    Inpatient Medications  Scheduled Meds: . chlorhexidine  15 mL Mouth Rinse BID  . folic acid  1 mg Oral Daily  . LORazepam  0-4 mg Intravenous Q6H   Followed by  . [START ON 11/26/2017] LORazepam  0-4 mg Intravenous Q12H  . mouth rinse  15 mL Mouth Rinse q12n4p  . metoprolol tartrate  25 mg Oral BID  . multivitamin with minerals  1 tablet Oral Daily  . nicotine  21 mg Transdermal Daily  . [START ON 11/27/2017] pantoprazole  40 mg Intravenous Q12H  . sodium chloride flush  3 mL Intravenous Q12H  . thiamine  100 mg Oral Daily   Or  . thiamine  100 mg Intravenous Daily   Continuous Infusions: . sodium chloride 1,000 mL (11/25/17 0337)  . pantoprozole (PROTONIX) infusion Stopped (11/25/17 0635)   PRN Meds:.acetaminophen **OR** acetaminophen, albuterol, gi cocktail,  LORazepam **OR** LORazepam, ondansetron **OR** ondansetron (ZOFRAN) IV, traMADol  Antibiotics  :    Anti-infectives (From admission, onward)   None         Objective:   Vitals:   11/24/17 1851 11/24/17 1854 11/24/17 1900 11/24/17 2355  BP:  (!) 135/100  (!) 128/97  Pulse:      Resp:  16  12  Temp: (!) 97.2 F (36.2 C)  97.7 F (36.5 C)   TempSrc: Axillary     SpO2:      Height:        Wt Readings from Last 3 Encounters:  11/17/17 65.8 kg (145 lb)  10/23/17 65.8 kg (145 lb)  09/29/17 63.6 kg (140 lb 3.4 oz)     Intake/Output Summary (Last 24 hours) at 11/25/2017 0918 Last data filed at 11/24/2017 2300 Gross per 24 hour  Intake 1078.98 ml  Output -  Net 1078.98 ml     Physical Exam  Awake Alert, Oriented X 3, No new F.N deficits, Normal affect [place].AT,PERRAL Supple Neck,No JVD, No cervical lymphadenopathy appriciated.  Symmetrical Chest wall movement, Good air movement bilaterally, CTAB RRR,No Gallops, Rubs or new Murmurs, No Parasternal Heave +ve B.Sounds, Abd Soft, No tenderness, No organomegaly appriciated, No rebound - guarding or rigidity. No Cyanosis, Clubbing or edema, No new Rash or bruise    Data Review:    CBC Recent Labs  Lab 11/18/17 1355  11/23/17 2330 11/24/17 1000 11/24/17 1544 11/24/17 2256 11/25/17 0636  WBC 11.0*   < > 13.1* 10.4 12.8* 11.8* 11.3*  HGB 9.5*   < > 5.9* 8.8*  8.8* 8.4* 8.2*  HCT 31.6*   < > 19.6* 27.9* 27.2* 26.5* 25.5*  PLT 228   < > 286 249 257 208 268  MCV 79.6   < > 80.0 78.8 77.7* 79.6 78.9  MCH 23.9*   < > 24.1* 24.9* 25.1* 25.2* 25.4*  MCHC 30.1   < > 30.1 31.5 32.4 31.7 32.2  RDW 22.7*   < > 23.1* 21.2* 21.2* 21.3* 21.2*  LYMPHSABS 2.1  --  5.2*  --   --   --   --   MONOABS 0.8  --  1.0  --   --   --   --   EOSABS 0.2  --  0.5  --   --   --   --   BASOSABS 0.0  --  0.0  --   --   --   --    < > = values in this interval not displayed.    Chemistries  Recent Labs  Lab 11/18/17 1355 11/23/17 1935  11/25/17 0636  NA 136 139 142  K 3.8 4.0 4.3  CL 105 109 112*  CO2 19* 21* 21*  GLUCOSE 92 123* 79  BUN 8 <5* <5*  CREATININE 0.58* 0.72 0.67  CALCIUM 10.0 8.8* 8.1*  AST 25 40 31  ALT 18 20 17   ALKPHOS 71 57 51  BILITOT 0.7 0.3 0.8   ------------------------------------------------------------------------------------------------------------------ No results for input(s): CHOL, HDL, LDLCALC, TRIG, CHOLHDL, LDLDIRECT in the last 72 hours.  ----------------------------------------------------------------------------------------------------------------- No results for input(s): TSH, T4TOTAL, T3FREE, THYROIDAB in the last 72 hours.  Invalid input(s): FREET3 ------------------------------------------------------------------------------------------------------------------ Recent Labs    11/23/17 2330  VITAMINB12 730  FOLATE 10.9  FERRITIN 8*  TIBC 505*  IRON 8*  RETICCTPCT 3.2*    Coagulation profile Recent Labs  Lab 11/23/17 2209  INR 0.96    No results for input(s): DDIMER in the last 72 hours.  Cardiac Enzymes Recent Labs  Lab 11/23/17 2330  TROPONINI <0.03   ------------------------------------------------------------------------------------------------------------------    Component Value Date/Time   BNP 52.9 07/03/2016 1314    Micro Results Recent Results (from the past 240 hour(s))  MRSA PCR Screening     Status: None   Collection Time: 11/24/17  3:07 AM  Result Value Ref Range Status   MRSA by PCR NEGATIVE NEGATIVE Final    Comment:        The GeneXpert MRSA Assay (FDA approved for NASAL specimens only), is one component of a comprehensive MRSA colonization surveillance program. It is not intended to diagnose MRSA infection nor to guide or monitor treatment for MRSA infections. Performed at [facility], [place] [address], [place], [place] [address]     Radiology Reports Dg Abdomen Acute W/chest  Result Date: 11/23/2017 CLINICAL DATA:   47 year old male with abdominal pain. EXAM: DG ABDOMEN ACUTE W/ 1V CHEST COMPARISON:  Chest radiograph dated 10/19/2017 FINDINGS: The lungs are clear. There is no pleural effusion or pneumothorax. Cardiac silhouette is within normal limits. No bowel dilatation or evidence of obstruction. No free air or radiopaque calculi. The osseous structures and soft tissues appear unremarkable. IMPRESSION: Negative abdominal radiographs.  No acute cardiopulmonary disease. Electronically Signed   By: Anner Crete M.D.   On: 11/23/2017 21:45    Time Spent in minutes  30   Lala Lund M.D on 11/25/2017 at 9:18 AM  Between 7am to 7pm - Pager - [phone] ( page via [email or web address], text pages only, please mention full 10 digit call back  number). After 7pm go to [email or web address] - password [email or web address]

## 2017-11-25 NOTE — Discharge Summary (Signed)
[name] NIO:[id number] DOB: 1970-12-23 DOA: 11/23/2017  PCP: Jason Coots, NP  Admit date: 11/23/2017  Discharge date: 11/25/2017  Admitted From: Home   Disposition:  Home   Recommendations for Outpatient Follow-up:   Follow up with PCP in 1-2 weeks  PCP Please obtain BMP/CBC, 2 view CXR in 1week,  (see Discharge instructions)   PCP Please follow up on the following pending results:    Home Health: None   Equipment/Devices: None  Consultations: GI Discharge Condition: Fair   CODE STATUS: Full   Diet Recommendation: Soft diet for another 24 hours then advance to regular consistency as tolerated   Chief Complaint  Patient presents with  . Abdominal Pain     Brief history of present illness from the day of admission and additional interim summary    Jason Raphael Wrightis a 47 y.o.malewith medical history significant of HTN, alcohol abuse, chronic pancreatitis, ? H/O WPW, anxiety; who presents with complaints of left sided abdominal pain. He notes similar symptoms previously in the past related to history of chronic pancreatitis despite lipase levels. He had recently ran out of oxycodone 10 mg pills to take for symptoms and been using Tylenol and ibuprofen to treat his symptoms at home. Yesterday, he began having left abdominal pain with multiple episodes of vomiting. Some of the emesis was noted to be red-colored, but for some reason patient did not believe that it was blood. Thereafter, patient reports having up to 7 bowel movements before taking Imodium with improvement of symptoms. Stools were noted to be coffee-ground in color and when he wiped noted a maroon color that concern to him for blood.  In the ER found to have a hemoglobin of 5.6, normal lipase and INR, heme positive  stool.                                                                   Hospital Course    1.  Acute on chronic UGI bleed related anemia.    Likely subacute to chronic, stable H&H after 2 units of packed RBC transfusion on the day of admission, he also received IV iron on the same day, he was placed on IV PPI no signs of further bleeding, GI was on board and he underwent EGD on 11/25/2017 showing some signs of gastritis with nonbleeding duodenal ulcers, patient was admitted on being discharged Case discussed with Dr. Paulita Moran who cleared him for discharge in the evening on twice daily oral PPI, requested patient to quit alcohol smoking and avoid NSAID use, follow with PCP and GI within 7 to 10 days of discharge.  2.  History of alcohol, smoking, cocaine abuse and other substance abuse.  Counseled to quit all.  Last alcoholic drink 12/25/2991, was on on CIWA protocol no signs  of DTs.  3.  History of chronic pancreatitis.  No acute issues supportive care.  But narcotic seeking behavior, appears to be in no distress but wants IV morphine or he says he prefers IV Dilaudid.  Upon discharge he was requesting that he be given some pills of narcotics and he told me" do not be afraid of giving me narcotics [name] to die of narcotic overdose this is a white man's disease" he was declined for any further narcotics prescription, charge nurse was witnessed to this conversation.  4.  ?? WPW history.  Stable monitor.  Continue beta-blocker.  5.  Severe iron deficiency.  Replaced IV, PCP to monitor if needed placed on oral iron supplementation.  6.  Hypomagnesemia.  Replaced.    Discharge diagnosis     Principal Problem:   GI bleed Active Problems:   Polysubstance abuse (tobacco, cocaine, THC, and ETOH)   Chronic pancreatitis (HCC)   Leukocytosis   Acute blood loss anemia   Atypical chest pain    Discharge instructions    Discharge Instructions    Discharge instructions   Complete by:  As  directed    Follow with Primary MD [name], [name], NP in 7 days   Get CBC, CMP, 2 view Chest X ray checked  by Primary MD or SNF MD in 5-7 days ( we routinely change or add medications that can affect your baseline labs and fluid status, therefore we recommend that you get the mentioned basic workup next visit with your PCP, your PCP may decide not to get them or add new tests based on their clinical decision)  Activity: As tolerated with Full fall precautions use walker/cane & assistance as needed  Disposition Home   Diet:  Soft diet for the next 24 hrs then heart healthy as tolerated  For Heart failure patients - Check your Weight same time everyday, if you gain over 2 pounds, or you develop in leg swelling, experience more shortness of breath or chest pain, call your Primary MD immediately. Follow Cardiac Low Salt Diet and 1.5 lit/day fluid restriction.  Special Instructions: If you have smoked or chewed Tobacco  in the last 2 yrs please stop smoking, stop any regular Alcohol  and or any Recreational drug use.  On your next visit with your primary care physician please Get Medicines reviewed and adjusted.  Please request your Prim.MD to go over all Hospital Tests and Procedure/Radiological results at the follow up, please get all Hospital records sent to your Prim MD by signing hospital release before you go home.  If you experience worsening of your admission symptoms, develop shortness of breath, life threatening emergency, suicidal or homicidal thoughts you must seek medical attention immediately by calling 911 or calling your MD immediately  if symptoms less severe.  You Must read complete instructions/literature along with all the possible adverse reactions/side effects for all the Medicines you take and that have been prescribed to you. Take any new Medicines after you have completely understood and accpet all the possible adverse reactions/side effects.   Do not drive, operate  heavy machinery, perform activities at heights, swimming or participation in water activities or provide baby sitting services if your were admitted for syncope or siezures until you have seen by Primary MD or a Neurologist and advised to do so again.  Do not drive when taking Pain medications.    Do not take more than prescribed Pain, Sleep and Anxiety Medications  Wear Seat belts while driving.  Please note  You were cared for by a hospitalist during your hospital stay. If you have any questions about your discharge medications or the care you received while you were in the hospital after you are discharged, you can call the unit and asked to speak with the hospitalist on call if the hospitalist that took care of you is not available. Once you are discharged, your primary care physician will handle any further medical issues. Please note that NO REFILLS for any discharge medications will be authorized once you are discharged, as it is imperative that you return to your primary care physician (or establish a relationship with a primary care physician if you do not have one) for your aftercare needs so that they can reassess your need for medications and monitor your lab values.   Increase activity slowly   Complete by:  As directed       Discharge Medications   Allergies as of 11/25/2017      Reactions   Robaxin [methocarbamol] Other (See Comments)   "jumpy limbs"   Shellfish-derived Products Nausea And Vomiting   Trazodone Other (See Comments)   Muscle spasms   Trazodone And Nefazodone Other (See Comments)   Muscle spasms   Contrast Media [iodinated Diagnostic Agents] Hives   Reglan [metoclopramide] Other (See Comments)   Muscle spasms      Medication List    STOP taking these medications   famotidine 20 MG tablet Commonly known as:  PEPCID     TAKE these medications   acetaminophen 500 MG tablet Commonly known as:  TYLENOL Take 1,000 mg by mouth every 4 (four) hours as  needed for moderate pain.   albuterol 108 (90 Base) MCG/ACT inhaler Commonly known as:  PROVENTIL HFA;VENTOLIN HFA Inhale 2 puffs into the lungs every 6 (six) hours as needed for wheezing or shortness of breath.   folic acid 1 MG tablet Commonly known as:  FOLVITE Take 1 tablet (1 mg total) by mouth daily. Start taking on:  11/26/2017   hydrALAZINE 25 MG tablet Commonly known as:  APRESOLINE Take 1 tablet (25 mg total) by mouth every 8 (eight) hours. What changed:  when to take this   hydrOXYzine 50 MG capsule Commonly known as:  VISTARIL Take 50 mg by mouth 3 (three) times daily as needed for anxiety.   lipase/protease/amylase 12000 units Cpep capsule Commonly known as:  CREON Take 1 capsule (12,000 Units total) by mouth 3 (three) times daily with meals. For pancreatitis   loratadine 10 MG tablet Commonly known as:  CLARITIN Take 1 tablet (10 mg total) by mouth daily as needed for allergies or rhinitis. (May purchase from over the counter): For allergies   metoprolol tartrate 25 MG tablet Commonly known as:  LOPRESSOR Take 1 tablet (25 mg total) by mouth 2 (two) times daily. For high blood pressure   multivitamin with minerals Tabs tablet Take 1 tablet by mouth daily.   ondansetron 4 MG tablet Commonly known as:  ZOFRAN Take 1 tablet (4 mg total) by mouth every 8 (eight) hours as needed for nausea or vomiting.   pantoprazole 40 MG tablet Commonly known as:  PROTONIX Take 1 tablet (40 mg total) by mouth 2 (two) times daily. What changed:  when to take this   sertraline 100 MG tablet Commonly known as:  ZOLOFT Take 1 tablet (100 mg total) by mouth daily.   sucralfate 1 g tablet Commonly known as:  CARAFATE Take 1 tablet (1 g total) by  mouth 4 (four) times daily.   thiamine 100 MG tablet Take 1 tablet (100 mg total) by mouth daily. Start taking on:  11/26/2017       Follow-up Information    [name], [name], NP. Schedule an appointment as soon as possible for a  visit in 1 week(s).   Contact information: [place] [address] [phone]        Arta Silence, MD. Schedule an appointment as soon as possible for a visit in 1 week(s).   Specialty:  Gastroenterology Contact information: [address] [place] [address] [phone]           Major procedures and Radiology Reports - PLEASE review detailed and final reports thoroughly  -     EGD  - Normal esophagus. - Gastritis. Biopsied. - Erosive gastropathy. - Small non-bleeding pyloric channel ulcers. - Multiple non-bleeding duodenal ulcers with pigmented material. - Congested duodenal mucosa. - Overall GI bleeding most likely from peptic ulcers; no high-risk stigmata seen; overall low risk for rebleeding.   Dg Abdomen Acute W/chest  Result Date: 11/23/2017 CLINICAL DATA:  47 year old male with abdominal pain. EXAM: DG ABDOMEN ACUTE W/ 1V CHEST COMPARISON:  Chest radiograph dated 10/19/2017 FINDINGS: The lungs are clear. There is no pleural effusion or pneumothorax. Cardiac silhouette is within normal limits. No bowel dilatation or evidence of obstruction. No free air or radiopaque calculi. The osseous structures and soft tissues appear unremarkable. IMPRESSION: Negative abdominal radiographs.  No acute cardiopulmonary disease. Electronically Signed   By: Anner Crete M.D.   On: 11/23/2017 21:45    Micro Results     Recent Results (from the past 240 hour(s))  MRSA PCR Screening     Status: None   Collection Time: 11/24/17  3:07 AM  Result Value Ref Range Status   MRSA by PCR NEGATIVE NEGATIVE Final    Comment:        The GeneXpert MRSA Assay (FDA approved for NASAL specimens only), is one component of a comprehensive MRSA colonization surveillance program. It is not intended to diagnose MRSA infection nor to guide or monitor treatment for MRSA infections. Performed at [facility], [place] [address] [place], [place]  [address]     Today   Subjective    [name] today has no headache,no chest abdominal pain,no new weakness tingling or numbness, feels much better wants to go home today.    Objective   Blood pressure 138/90, pulse 83, temperature 98 F (36.7 C), temperature source Oral, resp. rate 12, height 5\' 9"  (1.753 m), weight 65.8 kg (145 lb), SpO2 94 %.   Intake/Output Summary (Last 24 hours) at 11/25/2017 1614 Last data filed at 11/25/2017 1124 Gross per 24 hour  Intake 300 ml  Output -  Net 300 ml    Exam Awake Alert, Oriented x 3, No new F.N deficits, Normal affect [place].AT,PERRAL Supple Neck,No JVD, No cervical lymphadenopathy appriciated.  Symmetrical Chest wall movement, Good air movement bilaterally, CTAB RRR,No Gallops,Rubs or new Murmurs, No Parasternal Heave +ve B.Sounds, Abd Soft, Non tender, No organomegaly appriciated, No rebound -guarding or rigidity. No Cyanosis, Clubbing or edema, No new Rash or bruise   Data Review   CBC w Diff:  Lab Results  Component Value Date   WBC 11.3 (H) 11/25/2017   HGB 8.2 (L) 11/25/2017   HCT 25.5 (L) 11/25/2017   PLT 268 11/25/2017   LYMPHOPCT 40 11/23/2017   MONOPCT 8 11/23/2017  EOSPCT 4 11/23/2017   BASOPCT 0 11/23/2017    CMP:  Lab Results  Component Value Date   NA 142 11/25/2017   K 4.3 11/25/2017   CL 112 (H) 11/25/2017   CO2 21 (L) 11/25/2017   BUN <5 (L) 11/25/2017   CREATININE 0.67 11/25/2017   CREATININE 0.65 11/08/2012   PROT 5.5 (L) 11/25/2017   ALBUMIN 2.9 (L) 11/25/2017   BILITOT 0.8 11/25/2017   ALKPHOS 51 11/25/2017   AST 31 11/25/2017   ALT 17 11/25/2017  .   Total Time in preparing paper work, data evaluation and todays exam - 47 minutes  Lala Lund M.D on 11/25/2017 at [facility]   Office  [phone]

## 2017-11-25 NOTE — Anesthesia Postprocedure Evaluation (Signed)
Anesthesia Post Note  Patient: [name]  Procedure(s) Performed: ESOPHAGOGASTRODUODENOSCOPY (EGD) WITH PROPOFOL (N/A ) BIOPSY     Patient location during evaluation: Endoscopy Anesthesia Type: MAC Level of consciousness: awake and alert Pain management: pain level controlled Vital Signs Assessment: post-procedure vital signs reviewed and stable Respiratory status: spontaneous breathing, nonlabored ventilation, respiratory function stable and patient connected to nasal cannula oxygen Cardiovascular status: stable and blood pressure returned to baseline Postop Assessment: no apparent nausea or vomiting Anesthetic complications: no    Last Vitals:  Vitals:   11/25/17 1140 11/25/17 1148  BP: 121/86 138/90  Pulse: 71 83  Resp: 14 12  Temp:    SpO2: 100% 94%    Last Pain:  Vitals:   11/25/17 1148  TempSrc:   PainSc: 0-No pain                 Alyanna Stoermer

## 2017-11-25 NOTE — H&P (View-Only) (Signed)
@IPLOG @        PROGRESS NOTE                                                                                                                                                                                                             Patient Demographics:    [name], is a 47 y.o. male, DOB - 27-Aug-1970, WNI:[id number]  Admit date - 11/23/2017   Admitting Physician Norval Morton, MD  Outpatient Primary MD for the patient is [name], [name], NP  LOS - 1  Chief Complaint  Patient presents with  . Abdominal Pain       Brief Narrative  [name] is a 47 y.o. male with medical history significant of HTN, alcohol abuse, chronic pancreatitis, WPW, anxiety; who presents with complaints of left sided abdominal pain.  He notes similar symptoms previously in the past related to history of chronic pancreatitis despite lipase levels.  He had recently ran out of oxycodone 10 mg pills to take for symptoms and been using Tylenol and ibuprofen to treat his symptoms at home.  Yesterday, he began having left abdominal pain with multiple episodes of vomiting.  Some of the emesis was noted to be red-colored, but for some reason patient did not believe that it was blood.  Thereafter, patient reports having up to 7 bowel movements before taking Imodium with improvement of symptoms.  Stools were noted to be coffee-ground in color and when he wiped noted a maroon color that concern to him for blood.  In the ER found to have a hemoglobin of 5.6, normal lipase and INR, heme positive stool.   Subjective:   Patient in bed, appears comfortable, denies any headache, no fever, no chest pain or pressure, no shortness of breath , is he has chronic epigastric abdominal pain however seems to be in no distress whatsoever. No focal weakness.   Assessment  & Plan :    1.  Acute on chronic GI bleed related anemia.  Unclear whether upper or lower.  Since BUN was stable and he had no nausea vomiting or  heartburn likely lower GI bleed, stable H&H after 2 units of packed RBC transfusion on the day of admission, he also received IV iron on the same day, currently on IV PPI no signs of further bleeding, GI on board going for EGD on 11/25/2017.  If stable discharge tomorrow.  2.  History of alcohol, smoking, cocaine abuse and other substance abuse.  Counseled to quit all.  Last alcoholic drink 3/81/8299,  on CIWA protocol will monitor.  3.  History of chronic pancreatitis.  No acute issues supportive care.  But narcotic seeking behavior, appears to be in no distress but wants IV morphine or he says he prefers IV Dilaudid.  Stop IV narcotics place him on oral tramadol.  He is in no distress in my evaluation.  4.  WPW history.  Stable monitor.  Continue beta-blocker.  5.  Severe iron deficiency.  Replaced IV.    Diet :  Diet Order           Diet NPO time specified  Diet effective midnight           Family Communication  :  None  Code Status : Full  Disposition Plan  :  TBD  Consults  :  GI  Procedures  :    EGD -   DVT Prophylaxis  :    SCDs    Lab Results  Component Value Date   PLT 268 11/25/2017    Inpatient Medications  Scheduled Meds: . chlorhexidine  15 mL Mouth Rinse BID  . folic acid  1 mg Oral Daily  . LORazepam  0-4 mg Intravenous Q6H   Followed by  . [START ON 11/26/2017] LORazepam  0-4 mg Intravenous Q12H  . mouth rinse  15 mL Mouth Rinse q12n4p  . metoprolol tartrate  25 mg Oral BID  . multivitamin with minerals  1 tablet Oral Daily  . nicotine  21 mg Transdermal Daily  . [START ON 11/27/2017] pantoprazole  40 mg Intravenous Q12H  . sodium chloride flush  3 mL Intravenous Q12H  . thiamine  100 mg Oral Daily   Or  . thiamine  100 mg Intravenous Daily   Continuous Infusions: . sodium chloride 1,000 mL (11/25/17 0337)  . pantoprozole (PROTONIX) infusion Stopped (11/25/17 0635)   PRN Meds:.acetaminophen **OR** acetaminophen, albuterol, gi cocktail,  LORazepam **OR** LORazepam, ondansetron **OR** ondansetron (ZOFRAN) IV, traMADol  Antibiotics  :    Anti-infectives (From admission, onward)   None         Objective:   Vitals:   11/24/17 1851 11/24/17 1854 11/24/17 1900 11/24/17 2355  BP:  (!) 135/100  (!) 128/97  Pulse:      Resp:  16  12  Temp: (!) 97.2 F (36.2 C)  97.7 F (36.5 C)   TempSrc: Axillary     SpO2:      Height:        Wt Readings from Last 3 Encounters:  11/17/17 65.8 kg (145 lb)  10/23/17 65.8 kg (145 lb)  09/29/17 63.6 kg (140 lb 3.4 oz)     Intake/Output Summary (Last 24 hours) at 11/25/2017 0918 Last data filed at 11/24/2017 2300 Gross per 24 hour  Intake 1078.98 ml  Output -  Net 1078.98 ml     Physical Exam  Awake Alert, Oriented X 3, No new F.N deficits, Normal affect [place].AT,PERRAL Supple Neck,No JVD, No cervical lymphadenopathy appriciated.  Symmetrical Chest wall movement, Good air movement bilaterally, CTAB RRR,No Gallops, Rubs or new Murmurs, No Parasternal Heave +ve B.Sounds, Abd Soft, No tenderness, No organomegaly appriciated, No rebound - guarding or rigidity. No Cyanosis, Clubbing or edema, No new Rash or bruise    Data Review:    CBC Recent Labs  Lab 11/18/17 1355  11/23/17 2330 11/24/17 1000 11/24/17 1544 11/24/17 2256 11/25/17 0636  WBC 11.0*   < > 13.1* 10.4 12.8* 11.8* 11.3*  HGB 9.5*   < > 5.9* 8.8*  8.8* 8.4* 8.2*  HCT 31.6*   < > 19.6* 27.9* 27.2* 26.5* 25.5*  PLT 228   < > 286 249 257 208 268  MCV 79.6   < > 80.0 78.8 77.7* 79.6 78.9  MCH 23.9*   < > 24.1* 24.9* 25.1* 25.2* 25.4*  MCHC 30.1   < > 30.1 31.5 32.4 31.7 32.2  RDW 22.7*   < > 23.1* 21.2* 21.2* 21.3* 21.2*  LYMPHSABS 2.1  --  5.2*  --   --   --   --   MONOABS 0.8  --  1.0  --   --   --   --   EOSABS 0.2  --  0.5  --   --   --   --   BASOSABS 0.0  --  0.0  --   --   --   --    < > = values in this interval not displayed.    Chemistries  Recent Labs  Lab 11/18/17 1355 11/23/17 1935  11/25/17 0636  NA 136 139 142  K 3.8 4.0 4.3  CL 105 109 112*  CO2 19* 21* 21*  GLUCOSE 92 123* 79  BUN 8 <5* <5*  CREATININE 0.58* 0.72 0.67  CALCIUM 10.0 8.8* 8.1*  AST 25 40 31  ALT 18 20 17   ALKPHOS 71 57 51  BILITOT 0.7 0.3 0.8   ------------------------------------------------------------------------------------------------------------------ No results for input(s): CHOL, HDL, LDLCALC, TRIG, CHOLHDL, LDLDIRECT in the last 72 hours.  ----------------------------------------------------------------------------------------------------------------- No results for input(s): TSH, T4TOTAL, T3FREE, THYROIDAB in the last 72 hours.  Invalid input(s): FREET3 ------------------------------------------------------------------------------------------------------------------ Recent Labs    11/23/17 2330  VITAMINB12 730  FOLATE 10.9  FERRITIN 8*  TIBC 505*  IRON 8*  RETICCTPCT 3.2*    Coagulation profile Recent Labs  Lab 11/23/17 2209  INR 0.96    No results for input(s): DDIMER in the last 72 hours.  Cardiac Enzymes Recent Labs  Lab 11/23/17 2330  TROPONINI <0.03   ------------------------------------------------------------------------------------------------------------------    Component Value Date/Time   BNP 52.9 07/03/2016 1314    Micro Results Recent Results (from the past 240 hour(s))  MRSA PCR Screening     Status: None   Collection Time: 11/24/17  3:07 AM  Result Value Ref Range Status   MRSA by PCR NEGATIVE NEGATIVE Final    Comment:        The GeneXpert MRSA Assay (FDA approved for NASAL specimens only), is one component of a comprehensive MRSA colonization surveillance program. It is not intended to diagnose MRSA infection nor to guide or monitor treatment for MRSA infections. Performed at [facility], [place] [address] [place], [place] [address]     Radiology Reports Dg Abdomen Acute W/chest  Result Date: 11/23/2017 CLINICAL DATA:   47 year old male with abdominal pain. EXAM: DG ABDOMEN ACUTE W/ 1V CHEST COMPARISON:  Chest radiograph dated 10/19/2017 FINDINGS: The lungs are clear. There is no pleural effusion or pneumothorax. Cardiac silhouette is within normal limits. No bowel dilatation or evidence of obstruction. No free air or radiopaque calculi. The osseous structures and soft tissues appear unremarkable. IMPRESSION: Negative abdominal radiographs.  No acute cardiopulmonary disease. Electronically Signed   By: Anner Crete M.D.   On: 11/23/2017 21:45    Time Spent in minutes  30   Lala Lund M.D on 11/25/2017 at 9:18 AM  Between 7am to 7pm - Pager - [phone] ( page via [email or web address], text pages only, please mention full 10 digit call back  number). After 7pm go to [email or web address] - password [email or web address]

## 2017-11-25 NOTE — Discharge Instructions (Signed)
Follow with Primary MD [name], [name], NP in 7 days   Get CBC, CMP, 2 view Chest X ray checked  by Primary MD or SNF MD in 5-7 days ( we routinely change or add medications that can affect your baseline labs and fluid status, therefore we recommend that you get the mentioned basic workup next visit with your PCP, your PCP may decide not to get them or add new tests based on their clinical decision)  Activity: As tolerated with Full fall precautions use walker/cane & assistance as needed  Disposition Home   Diet:  Soft diet for the next 24 hrs then heart healthy as tolerated  For Heart failure patients - Check your Weight same time everyday, if you gain over 2 pounds, or you develop in leg swelling, experience more shortness of breath or chest pain, call your Primary MD immediately. Follow Cardiac Low Salt Diet and 1.5 lit/day fluid restriction.  Special Instructions: If you have smoked or chewed Tobacco  in the last 2 yrs please stop smoking, stop any regular Alcohol  and or any Recreational drug use.  On your next visit with your primary care physician please Get Medicines reviewed and adjusted.  Please request your Prim.MD to go over all Hospital Tests and Procedure/Radiological results at the follow up, please get all Hospital records sent to your Prim MD by signing hospital release before you go home.  If you experience worsening of your admission symptoms, develop shortness of breath, life threatening emergency, suicidal or homicidal thoughts you must seek medical attention immediately by calling 911 or calling your MD immediately  if symptoms less severe.  You Must read complete instructions/literature along with all the possible adverse reactions/side effects for all the Medicines you take and that have been prescribed to you. Take any new Medicines after you have completely understood and accpet all the possible adverse reactions/side effects.   Do not drive, operate heavy machinery,  perform activities at heights, swimming or participation in water activities or provide baby sitting services if your were admitted for syncope or siezures until you have seen by Primary MD or a Neurologist and advised to do so again.  Do not drive when taking Pain medications.    Do not take more than prescribed Pain, Sleep and Anxiety Medications  Wear Seat belts while driving.   Please note  You were cared for by a hospitalist during your hospital stay. If you have any questions about your discharge medications or the care you received while you were in the hospital after you are discharged, you can call the unit and asked to speak with the hospitalist on call if the hospitalist that took care of you is not available. Once you are discharged, your primary care physician will handle any further medical issues. Please note that NO REFILLS for any discharge medications will be authorized once you are discharged, as it is imperative that you return to your primary care physician (or establish a relationship with a primary care physician if you do not have one) for your aftercare needs so that they can reassess your need for medications and monitor your lab values.

## 2017-11-25 NOTE — Progress Notes (Signed)
Pt throughout the night took off continuous pulse ox and telemetry. Explained to pt that he needed to leave this monitoring on. Will continue to monitor pt. Ranelle Oyster, RN

## 2017-11-25 NOTE — Op Note (Signed)
[facility] Patient Name: [name] Procedure Date : 11/25/2017 MRN: [id number] Attending MD: Arta Silence , MD Date of Birth: 07/13/1970 CSN: [id number] Age: 47 Admit Type: Inpatient Procedure:                Upper GI endoscopy Indications:              Epigastric abdominal pain, Acute post hemorrhagic                            anemia, Melena Providers:                Arta Silence, MD, Elmer Ramp. Tilden Dome, RN, Nevin Bloodgood, Technician, Clearnce Sorrel, CRNA Referring MD:             [facility] Medicines:                Monitored Anesthesia Care Complications:            No immediate complications. Estimated Blood Loss:     Estimated blood loss: none. Procedure:                Pre-Anesthesia Assessment:                           - Prior to the procedure, a History and Physical                            was performed, and patient medications and                            allergies were reviewed. The patient's tolerance of                            previous anesthesia was also reviewed. The risks                            and benefits of the procedure and the sedation                            options and risks were discussed with the patient.                            All questions were answered, and informed consent                            was obtained. Prior Anticoagulants: The patient has                            taken no previous anticoagulant or antiplatelet                            agents. ASA Grade Assessment: III - A patient with  severe systemic disease. After reviewing the risks                            and benefits, the patient was deemed in                            satisfactory condition to undergo the procedure.                           After obtaining informed consent, the endoscope was                            passed under direct vision. Throughout the        procedure, the patient's blood pressure, pulse, and                            oxygen saturations were monitored continuously. The                            EG-[id number] ([id number]) scope was introduced through the                            mouth, and advanced to the second part of duodenum.                            The upper GI endoscopy was accomplished without                            difficulty. The patient tolerated the procedure                            well. Scope In: Scope Out: Findings:      The examined esophagus was normal.      Patchy mild inflammation was found in the gastric body and in the       gastric antrum. Biopsies were taken with a cold forceps for histology.      A few localized small erosions were found in the prepyloric region of       the stomach. Couple small clean-based pyloric channel ulcers were seen.      The exam of the stomach was otherwise normal. No evidence of gastric       outlet obstruction.      Two non-bleeding cratered duodenal ulcers with pigmented material were       found in the duodenal bulb. The largest lesion was 8 mm in largest       dimension.      Diffuse mildly congested mucosa without active bleeding and with no       stigmata of bleeding was found in the duodenal bulb and in the first       portion of the duodenum.      The exam of the duodenum was otherwise normal.      No old or fresh blood was seen to the extent of our examination. Impression:               - Normal esophagus.                           -  Gastritis. Biopsied.                           - Erosive gastropathy.                           - Small non-bleeding pyloric channel ulcers.                           - Multiple non-bleeding duodenal ulcers with                            pigmented material.                           - Congested duodenal mucosa.                           - Overall GI bleeding most likely from peptic                            ulcers; no  high-risk stigmata seen; overall low                            risk for rebleeding. Moderate Sedation:      None Recommendation:           - Return patient to hospital ward for ongoing care.                           - Full liquid diet today.                           - Continue present medications, including PPI.                           - Await pathology results.                           [facility] will follow. If no further bleeding and                            stable Hgb, likely discharge home tomorrow from GI                            bleeding perspective. Procedure Code(s):        --- Professional ---                           [phone], Esophagogastroduodenoscopy, flexible,                            transoral; with biopsy, single or multiple Diagnosis Code(s):        --- Professional ---                           K29.70, Gastritis, unspecified, without bleeding  K31.89, Other diseases of stomach and duodenum                           K26.9, Duodenal ulcer, unspecified as acute or                            chronic, without hemorrhage or perforation                           R10.13, Epigastric pain                           D62, Acute posthemorrhagic anemia                           K92.1, Melena (includes Hematochezia) CPT copyright 2017 American Medical Association. All rights reserved. The codes documented in this report are preliminary and upon coder review may  be revised to meet current compliance requirements. Arta Silence, MD 11/25/2017 11:33:35 AM This report has been signed electronically. Number of Addenda: 0

## 2017-11-25 NOTE — Transfer of Care (Signed)
Immediate Anesthesia Transfer of Care Note  Patient: Ziad Maye  Procedure(s) Performed: ESOPHAGOGASTRODUODENOSCOPY (EGD) WITH PROPOFOL (N/A ) BIOPSY  Patient Location: Endoscopy Unit  Anesthesia Type:MAC  Level of Consciousness: sedated  Airway & Oxygen Therapy: Patient Spontanous Breathing and Patient connected to nasal cannula oxygen  Post-op Assessment: Report given to RN and Post -op Vital signs reviewed and stable  Post vital signs: Reviewed and stable  Last Vitals:  Vitals Value Taken Time  BP 95/76 11/25/2017 11:30 AM  Temp    Pulse 73 11/25/2017 11:30 AM  Resp 10 11/25/2017 11:30 AM  SpO2 98 % 11/25/2017 11:30 AM  Vitals shown include unvalidated device data.  Last Pain:  Vitals:   11/25/17 1038  TempSrc: Oral  PainSc: 0-No pain      Patients Stated Pain Goal: 3 (68/59/92 3414)  Complications: No apparent anesthesia complications

## 2017-11-25 NOTE — Progress Notes (Signed)
Pt. Being non compliant.  Left floor for 10 to 15 minutes.  He yelled and cursed at his mother in the hallway.  Patient came back to floor.  I hooked his IV back up and he disconnected it again.  Pt is complaining because he wants narcotic pain medicine.  His Morphine was d/c today.

## 2017-11-25 NOTE — Progress Notes (Signed)
Pt. Back from EGD.  Pt is refusing telemetry.

## 2017-11-25 NOTE — Anesthesia Preprocedure Evaluation (Signed)
Anesthesia Evaluation  Patient identified by MRN, date of birth, ID band Patient awake    Reviewed: Allergy & Precautions, NPO status , Patient's Chart, lab work & pertinent test results  Airway Mallampati: II  TM Distance: >3 FB Neck ROM: Full    Dental  (+) Loose,    Pulmonary shortness of breath, asthma , pneumonia, Current Smoker,    breath sounds clear to auscultation       Cardiovascular hypertension,  Rhythm:Regular     Neuro/Psych  Headaches,    GI/Hepatic hiatal hernia, GERD  Medicated,(+)     substance abuse  alcohol use and cocaine use,   Endo/Other  negative endocrine ROS  Renal/GU negative Renal ROS     Musculoskeletal  (+) Arthritis ,   Abdominal   Peds  Hematology  (+) anemia ,   Anesthesia Other Findings   Reproductive/Obstetrics                             Anesthesia Physical Anesthesia Plan  ASA: III  Anesthesia Plan: MAC   Post-op Pain Management:    Induction:   PONV Risk Score and Plan: 0 and Treatment may vary due to age or medical condition  Airway Management Planned: Nasal Cannula  Additional Equipment: None  Intra-op Plan:   Post-operative Plan:   Informed Consent: I have reviewed the patients History and Physical, chart, labs and discussed the procedure including the risks, benefits and alternatives for the proposed anesthesia with the patient or authorized representative who has indicated his/her understanding and acceptance.   Dental advisory given  Plan Discussed with: Surgeon and CRNA  Anesthesia Plan Comments:         Anesthesia Quick Evaluation

## 2017-11-25 NOTE — Progress Notes (Signed)
Told pt multiple times this shift that he can not have anything to eat or drink after midnight. Told pt again while he was walking to the ice machine by the nurse's station with water pitcher that his doctor's orders was nothing by mouth. He continued to go to ice machine and fill up water pitcher with ice and water. Then he went back to his room. Will continue to monitor. Ranelle Oyster, RN

## 2017-11-25 NOTE — Progress Notes (Signed)
Patient attempting several times today to leave AMA. Tried twice to DC IV however patient refused to let me take them out. He proceeded to curse out loud up and down the hall and show out to the point that his family left. He wanted to talk to Dr. Candiss Norse. Paged MD and he spoke with patient in which the patient was asking him for pain meds. I was listening to patient conversation and he told Dr. Candiss Norse that he was a black man and that black men don't  get hooked on stuff like that and turn to Heroin like white men do. Dr. Candiss Norse stated he would call GI and if they say he can go he will discharge him. Return call from Dr. Candiss Norse. Patient [name] is low and he request that we give him mag IV before discharge, then he can go.

## 2017-11-26 ENCOUNTER — Other Ambulatory Visit: Payer: Self-pay

## 2017-11-26 ENCOUNTER — Emergency Department (HOSPITAL_COMMUNITY)
Admission: EM | Admit: 2017-11-26 | Discharge: 2017-11-26 | Disposition: A | Discharge status: Home / Self Care | Payer: Self-pay | Attending: Emergency Medicine | Admitting: Emergency Medicine

## 2017-11-26 ENCOUNTER — Emergency Department (HOSPITAL_COMMUNITY): Payer: Self-pay

## 2017-11-26 DIAGNOSIS — F17210 Nicotine dependence, cigarettes, uncomplicated: Secondary | ICD-10-CM | POA: Insufficient documentation

## 2017-11-26 DIAGNOSIS — K2950 Unspecified chronic gastritis without bleeding: Secondary | ICD-10-CM | POA: Insufficient documentation

## 2017-11-26 DIAGNOSIS — Z8719 Personal history of other diseases of the digestive system: Secondary | ICD-10-CM

## 2017-11-26 DIAGNOSIS — Z79899 Other long term (current) drug therapy: Secondary | ICD-10-CM | POA: Insufficient documentation

## 2017-11-26 DIAGNOSIS — I10 Essential (primary) hypertension: Secondary | ICD-10-CM | POA: Insufficient documentation

## 2017-11-26 DIAGNOSIS — J45909 Unspecified asthma, uncomplicated: Secondary | ICD-10-CM | POA: Insufficient documentation

## 2017-11-26 LAB — I-STAT TROPONIN, ED: TROPONIN I, POC: 0.01 ng/mL (ref 0.00–0.08)

## 2017-11-26 LAB — BASIC METABOLIC PANEL
Anion gap: 8 (ref 5–15)
CALCIUM: 8.7 mg/dL — AB (ref 8.9–10.3)
CO2: 27 mmol/L (ref 22–32)
CREATININE: 0.62 mg/dL (ref 0.61–1.24)
Chloride: 108 mmol/L (ref 98–111)
GFR calc non Af Amer: >60 mL/min (ref >60)
Glucose, Bld: 89 mg/dL (ref 70–99)
Potassium: 3.8 mmol/L (ref 3.5–5.1)
SODIUM: 143 mmol/L (ref 135–145)

## 2017-11-26 LAB — CBC
HCT: 29.1 % — ABNORMAL LOW (ref 39.0–52.0)
Hemoglobin: 9.1 g/dL — ABNORMAL LOW (ref 13.0–17.0)
MCH: 24.8 pg — AB (ref 26.0–34.0)
MCHC: 31.3 g/dL (ref 30.0–36.0)
MCV: 79.3 fL (ref 78.0–100.0)
PLATELETS: 283 10*3/uL (ref 150–400)
RBC: 3.67 MIL/uL — AB (ref 4.22–5.81)
RDW: 21.5 % — AB (ref 11.5–15.5)
WBC: 10.4 10*3/uL (ref 4.0–10.5)

## 2017-11-26 MED ORDER — FAMOTIDINE 20 MG PO TABS: 20.0000 mg | ORAL_TABLET | Freq: Two times a day (BID) | ORAL | 0 refills | Status: DC

## 2017-11-26 MED ORDER — SODIUM CHLORIDE 0.9 % IV BOLUS
1000.0000 mL | Freq: Once | INTRAVENOUS | Status: AC
  Administered 2017-11-26: 1000 mL via INTRAVENOUS

## 2017-11-26 MED ORDER — FAMOTIDINE IN NACL 20-0.9 MG/50ML-% IV SOLN
20.0000 mg | Freq: Once | INTRAVENOUS | Status: AC
  Administered 2017-11-26: 20 mg via INTRAVENOUS
  Filled 2017-11-26: qty 50

## 2017-11-26 MED ORDER — TRAMADOL HCL 50 MG PO TABS
100.0000 mg | ORAL_TABLET | Freq: Once | ORAL | Status: AC
  Administered 2017-11-26: 100 mg via ORAL
  Filled 2017-11-26: qty 2

## 2017-11-26 MED ORDER — GI COCKTAIL ~~LOC~~
30.0000 mL | Freq: Once | ORAL | Status: AC
  Administered 2017-11-26: 30 mL via ORAL
  Filled 2017-11-26: qty 30

## 2017-11-26 MED ORDER — SUCRALFATE 1 G PO TABS
1.0000 g | ORAL_TABLET | Freq: Once | ORAL | Status: AC
  Administered 2017-11-26: 1 g via ORAL
  Filled 2017-11-26: qty 1

## 2017-11-26 MED ORDER — TRAMADOL HCL 50 MG PO TABS: ORAL_TABLET | ORAL | 0 refills | Status: DC

## 2017-11-26 MED ORDER — PANTOPRAZOLE SODIUM 40 MG IV SOLR
40.0000 mg | Freq: Once | INTRAVENOUS | Status: AC
  Administered 2017-11-26: 40 mg via INTRAVENOUS
  Filled 2017-11-26: qty 40

## 2017-11-26 NOTE — ED Notes (Signed)
Pt back from x-ray.

## 2017-11-26 NOTE — ED Notes (Signed)
Patient verbalizes understanding of discharge instructions. Opportunity for questioning and answers were provided. Armband removed by staff, pt discharged from ED.  

## 2017-11-26 NOTE — ED Notes (Signed)
Patient transported to X-ray 

## 2017-11-26 NOTE — ED Notes (Signed)
Pt is trying to go out to lobby to see daughter. EMT Kaizen Ibsen advised him that his daughter could come back to him.

## 2017-11-26 NOTE — ED Triage Notes (Signed)
Pt brought in by [facility] from [facility] abdominal pain x4 days, pt seen recently and given x2 units of blood. On arrival pt c/o CP. Pt ambulatory to room. Pt has hx of pancreatitis.

## 2017-11-26 NOTE — Discharge Instructions (Addendum)
You have known, frequent abdominal pain.  You recently had duodenal ulcers with bleeding.  Take Protonix daily as prescribed.  Fill your prescription for Carafate and initiate that as well.  Take Pepcid 20 mg twice daily.  You may take tramadol 1 to 2 tablets every 6 hours for additional pain control.  Follow-up with a gastroenterologist as soon as possible.  You should see a family doctor as well.  Call your family doctor, if you do not have one, use the referral number in your discharge instructions.

## 2017-11-26 NOTE — ED Notes (Signed)
EDP at bedside  

## 2017-11-26 NOTE — ED Provider Notes (Signed)
[facility] EMERGENCY DEPARTMENT Provider Note   CSN: [id number] Arrival date & time: 11/26/17  1215     History   Chief Complaint Chief Complaint  Patient presents with  . Abdominal Pain  . Chest Pain    HPI [name] is a 47 y.o. male.  HPI Patient reports he is having ongoing abdominal pain.  Central and sharp and slightly to the left his upper abdomen.  Reports he is taking his Protonix but has not yet been able to get the Carafate.  He denies he had vomiting.  He has not seen any blood.  Was recently admitted for blood transfusion for GI bleeding.  No fever no chills no diarrhea. Past Medical History:  Diagnosis Date  . Alcoholism /alcohol abuse ([place])   . Anemia   . Anxiety   . Arthritis    "knees; arms; elbows" (03/26/2015)  . Asthma   . Bipolar disorder ([place])   . Chronic bronchitis ([place])   . Chronic lower back pain   . Chronic pancreatitis ([place])   . Cocaine abuse ([place])   . Depression   . Family history of adverse reaction to anesthesia    "grandmother gets confused"  . Femoral condyle fracture ([place]) 03/08/2014   left medial/notes 03/09/2014  . GERD (gastroesophageal reflux disease)   . H/O hiatal hernia   . H/O suicide attempt 10/2012  . Heart murmur    "when he was little" (03/06/2013)  . High cholesterol   . History of blood transfusion 10/2012   "when I tried to commit suicide"  . History of stomach ulcers   . Hypertension   . Marijuana abuse, continuous   . Migraine    "a few times/year" (03/26/2015)  . Pneumonia 1990's X 3  . PTSD (post-traumatic stress disorder)   . Shortness of breath    "can happen at anytime" (03/06/2013)  . Sickle cell trait ([place])   . WPW (Wolff-Parkinson-White syndrome)    [name] 03/06/2013    Patient Active Problem List   Diagnosis Date Noted  . GI bleed 11/24/2017  . Acute blood loss anemia 11/24/2017  . Atypical chest pain 11/24/2017  . Acute pancreatitis 09/28/2017  . Abdominal pain  05/27/2017  . Hematemesis 05/27/2017  . Tachycardia 03/18/2017  . Diarrhea 03/18/2017  . Acute on chronic pancreatitis ([place]) 12/17/2016  . Intractable nausea and vomiting 12/05/2016  . Verbally abusive behavior 12/05/2016  . Normocytic anemia 12/05/2016  . Alcohol use disorder, severe, dependence ([place]) 07/25/2016  . Cocaine use disorder, severe, dependence ([place]) 07/25/2016  . Major depressive disorder, recurrent severe without psychotic features ([place]) 07/20/2016  . Leukocytosis   . Hospital acquired PNA 05/20/2015  . Chronic pancreatitis ([place]) 05/18/2015  . Pseudocyst of pancreas 05/18/2015  . Polysubstance abuse (tobacco, cocaine, THC, and ETOH) 03/26/2015  . Alcohol-induced chronic pancreatitis ([place])   . Benign essential HTN 02/06/2014  . Alcohol-induced acute pancreatitis 11/28/2013  . Pancreatic pseudocyst/cyst 11/25/2013  . Severe protein-calorie malnutrition ([place]) 10/10/2013  . Suicide attempt ([place]) 10/08/2013  . 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 TOBACCO ABUSE 03/23/2007    Past Surgical History:  Procedure Laterality Date  . CARDIAC CATHETERIZATION    . EYE SURGERY Left 1990's   "result of trauma"   . FACIAL FRACTURE SURGERY Left 1990's   "result of trauma"   . FRACTURE SURGERY    . HERNIA REPAIR    . LEFT HEART CATHETERIZATION WITH CORONARY ANGIOGRAM Right 03/07/2013   Procedure:  LEFT HEART CATHETERIZATION WITH CORONARY ANGIOGRAM;  Surgeon: [name], MD;  Location: [facility] CATH LAB;  Service: Cardiovascular;  Laterality: Right;  . UMBILICAL HERNIA REPAIR          Home Medications    Prior to Admission medications   Medication Sig Start Date End Date Taking? Authorizing Provider  acetaminophen (TYLENOL) 500 MG tablet Take 1,000 mg by mouth every 4 (four) hours as needed for moderate pain.   Yes [provider]  albuterol (PROVENTIL HFA;VENTOLIN HFA) 108 (90 Base) MCG/ACT inhaler Inhale 2 puffs into the lungs every 6  (six) hours as needed for wheezing or shortness of breath. 08/29/17  Yes Clent Demark, PA-C  folic acid (FOLVITE) 1 MG tablet Take 1 tablet (1 mg total) by mouth daily. 11/26/17  Yes [name], MD  hydrALAZINE (APRESOLINE) 25 MG tablet Take 1 tablet (25 mg total) by mouth every 8 (eight) hours. Patient taking differently: Take 25 mg by mouth 3 (three) times daily.  08/29/17  Yes Clent Demark, PA-C  hydrOXYzine (VISTARIL) 50 MG capsule Take 50 mg by mouth 3 (three) times daily as needed for anxiety.    Yes [provider]  lipase/protease/amylase (CREON) 12000 units CPEP capsule Take 1 capsule (12,000 Units total) by mouth 3 (three) times daily with meals. For pancreatitis 10/01/17  Yes Aline August, MD  loratadine (CLARITIN) 10 MG tablet Take 1 tablet (10 mg total) by mouth daily as needed for allergies or rhinitis. (May purchase from over the counter): For allergies 10/01/17  Yes Aline August, MD  metoprolol tartrate (LOPRESSOR) 25 MG tablet Take 1 tablet (25 mg total) by mouth 2 (two) times daily. For high blood pressure 08/29/17  Yes Clent Demark, PA-C  Multiple Vitamin (MULTIVITAMIN WITH MINERALS) TABS tablet Take 1 tablet by mouth daily. 09/06/17  Yes Bonnell Public, MD  ondansetron (ZOFRAN) 4 MG tablet Take 1 tablet (4 mg total) by mouth every 8 (eight) hours as needed for nausea or vomiting. 11/02/17  Yes McDonald, Mia A, PA-C  pantoprazole (PROTONIX) 40 MG tablet Take 1 tablet (40 mg total) by mouth 2 (two) times daily. 11/25/17  Yes [name], MD  sertraline (ZOLOFT) 100 MG tablet Take 1 tablet (100 mg total) by mouth daily. 08/29/17  Yes Clent Demark, PA-C  sucralfate (CARAFATE) 1 g tablet Take 1 tablet (1 g total) by mouth 4 (four) times daily. 11/25/17  Yes [name], MD  thiamine 100 MG tablet Take 1 tablet (100 mg total) by mouth daily. 11/26/17  Yes [name], MD  famotidine (PEPCID) 20 MG tablet Take 1 tablet (20 mg total) by  mouth 2 (two) times daily. 11/26/17   Charlesetta Shanks, MD  traMADol Veatrice Bourbon) 50 MG tablet 1-2 every 6 hours as needed for pain 11/26/17   Charlesetta Shanks, MD  omeprazole (PRILOSEC) 20 MG capsule Take 1 capsule (20 mg total) daily by mouth. 03/22/17 06/21/17  Varney Biles, MD    Family History Family History  Problem Relation Age of Onset  . Hypertension Mother   . Cirrhosis Mother   . Alcoholism Mother   . Hypertension Father   . Melanoma Father   . Hypertension Other   . Coronary artery disease Other     Social History Social History   Tobacco Use  . Smoking status: Current Every Day Smoker    Packs/day: 1.00    Years: 33.00    Pack years: 33.00    Types:  Cigarettes, E-cigarettes  . Smokeless tobacco: Never Used  Substance Use Topics  . Alcohol use: Yes  . Drug use: Yes    Types: Marijuana, Cocaine    Comment: daily marijuana use; last cocaine use about 3 months ago     Allergies   Robaxin [methocarbamol]; Shellfish-derived products; Trazodone; Trazodone and nefazodone; Contrast media [iodinated diagnostic agents]; and Reglan [metoclopramide]   Review of Systems Review of Systems 10 Systems reviewed and are negative for acute change except as noted in the HPI.  Physical Exam Updated Vital Signs BP (!) 148/91 (BP Location: Right Arm)   Pulse 72   Temp 97.7 F (36.5 C)   Resp 18   Ht 5\' 9"  (1.753 m)   Wt 65.8 kg (145 lb)   SpO2 100%   BMI 21.41 kg/m   Physical Exam  Constitutional: He is oriented to person, place, and time.  Is alert and nontoxic.  He is clinically well in appearance.  He has been up and ambulatory about the emergency department about the halls etc. prior to evaluation.  Objectively does not appear to be in distress.  HENT:  Head: Normocephalic and atraumatic.  Eyes: EOM are normal.  Cardiovascular: Normal rate, regular rhythm, normal heart sounds and intact distal pulses.  Pulmonary/Chest: Effort normal and breath sounds normal.    Abdominal: Soft. Bowel sounds are normal. He exhibits no distension. There is tenderness. There is no guarding.  Patient endorses tenderness in the mid and upper abdomen.  No guarding.  Musculoskeletal: Normal range of motion. He exhibits no edema or tenderness.  Patient has been seen ambulating with coordinated nonantalgic gait.  Neurological: He is alert and oriented to person, place, and time. He exhibits normal muscle tone. Coordination normal.  Skin: Skin is warm and dry.  Psychiatric: He has a normal mood and affect.     ED Treatments / Results  Labs (all labs ordered are listed, but only abnormal results are displayed) Labs Reviewed  BASIC METABOLIC PANEL - Abnormal; Notable for the following components:      Result Value   BUN <5 (*)    Calcium 8.7 (*)    All other components within normal limits  CBC - Abnormal; Notable for the following components:   RBC 3.67 (*)    Hemoglobin 9.1 (*)    HCT 29.1 (*)    MCH 24.8 (*)    RDW 21.5 (*)    All other components within normal limits  I-STAT TROPONIN, ED    EKG EKG Interpretation  Date/Time:  Sunday November 26 2017 12:25:51 EDT Ventricular Rate:  101 PR Interval:    QRS Duration: 80 QT Interval:  315 QTC Calculation: 409 R Axis:   57 Text Interpretation:  Sinus tachycardia LAE, consider biatrial enlargement Abnormal R-wave progression, early transition Abnormal T, consider ischemia, diffuse leads no change from previous Confirmed by Charlesetta Shanks [phone]) on 11/26/2017 4:07:17 PM   Radiology Dg Chest 2 View  Result Date: 11/26/2017 CLINICAL DATA:  Central chest pain, LEFT lower quadrant pain EXAM: CHEST - 2 VIEW COMPARISON:  None. FINDINGS: Normal mediastinum and cardiac silhouette. Normal pulmonary vasculature. No evidence of effusion, infiltrate, or pneumothorax. No acute bony abnormality. IMPRESSION: Normal chest radiograph.  Is Electronically Signed   By: Suzy Bouchard M.D.   On: 11/26/2017 13:57     Procedures Procedures (including critical care time)  Medications Ordered in ED Medications  famotidine (PEPCID) IVPB 20 mg premix (20 mg Intravenous New Bag/Given 11/26/17 1653)  sucralfate (  CARAFATE) tablet 1 g (1 g Oral Given 11/26/17 1652)  pantoprazole (PROTONIX) injection 40 mg (40 mg Intravenous Given 11/26/17 1724)  sodium chloride 0.9 % bolus 1,000 mL (1,000 mLs Intravenous New Bag/Given 11/26/17 1653)  gi cocktail (Maalox,Lidocaine,Donnatal) (30 mLs Oral Given 11/26/17 1652)  traMADol (ULTRAM) tablet 100 mg (100 mg Oral Given 11/26/17 1652)     Initial Impression / Assessment and Plan / ED Course  I have reviewed the triage vital signs and the nursing notes.  Pertinent labs & imaging results that were available during my care of the patient were reviewed by me and considered in my medical decision making (see chart for details).      Final Clinical Impressions(s) / ED Diagnoses   Final diagnoses:  Chronic gastritis without bleeding, unspecified gastritis type  H/O: upper GI bleed   Patient has history of chronic abdominal pain with prior history of pancreatitis and frequent presentations with abdominal pain.  Most recently patient had GI bleed and required transfusion.  Nonbleeding duodenal ulcers identified and gastritis identified by EGD done yesterday,per report overall low risk for rebleeding.  Today, patient's hemoglobin is stable.  He has no hypotension or tachycardia.  He does not report any emesis.  Patient was treated for pain with gastritis with Protonix, Pepcid, Gi cocktail, Carafate and tramadol.  I do feel patient stable for continued outpatient management.  Had EGD yesterday and hemoglobin is stable today.  He is counseled to fill his Carafate as soon as possible, continue his prescribed Protonix, add Pepcid and take tramadol if needed for additional pain control.  He is counseled to make his appointment with GI for follow-up. ED Discharge Orders        Ordered     famotidine (PEPCID) 20 MG tablet  2 times daily     11/26/17 1757    traMADol (ULTRAM) 50 MG tablet     11/26/17 1757       Charlesetta Shanks, MD 11/26/17 1813

## 2017-11-27 ENCOUNTER — Encounter (HOSPITAL_COMMUNITY): Payer: Self-pay | Admitting: Gastroenterology

## 2017-11-27 LAB — BPAM RBC
BLOOD PRODUCT EXPIRATION DATE: 201908192359
BLOOD PRODUCT EXPIRATION DATE: 201908192359
BLOOD PRODUCT EXPIRATION DATE: 201908212359
Blood Product Expiration Date: 201908212359
ISSUE DATE / TIME: 201907190011
ISSUE DATE / TIME: 201907190330
UNIT TYPE AND RH: 7300
Unit Type and Rh: 7300
Unit Type and Rh: 7300
Unit Type and Rh: 7300

## 2017-11-27 LAB — TYPE AND SCREEN
ABO/RH(D): B POS
Antibody Screen: NEGATIVE
UNIT DIVISION: 0
Unit division: 0
Unit division: 0
Unit division: 0

## 2017-11-28 IMAGING — CR DG ABDOMEN ACUTE W/ 1V CHEST
3 series · 3 of 3 positions shown · non-contrast
Comparison: Chest x-ray 05/21/2015.  Abdominal CT 05/18/2015.

CLINICAL DATA: Left-sided abdominal pain with nausea and vomiting

EXAM:
DG ABDOMEN ACUTE W/ 1V CHEST

[chest pa]
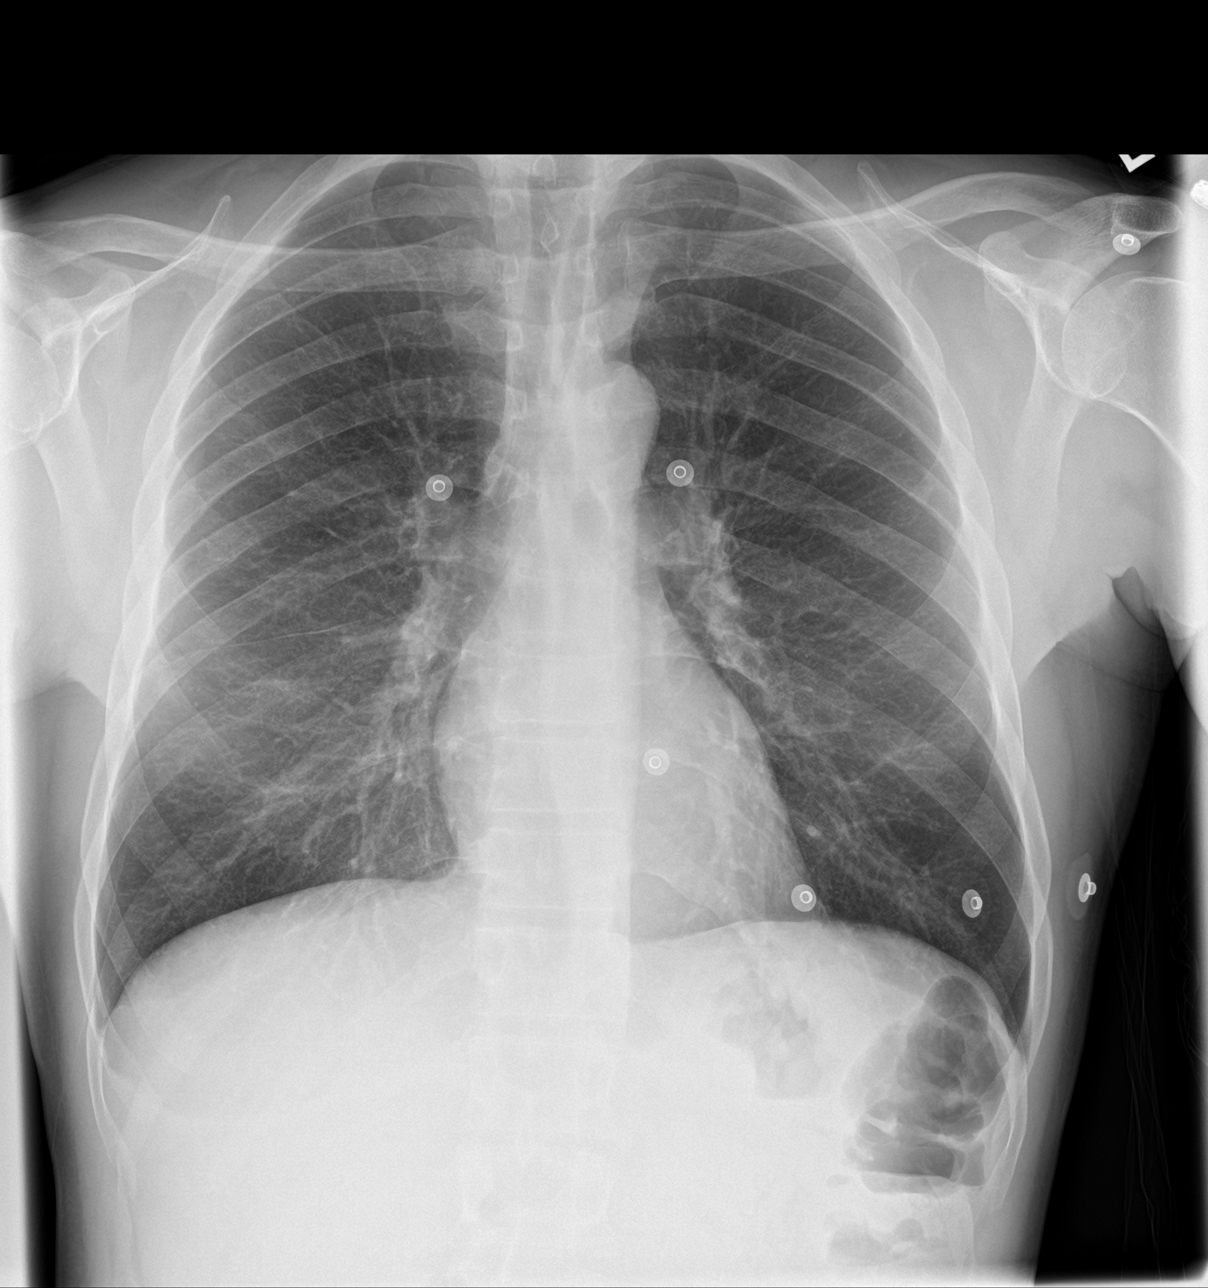

[abdomen erect]
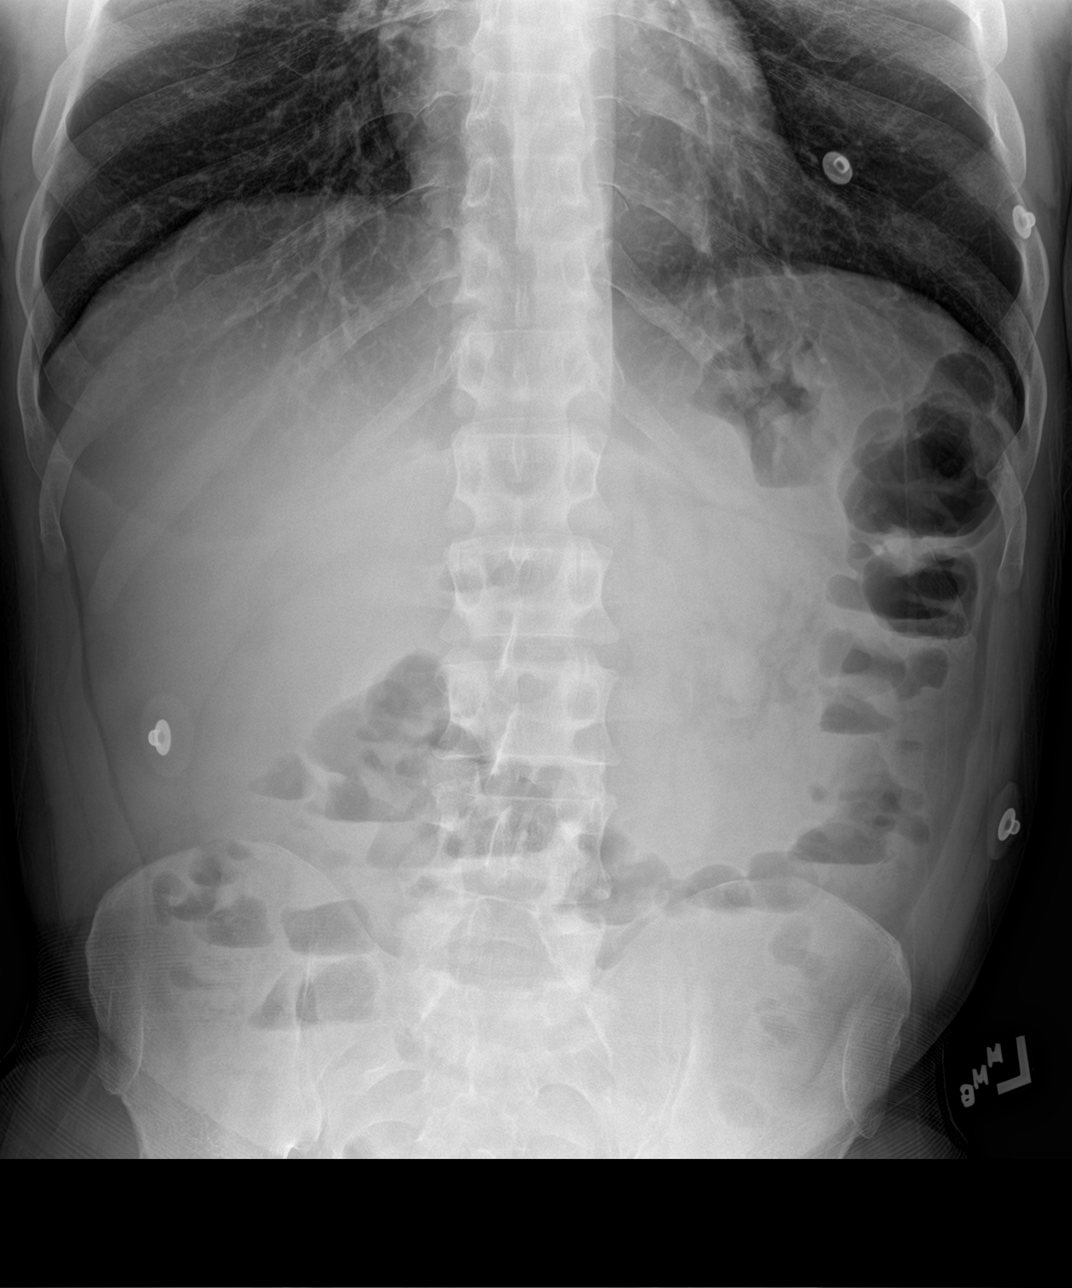

[abdomen supine]
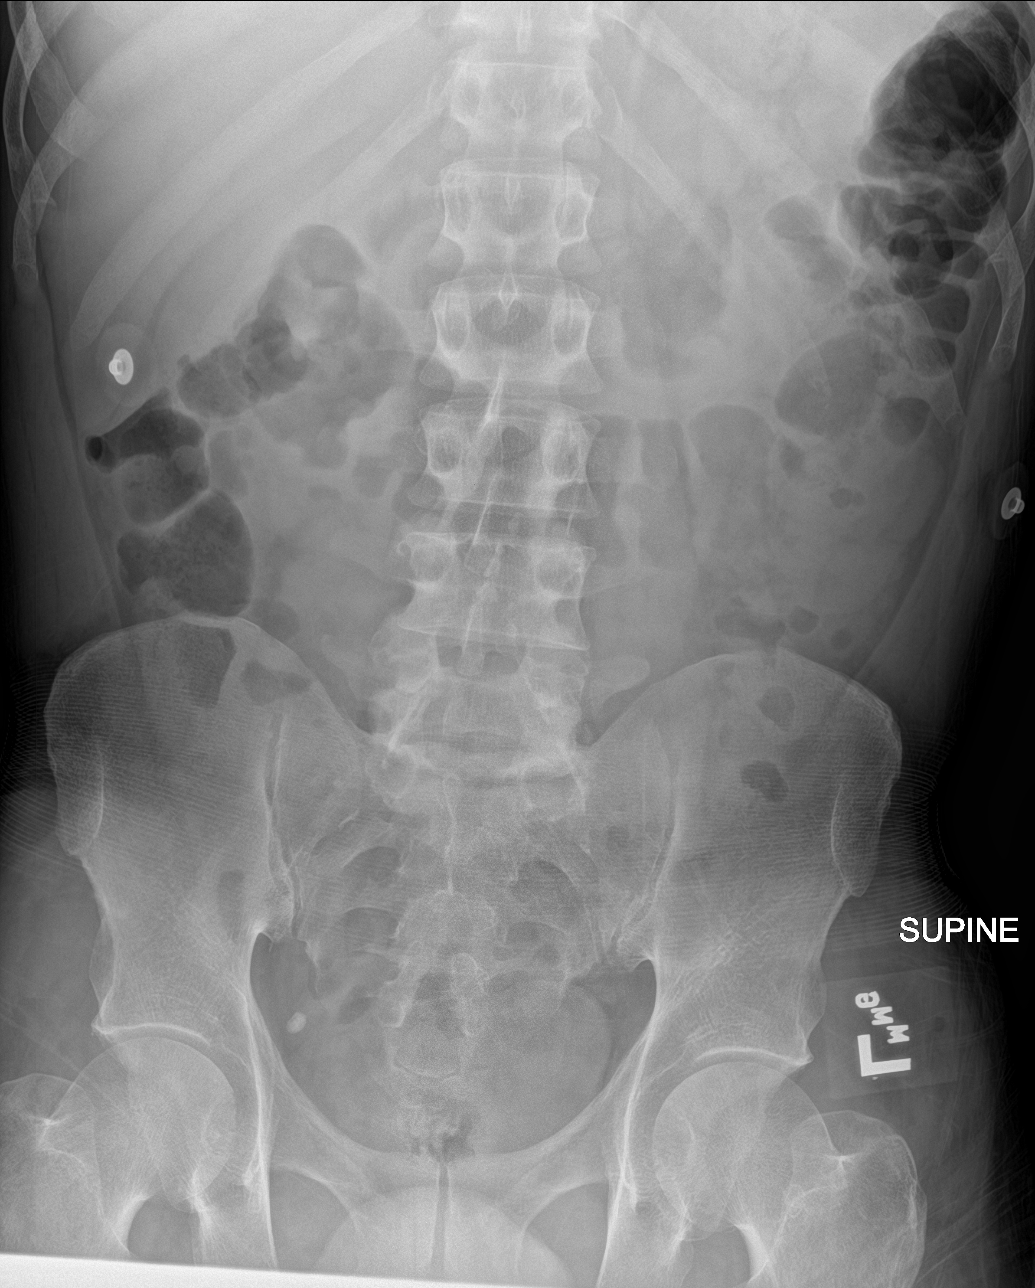

[3 of 3 positions shown; findings below may reference images not displayed]

FINDINGS: There is no evidence of dilated bowel loops or free intraperitoneal
air. No radiopaque calculi or other significant radiographic
abnormality is seen. Heart size and mediastinal contours are within
normal limits. Both lungs are clear.

Lumbosacral degenerative disc disease.
IMPRESSION: Negative abdominal radiographs.  No acute cardiopulmonary disease.

## 2017-12-03 ENCOUNTER — Emergency Department (HOSPITAL_COMMUNITY)
Admission: EM | Admit: 2017-12-03 | Discharge: 2017-12-04 | Disposition: A | Discharge status: Home / Self Care | Payer: Self-pay | Attending: Emergency Medicine | Admitting: Emergency Medicine

## 2017-12-03 ENCOUNTER — Other Ambulatory Visit: Payer: Self-pay

## 2017-12-03 DIAGNOSIS — D649 Anemia, unspecified: Secondary | ICD-10-CM

## 2017-12-03 DIAGNOSIS — I10 Essential (primary) hypertension: Secondary | ICD-10-CM | POA: Insufficient documentation

## 2017-12-03 DIAGNOSIS — K219 Gastro-esophageal reflux disease without esophagitis: Secondary | ICD-10-CM

## 2017-12-03 DIAGNOSIS — F17210 Nicotine dependence, cigarettes, uncomplicated: Secondary | ICD-10-CM | POA: Insufficient documentation

## 2017-12-03 DIAGNOSIS — R1010 Upper abdominal pain, unspecified: Secondary | ICD-10-CM

## 2017-12-03 DIAGNOSIS — Z79899 Other long term (current) drug therapy: Secondary | ICD-10-CM | POA: Insufficient documentation

## 2017-12-03 NOTE — ED Triage Notes (Signed)
Pt BIB [facility] for eval of bloody diarrhea and abd pain. Pt is frequently seen for abd pain however reports he was recently transfused approx 8 days ago for lower GI bleed here.

## 2017-12-04 ENCOUNTER — Encounter (HOSPITAL_COMMUNITY): Payer: Self-pay

## 2017-12-04 LAB — DIFFERENTIAL
Abs Immature Granulocytes: 0.1 10*3/uL (ref 0.0–0.1)
BASOS ABS: 0 10*3/uL (ref 0.0–0.1)
BASOS PCT: 0 %
EOS PCT: 3 %
Eosinophils Absolute: 0.4 10*3/uL (ref 0.0–0.7)
Immature Granulocytes: 1 %
Lymphocytes Relative: 18 %
Lymphs Abs: 2.3 10*3/uL (ref 0.7–4.0)
MONO ABS: 1.1 10*3/uL — AB (ref 0.1–1.0)
MONOS PCT: 9 %
NEUTROS ABS: 8.6 10*3/uL — AB (ref 1.7–7.7)
NEUTROS PCT: 69 %

## 2017-12-04 LAB — CBC
HEMATOCRIT: 38.5 % — AB (ref 39.0–52.0)
HEMOGLOBIN: 12.2 g/dL — AB (ref 13.0–17.0)
MCH: 26.2 pg (ref 26.0–34.0)
MCHC: 31.7 g/dL (ref 30.0–36.0)
MCV: 82.6 fL (ref 78.0–100.0)
Platelets: 307 10*3/uL (ref 150–400)
RBC: 4.66 MIL/uL (ref 4.22–5.81)
RDW: 25.2 % — ABNORMAL HIGH (ref 11.5–15.5)
WBC: 12.6 10*3/uL — ABNORMAL HIGH (ref 4.0–10.5)

## 2017-12-04 LAB — COMPREHENSIVE METABOLIC PANEL
ALBUMIN: 3.8 g/dL (ref 3.5–5.0)
ALK PHOS: 93 U/L (ref 38–126)
ALT: 33 U/L (ref 0–44)
ANION GAP: 13 (ref 5–15)
AST: 35 U/L (ref 15–41)
BILIRUBIN TOTAL: 0.8 mg/dL (ref 0.3–1.2)
BUN: 7 mg/dL (ref 6–20)
CALCIUM: 9.2 mg/dL (ref 8.9–10.3)
CO2: 18 mmol/L — ABNORMAL LOW (ref 22–32)
Chloride: 103 mmol/L (ref 98–111)
Creatinine, Ser: 0.89 mg/dL (ref 0.61–1.24)
Glucose, Bld: 112 mg/dL — ABNORMAL HIGH (ref 70–99)
POTASSIUM: 3.6 mmol/L (ref 3.5–5.1)
Sodium: 134 mmol/L — ABNORMAL LOW (ref 135–145)
TOTAL PROTEIN: 7.8 g/dL (ref 6.5–8.1)

## 2017-12-04 LAB — TYPE AND SCREEN
ABO/RH(D): B POS
Antibody Screen: NEGATIVE

## 2017-12-04 LAB — POC OCCULT BLOOD, ED: Fecal Occult Bld: NEGATIVE

## 2017-12-04 LAB — LIPASE, BLOOD: Lipase: 41 U/L (ref 11–51)

## 2017-12-04 MED ORDER — GI COCKTAIL ~~LOC~~
30.0000 mL | Freq: Once | ORAL | Status: AC
  Administered 2017-12-04: 30 mL via ORAL
  Filled 2017-12-04: qty 30

## 2017-12-04 MED ORDER — PANTOPRAZOLE SODIUM 40 MG PO TBEC: 40.0000 mg | DELAYED_RELEASE_TABLET | Freq: Two times a day (BID) | ORAL | 2 refills | Status: DC

## 2017-12-04 MED ORDER — ONDANSETRON HCL 4 MG PO TABS: 4.0000 mg | ORAL_TABLET | Freq: Three times a day (TID) | ORAL | 0 refills | Status: DC | PRN

## 2017-12-04 MED ORDER — FAMOTIDINE IN NACL 20-0.9 MG/50ML-% IV SOLN
20.0000 mg | Freq: Once | INTRAVENOUS | Status: AC
  Administered 2017-12-04: 20 mg via INTRAVENOUS
  Filled 2017-12-04: qty 50

## 2017-12-04 MED ORDER — PROCHLORPERAZINE EDISYLATE 10 MG/2ML IJ SOLN
10.0000 mg | Freq: Once | INTRAMUSCULAR | Status: AC
  Administered 2017-12-04: 10 mg via INTRAVENOUS
  Filled 2017-12-04: qty 2

## 2017-12-04 MED ORDER — SODIUM CHLORIDE 0.9 % IV BOLUS
1000.0000 mL | Freq: Once | INTRAVENOUS | Status: AC
Start: 2017-12-04 — End: 2017-12-04
  Administered 2017-12-04: 1000 mL via INTRAVENOUS

## 2017-12-04 MED ORDER — PANTOPRAZOLE SODIUM 40 MG PO TBEC
40.0000 mg | DELAYED_RELEASE_TABLET | Freq: Once | ORAL | Status: AC
  Administered 2017-12-04: 40 mg via ORAL
  Filled 2017-12-04: qty 1

## 2017-12-04 MED ORDER — FAMOTIDINE 20 MG PO TABS: 20.0000 mg | ORAL_TABLET | Freq: Two times a day (BID) | ORAL | 0 refills | Status: DC

## 2017-12-04 MED ORDER — MORPHINE SULFATE (PF) 4 MG/ML IV SOLN
4.0000 mg | Freq: Once | INTRAVENOUS | Status: AC
  Administered 2017-12-04: 4 mg via INTRAVENOUS
  Filled 2017-12-04: qty 1

## 2017-12-04 MED ORDER — SUCRALFATE 1 G PO TABS: 1.0000 g | ORAL_TABLET | Freq: Four times a day (QID) | ORAL | 0 refills | Status: DC

## 2017-12-04 NOTE — Discharge Instructions (Addendum)
Return if you start vomiting blood or passing blood in your stool.

## 2017-12-04 NOTE — ED Notes (Signed)
Pt discharged from ED; instructions provided and scripts given; Pt encouraged to return to ED if symptoms worsen and to f/u with PCP; Pt verbalized understanding of all instructions 

## 2017-12-04 NOTE — ED Provider Notes (Signed)
[facility] EMERGENCY DEPARTMENT Provider Note   CSN: [id number] Arrival date & time: 12/03/17  2340     History   Chief Complaint Chief Complaint  Patient presents with  . Abdominal Pain    HPI [name] is a 47 y.o. male.   The history is provided by the patient.   Abdominal Pain    He has history of hypertension, chronic pancreatitis, alcohol abuse, GI bleed and comes in because of epigastric pain and rectal bleeding.  He states that this afternoon, he started having severe epigastric pain with slight radiation to the back.  Pain is different from his pancreatitis pain and he rates it at 9/10.  There has been associated nausea and vomiting.  He has not seen any blood in his emesis.  However, he states he has been having bright red blood and dark red blood in his stool today.  He was recently admitted to the hospital for a GI bleed, and states that endoscopy showed he had some ulcers.  He denies current alcohol use, and denies current NSAID use.  Past Medical History:  Diagnosis Date  . Alcoholism /alcohol abuse ([place])   . Anemia   . Anxiety   . Arthritis    "knees; arms; elbows" (03/26/2015)  . Asthma   . Bipolar disorder ([place])   . Chronic bronchitis ([place])   . Chronic lower back pain   . Chronic pancreatitis ([place])   . Cocaine abuse ([place])   . Depression   . Family history of adverse reaction to anesthesia    "grandmother gets confused"  . Femoral condyle fracture ([place]) 03/08/2014   left medial/notes 03/09/2014  . GERD (gastroesophageal reflux disease)   . H/O hiatal hernia   . H/O suicide attempt 10/2012  . Heart murmur    "when he was little" (03/06/2013)  . High cholesterol   . History of blood transfusion 10/2012   "when I tried to commit suicide"  . History of stomach ulcers   . Hypertension   . Marijuana abuse, continuous   . Migraine    "a few times/year" (03/26/2015)  . Pneumonia 1990's X 3  . PTSD (post-traumatic stress  disorder)   . Shortness of breath    "can happen at anytime" (03/06/2013)  . Sickle cell trait ([place])   . WPW (Wolff-Parkinson-White syndrome)    [name] 03/06/2013    Patient Active Problem List   Diagnosis Date Noted  . GI bleed 11/24/2017  . Acute blood loss anemia 11/24/2017  . Atypical chest pain 11/24/2017  . Acute pancreatitis 09/28/2017  . Abdominal pain 05/27/2017  . Hematemesis 05/27/2017  . Tachycardia 03/18/2017  . Diarrhea 03/18/2017  . Acute on chronic pancreatitis ([place]) 12/17/2016  . Intractable nausea and vomiting 12/05/2016  . Verbally abusive behavior 12/05/2016  . Normocytic anemia 12/05/2016  . Alcohol use disorder, severe, dependence ([place]) 07/25/2016  . Cocaine use disorder, severe, dependence ([place]) 07/25/2016  . 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 Leukocytosis   . Hospital acquired PNA 05/20/2015  . Chronic pancreatitis ([place]) 05/18/2015  . Pseudocyst of pancreas 05/18/2015  . Polysubstance abuse (tobacco, cocaine, THC, and ETOH) 03/26/2015  . Alcohol-induced chronic pancreatitis ([place])   . Benign essential HTN 02/06/2014  . Alcohol-induced acute pancreatitis 11/28/2013  . Pancreatic pseudocyst/cyst 11/25/2013  . Severe protein-calorie malnutrition ([place]) 10/10/2013  . Suicide attempt ([place]) 10/08/2013  . 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 TOBACCO ABUSE 03/23/2007  Past Surgical History:  Procedure Laterality Date  . BIOPSY  11/25/2017   Procedure: BIOPSY;  Surgeon: Arta Silence, MD;  Location: [facility];  Service: Endoscopy;;  . CARDIAC CATHETERIZATION    . ESOPHAGOGASTRODUODENOSCOPY (EGD) WITH PROPOFOL N/A 11/25/2017   Procedure: ESOPHAGOGASTRODUODENOSCOPY (EGD) WITH PROPOFOL;  Surgeon: Arta Silence, MD;  Location: [facility];  Service: Endoscopy;  Laterality: N/A;  . EYE SURGERY Left 1990's   "result of trauma"   . FACIAL FRACTURE SURGERY Left 1990's    "result of trauma"   . FRACTURE SURGERY    . HERNIA REPAIR    . LEFT HEART CATHETERIZATION WITH CORONARY ANGIOGRAM Right 03/07/2013   Procedure: LEFT HEART CATHETERIZATION WITH CORONARY ANGIOGRAM;  Surgeon: [name], MD;  Location: [facility] CATH LAB;  Service: Cardiovascular;  Laterality: Right;  . UMBILICAL HERNIA REPAIR          Home Medications    Prior to Admission medications   Medication Sig Start Date End Date Taking? Authorizing Provider  acetaminophen (TYLENOL) 500 MG tablet Take 1,000 mg by mouth every 4 (four) hours as needed for moderate pain.    [provider]  albuterol (PROVENTIL HFA;VENTOLIN HFA) 108 (90 Base) MCG/ACT inhaler Inhale 2 puffs into the lungs every 6 (six) hours as needed for wheezing or shortness of breath. 08/29/17   Clent Demark, PA-C  famotidine (PEPCID) 20 MG tablet Take 1 tablet (20 mg total) by mouth 2 (two) times daily. 11/26/17   Charlesetta Shanks, MD  folic acid (FOLVITE) 1 MG tablet Take 1 tablet (1 mg total) by mouth daily. 11/26/17   [name], MD  hydrALAZINE (APRESOLINE) 25 MG tablet Take 1 tablet (25 mg total) by mouth every 8 (eight) hours. Patient taking differently: Take 25 mg by mouth 3 (three) times daily.  08/29/17   Clent Demark, PA-C  hydrOXYzine (VISTARIL) 50 MG capsule Take 50 mg by mouth 3 (three) times daily as needed for anxiety.     [provider]  lipase/protease/amylase (CREON) 12000 units CPEP capsule Take 1 capsule (12,000 Units total) by mouth 3 (three) times daily with meals. For pancreatitis 10/01/17   Aline August, MD  loratadine (CLARITIN) 10 MG tablet Take 1 tablet (10 mg total) by mouth daily as needed for allergies or rhinitis. (May purchase from over the counter): For allergies 10/01/17   Aline August, MD  metoprolol tartrate (LOPRESSOR) 25 MG tablet Take 1 tablet (25 mg total) by mouth 2 (two) times daily. For high blood pressure 08/29/17   Clent Demark, PA-C  Multiple Vitamin  (MULTIVITAMIN WITH MINERALS) TABS tablet Take 1 tablet by mouth daily. 09/06/17   Bonnell Public, MD  ondansetron (ZOFRAN) 4 MG tablet Take 1 tablet (4 mg total) by mouth every 8 (eight) hours as needed for nausea or vomiting. 11/02/17   McDonald, Mia A, PA-C  pantoprazole (PROTONIX) 40 MG tablet Take 1 tablet (40 mg total) by mouth 2 (two) times daily. 11/25/17   [name], MD  sertraline (ZOLOFT) 100 MG tablet Take 1 tablet (100 mg total) by mouth daily. 08/29/17   Clent Demark, PA-C  sucralfate (CARAFATE) 1 g tablet Take 1 tablet (1 g total) by mouth 4 (four) times daily. 11/25/17   [name], MD  thiamine 100 MG tablet Take 1 tablet (100 mg total) by mouth daily. 11/26/17   [name], MD  traMADol (ULTRAM) 50 MG tablet 1-2 every 6 hours as needed for pain 11/26/17  Charlesetta Shanks, MD  omeprazole (PRILOSEC) 20 MG capsule Take 1 capsule (20 mg total) daily by mouth. 03/22/17 06/21/17  Varney Biles, MD    Family History Family History  Problem Relation Age of Onset  . Hypertension Mother   . Cirrhosis Mother   . Alcoholism Mother   . Hypertension Father   . Melanoma Father   . Hypertension Other   . Coronary artery disease Other     Social History Social History   Tobacco Use  . Smoking status: Current Every Day Smoker    Packs/day: 1.00    Years: 33.00    Pack years: 33.00    Types: Cigarettes, E-cigarettes  . Smokeless tobacco: Never Used  Substance Use Topics  . Alcohol use: Yes  . Drug use: Yes    Types: Marijuana, Cocaine    Comment: daily marijuana use; last cocaine use about 3 months ago     Allergies   Robaxin [methocarbamol]; Shellfish-derived products; Trazodone; Trazodone and nefazodone; Contrast media [iodinated diagnostic agents]; and Reglan [metoclopramide]   Review of Systems Review of Systems  Gastrointestinal: Positive for abdominal pain.  All other systems reviewed and are negative.    Physical Exam Updated Vital  Signs BP (!) 140/103   Pulse 97   Temp 98.5 F (36.9 C) (Oral)   Resp 17   Ht 5\' 9"  (1.753 m)   Wt 65.8 kg (145 lb)   SpO2 98%   BMI 21.41 kg/m    Physical Exam  Nursing note and vitals reviewed.  47 year old male, resting comfortably and in no acute distress. Vital signs are significant for elevated blood pressure and elevated heart rate. Oxygen saturation is 98%, which is normal. Head is normocephalic and atraumatic. PERRLA, EOMI. Oropharynx is clear. Neck is nontender and supple without adenopathy or JVD. Back is nontender and there is no CVA tenderness. Lungs are clear without rales, wheezes, or rhonchi. Chest is nontender. Heart has regular rate and rhythm without murmur. Abdomen is soft, flat, with moderate epigastric tenderness.  There is no rebound or guarding.  There are no masses or hepatosplenomegaly and peristalsis is hypoactive. Rectal: Normal sphincter tone, prostate moderately enlarged without nodules.  Stool is light brown and Hemoccult negative. Extremities have no cyanosis or edema, full range of motion is present. Skin is warm and dry without rash. Neurologic: Mental status is normal, cranial nerves are intact, there are no motor or sensory deficits.  ED Treatments / Results  Labs (all labs ordered are listed, but only abnormal results are displayed) Labs Reviewed  COMPREHENSIVE METABOLIC PANEL - Abnormal; Notable for the following components:      Result Value   Sodium 134 (*)    CO2 18 (*)    Glucose, Bld 112 (*)    All other components within normal limits  CBC - Abnormal; Notable for the following components:   WBC 12.6 (*)    Hemoglobin 12.2 (*)    HCT 38.5 (*)    RDW 25.2 (*)    All other components within normal limits  DIFFERENTIAL - Abnormal; Notable for the following components:   Neutro Abs 8.6 (*)    Monocytes Absolute 1.1 (*)    All other components within normal limits  LIPASE, BLOOD  URINALYSIS, ROUTINE W REFLEX MICROSCOPIC  POC  OCCULT BLOOD, ED  POC OCCULT BLOOD, ED  TYPE AND SCREEN    EKG EKG Interpretation  Date/Time:  Sunday December 03 2017 23:43:11 EDT Ventricular Rate:  100 PR Interval:  QRS Duration: 78 QT Interval:  335 QTC Calculation: 432 R Axis:   73 Text Interpretation:  Sinus tachycardia Atrial premature complex Biatrial enlargement Borderline repolarization abnormality When compared with ECG of 11/26/2017, T wave inversion Inferior leads has resolved Confirmed by Delora Fuel ([id number]) on 12/04/2017 12:31:10 AM  Procedures Procedures  Medications Ordered in ED Medications  pantoprazole (PROTONIX) EC tablet 40 mg (has no administration in time range)  morphine 4 MG/ML injection 4 mg (has no administration in time range)  sodium chloride 0.9 % bolus 1,000 mL (1,000 mLs Intravenous New Bag/Given 12/04/17 0106)  famotidine (PEPCID) IVPB 20 mg premix (0 mg Intravenous Stopped 12/04/17 0136)  prochlorperazine (COMPAZINE) injection 10 mg (10 mg Intravenous Given 12/04/17 0106)  gi cocktail (Maalox,Lidocaine,Donnatal) (30 mLs Oral Given 12/04/17 0124)     Initial Impression / Assessment and Plan / ED Course  I have reviewed the triage vital signs and the nursing notes.  Pertinent labs & imaging results that were available during my care of the patient were reviewed by me and considered in my medical decision making (see chart for details).  Abdominal pain with report of rectal bleeding.  However, no blood noted on my rectal exam.  Old records are reviewed confirming recent endoscopy showing 2 duodenal ulcers.  He does have a care plan because of chronic abdominal pain.  We will try to avoid narcotics.  He will be given GI cocktail and famotidine.  He is still complaining of nausea after dose of ondansetron, so is given a dose of prochlorperazine.  Labs show hemoglobin has actually risen significantly since discharge from the hospital.  BUN is not elevated.  Stool is Hemoccult negative.  He had moderate  relief with above-noted treatment.  He does state that he has not been taking his medications since being discharged from the hospital.  He states the pills were left on the medical floor where he was discharged from.  He is discharged with new prescriptions for famotidine, pantoprazole, ondansetron, and sucralfate.  Final Clinical Impressions(s) / ED Diagnoses   Final diagnoses:  Upper abdominal pain  Normochromic normocytic anemia    ED Discharge Orders        Ordered    famotidine (PEPCID) 20 MG tablet  2 times daily     12/04/17 0255    pantoprazole (PROTONIX) 40 MG tablet  2 times daily     12/04/17 0255    sucralfate (CARAFATE) 1 g tablet  4 times daily     12/04/17 0255    ondansetron (ZOFRAN) 4 MG tablet  Every 8 hours PRN     91/47/82 9562      Delora Fuel, MD 13/08/65 0300

## 2017-12-18 ENCOUNTER — Encounter (HOSPITAL_COMMUNITY): Payer: Self-pay | Admitting: *Deleted

## 2017-12-18 ENCOUNTER — Emergency Department (HOSPITAL_COMMUNITY)
Admission: EM | Admit: 2017-12-18 | Discharge: 2017-12-18 | Disposition: A | Discharge status: Home / Self Care | Payer: Self-pay | Attending: Emergency Medicine | Admitting: Emergency Medicine

## 2017-12-18 DIAGNOSIS — I10 Essential (primary) hypertension: Secondary | ICD-10-CM | POA: Insufficient documentation

## 2017-12-18 DIAGNOSIS — Z79899 Other long term (current) drug therapy: Secondary | ICD-10-CM | POA: Insufficient documentation

## 2017-12-18 DIAGNOSIS — D573 Sickle-cell trait: Secondary | ICD-10-CM | POA: Insufficient documentation

## 2017-12-18 DIAGNOSIS — K860 Alcohol-induced chronic pancreatitis: Secondary | ICD-10-CM | POA: Insufficient documentation

## 2017-12-18 DIAGNOSIS — F17210 Nicotine dependence, cigarettes, uncomplicated: Secondary | ICD-10-CM | POA: Insufficient documentation

## 2017-12-18 DIAGNOSIS — J45909 Unspecified asthma, uncomplicated: Secondary | ICD-10-CM | POA: Insufficient documentation

## 2017-12-18 DIAGNOSIS — E7800 Pure hypercholesterolemia, unspecified: Secondary | ICD-10-CM | POA: Insufficient documentation

## 2017-12-18 LAB — COMPREHENSIVE METABOLIC PANEL
ALK PHOS: 104 U/L (ref 38–126)
ALT: 74 U/L — AB (ref 0–44)
AST: 56 U/L — ABNORMAL HIGH (ref 15–41)
Albumin: 4.4 g/dL (ref 3.5–5.0)
Anion gap: 13 (ref 5–15)
BUN: 9 mg/dL (ref 6–20)
CHLORIDE: 102 mmol/L (ref 98–111)
CO2: 24 mmol/L (ref 22–32)
CREATININE: 0.61 mg/dL (ref 0.61–1.24)
Calcium: 9.6 mg/dL (ref 8.9–10.3)
Glucose, Bld: 102 mg/dL — ABNORMAL HIGH (ref 70–99)
Potassium: 3.8 mmol/L (ref 3.5–5.1)
Sodium: 139 mmol/L (ref 135–145)
Total Bilirubin: 0.6 mg/dL (ref 0.3–1.2)
Total Protein: 8.6 g/dL — ABNORMAL HIGH (ref 6.5–8.1)

## 2017-12-18 LAB — CBC WITH DIFFERENTIAL/PLATELET
Basophils Absolute: 0 10*3/uL (ref 0.0–0.1)
Basophils Relative: 0 %
Eosinophils Absolute: 0.3 10*3/uL (ref 0.0–0.7)
Eosinophils Relative: 2 %
HEMATOCRIT: 41.2 % (ref 39.0–52.0)
HEMOGLOBIN: 13.9 g/dL (ref 13.0–17.0)
LYMPHS ABS: 2.5 10*3/uL (ref 0.7–4.0)
LYMPHS PCT: 19 %
MCH: 27.6 pg (ref 26.0–34.0)
MCHC: 33.7 g/dL (ref 30.0–36.0)
MCV: 81.7 fL (ref 78.0–100.0)
MONOS PCT: 7 %
Monocytes Absolute: 1 10*3/uL (ref 0.1–1.0)
NEUTROS ABS: 9.5 10*3/uL — AB (ref 1.7–7.7)
NEUTROS PCT: 72 %
Platelets: 293 10*3/uL (ref 150–400)
RBC: 5.04 MIL/uL (ref 4.22–5.81)
RDW: 21.9 % — ABNORMAL HIGH (ref 11.5–15.5)
WBC: 13.2 10*3/uL — AB (ref 4.0–10.5)

## 2017-12-18 LAB — PROTIME-INR
INR: 1
Prothrombin Time: 13.1 seconds (ref 11.4–15.2)

## 2017-12-18 LAB — POC OCCULT BLOOD, ED: FECAL OCCULT BLD: NEGATIVE

## 2017-12-18 LAB — LIPASE, BLOOD: LIPASE: 33 U/L (ref 11–51)

## 2017-12-18 MED ORDER — GI COCKTAIL ~~LOC~~
30.0000 mL | Freq: Once | ORAL | Status: AC
  Administered 2017-12-18: 30 mL via ORAL
  Filled 2017-12-18: qty 30

## 2017-12-18 MED ORDER — HALOPERIDOL LACTATE 5 MG/ML IJ SOLN
2.5000 mg | Freq: Once | INTRAMUSCULAR | Status: AC
  Administered 2017-12-18: 2.5 mg via INTRAVENOUS
  Filled 2017-12-18: qty 1

## 2017-12-18 MED ORDER — FAMOTIDINE IN NACL 20-0.9 MG/50ML-% IV SOLN
20.0000 mg | Freq: Once | INTRAVENOUS | Status: AC
  Administered 2017-12-18: 20 mg via INTRAVENOUS
  Filled 2017-12-18: qty 50

## 2017-12-18 MED ORDER — OXYCODONE HCL 5 MG PO TABS
5.0000 mg | ORAL_TABLET | Freq: Once | ORAL | Status: AC
  Administered 2017-12-18: 5 mg via ORAL
  Filled 2017-12-18: qty 1

## 2017-12-18 MED ORDER — DIPHENHYDRAMINE HCL 50 MG/ML IJ SOLN
25.0000 mg | Freq: Once | INTRAMUSCULAR | Status: AC
  Administered 2017-12-18: 25 mg via INTRAVENOUS
  Filled 2017-12-18: qty 1

## 2017-12-18 NOTE — ED Provider Notes (Signed)
[facility] DEPT Provider Note   CSN: [id number] Arrival date & time: 12/18/17  1112     History   Chief Complaint Chief Complaint  Patient presents with  . Abdominal Pain    HPI [name] is a 47 y.o. male.  HPI 47 year old male with past medical history as below including chronic abdominal pain here with ongoing abdominal pain.  The patient states that he had wine to drink yesterday.  Since then, he has had acute on chronic aching, gnawing, epigastric pain.  Said nausea and vomiting.  The pain is similar to his chronic pancreatitis pain.  Is requesting IV morphine, as he was given this last time and it helped significantly.  He has not had any overt blood in his stools or emesis, though he does have a history of recent GI bleed.  He states he had some dark, possible coffee-ground like substance in his emesis and stool earlier today, but this is not resolved.  He is not on any blood thinners.  No other medical complaints.  No fevers or chills.  No urinary symptoms.   Past Medical History:  Diagnosis Date  . Alcoholism /alcohol abuse ([place])   . Anemia   . Anxiety   . Arthritis    "knees; arms; elbows" (03/26/2015)  . Asthma   . Bipolar disorder ([place])   . Chronic bronchitis ([place])   . Chronic lower back pain   . Chronic pancreatitis ([place])   . Cocaine abuse ([place])   . Depression   . Family history of adverse reaction to anesthesia    "grandmother gets confused"  . Femoral condyle fracture ([place]) 03/08/2014   left medial/notes 03/09/2014  . GERD (gastroesophageal reflux disease)   . H/O hiatal hernia   . H/O suicide attempt 10/2012  . Heart murmur    "when he was little" (03/06/2013)  . High cholesterol   . History of blood transfusion 10/2012   "when I tried to commit suicide"  . History of stomach ulcers   . Hypertension   . Marijuana abuse, continuous   . Migraine    "a few times/year" (03/26/2015)  . Pneumonia 1990's X 3  .  PTSD (post-traumatic stress disorder)   . Shortness of breath    "can happen at anytime" (03/06/2013)  . Sickle cell trait ([place])   . WPW (Wolff-Parkinson-White syndrome)    [name] 03/06/2013    Patient Active Problem List   Diagnosis Date Noted  . GI bleed 11/24/2017  . Acute blood loss anemia 11/24/2017  . Atypical chest pain 11/24/2017  . Acute pancreatitis 09/28/2017  . Abdominal pain 05/27/2017  . Hematemesis 05/27/2017  . Tachycardia 03/18/2017  . Diarrhea 03/18/2017  . Acute on chronic pancreatitis ([place]) 12/17/2016  . Intractable nausea and vomiting 12/05/2016  . Verbally abusive behavior 12/05/2016  . Normocytic anemia 12/05/2016  . Alcohol use disorder, severe, dependence ([place]) 07/25/2016  . Cocaine use disorder, severe, dependence ([place]) 07/25/2016  . Major depressive disorder, recurrent severe without psychotic features ([place]) 07/20/2016  . Leukocytosis   . Hospital acquired PNA 05/20/2015  . Chronic pancreatitis ([place]) 05/18/2015  . Pseudocyst of pancreas 05/18/2015  . Polysubstance abuse (tobacco, cocaine, THC, and ETOH) 03/26/2015  . Alcohol-induced chronic pancreatitis ([place])   . Benign essential HTN 02/06/2014  . Alcohol-induced acute pancreatitis 11/28/2013  . Pancreatic pseudocyst/cyst 11/25/2013  . Severe protein-calorie malnutrition ([place]) 10/10/2013  . Suicide attempt ([place]) 10/08/2013  . 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  TOBACCO ABUSE 03/23/2007    Past Surgical History:  Procedure Laterality Date  . BIOPSY  11/25/2017   Procedure: BIOPSY;  Surgeon: Arta Silence, MD;  Location: [facility];  Service: Endoscopy;;  . CARDIAC CATHETERIZATION    . ESOPHAGOGASTRODUODENOSCOPY (EGD) WITH PROPOFOL N/A 11/25/2017   Procedure: ESOPHAGOGASTRODUODENOSCOPY (EGD) WITH PROPOFOL;  Surgeon: Arta Silence, MD;  Location: [facility];  Service: Endoscopy;  Laterality: N/A;  . EYE SURGERY Left 1990's   "result of trauma"   . FACIAL  FRACTURE SURGERY Left 1990's   "result of trauma"   . FRACTURE SURGERY    . HERNIA REPAIR    . LEFT HEART CATHETERIZATION WITH CORONARY ANGIOGRAM Right 03/07/2013   Procedure: LEFT HEART CATHETERIZATION WITH CORONARY ANGIOGRAM;  Surgeon: [name], MD;  Location: [facility] CATH LAB;  Service: Cardiovascular;  Laterality: Right;  . UMBILICAL HERNIA REPAIR          Home Medications    Prior to Admission medications   Medication Sig Start Date End Date Taking? Authorizing Provider  acetaminophen (TYLENOL) 500 MG tablet Take 1,000 mg by mouth every 4 (four) hours as needed for moderate pain.   Yes [provider]  famotidine (PEPCID) 20 MG tablet Take 1 tablet (20 mg total) by mouth 2 (two) times daily. 4/40/10  Yes Delora Fuel, MD  folic acid (FOLVITE) 1 MG tablet Take 1 tablet (1 mg total) by mouth daily. 11/26/17  Yes [name], MD  hydrOXYzine (VISTARIL) 50 MG capsule Take 50 mg by mouth 3 (three) times daily as needed for anxiety.    Yes [provider]  lipase/protease/amylase (CREON) 12000 units CPEP capsule Take 1 capsule (12,000 Units total) by mouth 3 (three) times daily with meals. For pancreatitis 10/01/17  Yes Aline August, MD  loratadine (CLARITIN) 10 MG tablet Take 1 tablet (10 mg total) by mouth daily as needed for allergies or rhinitis. (May purchase from over the counter): For allergies 10/01/17  Yes Aline August, MD  metoprolol tartrate (LOPRESSOR) 25 MG tablet Take 1 tablet (25 mg total) by mouth 2 (two) times daily. For high blood pressure 08/29/17  Yes Clent Demark, PA-C  Multiple Vitamin (MULTIVITAMIN WITH MINERALS) TABS tablet Take 1 tablet by mouth daily. 09/06/17  Yes Bonnell Public, MD  ondansetron (ZOFRAN) 4 MG tablet Take 1 tablet (4 mg total) by mouth every 8 (eight) hours as needed for nausea or vomiting. 2/72/53  Yes Delora Fuel, MD  pantoprazole (PROTONIX) 40 MG tablet Take 1 tablet (40 mg total) by mouth 2 (two) times daily.  6/64/40  Yes Delora Fuel, MD  sertraline (ZOLOFT) 100 MG tablet Take 1 tablet (100 mg total) by mouth daily. 08/29/17  Yes Clent Demark, PA-C  sucralfate (CARAFATE) 1 g tablet Take 1 tablet (1 g total) by mouth 4 (four) times daily. 3/47/42  Yes Delora Fuel, MD  thiamine 100 MG tablet Take 1 tablet (100 mg total) by mouth daily. 11/26/17  Yes [name], MD  traMADol Veatrice Bourbon) 50 MG tablet 1-2 every 6 hours as needed for pain 11/26/17  Yes Pfeiffer, Jeannie Done, MD  albuterol (PROVENTIL HFA;VENTOLIN HFA) 108 (90 Base) MCG/ACT inhaler Inhale 2 puffs into the lungs every 6 (six) hours as needed for wheezing or shortness of breath. Patient not taking: Reported on 12/18/2017 08/29/17   Clent Demark, PA-C  hydrALAZINE (APRESOLINE) 25 MG tablet Take 1 tablet (25 mg total) by mouth every 8 (eight) hours. Patient not taking: Reported on 12/18/2017 08/29/17  Clent Demark, PA-C  omeprazole (PRILOSEC) 20 MG capsule Take 1 capsule (20 mg total) daily by mouth. 03/22/17 06/21/17  Varney Biles, MD    Family History Family History  Problem Relation Age of Onset  . Hypertension Mother   . Cirrhosis Mother   . Alcoholism Mother   . Hypertension Father   . Melanoma Father   . Hypertension Other   . Coronary artery disease Other     Social History Social History   Tobacco Use  . Smoking status: Current Every Day Smoker    Packs/day: 1.00    Years: 33.00    Pack years: 33.00    Types: Cigarettes, E-cigarettes  . Smokeless tobacco: Never Used  Substance Use Topics  . Alcohol use: Yes  . Drug use: Yes    Types: Marijuana, Cocaine    Comment: daily marijuana use; last cocaine use about 3 months ago     Allergies   Robaxin [methocarbamol]; Shellfish-derived products; Trazodone; Trazodone and nefazodone; Contrast media [iodinated diagnostic agents]; and Reglan [metoclopramide]   Review of Systems Review of Systems  Constitutional: Positive for fatigue. Negative for chills and  fever.  HENT: Negative for congestion, rhinorrhea and sore throat.   Eyes: Negative for visual disturbance.  Respiratory: Negative for cough, shortness of breath and wheezing.   Cardiovascular: Negative for chest pain and leg swelling.  Gastrointestinal: Positive for abdominal pain, nausea and vomiting. Negative for diarrhea.  Genitourinary: Negative for dysuria and flank pain.  Musculoskeletal: Negative for neck pain and neck stiffness.  Skin: Negative for rash and wound.  Allergic/Immunologic: Negative for immunocompromised state.  Neurological: Negative for syncope and headaches.  All other systems reviewed and are negative.    Physical Exam Updated Vital Signs BP 119/85   Pulse 74   Temp 98.3 F (36.8 C) (Oral)   Resp 18   SpO2 99%   Physical Exam  Constitutional: He is oriented to person, place, and time. He appears well-developed and well-nourished. No distress.  HENT:  Head: Normocephalic and atraumatic.  Mouth/Throat: Oropharynx is clear and moist.  Eyes: Conjunctivae are normal.  Neck: Neck supple.  Cardiovascular: Normal rate, regular rhythm and normal heart sounds. Exam reveals no friction rub.  No murmur heard. Pulmonary/Chest: Effort normal and breath sounds normal. No respiratory distress. He has no wheezes. He has no rales.  Abdominal: He exhibits no distension. There is tenderness in the epigastric area. There is no rigidity, no rebound, no guarding, no tenderness at McBurney's point and negative Murphy's sign.  Musculoskeletal: He exhibits no edema.  Neurological: He is alert and oriented to person, place, and time. He exhibits normal muscle tone.  Skin: Skin is warm. Capillary refill takes less than 2 seconds.  Psychiatric: He has a normal mood and affect.  Nursing note and vitals reviewed.    ED Treatments / Results  Labs (all labs ordered are listed, but only abnormal results are displayed) Labs Reviewed  CBC WITH DIFFERENTIAL/PLATELET - Abnormal;  Notable for the following components:      Result Value   WBC 13.2 (*)    RDW 21.9 (*)    Neutro Abs 9.5 (*)    All other components within normal limits  COMPREHENSIVE METABOLIC PANEL - Abnormal; Notable for the following components:   Glucose, Bld 102 (*)    Total Protein 8.6 (*)    AST 56 (*)    ALT 74 (*)    All other components within normal limits  LIPASE, BLOOD  PROTIME-INR  POC OCCULT BLOOD, ED    EKG None  Radiology No results found.  Procedures Procedures (including critical care time)  Medications Ordered in ED Medications  famotidine (PEPCID) IVPB 20 mg premix (0 mg Intravenous Stopped 12/18/17 1241)  gi cocktail (Maalox,Lidocaine,Donnatal) (30 mLs Oral Given 12/18/17 1159)  haloperidol lactate (HALDOL) injection 2.5 mg (2.5 mg Intravenous Given 12/18/17 1247)  diphenhydrAMINE (BENADRYL) injection 25 mg (25 mg Intravenous Given 12/18/17 1246)  oxyCODONE (Oxy IR/ROXICODONE) immediate release tablet 5 mg (5 mg Oral Given 12/18/17 1343)     Initial Impression / Assessment and Plan / ED Course  I have reviewed the triage vital signs and the nursing notes.  Pertinent labs & imaging results that were available during my care of the patient were reviewed by me and considered in my medical decision making (see chart for details).    47 year old male well-known to this ED with acute on chronic epigastric abdominal pain, nausea, and vomiting.  Suspect acute on chronic pancreatitis secondary to ongoing alcohol use.  Patient is hemodynamically stable. He does have a remote history of GIB but Hgb is stable here, hemoccult negative. Abdomen is otherwise soft, NT, ND, without rebound or guarding. Do not feel repeat imaging is indicated and this is a well-known, well-documented history of same. Do not feel IV opiates are indicated given chronicity of his pain, care plan. D/c home with ongoing antacid, supportive care.  Final Clinical Impressions(s) / ED Diagnoses   Final  diagnoses:  Alcohol-induced chronic pancreatitis [facility])    ED Discharge Orders    None       [name], MD 12/18/17 [phone]

## 2017-12-18 NOTE — ED Triage Notes (Signed)
Per EMS, pt c/o abdominal pain. Pt has hx of pancreatitis, states he drank alcohol yesterday. Pt states he has also had diarrhea and blood in emesis.

## 2017-12-25 ENCOUNTER — Emergency Department
Admission: EM | Admit: 2017-12-25 | Discharge: 2017-12-25 | Disposition: A | Discharge status: Home / Self Care | Payer: Self-pay | Attending: Emergency Medicine | Admitting: Emergency Medicine

## 2017-12-25 DIAGNOSIS — D573 Sickle-cell trait: Secondary | ICD-10-CM | POA: Insufficient documentation

## 2017-12-25 DIAGNOSIS — F17210 Nicotine dependence, cigarettes, uncomplicated: Secondary | ICD-10-CM | POA: Insufficient documentation

## 2017-12-25 DIAGNOSIS — I456 Pre-excitation syndrome: Secondary | ICD-10-CM | POA: Insufficient documentation

## 2017-12-25 DIAGNOSIS — R1013 Epigastric pain: Secondary | ICD-10-CM | POA: Insufficient documentation

## 2017-12-25 DIAGNOSIS — Z79899 Other long term (current) drug therapy: Secondary | ICD-10-CM | POA: Insufficient documentation

## 2017-12-25 DIAGNOSIS — F319 Bipolar disorder, unspecified: Secondary | ICD-10-CM | POA: Insufficient documentation

## 2017-12-25 DIAGNOSIS — F419 Anxiety disorder, unspecified: Secondary | ICD-10-CM | POA: Insufficient documentation

## 2017-12-25 DIAGNOSIS — F1210 Cannabis abuse, uncomplicated: Secondary | ICD-10-CM | POA: Insufficient documentation

## 2017-12-25 DIAGNOSIS — J45909 Unspecified asthma, uncomplicated: Secondary | ICD-10-CM | POA: Insufficient documentation

## 2017-12-25 DIAGNOSIS — F1020 Alcohol dependence, uncomplicated: Secondary | ICD-10-CM | POA: Insufficient documentation

## 2017-12-25 DIAGNOSIS — F1410 Cocaine abuse, uncomplicated: Secondary | ICD-10-CM | POA: Insufficient documentation

## 2017-12-25 DIAGNOSIS — I10 Essential (primary) hypertension: Secondary | ICD-10-CM | POA: Insufficient documentation

## 2017-12-25 LAB — CBC WITH DIFFERENTIAL/PLATELET
BASOS PCT: 0 %
Basophils Absolute: 0 10*3/uL (ref 0–0.1)
EOS ABS: 0.4 10*3/uL (ref 0–0.7)
Eosinophils Relative: 3 %
HCT: 36.4 % — ABNORMAL LOW (ref 40.0–52.0)
HEMOGLOBIN: 12.1 g/dL — AB (ref 13.0–18.0)
Lymphocytes Relative: 24 %
Lymphs Abs: 3.2 10*3/uL (ref 1.0–3.6)
MCH: 28.7 pg (ref 26.0–34.0)
MCHC: 33.3 g/dL (ref 32.0–36.0)
MCV: 86.1 fL (ref 80.0–100.0)
MONOS PCT: 7 %
Monocytes Absolute: 0.9 10*3/uL (ref 0.2–1.0)
NEUTROS PCT: 66 %
Neutro Abs: 8.6 10*3/uL — ABNORMAL HIGH (ref 1.4–6.5)
Platelets: 212 10*3/uL (ref 150–440)
RBC: 4.22 MIL/uL — ABNORMAL LOW (ref 4.40–5.90)
RDW: 24 % — ABNORMAL HIGH (ref 11.5–14.5)
WBC: 13.2 10*3/uL — AB (ref 3.8–10.6)

## 2017-12-25 LAB — COMPREHENSIVE METABOLIC PANEL
ALBUMIN: 3.9 g/dL (ref 3.5–5.0)
ALK PHOS: 74 U/L (ref 38–126)
ALT: 48 U/L — ABNORMAL HIGH (ref 0–44)
ANION GAP: 7 (ref 5–15)
AST: 42 U/L — AB (ref 15–41)
BUN: 8 mg/dL (ref 6–20)
CALCIUM: 8.8 mg/dL — AB (ref 8.9–10.3)
CO2: 22 mmol/L (ref 22–32)
Chloride: 108 mmol/L (ref 98–111)
Creatinine, Ser: 0.57 mg/dL — ABNORMAL LOW (ref 0.61–1.24)
GFR calc Af Amer: >60 mL/min (ref >60)
GFR calc non Af Amer: >60 mL/min (ref >60)
GLUCOSE: 85 mg/dL (ref 70–99)
POTASSIUM: 4 mmol/L (ref 3.5–5.1)
SODIUM: 137 mmol/L (ref 135–145)
Total Bilirubin: 0.6 mg/dL (ref 0.3–1.2)
Total Protein: 7.2 g/dL (ref 6.5–8.1)

## 2017-12-25 LAB — LIPASE, BLOOD: Lipase: 24 U/L (ref 11–51)

## 2017-12-25 MED ORDER — DICYCLOMINE HCL 10 MG PO CAPS
20.0000 mg | ORAL_CAPSULE | Freq: Once | ORAL | Status: DC
  Filled 2017-12-25: qty 2

## 2017-12-25 MED ORDER — GI COCKTAIL ~~LOC~~
30.0000 mL | Freq: Once | ORAL | Status: AC
  Administered 2017-12-25: 30 mL via ORAL
  Filled 2017-12-25: qty 30

## 2017-12-25 MED ORDER — SODIUM CHLORIDE 0.9 % IV BOLUS
1000.0000 mL | Freq: Once | INTRAVENOUS | Status: AC
  Administered 2017-12-25: 1000 mL via INTRAVENOUS

## 2017-12-25 MED ORDER — DICYCLOMINE HCL 10 MG PO CAPS: 10.0000 mg | ORAL_CAPSULE | Freq: Once | ORAL | Status: DC

## 2017-12-25 MED ORDER — HALOPERIDOL LACTATE 5 MG/ML IJ SOLN
5.0000 mg | Freq: Once | INTRAMUSCULAR | Status: AC
  Administered 2017-12-25: 5 mg via INTRAVENOUS
  Filled 2017-12-25: qty 1

## 2017-12-25 NOTE — Discharge Instructions (Addendum)
Please seek medical attention for any high fevers, chest pain, shortness of breath, change in behavior, persistent vomiting, bloody stool or any other new or concerning symptoms.  

## 2017-12-25 NOTE — ED Triage Notes (Signed)
Pt BIB [facility] from home for abdominal pain & chest pain starting about 1.5 hours ago. Pt endorses N/V/D - non bloody. Hx of pancreatitis and ulcers. 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 En route EMS gave 4mg  zofran & 136mcg of fentanyl. Pain went from 10/10 in chest/abdomen to 7/10 in abdomen and 5/10 in chest. Upon arrival pt alert & oriented x4. ABCs intact. VSS. NAD.

## 2017-12-25 NOTE — ED Notes (Signed)
MD at bedside. 

## 2017-12-25 NOTE — ED Provider Notes (Signed)
[facility] Emergency Department Provider Note   ____________________________________________   I have reviewed the triage vital signs and the nursing notes.   HISTORY  Chief Complaint Epigastric pain History limited by: Not Limited   HPI [name] is a 47 y.o. male who presents to the emergency department today with complaints for epigastric pain.  Patient states that he has a history of chronic pancreatitis.  He denies any recent alcohol use.  States that the pain started severe today.  Located in the epigastric region.  It is sharp.  He states that the fentanyl he was given by EMS did help his symptoms somewhat.  States he also has history of gastritis.  He denies any recent fevers.   Per medical record review patient has a history of chronic pancreatitis and abdominal pain.  Past Medical History:  Diagnosis Date  . Alcoholism /alcohol abuse ([place])   . Anemia   . Anxiety   . Arthritis    "knees; arms; elbows" (03/26/2015)  . Asthma   . Bipolar disorder ([place])   . Chronic bronchitis ([place])   . Chronic lower back pain   . Chronic pancreatitis ([place])   . Cocaine abuse ([place])   . Depression   . Family history of adverse reaction to anesthesia    "grandmother gets confused"  . Femoral condyle fracture ([place]) 03/08/2014   left medial/notes 03/09/2014  . GERD (gastroesophageal reflux disease)   . H/O hiatal hernia   . H/O suicide attempt 10/2012  . Heart murmur    "when he was little" (03/06/2013)  . High cholesterol   . History of blood transfusion 10/2012   "when I tried to commit suicide"  . History of stomach ulcers   . Hypertension   . Marijuana abuse, continuous   . Migraine    "a few times/year" (03/26/2015)  . Pneumonia 1990's X 3  . PTSD (post-traumatic stress disorder)   . Shortness of breath    "can happen at anytime" (03/06/2013)  . Sickle cell trait ([place])   . WPW (Wolff-Parkinson-White syndrome)    [name] 03/06/2013     Patient Active Problem List   Diagnosis Date Noted  . GI bleed 11/24/2017  . Acute blood loss anemia 11/24/2017  . Atypical chest pain 11/24/2017  . Acute pancreatitis 09/28/2017  . Abdominal pain 05/27/2017  . Hematemesis 05/27/2017  . Tachycardia 03/18/2017  . Diarrhea 03/18/2017  . Acute on chronic pancreatitis ([place]) 12/17/2016  . Intractable nausea and vomiting 12/05/2016  . Verbally abusive behavior 12/05/2016  . Normocytic anemia 12/05/2016  . Alcohol use disorder, severe, dependence ([place]) 07/25/2016  . Cocaine use disorder, severe, dependence ([place]) 07/25/2016  . 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 Leukocytosis   . Hospital acquired PNA 05/20/2015  . Chronic pancreatitis ([place]) 05/18/2015  . Pseudocyst of pancreas 05/18/2015  . Polysubstance abuse (tobacco, cocaine, THC, and ETOH) 03/26/2015  . Alcohol-induced chronic pancreatitis ([place])   . Benign essential HTN 02/06/2014  . Alcohol-induced acute pancreatitis 11/28/2013  . Pancreatic pseudocyst/cyst 11/25/2013  . Severe protein-calorie malnutrition ([place]) 10/10/2013  . Suicide attempt ([place]) 10/08/2013  . 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 TOBACCO ABUSE 03/23/2007    Past Surgical History:  Procedure Laterality Date  . BIOPSY  11/25/2017   Procedure: BIOPSY;  Surgeon: Arta Silence, MD;  Location: [facility];  Service: Endoscopy;;  . CARDIAC CATHETERIZATION    . ESOPHAGOGASTRODUODENOSCOPY (EGD) WITH PROPOFOL N/A 11/25/2017  Procedure: ESOPHAGOGASTRODUODENOSCOPY (EGD) WITH PROPOFOL;  Surgeon: Arta Silence, MD;  Location: [facility] ENDOSCOPY;  Service: Endoscopy;  Laterality: N/A;  . EYE SURGERY Left 1990's   "result of trauma"   . FACIAL FRACTURE SURGERY Left 1990's   "result of trauma"   . FRACTURE SURGERY    . HERNIA REPAIR    . LEFT HEART CATHETERIZATION WITH CORONARY ANGIOGRAM Right 03/07/2013   Procedure: LEFT HEART  CATHETERIZATION WITH CORONARY ANGIOGRAM;  Surgeon: [name], MD;  Location: [facility] CATH LAB;  Service: Cardiovascular;  Laterality: Right;  . UMBILICAL HERNIA REPAIR      Prior to Admission medications   Medication Sig Start Date End Date Taking? Authorizing Provider  acetaminophen (TYLENOL) 500 MG tablet Take 1,000 mg by mouth every 4 (four) hours as needed for moderate pain.    [provider]  albuterol (PROVENTIL HFA;VENTOLIN HFA) 108 (90 Base) MCG/ACT inhaler Inhale 2 puffs into the lungs every 6 (six) hours as needed for wheezing or shortness of breath. Patient not taking: Reported on 12/18/2017 08/29/17   Clent Demark, PA-C  famotidine (PEPCID) 20 MG tablet Take 1 tablet (20 mg total) by mouth 2 (two) times daily. 11/18/43   Delora Fuel, MD  folic acid (FOLVITE) 1 MG tablet Take 1 tablet (1 mg total) by mouth daily. 11/26/17   [name], MD  hydrALAZINE (APRESOLINE) 25 MG tablet Take 1 tablet (25 mg total) by mouth every 8 (eight) hours. Patient not taking: Reported on 12/18/2017 08/29/17   Clent Demark, PA-C  hydrOXYzine (VISTARIL) 50 MG capsule Take 50 mg by mouth 3 (three) times daily as needed for anxiety.     [provider]  lipase/protease/amylase (CREON) 12000 units CPEP capsule Take 1 capsule (12,000 Units total) by mouth 3 (three) times daily with meals. For pancreatitis 10/01/17   Aline August, MD  loratadine (CLARITIN) 10 MG tablet Take 1 tablet (10 mg total) by mouth daily as needed for allergies or rhinitis. (May purchase from over the counter): For allergies 10/01/17   Aline August, MD  metoprolol tartrate (LOPRESSOR) 25 MG tablet Take 1 tablet (25 mg total) by mouth 2 (two) times daily. For high blood pressure 08/29/17   Clent Demark, PA-C  Multiple Vitamin (MULTIVITAMIN WITH MINERALS) TABS tablet Take 1 tablet by mouth daily. 09/06/17   Bonnell Public, MD  ondansetron (ZOFRAN) 4 MG tablet Take 1 tablet (4 mg total) by mouth every  8 (eight) hours as needed for nausea or vomiting. 12/15/96   Delora Fuel, MD  pantoprazole (PROTONIX) 40 MG tablet Take 1 tablet (40 mg total) by mouth 2 (two) times daily. 3/38/25   Delora Fuel, MD  sertraline (ZOLOFT) 100 MG tablet Take 1 tablet (100 mg total) by mouth daily. 08/29/17   Clent Demark, PA-C  sucralfate (CARAFATE) 1 g tablet Take 1 tablet (1 g total) by mouth 4 (four) times daily. 0/53/97   Delora Fuel, MD  thiamine 100 MG tablet Take 1 tablet (100 mg total) by mouth daily. 11/26/17   [name], MD  traMADol Veatrice Bourbon) 50 MG tablet 1-2 every 6 hours as needed for pain 11/26/17   Charlesetta Shanks, MD  omeprazole (PRILOSEC) 20 MG capsule Take 1 capsule (20 mg total) daily by mouth. 03/22/17 06/21/17  Varney Biles, MD    Allergies Robaxin [methocarbamol]; Shellfish-derived products; Trazodone; Trazodone and nefazodone; Contrast media [iodinated diagnostic agents]; and Reglan [metoclopramide]  Family History  Problem Relation Age of Onset  . Hypertension  Mother   . Cirrhosis Mother   . Alcoholism Mother   . Hypertension Father   . Melanoma Father   . Hypertension Other   . Coronary artery disease Other     Social History Social History   Tobacco Use  . Smoking status: Current Every Day Smoker    Packs/day: 1.00    Years: 33.00    Pack years: 33.00    Types: Cigarettes, E-cigarettes  . Smokeless tobacco: Never Used  Substance Use Topics  . Alcohol use: Yes  . Drug use: Yes    Types: Marijuana, Cocaine    Comment: daily marijuana use; last cocaine use about 3 months ago    Review of Systems Constitutional: No fever/chills Eyes: No visual changes. ENT: No sore throat. Cardiovascular: Denies chest pain. Respiratory: Denies shortness of breath. Gastrointestinal: Positive for epigastric pain. Genitourinary: Negative for dysuria. Musculoskeletal: Negative for back pain. Skin: Negative for rash. Neurological: Negative for headaches, focal weakness or  numbness.  ____________________________________________   PHYSICAL EXAM:  VITAL SIGNS: ED Triage Vitals  Enc Vitals Group     BP 12/25/17 2138 (!) 148/97     Pulse Rate 12/25/17 2138 (!) 104     Resp 12/25/17 2138 15     Temp 12/25/17 2138 98.3 F (36.8 C)     Temp Source 12/25/17 2138 Oral     SpO2 12/25/17 2138 100 %     Weight 12/25/17 2140 132 lb (59.9 kg)     Height 12/25/17 2140 5' 10.87" (1.8 m)     Head Circumference --      Peak Flow --      Pain Score 12/25/17 2139 7   Constitutional: Alert and oriented.  Eyes: Conjunctivae are normal.  ENT      Head: Normocephalic and atraumatic.      Nose: No congestion/rhinnorhea.      Mouth/Throat: Mucous membranes are moist.      Neck: No stridor. Hematological/Lymphatic/Immunilogical: No cervical lymphadenopathy. Cardiovascular: Normal rate, regular rhythm.  No murmurs, rubs, or gallops.  Respiratory: Normal respiratory effort without tachypnea nor retractions. Breath sounds are clear and equal bilaterally. No wheezes/rales/rhonchi. Gastrointestinal: Soft and non tender. No rebound. No guarding.  Genitourinary: Deferred Musculoskeletal: Normal range of motion in all extremities. No lower extremity edema. Neurologic:  Normal speech and language. No gross focal neurologic deficits are appreciated.  Skin:  Skin is warm, dry and intact. No rash noted. Psychiatric: Mood and affect are normal. Speech and behavior are normal. Patient exhibits appropriate insight and judgment.  ____________________________________________    LABS (pertinent positives/negatives)  Lipase 24 CMP na 137, k 4.0, cr 0.57, ca 8.8 CBC wbc 13.2, hgb 12.1, plt 212  ____________________________________________   EKG  I, Nance Pear, attending physician, personally viewed and interpreted this EKG  EKG Time: 2135 Rate: 103 Rhythm: sinus tachycardia Axis: normal Intervals: qtc 360 QRS: narrow ST changes: no st elevation Impression:  abnormal ekg   ____________________________________________    RADIOLOGY  None  ____________________________________________   PROCEDURES  Procedures  ____________________________________________   INITIAL IMPRESSION / ASSESSMENT AND PLAN / ED COURSE  Pertinent labs & imaging results that were available during my care of the patient were reviewed by me and considered in my medical decision making (see chart for details).   Patient presented to the emergency department today because of concerns for epigastric pain.  Patient has a history of chronic pancreatitis.  Lipase within normal limits today.  At this point I think gastritis likely.  Patient did feel some improvement with GI cocktail.  Patient states that he is working on follow-up with primary care doctor tomorrow.  ____________________________________________   FINAL CLINICAL IMPRESSION(S) / ED DIAGNOSES  Final diagnoses:  Epigastric pain     Note: This dictation was prepared with Dragon dictation. Any transcriptional errors that result from this process are unintentional     Nance Pear, MD 12/25/17 2334

## 2018-01-01 ENCOUNTER — Emergency Department (HOSPITAL_COMMUNITY)
Admission: EM | Admit: 2018-01-01 | Discharge: 2018-01-01 | Disposition: A | Discharge status: Home / Self Care | Payer: Self-pay | Attending: Emergency Medicine | Admitting: Emergency Medicine

## 2018-01-01 ENCOUNTER — Encounter (HOSPITAL_COMMUNITY): Payer: Self-pay | Admitting: Emergency Medicine

## 2018-01-01 DIAGNOSIS — Z79899 Other long term (current) drug therapy: Secondary | ICD-10-CM | POA: Insufficient documentation

## 2018-01-01 DIAGNOSIS — F17210 Nicotine dependence, cigarettes, uncomplicated: Secondary | ICD-10-CM | POA: Insufficient documentation

## 2018-01-01 DIAGNOSIS — I10 Essential (primary) hypertension: Secondary | ICD-10-CM | POA: Insufficient documentation

## 2018-01-01 DIAGNOSIS — R1013 Epigastric pain: Secondary | ICD-10-CM | POA: Insufficient documentation

## 2018-01-01 LAB — COMPREHENSIVE METABOLIC PANEL
ALK PHOS: 99 U/L (ref 38–126)
ALT: 47 U/L — AB (ref 0–44)
AST: 41 U/L (ref 15–41)
Albumin: 4.7 g/dL (ref 3.5–5.0)
Anion gap: 10 (ref 5–15)
BUN: 6 mg/dL (ref 6–20)
CALCIUM: 9.7 mg/dL (ref 8.9–10.3)
CHLORIDE: 103 mmol/L (ref 98–111)
CO2: 21 mmol/L — ABNORMAL LOW (ref 22–32)
CREATININE: 0.71 mg/dL (ref 0.61–1.24)
GFR calc Af Amer: >60 mL/min (ref >60)
GFR calc non Af Amer: >60 mL/min (ref >60)
GLUCOSE: 117 mg/dL — AB (ref 70–99)
Potassium: 4.7 mmol/L (ref 3.5–5.1)
SODIUM: 134 mmol/L — AB (ref 135–145)
Total Bilirubin: 0.8 mg/dL (ref 0.3–1.2)
Total Protein: 8.4 g/dL — ABNORMAL HIGH (ref 6.5–8.1)

## 2018-01-01 LAB — CBC WITH DIFFERENTIAL/PLATELET
BASOS PCT: 0 %
Basophils Absolute: 0 10*3/uL (ref 0.0–0.1)
EOS ABS: 0.2 10*3/uL (ref 0.0–0.7)
Eosinophils Relative: 2 %
HCT: 46.6 % (ref 39.0–52.0)
Hemoglobin: 14.7 g/dL (ref 13.0–17.0)
Lymphocytes Relative: 23 %
Lymphs Abs: 2 10*3/uL (ref 0.7–4.0)
MCH: 27.6 pg (ref 26.0–34.0)
MCHC: 31.5 g/dL (ref 30.0–36.0)
MCV: 87.6 fL (ref 78.0–100.0)
MONO ABS: 0.6 10*3/uL (ref 0.1–1.0)
Monocytes Relative: 7 %
NEUTROS ABS: 5.7 10*3/uL (ref 1.7–7.7)
Neutrophils Relative %: 68 %
PLATELETS: 261 10*3/uL (ref 150–400)
RBC: 5.32 MIL/uL (ref 4.22–5.81)
RDW: 21.9 % — ABNORMAL HIGH (ref 11.5–15.5)
WBC: 8.5 10*3/uL (ref 4.0–10.5)

## 2018-01-01 LAB — URINALYSIS, ROUTINE W REFLEX MICROSCOPIC
BILIRUBIN URINE: NEGATIVE
Glucose, UA: NEGATIVE mg/dL
HGB URINE DIPSTICK: NEGATIVE
Ketones, ur: NEGATIVE mg/dL
Leukocytes, UA: NEGATIVE
Nitrite: NEGATIVE
Protein, ur: NEGATIVE mg/dL
Specific Gravity, Urine: 1.032 — ABNORMAL HIGH (ref 1.005–1.030)
pH: 5 (ref 5.0–8.0)

## 2018-01-01 LAB — ETHANOL: Alcohol, Ethyl (B): <10 mg/dL (ref <10)

## 2018-01-01 LAB — LIPASE, BLOOD: Lipase: 25 U/L (ref 11–51)

## 2018-01-01 MED ORDER — GI COCKTAIL ~~LOC~~: 30.0000 mL | Freq: Once | ORAL | Status: DC

## 2018-01-01 MED ORDER — HALOPERIDOL LACTATE 5 MG/ML IJ SOLN
5.0000 mg | Freq: Once | INTRAMUSCULAR | Status: AC
  Administered 2018-01-01: 5 mg via INTRAVENOUS
  Filled 2018-01-01: qty 1

## 2018-01-01 MED ORDER — GI COCKTAIL ~~LOC~~
30.0000 mL | Freq: Once | ORAL | Status: AC
  Administered 2018-01-01: 30 mL via ORAL
  Filled 2018-01-01: qty 30

## 2018-01-01 MED ORDER — FAMOTIDINE IN NACL 20-0.9 MG/50ML-% IV SOLN
20.0000 mg | Freq: Once | INTRAVENOUS | Status: AC
  Administered 2018-01-01: 20 mg via INTRAVENOUS
  Filled 2018-01-01: qty 50

## 2018-01-01 MED ORDER — KETAMINE HCL 50 MG/5ML IJ SOSY
0.1500 mg/kg | PREFILLED_SYRINGE | Freq: Once | INTRAMUSCULAR | Status: AC
  Administered 2018-01-01: 9 mg via INTRAVENOUS
  Filled 2018-01-01: qty 5

## 2018-01-01 MED ORDER — SODIUM CHLORIDE 0.9 % IV BOLUS
1000.0000 mL | Freq: Once | INTRAVENOUS | Status: AC
  Administered 2018-01-01: 1000 mL via INTRAVENOUS

## 2018-01-01 MED ORDER — ONDANSETRON 4 MG PO TBDP
4.0000 mg | ORAL_TABLET | Freq: Once | ORAL | Status: DC
  Filled 2018-01-01: qty 1

## 2018-01-01 NOTE — ED Provider Notes (Signed)
Patient placed in Quick Look pathway, seen and evaluated   Chief Complaint: Abdominal pain   HPI:   47 y.o. M with PMH/o Pancreatitis, Abdominal Ulcers who presents for evaluation of abdominal pain that began this morning. He reports taking oxycodone, tylenol and carafate with no improvements in symptoms. Reports he was vomiting "dark stuff" today. Also reports dark loose stools that have been ongoing since yesterday. No Fevers.   ROS: Abdominal Pain (one)  Physical Exam:   Gen: No distress  Neuro: Awake and Alert  Skin: Warm    Focused Exam: TTP noted to the epigastric and LUQ. No rigidity, no guarding.    Initiation of care has begun. The patient has been counseled on the process, plan, and necessity for staying for the completion/evaluation, and the remainder of the medical screening examination    Volanda Napoleon, PA-C 01/01/18 1348    Tegeler, Gwenyth Allegra, MD 01/01/18 [phone]

## 2018-01-01 NOTE — ED Triage Notes (Addendum)
Per [facility]: [place] c/o recurrent RUQ and LUQ abdominal pain x 5 hours, describes as dull and sharp, constant. Hx of ETOH-induced pancreatitis and bleeding stomach ulcers - reports this feels the same as his ulcers in the past. He also endorses non-radiating L-sided CP and nausea. 20g. PIV LAC, given 4mg  Zofran PTA. EMS VS: HR 103 ST, 140/100, RR 18, 100% RA, CBG 148.

## 2018-01-01 NOTE — ED Provider Notes (Signed)
[facility] EMERGENCY DEPARTMENT Provider Note   CSN: [id number] Arrival date & time: 01/01/18  1332     History   Chief Complaint Chief Complaint  Patient presents with  . Abdominal Pain    HPI Lautaro Khiem Gargis is a 47 y.o. male.   Patient is a 47 year old male who has a history of chronic abdominal pain related to chronic pancreatitis, peptic ulcer disease and ongoing alcohol use.  He has frequent visits for upper abdominal pain.  He states his pain started back again this morning.  He has had some vomiting which he says is dark-colored.  He also has watery stools that started today.  He denies any known fevers.  His abdominal pain is similar to his prior episodes of pain.  He states he has an appointment with gastroenterology in 2 days.  He has been taking Carafate, Protonix and oxycodone at home without improvement in symptoms.     Past Medical History:  Diagnosis Date  . Alcoholism /alcohol abuse ([place])   . Anemia   . Anxiety   . Arthritis    "knees; arms; elbows" (03/26/2015)  . Asthma   . Bipolar disorder ([place])   . Chronic bronchitis ([place])   . Chronic lower back pain   . Chronic pancreatitis ([place])   . Cocaine abuse ([place])   . Depression   . Family history of adverse reaction to anesthesia    "grandmother gets confused"  . Femoral condyle fracture ([place]) 03/08/2014   left medial/notes 03/09/2014  . GERD (gastroesophageal reflux disease)   . H/O hiatal hernia   . H/O suicide attempt 10/2012  . Heart murmur    "when he was little" (03/06/2013)  . High cholesterol   . History of blood transfusion 10/2012   "when I tried to commit suicide"  . History of stomach ulcers   . Hypertension   . Marijuana abuse, continuous   . Migraine    "a few times/year" (03/26/2015)  . Pneumonia 1990's X 3  . PTSD (post-traumatic stress disorder)   . Shortness of breath    "can happen at anytime" (03/06/2013)  . Sickle cell trait ([place])   . WPW  (Wolff-Parkinson-White syndrome)    [name] 03/06/2013    Patient Active Problem List   Diagnosis Date Noted  . GI bleed 11/24/2017  . Acute blood loss anemia 11/24/2017  . Atypical chest pain 11/24/2017  . Acute pancreatitis 09/28/2017  . Abdominal pain 05/27/2017  . Hematemesis 05/27/2017  . Tachycardia 03/18/2017  . Diarrhea 03/18/2017  . Acute on chronic pancreatitis ([place]) 12/17/2016  . Intractable nausea and vomiting 12/05/2016  . Verbally abusive behavior 12/05/2016  . Normocytic anemia 12/05/2016  . Alcohol use disorder, severe, dependence ([place]) 07/25/2016  . Cocaine use disorder, severe, dependence ([place]) 07/25/2016  . Major depressive disorder, recurrent severe without psychotic features ([place]) 07/20/2016  . Leukocytosis   . Hospital acquired PNA 05/20/2015  . Chronic pancreatitis ([place]) 05/18/2015  . Pseudocyst of pancreas 05/18/2015  . Polysubstance abuse (tobacco, cocaine, THC, and ETOH) 03/26/2015  . Alcohol-induced chronic pancreatitis ([place])   . Benign essential HTN 02/06/2014  . Alcohol-induced acute pancreatitis 11/28/2013  . Pancreatic pseudocyst/cyst 11/25/2013  . Severe protein-calorie malnutrition ([place]) 10/10/2013  . Suicide attempt ([place]) 10/08/2013  . 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 TOBACCO ABUSE 03/23/2007    Past Surgical History:  Procedure Laterality Date  . BIOPSY  11/25/2017   Procedure: BIOPSY;  Surgeon: Arta Silence, MD;  Location: [facility];  Service: Endoscopy;;  . CARDIAC CATHETERIZATION    . ESOPHAGOGASTRODUODENOSCOPY (EGD) WITH PROPOFOL N/A 11/25/2017   Procedure: ESOPHAGOGASTRODUODENOSCOPY (EGD) WITH PROPOFOL;  Surgeon: Arta Silence, MD;  Location: [facility];  Service: Endoscopy;  Laterality: N/A;  . EYE SURGERY Left 1990's   "result of trauma"   . FACIAL FRACTURE SURGERY Left 1990's   "result of trauma"   . FRACTURE SURGERY    . HERNIA REPAIR    . LEFT HEART CATHETERIZATION WITH CORONARY  ANGIOGRAM Right 03/07/2013   Procedure: LEFT HEART CATHETERIZATION WITH CORONARY ANGIOGRAM;  Surgeon: [name], MD;  Location: [facility] CATH [facility];  Service: Cardiovascular;  Laterality: Right;  . UMBILICAL HERNIA REPAIR          Home Medications    Prior to Admission medications   Medication Sig Start Date End Date Taking? Authorizing Provider  acetaminophen (TYLENOL) 500 MG tablet Take 1,000 mg by mouth every 4 (four) hours as needed for moderate pain.    [provider]  albuterol (PROVENTIL HFA;VENTOLIN HFA) 108 (90 Base) MCG/ACT inhaler Inhale 2 puffs into the lungs every 6 (six) hours as needed for wheezing or shortness of breath. Patient not taking: Reported on 12/18/2017 08/29/17   Clent Demark, PA-C  famotidine (PEPCID) 20 MG tablet Take 1 tablet (20 mg total) by mouth 2 (two) times daily. 08/14/12   Delora Fuel, MD  folic acid (FOLVITE) 1 MG tablet Take 1 tablet (1 mg total) by mouth daily. 11/26/17   [name], MD  hydrALAZINE (APRESOLINE) 25 MG tablet Take 1 tablet (25 mg total) by mouth every 8 (eight) hours. Patient not taking: Reported on 12/18/2017 08/29/17   Clent Demark, PA-C  hydrOXYzine (VISTARIL) 50 MG capsule Take 50 mg by mouth 3 (three) times daily as needed for anxiety.     [provider]  lipase/protease/amylase (CREON) 12000 units CPEP capsule Take 1 capsule (12,000 Units total) by mouth 3 (three) times daily with meals. For pancreatitis 10/01/17   Aline August, MD  loratadine (CLARITIN) 10 MG tablet Take 1 tablet (10 mg total) by mouth daily as needed for allergies or rhinitis. (May purchase from over the counter): For allergies 10/01/17   Aline August, MD  metoprolol tartrate (LOPRESSOR) 25 MG tablet Take 1 tablet (25 mg total) by mouth 2 (two) times daily. For high blood pressure 08/29/17   Clent Demark, PA-C  Multiple Vitamin (MULTIVITAMIN WITH MINERALS) TABS tablet Take 1 tablet by mouth daily. 09/06/17   [place] Public,  MD  ondansetron (ZOFRAN) 4 MG tablet Take 1 tablet (4 mg total) by mouth every 8 (eight) hours as needed for nausea or vomiting. 4/81/85   Delora Fuel, MD  pantoprazole (PROTONIX) 40 MG tablet Take 1 tablet (40 mg total) by mouth 2 (two) times daily. 6/31/49   Delora Fuel, MD  sertraline (ZOLOFT) 100 MG tablet Take 1 tablet (100 mg total) by mouth daily. 08/29/17   Clent Demark, PA-C  sucralfate (CARAFATE) 1 g tablet Take 1 tablet (1 g total) by mouth 4 (four) times daily. 11/07/61   Delora Fuel, MD  thiamine 100 MG tablet Take 1 tablet (100 mg total) by mouth daily. 11/26/17   [name], MD  traMADol Veatrice Bourbon) 50 MG tablet 1-2 every 6 hours as needed for pain 11/26/17   Charlesetta Shanks, MD  omeprazole (PRILOSEC) 20 MG capsule Take 1 capsule (20 mg total) daily by mouth. 03/22/17 06/21/17  Varney Biles, MD  Family History Family History  Problem Relation Age of Onset  . Hypertension Mother   . Cirrhosis Mother   . Alcoholism Mother   . Hypertension Father   . Melanoma Father   . Hypertension Other   . Coronary artery disease Other     Social History Social History   Tobacco Use  . Smoking status: Current Every Day Smoker    Packs/day: 1.00    Years: 33.00    Pack years: 33.00    Types: Cigarettes, E-cigarettes  . Smokeless tobacco: Never Used  Substance Use Topics  . Alcohol use: Yes  . Drug use: Yes    Types: Marijuana, Cocaine    Comment: daily marijuana use; last cocaine use about 3 months ago     Allergies   Robaxin [methocarbamol]; Shellfish-derived products; Trazodone; Trazodone and nefazodone; Contrast media [iodinated diagnostic agents]; and Reglan [metoclopramide]   Review of Systems Review of Systems  Constitutional: Negative for chills, diaphoresis, fatigue and fever.  HENT: Negative for congestion, rhinorrhea and sneezing.   Eyes: Negative.   Respiratory: Negative for cough, chest tightness and shortness of breath.   Cardiovascular:  Negative for chest pain and leg swelling.  Gastrointestinal: Positive for abdominal pain, diarrhea, nausea and vomiting. Negative for blood in stool.  Genitourinary: Negative for difficulty urinating, flank pain, frequency and hematuria.  Musculoskeletal: Negative for arthralgias and back pain.  Skin: Negative for rash.  Neurological: Negative for dizziness, speech difficulty, weakness, numbness and headaches.     Physical Exam Updated Vital Signs BP 119/88   Pulse 93   Temp 98.3 F (36.8 C) (Oral)   Resp 16   SpO2 100%   Physical Exam  Constitutional: He is oriented to person, place, and time. He appears well-developed and well-nourished.  HENT:  Head: Normocephalic and atraumatic.  Eyes: Pupils are equal, round, and reactive to light.  Neck: Normal range of motion. Neck supple.  Cardiovascular: Normal rate, regular rhythm and normal heart sounds.  Pulmonary/Chest: Effort normal and breath sounds normal. No respiratory distress. He has no wheezes. He has no rales. He exhibits no tenderness.  Abdominal: Soft. Bowel sounds are normal. There is tenderness in the epigastric area. There is no rebound and no guarding.  Musculoskeletal: Normal range of motion. He exhibits no edema.  Lymphadenopathy:    He has no cervical adenopathy.  Neurological: He is alert and oriented to person, place, and time.  Skin: Skin is warm and dry. No rash noted.  Psychiatric: He has a normal mood and affect.     ED Treatments / Results  Labs (all labs ordered are listed, but only abnormal results are displayed) Labs Reviewed  COMPREHENSIVE METABOLIC PANEL - Abnormal; Notable for the following components:      Result Value   Sodium 134 (*)    CO2 21 (*)    Glucose, Bld 117 (*)    Total Protein 8.4 (*)    ALT 47 (*)    All other components within normal limits  CBC WITH DIFFERENTIAL/PLATELET - Abnormal; Notable for the following components:   RDW 21.9 (*)    All other components within normal  limits  URINALYSIS, ROUTINE W REFLEX MICROSCOPIC - Abnormal; Notable for the following components:   Specific Gravity, Urine 1.032 (*)    All other components within normal limits  ETHANOL  LIPASE, BLOOD    EKG None  Radiology No results found.  Procedures Procedures (including critical care time)  Medications Ordered in ED Medications  ondansetron (ZOFRAN-ODT)  disintegrating tablet 4 mg (0 mg Oral Hold 01/01/18 1415)  gi cocktail (Maalox,Lidocaine,Donnatal) (has no administration in time range)  haloperidol lactate (HALDOL) injection 5 mg (5 mg Intravenous Given 01/01/18 1626)  famotidine (PEPCID) IVPB 20 mg premix (0 mg Intravenous Stopped 01/01/18 1705)  sodium chloride 0.9 % bolus 1,000 mL (1,000 mLs Intravenous New Bag/Given 01/01/18 1622)  gi cocktail (Maalox,Lidocaine,Donnatal) (30 mLs Oral Given 01/01/18 1624)  ketamine 50 mg in normal saline 5 mL (10 mg/mL) syringe (9 mg Intravenous Given 01/01/18 1743)     Initial Impression / Assessment and Plan / ED Course  I have reviewed the triage vital signs and the nursing notes.  Pertinent labs & imaging results that were available during my care of the patient were reviewed by me and considered in my medical decision making (see chart for details).     Patient is a 47 year old male who presents with upper abdominal pain.  This is chronic for him.  He has an appointment to follow-up with GI in 2 days.  His hemoglobin is normal.  His other labs are non-concerning.  His LFTs are similar to prior values.  He was treated symptomatically in the ED.  Return precautions were given.  Final Clinical Impressions(s) / ED Diagnoses   Final diagnoses:  Epigastric pain    ED Discharge Orders    None       Malvin Johns, MD 01/01/18 [phone]

## 2018-01-08 ENCOUNTER — Encounter (HOSPITAL_COMMUNITY): Payer: Self-pay

## 2018-01-08 ENCOUNTER — Observation Stay (HOSPITAL_COMMUNITY)
Admission: EM | Admit: 2018-01-08 | Discharge: 2018-01-09 | Disposition: A | Discharge status: Home / Self Care | Payer: Self-pay | Attending: Internal Medicine | Admitting: Internal Medicine

## 2018-01-08 ENCOUNTER — Other Ambulatory Visit: Payer: Self-pay

## 2018-01-08 ENCOUNTER — Emergency Department (HOSPITAL_COMMUNITY): Payer: Self-pay

## 2018-01-08 DIAGNOSIS — F17210 Nicotine dependence, cigarettes, uncomplicated: Secondary | ICD-10-CM | POA: Insufficient documentation

## 2018-01-08 DIAGNOSIS — R079 Chest pain, unspecified: Secondary | ICD-10-CM | POA: Diagnosis present

## 2018-01-08 DIAGNOSIS — J45909 Unspecified asthma, uncomplicated: Secondary | ICD-10-CM | POA: Insufficient documentation

## 2018-01-08 DIAGNOSIS — I10 Essential (primary) hypertension: Secondary | ICD-10-CM | POA: Insufficient documentation

## 2018-01-08 DIAGNOSIS — K2990 Gastroduodenitis, unspecified, without bleeding: Secondary | ICD-10-CM

## 2018-01-08 DIAGNOSIS — R112 Nausea with vomiting, unspecified: Secondary | ICD-10-CM

## 2018-01-08 DIAGNOSIS — D649 Anemia, unspecified: Secondary | ICD-10-CM

## 2018-01-08 DIAGNOSIS — R0789 Other chest pain: Principal | ICD-10-CM | POA: Insufficient documentation

## 2018-01-08 DIAGNOSIS — R1013 Epigastric pain: Secondary | ICD-10-CM

## 2018-01-08 DIAGNOSIS — F1010 Alcohol abuse, uncomplicated: Secondary | ICD-10-CM | POA: Insufficient documentation

## 2018-01-08 DIAGNOSIS — K2970 Gastritis, unspecified, without bleeding: Secondary | ICD-10-CM

## 2018-01-08 DIAGNOSIS — K861 Other chronic pancreatitis: Secondary | ICD-10-CM | POA: Insufficient documentation

## 2018-01-08 DIAGNOSIS — Z79899 Other long term (current) drug therapy: Secondary | ICD-10-CM | POA: Insufficient documentation

## 2018-01-08 LAB — BASIC METABOLIC PANEL
ANION GAP: 8 (ref 5–15)
CHLORIDE: 112 mmol/L — AB (ref 98–111)
CO2: 22 mmol/L (ref 22–32)
Calcium: 8.5 mg/dL — ABNORMAL LOW (ref 8.9–10.3)
Creatinine, Ser: 0.69 mg/dL (ref 0.61–1.24)
GFR calc Af Amer: >60 mL/min (ref >60)
GLUCOSE: 108 mg/dL — AB (ref 70–99)
POTASSIUM: 3.8 mmol/L (ref 3.5–5.1)
Sodium: 142 mmol/L (ref 135–145)

## 2018-01-08 LAB — RAPID URINE DRUG SCREEN, HOSP PERFORMED
AMPHETAMINES: NOT DETECTED
BENZODIAZEPINES: NOT DETECTED
Barbiturates: POSITIVE — AB
Cocaine: NOT DETECTED
Opiates: POSITIVE — AB
TETRAHYDROCANNABINOL: NOT DETECTED

## 2018-01-08 LAB — CBC
HEMATOCRIT: 32.6 % — AB (ref 39.0–52.0)
HEMOGLOBIN: 10.9 g/dL — AB (ref 13.0–17.0)
MCH: 28.6 pg (ref 26.0–34.0)
MCHC: 33.4 g/dL (ref 30.0–36.0)
MCV: 85.6 fL (ref 78.0–100.0)
Platelets: 224 10*3/uL (ref 150–400)
RBC: 3.81 MIL/uL — ABNORMAL LOW (ref 4.22–5.81)
RDW: 21.5 % — AB (ref 11.5–15.5)
WBC: 11 10*3/uL — AB (ref 4.0–10.5)

## 2018-01-08 LAB — URINALYSIS, ROUTINE W REFLEX MICROSCOPIC
Bilirubin Urine: NEGATIVE
Glucose, UA: NEGATIVE mg/dL
HGB URINE DIPSTICK: NEGATIVE
Ketones, ur: NEGATIVE mg/dL
LEUKOCYTES UA: NEGATIVE
Nitrite: NEGATIVE
PROTEIN: NEGATIVE mg/dL
SPECIFIC GRAVITY, URINE: 1.002 — AB (ref 1.005–1.030)
pH: 6 (ref 5.0–8.0)

## 2018-01-08 LAB — I-STAT TROPONIN, ED: Troponin i, poc: 0.01 ng/mL (ref 0.00–0.08)

## 2018-01-08 LAB — TROPONIN I: Troponin I: <0.03 ng/mL (ref <0.03)

## 2018-01-08 LAB — POC OCCULT BLOOD, ED: Fecal Occult Bld: NEGATIVE

## 2018-01-08 LAB — LIPASE, BLOOD: LIPASE: 50 U/L (ref 11–51)

## 2018-01-08 MED ORDER — SERTRALINE HCL 100 MG PO TABS
100.0000 mg | ORAL_TABLET | Freq: Every day | ORAL | Status: DC
  Administered 2018-01-08 – 2018-01-09 (×2): 100 mg via ORAL
  Filled 2018-01-08 (×2): qty 1

## 2018-01-08 MED ORDER — SODIUM CHLORIDE 0.9 % IV SOLN
INTRAVENOUS | Status: DC
  Administered 2018-01-08: 12:00:00 via INTRAVENOUS

## 2018-01-08 MED ORDER — FOLIC ACID 1 MG PO TABS
1.0000 mg | ORAL_TABLET | Freq: Every day | ORAL | Status: DC
  Administered 2018-01-08 – 2018-01-09 (×2): 1 mg via ORAL
  Filled 2018-01-08 (×2): qty 1

## 2018-01-08 MED ORDER — HYDROMORPHONE HCL 1 MG/ML IJ SOLN: 0.2500 mg | INTRAMUSCULAR | Status: DC | PRN

## 2018-01-08 MED ORDER — THIAMINE HCL 100 MG/ML IJ SOLN
100.0000 mg | Freq: Every day | INTRAMUSCULAR | Status: DC
  Administered 2018-01-08: 100 mg via INTRAVENOUS
  Filled 2018-01-08: qty 2

## 2018-01-08 MED ORDER — HYDROMORPHONE HCL 1 MG/ML IJ SOLN: 0.1500 mg | INTRAMUSCULAR | Status: DC | PRN

## 2018-01-08 MED ORDER — HYDROMORPHONE HCL 1 MG/ML IJ SOLN
0.1500 mg | INTRAMUSCULAR | Status: AC | PRN
  Administered 2018-01-08 – 2018-01-09 (×6): 0.15 mg via INTRAVENOUS
  Filled 2018-01-08 (×6): qty 1

## 2018-01-08 MED ORDER — HYDROXYZINE HCL 50 MG PO TABS
50.0000 mg | ORAL_TABLET | Freq: Three times a day (TID) | ORAL | Status: DC | PRN
  Administered 2018-01-08: 50 mg via ORAL
  Filled 2018-01-08 (×4): qty 1

## 2018-01-08 MED ORDER — MORPHINE SULFATE (PF) 4 MG/ML IV SOLN
4.0000 mg | Freq: Once | INTRAVENOUS | Status: AC
  Administered 2018-01-08: 4 mg via INTRAVENOUS
  Filled 2018-01-08: qty 1

## 2018-01-08 MED ORDER — METOPROLOL TARTRATE 25 MG PO TABS
25.0000 mg | ORAL_TABLET | Freq: Two times a day (BID) | ORAL | Status: DC
  Administered 2018-01-08 – 2018-01-09 (×3): 25 mg via ORAL
  Filled 2018-01-08 (×3): qty 1

## 2018-01-08 MED ORDER — ADULT MULTIVITAMIN W/MINERALS CH
1.0000 | ORAL_TABLET | Freq: Every day | ORAL | Status: DC
  Administered 2018-01-08 – 2018-01-09 (×2): 1 via ORAL
  Filled 2018-01-08 (×2): qty 1

## 2018-01-08 MED ORDER — GI COCKTAIL ~~LOC~~
30.0000 mL | Freq: Once | ORAL | Status: AC | PRN
  Administered 2018-01-08: 30 mL via ORAL
  Filled 2018-01-08: qty 30

## 2018-01-08 MED ORDER — ENOXAPARIN SODIUM 40 MG/0.4ML ~~LOC~~ SOLN
40.0000 mg | SUBCUTANEOUS | Status: DC
  Administered 2018-01-08 – 2018-01-09 (×2): 40 mg via SUBCUTANEOUS
  Filled 2018-01-08 (×2): qty 0.4

## 2018-01-08 MED ORDER — PANCRELIPASE (LIP-PROT-AMYL) 12000-38000 UNITS PO CPEP
12000.0000 [IU] | ORAL_CAPSULE | Freq: Three times a day (TID) | ORAL | Status: DC
  Administered 2018-01-08 – 2018-01-09 (×5): 12000 [IU] via ORAL
  Filled 2018-01-08 (×6): qty 1

## 2018-01-08 MED ORDER — PANTOPRAZOLE SODIUM 40 MG PO TBEC
40.0000 mg | DELAYED_RELEASE_TABLET | Freq: Two times a day (BID) | ORAL | Status: DC
  Administered 2018-01-08 – 2018-01-09 (×3): 40 mg via ORAL
  Filled 2018-01-08 (×3): qty 1

## 2018-01-08 MED ORDER — NITROGLYCERIN 2 % TD OINT
0.5000 [in_us] | TOPICAL_OINTMENT | Freq: Once | TRANSDERMAL | Status: AC
  Administered 2018-01-08: 0.5 [in_us] via TOPICAL
  Filled 2018-01-08: qty 1

## 2018-01-08 MED ORDER — GI COCKTAIL ~~LOC~~
30.0000 mL | Freq: Once | ORAL | Status: AC
  Administered 2018-01-08: 30 mL via ORAL
  Filled 2018-01-08: qty 30

## 2018-01-08 NOTE — ED Notes (Signed)
Nurse currently drawing labs 

## 2018-01-08 NOTE — H&P (Signed)
Date: 01/08/2018               Patient Name:  [name] MRN: [id number]  DOB: November 20, 1970 Age / Sex: 47 y.o., male   PCP: Synthia Innocent [name], NP         Medical Service: Internal Medicine Teaching Service         Attending Physician: Dr. Gilles Chiquito, MD    First Contact: Dr. Sharon Seller Pager: [phone]  Second Contact: Dr. Berline Lopes Pager: [phone]       After Hours (After 5p/  First Contact Pager: [phone]  weekends / holidays): Second Contact Pager: [phone]   Chief Complaint: Chest pain  History of Present Illness: [name] is a 48yo male with history of alcohol abuse, bipolar disorder, chronic pancreatitis, stomach ulcers, recent admission for UGI bleed (d/c 7/20), and WPW presenting with stabbing chest pain that woke him from sleep at about 4:30am this morning. He states the pain radiated to his jaw and was relieved by sublingual nitroglycerin and morphine from EMS. He states this is a new pain that has not occurred before. The pain is relieved by sitting up, is located in the central area of the chest, and is tender with palpation. He also endorses recent increase in SOB that is worsened by walking up stairs. He endorses nausea yesterday evening and vomiting coffee ground, brown emesis 2x.  He was recently admitted 7/18 for UGI and received 2U RBC for Hb 5.9 with EGD on 7/20 which showed stomach ulcers for which he was prescribed protonix and carafate. He states he has had some maroon discoloration to his stool and endorses dizziness the past few days.   He smokes 1/2 ppd cigarettes. He usually drinks a six-pack of beer per day, but has not had anything to drink in three days due to abdominal pain. He states he sometimes get anxiety and tremors when he does not drink for a few days but not severe. He denies drug use besides smoking marijuana on Saturday.   In the ED this morning patient is afebrile and normotensive with Hb 13.9. His chest pain was relieved by nitro/mophine,  but he states he continues to have abdominal pain due to his chronic pancreatitis and requests increased pain medication. Tropinins wnl.    Meds:  Current Meds  Medication Sig  . acetaminophen (TYLENOL) 500 MG tablet Take 1,000 mg by mouth every 4 (four) hours as needed for moderate pain.  . famotidine (PEPCID) 20 MG tablet Take 1 tablet (20 mg total) by mouth 2 (two) times daily.  . folic acid (FOLVITE) 1 MG tablet Take 1 tablet (1 mg total) by mouth daily.  . hydrOXYzine (VISTARIL) 50 MG capsule Take 50 mg by mouth 3 (three) times daily as needed for anxiety.   . lipase/protease/amylase (CREON) 12000 units CPEP capsule Take 1 capsule (12,000 Units total) by mouth 3 (three) times daily with meals. For pancreatitis  . loratadine (CLARITIN) 10 MG tablet Take 1 tablet (10 mg total) by mouth daily as needed for allergies or rhinitis. (May purchase from over the counter): For allergies  . metoprolol tartrate (LOPRESSOR) 25 MG tablet Take 1 tablet (25 mg total) by mouth 2 (two) times daily. For high blood pressure  . Multiple Vitamin (MULTIVITAMIN WITH MINERALS) TABS tablet Take 1 tablet by mouth daily.  . ondansetron (ZOFRAN) 4 MG tablet Take 1 tablet (4 mg total) by mouth every 8 (eight) hours as needed for nausea or vomiting.  . pantoprazole (  PROTONIX) 40 MG tablet Take 1 tablet (40 mg total) by mouth 2 (two) times daily.  . sertraline (ZOLOFT) 100 MG tablet Take 1 tablet (100 mg total) by mouth daily.  . sucralfate (CARAFATE) 1 g tablet Take 1 tablet (1 g total) by mouth 4 (four) times daily.  [name] thiamine 100 MG tablet Take 1 tablet (100 mg total) by mouth daily.  . traMADol (ULTRAM) 50 MG tablet 1-2 every 6 hours as needed for pain     Allergies: Allergies as of 01/08/2018 - Review Complete 01/08/2018  Allergen Reaction Noted  . Robaxin [methocarbamol] Other (See Comments) 07/20/2016  . Shellfish-derived products Nausea And Vomiting 08/18/2010  . Trazodone Other (See Comments) 09/28/2011    . Trazodone and nefazodone Other (See Comments) 09/28/2011  . Contrast media [iodinated diagnostic agents] Hives 12/04/2016  . Reglan [metoclopramide] Other (See Comments) 11/18/2015   Past Medical History:  Diagnosis Date  . Alcoholism /alcohol abuse ([place])   . Anemia   . Anxiety   . Arthritis    "knees; arms; elbows" (03/26/2015)  . Asthma   . Bipolar disorder ([place])   . Chronic bronchitis ([place])   . Chronic lower back pain   . Chronic pancreatitis ([place])   . Cocaine abuse ([place])   . Depression   . Family history of adverse reaction to anesthesia    "grandmother gets confused"  . Femoral condyle fracture ([place]) 03/08/2014   left medial/notes 03/09/2014  . GERD (gastroesophageal reflux disease)   . H/O hiatal hernia   . H/O suicide attempt 10/2012  . Heart murmur    "when he was little" (03/06/2013)  . High cholesterol   . History of blood transfusion 10/2012   "when I tried to commit suicide"  . History of stomach ulcers   . Hypertension   . Marijuana abuse, continuous   . Migraine    "a few times/year" (03/26/2015)  . Pneumonia 1990's X 3  . PTSD (post-traumatic stress disorder)   . Shortness of breath    "can happen at anytime" (03/06/2013)  . Sickle cell trait ([place])   . WPW (Wolff-Parkinson-White syndrome)    [name] 03/06/2013    Family History:  Maternal grandparents: history of heart disease with pacemaker  Social History: He states he smokes 1/2 ppd for >20 years, usually drinks a 6 pack/day, he endorses marijuana use but no other illegal drugs.   Review of Systems: A complete ROS was negative except as per HPI.   Physical Exam: Blood pressure 124/84, pulse 89, temperature 98.3 F (36.8 C), temperature source Oral, resp. rate 15, height 5\' 9"  (1.753 m), weight 59 kg, SpO2 100 %.  Constitution: NAD, supine in bed, appears stated age 75: AT/[place], no scleral icterus, EOM intact Cardio: RRR, no m/r/g, TTP over sternum  Respiratory: clear to auscultation  bilaterally, no wheezing, rales, rhonchi Abdominal: +BS, diffusely TTP, especially mid-epigastric, soft, non-distended MSK: +pedal and radial pulses, no edema Neuro: A&Ox3, normal affect, cooperative Skin: C/d/i  EKG: personally reviewed my interpretation is tachycardia with t-wave inversions II, III, aVF not present on previous EKG 8/28 but present on EKG 8/21.    CXR: personally reviewed my interpretation is hyperinflation with no acute abnormalities.   Assessment & Plan by Problem: Active Problems:   * No active hospital problems. *  47yo male with frequent ED visits for chronic pancreatic pain with history of alcohol abuse, polysubstance abuse, bipolar disorder, stomach ulcers, recent admission for UGI bleed (d/c 7/20), and WPW presenting with atypical  chest pain.  Atypical Chest Pain: No history of ACS or CAD. Pain is reproducible with palpation along sternum, relieved by sitting up, radiates to jaw and is stabbing in nature - trended troponins have so far been normal. Considering he has had recent increases in SOB with exertion we will obtain echo. Last echo was 2014 with normal systolic function and mild hypertrophy.   - trend troponin - echo pending  - cholesterol panel - am CMP  - UDS  History of UGI: He is s/p 2U transfusion 11/25/2017 and EGD at that time showing non-bleeding ulcerations, stable at discharge. Hb today is decreased to 10.9 from 14.7 on 8/26. Pt states his vomit appeared dark brown, like coffee ground two times yesterday evening and had some maroon colored stool. FOBT negative   - ferritin pending - am CBC - GI cocktail  - protonix 40mg  bid  Chronic Pancreatitis: Pt endorses abdominal pain and requests pain medication.   - cont. Home Creon  Alcohol Abuse: patient states he becomes anxious/tremulous when he stops drinking. No history of DTs.  - CIWA protocol without ativan currently - thiamine - hydroxyzine for anxiety   Diet: regular VTE: SCDs,  LVX IVF: none Code: full  Dispo: Admit patient to Observation with expected length of stay less than 2 midnights.  SignedMarty Heck, DO 01/08/2018, 8:22 AM  Pager: [phone]

## 2018-01-08 NOTE — ED Triage Notes (Signed)
Pt coming from home by ems. Pt c/o chest pain that came on about a hour ago central chest pain that goes to the jaw and left arm. Ems gave 2 nitros and 6mg  of morphine that dropped pt pain from 10/10 to a 6/10 pain at this time.

## 2018-01-08 NOTE — ED Provider Notes (Signed)
[facility] EMERGENCY DEPARTMENT Provider Note   CSN: [id number] Arrival date & time: 01/08/18  6213     History   Chief Complaint Chief Complaint  Patient presents with  . Chest Pain  . Abdominal Pain    HPI [name] is a 47 y.o. male with history of alcohol abuse, polysubstance abuse, bipolar disorder, chronic pancreatitis, stomach ulcers, migraines, WPW presents for evaluation of acute onset, constant chest pain since this morning.  He states the pain awoke him at around 5 AM.  Pain is substernal, sharp and stabbing and radiates to the left jaw.  He does note dyspnea on exertion and lightheadedness.  He has had some nausea and yesterday had 2 episodes of nonbloody nonbilious emesis after having dinner but states it was "brown" in color.  EMS gave 2 sublingual nitroglycerin and 6 mg of morphine which improved the pain from 10/10 to a 6/10 in severity.  No recent travel or surgeries, no hemoptysis, no prior history of DVT or PE.  He is not on testosterone hormone ablation therapy.  He is a current smoker, occasionally smokes marijuana but also has a history of cocaine use.  Of note, the patient was recently admitted for blood transfusion secondary to GI bleed.  He denies melanotic stools.  Last alcoholic beverage was 4 days ago.  He states he has not been taking ibuprofen.  He states the medications he has been taking have not been helpful.  He does note ongoing epigastric and left upper quadrant pain which he describes as sharp in nature.  The history is provided by the patient.  Abdominal Pain   Associated symptoms include nausea and vomiting. Pertinent negatives include fever, diarrhea and constipation.    Past Medical History:  Diagnosis Date  . Alcoholism /alcohol abuse ([place])   . Anemia   . Anxiety   . Arthritis    "knees; arms; elbows" (03/26/2015)  . Asthma   . Bipolar disorder ([place])   . Chronic bronchitis ([place])   . Chronic lower back pain     . Chronic pancreatitis ([place])   . Cocaine abuse ([place])   . Depression   . Family history of adverse reaction to anesthesia    "grandmother gets confused"  . Femoral condyle fracture ([place]) 03/08/2014   left medial/notes 03/09/2014  . GERD (gastroesophageal reflux disease)   . H/O hiatal hernia   . H/O suicide attempt 10/2012  . Heart murmur    "when he was little" (03/06/2013)  . High cholesterol   . History of blood transfusion 10/2012   "when I tried to commit suicide"  . History of stomach ulcers   . Hypertension   . Marijuana abuse, continuous   . Migraine    "a few times/year" (03/26/2015)  . Pneumonia 1990's X 3  . PTSD (post-traumatic stress disorder)   . Shortness of breath    "can happen at anytime" (03/06/2013)  . Sickle cell trait ([place])   . WPW (Wolff-Parkinson-White syndrome)    [name] 03/06/2013    Patient Active Problem List   Diagnosis Date Noted  . Chest pain 01/08/2018  . GI bleed 11/24/2017  . Acute blood loss anemia 11/24/2017  . Atypical chest pain 11/24/2017  . Acute pancreatitis 09/28/2017  . Abdominal pain 05/27/2017  . Hematemesis 05/27/2017  . Tachycardia 03/18/2017  . Diarrhea 03/18/2017  . Acute on chronic pancreatitis ([place]) 12/17/2016  . Intractable nausea and vomiting 12/05/2016  . Verbally abusive behavior 12/05/2016  . Normocytic  anemia 12/05/2016  . Alcohol use disorder, severe, dependence ([place]) 07/25/2016  . Cocaine use disorder, severe, dependence ([place]) 07/25/2016  . Major depressive disorder, recurrent severe without psychotic features ([place]) 07/20/2016  . Leukocytosis   . Hospital acquired PNA 05/20/2015  . Chronic pancreatitis ([place]) 05/18/2015  . Pseudocyst of pancreas 05/18/2015  . Polysubstance abuse (tobacco, cocaine, THC, and ETOH) 03/26/2015  . Alcohol-induced chronic pancreatitis ([place])   . Benign essential HTN 02/06/2014  . Alcohol-induced acute pancreatitis 11/28/2013  . Pancreatic pseudocyst/cyst 11/25/2013  . Severe  protein-calorie malnutrition ([place]) 10/10/2013  . Suicide attempt ([place]) 10/08/2013  . 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 TOBACCO ABUSE 03/23/2007    Past Surgical History:  Procedure Laterality Date  . BIOPSY  11/25/2017   Procedure: BIOPSY;  Surgeon: Arta Silence, MD;  Location: [facility];  Service: Endoscopy;;  . CARDIAC CATHETERIZATION    . ESOPHAGOGASTRODUODENOSCOPY (EGD) WITH PROPOFOL N/A 11/25/2017   Procedure: ESOPHAGOGASTRODUODENOSCOPY (EGD) WITH PROPOFOL;  Surgeon: Arta Silence, MD;  Location: [facility];  Service: Endoscopy;  Laterality: N/A;  . EYE SURGERY Left 1990's   "result of trauma"   . FACIAL FRACTURE SURGERY Left 1990's   "result of trauma"   . FRACTURE SURGERY    . HERNIA REPAIR    . LEFT HEART CATHETERIZATION WITH CORONARY ANGIOGRAM Right 03/07/2013   Procedure: LEFT HEART CATHETERIZATION WITH CORONARY ANGIOGRAM;  Surgeon: [name], MD;  Location: [facility] CATH [facility];  Service: Cardiovascular;  Laterality: Right;  . UMBILICAL HERNIA REPAIR          Home Medications    Prior to Admission medications   Medication Sig Start Date End Date Taking? Authorizing Provider  acetaminophen (TYLENOL) 500 MG tablet Take 1,000 mg by mouth every 4 (four) hours as needed for moderate pain.   Yes [provider]  famotidine (PEPCID) 20 MG tablet Take 1 tablet (20 mg total) by mouth 2 (two) times daily. 9/38/18  Yes Delora Fuel, MD  folic acid (FOLVITE) 1 MG tablet Take 1 tablet (1 mg total) by mouth daily. 11/26/17  Yes [name], MD  hydrOXYzine (VISTARIL) 50 MG capsule Take 50 mg by mouth 3 (three) times daily as needed for anxiety.    Yes [provider]  lipase/protease/amylase (CREON) 12000 units CPEP capsule Take 1 capsule (12,000 Units total) by mouth 3 (three) times daily with meals. For pancreatitis 10/01/17  Yes Aline August, MD  loratadine (CLARITIN) 10 MG tablet Take 1 tablet (10 mg total) by mouth  daily as needed for allergies or rhinitis. (May purchase from over the counter): For allergies 10/01/17  Yes Aline August, MD  metoprolol tartrate (LOPRESSOR) 25 MG tablet Take 1 tablet (25 mg total) by mouth 2 (two) times daily. For high blood pressure 08/29/17  Yes Clent Demark, PA-C  Multiple Vitamin (MULTIVITAMIN WITH MINERALS) TABS tablet Take 1 tablet by mouth daily. 09/06/17  Yes Bonnell Public, MD  ondansetron (ZOFRAN) 4 MG tablet Take 1 tablet (4 mg total) by mouth every 8 (eight) hours as needed for nausea or vomiting. 2/99/37  Yes Delora Fuel, MD  pantoprazole (PROTONIX) 40 MG tablet Take 1 tablet (40 mg total) by mouth 2 (two) times daily. 1/69/67  Yes Delora Fuel, MD  sertraline (ZOLOFT) 100 MG tablet Take 1 tablet (100 mg total) by mouth daily. 08/29/17  Yes Clent Demark, PA-C  sucralfate (CARAFATE) 1 g tablet Take 1 tablet (1 g total) by mouth 4 (four) times daily.  01/01/04  Yes Delora Fuel, MD  thiamine 100 MG tablet Take 1 tablet (100 mg total) by mouth daily. 11/26/17  Yes [name], MD  traMADol Veatrice Bourbon) 50 MG tablet 1-2 every 6 hours as needed for pain 11/26/17  Yes Pfeiffer, Jeannie Done, MD  albuterol (PROVENTIL HFA;VENTOLIN HFA) 108 (90 Base) MCG/ACT inhaler Inhale 2 puffs into the lungs every 6 (six) hours as needed for wheezing or shortness of breath. Patient not taking: Reported on 12/18/2017 08/29/17   Clent Demark, PA-C  hydrALAZINE (APRESOLINE) 25 MG tablet Take 1 tablet (25 mg total) by mouth every 8 (eight) hours. Patient not taking: Reported on 12/18/2017 08/29/17   Clent Demark, PA-C  omeprazole (PRILOSEC) 20 MG capsule Take 1 capsule (20 mg total) daily by mouth. 03/22/17 06/21/17  Varney Biles, MD    Family History Family History  Problem Relation Age of Onset  . Hypertension Mother   . Cirrhosis Mother   . Alcoholism Mother   . Hypertension Father   . Melanoma Father   . Hypertension Other   . Coronary artery disease Other      Social History Social History   Tobacco Use  . Smoking status: Current Every Day Smoker    Packs/day: 1.00    Years: 33.00    Pack years: 33.00    Types: Cigarettes, E-cigarettes  . Smokeless tobacco: Never Used  Substance Use Topics  . Alcohol use: Yes  . Drug use: Yes    Types: Marijuana, Cocaine    Comment: daily marijuana use; last cocaine use about 3 months ago     Allergies   Robaxin [methocarbamol]; Shellfish-derived products; Trazodone; Trazodone and nefazodone; Contrast media [iodinated diagnostic agents]; and Reglan [metoclopramide]   Review of Systems Review of Systems  Constitutional: Negative for chills, diaphoresis and fever.  Respiratory: Positive for shortness of breath.   Cardiovascular: Positive for chest pain.  Gastrointestinal: Positive for abdominal pain, nausea and vomiting. Negative for constipation and diarrhea.  Neurological: Positive for light-headedness. Negative for syncope.  All other systems reviewed and are negative.    Physical Exam Updated Vital Signs BP (!) 143/96 (BP Location: Right Arm)   Pulse 65   Temp 97.8 F (36.6 C) (Oral)   Resp 18   Ht 5\' 9"  (1.753 m)   Wt 56.3 kg   SpO2 93%   BMI 18.34 kg/m   Physical Exam  Constitutional: He appears well-developed and well-nourished. No distress.  HENT:  Head: Normocephalic and atraumatic.  Eyes: Conjunctivae are normal. Right eye exhibits no discharge. Left eye exhibits no discharge.  Neck: Normal range of motion. Neck supple. No JVD present. No tracheal deviation present.  Cardiovascular: Normal rate and regular rhythm.  2+ radial and DP/PT pulses bilaterally, Homans sign absent bilaterally, no lower extremity edema, no palpable cords, compartments are soft   Pulmonary/Chest: Effort normal and breath sounds normal.  No tenderness to palpation of the chest wall  Abdominal: Soft. Bowel sounds are normal. He exhibits no distension. There is tenderness in the left upper  quadrant. There is no rigidity, no rebound, no guarding and no CVA tenderness.  Genitourinary:  Genitourinary Comments: Examination performed in the presence of a chaperone.  No frank rectal bleeding.  No anal fissures or tears.  Scant stool in the rectal vault.  Musculoskeletal: He exhibits no edema.       Right lower leg: Normal. He exhibits no tenderness and no edema.       Left lower leg: Normal. He  exhibits no tenderness and no edema.  Neurological: He is alert.  Skin: Skin is warm. No erythema.  Psychiatric: He has a normal mood and affect. His behavior is normal.  Nursing note and vitals reviewed.    ED Treatments / Results  Labs (all labs ordered are listed, but only abnormal results are displayed) Labs Reviewed  BASIC METABOLIC PANEL - Abnormal; Notable for the following components:      Result Value   Chloride 112 (*)    Glucose, Bld 108 (*)    BUN <5 (*)    Calcium 8.5 (*)    All other components within normal limits  CBC - Abnormal; Notable for the following components:   WBC 11.0 (*)    RBC 3.81 (*)    Hemoglobin 10.9 (*)    HCT 32.6 (*)    RDW 21.5 (*)    All other components within normal limits  URINALYSIS, ROUTINE W REFLEX MICROSCOPIC - Abnormal; Notable for the following components:   Color, Urine COLORLESS (*)    Specific Gravity, Urine 1.002 (*)    All other components within normal limits  LIPASE, BLOOD  TROPONIN I  TROPONIN I  TROPONIN I  RAPID URINE DRUG SCREEN, HOSP PERFORMED  I-STAT TROPONIN, ED  POC OCCULT BLOOD, ED    EKG EKG Interpretation  Date/Time:  Monday January 08 2018 05:18:19 EDT Ventricular Rate:  104 PR Interval:    QRS Duration: 79 QT Interval:  298 QTC Calculation: 392 R Axis:   77 Text Interpretation:  Sinus tachycardia Biatrial enlargement Borderline repolarization abnormality Confirmed by Orpah Greek [phone]) on 01/08/2018 6:28:50 AM Also confirmed by Orpah Greek [phone]), editor Philomena Doheny [phone])   on 01/08/2018 8:27:37 AM   Radiology Dg Chest 2 View  Result Date: 01/08/2018 CLINICAL DATA:  47 year old male with chest pain. EXAM: CHEST - 2 VIEW COMPARISON:  Chest radiograph dated 11/26/2017 FINDINGS: The heart size and mediastinal contours are within normal limits. Both lungs are clear. The visualized skeletal structures are unremarkable. IMPRESSION: No active cardiopulmonary disease. Electronically Signed   By: Anner Crete M.D.   On: 01/08/2018 06:01    Procedures Procedures (including critical care time)  Medications Ordered in ED Medications  enoxaparin (LOVENOX) injection 40 mg (40 mg Subcutaneous Given 01/08/18 1146)  metoprolol tartrate (LOPRESSOR) tablet 25 mg (25 mg Oral Given 01/08/18 1144)  pantoprazole (PROTONIX) EC tablet 40 mg (40 mg Oral Given 01/08/18 1144)  sertraline (ZOLOFT) tablet 100 mg (100 mg Oral Given 01/08/18 1144)  hydrOXYzine (VISTARIL) capsule 50 mg (has no administration in time range)  lipase/protease/amylase (CREON) capsule 12,000 Units (12,000 Units Oral Given 01/08/18 1616)  gi cocktail (Maalox,Lidocaine,Donnatal) (has no administration in time range)  thiamine (B-1) injection 100 mg (100 mg Intravenous Given 05/13/95 0263)  folic acid (FOLVITE) tablet 1 mg (1 mg Oral Given 01/08/18 1144)  multivitamin with minerals tablet 1 tablet (1 tablet Oral Given 01/08/18 1144)  HYDROmorphone (DILAUDID) injection 0.15 mg (0.15 mg Intravenous Given 01/08/18 1152)  gi cocktail (Maalox,Lidocaine,Donnatal) (30 mLs Oral Given 01/08/18 0651)  morphine 4 MG/ML injection 4 mg (4 mg Intravenous Given 01/08/18 0736)  nitroGLYCERIN (NITROGLYN) 2 % ointment 0.5 inch (0.5 inches Topical Given 01/08/18 0735)     Initial Impression / Assessment and Plan / ED Course  I have reviewed the triage vital signs and the nursing notes.  Pertinent labs & imaging results that were available during my care of the patient were reviewed by me and considered in  my medical decision making (see chart for  details).     Patient with complaint of substernal chest pain radiating to the left side of the jaw that awoke him at around 430 this morning.  He is afebrile, vital signs are stable.  Nontoxic in appearance.  Also notes chronic upper abdominal pain with 2 episodes of coffee-ground emesis last night.  Lab work shows EKG with changes consistent with possible left ventricular hypertrophy.  Pain was relieved somewhat with sublingual nitroglycerin and morphine with EMS.  Initial troponin negative however patient has a HEART score of 4 and presentation is concerning for possible cardiac etiology of his symptoms.  He would benefit from admission to the hospital for further evaluation.  Additionally of note, his hemoglobin has dropped 4 points in the last 7 days with recent admission for GI bleed requiring blood transfusion.  Hemoccult is negative today although he had scant amount of stool in the rectal vault.  Doubt acute surgical abdominal pathology. 8:11 AM Spoke with internal medicine teaching service resident, agree to assume care patient further evaluation and management.  Final Clinical Impressions(s) / ED Diagnoses   Final diagnoses:  Atypical chest pain  Epigastric pain  Non-intractable vomiting with nausea, unspecified vomiting type    ED Discharge Orders    None       Renita Papa, PA-C 01/08/18 1701    Orpah Greek, MD 01/17/18 2310

## 2018-01-09 ENCOUNTER — Inpatient Hospital Stay (HOSPITAL_COMMUNITY): Payer: Self-pay

## 2018-01-09 DIAGNOSIS — K861 Other chronic pancreatitis: Secondary | ICD-10-CM

## 2018-01-09 DIAGNOSIS — K2990 Gastroduodenitis, unspecified, without bleeding: Secondary | ICD-10-CM

## 2018-01-09 DIAGNOSIS — R1013 Epigastric pain: Secondary | ICD-10-CM

## 2018-01-09 DIAGNOSIS — R0789 Other chest pain: Secondary | ICD-10-CM

## 2018-01-09 DIAGNOSIS — K2970 Gastritis, unspecified, without bleeding: Secondary | ICD-10-CM

## 2018-01-09 DIAGNOSIS — R079 Chest pain, unspecified: Secondary | ICD-10-CM

## 2018-01-09 LAB — CBC
HCT: 34.1 % — ABNORMAL LOW (ref 39.0–52.0)
HEMOGLOBIN: 11.6 g/dL — AB (ref 13.0–17.0)
MCH: 29.1 pg (ref 26.0–34.0)
MCHC: 34 g/dL (ref 30.0–36.0)
MCV: 85.5 fL (ref 78.0–100.0)
Platelets: 205 10*3/uL (ref 150–400)
RBC: 3.99 MIL/uL — ABNORMAL LOW (ref 4.22–5.81)
RDW: 20.8 % — AB (ref 11.5–15.5)
WBC: 8.1 10*3/uL (ref 4.0–10.5)

## 2018-01-09 LAB — COMPREHENSIVE METABOLIC PANEL
ALBUMIN: 3.2 g/dL — AB (ref 3.5–5.0)
ALK PHOS: 83 U/L (ref 38–126)
ALT: 66 U/L — AB (ref 0–44)
ANION GAP: 7 (ref 5–15)
AST: 83 U/L — ABNORMAL HIGH (ref 15–41)
BILIRUBIN TOTAL: 0.8 mg/dL (ref 0.3–1.2)
BUN: 6 mg/dL (ref 6–20)
CALCIUM: 8.7 mg/dL — AB (ref 8.9–10.3)
CO2: 24 mmol/L (ref 22–32)
CREATININE: 0.62 mg/dL (ref 0.61–1.24)
Chloride: 106 mmol/L (ref 98–111)
GFR calc Af Amer: >60 mL/min (ref >60)
GFR calc non Af Amer: >60 mL/min (ref >60)
GLUCOSE: 89 mg/dL (ref 70–99)
Potassium: 4.1 mmol/L (ref 3.5–5.1)
SODIUM: 137 mmol/L (ref 135–145)
Total Protein: 6.1 g/dL — ABNORMAL LOW (ref 6.5–8.1)

## 2018-01-09 LAB — FERRITIN: FERRITIN: 79 ng/mL (ref 24–336)

## 2018-01-09 LAB — ECHOCARDIOGRAM COMPLETE
Height: 69 in
WEIGHTICAEL: 1988.8 [oz_av]

## 2018-01-09 LAB — LIPID PANEL
CHOL/HDL RATIO: 2 ratio
Cholesterol: 156 mg/dL (ref 0–200)
HDL: 79 mg/dL (ref >40)
LDL Cholesterol: 65 mg/dL (ref 0–99)
Triglycerides: 59 mg/dL (ref <150)
VLDL: 12 mg/dL (ref 0–40)

## 2018-01-09 MED ORDER — ACETAMINOPHEN-CODEINE #3 300-30 MG PO TABS: 1.0000 | ORAL_TABLET | ORAL | 0 refills | Status: AC | PRN

## 2018-01-09 MED ORDER — HYDROMORPHONE HCL 1 MG/ML IJ SOLN
0.1500 mg | INTRAMUSCULAR | Status: DC | PRN
  Administered 2018-01-09: 0.15 mg via INTRAVENOUS
  Filled 2018-01-09: qty 1

## 2018-01-09 MED ORDER — GI COCKTAIL ~~LOC~~
30.0000 mL | Freq: Once | ORAL | Status: AC | PRN
  Administered 2018-01-09: 30 mL via ORAL
  Filled 2018-01-09: qty 30

## 2018-01-09 MED ORDER — VITAMIN B-1 100 MG PO TABS
100.0000 mg | ORAL_TABLET | Freq: Every day | ORAL | Status: DC
  Administered 2018-01-09: 100 mg via ORAL
  Filled 2018-01-09: qty 1

## 2018-01-09 MED ORDER — SUCRALFATE 1 G PO TABS
1.0000 g | ORAL_TABLET | Freq: Four times a day (QID) | ORAL | Status: DC
  Administered 2018-01-09 (×2): 1 g via ORAL
  Filled 2018-01-09 (×2): qty 1

## 2018-01-09 NOTE — Discharge Summary (Signed)
Name: [name] MRN: [id number] DOB: 1970/12/18 47 y.o. PCP: Jason Coots, NP  Date of Admission: 01/08/2018  5:09 AM Date of Discharge: 01/09/2018 Attending Physician: Jason Falcon, MD  Discharge Diagnosis: 1. Atypical Chest Pain 2. Gastritis 3. Chronic Pancreatitis  4. Alcohol Abuse  Discharge Medications: Allergies as of 01/09/2018      Reactions   Robaxin [methocarbamol] Other (See Comments)   "jumpy limbs"   Shellfish-derived Products Nausea And Vomiting   Trazodone Other (See Comments)   Muscle spasms   Trazodone And Nefazodone Other (See Comments)   Muscle spasms   Contrast Media [iodinated Diagnostic Agents] Hives   Reglan [metoclopramide] Other (See Comments)   Muscle spasms      Medication List    STOP taking these medications   acetaminophen 500 MG tablet Commonly known as:  TYLENOL   albuterol 108 (90 Base) MCG/ACT inhaler Commonly known as:  PROVENTIL HFA;VENTOLIN HFA   famotidine 20 MG tablet Commonly known as:  PEPCID   hydrALAZINE 25 MG tablet Commonly known as:  APRESOLINE   traMADol 50 MG tablet Commonly known as:  ULTRAM     TAKE these medications   acetaminophen-codeine 300-30 MG tablet Commonly known as:  TYLENOL #3 Take 1 tablet by mouth every 4 (four) hours as needed for up to 3 days for moderate pain or severe pain.   folic acid 1 MG tablet Commonly known as:  FOLVITE Take 1 tablet (1 mg total) by mouth daily.   hydrOXYzine 50 MG capsule Commonly known as:  VISTARIL Take 50 mg by mouth 3 (three) times daily as needed for anxiety.   lipase/protease/amylase 12000 units Cpep capsule Commonly known as:  CREON Take 1 capsule (12,000 Units total) by mouth 3 (three) times daily with meals. For pancreatitis   loratadine 10 MG tablet Commonly known as:  CLARITIN Take 1 tablet (10 mg total) by mouth daily as needed for allergies or rhinitis. (May purchase from over the counter): For allergies   metoprolol tartrate 25  MG tablet Commonly known as:  LOPRESSOR Take 1 tablet (25 mg total) by mouth 2 (two) times daily. For high blood pressure   multivitamin with minerals Tabs tablet Take 1 tablet by mouth daily.   ondansetron 4 MG tablet Commonly known as:  ZOFRAN Take 1 tablet (4 mg total) by mouth every 8 (eight) hours as needed for nausea or vomiting.   pantoprazole 40 MG tablet Commonly known as:  PROTONIX Take 1 tablet (40 mg total) by mouth 2 (two) times daily.   sertraline 100 MG tablet Commonly known as:  ZOLOFT Take 1 tablet (100 mg total) by mouth daily.   sucralfate 1 g tablet Commonly known as:  CARAFATE Take 1 tablet (1 g total) by mouth 4 (four) times daily.   thiamine 100 MG tablet Take 1 tablet (100 mg total) by mouth daily.       Disposition and follow-up:   [name] was discharged from [facility] in Stable condition.  At the hospital follow up visit please address:  1.  - Gastritis/History of UGI: Decrease in Hb from 14.7 8/26 to 11.6 on admission 9/2. FOBT, no source of bleeding. Pain may be related to his chronic pancreatitis but will need monitoring for recurrence of GI bleed due to the fact he continues to drink heavily.   2.  Labs / imaging needed at time of follow-up: H&H  3.  Pending labs/ test needing follow-up: none  Follow-up Appointments: Follow-up [facility].   Why:  Please call his location to schedule an appointment with Pain Management Clinic.  Contact information: [address] [place] [address] [phone]        Management, [facility].   Specialty:  Pain Medicine Why:  Please call his location to schedule an appointment with Pain Management Clinic.  Contact information: [address] [place] [address] [phone]           Hospital Course by problem list: 47yo male with frequent ED visits for chronic pancreatitis  and pain with history of alcohol abuse, polysubstance abuse, bipolar disorder, stomach ulcers, recent admission for UGI bleed (d/c 7/20), and WPW who presented with atypical chest pain and history of vomiting. Workup for ACS, pericarditis or other cardiac etiology was negative. Trended troponins and Echo were within normal limits. While initial EKG findings were significant for tachycardia with T-wave inversions, these findings resolved on subsequent EKGs. Recurrence of GI bleed was also unlikely as FOBT was negative and hemoglobin remained stable throughout admission. In addition, his pain was relieved with GI cocktail and carafate, and his symptoms were thought to be secondary to his gastritis and chronic pancreatic pain.   Discharge Vitals:   BP (!) 145/97 (BP Location: Right Arm)   Pulse 68   Temp 98.3 F (36.8 C) (Oral)   Resp 15   Ht 5\' 9"  (1.753 m)   Wt 56.4 kg   SpO2 98%   BMI 18.36 kg/m   Pertinent Labs, Studies, and Procedures:   Troponin: <.03 x3  Echo 01/09/2018 Study Conclusions  - Left ventricle: The cavity size was normal. Systolic function was   normal. The estimated ejection fraction was in the range of 60%   to 65%. Wall motion was normal; there were no regional wall   motion abnormalities. Left ventricular diastolic function   parameters were normal. - Aortic valve: There was no significant regurgitation. - Mitral valve: There was trivial regurgitation. - Right ventricle: Systolic function was normal. - Atrial septum: No defect or patent foramen ovale was identified. - Tricuspid valve: There was trivial regurgitation. - Pulmonic valve: There was no significant regurgitation. - Pulmonary arteries: Systolic pressure was mildly increased. PA   peak pressure: 32 mm Hg (S).   CXR 01/08/2018: Impression: No active cardiopulmonary disease    Discharge Instructions: Discharge Instructions    Diet - low sodium heart healthy   Complete by:  As directed    Discharge  instructions   Complete by:  As directed    FOLLOW-UP INSTRUCTIONS When: Your primary doctor [facility].   For: All of your chronic abdominal pain and if your upper abdominal pain persists.  What to bring: all of your medication bottles     You were hospitalized for chest pain rule out and abdominal pain. Thank you for allowing [place] to be part of your care.    Please don't hesitate to call if this doesn't work for your schedule.   Please note these changes made to your medications:  You have been given a script for tylenol #3 for three days. Take this sparingly to assist with your pain.   Increase activity slowly   Complete by:  As directed       Signed: Marty Heck, DO 01/09/2018, 9:23 PM   Pager: [phone]

## 2018-01-09 NOTE — Care Management Note (Signed)
Case Management Note  Patient Details  Name: [name] MRN: [id number] Date of Birth: [birth year]  Subjective/Objective:  Pt presented for Chest / Abdominal Pain. PTA from home with girlfriend. Pt is without Insurance and has PCP at [facility] with Marliss Coots NP. [facility]. Medications are mailed to the [facility] to make sure that the patient is able to receive.                    Action/Plan: Patient did ask about Pain Management Clinics. CM did place names of Clinics on AVS in order for patient to call and schedule appointment. No further needs from CM at this time.   Expected Discharge Date:                  Expected Discharge Plan:  Home/Self Care  In-House Referral:  NA  Discharge planning Services  CM Consult, Medication Assistance  Post Acute Care Choice:  NA Choice offered to:  NA  DME Arranged:  N/A DME Agency:  NA  HH Arranged:  NA HH Agency:  NA  Status of Service:  Completed, signed off  If discussed at [place] of Stay Meetings, dates discussed:    Additional Comments:  Bethena Roys, RN 01/09/2018, 3:51 PM

## 2018-01-09 NOTE — Progress Notes (Signed)
   Subjective: Patient states he continues to have abdominal pain this morning and requests GI cocktail which helps relieve his pain. The pain is epigastric and midsternal. He continues to state it is relieved by sitting up and he describes the pain as a stretching. He states it does not radiate to his jaw today. He denies SOB.   Objective:  Vital signs in last 24 hours: Vitals:   01/08/18 2030 01/09/18 0546 01/09/18 0922 01/09/18 1529  BP: (!) 156/109 (!) 156/100 (!) 151/95 (!) 145/97  Pulse: 78 71 72 68  Resp: 15 15    Temp: 98 F (36.7 C) (!) 97.5 F (36.4 C)  98.3 F (36.8 C)  TempSrc: Oral Oral  Oral  SpO2: 100% 100%  98%  Weight:  56.4 kg    Height:       Constitution: NAD, sitting up in bed HEENT: AT/[place], no scleral icterus, EOM intact Cardio: regular rate & rhythm, no m/r/g, TTP over sternum  Respiratory: clear to auscultation bilaterally, no wheezing, rales, rhonchi Abdominal: +BS, TTP in mid-epigastric, soft, non-distended MSK: +pedal and radial pulses, no edema Neuro: alert & oriented, normal affect, cooperative Skin: Clean, dry, intact  Assessment/Plan:  Active Problems:   Chest pain   Gastritis and gastroduodenitis  47yo male with frequent ED visits for chronic pancreatic pain with history of alcohol abuse, polysubstance abuse, bipolar disorder, stomach ulcers, recent admission for UGI bleed (d/c 7/20), and WPW presenting with atypical chest pain.  Gastritis:  Echo today with normal systolic and diastolic function. Repeat EKGx2 with normal sinus rhythm. Considering symptoms and symptom relief with GI cocktail his pain is likely secondary to gastritis, with non-bleeding ulcers seen on EGD 7/20 and to his chronic pancreatitis. No further work-necessary, he will need to follow-up outpatient with his GI doctor for ongoing management of symptoms.   History of UGI: He is s/p 2U transfusion 11/25/2017 and EGD at that time showing non-bleeding ulcerations, stable at  discharge. Hb today is decreased to 11.6  from 14.7 on 8/26. Patient has not had an episode of emesis since admission. Ferritin wnl.   Chronic Pancreatitis: Pt endorses abdominal pain and requests pain medication.   - cont. Home Creon - d/c with Tylenol 3 for abdominal pain   Alcohol Abuse: patient states he becomes anxious/tremulous when he stops drinking. No symptoms of withdrawal.   - CIWA protocol without ativan currently - thiamine - hydroxyzine for anxiety   Diet: regular VTE: SCDs, LVX IVF: none Code: full  Dispo: Anticipated discharge today.   Marty Heck, DO 01/09/2018, 4:54 PM Pager: [phone]

## 2018-01-09 NOTE — Progress Notes (Addendum)
Initial Nutrition Assessment  DOCUMENTATION CODES:   Severe malnutrition in context of chronic illness  INTERVENTION:    Ensure Enlive po TID, each supplement provides 350 kcal and 20 grams of protein  Diet education provided for chronic pancreatitis per patient request  NUTRITION DIAGNOSIS:   Severe Malnutrition related to chronic illness(chronic pancreatitis) as evidenced by energy intake < or equal to 75% for > or equal to 1 month, percent weight loss(14% weight loss within one month).  GOAL:   Patient will meet greater than or equal to 90% of their needs  MONITOR:   PO intake, Supplement acceptance, Labs  REASON FOR ASSESSMENT:   Malnutrition Screening Tool    ASSESSMENT:   47 yo male with PMH of bipolar d/o, cocaine abuse, depression, suicide attempt, anemia, GERD, WPW syndrome, HLD, alcoholism, sickle cell trait, PTSD, marijuana abuse, tobacco abuse, HTN, and chronic pancreatitis who was admitted on 9/2 with chest pain.   Patient reports poor intake related to abdominal pain from ulcers for the past month. He endorses a lot of weight loss. Per reported intake PTA, he has been consuming < 50% of estimated energy requirement for the past month.   Patient requests diet education for chronic pancreatitis and Ensure supplements between meals.   Labs reviewed. Medications reviewed and include folic acid, Creon, MVI, thiamine.  Weight down by 14% within the past month.  NUTRITION - FOCUSED PHYSICAL EXAM:    Most Recent Value  Orbital Region  Mild depletion  Upper Arm Region  Mild depletion  Thoracic and Lumbar Region  Mild depletion  Buccal Region  Mild depletion  Temple Region  No depletion  Clavicle Bone Region  Mild depletion  Clavicle and Acromion Bone Region  No depletion  Scapular Bone Region  Mild depletion  Dorsal Hand  No depletion  Patellar Region  Unable to assess  Anterior Thigh Region  Unable to assess  Posterior Calf Region  Moderate depletion   Edema (RD Assessment)  None  Hair  Reviewed  Eyes  Reviewed  Mouth  Reviewed  Skin  Reviewed  Nails  Reviewed       Diet Order:   Diet Order            Diet regular Room service appropriate? Yes; Fluid consistency: Thin  Diet effective now              EDUCATION NEEDS:   Education needs have been addressed  Skin:  Skin Assessment: Reviewed RN Assessment  Last BM:  9/3  Height:   Ht Readings from Last 1 Encounters:  01/08/18 5\' 9"  (1.753 m)    Weight:   Wt Readings from Last 1 Encounters:  01/09/18 56.4 kg    Ideal Body Weight:  72.7 kg  BMI:  Body mass index is 18.36 kg/m.  Estimated Nutritional Needs:   Kcal:  1900-2100  Protein:  85-95 gm  Fluid:  2 L    Molli Barrows, RD, LDN, [place] Pager [phone] After Hours Pager [phone]

## 2018-01-09 NOTE — Progress Notes (Signed)
  Echocardiogram 2D Echocardiogram has been performed.  [name] 01/09/2018, 2:44 PM

## 2018-01-11 LAB — H. PYLORI ANTIGEN, STOOL: H. PYLORI STOOL AG, EIA: NEGATIVE

## 2018-02-07 ENCOUNTER — Other Ambulatory Visit: Payer: Self-pay

## 2018-02-07 ENCOUNTER — Emergency Department (HOSPITAL_COMMUNITY)
Admission: EM | Admit: 2018-02-07 | Discharge: 2018-02-08 | Disposition: A | Discharge status: Home / Self Care | Payer: Self-pay | Attending: Emergency Medicine | Admitting: Emergency Medicine

## 2018-02-07 ENCOUNTER — Emergency Department (HOSPITAL_COMMUNITY): Payer: Self-pay

## 2018-02-07 ENCOUNTER — Encounter (HOSPITAL_COMMUNITY): Payer: Self-pay | Admitting: Emergency Medicine

## 2018-02-07 DIAGNOSIS — F17210 Nicotine dependence, cigarettes, uncomplicated: Secondary | ICD-10-CM | POA: Insufficient documentation

## 2018-02-07 DIAGNOSIS — F1410 Cocaine abuse, uncomplicated: Secondary | ICD-10-CM | POA: Insufficient documentation

## 2018-02-07 DIAGNOSIS — F1020 Alcohol dependence, uncomplicated: Secondary | ICD-10-CM | POA: Insufficient documentation

## 2018-02-07 DIAGNOSIS — F319 Bipolar disorder, unspecified: Secondary | ICD-10-CM | POA: Insufficient documentation

## 2018-02-07 DIAGNOSIS — D573 Sickle-cell trait: Secondary | ICD-10-CM | POA: Insufficient documentation

## 2018-02-07 DIAGNOSIS — F419 Anxiety disorder, unspecified: Secondary | ICD-10-CM | POA: Insufficient documentation

## 2018-02-07 DIAGNOSIS — Z79899 Other long term (current) drug therapy: Secondary | ICD-10-CM | POA: Insufficient documentation

## 2018-02-07 DIAGNOSIS — J45909 Unspecified asthma, uncomplicated: Secondary | ICD-10-CM | POA: Insufficient documentation

## 2018-02-07 DIAGNOSIS — I10 Essential (primary) hypertension: Secondary | ICD-10-CM | POA: Insufficient documentation

## 2018-02-07 DIAGNOSIS — K860 Alcohol-induced chronic pancreatitis: Secondary | ICD-10-CM | POA: Insufficient documentation

## 2018-02-07 LAB — CBC
HCT: 34.9 % — ABNORMAL LOW (ref 39.0–52.0)
Hemoglobin: 11.4 g/dL — ABNORMAL LOW (ref 13.0–17.0)
MCH: 30.1 pg (ref 26.0–34.0)
MCHC: 32.7 g/dL (ref 30.0–36.0)
MCV: 92.1 fL (ref 78.0–100.0)
PLATELETS: 183 10*3/uL (ref 150–400)
RBC: 3.79 MIL/uL — ABNORMAL LOW (ref 4.22–5.81)
RDW: 19.9 % — AB (ref 11.5–15.5)
WBC: 5.7 10*3/uL (ref 4.0–10.5)

## 2018-02-07 LAB — BASIC METABOLIC PANEL
ANION GAP: 12 (ref 5–15)
BUN: 6 mg/dL (ref 6–20)
CO2: 16 mmol/L — ABNORMAL LOW (ref 22–32)
CREATININE: 0.69 mg/dL (ref 0.61–1.24)
Calcium: 8.4 mg/dL — ABNORMAL LOW (ref 8.9–10.3)
Chloride: 109 mmol/L (ref 98–111)
GFR calc non Af Amer: >60 mL/min (ref >60)
Glucose, Bld: 84 mg/dL (ref 70–99)
Potassium: 4.1 mmol/L (ref 3.5–5.1)
SODIUM: 137 mmol/L (ref 135–145)

## 2018-02-07 LAB — LIPASE, BLOOD: LIPASE: 74 U/L — AB (ref 11–51)

## 2018-02-07 LAB — I-STAT TROPONIN, ED: TROPONIN I, POC: 0 ng/mL (ref 0.00–0.08)

## 2018-02-07 NOTE — ED Notes (Signed)
Update on wait for treatment room.

## 2018-02-07 NOTE — ED Notes (Signed)
Pt notified lobby tech that his IV came out. IV site cleaned and bandaged by Kae Heller EMT.

## 2018-02-07 NOTE — ED Notes (Signed)
Updated on wait for treatment room. 

## 2018-02-07 NOTE — ED Triage Notes (Signed)
Pt presents with CP and abd pain, EMS gave 324mg  ASA and 1 SL NTG without improvement; pt with hx of pancreatitis

## 2018-02-08 LAB — I-STAT TROPONIN, ED: Troponin i, poc: 0 ng/mL (ref 0.00–0.08)

## 2018-02-08 LAB — HEPATIC FUNCTION PANEL
ALT: 93 U/L — AB (ref 0–44)
AST: 182 U/L — ABNORMAL HIGH (ref 15–41)
Albumin: 3.5 g/dL (ref 3.5–5.0)
Alkaline Phosphatase: 105 U/L (ref 38–126)
BILIRUBIN DIRECT: 0.2 mg/dL (ref 0.0–0.2)
Indirect Bilirubin: 0.7 mg/dL (ref 0.3–0.9)
TOTAL PROTEIN: 6.5 g/dL (ref 6.5–8.1)
Total Bilirubin: 0.9 mg/dL (ref 0.3–1.2)

## 2018-02-08 MED ORDER — GI COCKTAIL ~~LOC~~
30.0000 mL | Freq: Once | ORAL | Status: AC
  Administered 2018-02-08: 30 mL via ORAL
  Filled 2018-02-08: qty 30

## 2018-02-08 MED ORDER — KETOROLAC TROMETHAMINE 60 MG/2ML IM SOLN
15.0000 mg | Freq: Once | INTRAMUSCULAR | Status: AC
  Administered 2018-02-08: 15 mg via INTRAMUSCULAR
  Filled 2018-02-08: qty 2

## 2018-02-08 NOTE — ED Provider Notes (Signed)
[facility] EMERGENCY DEPARTMENT Provider Note  CSN: [id number] Arrival date & time: 02/07/18 1829  Chief Complaint(s) Chest Pain and Abdominal Pain  HPI [name] is a 47 y.o. male   The history is provided by the patient.  Abdominal Pain   This is a recurrent problem. The current episode started 6 to 12 hours ago. The problem occurs constantly. The problem has not changed since onset.The pain is associated with alcohol use. The pain is located in the epigastric region (pain radiates up to lower chest). The quality of the pain is aching and sharp. The pain is severe. Associated symptoms include nausea. Pertinent negatives include fever, diarrhea, hematochezia and vomiting. The symptoms are aggravated by palpation and certain positions. Nothing relieves the symptoms. Past medical history comments: daily alcohol use.    Past Medical History Past Medical History:  Diagnosis Date  . Alcoholism /alcohol abuse ([place])   . Anemia   . Anxiety   . Arthritis    "knees; arms; elbows" (03/26/2015)  . Asthma   . Bipolar disorder ([place])   . Chronic bronchitis ([place])   . Chronic lower back pain   . Chronic pancreatitis ([place])   . Cocaine abuse ([place])   . Depression   . Family history of adverse reaction to anesthesia    "grandmother gets confused"  . Femoral condyle fracture ([place]) 03/08/2014   left medial/notes 03/09/2014  . GERD (gastroesophageal reflux disease)   . H/O hiatal hernia   . H/O suicide attempt 10/2012  . Heart murmur    "when he was little" (03/06/2013)  . High cholesterol   . History of blood transfusion 10/2012   "when I tried to commit suicide"  . History of stomach ulcers   . Hypertension   . Marijuana abuse, continuous   . Migraine    "a few times/year" (03/26/2015)  . Pneumonia 1990's X 3  . PTSD (post-traumatic stress disorder)   . Shortness of breath    "can happen at anytime" (03/06/2013)  . Sickle cell trait ([place])   . WPW  (Wolff-Parkinson-White syndrome)    [name] 03/06/2013   Patient Active Problem List   Diagnosis Date Noted  . Gastritis and gastroduodenitis   . Chest pain 01/08/2018  . GI bleed 11/24/2017  . Acute blood loss anemia 11/24/2017  . Atypical chest pain 11/24/2017  . Acute pancreatitis 09/28/2017  . Abdominal pain 05/27/2017  . Hematemesis 05/27/2017  . Tachycardia 03/18/2017  . Diarrhea 03/18/2017  . Acute on chronic pancreatitis ([place]) 12/17/2016  . Intractable nausea and vomiting 12/05/2016  . Verbally abusive behavior 12/05/2016  . Normocytic anemia 12/05/2016  . Alcohol use disorder, severe, dependence ([place]) 07/25/2016  . Cocaine use disorder, severe, dependence ([place]) 07/25/2016  . Major depressive disorder, recurrent severe without psychotic features ([place]) 07/20/2016  . Leukocytosis   . Hospital acquired PNA 05/20/2015  . Chronic pancreatitis ([place]) 05/18/2015  . Pseudocyst of pancreas 05/18/2015  . Polysubstance abuse (tobacco, cocaine, THC, and ETOH) 03/26/2015  . Alcohol-induced chronic pancreatitis ([place])   . Benign essential HTN 02/06/2014  . Alcohol-induced acute pancreatitis 11/28/2013  . Pancreatic pseudocyst/cyst 11/25/2013  . Severe protein-calorie malnutrition ([place]) 10/10/2013  . Suicide attempt ([place]) 10/08/2013  . 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 TOBACCO ABUSE 03/23/2007   Home Medication(s) Prior to Admission medications   Medication Sig Start Date End Date Taking? Authorizing Provider  acetaminophen (TYLENOL) 500 MG tablet Take 1,000 mg by mouth every 6 (six) hours  as needed for mild pain.   Yes [provider]  famotidine (PEPCID) 20 MG tablet Take 20 mg by mouth daily.   Yes [provider]  pantoprazole (PROTONIX) 40 MG tablet Take 1 tablet (40 mg total) by mouth 2 (two) times daily. 9/83/38  Yes Delora Fuel, MD  folic acid (FOLVITE) 1 MG tablet Take 1 tablet (1 mg total) by mouth daily. Patient not  taking: Reported on 02/08/2018 11/26/17   [name], MD  lipase/protease/amylase (CREON) 12000 units CPEP capsule Take 1 capsule (12,000 Units total) by mouth 3 (three) times daily with meals. For pancreatitis Patient not taking: Reported on 02/08/2018 10/01/17   Aline August, MD  loratadine (CLARITIN) 10 MG tablet Take 1 tablet (10 mg total) by mouth daily as needed for allergies or rhinitis. (May purchase from over the counter): For allergies Patient not taking: Reported on 02/08/2018 10/01/17   Aline August, MD  metoprolol tartrate (LOPRESSOR) 25 MG tablet Take 1 tablet (25 mg total) by mouth 2 (two) times daily. For high blood pressure Patient not taking: Reported on 02/08/2018 08/29/17   Clent Demark, PA-C  Multiple Vitamin (MULTIVITAMIN WITH MINERALS) TABS tablet Take 1 tablet by mouth daily. Patient not taking: Reported on 02/08/2018 09/06/17   Dana Allan I, MD  ondansetron (ZOFRAN) 4 MG tablet Take 1 tablet (4 mg total) by mouth every 8 (eight) hours as needed for nausea or vomiting. Patient not taking: Reported on 25/0/5397 6/73/41   Delora Fuel, MD  sertraline (ZOLOFT) 100 MG tablet Take 1 tablet (100 mg total) by mouth daily. Patient not taking: Reported on 02/08/2018 08/29/17   Clent Demark, PA-C  sucralfate (CARAFATE) 1 g tablet Take 1 tablet (1 g total) by mouth 4 (four) times daily. Patient not taking: Reported on 93/11/9022 0/97/35   Delora Fuel, MD  thiamine 100 MG tablet Take 1 tablet (100 mg total) by mouth daily. Patient not taking: Reported on 02/08/2018 11/26/17   [name], MD  omeprazole (PRILOSEC) 20 MG capsule Take 1 capsule (20 mg total) daily by mouth. 03/22/17 06/21/17  Varney Biles, MD                                                                                                                                    Past Surgical History Past Surgical History:  Procedure Laterality Date  . BIOPSY  11/25/2017   Procedure: BIOPSY;  Surgeon:  Arta Silence, MD;  Location: [facility];  Service: Endoscopy;;  . CARDIAC CATHETERIZATION    . ESOPHAGOGASTRODUODENOSCOPY (EGD) WITH PROPOFOL N/A 11/25/2017   Procedure: ESOPHAGOGASTRODUODENOSCOPY (EGD) WITH PROPOFOL;  Surgeon: Arta Silence, MD;  Location: [facility];  Service: Endoscopy;  Laterality: N/A;  . EYE SURGERY Left 1990's   "result of trauma"   . FACIAL FRACTURE SURGERY Left 1990's   "result of trauma"   . FRACTURE SURGERY    . HERNIA REPAIR    .  LEFT HEART CATHETERIZATION WITH CORONARY ANGIOGRAM Right 03/07/2013   Procedure: LEFT HEART CATHETERIZATION WITH CORONARY ANGIOGRAM;  Surgeon: [name], MD;  Location: [facility] CATH LAB;  Service: Cardiovascular;  Laterality: Right;  . UMBILICAL HERNIA REPAIR     Family History Family History  Problem Relation Age of Onset  . Hypertension Mother   . Cirrhosis Mother   . Alcoholism Mother   . Hypertension Father   . Melanoma Father   . Hypertension Other   . Coronary artery disease Other     Social History Social History   Tobacco Use  . Smoking status: Current Every Day Smoker    Packs/day: 1.00    Years: 33.00    Pack years: 33.00    Types: Cigarettes, E-cigarettes  . Smokeless tobacco: Never Used  Substance Use Topics  . Alcohol use: Yes  . Drug use: Yes    Types: Marijuana, Cocaine    Comment: daily marijuana use; last cocaine use about 3 months ago   Allergies Robaxin [methocarbamol]; Shellfish-derived products; Trazodone; Trazodone and nefazodone; Contrast media [iodinated diagnostic agents]; and Reglan [metoclopramide]  Review of Systems Review of Systems  Constitutional: Negative for fever.  Gastrointestinal: Positive for abdominal pain and nausea. Negative for diarrhea, hematochezia and vomiting.   All other systems are reviewed and are negative for acute change except as noted in the HPI  Physical Exam Vital Signs  I have reviewed the triage vital signs BP (!) 135/96 (BP Location: Left Arm)    Pulse 87   Temp 98.3 F (36.8 C) (Oral)   Resp 16   SpO2 99%   Physical Exam  Constitutional: He is oriented to person, place, and time. He appears well-developed and well-nourished. No distress.  HENT:  Head: Normocephalic and atraumatic.  Nose: Nose normal.  Eyes: Pupils are equal, round, and reactive to light. Conjunctivae and EOM are normal. Right eye exhibits no discharge. Left eye exhibits no discharge. No scleral icterus.  Neck: Normal range of motion. Neck supple.  Cardiovascular: Normal rate and regular rhythm. Exam reveals no gallop and no friction rub.  No murmur heard. Pulmonary/Chest: Effort normal and breath sounds normal. No stridor. No respiratory distress. He has no rales.  Abdominal: Soft. He exhibits no distension. There is tenderness in the epigastric area. There is no rigidity, no rebound, no guarding and no CVA tenderness.  Musculoskeletal: He exhibits no edema or tenderness.  Neurological: He is alert and oriented to person, place, and time.  Skin: Skin is warm and dry. No rash noted. He is not diaphoretic. No erythema.  Psychiatric: He has a normal mood and affect.  Vitals reviewed.   ED Results and Treatments Labs (all labs ordered are listed, but only abnormal results are displayed) Labs Reviewed  BASIC METABOLIC PANEL - Abnormal; Notable for the following components:      Result Value   CO2 16 (*)    Calcium 8.4 (*)    All other components within normal limits  CBC - Abnormal; Notable for the following components:   RBC 3.79 (*)    Hemoglobin 11.4 (*)    HCT 34.9 (*)    RDW 19.9 (*)    All other components within normal limits  LIPASE, BLOOD - Abnormal; Notable for the following components:   Lipase 74 (*)    All other components within normal limits  HEPATIC FUNCTION PANEL - Abnormal; Notable for the following components:   AST 182 (*)    ALT 93 (*)  All other components within normal limits  I-STAT TROPONIN, ED  I-STAT TROPONIN, ED                                                                                                                          EKG  EKG Interpretation  Date/Time:  Wednesday February 07 2018 18:38:45 EDT Ventricular Rate:  125 PR Interval:  122 QRS Duration: 66 QT Interval:  316 QTC Calculation: 456 R Axis:   77 Text Interpretation:  Sinus tachycardia Biatrial enlargement Abnormal ECG Otherwise no significant change Confirmed by Addison Lank [phone]) on 02/08/2018 12:26:19 AM      Radiology Dg Chest 2 View  Result Date: 02/07/2018 CLINICAL DATA:  Epigastric pain, shortness of breath, nausea vomiting today EXAM: CHEST - 2 VIEW COMPARISON:  Chest x-ray dated 01/08/2018 FINDINGS: Heart size and mediastinal contours are within normal limits. Chronic bronchitic changes noted centrally. Lungs otherwise clear. No pleural effusion or pneumothorax seen. No acute or suspicious osseous finding. IMPRESSION: 1. No active cardiopulmonary disease. No evidence of pneumonia or pulmonary edema. 2. Chronic bronchitic changes. Electronically Signed   By: Franki Cabot M.D.   On: 02/07/2018 19:36   Pertinent labs & imaging results that were available during my care of the patient were reviewed by me and considered in my medical decision making (see chart for details).  Medications Ordered in ED Medications  ketorolac (TORADOL) injection 15 mg (has no administration in time range)  gi cocktail (Maalox,Lidocaine,Donnatal) (30 mLs Oral Given 02/08/18 0054)                                                                                                                                    Procedures Procedures  (including critical care time)  Medical Decision Making / ED Course I have reviewed the nursing notes for this encounter and the patient's prior records (if available in EHR or on provided paperwork).    Exacerbation of chronic abd pain in the setting of alcohol use. Triage labs w/o leukocytosis. There is evidence  of pancreatitis, but not 3x normal limits. EKG with tachycardia, but HR improved. Will repeat. Trop negative. Unlikely cardiac etiology, but will obtain delta trop.  Chest x-ray without evidence suggestive of pneumonia, pneumothorax, pneumomediastinum.  No abnormal contour of the mediastinum to suggest dissection. No evidence of acute injuries.  Doubt PE, dissection, or esophageal perforation.  No LFTs ordered in  triage, will get due to use of tylenol for pain.  Will provide with Gi cocktail and oral fluids.  LFTs with elevated AST and mildly elevated ALT consistent with alcohol use.  Patient provided with IM Toradol for pain.  Recommended clear diet and follow-up with PCP.  Strict return precautions given.  The patient appears reasonably screened and/or stabilized for discharge and I doubt any other medical condition or other [facility] requiring further screening, evaluation, or treatment in the ED at this time prior to discharge.   Final Clinical Impression(s) / ED Diagnoses Final diagnoses:  Alcohol-induced chronic pancreatitis ([place])   Disposition: Discharge  Condition: Good  I have discussed the results, Dx and Tx plan with the patient who expressed understanding and agree(s) with the plan. Discharge instructions discussed at great length. The patient was given strict return precautions who verbalized understanding of the instructions. No further questions at time of discharge.    ED Discharge Orders    None       Follow Up: Synthia Innocent [name], NP [place] [id number] [phone]  Schedule an appointment as soon as possible for a visit  in 1-2 weeks      This chart was dictated using voice recognition software.  Despite best efforts to proofread,  errors can occur which can change the documentation meaning.   Fatima Blank, MD 02/08/18 [phone]

## 2018-02-08 NOTE — ED Notes (Signed)
Patient verbalizes understanding of medications and discharge instructions. No further questions at this time. VSS and patient ambulatory at discharge.   

## 2018-02-08 NOTE — ED Notes (Signed)
Patient given PO fluids; tolerated well.

## 2018-03-03 ENCOUNTER — Encounter (HOSPITAL_COMMUNITY): Payer: Self-pay | Admitting: Emergency Medicine

## 2018-03-03 ENCOUNTER — Emergency Department (HOSPITAL_COMMUNITY)
Admission: EM | Admit: 2018-03-03 | Discharge: 2018-03-03 | Disposition: A | Discharge status: Home / Self Care | Payer: Self-pay | Attending: Emergency Medicine | Admitting: Emergency Medicine

## 2018-03-03 ENCOUNTER — Emergency Department (HOSPITAL_COMMUNITY): Payer: Self-pay

## 2018-03-03 DIAGNOSIS — K860 Alcohol-induced chronic pancreatitis: Secondary | ICD-10-CM | POA: Insufficient documentation

## 2018-03-03 DIAGNOSIS — R1084 Generalized abdominal pain: Secondary | ICD-10-CM

## 2018-03-03 DIAGNOSIS — K921 Melena: Secondary | ICD-10-CM | POA: Insufficient documentation

## 2018-03-03 DIAGNOSIS — F17290 Nicotine dependence, other tobacco product, uncomplicated: Secondary | ICD-10-CM | POA: Insufficient documentation

## 2018-03-03 DIAGNOSIS — Z79899 Other long term (current) drug therapy: Secondary | ICD-10-CM | POA: Insufficient documentation

## 2018-03-03 DIAGNOSIS — J45909 Unspecified asthma, uncomplicated: Secondary | ICD-10-CM | POA: Insufficient documentation

## 2018-03-03 LAB — HEPATIC FUNCTION PANEL
ALBUMIN: 3.6 g/dL (ref 3.5–5.0)
ALK PHOS: 133 U/L — AB (ref 38–126)
ALT: 72 U/L — AB (ref 0–44)
AST: 112 U/L — AB (ref 15–41)
BILIRUBIN TOTAL: 1.1 mg/dL (ref 0.3–1.2)
Bilirubin, Direct: 0.2 mg/dL (ref 0.0–0.2)
Indirect Bilirubin: 0.9 mg/dL (ref 0.3–0.9)
Total Protein: 6.9 g/dL (ref 6.5–8.1)

## 2018-03-03 LAB — CBC
HCT: 34.3 % — ABNORMAL LOW (ref 39.0–52.0)
HEMATOCRIT: 38.3 % — AB (ref 39.0–52.0)
HEMOGLOBIN: 11.9 g/dL — AB (ref 13.0–17.0)
Hemoglobin: 10.7 g/dL — ABNORMAL LOW (ref 13.0–17.0)
MCH: 32 pg (ref 26.0–34.0)
MCH: 32 pg (ref 26.0–34.0)
MCHC: 31.1 g/dL (ref 30.0–36.0)
MCHC: 31.2 g/dL (ref 30.0–36.0)
MCV: 103 fL — ABNORMAL HIGH (ref 80.0–100.0)
MCV: 102.7 fL — ABNORMAL HIGH (ref 80.0–100.0)
PLATELETS: 182 10*3/uL (ref 150–400)
Platelets: 218 10*3/uL (ref 150–400)
RBC: 3.72 MIL/uL — ABNORMAL LOW (ref 4.22–5.81)
RBC: 3.34 MIL/uL — ABNORMAL LOW (ref 4.22–5.81)
RDW: 17.1 % — ABNORMAL HIGH (ref 11.5–15.5)
RDW: 17.1 % — AB (ref 11.5–15.5)
WBC: 8.7 10*3/uL (ref 4.0–10.5)
WBC: 7.7 10*3/uL (ref 4.0–10.5)
nRBC: 0.2 % (ref 0.0–0.2)
nRBC: 0 % (ref 0.0–0.2)

## 2018-03-03 LAB — BASIC METABOLIC PANEL
Anion gap: 8 (ref 5–15)
CHLORIDE: 107 mmol/L (ref 98–111)
CO2: 18 mmol/L — ABNORMAL LOW (ref 22–32)
CREATININE: 0.75 mg/dL (ref 0.61–1.24)
Calcium: 9.1 mg/dL (ref 8.9–10.3)
GFR calc Af Amer: >60 mL/min (ref >60)
GFR calc non Af Amer: >60 mL/min (ref >60)
Glucose, Bld: 113 mg/dL — ABNORMAL HIGH (ref 70–99)
POTASSIUM: 4.3 mmol/L (ref 3.5–5.1)
Sodium: 133 mmol/L — ABNORMAL LOW (ref 135–145)

## 2018-03-03 LAB — URINALYSIS, ROUTINE W REFLEX MICROSCOPIC
Bilirubin Urine: NEGATIVE
Glucose, UA: NEGATIVE mg/dL
Hgb urine dipstick: NEGATIVE
KETONES UR: NEGATIVE mg/dL
LEUKOCYTES UA: NEGATIVE
NITRITE: NEGATIVE
PH: 5 (ref 5.0–8.0)
Protein, ur: NEGATIVE mg/dL
SPECIFIC GRAVITY, URINE: 1.017 (ref 1.005–1.030)

## 2018-03-03 LAB — I-STAT TROPONIN, ED: Troponin i, poc: 0 ng/mL (ref 0.00–0.08)

## 2018-03-03 LAB — LIPASE, BLOOD: Lipase: 86 U/L — ABNORMAL HIGH (ref 11–51)

## 2018-03-03 LAB — POC OCCULT BLOOD, ED: Fecal Occult Bld: POSITIVE — AB

## 2018-03-03 MED ORDER — FAMOTIDINE IN NACL 20-0.9 MG/50ML-% IV SOLN
20.0000 mg | Freq: Once | INTRAVENOUS | Status: AC
  Administered 2018-03-03: 20 mg via INTRAVENOUS
  Filled 2018-03-03: qty 50

## 2018-03-03 MED ORDER — ALUM & MAG HYDROXIDE-SIMETH 200-200-20 MG/5ML PO SUSP
30.0000 mL | Freq: Once | ORAL | Status: AC
  Administered 2018-03-03: 30 mL via ORAL
  Filled 2018-03-03: qty 30

## 2018-03-03 MED ORDER — ACETAMINOPHEN 500 MG PO TABS
1000.0000 mg | ORAL_TABLET | Freq: Once | ORAL | Status: AC
  Administered 2018-03-03: 1000 mg via ORAL
  Filled 2018-03-03: qty 2

## 2018-03-03 MED ORDER — ONDANSETRON HCL 4 MG/2ML IJ SOLN
4.0000 mg | Freq: Once | INTRAMUSCULAR | Status: AC
  Administered 2018-03-03: 4 mg via INTRAVENOUS
  Filled 2018-03-03: qty 2

## 2018-03-03 MED ORDER — OXYCODONE-ACETAMINOPHEN 5-325 MG PO TABS
1.0000 | ORAL_TABLET | Freq: Once | ORAL | Status: AC
  Administered 2018-03-03: 1 via ORAL
  Filled 2018-03-03: qty 1

## 2018-03-03 MED ORDER — SUCRALFATE 1 G PO TABS: 1.0000 g | ORAL_TABLET | Freq: Four times a day (QID) | ORAL | 0 refills | Status: DC

## 2018-03-03 MED ORDER — LIDOCAINE VISCOUS HCL 2 % MT SOLN
15.0000 mL | Freq: Once | OROMUCOSAL | Status: AC
  Administered 2018-03-03: 15 mL via ORAL
  Filled 2018-03-03: qty 15

## 2018-03-03 MED ORDER — DICYCLOMINE HCL 10 MG/ML IM SOLN
20.0000 mg | Freq: Once | INTRAMUSCULAR | Status: AC
  Administered 2018-03-03: 20 mg via INTRAMUSCULAR
  Filled 2018-03-03: qty 2

## 2018-03-03 MED ORDER — SODIUM CHLORIDE 0.9 % IV BOLUS
1000.0000 mL | Freq: Once | INTRAVENOUS | Status: AC
  Administered 2018-03-03: 1000 mL via INTRAVENOUS

## 2018-03-03 MED ORDER — HALOPERIDOL LACTATE 5 MG/ML IJ SOLN
5.0000 mg | Freq: Once | INTRAMUSCULAR | Status: AC
  Administered 2018-03-03: 5 mg via INTRAVENOUS
  Filled 2018-03-03: qty 1

## 2018-03-03 NOTE — ED Provider Notes (Signed)
[facility] EMERGENCY DEPARTMENT Provider Note   CSN: [id number] Arrival date & time: 03/03/18  1632     History   Chief Complaint Chief Complaint  Patient presents with  . Abdominal Pain  . Chest Pain    HPI [name] is a 47 y.o. male.  [name] is a 47 y.o. Male with a history of chronic abdominal pain and pancreatitis, alcohol abuse, GERD, GI bleeding, hypertension, hyperlipidemia, and frequent visits to the emergency department, who presents to the ED today for evaluation of diffuse abdominal pain radiating up to the chest.  Patient reports the abdominal pain started around 5 AM this morning with associated diarrhea he has had 2 episodes of nausea and vomiting.  Patient reports a few episodes of dark tarry stools over the past few days.  Pain started radiating up into his chest about 45 minutes prior to calling EMS.  He describes it as a burning, sharp and stabbing sensation.  No associated shortness of breath.  No fevers or chills.  No cough.  No active vomiting on arrival.  Patient received 1 sublingual nitro with EMS, no aspirin given.  He reports this feels like his typical abdominal pain which she has been evaluated for numerous times in the emergency department.  Does report drinking alcohol 2 days ago and thinks this is what set off this episode of pain he had been abstaining from alcohol prior to this and had been doing better.  He reports he has been taking his Nexium regularly and tried Tylenol without relief.  He is followed by Dr. Paulita Fujita with GI who did his last endoscopy in July which showed erosive gastritis and a few nonbleeding ulcers and inflammation in the duodenum.     Past Medical History:  Diagnosis Date  . Alcoholism /alcohol abuse ([place])   . Anemia   . Anxiety   . Arthritis    "knees; arms; elbows" (03/26/2015)  . Asthma   . Bipolar disorder ([place])   . Chronic bronchitis ([place])   . Chronic lower back pain   .  Chronic pancreatitis ([place])   . Cocaine abuse ([place])   . Depression   . Family history of adverse reaction to anesthesia    "grandmother gets confused"  . Femoral condyle fracture ([place]) 03/08/2014   left medial/notes 03/09/2014  . GERD (gastroesophageal reflux disease)   . H/O hiatal hernia   . H/O suicide attempt 10/2012  . Heart murmur    "when he was little" (03/06/2013)  . High cholesterol   . History of blood transfusion 10/2012   "when I tried to commit suicide"  . History of stomach ulcers   . Hypertension   . Marijuana abuse, continuous   . Migraine    "a few times/year" (03/26/2015)  . Pneumonia 1990's X 3  . PTSD (post-traumatic stress disorder)   . Shortness of breath    "can happen at anytime" (03/06/2013)  . Sickle cell trait ([place])   . WPW (Wolff-Parkinson-White syndrome)    [name] 03/06/2013    Patient Active Problem List   Diagnosis Date Noted  . Gastritis and gastroduodenitis   . Chest pain 01/08/2018  . GI bleed 11/24/2017  . Acute blood loss anemia 11/24/2017  . Atypical chest pain 11/24/2017  . Acute pancreatitis 09/28/2017  . Abdominal pain 05/27/2017  . Hematemesis 05/27/2017  . Tachycardia 03/18/2017  . Diarrhea 03/18/2017  . Acute on chronic pancreatitis ([place]) 12/17/2016  . Intractable nausea and vomiting 12/05/2016  .  Verbally abusive behavior 12/05/2016  . Normocytic anemia 12/05/2016  . Alcohol use disorder, severe, dependence ([place]) 07/25/2016  . Cocaine use disorder, severe, dependence ([place]) 07/25/2016  . Major depressive disorder, recurrent severe without psychotic features ([place]) 07/20/2016  . Leukocytosis   . Hospital acquired PNA 05/20/2015  . Chronic pancreatitis ([place]) 05/18/2015  . Pseudocyst of pancreas 05/18/2015  . Polysubstance abuse (tobacco, cocaine, THC, and ETOH) 03/26/2015  . Alcohol-induced chronic pancreatitis ([place])   . Benign essential HTN 02/06/2014  . Alcohol-induced acute pancreatitis 11/28/2013  . Pancreatic  pseudocyst/cyst 11/25/2013  . Severe protein-calorie malnutrition ([place]) 10/10/2013  . Suicide attempt ([place]) 10/08/2013  . 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 TOBACCO ABUSE 03/23/2007    Past Surgical History:  Procedure Laterality Date  . BIOPSY  11/25/2017   Procedure: BIOPSY;  Surgeon: Arta Silence, MD;  Location: [facility];  Service: Endoscopy;;  . CARDIAC CATHETERIZATION    . ESOPHAGOGASTRODUODENOSCOPY (EGD) WITH PROPOFOL N/A 11/25/2017   Procedure: ESOPHAGOGASTRODUODENOSCOPY (EGD) WITH PROPOFOL;  Surgeon: Arta Silence, MD;  Location: [facility];  Service: Endoscopy;  Laterality: N/A;  . EYE SURGERY Left 1990's   "result of trauma"   . FACIAL FRACTURE SURGERY Left 1990's   "result of trauma"   . FRACTURE SURGERY    . HERNIA REPAIR    . LEFT HEART CATHETERIZATION WITH CORONARY ANGIOGRAM Right 03/07/2013   Procedure: LEFT HEART CATHETERIZATION WITH CORONARY ANGIOGRAM;  Surgeon: [name], MD;  Location: [facility] CATH LAB;  Service: Cardiovascular;  Laterality: Right;  . UMBILICAL HERNIA REPAIR          Home Medications    Prior to Admission medications   Medication Sig Start Date End Date Taking? Authorizing Provider  acetaminophen (TYLENOL) 500 MG tablet Take 1,000 mg by mouth every 6 (six) hours as needed for mild pain.    [provider]  famotidine (PEPCID) 20 MG tablet Take 20 mg by mouth daily.    [provider]  folic acid (FOLVITE) 1 MG tablet Take 1 tablet (1 mg total) by mouth daily. Patient not taking: Reported on 02/08/2018 11/26/17   [name], MD  lipase/protease/amylase (CREON) 12000 units CPEP capsule Take 1 capsule (12,000 Units total) by mouth 3 (three) times daily with meals. For pancreatitis Patient not taking: Reported on 02/08/2018 10/01/17   Aline August, MD  loratadine (CLARITIN) 10 MG tablet Take 1 tablet (10 mg total) by mouth daily as needed for allergies or rhinitis. (May purchase  from over the counter): For allergies Patient not taking: Reported on 02/08/2018 10/01/17   Aline August, MD  metoprolol tartrate (LOPRESSOR) 25 MG tablet Take 1 tablet (25 mg total) by mouth 2 (two) times daily. For high blood pressure Patient not taking: Reported on 02/08/2018 08/29/17   Clent Demark, PA-C  Multiple Vitamin (MULTIVITAMIN WITH MINERALS) TABS tablet Take 1 tablet by mouth daily. Patient not taking: Reported on 02/08/2018 09/06/17   Dana Allan I, MD  ondansetron (ZOFRAN) 4 MG tablet Take 1 tablet (4 mg total) by mouth every 8 (eight) hours as needed for nausea or vomiting. Patient not taking: Reported on 56/07/8754 4/33/29   Delora Fuel, MD  pantoprazole (PROTONIX) 40 MG tablet Take 1 tablet (40 mg total) by mouth 2 (two) times daily. 09/24/82   Delora Fuel, MD  sertraline (ZOLOFT) 100 MG tablet Take 1 tablet (100 mg total) by mouth daily. Patient not taking: Reported on 02/08/2018 08/29/17   Clent Demark, PA-C  sucralfate (CARAFATE) 1 g tablet Take 1 tablet (1 g total) by mouth 4 (four) times daily. Patient not taking: Reported on 01/15/8337 2/50/53   Delora Fuel, MD  thiamine 100 MG tablet Take 1 tablet (100 mg total) by mouth daily. Patient not taking: Reported on 02/08/2018 11/26/17   [name], MD  omeprazole (PRILOSEC) 20 MG capsule Take 1 capsule (20 mg total) daily by mouth. 03/22/17 06/21/17  Varney Biles, MD    Family History Family History  Problem Relation Age of Onset  . Hypertension Mother   . Cirrhosis Mother   . Alcoholism Mother   . Hypertension Father   . Melanoma Father   . Hypertension Other   . Coronary artery disease Other     Social History Social History   Tobacco Use  . Smoking status: Current Every Day Smoker    Packs/day: 1.00    Years: 33.00    Pack years: 33.00    Types: Cigarettes, E-cigarettes  . Smokeless tobacco: Never Used  Substance Use Topics  . Alcohol use: Yes  . Drug use: Yes    Types: Marijuana,  Cocaine    Comment: daily marijuana use; last cocaine use about 3 months ago     Allergies   Robaxin [methocarbamol]; Shellfish-derived products; Trazodone; Trazodone and nefazodone; Contrast media [iodinated diagnostic agents]; and Reglan [metoclopramide]   Review of Systems Review of Systems  Constitutional: Negative for chills and fever.  HENT: Negative for congestion, rhinorrhea and sore throat.   Eyes: Negative for visual disturbance.  Respiratory: Negative for cough and shortness of breath.   Cardiovascular: Positive for chest pain.  Gastrointestinal: Positive for abdominal pain, blood in stool, diarrhea, nausea and vomiting.  Genitourinary: Negative for dysuria, flank pain and frequency.  Musculoskeletal: Negative for arthralgias and myalgias.  Skin: Negative for color change and rash.  Neurological: Negative for dizziness, syncope and light-headedness.     Physical Exam Updated Vital Signs BP (!) 138/108 (BP Location: Right Arm)   Pulse (!) 128   Temp 98 F (36.7 C) (Oral)   Resp 20   SpO2 100%   Physical Exam  Constitutional: He appears well-developed and well-nourished. No distress.  Patient appears in pain, but is in no acute distress  HENT:  Head: Normocephalic and atraumatic.  Mouth/Throat: Oropharynx is clear and moist.  Eyes: Right eye exhibits no discharge. Left eye exhibits no discharge.  Cardiovascular: Regular rhythm, normal heart sounds and intact distal pulses. Exam reveals no gallop and no friction rub.  No murmur heard. Tachycardic, but regular rhythm  Pulmonary/Chest: Effort normal and breath sounds normal. No respiratory distress.  Respirations equal and unlabored, patient able to speak in full sentences, lungs clear to auscultation bilaterally  Abdominal: Soft. Normal appearance and bowel sounds are normal. He exhibits no distension. There is generalized tenderness.  Tenderness soft, nondistended, bowel sounds are present throughout, the abdomen  is diffusely tender with voluntary guarding throughout, there is no focal rebound tenderness  Neurological: He is alert. Coordination normal.  Skin: He is not diaphoretic.  Psychiatric: He has a normal mood and affect. His behavior is normal.  Nursing note and vitals reviewed.    ED Treatments / Results  Labs (all labs ordered are listed, but only abnormal results are displayed) Labs Reviewed  BASIC METABOLIC PANEL - Abnormal; Notable for the following components:      Result Value   Sodium 133 (*)    CO2 18 (*)    Glucose, Bld 113 (*)  BUN <5 (*)    All other components within normal limits  CBC - Abnormal; Notable for the following components:   RBC 3.72 (*)    Hemoglobin 11.9 (*)    HCT 38.3 (*)    MCV 103.0 (*)    RDW 17.1 (*)    All other components within normal limits  LIPASE, BLOOD - Abnormal; Notable for the following components:   Lipase 86 (*)    All other components within normal limits  HEPATIC FUNCTION PANEL - Abnormal; Notable for the following components:   AST 112 (*)    ALT 72 (*)    Alkaline Phosphatase 133 (*)    All other components within normal limits  CBC - Abnormal; Notable for the following components:   RBC 3.34 (*)    Hemoglobin 10.7 (*)    HCT 34.3 (*)    MCV 102.7 (*)    RDW 17.1 (*)    All other components within normal limits  POC OCCULT BLOOD, ED - Abnormal; Notable for the following components:   Fecal Occult Bld POSITIVE (*)    All other components within normal limits  URINALYSIS, ROUTINE W REFLEX MICROSCOPIC  I-STAT TROPONIN, ED    EKG EKG Interpretation  Date/Time:  Saturday March 03 2018 16:35:51 EDT Ventricular Rate:  138 PR Interval:  58 QRS Duration: 70 QT Interval:  356 QTC Calculation: 539 R Axis:   77 Text Interpretation:  Sinus tachycardia with short PR Nonspecific T wave abnormality Abnormal ECG Since last tracing rate faster Confirmed by Wandra Arthurs [phone]) on 03/03/2018 8:09:53 PM   Radiology Dg Chest  2 View  Result Date: 03/03/2018 CLINICAL DATA:  Pt reports diffuse abdomen and chest pain x 45 mins; pt describes pain as stabbing; pt with hx of pancreatitis; smoker; hx of WPW syndrome, HTN, and PNA EXAM: CHEST - 2 VIEW COMPARISON:  02/07/2018 FINDINGS: The heart size and mediastinal contours are within normal limits. Both lungs are clear. No pleural effusion or pneumothorax. The visualized skeletal structures are unremarkable. IMPRESSION: Normal chest radiographs. Electronically Signed   By: Lajean Manes M.D.   On: 03/03/2018 17:59    Procedures Procedures (including critical care time)  Medications Ordered in ED Medications  sodium chloride 0.9 % bolus 1,000 mL (0 mLs Intravenous Stopped 03/03/18 2041)  ondansetron (ZOFRAN) injection 4 mg (4 mg Intravenous Given 03/03/18 1940)  famotidine (PEPCID) IVPB 20 mg premix (0 mg Intravenous Stopped 03/03/18 2010)  alum & mag hydroxide-simeth (MAALOX/MYLANTA) 200-200-20 MG/5ML suspension 30 mL (30 mLs Oral Given 03/03/18 1940)    And  lidocaine (XYLOCAINE) 2 % viscous mouth solution 15 mL (15 mLs Oral Given 03/03/18 1940)  dicyclomine (BENTYL) injection 20 mg (20 mg Intramuscular Given 03/03/18 1939)  haloperidol lactate (HALDOL) injection 5 mg (5 mg Intravenous Given 03/03/18 2206)  acetaminophen (TYLENOL) tablet 1,000 mg (1,000 mg Oral Given 03/03/18 2208)  oxyCODONE-acetaminophen (PERCOCET/ROXICET) 5-325 MG per tablet 1 tablet (1 tablet Oral Given 03/03/18 2309)     Initial Impression / Assessment and Plan / ED Course  I have reviewed the triage vital signs and the nursing notes.  Pertinent labs & imaging results that were available during my care of the patient were reviewed by me and considered in my medical decision making (see chart for details).  Presents for evaluation of generalized abdominal pain radiating into the chest.  He has been evaluated for abdominal pain numerous times here in the emergency department has history of  chronic abdominal pain  related to alcohol induced pancreatitis, started drinking again 2 days ago and I think this is likely what has started patient's abdominal pain.  He does report some intermittent black tarry stools, does have history of bleeding ulcers.  Patient reports this pain is very similar to the pain he is experienced in the past.  His chest pain seems atypical and more so related to the abdominal pain but will check abdominal labs as well as troponin, EKG and chest x-ray.  Patient is tachycardic on arrival, I feel that this is more than likely due to pain, but will give IV fluids, Bentyl, Zofran, Pepcid and GI cocktail.  Will also check Hemoccult.  EKG without concerning ischemic changes and troponin is negative.  Chest x-ray shows no active cardiopulmonary disease.  CBC shows no leukocytosis, hemoglobin of 11.9, was 11.43 weeks ago, so even if patient is having GI bleeding he is not having a significant drop in his hemoglobin.  No acute electrolyte derangements, normal renal function.  Hepatic function is unchanged from prior visits.  Urinalysis without any signs of infection.  Hemoccult is positive although no significant change in hemoglobin.  Patient continues to complain of pain despite medications will give dose of Haldol, patient has received this multiple times in the past for his refractory abdominal pain.  Case discussed with Dr. Darl Householder given patient's history with positive Hemoccult but no significant change in hemoglobin we will continue to treat symptomatically and will check delta CBC to see if there is any significant change in blood counts, will expect some drop with IV fluids provided.  Patient's vitals have improved and he is no longer tachycardic.  Repeat CBC shows a hemoglobin of 10.7 which is exactly what I would expect after 1 L of fluids, no significant change to suggest active bleeding.  Will give 1 additional dose of pain medication prior to discharge we will have patient  follow-up with Dr. Paulita Fujita I have provided him with a refill of his Carafate and encouraged him to continue with his chronic medications and avoid any alcohol use.  Patient expresses understanding and is in agreement with plan.  He is stable for discharge home at this time.  Final Clinical Impressions(s) / ED Diagnoses   Final diagnoses:  Generalized abdominal pain  Alcohol-induced chronic pancreatitis ([place])  Melena    ED Discharge Orders         Ordered    sucralfate (CARAFATE) 1 g tablet  4 times daily     03/03/18 2306           Jacqlyn Larsen, PA-C 03/04/18 1628    Drenda Freeze, MD 03/04/18 [phone]

## 2018-03-03 NOTE — ED Notes (Signed)
Contacted main lab to add on blood test.

## 2018-03-03 NOTE — Discharge Instructions (Signed)
Please call to schedule follow-up appointment with your GI doctor.  Although you do appear to have a small amount of blood in your stools your hemoglobin is very stable here today, this may be due to ulcers from your gastritis.  Your lipase is slightly elevated today.  Please try and avoid alcohol use as this will worsen your symptoms.  Route continue with your home medications and you can also use Carafate to help with stomach pain.  Return to the emergency department for worsening abdominal pain, fevers, persistent vomiting or any blood in the vomit, worsening blood in your stools or any other new or concerning symptoms.

## 2018-03-03 NOTE — ED Triage Notes (Signed)
Pt presents with [facility] for abd pain and CP x 45 mins; pt describes pain as stabbing; pt with hx of pancreatitis; 18g LAC, EMS gave 1 SL NTG, no ASA given

## 2018-03-13 ENCOUNTER — Emergency Department (HOSPITAL_COMMUNITY)
Admission: EM | Admit: 2018-03-13 | Discharge: 2018-03-13 | Disposition: A | Discharge status: Home / Self Care | Payer: Self-pay | Attending: Emergency Medicine | Admitting: Emergency Medicine

## 2018-03-13 ENCOUNTER — Encounter (HOSPITAL_COMMUNITY): Payer: Self-pay | Admitting: Emergency Medicine

## 2018-03-13 DIAGNOSIS — J45909 Unspecified asthma, uncomplicated: Secondary | ICD-10-CM | POA: Insufficient documentation

## 2018-03-13 DIAGNOSIS — D573 Sickle-cell trait: Secondary | ICD-10-CM | POA: Insufficient documentation

## 2018-03-13 DIAGNOSIS — F319 Bipolar disorder, unspecified: Secondary | ICD-10-CM | POA: Insufficient documentation

## 2018-03-13 DIAGNOSIS — F1210 Cannabis abuse, uncomplicated: Secondary | ICD-10-CM | POA: Insufficient documentation

## 2018-03-13 DIAGNOSIS — R1013 Epigastric pain: Secondary | ICD-10-CM | POA: Insufficient documentation

## 2018-03-13 DIAGNOSIS — F17210 Nicotine dependence, cigarettes, uncomplicated: Secondary | ICD-10-CM | POA: Insufficient documentation

## 2018-03-13 DIAGNOSIS — I456 Pre-excitation syndrome: Secondary | ICD-10-CM | POA: Insufficient documentation

## 2018-03-13 DIAGNOSIS — F1020 Alcohol dependence, uncomplicated: Secondary | ICD-10-CM | POA: Insufficient documentation

## 2018-03-13 DIAGNOSIS — Z79899 Other long term (current) drug therapy: Secondary | ICD-10-CM | POA: Insufficient documentation

## 2018-03-13 DIAGNOSIS — F419 Anxiety disorder, unspecified: Secondary | ICD-10-CM | POA: Insufficient documentation

## 2018-03-13 DIAGNOSIS — F1410 Cocaine abuse, uncomplicated: Secondary | ICD-10-CM | POA: Insufficient documentation

## 2018-03-13 LAB — CBC WITH DIFFERENTIAL/PLATELET
Abs Immature Granulocytes: 0.04 10*3/uL (ref 0.00–0.07)
BASOS PCT: 0 %
Basophils Absolute: 0 10*3/uL (ref 0.0–0.1)
EOS ABS: 0.2 10*3/uL (ref 0.0–0.5)
Eosinophils Relative: 4 %
HCT: 32.7 % — ABNORMAL LOW (ref 39.0–52.0)
Hemoglobin: 10.5 g/dL — ABNORMAL LOW (ref 13.0–17.0)
IMMATURE GRANULOCYTES: 1 %
Lymphocytes Relative: 37 %
Lymphs Abs: 2.3 10*3/uL (ref 0.7–4.0)
MCH: 33 pg (ref 26.0–34.0)
MCHC: 32.1 g/dL (ref 30.0–36.0)
MCV: 102.8 fL — AB (ref 80.0–100.0)
MONOS PCT: 10 %
Monocytes Absolute: 0.6 10*3/uL (ref 0.1–1.0)
NEUTROS PCT: 48 %
Neutro Abs: 3 10*3/uL (ref 1.7–7.7)
PLATELETS: 313 10*3/uL (ref 150–400)
RBC: 3.18 MIL/uL — ABNORMAL LOW (ref 4.22–5.81)
RDW: 18.2 % — ABNORMAL HIGH (ref 11.5–15.5)
WBC: 6.2 10*3/uL (ref 4.0–10.5)
nRBC: 0.5 % — ABNORMAL HIGH (ref 0.0–0.2)

## 2018-03-13 LAB — COMPREHENSIVE METABOLIC PANEL
ALT: 67 U/L — ABNORMAL HIGH (ref 0–44)
AST: 163 U/L — AB (ref 15–41)
Albumin: 3.3 g/dL — ABNORMAL LOW (ref 3.5–5.0)
Alkaline Phosphatase: 96 U/L (ref 38–126)
Anion gap: 8 (ref 5–15)
BUN: 7 mg/dL (ref 6–20)
CALCIUM: 8.6 mg/dL — AB (ref 8.9–10.3)
CHLORIDE: 108 mmol/L (ref 98–111)
CO2: 22 mmol/L (ref 22–32)
CREATININE: 0.7 mg/dL (ref 0.61–1.24)
GLUCOSE: 85 mg/dL (ref 70–99)
POTASSIUM: 4.5 mmol/L (ref 3.5–5.1)
Sodium: 138 mmol/L (ref 135–145)
TOTAL PROTEIN: 6.5 g/dL (ref 6.5–8.1)
Total Bilirubin: 0.7 mg/dL (ref 0.3–1.2)

## 2018-03-13 LAB — LIPASE, BLOOD: Lipase: 49 U/L (ref 11–51)

## 2018-03-13 MED ORDER — ALUM & MAG HYDROXIDE-SIMETH 200-200-20 MG/5ML PO SUSP
30.0000 mL | Freq: Once | ORAL | Status: AC
  Administered 2018-03-13: 30 mL via ORAL
  Filled 2018-03-13: qty 30

## 2018-03-13 MED ORDER — SUCRALFATE 1 G PO TABS: 1.0000 g | ORAL_TABLET | Freq: Four times a day (QID) | ORAL | 0 refills | Status: DC

## 2018-03-13 MED ORDER — DICYCLOMINE HCL 10 MG/5ML PO SOLN
10.0000 mg | Freq: Once | ORAL | Status: AC
  Administered 2018-03-13: 10 mg via ORAL
  Filled 2018-03-13: qty 5

## 2018-03-13 MED ORDER — LIDOCAINE VISCOUS HCL 2 % MT SOLN
15.0000 mL | Freq: Once | OROMUCOSAL | Status: AC
  Administered 2018-03-13: 15 mL via ORAL
  Filled 2018-03-13: qty 15

## 2018-03-13 MED ORDER — KETOROLAC TROMETHAMINE 30 MG/ML IJ SOLN
30.0000 mg | Freq: Once | INTRAMUSCULAR | Status: AC
  Administered 2018-03-13: 30 mg via INTRAVENOUS
  Filled 2018-03-13: qty 1

## 2018-03-13 NOTE — ED Provider Notes (Signed)
[facility] EMERGENCY DEPARTMENT Provider Note   CSN: [id number] Arrival date & time: 03/13/18  1328     History   Chief Complaint Chief Complaint  Patient presents with  . Pain    HPI [name] is a 47 y.o. male.  Pt presents to the ED today with epigastric abdominal pain that he thinks is his pancreatitis and his ulcers.  He has a hx of alcohol abuse, and said he's been sober for 21 days.  He's been going to [employer] every day.  He is out of his carafate.  He called EMS who gave him 50 mcg fentanyl IV.  He was given maalox in triage.     Past Medical History:  Diagnosis Date  . Alcoholism /alcohol abuse ([place])   . Anemia   . Anxiety   . Arthritis    "knees; arms; elbows" (03/26/2015)  . Asthma   . Bipolar disorder ([place])   . Chronic bronchitis ([place])   . Chronic lower back pain   . Chronic pancreatitis ([place])   . Cocaine abuse ([place])   . Depression   . Family history of adverse reaction to anesthesia    "grandmother gets confused"  . Femoral condyle fracture ([place]) 03/08/2014   left medial/notes 03/09/2014  . GERD (gastroesophageal reflux disease)   . H/O hiatal hernia   . H/O suicide attempt 10/2012  . Heart murmur    "when he was little" (03/06/2013)  . High cholesterol   . History of blood transfusion 10/2012   "when I tried to commit suicide"  . History of stomach ulcers   . Hypertension   . Marijuana abuse, continuous   . Migraine    "a few times/year" (03/26/2015)  . Pneumonia 1990's X 3  . PTSD (post-traumatic stress disorder)   . Shortness of breath    "can happen at anytime" (03/06/2013)  . Sickle cell trait ([place])   . WPW (Wolff-Parkinson-White syndrome)    [name] 03/06/2013    Patient Active Problem List   Diagnosis Date Noted  . Gastritis and gastroduodenitis   . Chest pain 01/08/2018  . GI bleed 11/24/2017  . Acute blood loss anemia 11/24/2017  . Atypical chest pain 11/24/2017  . Acute pancreatitis  09/28/2017  . Abdominal pain 05/27/2017  . Hematemesis 05/27/2017  . Tachycardia 03/18/2017  . Diarrhea 03/18/2017  . Acute on chronic pancreatitis ([place]) 12/17/2016  . Intractable nausea and vomiting 12/05/2016  . Verbally abusive behavior 12/05/2016  . Normocytic anemia 12/05/2016  . Alcohol use disorder, severe, dependence ([place]) 07/25/2016  . Cocaine use disorder, severe, dependence ([place]) 07/25/2016  . 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 Leukocytosis   . Hospital acquired PNA 05/20/2015  . Chronic pancreatitis ([place]) 05/18/2015  . Pseudocyst of pancreas 05/18/2015  . Polysubstance abuse (tobacco, cocaine, THC, and ETOH) 03/26/2015  . Alcohol-induced chronic pancreatitis ([place])   . Benign essential HTN 02/06/2014  . Alcohol-induced acute pancreatitis 11/28/2013  . Pancreatic pseudocyst/cyst 11/25/2013  . Severe protein-calorie malnutrition ([place]) 10/10/2013  . Suicide attempt ([place]) 10/08/2013  . 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 TOBACCO ABUSE 03/23/2007    Past Surgical History:  Procedure Laterality Date  . BIOPSY  11/25/2017   Procedure: BIOPSY;  Surgeon: Arta Silence, MD;  Location: [facility];  Service: Endoscopy;;  . CARDIAC CATHETERIZATION    . ESOPHAGOGASTRODUODENOSCOPY (EGD) WITH PROPOFOL N/A 11/25/2017   Procedure: ESOPHAGOGASTRODUODENOSCOPY (EGD) WITH PROPOFOL;  Surgeon: Paulita Fujita,  Gwyndolyn Saxon, MD;  Location: [facility] ENDOSCOPY;  Service: Endoscopy;  Laterality: N/A;  . EYE SURGERY Left 1990's   "result of trauma"   . FACIAL FRACTURE SURGERY Left 1990's   "result of trauma"   . FRACTURE SURGERY    . HERNIA REPAIR    . LEFT HEART CATHETERIZATION WITH CORONARY ANGIOGRAM Right 03/07/2013   Procedure: LEFT HEART CATHETERIZATION WITH CORONARY ANGIOGRAM;  Surgeon: [name], MD;  Location: [facility] CATH LAB;  Service: Cardiovascular;  Laterality: Right;  . UMBILICAL HERNIA REPAIR           Home Medications    Prior to Admission medications   Medication Sig Start Date End Date Taking? Authorizing Provider  acetaminophen (TYLENOL) 500 MG tablet Take 1,000 mg by mouth every 6 (six) hours as needed for mild pain.   Yes [provider]  famotidine (PEPCID) 20 MG tablet Take 20 mg by mouth daily.   Yes [provider]  folic acid (FOLVITE) 1 MG tablet Take 1 tablet (1 mg total) by mouth daily. 11/26/17  Yes [name], MD  lipase/protease/amylase (CREON) 12000 units CPEP capsule Take 1 capsule (12,000 Units total) by mouth 3 (three) times daily with meals. For pancreatitis 10/01/17  Yes Aline August, MD  loratadine (CLARITIN) 10 MG tablet Take 1 tablet (10 mg total) by mouth daily as needed for allergies or rhinitis. (May purchase from over the counter): For allergies 10/01/17  Yes Aline August, MD  metoprolol tartrate (LOPRESSOR) 25 MG tablet Take 1 tablet (25 mg total) by mouth 2 (two) times daily. For high blood pressure 08/29/17  Yes Clent Demark, PA-C  Multiple Vitamin (MULTIVITAMIN WITH MINERALS) TABS tablet Take 1 tablet by mouth daily. 09/06/17  Yes Bonnell Public, MD  ondansetron (ZOFRAN) 4 MG tablet Take 1 tablet (4 mg total) by mouth every 8 (eight) hours as needed for nausea or vomiting. 08/16/79  Yes Delora Fuel, MD  pantoprazole (PROTONIX) 40 MG tablet Take 1 tablet (40 mg total) by mouth 2 (two) times daily. 1/91/47  Yes Delora Fuel, MD  QUEtiapine (SEROQUEL) 100 MG tablet Take 100 mg by mouth at bedtime.   Yes [provider]  sertraline (ZOLOFT) 100 MG tablet Take 1 tablet (100 mg total) by mouth daily. Patient taking differently: Take 150 mg by mouth daily.  08/29/17  Yes Clent Demark, PA-C  thiamine 100 MG tablet Take 1 tablet (100 mg total) by mouth daily. 11/26/17  Yes [name], MD  sucralfate (CARAFATE) 1 g tablet Take 1 tablet (1 g total) by mouth 4 (four) times daily. 03/13/18   Isla Pence, MD   omeprazole (PRILOSEC) 20 MG capsule Take 1 capsule (20 mg total) daily by mouth. 03/22/17 06/21/17  Varney Biles, MD    Family History Family History  Problem Relation Age of Onset  . Hypertension Mother   . Cirrhosis Mother   . Alcoholism Mother   . Hypertension Father   . Melanoma Father   . Hypertension Other   . Coronary artery disease Other     Social History Social History   Tobacco Use  . Smoking status: Current Every Day Smoker    Packs/day: 1.00    Years: 33.00    Pack years: 33.00    Types: Cigarettes, E-cigarettes  . Smokeless tobacco: Never Used  Substance Use Topics  . Alcohol use: Yes  . Drug use: Yes    Types: Marijuana, Cocaine    Comment: daily marijuana use; last cocaine  use about 3 months ago     Allergies   Robaxin [methocarbamol]; Shellfish-derived products; Trazodone; Trazodone and nefazodone; Contrast media [iodinated diagnostic agents]; and Reglan [metoclopramide]   Review of Systems Review of Systems  Gastrointestinal: Positive for abdominal pain.  All other systems reviewed and are negative.    Physical Exam Updated Vital Signs BP 111/77   Pulse 81   Temp 98.2 F (36.8 C) (Oral)   Resp 18   Ht 5\' 8"  (1.727 m)   Wt 52.2 kg   SpO2 100%   BMI 17.49 kg/m   Physical Exam  Constitutional: He is oriented to person, place, and time. He appears well-developed and well-nourished.  HENT:  Head: Normocephalic and atraumatic.  Right Ear: External ear normal.  Left Ear: External ear normal.  Nose: Nose normal.  Mouth/Throat: Oropharynx is clear and moist.  Eyes: Pupils are equal, round, and reactive to light. Conjunctivae and EOM are normal.  Neck: Normal range of motion. Neck supple.  Cardiovascular: Normal rate, regular rhythm, normal heart sounds and intact distal pulses.  Pulmonary/Chest: Effort normal and breath sounds normal.  Abdominal: Soft. Bowel sounds are normal. There is tenderness in the epigastric area.   Musculoskeletal: Normal range of motion.  Neurological: He is alert and oriented to person, place, and time.  Skin: Skin is warm. Capillary refill takes less than 2 seconds.  Psychiatric: He has a normal mood and affect. His behavior is normal. Judgment and thought content normal.  Nursing note and vitals reviewed.    ED Treatments / Results  Labs (all labs ordered are listed, but only abnormal results are displayed) Labs Reviewed  CBC WITH DIFFERENTIAL/PLATELET - Abnormal; Notable for the following components:      Result Value   RBC 3.18 (*)    Hemoglobin 10.5 (*)    HCT 32.7 (*)    MCV 102.8 (*)    RDW 18.2 (*)    nRBC 0.5 (*)    All other components within normal limits  COMPREHENSIVE METABOLIC PANEL - Abnormal; Notable for the following components:   Calcium 8.6 (*)    Albumin 3.3 (*)    AST 163 (*)    ALT 67 (*)    All other components within normal limits  LIPASE, BLOOD    EKG None  Radiology No results found.  Procedures Procedures (including critical care time)  Medications Ordered in ED Medications  alum & mag hydroxide-simeth (MAALOX/MYLANTA) 200-200-20 MG/5ML suspension 30 mL (has no administration in time range)    And  lidocaine (XYLOCAINE) 2 % viscous mouth solution 15 mL (has no administration in time range)  dicyclomine (BENTYL) 10 MG/5ML syrup 10 mg (10 mg Oral Given 03/13/18 1443)  alum & mag hydroxide-simeth (MAALOX/MYLANTA) 200-200-20 MG/5ML suspension 30 mL (30 mLs Oral Given 03/13/18 1443)  lidocaine (XYLOCAINE) 2 % viscous mouth solution 15 mL (15 mLs Oral Given 03/13/18 1740)  ketorolac (TORADOL) 30 MG/ML injection 30 mg (30 mg Intravenous Given 03/13/18 1741)     Initial Impression / Assessment and Plan / ED Course  I have reviewed the triage vital signs and the nursing notes.  Pertinent labs & imaging results that were available during my care of the patient were reviewed by me and considered in my medical decision making (see chart for  details).    Pt has a care plan as he has been here many times.  The pt will not be given narcotics.  He is feeling much better.  He is stable for  d/c.  He is encouraged to continue to abstain from alcohol.  Final Clinical Impressions(s) / ED Diagnoses   Final diagnoses:  Epigastric pain    ED Discharge Orders         Ordered    sucralfate (CARAFATE) 1 g tablet  4 times daily     03/13/18 1916           Isla Pence, MD 03/13/18 [phone]

## 2018-03-13 NOTE — ED Provider Notes (Signed)
Patient placed in Quick Look pathway, seen and evaluated   Chief Complaint: abdominal pain  HPI: [name] is a 47 y.o. male with hx of pancreatitis and frequent visits to the ED here today with abdominal pain. Patient requesting GI cocktail.   ROS: GI: abdominal pain  Physical Exam:  BP 105/82 (BP Location: Left Arm)   Pulse 76   Temp 98.2 F (36.8 C) (Oral)   Resp 18   Ht 5\' 8"  (1.727 m)   Wt 52.2 kg   SpO2 100%   BMI 17.49 kg/m    Gen: No distress, appears uncomfortable, bent forward and rocking back and forth.  Neuro: Awake and Alert  Skin: Warm and dry  Abdomen: tender with palpation RUQ   Initiation of care has begun. The patient has been counseled on the process, plan, and necessity for staying for the completion/evaluation, and the remainder of the medical screening examination    Ashley Murrain, NP 03/13/18 [place]    Dorie Rank, MD 03/13/18 1616

## 2018-03-13 NOTE — ED Triage Notes (Signed)
Pt arrives for reevaluation for pancreatitis type abd pain. 20G IV placed by ems- given 86mcg fentanyl  en route.

## 2018-03-15 ENCOUNTER — Other Ambulatory Visit: Payer: Self-pay

## 2018-03-15 ENCOUNTER — Encounter (HOSPITAL_COMMUNITY): Payer: Self-pay | Admitting: Emergency Medicine

## 2018-03-15 ENCOUNTER — Emergency Department (HOSPITAL_COMMUNITY)
Admission: EM | Admit: 2018-03-15 | Discharge: 2018-03-15 | Disposition: A | Discharge status: Home / Self Care | Payer: Self-pay | Attending: Emergency Medicine | Admitting: Emergency Medicine

## 2018-03-15 DIAGNOSIS — Z79899 Other long term (current) drug therapy: Secondary | ICD-10-CM | POA: Insufficient documentation

## 2018-03-15 DIAGNOSIS — R1012 Left upper quadrant pain: Secondary | ICD-10-CM | POA: Insufficient documentation

## 2018-03-15 DIAGNOSIS — R109 Unspecified abdominal pain: Secondary | ICD-10-CM

## 2018-03-15 DIAGNOSIS — J45909 Unspecified asthma, uncomplicated: Secondary | ICD-10-CM | POA: Insufficient documentation

## 2018-03-15 DIAGNOSIS — I10 Essential (primary) hypertension: Secondary | ICD-10-CM | POA: Insufficient documentation

## 2018-03-15 DIAGNOSIS — F17210 Nicotine dependence, cigarettes, uncomplicated: Secondary | ICD-10-CM | POA: Insufficient documentation

## 2018-03-15 DIAGNOSIS — G8929 Other chronic pain: Secondary | ICD-10-CM

## 2018-03-15 DIAGNOSIS — D649 Anemia, unspecified: Secondary | ICD-10-CM | POA: Insufficient documentation

## 2018-03-15 LAB — COMPREHENSIVE METABOLIC PANEL
ALT: 68 U/L — ABNORMAL HIGH (ref 0–44)
AST: 89 U/L — ABNORMAL HIGH (ref 15–41)
Albumin: 3.3 g/dL — ABNORMAL LOW (ref 3.5–5.0)
Alkaline Phosphatase: 93 U/L (ref 38–126)
Anion gap: 10 (ref 5–15)
BUN: <5 mg/dL — ABNORMAL LOW (ref 6–20)
CO2: 17 mmol/L — ABNORMAL LOW (ref 22–32)
Calcium: 8.5 mg/dL — ABNORMAL LOW (ref 8.9–10.3)
Chloride: 108 mmol/L (ref 98–111)
Creatinine, Ser: 0.63 mg/dL (ref 0.61–1.24)
GFR calc Af Amer: >60 mL/min (ref >60)
GFR calc non Af Amer: >60 mL/min (ref >60)
Glucose, Bld: 104 mg/dL — ABNORMAL HIGH (ref 70–99)
Potassium: 4.1 mmol/L (ref 3.5–5.1)
Sodium: 135 mmol/L (ref 135–145)
Total Bilirubin: 0.7 mg/dL (ref 0.3–1.2)
Total Protein: 6.3 g/dL — ABNORMAL LOW (ref 6.5–8.1)

## 2018-03-15 LAB — CBC WITH DIFFERENTIAL/PLATELET
Abs Immature Granulocytes: 0.03 10*3/uL (ref 0.00–0.07)
Basophils Absolute: 0 10*3/uL (ref 0.0–0.1)
Basophils Relative: 0 %
Eosinophils Absolute: 0.1 10*3/uL (ref 0.0–0.5)
Eosinophils Relative: 2 %
HCT: 30.1 % — ABNORMAL LOW (ref 39.0–52.0)
Hemoglobin: 9.7 g/dL — ABNORMAL LOW (ref 13.0–17.0)
Immature Granulocytes: 0 %
Lymphocytes Relative: 35 %
Lymphs Abs: 2.3 10*3/uL (ref 0.7–4.0)
MCH: 32.7 pg (ref 26.0–34.0)
MCHC: 32.2 g/dL (ref 30.0–36.0)
MCV: 101.3 fL — ABNORMAL HIGH (ref 80.0–100.0)
Monocytes Absolute: 0.9 10*3/uL (ref 0.1–1.0)
Monocytes Relative: 13 %
Neutro Abs: 3.4 10*3/uL (ref 1.7–7.7)
Neutrophils Relative %: 50 %
Platelets: 291 10*3/uL (ref 150–400)
RBC: 2.97 MIL/uL — ABNORMAL LOW (ref 4.22–5.81)
RDW: 17.6 % — ABNORMAL HIGH (ref 11.5–15.5)
WBC: 6.8 10*3/uL (ref 4.0–10.5)
nRBC: 0 % (ref 0.0–0.2)

## 2018-03-15 LAB — LIPASE, BLOOD: Lipase: 49 U/L (ref 11–51)

## 2018-03-15 MED ORDER — KETOROLAC TROMETHAMINE 30 MG/ML IJ SOLN
30.0000 mg | Freq: Once | INTRAMUSCULAR | Status: AC
  Administered 2018-03-15: 30 mg via INTRAMUSCULAR
  Filled 2018-03-15: qty 1

## 2018-03-15 MED ORDER — LIDOCAINE VISCOUS HCL 2 % MT SOLN
15.0000 mL | Freq: Once | OROMUCOSAL | Status: AC
  Administered 2018-03-15: 15 mL via ORAL
  Filled 2018-03-15: qty 15

## 2018-03-15 MED ORDER — ALUM & MAG HYDROXIDE-SIMETH 200-200-20 MG/5ML PO SUSP
30.0000 mL | Freq: Once | ORAL | Status: AC
  Administered 2018-03-15: 30 mL via ORAL
  Filled 2018-03-15: qty 30

## 2018-03-15 NOTE — ED Provider Notes (Signed)
[facility] EMERGENCY DEPARTMENT Provider Note   CSN: [id number] Arrival date & time: 03/15/18  1217     History   Chief Complaint Chief Complaint  Patient presents with  . Abdominal Pain  . Chest Pain    HPI [name] is a 47 y.o. male.  HPI   47 year old male presents today with complaints of abdominal pain.  Patient notes symptoms started this morning with sharp pain in his left upper quadrant.  He notes this is typical of previous pancreatic pain.  He notes 2 episodes of vomiting, denies any fever, denies any lower abdominal pain.  Patient notes he has not drank a call in the last 2 weeks.  Patient also notes a sharp stabbing pain radiating up from his left upper quadrant into his chest, typical of previous chest pain.  He notes this started this morning along with the abdominal pain.  Patient was able to tolerate skittles prior to my evaluation.   Past Medical History:  Diagnosis Date  . Alcoholism /alcohol abuse ([place])   . Anemia   . Anxiety   . Arthritis    "knees; arms; elbows" (03/26/2015)  . Asthma   . Bipolar disorder ([place])   . Chronic bronchitis ([place])   . Chronic lower back pain   . Chronic pancreatitis ([place])   . Cocaine abuse ([place])   . Depression   . Family history of adverse reaction to anesthesia    "grandmother gets confused"  . Femoral condyle fracture ([place]) 03/08/2014   left medial/notes 03/09/2014  . GERD (gastroesophageal reflux disease)   . H/O hiatal hernia   . H/O suicide attempt 10/2012  . Heart murmur    "when he was little" (03/06/2013)  . High cholesterol   . History of blood transfusion 10/2012   "when I tried to commit suicide"  . History of stomach ulcers   . Hypertension   . Marijuana abuse, continuous   . Migraine    "a few times/year" (03/26/2015)  . Pneumonia 1990's X 3  . PTSD (post-traumatic stress disorder)   . Shortness of breath    "can happen at anytime" (03/06/2013)  . Sickle cell trait  ([place])   . WPW (Wolff-Parkinson-White syndrome)    [name] 03/06/2013    Patient Active Problem List   Diagnosis Date Noted  . Gastritis and gastroduodenitis   . Chest pain 01/08/2018  . GI bleed 11/24/2017  . Acute blood loss anemia 11/24/2017  . Atypical chest pain 11/24/2017  . Acute pancreatitis 09/28/2017  . Abdominal pain 05/27/2017  . Hematemesis 05/27/2017  . Tachycardia 03/18/2017  . Diarrhea 03/18/2017  . Acute on chronic pancreatitis ([place]) 12/17/2016  . Intractable nausea and vomiting 12/05/2016  . Verbally abusive behavior 12/05/2016  . Normocytic anemia 12/05/2016  . Alcohol use disorder, severe, dependence ([place]) 07/25/2016  . Cocaine use disorder, severe, dependence ([place]) 07/25/2016  . 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 Leukocytosis   . Hospital acquired PNA 05/20/2015  . Chronic pancreatitis ([place]) 05/18/2015  . Pseudocyst of pancreas 05/18/2015  . Polysubstance abuse (tobacco, cocaine, THC, and ETOH) 03/26/2015  . Alcohol-induced chronic pancreatitis ([place])   . Benign essential HTN 02/06/2014  . Alcohol-induced acute pancreatitis 11/28/2013  . Pancreatic pseudocyst/cyst 11/25/2013  . Severe protein-calorie malnutrition ([place]) 10/10/2013  . Suicide attempt ([place]) 10/08/2013  . 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 TOBACCO ABUSE 03/23/2007    Past Surgical History:  Procedure Laterality  Date  . BIOPSY  11/25/2017   Procedure: BIOPSY;  Surgeon: Arta Silence, MD;  Location: [facility];  Service: Endoscopy;;  . CARDIAC CATHETERIZATION    . ESOPHAGOGASTRODUODENOSCOPY (EGD) WITH PROPOFOL N/A 11/25/2017   Procedure: ESOPHAGOGASTRODUODENOSCOPY (EGD) WITH PROPOFOL;  Surgeon: Arta Silence, MD;  Location: [facility];  Service: Endoscopy;  Laterality: N/A;  . EYE SURGERY Left 1990's   "result of trauma"   . FACIAL FRACTURE SURGERY Left 1990's   "result of trauma"   . FRACTURE  SURGERY    . HERNIA REPAIR    . LEFT HEART CATHETERIZATION WITH CORONARY ANGIOGRAM Right 03/07/2013   Procedure: LEFT HEART CATHETERIZATION WITH CORONARY ANGIOGRAM;  Surgeon: [name], MD;  Location: [facility] CATH LAB;  Service: Cardiovascular;  Laterality: Right;  . UMBILICAL HERNIA REPAIR          Home Medications    Prior to Admission medications   Medication Sig Start Date End Date Taking? Authorizing Provider  acetaminophen (TYLENOL) 500 MG tablet Take 1,000 mg by mouth every 6 (six) hours as needed for mild pain.    [provider]  famotidine (PEPCID) 20 MG tablet Take 20 mg by mouth daily.    [provider]  folic acid (FOLVITE) 1 MG tablet Take 1 tablet (1 mg total) by mouth daily. 11/26/17   [name], MD  lipase/protease/amylase (CREON) 12000 units CPEP capsule Take 1 capsule (12,000 Units total) by mouth 3 (three) times daily with meals. For pancreatitis 10/01/17   Aline August, MD  loratadine (CLARITIN) 10 MG tablet Take 1 tablet (10 mg total) by mouth daily as needed for allergies or rhinitis. (May purchase from over the counter): For allergies 10/01/17   Aline August, MD  metoprolol tartrate (LOPRESSOR) 25 MG tablet Take 1 tablet (25 mg total) by mouth 2 (two) times daily. For high blood pressure 08/29/17   Clent Demark, PA-C  Multiple Vitamin (MULTIVITAMIN WITH MINERALS) TABS tablet Take 1 tablet by mouth daily. 09/06/17   Bonnell Public, MD  ondansetron (ZOFRAN) 4 MG tablet Take 1 tablet (4 mg total) by mouth every 8 (eight) hours as needed for nausea or vomiting. 4/76/54   Delora Fuel, MD  pantoprazole (PROTONIX) 40 MG tablet Take 1 tablet (40 mg total) by mouth 2 (two) times daily. 6/50/35   Delora Fuel, MD  QUEtiapine (SEROQUEL) 100 MG tablet Take 100 mg by mouth at bedtime.    [provider]  sertraline (ZOLOFT) 100 MG tablet Take 1 tablet (100 mg total) by mouth daily. Patient taking differently: Take 150 mg by mouth  daily.  08/29/17   Clent Demark, PA-C  sucralfate (CARAFATE) 1 g tablet Take 1 tablet (1 g total) by mouth 4 (four) times daily. 03/13/18   Isla Pence, MD  thiamine 100 MG tablet Take 1 tablet (100 mg total) by mouth daily. 11/26/17   [name], MD  omeprazole (PRILOSEC) 20 MG capsule Take 1 capsule (20 mg total) daily by mouth. 03/22/17 06/21/17  Varney Biles, MD    Family History Family History  Problem Relation Age of Onset  . Hypertension Mother   . Cirrhosis Mother   . Alcoholism Mother   . Hypertension Father   . Melanoma Father   . Hypertension Other   . Coronary artery disease Other     Social History Social History   Tobacco Use  . Smoking status: Current Every Day Smoker    Packs/day: 1.00    Years: 33.00  Pack years: 33.00    Types: Cigarettes, E-cigarettes  . Smokeless tobacco: Never Used  Substance Use Topics  . Alcohol use: Yes  . Drug use: Yes    Types: Marijuana, Cocaine    Comment: daily marijuana use; last cocaine use about 3 months ago     Allergies   Robaxin [methocarbamol]; Shellfish-derived products; Trazodone; Trazodone and nefazodone; Contrast media [iodinated diagnostic agents]; and Reglan [metoclopramide]   Review of Systems Review of Systems  All other systems reviewed and are negative.    Physical Exam Updated Vital Signs BP (!) 133/106 (BP Location: Right Arm)   Pulse 91   Temp 99.4 F (37.4 C) (Oral)   Resp 20   Ht 5\' 9"  (1.753 m)   Wt 52.2 kg   SpO2 100%   BMI 16.98 kg/m   Physical Exam  Constitutional: He is oriented to person, place, and time. He appears well-developed and well-nourished.  HENT:  Head: Normocephalic and atraumatic.  Eyes: Pupils are equal, round, and reactive to light. Conjunctivae are normal. Right eye exhibits no discharge. Left eye exhibits no discharge. No scleral icterus.  Neck: Normal range of motion. No JVD present. No tracheal deviation present.  Cardiovascular: Normal rate,  regular rhythm and normal heart sounds.  Pulmonary/Chest: Effort normal. No stridor. No respiratory distress. He has no wheezes. He has no rales.  Abdominal:  Tenderness palpation of left upper quadrant and epigastric region  Neurological: He is alert and oriented to person, place, and time. Coordination normal.  Psychiatric: He has a normal mood and affect. His behavior is normal. Judgment and thought content normal.  Nursing note and vitals reviewed.    ED Treatments / Results  Labs (all labs ordered are listed, but only abnormal results are displayed) Labs Reviewed  CBC WITH DIFFERENTIAL/PLATELET - Abnormal; Notable for the following components:      Result Value   RBC 2.97 (*)    Hemoglobin 9.7 (*)    HCT 30.1 (*)    MCV 101.3 (*)    RDW 17.6 (*)    All other components within normal limits  COMPREHENSIVE METABOLIC PANEL - Abnormal; Notable for the following components:   CO2 17 (*)    Glucose, Bld 104 (*)    BUN <5 (*)    Calcium 8.5 (*)    Total Protein 6.3 (*)    Albumin 3.3 (*)    AST 89 (*)    ALT 68 (*)    All other components within normal limits  LIPASE, BLOOD    EKG None  Radiology No results found.  Procedures Procedures (including critical care time)  Medications Ordered in ED Medications  ketorolac (TORADOL) 30 MG/ML injection 30 mg (30 mg Intramuscular Given 03/15/18 1258)  alum & mag hydroxide-simeth (MAALOX/MYLANTA) 200-200-20 MG/5ML suspension 30 mL (30 mLs Oral Given 03/15/18 1258)    And  lidocaine (XYLOCAINE) 2 % viscous mouth solution 15 mL (15 mLs Oral Given 03/15/18 1258)     Initial Impression / Assessment and Plan / ED Course  I have reviewed the triage vital signs and the nursing notes.  Pertinent labs & imaging results that were available during my care of the patient were reviewed by me and considered in my medical decision making (see chart for details).     Labs: CBC, CMP, Lipase  Imaging:  Consults:  Therapeutics:    Discharge Meds:   Assessment/Plan: Patient's labs reassuring.  He does have a slight drop in his hemoglobin at 9.7.  No indication for acute management at this time, he will follow-up as an outpatient, strict return precautions given, he verbalized understanding and agreement to today's plan had no further questions or concerns.  Patient does have Carafate at home which he reports improved his symptoms.    Final Clinical Impressions(s) / ED Diagnoses   Final diagnoses:  Anemia, unspecified type  Chronic abdominal pain    ED Discharge Orders    None       Okey Regal, PA-C 03/15/18 1407    Daleen Bo, MD 03/16/18 1357

## 2018-03-15 NOTE — Discharge Instructions (Addendum)
Please read attached information. If you experience any new or worsening signs or symptoms please return to the emergency room for evaluation. Please follow-up with your primary care provider or specialist as discussed. Please continue using carafate as needed.

## 2018-03-15 NOTE — ED Triage Notes (Signed)
PT has repeat cases of pancreatitis. PT reports LUQ pain for 2 days with nausea and vomiting. PT reports vomit is brown/black. He has a history of bleeding ulcers. PT reports stool was normal today.   PT reports chest pain for 1 hour.

## 2018-03-16 ENCOUNTER — Other Ambulatory Visit: Payer: Self-pay

## 2018-03-16 ENCOUNTER — Encounter (HOSPITAL_COMMUNITY): Payer: Self-pay | Admitting: *Deleted

## 2018-03-16 ENCOUNTER — Emergency Department (HOSPITAL_COMMUNITY)
Admission: EM | Admit: 2018-03-16 | Discharge: 2018-03-16 | Disposition: A | Discharge status: Home / Self Care | Payer: Self-pay | Attending: Emergency Medicine | Admitting: Emergency Medicine

## 2018-03-16 DIAGNOSIS — R109 Unspecified abdominal pain: Secondary | ICD-10-CM

## 2018-03-16 DIAGNOSIS — I10 Essential (primary) hypertension: Secondary | ICD-10-CM | POA: Insufficient documentation

## 2018-03-16 DIAGNOSIS — Z79899 Other long term (current) drug therapy: Secondary | ICD-10-CM | POA: Insufficient documentation

## 2018-03-16 DIAGNOSIS — J45909 Unspecified asthma, uncomplicated: Secondary | ICD-10-CM | POA: Insufficient documentation

## 2018-03-16 DIAGNOSIS — F17290 Nicotine dependence, other tobacco product, uncomplicated: Secondary | ICD-10-CM | POA: Insufficient documentation

## 2018-03-16 DIAGNOSIS — G8929 Other chronic pain: Secondary | ICD-10-CM | POA: Insufficient documentation

## 2018-03-16 LAB — ETHANOL

## 2018-03-16 LAB — COMPREHENSIVE METABOLIC PANEL
ALK PHOS: 108 U/L (ref 38–126)
ALT: 65 U/L — ABNORMAL HIGH (ref 0–44)
ANION GAP: 12 (ref 5–15)
AST: 75 U/L — ABNORMAL HIGH (ref 15–41)
Albumin: 3.6 g/dL (ref 3.5–5.0)
BUN: 6 mg/dL (ref 6–20)
CALCIUM: 9 mg/dL (ref 8.9–10.3)
CHLORIDE: 104 mmol/L (ref 98–111)
CO2: 19 mmol/L — AB (ref 22–32)
Creatinine, Ser: 0.63 mg/dL (ref 0.61–1.24)
GFR calc non Af Amer: >60 mL/min (ref >60)
Glucose, Bld: 92 mg/dL (ref 70–99)
Potassium: 4.4 mmol/L (ref 3.5–5.1)
SODIUM: 135 mmol/L (ref 135–145)
Total Bilirubin: 0.8 mg/dL (ref 0.3–1.2)
Total Protein: 7.2 g/dL (ref 6.5–8.1)

## 2018-03-16 LAB — CBC WITH DIFFERENTIAL/PLATELET
ABS IMMATURE GRANULOCYTES: 0.04 10*3/uL (ref 0.00–0.07)
Basophils Absolute: 0 10*3/uL (ref 0.0–0.1)
Basophils Relative: 0 %
Eosinophils Absolute: 0.1 10*3/uL (ref 0.0–0.5)
Eosinophils Relative: 1 %
HEMATOCRIT: 32.5 % — AB (ref 39.0–52.0)
HEMOGLOBIN: 11 g/dL — AB (ref 13.0–17.0)
Immature Granulocytes: 1 %
LYMPHS ABS: 2.1 10*3/uL (ref 0.7–4.0)
LYMPHS PCT: 29 %
MCH: 34.8 pg — AB (ref 26.0–34.0)
MCHC: 33.8 g/dL (ref 30.0–36.0)
MCV: 102.8 fL — AB (ref 80.0–100.0)
MONO ABS: 0.8 10*3/uL (ref 0.1–1.0)
Monocytes Relative: 10 %
NEUTROS ABS: 4.3 10*3/uL (ref 1.7–7.7)
Neutrophils Relative %: 59 %
Platelets: 295 10*3/uL (ref 150–400)
RBC: 3.16 MIL/uL — ABNORMAL LOW (ref 4.22–5.81)
RDW: 17.8 % — ABNORMAL HIGH (ref 11.5–15.5)
WBC: 7.3 10*3/uL (ref 4.0–10.5)
nRBC: 0 % (ref 0.0–0.2)

## 2018-03-16 LAB — TYPE AND SCREEN
ABO/RH(D): B POS
ANTIBODY SCREEN: NEGATIVE

## 2018-03-16 LAB — LIPASE, BLOOD: LIPASE: 49 U/L (ref 11–51)

## 2018-03-16 LAB — I-STAT TROPONIN, ED: Troponin i, poc: 0 ng/mL (ref 0.00–0.08)

## 2018-03-16 MED ORDER — ALUM & MAG HYDROXIDE-SIMETH 200-200-20 MG/5ML PO SUSP
30.0000 mL | Freq: Once | ORAL | Status: AC
  Administered 2018-03-16: 30 mL via ORAL
  Filled 2018-03-16: qty 30

## 2018-03-16 MED ORDER — PANTOPRAZOLE SODIUM 40 MG IV SOLR
40.0000 mg | Freq: Once | INTRAVENOUS | Status: AC
  Administered 2018-03-16: 40 mg via INTRAVENOUS
  Filled 2018-03-16: qty 40

## 2018-03-16 MED ORDER — FENTANYL CITRATE (PF) 100 MCG/2ML IJ SOLN
100.0000 ug | Freq: Once | INTRAMUSCULAR | Status: AC
  Administered 2018-03-16: 100 ug via INTRAVENOUS
  Filled 2018-03-16: qty 2

## 2018-03-16 MED ORDER — LIDOCAINE VISCOUS HCL 2 % MT SOLN
15.0000 mL | Freq: Once | OROMUCOSAL | Status: AC
  Administered 2018-03-16: 15 mL via ORAL
  Filled 2018-03-16: qty 15

## 2018-03-16 MED ORDER — SODIUM CHLORIDE 0.9 % IV BOLUS
1000.0000 mL | Freq: Once | INTRAVENOUS | Status: AC
  Administered 2018-03-16: 1000 mL via INTRAVENOUS

## 2018-03-16 NOTE — ED Triage Notes (Signed)
The pt arrived by [facility] from [facility] c/o abd and chest pain  He has a history of alcoholism opamcreatitis  He last has alcohol earlier today

## 2018-03-16 NOTE — Discharge Instructions (Signed)
Please read and follow all provided instructions.  Your diagnoses today include:  1. Chronic abdominal pain     Tests performed today include:  Blood counts and electrolytes -red blood cells are okay today  Blood tests to check liver and kidney function -slightly high liver test  Blood tests to check pancreas function - normal  Vital signs. See below for your results today.   Medications prescribed:   None  Take any prescribed medications only as directed.  Home care instructions:   Follow any educational materials contained in this packet.  Avoid alcohol  Take your home acid reducing medications and Carafate  Follow-up instructions: Please follow-up with your primary care provider in the next 2 days for further evaluation of your symptoms.    Return instructions:  SEEK IMMEDIATE MEDICAL ATTENTION IF:  The pain does not go away or becomes severe   A temperature above 101F develops   Repeated vomiting occurs (multiple episodes)   The pain becomes localized to portions of the abdomen. The right side could possibly be appendicitis. In an adult, the left lower portion of the abdomen could be colitis or diverticulitis.   Blood is being passed in stools or vomit (bright red or black tarry stools)   You develop chest pain, difficulty breathing, dizziness or fainting, or become confused, poorly responsive, or inconsolable (young children)  If you have any other emergent concerns regarding your health  Additional Information: Abdominal (belly) pain can be caused by many things. Your caregiver performed an examination and possibly ordered blood/urine tests and imaging (CT scan, x-rays, ultrasound). Many cases can be observed and treated at home after initial evaluation in the emergency department. Even though you are being discharged home, abdominal pain can be unpredictable. Therefore, you need a repeated exam if your pain does not resolve, returns, or worsens. Most patients  with abdominal pain don't have to be admitted to the hospital or have surgery, but serious problems like appendicitis and gallbladder attacks can start out as nonspecific pain. Many abdominal conditions cannot be diagnosed in one visit, so follow-up evaluations are very important.  Your vital signs today were: BP (!) 128/98    Pulse 94    Temp 98.8 F (37.1 C)    Resp 14    Ht 5\' 8"  (1.727 m)    Wt 52.2 kg    SpO2 100%    BMI 17.49 kg/m  If your blood pressure (bp) was elevated above 135/85 this visit, please have this repeated by your doctor within one month. --------------

## 2018-03-16 NOTE — ED Provider Notes (Signed)
[facility] EMERGENCY DEPARTMENT Provider Note   CSN: [id number] Arrival date & time: 03/16/18  1615     History   Chief Complaint Chief Complaint  Patient presents with  . Abdominal Pain  . Chest Pain    HPI [name] is a 47 y.o. male.  Patient with history of alcoholism and polysubstance abuse, GI bleeding due to peptic ulcer disease, chronic pain, frequent emergency department visits, WPW --presents to the emergency department today with complaint of worsening left upper quadrant abdominal pain and pain into his chest.  Patient states that his symptoms started yesterday.  Patient was seen in the emergency department yesterday afternoon.  He had mild anemia and otherwise reassuring labs and was discharged home.  Patient states that his symptoms have persisted.  He states that his stools have been gray in color.  He states that yesterday he vomited and noted bright red blood in the vomit.  He admits to drinking alcohol, last intake this morning.  Denies ibuprofen, Aleve, aspirin.  Last admission for GI bleeding 11/2017 --upper endoscopy performed at that time showing peptic and duodenal ulcers, gastritis.     Past Medical History:  Diagnosis Date  . Alcoholism /alcohol abuse ([place])   . Anemia   . Anxiety   . Arthritis    "knees; arms; elbows" (03/26/2015)  . Asthma   . Bipolar disorder ([place])   . Chronic bronchitis ([place])   . Chronic lower back pain   . Chronic pancreatitis ([place])   . Cocaine abuse ([place])   . Depression   . Family history of adverse reaction to anesthesia    "grandmother gets confused"  . Femoral condyle fracture ([place]) 03/08/2014   left medial/notes 03/09/2014  . GERD (gastroesophageal reflux disease)   . H/O hiatal hernia   . H/O suicide attempt 10/2012  . Heart murmur    "when he was little" (03/06/2013)  . High cholesterol   . History of blood transfusion 10/2012   "when I tried to commit suicide"  . History of stomach  ulcers   . Hypertension   . Marijuana abuse, continuous   . Migraine    "a few times/year" (03/26/2015)  . Pneumonia 1990's X 3  . PTSD (post-traumatic stress disorder)   . Shortness of breath    "can happen at anytime" (03/06/2013)  . Sickle cell trait ([place])   . WPW (Wolff-Parkinson-White syndrome)    [name] 03/06/2013    Patient Active Problem List   Diagnosis Date Noted  . Gastritis and gastroduodenitis   . Chest pain 01/08/2018  . GI bleed 11/24/2017  . Acute blood loss anemia 11/24/2017  . Atypical chest pain 11/24/2017  . Acute pancreatitis 09/28/2017  . Abdominal pain 05/27/2017  . Hematemesis 05/27/2017  . Tachycardia 03/18/2017  . Diarrhea 03/18/2017  . Acute on chronic pancreatitis ([place]) 12/17/2016  . Intractable nausea and vomiting 12/05/2016  . Verbally abusive behavior 12/05/2016  . Normocytic anemia 12/05/2016  . Alcohol use disorder, severe, dependence ([place]) 07/25/2016  . Cocaine use disorder, severe, dependence ([place]) 07/25/2016  . 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 Leukocytosis   . Hospital acquired PNA 05/20/2015  . Chronic pancreatitis ([place]) 05/18/2015  . Pseudocyst of pancreas 05/18/2015  . Polysubstance abuse (tobacco, cocaine, THC, and ETOH) 03/26/2015  . Alcohol-induced chronic pancreatitis ([place])   . Benign essential HTN 02/06/2014  . Alcohol-induced acute pancreatitis 11/28/2013  . Pancreatic pseudocyst/cyst 11/25/2013  . Severe protein-calorie malnutrition ([place])  10/10/2013  . Suicide attempt ([place]) 10/08/2013  . 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 TOBACCO ABUSE 03/23/2007    Past Surgical History:  Procedure Laterality Date  . BIOPSY  11/25/2017   Procedure: BIOPSY;  Surgeon: Arta Silence, MD;  Location: [name];  Service: Endoscopy;;  . CARDIAC CATHETERIZATION    . ESOPHAGOGASTRODUODENOSCOPY (EGD) WITH PROPOFOL N/A 11/25/2017   Procedure:  ESOPHAGOGASTRODUODENOSCOPY (EGD) WITH PROPOFOL;  Surgeon: Arta Silence, MD;  Location: [facility];  Service: Endoscopy;  Laterality: N/A;  . EYE SURGERY Left 1990's   "result of trauma"   . FACIAL FRACTURE SURGERY Left 1990's   "result of trauma"   . FRACTURE SURGERY    . HERNIA REPAIR    . LEFT HEART CATHETERIZATION WITH CORONARY ANGIOGRAM Right 03/07/2013   Procedure: LEFT HEART CATHETERIZATION WITH CORONARY ANGIOGRAM;  Surgeon: [name], MD;  Location: [facility] CATH [facility];  Service: Cardiovascular;  Laterality: Right;  . UMBILICAL HERNIA REPAIR          Home Medications    Prior to Admission medications   Medication Sig Start Date End Date Taking? Authorizing Provider  acetaminophen (TYLENOL) 500 MG tablet Take 1,000 mg by mouth every 6 (six) hours as needed for mild pain.    [provider]  famotidine (PEPCID) 20 MG tablet Take 20 mg by mouth daily.    [provider]  folic acid (FOLVITE) 1 MG tablet Take 1 tablet (1 mg total) by mouth daily. 11/26/17   [name], MD  lipase/protease/amylase (CREON) 12000 units CPEP capsule Take 1 capsule (12,000 Units total) by mouth 3 (three) times daily with meals. For pancreatitis 10/01/17   Aline August, MD  loratadine (CLARITIN) 10 MG tablet Take 1 tablet (10 mg total) by mouth daily as needed for allergies or rhinitis. (May purchase from over the counter): For allergies 10/01/17   Aline August, MD  metoprolol tartrate (LOPRESSOR) 25 MG tablet Take 1 tablet (25 mg total) by mouth 2 (two) times daily. For high blood pressure 08/29/17   Clent Demark, PA-C  Multiple Vitamin (MULTIVITAMIN WITH MINERALS) TABS tablet Take 1 tablet by mouth daily. 09/06/17   Bonnell Public, MD  ondansetron (ZOFRAN) 4 MG tablet Take 1 tablet (4 mg total) by mouth every 8 (eight) hours as needed for nausea or vomiting. 5/57/32   Delora Fuel, MD  pantoprazole (PROTONIX) 40 MG tablet Take 1 tablet (40 mg total) by mouth 2 (two) times  daily. 06/10/52   Delora Fuel, MD  QUEtiapine (SEROQUEL) 100 MG tablet Take 100 mg by mouth at bedtime.    [provider]  sertraline (ZOLOFT) 100 MG tablet Take 1 tablet (100 mg total) by mouth daily. Patient taking differently: Take 150 mg by mouth daily.  08/29/17   Clent Demark, PA-C  sucralfate (CARAFATE) 1 g tablet Take 1 tablet (1 g total) by mouth 4 (four) times daily. 03/13/18   Isla Pence, MD  thiamine 100 MG tablet Take 1 tablet (100 mg total) by mouth daily. 11/26/17   [name], MD  omeprazole (PRILOSEC) 20 MG capsule Take 1 capsule (20 mg total) daily by mouth. 03/22/17 06/21/17  Varney Biles, MD    Family History Family History  Problem Relation Age of Onset  . Hypertension Mother   . Cirrhosis Mother   . Alcoholism Mother   . Hypertension Father   . Melanoma Father   . Hypertension Other   . Coronary artery disease Other  Social History Social History   Tobacco Use  . Smoking status: Current Every Day Smoker    Packs/day: 1.00    Years: 33.00    Pack years: 33.00    Types: Cigarettes, E-cigarettes  . Smokeless tobacco: Never Used  Substance Use Topics  . Alcohol use: Yes  . Drug use: Yes    Types: Marijuana, Cocaine    Comment: daily marijuana use; last cocaine use about 3 months ago     Allergies   Robaxin [methocarbamol]; Shellfish-derived products; Trazodone; Trazodone and nefazodone; Contrast media [iodinated diagnostic agents]; and Reglan [metoclopramide]   Review of Systems Review of Systems  Constitutional: Negative for fever.  HENT: Negative for rhinorrhea and sore throat.   Eyes: Negative for redness.  Respiratory: Negative for cough.   Cardiovascular: Positive for chest pain.  Gastrointestinal: Positive for abdominal pain, nausea and vomiting. Negative for diarrhea.  Genitourinary: Negative for dysuria.  Musculoskeletal: Negative for myalgias.  Skin: Negative for rash.  Neurological: Positive for dizziness.  Negative for speech difficulty and headaches.     Physical Exam Updated Vital Signs BP (!) 128/98   Pulse 94   Temp 98.8 F (37.1 C)   Resp 14   Ht 5\' 8"  (1.727 m)   Wt 52.2 kg   SpO2 100%   BMI 17.49 kg/m   Physical Exam  Constitutional: He appears well-developed and well-nourished.  HENT:  Head: Normocephalic and atraumatic.  Eyes: Conjunctivae are normal. Right eye exhibits no discharge. Left eye exhibits no discharge.  Neck: Normal range of motion. Neck supple.  Cardiovascular: Normal rate, regular rhythm and normal heart sounds.  Pulmonary/Chest: Effort normal and breath sounds normal.  Abdominal: Soft. Bowel sounds are normal. There is tenderness in the epigastric area, periumbilical area and left upper quadrant. There is no rigidity, no rebound, no guarding, no CVA tenderness, no tenderness at McBurney's point and negative Murphy's sign.  Neurological: He is alert.  Skin: Skin is warm and dry.  Psychiatric: He has a normal mood and affect.  Nursing note and vitals reviewed.    ED Treatments / Results  Labs (all labs ordered are listed, but only abnormal results are displayed) Labs Reviewed  COMPREHENSIVE METABOLIC PANEL - Abnormal; Notable for the following components:      Result Value   CO2 19 (*)    AST 75 (*)    ALT 65 (*)    All other components within normal limits  CBC WITH DIFFERENTIAL/PLATELET - Abnormal; Notable for the following components:   RBC 3.16 (*)    Hemoglobin 11.0 (*)    HCT 32.5 (*)    MCV 102.8 (*)    MCH 34.8 (*)    RDW 17.8 (*)    All other components within normal limits  LIPASE, BLOOD  ETHANOL  I-STAT TROPONIN, ED  TYPE AND SCREEN    EKG EKG Interpretation  Date/Time:  Friday March 16 2018 16:16:42 EST Ventricular Rate:  111 PR Interval:    QRS Duration: 74 QT Interval:  271 QTC Calculation: 369 R Axis:   78 Text Interpretation:  Sinus tachycardia Biatrial enlargement Borderline repolarization abnormality  Confirmed by Dene Gentry [phone]) on 03/16/2018 4:19:21 PM   Radiology No results found.  Procedures Procedures (including critical care time)  Medications Ordered in ED Medications  alum & mag hydroxide-simeth (MAALOX/MYLANTA) 200-200-20 MG/5ML suspension 30 mL (has no administration in time range)    And  lidocaine (XYLOCAINE) 2 % viscous mouth solution 15 mL (has no administration in  time range)  sodium chloride 0.9 % bolus 1,000 mL (has no administration in time range)  pantoprazole (PROTONIX) injection 40 mg (has no administration in time range)     Initial Impression / Assessment and Plan / ED Course  I have reviewed the triage vital signs and the nursing notes.  Pertinent labs & imaging results that were available during my care of the patient were reviewed by me and considered in my medical decision making (see chart for details).     Patient seen and examined. Care plan reviewed.   Vital signs reviewed and are as follows: BP (!) 128/98   Pulse 94   Temp 98.8 F (37.1 C)   Resp 14   Ht 5\' 8"  (1.727 m)   Wt 52.2 kg   SpO2 100%   BMI 17.49 kg/m   Labs reviewed with patient.  No signs of pancreatitis today.  Liver function tests are at baseline.  Kidney function is okay.  Slightly high liver function tests.  Alcohol is not detectable.  I feel that patient's pain is most likely due to gastritis or peptic ulcer disease.  He has a history of this and GI bleeding from these.  I note the patient has received IV Toradol on previous visits however I would personally prefer to avoid this given his history of gastritis and GI bleeding.  Will give 1 dose of fentanyl prior to discharge.  Patient encouraged to take his home medications including PPI and Carafate.  He states that these do give him some relief at home.  We discussed avoidance of alcohol.  I encouraged patient to follow-up with his doctor in 2 days for recheck.  The patient was urged to return to the Emergency  Department immediately with worsening of current symptoms, worsening abdominal pain, persistent vomiting, blood noted in stools, fever, or any other concerns. The patient verbalized understanding.    Final Clinical Impressions(s) / ED Diagnoses   Final diagnoses:  Chronic abdominal pain   Patient with well-known history of chronic abdominal pain and chest pain in setting of alcohol abuse.  EKG today is reassuring and troponin is negative.  Patient was seen in the emergency department yesterday with similar symptoms.  No evidence of pancreatitis today.  Blood counts are stable.  Do not suspect significant GI bleed.  Treatment and follow-up as above.  No indications for admission at this time.  ED Discharge Orders    None       Carlisle Cater, Hershal Coria 03/16/18 1844    Valarie Merino, MD 03/17/18 [id number]

## 2018-03-16 NOTE — ED Notes (Signed)
Pt keeps asking for pain

## 2018-03-17 ENCOUNTER — Emergency Department (HOSPITAL_COMMUNITY): Payer: Self-pay

## 2018-03-17 ENCOUNTER — Other Ambulatory Visit: Payer: Self-pay

## 2018-03-17 ENCOUNTER — Emergency Department (HOSPITAL_COMMUNITY)
Admission: EM | Admit: 2018-03-17 | Discharge: 2018-03-18 | Disposition: A | Discharge status: Home / Self Care | Payer: Self-pay | Attending: Emergency Medicine | Admitting: Emergency Medicine

## 2018-03-17 DIAGNOSIS — I10 Essential (primary) hypertension: Secondary | ICD-10-CM | POA: Insufficient documentation

## 2018-03-17 DIAGNOSIS — F17210 Nicotine dependence, cigarettes, uncomplicated: Secondary | ICD-10-CM | POA: Insufficient documentation

## 2018-03-17 DIAGNOSIS — J45909 Unspecified asthma, uncomplicated: Secondary | ICD-10-CM | POA: Insufficient documentation

## 2018-03-17 DIAGNOSIS — Z79899 Other long term (current) drug therapy: Secondary | ICD-10-CM | POA: Insufficient documentation

## 2018-03-17 DIAGNOSIS — R0789 Other chest pain: Secondary | ICD-10-CM | POA: Insufficient documentation

## 2018-03-17 DIAGNOSIS — R072 Precordial pain: Secondary | ICD-10-CM

## 2018-03-17 LAB — COMPREHENSIVE METABOLIC PANEL
ALK PHOS: 91 U/L (ref 38–126)
ALT: 56 U/L — ABNORMAL HIGH (ref 0–44)
ANION GAP: 7 (ref 5–15)
AST: 57 U/L — ABNORMAL HIGH (ref 15–41)
Albumin: 3.4 g/dL — ABNORMAL LOW (ref 3.5–5.0)
BILIRUBIN TOTAL: 0.5 mg/dL (ref 0.3–1.2)
BUN: 6 mg/dL (ref 6–20)
CO2: 21 mmol/L — ABNORMAL LOW (ref 22–32)
Calcium: 8.6 mg/dL — ABNORMAL LOW (ref 8.9–10.3)
Chloride: 111 mmol/L (ref 98–111)
Creatinine, Ser: 0.53 mg/dL — ABNORMAL LOW (ref 0.61–1.24)
GFR calc non Af Amer: >60 mL/min (ref >60)
Glucose, Bld: 104 mg/dL — ABNORMAL HIGH (ref 70–99)
POTASSIUM: 4.2 mmol/L (ref 3.5–5.1)
Sodium: 139 mmol/L (ref 135–145)
TOTAL PROTEIN: 6.5 g/dL (ref 6.5–8.1)

## 2018-03-17 LAB — CBC
HEMATOCRIT: 30.5 % — AB (ref 39.0–52.0)
Hemoglobin: 9.9 g/dL — ABNORMAL LOW (ref 13.0–17.0)
MCH: 34.3 pg — AB (ref 26.0–34.0)
MCHC: 32.5 g/dL (ref 30.0–36.0)
MCV: 105.5 fL — ABNORMAL HIGH (ref 80.0–100.0)
Platelets: 278 10*3/uL (ref 150–400)
RBC: 2.89 MIL/uL — ABNORMAL LOW (ref 4.22–5.81)
RDW: 17.8 % — AB (ref 11.5–15.5)
WBC: 7.4 10*3/uL (ref 4.0–10.5)
nRBC: 0 % (ref 0.0–0.2)

## 2018-03-17 LAB — TROPONIN I

## 2018-03-17 NOTE — ED Provider Notes (Signed)
[facility] EMERGENCY DEPARTMENT Provider Note   CSN: [id number] Arrival date & time: 03/17/18  2228     History   Chief Complaint Chief Complaint  Patient presents with  . Chest Pain    HPI [name] is a 47 y.o. male.  HPI Patient is a 47 year old male presents the emergency department with complaints of chest discomfort that began approximately 4 to 5 hours ago.  He describes this is a pain in his anterior chest without radiation.  No significant shortness of breath or nausea or vomiting.  No fevers or chills.  No productive cough.  Patient is well-known to this emergency department with a long-standing history of gastritis and complaints of upper abdominal pain.  He states he is not having any significant upper abdominal pain at this time and that his chest pain a new and separate complaint.  He was hospitalized in September 2019 for atypical chest pain and underwent echocardiogram which demonstrated an EF of 60 to 65%.  He has a history of cocaine abuse.  States he has not used in the past several months.   Past Medical History:  Diagnosis Date  . Alcoholism /alcohol abuse ([place])   . Anemia   . Anxiety   . Arthritis    "knees; arms; elbows" (03/26/2015)  . Asthma   . Bipolar disorder ([place])   . Chronic bronchitis ([place])   . Chronic lower back pain   . Chronic pancreatitis ([place])   . Cocaine abuse ([place])   . Depression   . Family history of adverse reaction to anesthesia    "grandmother gets confused"  . Femoral condyle fracture ([place]) 03/08/2014   left medial/notes 03/09/2014  . GERD (gastroesophageal reflux disease)   . H/O hiatal hernia   . H/O suicide attempt 10/2012  . Heart murmur    "when he was little" (03/06/2013)  . High cholesterol   . History of blood transfusion 10/2012   "when I tried to commit suicide"  . History of stomach ulcers   . Hypertension   . Marijuana abuse, continuous   . Migraine    "a few times/year"  (03/26/2015)  . Pneumonia 1990's X 3  . PTSD (post-traumatic stress disorder)   . Shortness of breath    "can happen at anytime" (03/06/2013)  . Sickle cell trait ([place])   . WPW (Wolff-Parkinson-White syndrome)    [name] 03/06/2013    Patient Active Problem List   Diagnosis Date Noted  . Gastritis and gastroduodenitis   . Chest pain 01/08/2018  . GI bleed 11/24/2017  . Acute blood loss anemia 11/24/2017  . Atypical chest pain 11/24/2017  . Acute pancreatitis 09/28/2017  . Abdominal pain 05/27/2017  . Hematemesis 05/27/2017  . Tachycardia 03/18/2017  . Diarrhea 03/18/2017  . Acute on chronic pancreatitis ([place]) 12/17/2016  . Intractable nausea and vomiting 12/05/2016  . Verbally abusive behavior 12/05/2016  . Normocytic anemia 12/05/2016  . Alcohol use disorder, severe, dependence ([place]) 07/25/2016  . Cocaine use disorder, severe, dependence ([place]) 07/25/2016  . 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 Leukocytosis   . Hospital acquired PNA 05/20/2015  . Chronic pancreatitis ([place]) 05/18/2015  . Pseudocyst of pancreas 05/18/2015  . Polysubstance abuse (tobacco, cocaine, THC, and ETOH) 03/26/2015  . Alcohol-induced chronic pancreatitis ([place])   . Benign essential HTN 02/06/2014  . Alcohol-induced acute pancreatitis 11/28/2013  . Pancreatic pseudocyst/cyst 11/25/2013  . Severe protein-calorie malnutrition ([place]) 10/10/2013  . Suicide attempt ([place])  10/08/2013  . 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 TOBACCO ABUSE 03/23/2007    Past Surgical History:  Procedure Laterality Date  . BIOPSY  11/25/2017   Procedure: BIOPSY;  Surgeon: Arta Silence, MD;  Location: [facility];  Service: Endoscopy;;  . CARDIAC CATHETERIZATION    . ESOPHAGOGASTRODUODENOSCOPY (EGD) WITH PROPOFOL N/A 11/25/2017   Procedure: ESOPHAGOGASTRODUODENOSCOPY (EGD) WITH PROPOFOL;  Surgeon: Arta Silence, MD;  Location: [facility];  Service: Endoscopy;  Laterality: N/A;  . EYE SURGERY Left 1990's   "result of trauma"   . FACIAL FRACTURE SURGERY Left 1990's   "result of trauma"   . FRACTURE SURGERY    . HERNIA REPAIR    . LEFT HEART CATHETERIZATION WITH CORONARY ANGIOGRAM Right 03/07/2013   Procedure: LEFT HEART CATHETERIZATION WITH CORONARY ANGIOGRAM;  Surgeon: [name], MD;  Location: [facility] CATH LAB;  Service: Cardiovascular;  Laterality: Right;  . UMBILICAL HERNIA REPAIR          Home Medications    Prior to Admission medications   Medication Sig Start Date End Date Taking? Authorizing Provider  acetaminophen (TYLENOL) 500 MG tablet Take 1,000 mg by mouth every 6 (six) hours as needed for mild pain.   Yes [provider]  Albuterol Sulfate (PROAIR HFA IN) Inhale 2 puffs into the lungs every 6 (six) hours as needed (for shortness of breath).   Yes [provider]  Cyanocobalamin (VITAMIN B-12 PO) Take 1 tablet by mouth daily.   Yes [provider]  famotidine (PEPCID) 20 MG tablet Take 20 mg by mouth 2 (two) times daily.    Yes [provider]  folic acid (FOLVITE) 1 MG tablet Take 1 tablet (1 mg total) by mouth daily. 11/26/17  Yes [name], MD  lipase/protease/amylase (CREON) 12000 units CPEP capsule Take 1 capsule (12,000 Units total) by mouth 3 (three) times daily with meals. For pancreatitis 10/01/17  Yes Aline August, MD  loratadine (CLARITIN) 10 MG tablet Take 1 tablet (10 mg total) by mouth daily as needed for allergies or rhinitis. (May purchase from over the counter): For allergies 10/01/17  Yes Aline August, MD  metoprolol tartrate (LOPRESSOR) 25 MG tablet Take 1 tablet (25 mg total) by mouth 2 (two) times daily. For high blood pressure 08/29/17  Yes Clent Demark, PA-C  Multiple Vitamin (MULTIVITAMIN WITH MINERALS) TABS tablet Take 1 tablet by mouth daily. 09/06/17  Yes Bonnell Public, MD  ondansetron (ZOFRAN) 4 MG tablet Take 1 tablet  (4 mg total) by mouth every 8 (eight) hours as needed for nausea or vomiting. 05/28/39  Yes Delora Fuel, MD  pantoprazole (PROTONIX) 40 MG tablet Take 1 tablet (40 mg total) by mouth 2 (two) times daily. 7/40/81  Yes Delora Fuel, MD  QUEtiapine (SEROQUEL) 100 MG tablet Take 100 mg by mouth at bedtime.   Yes [provider]  sertraline (ZOLOFT) 100 MG tablet Take 1 tablet (100 mg total) by mouth daily. Patient taking differently: Take 150 mg by mouth daily.  08/29/17  Yes Clent Demark, PA-C  sucralfate (CARAFATE) 1 g tablet Take 1 tablet (1 g total) by mouth 4 (four) times daily. Patient taking differently: Take 1 g by mouth 4 (four) times daily -  with meals and at bedtime.  03/13/18  Yes Isla Pence, MD  thiamine 100 MG tablet Take 1 tablet (100 mg total) by mouth daily. Patient not taking: Reported on 03/17/2018 11/26/17   [name], MD  omeprazole (PRILOSEC) 20 MG capsule Take 1 capsule (20 mg total) daily by mouth. 03/22/17 06/21/17  Varney Biles, MD    Family History Family History  Problem Relation Age of Onset  . Hypertension Mother   . Cirrhosis Mother   . Alcoholism Mother   . Hypertension Father   . Melanoma Father   . Hypertension Other   . Coronary artery disease Other     Social History Social History   Tobacco Use  . Smoking status: Current Every Day Smoker    Packs/day: 1.00    Years: 33.00    Pack years: 33.00    Types: Cigarettes, E-cigarettes  . Smokeless tobacco: Never Used  Substance Use Topics  . Alcohol use: Yes  . Drug use: Yes    Types: Marijuana, Cocaine    Comment: daily marijuana use; last cocaine use about 3 months ago     Allergies   Robaxin [methocarbamol]; Shellfish-derived products; Trazodone; Trazodone and nefazodone; Adhesive [tape]; Latex; Toradol [ketorolac tromethamine]; Contrast media [iodinated diagnostic agents]; and Reglan [metoclopramide]   Review of Systems Review of Systems  All other systems  reviewed and are negative.    Physical Exam Updated Vital Signs BP (!) 128/93   Pulse 89   Temp 98.8 F (37.1 C) (Oral)   Resp 18   Wt 53 kg   SpO2 100%   BMI 17.77 kg/m   Physical Exam  Constitutional: He is oriented to person, place, and time. He appears well-developed and well-nourished.  HENT:  Head: Normocephalic and atraumatic.  Eyes: EOM are normal.  Neck: Normal range of motion.  Cardiovascular: Normal rate, regular rhythm, normal heart sounds and intact distal pulses.  Pulmonary/Chest: Effort normal and breath sounds normal. No respiratory distress.  Abdominal: Soft. He exhibits no distension. There is no tenderness.  Musculoskeletal: Normal range of motion.  Neurological: He is alert and oriented to person, place, and time.  Skin: Skin is warm and dry.  Psychiatric: He has a normal mood and affect. Judgment normal.  Nursing note and vitals reviewed.    ED Treatments / Results  Labs (all labs ordered are listed, but only abnormal results are displayed) Labs Reviewed  CBC - Abnormal; Notable for the following components:      Result Value   RBC 2.89 (*)    Hemoglobin 9.9 (*)    HCT 30.5 (*)    MCV 105.5 (*)    MCH 34.3 (*)    RDW 17.8 (*)    All other components within normal limits  COMPREHENSIVE METABOLIC PANEL - Abnormal; Notable for the following components:   CO2 21 (*)    Glucose, Bld 104 (*)    Creatinine, Ser 0.53 (*)    Calcium 8.6 (*)    Albumin 3.4 (*)    AST 57 (*)    ALT 56 (*)    All other components within normal limits  TROPONIN I    EKG EKG Interpretation  Date/Time:  Saturday March 17 2018 22:33:26 EST Ventricular Rate:  96 PR Interval:    QRS Duration: 76 QT Interval:  313 QTC Calculation: 396 R Axis:   73 Text Interpretation:  Sinus rhythm Short PR interval Consider right atrial enlargement Abnormal T, consider ischemia, diffuse leads No significant change was found Confirmed by [name] [id number] on 03/17/2018  11:27:06 PM   Radiology No results found.  Procedures Procedures (including critical care time)  Medications Ordered in ED Medications - No data to display   Initial Impression /  Assessment and Plan / ED Course  I have reviewed the triage vital signs and the nursing notes.  Pertinent labs & imaging results that were available during my care of the patient were reviewed by me and considered in my medical decision making (see chart for details).     Atypical chest pain.  EKG without ischemic changes.  Troponin negative.  Hemoglobin without significant change from baseline.  Nonspecific elevation in his AST and ALT consistent with his prior levels.  Stable for discharge from the emergency department.  Outpatient cardiology follow-up recommended.  I do not think he needs a delta troponin.  Doubt pulmonary embolism.  Doubt dissection.  Chest pain-free at this time.  Final Clinical Impressions(s) / ED Diagnoses   Final diagnoses:  None    ED Discharge Orders    None       [name], MD 03/17/18 2328

## 2018-03-17 NOTE — ED Triage Notes (Addendum)
Patient c/o CP/tightness accompanied by SOB and dizziness. 20 ga RFA. Was seen 2 times this week  for abd pain. NAD noted at this time. Was given 2 nitroglycerin with no relief. Also given 4mg  morphine with relief.

## 2018-03-23 ENCOUNTER — Emergency Department (HOSPITAL_COMMUNITY): Payer: Self-pay

## 2018-03-23 ENCOUNTER — Emergency Department (HOSPITAL_COMMUNITY)
Admission: EM | Admit: 2018-03-23 | Discharge: 2018-03-23 | Disposition: A | Discharge status: Home / Self Care | Payer: Self-pay | Attending: Emergency Medicine | Admitting: Emergency Medicine

## 2018-03-23 ENCOUNTER — Encounter (HOSPITAL_COMMUNITY): Payer: Self-pay

## 2018-03-23 DIAGNOSIS — I10 Essential (primary) hypertension: Secondary | ICD-10-CM | POA: Insufficient documentation

## 2018-03-23 DIAGNOSIS — F319 Bipolar disorder, unspecified: Secondary | ICD-10-CM | POA: Insufficient documentation

## 2018-03-23 DIAGNOSIS — Z79899 Other long term (current) drug therapy: Secondary | ICD-10-CM | POA: Insufficient documentation

## 2018-03-23 DIAGNOSIS — G8929 Other chronic pain: Secondary | ICD-10-CM | POA: Insufficient documentation

## 2018-03-23 DIAGNOSIS — R1013 Epigastric pain: Secondary | ICD-10-CM | POA: Insufficient documentation

## 2018-03-23 DIAGNOSIS — R1012 Left upper quadrant pain: Secondary | ICD-10-CM | POA: Insufficient documentation

## 2018-03-23 DIAGNOSIS — J45909 Unspecified asthma, uncomplicated: Secondary | ICD-10-CM | POA: Insufficient documentation

## 2018-03-23 DIAGNOSIS — F1210 Cannabis abuse, uncomplicated: Secondary | ICD-10-CM | POA: Insufficient documentation

## 2018-03-23 DIAGNOSIS — F1020 Alcohol dependence, uncomplicated: Secondary | ICD-10-CM | POA: Insufficient documentation

## 2018-03-23 DIAGNOSIS — R109 Unspecified abdominal pain: Secondary | ICD-10-CM

## 2018-03-23 DIAGNOSIS — Z91040 Latex allergy status: Secondary | ICD-10-CM | POA: Insufficient documentation

## 2018-03-23 DIAGNOSIS — F17210 Nicotine dependence, cigarettes, uncomplicated: Secondary | ICD-10-CM | POA: Insufficient documentation

## 2018-03-23 DIAGNOSIS — R0789 Other chest pain: Secondary | ICD-10-CM | POA: Insufficient documentation

## 2018-03-23 DIAGNOSIS — F1420 Cocaine dependence, uncomplicated: Secondary | ICD-10-CM | POA: Insufficient documentation

## 2018-03-23 DIAGNOSIS — F419 Anxiety disorder, unspecified: Secondary | ICD-10-CM | POA: Insufficient documentation

## 2018-03-23 DIAGNOSIS — D573 Sickle-cell trait: Secondary | ICD-10-CM | POA: Insufficient documentation

## 2018-03-23 LAB — CBC
HEMATOCRIT: 33 % — AB (ref 39.0–52.0)
HEMOGLOBIN: 10.7 g/dL — AB (ref 13.0–17.0)
MCH: 32.8 pg (ref 26.0–34.0)
MCHC: 32.4 g/dL (ref 30.0–36.0)
MCV: 101.2 fL — AB (ref 80.0–100.0)
NRBC: 0 % (ref 0.0–0.2)
PLATELETS: 355 10*3/uL (ref 150–400)
RBC: 3.26 MIL/uL — ABNORMAL LOW (ref 4.22–5.81)
RDW: 16.2 % — ABNORMAL HIGH (ref 11.5–15.5)
WBC: 5 10*3/uL (ref 4.0–10.5)

## 2018-03-23 LAB — I-STAT TROPONIN, ED
Troponin i, poc: 0 ng/mL (ref 0.00–0.08)
Troponin i, poc: 0 ng/mL (ref 0.00–0.08)

## 2018-03-23 LAB — LIPASE, BLOOD: LIPASE: 41 U/L (ref 11–51)

## 2018-03-23 LAB — BASIC METABOLIC PANEL
ANION GAP: 8 (ref 5–15)
BUN: 8 mg/dL (ref 6–20)
CHLORIDE: 105 mmol/L (ref 98–111)
CO2: 25 mmol/L (ref 22–32)
Calcium: 8.9 mg/dL (ref 8.9–10.3)
Creatinine, Ser: 0.7 mg/dL (ref 0.61–1.24)
GFR calc non Af Amer: >60 mL/min (ref >60)
GLUCOSE: 83 mg/dL (ref 70–99)
Potassium: 4.3 mmol/L (ref 3.5–5.1)
Sodium: 138 mmol/L (ref 135–145)

## 2018-03-23 LAB — HEPATIC FUNCTION PANEL
ALK PHOS: 88 U/L (ref 38–126)
ALT: 35 U/L (ref 0–44)
AST: 44 U/L — ABNORMAL HIGH (ref 15–41)
Albumin: 3.4 g/dL — ABNORMAL LOW (ref 3.5–5.0)
Bilirubin, Direct: 0.1 mg/dL (ref 0.0–0.2)
Indirect Bilirubin: 0.7 mg/dL (ref 0.3–0.9)
TOTAL PROTEIN: 6.7 g/dL (ref 6.5–8.1)
Total Bilirubin: 0.8 mg/dL (ref 0.3–1.2)

## 2018-03-23 MED ORDER — DICYCLOMINE HCL 10 MG/5ML PO SOLN
10.0000 mg | Freq: Once | ORAL | Status: AC
  Administered 2018-03-23: 10 mg via ORAL
  Filled 2018-03-23: qty 5

## 2018-03-23 MED ORDER — HALOPERIDOL LACTATE 5 MG/ML IJ SOLN
2.0000 mg | Freq: Once | INTRAMUSCULAR | Status: AC
  Administered 2018-03-23: 2 mg via INTRAVENOUS
  Filled 2018-03-23: qty 1

## 2018-03-23 MED ORDER — DICYCLOMINE HCL 10 MG PO CAPS
10.0000 mg | ORAL_CAPSULE | Freq: Once | ORAL | Status: AC
  Administered 2018-03-23: 10 mg via ORAL
  Filled 2018-03-23: qty 1

## 2018-03-23 MED ORDER — ALUM & MAG HYDROXIDE-SIMETH 200-200-20 MG/5ML PO SUSP
30.0000 mL | Freq: Once | ORAL | Status: AC
  Administered 2018-03-23: 30 mL via ORAL
  Filled 2018-03-23: qty 30

## 2018-03-23 NOTE — ED Provider Notes (Signed)
[facility] EMERGENCY DEPARTMENT Provider Note   CSN: [id number] Arrival date & time: 03/23/18  1700     History   Chief Complaint Chief Complaint  Patient presents with  . Chest Pain    HPI [name] is a 47 y.o. male.  47 year old male with extensive past medical history including bipolar disorder, substance abuse, chronic abdominal pain, hyperlipidemia presents with chest and abdominal pain.  Patient states that today he began having upper left abdominal pain and then later began having left-sided chest pain, approximately 1 hour prior to arrival.  He took a dose of nitroglycerin which did not relieve his symptoms.  He endorses associated "seeing spots" as well as mild shortness of breath and nausea.  No vomiting, diarrhea, fevers, or urinary symptoms.  Patient states that he has had this chest pain before in association with his abdominal pain.  This is the same abdominal pain that he has had previously.  He denies drug or alcohol use.  He has taken Tylenol and Carafate without relief.  The history is provided by the patient.  Chest Pain      Past Medical History:  Diagnosis Date  . Alcoholism /alcohol abuse ([place])   . Anemia   . Anxiety   . Arthritis    "knees; arms; elbows" (03/26/2015)  . Asthma   . Bipolar disorder ([place])   . Chronic bronchitis ([place])   . Chronic lower back pain   . Chronic pancreatitis ([place])   . Cocaine abuse ([place])   . Depression   . Family history of adverse reaction to anesthesia    "grandmother gets confused"  . Femoral condyle fracture ([place]) 03/08/2014   left medial/notes 03/09/2014  . GERD (gastroesophageal reflux disease)   . H/O hiatal hernia   . H/O suicide attempt 10/2012  . Heart murmur    "when he was little" (03/06/2013)  . High cholesterol   . History of blood transfusion 10/2012   "when I tried to commit suicide"  . History of stomach ulcers   . Hypertension   . Marijuana abuse, continuous   .  Migraine    "a few times/year" (03/26/2015)  . Pneumonia 1990's X 3  . PTSD (post-traumatic stress disorder)   . Shortness of breath    "can happen at anytime" (03/06/2013)  . Sickle cell trait ([place])   . WPW (Wolff-Parkinson-White syndrome)    [name] 03/06/2013    Patient Active Problem List   Diagnosis Date Noted  . Gastritis and gastroduodenitis   . Chest pain 01/08/2018  . GI bleed 11/24/2017  . Acute blood loss anemia 11/24/2017  . Atypical chest pain 11/24/2017  . Acute pancreatitis 09/28/2017  . Abdominal pain 05/27/2017  . Hematemesis 05/27/2017  . Tachycardia 03/18/2017  . Diarrhea 03/18/2017  . Acute on chronic pancreatitis ([place]) 12/17/2016  . Intractable nausea and vomiting 12/05/2016  . Verbally abusive behavior 12/05/2016  . Normocytic anemia 12/05/2016  . Alcohol use disorder, severe, dependence ([place]) 07/25/2016  . Cocaine use disorder, severe, dependence ([place]) 07/25/2016  . 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 Leukocytosis   . Hospital acquired PNA 05/20/2015  . Chronic pancreatitis ([place]) 05/18/2015  . Pseudocyst of pancreas 05/18/2015  . Polysubstance abuse (tobacco, cocaine, THC, and ETOH) 03/26/2015  . Alcohol-induced chronic pancreatitis ([place])   . Benign essential HTN 02/06/2014  . Alcohol-induced acute pancreatitis 11/28/2013  . Pancreatic pseudocyst/cyst 11/25/2013  . Severe protein-calorie malnutrition ([place]) 10/10/2013  . Suicide  attempt ([place]) 10/08/2013  . 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 TOBACCO ABUSE 03/23/2007    Past Surgical History:  Procedure Laterality Date  . BIOPSY  11/25/2017   Procedure: BIOPSY;  Surgeon: Arta Silence, MD;  Location: [facility];  Service: Endoscopy;;  . CARDIAC CATHETERIZATION    . ESOPHAGOGASTRODUODENOSCOPY (EGD) WITH PROPOFOL N/A 11/25/2017   Procedure: ESOPHAGOGASTRODUODENOSCOPY (EGD) WITH PROPOFOL;  Surgeon: Arta Silence, MD;  Location: [facility];  Service: Endoscopy;  Laterality: N/A;  . EYE SURGERY Left 1990's   "result of trauma"   . FACIAL FRACTURE SURGERY Left 1990's   "result of trauma"   . FRACTURE SURGERY    . HERNIA REPAIR    . LEFT HEART CATHETERIZATION WITH CORONARY ANGIOGRAM Right 03/07/2013   Procedure: LEFT HEART CATHETERIZATION WITH CORONARY ANGIOGRAM;  Surgeon: [name], MD;  Location: [facility] CATH LAB;  Service: Cardiovascular;  Laterality: Right;  . UMBILICAL HERNIA REPAIR          Home Medications    Prior to Admission medications   Medication Sig Start Date End Date Taking? Authorizing Provider  acetaminophen (TYLENOL) 500 MG tablet Take 1,000 mg by mouth every 6 (six) hours as needed for mild pain.    [provider]  Albuterol Sulfate (PROAIR HFA IN) Inhale 2 puffs into the lungs every 6 (six) hours as needed (for shortness of breath).    [provider]  Cyanocobalamin (VITAMIN B-12 PO) Take 1 tablet by mouth daily.    [provider]  famotidine (PEPCID) 20 MG tablet Take 20 mg by mouth 2 (two) times daily.     [provider]  folic acid (FOLVITE) 1 MG tablet Take 1 tablet (1 mg total) by mouth daily. 11/26/17   [name], MD  lipase/protease/amylase (CREON) 12000 units CPEP capsule Take 1 capsule (12,000 Units total) by mouth 3 (three) times daily with meals. For pancreatitis 10/01/17   Aline August, MD  loratadine (CLARITIN) 10 MG tablet Take 1 tablet (10 mg total) by mouth daily as needed for allergies or rhinitis. (May purchase from over the counter): For allergies 10/01/17   Aline August, MD  metoprolol tartrate (LOPRESSOR) 25 MG tablet Take 1 tablet (25 mg total) by mouth 2 (two) times daily. For high blood pressure 08/29/17   Clent Demark, PA-C  Multiple Vitamin (MULTIVITAMIN WITH MINERALS) TABS tablet Take 1 tablet by mouth daily. 09/06/17   Bonnell Public, MD  ondansetron (ZOFRAN) 4 MG tablet Take 1 tablet  (4 mg total) by mouth every 8 (eight) hours as needed for nausea or vomiting. 1/61/09   Delora Fuel, MD  pantoprazole (PROTONIX) 40 MG tablet Take 1 tablet (40 mg total) by mouth 2 (two) times daily. 10/10/52   Delora Fuel, MD  QUEtiapine (SEROQUEL) 100 MG tablet Take 100 mg by mouth at bedtime.    [provider]  sertraline (ZOLOFT) 100 MG tablet Take 1 tablet (100 mg total) by mouth daily. Patient taking differently: Take 150 mg by mouth daily.  08/29/17   Clent Demark, PA-C  sucralfate (CARAFATE) 1 g tablet Take 1 tablet (1 g total) by mouth 4 (four) times daily. Patient taking differently: Take 1 g by mouth 4 (four) times daily -  with meals and at bedtime.  03/13/18   Isla Pence, MD  thiamine 100 MG tablet Take 1 tablet (100 mg total) by mouth daily. Patient not taking: Reported on 03/17/2018 11/26/17   [name],  MD  omeprazole (PRILOSEC) 20 MG capsule Take 1 capsule (20 mg total) daily by mouth. 03/22/17 06/21/17  Varney Biles, MD    Family History Family History  Problem Relation Age of Onset  . Hypertension Mother   . Cirrhosis Mother   . Alcoholism Mother   . Hypertension Father   . Melanoma Father   . Hypertension Other   . Coronary artery disease Other     Social History Social History   Tobacco Use  . Smoking status: Current Every Day Smoker    Packs/day: 1.00    Years: 33.00    Pack years: 33.00    Types: Cigarettes, E-cigarettes  . Smokeless tobacco: Never Used  Substance Use Topics  . Alcohol use: Yes  . Drug use: Yes    Types: Marijuana, Cocaine    Comment: daily marijuana use; last cocaine use about 3 months ago     Allergies   Robaxin [methocarbamol]; Shellfish-derived products; Trazodone; Trazodone and nefazodone; Adhesive [tape]; Latex; Toradol [ketorolac tromethamine]; Contrast media [iodinated diagnostic agents]; and Reglan [metoclopramide]   Review of Systems Review of Systems  Cardiovascular: Positive for chest pain.    All other systems reviewed and are negative except that which was mentioned in HPI   Physical Exam Updated Vital Signs BP 131/89 (BP Location: Left Arm)   Pulse 80   Temp 99 F (37.2 C)   Resp 13   Ht 5\' 8"  (1.727 m)   Wt 61.2 kg   SpO2 97%   BMI 20.53 kg/m   Physical Exam  Constitutional: He is oriented to person, place, and time. He appears well-developed and well-nourished. No distress.  HENT:  Head: Normocephalic and atraumatic.  Moist mucous membranes  Eyes: Conjunctivae are normal.  Neck: Neck supple.  Cardiovascular: Normal rate, regular rhythm and normal heart sounds.  No murmur heard. Pulmonary/Chest: Effort normal and breath sounds normal.  Abdominal: Soft. Bowel sounds are normal. He exhibits no distension. There is tenderness (LUQ, epigastric).  Musculoskeletal: He exhibits no edema.  Neurological: He is alert and oriented to person, place, and time.  Fluent speech  Skin: Skin is warm and dry.  Psychiatric: He has a normal mood and affect. Judgment normal.  Nursing note and vitals reviewed.    ED Treatments / Results  Labs (all labs ordered are listed, but only abnormal results are displayed) Labs Reviewed  CBC - Abnormal; Notable for the following components:      Result Value   RBC 3.26 (*)    Hemoglobin 10.7 (*)    HCT 33.0 (*)    MCV 101.2 (*)    RDW 16.2 (*)    All other components within normal limits  HEPATIC FUNCTION PANEL - Abnormal; Notable for the following components:   Albumin 3.4 (*)    AST 44 (*)    All other components within normal limits  BASIC METABOLIC PANEL  LIPASE, BLOOD  I-STAT TROPONIN, ED  I-STAT TROPONIN, ED    EKG EKG Interpretation  Date/Time:  Friday March 23 2018 17:13:30 EST Ventricular Rate:  80 PR Interval:    QRS Duration: 69 QT Interval:  316 QTC Calculation: 365 R Axis:   84 Text Interpretation:  Sinus rhythm Short PR interval Biatrial enlargement Nonspecific T abnormalities, lateral leads No  significant change since last tracing Confirmed by Theotis Burrow [phone]) on 03/23/2018 7:11:51 PM   Radiology Dg Chest 2 View  Result Date: 03/23/2018 CLINICAL DATA:  Central chest pain EXAM: CHEST - 2 VIEW COMPARISON:  03/17/2018 FINDINGS: Lungs are clear.  No pleural effusion or pneumothorax. The heart is normal in size. Visualized osseous structures are within normal limits. IMPRESSION: Normal chest radiographs. Electronically Signed   By: [name] M.D.   On: 03/23/2018 18:33    Procedures Procedures (including critical care time)  Medications Ordered in ED Medications  dicyclomine (BENTYL) capsule 10 mg (has no administration in time range)  alum & mag hydroxide-simeth (MAALOX/MYLANTA) 200-200-20 MG/5ML suspension 30 mL (30 mLs Oral Given 03/23/18 1845)  dicyclomine (BENTYL) 10 MG/5ML syrup 10 mg (10 mg Oral Given 03/23/18 2049)  haloperidol lactate (HALDOL) injection 2 mg (2 mg Intravenous Given 03/23/18 1847)     Initial Impression / Assessment and Plan / ED Course  I have reviewed the triage vital signs and the nursing notes.  Pertinent labs & imaging results that were available during my care of the patient were reviewed by me and considered in my medical decision making (see chart for details).    Uncomfortable but nontoxic on exam, holding abdomen.  Vital signs reassuring.  Upper abdominal tenderness noted.  His description sounds like his chest pain came on after his abdominal pain and he has had it previously in association with abdominal pain.  Chart review shows previous similar presentation with negative cardiac work-up.  Today his EKG is unchanged from previous.  Lab work including serial troponins and LFTs, lipase unremarkable.  Based on atypical symptoms and reassuring work-up, I feel he is safe for discharge.  He requested fentanyl for pain but I explained that I do not use narcotics for chronic pain conditions.  Offered GI cocktail as well as Haldol for  nausea. Pt tolerating ice chips on reassessment.  Reviewed return precautions and patient voiced understanding.   Final Clinical Impressions(s) / ED Diagnoses   Final diagnoses:  Chronic abdominal pain  Atypical chest pain    ED Discharge Orders    None       Little, Wenda Overland, MD 03/23/18 2133

## 2018-03-23 NOTE — ED Triage Notes (Signed)
Pt from home via ems; pt c/o central cp radiating to L jaw; pt walking up stairs when pain started; pt sat down, no relief; 1 nitro PTA w/o relief; pt has hx ulcers, refused asa; pt also c/o abd pain L sided   104/60 CBG 76 HR 86 97% RA

## 2018-03-23 NOTE — ED Notes (Signed)
Patient transported to X-ray 

## 2018-03-26 ENCOUNTER — Encounter (HOSPITAL_COMMUNITY): Payer: Self-pay

## 2018-03-26 ENCOUNTER — Emergency Department (HOSPITAL_COMMUNITY)
Admission: EM | Admit: 2018-03-26 | Discharge: 2018-03-26 | Disposition: A | Discharge status: Home / Self Care | Payer: Self-pay | Attending: Emergency Medicine | Admitting: Emergency Medicine

## 2018-03-26 DIAGNOSIS — F1210 Cannabis abuse, uncomplicated: Secondary | ICD-10-CM | POA: Insufficient documentation

## 2018-03-26 DIAGNOSIS — J45909 Unspecified asthma, uncomplicated: Secondary | ICD-10-CM | POA: Insufficient documentation

## 2018-03-26 DIAGNOSIS — F1410 Cocaine abuse, uncomplicated: Secondary | ICD-10-CM | POA: Insufficient documentation

## 2018-03-26 DIAGNOSIS — G8929 Other chronic pain: Secondary | ICD-10-CM | POA: Insufficient documentation

## 2018-03-26 DIAGNOSIS — R109 Unspecified abdominal pain: Secondary | ICD-10-CM | POA: Insufficient documentation

## 2018-03-26 DIAGNOSIS — F17210 Nicotine dependence, cigarettes, uncomplicated: Secondary | ICD-10-CM | POA: Insufficient documentation

## 2018-03-26 DIAGNOSIS — R079 Chest pain, unspecified: Secondary | ICD-10-CM | POA: Insufficient documentation

## 2018-03-26 DIAGNOSIS — Z79899 Other long term (current) drug therapy: Secondary | ICD-10-CM | POA: Insufficient documentation

## 2018-03-26 DIAGNOSIS — Z91040 Latex allergy status: Secondary | ICD-10-CM | POA: Insufficient documentation

## 2018-03-26 DIAGNOSIS — F17290 Nicotine dependence, other tobacco product, uncomplicated: Secondary | ICD-10-CM | POA: Insufficient documentation

## 2018-03-26 DIAGNOSIS — I10 Essential (primary) hypertension: Secondary | ICD-10-CM | POA: Insufficient documentation

## 2018-03-26 MED ORDER — KETOROLAC TROMETHAMINE 60 MG/2ML IM SOLN
60.0000 mg | Freq: Once | INTRAMUSCULAR | Status: AC
  Administered 2018-03-26: 60 mg via INTRAMUSCULAR
  Filled 2018-03-26: qty 2

## 2018-03-26 MED ORDER — OMEPRAZOLE 20 MG PO CPDR: 20.0000 mg | DELAYED_RELEASE_CAPSULE | Freq: Every day | ORAL | 0 refills | Status: DC

## 2018-03-26 MED ORDER — HYOSCYAMINE SULFATE 0.125 MG SL SUBL
0.2500 mg | SUBLINGUAL_TABLET | Freq: Once | SUBLINGUAL | Status: AC
  Administered 2018-03-26: 0.25 mg via SUBLINGUAL
  Filled 2018-03-26: qty 2

## 2018-03-26 MED ORDER — ALUM & MAG HYDROXIDE-SIMETH 200-200-20 MG/5ML PO SUSP
30.0000 mL | Freq: Once | ORAL | Status: AC
  Administered 2018-03-26: 30 mL via ORAL
  Filled 2018-03-26: qty 30

## 2018-03-26 NOTE — ED Notes (Signed)
Pt state he undersrtands instructions. Home stable with steady gait.

## 2018-03-26 NOTE — ED Provider Notes (Signed)
[facility] Provider Note   CSN: [id number] Arrival date & time: 03/26/18  1444     History   Chief Complaint Chief Complaint  Patient presents with  . Abdominal Pain  . Chest Pain    HPI [name] is a 47 y.o. male.  HPI  The patient is here with chronic chest and abdominal pain, states he has not had a drink in a long time (admits his last beer was last night), denies cocaine use in over 7 months, endorses a history of chronic pancreatitis which she states is alcohol induced however after eating a large meal of fried chicken and biscuits today he had pain that started 20 to 30 minutes afterwards.  He has vomited once, denies any diarrhea or fevers, the pain is in the epigastrium rating up to the lower sternum.  He states this is similar to every pain that he has had but the same disease in the past.  He is requesting specifically ketamine.  He is aware that he cannot have opiate medications.  Tums are persistent for the last several hours  Past Medical History:  Diagnosis Date  . Alcoholism /alcohol abuse ([place])   . Anemia   . Anxiety   . Arthritis    "knees; arms; elbows" (03/26/2015)  . Asthma   . Bipolar disorder ([place])   . Chronic bronchitis ([place])   . Chronic lower back pain   . Chronic pancreatitis ([place])   . Cocaine abuse ([place])   . Depression   . Family history of adverse reaction to anesthesia    "grandmother gets confused"  . Femoral condyle fracture ([place]) 03/08/2014   left medial/notes 03/09/2014  . GERD (gastroesophageal reflux disease)   . H/O hiatal hernia   . H/O suicide attempt 10/2012  . Heart murmur    "when he was little" (03/06/2013)  . High cholesterol   . History of blood transfusion 10/2012   "when I tried to commit suicide"  . History of stomach ulcers   . Hypertension   . Marijuana abuse, continuous   . Migraine    "a few times/year" (03/26/2015)  . Pneumonia 1990's X 3  . PTSD  (post-traumatic stress disorder)   . Shortness of breath    "can happen at anytime" (03/06/2013)  . Sickle cell trait ([place])   . WPW (Wolff-Parkinson-White syndrome)    [name] 03/06/2013    Patient Active Problem List   Diagnosis Date Noted  . Gastritis and gastroduodenitis   . Chest pain 01/08/2018  . GI bleed 11/24/2017  . Acute blood loss anemia 11/24/2017  . Atypical chest pain 11/24/2017  . Acute pancreatitis 09/28/2017  . Abdominal pain 05/27/2017  . Hematemesis 05/27/2017  . Tachycardia 03/18/2017  . Diarrhea 03/18/2017  . Acute on chronic pancreatitis ([place]) 12/17/2016  . Intractable nausea and vomiting 12/05/2016  . Verbally abusive behavior 12/05/2016  . Normocytic anemia 12/05/2016  . Alcohol use disorder, severe, dependence ([place]) 07/25/2016  . Cocaine use disorder, severe, dependence ([place]) 07/25/2016  . 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 Leukocytosis   . Hospital acquired PNA 05/20/2015  . Chronic pancreatitis ([place]) 05/18/2015  . Pseudocyst of pancreas 05/18/2015  . Polysubstance abuse (tobacco, cocaine, THC, and ETOH) 03/26/2015  . Alcohol-induced chronic pancreatitis ([place])   . Benign essential HTN 02/06/2014  . Alcohol-induced acute pancreatitis 11/28/2013  . Pancreatic pseudocyst/cyst 11/25/2013  . Severe protein-calorie malnutrition ([place]) 10/10/2013  . Suicide attempt ([place])  10/08/2013  . 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 TOBACCO ABUSE 03/23/2007    Past Surgical History:  Procedure Laterality Date  . BIOPSY  11/25/2017   Procedure: BIOPSY;  Surgeon: Arta Silence, MD;  Location: [facility];  Service: Endoscopy;;  . CARDIAC CATHETERIZATION    . ESOPHAGOGASTRODUODENOSCOPY (EGD) WITH PROPOFOL N/A 11/25/2017   Procedure: ESOPHAGOGASTRODUODENOSCOPY (EGD) WITH PROPOFOL;  Surgeon: Arta Silence, MD;  Location: [facility];  Service: Endoscopy;  Laterality: N/A;  .  EYE SURGERY Left 1990's   "result of trauma"   . FACIAL FRACTURE SURGERY Left 1990's   "result of trauma"   . FRACTURE SURGERY    . HERNIA REPAIR    . LEFT HEART CATHETERIZATION WITH CORONARY ANGIOGRAM Right 03/07/2013   Procedure: LEFT HEART CATHETERIZATION WITH CORONARY ANGIOGRAM;  Surgeon: [name], MD;  Location: [facility];  Service: Cardiovascular;  Laterality: Right;  . UMBILICAL HERNIA REPAIR          Home Medications    Prior to Admission medications   Medication Sig Start Date End Date Taking? Authorizing Provider  acetaminophen (TYLENOL) 500 MG tablet Take 1,000 mg by mouth every 6 (six) hours as needed for mild pain.    [provider]  Albuterol Sulfate (PROAIR HFA IN) Inhale 2 puffs into the lungs every 6 (six) hours as needed (for shortness of breath).    [provider]  Cyanocobalamin (VITAMIN B-12 PO) Take 1 tablet by mouth daily.    [provider]  famotidine (PEPCID) 20 MG tablet Take 20 mg by mouth 2 (two) times daily.     [provider]  folic acid (FOLVITE) 1 MG tablet Take 1 tablet (1 mg total) by mouth daily. 11/26/17   [name], MD  lipase/protease/amylase (CREON) 12000 units CPEP capsule Take 1 capsule (12,000 Units total) by mouth 3 (three) times daily with meals. For pancreatitis 10/01/17   Aline August, MD  loratadine (CLARITIN) 10 MG tablet Take 1 tablet (10 mg total) by mouth daily as needed for allergies or rhinitis. (May purchase from over the counter): For allergies 10/01/17   Aline August, MD  metoprolol tartrate (LOPRESSOR) 25 MG tablet Take 1 tablet (25 mg total) by mouth 2 (two) times daily. For high blood pressure 08/29/17   Clent Demark, PA-C  Multiple Vitamin (MULTIVITAMIN WITH MINERALS) TABS tablet Take 1 tablet by mouth daily. 09/06/17   Bonnell Public, MD  omeprazole (PRILOSEC) 20 MG capsule Take 1 capsule (20 mg total) by mouth daily. 03/26/18   Noemi Chapel, MD  ondansetron  (ZOFRAN) 4 MG tablet Take 1 tablet (4 mg total) by mouth every 8 (eight) hours as needed for nausea or vomiting. 6/43/32   Delora Fuel, MD  pantoprazole (PROTONIX) 40 MG tablet Take 1 tablet (40 mg total) by mouth 2 (two) times daily. 9/51/88   Delora Fuel, MD  QUEtiapine (SEROQUEL) 100 MG tablet Take 100 mg by mouth at bedtime.    [provider]  sertraline (ZOLOFT) 100 MG tablet Take 1 tablet (100 mg total) by mouth daily. Patient taking differently: Take 150 mg by mouth daily.  08/29/17   Clent Demark, PA-C  sucralfate (CARAFATE) 1 g tablet Take 1 tablet (1 g total) by mouth 4 (four) times daily. Patient taking differently: Take 1 g by mouth 4 (four) times daily -  with meals and at bedtime.  03/13/18   Isla Pence, MD  thiamine 100 MG tablet Take 1  tablet (100 mg total) by mouth daily. Patient not taking: Reported on 03/17/2018 11/26/17   [name], MD    Family History Family History  Problem Relation Age of Onset  . Hypertension Mother   . Cirrhosis Mother   . Alcoholism Mother   . Hypertension Father   . Melanoma Father   . Hypertension Other   . Coronary artery disease Other     Social History Social History   Tobacco Use  . Smoking status: Current Every Day Smoker    Packs/day: 1.00    Years: 33.00    Pack years: 33.00    Types: Cigarettes, E-cigarettes  . Smokeless tobacco: Never Used  Substance Use Topics  . Alcohol use: Yes  . Drug use: Yes    Types: Marijuana, Cocaine    Comment: daily marijuana use; last cocaine use about 3 months ago     Allergies   Robaxin [methocarbamol]; Shellfish-derived products; Trazodone; Trazodone and nefazodone; Adhesive [tape]; Latex; Toradol [ketorolac tromethamine]; Contrast media [iodinated diagnostic agents]; and Reglan [metoclopramide]   Review of Systems Review of Systems  All other systems reviewed and are negative.    Physical Exam Updated Vital Signs BP (!) 138/99 (BP Location: Right Arm)    Pulse 96   Temp 97.7 F (36.5 C) (Oral)   Resp 18   SpO2 100%   Physical Exam  Constitutional: He appears well-developed and well-nourished. No distress.  HENT:  Head: Normocephalic and atraumatic.  Mouth/Throat: Oropharynx is clear and moist. No oropharyngeal exudate.  Eyes: Pupils are equal, round, and reactive to light. Conjunctivae and EOM are normal. Right eye exhibits no discharge. Left eye exhibits no discharge. No scleral icterus.  No jaundice, no icterus  Neck: Normal range of motion. Neck supple. No JVD present. No thyromegaly present.  Cardiovascular: Normal rate, regular rhythm, normal heart sounds and intact distal pulses. Exam reveals no gallop and no friction rub.  No murmur heard. Pulmonary/Chest: Effort normal and breath sounds normal. No respiratory distress. He has no wheezes. He has no rales.  Abdominal: Soft. Bowel sounds are normal. He exhibits no distension and no mass. There is tenderness ( Mild tenderness in the epigastrium, no guarding or peritoneal signs).  Musculoskeletal: Normal range of motion. He exhibits no edema or tenderness.  Lymphadenopathy:    He has no cervical adenopathy.  Neurological: He is alert. Coordination normal.  Skin: Skin is warm and dry. No rash noted. No erythema.  Psychiatric: He has a normal mood and affect. His behavior is normal.  Nursing note and vitals reviewed.    ED Treatments / Results  Labs (all labs ordered are listed, but only abnormal results are displayed) Labs Reviewed - No data to display  EKG None  Radiology No results found.  Procedures Procedures (including critical care time)  Medications Ordered in ED Medications  ketorolac (TORADOL) injection 60 mg (60 mg Intramuscular Given 03/26/18 1538)  alum & mag hydroxide-simeth (MAALOX/MYLANTA) 200-200-20 MG/5ML suspension 30 mL (30 mLs Oral Given 03/26/18 1535)  hyoscyamine (LEVSIN SL) SL tablet 0.25 mg (0.25 mg Sublingual Given 03/26/18 1536)      Initial Impression / Assessment and Plan / ED Course  I have reviewed the triage vital signs and the nursing notes.  Pertinent labs & imaging results that were available during my care of the patient were reviewed by me and considered in my medical decision making (see chart for details).     The patient has what appears to be recurrent pancreatitis,  he is asking for ketamine, at this time he has normal vital signs and I do not feel the necessity to be aggressive about his pain control as he is aware that we do not treat his chronic pain.  He does not appear unstable, he does not appear decompensated, he has been informed that is not just alcohol but even dietary indiscretions that can lead to pancreatitis.  He will be given a GI cocktail and Toradol.  I am aware that the patient has a history of stomach ulcers, I do not think he has a perforated ulcer and given my recommendations that he take GI medication such as Pepcid or Prilosec he can be treated for this long-term.  I do not think there is a need to do further imaging as I do not think he has perforated viscus, I do not think he is decompensated pancreatitis, this is the same thing he is here for frequently and was here for on the 15th of this month as well.  Final Clinical Impressions(s) / ED Diagnoses   Final diagnoses:  Chronic abdominal pain    ED Discharge Orders         Ordered    omeprazole (PRILOSEC) 20 MG capsule  Daily     03/26/18 1616           Noemi Chapel, MD 03/26/18 [phone]

## 2018-03-26 NOTE — Discharge Instructions (Addendum)
ER for worsening symtpoms Prilosec daily for 30 days Substance Abuse Treatment Programs  Intensive Outpatient Programs [place]. [place], [place]       [facility] [address] [place], [place]  [employer] [facility]     (Inpatient and outpatient)     [address]           [place] [phone] (Suboxone and Methadone)  [place], [place] [address]      [place] [address] [place], [place]  [facility] [place] (Outpatient/Inpatient, [place])    (insurance only) [phone]             [facility] ([place]) [place], [place]     [facility]     [address]     [place], [place]       [facility] (for caregivers and family) [phone] [address] Kristeen Mans. [place], [place]      Residential Treatment Programs [facility]      [address], [place], [place] [address]  [phone]       [facility] [address], [place], [place] [address] [phone]  [facility] [place]        [phone]       [facility] [place] [phone]  [facility] ([place]             [place], [place] or [place] of [place], [address] [phone]  [facility] [place]    [address]      [place], [place]       [facility] [address] [place], [place]  [place]   [address] [place], [place] [address]     [phone]      Admissions: 8am-3pm M-F  [facility]idential Treatment  Services (RTS) [address] [place], [place]  [facility] Program: Residential Program [phone] Days)   [place], [place] or [phone]     [facility]: [facility] [place], [place] (Walk in Hours over the weekend or by referral)  [facility] [address], [place], [place] [address] [phone]  Crisis Mobile: [facility]:  [phone] (for crisis response 24 hours a day) [place]:      [phone]  Outpatient Psychiatry and Counseling  [facility]: Mobile Crisis Management 24 hours:  [phone]  [facility] of the [employer] sliding scale fee and walk in schedule: M-F 8am-12pm/1pm-3pm [place], [place] [address] [place], [place] [address] [phone]  [facility] ([facility] as [facility] [employer])- new patient walk-in appointments available Monday - Friday 8am -3pm.          [address] [place], [place] [address] [phone] or crisis line- [facility]/ Intensive Outpatient Therapy Program [place], [place] [address] [place]      [phone] [address] [place], [place] [address]                 [place]   [facility] [phone]. [place], [place] [address]   [employer] of Care          [address] Johnette Abraham  [place], [place] [address]       [phone]  [facility] [address] [place], [place] [address] [phone]  [facility]    [address] [place], [place] [address]     [place], [place] Joycelyn Man     [place] [address]     [phone]        [facility] [place] [address]  [facility]     [address] [place], [place], [place], [place] [address] [place]     [address]     [place], [place] [address]     [phone]       [facility] [address] [place], [place] [address] [phone] Resources for Temporary Residential Assistance/Crisis [facility]) M-F 8am-3pm   [address] [place], [place] [address]   [phone] Services include: laundry, barbering, support groups, case management, phone  & computer access, showers, AA/NA mtgs, mental health/substance abuse nurse, job skills class, disability information, VA assistance, spiritual classes, etc.   [facility]   [address] [place]     [place]              [employer] (women and children)       [place]. [place], [place] [address] [phone] [email or web address] for application and process Application Required  [facility]   [address]    [place] [address]     [phone]                    [place], [place] [address] [phone]([email or web address] application appt.) Application Required  [facility] (women only)    [place]     [place],  [address]  [phone]      Intake starts 6pm daily Need valid ID, SSC, & Police report [employer] [address] [place], [place] [phone] Application Required  [employer] (men only)     [place].      [place], [place]       [place] (Pregnant women only) [address] [place], [place]  [facility]      [place] Dani [place].      [place], [place] [address]     [phone]             [facility] [address] [place], [place] 90 day commitment/SA/Application process  [facility](men only)     [address] [place] [address]     [place], [place]       Check-in at [facility] [address] [place],  [address] [phone] Men/Women/Women and Children must be there by 7 pm  [place], [place]

## 2018-03-26 NOTE — ED Triage Notes (Signed)
Pt BIB ems for reports of abd pain that radiates to his chest. Per ems the pain started in his left jaw and radiated to his chest. Pain 8/10. Vomit x1. NAD noted

## 2018-03-27 ENCOUNTER — Other Ambulatory Visit: Payer: Self-pay

## 2018-03-27 ENCOUNTER — Encounter (HOSPITAL_COMMUNITY): Payer: Self-pay

## 2018-03-27 ENCOUNTER — Emergency Department (HOSPITAL_COMMUNITY)
Admission: EM | Admit: 2018-03-27 | Discharge: 2018-03-27 | Disposition: A | Discharge status: Home / Self Care | Payer: Self-pay | Attending: Emergency Medicine | Admitting: Emergency Medicine

## 2018-03-27 DIAGNOSIS — Z91040 Latex allergy status: Secondary | ICD-10-CM | POA: Insufficient documentation

## 2018-03-27 DIAGNOSIS — G8929 Other chronic pain: Secondary | ICD-10-CM

## 2018-03-27 DIAGNOSIS — J45909 Unspecified asthma, uncomplicated: Secondary | ICD-10-CM | POA: Insufficient documentation

## 2018-03-27 DIAGNOSIS — Z79899 Other long term (current) drug therapy: Secondary | ICD-10-CM | POA: Insufficient documentation

## 2018-03-27 DIAGNOSIS — F17210 Nicotine dependence, cigarettes, uncomplicated: Secondary | ICD-10-CM | POA: Insufficient documentation

## 2018-03-27 DIAGNOSIS — I10 Essential (primary) hypertension: Secondary | ICD-10-CM | POA: Insufficient documentation

## 2018-03-27 DIAGNOSIS — K861 Other chronic pancreatitis: Secondary | ICD-10-CM

## 2018-03-27 DIAGNOSIS — R109 Unspecified abdominal pain: Secondary | ICD-10-CM

## 2018-03-27 MED ORDER — FAMOTIDINE IN NACL 20-0.9 MG/50ML-% IV SOLN
20.0000 mg | Freq: Once | INTRAVENOUS | Status: AC
  Administered 2018-03-27: 20 mg via INTRAVENOUS
  Filled 2018-03-27: qty 50

## 2018-03-27 MED ORDER — HALOPERIDOL LACTATE 5 MG/ML IJ SOLN
5.0000 mg | Freq: Once | INTRAMUSCULAR | Status: AC
  Administered 2018-03-27: 5 mg via INTRAVENOUS
  Filled 2018-03-27: qty 1

## 2018-03-27 MED ORDER — DICYCLOMINE HCL 10 MG/ML IM SOLN
20.0000 mg | Freq: Once | INTRAMUSCULAR | Status: AC
  Administered 2018-03-27: 20 mg via INTRAMUSCULAR
  Filled 2018-03-27: qty 2

## 2018-03-27 MED ORDER — ALUM & MAG HYDROXIDE-SIMETH 200-200-20 MG/5ML PO SUSP
30.0000 mL | Freq: Once | ORAL | Status: AC
  Administered 2018-03-27: 30 mL via ORAL
  Filled 2018-03-27: qty 30

## 2018-03-27 MED ORDER — LACTATED RINGERS IV BOLUS
2000.0000 mL | Freq: Once | INTRAVENOUS | Status: AC
  Administered 2018-03-27: 2000 mL via INTRAVENOUS

## 2018-03-27 MED ORDER — ONDANSETRON HCL 4 MG/2ML IJ SOLN: 4.0000 mg | Freq: Once | INTRAMUSCULAR | Status: DC

## 2018-03-27 MED ORDER — KETAMINE HCL 50 MG/5ML IJ SOSY
0.3000 mg/kg | PREFILLED_SYRINGE | Freq: Once | INTRAMUSCULAR | Status: AC
  Administered 2018-03-27: 18 mg via INTRAVENOUS
  Filled 2018-03-27: qty 5

## 2018-03-27 MED ORDER — LIDOCAINE VISCOUS HCL 2 % MT SOLN
15.0000 mL | Freq: Once | OROMUCOSAL | Status: AC
  Administered 2018-03-27: 15 mL via ORAL
  Filled 2018-03-27: qty 15

## 2018-03-27 NOTE — ED Triage Notes (Signed)
Pt reports to [facility] ER via [facility] for chest and abd pain, central and midline, 9/10 with N/V x 2. Vomiting clear small amount saliva per pt report. Pt does not appear in distress at this time. VSS. Pt has been seen multiple times this month for similar instance.

## 2018-03-27 NOTE — ED Provider Notes (Signed)
[facility] [facility] EMERGENCY DEPARTMENT Provider Note   CSN: [id number] Arrival date & time: 03/27/18  1612     History   Chief Complaint No chief complaint on file.   HPI [name] is a 47 y.o. male.  Patient is a 47 year old male with a history of alcoholism, chronic pancreatitis on Creon, WPW, marijuana and cocaine abuse who states that he has not been using alcohol recently but was seen in the emergency room yesterday with abdominal pain and initially was feeling a little better but then tried to eat something after he went home and the pain has returned.  He states the pain is a 9 out of 10 and sharp in nature.  It is in his left upper quadrant and radiates into his left side.  It also radiates up into his chest and he has had 2 episodes of vomiting and one loose stool today.  He states the pain today is worse than yesterday but still feels typical of his pancreatitis-like pain.  He denies any fevers or urinary symptoms.  He has had no cough or shortness of breath.  He has Carafate, Prilosec, Protonix which he states he is taking all of these.  He plans on following up with Dr. Paulita Fujita his GI doctor once he has $275 and can pay upfront to be seen.  He does have a known pseudocyst and he feels that that is what is causing his pain today.  Patient was seen in the emergency room on 03/23/2018, 03/26/2018.  Labs and delta troponins were done on 03/23/2018 and they were all unchanged from his baseline.  The history is provided by the patient.    Past Medical History:  Diagnosis Date  . Alcoholism /alcohol abuse ([place])   . Anemia   . Anxiety   . Arthritis    "knees; arms; elbows" (03/26/2015)  . Asthma   . Bipolar disorder ([place])   . Chronic bronchitis ([place])   . Chronic lower back pain   . Chronic pancreatitis ([place])   . Cocaine abuse ([place])   . Depression   . Family history of adverse reaction to anesthesia    "grandmother gets confused"  . Femoral condyle  fracture ([place]) 03/08/2014   left medial/notes 03/09/2014  . GERD (gastroesophageal reflux disease)   . H/O hiatal hernia   . H/O suicide attempt 10/2012  . Heart murmur    "when he was little" (03/06/2013)  . High cholesterol   . History of blood transfusion 10/2012   "when I tried to commit suicide"  . History of stomach ulcers   . Hypertension   . Marijuana abuse, continuous   . Migraine    "a few times/year" (03/26/2015)  . Pneumonia 1990's X 3  . PTSD (post-traumatic stress disorder)   . Shortness of breath    "can happen at anytime" (03/06/2013)  . Sickle cell trait ([place])   . WPW (Wolff-Parkinson-White syndrome)    [name] 03/06/2013    Patient Active Problem List   Diagnosis Date Noted  . Gastritis and gastroduodenitis   . Chest pain 01/08/2018  . GI bleed 11/24/2017  . Acute blood loss anemia 11/24/2017  . Atypical chest pain 11/24/2017  . Acute pancreatitis 09/28/2017  . Abdominal pain 05/27/2017  . Hematemesis 05/27/2017  . Tachycardia 03/18/2017  . Diarrhea 03/18/2017  . Acute on chronic pancreatitis ([place]) 12/17/2016  . Intractable nausea and vomiting 12/05/2016  . Verbally abusive behavior 12/05/2016  . Normocytic anemia 12/05/2016  . Alcohol  use disorder, severe, dependence ([place]) 07/25/2016  . Cocaine use disorder, severe, dependence ([place]) 07/25/2016  . Major depressive disorder, recurrent severe without psychotic features ([place]) 07/20/2016  . Leukocytosis   . Hospital acquired PNA 05/20/2015  . Chronic pancreatitis ([place]) 05/18/2015  . Pseudocyst of pancreas 05/18/2015  . Polysubstance abuse (tobacco, cocaine, THC, and ETOH) 03/26/2015  . Alcohol-induced chronic pancreatitis ([place])   . Benign essential HTN 02/06/2014  . Alcohol-induced acute pancreatitis 11/28/2013  . Pancreatic pseudocyst/cyst 11/25/2013  . Severe protein-calorie malnutrition ([place]) 10/10/2013  . Suicide attempt ([place]) 10/08/2013  . [name] Parkinson White pattern seen on electrocardiogram  10/03/2012  . TOBACCO ABUSE 03/23/2007    Past Surgical History:  Procedure Laterality Date  . BIOPSY  11/25/2017   Procedure: BIOPSY;  Surgeon: Arta Silence, MD;  Location: [facility];  Service: Endoscopy;;  . CARDIAC CATHETERIZATION    . ESOPHAGOGASTRODUODENOSCOPY (EGD) WITH PROPOFOL N/A 11/25/2017   Procedure: ESOPHAGOGASTRODUODENOSCOPY (EGD) WITH PROPOFOL;  Surgeon: Arta Silence, MD;  Location: [facility];  Service: Endoscopy;  Laterality: N/A;  . EYE SURGERY Left 1990's   "result of trauma"   . FACIAL FRACTURE SURGERY Left 1990's   "result of trauma"   . FRACTURE SURGERY    . HERNIA REPAIR    . LEFT HEART CATHETERIZATION WITH CORONARY ANGIOGRAM Right 03/07/2013   Procedure: LEFT HEART CATHETERIZATION WITH CORONARY ANGIOGRAM;  Surgeon: [name], MD;  Location: [facility] CATH LAB;  Service: Cardiovascular;  Laterality: Right;  . UMBILICAL HERNIA REPAIR          Home Medications    Prior to Admission medications   Medication Sig Start Date End Date Taking? Authorizing Provider  acetaminophen (TYLENOL) 500 MG tablet Take 1,000 mg by mouth every 6 (six) hours as needed for mild pain.    [provider]  Albuterol Sulfate (PROAIR HFA IN) Inhale 2 puffs into the lungs every 6 (six) hours as needed (for shortness of breath).    [provider]  Cyanocobalamin (VITAMIN B-12 PO) Take 1 tablet by mouth daily.    [provider]  famotidine (PEPCID) 20 MG tablet Take 20 mg by mouth 2 (two) times daily.     [provider]  folic acid (FOLVITE) 1 MG tablet Take 1 tablet (1 mg total) by mouth daily. 11/26/17   [name], MD  lipase/protease/amylase (CREON) 12000 units CPEP capsule Take 1 capsule (12,000 Units total) by mouth 3 (three) times daily with meals. For pancreatitis 10/01/17   Aline August, MD  loratadine (CLARITIN) 10 MG tablet Take 1 tablet (10 mg total) by mouth daily as needed for allergies or rhinitis. (May purchase from over  the counter): For allergies 10/01/17   Aline August, MD  metoprolol tartrate (LOPRESSOR) 25 MG tablet Take 1 tablet (25 mg total) by mouth 2 (two) times daily. For high blood pressure 08/29/17   Clent Demark, PA-C  Multiple Vitamin (MULTIVITAMIN WITH MINERALS) TABS tablet Take 1 tablet by mouth daily. 09/06/17   Bonnell Public, MD  omeprazole (PRILOSEC) 20 MG capsule Take 1 capsule (20 mg total) by mouth daily. 03/26/18   Noemi Chapel, MD  ondansetron (ZOFRAN) 4 MG tablet Take 1 tablet (4 mg total) by mouth every 8 (eight) hours as needed for nausea or vomiting. 12/25/27   Delora Fuel, MD  pantoprazole (PROTONIX) 40 MG tablet Take 1 tablet (40 mg total) by mouth 2 (two) times daily. 9/37/16   Delora Fuel, MD  QUEtiapine (SEROQUEL) 100 MG tablet Take  100 mg by mouth at bedtime.    [provider]  sertraline (ZOLOFT) 100 MG tablet Take 1 tablet (100 mg total) by mouth daily. Patient taking differently: Take 150 mg by mouth daily.  08/29/17   Clent Demark, PA-C  sucralfate (CARAFATE) 1 g tablet Take 1 tablet (1 g total) by mouth 4 (four) times daily. Patient taking differently: Take 1 g by mouth 4 (four) times daily -  with meals and at bedtime.  03/13/18   Isla Pence, MD  thiamine 100 MG tablet Take 1 tablet (100 mg total) by mouth daily. Patient not taking: Reported on 03/17/2018 11/26/17   [name], MD    Family History Family History  Problem Relation Age of Onset  . Hypertension Mother   . Cirrhosis Mother   . Alcoholism Mother   . Hypertension Father   . Melanoma Father   . Hypertension Other   . Coronary artery disease Other     Social History Social History   Tobacco Use  . Smoking status: Current Every Day Smoker    Packs/day: 1.00    Years: 33.00    Pack years: 33.00    Types: Cigarettes, E-cigarettes  . Smokeless tobacco: Never Used  Substance Use Topics  . Alcohol use: Yes  . Drug use: Yes    Types: Marijuana, Cocaine    Comment:  daily marijuana use; last cocaine use about 3 months ago     Allergies   Robaxin [methocarbamol]; Shellfish-derived products; Trazodone; Trazodone and nefazodone; Adhesive [tape]; Latex; Toradol [ketorolac tromethamine]; Contrast media [iodinated diagnostic agents]; and Reglan [metoclopramide]   Review of Systems Review of Systems  All other systems reviewed and are negative.    Physical Exam Updated Vital Signs BP (!) 162/108 (BP Location: Right Arm)   Pulse (!) 102   Temp 98.2 F (36.8 C) (Oral)   Resp 15   SpO2 100%   Physical Exam  Constitutional: He is oriented to person, place, and time. He appears well-developed and well-nourished. No distress.  HENT:  Head: Normocephalic and atraumatic.  Mouth/Throat: Oropharynx is clear and moist.  Eyes: Pupils are equal, round, and reactive to light. Conjunctivae and EOM are normal.  Neck: Normal range of motion. Neck supple.  Cardiovascular: Normal rate, regular rhythm and intact distal pulses.  No murmur heard. Pulmonary/Chest: Effort normal and breath sounds normal. No respiratory distress. He has no wheezes. He has no rales.  Abdominal: Soft. Bowel sounds are normal. He exhibits no distension. There is tenderness in the left upper quadrant. There is guarding. There is no rebound.  Musculoskeletal: Normal range of motion. He exhibits no edema or tenderness.  Neurological: He is alert and oriented to person, place, and time.  Skin: Skin is warm and dry. No rash noted. No erythema.  Psychiatric: He has a normal mood and affect. His behavior is normal.  Nursing note and vitals reviewed.    ED Treatments / Results  Labs (all labs ordered are listed, but only abnormal results are displayed) Labs Reviewed - No data to display  EKG EKG Interpretation  Date/Time:  Tuesday March 27 2018 16:18:34 EST Ventricular Rate:  98 PR Interval:    QRS Duration: 70 QT Interval:  289 QTC Calculation: 369 R Axis:   76 Text  Interpretation:  Sinus rhythm Short PR interval Biatrial enlargement Borderline repolarization abnormality Baseline wander in lead(s) V2 No significant change since last tracing Confirmed by Blanchie Dessert [id number]) on 03/27/2018 4:29:18 PM   Radiology  No results found.  Procedures Procedures (including critical care time)  Medications Ordered in ED Medications  lactated ringers bolus 2,000 mL (has no administration in time range)  famotidine (PEPCID) IVPB 20 mg premix (has no administration in time range)  alum & mag hydroxide-simeth (MAALOX/MYLANTA) 200-200-20 MG/5ML suspension 30 mL (has no administration in time range)    And  lidocaine (XYLOCAINE) 2 % viscous mouth solution 15 mL (has no administration in time range)  haloperidol lactate (HALDOL) injection 5 mg (has no administration in time range)     Initial Impression / Assessment and Plan / ED Course  I have reviewed the triage vital signs and the nursing notes.  Pertinent labs & imaging results that were available during my care of the patient were reviewed by me and considered in my medical decision making (see chart for details).     Patient is a 47 year old male well-known to the emergency room with multiple medical problems including chronic pancreatitis from alcohol use in the past.  Patient denies using alcohol in the last few days but states he was feeling better after being seen in the ER yesterday but went home and tried to eat and the pain returned.  He has had 2 episodes of vomiting.  On exam he is mildly tachycardic with a heart rate in the low 100s.  Otherwise breath sounds are clear.  He states the pain starts in his abdomen and radiates up into his chest with low suspicion for ACS at this time.  Low suspicion for lung disease.  Patient's EKG is unchanged from prior but he does have repolarization abnormality and a short PR but known history of WPW.  Patient is still taking Creon, Prilosec, Protonix and Carafate.   He is requesting fentanyl and a GI cocktail.  Discussed with him that we do not typically give narcotic medications for chronic pain.  He did receive Toradol and GI cocktail yesterday.  Due to his history of stomach ulcers will avoid NSAIDs today.  Will give Pepcid, Haldol and GI cocktail.  Based on prior emergency room notes from this week and labs done 4 days ago which were all unchanged seems like patient symptoms today are similar to his chronic symptoms.  After IV fluids and medications will reevaluate.  6:59 PM After multiple meds and fluids pt is feeling better.  States pain is now a 5/10.  Po challenged without vomiting.  Will d/c home.  Final Clinical Impressions(s) / ED Diagnoses   Final diagnoses:  Chronic abdominal pain  Chronic pancreatitis, unspecified pancreatitis type [facility])    ED Discharge Orders    None       Blanchie Dessert, MD 03/29/18 1103

## 2018-03-27 NOTE — Discharge Instructions (Signed)
Continue to avoid alcohol and eat a very bland diet.  Take your protonix, carafate and creon like you are doing.  Make sure you are drinking lots of fluids.

## 2018-04-01 ENCOUNTER — Other Ambulatory Visit: Payer: Self-pay

## 2018-04-01 ENCOUNTER — Encounter (HOSPITAL_COMMUNITY): Payer: Self-pay | Admitting: Emergency Medicine

## 2018-04-01 ENCOUNTER — Emergency Department (HOSPITAL_COMMUNITY)
Admission: EM | Admit: 2018-04-01 | Discharge: 2018-04-01 | Disposition: A | Discharge status: Home / Self Care | Payer: Self-pay | Attending: Emergency Medicine | Admitting: Emergency Medicine

## 2018-04-01 DIAGNOSIS — G8929 Other chronic pain: Secondary | ICD-10-CM | POA: Insufficient documentation

## 2018-04-01 DIAGNOSIS — J45909 Unspecified asthma, uncomplicated: Secondary | ICD-10-CM | POA: Insufficient documentation

## 2018-04-01 DIAGNOSIS — I10 Essential (primary) hypertension: Secondary | ICD-10-CM | POA: Insufficient documentation

## 2018-04-01 DIAGNOSIS — R109 Unspecified abdominal pain: Secondary | ICD-10-CM

## 2018-04-01 DIAGNOSIS — F17210 Nicotine dependence, cigarettes, uncomplicated: Secondary | ICD-10-CM | POA: Insufficient documentation

## 2018-04-01 DIAGNOSIS — Z79899 Other long term (current) drug therapy: Secondary | ICD-10-CM | POA: Insufficient documentation

## 2018-04-01 DIAGNOSIS — R1013 Epigastric pain: Secondary | ICD-10-CM | POA: Insufficient documentation

## 2018-04-01 LAB — CBC
HEMATOCRIT: 35.4 % — AB (ref 39.0–52.0)
HEMOGLOBIN: 11.6 g/dL — AB (ref 13.0–17.0)
MCH: 33.7 pg (ref 26.0–34.0)
MCHC: 32.8 g/dL (ref 30.0–36.0)
MCV: 102.9 fL — ABNORMAL HIGH (ref 80.0–100.0)
Platelets: 250 10*3/uL (ref 150–400)
RBC: 3.44 MIL/uL — AB (ref 4.22–5.81)
RDW: 15.1 % (ref 11.5–15.5)
WBC: 5.6 10*3/uL (ref 4.0–10.5)
nRBC: 0 % (ref 0.0–0.2)

## 2018-04-01 LAB — COMPREHENSIVE METABOLIC PANEL
ALBUMIN: 3.5 g/dL (ref 3.5–5.0)
ALT: 54 U/L — ABNORMAL HIGH (ref 0–44)
ANION GAP: 11 (ref 5–15)
AST: 55 U/L — AB (ref 15–41)
Alkaline Phosphatase: 92 U/L (ref 38–126)
BILIRUBIN TOTAL: 0.5 mg/dL (ref 0.3–1.2)
CO2: 21 mmol/L — ABNORMAL LOW (ref 22–32)
Calcium: 8.6 mg/dL — ABNORMAL LOW (ref 8.9–10.3)
Chloride: 106 mmol/L (ref 98–111)
Creatinine, Ser: 0.59 mg/dL — ABNORMAL LOW (ref 0.61–1.24)
GLUCOSE: 129 mg/dL — AB (ref 70–99)
POTASSIUM: 3.8 mmol/L (ref 3.5–5.1)
Sodium: 138 mmol/L (ref 135–145)
Total Protein: 7.1 g/dL (ref 6.5–8.1)

## 2018-04-01 LAB — LIPASE, BLOOD: Lipase: 39 U/L (ref 11–51)

## 2018-04-01 MED ORDER — FAMOTIDINE IN NACL 20-0.9 MG/50ML-% IV SOLN
20.0000 mg | Freq: Once | INTRAVENOUS | Status: AC
  Administered 2018-04-01: 20 mg via INTRAVENOUS
  Filled 2018-04-01: qty 50

## 2018-04-01 MED ORDER — LIDOCAINE VISCOUS HCL 2 % MT SOLN
15.0000 mL | Freq: Once | OROMUCOSAL | Status: AC
  Administered 2018-04-01: 15 mL via ORAL
  Filled 2018-04-01: qty 15

## 2018-04-01 MED ORDER — ALUM & MAG HYDROXIDE-SIMETH 200-200-20 MG/5ML PO SUSP
30.0000 mL | Freq: Once | ORAL | Status: AC
  Administered 2018-04-01: 30 mL via ORAL
  Filled 2018-04-01: qty 30

## 2018-04-01 MED ORDER — SODIUM CHLORIDE 0.9 % IV BOLUS
1000.0000 mL | Freq: Once | INTRAVENOUS | Status: AC
  Administered 2018-04-01: 1000 mL via INTRAVENOUS

## 2018-04-01 NOTE — ED Provider Notes (Signed)
[facility] EMERGENCY DEPARTMENT Provider Note   CSN: [id number] Arrival date & time: 04/01/18  0175     History   Chief Complaint Chief Complaint  Patient presents with  . Chest Pain  . Abdominal Pain    HPI [name] is a 47 y.o. male.  HPI Patient with history of chronic abdominal pain.  He reports that he has had pain now for 3 days.  He indicates his epigastric region.  He reports it feels like his pancreatitis.  It is aching and burning in quality.  Patient reports he has not been drinking any alcohol or using any drugs for the pain persists.  He has not had any active vomiting or diarrhea.  No fever no burning with urination. Past Medical History:  Diagnosis Date  . Alcoholism /alcohol abuse ([place])   . Anemia   . Anxiety   . Arthritis    "knees; arms; elbows" (03/26/2015)  . Asthma   . Bipolar disorder ([place])   . Chronic bronchitis ([place])   . Chronic lower back pain   . Chronic pancreatitis ([place])   . Cocaine abuse ([place])   . Depression   . Family history of adverse reaction to anesthesia    "grandmother gets confused"  . Femoral condyle fracture ([place]) 03/08/2014   left medial/notes 03/09/2014  . GERD (gastroesophageal reflux disease)   . H/O hiatal hernia   . H/O suicide attempt 10/2012  . Heart murmur    "when he was little" (03/06/2013)  . High cholesterol   . History of blood transfusion 10/2012   "when I tried to commit suicide"  . History of stomach ulcers   . Hypertension   . Marijuana abuse, continuous   . Migraine    "a few times/year" (03/26/2015)  . Pneumonia 1990's X 3  . PTSD (post-traumatic stress disorder)   . Shortness of breath    "can happen at anytime" (03/06/2013)  . Sickle cell trait ([place])   . WPW (Wolff-Parkinson-White syndrome)    [name] 03/06/2013    Patient Active Problem List   Diagnosis Date Noted  . Gastritis and gastroduodenitis   . Chest pain 01/08/2018  . GI bleed 11/24/2017  . Acute blood  loss anemia 11/24/2017  . Atypical chest pain 11/24/2017  . Acute pancreatitis 09/28/2017  . Abdominal pain 05/27/2017  . Hematemesis 05/27/2017  . Tachycardia 03/18/2017  . Diarrhea 03/18/2017  . Acute on chronic pancreatitis ([place]) 12/17/2016  . Intractable nausea and vomiting 12/05/2016  . Verbally abusive behavior 12/05/2016  . Normocytic anemia 12/05/2016  . Alcohol use disorder, severe, dependence ([place]) 07/25/2016  . Cocaine use disorder, severe, dependence ([place]) 07/25/2016  . Major depressive disorder, recurrent severe without psychotic features ([place]) 07/20/2016  . Leukocytosis   . Hospital acquired PNA 05/20/2015  . Chronic pancreatitis ([place]) 05/18/2015  . Pseudocyst of pancreas 05/18/2015  . Polysubstance abuse (tobacco, cocaine, THC, and ETOH) 03/26/2015  . Alcohol-induced chronic pancreatitis ([place])   . Benign essential HTN 02/06/2014  . Alcohol-induced acute pancreatitis 11/28/2013  . Pancreatic pseudocyst/cyst 11/25/2013  . Severe protein-calorie malnutrition ([place]) 10/10/2013  . Suicide attempt ([place]) 10/08/2013  . 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 TOBACCO ABUSE 03/23/2007    Past Surgical History:  Procedure Laterality Date  . BIOPSY  11/25/2017   Procedure: BIOPSY;  Surgeon: Arta Silence, MD;  Location: [facility];  Service: Endoscopy;;  . CARDIAC CATHETERIZATION    . ESOPHAGOGASTRODUODENOSCOPY (EGD) WITH PROPOFOL N/A 11/25/2017  Procedure: ESOPHAGOGASTRODUODENOSCOPY (EGD) WITH PROPOFOL;  Surgeon: Arta Silence, MD;  Location: [facility] ENDOSCOPY;  Service: Endoscopy;  Laterality: N/A;  . EYE SURGERY Left 1990's   "result of trauma"   . FACIAL FRACTURE SURGERY Left 1990's   "result of trauma"   . FRACTURE SURGERY    . HERNIA REPAIR    . LEFT HEART CATHETERIZATION WITH CORONARY ANGIOGRAM Right 03/07/2013   Procedure: LEFT HEART CATHETERIZATION WITH CORONARY ANGIOGRAM;  Surgeon: [name], MD;  Location: [facility] CATH LAB;   Service: Cardiovascular;  Laterality: Right;  . UMBILICAL HERNIA REPAIR          Home Medications    Prior to Admission medications   Medication Sig Start Date End Date Taking? Authorizing Provider  acetaminophen (TYLENOL) 500 MG tablet Take 1,000 mg by mouth every 6 (six) hours as needed for mild pain.    [provider]  Albuterol Sulfate (PROAIR HFA IN) Inhale 2 puffs into the lungs every 6 (six) hours as needed (for shortness of breath).    [provider]  Cyanocobalamin (VITAMIN B-12 PO) Take 1 tablet by mouth daily.    [provider]  famotidine (PEPCID) 20 MG tablet Take 20 mg by mouth 2 (two) times daily.     [provider]  folic acid (FOLVITE) 1 MG tablet Take 1 tablet (1 mg total) by mouth daily. 11/26/17   [name], MD  lipase/protease/amylase (CREON) 12000 units CPEP capsule Take 1 capsule (12,000 Units total) by mouth 3 (three) times daily with meals. For pancreatitis 10/01/17   Aline August, MD  loratadine (CLARITIN) 10 MG tablet Take 1 tablet (10 mg total) by mouth daily as needed for allergies or rhinitis. (May purchase from over the counter): For allergies 10/01/17   Aline August, MD  metoprolol tartrate (LOPRESSOR) 25 MG tablet Take 1 tablet (25 mg total) by mouth 2 (two) times daily. For high blood pressure 08/29/17   Clent Demark, PA-C  Multiple Vitamin (MULTIVITAMIN WITH MINERALS) TABS tablet Take 1 tablet by mouth daily. 09/06/17   Bonnell Public, MD  omeprazole (PRILOSEC) 20 MG capsule Take 1 capsule (20 mg total) by mouth daily. 03/26/18   Noemi Chapel, MD  ondansetron (ZOFRAN) 4 MG tablet Take 1 tablet (4 mg total) by mouth every 8 (eight) hours as needed for nausea or vomiting. 7/82/95   Delora Fuel, MD  pantoprazole (PROTONIX) 40 MG tablet Take 1 tablet (40 mg total) by mouth 2 (two) times daily. 10/27/28   Delora Fuel, MD  QUEtiapine (SEROQUEL) 100 MG tablet Take 100 mg by mouth at bedtime.    [provider]  sertraline (ZOLOFT) 100 MG tablet Take 1 tablet (100 mg total) by mouth daily. Patient taking differently: Take 150 mg by mouth daily.  08/29/17   Clent Demark, PA-C  sucralfate (CARAFATE) 1 g tablet Take 1 tablet (1 g total) by mouth 4 (four) times daily. Patient taking differently: Take 1 g by mouth 4 (four) times daily -  with meals and at bedtime.  03/13/18   Isla Pence, MD  thiamine 100 MG tablet Take 1 tablet (100 mg total) by mouth daily. Patient not taking: Reported on 03/17/2018 11/26/17   [name], MD    Family History Family History  Problem Relation Age of Onset  . Hypertension Mother   . Cirrhosis Mother   . Alcoholism Mother   . Hypertension Father   . Melanoma Father   . Hypertension Other   .  Coronary artery disease Other     Social History Social History   Tobacco Use  . Smoking status: Current Every Day Smoker    Packs/day: 1.00    Years: 33.00    Pack years: 33.00    Types: Cigarettes, E-cigarettes  . Smokeless tobacco: Never Used  Substance Use Topics  . Alcohol use: Yes  . Drug use: Yes    Types: Marijuana, Cocaine    Comment: daily marijuana use; last cocaine use about 3 months ago     Allergies   Robaxin [methocarbamol]; Shellfish-derived products; Trazodone; Trazodone and nefazodone; Adhesive [tape]; Latex; Toradol [ketorolac tromethamine]; Contrast media [iodinated diagnostic agents]; and Reglan [metoclopramide]   Review of Systems Review of Systems 10 Systems reviewed and are negative for acute change except as noted in the HPI.   Physical Exam Updated Vital Signs BP 104/89   Pulse 85   Resp 14   Wt 63.5 kg   SpO2 100%   BMI 21.29 kg/m   Physical Exam  Constitutional: He is oriented to person, place, and time. He appears well-developed and well-nourished. No distress.  Patient has clinically well appearance.  He is alert and nontoxic.  HENT:  Mouth/Throat: Oropharynx is clear and moist.  Eyes: EOM  are normal.  Cardiovascular: Normal rate, regular rhythm, normal heart sounds and intact distal pulses.  Pulmonary/Chest: Effort normal and breath sounds normal.  Abdominal: Soft. Bowel sounds are normal. He exhibits no distension. There is tenderness.  Patient abdomen is soft.  He reports discomfort to palpation in the epigastric area but there is no guarding and does not appear to be experiencing increasing pain with palpation.  Musculoskeletal: Normal range of motion. He exhibits no edema or tenderness.  Neurological: He is alert and oriented to person, place, and time. He exhibits normal muscle tone. Coordination normal.  Skin: Skin is warm and dry.  Psychiatric: He has a normal mood and affect.     ED Treatments / Results  Labs (all labs ordered are listed, but only abnormal results are displayed) Labs Reviewed  COMPREHENSIVE METABOLIC PANEL - Abnormal; Notable for the following components:      Result Value   CO2 21 (*)    Glucose, Bld 129 (*)    BUN <5 (*)    Creatinine, Ser 0.59 (*)    Calcium 8.6 (*)    AST 55 (*)    ALT 54 (*)    All other components within normal limits  CBC - Abnormal; Notable for the following components:   RBC 3.44 (*)    Hemoglobin 11.6 (*)    HCT 35.4 (*)    MCV 102.9 (*)    All other components within normal limits  LIPASE, BLOOD    EKG EKG Interpretation  Date/Time:  Sunday April 01 2018 08:42:30 EST Ventricular Rate:  79 PR Interval:    QRS Duration: 88 QT Interval:  337 QTC Calculation: 387 R Axis:   74 Text Interpretation:  Sinus rhythm Short PR interval Biatrial enlargement Borderline repolarization abnormality Baseline wander in lead(s) V1 artifact , otherwise no sig change from previous Confirmed by Charlesetta Shanks [id number]) on 04/01/2018 11:47:39 AM   Radiology No results found.  Procedures Procedures (including critical care time)  Medications Ordered in ED Medications  famotidine (PEPCID) IVPB 20 mg premix (0 mg  Intravenous Stopped 04/01/18 1114)  alum & mag hydroxide-simeth (MAALOX/MYLANTA) 200-200-20 MG/5ML suspension 30 mL (30 mLs Oral Given 04/01/18 0950)    And  lidocaine (XYLOCAINE) 2 %  viscous mouth solution 15 mL (15 mLs Oral Given 04/01/18 0950)  sodium chloride 0.9 % bolus 1,000 mL (0 mLs Intravenous Stopped 04/01/18 1114)     Initial Impression / Assessment and Plan / ED Course  I have reviewed the triage vital signs and the nursing notes.  Pertinent labs & imaging results that were available during my care of the patient were reviewed by me and considered in my medical decision making (see chart for details).    Patient is clinically well appearance today.  Labs are stable and vital signs are stable.  On clinical exam he does not appear dehydrated.  This appears consistent with chronic pain exacerbation.  Patient was given Pepcid, fluids and Maalox with viscous lidocaine.  I did explain the patient that we could not use narcotic pain medications for chronic pain management.  He is encouraged to seek follow-up with GI for chronic abdominal pain.  At this point he is safe for discharge with outpatient management.  Final Clinical Impressions(s) / ED Diagnoses   Final diagnoses:  Chronic abdominal pain    ED Discharge Orders    None       Charlesetta Shanks, MD 04/01/18 1218

## 2018-04-01 NOTE — ED Triage Notes (Signed)
Pt in from home via [facility] with c/o epigastric and chest pain. States the pain woke him up at 0730, feels slightly nauseous. EMS unable to give ASA d/t allergy, nor NTG d/t recent Cialisis dose. States pain is sharp, hx of pancreatitis, states last ETOH use was 5 days ago

## 2018-04-06 ENCOUNTER — Emergency Department (HOSPITAL_COMMUNITY)
Admission: EM | Admit: 2018-04-06 | Discharge: 2018-04-06 | Disposition: A | Discharge status: Home / Self Care | Payer: Self-pay | Attending: Emergency Medicine | Admitting: Emergency Medicine

## 2018-04-06 ENCOUNTER — Emergency Department (HOSPITAL_COMMUNITY): Payer: Self-pay

## 2018-04-06 ENCOUNTER — Encounter (HOSPITAL_COMMUNITY): Payer: Self-pay

## 2018-04-06 ENCOUNTER — Other Ambulatory Visit: Payer: Self-pay

## 2018-04-06 DIAGNOSIS — R1013 Epigastric pain: Secondary | ICD-10-CM | POA: Insufficient documentation

## 2018-04-06 LAB — CBC WITH DIFFERENTIAL/PLATELET
Abs Immature Granulocytes: 0.01 10*3/uL (ref 0.00–0.07)
BASOS ABS: 0 10*3/uL (ref 0.0–0.1)
Basophils Relative: 0 %
EOS ABS: 0.2 10*3/uL (ref 0.0–0.5)
Eosinophils Relative: 2 %
HCT: 37.2 % — ABNORMAL LOW (ref 39.0–52.0)
Hemoglobin: 11.9 g/dL — ABNORMAL LOW (ref 13.0–17.0)
Immature Granulocytes: 0 %
Lymphocytes Relative: 31 %
Lymphs Abs: 2.1 10*3/uL (ref 0.7–4.0)
MCH: 33.1 pg (ref 26.0–34.0)
MCHC: 32 g/dL (ref 30.0–36.0)
MCV: 103.6 fL — ABNORMAL HIGH (ref 80.0–100.0)
Monocytes Absolute: 0.7 10*3/uL (ref 0.1–1.0)
Monocytes Relative: 11 %
NRBC: 0 % (ref 0.0–0.2)
Neutro Abs: 3.9 10*3/uL (ref 1.7–7.7)
Neutrophils Relative %: 56 %
Platelets: 228 10*3/uL (ref 150–400)
RBC: 3.59 MIL/uL — AB (ref 4.22–5.81)
RDW: 14.6 % (ref 11.5–15.5)
WBC: 6.9 10*3/uL (ref 4.0–10.5)

## 2018-04-06 LAB — COMPREHENSIVE METABOLIC PANEL
ALK PHOS: 111 U/L (ref 38–126)
ALT: 65 U/L — AB (ref 0–44)
AST: 111 U/L — ABNORMAL HIGH (ref 15–41)
Albumin: 4 g/dL (ref 3.5–5.0)
Anion gap: 13 (ref 5–15)
BUN: 9 mg/dL (ref 6–20)
CALCIUM: 9 mg/dL (ref 8.9–10.3)
CO2: 19 mmol/L — ABNORMAL LOW (ref 22–32)
CREATININE: 0.76 mg/dL (ref 0.61–1.24)
Chloride: 106 mmol/L (ref 98–111)
GFR calc Af Amer: >60 mL/min (ref >60)
Glucose, Bld: 106 mg/dL — ABNORMAL HIGH (ref 70–99)
Potassium: 4.5 mmol/L (ref 3.5–5.1)
Sodium: 138 mmol/L (ref 135–145)
TOTAL PROTEIN: 7.3 g/dL (ref 6.5–8.1)
Total Bilirubin: 0.7 mg/dL (ref 0.3–1.2)

## 2018-04-06 LAB — LIPASE, BLOOD: LIPASE: 43 U/L (ref 11–51)

## 2018-04-06 MED ORDER — FAMOTIDINE 20 MG PO TABS
20.0000 mg | ORAL_TABLET | Freq: Once | ORAL | Status: AC
  Administered 2018-04-06: 20 mg via ORAL
  Filled 2018-04-06: qty 1

## 2018-04-06 MED ORDER — ALUM & MAG HYDROXIDE-SIMETH 200-200-20 MG/5ML PO SUSP
30.0000 mL | Freq: Once | ORAL | Status: AC
  Administered 2018-04-06: 30 mL via ORAL
  Filled 2018-04-06: qty 30

## 2018-04-06 MED ORDER — FAMOTIDINE 20 MG PO TABS: 20.0000 mg | ORAL_TABLET | Freq: Two times a day (BID) | ORAL | 0 refills | Status: DC

## 2018-04-06 MED ORDER — ACETAMINOPHEN 325 MG PO TABS
650.0000 mg | ORAL_TABLET | Freq: Once | ORAL | Status: AC
  Administered 2018-04-06: 650 mg via ORAL
  Filled 2018-04-06: qty 2

## 2018-04-06 NOTE — Discharge Instructions (Addendum)
Avoid alcohol and tobacco.  Alcohol and tobacco contribute to ulcers.  Alcohol causes flareups of pancreatitis.  If you have a drug problem call any of the numbers on the resource guide to get help.  You can take Maalox 2 tablespoons after meals and at bedtime.  You should avoid further doses of Tylenol as Tylenol can harm your liver.  Get your blood pressure rechecked within a week.  Today's was elevated at 162/107

## 2018-04-06 NOTE — ED Triage Notes (Signed)
Per [facility] pt is complaining of abdominal that radiates into his chest pain. Pt had one episode of vomiting prior to EMS arrival on scene. Pt was given 6mg  of morphine by EMS. Pt does have a past medical history of WPW.

## 2018-04-06 NOTE — ED Provider Notes (Signed)
[facility] EMERGENCY DEPARTMENT Provider Note   CSN: [id number] Arrival date & time: 04/06/18  1109     History   Chief Complaint Chief Complaint  Patient presents with  . Abdominal Pain    HPI [name] is a 47 y.o. male.  HPI complains of epigastric abdominal pain onset 2 hours ago followed by one episode of vomiting.  He developed anterior chest pain immediately after vomiting.  Pain feels like pancreatitis or pain from peptic ulcer disease she had in the past.  He ran out of his Pepcid several days ago.  He denies hematemesis denies fever denies shortness of breath.  Denies other associated symptoms.  Last bowel movement yesterday, normal.  Pain is constant.  Nothing makes symptoms better or worse.  No other associated symptoms.  He last drank alcohol 1 week ago.  Last used cocaine a few weeks ago.  Last used marijuana approximately 1 week ago.  No history of IV drug use.  Treated with Tylenol, without relief  Past Medical History:  Diagnosis Date  . Alcoholism /alcohol abuse ([place])   . Anemia   . Anxiety   . Arthritis    "knees; arms; elbows" (03/26/2015)  . Asthma   . Bipolar disorder ([place])   . Chronic bronchitis ([place])   . Chronic lower back pain   . Chronic pancreatitis ([place])   . Cocaine abuse ([place])   . Depression   . Family history of adverse reaction to anesthesia    "grandmother gets confused"  . Femoral condyle fracture ([place]) 03/08/2014   left medial/notes 03/09/2014  . GERD (gastroesophageal reflux disease)   . H/O hiatal hernia   . H/O suicide attempt 10/2012  . Heart murmur    "when he was little" (03/06/2013)  . High cholesterol   . History of blood transfusion 10/2012   "when I tried to commit suicide"  . History of stomach ulcers   . Hypertension   . Marijuana abuse, continuous   . Migraine    "a few times/year" (03/26/2015)  . Pneumonia 1990's X 3  . PTSD (post-traumatic stress disorder)   . Shortness of breath    "can happen at anytime" (03/06/2013)  . Sickle cell trait ([place])   . WPW (Wolff-Parkinson-White syndrome)    [name] 03/06/2013    Patient Active Problem List   Diagnosis Date Noted  . Gastritis and gastroduodenitis   . Chest pain 01/08/2018  . GI bleed 11/24/2017  . Acute blood loss anemia 11/24/2017  . Atypical chest pain 11/24/2017  . Acute pancreatitis 09/28/2017  . Abdominal pain 05/27/2017  . Hematemesis 05/27/2017  . Tachycardia 03/18/2017  . Diarrhea 03/18/2017  . Acute on chronic pancreatitis ([place]) 12/17/2016  . Intractable nausea and vomiting 12/05/2016  . Verbally abusive behavior 12/05/2016  . Normocytic anemia 12/05/2016  . Alcohol use disorder, severe, dependence ([place]) 07/25/2016  . Cocaine use disorder, severe, dependence ([place]) 07/25/2016  . 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 Leukocytosis   . Hospital acquired PNA 05/20/2015  . Chronic pancreatitis ([place]) 05/18/2015  . Pseudocyst of pancreas 05/18/2015  . Polysubstance abuse (tobacco, cocaine, THC, and ETOH) 03/26/2015  . Alcohol-induced chronic pancreatitis ([place])   . Benign essential HTN 02/06/2014  . Alcohol-induced acute pancreatitis 11/28/2013  . Pancreatic pseudocyst/cyst 11/25/2013  . Severe protein-calorie malnutrition ([place]) 10/10/2013  . Suicide attempt ([place]) 10/08/2013  . 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 TOBACCO ABUSE 03/23/2007  Past Surgical History:  Procedure Laterality Date  . BIOPSY  11/25/2017   Procedure: BIOPSY;  Surgeon: Arta Silence, MD;  Location: [facility];  Service: Endoscopy;;  . CARDIAC CATHETERIZATION    . ESOPHAGOGASTRODUODENOSCOPY (EGD) WITH PROPOFOL N/A 11/25/2017   Procedure: ESOPHAGOGASTRODUODENOSCOPY (EGD) WITH PROPOFOL;  Surgeon: Arta Silence, MD;  Location: [facility];  Service: Endoscopy;  Laterality: N/A;  . EYE SURGERY Left 1990's   "result of trauma"   . FACIAL  FRACTURE SURGERY Left 1990's   "result of trauma"   . FRACTURE SURGERY    . HERNIA REPAIR    . LEFT HEART CATHETERIZATION WITH CORONARY ANGIOGRAM Right 03/07/2013   Procedure: LEFT HEART CATHETERIZATION WITH CORONARY ANGIOGRAM;  Surgeon: [name], MD;  Location: [facility];  Service: Cardiovascular;  Laterality: Right;  . UMBILICAL HERNIA REPAIR          Home Medications    Prior to Admission medications   Medication Sig Start Date End Date Taking? Authorizing Provider  acetaminophen (TYLENOL) 500 MG tablet Take 1,000 mg by mouth every 6 (six) hours as needed for mild pain.    [provider]  Albuterol Sulfate (PROAIR HFA IN) Inhale 2 puffs into the lungs every 6 (six) hours as needed (for shortness of breath).    [provider]  Cyanocobalamin (VITAMIN B-12 PO) Take 1 tablet by mouth daily.    [provider]  famotidine (PEPCID) 20 MG tablet Take 1 tablet (20 mg total) by mouth 2 (two) times daily. 04/06/18   Orlie Dakin, MD  folic acid (FOLVITE) 1 MG tablet Take 1 tablet (1 mg total) by mouth daily. 11/26/17   [name], MD  lipase/protease/amylase (CREON) 12000 units CPEP capsule Take 1 capsule (12,000 Units total) by mouth 3 (three) times daily with meals. For pancreatitis 10/01/17   Aline August, MD  loratadine (CLARITIN) 10 MG tablet Take 1 tablet (10 mg total) by mouth daily as needed for allergies or rhinitis. (May purchase from over the counter): For allergies 10/01/17   Aline August, MD  metoprolol tartrate (LOPRESSOR) 25 MG tablet Take 1 tablet (25 mg total) by mouth 2 (two) times daily. For high blood pressure 08/29/17   Clent Demark, PA-C  Multiple Vitamin (MULTIVITAMIN WITH MINERALS) TABS tablet Take 1 tablet by mouth daily. 09/06/17   Bonnell Public, MD  omeprazole (PRILOSEC) 20 MG capsule Take 1 capsule (20 mg total) by mouth daily. 03/26/18   Noemi Chapel, MD  ondansetron (ZOFRAN) 4 MG tablet Take 1 tablet (4 mg  total) by mouth every 8 (eight) hours as needed for nausea or vomiting. 7/62/26   Delora Fuel, MD  pantoprazole (PROTONIX) 40 MG tablet Take 1 tablet (40 mg total) by mouth 2 (two) times daily. 3/33/54   Delora Fuel, MD  QUEtiapine (SEROQUEL) 100 MG tablet Take 100 mg by mouth at bedtime.    [provider]  sertraline (ZOLOFT) 100 MG tablet Take 1 tablet (100 mg total) by mouth daily. Patient taking differently: Take 150 mg by mouth daily.  08/29/17   Clent Demark, PA-C  sucralfate (CARAFATE) 1 g tablet Take 1 tablet (1 g total) by mouth 4 (four) times daily. Patient taking differently: Take 1 g by mouth 4 (four) times daily -  with meals and at bedtime.  03/13/18   Isla Pence, MD  thiamine 100 MG tablet Take 1 tablet (100 mg total) by mouth daily. Patient not taking: Reported on 03/17/2018 11/26/17   Candiss Norse,  Margaree Mackintosh, MD    Family History Family History  Problem Relation Age of Onset  . Hypertension Mother   . Cirrhosis Mother   . Alcoholism Mother   . Hypertension Father   . Melanoma Father   . Hypertension Other   . Coronary artery disease Other     Social History Social History   Tobacco Use  . Smoking status: Current Every Day Smoker    Packs/day: 1.00    Years: 33.00    Pack years: 33.00    Types: Cigarettes, E-cigarettes  . Smokeless tobacco: Never Used  Substance Use Topics  . Alcohol use: Yes  . Drug use: Yes    Types: Marijuana, Cocaine    Comment: daily marijuana use; last cocaine use about 3 months ago     Allergies   Robaxin [methocarbamol]; Shellfish-derived products; Trazodone; Trazodone and nefazodone; Adhesive [tape]; Latex; Toradol [ketorolac tromethamine]; Contrast media [iodinated diagnostic agents]; and Reglan [metoclopramide]   Review of Systems Review of Systems  Constitutional: Negative.   HENT: Negative.   Respiratory: Negative.   Cardiovascular: Positive for chest pain.       Syncope  Gastrointestinal: Positive for  abdominal pain and vomiting.  Musculoskeletal: Negative.   Skin: Negative.   Allergic/Immunologic: Negative.   Neurological: Negative.   Psychiatric/Behavioral: Negative.   All other systems reviewed and are negative.    Physical Exam Updated Vital Signs BP (!) 162/100 (BP Location: Right Arm)   Pulse 89   Temp 98 F (36.7 C) (Oral)   Resp 20   Ht 5\' 8"  (1.727 m)   Wt 63.5 kg   SpO2 99%   BMI 21.29 kg/m   Physical Exam  Constitutional: He appears well-developed and well-nourished.  HENT:  Head: Normocephalic and atraumatic.  Eyes: Pupils are equal, round, and reactive to light. Conjunctivae are normal.  Neck: Neck supple. No tracheal deviation present. No thyromegaly present.  Cardiovascular: Normal rate and regular rhythm.  No murmur heard. Pulmonary/Chest: Effort normal and breath sounds normal.  Abdominal: Soft. Bowel sounds are normal. He exhibits no distension. There is tenderness.  Tender at epigastrium  Musculoskeletal: Normal range of motion. He exhibits no edema or tenderness.  Neurological: He is alert. Coordination normal.  Skin: Skin is warm and dry. No rash noted.  Psychiatric: He has a normal mood and affect.  Nursing note and vitals reviewed.    ED Treatments / Results  Labs (all labs ordered are listed, but only abnormal results are displayed) Labs Reviewed  COMPREHENSIVE METABOLIC PANEL - Abnormal; Notable for the following components:      Result Value   CO2 19 (*)    Glucose, Bld 106 (*)    AST 111 (*)    ALT 65 (*)    All other components within normal limits  CBC WITH DIFFERENTIAL/PLATELET - Abnormal; Notable for the following components:   RBC 3.59 (*)    Hemoglobin 11.9 (*)    HCT 37.2 (*)    MCV 103.6 (*)    All other components within normal limits  LIPASE, BLOOD   Results for orders placed or performed during the hospital encounter of 04/06/18  Comprehensive metabolic panel  Result Value Ref Range   Sodium 138 135 - 145 mmol/L     Potassium 4.5 3.5 - 5.1 mmol/L   Chloride 106 98 - 111 mmol/L   CO2 19 (L) 22 - 32 mmol/L   Glucose, Bld 106 (H) 70 - 99 mg/dL   BUN 9 6 -  20 mg/dL   Creatinine, Ser 0.76 0.61 - 1.24 mg/dL   Calcium 9.0 8.9 - 10.3 mg/dL   Total Protein 7.3 6.5 - 8.1 g/dL   Albumin 4.0 3.5 - 5.0 g/dL   AST 111 (H) 15 - 41 U/L   ALT 65 (H) 0 - 44 U/L   Alkaline Phosphatase 111 38 - 126 U/L   Total Bilirubin 0.7 0.3 - 1.2 mg/dL   GFR calc non Af Amer >60 >60 mL/min   GFR calc Af Amer >60 >60 mL/min   Anion gap 13 5 - 15  CBC with Differential/Platelet  Result Value Ref Range   WBC 6.9 4.0 - 10.5 K/uL   RBC 3.59 (L) 4.22 - 5.81 MIL/uL   Hemoglobin 11.9 (L) 13.0 - 17.0 g/dL   HCT 37.2 (L) 39.0 - 52.0 %   MCV 103.6 (H) 80.0 - 100.0 fL   MCH 33.1 26.0 - 34.0 pg   MCHC 32.0 30.0 - 36.0 g/dL   RDW 14.6 11.5 - 15.5 %   Platelets 228 150 - 400 K/uL   nRBC 0.0 0.0 - 0.2 %   Neutrophils Relative % 56 %   Neutro Abs 3.9 1.7 - 7.7 K/uL   Lymphocytes Relative 31 %   Lymphs Abs 2.1 0.7 - 4.0 K/uL   Monocytes Relative 11 %   Monocytes Absolute 0.7 0.1 - 1.0 K/uL   Eosinophils Relative 2 %   Eosinophils Absolute 0.2 0.0 - 0.5 K/uL   Basophils Relative 0 %   Basophils Absolute 0.0 0.0 - 0.1 K/uL   Immature Granulocytes 0 %   Abs Immature Granulocytes 0.01 0.00 - 0.07 K/uL  Lipase, blood  Result Value Ref Range   Lipase 43 11 - 51 U/L   Dg Chest 2 View  Result Date: 04/06/2018 CLINICAL DATA:  Chest pain.  History of pancreatitis EXAM: CHEST - 2 VIEW COMPARISON:  March 23, 2018 FINDINGS: The lungs are clear. The heart size and pulmonary vascularity are normal. No adenopathy. No pneumothorax or pneumomediastinum. No bone lesions. IMPRESSION: No edema or consolidation. Electronically Signed   By: Lowella Grip III M.D.   On: 04/06/2018 12:02   Dg Chest 2 View  Result Date: 03/23/2018 CLINICAL DATA:  Central chest pain EXAM: CHEST - 2 VIEW COMPARISON:  03/17/2018 FINDINGS: Lungs are clear.  No  pleural effusion or pneumothorax. The heart is normal in size. Visualized osseous structures are within normal limits. IMPRESSION: Normal chest radiographs. Electronically Signed   By: [name] M.D.   On: 03/23/2018 18:33   Dg Chest 2 View  Result Date: 03/17/2018 CLINICAL DATA:  Chest tightness and pain for 2 weeks, shortness of breath with exertion. EXAM: CHEST - 2 VIEW COMPARISON:  Chest x-ray dated 03/03/2018. FINDINGS: The heart size and mediastinal contours are within normal limits. Both lungs are clear. The visualized skeletal structures are unremarkable. IMPRESSION: No active cardiopulmonary disease. No evidence of pneumonia or pulmonary edema. Electronically Signed   By: Franki Cabot M.D.   On: 03/17/2018 23:44   EKG EKG Interpretation  Date/Time:  Friday April 06 2018 11:16:12 EST Ventricular Rate:  107 PR Interval:    QRS Duration: 72 QT Interval:  289 QTC Calculation: 386 R Axis:   80 Text Interpretation:  Sinus tachycardia Biatrial enlargement Borderline ST depression, inferior leads Abnormal T, consider ischemia, diffuse leads No significant change since last tracing Confirmed by Orlie Dakin [phone]) on 04/06/2018 11:28:29 AM   Radiology Dg Chest 2 View  Result  Date: 04/06/2018 CLINICAL DATA:  Chest pain.  History of pancreatitis EXAM: CHEST - 2 VIEW COMPARISON:  March 23, 2018 FINDINGS: The lungs are clear. The heart size and pulmonary vascularity are normal. No adenopathy. No pneumothorax or pneumomediastinum. No bone lesions. IMPRESSION: No edema or consolidation. Electronically Signed   By: Lowella Grip III M.D.   On: 04/06/2018 12:02   Chest x-ray viewed by me Procedures Procedures (including critical care time)  Medications Ordered in ED Medications  famotidine (PEPCID) tablet 20 mg (20 mg Oral Given 04/06/18 1145)  alum & mag hydroxide-simeth (MAALOX/MYLANTA) 200-200-20 MG/5ML suspension 30 mL (30 mLs Oral Given 04/06/18 1413)    acetaminophen (TYLENOL) tablet 650 mg (650 mg Oral Given 04/06/18 1447)     Initial Impression / Assessment and Plan / ED Course  I have reviewed the triage vital signs and the nursing notes.  Pertinent labs & imaging results that were available during my care of the patient were reviewed by me and considered in my medical decision making (see chart for details).     Pain improved after treatment with Maalox and Pepcid.  Plan prescription Pepcid.  Referral to resource guide and to PCP.  I counseled patient for 5 minutes on smoking cessation.  Also advised to avoid alcohol and further doses of Tylenol as LFTs mildly elevated. Suspect epigastric pain secondary to peptic ulcer disease versus gastritis suggest blood pressure recheck 1 week with lab work consistent with mildly elevated LFTs Final Clinical Impressions(s) / ED Diagnoses  Diagnoses #1epi gastric abdominal pain #2 substance abuse #3 tobacco abuse #4 elevated blood pressure Final diagnoses:  Epigastric abdominal pain    ED Discharge Orders         Ordered    famotidine (PEPCID) 20 MG tablet  2 times daily     04/06/18 1449           Orlie Dakin, MD 04/06/18 1644

## 2018-04-06 NOTE — ED Notes (Signed)
Patient transported to X-ray 

## 2018-05-30 ENCOUNTER — Other Ambulatory Visit: Payer: Self-pay

## 2018-05-30 ENCOUNTER — Emergency Department (HOSPITAL_COMMUNITY)
Admission: EM | Admit: 2018-05-30 | Discharge: 2018-05-30 | Disposition: A | Discharge status: Home / Self Care | Payer: Self-pay | Attending: Emergency Medicine | Admitting: Emergency Medicine

## 2018-05-30 ENCOUNTER — Emergency Department (HOSPITAL_COMMUNITY): Payer: Self-pay

## 2018-05-30 ENCOUNTER — Encounter (HOSPITAL_COMMUNITY): Payer: Self-pay | Admitting: Emergency Medicine

## 2018-05-30 DIAGNOSIS — R1084 Generalized abdominal pain: Secondary | ICD-10-CM | POA: Insufficient documentation

## 2018-05-30 DIAGNOSIS — J45909 Unspecified asthma, uncomplicated: Secondary | ICD-10-CM | POA: Insufficient documentation

## 2018-05-30 DIAGNOSIS — Z79899 Other long term (current) drug therapy: Secondary | ICD-10-CM | POA: Insufficient documentation

## 2018-05-30 DIAGNOSIS — R079 Chest pain, unspecified: Secondary | ICD-10-CM | POA: Insufficient documentation

## 2018-05-30 DIAGNOSIS — Z91040 Latex allergy status: Secondary | ICD-10-CM | POA: Insufficient documentation

## 2018-05-30 DIAGNOSIS — I10 Essential (primary) hypertension: Secondary | ICD-10-CM | POA: Insufficient documentation

## 2018-05-30 DIAGNOSIS — F17210 Nicotine dependence, cigarettes, uncomplicated: Secondary | ICD-10-CM | POA: Insufficient documentation

## 2018-05-30 LAB — BASIC METABOLIC PANEL
Anion gap: 10 (ref 5–15)
BUN: 5 mg/dL — ABNORMAL LOW (ref 6–20)
CO2: 21 mmol/L — ABNORMAL LOW (ref 22–32)
Calcium: 8.8 mg/dL — ABNORMAL LOW (ref 8.9–10.3)
Chloride: 107 mmol/L (ref 98–111)
Creatinine, Ser: 0.65 mg/dL (ref 0.61–1.24)
GFR calc Af Amer: >60 mL/min (ref >60)
GLUCOSE: 100 mg/dL — AB (ref 70–99)
Potassium: 4.1 mmol/L (ref 3.5–5.1)
Sodium: 138 mmol/L (ref 135–145)

## 2018-05-30 LAB — CBC
HCT: 35.6 % — ABNORMAL LOW (ref 39.0–52.0)
Hemoglobin: 11.7 g/dL — ABNORMAL LOW (ref 13.0–17.0)
MCH: 33.6 pg (ref 26.0–34.0)
MCHC: 32.9 g/dL (ref 30.0–36.0)
MCV: 102.3 fL — ABNORMAL HIGH (ref 80.0–100.0)
Platelets: 197 10*3/uL (ref 150–400)
RBC: 3.48 MIL/uL — ABNORMAL LOW (ref 4.22–5.81)
RDW: 14.4 % (ref 11.5–15.5)
WBC: 9.4 10*3/uL (ref 4.0–10.5)
nRBC: 0 % (ref 0.0–0.2)

## 2018-05-30 LAB — I-STAT TROPONIN, ED: Troponin i, poc: 0 ng/mL (ref 0.00–0.08)

## 2018-05-30 LAB — LIPASE, BLOOD: Lipase: 28 U/L (ref 11–51)

## 2018-05-30 MED ORDER — LIDOCAINE VISCOUS HCL 2 % MT SOLN
15.0000 mL | Freq: Once | OROMUCOSAL | Status: AC
  Administered 2018-05-30: 15 mL via ORAL
  Filled 2018-05-30: qty 15

## 2018-05-30 MED ORDER — ALUM & MAG HYDROXIDE-SIMETH 200-200-20 MG/5ML PO SUSP
30.0000 mL | Freq: Once | ORAL | Status: AC
  Administered 2018-05-30: 30 mL via ORAL
  Filled 2018-05-30: qty 30

## 2018-05-30 MED ORDER — DICYCLOMINE HCL 10 MG PO CAPS
10.0000 mg | ORAL_CAPSULE | Freq: Once | ORAL | Status: AC
  Administered 2018-05-30: 10 mg via ORAL
  Filled 2018-05-30: qty 1

## 2018-05-30 MED ORDER — SODIUM CHLORIDE 0.9% FLUSH: 3.0000 mL | Freq: Once | INTRAVENOUS | Status: DC

## 2018-05-30 MED ORDER — MORPHINE SULFATE (PF) 4 MG/ML IV SOLN
4.0000 mg | Freq: Once | INTRAVENOUS | Status: AC
  Administered 2018-05-30: 4 mg via INTRAMUSCULAR
  Filled 2018-05-30: qty 1

## 2018-05-30 NOTE — ED Notes (Signed)
Pt reminded of the need for urine.  

## 2018-05-30 NOTE — ED Provider Notes (Signed)
[facility] EMERGENCY DEPARTMENT Provider Note   CSN: [id number] Arrival date & time: 05/30/18  1824     History   Chief Complaint Chief Complaint  Patient presents with  . Chest Pain  . Abdominal Pain    HPI [name] is a 48 y.o. male.  Patient is a 48 year old male with history of gastritis, ulcers, chronic pancreatitis, alcohol and polysubstance abuse.  He presents today for evaluation of abdominal pain and chest pain.  This started yesterday and is worsening.  Patient denies any fevers or chills.  He denies any difficulty breathing.  Patient believes that his pancreatitis may be flaring up.  Patient is very well-known to the emergency department for many, many ER visits with similar presentations.  The history is provided by the patient.  Chest Pain  Pain location:  Epigastric and substernal area Pain quality: burning   Pain radiates to:  Does not radiate Pain severity:  Moderate Onset quality:  Gradual Timing:  Constant Progression:  Worsening Chronicity:  Chronic Relieved by:  Nothing Worsened by:  Nothing Ineffective treatments:  None tried Associated symptoms: abdominal pain   Abdominal Pain  Associated symptoms: chest pain     Past Medical History:  Diagnosis Date  . Alcoholism /alcohol abuse ([place])   . Anemia   . Anxiety   . Arthritis    "knees; arms; elbows" (03/26/2015)  . Asthma   . Bipolar disorder ([place])   . Chronic bronchitis ([place])   . Chronic lower back pain   . Chronic pancreatitis ([place])   . Cocaine abuse ([place])   . Depression   . Family history of adverse reaction to anesthesia    "grandmother gets confused"  . Femoral condyle fracture ([place]) 03/08/2014   left medial/notes 03/09/2014  . GERD (gastroesophageal reflux disease)   . H/O hiatal hernia   . H/O suicide attempt 10/2012  . Heart murmur    "when he was little" (03/06/2013)  . High cholesterol   . History of blood transfusion 10/2012   "when I tried  to commit suicide"  . History of stomach ulcers   . Hypertension   . Marijuana abuse, continuous   . Migraine    "a few times/year" (03/26/2015)  . Pneumonia 1990's X 3  . PTSD (post-traumatic stress disorder)   . Shortness of breath    "can happen at anytime" (03/06/2013)  . Sickle cell trait ([place])   . WPW (Wolff-Parkinson-White syndrome)    [name] 03/06/2013    Patient Active Problem List   Diagnosis Date Noted  . Gastritis and gastroduodenitis   . Chest pain 01/08/2018  . GI bleed 11/24/2017  . Acute blood loss anemia 11/24/2017  . Atypical chest pain 11/24/2017  . Acute pancreatitis 09/28/2017  . Abdominal pain 05/27/2017  . Hematemesis 05/27/2017  . Tachycardia 03/18/2017  . Diarrhea 03/18/2017  . Acute on chronic pancreatitis ([place]) 12/17/2016  . Intractable nausea and vomiting 12/05/2016  . Verbally abusive behavior 12/05/2016  . Normocytic anemia 12/05/2016  . Alcohol use disorder, severe, dependence ([place]) 07/25/2016  . Cocaine use disorder, severe, dependence ([place]) 07/25/2016  . 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 Leukocytosis   . Hospital acquired PNA 05/20/2015  . Chronic pancreatitis ([place]) 05/18/2015  . Pseudocyst of pancreas 05/18/2015  . Polysubstance abuse (tobacco, cocaine, THC, and ETOH) 03/26/2015  . Alcohol-induced chronic pancreatitis ([place])   . Benign essential HTN 02/06/2014  . Alcohol-induced acute pancreatitis 11/28/2013  . Pancreatic  pseudocyst/cyst 11/25/2013  . Severe protein-calorie malnutrition ([place]) 10/10/2013  . Suicide attempt ([place]) 10/08/2013  . 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 TOBACCO ABUSE 03/23/2007    Past Surgical History:  Procedure Laterality Date  . BIOPSY  11/25/2017   Procedure: BIOPSY;  Surgeon: Arta Silence, MD;  Location: [facility];  Service: Endoscopy;;  . CARDIAC CATHETERIZATION    . ESOPHAGOGASTRODUODENOSCOPY (EGD) WITH  PROPOFOL N/A 11/25/2017   Procedure: ESOPHAGOGASTRODUODENOSCOPY (EGD) WITH PROPOFOL;  Surgeon: Arta Silence, MD;  Location: [facility];  Service: Endoscopy;  Laterality: N/A;  . EYE SURGERY Left 1990's   "result of trauma"   . FACIAL FRACTURE SURGERY Left 1990's   "result of trauma"   . FRACTURE SURGERY    . HERNIA REPAIR    . LEFT HEART CATHETERIZATION WITH CORONARY ANGIOGRAM Right 03/07/2013   Procedure: LEFT HEART CATHETERIZATION WITH CORONARY ANGIOGRAM;  Surgeon: [name], MD;  Location: [facility] CATH [facility];  Service: Cardiovascular;  Laterality: Right;  . UMBILICAL HERNIA REPAIR          Home Medications    Prior to Admission medications   Medication Sig Start Date End Date Taking? Authorizing Provider  acetaminophen (TYLENOL) 500 MG tablet Take 1,000 mg by mouth every 6 (six) hours as needed for mild pain.    [provider]  Albuterol Sulfate (PROAIR HFA IN) Inhale 2 puffs into the lungs every 6 (six) hours as needed (for shortness of breath).    [provider]  Cyanocobalamin (VITAMIN B-12 PO) Take 1 tablet by mouth daily.    [provider]  famotidine (PEPCID) 20 MG tablet Take 1 tablet (20 mg total) by mouth 2 (two) times daily. 04/06/18   Orlie Dakin, MD  folic acid (FOLVITE) 1 MG tablet Take 1 tablet (1 mg total) by mouth daily. 11/26/17   [name], MD  lipase/protease/amylase (CREON) 12000 units CPEP capsule Take 1 capsule (12,000 Units total) by mouth 3 (three) times daily with meals. For pancreatitis 10/01/17   Aline August, MD  loratadine (CLARITIN) 10 MG tablet Take 1 tablet (10 mg total) by mouth daily as needed for allergies or rhinitis. (May purchase from over the counter): For allergies 10/01/17   Aline August, MD  metoprolol tartrate (LOPRESSOR) 25 MG tablet Take 1 tablet (25 mg total) by mouth 2 (two) times daily. For high blood pressure 08/29/17   Clent Demark, PA-C  Multiple Vitamin (MULTIVITAMIN WITH MINERALS)  TABS tablet Take 1 tablet by mouth daily. 09/06/17   Bonnell Public, MD  omeprazole (PRILOSEC) 20 MG capsule Take 1 capsule (20 mg total) by mouth daily. 03/26/18   Noemi Chapel, MD  ondansetron (ZOFRAN) 4 MG tablet Take 1 tablet (4 mg total) by mouth every 8 (eight) hours as needed for nausea or vomiting. 08/25/60   Delora Fuel, MD  pantoprazole (PROTONIX) 40 MG tablet Take 1 tablet (40 mg total) by mouth 2 (two) times daily. 2/29/79   Delora Fuel, MD  QUEtiapine (SEROQUEL) 100 MG tablet Take 100 mg by mouth at bedtime.    [provider]  sertraline (ZOLOFT) 100 MG tablet Take 1 tablet (100 mg total) by mouth daily. Patient taking differently: Take 150 mg by mouth daily.  08/29/17   Clent Demark, PA-C  sucralfate (CARAFATE) 1 g tablet Take 1 tablet (1 g total) by mouth 4 (four) times daily. Patient taking differently: Take 1 g by mouth 4 (four) times daily -  with meals and  at bedtime.  03/13/18   Isla Pence, MD  thiamine 100 MG tablet Take 1 tablet (100 mg total) by mouth daily. Patient not taking: Reported on 03/17/2018 11/26/17   [name], MD    Family History Family History  Problem Relation Age of Onset  . Hypertension Mother   . Cirrhosis Mother   . Alcoholism Mother   . Hypertension Father   . Melanoma Father   . Hypertension Other   . Coronary artery disease Other     Social History Social History   Tobacco Use  . Smoking status: Current Every Day Smoker    Packs/day: 1.00    Years: 33.00    Pack years: 33.00    Types: Cigarettes, E-cigarettes  . Smokeless tobacco: Never Used  Substance Use Topics  . Alcohol use: Yes  . Drug use: Yes    Types: Marijuana, Cocaine    Comment: daily marijuana use; last cocaine use about 3 months ago     Allergies   Robaxin [methocarbamol]; Shellfish-derived products; Trazodone; Trazodone and nefazodone; Adhesive [tape]; Latex; Toradol [ketorolac tromethamine]; Contrast media [iodinated diagnostic  agents]; and Reglan [metoclopramide]   Review of Systems Review of Systems  Cardiovascular: Positive for chest pain.  Gastrointestinal: Positive for abdominal pain.  All other systems reviewed and are negative.    Physical Exam Updated Vital Signs BP (!) 170/102   Pulse (!) 101   Temp 98.1 F (36.7 C) (Oral)   Resp 18   SpO2 100%   Physical Exam Vitals signs and nursing note reviewed.  Constitutional:      General: He is not in acute distress.    Appearance: He is well-developed. He is not diaphoretic.  HENT:     Head: Normocephalic and atraumatic.  Neck:     Musculoskeletal: Normal range of motion and neck supple.  Cardiovascular:     Rate and Rhythm: Normal rate and regular rhythm.     Heart sounds: No murmur. No friction rub.  Pulmonary:     Effort: Pulmonary effort is normal. No respiratory distress.     Breath sounds: Normal breath sounds. No wheezing or rales.  Abdominal:     General: Bowel sounds are normal. There is no distension.     Palpations: Abdomen is soft.     Tenderness: There is abdominal tenderness. There is no guarding or rebound.  Musculoskeletal: Normal range of motion.  Skin:    General: Skin is warm and dry.  Neurological:     Mental Status: He is alert and oriented to person, place, and time.     Coordination: Coordination normal.      ED Treatments / Results  Labs (all labs ordered are listed, but only abnormal results are displayed) Labs Reviewed  BASIC METABOLIC PANEL - Abnormal; Notable for the following components:      Result Value   CO2 21 (*)    Glucose, Bld 100 (*)    BUN 5 (*)    Calcium 8.8 (*)    All other components within normal limits  CBC - Abnormal; Notable for the following components:   RBC 3.48 (*)    Hemoglobin 11.7 (*)    HCT 35.6 (*)    MCV 102.3 (*)    All other components within normal limits  LIPASE, BLOOD  URINALYSIS, ROUTINE W REFLEX MICROSCOPIC  I-STAT TROPONIN, ED    EKG EKG  Interpretation  Date/Time:  Wednesday May 30 2018 18:31:29 EST Ventricular Rate:  83 PR Interval:  110 QRS Duration: 76 QT Interval:  340 QTC Calculation: 399 R Axis:   77 Text Interpretation:  Sinus rhythm with marked sinus arrhythmia with short PR Biatrial enlargement Nonspecific T wave abnormality Abnormal ECG Confirmed by Veryl Speak [phone]) on 05/30/2018 7:52:34 PM   Radiology No results found.  Procedures Procedures (including critical care time)  Medications Ordered in ED Medications  sodium chloride flush (NS) 0.9 % injection 3 mL (has no administration in time range)  alum & mag hydroxide-simeth (MAALOX/MYLANTA) 200-200-20 MG/5ML suspension 30 mL (has no administration in time range)    And  lidocaine (XYLOCAINE) 2 % viscous mouth solution 15 mL (has no administration in time range)     Initial Impression / Assessment and Plan / ED Course  I have reviewed the triage vital signs and the nursing notes.  Pertinent labs & imaging results that were available during my care of the patient were reviewed by me and considered in my medical decision making (see chart for details).  Patient with chronic abdominal pain presenting with abdominal pain.  Patient well-known to the emergency department.  He is not feeling better after GI cocktail.  He will be given IM morphine and Bentyl.  He will be discharged and is to follow-up with GI.  Nothing today appears acute.  His laboratory studies are reassuring.  Lipase is normal.  Final Clinical Impressions(s) / ED Diagnoses   Final diagnoses:  None    ED Discharge Orders    None       Veryl Speak, MD 05/30/18 [id number]

## 2018-05-30 NOTE — Discharge Instructions (Addendum)
Continue medications as previously prescribed.  Follow up with gastroenterology.  The contact information for Drs. Mann/Hung has been provided in this discharge summary for you to call and make these arrangements.

## 2018-05-30 NOTE — ED Notes (Signed)
Pt alert and oriented in NAD. Pt verbalized understanding of discharge instructions. 

## 2018-05-30 NOTE — ED Triage Notes (Signed)
Pt reports abdominal pain since 3 PM today. Pt reports the pain radiated into his chest and jaw about 45 minutes ago. Pt reports the pain gets worse when moving his R arm. Pt reports nausea, 1 episode of emesis today. Pt reports some dizziness also. Pt reports hx of HTN and pancreatitis.

## 2018-05-31 IMAGING — DX DG RIBS W/ CHEST 3+V*L*
3 series · 3 of 3 positions shown · non-contrast
Comparison: 06/08/2015

CLINICAL DATA: Fall last night, hit a stool with left side of
chest. Mid to upper left side chest pain.

EXAM:
LEFT RIBS AND CHEST - 3+ VIEW

[chest pa]
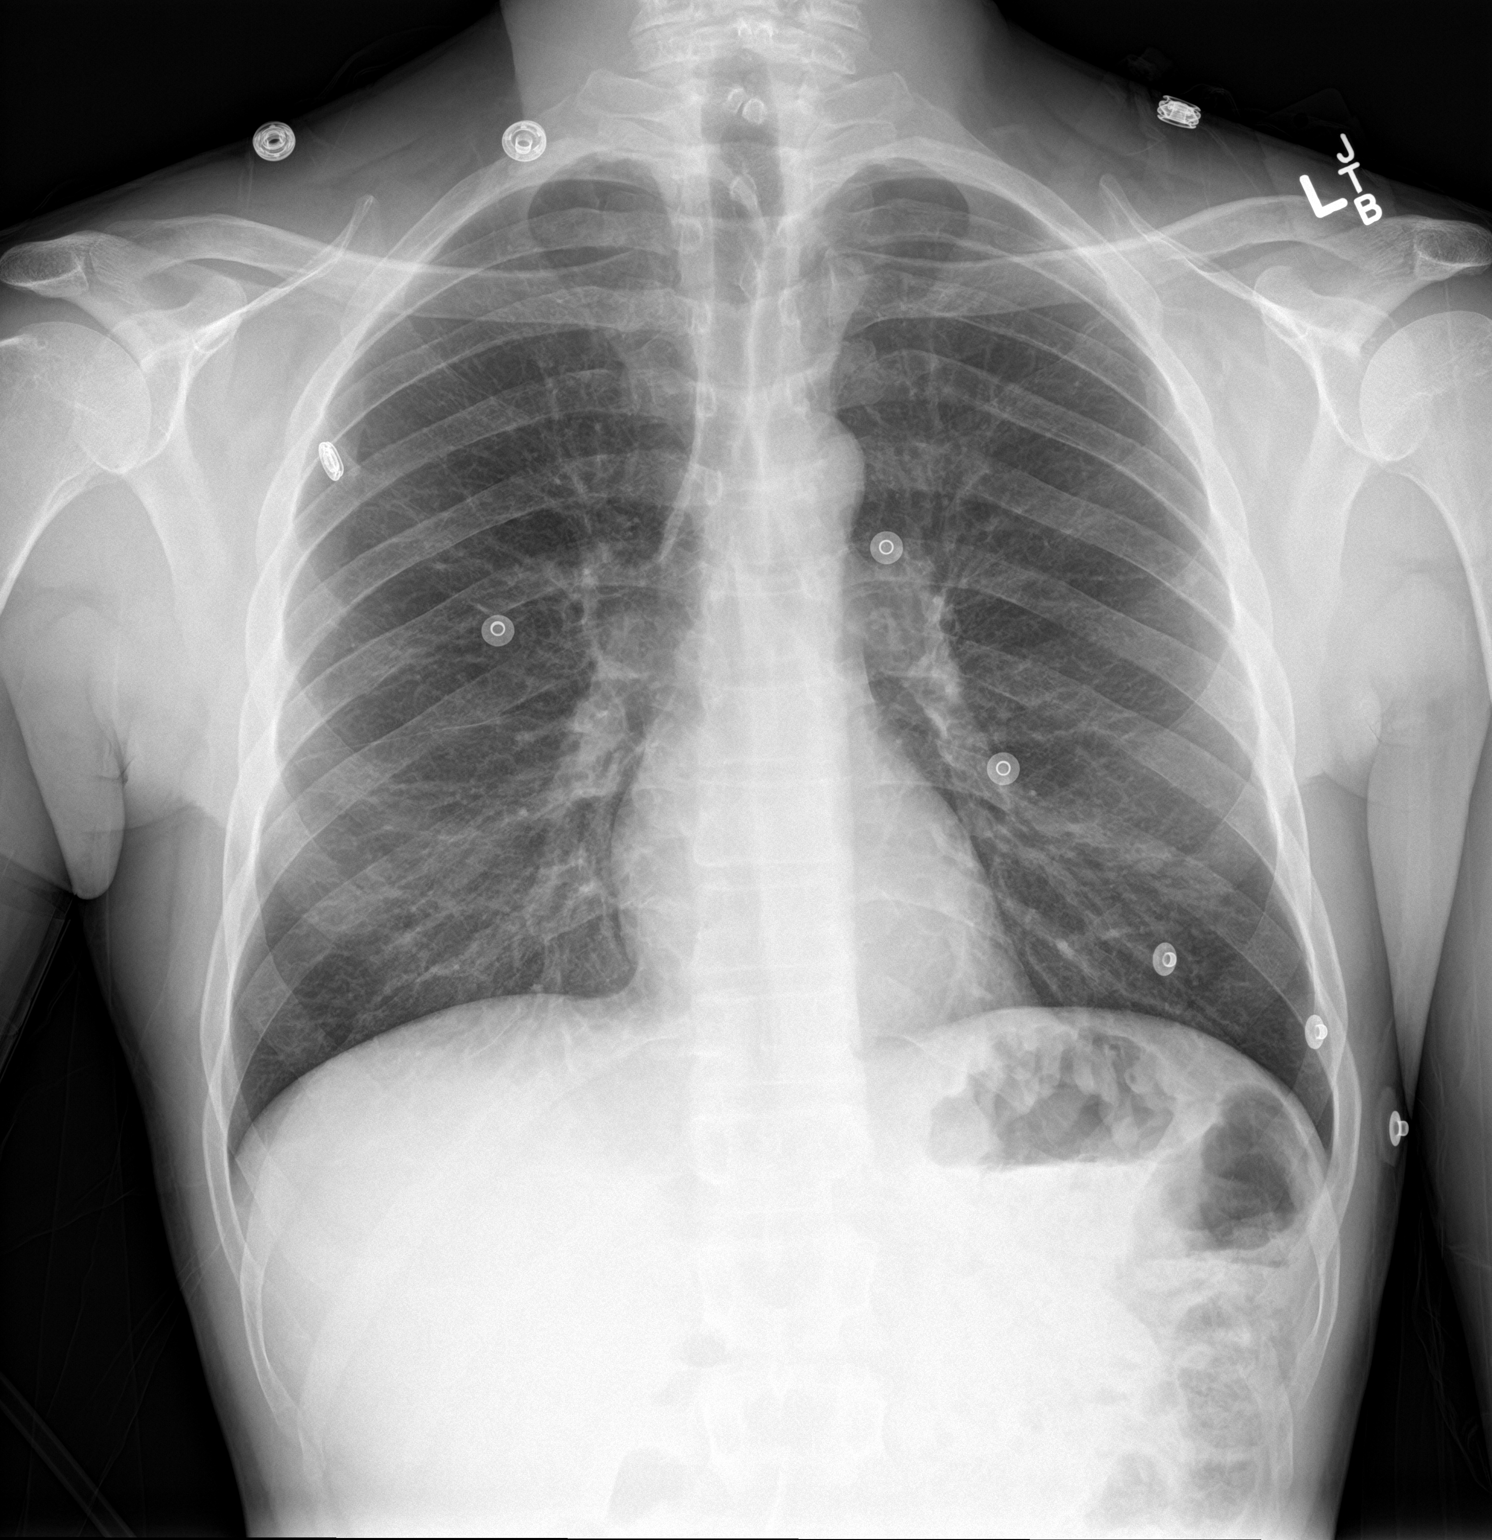

[rib pa obl]
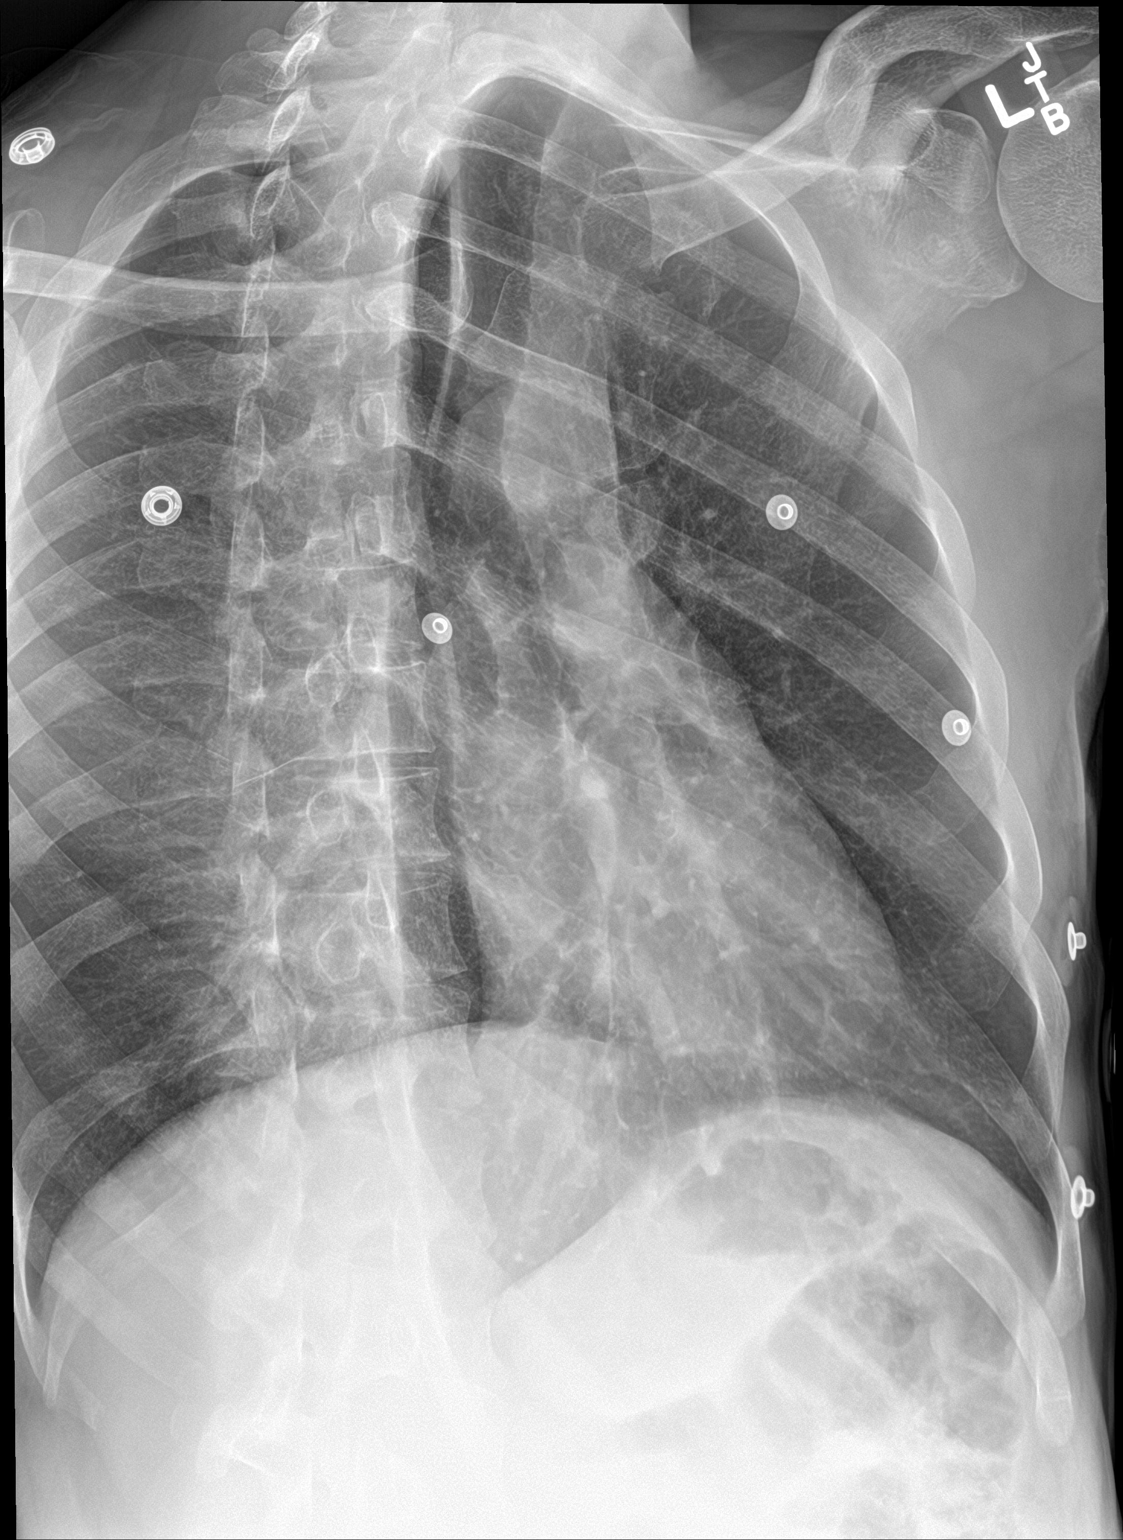

[rib pa]
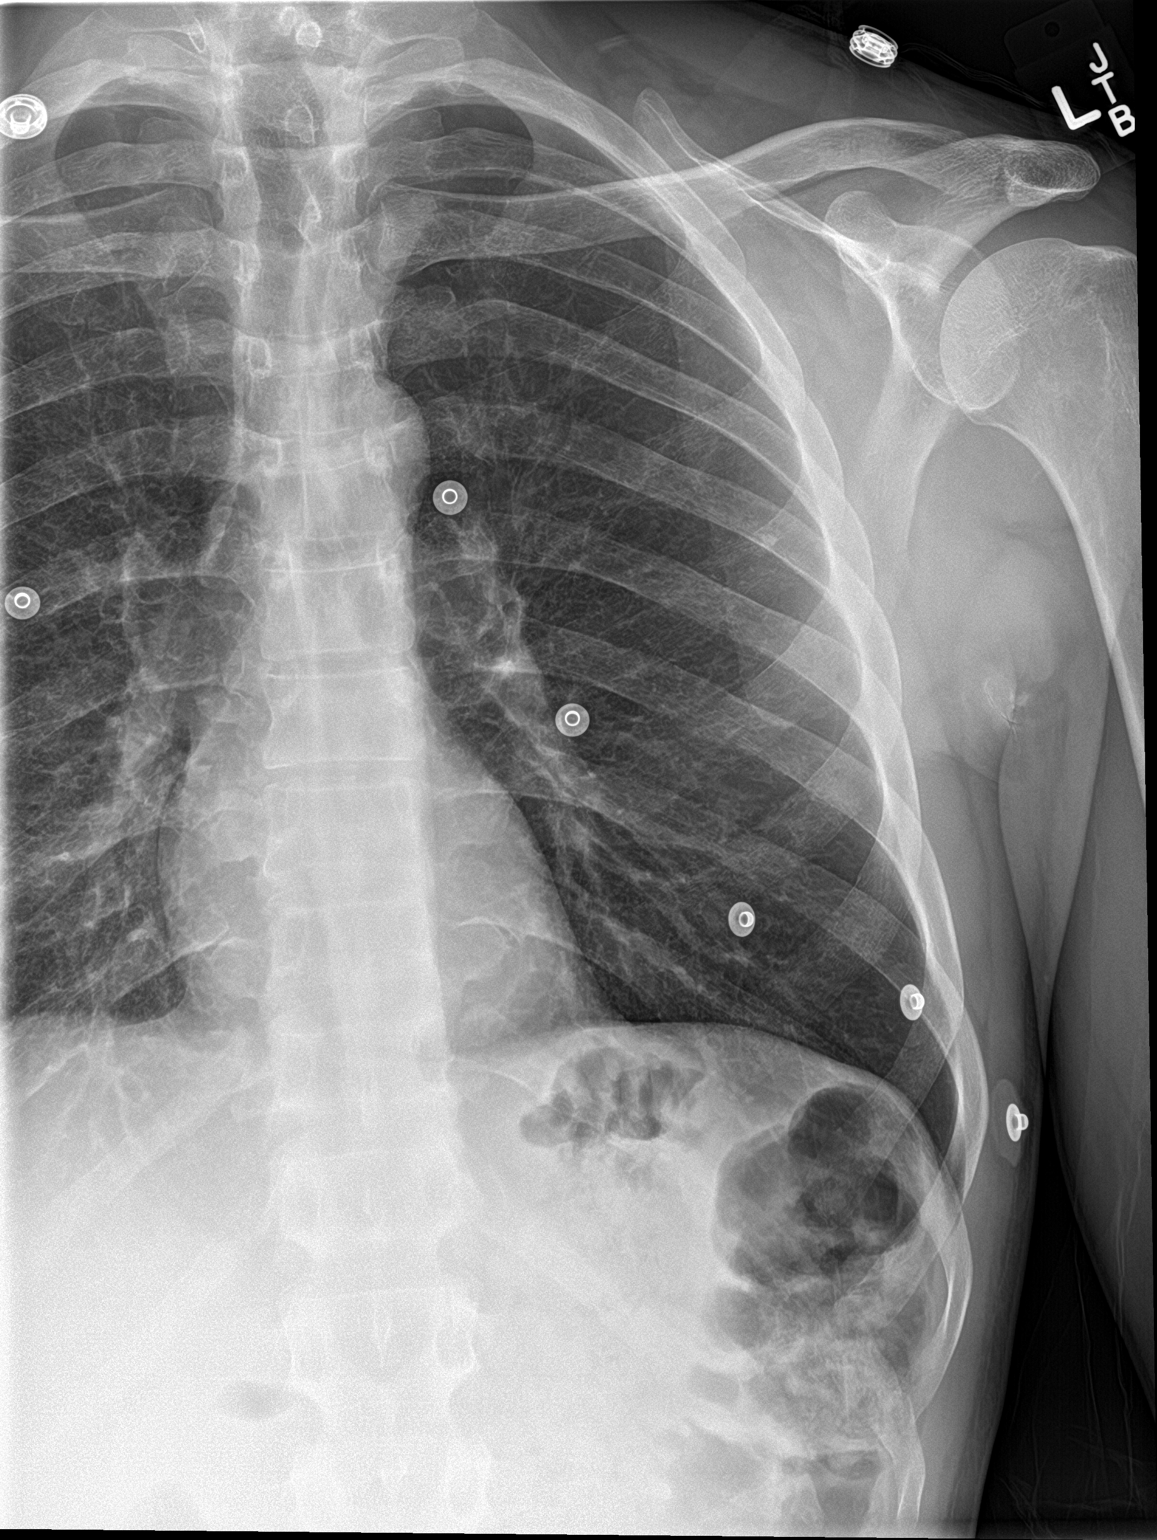

[3 of 3 positions shown; findings below may reference images not displayed]

FINDINGS: There is a fracture through the lateral left eleventh rib. No
additional rib fracture noted. Heart is normal size. Lungs are
clear. No effusions or pneumothorax.
IMPRESSION: Lateral left eleventh rib fracture. No acute cardiopulmonary
disease.

## 2018-06-15 IMAGING — CR DG CHEST 2V
2 series · 2 of 2 positions shown · non-contrast
Comparison: Prior radiograph from 12/09/2015.

CLINICAL DATA: Initial evaluation for acute chest pain with cough.

EXAM:
CHEST  2 VIEW

[chest pa]
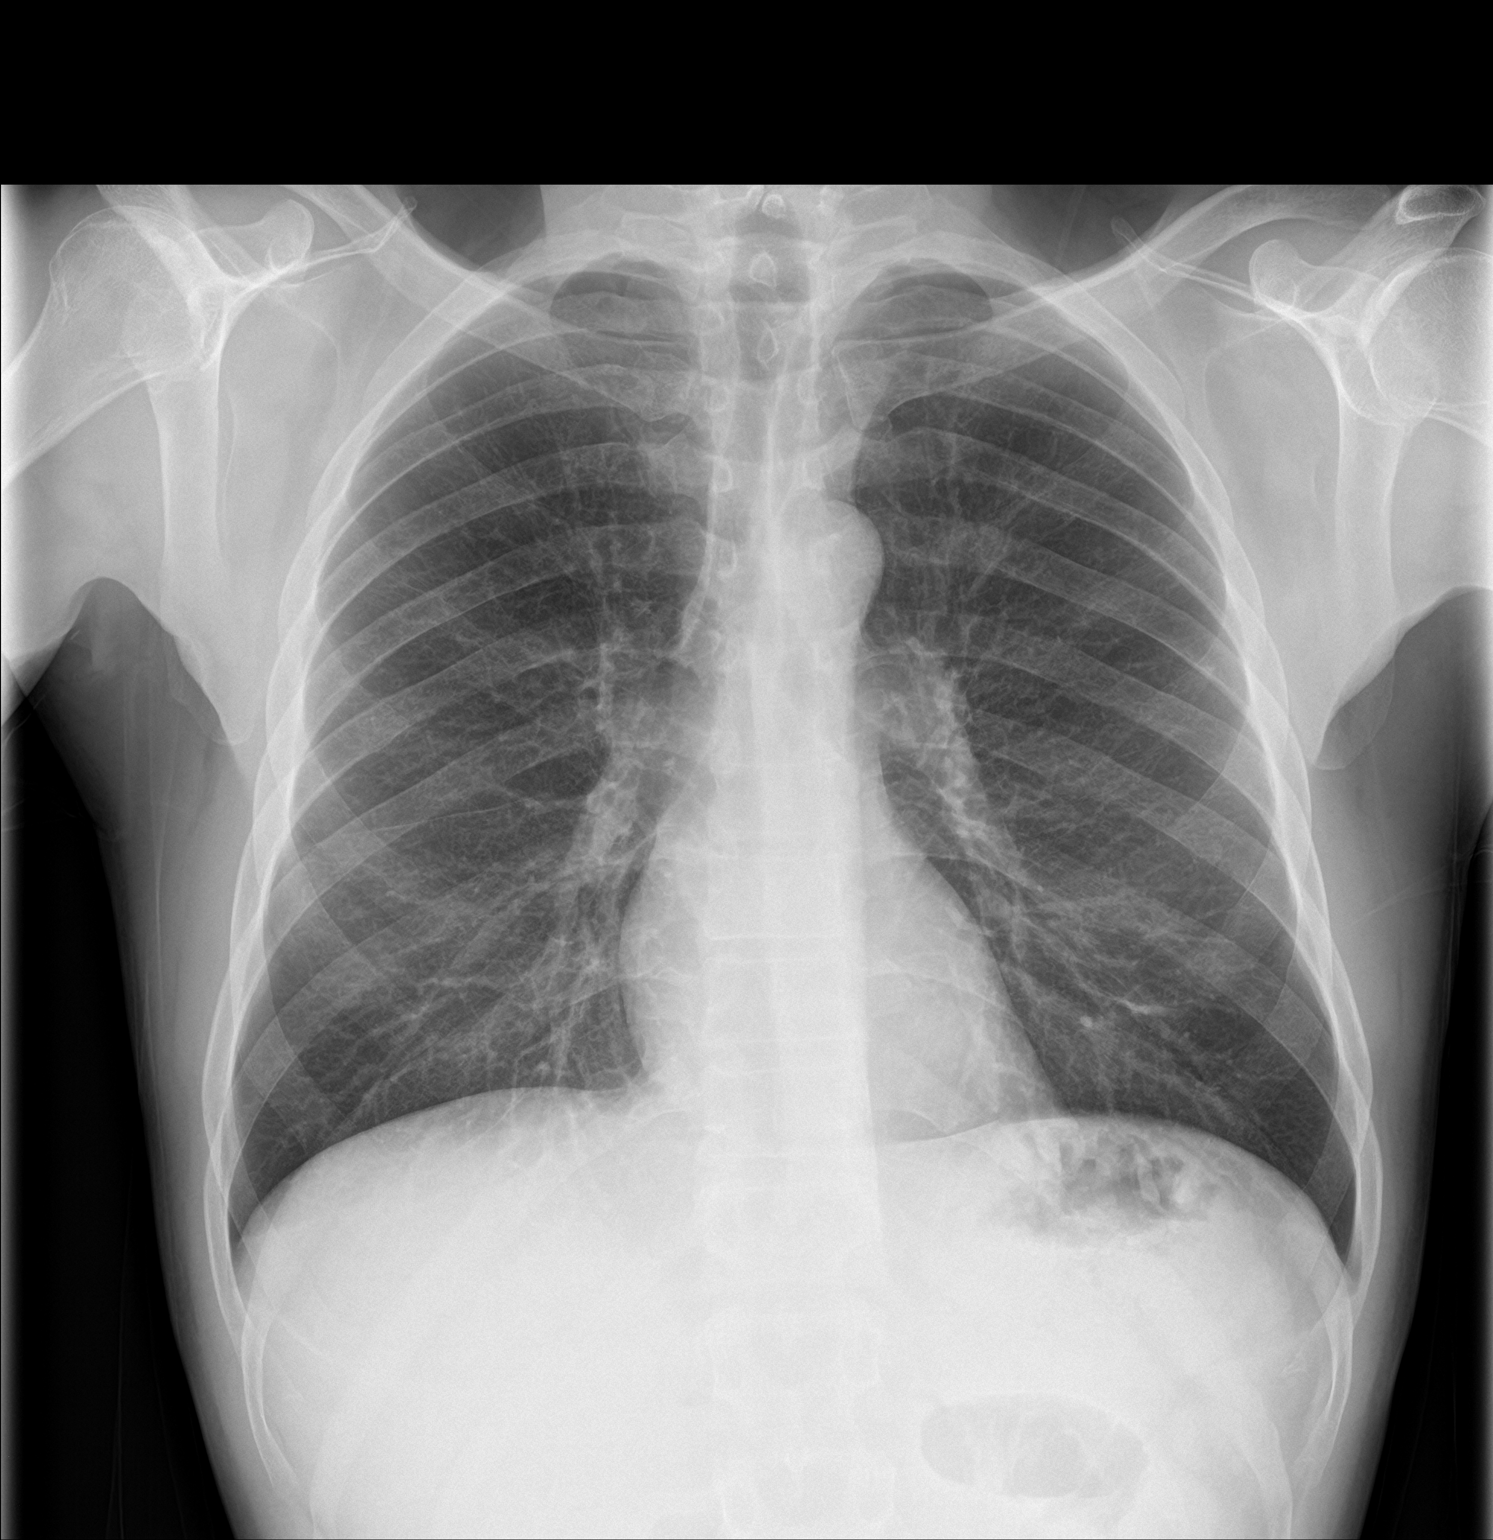

[chest lat]
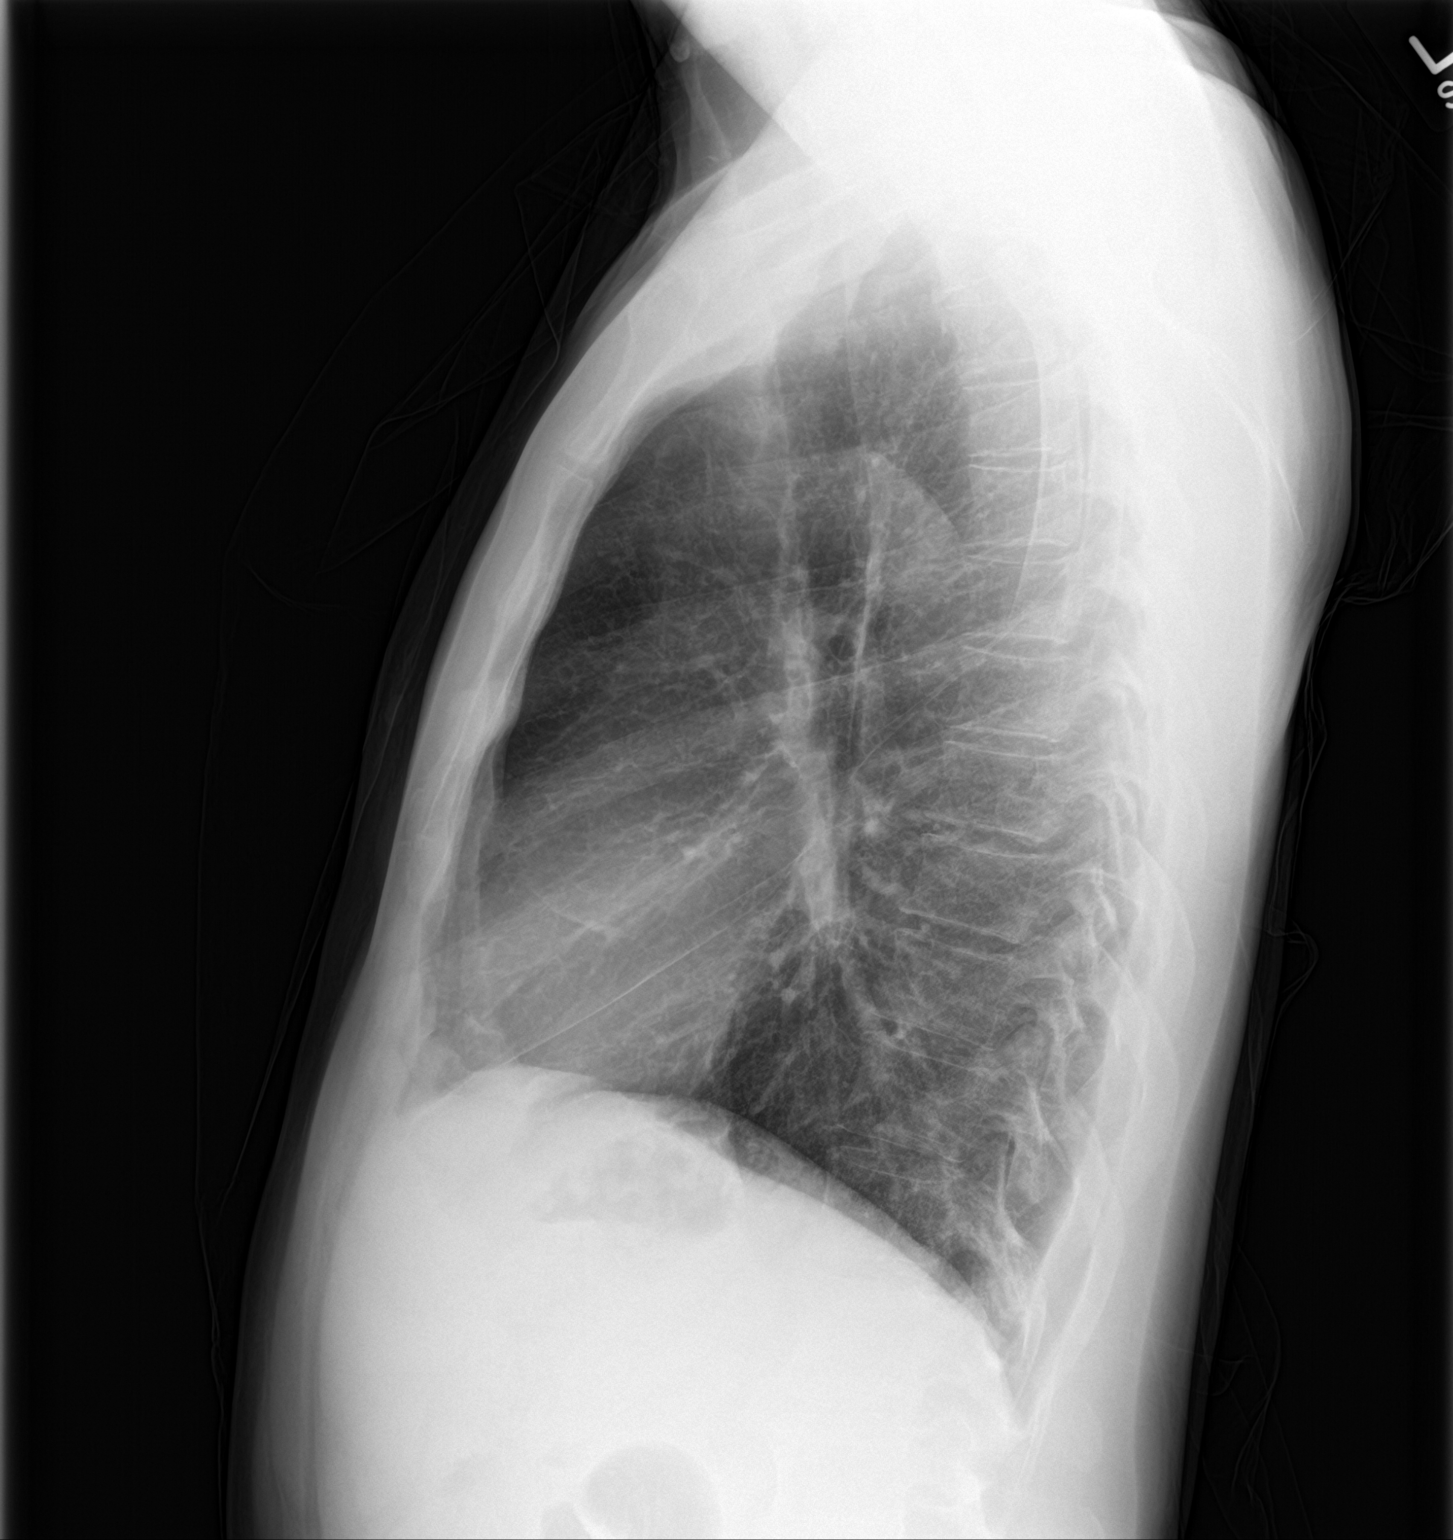

[2 of 2 positions shown; findings below may reference images not displayed]

FINDINGS: The cardiac and mediastinal silhouettes are stable in size and
contour, and remain within normal limits.

The lungs are normally inflated. No airspace consolidation, pleural
effusion, or pulmonary edema is identified. There is no
pneumothorax. Minimal atelectasis at the left lung base.

No acute osseous abnormality identified.
IMPRESSION: Mild left basilar atelectasis. No other active cardiopulmonary
disease.

## 2018-06-19 ENCOUNTER — Other Ambulatory Visit: Payer: Self-pay

## 2018-06-19 ENCOUNTER — Encounter (HOSPITAL_COMMUNITY): Payer: Self-pay | Admitting: Emergency Medicine

## 2018-06-19 ENCOUNTER — Emergency Department (HOSPITAL_COMMUNITY)
Admission: EM | Admit: 2018-06-19 | Discharge: 2018-06-19 | Disposition: A | Discharge status: Home / Self Care | Payer: Self-pay | Attending: Emergency Medicine | Admitting: Emergency Medicine

## 2018-06-19 DIAGNOSIS — F17290 Nicotine dependence, other tobacco product, uncomplicated: Secondary | ICD-10-CM | POA: Insufficient documentation

## 2018-06-19 DIAGNOSIS — K219 Gastro-esophageal reflux disease without esophagitis: Secondary | ICD-10-CM

## 2018-06-19 DIAGNOSIS — Z79899 Other long term (current) drug therapy: Secondary | ICD-10-CM | POA: Insufficient documentation

## 2018-06-19 DIAGNOSIS — F17210 Nicotine dependence, cigarettes, uncomplicated: Secondary | ICD-10-CM | POA: Insufficient documentation

## 2018-06-19 DIAGNOSIS — I10 Essential (primary) hypertension: Secondary | ICD-10-CM | POA: Insufficient documentation

## 2018-06-19 DIAGNOSIS — K860 Alcohol-induced chronic pancreatitis: Secondary | ICD-10-CM | POA: Insufficient documentation

## 2018-06-19 LAB — URINALYSIS, ROUTINE W REFLEX MICROSCOPIC
Bilirubin Urine: NEGATIVE
Glucose, UA: NEGATIVE mg/dL
Hgb urine dipstick: NEGATIVE
Ketones, ur: NEGATIVE mg/dL
Leukocytes, UA: NEGATIVE
NITRITE: NEGATIVE
PH: 5 (ref 5.0–8.0)
Protein, ur: NEGATIVE mg/dL
Specific Gravity, Urine: 1.02 (ref 1.005–1.030)

## 2018-06-19 LAB — CBC
HCT: 34.8 % — ABNORMAL LOW (ref 39.0–52.0)
Hemoglobin: 11.5 g/dL — ABNORMAL LOW (ref 13.0–17.0)
MCH: 32.1 pg (ref 26.0–34.0)
MCHC: 33 g/dL (ref 30.0–36.0)
MCV: 97.2 fL (ref 80.0–100.0)
Platelets: 203 10*3/uL (ref 150–400)
RBC: 3.58 MIL/uL — AB (ref 4.22–5.81)
RDW: 13.6 % (ref 11.5–15.5)
WBC: 7.6 10*3/uL (ref 4.0–10.5)
nRBC: 0.3 % — ABNORMAL HIGH (ref 0.0–0.2)

## 2018-06-19 LAB — COMPREHENSIVE METABOLIC PANEL
ALT: 61 U/L — ABNORMAL HIGH (ref 0–44)
AST: 87 U/L — ABNORMAL HIGH (ref 15–41)
Albumin: 3.6 g/dL (ref 3.5–5.0)
Alkaline Phosphatase: 77 U/L (ref 38–126)
Anion gap: 16 — ABNORMAL HIGH (ref 5–15)
BUN: 8 mg/dL (ref 6–20)
CO2: 22 mmol/L (ref 22–32)
Calcium: 8.3 mg/dL — ABNORMAL LOW (ref 8.9–10.3)
Chloride: 102 mmol/L (ref 98–111)
Creatinine, Ser: 0.81 mg/dL (ref 0.61–1.24)
GFR calc Af Amer: >60 mL/min (ref >60)
GFR calc non Af Amer: >60 mL/min (ref >60)
Glucose, Bld: 104 mg/dL — ABNORMAL HIGH (ref 70–99)
Potassium: 4.1 mmol/L (ref 3.5–5.1)
Sodium: 140 mmol/L (ref 135–145)
Total Bilirubin: 0.8 mg/dL (ref 0.3–1.2)
Total Protein: 6.5 g/dL (ref 6.5–8.1)

## 2018-06-19 LAB — LIPASE, BLOOD: Lipase: 60 U/L — ABNORMAL HIGH (ref 11–51)

## 2018-06-19 LAB — I-STAT TROPONIN, ED: Troponin i, poc: 0 ng/mL (ref 0.00–0.08)

## 2018-06-19 MED ORDER — PANTOPRAZOLE SODIUM 40 MG IV SOLR
40.0000 mg | Freq: Once | INTRAVENOUS | Status: AC
  Administered 2018-06-19: 40 mg via INTRAVENOUS
  Filled 2018-06-19: qty 40

## 2018-06-19 MED ORDER — CAPSAICIN 0.025 % EX CREA
TOPICAL_CREAM | Freq: Two times a day (BID) | CUTANEOUS | Status: DC
  Administered 2018-06-19: 09:00:00 via TOPICAL
  Filled 2018-06-19: qty 60

## 2018-06-19 MED ORDER — SODIUM CHLORIDE 0.9 % IV BOLUS
1000.0000 mL | Freq: Once | INTRAVENOUS | Status: AC
  Administered 2018-06-19: 1000 mL via INTRAVENOUS

## 2018-06-19 MED ORDER — ALUM & MAG HYDROXIDE-SIMETH 200-200-20 MG/5ML PO SUSP
30.0000 mL | Freq: Once | ORAL | Status: AC
  Administered 2018-06-19: 30 mL via ORAL
  Filled 2018-06-19: qty 30

## 2018-06-19 MED ORDER — PANTOPRAZOLE SODIUM 40 MG PO TBEC: 40.0000 mg | DELAYED_RELEASE_TABLET | Freq: Two times a day (BID) | ORAL | 2 refills | Status: DC

## 2018-06-19 MED ORDER — SUCRALFATE 1 G PO TABS: 1.0000 g | ORAL_TABLET | Freq: Four times a day (QID) | ORAL | 0 refills | Status: DC

## 2018-06-19 MED ORDER — LIDOCAINE VISCOUS HCL 2 % MT SOLN
15.0000 mL | Freq: Once | OROMUCOSAL | Status: AC
  Administered 2018-06-19: 15 mL via ORAL
  Filled 2018-06-19: qty 15

## 2018-06-19 MED ORDER — SODIUM CHLORIDE 0.9% FLUSH
3.0000 mL | Freq: Once | INTRAVENOUS | Status: AC
  Administered 2018-06-19: 3 mL via INTRAVENOUS

## 2018-06-19 MED ORDER — MORPHINE SULFATE (PF) 4 MG/ML IV SOLN
4.0000 mg | Freq: Once | INTRAVENOUS | Status: AC
  Administered 2018-06-19: 4 mg via INTRAVENOUS
  Filled 2018-06-19: qty 1

## 2018-06-19 NOTE — ED Provider Notes (Signed)
[facility] EMERGENCY DEPARTMENT Provider Note   CSN: [id number] Arrival date & time: 06/19/18  0208     History   Chief Complaint Chief Complaint  Patient presents with  . Chest Pain  . Pancreatitis    HPI [name] is a 48 y.o. male.  Patient is a 48 year old male with a history of chronic pancreatitis related to alcohol use who presents with epigastric and chest pain.  He states he was doing well for the last month but he did drink some alcohol yesterday and also ate some chocolate which he thinks flared up his pancreatitis.  He has pain in his epigastric area which radiates up into his chest.  He has no shortness of breath.  He has had some associated nausea and vomiting.  No fevers.  No change in bowel habits.  He is used his Carafate and Tylenol without improvement in symptoms.     Past Medical History:  Diagnosis Date  . Alcoholism /alcohol abuse ([place])   . Anemia   . Anxiety   . Arthritis    "knees; arms; elbows" (03/26/2015)  . Asthma   . Bipolar disorder ([place])   . Chronic bronchitis ([place])   . Chronic lower back pain   . Chronic pancreatitis ([place])   . Cocaine abuse ([place])   . Depression   . Family history of adverse reaction to anesthesia    "grandmother gets confused"  . Femoral condyle fracture ([place]) 03/08/2014   left medial/notes 03/09/2014  . GERD (gastroesophageal reflux disease)   . H/O hiatal hernia   . H/O suicide attempt 10/2012  . Heart murmur    "when he was little" (03/06/2013)  . High cholesterol   . History of blood transfusion 10/2012   "when I tried to commit suicide"  . History of stomach ulcers   . Hypertension   . Marijuana abuse, continuous   . Migraine    "a few times/year" (03/26/2015)  . Pneumonia 1990's X 3  . PTSD (post-traumatic stress disorder)   . Shortness of breath    "can happen at anytime" (03/06/2013)  . Sickle cell trait ([place])   . WPW (Wolff-Parkinson-White syndrome)    [name] 03/06/2013     Patient Active Problem List   Diagnosis Date Noted  . Gastritis and gastroduodenitis   . Chest pain 01/08/2018  . GI bleed 11/24/2017  . Acute blood loss anemia 11/24/2017  . Atypical chest pain 11/24/2017  . Acute pancreatitis 09/28/2017  . Abdominal pain 05/27/2017  . Hematemesis 05/27/2017  . Tachycardia 03/18/2017  . Diarrhea 03/18/2017  . Acute on chronic pancreatitis ([place]) 12/17/2016  . Intractable nausea and vomiting 12/05/2016  . Verbally abusive behavior 12/05/2016  . Normocytic anemia 12/05/2016  . Alcohol use disorder, severe, dependence ([place]) 07/25/2016  . Cocaine use disorder, severe, dependence ([place]) 07/25/2016  . 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 Leukocytosis   . Hospital acquired PNA 05/20/2015  . Chronic pancreatitis ([place]) 05/18/2015  . Pseudocyst of pancreas 05/18/2015  . Polysubstance abuse (tobacco, cocaine, THC, and ETOH) 03/26/2015  . Alcohol-induced chronic pancreatitis ([place])   . Benign essential HTN 02/06/2014  . Alcohol-induced acute pancreatitis 11/28/2013  . Pancreatic pseudocyst/cyst 11/25/2013  . Severe protein-calorie malnutrition ([place]) 10/10/2013  . Suicide attempt ([place]) 10/08/2013  . 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 TOBACCO ABUSE 03/23/2007    Past Surgical History:  Procedure Laterality Date  . BIOPSY  11/25/2017  Procedure: BIOPSY;  Surgeon: Arta Silence, MD;  Location: [place];  Service: Endoscopy;;  . CARDIAC CATHETERIZATION    . ESOPHAGOGASTRODUODENOSCOPY (EGD) WITH PROPOFOL N/A 11/25/2017   Procedure: ESOPHAGOGASTRODUODENOSCOPY (EGD) WITH PROPOFOL;  Surgeon: Arta Silence, MD;  Location: [place];  Service: Endoscopy;  Laterality: N/A;  . EYE SURGERY Left 1990's   "result of trauma"   . FACIAL FRACTURE SURGERY Left 1990's   "result of trauma"   . FRACTURE SURGERY    . HERNIA REPAIR    . LEFT HEART CATHETERIZATION WITH  CORONARY ANGIOGRAM Right 03/07/2013   Procedure: LEFT HEART CATHETERIZATION WITH CORONARY ANGIOGRAM;  Surgeon: [name], MD;  Location: [facility] CATH LAB;  Service: Cardiovascular;  Laterality: Right;  . UMBILICAL HERNIA REPAIR          Home Medications    Prior to Admission medications   Medication Sig Start Date End Date Taking? Authorizing Provider  acetaminophen (TYLENOL) 500 MG tablet Take 1,000 mg by mouth every 6 (six) hours as needed for mild pain.   Yes [provider]  Albuterol Sulfate (PROAIR HFA IN) Inhale 2 puffs into the lungs every 6 (six) hours as needed (for shortness of breath).   Yes [provider]  Cyanocobalamin (VITAMIN B-12 PO) Take 1 tablet by mouth daily.   Yes [provider]  famotidine (PEPCID) 20 MG tablet Take 1 tablet (20 mg total) by mouth 2 (two) times daily. 04/06/18  Yes Orlie Dakin, MD  folic acid (FOLVITE) 1 MG tablet Take 1 tablet (1 mg total) by mouth daily. 11/26/17  Yes [name], MD  HYDRALAZINE HCL PO Take 1 tablet by mouth every 8 (eight) hours.   Yes [provider]  lipase/protease/amylase (CREON) 12000 units CPEP capsule Take 1 capsule (12,000 Units total) by mouth 3 (three) times daily with meals. For pancreatitis 10/01/17  Yes Aline August, MD  loratadine (CLARITIN) 10 MG tablet Take 1 tablet (10 mg total) by mouth daily as needed for allergies or rhinitis. (May purchase from over the counter): For allergies 10/01/17  Yes Aline August, MD  metoprolol tartrate (LOPRESSOR) 25 MG tablet Take 1 tablet (25 mg total) by mouth 2 (two) times daily. For high blood pressure 08/29/17  Yes Clent Demark, PA-C  Multiple Vitamin (MULTIVITAMIN WITH MINERALS) TABS tablet Take 1 tablet by mouth daily. 09/06/17  Yes Bonnell Public, MD  omeprazole (PRILOSEC) 20 MG capsule Take 1 capsule (20 mg total) by mouth daily. 03/26/18  Yes Noemi Chapel, MD  promethazine (PHENERGAN) 25 MG tablet Take 25 mg by mouth  daily as needed for nausea or vomiting.   Yes [provider]  QUEtiapine (SEROQUEL) 100 MG tablet Take 100 mg by mouth at bedtime.   Yes [provider]  sertraline (ZOLOFT) 100 MG tablet Take 1 tablet (100 mg total) by mouth daily. Patient taking differently: Take 150 mg by mouth daily.  08/29/17  Yes Clent Demark, PA-C  ondansetron (ZOFRAN) 4 MG tablet Take 1 tablet (4 mg total) by mouth every 8 (eight) hours as needed for nausea or vomiting. Patient not taking: Reported on 4/43/1540 0/86/76   Delora Fuel, MD  pantoprazole (PROTONIX) 40 MG tablet Take 1 tablet (40 mg total) by mouth 2 (two) times daily. 06/19/18   Malvin Johns, MD  sucralfate (CARAFATE) 1 g tablet Take 1 tablet (1 g total) by mouth 4 (four) times daily. 06/19/18   Malvin Johns, MD    Family History Family History  Problem  Relation Age of Onset  . Hypertension Mother   . Cirrhosis Mother   . Alcoholism Mother   . Hypertension Father   . Melanoma Father   . Hypertension Other   . Coronary artery disease Other     Social History Social History   Tobacco Use  . Smoking status: Current Every Day Smoker    Packs/day: 1.00    Years: 33.00    Pack years: 33.00    Types: Cigarettes, E-cigarettes  . Smokeless tobacco: Never Used  Substance Use Topics  . Alcohol use: Yes  . Drug use: Yes    Types: Marijuana, Cocaine    Comment: daily marijuana use; last cocaine use about 3 months ago     Allergies   Robaxin [methocarbamol]; Shellfish-derived products; Trazodone; Trazodone and nefazodone; Adhesive [tape]; Latex; Toradol [ketorolac tromethamine]; Contrast media [iodinated diagnostic agents]; and Reglan [metoclopramide]   Review of Systems Review of Systems  Constitutional: Negative for chills, diaphoresis, fatigue and fever.  HENT: Negative for congestion, rhinorrhea and sneezing.   Eyes: Negative.   Respiratory: Negative for cough, chest tightness and shortness of breath.     Cardiovascular: Positive for chest pain. Negative for leg swelling.  Gastrointestinal: Positive for abdominal pain, nausea and vomiting. Negative for blood in stool and diarrhea.  Genitourinary: Negative for difficulty urinating, flank pain, frequency and hematuria.  Musculoskeletal: Negative for arthralgias and back pain.  Skin: Negative for rash.  Neurological: Negative for dizziness, speech difficulty, weakness, numbness and headaches.     Physical Exam Updated Vital Signs BP (!) 135/97   Pulse 90   Temp 98.3 F (36.8 C) (Oral)   Resp 12   SpO2 100%   Physical Exam Constitutional:      Appearance: He is well-developed.  HENT:     Head: Normocephalic and atraumatic.  Eyes:     Pupils: Pupils are equal, round, and reactive to light.  Neck:     Musculoskeletal: Normal range of motion and neck supple.  Cardiovascular:     Rate and Rhythm: Normal rate and regular rhythm.     Heart sounds: Normal heart sounds.  Pulmonary:     Effort: Pulmonary effort is normal. No respiratory distress.     Breath sounds: Normal breath sounds. No wheezing or rales.  Chest:     Chest wall: No tenderness.  Abdominal:     General: Bowel sounds are normal.     Palpations: Abdomen is soft.     Tenderness: There is abdominal tenderness (Mild tenderness to the epigastrium). There is no guarding or rebound.  Musculoskeletal: Normal range of motion.  Lymphadenopathy:     Cervical: No cervical adenopathy.  Skin:    General: Skin is warm and dry.     Findings: No rash.  Neurological:     Mental Status: He is alert and oriented to person, place, and time.      ED Treatments / Results  Labs (all labs ordered are listed, but only abnormal results are displayed) Labs Reviewed  LIPASE, BLOOD - Abnormal; Notable for the following components:      Result Value   Lipase 60 (*)    All other components within normal limits  COMPREHENSIVE METABOLIC PANEL - Abnormal; Notable for the following  components:   Glucose, Bld 104 (*)    Calcium 8.3 (*)    AST 87 (*)    ALT 61 (*)    Anion gap 16 (*)    All other components within normal limits  CBC - Abnormal; Notable for the following components:   RBC 3.58 (*)    Hemoglobin 11.5 (*)    HCT 34.8 (*)    nRBC 0.3 (*)    All other components within normal limits  URINALYSIS, ROUTINE W REFLEX MICROSCOPIC  I-STAT TROPONIN, ED    EKG EKG Interpretation  Date/Time:  Tuesday June 19 2018 07:34:29 EST Ventricular Rate:  95 PR Interval:    QRS Duration: 81 QT Interval:  307 QTC Calculation: 386 R Axis:   80 Text Interpretation:  Sinus rhythm Short PR interval Right atrial enlargement Borderline repolarization abnormality no change from EKG from 03/17/18 Confirmed by Malvin Johns [phone]) on 06/19/2018 7:40:30 AM   Radiology No results found.  Procedures Procedures (including critical care time)  Medications Ordered in ED Medications  capsaicin (ZOSTRIX) 0.025 % cream (has no administration in time range)  sodium chloride flush (NS) 0.9 % injection 3 mL (3 mLs Intravenous Given 06/19/18 0804)  alum & mag hydroxide-simeth (MAALOX/MYLANTA) 200-200-20 MG/5ML suspension 30 mL (30 mLs Oral Given 06/19/18 0756)    And  lidocaine (XYLOCAINE) 2 % viscous mouth solution 15 mL (15 mLs Oral Given 06/19/18 0756)  pantoprazole (PROTONIX) injection 40 mg (40 mg Intravenous Given 06/19/18 0757)  sodium chloride 0.9 % bolus 1,000 mL (1,000 mLs Intravenous New Bag/Given 06/19/18 0803)  morphine 4 MG/ML injection 4 mg (4 mg Intravenous Given 06/19/18 0859)     Initial Impression / Assessment and Plan / ED Course  I have reviewed the triage vital signs and the nursing notes.  Pertinent labs & imaging results that were available during my care of the patient were reviewed by me and considered in my medical decision making (see chart for details).     Patient is a 48 year old male who presents with a flareup of his chronic pancreatitis.   His lipase is mildly elevated.  His liver enzymes are elevated but similar to prior values.  His pain is controlled in the ED after medications.  He was discharged with refills for his Protonix and Carafate.  He will follow-up with his PCP.  He has been seen by [facility] gastroenterology but says he cannot go there until he pays the money that he owes.  Return precautions were given.  Final Clinical Impressions(s) / ED Diagnoses   Final diagnoses:  Alcohol-induced chronic pancreatitis [facility])    ED Discharge Orders         Ordered    pantoprazole (PROTONIX) 40 MG tablet  2 times daily     06/19/18 0916    sucralfate (CARAFATE) 1 g tablet  4 times daily     06/19/18 0916           Malvin Johns, MD 06/19/18 [phone]

## 2018-06-19 NOTE — ED Triage Notes (Signed)
Pt c/o abdominal pain that radiates to his chest and back. 1 episode of vomiting, also c/o dizziness.

## 2018-07-03 ENCOUNTER — Emergency Department (HOSPITAL_COMMUNITY)
Admission: EM | Admit: 2018-07-03 | Discharge: 2018-07-03 | Disposition: A | Discharge status: Home / Self Care | Payer: Self-pay | Attending: Emergency Medicine | Admitting: Emergency Medicine

## 2018-07-03 ENCOUNTER — Emergency Department (HOSPITAL_COMMUNITY): Payer: Self-pay

## 2018-07-03 DIAGNOSIS — Z765 Malingerer [conscious simulation]: Secondary | ICD-10-CM

## 2018-07-03 DIAGNOSIS — Z79899 Other long term (current) drug therapy: Secondary | ICD-10-CM | POA: Insufficient documentation

## 2018-07-03 DIAGNOSIS — R52 Pain, unspecified: Secondary | ICD-10-CM

## 2018-07-03 DIAGNOSIS — G8929 Other chronic pain: Secondary | ICD-10-CM

## 2018-07-03 DIAGNOSIS — R109 Unspecified abdominal pain: Secondary | ICD-10-CM | POA: Insufficient documentation

## 2018-07-03 DIAGNOSIS — F17210 Nicotine dependence, cigarettes, uncomplicated: Secondary | ICD-10-CM | POA: Insufficient documentation

## 2018-07-03 DIAGNOSIS — I10 Essential (primary) hypertension: Secondary | ICD-10-CM | POA: Insufficient documentation

## 2018-07-03 DIAGNOSIS — R0781 Pleurodynia: Secondary | ICD-10-CM | POA: Insufficient documentation

## 2018-07-03 LAB — I-STAT TROPONIN, ED: Troponin i, poc: 0 ng/mL (ref 0.00–0.08)

## 2018-07-03 MED ORDER — SODIUM CHLORIDE 0.9% FLUSH: 3.0000 mL | Freq: Once | INTRAVENOUS | Status: DC

## 2018-07-03 NOTE — ED Notes (Signed)
Declined W/C at D/C and was escorted to lobby by RN. 

## 2018-07-03 NOTE — ED Provider Notes (Signed)
[facility] EMERGENCY DEPARTMENT Provider Note   CSN: [id number] Arrival date & time: 07/03/18  0915   History   Chief Complaint Chief Complaint  Patient presents with  . Chest Pain    HPI [name] is a 48 y.o. male.    HPI   48 year old male presents today with chronic abdominal pain.  Patient notes he had a syncopal episode earlier in the day with his associated pain.  She notes pain to the right lateral ribs.  He denies any neck pain.  Is requesting narcotics for his rib pain.     Past Medical History:  Diagnosis Date  . Alcoholism /alcohol abuse ([place])   . Anemia   . Anxiety   . Arthritis    "knees; arms; elbows" (03/26/2015)  . Asthma   . Bipolar disorder ([place])   . Chronic bronchitis ([place])   . Chronic lower back pain   . Chronic pancreatitis ([place])   . Cocaine abuse ([place])   . Depression   . Family history of adverse reaction to anesthesia    "grandmother gets confused"  . Femoral condyle fracture ([place]) 03/08/2014   left medial/notes 03/09/2014  . GERD (gastroesophageal reflux disease)   . H/O hiatal hernia   . H/O suicide attempt 10/2012  . Heart murmur    "when he was little" (03/06/2013)  . High cholesterol   . History of blood transfusion 10/2012   "when I tried to commit suicide"  . History of stomach ulcers   . Hypertension   . Marijuana abuse, continuous   . Migraine    "a few times/year" (03/26/2015)  . Pneumonia 1990's X 3  . PTSD (post-traumatic stress disorder)   . Shortness of breath    "can happen at anytime" (03/06/2013)  . Sickle cell trait ([place])   . WPW (Wolff-Parkinson-White syndrome)    [name] 03/06/2013    Patient Active Problem List   Diagnosis Date Noted  . Gastritis and gastroduodenitis   . Chest pain 01/08/2018  . GI bleed 11/24/2017  . Acute blood loss anemia 11/24/2017  . Atypical chest pain 11/24/2017  . Acute pancreatitis 09/28/2017  . Abdominal pain 05/27/2017  . Hematemesis 05/27/2017   . Tachycardia 03/18/2017  . Diarrhea 03/18/2017  . Acute on chronic pancreatitis ([place]) 12/17/2016  . Intractable nausea and vomiting 12/05/2016  . Verbally abusive behavior 12/05/2016  . Normocytic anemia 12/05/2016  . Alcohol use disorder, severe, dependence ([place]) 07/25/2016  . Cocaine use disorder, severe, dependence ([place]) 07/25/2016  . Major depressive disorder, recurrent severe without psychotic features ([place]) 07/20/2016  . Leukocytosis   . Hospital acquired PNA 05/20/2015  . Chronic pancreatitis ([place]) 05/18/2015  . Pseudocyst of pancreas 05/18/2015  . Polysubstance abuse (tobacco, cocaine, THC, and ETOH) 03/26/2015  . Alcohol-induced chronic pancreatitis ([place])   . Benign essential HTN 02/06/2014  . Alcohol-induced acute pancreatitis 11/28/2013  . Pancreatic pseudocyst/cyst 11/25/2013  . Severe protein-calorie malnutrition ([place]) 10/10/2013  . Suicide attempt ([place]) 10/08/2013  . 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 TOBACCO ABUSE 03/23/2007    Past Surgical History:  Procedure Laterality Date  . BIOPSY  11/25/2017   Procedure: BIOPSY;  Surgeon: Arta Silence, MD;  Location: [facility];  Service: Endoscopy;;  . CARDIAC CATHETERIZATION    . ESOPHAGOGASTRODUODENOSCOPY (EGD) WITH PROPOFOL N/A 11/25/2017   Procedure: ESOPHAGOGASTRODUODENOSCOPY (EGD) WITH PROPOFOL;  Surgeon: Arta Silence, MD;  Location: [facility];  Service: Endoscopy;  Laterality: N/A;  . EYE SURGERY Left 1990's   "  result of trauma"   . FACIAL FRACTURE SURGERY Left 1990's   "result of trauma"   . FRACTURE SURGERY    . HERNIA REPAIR    . LEFT HEART CATHETERIZATION WITH CORONARY ANGIOGRAM Right 03/07/2013   Procedure: LEFT HEART CATHETERIZATION WITH CORONARY ANGIOGRAM;  Surgeon: [name], MD;  Location: [facility] CATH LAB;  Service: Cardiovascular;  Laterality: Right;  . UMBILICAL HERNIA REPAIR          Home Medications    Prior to Admission medications   Medication  Sig Start Date End Date Taking? Authorizing Provider  acetaminophen (TYLENOL) 500 MG tablet Take 1,000 mg by mouth every 6 (six) hours as needed for mild pain.    [provider]  Albuterol Sulfate (PROAIR HFA IN) Inhale 2 puffs into the lungs every 6 (six) hours as needed (for shortness of breath).    [provider]  Cyanocobalamin (VITAMIN B-12 PO) Take 1 tablet by mouth daily.    [provider]  famotidine (PEPCID) 20 MG tablet Take 1 tablet (20 mg total) by mouth 2 (two) times daily. 04/06/18   Orlie Dakin, MD  folic acid (FOLVITE) 1 MG tablet Take 1 tablet (1 mg total) by mouth daily. 11/26/17   [name], MD  HYDRALAZINE HCL PO Take 1 tablet by mouth every 8 (eight) hours.    [provider]  lipase/protease/amylase (CREON) 12000 units CPEP capsule Take 1 capsule (12,000 Units total) by mouth 3 (three) times daily with meals. For pancreatitis 10/01/17   Aline August, MD  loratadine (CLARITIN) 10 MG tablet Take 1 tablet (10 mg total) by mouth daily as needed for allergies or rhinitis. (May purchase from over the counter): For allergies 10/01/17   Aline August, MD  metoprolol tartrate (LOPRESSOR) 25 MG tablet Take 1 tablet (25 mg total) by mouth 2 (two) times daily. For high blood pressure 08/29/17   Clent Demark, PA-C  Multiple Vitamin (MULTIVITAMIN WITH MINERALS) TABS tablet Take 1 tablet by mouth daily. 09/06/17   Bonnell Public, MD  omeprazole (PRILOSEC) 20 MG capsule Take 1 capsule (20 mg total) by mouth daily. 03/26/18   Noemi Chapel, MD  ondansetron (ZOFRAN) 4 MG tablet Take 1 tablet (4 mg total) by mouth every 8 (eight) hours as needed for nausea or vomiting. Patient not taking: Reported on 7/34/1937 01/09/39   Delora Fuel, MD  pantoprazole (PROTONIX) 40 MG tablet Take 1 tablet (40 mg total) by mouth 2 (two) times daily. 06/19/18   Malvin Johns, MD  promethazine (PHENERGAN) 25 MG tablet Take 25 mg by mouth daily as needed for  nausea or vomiting.    [provider]  QUEtiapine (SEROQUEL) 100 MG tablet Take 100 mg by mouth at bedtime.    [provider]  sertraline (ZOLOFT) 100 MG tablet Take 1 tablet (100 mg total) by mouth daily. Patient taking differently: Take 150 mg by mouth daily.  08/29/17   Clent Demark, PA-C  sucralfate (CARAFATE) 1 g tablet Take 1 tablet (1 g total) by mouth 4 (four) times daily. 06/19/18   Malvin Johns, MD    Family History Family History  Problem Relation Age of Onset  . Hypertension Mother   . Cirrhosis Mother   . Alcoholism Mother   . Hypertension Father   . Melanoma Father   . Hypertension Other   . Coronary artery disease Other     Social History Social History   Tobacco Use  . Smoking status: Current Every  Day Smoker    Packs/day: 1.00    Years: 33.00    Pack years: 33.00    Types: Cigarettes, E-cigarettes  . Smokeless tobacco: Never Used  Substance Use Topics  . Alcohol use: Yes  . Drug use: Yes    Types: Marijuana, Cocaine    Comment: daily marijuana use; last cocaine use about 3 months ago     Allergies   Robaxin [methocarbamol]; Shellfish-derived products; Trazodone; Trazodone and nefazodone; Adhesive [tape]; Latex; Toradol [ketorolac tromethamine]; Contrast media [iodinated diagnostic agents]; and Reglan [metoclopramide]   Review of Systems Review of Systems  All other systems reviewed and are negative.    Physical Exam Updated Vital Signs BP (!) 136/93 (BP Location: Right Arm)   Pulse (!) 111   Resp 17   Ht 5\' 9"  (1.753 m)   Wt 57.2 kg   SpO2 100%   BMI 18.61 kg/m   Physical Exam Vitals signs and nursing note reviewed.  Constitutional:      Appearance: He is well-developed.  HENT:     Head: Normocephalic and atraumatic.  Eyes:     General: No scleral icterus.       Right eye: No discharge.        Left eye: No discharge.     Conjunctiva/sclera: Conjunctivae normal.     Pupils: Pupils are equal, round, and  reactive to light.  Neck:     Musculoskeletal: Normal range of motion.     Vascular: No JVD.     Trachea: No tracheal deviation.  Pulmonary:     Effort: Pulmonary effort is normal.     Breath sounds: No stridor.     Comments: Ribs atraumatic, tenderness palpation right lower lateral Abdominal:     Comments: Tenderness palpation of epigastric and left upper quadrant  Neurological:     Mental Status: He is alert and oriented to person, place, and time.     Coordination: Coordination normal.  Psychiatric:        Behavior: Behavior normal.        Thought Content: Thought content normal.        Judgment: Judgment normal.      ED Treatments / Results  Labs (all labs ordered are listed, but only abnormal results are displayed) Labs Reviewed  I-STAT TROPONIN, ED    EKG None  Radiology Dg Chest 2 View  Result Date: 07/03/2018 CLINICAL DATA:  Fall.  Injured right ribs. EXAM: CHEST - 2 VIEW COMPARISON:  05/30/2018. FINDINGS: Mediastinum hilar structures normal. Low lung volumes with mild bibasilar atelectasis. No pleural effusion or pneumothorax. No evidence of displaced rib fracture. IMPRESSION: Mild bibasilar subsegmental atelectasis. No evidence of displaced rib fracture or pneumothorax. Electronically Signed   By: Marcello Moores  Register   On: 07/03/2018 10:22   Dg Ribs Unilateral Right  Result Date: 07/03/2018 CLINICAL DATA:  Fall yesterday.  Anterior rib pain. EXAM: RIGHT RIBS - 2 VIEW COMPARISON:  Two-view chest x-ray of the same day. FINDINGS: Dedicated imaging of the right ribs demonstrates no acute or healing fracture. There is no pneumothorax. Mild atelectasis or scarring is present at the right base. IMPRESSION: No acute fracture or pneumothorax. Electronically Signed   By: San Morelle M.D.   On: 07/03/2018 10:22    Procedures Procedures (including critical care time)  Medications Ordered in ED Medications - No data to display   Initial Impression / Assessment and  Plan / ED Course  I have reviewed the triage vital signs and the nursing notes.  Pertinent labs & imaging results that were available during my care of the patient were reviewed by me and considered in my medical decision making (see chart for details).        48 year old male presents today with chronic abdominal pain.  His rib films show no acute findings.  His presentation is typical of previous.  Patient is drug-seeking here.  Discharged with outpatient follow-up.  Final Clinical Impressions(s) / ED Diagnoses   Final diagnoses:  Drug-seeking behavior  Rib pain  Chronic abdominal pain    ED Discharge Orders    None       Okey Regal, PA-C 07/03/18 1036    Malvin Johns, MD 07/03/18 1041

## 2018-07-03 NOTE — ED Triage Notes (Signed)
Per [facility]: Patient to ED initial c/o sharp CP onset 1 hour ago while walking from convenience store back to home. He states when he got home, he "blacked out for a minute" and was able to get himself up, change clothes, then called EMS. Endorses ETOH last night. A&O x 4, c-collar applied PTA. Patient c/o R rib pain - no deformity noted, resp e/u. EMS VS: HR 106 sinus tach, 136/90, 98% @L  O2 [place], CBG 109. 20g. PIV LFA.

## 2018-07-03 NOTE — ED Triage Notes (Signed)
Pt demonstrated aggressive behavior at DC. Pt entered the [facility]. Box  To confront PA about DC papers. Security called to escort Pt off Hospital property.

## 2018-07-03 NOTE — Discharge Instructions (Addendum)
Please read attached information. If you experience any new or worsening signs or symptoms please return to the emergency room for evaluation. Please follow-up with your primary care provider or specialist as discussed.  °

## 2018-07-09 ENCOUNTER — Encounter (HOSPITAL_COMMUNITY): Payer: Self-pay

## 2018-07-09 ENCOUNTER — Emergency Department (HOSPITAL_COMMUNITY)
Admission: EM | Admit: 2018-07-09 | Discharge: 2018-07-09 | Disposition: A | Discharge status: Home / Self Care | Payer: Self-pay | Attending: Emergency Medicine | Admitting: Emergency Medicine

## 2018-07-09 ENCOUNTER — Emergency Department (HOSPITAL_COMMUNITY): Payer: Self-pay

## 2018-07-09 ENCOUNTER — Other Ambulatory Visit: Payer: Self-pay

## 2018-07-09 DIAGNOSIS — D573 Sickle-cell trait: Secondary | ICD-10-CM | POA: Insufficient documentation

## 2018-07-09 DIAGNOSIS — F17210 Nicotine dependence, cigarettes, uncomplicated: Secondary | ICD-10-CM | POA: Insufficient documentation

## 2018-07-09 DIAGNOSIS — Z79899 Other long term (current) drug therapy: Secondary | ICD-10-CM | POA: Insufficient documentation

## 2018-07-09 DIAGNOSIS — J189 Pneumonia, unspecified organism: Secondary | ICD-10-CM

## 2018-07-09 DIAGNOSIS — I10 Essential (primary) hypertension: Secondary | ICD-10-CM | POA: Insufficient documentation

## 2018-07-09 DIAGNOSIS — J181 Lobar pneumonia, unspecified organism: Secondary | ICD-10-CM | POA: Insufficient documentation

## 2018-07-09 DIAGNOSIS — E7800 Pure hypercholesterolemia, unspecified: Secondary | ICD-10-CM | POA: Insufficient documentation

## 2018-07-09 DIAGNOSIS — Z91040 Latex allergy status: Secondary | ICD-10-CM | POA: Insufficient documentation

## 2018-07-09 DIAGNOSIS — J45909 Unspecified asthma, uncomplicated: Secondary | ICD-10-CM | POA: Insufficient documentation

## 2018-07-09 DIAGNOSIS — R0781 Pleurodynia: Secondary | ICD-10-CM

## 2018-07-09 LAB — RAPID URINE DRUG SCREEN, HOSP PERFORMED
Amphetamines: NOT DETECTED
Barbiturates: NOT DETECTED
Benzodiazepines: NOT DETECTED
Cocaine: NOT DETECTED
Opiates: NOT DETECTED
Tetrahydrocannabinol: NOT DETECTED

## 2018-07-09 MED ORDER — LIDOCAINE VISCOUS HCL 2 % MT SOLN
15.0000 mL | Freq: Once | OROMUCOSAL | Status: AC
  Administered 2018-07-09: 15 mL via ORAL
  Filled 2018-07-09: qty 15

## 2018-07-09 MED ORDER — ALUM & MAG HYDROXIDE-SIMETH 200-200-20 MG/5ML PO SUSP
30.0000 mL | Freq: Once | ORAL | Status: AC
  Administered 2018-07-09: 30 mL via ORAL
  Filled 2018-07-09: qty 30

## 2018-07-09 MED ORDER — ACETAMINOPHEN 500 MG PO TABS
1000.0000 mg | ORAL_TABLET | Freq: Once | ORAL | Status: AC
  Administered 2018-07-09: 1000 mg via ORAL
  Filled 2018-07-09: qty 2

## 2018-07-09 MED ORDER — DOXYCYCLINE HYCLATE 100 MG PO CAPS: 100.0000 mg | ORAL_CAPSULE | Freq: Two times a day (BID) | ORAL | 0 refills | Status: AC

## 2018-07-09 NOTE — ED Provider Notes (Signed)
[facility] EMERGENCY DEPARTMENT Provider Note   CSN: [id number] Arrival date & time: 07/09/18  1128    History   Chief Complaint Chief Complaint  Patient presents with  . Chest Pain    HPI [name] is a 48 y.o. male.     48 y.o male with a PMH of alcohol substance abuse, cocaine abuse, asthma presents to the ED with a chief complaint of epigastric pain and cough x 3 days. Patient reports a productive cough with green phlegm making it hard to clear his throat. He also endorses some hemoptysis. Patient states he was seen last week due to a fall and right rib pain which he reports x-ray did not show any fractures, he reports he will need "an MRI, as he read this on the Internet prior to returning to the ED.  Patient reports taking Tylenol for his pain but states no relieving symptoms.  Reports pain due to his pancreatitis which is worsening after this fall.  He denies any chest pain, shortness of breath, vomiting, fever.     Past Medical History:  Diagnosis Date  . Alcoholism /alcohol abuse ([place])   . Anemia   . Anxiety   . Arthritis    "knees; arms; elbows" (03/26/2015)  . Asthma   . Bipolar disorder ([place])   . Chronic bronchitis ([place])   . Chronic lower back pain   . Chronic pancreatitis ([place])   . Cocaine abuse ([place])   . Depression   . Family history of adverse reaction to anesthesia    "grandmother gets confused"  . Femoral condyle fracture ([place]) 03/08/2014   left medial/notes 03/09/2014  . GERD (gastroesophageal reflux disease)   . H/O hiatal hernia   . H/O suicide attempt 10/2012  . Heart murmur    "when he was little" (03/06/2013)  . High cholesterol   . History of blood transfusion 10/2012   "when I tried to commit suicide"  . History of stomach ulcers   . Hypertension   . Marijuana abuse, continuous   . Migraine    "a few times/year" (03/26/2015)  . Pneumonia 1990's X 3  . PTSD (post-traumatic stress disorder)   . Shortness of  breath    "can happen at anytime" (03/06/2013)  . Sickle cell trait ([place])   . WPW (Wolff-Parkinson-White syndrome)    [name] 03/06/2013    Patient Active Problem List   Diagnosis Date Noted  . Gastritis and gastroduodenitis   . Chest pain 01/08/2018  . GI bleed 11/24/2017  . Acute blood loss anemia 11/24/2017  . Atypical chest pain 11/24/2017  . Acute pancreatitis 09/28/2017  . Abdominal pain 05/27/2017  . Hematemesis 05/27/2017  . Tachycardia 03/18/2017  . Diarrhea 03/18/2017  . Acute on chronic pancreatitis ([place]) 12/17/2016  . Intractable nausea and vomiting 12/05/2016  . Verbally abusive behavior 12/05/2016  . Normocytic anemia 12/05/2016  . Alcohol use disorder, severe, dependence ([place]) 07/25/2016  . Cocaine use disorder, severe, dependence ([place]) 07/25/2016  . 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 Leukocytosis   . Hospital acquired PNA 05/20/2015  . Chronic pancreatitis ([place]) 05/18/2015  . Pseudocyst of pancreas 05/18/2015  . Polysubstance abuse (tobacco, cocaine, THC, and ETOH) 03/26/2015  . Alcohol-induced chronic pancreatitis ([place])   . Benign essential HTN 02/06/2014  . Alcohol-induced acute pancreatitis 11/28/2013  . Pancreatic pseudocyst/cyst 11/25/2013  . Severe protein-calorie malnutrition ([place]) 10/10/2013  . Suicide attempt ([place]) 10/08/2013  . [name] Parkinson White pattern  seen on electrocardiogram 10/03/2012  . TOBACCO ABUSE 03/23/2007    Past Surgical History:  Procedure Laterality Date  . BIOPSY  11/25/2017   Procedure: BIOPSY;  Surgeon: Arta Silence, MD;  Location: [facility];  Service: Endoscopy;;  . CARDIAC CATHETERIZATION    . ESOPHAGOGASTRODUODENOSCOPY (EGD) WITH PROPOFOL N/A 11/25/2017   Procedure: ESOPHAGOGASTRODUODENOSCOPY (EGD) WITH PROPOFOL;  Surgeon: Arta Silence, MD;  Location: [facility];  Service: Endoscopy;  Laterality: N/A;  . EYE SURGERY Left 1990's   "result of trauma"   .  FACIAL FRACTURE SURGERY Left 1990's   "result of trauma"   . FRACTURE SURGERY    . HERNIA REPAIR    . LEFT HEART CATHETERIZATION WITH CORONARY ANGIOGRAM Right 03/07/2013   Procedure: LEFT HEART CATHETERIZATION WITH CORONARY ANGIOGRAM;  Surgeon: [name], MD;  Location: [facility] CATH [facility];  Service: Cardiovascular;  Laterality: Right;  . UMBILICAL HERNIA REPAIR          Home Medications    Prior to Admission medications   Medication Sig Start Date End Date Taking? Authorizing Provider  acetaminophen (TYLENOL) 500 MG tablet Take 1,000 mg by mouth every 6 (six) hours as needed for mild pain.    [provider]  Albuterol Sulfate (PROAIR HFA IN) Inhale 2 puffs into the lungs every 6 (six) hours as needed (for shortness of breath).    [provider]  Cyanocobalamin (VITAMIN B-12 PO) Take 1 tablet by mouth daily.    [provider]  doxycycline (VIBRAMYCIN) 100 MG capsule Take 1 capsule (100 mg total) by mouth 2 (two) times daily for 7 days. 07/09/18 07/16/18  Janeece Fitting, PA-C  famotidine (PEPCID) 20 MG tablet Take 1 tablet (20 mg total) by mouth 2 (two) times daily. 04/06/18   Orlie Dakin, MD  folic acid (FOLVITE) 1 MG tablet Take 1 tablet (1 mg total) by mouth daily. 11/26/17   [name], MD  HYDRALAZINE HCL PO Take 1 tablet by mouth every 8 (eight) hours.    [provider]  lipase/protease/amylase (CREON) 12000 units CPEP capsule Take 1 capsule (12,000 Units total) by mouth 3 (three) times daily with meals. For pancreatitis 10/01/17   Aline August, MD  loratadine (CLARITIN) 10 MG tablet Take 1 tablet (10 mg total) by mouth daily as needed for allergies or rhinitis. (May purchase from over the counter): For allergies 10/01/17   Aline August, MD  metoprolol tartrate (LOPRESSOR) 25 MG tablet Take 1 tablet (25 mg total) by mouth 2 (two) times daily. For high blood pressure 08/29/17   Clent Demark, PA-C  Multiple Vitamin (MULTIVITAMIN WITH  MINERALS) TABS tablet Take 1 tablet by mouth daily. 09/06/17   Bonnell Public, MD  omeprazole (PRILOSEC) 20 MG capsule Take 1 capsule (20 mg total) by mouth daily. 03/26/18   Noemi Chapel, MD  ondansetron (ZOFRAN) 4 MG tablet Take 1 tablet (4 mg total) by mouth every 8 (eight) hours as needed for nausea or vomiting. Patient not taking: Reported on 2/94/7654 6/50/35   Delora Fuel, MD  pantoprazole (PROTONIX) 40 MG tablet Take 1 tablet (40 mg total) by mouth 2 (two) times daily. 06/19/18   Malvin Johns, MD  promethazine (PHENERGAN) 25 MG tablet Take 25 mg by mouth daily as needed for nausea or vomiting.    [provider]  QUEtiapine (SEROQUEL) 100 MG tablet Take 100 mg by mouth at bedtime.    [provider]  sertraline (ZOLOFT) 100 MG tablet Take 1 tablet (100 mg total)  by mouth daily. Patient taking differently: Take 150 mg by mouth daily.  08/29/17   Clent Demark, PA-C  sucralfate (CARAFATE) 1 g tablet Take 1 tablet (1 g total) by mouth 4 (four) times daily. 06/19/18   Malvin Johns, MD    Family History Family History  Problem Relation Age of Onset  . Hypertension Mother   . Cirrhosis Mother   . Alcoholism Mother   . Hypertension Father   . Melanoma Father   . Hypertension Other   . Coronary artery disease Other     Social History Social History   Tobacco Use  . Smoking status: Current Every Day Smoker    Packs/day: 1.00    Years: 33.00    Pack years: 33.00    Types: Cigarettes, E-cigarettes  . Smokeless tobacco: Never Used  Substance Use Topics  . Alcohol use: Yes  . Drug use: Yes    Types: Marijuana, Cocaine    Comment: daily marijuana use; last cocaine use about 3 months ago     Allergies   Robaxin [methocarbamol]; Shellfish-derived products; Trazodone; Trazodone and nefazodone; Adhesive [tape]; Latex; Toradol [ketorolac tromethamine]; Contrast media [iodinated diagnostic agents]; and Reglan [metoclopramide]   Review of Systems Review  of Systems  Constitutional: Negative for chills and fever.  HENT: Negative for ear pain and sore throat.   Eyes: Negative for pain and visual disturbance.  Respiratory: Positive for cough. Negative for shortness of breath and wheezing.   Cardiovascular: Negative for chest pain and palpitations.  Gastrointestinal: Positive for abdominal pain. Negative for nausea and vomiting.  Genitourinary: Negative for dysuria, flank pain and hematuria.  Musculoskeletal: Negative for arthralgias and back pain.  Skin: Negative for color change and rash.  Neurological: Negative for seizures and syncope.  All other systems reviewed and are negative.    Physical Exam Updated Vital Signs BP 137/90 (BP Location: Right Arm)   Pulse 98   Temp 98.3 F (36.8 C) (Oral)   Resp 16   Ht 5\' 9"  (1.753 m)   Wt 56.7 kg   SpO2 100%   BMI 18.46 kg/m   Physical Exam Vitals signs and nursing note reviewed.  Constitutional:      Appearance: He is well-developed.     Comments: Well appearing in no distress.   HENT:     Head: Normocephalic and atraumatic.  Eyes:     General: No scleral icterus.    Pupils: Pupils are equal, round, and reactive to light.  Neck:     Musculoskeletal: Normal range of motion.  Cardiovascular:     Heart sounds: Normal heart sounds.  Pulmonary:     Effort: Pulmonary effort is normal.     Breath sounds: Examination of the right-middle field reveals decreased breath sounds and rales. Examination of the left-lower field reveals decreased breath sounds. Decreased breath sounds and rales present. No wheezing or rhonchi.  Chest:     Chest wall: No tenderness.  Abdominal:     General: Bowel sounds are normal. There is no distension.     Palpations: Abdomen is soft.     Tenderness: There is no abdominal tenderness.  Musculoskeletal:        General: No tenderness or deformity.  Skin:    General: Skin is warm and dry.  Neurological:     Mental Status: He is alert and oriented to  person, place, and time.      ED Treatments / Results  Labs (all labs ordered are listed, but only abnormal  results are displayed) Labs Reviewed  RAPID URINE DRUG SCREEN, HOSP PERFORMED    EKG EKG Interpretation  Date/Time:  Monday July 09 2018 12:00:30 EST Ventricular Rate:  100 PR Interval:    QRS Duration: 77 QT Interval:  332 QTC Calculation: 429 R Axis:   75 Text Interpretation:  Sinus tachycardia Right atrial enlargement Abnormal T, consider ischemia, diffuse leads No significant change since last tracing Confirmed by Isla Pence [phone]) on 07/09/2018 12:58:22 PM   Radiology Dg Chest 2 View  Result Date: 07/09/2018 CLINICAL DATA:  Chest pain EXAM: CHEST - 2 VIEW COMPARISON:  07/03/2018 FINDINGS: Cardiac shadow is stable. The lungs are well aerated bilaterally. New right middle lobe infiltrate is noted. No sizable effusion or pneumothorax is noted. No bony abnormality is seen. IMPRESSION: New right middle lobe pneumonia. Electronically Signed   By: Inez Catalina M.D.   On: 07/09/2018 12:50    Procedures Procedures (including critical care time)  Medications Ordered in ED Medications  acetaminophen (TYLENOL) tablet 1,000 mg (1,000 mg Oral Given 07/09/18 1204)  alum & mag hydroxide-simeth (MAALOX/MYLANTA) 200-200-20 MG/5ML suspension 30 mL (30 mLs Oral Given 07/09/18 1204)    And  lidocaine (XYLOCAINE) 2 % viscous mouth solution 15 mL (15 mLs Oral Given 07/09/18 1204)     Initial Impression / Assessment and Plan / ED Course  I have reviewed the triage vital signs and the nursing notes.  Pertinent labs & imaging results that were available during my care of the patient were reviewed by me and considered in my medical decision making (see chart for details).      Patient with a previous history of pancreatitis presents to the ED with complaints of epigastric along with right-sided pain.  He reports a fall on Friday and states feeling pain along his rib region which x-ray  was negative for any fractures.  Repeated x-ray today due to rales on exam, new right middle lobe pneumonia.  Patient has a care plan in place, an EKG showed no acute changes with infarct or STEMI.  Was provided with 1000 mg of Tylenol for his pain. DG xray showed: Right middle lobe pneumonia, patient was ambulated by EMT satting at 100% during the walk.  Patient has maintained his sats above 99 while in the ED.  Blood work was obtained as patient has a care plan in place, per [address] patient can be treated outpatient.  We will place him on doxycycline twice daily for 7 days.    1:41 PM results given to patient, he is requesting tramadol for his pain, I have stated that I cannot with tramadol.  Patient "but tramadol is nonnarcotic ".  At this time patient will be discharged home with antibiotics to treat his pneumonia.  Return precautions provided at length.   Final Clinical Impressions(s) / ED Diagnoses   Final diagnoses:  Community acquired pneumonia of right middle lobe of lung [facility])    ED Discharge Orders         Ordered    doxycycline (VIBRAMYCIN) 100 MG capsule  2 times daily     07/09/18 1324           Janeece Fitting, PA-C 07/09/18 1342    Isla Pence, MD 07/09/18 1517

## 2018-07-09 NOTE — ED Notes (Signed)
Pt. Ambulated while maintaining 100% spo2 during entire event.

## 2018-07-09 NOTE — Discharge Instructions (Addendum)
Your x-ray today showed right middle lobe pneumonia.  I have prescribed antibiotics to treat your pneumonia, please take 1 tablet twice a day for the next 7 days.  You may take Tylenol to reduce your fever and also help with your other pain.

## 2018-07-09 NOTE — ED Notes (Signed)
Patient verbalizes understanding of discharge instructions. Opportunity for questioning and answers were provided. Armband removed by staff, pt discharged from ED ambulatory by self\  

## 2018-07-09 NOTE — ED Triage Notes (Signed)
Pt arrives [facility] for eval of chest/epigastric pain onset this AM, Pt is well known to this ED w/ hx of pancreatitis and persistent ETOH use. Pt denies SOB, endorses nausea, no vomiting. Pt was offered NTG by EMS but refused, requested IV morphine. PIV in place to R wrist, VSS by EMS

## 2018-07-29 IMAGING — CR DG CHEST 2V
2 series · 2 of 2 positions shown · non-contrast
Comparison: None.

CLINICAL DATA: 45 y/o M; left upper quadrant and centralized chest
pain this morning.

EXAM:
CHEST  2 VIEW

[chest pa]
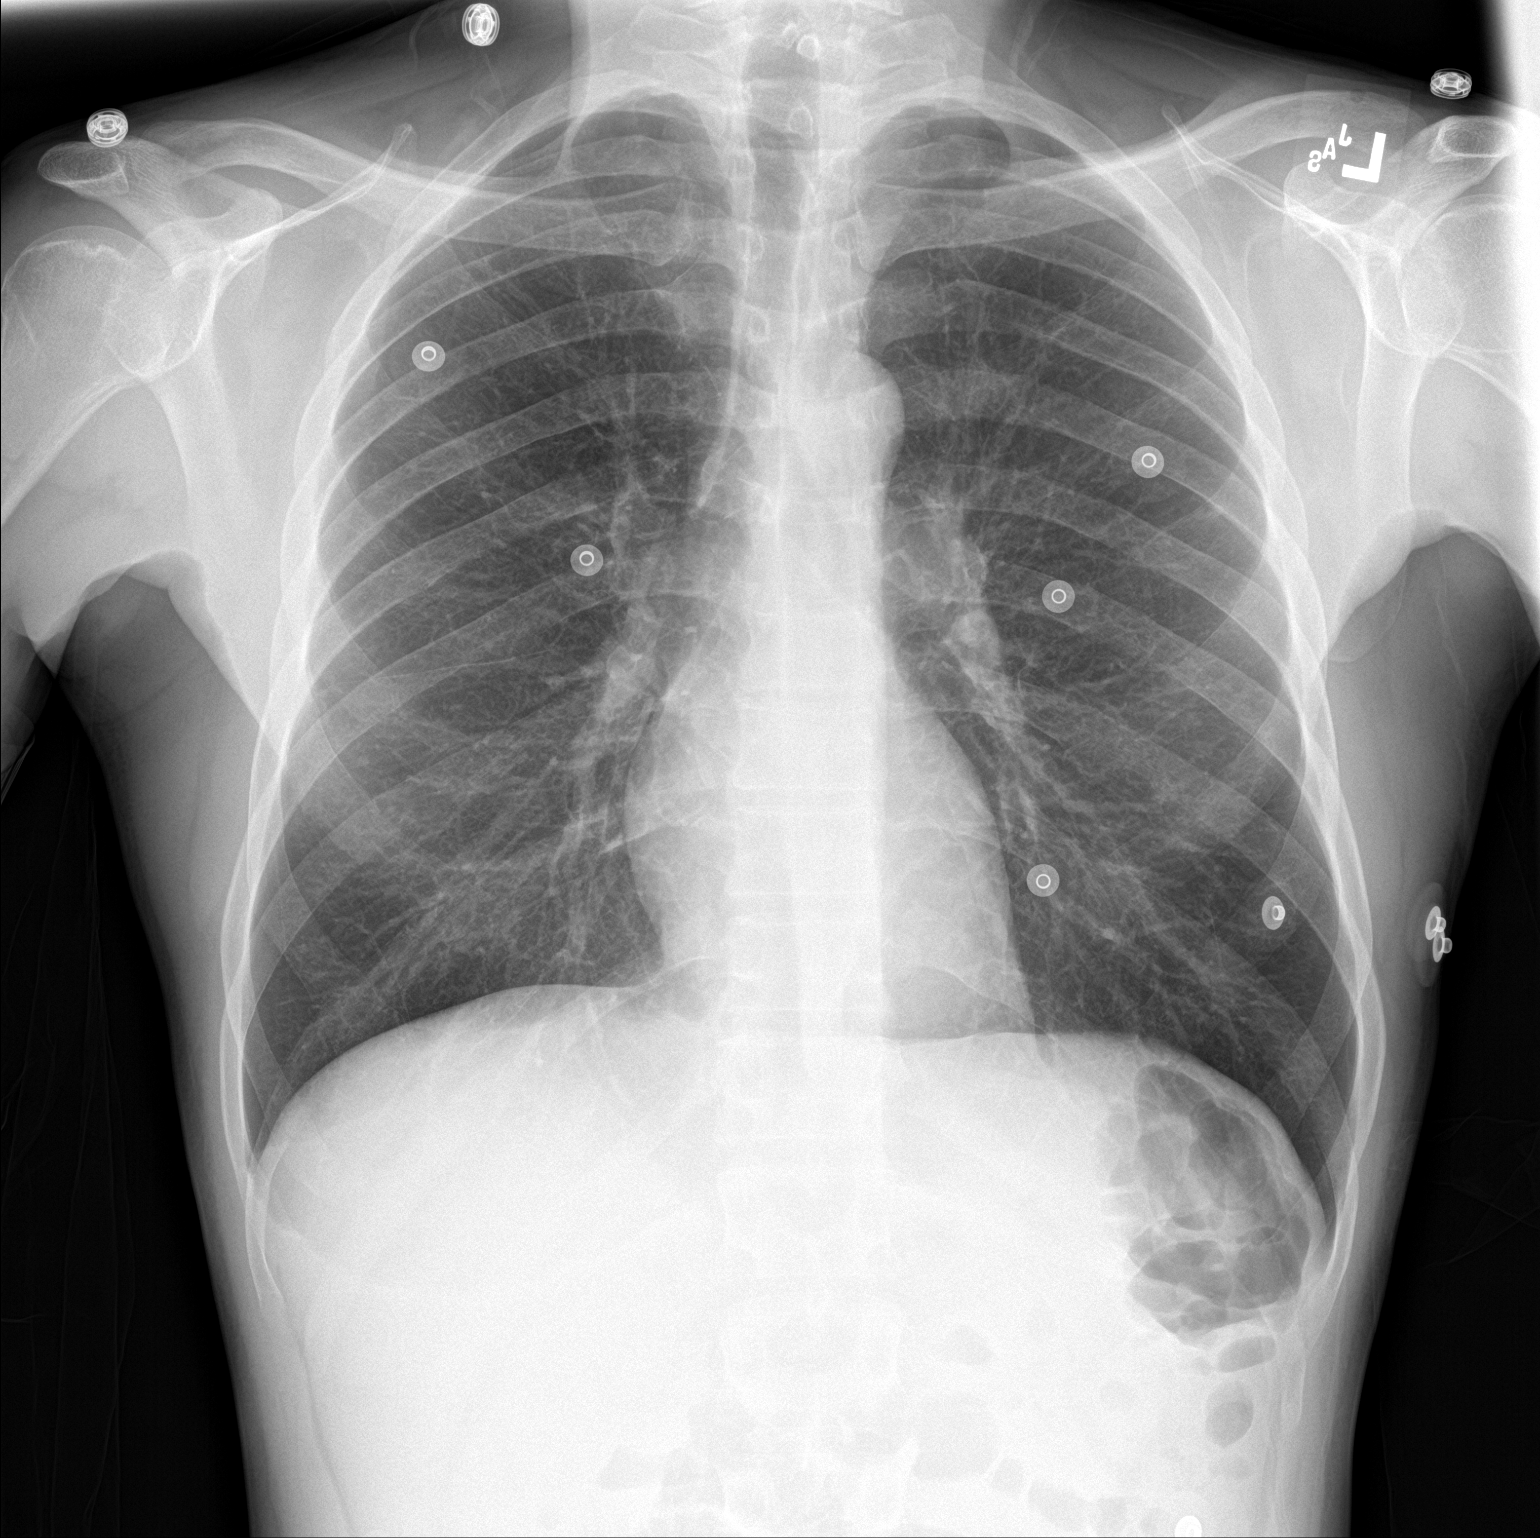

[chest lat]
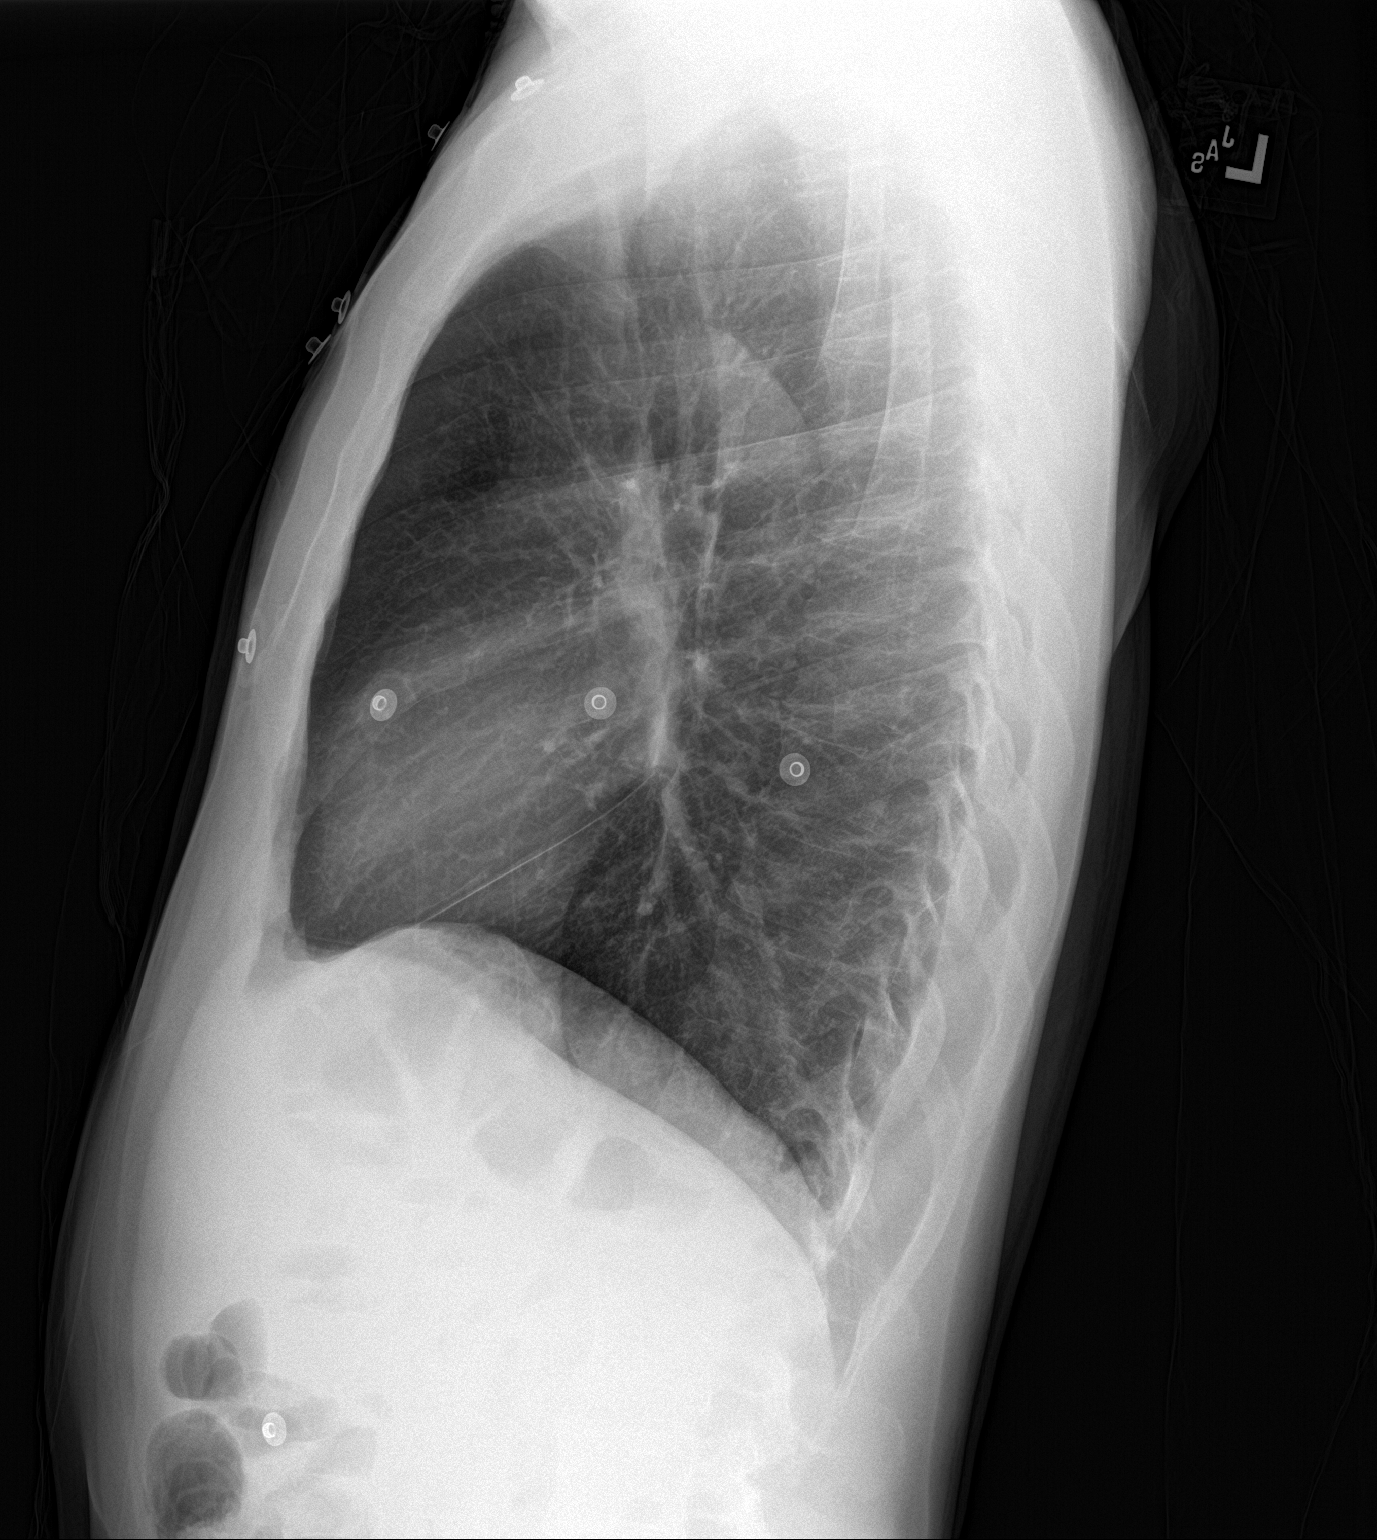

[2 of 2 positions shown; findings below may reference images not displayed]

FINDINGS: The heart size and mediastinal contours are within normal limits and
stable. Both lungs are clear. The visualized skeletal structures are
unremarkable.
IMPRESSION: No active cardiopulmonary disease.

By: Sussan Wollman M.D.

## 2018-08-02 ENCOUNTER — Encounter (HOSPITAL_COMMUNITY): Payer: Self-pay

## 2018-08-02 ENCOUNTER — Emergency Department (HOSPITAL_COMMUNITY)
Admission: EM | Admit: 2018-08-02 | Discharge: 2018-08-02 | Disposition: A | Discharge status: Home / Self Care | Payer: Self-pay | Attending: Emergency Medicine | Admitting: Emergency Medicine

## 2018-08-02 ENCOUNTER — Other Ambulatory Visit: Payer: Self-pay

## 2018-08-02 ENCOUNTER — Encounter (HOSPITAL_COMMUNITY): Payer: Self-pay | Admitting: *Deleted

## 2018-08-02 DIAGNOSIS — F17210 Nicotine dependence, cigarettes, uncomplicated: Secondary | ICD-10-CM | POA: Insufficient documentation

## 2018-08-02 DIAGNOSIS — Z91040 Latex allergy status: Secondary | ICD-10-CM | POA: Insufficient documentation

## 2018-08-02 DIAGNOSIS — K2920 Alcoholic gastritis without bleeding: Secondary | ICD-10-CM | POA: Insufficient documentation

## 2018-08-02 DIAGNOSIS — R109 Unspecified abdominal pain: Secondary | ICD-10-CM | POA: Insufficient documentation

## 2018-08-02 DIAGNOSIS — I10 Essential (primary) hypertension: Secondary | ICD-10-CM | POA: Insufficient documentation

## 2018-08-02 DIAGNOSIS — G8929 Other chronic pain: Secondary | ICD-10-CM | POA: Insufficient documentation

## 2018-08-02 DIAGNOSIS — F1010 Alcohol abuse, uncomplicated: Secondary | ICD-10-CM | POA: Insufficient documentation

## 2018-08-02 DIAGNOSIS — Z79899 Other long term (current) drug therapy: Secondary | ICD-10-CM | POA: Insufficient documentation

## 2018-08-02 DIAGNOSIS — F1020 Alcohol dependence, uncomplicated: Secondary | ICD-10-CM

## 2018-08-02 MED ORDER — ACETAMINOPHEN 325 MG PO TABS
650.0000 mg | ORAL_TABLET | Freq: Once | ORAL | Status: AC
  Administered 2018-08-02: 650 mg via ORAL
  Filled 2018-08-02: qty 2

## 2018-08-02 MED ORDER — PANTOPRAZOLE SODIUM 40 MG PO TBEC
40.00 | DELAYED_RELEASE_TABLET | ORAL | Status: DC
Start: 2018-08-01 — End: 2018-08-02

## 2018-08-02 MED ORDER — GENERIC EXTERNAL MEDICATION
100.00 | Status: DC
Start: 2018-08-02 — End: 2018-08-02

## 2018-08-02 MED ORDER — LIDOCAINE VISCOUS HCL 2 % MT SOLN
15.0000 mL | Freq: Once | OROMUCOSAL | Status: AC
  Administered 2018-08-02: 15 mL via ORAL
  Filled 2018-08-02: qty 15

## 2018-08-02 MED ORDER — LIDOCAINE 4 % EX PTCH
1.00 | MEDICATED_PATCH | CUTANEOUS | Status: DC
Start: 2018-08-02 — End: 2018-08-02

## 2018-08-02 MED ORDER — FOLIC ACID 1 MG PO TABS
1.00 | ORAL_TABLET | ORAL | Status: DC
Start: 2018-08-02 — End: 2018-08-02

## 2018-08-02 MED ORDER — ACETAMINOPHEN 500 MG PO TABS
500.00 | ORAL_TABLET | ORAL | Status: DC
Start: ? — End: 2018-08-02

## 2018-08-02 MED ORDER — AMLODIPINE BESYLATE 5 MG PO TABS
5.00 | ORAL_TABLET | ORAL | Status: DC
Start: 2018-08-01 — End: 2018-08-02

## 2018-08-02 MED ORDER — GENERIC EXTERNAL MEDICATION
1.00 | Status: DC
Start: 2018-08-02 — End: 2018-08-02

## 2018-08-02 MED ORDER — ONDANSETRON 4 MG PO TBDP
4.00 | ORAL_TABLET | ORAL | Status: DC
Start: ? — End: 2018-08-02

## 2018-08-02 MED ORDER — ALUM & MAG HYDROXIDE-SIMETH 200-200-20 MG/5ML PO SUSP
30.0000 mL | Freq: Once | ORAL | Status: AC
  Administered 2018-08-02: 30 mL via ORAL
  Filled 2018-08-02: qty 30

## 2018-08-02 MED ORDER — LORAZEPAM 1 MG PO TABS
2.00 | ORAL_TABLET | ORAL | Status: DC
Start: ? — End: 2018-08-02

## 2018-08-02 MED ORDER — SUCRALFATE 1 G PO TABS
1.0000 g | ORAL_TABLET | Freq: Once | ORAL | Status: AC
  Administered 2018-08-02: 1 g via ORAL
  Filled 2018-08-02: qty 1

## 2018-08-02 MED ORDER — FAMOTIDINE 20 MG PO TABS
20.00 | ORAL_TABLET | ORAL | Status: DC
Start: 2018-08-01 — End: 2018-08-02

## 2018-08-02 NOTE — ED Triage Notes (Signed)
Pt endorses mid abd pain radiating into the chest. Hx pancreatitis and drank alcohol last night. 8 episodes of vomiting. VSS.

## 2018-08-02 NOTE — Discharge Instructions (Signed)
Your abdominal pain, is from drinking alcohol.  Avoid all forms of alcohol.  For the abdominal pain, take Maalox before meals and at bedtime, use Tylenol if needed to help relieve the pain.  Follow-up with your primary care doctor as needed.  Also make sure you follow-up with a cardiologist, as scheduled regarding the monitor you are wearing.

## 2018-08-02 NOTE — ED Provider Notes (Signed)
[facility] DEPT Provider Note   CSN: [id number] Arrival date & time: 08/02/18  1943    History   Chief Complaint Chief Complaint  Patient presents with  . Abdominal Pain    HPI [name] is a 48 y.o. male with PMH/o Alcohol abuse, chronic pancreatitis, GERD, who presents for evaluation of chronic abdominal pain.  Patient reports that his pain started about 2 hours ago. He states it feels similar to his previous episodes of pain.  He states that he has been drinking alcohol.  Reports drinking 240 ounces last night as well as some beer this morning.  He reports that since this morning, he has had multiple episodes of nonbloody, nonbilious vomiting.  He also reports some nonbloody diarrhea.  Patient states he was seen at [facility] but states that they just give him medication and discharge him.  Patient dates he came here because he is continuing to have pain.  Patient recently admitted to [facility] for evaluation of syncope.  At that time, he had lab work done on 07/09/18 which was unremarkable.  Patient reports that during that time, he was not drinking.  Patient reports starting drinking yesterday.  Patient states he has not noted any fever, chest pain, difficulty breathing, dysuria, hematuria.     The history is provided by the patient.    Past Medical History:  Diagnosis Date  . Alcoholism /alcohol abuse ([place])   . Anemia   . Anxiety   . Arthritis    "knees; arms; elbows" (03/26/2015)  . Asthma   . Bipolar disorder ([place])   . Chronic bronchitis ([place])   . Chronic lower back pain   . Chronic pancreatitis ([place])   . Cocaine abuse ([place])   . Depression   . Family history of adverse reaction to anesthesia    "grandmother gets confused"  . Femoral condyle fracture ([place]) 03/08/2014   left medial/notes 03/09/2014  . GERD (gastroesophageal reflux disease)   . H/O hiatal hernia   . H/O suicide attempt 10/2012  . Heart murmur    "when he was  little" (03/06/2013)  . High cholesterol   . History of blood transfusion 10/2012   "when I tried to commit suicide"  . History of stomach ulcers   . Hypertension   . Marijuana abuse, continuous   . Migraine    "a few times/year" (03/26/2015)  . Pneumonia 1990's X 3  . PTSD (post-traumatic stress disorder)   . Shortness of breath    "can happen at anytime" (03/06/2013)  . Sickle cell trait ([place])   . WPW (Wolff-Parkinson-White syndrome)    [name] 03/06/2013    Patient Active Problem List   Diagnosis Date Noted  . Gastritis and gastroduodenitis   . Chest pain 01/08/2018  . GI bleed 11/24/2017  . Acute blood loss anemia 11/24/2017  . Atypical chest pain 11/24/2017  . Acute pancreatitis 09/28/2017  . Abdominal pain 05/27/2017  . Hematemesis 05/27/2017  . Tachycardia 03/18/2017  . Diarrhea 03/18/2017  . Acute on chronic pancreatitis ([place]) 12/17/2016  . Intractable nausea and vomiting 12/05/2016  . Verbally abusive behavior 12/05/2016  . Normocytic anemia 12/05/2016  . Alcohol use disorder, severe, dependence ([place]) 07/25/2016  . Cocaine use disorder, severe, dependence ([place]) 07/25/2016  . Major depressive disorder, recurrent severe without psychotic features ([place]) 07/20/2016  . Leukocytosis   . Hospital acquired PNA 05/20/2015  . Chronic pancreatitis ([place]) 05/18/2015  . Pseudocyst of pancreas 05/18/2015  . Polysubstance abuse (tobacco, cocaine,  THC, and ETOH) 03/26/2015  . Alcohol-induced chronic pancreatitis ([place])   . Benign essential HTN 02/06/2014  . Alcohol-induced acute pancreatitis 11/28/2013  . Pancreatic pseudocyst/cyst 11/25/2013  . Severe protein-calorie malnutrition ([place]) 10/10/2013  . Suicide attempt ([place]) 10/08/2013  . 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 TOBACCO ABUSE 03/23/2007    Past Surgical History:  Procedure Laterality Date  . BIOPSY  11/25/2017   Procedure: BIOPSY;  Surgeon: Arta Silence, MD;  Location: [facility];  Service: Endoscopy;;  . CARDIAC CATHETERIZATION    . ESOPHAGOGASTRODUODENOSCOPY (EGD) WITH PROPOFOL N/A 11/25/2017   Procedure: ESOPHAGOGASTRODUODENOSCOPY (EGD) WITH PROPOFOL;  Surgeon: Arta Silence, MD;  Location: [facility];  Service: Endoscopy;  Laterality: N/A;  . EYE SURGERY Left 1990's   "result of trauma"   . FACIAL FRACTURE SURGERY Left 1990's   "result of trauma"   . FRACTURE SURGERY    . HERNIA REPAIR    . LEFT HEART CATHETERIZATION WITH CORONARY ANGIOGRAM Right 03/07/2013   Procedure: LEFT HEART CATHETERIZATION WITH CORONARY ANGIOGRAM;  Surgeon: [name], MD;  Location: [facility] CATH [facility];  Service: Cardiovascular;  Laterality: Right;  . UMBILICAL HERNIA REPAIR          Home Medications    Prior to Admission medications   Medication Sig Start Date End Date Taking? Authorizing Provider  acetaminophen (TYLENOL) 500 MG tablet Take 1,000 mg by mouth every 6 (six) hours as needed for mild pain.    [provider]  Albuterol Sulfate (PROAIR HFA IN) Inhale 2 puffs into the lungs every 6 (six) hours as needed (for shortness of breath).    [provider]  Cyanocobalamin (VITAMIN B-12 PO) Take 1 tablet by mouth daily.    [provider]  famotidine (PEPCID) 20 MG tablet Take 1 tablet (20 mg total) by mouth 2 (two) times daily. 04/06/18   Orlie Dakin, MD  folic acid (FOLVITE) 1 MG tablet Take 1 tablet (1 mg total) by mouth daily. 11/26/17   [name], MD  HYDRALAZINE HCL PO Take 1 tablet by mouth every 8 (eight) hours.    [provider]  lipase/protease/amylase (CREON) 12000 units CPEP capsule Take 1 capsule (12,000 Units total) by mouth 3 (three) times daily with meals. For pancreatitis 10/01/17   Aline August, MD  loratadine (CLARITIN) 10 MG tablet Take 1 tablet (10 mg total) by mouth daily as needed for allergies or rhinitis. (May purchase from over the counter): For allergies 10/01/17   Aline August, MD  metoprolol  tartrate (LOPRESSOR) 25 MG tablet Take 1 tablet (25 mg total) by mouth 2 (two) times daily. For high blood pressure 08/29/17   Clent Demark, PA-C  Multiple Vitamin (MULTIVITAMIN WITH MINERALS) TABS tablet Take 1 tablet by mouth daily. 09/06/17   Bonnell Public, MD  omeprazole (PRILOSEC) 20 MG capsule Take 1 capsule (20 mg total) by mouth daily. 03/26/18   Noemi Chapel, MD  ondansetron (ZOFRAN) 4 MG tablet Take 1 tablet (4 mg total) by mouth every 8 (eight) hours as needed for nausea or vomiting. Patient not taking: Reported on 7/82/9562 06/07/84   Delora Fuel, MD  pantoprazole (PROTONIX) 40 MG tablet Take 1 tablet (40 mg total) by mouth 2 (two) times daily. 06/19/18   Malvin Johns, MD  promethazine (PHENERGAN) 25 MG tablet Take 25 mg by mouth daily as needed for nausea or vomiting.    [provider]  QUEtiapine (SEROQUEL) 100 MG tablet Take 100 mg by mouth  at bedtime.    [provider]  sertraline (ZOLOFT) 100 MG tablet Take 1 tablet (100 mg total) by mouth daily. Patient taking differently: Take 150 mg by mouth daily.  08/29/17   Clent Demark, PA-C  sucralfate (CARAFATE) 1 g tablet Take 1 tablet (1 g total) by mouth 4 (four) times daily. 06/19/18   Malvin Johns, MD    Family History Family History  Problem Relation Age of Onset  . Hypertension Mother   . Cirrhosis Mother   . Alcoholism Mother   . Hypertension Father   . Melanoma Father   . Hypertension Other   . Coronary artery disease Other     Social History Social History   Tobacco Use  . Smoking status: Current Every Day Smoker    Packs/day: 1.00    Years: 33.00    Pack years: 33.00    Types: Cigarettes, E-cigarettes  . Smokeless tobacco: Never Used  Substance Use Topics  . Alcohol use: Yes  . Drug use: Yes    Types: Marijuana, Cocaine    Comment: daily marijuana use; last cocaine use about 3 months ago     Allergies   Robaxin [methocarbamol]; Shellfish-derived products; Trazodone;  Trazodone and nefazodone; Adhesive [tape]; Latex; Toradol [ketorolac tromethamine]; Contrast media [iodinated diagnostic agents]; and Reglan [metoclopramide]   Review of Systems Review of Systems  Constitutional: Negative for fever.  Respiratory: Negative for cough and shortness of breath.   Cardiovascular: Negative for chest pain.  Gastrointestinal: Positive for abdominal pain, nausea and vomiting.  Genitourinary: Negative for dysuria and hematuria.  Neurological: Negative for headaches.  All other systems reviewed and are negative.    Physical Exam Updated Vital Signs BP (!) 126/93 (BP Location: Left Arm)   Pulse 86   Temp 97.8 F (36.6 C) (Oral)   Resp 16   Ht 5\' 9"  (1.753 m)   Wt 56.7 kg   SpO2 100%   BMI 18.46 kg/m   Physical Exam Vitals signs and nursing note reviewed.  Constitutional:      Appearance: Normal appearance. He is well-developed.     Comments: Patient is laying on the floor on my initial arrival.  He easily gets up and sits back in chair.  HENT:     Head: Normocephalic and atraumatic.  Eyes:     General: Lids are normal.     Conjunctiva/sclera: Conjunctivae normal.     Pupils: Pupils are equal, round, and reactive to light.  Neck:     Musculoskeletal: Full passive range of motion without pain.  Cardiovascular:     Rate and Rhythm: Normal rate and regular rhythm.     Pulses: Normal pulses.     Heart sounds: Normal heart sounds. No murmur. No friction rub. No gallop.   Pulmonary:     Effort: Pulmonary effort is normal.     Breath sounds: Normal breath sounds.     Comments: Lungs clear to auscultation bilaterally.  Symmetric chest rise.  No wheezing, rales, rhonchi. Abdominal:     Palpations: Abdomen is soft. Abdomen is not rigid.     Tenderness: There is generalized abdominal tenderness. There is no guarding.     Comments: Abdomen is soft, nondistended.  Diffuse tenderness noted.  No focal point.  No rigidity, guarding.  No active vomiting.   Musculoskeletal: Normal range of motion.  Skin:    General: Skin is warm and dry.     Capillary Refill: Capillary refill takes less than 2 seconds.  Neurological:  Mental Status: He is alert and oriented to person, place, and time.  Psychiatric:        Speech: Speech normal.      ED Treatments / Results  Labs (all labs ordered are listed, but only abnormal results are displayed) Labs Reviewed - No data to display  EKG None  Radiology No results found.  Procedures Procedures (including critical care time)  Medications Ordered in ED Medications  lidocaine (XYLOCAINE) 2 % viscous mouth solution 15 mL (has no administration in time range)  sucralfate (CARAFATE) tablet 1 g (1 g Oral Given 08/02/18 2114)     Initial Impression / Assessment and Plan / ED Course  I have reviewed the triage vital signs and the nursing notes.  Pertinent labs & imaging results that were available during my care of the patient were reviewed by me and considered in my medical decision making (see chart for details).        48 year old male past history of alcohol abuse, chronic pancreatitis who presents to the emergency department for evaluation abdominal pain.  Patient reports drinking alcohol last night and this morning.  Endorses vomiting though no active vomiting noted on my evaluation.  Patient was recently seen at [facility] ED about 4 hours ago.  At that time, he was given a GI cocktail and was discharged home.  Patient states that he came here because "they did not do anything."  Patient is afebrile, appears in no acute distress.  Vital signs are stable.  Patient is well-known to the ED with over 17 visits to the ED in the last 6 months. Patient with history of chronic abdominal pain worsened with alcohol use.  He does not appear acutely intoxicated here in ED.  He answers questions appropriately and has no slurring of speech.  Patient appears clinically sober.  At this time, suspect this is an  acute exacerbation of his chronic abdominal pain.  Patient with no active vomiting at this time.  No indication for lab work.  History/physical exam is not concerning for surgical abdomen.  Indication for CT abdomen pelvis at this time.  Plan to give Carafate and discharged home. Patient had ample opportunity for questions and discussion. All patient's questions were answered with full understanding. Strict return precautions discussed. Patient expresses understanding and agreement to plan.   Portions of this note were generated with [employer]obby[employer]. Dictation errors may occur despite best attempts at proofreading.   Final Clinical Impressions(s) / ED Diagnoses   Final diagnoses:  Chronic abdominal pain    ED Discharge Orders    None       Desma Mcgregor 08/02/18 2312    Blanchie Dessert, MD 08/04/18 1024

## 2018-08-02 NOTE — ED Notes (Signed)
Pt verbalized understanding of d/c instructions and has no further questions, VSS, NAD. Pt encouraged to stop drinking alcohol. Pt stated "they didn't even check my levels today" Pt has known chronic pancreatitis from chronic alcohol abuse. Pt is stable, ambulatory and in NAD at this time. Pt can take tylenol and maalox at home.

## 2018-08-02 NOTE — ED Triage Notes (Signed)
Pt just left [facility] after assessment by Dr Eulis Foster to come here to see if he can get blood work and meds for his abd pain which is chronic and he continues to drink ETOH which induces his pain. Reports vomiting but none noted in triage.

## 2018-08-02 NOTE — Discharge Instructions (Signed)
Follow-up with [facility] to establish a primary care doctor if you do not have one.   Return emergency department for any worsening pain, fever, vomiting or any other worsening concerning symptoms.

## 2018-08-02 NOTE — ED Notes (Signed)
Bed: [facility]PT1 Expected date:  Expected time:  Means of arrival:  Comments: 

## 2018-08-02 NOTE — ED Provider Notes (Signed)
[facility] EMERGENCY DEPARTMENT Provider Note   CSN: [id number] Arrival date & time: 08/02/18  1729    History   Chief Complaint Chief Complaint  Patient presents with   Abdominal Pain    HPI [name] is a 48 y.o. male.     HPI   The patient presents for recurrent abdominal pain.  Patient is an alcoholic, drinks frequently and has multiple episodes of pancreatitis.  The patient has a care plan which has been reviewed.  Patient also reports vomiting.  He was discharged from the hospital, [facility], yesterday after admission for syncope.  He was placed on a cardiac monitor patch to wear for possible arrhythmias.  He has outpatient follow-up with cardiology scheduled.  He presents today for abdominal pain and vomiting which started after he "ate a sub-."  He stated he stated his home last night.  He admits to drinking multiple beverages with alcohol in it last night.  He thinks his pancreatitis has come back.  The patient denies sneezing, coughing, sore throat, chest pain, shortness of breath, weakness or dizziness.  There are no other known modifying factors.  Past Medical History:  Diagnosis Date   Alcoholism /alcohol abuse ([place])    Anemia    Anxiety    Arthritis    "knees; arms; elbows" (03/26/2015)   Asthma    Bipolar disorder (HCC)    Chronic bronchitis (HCC)    Chronic lower back pain    Chronic pancreatitis ([place])    Cocaine abuse ([place])    Depression    Family history of adverse reaction to anesthesia    "grandmother gets confused"   Femoral condyle fracture ([place]) 03/08/2014   left medial/notes 03/09/2014   GERD (gastroesophageal reflux disease)    H/O hiatal hernia    H/O suicide attempt 10/2012   Heart murmur    "when he was little" (03/06/2013)   High cholesterol    History of blood transfusion 10/2012   "when I tried to commit suicide"   History of stomach ulcers    Hypertension     Marijuana abuse, continuous    Migraine    "a few times/year" (03/26/2015)   Pneumonia 1990's X 3   PTSD (post-traumatic stress disorder)    Shortness of breath    "can happen at anytime" (03/06/2013)   Sickle cell trait ([place])    WPW (Wolff-Parkinson-White syndrome)    [name] 03/06/2013    Patient Active Problem List   Diagnosis Date Noted   Gastritis and gastroduodenitis    Chest pain 01/08/2018   GI bleed 11/24/2017   Acute blood loss anemia 11/24/2017   Atypical chest pain 11/24/2017   Acute pancreatitis 09/28/2017   Abdominal pain 05/27/2017   Hematemesis 05/27/2017   Tachycardia 03/18/2017   Diarrhea 03/18/2017   Acute on chronic pancreatitis ([place]) 12/17/2016   Intractable nausea and vomiting 12/05/2016   Verbally abusive behavior 12/05/2016   Normocytic anemia 12/05/2016   Alcohol use disorder, severe, dependence ([place]) 07/25/2016   Cocaine use disorder, severe, dependence ([place]) 07/25/2016   Major depressive disorder, 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   Leukocytosis    Hospital acquired PNA 05/20/2015   Chronic pancreatitis ([place]) 05/18/2015   Pseudocyst of pancreas 05/18/2015   Polysubstance abuse (tobacco, cocaine, THC, and ETOH) 03/26/2015   Alcohol-induced chronic pancreatitis ([place])    Benign essential HTN 02/06/2014   Alcohol-induced acute pancreatitis 11/28/2013   Pancreatic pseudocyst/cyst 11/25/2013   Severe protein-calorie malnutrition (  [place]) 10/10/2013   Suicide attempt ([place]) 10/08/2013   [name] Parkinson White pattern seen on electrocardiogram 10/03/2012   TOBACCO ABUSE 03/23/2007    Past Surgical History:  Procedure Laterality Date   BIOPSY  11/25/2017   Procedure: BIOPSY;  Surgeon: Arta Silence, MD;  Location: [place];  Service: Endoscopy;;   CARDIAC CATHETERIZATION     ESOPHAGOGASTRODUODENOSCOPY (EGD) WITH PROPOFOL N/A 11/25/2017   Procedure: ESOPHAGOGASTRODUODENOSCOPY (EGD)  WITH PROPOFOL;  Surgeon: Arta Silence, MD;  Location[facility] ENDOSCOPY;  Service: Endoscopy;  Laterality: N/A;   EYE SURGERY Left 1990's   "result of trauma"    FACIAL FRACTURE SURGERY Left 1990's   "result of trauma"    FRACTURE SURGERY     HERNIA REPAIR     LEFT HEART CATHETERIZATION WITH CORONARY ANGIOGRAM Right 03/07/2013   Procedure: LEFT HEART CATHETERIZATION WITH CORONARY ANGIOGRAM;  Surgeon: [name], MD;  Location: [place] CATH LAB;  Service: Cardiovascular;  Laterality: Right;   UMBILICAL HERNIA REPAIR          Home Medications    Prior to Admission medications   Medication Sig Start Date End Date Taking? Authorizing Provider  acetaminophen (TYLENOL) 500 MG tablet Take 1,000 mg by mouth every 6 (six) hours as needed for mild pain.    [provider]  Albuterol Sulfate (PROAIR HFA IN) Inhale 2 puffs into the lungs every 6 (six) hours as needed (for shortness of breath).    [provider]  Cyanocobalamin (VITAMIN B-12 PO) Take 1 tablet by mouth daily.    [provider]  famotidine (PEPCID) 20 MG tablet Take 1 tablet (20 mg total) by mouth 2 (two) times daily. 04/06/18   Orlie Dakin, MD  folic acid (FOLVITE) 1 MG tablet Take 1 tablet (1 mg total) by mouth daily. 11/26/17   [name], MD  HYDRALAZINE HCL PO Take 1 tablet by mouth every 8 (eight) hours.    [provider]  lipase/protease/amylase (CREON) 12000 units CPEP capsule Take 1 capsule (12,000 Units total) by mouth 3 (three) times daily with meals. For pancreatitis 10/01/17   Aline August, MD  loratadine (CLARITIN) 10 MG tablet Take 1 tablet (10 mg total) by mouth daily as needed for allergies or rhinitis. (May purchase from over the counter): For allergies 10/01/17   Aline August, MD  metoprolol tartrate (LOPRESSOR) 25 MG tablet Take 1 tablet (25 mg total) by mouth 2 (two) times daily. For high blood pressure 08/29/17   Clent Demark, PA-C  Multiple Vitamin  (MULTIVITAMIN WITH MINERALS) TABS tablet Take 1 tablet by mouth daily. 09/06/17   Bonnell Public, MD  omeprazole (PRILOSEC) 20 MG capsule Take 1 capsule (20 mg total) by mouth daily. 03/26/18   Noemi Chapel, MD  ondansetron (ZOFRAN) 4 MG tablet Take 1 tablet (4 mg total) by mouth every 8 (eight) hours as needed for nausea or vomiting. Patient not taking: Reported on 8/34/1962 2/29/79   Delora Fuel, MD  pantoprazole (PROTONIX) 40 MG tablet Take 1 tablet (40 mg total) by mouth 2 (two) times daily. 06/19/18   Malvin Johns, MD  promethazine (PHENERGAN) 25 MG tablet Take 25 mg by mouth daily as needed for nausea or vomiting.    [provider]  QUEtiapine (SEROQUEL) 100 MG tablet Take 100 mg by mouth at bedtime.    [provider]  sertraline (ZOLOFT) 100 MG tablet Take 1 tablet (100 mg total) by mouth daily. Patient taking differently: Take 150 mg by mouth daily.  08/29/17   Clent Demark, PA-C  sucralfate (CARAFATE) 1 g tablet Take 1 tablet (1 g total) by mouth 4 (four) times daily. 06/19/18   Malvin Johns, MD    Family History Family History  Problem Relation Age of Onset   Hypertension Mother    Cirrhosis Mother    Alcoholism Mother    Hypertension Father    Melanoma Father    Hypertension Other    Coronary artery disease Other     Social History Social History   Tobacco Use   Smoking status: Current Every Day Smoker    Packs/day: 1.00    Years: 33.00    Pack years: 33.00    Types: Cigarettes, E-cigarettes   Smokeless tobacco: Never Used  Substance Use Topics   Alcohol use: Yes   Drug use: Yes    Types: Marijuana, Cocaine    Comment: daily marijuana use; last cocaine use about 3 months ago     Allergies   Robaxin [methocarbamol]; Shellfish-derived products; Trazodone; Trazodone and nefazodone; Adhesive [tape]; Latex; Toradol [ketorolac tromethamine]; Contrast media [iodinated diagnostic agents]; and Reglan [metoclopramide]   Review  of Systems Review of Systems  All other systems reviewed and are negative.    Physical Exam Updated Vital Signs BP 115/81 (BP Location: Right Arm)    Pulse 86    Temp 98.3 F (36.8 C) (Oral)    Resp 16    SpO2 100%   Physical Exam Vitals signs and nursing note reviewed.  Constitutional:      General: He is not in acute distress.    Appearance: He is well-developed. He is not ill-appearing or toxic-appearing.  HENT:     Head: Normocephalic and atraumatic.     Right Ear: External ear normal.     Left Ear: External ear normal.     Nose: No congestion or rhinorrhea.     Mouth/Throat:     Pharynx: No oropharyngeal exudate or posterior oropharyngeal erythema.  Eyes:     Conjunctiva/sclera: Conjunctivae normal.     Pupils: Pupils are equal, round, and reactive to light.  Neck:     Musculoskeletal: Normal range of motion and neck supple.     Trachea: Phonation normal.  Cardiovascular:     Rate and Rhythm: Normal rate and regular rhythm.     Heart sounds: Normal heart sounds.  Pulmonary:     Effort: Pulmonary effort is normal.     Breath sounds: Normal breath sounds.  Abdominal:     General: There is no distension.     Palpations: Abdomen is soft.     Tenderness: There is abdominal tenderness (Epigastric, mild). There is no guarding or rebound.     Hernia: No hernia is present.  Musculoskeletal: Normal range of motion.        General: No swelling or tenderness.  Skin:    General: Skin is warm and dry.  Neurological:     Mental Status: He is alert and oriented to person, place, and time.     Cranial Nerves: No cranial nerve deficit.     Sensory: No sensory deficit.     Motor: No abnormal muscle tone.     Coordination: Coordination normal.  Psychiatric:        Mood and Affect: Mood normal.        Behavior: Behavior normal.        Thought Content: Thought content normal.        Judgment: Judgment normal.  ED Treatments / Results  Labs (all labs ordered are listed,  but only abnormal results are displayed) Labs Reviewed - No data to display  EKG EKG Interpretation  Date/Time:  Thursday August 02 2018 17:33:33 EDT Ventricular Rate:  84 PR Interval:    QRS Duration: 76 QT Interval:  352 QTC Calculation: 416 R Axis:   76 Text Interpretation:  Sinus rhythm Short PR interval Biatrial enlargement Abnormal T, consider ischemia, diffuse leads Minimal ST elevation, anterior leads since last tracing no significant change Confirmed by Daleen Bo [phone]) on 08/02/2018 5:43:02 PM   Radiology No results found.  Procedures Procedures (including critical care time)  Medications Ordered in ED Medications  alum & mag hydroxide-simeth (MAALOX/MYLANTA) 200-200-20 MG/5ML suspension 30 mL (has no administration in time range)  acetaminophen (TYLENOL) tablet 650 mg (has no administration in time range)     Initial Impression / Assessment and Plan / ED Course  I have reviewed the triage vital signs and the nursing notes.  Pertinent labs & imaging results that were available during my care of the patient were reviewed by me and considered in my medical decision making (see chart for details).         Patient Vitals for the past 24 hrs:  BP Temp Temp src Pulse Resp SpO2  08/02/18 1731 115/81 98.3 F (36.8 C) Oral 86 16 100 %      Medical Decision Making: Sascha Noha Karasik was evaluated in Emergency Department on 08/02/2018 for the symptoms described in the history of present illness. He was evaluated in the context of the global COVID-19 pandemic, which necessitated consideration that the patient might be at risk for infection with the SARS-CoV-2 virus that causes COVID-19. Institutional protocols and algorithms that pertain to the evaluation of patients at risk for COVID-19 are in a state of rapid change based on information released by regulatory bodies including the CDC and federal and state organizations. These policies and algorithms were  followed during the patient's care in the ED.  Patient presenting with signs and symptoms of alcoholic gastritis.  Doubt pancreatitis.  Patient was alcohol free for the 48 hours prior to yesterday evening, when he began drinking alcohol.  During that time he was hospitalized.  It is unlikely that he is developed an acute pancreatic problem, overnight.  Patient will be treated symptomatically and discharged.   CRITICAL CARE-no Performed by: Daleen Bo  Nursing Notes Reviewed/ Care Coordinated Applicable Imaging Reviewed Interpretation of Laboratory Data incorporated into ED treatment  The patient appears reasonably screened and/or stabilized for discharge and I doubt any other medical condition or other [facility] requiring further screening, evaluation, or treatment in the ED at this time prior to discharge.  Plan: Home Medications-OTC medications to treat symptoms.; Home Treatments-avoid all forms of alcohol; return here if the recommended treatment, does not improve the symptoms; Recommended follow up-follow-up with PCP, as needed and as scheduled.  See cardiology services regarding the pacemaker which is attached to the chest currently.    Final Clinical Impressions(s) / ED Diagnoses   Final diagnoses:  Acute alcoholic gastritis without hemorrhage  Alcoholism [facility])    ED Discharge Orders    None       Daleen Bo, MD 08/02/18 1806

## 2018-08-12 IMAGING — DX DG CHEST 2V
2 series · 2 of 2 positions shown · non-contrast
Comparison: Chest x-rays dated 02/06/2016 and 05/19/2015.

CLINICAL DATA: Pt complains of Chest pain on left side, radiating
into left shoulder. Pt also complains of dizziness and diarrhea all
of which began this morning. Pt states he suffers from hypertension,
Shortness of breath and WPW. Pt also has history of hiatal hernia.

EXAM:
CHEST  2 VIEW

[chest pa]
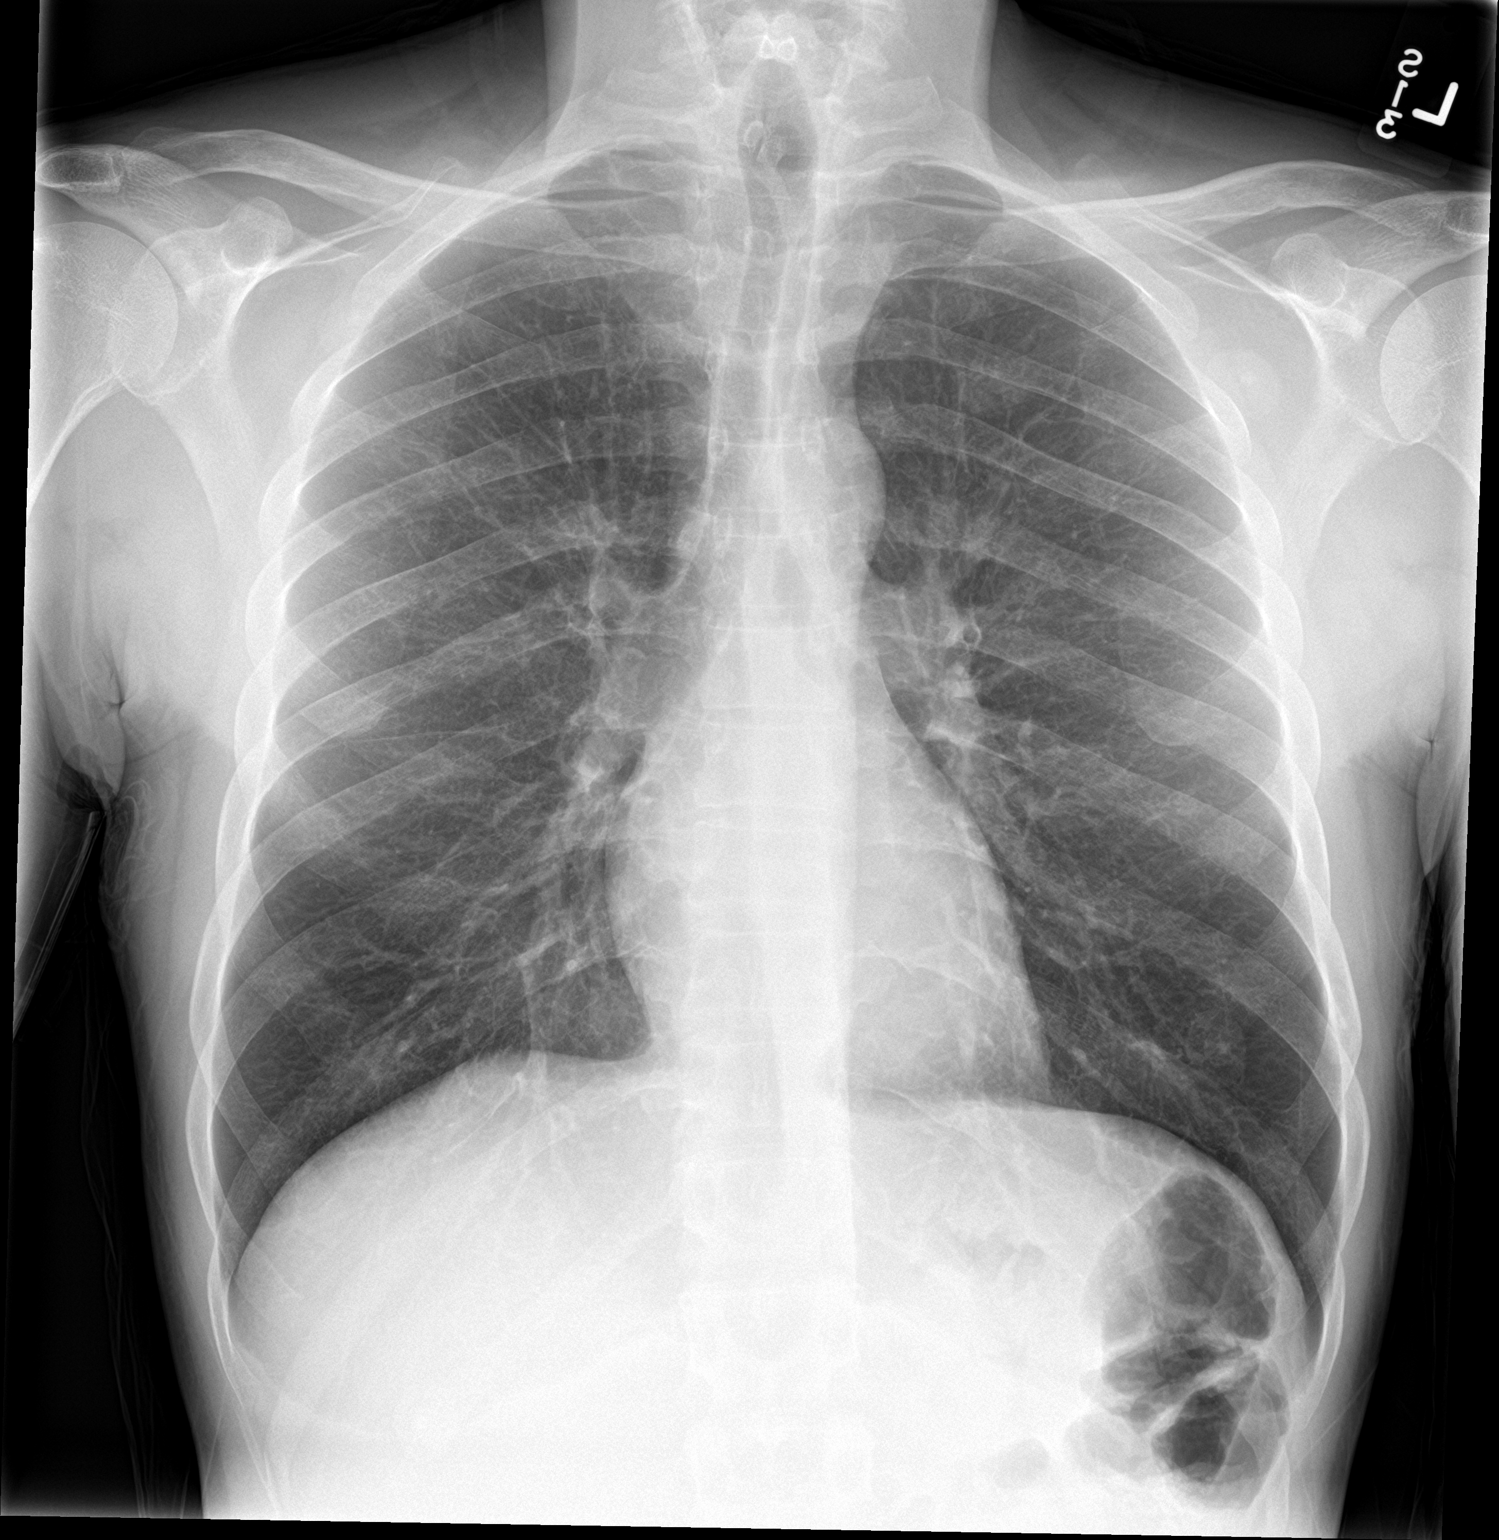

[chest lat]
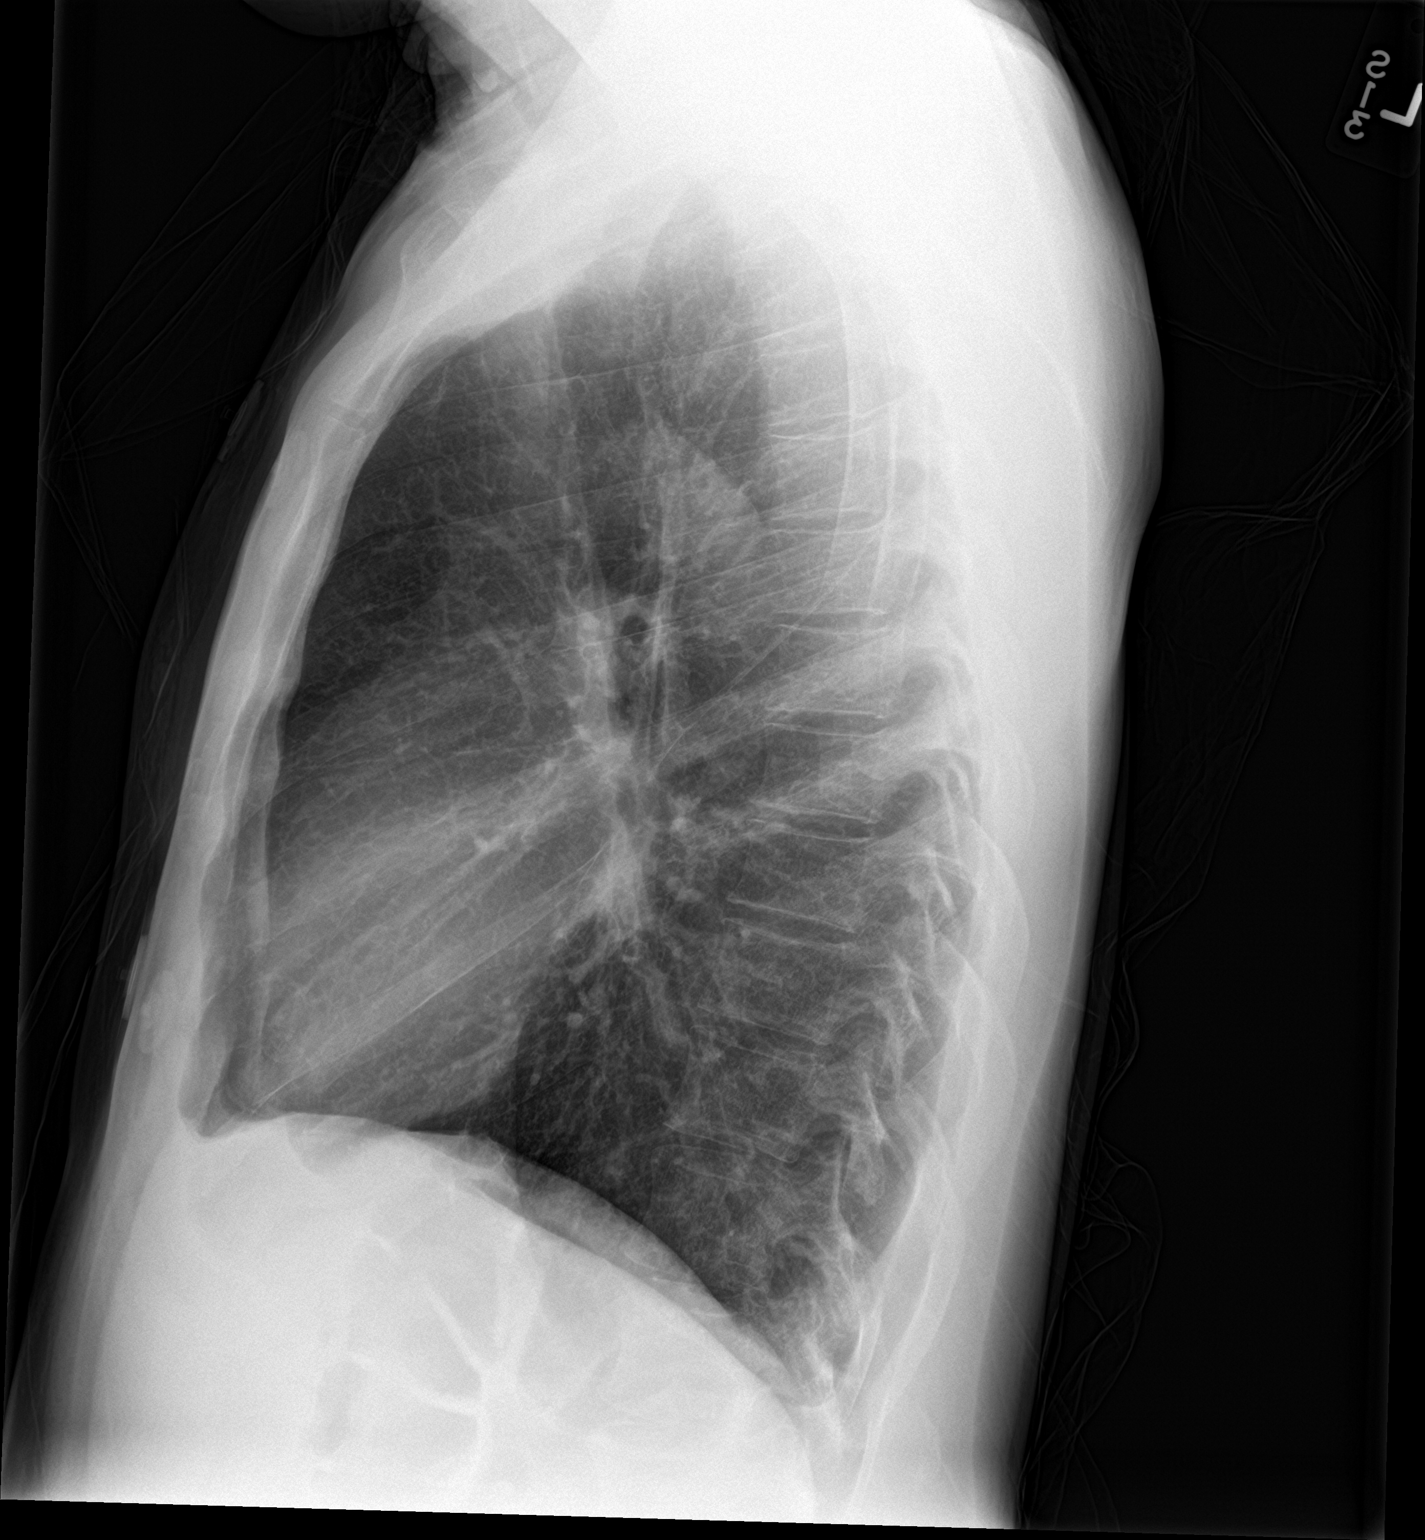

[2 of 2 positions shown; findings below may reference images not displayed]

FINDINGS: Heart size and mediastinal contours are normal. Lungs are clear. No
pleural effusion or pneumothorax seen. Osseous and soft tissue
structures about the chest are unremarkable.
IMPRESSION: No active cardiopulmonary disease.  No evidence of pneumonia.

## 2018-08-13 IMAGING — CT CT NECK W/ CM
4 of 5 series · 16 of 33 positions shown, 18 images · IV contrast (iopamidol)
Comparison: 09/13/2013 CT neck.

CLINICAL DATA: 45 y/o M; pea-sized nodule on the left side of neck
for about 2 weeks which double in size yesterday.

EXAM:
CT NECK WITH CONTRAST
TECHNIQUE: Multidetector CT imaging of the neck was performed using the
standard protocol following the bolus administration of intravenous
contrast.
CONTRAST:  1 RMLCU5-NYY IOPAMIDOL (RMLCU5-NYY) INJECTION 61%

[Series 201: soft tissue, idose (2) · axial · 0.47mm/px · z∈[-5,+147]mm · 4 of 128 slices shown]
[im 26/128  soft-tissue]
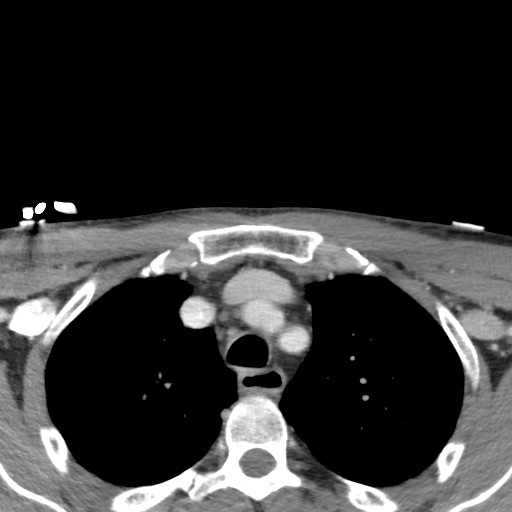
[im 51/128  soft-tissue]
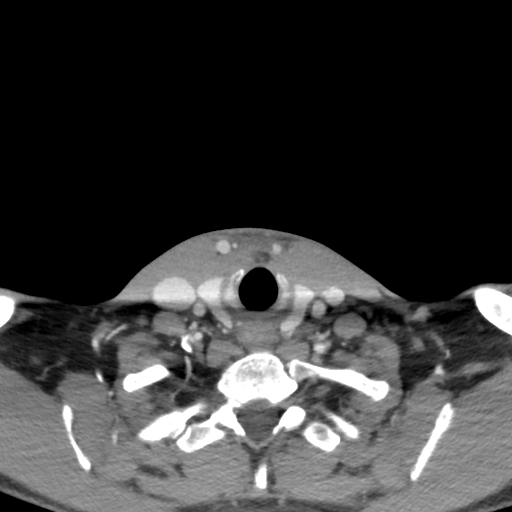
[im 77/128  soft-tissue]
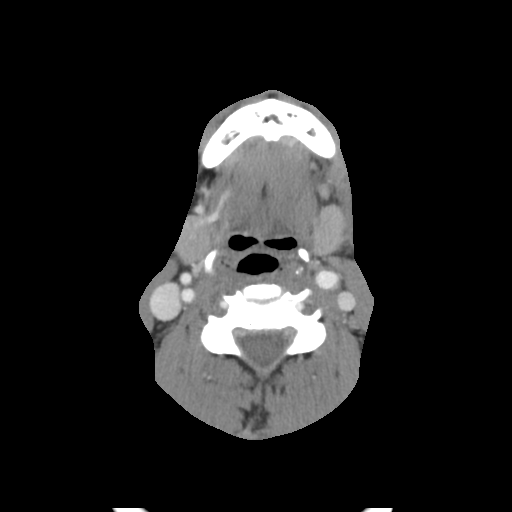
[im 102/128  soft-tissue]
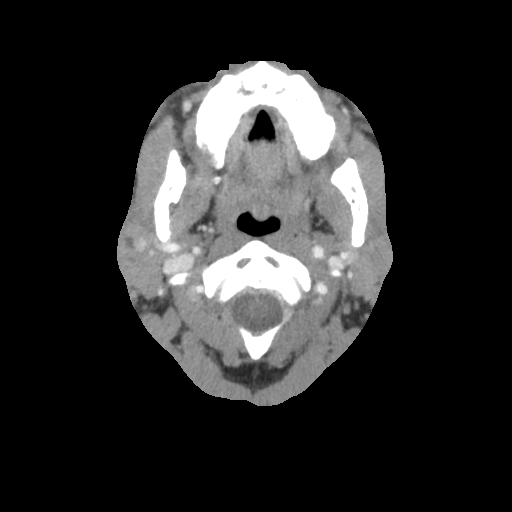

[Series 203: coronal, idose (2) · coronal · 0.45mm/px · 3 of 85 slices shown]
[im 17/85  bone]
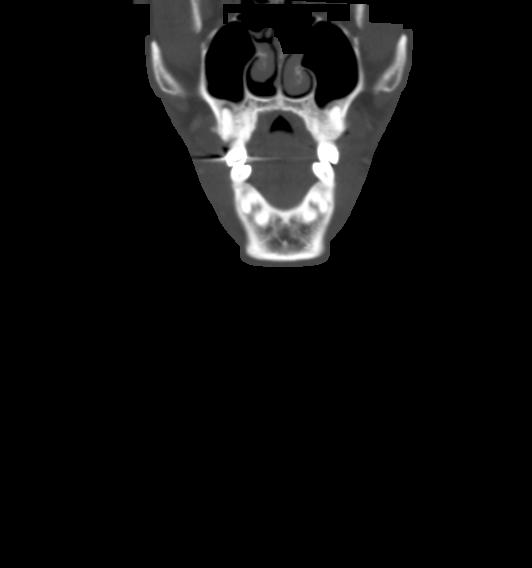
[im 34/85  bone]
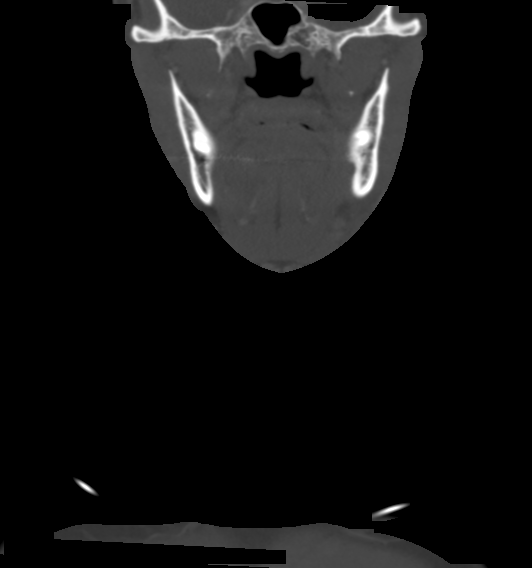
[im 51/85  bone]
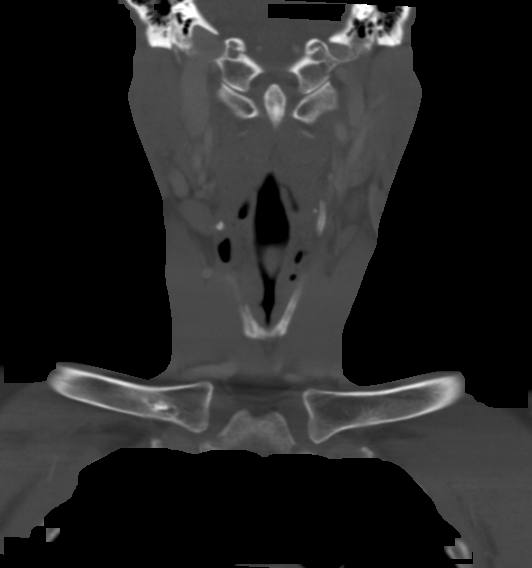

[Series 204: sagittal, idose (2) · sagittal · 0.45mm/px · 5 of 68 slices shown, 6 images]
[im 23/68  bone]
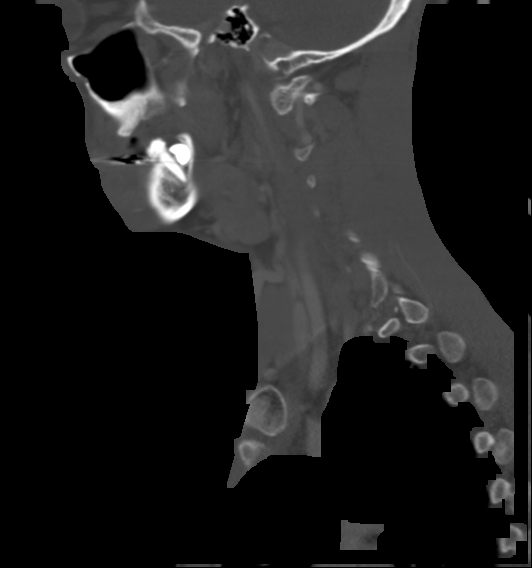
[im 28/68  bone]
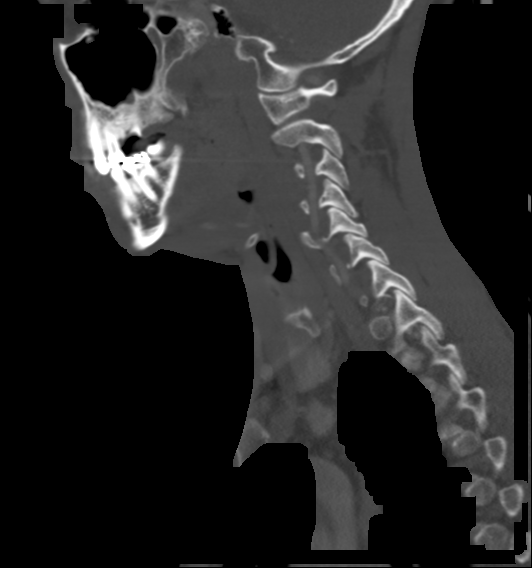
[im 34/68  soft-tissue]
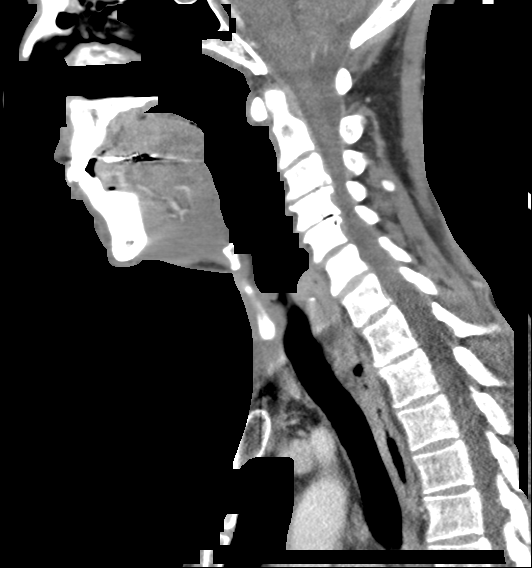
[im 34/68  bone]
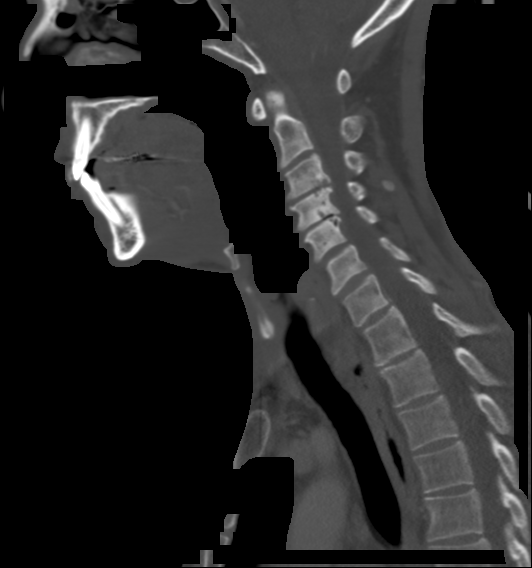
[im 40/68  bone]
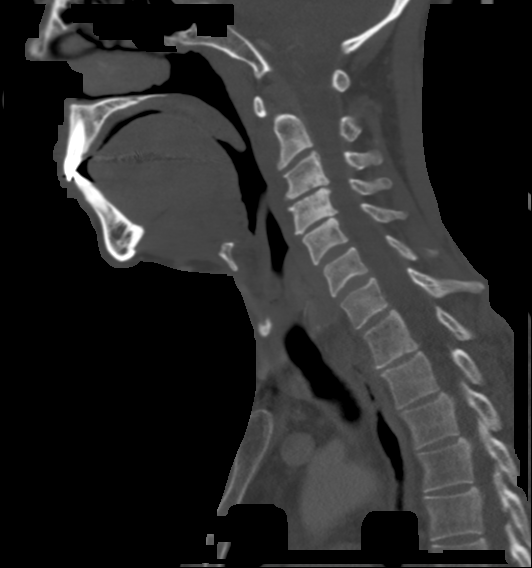
[im 45/68  bone]
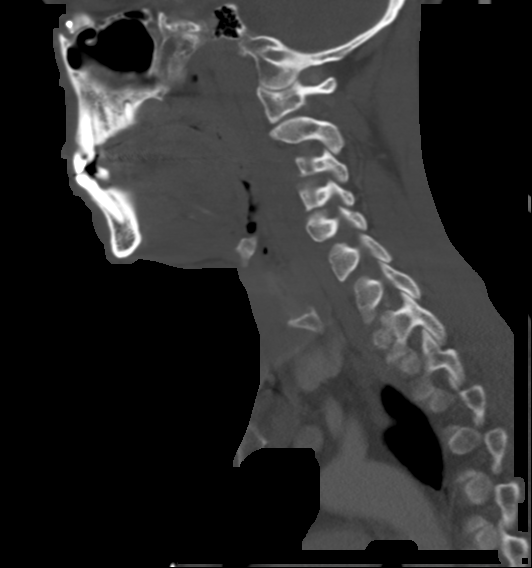

[Series 205: orthogonal, idose (2) · axial · 0.61mm/px · z∈[-38,+102]mm · 4 of 126 slices shown, 5 images]
[im 26/126  soft-tissue]
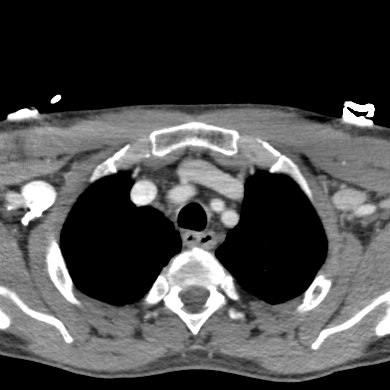
[im 26/126  bone]
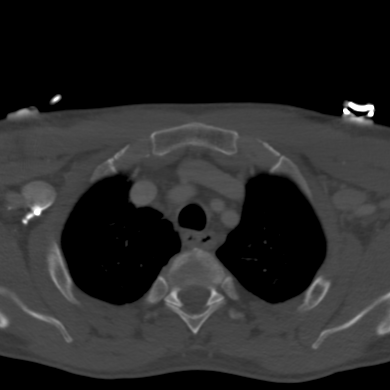
[im 51/126  bone]
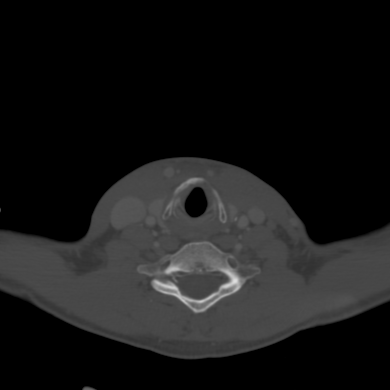
[im 76/126  bone]
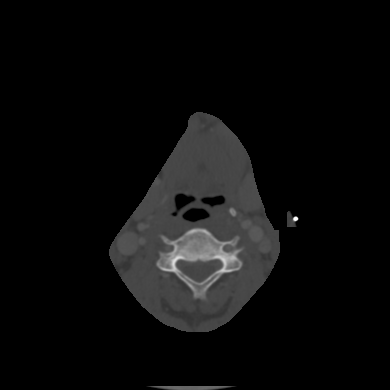
[im 101/126  bone]
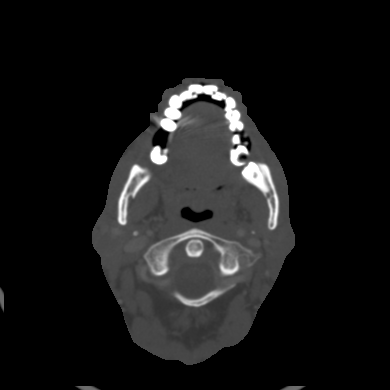

[16 of 33 positions shown; findings below may reference images not displayed]

FINDINGS: Pharynx and larynx: Normal. No mass or swelling. Bilateral right
greater than left internal laryngocele, unchanged.

Salivary glands: No inflammation, mass, or stone.

Thyroid: Normal.

Lymph nodes: None enlarged or abnormal density.

Vascular: Mild plaque of left common carotid artery. No significant
stenosis of carotid or vertebral arteries of the neck. Patent
internal jugular veins.

Limited intracranial: Negative.

Visualized orbits: Not included in the field of view appear

Mastoids and visualized paranasal sinuses: Clear.

Skeleton: Mild cervical spondylosis greatest at the C4 through C5
level where there are endplate degenerative changes and disc space
narrowing. No high-grade bony canal stenosis or foraminal narrowing.

Left maxillary and mandibular molar dental Ebadat and periapical
cysts with right maxillary molar absent crown with periapical cyst
compatible with odontogenic disease.

Upper chest: Mild centrilobular and paraseptal emphysema of lung
apices. 3 mm right upper lobe nodule (series 206, image 21).

Other: At the site of BB marker and the left anterior neck overlying
sternocleidomastoid muscle no mass or abnormal enhancement is
identified. There are subjacent normal sized lymph nodes.
IMPRESSION: 1. No discrete mass or abnormal enhancement identified in the region
of interest marked by the BB. Subjacent normal size lymph nodes.
2. Odontogenic disease of molars bilaterally.
3. Mild emphysema of the lung apices.
4. 3 mm right upper lobe nodule. No follow-up needed if patient is
low-risk. Non-contrast chest CT can be considered in 12 months if
patient is high-risk. This recommendation follows the consensus
statement: Guidelines for Management of Incidental Pulmonary Nodules
Detected on CT Images: From the [HOSPITAL] 5199; Radiology
5199; [DATE].
5. Mild cervical spondylosis.
6. Mixed plaque of left common carotid artery. No significant
stenosis.

By: Asutani Massup M.D.

## 2018-10-13 ENCOUNTER — Inpatient Hospital Stay (HOSPITAL_COMMUNITY)
Admission: EM | Admit: 2018-10-13 | Discharge: 2018-10-15 | DRG: 377 | Disposition: A | Discharge status: Home / Self Care | Payer: Self-pay | Attending: Internal Medicine | Admitting: Internal Medicine

## 2018-10-13 ENCOUNTER — Other Ambulatory Visit: Payer: Self-pay

## 2018-10-13 DIAGNOSIS — Z8711 Personal history of peptic ulcer disease: Secondary | ICD-10-CM

## 2018-10-13 DIAGNOSIS — F419 Anxiety disorder, unspecified: Secondary | ICD-10-CM | POA: Diagnosis present

## 2018-10-13 DIAGNOSIS — E43 Unspecified severe protein-calorie malnutrition: Secondary | ICD-10-CM | POA: Diagnosis present

## 2018-10-13 DIAGNOSIS — Z8701 Personal history of pneumonia (recurrent): Secondary | ICD-10-CM

## 2018-10-13 DIAGNOSIS — Z20828 Contact with and (suspected) exposure to other viral communicable diseases: Secondary | ICD-10-CM | POA: Diagnosis present

## 2018-10-13 DIAGNOSIS — F332 Major depressive disorder, recurrent severe without psychotic features: Secondary | ICD-10-CM | POA: Diagnosis present

## 2018-10-13 DIAGNOSIS — N179 Acute kidney failure, unspecified: Secondary | ICD-10-CM | POA: Diagnosis present

## 2018-10-13 DIAGNOSIS — Z91040 Latex allergy status: Secondary | ICD-10-CM

## 2018-10-13 DIAGNOSIS — R079 Chest pain, unspecified: Secondary | ICD-10-CM

## 2018-10-13 DIAGNOSIS — Z8249 Family history of ischemic heart disease and other diseases of the circulatory system: Secondary | ICD-10-CM

## 2018-10-13 DIAGNOSIS — F1410 Cocaine abuse, uncomplicated: Secondary | ICD-10-CM | POA: Diagnosis present

## 2018-10-13 DIAGNOSIS — E876 Hypokalemia: Secondary | ICD-10-CM | POA: Diagnosis present

## 2018-10-13 DIAGNOSIS — D62 Acute posthemorrhagic anemia: Secondary | ICD-10-CM | POA: Diagnosis present

## 2018-10-13 DIAGNOSIS — Z6820 Body mass index (BMI) 20.0-20.9, adult: Secondary | ICD-10-CM

## 2018-10-13 DIAGNOSIS — Z91048 Other nonmedicinal substance allergy status: Secondary | ICD-10-CM

## 2018-10-13 DIAGNOSIS — F1010 Alcohol abuse, uncomplicated: Secondary | ICD-10-CM | POA: Diagnosis present

## 2018-10-13 DIAGNOSIS — Z888 Allergy status to other drugs, medicaments and biological substances status: Secondary | ICD-10-CM

## 2018-10-13 DIAGNOSIS — F17210 Nicotine dependence, cigarettes, uncomplicated: Secondary | ICD-10-CM | POA: Diagnosis present

## 2018-10-13 DIAGNOSIS — I456 Pre-excitation syndrome: Secondary | ICD-10-CM | POA: Diagnosis present

## 2018-10-13 DIAGNOSIS — M545 Low back pain: Secondary | ICD-10-CM | POA: Diagnosis present

## 2018-10-13 DIAGNOSIS — F1910 Other psychoactive substance abuse, uncomplicated: Secondary | ICD-10-CM | POA: Diagnosis present

## 2018-10-13 DIAGNOSIS — K219 Gastro-esophageal reflux disease without esophagitis: Secondary | ICD-10-CM | POA: Diagnosis present

## 2018-10-13 DIAGNOSIS — F319 Bipolar disorder, unspecified: Secondary | ICD-10-CM | POA: Diagnosis present

## 2018-10-13 DIAGNOSIS — Z808 Family history of malignant neoplasm of other organs or systems: Secondary | ICD-10-CM

## 2018-10-13 DIAGNOSIS — E860 Dehydration: Secondary | ICD-10-CM | POA: Diagnosis present

## 2018-10-13 DIAGNOSIS — Z91041 Radiographic dye allergy status: Secondary | ICD-10-CM

## 2018-10-13 DIAGNOSIS — K922 Gastrointestinal hemorrhage, unspecified: Secondary | ICD-10-CM

## 2018-10-13 DIAGNOSIS — M19021 Primary osteoarthritis, right elbow: Secondary | ICD-10-CM | POA: Diagnosis present

## 2018-10-13 DIAGNOSIS — M170 Bilateral primary osteoarthritis of knee: Secondary | ICD-10-CM | POA: Diagnosis present

## 2018-10-13 DIAGNOSIS — F4310 Post-traumatic stress disorder, unspecified: Secondary | ICD-10-CM | POA: Diagnosis present

## 2018-10-13 DIAGNOSIS — F1210 Cannabis abuse, uncomplicated: Secondary | ICD-10-CM | POA: Diagnosis present

## 2018-10-13 DIAGNOSIS — I10 Essential (primary) hypertension: Secondary | ICD-10-CM | POA: Diagnosis present

## 2018-10-13 DIAGNOSIS — Z915 Personal history of self-harm: Secondary | ICD-10-CM

## 2018-10-13 DIAGNOSIS — K254 Chronic or unspecified gastric ulcer with hemorrhage: Principal | ICD-10-CM | POA: Diagnosis present

## 2018-10-13 DIAGNOSIS — J45909 Unspecified asthma, uncomplicated: Secondary | ICD-10-CM | POA: Diagnosis present

## 2018-10-13 DIAGNOSIS — Z811 Family history of alcohol abuse and dependence: Secondary | ICD-10-CM

## 2018-10-13 DIAGNOSIS — E785 Hyperlipidemia, unspecified: Secondary | ICD-10-CM | POA: Diagnosis present

## 2018-10-13 DIAGNOSIS — G8929 Other chronic pain: Secondary | ICD-10-CM | POA: Diagnosis present

## 2018-10-13 DIAGNOSIS — K920 Hematemesis: Secondary | ICD-10-CM | POA: Diagnosis present

## 2018-10-13 DIAGNOSIS — M19022 Primary osteoarthritis, left elbow: Secondary | ICD-10-CM | POA: Diagnosis present

## 2018-10-13 DIAGNOSIS — Z79899 Other long term (current) drug therapy: Secondary | ICD-10-CM

## 2018-10-13 DIAGNOSIS — K852 Alcohol induced acute pancreatitis without necrosis or infection: Secondary | ICD-10-CM

## 2018-10-13 DIAGNOSIS — F17290 Nicotine dependence, other tobacco product, uncomplicated: Secondary | ICD-10-CM | POA: Diagnosis present

## 2018-10-13 DIAGNOSIS — K861 Other chronic pancreatitis: Secondary | ICD-10-CM | POA: Diagnosis present

## 2018-10-13 DIAGNOSIS — D573 Sickle-cell trait: Secondary | ICD-10-CM | POA: Diagnosis present

## 2018-10-13 DIAGNOSIS — Z91013 Allergy to seafood: Secondary | ICD-10-CM

## 2018-10-13 LAB — TYPE AND SCREEN
ABO/RH(D): B POS
Antibody Screen: NEGATIVE

## 2018-10-13 LAB — CBC WITH DIFFERENTIAL/PLATELET
Abs Immature Granulocytes: 0.02 10*3/uL (ref 0.00–0.07)
Basophils Absolute: 0 10*3/uL (ref 0.0–0.1)
Basophils Relative: 0 %
Eosinophils Absolute: 0.3 10*3/uL (ref 0.0–0.5)
Eosinophils Relative: 3 %
HCT: 33.1 % — ABNORMAL LOW (ref 39.0–52.0)
Hemoglobin: 11.6 g/dL — ABNORMAL LOW (ref 13.0–17.0)
Immature Granulocytes: 0 %
Lymphocytes Relative: 22 %
Lymphs Abs: 1.9 10*3/uL (ref 0.7–4.0)
MCH: 32.8 pg (ref 26.0–34.0)
MCHC: 35 g/dL (ref 30.0–36.0)
MCV: 93.5 fL (ref 80.0–100.0)
Monocytes Absolute: 1 10*3/uL (ref 0.1–1.0)
Monocytes Relative: 12 %
Neutro Abs: 5.4 10*3/uL (ref 1.7–7.7)
Neutrophils Relative %: 63 %
Platelets: 227 10*3/uL (ref 150–400)
RBC: 3.54 MIL/uL — ABNORMAL LOW (ref 4.22–5.81)
RDW: 13.3 % (ref 11.5–15.5)
WBC: 8.6 10*3/uL (ref 4.0–10.5)
nRBC: 0 % (ref 0.0–0.2)

## 2018-10-13 LAB — COMPREHENSIVE METABOLIC PANEL
ALT: 39 U/L (ref 0–44)
AST: 42 U/L — ABNORMAL HIGH (ref 15–41)
Albumin: 3.2 g/dL — ABNORMAL LOW (ref 3.5–5.0)
Alkaline Phosphatase: 71 U/L (ref 38–126)
Anion gap: 11 (ref 5–15)
BUN: 19 mg/dL (ref 6–20)
CO2: 19 mmol/L — ABNORMAL LOW (ref 22–32)
Calcium: 6.7 mg/dL — ABNORMAL LOW (ref 8.9–10.3)
Chloride: 107 mmol/L (ref 98–111)
Creatinine, Ser: 1.49 mg/dL — ABNORMAL HIGH (ref 0.61–1.24)
GFR calc Af Amer: >60 mL/min (ref >60)
GFR calc non Af Amer: 54 mL/min — ABNORMAL LOW (ref >60)
Glucose, Bld: 79 mg/dL (ref 70–99)
Potassium: 3 mmol/L — ABNORMAL LOW (ref 3.5–5.1)
Sodium: 137 mmol/L (ref 135–145)
Total Bilirubin: 0.8 mg/dL (ref 0.3–1.2)
Total Protein: 5.7 g/dL — ABNORMAL LOW (ref 6.5–8.1)

## 2018-10-13 LAB — LACTIC ACID, PLASMA: Lactic Acid, Venous: 1.3 mmol/L (ref 0.5–1.9)

## 2018-10-13 LAB — TROPONIN I: Troponin I: <0.03 ng/mL (ref <0.03)

## 2018-10-13 LAB — LIPASE, BLOOD: Lipase: 26 U/L (ref 11–51)

## 2018-10-13 MED ORDER — MORPHINE SULFATE (PF) 4 MG/ML IV SOLN
4.0000 mg | Freq: Once | INTRAVENOUS | Status: AC
  Administered 2018-10-14: 4 mg via INTRAVENOUS
  Filled 2018-10-13: qty 1

## 2018-10-13 MED ORDER — ALUM & MAG HYDROXIDE-SIMETH 200-200-20 MG/5ML PO SUSP
30.0000 mL | Freq: Once | ORAL | Status: AC
  Administered 2018-10-13: 30 mL via ORAL
  Filled 2018-10-13: qty 30

## 2018-10-13 MED ORDER — FAMOTIDINE IN NACL 20-0.9 MG/50ML-% IV SOLN
20.0000 mg | INTRAVENOUS | Status: AC
  Administered 2018-10-13: 20 mg via INTRAVENOUS
  Filled 2018-10-13: qty 50

## 2018-10-13 MED ORDER — ONDANSETRON HCL 4 MG/2ML IJ SOLN
4.0000 mg | Freq: Once | INTRAMUSCULAR | Status: AC
  Administered 2018-10-13: 4 mg via INTRAVENOUS
  Filled 2018-10-13: qty 2

## 2018-10-13 MED ORDER — SODIUM CHLORIDE 0.9 % IV BOLUS
1000.0000 mL | Freq: Once | INTRAVENOUS | Status: AC
  Administered 2018-10-13: 1000 mL via INTRAVENOUS

## 2018-10-13 NOTE — ED Triage Notes (Signed)
Pt brought in by EMS with c/o nausea and vomiting with dark red blood x1 hour.  Pt states 3 episodes of vomiting.  C/O chest pain post vomiting

## 2018-10-13 NOTE — ED Provider Notes (Signed)
[facility] EMERGENCY DEPARTMENT Provider Note   CSN: [id number] Arrival date & time: 10/13/18  1920    History   Chief Complaint Chief Complaint  Patient presents with  . Chest Pain  . Emesis    HPI [name] is a 48 y.o. male.     48yo M w/ PMH including bipolar d/o, alcohol abuse, chronic pancreatitis, chronic abd pain w/ frequent ED visits, HTN, WPW, HLD, who p/w hematemesis.  Earlier today, he began having vomiting that was initially bright red blood and now has turned dark red and coffee grounds in appearance. He reports associated upper abdominal pain, dizziness/lightheadedness, diaphoresis, mild SOB. No fever or cough. He has continued to drink alcohol daily including today. No urinary symptoms.  He is compliant with his medications including PPI, H2 blocker, and Carafate.  Last dose was this morning.  The history is provided by the patient.  Chest Pain  Associated symptoms: vomiting   Emesis    Past Medical History:  Diagnosis Date  . Alcoholism /alcohol abuse ([place])   . Anemia   . Anxiety   . Arthritis    "knees; arms; elbows" (03/26/2015)  . Asthma   . Bipolar disorder ([place])   . Chronic bronchitis ([place])   . Chronic lower back pain   . Chronic pancreatitis ([place])   . Cocaine abuse ([place])   . Depression   . Family history of adverse reaction to anesthesia    "grandmother gets confused"  . Femoral condyle fracture ([place]) 03/08/2014   left medial/notes 03/09/2014  . GERD (gastroesophageal reflux disease)   . H/O hiatal hernia   . H/O suicide attempt 10/2012  . Heart murmur    "when he was little" (03/06/2013)  . High cholesterol   . History of blood transfusion 10/2012   "when I tried to commit suicide"  . History of stomach ulcers   . Hypertension   . Marijuana abuse, continuous   . Migraine    "a few times/year" (03/26/2015)  . Pneumonia 1990's X 3  . PTSD (post-traumatic stress disorder)   . Shortness of breath    "can  happen at anytime" (03/06/2013)  . Sickle cell trait ([place])   . WPW (Wolff-Parkinson-White syndrome)    [name] 03/06/2013    Patient Active Problem List   Diagnosis Date Noted  . Gastritis and gastroduodenitis   . Chest pain 01/08/2018  . GI bleed 11/24/2017  . Acute blood loss anemia 11/24/2017  . Atypical chest pain 11/24/2017  . Acute pancreatitis 09/28/2017  . Abdominal pain 05/27/2017  . Hematemesis 05/27/2017  . Tachycardia 03/18/2017  . Diarrhea 03/18/2017  . Acute on chronic pancreatitis ([place]) 12/17/2016  . Intractable nausea and vomiting 12/05/2016  . Verbally abusive behavior 12/05/2016  . Normocytic anemia 12/05/2016  . Alcohol use disorder, severe, dependence ([place]) 07/25/2016  . Cocaine use disorder, severe, dependence ([place]) 07/25/2016  . Major depressive disorder, recurrent severe without psychotic features ([place]) 07/20/2016  . Leukocytosis   . Hospital acquired PNA 05/20/2015  . Chronic pancreatitis ([place]) 05/18/2015  . Pseudocyst of pancreas 05/18/2015  . Polysubstance abuse (tobacco, cocaine, THC, and ETOH) 03/26/2015  . Alcohol-induced chronic pancreatitis ([place])   . Benign essential HTN 02/06/2014  . Alcohol-induced acute pancreatitis 11/28/2013  . Pancreatic pseudocyst/cyst 11/25/2013  . Severe protein-calorie malnutrition ([place]) 10/10/2013  . Suicide attempt ([place]) 10/08/2013  . 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 TOBACCO ABUSE 03/23/2007    Past Surgical History:  Procedure  Laterality Date  . BIOPSY  11/25/2017   Procedure: BIOPSY;  Surgeon: Arta Silence, MD;  Location: [facility];  Service: Endoscopy;;  . CARDIAC CATHETERIZATION    . ESOPHAGOGASTRODUODENOSCOPY (EGD) WITH PROPOFOL N/A 11/25/2017   Procedure: ESOPHAGOGASTRODUODENOSCOPY (EGD) WITH PROPOFOL;  Surgeon: Arta Silence, MD;  Location: [facility];  Service: Endoscopy;  Laterality: N/A;  . EYE SURGERY Left 1990's   "result of trauma"   . FACIAL FRACTURE  SURGERY Left 1990's   "result of trauma"   . FRACTURE SURGERY    . HERNIA REPAIR    . LEFT HEART CATHETERIZATION WITH CORONARY ANGIOGRAM Right 03/07/2013   Procedure: LEFT HEART CATHETERIZATION WITH CORONARY ANGIOGRAM;  Surgeon: [name], MD;  Location: [facility];  Service: Cardiovascular;  Laterality: Right;  . UMBILICAL HERNIA REPAIR          Home Medications    Prior to Admission medications   Medication Sig Start Date End Date Taking? Authorizing Provider  acetaminophen (TYLENOL) 500 MG tablet Take 1,000 mg by mouth every 6 (six) hours as needed for mild pain.    [provider]  Albuterol Sulfate (PROAIR HFA IN) Inhale 2 puffs into the lungs every 6 (six) hours as needed (for shortness of breath).    [provider]  Cyanocobalamin (VITAMIN B-12 PO) Take 1 tablet by mouth daily.    [provider]  famotidine (PEPCID) 20 MG tablet Take 1 tablet (20 mg total) by mouth 2 (two) times daily. 04/06/18   Orlie Dakin, MD  folic acid (FOLVITE) 1 MG tablet Take 1 tablet (1 mg total) by mouth daily. 11/26/17   [name], MD  HYDRALAZINE HCL PO Take 1 tablet by mouth every 8 (eight) hours.    [provider]  lipase/protease/amylase (CREON) 12000 units CPEP capsule Take 1 capsule (12,000 Units total) by mouth 3 (three) times daily with meals. For pancreatitis 10/01/17   Aline August, MD  loratadine (CLARITIN) 10 MG tablet Take 1 tablet (10 mg total) by mouth daily as needed for allergies or rhinitis. (May purchase from over the counter): For allergies 10/01/17   Aline August, MD  metoprolol tartrate (LOPRESSOR) 25 MG tablet Take 1 tablet (25 mg total) by mouth 2 (two) times daily. For high blood pressure 08/29/17   Clent Demark, PA-C  Multiple Vitamin (MULTIVITAMIN WITH MINERALS) TABS tablet Take 1 tablet by mouth daily. 09/06/17   Bonnell Public, MD  omeprazole (PRILOSEC) 20 MG capsule Take 1 capsule (20 mg total) by mouth daily.  03/26/18   Noemi Chapel, MD  ondansetron (ZOFRAN) 4 MG tablet Take 1 tablet (4 mg total) by mouth every 8 (eight) hours as needed for nausea or vomiting. Patient not taking: Reported on 3/00/9233 0/07/62   Delora Fuel, MD  pantoprazole (PROTONIX) 40 MG tablet Take 1 tablet (40 mg total) by mouth 2 (two) times daily. 06/19/18   Malvin Johns, MD  promethazine (PHENERGAN) 25 MG tablet Take 25 mg by mouth daily as needed for nausea or vomiting.    [provider]  QUEtiapine (SEROQUEL) 100 MG tablet Take 100 mg by mouth at bedtime.    [provider]  sertraline (ZOLOFT) 100 MG tablet Take 1 tablet (100 mg total) by mouth daily. Patient taking differently: Take 150 mg by mouth daily.  08/29/17   Clent Demark, PA-C  sucralfate (CARAFATE) 1 g tablet Take 1 tablet (1 g total) by mouth 4 (four) times daily. 06/19/18   Malvin Johns, MD  Family History Family History  Problem Relation Age of Onset  . Hypertension Mother   . Cirrhosis Mother   . Alcoholism Mother   . Hypertension Father   . Melanoma Father   . Hypertension Other   . Coronary artery disease Other     Social History Social History   Tobacco Use  . Smoking status: Current Every Day Smoker    Packs/day: 1.00    Years: 33.00    Pack years: 33.00    Types: Cigarettes, E-cigarettes  . Smokeless tobacco: Never Used  Substance Use Topics  . Alcohol use: Yes  . Drug use: Yes    Types: Marijuana, Cocaine    Comment: daily marijuana use; last cocaine use about 3 months ago     Allergies   Robaxin [methocarbamol]; Shellfish-derived products; Trazodone; Trazodone and nefazodone; Adhesive [tape]; Latex; Toradol [ketorolac tromethamine]; Contrast media [iodinated diagnostic agents]; and Reglan [metoclopramide]   Review of Systems Review of Systems  Cardiovascular: Positive for chest pain.  Gastrointestinal: Positive for vomiting.   All other systems reviewed and are negative except that which was  mentioned in HPI   Physical Exam Updated Vital Signs BP (!) 152/109   Pulse 99   Temp 97.8 F (36.6 C) (Oral)   Resp 13   Ht 5\' 9"  (1.753 m)   Wt 63.5 kg   SpO2 98%   BMI 20.67 kg/m   Physical Exam Vitals signs and nursing note reviewed.  Constitutional:      General: He is not in acute distress.    Appearance: He is well-developed.     Comments: Chronically ill-appearing, uncomfortable but no acute distress; holding emesis bag full of coffee-grounds emesis  HENT:     Head: Normocephalic and atraumatic.  Eyes:     Conjunctiva/sclera: Conjunctivae normal.  Neck:     Musculoskeletal: Neck supple.  Cardiovascular:     Rate and Rhythm: Normal rate and regular rhythm.     Heart sounds: Normal heart sounds. No murmur.  Pulmonary:     Effort: Pulmonary effort is normal.     Breath sounds: Normal breath sounds.  Abdominal:     General: Bowel sounds are normal. There is no distension.     Palpations: Abdomen is soft.     Tenderness: There is abdominal tenderness.     Comments: Mild epigastric and LUQ TTP  Skin:    General: Skin is warm and dry.  Neurological:     Mental Status: He is alert and oriented to person, place, and time.     Comments: Fluent speech  Psychiatric:        Judgment: Judgment normal.      ED Treatments / Results  Labs (all labs ordered are listed, but only abnormal results are displayed) Labs Reviewed  COMPREHENSIVE METABOLIC PANEL - Abnormal; Notable for the following components:      Result Value   Potassium 3.0 (*)    CO2 19 (*)    Creatinine, Ser 1.49 (*)    Calcium 6.7 (*)    Total Protein 5.7 (*)    Albumin 3.2 (*)    AST 42 (*)    GFR calc non Af Amer 54 (*)    All other components within normal limits  CBC WITH DIFFERENTIAL/PLATELET - Abnormal; Notable for the following components:   RBC 3.54 (*)    Hemoglobin 11.6 (*)    HCT 33.1 (*)    All other components within normal limits  LIPASE, BLOOD  LACTIC ACID,  PLASMA  TROPONIN I   LACTIC ACID, PLASMA  TYPE AND SCREEN    EKG EKG Interpretation  Date/Time:  Saturday October 13 2018 19:24:12 EDT Ventricular Rate:  88 PR Interval:    QRS Duration: 87 QT Interval:  350 QTC Calculation: 424 R Axis:   76 Text Interpretation:  Sinus rhythm Short PR interval Biatrial enlargement Nonspecific T abnormalities, lateral leads previous diffuse T wave inversions have resolved Confirmed by Theotis Burrow [phone]) on 10/13/2018 7:31:50 PM   Radiology No results found.  Procedures Procedures (including critical care time)  Medications Ordered in ED Medications  famotidine (PEPCID) IVPB 20 mg premix (0 mg Intravenous Stopped 10/13/18 2158)  alum & mag hydroxide-simeth (MAALOX/MYLANTA) 200-200-20 MG/5ML suspension 30 mL (30 mLs Oral Given 10/13/18 2035)  sodium chloride 0.9 % bolus 1,000 mL (1,000 mLs Intravenous New Bag/Given 10/13/18 2340)  ondansetron (ZOFRAN) injection 4 mg (4 mg Intravenous Given 10/13/18 2340)  morphine 4 MG/ML injection 4 mg (4 mg Intravenous Given 10/14/18 0002)     Initial Impression / Assessment and Plan / ED Course  I have reviewed the triage vital signs and the nursing notes.  Pertinent labs & imaging results that were available during my care of the patient were reviewed by me and considered in my medical decision making (see chart for details).        He was nontoxic on exam, stable vital signs, was holding emesis bag with large amount of dark bloody emesis.  Given his history of PUD, suspect this may be causing UGIB. DDx includes Mack Guise tear. No h/o cirrhosis to suggest esophageal varices. Gave IV pepcid.  His lab work shows mild AKI likely secondary to dehydration, hemoglobin 11.  I am concerned about the quantity of hematemesis that he has had including the emesis bag that I personally witnessed.  I recommended admission for further work-up and treatment.  Discussed with [facility], Dr. Paulita Fujita, who agreed that they will see the patient in  consultation.  I have contacted [facility] for admission and patient will be admitted for further care. Final Clinical Impressions(s) / ED Diagnoses   Final diagnoses:  None    ED Discharge Orders    None       Little, Wenda Overland, MD 10/14/18 [phone]

## 2018-10-14 ENCOUNTER — Inpatient Hospital Stay (HOSPITAL_COMMUNITY): Payer: Self-pay | Admitting: Certified Registered Nurse Anesthetist

## 2018-10-14 ENCOUNTER — Inpatient Hospital Stay (HOSPITAL_COMMUNITY): Payer: Self-pay

## 2018-10-14 ENCOUNTER — Encounter (HOSPITAL_COMMUNITY)
Admission: EM | Disposition: A | Discharge status: Home / Self Care | Payer: Self-pay | Source: Home / Self Care | Attending: Internal Medicine

## 2018-10-14 ENCOUNTER — Encounter (HOSPITAL_COMMUNITY): Payer: Self-pay | Admitting: *Deleted

## 2018-10-14 DIAGNOSIS — K920 Hematemesis: Secondary | ICD-10-CM

## 2018-10-14 DIAGNOSIS — I10 Essential (primary) hypertension: Secondary | ICD-10-CM

## 2018-10-14 DIAGNOSIS — F1910 Other psychoactive substance abuse, uncomplicated: Secondary | ICD-10-CM

## 2018-10-14 DIAGNOSIS — D62 Acute posthemorrhagic anemia: Secondary | ICD-10-CM

## 2018-10-14 HISTORY — PX: ESOPHAGOGASTRODUODENOSCOPY (EGD) WITH PROPOFOL: SHX5813

## 2018-10-14 HISTORY — PX: BIOPSY: SHX5522

## 2018-10-14 LAB — COMPREHENSIVE METABOLIC PANEL
ALT: 50 U/L — ABNORMAL HIGH (ref 0–44)
AST: 48 U/L — ABNORMAL HIGH (ref 15–41)
Albumin: 4.1 g/dL (ref 3.5–5.0)
Alkaline Phosphatase: 103 U/L (ref 38–126)
Anion gap: 16 — ABNORMAL HIGH (ref 5–15)
BUN: 19 mg/dL (ref 6–20)
CO2: 19 mmol/L — ABNORMAL LOW (ref 22–32)
Calcium: 8.6 mg/dL — ABNORMAL LOW (ref 8.9–10.3)
Chloride: 102 mmol/L (ref 98–111)
Creatinine, Ser: 1.43 mg/dL — ABNORMAL HIGH (ref 0.61–1.24)
GFR calc Af Amer: >60 mL/min (ref >60)
GFR calc non Af Amer: 57 mL/min — ABNORMAL LOW (ref >60)
Glucose, Bld: 81 mg/dL (ref 70–99)
Potassium: 3.8 mmol/L (ref 3.5–5.1)
Sodium: 137 mmol/L (ref 135–145)
Total Bilirubin: 1.4 mg/dL — ABNORMAL HIGH (ref 0.3–1.2)
Total Protein: 7.1 g/dL (ref 6.5–8.1)

## 2018-10-14 LAB — CBC
HCT: 33.3 % — ABNORMAL LOW (ref 39.0–52.0)
HCT: 33.4 % — ABNORMAL LOW (ref 39.0–52.0)
Hemoglobin: 11.9 g/dL — ABNORMAL LOW (ref 13.0–17.0)
Hemoglobin: 11.6 g/dL — ABNORMAL LOW (ref 13.0–17.0)
MCH: 33.3 pg (ref 26.0–34.0)
MCH: 32.8 pg (ref 26.0–34.0)
MCHC: 35.7 g/dL (ref 30.0–36.0)
MCHC: 34.7 g/dL (ref 30.0–36.0)
MCV: 93.3 fL (ref 80.0–100.0)
MCV: 94.4 fL (ref 80.0–100.0)
Platelets: 228 10*3/uL (ref 150–400)
Platelets: 223 10*3/uL (ref 150–400)
RBC: 3.57 MIL/uL — ABNORMAL LOW (ref 4.22–5.81)
RBC: 3.54 MIL/uL — ABNORMAL LOW (ref 4.22–5.81)
RDW: 13.4 % (ref 11.5–15.5)
RDW: 13.5 % (ref 11.5–15.5)
WBC: 9.6 10*3/uL (ref 4.0–10.5)
WBC: 7.6 10*3/uL (ref 4.0–10.5)
nRBC: 0 % (ref 0.0–0.2)
nRBC: 0 % (ref 0.0–0.2)

## 2018-10-14 LAB — RAPID URINE DRUG SCREEN, HOSP PERFORMED
Amphetamines: NOT DETECTED
Barbiturates: NOT DETECTED
Benzodiazepines: NOT DETECTED
Cocaine: POSITIVE — AB
Opiates: POSITIVE — AB
Tetrahydrocannabinol: NOT DETECTED

## 2018-10-14 LAB — LACTIC ACID, PLASMA: Lactic Acid, Venous: 1.2 mmol/L (ref 0.5–1.9)

## 2018-10-14 LAB — SARS CORONAVIRUS 2 BY RT PCR (HOSPITAL ORDER, PERFORMED IN ~~LOC~~ HOSPITAL LAB): SARS Coronavirus 2: NEGATIVE

## 2018-10-14 LAB — HIV ANTIBODY (ROUTINE TESTING W REFLEX): HIV Screen 4th Generation wRfx: NONREACTIVE

## 2018-10-14 SURGERY — ESOPHAGOGASTRODUODENOSCOPY (EGD) WITH PROPOFOL
Anesthesia: Monitor Anesthesia Care | Laterality: Left

## 2018-10-14 MED ORDER — PANTOPRAZOLE SODIUM 40 MG IV SOLR: 40.0000 mg | Freq: Two times a day (BID) | INTRAVENOUS | Status: DC

## 2018-10-14 MED ORDER — VITAMIN B-1 100 MG PO TABS
100.0000 mg | ORAL_TABLET | Freq: Every day | ORAL | Status: DC
  Administered 2018-10-14: 100 mg via ORAL
  Filled 2018-10-14 (×2): qty 1

## 2018-10-14 MED ORDER — LACTATED RINGERS IV SOLN: INTRAVENOUS | Status: DC

## 2018-10-14 MED ORDER — FOLIC ACID 1 MG PO TABS
1.0000 mg | ORAL_TABLET | Freq: Every day | ORAL | Status: DC
  Administered 2018-10-14 – 2018-10-15 (×2): 1 mg via ORAL
  Filled 2018-10-14 (×2): qty 1

## 2018-10-14 MED ORDER — SODIUM CHLORIDE 0.9 % IV SOLN
80.0000 mg | Freq: Once | INTRAVENOUS | Status: DC
  Filled 2018-10-14: qty 80

## 2018-10-14 MED ORDER — LORAZEPAM 2 MG/ML IJ SOLN: 0.0000 mg | Freq: Two times a day (BID) | INTRAMUSCULAR | Status: DC

## 2018-10-14 MED ORDER — LORAZEPAM 1 MG PO TABS
1.0000 mg | ORAL_TABLET | Freq: Four times a day (QID) | ORAL | Status: DC | PRN
  Administered 2018-10-15: 1 mg via ORAL
  Filled 2018-10-14 (×2): qty 1

## 2018-10-14 MED ORDER — SODIUM CHLORIDE 0.9 % IV SOLN
8.0000 mg/h | INTRAVENOUS | Status: DC
  Administered 2018-10-14: 8 mg/h via INTRAVENOUS
  Filled 2018-10-14: qty 80

## 2018-10-14 MED ORDER — SODIUM CHLORIDE 0.9 % IV SOLN
INTRAVENOUS | Status: DC
  Administered 2018-10-14: 03:00:00 via INTRAVENOUS

## 2018-10-14 MED ORDER — OXYCODONE HCL 5 MG PO TABS
5.0000 mg | ORAL_TABLET | Freq: Four times a day (QID) | ORAL | Status: DC | PRN
  Administered 2018-10-14 – 2018-10-15 (×3): 5 mg via ORAL
  Filled 2018-10-14 (×3): qty 1

## 2018-10-14 MED ORDER — MORPHINE SULFATE (PF) 2 MG/ML IV SOLN
2.0000 mg | INTRAVENOUS | Status: AC | PRN
  Administered 2018-10-14 (×3): 4 mg via INTRAVENOUS
  Filled 2018-10-14 (×3): qty 2

## 2018-10-14 MED ORDER — NICOTINE 21 MG/24HR TD PT24
21.0000 mg | MEDICATED_PATCH | Freq: Every day | TRANSDERMAL | Status: DC
  Administered 2018-10-14 – 2018-10-15 (×2): 21 mg via TRANSDERMAL
  Filled 2018-10-14 (×2): qty 1

## 2018-10-14 MED ORDER — SUCRALFATE 1 GM/10ML PO SUSP
1.0000 g | Freq: Three times a day (TID) | ORAL | Status: DC
  Administered 2018-10-14 – 2018-10-15 (×3): 1 g via ORAL
  Filled 2018-10-14 (×5): qty 10

## 2018-10-14 MED ORDER — ADULT MULTIVITAMIN W/MINERALS CH
1.0000 | ORAL_TABLET | Freq: Every day | ORAL | Status: DC
  Administered 2018-10-14 – 2018-10-15 (×2): 1 via ORAL
  Filled 2018-10-14 (×2): qty 1

## 2018-10-14 MED ORDER — MORPHINE SULFATE (PF) 2 MG/ML IV SOLN
2.0000 mg | Freq: Once | INTRAVENOUS | Status: AC
  Administered 2018-10-14: 2 mg via INTRAVENOUS
  Filled 2018-10-14: qty 1

## 2018-10-14 MED ORDER — ONDANSETRON HCL 4 MG/2ML IJ SOLN
4.0000 mg | Freq: Four times a day (QID) | INTRAMUSCULAR | Status: DC | PRN
  Filled 2018-10-14: qty 2

## 2018-10-14 MED ORDER — LACTATED RINGERS IV SOLN
INTRAVENOUS | Status: DC
  Administered 2018-10-14 – 2018-10-15 (×3): via INTRAVENOUS

## 2018-10-14 MED ORDER — PANTOPRAZOLE SODIUM 40 MG IV SOLR
40.0000 mg | Freq: Two times a day (BID) | INTRAVENOUS | Status: DC
  Filled 2018-10-14: qty 40

## 2018-10-14 MED ORDER — ONDANSETRON HCL 4 MG PO TABS: 4.0000 mg | ORAL_TABLET | Freq: Four times a day (QID) | ORAL | Status: DC | PRN

## 2018-10-14 MED ORDER — THIAMINE HCL 100 MG/ML IJ SOLN
100.0000 mg | Freq: Every day | INTRAMUSCULAR | Status: DC
  Administered 2018-10-15: 100 mg via INTRAVENOUS
  Filled 2018-10-14: qty 2

## 2018-10-14 MED ORDER — TRAMADOL HCL 50 MG PO TABS
50.0000 mg | ORAL_TABLET | Freq: Four times a day (QID) | ORAL | Status: DC | PRN
  Administered 2018-10-14 (×2): 50 mg via ORAL
  Filled 2018-10-14 (×2): qty 1

## 2018-10-14 MED ORDER — PROPOFOL 10 MG/ML IV BOLUS
INTRAVENOUS | Status: DC | PRN
  Administered 2018-10-14 (×4): 20 mg via INTRAVENOUS

## 2018-10-14 MED ORDER — LACTATED RINGERS IV SOLN
INTRAVENOUS | Status: DC
  Administered 2018-10-14: 1000 mL via INTRAVENOUS
  Administered 2018-10-14: 11:00:00 via INTRAVENOUS

## 2018-10-14 MED ORDER — SODIUM CHLORIDE 0.9 % IV SOLN: 8.0000 mg/h | INTRAVENOUS | Status: DC

## 2018-10-14 MED ORDER — PANTOPRAZOLE SODIUM 40 MG PO TBEC
40.0000 mg | DELAYED_RELEASE_TABLET | Freq: Two times a day (BID) | ORAL | Status: DC
  Administered 2018-10-14 – 2018-10-15 (×2): 40 mg via ORAL
  Filled 2018-10-14 (×3): qty 1

## 2018-10-14 MED ORDER — LORAZEPAM 2 MG/ML IJ SOLN
1.0000 mg | Freq: Four times a day (QID) | INTRAMUSCULAR | Status: DC | PRN
  Administered 2018-10-14 – 2018-10-15 (×2): 1 mg via INTRAVENOUS

## 2018-10-14 MED ORDER — LORAZEPAM 2 MG/ML IJ SOLN
0.0000 mg | Freq: Four times a day (QID) | INTRAMUSCULAR | Status: DC
  Administered 2018-10-15: 2 mg via INTRAVENOUS
  Filled 2018-10-14 (×3): qty 1

## 2018-10-14 MED ORDER — PROPOFOL 500 MG/50ML IV EMUL
INTRAVENOUS | Status: DC | PRN
  Administered 2018-10-14: 100 ug/kg/min via INTRAVENOUS

## 2018-10-14 MED ORDER — SODIUM CHLORIDE 0.9 % IV SOLN
80.0000 mg | Freq: Once | INTRAVENOUS | Status: AC
  Administered 2018-10-14: 80 mg via INTRAVENOUS
  Filled 2018-10-14: qty 80

## 2018-10-14 SURGICAL SUPPLY — 14 items
BLOCK BITE 60FR ADLT L/F BLUE (MISCELLANEOUS) ×4 IMPLANT
ELECT REM PT RETURN 9FT ADLT (ELECTROSURGICAL)
ELECTRODE REM PT RTRN 9FT ADLT (ELECTROSURGICAL) IMPLANT
FORCEP RJ3 GP 1.8X160 W-NEEDLE (CUTTING FORCEPS) IMPLANT
FORCEPS BIOP RAD 4 LRG CAP 4 (CUTTING FORCEPS) IMPLANT
NEEDLE SCLEROTHERAPY 25GX240 (NEEDLE) IMPLANT
PROBE APC STR FIRE (PROBE) IMPLANT
PROBE INJECTION GOLD (MISCELLANEOUS)
PROBE INJECTION GOLD 7FR (MISCELLANEOUS) IMPLANT
SNARE SHORT THROW 13M SML OVAL (MISCELLANEOUS) IMPLANT
SYR 50ML LL SCALE MARK (SYRINGE) IMPLANT
TUBING ENDO SMARTCAP PENTAX (MISCELLANEOUS) ×8 IMPLANT
TUBING IRRIGATION ENDOGATOR (MISCELLANEOUS) ×4 IMPLANT
WATER STERILE IRR 1000ML POUR (IV SOLUTION) IMPLANT

## 2018-10-14 NOTE — Progress Notes (Signed)
[facility] PLAN OF CARE NOTE Patient: Clyde Zarrella BRA:[id number]   PCP: Marliss Coots, NP DOB: Jun 22, 1970   DOA: 10/13/2018   DOS: 10/14/2018    Patient was admitted by my colleague Dr. Jonelle Sidle earlier on 10/14/2018. I have reviewed the H&P as well as assessment and plan and agree with the same. Important changes in the plan are listed below.  Plan of care: Principal Problem:   Hematemesis Active Problems:   Severe protein-calorie malnutrition (HCC)   Benign essential HTN   Polysubstance abuse (tobacco, cocaine, THC, and ETOH)   Major depressive disorder, recurrent severe without psychotic features ([place])   Acute blood loss anemia  Abdominal pain. CT abdomen performed.  That shows chronic pancreatitis. Lipase level normal therefore I do not think the patient actually has an acute pancreatitis. S/P EGD no significant active bleeding.  Old ulcers identified. Gastrin test tomorrow.  Possible left lower lobe aspiration pneumonitis No evidence of pneumonia for now. Monitor for now.    Author: Berle Mull, MD [facility] 10/14/2018 8:15 PM   If 7PM-7AM, please contact night-coverage at [email or web address]

## 2018-10-14 NOTE — Transfer of Care (Signed)
Immediate Anesthesia Transfer of Care Note  Patient: [name]  Procedure(s) Performed: ESOPHAGOGASTRODUODENOSCOPY (EGD) WITH PROPOFOL (Left )  Patient Location: Endoscopy Unit  Anesthesia Type:MAC  Level of Consciousness: awake, alert  and oriented  Airway & Oxygen Therapy: Patient Spontanous Breathing and Patient connected to nasal cannula oxygen  Post-op Assessment: Report given to RN and Post -op Vital signs reviewed and stable  Post vital signs: Reviewed and stable  Last Vitals:  Vitals Value Taken Time  BP 129/88 10/14/2018 11:10 AM  Temp 36.5 C 10/14/2018 11:10 AM  Pulse 96 10/14/2018 11:10 AM  Resp 18 10/14/2018 11:10 AM  SpO2 100 % 10/14/2018 11:10 AM  Vitals shown include unvalidated device data.  Last Pain:  Vitals:   10/14/18 1110  TempSrc: Other (Comment)  PainSc: 0-No pain      Patients Stated Pain Goal: 3 (32/12/24 8250)  Complications: No apparent anesthesia complications

## 2018-10-14 NOTE — Anesthesia Preprocedure Evaluation (Signed)
Anesthesia Evaluation  Patient identified by MRN, date of birth, ID band Patient awake    Reviewed: Allergy & Precautions, NPO status , Patient's Chart, lab work & pertinent test results  Airway Mallampati: I  TM Distance: >3 FB Neck ROM: Full    Dental   Pulmonary Current Smoker,    Pulmonary exam normal        Cardiovascular hypertension, Pt. on medications Normal cardiovascular exam     Neuro/Psych Anxiety Depression Bipolar Disorder    GI/Hepatic GERD  Medicated and Controlled,  Endo/Other    Renal/GU      Musculoskeletal   Abdominal   Peds  Hematology   Anesthesia Other Findings   Reproductive/Obstetrics                             Anesthesia Physical Anesthesia Plan  ASA: II  Anesthesia Plan: General   Post-op Pain Management:    Induction: Intravenous  PONV Risk Score and Plan: 1 and Treatment may vary due to age or medical condition  Airway Management Planned: Simple Face Mask  Additional Equipment:   Intra-op Plan:   Post-operative Plan:   Informed Consent: I have reviewed the patients History and Physical, chart, labs and discussed the procedure including the risks, benefits and alternatives for the proposed anesthesia with the patient or authorized representative who has indicated his/her understanding and acceptance.       Plan Discussed with: CRNA and Surgeon  Anesthesia Plan Comments:         Anesthesia Quick Evaluation

## 2018-10-14 NOTE — Op Note (Signed)
[facility] Patient Name: [name] Procedure Date : 10/14/2018 MRN: [id number] Attending MD: Arta Silence , MD Date of Birth: October 22, 1970 CSN: [id number] Age: 48 Admit Type: Inpatient Procedure:                Upper GI endoscopy Indications:              Epigastric abdominal pain, Acute post hemorrhagic                            anemia, Hematemesis Providers:                Arta Silence, MD, Elmer Ramp. Tilden Dome, RN, Marguerita Merles, Technician, Laverda Sorenson, Technician,                            Clearnce Sorrel, CRNA Referring MD:             [facility] Medicines:                Monitored Anesthesia Care Complications:            No immediate complications. Estimated Blood Loss:     Estimated blood loss: none. Procedure:                Pre-Anesthesia Assessment:                           - Prior to the procedure, a History and Physical                            was performed, and patient medications and                            allergies were reviewed. The patient's tolerance of                            previous anesthesia was also reviewed. The risks                            and benefits of the procedure and the sedation                            options and risks were discussed with the patient.                            All questions were answered, and informed consent                            was obtained. Prior Anticoagulants: The patient has                            taken no previous anticoagulant or antiplatelet                            agents except  for NSAID medication. ASA Grade                            Assessment: II - A patient with mild systemic                            disease. After reviewing the risks and benefits,                            the patient was deemed in satisfactory condition to                            undergo the procedure.                           After obtaining informed  consent, the endoscope was                            passed under direct vision. Throughout the                            procedure, the patient's blood pressure, pulse, and                            oxygen saturations were monitored continuously. The                            GIF-H190 ([id number] Olympus gastroscope was                            introduced through the mouth, and advanced to the                            second part of duodenum. The upper GI endoscopy was                            accomplished without difficulty. The patient                            tolerated the procedure well. Scope In: Scope Out: Findings:      The examined esophagus was normal. No MW-Tear or mass or varices or       esophagitis noted.      One non-bleeding superficial gastric ulcer with no stigmata of bleeding       was found in the prepyloric region of the stomach. The lesion was 3 mm       in largest dimension.      One non-bleeding superficial gastric ulcer with no stigmata of bleeding       was found at the pylorus. The lesion was 5 mm in largest dimension.      The exam of the stomach was otherwise normal.      Patchy moderate mucosal changes characterized by ulceration were found       in the duodenal bulb, in the first portion of the duodenum and in the  second portion of the duodenum. Appearance similar to, but worse, than       that in July 2019. Biopsies were taken with a cold forceps for histology.      The exam of the duodenum was otherwise normal.      No old or fresh blood was seen to the extent of our examination. Impression:               - Normal esophagus.                           - Non-bleeding gastric ulcer with no stigmata of                            bleeding.                           - Non-bleeding gastric ulcer with no stigmata of                            bleeding. Prior gastric biopsies negative for H.                            pylori.                            - Mucosal changes in the duodenum. Biopsied. Moderate Sedation:      None Recommendation:           - Return patient to hospital ward for ongoing care.                           - Mechanical soft diet today.                           - Continue present medications.                           - Await pathology results.                           - Gastrin level.                           - No NSAIDs.                           - Pantoprazole 40 mg po bid now and upon discharge.                           - If can tolerate diet, can go home tomorrow from                            GI perspective.                           [facility] will sign-off; happy to see patient back  as outpatient in 4-6 weeks post-discharge; please                            call with any questions; thank you for the                            consultation. Procedure Code(s):        --- Professional ---                           [phone], Esophagogastroduodenoscopy, flexible,                            transoral; with biopsy, single or multiple Diagnosis Code(s):        --- Professional ---                           K25.9, Gastric ulcer, unspecified as acute or                            chronic, without hemorrhage or perforation                           K31.89, Other diseases of stomach and duodenum                           R10.13, Epigastric pain                           D62, Acute posthemorrhagic anemia                           K92.0, Hematemesis CPT copyright 2019 American Medical [employer]. All rights reserved. The codes documented in this report are preliminary and upon coder review may  be revised to meet current compliance requirements. Arta Silence, MD 10/14/2018 11:22:53 AM This report has been signed electronically. Number of Addenda: 0

## 2018-10-14 NOTE — Anesthesia Postprocedure Evaluation (Signed)
Anesthesia Post Note  Patient: [name]  Procedure(s) Performed: ESOPHAGOGASTRODUODENOSCOPY (EGD) WITH PROPOFOL (Left )     Patient location during evaluation: PACU Anesthesia Type: MAC Level of consciousness: awake and alert Pain management: pain level controlled Vital Signs Assessment: post-procedure vital signs reviewed and stable Respiratory status: spontaneous breathing, nonlabored ventilation, respiratory function stable and patient connected to nasal cannula oxygen Cardiovascular status: stable and blood pressure returned to baseline Postop Assessment: no apparent nausea or vomiting Anesthetic complications: no    Last Vitals:  Vitals:   10/14/18 1130 10/14/18 1159  BP: (!) 149/106 (!) 161/98  Pulse: 91 95  Resp: 12 16  Temp:  (!) 36.4 C  SpO2: 100% 100%    Last Pain:  Vitals:   10/14/18 1159  TempSrc: Oral  PainSc:                  Letoya Stallone DAVID

## 2018-10-14 NOTE — Consult Note (Signed)
[facility] Consultation Note  Referring Provider: Dr. Berle Mull [facility]) Primary Care Physician:  Synthia Innocent [name], NP  Reason for Consultation:  hematemesis  HPI: [name] is a 48 y.o. male presenting with epigastric pain and hematemesis.  Some black stools.  Taking NSAIDs.  Not eating much due to abdominal pain; has lost about 15 lbs over the past few weeks.  Has history of peptic ulcer, on endoscopy in July 2019, HP-negative.  Past Medical History:  Diagnosis Date  . Alcoholism /alcohol abuse ([place])   . Anemia   . Anxiety   . Arthritis    "knees; arms; elbows" (03/26/2015)  . Asthma   . Bipolar disorder ([place])   . Chronic bronchitis ([place])   . Chronic lower back pain   . Chronic pancreatitis ([place])   . Cocaine abuse ([place])   . Depression   . Family history of adverse reaction to anesthesia    "grandmother gets confused"  . Femoral condyle fracture ([place]) 03/08/2014   left medial/notes 03/09/2014  . GERD (gastroesophageal reflux disease)   . H/O hiatal hernia   . H/O suicide attempt 10/2012  . Heart murmur    "when he was little" (03/06/2013)  . High cholesterol   . History of blood transfusion 10/2012   "when I tried to commit suicide"  . History of stomach ulcers   . Hypertension   . Marijuana abuse, continuous   . Migraine    "a few times/year" (03/26/2015)  . Pneumonia 1990's X 3  . PTSD (post-traumatic stress disorder)   . Shortness of breath    "can happen at anytime" (03/06/2013)  . Sickle cell trait ([place])   . WPW (Wolff-Parkinson-White syndrome)    [name] 03/06/2013    Past Surgical History:  Procedure Laterality Date  . BIOPSY  11/25/2017   Procedure: BIOPSY;  Surgeon: Arta Silence, MD;  Location: [place];  Service: Endoscopy;;  . CARDIAC CATHETERIZATION    . ESOPHAGOGASTRODUODENOSCOPY (EGD) WITH PROPOFOL N/A 11/25/2017   Procedure: ESOPHAGOGASTRODUODENOSCOPY (EGD) WITH PROPOFOL;  Surgeon: Arta Silence, MD;  Location: [place];  Service: Endoscopy;  Laterality: N/A;  . EYE SURGERY Left 1990's   "result of trauma"   . FACIAL FRACTURE SURGERY Left 1990's   "result of trauma"   . FRACTURE SURGERY    . HERNIA REPAIR    . LEFT HEART CATHETERIZATION WITH CORONARY ANGIOGRAM Right 03/07/2013   Procedure: LEFT HEART CATHETERIZATION WITH CORONARY ANGIOGRAM;  Surgeon: [name], MD;  Location: [place] [facility];  Service: Cardiovascular;  Laterality: Right;  . UMBILICAL HERNIA REPAIR      Prior to Admission medications   Medication Sig Start Date End Date Taking? Authorizing Provider  acetaminophen (TYLENOL) 500 MG tablet Take 1,000 mg by mouth every 6 (six) hours as needed for mild pain.   Yes [provider]  Albuterol Sulfate (PROAIR HFA IN) Inhale 2 puffs into the lungs every 6 (six) hours as needed (for shortness of breath).   Yes [provider]  Cyanocobalamin (VITAMIN B-12 PO) Take 1 tablet by mouth daily.   Yes [provider]  famotidine (PEPCID) 20 MG tablet Take 1 tablet (20 mg total) by mouth 2 (two) times daily. 04/06/18  Yes Orlie Dakin, MD  folic acid (FOLVITE) 1 MG tablet Take 1 tablet (1 mg total) by mouth daily. 11/26/17  Yes [name], MD  HYDRALAZINE HCL PO Take 1 tablet by mouth every 8 (eight) hours.   Yes [provider]  hydrOXYzine (ATARAX/VISTARIL) 25 MG tablet Take 25 mg by mouth 3 (three) times daily.   Yes [provider]  lipase/protease/amylase (CREON) 12000 units CPEP capsule Take 1 capsule (12,000 Units total) by mouth 3 (three) times daily with meals. For pancreatitis 10/01/17  Yes Aline August, MD  loratadine (CLARITIN) 10 MG tablet Take 1 tablet (10 mg total) by mouth daily as needed for allergies or rhinitis. (May purchase from over the counter): For allergies 10/01/17  Yes Aline August, MD  metoprolol tartrate (LOPRESSOR) 25 MG tablet Take 1 tablet (25 mg total) by mouth 2 (two) times daily. For high blood pressure 08/29/17   Yes Clent Demark, PA-C  Multiple Vitamin (MULTIVITAMIN WITH MINERALS) TABS tablet Take 1 tablet by mouth daily. 09/06/17  Yes Bonnell Public, MD  omeprazole (PRILOSEC) 20 MG capsule Take 1 capsule (20 mg total) by mouth daily. 03/26/18  Yes Noemi Chapel, MD  pantoprazole (PROTONIX) 40 MG tablet Take 1 tablet (40 mg total) by mouth 2 (two) times daily. 06/19/18  Yes Malvin Johns, MD  promethazine (PHENERGAN) 25 MG tablet Take 25 mg by mouth daily as needed for nausea or vomiting.   Yes [provider]  QUEtiapine (SEROQUEL) 100 MG tablet Take 100 mg by mouth at bedtime.   Yes [provider]  sertraline (ZOLOFT) 100 MG tablet Take 1 tablet (100 mg total) by mouth daily. Patient taking differently: Take 150 mg by mouth daily.  08/29/17  Yes Clent Demark, PA-C  sucralfate (CARAFATE) 1 g tablet Take 1 tablet (1 g total) by mouth 4 (four) times daily. 06/19/18  Yes Malvin Johns, MD  ondansetron (ZOFRAN) 4 MG tablet Take 1 tablet (4 mg total) by mouth every 8 (eight) hours as needed for nausea or vomiting. Patient not taking: Reported on 5/57/3220 2/54/27   Delora Fuel, MD    Current Facility-Administered Medications  Medication Dose Route Frequency Provider Last Rate Last Dose  . [MAR Hold] folic acid (FOLVITE) tablet 1 mg  1 mg Oral Daily Elwyn Reach, MD   1 mg at 10/14/18 0935  . lactated ringers infusion   Intravenous Continuous [name], MD 100 mL/hr at 10/14/18 1018 1,000 mL at 10/14/18 1018  . lactated ringers infusion   Intravenous Continuous Arta Silence, MD      . Doug Sou Hold] LORazepam (ATIVAN) injection 0-4 mg  0-4 mg Intravenous Q6H Elwyn Reach, MD       Followed by  . [MAR Hold] LORazepam (ATIVAN) injection 0-4 mg  0-4 mg Intravenous Q12H Elwyn Reach, MD      . Doug Sou Hold] LORazepam (ATIVAN) tablet 1 mg  1 mg Oral Q6H PRN Elwyn Reach, MD       Or  . Doug Sou Hold] LORazepam (ATIVAN) injection 1 mg  1 mg Intravenous Q6H PRN  Elwyn Reach, MD      . Doug Sou Hold] multivitamin with minerals tablet 1 tablet  1 tablet Oral Daily Elwyn Reach, MD   1 tablet at 10/14/18 0935  . [MAR Hold] nicotine (NICODERM CQ - dosed in mg/24 hours) patch 21 mg  21 mg Transdermal Daily Elwyn Reach, MD   21 mg at 10/14/18 0934  . [MAR Hold] ondansetron (ZOFRAN) tablet 4 mg  4 mg Oral Q6H PRN Elwyn Reach, MD       Or  . Doug Sou Hold] ondansetron (ZOFRAN) injection 4 mg  4 mg Intravenous Q6H PRN Elwyn Reach, MD      . [  MAR Hold] pantoprazole (PROTONIX) injection 40 mg  40 mg Intravenous Q12H [name], MD      . Doug Sou Hold] thiamine (VITAMIN B-1) tablet 100 mg  100 mg Oral Daily Gala Romney L, MD   100 mg at 10/14/18 0935   Or  . [MAR Hold] thiamine (B-1) injection 100 mg  100 mg Intravenous Daily Elwyn Reach, MD        Allergies as of 10/13/2018 - Review Complete 10/13/2018  Allergen Reaction Noted  . Robaxin [methocarbamol] Other (See Comments) 07/20/2016  . Shellfish-derived products Nausea And Vomiting 08/18/2010  . Trazodone Other (See Comments) 09/28/2011  . Trazodone and nefazodone Other (See Comments) 09/28/2011  . Adhesive [tape] Itching 03/17/2018  . Latex Itching 03/17/2018  . Toradol [ketorolac tromethamine] Other (See Comments) 03/17/2018  . Contrast media [iodinated diagnostic agents] Hives 12/04/2016  . Reglan [metoclopramide] Other (See Comments) 11/18/2015    Family History  Problem Relation Age of Onset  . Hypertension Mother   . Cirrhosis Mother   . Alcoholism Mother   . Hypertension Father   . Melanoma Father   . Hypertension Other   . Coronary artery disease Other     Social History   Socioeconomic History  . Marital status: Single    Spouse name: Not on file  . Number of children: Not on file  . Years of education: Not on file  . Highest education level: Not on file  Occupational History  . Occupation: disabled  Social Needs  . Financial resource strain:  Not on file  . Food insecurity:    Worry: Not on file    Inability: Not on file  . Transportation needs:    Medical: Not on file    Non-medical: Not on file  Tobacco Use  . Smoking status: Current Every Day Smoker    Packs/day: 1.00    Years: 33.00    Pack years: 33.00    Types: Cigarettes, E-cigarettes  . Smokeless tobacco: Never Used  Substance and Sexual Activity  . Alcohol use: Yes  . Drug use: Yes    Types: Marijuana, Cocaine    Comment: daily marijuana use; last cocaine use about 3 months ago  . Sexual activity: Yes    Birth control/protection: None  Lifestyle  . Physical activity:    Days per week: Not on file    Minutes per session: Not on file  . Stress: Not on file  Relationships  . Social connections:    Talks on phone: Not on file    Gets together: Not on file    Attends religious service: Not on file    Active member of club or organization: Not on file    Attends meetings of clubs or organizations: Not on file    Relationship status: Not on file  . Intimate partner violence:    Fear of current or ex partner: Not on file    Emotionally abused: Not on file    Physically abused: Not on file    Forced sexual activity: Not on file  Other Topics Concern  . Not on file  Social History Narrative   ** Merged History Encounter **        Review of Systems: As per HPI, all others negative  Physical Exam: Vital signs in last 24 hours: Temp:  [97.8 F (36.6 C)-98.5 F (36.9 C)] 98 F (36.7 C) (06/07 1009) Pulse Rate:  [52-99] 95 (06/07 1009) Resp:  [8-20] 20 (06/07 1009) BP: (142-178)/(98-156)  163/104 (06/07 1019) SpO2:  [93 %-100 %] 98 % (06/07 1009) Weight:  [52.8 kg-63.5 kg] 52.8 kg (06/07 0203)   General:   Alert, thin, somewhat cachectic-appearing pleasant and cooperative in NAD Head:  Normocephalic and atraumatic. Eyes:  Sclera clear, no icterus.   Conjunctiva pink. Ears:  Normal auditory acuity. Nose:  No deformity, discharge,  or  lesions. Mouth:  No deformity or lesions.  Oropharynx pink & moist. Neck:  Supple; no masses or thyromegaly. Lungs:  Clear throughout to auscultation.   No wheezes, crackles, or rhonchi. No acute distress. Heart:  Regular rate and rhythm; no murmurs, clicks, rubs,  or gallops. Abdomen:  Soft, mild epigastric tenderness without peritonitis. No masses, hepatosplenomegaly or hernias noted. Normal bowel sounds, without guarding, and without rebound.     Msk:  Symmetrical without gross deformities. Normal posture. Pulses:  Normal pulses noted. Extremities:  Without clubbing or edema. Neurologic:  Alert and  oriented x4;  grossly normal neurologically. Skin:  Intact without significant lesions or rashes. Cervical Nodes:  No significant cervical adenopathy. Psych:  Alert and cooperative. Normal mood and affect.   Lab Results: Recent Labs    10/13/18 2141 10/14/18 0302 10/14/18 0853  WBC 8.6 9.6 7.6  HGB 11.6* 11.9* 11.6*  HCT 33.1* 33.3* 33.4*  PLT 227 228 223   BMET Recent Labs    10/13/18 2022 10/14/18 0302  NA 137 137  K 3.0* 3.8  CL 107 102  CO2 19* 19*  GLUCOSE 79 81  BUN 19 19  CREATININE 1.49* 1.43*  CALCIUM 6.7* 8.6*   LFT Recent Labs    10/14/18 0302  PROT 7.1  ALBUMIN 4.1  AST 48*  ALT 50*  ALKPHOS 103  BILITOT 1.4*   PT/INR No results for input(s): LABPROT, INR in the last 72 hours.  Studies/Results: No results found.  Impression:  1.  Hematemesis. 2.  Acute blood loss anemia, mild. 3.  History ulcers. 4.  + NSAIDs.  Plan:  1.  PPI, serial CBCs, transfuse. 2.  Endoscopy. 3.  Risks (bleeding, infection, bowel perforation that could require surgery, sedation-related changes in cardiopulmonary systems), benefits (identification and possible treatment of source of symptoms, exclusion of certain causes of symptoms), and alternatives (watchful waiting, radiographic imaging studies, empiric medical treatment) of upper endoscopy (EGD) were explained to  patient/family in detail and patient wishes to proceed.   LOS: 0 days   Saqib Cazarez M  10/14/2018, 10:30 AM  Cell [phone] If no answer or after 5 PM call [phone]

## 2018-10-14 NOTE — H&P (Signed)
History and Physical   Richard Sudeep Scheibel OAC:[id number] DOB: [birth year] DOA: 10/13/2018  Referring MD/NP/PA: Dr Rex Kras  PCP: Marliss Coots, NP   Outpatient Specialists: None   Patient coming from: Home  Chief Complaint: Vomiting and Chest pain  HPI: [name] is a 48 y.o. male with medical history significant of HTN, Bipolar Disorder, WPW, Polysubstance abuse, chronic Pancreatitis, Cocaine abuse, GERD, chronic low back pain who presented to the ER with 1 day of epigastric chest pain and persistent vomiting.  Patient's last drink was this morning.  He had multiple bouts of vomiting including blood.  He felt dizziness with lightheadedness.  Also had some diaphoresis some shortness of breath.  Denied any fever or chills.  Patient was seen in the ER with some guaiac positive stools.  Hemoglobin is however stable.  Patient is being admitted with upper GI bleed as well as nausea vomiting.  GI consulted for EGD in the morning and patient is being admitted to the medical service..  ED Course: Temperature is 98.5 blood pressure 142/106, pulse 99 respirate of 20 oxygen sat 93% room air.  Sodium 137 potassium 3.0 chloride 107 CO2 of 19 glucose 79 creatinine 1.49 calcium 6.7 albumin 3.2 AST 42 total protein 5.7.  White count is 8.6 hemoglobin 11.6 and platelets 227.  COVID-19 testing is negative.Patient was given IV Protonix and is being admitted to the hospital for work-up.  Review of Systems: As per HPI otherwise 10 point review of systems negative.    Past Medical History:  Diagnosis Date  . Alcoholism /alcohol abuse ([place])   . Anemia   . Anxiety   . Arthritis    "knees; arms; elbows" (03/26/2015)  . Asthma   . Bipolar disorder ([place])   . Chronic bronchitis ([place])   . Chronic lower back pain   . Chronic pancreatitis ([place])   . Cocaine abuse ([place])   . Depression   . Family history of adverse reaction to anesthesia    "grandmother gets confused"  . Femoral condyle fracture  ([place]) 03/08/2014   left medial/notes 03/09/2014  . GERD (gastroesophageal reflux disease)   . H/O hiatal hernia   . H/O suicide attempt 10/2012  . Heart murmur    "when he was little" (03/06/2013)  . High cholesterol   . History of blood transfusion 10/2012   "when I tried to commit suicide"  . History of stomach ulcers   . Hypertension   . Marijuana abuse, continuous   . Migraine    "a few times/year" (03/26/2015)  . Pneumonia 1990's X 3  . PTSD (post-traumatic stress disorder)   . Shortness of breath    "can happen at anytime" (03/06/2013)  . Sickle cell trait ([place])   . WPW (Wolff-Parkinson-White syndrome)    [name] 03/06/2013    Past Surgical History:  Procedure Laterality Date  . BIOPSY  11/25/2017   Procedure: BIOPSY;  Surgeon: Arta Silence, MD;  Location: [facility];  Service: Endoscopy;;  . CARDIAC CATHETERIZATION    . ESOPHAGOGASTRODUODENOSCOPY (EGD) WITH PROPOFOL N/A 11/25/2017   Procedure: ESOPHAGOGASTRODUODENOSCOPY (EGD) WITH PROPOFOL;  Surgeon: Arta Silence, MD;  Location: [facility];  Service: Endoscopy;  Laterality: N/A;  . EYE SURGERY Left 1990's   "result of trauma"   . FACIAL FRACTURE SURGERY Left 1990's   "result of trauma"   . FRACTURE SURGERY    . HERNIA REPAIR    . LEFT HEART CATHETERIZATION WITH CORONARY ANGIOGRAM Right 03/07/2013   Procedure: LEFT HEART CATHETERIZATION WITH CORONARY  ANGIOGRAM;  Surgeon: [name], MD;  Location: [facility] CATH LAB;  Service: Cardiovascular;  Laterality: Right;  . UMBILICAL HERNIA REPAIR       reports that he has been smoking cigarettes and e-cigarettes. He has a 33.00 pack-year smoking history. He has never used smokeless tobacco. He reports current alcohol use. He reports current drug use. Drugs: Marijuana and Cocaine.  Allergies  Allergen Reactions  . Robaxin [Methocarbamol] Other (See Comments)    "jumpy limbs"  . Shellfish-Derived Products Nausea And Vomiting  . Trazodone Other (See Comments)    Muscle  spasms  . Trazodone And Nefazodone Other (See Comments)    Muscle spasms  . Adhesive [Tape] Itching  . Latex Itching  . Toradol [Ketorolac Tromethamine] Other (See Comments)    Has ulcers; cannot have this  . Contrast Media [Iodinated Diagnostic Agents] Hives  . Reglan [Metoclopramide] Other (See Comments)    Muscle spasms    Family History  Problem Relation Age of Onset  . Hypertension Mother   . Cirrhosis Mother   . Alcoholism Mother   . Hypertension Father   . Melanoma Father   . Hypertension Other   . Coronary artery disease Other      Prior to Admission medications   Medication Sig Start Date End Date Taking? Authorizing Provider  acetaminophen (TYLENOL) 500 MG tablet Take 1,000 mg by mouth every 6 (six) hours as needed for mild pain.   Yes [provider]  Albuterol Sulfate (PROAIR HFA IN) Inhale 2 puffs into the lungs every 6 (six) hours as needed (for shortness of breath).   Yes [provider]  Cyanocobalamin (VITAMIN B-12 PO) Take 1 tablet by mouth daily.   Yes [provider]  famotidine (PEPCID) 20 MG tablet Take 1 tablet (20 mg total) by mouth 2 (two) times daily. 04/06/18  Yes Orlie Dakin, MD  folic acid (FOLVITE) 1 MG tablet Take 1 tablet (1 mg total) by mouth daily. 11/26/17  Yes [name], MD  HYDRALAZINE HCL PO Take 1 tablet by mouth every 8 (eight) hours.   Yes [provider]  hydrOXYzine (ATARAX/VISTARIL) 25 MG tablet Take 25 mg by mouth 3 (three) times daily.   Yes [provider]  lipase/protease/amylase (CREON) 12000 units CPEP capsule Take 1 capsule (12,000 Units total) by mouth 3 (three) times daily with meals. For pancreatitis 10/01/17  Yes Aline August, MD  loratadine (CLARITIN) 10 MG tablet Take 1 tablet (10 mg total) by mouth daily as needed for allergies or rhinitis. (May purchase from over the counter): For allergies 10/01/17  Yes Aline August, MD  metoprolol tartrate (LOPRESSOR) 25 MG tablet  Take 1 tablet (25 mg total) by mouth 2 (two) times daily. For high blood pressure 08/29/17  Yes Clent Demark, PA-C  Multiple Vitamin (MULTIVITAMIN WITH MINERALS) TABS tablet Take 1 tablet by mouth daily. 09/06/17  Yes Bonnell Public, MD  omeprazole (PRILOSEC) 20 MG capsule Take 1 capsule (20 mg total) by mouth daily. 03/26/18  Yes Noemi Chapel, MD  pantoprazole (PROTONIX) 40 MG tablet Take 1 tablet (40 mg total) by mouth 2 (two) times daily. 06/19/18  Yes Malvin Johns, MD  promethazine (PHENERGAN) 25 MG tablet Take 25 mg by mouth daily as needed for nausea or vomiting.   Yes [provider]  QUEtiapine (SEROQUEL) 100 MG tablet Take 100 mg by mouth at bedtime.   Yes [provider]  sertraline (ZOLOFT) 100 MG tablet Take 1 tablet (100 mg total)  by mouth daily. Patient taking differently: Take 150 mg by mouth daily.  08/29/17  Yes Clent Demark, PA-C  sucralfate (CARAFATE) 1 g tablet Take 1 tablet (1 g total) by mouth 4 (four) times daily. 06/19/18  Yes Malvin Johns, MD  ondansetron (ZOFRAN) 4 MG tablet Take 1 tablet (4 mg total) by mouth every 8 (eight) hours as needed for nausea or vomiting. Patient not taking: Reported on 5/46/5681 2/75/17   Delora Fuel, MD    Physical Exam: Vitals:   10/14/18 0000 10/14/18 0006 10/14/18 0030 10/14/18 0045  BP:   (!) 143/109 (!) 150/107  Pulse: 99  83   Resp:   11 17  Temp:      TempSrc:      SpO2: 98%  97%   Weight:  63.5 kg    Height:  5\' 9"  (1.753 m)        Constitutional: NAD, tremulous Vitals:   10/14/18 0000 10/14/18 0006 10/14/18 0030 10/14/18 0045  BP:   (!) 143/109 (!) 150/107  Pulse: 99  83   Resp:   11 17  Temp:      TempSrc:      SpO2: 98%  97%   Weight:  63.5 kg    Height:  5\' 9"  (1.753 m)     Eyes: PERRL, lids and conjunctivae normal ENMT: Mucous membranes are moist. Posterior pharynx clear of any exudate or lesions.Normal dentition.  Neck: normal, supple, no masses, no thyromegaly  Respiratory: clear to auscultation bilaterally, no wheezing, no crackles. Normal respiratory effort. No accessory muscle use.  Cardiovascular: Regular rate and rhythm, no murmurs / rubs / gallops. No extremity edema. 2+ pedal pulses. No carotid bruits.  Abdomen: Diffuse epigastric tenderness, no masses palpated. No hepatosplenomegaly. Bowel sounds positive.  Musculoskeletal: no clubbing / cyanosis. No joint deformity upper and lower extremities. Good ROM, no contractures. Normal muscle tone.  Skin: no rashes, lesions, ulcers. No induration Neurologic: CN 2-12 grossly intact. Sensation intact, DTR normal. Strength 5/5 in all 4.  Psychiatric: Normal judgment and insight. Alert and oriented x 3. Normal mood.     Labs on Admission: I have personally reviewed following labs and imaging studies  CBC: Recent Labs  Lab 10/13/18 2141  WBC 8.6  NEUTROABS 5.4  HGB 11.6*  HCT 33.1*  MCV 93.5  PLT 001   Basic Metabolic Panel: Recent Labs  Lab 10/13/18 2022  NA 137  K 3.0*  CL 107  CO2 19*  GLUCOSE 79  BUN 19  CREATININE 1.49*  CALCIUM 6.7*   GFR: Estimated Creatinine Clearance: 53.9 mL/min (A) (by C-G formula based on SCr of 1.49 mg/dL (H)). Liver Function Tests: Recent Labs  Lab 10/13/18 2022  AST 42*  ALT 39  ALKPHOS 71  BILITOT 0.8  PROT 5.7*  ALBUMIN 3.2*   Recent Labs  Lab 10/13/18 2022  LIPASE 26   No results for input(s): AMMONIA in the last 168 hours. Coagulation Profile: No results for input(s): INR, PROTIME in the last 168 hours. Cardiac Enzymes: Recent Labs  Lab 10/13/18 2022  TROPONINI <0.03   BNP (last 3 results) No results for input(s): PROBNP in the last 8760 hours. HbA1C: No results for input(s): HGBA1C in the last 72 hours. CBG: No results for input(s): GLUCAP in the last 168 hours. Lipid Profile: No results for input(s): CHOL, HDL, LDLCALC, TRIG, CHOLHDL, LDLDIRECT in the last 72 hours. Thyroid Function Tests: No results for input(s): TSH,  T4TOTAL, FREET4, T3FREE, THYROIDAB in the last  72 hours. Anemia Panel: No results for input(s): VITAMINB12, FOLATE, FERRITIN, TIBC, IRON, RETICCTPCT in the last 72 hours. Urine analysis:    Component Value Date/Time   COLORURINE YELLOW 06/19/2018 [place] 06/19/2018 0747   LABSPEC 1.020 06/19/2018 0747   PHURINE 5.0 06/19/2018 0747   GLUCOSEU NEGATIVE 06/19/2018 0747   HGBUR NEGATIVE 06/19/2018 0747   HGBUR negative 04/30/2010 1020   BILIRUBINUR NEGATIVE 06/19/2018 0747   KETONESUR NEGATIVE 06/19/2018 0747   PROTEINUR NEGATIVE 06/19/2018 0747   UROBILINOGEN 0.2 03/15/2015 0508   NITRITE NEGATIVE 06/19/2018 0747   LEUKOCYTESUR NEGATIVE 06/19/2018 0747   Sepsis Labs: @LABRCNTIP (procalcitonin:4,lacticidven:4) )No results found for this or any previous visit (from the past 240 hour(s)).   Radiological Exams on Admission: No results found.    Assessment/Plan Principal Problem:   Hematemesis Active Problems:   Severe protein-calorie malnutrition (HCC)   Benign essential HTN   Polysubstance abuse (tobacco, cocaine, THC, and ETOH)   Major depressive disorder, recurrent severe without psychotic features ([place])   Acute blood loss anemia     #1 hematemesis: Patient is having binge drinking.  This could be secondary to Mallory-Weiss tear or acute gastritis or esophagitis.  We will admit the patient with serial CBCs.  IV Protonix.  GI consulted for possible EGD in the morning.  #2 alcohol abuse: Last drink was this morning.  Patient is at risk for DTs.  CIWA protocol be initiated  #3 tobacco abuse: Nicotine patch will be ordered with tobacco cessation counseling.  #4 hypertension: Hold blood pressure medications with acute GI bleed.  #5 anemia: Blood loss but current hemoglobin is at baseline.  #6 hypokalemia: Secondary to vomiting.  Replete potassium  #7 acute kidney injury: Most likely prerenal.  Hydrate patient and follow renal function   DVT prophylaxis:  SCD Code Status: Full code Family Communication: No family at bedside discussed care with patient Disposition Plan: Home Consults called: Gastroenterology Admission status: Inpatient  Severity of Illness: The appropriate patient status for this patient is INPATIENT. Inpatient status is judged to be reasonable and necessary in order to provide the required intensity of service to ensure the patient's safety. The patient's presenting symptoms, physical exam findings, and initial radiographic and laboratory data in the context of their chronic comorbidities is felt to place them at high risk for further clinical deterioration. Furthermore, it is not anticipated that the patient will be medically stable for discharge from the hospital within 2 midnights of admission. The following factors support the patient status of inpatient.   " The patient's presenting symptoms include epigastric pain with vomiting. " The worrisome physical exam findings include mild epigastric tenderness. " The initial radiographic and laboratory data are worrisome because of normal hemoglobin. " The chronic co-morbidities include chronic alcoholism.   * I certify that at the point of admission it is my clinical judgment that the patient will require inpatient hospital care spanning beyond 2 midnights from the point of admission due to high intensity of service, high risk for further deterioration and high frequency of surveillance required.Barbette Merino MD [facility] Pager [phone]  If 7PM-7AM, please contact night-coverage [email or web address] Password [facility]  10/14/2018, 12:57 AM

## 2018-10-14 NOTE — Progress Notes (Signed)
Patient drinking oral contrast and tolerating well.   Finishing up last little bit now.  Awaiting CT abd/pelvis

## 2018-10-14 NOTE — Plan of Care (Signed)
Pt arrived to the unit with ED staff. A/O X4. C/O abdominal pain. Skin warm and dry. Pt able to communicate needs. Call bell within reach. No respiratory distress noted at this moment, will continue monitoring.

## 2018-10-15 LAB — BASIC METABOLIC PANEL
Anion gap: 8 (ref 5–15)
BUN: <5 mg/dL — ABNORMAL LOW (ref 6–20)
CO2: 25 mmol/L (ref 22–32)
Calcium: 8.5 mg/dL — ABNORMAL LOW (ref 8.9–10.3)
Chloride: 103 mmol/L (ref 98–111)
Creatinine, Ser: 0.81 mg/dL (ref 0.61–1.24)
GFR calc Af Amer: >60 mL/min (ref >60)
GFR calc non Af Amer: >60 mL/min (ref >60)
Glucose, Bld: 120 mg/dL — ABNORMAL HIGH (ref 70–99)
Potassium: 3.8 mmol/L (ref 3.5–5.1)
Sodium: 136 mmol/L (ref 135–145)

## 2018-10-15 LAB — CBC
HCT: 28.7 % — ABNORMAL LOW (ref 39.0–52.0)
Hemoglobin: 9.7 g/dL — ABNORMAL LOW (ref 13.0–17.0)
MCH: 32.4 pg (ref 26.0–34.0)
MCHC: 33.8 g/dL (ref 30.0–36.0)
MCV: 96 fL (ref 80.0–100.0)
Platelets: 176 10*3/uL (ref 150–400)
RBC: 2.99 MIL/uL — ABNORMAL LOW (ref 4.22–5.81)
RDW: 13.4 % (ref 11.5–15.5)
WBC: 11.4 10*3/uL — ABNORMAL HIGH (ref 4.0–10.5)
nRBC: 0 % (ref 0.0–0.2)

## 2018-10-15 MED ORDER — THIAMINE HCL 100 MG PO TABS: 100.0000 mg | ORAL_TABLET | Freq: Every day | ORAL | 0 refills | Status: DC

## 2018-10-15 MED ORDER — PANTOPRAZOLE SODIUM 40 MG PO TBEC: 40.0000 mg | DELAYED_RELEASE_TABLET | Freq: Two times a day (BID) | ORAL | 0 refills | Status: DC

## 2018-10-15 MED ORDER — PANCRELIPASE (LIP-PROT-AMYL) 12000-38000 UNITS PO CPEP: 12000.0000 [IU] | ORAL_CAPSULE | Freq: Three times a day (TID) | ORAL | 0 refills | Status: DC

## 2018-10-15 MED ORDER — SUCRALFATE 1 GM/10ML PO SUSP: 1.0000 g | Freq: Three times a day (TID) | ORAL | 0 refills | Status: DC

## 2018-10-15 MED ORDER — GABAPENTIN 100 MG PO CAPS: 100.0000 mg | ORAL_CAPSULE | Freq: Two times a day (BID) | ORAL | 0 refills | Status: DC

## 2018-10-15 MED ORDER — AMITRIPTYLINE HCL 25 MG PO TABS: 25.0000 mg | ORAL_TABLET | Freq: Every day | ORAL | 0 refills | Status: DC

## 2018-10-15 MED ORDER — NICOTINE 21 MG/24HR TD PT24: 21.0000 mg | MEDICATED_PATCH | Freq: Every day | TRANSDERMAL | 0 refills | Status: DC

## 2018-10-15 MED ORDER — AMITRIPTYLINE HCL 25 MG PO TABS
25.0000 mg | ORAL_TABLET | Freq: Every day | ORAL | Status: DC
  Filled 2018-10-15: qty 1

## 2018-10-15 MED ORDER — GABAPENTIN 100 MG PO CAPS
100.0000 mg | ORAL_CAPSULE | Freq: Two times a day (BID) | ORAL | Status: DC
  Administered 2018-10-15: 100 mg via ORAL

## 2018-10-15 MED FILL — THIAMINE HCL 100 MG TABS: 100 | 30 days supply | Qty: 30 | Fill #0

## 2018-10-15 MED FILL — CREON DR 12,000 UNITS CAP: 12000 | 30 days supply | Qty: 90 | Fill #0

## 2018-10-15 MED FILL — GABAPENTIN 100 MG CAPSULE: 100 | 30 days supply | Qty: 60 | Fill #0

## 2018-10-15 MED FILL — SUCRALFATE 1 GM/10ML SUSP: 1 | 10 days supply | Qty: 420 | Fill #0

## 2018-10-15 MED FILL — PANTOPRAZOLE SOD DR 40 MG T: 40 | 30 days supply | Qty: 60 | Fill #0

## 2018-10-15 MED FILL — AMITRIPTYLINE HCL 25 MG TAB: 25 | 30 days supply | Qty: 30 | Fill #0

## 2018-10-15 NOTE — TOC Initial Note (Signed)
Transition of Care (TOC) - Initial/Assessment Note    Patient Details  Name: [name] MRN: [id number] Date of Birth: 05/12/1970  Transition of Care (TOC) CM/SW Contact:     C , LCSW Phone Number: 10/15/2018, 1:23 PM  Clinical Narrative: CSW met with patient, introduced role, and inquired about interest in substance abuse resources. Patient agreeable. Provided list for inpatient and outpatient options. Discussed lack of insurance. He does not work. Encouraged him to go to DSS and see if he would qualify for Medicaid. No further concerns.            Expected Discharge Plan: Home/Self Care Barriers to Discharge: Barriers Resolved   Patient Goals and CMS Choice Patient states their goals for this hospitalization and ongoing recovery are:: home CMS Medicare.gov Compare Post Acute Care list provided to:: Patient Choice offered to / list presented to : NA  Expected Discharge Plan and Services Expected Discharge Plan: Home/Self Care In-house Referral: Clinical Social Work Discharge Planning Services: CM Consult, MATCH Program, Medication Assistance Post Acute Care Choice: NA Living arrangements for the past 2 months: Single Family Home Expected Discharge Date: 10/15/18               DME Arranged: N/A DME Agency: NA       HH Arranged: NA HH Agency: NA        Prior Living Arrangements/Services Living arrangements for the past 2 months: Single Family Home Lives with:: Self Patient language and need for interpreter reviewed:: Yes(No needs) Do you feel safe going back to the place where you live?: Yes      Need for Family Participation in Patient Care: No (Comment) Care giver support system in place?: No (comment)(Independent)   Criminal Activity/Legal Involvement Pertinent to Current Situation/Hospitalization: No - Comment as needed  Activities of Daily Living Home Assistive Devices/Equipment: None ADL Screening (condition at time of  admission) Patient's cognitive ability adequate to safely complete daily activities?: Yes Is the patient deaf or have difficulty hearing?: No Does the patient have difficulty seeing, even when wearing glasses/contacts?: No Does the patient have difficulty concentrating, remembering, or making decisions?: No Patient able to express need for assistance with ADLs?: Yes Does the patient have difficulty dressing or bathing?: No Independently performs ADLs?: Yes (appropriate for developmental age) Does the patient have difficulty walking or climbing stairs?: No Weakness of Legs: None Weakness of Arms/Hands: None  Permission Sought/Granted   Permission granted to share information with : No              Emotional Assessment Appearance:: Appears stated age Attitude/Demeanor/Rapport: Engaged Affect (typically observed): Accepting, Flat Orientation: : Oriented to Self, Oriented to Place, Oriented to  Time, Oriented to Situation Alcohol / Substance Use: Alcohol Use, Illicit Drugs, Tobacco Use Psych Involvement: No (comment)  Admission diagnosis:  Acute upper GI bleed [K92.2] Patient Active Problem List   Diagnosis Date Noted  . Gastritis and gastroduodenitis   . Chest pain 01/08/2018  . GI bleed 11/24/2017  . Acute blood loss anemia 11/24/2017  . Atypical chest pain 11/24/2017  . Acute pancreatitis 09/28/2017  . Abdominal pain 05/27/2017  . Hematemesis 05/27/2017  . Tachycardia 03/18/2017  . Diarrhea 03/18/2017  . Acute on chronic pancreatitis (HCC) 12/17/2016  . Intractable nausea and vomiting 12/05/2016  . Verbally abusive behavior 12/05/2016  . Normocytic anemia 12/05/2016  . Alcohol use disorder, severe, dependence (HCC) 07/25/2016  . Cocaine use disorder, severe, dependence (HCC) 07/25/2016  . Major depressive disorder, recurrent severe   without psychotic features ([place]) 07/20/2016  . Leukocytosis   . Hospital acquired PNA 05/20/2015  . Chronic pancreatitis ([place]) 05/18/2015   . Pseudocyst of pancreas 05/18/2015  . Polysubstance abuse (tobacco, cocaine, THC, and ETOH) 03/26/2015  . Alcohol-induced chronic pancreatitis ([place])   . Benign essential HTN 02/06/2014  . Alcohol-induced acute pancreatitis 11/28/2013  . Pancreatic pseudocyst/cyst 11/25/2013  . Severe protein-calorie malnutrition ([place]) 10/10/2013  . Suicide attempt ([place]) 10/08/2013  . 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 TOBACCO ABUSE 03/23/2007   PCP:  Marliss Coots, NP Pharmacy:   [facility] [address] [place] [address] Phone: [phone] Fax: [place] [place] [address] Phone: [phone] Fax: [phone]  [facility] [place] [address] [place] [address] Phone: [phone] Fax: [phone]     Social Determinants of Health ([place]) Interventions    Readmission Risk Interventions Readmission Risk Prevention Plan 10/15/2018  Transportation Screening Complete  Medication Review ([place]) Complete  PCP or Specialist appointment within 3-5 days of discharge Complete  HRI or [place] Complete  SW Recovery Care/Counseling Consult Complete  [place] Not Applicable  Some recent data might be hidden

## 2018-10-15 NOTE — Progress Notes (Signed)
Discharge instructions given to patient he is waiting on pharmacy to deliver his RX meds then he can go for discharge. His PIV has been removed with catheter intact dry dressing has been applied to previsous IV site. No distress noted.

## 2018-10-15 NOTE — Discharge Summary (Signed)
[facility] Discharge Summary   Patient: [name] [id number]:[id number]   PCP: Marliss Coots, NP DOB: [birth year]   Date of admission: 10/13/2018   Date of discharge: 10/15/2018     Discharge Diagnoses:   Principal Problem:   Hematemesis Active Problems:   Severe protein-calorie malnutrition ([place])   Benign essential HTN   Polysubstance abuse (tobacco, cocaine, THC, and ETOH)   Major depressive disorder, recurrent severe without psychotic features ([place])   Acute blood loss anemia   Admitted From: Home Disposition:  Home   Recommendations for Outpatient Follow-up:  1. Please follow-up with PCP in 1 week 2. Please follow-up with [facility] gastroenterology  Follow-up Information    [name], [name], NP. Schedule an appointment as soon as possible for a visit in 1 week.   Why:  Left message for office to call patient Contact information: [place] [address] [phone]        Gastroenterology, Sadie Haber. Schedule an appointment as soon as possible for a visit in 1 month.   Contact information: [address] [place] [address] [phone]          Diet recommendation: Low-fat diet  Activity: The patient is advised to gradually reintroduce usual activities,as tolerated .  Discharge Condition: good  Code Status: Full code  History of present illness: As per the H and P dictated on admission, "[name] is a 48 y.o. male with medical history significant of HTN, Bipolar Disorder, WPW, Polysubstance abuse, chronic Pancreatitis, Cocaine abuse, GERD, chronic low back pain who presented to the ER with 1 day of epigastric chest pain and persistent vomiting.  Patient's last drink was this morning.  He had multiple bouts of vomiting including blood.  He felt dizziness with lightheadedness.  Also had some diaphoresis some shortness of breath.  Denied any fever or chills.  Patient was seen in the ER with some guaiac positive stools.   Hemoglobin is however stable.  Patient is being admitted with upper GI bleed as well as nausea vomiting.  GI consulted for EGD in the morning and patient is being admitted to the medical service..  ED Course: Temperature is 98.5 blood pressure 142/106, pulse 99 respirate of 20 oxygen sat 93% room air.  Sodium 137 potassium 3.0 chloride 107 CO2 of 19 glucose 79 creatinine 1.49 calcium 6.7 albumin 3.2 AST 42 total protein 5.7.  White count is 8.6 hemoglobin 11.6 and platelets 227.  COVID-19 testing is negative.Patient was given IV Protonix and is being admitted to the hospital for work-up."  Hospital Course:  Summary of his active problems in the hospital is as following.  1 hematemesis:  S/P EGD Multiple nonbleeding gastric ulcers. Patient is having binge drinking.  Also using antacids. EGD was performed by [facility] gastroenterology. Showed multiple gastric ulcer nonbleeding. Biopsy performed Gastrin level was sent with a concern for possible gastric triggering tumor causing ulcers. CT abdomen pelvis negative for any acute abnormality. H&H remained stable.  No further episodes of vomiting or hematemesis here in the hospital.  2 alcohol abuse:  Patient reports that he will check himself into alcohol rehabilitation once he is discharged.  3 tobacco abuse: Nicotine patch  Did tobacco cessation counseling.  4 hypertension:  Blood pressure stable.  Stopping metoprolol given the patient's use of cocaine.  5 anemia:  Hemoglobin stable monitor his baseline.  Likely from slow GI bleed.  6 hypokalemia:  Secondary to vomiting.  Replete potassium  7 acute kidney  injury:  Most likely prerenal.  Hydrate patient and follow renal function Improved with hydration.  8.  Chronic pancreatitis. Lipase level normal.  No evidence of acute pancreatitis. Patient does have abdominal pain.  Will add amitriptyline and gabapentin.  9.  Substance abuse disorder. Cocaine positive on  admission. Patient counseled to stop abusing alcohol cocaine as well as smoking.  - Patient was instructed, not to drive, operate heavy machinery, perform activities at heights, swimming or participation in water activities or provide baby sitting services while on Pain, Sleep and Anxiety Medications; until his outpatient Physician has advised to do so again.  - Also recommended to not to take more than prescribed Pain, Sleep and Anxiety Medications.  Patient was ambulatory without any assistance. On the day of the discharge the patient's vitals were stable , and no other acute medical condition were reported by patient. the patient was felt safe to be discharge at Home with  family  Consultants: gastroenterology  Procedures: egd   DISCHARGE MEDICATION: Allergies as of 10/15/2018      Reactions   Robaxin [methocarbamol] Other (See Comments)   "jumpy limbs"   Shellfish-derived Products Nausea And Vomiting   Trazodone Other (See Comments)   Muscle spasms   Trazodone And Nefazodone Other (See Comments)   Muscle spasms   Adhesive [tape] Itching   Latex Itching   Toradol [ketorolac Tromethamine] Other (See Comments)   Has ulcers; cannot have this   Contrast Media [iodinated Diagnostic Agents] Hives   Reglan [metoclopramide] Other (See Comments)   Muscle spasms      Medication List    STOP taking these medications   acetaminophen 500 MG tablet Commonly known as:  TYLENOL   famotidine 20 MG tablet Commonly known as:  Pepcid   HYDRALAZINE HCL PO   metoprolol tartrate 25 MG tablet Commonly known as:  LOPRESSOR   omeprazole 20 MG capsule Commonly known as:  PRILOSEC   PROAIR HFA IN   sucralfate 1 g tablet Commonly known as:  CARAFATE Replaced by:  sucralfate 1 GM/10ML suspension     TAKE these medications   amitriptyline 25 MG tablet Commonly known as:  ELAVIL Take 1 tablet (25 mg total) by mouth at bedtime. Notes to patient:  10/15/18@8  pm   folic acid 1 MG  tablet Commonly known as:  FOLVITE Take 1 tablet (1 mg total) by mouth daily. Notes to patient:  10/16/2018@8  am   gabapentin 100 MG capsule Commonly known as:  NEURONTIN Take 1 capsule (100 mg total) by mouth 2 (two) times daily. Notes to patient:  10/15/18@5  pm   hydrOXYzine 25 MG tablet Commonly known as:  ATARAX/VISTARIL Take 25 mg by mouth 3 (three) times daily. Notes to patient:  10/15/2018@5  pm   lipase/protease/amylase 12000 units Cpep capsule Commonly known as:  CREON Take 1 capsule (12,000 Units total) by mouth 3 (three) times daily with meals. For pancreatitis Notes to patient:  10/15/18@5  pm   loratadine 10 MG tablet Commonly known as:  CLARITIN Take 1 tablet (10 mg total) by mouth daily as needed for allergies or rhinitis. (May purchase from over the counter): For allergies Notes to patient:  10/16/18@8  am   multivitamin with minerals Tabs tablet Take 1 tablet by mouth daily. Notes to patient:  10/16/18@8  am   nicotine 21 mg/24hr patch Commonly known as:  NICODERM CQ - dosed in mg/24 hours Place 1 patch (21 mg total) onto the skin daily. Start taking on:  October 16, 2018 Notes to patient:  10/16/18@8  am   pantoprazole 40 MG tablet Commonly known as:  PROTONIX Take 1 tablet (40 mg total) by mouth 2 (two) times daily before a meal. What changed:  when to take this Notes to patient:  10/15/18@5  pm   promethazine 25 MG tablet Commonly known as:  PHENERGAN Take 25 mg by mouth daily as needed for nausea or vomiting.   QUEtiapine 100 MG tablet Commonly known as:  SEROQUEL Take 100 mg by mouth at bedtime. Notes to patient:  10/15/18@8  pm   sertraline 100 MG tablet Commonly known as:  ZOLOFT Take 1 tablet (100 mg total) by mouth daily. What changed:  how much to take Notes to patient:  10/16/18@8am    sucralfate 1 GM/10ML suspension Commonly known as:  CARAFATE Take 10 mLs (1 g total) by mouth 4 (four) times daily -  with meals and at bedtime. Replaces:   sucralfate 1 g tablet Notes to patient:  10/15/18@5  pm   thiamine 100 MG tablet Take 1 tablet (100 mg total) by mouth daily. Start taking on:  October 16, 2018 Notes to patient:  10/16/18@8  am   VITAMIN B-12 PO Take 1 tablet by mouth daily. Notes to patient:  10/16/18@8  am      Allergies  Allergen Reactions   Robaxin [Methocarbamol] Other (See Comments)    "jumpy limbs"   Shellfish-Derived Products Nausea And Vomiting   Trazodone Other (See Comments)    Muscle spasms   Trazodone And Nefazodone Other (See Comments)    Muscle spasms   Adhesive [Tape] Itching   Latex Itching   Toradol [Ketorolac Tromethamine] Other (See Comments)    Has ulcers; cannot have this   Contrast Media [Iodinated Diagnostic Agents] Hives   Reglan [Metoclopramide] Other (See Comments)    Muscle spasms   Discharge Instructions    Diet - low sodium heart healthy   Complete by:  As directed    Discharge instructions   Complete by:  As directed    It is important that you read the given instructions as well as go over your medication list with RN to help you understand your care after this hospitalization.  Discharge Instructions: Please follow-up with PCP in 1-2 weeks  Please request your primary care physician to go over all Hospital Tests and Procedure/Radiological results at the follow up. Please get all Hospital records sent to your PCP by signing hospital release before you go home.   Do not take more than prescribed Pain, Sleep and Anxiety Medications. You were cared for by a hospitalist during your hospital stay. If you have any questions about your discharge medications or the care you received while you were in the hospital after you are discharged, you can call the unit @UNIT @ you were admitted to and ask to speak with the hospitalist on call if the hospitalist that took care of you is not available.  Once you are discharged, your primary care physician will handle any further medical  issues. Please note that NO REFILLS for any discharge medications will be authorized once you are discharged, as it is imperative that you return to your primary care physician (or establish a relationship with a primary care physician if you do not have one) for your aftercare needs so that they can reassess your need for medications and monitor your lab values. You Must read complete instructions/literature along with all the possible adverse reactions/side effects for all the Medicines you take and that have been prescribed to you. Take any new Medicines  after you have completely understood and accept all the possible adverse reactions/side effects. Wear Seat belts while driving. If you have smoked or chewed Tobacco in the last 2 yrs please stop smoking and/or stop any Recreational drug use.  If you drink alcohol, please STOP the use and do not drive, operating heavy machinery, perform activities at heights, swimming or participation in water activities or provide baby sitting services under influence.   Increase activity slowly   Complete by:  As directed      Discharge Exam: Filed Weights   10/14/18 0006 10/14/18 0203 10/15/18 0434  Weight: 63.5 kg 52.8 kg 54.8 kg   Vitals:   10/15/18 0432 10/15/18 1131  BP: (!) 150/102 (!) 145/98  Pulse: 93 97  Resp: 18 18  Temp: (!) 97.5 F (36.4 C) 98.4 F (36.9 C)  SpO2: 100% 100%   General: Appear in no distress, no Rash; Oral Mucosa Clear, moist.  Abnormal Mass Or lumps Cardiovascular: S1 and S2 Present, no Murmur, Respiratory: normal respiratory effort, Bilateral Air entry present and Clear to Auscultation, no Crackles, no wheezes Abdomen: Bowel Sound present, Soft and mild tenderness, no hernia Extremities: no Pedal edema, no calf tenderness Neurology: alert and oriented to time, place, and person affect appropriate. normal without focal findings, mental status, speech normal, alert and oriented x3, PERLA, Motor strength 5/5 and symmetric  and sensation grossly normal to light touch   The results of significant diagnostics from this hospitalization (including imaging, microbiology, ancillary and laboratory) are listed below for reference.    Significant Diagnostic Studies: Ct Abdomen Pelvis Wo Contrast  Result Date: 10/14/2018 CLINICAL DATA:  Abdominal pain with nausea and vomiting since yesterday. Polysubstance abuse. History of pancreatitis. EXAM: CT ABDOMEN AND PELVIS WITHOUT CONTRAST TECHNIQUE: Multidetector CT imaging of the abdomen and pelvis was performed following the standard protocol without IV contrast. COMPARISON:  03/18/2017 FINDINGS: Lower chest: Left base airspace disease is new since the prior CT. Normal heart size without pericardial or pleural effusion. Hepatobiliary: Normal liver. Normal gallbladder, without biliary ductal dilatation. Pancreas: Soft tissue fullness throughout the pancreas again identified. Central low-density within the pancreas is likely due to marked pancreatic duct dilatation. This may be slightly progressive compared to 2018. Most apparent the body and tail. Parenchymal calcifications are identified within the neck and head. No border deforming pancreatic mass. Cannot exclude mild peripancreatic edema. Spleen: Normal in size, without focal abnormality. Adrenals/Urinary Tract: Normal adrenal glands. Normal kidneys, without hydronephrosis. No bladder calculi. Stomach/Bowel: Apparent greater curvature gastric wall thickening, including at 3.0 cm on 16/3. Normal terminal ileum. Appendix not visualized. Normal small bowel. Vascular/Lymphatic: Aortic and branch vessel atherosclerosis. No abdominopelvic adenopathy. Reproductive: Normal prostate. Other: No significant free fluid.  No free intraperitoneal air. Musculoskeletal: No acute osseous abnormality. Lumbosacral spondylosis with disc bulges at L3-4 through L5-S1. IMPRESSION: 1. Greater curvature gastric wall thickening, suspicious for gastritis. 2. Findings  of chronic calcific pancreatitis with likely secondary marked duct dilatation in the body and tail. Slightly progressive compared to 2018. Cannot exclude mild peripancreatic edema. Recommend clinical exclusion of acute pancreatitis. 3. Left lower lobe airspace disease, suspicious for infection or aspiration. 4.  Aortic Atherosclerosis (ICD10-I70.0).  This is age advanced. Electronically Signed   By: Abigail Miyamoto M.D.   On: 10/14/2018 16:36    Microbiology: Recent Results (from the past 240 hour(s))  SARS Coronavirus 2 (CEPHEID - Performed in [facility] hospital lab), Hosp Order     Status: None   Collection Time: 10/14/18 12:51  AM  Result Value Ref Range Status   SARS Coronavirus 2 NEGATIVE NEGATIVE Final    Comment: (NOTE) If result is NEGATIVE SARS-CoV-2 target nucleic acids are NOT DETECTED. The SARS-CoV-2 RNA is generally detectable in upper and lower  respiratory specimens during the acute phase of infection. The lowest  concentration of SARS-CoV-2 viral copies this assay can detect is 250  copies / mL. A negative result does not preclude SARS-CoV-2 infection  and should not be used as the sole basis for treatment or other  patient management decisions.  A negative result may occur with  improper specimen collection / handling, submission of specimen other  than nasopharyngeal swab, presence of viral mutation(s) within the  areas targeted by this assay, and inadequate number of viral copies  (<250 copies / mL). A negative result must be combined with clinical  observations, patient history, and epidemiological information. If result is POSITIVE SARS-CoV-2 target nucleic acids are DETECTED. The SARS-CoV-2 RNA is generally detectable in upper and lower  respiratory specimens dur ing the acute phase of infection.  Positive  results are indicative of active infection with SARS-CoV-2.  Clinical  correlation with patient history and other diagnostic information is  necessary to  determine patient infection status.  Positive results do  not rule out bacterial infection or co-infection with other viruses. If result is PRESUMPTIVE POSTIVE SARS-CoV-2 nucleic acids MAY BE PRESENT.   A presumptive positive result was obtained on the submitted specimen  and confirmed on repeat testing.  While 2019 novel coronavirus  (SARS-CoV-2) nucleic acids may be present in the submitted sample  additional confirmatory testing may be necessary for epidemiological  and / or clinical management purposes  to differentiate between  SARS-CoV-2 and other Sarbecovirus currently known to infect humans.  If clinically indicated additional testing with an alternate test  methodology [phone]) is advised. The SARS-CoV-2 RNA is generally  detectable in upper and lower respiratory sp ecimens during the acute  phase of infection. The expected result is Negative. Fact Sheet for Patients:  StrictlyIdeas.no Fact Sheet for Healthcare Providers: BankingDealers.co.za This test is not yet approved or cleared by the [place] FDA and has been authorized for detection and/or diagnosis of SARS-CoV-2 by FDA under an Emergency Use Authorization (EUA).  This EUA will remain in effect (meaning this test can be used) for the duration of the COVID-19 declaration under Section 564(b)(1) of the Act, 21 U.S.C. section 360bbb-3(b)(1), unless the authorization is terminated or revoked sooner. Performed at [facility], [place] [address], [place], [place] [address]      Labs: CBC: Recent Labs  Lab 10/13/18 2141 10/14/18 0302 10/14/18 0853 10/15/18 1118  WBC 8.6 9.6 7.6 11.4*  NEUTROABS 5.4  --   --   --   HGB 11.6* 11.9* 11.6* 9.7*  HCT 33.1* 33.3* 33.4* 28.7*  MCV 93.5 93.3 94.4 96.0  PLT 227 228 223 852   Basic Metabolic Panel: Recent Labs  Lab 10/13/18 2022 10/14/18 0302 10/15/18 1118  NA 137 137 136  K 3.0* 3.8 3.8  CL 107 102 103  CO2  19* 19* 25  GLUCOSE 79 81 120*  BUN 19 19 <5*  CREATININE 1.49* 1.43* 0.81  CALCIUM 6.7* 8.6* 8.5*   Liver Function Tests: Recent Labs  Lab 10/13/18 2022 10/14/18 0302  AST 42* 48*  ALT 39 50*  ALKPHOS 71 103  BILITOT 0.8 1.4*  PROT 5.7* 7.1  ALBUMIN 3.2* 4.1   Recent Labs  Lab 10/13/18  2022  LIPASE 26   No results for input(s): AMMONIA in the last 168 hours. Cardiac Enzymes: Recent Labs  Lab 10/13/18 2022  TROPONINI <0.03   BNP (last 3 results) No results for input(s): BNP in the last 8760 hours. CBG: No results for input(s): GLUCAP in the last 168 hours. Time spent: 35 minutes  Signed:  Berle Mull  [facility] 10/15/2018

## 2018-10-15 NOTE — TOC Transition Note (Signed)
Transition of Care [facility]) - CM/SW Discharge Note Marvetta Gibbons RN,BSN Transitions of Care Cross Coverage for 3E - RN Case Manager [phone]   Patient Details  Name: Renato Spellman MRN: [id number] Date of Birth: 08-14-1970  Transition of Care [facility]) CM/SW Contact:  Dawayne Patricia, RN Phone Number: 10/15/2018, 1:20 PM   Clinical Narrative:    Pt admitted with hematemesis, stable for transition home, states that he needs assistance with medications. CM will assist with [facility] program and override copays as pt reports that he is unable to afford $3 cost per script. Pt follows for primary care at the [facility] with Marliss Coots- call made and msg left that pt will need f/u appointment. Pt will get future meds from [facility] clinic medication assistance.  [facility] pharmacy will fill meds and deliver to bedside prior to pt leaving. CSW to see pt for rehab resources for substance abuse per pt request.    Final next level of care: Home/Self Care Barriers to Discharge: No Barriers Identified   Patient Goals and CMS Choice Patient states their goals for this hospitalization and ongoing recovery are:: home CMS Medicare.gov Compare Post Acute Care list provided to:: Patient Choice offered to / list presented to : NA  Discharge Placement  home                     Discharge Plan and Services In-house Referral: Clinical Social Work Discharge Planning Services: CM Consult, [place] Program, Medication Assistance Post Acute Care Choice: NA          DME Arranged: N/A DME Agency: NA       HH Arranged: NA HH Agency: NA        Social Determinants of Health (SDOH) Interventions     Readmission Risk Interventions Readmission Risk Prevention Plan 10/15/2018  Transportation Screening Complete  Medication Review Press photographer) Complete  PCP or Specialist appointment within 3-5 days of discharge Complete  HRI or [place] Complete  SW Recovery Care/Counseling Consult Complete   [place] Not Applicable  Some recent data might be hidden

## 2018-10-15 NOTE — Discharge Instructions (Signed)
Chronic Pancreatitis  Chronic pancreatitis is long-lasting inflammation and scarring of the pancreas. The pancreas is a gland that is located behind the stomach. It makes enzymes that help to digest food. The pancreas also releases hormones called glucagon and insulin, which help regulate blood sugar (glucose). Damage to the pancreas may affect digestion, cause pain in the upper abdomen and back, and cause diabetes. Inflammation can also irritate other organs in the abdomen near the pancreas. At first, pancreatitis may be sudden (acute). If you have several or prolonged episodes of acute pancreatitis, the condition can turn into chronic pancreatitis. What are the causes? The most common cause of this condition is alcohol abuse. Other causes include:  Follow these instructions at home: Eating and drinking      Do not drink alcohol. If you need help quitting, ask your health care provider.  Follow a diet as told by your health care provider or dietitian, if this applies. This may include: ? Limiting how much fat you eat. ? Eating smaller meals more often. ? Avoiding caffeine.  Drink enough fluid to keep your urine pale yellow. General instructions  Take over-the-counter and prescription medicines only as told by your health care provider. These include vitamin supplements.  Do not drive or use heavy machinery while taking prescription pain medicine.  If you are taking prescription pain medicine, take actions to prevent or treat constipation. Your health care provider may recommend that you: ? Take an over-the-counter or prescription medicine for constipation. ? Eat foods that are high in fiber such as whole grains and beans. ? Limit foods that are high in fat and processed sugars, such as fried or sweet foods.  Do not use any products that contain nicotine or tobacco, such as cigarettes and e-cigarettes. If you need help quitting, ask your health care provider.  If recommended by your  health care provider, monitor your blood glucose at home.  Keep all follow-up visits as told by your health care provider. This is important. Contact a health care provider if:  You have pain that does not get better with medicine.  You have a fever.  You have sudden weight loss. Get help right away if:  Your pain suddenly gets worse.  You have sudden swelling in your abdomen.  You start to vomit often.  You vomit blood.  You have diarrhea that does not go away.  You have blood in your stool.  You become confused or you have trouble thinking clearly. Summary  Chronic pancreatitis is long-lasting inflammation and scarring of the pancreas. Damage to the pancreas may affect digestion, cause pain in the upper abdomen and back, and cause diabetes. Inflammation can also irritate other organs in the abdomen near the pancreas.  Common causes of this condition are alcohol abuse, gallstones, high (elevated) levels of triglycerides, and certain medicines.  This condition is sometimes treated at a hospital and may involve resting the pancreas, controlling pain, replacing enzymes, and avoiding alcohol. This information is not intended to replace advice given to you by your health care provider. Make sure you discuss any questions you have with your health care provider. Document Released: 05/22/2015 Document Revised: 12/23/2016 Document Reviewed: 12/23/2016 Elsevier Interactive Patient Education  2019 [employer].

## 2018-10-16 LAB — GASTRIN: Gastrin: 92 pg/mL (ref 0–115)

## 2018-11-13 ENCOUNTER — Other Ambulatory Visit: Payer: Self-pay

## 2018-11-13 ENCOUNTER — Encounter (HOSPITAL_COMMUNITY): Payer: Self-pay

## 2018-11-13 ENCOUNTER — Observation Stay (HOSPITAL_COMMUNITY): Payer: Self-pay

## 2018-11-13 ENCOUNTER — Observation Stay (HOSPITAL_COMMUNITY)
Admission: EM | Admit: 2018-11-13 | Discharge: 2018-11-14 | Disposition: A | Discharge status: Home / Self Care | Payer: Self-pay | Attending: Internal Medicine | Admitting: Internal Medicine

## 2018-11-13 ENCOUNTER — Emergency Department (HOSPITAL_COMMUNITY): Payer: Self-pay

## 2018-11-13 DIAGNOSIS — F17200 Nicotine dependence, unspecified, uncomplicated: Secondary | ICD-10-CM

## 2018-11-13 DIAGNOSIS — F1910 Other psychoactive substance abuse, uncomplicated: Secondary | ICD-10-CM | POA: Diagnosis present

## 2018-11-13 DIAGNOSIS — R0789 Other chest pain: Secondary | ICD-10-CM

## 2018-11-13 DIAGNOSIS — Y900 Blood alcohol level of less than 20 mg/100 ml: Secondary | ICD-10-CM | POA: Insufficient documentation

## 2018-11-13 DIAGNOSIS — I10 Essential (primary) hypertension: Secondary | ICD-10-CM | POA: Diagnosis present

## 2018-11-13 DIAGNOSIS — F319 Bipolar disorder, unspecified: Secondary | ICD-10-CM | POA: Insufficient documentation

## 2018-11-13 DIAGNOSIS — K920 Hematemesis: Principal | ICD-10-CM | POA: Insufficient documentation

## 2018-11-13 DIAGNOSIS — G8929 Other chronic pain: Secondary | ICD-10-CM | POA: Insufficient documentation

## 2018-11-13 DIAGNOSIS — J449 Chronic obstructive pulmonary disease, unspecified: Secondary | ICD-10-CM | POA: Insufficient documentation

## 2018-11-13 DIAGNOSIS — Z79899 Other long term (current) drug therapy: Secondary | ICD-10-CM | POA: Insufficient documentation

## 2018-11-13 DIAGNOSIS — F1020 Alcohol dependence, uncomplicated: Secondary | ICD-10-CM | POA: Diagnosis present

## 2018-11-13 DIAGNOSIS — M545 Low back pain: Secondary | ICD-10-CM | POA: Insufficient documentation

## 2018-11-13 DIAGNOSIS — F1210 Cannabis abuse, uncomplicated: Secondary | ICD-10-CM | POA: Insufficient documentation

## 2018-11-13 DIAGNOSIS — I129 Hypertensive chronic kidney disease with stage 1 through stage 4 chronic kidney disease, or unspecified chronic kidney disease: Secondary | ICD-10-CM | POA: Insufficient documentation

## 2018-11-13 DIAGNOSIS — Z20828 Contact with and (suspected) exposure to other viral communicable diseases: Secondary | ICD-10-CM | POA: Insufficient documentation

## 2018-11-13 DIAGNOSIS — F1410 Cocaine abuse, uncomplicated: Secondary | ICD-10-CM | POA: Insufficient documentation

## 2018-11-13 DIAGNOSIS — F332 Major depressive disorder, recurrent severe without psychotic features: Secondary | ICD-10-CM | POA: Diagnosis present

## 2018-11-13 DIAGNOSIS — R1013 Epigastric pain: Secondary | ICD-10-CM

## 2018-11-13 DIAGNOSIS — D72829 Elevated white blood cell count, unspecified: Secondary | ICD-10-CM | POA: Diagnosis present

## 2018-11-13 DIAGNOSIS — F10239 Alcohol dependence with withdrawal, unspecified: Secondary | ICD-10-CM | POA: Insufficient documentation

## 2018-11-13 DIAGNOSIS — F4310 Post-traumatic stress disorder, unspecified: Secondary | ICD-10-CM | POA: Insufficient documentation

## 2018-11-13 DIAGNOSIS — N183 Chronic kidney disease, stage 3 (moderate): Secondary | ICD-10-CM | POA: Insufficient documentation

## 2018-11-13 DIAGNOSIS — K861 Other chronic pancreatitis: Secondary | ICD-10-CM | POA: Diagnosis present

## 2018-11-13 DIAGNOSIS — R197 Diarrhea, unspecified: Secondary | ICD-10-CM | POA: Insufficient documentation

## 2018-11-13 DIAGNOSIS — D649 Anemia, unspecified: Secondary | ICD-10-CM | POA: Insufficient documentation

## 2018-11-13 DIAGNOSIS — N179 Acute kidney failure, unspecified: Secondary | ICD-10-CM

## 2018-11-13 DIAGNOSIS — R569 Unspecified convulsions: Secondary | ICD-10-CM

## 2018-11-13 DIAGNOSIS — F17210 Nicotine dependence, cigarettes, uncomplicated: Secondary | ICD-10-CM | POA: Insufficient documentation

## 2018-11-13 DIAGNOSIS — Z8711 Personal history of peptic ulcer disease: Secondary | ICD-10-CM | POA: Insufficient documentation

## 2018-11-13 DIAGNOSIS — Z720 Tobacco use: Secondary | ICD-10-CM | POA: Diagnosis present

## 2018-11-13 DIAGNOSIS — K860 Alcohol-induced chronic pancreatitis: Secondary | ICD-10-CM | POA: Insufficient documentation

## 2018-11-13 DIAGNOSIS — I456 Pre-excitation syndrome: Secondary | ICD-10-CM | POA: Diagnosis present

## 2018-11-13 DIAGNOSIS — R1012 Left upper quadrant pain: Secondary | ICD-10-CM | POA: Insufficient documentation

## 2018-11-13 DIAGNOSIS — F419 Anxiety disorder, unspecified: Secondary | ICD-10-CM | POA: Insufficient documentation

## 2018-11-13 DIAGNOSIS — K219 Gastro-esophageal reflux disease without esophagitis: Secondary | ICD-10-CM | POA: Insufficient documentation

## 2018-11-13 DIAGNOSIS — E162 Hypoglycemia, unspecified: Secondary | ICD-10-CM | POA: Insufficient documentation

## 2018-11-13 DIAGNOSIS — R109 Unspecified abdominal pain: Secondary | ICD-10-CM | POA: Diagnosis present

## 2018-11-13 DIAGNOSIS — Z79891 Long term (current) use of opiate analgesic: Secondary | ICD-10-CM | POA: Insufficient documentation

## 2018-11-13 DIAGNOSIS — D573 Sickle-cell trait: Secondary | ICD-10-CM | POA: Insufficient documentation

## 2018-11-13 LAB — LIPID PANEL
Cholesterol: 176 mg/dL (ref 0–200)
HDL: 77 mg/dL (ref >40)
LDL Cholesterol: 72 mg/dL (ref 0–99)
Total CHOL/HDL Ratio: 2.3 RATIO
Triglycerides: 134 mg/dL (ref <150)
VLDL: 27 mg/dL (ref 0–40)

## 2018-11-13 LAB — SARS CORONAVIRUS 2 BY RT PCR (HOSPITAL ORDER, PERFORMED IN ~~LOC~~ HOSPITAL LAB): SARS Coronavirus 2: NEGATIVE

## 2018-11-13 LAB — CBC
HCT: 39.3 % (ref 39.0–52.0)
Hemoglobin: 12.5 g/dL — ABNORMAL LOW (ref 13.0–17.0)
MCH: 31.7 pg (ref 26.0–34.0)
MCHC: 31.8 g/dL (ref 30.0–36.0)
MCV: 99.7 fL (ref 80.0–100.0)
Platelets: 166 10*3/uL (ref 150–400)
RBC: 3.94 MIL/uL — ABNORMAL LOW (ref 4.22–5.81)
RDW: 13.8 % (ref 11.5–15.5)
WBC: 12.2 10*3/uL — ABNORMAL HIGH (ref 4.0–10.5)
nRBC: 0 % (ref 0.0–0.2)

## 2018-11-13 LAB — HEPATIC FUNCTION PANEL
ALT: 40 U/L (ref 0–44)
AST: 56 U/L — ABNORMAL HIGH (ref 15–41)
Albumin: 4.6 g/dL (ref 3.5–5.0)
Alkaline Phosphatase: 111 U/L (ref 38–126)
Bilirubin, Direct: 0.2 mg/dL (ref 0.0–0.2)
Indirect Bilirubin: 0.9 mg/dL (ref 0.3–0.9)
Total Bilirubin: 1.1 mg/dL (ref 0.3–1.2)
Total Protein: 8.8 g/dL — ABNORMAL HIGH (ref 6.5–8.1)

## 2018-11-13 LAB — BASIC METABOLIC PANEL
Anion gap: 15 (ref 5–15)
Anion gap: 12 (ref 5–15)
BUN: 16 mg/dL (ref 6–20)
BUN: 16 mg/dL (ref 6–20)
CO2: 18 mmol/L — ABNORMAL LOW (ref 22–32)
CO2: 19 mmol/L — ABNORMAL LOW (ref 22–32)
Calcium: 8.6 mg/dL — ABNORMAL LOW (ref 8.9–10.3)
Calcium: 7.6 mg/dL — ABNORMAL LOW (ref 8.9–10.3)
Chloride: 99 mmol/L (ref 98–111)
Chloride: 103 mmol/L (ref 98–111)
Creatinine, Ser: 2.19 mg/dL — ABNORMAL HIGH (ref 0.61–1.24)
Creatinine, Ser: 1.42 mg/dL — ABNORMAL HIGH (ref 0.61–1.24)
GFR calc Af Amer: 40 mL/min — ABNORMAL LOW (ref >60)
GFR calc Af Amer: >60 mL/min (ref >60)
GFR calc non Af Amer: 34 mL/min — ABNORMAL LOW (ref >60)
GFR calc non Af Amer: 58 mL/min — ABNORMAL LOW (ref >60)
Glucose, Bld: 88 mg/dL (ref 70–99)
Glucose, Bld: 75 mg/dL (ref 70–99)
Potassium: 3.8 mmol/L (ref 3.5–5.1)
Potassium: 3.5 mmol/L (ref 3.5–5.1)
Sodium: 132 mmol/L — ABNORMAL LOW (ref 135–145)
Sodium: 134 mmol/L — ABNORMAL LOW (ref 135–145)

## 2018-11-13 LAB — TYPE AND SCREEN
ABO/RH(D): B POS
Antibody Screen: NEGATIVE

## 2018-11-13 LAB — ETHANOL: Alcohol, Ethyl (B): <10 mg/dL (ref <10)

## 2018-11-13 LAB — RAPID URINE DRUG SCREEN, HOSP PERFORMED
Amphetamines: NOT DETECTED
Barbiturates: NOT DETECTED
Benzodiazepines: NOT DETECTED
Cocaine: POSITIVE — AB
Opiates: NOT DETECTED
Tetrahydrocannabinol: NOT DETECTED

## 2018-11-13 LAB — HEMOGLOBIN A1C
Hgb A1c MFr Bld: 4.6 % — ABNORMAL LOW (ref 4.8–5.6)
Mean Plasma Glucose: 85.32 mg/dL

## 2018-11-13 LAB — HEMOGLOBIN AND HEMATOCRIT, BLOOD
HCT: 34.6 % — ABNORMAL LOW (ref 39.0–52.0)
Hemoglobin: 10.9 g/dL — ABNORMAL LOW (ref 13.0–17.0)

## 2018-11-13 LAB — ABO/RH: ABO/RH(D): B POS

## 2018-11-13 LAB — TROPONIN I (HIGH SENSITIVITY)
Troponin I (High Sensitivity): 5 ng/L (ref <18)
Troponin I (High Sensitivity): 6 ng/L (ref <18)
Troponin I (High Sensitivity): 4 ng/L (ref <18)
Troponin I (High Sensitivity): 3 ng/L (ref <18)

## 2018-11-13 LAB — APTT: aPTT: 30 seconds (ref 24–36)

## 2018-11-13 LAB — PROTIME-INR
INR: 1.1 (ref 0.8–1.2)
Prothrombin Time: 13.6 seconds (ref 11.4–15.2)

## 2018-11-13 LAB — LIPASE, BLOOD: Lipase: 67 U/L — ABNORMAL HIGH (ref 11–51)

## 2018-11-13 MED ORDER — ACETAMINOPHEN 325 MG PO TABS: 650.0000 mg | ORAL_TABLET | ORAL | Status: DC | PRN

## 2018-11-13 MED ORDER — NICOTINE 21 MG/24HR TD PT24
21.0000 mg | MEDICATED_PATCH | Freq: Every day | TRANSDERMAL | Status: DC
  Administered 2018-11-13 – 2018-11-14 (×2): 21 mg via TRANSDERMAL
  Filled 2018-11-13 (×2): qty 1

## 2018-11-13 MED ORDER — OXYCODONE-ACETAMINOPHEN 5-325 MG PO TABS
1.0000 | ORAL_TABLET | ORAL | Status: DC | PRN
  Administered 2018-11-13 – 2018-11-14 (×5): 1 via ORAL
  Filled 2018-11-13 (×5): qty 1

## 2018-11-13 MED ORDER — LORAZEPAM 0.5 MG PO TABS
0.5000 mg | ORAL_TABLET | Freq: Three times a day (TID) | ORAL | Status: DC | PRN
  Administered 2018-11-13: 0.5 mg via ORAL
  Filled 2018-11-13: qty 1

## 2018-11-13 MED ORDER — SUCRALFATE 1 GM/10ML PO SUSP
1.0000 g | Freq: Three times a day (TID) | ORAL | Status: DC
  Administered 2018-11-13 (×3): 1 g via ORAL
  Filled 2018-11-13 (×4): qty 10

## 2018-11-13 MED ORDER — LORATADINE 10 MG PO TABS
10.0000 mg | ORAL_TABLET | Freq: Every day | ORAL | Status: DC
  Administered 2018-11-13 – 2018-11-14 (×2): 10 mg via ORAL
  Filled 2018-11-13 (×2): qty 1

## 2018-11-13 MED ORDER — LORAZEPAM 2 MG/ML IJ SOLN
1.0000 mg | INTRAMUSCULAR | Status: DC | PRN
  Administered 2018-11-13: 1 mg via INTRAVENOUS
  Filled 2018-11-13: qty 1

## 2018-11-13 MED ORDER — PANCRELIPASE (LIP-PROT-AMYL) 12000-38000 UNITS PO CPEP
12000.0000 [IU] | ORAL_CAPSULE | Freq: Three times a day (TID) | ORAL | Status: DC
  Administered 2018-11-13 (×2): 12000 [IU] via ORAL
  Filled 2018-11-13 (×2): qty 1

## 2018-11-13 MED ORDER — ALUM & MAG HYDROXIDE-SIMETH 200-200-20 MG/5ML PO SUSP
30.0000 mL | Freq: Once | ORAL | Status: AC
  Administered 2018-11-13: 30 mL via ORAL
  Filled 2018-11-13: qty 30

## 2018-11-13 MED ORDER — SODIUM CHLORIDE 0.9% FLUSH
3.0000 mL | Freq: Once | INTRAVENOUS | Status: AC
  Administered 2018-11-13: 3 mL via INTRAVENOUS

## 2018-11-13 MED ORDER — ONDANSETRON HCL 4 MG/2ML IJ SOLN: 4.0000 mg | Freq: Four times a day (QID) | INTRAMUSCULAR | Status: DC | PRN

## 2018-11-13 MED ORDER — LORAZEPAM 2 MG/ML IJ SOLN
1.0000 mg | Freq: Four times a day (QID) | INTRAMUSCULAR | Status: DC | PRN
  Administered 2018-11-13: 1 mg via INTRAVENOUS
  Filled 2018-11-13: qty 1

## 2018-11-13 MED ORDER — VITAMIN B-12 1000 MCG PO TABS
1000.0000 ug | ORAL_TABLET | Freq: Every day | ORAL | Status: DC
  Administered 2018-11-13 – 2018-11-14 (×2): 1000 ug via ORAL
  Filled 2018-11-13 (×2): qty 1

## 2018-11-13 MED ORDER — FOLIC ACID 1 MG PO TABS
1.0000 mg | ORAL_TABLET | Freq: Every day | ORAL | Status: DC
  Administered 2018-11-13 – 2018-11-14 (×2): 1 mg via ORAL
  Filled 2018-11-13 (×2): qty 1

## 2018-11-13 MED ORDER — FAMOTIDINE IN NACL 20-0.9 MG/50ML-% IV SOLN
20.0000 mg | INTRAVENOUS | Status: AC
  Administered 2018-11-13: 20 mg via INTRAVENOUS
  Filled 2018-11-13: qty 50

## 2018-11-13 MED ORDER — HYDRALAZINE HCL 25 MG PO TABS
25.0000 mg | ORAL_TABLET | Freq: Every day | ORAL | Status: DC
  Administered 2018-11-13 – 2018-11-14 (×2): 25 mg via ORAL
  Filled 2018-11-13 (×2): qty 1

## 2018-11-13 MED ORDER — QUETIAPINE FUMARATE 100 MG PO TABS
100.0000 mg | ORAL_TABLET | Freq: Every day | ORAL | Status: DC
  Administered 2018-11-13: 100 mg via ORAL
  Filled 2018-11-13: qty 1

## 2018-11-13 MED ORDER — FOLIC ACID 1 MG PO TABS: 1.0000 mg | ORAL_TABLET | Freq: Every day | ORAL | Status: DC

## 2018-11-13 MED ORDER — LIDOCAINE VISCOUS HCL 2 % MT SOLN
15.0000 mL | Freq: Once | OROMUCOSAL | Status: AC
  Administered 2018-11-13: 15 mL via ORAL
  Filled 2018-11-13: qty 15

## 2018-11-13 MED ORDER — VITAMIN B-1 100 MG PO TABS
100.0000 mg | ORAL_TABLET | Freq: Every day | ORAL | Status: DC
  Administered 2018-11-13 – 2018-11-14 (×2): 100 mg via ORAL
  Filled 2018-11-13 (×2): qty 1

## 2018-11-13 MED ORDER — SERTRALINE HCL 50 MG PO TABS
150.0000 mg | ORAL_TABLET | Freq: Every day | ORAL | Status: DC
  Administered 2018-11-13 – 2018-11-14 (×2): 150 mg via ORAL
  Filled 2018-11-13 (×2): qty 1

## 2018-11-13 MED ORDER — AMITRIPTYLINE HCL 25 MG PO TABS
25.0000 mg | ORAL_TABLET | Freq: Every day | ORAL | Status: DC
  Administered 2018-11-13: 25 mg via ORAL
  Filled 2018-11-13: qty 1

## 2018-11-13 MED ORDER — METOPROLOL TARTRATE 50 MG PO TABS
100.0000 mg | ORAL_TABLET | Freq: Every day | ORAL | Status: DC
  Administered 2018-11-13 – 2018-11-14 (×2): 100 mg via ORAL
  Filled 2018-11-13 (×2): qty 2

## 2018-11-13 MED ORDER — PANTOPRAZOLE SODIUM 40 MG PO TBEC
40.0000 mg | DELAYED_RELEASE_TABLET | Freq: Two times a day (BID) | ORAL | Status: DC
  Administered 2018-11-13: 40 mg via ORAL
  Filled 2018-11-13 (×2): qty 1

## 2018-11-13 MED ORDER — LORAZEPAM 2 MG/ML IJ SOLN
0.0000 mg | Freq: Four times a day (QID) | INTRAMUSCULAR | Status: DC
  Administered 2018-11-13 – 2018-11-14 (×3): 2 mg via INTRAVENOUS
  Filled 2018-11-13 (×3): qty 1

## 2018-11-13 MED ORDER — SODIUM CHLORIDE 0.9 % IV SOLN
INTRAVENOUS | Status: DC
  Administered 2018-11-13 – 2018-11-14 (×4): via INTRAVENOUS

## 2018-11-13 MED ORDER — HYDRALAZINE HCL 20 MG/ML IJ SOLN: 5.0000 mg | INTRAMUSCULAR | Status: DC | PRN

## 2018-11-13 MED ORDER — SODIUM CHLORIDE 0.9 % IV BOLUS (SEPSIS)
1000.0000 mL | Freq: Once | INTRAVENOUS | Status: AC
  Administered 2018-11-13: 1000 mL via INTRAVENOUS

## 2018-11-13 MED ORDER — LORAZEPAM 1 MG PO TABS: 1.0000 mg | ORAL_TABLET | Freq: Four times a day (QID) | ORAL | Status: DC | PRN

## 2018-11-13 MED ORDER — LORAZEPAM 2 MG/ML IJ SOLN: 0.0000 mg | Freq: Two times a day (BID) | INTRAMUSCULAR | Status: DC

## 2018-11-13 MED ORDER — VITAMIN B-1 100 MG PO TABS: 100.0000 mg | ORAL_TABLET | Freq: Every day | ORAL | Status: DC

## 2018-11-13 MED ORDER — ADULT MULTIVITAMIN W/MINERALS CH
1.0000 | ORAL_TABLET | Freq: Every day | ORAL | Status: DC
  Filled 2018-11-13: qty 1

## 2018-11-13 MED ORDER — ADULT MULTIVITAMIN W/MINERALS CH
1.0000 | ORAL_TABLET | Freq: Every day | ORAL | Status: DC
  Administered 2018-11-13 – 2018-11-14 (×2): 1 via ORAL
  Filled 2018-11-13: qty 1

## 2018-11-13 MED ORDER — THIAMINE HCL 100 MG/ML IJ SOLN: 100.0000 mg | Freq: Every day | INTRAMUSCULAR | Status: DC

## 2018-11-13 NOTE — ED Notes (Signed)
ED TO INPATIENT HANDOFF REPORT  Name/Age/Gender [name] 48 y.o. male  Code Status Code Status History    Date Active Date Inactive Code Status Order ID Comments User Context   10/14/2018 0209 10/15/2018 1740 Full Code [id number]  Elwyn Reach, MD Inpatient   01/08/2018 0927 01/09/2018 2200 Full Code [id number]  Kathi Ludwig, MD ED   11/24/2017 0146 11/25/2017 2205 Full Code [id number]  Norval Morton, MD ED   09/28/2017 2339 10/01/2017 1647 Full Code [id number]  Bethena Roys, MD Inpatient   09/04/2017 1653 09/06/2017 1305 Full Code [id number]  Karmen Bongo, MD ED   06/11/2017 0423 06/11/2017 1804 Full Code [id number]  Palumbo, April, MD ED   05/27/2017 1943 05/30/2017 1557 Full Code [id number]  Bonnell Public, MD ED   03/18/2017 2241 03/20/2017 1547 Full Code [id number]  Jani Gravel, MD ED   01/15/2017 1643 01/19/2017 1928 Full Code [id number]  Truett Mainland, DO Inpatient   12/17/2016 2044 12/29/2016 1839 Full Code [id number]  Sid Falcon, MD Inpatient   12/05/2016 1617 12/06/2016 2025 Full Code [id number]  Vianne Bulls, MD ED   07/20/2016 1307 07/26/2016 1006 Full Code [id number]  [name], NP Inpatient   07/20/2016 0346 07/20/2016 1212 Full Code [id number]  [name], MD ED   07/19/2016 1506 07/20/2016 0346 Full Code [id number]  Leo Grosser, MD ED   05/18/2015 0346 05/25/2015 1720 Full Code [id number]  Norval Morton, MD Inpatient   03/26/2015 1604 03/30/2015 1504 Full Code [id number]  Samella Parr, NP ED   02/02/2015 1411 02/04/2015 1623 Full Code [id number]  Theodis Blaze, MD Inpatient   12/16/2014 0901 12/18/2014 1639 Full Code [id number]  Cherene Altes, MD Inpatient   03/20/2014 0214 03/21/2014 1907 Full Code [id number]  Theressa Millard, MD Inpatient   03/19/2014 2344 03/20/2014 0214 Full Code [id number]  Delora Fuel, MD ED   03/10/2014 2137 03/13/2014 1646 Full Code [id number]  Berle Mull, MD ED   02/23/2014 1154 02/25/2014 1852 Full Code [id number]   Reyne Dumas, MD ED   02/13/2014 2250 02/14/2014 1445 Full Code [id number]  Norman Herrlich, NP ED   01/30/2014 1332 02/02/2014 1742 Full Code [id number]  Eugenie Filler, MD Inpatient   01/22/2014 0914 01/23/2014 1926 Full Code [id number]  Nita Sells, MD Inpatient   01/09/2014 1418 01/15/2014 2112 Full Code [id number]  Elmarie Shiley, MD Inpatient   12/26/2013 0118 12/31/2013 2015 Full Code [id number]  Rise Patience, MD Inpatient   11/17/2013 2112 11/26/2013 1802 Full Code [id number]  Merton Border, MD Inpatient   11/06/2013 0151 11/09/2013 2117 Full Code [id number]  Laverle Hobby, PA-C Inpatient   11/06/2013 0138 11/06/2013 0151 Full Code [id number]  Earleen Newport, NP Inpatient   11/05/2013 1334 11/06/2013 0138 Full Code [id number]  Illene Labrador, PA-C ED   10/22/2013 2114 10/25/2013 1846 Full Code [id number]  Laverle Hobby, PA-C Inpatient   10/06/2013 0416 10/22/2013 2114 Full Code [id number]  Phillips Grout, MD Inpatient   03/06/2013 1807 03/07/2013 2134 Full Code [id number]  Dixie Dials, MD Inpatient   03/02/2013 1324 03/03/2013 2113 Full Code [id number]  [name] ED   10/03/2012 0056 10/03/2012 2258 Full Code [id number]  Etta Quill., DO ED   07/11/2011 0621 07/11/2011 1106 Full Code [id number]  Threasa Beards, MD ED   Advance Care Planning Activity      Home/SNF/Other Home  Chief Complaint Chest pain; Abdominal Pain  Level of Care/Admitting Diagnosis  ED Disposition    ED Disposition Condition Comment   Admit  Hospital Area: [facility] [[id number]]  Level of Care: Telemetry [5]  Admit to tele based on following criteria: Other see comments  Comments: CP  Covid Evaluation: Asymptomatic Screening Protocol (No Symptoms)  Diagnosis: Atypical chest pain [505397]  Admitting Physician: [name] [[id number]]  Attending Physician: [name] [[id number]]  PT Class (Do Not Modify): Observation [104]  PT Acc Code (Do Not Modify): Observation [10022]       Medical  History Past Medical History:  Diagnosis Date  . Alcoholism /alcohol abuse ([place])   . Anemia   . Anxiety   . Arthritis    "knees; arms; elbows" (03/26/2015)  . Asthma   . Bipolar disorder ([place])   . Chronic bronchitis ([place])   . Chronic lower back pain   . Chronic pancreatitis ([place])   . Cocaine abuse ([place])   . Depression   . Family history of adverse reaction to anesthesia    "grandmother gets confused"  . Femoral condyle fracture ([place]) 03/08/2014   left medial/notes 03/09/2014  . GERD (gastroesophageal reflux disease)   . H/O hiatal hernia   . H/O suicide attempt 10/2012  . Heart murmur    "when he was little" (03/06/2013)  . High cholesterol   . History of blood transfusion 10/2012   "when I tried to commit suicide"  . History of stomach ulcers   . Hypertension   . Marijuana abuse, continuous   . Migraine    "a few times/year" (03/26/2015)  . Pneumonia 1990's X 3  . PTSD (post-traumatic stress disorder)   . Shortness of breath    "can happen at anytime" (03/06/2013)  . Sickle cell trait ([place])   . WPW (Wolff-Parkinson-White syndrome)    [name] 03/06/2013    Allergies Allergies  Allergen Reactions  . Robaxin [Methocarbamol] Other (See Comments)    "jumpy limbs"  . Shellfish-Derived Products Nausea And Vomiting  . Trazodone Other (See Comments)    Muscle spasms  . Trazodone And Nefazodone Other (See Comments)    Muscle spasms  . Adhesive [Tape] Itching  . Latex Itching  . Toradol [Ketorolac Tromethamine] Other (See Comments)    Has ulcers; cannot have this  . Contrast Media [Iodinated Diagnostic Agents] Hives  . Reglan [Metoclopramide] Other (See Comments)    Muscle spasms    IV Location/Drains/Wounds Patient Lines/Drains/Airways Status   Active Line/Drains/Airways    Name:   Placement date:   Placement time:   Site:   Days:   Peripheral IV 10/14/18 Right;Posterior Forearm   10/14/18    1840    Forearm   30   Peripheral IV 11/13/18 Left Forearm   11/13/18     0318    Forearm   less than 1          Labs/Imaging Results for orders placed or performed during the hospital encounter of 11/13/18 (from the past 48 hour(s))  Basic metabolic panel     Status: Abnormal   Collection Time: 11/13/18  1:30 AM  Result Value Ref Range   Sodium 132 (L) 135 - 145 mmol/L   Potassium 3.8 3.5 - 5.1 mmol/L   Chloride 99 98 - 111 mmol/L   CO2 18 (L) 22 - 32 mmol/L   Glucose, Bld 88 70 - 99 mg/dL   BUN 16 6 - 20 mg/dL   Creatinine, Ser 2.19 (H) 0.61 - 1.24 mg/dL   Calcium 8.6 (L) 8.9 - 10.3  mg/dL   GFR calc non Af Amer 34 (L) >60 mL/min   GFR calc Af Amer 40 (L) >60 mL/min   Anion gap 15 5 - 15    Comment: Performed at [facility], [place] [address] [place], [place] [address]  CBC     Status: Abnormal   Collection Time: 11/13/18  1:30 AM  Result Value Ref Range   WBC 12.2 (H) 4.0 - 10.5 K/uL   RBC 3.94 (L) 4.22 - 5.81 MIL/uL   Hemoglobin 12.5 (L) 13.0 - 17.0 g/dL   HCT 39.3 39.0 - 52.0 %   MCV 99.7 80.0 - 100.0 fL   MCH 31.7 26.0 - 34.0 pg   MCHC 31.8 30.0 - 36.0 g/dL   RDW 13.8 11.5 - 15.5 %   Platelets 166 150 - 400 K/uL   nRBC 0.0 0.0 - 0.2 %    Comment: Performed at [facility], [place] [address], [place], [place] [address]  Troponin I (High Sensitivity)     Status: None   Collection Time: 11/13/18  1:30 AM  Result Value Ref Range   Troponin I (High Sensitivity) 5 <18 ng/L    Comment: (NOTE) Elevated high sensitivity troponin I (hsTnI) values and significant  changes across serial measurements may suggest ACS but many other  chronic and acute conditions are known to elevate hsTnI results.  Refer to the "Links" section for chest pain algorithms and additional  guidance. Performed at [facility], [place] [address] [place], [place] [address]   Hepatic function panel     Status: Abnormal   Collection Time: 11/13/18  1:30 AM  Result Value Ref Range   Total Protein 8.8 (H) 6.5 - 8.1 g/dL    Albumin 4.6 3.5 - 5.0 g/dL   AST 56 (H) 15 - 41 U/L   ALT 40 0 - 44 U/L   Alkaline Phosphatase 111 38 - 126 U/L   Total Bilirubin 1.1 0.3 - 1.2 mg/dL   Bilirubin, Direct 0.2 0.0 - 0.2 mg/dL   Indirect Bilirubin 0.9 0.3 - 0.9 mg/dL    Comment: Performed at [facility], [place] [name] [place], [place] [address]  Lipase, blood     Status: Abnormal   Collection Time: 11/13/18  1:30 AM  Result Value Ref Range   Lipase 67 (H) 11 - 51 U/L    Comment: Performed at [facility], [place] [address] [place], [place] [address]  Ethanol     Status: None   Collection Time: 11/13/18  1:30 AM  Result Value Ref Range   Alcohol, Ethyl (B) <10 <10 mg/dL    Comment: (NOTE) Lowest detectable limit for serum alcohol is 10 mg/dL. For medical purposes only. Performed at [facility], [place] [address], [place], [place] [address]   Troponin I (High Sensitivity)     Status: None   Collection Time: 11/13/18  3:02 AM  Result Value Ref Range   Troponin I (High Sensitivity) 6.0 <18 ng/L    Comment: (NOTE) Elevated high sensitivity troponin I (hsTnI) values and significant  changes across serial measurements may suggest ACS but many other  chronic and acute conditions are known to elevate hsTnI results.  Refer to the "Links" section for chest pain algorithms and additional  guidance. Performed at [facility], [place] [address], [place], [place] [address]   SARS Coronavirus 2 (CEPHEID - Performed in [facility] hospital lab), [facility]  Status: None   Collection Time: 11/13/18  3:02 AM   Specimen: Nasopharyngeal Swab  Result Value Ref Range   SARS Coronavirus 2 NEGATIVE NEGATIVE    Comment: (NOTE) If result is NEGATIVE SARS-CoV-2 target nucleic acids are NOT DETECTED. The SARS-CoV-2 RNA is generally detectable in upper and lower  respiratory specimens during the acute phase of infection. The lowest  concentration of SARS-CoV-2  viral copies this assay can detect is 250  copies / mL. A negative result does not preclude SARS-CoV-2 infection  and should not be used as the sole basis for treatment or other  patient management decisions.  A negative result may occur with  improper specimen collection / handling, submission of specimen other  than nasopharyngeal swab, presence of viral mutation(s) within the  areas targeted by this assay, and inadequate number of viral copies  (<250 copies / mL). A negative result must be combined with clinical  observations, patient history, and epidemiological information. If result is POSITIVE SARS-CoV-2 target nucleic acids are DETECTED. The SARS-CoV-2 RNA is generally detectable in upper and lower  respiratory specimens dur ing the acute phase of infection.  Positive  results are indicative of active infection with SARS-CoV-2.  Clinical  correlation with patient history and other diagnostic information is  necessary to determine patient infection status.  Positive results do  not rule out bacterial infection or co-infection with other viruses. If result is PRESUMPTIVE POSTIVE SARS-CoV-2 nucleic acids MAY BE PRESENT.   A presumptive positive result was obtained on the submitted specimen  and confirmed on repeat testing.  While 2019 novel coronavirus  (SARS-CoV-2) nucleic acids may be present in the submitted sample  additional confirmatory testing may be necessary for epidemiological  and / or clinical management purposes  to differentiate between  SARS-CoV-2 and other Sarbecovirus currently known to infect humans.  If clinically indicated additional testing with an alternate test  methodology [phone]) is advised. The SARS-CoV-2 RNA is generally  detectable in upper and lower respiratory sp ecimens during the acute  phase of infection. The expected result is Negative. Fact Sheet for Patients:  StrictlyIdeas.no Fact Sheet for Healthcare  Providers: BankingDealers.co.za This test is not yet approved or cleared by the [place] FDA and has been authorized for detection and/or diagnosis of SARS-CoV-2 by FDA under an Emergency Use Authorization (EUA).  This EUA will remain in effect (meaning this test can be used) for the duration of the COVID-19 declaration under Section 564(b)(1) of the Act, 21 U.S.C. section 360bbb-3(b)(1), unless the authorization is terminated or revoked sooner. Performed at [facility], [place] [address] [place], [place] [address]    Dg Chest 2 View  Result Date: 11/13/2018 CLINICAL DATA:  Chest pain. EXAM: CHEST - 2 VIEW COMPARISON:  July 09, 2018 FINDINGS: The previously demonstrated right middle lobe pneumonia has substantially improved. There are a few chronic appearing airspace opacities bilaterally. There is no pneumothorax. No large pleural effusion. There is no acute osseous abnormality. The heart size is normal. IMPRESSION: No active cardiopulmonary disease. Electronically Signed   By: Constance Holster M.D.   On: 11/13/2018 01:17    Pending Labs [employer] (From admission, onward)    Start     Ordered   Signed and Held  Urine rapid drug screen (hosp performed)  ONCE - STAT,   R     Signed and Held   Signed and Held  Protime-INR  Once,   [employer] and Held  Signed and Held  APTT  Once,   R     Signed and Held   Signed and Held  Type and screen [place]  Once,   R    Comments: [place]    Signed and Held   Signed and Held  Hemoglobin A1c  Tomorrow morning,   R     Signed and Held   Signed and Held  Lipid panel  Tomorrow morning,   R    Comments: Please obtain as a fasting lipid panel - should not have eaten/ drank food for 8 hours prior to labs.    Signed and Held   Signed and Held  Troponin I (High Sensitivity)  STAT Now then every 2 hours,   R    Question:  Indication  Answer:  Suspect ACS    Signed and Held   Signed and Held  Occult blood card to lab, stool  As needed,   R     Signed and Held          Vitals/Pain Today's Vitals   11/13/18 0307 11/13/18 0330 11/13/18 0501 11/13/18 0506  BP: (!) 136/98 (!) 130/96 (!) 144/101 (!) 144/101  Pulse: 94 95  (!) 105  Resp: 14 17 (!) 22 20  Temp:    97.9 F (36.6 C)  TempSrc:    Oral  SpO2: 100% 100%  100%  Weight:      Height:      PainSc:        Isolation Precautions No active isolations  Medications Medications  0.9 %  sodium chloride infusion ( Intravenous New Bag/Given 11/13/18 0325)  oxyCODONE-acetaminophen (PERCOCET/ROXICET) 5-325 MG per tablet 1 tablet (1 tablet Oral Given 11/13/18 0352)  LORazepam (ATIVAN) injection 1 mg (1 mg Intravenous Given 11/13/18 0514)  LORazepam (ATIVAN) tablet 1 mg (has no administration in time range)    Or  LORazepam (ATIVAN) injection 1 mg (has no administration in time range)  thiamine (VITAMIN B-1) tablet 100 mg (has no administration in time range)    Or  thiamine (B-1) injection 100 mg (has no administration in time range)  folic acid (FOLVITE) tablet 1 mg (has no administration in time range)  multivitamin with minerals tablet 1 tablet (has no administration in time range)  LORazepam (ATIVAN) injection 0-4 mg (has no administration in time range)    Followed by  LORazepam (ATIVAN) injection 0-4 mg (has no administration in time range)  sodium chloride flush (NS) 0.9 % injection 3 mL (3 mLs Intravenous Given 11/13/18 0305)  sodium chloride 0.9 % bolus 1,000 mL (0 mLs Intravenous Stopped 11/13/18 0508)  famotidine (PEPCID) IVPB 20 mg premix (0 mg Intravenous Stopped 11/13/18 0357)  alum & mag hydroxide-simeth (MAALOX/MYLANTA) 200-200-20 MG/5ML suspension 30 mL (30 mLs Oral Given 11/13/18 0328)    And  lidocaine (XYLOCAINE) 2 % viscous mouth solution 15 mL (15 mLs Oral Given 11/13/18 0328)    Mobility walks

## 2018-11-13 NOTE — Progress Notes (Addendum)
Patient seen and examined personally, I reviewed the chart, history and physical and admission note, done by admitting physician this morning and agree with the same with following addendum.  Please refer to the morning admission note for more detailed plan of care.  Briefly,  Per hpi- 48 y.o. male with medical history significant of hypertension, asthma, WPW, sickle cell trait, anxiety, PTSD, bipolar disorder, migraine headache, gastric ulcer disease, polysubstance abuse (cocaine, alcohol, tobacco), CKD stage III, chronic abdominal pain due to chronic pancreatitis, p/w chest pain, abdominal pain, seizure. CP since 7/6 morning at about 11 AM, which is located in mid and left side of chest, burning-like pain, /10, radiating to the left jaw and left shoulder w DOE. Used Viagra at about 12:00 in noon.  Also c/o abdominal pain started in the evening, constant, moderate, diffused burning-like pain with nausea, vomiting and diarrhea, vomited 4 times with little blood in the vomitus.  He also reports many episodes of loose stool bowel movement.  Denies symptoms of UTI. Initially, pt did not have neurologic complaints or symptoms. Per report, later on, pt wanted to leave on AMA, but he developed 1 episode of seizure in ED which lasted for about 30 seconds. He does not remember what happened after he woke up.  In ER, WBC 12.2, troponin NEG, pending FOBT, alcohol level less than 10, negatrive COVID-19 test, AKI with creatinine 2.19, BUN 16, lipase 67,  Vitals stable with tachycardia,negative chest x-ray, Neg CT head. Was admitted for further workup  Seen/examined this am, Sleepy, got ativan this am. treomrs in hands. C/o left sided abd pain and left sided CP- which comes and goes- o nRA. Appears older than stated age.  ON  Exam, Aao, lethargic, not in distress Chest clear, mildly tender on left chest abd- soft, mildly tender on left side. BS+ Non focal  Issues being addressed  Chest pain, atypical- neg  HS trop x2. EKG TWI in V4-5. cardaic cath in 2014 by Dr Drue Dun he does not f/u with any cardio as o/p- cardio consulted here. Asa on hold due to blood in vomitus  Abdominal pain with Nausea/Vomitting/Diarrhea- had chronic abd pain, lipase slightly up. lfts stable last ct abd 6/7- chronic calcific pancreatitis.  Continue supportive care with IV fluids, Protonix twice daily nausea medication  Blood in vomitus/Upper GI bleed: last EGD 10/14/18 Dr Paulita Fujita with "Non-bleeding gastric ulcer with no stigmata of bleeding"- placed on PPI bid and advised o/p follw up in 4-6 wk. FOBT pending. H/H -12.5 gm- repeat hb down to 10.9 gm- cont ppi bid.Called in GI consult-spoke with Dr. Percell Miller okay for ice chips n.p.o. past midnight for possible endoscopy.  Seizure episode x1 in ER, unclear etiology, could be due to etoh withdrawal- etoh  Neg in ER- reports drinks 6 pack beer/day- last drink 2 days back. Ct head neg. EEG ordered, hold tramadol, cont neurocheck prn ativan in order. Cont seizure precautions  AKI creat at 2.48m likley from #2- down to 1.4, Cont ivf, hold nephrotoxic meds  Other issues, per HPI COCAINE ABUSE- uds+ Leucocytosis COPD HTN Bipolar disorder/PTSD  Cont treatment plan as mentioned in HPI/and above Anticipate d/c tomorrow pending further workup

## 2018-11-13 NOTE — H&P (View-Only) (Signed)
[facility] CONSULT Reason for consult: Hematemesis Referring Physician: [facility] hospitalist.  PCP: Marliss Coots, NP.  Primary gastroenterologist none.  Patient previously saw Dr. Paulita Fujita but has been dismissed from [facility] is seen as an unassigned patient  [name] is an 48 y.o. male.  HPI: He has a medical history significant for multiple problems.  He has had at least 2 EGDs in the past year all showing gastric ulcers duodenal ulcers etc.  He also has chronic pancreatitis by CT criteria due to alcohol with calcifications and irregular pancreatic duct on CT.  He was just in the hospital a month ago, EGD at that time showed gastric and duodenal ulcers.  Biopsies negative for H. pylori.  He was discharged on Protonix and Carafate.  He was supposed to be taking pancreatic enzymes for chronic pancreatitis. The patient has history of multiple problems including chronic alcohol abuse along with polysubstance abuse cocaine possibly other drugs as well.  He has CKD stage III and has been on pancreatic enzyme replacement due to chronic pancreatitis for some time.  He has sickle cell trait chronic anxiety depression bipolar disorder and PTSD.  Has hypertension and asthma.  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  He drinks at least 6 or 7 beers a day sometimes more sometimes liquor.  He came back in with shortness of breath chest pain.  He has had a cath and echo within the past 5 years.  The cardiac cath was normal and the echo was normal as well.  He was seen by cardiology.Their feeling was that his chest pain was noncardiac.  This was based on his description and the fact that his troponins utilizing the high-sensitivity method showed no significant change and were felt to be normal as was his EKG.  The patient claims that he has been taking Protonix and Carafate since his discharge.  Chest x-ray was negative.  CT of the head showed chronic post trauma to the left  orbit area with surgical repair but no acute changes.  Labs reveal slightly elevated lipase.  Hemoglobin was 12.5 has dropped to 10.9.    Past Medical History:  Diagnosis Date  . Alcoholism /alcohol abuse ([place])   . Anemia   . Anxiety   . Arthritis    "knees; arms; elbows" (03/26/2015)  . Asthma   . Bipolar disorder ([place])   . Chronic bronchitis ([place])   . Chronic lower back pain   . Chronic pancreatitis ([place])   . Cocaine abuse ([place])   . Depression   . Family history of adverse reaction to anesthesia    "grandmother gets confused"  . Femoral condyle fracture ([place]) 03/08/2014   left medial/notes 03/09/2014  . GERD (gastroesophageal reflux disease)   . H/O hiatal hernia   . H/O suicide attempt 10/2012  . High cholesterol   . History of blood transfusion 10/2012   "when I tried to commit suicide"  . History of stomach ulcers   . Hypertension   . Marijuana abuse, continuous   . Migraine    "a few times/year" (03/26/2015)  . Pneumonia 1990's X 3  . PTSD (post-traumatic stress disorder)   . Sickle cell trait ([place])   . WPW (Wolff-Parkinson-White syndrome)    [name] 03/06/2013    Past Surgical History:  Procedure Laterality Date  . BIOPSY  11/25/2017   Procedure: BIOPSY;  Surgeon: Arta Silence, MD;  Location: [facility] ENDOSCOPY;  Service: Endoscopy;;  .  BIOPSY  10/14/2018   Procedure: BIOPSY;  Surgeon: Arta Silence, MD;  Location: [facility];  Service: Endoscopy;;  . CARDIAC CATHETERIZATION    . ESOPHAGOGASTRODUODENOSCOPY (EGD) WITH PROPOFOL N/A 11/25/2017   Procedure: ESOPHAGOGASTRODUODENOSCOPY (EGD) WITH PROPOFOL;  Surgeon: Arta Silence, MD;  Location: [place];  Service: Endoscopy;  Laterality: N/A;  . ESOPHAGOGASTRODUODENOSCOPY (EGD) WITH PROPOFOL Left 10/14/2018   Procedure: ESOPHAGOGASTRODUODENOSCOPY (EGD) WITH PROPOFOL;  Surgeon: Arta Silence, MD;  Location: [facility] ENDOSCOPY;  Service: Endoscopy;  Laterality: Left;  . EYE SURGERY Left 1990's   "result of trauma"   .  FACIAL FRACTURE SURGERY Left 1990's   "result of trauma"   . FRACTURE SURGERY    . HERNIA REPAIR    . LEFT HEART CATHETERIZATION WITH CORONARY ANGIOGRAM Right 03/07/2013   Procedure: LEFT HEART CATHETERIZATION WITH CORONARY ANGIOGRAM;  Surgeon: [name], MD;  Location: [facility] CATH LAB;  Service: Cardiovascular;  Laterality: Right;  . UMBILICAL HERNIA REPAIR      Family History  Problem Relation Age of Onset  . Hypertension Mother   . Cirrhosis Mother   . Alcoholism Mother   . Hypertension Father   . Melanoma Father   . Hypertension Other   . Coronary artery disease Other     Social History:  reports that he has been smoking cigarettes and e-cigarettes. He has a 33.00 pack-year smoking history. He has never used smokeless tobacco. He reports current alcohol use. He reports current drug use. Drugs: Marijuana and Cocaine.  Allergies:  Allergies  Allergen Reactions  . Robaxin [Methocarbamol] Other (See Comments)    "jumpy limbs"  . Shellfish-Derived Products Nausea And Vomiting  . Trazodone Other (See Comments)    Muscle spasms  . Trazodone And Nefazodone Other (See Comments)    Muscle spasms  . Adhesive [Tape] Itching  . Latex Itching  . Toradol [Ketorolac Tromethamine] Other (See Comments)    Has ulcers; cannot have this  . Contrast Media [Iodinated Diagnostic Agents] Hives  . Reglan [Metoclopramide] Other (See Comments)    Muscle spasms    Medications; Prior to Admission medications   Medication Sig Start Date End Date Taking? Authorizing Provider  amitriptyline (ELAVIL) 25 MG tablet Take 1 tablet (25 mg total) by mouth at bedtime. 10/15/18  Yes [name], MD  Cyanocobalamin (VITAMIN B-12 PO) Take 1 tablet by mouth daily.   Yes [provider]  folic acid (FOLVITE) 1 MG tablet Take 1 tablet (1 mg total) by mouth daily. 11/26/17  Yes [name], MD  hydrALAZINE (APRESOLINE) 25 MG tablet Take 25 mg by mouth daily.   Yes [provider]   lipase/protease/amylase (CREON) 12000 units CPEP capsule Take 1 capsule (12,000 Units total) by mouth 3 (three) times daily with meals. For pancreatitis 10/15/18  Yes [name], MD  loratadine (CLARITIN) 10 MG tablet Take 1 tablet (10 mg total) by mouth daily as needed for allergies or rhinitis. (May purchase from over the counter): For allergies Patient taking differently: Take 10 mg by mouth daily. (May purchase from over the counter): For allergies 10/01/17  Yes Aline August, MD  LORazepam (ATIVAN) 0.5 MG tablet Take 0.5 mg by mouth every 8 (eight) hours as needed for anxiety.   Yes [provider]  metoprolol tartrate (LOPRESSOR) 100 MG tablet Take 100 mg by mouth daily.   Yes [provider]  Multiple Vitamin (MULTIVITAMIN WITH MINERALS) TABS tablet Take 1 tablet by mouth daily. 09/06/17  Yes Bonnell Public, MD  nicotine (NICODERM CQ - DOSED IN MG/24 HOURS) 21 mg/24hr patch Place 1 patch (21 mg total) onto the skin daily. 10/16/18  Yes [name], MD  nicotine polacrilex (NICORETTE) 4 MG gum Take 4 mg by mouth daily as needed for smoking cessation.   Yes [provider]  ondansetron (ZOFRAN) 4 MG tablet Take 4 mg by mouth every 8 (eight) hours as needed for nausea or vomiting.   Yes [provider]  oxyCODONE-acetaminophen (PERCOCET/ROXICET) 5-325 MG tablet Take 1 tablet by mouth every 8 (eight) hours as needed for severe pain.   Yes [provider]  pantoprazole (PROTONIX) 40 MG tablet Take 1 tablet (40 mg total) by mouth 2 (two) times daily before a meal. 10/15/18  Yes [name], MD  QUEtiapine (SEROQUEL) 100 MG tablet Take 100 mg by mouth at bedtime.   Yes [provider]  sertraline (ZOLOFT) 100 MG tablet Take 1 tablet (100 mg total) by mouth daily. Patient taking differently: Take 150 mg by mouth daily.  08/29/17  Yes Clent Demark, PA-C  sucralfate (CARAFATE) 1 GM/10ML suspension Take 10 mLs (1 g total) by mouth 4  (four) times daily -  with meals and at bedtime. 10/15/18  Yes [name], MD  thiamine 100 MG tablet Take 1 tablet (100 mg total) by mouth daily. 10/16/18  Yes [name], MD  traMADol (ULTRAM) 50 MG tablet Take 50 mg by mouth every 8 (eight) hours as needed for moderate pain.  10/30/18  Yes [provider]  gabapentin (NEURONTIN) 100 MG capsule Take 1 capsule (100 mg total) by mouth 2 (two) times daily. Patient not taking: Reported on 11/13/2018 10/15/18   [name], MD   . amitriptyline  25 mg Oral QHS  . folic acid  1 mg Oral Daily  . hydrALAZINE  25 mg Oral Daily  . lipase/protease/amylase  12,000 Units Oral TID WC  . loratadine  10 mg Oral Daily  . LORazepam  0-4 mg Intravenous Q6H   Followed by  . [START ON 11/15/2018] LORazepam  0-4 mg Intravenous Q12H  . metoprolol tartrate  100 mg Oral Daily  . multivitamin with minerals  1 tablet Oral Daily  . multivitamin with minerals  1 tablet Oral Daily  . nicotine  21 mg Transdermal Daily  . pantoprazole  40 mg Oral BID AC  . QUEtiapine  100 mg Oral QHS  . sertraline  150 mg Oral Daily  . sucralfate  1 g Oral TID WC & HS  . thiamine  100 mg Oral Daily   Or  . thiamine  100 mg Intravenous Daily  . vitamin B-12  1,000 mcg Oral Daily   PRN Meds acetaminophen, hydrALAZINE, LORazepam, LORazepam **OR** LORazepam, LORazepam, ondansetron (ZOFRAN) IV, oxyCODONE-acetaminophen Results for orders placed or performed during the hospital encounter of 11/13/18 (from the past 48 hour(s))  Basic metabolic panel     Status: Abnormal   Collection Time: 11/13/18  1:30 AM  Result Value Ref Range   Sodium 132 (L) 135 - 145 mmol/L   Potassium 3.8 3.5 - 5.1 mmol/L   Chloride 99 98 - 111 mmol/L   CO2 18 (L) 22 - 32 mmol/L   Glucose, Bld 88 70 - 99 mg/dL   BUN 16 6 - 20 mg/dL   Creatinine, Ser 2.19 (H) 0.61 - 1.24 mg/dL   Calcium 8.6 (L) 8.9 - 10.3 mg/dL   GFR calc non Af Amer 34 (L) >60 mL/min  GFR calc Af Amer 40 (L) >60 mL/min    Anion gap 15 5 - 15    Comment: Performed at [facility], [place] [address] [place], [place] [address]  CBC     Status: Abnormal   Collection Time: 11/13/18  1:30 AM  Result Value Ref Range   WBC 12.2 (H) 4.0 - 10.5 K/uL   RBC 3.94 (L) 4.22 - 5.81 MIL/uL   Hemoglobin 12.5 (L) 13.0 - 17.0 g/dL   HCT 39.3 39.0 - 52.0 %   MCV 99.7 80.0 - 100.0 fL   MCH 31.7 26.0 - 34.0 pg   MCHC 31.8 30.0 - 36.0 g/dL   RDW 13.8 11.5 - 15.5 %   Platelets 166 150 - 400 K/uL   nRBC 0.0 0.0 - 0.2 %    Comment: Performed at [facility], [place] [address] [place], [place] [address]  Troponin I (High Sensitivity)     Status: None   Collection Time: 11/13/18  1:30 AM  Result Value Ref Range   Troponin I (High Sensitivity) 5 <18 ng/L    Comment: (NOTE) Elevated high sensitivity troponin I (hsTnI) values and significant  changes across serial measurements may suggest ACS but many other  chronic and acute conditions are known to elevate hsTnI results.  Refer to the "Links" section for chest pain algorithms and additional  guidance. Performed at [facility], [place] [address], [place], [place] [address]   Hepatic function panel     Status: Abnormal   Collection Time: 11/13/18  1:30 AM  Result Value Ref Range   Total Protein 8.8 (H) 6.5 - 8.1 g/dL   Albumin 4.6 3.5 - 5.0 g/dL   AST 56 (H) 15 - 41 U/L   ALT 40 0 - 44 U/L   Alkaline Phosphatase 111 38 - 126 U/L   Total Bilirubin 1.1 0.3 - 1.2 mg/dL   Bilirubin, Direct 0.2 0.0 - 0.2 mg/dL   Indirect Bilirubin 0.9 0.3 - 0.9 mg/dL    Comment: Performed at [facility], [place] [address], [place], [place] [address]  Lipase, blood     Status: Abnormal   Collection Time: 11/13/18  1:30 AM  Result Value Ref Range   Lipase 67 (H) 11 - 51 U/L    Comment: Performed at [facility], [place] [address], [place], [place] [address]  Ethanol     Status: None   Collection Time: 11/13/18   1:30 AM  Result Value Ref Range   Alcohol, Ethyl (B) <10 <10 mg/dL    Comment: (NOTE) Lowest detectable limit for serum alcohol is 10 mg/dL. For medical purposes only. Performed at [facility], [place] [address] [place], [place] [address]   Troponin I (High Sensitivity)     Status: None   Collection Time: 11/13/18  3:02 AM  Result Value Ref Range   Troponin I (High Sensitivity) 6.0 <18 ng/L    Comment: (NOTE) Elevated high sensitivity troponin I (hsTnI) values and significant  changes across serial measurements may suggest ACS but many other  chronic and acute conditions are known to elevate hsTnI results.  Refer to the "Links" section for chest pain algorithms and additional  guidance. Performed at [facility], [place] [address], [place], [place] [address]   SARS Coronavirus 2 (CEPHEID - Performed in [facility] hospital lab), Hosp Order     Status: None   Collection Time: 11/13/18  3:02 AM  Specimen: Nasopharyngeal Swab  Result Value Ref Range   SARS Coronavirus 2 NEGATIVE NEGATIVE    Comment: (NOTE) If result is NEGATIVE SARS-CoV-2 target nucleic acids are NOT DETECTED. The SARS-CoV-2 RNA is generally detectable in upper and lower  respiratory specimens during the acute phase of infection. The lowest  concentration of SARS-CoV-2 viral copies this assay can detect is 250  copies / mL. A negative result does not preclude SARS-CoV-2 infection  and should not be used as the sole basis for treatment or other  patient management decisions.  A negative result may occur with  improper specimen collection / handling, submission of specimen other  than nasopharyngeal swab, presence of viral mutation(s) within the  areas targeted by this assay, and inadequate number of viral copies  (<250 copies / mL). A negative result must be combined with clinical  observations, patient history, and epidemiological information. If result is POSITIVE SARS-CoV-2  target nucleic acids are DETECTED. The SARS-CoV-2 RNA is generally detectable in upper and lower  respiratory specimens dur ing the acute phase of infection.  Positive  results are indicative of active infection with SARS-CoV-2.  Clinical  correlation with patient history and other diagnostic information is  necessary to determine patient infection status.  Positive results do  not rule out bacterial infection or co-infection with other viruses. If result is PRESUMPTIVE POSTIVE SARS-CoV-2 nucleic acids MAY BE PRESENT.   A presumptive positive result was obtained on the submitted specimen  and confirmed on repeat testing.  While 2019 novel coronavirus  (SARS-CoV-2) nucleic acids may be present in the submitted sample  additional confirmatory testing may be necessary for epidemiological  and / or clinical management purposes  to differentiate between  SARS-CoV-2 and other Sarbecovirus currently known to infect humans.  If clinically indicated additional testing with an alternate test  methodology [phone]) is advised. The SARS-CoV-2 RNA is generally  detectable in upper and lower respiratory sp ecimens during the acute  phase of infection. The expected result is Negative. Fact Sheet for Patients:  StrictlyIdeas.no Fact Sheet for Healthcare Providers: BankingDealers.co.za This test is not yet approved or cleared by the [place] FDA and has been authorized for detection and/or diagnosis of SARS-CoV-2 by FDA under an Emergency Use Authorization (EUA).  This EUA will remain in effect (meaning this test can be used) for the duration of the COVID-19 declaration under Section 564(b)(1) of the Act, 21 U.S.C. section 360bbb-3(b)(1), unless the authorization is terminated or revoked sooner. Performed at [facility], [place] [address], [place], [place] [address]   Protime-INR     Status: None   Collection Time: 11/13/18  6:34  AM  Result Value Ref Range   Prothrombin Time 13.6 11.4 - 15.2 seconds   INR 1.1 0.8 - 1.2    Comment: (NOTE) INR goal varies based on device and disease states. Performed at [facility], [place] [address] [place], [place] [address]   APTT     Status: None   Collection Time: 11/13/18  6:34 AM  Result Value Ref Range   aPTT 30 24 - 36 seconds    Comment: Performed at [facility], [place] [address] [place], [place] [address]  Type and screen [place]     Status: None   Collection Time: 11/13/18  6:34 AM  Result Value Ref Range   ABO/RH(D) B POS    Antibody Screen NEG    Sample Expiration  11/16/2018,2359 Performed at [facility], [place] [address], [place], [place] [address]   Hemoglobin A1c     Status: Abnormal   Collection Time: 11/13/18  6:34 AM  Result Value Ref Range   Hgb A1c MFr Bld 4.6 (L) 4.8 - 5.6 %    Comment: (NOTE) Pre diabetes:          5.7%-6.4% Diabetes:              >6.4% Glycemic control for   <7.0% adults with diabetes    Mean Plasma Glucose 85.32 mg/dL    Comment: Performed at [place] [address] [place], [place] [address]  Lipid panel     Status: None   Collection Time: 11/13/18  6:34 AM  Result Value Ref Range   Cholesterol 176 0 - 200 mg/dL   Triglycerides 134 <150 mg/dL   HDL 77 >40 mg/dL   Total CHOL/HDL Ratio 2.3 RATIO   VLDL 27 0 - 40 mg/dL   LDL Cholesterol 72 0 - 99 mg/dL    Comment:        Total Cholesterol/HDL:CHD Risk Coronary Heart Disease Risk Table                     Men   Women  1/2 Average Risk   3.4   3.3  Average Risk       5.0   4.4  2 X Average Risk   9.6   7.1  3 X Average Risk  23.4   11.0        Use the calculated Patient Ratio above and the CHD Risk Table to determine the patient's CHD Risk.        ATP III CLASSIFICATION (LDL):  <100     mg/dL   Optimal  100-129  mg/dL   Near or Above                    Optimal  130-159   mg/dL   Borderline  160-189  mg/dL   High  >190     mg/dL   Very High Performed at [place] [address] [place], [place] [address]   Troponin I (High Sensitivity)     Status: None   Collection Time: 11/13/18  6:34 AM  Result Value Ref Range   Troponin I (High Sensitivity) 4.0 <18 ng/L    Comment: (NOTE) Elevated high sensitivity troponin I (hsTnI) values and significant  changes across serial measurements may suggest ACS but many other  chronic and acute conditions are known to elevate hsTnI results.  Refer to the "Links" section for chest pain algorithms and additional  guidance. Performed at [facility], [place] [address], [place], [place] [address]   ABO/Rh     Status: None   Collection Time: 11/13/18  8:34 AM  Result Value Ref Range   ABO/RH(D)      B POS Performed at [facility], [place] [address], [place], [place] [address]   Urine rapid drug screen (hosp performed)     Status: Abnormal   Collection Time: 11/13/18  8:44 AM  Result Value Ref Range   Opiates NONE DETECTED NONE DETECTED   Cocaine POSITIVE (A) NONE DETECTED   Benzodiazepines NONE DETECTED NONE DETECTED   Amphetamines NONE DETECTED NONE DETECTED   Tetrahydrocannabinol NONE DETECTED NONE DETECTED   Barbiturates NONE DETECTED NONE DETECTED    Comment: (NOTE) DRUG SCREEN FOR  MEDICAL PURPOSES ONLY.  IF CONFIRMATION IS NEEDED FOR ANY PURPOSE, NOTIFY LAB WITHIN 5 DAYS. LOWEST DETECTABLE LIMITS FOR URINE DRUG SCREEN Drug Class                     Cutoff (ng/mL) Amphetamine and metabolites    1000 Barbiturate and metabolites    200 Benzodiazepine                 700 Tricyclics and metabolites     300 Opiates and metabolites        300 Cocaine and metabolites        300 THC                            50 Performed at [facility], [place] [address] [place], [place] [address]   Troponin I (High Sensitivity)     Status: None    Collection Time: 11/13/18  9:19 AM  Result Value Ref Range   Troponin I (High Sensitivity) 3.0 <18 ng/L    Comment: (NOTE) Elevated high sensitivity troponin I (hsTnI) values and significant  changes across serial measurements may suggest ACS but many other  chronic and acute conditions are known to elevate hsTnI results.  Refer to the "Links" section for chest pain algorithms and additional  guidance. Performed at [facility], [place] [address], [place], [place] [address]   Basic metabolic panel     Status: Abnormal   Collection Time: 11/13/18 10:06 AM  Result Value Ref Range   Sodium 134 (L) 135 - 145 mmol/L   Potassium 3.5 3.5 - 5.1 mmol/L   Chloride 103 98 - 111 mmol/L   CO2 19 (L) 22 - 32 mmol/L   Glucose, Bld 75 70 - 99 mg/dL   BUN 16 6 - 20 mg/dL   Creatinine, Ser 1.42 (H) 0.61 - 1.24 mg/dL   Calcium 7.6 (L) 8.9 - 10.3 mg/dL   GFR calc non Af Amer 58 (L) >60 mL/min   GFR calc Af Amer >60 >60 mL/min   Anion gap 12 5 - 15    Comment: Performed at [facility], [place] [address], [place], [place] [address]  Hemoglobin and hematocrit, blood     Status: Abnormal   Collection Time: 11/13/18 10:06 AM  Result Value Ref Range   Hemoglobin 10.9 (L) 13.0 - 17.0 g/dL   HCT 34.6 (L) 39.0 - 52.0 %    Comment: Performed at [facility], [place] [address], [place], [place] [address]    Dg Chest 2 View  Result Date: 11/13/2018 CLINICAL DATA:  Chest pain. EXAM: CHEST - 2 VIEW COMPARISON:  July 09, 2018 FINDINGS: The previously demonstrated right middle lobe pneumonia has substantially improved. There are a few chronic appearing airspace opacities bilaterally. There is no pneumothorax. No large pleural effusion. There is no acute osseous abnormality. The heart size is normal. IMPRESSION: No active cardiopulmonary disease. Electronically Signed   By: Constance Holster M.D.   On: 11/13/2018 01:17   Ct Head Wo Contrast  Result Date:  11/13/2018 CLINICAL DATA:  Seizure, new, abnormal neuro exam.  Confusion. EXAM: CT HEAD WITHOUT CONTRAST TECHNIQUE: Contiguous axial images were obtained from the base of the skull through the vertex without intravenous contrast. COMPARISON:  CT head without contrast 03/09/2014 FINDINGS: Brain: No acute infarct, hemorrhage, or mass lesion is present. No significant white matter lesions are present. The ventricles  are of normal size. No significant extraaxial fluid collection is present. The brainstem and cerebellum are within normal limits. Vascular: No hyperdense vessel or unexpected calcification. Skull: Postsurgical changes of the high right parietal lobe are again noted. Calvarium is otherwise intact. No focal lytic or blastic lesions are present. Sinuses/Orbits: Posttraumatic changes and repair of the right orbit are noted. No acute abnormalities are present. The paranasal sinuses and mastoid air cells are clear. IMPRESSION: 1. Normal CT appearance of the brain. No acute abnormalities to explain seizure or altered mental status. 2. 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 Electronically Signed   By: San Morelle M.D.   On: 11/13/2018 06:09               Blood pressure 118/88, pulse 79, temperature 97.7 F (36.5 C), temperature source Oral, resp. rate 16, height 5\' 9"  (1.753 m), weight 54.6 kg, SpO2 100 %.  Physical exam:   General--thin [name] states he is in pain and needs more pain medication ENT--grossly normal Neck--supple no lymphadenopathy or masses Heart--normal S1 and S2 without murmurs or gallops Lungs--clear Abdomen--nondistended normal bowel sounds mild tenderness in the epigastrium Psych--alert seems focused on more pain medication   Assessment: 1.  Abdominal pain/nausea and vomiting.  Has been a drop in hemoglobin and there is a question of hematemesis.  He had known ulcers about a month ago.  He states he has been  taking his medication but I am not entirely sure that is the case.  Think he needs a repeat EGD. 2.  Chest pain.  Cardiac work-up negative seems to be atypical possibly due to recurrent gastric ulcers etc. 3.  Chronic pancreatitis.  Calcifications on CT with dilated duct slightly elevated lipase he reports that he has been taking his pancreatic enzymes but still seems to be having pain. 4.  Alcohol abuse/polysubstance abuse 5.  History of PTSD, depression, suicide attempt 6.  History of WPW  Plan: 1.  We will continue to treat him with Carafate and Protonix now. 2.  We will plan for repeat EGD to reassess the state of his gastric and duodenal ulcers.  Have discussed that with patient and he is agreeable. 3.  The patient requests that I give him more pain medication which I declined.  I told him it would be better if he change his pain medication from a single source and suggested that he discuss this further with the hospitalist team. 4.  EGD will be planned sometime tomorrow.  Would continue Carafate, Protonix, and Creon   Nancy Fetter 11/13/2018, 3:40 PM   This note was created using voice recognition software and minor errors may Have occurred unintentionally. Pager: [phone] If no answer or after hours call [phone]

## 2018-11-13 NOTE — ED Notes (Signed)
EMT Lehman Prom turned around at nurses station looking at room 15. Pt in room 15 had decided to leave AMA and was taking steps towards the door to leave. EMT Lehman Prom witnessed pt fall backwards from standing position to the floor with head up against the wall underneath cardiac monitor. Pt also noted to being experiencing seizure like activity while on the floor. Pt was noted to have a small amount of blood from his mouth. Pt placed on flat sheet and lifted to the bed with help of other staff members. Pt noted to be diaphoretic and incontinent of his bowels.

## 2018-11-13 NOTE — ED Notes (Signed)
Pt ambulated to BR and back to room with steady gait. Pt able to stop and speak with staff members at nurses station while ambulating

## 2018-11-13 NOTE — ED Notes (Signed)
Pt found on the floor in room with what looked like to be having a seizure. Pt assisted back into bed with multiple staff members from ED. Pt placed back on cardiac monitoring, a full set of vital signs obtained and seizure precautions implemented. Primary RN Ilene at bedside at this time.

## 2018-11-13 NOTE — Consult Note (Signed)
[facility] CONSULT Reason for consult: Hematemesis Referring Physician: [facility] hospitalist.  PCP: Marliss Coots, NP.  Primary gastroenterologist none.  Patient previously saw Dr. Paulita Fujita but has been dismissed from [facility] is seen as an unassigned patient  [name] is an 48 y.o. male.  HPI: He has a medical history significant for multiple problems.  He has had at least 2 EGDs in the past year all showing gastric ulcers duodenal ulcers etc.  He also has chronic pancreatitis by CT criteria due to alcohol with calcifications and irregular pancreatic duct on CT.  He was just in the hospital a month ago, EGD at that time showed gastric and duodenal ulcers.  Biopsies negative for H. pylori.  He was discharged on Protonix and Carafate.  He was supposed to be taking pancreatic enzymes for chronic pancreatitis. The patient has history of multiple problems including chronic alcohol abuse along with polysubstance abuse cocaine possibly other drugs as well.  He has CKD stage III and has been on pancreatic enzyme replacement due to chronic pancreatitis for some time.  He has sickle cell trait chronic anxiety depression bipolar disorder and PTSD.  Has hypertension and asthma.  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  He drinks at least 6 or 7 beers a day sometimes more sometimes liquor.  He came back in with shortness of breath chest pain.  He has had a cath and echo within the past 5 years.  The cardiac cath was normal and the echo was normal as well.  He was seen by cardiology.Their feeling was that his chest pain was noncardiac.  This was based on his description and the fact that his troponins utilizing the high-sensitivity method showed no significant change and were felt to be normal as was his EKG.  The patient claims that he has been taking Protonix and Carafate since his discharge.  Chest x-ray was negative.  CT of the head showed chronic post trauma to the left  orbit area with surgical repair but no acute changes.  Labs reveal slightly elevated lipase.  Hemoglobin was 12.5 has dropped to 10.9.    Past Medical History:  Diagnosis Date  . Alcoholism /alcohol abuse ([place])   . Anemia   . Anxiety   . Arthritis    "knees; arms; elbows" (03/26/2015)  . Asthma   . Bipolar disorder ([place])   . Chronic bronchitis ([place])   . Chronic lower back pain   . Chronic pancreatitis ([place])   . Cocaine abuse ([place])   . Depression   . Family history of adverse reaction to anesthesia    "grandmother gets confused"  . Femoral condyle fracture ([place]) 03/08/2014   left medial/notes 03/09/2014  . GERD (gastroesophageal reflux disease)   . H/O hiatal hernia   . H/O suicide attempt 10/2012  . High cholesterol   . History of blood transfusion 10/2012   "when I tried to commit suicide"  . History of stomach ulcers   . Hypertension   . Marijuana abuse, continuous   . Migraine    "a few times/year" (03/26/2015)  . Pneumonia 1990's X 3  . PTSD (post-traumatic stress disorder)   . Sickle cell trait ([place])   . WPW (Wolff-Parkinson-White syndrome)    [name] 03/06/2013    Past Surgical History:  Procedure Laterality Date  . BIOPSY  11/25/2017   Procedure: BIOPSY;  Surgeon: Arta Silence, MD;  Location: [facility] ENDOSCOPY;  Service: Endoscopy;;  .  BIOPSY  10/14/2018   Procedure: BIOPSY;  Surgeon: Arta Silence, MD;  Location: [facility];  Service: Endoscopy;;  . CARDIAC CATHETERIZATION    . ESOPHAGOGASTRODUODENOSCOPY (EGD) WITH PROPOFOL N/A 11/25/2017   Procedure: ESOPHAGOGASTRODUODENOSCOPY (EGD) WITH PROPOFOL;  Surgeon: Arta Silence, MD;  Location: [facility];  Service: Endoscopy;  Laterality: N/A;  . ESOPHAGOGASTRODUODENOSCOPY (EGD) WITH PROPOFOL Left 10/14/2018   Procedure: ESOPHAGOGASTRODUODENOSCOPY (EGD) WITH PROPOFOL;  Surgeon: Arta Silence, MD;  Location[facility] END[facility]COPY;  Service: Endoscopy;  Laterality: Left;  . EYE SURGERY Left 1990's   "result of trauma"   .  FACIAL FRACTURE SURGERY Left 1990's   "result of trauma"   . FRACTURE SURGERY    . HERNIA REPAIR    . LEFT HEART CATHETERIZATION WITH CORONARY ANGIOGRAM Right 03/07/2013   Procedure: LEFT HEART CATHETERIZATION WITH CORONARY ANGIOGRAM;  Surgeon: [name], MD;  Location: [facility] CATH LAB;  Service: Cardiovascular;  Laterality: Right;  . UMBILICAL HERNIA REPAIR      Family History  Problem Relation Age of Onset  . Hypertension Mother   . Cirrhosis Mother   . Alcoholism Mother   . Hypertension Father   . Melanoma Father   . Hypertension Other   . Coronary artery disease Other     Social History:  reports that he has been smoking cigarettes and e-cigarettes. He has a 33.00 pack-year smoking history. He has never used smokeless tobacco. He reports current alcohol use. He reports current drug use. Drugs: Marijuana and Cocaine.  Allergies:  Allergies  Allergen Reactions  . Robaxin [Methocarbamol] Other (See Comments)    "jumpy limbs"  . Shellfish-Derived Products Nausea And Vomiting  . Trazodone Other (See Comments)    Muscle spasms  . Trazodone And Nefazodone Other (See Comments)    Muscle spasms  . Adhesive [Tape] Itching  . Latex Itching  . Toradol [Ketorolac Tromethamine] Other (See Comments)    Has ulcers; cannot have this  . Contrast Media [Iodinated Diagnostic Agents] Hives  . Reglan [Metoclopramide] Other (See Comments)    Muscle spasms    Medications; Prior to Admission medications   Medication Sig Start Date End Date Taking? Authorizing Provider  amitriptyline (ELAVIL) 25 MG tablet Take 1 tablet (25 mg total) by mouth at bedtime. 10/15/18  Yes [name], MD  Cyanocobalamin (VITAMIN B-12 PO) Take 1 tablet by mouth daily.   Yes [provider]  folic acid (FOLVITE) 1 MG tablet Take 1 tablet (1 mg total) by mouth daily. 11/26/17  Yes [name], MD  hydrALAZINE (APRESOLINE) 25 MG tablet Take 25 mg by mouth daily.   Yes [provider]   lipase/protease/amylase (CREON) 12000 units CPEP capsule Take 1 capsule (12,000 Units total) by mouth 3 (three) times daily with meals. For pancreatitis 10/15/18  Yes [name], MD  loratadine (CLARITIN) 10 MG tablet Take 1 tablet (10 mg total) by mouth daily as needed for allergies or rhinitis. (May purchase from over the counter): For allergies Patient taking differently: Take 10 mg by mouth daily. (May purchase from over the counter): For allergies 10/01/17  Yes Aline August, MD  LORazepam (ATIVAN) 0.5 MG tablet Take 0.5 mg by mouth every 8 (eight) hours as needed for anxiety.   Yes [provider]  metoprolol tartrate (LOPRESSOR) 100 MG tablet Take 100 mg by mouth daily.   Yes [provider]  Multiple Vitamin (MULTIVITAMIN WITH MINERALS) TABS tablet Take 1 tablet by mouth daily. 09/06/17  Yes Bonnell Public, MD  nicotine (NICODERM CQ - DOSED IN MG/24 HOURS) 21 mg/24hr patch Place 1 patch (21 mg total) onto the skin daily. 10/16/18  Yes [name], MD  nicotine polacrilex (NICORETTE) 4 MG gum Take 4 mg by mouth daily as needed for smoking cessation.   Yes [provider]  ondansetron (ZOFRAN) 4 MG tablet Take 4 mg by mouth every 8 (eight) hours as needed for nausea or vomiting.   Yes [provider]  oxyCODONE-acetaminophen (PERCOCET/ROXICET) 5-325 MG tablet Take 1 tablet by mouth every 8 (eight) hours as needed for severe pain.   Yes [provider]  pantoprazole (PROTONIX) 40 MG tablet Take 1 tablet (40 mg total) by mouth 2 (two) times daily before a meal. 10/15/18  Yes [name], MD  QUEtiapine (SEROQUEL) 100 MG tablet Take 100 mg by mouth at bedtime.   Yes [provider]  sertraline (ZOLOFT) 100 MG tablet Take 1 tablet (100 mg total) by mouth daily. Patient taking differently: Take 150 mg by mouth daily.  08/29/17  Yes Clent Demark, PA-C  sucralfate (CARAFATE) 1 GM/10ML suspension Take 10 mLs (1 g total) by mouth 4  (four) times daily -  with meals and at bedtime. 10/15/18  Yes [name], MD  thiamine 100 MG tablet Take 1 tablet (100 mg total) by mouth daily. 10/16/18  Yes [name], MD  traMADol (ULTRAM) 50 MG tablet Take 50 mg by mouth every 8 (eight) hours as needed for moderate pain.  10/30/18  Yes [provider]  gabapentin (NEURONTIN) 100 MG capsule Take 1 capsule (100 mg total) by mouth 2 (two) times daily. Patient not taking: Reported on 11/13/2018 10/15/18   [name], MD   . amitriptyline  25 mg Oral QHS  . folic acid  1 mg Oral Daily  . hydrALAZINE  25 mg Oral Daily  . lipase/protease/amylase  12,000 Units Oral TID WC  . loratadine  10 mg Oral Daily  . LORazepam  0-4 mg Intravenous Q6H   Followed by  . [START ON 11/15/2018] LORazepam  0-4 mg Intravenous Q12H  . metoprolol tartrate  100 mg Oral Daily  . multivitamin with minerals  1 tablet Oral Daily  . multivitamin with minerals  1 tablet Oral Daily  . nicotine  21 mg Transdermal Daily  . pantoprazole  40 mg Oral BID AC  . QUEtiapine  100 mg Oral QHS  . sertraline  150 mg Oral Daily  . sucralfate  1 g Oral TID WC & HS  . thiamine  100 mg Oral Daily   Or  . thiamine  100 mg Intravenous Daily  . vitamin B-12  1,000 mcg Oral Daily   PRN Meds acetaminophen, hydrALAZINE, LORazepam, LORazepam **OR** LORazepam, LORazepam, ondansetron (ZOFRAN) IV, oxyCODONE-acetaminophen Results for orders placed or performed during the hospital encounter of 11/13/18 (from the past 48 hour(s))  Basic metabolic panel     Status: Abnormal   Collection Time: 11/13/18  1:30 AM  Result Value Ref Range   Sodium 132 (L) 135 - 145 mmol/L   Potassium 3.8 3.5 - 5.1 mmol/L   Chloride 99 98 - 111 mmol/L   CO2 18 (L) 22 - 32 mmol/L   Glucose, Bld 88 70 - 99 mg/dL   BUN 16 6 - 20 mg/dL   Creatinine, Ser 2.19 (H) 0.61 - 1.24 mg/dL   Calcium 8.6 (L) 8.9 - 10.3 mg/dL   GFR calc non Af Amer 34 (L) >60 mL/min  GFR calc Af Amer 40 (L) >60 mL/min    Anion gap 15 5 - 15    Comment: Performed at [facility], [place] [address], [place], [place] [address]  CBC     Status: Abnormal   Collection Time: 11/13/18  1:30 AM  Result Value Ref Range   WBC 12.2 (H) 4.0 - 10.5 K/uL   RBC 3.94 (L) 4.22 - 5.81 MIL/uL   Hemoglobin 12.5 (L) 13.0 - 17.0 g/dL   HCT 39.3 39.0 - 52.0 %   MCV 99.7 80.0 - 100.0 fL   MCH 31.7 26.0 - 34.0 pg   MCHC 31.8 30.0 - 36.0 g/dL   RDW 13.8 11.5 - 15.5 %   Platelets 166 150 - 400 K/uL   nRBC 0.0 0.0 - 0.2 %    Comment: Performed at [facility], [place] [address] [place], [place] [address]  Troponin I (High Sensitivity)     Status: None   Collection Time: 11/13/18  1:30 AM  Result Value Ref Range   Troponin I (High Sensitivity) 5 <18 ng/L    Comment: (NOTE) Elevated high sensitivity troponin I (hsTnI) values and significant  changes across serial measurements may suggest ACS but many other  chronic and acute conditions are known to elevate hsTnI results.  Refer to the "Links" section for chest pain algorithms and additional  guidance. Performed at [facility], [place] [address] [place], [place] [address]   Hepatic function panel     Status: Abnormal   Collection Time: 11/13/18  1:30 AM  Result Value Ref Range   Total Protein 8.8 (H) 6.5 - 8.1 g/dL   Albumin 4.6 3.5 - 5.0 g/dL   AST 56 (H) 15 - 41 U/L   ALT 40 0 - 44 U/L   Alkaline Phosphatase 111 38 - 126 U/L   Total Bilirubin 1.1 0.3 - 1.2 mg/dL   Bilirubin, Direct 0.2 0.0 - 0.2 mg/dL   Indirect Bilirubin 0.9 0.3 - 0.9 mg/dL    Comment: Performed at [facility], [place] [address] [place], [place] [address]  Lipase, blood     Status: Abnormal   Collection Time: 11/13/18  1:30 AM  Result Value Ref Range   Lipase 67 (H) 11 - 51 U/L    Comment: Performed at [facility], [place] [address] [place], [place] [address]  Ethanol     Status: None   Collection Time: 11/13/18   1:30 AM  Result Value Ref Range   Alcohol, Ethyl (B) <10 <10 mg/dL    Comment: (NOTE) Lowest detectable limit for serum alcohol is 10 mg/dL. For medical purposes only. Performed at [facility], [place] [address], [place], [place] [address]   Troponin I (High Sensitivity)     Status: None   Collection Time: 11/13/18  3:02 AM  Result Value Ref Range   Troponin I (High Sensitivity) 6.0 <18 ng/L    Comment: (NOTE) Elevated high sensitivity troponin I (hsTnI) values and significant  changes across serial measurements may suggest ACS but many other  chronic and acute conditions are known to elevate hsTnI results.  Refer to the "Links" section for chest pain algorithms and additional  guidance. Performed at [facility], [place] [address], [place], [place] [address]   SARS Coronavirus 2 (CEPHEID - Performed in [facility] hospital lab), Hosp Order     Status: None   Collection Time: 11/13/18  3:02 AM  Specimen: Nasopharyngeal Swab  Result Value Ref Range   SARS Coronavirus 2 NEGATIVE NEGATIVE    Comment: (NOTE) If result is NEGATIVE SARS-CoV-2 target nucleic acids are NOT DETECTED. The SARS-CoV-2 RNA is generally detectable in upper and lower  respiratory specimens during the acute phase of infection. The lowest  concentration of SARS-CoV-2 viral copies this assay can detect is 250  copies / mL. A negative result does not preclude SARS-CoV-2 infection  and should not be used as the sole basis for treatment or other  patient management decisions.  A negative result may occur with  improper specimen collection / handling, submission of specimen other  than nasopharyngeal swab, presence of viral mutation(s) within the  areas targeted by this assay, and inadequate number of viral copies  (<250 copies / mL). A negative result must be combined with clinical  observations, patient history, and epidemiological information. If result is POSITIVE SARS-CoV-2  target nucleic acids are DETECTED. The SARS-CoV-2 RNA is generally detectable in upper and lower  respiratory specimens dur ing the acute phase of infection.  Positive  results are indicative of active infection with SARS-CoV-2.  Clinical  correlation with patient history and other diagnostic information is  necessary to determine patient infection status.  Positive results do  not rule out bacterial infection or co-infection with other viruses. If result is PRESUMPTIVE POSTIVE SARS-CoV-2 nucleic acids MAY BE PRESENT.   A presumptive positive result was obtained on the submitted specimen  and confirmed on repeat testing.  While 2019 novel coronavirus  (SARS-CoV-2) nucleic acids may be present in the submitted sample  additional confirmatory testing may be necessary for epidemiological  and / or clinical management purposes  to differentiate between  SARS-CoV-2 and other Sarbecovirus currently known to infect humans.  If clinically indicated additional testing with an alternate test  methodology [phone]) is advised. The SARS-CoV-2 RNA is generally  detectable in upper and lower respiratory sp ecimens during the acute  phase of infection. The expected result is Negative. Fact Sheet for Patients:  StrictlyIdeas.no Fact Sheet for Healthcare Providers: BankingDealers.co.za This test is not yet approved or cleared by the [place] FDA and has been authorized for detection and/or diagnosis of SARS-CoV-2 by FDA under an Emergency Use Authorization (EUA).  This EUA will remain in effect (meaning this test can be used) for the duration of the COVID-19 declaration under Section 564(b)(1) of the Act, 21 U.S.C. section 360bbb-3(b)(1), unless the authorization is terminated or revoked sooner. Performed at [facility], [place] [address] [place], [place] [address]   Protime-INR     Status: None   Collection Time: 11/13/18  6:34  AM  Result Value Ref Range   Prothrombin Time 13.6 11.4 - 15.2 seconds   INR 1.1 0.8 - 1.2    Comment: (NOTE) INR goal varies based on device and disease states. Performed at [facility], [place] [address] [place], [place] [address]   APTT     Status: None   Collection Time: 11/13/18  6:34 AM  Result Value Ref Range   aPTT 30 24 - 36 seconds    Comment: Performed at [facility], [place] [address], [place],  [address]  Type and screen [place]     Status: None   Collection Time: 11/13/18  6:34 AM  Result Value Ref Range   ABO/RH(D) B POS    Antibody Screen NEG    Sample Expiration  11/16/2018,2359 Performed at [facility], [place] [address] [place], [place] [address]   Hemoglobin A1c     Status: Abnormal   Collection Time: 11/13/18  6:34 AM  Result Value Ref Range   Hgb A1c MFr Bld 4.6 (L) 4.8 - 5.6 %    Comment: (NOTE) Pre diabetes:          5.7%-6.4% Diabetes:              >6.4% Glycemic control for   <7.0% adults with diabetes    Mean Plasma Glucose 85.32 mg/dL    Comment: Performed at [place] [address], [place], [place] [address]  Lipid panel     Status: None   Collection Time: 11/13/18  6:34 AM  Result Value Ref Range   Cholesterol 176 0 - 200 mg/dL   Triglycerides 134 <150 mg/dL   HDL 77 >40 mg/dL   Total CHOL/HDL Ratio 2.3 RATIO   VLDL 27 0 - 40 mg/dL   LDL Cholesterol 72 0 - 99 mg/dL    Comment:        Total Cholesterol/HDL:CHD Risk Coronary Heart Disease Risk Table                     Men   Women  1/2 Average Risk   3.4   3.3  Average Risk       5.0   4.4  2 X Average Risk   9.6   7.1  3 X Average Risk  23.4   11.0        Use the calculated Patient Ratio above and the CHD Risk Table to determine the patient's CHD Risk.        ATP III CLASSIFICATION (LDL):  <100     mg/dL   Optimal  100-129  mg/dL   Near or Above                    Optimal  130-159   mg/dL   Borderline  160-189  mg/dL   High  >190     mg/dL   Very High Performed at [place] [address], [place], [place] [address]   Troponin I (High Sensitivity)     Status: None   Collection Time: 11/13/18  6:34 AM  Result Value Ref Range   Troponin I (High Sensitivity) 4.0 <18 ng/L    Comment: (NOTE) Elevated high sensitivity troponin I (hsTnI) values and significant  changes across serial measurements may suggest ACS but many other  chronic and acute conditions are known to elevate hsTnI results.  Refer to the "Links" section for chest pain algorithms and additional  guidance. Performed at [facility], [place] [address] [place], [place] [address]   ABO/Rh     Status: None   Collection Time: 11/13/18  8:34 AM  Result Value Ref Range   ABO/RH(D)      B POS Performed at [facility], [place] [address], [place], [place] [address]   Urine rapid drug screen (hosp performed)     Status: Abnormal   Collection Time: 11/13/18  8:44 AM  Result Value Ref Range   Opiates NONE DETECTED NONE DETECTED   Cocaine POSITIVE (A) NONE DETECTED   Benzodiazepines NONE DETECTED NONE DETECTED   Amphetamines NONE DETECTED NONE DETECTED   Tetrahydrocannabinol NONE DETECTED NONE DETECTED   Barbiturates NONE DETECTED NONE DETECTED    Comment: (NOTE) DRUG SCREEN FOR  MEDICAL PURPOSES ONLY.  IF CONFIRMATION IS NEEDED FOR ANY PURPOSE, NOTIFY LAB WITHIN 5 DAYS. LOWEST DETECTABLE LIMITS FOR URINE DRUG SCREEN Drug Class                     Cutoff (ng/mL) Amphetamine and metabolites    1000 Barbiturate and metabolites    200 Benzodiazepine                 086 Tricyclics and metabolites     300 Opiates and metabolites        300 Cocaine and metabolites        300 THC                            50 Performed at [facility], [place] [address], [place], [place] [address]   Troponin I (High Sensitivity)     Status: None    Collection Time: 11/13/18  9:19 AM  Result Value Ref Range   Troponin I (High Sensitivity) 3.0 <18 ng/L    Comment: (NOTE) Elevated high sensitivity troponin I (hsTnI) values and significant  changes across serial measurements may suggest ACS but many other  chronic and acute conditions are known to elevate hsTnI results.  Refer to the "Links" section for chest pain algorithms and additional  guidance. Performed at [facility], [place] [address] [place], [place] [address]   Basic metabolic panel     Status: Abnormal   Collection Time: 11/13/18 10:06 AM  Result Value Ref Range   Sodium 134 (L) 135 - 145 mmol/L   Potassium 3.5 3.5 - 5.1 mmol/L   Chloride 103 98 - 111 mmol/L   CO2 19 (L) 22 - 32 mmol/L   Glucose, Bld 75 70 - 99 mg/dL   BUN 16 6 - 20 mg/dL   Creatinine, Ser 1.42 (H) 0.61 - 1.24 mg/dL   Calcium 7.6 (L) 8.9 - 10.3 mg/dL   GFR calc non Af Amer 58 (L) >60 mL/min   GFR calc Af Amer >60 >60 mL/min   Anion gap 12 5 - 15    Comment: Performed at [facility], [place] [address], [place], [place] [address]  Hemoglobin and hematocrit, blood     Status: Abnormal   Collection Time: 11/13/18 10:06 AM  Result Value Ref Range   Hemoglobin 10.9 (L) 13.0 - 17.0 g/dL   HCT 34.6 (L) 39.0 - 52.0 %    Comment: Performed at [facility], [place] [address], [place], [place] [address]    Dg Chest 2 View  Result Date: 11/13/2018 CLINICAL DATA:  Chest pain. EXAM: CHEST - 2 VIEW COMPARISON:  July 09, 2018 FINDINGS: The previously demonstrated right middle lobe pneumonia has substantially improved. There are a few chronic appearing airspace opacities bilaterally. There is no pneumothorax. No large pleural effusion. There is no acute osseous abnormality. The heart size is normal. IMPRESSION: No active cardiopulmonary disease. Electronically Signed   By: Constance Holster M.D.   On: 11/13/2018 01:17   Ct Head Wo Contrast  Result Date:  11/13/2018 CLINICAL DATA:  Seizure, new, abnormal neuro exam.  Confusion. EXAM: CT HEAD WITHOUT CONTRAST TECHNIQUE: Contiguous axial images were obtained from the base of the skull through the vertex without intravenous contrast. COMPARISON:  CT head without contrast 03/09/2014 FINDINGS: Brain: No acute infarct, hemorrhage, or mass lesion is present. No significant white matter lesions are present. The ventricles  are of normal size. No significant extraaxial fluid collection is present. The brainstem and cerebellum are within normal limits. Vascular: No hyperdense vessel or unexpected calcification. Skull: Postsurgical changes of the high right parietal lobe are again noted. Calvarium is otherwise intact. No focal lytic or blastic lesions are present. Sinuses/Orbits: Posttraumatic changes and repair of the right orbit are noted. No acute abnormalities are present. The paranasal sinuses and mastoid air cells are clear. IMPRESSION: 1. Normal CT appearance of the brain. No acute abnormalities to explain seizure or altered mental status. 2. 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 Electronically Signed   By: San Morelle M.D.   On: 11/13/2018 06:09               Blood pressure 118/88, pulse 79, temperature 97.7 F (36.5 C), temperature source Oral, resp. rate 16, height 5\' 9"  (1.753 m), weight 54.6 kg, SpO2 100 %.  Physical exam:   General--thin [name] states he is in pain and needs more pain medication ENT--grossly normal Neck--supple no lymphadenopathy or masses Heart--normal S1 and S2 without murmurs or gallops Lungs--clear Abdomen--nondistended normal bowel sounds mild tenderness in the epigastrium Psych--alert seems focused on more pain medication   Assessment: 1.  Abdominal pain/nausea and vomiting.  Has been a drop in hemoglobin and there is a question of hematemesis.  He had known ulcers about a month ago.  He states he has been  taking his medication but I am not entirely sure that is the case.  Think he needs a repeat EGD. 2.  Chest pain.  Cardiac work-up negative seems to be atypical possibly due to recurrent gastric ulcers etc. 3.  Chronic pancreatitis.  Calcifications on CT with dilated duct slightly elevated lipase he reports that he has been taking his pancreatic enzymes but still seems to be having pain. 4.  Alcohol abuse/polysubstance abuse 5.  History of PTSD, depression, suicide attempt 6.  History of WPW  Plan: 1.  We will continue to treat him with Carafate and Protonix now. 2.  We will plan for repeat EGD to reassess the state of his gastric and duodenal ulcers.  Have discussed that with patient and he is agreeable. 3.  The patient requests that I give him more pain medication which I declined.  I told him it would be better if he change his pain medication from a single source and suggested that he discuss this further with the hospitalist team. 4.  EGD will be planned sometime tomorrow.  Would continue Carafate, Protonix, and Creon   Nancy Fetter 11/13/2018, 3:40 PM   This note was created using voice recognition software and minor errors may Have occurred unintentionally. Pager: [phone] If no answer or after hours call [phone]

## 2018-11-13 NOTE — ED Provider Notes (Signed)
[facility] DEPT Provider Note   CSN: [id number] Arrival date & time: 11/13/18  0032     History   Chief Complaint Chief Complaint  Patient presents with   Chest Pain    HPI [name] is a 48 y.o. male.     The history is provided by the patient and medical records.     48 year old male with history of alcoholism, chronic pancreatitis, depression, GERD, hyperlipidemia, hypertension, polysubstance abuse, chronic pain, presenting to the ED with abdominal pain.  States for the past 2 days he has been having worsening abdominal pain with some nausea, vomiting, and diarrhea.  States tonight it started to radiate up into his chest and into the left shoulder and left jaw.  States he did have some shortness of breath but that seems to have improved at this time.  States his stomach is "burning".  He has been taking Carafate and other medications prescribed by his GI doctor for his stomach ulcers without improvement.  Is also taking chronic tramadol.  He denies any recent alcohol use.  Triage note reports use of Viagra earlier today however this was not mentioned to me.  He is not currently prescribed Viagra.  Past Medical History:  Diagnosis Date   Alcoholism /alcohol abuse ([place])    Anemia    Anxiety    Arthritis    "knees; arms; elbows" (03/26/2015)   Asthma    Bipolar disorder (HCC)    Chronic bronchitis (HCC)    Chronic lower back pain    Chronic pancreatitis ([place])    Cocaine abuse ([place])    Depression    Family history of adverse reaction to anesthesia    "grandmother gets confused"   Femoral condyle fracture ([place]) 03/08/2014   left medial/notes 03/09/2014   GERD (gastroesophageal reflux disease)    H/O hiatal hernia    H/O suicide attempt 10/2012   Heart murmur    "when he was little" (03/06/2013)   High cholesterol    History of blood transfusion 10/2012   "when I tried to commit suicide"   History of  stomach ulcers    Hypertension    Marijuana abuse, continuous    Migraine    "a few times/year" (03/26/2015)   Pneumonia 1990's X 3   PTSD (post-traumatic stress disorder)    Shortness of breath    "can happen at anytime" (03/06/2013)   Sickle cell trait ([place])    WPW (Wolff-Parkinson-White syndrome)    [name] 03/06/2013    Patient Active Problem List   Diagnosis Date Noted   Gastritis and gastroduodenitis    Chest pain 01/08/2018   GI bleed 11/24/2017   Acute blood loss anemia 11/24/2017   Atypical chest pain 11/24/2017   Acute pancreatitis 09/28/2017   Abdominal pain 05/27/2017   Hematemesis 05/27/2017   Tachycardia 03/18/2017   Diarrhea 03/18/2017   Acute on chronic pancreatitis ([place]) 12/17/2016   Intractable nausea and vomiting 12/05/2016   Verbally abusive behavior 12/05/2016   Normocytic anemia 12/05/2016   Alcohol use disorder, severe, dependence ([place]) 07/25/2016   Cocaine use disorder, severe, dependence ([place]) 07/25/2016   Major depressive disorder, 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/20/2016   Leukocytosis    Hospital acquired PNA 05/20/2015   Chronic pancreatitis ([place]) 05/18/2015   Pseudocyst of pancreas 05/18/2015   Polysubstance abuse (tobacco, cocaine, THC, and ETOH) 03/26/2015   Alcohol-induced chronic pancreatitis ([place])    Benign essential HTN 02/06/2014   Alcohol-induced acute pancreatitis 11/28/2013  Pancreatic pseudocyst/cyst 11/25/2013   Severe protein-calorie malnutrition ([place]) 10/10/2013   Suicide attempt ([place]) 10/08/2013   [name] White pattern seen on electrocardiogram 10/03/2012   TOBACCO ABUSE 03/23/2007    Past Surgical History:  Procedure Laterality Date   BIOPSY  11/25/2017   Procedure: BIOPSY;  Surgeon: Arta Silence, MD;  Location: [facility] ENDOSCOPY;  Service: Endoscopy;;   BIOPSY  10/14/2018   Procedure: BIOPSY;  Surgeon: Arta Silence, MD;  Location: [facility];   Service: Endoscopy;;   CARDIAC CATHETERIZATION     ESOPHAGOGASTRODUODENOSCOPY (EGD) WITH PROPOFOL N/A 11/25/2017   Procedure: ESOPHAGOGASTRODUODENOSCOPY (EGD) WITH PROPOFOL;  Surgeon: Arta Silence, MD;  Location: [facility];  Service: Endoscopy;  Laterality: N/A;   ESOPHAGOGASTRODUODENOSCOPY (EGD) WITH PROPOFOL Left 10/14/2018   Procedure: ESOPHAGOGASTRODUODENOSCOPY (EGD) WITH PROPOFOL;  Surgeon: Arta Silence, MD;  Location[facility] ENDOSCOPY;  Service: Endoscopy;  Laterality: Left;   EYE SURGERY Left 1990's   "result of trauma"    FACIAL FRACTURE SURGERY Left 1990's   "result of trauma"    FRACTURE SURGERY     HERNIA REPAIR     LEFT HEART CATHETERIZATION WITH CORONARY ANGIOGRAM Right 03/07/2013   Procedure: LEFT HEART CATHETERIZATION WITH CORONARY ANGIOGRAM;  Surgeon: [name], MD;  Location: [facility] CATH LAB;  Service: Cardiovascular;  Laterality: Right;   UMBILICAL HERNIA REPAIR          Home Medications    Prior to Admission medications   Medication Sig Start Date End Date Taking? Authorizing Provider  amitriptyline (ELAVIL) 25 MG tablet Take 1 tablet (25 mg total) by mouth at bedtime. 10/15/18   [name], MD  Cyanocobalamin (VITAMIN B-12 PO) Take 1 tablet by mouth daily.    [provider]  folic acid (FOLVITE) 1 MG tablet Take 1 tablet (1 mg total) by mouth daily. 11/26/17   [name], MD  gabapentin (NEURONTIN) 100 MG capsule Take 1 capsule (100 mg total) by mouth 2 (two) times daily. 10/15/18   [name], MD  hydrOXYzine (ATARAX/VISTARIL) 25 MG tablet Take 25 mg by mouth 3 (three) times daily.    [provider]  lipase/protease/amylase (CREON) 12000 units CPEP capsule Take 1 capsule (12,000 Units total) by mouth 3 (three) times daily with meals. For pancreatitis 10/15/18   [name], MD  loratadine (CLARITIN) 10 MG tablet Take 1 tablet (10 mg total) by mouth daily as needed for allergies or rhinitis. (May purchase from over the  counter): For allergies 10/01/17   Aline August, MD  Multiple Vitamin (MULTIVITAMIN WITH MINERALS) TABS tablet Take 1 tablet by mouth daily. 09/06/17   Bonnell Public, MD  nicotine (NICODERM CQ - DOSED IN MG/24 HOURS) 21 mg/24hr patch Place 1 patch (21 mg total) onto the skin daily. 10/16/18   [name], MD  pantoprazole (PROTONIX) 40 MG tablet Take 1 tablet (40 mg total) by mouth 2 (two) times daily before a meal. 10/15/18   [name], MD  promethazine (PHENERGAN) 25 MG tablet Take 25 mg by mouth daily as needed for nausea or vomiting.    [provider]  QUEtiapine (SEROQUEL) 100 MG tablet Take 100 mg by mouth at bedtime.    [provider]  sertraline (ZOLOFT) 100 MG tablet Take 1 tablet (100 mg total) by mouth daily. Patient taking differently: Take 150 mg by mouth daily.  08/29/17   Clent Demark, PA-C  sucralfate (CARAFATE) 1 GM/10ML suspension Take 10 mLs (1 g total) by mouth 4 (  four) times daily -  with meals and at bedtime. 10/15/18   [name], MD  thiamine 100 MG tablet Take 1 tablet (100 mg total) by mouth daily. 10/16/18   [name], MD    Family History Family History  Problem Relation Age of Onset   Hypertension Mother    Cirrhosis Mother    Alcoholism Mother    Hypertension Father    Melanoma Father    Hypertension Other    Coronary artery disease Other     Social History Social History   Tobacco Use   Smoking status: Current Every Day Smoker    Packs/day: 1.00    Years: 33.00    Pack years: 33.00    Types: Cigarettes, E-cigarettes   Smokeless tobacco: Never Used  Substance Use Topics   Alcohol use: Yes   Drug use: Yes    Types: Marijuana, Cocaine    Comment: daily marijuana use; last cocaine use about 3 months ago     Allergies   Robaxin [methocarbamol], Shellfish-derived products, Trazodone, Trazodone and nefazodone, Adhesive [tape], Latex, Toradol [ketorolac tromethamine], Contrast media [iodinated  diagnostic agents], and Reglan [metoclopramide]   Review of Systems Review of Systems  Cardiovascular: Positive for chest pain.  Gastrointestinal: Positive for abdominal pain, diarrhea, nausea and vomiting.  All other systems reviewed and are negative.    Physical Exam Updated Vital Signs BP (!) 136/98 (BP Location: Left Arm)    Pulse 94    Temp 98.6 F (37 C) (Oral)    Resp 14    Ht 5\' 9"  (1.753 m)    Wt 55.8 kg    SpO2 100%    BMI 18.16 kg/m   Physical Exam Vitals signs and nursing note reviewed.  Constitutional:      Appearance: He is well-developed.     Comments: Thin, appears somewhat malnourished  HENT:     Head: Normocephalic and atraumatic.  Eyes:     Conjunctiva/sclera: Conjunctivae normal.     Pupils: Pupils are equal, round, and reactive to light.  Neck:     Musculoskeletal: Normal range of motion.  Cardiovascular:     Rate and Rhythm: Normal rate and regular rhythm.     Heart sounds: Normal heart sounds.  Pulmonary:     Effort: Pulmonary effort is normal.     Breath sounds: Normal breath sounds.  Abdominal:     General: Bowel sounds are normal.     Palpations: Abdomen is soft.     Tenderness: There is no abdominal tenderness.     Comments: Intermittently gripping his stomach during exam but is nontender when distracted  Musculoskeletal: Normal range of motion.  Skin:    General: Skin is warm and dry.  Neurological:     Mental Status: He is alert and oriented to person, place, and time.      ED Treatments / Results  Labs (all labs ordered are listed, but only abnormal results are displayed) Labs Reviewed  BASIC METABOLIC PANEL - Abnormal; Notable for the following components:      Result Value   Sodium 132 (*)    CO2 18 (*)    Creatinine, Ser 2.19 (*)    Calcium 8.6 (*)    GFR calc non Af Amer 34 (*)    GFR calc Af Amer 40 (*)    All other components within normal limits  CBC - Abnormal; Notable for the following components:   WBC 12.2 (*)     RBC 3.94 (*)  Hemoglobin 12.5 (*)    All other components within normal limits  HEPATIC FUNCTION PANEL - Abnormal; Notable for the following components:   Total Protein 8.8 (*)    AST 56 (*)    All other components within normal limits  LIPASE, BLOOD - Abnormal; Notable for the following components:   Lipase 67 (*)    All other components within normal limits  SARS CORONAVIRUS 2 (HOSPITAL ORDER, [facility])  TROPONIN I (HIGH SENSITIVITY)  TROPONIN I (HIGH SENSITIVITY)  ETHANOL    EKG EKG Interpretation  Date/Time:  Tuesday November 13 2018 00:46:40 EDT Ventricular Rate:  103 PR Interval:    QRS Duration: 74 QT Interval:  353 QTC Calculation: 463 R Axis:   72 Text Interpretation:  Sinus tachycardia Biatrial enlargement Nonspecific T abnormalities, lateral leads Baseline wander in lead(s) V1 No significant change since last tracing Confirmed by Pryor Curia [phone]) on 11/13/2018 12:56:11 AM   Radiology Dg Chest 2 View  Result Date: 11/13/2018 CLINICAL DATA:  Chest pain. EXAM: CHEST - 2 VIEW COMPARISON:  July 09, 2018 FINDINGS: The previously demonstrated right middle lobe pneumonia has substantially improved. There are a few chronic appearing airspace opacities bilaterally. There is no pneumothorax. No large pleural effusion. There is no acute osseous abnormality. The heart size is normal. IMPRESSION: No active cardiopulmonary disease. Electronically Signed   By: Constance Holster M.D.   On: 11/13/2018 01:17    Procedures Procedures (including critical care time)  Medications Ordered in ED Medications  0.9 %  sodium chloride infusion ( Intravenous New Bag/Given 11/13/18 0325)  oxyCODONE-acetaminophen (PERCOCET/ROXICET) 5-325 MG per tablet 1 tablet (1 tablet Oral Given 11/13/18 0352)  sodium chloride flush (NS) 0.9 % injection 3 mL (3 mLs Intravenous Given 11/13/18 0305)  sodium chloride 0.9 % bolus 1,000 mL (1,000 mLs Intravenous New Bag/Given 11/13/18 0306)    famotidine (PEPCID) IVPB 20 mg premix (20 mg Intravenous New Bag/Given 11/13/18 0327)  alum & mag hydroxide-simeth (MAALOX/MYLANTA) 200-200-20 MG/5ML suspension 30 mL (30 mLs Oral Given 11/13/18 0328)    And  lidocaine (XYLOCAINE) 2 % viscous mouth solution 15 mL (15 mLs Oral Given 11/13/18 0328)     Initial Impression / Assessment and Plan / ED Course  I have reviewed the triage vital signs and the nursing notes.  Pertinent labs & imaging results that were available during my care of the patient were reviewed by me and considered in my medical decision making (see chart for details).  48 year old male here with reported chest pain.  After talking with him this seems like more so abdominal pain with nausea, vomiting, and diarrhea that began radiating up into the chest today.  He has a history of chronic pain related to alcohol induced pancreatitis.  He is afebrile, non-toxic in appearance here.  He is intermittently clutching his abdomen during exam, but no focal tenderness or peritoneal signs when distracted.  Patient is well-known to this facility and he appears at his baseline.  His work-up is overall reassuring aside from new renal failure with a creatinine of 2.19.  EKG, troponin, and chest x-ray are all normal.  His pain does not seem cardiac in nature.  We will plan to admit for hydration.  Patient continually requesting pain medication, however his abdominal pain is chronic and I will avoid narcotics.  COVID screen has been sent.  Discussed with Dr. Blaine Hamper-- will admit for ongoing care.  Final Clinical Impressions(s) / ED Diagnoses   Final diagnoses:  Acute renal failure, unspecified acute renal failure type [facility])    ED Discharge Orders    None       Larene Pickett, PA-C 11/13/18 0404    Ward, Delice Bison, DO 11/13/18 [phone]

## 2018-11-13 NOTE — ED Notes (Signed)
Pt calling out to leave AMA

## 2018-11-13 NOTE — Consult Note (Addendum)
Cardiology Consultation:   Patient ID: [name] MRN: [id number]; DOB: [birth year]  Admit date: 11/13/2018 Date of Consult: 11/13/2018  Primary Care Provider: Marliss Coots, NP Primary Cardiologist: New to [place] Primary Electrophysiologist:  None    Patient Profile:   [name] is a 48 y.o. male with a hx of hypertension, asthma, WPW, sickle cell trait, anxiety, PTSD, bipolar disorder, migraine headache, gastric ulcer disease, polysubstance abuse (cocaine, alcohol, tobacco), CKD stage III, chronic abdominal pain due to chronic pancreatitis who is being seen today for the evaluation of pain at the request of Dr. Lupita Moran.  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 At work yesterday he says that he was a little short of breath. He also developed left lower chest aching with intermittent sharp pains, possibly worse with deep breath. He can localize the area of pain that is now constant, dull ache. He has had no palpitations, orthopnea, PND, edema.   Pt admits that he drinks alcohol on most days- 6-7 beers as well as some liquor. He did not drink anything yesterday, last was on Sunday. He also uses cocaine 1-2 times per week and marijuana regularly. He smokes ~1/2 PPD.   He had normal coronary arteries by cath in 02/2013. Normal echo in 01/2018.   High sensitivity troponins 5, 6.0, 4.0, 3.0 SCr 2.19 (was 0.81 on 10/15/2018) K+ 3.8 Lipid panel: TC 176, HDL 77, LDL 72, trig 134 Hgb 12.5 WBC 12.2 COVID 19 test negative CXR- no active cardiopulmonary disease UDS positive for cocaine  Heart Pathway Score:     Past Medical History:  Diagnosis Date   Alcoholism /alcohol abuse ([place])    Anemia    Anxiety    Arthritis    "knees; arms; elbows" (03/26/2015)   Asthma    Bipolar disorder (H[facility])    Chronic bronchitis ([facility])    Chronic lower back pain    Chronic pancreatitis ([place])    Cocaine abuse ([place])    Depression    Family history of  adverse reaction to anesthesia    "grandmother gets confused"   Femoral condyle fracture ([place]) 03/08/2014   left medial/notes 03/09/2014   GERD (gastroesophageal reflux disease)    H/O hiatal hernia    H/O suicide attempt 10/2012   Heart murmur    "when he was little" (03/06/2013)   High cholesterol    History of blood transfusion 10/2012   "when I tried to commit suicide"   History of stomach ulcers    Hypertension    Marijuana abuse, continuous    Migraine    "a few times/year" (03/26/2015)   Pneumonia 1990's X 3   PTSD (post-traumatic stress disorder)    Shortness of breath    "can happen at anytime" (03/06/2013)   Sickle cell trait ([place])    WPW (Wolff-Parkinson-White syndrome)    [name] 03/06/2013    Past Surgical History:  Procedure Laterality Date   BIOPSY  11/25/2017   Procedure: BIOPSY;  Surgeon: Arta Silence, MD;  Location: [facility] ENDOSCOPY;  Service: Endoscopy;;   BIOPSY  10/14/2018   Procedure: BIOPSY;  Surgeon: Arta Silence, MD;  Location: [facility];  Service: Endoscopy;;   CARDIAC CATHETERIZATION     ESOPHAGOGASTRODUODENOSCOPY (EGD) WITH PROPOFOL N/A 11/25/2017   Procedure: ESOPHAGOGASTRODUODENOSCOPY (EGD) WITH PROPOFOL;  Surgeon: Arta Silence, MD;  Location: [facility] ENDOSCOPY;  Service: Endoscopy;  Laterality: N/A;   ESOPHAGOGASTRODUODENOSCOPY (EGD) WITH PROPOFOL Left 10/14/2018   Procedure: ESOPHAGOGASTRODUODENOSCOPY (EGD) WITH PROPOFOL;  Surgeon:  Arta Silence, MD;  Location: [place];  Service: Endoscopy;  Laterality: Left;   EYE SURGERY Left 1990's   "result of trauma"    FACIAL FRACTURE SURGERY Left 1990's   "result of trauma"    FRACTURE SURGERY     HERNIA REPAIR     LEFT HEART CATHETERIZATION WITH CORONARY ANGIOGRAM Right 03/07/2013   Procedure: LEFT HEART CATHETERIZATION WITH CORONARY ANGIOGRAM;  Surgeon: [name], MD;  Location: [facility] CATH LAB;  Service: Cardiovascular;  Laterality: Right;   UMBILICAL HERNIA REPAIR        Home Medications:  Prior to Admission medications   Medication Sig Start Date End Date Taking? Authorizing Provider  amitriptyline (ELAVIL) 25 MG tablet Take 1 tablet (25 mg total) by mouth at bedtime. 10/15/18  Yes [name], MD  Cyanocobalamin (VITAMIN B-12 PO) Take 1 tablet by mouth daily.   Yes [provider]  folic acid (FOLVITE) 1 MG tablet Take 1 tablet (1 mg total) by mouth daily. 11/26/17  Yes [name], MD  hydrALAZINE (APRESOLINE) 25 MG tablet Take 25 mg by mouth daily.   Yes [provider]  lipase/protease/amylase (CREON) 12000 units CPEP capsule Take 1 capsule (12,000 Units total) by mouth 3 (three) times daily with meals. For pancreatitis 10/15/18  Yes [name], MD  loratadine (CLARITIN) 10 MG tablet Take 1 tablet (10 mg total) by mouth daily as needed for allergies or rhinitis. (May purchase from over the counter): For allergies Patient taking differently: Take 10 mg by mouth daily. (May purchase from over the counter): For allergies 10/01/17  Yes Aline August, MD  LORazepam (ATIVAN) 0.5 MG tablet Take 0.5 mg by mouth every 8 (eight) hours as needed for anxiety.   Yes [provider]  metoprolol tartrate (LOPRESSOR) 100 MG tablet Take 100 mg by mouth daily.   Yes [provider]  Multiple Vitamin (MULTIVITAMIN WITH MINERALS) TABS tablet Take 1 tablet by mouth daily. 09/06/17  Yes Dana Allan I, MD  nicotine (NICODERM CQ - DOSED IN MG/24 HOURS) 21 mg/24hr patch Place 1 patch (21 mg total) onto the skin daily. 10/16/18  Yes [name], MD  nicotine polacrilex (NICORETTE) 4 MG gum Take 4 mg by mouth daily as needed for smoking cessation.   Yes [provider]  ondansetron (ZOFRAN) 4 MG tablet Take 4 mg by mouth every 8 (eight) hours as needed for nausea or vomiting.   Yes [provider]  oxyCODONE-acetaminophen (PERCOCET/ROXICET) 5-325 MG tablet Take 1 tablet by mouth every 8 (eight) hours as needed for  severe pain.   Yes [provider]  pantoprazole (PROTONIX) 40 MG tablet Take 1 tablet (40 mg total) by mouth 2 (two) times daily before a meal. 10/15/18  Yes [name], MD  QUEtiapine (SEROQUEL) 100 MG tablet Take 100 mg by mouth at bedtime.   Yes [provider]  sertraline (ZOLOFT) 100 MG tablet Take 1 tablet (100 mg total) by mouth daily. Patient taking differently: Take 150 mg by mouth daily.  08/29/17  Yes Clent Demark, PA-C  sucralfate (CARAFATE) 1 GM/10ML suspension Take 10 mLs (1 g total) by mouth 4 (four) times daily -  with meals and at bedtime. 10/15/18  Yes [name], MD  thiamine 100 MG tablet Take 1 tablet (100 mg total) by mouth daily. 10/16/18  Yes [name], MD  traMADol (ULTRAM) 50 MG tablet Take 50 mg by mouth every 8 (eight) hours as needed  for moderate pain.  10/30/18  Yes [provider]  gabapentin (NEURONTIN) 100 MG capsule Take 1 capsule (100 mg total) by mouth 2 (two) times daily. Patient not taking: Reported on 11/13/2018 10/15/18   [name], MD    Inpatient Medications: Scheduled Meds:  amitriptyline  25 mg Oral QHS   folic acid  1 mg Oral Daily   hydrALAZINE  25 mg Oral Daily   lipase/protease/amylase  12,000 Units Oral TID WC   loratadine  10 mg Oral Daily   LORazepam  0-4 mg Intravenous Q6H   Followed by   Derrill Memo ON 11/15/2018] LORazepam  0-4 mg Intravenous Q12H   metoprolol tartrate  100 mg Oral Daily   multivitamin with minerals  1 tablet Oral Daily   multivitamin with minerals  1 tablet Oral Daily   nicotine  21 mg Transdermal Daily   pantoprazole  40 mg Oral BID AC   QUEtiapine  100 mg Oral QHS   sertraline  150 mg Oral Daily   sucralfate  1 g Oral TID WC & HS   thiamine  100 mg Oral Daily   Or   thiamine  100 mg Intravenous Daily   vitamin B-12  1,000 mcg Oral Daily   Continuous Infusions:  sodium chloride 125 mL/hr at 11/13/18 0325   PRN Meds: acetaminophen, hydrALAZINE,  LORazepam, LORazepam **OR** LORazepam, LORazepam, ondansetron (ZOFRAN) IV, oxyCODONE-acetaminophen  Allergies:    Allergies  Allergen Reactions   Robaxin [Methocarbamol] Other (See Comments)    "jumpy limbs"   Shellfish-Derived Products Nausea And Vomiting   Trazodone Other (See Comments)    Muscle spasms   Trazodone And Nefazodone Other (See Comments)    Muscle spasms   Adhesive [Tape] Itching   Latex Itching   Toradol [Ketorolac Tromethamine] Other (See Comments)    Has ulcers; cannot have this   Contrast Media [Iodinated Diagnostic Agents] Hives   Reglan [Metoclopramide] Other (See Comments)    Muscle spasms    Social History:   Social History   Socioeconomic History   Marital status: Single    Spouse name: Not on file   Number of children: Not on file   Years of education: Not on file   Highest education level: Not on file  Occupational History   Occupation: disabled  Social Designer, fashion/clothing strain: Not on file   Food insecurity    Worry: Not on file    Inability: Not on file   Transportation needs    Medical: Not on file    Non-medical: Not on file  Tobacco Use   Smoking status: Current Every Day Smoker    Packs/day: 1.00    Years: 33.00    Pack years: 33.00    Types: Cigarettes, E-cigarettes   Smokeless tobacco: Never Used  Substance and Sexual Activity   Alcohol use: Yes   Drug use: Yes    Types: Marijuana, Cocaine    Comment: daily marijuana use; last cocaine use about 3 months ago   Sexual activity: Yes    Birth control/protection: None  Lifestyle   Physical activity    Days per week: Not on file    Minutes per session: Not on file   Stress: Not on file  Relationships   Social connections    Talks on phone: Not on file    Gets together: Not on file    Attends religious service: Not on file    Active member of club or organization: Not on  file    Attends meetings of clubs or organizations: Not on file     Relationship status: Not on file   Intimate partner violence    Fear of current or ex partner: Not on file    Emotionally abused: Not on file    Physically abused: Not on file    Forced sexual activity: Not on file  Other Topics Concern   Not on file  Social History Narrative   ** Merged History Encounter **        Family History:    Family History  Problem Relation Age of Onset   Hypertension Mother    Cirrhosis Mother    Alcoholism Mother    Hypertension Father    Melanoma Father    Hypertension Other    Coronary artery disease Other      ROS:  Please see the history of present illness.   All other ROS reviewed and negative.     Physical Exam/Data:   Vitals:   11/13/18 0330 11/13/18 0501 11/13/18 0506 11/13/18 0556  BP: (!) 130/96 (!) 144/101 (!) 144/101 136/86  Pulse: 95  (!) 105 92  Resp: 17 (!) 22 20 14   Temp:   97.9 F (36.6 C) 98.4 F (36.9 C)  TempSrc:   Oral Oral  SpO2: 100%  100% 98%  Weight:    54.6 kg  Height:    5\' 9"  (1.753 m)    Intake/Output Summary (Last 24 hours) at 11/13/2018 0739 Last data filed at 11/13/2018 0600 Gross per 24 hour  Intake 1050 ml  Output --  Net 1050 ml   Last 3 Weights 11/13/2018 11/13/2018 10/15/2018  Weight (lbs) 120 lb 5.9 oz 123 lb 120 lb 14.4 oz  Weight (kg) 54.6 kg 55.792 kg 54.84 kg  Some encounter information is confidential and restricted. Go to Review Flowsheets activity to see all data.     Body mass index is 17.78 kg/m.  General:  Thin, well developed, in no acute distress HEENT: normal Lymph: no adenopathy Neck: no JVD Endocrine:  No thryomegaly Vascular: No carotid bruits; FA pulses 2+ bilaterally without bruits  Cardiac:  normal S1, S2; RRR; no murmur  Lungs:  clear to auscultation bilaterally, no wheezing, rhonchi or rales  Abd: soft, nontender, no hepatomegaly  Ext: no edema Musculoskeletal:  No deformities, BUE and BLE strength normal and equal Skin: warm and dry  Neuro:  CNs 2-12 intact,  no focal abnormalities noted Psych:  Normal affect   EKG:  The EKG was personally reviewed and demonstrates:  Sinus tach, 103 bpm, biatrial enlargement, non-specific T wave abn in lateral leads, V4-6 which have been intermittently present in the past.  Telemetry:  Telemetry was personally reviewed and demonstrates:  Sinus rhythm 80's-90's, no ectopy  Relevant CV Studies:  Echocardiogram 01/09/2018 Study Conclusions - Left ventricle: The cavity size was normal. Systolic function was   normal. The estimated ejection fraction was in the range of 60%   to 65%. Wall motion was normal; there were no regional wall   motion abnormalities. Left ventricular diastolic function   parameters were normal. - Aortic valve: There was no significant regurgitation. - Mitral valve: There was trivial regurgitation. - Right ventricle: Systolic function was normal. - Atrial septum: No defect or patent foramen ovale was identified. - Tricuspid valve: There was trivial regurgitation. - Pulmonic valve: There was no significant regurgitation. - Pulmonary arteries: Systolic pressure was mildly increased. PA   peak pressure: 32 mm Hg (S).  Impressions: - Normal LV systolic and diastolic function. No regional wall   motion abnormalities. No significant valvular disease.  IMPRESSION OF HEART CATHETERIZATION 03/07/2013 1. Normal left main coronary artery. 2. Normal left anterior descending artery and its branches. 3. Normal left circumflex artery and its branches. 4. Normal right coronary artery. 5. Normal left ventricular systolic function.  LVEDP 11 mmHg.  Ejection fraction 70%.  Laboratory Data:  High Sensitivity Troponin:   Recent Labs  Lab 11/13/18 0130 11/13/18 0302  TROPONINIHS 5 6.0     Cardiac EnzymesNo results for input(s): TROPONINI in the last 168 hours. No results for input(s): TROPIPOC in the last 168 hours.  Chemistry Recent Labs  Lab 11/13/18 0130  NA 132*  K 3.8  CL 99  CO2 18*   GLUCOSE 88  BUN 16  CREATININE 2.19*  CALCIUM 8.6*  GFRNONAA 34*  GFRAA 40*  ANIONGAP 15    Recent Labs  Lab 11/13/18 0130  PROT 8.8*  ALBUMIN 4.6  AST 56*  ALT 40  ALKPHOS 111  BILITOT 1.1   Hematology Recent Labs  Lab 11/13/18 0130  WBC 12.2*  RBC 3.94*  HGB 12.5*  HCT 39.3  MCV 99.7  MCH 31.7  MCHC 31.8  RDW 13.8  PLT 166   BNPNo results for input(s): BNP, PROBNP in the last 168 hours.  DDimer No results for input(s): DDIMER in the last 168 hours.   Radiology/Studies:  Dg Chest 2 View  Result Date: 11/13/2018 CLINICAL DATA:  Chest pain. EXAM: CHEST - 2 VIEW COMPARISON:  July 09, 2018 FINDINGS: The previously demonstrated right middle lobe pneumonia has substantially improved. There are a few chronic appearing airspace opacities bilaterally. There is no pneumothorax. No large pleural effusion. There is no acute osseous abnormality. The heart size is normal. IMPRESSION: No active cardiopulmonary disease. Electronically Signed   By: Constance Holster M.D.   On: 11/13/2018 01:17   Ct Head Wo Contrast  Result Date: 11/13/2018 CLINICAL DATA:  Seizure, new, abnormal neuro exam.  Confusion. EXAM: CT HEAD WITHOUT CONTRAST TECHNIQUE: Contiguous axial images were obtained from the base of the skull through the vertex without intravenous contrast. COMPARISON:  CT head without contrast 03/09/2014 FINDINGS: Brain: No acute infarct, hemorrhage, or mass lesion is present. No significant white matter lesions are present. The ventricles are of normal size. No significant extraaxial fluid collection is present. The brainstem and cerebellum are within normal limits. Vascular: No hyperdense vessel or unexpected calcification. Skull: Postsurgical changes of the high right parietal lobe are again noted. Calvarium is otherwise intact. No focal lytic or blastic lesions are present. Sinuses/Orbits: Posttraumatic changes and repair of the right orbit are noted. No acute abnormalities are present.  The paranasal sinuses and mastoid air cells are clear. IMPRESSION: 1. Normal CT appearance of the brain. No acute abnormalities to explain seizure or altered mental status. 2. 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 Electronically Signed   By: San Morelle M.D.   On: 11/13/2018 06:09    Assessment and Plan:   Chest pain -Atypical chest pain, localized, mostly constant with intermittent sharp pain possibly with deep breathing.  -Normal coronary arteries by LHC in 02/2013. Normal echo in 01/2018 -High sensitivity troponins have been normal with no significant change over time, 5,  6.0,  4.0,  3.0 -Lipid panel: TC 176, HDL 77, LDL 72, trig 134- Good profile. Pt probably does not eat as much as he should as he drinks heavily.  -CXR-  no active cardiopulmonary disease -UDS positive for cocaine -EKG with  non-specific T wave abn in lateral leads, V4-6 which have been intermittently present in the past. -Physical exam is benign. No s/s of heart failure.  -CVD risk factors include smoking, HTN, substance abuse -Patient not given nitroglycerin due to Viagra use.  No aspirin due to possible GI bleed. -Still has constant mild left lower chest aching. With length of pain, the fact that HS troponins not elevated suggests chest pain is not cardiac in nature. EKG non-ischemic.  Acute kidney injury SCr 2.19 (was 0.81 on 10/15/2018).  ALT should be due to dehydration.  IV fluids given.  Continue to monitor renal function. K+ 3.8  Hypertension -Home meds include hydralazine, metoprolol tartrate 100 mg daily. Continued.  -BP fairly well controlled.   Substance-abuse -UDS positive for cocaine -Reportedly daily alcohol use but last drink was on Sunday.  Blood alcohol level less than 10.  On CIWA protocol.  -Advise cessation  Seizure activity -CT of the head was normal.  Etiology of seizure unclear, felt to possibly be related to alcohol withdrawal as his alcohol level was  less than 10.  Tobacco abuse -~1/2 PPD smoker. Pt advised on attempting cessation to reduce CV risk.   Abdominal pain -LUQ/side pain. Being investigated by primary team.  Patient has a history of abdominal pain due to chronic pancreatitis.  Patient reported blood in vomitus.  Hemoglobin stable at 12.5.  Patient is continued on Protonix and Carafate.  FOBT ordered.  For questions or updates, please contact [place] Please consult [email or web address] for contact info under   Signed, Daune Perch, NP  11/13/2018 7:39 AM  History and all data above reviewed.  Patient examined.  I agree with the findings as above.   This is a difficult situation.  The patient has polysubstance abuse.  He is very somnolent and difficult to understand.  He reports pain in his back, abdomen, chest and neck.  He seems to localize mostly to his abdomen.  He has had a few doses of Ativan and Percocet since since admission and is not able to stay awake through the interview.  The patient exam reveals COR:RRR  ,  Lungs: Clear  ,  Abd: Positive bowel sounds, no rebound no guarding, Ext no edema  .  All available labs, radiology testing, previous records reviewed. Agree with documented assessment and plan. Chest pain:  hsTrop argues against an ACS as does the lack of acute EKG changes and atypical symptoms.  No further cardiac work up.   Please call with further questions.   Jeneen Rinks Cambreigh Dearing  12:44 PM  11/13/2018

## 2018-11-13 NOTE — H&P (Signed)
History and Physical    [name] ZOX:[id number] DOB: [birth year] DOA: 11/13/2018  Referring MD/NP/PA:   PCP: Marliss Coots, NP   Patient coming from:  The patient is coming from home.  At baseline, pt is independent for most of ADL.        Chief Complaint: chest pain and abdominal pain, seizure  HPI: [name] is a 48 y.o. male with medical history significant of hypertension, asthma, WPW, sickle cell trait, anxiety, PTSD, bipolar disorder, migraine headache, gastric ulcer disease, polysubstance abuse (cocaine, alcohol, tobacco), CKD stage III, chronic abdominal pain due to chronic pancreatitis, who presents with chest pain, abdominal pain, seizure.  Patient states that his chest pain started since yesterday morning at about 11 AM, which is located in mid and left side of chest, burning-like pain, 7 out of 10 in severity, radiating to the left jaw and left shoulder.  Associated with shortness of breath on exertion.  No cough, fever or chills.  Patient states that he used Viagra at about 12:00 in noon.  He also reports abdominal pain, which started in the evening, constant, moderate, diffused burning-like pain.  Associated with nausea, vomiting and diarrhea.  Patient states that he has vomited 4 times with little blood in the vomitus.  He also reports many episodes of loose stool bowel movement.  Denies symptoms of UTI. Initially, pt did not have neurologic complaints or symptoms. Per report, later on, pt wanted to leave on AMA, but he developed 1 episode of seizure in ED which lasted for about 30 seconds. He does not remember what happened after woke up.  ED Course: pt was found to have WBC 12.2, negative troponin, pending FOBT, alcohol level less than 10, negatrive COVID-19 test, AKI with creatinine 2.19, BUN 16, lipase 67, temperature normal, blood pressure 136/98, tachycardia, oxygen saturation 100% on room air, negative chest x-ray.  Pending CT head. Pt is placed on  tele bed for obs.   Review of Systems:   General: no fevers, chills, no body weight gain, has fatigue HEENT: no blurry vision, hearing changes or sore throat Respiratory: has dyspnea, no coughing, wheezing CV: has chest pain, no palpitations GI: has nausea, vomiting, abdominal pain, diarrhea, no constipation GU: no dysuria, burning on urination, increased urinary frequency, hematuria  Ext: no leg edema Neuro: no unilateral weakness, numbness, or tingling, no vision change or hearing loss. Has seizure. Skin: no rash, no skin tear. MSK: No muscle spasm, no deformity, no limitation of range of movement in spin Heme: No easy bruising.  Travel history: No recent long distant travel.  Allergy:  Allergies  Allergen Reactions  . Robaxin [Methocarbamol] Other (See Comments)    "jumpy limbs"  . Shellfish-Derived Products Nausea And Vomiting  . Trazodone Other (See Comments)    Muscle spasms  . Trazodone And Nefazodone Other (See Comments)    Muscle spasms  . Adhesive [Tape] Itching  . Latex Itching  . Toradol [Ketorolac Tromethamine] Other (See Comments)    Has ulcers; cannot have this  . Contrast Media [Iodinated Diagnostic Agents] Hives  . Reglan [Metoclopramide] Other (See Comments)    Muscle spasms    Past Medical History:  Diagnosis Date  . Alcoholism /alcohol abuse ([place])   . Anemia   . Anxiety   . Arthritis    "knees; arms; elbows" (03/26/2015)  . Asthma   . Bipolar disorder ([place])   . Chronic bronchitis ([place])   . Chronic lower back pain   . Chronic  pancreatitis ([place])   . Cocaine abuse ([place])   . Depression   . Family history of adverse reaction to anesthesia    "grandmother gets confused"  . Femoral condyle fracture ([place]) 03/08/2014   left medial/notes 03/09/2014  . GERD (gastroesophageal reflux disease)   . H/O hiatal hernia   . H/O suicide attempt 10/2012  . Heart murmur    "when he was little" (03/06/2013)  . High cholesterol   . History of blood transfusion  10/2012   "when I tried to commit suicide"  . History of stomach ulcers   . Hypertension   . Marijuana abuse, continuous   . Migraine    "a few times/year" (03/26/2015)  . Pneumonia 1990's X 3  . PTSD (post-traumatic stress disorder)   . Shortness of breath    "can happen at anytime" (03/06/2013)  . Sickle cell trait ([place])   . WPW (Wolff-Parkinson-White syndrome)    [name] 03/06/2013    Past Surgical History:  Procedure Laterality Date  . BIOPSY  11/25/2017   Procedure: BIOPSY;  Surgeon: Arta Silence, MD;  Location: [facility] ENDOSCOPY;  Service: Endoscopy;;  . BIOPSY  10/14/2018   Procedure: BIOPSY;  Surgeon: Arta Silence, MD;  Location: [place];  Service: Endoscopy;;  . CARDIAC CATHETERIZATION    . ESOPHAGOGASTRODUODENOSCOPY (EGD) WITH PROPOFOL N/A 11/25/2017   Procedure: ESOPHAGOGASTRODUODENOSCOPY (EGD) WITH PROPOFOL;  Surgeon: Arta Silence, MD;  Location: [place];  Service: Endoscopy;  Laterality: N/A;  . ESOPHAGOGASTRODUODENOSCOPY (EGD) WITH PROPOFOL Left 10/14/2018   Procedure: ESOPHAGOGASTRODUODENOSCOPY (EGD) WITH PROPOFOL;  Surgeon: Arta Silence, MD;  Location: [facility] ENDOSCOPY;  Service: Endoscopy;  Laterality: Left;  . EYE SURGERY Left 1990's   "result of trauma"   . FACIAL FRACTURE SURGERY Left 1990's   "result of trauma"   . FRACTURE SURGERY    . HERNIA REPAIR    . LEFT HEART CATHETERIZATION WITH CORONARY ANGIOGRAM Right 03/07/2013   Procedure: LEFT HEART CATHETERIZATION WITH CORONARY ANGIOGRAM;  Surgeon: [name], MD;  Location: [place] CATH LAB;  Service: Cardiovascular;  Laterality: Right;  . UMBILICAL HERNIA REPAIR      Social History:  reports that he has been smoking cigarettes and e-cigarettes. He has a 33.00 pack-year smoking history. He has never used smokeless tobacco. He reports current alcohol use. He reports current drug use. Drugs: Marijuana and Cocaine.  Family History:  Family History  Problem Relation Age of Onset  . Hypertension Mother    . Cirrhosis Mother   . Alcoholism Mother   . Hypertension Father   . Melanoma Father   . Hypertension Other   . Coronary artery disease Other      Prior to Admission medications   Medication Sig Start Date End Date Taking? Authorizing Provider  amitriptyline (ELAVIL) 25 MG tablet Take 1 tablet (25 mg total) by mouth at bedtime. 10/15/18   [name], MD  Cyanocobalamin (VITAMIN B-12 PO) Take 1 tablet by mouth daily.    [provider]  folic acid (FOLVITE) 1 MG tablet Take 1 tablet (1 mg total) by mouth daily. 11/26/17   [name], MD  gabapentin (NEURONTIN) 100 MG capsule Take 1 capsule (100 mg total) by mouth 2 (two) times daily. 10/15/18   [name], MD  hydrOXYzine (ATARAX/VISTARIL) 25 MG tablet Take 25 mg by mouth 3 (three) times daily.    [provider]  lipase/protease/amylase (CREON) 12000 units CPEP capsule Take 1 capsule (12,000 Units total) by mouth 3 (three) times daily with  meals. For pancreatitis 10/15/18   [name], MD  loratadine (CLARITIN) 10 MG tablet Take 1 tablet (10 mg total) by mouth daily as needed for allergies or rhinitis. (May purchase from over the counter): For allergies 10/01/17   Aline August, MD  Multiple Vitamin (MULTIVITAMIN WITH MINERALS) TABS tablet Take 1 tablet by mouth daily. 09/06/17   Bonnell Public, MD  nicotine (NICODERM CQ - DOSED IN MG/24 HOURS) 21 mg/24hr patch Place 1 patch (21 mg total) onto the skin daily. 10/16/18   [name], MD  pantoprazole (PROTONIX) 40 MG tablet Take 1 tablet (40 mg total) by mouth 2 (two) times daily before a meal. 10/15/18   [name], MD  promethazine (PHENERGAN) 25 MG tablet Take 25 mg by mouth daily as needed for nausea or vomiting.    [provider]  QUEtiapine (SEROQUEL) 100 MG tablet Take 100 mg by mouth at bedtime.    [provider]  sertraline (ZOLOFT) 100 MG tablet Take 1 tablet (100 mg total) by mouth daily. Patient taking differently: Take  150 mg by mouth daily.  08/29/17   Clent Demark, PA-C  sucralfate (CARAFATE) 1 GM/10ML suspension Take 10 mLs (1 g total) by mouth 4 (four) times daily -  with meals and at bedtime. 10/15/18   [name], MD  thiamine 100 MG tablet Take 1 tablet (100 mg total) by mouth daily. 10/16/18   [name], MD    Physical Exam: Vitals:   11/13/18 0307 11/13/18 0330 11/13/18 0501 11/13/18 0506  BP: (!) 136/98 (!) 130/96 (!) 144/101 (!) 144/101  Pulse: 94 95  (!) 105  Resp: 14 17 (!) 22 20  Temp:    97.9 F (36.6 C)  TempSrc:    Oral  SpO2: 100% 100%  100%  Weight:      Height:       General: Not in acute distress HEENT:       Eyes: PERRL, EOMI, no scleral icterus.       ENT: No discharge from the ears and nose, no pharynx injection, no tonsillar enlargement.        Neck: No JVD, no bruit, no mass felt. Heme: No neck lymph node enlargement. Cardiac: S1/S2, RRR, No gallops or rubs. Respiratory:  No rales, wheezing, rhonchi or rubs. GI: Soft, nondistended, diffused tenderness , no rebound pain, no organomegaly, BS present. GU: No hematuria Ext: No pitting leg edema bilaterally. 2+DP/PT pulse bilaterally. Musculoskeletal: No joint deformities, No joint redness or warmth, no limitation of ROM in spin. Skin: No rashes.  Neuro: Alert, oriented X3, cranial nerves II-XII grossly intact, moves all extremities normally.  Psych: Patient is not psychotic, no suicidal or hemocidal ideation.  Labs on Admission: I have personally reviewed following labs and imaging studies  CBC: Recent Labs  Lab 11/13/18 0130  WBC 12.2*  HGB 12.5*  HCT 39.3  MCV 99.7  PLT 970   Basic Metabolic Panel: Recent Labs  Lab 11/13/18 0130  NA 132*  K 3.8  CL 99  CO2 18*  GLUCOSE 88  BUN 16  CREATININE 2.19*  CALCIUM 8.6*   GFR: Estimated Creatinine Clearance: 32.2 mL/min (A) (by C-G formula based on SCr of 2.19 mg/dL (H)). Liver Function Tests: Recent Labs  Lab 11/13/18 0130  AST 56*  ALT  40  ALKPHOS 111  BILITOT 1.1  PROT 8.8*  ALBUMIN 4.6   Recent Labs  Lab 11/13/18 0130  LIPASE 67*  No results for input(s): AMMONIA in the last 168 hours. Coagulation Profile: No results for input(s): INR, PROTIME in the last 168 hours. Cardiac Enzymes: No results for input(s): CKTOTAL, CKMB, CKMBINDEX, TROPONINI in the last 168 hours. BNP (last 3 results) No results for input(s): PROBNP in the last 8760 hours. HbA1C: No results for input(s): HGBA1C in the last 72 hours. CBG: No results for input(s): GLUCAP in the last 168 hours. Lipid Profile: No results for input(s): CHOL, HDL, LDLCALC, TRIG, CHOLHDL, LDLDIRECT in the last 72 hours. Thyroid Function Tests: No results for input(s): TSH, T4TOTAL, FREET4, T3FREE, THYROIDAB in the last 72 hours. Anemia Panel: No results for input(s): VITAMINB12, FOLATE, FERRITIN, TIBC, IRON, RETICCTPCT in the last 72 hours. Urine analysis:    Component Value Date/Time   COLORURINE YELLOW 06/19/2018 [place] 06/19/2018 0747   LABSPEC 1.020 06/19/2018 0747   PHURINE 5.0 06/19/2018 0747   GLUCOSEU NEGATIVE 06/19/2018 0747   HGBUR NEGATIVE 06/19/2018 0747   HGBUR negative 04/30/2010 1020   BILIRUBINUR NEGATIVE 06/19/2018 0747   KETONESUR NEGATIVE 06/19/2018 0747   PROTEINUR NEGATIVE 06/19/2018 0747   UROBILINOGEN 0.2 03/15/2015 0508   NITRITE NEGATIVE 06/19/2018 0747   LEUKOCYTESUR NEGATIVE 06/19/2018 0747   Sepsis Labs: @LABRCNTIP (procalcitonin:4,lacticidven:4) ) Recent Results (from the past 240 hour(s))  SARS Coronavirus 2 (CEPHEID - Performed in [facility] hospital lab), Hosp Order     Status: None   Collection Time: 11/13/18  3:02 AM   Specimen: Nasopharyngeal Swab  Result Value Ref Range Status   SARS Coronavirus 2 NEGATIVE NEGATIVE Final    Comment: (NOTE) If result is NEGATIVE SARS-CoV-2 target nucleic acids are NOT DETECTED. The SARS-CoV-2 RNA is generally detectable in upper and lower  respiratory  specimens during the acute phase of infection. The lowest  concentration of SARS-CoV-2 viral copies this assay can detect is 250  copies / mL. A negative result does not preclude SARS-CoV-2 infection  and should not be used as the sole basis for treatment or other  patient management decisions.  A negative result may occur with  improper specimen collection / handling, submission of specimen other  than nasopharyngeal swab, presence of viral mutation(s) within the  areas targeted by this assay, and inadequate number of viral copies  (<250 copies / mL). A negative result must be combined with clinical  observations, patient history, and epidemiological information. If result is POSITIVE SARS-CoV-2 target nucleic acids are DETECTED. The SARS-CoV-2 RNA is generally detectable in upper and lower  respiratory specimens dur ing the acute phase of infection.  Positive  results are indicative of active infection with SARS-CoV-2.  Clinical  correlation with patient history and other diagnostic information is  necessary to determine patient infection status.  Positive results do  not rule out bacterial infection or co-infection with other viruses. If result is PRESUMPTIVE POSTIVE SARS-CoV-2 nucleic acids MAY BE PRESENT.   A presumptive positive result was obtained on the submitted specimen  and confirmed on repeat testing.  While 2019 novel coronavirus  (SARS-CoV-2) nucleic acids may be present in the submitted sample  additional confirmatory testing may be necessary for epidemiological  and / or clinical management purposes  to differentiate between  SARS-CoV-2 and other Sarbecovirus currently known to infect humans.  If clinically indicated additional testing with an alternate test  methodology [phone]) is advised. The SARS-CoV-2 RNA is generally  detectable in upper and lower respiratory sp ecimens during the acute  phase of infection. The expected result is  Negative. Fact Sheet for  Patients:  StrictlyIdeas.no Fact Sheet for Healthcare Providers: BankingDealers.co.za This test is not yet approved or cleared by the [place] FDA and has been authorized for detection and/or diagnosis of SARS-CoV-2 by FDA under an Emergency Use Authorization (EUA).  This EUA will remain in effect (meaning this test can be used) for the duration of the COVID-19 declaration under Section 564(b)(1) of the Act, 21 U.S.C. section 360bbb-3(b)(1), unless the authorization is terminated or revoked sooner. Performed at [facility], [place] [address], [place], [place] [address]      Radiological Exams on Admission: Dg Chest 2 View  Result Date: 11/13/2018 CLINICAL DATA:  Chest pain. EXAM: CHEST - 2 VIEW COMPARISON:  July 09, 2018 FINDINGS: The previously demonstrated right middle lobe pneumonia has substantially improved. There are a few chronic appearing airspace opacities bilaterally. There is no pneumothorax. No large pleural effusion. There is no acute osseous abnormality. The heart size is normal. IMPRESSION: No active cardiopulmonary disease. Electronically Signed   By: Constance Holster M.D.   On: 11/13/2018 01:17     EKG: Independently reviewed.  Sinus rhythm, QTC 463, LAE, mild T wave inversion in V4-V5  Assessment/Plan Principal Problem:   Atypical chest pain Active Problems:   TOBACCO ABUSE   [name] Parkinson White pattern seen on electrocardiogram   Benign essential HTN   Polysubstance abuse (tobacco, cocaine, THC, and ETOH)   Chronic pancreatitis (HCC)   Leukocytosis   Major depressive disorder, recurrent severe without psychotic features ([place])   Alcohol use disorder, severe, dependence ([place])   Abdominal pain   AKI (acute kidney injury) ([place])   Seizure ([place])   Atypical chest pain: Patient described his chest pain as burning-like pain.  Initial troponin negative.  EKG showed mild T wave inversion in V4-V5.   May be related to cocaine abuse.  - will place on Tele bed for obs - prn percocet for pain (no IV narcotics due to cocaine abuse) - Trend Trop - Repeat EKG in the am  - No NGT due to Viagra use - No ASA due to possible GBI - Risk factor stratification: will check FLP and A1C  - check UDS - inpt card consult was requested via Epic  Tobacco abuse and Alcohol abuse: -Did counseling about importance of quitting smoking and alcohol -Nicotine patch -CIWA protocol  [name] Parkinson White pattern seen on electrocardiogram: pt is on Metoprolol at home. Ideally should avoid BB or node blockers. -tele monitoring  Benign essential HTN: -continue hydralazine and metoprolol -IV hydralazine as needed  Polysubstance abuse (tobacco, cocaine, THC, and ETOH): -check UDS  Abdominal pain and chronic pancreatitis [facility]): Patient's abdominal pain is most likely due to chronic pancreatitis.  Lipase 67 today. He reports little blood in the vomitus. Hgb stable 12.5 -Continue Creon -on protonix and carafate -check FOBT  Leukocytosis:  WBC  12.2. No fever or signs of infection, likely due to reactive response -f/u by CBC  Major depressive disorder, recurrent severe without psychotic features ([place]): -continue home meds: Seroquel, Zoloft, amitriptyline  AKI: Likely due to dehydration - IVF: 1L NS, then 125 cc/h - Follow up renal function by BMP  Seizure: Etiology is not clear, may be due to alcohol withdrawal.  His alcohol level is less than 10.  Substance abuse may have also contributed -Seizure precaution -When necessary Ativan for seizure -CT-head and EEG -hold of tramadol    DVT ppx: SCD Code Status: Full code Family Communication: None at bed side.  Disposition Plan:  Anticipate discharge back to previous home environment Consults called:  none Admission status: Obs / tele    Date of Service 11/13/2018    [facility]   If 7PM-7AM, please contact  night-coverage [email or web address] Password [email or web address] 11/13/2018, 5:21 AM

## 2018-11-13 NOTE — ED Notes (Signed)
Pt took all of his leads off and IV out and wanted to leave AMA, pt was asked to wait for the doctor, started to walk out of the room and fell and started to seize

## 2018-11-13 NOTE — ED Notes (Signed)
Pt complaining of the worst pain of his life in his back, stomach and chest. Pt on cardiac monitoring at this time. Pt requesting morphine.

## 2018-11-13 NOTE — ED Notes (Signed)
Dr.Niu notified of pt having seizure activity and was placed back in bed. Additional orders to be given.

## 2018-11-13 NOTE — ED Notes (Signed)
Pt transported to CT ?

## 2018-11-13 NOTE — ED Notes (Signed)
Pt found out of bed again with no assistance, pt removed monitors and attempting to leave the room. Pt confused and has unsteady gait while ambulating. Pt does not believe he needs to stay in the hospital.

## 2018-11-13 NOTE — ED Notes (Signed)
Pt found with IV removed (by pt) and clothes back on, gown in the floor and belongings packed. Pt stated he was leaving and did not want to stay. Pt asked to wait to speak with admitting MD before leaving.

## 2018-11-13 NOTE — ED Notes (Signed)
Pt disconnected himself from cardiac monitoring and brought himself to the nurses station to speak with RN and have RN "fix" his IV pump. Pt knows to call staff and has used call bell previously to speak with staff. Pt has been asked to not get out of bed without assistance previously. Pt back in room with Blaine Hamper, MD at bedside.

## 2018-11-13 NOTE — ED Notes (Signed)
Pt standing up out of bed attempting to walk around room pt escorted back into bed and instructed to stay in bed pt still confused.

## 2018-11-13 NOTE — ED Notes (Signed)
Pt able to ambulate from bed to wheel chair for assistance moving in ED. Pt has steady gait.

## 2018-11-13 NOTE — ED Triage Notes (Signed)
Pt BIB [facility] from home c/o chest and abdominal pain. He states that the pain radiates from his L jaw to his L chest. States that the chest pain started around 11a and the abdominal pain around 11p. He reports taking viagra around the same time that the chest pain started this morning. Endorses some SOB with exertion.

## 2018-11-14 ENCOUNTER — Observation Stay (HOSPITAL_COMMUNITY): Payer: Self-pay | Admitting: Anesthesiology

## 2018-11-14 ENCOUNTER — Encounter (HOSPITAL_COMMUNITY): Payer: Self-pay | Admitting: Anesthesiology

## 2018-11-14 ENCOUNTER — Encounter (HOSPITAL_COMMUNITY)
Admission: EM | Disposition: A | Discharge status: Home / Self Care | Payer: Self-pay | Source: Home / Self Care | Attending: Emergency Medicine

## 2018-11-14 ENCOUNTER — Observation Stay (HOSPITAL_COMMUNITY)
Admit: 2018-11-14 | Discharge: 2018-11-14 | Disposition: A | Discharge status: Home / Self Care | Payer: Self-pay | Attending: Internal Medicine | Admitting: Internal Medicine

## 2018-11-14 DIAGNOSIS — R569 Unspecified convulsions: Secondary | ICD-10-CM

## 2018-11-14 HISTORY — PX: ESOPHAGOGASTRODUODENOSCOPY (EGD) WITH PROPOFOL: SHX5813

## 2018-11-14 LAB — BASIC METABOLIC PANEL
Anion gap: 8 (ref 5–15)
BUN: 10 mg/dL (ref 6–20)
CO2: 19 mmol/L — ABNORMAL LOW (ref 22–32)
Calcium: 7.6 mg/dL — ABNORMAL LOW (ref 8.9–10.3)
Chloride: 111 mmol/L (ref 98–111)
Creatinine, Ser: 0.75 mg/dL (ref 0.61–1.24)
GFR calc Af Amer: >60 mL/min (ref >60)
GFR calc non Af Amer: >60 mL/min (ref >60)
Glucose, Bld: 46 mg/dL — ABNORMAL LOW (ref 70–99)
Potassium: 3.5 mmol/L (ref 3.5–5.1)
Sodium: 138 mmol/L (ref 135–145)

## 2018-11-14 LAB — CBC
HCT: 31.1 % — ABNORMAL LOW (ref 39.0–52.0)
Hemoglobin: 9.9 g/dL — ABNORMAL LOW (ref 13.0–17.0)
MCH: 32.8 pg (ref 26.0–34.0)
MCHC: 31.8 g/dL (ref 30.0–36.0)
MCV: 103 fL — ABNORMAL HIGH (ref 80.0–100.0)
Platelets: 130 10*3/uL — ABNORMAL LOW (ref 150–400)
RBC: 3.02 MIL/uL — ABNORMAL LOW (ref 4.22–5.81)
RDW: 14.3 % (ref 11.5–15.5)
WBC: 6.8 10*3/uL (ref 4.0–10.5)
nRBC: 0 % (ref 0.0–0.2)

## 2018-11-14 LAB — GLUCOSE, CAPILLARY
Glucose-Capillary: 151 mg/dL — ABNORMAL HIGH (ref 70–99)
Glucose-Capillary: 42 mg/dL — CL (ref 70–99)
Glucose-Capillary: 203 mg/dL — ABNORMAL HIGH (ref 70–99)

## 2018-11-14 SURGERY — ESOPHAGOGASTRODUODENOSCOPY (EGD) WITH PROPOFOL
Anesthesia: Monitor Anesthesia Care

## 2018-11-14 MED ORDER — DEXTROSE-NACL 5-0.9 % IV SOLN: INTRAVENOUS | Status: DC

## 2018-11-14 MED ORDER — PRO-STAT SUGAR FREE PO LIQD
30.0000 mL | Freq: Two times a day (BID) | ORAL | Status: DC
  Filled 2018-11-14: qty 30

## 2018-11-14 MED ORDER — BOOST / RESOURCE BREEZE PO LIQD CUSTOM
1.0000 | Freq: Two times a day (BID) | ORAL | Status: DC
  Administered 2018-11-14: 1 via ORAL
  Filled 2018-11-14 (×3): qty 1

## 2018-11-14 MED ORDER — ENSURE ENLIVE PO LIQD
237.0000 mL | Freq: Two times a day (BID) | ORAL | Status: DC
  Filled 2018-11-14 (×4): qty 237

## 2018-11-14 MED ORDER — SODIUM CHLORIDE 0.9 % IV SOLN: INTRAVENOUS | Status: DC

## 2018-11-14 MED ORDER — PROPOFOL 500 MG/50ML IV EMUL
INTRAVENOUS | Status: DC | PRN
  Administered 2018-11-14: 30 mg via INTRAVENOUS
  Administered 2018-11-14: 40 mg via INTRAVENOUS

## 2018-11-14 MED ORDER — PANTOPRAZOLE SODIUM 40 MG PO TBEC: 40.0000 mg | DELAYED_RELEASE_TABLET | Freq: Every day | ORAL | Status: DC

## 2018-11-14 MED ORDER — DEXTROSE 50 % IV SOLN
INTRAVENOUS | Status: AC
  Filled 2018-11-14: qty 50

## 2018-11-14 MED ORDER — LACTATED RINGERS IV SOLN
INTRAVENOUS | Status: DC
  Administered 2018-11-14: 1000 mL via INTRAVENOUS

## 2018-11-14 MED ORDER — MIDAZOLAM HCL 5 MG/5ML IJ SOLN
INTRAMUSCULAR | Status: DC | PRN
  Administered 2018-11-14: 2 mg via INTRAVENOUS

## 2018-11-14 MED ORDER — PROPOFOL 500 MG/50ML IV EMUL
INTRAVENOUS | Status: DC | PRN
  Administered 2018-11-14: 125 ug/kg/min via INTRAVENOUS

## 2018-11-14 MED ORDER — MIDAZOLAM HCL 2 MG/2ML IJ SOLN
INTRAMUSCULAR | Status: AC
  Filled 2018-11-14: qty 2

## 2018-11-14 MED ORDER — PROPOFOL 10 MG/ML IV BOLUS
INTRAVENOUS | Status: AC
  Filled 2018-11-14: qty 40

## 2018-11-14 MED ORDER — DEXTROSE 50 % IV SOLN
25.0000 g | INTRAVENOUS | Status: AC
  Administered 2018-11-14: 11:00:00 25 g via INTRAVENOUS

## 2018-11-14 SURGICAL SUPPLY — 15 items

## 2018-11-14 NOTE — Progress Notes (Signed)
Patient ate a cheeseburger with 2 milks and 2 Cokes since EGD. No complications- tolerated well.

## 2018-11-14 NOTE — Procedures (Signed)
History: 48 year old male being evaluated for seizures with heavy alcohol abuse and cocaine abuse  Sedation: None  Technique: This is a 21 channel routine scalp EEG performed at the bedside with bipolar and monopolar montages arranged in accordance to the international 10/20 system of electrode placement. One channel was dedicated to EKG recording.    Background: The background consists of intermixed alpha and beta activities. There is a well defined posterior dominant rhythm of 9 hz that attenuates with eye opening.  With drowsiness, there is anterior shifting of the posterior dominant rhythm and a mild increase in slow activity, but sleep is not recorded.  Photic stimulation: Physiologic driving is not performed  EEG Abnormalities: None  Clinical Interpretation: This normal EEG is recorded in the waking and drowsy state. There was no seizure or seizure predisposition recorded on this study. Please note that lack of epileptiform activity on EEG does not preclude the possibility of epilepsy.   Roland Rack, MD [facility] [phone]  If 7pm- 7am, please page neurology on call as listed in [place].

## 2018-11-14 NOTE — Progress Notes (Signed)
Pt refusing bed alarm, adamant that he is steady enough to walk alone. Constantly asking for his bags. Pt has his belongings scattered all over his room, his sheets/blankets thrown in the floor. [name] room smells like smoke but pt denies smoking. Lighter found on bedside table and confiscated until D/C. Cigarette butts floating in pt's toilet. MD made aware of findings. Pt aware he is to be D/C'd soon once EEG results are in.

## 2018-11-14 NOTE — Op Note (Signed)
[facility] Patient Name: [name] Procedure Date: 11/14/2018 MRN: [id number] Attending MD: Nancy Fetter Dr., MD Date of Birth: Mar 24, 1971 CSN: [id number] Age: 48 Admit Type: Inpatient Procedure:                Upper GI endoscopy Indications:              Hematemesis, Peptic ulcer Providers:                Jeneen Rinks L. Blyss Lugar Dr., MD, Grace Isaac, RN, Elspeth Cho Tech., Technician, Arnoldo Hooker, CRNA Referring MD:              Medicines:                Monitored Anesthesia Care Complications:            No immediate complications. Estimated Blood Loss:     Estimated blood loss: none. Procedure:                Pre-Anesthesia Assessment:                           - Prior to the procedure, a History and Physical                            was performed, and patient medications and                            allergies were reviewed. The patient's tolerance of                            previous anesthesia was also reviewed. The risks                            and benefits of the procedure and the sedation                            options and risks were discussed with the patient.                            All questions were answered, and informed consent                            was obtained. Prior Anticoagulants: The patient has                            taken no previous anticoagulant or antiplatelet                            agents. ASA Grade Assessment: IV - A patient with                            severe systemic disease that is a constant threat  to life. After reviewing the risks and benefits,                            the patient was deemed in satisfactory condition to                            undergo the procedure.                           After obtaining informed consent, the endoscope was                            passed under direct vision. Throughout the   procedure, the patient's blood pressure, pulse, and                            oxygen saturations were monitored continuously. The                            GIF-H190 ([id number]) Olympus gastroscope was                            introduced through the mouth, and advanced to the                            second part of duodenum. The upper GI endoscopy was                            accomplished without difficulty. The patient                            tolerated the procedure well. Scope In: Scope Out: Findings:      There is no endoscopic evidence of bleeding, esophagitis, stricture,       ulcerations or varices in the entire esophagus.      There is no endoscopic evidence of bleeding or ulceration in the entire       examined stomach.      The examined duodenum was normal. Impression:               - Normal examined duodenum.                           - No specimens collected.                           - Hematemesis without active bleeding currently.                            Ulcers seen on previous endoscopy have completely                            healed. Suspect his nausea and vomiting were from                            pancreatitis Moderate Sedation:      See  anesthesia note, no moderate sedation. Recommendation:           - Return patient to hospital ward for ongoing care.                           - Use Protonix (pantoprazole) 40 mg PO daily. We                            will go ahead and stop Carafate since no active                            ulcers. Procedure Code(s):        --- Professional ---                           [phone], Esophagogastroduodenoscopy, flexible,                            transoral; diagnostic, including collection of                            specimen(s) by brushing or washing, when performed                            (separate procedure) Diagnosis Code(s):        --- Professional ---                           K92.0, Hematemesis                            K27.9, Peptic ulcer, site unspecified, unspecified                            as acute or chronic, without hemorrhage or                            perforation CPT copyright 2019 [employer]. All rights reserved. The codes documented in this report are preliminary and upon coder review may  be revised to meet current compliance requirements. Nancy Fetter Dr., MD 11/14/2018 12:21:45 PM This report has been signed electronically. Number of Addenda: 0

## 2018-11-14 NOTE — Interval H&P Note (Signed)
History and Physical Interval Note:  11/14/2018 11:54 AM  [name]  has presented today for surgery, with the diagnosis of hematemesis.  The various methods of treatment have been discussed with the patient and family. After consideration of risks, benefits and other options for treatment, the patient has consented to  Procedure(s): ESOPHAGOGASTRODUODENOSCOPY (EGD) WITH PROPOFOL (N/A) as a surgical intervention.  The patient's history has been reviewed, patient examined, no change in status, stable for surgery.  I have reviewed the patient's chart and labs.  Questions were answered to the patient's satisfaction.     Nancy Fetter

## 2018-11-14 NOTE — Progress Notes (Signed)
[name] Julias Mould notified to complete consent for today's scheduled EGD. Pt alert and oriented to self, knew he is at "[facility]" hospital, but believed year is "22". RN asked pt if he knew he was going to have a procedure done today. Pt stated "The gastroenterologist is going to put a scope down to my stomach." Pt verbalized consent for this procedure, but was also confused. Mother verified consent with a 2nd RN also.

## 2018-11-14 NOTE — Anesthesia Postprocedure Evaluation (Signed)
Anesthesia Post Note  Patient: [name]  Procedure(s) Performed: ESOPHAGOGASTRODUODENOSCOPY (EGD) WITH PROPOFOL (N/A )     Patient location during evaluation: Endoscopy Anesthesia Type: MAC Level of consciousness: awake and alert Pain management: pain level controlled Vital Signs Assessment: post-procedure vital signs reviewed and stable Respiratory status: spontaneous breathing, nonlabored ventilation, respiratory function stable and patient connected to nasal cannula oxygen Cardiovascular status: blood pressure returned to baseline and stable Postop Assessment: no apparent nausea or vomiting Anesthetic complications: no    Last Vitals:  Vitals:   11/14/18 1231 11/14/18 1259  BP: 112/75 118/86  Pulse: 79 76  Resp: (!) 22 18  Temp:  36.4 C  SpO2: 100% 94%    Last Pain:  Vitals:   11/14/18 1309  TempSrc:   PainSc: 6                  Barnet Glasgow

## 2018-11-14 NOTE — Discharge Summary (Signed)
Physician Discharge Summary  Mcdaniel Ohms KDX:[id number] DOB: [birth year] DOA: 11/13/2018  PCP: Marliss Coots, NP  Admit date: 11/13/2018 Discharge date: 11/14/2018  Admitted From: HOME Disposition:  HOME  Recommendations for Outpatient Follow-up:  1. Follow up with PCP in 1-2 weeks 2. Please obtain BMP/CBC in one week 3. Please follow up on the following pending results:  Home Health:NO  Equipment/Devices:NO  Discharge Condition: Stable CODE STATUS: FULL Diet recommendation: Heart Healthy,  Brief/Interim Summary:  Brief narrative 48 y.o.malewith medical history significant ofhypertension, asthma, WPW, sickle cell trait, anxiety, PTSD, bipolar disorder, migraine headache, gastric ulcer disease, polysubstance abuse (cocaine, alcohol, tobacco), CKD stage III, chronic abdominal pain due to chronic pancreatitis, p/w chest pain, abdominal pain, seizure. CP since 7/6 morning at about 11 AM, which is located in mid and left side of chest,burning-like pain, /10, radiating to the left jaw and left shoulder w DOE. Used Viagra at about 12:00 in noon.  Also c/o abdominal pain started in the evening, constant, moderate, diffused burning-like pain with nausea,vomiting and diarrhea, vomited 4 times with little blood in the vomitus. He also reports many episodes of loose stool bowel movement. Denies symptoms of UTI. Initially, pt did not haveneurologic complaints or symptoms. Per report, later on, pt wanted to leave on AMA, but hedeveloped 1 episode of seizurein EDwhich lasted for about 30 seconds. Hedoes not remember what happened after he woke up.  In ER, WBC 12.2, troponin NEG, pending FOBT, alcohol level less than 10,negatriveCOVID-19 test, AKI with creatinine 2.19, BUN 16, lipase 67,  Vitals stable with tachycardia,negative chest x-ray, Neg CT head. Was admitted for further workup, was seen by cardiology, gastroenterology. Underwent EGD which is unremarkable.  Seen by  cardiology and chest pain was atypical. Diet was advanced today he tolerated well. His EEG came back unremarkable. He is being discharged home in medically stable condition.  Issues addressed Discharge Diagnoses:   Chest pain, atypical- neg serial HS trop. cardaic cath in 2014 by Dr Drue Dun he does not f/u with any cardio as o/p- cardio consulted here in the saw the patient-and failed that this is atypical pain and advised no further work-up.resume home meds  Abdominal pain with Nausea/Vomitting/Diarrhea- had chronic abd pain, lipase slightly up. lfts stable last ct abd 6/7- chronic calcific pancreatitis. Continue supportive care.  Diet was advanced and he tolerated well- ate chicken burger, was asking for [place] fries too.  Blood in vomitus/Upper GI bleed: last EGD 10/14/18 Dr Paulita Fujita with "Non-bleeding gastric ulcer with no stigmata of bleeding"- placed on PPI bid and advised o/p follow up in 4-6 wk.H/H -12.5 gm now in 9 gm, but post ivf.  He was done today by GI and previous ulcer has healed, no acute finding.  GI advised this is likely from his chronic pancreatitis advised to continue PPI and discharge once tolerates diet.  Seizure episode x1 in ER, suspect due to etoh withdrawal or cocaine use- which can provoke seizure. He drinks 6 pack beer/day- last drink 2 days PTA.CT head neg. EEG was done and negative for any seizure. He is advised to Stop Tramadol, stop alcohol use and Cocaine use . Outpatient follow-up with PCP. Again discussed about alcohol cessation.  He does not have any tremor currently and is alert awake, ambulating (apparently also smoking in the room per nursing report"- he is instructed - NO MOTOR VEHICLE DRIVING UNLESS HE STOPS DRINKING ALCOHOL AND USING COCAINE. I discussed with Dr Leonel Ramsay for neurology- advised f/u w PCP.  Alcohol abuse  with withdrawal.  Currently no tremors withdrawal symptoms symptoms improved. Discussed about alcohol cessation.  Hypoglycemia likely  from n.p.o. patient ate well and rechecked blood sugar after meal and was in 200s  AKI creat at 2.19-- improved to 0.7 w ivf. D/c ivf  COCAINE ABUSE- uds+ discussed about cessation, strongly encouraged. Leucocytosis: Resolved at this time.  Likely reactive. COPD: Not in exacerbation, continue home inhalers. HTN: Blood pressure- BP soft post egd/anesthesia related. Now improved. Bipolar disorder/PTSD: Mood is stable continue home meds  CODE STATUS full Family consent discussed with patient in detail. Disposition: Discharge home once tolerates diet.  Procedure:  EGD 11/14/18: Previous gastric ulcer as well.  No evidence of bleeding with normal esophagus and duodenum.  DGL:OVFI normal EEG is recorded in the waking and drowsy state. There was no seizure or seizure predisposition recorded on this study. Please   Cardiac cath 03/07/2013  1. Normal left main coronary artery. 2. Normal left anterior descending artery and its branches. 3. Normal left circumflex artery and its branches. 4. Normal right coronary artery. 5. Normal left ventricular systolic function. LVEDP 11 mmHg. Ejection fraction 70%.     Discharge Instructions  Discharge Instructions    Diet - low sodium heart healthy   Complete by: As directed    Discharge instructions   Complete by: As directed    Please call call MD or return to ER for similar or worsening recurring problem that brought you to hospital or if any fever,nausea/vomiting,abdominal pain, uncontrolled pain, chest pain,  shortness of breath or any other alarming symptoms.  Please follow-up your doctor as instructed in a week time and call the office for appointment.  As per [place]: PLEASE DO NOT [place] until you stop using alcohol and cocaine and seizure free for 6-12 months . Alcohol and Cocaine can provoke seizure- so please stop using them.  Please avoid alcohol, smoking, or any other illicit substance and maintain healthy  habits including taking your regular medications as prescribed.  You were cared for by a hospitalist during your hospital stay. If you have any questions about your discharge medications or the care you received while you were in the hospital after you are discharged, you can call the unit and ask to speak with the hospitalist on call if the hospitalist that took care of you is not available.  Once you are discharged, your primary care physician will handle any further medical issues. Please note that NO REFILLS for any discharge medications will be authorized once you are discharged, as it is imperative that you return to your primary care physician (or establish a relationship with a primary care physician if you do not have one) for your aftercare needs so that they can reassess your need for medications and monitor your lab values   Increase activity slowly   Complete by: As directed      Allergies as of 11/14/2018      Reactions   Robaxin [methocarbamol] Other (See Comments)   "jumpy limbs"   Shellfish-derived Products Nausea And Vomiting   Trazodone Other (See Comments)   Muscle spasms   Trazodone And Nefazodone Other (See Comments)   Muscle spasms   Adhesive [tape] Itching   Latex Itching   Toradol [ketorolac Tromethamine] Other (See Comments)   Has ulcers; cannot have this   Contrast Media [iodinated Diagnostic Agents] Hives   Reglan [metoclopramide] Other (See Comments)   Muscle spasms      Medication List  STOP taking these medications   sucralfate 1 GM/10ML suspension Commonly known as: CARAFATE   traMADol 50 MG tablet Commonly known as: ULTRAM     TAKE these medications   amitriptyline 25 MG tablet Commonly known as: ELAVIL Take 1 tablet (25 mg total) by mouth at bedtime.   folic acid 1 MG tablet Commonly known as: FOLVITE Take 1 tablet (1 mg total) by mouth daily.   gabapentin 100 MG capsule Commonly known as: NEURONTIN Take 1 capsule (100 mg total) by  mouth 2 (two) times daily.   hydrALAZINE 25 MG tablet Commonly known as: APRESOLINE Take 25 mg by mouth daily.   lipase/protease/amylase 12000 units Cpep capsule Commonly known as: CREON Take 1 capsule (12,000 Units total) by mouth 3 (three) times daily with meals. For pancreatitis   loratadine 10 MG tablet Commonly known as: CLARITIN Take 1 tablet (10 mg total) by mouth daily as needed for allergies or rhinitis. (May purchase from over the counter): For allergies What changed: when to take this   LORazepam 0.5 MG tablet Commonly known as: ATIVAN Take 0.5 mg by mouth every 8 (eight) hours as needed for anxiety.   metoprolol tartrate 100 MG tablet Commonly known as: LOPRESSOR Take 100 mg by mouth daily.   multivitamin with minerals Tabs tablet Take 1 tablet by mouth daily.   nicotine 21 mg/24hr patch Commonly known as: NICODERM CQ - dosed in mg/24 hours Place 1 patch (21 mg total) onto the skin daily.   nicotine polacrilex 4 MG gum Commonly known as: NICORETTE Take 4 mg by mouth daily as needed for smoking cessation.   ondansetron 4 MG tablet Commonly known as: ZOFRAN Take 4 mg by mouth every 8 (eight) hours as needed for nausea or vomiting.   oxyCODONE-acetaminophen 5-325 MG tablet Commonly known as: PERCOCET/ROXICET Take 1 tablet by mouth every 8 (eight) hours as needed for severe pain.   pantoprazole 40 MG tablet Commonly known as: PROTONIX Take 1 tablet (40 mg total) by mouth 2 (two) times daily before a meal.   QUEtiapine 100 MG tablet Commonly known as: SEROQUEL Take 100 mg by mouth at bedtime.   sertraline 100 MG tablet Commonly known as: ZOLOFT Take 1 tablet (100 mg total) by mouth daily. What changed: how much to take   thiamine 100 MG tablet Take 1 tablet (100 mg total) by mouth daily.   VITAMIN B-12 PO Take 1 tablet by mouth daily.      Follow-up Information    [name], [name], NP Follow up in 1 week(s).   Contact information: [place] [address] [phone]          Allergies  Allergen Reactions  . Robaxin [Methocarbamol] Other (See Comments)    "jumpy limbs"  . Shellfish-Derived Products Nausea And Vomiting  . Trazodone Other (See Comments)    Muscle spasms  . Trazodone And Nefazodone Other (See Comments)    Muscle spasms  . Adhesive [Tape] Itching  . Latex Itching  . Toradol [Ketorolac Tromethamine] Other (See Comments)    Has ulcers; cannot have this  . Contrast Media [Iodinated Diagnostic Agents] Hives  . Reglan [Metoclopramide] Other (See Comments)    Muscle spasms    Consultations:  NEURO OVER THE PHONE  CARDIO  GI  Procedures/Studies: Dg Chest 2 View  Result Date: 11/13/2018 CLINICAL DATA:  Chest pain. EXAM: CHEST - 2 VIEW COMPARISON:  July 09, 2018 FINDINGS: The previously demonstrated right middle lobe pneumonia has substantially  improved. There are a few chronic appearing airspace opacities bilaterally. There is no pneumothorax. No large pleural effusion. There is no acute osseous abnormality. The heart size is normal. IMPRESSION: No active cardiopulmonary disease. Electronically Signed   By: Constance Holster M.D.   On: 11/13/2018 01:17   Ct Head Wo Contrast  Result Date: 11/13/2018 CLINICAL DATA:  Seizure, new, abnormal neuro exam.  Confusion. EXAM: CT HEAD WITHOUT CONTRAST TECHNIQUE: Contiguous axial images were obtained from the base of the skull through the vertex without intravenous contrast. COMPARISON:  CT head without contrast 03/09/2014 FINDINGS: Brain: No acute infarct, hemorrhage, or mass lesion is present. No significant white matter lesions are present. The ventricles are of normal size. No significant extraaxial fluid collection is present. The brainstem and cerebellum are within normal limits. Vascular: No hyperdense vessel or unexpected calcification. Skull: Postsurgical changes of the high right parietal lobe are again noted. Calvarium is otherwise  intact. No focal lytic or blastic lesions are present. Sinuses/Orbits: Posttraumatic changes and repair of the right orbit are noted. No acute abnormalities are present. The paranasal sinuses and mastoid air cells are clear. IMPRESSION: 1. Normal CT appearance of the brain. No acute abnormalities to explain seizure or altered mental status. 2. 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 Electronically Signed   By: San Morelle M.D.   On: 11/13/2018 06:09    Subjective: Ate his meal well.  No nausea vomiting.  Has chronic abdominal pain.  Alert awake oriented.  Ambulating.  Discharge Exam: Vitals:   11/14/18 1231 11/14/18 1259  BP: 112/75 118/86  Pulse: 79 76  Resp: (!) 22 18  Temp:  97.6 F (36.4 C)  SpO2: 100% 94%   Vitals:   11/14/18 1225 11/14/18 1230 11/14/18 1231 11/14/18 1259  BP:  110/60 112/75 118/86  Pulse: 77 75 79 76  Resp: 13 16 (!) 22 18  Temp:    97.6 F (36.4 C)  TempSrc:    Oral  SpO2: 100% 100% 100% 94%  Weight:      Height:        General: Pt is alert, awake, not in acute distress Cardiovascular: RRR, S1/S2 +, no rubs, no gallops Respiratory: CTA bilaterally, no wheezing, no rhonchi Abdominal: Soft, NT, ND, bowel sounds + Extremities: no edema, no cyanosis   The results of significant diagnostics from this hospitalization (including imaging, microbiology, ancillary and laboratory) are listed below for reference.     Microbiology: Recent Results (from the past 240 hour(s))  SARS Coronavirus 2 (CEPHEID - Performed in [place] hospital lab), Hosp Order     Status: None   Collection Time: 11/13/18  3:02 AM   Specimen: Nasopharyngeal Swab  Result Value Ref Range Status   SARS Coronavirus 2 NEGATIVE NEGATIVE Final    Comment: (NOTE) If result is NEGATIVE SARS-CoV-2 target nucleic acids are NOT DETECTED. The SARS-CoV-2 RNA is generally detectable in upper and lower  respiratory specimens during the acute phase of  infection. The lowest  concentration of SARS-CoV-2 viral copies this assay can detect is 250  copies / mL. A negative result does not preclude SARS-CoV-2 infection  and should not be used as the sole basis for treatment or other  patient management decisions.  A negative result may occur with  improper specimen collection / handling, submission of specimen other  than nasopharyngeal swab, presence of viral mutation(s) within the  areas targeted by this assay, and inadequate number of viral copies  (<250 copies /  mL). A negative result must be combined with clinical  observations, patient history, and epidemiological information. If result is POSITIVE SARS-CoV-2 target nucleic acids are DETECTED. The SARS-CoV-2 RNA is generally detectable in upper and lower  respiratory specimens dur ing the acute phase of infection.  Positive  results are indicative of active infection with SARS-CoV-2.  Clinical  correlation with patient history and other diagnostic information is  necessary to determine patient infection status.  Positive results do  not rule out bacterial infection or co-infection with other viruses. If result is PRESUMPTIVE POSTIVE SARS-CoV-2 nucleic acids MAY BE PRESENT.   A presumptive positive result was obtained on the submitted specimen  and confirmed on repeat testing.  While 2019 novel coronavirus  (SARS-CoV-2) nucleic acids may be present in the submitted sample  additional confirmatory testing may be necessary for epidemiological  and / or clinical management purposes  to differentiate between  SARS-CoV-2 and other Sarbecovirus currently known to infect humans.  If clinically indicated additional testing with an alternate test  methodology [phone]) is advised. The SARS-CoV-2 RNA is generally  detectable in upper and lower respiratory sp ecimens during the acute  phase of infection. The expected result is Negative. Fact Sheet for Patients:   StrictlyIdeas.no Fact Sheet for Healthcare Providers: BankingDealers.co.za This test is not yet approved or cleared by the [place] FDA and has been authorized for detection and/or diagnosis of SARS-CoV-2 by FDA under an Emergency Use Authorization (EUA).  This EUA will remain in effect (meaning this test can be used) for the duration of the COVID-19 declaration under Section 564(b)(1) of the Act, 21 U.S.C. section 360bbb-3(b)(1), unless the authorization is terminated or revoked sooner. Performed at [facility], [place] [address], [place], [place] [address]      Labs: BNP (last 3 results) No results for input(s): BNP in the last 8760 hours. Basic Metabolic Panel: Recent Labs  Lab 11/13/18 0130 11/13/18 1006 11/14/18 0504  NA 132* 134* 138  K 3.8 3.5 3.5  CL 99 103 111  CO2 18* 19* 19*  GLUCOSE 88 75 46*  BUN 16 16 10   CREATININE 2.19* 1.42* 0.75  CALCIUM 8.6* 7.6* 7.6*   Liver Function Tests: Recent Labs  Lab 11/13/18 0130  AST 56*  ALT 40  ALKPHOS 111  BILITOT 1.1  PROT 8.8*  ALBUMIN 4.6   Recent Labs  Lab 11/13/18 0130  LIPASE 67*   No results for input(s): AMMONIA in the last 168 hours. CBC: Recent Labs  Lab 11/13/18 0130 11/13/18 1006 11/14/18 0504  WBC 12.2*  --  6.8  HGB 12.5* 10.9* 9.9*  HCT 39.3 34.6* 31.1*  MCV 99.7  --  103.0*  PLT 166  --  130*   Cardiac Enzymes: No results for input(s): CKTOTAL, CKMB, CKMBINDEX, TROPONINI in the last 168 hours. BNP: Invalid input(s): POCBNP CBG: Recent Labs  Lab 11/14/18 1055 11/14/18 1119 11/14/18 1620  GLUCAP 42* 151* 203*   D-Dimer No results for input(s): DDIMER in the last 72 hours. Hgb A1c Recent Labs    11/13/18 0634  HGBA1C 4.6*   Lipid Profile Recent Labs    11/13/18 0634  CHOL 176  HDL 77  LDLCALC 72  TRIG 134  CHOLHDL 2.3   Thyroid function studies No results for input(s): TSH, T4TOTAL, T3FREE,  THYROIDAB in the last 72 hours.  Invalid input(s): FREET3 Anemia work up No results for input(s): VITAMINB12, FOLATE, FERRITIN, TIBC, IRON, RETICCTPCT in the last 72 hours. Urinalysis  Component Value Date/Time   COLORURINE YELLOW 06/19/2018 [place] 06/19/2018 0747   LABSPEC 1.020 06/19/2018 0747   PHURINE 5.0 06/19/2018 0747   GLUCOSEU NEGATIVE 06/19/2018 0747   HGBUR NEGATIVE 06/19/2018 0747   HGBUR negative 04/30/2010 1020   BILIRUBINUR NEGATIVE 06/19/2018 0747   KETONESUR NEGATIVE 06/19/2018 0747   PROTEINUR NEGATIVE 06/19/2018 0747   UROBILINOGEN 0.2 03/15/2015 0508   NITRITE NEGATIVE 06/19/2018 0747   LEUKOCYTESUR NEGATIVE 06/19/2018 0747   Sepsis Labs Invalid input(s): PROCALCITONIN,  WBC,  LACTICIDVEN Microbiology Recent Results (from the past 240 hour(s))  SARS Coronavirus 2 (CEPHEID - Performed in [place] hospital lab), Hosp Order     Status: None   Collection Time: 11/13/18  3:02 AM   Specimen: Nasopharyngeal Swab  Result Value Ref Range Status   SARS Coronavirus 2 NEGATIVE NEGATIVE Final    Comment: (NOTE) If result is NEGATIVE SARS-CoV-2 target nucleic acids are NOT DETECTED. The SARS-CoV-2 RNA is generally detectable in upper and lower  respiratory specimens during the acute phase of infection. The lowest  concentration of SARS-CoV-2 viral copies this assay can detect is 250  copies / mL. A negative result does not preclude SARS-CoV-2 infection  and should not be used as the sole basis for treatment or other  patient management decisions.  A negative result may occur with  improper specimen collection / handling, submission of specimen other  than nasopharyngeal swab, presence of viral mutation(s) within the  areas targeted by this assay, and inadequate number of viral copies  (<250 copies / mL). A negative result must be combined with clinical  observations, patient history, and epidemiological information. If result is  POSITIVE SARS-CoV-2 target nucleic acids are DETECTED. The SARS-CoV-2 RNA is generally detectable in upper and lower  respiratory specimens dur ing the acute phase of infection.  Positive  results are indicative of active infection with SARS-CoV-2.  Clinical  correlation with patient history and other diagnostic information is  necessary to determine patient infection status.  Positive results do  not rule out bacterial infection or co-infection with other viruses. If result is PRESUMPTIVE POSTIVE SARS-CoV-2 nucleic acids MAY BE PRESENT.   A presumptive positive result was obtained on the submitted specimen  and confirmed on repeat testing.  While 2019 novel coronavirus  (SARS-CoV-2) nucleic acids may be present in the submitted sample  additional confirmatory testing may be necessary for epidemiological  and / or clinical management purposes  to differentiate between  SARS-CoV-2 and other Sarbecovirus currently known to infect humans.  If clinically indicated additional testing with an alternate test  methodology [phone]) is advised. The SARS-CoV-2 RNA is generally  detectable in upper and lower respiratory sp ecimens during the acute  phase of infection. The expected result is Negative. Fact Sheet for Patients:  StrictlyIdeas.no Fact Sheet for Healthcare Providers: BankingDealers.co.za This test is not yet approved or cleared by the [place] FDA and has been authorized for detection and/or diagnosis of SARS-CoV-2 by FDA under an Emergency Use Authorization (EUA).  This EUA will remain in effect (meaning this test can be used) for the duration of the COVID-19 declaration under Section 564(b)(1) of the Act, 21 U.S.C. section 360bbb-3(b)(1), unless the authorization is terminated or revoked sooner. Performed at [facility], [place] [address] [place], [place] [address]      Time coordinating discharge: 25  minutes  SIGNED:   Antonieta Pert, MD  [facility] 11/14/2018, 4:47 PM  If 7PM-7AM, please contact  night-coverage [email or web address]

## 2018-11-14 NOTE — Progress Notes (Signed)
EEG complete - results pending 

## 2018-11-14 NOTE — Progress Notes (Signed)
[name] CBG checked d/t Glucose of 46 on AM labs. CBG 42 at 1055. 25G Dextrose 50% given per protocol. Pt remained alert. Endo RN aware and will recheck CBG in Endo.

## 2018-11-14 NOTE — Progress Notes (Signed)
Initial Nutrition Assessment  RD working remotely.    DOCUMENTATION CODES:   Underweight  INTERVENTION:  - diet advancement as medically feasible. - will order Boost Breeze BID, each supplement provides 250 kcal and 9 grams of protein. - will order 30 mL Prostat BID, each supplement provides 100 kcal and 15 grams of protein. - will order Ensure Enlive BID for when diet advanced to at least FLD, each supplement provides 350 kcal and 20 grams of protein. - will continue to monitor for additional nutrition-related needs.   NUTRITION DIAGNOSIS:   Inadequate oral intake related to inability to eat as evidenced by NPO status.  GOAL:   Patient will meet greater than or equal to 90% of their needs  MONITOR:   Diet advancement, PO intake, Supplement acceptance, Labs, Weight trends, I & O's  REASON FOR ASSESSMENT:   Other (Comment)(Low BMI)  ASSESSMENT:   48 y.o. male with medical history significant of HTN, asthma, WPW, sickle cell trait, anxiety, PTSD, bipolar disorder, migraine headaches, gastric ulcer disease, polysubstance abuse (cocaine, alcohol, tobacco), CKD stage 3, and chronic abdominal pain d/t chronic pancreatitis. He presented to the ED with chest pain, abdominal pain, and seizures. He reported chest pain started 7/6 AM and is located in mid and left side of chest, burning-like pain, 7 out of 10 in severity, radiating to the left jaw and left shoulder, associated with shortness of breath on exertion. He reported abdominal pain started in the evening, constant, moderate, diffused burning-like pain, associated with N/V/D. Stated that he vomited 4 times with little blood in the vomitus.  He reported many episodes of loose stools. Later he wanted to leave AMA, but he had 1 episode of seizure in ED which lasted for about 30 seconds. He does not remember what happened. He was subsequently admitted for observation. Negative CXR and pending head CT results.  Patient is currently in  endoscopy. He has been NPO since admission yesterday. Per chart review, current weight is 120 lb and weight on 3/26 was 125 lb. This indicates 5 lb weight loss (4% body weight) in the past 4 months; not significant for time frame. Patient was last seen by another RD on 01/09/18  At which time he weighed 124 lb. At that time he was identified as meeting criteria for severe malnutrition in the context of chronic illness as evidenced by energy intake of </= 75% for >/= 1 month and 14% body weight loss in 1 month. Suspect patient continues to meet criteria for malnutrition in some capacity based on BMI.   Diet education for chronic pancreatitis was provided to patient by RD on 9/3.  Per notes: - patient with atypical chest pain - abdominal pain with N/V/D, blood in vomit - noted GI bleed with EGD on 6/7 showing non-bleeding gastric ulcer--repeat EGD today (7/8) and FOBT pending - chronic pancreatitis - seizure x1 in ED possibly d/t alcohol withdrawal--reports drinking 6 pack of beer/day and last drink was on 7/5 - AKI    Medications reviewed; 1 mg folvite/day, daily multivitamin with minerals, 12000 units creon TID, 1 g carafate TID, 100 mg oral thiamine/day, 1000 mcg oral cyanocobalamin/day.  Labs reviewed; CBGs: 42 and 151 mg/dl today, Ca: 7.6 mg/dl. IVF; D5-NS @ 125 ml/hr (510 kcal).     NUTRITION - FOCUSED PHYSICAL EXAM:  unable to complete at this time.   Diet Order:   Diet Order            Diet NPO time specified  Diet  effective midnight              EDUCATION NEEDS:   Not appropriate for education at this time  Skin:  Skin Assessment: Reviewed RN Assessment  Last BM:  7/6  Height:   Ht Readings from Last 1 Encounters:  11/14/18 5\' 9"  (1.753 m)    Weight:   Wt Readings from Last 1 Encounters:  11/14/18 54.6 kg    Ideal Body Weight:  72.7 kg  BMI:  Body mass index is 17.78 kg/m.  Estimated Nutritional Needs:   Kcal:  1915-2185 kcal  Protein:  82-100  grams  Fluid:  >/= 2.2 L/day      Jarome Matin, MS, RD, LDN, [facility] Inpatient Clinical Dietitian Pager # [phone] After hours/weekend pager # [phone]

## 2018-11-14 NOTE — Anesthesia Preprocedure Evaluation (Addendum)
Anesthesia Evaluation  Patient identified by MRN, date of birth, ID band Patient awake    Reviewed: Allergy & Precautions, H&P , NPO status , Patient's Chart, lab work & pertinent test results  Airway Mallampati: II  TM Distance: >3 FB Neck ROM: Full    Dental no notable dental hx. (+) Teeth Intact   Pulmonary asthma , Current Smoker,    Pulmonary exam normal breath sounds clear to auscultation       Cardiovascular hypertension, Pt. on medications Normal cardiovascular exam Rhythm:Regular Rate:Normal  Hx of WPW    Neuro/Psych  Headaches, Seizures -,  PSYCHIATRIC DISORDERS    GI/Hepatic GERD  ,(+)     substance abuse  cocaine use,   Endo/Other    Renal/GU      Musculoskeletal   Abdominal   Peds  Hematology  (+) Blood dyscrasia, anemia ,   Anesthesia Other Findings   Reproductive/Obstetrics                            Anesthesia Physical Anesthesia Plan  ASA: IV  Anesthesia Plan: MAC   Post-op Pain Management:    Induction: Intravenous  PONV Risk Score and Plan: 3 and Treatment may vary due to age or medical condition, Dexamethasone and Ondansetron  Airway Management Planned: Natural Airway and Simple Face Mask  Additional Equipment:   Intra-op Plan:   Post-operative Plan:   Informed Consent: I have reviewed the patients History and Physical, chart, labs and discussed the procedure including the risks, benefits and alternatives for the proposed anesthesia with the patient or authorized representative who has indicated his/her understanding and acceptance.       Plan Discussed with:   Anesthesia Plan Comments:        Anesthesia Quick Evaluation

## 2018-11-14 NOTE — Transfer of Care (Signed)
Immediate Anesthesia Transfer of Care Note  Patient: [name]  Procedure(s) Performed: Procedure(s): ESOPHAGOGASTRODUODENOSCOPY (EGD) WITH PROPOFOL (N/A)  Patient Location: PACU  Anesthesia Type:MAC  Level of Consciousness:  sedated, patient cooperative and responds to stimulation  Airway & Oxygen Therapy:Patient Spontanous Breathing and Patient connected to face mask oxgen  Post-op Assessment:  Report given to PACU RN and Post -op Vital signs reviewed and stable  Post vital signs:  Reviewed and stable  Last Vitals:  Vitals:   11/14/18 0624 11/14/18 1114  BP: 119/74 (!) 142/87  Pulse: 88 83  Resp: 18 17  Temp: 36.7 C (!) 36.4 C  SpO2: 106% 269%    Complications: No apparent anesthesia complications

## 2018-11-15 ENCOUNTER — Encounter (HOSPITAL_COMMUNITY): Payer: Self-pay | Admitting: Gastroenterology

## 2018-11-17 ENCOUNTER — Other Ambulatory Visit: Payer: Self-pay

## 2018-11-17 ENCOUNTER — Emergency Department (HOSPITAL_COMMUNITY)
Admission: EM | Admit: 2018-11-17 | Discharge: 2018-11-18 | Disposition: A | Discharge status: Home / Self Care | Payer: Self-pay | Attending: Emergency Medicine | Admitting: Emergency Medicine

## 2018-11-17 ENCOUNTER — Emergency Department (HOSPITAL_COMMUNITY): Payer: Self-pay

## 2018-11-17 ENCOUNTER — Encounter (HOSPITAL_COMMUNITY): Payer: Self-pay | Admitting: *Deleted

## 2018-11-17 DIAGNOSIS — Z91040 Latex allergy status: Secondary | ICD-10-CM | POA: Insufficient documentation

## 2018-11-17 DIAGNOSIS — I10 Essential (primary) hypertension: Secondary | ICD-10-CM | POA: Insufficient documentation

## 2018-11-17 DIAGNOSIS — F17290 Nicotine dependence, other tobacco product, uncomplicated: Secondary | ICD-10-CM | POA: Insufficient documentation

## 2018-11-17 DIAGNOSIS — F1210 Cannabis abuse, uncomplicated: Secondary | ICD-10-CM | POA: Insufficient documentation

## 2018-11-17 DIAGNOSIS — F17210 Nicotine dependence, cigarettes, uncomplicated: Secondary | ICD-10-CM | POA: Insufficient documentation

## 2018-11-17 DIAGNOSIS — K920 Hematemesis: Secondary | ICD-10-CM | POA: Insufficient documentation

## 2018-11-17 DIAGNOSIS — K279 Peptic ulcer, site unspecified, unspecified as acute or chronic, without hemorrhage or perforation: Secondary | ICD-10-CM | POA: Insufficient documentation

## 2018-11-17 DIAGNOSIS — Z79899 Other long term (current) drug therapy: Secondary | ICD-10-CM | POA: Insufficient documentation

## 2018-11-17 DIAGNOSIS — F1410 Cocaine abuse, uncomplicated: Secondary | ICD-10-CM | POA: Insufficient documentation

## 2018-11-17 DIAGNOSIS — K859 Acute pancreatitis without necrosis or infection, unspecified: Secondary | ICD-10-CM | POA: Insufficient documentation

## 2018-11-17 LAB — CBC
HCT: 36.4 % — ABNORMAL LOW (ref 39.0–52.0)
Hemoglobin: 12.2 g/dL — ABNORMAL LOW (ref 13.0–17.0)
MCH: 31.9 pg (ref 26.0–34.0)
MCHC: 33.5 g/dL (ref 30.0–36.0)
MCV: 95.3 fL (ref 80.0–100.0)
Platelets: 225 10*3/uL (ref 150–400)
RBC: 3.82 MIL/uL — ABNORMAL LOW (ref 4.22–5.81)
RDW: 14.4 % (ref 11.5–15.5)
WBC: 7.1 10*3/uL (ref 4.0–10.5)
nRBC: 0 % (ref 0.0–0.2)

## 2018-11-17 LAB — COMPREHENSIVE METABOLIC PANEL
ALT: 57 U/L — ABNORMAL HIGH (ref 0–44)
AST: 81 U/L — ABNORMAL HIGH (ref 15–41)
Albumin: 4 g/dL (ref 3.5–5.0)
Alkaline Phosphatase: 108 U/L (ref 38–126)
Anion gap: 14 (ref 5–15)
BUN: 5 mg/dL — ABNORMAL LOW (ref 6–20)
CO2: 20 mmol/L — ABNORMAL LOW (ref 22–32)
Calcium: 8.8 mg/dL — ABNORMAL LOW (ref 8.9–10.3)
Chloride: 105 mmol/L (ref 98–111)
Creatinine, Ser: 0.75 mg/dL (ref 0.61–1.24)
GFR calc Af Amer: >60 mL/min (ref >60)
GFR calc non Af Amer: >60 mL/min (ref >60)
Glucose, Bld: 86 mg/dL (ref 70–99)
Potassium: 3.3 mmol/L — ABNORMAL LOW (ref 3.5–5.1)
Sodium: 139 mmol/L (ref 135–145)
Total Bilirubin: 0.7 mg/dL (ref 0.3–1.2)
Total Protein: 7.6 g/dL (ref 6.5–8.1)

## 2018-11-17 LAB — TYPE AND SCREEN
ABO/RH(D): B POS
Antibody Screen: NEGATIVE

## 2018-11-17 MED ORDER — ALUM & MAG HYDROXIDE-SIMETH 200-200-20 MG/5ML PO SUSP
30.0000 mL | Freq: Once | ORAL | Status: AC
  Administered 2018-11-18: 30 mL via ORAL
  Filled 2018-11-17: qty 30

## 2018-11-17 MED ORDER — POTASSIUM CHLORIDE CRYS ER 20 MEQ PO TBCR
40.0000 meq | EXTENDED_RELEASE_TABLET | Freq: Once | ORAL | Status: AC
  Administered 2018-11-18: 40 meq via ORAL
  Filled 2018-11-17: qty 2

## 2018-11-17 MED ORDER — OXYCODONE-ACETAMINOPHEN 5-325 MG PO TABS
1.0000 | ORAL_TABLET | Freq: Once | ORAL | Status: AC
  Administered 2018-11-18: 1 via ORAL
  Filled 2018-11-17: qty 1

## 2018-11-17 MED ORDER — LIDOCAINE VISCOUS HCL 2 % MT SOLN
15.0000 mL | Freq: Once | OROMUCOSAL | Status: AC
  Administered 2018-11-18: 15 mL via ORAL
  Filled 2018-11-17: qty 15

## 2018-11-17 NOTE — ED Triage Notes (Signed)
Pt arrived by EMS for hematemesis, abd pain, and chest pain. Was discharged on Thursday for GI bleed after receiving 2 units of blood. EMS gave 4mg  Zofran and 534ml NS for tachycardia. EMS VS 158/116, pulse 138 20g IV in L wrist.

## 2018-11-18 LAB — CBC
HCT: 40.5 % (ref 39.0–52.0)
Hemoglobin: 13.2 g/dL (ref 13.0–17.0)
MCH: 32.7 pg (ref 26.0–34.0)
MCHC: 32.6 g/dL (ref 30.0–36.0)
MCV: 100.2 fL — ABNORMAL HIGH (ref 80.0–100.0)
Platelets: 248 10*3/uL (ref 150–400)
RBC: 4.04 MIL/uL — ABNORMAL LOW (ref 4.22–5.81)
RDW: 14.6 % (ref 11.5–15.5)
WBC: 8.6 10*3/uL (ref 4.0–10.5)
nRBC: 0 % (ref 0.0–0.2)

## 2018-11-18 LAB — LIPASE, BLOOD: Lipase: 36 U/L (ref 11–51)

## 2018-11-18 MED ORDER — SUCRALFATE 1 G PO TABS: 1.0000 g | ORAL_TABLET | Freq: Three times a day (TID) | ORAL | 0 refills | Status: DC

## 2018-11-18 NOTE — ED Provider Notes (Addendum)
[facility] EMERGENCY DEPARTMENT Provider Note   CSN: [id number] Arrival date & time: 11/17/18  2025    History   Chief Complaint Chief Complaint  Patient presents with   Hematemesis    HPI [name] is a 48 y.o. male.     HPI 48 year old male with history of alcoholism, cocaine use, GERD, peptic ulcer disease, chronic pancreatitis comes in a chief complaint of chest pain, bloody vomitus.  Patient reports that between 28 and 9, he had about 2-3 episodes of bloody vomitus.  He was recently admitted to the hospital for bloody vomitus and was given 2 units of blood.  He states that he has cut down on his alcohol.  Patient's chest pain is similar to his GI pain.  Pain is nonradiating, and he denies any associated shortness of breath, diaphoresis.  No emesis since he arrived to the ER.  Past Medical History:  Diagnosis Date   Alcoholism /alcohol abuse ([place])    Anemia    Anxiety    Arthritis    "knees; arms; elbows" (03/26/2015)   Asthma    Bipolar disorder (HCC)    Chronic bronchitis (HCC)    Chronic lower back pain    Chronic pancreatitis ([place])    Cocaine abuse ([place])    Depression    Family history of adverse reaction to anesthesia    "grandmother gets confused"   Femoral condyle fracture ([place]) 03/08/2014   left medial/notes 03/09/2014   GERD (gastroesophageal reflux disease)    H/O hiatal hernia    H/O suicide attempt 10/2012   High cholesterol    History of blood transfusion 10/2012   "when I tried to commit suicide"   History of stomach ulcers    Hypertension    Marijuana abuse, continuous    Migraine    "a few times/year" (03/26/2015)   Pneumonia 1990's X 3   PTSD (post-traumatic stress disorder)    Sickle cell trait ([place])    WPW (Wolff-Parkinson-White syndrome)    [name] 03/06/2013    Patient Active Problem List   Diagnosis Date Noted   AKI (acute kidney injury) ([place]) 11/13/2018   Seizure ([place])  11/13/2018   Gastritis and gastroduodenitis    Chest pain 01/08/2018   GI bleed 11/24/2017   Acute blood loss anemia 11/24/2017   Atypical chest pain 11/24/2017   Acute pancreatitis 09/28/2017   Abdominal pain 05/27/2017   Hematemesis 05/27/2017   Tachycardia 03/18/2017   Diarrhea 03/18/2017   Acute on chronic pancreatitis ([place]) 12/17/2016   Intractable nausea and vomiting 12/05/2016   Verbally abusive behavior 12/05/2016   Normocytic anemia 12/05/2016   Alcohol use disorder, severe, dependence ([place]) 07/25/2016   Cocaine use disorder, severe, dependence ([place]) 07/25/2016   Major depressive disorder, 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/20/2016   Leukocytosis    Hospital acquired PNA 05/20/2015   Chronic pancreatitis ([place]) 05/18/2015   Pseudocyst of pancreas 05/18/2015   Polysubstance abuse (tobacco, cocaine, THC, and ETOH) 03/26/2015   Alcohol-induced chronic pancreatitis ([place])    Benign essential HTN 02/06/2014   Alcohol-induced acute pancreatitis 11/28/2013   Pancreatic pseudocyst/cyst 11/25/2013   Severe protein-calorie malnutrition ([place]) 10/10/2013   Suicide attempt ([place]) 10/08/2013   [name] Parkinson White pattern seen on electrocardiogram 10/03/2012   TOBACCO ABUSE 03/23/2007    Past Surgical History:  Procedure Laterality Date   BIOPSY  11/25/2017   Procedure: BIOPSY;  Surgeon: Arta Silence, MD;  Location: [place];  Service: Endoscopy;;   BIOPSY  10/14/2018   Procedure: BIOPSY;  Surgeon: Arta Silence, MD;  Location: [place];  Service: Endoscopy;;   CARDIAC CATHETERIZATION     ESOPHAGOGASTRODUODENOSCOPY (EGD) WITH PROPOFOL N/A 11/25/2017   Procedure: ESOPHAGOGASTRODUODENOSCOPY (EGD) WITH PROPOFOL;  Surgeon: Arta Silence, MD;  Location[facility] ENDOSCOPY;  Service: Endoscopy;  Laterality: N/A;   ESOPHAGOGASTRODUODENOSCOPY (EGD) WITH PROPOFOL Left 10/14/2018   Procedure: ESOPHAGOGASTRODUODENOSCOPY (EGD) WITH  PROPOFOL;  Surgeon: Arta Silence, MD;  Location: [facility] ENDOSCOPY;  Service: Endoscopy;  Laterality: Left;   ESOPHAGOGASTRODUODENOSCOPY (EGD) WITH PROPOFOL N/A 11/14/2018   Procedure: ESOPHAGOGASTRODUODENOSCOPY (EGD) WITH PROPOFOL;  Surgeon: Laurence Spates, MD;  Location: [facility] ENDOSCOPY;  Service: Gastroenterology;  Laterality: N/A;   EYE SURGERY Left 1990's   "result of trauma"    FACIAL FRACTURE SURGERY Left 1990's   "result of trauma"    FRACTURE SURGERY     HERNIA REPAIR     LEFT HEART CATHETERIZATION WITH CORONARY ANGIOGRAM Right 03/07/2013   Procedure: LEFT HEART CATHETERIZATION WITH CORONARY ANGIOGRAM;  Surgeon: [name], MD;  Location: [place] CATH LAB;  Service: Cardiovascular;  Laterality: Right;   UMBILICAL HERNIA REPAIR          Home Medications    Prior to Admission medications   Medication Sig Start Date End Date Taking? Authorizing Provider  amitriptyline (ELAVIL) 25 MG tablet Take 1 tablet (25 mg total) by mouth at bedtime. 10/15/18  Yes [name], MD  Cyanocobalamin (VITAMIN B-12 PO) Take 1 tablet by mouth daily.   Yes [provider]  folic acid (FOLVITE) 1 MG tablet Take 1 tablet (1 mg total) by mouth daily. 11/26/17  Yes [name], MD  hydrALAZINE (APRESOLINE) 25 MG tablet Take 25 mg by mouth daily.   Yes [provider]  lipase/protease/amylase (CREON) 12000 units CPEP capsule Take 1 capsule (12,000 Units total) by mouth 3 (three) times daily with meals. For pancreatitis 10/15/18  Yes [name], MD  loratadine (CLARITIN) 10 MG tablet Take 1 tablet (10 mg total) by mouth daily as needed for allergies or rhinitis. (May purchase from over the counter): For allergies Patient taking differently: Take 10 mg by mouth daily. (May purchase from over the counter): For allergies 10/01/17  Yes Aline August, MD  LORazepam (ATIVAN) 0.5 MG tablet Take 0.5 mg by mouth every 8 (eight) hours as needed for anxiety.   Yes [provider]    metoprolol tartrate (LOPRESSOR) 100 MG tablet Take 100 mg by mouth daily.   Yes [provider]  Multiple Vitamin (MULTIVITAMIN WITH MINERALS) TABS tablet Take 1 tablet by mouth daily. 09/06/17  Yes Dana Allan I, MD  nicotine (NICODERM CQ - DOSED IN MG/24 HOURS) 21 mg/24hr patch Place 1 patch (21 mg total) onto the skin daily. 10/16/18  Yes [name], MD  nicotine polacrilex (NICORETTE) 4 MG gum Take 4 mg by mouth daily as needed for smoking cessation.   Yes [provider]  ondansetron (ZOFRAN) 4 MG tablet Take 4 mg by mouth every 8 (eight) hours as needed for nausea or vomiting.   Yes [provider]  oxyCODONE-acetaminophen (PERCOCET/ROXICET) 5-325 MG tablet Take 1 tablet by mouth every 8 (eight) hours as needed for severe pain.   Yes [provider]  pantoprazole (PROTONIX) 40 MG tablet Take 1 tablet (40 mg total) by mouth 2 (two) times daily before a meal. 10/15/18  Yes [name], MD  QUEtiapine (SEROQUEL) 100 MG tablet Take 100 mg by mouth at bedtime.  Yes [provider]  sertraline (ZOLOFT) 100 MG tablet Take 1 tablet (100 mg total) by mouth daily. Patient taking differently: Take 150 mg by mouth daily.  08/29/17  Yes Clent Demark, PA-C  thiamine 100 MG tablet Take 1 tablet (100 mg total) by mouth daily. 10/16/18  Yes [name], MD  gabapentin (NEURONTIN) 100 MG capsule Take 1 capsule (100 mg total) by mouth 2 (two) times daily. Patient not taking: Reported on 11/13/2018 10/15/18   [name], MD  sucralfate (CARAFATE) 1 g tablet Take 1 tablet (1 g total) by mouth 4 (four) times daily -  with meals and at bedtime. 11/18/18   Varney Biles, MD    Family History Family History  Problem Relation Age of Onset   Hypertension Mother    Cirrhosis Mother    Alcoholism Mother    Hypertension Father    Melanoma Father    Hypertension Other    Coronary artery disease Other     Social History Social History    Tobacco Use   Smoking status: Current Every Day Smoker    Packs/day: 1.00    Years: 33.00    Pack years: 33.00    Types: Cigarettes, E-cigarettes   Smokeless tobacco: Never Used  Substance Use Topics   Alcohol use: Yes   Drug use: Yes    Types: Marijuana, Cocaine    Comment: daily marijuana use; last cocaine use about 3 months ago     Allergies   Robaxin [methocarbamol], Shellfish-derived products, Trazodone, Trazodone and nefazodone, Adhesive [tape], Latex, Toradol [ketorolac tromethamine], Contrast media [iodinated diagnostic agents], and Reglan [metoclopramide]   Review of Systems Review of Systems  Constitutional: Positive for activity change.  Respiratory: Negative for shortness of breath.   Cardiovascular: Positive for chest pain.  Gastrointestinal: Positive for abdominal pain, nausea and vomiting. Negative for blood in stool.  Hematological: Does not bruise/bleed easily.  All other systems reviewed and are negative.    Physical Exam Updated Vital Signs BP (!) 145/111 (BP Location: Right Arm)    Pulse (!) 109    Temp 97.7 F (36.5 C) (Oral)    Resp 16    SpO2 95%   Physical Exam Vitals signs and nursing note reviewed.  Constitutional:      Appearance: He is well-developed.  HENT:     Head: Atraumatic.  Eyes:     Extraocular Movements: Extraocular movements intact.     Pupils: Pupils are equal, round, and reactive to light.  Neck:     Musculoskeletal: Neck supple.  Cardiovascular:     Rate and Rhythm: Normal rate.  Pulmonary:     Effort: Pulmonary effort is normal.  Abdominal:     Tenderness: There is abdominal tenderness. There is no guarding.  Skin:    General: Skin is warm.  Neurological:     Mental Status: He is alert and oriented to person, place, and time.     ED Treatments / Results  Labs (all labs ordered are listed, but only abnormal results are displayed) Labs Reviewed  COMPREHENSIVE METABOLIC PANEL - Abnormal; Notable for the  following components:      Result Value   Potassium 3.3 (*)    CO2 20 (*)    BUN 5 (*)    Calcium 8.8 (*)    AST 81 (*)    ALT 57 (*)    All other components within normal limits  CBC - Abnormal; Notable for the following components:   RBC  3.82 (*)    Hemoglobin 12.2 (*)    HCT 36.4 (*)    All other components within normal limits  CBC - Abnormal; Notable for the following components:   RBC 4.04 (*)    MCV 100.2 (*)    All other components within normal limits  LIPASE, BLOOD  TYPE AND SCREEN    EKG EKG Interpretation  Date/Time:  Saturday November 17 2018 23:04:01 EDT Ventricular Rate:  106 PR Interval:    QRS Duration: 74 QT Interval:  281 QTC Calculation: 373 R Axis:   84 Text Interpretation:  Sinus tachycardia Biatrial enlargement Repol abnrm suggests ischemia, diffuse leads inferior and anterior/lateral leads TWI in new ST depression diffusely Confirmed by Varney Biles ([id number]) on 11/17/2018 11:12:23 PM   Radiology Dg Chest Port 1 View  Result Date: 11/17/2018 CLINICAL DATA:  Chest pain and hematemesis EXAM: PORTABLE CHEST 1 VIEW COMPARISON:  11/13/2018 FINDINGS: Cardiac shadow is within normal limits. The lungs are well aerated bilaterally. No focal infiltrate or sizable effusion is seen. No acute bony abnormality is noted. IMPRESSION: No active disease. Electronically Signed   By: Inez Catalina M.D.   On: 11/17/2018 23:54    Procedures Procedures (including critical care time)  Medications Ordered in ED Medications  potassium chloride SA (K-DUR) CR tablet 40 mEq (has no administration in time range)  alum & mag hydroxide-simeth (MAALOX/MYLANTA) 200-200-20 MG/5ML suspension 30 mL (has no administration in time range)    And  lidocaine (XYLOCAINE) 2 % viscous mouth solution 15 mL (has no administration in time range)  oxyCODONE-acetaminophen (PERCOCET/ROXICET) 5-325 MG per tablet 1 tablet (has no administration in time range)     Initial Impression / Assessment  and Plan / ED Course  I have reviewed the triage vital signs and the nursing notes.  Pertinent labs & imaging results that were available during my care of the patient were reviewed by me and considered in my medical decision making (see chart for details).  Clinical Course as of Nov 18 42  Sun Nov 18, 2018  0043 Repeat hemoglobin is 13.2.  Lipase is normal.  Heart rate is improved from 1 40-109.  No signs of profound dehydration.  We will discharge with appropriate meds.   [AN]    Clinical Course User Index [AN] Varney Biles, MD       48 year old comes in a chief complaint of bloody vomitus and chest discomfort.  He has known history of pancreatitis, gastritis, alcoholism, cocaine use and was recently admitted to the hospital for bloody vomitus, and he required 2 units of PRBCs.  His endoscopy was reviewed.  The endoscopy revealed gastritis with no active bleeding and there was no varices.  Differential diagnosis includes Mallory-Weiss tear related bleeding, gastritis/ulcer related bleeding.  Pancreatitis could be the other etiology for his pain.  Serial CBC ordered.  Patient has not had any emesis here.  His initial hemoglobin is already better than it was prior to discharge.  If the hemoglobin stays stable then we would feel comfortable sending him home.  BUN is also less than 5, which is reassuring.  Clinically there is no suspicion for peritonitis or necrosis from the pancreas.  CT scan from end of June reviewed.  Final Clinical Impressions(s) / ED Diagnoses   Final diagnoses:  Acute pancreatitis without infection or necrosis, unspecified pancreatitis type  PUD (peptic ulcer disease)    ED Discharge Orders         Ordered    sucralfate ([place])  1 g tablet  3 times daily with meals & bedtime     11/18/18 0042           Varney Biles, MD 11/18/18 4975    Varney Biles, MD 11/18/18 [phone]

## 2018-11-18 NOTE — Discharge Instructions (Signed)
You are seen in the ER for abdominal pain and vomiting. Her work-up in the ER that included labs, x-rays did not show any abnormalities.  Please follow the instructions provided.  Take the medications prescribed.  Refrain from alcohol and cocaine use.

## 2018-11-24 ENCOUNTER — Other Ambulatory Visit: Payer: Self-pay

## 2018-11-24 ENCOUNTER — Emergency Department (HOSPITAL_COMMUNITY): Payer: Self-pay

## 2018-11-24 ENCOUNTER — Emergency Department (HOSPITAL_COMMUNITY)
Admission: EM | Admit: 2018-11-24 | Discharge: 2018-11-24 | Disposition: A | Discharge status: Home / Self Care | Payer: Self-pay | Attending: Emergency Medicine | Admitting: Emergency Medicine

## 2018-11-24 ENCOUNTER — Encounter (HOSPITAL_COMMUNITY): Payer: Self-pay | Admitting: *Deleted

## 2018-11-24 DIAGNOSIS — I10 Essential (primary) hypertension: Secondary | ICD-10-CM | POA: Insufficient documentation

## 2018-11-24 DIAGNOSIS — F17210 Nicotine dependence, cigarettes, uncomplicated: Secondary | ICD-10-CM | POA: Insufficient documentation

## 2018-11-24 DIAGNOSIS — Z79899 Other long term (current) drug therapy: Secondary | ICD-10-CM | POA: Insufficient documentation

## 2018-11-24 DIAGNOSIS — K2970 Gastritis, unspecified, without bleeding: Secondary | ICD-10-CM | POA: Insufficient documentation

## 2018-11-24 DIAGNOSIS — J45909 Unspecified asthma, uncomplicated: Secondary | ICD-10-CM | POA: Insufficient documentation

## 2018-11-24 DIAGNOSIS — K529 Noninfective gastroenteritis and colitis, unspecified: Secondary | ICD-10-CM | POA: Insufficient documentation

## 2018-11-24 DIAGNOSIS — Z91040 Latex allergy status: Secondary | ICD-10-CM | POA: Insufficient documentation

## 2018-11-24 LAB — URINALYSIS, ROUTINE W REFLEX MICROSCOPIC
Bacteria, UA: NONE SEEN
Bilirubin Urine: NEGATIVE
Glucose, UA: NEGATIVE mg/dL
Hgb urine dipstick: NEGATIVE
Ketones, ur: NEGATIVE mg/dL
Leukocytes,Ua: NEGATIVE
Nitrite: NEGATIVE
Protein, ur: 30 mg/dL — AB
Specific Gravity, Urine: 1.028 (ref 1.005–1.030)
pH: 5 (ref 5.0–8.0)

## 2018-11-24 LAB — BASIC METABOLIC PANEL
Anion gap: 14 (ref 5–15)
BUN: 9 mg/dL (ref 6–20)
CO2: 20 mmol/L — ABNORMAL LOW (ref 22–32)
Calcium: 8.4 mg/dL — ABNORMAL LOW (ref 8.9–10.3)
Chloride: 102 mmol/L (ref 98–111)
Creatinine, Ser: 0.92 mg/dL (ref 0.61–1.24)
GFR calc Af Amer: >60 mL/min (ref >60)
GFR calc non Af Amer: >60 mL/min (ref >60)
Glucose, Bld: 125 mg/dL — ABNORMAL HIGH (ref 70–99)
Potassium: 4 mmol/L (ref 3.5–5.1)
Sodium: 136 mmol/L (ref 135–145)

## 2018-11-24 LAB — HEPATIC FUNCTION PANEL
ALT: 55 U/L — ABNORMAL HIGH (ref 0–44)
AST: 58 U/L — ABNORMAL HIGH (ref 15–41)
Albumin: 4.1 g/dL (ref 3.5–5.0)
Alkaline Phosphatase: 122 U/L (ref 38–126)
Bilirubin, Direct: 0.2 mg/dL (ref 0.0–0.2)
Indirect Bilirubin: 0.6 mg/dL (ref 0.3–0.9)
Total Bilirubin: 0.8 mg/dL (ref 0.3–1.2)
Total Protein: 8 g/dL (ref 6.5–8.1)

## 2018-11-24 LAB — TROPONIN I (HIGH SENSITIVITY)
Troponin I (High Sensitivity): 5 ng/L (ref <18)
Troponin I (High Sensitivity): 6 ng/L (ref <18)

## 2018-11-24 LAB — CBC
HCT: 40.8 % (ref 39.0–52.0)
Hemoglobin: 14.1 g/dL (ref 13.0–17.0)
MCH: 32.3 pg (ref 26.0–34.0)
MCHC: 34.6 g/dL (ref 30.0–36.0)
MCV: 93.6 fL (ref 80.0–100.0)
Platelets: 284 10*3/uL (ref 150–400)
RBC: 4.36 MIL/uL (ref 4.22–5.81)
RDW: 13.9 % (ref 11.5–15.5)
WBC: 8.4 10*3/uL (ref 4.0–10.5)
nRBC: 0 % (ref 0.0–0.2)

## 2018-11-24 LAB — LIPASE, BLOOD: Lipase: 39 U/L (ref 11–51)

## 2018-11-24 MED ORDER — PANTOPRAZOLE SODIUM 20 MG PO TBEC
20.0000 mg | DELAYED_RELEASE_TABLET | Freq: Every day | ORAL | Status: DC
  Filled 2018-11-24: qty 1

## 2018-11-24 MED ORDER — AZITHROMYCIN 500 MG PO TABS: 500.0000 mg | ORAL_TABLET | Freq: Every day | ORAL | 0 refills | Status: AC

## 2018-11-24 MED ORDER — ONDANSETRON HCL 4 MG/2ML IJ SOLN
4.0000 mg | Freq: Once | INTRAMUSCULAR | Status: AC
  Administered 2018-11-24: 4 mg via INTRAVENOUS
  Filled 2018-11-24: qty 2

## 2018-11-24 MED ORDER — SODIUM CHLORIDE 0.9 % IV BOLUS
1000.0000 mL | Freq: Once | INTRAVENOUS | Status: AC
  Administered 2018-11-24: 1000 mL via INTRAVENOUS

## 2018-11-24 MED ORDER — ONDANSETRON HCL 4 MG PO TABS: 4.0000 mg | ORAL_TABLET | Freq: Three times a day (TID) | ORAL | 0 refills | Status: DC | PRN

## 2018-11-24 MED ORDER — ALUM & MAG HYDROXIDE-SIMETH 200-200-20 MG/5ML PO SUSP
30.0000 mL | Freq: Once | ORAL | Status: AC
  Administered 2018-11-24: 30 mL via ORAL
  Filled 2018-11-24: qty 30

## 2018-11-24 MED ORDER — LIDOCAINE VISCOUS HCL 2 % MT SOLN
15.0000 mL | Freq: Once | OROMUCOSAL | Status: AC
  Administered 2018-11-24: 10:00:00 15 mL via ORAL
  Filled 2018-11-24: qty 15

## 2018-11-24 MED ORDER — PANTOPRAZOLE SODIUM 20 MG PO TBEC: 20.0000 mg | DELAYED_RELEASE_TABLET | Freq: Every day | ORAL | 0 refills | Status: DC

## 2018-11-24 MED ORDER — MORPHINE SULFATE (PF) 4 MG/ML IV SOLN
4.0000 mg | Freq: Once | INTRAVENOUS | Status: AC
  Administered 2018-11-24: 4 mg via INTRAVENOUS
  Filled 2018-11-24: qty 1

## 2018-11-24 MED ORDER — PANTOPRAZOLE SODIUM 40 MG IV SOLR
40.0000 mg | Freq: Once | INTRAVENOUS | Status: AC
  Administered 2018-11-24: 40 mg via INTRAVENOUS
  Filled 2018-11-24: qty 40

## 2018-11-24 MED ORDER — SUCRALFATE 1 G PO TABS: 1.0000 g | ORAL_TABLET | Freq: Three times a day (TID) | ORAL | 0 refills | Status: DC

## 2018-11-24 MED ORDER — SUCRALFATE 1 G PO TABS
1.0000 g | ORAL_TABLET | Freq: Once | ORAL | Status: AC
  Administered 2018-11-24: 1 g via ORAL
  Filled 2018-11-24: qty 1

## 2018-11-24 NOTE — ED Provider Notes (Signed)
[facility] EMERGENCY DEPARTMENT Provider Note   CSN: [id number] Arrival date & time: 11/24/18  0102    History   Chief Complaint Chief Complaint  Patient presents with   GI Problem    HPI [name] is a 48 y.o. male with history of alcohol abuse, chronic pancreatitis, WPW, hypertension, GERD, PUD, substance abuse presents today for upper abdominal pain, nausea, hematemesis and diarrhea.  Patient reports that at 4 AM he began to have diarrhea that appeared bloody x1, followed by upper abdominal pain/lower chest that began at 8 AM that he describes as a severe constant sharp pain worsened with palpation and emesis and without alleviating factor without radiation.  With onset of his abdominal pain he also developed bloody vomiting he describes streaking of blood in his emesis, denies bilious emesis.  He reports similar pain in the past and describes it as feeling like a more severe episode of both his gastric ulcer disease and pancreatitis.  He denies alcohol use today but reports using alcohol yesterday.  He denies fever/chills, shortness of breath, dysuria/hematuria, cough/hemoptysis or any additional concerns today.    HPI  Past Medical History:  Diagnosis Date   Alcoholism /alcohol abuse ([place])    Anemia    Anxiety    Arthritis    "knees; arms; elbows" (03/26/2015)   Asthma    Bipolar disorder (HCC)    Chronic bronchitis (HCC)    Chronic lower back pain    Chronic pancreatitis ([place])    Cocaine abuse ([place])    Depression    Family history of adverse reaction to anesthesia    "grandmother gets confused"   Femoral condyle fracture ([place]) 03/08/2014   left medial/notes 03/09/2014   GERD (gastroesophageal reflux disease)    H/O hiatal hernia    H/O suicide attempt 10/2012   High cholesterol    History of blood transfusion 10/2012   "when I tried to commit suicide"   History of stomach ulcers    Hypertension    Marijuana  abuse, continuous    Migraine    "a few times/year" (03/26/2015)   Pneumonia 1990's X 3   PTSD (post-traumatic stress disorder)    Sickle cell trait ([place])    WPW (Wolff-Parkinson-White syndrome)    [name] 03/06/2013    Patient Active Problem List   Diagnosis Date Noted   AKI (acute kidney injury) ([place]) 11/13/2018   Seizure ([place]) 11/13/2018   Gastritis and gastroduodenitis    Chest pain 01/08/2018   GI bleed 11/24/2017   Acute blood loss anemia 11/24/2017   Atypical chest pain 11/24/2017   Acute pancreatitis 09/28/2017   Abdominal pain 05/27/2017   Hematemesis 05/27/2017   Tachycardia 03/18/2017   Diarrhea 03/18/2017   Acute on chronic pancreatitis ([place]) 12/17/2016   Intractable nausea and vomiting 12/05/2016   Verbally abusive behavior 12/05/2016   Normocytic anemia 12/05/2016   Alcohol use disorder, severe, dependence ([place]) 07/25/2016   Cocaine use disorder, severe, dependence ([place]) 07/25/2016   Major depressive disorder, 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   Leukocytosis    Hospital acquired PNA 05/20/2015   Chronic pancreatitis ([place]) 05/18/2015   Pseudocyst of pancreas 05/18/2015   Polysubstance abuse (tobacco, cocaine, THC, and ETOH) 03/26/2015   Alcohol-induced chronic pancreatitis ([place])    Benign essential HTN 02/06/2014   Alcohol-induced acute pancreatitis 11/28/2013   Pancreatic pseudocyst/cyst 11/25/2013   Severe protein-calorie malnutrition ([place]) 10/10/2013   Suicide attempt ([place]) 10/08/2013   [name] Parkinson White pattern seen  on electrocardiogram 10/03/2012   TOBACCO ABUSE 03/23/2007    Past Surgical History:  Procedure Laterality Date   BIOPSY  11/25/2017   Procedure: BIOPSY;  Surgeon: Arta Silence, MD;  Location: [facility] ENDOSCOPY;  Service: Endoscopy;;   BIOPSY  10/14/2018   Procedure: BIOPSY;  Surgeon: Arta Silence, MD;  Location: [facility];  Service: Endoscopy;;   CARDIAC  CATHETERIZATION     ESOPHAGOGASTRODUODENOSCOPY (EGD) WITH PROPOFOL N/A 11/25/2017   Procedure: ESOPHAGOGASTRODUODENOSCOPY (EGD) WITH PROPOFOL;  Surgeon: Arta Silence, MD;  Location: [facility] ENDOSCOPY;  Service: Endoscopy;  Laterality: N/A;   ESOPHAGOGASTRODUODENOSCOPY (EGD) WITH PROPOFOL Left 10/14/2018   Procedure: ESOPHAGOGASTRODUODENOSCOPY (EGD) WITH PROPOFOL;  Surgeon: Arta Silence, MD;  Location[facility] ENDOSCOPY;  Service: Endoscopy;  Laterality: Left;   ESOPHAGOGASTRODUODENOSCOPY (EGD) WITH PROPOFOL N/A 11/14/2018   Procedure: ESOPHAGOGASTRODUODENOSCOPY (EGD) WITH PROPOFOL;  Surgeon: Laurence Spates, MD;  Location: [facility] ENDOSCOPY;  Service: Gastroenterology;  Laterality: N/A;   EYE SURGERY Left 1990's   "result of trauma"    FACIAL FRACTURE SURGERY Left 1990's   "result of trauma"    FRACTURE SURGERY     HERNIA REPAIR     LEFT HEART CATHETERIZATION WITH CORONARY ANGIOGRAM Right 03/07/2013   Procedure: LEFT HEART CATHETERIZATION WITH CORONARY ANGIOGRAM;  Surgeon: [name], MD;  Location: [facility] CATH LAB;  Service: Cardiovascular;  Laterality: Right;   UMBILICAL HERNIA REPAIR          Home Medications    Prior to Admission medications   Medication Sig Start Date End Date Taking? Authorizing Provider  acetaminophen (TYLENOL) 500 MG tablet Take 1,000 mg by mouth every 6 (six) hours as needed for mild pain.   Yes [provider]  amitriptyline (ELAVIL) 25 MG tablet Take 1 tablet (25 mg total) by mouth at bedtime. 10/15/18  Yes [name], MD  Cyanocobalamin (VITAMIN B-12 PO) Take 1 tablet by mouth daily.   Yes [provider]  folic acid (FOLVITE) 1 MG tablet Take 1 tablet (1 mg total) by mouth daily. 11/26/17  Yes [name], MD  gabapentin (NEURONTIN) 100 MG capsule Take 1 capsule (100 mg total) by mouth 2 (two) times daily. 10/15/18  Yes [name], MD  hydrALAZINE (APRESOLINE) 25 MG tablet Take 25 mg by mouth daily.   Yes [provider]    lipase/protease/amylase (CREON) 12000 units CPEP capsule Take 1 capsule (12,000 Units total) by mouth 3 (three) times daily with meals. For pancreatitis 10/15/18  Yes [name], MD  loratadine (CLARITIN) 10 MG tablet Take 1 tablet (10 mg total) by mouth daily as needed for allergies or rhinitis. (May purchase from over the counter): For allergies Patient taking differently: Take 10 mg by mouth daily. (May purchase from over the counter): For allergies 10/01/17  Yes Aline August, MD  LORazepam (ATIVAN) 0.5 MG tablet Take 0.5 mg by mouth every 8 (eight) hours as needed for anxiety.   Yes [provider]  metoprolol tartrate (LOPRESSOR) 100 MG tablet Take 100 mg by mouth 2 (two) times daily.    Yes [provider]  Multiple Vitamin (MULTIVITAMIN WITH MINERALS) TABS tablet Take 1 tablet by mouth daily. 09/06/17  Yes Dana Allan I, MD  nicotine (NICODERM CQ - DOSED IN MG/24 HOURS) 21 mg/24hr patch Place 1 patch (21 mg total) onto the skin daily. 10/16/18  Yes [name], MD  nicotine polacrilex (NICORETTE) 4 MG gum Take 4 mg by mouth daily as needed for smoking cessation.   Yes [provider]  pantoprazole (PROTONIX) 40 MG tablet Take 1 tablet (40 mg total) by mouth 2 (two) times daily before a meal. 10/15/18  Yes [name], MD  QUEtiapine (SEROQUEL) 100 MG tablet Take 100 mg by mouth at bedtime.   Yes [provider]  sertraline (ZOLOFT) 100 MG tablet Take 1 tablet (100 mg total) by mouth daily. Patient taking differently: Take 150 mg by mouth daily.  08/29/17  Yes Clent Demark, PA-C  thiamine 100 MG tablet Take 1 tablet (100 mg total) by mouth daily. 10/16/18  Yes [name], MD  azithromycin (ZITHROMAX) 500 MG tablet Take 1 tablet (500 mg total) by mouth daily for 5 days. 11/24/18 11/29/18  Nuala Alpha A, PA-C  ondansetron (ZOFRAN) 4 MG tablet Take 1 tablet (4 mg total) by mouth every 8 (eight) hours as needed for nausea or vomiting.  11/24/18   Deliah Boston, PA-C  oxyCODONE-acetaminophen (PERCOCET/ROXICET) 5-325 MG tablet Take 1 tablet by mouth every 8 (eight) hours as needed for severe pain.    [provider]  sucralfate (CARAFATE) 1 g tablet Take 1 tablet (1 g total) by mouth 4 (four) times daily -  with meals and at bedtime. 11/24/18   Deliah Boston, PA-C    Family History Family History  Problem Relation Age of Onset   Hypertension Mother    Cirrhosis Mother    Alcoholism Mother    Hypertension Father    Melanoma Father    Hypertension Other    Coronary artery disease Other     Social History Social History   Tobacco Use   Smoking status: Current Every Day Smoker    Packs/day: 1.00    Years: 33.00    Pack years: 33.00    Types: Cigarettes, E-cigarettes   Smokeless tobacco: Never Used  Substance Use Topics   Alcohol use: Yes   Drug use: Yes    Types: Marijuana, Cocaine    Comment: daily marijuana use; last cocaine use about 3 months ago     Allergies   Robaxin [methocarbamol], Shellfish-derived products, Trazodone, Trazodone and nefazodone, Adhesive [tape], Latex, Toradol [ketorolac tromethamine], Contrast media [iodinated diagnostic agents], and Reglan [metoclopramide]   Review of Systems Review of Systems Ten systems are reviewed and are negative for acute change except as noted in the HPI  Physical Exam Updated Vital Signs BP (!) 126/115    Pulse (!) 112    Temp 97.6 F (36.4 C) (Oral)    Resp 16    Ht 5\' 9"  (1.753 m)    Wt 56.7 kg    SpO2 100%    BMI 18.46 kg/m   Physical Exam Constitutional:      General: He is not in acute distress.    Appearance: Normal appearance. He is well-developed. He is not ill-appearing or diaphoretic.  HENT:     Head: Normocephalic and atraumatic.     Right Ear: External ear normal.     Left Ear: External ear normal.     Nose: Nose normal.  Eyes:     General: Vision grossly intact. Gaze aligned appropriately.     Pupils:  Pupils are equal, round, and reactive to light.  Neck:     Musculoskeletal: Normal range of motion.     Trachea: Trachea and phonation normal. No tracheal deviation.  Cardiovascular:     Rate and Rhythm: Regular rhythm. Tachycardia present.     Pulses: Normal pulses.  Dorsalis pedis pulses are 2+ on the right side and 2+ on the left side.     Heart sounds: Normal heart sounds.  Pulmonary:     Effort: Pulmonary effort is normal. No respiratory distress.     Breath sounds: Normal breath sounds.  Abdominal:     General: There is no distension.     Palpations: Abdomen is soft.     Tenderness: There is abdominal tenderness in the epigastric area and left upper quadrant. There is guarding. There is no rebound. Negative signs include Murphy's sign and McBurney's sign.  Musculoskeletal: Normal range of motion.     Right lower leg: Normal. He exhibits no tenderness. No edema.     Left lower leg: Normal. He exhibits no tenderness. No edema.  Skin:    General: Skin is warm and dry.  Neurological:     Mental Status: He is alert.     GCS: GCS eye subscore is 4. GCS verbal subscore is 5. GCS motor subscore is 6.     Comments: Speech is clear and goal oriented, follows commands Major Cranial nerves without deficit, no facial droop Moves extremities without ataxia, coordination intact  Psychiatric:        Behavior: Behavior normal.    ED Treatments / Results  Labs (all labs ordered are listed, but only abnormal results are displayed) Labs Reviewed  BASIC METABOLIC PANEL - Abnormal; Notable for the following components:      Result Value   CO2 20 (*)    Glucose, Bld 125 (*)    Calcium 8.4 (*)    All other components within normal limits  HEPATIC FUNCTION PANEL - Abnormal; Notable for the following components:   AST 58 (*)    ALT 55 (*)    All other components within normal limits  URINALYSIS, ROUTINE W REFLEX MICROSCOPIC - Abnormal; Notable for the following components:   Color,  Urine AMBER (*)    APPearance HAZY (*)    Protein, ur 30 (*)    All other components within normal limits  CBC  LIPASE, BLOOD  TROPONIN I (HIGH SENSITIVITY)  TROPONIN I (HIGH SENSITIVITY)    EKG EKG Interpretation  Date/Time:  Saturday November 24 2018 09:41:26 EDT Ventricular Rate:  151 PR Interval:    QRS Duration: 76 QT Interval:  294 QTC Calculation: 466 R Axis:   80 Text Interpretation:  tachycardia with short PR Atrial premature complex LAE, consider biatrial enlargement Repol abnrm suggests ischemia, diffuse leads Confirmed by Davonna Belling [phone]) on 11/24/2018 10:01:25 AM   Radiology Ct Abdomen Pelvis Wo Contrast  Result Date: 11/24/2018 CLINICAL DATA:  48 year old male with epigastric pain, nausea and vomiting EXAM: CT ABDOMEN AND PELVIS WITHOUT CONTRAST TECHNIQUE: Multidetector CT imaging of the abdomen and pelvis was performed following the standard protocol without IV contrast. COMPARISON:  CT scan of the abdomen and pelvis 10/14/2018 FINDINGS: Lower chest: The lung bases are clear. Visualized cardiac structures are within normal limits for size. No pericardial effusion. Unremarkable visualized distal thoracic esophagus. Hepatobiliary: Normal hepatic contour and morphology. No discrete hepatic lesions. Normal appearance of the gallbladder. No intra or extrahepatic biliary ductal dilatation. Pancreas: The pancreas is abnormal. Multifocal low-attenuation fluid collections are present within the pancreatic tail. Coarse calcifications are present in the body, head and uncinate process. Overall, this appearance is unchanged compared to prior imaging. No significant inflammatory changes in the peripancreatic fat. Spleen: The spleen remains atretic.  No acute abnormality. Adrenals/Urinary Tract: Unremarkable adrenal glands. No hydronephrosis,  nephrolithiasis or renal contour abnormality. The ureters and bladder are unremarkable. Stomach/Bowel: Solitary loop of mildly dilated small  bowel in the mid abdomen without a focal transition point. This abnormality is also evident on the scout images and may represent local enteritis. There is a suggestion of very mild thickening of the valvulae. No evidence of obstruction. Similar appearance of the stomach with diffuse thickening along the wall of the greater curvature. Vascular/Lymphatic: Limited evaluation in the absence of intravenous contrast. Atherosclerotic calcifications present in the abdominal aorta. No suspicious lymphadenopathy. Reproductive: Prostate is unremarkable. Other: No abdominal wall hernia or ascites. Musculoskeletal: No acute fracture or aggressive appearing lytic or blastic osseous lesion. L5-S1 degenerative disc disease. IMPRESSION: 1. Solitary loop of mildly dilated and thickened small bowel in the mid abdomen may represent localized enteritis. Both infectious, and less likely inflammatory etiologies are considerations. 2. Sequelae of chronic pancreatitis without significant interval change or imaging evidence of acute inflammation. 3. Similar appearance of the stomach with extensive wall thickening along the greater curvature. While the imaging findings remain suspicious for gastritis, patient underwent upper endoscopy on 11/14/2018 which was negative. 4.  Aortic Atherosclerosis (ICD10-170.0) Electronically Signed   By: Jacqulynn Cadet M.D.   On: 11/24/2018 11:02   Dg Chest Port 1 View  Result Date: 11/24/2018 CLINICAL DATA:  48 year old presenting with acute onset of chest pain, generalized abdominal pain and hematemesis. EXAM: PORTABLE CHEST 1 VIEW COMPARISON:  11/17/2018 and earlier. FINDINGS: Cardiomediastinal silhouette unremarkable and unchanged. Stable hyperinflation. Lungs clear. Bronchovascular markings normal. Pulmonary vascularity normal. No visible pleural effusions. No pneumothorax. No interval change. IMPRESSION: Stable hyperinflation indicating COPD and/or asthma. No acute cardiopulmonary disease.  Electronically Signed   By: Evangeline Dakin M.D.   On: 11/24/2018 10:19    Procedures Procedures (including critical care time)  Medications Ordered in ED Medications  sodium chloride 0.9 % bolus 1,000 mL (0 mLs Intravenous Stopped 11/24/18 1431)  morphine 4 MG/ML injection 4 mg (4 mg Intravenous Given 11/24/18 1010)  ondansetron (ZOFRAN) injection 4 mg (4 mg Intravenous Given 11/24/18 1010)  alum & mag hydroxide-simeth (MAALOX/MYLANTA) 200-200-20 MG/5ML suspension 30 mL (30 mLs Oral Given 11/24/18 1010)    And  lidocaine (XYLOCAINE) 2 % viscous mouth solution 15 mL (15 mLs Oral Given 11/24/18 1010)  pantoprazole (PROTONIX) injection 40 mg (40 mg Intravenous Given 11/24/18 1220)     Initial Impression / Assessment and Plan / ED Course  I have reviewed the triage vital signs and the nursing notes.  Pertinent labs & imaging results that were available during my care of the patient were reviewed by me and considered in my medical decision making (see chart for details).    CBC within normal limits, hemoglobin 14.1 BMP nonacute Lipase within normal limits Urinalysis nonacute LFTs with mildly elevated AST/ALT, appear improved from previous Initial troponin 9:46 AM: 5 Delta troponin 12:07 PM: 6 EKG: tachycardia with short PR Atrial premature complex LAE, consider biatrial enlargement Repol abnrm suggests ischemia, diffuse leads Confirmed by Davonna Belling [phone]) on 11/24/2018 10:01:25 AM  CXR:  IMPRESSION:  Stable hyperinflation indicating COPD and/or asthma. No acute  cardiopulmonary disease.   CT Abd/Pelvis:  IMPRESSION:  1. Solitary loop of mildly dilated and thickened small bowel in the  mid abdomen may represent localized enteritis. Both infectious, and  less likely inflammatory etiologies are considerations.  2. Sequelae of chronic pancreatitis without significant interval  change or imaging evidence of acute inflammation.  3. Similar appearance of the stomach with  extensive  wall thickening  along the greater curvature. While the imaging findings remain  suspicious for gastritis, patient underwent upper endoscopy on  11/14/2018 which was negative.  4. Aortic Atherosclerosis (ICD10-170.0)  - Fluid bolus given, pain controlled.  Repeat EKG reviewed with Dr. Alvino Chapel, improved rate, no indication for medications regarding WPW suspect tachycardia secondary to pain and dehydration. He is now ambulating without assistance around the emergency department, eating and drinking without difficulty.  Will treat patient as enteritis empirically with azithromycin 500 mg daily x5 days.  Zofran every 8 as needed for nausea and vomiting and Carafate and Protonix for gastritis.  GI referral given and follow-up encouraged. Plan of care discussed with Dr. Alvino Chapel who agrees with discharge and plan at this time.  At this time there does not appear to be any evidence of an acute emergency medical condition and the patient appears stable for discharge with appropriate outpatient follow up. Diagnosis was discussed with patient who verbalizes understanding of care plan and is agreeable to discharge. I have discussed return precautions with patient and who verbalizes understanding of return precautions. Patient encouraged to follow-up with their PCP and gastroenterology. All questions answered. Patient has been discharged in good condition.  Patient's case rediscussed with Dr. Alvino Chapel who agrees with plan to discharge with follow-up.   Note: Portions of this report may have been transcribed using voice recognition software. Every effort was made to ensure accuracy; however, inadvertent computerized transcription errors may still be present. Final Clinical Impressions(s) / ED Diagnoses   Final diagnoses:  Gastritis, presence of bleeding unspecified, unspecified chronicity, unspecified gastritis type  Enteritis    ED Discharge Orders         Ordered    ondansetron (ZOFRAN) 4  MG tablet  Every 8 hours PRN     11/24/18 1442    azithromycin (ZITHROMAX) 500 MG tablet  Daily     11/24/18 1442    sucralfate (CARAFATE) 1 g tablet  3 times daily with meals & bedtime     11/24/18 1442    pantoprazole (PROTONIX) 20 MG tablet  Daily     11/24/18 1458           Gari Crown 11/24/18 1537    Davonna Belling, MD 11/25/18 [phone]

## 2018-11-24 NOTE — ED Notes (Signed)
Pt verbalized understanding of d/c instructions and has no further questions. Supposed to follow up with GI.

## 2018-11-24 NOTE — Discharge Instructions (Addendum)
You have been diagnosed today with gastritis and enteritis.  At this time there does not appear to be the presence of an emergent medical condition, however there is always the potential for conditions to change. Please read and follow the below instructions.  Please return to the Emergency Department immediately for any new or worsening symptoms or if your symptoms do not improve within 2 days. Please be sure to follow up with your Primary Care Provider within one week regarding your visit today; please call their office to schedule an appointment even if you are feeling better for a follow-up visit. Please take Protonix as prescribed to help with your gastritis, once daily in the morning. Please take the azithromycin for treatment of possible infectious enteritis, 1 pill daily for the next 5 days. Please take the medication Carafate as prescribed to help with your gastritis. You may use the medication Zofran as prescribed to help with nausea and vomiting. Please follow-up with your gastroenterologist as instructed, call their office on Monday to schedule a follow-up appointment.  If you do not have a gastroenterologist you may call the specialist at [facility] gastroenterology to schedule a follow-up appointment. Additionally cutting back on your alcohol use should help with your gastritis, you may go to [facility] as you are planning to get help with your alcohol use. Please be sure to drink plenty of water to help with your dehydration.  Get help right away if: You throw up blood or something that looks like coffee grounds. You have black or dark red poop. You throw up any time you try to drink fluids. Your stomach pain gets worse. You have a fever. You have chest pain. You feel very weak. You pass out (faint). You see blood in your throw-up. Your throw-up looks like coffee grounds. You have bloody or black poop (stools) or poop that looks like tar. You have a very bad headache, or a stiff  neck, or both. You have a rash. You have very bad pain, cramping, or bloating in your belly. You have trouble breathing. You are breathing very quickly. You have a fast heartbeat. Your skin feels cold and clammy. You feel mixed up (confused). You have pain when you pee. You have signs of not having enough water in the body, such as: Dark pee, hardly any pee, or no pee. Cracked lips. Dry mouth. Sunken eyes. Feeling very sleepy. Feeling weak. Any new/concerning or worsening symptoms  Please read the additional information packets attached to your discharge summary.  Do not take your medicine if  develop an itchy rash, swelling in your mouth or lips, or difficulty breathing; call 911 and seek immediate emergency medical attention if this occurs.

## 2018-11-24 NOTE — ED Triage Notes (Signed)
Pt reports CP and ABD pain with blood in stool .

## 2018-11-24 NOTE — ED Notes (Signed)
Pt. Aware that urine is needed.

## 2018-12-24 ENCOUNTER — Other Ambulatory Visit: Payer: Self-pay

## 2018-12-24 ENCOUNTER — Encounter (HOSPITAL_COMMUNITY): Payer: Self-pay | Admitting: Oncology

## 2018-12-24 ENCOUNTER — Emergency Department (HOSPITAL_COMMUNITY)
Admission: EM | Admit: 2018-12-24 | Discharge: 2018-12-24 | Disposition: A | Discharge status: Home / Self Care | Payer: Self-pay | Attending: Emergency Medicine | Admitting: Emergency Medicine

## 2018-12-24 DIAGNOSIS — F17210 Nicotine dependence, cigarettes, uncomplicated: Secondary | ICD-10-CM | POA: Insufficient documentation

## 2018-12-24 DIAGNOSIS — R079 Chest pain, unspecified: Secondary | ICD-10-CM

## 2018-12-24 DIAGNOSIS — J45909 Unspecified asthma, uncomplicated: Secondary | ICD-10-CM | POA: Insufficient documentation

## 2018-12-24 DIAGNOSIS — I10 Essential (primary) hypertension: Secondary | ICD-10-CM | POA: Insufficient documentation

## 2018-12-24 DIAGNOSIS — R1013 Epigastric pain: Secondary | ICD-10-CM | POA: Insufficient documentation

## 2018-12-24 DIAGNOSIS — R0789 Other chest pain: Secondary | ICD-10-CM | POA: Insufficient documentation

## 2018-12-24 LAB — COMPREHENSIVE METABOLIC PANEL
ALT: 31 U/L (ref 0–44)
AST: 59 U/L — ABNORMAL HIGH (ref 15–41)
Albumin: 4 g/dL (ref 3.5–5.0)
Alkaline Phosphatase: 91 U/L (ref 38–126)
Anion gap: 14 (ref 5–15)
BUN: 11 mg/dL (ref 6–20)
CO2: 19 mmol/L — ABNORMAL LOW (ref 22–32)
Calcium: 8.9 mg/dL (ref 8.9–10.3)
Chloride: 103 mmol/L (ref 98–111)
Creatinine, Ser: 0.7 mg/dL (ref 0.61–1.24)
GFR calc Af Amer: >60 mL/min (ref >60)
GFR calc non Af Amer: >60 mL/min (ref >60)
Glucose, Bld: 93 mg/dL (ref 70–99)
Potassium: 4.8 mmol/L (ref 3.5–5.1)
Sodium: 136 mmol/L (ref 135–145)
Total Bilirubin: 1.2 mg/dL (ref 0.3–1.2)
Total Protein: 7.4 g/dL (ref 6.5–8.1)

## 2018-12-24 LAB — CBC WITH DIFFERENTIAL/PLATELET
Abs Immature Granulocytes: 0.03 10*3/uL (ref 0.00–0.07)
Basophils Absolute: 0 10*3/uL (ref 0.0–0.1)
Basophils Relative: 0 %
Eosinophils Absolute: 0.1 10*3/uL (ref 0.0–0.5)
Eosinophils Relative: 2 %
HCT: 33.3 % — ABNORMAL LOW (ref 39.0–52.0)
Hemoglobin: 11.4 g/dL — ABNORMAL LOW (ref 13.0–17.0)
Immature Granulocytes: 1 %
Lymphocytes Relative: 23 %
Lymphs Abs: 1.2 10*3/uL (ref 0.7–4.0)
MCH: 31.8 pg (ref 26.0–34.0)
MCHC: 34.2 g/dL (ref 30.0–36.0)
MCV: 92.8 fL (ref 80.0–100.0)
Monocytes Absolute: 0.4 10*3/uL (ref 0.1–1.0)
Monocytes Relative: 8 %
Neutro Abs: 3.6 10*3/uL (ref 1.7–7.7)
Neutrophils Relative %: 66 %
Platelets: 260 10*3/uL (ref 150–400)
RBC: 3.59 MIL/uL — ABNORMAL LOW (ref 4.22–5.81)
RDW: 16.8 % — ABNORMAL HIGH (ref 11.5–15.5)
WBC: 5.4 10*3/uL (ref 4.0–10.5)
nRBC: 0 % (ref 0.0–0.2)

## 2018-12-24 LAB — TROPONIN I (HIGH SENSITIVITY)
Troponin I (High Sensitivity): 7 ng/L (ref <18)
Troponin I (High Sensitivity): 7 ng/L (ref <18)

## 2018-12-24 LAB — LIPASE, BLOOD: Lipase: 61 U/L — ABNORMAL HIGH (ref 11–51)

## 2018-12-24 IMAGING — DX DG CHEST 2V
2 series · 2 of 2 positions shown · non-contrast
Comparison: PA and lateral chest 02/20/2016 and 03/05/2015. CT
chest 11/26/2014.

CLINICAL DATA: Shortness of breath left chest pain beginning this
morning.

EXAM:
CHEST  2 VIEW

[chest pa]
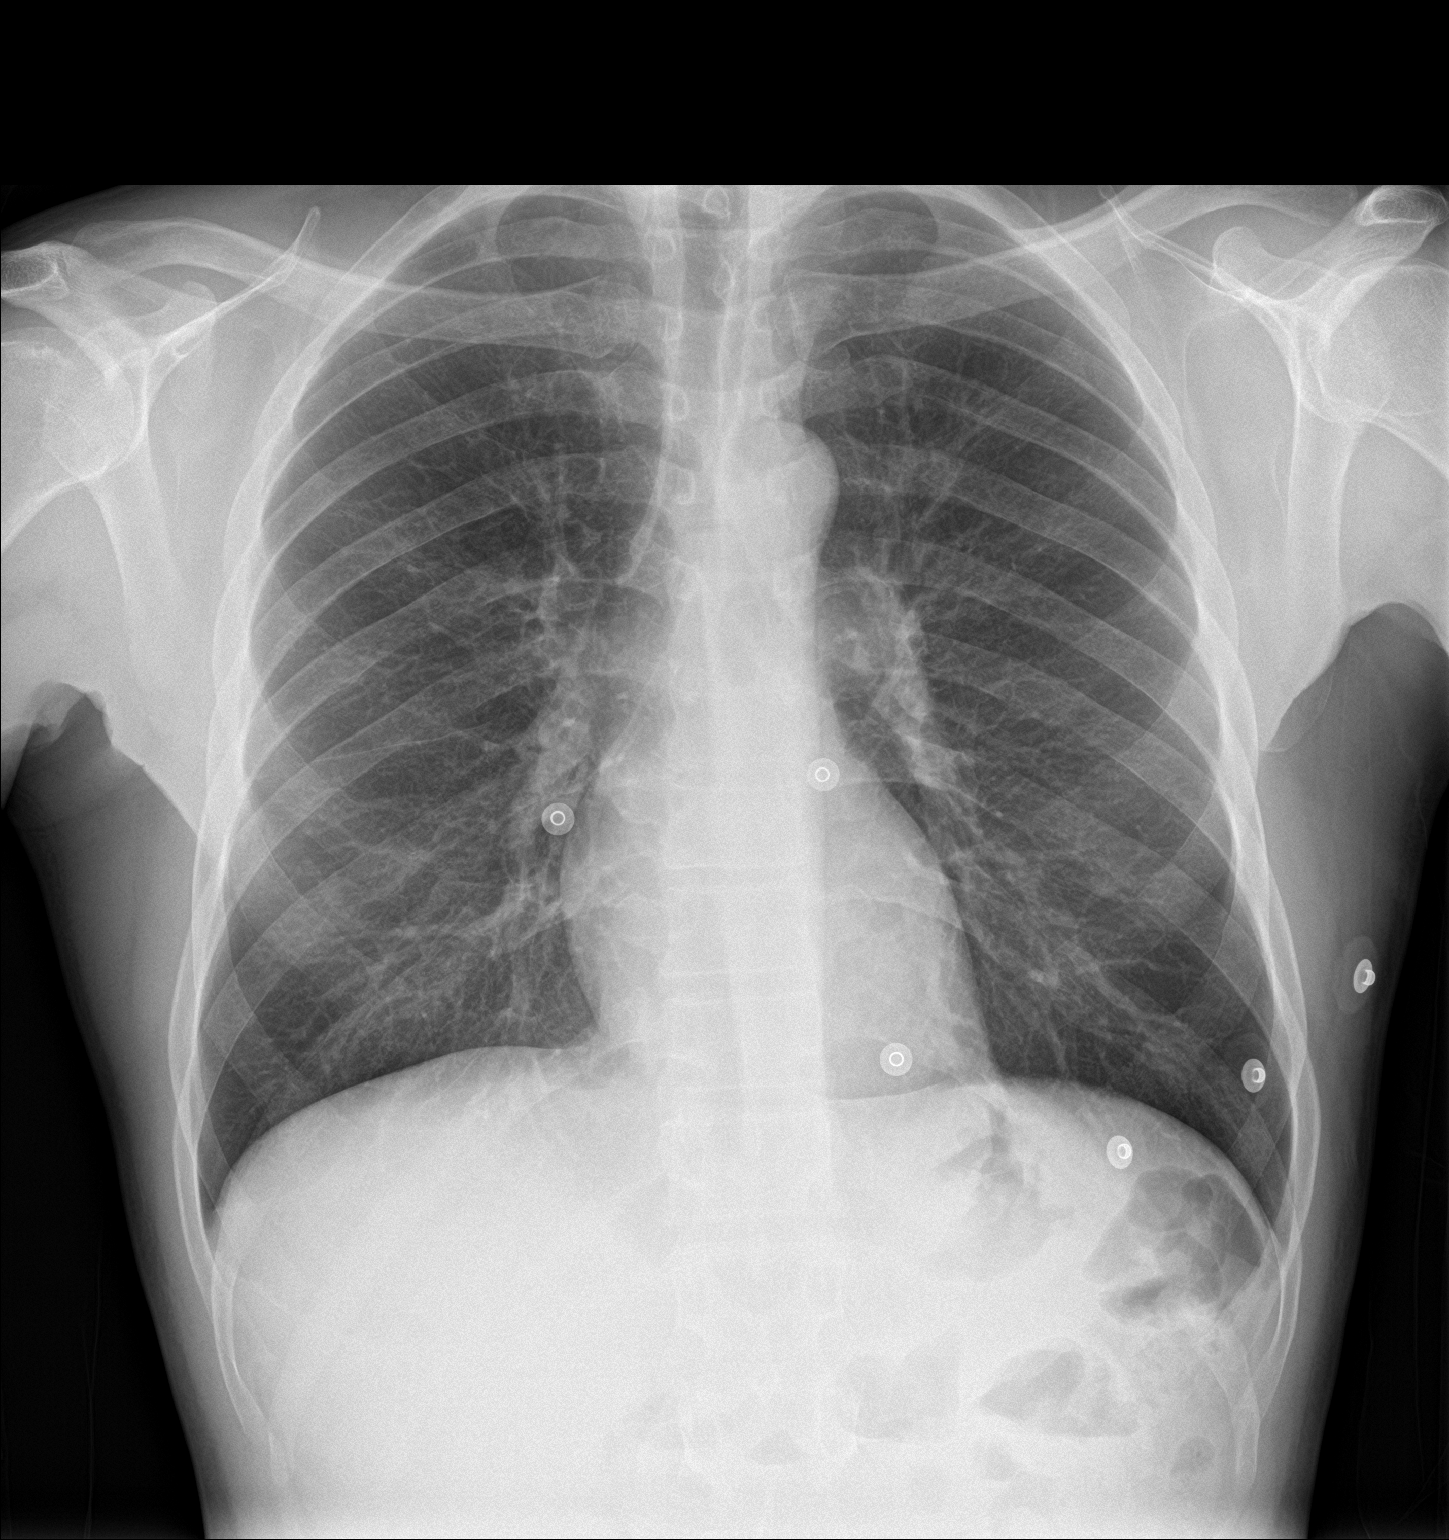

[chest lat]
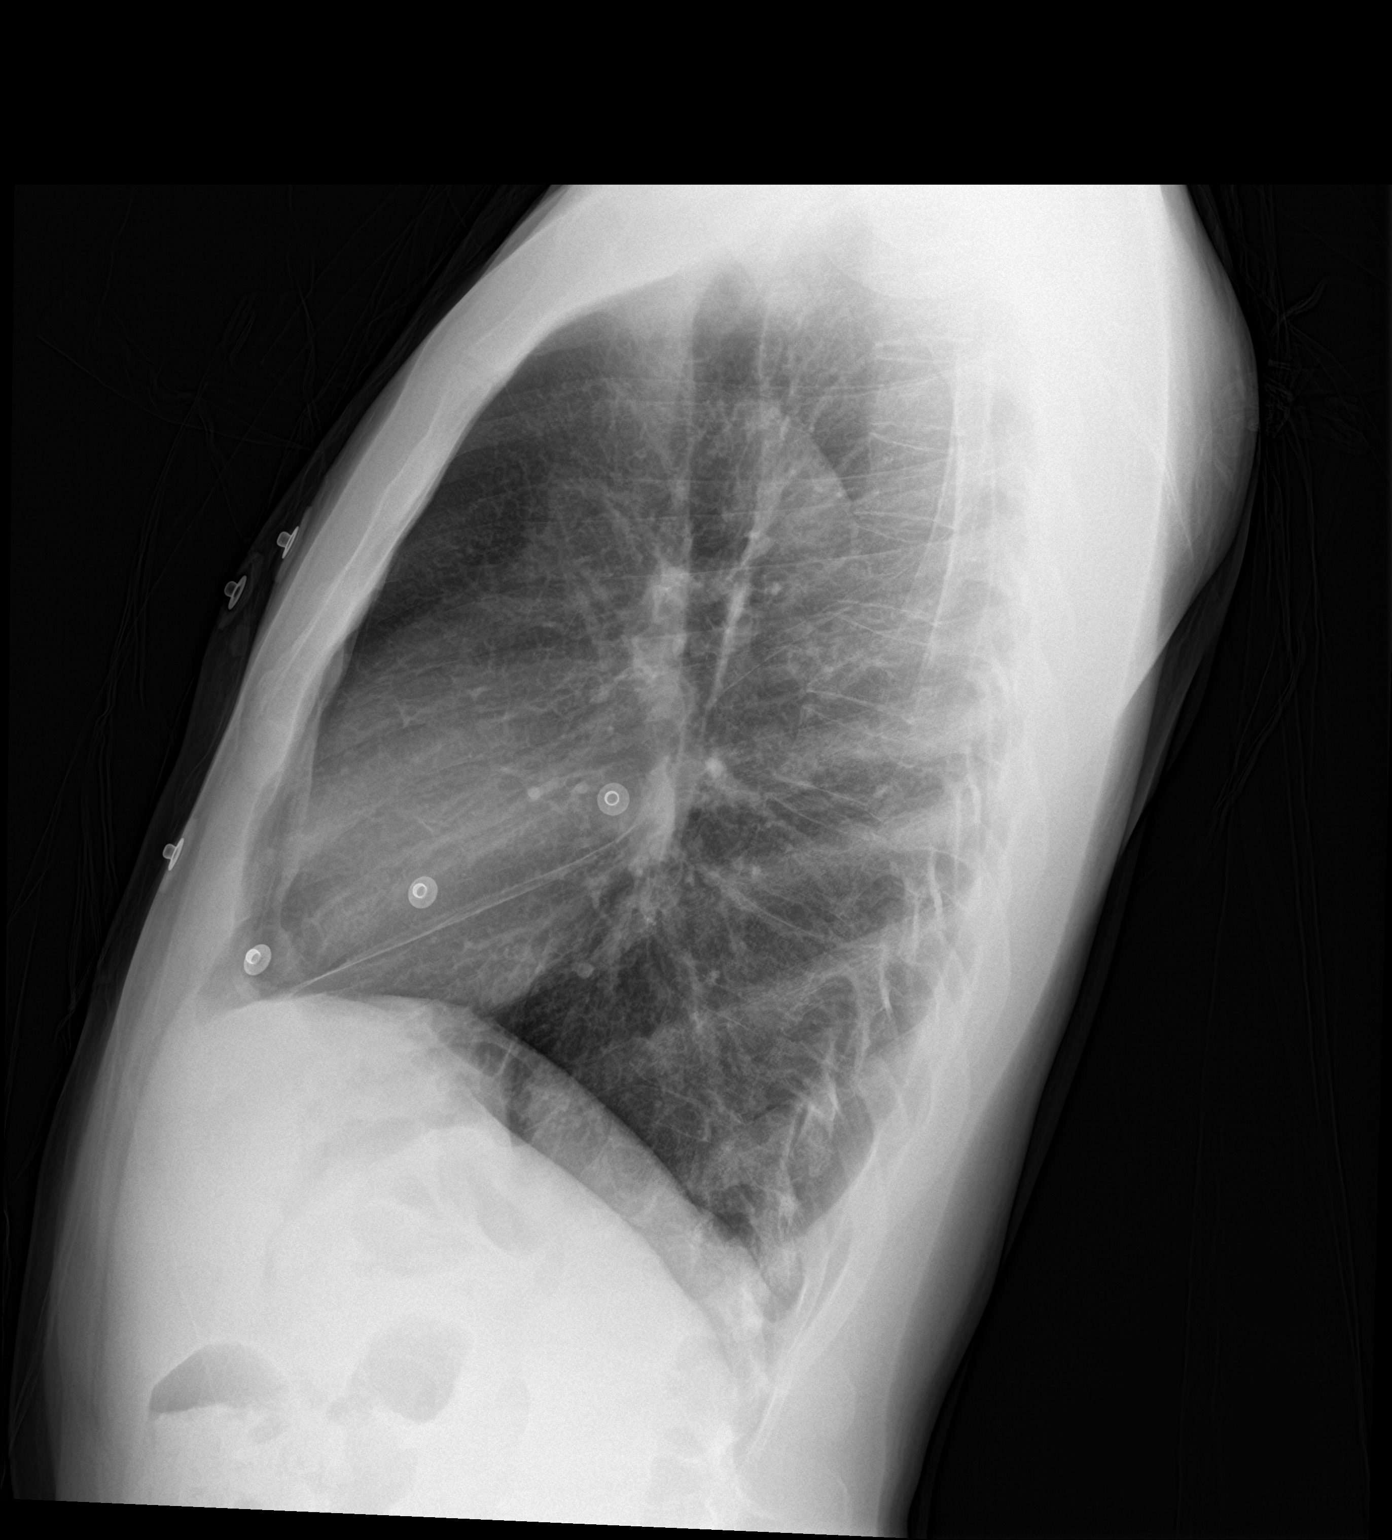

[2 of 2 positions shown; findings below may reference images not displayed]

FINDINGS: Emphysematous change is seen in the apices. Lungs are clear. Heart
size is normal. No pneumothorax or pleural effusion.
IMPRESSION: Emphysematous disease in the apices.  No acute disease.

## 2018-12-24 MED ORDER — ALUM & MAG HYDROXIDE-SIMETH 200-200-20 MG/5ML PO SUSP
30.0000 mL | Freq: Once | ORAL | Status: AC
  Administered 2018-12-24: 30 mL via ORAL
  Filled 2018-12-24: qty 30

## 2018-12-24 MED ORDER — LIDOCAINE VISCOUS HCL 2 % MT SOLN
15.0000 mL | Freq: Once | OROMUCOSAL | Status: AC
  Administered 2018-12-24: 02:00:00 15 mL via OROMUCOSAL
  Filled 2018-12-24: qty 15

## 2018-12-24 NOTE — ED Notes (Signed)
Pt has removed all monitoring equipment.

## 2018-12-24 NOTE — ED Notes (Signed)
Pt got up and walked to the door, I asked him to get back into bed. Pt wanted to ask doctor for pain medication. After returning to bed he has began yelling " OW", "OUCH" and moaning like he is in pain. When I walked back past room pt was playing on his phone while continuing to make noises.

## 2018-12-24 NOTE — ED Triage Notes (Signed)
Pt bib [facility] d/t sudden onset left sided CP w/ radiation to left jaw and left abdomen.  Pt given 4 mg IV morphine en route.  Took viagra, no nitro given.

## 2018-12-24 NOTE — ED Provider Notes (Signed)
Emergency Department Provider Note   I have reviewed the triage vital signs and the nursing notes.   HISTORY  Chief Complaint Chest Pain   HPI [name] is a 48 y.o. male who is well-known to our department for acute exacerbations of his chronic abdominal pain chronic pancreatitis presents the emergency department via EMS secondary to the same.  Patient states that he ate a chicken soup and started having some abdominal pain he threw up and it got better but then the pain came back this evening has seems to be epigastric in nature radiates up to his left chest and left throat.  He throws up anything he tries to eat or drink.  No diarrhea or constipation.  No fevers or rashes.  No trauma.  Patient states this is similar to his previous episodes but seems to be a lot worse pain.  States compliance with his medications however they were filled July 27 and appear to have full prescriptions left in the bottles.  Denies any alcohol use today.  No drugs.   No other associated or modifying symptoms.    Past Medical History:  Diagnosis Date  . Alcoholism /alcohol abuse ([place])   . Anemia   . Anxiety   . Arthritis    "knees; arms; elbows" (03/26/2015)  . Asthma   . Bipolar disorder ([place])   . Chronic bronchitis ([place])   . Chronic lower back pain   . Chronic pancreatitis ([place])   . Cocaine abuse ([place])   . Depression   . Family history of adverse reaction to anesthesia    "grandmother gets confused"  . Femoral condyle fracture ([place]) 03/08/2014   left medial/notes 03/09/2014  . GERD (gastroesophageal reflux disease)   . H/O hiatal hernia   . H/O suicide attempt 10/2012  . High cholesterol   . History of blood transfusion 10/2012   "when I tried to commit suicide"  . History of stomach ulcers   . Hypertension   . Marijuana abuse, continuous   . Migraine    "a few times/year" (03/26/2015)  . Pneumonia 1990's X 3  . PTSD (post-traumatic stress disorder)   . Sickle cell trait  ([place])   . WPW (Wolff-Parkinson-White syndrome)    [name] 03/06/2013    Patient Active Problem List   Diagnosis Date Noted  . AKI (acute kidney injury) ([place]) 11/13/2018  . Seizure ([place]) 11/13/2018  . Gastritis and gastroduodenitis   . Chest pain 01/08/2018  . GI bleed 11/24/2017  . Acute blood loss anemia 11/24/2017  . Atypical chest pain 11/24/2017  . Acute pancreatitis 09/28/2017  . Abdominal pain 05/27/2017  . Hematemesis 05/27/2017  . Tachycardia 03/18/2017  . Diarrhea 03/18/2017  . Acute on chronic pancreatitis ([place]) 12/17/2016  . Intractable nausea and vomiting 12/05/2016  . Verbally abusive behavior 12/05/2016  . Normocytic anemia 12/05/2016  . Alcohol use disorder, severe, dependence ([place]) 07/25/2016  . Cocaine use disorder, severe, dependence ([place]) 07/25/2016  . Major depressive disorder, recurrent severe without psychotic features ([place]) 07/20/2016  . Leukocytosis   . Hospital acquired PNA 05/20/2015  . Chronic pancreatitis ([place]) 05/18/2015  . Pseudocyst of pancreas 05/18/2015  . Polysubstance abuse (tobacco, cocaine, THC, and ETOH) 03/26/2015  . Alcohol-induced chronic pancreatitis ([place])   . Benign essential HTN 02/06/2014  . Alcohol-induced acute pancreatitis 11/28/2013  . Pancreatic pseudocyst/cyst 11/25/2013  . Severe protein-calorie malnutrition ([place]) 10/10/2013  . Suicide attempt ([place]) 10/08/2013  . 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  TOBACCO ABUSE 03/23/2007    Past Surgical History:  Procedure Laterality Date  . BIOPSY  11/25/2017   Procedure: BIOPSY;  Surgeon: Arta Silence, MD;  Location: [facility] ENDOSCOPY;  Service: Endoscopy;;  . BIOPSY  10/14/2018   Procedure: BIOPSY;  Surgeon: Arta Silence, MD;  Location: [place];  Service: Endoscopy;;  . CARDIAC CATHETERIZATION    . ESOPHAGOGASTRODUODENOSCOPY (EGD) WITH PROPOFOL N/A 11/25/2017   Procedure: ESOPHAGOGASTRODUODENOSCOPY (EGD) WITH PROPOFOL;  Surgeon: Arta Silence, MD;  Location: [place];  Service: Endoscopy;  Laterality: N/A;  . ESOPHAGOGASTRODUODENOSCOPY (EGD) WITH PROPOFOL Left 10/14/2018   Procedure: ESOPHAGOGASTRODUODENOSCOPY (EGD) WITH PROPOFOL;  Surgeon: Arta Silence, MD;  Location: [facility] ENDOSCOPY;  Service: Endoscopy;  Laterality: Left;  . ESOPHAGOGASTRODUODENOSCOPY (EGD) WITH PROPOFOL N/A 11/14/2018   Procedure: ESOPHAGOGASTRODUODENOSCOPY (EGD) WITH PROPOFOL;  Surgeon: Laurence Spates, MD;  Location: [facility] ENDOSCOPY;  Service: Gastroenterology;  Laterality: N/A;  . EYE SURGERY Left 1990's   "result of trauma"   . FACIAL FRACTURE SURGERY Left 1990's   "result of trauma"   . FRACTURE SURGERY    . HERNIA REPAIR    . LEFT HEART CATHETERIZATION WITH CORONARY ANGIOGRAM Right 03/07/2013   Procedure: LEFT HEART CATHETERIZATION WITH CORONARY ANGIOGRAM;  Surgeon: [name], MD;  Location: [place] CATH LAB;  Service: Cardiovascular;  Laterality: Right;  . UMBILICAL HERNIA REPAIR      Current Outpatient Rx  . Order #: [id number] Class: Historical Med  . Order #: [id number] Class: Normal  . Order #: [id number] Class: Historical Med  . Order #: [id number] Class: Normal  . Order #: [id number] Class: Normal  . Order #: [id number] Class: Historical Med  . Order #: [id number] Class: Normal  . Order #: [id number] Class: No Print  . Order #: [id number] Class: Historical Med  . Order #: [id number] Class: Historical Med  . Order #: [id number] Class: Print  . Order #: [id number] Class: Normal  . Order #: [id number] Class: Historical Med  . Order #: [id number] Class: Print  . Order #: [id number] Class: Historical Med  . Order #: [id number] Class: Historical Med  . Order #: [id number] Class: Normal  . Order #: [id number] Class: Print  . Order #: [id number] Class: Normal    Allergies Robaxin [methocarbamol], Shellfish-derived products, Trazodone, Trazodone and nefazodone, Adhesive [tape], Latex, Toradol [ketorolac tromethamine], Contrast media [iodinated diagnostic agents], and Reglan  [metoclopramide]  Family History  Problem Relation Age of Onset  . Hypertension Mother   . Cirrhosis Mother   . Alcoholism Mother   . Hypertension Father   . Melanoma Father   . Hypertension Other   . Coronary artery disease Other     Social History Social History   Tobacco Use  . Smoking status: Current Every Day Smoker    Packs/day: 1.00    Years: 33.00    Pack years: 33.00    Types: Cigarettes, E-cigarettes  . Smokeless tobacco: Never Used  Substance Use Topics  . Alcohol use: Yes  . Drug use: Yes    Types: Marijuana, Cocaine    Comment: daily marijuana use; last cocaine use about 3 months ago    Review of Systems  All other systems negative except as documented in the HPI. All pertinent positives and negatives as reviewed in the HPI. ____________________________________________   PHYSICAL EXAM:  VITAL SIGNS: ED Triage Vitals  Enc Vitals Group     BP 12/24/18 0130 (!) 168/110     Pulse Rate 12/24/18 0130 (!) 109     Resp 12/24/18 0130 11     Temp 12/24/18 0130 97.9 F (36.6 C)     Temp  Source 12/24/18 0130 Oral     SpO2 12/24/18 0130 99 %     Weight 12/24/18 0132 140 lb (63.5 kg)     Height 12/24/18 0132 5\' 9"  (1.753 m)    Constitutional: Alert and oriented. Well appearing and in no acute distress. Eyes: Conjunctivae are normal. PERRL. EOMI. Head: Atraumatic. Nose: No congestion/rhinnorhea. Mouth/Throat: Mucous membranes are moist.  Oropharynx non-erythematous. Neck: No stridor.  No meningeal signs.   Cardiovascular: Normal rate, regular rhythm. Good peripheral circulation. Grossly normal heart sounds.   Respiratory: Normal respiratory effort.  No retractions. Lungs CTAB. Gastrointestinal: Soft and nontender. No distention.  Musculoskeletal: No lower extremity tenderness nor edema. No gross deformities of extremities. Neurologic:  Normal speech and language. No gross focal neurologic deficits are appreciated.  Skin:  Skin is warm, dry and intact. No  rash noted.  ____________________________________________   LABS (all labs ordered are listed, but only abnormal results are displayed)  Labs Reviewed  CBC WITH DIFFERENTIAL/PLATELET - Abnormal; Notable for the following components:      Result Value   RBC 3.59 (*)    Hemoglobin 11.4 (*)    HCT 33.3 (*)    RDW 16.8 (*)    All other components within normal limits  COMPREHENSIVE METABOLIC PANEL - Abnormal; Notable for the following components:   CO2 19 (*)    AST 59 (*)    All other components within normal limits  LIPASE, BLOOD - Abnormal; Notable for the following components:   Lipase 61 (*)    All other components within normal limits  TROPONIN I (HIGH SENSITIVITY)  TROPONIN I (HIGH SENSITIVITY)   ____________________________________________  EKG   EKG Interpretation  Date/Time:  Monday December 24 2018 01:26:17 EDT Ventricular Rate:  111 PR Interval:    QRS Duration: 84 QT Interval:  372 QTC Calculation: 506 R Axis:   75 Text Interpretation:  Sinus tachycardia Right atrial enlargement Prolonged QT interval No significant change since last tracing Confirmed by Merrily Pew [phone]) on 12/24/2018 1:41:04 AM       ____________________________________________  RADIOLOGY  No results found.  ____________________________________________   PROCEDURES  Procedure(s) performed:   Procedures   ____________________________________________   INITIAL IMPRESSION / ASSESSMENT AND PLAN / ED COURSE  Will eval for any acute changes as he states this pain is so much worst than previous episodes. ECG looks ok.   Workup unremarkable. Delta troponins for completeness and looked ok. Plan for discharge for same. toleratign PO. Seems to be exhibiting some drug seeking behavior. Will dc to follow up with PCP PRN.   Pertinent labs & imaging results that were available during my care of the patient were reviewed by me and considered in my medical decision making (see chart for  details).   A medical screening exam was performed and I feel the patient has had an appropriate workup for their chief complaint at this time and likelihood of emergent condition existing is low. They have been counseled on decision, discharge, follow up and which symptoms necessitate immediate return to the emergency department. They or their family verbally stated understanding and agreement with plan and discharged in stable condition.   ____________________________________________  FINAL CLINICAL IMPRESSION(S) / ED DIAGNOSES  Final diagnoses:  Nonspecific chest pain  Epigastric pain     MEDICATIONS GIVEN DURING THIS VISIT:  Medications  alum & mag hydroxide-simeth (MAALOX/MYLANTA) 200-200-20 MG/5ML suspension 30 mL (30 mLs Oral Given 12/24/18 0159)  lidocaine (XYLOCAINE) 2 % viscous mouth solution 15 mL (15  mLs Mouth/Throat Given 12/24/18 0200)     NEW OUTPATIENT MEDICATIONS STARTED DURING THIS VISIT:  Discharge Medication List as of 12/24/2018  5:18 AM      Note:  This note was prepared with assistance of Dragon voice recognition software. Occasional wrong-word or sound-a-like substitutions may have occurred due to the inherent limitations of voice recognition software.   Merland Holness, Corene Cornea, MD 12/24/18 [phone]

## 2018-12-25 ENCOUNTER — Emergency Department (HOSPITAL_COMMUNITY): Payer: Self-pay

## 2018-12-25 ENCOUNTER — Other Ambulatory Visit: Payer: Self-pay

## 2018-12-25 ENCOUNTER — Emergency Department (HOSPITAL_COMMUNITY)
Admission: EM | Admit: 2018-12-25 | Discharge: 2018-12-26 | Disposition: A | Discharge status: Home / Self Care | Payer: Self-pay | Attending: Emergency Medicine | Admitting: Emergency Medicine

## 2018-12-25 DIAGNOSIS — R109 Unspecified abdominal pain: Secondary | ICD-10-CM | POA: Insufficient documentation

## 2018-12-25 DIAGNOSIS — Z91040 Latex allergy status: Secondary | ICD-10-CM | POA: Insufficient documentation

## 2018-12-25 DIAGNOSIS — Z20828 Contact with and (suspected) exposure to other viral communicable diseases: Secondary | ICD-10-CM | POA: Insufficient documentation

## 2018-12-25 DIAGNOSIS — Z79899 Other long term (current) drug therapy: Secondary | ICD-10-CM | POA: Insufficient documentation

## 2018-12-25 DIAGNOSIS — J45909 Unspecified asthma, uncomplicated: Secondary | ICD-10-CM | POA: Insufficient documentation

## 2018-12-25 DIAGNOSIS — R251 Tremor, unspecified: Secondary | ICD-10-CM | POA: Insufficient documentation

## 2018-12-25 DIAGNOSIS — G8929 Other chronic pain: Secondary | ICD-10-CM | POA: Insufficient documentation

## 2018-12-25 DIAGNOSIS — I10 Essential (primary) hypertension: Secondary | ICD-10-CM | POA: Insufficient documentation

## 2018-12-25 DIAGNOSIS — F17210 Nicotine dependence, cigarettes, uncomplicated: Secondary | ICD-10-CM | POA: Insufficient documentation

## 2018-12-25 LAB — BASIC METABOLIC PANEL
Anion gap: 12 (ref 5–15)
BUN: 9 mg/dL (ref 6–20)
CO2: 19 mmol/L — ABNORMAL LOW (ref 22–32)
Calcium: 9 mg/dL (ref 8.9–10.3)
Chloride: 102 mmol/L (ref 98–111)
Creatinine, Ser: 0.65 mg/dL (ref 0.61–1.24)
GFR calc Af Amer: >60 mL/min (ref >60)
GFR calc non Af Amer: >60 mL/min (ref >60)
Glucose, Bld: 110 mg/dL — ABNORMAL HIGH (ref 70–99)
Potassium: 3.5 mmol/L (ref 3.5–5.1)
Sodium: 133 mmol/L — ABNORMAL LOW (ref 135–145)

## 2018-12-25 LAB — CBC
HCT: 34.2 % — ABNORMAL LOW (ref 39.0–52.0)
Hemoglobin: 11.4 g/dL — ABNORMAL LOW (ref 13.0–17.0)
MCH: 31.8 pg (ref 26.0–34.0)
MCHC: 33.3 g/dL (ref 30.0–36.0)
MCV: 95.5 fL (ref 80.0–100.0)
Platelets: 257 10*3/uL (ref 150–400)
RBC: 3.58 MIL/uL — ABNORMAL LOW (ref 4.22–5.81)
RDW: 16.9 % — ABNORMAL HIGH (ref 11.5–15.5)
WBC: 9.3 10*3/uL (ref 4.0–10.5)
nRBC: 0 % (ref 0.0–0.2)

## 2018-12-25 LAB — TROPONIN I (HIGH SENSITIVITY): Troponin I (High Sensitivity): 5 ng/L (ref <18)

## 2018-12-25 MED ORDER — SODIUM CHLORIDE 0.9% FLUSH
3.0000 mL | Freq: Once | INTRAVENOUS | Status: AC
  Administered 2018-12-26: 3 mL via INTRAVENOUS

## 2018-12-25 NOTE — ED Triage Notes (Signed)
Pt here for evaluation of substernal chest pain. Endorses N/v 45 mins prior to chest pain. No ASA given d/t allergy, nitro withheld d/t pt taking Viagra earlier today.

## 2018-12-26 ENCOUNTER — Telehealth: Payer: Self-pay | Admitting: *Deleted

## 2018-12-26 LAB — LIPASE, BLOOD: Lipase: 40 U/L (ref 11–51)

## 2018-12-26 LAB — SARS CORONAVIRUS 2 (TAT 6-24 HRS): SARS Coronavirus 2: NEGATIVE

## 2018-12-26 MED ORDER — LIDOCAINE VISCOUS HCL 2 % MT SOLN
15.0000 mL | Freq: Once | OROMUCOSAL | Status: AC
  Administered 2018-12-26: 15 mL via ORAL
  Filled 2018-12-26: qty 15

## 2018-12-26 MED ORDER — LORAZEPAM 1 MG PO TABS
1.0000 mg | ORAL_TABLET | Freq: Once | ORAL | Status: AC
  Administered 2018-12-26: 01:00:00 1 mg via ORAL
  Filled 2018-12-26: qty 1

## 2018-12-26 MED ORDER — KETOROLAC TROMETHAMINE 15 MG/ML IJ SOLN
15.0000 mg | Freq: Once | INTRAMUSCULAR | Status: AC
  Administered 2018-12-26: 15 mg via INTRAVENOUS
  Filled 2018-12-26: qty 1

## 2018-12-26 MED ORDER — ALUM & MAG HYDROXIDE-SIMETH 200-200-20 MG/5ML PO SUSP
30.0000 mL | Freq: Once | ORAL | Status: AC
  Administered 2018-12-26: 30 mL via ORAL
  Filled 2018-12-26: qty 30

## 2018-12-26 MED ORDER — SODIUM CHLORIDE 0.9 % IV BOLUS
1000.0000 mL | Freq: Once | INTRAVENOUS | Status: AC
  Administered 2018-12-26: 01:00:00 1000 mL via INTRAVENOUS

## 2018-12-26 MED ORDER — SUCRALFATE 1 G PO TABS: 1.0000 g | ORAL_TABLET | Freq: Three times a day (TID) | ORAL | 0 refills | Status: DC

## 2018-12-26 MED ORDER — AZITHROMYCIN 250 MG PO TABS: 250.0000 mg | ORAL_TABLET | Freq: Every day | ORAL | 0 refills | Status: DC

## 2018-12-26 NOTE — ED Notes (Signed)
Discharge instructions discussed with pt. Pt verbalized understanding. Pt stable and ambulatory. No signature pad available. 

## 2018-12-26 NOTE — Telephone Encounter (Signed)
EDCM reached out to Reginal Lutes, NP at [facility] to anticipate pt coming in for GI referral.

## 2018-12-26 NOTE — ED Provider Notes (Signed)
[facility] EMERGENCY DEPARTMENT Provider Note   CSN: [id number] Arrival date & time: 12/25/18  1904     History   Chief Complaint Chief Complaint  Patient presents with  . Chest Pain    HPI [name] is a 48 y.o. male.     Patient presents to the emergency department with a chief complaint of abdominal pain.  He has history of the same.  Well-known to the emergency department.  Denies any fever or chills.  Denies nausea or vomiting.  Last alcohol use was 3 days ago.  States that he has been "shaky."  He denies any seizure or DTs.  Has been taking Carafate and Protonix with little relief.  The history is provided by the patient. No language interpreter was used.    Past Medical History:  Diagnosis Date  . Alcoholism /alcohol abuse ([place])   . Anemia   . Anxiety   . Arthritis    "knees; arms; elbows" (03/26/2015)  . Asthma   . Bipolar disorder ([place])   . Chronic bronchitis ([place])   . Chronic lower back pain   . Chronic pancreatitis ([place])   . Cocaine abuse ([place])   . Depression   . Family history of adverse reaction to anesthesia    "grandmother gets confused"  . Femoral condyle fracture ([place]) 03/08/2014   left medial/notes 03/09/2014  . GERD (gastroesophageal reflux disease)   . H/O hiatal hernia   . H/O suicide attempt 10/2012  . High cholesterol   . History of blood transfusion 10/2012   "when I tried to commit suicide"  . History of stomach ulcers   . Hypertension   . Marijuana abuse, continuous   . Migraine    "a few times/year" (03/26/2015)  . Pneumonia 1990's X 3  . PTSD (post-traumatic stress disorder)   . Sickle cell trait ([place])   . WPW (Wolff-Parkinson-White syndrome)    [name] 03/06/2013    Patient Active Problem List   Diagnosis Date Noted  . AKI (acute kidney injury) ([place]) 11/13/2018  . Seizure ([place]) 11/13/2018  . Gastritis and gastroduodenitis   . Chest pain 01/08/2018  . GI bleed 11/24/2017  . Acute blood loss  anemia 11/24/2017  . Atypical chest pain 11/24/2017  . Acute pancreatitis 09/28/2017  . Abdominal pain 05/27/2017  . Hematemesis 05/27/2017  . Tachycardia 03/18/2017  . Diarrhea 03/18/2017  . Acute on chronic pancreatitis ([place]) 12/17/2016  . Intractable nausea and vomiting 12/05/2016  . Verbally abusive behavior 12/05/2016  . Normocytic anemia 12/05/2016  . Alcohol use disorder, severe, dependence ([place]) 07/25/2016  . Cocaine use disorder, severe, dependence ([place]) 07/25/2016  . Major depressive disorder, recurrent severe without psychotic features ([place]) 07/20/2016  . Leukocytosis   . Hospital acquired PNA 05/20/2015  . Chronic pancreatitis ([place]) 05/18/2015  . Pseudocyst of pancreas 05/18/2015  . Polysubstance abuse (tobacco, cocaine, THC, and ETOH) 03/26/2015  . Alcohol-induced chronic pancreatitis ([place])   . Benign essential HTN 02/06/2014  . Alcohol-induced acute pancreatitis 11/28/2013  . Pancreatic pseudocyst/cyst 11/25/2013  . Severe protein-calorie malnutrition ([place]) 10/10/2013  . Suicide attempt ([place]) 10/08/2013  . 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 TOBACCO ABUSE 03/23/2007    Past Surgical History:  Procedure Laterality Date  . BIOPSY  11/25/2017   Procedure: BIOPSY;  Surgeon: Arta Silence, MD;  Location: [facility] ENDOSCOPY;  Service: Endoscopy;;  . BIOPSY  10/14/2018   Procedure: BIOPSY;  Surgeon: Arta Silence, MD;  Location: [place];  Service:  Endoscopy;;  . CARDIAC CATHETERIZATION    . ESOPHAGOGASTRODUODENOSCOPY (EGD) WITH PROPOFOL N/A 11/25/2017   Procedure: ESOPHAGOGASTRODUODENOSCOPY (EGD) WITH PROPOFOL;  Surgeon: Arta Silence, MD;  Location: [place];  Service: Endoscopy;  Laterality: N/A;  . ESOPHAGOGASTRODUODENOSCOPY (EGD) WITH PROPOFOL Left 10/14/2018   Procedure: ESOPHAGOGASTRODUODENOSCOPY (EGD) WITH PROPOFOL;  Surgeon: Arta Silence, MD;  Location: [facility] ENDOSCOPY;  Service: Endoscopy;  Laterality: Left;  .  ESOPHAGOGASTRODUODENOSCOPY (EGD) WITH PROPOFOL N/A 11/14/2018   Procedure: ESOPHAGOGASTRODUODENOSCOPY (EGD) WITH PROPOFOL;  Surgeon: Laurence Spates, MD;  Location: [facility] ENDOSCOPY;  Service: Gastroenterology;  Laterality: N/A;  . EYE SURGERY Left 1990's   "result of trauma"   . FACIAL FRACTURE SURGERY Left 1990's   "result of trauma"   . FRACTURE SURGERY    . HERNIA REPAIR    . LEFT HEART CATHETERIZATION WITH CORONARY ANGIOGRAM Right 03/07/2013   Procedure: LEFT HEART CATHETERIZATION WITH CORONARY ANGIOGRAM;  Surgeon: [name], MD;  Location: [place] CATH LAB;  Service: Cardiovascular;  Laterality: Right;  . UMBILICAL HERNIA REPAIR          Home Medications    Prior to Admission medications   Medication Sig Start Date End Date Taking? Authorizing Provider  acetaminophen (TYLENOL) 500 MG tablet Take 1,000 mg by mouth every 6 (six) hours as needed for mild pain.    [provider]  amitriptyline (ELAVIL) 25 MG tablet Take 1 tablet (25 mg total) by mouth at bedtime. 10/15/18   [name], MD  Cyanocobalamin (VITAMIN B-12 PO) Take 1 tablet by mouth daily.    [provider]  folic acid (FOLVITE) 1 MG tablet Take 1 tablet (1 mg total) by mouth daily. 11/26/17   [name], MD  gabapentin (NEURONTIN) 100 MG capsule Take 1 capsule (100 mg total) by mouth 2 (two) times daily. 10/15/18   [name], MD  hydrALAZINE (APRESOLINE) 25 MG tablet Take 25 mg by mouth daily.    [provider]  lipase/protease/amylase (CREON) 12000 units CPEP capsule Take 1 capsule (12,000 Units total) by mouth 3 (three) times daily with meals. For pancreatitis 10/15/18   [name], MD  loratadine (CLARITIN) 10 MG tablet Take 1 tablet (10 mg total) by mouth daily as needed for allergies or rhinitis. (May purchase from over the counter): For allergies Patient taking differently: Take 10 mg by mouth daily. (May purchase from over the counter): For allergies 10/01/17   Aline August, MD   LORazepam (ATIVAN) 0.5 MG tablet Take 0.5 mg by mouth every 8 (eight) hours as needed for anxiety.    [provider]  metoprolol tartrate (LOPRESSOR) 100 MG tablet Take 100 mg by mouth 2 (two) times daily.     [provider]  Multiple Vitamin (MULTIVITAMIN WITH MINERALS) TABS tablet Take 1 tablet by mouth daily. 09/06/17   Bonnell Public, MD  nicotine (NICODERM CQ - DOSED IN MG/24 HOURS) 21 mg/24hr patch Place 1 patch (21 mg total) onto the skin daily. 10/16/18   [name], MD  nicotine polacrilex (NICORETTE) 4 MG gum Take 4 mg by mouth daily as needed for smoking cessation.    [provider]  ondansetron (ZOFRAN) 4 MG tablet Take 1 tablet (4 mg total) by mouth every 8 (eight) hours as needed for nausea or vomiting. 11/24/18   Nuala Alpha A, PA-C  oxyCODONE-acetaminophen (PERCOCET/ROXICET) 5-325 MG tablet Take 1 tablet by mouth every 8 (eight) hours as needed for severe pain.    [provider]  QUEtiapine (SEROQUEL) 100 MG tablet Take 100 mg by mouth at bedtime.    [provider]  sertraline (ZOLOFT) 100 MG tablet Take 1 tablet (100 mg total) by mouth daily. Patient taking differently: Take 150 mg by mouth daily.  08/29/17   Clent Demark, PA-C  sucralfate (CARAFATE) 1 g tablet Take 1 tablet (1 g total) by mouth 4 (four) times daily -  with meals and at bedtime. 11/24/18   Nuala Alpha A, PA-C  thiamine 100 MG tablet Take 1 tablet (100 mg total) by mouth daily. 10/16/18   [name], MD    Family History Family History  Problem Relation Age of Onset  . Hypertension Mother   . Cirrhosis Mother   . Alcoholism Mother   . Hypertension Father   . Melanoma Father   . Hypertension Other   . Coronary artery disease Other     Social History Social History   Tobacco Use  . Smoking status: Current Every Day Smoker    Packs/day: 1.00    Years: 33.00    Pack years: 33.00    Types: Cigarettes, E-cigarettes  . Smokeless  tobacco: Never Used  Substance Use Topics  . Alcohol use: Yes  . Drug use: Yes    Types: Marijuana, Cocaine    Comment: daily marijuana use; last cocaine use about 3 months ago     Allergies   Robaxin [methocarbamol], Shellfish-derived products, Trazodone, Trazodone and nefazodone, Adhesive [tape], Latex, Toradol [ketorolac tromethamine], Contrast media [iodinated diagnostic agents], and Reglan [metoclopramide]   Review of Systems Review of Systems  All other systems reviewed and are negative.    Physical Exam Updated Vital Signs BP 138/90   Pulse (!) 109   Temp 98.1 F (36.7 C) (Oral)   Resp 20   SpO2 99%   Physical Exam Vitals signs and nursing note reviewed.  Constitutional:      Appearance: He is well-developed.  HENT:     Head: Normocephalic and atraumatic.  Eyes:     Conjunctiva/sclera: Conjunctivae normal.  Neck:     Musculoskeletal: Neck supple.  Cardiovascular:     Rate and Rhythm: Regular rhythm.     Heart sounds: No murmur.     Comments: Tachycardic Pulmonary:     Effort: Pulmonary effort is normal. No respiratory distress.     Breath sounds: Normal breath sounds.  Abdominal:     Palpations: Abdomen is soft.     Tenderness: There is no abdominal tenderness.  Skin:    General: Skin is warm and dry.  Neurological:     Mental Status: He is alert and oriented to person, place, and time.  Psychiatric:        Mood and Affect: Mood normal.        Behavior: Behavior normal.      ED Treatments / Results  Labs (all labs ordered are listed, but only abnormal results are displayed) Labs Reviewed  BASIC METABOLIC PANEL - Abnormal; Notable for the following components:      Result Value   Sodium 133 (*)    CO2 19 (*)    Glucose, Bld 110 (*)    All other components within normal limits  CBC - Abnormal; Notable for the following components:   RBC 3.58 (*)    Hemoglobin 11.4 (*)    HCT 34.2 (*)    RDW 16.9 (*)    All other components within normal  limits  LIPASE, BLOOD  TROPONIN I (HIGH SENSITIVITY)  EKG None  Radiology Dg Chest 2 View  Result Date: 12/25/2018 CLINICAL DATA:  Chest pain EXAM: CHEST - 2 VIEW COMPARISON:  November 24, 2018 FINDINGS: The heart size is stable. There is no pneumothorax. No large pleural effusion. There are old healed right-sided rib fractures. There is a new pulmonary nodule overlying the left lower the left mid lung zone, best visualized on the frontal view. This is new since November 17, 2018. the lungs remain somewhat hyperexpanded. IMPRESSION: 1. No definite acute cardiopulmonary process. 2. New small left-sided pulmonary opacities detailed above. Given its short interval development, this is favored to represent atelectasis or small infiltrate. Follow-up to radiologic resolution is recommended. Electronically Signed   By: Constance Holster M.D.   On: 12/25/2018 20:09    Procedures Procedures (including critical care time)  Medications Ordered in ED Medications  sodium chloride flush (NS) 0.9 % injection 3 mL (has no administration in time range)  LORazepam (ATIVAN) tablet 1 mg (has no administration in time range)  sodium chloride 0.9 % bolus 1,000 mL (has no administration in time range)  alum & mag hydroxide-simeth (MAALOX/MYLANTA) 200-200-20 MG/5ML suspension 30 mL (has no administration in time range)    And  lidocaine (XYLOCAINE) 2 % viscous mouth solution 15 mL (has no administration in time range)  ketorolac (TORADOL) 15 MG/ML injection 15 mg (has no administration in time range)     Initial Impression / Assessment and Plan / ED Course  I have reviewed the triage vital signs and the nursing notes.  Pertinent labs & imaging results that were available during my care of the patient were reviewed by me and considered in my medical decision making (see chart for details).        Patient well-known to the emergency department.  Here with acute exacerbation of chronic conditions.  Noted to be  tachycardic to the 120s.  Last alcohol use was 3 days ago.  No seizure-like activity.  Blood pressures have been fairly normal.  Doubt acute withdrawal.  However, I will give him 1 dose of Ativan and a little fluid to see if his heart rate improves.  He asks for a refill of his Carafate.  Patient symptoms improved.  Heart rate improved.  Ambulates out of the emergency department without any indication of being in pain.  Final Clinical Impressions(s) / ED Diagnoses   Final diagnoses:  Chronic abdominal pain    ED Discharge Orders    None       Montine Circle, PA-C 12/26/18 0305    Ripley Fraise, MD 12/26/18 [phone]

## 2018-12-27 IMAGING — US US ABDOMEN COMPLETE
1 series · 14 of 25 positions shown · non-contrast
Comparison: 01/15/2017

CLINICAL DATA: Acute abdominal pain.  Pancreatitis

EXAM:
ABDOMEN ULTRASOUND COMPLETE

[Series 1: us abdomen complete · 0.17mm/px · 14 of 113 slices shown]
[im 1/113]
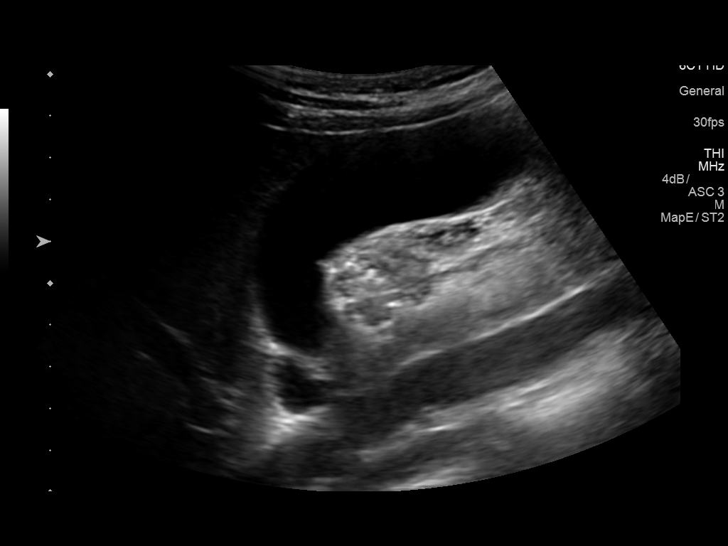
[im 10/113]
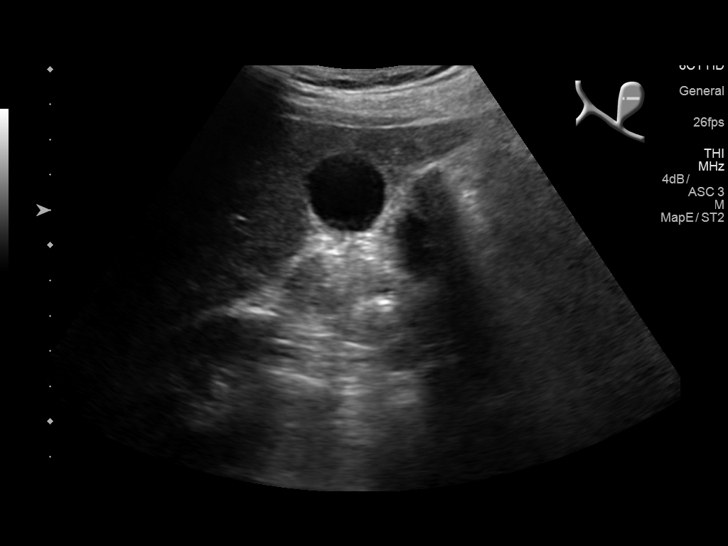
[im 19/113]
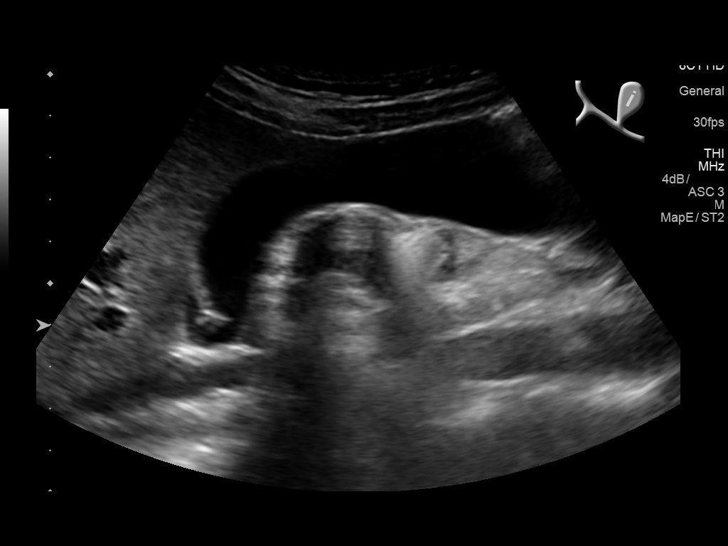
[im 29/113]
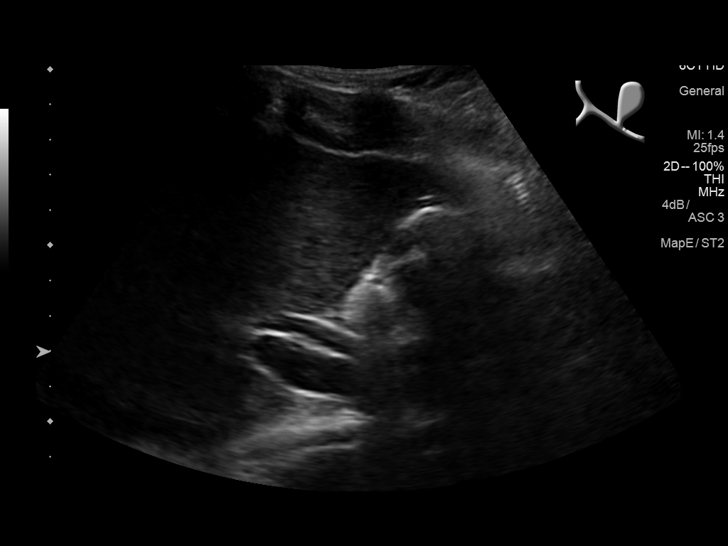
[im 38/113]
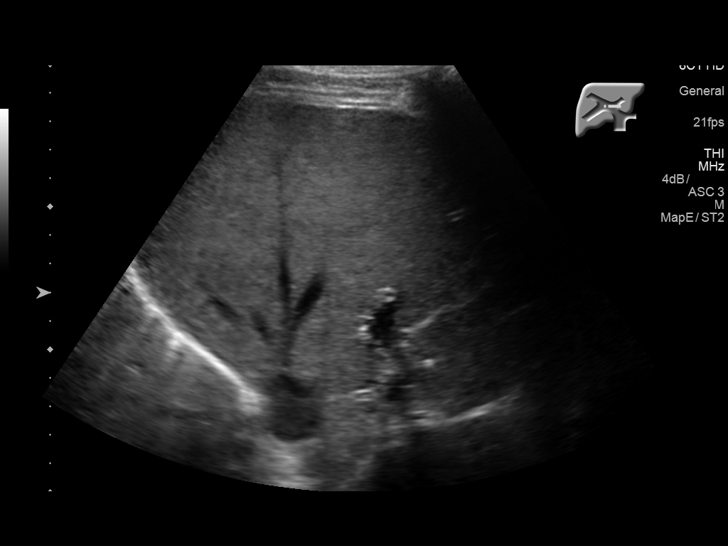
[im 43/113]
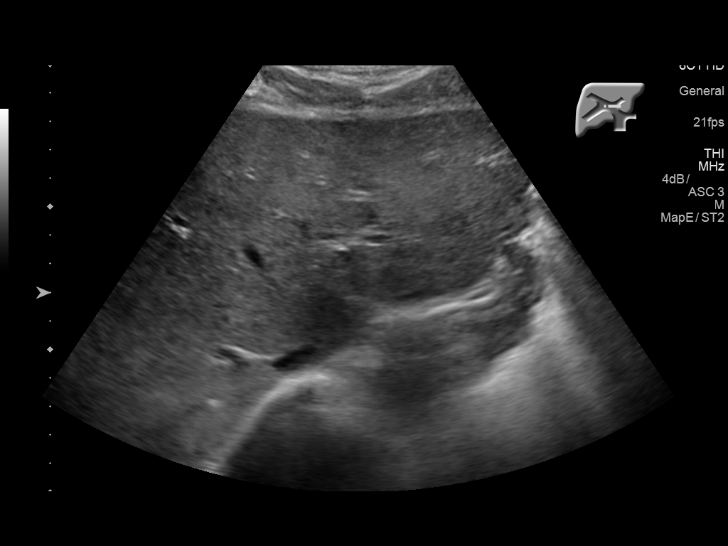
[im 52/113]
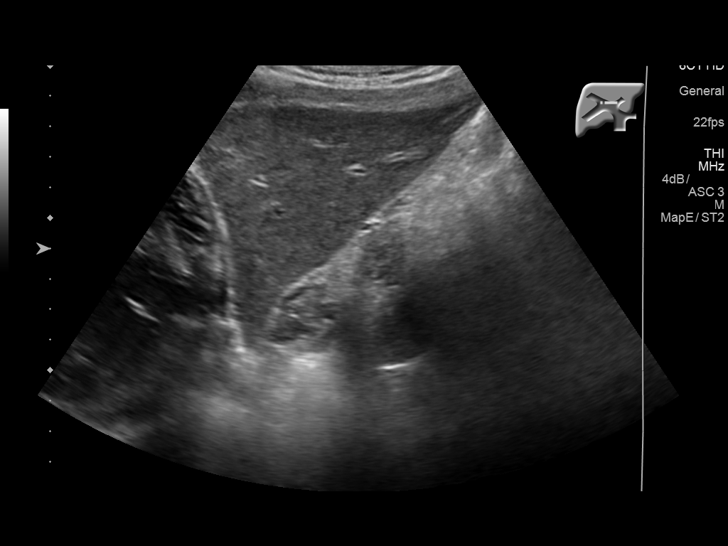
[im 61/113]
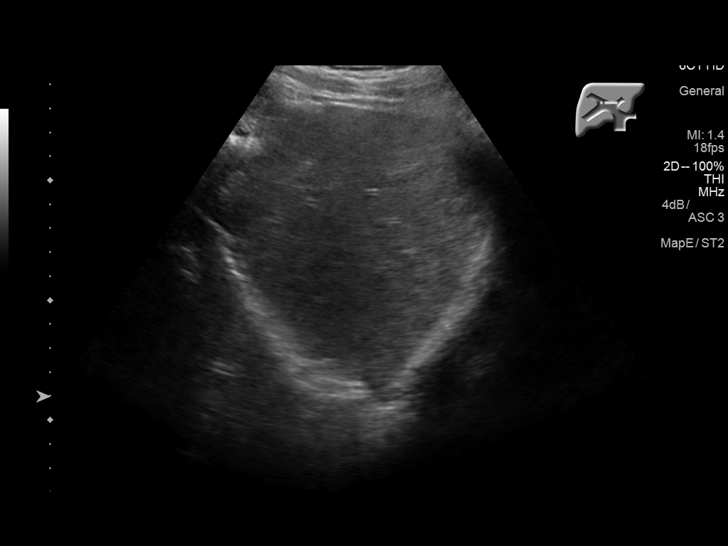
[im 71/113]
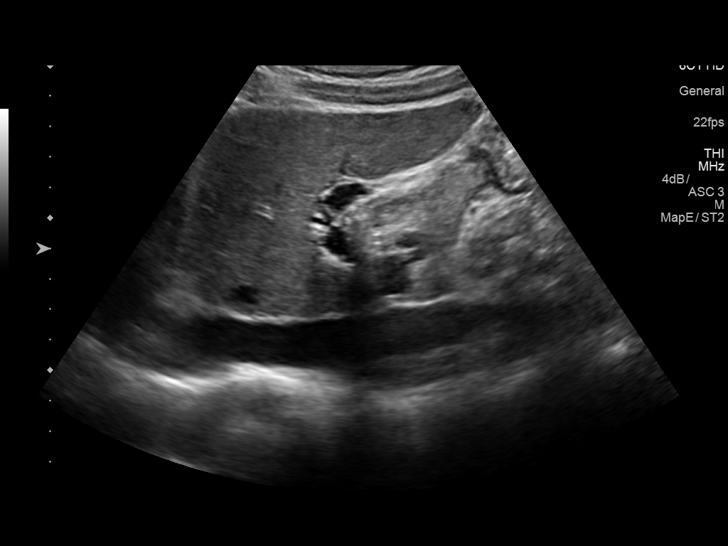
[im 75/113]
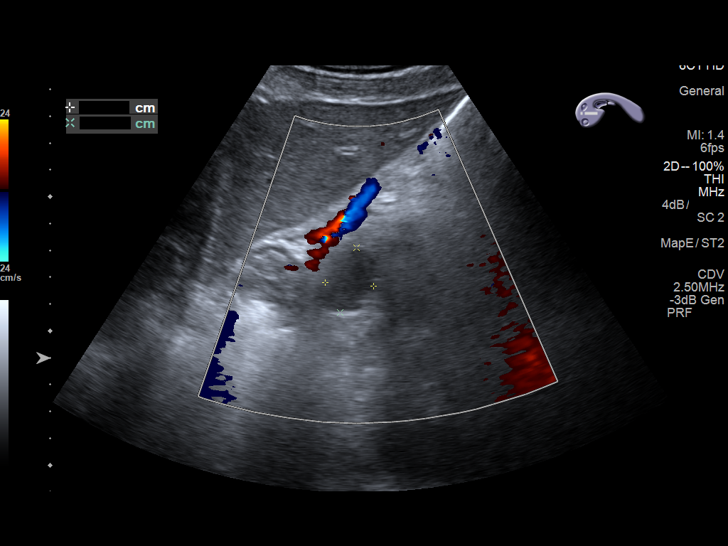
[im 85/113]
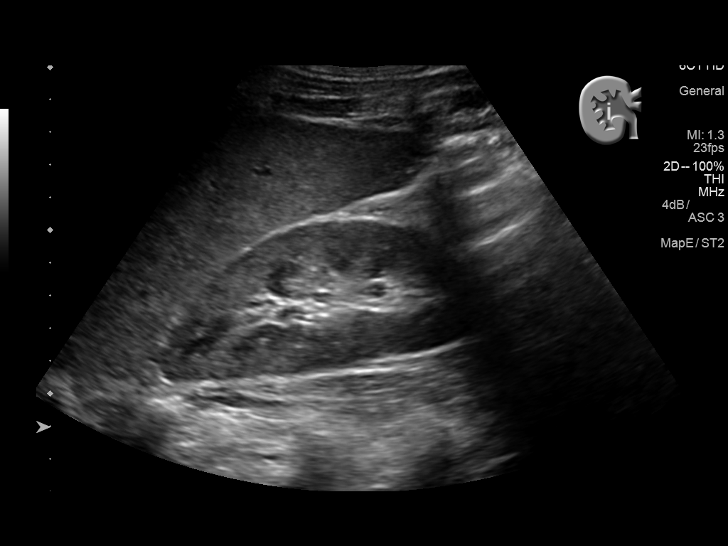
[im 94/113]
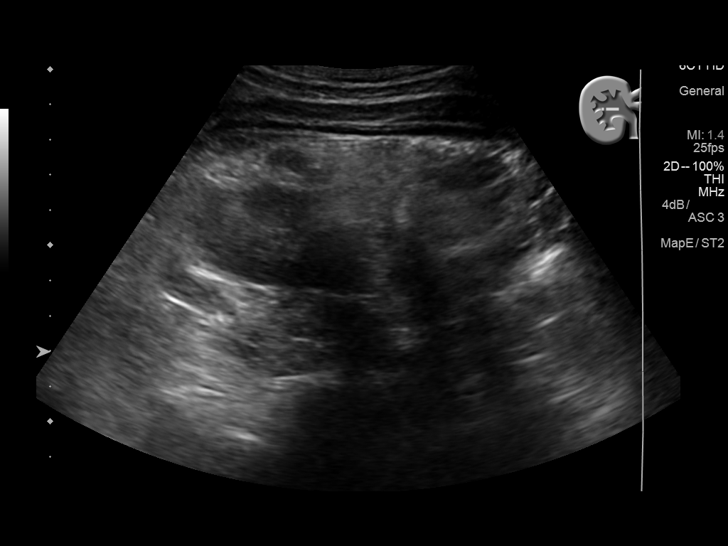
[im 103/113]
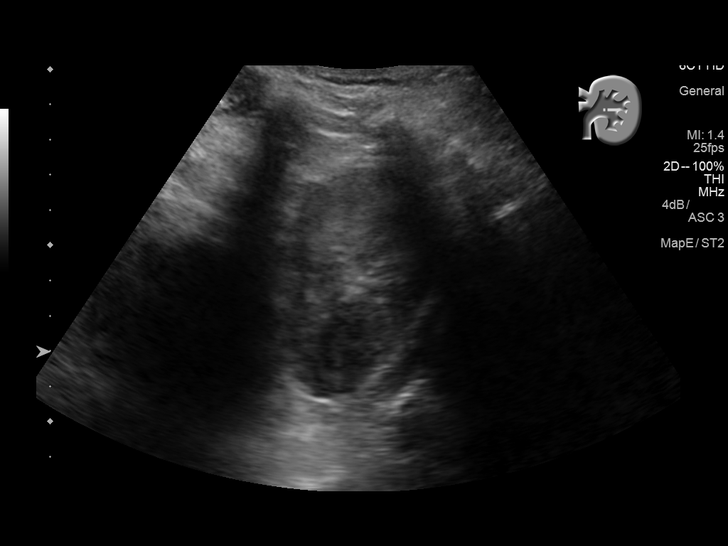
[im 113/113]
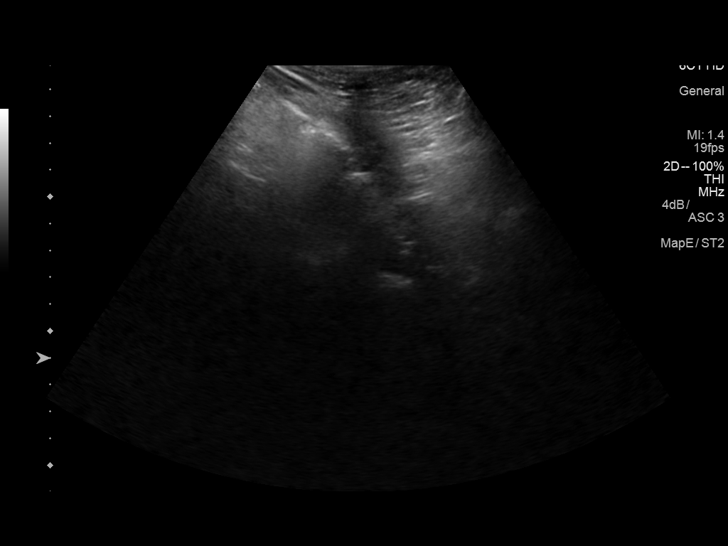

[14 of 25 positions shown; findings below may reference images not displayed]

FINDINGS: Gallbladder: No gallstones or wall thickening visualized. No
sonographic Murphy sign noted by sonographer.

Common bile duct: Diameter: 5.8 mm

Liver: No focal lesion identified. Within normal limits in
parenchymal echogenicity. Portal vein is patent on color Doppler
imaging with normal direction of blood flow towards the liver.

IVC: No abnormality visualized.

Pancreas: Hypoechoic structure within the pancreas measures 1.8 x
2.5 x 3.1 cm. This corresponds to the cystic appearing lesion noted
on CT from 01/15/2017. Favored to represent chronic pseudocyst.

Spleen: Size and appearance within normal limits.

Right Kidney: Length: 11.2 cm. Echogenicity within normal limits. No
mass or hydronephrosis visualized.

Left Kidney: Length: 12.4 cm. Echogenicity within normal limits. No
mass or hydronephrosis visualized.

Abdominal aorta: No aneurysm visualized.

Other findings: None.
IMPRESSION: 1. Hypoechoic structure within the pancreas likely chronic
pseudocyst secondary to pancreatitis.
2. No gallstones identified.

## 2018-12-31 ENCOUNTER — Emergency Department (HOSPITAL_COMMUNITY)
Admission: EM | Admit: 2018-12-31 | Discharge: 2019-01-01 | Disposition: A | Discharge status: Home / Self Care | Payer: Self-pay | Attending: Emergency Medicine | Admitting: Emergency Medicine

## 2018-12-31 ENCOUNTER — Encounter (HOSPITAL_COMMUNITY): Payer: Self-pay | Admitting: Emergency Medicine

## 2018-12-31 ENCOUNTER — Other Ambulatory Visit: Payer: Self-pay

## 2018-12-31 DIAGNOSIS — D573 Sickle-cell trait: Secondary | ICD-10-CM | POA: Insufficient documentation

## 2018-12-31 DIAGNOSIS — F17210 Nicotine dependence, cigarettes, uncomplicated: Secondary | ICD-10-CM | POA: Insufficient documentation

## 2018-12-31 DIAGNOSIS — G8929 Other chronic pain: Secondary | ICD-10-CM | POA: Insufficient documentation

## 2018-12-31 DIAGNOSIS — Z79899 Other long term (current) drug therapy: Secondary | ICD-10-CM | POA: Insufficient documentation

## 2018-12-31 DIAGNOSIS — K869 Disease of pancreas, unspecified: Secondary | ICD-10-CM | POA: Insufficient documentation

## 2018-12-31 DIAGNOSIS — F319 Bipolar disorder, unspecified: Secondary | ICD-10-CM | POA: Insufficient documentation

## 2018-12-31 DIAGNOSIS — F1290 Cannabis use, unspecified, uncomplicated: Secondary | ICD-10-CM | POA: Insufficient documentation

## 2018-12-31 DIAGNOSIS — I10 Essential (primary) hypertension: Secondary | ICD-10-CM | POA: Insufficient documentation

## 2018-12-31 DIAGNOSIS — R1013 Epigastric pain: Secondary | ICD-10-CM | POA: Insufficient documentation

## 2018-12-31 DIAGNOSIS — F1010 Alcohol abuse, uncomplicated: Secondary | ICD-10-CM | POA: Insufficient documentation

## 2018-12-31 DIAGNOSIS — Z91040 Latex allergy status: Secondary | ICD-10-CM | POA: Insufficient documentation

## 2018-12-31 DIAGNOSIS — Z8709 Personal history of other diseases of the respiratory system: Secondary | ICD-10-CM | POA: Insufficient documentation

## 2018-12-31 LAB — URINALYSIS, ROUTINE W REFLEX MICROSCOPIC
Bilirubin Urine: NEGATIVE
Glucose, UA: NEGATIVE mg/dL
Hgb urine dipstick: NEGATIVE
Ketones, ur: NEGATIVE mg/dL
Leukocytes,Ua: NEGATIVE
Nitrite: NEGATIVE
Protein, ur: NEGATIVE mg/dL
Specific Gravity, Urine: 1.006 (ref 1.005–1.030)
pH: 5 (ref 5.0–8.0)

## 2018-12-31 LAB — CBC
HCT: 31.8 % — ABNORMAL LOW (ref 39.0–52.0)
Hemoglobin: 10.5 g/dL — ABNORMAL LOW (ref 13.0–17.0)
MCH: 31.6 pg (ref 26.0–34.0)
MCHC: 33 g/dL (ref 30.0–36.0)
MCV: 95.8 fL (ref 80.0–100.0)
Platelets: 288 10*3/uL (ref 150–400)
RBC: 3.32 MIL/uL — ABNORMAL LOW (ref 4.22–5.81)
RDW: 16.7 % — ABNORMAL HIGH (ref 11.5–15.5)
WBC: 10.1 10*3/uL (ref 4.0–10.5)
nRBC: 0 % (ref 0.0–0.2)

## 2018-12-31 LAB — COMPREHENSIVE METABOLIC PANEL WITH GFR
ALT: 18 U/L (ref 0–44)
AST: 26 U/L (ref 15–41)
Albumin: 3.4 g/dL — ABNORMAL LOW (ref 3.5–5.0)
Alkaline Phosphatase: 84 U/L (ref 38–126)
Anion gap: 11 (ref 5–15)
BUN: <5 mg/dL — ABNORMAL LOW (ref 6–20)
CO2: 21 mmol/L — ABNORMAL LOW (ref 22–32)
Calcium: 8.4 mg/dL — ABNORMAL LOW (ref 8.9–10.3)
Chloride: 103 mmol/L (ref 98–111)
Creatinine, Ser: 0.58 mg/dL — ABNORMAL LOW (ref 0.61–1.24)
GFR calc Af Amer: >60 mL/min
GFR calc non Af Amer: >60 mL/min
Glucose, Bld: 152 mg/dL — ABNORMAL HIGH (ref 70–99)
Potassium: 3.3 mmol/L — ABNORMAL LOW (ref 3.5–5.1)
Sodium: 135 mmol/L (ref 135–145)
Total Bilirubin: 0.6 mg/dL (ref 0.3–1.2)
Total Protein: 6.8 g/dL (ref 6.5–8.1)

## 2018-12-31 LAB — LIPASE, BLOOD: Lipase: 26 U/L (ref 11–51)

## 2018-12-31 LAB — TROPONIN I (HIGH SENSITIVITY): Troponin I (High Sensitivity): 6 ng/L

## 2018-12-31 MED ORDER — SODIUM CHLORIDE 0.9% FLUSH: 3.0000 mL | Freq: Once | INTRAVENOUS | Status: DC

## 2018-12-31 NOTE — ED Triage Notes (Addendum)
Pt arrives via [facility] for c/o chest pain and abd pain, reports he also has pancreatitis. Pt refused asa and nitro with ems. vss en route. Pt seen here for same a few days ago, states he was started on abx for "a spot on his lung" but today chest pain became worse and he had some sob which he used his inhaler for without relief. Had neg covid test done at last visit here in the ED.

## 2019-01-01 MED ORDER — LIDOCAINE VISCOUS HCL 2 % MT SOLN
15.0000 mL | Freq: Once | OROMUCOSAL | Status: AC
  Administered 2019-01-01: 15 mL via ORAL
  Filled 2019-01-01: qty 15

## 2019-01-01 MED ORDER — ALUM & MAG HYDROXIDE-SIMETH 200-200-20 MG/5ML PO SUSP
30.0000 mL | Freq: Once | ORAL | Status: AC
  Administered 2019-01-01: 06:00:00 30 mL via ORAL
  Filled 2019-01-01: qty 30

## 2019-01-01 MED ORDER — KETOROLAC TROMETHAMINE 30 MG/ML IJ SOLN
30.0000 mg | Freq: Once | INTRAMUSCULAR | Status: AC
  Administered 2019-01-01: 30 mg via INTRAVENOUS
  Filled 2019-01-01: qty 1

## 2019-01-01 MED ORDER — LORAZEPAM 2 MG/ML IJ SOLN
1.0000 mg | Freq: Once | INTRAMUSCULAR | Status: AC
  Administered 2019-01-01: 1 mg via INTRAVENOUS
  Filled 2019-01-01: qty 1

## 2019-01-01 NOTE — ED Notes (Signed)
Patient verbalized understanding of discharge instructions. Opportunity for questions were provided. Pt. ambulatory and discharged home.  

## 2019-01-01 NOTE — ED Provider Notes (Signed)
[facility] EMERGENCY DEPARTMENT Provider Note   CSN: [id number] Arrival date & time: 12/31/18  1951     History   Chief Complaint Chief Complaint  Patient presents with  . Abdominal Pain    HPI [name] is a 48 y.o. male.     Patient presents to the emergency department with a chief complaint of epigastric abdominal pain.  He reports history of pancreatitis and stomach ulcers.  He has been taking his Carafate, omeprazole, and Creon as directed.  He denies any fever, chills, nausea, or vomiting.  He states that he did drink some alcohol a day or so ago.  He was seen recently for the same.  The history is provided by the patient. No language interpreter was used.    Past Medical History:  Diagnosis Date  . Alcoholism /alcohol abuse ([place])   . Anemia   . Anxiety   . Arthritis    "knees; arms; elbows" (03/26/2015)  . Asthma   . Bipolar disorder ([place])   . Chronic bronchitis ([place])   . Chronic lower back pain   . Chronic pancreatitis ([place])   . Cocaine abuse ([place])   . Depression   . Family history of adverse reaction to anesthesia    "grandmother gets confused"  . Femoral condyle fracture ([place]) 03/08/2014   left medial/notes 03/09/2014  . GERD (gastroesophageal reflux disease)   . H/O hiatal hernia   . H/O suicide attempt 10/2012  . High cholesterol   . History of blood transfusion 10/2012   "when I tried to commit suicide"  . History of stomach ulcers   . Hypertension   . Marijuana abuse, continuous   . Migraine    "a few times/year" (03/26/2015)  . Pneumonia 1990's X 3  . PTSD (post-traumatic stress disorder)   . Sickle cell trait ([place])   . WPW (Wolff-Parkinson-White syndrome)    [name] 03/06/2013    Patient Active Problem List   Diagnosis Date Noted  . AKI (acute kidney injury) ([place]) 11/13/2018  . Seizure ([place]) 11/13/2018  . Gastritis and gastroduodenitis   . Chest pain 01/08/2018  . GI bleed 11/24/2017  . Acute blood loss  anemia 11/24/2017  . Atypical chest pain 11/24/2017  . Acute pancreatitis 09/28/2017  . Abdominal pain 05/27/2017  . Hematemesis 05/27/2017  . Tachycardia 03/18/2017  . Diarrhea 03/18/2017  . Acute on chronic pancreatitis ([place]) 12/17/2016  . Intractable nausea and vomiting 12/05/2016  . Verbally abusive behavior 12/05/2016  . Normocytic anemia 12/05/2016  . Alcohol use disorder, severe, dependence ([place]) 07/25/2016  . Cocaine use disorder, severe, dependence ([place]) 07/25/2016  . 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 Leukocytosis   . Hospital acquired PNA 05/20/2015  . Chronic pancreatitis ([place]) 05/18/2015  . Pseudocyst of pancreas 05/18/2015  . Polysubstance abuse (tobacco, cocaine, THC, and ETOH) 03/26/2015  . Alcohol-induced chronic pancreatitis ([place])   . Benign essential HTN 02/06/2014  . Alcohol-induced acute pancreatitis 11/28/2013  . Pancreatic pseudocyst/cyst 11/25/2013  . Severe protein-calorie malnutrition ([place]) 10/10/2013  . Suicide attempt ([place]) 10/08/2013  . 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 TOBACCO ABUSE 03/23/2007    Past Surgical History:  Procedure Laterality Date  . BIOPSY  11/25/2017   Procedure: BIOPSY;  Surgeon: Arta Silence, MD;  Location: [facility] ENDOSCOPY;  Service: Endoscopy;;  . BIOPSY  10/14/2018   Procedure: BIOPSY;  Surgeon: Arta Silence, MD;  Location: [facility] ENDOSCOPY;  Service: Endoscopy;;  .  CARDIAC CATHETERIZATION    . ESOPHAGOGASTRODUODENOSCOPY (EGD) WITH PROPOFOL N/A 11/25/2017   Procedure: ESOPHAGOGASTRODUODENOSCOPY (EGD) WITH PROPOFOL;  Surgeon: Arta Silence, MD;  Location: [facility];  Service: Endoscopy;  Laterality: N/A;  . ESOPHAGOGASTRODUODENOSCOPY (EGD) WITH PROPOFOL Left 10/14/2018   Procedure: ESOPHAGOGASTRODUODENOSCOPY (EGD) WITH PROPOFOL;  Surgeon: Arta Silence, MD;  Location: [facility] ENDOSCOPY;  Service: Endoscopy;  Laterality: Left;  .  ESOPHAGOGASTRODUODENOSCOPY (EGD) WITH PROPOFOL N/A 11/14/2018   Procedure: ESOPHAGOGASTRODUODENOSCOPY (EGD) WITH PROPOFOL;  Surgeon: Laurence Spates, MD;  Location: [facility] ENDOSCOPY;  Service: Gastroenterology;  Laterality: N/A;  . EYE SURGERY Left 1990's   "result of trauma"   . FACIAL FRACTURE SURGERY Left 1990's   "result of trauma"   . FRACTURE SURGERY    . HERNIA REPAIR    . LEFT HEART CATHETERIZATION WITH CORONARY ANGIOGRAM Right 03/07/2013   Procedure: LEFT HEART CATHETERIZATION WITH CORONARY ANGIOGRAM;  Surgeon: [name], MD;  Location: [facility] CATH LAB;  Service: Cardiovascular;  Laterality: Right;  . UMBILICAL HERNIA REPAIR          Home Medications    Prior to Admission medications   Medication Sig Start Date End Date Taking? Authorizing Provider  acetaminophen (TYLENOL) 500 MG tablet Take 1,000 mg by mouth every 6 (six) hours as needed for mild pain.    [provider]  amitriptyline (ELAVIL) 25 MG tablet Take 1 tablet (25 mg total) by mouth at bedtime. 10/15/18   [name], MD  azithromycin (ZITHROMAX) 250 MG tablet Take 1 tablet (250 mg total) by mouth daily. Take first 2 tablets together, then 1 every day until finished. 12/26/18   Montine Circle, PA-C  Cyanocobalamin (VITAMIN B-12 PO) Take 1 tablet by mouth daily.    [provider]  folic acid (FOLVITE) 1 MG tablet Take 1 tablet (1 mg total) by mouth daily. 11/26/17   [name], MD  gabapentin (NEURONTIN) 100 MG capsule Take 1 capsule (100 mg total) by mouth 2 (two) times daily. 10/15/18   [name], MD  hydrALAZINE (APRESOLINE) 25 MG tablet Take 25 mg by mouth daily.    [provider]  lipase/protease/amylase (CREON) 12000 units CPEP capsule Take 1 capsule (12,000 Units total) by mouth 3 (three) times daily with meals. For pancreatitis 10/15/18   [name], MD  loratadine (CLARITIN) 10 MG tablet Take 1 tablet (10 mg total) by mouth daily as needed for allergies or rhinitis. (May  purchase from over the counter): For allergies Patient taking differently: Take 10 mg by mouth daily. (May purchase from over the counter): For allergies 10/01/17   Aline August, MD  LORazepam (ATIVAN) 0.5 MG tablet Take 0.5 mg by mouth every 8 (eight) hours as needed for anxiety.    [provider]  metoprolol tartrate (LOPRESSOR) 100 MG tablet Take 100 mg by mouth 2 (two) times daily.     [provider]  Multiple Vitamin (MULTIVITAMIN WITH MINERALS) TABS tablet Take 1 tablet by mouth daily. 09/06/17   Bonnell Public, MD  nicotine (NICODERM CQ - DOSED IN MG/24 HOURS) 21 mg/24hr patch Place 1 patch (21 mg total) onto the skin daily. 10/16/18   [name], MD  nicotine polacrilex (NICORETTE) 4 MG gum Take 4 mg by mouth daily as needed for smoking cessation.    [provider]  ondansetron (ZOFRAN) 4 MG tablet Take 1 tablet (4 mg total) by mouth every 8 (eight) hours as needed for nausea or vomiting. 11/24/18   Nuala Alpha  A, PA-C  oxyCODONE-acetaminophen (PERCOCET/ROXICET) 5-325 MG tablet Take 1 tablet by mouth every 8 (eight) hours as needed for severe pain.    [provider]  QUEtiapine (SEROQUEL) 100 MG tablet Take 100 mg by mouth at bedtime.    [provider]  sertraline (ZOLOFT) 100 MG tablet Take 1 tablet (100 mg total) by mouth daily. Patient taking differently: Take 150 mg by mouth daily.  08/29/17   Clent Demark, PA-C  sucralfate (CARAFATE) 1 g tablet Take 1 tablet (1 g total) by mouth 4 (four) times daily -  with meals and at bedtime. 12/26/18   Montine Circle, PA-C  thiamine 100 MG tablet Take 1 tablet (100 mg total) by mouth daily. 10/16/18   [name], MD    Family History Family History  Problem Relation Age of Onset  . Hypertension Mother   . Cirrhosis Mother   . Alcoholism Mother   . Hypertension Father   . Melanoma Father   . Hypertension Other   . Coronary artery disease Other     Social History Social  History   Tobacco Use  . Smoking status: Current Every Day Smoker    Packs/day: 1.00    Years: 33.00    Pack years: 33.00    Types: Cigarettes, E-cigarettes  . Smokeless tobacco: Never Used  Substance Use Topics  . Alcohol use: Yes  . Drug use: Yes    Types: Marijuana, Cocaine    Comment: daily marijuana use; last cocaine use about 3 months ago     Allergies   Robaxin [methocarbamol], Shellfish-derived products, Trazodone, Trazodone and nefazodone, Adhesive [tape], Latex, Toradol [ketorolac tromethamine], Contrast media [iodinated diagnostic agents], and Reglan [metoclopramide]   Review of Systems Review of Systems  All other systems reviewed and are negative.    Physical Exam Updated Vital Signs BP 119/88 (BP Location: Right Arm)   Pulse 86   Temp 97.7 F (36.5 C) (Oral)   Resp 18   Ht 5\' 9"  (1.753 m)   Wt 59 kg   SpO2 100%   BMI 19.20 kg/m   Physical Exam Vitals signs and nursing note reviewed.  Constitutional:      Appearance: He is well-developed.  HENT:     Head: Normocephalic and atraumatic.  Eyes:     Conjunctiva/sclera: Conjunctivae normal.  Neck:     Musculoskeletal: Neck supple.  Cardiovascular:     Rate and Rhythm: Normal rate and regular rhythm.     Heart sounds: No murmur.  Pulmonary:     Effort: Pulmonary effort is normal. No respiratory distress.     Breath sounds: Normal breath sounds.  Abdominal:     Palpations: Abdomen is soft.     Tenderness: There is abdominal tenderness.     Comments: Epigastric tenderness  Skin:    General: Skin is warm and dry.  Neurological:     Mental Status: He is alert and oriented to person, place, and time.  Psychiatric:        Mood and Affect: Mood normal.        Behavior: Behavior normal.      ED Treatments / Results  Labs (all labs ordered are listed, but only abnormal results are displayed) Labs Reviewed  COMPREHENSIVE METABOLIC PANEL - Abnormal; Notable for the following components:       Result Value   Potassium 3.3 (*)    CO2 21 (*)    Glucose, Bld 152 (*)    BUN <5 (*)  Creatinine, Ser 0.58 (*)    Calcium 8.4 (*)    Albumin 3.4 (*)    All other components within normal limits  CBC - Abnormal; Notable for the following components:   RBC 3.32 (*)    Hemoglobin 10.5 (*)    HCT 31.8 (*)    RDW 16.7 (*)    All other components within normal limits  URINALYSIS, ROUTINE W REFLEX MICROSCOPIC - Abnormal; Notable for the following components:   Color, Urine STRAW (*)    All other components within normal limits  LIPASE, BLOOD  TROPONIN I (HIGH SENSITIVITY)    EKG None  Radiology No results found.  Procedures Procedures (including critical care time)  Medications Ordered in ED Medications  sodium chloride flush (NS) 0.9 % injection 3 mL (has no administration in time range)  ketorolac (TORADOL) 30 MG/ML injection 30 mg (has no administration in time range)  alum & mag hydroxide-simeth (MAALOX/MYLANTA) 200-200-20 MG/5ML suspension 30 mL (has no administration in time range)    And  lidocaine (XYLOCAINE) 2 % viscous mouth solution 15 mL (has no administration in time range)  LORazepam (ATIVAN) injection 1 mg (has no administration in time range)     Initial Impression / Assessment and Plan / ED Course  I have reviewed the triage vital signs and the nursing notes.  Pertinent labs & imaging results that were available during my care of the patient were reviewed by me and considered in my medical decision making (see chart for details).        Patient with chronic abdominal pain.  Here with acute exacerbation of his chronic symptoms.  Vital signs are stable.  Laboratory work-up is reassuring.  Patient given GI cocktail, Toradol IV, 1 dose of Ativan.  Discharge to home with PCP follow-up.  I saw this patient approximately 1 week ago, and contacted social work to assist him with getting into see gastroenterology.  I did receive an email back from the social  worker, who states that they are working on this.  I have instructed [name] to follow-up with Raechel Chute.  Final Clinical Impressions(s) / ED Diagnoses   Final diagnoses:  Other chronic pain    ED Discharge Orders    None       Montine Circle, PA-C 01/01/19 0549    Ezequiel Essex, MD 01/01/19 [phone]

## 2019-01-06 IMAGING — CT CT ABD-PELV W/ CM
2 of 5 series · 15 of 46 positions shown, 17 images · IV contrast (ISOVUE)
Comparison: 05/18/2015

CLINICAL DATA: Abdominal pain. Reason blunt trauma to the
epigastric region. The pain feels similar to previous pancreatitis
flares.

EXAM:
CT ABDOMEN AND PELVIS WITH CONTRAST
TECHNIQUE: Multidetector CT imaging of the abdomen and pelvis was performed
using the standard protocol following bolus administration of
intravenous contrast.
CONTRAST:  100mL J6PGIO-WAA IOPAMIDOL (J6PGIO-WAA) INJECTION 61%

[Series 2: abd/pel with · axial · 0.70mm/px · z∈[-461,-116]mm · 12 of 81 slices shown, 14 images]
[im 6/81  soft-tissue]
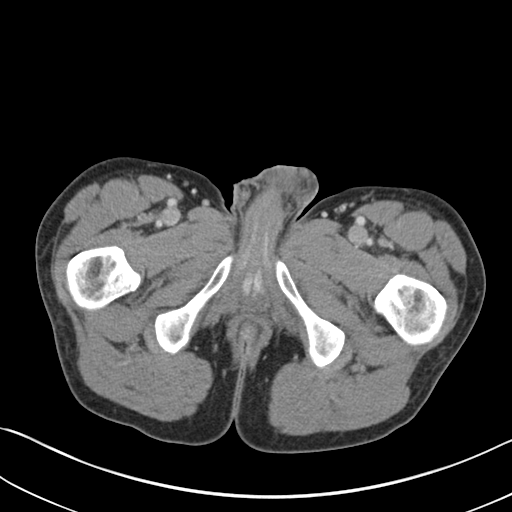
[im 6/81  bone]
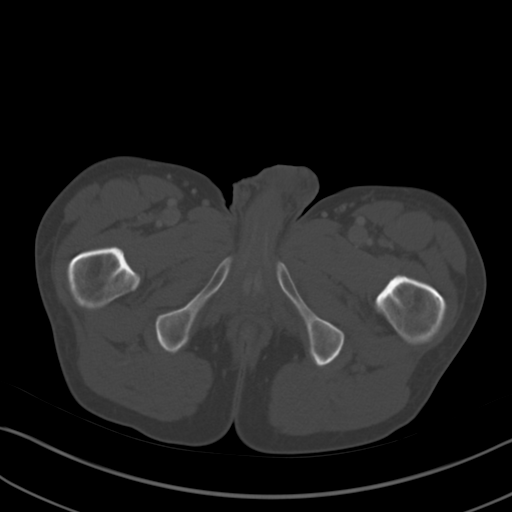
[im 12/81  soft-tissue]
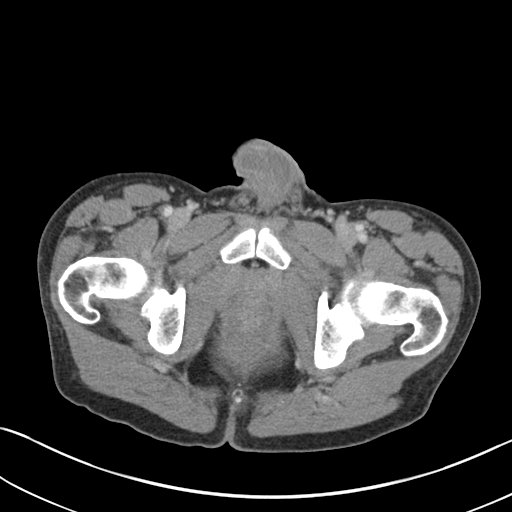
[im 18/81  soft-tissue]
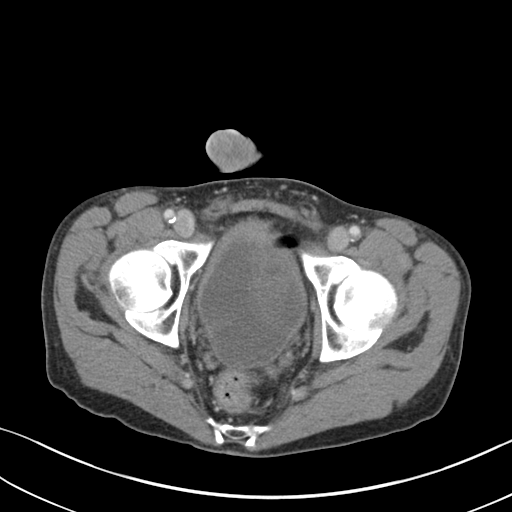
[im 23/81  soft-tissue]
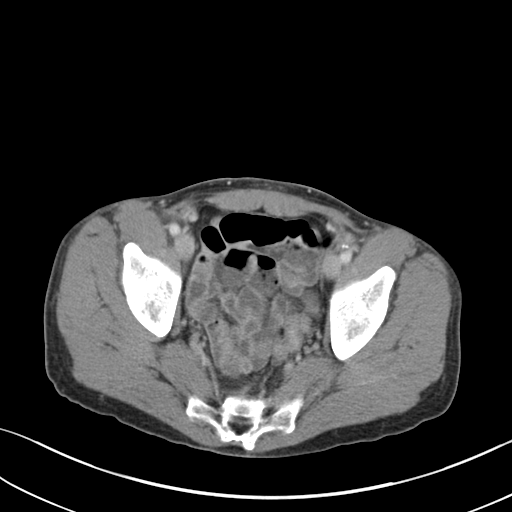
[im 29/81  soft-tissue]
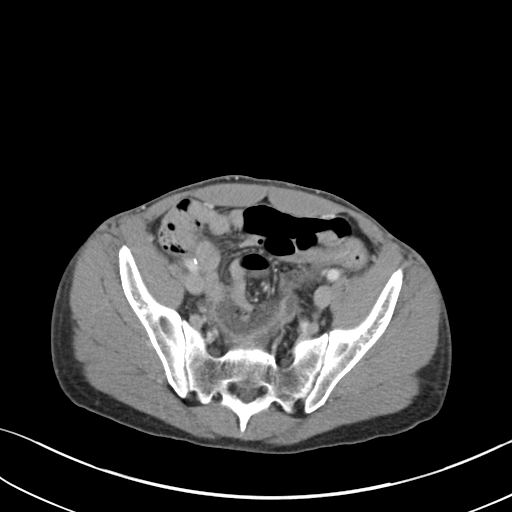
[im 35/81  soft-tissue]
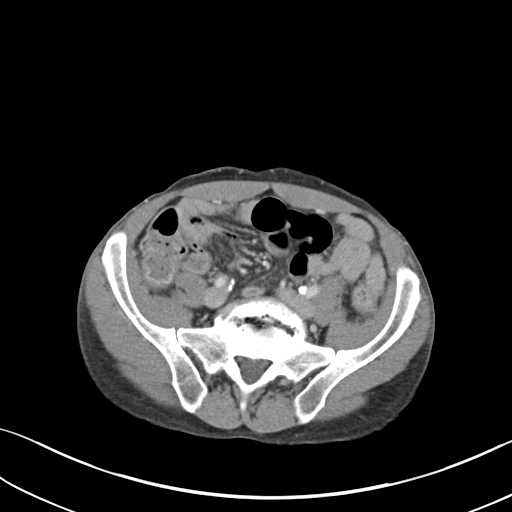
[im 46/81  soft-tissue]
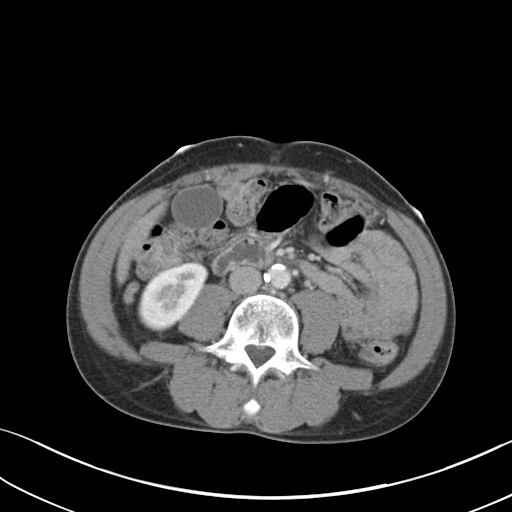
[im 52/81  soft-tissue]
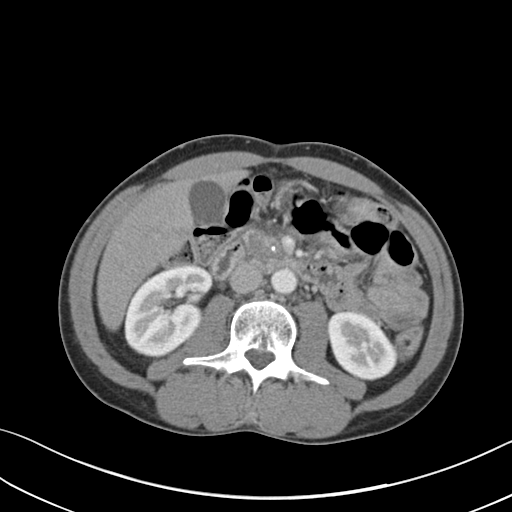
[im 58/81  soft-tissue]
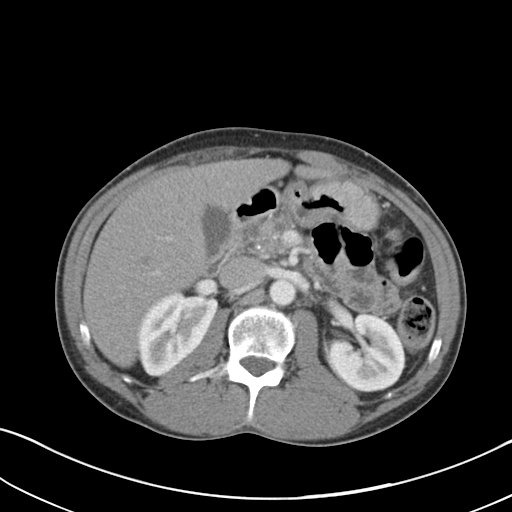
[im 58/81  bone]
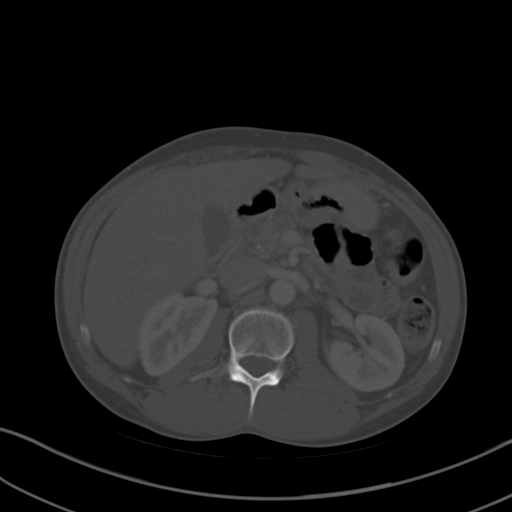
[im 63/81  soft-tissue]
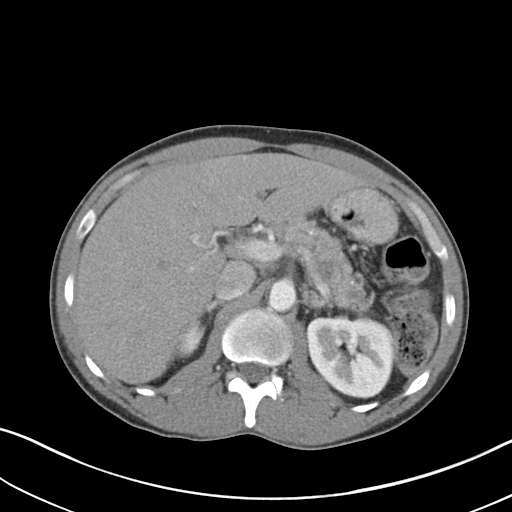
[im 69/81  soft-tissue]
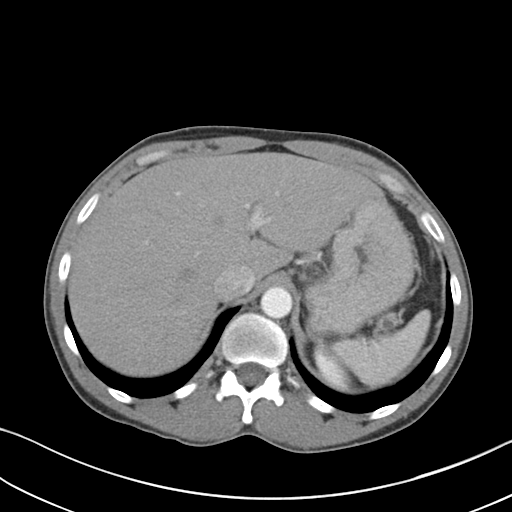
[im 75/81  soft-tissue]
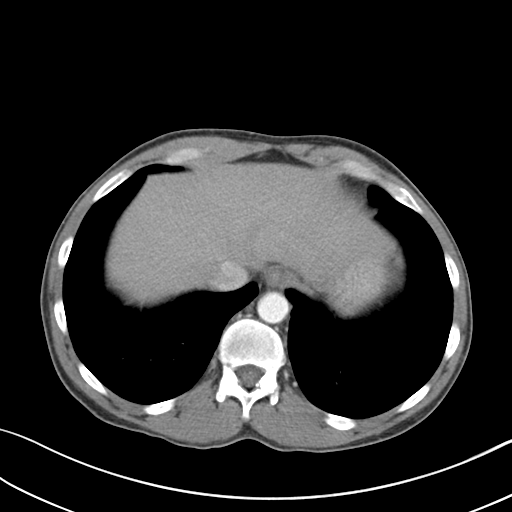

[Series 5: coronal a/|p · coronal · 0.60mm/px · 3 of 111 slices shown]
[im 37/111  soft-tissue]
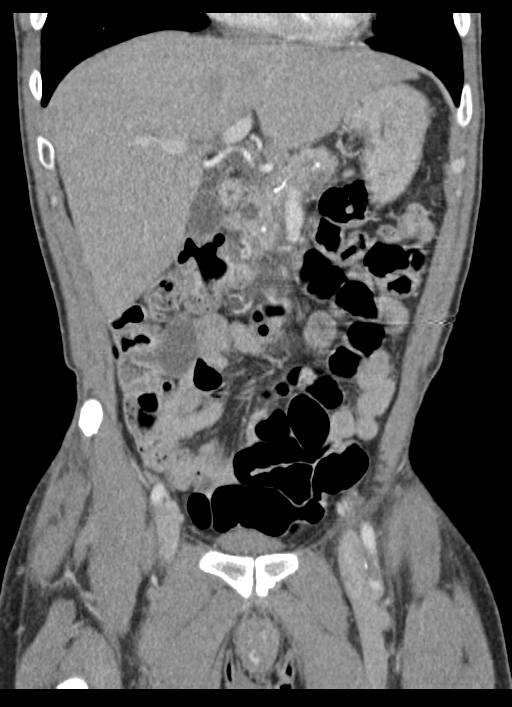
[im 49/111  soft-tissue]
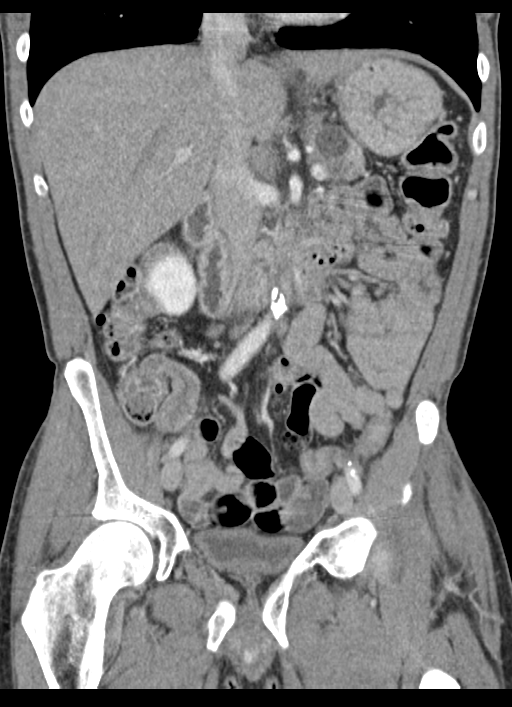
[im 62/111  soft-tissue]
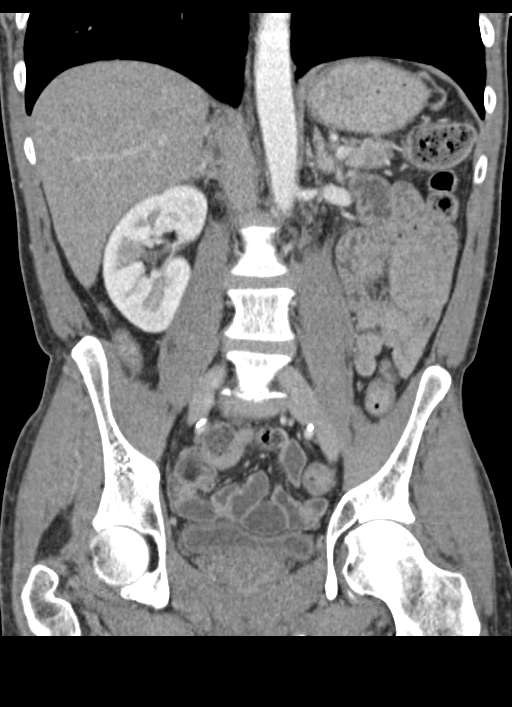

[15 of 46 positions shown; findings below may reference images not displayed]

FINDINGS: Lower chest: Normal

Hepatobiliary: No focal liver abnormality is seen. No gallstones,
gallbladder wall thickening, or biliary dilatation.

Pancreas: Calcifications typical of chronic pancreatitis. Multiple
pseudocysts, measuring from a few mm up to 2.1 cm. Variable interval
changes in these pseudo cysts. The largest is in the pancreatic body
to the left of midline, measuring 2.1 cm and previously measuring
0.9 cm. On the prior study the largest pseudocyst was in the
pancreatic head measuring 2.2 cm; this cyst now measures 1.2 cm.
There is mild hazy attenuation in the peripancreatic fat around the
head and uncinate portions. This might represent a degree of acute
pancreatitis superimposed on chronic but it is inconclusive.

Spleen: Normal in size without focal abnormality.

Adrenals/Urinary Tract: Adrenal glands are unremarkable. Kidneys are
normal, without renal calculi, focal lesion, or hydronephrosis.
Bladder is unremarkable.

Stomach/Bowel: Stomach is within normal limits. Appendix is normal.
No evidence of bowel wall thickening, distention, or inflammatory
changes.

Vascular/Lymphatic: The abdominal aorta is normal in caliber with
moderate atherosclerotic calcification. No pathologic adenopathy in
the abdomen or pelvis.

Reproductive: Unremarkable

Other: No ascites

Musculoskeletal: No significant skeletal lesion
IMPRESSION: 1. Chronic pancreatitis.
2. Multiple pseudocysts, the largest measuring 2.1 cm.
3. Equivocal mild acute pancreatitis around the pancreatic head.

## 2019-01-07 ENCOUNTER — Emergency Department (HOSPITAL_COMMUNITY): Payer: Self-pay

## 2019-01-07 ENCOUNTER — Encounter (HOSPITAL_COMMUNITY): Payer: Self-pay | Admitting: *Deleted

## 2019-01-07 ENCOUNTER — Other Ambulatory Visit: Payer: Self-pay

## 2019-01-07 ENCOUNTER — Emergency Department (HOSPITAL_COMMUNITY)
Admission: EM | Admit: 2019-01-07 | Discharge: 2019-01-07 | Disposition: A | Discharge status: Home / Self Care | Payer: Self-pay | Attending: Emergency Medicine | Admitting: Emergency Medicine

## 2019-01-07 DIAGNOSIS — Z79899 Other long term (current) drug therapy: Secondary | ICD-10-CM | POA: Insufficient documentation

## 2019-01-07 DIAGNOSIS — I456 Pre-excitation syndrome: Secondary | ICD-10-CM | POA: Insufficient documentation

## 2019-01-07 DIAGNOSIS — R000 Tachycardia, unspecified: Secondary | ICD-10-CM | POA: Insufficient documentation

## 2019-01-07 DIAGNOSIS — E876 Hypokalemia: Secondary | ICD-10-CM | POA: Insufficient documentation

## 2019-01-07 DIAGNOSIS — G8929 Other chronic pain: Secondary | ICD-10-CM | POA: Insufficient documentation

## 2019-01-07 DIAGNOSIS — R1033 Periumbilical pain: Secondary | ICD-10-CM | POA: Insufficient documentation

## 2019-01-07 DIAGNOSIS — I10 Essential (primary) hypertension: Secondary | ICD-10-CM | POA: Insufficient documentation

## 2019-01-07 DIAGNOSIS — J45909 Unspecified asthma, uncomplicated: Secondary | ICD-10-CM | POA: Insufficient documentation

## 2019-01-07 DIAGNOSIS — F17210 Nicotine dependence, cigarettes, uncomplicated: Secondary | ICD-10-CM | POA: Insufficient documentation

## 2019-01-07 DIAGNOSIS — R109 Unspecified abdominal pain: Secondary | ICD-10-CM

## 2019-01-07 DIAGNOSIS — R55 Syncope and collapse: Secondary | ICD-10-CM | POA: Insufficient documentation

## 2019-01-07 DIAGNOSIS — R1013 Epigastric pain: Secondary | ICD-10-CM | POA: Insufficient documentation

## 2019-01-07 LAB — CBC WITH DIFFERENTIAL/PLATELET
Abs Immature Granulocytes: 0.03 10*3/uL (ref 0.00–0.07)
Basophils Absolute: 0 10*3/uL (ref 0.0–0.1)
Basophils Relative: 0 %
Eosinophils Absolute: 0.2 10*3/uL (ref 0.0–0.5)
Eosinophils Relative: 2 %
HCT: 36.5 % — ABNORMAL LOW (ref 39.0–52.0)
Hemoglobin: 11.8 g/dL — ABNORMAL LOW (ref 13.0–17.0)
Immature Granulocytes: 0 %
Lymphocytes Relative: 35 %
Lymphs Abs: 3 10*3/uL (ref 0.7–4.0)
MCH: 31.1 pg (ref 26.0–34.0)
MCHC: 32.3 g/dL (ref 30.0–36.0)
MCV: 96.1 fL (ref 80.0–100.0)
Monocytes Absolute: 0.9 10*3/uL (ref 0.1–1.0)
Monocytes Relative: 10 %
Neutro Abs: 4.4 10*3/uL (ref 1.7–7.7)
Neutrophils Relative %: 53 %
Platelets: 311 10*3/uL (ref 150–400)
RBC: 3.8 MIL/uL — ABNORMAL LOW (ref 4.22–5.81)
RDW: 17.2 % — ABNORMAL HIGH (ref 11.5–15.5)
WBC: 8.4 10*3/uL (ref 4.0–10.5)
nRBC: 0 % (ref 0.0–0.2)

## 2019-01-07 LAB — COMPREHENSIVE METABOLIC PANEL
ALT: 30 U/L (ref 0–44)
AST: 60 U/L — ABNORMAL HIGH (ref 15–41)
Albumin: 3.6 g/dL (ref 3.5–5.0)
Alkaline Phosphatase: 93 U/L (ref 38–126)
Anion gap: 13 (ref 5–15)
BUN: <5 mg/dL — ABNORMAL LOW (ref 6–20)
CO2: 21 mmol/L — ABNORMAL LOW (ref 22–32)
Calcium: 8.6 mg/dL — ABNORMAL LOW (ref 8.9–10.3)
Chloride: 103 mmol/L (ref 98–111)
Creatinine, Ser: 0.65 mg/dL (ref 0.61–1.24)
GFR calc Af Amer: >60 mL/min (ref >60)
GFR calc non Af Amer: >60 mL/min (ref >60)
Glucose, Bld: 162 mg/dL — ABNORMAL HIGH (ref 70–99)
Potassium: 3.1 mmol/L — ABNORMAL LOW (ref 3.5–5.1)
Sodium: 137 mmol/L (ref 135–145)
Total Bilirubin: 0.6 mg/dL (ref 0.3–1.2)
Total Protein: 7.2 g/dL (ref 6.5–8.1)

## 2019-01-07 LAB — LIPASE, BLOOD: Lipase: 32 U/L (ref 11–51)

## 2019-01-07 LAB — TROPONIN I (HIGH SENSITIVITY): Troponin I (High Sensitivity): 4 ng/L (ref <18)

## 2019-01-07 MED ORDER — LIDOCAINE VISCOUS HCL 2 % MT SOLN
15.0000 mL | Freq: Once | OROMUCOSAL | Status: AC
  Administered 2019-01-07: 21:00:00 15 mL via ORAL
  Filled 2019-01-07: qty 15

## 2019-01-07 MED ORDER — ALUM & MAG HYDROXIDE-SIMETH 200-200-20 MG/5ML PO SUSP
30.0000 mL | Freq: Once | ORAL | Status: AC
  Administered 2019-01-07: 30 mL via ORAL
  Filled 2019-01-07: qty 30

## 2019-01-07 MED ORDER — SODIUM CHLORIDE 0.9 % IV BOLUS
1000.0000 mL | Freq: Once | INTRAVENOUS | Status: AC
  Administered 2019-01-07: 1000 mL via INTRAVENOUS

## 2019-01-07 MED ORDER — POTASSIUM CHLORIDE CRYS ER 20 MEQ PO TBCR: 20.0000 meq | EXTENDED_RELEASE_TABLET | Freq: Every day | ORAL | 0 refills | Status: DC

## 2019-01-07 MED ORDER — LORAZEPAM 1 MG PO TABS
1.0000 mg | ORAL_TABLET | Freq: Once | ORAL | Status: AC
  Administered 2019-01-07: 22:00:00 1 mg via ORAL
  Filled 2019-01-07: qty 1

## 2019-01-07 MED ORDER — POTASSIUM CHLORIDE CRYS ER 20 MEQ PO TBCR
40.0000 meq | EXTENDED_RELEASE_TABLET | Freq: Once | ORAL | Status: AC
  Administered 2019-01-07: 40 meq via ORAL
  Filled 2019-01-07: qty 2

## 2019-01-07 NOTE — ED Triage Notes (Signed)
Pt c/o Left sided chest pain this afternoon. Hx of WPW. Pt reported he was walking and fell, doesn't remember falling, but remembers getting up. Reports last time this happed "it was my WPW." Also had nvd. Hx of bleeding ulcer and recent ED medication, asa and nitro held. 4mg  zofran given

## 2019-01-07 NOTE — ED Provider Notes (Signed)
[facility] EMERGENCY DEPARTMENT Provider Note   CSN: [id number] Arrival date & time: 01/07/19  2009     History   Chief Complaint Chief Complaint  Patient presents with   Chest Pain    HPI [name] is a 48 y.o. male.     HPI  48 year old male presents with abdominal pain and syncope.  He states that this afternoon he was going to the store and he passed out in the parking lot.  States he does not know why he fell though he wonders if it was his WPW.  He states his been feeling palpitations for a couple days.  Vomited earlier today as well but no blood.  States he last drank earlier today.  He is having acute on chronic upper abdominal pain and chest pain that he states is from his peptic ulcer and pancreatitis.  This pain is severe.  Some diarrhea earlier. No dyspnea.  Past Medical History:  Diagnosis Date   Alcoholism /alcohol abuse ([place])    Anemia    Anxiety    Arthritis    "knees; arms; elbows" (03/26/2015)   Asthma    Bipolar disorder (HCC)    Chronic bronchitis (HCC)    Chronic lower back pain    Chronic pancreatitis ([place])    Cocaine abuse ([place])    Depression    Family history of adverse reaction to anesthesia    "grandmother gets confused"   Femoral condyle fracture ([place]) 03/08/2014   left medial/notes 03/09/2014   GERD (gastroesophageal reflux disease)    H/O hiatal hernia    H/O suicide attempt 10/2012   High cholesterol    History of blood transfusion 10/2012   "when I tried to commit suicide"   History of stomach ulcers    Hypertension    Marijuana abuse, continuous    Migraine    "a few times/year" (03/26/2015)   Pneumonia 1990's X 3   PTSD (post-traumatic stress disorder)    Sickle cell trait ([place])    WPW (Wolff-Parkinson-White syndrome)    [name] 03/06/2013    Patient Active Problem List   Diagnosis Date Noted   AKI (acute kidney injury) ([place]) 11/13/2018   Seizure ([place]) 11/13/2018    Gastritis and gastroduodenitis    Chest pain 01/08/2018   GI bleed 11/24/2017   Acute blood loss anemia 11/24/2017   Atypical chest pain 11/24/2017   Acute pancreatitis 09/28/2017   Abdominal pain 05/27/2017   Hematemesis 05/27/2017   Tachycardia 03/18/2017   Diarrhea 03/18/2017   Acute on chronic pancreatitis ([place]) 12/17/2016   Intractable nausea and vomiting 12/05/2016   Verbally abusive behavior 12/05/2016   Normocytic anemia 12/05/2016   Alcohol use disorder, severe, dependence ([place]) 07/25/2016   Cocaine use disorder, severe, dependence ([place]) 07/25/2016   Major depressive disorder, 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/20/2016   Leukocytosis    Hospital acquired PNA 05/20/2015   Chronic pancreatitis ([place]) 05/18/2015   Pseudocyst of pancreas 05/18/2015   Polysubstance abuse (tobacco, cocaine, THC, and ETOH) 03/26/2015   Alcohol-induced chronic pancreatitis ([place])    Benign essential HTN 02/06/2014   Alcohol-induced acute pancreatitis 11/28/2013   Pancreatic pseudocyst/cyst 11/25/2013   Severe protein-calorie malnutrition ([place]) 10/10/2013   Suicide attempt ([place]) 10/08/2013   [name] Parkinson White pattern seen on electrocardiogram 10/03/2012   TOBACCO ABUSE 03/23/2007    Past Surgical History:  Procedure Laterality Date   BIOPSY  11/25/2017   Procedure: BIOPSY;  Surgeon: Arta Silence, MD;  Location: [facility]  ENDOSCOPY;  Service: Endoscopy;;   BIOPSY  10/14/2018   Procedure: BIOPSY;  Surgeon: Arta Silence, MD;  Location: [place];  Service: Endoscopy;;   CARDIAC CATHETERIZATION     ESOPHAGOGASTRODUODENOSCOPY (EGD) WITH PROPOFOL N/A 11/25/2017   Procedure: ESOPHAGOGASTRODUODENOSCOPY (EGD) WITH PROPOFOL;  Surgeon: Arta Silence, MD;  [facility] ENDOSCOPY;  Service: Endoscopy;  Laterality: N/A;   ESOPHAGOGASTRODUODENOSCOPY (EGD) WITH PROPOFOL Left 10/14/2018   Procedure: ESOPHAGOGASTRODUODENOSCOPY (EGD) WITH PROPOFOL;  Surgeon:  Arta Silence, MD;  Location: [facility] ENDOSCOPY;  Service: Endoscopy;  Laterality: Left;   ESOPHAGOGASTRODUODENOSCOPY (EGD) WITH PROPOFOL N/A 11/14/2018   Procedure: ESOPHAGOGASTRODUODENOSCOPY (EGD) WITH PROPOFOL;  Surgeon: Laurence Spates, MD;  Location: [facility] ENDOSCOPY;  Service: Gastroenterology;  Laterality: N/A;   EYE SURGERY Left 1990's   "result of trauma"    FACIAL FRACTURE SURGERY Left 1990's   "result of trauma"    FRACTURE SURGERY     HERNIA REPAIR     LEFT HEART CATHETERIZATION WITH CORONARY ANGIOGRAM Right 03/07/2013   Procedure: LEFT HEART CATHETERIZATION WITH CORONARY ANGIOGRAM;  Surgeon: [name], MD;  Location:  CATH LAB;  Service: Cardiovascular;  Laterality: Right;   UMBILICAL HERNIA REPAIR          Home Medications    Prior to Admission medications   Medication Sig Start Date End Date Taking? Authorizing Provider  acetaminophen (TYLENOL) 500 MG tablet Take 1,000 mg by mouth every 6 (six) hours as needed for mild pain.    [provider]  amitriptyline (ELAVIL) 25 MG tablet Take 1 tablet (25 mg total) by mouth at bedtime. 10/15/18   [name], MD  azithromycin (ZITHROMAX) 250 MG tablet Take 1 tablet (250 mg total) by mouth daily. Take first 2 tablets together, then 1 every day until finished. 12/26/18   Montine Circle, PA-C  Cyanocobalamin (VITAMIN B-12 PO) Take 1 tablet by mouth daily.    [provider]  folic acid (FOLVITE) 1 MG tablet Take 1 tablet (1 mg total) by mouth daily. 11/26/17   [name], MD  gabapentin (NEURONTIN) 100 MG capsule Take 1 capsule (100 mg total) by mouth 2 (two) times daily. 10/15/18   [name], MD  hydrALAZINE (APRESOLINE) 25 MG tablet Take 25 mg by mouth daily.    [provider]  lipase/protease/amylase (CREON) 12000 units CPEP capsule Take 1 capsule (12,000 Units total) by mouth 3 (three) times daily with meals. For pancreatitis 10/15/18   [name], MD  loratadine (CLARITIN) 10 MG  tablet Take 1 tablet (10 mg total) by mouth daily as needed for allergies or rhinitis. (May purchase from over the counter): For allergies Patient taking differently: Take 10 mg by mouth daily. (May purchase from over the counter): For allergies 10/01/17   Aline August, MD  LORazepam (ATIVAN) 0.5 MG tablet Take 0.5 mg by mouth every 8 (eight) hours as needed for anxiety.    [provider]  metoprolol tartrate (LOPRESSOR) 100 MG tablet Take 100 mg by mouth 2 (two) times daily.     [provider]  Multiple Vitamin (MULTIVITAMIN WITH MINERALS) TABS tablet Take 1 tablet by mouth daily. 09/06/17   Bonnell Public, MD  nicotine (NICODERM CQ - DOSED IN MG/24 HOURS) 21 mg/24hr patch Place 1 patch (21 mg total) onto the skin daily. 10/16/18   [name], MD  nicotine polacrilex (NICORETTE) 4 MG gum Take 4 mg by mouth daily as needed for smoking cessation.    [provider]  ondansetron (  ZOFRAN) 4 MG tablet Take 1 tablet (4 mg total) by mouth every 8 (eight) hours as needed for nausea or vomiting. 11/24/18   Nuala Alpha A, PA-C  oxyCODONE-acetaminophen (PERCOCET/ROXICET) 5-325 MG tablet Take 1 tablet by mouth every 8 (eight) hours as needed for severe pain.    [provider]  potassium chloride SA (K-DUR) 20 MEQ tablet Take 1 tablet (20 mEq total) by mouth daily. 01/07/19   [place] Gambler, MD  QUEtiapine (SEROQUEL) 100 MG tablet Take 100 mg by mouth at bedtime.    [provider]  sertraline (ZOLOFT) 100 MG tablet Take 1 tablet (100 mg total) by mouth daily. Patient taking differently: Take 150 mg by mouth daily.  08/29/17   Clent Demark, PA-C  sucralfate (CARAFATE) 1 g tablet Take 1 tablet (1 g total) by mouth 4 (four) times daily -  with meals and at bedtime. 12/26/18   Montine Circle, PA-C  thiamine 100 MG tablet Take 1 tablet (100 mg total) by mouth daily. 10/16/18   [name], MD    Family History Family History  Problem Relation Age  of Onset   Hypertension Mother    Cirrhosis Mother    Alcoholism Mother    Hypertension Father    Melanoma Father    Hypertension Other    Coronary artery disease Other     Social History Social History   Tobacco Use   Smoking status: Current Every Day Smoker    Packs/day: 1.00    Years: 33.00    Pack years: 33.00    Types: Cigarettes, E-cigarettes   Smokeless tobacco: Never Used  Substance Use Topics   Alcohol use: Yes   Drug use: Yes    Types: Marijuana, Cocaine    Comment: daily marijuana use; last cocaine use about 3 months ago     Allergies   Robaxin [methocarbamol], Shellfish-derived products, Trazodone, Trazodone and nefazodone, Adhesive [tape], Latex, Toradol [ketorolac tromethamine], Contrast media [iodinated diagnostic agents], and Reglan [metoclopramide]   Review of Systems Review of Systems  Constitutional: Negative for fever.  Respiratory: Negative for shortness of breath.   Cardiovascular: Positive for chest pain and palpitations.  Gastrointestinal: Positive for abdominal pain, diarrhea and vomiting.  Neurological: Positive for syncope.  All other systems reviewed and are negative.    Physical Exam Updated Vital Signs BP 123/88    Pulse 84    Temp 98.6 F (37 C) (Oral)    Resp 14    SpO2 100%   Physical Exam Vitals signs and nursing note reviewed.  Constitutional:      Appearance: He is well-developed. He is not diaphoretic.  HENT:     Head: Normocephalic and atraumatic.     Right Ear: External ear normal.     Left Ear: External ear normal.     Nose: Nose normal.  Eyes:     General:        Right eye: No discharge.        Left eye: No discharge.  Neck:     Musculoskeletal: Neck supple.  Cardiovascular:     Rate and Rhythm: Regular rhythm. Tachycardia present.     Heart sounds: Normal heart sounds.  Pulmonary:     Effort: Pulmonary effort is normal.     Breath sounds: Normal breath sounds.  Abdominal:     Palpations: Abdomen  is soft.     Tenderness: There is abdominal tenderness in the epigastric area and periumbilical area.  Skin:    General:  Skin is warm and dry.  Neurological:     Mental Status: He is alert.  Psychiatric:        Mood and Affect: Mood is not anxious.      ED Treatments / Results  Labs (all labs ordered are listed, but only abnormal results are displayed) Labs Reviewed  CBC WITH DIFFERENTIAL/PLATELET - Abnormal; Notable for the following components:      Result Value   RBC 3.80 (*)    Hemoglobin 11.8 (*)    HCT 36.5 (*)    RDW 17.2 (*)    All other components within normal limits  COMPREHENSIVE METABOLIC PANEL - Abnormal; Notable for the following components:   Potassium 3.1 (*)    CO2 21 (*)    Glucose, Bld 162 (*)    BUN <5 (*)    Calcium 8.6 (*)    AST 60 (*)    All other components within normal limits  LIPASE, BLOOD  TROPONIN I (HIGH SENSITIVITY)  TROPONIN I (HIGH SENSITIVITY)    EKG EKG Interpretation  Date/Time:  Monday January 07 2019 20:16:21 EDT Ventricular Rate:  101 PR Interval:    QRS Duration: 72 QT Interval:  305 QTC Calculation: 396 R Axis:   74 Text Interpretation:  Sinus tachycardia LAE, consider biatrial enlargement Abnrm T, consider ischemia, anterolateral lds no significant change since Dec 31 2018 Confirmed by [place] Gambler [phone]) on 01/07/2019 8:38:11 PM   Radiology Dg Chest Portable 1 View  Result Date: 01/07/2019 CLINICAL DATA:  Acute chest pain. EXAM: PORTABLE CHEST 1 VIEW COMPARISON:  12/25/2018 and prior studies FINDINGS: The cardiomediastinal silhouette is unremarkable. There is no evidence of focal airspace disease, pulmonary edema, suspicious pulmonary nodule/mass, pleural effusion, or pneumothorax. No acute bony abnormalities are identified. IMPRESSION: No active disease. Electronically Signed   By: Margarette Canada M.D.   On: 01/07/2019 21:26    Procedures Procedures (including critical care time)  Medications Ordered in  ED Medications  sodium chloride 0.9 % bolus 1,000 mL (0 mLs Intravenous Stopped 01/07/19 2307)  alum & mag hydroxide-simeth (MAALOX/MYLANTA) 200-200-20 MG/5ML suspension 30 mL (30 mLs Oral Given 01/07/19 2124)    And  lidocaine (XYLOCAINE) 2 % viscous mouth solution 15 mL (15 mLs Oral Given 01/07/19 2124)  LORazepam (ATIVAN) tablet 1 mg (1 mg Oral Given 01/07/19 2137)  potassium chloride SA (K-DUR) CR tablet 40 mEq (40 mEq Oral Given 01/07/19 2307)     Initial Impression / Assessment and Plan / ED Course  I have reviewed the triage vital signs and the nursing notes.  Pertinent labs & imaging results that were available during my care of the patient were reviewed by me and considered in my medical decision making (see chart for details).        Labs are fairly unremarkable besides the hypokalemia which was repleted.  As far as his syncope, could be arrhythmia but more likely related to dehydration from chronic alcohol abuse and vomiting.  I discussed with Dr. Elson Areas, who went through chart and ECGs and at this point the patient does not appear peer to have any indication of WPW.  From a cardiology standpoint would be okay to be discharged.  Recently had an admission at [facility] a couple months ago for syncope.  I think arrhythmia is less likely and that he can be discharged.  As far as his chronic abdominal pain, I do not think a CT would be warranted.  I also think narcotics are not in his  best interest as this is probably 1 of the reasons he is recurrently coming back to the ER. Discharge with return precautions.  Final Clinical Impressions(s) / ED Diagnoses   Final diagnoses:  Chronic abdominal pain  Hypokalemia    ED Discharge Orders         Ordered    potassium chloride SA (K-DUR) 20 MEQ tablet  Daily     01/07/19 2257           [place] Gambler, MD 01/07/19 2345

## 2019-01-07 NOTE — Discharge Instructions (Signed)
If you develop worsening, continued, or recurrent abdominal pain, uncontrolled vomiting, fever, chest or back pain, or any other new/concerning symptoms then return to the ER for evaluation.  

## 2019-01-07 NOTE — ED Notes (Addendum)
Pt requesting something for his "shaking." states he drank a 24 oz beer around 1600 today because he felt like he was having withdrawal symptoms since he had not had any alcohol in 3 days. Dr. Regenia Skeeter made aware of patient's request

## 2019-03-02 ENCOUNTER — Encounter (HOSPITAL_COMMUNITY): Payer: Self-pay | Admitting: Emergency Medicine

## 2019-03-02 ENCOUNTER — Other Ambulatory Visit: Payer: Self-pay

## 2019-03-02 ENCOUNTER — Emergency Department (HOSPITAL_COMMUNITY)
Admission: EM | Admit: 2019-03-02 | Discharge: 2019-03-02 | Disposition: A | Discharge status: Home / Self Care | Payer: Self-pay | Attending: Emergency Medicine | Admitting: Emergency Medicine

## 2019-03-02 ENCOUNTER — Emergency Department (HOSPITAL_COMMUNITY): Payer: Self-pay

## 2019-03-02 DIAGNOSIS — R1013 Epigastric pain: Secondary | ICD-10-CM | POA: Insufficient documentation

## 2019-03-02 DIAGNOSIS — F17210 Nicotine dependence, cigarettes, uncomplicated: Secondary | ICD-10-CM | POA: Insufficient documentation

## 2019-03-02 DIAGNOSIS — Z91040 Latex allergy status: Secondary | ICD-10-CM | POA: Insufficient documentation

## 2019-03-02 DIAGNOSIS — G8929 Other chronic pain: Secondary | ICD-10-CM

## 2019-03-02 DIAGNOSIS — I10 Essential (primary) hypertension: Secondary | ICD-10-CM | POA: Insufficient documentation

## 2019-03-02 DIAGNOSIS — R197 Diarrhea, unspecified: Secondary | ICD-10-CM | POA: Insufficient documentation

## 2019-03-02 DIAGNOSIS — Z79899 Other long term (current) drug therapy: Secondary | ICD-10-CM | POA: Insufficient documentation

## 2019-03-02 DIAGNOSIS — R112 Nausea with vomiting, unspecified: Secondary | ICD-10-CM | POA: Insufficient documentation

## 2019-03-02 LAB — COMPREHENSIVE METABOLIC PANEL
ALT: 49 U/L — ABNORMAL HIGH (ref 0–44)
AST: 65 U/L — ABNORMAL HIGH (ref 15–41)
Albumin: 4.4 g/dL (ref 3.5–5.0)
Alkaline Phosphatase: 95 U/L (ref 38–126)
Anion gap: 16 — ABNORMAL HIGH (ref 5–15)
BUN: 9 mg/dL (ref 6–20)
CO2: 14 mmol/L — ABNORMAL LOW (ref 22–32)
Calcium: 9.2 mg/dL (ref 8.9–10.3)
Chloride: 106 mmol/L (ref 98–111)
Creatinine, Ser: 0.89 mg/dL (ref 0.61–1.24)
GFR calc Af Amer: >60 mL/min (ref >60)
GFR calc non Af Amer: >60 mL/min (ref >60)
Glucose, Bld: 87 mg/dL (ref 70–99)
Potassium: 3.7 mmol/L (ref 3.5–5.1)
Sodium: 136 mmol/L (ref 135–145)
Total Bilirubin: 1.1 mg/dL (ref 0.3–1.2)
Total Protein: 8 g/dL (ref 6.5–8.1)

## 2019-03-02 LAB — URINALYSIS, ROUTINE W REFLEX MICROSCOPIC
Bilirubin Urine: NEGATIVE
Glucose, UA: NEGATIVE mg/dL
Hgb urine dipstick: NEGATIVE
Ketones, ur: 5 mg/dL — AB
Leukocytes,Ua: NEGATIVE
Nitrite: NEGATIVE
Protein, ur: NEGATIVE mg/dL
Specific Gravity, Urine: 1.017 (ref 1.005–1.030)
pH: 5 (ref 5.0–8.0)

## 2019-03-02 LAB — CBC
HCT: 35.8 % — ABNORMAL LOW (ref 39.0–52.0)
Hemoglobin: 12.1 g/dL — ABNORMAL LOW (ref 13.0–17.0)
MCH: 32.3 pg (ref 26.0–34.0)
MCHC: 33.8 g/dL (ref 30.0–36.0)
MCV: 95.5 fL (ref 80.0–100.0)
Platelets: 200 10*3/uL (ref 150–400)
RBC: 3.75 MIL/uL — ABNORMAL LOW (ref 4.22–5.81)
RDW: 19.1 % — ABNORMAL HIGH (ref 11.5–15.5)
WBC: 7.3 10*3/uL (ref 4.0–10.5)
nRBC: 0 % (ref 0.0–0.2)

## 2019-03-02 LAB — TROPONIN I (HIGH SENSITIVITY)
Troponin I (High Sensitivity): 5 ng/L (ref <18)
Troponin I (High Sensitivity): 5 ng/L (ref <18)

## 2019-03-02 LAB — LIPASE, BLOOD: Lipase: 22 U/L (ref 11–51)

## 2019-03-02 MED ORDER — DIPHENHYDRAMINE HCL 50 MG/ML IJ SOLN
12.5000 mg | Freq: Once | INTRAMUSCULAR | Status: AC
  Administered 2019-03-02: 12.5 mg via INTRAVENOUS
  Filled 2019-03-02: qty 1

## 2019-03-02 MED ORDER — LORAZEPAM 2 MG/ML IJ SOLN
1.0000 mg | Freq: Once | INTRAMUSCULAR | Status: AC
  Administered 2019-03-02: 1 mg via INTRAVENOUS
  Filled 2019-03-02: qty 1

## 2019-03-02 MED ORDER — OXYCODONE-ACETAMINOPHEN 5-325 MG PO TABS
1.0000 | ORAL_TABLET | Freq: Once | ORAL | Status: AC
  Administered 2019-03-02: 1 via ORAL
  Filled 2019-03-02: qty 1

## 2019-03-02 MED ORDER — ONDANSETRON HCL 4 MG/2ML IJ SOLN
4.0000 mg | Freq: Once | INTRAMUSCULAR | Status: DC
  Filled 2019-03-02: qty 2

## 2019-03-02 MED ORDER — PROCHLORPERAZINE EDISYLATE 10 MG/2ML IJ SOLN
10.0000 mg | Freq: Once | INTRAMUSCULAR | Status: AC
  Administered 2019-03-02: 10 mg via INTRAVENOUS
  Filled 2019-03-02: qty 2

## 2019-03-02 MED ORDER — SODIUM CHLORIDE 0.9% FLUSH
3.0000 mL | Freq: Once | INTRAVENOUS | Status: AC
  Administered 2019-03-02: 3 mL via INTRAVENOUS

## 2019-03-02 MED ORDER — PROMETHAZINE HCL 25 MG PO TABS: 25.0000 mg | ORAL_TABLET | Freq: Four times a day (QID) | ORAL | 0 refills | Status: DC | PRN

## 2019-03-02 MED ORDER — ALUM & MAG HYDROXIDE-SIMETH 200-200-20 MG/5ML PO SUSP
30.0000 mL | Freq: Once | ORAL | Status: AC
  Administered 2019-03-02: 30 mL via ORAL
  Filled 2019-03-02: qty 30

## 2019-03-02 MED ORDER — SODIUM CHLORIDE 0.9 % IV BOLUS
1000.0000 mL | Freq: Once | INTRAVENOUS | Status: AC
  Administered 2019-03-02: 1000 mL via INTRAVENOUS

## 2019-03-02 MED ORDER — LIDOCAINE VISCOUS HCL 2 % MT SOLN
15.0000 mL | Freq: Once | OROMUCOSAL | Status: AC
  Administered 2019-03-02: 15 mL via ORAL
  Filled 2019-03-02: qty 15

## 2019-03-02 NOTE — ED Notes (Signed)
Pt states that be vomited in restroom ,"red, coffee grounds"; EDP notified; pt directed to use emesis bag in room so emesis can be evaluated

## 2019-03-02 NOTE — Discharge Instructions (Signed)
You were seen in the ER for nausea, vomiting, diarrhea abdominal and chest pain  Labs are stable compared to last  You had a recent endoscopy and CT scan  Ongoing alcohol use and fried, spicy foods are common triggers of gastritis and ulcer symptoms, avoid these  Use phenergan every 6 hours for nausea.  You can insert this rectally as well if nauseated or vomiting

## 2019-03-02 NOTE — ED Triage Notes (Signed)
Report from [facility]> Pt from home.  C/o generalized abd pain, epigastric pain, nausea, vomiting, and diarrhea.  States he vomited "a pint" of blood this morning.  Also reports L sided chest pain x 40 min that radiates to L jaw, L arm, and back.  EMS administered Morphine 4mg  IV that decreased pain from 8-6/10.

## 2019-03-02 NOTE — ED Notes (Signed)
Patient transported to X-ray 

## 2019-03-02 NOTE — ED Provider Notes (Addendum)
[facility] EMERGENCY DEPARTMENT Provider Note   CSN: [id number] Arrival date & time: 03/02/19  [id number]     History   Chief Complaint Chief Complaint  Patient presents with  . Chest Pain  . Abdominal Pain  . Emesis    HPI [name] is a 48 y.o. male with history of hypertension, asthma, WPW, sickle cell trait, gastric ulcer disease, polysubstance and alcohol abuse, renal insufficiency, chronic abdominal and chest pain, chronic pancreatitis here for evaluation of chest and abdominal pain that began around 6 AM today.  Patient states he had several episodes of dark brown pasty diarrhea and then had sudden onset nausea and bloody vomiting.  Describes it as bright red blood "a pint".  He has vomited 4-5 times since.  He developed sudden sharp substernal chest pain that radiates to his jaw and arm and back at around 1 hour prior to arrival.  This is intermittent.  States he had just eaten grits prior to onset of symptoms.  He drinks 240 ounce beers every day and he last drank yesterday.  He is compliant with Creon, Protonix and Carafate pills.  He takes Tylenol as needed for chronic pain at home but states this does not work.  He has had pain clinic appointment but states they are asking him for $300 for the visit and he is not able to afford it.  He received morphine IV via EMS and states this made his pain worse.  He denies any fever, shortness of breath, melena, dysuria.     HPI  Past Medical History:  Diagnosis Date  . Alcoholism /alcohol abuse ([place])   . Anemia   . Anxiety   . Arthritis    "knees; arms; elbows" (03/26/2015)  . Asthma   . Bipolar disorder ([place])   . Chronic bronchitis ([place])   . Chronic lower back pain   . Chronic pancreatitis ([place])   . Cocaine abuse ([place])   . Depression   . Family history of adverse reaction to anesthesia    "grandmother gets confused"  . Femoral condyle fracture ([place]) 03/08/2014   left medial/notes 03/09/2014  .  GERD (gastroesophageal reflux disease)   . H/O hiatal hernia   . H/O suicide attempt 10/2012  . High cholesterol   . History of blood transfusion 10/2012   "when I tried to commit suicide"  . History of stomach ulcers   . Hypertension   . Marijuana abuse, continuous   . Migraine    "a few times/year" (03/26/2015)  . Pneumonia 1990's X 3  . PTSD (post-traumatic stress disorder)   . Sickle cell trait ([place])   . WPW (Wolff-Parkinson-White syndrome)    [name] 03/06/2013    Patient Active Problem List   Diagnosis Date Noted  . AKI (acute kidney injury) ([place]) 11/13/2018  . Seizure ([place]) 11/13/2018  . Gastritis and gastroduodenitis   . Chest pain 01/08/2018  . GI bleed 11/24/2017  . Acute blood loss anemia 11/24/2017  . Atypical chest pain 11/24/2017  . Acute pancreatitis 09/28/2017  . Abdominal pain 05/27/2017  . Hematemesis 05/27/2017  . Tachycardia 03/18/2017  . Diarrhea 03/18/2017  . Acute on chronic pancreatitis ([place]) 12/17/2016  . Intractable nausea and vomiting 12/05/2016  . Verbally abusive behavior 12/05/2016  . Normocytic anemia 12/05/2016  . Alcohol use disorder, severe, dependence ([place]) 07/25/2016  . Cocaine use disorder, severe, dependence ([place]) 07/25/2016  . 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 Leukocytosis   .  Hospital acquired PNA 05/20/2015  . Chronic pancreatitis ([place]) 05/18/2015  . Pseudocyst of pancreas 05/18/2015  . Polysubstance abuse (tobacco, cocaine, THC, and ETOH) 03/26/2015  . Alcohol-induced chronic pancreatitis ([place])   . Benign essential HTN 02/06/2014  . Alcohol-induced acute pancreatitis 11/28/2013  . Pancreatic pseudocyst/cyst 11/25/2013  . Severe protein-calorie malnutrition ([place]) 10/10/2013  . Suicide attempt ([place]) 10/08/2013  . 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 TOBACCO ABUSE 03/23/2007    Past Surgical History:  Procedure Laterality Date  . BIOPSY   11/25/2017   Procedure: BIOPSY;  Surgeon: Arta Silence, MD;  Location: [facility] ENDOSCOPY;  Service: Endoscopy;;  . BIOPSY  10/14/2018   Procedure: BIOPSY;  Surgeon: Arta Silence, MD;  Location: [facility];  Service: Endoscopy;;  . CARDIAC CATHETERIZATION    . ESOPHAGOGASTRODUODENOSCOPY (EGD) WITH PROPOFOL N/A 11/25/2017   Procedure: ESOPHAGOGASTRODUODENOSCOPY (EGD) WITH PROPOFOL;  Surgeon: Arta Silence, MD;  Location: [facility];  Service: Endoscopy;  Laterality: N/A;  . ESOPHAGOGASTRODUODENOSCOPY (EGD) WITH PROPOFOL Left 10/14/2018   Procedure: ESOPHAGOGASTRODUODENOSCOPY (EGD) WITH PROPOFOL;  Surgeon: Arta Silence, MD;  Location[facility] ENDOSCOPY;  Service: Endoscopy;  Laterality: Left;  . ESOPHAGOGASTRODUODENOSCOPY (EGD) WITH PROPOFOL N/A 11/14/2018   Procedure: ESOPHAGOGASTRODUODENOSCOPY (EGD) WITH PROPOFOL;  Surgeon: Laurence Spates, MD;  Location: [facility] ENDOSCOPY;  Service: Gastroenterology;  Laterality: N/A;  . EYE SURGERY Left 1990's   "result of trauma"   . FACIAL FRACTURE SURGERY Left 1990's   "result of trauma"   . FRACTURE SURGERY    . HERNIA REPAIR    . LEFT HEART CATHETERIZATION WITH CORONARY ANGIOGRAM Right 03/07/2013   Procedure: LEFT HEART CATHETERIZATION WITH CORONARY ANGIOGRAM;  Surgeon: [name], MD;  Location: [facility] CATH LAB;  Service: Cardiovascular;  Laterality: Right;  . UMBILICAL HERNIA REPAIR          Home Medications    Prior to Admission medications   Medication Sig Start Date End Date Taking? Authorizing Provider  acetaminophen (TYLENOL) 500 MG tablet Take 1,000 mg by mouth every 6 (six) hours as needed for mild pain.   Yes [provider]  amitriptyline (ELAVIL) 25 MG tablet Take 1 tablet (25 mg total) by mouth at bedtime. 10/15/18  Yes [name], MD  Cyanocobalamin (VITAMIN B-12 PO) Take 1 tablet by mouth daily.   Yes [provider]  folic acid (FOLVITE) 1 MG tablet Take 1 tablet (1 mg total) by mouth daily. 11/26/17  Yes [name], MD  gabapentin (NEURONTIN) 100 MG capsule Take 1 capsule (100 mg total) by mouth 2 (two) times daily. 10/15/18  Yes [name], MD  hydrALAZINE (APRESOLINE) 25 MG tablet Take 25 mg by mouth daily.   Yes [provider]  lipase/protease/amylase (CREON) 12000 units CPEP capsule Take 1 capsule (12,000 Units total) by mouth 3 (three) times daily with meals. For pancreatitis 10/15/18  Yes [name], MD  loratadine (CLARITIN) 10 MG tablet Take 1 tablet (10 mg total) by mouth daily as needed for allergies or rhinitis. (May purchase from over the counter): For allergies Patient taking differently: Take 10 mg by mouth daily. (May purchase from over the counter): For allergies 10/01/17  Yes Aline August, MD  LORazepam (ATIVAN) 0.5 MG tablet Take 0.5 mg by mouth every 8 (eight) hours as needed for anxiety.   Yes [provider]  metoprolol tartrate (LOPRESSOR) 100 MG tablet Take 100 mg by mouth 2 (two) times daily.    Yes [provider]  Multiple Vitamin (  MULTIVITAMIN WITH MINERALS) TABS tablet Take 1 tablet by mouth daily. 09/06/17  Yes Dana Allan I, MD  nicotine polacrilex (NICORETTE) 4 MG gum Take 4 mg by mouth daily as needed for smoking cessation.   Yes [provider]  ondansetron (ZOFRAN) 4 MG tablet Take 1 tablet (4 mg total) by mouth every 8 (eight) hours as needed for nausea or vomiting. 11/24/18  Yes Nuala Alpha A, PA-C  oxyCODONE-acetaminophen (PERCOCET/ROXICET) 5-325 MG tablet Take 1 tablet by mouth every 8 (eight) hours as needed for severe pain.   Yes [provider]  pantoprazole (PROTONIX) 40 MG tablet Take 40 mg by mouth daily.   Yes [provider]  potassium chloride SA (K-DUR) 20 MEQ tablet Take 1 tablet (20 mEq total) by mouth daily. 01/07/19  Yes Sherwood Gambler, MD  QUEtiapine (SEROQUEL) 100 MG tablet Take 100 mg by mouth at bedtime.   Yes [provider]  sertraline (ZOLOFT) 100 MG tablet Take 1  tablet (100 mg total) by mouth daily. Patient taking differently: Take 150 mg by mouth daily.  08/29/17  Yes Clent Demark, PA-C  sucralfate (CARAFATE) 1 g tablet Take 1 tablet (1 g total) by mouth 4 (four) times daily -  with meals and at bedtime. 12/26/18  Yes Montine Circle, PA-C  thiamine 100 MG tablet Take 1 tablet (100 mg total) by mouth daily. 10/16/18  Yes [name], MD  azithromycin (ZITHROMAX) 250 MG tablet Take 1 tablet (250 mg total) by mouth daily. Take first 2 tablets together, then 1 every day until finished. Patient not taking: Reported on 03/02/2019 12/26/18   Montine Circle, PA-C  cyclobenzaprine (FLEXERIL) 10 MG tablet Take 1 tablet (10 mg total) by mouth 2 (two) times daily as needed for muscle spasms. 03/03/19   Recardo Evangelist, PA-C  nicotine (NICODERM CQ - DOSED IN MG/24 HOURS) 21 mg/24hr patch Place 1 patch (21 mg total) onto the skin daily. Patient not taking: Reported on 03/02/2019 10/16/18   [name], MD  promethazine (PHENERGAN) 25 MG tablet Take 1 tablet (25 mg total) by mouth every 6 (six) hours as needed for nausea or vomiting. 03/02/19   Kinnie Feil, PA-C    Family History Family History  Problem Relation Age of Onset  . Hypertension Mother   . Cirrhosis Mother   . Alcoholism Mother   . Hypertension Father   . Melanoma Father   . Hypertension Other   . Coronary artery disease Other     Social History Social History   Tobacco Use  . Smoking status: Current Every Day Smoker    Packs/day: 1.00    Years: 33.00    Pack years: 33.00    Types: Cigarettes, E-cigarettes  . Smokeless tobacco: Never Used  Substance Use Topics  . Alcohol use: Yes  . Drug use: Yes    Types: Marijuana, Cocaine    Comment: daily marijuana use; last cocaine use about 3 months ago     Allergies   Robaxin [methocarbamol], Shellfish-derived products, Trazodone, Trazodone and nefazodone, Adhesive [tape], Latex, Toradol [ketorolac tromethamine], Contrast  media [iodinated diagnostic agents], and Reglan [metoclopramide]   Review of Systems Review of Systems  Cardiovascular: Positive for chest pain.  Gastrointestinal: Positive for abdominal pain, diarrhea, nausea and vomiting.  All other systems reviewed and are negative.    Physical Exam Updated Vital Signs BP (!) 119/93   Pulse 74   Temp 98.3 F (36.8 C) (Oral)   Resp 15  SpO2 100%   Physical Exam Vitals signs and nursing note reviewed.  Constitutional:      Appearance: He is well-developed.     Comments: Non toxic.  Well-appearing.  HENT:     Head: Normocephalic and atraumatic.     Nose: Nose normal.  Eyes:     Conjunctiva/sclera: Conjunctivae normal.  Neck:     Musculoskeletal: Normal range of motion.  Cardiovascular:     Rate and Rhythm: Normal rate and regular rhythm.     Pulses:          Radial pulses are 1+ on the right side and 1+ on the left side.       Dorsalis pedis pulses are 1+ on the right side and 1+ on the left side.     Heart sounds: Normal heart sounds.  Pulmonary:     Effort: Pulmonary effort is normal.     Breath sounds: Normal breath sounds.  Abdominal:     General: Bowel sounds are normal.     Palpations: Abdomen is soft.     Tenderness: There is abdominal tenderness (Generalized).     Comments: No G/R/R. No suprapubic or CVA tenderness. Negative Murphy's and McBurney's.  No pulsatility.  Soft.  No guarding, rigidity or rebound.  Active bowel sounds to lower quadrants.  Musculoskeletal: Normal range of motion.  Skin:    General: Skin is warm and dry.     Capillary Refill: Capillary refill takes less than 2 seconds.  Neurological:     Mental Status: He is alert.     Comments: Sensation and strength intact in upper and lower extremities  Psychiatric:        Behavior: Behavior normal.      ED Treatments / Results  Labs (all labs ordered are listed, but only abnormal results are displayed) Labs Reviewed  COMPREHENSIVE METABOLIC PANEL -  Abnormal; Notable for the following components:      Result Value   CO2 14 (*)    AST 65 (*)    ALT 49 (*)    Anion gap 16 (*)    All other components within normal limits  CBC - Abnormal; Notable for the following components:   RBC 3.75 (*)    Hemoglobin 12.1 (*)    HCT 35.8 (*)    RDW 19.1 (*)    All other components within normal limits  URINALYSIS, ROUTINE W REFLEX MICROSCOPIC - Abnormal; Notable for the following components:   Ketones, ur 5 (*)    All other components within normal limits  LIPASE, BLOOD  TROPONIN I (HIGH SENSITIVITY)  TROPONIN I (HIGH SENSITIVITY)    EKG EKG Interpretation  Date/Time:  Saturday March 02 2019 08:44:36 EDT Ventricular Rate:  90 PR Interval:  106 QRS Duration: 82 QT Interval:  332 QTC Calculation: 406 R Axis:   71 Text Interpretation:  Sinus rhythm with marked sinus arrhythmia with short PR Right atrial enlargement T wave abnormality, consider anterior ischemia Abnormal ECG no sig change from previous Confirmed by Charlesetta Shanks [phone]) on 03/02/2019 11:54:06 AM   Radiology Dg Chest 2 View  Result Date: 03/02/2019 CLINICAL DATA:  Chest pain EXAM: CHEST - 2 VIEW COMPARISON:  12/25/2018 chest radiograph. FINDINGS: Stable cardiomediastinal silhouette with normal heart size. No pneumothorax. No pleural effusion. Lungs appear clear, with no acute consolidative airspace disease and no pulmonary edema. Subacute healing versus chronic fractures in lateral aspect of right seventh and eighth ribs. IMPRESSION: 1. No active cardiopulmonary disease. 2. Subacute healing versus  chronic fractures in lateral aspect of right seventh and eighth ribs. Electronically Signed   By: Ilona Sorrel M.D.   On: 03/02/2019 10:06    Procedures Procedures (including critical care time)  Medications Ordered in ED Medications  sodium chloride flush (NS) 0.9 % injection 3 mL (3 mLs Intravenous Given 03/02/19 1018)  alum & mag hydroxide-simeth (MAALOX/MYLANTA)  200-200-20 MG/5ML suspension 30 mL (30 mLs Oral Given 03/02/19 1016)    And  lidocaine (XYLOCAINE) 2 % viscous mouth solution 15 mL (15 mLs Oral Given 03/02/19 1016)  LORazepam (ATIVAN) injection 1 mg (1 mg Intravenous Given 03/02/19 1017)  sodium chloride 0.9 % bolus 1,000 mL (0 mLs Intravenous Stopped 03/02/19 1228)  oxyCODONE-acetaminophen (PERCOCET/ROXICET) 5-325 MG per tablet 1 tablet (1 tablet Oral Given 03/02/19 1341)  prochlorperazine (COMPAZINE) injection 10 mg (10 mg Intravenous Given 03/02/19 1303)  diphenhydrAMINE (BENADRYL) injection 12.5 mg (12.5 mg Intravenous Given 03/02/19 1300)     Initial Impression / Assessment and Plan / ED Course  I have reviewed the triage vital signs and the nursing notes.  Pertinent labs & imaging results that were available during my care of the patient were reviewed by me and considered in my medical decision making (see chart for details).  Clinical Course as of Mar 02 1546  Sat Mar 02, 2019  1038 1. No active cardiopulmonary disease. 2. Subacute healing versus chronic fractures in lateral aspect of right seventh and eighth ribs.  DG Chest 2 View [CG]  1038 Hemoglobin(!): 12.1 [CG]  1038 AST(!): 65 [CG]  1038 ALT(!): 49 [CG]  1038 Anion gap(!): 16 [CG]  1058 Ketones, ur(!): 5 [CG]  1129 Re-evaluated patient. States he has not had emesis since arrival.  Has drank 1 cup of water.    [CG]  1251 RN notified me pt ambulated to bathroom and threw one "bright red blood with coffee grounds".   No clinical decline on re-evaluation.  Will given antiemetic and re attempt oral challenge    [CG]  1252 Troponin I (High Sensitivity): 5 [CG]    Clinical Course User Index [CG] Kinnie Feil, PA-C   EMR reviewed.  Endoscopy upper 11/2018 reviewed showed no abnormalities and ulcers previously seen completely healed.  She had a CT A/P on 11/2018.  ER work-up reviewed unremarkable as above, minimal anemia but hemoglobin stable.  LFTs minimally elevated.   Ketones and anion gap borderline.  Suspect this is from chronic alcohol abuse, mild dehydration.  Chest x-ray is without abnormalities, widened mediastinum, obvious air.  EKG is without ischemia or acute changes.  Troponin x2 unchanged.  Symptoms likely acute on chronic in nature probably related to ongoing alcohol use, gastritis, pancreatitis, viral process possibly as well.  Patient reevaluated and he had no clinical decline actually felt better and had improvement in abdominal tenderness on exam. Symptoms controlled in the ER.  He is tolerating fluid.  I considered life-threatening process such as perforated viscus, ACS, dissection highly unlikely given the chronicity of his symptoms and recurrent ER visits with similar presentations, work-up.  Discharged with recommendation to continue Protonix, Carafate, Creon.  Recommended alcohol cessation.  Return precautions discussed.  He is comfortable with this plan.  Final Clinical Impressions(s) / ED Diagnoses   Final diagnoses:  Chronic epigastric pain  Nausea vomiting and diarrhea    ED Discharge Orders         Ordered    promethazine (PHENERGAN) 25 MG tablet  Every 6 hours PRN     03/02/19 1147  Kinnie Feil, PA-C 03/03/19 1547    Charlesetta Shanks, MD 03/04/19 [phone]

## 2019-03-03 ENCOUNTER — Other Ambulatory Visit: Payer: Self-pay

## 2019-03-03 ENCOUNTER — Encounter (HOSPITAL_COMMUNITY): Payer: Self-pay | Admitting: Emergency Medicine

## 2019-03-03 ENCOUNTER — Emergency Department (HOSPITAL_COMMUNITY)
Admission: EM | Admit: 2019-03-03 | Discharge: 2019-03-03 | Disposition: A | Discharge status: Home / Self Care | Payer: Self-pay | Attending: Emergency Medicine | Admitting: Emergency Medicine

## 2019-03-03 DIAGNOSIS — J45909 Unspecified asthma, uncomplicated: Secondary | ICD-10-CM | POA: Insufficient documentation

## 2019-03-03 DIAGNOSIS — I10 Essential (primary) hypertension: Secondary | ICD-10-CM | POA: Insufficient documentation

## 2019-03-03 DIAGNOSIS — R251 Tremor, unspecified: Secondary | ICD-10-CM | POA: Insufficient documentation

## 2019-03-03 DIAGNOSIS — Z79899 Other long term (current) drug therapy: Secondary | ICD-10-CM | POA: Insufficient documentation

## 2019-03-03 DIAGNOSIS — Z91040 Latex allergy status: Secondary | ICD-10-CM | POA: Insufficient documentation

## 2019-03-03 DIAGNOSIS — F17210 Nicotine dependence, cigarettes, uncomplicated: Secondary | ICD-10-CM | POA: Insufficient documentation

## 2019-03-03 MED ORDER — LORAZEPAM 2 MG/ML IJ SOLN
1.0000 mg | Freq: Once | INTRAMUSCULAR | Status: AC
  Administered 2019-03-03: 10:00:00 1 mg via INTRAMUSCULAR
  Filled 2019-03-03: qty 1

## 2019-03-03 MED ORDER — CYCLOBENZAPRINE HCL 10 MG PO TABS: 10.0000 mg | ORAL_TABLET | Freq: Two times a day (BID) | ORAL | 0 refills | Status: DC | PRN

## 2019-03-03 MED ORDER — LIDOCAINE VISCOUS HCL 2 % MT SOLN
15.0000 mL | Freq: Once | OROMUCOSAL | Status: AC
  Administered 2019-03-03: 15 mL via OROMUCOSAL
  Filled 2019-03-03: qty 15

## 2019-03-03 MED ORDER — ALUM & MAG HYDROXIDE-SIMETH 200-200-20 MG/5ML PO SUSP
30.0000 mL | Freq: Once | ORAL | Status: AC
  Administered 2019-03-03: 30 mL via ORAL
  Filled 2019-03-03: qty 30

## 2019-03-03 MED ORDER — LORAZEPAM 2 MG/ML IJ SOLN
1.0000 mg | Freq: Once | INTRAMUSCULAR | Status: AC
  Administered 2019-03-03: 09:00:00 1 mg via INTRAMUSCULAR
  Filled 2019-03-03: qty 1

## 2019-03-03 NOTE — ED Triage Notes (Signed)
Pt BIB [facility], c/o spasms that are occurring approx every hour. Pt concerned it's a reaction to increasing his Sertraline from 50mg  to 100mg  x 1 week ado. Symptoms started 1.5 hours ago. Pt ambulatory from EMS stretcher to chair without difficulty.

## 2019-03-03 NOTE — ED Notes (Signed)
Pt has had no witnessed "spasms" with RN. Pt now demonstrating what his spasms look like and states "Did the PA see them this time? Can you make sure she saw them."

## 2019-03-03 NOTE — ED Provider Notes (Signed)
[place] EMERGENCY DEPARTMENT Provider Note   CSN: [id number] Arrival date & time: 03/03/19  0153     History   Chief Complaint Chief Complaint  Patient presents with   Spasms    HPI [name] is a 48 y.o. male who presents with spasms. Pt states that for the past day he's had total body shaking and twitching. He thinks it may be due to his psych meds. He takes Seroquel and Zoloft which was prescribed by his PCP. When he takes these meds he starts twitching so he stopped them 5 days ago but he continues to twitch. Of note he was seen in the ED yesterday for his chronic abdominal pain and there was no mention of twitching/shaking at that time. He states that he the shaking is uncontrollable and painful and it comes and goes.     HPI  Past Medical History:  Diagnosis Date   Alcoholism /alcohol abuse ([name])    Anemia    Anxiety    Arthritis    "knees; arms; elbows" (03/26/2015)   Asthma    Bipolar disorder ([name])    Chronic bronchitis ([name])    Chronic lower back pain    Chronic pancreatitis ([name])    Cocaine abuse ([name])    Depression    Family history of adverse reaction to anesthesia    "grandmother gets confused"   Femoral condyle fracture ([name]) 03/08/2014   left medial/notes 03/09/2014   GERD (gastroesophageal reflux disease)    H/O hiatal hernia    H/O suicide attempt 10/2012   High cholesterol    History of blood transfusion 10/2012   "when I tried to commit suicide"   History of stomach ulcers    Hypertension    Marijuana abuse, continuous    Migraine    "a few times/year" (03/26/2015)   Pneumonia 1990's X 3   PTSD (post-traumatic stress disorder)    Sickle cell trait ([name])    WPW (Wolff-Parkinson-White syndrome)    [name] 03/06/2013    Patient Active Problem List   Diagnosis Date Noted   AKI (acute kidney injury) ([name]) 11/13/2018   Seizure ([name]) 11/13/2018   Gastritis and gastroduodenitis     Chest pain 01/08/2018   GI bleed 11/24/2017   Acute blood loss anemia 11/24/2017   Atypical chest pain 11/24/2017   Acute pancreatitis 09/28/2017   Abdominal pain 05/27/2017   Hematemesis 05/27/2017   Tachycardia 03/18/2017   Diarrhea 03/18/2017   Acute on chronic pancreatitis ([place]) 12/17/2016   Intractable nausea and vomiting 12/05/2016   Verbally abusive behavior 12/05/2016   Normocytic anemia 12/05/2016   Alcohol use disorder, severe, dependence ([name]) 07/25/2016   Cocaine use disorder, severe, dependence ([name]) 07/25/2016   Major depressive disorder, 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   Leukocytosis    Hospital acquired PNA 05/20/2015   Chronic pancreatitis ([name]) 05/18/2015   Pseudocyst of pancreas 05/18/2015   Polysubstance abuse (tobacco, cocaine, THC, and ETOH) 03/26/2015   Alcohol-induced chronic pancreatitis ([name])    Benign essential HTN 02/06/2014   Alcohol-induced acute pancreatitis 11/28/2013   Pancreatic pseudocyst/cyst 11/25/2013   Severe protein-calorie malnutrition ([place]) 10/10/2013   Suicide attempt ([name]) 10/08/2013   [name] Parkinson White pattern seen on electrocardiogram 10/03/2012   TOBACCO ABUSE 03/23/2007    Past Surgical History:  Procedure Laterality Date   BIOPSY  11/25/2017   Procedure: BIOPSY;  Surgeon: Jason Silence, MD;  Location: [facility];  Service: Endoscopy;;   BIOPSY  10/14/2018  Procedure: BIOPSY;  Surgeon: Jason Silence, MD;  Location: [facility];  Service: Endoscopy;;   CARDIAC CATHETERIZATION     ESOPHAGOGASTRODUODENOSCOPY (EGD) WITH PROPOFOL N/A 11/25/2017   Procedure: ESOPHAGOGASTRODUODENOSCOPY (EGD) WITH PROPOFOL;  Surgeon: Jason Silence, MD;  Location: [facility] ENDOSCOPY;  Service: Endoscopy;  Laterality: N/A;   ESOPHAGOGASTRODUODENOSCOPY (EGD) WITH PROPOFOL Left 10/14/2018   Procedure: ESOPHAGOGASTRODUODENOSCOPY (EGD) WITH PROPOFOL;  Surgeon: Jason Silence, MD;  Location: [facility]  ENDOSCOPY;  Service: Endoscopy;  Laterality: Left;   ESOPHAGOGASTRODUODENOSCOPY (EGD) WITH PROPOFOL N/A 11/14/2018   Procedure: ESOPHAGOGASTRODUODENOSCOPY (EGD) WITH PROPOFOL;  Surgeon: Jason Spates, MD;  Location: [facility] ENDOSCOPY;  Service: Gastroenterology;  Laterality: N/A;   EYE SURGERY Left 1990's   "result of trauma"    FACIAL FRACTURE SURGERY Left 1990's   "result of trauma"    FRACTURE SURGERY     HERNIA REPAIR     LEFT HEART CATHETERIZATION WITH CORONARY ANGIOGRAM Right 03/07/2013   Procedure: LEFT HEART CATHETERIZATION WITH CORONARY ANGIOGRAM;  Surgeon: Jason Riddle, MD;  Location: [place] CATH LAB;  Service: Cardiovascular;  Laterality: Right;   UMBILICAL HERNIA REPAIR          Home Medications    Prior to Admission medications   Medication Sig Start Date End Date Taking? Authorizing Provider  acetaminophen (TYLENOL) 500 MG tablet Take 1,000 mg by mouth every 6 (six) hours as needed for mild pain.    [provider]  amitriptyline (ELAVIL) 25 MG tablet Take 1 tablet (25 mg total) by mouth at bedtime. 10/15/18   [name], MD  azithromycin (ZITHROMAX) 250 MG tablet Take 1 tablet (250 mg total) by mouth daily. Take first 2 tablets together, then 1 every day until finished. Patient not taking: Reported on 03/02/2019 12/26/18   Jason Circle, PA-C  Cyanocobalamin (VITAMIN B-12 PO) Take 1 tablet by mouth daily.    [provider]  folic acid (FOLVITE) 1 MG tablet Take 1 tablet (1 mg total) by mouth daily. 11/26/17   Jason Lose, MD  gabapentin (NEURONTIN) 100 MG capsule Take 1 capsule (100 mg total) by mouth 2 (two) times daily. 10/15/18   [name], MD  hydrALAZINE (APRESOLINE) 25 MG tablet Take 25 mg by mouth daily.    [provider]  lipase/protease/amylase (CREON) 12000 units CPEP capsule Take 1 capsule (12,000 Units total) by mouth 3 (three) times daily with meals. For pancreatitis 10/15/18   [name], MD  loratadine (CLARITIN)  10 MG tablet Take 1 tablet (10 mg total) by mouth daily as needed for allergies or rhinitis. (May purchase from over the counter): For allergies Patient taking differently: Take 10 mg by mouth daily. (May purchase from over the counter): For allergies 10/01/17   Aline August, MD  LORazepam (ATIVAN) 0.5 MG tablet Take 0.5 mg by mouth every 8 (eight) hours as needed for anxiety.    [provider]  metoprolol tartrate (LOPRESSOR) 100 MG tablet Take 100 mg by mouth 2 (two) times daily.     [provider]  Multiple Vitamin (MULTIVITAMIN WITH MINERALS) TABS tablet Take 1 tablet by mouth daily. 09/06/17   Bonnell Public, MD  nicotine (NICODERM CQ - DOSED IN MG/24 HOURS) 21 mg/24hr patch Place 1 patch (21 mg total) onto the skin daily. Patient not taking: Reported on 03/02/2019 10/16/18   [name], MD  nicotine polacrilex (NICORETTE) 4 MG gum Take 4 mg by mouth daily as needed for smoking cessation.    [provider]  ondansetron (ZOFRAN) 4 MG tablet Take 1 tablet (4 mg total) by mouth every 8 (eight) hours as needed for nausea or vomiting. 11/24/18   Nuala Alpha A, PA-C  oxyCODONE-acetaminophen (PERCOCET/ROXICET) 5-325 MG tablet Take 1 tablet by mouth every 8 (eight) hours as needed for severe pain.    [provider]  pantoprazole (PROTONIX) 40 MG tablet Take 40 mg by mouth daily.    [provider]  potassium chloride SA (K-DUR) 20 MEQ tablet Take 1 tablet (20 mEq total) by mouth daily. 01/07/19   Sherwood Gambler, MD  promethazine (PHENERGAN) 25 MG tablet Take 1 tablet (25 mg total) by mouth every 6 (six) hours as needed for nausea or vomiting. 03/02/19   Kinnie Feil, PA-C  QUEtiapine (SEROQUEL) 100 MG tablet Take 100 mg by mouth at bedtime.    [provider]  sertraline (ZOLOFT) 100 MG tablet Take 1 tablet (100 mg total) by mouth daily. Patient taking differently: Take 150 mg by mouth daily.  08/29/17   Clent Demark, PA-C    sucralfate (CARAFATE) 1 g tablet Take 1 tablet (1 g total) by mouth 4 (four) times daily -  with meals and at bedtime. 12/26/18   Jason Circle, PA-C  thiamine 100 MG tablet Take 1 tablet (100 mg total) by mouth daily. 10/16/18   [name], MD    Family History Family History  Problem Relation Age of Onset   Hypertension Mother    Cirrhosis Mother    Alcoholism Mother    Hypertension Father    Melanoma Father    Hypertension Other    Coronary artery disease Other     Social History Social History   Tobacco Use   Smoking status: Current Every Day Smoker    Packs/day: 1.00    Years: 33.00    Pack years: 33.00    Types: Cigarettes, E-cigarettes   Smokeless tobacco: Never Used  Substance Use Topics   Alcohol use: Yes   Drug use: Yes    Types: Marijuana, Cocaine    Comment: daily marijuana use; last cocaine use about 3 months ago     Allergies   Robaxin [methocarbamol], Shellfish-derived products, Trazodone, Trazodone and nefazodone, Adhesive [tape], Latex, Toradol [ketorolac tromethamine], Contrast media [iodinated diagnostic agents], and Reglan [metoclopramide]   Review of Systems Review of Systems  Gastrointestinal: Positive for abdominal pain.  Musculoskeletal: Positive for myalgias.  Neurological: Positive for tremors (shaking). Negative for seizures.  Psychiatric/Behavioral: Positive for behavioral problems. The patient is nervous/anxious.   All other systems reviewed and are negative.    Physical Exam Updated Vital Signs BP 124/71    Pulse 100    Temp 98 F (36.7 C) (Oral)    Resp 18    SpO2 98%   Physical Exam Vitals signs and nursing note reviewed.  Constitutional:      General: He is not in acute distress.    Appearance: Normal appearance. He is well-developed. He is not ill-appearing.  HENT:     Head: Normocephalic and atraumatic.  Eyes:     General: No scleral icterus.       Right eye: No discharge.        Left eye: No  discharge.     Conjunctiva/sclera: Conjunctivae normal.     Pupils: Pupils are equal, round, and reactive to light.  Neck:     Musculoskeletal: Normal range of motion.  Cardiovascular:     Rate and Rhythm: Normal rate and regular rhythm.  Pulmonary:  Effort: Pulmonary effort is normal. No respiratory distress.     Breath sounds: Normal breath sounds.  Abdominal:     General: There is no distension.     Palpations: Abdomen is soft.     Tenderness: There is no abdominal tenderness.  Skin:    General: Skin is warm and dry.  Neurological:     Mental Status: He is alert and oriented to person, place, and time.     Comments: On initial exam patient has no twitching. Throughout his ED stay he exhibits more twitching, particularly when someone is watching him but tends to stop when he thinks he's not being observed  Psychiatric:        Mood and Affect: Mood is anxious.        Speech: Speech normal.        Behavior: Behavior normal.        Judgment: Judgment is inappropriate.      ED Treatments / Results  Labs (all labs ordered are listed, but only abnormal results are displayed) Labs Reviewed - No data to display  EKG None  Radiology Dg Chest 2 View  Result Date: 03/02/2019 CLINICAL DATA:  Chest pain EXAM: CHEST - 2 VIEW COMPARISON:  12/25/2018 chest radiograph. FINDINGS: Stable cardiomediastinal silhouette with normal heart size. No pneumothorax. No pleural effusion. Lungs appear clear, with no acute consolidative airspace disease and no pulmonary edema. Subacute healing versus chronic fractures in lateral aspect of right seventh and eighth ribs. IMPRESSION: 1. No active cardiopulmonary disease. 2. Subacute healing versus chronic fractures in lateral aspect of right seventh and eighth ribs. Electronically Signed   By: Ilona Sorrel M.D.   On: 03/02/2019 10:06    Procedures Procedures (including critical care time)  Medications Ordered in ED Medications  LORazepam (ATIVAN)  injection 1 mg (1 mg Intramuscular Given 03/03/19 0906)  alum & mag hydroxide-simeth (MAALOX/MYLANTA) 200-200-20 MG/5ML suspension 30 mL (30 mLs Oral Given 03/03/19 0906)  lidocaine (XYLOCAINE) 2 % viscous mouth solution 15 mL (15 mLs Mouth/Throat Given 03/03/19 0906)  LORazepam (ATIVAN) injection 1 mg (1 mg Intramuscular Given 03/03/19 0955)     Initial Impression / Assessment and Plan / ED Course  I have reviewed the triage vital signs and the nursing notes.  Pertinent labs & imaging results that were available during my care of the patient were reviewed by me and considered in my medical decision making (see chart for details).  48 year old with shaking. Possibly medication related vs psych disorder vs secondary gain. He is hypertensive and mildly tachycardic. Will administer Ativan IM. He is also complaining of "burning pain" in his abdomen and requests a GI cocktail.  9:45 AM Pt was seen outside his room stumbling in the hall. He states "I think I'm having a seizure" and starts jerking his arms. Provided reassurance that he was not having a seizure. He then states "well why am I shaking?". I advised him to come back to his room and please stay seated. Will another dose of IM Ativan.  9:51 AM Of note patient was observed walking to the bathroom multiple times without any difficulty  I went in to talk to the patient and he appears to be having a dystonic reaction vs he is having voluntary jerking of his extremities. He has received 2mg  IM Ativan. He is now asking for Ketamine. I declined. Will give rx for Flexeril. Advised stop Seroquel and Zoloft and f/u with Marliss Coots  Final Clinical Impressions(s) / ED  Diagnoses   Final diagnoses:  Episode of shaking    ED Discharge Orders    None       Recardo Evangelist, PA-C 03/03/19 1740    Maudie Flakes, MD 03/05/19 [phone]

## 2019-03-24 ENCOUNTER — Emergency Department (HOSPITAL_COMMUNITY)
Admission: EM | Admit: 2019-03-24 | Discharge: 2019-03-24 | Disposition: A | Discharge status: Home / Self Care | Payer: Self-pay | Attending: Emergency Medicine | Admitting: Emergency Medicine

## 2019-03-24 ENCOUNTER — Encounter (HOSPITAL_COMMUNITY): Payer: Self-pay | Admitting: Emergency Medicine

## 2019-03-24 ENCOUNTER — Other Ambulatory Visit: Payer: Self-pay

## 2019-03-24 DIAGNOSIS — F17210 Nicotine dependence, cigarettes, uncomplicated: Secondary | ICD-10-CM | POA: Insufficient documentation

## 2019-03-24 DIAGNOSIS — K625 Hemorrhage of anus and rectum: Secondary | ICD-10-CM

## 2019-03-24 DIAGNOSIS — Z91040 Latex allergy status: Secondary | ICD-10-CM | POA: Insufficient documentation

## 2019-03-24 DIAGNOSIS — K2970 Gastritis, unspecified, without bleeding: Secondary | ICD-10-CM | POA: Insufficient documentation

## 2019-03-24 DIAGNOSIS — I10 Essential (primary) hypertension: Secondary | ICD-10-CM | POA: Insufficient documentation

## 2019-03-24 DIAGNOSIS — Z79899 Other long term (current) drug therapy: Secondary | ICD-10-CM | POA: Insufficient documentation

## 2019-03-24 DIAGNOSIS — J45909 Unspecified asthma, uncomplicated: Secondary | ICD-10-CM | POA: Insufficient documentation

## 2019-03-24 LAB — CBC WITH DIFFERENTIAL/PLATELET
Abs Immature Granulocytes: 0.04 10*3/uL (ref 0.00–0.07)
Basophils Absolute: 0 10*3/uL (ref 0.0–0.1)
Basophils Relative: 0 %
Eosinophils Absolute: 0.1 10*3/uL (ref 0.0–0.5)
Eosinophils Relative: 2 %
HCT: 42.1 % (ref 39.0–52.0)
Hemoglobin: 13.2 g/dL (ref 13.0–17.0)
Immature Granulocytes: 1 %
Lymphocytes Relative: 31 %
Lymphs Abs: 2.1 10*3/uL (ref 0.7–4.0)
MCH: 32.4 pg (ref 26.0–34.0)
MCHC: 31.4 g/dL (ref 30.0–36.0)
MCV: 103.2 fL — ABNORMAL HIGH (ref 80.0–100.0)
Monocytes Absolute: 0.8 10*3/uL (ref 0.1–1.0)
Monocytes Relative: 12 %
Neutro Abs: 3.7 10*3/uL (ref 1.7–7.7)
Neutrophils Relative %: 54 %
Platelets: 225 10*3/uL (ref 150–400)
RBC: 4.08 MIL/uL — ABNORMAL LOW (ref 4.22–5.81)
RDW: 17.2 % — ABNORMAL HIGH (ref 11.5–15.5)
WBC: 6.8 10*3/uL (ref 4.0–10.5)
nRBC: 0 % (ref 0.0–0.2)

## 2019-03-24 LAB — COMPREHENSIVE METABOLIC PANEL
ALT: 67 U/L — ABNORMAL HIGH (ref 0–44)
AST: 124 U/L — ABNORMAL HIGH (ref 15–41)
Albumin: 4.1 g/dL (ref 3.5–5.0)
Alkaline Phosphatase: 88 U/L (ref 38–126)
Anion gap: 15 (ref 5–15)
BUN: <5 mg/dL — ABNORMAL LOW (ref 6–20)
CO2: 17 mmol/L — ABNORMAL LOW (ref 22–32)
Calcium: 9 mg/dL (ref 8.9–10.3)
Chloride: 103 mmol/L (ref 98–111)
Creatinine, Ser: 0.78 mg/dL (ref 0.61–1.24)
GFR calc Af Amer: >60 mL/min (ref >60)
GFR calc non Af Amer: >60 mL/min (ref >60)
Glucose, Bld: 112 mg/dL — ABNORMAL HIGH (ref 70–99)
Potassium: 3.4 mmol/L — ABNORMAL LOW (ref 3.5–5.1)
Sodium: 135 mmol/L (ref 135–145)
Total Bilirubin: 0.8 mg/dL (ref 0.3–1.2)
Total Protein: 7.8 g/dL (ref 6.5–8.1)

## 2019-03-24 LAB — TYPE AND SCREEN
ABO/RH(D): B POS
Antibody Screen: NEGATIVE

## 2019-03-24 LAB — LIPASE, BLOOD: Lipase: 24 U/L (ref 11–51)

## 2019-03-24 MED ORDER — PANTOPRAZOLE SODIUM 40 MG IV SOLR
40.0000 mg | Freq: Once | INTRAVENOUS | Status: AC
  Administered 2019-03-24: 12:00:00 40 mg via INTRAVENOUS
  Filled 2019-03-24: qty 40

## 2019-03-24 MED ORDER — SODIUM CHLORIDE 0.9 % IV BOLUS
500.0000 mL | Freq: Once | INTRAVENOUS | Status: AC
  Administered 2019-03-24: 13:00:00 500 mL via INTRAVENOUS

## 2019-03-24 MED ORDER — ALUM & MAG HYDROXIDE-SIMETH 200-200-20 MG/5ML PO SUSP
30.0000 mL | Freq: Once | ORAL | Status: AC
  Administered 2019-03-24: 15:00:00 30 mL via ORAL
  Filled 2019-03-24: qty 30

## 2019-03-24 MED ORDER — LORAZEPAM 2 MG/ML IJ SOLN
1.0000 mg | Freq: Once | INTRAMUSCULAR | Status: AC
  Administered 2019-03-24: 15:00:00 1 mg via INTRAVENOUS
  Filled 2019-03-24: qty 1

## 2019-03-24 MED ORDER — ALUM & MAG HYDROXIDE-SIMETH 200-200-20 MG/5ML PO SUSP
30.0000 mL | Freq: Once | ORAL | Status: AC
  Administered 2019-03-24: 12:00:00 30 mL via ORAL
  Filled 2019-03-24: qty 30

## 2019-03-24 MED ORDER — LIDOCAINE VISCOUS HCL 2 % MT SOLN
15.0000 mL | Freq: Once | OROMUCOSAL | Status: AC
  Administered 2019-03-24: 15 mL via ORAL
  Filled 2019-03-24: qty 15

## 2019-03-24 MED ORDER — LIDOCAINE VISCOUS HCL 2 % MT SOLN
15.0000 mL | Freq: Once | OROMUCOSAL | Status: AC
  Administered 2019-03-24: 15:00:00 15 mL via ORAL
  Filled 2019-03-24: qty 15

## 2019-03-24 MED ORDER — LORAZEPAM 2 MG/ML IJ SOLN
1.0000 mg | Freq: Once | INTRAMUSCULAR | Status: AC
  Administered 2019-03-24: 1 mg via INTRAVENOUS
  Filled 2019-03-24: qty 1

## 2019-03-24 MED ORDER — SODIUM CHLORIDE 0.9 % IV BOLUS
1000.0000 mL | Freq: Once | INTRAVENOUS | Status: AC
  Administered 2019-03-24: 1000 mL via INTRAVENOUS

## 2019-03-24 MED ORDER — PANTOPRAZOLE SODIUM 40 MG PO TBEC: 40.0000 mg | DELAYED_RELEASE_TABLET | Freq: Every day | ORAL | 0 refills | Status: DC

## 2019-03-24 NOTE — Discharge Instructions (Signed)
Return to the emergency room if you have any ongoing or worsening rectal bleeding.  Follow-up with your primary care doctor for recheck.

## 2019-03-24 NOTE — ED Triage Notes (Signed)
Pt to triage via [facility]> reports bright red rectal bleeding, lower abd pain, and nausea that started this morning.

## 2019-03-24 NOTE — ED Notes (Signed)
Patient verbalizes understanding of discharge instructions . Opportunity for questions and answers were provided . Armband removed by staff ,Pt discharged from ED. W/C  offered at D/C  and Declined W/C at D/C and was escorted to lobby by RN.  

## 2019-03-24 NOTE — ED Notes (Signed)
NEG - POC Occult Blood

## 2019-03-24 NOTE — ED Provider Notes (Signed)
[facility] EMERGENCY DEPARTMENT Provider Note   CSN: [id number] Arrival date & time: 03/24/19  1107     History   Chief Complaint Chief Complaint  Patient presents with   Abdominal Pain   Rectal Bleeding    HPI [name] is a 48 y.o. male.     Patient is a 48 year old male with a history of alcohol abuse, bipolar disorder, polysubstance abuse, chronic abdominal pain, WPW, sickle cell trait and GERD who presents with rectal bleeding and abdominal pain.  He states his last alcoholic drink was about 2 days ago.  He does say he is feeling a little bit shaky which he thinks is from not drinking.  He ate a fish sandwich yesterday and was feeling okay but this morning woke up with abdominal pain across his upper abdomen which he says is similar to his prior abdominal pain.  He also noted some bright red blood per rectum.  He has had several episodes of bright red rectal bleeding.  Its mostly blood but mixed with a little bit of stool.  Feels a little bit dizzy and lightheaded.  No shortness of breath.  He does report some chest pain which he says is radiating from his abdominal pain.which he says is radiating from his abdominal pain.  Chart Review:  EGD 10/2018 non-bleeding gastric ulcer, admitted for hematemesis EDG 11/14/18 no acute findings, no varices, admitted for hematemesis EGD 11/25/2017 PUD, was admitted for maroon colored stool and hematemesis, Hgb dropped into 5s, received blood transfusion      Past Medical History:  Diagnosis Date   Alcoholism /alcohol abuse ([place])    Anemia    Anxiety    Arthritis    "knees; arms; elbows" (03/26/2015)   Asthma    Bipolar disorder (HCC)    Chronic bronchitis (HCC)    Chronic lower back pain    Chronic pancreatitis ([place])    Cocaine abuse ([place])    Depression    Family history of adverse reaction to anesthesia    "grandmother gets confused"   Femoral condyle fracture ([place]) 03/08/2014   left medial/notes 03/09/2014   GERD (gastroesophageal reflux disease)    H/O hiatal hernia    H/O suicide attempt 10/2012   High cholesterol    History of blood transfusion 10/2012   "when I tried to commit suicide"   History of stomach ulcers    Hypertension    Marijuana abuse, continuous    Migraine    "a few times/year" (03/26/2015)   Pneumonia 1990's X 3   PTSD (post-traumatic stress disorder)    Sickle cell trait ([place])    WPW (Wolff-Parkinson-White syndrome)    [name] 03/06/2013    Patient Active Problem List   Diagnosis Date Noted   AKI (acute kidney injury) ([place]) 11/13/2018   Seizure ([place]) 11/13/2018   Gastritis and gastroduodenitis    Chest pain 01/08/2018   GI bleed 11/24/2017   Acute blood loss anemia 11/24/2017   Atypical chest pain 11/24/2017   Acute pancreatitis 09/28/2017   Abdominal pain 05/27/2017   Hematemesis 05/27/2017   Tachycardia 03/18/2017   Diarrhea 03/18/2017   Acute on chronic pancreatitis ([place]) 12/17/2016   Intractable nausea and vomiting 12/05/2016   Verbally abusive behavior 12/05/2016   Normocytic anemia 12/05/2016   Alcohol use disorder, severe, dependence ([place]) 07/25/2016   Cocaine use disorder, severe, dependence ([place]) 07/25/2016   Major depressive disorder, 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   Leukocytosis    Hospital  acquired PNA 05/20/2015   Chronic pancreatitis ([place]) 05/18/2015   Pseudocyst of pancreas 05/18/2015   Polysubstance abuse (tobacco, cocaine, THC, and ETOH) 03/26/2015   Alcohol-induced chronic pancreatitis ([place])    Benign essential HTN 02/06/2014   Alcohol-induced acute pancreatitis 11/28/2013   Pancreatic pseudocyst/cyst 11/25/2013   Severe protein-calorie malnutrition ([place]) 10/10/2013   Suicide attempt ([place]) 10/08/2013   [name] Parkinson White pattern seen on electrocardiogram 10/03/2012   TOBACCO ABUSE 03/23/2007    Past Surgical History:    Procedure Laterality Date   BIOPSY  11/25/2017   Procedure: BIOPSY;  Surgeon: Arta Silence, MD;  Location: [facility] ENDOSCOPY;  Service: Endoscopy;;   BIOPSY  10/14/2018   Procedure: BIOPSY;  Surgeon: Arta Silence, MD;  Location: [facility];  Service: Endoscopy;;   CARDIAC CATHETERIZATION     ESOPHAGOGASTRODUODENOSCOPY (EGD) WITH PROPOFOL N/A 11/25/2017   Procedure: ESOPHAGOGASTRODUODENOSCOPY (EGD) WITH PROPOFOL;  Surgeon: Arta Silence, MD;  Location: [facility] ENDOSCOPY;  Service: Endoscopy;  Laterality: N/A;   ESOPHAGOGASTRODUODENOSCOPY (EGD) WITH PROPOFOL Left 10/14/2018   Procedure: ESOPHAGOGASTRODUODENOSCOPY (EGD) WITH PROPOFOL;  Surgeon: Arta Silence, MD;  Location: [facility] ENDOSCOPY;  Service: Endoscopy;  Laterality: Left;   ESOPHAGOGASTRODUODENOSCOPY (EGD) WITH PROPOFOL N/A 11/14/2018   Procedure: ESOPHAGOGASTRODUODENOSCOPY (EGD) WITH PROPOFOL;  Surgeon: Laurence Spates, MD;  Location: [facility] ENDOSCOPY;  Service: Gastroenterology;  Laterality: N/A;   EYE SURGERY Left 1990's   "result of trauma"    FACIAL FRACTURE SURGERY Left 1990's   "result of trauma"    FRACTURE SURGERY     HERNIA REPAIR     LEFT HEART CATHETERIZATION WITH CORONARY ANGIOGRAM Right 03/07/2013   Procedure: LEFT HEART CATHETERIZATION WITH CORONARY ANGIOGRAM;  Surgeon: [name], MD;  Location: [facility] CATH LAB;  Service: Cardiovascular;  Laterality: Right;   UMBILICAL HERNIA REPAIR          Home Medications    Prior to Admission medications   Medication Sig Start Date End Date Taking? Authorizing Provider  acetaminophen (TYLENOL) 500 MG tablet Take 1,000 mg by mouth every 6 (six) hours as needed for mild pain.    [provider]  amitriptyline (ELAVIL) 25 MG tablet Take 1 tablet (25 mg total) by mouth at bedtime. 10/15/18   [name], MD  azithromycin (ZITHROMAX) 250 MG tablet Take 1 tablet (250 mg total) by mouth daily. Take first 2 tablets together, then 1 every day until finished. Patient not  taking: Reported on 03/02/2019 12/26/18   Montine Circle, PA-C  Cyanocobalamin (VITAMIN B-12 PO) Take 1 tablet by mouth daily.    [provider]  cyclobenzaprine (FLEXERIL) 10 MG tablet Take 1 tablet (10 mg total) by mouth 2 (two) times daily as needed for muscle spasms. 03/03/19   Recardo Evangelist, PA-C  folic acid (FOLVITE) 1 MG tablet Take 1 tablet (1 mg total) by mouth daily. 11/26/17   [name], MD  gabapentin (NEURONTIN) 100 MG capsule Take 1 capsule (100 mg total) by mouth 2 (two) times daily. 10/15/18   [name], MD  hydrALAZINE (APRESOLINE) 25 MG tablet Take 25 mg by mouth daily.    [provider]  lipase/protease/amylase (CREON) 12000 units CPEP capsule Take 1 capsule (12,000 Units total) by mouth 3 (three) times daily with meals. For pancreatitis 10/15/18   [name], MD  loratadine (CLARITIN) 10 MG tablet Take 1 tablet (10 mg total) by mouth daily as needed for allergies or rhinitis. (May purchase from over the counter): For allergies Patient taking differently: Take 10 mg  by mouth daily. (May purchase from over the counter): For allergies 10/01/17   Aline August, MD  LORazepam (ATIVAN) 0.5 MG tablet Take 0.5 mg by mouth every 8 (eight) hours as needed for anxiety.    [provider]  metoprolol tartrate (LOPRESSOR) 100 MG tablet Take 100 mg by mouth 2 (two) times daily.     [provider]  Multiple Vitamin (MULTIVITAMIN WITH MINERALS) TABS tablet Take 1 tablet by mouth daily. 09/06/17   Bonnell Public, MD  nicotine (NICODERM CQ - DOSED IN MG/24 HOURS) 21 mg/24hr patch Place 1 patch (21 mg total) onto the skin daily. Patient not taking: Reported on 03/02/2019 10/16/18   [name], MD  nicotine polacrilex (NICORETTE) 4 MG gum Take 4 mg by mouth daily as needed for smoking cessation.    [provider]  ondansetron (ZOFRAN) 4 MG tablet Take 1 tablet (4 mg total) by mouth every 8 (eight) hours as needed for nausea or  vomiting. 11/24/18   Nuala Alpha A, PA-C  oxyCODONE-acetaminophen (PERCOCET/ROXICET) 5-325 MG tablet Take 1 tablet by mouth every 8 (eight) hours as needed for severe pain.    [provider]  pantoprazole (PROTONIX) 40 MG tablet Take 1 tablet (40 mg total) by mouth daily. 03/24/19   Malvin Johns, MD  potassium chloride SA (K-DUR) 20 MEQ tablet Take 1 tablet (20 mEq total) by mouth daily. 01/07/19   Sherwood Gambler, MD  promethazine (PHENERGAN) 25 MG tablet Take 1 tablet (25 mg total) by mouth every 6 (six) hours as needed for nausea or vomiting. 03/02/19   Kinnie Feil, PA-C  QUEtiapine (SEROQUEL) 100 MG tablet Take 100 mg by mouth at bedtime.    [provider]  sertraline (ZOLOFT) 100 MG tablet Take 1 tablet (100 mg total) by mouth daily. Patient taking differently: Take 150 mg by mouth daily.  08/29/17   Clent Demark, PA-C  sucralfate (CARAFATE) 1 g tablet Take 1 tablet (1 g total) by mouth 4 (four) times daily -  with meals and at bedtime. 12/26/18   Montine Circle, PA-C  thiamine 100 MG tablet Take 1 tablet (100 mg total) by mouth daily. 10/16/18   [name], MD    Family History Family History  Problem Relation Age of Onset   Hypertension Mother    Cirrhosis Mother    Alcoholism Mother    Hypertension Father    Melanoma Father    Hypertension Other    Coronary artery disease Other     Social History Social History   Tobacco Use   Smoking status: Current Every Day Smoker    Packs/day: 1.00    Years: 33.00    Pack years: 33.00    Types: Cigarettes, E-cigarettes   Smokeless tobacco: Never Used  Substance Use Topics   Alcohol use: Yes   Drug use: Yes    Types: Marijuana, Cocaine    Comment: daily marijuana use; last cocaine use about 3 months ago     Allergies   Robaxin [methocarbamol], Shellfish-derived products, Trazodone, Trazodone and nefazodone, Adhesive [tape], Latex, Toradol [ketorolac tromethamine], Contrast media  [iodinated diagnostic agents], and Reglan [metoclopramide]   Review of Systems Review of Systems  Constitutional: Negative for chills, diaphoresis, fatigue and fever.  HENT: Negative for congestion, rhinorrhea and sneezing.   Eyes: Negative.   Respiratory: Negative for cough, chest tightness and shortness of breath.   Cardiovascular: Positive for chest pain. Negative for leg swelling.  Gastrointestinal: Positive for abdominal pain,  blood in stool and nausea. Negative for diarrhea and vomiting.  Genitourinary: Negative for difficulty urinating, flank pain, frequency and hematuria.  Musculoskeletal: Negative for arthralgias and back pain.  Skin: Negative for rash.  Neurological: Positive for light-headedness. Negative for dizziness, speech difficulty, weakness, numbness and headaches.     Physical Exam Updated Vital Signs BP (!) 142/91 (BP Location: Right Arm)    Pulse (!) 120    Temp 98.4 F (36.9 C) (Oral)    Resp 14    Ht 5\' 9"  (1.753 m)    Wt 61.2 kg    SpO2 98%    BMI 19.94 kg/m   Physical Exam Constitutional:      Appearance: He is well-developed.  HENT:     Head: Normocephalic and atraumatic.  Eyes:     Pupils: Pupils are equal, round, and reactive to light.  Neck:     Musculoskeletal: Normal range of motion and neck supple.  Cardiovascular:     Rate and Rhythm: Normal rate and regular rhythm.     Heart sounds: Normal heart sounds.  Pulmonary:     Effort: Pulmonary effort is normal. No respiratory distress.     Breath sounds: Normal breath sounds. No wheezing or rales.  Chest:     Chest wall: No tenderness.  Abdominal:     General: Bowel sounds are normal.     Palpations: Abdomen is soft.     Tenderness: There is no abdominal tenderness. There is no guarding or rebound.  Genitourinary:    Comments: Positive for maroon-colored blood on rectal exam Musculoskeletal: Normal range of motion.  Lymphadenopathy:     Cervical: No cervical adenopathy.  Skin:    General:  Skin is warm and dry.     Findings: No rash.  Neurological:     Mental Status: He is alert and oriented to person, place, and time.      ED Treatments / Results  Labs (all labs ordered are listed, but only abnormal results are displayed) Labs Reviewed  COMPREHENSIVE METABOLIC PANEL - Abnormal; Notable for the following components:      Result Value   Potassium 3.4 (*)    CO2 17 (*)    Glucose, Bld 112 (*)    BUN <5 (*)    AST 124 (*)    ALT 67 (*)    All other components within normal limits  CBC WITH DIFFERENTIAL/PLATELET - Abnormal; Notable for the following components:   RBC 4.08 (*)    MCV 103.2 (*)    RDW 17.2 (*)    All other components within normal limits  LIPASE, BLOOD  POC OCCULT BLOOD, ED  TYPE AND SCREEN    EKG EKG Interpretation  Date/Time:  Sunday March 24 2019 12:07:52 EST Ventricular Rate:  98 PR Interval:    QRS Duration: 79 QT Interval:  306 QTC Calculation: 391 R Axis:   70 Text Interpretation: Sinus rhythm Short PR interval Biatrial enlargement Abnormal T, consider ischemia, diffuse leads since last tracing no significant change Confirmed by Malvin Johns [id number]) on 03/24/2019 12:10:06 PM   Radiology No results found.  Procedures Procedures (including critical care time)  Medications Ordered in ED Medications  alum & mag hydroxide-simeth (MAALOX/MYLANTA) 200-200-20 MG/5ML suspension 30 mL (has no administration in time range)    And  lidocaine (XYLOCAINE) 2 % viscous mouth solution 15 mL (has no administration in time range)  LORazepam (ATIVAN) injection 1 mg (1 mg Intravenous Given 03/24/19 1221)  pantoprazole (PROTONIX) injection 40  mg (40 mg Intravenous Given 03/24/19 1221)  alum & mag hydroxide-simeth (MAALOX/MYLANTA) 200-200-20 MG/5ML suspension 30 mL (30 mLs Oral Given 03/24/19 1221)    And  lidocaine (XYLOCAINE) 2 % viscous mouth solution 15 mL (15 mLs Oral Given 03/24/19 1221)  sodium chloride 0.9 % bolus 500 mL (0 mLs  Intravenous Stopped 03/24/19 1441)  sodium chloride 0.9 % bolus 1,000 mL (1,000 mLs Intravenous New Bag/Given 03/24/19 1446)  LORazepam (ATIVAN) injection 1 mg (1 mg Intravenous Given 03/24/19 1446)     Initial Impression / Assessment and Plan / ED Course  I have reviewed the triage vital signs and the nursing notes.  Pertinent labs & imaging results that were available during my care of the patient were reviewed by me and considered in my medical decision making (see chart for details).        Patient is a 48 year old male who has chronic alcohol abuse and chronic abdominal pain.  He presents with reports of rectal bleeding.  On my exam he seemed to have some maroon-colored stool.  Surprisingly, the Hemoccult was negative.  I asked him if he been eating anything that was red-colored and he said he did have some Pepto-Bismol which I did not feel was very likely to cause a stool to be red.  His hemoglobin is stable.  He was initially tachycardic on arrival but he seemed to also be having some mild alcohol withdrawal.  He was given some Ativan and he feels much better.  He is completely alert and oriented.  He sitting up appears to be very comfortable and is eating a sandwich.  He has had no vomiting.  No ongoing rectal bleeding.  He does not appear to be in acute withdrawal.  His heart rate has normalized and now is in the 90s.  His other vital signs are stable.  He was discharged home in good condition.  He was advised to follow-up with his PCP.  Return precautions were given.  He was given a refill on his Protonix.  Final Clinical Impressions(s) / ED Diagnoses   Final diagnoses:  Rectal bleeding  Gastritis without bleeding, unspecified chronicity, unspecified gastritis type    ED Discharge Orders         Ordered    pantoprazole (PROTONIX) 40 MG tablet  Daily     03/24/19 1501           Malvin Johns, MD 03/24/19 1503

## 2019-03-25 LAB — POC OCCULT BLOOD, ED: Fecal Occult Bld: NEGATIVE

## 2019-05-15 IMAGING — CR DG CHEST 2V
2 series · 2 of 2 positions shown · non-contrast
Comparison: July 03, 2016 and February 20, 2016 chest
radiographs; November 26, 2014 chest CT

CLINICAL DATA: Pancreatitis.  Chest pain and shortness of breath

EXAM:
CHEST  2 VIEW

[w chest pa]
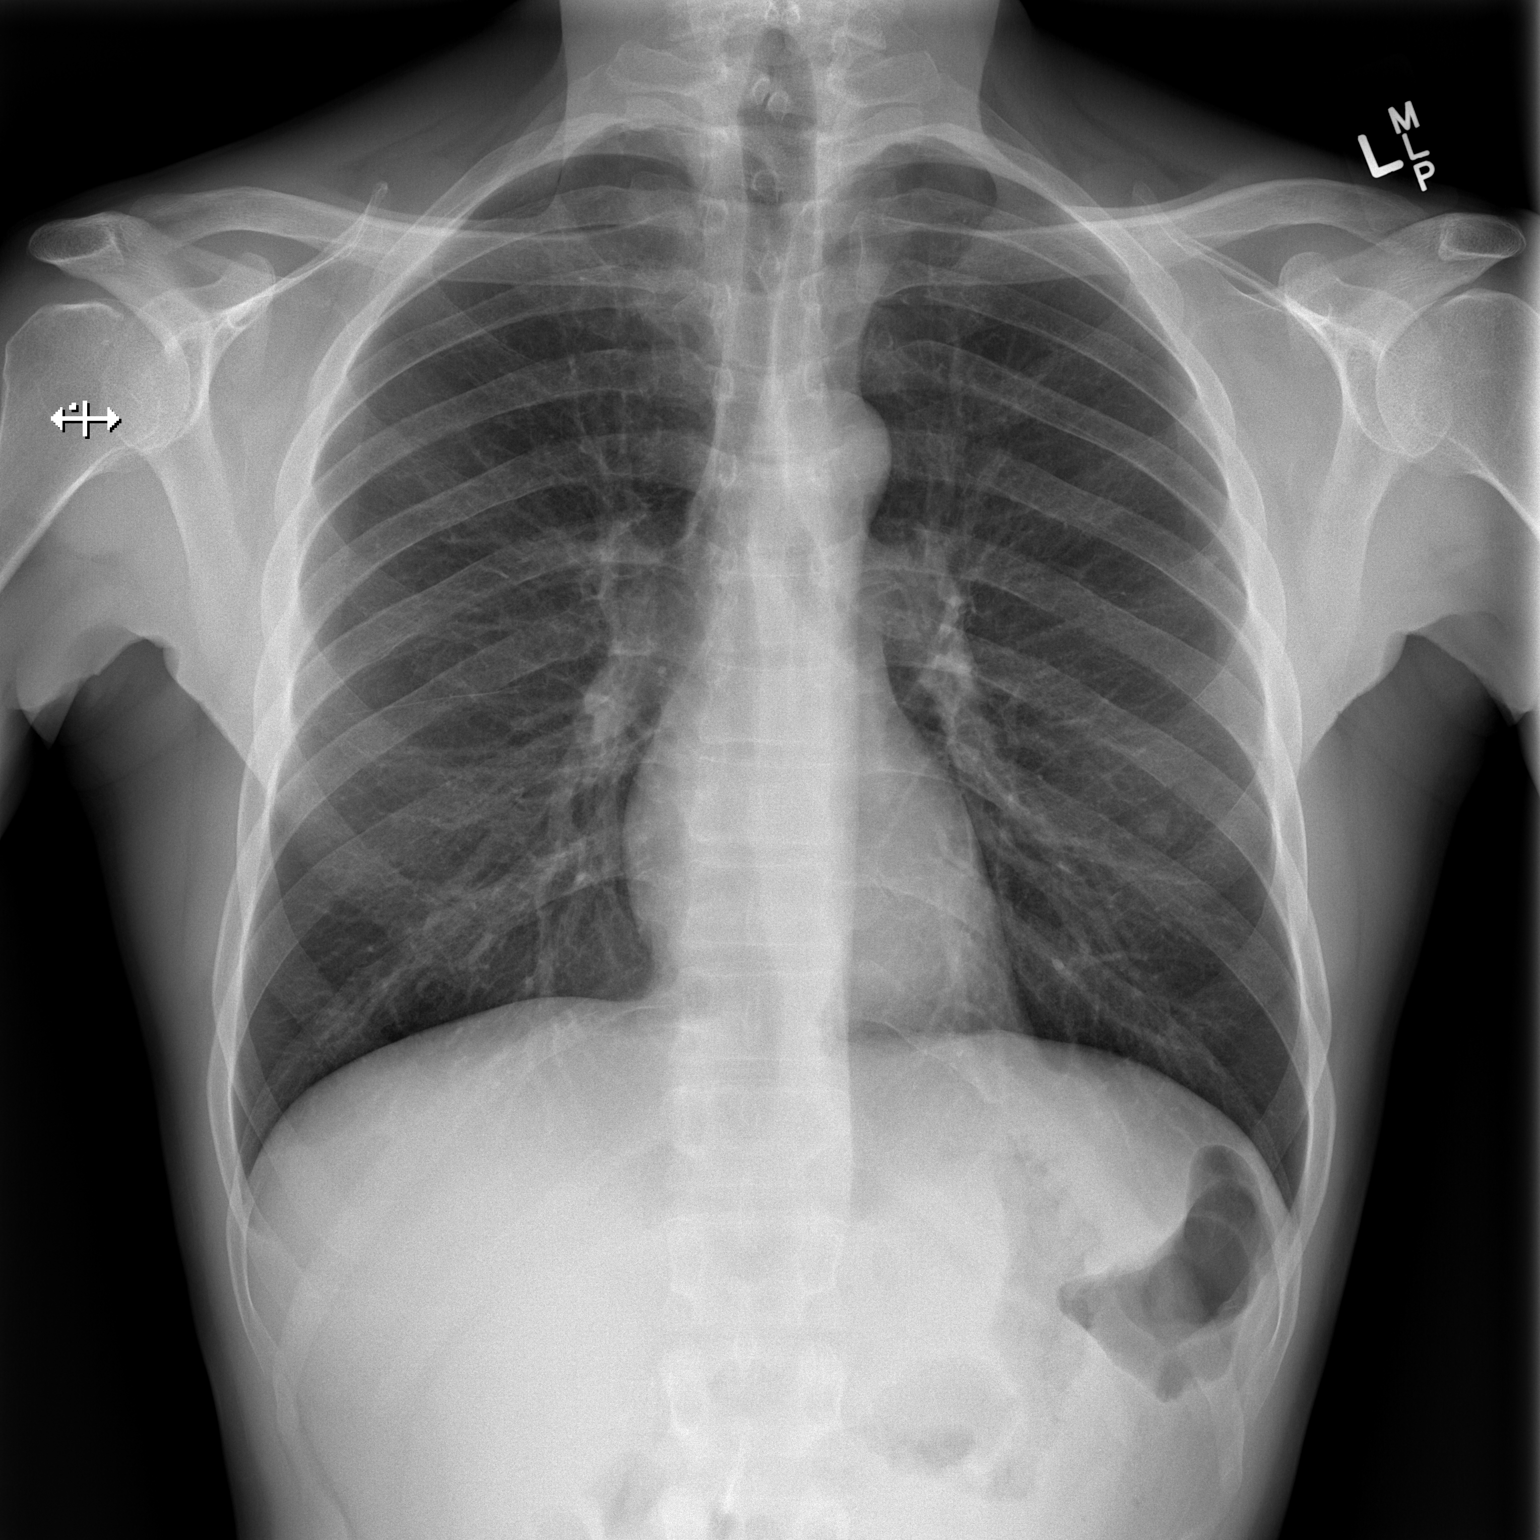

[w chest lat]
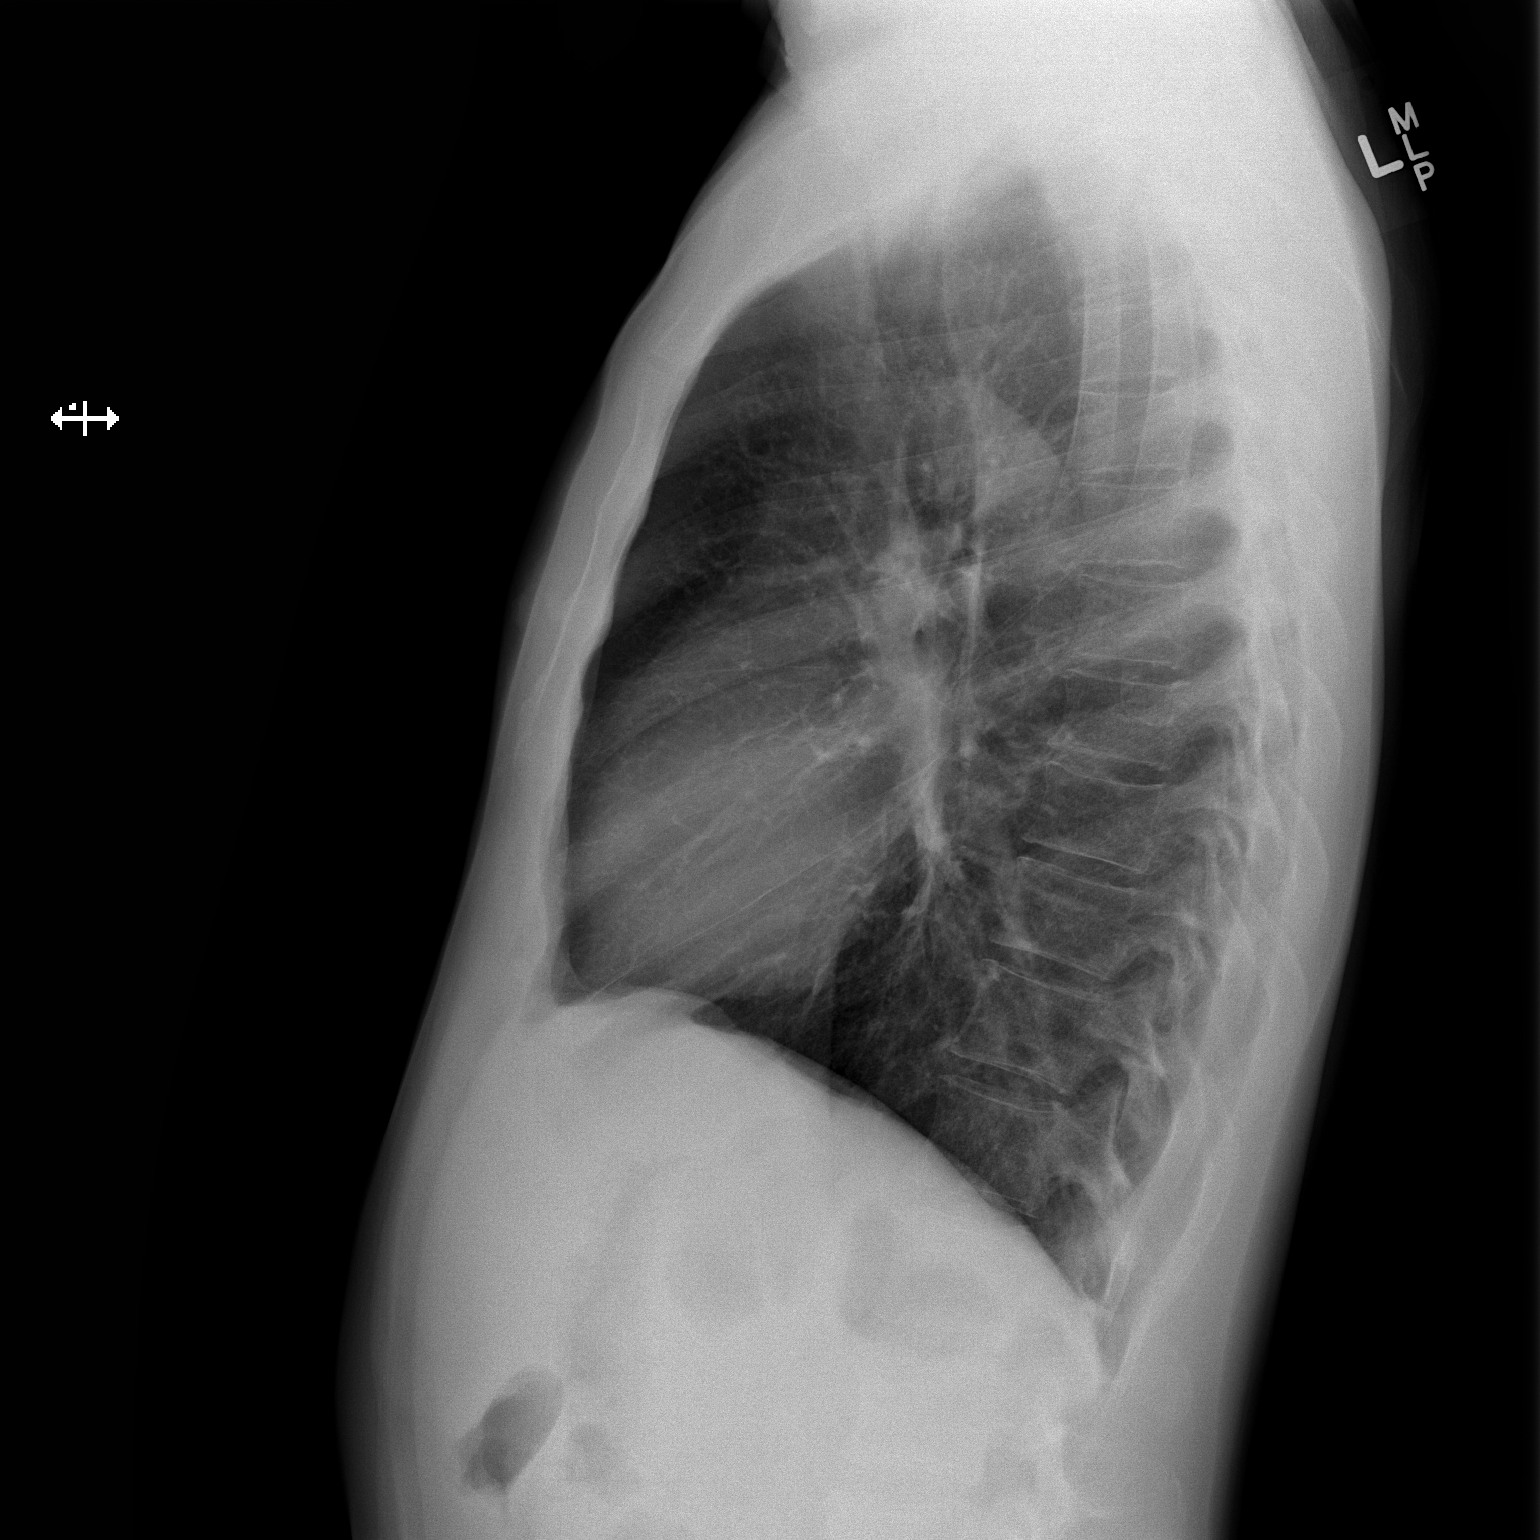

[2 of 2 positions shown; findings below may reference images not displayed]

FINDINGS: There is a questionable nipple shadow on the left. There is a 6 mm
nodular opacity in the periphery of the left upper lobe, question
bleed present on most recent chest radiograph but not appreciable 9
months prior and not seen on prior CT 2 years prior. Lungs elsewhere
clear. Heart size and pulmonary vascularity are normal. No
adenopathy. No bone lesions. No pneumothorax.
IMPRESSION: 6 mm nodular opacity left upper lobe. Questionable nipple shadow on
the left. Advise noncontrast enhanced chest CT to further assess.
Lungs elsewhere clear. No adenopathy. Stable cardiac silhouette.

## 2019-06-18 IMAGING — CT CT ABD-PELV W/O CM
2 of 4 series · 16 of 46 positions shown, 18 images · non-contrast
Comparison: 07/16/2016 and previous

CLINICAL DATA: Pancreatitis

EXAM:
CT ABDOMEN AND PELVIS WITHOUT CONTRAST
TECHNIQUE: Multidetector CT imaging of the abdomen and pelvis was performed
following the standard protocol without IV contrast.

[Series 3: a/p w/o 5mm · axial · non-contrast · 0.64mm/px · z∈[-434,-44]mm · 13 of 86 slices shown, 15 images]
[im 4/86  soft-tissue]
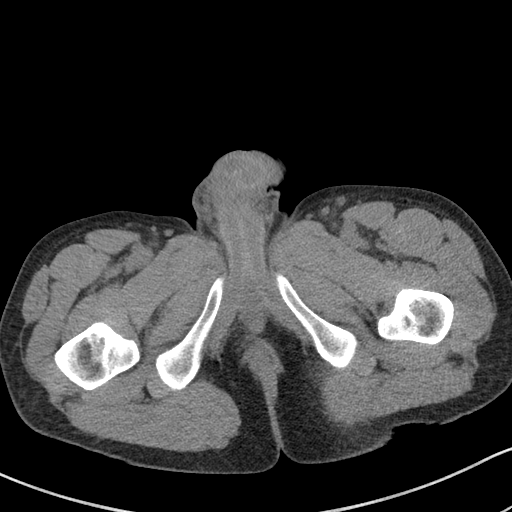
[im 4/86  bone]
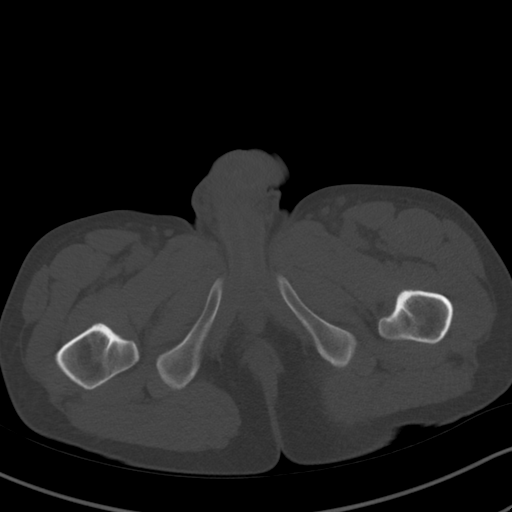
[im 11/86  soft-tissue]
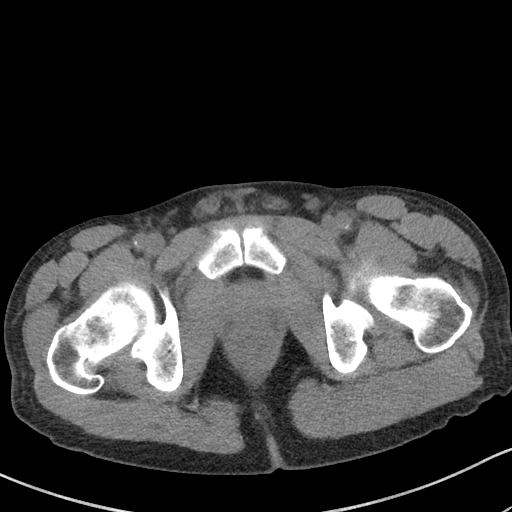
[im 18/86  soft-tissue]
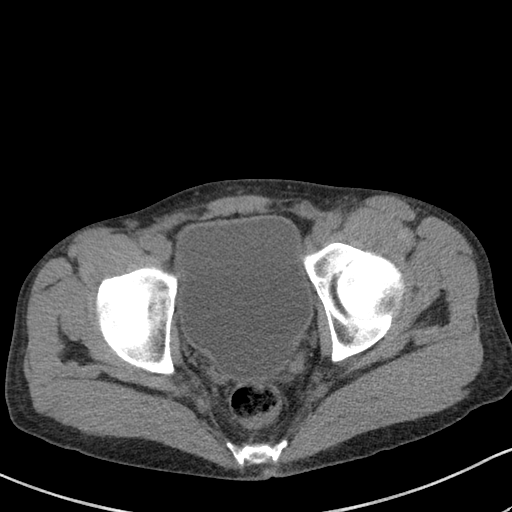
[im 25/86  soft-tissue]
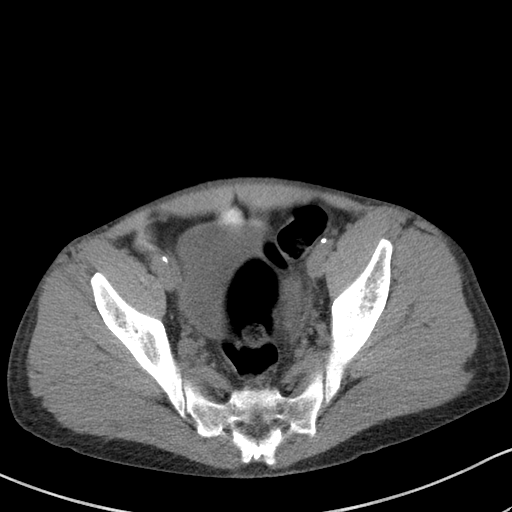
[im 29/86  soft-tissue]
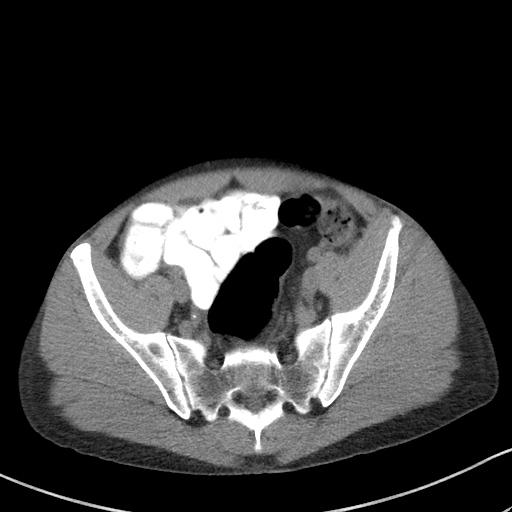
[im 36/86  soft-tissue]
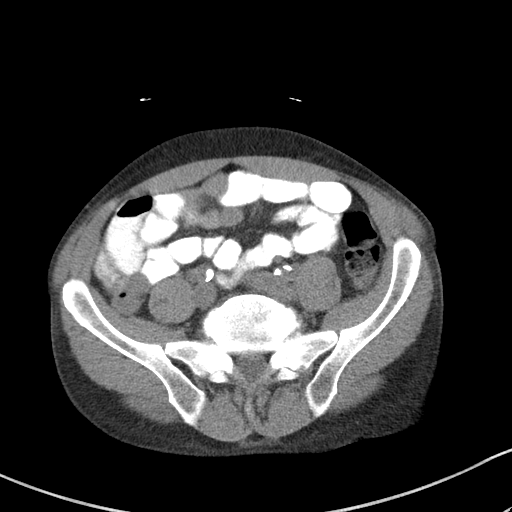
[im 43/86  soft-tissue]
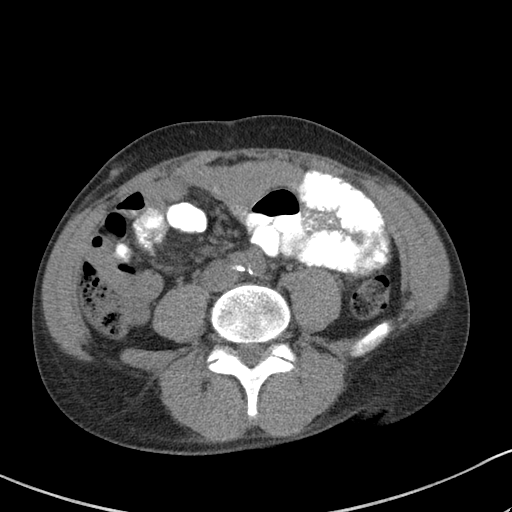
[im 50/86  soft-tissue]
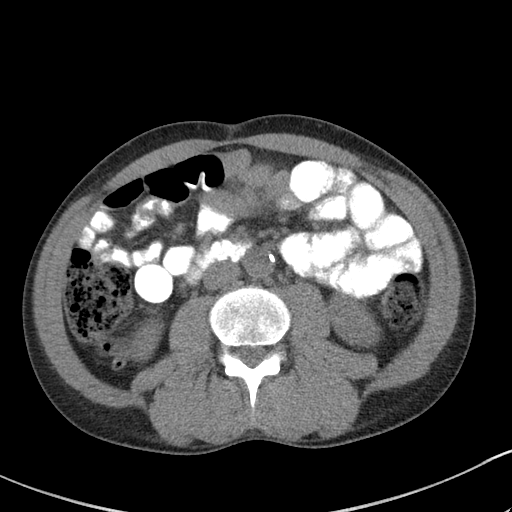
[im 57/86  soft-tissue]
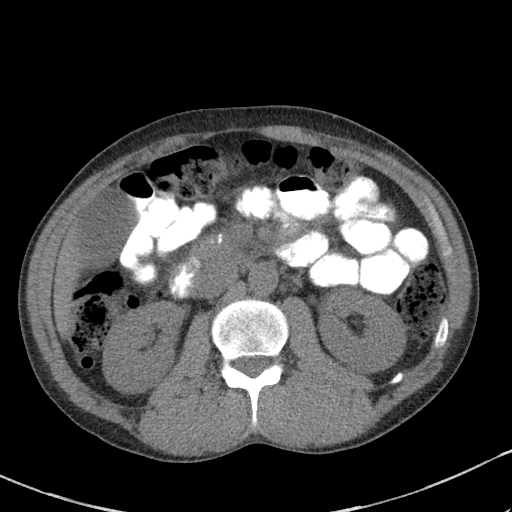
[im 57/86  bone]
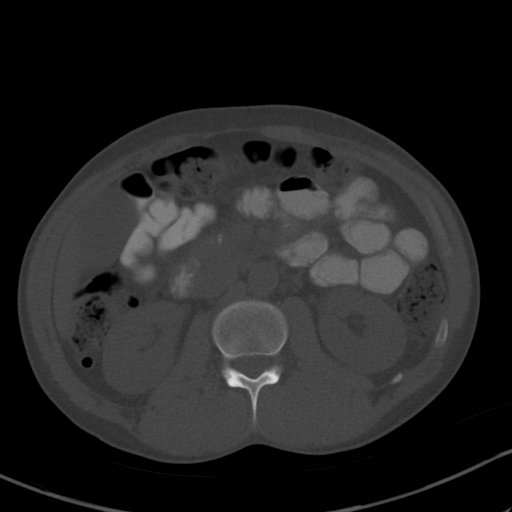
[im 61/86  soft-tissue]
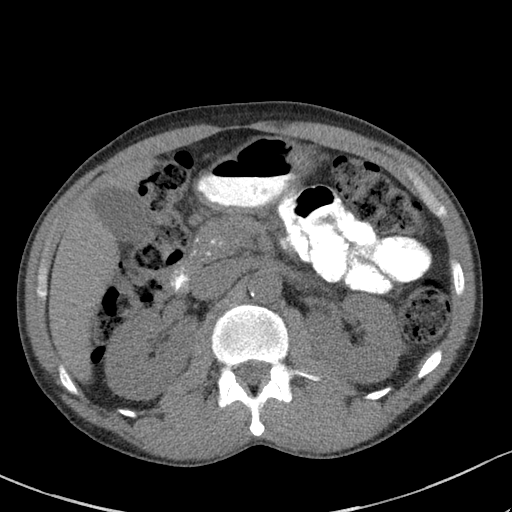
[im 68/86  soft-tissue]
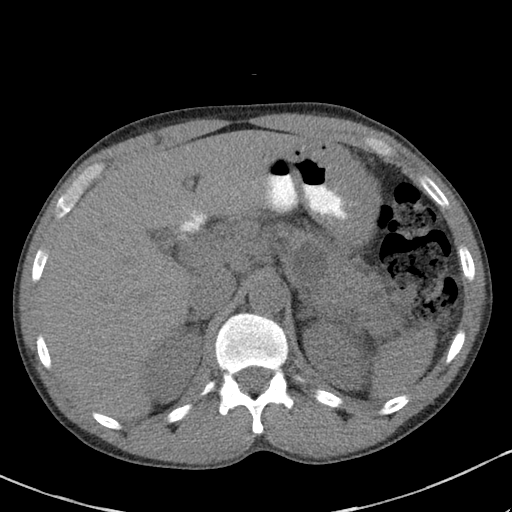
[im 75/86  soft-tissue]
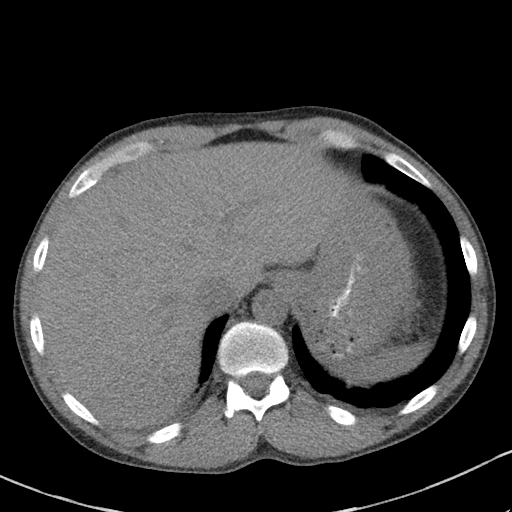
[im 82/86  soft-tissue]
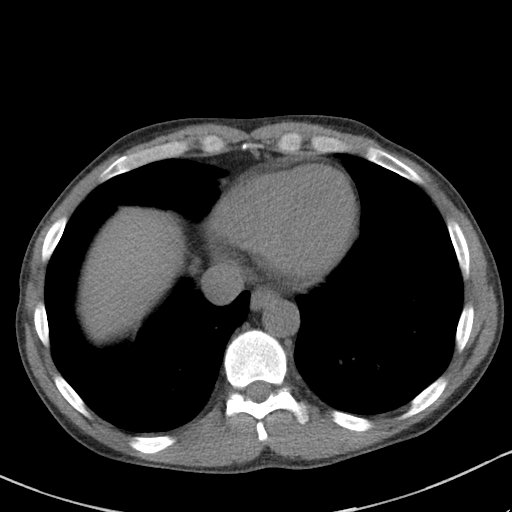

[Series 6: a/p w/o cor · coronal · non-contrast · 0.64mm/px · 3 of 150 slices shown]
[im 50/150  soft-tissue]
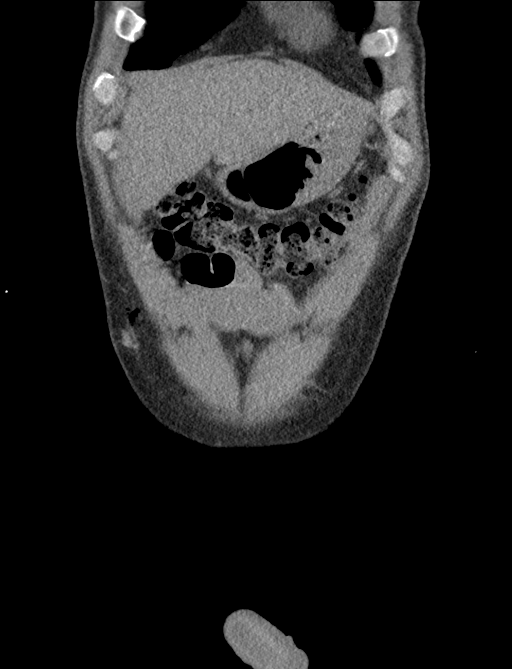
[im 67/150  soft-tissue]
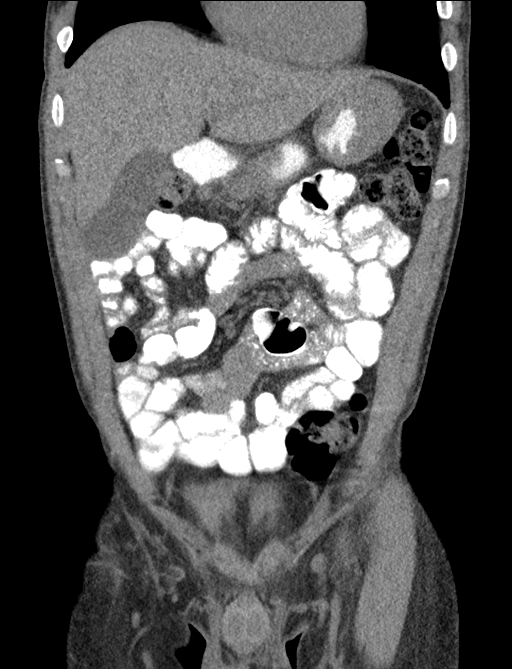
[im 83/150  soft-tissue]
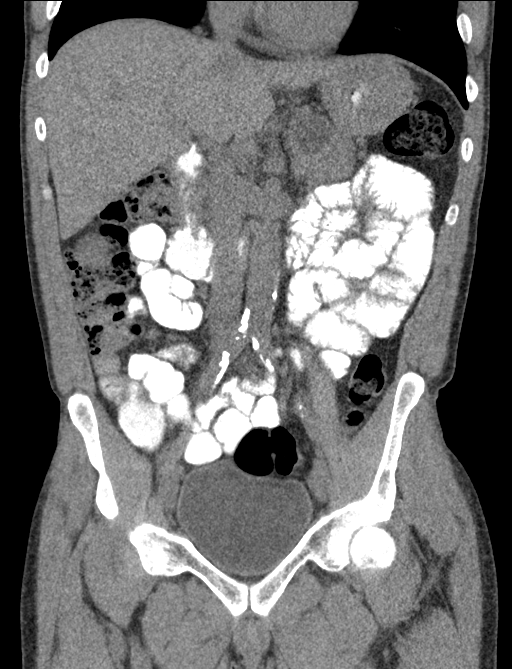

[16 of 46 positions shown; findings below may reference images not displayed]

FINDINGS: Lower chest: No acute abnormality.

Hepatobiliary: No focal liver abnormality is seen. No gallstones,
gallbladder wall thickening, or biliary dilatation.

Pancreas: 2.8 cm low-attenuation/cystic process in the mid
pancreatic body, previously 2.1 cm. Smaller low-attenuation foci in
the region of the pancreatic tail which appears enlarged compared to
the prior study with somewhat indistinct margins suggesting some
adjacent retroperitoneal inflammatory/ edematous change. Scattered
parenchymal calcifications in the pancreatic neck and head as
before. No new pseudocyst.

Spleen: Normal in size without focal abnormality.

Adrenals/Urinary Tract: Adrenal glands are unremarkable. Kidneys are
normal, without renal calculi, focal lesion, or hydronephrosis.
Bladder is unremarkable.

Stomach/Bowel: Stomach is nondistended with possible wall thickening
in the mid body. Small bowel decompressed. Appendix appears normal.
Colon nondilated, with No evidence of bowel wall thickening,
distention, or inflammatory changes.

Vascular/Lymphatic: Mild scattered aortic and iliofemoral arterial
calcifications without aneurysm.

Reproductive: Prostate is unremarkable.

Other: No ascites.  No free air.

Musculoskeletal: Degenerative disc disease L5-S1. Negative for
fracture or worrisome bone lesion.
IMPRESSION: 1. Chronic pancreatitis with pseudocysts, with probable superimposed
acute inflammatory component around the pancreatic tail.

## 2019-07-08 IMAGING — CT CT ABD-PELV W/O CM
2 of 4 series · 15 of 46 positions shown, 17 images · non-contrast
Comparison: CT abdomen dated 12/26/2016.

CLINICAL DATA: Abdominal pain with nausea, vomiting and diarrhea.

EXAM:
CT ABDOMEN AND PELVIS WITHOUT CONTRAST
TECHNIQUE: Multidetector CT imaging of the abdomen and pelvis was performed
following the standard protocol without IV contrast.

[Series 3: ap without · axial · non-contrast · 0.65mm/px · z∈[+757,+1157]mm · 12 of 91 slices shown, 14 images]
[im 6/91  soft-tissue]
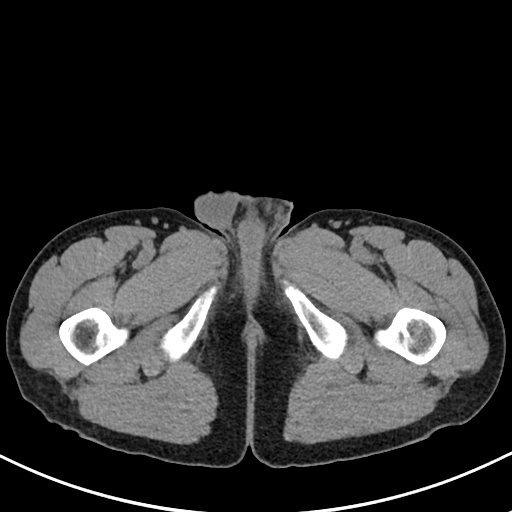
[im 6/91  bone]
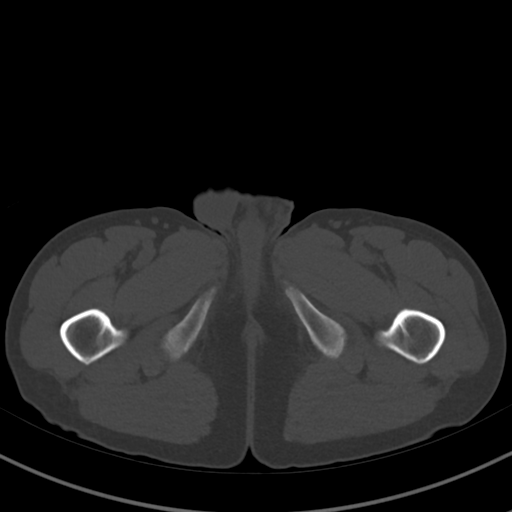
[im 16/91  soft-tissue]
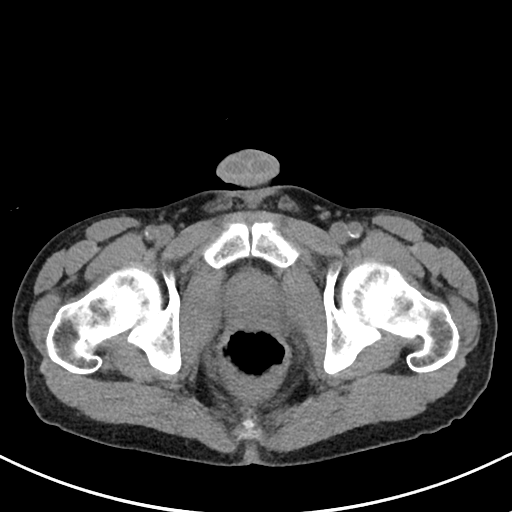
[im 21/91  soft-tissue]
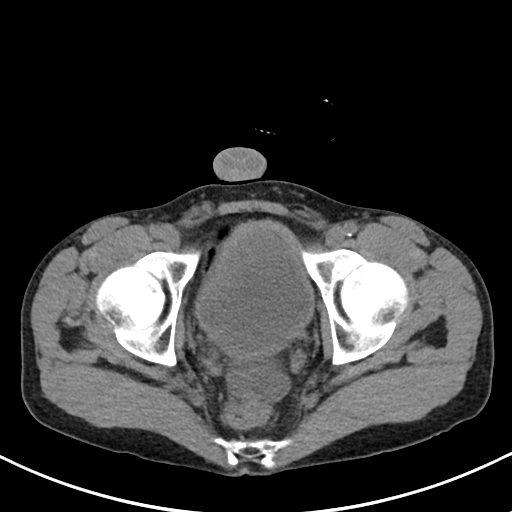
[im 26/91  soft-tissue]
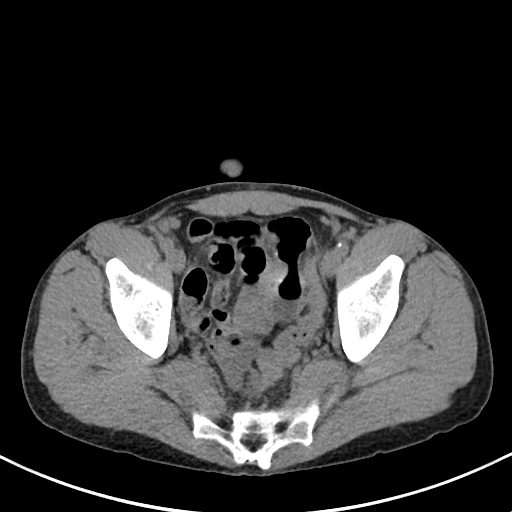
[im 36/91  soft-tissue]
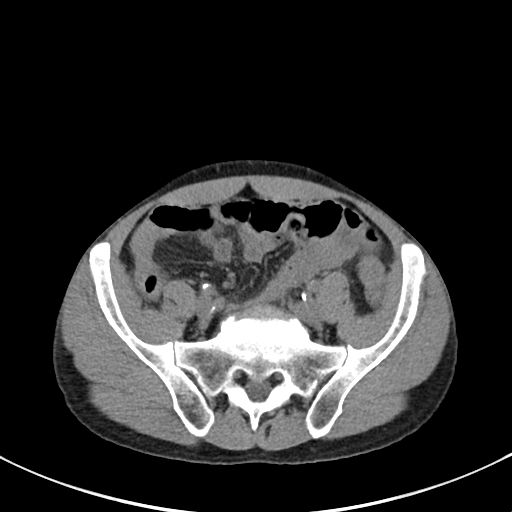
[im 41/91  soft-tissue]
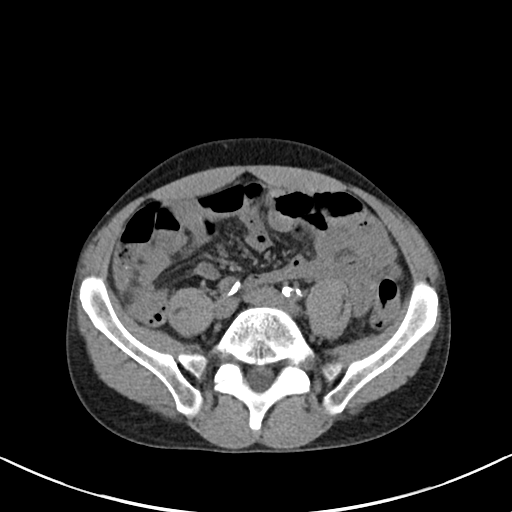
[im 51/91  soft-tissue]
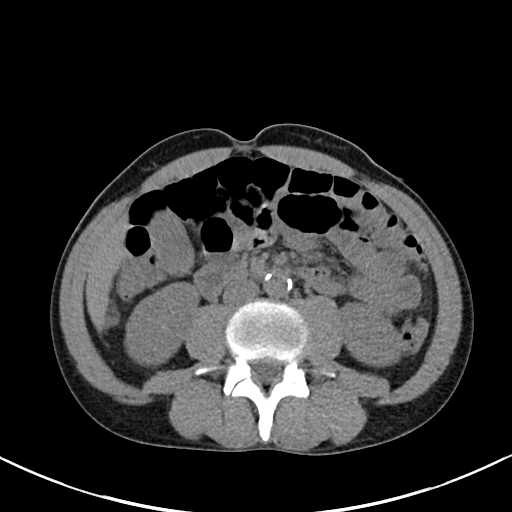
[im 56/91  soft-tissue]
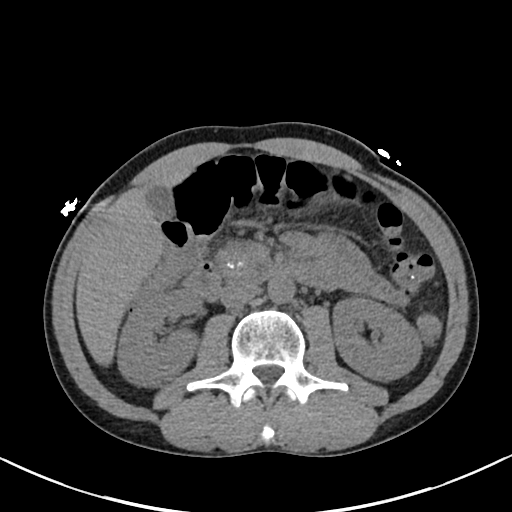
[im 66/91  soft-tissue]
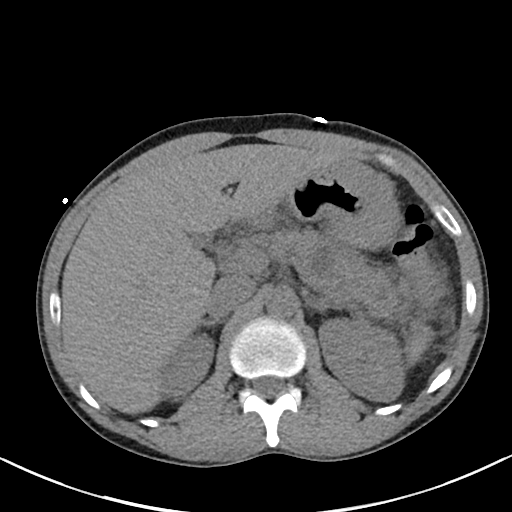
[im 66/91  bone]
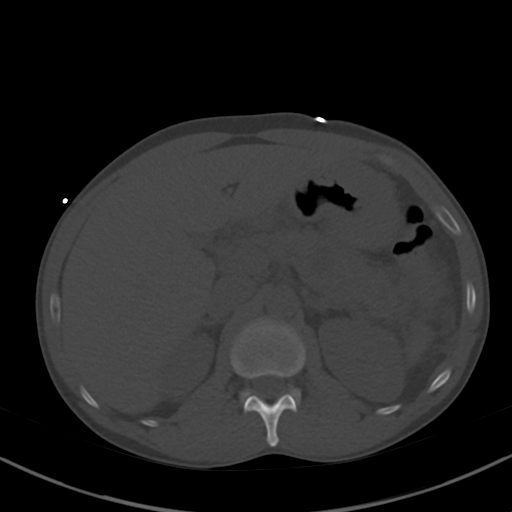
[im 71/91  soft-tissue]
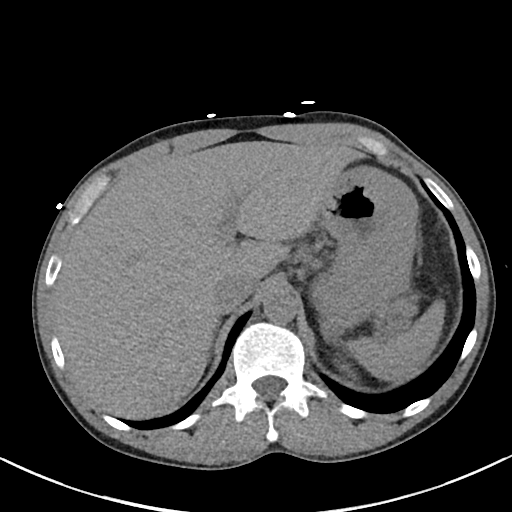
[im 76/91  soft-tissue]
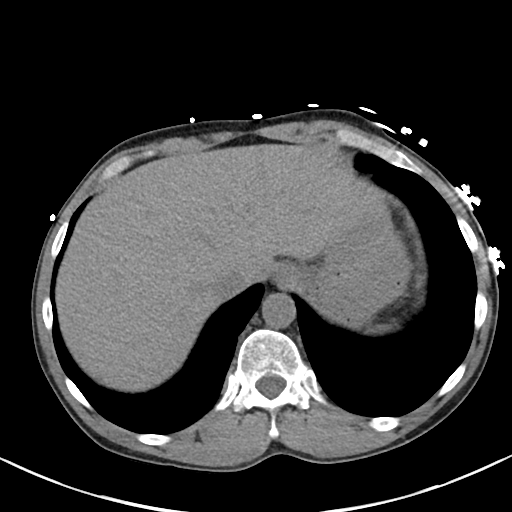
[im 86/91  soft-tissue]
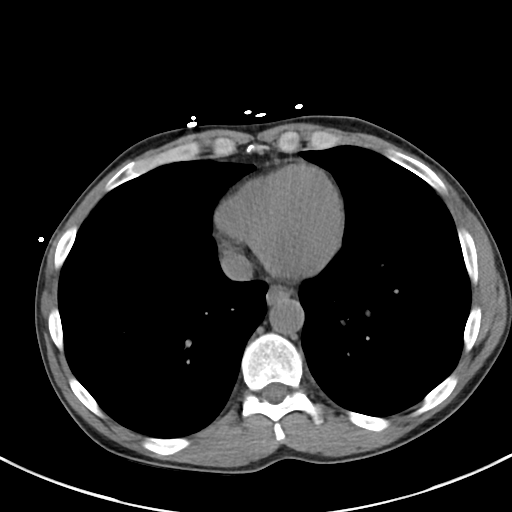

[Series 6: cor · coronal · 0.71mm/px · 3 of 101 slices shown]
[im 45/101  soft-tissue]
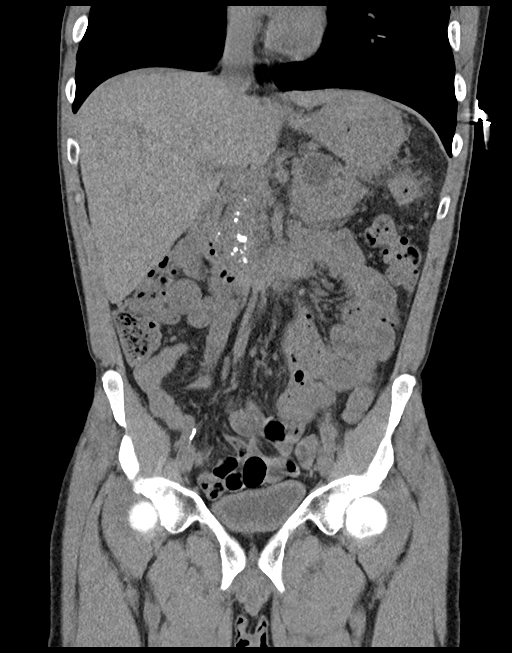
[im 56/101  soft-tissue]
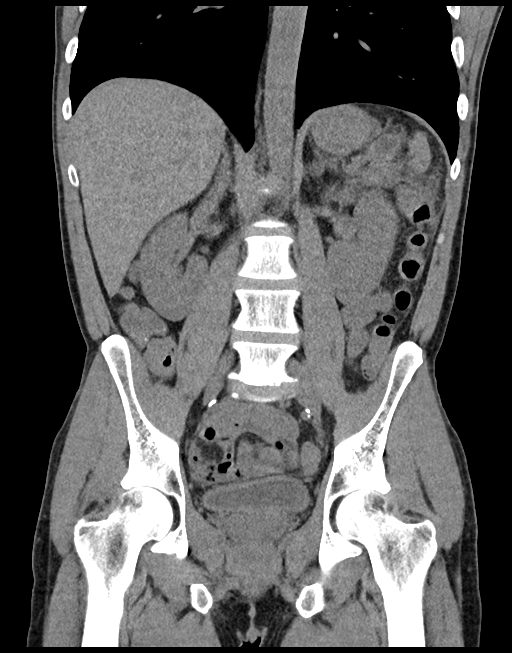
[im 67/101  soft-tissue]
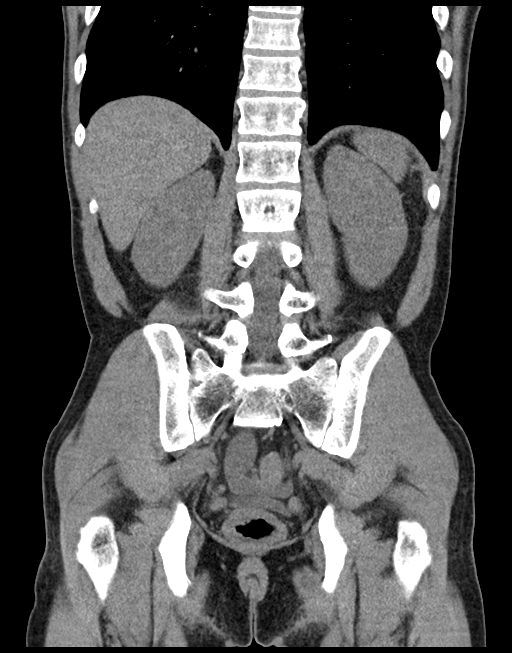

[15 of 46 positions shown; findings below may reference images not displayed]

FINDINGS: Lower chest: No acute abnormality.

Hepatobiliary: No focal liver abnormality is seen. No gallstones,
gallbladder wall thickening, or biliary dilatation.

Pancreas: Calcifications again noted within the pancreatic head
indicating sequela of chronic pancreatitis. Cystic mass within the
pancreatic body measures 2.5 cm greatest dimension, not
significantly changed compared to the most recent CT of 12/26/2016,
slightly enlarged compared the earlier CT of 07/16/2016, presumed
chronic pseudocyst.

Ill-defined fluid/edema about the pancreatic tail extending to the
left upper quadrant, similar to appearance on recent CT.

Spleen: Normal in size without focal abnormality.

Adrenals/Urinary Tract: Adrenal glands are unremarkable. Kidneys are
normal, without renal calculi, focal lesion, or hydronephrosis.
Bladder is unremarkable.

Stomach/Bowel: Bowel is normal in caliber. No evidence of
appendicitis. Walls of the stomach body again appear somewhat
thickened, but possibly artifactual related to inadequate
distention.

Vascular/Lymphatic: Aortic atherosclerosis. No enlarged lymph nodes
appreciated.

Reproductive: Prostate is unremarkable.

Other: Small amount of free fluid in the lower pelvis. No free
intraperitoneal air seen.

Musculoskeletal: Mild degenerative change at the lumbosacral
junction. No acute or suspicious osseous finding.
IMPRESSION: 1. Ill-defined fluid/edema about the pancreatic tail, suggesting
acute on chronic pancreatitis, similar to the appearance on recent
CT abdomen of 12/26/2016. Cystic appearing lesion within the
pancreatic body is not significantly changed in size or
configuration, measuring 2.5 cm greatest dimension, most likely
chronic pseudocyst as also suggested on earlier reports.
2. Walls of the stomach body and fundus appear thickened, similar to
appearance on earlier CT, but difficult to characterize due to the
inadequate stomach distension. Reactive thickening is a possibility
given the adjacent pancreatitis.
3. No bowel obstruction.
4. Aortic atherosclerosis.

## 2019-08-08 ENCOUNTER — Emergency Department (HOSPITAL_COMMUNITY): Payer: Medicaid Other

## 2019-08-08 ENCOUNTER — Encounter (HOSPITAL_COMMUNITY): Payer: Self-pay | Admitting: Emergency Medicine

## 2019-08-08 ENCOUNTER — Emergency Department (HOSPITAL_COMMUNITY)
Admission: EM | Admit: 2019-08-08 | Discharge: 2019-08-08 | Disposition: A | Discharge status: Home / Self Care | Payer: Medicaid Other | Attending: Emergency Medicine | Admitting: Emergency Medicine

## 2019-08-08 ENCOUNTER — Other Ambulatory Visit: Payer: Self-pay

## 2019-08-08 DIAGNOSIS — R000 Tachycardia, unspecified: Secondary | ICD-10-CM | POA: Insufficient documentation

## 2019-08-08 DIAGNOSIS — F319 Bipolar disorder, unspecified: Secondary | ICD-10-CM | POA: Insufficient documentation

## 2019-08-08 DIAGNOSIS — F1010 Alcohol abuse, uncomplicated: Secondary | ICD-10-CM | POA: Diagnosis not present

## 2019-08-08 DIAGNOSIS — R1013 Epigastric pain: Secondary | ICD-10-CM | POA: Insufficient documentation

## 2019-08-08 DIAGNOSIS — I10 Essential (primary) hypertension: Secondary | ICD-10-CM | POA: Insufficient documentation

## 2019-08-08 DIAGNOSIS — F17210 Nicotine dependence, cigarettes, uncomplicated: Secondary | ICD-10-CM | POA: Insufficient documentation

## 2019-08-08 DIAGNOSIS — F1290 Cannabis use, unspecified, uncomplicated: Secondary | ICD-10-CM | POA: Diagnosis not present

## 2019-08-08 DIAGNOSIS — Z79899 Other long term (current) drug therapy: Secondary | ICD-10-CM | POA: Diagnosis not present

## 2019-08-08 DIAGNOSIS — R0789 Other chest pain: Secondary | ICD-10-CM | POA: Diagnosis not present

## 2019-08-08 DIAGNOSIS — Z91040 Latex allergy status: Secondary | ICD-10-CM | POA: Diagnosis not present

## 2019-08-08 DIAGNOSIS — K921 Melena: Secondary | ICD-10-CM | POA: Insufficient documentation

## 2019-08-08 DIAGNOSIS — K920 Hematemesis: Secondary | ICD-10-CM | POA: Diagnosis present

## 2019-08-08 LAB — COMPREHENSIVE METABOLIC PANEL
ALT: 46 U/L — ABNORMAL HIGH (ref 0–44)
AST: 90 U/L — ABNORMAL HIGH (ref 15–41)
Albumin: 3.9 g/dL (ref 3.5–5.0)
Alkaline Phosphatase: 92 U/L (ref 38–126)
Anion gap: 12 (ref 5–15)
BUN: 15 mg/dL (ref 6–20)
CO2: 16 mmol/L — ABNORMAL LOW (ref 22–32)
Calcium: 8.2 mg/dL — ABNORMAL LOW (ref 8.9–10.3)
Chloride: 103 mmol/L (ref 98–111)
Creatinine, Ser: 1 mg/dL (ref 0.61–1.24)
GFR calc Af Amer: >60 mL/min (ref >60)
GFR calc non Af Amer: >60 mL/min (ref >60)
Glucose, Bld: 78 mg/dL (ref 70–99)
Potassium: 4.6 mmol/L (ref 3.5–5.1)
Sodium: 131 mmol/L — ABNORMAL LOW (ref 135–145)
Total Bilirubin: 1.6 mg/dL — ABNORMAL HIGH (ref 0.3–1.2)
Total Protein: 7.1 g/dL (ref 6.5–8.1)

## 2019-08-08 LAB — TYPE AND SCREEN
ABO/RH(D): B POS
Antibody Screen: NEGATIVE

## 2019-08-08 LAB — CBC WITH DIFFERENTIAL/PLATELET
Abs Immature Granulocytes: 0.05 10*3/uL (ref 0.00–0.07)
Basophils Absolute: 0 10*3/uL (ref 0.0–0.1)
Basophils Relative: 0 %
Eosinophils Absolute: 0 10*3/uL (ref 0.0–0.5)
Eosinophils Relative: 0 %
HCT: 38.5 % — ABNORMAL LOW (ref 39.0–52.0)
Hemoglobin: 12.3 g/dL — ABNORMAL LOW (ref 13.0–17.0)
Immature Granulocytes: 0 %
Lymphocytes Relative: 20 %
Lymphs Abs: 2.2 10*3/uL (ref 0.7–4.0)
MCH: 32.5 pg (ref 26.0–34.0)
MCHC: 31.9 g/dL (ref 30.0–36.0)
MCV: 101.9 fL — ABNORMAL HIGH (ref 80.0–100.0)
Monocytes Absolute: 1.3 10*3/uL — ABNORMAL HIGH (ref 0.1–1.0)
Monocytes Relative: 11 %
Neutro Abs: 7.8 10*3/uL — ABNORMAL HIGH (ref 1.7–7.7)
Neutrophils Relative %: 69 %
Platelets: 166 10*3/uL (ref 150–400)
RBC: 3.78 MIL/uL — ABNORMAL LOW (ref 4.22–5.81)
RDW: 15.4 % (ref 11.5–15.5)
WBC: 11.4 10*3/uL — ABNORMAL HIGH (ref 4.0–10.5)
nRBC: 0 % (ref 0.0–0.2)

## 2019-08-08 LAB — ETHANOL: Alcohol, Ethyl (B): <10 mg/dL (ref <10)

## 2019-08-08 LAB — LIPASE, BLOOD: Lipase: 17 U/L (ref 11–51)

## 2019-08-08 LAB — POC OCCULT BLOOD, ED: Fecal Occult Bld: NEGATIVE

## 2019-08-08 MED ORDER — FENTANYL CITRATE (PF) 100 MCG/2ML IJ SOLN: 25.0000 ug | Freq: Once | INTRAMUSCULAR | Status: DC

## 2019-08-08 MED ORDER — THIAMINE HCL 100 MG/ML IJ SOLN
100.0000 mg | Freq: Every day | INTRAMUSCULAR | Status: DC
  Administered 2019-08-08: 100 mg via INTRAVENOUS
  Filled 2019-08-08: qty 2

## 2019-08-08 MED ORDER — ALUM & MAG HYDROXIDE-SIMETH 200-200-20 MG/5ML PO SUSP
30.0000 mL | Freq: Once | ORAL | Status: AC
  Administered 2019-08-08: 30 mL via ORAL
  Filled 2019-08-08: qty 30

## 2019-08-08 MED ORDER — PANTOPRAZOLE SODIUM 40 MG IV SOLR
40.0000 mg | Freq: Once | INTRAVENOUS | Status: AC
  Administered 2019-08-08: 40 mg via INTRAVENOUS
  Filled 2019-08-08: qty 40

## 2019-08-08 MED ORDER — LORAZEPAM 2 MG/ML IJ SOLN: 0.0000 mg | Freq: Two times a day (BID) | INTRAMUSCULAR | Status: DC

## 2019-08-08 MED ORDER — LORAZEPAM 1 MG PO TABS
0.0000 mg | ORAL_TABLET | Freq: Four times a day (QID) | ORAL | Status: DC
  Administered 2019-08-08: 1 mg via ORAL
  Filled 2019-08-08: qty 1

## 2019-08-08 MED ORDER — LIDOCAINE VISCOUS HCL 2 % MT SOLN
15.0000 mL | Freq: Once | OROMUCOSAL | Status: AC
  Administered 2019-08-08: 15 mL via ORAL
  Filled 2019-08-08: qty 15

## 2019-08-08 MED ORDER — THIAMINE HCL 100 MG PO TABS: 100.0000 mg | ORAL_TABLET | Freq: Every day | ORAL | Status: DC

## 2019-08-08 MED ORDER — LORAZEPAM 2 MG/ML IJ SOLN: 1.0000 mg | Freq: Once | INTRAMUSCULAR | Status: DC

## 2019-08-08 MED ORDER — SODIUM CHLORIDE 0.9 % IV BOLUS
1000.0000 mL | Freq: Once | INTRAVENOUS | Status: AC
  Administered 2019-08-08: 1000 mL via INTRAVENOUS

## 2019-08-08 MED ORDER — SUCRALFATE 1 G PO TABS: 1.0000 g | ORAL_TABLET | Freq: Three times a day (TID) | ORAL | 0 refills | Status: DC

## 2019-08-08 MED ORDER — FAMOTIDINE 20 MG PO TABS: 20.0000 mg | ORAL_TABLET | Freq: Two times a day (BID) | ORAL | 0 refills | Status: DC

## 2019-08-08 MED ORDER — LORAZEPAM 2 MG/ML IJ SOLN
0.0000 mg | Freq: Four times a day (QID) | INTRAMUSCULAR | Status: DC
  Administered 2019-08-08: 2 mg via INTRAVENOUS
  Filled 2019-08-08: qty 1

## 2019-08-08 MED ORDER — LORAZEPAM 1 MG PO TABS: 0.0000 mg | ORAL_TABLET | Freq: Two times a day (BID) | ORAL | Status: DC

## 2019-08-08 MED ORDER — PANTOPRAZOLE SODIUM 40 MG PO TBEC: 40.0000 mg | DELAYED_RELEASE_TABLET | Freq: Every day | ORAL | 0 refills | Status: DC

## 2019-08-08 MED ORDER — FAMOTIDINE IN NACL 20-0.9 MG/50ML-% IV SOLN: 20.0000 mg | Freq: Once | INTRAVENOUS | Status: DC

## 2019-08-08 NOTE — Discharge Instructions (Signed)
Please read and follow all provided instructions.  Your diagnoses today include:  1. Alcohol abuse   2. Epigastric abdominal pain   3. Tachycardia     Tests performed today include:  Blood counts and electrolytes -slightly high white blood cell count and low sodium  Blood tests to check liver and kidney function -normal kidney function, liver enzymes elevated alcohol use  Blood tests to check pancreas function - was normal, no evidence of pancreatitis  Vital signs. See below for your results today.   Medications prescribed:   Protonix - stomach acid reducer  This medication can be found over-the-counter   Carafate - for stomach upset and to protect your stomach    Pepcid (famotidine) - antihistamine  You can find this medication over-the-counter.   DO NOT exceed:   20mg  Pepcid every 12 hours  Take any prescribed medications only as directed.  Home care instructions:   Follow any educational materials contained in this packet.  Follow-up instructions: Please follow-up with your primary care provider in the next 2 days for further evaluation of your symptoms.    Return instructions:  SEEK IMMEDIATE MEDICAL ATTENTION IF:  The pain does not go away or becomes severe   A temperature above 101F develops   Repeated vomiting occurs (multiple episodes)   The pain becomes localized to portions of the abdomen. The right side could possibly be appendicitis. In an adult, the left lower portion of the abdomen could be colitis or diverticulitis.   Blood is being passed in stools or vomit (bright red or black tarry stools)   You develop chest pain, difficulty breathing, dizziness or fainting, or become confused, poorly responsive, or inconsolable (young children)  If you have any other emergent concerns regarding your health  Additional Information: Abdominal (belly) pain can be caused by many things. Your caregiver performed an examination and possibly ordered  blood/urine tests and imaging (CT scan, x-rays, ultrasound). Many cases can be observed and treated at home after initial evaluation in the emergency department. Even though you are being discharged home, abdominal pain can be unpredictable. Therefore, you need a repeated exam if your pain does not resolve, returns, or worsens. Most patients with abdominal pain don't have to be admitted to the hospital or have surgery, but serious problems like appendicitis and gallbladder attacks can start out as nonspecific pain. Many abdominal conditions cannot be diagnosed in one visit, so follow-up evaluations are very important.  Your vital signs today were: BP 137/86   Pulse (!) 125   Temp 99 F (37.2 C) (Oral)   Resp 17   Ht 5\' 8"  (1.727 m)   Wt 56.7 kg   SpO2 100%   BMI 19.01 kg/m  If your blood pressure (bp) was elevated above 135/85 this visit, please have this repeated by your doctor within one month. --------------

## 2019-08-08 NOTE — ED Notes (Addendum)
Pt calls out and states "I would like to speak to a different nurse. The one in here isn't drawing blood or doing anything for me. What about protocol?" Explained to pt that no new orders had been placed for his RN to complete, that nursing staff will draw blood based on EDP assessment. Pt has a care plan that encourages limited lab testing, prompting nursing staff to await EDP eval prior to drawing blood/placing triage standing orders.

## 2019-08-08 NOTE — ED Triage Notes (Signed)
-  PT presents from home by [facility] -Chest pain X 31mins -Pt says he has pressure pain from his neck, to his shoulder down to his abdomen. -also reports blood in his stool X 2 days -he also says he vomited blood 3 hours ago once  EMS gave Fentanyl  170mcg and 560ml bolus NS

## 2019-08-08 NOTE — ED Provider Notes (Signed)
[facility] EMERGENCY DEPARTMENT Provider Note   CSN: [id number] Arrival date & time: 08/08/19  1346     History No chief complaint on file.   [name] is a 49 y.o. male with a past medical history of substance abuse, alcohol abuse, history of pancreatitis and prior upper GI bleed presenting to the ED for several episodes of bloody emesis and bloody diarrhea since yesterday.  States that the vomiting started today and he has had 2 episodes of this.  He has been taking his home medications without much improvement in his symptoms unfortunately he has also been taking Aleve and Tylenol to help with his symptoms.  He admits to daily alcohol use, drinks about one sixpack of beer daily.  His last drink was 4 days ago until this morning when he drank in the ER due to "getting the shakes."  He states this feels similar to the time he had a prior upper GI bleed.  He denies any sick contacts with similar symptoms.  He denies any shortness of breath, leg swelling, syncope.  HPI     Past Medical History:  Diagnosis Date  . Alcoholism /alcohol abuse ([place])   . Anemia   . Anxiety   . Arthritis    "knees; arms; elbows" (03/26/2015)  . Asthma   . Bipolar disorder ([place])   . Chronic bronchitis ([place])   . Chronic lower back pain   . Chronic pancreatitis ([place])   . Cocaine abuse ([place])   . Depression   . Family history of adverse reaction to anesthesia    "grandmother gets confused"  . Femoral condyle fracture ([place]) 03/08/2014   left medial/notes 03/09/2014  . GERD (gastroesophageal reflux disease)   . H/O hiatal hernia   . H/O suicide attempt 10/2012  . High cholesterol   . History of blood transfusion 10/2012   "when I tried to commit suicide"  . History of stomach ulcers   . Hypertension   . Marijuana abuse, continuous   . Migraine    "a few times/year" (03/26/2015)  . Pneumonia 1990's X 3  . PTSD (post-traumatic stress disorder)   . Sickle cell trait ([place])     . WPW (Wolff-Parkinson-White syndrome)    [name] 03/06/2013    Patient Active Problem List   Diagnosis Date Noted  . AKI (acute kidney injury) ([place]) 11/13/2018  . Seizure ([place]) 11/13/2018  . Gastritis and gastroduodenitis   . Chest pain 01/08/2018  . GI bleed 11/24/2017  . Acute blood loss anemia 11/24/2017  . Atypical chest pain 11/24/2017  . Acute pancreatitis 09/28/2017  . Abdominal pain 05/27/2017  . Hematemesis 05/27/2017  . Tachycardia 03/18/2017  . Diarrhea 03/18/2017  . Acute on chronic pancreatitis ([place]) 12/17/2016  . Intractable nausea and vomiting 12/05/2016  . Verbally abusive behavior 12/05/2016  . Normocytic anemia 12/05/2016  . Alcohol use disorder, severe, dependence ([place]) 07/25/2016  . Cocaine use disorder, severe, dependence ([place]) 07/25/2016  . 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 Leukocytosis   . Hospital acquired PNA 05/20/2015  . Chronic pancreatitis ([place]) 05/18/2015  . Pseudocyst of pancreas 05/18/2015  . Polysubstance abuse (tobacco, cocaine, THC, and ETOH) 03/26/2015  . Alcohol-induced chronic pancreatitis ([place])   . Benign essential HTN 02/06/2014  . Alcohol-induced acute pancreatitis 11/28/2013  . Pancreatic pseudocyst/cyst 11/25/2013  . Severe protein-calorie malnutrition ([place]) 10/10/2013  . Suicide attempt ([place]) 10/08/2013  . 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  TOBACCO ABUSE 03/23/2007    Past Surgical History:  Procedure Laterality Date  . BIOPSY  11/25/2017   Procedure: BIOPSY;  Surgeon: Arta Silence, MD;  Location: [facility] ENDOSCOPY;  Service: Endoscopy;;  . BIOPSY  10/14/2018   Procedure: BIOPSY;  Surgeon: Arta Silence, MD;  Location: [facility];  Service: Endoscopy;;  . CARDIAC CATHETERIZATION    . ESOPHAGOGASTRODUODENOSCOPY (EGD) WITH PROPOFOL N/A 11/25/2017   Procedure: ESOPHAGOGASTRODUODENOSCOPY (EGD) WITH PROPOFOL;  Surgeon: Arta Silence, MD;   Location: [facility];  Service: Endoscopy;  Laterality: N/A;  . ESOPHAGOGASTRODUODENOSCOPY (EGD) WITH PROPOFOL Left 10/14/2018   Procedure: ESOPHAGOGASTRODUODENOSCOPY (EGD) WITH PROPOFOL;  Surgeon: Arta Silence, MD;  Location: [facility] ENDOSCOPY;  Service: Endoscopy;  Laterality: Left;  . ESOPHAGOGASTRODUODENOSCOPY (EGD) WITH PROPOFOL N/A 11/14/2018   Procedure: ESOPHAGOGASTRODUODENOSCOPY (EGD) WITH PROPOFOL;  Surgeon: Laurence Spates, MD;  Location: [facility] ENDOSCOPY;  Service: Gastroenterology;  Laterality: N/A;  . EYE SURGERY Left 1990's   "result of trauma"   . FACIAL FRACTURE SURGERY Left 1990's   "result of trauma"   . FRACTURE SURGERY    . HERNIA REPAIR    . LEFT HEART CATHETERIZATION WITH CORONARY ANGIOGRAM Right 03/07/2013   Procedure: LEFT HEART CATHETERIZATION WITH CORONARY ANGIOGRAM;  Surgeon: [name], MD;  Location: [facility] CATH LAB;  Service: Cardiovascular;  Laterality: Right;  . UMBILICAL HERNIA REPAIR         Family History  Problem Relation Age of Onset  . Hypertension Mother   . Cirrhosis Mother   . Alcoholism Mother   . Hypertension Father   . Melanoma Father   . Hypertension Other   . Coronary artery disease Other     Social History   Tobacco Use  . Smoking status: Current Every Day Smoker    Packs/day: 1.00    Years: 33.00    Pack years: 33.00    Types: Cigarettes, E-cigarettes  . Smokeless tobacco: Never Used  Substance Use Topics  . Alcohol use: Yes  . Drug use: Yes    Types: Marijuana, Cocaine    Comment: daily marijuana use; last cocaine use about 3 months ago    Home Medications Prior to Admission medications   Medication Sig Start Date End Date Taking? Authorizing Provider  acetaminophen (TYLENOL) 500 MG tablet Take 1,000 mg by mouth every 6 (six) hours as needed for mild pain.   Yes [provider]  hydrALAZINE (APRESOLINE) 25 MG tablet Take 25 mg by mouth daily.   Yes [provider]  lipase/protease/amylase (CREON) 12000 units  CPEP capsule Take 1 capsule (12,000 Units total) by mouth 3 (three) times daily with meals. For pancreatitis 10/15/18  Yes [name], MD  loratadine (CLARITIN) 10 MG tablet Take 1 tablet (10 mg total) by mouth daily as needed for allergies or rhinitis. (May purchase from over the counter): For allergies Patient taking differently: Take 10 mg by mouth daily. (May purchase from over the counter): For allergies 10/01/17  Yes Aline August, MD  metoprolol tartrate (LOPRESSOR) 100 MG tablet Take 100 mg by mouth 2 (two) times daily.    Yes [provider]  QUEtiapine (SEROQUEL) 100 MG tablet Take 100 mg by mouth at bedtime.   Yes [provider]  sertraline (ZOLOFT) 100 MG tablet Take 1 tablet (100 mg total) by mouth daily. Patient taking differently: Take 150 mg by mouth daily.  08/29/17  Yes Clent Demark, PA-C  sucralfate (CARAFATE) 1 g tablet Take 1 tablet (1 g total) by mouth 4 (four) times  daily -  with meals and at bedtime. 12/26/18  Yes Montine Circle, PA-C  amitriptyline (ELAVIL) 25 MG tablet Take 1 tablet (25 mg total) by mouth at bedtime. 10/15/18   [name], MD  azithromycin (ZITHROMAX) 250 MG tablet Take 1 tablet (250 mg total) by mouth daily. Take first 2 tablets together, then 1 every day until finished. Patient not taking: Reported on 03/02/2019 12/26/18   Montine Circle, PA-C  Cyanocobalamin (VITAMIN B-12 PO) Take 1 tablet by mouth daily.    [provider]  cyclobenzaprine (FLEXERIL) 10 MG tablet Take 1 tablet (10 mg total) by mouth 2 (two) times daily as needed for muscle spasms. 03/03/19   Recardo Evangelist, PA-C  famotidine (PEPCID) 20 MG tablet Take 20 mg by mouth 2 (two) times daily. 06/19/19   [provider]  folic acid (FOLVITE) 1 MG tablet Take 1 tablet (1 mg total) by mouth daily. 11/26/17   [name], MD  gabapentin (NEURONTIN) 100 MG capsule Take 1 capsule (100 mg total) by mouth 2 (two) times daily. 10/15/18   [name], MD  hydrOXYzine (VISTARIL) 25 MG capsule Take 25 mg by mouth 3 (three) times daily as needed. 06/19/19   [provider]  LORazepam (ATIVAN) 0.5 MG tablet Take 0.5 mg by mouth every 8 (eight) hours as needed for anxiety.    [provider]  Multiple Vitamin (MULTIVITAMIN WITH MINERALS) TABS tablet Take 1 tablet by mouth daily. 09/06/17   Bonnell Public, MD  NASONEX 50 MCG/ACT nasal spray Place 2 sprays into the nose daily. 06/19/19   [provider]  nicotine (NICODERM CQ - DOSED IN MG/24 HOURS) 21 mg/24hr patch Place 1 patch (21 mg total) onto the skin daily. Patient not taking: Reported on 03/02/2019 10/16/18   [name], MD  nicotine polacrilex (NICORETTE) 4 MG gum Take 4 mg by mouth daily as needed for smoking cessation.    [provider]  omeprazole (PRILOSEC) 20 MG capsule Take 20 mg by mouth daily. 06/19/19   [provider]  ondansetron (ZOFRAN) 4 MG tablet Take 1 tablet (4 mg total) by mouth every 8 (eight) hours as needed for nausea or vomiting. 11/24/18   Nuala Alpha A, PA-C  oxyCODONE-acetaminophen (PERCOCET/ROXICET) 5-325 MG tablet Take 1 tablet by mouth every 8 (eight) hours as needed for severe pain.    [provider]  pantoprazole (PROTONIX) 40 MG tablet Take 1 tablet (40 mg total) by mouth daily. 03/24/19   Malvin Johns, MD  potassium chloride SA (K-DUR) 20 MEQ tablet Take 1 tablet (20 mEq total) by mouth daily. 01/07/19   Sherwood Gambler, MD  promethazine (PHENERGAN) 25 MG tablet Take 1 tablet (25 mg total) by mouth every 6 (six) hours as needed for nausea or vomiting. 03/02/19   Kinnie Feil, PA-C  thiamine 100 MG tablet Take 1 tablet (100 mg total) by mouth daily. 10/16/18   [name], MD    Allergies    Robaxin [methocarbamol], Shellfish-derived products, Trazodone, Trazodone and nefazodone, Adhesive [tape], Latex, Toradol [ketorolac tromethamine], Contrast media [iodinated diagnostic agents],  and Reglan [metoclopramide]  Review of Systems   Review of Systems  Constitutional: Negative for appetite change, chills and fever.  HENT: Negative for ear pain, rhinorrhea, sneezing and sore throat.   Eyes: Negative for photophobia and visual disturbance.  Respiratory: Negative for cough, chest tightness, shortness of breath and wheezing.   Cardiovascular: Negative for chest pain and palpitations.  Gastrointestinal: Positive for abdominal pain, blood in stool, diarrhea, nausea and vomiting. Negative for constipation.  Genitourinary: Negative for dysuria, hematuria and urgency.  Musculoskeletal: Negative for myalgias.  Skin: Negative for rash.  Neurological: Negative for dizziness, weakness and light-headedness.    Physical Exam Updated Vital Signs BP (!) 139/111   Pulse (!) 130   Temp 99 F (37.2 C) (Oral)   Resp (!) 25   Ht 5\' 8"  (1.727 m)   Wt 56.7 kg   SpO2 100%   BMI 19.01 kg/m   Physical Exam Vitals and nursing note reviewed.  Constitutional:      General: He is not in acute distress.    Appearance: He is well-developed.  HENT:     Head: Normocephalic and atraumatic.     Nose: Nose normal.  Eyes:     General: No scleral icterus.       Right eye: No discharge.        Left eye: No discharge.     Conjunctiva/sclera: Conjunctivae normal.  Cardiovascular:     Rate and Rhythm: Regular rhythm. Tachycardia present.     Heart sounds: Normal heart sounds. No murmur. No friction rub. No gallop.   Pulmonary:     Effort: Pulmonary effort is normal. No respiratory distress.     Breath sounds: Normal breath sounds.  Abdominal:     General: Bowel sounds are normal. There is no distension.     Palpations: Abdomen is soft.     Tenderness: There is abdominal tenderness (Generalized). There is no guarding.  Genitourinary:    Rectum: No external hemorrhoid or internal hemorrhoid.     Comments: Chaperone present for DRE. Brown stool noted on exam. Musculoskeletal:         General: Normal range of motion.     Cervical back: Normal range of motion and neck supple.  Skin:    General: Skin is warm and dry.     Findings: No rash.  Neurological:     Mental Status: He is alert.     Motor: No abnormal muscle tone.     Coordination: Coordination normal.     ED Results / Procedures / Treatments   Labs (all labs ordered are listed, but only abnormal results are displayed) Labs Reviewed  CBC WITH DIFFERENTIAL/PLATELET - Abnormal; Notable for the following components:      Result Value   WBC 11.4 (*)    RBC 3.78 (*)    Hemoglobin 12.3 (*)    HCT 38.5 (*)    MCV 101.9 (*)    Neutro Abs 7.8 (*)    Monocytes Absolute 1.3 (*)    All other components within normal limits  COMPREHENSIVE METABOLIC PANEL  LIPASE, BLOOD  ETHANOL  POC OCCULT BLOOD, ED  TYPE AND SCREEN    EKG None  Radiology No results found.  Procedures Procedures (including critical care time)  Medications Ordered in ED Medications  LORazepam (ATIVAN) injection 0-4 mg (2 mg Intravenous Given 08/08/19 1523)    Or  LORazepam (ATIVAN) tablet 0-4 mg ( Oral See Alternative 08/08/19 1523)  LORazepam (ATIVAN) injection 0-4 mg (has no administration in time range)    Or  LORazepam (ATIVAN) tablet 0-4 mg (has no administration in time range)  thiamine tablet 100 mg ( Oral See Alternative 08/08/19 1523)    Or  thiamine (B-1) injection 100 mg (100 mg Intravenous Given 08/08/19 1523)  alum & mag hydroxide-simeth (MAALOX/MYLANTA) 200-200-20 MG/5ML suspension 30 mL (30 mLs Oral Given 08/08/19  1522)    ED Course  I have reviewed the triage vital signs and the nursing notes.  Pertinent labs & imaging results that were available during my care of the patient were reviewed by me and considered in my medical decision making (see chart for details).    MDM Rules/Calculators/A&P                      49 year old male with past medical history of alcohol abuse, prior GI bleed, substance abuse presenting to  the ED with a chief complaint of bloody vomiting and bloody diarrhea since yesterday. States he felt similar symptoms last time he was diagnosed with a GI bleed.  He reports daily alcohol use and last drink was this morning after not drinking for 4 days.  Patient tachycardic on my evaluation, no tremors noted.  Abdomen is generally tender.  No lower extremity edema noted bilaterally. He is speaking in complete sentences without difficulty. Rectal exam without any tenderness or external abnormalities. Brown stool noted on exam. Patient requiring Ativan for CIWA score today due to his tachycardia.  Lab work significant for hemoglobin of 12.3 which is similar to baseline.  Remainder of lab work is pending.  Patient will be given GI cocktail as well as Ativan to evaluate for improvement in his symptoms. Care handed off to oncoming provider pending recheck and disposition.  Final Clinical Impression(s) / ED Diagnoses Final diagnoses:  Alcohol abuse    Rx / DC Orders ED Discharge Orders    None       Portions of this note were generated with Dragon dictation software. Dictation errors may occur despite best attempts at proofreading.    Delia Heady, PA-C 08/08/19 1534    Maudie Flakes, MD 08/12/19 [phone]

## 2019-08-08 NOTE — ED Notes (Signed)
Pt in ct 

## 2019-08-08 NOTE — ED Notes (Signed)
Pt had pulled tubing off IV bag and out of IV pump. Tube was open and filled with Blood. Pt was holding tube in air to keep it from draining. Cathter was intact and taken out.

## 2019-08-08 NOTE — ED Provider Notes (Addendum)
3:45 PM signout from [facility] at shift change.  Patient here with chronic abdominal pain with reported bloody vomit and diarrhea.  Hemoglobin stable.  Hemoccult negative.  Patient is tachycardic.  He has received GI cocktail, Ativan per CIWA protocol.  Currently awaiting acute abdominal series.  Patient has been tachycardic to 130.  EKG reviewed.  Artifact but non-ischemic. T-wave inversions improved from previous. QT is prolonged.   Fluids ordered.  Awaiting imaging.  Anticipate discharge home with improvement.  BP (!) 139/111   Pulse (!) 130   Temp 99 F (37.2 C) (Oral)   Resp (!) 25   Ht 5\' 8"  (1.727 m)   Wt 56.7 kg   SpO2 100%   BMI 19.01 kg/m   Reviewed plain film imaging.  Suggestion of possible small bowel obstruction versus basilar pneumonia.  Discussed with patient.  CT ordered to rule out obstruction given vomiting.  IV fluids ordered.  8:34 PM patient has been up in the department several times without any distress.  He continues to be tachycardic.  He has received IV fluids.  He is in no distress.  Patient continues to complain of epigastric abdominal pain.  He is requesting 1 shot of hydromorphone or fentanyl prior to discharge.  I told him that I do not feel that narcotics are appropriate at this time given his work-up.  He then requests ketamine.  I also declined.  Will give additional GI cocktail in addition to 1 mg of Ativan and Protonix.  CIWA is down to 6.  Patient is not vomiting.  I feel that he is safe for discharge home at this time.  Tachycardia is likely related to discomfort and alcohol withdrawal.  We will give prescription for Protonix, Carafate, Pepcid.  The patient was urged to return to the Emergency Department immediately with worsening of current symptoms, worsening abdominal pain, persistent vomiting, blood noted in stools, fever, or any other concerns. The patient verbalized understanding.   Patient with mild hyponatremia, normal kidney function,  slightly elevated liver enzymes consistent with alcohol use, normal lipase.  Hemoglobin is stable and Hemoccult negative.  Slight leukocytosis at 11.4.  I do not suspect significant intra-abdominal infection in light of negative CT scan.  Results for orders placed or performed during the hospital encounter of 08/08/19  Comprehensive metabolic panel  Result Value Ref Range   Sodium 131 (L) 135 - 145 mmol/L   Potassium 4.6 3.5 - 5.1 mmol/L   Chloride 103 98 - 111 mmol/L   CO2 16 (L) 22 - 32 mmol/L   Glucose, Bld 78 70 - 99 mg/dL   BUN 15 6 - 20 mg/dL   Creatinine, Ser 1.00 0.61 - 1.24 mg/dL   Calcium 8.2 (L) 8.9 - 10.3 mg/dL   Total Protein 7.1 6.5 - 8.1 g/dL   Albumin 3.9 3.5 - 5.0 g/dL   AST 90 (H) 15 - 41 U/L   ALT 46 (H) 0 - 44 U/L   Alkaline Phosphatase 92 38 - 126 U/L   Total Bilirubin 1.6 (H) 0.3 - 1.2 mg/dL   GFR calc non Af Amer >60 >60 mL/min   GFR calc Af Amer >60 >60 mL/min   Anion gap 12 5 - 15  CBC with Differential  Result Value Ref Range   WBC 11.4 (H) 4.0 - 10.5 K/uL   RBC 3.78 (L) 4.22 - 5.81 MIL/uL   Hemoglobin 12.3 (L) 13.0 - 17.0 g/dL   HCT 38.5 (L) 39.0 - 52.0 %  MCV 101.9 (H) 80.0 - 100.0 fL   MCH 32.5 26.0 - 34.0 pg   MCHC 31.9 30.0 - 36.0 g/dL   RDW 15.4 11.5 - 15.5 %   Platelets 166 150 - 400 K/uL   nRBC 0.0 0.0 - 0.2 %   Neutrophils Relative % 69 %   Neutro Abs 7.8 (H) 1.7 - 7.7 K/uL   Lymphocytes Relative 20 %   Lymphs Abs 2.2 0.7 - 4.0 K/uL   Monocytes Relative 11 %   Monocytes Absolute 1.3 (H) 0.1 - 1.0 K/uL   Eosinophils Relative 0 %   Eosinophils Absolute 0.0 0.0 - 0.5 K/uL   Basophils Relative 0 %   Basophils Absolute 0.0 0.0 - 0.1 K/uL   Immature Granulocytes 0 %   Abs Immature Granulocytes 0.05 0.00 - 0.07 K/uL  Lipase, blood  Result Value Ref Range   Lipase 17 11 - 51 U/L  Ethanol  Result Value Ref Range   Alcohol, Ethyl (B) <10 <10 mg/dL  POC occult blood, ED Provider will collect  Result Value Ref Range   Fecal Occult Bld  NEGATIVE NEGATIVE  Type and screen [place]  Result Value Ref Range   ABO/RH(D) B POS    Antibody Screen NEG    Sample Expiration      08/11/2019,2359 Performed at [facility], [address] [place], [place] [address]    CT ABDOMEN PELVIS WO CONTRAST  Result Date: 08/08/2019 CLINICAL DATA:  Chest pain. Abdominal pain. Concern for bowel obstruction. EXAM: CT ABDOMEN AND PELVIS WITHOUT CONTRAST TECHNIQUE: Multidetector CT imaging of the abdomen and pelvis was performed following the standard protocol without IV contrast. COMPARISON:  Chest and abdomen radiographs from earlier today. FINDINGS: Images obscured by streak artifact from external metallic objects, limiting assessment. Lower chest: No significant pulmonary nodules or acute consolidative airspace disease. Healed deformities in the lateral right seventh and eighth ribs. Hepatobiliary: Normal liver size. No liver mass. Normal gallbladder with no radiopaque cholelithiasis. No biliary ductal dilatation. Pancreas: Diffuse coarse pancreatic parenchymal calcifications, most prominent in the pancreatic head, unchanged, compatible with chronic pancreatitis. Marked beaded dilatation of main pancreatic duct in the pancreatic body and tail up to 3.2 cm diameter, previously 2.7 cm, mildly worsened. No definite acute peripancreatic fat stranding or fluid collections. Spleen: Normal size. No mass. Adrenals/Urinary Tract: Normal adrenals. No renal stones. No hydronephrosis. No contour deforming renal masses. Normal bladder. Stomach/Bowel: Normal non-distended stomach. No dilated small bowel loops. Scattered fluid levels in the small bowel. No small bowel wall thickening. Normal appendix. Few scattered fluid levels in the large bowel. No large bowel wall thickening, diverticulosis or pericolonic fat stranding. Vascular/Lymphatic: Atherosclerotic nonaneurysmal abdominal aorta. No pathologically enlarged lymph nodes in the abdomen or  pelvis. Reproductive: Normal size prostate. Other: No pneumoperitoneum, ascites or focal fluid collection. Musculoskeletal: No aggressive appearing focal osseous lesions. Mild degenerative disc disease in the lower lumbar spine. IMPRESSION: 1. Clear lung bases. 2. No evidence of bowel obstruction. 3. Scattered fluid levels in the small and large bowel, suggesting ileus / malabsorptive state. No bowel wall thickening. 4. Chronic pancreatitis. Marked beaded dilatation of the main pancreatic duct in the pancreatic body and tail, mildly worsened since earlier noncontrast CT study. No evidence of superimposed acute pancreatitis. 5. Aortic Atherosclerosis (ICD10-I70.0). Electronically Signed   By: Ilona Sorrel M.D.   On: 08/08/2019 18:59   DG Abdomen Acute W/Chest  Result Date: 08/08/2019 CLINICAL DATA:  Epigastric pain EXAM: DG ABDOMEN ACUTE  W/ 1V CHEST COMPARISON:  CT abdomen pelvis dated 11/24/2018 FINDINGS: Multiple mildly dilated loops of bowel are seen with air-fluid levels. Gas overlies the rectum. There is no evidence of free intraperitoneal air. No radiopaque calculi or other significant radiographic abnormality is seen. Heart size and mediastinal contours are within normal limits. There are minimal airspace opacities in the right lower lung. The left lung is clear. There is no pleural effusion or pneumothorax. IMPRESSION: 1. Mildly dilated loops of bowel with air-fluid levels as well as gas overlying the rectum. These findings may reflect early small-bowel obstruction versus ileus. 2. Minimal airspace opacities in the right lower lung may represent aspiration/pneumonia. Electronically Signed   By: Zerita Boers M.D.   On: 08/08/2019 16:17       Carlisle Cater, PA-C 08/08/19 2038    Carlisle Cater, PA-C 08/08/19 2040    Davonna Belling, MD 08/08/19 [phone]

## 2019-08-24 ENCOUNTER — Other Ambulatory Visit: Payer: Self-pay

## 2019-08-24 ENCOUNTER — Inpatient Hospital Stay (HOSPITAL_COMMUNITY)
Admission: EM | Admit: 2019-08-24 | Discharge: 2019-08-29 | DRG: 392 | Disposition: A | Discharge status: Home / Self Care | Payer: Medicaid Other | Attending: Family Medicine | Admitting: Family Medicine

## 2019-08-24 ENCOUNTER — Encounter (HOSPITAL_COMMUNITY): Payer: Self-pay | Admitting: Emergency Medicine

## 2019-08-24 DIAGNOSIS — F1290 Cannabis use, unspecified, uncomplicated: Secondary | ICD-10-CM | POA: Diagnosis present

## 2019-08-24 DIAGNOSIS — F1020 Alcohol dependence, uncomplicated: Secondary | ICD-10-CM | POA: Diagnosis present

## 2019-08-24 DIAGNOSIS — F39 Unspecified mood [affective] disorder: Secondary | ICD-10-CM

## 2019-08-24 DIAGNOSIS — Z811 Family history of alcohol abuse and dependence: Secondary | ICD-10-CM

## 2019-08-24 DIAGNOSIS — I10 Essential (primary) hypertension: Secondary | ICD-10-CM

## 2019-08-24 DIAGNOSIS — R109 Unspecified abdominal pain: Secondary | ICD-10-CM

## 2019-08-24 DIAGNOSIS — F10239 Alcohol dependence with withdrawal, unspecified: Secondary | ICD-10-CM | POA: Diagnosis present

## 2019-08-24 DIAGNOSIS — R079 Chest pain, unspecified: Secondary | ICD-10-CM | POA: Diagnosis present

## 2019-08-24 DIAGNOSIS — Z808 Family history of malignant neoplasm of other organs or systems: Secondary | ICD-10-CM

## 2019-08-24 DIAGNOSIS — Z91041 Radiographic dye allergy status: Secondary | ICD-10-CM

## 2019-08-24 DIAGNOSIS — R0789 Other chest pain: Secondary | ICD-10-CM | POA: Diagnosis present

## 2019-08-24 DIAGNOSIS — Z8249 Family history of ischemic heart disease and other diseases of the circulatory system: Secondary | ICD-10-CM

## 2019-08-24 DIAGNOSIS — Z8701 Personal history of pneumonia (recurrent): Secondary | ICD-10-CM

## 2019-08-24 DIAGNOSIS — K219 Gastro-esophageal reflux disease without esophagitis: Secondary | ICD-10-CM | POA: Diagnosis present

## 2019-08-24 DIAGNOSIS — Z20822 Contact with and (suspected) exposure to covid-19: Secondary | ICD-10-CM | POA: Diagnosis present

## 2019-08-24 DIAGNOSIS — K852 Alcohol induced acute pancreatitis without necrosis or infection: Secondary | ICD-10-CM

## 2019-08-24 DIAGNOSIS — Z8711 Personal history of peptic ulcer disease: Secondary | ICD-10-CM

## 2019-08-24 DIAGNOSIS — D573 Sickle-cell trait: Secondary | ICD-10-CM | POA: Diagnosis present

## 2019-08-24 DIAGNOSIS — K2970 Gastritis, unspecified, without bleeding: Principal | ICD-10-CM | POA: Diagnosis present

## 2019-08-24 DIAGNOSIS — F17210 Nicotine dependence, cigarettes, uncomplicated: Secondary | ICD-10-CM | POA: Diagnosis present

## 2019-08-24 DIAGNOSIS — E876 Hypokalemia: Secondary | ICD-10-CM | POA: Diagnosis present

## 2019-08-24 DIAGNOSIS — K861 Other chronic pancreatitis: Secondary | ICD-10-CM | POA: Diagnosis present

## 2019-08-24 DIAGNOSIS — K860 Alcohol-induced chronic pancreatitis: Secondary | ICD-10-CM | POA: Diagnosis present

## 2019-08-24 LAB — RAPID URINE DRUG SCREEN, HOSP PERFORMED
Amphetamines: NOT DETECTED
Barbiturates: NOT DETECTED
Benzodiazepines: NOT DETECTED
Cocaine: NOT DETECTED
Opiates: NOT DETECTED
Tetrahydrocannabinol: POSITIVE — AB

## 2019-08-24 LAB — LIPASE, BLOOD: Lipase: 19 U/L (ref 11–51)

## 2019-08-24 LAB — BASIC METABOLIC PANEL
Anion gap: 12 (ref 5–15)
BUN: 7 mg/dL (ref 6–20)
CO2: 20 mmol/L — ABNORMAL LOW (ref 22–32)
Calcium: 9.9 mg/dL (ref 8.9–10.3)
Chloride: 102 mmol/L (ref 98–111)
Creatinine, Ser: 0.59 mg/dL — ABNORMAL LOW (ref 0.61–1.24)
GFR calc Af Amer: >60 mL/min (ref >60)
GFR calc non Af Amer: >60 mL/min (ref >60)
Glucose, Bld: 86 mg/dL (ref 70–99)
Potassium: 4.8 mmol/L (ref 3.5–5.1)
Sodium: 134 mmol/L — ABNORMAL LOW (ref 135–145)

## 2019-08-24 LAB — CBC
HCT: 35.4 % — ABNORMAL LOW (ref 39.0–52.0)
Hemoglobin: 11.2 g/dL — ABNORMAL LOW (ref 13.0–17.0)
MCH: 31.5 pg (ref 26.0–34.0)
MCHC: 31.6 g/dL (ref 30.0–36.0)
MCV: 99.4 fL (ref 80.0–100.0)
Platelets: 310 10*3/uL (ref 150–400)
RBC: 3.56 MIL/uL — ABNORMAL LOW (ref 4.22–5.81)
RDW: 15.5 % (ref 11.5–15.5)
WBC: 8 10*3/uL (ref 4.0–10.5)
nRBC: 0 % (ref 0.0–0.2)

## 2019-08-24 LAB — HEPATIC FUNCTION PANEL
ALT: 31 U/L (ref 0–44)
AST: 38 U/L (ref 15–41)
Albumin: 3.5 g/dL (ref 3.5–5.0)
Alkaline Phosphatase: 82 U/L (ref 38–126)
Bilirubin, Direct: 0.1 mg/dL (ref 0.0–0.2)
Indirect Bilirubin: 0.5 mg/dL (ref 0.3–0.9)
Total Bilirubin: 0.6 mg/dL (ref 0.3–1.2)
Total Protein: 7.4 g/dL (ref 6.5–8.1)

## 2019-08-24 LAB — TROPONIN I (HIGH SENSITIVITY)
Troponin I (High Sensitivity): 4 ng/L (ref <18)
Troponin I (High Sensitivity): 4 ng/L (ref <18)

## 2019-08-24 MED ORDER — ALUM & MAG HYDROXIDE-SIMETH 200-200-20 MG/5ML PO SUSP
30.0000 mL | Freq: Once | ORAL | Status: AC
  Administered 2019-08-24: 18:00:00 30 mL via ORAL
  Filled 2019-08-24: qty 30

## 2019-08-24 MED ORDER — QUETIAPINE FUMARATE 100 MG PO TABS
100.0000 mg | ORAL_TABLET | Freq: Every day | ORAL | Status: DC
Start: 2019-08-25 — End: 2019-08-29
  Administered 2019-08-25 – 2019-08-27 (×3): 100 mg via ORAL
  Filled 2019-08-24 (×3): qty 1

## 2019-08-24 MED ORDER — THIAMINE HCL 100 MG/ML IJ SOLN
100.0000 mg | Freq: Every day | INTRAMUSCULAR | Status: DC
  Administered 2019-08-25: 100 mg via INTRAVENOUS
  Filled 2019-08-24 (×2): qty 2

## 2019-08-24 MED ORDER — MORPHINE SULFATE (PF) 2 MG/ML IV SOLN
2.0000 mg | INTRAVENOUS | Status: AC | PRN
  Administered 2019-08-25 – 2019-08-26 (×2): 2 mg via INTRAVENOUS
  Filled 2019-08-24 (×4): qty 1

## 2019-08-24 MED ORDER — ONDANSETRON HCL 4 MG/2ML IJ SOLN
4.0000 mg | Freq: Four times a day (QID) | INTRAMUSCULAR | Status: DC | PRN
  Administered 2019-08-26: 4 mg via INTRAVENOUS
  Filled 2019-08-24: qty 2

## 2019-08-24 MED ORDER — ALUM & MAG HYDROXIDE-SIMETH 200-200-20 MG/5ML PO SUSP
30.0000 mL | Freq: Once | ORAL | Status: AC
  Administered 2019-08-25: 30 mL via ORAL
  Filled 2019-08-24: qty 30

## 2019-08-24 MED ORDER — ADULT MULTIVITAMIN W/MINERALS CH
1.0000 | ORAL_TABLET | Freq: Every day | ORAL | Status: DC
  Administered 2019-08-26 – 2019-08-29 (×4): 1 via ORAL
  Filled 2019-08-24 (×5): qty 1

## 2019-08-24 MED ORDER — METOPROLOL TARTRATE 100 MG PO TABS
100.0000 mg | ORAL_TABLET | Freq: Two times a day (BID) | ORAL | Status: DC
  Administered 2019-08-25 – 2019-08-29 (×8): 100 mg via ORAL
  Filled 2019-08-24 (×4): qty 1
  Filled 2019-08-24: qty 4
  Filled 2019-08-24 (×3): qty 1

## 2019-08-24 MED ORDER — LORAZEPAM 2 MG/ML IJ SOLN
1.0000 mg | INTRAMUSCULAR | Status: DC | PRN
  Administered 2019-08-24: 1 mg via INTRAVENOUS
  Administered 2019-08-25: 03:00:00 2 mg via INTRAVENOUS
  Administered 2019-08-25: 3 mg via INTRAVENOUS
  Administered 2019-08-25: 1 mg via INTRAVENOUS
  Administered 2019-08-25 (×7): 4 mg via INTRAVENOUS
  Filled 2019-08-24 (×5): qty 2
  Filled 2019-08-24 (×2): qty 1
  Filled 2019-08-24: qty 2
  Filled 2019-08-24: qty 1
  Filled 2019-08-24 (×2): qty 2

## 2019-08-24 MED ORDER — LIDOCAINE VISCOUS HCL 2 % MT SOLN
15.0000 mL | Freq: Once | OROMUCOSAL | Status: AC
  Administered 2019-08-25: 01:00:00 15 mL via ORAL
  Filled 2019-08-24: qty 15

## 2019-08-24 MED ORDER — SUCRALFATE 1 G PO TABS
1.0000 g | ORAL_TABLET | Freq: Three times a day (TID) | ORAL | Status: DC
  Administered 2019-08-26 – 2019-08-29 (×14): 1 g via ORAL
  Filled 2019-08-24 (×14): qty 1

## 2019-08-24 MED ORDER — THIAMINE HCL 100 MG PO TABS
100.0000 mg | ORAL_TABLET | Freq: Every day | ORAL | Status: DC
  Administered 2019-08-26 – 2019-08-29 (×4): 100 mg via ORAL
  Filled 2019-08-24 (×4): qty 1

## 2019-08-24 MED ORDER — SODIUM CHLORIDE 0.9 % IV SOLN: INTRAVENOUS | Status: AC

## 2019-08-24 MED ORDER — SERTRALINE HCL 50 MG PO TABS
150.0000 mg | ORAL_TABLET | Freq: Every day | ORAL | Status: DC
  Administered 2019-08-26 – 2019-08-29 (×4): 150 mg via ORAL
  Filled 2019-08-24 (×4): qty 1

## 2019-08-24 MED ORDER — SODIUM CHLORIDE 0.9% FLUSH: 3.0000 mL | Freq: Once | INTRAVENOUS | Status: DC

## 2019-08-24 MED ORDER — LIDOCAINE VISCOUS HCL 2 % MT SOLN
15.0000 mL | Freq: Once | OROMUCOSAL | Status: AC
  Administered 2019-08-24: 18:00:00 15 mL via ORAL
  Filled 2019-08-24: qty 15

## 2019-08-24 MED ORDER — ACETAMINOPHEN 500 MG PO TABS
1000.0000 mg | ORAL_TABLET | Freq: Three times a day (TID) | ORAL | Status: DC | PRN
  Administered 2019-08-27 – 2019-08-29 (×4): 1000 mg via ORAL
  Filled 2019-08-24 (×4): qty 2

## 2019-08-24 MED ORDER — FOLIC ACID 1 MG PO TABS
1.0000 mg | ORAL_TABLET | Freq: Every day | ORAL | Status: DC
  Filled 2019-08-24: qty 1

## 2019-08-24 MED ORDER — FAMOTIDINE 20 MG PO TABS
20.0000 mg | ORAL_TABLET | Freq: Once | ORAL | Status: AC
  Administered 2019-08-24: 20 mg via ORAL
  Filled 2019-08-24: qty 1

## 2019-08-24 MED ORDER — LORAZEPAM 1 MG PO TABS: 1.0000 mg | ORAL_TABLET | ORAL | Status: DC | PRN

## 2019-08-24 MED ORDER — HYDRALAZINE HCL 25 MG PO TABS
25.0000 mg | ORAL_TABLET | Freq: Three times a day (TID) | ORAL | Status: DC
  Administered 2019-08-25 – 2019-08-29 (×11): 25 mg via ORAL
  Filled 2019-08-24 (×11): qty 1

## 2019-08-24 MED ORDER — PANTOPRAZOLE SODIUM 40 MG IV SOLR
40.0000 mg | Freq: Two times a day (BID) | INTRAVENOUS | Status: DC
Start: 2019-08-25 — End: 2019-08-29
  Administered 2019-08-25 – 2019-08-29 (×9): 40 mg via INTRAVENOUS
  Filled 2019-08-24 (×9): qty 40

## 2019-08-24 MED ORDER — PANCRELIPASE (LIP-PROT-AMYL) 12000-38000 UNITS PO CPEP
12000.0000 [IU] | ORAL_CAPSULE | Freq: Three times a day (TID) | ORAL | Status: DC
  Administered 2019-08-26 – 2019-08-28 (×7): 12000 [IU] via ORAL
  Filled 2019-08-24 (×9): qty 1

## 2019-08-24 MED ORDER — NITROGLYCERIN 0.4 MG SL SUBL
0.4000 mg | SUBLINGUAL_TABLET | SUBLINGUAL | Status: DC | PRN
  Administered 2019-08-24: 17:00:00 0.4 mg via SUBLINGUAL
  Filled 2019-08-24: qty 1

## 2019-08-24 MED ORDER — HYDROXYZINE PAMOATE 25 MG PO CAPS
25.0000 mg | ORAL_CAPSULE | Freq: Three times a day (TID) | ORAL | Status: DC | PRN
Start: 2019-08-24 — End: 2019-08-25
  Filled 2019-08-24: qty 1

## 2019-08-24 NOTE — ED Notes (Signed)
Pt denies any relief of pain with Nitro.  Intermittently moans out and grabs his chest and then goes back to his phone activities.

## 2019-08-24 NOTE — ED Provider Notes (Signed)
[facility] EMERGENCY DEPARTMENT Provider Note   CSN: [id number] Arrival date & time: 08/24/19  1338     History Chief Complaint  Patient presents with  . Chest Pain    [name] is a 49 y.o. male history of alcoholism, chronic recurrent pancreatitis related to alcohol use, cocaine abuse, hypertension, high cholesterol, WPW, sickle cell trait, presented to emergency department with chest pain, neck pain, abdominal pain.  He reports onset of sharp left-sided chest pain that radiates up into the left side of his jaw beginning this morning after waking up.  This is nonexertional.  He claims that he may have had similar symptoms in the past, he is unsure.  He says that he thinks he is also having pain from "my stomach ulcers" and also from "my pancreatitis".  He reports has had pancreatitis multiple times in the past which per record review appears to be related to his chronic alcohol use.  Says he most recently drank all alcohol yesterday.  He says he keeps drinking because if he stops he goes into withdrawal.  He does report a history of 1 seizure in the past.  He denies any history of cardiac stents or MI.  He does smoke cigarettes.  He denies any recent cocaine use in the "past week," but does smoke marijuana daily.  Reports he also has severe stabbing epigastric pain.  This is similar to his pancreatitis or his ulcers in the past.  HPI     Past Medical History:  Diagnosis Date  . Alcoholism /alcohol abuse ([place])   . Anemia   . Anxiety   . Arthritis    "knees; arms; elbows" (03/26/2015)  . Asthma   . Bipolar disorder ([place])   . Chronic bronchitis ([place])   . Chronic lower back pain   . Chronic pancreatitis ([place])   . Cocaine abuse ([place])   . Depression   . Family history of adverse reaction to anesthesia    "grandmother gets confused"  . Femoral condyle fracture ([place]) 03/08/2014   left medial/notes 03/09/2014  . GERD (gastroesophageal reflux disease)    . H/O hiatal hernia   . H/O suicide attempt 10/2012  . High cholesterol   . History of blood transfusion 10/2012   "when I tried to commit suicide"  . History of stomach ulcers   . Hypertension   . Marijuana abuse, continuous   . Migraine    "a few times/year" (03/26/2015)  . Pneumonia 1990's X 3  . PTSD (post-traumatic stress disorder)   . Sickle cell trait ([place])   . WPW (Wolff-Parkinson-White syndrome)    [name] 03/06/2013    Patient Active Problem List   Diagnosis Date Noted  . AKI (acute kidney injury) ([place]) 11/13/2018  . Seizure ([place]) 11/13/2018  . Gastritis and gastroduodenitis   . Chest pain 01/08/2018  . GI bleed 11/24/2017  . Acute blood loss anemia 11/24/2017  . Atypical chest pain 11/24/2017  . Acute pancreatitis 09/28/2017  . Abdominal pain 05/27/2017  . Hematemesis 05/27/2017  . Tachycardia 03/18/2017  . Diarrhea 03/18/2017  . Acute on chronic pancreatitis ([place]) 12/17/2016  . Intractable nausea and vomiting 12/05/2016  . Verbally abusive behavior 12/05/2016  . Normocytic anemia 12/05/2016  . Alcohol use disorder, severe, dependence ([place]) 07/25/2016  . Cocaine use disorder, severe, dependence ([place]) 07/25/2016  . Major depressive disorder, recurrent severe without psychotic features ([place]) 07/20/2016  . Leukocytosis   . Hospital acquired PNA 05/20/2015  . Chronic pancreatitis ([place]) 05/18/2015  .  Pseudocyst of pancreas 05/18/2015  . Polysubstance abuse (tobacco, cocaine, THC, and ETOH) 03/26/2015  . Alcohol-induced chronic pancreatitis ([place])   . Essential hypertension 02/06/2014  . Mood disorder ([place]) 02/06/2014  . Alcohol-induced acute pancreatitis 11/28/2013  . Pancreatic pseudocyst/cyst 11/25/2013  . Severe protein-calorie malnutrition ([place]) 10/10/2013  . Suicide attempt ([place]) 10/08/2013  . 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 TOBACCO ABUSE 03/23/2007    Past Surgical History:  Procedure Laterality Date  . BIOPSY   11/25/2017   Procedure: BIOPSY;  Surgeon: Arta Silence, MD;  Location: [facility] ENDOSCOPY;  Service: Endoscopy;;  . BIOPSY  10/14/2018   Procedure: BIOPSY;  Surgeon: Arta Silence, MD;  Location: [facility];  Service: Endoscopy;;  . CARDIAC CATHETERIZATION    . ESOPHAGOGASTRODUODENOSCOPY (EGD) WITH PROPOFOL N/A 11/25/2017   Procedure: ESOPHAGOGASTRODUODENOSCOPY (EGD) WITH PROPOFOL;  Surgeon: Arta Silence, MD;  Location: [facility];  Service: Endoscopy;  Laterality: N/A;  . ESOPHAGOGASTRODUODENOSCOPY (EGD) WITH PROPOFOL Left 10/14/2018   Procedure: ESOPHAGOGASTRODUODENOSCOPY (EGD) WITH PROPOFOL;  Surgeon: Arta Silence, MD;  Location[facility] ENDOSCOPY;  Service: Endoscopy;  Laterality: Left;  . ESOPHAGOGASTRODUODENOSCOPY (EGD) WITH PROPOFOL N/A 11/14/2018   Procedure: ESOPHAGOGASTRODUODENOSCOPY (EGD) WITH PROPOFOL;  Surgeon: Laurence Spates, MD;  Location: [facility] ENDOSCOPY;  Service: Gastroenterology;  Laterality: N/A;  . EYE SURGERY Left 1990's   "result of trauma"   . FACIAL FRACTURE SURGERY Left 1990's   "result of trauma"   . FRACTURE SURGERY    . HERNIA REPAIR    . LEFT HEART CATHETERIZATION WITH CORONARY ANGIOGRAM Right 03/07/2013   Procedure: LEFT HEART CATHETERIZATION WITH CORONARY ANGIOGRAM;  Surgeon: [name], MD;  Location: [facility] CATH LAB;  Service: Cardiovascular;  Laterality: Right;  . UMBILICAL HERNIA REPAIR         Family History  Problem Relation Age of Onset  . Hypertension Mother   . Cirrhosis Mother   . Alcoholism Mother   . Hypertension Father   . Melanoma Father   . Hypertension Other   . Coronary artery disease Other     Social History   Tobacco Use  . Smoking status: Current Every Day Smoker    Packs/day: 1.00    Years: 33.00    Pack years: 33.00    Types: Cigarettes, E-cigarettes  . Smokeless tobacco: Never Used  Substance Use Topics  . Alcohol use: Yes  . Drug use: Yes    Types: Marijuana, Cocaine    Comment: daily marijuana use; last cocaine use  about 3 months ago    Home Medications Prior to Admission medications   Medication Sig Start Date End Date Taking? Authorizing Provider  acetaminophen (TYLENOL) 500 MG tablet Take 1,000 mg by mouth every 6 (six) hours as needed for mild pain.   Yes [provider]  Cyanocobalamin (VITAMIN B-12 PO) Take 1 tablet by mouth daily.   Yes [provider]  cyclobenzaprine (FLEXERIL) 10 MG tablet Take 1 tablet (10 mg total) by mouth 2 (two) times daily as needed for muscle spasms. 03/03/19  Yes Recardo Evangelist, PA-C  famotidine (PEPCID) 20 MG tablet Take 1 tablet (20 mg total) by mouth 2 (two) times daily. 08/08/19  Yes Carlisle Cater, PA-C  folic acid (FOLVITE) 1 MG tablet Take 1 tablet (1 mg total) by mouth daily. 11/26/17  Yes [name], MD  hydrALAZINE (APRESOLINE) 25 MG tablet Take 25 mg by mouth 3 (three) times daily.    Yes [provider]  hydrOXYzine (VISTARIL) 25 MG capsule Take  25 mg by mouth 3 (three) times daily as needed for anxiety.  06/19/19  Yes [provider]  lipase/protease/amylase (CREON) 12000 units CPEP capsule Take 1 capsule (12,000 Units total) by mouth 3 (three) times daily with meals. For pancreatitis 10/15/18  Yes [name], MD  loratadine (CLARITIN) 10 MG tablet Take 1 tablet (10 mg total) by mouth daily as needed for allergies or rhinitis. (May purchase from over the counter): For allergies Patient taking differently: Take 10 mg by mouth daily. (May purchase from over the counter): For allergies 10/01/17  Yes Aline August, MD  LORazepam (ATIVAN) 0.5 MG tablet Take 0.5 mg by mouth every 8 (eight) hours as needed for anxiety.   Yes [provider]  metoprolol tartrate (LOPRESSOR) 100 MG tablet Take 100 mg by mouth 2 (two) times daily.    Yes [provider]  Multiple Vitamin (MULTIVITAMIN WITH MINERALS) TABS tablet Take 1 tablet by mouth daily. 09/06/17  Yes Bonnell Public, MD  NASONEX 50 MCG/ACT nasal spray  Place 2 sprays into the nose daily. 06/19/19  Yes [provider]  omeprazole (PRILOSEC) 20 MG capsule Take 20 mg by mouth daily. 06/19/19  Yes [provider]  ondansetron (ZOFRAN) 4 MG tablet Take 1 tablet (4 mg total) by mouth every 8 (eight) hours as needed for nausea or vomiting. 11/24/18  Yes Nuala Alpha A, PA-C  oxyCODONE-acetaminophen (PERCOCET/ROXICET) 5-325 MG tablet Take 1 tablet by mouth every 8 (eight) hours as needed for severe pain.   Yes [provider]  pantoprazole (PROTONIX) 40 MG tablet Take 1 tablet (40 mg total) by mouth daily. 08/08/19  Yes Carlisle Cater, PA-C  potassium chloride SA (K-DUR) 20 MEQ tablet Take 1 tablet (20 mEq total) by mouth daily. Patient taking differently: Take 20 mEq by mouth daily as needed (potassium).  01/07/19  Yes Sherwood Gambler, MD  QUEtiapine (SEROQUEL) 100 MG tablet Take 100 mg by mouth at bedtime.   Yes [provider]  sertraline (ZOLOFT) 100 MG tablet Take 1 tablet (100 mg total) by mouth daily. Patient taking differently: Take 150 mg by mouth daily.  08/29/17  Yes Clent Demark, PA-C  sucralfate (CARAFATE) 1 g tablet Take 1 tablet (1 g total) by mouth 4 (four) times daily -  with meals and at bedtime. 08/08/19  Yes Carlisle Cater, PA-C  thiamine 100 MG tablet Take 1 tablet (100 mg total) by mouth daily. 10/16/18  Yes [name], MD  amitriptyline (ELAVIL) 25 MG tablet Take 1 tablet (25 mg total) by mouth at bedtime. Patient not taking: Reported on 08/08/2019 10/15/18 08/08/19  [name], MD  gabapentin (NEURONTIN) 100 MG capsule Take 1 capsule (100 mg total) by mouth 2 (two) times daily. Patient not taking: Reported on 08/08/2019 10/15/18 08/08/19  [name], MD  promethazine (PHENERGAN) 25 MG tablet Take 1 tablet (25 mg total) by mouth every 6 (six) hours as needed for nausea or vomiting. Patient not taking: Reported on 08/08/2019 03/02/19 08/08/19  Kinnie Feil, PA-C    Allergies    Robaxin  [methocarbamol], Shellfish-derived products, Trazodone, Trazodone and nefazodone, Adhesive [tape], Latex, Toradol [ketorolac tromethamine], Contrast media [iodinated diagnostic agents], and Reglan [metoclopramide]  Review of Systems   Review of Systems  Constitutional: Negative for chills and fever.  HENT: Negative for ear pain and sore throat.   Eyes: Negative for photophobia and visual disturbance.  Respiratory: Positive for shortness of breath. Negative for cough.   Cardiovascular: Positive  for chest pain. Negative for palpitations.  Gastrointestinal: Positive for abdominal pain and nausea. Negative for vomiting.  Genitourinary: Negative for dysuria and hematuria.  Musculoskeletal: Positive for arthralgias, myalgias and neck pain.  Skin: Negative for color change and rash.  Neurological: Negative for syncope and light-headedness.  All other systems reviewed and are negative.   Physical Exam Updated Vital Signs BP (!) 119/91 (BP Location: Right Arm)   Pulse 97   Temp 98.7 F (37.1 C) (Oral)   Resp 18   Ht 5\' 8"  (1.727 m)   Wt 52.4 kg   SpO2 100%   BMI 17.56 kg/m   Physical Exam Vitals and nursing note reviewed.  Constitutional:      Comments: Thin male, wincing in pain  HENT:     Head: Normocephalic and atraumatic.  Eyes:     Conjunctiva/sclera: Conjunctivae normal.  Cardiovascular:     Rate and Rhythm: Normal rate and regular rhythm.     Heart sounds: No murmur.  Pulmonary:     Effort: Pulmonary effort is normal. No respiratory distress.     Breath sounds: Normal breath sounds.  Abdominal:     Palpations: Abdomen is soft.     Tenderness: There is abdominal tenderness in the epigastric area. There is no guarding or rebound. Negative signs include Murphy's sign.  Musculoskeletal:     Cervical back: Neck supple.     Right lower leg: No edema.     Left lower leg: No edema.  Skin:    General: Skin is warm and dry.  Neurological:     General: No focal deficit  present.     Mental Status: He is alert and oriented to person, place, and time.     ED Results / Procedures / Treatments   Labs (all labs ordered are listed, but only abnormal results are displayed) Labs Reviewed  BASIC METABOLIC PANEL - Abnormal; Notable for the following components:      Result Value   Sodium 134 (*)    CO2 20 (*)    Creatinine, Ser 0.59 (*)    All other components within normal limits  CBC - Abnormal; Notable for the following components:   RBC 3.56 (*)    Hemoglobin 11.2 (*)    HCT 35.4 (*)    All other components within normal limits  RAPID URINE DRUG SCREEN, HOSP PERFORMED - Abnormal; Notable for the following components:   Tetrahydrocannabinol POSITIVE (*)    All other components within normal limits  SARS CORONAVIRUS 2 (TAT 6-24 HRS)  HEPATIC FUNCTION PANEL  LIPASE, BLOOD  TROPONIN I (HIGH SENSITIVITY)  TROPONIN I (HIGH SENSITIVITY)  TROPONIN I (HIGH SENSITIVITY)    EKG EKG Interpretation  Date/Time:  Saturday August 24 2019 14:08:16 EDT Ventricular Rate:  104 PR Interval:  124 QRS Duration: 78 QT Interval:  288 QTC Calculation: 378 R Axis:   78 Text Interpretation: Sinus tachycardia Biatrial enlargement T wave abnormality, consider inferior ischemia T wave abnormality, consider anterolateral ischemia Abnormal ECG No STEMI, new T wave inversions in inferior and lateral leads, possible biphasic pattern in V3 Confirmed by Octaviano Glow [id number]) on 08/24/2019 4:49:34 PM   Radiology DG Chest 2 View  Result Date: 08/25/2019 CLINICAL DATA:  Chest and abdominal pain EXAM: CHEST - 2 VIEW COMPARISON:  03/02/2019 FINDINGS: Frontal and lateral views of the chest demonstrate an unremarkable cardiac silhouette. No airspace disease, effusion, or pneumothorax. No acute bony abnormalities. IMPRESSION: 1. No acute intrathoracic process. Electronically Signed   By:  Randa Ngo M.D.   On: 08/25/2019 00:26   DG Abd 1 View  Result Date: 08/25/2019 CLINICAL  DATA:  Abdominal pain, history of peptic ulcer disease and chronic pancreatitis EXAM: ABDOMEN - 1 VIEW COMPARISON:  08/08/2019 FINDINGS: Supine frontal view of the abdomen and pelvis excludes the hemidiaphragms by collimation. Bowel gas pattern is unremarkable without obstruction or ileus. No masses or abnormal calcifications. No acute bony abnormality. IMPRESSION: 1. Unremarkable bowel gas pattern. Electronically Signed   By: Randa Ngo M.D.   On: 08/25/2019 00:30    Procedures Procedures (including critical care time)  Medications Ordered in ED Medications  sodium chloride flush (NS) 0.9 % injection 3 mL (3 mLs Intravenous Not Given 08/24/19 1756)  nitroGLYCERIN (NITROSTAT) SL tablet 0.4 mg (0.4 mg Sublingual Given 08/24/19 1713)  LORazepam (ATIVAN) tablet 1-4 mg ( Oral See Alternative 08/25/19 0953)    Or  LORazepam (ATIVAN) injection 1-4 mg (4 mg Intravenous Given 08/25/19 0953)  thiamine tablet 100 mg ( Oral See Alternative 08/25/19 0958)    Or  thiamine (B-1) injection 100 mg (100 mg Intravenous Given 08/25/19 0958)  multivitamin with minerals tablet 1 tablet (1 tablet Oral Not Given 08/25/19 0918)  acetaminophen (TYLENOL) tablet 1,000 mg (has no administration in time range)  ondansetron (ZOFRAN) injection 4 mg (has no administration in time range)  0.9 %  sodium chloride infusion ( Intravenous New Bag/Given 08/25/19 1030)  morphine 2 MG/ML injection 2 mg (2 mg Intravenous Given 08/25/19 0150)  pantoprazole (PROTONIX) injection 40 mg (40 mg Intravenous Given 08/25/19 0957)  hydrALAZINE (APRESOLINE) tablet 25 mg (25 mg Oral Not Given 08/25/19 1009)  lipase/protease/amylase (CREON) capsule 12,000 Units (12,000 Units Oral Not Given 08/25/19 0818)  metoprolol tartrate (LOPRESSOR) tablet 100 mg (100 mg Oral Not Given 08/25/19 1027)  QUEtiapine (SEROQUEL) tablet 100 mg (100 mg Oral Given 08/25/19 0048)  sertraline (ZOLOFT) tablet 150 mg (150 mg Oral Not Given 08/25/19 1006)  sucralfate (CARAFATE)  tablet 1 g (1 g Oral Not Given 08/25/19 0918)  hydrOXYzine (ATARAX/VISTARIL) tablet 25 mg (has no administration in time range)  folic acid injection 1 mg (1 mg Intravenous Given 08/25/19 0958)  LORazepam (ATIVAN) injection 1 mg (1 mg Intravenous Given 08/25/19 1101)  alum & mag hydroxide-simeth (MAALOX/MYLANTA) 200-200-20 MG/5ML suspension 30 mL (30 mLs Oral Given 08/24/19 1802)    And  lidocaine (XYLOCAINE) 2 % viscous mouth solution 15 mL (15 mLs Oral Given 08/24/19 1802)  famotidine (PEPCID) tablet 20 mg (20 mg Oral Given 08/24/19 1802)  alum & mag hydroxide-simeth (MAALOX/MYLANTA) 200-200-20 MG/5ML suspension 30 mL (30 mLs Oral Given 08/25/19 0042)    And  lidocaine (XYLOCAINE) 2 % viscous mouth solution 15 mL (15 mLs Oral Given 08/25/19 0042)  haloperidol lactate (HALDOL) injection 5 mg (5 mg Intravenous Given 08/25/19 0351)    ED Course  I have reviewed the triage vital signs and the nursing notes.  Pertinent labs & imaging results that were available during my care of the patient were reviewed by me and considered in my medical decision making (see chart for details).  This patient complains of chest pain, left sided jaw pain, and epigastric pain.  This involves an extensive number of treatment options, and is a complaint that carries with it a high risk of complications and morbidity.  The differential diagnosis includes ACS vs. Pancreatitis vs. Reflux vs gastric ulcer vs. Esophagitis vs. Other  Epigastric pain is strongly consistent with his reported hx of gastric ulcer and pancreatitis.  He continues to drink etoh daily which is an irritant to his stomach lining, and raises concern for pancreatitis.  Labs are pending.  His chest pain he reports is unusual, particularly with radiation into his left neck.  He has been seen in the ED multiple times for chest pain, but has not had a significant cardiac evaluation aside from echocardiogram in 2019 (with EF 60-65%, no sig regurg,no wall motion  abnormalities).  I am concerned that his ECG today shows T-wave inversions diffusely which are new from his April 1st ecg.  Prior ecgs showed these similar inversions, however.  The concern is for dynamic changes. Otherwise, his trops are flat, and his chest pain did not improve with SL nitro.  His UDS is negative for cocaine (he has used this in the past). Based on the ECG tracing, I would admit for another echocardiogram and observation overnight for his chest pain.   With regards to his epigastric pain, I explained that he will continue having these symptoms as long as he drinks.  We'll start a CIWA protocol here.  Lower suspicion for aortic dissection given the patient's description that this is "exactly" like his prior gastritis and gastric ulcer pain.  I ordered, reviewed, and interpreted labs, which included cardiac enzymes, BMP, LFT, Lipase I ordered medication SL nitro and GI cocktail for chest pain and epigastric pai Previous records obtained and reviewed showing prior ED visits, echocardiogram from 2019 I consulted cardiology and discussed lab and imaging findings  After the interventions stated above, I reevaluated the patient and found patient reporting only minimal improvement after GI cocktail, no change after SL nitro.  Feels like he is beginning to withdraw from etoh.  Plan to admit.  Clinical Course as of Aug 25 1127  Sat Aug 24, 2019  1756 Patient is still in pain despite sublingual nitro.  Suspect this may be more related to his chronic pancreatitis.  We will try GI cocktail now.   [MT]  2031 Awaiting cardiology callback   [MT]  2227 Continue to have chest pain.  I did speak to Dr. Radford Pax the cardiologist, who reviewed prior EKGs from the patient and noted that he has had the same T wave inversions in the past.  Review noted did not have these T wave inversions in April 1st 2021.  I am concerned these may be dynamic changes therefore.  He be reasonable to admit him for cardiac  work-up including a repeat echocardiogram has not had one since 2019.  Also will initiate alcohol withdrawal protocol as the patient appears to be developing symptoms of withdrawal.   [MT]  2301 Signed out to hospitalist dr patel, planning obs admission and echo in the morning   [MT]    Clinical Course User Index [MT] Kayli Beal, Carola Rhine, MD    Final Clinical Impression(s) / ED Diagnoses Final diagnoses:  Abdominal pain    Rx / DC Orders ED Discharge Orders    None       Wyvonnia Dusky, MD 08/25/19 1129

## 2019-08-24 NOTE — ED Notes (Signed)
Pt left room to hand keys to daughter in the lobby. States he will return to his room.

## 2019-08-24 NOTE — ED Triage Notes (Signed)
Pt to triage via [facility] from home.  Reports sudden onset of chest pain to center of chest that intermittently radiates to jaw and abd.  Reports vomiting prior to onset of chest pain.  Drank ETOH yesterday.

## 2019-08-24 NOTE — ED Notes (Signed)
Pt up to BR without need for (A) or distress noted.  States his pain continues to come intermittently to L side neck.  [facility] RN report given.

## 2019-08-24 NOTE — H&P (Signed)
History and Physical    [name] [id number] DOB: Jul 31, 1970 DOA: 08/24/2019  PCP: Marliss Coots, NP  Patient coming from: Home via EMS  I have personally briefly reviewed patient's old medical records in [place]  Chief Complaint: Chest/abdominal pain  HPI: [name] is a 49 y.o. male with medical history significant for alcohol use disorder, chronic pancreatitis, hypertension, GERD with history of gastritis and peptic ulcer disease, mood disorder, and substance use disorder who presents to the ED for evaluation of chest and abdominal pain.  Patient states yesterday he went to a funeral of his close friend.  He says he normally drinks a sixpack of beer daily but has not had any alcohol for the last 2 weeks.  Since his friend passed and he was with other friends he did start drinking again.  He says last night he took 2 or 3 maximum strength ibuprofen.  Around 2 PM earlier today (08/24/2019) he had sudden onset of left-sided chest pain which he says radiated up to his left shoulder, left neck, and jaw.  He says the pain felt like a pressure sensation.  He also reports epigastric and stomach pain with heartburn.  He says this feels similar to his prior stomach ulcers.  He has been taking Pepcid and Protonix daily.  He says he did have an episode of bilious emesis earlier today.  He has had subsequent nausea without further emesis.  He reports having semisoft loose stool as well.  He has a past history of cocaine use but none denies any recent use.  He reports marijuana use.  He denies any IV drug use.  ED Course:  Initial vitals showed BP 148/99, pulse 108, RR 18, temp 98.6 Fahrenheit, SPO2 100% on room air.  Labs notable for sodium 134, potassium 4.8, bicarb 20, BUN 7, creatinine 0.59, WBC 8.1, hemoglobin 11.2, platelets 310,000, high-sensitivity troponin I 4x2, lipase 19, hepatic function panel within normal limits.  UDS is positive for THC.  SARS-CoV-2  PCR is obtained and pending.  Patient was given sublingual nitroglycerin, Pepcid, and GI cocktail.  He was placed on CIWA protocol with as needed Ativan.  EDP discussed with on-call cardiology who suggested no immediate cardiac evaluation but recommended observation with repeat echocardiogram.  The hospitalist service was consulted to admit for further evaluation and management.  Review of Systems: All systems reviewed and are negative except as documented in history of present illness above.   Past Medical History:  Diagnosis Date  . Alcoholism /alcohol abuse ([place])   . Anemia   . Anxiety   . Arthritis    "knees; arms; elbows" (03/26/2015)  . Asthma   . Bipolar disorder ([place])   . Chronic bronchitis ([place])   . Chronic lower back pain   . Chronic pancreatitis ([place])   . Cocaine abuse ([place])   . Depression   . Family history of adverse reaction to anesthesia    "grandmother gets confused"  . Femoral condyle fracture ([place]) 03/08/2014   left medial/notes 03/09/2014  . GERD (gastroesophageal reflux disease)   . H/O hiatal hernia   . H/O suicide attempt 10/2012  . High cholesterol   . History of blood transfusion 10/2012   "when I tried to commit suicide"  . History of stomach ulcers   . Hypertension   . Marijuana abuse, continuous   . Migraine    "a few times/year" (03/26/2015)  . Pneumonia 1990's X 3  . PTSD (post-traumatic stress disorder)   .  Sickle cell trait ([place])   . WPW (Wolff-Parkinson-White syndrome)    [name] 03/06/2013    Past Surgical History:  Procedure Laterality Date  . BIOPSY  11/25/2017   Procedure: BIOPSY;  Surgeon: Arta Silence, MD;  Location: [facility] ENDOSCOPY;  Service: Endoscopy;;  . BIOPSY  10/14/2018   Procedure: BIOPSY;  Surgeon: Arta Silence, MD;  Location: [facility];  Service: Endoscopy;;  . CARDIAC CATHETERIZATION    . ESOPHAGOGASTRODUODENOSCOPY (EGD) WITH PROPOFOL N/A 11/25/2017   Procedure: ESOPHAGOGASTRODUODENOSCOPY (EGD) WITH PROPOFOL;  Surgeon:  Arta Silence, MD;  Location: [place];  Service: Endoscopy;  Laterality: N/A;  . ESOPHAGOGASTRODUODENOSCOPY (EGD) WITH PROPOFOL Left 10/14/2018   Procedure: ESOPHAGOGASTRODUODENOSCOPY (EGD) WITH PROPOFOL;  Surgeon: Arta Silence, MD;  Location: [facility] ENDOSCOPY;  Service: Endoscopy;  Laterality: Left;  . ESOPHAGOGASTRODUODENOSCOPY (EGD) WITH PROPOFOL N/A 11/14/2018   Procedure: ESOPHAGOGASTRODUODENOSCOPY (EGD) WITH PROPOFOL;  Surgeon: Laurence Spates, MD;  Location: [facility] ENDOSCOPY;  Service: Gastroenterology;  Laterality: N/A;  . EYE SURGERY Left 1990's   "result of trauma"   . FACIAL FRACTURE SURGERY Left 1990's   "result of trauma"   . FRACTURE SURGERY    . HERNIA REPAIR    . LEFT HEART CATHETERIZATION WITH CORONARY ANGIOGRAM Right 03/07/2013   Procedure: LEFT HEART CATHETERIZATION WITH CORONARY ANGIOGRAM;  Surgeon: [name], MD;  Location: [facility] CATH LAB;  Service: Cardiovascular;  Laterality: Right;  . UMBILICAL HERNIA REPAIR      Social History:  reports that he has been smoking cigarettes and e-cigarettes. He has a 33.00 pack-year smoking history. He has never used smokeless tobacco. He reports current alcohol use. He reports current drug use. Drugs: Marijuana and Cocaine.  Allergies  Allergen Reactions  . Robaxin [Methocarbamol] Other (See Comments)    "jumpy limbs"  . Shellfish-Derived Products Nausea And Vomiting  . Trazodone Other (See Comments)    Muscle spasms  . Trazodone And Nefazodone Other (See Comments)    Muscle spasms  . Adhesive [Tape] Itching  . Latex Itching  . Toradol [Ketorolac Tromethamine] Other (See Comments)    Has ulcers; cannot have this  . Contrast Media [Iodinated Diagnostic Agents] Hives  . Reglan [Metoclopramide] Other (See Comments)    Muscle spasms    Family History  Problem Relation Age of Onset  . Hypertension Mother   . Cirrhosis Mother   . Alcoholism Mother   . Hypertension Father   . Melanoma Father   . Hypertension Other   .  Coronary artery disease Other      Prior to Admission medications   Medication Sig Start Date End Date Taking? Authorizing Provider  acetaminophen (TYLENOL) 500 MG tablet Take 1,000 mg by mouth every 6 (six) hours as needed for mild pain.   Yes [provider]  Cyanocobalamin (VITAMIN B-12 PO) Take 1 tablet by mouth daily.   Yes [provider]  cyclobenzaprine (FLEXERIL) 10 MG tablet Take 1 tablet (10 mg total) by mouth 2 (two) times daily as needed for muscle spasms. 03/03/19  Yes Recardo Evangelist, PA-C  famotidine (PEPCID) 20 MG tablet Take 1 tablet (20 mg total) by mouth 2 (two) times daily. 08/08/19  Yes Carlisle Cater, PA-C  folic acid (FOLVITE) 1 MG tablet Take 1 tablet (1 mg total) by mouth daily. 11/26/17  Yes [name], MD  hydrALAZINE (APRESOLINE) 25 MG tablet Take 25 mg by mouth 3 (three) times daily.    Yes [provider]  hydrOXYzine (VISTARIL) 25 MG capsule Take 25 mg by mouth  3 (three) times daily as needed for anxiety.  06/19/19  Yes [provider]  lipase/protease/amylase (CREON) 12000 units CPEP capsule Take 1 capsule (12,000 Units total) by mouth 3 (three) times daily with meals. For pancreatitis 10/15/18  Yes [name], MD  loratadine (CLARITIN) 10 MG tablet Take 1 tablet (10 mg total) by mouth daily as needed for allergies or rhinitis. (May purchase from over the counter): For allergies Patient taking differently: Take 10 mg by mouth daily. (May purchase from over the counter): For allergies 10/01/17  Yes Aline August, MD  LORazepam (ATIVAN) 0.5 MG tablet Take 0.5 mg by mouth every 8 (eight) hours as needed for anxiety.   Yes [provider]  metoprolol tartrate (LOPRESSOR) 100 MG tablet Take 100 mg by mouth 2 (two) times daily.    Yes [provider]  Multiple Vitamin (MULTIVITAMIN WITH MINERALS) TABS tablet Take 1 tablet by mouth daily. 09/06/17  Yes Bonnell Public, MD  NASONEX 50 MCG/ACT nasal spray  Place 2 sprays into the nose daily. 06/19/19  Yes [provider]  omeprazole (PRILOSEC) 20 MG capsule Take 20 mg by mouth daily. 06/19/19  Yes [provider]  ondansetron (ZOFRAN) 4 MG tablet Take 1 tablet (4 mg total) by mouth every 8 (eight) hours as needed for nausea or vomiting. 11/24/18  Yes Nuala Alpha A, PA-C  oxyCODONE-acetaminophen (PERCOCET/ROXICET) 5-325 MG tablet Take 1 tablet by mouth every 8 (eight) hours as needed for severe pain.   Yes [provider]  pantoprazole (PROTONIX) 40 MG tablet Take 1 tablet (40 mg total) by mouth daily. 08/08/19  Yes Carlisle Cater, PA-C  potassium chloride SA (K-DUR) 20 MEQ tablet Take 1 tablet (20 mEq total) by mouth daily. Patient taking differently: Take 20 mEq by mouth daily as needed (potassium).  01/07/19  Yes Sherwood Gambler, MD  QUEtiapine (SEROQUEL) 100 MG tablet Take 100 mg by mouth at bedtime.   Yes [provider]  sertraline (ZOLOFT) 100 MG tablet Take 1 tablet (100 mg total) by mouth daily. Patient taking differently: Take 150 mg by mouth daily.  08/29/17  Yes Clent Demark, PA-C  sucralfate (CARAFATE) 1 g tablet Take 1 tablet (1 g total) by mouth 4 (four) times daily -  with meals and at bedtime. 08/08/19  Yes Carlisle Cater, PA-C  thiamine 100 MG tablet Take 1 tablet (100 mg total) by mouth daily. 10/16/18  Yes [name], MD  amitriptyline (ELAVIL) 25 MG tablet Take 1 tablet (25 mg total) by mouth at bedtime. Patient not taking: Reported on 08/08/2019 10/15/18 08/08/19  [name], MD  gabapentin (NEURONTIN) 100 MG capsule Take 1 capsule (100 mg total) by mouth 2 (two) times daily. Patient not taking: Reported on 08/08/2019 10/15/18 08/08/19  [name], MD  promethazine (PHENERGAN) 25 MG tablet Take 1 tablet (25 mg total) by mouth every 6 (six) hours as needed for nausea or vomiting. Patient not taking: Reported on 08/08/2019 03/02/19 08/08/19  Kinnie Feil, PA-C    Physical Exam: Vitals:    08/24/19 1700 08/24/19 1715 08/24/19 1845 08/24/19 2045  BP: (!) 156/111 (!) 147/107 (!) 146/104 (!) 149/105  Pulse: (!) 108 (!) 114 (!) 107 (!) 106  Resp: 20 15 (!) 22 19  Temp:      TempSrc:      SpO2: 100% 100% 100% 99%  Weight:      Height:       Constitutional: Resting supine in bed with  head elevated, NAD, calm, intermittent spells of discomfort due to chest/abdominal pain Eyes: PERRL, lids and conjunctivae normal ENMT: Mucous membranes are moist. Posterior pharynx clear of any exudate or lesions. Neck: normal, supple, no masses. Respiratory: clear to auscultation bilaterally, no wheezing, no crackles. Normal respiratory effort. No accessory muscle use.  Cardiovascular: Tachycardic with rhythm, no murmurs / rubs / gallops. No extremity edema. 2+ pedal pulses. Abdomen: Mild epigastric tenderness, no masses palpated. No hepatosplenomegaly. Bowel sounds positive.  Musculoskeletal: no clubbing / cyanosis. No joint deformity upper and lower extremities. Good ROM, no contractures. Normal muscle tone.  No reproducible chest wall tenderness. Skin: no rashes, lesions, ulcers. No induration Neurologic: CN 2-12 grossly intact. Sensation intact, DTR normal. Strength 5/5 in all 4.  Psychiatric: Normal judgment and insight. Alert and oriented x 3. Normal mood.     Labs on Admission: I have personally reviewed following labs and imaging studies  CBC: Recent Labs  Lab 08/24/19 1412  WBC 8.0  HGB 11.2*  HCT 35.4*  MCV 99.4  PLT 99991111   Basic Metabolic Panel: Recent Labs  Lab 08/24/19 1412  NA 134*  K 4.8  CL 102  CO2 20*  GLUCOSE 86  BUN 7  CREATININE 0.59*  CALCIUM 9.9   GFR: Estimated Creatinine Clearance: 87.2 mL/min (A) (by C-G formula based on SCr of 0.59 mg/dL (L)). Liver Function Tests: Recent Labs  Lab 08/24/19 1752  AST 38  ALT 31  ALKPHOS 82  BILITOT 0.6  PROT 7.4  ALBUMIN 3.5   Recent Labs  Lab 08/24/19 1752  LIPASE 19   No results for input(s):  AMMONIA in the last 168 hours. Coagulation Profile: No results for input(s): INR, PROTIME in the last 168 hours. Cardiac Enzymes: No results for input(s): CKTOTAL, CKMB, CKMBINDEX, TROPONINI in the last 168 hours. BNP (last 3 results) No results for input(s): PROBNP in the last 8760 hours. HbA1C: No results for input(s): HGBA1C in the last 72 hours. CBG: No results for input(s): GLUCAP in the last 168 hours. Lipid Profile: No results for input(s): CHOL, HDL, LDLCALC, TRIG, CHOLHDL, LDLDIRECT in the last 72 hours. Thyroid Function Tests: No results for input(s): TSH, T4TOTAL, FREET4, T3FREE, THYROIDAB in the last 72 hours. Anemia Panel: No results for input(s): VITAMINB12, FOLATE, FERRITIN, TIBC, IRON, RETICCTPCT in the last 72 hours. Urine analysis:    Component Value Date/Time   COLORURINE YELLOW 03/02/2019 1000   APPEARANCEUR CLEAR 03/02/2019 1000   LABSPEC 1.017 03/02/2019 1000   PHURINE 5.0 03/02/2019 1000   GLUCOSEU NEGATIVE 03/02/2019 1000   HGBUR NEGATIVE 03/02/2019 1000   HGBUR negative 04/30/2010 1020   BILIRUBINUR NEGATIVE 03/02/2019 1000   KETONESUR 5 (A) 03/02/2019 1000   PROTEINUR NEGATIVE 03/02/2019 1000   UROBILINOGEN 0.2 03/15/2015 0508   NITRITE NEGATIVE 03/02/2019 1000   LEUKOCYTESUR NEGATIVE 03/02/2019 1000    Radiological Exams on Admission: No results found.  EKG: Independently reviewed.  Sinus tachycardia, TWI inferolateral leads.  T wave changes new when compared to prior EKG from 08/08/2019, but have been seen on prior EKGs from 2020.  Assessment/Plan Principal Problem:   Chest pain Active Problems:   Essential hypertension   Mood disorder (HCC)   Chronic pancreatitis (HCC)   Alcohol use disorder, severe, dependence ([place])  Elijiah Melrose Sandgren is a 49 y.o. male with medical history significant for alcohol use disorder, chronic pancreatitis, hypertension, GERD with history of gastritis and peptic ulcer disease, mood disorder, and substance use  disorder who is admitted for  chest pain rule out and gastritis.  Chest pain: Patient with typical type chest pain.  EKG shows inferolateral T wave inversions which are new compared to prior from 08/08/2019 have been seen on multiple prior EKGs from last year.  Troponin is negative x2.  He did have a cardiac catheterization in 2014 which showed normal coronary arteries. -Cycle cardiac enzymes -Monitor on telemetry -Obtain chest x-ray -Update echocardiogram -EKG as needed -Sublingual nitroglycerin as needed -Hold aspirin for now given likely gastritis as well  Abdominal pain GERD with history of gastritis and peptic ulcer disease: Patient also with symptoms consistent with gastritis in the setting of alcohol and NSAID use.  He does have a history of peptic ulcer disease.  Last upper endoscopy 11/14/2018 showed complete healing of previously seen ulcers. -Start IV Protonix 40 mg twice daily -Start IV fluid hydration overnight -Hold aspirin and NSAIDs -Give additional GI cocktail -Continue sucralfate -Check abdominal x-ray  Alcohol use disorder: Reports ongoing alcohol use with history of withdrawal. -Continue CIWA protocol with as needed Ativan  Chronic pancreatitis: May be contributing to abdominal discomfort but seems primarily driven by gastritis. -Continue Creon with meals  Hypertension: Resume home hydralazine and Lopressor.  Mood disorder: Continue Seroquel, sertraline, and as needed hydroxyzine.  Substance use disorder: History of cocaine use, UDS negative for cocaine this admission. THC is positive.  He does seem to exhibit some narcotic seeking behavior.  DVT prophylaxis: SCDs Code Status: Full code, confirmed with patient Family Communication: Discussed with patient, he has discussed with family Disposition Plan: From home, likely discharge to home Consults called: EDP discussed with on-call cardiology Admission status:  Status is: Observation  The patient remains  OBS appropriate and will d/c before 2 midnights.  Dispo: The patient is from: Home              Anticipated d/c is to: Home              Anticipated d/c date is: 1 day              Patient currently is not medically stable to d/c.   Zada Finders MD [facility]  If 7PM-7AM, please contact night-coverage [email or web address]  08/24/2019, 11:16 PM

## 2019-08-24 NOTE — ED Notes (Signed)
Pt returned to room  

## 2019-08-25 ENCOUNTER — Observation Stay (HOSPITAL_COMMUNITY): Payer: Medicaid Other

## 2019-08-25 DIAGNOSIS — Z91041 Radiographic dye allergy status: Secondary | ICD-10-CM | POA: Diagnosis not present

## 2019-08-25 DIAGNOSIS — K861 Other chronic pancreatitis: Secondary | ICD-10-CM

## 2019-08-25 DIAGNOSIS — Z8701 Personal history of pneumonia (recurrent): Secondary | ICD-10-CM | POA: Diagnosis not present

## 2019-08-25 DIAGNOSIS — F17210 Nicotine dependence, cigarettes, uncomplicated: Secondary | ICD-10-CM | POA: Diagnosis not present

## 2019-08-25 DIAGNOSIS — R0789 Other chest pain: Secondary | ICD-10-CM | POA: Diagnosis not present

## 2019-08-25 DIAGNOSIS — K2970 Gastritis, unspecified, without bleeding: Secondary | ICD-10-CM | POA: Diagnosis not present

## 2019-08-25 DIAGNOSIS — Z811 Family history of alcohol abuse and dependence: Secondary | ICD-10-CM | POA: Diagnosis not present

## 2019-08-25 DIAGNOSIS — Z8249 Family history of ischemic heart disease and other diseases of the circulatory system: Secondary | ICD-10-CM | POA: Diagnosis not present

## 2019-08-25 DIAGNOSIS — K219 Gastro-esophageal reflux disease without esophagitis: Secondary | ICD-10-CM | POA: Diagnosis not present

## 2019-08-25 DIAGNOSIS — F1290 Cannabis use, unspecified, uncomplicated: Secondary | ICD-10-CM | POA: Diagnosis not present

## 2019-08-25 DIAGNOSIS — R109 Unspecified abdominal pain: Secondary | ICD-10-CM | POA: Diagnosis present

## 2019-08-25 DIAGNOSIS — K860 Alcohol-induced chronic pancreatitis: Secondary | ICD-10-CM | POA: Diagnosis not present

## 2019-08-25 DIAGNOSIS — F1990 Other psychoactive substance use, unspecified, uncomplicated: Secondary | ICD-10-CM

## 2019-08-25 DIAGNOSIS — Z808 Family history of malignant neoplasm of other organs or systems: Secondary | ICD-10-CM | POA: Diagnosis not present

## 2019-08-25 DIAGNOSIS — D573 Sickle-cell trait: Secondary | ICD-10-CM | POA: Diagnosis not present

## 2019-08-25 DIAGNOSIS — I10 Essential (primary) hypertension: Secondary | ICD-10-CM | POA: Diagnosis not present

## 2019-08-25 DIAGNOSIS — F1020 Alcohol dependence, uncomplicated: Secondary | ICD-10-CM

## 2019-08-25 DIAGNOSIS — F10239 Alcohol dependence with withdrawal, unspecified: Secondary | ICD-10-CM | POA: Diagnosis not present

## 2019-08-25 DIAGNOSIS — Z8711 Personal history of peptic ulcer disease: Secondary | ICD-10-CM | POA: Diagnosis not present

## 2019-08-25 DIAGNOSIS — Z20822 Contact with and (suspected) exposure to covid-19: Secondary | ICD-10-CM | POA: Diagnosis not present

## 2019-08-25 DIAGNOSIS — F10231 Alcohol dependence with withdrawal delirium: Secondary | ICD-10-CM

## 2019-08-25 DIAGNOSIS — E876 Hypokalemia: Secondary | ICD-10-CM | POA: Diagnosis not present

## 2019-08-25 LAB — SARS CORONAVIRUS 2 (TAT 6-24 HRS): SARS Coronavirus 2: NEGATIVE

## 2019-08-25 LAB — TROPONIN I (HIGH SENSITIVITY): Troponin I (High Sensitivity): 6 ng/L (ref <18)

## 2019-08-25 MED ORDER — METOPROLOL TARTRATE 5 MG/5ML IV SOLN: 5.0000 mg | Freq: Four times a day (QID) | INTRAVENOUS | Status: DC | PRN

## 2019-08-25 MED ORDER — HYDROXYZINE HCL 25 MG PO TABS
25.0000 mg | ORAL_TABLET | Freq: Three times a day (TID) | ORAL | Status: DC | PRN
  Administered 2019-08-29: 25 mg via ORAL
  Filled 2019-08-25: qty 1

## 2019-08-25 MED ORDER — LORAZEPAM 2 MG/ML IJ SOLN
1.0000 mg | Freq: Four times a day (QID) | INTRAMUSCULAR | Status: DC
  Administered 2019-08-25: 11:00:00 1 mg via INTRAVENOUS
  Filled 2019-08-25 (×2): qty 1

## 2019-08-25 MED ORDER — HALOPERIDOL LACTATE 5 MG/ML IJ SOLN
5.0000 mg | Freq: Once | INTRAMUSCULAR | Status: AC
  Administered 2019-08-25: 5 mg via INTRAVENOUS
  Filled 2019-08-25: qty 1

## 2019-08-25 MED ORDER — FOLIC ACID 5 MG/ML IJ SOLN
1.0000 mg | Freq: Every day | INTRAMUSCULAR | Status: DC
  Administered 2019-08-25 – 2019-08-26 (×2): 1 mg via INTRAVENOUS
  Filled 2019-08-25 (×3): qty 0.2

## 2019-08-25 MED ORDER — LORAZEPAM 2 MG/ML IJ SOLN
1.0000 mg | INTRAMUSCULAR | Status: DC | PRN
  Administered 2019-08-25: 1 mg via INTRAVENOUS
  Filled 2019-08-25: qty 1

## 2019-08-25 NOTE — Significant Event (Signed)
Rapid Response Event Note  Overview: CIWA   I came by to assess patient for withdrawal, upon arrival, patient was somnolent, hard to arouse, I was to illicit a gag reflex and then the patient woke up, endorse he had a mild headache and then he fell back asleep. In 4 point restraints but does spontaneously move all extremities, pupils 2 mm and sluggish but equal. HR 80 - SR. SBP 120s, 99% on RA. RR 14-16 - periods of snoring. CIWA 11  Plan: -- HOB > 30 -  HIGH RISK FOR INTUBATION  -- Aspiration Precautions  -- Delirium Precautions -- Modified CIWA and ATIVAN orders per MD -- USE CAUTION when administering Ativan. CALL RRRN for guidance.  [name] R

## 2019-08-25 NOTE — Progress Notes (Signed)
Patient transferred to 4E02 and report given to [facility]

## 2019-08-25 NOTE — Progress Notes (Signed)
Pt more alert and waking up. Mother at bedside. Pt and mother updated on plan of care. All questions answered. Will continue to monitor.  Arletta Bale, RN

## 2019-08-25 NOTE — Progress Notes (Signed)
Ativan given due to CIWA score of 5 , new onset of agitation and confusion at 0250 am. Patient started getting progressively worse, constantly moving in the bed and restlessness noted.    CIWA score  at 0300 was 12. Additional 2 mg of ativan given as ordered. BP 141/126 pulse 88, oxygen 100% on RA, respiration 18. NP [place] paged and Rapid response RN call for evaluation.Patient assessed and evaluated by RR nurse. NP ordered 4 point restraint and haldol. Restraints applied and haldol given as ordered.

## 2019-08-25 NOTE — Progress Notes (Signed)
Patient arrived to unit from [facility]. Patient in 4 pt restraints. Patient writhing in bed. Ativan given. Unable to follow commands. CCMD notified; telemetry applied.

## 2019-08-25 NOTE — Significant Event (Signed)
Rapid Response Event Note  Overview: Called to evaluate pt for increased level of care. Pt here with chest pain/gastritis. Pt has h/o ETOH abuse. Per chart, pt says he quit drinking 2 weeks ago but attended a funeral 4/16 and began drinking again.  Pt was given 1mg  ativan in the ED prior to arriving to [facility]. Per RN, when pt got to floor, he was alert and oriented x 4, calm, and able to carry on a conversation. He then c/o pain and was given 2mg  morphine at 0150. Pt then began to get more confused, agitated, and began thrashing around bed. The RN then gave him 1mg  ativan at 0247 and an addition 2mg  at 0309.RRT saw him at 0311.   Initial Focused Assessment: Pt laying in bed, agitated. Pt will answer orientation questions appropriately but is very confused and thrashing around bed.  Pt will not allow pupils to be checked.  Pt has equal strength in all extremities. T-98, HR-82, BP-103/79, RR-20, SpO2-100% on RA. CIWA-20. Pt has no IV access at this time as it was pulled out d/t agitation.   Interventions: New PIV started 5mg  Haldol IV x 1-did not help pt agitation B soft arm and leg restraints ordered Tx to PCU  Plan of Care (if not transferred): I am not sure if this could be a reaction to the morphine administration as pt was, per RN, appropriate prior to administration or if this is ETOH withdrawal. CIWA protocol ordered but pt needs increased level of care for closer monitoring. Will transfer to PCU, give ativan based on CIWA score and continue to monitor pt. Please call RRT if further assistance needed.  Event Summary:  Kennon Holter, NP notified by RN and myself-[id number]  Called: [phone] Arrived: [phone] Ended:0510  Dillard Essex

## 2019-08-25 NOTE — ED Notes (Signed)
Report attempted x3, no response from [facility].

## 2019-08-25 NOTE — Plan of Care (Signed)

## 2019-08-25 NOTE — Procedures (Signed)
Echo attempted. Nurse suggested attempting echo again later. Will attempt again.

## 2019-08-25 NOTE — Progress Notes (Signed)
Holding off on Ativan at the moment. MD aware.

## 2019-08-25 NOTE — Progress Notes (Signed)
[name]  PROGRESS NOTE    [name]  [id number] DOB: 24-May-1970 DOA: 08/24/2019 PCP: Marliss Coots, NP   Brief Narrative:   [name] is a 49 y.o. male with medical history significant for alcohol use disorder, chronic pancreatitis, hypertension, GERD with history of gastritis and peptic ulcer disease, mood disorder, and substance use disorder who presents to the ED for evaluation of chest and abdominal pain.  Patient states yesterday he went to a funeral of his close friend.  He says he normally drinks a sixpack of beer daily but has not had any alcohol for the last 2 weeks.  Since his friend passed and he was with other friends he did start drinking again.  He says last night he took 2 or 3 maximum strength ibuprofen.  Around 2 PM earlier today (08/24/2019) he had sudden onset of left-sided chest pain which he says radiated up to his left shoulder, left neck, and jaw.  He says the pain felt like a pressure sensation.  He also reports epigastric and stomach pain with heartburn.  He says this feels similar to his prior stomach ulcers.  He has been taking Pepcid and Protonix daily.  He says he did have an episode of bilious emesis earlier today.  He has had subsequent nausea without further emesis.  He reports having semisoft loose stool as well.  He has a past history of cocaine use but none denies any recent use.  He reports marijuana use.  He denies any IV drug use.  4/18: Agitated today. Looks like EtOH withdrawal. Will schedule ativan and can continue his CIWA as well. Trp neg/flat. Echo pending. Hgb stable.    Assessment & Plan:   Principal Problem:   Chest pain Active Problems:   Essential hypertension   Mood disorder (HCC)   Chronic pancreatitis (HCC)   Alcohol use disorder, severe, dependence (HCC)  Chest pain:     - Patient with typical type chest pain.       - EKG shows inferolateral T wave inversions which are new compared to prior from 08/08/2019 have  been seen on multiple prior EKGs from last year.       - Trp are neg.       - He did have a cardiac catheterization in 2014 which showed normal coronary arteries.     - 4/18: echo pending, CXR neg  Abdominal pain GERD with history of gastritis and peptic ulcer disease:     - Patient also with symptoms consistent with gastritis in the setting of alcohol and NSAID use.     - He does have a history of peptic ulcer disease.       - Last upper endoscopy 11/14/2018 showed complete healing of previously seen ulcers.     - continue IV Protonix 40 mg twice daily, carafate     - 4/18: KUB is negative  Alcohol use disorder Alcohol withdrawal     - Reports ongoing alcohol use with history of withdrawal.     - Continue CIWA protocol with as needed Ativan     - 4/18: add scheduled ativan; may need movement to ICU for precedex if he does not improve  Chronic pancreatitis:     - May be contributing to abdominal discomfort but seems primarily driven by gastritis.     - Continue Creon with meals  Hypertension:     - Resume home hydralazine and Lopressor.     - 4/18: He is currently not  taking anything by mouth; can add PRN IV metoprolol for BP/HR control   Mood disorder:     - Continue Seroquel, sertraline, and as needed hydroxyzine.     - 4/18: resume when more alert  Substance use disorder:     - History of cocaine use, UDS negative for cocaine this admission. THC is positive.       - He does seem to exhibit some narcotic seeking behavior.     - 4/18: currently altered, likely d/t withdrawal, continue as above  DVT prophylaxis: SCDs Code Status: FULL   Status is: Observation  The patient will require care spanning > 2 midnights and should be moved to inpatient because: IV treatments appropriate due to intensity of illness or inability to take PO and Inpatient level of care appropriate due to severity of illness  Dispo: The patient is from: Home              Anticipated d/c is to:  Home              Anticipated d/c date is: > 3 days              Patient currently is not medically stable to d/c.   ROS:  Unable to obtain d/t mentation  Subjective: Rapid called for agitation last night.   Objective: Vitals:   08/25/19 0800 08/25/19 0900 08/25/19 1007 08/25/19 1200  BP: 133/81  (!) 119/91 132/76  Pulse: (!) 104 (!) 103 97 (!) 106  Resp: 20  18 20   Temp: 98.7 F (37.1 C)   98.1 F (36.7 C)  TempSrc: Oral   Axillary  SpO2: 100%  100% 100%  Weight:      Height:        Intake/Output Summary (Last 24 hours) at 08/25/2019 1230 Last data filed at 08/25/2019 0400 Gross per 24 hour  Intake 0 ml  Output --  Net 0 ml   Filed Weights   08/24/19 1406 08/25/19 0137 08/25/19 0535  Weight: 55.8 kg 55.2 kg 52.4 kg    Examination:  General: 49 y.o. male resting in bed agitated Cardiovascular: tachy, +S1, S2, no m/g/r, equal pulses throughout Respiratory: CTABL, no w/r/r, normal WOB, sonorous GI: BS+, NDNT, no masses noted, no organomegaly noted MSK: No e/c/c Neuro: somnolent, agitated  Data Reviewed: I have personally reviewed following labs and imaging studies.  CBC: Recent Labs  Lab 08/24/19 1412  WBC 8.0  HGB 11.2*  HCT 35.4*  MCV 99.4  PLT 99991111   Basic Metabolic Panel: Recent Labs  Lab 08/24/19 1412  NA 134*  K 4.8  CL 102  CO2 20*  GLUCOSE 86  BUN 7  CREATININE 0.59*  CALCIUM 9.9   GFR: Estimated Creatinine Clearance: 81.9 mL/min (A) (by C-G formula based on SCr of 0.59 mg/dL (L)). Liver Function Tests: Recent Labs  Lab 08/24/19 1752  AST 38  ALT 31  ALKPHOS 82  BILITOT 0.6  PROT 7.4  ALBUMIN 3.5   Recent Labs  Lab 08/24/19 1752  LIPASE 19   No results for input(s): AMMONIA in the last 168 hours. Coagulation Profile: No results for input(s): INR, PROTIME in the last 168 hours. Cardiac Enzymes: No results for input(s): CKTOTAL, CKMB, CKMBINDEX, TROPONINI in the last 168 hours. BNP (last 3 results) No results for  input(s): PROBNP in the last 8760 hours. HbA1C: No results for input(s): HGBA1C in the last 72 hours. CBG: No results for input(s): GLUCAP in the last 168 hours. Lipid  Profile: No results for input(s): CHOL, HDL, LDLCALC, TRIG, CHOLHDL, LDLDIRECT in the last 72 hours. Thyroid Function Tests: No results for input(s): TSH, T4TOTAL, FREET4, T3FREE, THYROIDAB in the last 72 hours. Anemia Panel: No results for input(s): VITAMINB12, FOLATE, FERRITIN, TIBC, IRON, RETICCTPCT in the last 72 hours. Sepsis Labs: No results for input(s): PROCALCITON, LATICACIDVEN in the last 168 hours.  Recent Results (from the past 240 hour(s))  SARS CORONAVIRUS 2 (TAT 6-24 HRS) Nasopharyngeal Nasopharyngeal Swab     Status: None   Collection Time: 08/24/19 11:58 PM   Specimen: Nasopharyngeal Swab  Result Value Ref Range Status   SARS Coronavirus 2 NEGATIVE NEGATIVE Final    Comment: (NOTE) SARS-CoV-2 target nucleic acids are NOT DETECTED. The SARS-CoV-2 RNA is generally detectable in upper and lower respiratory specimens during the acute phase of infection. Negative results do not preclude SARS-CoV-2 infection, do not rule out co-infections with other pathogens, and should not be used as the sole basis for treatment or other patient management decisions. Negative results must be combined with clinical observations, patient history, and epidemiological information. The expected result is Negative. Fact Sheet for Patients: SugarRoll.be Fact Sheet for Healthcare Providers: https://www.woods-mathews.com/ This test is not yet approved or cleared by the [place] FDA and  has been authorized for detection and/or diagnosis of SARS-CoV-2 by FDA under an Emergency Use Authorization (EUA). This EUA will remain  in effect (meaning this test can be used) for the duration of the COVID-19 declaration under Section 56 4(b)(1) of the Act, 21 U.S.C. section 360bbb-3(b)(1),  unless the authorization is terminated or revoked sooner. Performed at [facility], [place] [address] [place], [place] [address]       Radiology Studies: DG Chest 2 View  Result Date: 08/25/2019 CLINICAL DATA:  Chest and abdominal pain EXAM: CHEST - 2 VIEW COMPARISON:  03/02/2019 FINDINGS: Frontal and lateral views of the chest demonstrate an unremarkable cardiac silhouette. No airspace disease, effusion, or pneumothorax. No acute bony abnormalities. IMPRESSION: 1. No acute intrathoracic process. Electronically Signed   By: Randa Ngo M.D.   On: 08/25/2019 00:26   DG Abd 1 View  Result Date: 08/25/2019 CLINICAL DATA:  Abdominal pain, history of peptic ulcer disease and chronic pancreatitis EXAM: ABDOMEN - 1 VIEW COMPARISON:  08/08/2019 FINDINGS: Supine frontal view of the abdomen and pelvis excludes the hemidiaphragms by collimation. Bowel gas pattern is unremarkable without obstruction or ileus. No masses or abnormal calcifications. No acute bony abnormality. IMPRESSION: 1. Unremarkable bowel gas pattern. Electronically Signed   By: Randa Ngo M.D.   On: 08/25/2019 00:30     Scheduled Meds: . folic acid  1 mg Intravenous Daily  . hydrALAZINE  25 mg Oral TID  . lipase/protease/amylase  12,000 Units Oral TID WC  . LORazepam  1 mg Intravenous Q6H  . metoprolol tartrate  100 mg Oral BID  . multivitamin with minerals  1 tablet Oral Daily  . pantoprazole (PROTONIX) IV  40 mg Intravenous Q12H  . QUEtiapine  100 mg Oral QHS  . sertraline  150 mg Oral Daily  . sodium chloride flush  3 mL Intravenous Once  . sucralfate  1 g Oral TID WC & HS  . thiamine  100 mg Oral Daily   Or  . thiamine  100 mg Intravenous Daily   Continuous Infusions:   LOS: 0 days    Time spent: 35 minutes spent in the coordination of care today.    Jonnie Finner, DO [facility]  If 7PM-7AM, please contact night-coverage [email or web address] 08/25/2019, 12:30 PM

## 2019-08-26 ENCOUNTER — Inpatient Hospital Stay (HOSPITAL_COMMUNITY): Payer: Medicaid Other

## 2019-08-26 DIAGNOSIS — K279 Peptic ulcer, site unspecified, unspecified as acute or chronic, without hemorrhage or perforation: Secondary | ICD-10-CM

## 2019-08-26 DIAGNOSIS — K219 Gastro-esophageal reflux disease without esophagitis: Secondary | ICD-10-CM

## 2019-08-26 DIAGNOSIS — R079 Chest pain, unspecified: Secondary | ICD-10-CM

## 2019-08-26 DIAGNOSIS — F10230 Alcohol dependence with withdrawal, uncomplicated: Secondary | ICD-10-CM

## 2019-08-26 DIAGNOSIS — F39 Unspecified mood [affective] disorder: Secondary | ICD-10-CM

## 2019-08-26 LAB — COMPREHENSIVE METABOLIC PANEL
ALT: 26 U/L (ref 0–44)
AST: 46 U/L — ABNORMAL HIGH (ref 15–41)
Albumin: 3.2 g/dL — ABNORMAL LOW (ref 3.5–5.0)
Alkaline Phosphatase: 80 U/L (ref 38–126)
Anion gap: 13 (ref 5–15)
BUN: <5 mg/dL — ABNORMAL LOW (ref 6–20)
CO2: 19 mmol/L — ABNORMAL LOW (ref 22–32)
Calcium: 8.9 mg/dL (ref 8.9–10.3)
Chloride: 108 mmol/L (ref 98–111)
Creatinine, Ser: 0.58 mg/dL — ABNORMAL LOW (ref 0.61–1.24)
GFR calc Af Amer: >60 mL/min (ref >60)
GFR calc non Af Amer: >60 mL/min (ref >60)
Glucose, Bld: 75 mg/dL (ref 70–99)
Potassium: 4.3 mmol/L (ref 3.5–5.1)
Sodium: 140 mmol/L (ref 135–145)
Total Bilirubin: 0.6 mg/dL (ref 0.3–1.2)
Total Protein: 6.6 g/dL (ref 6.5–8.1)

## 2019-08-26 LAB — CBC WITH DIFFERENTIAL/PLATELET
Abs Immature Granulocytes: 0.04 10*3/uL (ref 0.00–0.07)
Basophils Absolute: 0 10*3/uL (ref 0.0–0.1)
Basophils Relative: 0 %
Eosinophils Absolute: 0.1 10*3/uL (ref 0.0–0.5)
Eosinophils Relative: 1 %
HCT: 37.1 % — ABNORMAL LOW (ref 39.0–52.0)
Hemoglobin: 12 g/dL — ABNORMAL LOW (ref 13.0–17.0)
Immature Granulocytes: 1 %
Lymphocytes Relative: 18 %
Lymphs Abs: 1.6 10*3/uL (ref 0.7–4.0)
MCH: 32.2 pg (ref 26.0–34.0)
MCHC: 32.3 g/dL (ref 30.0–36.0)
MCV: 99.5 fL (ref 80.0–100.0)
Monocytes Absolute: 0.8 10*3/uL (ref 0.1–1.0)
Monocytes Relative: 10 %
Neutro Abs: 6.3 10*3/uL (ref 1.7–7.7)
Neutrophils Relative %: 70 %
Platelets: 289 10*3/uL (ref 150–400)
RBC: 3.73 MIL/uL — ABNORMAL LOW (ref 4.22–5.81)
RDW: 15.3 % (ref 11.5–15.5)
WBC: 8.9 10*3/uL (ref 4.0–10.5)
nRBC: 0 % (ref 0.0–0.2)

## 2019-08-26 LAB — ECHOCARDIOGRAM COMPLETE
Height: 68 in
Weight: 1848.34 oz

## 2019-08-26 LAB — MAGNESIUM: Magnesium: 1.5 mg/dL — ABNORMAL LOW (ref 1.7–2.4)

## 2019-08-26 IMAGING — DX DG ABDOMEN ACUTE W/ 1V CHEST
3 series · 3 of 3 positions shown · non-contrast
Comparison: None.

CLINICAL DATA: Pt reports LLQ pain, emesis, and diarrhea since 2552
this AM; Pt reports hx of HTN and pancreatitis; smoker

EXAM:
DG ABDOMEN ACUTE W/ 1V CHEST

[chest pa]
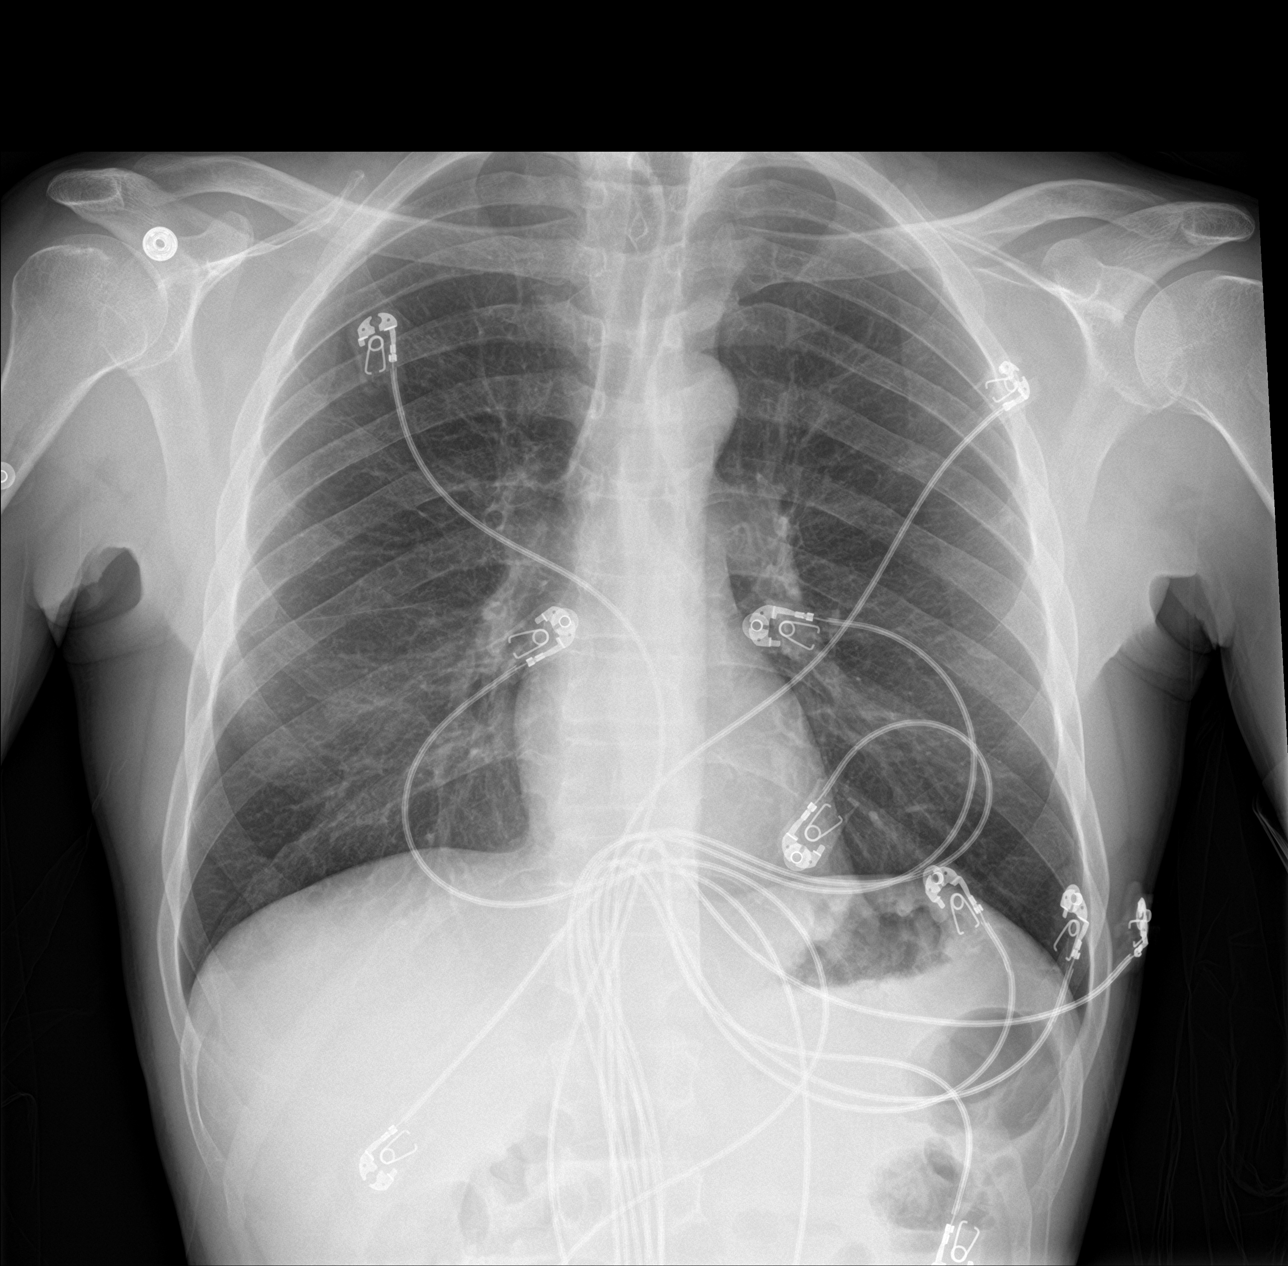

[abdomen erect]
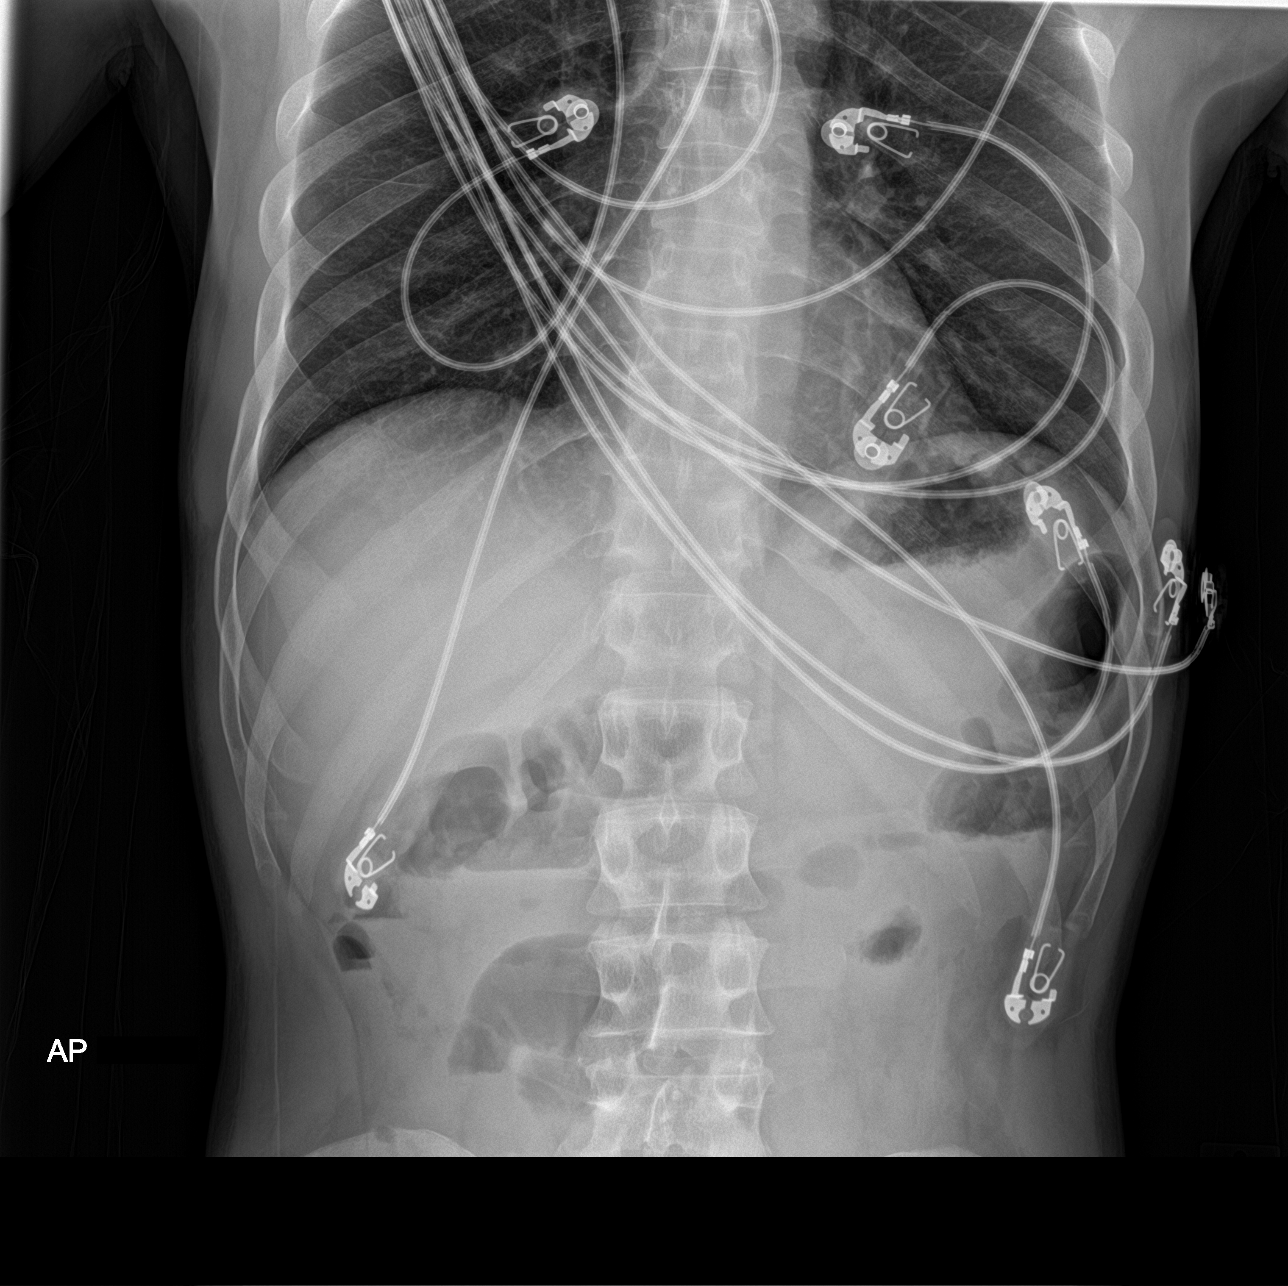

[abdomen supine]
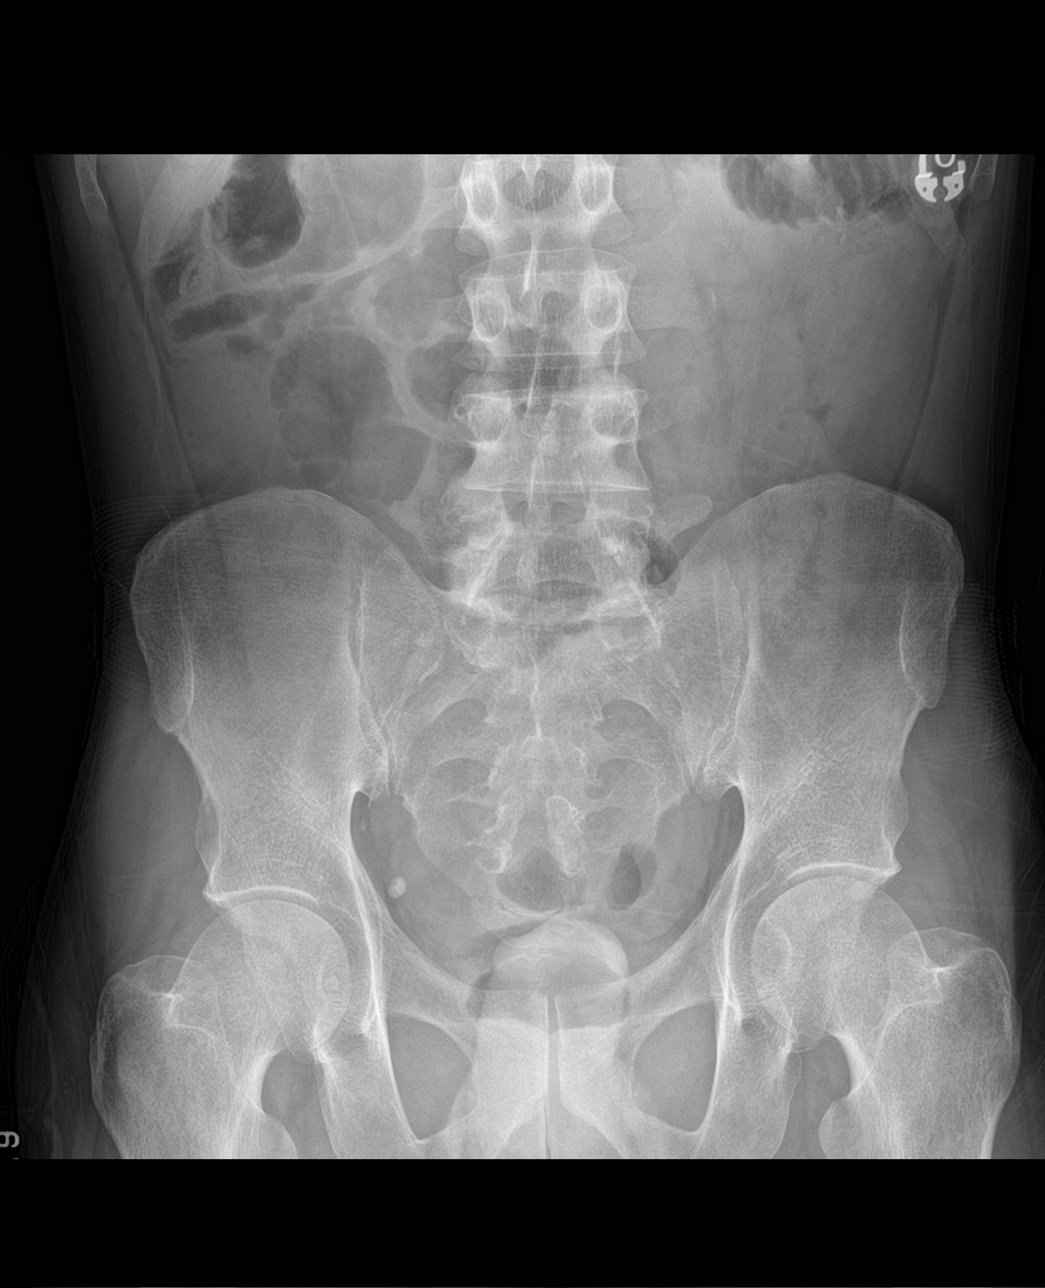

[3 of 3 positions shown; findings below may reference images not displayed]

FINDINGS: Single-view of the chest: Heart size is normal. Mediastinal contours
are normal. Lungs are clear. No pleural effusion or pneumothorax
seen. Osseous structures about the chest are unremarkable.

Supine and upright views of the abdomen: Scattered nonspecific
air-fluid levels. Mildly prominent gas-filled loop of small bowel
within the left abdomen. No evidence of free intraperitoneal air.
Osseous structures of the abdomen and pelvis are unremarkable.
IMPRESSION: 1. Mildly prominent gas-filled loops of small bowel within the
abdomen, with scattered nonspecific air-fluid levels. Findings are
compatible with partial small bowel obstruction or ileus.
2. No evidence of acute cardiopulmonary abnormality. Lungs are
clear.

## 2019-08-26 IMAGING — CT CT ABD-PELV W/O CM
2 of 4 series · 15 of 46 positions shown, 17 images · non-contrast
Comparison: CT abdomen pelvis 01/15/2017

CLINICAL DATA: History of bowel obstruction and pancreatitis. Lower
abdominal pain.

EXAM:
CT ABDOMEN AND PELVIS WITHOUT CONTRAST
TECHNIQUE: Multidetector CT imaging of the abdomen and pelvis was performed
following the standard protocol without IV contrast.

[Series 5: abd/ pelvis 5.0 i30f 2 · axial · 0.70mm/px · z∈[+731,+1146]mm · 12 of 95 slices shown, 14 images]
[im 8/95  soft-tissue]
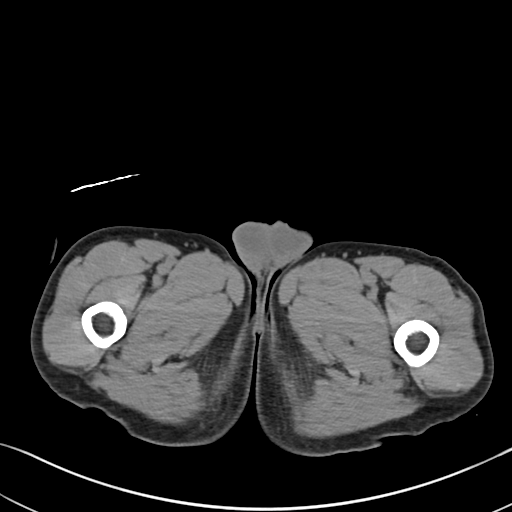
[im 8/95  bone]
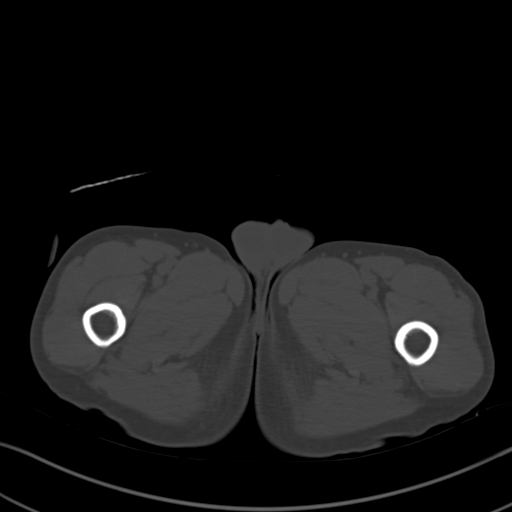
[im 16/95  soft-tissue]
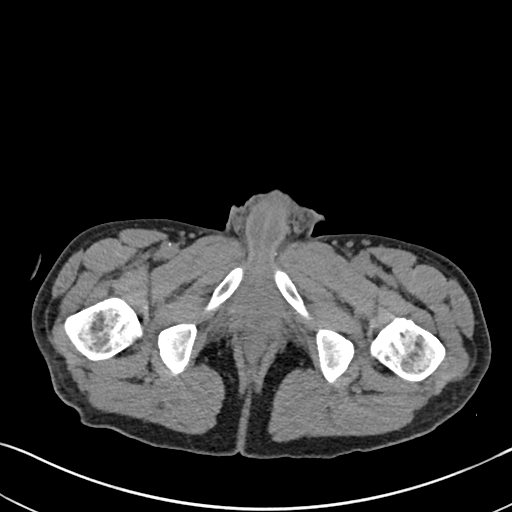
[im 23/95  soft-tissue]
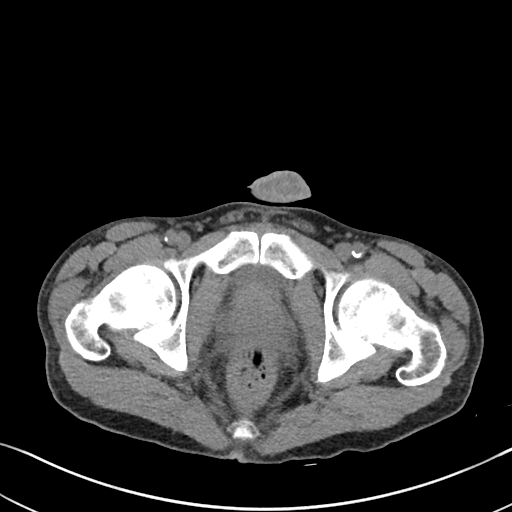
[im 31/95  soft-tissue]
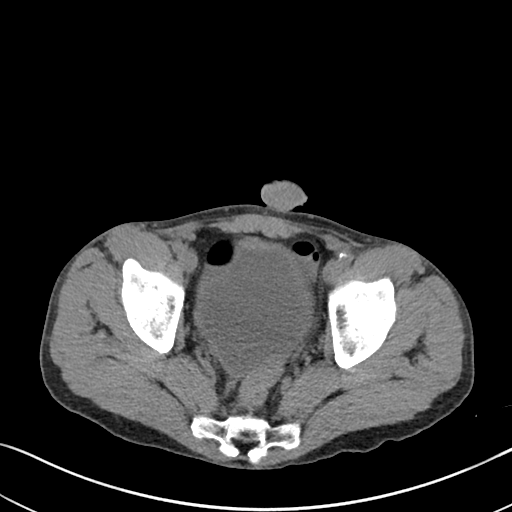
[im 38/95  soft-tissue]
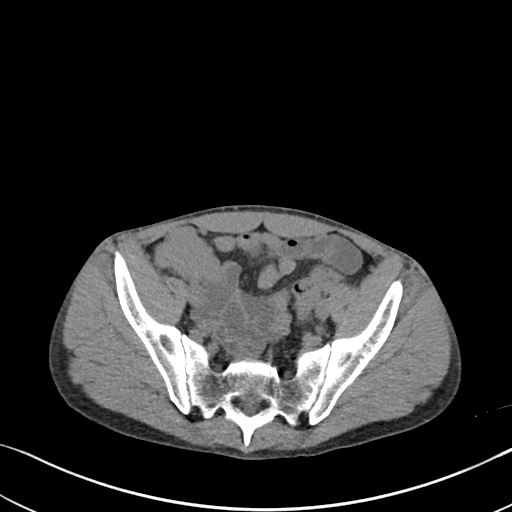
[im 46/95  soft-tissue]
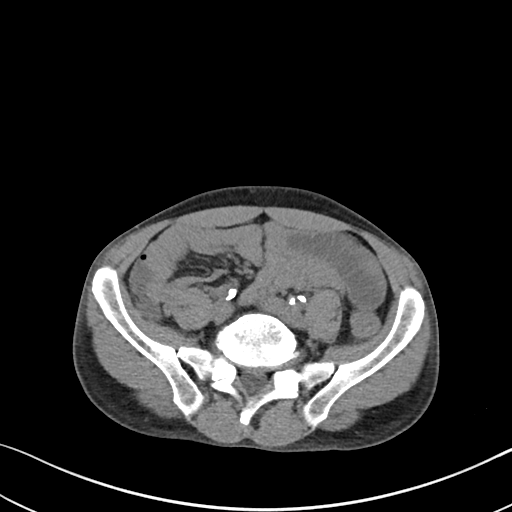
[im 53/95  soft-tissue]
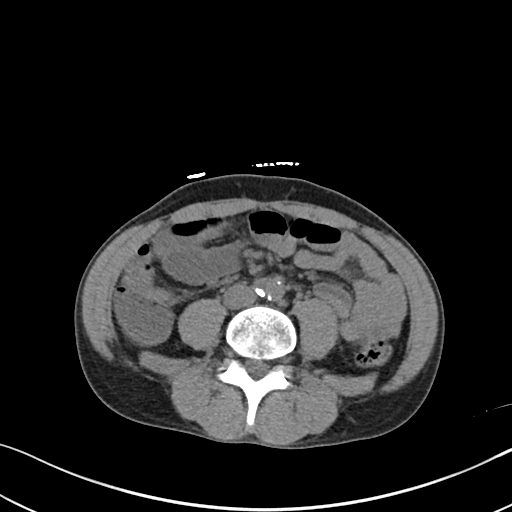
[im 61/95  soft-tissue]
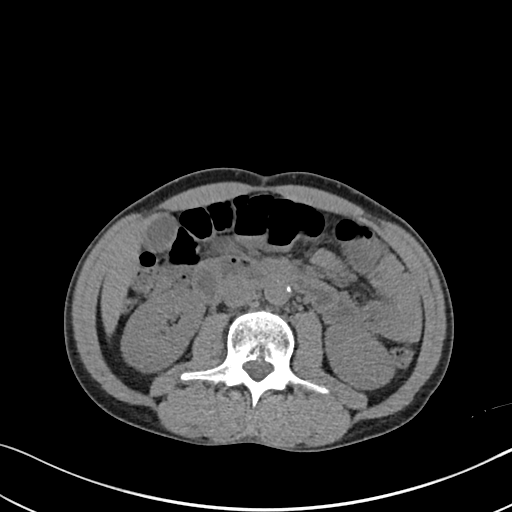
[im 68/95  soft-tissue]
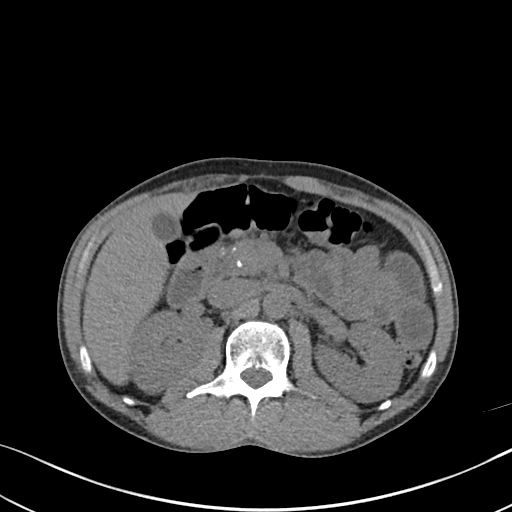
[im 68/95  bone]
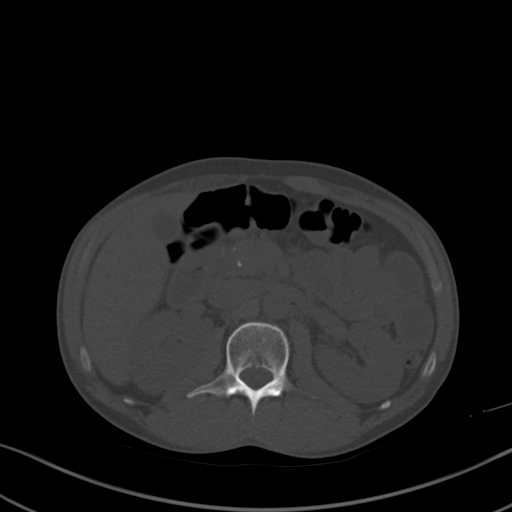
[im 76/95  soft-tissue]
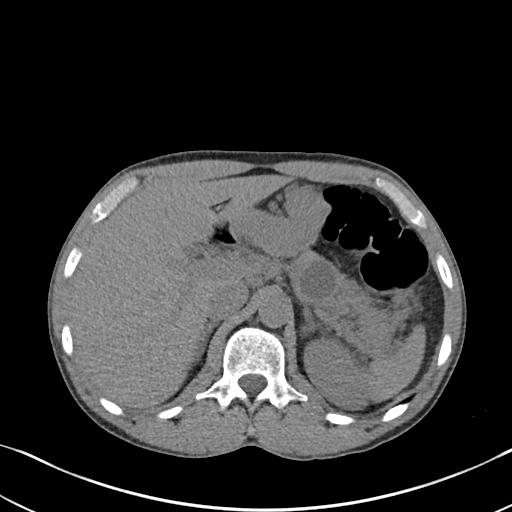
[im 83/95  soft-tissue]
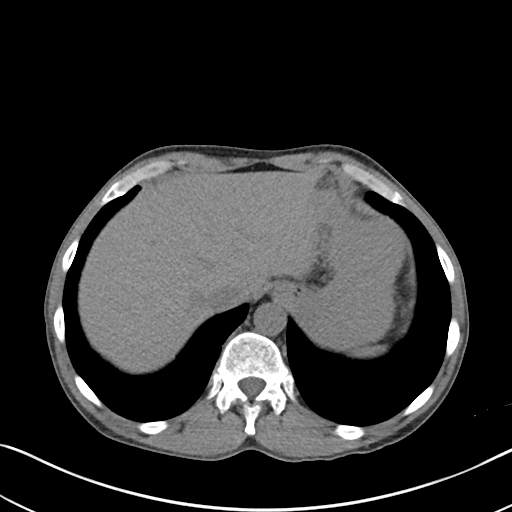
[im 91/95  soft-tissue]
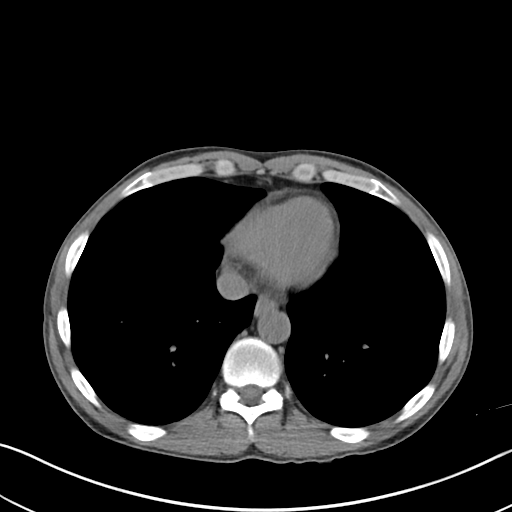

[Series 8: cor st · coronal · 0.68mm/px · 3 of 101 slices shown]
[im 34/101  soft-tissue]
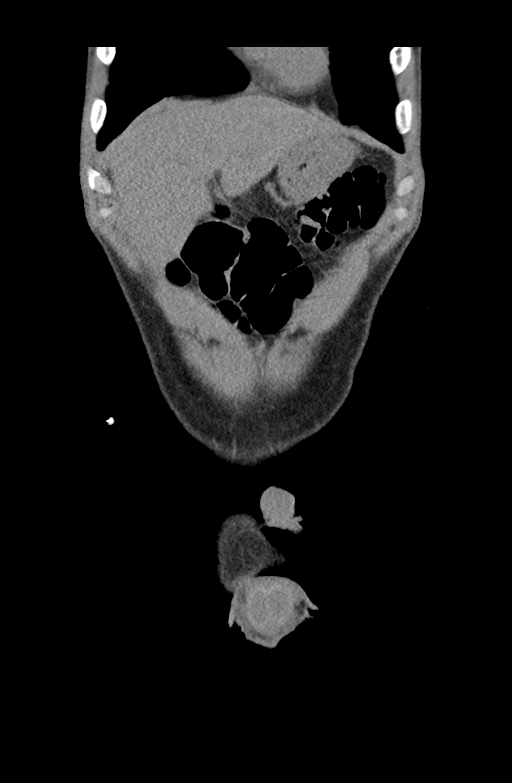
[im 45/101  soft-tissue]
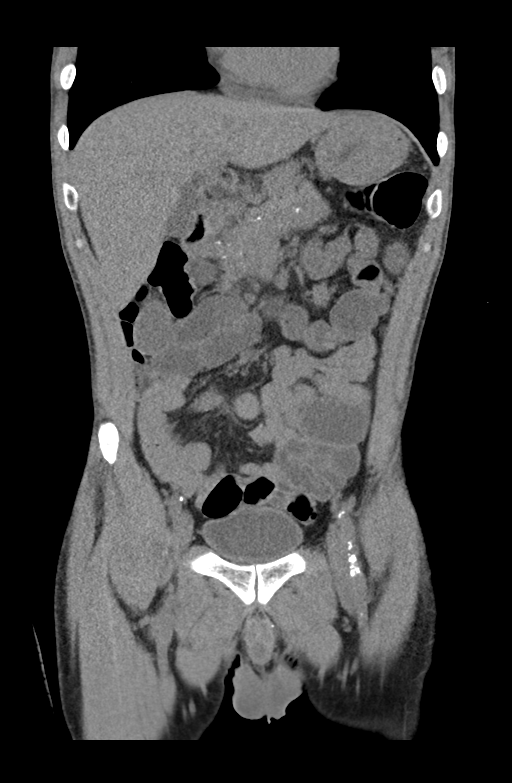
[im 56/101  soft-tissue]
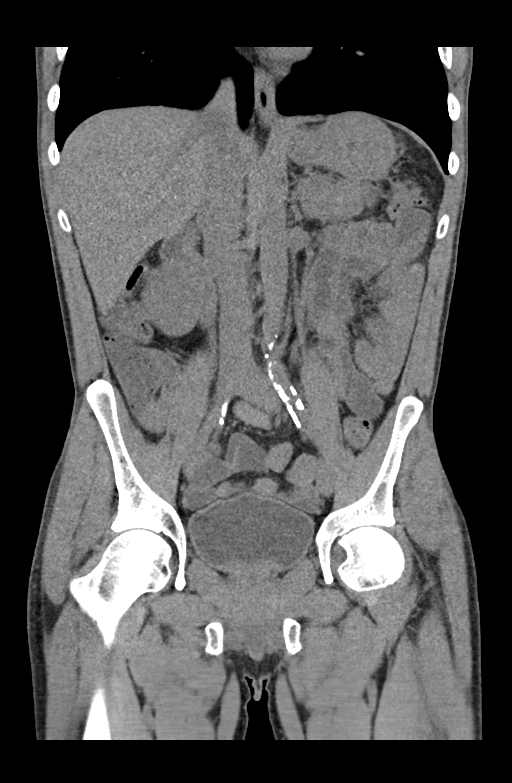

[15 of 46 positions shown; findings below may reference images not displayed]

FINDINGS: Lower chest: No pulmonary nodules or pleural effusion. No visible
pericardial effusion.

Hepatobiliary: Normal hepatic contours and density. No visible
biliary dilatation. Normal gallbladder.

Pancreas: Dystrophic calcifications throughout the pancreatic
parenchyma. Cystic structure in the tail of the pancreas measures
2.5 x 2.7 cm, unchanged. No acute peripancreatic inflammation or new
peripancreatic fluid collection. The pancreatic duct is not dilated.

Spleen: Normal.

Adrenals/Urinary Tract:

--Adrenal glands: Normal.

--Right kidney/ureter: No hydronephrosis or perinephric stranding.
No nephrolithiasis. No obstructing ureteral stones.

--Left kidney/ureter: No hydronephrosis or perinephric stranding. No
nephrolithiasis. No obstructing ureteral stones.

--Urinary bladder: Unremarkable.

Stomach/Bowel:

--Stomach/Duodenum: No hiatal hernia or other gastric abnormality.
Normal duodenal course and caliber.

--Small bowel: No dilatation or inflammation.

--Colon: No focal abnormality.

--Appendix: Not visualized. No right lower quadrant inflammation or
free fluid.

Vascular/Lymphatic: Atherosclerotic calcification is present within
the non-aneurysmal abdominal aorta, without hemodynamically
significant stenosis. No abdominal or pelvic lymphadenopathy.

Reproductive: Normal prostate and seminal vesicles.

Musculoskeletal. No bony spinal canal stenosis or focal osseous
abnormality.

Other: None.
IMPRESSION: 1. Chronic pseudo cyst at the tail of the pancreas with multiple
dystrophic pancreatic parenchymal calcifications, consistent with
chronic pancreatitis. No acute inflammatory change or new
peripancreatic fluid collection.
2. No other acute abnormality of the abdomen or pelvis.
Specifically, no evidence of small-bowel obstruction.
3.  Aortic Atherosclerosis ([id number]).

## 2019-08-26 MED ORDER — DICYCLOMINE HCL 10 MG/5ML PO SOLN
10.0000 mg | Freq: Once | ORAL | Status: AC
  Administered 2019-08-26: 10:00:00 10 mg via ORAL
  Filled 2019-08-26: qty 5

## 2019-08-26 MED ORDER — ALUM & MAG HYDROXIDE-SIMETH 200-200-20 MG/5ML PO SUSP
30.0000 mL | Freq: Once | ORAL | Status: AC
  Administered 2019-08-26: 30 mL via ORAL
  Filled 2019-08-26: qty 30

## 2019-08-26 MED ORDER — MORPHINE SULFATE (PF) 2 MG/ML IV SOLN
2.0000 mg | Freq: Once | INTRAVENOUS | Status: AC
  Administered 2019-08-26: 22:00:00 2 mg via INTRAVENOUS
  Filled 2019-08-26: qty 1

## 2019-08-26 MED ORDER — LORAZEPAM 2 MG/ML IJ SOLN: 1.0000 mg | Freq: Two times a day (BID) | INTRAMUSCULAR | Status: DC

## 2019-08-26 MED ORDER — LORAZEPAM 2 MG/ML IJ SOLN
1.0000 mg | INTRAMUSCULAR | Status: DC | PRN
  Administered 2019-08-26: 1 mg via INTRAMUSCULAR
  Filled 2019-08-26: qty 1

## 2019-08-26 MED ORDER — LORAZEPAM 2 MG/ML IJ SOLN
1.0000 mg | INTRAMUSCULAR | Status: DC | PRN
  Administered 2019-08-26: 2 mg via INTRAVENOUS
  Administered 2019-08-26 (×3): 1 mg via INTRAVENOUS
  Filled 2019-08-26 (×4): qty 1

## 2019-08-26 MED ORDER — CHLORDIAZEPOXIDE HCL 5 MG PO CAPS
10.0000 mg | ORAL_CAPSULE | Freq: Three times a day (TID) | ORAL | Status: DC
  Administered 2019-08-26 – 2019-08-29 (×9): 10 mg via ORAL
  Filled 2019-08-26 (×9): qty 2

## 2019-08-26 MED ORDER — LIDOCAINE VISCOUS HCL 2 % MT SOLN
15.0000 mL | Freq: Once | OROMUCOSAL | Status: AC
  Administered 2019-08-26: 15 mL via ORAL
  Filled 2019-08-26: qty 15

## 2019-08-26 MED ORDER — ALUM & MAG HYDROXIDE-SIMETH 200-200-20 MG/5ML PO SUSP
15.0000 mL | Freq: Four times a day (QID) | ORAL | Status: DC | PRN
  Administered 2019-08-27 – 2019-08-29 (×5): 15 mL via ORAL
  Filled 2019-08-26 (×5): qty 30

## 2019-08-26 NOTE — Progress Notes (Signed)
Pt found walking and pushing his bedside table in the hallway. Pt only wearing boxers.  Pt confused. Stated he was putting his table "with the rest of them". Pt reoriented and escorted back to his room. Stool noted all over pt's room, bathroom, and himself. Asking for pain medication. PRN morphine order has been completed and no longer active. PRN ativan given, based off of CIWA scale. Bed alarm on. MD paged for 1:1 sitter order.

## 2019-08-26 NOTE — Progress Notes (Signed)
Upon entry into room pt was awake and disoriented. He was trying to get out of bed, being extremely aggressive towards staff, kicking, trying to bite staff members.  MD notified for IM ativan because pt pulled out his IV.

## 2019-08-26 NOTE — Progress Notes (Signed)
Pt returned to room from echo and is very agitated. Pt yelling and cussing at staff. Rapid response RN came by to check on pt. CIWA score of 21 at time of her visit. 2mg  Ativan administered, per PRN order. IV morphine given as well for pt complaint of 8/10 pain. Pt calmed down after medication administration, however he refuses to allow staff to put his bed alarm on. We had a lengthy conversation about the importance of the alarm for his safety. Pt continues to refuse the alarm. Stated that he will not get up. Will check on pt frequently, as able.

## 2019-08-26 NOTE — Progress Notes (Signed)
Upon entering room, pt found to be eating skittles. Emesis noted on bedside table. Pt advised that his diet is full liquid and he should not continue to eat the skittles. PRN zofran administered. Pt continuing to eat handfuls of skittles.

## 2019-08-26 NOTE — Progress Notes (Signed)
Pt refusing vitals at this time.

## 2019-08-26 NOTE — Progress Notes (Signed)
[name]  PROGRESS NOTE    Kolson Sarafin  [id number] DOB: 1970/12/31 DOA: 08/24/2019 PCP: Marliss Coots, NP   Brief Narrative:   Jason Jansky Wrightis a 49 y.o.malewith medical history significant foralcohol use disorder, chronic pancreatitis, hypertension, GERD with history of gastritis and peptic ulcer disease,mood disorder, and substance use disorder who presents to the ED for evaluation of chest and abdominal pain.  Patient states yesterday he went to a funeral of his close friend. He says he normally drinks a sixpack of beer daily but has not had any alcohol for the last 2 weeks. Since his friend passed and he was with other friends he did start drinking again. He says last night he took 2 or 3 maximum strength ibuprofen. Around 2 PM earlier today (08/24/2019) he had sudden onset of left-sided chest pain which he says radiated up to his left shoulder, left neck, and jaw. He says the pain felt like a pressure sensation. He also reports epigastric and stomach pain with heartburn. He says this feels similar to his prior stomach ulcers. He has been taking Pepcid and Protonix daily. He says he did have an episode of bilious emesis earlier today. He has had subsequent nausea without further emesis. He reports having semisoft loose stool as well.  He has a past history of cocaine use but none denies any recent use. He reports marijuana use. He denies any IV drug use.  4/19: Pt a little more alert today but inconsistent in what he describes as his chest pain. It's anywhere between his left neck and LUQ ab. Will change his ativan to librium and can titrate. Have ordered GI cocktail to see if this helps his symptoms any. Echo results noted. Trp negative. Needs some magnesium today.    Assessment & Plan:   Principal Problem:   Chest pain Active Problems:   Essential hypertension   Mood disorder (HCC)   Chronic pancreatitis (HCC)   Alcohol use disorder, severe,  dependence (HCC)  Chest pain:     - Patient with typical type chest pain.       - EKG shows inferolateral T wave inversions which are new compared to prior from 08/08/2019 have been seen on multiple prior EKGs from last year.       - Trp are neg.       - He did have a cardiac catheterization in 2014 which showed normal coronary arteries.     - 4/18: echo pending, CXR neg     - 4/19: echo EF 60%, G1DD, no regional wall motion abnormalities  Abdominal pain GERD with history of gastritis and peptic ulcer disease:     - Patient also with symptoms consistent with gastritis in the setting of alcohol and NSAID use.     - He does have a history of peptic ulcer disease.       - Last upper endoscopy 11/14/2018 showed complete healing of previously seen ulcers.     - continue IV Protonix 40 mg twice daily, carafate     - 4/18: KUB is negative     - 4/19: continue above, GI cocktail ordered, patient asking to eat  Alcohol use disorder Alcohol withdrawal     - Reports ongoing alcohol use with history of withdrawal.     - Continue CIWA protocol with as needed Ativan     - 4/18: add scheduled ativan; may need movement to ICU for precedex if he does not improve     - 4/19:  transition to librium, watch CIWA scoring  Chronic pancreatitis:     - May be contributing to abdominal discomfort but seems primarily driven by gastritis.     - Continue Creon with meals  Hypertension:     - Resume home hydralazine and Lopressor.     - 4/18: He is currently not taking anything by mouth; can add PRN IV metoprolol for BP/HR control   Mood disorder:     - continue Seroquel, sertraline, and as needed hydroxyzine.  Substance use disorder:     - History of cocaine use, UDS negative for cocaine this admission. THC is positive.       - He does seem to exhibit some narcotic seeking behavior.     - 4/18: currently altered, likely d/t withdrawal, continue as above     - 4/19: more alert this AM, but intermittently  agitated.  DVT prophylaxis: SCDs Code Status: FULL   Status is: Inpatient  Remains inpatient appropriate because:Inpatient level of care appropriate due to severity of illness   Dispo: The patient is from: Home              Anticipated d/c is to: Home              Anticipated d/c date is: 3 days              Patient currently is not medically stable to d/c.  ROS:  Reports inconsistently located "chest" pain . Remainder 10-pt ROS is negative for all not previously mentioned.  Subjective: "I know where I'm at."  Objective: Vitals:   08/26/19 0800 08/26/19 0957 08/26/19 1036 08/26/19 1155  BP: 122/80 (!) 137/95    Pulse: (!) 105 (!) 147  89  Resp:  12 13 19   Temp:      TempSrc:      SpO2:      Weight:      Height:        Intake/Output Summary (Last 24 hours) at 08/26/2019 1346 Last data filed at 08/26/2019 0505 Gross per 24 hour  Intake --  Output 1900 ml  Net -1900 ml   Filed Weights   08/24/19 1406 08/25/19 0137 08/25/19 0535  Weight: 55.8 kg 55.2 kg 52.4 kg    Examination:  General: 49 y.o. male resting in bed in NAD Cardiovascular: RRR, +S1, S2, no m/g/r Respiratory: CTABL, no w/r/r, normal WOB GI: BS+, NDNT, soft, no masses noted MSK: No e/c/c Neuro: Alert to name, follows commands Psyc: somewhat agitated, but cooperative.   Data Reviewed: I have personally reviewed following labs and imaging studies.  CBC: Recent Labs  Lab 08/24/19 1412 08/26/19 0934  WBC 8.0 8.9  NEUTROABS  --  6.3  HGB 11.2* 12.0*  HCT 35.4* 37.1*  MCV 99.4 99.5  PLT 310 [id number]   Basic Metabolic Panel: Recent Labs  Lab 08/24/19 1412 08/26/19 0934  NA 134* 140  K 4.8 4.3  CL 102 108  CO2 20* 19*  GLUCOSE 86 75  BUN 7 <5*  CREATININE 0.59* 0.58*  CALCIUM 9.9 8.9  MG  --  1.5*   GFR: Estimated Creatinine Clearance: 81.9 mL/min (A) (by C-G formula based on SCr of 0.58 mg/dL (L)). Liver Function Tests: Recent Labs  Lab 08/24/19 1752 08/26/19 0934  AST 38 46*  ALT  31 26  ALKPHOS 82 80  BILITOT 0.6 0.6  PROT 7.4 6.6  ALBUMIN 3.5 3.2*   Recent Labs  Lab 08/24/19 1752  LIPASE 19  No results for input(s): AMMONIA in the last 168 hours. Coagulation Profile: No results for input(s): INR, PROTIME in the last 168 hours. Cardiac Enzymes: No results for input(s): CKTOTAL, CKMB, CKMBINDEX, TROPONINI in the last 168 hours. BNP (last 3 results) No results for input(s): PROBNP in the last 8760 hours. HbA1C: No results for input(s): HGBA1C in the last 72 hours. CBG: No results for input(s): GLUCAP in the last 168 hours. Lipid Profile: No results for input(s): CHOL, HDL, LDLCALC, TRIG, CHOLHDL, LDLDIRECT in the last 72 hours. Thyroid Function Tests: No results for input(s): TSH, T4TOTAL, FREET4, T3FREE, THYROIDAB in the last 72 hours. Anemia Panel: No results for input(s): VITAMINB12, FOLATE, FERRITIN, TIBC, IRON, RETICCTPCT in the last 72 hours. Sepsis Labs: No results for input(s): PROCALCITON, LATICACIDVEN in the last 168 hours.  Recent Results (from the past 240 hour(s))  SARS CORONAVIRUS 2 (TAT 6-24 HRS) Nasopharyngeal Nasopharyngeal Swab     Status: None   Collection Time: 08/24/19 11:58 PM   Specimen: Nasopharyngeal Swab  Result Value Ref Range Status   SARS Coronavirus 2 NEGATIVE NEGATIVE Final    Comment: (NOTE) SARS-CoV-2 target nucleic acids are NOT DETECTED. The SARS-CoV-2 RNA is generally detectable in upper and lower respiratory specimens during the acute phase of infection. Negative results do not preclude SARS-CoV-2 infection, do not rule out co-infections with other pathogens, and should not be used as the sole basis for treatment or other patient management decisions. Negative results must be combined with clinical observations, patient history, and epidemiological information. The expected result is Negative. Fact Sheet for Patients: SugarRoll.be Fact Sheet for Healthcare  Providers: https://www.woods-mathews.com/ This test is not yet approved or cleared by the [place] FDA and  has been authorized for detection and/or diagnosis of SARS-CoV-2 by FDA under an Emergency Use Authorization (EUA). This EUA will remain  in effect (meaning this test can be used) for the duration of the COVID-19 declaration under Section 56 4(b)(1) of the Act, 21 U.S.C. section 360bbb-3(b)(1), unless the authorization is terminated or revoked sooner. Performed at [facility], [place] [address] [place], [place] [address]       Radiology Studies: DG Chest 2 View  Result Date: 08/25/2019 CLINICAL DATA:  Chest and abdominal pain EXAM: CHEST - 2 VIEW COMPARISON:  03/02/2019 FINDINGS: Frontal and lateral views of the chest demonstrate an unremarkable cardiac silhouette. No airspace disease, effusion, or pneumothorax. No acute bony abnormalities. IMPRESSION: 1. No acute intrathoracic process. Electronically Signed   By: Randa Ngo M.D.   On: 08/25/2019 00:26   DG Abd 1 View  Result Date: 08/25/2019 CLINICAL DATA:  Abdominal pain, history of peptic ulcer disease and chronic pancreatitis EXAM: ABDOMEN - 1 VIEW COMPARISON:  08/08/2019 FINDINGS: Supine frontal view of the abdomen and pelvis excludes the hemidiaphragms by collimation. Bowel gas pattern is unremarkable without obstruction or ileus. No masses or abnormal calcifications. No acute bony abnormality. IMPRESSION: 1. Unremarkable bowel gas pattern. Electronically Signed   By: Randa Ngo M.D.   On: 08/25/2019 00:30   ECHOCARDIOGRAM COMPLETE  Result Date: 08/26/2019    ECHOCARDIOGRAM REPORT   Patient Name:   [facility] Date of Exam: 08/26/2019 Medical Rec #:  [id number]              Height:       68.0 in Accession #:    [id number]             Weight:       115.5 lb  Date of Birth:  June 27, 1970               BSA:          1.619 m Patient Age:    25 years               BP:           122/80 mmHg Patient  Gender: M                      HR:           107 bpm. Exam Location:  Inpatient Procedure: 2D Echo Indications:    chest pain 786.50  History:        Patient has prior history of Echocardiogram examinations, most                 recent 01/09/2018. Signs/Symptoms:Chest Pain; Risk                 Factors:Hypertension, Current Smoker and alcohol abuse.  Sonographer:    Johny Chess Referring Phys: [id number] [place]  1. Left ventricular ejection fraction, by estimation, is 60 to 65%. The left ventricle has normal function. The left ventricle has no regional wall motion abnormalities. Left ventricular diastolic parameters are consistent with Grade I diastolic dysfunction (impaired relaxation).  2. Right ventricular systolic function is normal. The right ventricular size is normal. There is normal pulmonary artery systolic pressure.  3. The mitral valve is normal in structure. No evidence of mitral valve regurgitation. No evidence of mitral stenosis.  4. The aortic valve is normal in structure. Aortic valve regurgitation is not visualized. No aortic stenosis is present.  5. The inferior vena cava is normal in size with greater than 50% respiratory variability, suggesting right atrial pressure of 3 mmHg. FINDINGS  Left Ventricle: Left ventricular ejection fraction, by estimation, is 60 to 65%. The left ventricle has normal function. The left ventricle has no regional wall motion abnormalities. The left ventricular internal cavity size was normal in size. There is  no left ventricular hypertrophy. Left ventricular diastolic parameters are consistent with Grade I diastolic dysfunction (impaired relaxation). Right Ventricle: The right ventricular size is normal. No increase in right ventricular wall thickness. Right ventricular systolic function is normal. There is normal pulmonary artery systolic pressure. The tricuspid regurgitant velocity is 2.25 m/s, and  with an assumed right atrial pressure of 3  mmHg, the estimated right ventricular systolic pressure is 123XX123 mmHg. Left Atrium: Left atrial size was normal in size. Right Atrium: Right atrial size was normal in size. Pericardium: There is no evidence of pericardial effusion. Mitral Valve: The mitral valve is normal in structure. Normal mobility of the mitral valve leaflets. Mild mitral annular calcification. No evidence of mitral valve regurgitation. No evidence of mitral valve stenosis. Tricuspid Valve: The tricuspid valve is normal in structure. Tricuspid valve regurgitation is not demonstrated. No evidence of tricuspid stenosis. Aortic Valve: The aortic valve is normal in structure. Aortic valve regurgitation is not visualized. No aortic stenosis is present. Pulmonic Valve: The pulmonic valve was normal in structure. Pulmonic valve regurgitation is not visualized. No evidence of pulmonic stenosis. Aorta: The aortic root is normal in size and structure. Venous: The inferior vena cava is normal in size with greater than 50% respiratory variability, suggesting right atrial pressure of 3 mmHg. IAS/Shunts: No atrial level shunt detected by color flow Doppler.  LEFT VENTRICLE PLAX 2D LVIDd:  3.50 cm  Diastology LVIDs:         2.40 cm  LV e' lateral:   9.36 cm/s LV PW:         1.10 cm  LV E/e' lateral: 7.1 LV IVS:        0.90 cm  LV e' medial:    6.31 cm/s LVOT diam:     1.80 cm  LV E/e' medial:  10.6 LV SV:         40 LV SV Index:   25 LVOT Area:     2.54 cm  RIGHT VENTRICLE RV S prime:     10.20 cm/s TAPSE (M-mode): 1.5 cm LEFT ATRIUM             Index       RIGHT ATRIUM          Index LA diam:        2.20 cm 1.36 cm/m  RA Area:     6.95 cm LA Vol (A2C):   22.8 ml 14.09 ml/m RA Volume:   12.30 ml 7.60 ml/m LA Vol (A4C):   18.5 ml 11.43 ml/m LA Biplane Vol: 21.4 ml 13.22 ml/m  AORTIC VALVE LVOT Vmax:   118.00 cm/s LVOT Vmean:  81.900 cm/s LVOT VTI:    0.157 m MITRAL VALVE               TRICUSPID VALVE MV Area (PHT): 3.30 cm    TR Peak grad:   20.2  mmHg MV Decel Time: 230 msec    TR Vmax:        225.00 cm/s MV E velocity: 66.90 cm/s MV A velocity: 75.70 cm/s  SHUNTS MV E/A ratio:  0.88        Systemic VTI:  0.16 m                            Systemic Diam: 1.80 cm Candee Furbish MD Electronically signed by Candee Furbish MD Signature Date/Time: 08/26/2019/11:46:01 AM    Final      Scheduled Meds: . chlordiazePOXIDE  10 mg Oral TID  . folic acid  1 mg Intravenous Daily  . hydrALAZINE  25 mg Oral TID  . lipase/protease/amylase  12,000 Units Oral TID WC  . metoprolol tartrate  100 mg Oral BID  . multivitamin with minerals  1 tablet Oral Daily  . pantoprazole (PROTONIX) IV  40 mg Intravenous Q12H  . QUEtiapine  100 mg Oral QHS  . sertraline  150 mg Oral Daily  . sodium chloride flush  3 mL Intravenous Once  . sucralfate  1 g Oral TID WC & HS  . thiamine  100 mg Oral Daily   Or  . thiamine  100 mg Intravenous Daily   Continuous Infusions:   LOS: 1 day    Time spent: 25 minutes spent in the coordination of care today.    Jonnie Finner, DO [facility]  If 7PM-7AM, please contact night-coverage [email or web address] 08/26/2019, 1:46 PM

## 2019-08-26 NOTE — Progress Notes (Signed)
  Echocardiogram 2D Echocardiogram has been performed.  [name] 08/26/2019, 9:40 AM

## 2019-08-26 NOTE — Significant Event (Signed)
Rapid Response Event Note  Overview: Follow Up CIWA  I came by to see [name] this morning, he has quite agitated, demanding his condom cath come off. I scored him a CIWA of 21 and asked his nurse to get some Ativan for him as well as some Morphine for his abdominal pain. While his nurse went to get the medications, I sat next to [name] and held his hand. We talked about his recent loss of a good friend. He became emotional and cried. We talked about how is friend is no longer suffering from cancer. After our talk, he stated he felt better after talking to someone. + tremors, endorses feeling pins/needles, see CIWA flowsheet for full score.    HR 140s, BP 137/95 (108), RR 20, 99% RA.   Plan: -- Ativan 2 mg IV for CIWA 21 -- Morphine 2 mg IV for pain  Start Time 1000 End Time [place], [place]

## 2019-08-26 NOTE — Progress Notes (Signed)
Pt woke up from sleep around 0445 today. He was agitated, alert, and able to answer all questions appropriately including his name, location, year, and situation.  He asked for something to eat and drink. I explained and educated to pt his NPO status and the events of the past 24 hours and our concern for his safety.  He does not remember events and his behavior.  He stated he just came in here for chest pain.  He started to get aggressive verbally and wanted something to eat or he was going to leave AMA. Stepped out of pt's room to call  doctor to let her know pt's wishes.  MD aware.  Pt started yelling at loudly at this point.  Went back into pt's room with charge nurse and was able to calm him down and explain to him the necessity for him to stay and get care here.  Took off pt's restraints but explained to him that he can not get up alone and staff member must be present to help him if he needs to get up for safety reasons, telemetry leads must be on and connected, and bed alarm has to be on as well. Pt reluctantly  agreed to safety measures and nodded understanding.  Restrains currently off pt, call bell within reach and pt is resting in bed. Will continue to monitor him closely and notify MD as needed.

## 2019-08-27 LAB — CBC WITH DIFFERENTIAL/PLATELET
Abs Immature Granulocytes: 0.03 10*3/uL (ref 0.00–0.07)
Basophils Absolute: 0 10*3/uL (ref 0.0–0.1)
Basophils Relative: 0 %
Eosinophils Absolute: 0.2 10*3/uL (ref 0.0–0.5)
Eosinophils Relative: 2 %
HCT: 31.3 % — ABNORMAL LOW (ref 39.0–52.0)
Hemoglobin: 10.1 g/dL — ABNORMAL LOW (ref 13.0–17.0)
Immature Granulocytes: 0 %
Lymphocytes Relative: 34 %
Lymphs Abs: 2.7 10*3/uL (ref 0.7–4.0)
MCH: 31.9 pg (ref 26.0–34.0)
MCHC: 32.3 g/dL (ref 30.0–36.0)
MCV: 98.7 fL (ref 80.0–100.0)
Monocytes Absolute: 0.9 10*3/uL (ref 0.1–1.0)
Monocytes Relative: 11 %
Neutro Abs: 4.2 10*3/uL (ref 1.7–7.7)
Neutrophils Relative %: 53 %
Platelets: 263 10*3/uL (ref 150–400)
RBC: 3.17 MIL/uL — ABNORMAL LOW (ref 4.22–5.81)
RDW: 14.9 % (ref 11.5–15.5)
WBC: 8 10*3/uL (ref 4.0–10.5)
nRBC: 0 % (ref 0.0–0.2)

## 2019-08-27 LAB — COMPREHENSIVE METABOLIC PANEL
ALT: 24 U/L (ref 0–44)
AST: 32 U/L (ref 15–41)
Albumin: 2.8 g/dL — ABNORMAL LOW (ref 3.5–5.0)
Alkaline Phosphatase: 63 U/L (ref 38–126)
Anion gap: 9 (ref 5–15)
BUN: <5 mg/dL — ABNORMAL LOW (ref 6–20)
CO2: 21 mmol/L — ABNORMAL LOW (ref 22–32)
Calcium: 8.8 mg/dL — ABNORMAL LOW (ref 8.9–10.3)
Chloride: 107 mmol/L (ref 98–111)
Creatinine, Ser: 0.65 mg/dL (ref 0.61–1.24)
GFR calc Af Amer: >60 mL/min (ref >60)
GFR calc non Af Amer: >60 mL/min (ref >60)
Glucose, Bld: 96 mg/dL (ref 70–99)
Potassium: 3.6 mmol/L (ref 3.5–5.1)
Sodium: 137 mmol/L (ref 135–145)
Total Bilirubin: 0.5 mg/dL (ref 0.3–1.2)
Total Protein: 5.9 g/dL — ABNORMAL LOW (ref 6.5–8.1)

## 2019-08-27 LAB — PHOSPHORUS: Phosphorus: 4.7 mg/dL — ABNORMAL HIGH (ref 2.5–4.6)

## 2019-08-27 LAB — MAGNESIUM: Magnesium: 1.3 mg/dL — ABNORMAL LOW (ref 1.7–2.4)

## 2019-08-27 MED ORDER — OXYCODONE-ACETAMINOPHEN 7.5-325 MG PO TABS
1.0000 | ORAL_TABLET | Freq: Four times a day (QID) | ORAL | Status: DC | PRN
  Administered 2019-08-27 – 2019-08-29 (×8): 1 via ORAL
  Filled 2019-08-27 (×8): qty 1

## 2019-08-27 MED ORDER — LIDOCAINE VISCOUS HCL 2 % MT SOLN
15.0000 mL | Freq: Once | OROMUCOSAL | Status: AC
  Administered 2019-08-27: 15 mL via ORAL
  Filled 2019-08-27: qty 15

## 2019-08-27 MED ORDER — FOLIC ACID 1 MG PO TABS
1.0000 mg | ORAL_TABLET | Freq: Every day | ORAL | Status: DC
  Administered 2019-08-27 – 2019-08-29 (×3): 1 mg via ORAL
  Filled 2019-08-27 (×4): qty 1

## 2019-08-27 MED ORDER — MAGNESIUM SULFATE 2 GM/50ML IV SOLN
2.0000 g | Freq: Once | INTRAVENOUS | Status: AC
  Administered 2019-08-27: 13:00:00 2 g via INTRAVENOUS
  Filled 2019-08-27: qty 50

## 2019-08-27 MED ORDER — ALUM & MAG HYDROXIDE-SIMETH 200-200-20 MG/5ML PO SUSP
30.0000 mL | Freq: Once | ORAL | Status: AC
  Administered 2019-08-27: 09:00:00 30 mL via ORAL
  Filled 2019-08-27: qty 30

## 2019-08-27 NOTE — Progress Notes (Addendum)
[name] Kitchen  PROGRESS NOTE    [name]  [id number] DOB: 19-Feb-1971 DOA: 08/24/2019 PCP: Marliss Coots, NP   Brief Narrative:   Jason Jansky Wrightis a 49 y.o.malewith medical history significant foralcohol use disorder, chronic pancreatitis, hypertension, GERD with history of gastritis and peptic ulcer disease,mood disorder, and substance use disorder who presents to the ED for evaluation of chest and abdominal pain.  Patient states yesterday he went to a funeral of his close friend. He says he normally drinks a sixpack of beer daily but has not had any alcohol for the last 2 weeks. Since his friend passed and he was with other friends he did start drinking again. He says last night he took 2 or 3 maximum strength ibuprofen. Around 2 PM earlier today (08/24/2019) he had sudden onset of left-sided chest pain which he says radiated up to his left shoulder, left neck, and jaw. He says the pain felt like a pressure sensation. He also reports epigastric and stomach pain with heartburn. He says this feels similar to his prior stomach ulcers. He has been taking Pepcid and Protonix daily. He says he did have an episode of bilious emesis earlier today. He has had subsequent nausea without further emesis. He reports having semisoft loose stool as well.  He has a past history of cocaine use but none denies any recent use. He reports marijuana use. He denies any IV drug use.  4/20: Denies CP today. Reports continue abdominal pain. Reports N/V, but wants to eat. Told him when need see if he can tolerate bland, easier foods before we advance him to something hardier. FLD ordered. Gave additional GI cocktail for ab pain. Continue protonix, prn maalox, sucrafate. No IV narcotics. BS are positive. Once tolerating diet and pain controlled, d/c to home. Continue TID librium.    Assessment & Plan:   Principal Problem:   Chest pain Active Problems:   Essential hypertension   Mood  disorder (HCC)   Chronic pancreatitis (HCC)   Alcohol use disorder, severe, dependence (HCC)  Chest pain: - Patient with typical type chest pain.  - EKG shows inferolateral T wave inversions which are new compared to prior from 08/08/2019 have been seen on multiple prior EKGs from last year.  - Trp are neg.  - He did have a cardiac catheterization in 2014 which showed normal coronary arteries. - 4/18: echo pending, CXR neg     - 4/19: echo EF 60%, G1DD, no regional wall motion abnormalities     - 4/20: Denies CP  Abdominal pain GERD with history of gastritis and peptic ulcer disease: - Patient also with symptoms consistent with gastritis in the setting of alcohol and NSAID use. - He does have a history of peptic ulcer disease.  - Last upper endoscopy 11/14/2018 showed complete healing of previously seen ulcers. - continue IV Protonix 40 mg twice daily, carafate - 4/18: There was initial concern for ileus on CT ab, a KUB is negative     - 4/19: continue above, GI cocktail ordered, patient asking to eat     - 4/20: reports ab pain, N, V. FLD diet. Anti-emetics. Likely chronic pancreatitis. Get him tolerating diet and he can follow up with PCP. It is noted that he has had BMs here.  Alcohol use disorder Alcohol withdrawal - Reports ongoing alcohol use with history of withdrawal. - Continue CIWA protocol with as needed Ativan - 4/18: add scheduled ativan; may need movement to ICU for precedex if he does  not improve     - 4/19: transition to librium, watch CIWA scoring     - 4/20: he is improved with librium. Taper as able.  Chronic pancreatitis: - May be contributing to abdominal discomfort but seems primarily driven by gastritis. - Continue Creon with meals  Hypertension: - Resume home hydralazine and Lopressor. - 4/18: He is currently not taking anything by mouth; can add PRN IV metoprolol for BP/HR control       - 4/20: BP is ok on meotprolol  Mood disorder: - continue Seroquel, sertraline, and as needed hydroxyzine.  Substance use disorder: - History of cocaine use, UDS negative for cocaine this admission. THC is positive.  - He does seem to exhibit some narcotic seeking behavior. - 4/18: currently altered, likely d/t withdrawal, continue as above     - 4/19: more alert this AM, but intermittently agitated.     - 4/20: improved on librium.  Hypomagnesemia     - IV Mg2+ today  DVT prophylaxis: SCDs Code Status: FULL   Status is: Inpatient  Remains inpatient appropriate because:Ongoing active pain requiring inpatient pain management   Dispo: The patient is from: Home              Anticipated d/c is to: Home              Anticipated d/c date is: 1 day              Patient currently is not medically stable to d/c.  ROS:  Denies CP. Reports N, V, ab pain. . Remainder 10-pt ROS is negative for all not previously mentioned.  Subjective: "But my lipase is normal."  Objective: Vitals:   08/27/19 0555 08/27/19 0809 08/27/19 0848 08/27/19 1052  BP: 110/81 107/83 118/86   Pulse: 77 89 100 72  Resp: 13 17    Temp: 98 F (36.7 C) 97.7 F (36.5 C)    TempSrc: Oral Oral    SpO2: 94% 96%    Weight:      Height:        Intake/Output Summary (Last 24 hours) at 08/27/2019 1153 Last data filed at 08/27/2019 0900 Gross per 24 hour  Intake 240 ml  Output --  Net 240 ml   Filed Weights   08/24/19 1406 08/25/19 0137 08/25/19 0535  Weight: 55.8 kg 55.2 kg 52.4 kg    Examination:  General: 49 y.o. male resting in bed in NAD Cardiovascular: RRR, +S1, S2, no m/g/r Respiratory: CTABL, no w/r/r, normal WOB GI: BS+, ND, no masses noted, no organomegaly noted, TTP LUQ MSK: No e/c/c Neuro: Alert to name, follows commands Psyc: Appropriate interaction and affect, calm/cooperative   Data Reviewed: I have personally reviewed following labs and imaging  studies.  CBC: Recent Labs  Lab 08/24/19 1412 08/26/19 0934 08/27/19 0337  WBC 8.0 8.9 8.0  NEUTROABS  --  6.3 4.2  HGB 11.2* 12.0* 10.1*  HCT 35.4* 37.1* 31.3*  MCV 99.4 99.5 98.7  PLT 310 289 [id number]   Basic Metabolic Panel: Recent Labs  Lab 08/24/19 1412 08/26/19 0934 08/27/19 0337  NA 134* 140 137  K 4.8 4.3 3.6  CL 102 108 107  CO2 20* 19* 21*  GLUCOSE 86 75 96  BUN 7 <5* <5*  CREATININE 0.59* 0.58* 0.65  CALCIUM 9.9 8.9 8.8*  MG  --  1.5* 1.3*  PHOS  --   --  4.7*   GFR: Estimated Creatinine Clearance: 81.9 mL/min (by C-G formula based on  SCr of 0.65 mg/dL). Liver Function Tests: Recent Labs  Lab 08/24/19 1752 08/26/19 0934 08/27/19 0337  AST 38 46* 32  ALT 31 26 24   ALKPHOS 82 80 63  BILITOT 0.6 0.6 0.5  PROT 7.4 6.6 5.9*  ALBUMIN 3.5 3.2* 2.8*   Recent Labs  Lab 08/24/19 1752  LIPASE 19   No results for input(s): AMMONIA in the last 168 hours. Coagulation Profile: No results for input(s): INR, PROTIME in the last 168 hours. Cardiac Enzymes: No results for input(s): CKTOTAL, CKMB, CKMBINDEX, TROPONINI in the last 168 hours. BNP (last 3 results) No results for input(s): PROBNP in the last 8760 hours. HbA1C: No results for input(s): HGBA1C in the last 72 hours. CBG: No results for input(s): GLUCAP in the last 168 hours. Lipid Profile: No results for input(s): CHOL, HDL, LDLCALC, TRIG, CHOLHDL, LDLDIRECT in the last 72 hours. Thyroid Function Tests: No results for input(s): TSH, T4TOTAL, FREET4, T3FREE, THYROIDAB in the last 72 hours. Anemia Panel: No results for input(s): VITAMINB12, FOLATE, FERRITIN, TIBC, IRON, RETICCTPCT in the last 72 hours. Sepsis Labs: No results for input(s): PROCALCITON, LATICACIDVEN in the last 168 hours.  Recent Results (from the past 240 hour(s))  SARS CORONAVIRUS 2 (TAT 6-24 HRS) Nasopharyngeal Nasopharyngeal Swab     Status: None   Collection Time: 08/24/19 11:58 PM   Specimen: Nasopharyngeal Swab  Result Value  Ref Range Status   SARS Coronavirus 2 NEGATIVE NEGATIVE Final    Comment: (NOTE) SARS-CoV-2 target nucleic acids are NOT DETECTED. The SARS-CoV-2 RNA is generally detectable in upper and lower respiratory specimens during the acute phase of infection. Negative results do not preclude SARS-CoV-2 infection, do not rule out co-infections with other pathogens, and should not be used as the sole basis for treatment or other patient management decisions. Negative results must be combined with clinical observations, patient history, and epidemiological information. The expected result is Negative. Fact Sheet for Patients: SugarRoll.be Fact Sheet for Healthcare Providers: https://www.woods-mathews.com/ This test is not yet approved or cleared by the [place] FDA and  has been authorized for detection and/or diagnosis of SARS-CoV-2 by FDA under an Emergency Use Authorization (EUA). This EUA will remain  in effect (meaning this test can be used) for the duration of the COVID-19 declaration under Section 56 4(b)(1) of the Act, 21 U.S.C. section 360bbb-3(b)(1), unless the authorization is terminated or revoked sooner. Performed at [facility], [place] [address], [place], [place] [address]       Radiology Studies: ECHOCARDIOGRAM COMPLETE  Result Date: 08/26/2019    ECHOCARDIOGRAM REPORT   Patient Name:   [facility] Date of Exam: 08/26/2019 Medical Rec #:  [id number]              Height:       68.0 in Accession #:    [id number]             Weight:       115.5 lb Date of Birth:  1971/01/28               BSA:          1.619 m Patient Age:    49 years               BP:           122/80 mmHg Patient Gender: M                      HR:  107 bpm. Exam Location:  Inpatient Procedure: 2D Echo Indications:    chest pain 786.50  History:        Patient has prior history of Echocardiogram examinations, most                 recent 01/09/2018.  Signs/Symptoms:Chest Pain; Risk                 Factors:Hypertension, Current Smoker and alcohol abuse.  Sonographer:    Johny Chess Referring Phys: [id number]  1. Left ventricular ejection fraction, by estimation, is 60 to 65%. The left ventricle has normal function. The left ventricle has no regional wall motion abnormalities. Left ventricular diastolic parameters are consistent with Grade I diastolic dysfunction (impaired relaxation).  2. Right ventricular systolic function is normal. The right ventricular size is normal. There is normal pulmonary artery systolic pressure.  3. The mitral valve is normal in structure. No evidence of mitral valve regurgitation. No evidence of mitral stenosis.  4. The aortic valve is normal in structure. Aortic valve regurgitation is not visualized. No aortic stenosis is present.  5. The inferior vena cava is normal in size with greater than 50% respiratory variability, suggesting right atrial pressure of 3 mmHg. FINDINGS  Left Ventricle: Left ventricular ejection fraction, by estimation, is 60 to 65%. The left ventricle has normal function. The left ventricle has no regional wall motion abnormalities. The left ventricular internal cavity size was normal in size. There is  no left ventricular hypertrophy. Left ventricular diastolic parameters are consistent with Grade I diastolic dysfunction (impaired relaxation). Right Ventricle: The right ventricular size is normal. No increase in right ventricular wall thickness. Right ventricular systolic function is normal. There is normal pulmonary artery systolic pressure. The tricuspid regurgitant velocity is 2.25 m/s, and  with an assumed right atrial pressure of 3 mmHg, the estimated right ventricular systolic pressure is 123XX123 mmHg. Left Atrium: Left atrial size was normal in size. Right Atrium: Right atrial size was normal in size. Pericardium: There is no evidence of pericardial effusion. Mitral Valve: The  mitral valve is normal in structure. Normal mobility of the mitral valve leaflets. Mild mitral annular calcification. No evidence of mitral valve regurgitation. No evidence of mitral valve stenosis. Tricuspid Valve: The tricuspid valve is normal in structure. Tricuspid valve regurgitation is not demonstrated. No evidence of tricuspid stenosis. Aortic Valve: The aortic valve is normal in structure. Aortic valve regurgitation is not visualized. No aortic stenosis is present. Pulmonic Valve: The pulmonic valve was normal in structure. Pulmonic valve regurgitation is not visualized. No evidence of pulmonic stenosis. Aorta: The aortic root is normal in size and structure. Venous: The inferior vena cava is normal in size with greater than 50% respiratory variability, suggesting right atrial pressure of 3 mmHg. IAS/Shunts: No atrial level shunt detected by color flow Doppler.  LEFT VENTRICLE PLAX 2D LVIDd:         3.50 cm  Diastology LVIDs:         2.40 cm  LV e' lateral:   9.36 cm/s LV PW:         1.10 cm  LV E/e' lateral: 7.1 LV IVS:        0.90 cm  LV e' medial:    6.31 cm/s LVOT diam:     1.80 cm  LV E/e' medial:  10.6 LV SV:         40 LV SV Index:   25 LVOT Area:  2.54 cm  RIGHT VENTRICLE RV S prime:     10.20 cm/s TAPSE (M-mode): 1.5 cm LEFT ATRIUM             Index       RIGHT ATRIUM          Index LA diam:        2.20 cm 1.36 cm/m  RA Area:     6.95 cm LA Vol (A2C):   22.8 ml 14.09 ml/m RA Volume:   12.30 ml 7.60 ml/m LA Vol (A4C):   18.5 ml 11.43 ml/m LA Biplane Vol: 21.4 ml 13.22 ml/m  AORTIC VALVE LVOT Vmax:   118.00 cm/s LVOT Vmean:  81.900 cm/s LVOT VTI:    0.157 m MITRAL VALVE               TRICUSPID VALVE MV Area (PHT): 3.30 cm    TR Peak grad:   20.2 mmHg MV Decel Time: 230 msec    TR Vmax:        225.00 cm/s MV E velocity: 66.90 cm/s MV A velocity: 75.70 cm/s  SHUNTS MV E/A ratio:  0.88        Systemic VTI:  0.16 m                            Systemic Diam: 1.80 cm Candee Furbish MD Electronically  signed by Candee Furbish MD Signature Date/Time: 08/26/2019/11:46:01 AM    Final      Scheduled Meds: . chlordiazePOXIDE  10 mg Oral TID  . folic acid  1 mg Oral Daily  . hydrALAZINE  25 mg Oral TID  . lipase/protease/amylase  12,000 Units Oral TID WC  . metoprolol tartrate  100 mg Oral BID  . multivitamin with minerals  1 tablet Oral Daily  . pantoprazole (PROTONIX) IV  40 mg Intravenous Q12H  . QUEtiapine  100 mg Oral QHS  . sertraline  150 mg Oral Daily  . sodium chloride flush  3 mL Intravenous Once  . sucralfate  1 g Oral TID WC & HS  . thiamine  100 mg Oral Daily   Continuous Infusions: . magnesium sulfate bolus IVPB       LOS: 2 days    Time spent: 25 minutes spent in the coordination of care today.    Jonnie Finner, DO [facility]  If 7PM-7AM, please contact night-coverage [email or web address] 08/27/2019, 11:53 AM

## 2019-08-27 NOTE — Progress Notes (Signed)
Patient ambulated in hallway with safety sitter. Patient wants to know why he has someone in his room with him. I explained that patient had been confused and unsteady and combative with staff. Patient reminded that cigarettes and lighter were found in his bed and tylenol on bedside table. Patient states that is our fault because he came with it and we did not take it away. Patient returned to room to eat breakfast. Pain medication given and patient returned to bed. Patient concerned that someone told him that he needed a stress test for his heart. I explained that he had ultrasound of his heart 08/26/19 and doctor specifically told him this morning that he would not need stress test. Patient concerned about lipase level and I reassured that doctor told him it was normal and not an issue. Pt resting with call bell within reach.  Will continue to monitor.

## 2019-08-27 NOTE — Progress Notes (Signed)
   08/27/19 1300  Clinical Encounter Type  Visited With Patient  Visit Type Initial  Referral From Nurse  Consult/Referral To [name] responded to consult for prayer. [name] desires assistance in getting off alcohol for good. He is requesting help getting placed into an inpatient rehab center. Primary road block is lack of insurance. [name] requested "something else for the pain." Chaplain informed him that was out of my scope but Chaplain would relay the message to the RN. Chaplain offered ministry of presence and prayer. Chaplains remain available for support as needs arise.   Chaplain Resident, [name], M Div [phone] on-call pager

## 2019-08-28 ENCOUNTER — Inpatient Hospital Stay (HOSPITAL_COMMUNITY): Payer: Medicaid Other

## 2019-08-28 DIAGNOSIS — R079 Chest pain, unspecified: Secondary | ICD-10-CM

## 2019-08-28 LAB — CBC WITH DIFFERENTIAL/PLATELET
Abs Immature Granulocytes: 0.01 10*3/uL (ref 0.00–0.07)
Basophils Absolute: 0 10*3/uL (ref 0.0–0.1)
Basophils Relative: 0 %
Eosinophils Absolute: 0.1 10*3/uL (ref 0.0–0.5)
Eosinophils Relative: 2 %
HCT: 28 % — ABNORMAL LOW (ref 39.0–52.0)
Hemoglobin: 9.2 g/dL — ABNORMAL LOW (ref 13.0–17.0)
Immature Granulocytes: 0 %
Lymphocytes Relative: 41 %
Lymphs Abs: 2.7 10*3/uL (ref 0.7–4.0)
MCH: 31.7 pg (ref 26.0–34.0)
MCHC: 32.9 g/dL (ref 30.0–36.0)
MCV: 96.6 fL (ref 80.0–100.0)
Monocytes Absolute: 0.6 10*3/uL (ref 0.1–1.0)
Monocytes Relative: 9 %
Neutro Abs: 3.2 10*3/uL (ref 1.7–7.7)
Neutrophils Relative %: 48 %
Platelets: 224 10*3/uL (ref 150–400)
RBC: 2.9 MIL/uL — ABNORMAL LOW (ref 4.22–5.81)
RDW: 14.7 % (ref 11.5–15.5)
WBC: 6.7 10*3/uL (ref 4.0–10.5)
nRBC: 0 % (ref 0.0–0.2)

## 2019-08-28 LAB — COMPREHENSIVE METABOLIC PANEL
ALT: 28 U/L (ref 0–44)
AST: 41 U/L (ref 15–41)
Albumin: 2.6 g/dL — ABNORMAL LOW (ref 3.5–5.0)
Alkaline Phosphatase: 63 U/L (ref 38–126)
Anion gap: 9 (ref 5–15)
BUN: <5 mg/dL — ABNORMAL LOW (ref 6–20)
CO2: 20 mmol/L — ABNORMAL LOW (ref 22–32)
Calcium: 8.1 mg/dL — ABNORMAL LOW (ref 8.9–10.3)
Chloride: 105 mmol/L (ref 98–111)
Creatinine, Ser: 0.55 mg/dL — ABNORMAL LOW (ref 0.61–1.24)
GFR calc Af Amer: >60 mL/min (ref >60)
GFR calc non Af Amer: >60 mL/min (ref >60)
Glucose, Bld: 110 mg/dL — ABNORMAL HIGH (ref 70–99)
Potassium: 3.4 mmol/L — ABNORMAL LOW (ref 3.5–5.1)
Sodium: 134 mmol/L — ABNORMAL LOW (ref 135–145)
Total Bilirubin: 0.2 mg/dL — ABNORMAL LOW (ref 0.3–1.2)
Total Protein: 5.5 g/dL — ABNORMAL LOW (ref 6.5–8.1)

## 2019-08-28 LAB — TROPONIN I (HIGH SENSITIVITY)
Troponin I (High Sensitivity): 4 ng/L (ref <18)
Troponin I (High Sensitivity): 4 ng/L (ref <18)

## 2019-08-28 LAB — MAGNESIUM: Magnesium: 1.4 mg/dL — ABNORMAL LOW (ref 1.7–2.4)

## 2019-08-28 LAB — D-DIMER, QUANTITATIVE: D-Dimer, Quant: 0.82 ug/mL-FEU — ABNORMAL HIGH (ref 0.00–0.50)

## 2019-08-28 MED ORDER — MAGNESIUM SULFATE 4 GM/100ML IV SOLN
4.0000 g | Freq: Once | INTRAVENOUS | Status: AC
  Administered 2019-08-28: 4 g via INTRAVENOUS
  Filled 2019-08-28: qty 100

## 2019-08-28 MED ORDER — POTASSIUM CHLORIDE CRYS ER 20 MEQ PO TBCR
40.0000 meq | EXTENDED_RELEASE_TABLET | Freq: Once | ORAL | Status: AC
  Administered 2019-08-28: 08:00:00 40 meq via ORAL
  Filled 2019-08-28: qty 2

## 2019-08-28 MED ORDER — PANCRELIPASE (LIP-PROT-AMYL) 12000-38000 UNITS PO CPEP
36000.0000 [IU] | ORAL_CAPSULE | Freq: Three times a day (TID) | ORAL | Status: DC
  Administered 2019-08-28 – 2019-08-29 (×4): 36000 [IU] via ORAL
  Filled 2019-08-28 (×4): qty 3

## 2019-08-28 MED ORDER — MORPHINE SULFATE (PF) 2 MG/ML IV SOLN
2.0000 mg | Freq: Once | INTRAVENOUS | Status: AC | PRN
  Administered 2019-08-28: 10:00:00 2 mg via INTRAVENOUS
  Filled 2019-08-28: qty 1

## 2019-08-28 MED ORDER — ALUM & MAG HYDROXIDE-SIMETH 200-200-20 MG/5ML PO SUSP
30.0000 mL | Freq: Once | ORAL | Status: AC
  Administered 2019-08-28: 30 mL via ORAL
  Filled 2019-08-28: qty 30

## 2019-08-28 MED ORDER — LIDOCAINE VISCOUS HCL 2 % MT SOLN
15.0000 mL | Freq: Once | OROMUCOSAL | Status: AC
  Administered 2019-08-28: 10:00:00 15 mL via ORAL
  Filled 2019-08-28: qty 15

## 2019-08-28 MED ORDER — TECHNETIUM TO 99M ALBUMIN AGGREGATED
1.5200 | Freq: Once | INTRAVENOUS | Status: AC | PRN
  Administered 2019-08-28: 17:00:00 1.52 via INTRAVENOUS

## 2019-08-28 NOTE — Progress Notes (Signed)
Complaints of chest pain. MD aware. EKG completed. Morphine given.  Paulene Floor, RN

## 2019-08-28 NOTE — Progress Notes (Signed)
Patient complaining of stomach and continued chest pain.  Maalox given. MD aware of continued chest pain. No new orders at this time.  Paulene Floor, RN

## 2019-08-28 NOTE — Progress Notes (Signed)
PROGRESS NOTE    [name]  [id number] DOB: August 30, 1970 DOA: 08/24/2019 PCP: Jason Coots, NP  Chief Complaint  Patient presents with  . Chest Pain    Brief Narrative:  [name] 49 y.o.malewith medical history significant foralcohol use disorder, chronic pancreatitis, hypertension, GERD with history of gastritis and peptic ulcer disease,mood disorder, and substance use disorder who presents to the ED for evaluation of chest and abdominal pain.  Patient states yesterday he went to [name] funeral of his close friend. He says he normally drinks [name] sixpack of beer daily but has not had any alcohol for the last 2 weeks. Since his friend passed and he was with other friends he did start drinking again. He says last night he took 2 or 3 maximum strength ibuprofen. Around 2 PM earlier today (08/24/2019) he had sudden onset of left-sided chest pain which he says radiated up to his left shoulder, left neck, and jaw. He says the pain felt like [name] pressure sensation. He also reports epigastric and stomach pain with heartburn. He says this feels similar to his prior stomach ulcers. He has been taking Pepcid and Protonix daily. He says he did have an episode of bilious emesis earlier today. He has had subsequent nausea without further emesis. He reports having semisoft loose stool as well.  He has [name] past history of cocaine use but none denies any recent use. He reports marijuana use. He denies any IV drug use.  Assessment & Plan:   Principal Problem:   Chest pain Active Problems:   Essential hypertension   Mood disorder (HCC)   Chronic pancreatitis (HCC)   Alcohol use disorder, severe, dependence (HCC)  Chest pain: - Patient with typical type chest pain. C/o CP today.  Not positional, possibly worse with eating, ?better lying back. - Repeat EKG 4/21 with t wave inversions in V3-V6.  These T wave inversions appear chronic.   - Trp are neg.  Repeat today negative.  D dimer elevated.     - follow VQ scan (contrast allergy) - He did have [name] cardiac catheterization in 2014 which showed normal coronary arteries. - 4/19: echo EF 60%, G1DD, no regional wall motion abnormalities  Abdominal pain GERD with history of gastritis and peptic ulcer disease: - Patient also with symptoms consistent with gastritis in the setting of alcohol and NSAID use. - He does have Martia Dalby history of peptic ulcer disease.  - Last upper endoscopy 11/14/2018 showed complete healing of previously seen ulcers. - continue IV Protonix 40 mg twice daily, carafate     - increase creon dose, follow  - 4/18: There was initial concern for ileus on CT ab, Malli Falotico KUB is negative     - tolerating full liquid diet, continue to monitor  Alcohol use disorder Alcohol withdrawal - Reports ongoing alcohol use with history of withdrawal. - Continue CIWA protocol with as needed Ativan - doing well on librium, continue to monitor  Chronic pancreatitis: - May be contributing to abdominal discomfort but seems primarily driven by gastritis. - Continue Creon with meals  Hypertension: - Resume home hydralazine and Lopressor. - BP currently appropriate  Mood disorder: -continueSeroquel, sertraline, and as needed hydroxyzine.  Substance use disorder: - History of cocaine use, UDS negative for cocaine this admission. THC is positive.  - He does seem to exhibit some narcotic seeking behavior per previous provider  Hypomagnesemia  Hypokalemia     - replace and follow   DVT prophylaxis: SCD Code Status:  full Family Communication: none at bedside Disposition:   Status is: Inpatient  Remains inpatient appropriate because:Ongoing active pain requiring inpatient pain management, Ongoing diagnostic testing needed not appropriate for outpatient work up and Inpatient level of care appropriate due to severity of  illness   Dispo: The patient is from: Home              Anticipated d/c is to: Home              Anticipated d/c date is: 1 day              Patient currently is not medically stable to d/c.  Consultants:   none  Procedures:  Echo IMPRESSIONS    1. Left ventricular ejection fraction, by estimation, is 60 to 65%. The  left ventricle has normal function. The left ventricle has no regional  wall motion abnormalities. Left ventricular diastolic parameters are  consistent with Grade I diastolic  dysfunction (impaired relaxation).  2. Right ventricular systolic function is normal. The right ventricular  size is normal. There is normal pulmonary artery systolic pressure.  3. The mitral valve is normal in structure. No evidence of mitral valve  regurgitation. No evidence of mitral stenosis.  4. The aortic valve is normal in structure. Aortic valve regurgitation is  not visualized. No aortic stenosis is present.  5. The inferior vena cava is normal in size with greater than 50%  respiratory variability, suggesting right atrial pressure of 3 mmHg.    Antimicrobials:  Anti-infectives (From admission, onward)   None     Subjective: C/o CP and abdominal pain  Objective: Vitals:   08/27/19 2104 08/28/19 0335 08/28/19 0758 08/28/19 1216  BP: (!) 128/98 105/67 (!) 122/94 101/66  Pulse: 88 71 82 76  Resp: 16 18 16 16   Temp: 98 F (36.7 C) 97.8 F (36.6 C) 97.6 F (36.4 C) 98 F (36.7 C)  TempSrc: Oral Oral Oral Oral  SpO2: 98% 99% 99% 100%  Weight:      Height:        Intake/Output Summary (Last 24 hours) at 08/28/2019 1524 Last data filed at 08/28/2019 1000 Gross per 24 hour  Intake 1160 ml  Output 3475 ml  Net -2315 ml   Filed Weights   08/24/19 1406 08/25/19 0137 08/25/19 0535  Weight: 55.8 kg 55.2 kg 52.4 kg    Examination:  General exam: Appears calm and comfortable.  Seen walking to bedside. Respiratory system: Clear to auscultation. Respiratory effort  normal. Cardiovascular system: S1 & S2 heard, RRR. No reproducible CP with palpation. Gastrointestinal system: Abdomen is nondistended, soft and nontender. Central nervous system: Alert and oriented. No focal neurological deficits. Extremities: no LEE Skin: No rashes, lesions or ulcers Psychiatry: Judgement and insight appear normal. Mood & affect appropriate.     Data Reviewed: I have personally reviewed following labs and imaging studies  CBC: Recent Labs  Lab 08/24/19 1412 08/26/19 0934 08/27/19 0337 08/28/19 0225  WBC 8.0 8.9 8.0 6.7  NEUTROABS  --  6.3 4.2 3.2  HGB 11.2* 12.0* 10.1* 9.2*  HCT 35.4* 37.1* 31.3* 28.0*  MCV 99.4 99.5 98.7 96.6  PLT 310 289 263 [id number]    Basic Metabolic Panel: Recent Labs  Lab 08/24/19 1412 08/26/19 0934 08/27/19 0337 08/28/19 0225  NA 134* 140 137 134*  K 4.8 4.3 3.6 3.4*  CL 102 108 107 105  CO2 20* 19* 21* 20*  GLUCOSE 86 75 96 110*  BUN 7 <5* <5* <5*  CREATININE 0.59* 0.58* 0.65 0.55*  CALCIUM 9.9 8.9 8.8* 8.1*  MG  --  1.5* 1.3* 1.4*  PHOS  --   --  4.7*  --     GFR: Estimated Creatinine Clearance: 81.9 mL/min (Pamlea Finder) (by C-G formula based on SCr of 0.55 mg/dL (L)).  Liver Function Tests: Recent Labs  Lab 08/24/19 1752 08/26/19 0934 08/27/19 0337 08/28/19 0225  AST 38 46* 32 41  ALT 31 26 24 28   ALKPHOS 82 80 63 63  BILITOT 0.6 0.6 0.5 0.2*  PROT 7.4 6.6 5.9* 5.5*  ALBUMIN 3.5 3.2* 2.8* 2.6*    CBG: No results for input(s): GLUCAP in the last 168 hours.   Recent Results (from the past 240 hour(s))  SARS CORONAVIRUS 2 (TAT 6-24 HRS) Nasopharyngeal Nasopharyngeal Swab     Status: None   Collection Time: 08/24/19 11:58 PM   Specimen: Nasopharyngeal Swab  Result Value Ref Range Status   SARS Coronavirus 2 NEGATIVE NEGATIVE Final    Comment: (NOTE) SARS-CoV-2 target nucleic acids are NOT DETECTED. The SARS-CoV-2 RNA is generally detectable in upper and lower respiratory specimens during the acute phase of  infection. Negative results do not preclude SARS-CoV-2 infection, do not rule out co-infections with other pathogens, and should not be used as the sole basis for treatment or other patient management decisions. Negative results must be combined with clinical observations, patient history, and epidemiological information. The expected result is Negative. Fact Sheet for Patients: SugarRoll.be Fact Sheet for Healthcare Providers: https://www.woods-mathews.com/ This test is not yet approved or cleared by the [place] FDA and  has been authorized for detection and/or diagnosis of SARS-CoV-2 by FDA under an Emergency Use Authorization (EUA). This EUA will remain  in effect (meaning this test can be used) for the duration of the COVID-19 declaration under Section 56 4(b)(1) of the Act, 21 U.S.C. section 360bbb-3(b)(1), unless the authorization is terminated or revoked sooner. Performed at [facility], [place] [address] [place], [place] [address]          Radiology Studies: No results found.      Scheduled Meds: . chlordiazePOXIDE  10 mg Oral TID  . folic acid  1 mg Oral Daily  . hydrALAZINE  25 mg Oral TID  . lipase/protease/amylase  36,000 Units Oral TID WC  . metoprolol tartrate  100 mg Oral BID  . multivitamin with minerals  1 tablet Oral Daily  . pantoprazole (PROTONIX) IV  40 mg Intravenous Q12H  . QUEtiapine  100 mg Oral QHS  . sertraline  150 mg Oral Daily  . sodium chloride flush  3 mL Intravenous Once  . sucralfate  1 g Oral TID WC & HS  . thiamine  100 mg Oral Daily   Continuous Infusions:   LOS: 3 days    Time spent: over 30 min    Fayrene Helper, MD [facility]   To contact the attending provider between 7A-7P or the covering provider during after hours 7P-7A, please log into the web site [email or web address] and access using universal [place] password for that web site. If you do not have the  password, please call the hospital operator.  08/28/2019, 3:24 PM

## 2019-08-28 NOTE — Progress Notes (Signed)
Met w pt; vital signs area 83 HR, resp at 14; 101/66. No distress noted. Pt reports history of stomach ulcers and pancreatitis. Denies "ever been strung out on narcotics".states last used cocaine a year ago; confirmed md was here earlier. Allowed one time dose of morphine. States it did not help his chest area pain. Describes it as sharp, pointing to left upper mid area close to upper stomach. States does not want any narcotic but wants to know whats going on. Early part of the conversation however he questioned why he could not have more pain meds since it was not helping.  MD called into patient room. Will continue to support pt as we can. ----JM

## 2019-08-29 LAB — CBC WITH DIFFERENTIAL/PLATELET
Abs Immature Granulocytes: 0.03 10*3/uL (ref 0.00–0.07)
Basophils Absolute: 0 10*3/uL (ref 0.0–0.1)
Basophils Relative: 0 %
Eosinophils Absolute: 0.1 10*3/uL (ref 0.0–0.5)
Eosinophils Relative: 2 %
HCT: 29.7 % — ABNORMAL LOW (ref 39.0–52.0)
Hemoglobin: 9.7 g/dL — ABNORMAL LOW (ref 13.0–17.0)
Immature Granulocytes: 0 %
Lymphocytes Relative: 43 %
Lymphs Abs: 3.3 10*3/uL (ref 0.7–4.0)
MCH: 32.4 pg (ref 26.0–34.0)
MCHC: 32.7 g/dL (ref 30.0–36.0)
MCV: 99.3 fL (ref 80.0–100.0)
Monocytes Absolute: 0.9 10*3/uL (ref 0.1–1.0)
Monocytes Relative: 12 %
Neutro Abs: 3.4 10*3/uL (ref 1.7–7.7)
Neutrophils Relative %: 43 %
Platelets: 254 10*3/uL (ref 150–400)
RBC: 2.99 MIL/uL — ABNORMAL LOW (ref 4.22–5.81)
RDW: 14.8 % (ref 11.5–15.5)
WBC: 7.8 10*3/uL (ref 4.0–10.5)
nRBC: 0 % (ref 0.0–0.2)

## 2019-08-29 LAB — COMPREHENSIVE METABOLIC PANEL
ALT: 30 U/L (ref 0–44)
AST: 65 U/L — ABNORMAL HIGH (ref 15–41)
Albumin: 2.9 g/dL — ABNORMAL LOW (ref 3.5–5.0)
Alkaline Phosphatase: 69 U/L (ref 38–126)
Anion gap: 7 (ref 5–15)
BUN: <5 mg/dL — ABNORMAL LOW (ref 6–20)
CO2: 19 mmol/L — ABNORMAL LOW (ref 22–32)
Calcium: 8.4 mg/dL — ABNORMAL LOW (ref 8.9–10.3)
Chloride: 109 mmol/L (ref 98–111)
Creatinine, Ser: 0.61 mg/dL (ref 0.61–1.24)
GFR calc Af Amer: >60 mL/min (ref >60)
GFR calc non Af Amer: >60 mL/min (ref >60)
Glucose, Bld: 96 mg/dL (ref 70–99)
Potassium: 4.8 mmol/L (ref 3.5–5.1)
Sodium: 135 mmol/L (ref 135–145)
Total Bilirubin: 0.2 mg/dL — ABNORMAL LOW (ref 0.3–1.2)
Total Protein: 5.5 g/dL — ABNORMAL LOW (ref 6.5–8.1)

## 2019-08-29 LAB — TSH: TSH: 1.028 u[IU]/mL (ref 0.350–4.500)

## 2019-08-29 LAB — MAGNESIUM: Magnesium: 1.7 mg/dL (ref 1.7–2.4)

## 2019-08-29 LAB — PHOSPHORUS: Phosphorus: 3.3 mg/dL (ref 2.5–4.6)

## 2019-08-29 MED ORDER — LIDOCAINE VISCOUS HCL 2 % MT SOLN: 15.0000 mL | Freq: Four times a day (QID) | OROMUCOSAL | Status: DC | PRN

## 2019-08-29 MED ORDER — PANTOPRAZOLE SODIUM 40 MG PO TBEC: 40.0000 mg | DELAYED_RELEASE_TABLET | Freq: Two times a day (BID) | ORAL | 0 refills | Status: DC

## 2019-08-29 MED ORDER — CHLORDIAZEPOXIDE HCL 10 MG PO CAPS: ORAL_CAPSULE | ORAL | 0 refills | Status: AC

## 2019-08-29 MED ORDER — ACETAMINOPHEN 500 MG PO TABS: 1000.0000 mg | ORAL_TABLET | Freq: Three times a day (TID) | ORAL | 0 refills | Status: DC | PRN

## 2019-08-29 MED ORDER — ALUM & MAG HYDROXIDE-SIMETH 200-200-20 MG/5ML PO SUSP: 30.0000 mL | Freq: Four times a day (QID) | ORAL | Status: DC | PRN

## 2019-08-29 MED ORDER — PANCRELIPASE (LIP-PROT-AMYL) 12000-38000 UNITS PO CPEP: 36000.0000 [IU] | ORAL_CAPSULE | Freq: Three times a day (TID) | ORAL | 0 refills | Status: DC

## 2019-08-29 MED ORDER — OXYCODONE-ACETAMINOPHEN 7.5-325 MG PO TABS: 1.0000 | ORAL_TABLET | Freq: Four times a day (QID) | ORAL | 0 refills | Status: AC | PRN

## 2019-08-29 NOTE — Discharge Summary (Signed)
Physician Discharge Summary  [name] [id number] DOB: 27-May-1970 DOA: 08/24/2019  PCP: Marliss Coots, NP  Admit date: 08/24/2019 Discharge date: 08/29/2019  Time spent: 40 minutes  Recommendations for Outpatient Follow-up:  1. Follow outpatient CBC/CMP 2. Follow chronic pain outpatient  3. Consider GI follow up for chronic pancreatitis and gastritis    Discharge Diagnoses:  Principal Problem:   Chest pain Active Problems:   Essential hypertension   Mood disorder (HCC)   Chronic pancreatitis (HCC)   Alcohol use disorder, severe, dependence ([place])  Discharge Condition: stable  Diet recommendation: heart healthy   Filed Weights   08/24/19 1406 08/25/19 0137 08/25/19 0535  Weight: 55.8 kg 55.2 kg 52.4 kg    History of present illness:  Jason Raphael Wrightis [name] 49 y.o.malewith medical history significant foralcohol use disorder, chronic pancreatitis, hypertension, GERD with history of gastritis and peptic ulcer disease,mood disorder, and substance use disorder who presents to the ED for evaluation of chest and abdominal pain.  Patient states yesterday he went to [name] funeral of his close friend. He says he normally drinks [name] sixpack of beer daily but has not had any alcohol for the last 2 weeks. Since his friend passed and he was with other friends he did start drinking again. He says last night he took 2 or 3 maximum strength ibuprofen. Around 2 PM earlier today (08/24/2019) he had sudden onset of left-sided chest pain which he says radiated up to his left shoulder, left neck, and jaw. He says the pain felt like [name] pressure sensation. He also reports epigastric and stomach pain with heartburn. He says this feels similar to his prior stomach ulcers. He has been taking Pepcid and Protonix daily. He says he did have an episode of bilious emesis earlier today. He has had subsequent nausea without further emesis. He reports having semisoft loose stool as  well.  He has [name] past history of cocaine use but none denies any recent use. He reports marijuana use. He denies any IV drug use.  He was admitted with abdominal and chest pain thought 2/2 gastritis and chronic pancreatitis.  He's gradually improved.  VQ scan negative for PE.  Troponins negative.  He was discharged on 4/22 with plans for outpatient follow up.    Hospital Course:  Chest pain: - Patient with typical type chest pain. C/o CP today.  Not positional, possibly worse with eating, ?better lying back.  BP similar in both arms. - Repeat EKG 4/21 with t wave inversions in V3-V6.  These T wave inversions appear chronic.  - Trp are neg. Repeat today negative.  D dimer elevated.     - follow VQ scan - negative for PE - He did have Alesa Echevarria cardiac catheterization in 2014 which showed normal coronary arteries. - 4/19: echo EF 60%, G1DD, no regional wall motion abnormalities     - pt notes pain he describes today has been chronic, on/off for about 1 year - on exam, seems to be worse with epigastric palpation which supports gastritis/chronic pancreatitis as likely etiology of his pain.  Abdominal pain GERD with history of gastritis and peptic ulcer disease Chronic Pancreatitis - Patient also with symptoms consistent with gastritis in the setting of alcohol and NSAID use. - He does have [name] history of peptic ulcer disease.  - Last upper endoscopy 11/14/2018 showed complete healing of previously seen ulcers. - continue IV Protonix 40 mg twice daily, carafate -> resume oral PPI at discharge     -  increase creon dose, follow  - 4/18:There was initial concern for ileus on CT ab, aKUB is negative     - tolerating full liquid diet, continue to monitor - tolerating normal diet today  Alcohol use disorder Alcohol withdrawal - Reports ongoing alcohol use with history of withdrawal. - Continue CIWA protocol with as needed Ativan - doing well on  librium, continue to monitor, discharge with short taper  Chronic pancreatitis: - May be contributing to abdominal discomfort but seems primarily driven by gastritis. - Continue Creon with meals  Hypertension: - Resume home hydralazine and Lopressor. - BP currently appropriate  Mood disorder: -continueSeroquel, sertraline, and as needed hydroxyzine.  Substance use disorder: - History of cocaine use, UDS negative for cocaine this admission. THC is positive.  - He does seem to exhibit some narcotic seeking behavior per previous provider  Hypomagnesemia  Hypokalemia - replace and follow   Procedures: Echo IMPRESSIONS    1. Left ventricular ejection fraction, by estimation, is 60 to 65%. The  left ventricle has normal function. The left ventricle has no regional  wall motion abnormalities. Left ventricular diastolic parameters are  consistent with Grade I diastolic  dysfunction (impaired relaxation).  2. Right ventricular systolic function is normal. The right ventricular  size is normal. There is normal pulmonary artery systolic pressure.  3. The mitral valve is normal in structure. No evidence of mitral valve  regurgitation. No evidence of mitral stenosis.  4. The aortic valve is normal in structure. Aortic valve regurgitation is  not visualized. No aortic stenosis is present.  5. The inferior vena cava is normal in size with greater than 50%  respiratory variability, suggesting right atrial pressure of 3 mmHg.   Consultations:  none  Discharge Exam: Vitals:   08/29/19 0800 08/29/19 1140  BP: 111/71 110/82  Pulse: 77   Resp: 19   Temp: 97.9 F (36.6 C) 98.1 F (36.7 C)  SpO2: 100% 100%   C/o continued discomfort He notes this has been off/on for about [name] year He feels ok discharging, able to tolerate PO  General: No acute distress. Cardiovascular: Heart sounds show Mrk Buzby regular rate, and rhythm Lungs: Clear to  auscultation bilaterally w Abdomen: Soft, mildly tender in epigastric region which reproduces "chest pain" Neurological: Alert and oriented 3. Moves all extremities 4. Cranial nerves II through XII grossly intact. Skin: Warm and dry. No rashes or lesions. Extremities: No clubbing or cyanosis. No edema.  Discharge Instructions   Discharge Instructions    Call MD for:  difficulty breathing, headache or visual disturbances   Complete by: As directed    Call MD for:  extreme fatigue   Complete by: As directed    Call MD for:  hives   Complete by: As directed    Call MD for:  persistant dizziness or light-headedness   Complete by: As directed    Call MD for:  persistant nausea and vomiting   Complete by: As directed    Call MD for:  redness, tenderness, or signs of infection (pain, swelling, redness, odor or green/yellow discharge around incision site)   Complete by: As directed    Call MD for:  severe uncontrolled pain   Complete by: As directed    Call MD for:  temperature >100.4   Complete by: As directed    Diet - low sodium heart healthy   Complete by: As directed    Discharge instructions   Complete by: As directed    You were seen  for chest pain and abdominal pain.  This is most likely due to your chronic pancreatitis and gastritis.    Continue protonix twice daily.  Please follow up with your PCP within [facility] week outpatient to discuss your medications and symptoms.  Return for new, recurrent, or worsening symptoms.  Please ask your PCP to request records from this hospitalization so they know what was done and what the next steps will be.   Increase activity slowly   Complete by: As directed      Allergies as of 08/29/2019      Reactions   Robaxin [methocarbamol] Other (See Comments)   "jumpy limbs"   Shellfish-derived Products Nausea And Vomiting   Trazodone Other (See Comments)   Muscle spasms   Trazodone And Nefazodone Other (See Comments)   Muscle spasms    Adhesive [tape] Itching   Latex Itching   Toradol [ketorolac Tromethamine] Other (See Comments)   Has ulcers; cannot have this   Contrast Media [iodinated Diagnostic Agents] Hives   Reglan [metoclopramide] Other (See Comments)   Muscle spasms      Medication List    STOP taking these medications   omeprazole 20 MG capsule Commonly known as: PRILOSEC   oxyCODONE-acetaminophen 5-325 MG tablet Commonly known as: PERCOCET/ROXICET Replaced by: oxyCODONE-acetaminophen 7.5-325 MG tablet     TAKE these medications   acetaminophen 500 MG tablet Commonly known as: TYLENOL Take 2 tablets (1,000 mg total) by mouth every 8 (eight) hours as needed for mild pain. What changed: when to take this   chlordiazePOXIDE 10 MG capsule Commonly known as: LIBRIUM Take 1 capsule (10 mg total) by mouth 3 (three) times daily for 1 day, THEN 1 capsule (10 mg total) in the morning and at bedtime for 1 day, THEN 1 capsule (10 mg total) daily for 1 day. Start taking on: August 29, 2019   cyclobenzaprine 10 MG tablet Commonly known as: FLEXERIL Take 1 tablet (10 mg total) by mouth 2 (two) times daily as needed for muscle spasms.   famotidine 20 MG tablet Commonly known as: PEPCID Take 1 tablet (20 mg total) by mouth 2 (two) times daily.   folic acid 1 MG tablet Commonly known as: FOLVITE Take 1 tablet (1 mg total) by mouth daily.   hydrALAZINE 25 MG tablet Commonly known as: APRESOLINE Take 25 mg by mouth 3 (three) times daily.   hydrOXYzine 25 MG capsule Commonly known as: VISTARIL Take 25 mg by mouth 3 (three) times daily as needed for anxiety.   lipase/protease/amylase 12000 units Cpep capsule Commonly known as: CREON Take 3 capsules (36,000 Units total) by mouth 3 (three) times daily with meals. For pancreatitis What changed: how much to take   loratadine 10 MG tablet Commonly known as: CLARITIN Take 1 tablet (10 mg total) by mouth daily as needed for allergies or rhinitis. (May purchase  from over the counter): For allergies What changed: when to take this   LORazepam 0.5 MG tablet Commonly known as: ATIVAN Take 0.5 mg by mouth every 8 (eight) hours as needed for anxiety.   metoprolol tartrate 100 MG tablet Commonly known as: LOPRESSOR Take 100 mg by mouth 2 (two) times daily.   multivitamin with minerals Tabs tablet Take 1 tablet by mouth daily.   Nasonex 50 MCG/ACT nasal spray Generic drug: mometasone Place 2 sprays into the nose daily.   ondansetron 4 MG tablet Commonly known as: ZOFRAN Take 1 tablet (4 mg total) by mouth every 8 (eight) hours as  needed for nausea or vomiting.   oxyCODONE-acetaminophen 7.5-325 MG tablet Commonly known as: PERCOCET Take 1 tablet by mouth every 6 (six) hours as needed for up to 2 days for severe pain. Replaces: oxyCODONE-acetaminophen 5-325 MG tablet   pantoprazole 40 MG tablet Commonly known as: PROTONIX Take 1 tablet (40 mg total) by mouth 2 (two) times daily. What changed: when to take this   potassium chloride SA 20 MEQ tablet Commonly known as: KLOR-CON Take 1 tablet (20 mEq total) by mouth daily. What changed:   when to take this  reasons to take this   QUEtiapine 100 MG tablet Commonly known as: SEROQUEL Take 100 mg by mouth at bedtime.   sertraline 100 MG tablet Commonly known as: ZOLOFT Take 1 tablet (100 mg total) by mouth daily. What changed: how much to take   sucralfate 1 g tablet Commonly known as: Carafate Take 1 tablet (1 g total) by mouth 4 (four) times daily -  with meals and at bedtime.   thiamine 100 MG tablet Take 1 tablet (100 mg total) by mouth daily.   VITAMIN B-12 PO Take 1 tablet by mouth daily.      Allergies  Allergen Reactions  . Robaxin [Methocarbamol] Other (See Comments)    "jumpy limbs"  . Shellfish-Derived Products Nausea And Vomiting  . Trazodone Other (See Comments)    Muscle spasms  . Trazodone And Nefazodone Other (See Comments)    Muscle spasms  . Adhesive  [Tape] Itching  . Latex Itching  . Toradol [Ketorolac Tromethamine] Other (See Comments)    Has ulcers; cannot have this  . Contrast Media [Iodinated Diagnostic Agents] Hives  . Reglan [Metoclopramide] Other (See Comments)    Muscle spasms      The results of significant diagnostics from this hospitalization (including imaging, microbiology, ancillary and laboratory) are listed below for reference.    Significant Diagnostic Studies: CT ABDOMEN PELVIS WO CONTRAST  Result Date: 08/08/2019 CLINICAL DATA:  Chest pain. Abdominal pain. Concern for bowel obstruction. EXAM: CT ABDOMEN AND PELVIS WITHOUT CONTRAST TECHNIQUE: Multidetector CT imaging of the abdomen and pelvis was performed following the standard protocol without IV contrast. COMPARISON:  Chest and abdomen radiographs from earlier today. FINDINGS: Images obscured by streak artifact from external metallic objects, limiting assessment. Lower chest: No significant pulmonary nodules or acute consolidative airspace disease. Healed deformities in the lateral right seventh and eighth ribs. Hepatobiliary: Normal liver size. No liver mass. Normal gallbladder with no radiopaque cholelithiasis. No biliary ductal dilatation. Pancreas: Diffuse coarse pancreatic parenchymal calcifications, most prominent in the pancreatic head, unchanged, compatible with chronic pancreatitis. Marked beaded dilatation of main pancreatic duct in the pancreatic body and tail up to 3.2 cm diameter, previously 2.7 cm, mildly worsened. No definite acute peripancreatic fat stranding or fluid collections. Spleen: Normal size. No mass. Adrenals/Urinary Tract: Normal adrenals. No renal stones. No hydronephrosis. No contour deforming renal masses. Normal bladder. Stomach/Bowel: Normal non-distended stomach. No dilated small bowel loops. Scattered fluid levels in the small bowel. No small bowel wall thickening. Normal appendix. Few scattered fluid levels in the large bowel. No large bowel  wall thickening, diverticulosis or pericolonic fat stranding. Vascular/Lymphatic: Atherosclerotic nonaneurysmal abdominal aorta. No pathologically enlarged lymph nodes in the abdomen or pelvis. Reproductive: Normal size prostate. Other: No pneumoperitoneum, ascites or focal fluid collection. Musculoskeletal: No aggressive appearing focal osseous lesions. Mild degenerative disc disease in the lower lumbar spine. IMPRESSION: 1. Clear lung bases. 2. No evidence of bowel obstruction. 3. Scattered fluid levels  in the small and large bowel, suggesting ileus / malabsorptive state. No bowel wall thickening. 4. Chronic pancreatitis. Marked beaded dilatation of the main pancreatic duct in the pancreatic body and tail, mildly worsened since earlier noncontrast CT study. No evidence of superimposed acute pancreatitis. 5. Aortic Atherosclerosis (ICD10-I70.0). Electronically Signed   By: Ilona Sorrel M.D.   On: 08/08/2019 18:59   DG Chest 2 View  Result Date: 08/28/2019 CLINICAL DATA:  Left-sided chest pain and shortness of breath for 5 months, polysubstance abuse EXAM: CHEST - 2 VIEW COMPARISON:  08/25/2019 FINDINGS: The heart size and mediastinal contours are within normal limits. Both lungs are clear. The visualized skeletal structures are unremarkable. IMPRESSION: No active cardiopulmonary disease. Electronically Signed   By: Randa Ngo M.D.   On: 08/28/2019 17:26   DG Chest 2 View  Result Date: 08/25/2019 CLINICAL DATA:  Chest and abdominal pain EXAM: CHEST - 2 VIEW COMPARISON:  03/02/2019 FINDINGS: Frontal and lateral views of the chest demonstrate an unremarkable cardiac silhouette. No airspace disease, effusion, or pneumothorax. No acute bony abnormalities. IMPRESSION: 1. No acute intrathoracic process. Electronically Signed   By: Randa Ngo M.D.   On: 08/25/2019 00:26   DG Abd 1 View  Result Date: 08/25/2019 CLINICAL DATA:  Abdominal pain, history of peptic ulcer disease and chronic pancreatitis EXAM:  ABDOMEN - 1 VIEW COMPARISON:  08/08/2019 FINDINGS: Supine frontal view of the abdomen and pelvis excludes the hemidiaphragms by collimation. Bowel gas pattern is unremarkable without obstruction or ileus. No masses or abnormal calcifications. No acute bony abnormality. IMPRESSION: 1. Unremarkable bowel gas pattern. Electronically Signed   By: Randa Ngo M.D.   On: 08/25/2019 00:30   NM Pulmonary Perfusion  Result Date: 08/28/2019 CLINICAL DATA:  Left chest pain and shortness of breath, multi substance abuse EXAM: NUCLEAR MEDICINE PERFUSION LUNG SCAN TECHNIQUE: Perfusion images were obtained in multiple projections after intravenous injection of radiopharmaceutical. Ventilation scans intentionally deferred if perfusion scan and chest x-ray adequate for interpretation during COVID 19 epidemic. RADIOPHARMACEUTICALS:  1.52 mCi Tc-49m MAA IV COMPARISON:  08/28/2019 FINDINGS: Perfusion imaging of the chest was performed in multiple projections. There are no perfusion defects. Ventilation imaging was not performed per COVID 19 protocol. IMPRESSION: 1. Normal perfusion evaluation.  No evidence of pulmonary embolus. Electronically Signed   By: Randa Ngo M.D.   On: 08/28/2019 17:25   DG Abdomen Acute W/Chest  Result Date: 08/08/2019 CLINICAL DATA:  Epigastric pain EXAM: DG ABDOMEN ACUTE W/ 1V CHEST COMPARISON:  CT abdomen pelvis dated 11/24/2018 FINDINGS: Multiple mildly dilated loops of bowel are seen with air-fluid levels. Gas overlies the rectum. There is no evidence of free intraperitoneal air. No radiopaque calculi or other significant radiographic abnormality is seen. Heart size and mediastinal contours are within normal limits. There are minimal airspace opacities in the right lower lung. The left lung is clear. There is no pleural effusion or pneumothorax. IMPRESSION: 1. Mildly dilated loops of bowel with air-fluid levels as well as gas overlying the rectum. These findings may reflect early small-bowel  obstruction versus ileus. 2. Minimal airspace opacities in the right lower lung may represent aspiration/pneumonia. Electronically Signed   By: Zerita Boers M.D.   On: 08/08/2019 16:17   ECHOCARDIOGRAM COMPLETE  Result Date: 08/26/2019    ECHOCARDIOGRAM REPORT   Patient Name:   [facility] Date of Exam: 08/26/2019 Medical Rec #:  [id number]              Height:  68.0 in Accession #:    [id number]             Weight:       115.5 lb Date of Birth:  07-Jun-1970               BSA:          1.619 m Patient Age:    67 years               BP:           122/80 mmHg Patient Gender: M                      HR:           107 bpm. Exam Location:  Inpatient Procedure: 2D Echo Indications:    chest pain 786.50  History:        Patient has prior history of Echocardiogram examinations, most                 recent 01/09/2018. Signs/Symptoms:Chest Pain; Risk                 Factors:Hypertension, Current Smoker and alcohol abuse.  Sonographer:    Johny Chess Referring Phys: [id number] [place]  1. Left ventricular ejection fraction, by estimation, is 60 to 65%. The left ventricle has normal function. The left ventricle has no regional wall motion abnormalities. Left ventricular diastolic parameters are consistent with Grade I diastolic dysfunction (impaired relaxation).  2. Right ventricular systolic function is normal. The right ventricular size is normal. There is normal pulmonary artery systolic pressure.  3. The mitral valve is normal in structure. No evidence of mitral valve regurgitation. No evidence of mitral stenosis.  4. The aortic valve is normal in structure. Aortic valve regurgitation is not visualized. No aortic stenosis is present.  5. The inferior vena cava is normal in size with greater than 50% respiratory variability, suggesting right atrial pressure of 3 mmHg. FINDINGS  Left Ventricle: Left ventricular ejection fraction, by estimation, is 60 to 65%. The left ventricle has normal  function. The left ventricle has no regional wall motion abnormalities. The left ventricular internal cavity size was normal in size. There is  no left ventricular hypertrophy. Left ventricular diastolic parameters are consistent with Grade I diastolic dysfunction (impaired relaxation). Right Ventricle: The right ventricular size is normal. No increase in right ventricular wall thickness. Right ventricular systolic function is normal. There is normal pulmonary artery systolic pressure. The tricuspid regurgitant velocity is 2.25 m/s, and  with an assumed right atrial pressure of 3 mmHg, the estimated right ventricular systolic pressure is 123XX123 mmHg. Left Atrium: Left atrial size was normal in size. Right Atrium: Right atrial size was normal in size. Pericardium: There is no evidence of pericardial effusion. Mitral Valve: The mitral valve is normal in structure. Normal mobility of the mitral valve leaflets. Mild mitral annular calcification. No evidence of mitral valve regurgitation. No evidence of mitral valve stenosis. Tricuspid Valve: The tricuspid valve is normal in structure. Tricuspid valve regurgitation is not demonstrated. No evidence of tricuspid stenosis. Aortic Valve: The aortic valve is normal in structure. Aortic valve regurgitation is not visualized. No aortic stenosis is present. Pulmonic Valve: The pulmonic valve was normal in structure. Pulmonic valve regurgitation is not visualized. No evidence of pulmonic stenosis. Aorta: The aortic root is normal in size and structure. Venous: The inferior vena cava is normal in size with greater than 50% respiratory  variability, suggesting right atrial pressure of 3 mmHg. IAS/Shunts: No atrial level shunt detected by color flow Doppler.  LEFT VENTRICLE PLAX 2D LVIDd:         3.50 cm  Diastology LVIDs:         2.40 cm  LV e' lateral:   9.36 cm/s LV PW:         1.10 cm  LV E/e' lateral: 7.1 LV IVS:        0.90 cm  LV e' medial:    6.31 cm/s LVOT diam:     1.80 cm   LV E/e' medial:  10.6 LV SV:         40 LV SV Index:   25 LVOT Area:     2.54 cm  RIGHT VENTRICLE RV S prime:     10.20 cm/s TAPSE (M-mode): 1.5 cm LEFT ATRIUM             Index       RIGHT ATRIUM          Index LA diam:        2.20 cm 1.36 cm/m  RA Area:     6.95 cm LA Vol (A2C):   22.8 ml 14.09 ml/m RA Volume:   12.30 ml 7.60 ml/m LA Vol (A4C):   18.5 ml 11.43 ml/m LA Biplane Vol: 21.4 ml 13.22 ml/m  AORTIC VALVE LVOT Vmax:   118.00 cm/s LVOT Vmean:  81.900 cm/s LVOT VTI:    0.157 m MITRAL VALVE               TRICUSPID VALVE MV Area (PHT): 3.30 cm    TR Peak grad:   20.2 mmHg MV Decel Time: 230 msec    TR Vmax:        225.00 cm/s MV E velocity: 66.90 cm/s MV Saamir Armstrong velocity: 75.70 cm/s  SHUNTS MV E/Castella Lerner ratio:  0.88        Systemic VTI:  0.16 m                            Systemic Diam: 1.80 cm Candee Furbish MD Electronically signed by Candee Furbish MD Signature Date/Time: 08/26/2019/11:46:01 AM    Final     Microbiology: Recent Results (from the past 240 hour(s))  SARS CORONAVIRUS 2 (TAT 6-24 HRS) Nasopharyngeal Nasopharyngeal Swab     Status: None   Collection Time: 08/24/19 11:58 PM   Specimen: Nasopharyngeal Swab  Result Value Ref Range Status   SARS Coronavirus 2 NEGATIVE NEGATIVE Final    Comment: (NOTE) SARS-CoV-2 target nucleic acids are NOT DETECTED. The SARS-CoV-2 RNA is generally detectable in upper and lower respiratory specimens during the acute phase of infection. Negative results do not preclude SARS-CoV-2 infection, do not rule out co-infections with other pathogens, and should not be used as the sole basis for treatment or other patient management decisions. Negative results must be combined with clinical observations, patient history, and epidemiological information. The expected result is Negative. Fact Sheet for Patients: SugarRoll.be Fact Sheet for Healthcare Providers: https://www.woods-mathews.com/ This test is not yet approved or  cleared by the [place] FDA and  has been authorized for detection and/or diagnosis of SARS-CoV-2 by FDA under an Emergency Use Authorization (EUA). This EUA will remain  in effect (meaning this test can be used) for the duration of the COVID-19 declaration under Section 56 4(b)(1) of the Act, 21 U.S.C. section 360bbb-3(b)(1), unless the authorization is terminated or  revoked sooner. Performed at [facility], [place] [address] [place], [place] [address]      Labs: Basic Metabolic Panel: Recent Labs  Lab 08/24/19 1412 08/26/19 0934 08/27/19 0337 08/28/19 0225 08/29/19 0422  NA 134* 140 137 134* 135  K 4.8 4.3 3.6 3.4* 4.8  CL 102 108 107 105 109  CO2 20* 19* 21* 20* 19*  GLUCOSE 86 75 96 110* 96  BUN 7 <5* <5* <5* <5*  CREATININE 0.59* 0.58* 0.65 0.55* 0.61  CALCIUM 9.9 8.9 8.8* 8.1* 8.4*  MG  --  1.5* 1.3* 1.4* 1.7  PHOS  --   --  4.7*  --  3.3   Liver Function Tests: Recent Labs  Lab 08/24/19 1752 08/26/19 0934 08/27/19 0337 08/28/19 0225 08/29/19 0422  AST 38 46* 32 41 65*  ALT 31 26 24 28 30   ALKPHOS 82 80 63 63 69  BILITOT 0.6 0.6 0.5 0.2* 0.2*  PROT 7.4 6.6 5.9* 5.5* 5.5*  ALBUMIN 3.5 3.2* 2.8* 2.6* 2.9*   Recent Labs  Lab 08/24/19 1752  LIPASE 19   No results for input(s): AMMONIA in the last 168 hours. CBC: Recent Labs  Lab 08/24/19 1412 08/26/19 0934 08/27/19 0337 08/28/19 0225 08/29/19 0422  WBC 8.0 8.9 8.0 6.7 7.8  NEUTROABS  --  6.3 4.2 3.2 3.4  HGB 11.2* 12.0* 10.1* 9.2* 9.7*  HCT 35.4* 37.1* 31.3* 28.0* 29.7*  MCV 99.4 99.5 98.7 96.6 99.3  PLT 310 289 263 224 254   Cardiac Enzymes: No results for input(s): CKTOTAL, CKMB, CKMBINDEX, TROPONINI in the last 168 hours. BNP: BNP (last 3 results) No results for input(s): BNP in the last 8760 hours.  ProBNP (last 3 results) No results for input(s): PROBNP in the last 8760 hours.  CBG: No results for input(s): GLUCAP in the last 168 hours.     Signed:  Fayrene Helper  MD.  [facility] 08/29/2019, 3:23 PM

## 2019-08-29 NOTE — Progress Notes (Signed)
Discharge instructions provided to patient. All medications, follow up appointments, and discharge information provided. IV out. Monitor off CCMD notified. Patient discharging to home.  Paulene Floor, RN

## 2019-08-30 ENCOUNTER — Emergency Department (HOSPITAL_COMMUNITY)
Admission: EM | Admit: 2019-08-30 | Discharge: 2019-08-31 | Disposition: A | Discharge status: Home / Self Care | Payer: Medicaid Other | Attending: Emergency Medicine | Admitting: Emergency Medicine

## 2019-08-30 ENCOUNTER — Other Ambulatory Visit: Payer: Self-pay

## 2019-08-30 ENCOUNTER — Encounter (HOSPITAL_COMMUNITY): Payer: Self-pay | Admitting: Emergency Medicine

## 2019-08-30 DIAGNOSIS — F17210 Nicotine dependence, cigarettes, uncomplicated: Secondary | ICD-10-CM | POA: Diagnosis not present

## 2019-08-30 DIAGNOSIS — F1290 Cannabis use, unspecified, uncomplicated: Secondary | ICD-10-CM | POA: Diagnosis not present

## 2019-08-30 DIAGNOSIS — Z79899 Other long term (current) drug therapy: Secondary | ICD-10-CM | POA: Diagnosis not present

## 2019-08-30 DIAGNOSIS — R1013 Epigastric pain: Secondary | ICD-10-CM | POA: Diagnosis not present

## 2019-08-30 DIAGNOSIS — R112 Nausea with vomiting, unspecified: Secondary | ICD-10-CM | POA: Diagnosis not present

## 2019-08-30 DIAGNOSIS — I10 Essential (primary) hypertension: Secondary | ICD-10-CM | POA: Insufficient documentation

## 2019-08-30 DIAGNOSIS — Z91040 Latex allergy status: Secondary | ICD-10-CM | POA: Diagnosis not present

## 2019-08-30 DIAGNOSIS — R079 Chest pain, unspecified: Secondary | ICD-10-CM

## 2019-08-30 DIAGNOSIS — R0789 Other chest pain: Secondary | ICD-10-CM | POA: Diagnosis present

## 2019-08-30 DIAGNOSIS — R1012 Left upper quadrant pain: Secondary | ICD-10-CM | POA: Insufficient documentation

## 2019-08-30 DIAGNOSIS — R109 Unspecified abdominal pain: Secondary | ICD-10-CM

## 2019-08-30 DIAGNOSIS — R197 Diarrhea, unspecified: Secondary | ICD-10-CM | POA: Insufficient documentation

## 2019-08-30 LAB — COMPREHENSIVE METABOLIC PANEL
ALT: 36 U/L (ref 0–44)
AST: 51 U/L — ABNORMAL HIGH (ref 15–41)
Albumin: 3.2 g/dL — ABNORMAL LOW (ref 3.5–5.0)
Alkaline Phosphatase: 72 U/L (ref 38–126)
Anion gap: 12 (ref 5–15)
BUN: 6 mg/dL (ref 6–20)
CO2: 26 mmol/L (ref 22–32)
Calcium: 9.5 mg/dL (ref 8.9–10.3)
Chloride: 97 mmol/L — ABNORMAL LOW (ref 98–111)
Creatinine, Ser: 0.69 mg/dL (ref 0.61–1.24)
GFR calc Af Amer: >60 mL/min (ref >60)
GFR calc non Af Amer: >60 mL/min (ref >60)
Glucose, Bld: 96 mg/dL (ref 70–99)
Potassium: 4 mmol/L (ref 3.5–5.1)
Sodium: 135 mmol/L (ref 135–145)
Total Bilirubin: 0.4 mg/dL (ref 0.3–1.2)
Total Protein: 6.7 g/dL (ref 6.5–8.1)

## 2019-08-30 LAB — TROPONIN I (HIGH SENSITIVITY)
Troponin I (High Sensitivity): 5 ng/L (ref <18)
Troponin I (High Sensitivity): 7 ng/L (ref <18)

## 2019-08-30 LAB — CBC
HCT: 31 % — ABNORMAL LOW (ref 39.0–52.0)
Hemoglobin: 9.9 g/dL — ABNORMAL LOW (ref 13.0–17.0)
MCH: 31.3 pg (ref 26.0–34.0)
MCHC: 31.9 g/dL (ref 30.0–36.0)
MCV: 98.1 fL (ref 80.0–100.0)
Platelets: 273 10*3/uL (ref 150–400)
RBC: 3.16 MIL/uL — ABNORMAL LOW (ref 4.22–5.81)
RDW: 15 % (ref 11.5–15.5)
WBC: 8.8 10*3/uL (ref 4.0–10.5)
nRBC: 0 % (ref 0.0–0.2)

## 2019-08-30 LAB — URINALYSIS, ROUTINE W REFLEX MICROSCOPIC
Bilirubin Urine: NEGATIVE
Glucose, UA: NEGATIVE mg/dL
Hgb urine dipstick: NEGATIVE
Ketones, ur: NEGATIVE mg/dL
Leukocytes,Ua: NEGATIVE
Nitrite: NEGATIVE
Protein, ur: NEGATIVE mg/dL
Specific Gravity, Urine: 1.009 (ref 1.005–1.030)
pH: 8 (ref 5.0–8.0)

## 2019-08-30 LAB — LIPASE, BLOOD: Lipase: 23 U/L (ref 11–51)

## 2019-08-30 MED ORDER — SODIUM CHLORIDE 0.9% FLUSH: 3.0000 mL | Freq: Once | INTRAVENOUS | Status: DC

## 2019-08-30 NOTE — ED Triage Notes (Signed)
Pt BIB [facility], c/o abdominal pain and chest pain. Pt admitted to hospital and d/c yesterday, states his pain was manageable at d/c. States his prescriptions are not helping.

## 2019-08-31 ENCOUNTER — Encounter (HOSPITAL_COMMUNITY): Payer: Self-pay | Admitting: Student

## 2019-08-31 MED ORDER — ALUM & MAG HYDROXIDE-SIMETH 200-200-20 MG/5ML PO SUSP
30.0000 mL | Freq: Once | ORAL | Status: AC
  Administered 2019-08-31: 08:00:00 30 mL via ORAL
  Filled 2019-08-31: qty 30

## 2019-08-31 MED ORDER — LIDOCAINE VISCOUS HCL 2 % MT SOLN
15.0000 mL | Freq: Once | OROMUCOSAL | Status: AC
  Administered 2019-08-31: 15 mL via ORAL
  Filled 2019-08-31: qty 15

## 2019-08-31 NOTE — Discharge Instructions (Addendum)
You were seen in the emergency department today for chest and abdominal pain.  Your work-up was overall reassuring.  Your labs look similar to prior labs you have had done.  Your EKG and heart enzymes did not show an acute heart attack.  Please continue to take your medications as prescribed at home.  Please follow attached diet guidelines.  Please follow-up with GI or your primary care provider within the next 3 days.  Return to the ER for new or worsening symptoms including but not limited to worsening pain, blood in vomit or stool, passing out, trouble breathing, or any other concerns.

## 2019-08-31 NOTE — ED Provider Notes (Signed)
[facility] EMERGENCY DEPARTMENT Provider Note   CSN: [id number] Arrival date & time: 08/30/19  1805     History Chief Complaint  Patient presents with  . Chest Pain  . Abdominal Pain    [name] is a 49 y.o. male with a history of polysubstance abuse (EtOH, cocaine, marijuana, tobacco), chronic pancreatitis, GERD, & hypertension who returns to the ED with complaints of abdominal/chest pain that returned yesterday at 12PM. Patient states that he was feeling better s/p discharge from the hospital 04/22, however the "exact same pain" came back yesterday at noon. States pain is located in the epigastrium/LUQ and the L chest, it is sharp & burning in nature, currently an 8/10 in severity, worse with eating, no alleviating factors. States he is taking his creon, carafate, pepcid, and oxycodone he was discharged with without relief. Has had a few episodes of emesis & diarrhea- non bloody. Denies fever, chills, dyspnea, cough, melena, hematochezia, hematemesis, syncope,  leg pain/swelling, or hemoptysis. He states he has not had any EtOH since leaving the hospital.   HPI     Past Medical History:  Diagnosis Date  . Alcoholism /alcohol abuse ([place])   . Anemia   . Anxiety   . Arthritis    "knees; arms; elbows" (03/26/2015)  . Asthma   . Bipolar disorder ([place])   . Chronic bronchitis ([place])   . Chronic lower back pain   . Chronic pancreatitis ([place])   . Cocaine abuse ([place])   . Depression   . Family history of adverse reaction to anesthesia    "grandmother gets confused"  . Femoral condyle fracture ([place]) 03/08/2014   left medial/notes 03/09/2014  . GERD (gastroesophageal reflux disease)   . H/O hiatal hernia   . H/O suicide attempt 10/2012  . High cholesterol   . History of blood transfusion 10/2012   "when I tried to commit suicide"  . History of stomach ulcers   . Hypertension   . Marijuana abuse, continuous   . Migraine    "a few times/year"  (03/26/2015)  . Pneumonia 1990's X 3  . PTSD (post-traumatic stress disorder)   . Sickle cell trait ([place])   . WPW (Wolff-Parkinson-White syndrome)    [name] 03/06/2013    Patient Active Problem List   Diagnosis Date Noted  . AKI (acute kidney injury) ([place]) 11/13/2018  . Seizure ([place]) 11/13/2018  . Gastritis and gastroduodenitis   . Chest pain 01/08/2018  . GI bleed 11/24/2017  . Acute blood loss anemia 11/24/2017  . Atypical chest pain 11/24/2017  . Acute pancreatitis 09/28/2017  . Abdominal pain 05/27/2017  . Hematemesis 05/27/2017  . Tachycardia 03/18/2017  . Diarrhea 03/18/2017  . Acute on chronic pancreatitis ([place]) 12/17/2016  . Intractable nausea and vomiting 12/05/2016  . Verbally abusive behavior 12/05/2016  . Normocytic anemia 12/05/2016  . Alcohol use disorder, severe, dependence ([place]) 07/25/2016  . Cocaine use disorder, severe, dependence ([place]) 07/25/2016  . 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 Leukocytosis   . Hospital acquired PNA 05/20/2015  . Chronic pancreatitis ([place]) 05/18/2015  . Pseudocyst of pancreas 05/18/2015  . Polysubstance abuse (tobacco, cocaine, THC, and ETOH) 03/26/2015  . Alcohol-induced chronic pancreatitis ([place])   . Essential hypertension 02/06/2014  . Mood disorder ([place]) 02/06/2014  . Alcohol-induced acute pancreatitis 11/28/2013  . Pancreatic pseudocyst/cyst 11/25/2013  . Severe protein-calorie malnutrition ([place]) 10/10/2013  . Suicide attempt ([place]) 10/08/2013  . [name] Parkinson White pattern seen on electrocardiogram  10/03/2012  . TOBACCO ABUSE 03/23/2007    Past Surgical History:  Procedure Laterality Date  . BIOPSY  11/25/2017   Procedure: BIOPSY;  Surgeon: Arta Silence, MD;  Location: [facility] ENDOSCOPY;  Service: Endoscopy;;  . BIOPSY  10/14/2018   Procedure: BIOPSY;  Surgeon: Arta Silence, MD;  Location: [facility];  Service: Endoscopy;;  . CARDIAC CATHETERIZATION    .  ESOPHAGOGASTRODUODENOSCOPY (EGD) WITH PROPOFOL N/A 11/25/2017   Procedure: ESOPHAGOGASTRODUODENOSCOPY (EGD) WITH PROPOFOL;  Surgeon: Arta Silence, MD;  Location: [facility];  Service: Endoscopy;  Laterality: N/A;  . ESOPHAGOGASTRODUODENOSCOPY (EGD) WITH PROPOFOL Left 10/14/2018   Procedure: ESOPHAGOGASTRODUODENOSCOPY (EGD) WITH PROPOFOL;  Surgeon: Arta Silence, MD;  Location[facility] ENDOSCOPY;  Service: Endoscopy;  Laterality: Left;  . ESOPHAGOGASTRODUODENOSCOPY (EGD) WITH PROPOFOL N/A 11/14/2018   Procedure: ESOPHAGOGASTRODUODENOSCOPY (EGD) WITH PROPOFOL;  Surgeon: Laurence Spates, MD;  Location: [facility] ENDOSCOPY;  Service: Gastroenterology;  Laterality: N/A;  . EYE SURGERY Left 1990's   "result of trauma"   . FACIAL FRACTURE SURGERY Left 1990's   "result of trauma"   . FRACTURE SURGERY    . HERNIA REPAIR    . LEFT HEART CATHETERIZATION WITH CORONARY ANGIOGRAM Right 03/07/2013   Procedure: LEFT HEART CATHETERIZATION WITH CORONARY ANGIOGRAM;  Surgeon: [name], MD;  Location: [facility] CATH LAB;  Service: Cardiovascular;  Laterality: Right;  . UMBILICAL HERNIA REPAIR         Family History  Problem Relation Age of Onset  . Hypertension Mother   . Cirrhosis Mother   . Alcoholism Mother   . Hypertension Father   . Melanoma Father   . Hypertension Other   . Coronary artery disease Other     Social History   Tobacco Use  . Smoking status: Current Every Day Smoker    Packs/day: 1.00    Years: 33.00    Pack years: 33.00    Types: Cigarettes, E-cigarettes  . Smokeless tobacco: Never Used  Substance Use Topics  . Alcohol use: Yes  . Drug use: Yes    Types: Marijuana, Cocaine    Comment: daily marijuana use; last cocaine use about 3 months ago    Home Medications Prior to Admission medications   Medication Sig Start Date End Date Taking? Authorizing Provider  acetaminophen (TYLENOL) 500 MG tablet Take 2 tablets (1,000 mg total) by mouth every 8 (eight) hours as needed for mild pain.  08/29/19   Elodia Florence., MD  chlordiazePOXIDE (LIBRIUM) 10 MG capsule Take 1 capsule (10 mg total) by mouth 3 (three) times daily for 1 day, THEN 1 capsule (10 mg total) in the morning and at bedtime for 1 day, THEN 1 capsule (10 mg total) daily for 1 day. 08/29/19 09/01/19  Elodia Florence., MD  Cyanocobalamin (VITAMIN B-12 PO) Take 1 tablet by mouth daily.    [provider]  cyclobenzaprine (FLEXERIL) 10 MG tablet Take 1 tablet (10 mg total) by mouth 2 (two) times daily as needed for muscle spasms. 03/03/19   Recardo Evangelist, PA-C  famotidine (PEPCID) 20 MG tablet Take 1 tablet (20 mg total) by mouth 2 (two) times daily. 08/08/19   Carlisle Cater, PA-C  folic acid (FOLVITE) 1 MG tablet Take 1 tablet (1 mg total) by mouth daily. 11/26/17   [name], MD  hydrALAZINE (APRESOLINE) 25 MG tablet Take 25 mg by mouth 3 (three) times daily.     [provider]  hydrOXYzine (VISTARIL) 25 MG capsule Take 25 mg by mouth 3 (three)  times daily as needed for anxiety.  06/19/19   [provider]  lipase/protease/amylase (CREON) 12000 units CPEP capsule Take 3 capsules (36,000 Units total) by mouth 3 (three) times daily with meals. For pancreatitis 08/29/19   Elodia Florence., MD  loratadine (CLARITIN) 10 MG tablet Take 1 tablet (10 mg total) by mouth daily as needed for allergies or rhinitis. (May purchase from over the counter): For allergies Patient taking differently: Take 10 mg by mouth daily. (May purchase from over the counter): For allergies 10/01/17   Aline August, MD  LORazepam (ATIVAN) 0.5 MG tablet Take 0.5 mg by mouth every 8 (eight) hours as needed for anxiety.    [provider]  metoprolol tartrate (LOPRESSOR) 100 MG tablet Take 100 mg by mouth 2 (two) times daily.     [provider]  Multiple Vitamin (MULTIVITAMIN WITH MINERALS) TABS tablet Take 1 tablet by mouth daily. 09/06/17   Bonnell Public, MD  NASONEX 50 MCG/ACT nasal  spray Place 2 sprays into the nose daily. 06/19/19   [provider]  ondansetron (ZOFRAN) 4 MG tablet Take 1 tablet (4 mg total) by mouth every 8 (eight) hours as needed for nausea or vomiting. 11/24/18   Deliah Boston, PA-C  oxyCODONE-acetaminophen (PERCOCET) 7.5-325 MG tablet Take 1 tablet by mouth every 6 (six) hours as needed for up to 2 days for severe pain. 08/29/19 08/31/19  Elodia Florence., MD  pantoprazole (PROTONIX) 40 MG tablet Take 1 tablet (40 mg total) by mouth 2 (two) times daily. 08/29/19 09/28/19  Elodia Florence., MD  potassium chloride SA (K-DUR) 20 MEQ tablet Take 1 tablet (20 mEq total) by mouth daily. Patient taking differently: Take 20 mEq by mouth daily as needed (potassium).  01/07/19   Sherwood Gambler, MD  QUEtiapine (SEROQUEL) 100 MG tablet Take 100 mg by mouth at bedtime.    [provider]  sertraline (ZOLOFT) 100 MG tablet Take 1 tablet (100 mg total) by mouth daily. Patient taking differently: Take 150 mg by mouth daily.  08/29/17   Clent Demark, PA-C  sucralfate (CARAFATE) 1 g tablet Take 1 tablet (1 g total) by mouth 4 (four) times daily -  with meals and at bedtime. 08/08/19   Carlisle Cater, PA-C  thiamine 100 MG tablet Take 1 tablet (100 mg total) by mouth daily. 10/16/18   [name], MD  amitriptyline (ELAVIL) 25 MG tablet Take 1 tablet (25 mg total) by mouth at bedtime. Patient not taking: Reported on 08/08/2019 10/15/18 08/08/19  [name], MD  gabapentin (NEURONTIN) 100 MG capsule Take 1 capsule (100 mg total) by mouth 2 (two) times daily. Patient not taking: Reported on 08/08/2019 10/15/18 08/08/19  [name], MD  promethazine (PHENERGAN) 25 MG tablet Take 1 tablet (25 mg total) by mouth every 6 (six) hours as needed for nausea or vomiting. Patient not taking: Reported on 08/08/2019 03/02/19 08/08/19  Kinnie Feil, PA-C    Allergies    Robaxin [methocarbamol], Shellfish-derived products, Trazodone, Trazodone and  nefazodone, Adhesive [tape], Latex, Toradol [ketorolac tromethamine], Contrast media [iodinated diagnostic agents], and Reglan [metoclopramide]  Review of Systems   Review of Systems  Constitutional: Negative for chills and fever.  Respiratory: Negative for cough and shortness of breath.   Cardiovascular: Positive for chest pain. Negative for leg swelling.  Gastrointestinal: Positive for abdominal pain, diarrhea, nausea and vomiting. Negative for blood in stool.  Genitourinary: Negative for dysuria.  Skin: Negative  for color change and wound.  Neurological: Negative for syncope.  All other systems reviewed and are negative.  Physical Exam Updated Vital Signs BP (!) 124/91 (BP Location: Left Arm)   Pulse (!) 105   Temp 98.1 F (36.7 C) (Oral)   Resp 15   Ht 5\' 8"  (1.727 m)   Wt 55.8 kg   SpO2 94%   BMI 18.70 kg/m   Physical Exam Vitals and nursing note reviewed.  Constitutional:      General: He is not in acute distress.    Appearance: He is well-developed. He is not toxic-appearing.  HENT:     Head: Normocephalic and atraumatic.  Eyes:     General:        Right eye: No discharge.        Left eye: No discharge.     Conjunctiva/sclera: Conjunctivae normal.  Cardiovascular:     Rate and Rhythm: Normal rate and regular rhythm.     Pulses:          Radial pulses are 2+ on the right side and 2+ on the left side.  Pulmonary:     Effort: Pulmonary effort is normal. No respiratory distress.     Breath sounds: Normal breath sounds. No wheezing, rhonchi or rales.  Chest:     Chest wall: Tenderness (left lower chest wall) present.  Abdominal:     General: There is no distension.     Palpations: Abdomen is soft.     Tenderness: There is abdominal tenderness in the epigastric area and left upper quadrant. There is no guarding or rebound. Negative signs include Murphy's sign.  Musculoskeletal:     Cervical back: Neck supple.  Skin:    General: Skin is warm and dry.      Findings: No rash.  Neurological:     Mental Status: He is alert.     Comments: Clear speech.   Psychiatric:        Behavior: Behavior normal.    ED Results / Procedures / Treatments   Labs (all labs ordered are listed, but only abnormal results are displayed) Labs Reviewed  COMPREHENSIVE METABOLIC PANEL - Abnormal; Notable for the following components:      Result Value   Chloride 97 (*)    Albumin 3.2 (*)    AST 51 (*)    All other components within normal limits  CBC - Abnormal; Notable for the following components:   RBC 3.16 (*)    Hemoglobin 9.9 (*)    HCT 31.0 (*)    All other components within normal limits  URINALYSIS, ROUTINE W REFLEX MICROSCOPIC - Abnormal; Notable for the following components:   Color, Urine STRAW (*)    All other components within normal limits  LIPASE, BLOOD  TROPONIN I (HIGH SENSITIVITY)  TROPONIN I (HIGH SENSITIVITY)    EKG EKG Interpretation  Date/Time:  Friday August 30 2019 18:09:11 EDT Ventricular Rate:  98 PR Interval:  96 QRS Duration: 80 QT Interval:  304 QTC Calculation: 388 R Axis:   73 Text Interpretation: Sinus rhythm with short PR with frequent Premature ventricular complexes Right atrial enlargement T wave abnormality, consider lateral ischemia Abnormal ECG No STEMI, PAC noted Confirmed by Octaviano Glow [phone]) on 08/31/2019 7:31:30 AM   Radiology No results found.  Procedures Procedures (including critical care time)  Medications Ordered in ED Medications  sodium chloride flush (NS) 0.9 % injection 3 mL (has no administration in time range)    ED Course  I have reviewed the triage vital signs and the nursing notes.  Pertinent labs & imaging results that were available during my care of the patient were reviewed by me and considered in my medical decision making (see chart for details).    MDM Rules/Calculators/A&P                     This patient presents to the ED for complaints of chest/abdominal pain  which he states is the same as his recent admission. Patient is nontoxic, resting comfortably, vitals on my assessment are WNL, tachycardia from earlier vitals has normalized. Does not appear to have signs of withdrawal. Exam with reproducibility of pain with chest wall & epigastric/LUQ abdominal palpation, no peritoneal signs. DDX: ACS, PE, GERD, PUD, gastritis, acute/chronic pancreatitis, cholecystitis, cholelithiasis, chronic pain, dissection, PTX.    Additional history obtained:  Chart reviewed for additional history:  Recent hospital admission 04/17-04/22: Patient was admitted for chest and abdominal pain which was thought to be secondary to gastritis and chronic pancreatitis.  He had gradual improvement.  VQ scan was negative for PE.  Troponins were negative.  Patient has care plan in place which was also reviewed.   EKG: Sinus rhythm with short PR with frequent Premature ventricular complexes Right atrial enlargement T wave abnormality, consider lateral ischemia Abnormal ECG No STEMI, PAC noted Lab Tests:  Reviewed and interpreted labs, which included:  CBC: Anemia similar to prior. CMP: Mild AST elevation similar to prior.  No significant electrolyte derangement. Lipase: WNL Urinalysis: No UTI Troponin: No significant elevation.  Medicines ordered:  I ordered medication GI cocktail for pain.   Hear score 4, troponin without significant elevation, pain is nonexertional, recent admission with negative trending troponins with similar pain, doubt ACS.  Patient is low risk [place], VQ scan during recent admission was negative, doubt pulmonary embolism.  His labs are overall reassuring, no critical anemia or significant electrolyte derangement.  Lipase and LFTs without acute elevation.  Abdominal exam without peritoneal signs, do not suspect acute surgical abdomen.  His lungs are clear without decreased breath sounds, do not suspect pneumothorax.  No respiratory symptoms to raise concern for  pneumonia.  Symmetric pulses, pain is not a tearing sensation, doubt dissection.  Patient does not appear to be clinically withdrawing from alcohol at this time.  He has a history of chronic pain.  He was given a GI cocktail in the emergency department.  Requesting narcotics which do not appear appropriate at this time.  Will discharge home with instructions to follow-up with PCP and/or gastroenterology.  He is currently on PPI, H2 blocker, and Carafate which I recommended he continue.  I discussed results, treatment plan, need for follow-up, and return precautions with the patient. Provided opportunity for questions, patient confirmed understanding and is in agreement with plan.   Findings and plan of care discussed with supervising physician Dr. Langston Masker who is in agreement.   Portions of this note were generated with [employer]. Dictation errors may occur despite best attempts at proofreading.  Final Clinical Impression(s) / ED Diagnoses Final diagnoses:  Abdominal pain, unspecified abdominal location  Chest pain, unspecified type    Rx / DC Orders ED Discharge Orders    None       Amaryllis Dyke, PA-C 08/31/19 0756    Wyvonnia Dusky, MD 08/31/19 1735

## 2019-08-31 NOTE — ED Notes (Signed)
Pt d/c home per MD order. Discharge summary reviewed with pt, pt verbalizes understanding. Ambulatory off unit. Snack provided per Pt request. No s/s of acute distress noted.

## 2019-09-06 ENCOUNTER — Ambulatory Visit: Payer: Self-pay | Attending: Family Medicine | Admitting: Family Medicine

## 2019-09-06 ENCOUNTER — Encounter: Payer: Self-pay | Admitting: Family Medicine

## 2019-09-06 ENCOUNTER — Telehealth: Payer: Self-pay | Admitting: *Deleted

## 2019-09-06 ENCOUNTER — Other Ambulatory Visit: Payer: Self-pay

## 2019-09-06 VITALS — BP 124/86 | HR 90 | Temp 98.1°F | Ht 68.0 in | Wt 124.4 lb

## 2019-09-06 DIAGNOSIS — Z7689 Persons encountering health services in other specified circumstances: Secondary | ICD-10-CM

## 2019-09-06 DIAGNOSIS — I10 Essential (primary) hypertension: Secondary | ICD-10-CM

## 2019-09-06 DIAGNOSIS — K860 Alcohol-induced chronic pancreatitis: Secondary | ICD-10-CM

## 2019-09-06 DIAGNOSIS — R1030 Lower abdominal pain, unspecified: Secondary | ICD-10-CM

## 2019-09-06 DIAGNOSIS — R350 Frequency of micturition: Secondary | ICD-10-CM

## 2019-09-06 DIAGNOSIS — F17210 Nicotine dependence, cigarettes, uncomplicated: Secondary | ICD-10-CM

## 2019-09-06 DIAGNOSIS — K2920 Alcoholic gastritis without bleeding: Secondary | ICD-10-CM

## 2019-09-06 DIAGNOSIS — F1911 Other psychoactive substance abuse, in remission: Secondary | ICD-10-CM

## 2019-09-06 DIAGNOSIS — R1013 Epigastric pain: Secondary | ICD-10-CM

## 2019-09-06 DIAGNOSIS — J4520 Mild intermittent asthma, uncomplicated: Secondary | ICD-10-CM

## 2019-09-06 DIAGNOSIS — F1011 Alcohol abuse, in remission: Secondary | ICD-10-CM

## 2019-09-06 DIAGNOSIS — E43 Unspecified severe protein-calorie malnutrition: Secondary | ICD-10-CM

## 2019-09-06 DIAGNOSIS — Z09 Encounter for follow-up examination after completed treatment for conditions other than malignant neoplasm: Secondary | ICD-10-CM

## 2019-09-06 LAB — POCT URINALYSIS DIP (CLINITEK)
Bilirubin, UA: NEGATIVE
Blood, UA: NEGATIVE
Glucose, UA: NEGATIVE mg/dL
Ketones, POC UA: NEGATIVE mg/dL
Leukocytes, UA: NEGATIVE
Nitrite, UA: NEGATIVE
POC PROTEIN,UA: NEGATIVE
Spec Grav, UA: 1.025
Urobilinogen, UA: 0.2 U/dL
pH, UA: 6

## 2019-09-06 MED ORDER — PANTOPRAZOLE SODIUM 40 MG PO TBEC: 40.0000 mg | DELAYED_RELEASE_TABLET | Freq: Two times a day (BID) | ORAL | 11 refills | Status: DC

## 2019-09-06 MED ORDER — SUCRALFATE 1 G PO TABS: 1.0000 g | ORAL_TABLET | Freq: Three times a day (TID) | ORAL | 1 refills | Status: DC

## 2019-09-06 MED ORDER — CHLORDIAZEPOXIDE HCL 10 MG PO CAPS: 10.0000 mg | ORAL_CAPSULE | Freq: Three times a day (TID) | ORAL | 0 refills | Status: DC | PRN

## 2019-09-06 MED ORDER — PANCRELIPASE (LIP-PROT-AMYL) 12000-38000 UNITS PO CPEP: 36000.0000 [IU] | ORAL_CAPSULE | Freq: Three times a day (TID) | ORAL | 3 refills | Status: DC

## 2019-09-06 MED ORDER — OXYCODONE-ACETAMINOPHEN 7.5-325 MG PO TABS: 1.0000 | ORAL_TABLET | Freq: Three times a day (TID) | ORAL | 0 refills | Status: AC | PRN

## 2019-09-06 MED FILL — PANTOPRAZOLE SOD DR 40 MG T: 40 | 30 days supply | Qty: 60 | Fill #0

## 2019-09-06 MED FILL — SUCRALFATE 1 GM TABLET: 1 | 30 days supply | Qty: 120 | Fill #0

## 2019-09-06 MED FILL — CREON DR 12,000 UNITS CAP: 12000-38000 | 22 days supply | Qty: 200 | Fill #0

## 2019-09-06 NOTE — Telephone Encounter (Signed)
Opened in Error.
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
## 2019-09-06 NOTE — Progress Notes (Signed)
HFU  Need refe to GI, pain management and Pulmonologist

## 2019-09-06 NOTE — Patient Instructions (Addendum)
You were given short term prescriptions for pain medication and librium at today's visit but you will need to follow-up with pain management regarding future refills of controlled substances/narcotic medication.  Chronic Pancreatitis  Chronic pancreatitis is long-lasting inflammation and scarring of the pancreas. The pancreas is a gland that is located behind the stomach. It makes enzymes that help to digest food. The pancreas also releases hormones called glucagon and insulin, which help regulate blood sugar (glucose). Damage to the pancreas may affect digestion, cause pain in the upper abdomen and back, and cause diabetes. Inflammation can also irritate other organs in the abdomen near the pancreas. At first, pancreatitis may be sudden (acute). If you have several or prolonged episodes of acute pancreatitis, the condition can turn into chronic pancreatitis. What are the causes? The most common cause of this condition is alcohol abuse. Other causes include:  High (elevated) levels of triglycerides in the blood (hypertriglyceridemia).  Gallstones or other conditions that can block the tube that drains the pancreas (pancreatic duct).  Pancreatic cancer.  Cystic fibrosis.  Too much calcium in the blood (hypercalcemia), which may be caused by an overactive parathyroid gland (hyperparathyroidism).  Certain medicines.  Injury to the pancreas.  Infection.  Autoimmune pancreatitis. This is when the body's disease-fighting (immune) system attacks the pancreas.  Genes that are passed from parent to child (inherited). In some cases, the cause may not be known. What increases the risk? This condition is more likely to develop in:  Men.  People who are 30-93 years old.  People who have a family history of pancreatitis.  People who smoke tobacco.  People who drink large amounts of alcohol over a long period of time. What are the signs or symptoms? Symptoms of this condition may  include:  Pain in the abdomen or upper back. Pain may get worse after eating.  Nausea and vomiting.  Fever.  Weight loss.  A change in the color and consistency of bowel movements, such as stools that are oily, fatty, or clay-colored. How is this diagnosed? This condition is diagnosed based on your symptoms, your medical history, and a physical exam. You may have tests, such as:  Blood tests.  Stool samples.  Biopsy of the pancreas. This is the removal of a small amount of pancreas tissue to be tested in a lab.  Imaging tests, such as: ? X-rays. ? CT scan. ? MRI. ? Ultrasound. How is this treated? You may need to be treated at a hospital. Treatment may involve:  Resting the pancreas. You may need to stop eating and drinking for a few days to give your pancreas time to recover. During this time, you will be given IV fluids to keep you hydrated.  Controlling pain. You may be given pain medicines by mouth (orally) or as injections.  Improving digestion. You may be given: ? Medicines to replace your pancreatic enzymes. ? Vitamin supplements. ? A specific diet to follow. You may work with a diet and nutrition specialist (dietitian) to make an eating plan.  Surgery to: ? Clear the pancreatic ducts of any blockages, such as gallstones. ? Remove any fluid or damaged tissue from the pancreas. Other treatments may include:  Preventing diabetes. Your health care provider may recommend that you: ? Get regular screening tests for diabetes. ? Monitor your blood glucose regularly.  Lifestyle changes, such as stopping alcohol use.  Steroid medicines, if your condition is caused by your immune system attacking your body's own tissues (autoimmune disease). Follow these  instructions at home: Eating and drinking      Do not drink alcohol. If you need help quitting, ask your health care provider.  Follow a diet as told by your health care provider or dietitian, if this applies.  This may include: ? Limiting how much fat you eat. ? Eating smaller meals more often. ? Avoiding caffeine.  Drink enough fluid to keep your urine pale yellow. General instructions  Take over-the-counter and prescription medicines only as told by your health care provider. These include vitamin supplements.  Do not drive or use heavy machinery while taking prescription pain medicine.  If you are taking prescription pain medicine, take actions to prevent or treat constipation. Your health care provider may recommend that you: ? Take an over-the-counter or prescription medicine for constipation. ? Eat foods that are high in fiber such as whole grains and beans. ? Limit foods that are high in fat and processed sugars, such as fried or sweet foods.  Do not use any products that contain nicotine or tobacco, such as cigarettes and e-cigarettes. If you need help quitting, ask your health care provider.  If recommended by your health care provider, monitor your blood glucose at home.  Keep all follow-up visits as told by your health care provider. This is important. Contact a health care provider if:  You have pain that does not get better with medicine.  You have a fever.  You have sudden weight loss. Get help right away if:  Your pain suddenly gets worse.  You have sudden swelling in your abdomen.  You start to vomit often.  You vomit blood.  You have diarrhea that does not go away.  You have blood in your stool.  You become confused or you have trouble thinking clearly. Summary  Chronic pancreatitis is long-lasting inflammation and scarring of the pancreas. Damage to the pancreas may affect digestion, cause pain in the upper abdomen and back, and cause diabetes. Inflammation can also irritate other organs in the abdomen near the pancreas.  Common causes of this condition are alcohol abuse, gallstones, high (elevated) levels of triglycerides, and certain medicines.  This  condition is sometimes treated at a hospital and may involve resting the pancreas, controlling pain, replacing enzymes, and avoiding alcohol. This information is not intended to replace advice given to you by your health care provider. Make sure you discuss any questions you have with your health care provider. Document Revised: 11/29/2018 Document Reviewed: 12/23/2016 Elsevier Patient Education  [employer].

## 2019-09-07 LAB — COMPREHENSIVE METABOLIC PANEL WITH GFR
ALT: 27 IU/L (ref 0–44)
AST: 39 IU/L (ref 0–40)
Albumin/Globulin Ratio: 1.3 (ref 1.2–2.2)
Albumin: 3.9 g/dL — ABNORMAL LOW (ref 4.0–5.0)
Alkaline Phosphatase: 86 IU/L (ref 39–117)
BUN/Creatinine Ratio: 7 — ABNORMAL LOW (ref 9–20)
BUN: 5 mg/dL — ABNORMAL LOW (ref 6–24)
Bilirubin Total: 0.2 mg/dL (ref 0.0–1.2)
CO2: 19 mmol/L — ABNORMAL LOW (ref 20–29)
Calcium: 9.2 mg/dL (ref 8.7–10.2)
Chloride: 104 mmol/L (ref 96–106)
Creatinine, Ser: 0.71 mg/dL — ABNORMAL LOW (ref 0.76–1.27)
GFR calc Af Amer: 126 mL/min/1.73
GFR calc non Af Amer: 109 mL/min/1.73
Globulin, Total: 2.9 g/dL (ref 1.5–4.5)
Glucose: 82 mg/dL (ref 65–99)
Potassium: 4.6 mmol/L (ref 3.5–5.2)
Sodium: 141 mmol/L (ref 134–144)
Total Protein: 6.8 g/dL (ref 6.0–8.5)

## 2019-09-07 LAB — LIPASE: Lipase: 26 U/L (ref 13–78)

## 2019-09-08 IMAGING — CT CT ABD-PELV W/O CM
2 of 4 series · 16 of 46 positions shown, 18 images · non-contrast
Comparison: 03/05/2017 head older exams

CLINICAL DATA: Epigastric abdominal pain since 7877am with nausea
and vomiting. Elevated WBC count. History of pancreatitis

EXAM:
CT ABDOMEN AND PELVIS WITHOUT CONTRAST
TECHNIQUE: Multidetector CT imaging of the abdomen and pelvis was performed
following the standard protocol without IV contrast.

[Series 3: ap without · axial · non-contrast · 0.61mm/px · z∈[-1240,-875]mm · 13 of 83 slices shown, 15 images]
[im 5/83  soft-tissue]
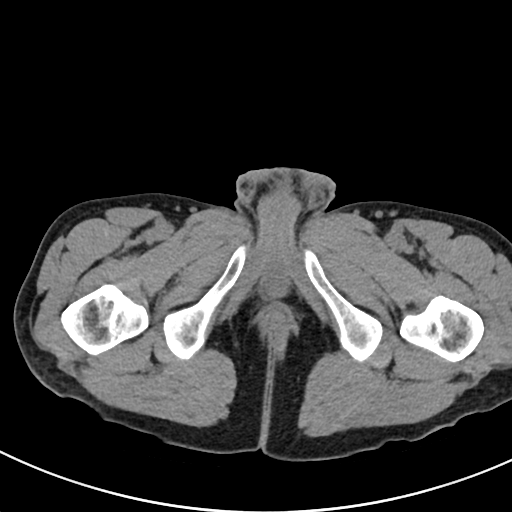
[im 5/83  bone]
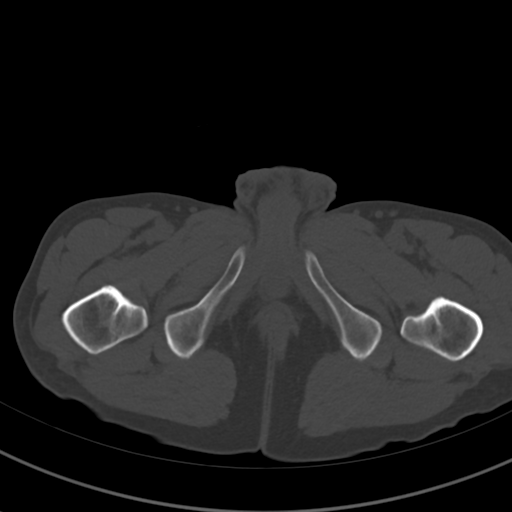
[im 13/83  soft-tissue]
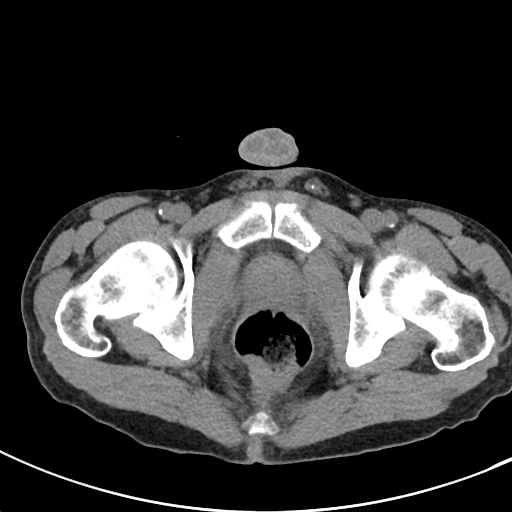
[im 18/83  soft-tissue]
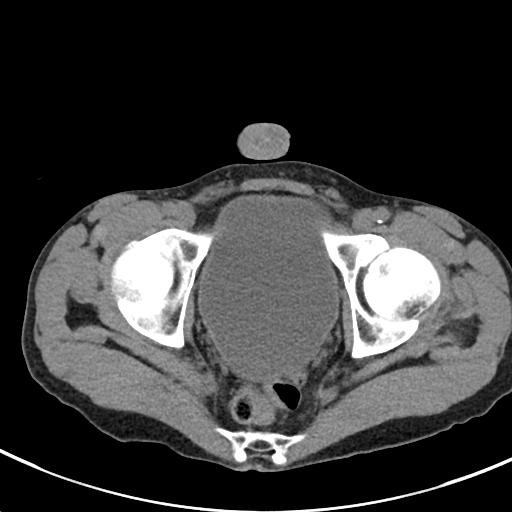
[im 22/83  soft-tissue]
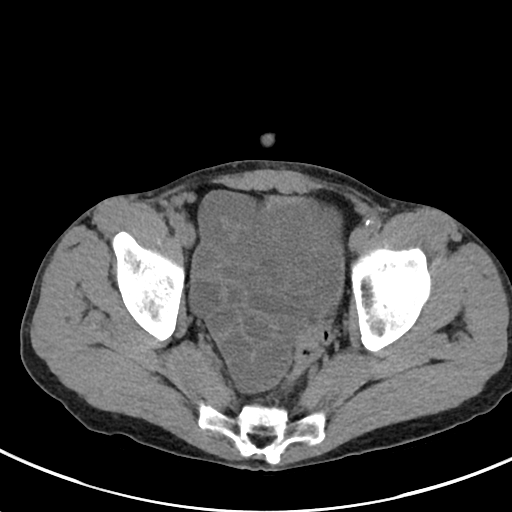
[im 31/83  soft-tissue]
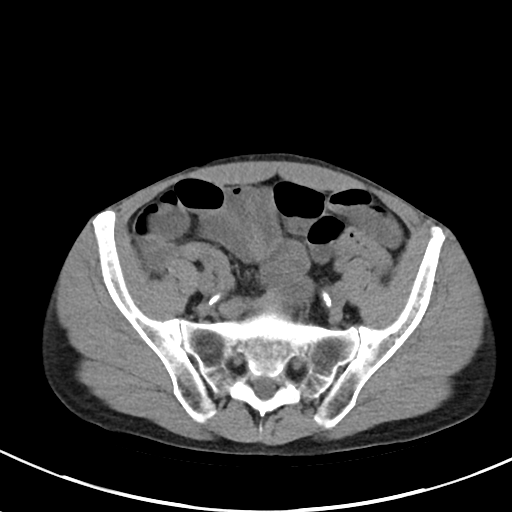
[im 35/83  soft-tissue]
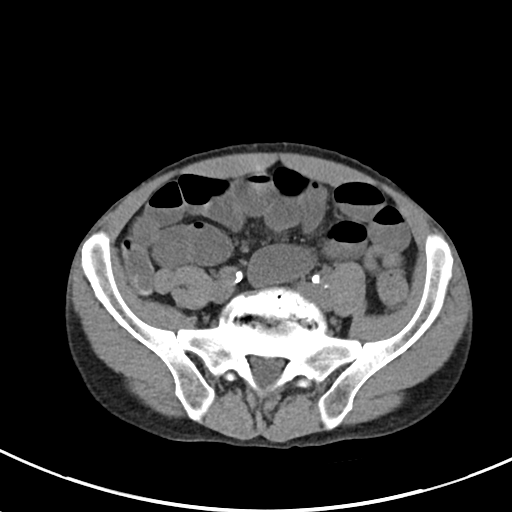
[im 44/83  soft-tissue]
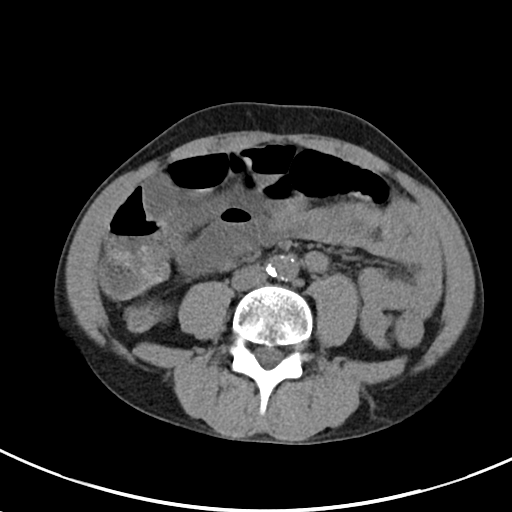
[im 48/83  soft-tissue]
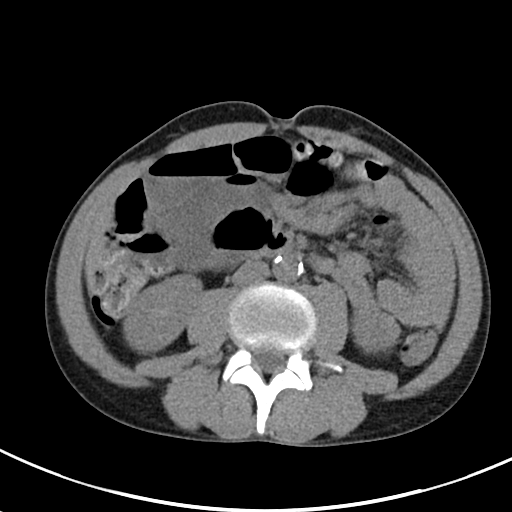
[im 52/83  soft-tissue]
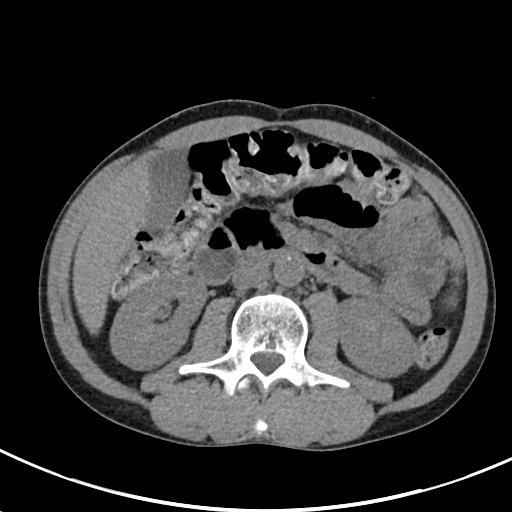
[im 52/83  bone]
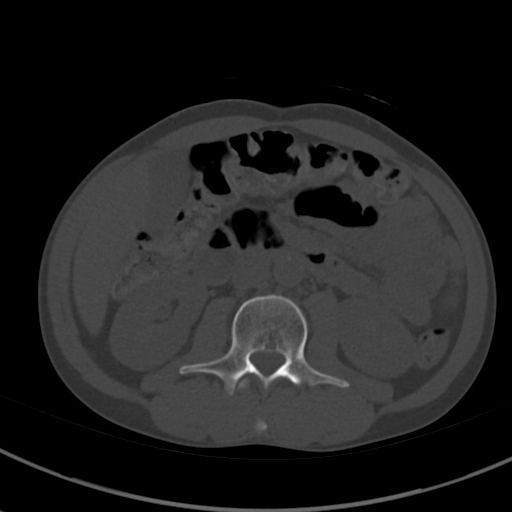
[im 61/83  soft-tissue]
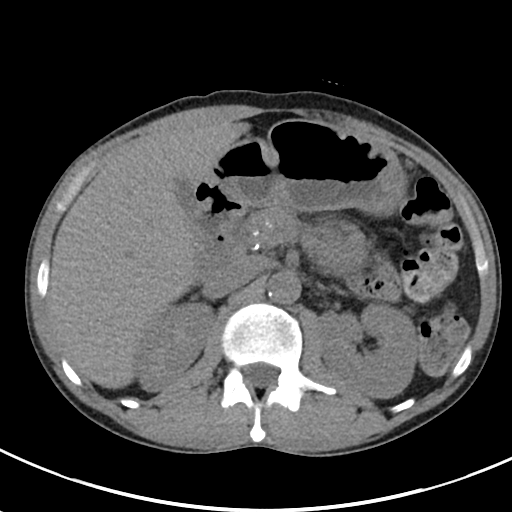
[im 65/83  soft-tissue]
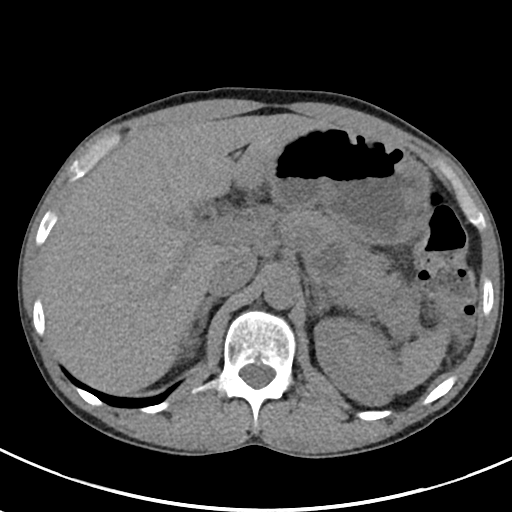
[im 70/83  soft-tissue]
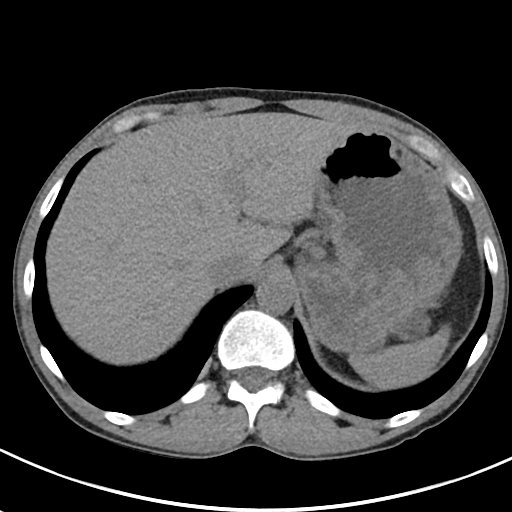
[im 78/83  soft-tissue]
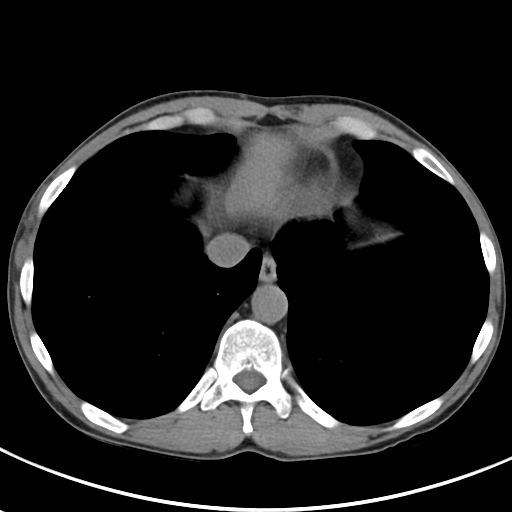

[Series 6: cor · coronal · 0.68mm/px · 3 of 71 slices shown]
[im 24/71  soft-tissue]
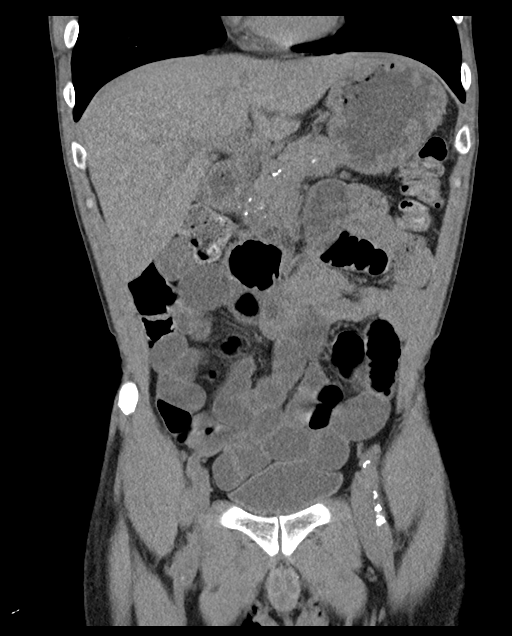
[im 32/71  soft-tissue]
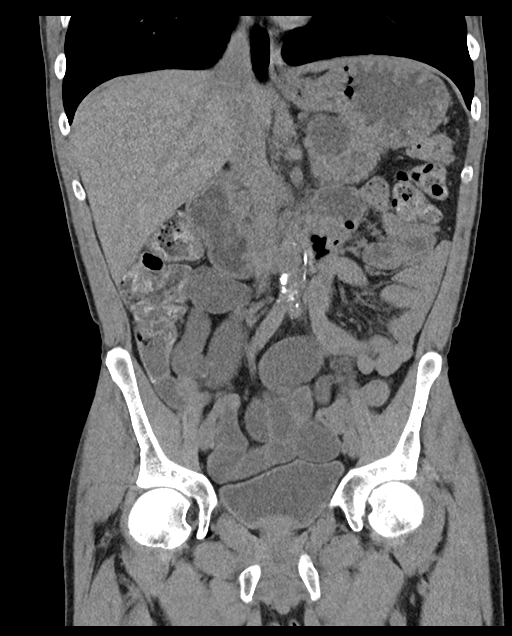
[im 39/71  soft-tissue]
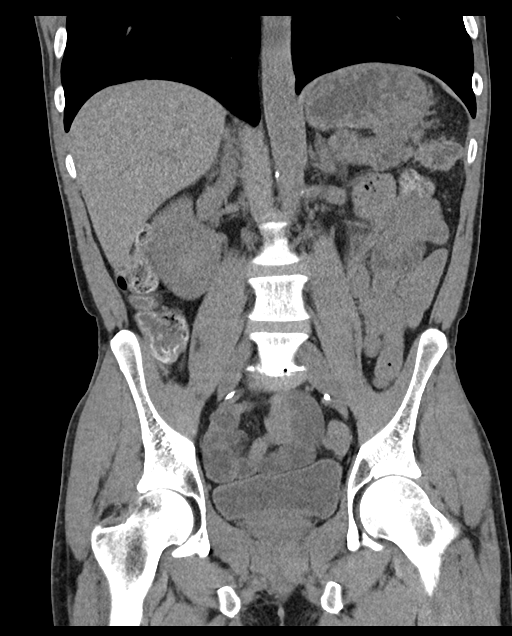

[16 of 46 positions shown; findings below may reference images not displayed]

FINDINGS: Lower chest: No acute abnormality.

Hepatobiliary: No focal liver abnormality is seen. No gallstones,
gallbladder wall thickening, or biliary dilatation.

Pancreas: 2.5 cm well-defined cyst in the pancreatic tail,, with
smaller cyst more distally in the pancreatic tail, stable from the
recent prior exam. Calcifications in the pancreatic head, neck and
body, also stable. No pancreatic inflammation.

Spleen: Normal in size without focal abnormality.

Adrenals/Urinary Tract: Adrenal glands are unremarkable. Kidneys are
normal, without renal calculi, focal lesion, or hydronephrosis.
Bladder is unremarkable.

Stomach/Bowel: Stomach is unremarkable. Mild prominence of the small
bowel is seen with multiple air-fluid levels. There is no small
bowel wall thickening or adjacent inflammation. Colon is
unremarkable. Normal appendix visualized.

Vascular/Lymphatic: Aortic atherosclerotic calcifications. No
enlarged lymph nodes.

Reproductive: Unremarkable.

Other: No abdominal wall hernia or abnormality. No abdominopelvic
ascites.

Musculoskeletal: No fracture or acute finding. Disc degenerative
changes at L5-S1. No osteoblastic or osteolytic lesions.
IMPRESSION: 1. Prominent small bowel with air-fluid levels. No evidence of bowel
obstruction. This is consistent with a mild adynamic ileus.
2. No other acute abnormality.
3. Multiple pancreatic cysts, largest measuring 2.5 cm, stable from
the most recent prior exam. These are consistent with pseudocysts.
Pancreatic calcifications are also stable consistent with chronic
pancreatitis.

## 2019-09-09 ENCOUNTER — Telehealth: Payer: Self-pay | Admitting: Family Medicine

## 2019-09-09 ENCOUNTER — Other Ambulatory Visit: Payer: Self-pay | Admitting: Family Medicine

## 2019-09-09 DIAGNOSIS — I10 Essential (primary) hypertension: Secondary | ICD-10-CM

## 2019-09-09 MED ORDER — METOPROLOL TARTRATE 100 MG PO TABS: 100.0000 mg | ORAL_TABLET | Freq: Two times a day (BID) | ORAL | 4 refills | Status: DC

## 2019-09-09 MED ORDER — HYDRALAZINE HCL 25 MG PO TABS: 25.0000 mg | ORAL_TABLET | Freq: Three times a day (TID) | ORAL | 4 refills | Status: DC

## 2019-09-09 NOTE — Progress Notes (Signed)
Patient ID: [name], male   DOB: 1970/10/04, 49 y.o.   MRN: [id number]   49 year old male requesting refill blood pressure medications per phone message.  Prescriptions will be sent to patient's pharmacy for hydralazine 3 times daily and metoprolol twice daily as per recent hospital discharge notes.  Prescription sent to [facility]

## 2019-09-09 NOTE — Telephone Encounter (Signed)
Notify patient that I discussed with him at his visit that oxycodone would not be prescribed to him long term. On his recent blood work his lipase was normal so he should not be having significant pain related to his history of pancreatitis. He should keep his follow-up appointment with GI. I will refill his BP medications to this pharmacy as per his most recent hospital discharge. If he develops recurrence of acute pain then he may need to be seen at the ED for follow-up

## 2019-09-09 NOTE — Telephone Encounter (Signed)
Patient came into the office and requested for a refill on oxyCODONE-acetaminophen (PERCOCET) 7.5-325 MG tablet [id number]  Patient stated that he was not able to receive and appointment to requested referrals until his medical records were sent. Patient is filling out medical record information today in the office.

## 2019-09-09 NOTE — Telephone Encounter (Signed)
Patient is also requesting his blood pressure medications and it has not been filled by this office. Per pt he is also requesting refills for his Oxycodone. Per pt the referring place will not see him until his records are received and he only have 3 more tablets left and would like to see if you could fill this medication as well

## 2019-09-09 NOTE — Telephone Encounter (Signed)
Informed patient with results and he verbalized understanding.

## 2019-09-16 ENCOUNTER — Telehealth: Payer: Self-pay | Admitting: *Deleted

## 2019-09-16 NOTE — Telephone Encounter (Signed)
Spoke with patient and he stated he does not have insurance and is currently trying to work on getting one. Per pt he can not afford to see the pain clinic or the GI doctor. Per pt he was wondering if provider can give him about 6-8 more of the oxycodone for break through pain.

## 2019-09-16 NOTE — Telephone Encounter (Signed)
He was made aware at his last visit that he would not receive additional narcotic pain medication and his recent lipase was normal indicating no current pancreatitis

## 2019-09-17 NOTE — Telephone Encounter (Signed)
Informed patient with what provider stated and he verbalized understanding and stated he did not want to hear that anymore.

## 2019-09-19 ENCOUNTER — Emergency Department (HOSPITAL_COMMUNITY): Payer: Medicaid Other

## 2019-09-19 ENCOUNTER — Telehealth: Payer: Self-pay | Admitting: Family Medicine

## 2019-09-19 ENCOUNTER — Emergency Department (HOSPITAL_COMMUNITY)
Admission: EM | Admit: 2019-09-19 | Discharge: 2019-09-19 | Disposition: A | Discharge status: Home / Self Care | Payer: Medicaid Other | Attending: Emergency Medicine | Admitting: Emergency Medicine

## 2019-09-19 ENCOUNTER — Other Ambulatory Visit: Payer: Self-pay

## 2019-09-19 ENCOUNTER — Encounter: Payer: Self-pay | Admitting: Gastroenterology

## 2019-09-19 DIAGNOSIS — J45909 Unspecified asthma, uncomplicated: Secondary | ICD-10-CM | POA: Insufficient documentation

## 2019-09-19 DIAGNOSIS — R079 Chest pain, unspecified: Secondary | ICD-10-CM

## 2019-09-19 DIAGNOSIS — Z79899 Other long term (current) drug therapy: Secondary | ICD-10-CM | POA: Diagnosis not present

## 2019-09-19 DIAGNOSIS — R0789 Other chest pain: Secondary | ICD-10-CM | POA: Diagnosis present

## 2019-09-19 DIAGNOSIS — I10 Essential (primary) hypertension: Secondary | ICD-10-CM | POA: Insufficient documentation

## 2019-09-19 DIAGNOSIS — R1013 Epigastric pain: Secondary | ICD-10-CM | POA: Diagnosis not present

## 2019-09-19 DIAGNOSIS — F17210 Nicotine dependence, cigarettes, uncomplicated: Secondary | ICD-10-CM | POA: Diagnosis not present

## 2019-09-19 LAB — COMPREHENSIVE METABOLIC PANEL
ALT: 31 U/L (ref 0–44)
AST: 36 U/L (ref 15–41)
Albumin: 3.2 g/dL — ABNORMAL LOW (ref 3.5–5.0)
Alkaline Phosphatase: 65 U/L (ref 38–126)
Anion gap: 12 (ref 5–15)
BUN: 10 mg/dL (ref 6–20)
CO2: 20 mmol/L — ABNORMAL LOW (ref 22–32)
Calcium: 8.2 mg/dL — ABNORMAL LOW (ref 8.9–10.3)
Chloride: 109 mmol/L (ref 98–111)
Creatinine, Ser: 0.75 mg/dL (ref 0.61–1.24)
GFR calc Af Amer: >60 mL/min (ref >60)
GFR calc non Af Amer: >60 mL/min (ref >60)
Glucose, Bld: 103 mg/dL — ABNORMAL HIGH (ref 70–99)
Potassium: 3.8 mmol/L (ref 3.5–5.1)
Sodium: 141 mmol/L (ref 135–145)
Total Bilirubin: 0.7 mg/dL (ref 0.3–1.2)
Total Protein: 6.2 g/dL — ABNORMAL LOW (ref 6.5–8.1)

## 2019-09-19 LAB — CBC WITH DIFFERENTIAL/PLATELET
Abs Immature Granulocytes: 0.05 10*3/uL (ref 0.00–0.07)
Basophils Absolute: 0 10*3/uL (ref 0.0–0.1)
Basophils Relative: 0 %
Eosinophils Absolute: 0.3 10*3/uL (ref 0.0–0.5)
Eosinophils Relative: 3 %
HCT: 29.1 % — ABNORMAL LOW (ref 39.0–52.0)
Hemoglobin: 9.2 g/dL — ABNORMAL LOW (ref 13.0–17.0)
Immature Granulocytes: 1 %
Lymphocytes Relative: 18 %
Lymphs Abs: 1.9 10*3/uL (ref 0.7–4.0)
MCH: 29.5 pg (ref 26.0–34.0)
MCHC: 31.6 g/dL (ref 30.0–36.0)
MCV: 93.3 fL (ref 80.0–100.0)
Monocytes Absolute: 0.7 10*3/uL (ref 0.1–1.0)
Monocytes Relative: 7 %
Neutro Abs: 7.4 10*3/uL (ref 1.7–7.7)
Neutrophils Relative %: 71 %
Platelets: 237 10*3/uL (ref 150–400)
RBC: 3.12 MIL/uL — ABNORMAL LOW (ref 4.22–5.81)
RDW: 16.3 % — ABNORMAL HIGH (ref 11.5–15.5)
WBC: 10.3 10*3/uL (ref 4.0–10.5)
nRBC: 0 % (ref 0.0–0.2)

## 2019-09-19 LAB — TROPONIN I (HIGH SENSITIVITY)
Troponin I (High Sensitivity): 5 ng/L (ref <18)
Troponin I (High Sensitivity): 5 ng/L (ref <18)

## 2019-09-19 LAB — LIPASE, BLOOD: Lipase: 21 U/L (ref 11–51)

## 2019-09-19 MED ORDER — ALUM & MAG HYDROXIDE-SIMETH 200-200-20 MG/5ML PO SUSP
30.0000 mL | Freq: Once | ORAL | Status: AC
  Administered 2019-09-19: 30 mL via ORAL
  Filled 2019-09-19: qty 30

## 2019-09-19 MED ORDER — PANTOPRAZOLE SODIUM 40 MG IV SOLR
40.0000 mg | Freq: Once | INTRAVENOUS | Status: AC
  Administered 2019-09-19: 40 mg via INTRAVENOUS
  Filled 2019-09-19: qty 40

## 2019-09-19 MED ORDER — LIDOCAINE VISCOUS HCL 2 % MT SOLN
15.0000 mL | Freq: Once | OROMUCOSAL | Status: AC
  Administered 2019-09-19: 15 mL via ORAL
  Filled 2019-09-19: qty 15

## 2019-09-19 MED ORDER — SUCRALFATE 1 G PO TABS
1.0000 g | ORAL_TABLET | Freq: Once | ORAL | Status: AC
  Administered 2019-09-19: 1 g via ORAL
  Filled 2019-09-19: qty 1

## 2019-09-19 NOTE — ED Triage Notes (Signed)
Pt from home via ems; c/o central cp that began 1 hour ago, sharp, radiated to L neck and jaw, some dizziness when it began and some sob, resolved at this time; chronic nausea from pancreatitis but pt states it "feels different"; pt refused ASA with ems, pt endorses taking ED meds last night so no nitro given; pt admitted to hospital at end of April for hyperkalemia, referred to cardiologist, mentions "need for cath" with ems; ems states pt has been off alcohol x 1 week, 3 days off oxycodone that [facility]

## 2019-09-19 NOTE — ED Provider Notes (Addendum)
[facility] EMERGENCY DEPARTMENT Provider Note   CSN: [id number] Arrival date & time: 09/19/19  1127     History Chief Complaint  Patient presents with  . Chest Pain    [name] is a 49 y.o. male.  [name] is a 49 y.o. male with a history of chronic pancreatitis, polysubstance abuse, chronic abdominal pain, GERD, who presents to the emergency department for evaluation of left-sided chest pain and epigastric pain.  Patient states that pain started about an hour prior to arrival and it described as a sharp constant pain.  He reports that he initially had some radiation up towards the neck and jaw, and felt a bit lightheaded and short of breath.  Pain then seemed to improve.  He reports he has chronic nausea associated with his pancreatitis and has not had any vomiting.  Denies any diaphoresis.  Reports no abnormal bowel movements.  States he has been taking all of his medications regularly, including his Creon, Carafate and Protonix, although he does not have his Protonix with him, he does state he thinks he has been taking it.  Reports he has been taking oxycodone that was prescribed to him but denies any improvement in his pain with this.  States he has not had any alcohol in a week.  Had admission in April for similar symptoms, and was treated and his pain improved.  Had negative VQ scan at this time and reassuring cardiac work-up.        Past Medical History:  Diagnosis Date  . Alcoholism /alcohol abuse ([place])   . Anemia   . Anxiety   . Arthritis    "knees; arms; elbows" (03/26/2015)  . Asthma   . Bipolar disorder ([place])   . Chronic bronchitis ([place])   . Chronic lower back pain   . Chronic pancreatitis ([place])   . Cocaine abuse ([place])   . Depression   . Family history of adverse reaction to anesthesia    "grandmother gets confused"  . Femoral condyle fracture ([place]) 03/08/2014   left medial/notes 03/09/2014  . GERD (gastroesophageal  reflux disease)   . H/O hiatal hernia   . H/O suicide attempt 10/2012  . High cholesterol   . History of blood transfusion 10/2012   "when I tried to commit suicide"  . History of stomach ulcers   . Hypertension   . Marijuana abuse, continuous   . Migraine    "a few times/year" (03/26/2015)  . Pneumonia 1990's X 3  . PTSD (post-traumatic stress disorder)   . Sickle cell trait ([place])   . WPW (Wolff-Parkinson-White syndrome)    [name] 03/06/2013    Patient Active Problem List   Diagnosis Date Noted  . AKI (acute kidney injury) ([place]) 11/13/2018  . Seizure ([place]) 11/13/2018  . Gastritis and gastroduodenitis   . Chest pain 01/08/2018  . GI bleed 11/24/2017  . Acute blood loss anemia 11/24/2017  . Atypical chest pain 11/24/2017  . Acute pancreatitis 09/28/2017  . Abdominal pain 05/27/2017  . Hematemesis 05/27/2017  . Tachycardia 03/18/2017  . Diarrhea 03/18/2017  . Acute on chronic pancreatitis ([place]) 12/17/2016  . Intractable nausea and vomiting 12/05/2016  . Verbally abusive behavior 12/05/2016  . Normocytic anemia 12/05/2016  . Alcohol use disorder, severe, dependence ([place]) 07/25/2016  . Cocaine use disorder, severe, dependence ([place]) 07/25/2016  . 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 Leukocytosis   . Hospital acquired PNA 05/20/2015  . Chronic pancreatitis (  [place]) 05/18/2015  . Pseudocyst of pancreas 05/18/2015  . Polysubstance abuse (tobacco, cocaine, THC, and ETOH) 03/26/2015  . Alcohol-induced chronic pancreatitis ([place])   . Essential hypertension 02/06/2014  . Mood disorder ([place]) 02/06/2014  . Alcohol-induced acute pancreatitis 11/28/2013  . Pancreatic pseudocyst/cyst 11/25/2013  . Severe protein-calorie malnutrition ([place]) 10/10/2013  . Suicide attempt ([place]) 10/08/2013  . 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 TOBACCO ABUSE 03/23/2007    Past Surgical History:  Procedure Laterality  Date  . BIOPSY  11/25/2017   Procedure: BIOPSY;  Surgeon: Arta Silence, MD;  Location: [facility] ENDOSCOPY;  Service: Endoscopy;;  . BIOPSY  10/14/2018   Procedure: BIOPSY;  Surgeon: Arta Silence, MD;  Location: [facility];  Service: Endoscopy;;  . CARDIAC CATHETERIZATION    . ESOPHAGOGASTRODUODENOSCOPY (EGD) WITH PROPOFOL N/A 11/25/2017   Procedure: ESOPHAGOGASTRODUODENOSCOPY (EGD) WITH PROPOFOL;  Surgeon: Arta Silence, MD;  Location: [facility];  Service: Endoscopy;  Laterality: N/A;  . ESOPHAGOGASTRODUODENOSCOPY (EGD) WITH PROPOFOL Left 10/14/2018   Procedure: ESOPHAGOGASTRODUODENOSCOPY (EGD) WITH PROPOFOL;  Surgeon: Arta Silence, MD;  Location[facility] ENDOSCOPY;  Service: Endoscopy;  Laterality: Left;  . ESOPHAGOGASTRODUODENOSCOPY (EGD) WITH PROPOFOL N/A 11/14/2018   Procedure: ESOPHAGOGASTRODUODENOSCOPY (EGD) WITH PROPOFOL;  Surgeon: Laurence Spates, MD;  Location: [facility] ENDOSCOPY;  Service: Gastroenterology;  Laterality: N/A;  . EYE SURGERY Left 1990's   "result of trauma"   . FACIAL FRACTURE SURGERY Left 1990's   "result of trauma"   . FRACTURE SURGERY    . HERNIA REPAIR    . LEFT HEART CATHETERIZATION WITH CORONARY ANGIOGRAM Right 03/07/2013   Procedure: LEFT HEART CATHETERIZATION WITH CORONARY ANGIOGRAM;  Surgeon: [name], MD;  Location: [facility] CATH LAB;  Service: Cardiovascular;  Laterality: Right;  . UMBILICAL HERNIA REPAIR         Family History  Problem Relation Age of Onset  . Hypertension Mother   . Cirrhosis Mother   . Alcoholism Mother   . Hypertension Father   . Melanoma Father   . Hypertension Other   . Coronary artery disease Other     Social History   Tobacco Use  . Smoking status: Current Every Day Smoker    Packs/day: 1.00    Years: 33.00    Pack years: 33.00    Types: Cigarettes, E-cigarettes  . Smokeless tobacco: Never Used  Substance Use Topics  . Alcohol use: Yes  . Drug use: Yes    Types: Marijuana, Cocaine    Comment: daily marijuana use; last  cocaine use about 3 months ago    Home Medications Prior to Admission medications   Medication Sig Start Date End Date Taking? Authorizing Provider  acetaminophen (TYLENOL) 500 MG tablet Take 2 tablets (1,000 mg total) by mouth every 8 (eight) hours as needed for mild pain. 08/29/19   Elodia Florence., MD  chlordiazePOXIDE (LIBRIUM) 10 MG capsule Take 1 capsule (10 mg total) by mouth 3 (three) times daily as needed for anxiety or withdrawal. 09/06/19   Fulp, Cammie, MD  Cyanocobalamin (VITAMIN B-12 PO) Take 1 tablet by mouth daily.    [provider]  cyclobenzaprine (FLEXERIL) 10 MG tablet Take 1 tablet (10 mg total) by mouth 2 (two) times daily as needed for muscle spasms. 03/03/19   Recardo Evangelist, PA-C  famotidine (PEPCID) 20 MG tablet Take 1 tablet (20 mg total) by mouth 2 (two) times daily. 08/08/19   Carlisle Cater, PA-C  folic acid (FOLVITE) 1 MG tablet Take 1 tablet (1 mg total)  by mouth daily. 11/26/17   [name], MD  hydrALAZINE (APRESOLINE) 25 MG tablet Take 1 tablet (25 mg total) by mouth 3 (three) times daily. 09/09/19   Fulp, Cammie, MD  hydrOXYzine (VISTARIL) 25 MG capsule Take 25 mg by mouth 3 (three) times daily as needed for anxiety.  06/19/19   [provider]  lipase/protease/amylase (CREON) 12000 units CPEP capsule Take 3 capsules (36,000 Units total) by mouth 3 (three) times daily with meals. For pancreatitis 09/06/19   Fulp, Cammie, MD  loratadine (CLARITIN) 10 MG tablet Take 1 tablet (10 mg total) by mouth daily as needed for allergies or rhinitis. (May purchase from over the counter): For allergies Patient taking differently: Take 10 mg by mouth daily. (May purchase from over the counter): For allergies 10/01/17   Aline August, MD  LORazepam (ATIVAN) 0.5 MG tablet Take 0.5 mg by mouth every 8 (eight) hours as needed for anxiety.    [provider]  metoprolol tartrate (LOPRESSOR) 100 MG tablet Take 1 tablet (100 mg total) by mouth 2  (two) times daily. To lower blood pressure 09/09/19   Fulp, Cammie, MD  Multiple Vitamin (MULTIVITAMIN WITH MINERALS) TABS tablet Take 1 tablet by mouth daily. 09/06/17   Bonnell Public, MD  NASONEX 50 MCG/ACT nasal spray Place 2 sprays into the nose daily. 06/19/19   [provider]  ondansetron (ZOFRAN) 4 MG tablet Take 1 tablet (4 mg total) by mouth every 8 (eight) hours as needed for nausea or vomiting. 11/24/18   Nuala Alpha A, PA-C  pantoprazole (PROTONIX) 40 MG tablet Take 1 tablet (40 mg total) by mouth 2 (two) times daily. 09/06/19 10/06/19  Fulp, Cammie, MD  potassium chloride SA (K-DUR) 20 MEQ tablet Take 1 tablet (20 mEq total) by mouth daily. Patient taking differently: Take 20 mEq by mouth daily as needed (potassium).  01/07/19   Sherwood Gambler, MD  QUEtiapine (SEROQUEL) 100 MG tablet Take 100 mg by mouth at bedtime.    [provider]  sertraline (ZOLOFT) 100 MG tablet Take 1 tablet (100 mg total) by mouth daily. Patient taking differently: Take 150 mg by mouth daily.  08/29/17   Clent Demark, PA-C  sucralfate (CARAFATE) 1 g tablet Take 1 tablet (1 g total) by mouth 4 (four) times daily -  with meals and at bedtime. 09/06/19   Fulp, Cammie, MD  thiamine 100 MG tablet Take 1 tablet (100 mg total) by mouth daily. 10/16/18   [name], MD  amitriptyline (ELAVIL) 25 MG tablet Take 1 tablet (25 mg total) by mouth at bedtime. Patient not taking: Reported on 08/08/2019 10/15/18 08/08/19  [name], MD  gabapentin (NEURONTIN) 100 MG capsule Take 1 capsule (100 mg total) by mouth 2 (two) times daily. Patient not taking: Reported on 08/08/2019 10/15/18 08/08/19  [name], MD  promethazine (PHENERGAN) 25 MG tablet Take 1 tablet (25 mg total) by mouth every 6 (six) hours as needed for nausea or vomiting. Patient not taking: Reported on 08/08/2019 03/02/19 08/08/19  Kinnie Feil, PA-C    Allergies    Robaxin [methocarbamol], Shellfish-derived products, Trazodone,  Trazodone and nefazodone, Adhesive [tape], Latex, Toradol [ketorolac tromethamine], Contrast media [iodinated diagnostic agents], and Reglan [metoclopramide]  Review of Systems   Review of Systems  Constitutional: Negative for chills and fever.  HENT: Negative.   Respiratory: Positive for shortness of breath.   Cardiovascular: Positive for chest pain. Negative for palpitations and leg swelling.  Gastrointestinal: Positive  for abdominal pain and nausea. Negative for blood in stool, constipation, diarrhea and vomiting.  Genitourinary: Negative for dysuria.  Musculoskeletal: Negative for arthralgias and myalgias.  Skin: Negative for color change and rash.  Neurological: Positive for light-headedness. Negative for headaches.  All other systems reviewed and are negative.   Physical Exam Updated Vital Signs BP (!) 140/99 (BP Location: Right Arm)   Pulse 78   Temp 98.8 F (37.1 C) (Oral)   Resp 17   SpO2 100%   Physical Exam Vitals and nursing note reviewed.  Constitutional:      General: He is not in acute distress.    Appearance: He is well-developed. He is not diaphoretic.     Comments: Prior to entering room patient is sitting comfortably and watching TV, upon entering room pt begins clutching his chest and moaning in pain  HENT:     Head: Normocephalic and atraumatic.  Eyes:     General:        Right eye: No discharge.        Left eye: No discharge.  Cardiovascular:     Rate and Rhythm: Normal rate and regular rhythm.     Pulses:          Radial pulses are 2+ on the right side and 2+ on the left side.       Dorsalis pedis pulses are 2+ on the right side and 2+ on the left side.     Heart sounds: Normal heart sounds. No murmur heard.  No gallop.   Pulmonary:     Effort: Pulmonary effort is normal. No respiratory distress.     Breath sounds: Normal breath sounds. No wheezing or rales.     Comments: Respirations equal and unlabored, patient able to speak in full sentences,  lungs clear to auscultation bilaterally Chest:     Chest wall: Tenderness present.  Abdominal:     General: Bowel sounds are normal. There is no distension.     Palpations: Abdomen is soft. There is no mass.     Tenderness: There is abdominal tenderness. There is no guarding.     Comments: Abdomen soft, nondistended, bowel sounds present throughout, epigastric tenderness with voluntary guarding noted, improves with distraction, other quadrants NTTP  Musculoskeletal:        General: No deformity.     Cervical back: Neck supple.     Right lower leg: No edema.     Left lower leg: No edema.  Skin:    General: Skin is warm and dry.     Capillary Refill: Capillary refill takes less than 2 seconds.  Neurological:     Mental Status: He is alert.     Coordination: Coordination normal.     Comments: Speech is clear, able to follow commands Moves extremities without ataxia, coordination intact  Psychiatric:        Mood and Affect: Mood normal.        Behavior: Behavior normal.     ED Results / Procedures / Treatments   Labs (all labs ordered are listed, but only abnormal results are displayed) Labs Reviewed  COMPREHENSIVE METABOLIC PANEL - Abnormal; Notable for the following components:      Result Value   CO2 20 (*)    Glucose, Bld 103 (*)    Calcium 8.2 (*)    Total Protein 6.2 (*)    Albumin 3.2 (*)    All other components within normal limits  CBC WITH DIFFERENTIAL/PLATELET - Abnormal; Notable for  the following components:   RBC 3.12 (*)    Hemoglobin 9.2 (*)    HCT 29.1 (*)    RDW 16.3 (*)    All other components within normal limits  LIPASE, BLOOD  TROPONIN I (HIGH SENSITIVITY)  TROPONIN I (HIGH SENSITIVITY)    EKG None  Radiology DG Chest Port 1 View  Result Date: 09/19/2019 CLINICAL DATA:  Chest pain. EXAM: PORTABLE CHEST 1 VIEW COMPARISON:  PA and lateral chest 08/28/2019 and 12/25/2018. FINDINGS: The lungs are clear. Heart size is normal. No pneumothorax or  pleural fluid. No acute or focal bony abnormality. IMPRESSION: Negative chest. Electronically Signed   By: Inge Rise M.D.   On: 09/19/2019 12:17    Procedures Procedures (including critical care time)  Medications Ordered in ED Medications  pantoprazole (PROTONIX) injection 40 mg (40 mg Intravenous Given 09/19/19 1233)  alum & mag hydroxide-simeth (MAALOX/MYLANTA) 200-200-20 MG/5ML suspension 30 mL (30 mLs Oral Given 09/19/19 1233)    And  lidocaine (XYLOCAINE) 2 % viscous mouth solution 15 mL (15 mLs Oral Given 09/19/19 1233)  sucralfate (CARAFATE) tablet 1 g (1 g Oral Given 09/19/19 1526)    ED Course  I have reviewed the triage vital signs and the nursing notes.  Pertinent labs & imaging results that were available during my care of the patient were reviewed by me and considered in my medical decision making (see chart for details).    MDM Rules/Calculators/A&P                      This patient presents to the ED for complaints of chest/abdominal pain, patient states abdominal pain feels like his usual chronic pain, chest pain started about an hour prior to arrival. Patient is nontoxic, resting comfortably when viewed through the window from the hallway but upon entering the room patient immediately begins to clutch his chest and abdomen. Vitals on my assessment are WNL, tachycardia from earlier vitals has normalized. Does not appear to have signs of withdrawal, states last drink was over a week ago. Exam with reproducibility of pain with chest wall & epigastric/LUQ abdominal palpation, no peritoneal signs. DDX: ACS, PE, GERD, PUD, gastritis, acute/chronic pancreatitis, cholecystitis, cholelithiasis, chronic pain, dissection, PTX.     Additional history obtained:  Chart reviewed for additional history:  Recent hospital admission 04/17-04/22: Patient was admitted for chest and abdominal pain which was thought to be secondary to gastritis and chronic pancreatitis.  He had gradual  improvement.  VQ scan was negative for PE.  Troponins were negative.   Patient has care plan in place which was also reviewed.    EKG: Sinus rhythm with short PR, Left atrial enlargement, no STEMI or ischemic changes Lab Tests:  Reviewed and interpreted labs, which included:  CBC: Anemia similar to prior, no leukocytosis CMP: CO2 of 20, glucose of 103, calcium of 8.2, normal renal and liver function Lipase: WNL Troponin: No significant elevation, no change in delta Imaging: CXR: No active cardiopulmonary disease.   Medicines ordered:  I ordered  GI cocktail, Protonix, Carafate for pain.   Patient repeatedly asking for narcotic pain medication, he has been prescribed and has with him oxycodone from his primary care doctor and has been referred to pain management.  Reports his chest pain has resolved, requesting pain medication for his chronic abdominal pain.   Heart score 4, troponin without significant elevation, pain is nonexertional, recent admission with negative trending troponins with similar pain, doubt ACS.  Patient is  low risk [place], VQ scan during recent admission was negative, doubt pulmonary embolism.  His labs are overall reassuring, no critical anemia or significant electrolyte derangement.  Lipase and LFTs without acute elevation.  Abdominal exam without peritoneal signs, do not suspect acute surgical abdomen.  His lungs are clear without decreased breath sounds, do not suspect pneumothorax.  No respiratory symptoms to raise concern for pneumonia.  Symmetric pulses, pain is not a tearing sensation, doubt dissection.  Patient does not appear to be clinically withdrawing from alcohol at this time.  He has a history of chronic pain.  He was given a GI cocktail in the emergency department.  Requesting narcotics which do not appear appropriate at this time.  Will discharge home with instructions to follow-up with PCP and/or gastroenterology.  He is currently on PPI, H2 blocker, and Carafate  which I recommended he continue.    I went in to discuss results and plan with patient, but he had already left the department without notifying any staff prior to receiving results.  Portions of this note were generated with Lobbyist. Dictation errors may occur despite best attempts at proofreading.    Final Clinical Impression(s) / ED Diagnoses Final diagnoses:  Left-sided chest pain  Epigastric pain    Rx / DC Orders ED Discharge Orders    None           Janet Berlin 09/19/19 1609    Pattricia Boss, MD 09/21/19 1104    Jacqlyn Larsen, PA-C 10/29/19 1149    Pattricia Boss, MD 10/29/19 1521

## 2019-09-19 NOTE — Telephone Encounter (Signed)
Spoke with patient and informed him again that provider discussed with him during his last visit that he would not be getting the oxycodone and pain clinic will be picking it up. Patient verbalized understanding.   Patient stated that the pain clinic this office referred him to he tried to schedule an appt with them and they denied him and now he need another referral placed in.

## 2019-09-19 NOTE — ED Notes (Signed)
Patient did not wait for DC instructions and eloped before he was up for discharge. Ambulatory

## 2019-09-19 NOTE — Telephone Encounter (Signed)
Patient stated the pain clinic denied him and he just left the hospital and needs your referral to another pain clinic. In addition he is requesting a prescription for oxi 7.5 until his appointment on 10-04-19 with dr fulp. Patient states you know his condition and feels discriminated against because he has never abused drugs and seriously needs his medicine because he is in severe pain. Alternate lines of communication is [phone].

## 2019-09-19 NOTE — Discharge Instructions (Addendum)
Your work-up today has been reassuring it does not suggest an acute problem with your heart or abdomen.  Pain is likely related to your chronic abdominal pain.  Please schedule follow-up appointment with your primary care doctor, in the meantime continue taking all your regular medications, you can also use over-the-counter Maalox for breakthrough pain.

## 2019-09-20 ENCOUNTER — Institutional Professional Consult (permissible substitution): Payer: Self-pay | Admitting: Pulmonary Disease

## 2019-09-20 NOTE — Telephone Encounter (Signed)
Dr Chapman Fitch is aware that  . I will mailed him  a list of Pain Clinic since he is uninsured

## 2019-09-27 ENCOUNTER — Emergency Department (HOSPITAL_COMMUNITY)
Admission: EM | Admit: 2019-09-27 | Discharge: 2019-09-28 | Disposition: A | Discharge status: Home / Self Care | Payer: Medicaid Other | Attending: Emergency Medicine | Admitting: Emergency Medicine

## 2019-09-27 ENCOUNTER — Emergency Department (HOSPITAL_COMMUNITY): Payer: Medicaid Other

## 2019-09-27 DIAGNOSIS — R079 Chest pain, unspecified: Secondary | ICD-10-CM | POA: Diagnosis present

## 2019-09-27 DIAGNOSIS — Z91040 Latex allergy status: Secondary | ICD-10-CM | POA: Insufficient documentation

## 2019-09-27 DIAGNOSIS — F17210 Nicotine dependence, cigarettes, uncomplicated: Secondary | ICD-10-CM | POA: Insufficient documentation

## 2019-09-27 DIAGNOSIS — Z79899 Other long term (current) drug therapy: Secondary | ICD-10-CM | POA: Insufficient documentation

## 2019-09-27 DIAGNOSIS — I10 Essential (primary) hypertension: Secondary | ICD-10-CM | POA: Diagnosis not present

## 2019-09-27 DIAGNOSIS — R1013 Epigastric pain: Secondary | ICD-10-CM | POA: Diagnosis not present

## 2019-09-27 DIAGNOSIS — J45909 Unspecified asthma, uncomplicated: Secondary | ICD-10-CM | POA: Diagnosis not present

## 2019-09-27 DIAGNOSIS — R110 Nausea: Secondary | ICD-10-CM | POA: Insufficient documentation

## 2019-09-27 LAB — BASIC METABOLIC PANEL
Anion gap: 14 (ref 5–15)
BUN: 12 mg/dL (ref 6–20)
CO2: 17 mmol/L — ABNORMAL LOW (ref 22–32)
Calcium: 8.9 mg/dL (ref 8.9–10.3)
Chloride: 102 mmol/L (ref 98–111)
Creatinine, Ser: 0.74 mg/dL (ref 0.61–1.24)
GFR calc Af Amer: >60 mL/min (ref >60)
GFR calc non Af Amer: >60 mL/min (ref >60)
Glucose, Bld: 85 mg/dL (ref 70–99)
Potassium: 3.8 mmol/L (ref 3.5–5.1)
Sodium: 133 mmol/L — ABNORMAL LOW (ref 135–145)

## 2019-09-27 LAB — CBC
HCT: 33.8 % — ABNORMAL LOW (ref 39.0–52.0)
Hemoglobin: 11.1 g/dL — ABNORMAL LOW (ref 13.0–17.0)
MCH: 29.8 pg (ref 26.0–34.0)
MCHC: 32.8 g/dL (ref 30.0–36.0)
MCV: 90.9 fL (ref 80.0–100.0)
Platelets: 146 10*3/uL — ABNORMAL LOW (ref 150–400)
RBC: 3.72 MIL/uL — ABNORMAL LOW (ref 4.22–5.81)
RDW: 16.7 % — ABNORMAL HIGH (ref 11.5–15.5)
WBC: 8.3 10*3/uL (ref 4.0–10.5)
nRBC: 0 % (ref 0.0–0.2)

## 2019-09-27 LAB — TROPONIN I (HIGH SENSITIVITY)
Troponin I (High Sensitivity): 3 ng/L (ref <18)
Troponin I (High Sensitivity): 12 ng/L (ref <18)

## 2019-09-27 MED ORDER — SODIUM CHLORIDE 0.9% FLUSH: 3.0000 mL | Freq: Once | INTRAVENOUS | Status: DC

## 2019-09-27 NOTE — ED Triage Notes (Signed)
BIB EMS from home. Reports CP onset PTA. Seen here frequently for same, requesting narcotics in triage.

## 2019-09-28 ENCOUNTER — Other Ambulatory Visit: Payer: Self-pay

## 2019-09-28 ENCOUNTER — Emergency Department (HOSPITAL_COMMUNITY)
Admission: EM | Admit: 2019-09-28 | Discharge: 2019-09-28 | Disposition: A | Discharge status: Home / Self Care | Payer: Medicaid Other | Source: Home / Self Care | Attending: Emergency Medicine | Admitting: Emergency Medicine

## 2019-09-28 ENCOUNTER — Emergency Department (HOSPITAL_COMMUNITY): Payer: Medicaid Other

## 2019-09-28 ENCOUNTER — Encounter (HOSPITAL_COMMUNITY): Payer: Self-pay

## 2019-09-28 DIAGNOSIS — I10 Essential (primary) hypertension: Secondary | ICD-10-CM | POA: Insufficient documentation

## 2019-09-28 DIAGNOSIS — F17210 Nicotine dependence, cigarettes, uncomplicated: Secondary | ICD-10-CM | POA: Insufficient documentation

## 2019-09-28 DIAGNOSIS — R1013 Epigastric pain: Secondary | ICD-10-CM | POA: Insufficient documentation

## 2019-09-28 DIAGNOSIS — R0789 Other chest pain: Secondary | ICD-10-CM

## 2019-09-28 DIAGNOSIS — Z79899 Other long term (current) drug therapy: Secondary | ICD-10-CM | POA: Insufficient documentation

## 2019-09-28 DIAGNOSIS — Z91040 Latex allergy status: Secondary | ICD-10-CM | POA: Insufficient documentation

## 2019-09-28 DIAGNOSIS — R110 Nausea: Secondary | ICD-10-CM | POA: Insufficient documentation

## 2019-09-28 DIAGNOSIS — J45909 Unspecified asthma, uncomplicated: Secondary | ICD-10-CM | POA: Insufficient documentation

## 2019-09-28 LAB — BASIC METABOLIC PANEL
Anion gap: 17 — ABNORMAL HIGH (ref 5–15)
Anion gap: 11 (ref 5–15)
BUN: 17 mg/dL (ref 6–20)
BUN: 16 mg/dL (ref 6–20)
CO2: 12 mmol/L — ABNORMAL LOW (ref 22–32)
CO2: 16 mmol/L — ABNORMAL LOW (ref 22–32)
Calcium: 10.3 mg/dL (ref 8.9–10.3)
Calcium: 9.5 mg/dL (ref 8.9–10.3)
Chloride: 101 mmol/L (ref 98–111)
Chloride: 105 mmol/L (ref 98–111)
Creatinine, Ser: 1.78 mg/dL — ABNORMAL HIGH (ref 0.61–1.24)
Creatinine, Ser: 1.32 mg/dL — ABNORMAL HIGH (ref 0.61–1.24)
GFR calc Af Amer: 50 mL/min — ABNORMAL LOW (ref >60)
GFR calc Af Amer: >60 mL/min (ref >60)
GFR calc non Af Amer: 44 mL/min — ABNORMAL LOW (ref >60)
GFR calc non Af Amer: >60 mL/min (ref >60)
Glucose, Bld: 117 mg/dL — ABNORMAL HIGH (ref 70–99)
Glucose, Bld: 113 mg/dL — ABNORMAL HIGH (ref 70–99)
Potassium: 5.2 mmol/L — ABNORMAL HIGH (ref 3.5–5.1)
Potassium: 5 mmol/L (ref 3.5–5.1)
Sodium: 130 mmol/L — ABNORMAL LOW (ref 135–145)
Sodium: 132 mmol/L — ABNORMAL LOW (ref 135–145)

## 2019-09-28 LAB — HEPATIC FUNCTION PANEL
ALT: 50 U/L — ABNORMAL HIGH (ref 0–44)
ALT: 44 U/L (ref 0–44)
AST: 100 U/L — ABNORMAL HIGH (ref 15–41)
AST: 64 U/L — ABNORMAL HIGH (ref 15–41)
Albumin: 4.7 g/dL (ref 3.5–5.0)
Albumin: 5.5 g/dL — ABNORMAL HIGH (ref 3.5–5.0)
Alkaline Phosphatase: 88 U/L (ref 38–126)
Alkaline Phosphatase: 104 U/L (ref 38–126)
Bilirubin, Direct: 0.7 mg/dL — ABNORMAL HIGH (ref 0.0–0.2)
Bilirubin, Direct: 0.3 mg/dL — ABNORMAL HIGH (ref 0.0–0.2)
Indirect Bilirubin: 0.3 mg/dL (ref 0.3–0.9)
Indirect Bilirubin: 0.8 mg/dL (ref 0.3–0.9)
Total Bilirubin: 1 mg/dL (ref 0.3–1.2)
Total Bilirubin: 1.1 mg/dL (ref 0.3–1.2)
Total Protein: 8.8 g/dL — ABNORMAL HIGH (ref 6.5–8.1)
Total Protein: 10.2 g/dL — ABNORMAL HIGH (ref 6.5–8.1)

## 2019-09-28 LAB — TROPONIN I (HIGH SENSITIVITY)
Troponin I (High Sensitivity): 5 ng/L (ref <18)
Troponin I (High Sensitivity): 7 ng/L (ref <18)
Troponin I (High Sensitivity): 6 ng/L (ref <18)

## 2019-09-28 LAB — LIPASE, BLOOD
Lipase: 18 U/L (ref 11–51)
Lipase: 20 U/L (ref 11–51)

## 2019-09-28 LAB — CBC
HCT: 43.6 % (ref 39.0–52.0)
Hemoglobin: 13.2 g/dL (ref 13.0–17.0)
MCH: 28.9 pg (ref 26.0–34.0)
MCHC: 30.3 g/dL (ref 30.0–36.0)
MCV: 95.6 fL (ref 80.0–100.0)
Platelets: 177 10*3/uL (ref 150–400)
RBC: 4.56 MIL/uL (ref 4.22–5.81)
RDW: 17.2 % — ABNORMAL HIGH (ref 11.5–15.5)
WBC: 11.8 10*3/uL — ABNORMAL HIGH (ref 4.0–10.5)
nRBC: 0 % (ref 0.0–0.2)

## 2019-09-28 LAB — I-STAT CHEM 8, ED
BUN: 15 mg/dL (ref 6–20)
Calcium, Ion: 1.21 mmol/L (ref 1.15–1.40)
Chloride: 110 mmol/L (ref 98–111)
Creatinine, Ser: 1.1 mg/dL (ref 0.61–1.24)
Glucose, Bld: 88 mg/dL (ref 70–99)
HCT: 43 % (ref 39.0–52.0)
Hemoglobin: 14.6 g/dL (ref 13.0–17.0)
Potassium: 4.3 mmol/L (ref 3.5–5.1)
Sodium: 135 mmol/L (ref 135–145)
TCO2: 16 mmol/L — ABNORMAL LOW (ref 22–32)

## 2019-09-28 LAB — ETHANOL: Alcohol, Ethyl (B): <10 mg/dL (ref <10)

## 2019-09-28 MED ORDER — SODIUM CHLORIDE 0.9 % IV BOLUS
1000.0000 mL | Freq: Once | INTRAVENOUS | Status: AC
  Administered 2019-09-28: 1000 mL via INTRAVENOUS

## 2019-09-28 MED ORDER — ALUM & MAG HYDROXIDE-SIMETH 200-200-20 MG/5ML PO SUSP
30.0000 mL | Freq: Once | ORAL | Status: AC
  Administered 2019-09-28: 30 mL via ORAL
  Filled 2019-09-28: qty 30

## 2019-09-28 MED ORDER — MORPHINE SULFATE (PF) 4 MG/ML IV SOLN
4.0000 mg | Freq: Once | INTRAVENOUS | Status: AC
  Administered 2019-09-28: 4 mg via INTRAVENOUS
  Filled 2019-09-28: qty 1

## 2019-09-28 MED ORDER — LIDOCAINE VISCOUS HCL 2 % MT SOLN
15.0000 mL | Freq: Once | OROMUCOSAL | Status: AC
  Administered 2019-09-28: 15 mL via ORAL
  Filled 2019-09-28: qty 15

## 2019-09-28 MED ORDER — SUCRALFATE 1 GM/10ML PO SUSP
1.0000 g | Freq: Once | ORAL | Status: AC
  Administered 2019-09-28: 1 g via ORAL
  Filled 2019-09-28: qty 10

## 2019-09-28 MED ORDER — DROPERIDOL 2.5 MG/ML IJ SOLN
2.5000 mg | Freq: Once | INTRAMUSCULAR | Status: AC
  Administered 2019-09-28: 2.5 mg via INTRAVENOUS
  Filled 2019-09-28: qty 2

## 2019-09-28 NOTE — ED Triage Notes (Signed)
BIB EMS from home. Reports CP onset 0430. Was just seen/DC for same. 12 lead shows peaked T waves, pt endorses not having had potassium "in weeks." NAD noted on arrival, VSS. EMS did not give ASA due to ulcers, no NTG due to inability to get IV access.

## 2019-09-28 NOTE — ED Triage Notes (Signed)
Pt reports he found a tick on him while at ED and when he was home he saw a rash.

## 2019-09-28 NOTE — ED Notes (Signed)
Pt asked this nurse to examine L side of neck for a "bite". Brown tick was noted on pt skin. This nurse removed tick with tweezers and head was intact upon removal

## 2019-09-28 NOTE — ED Triage Notes (Signed)
Pt reports the CP never stopped. Pt reported he hurt worse.

## 2019-09-28 NOTE — ED Notes (Signed)
Pt provided fluids for PO challenge

## 2019-09-28 NOTE — ED Provider Notes (Signed)
[facility] EMERGENCY DEPARTMENT Provider Note   CSN: [id number] Arrival date & time: 09/27/19  1840     History Chief Complaint  Patient presents with  . Chest Pain    [name] is a 49 y.o. male.  HPI     This a 49 year old male with a history of chronic pancreatitis, alcohol abuse, cocaine, gastric ulcers, hypertension who presents with chest pain.  Patient reports chest pain starting this past afternoon.  He has been seen and evaluated multiple times in the past for similar symptoms.  Reports that he sometimes gets this way when "my stomach acts up."  He reports several episodes of "passing blood in my stool."  He reports he has upper abdominal and chest discomfort that started after that.  He has had nausea without vomiting.  No hematemesis.  Rates his pain at 8 out of 10.  He takes Carafate and Creon at home.  He does report that he tried to abstain from alcohol but got the shakes and drank some alcohol yesterday morning.  Denies shortness of breath or fevers.  Past Medical History:  Diagnosis Date  . Alcoholism /alcohol abuse ([place])   . Anemia   . Anxiety   . Arthritis    "knees; arms; elbows" (03/26/2015)  . Asthma   . Bipolar disorder ([place])   . Chronic bronchitis ([place])   . Chronic lower back pain   . Chronic pancreatitis ([place])   . Cocaine abuse ([place])   . Depression   . Family history of adverse reaction to anesthesia    "grandmother gets confused"  . Femoral condyle fracture ([place]) 03/08/2014   left medial/notes 03/09/2014  . GERD (gastroesophageal reflux disease)   . H/O hiatal hernia   . H/O suicide attempt 10/2012  . High cholesterol   . History of blood transfusion 10/2012   "when I tried to commit suicide"  . History of stomach ulcers   . Hypertension   . Marijuana abuse, continuous   . Migraine    "a few times/year" (03/26/2015)  . Pneumonia 1990's X 3  . PTSD (post-traumatic stress disorder)   . Sickle cell trait ([place])     . WPW (Wolff-Parkinson-White syndrome)    [name] 03/06/2013    Patient Active Problem List   Diagnosis Date Noted  . AKI (acute kidney injury) ([place]) 11/13/2018  . Seizure ([place]) 11/13/2018  . Gastritis and gastroduodenitis   . Chest pain 01/08/2018  . GI bleed 11/24/2017  . Acute blood loss anemia 11/24/2017  . Atypical chest pain 11/24/2017  . Acute pancreatitis 09/28/2017  . Abdominal pain 05/27/2017  . Hematemesis 05/27/2017  . Tachycardia 03/18/2017  . Diarrhea 03/18/2017  . Acute on chronic pancreatitis ([place]) 12/17/2016  . Intractable nausea and vomiting 12/05/2016  . Verbally abusive behavior 12/05/2016  . Normocytic anemia 12/05/2016  . Alcohol use disorder, severe, dependence ([place]) 07/25/2016  . Cocaine use disorder, severe, dependence ([place]) 07/25/2016  . 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 Leukocytosis   . Hospital acquired PNA 05/20/2015  . Chronic pancreatitis ([place]) 05/18/2015  . Pseudocyst of pancreas 05/18/2015  . Polysubstance abuse (tobacco, cocaine, THC, and ETOH) 03/26/2015  . Alcohol-induced chronic pancreatitis ([place])   . Essential hypertension 02/06/2014  . Mood disorder ([place]) 02/06/2014  . Alcohol-induced acute pancreatitis 11/28/2013  . Pancreatic pseudocyst/cyst 11/25/2013  . Severe protein-calorie malnutrition ([place]) 10/10/2013  . Suicide attempt ([place]) 10/08/2013  . [name] Parkinson White pattern seen on electrocardiogram  10/03/2012  . TOBACCO ABUSE 03/23/2007    Past Surgical History:  Procedure Laterality Date  . BIOPSY  11/25/2017   Procedure: BIOPSY;  Surgeon: Arta Silence, MD;  Location: [facility] ENDOSCOPY;  Service: Endoscopy;;  . BIOPSY  10/14/2018   Procedure: BIOPSY;  Surgeon: Arta Silence, MD;  Location: [facility];  Service: Endoscopy;;  . CARDIAC CATHETERIZATION    . ESOPHAGOGASTRODUODENOSCOPY (EGD) WITH PROPOFOL N/A 11/25/2017   Procedure: ESOPHAGOGASTRODUODENOSCOPY (EGD) WITH  PROPOFOL;  Surgeon: Arta Silence, MD;  Location: [facility];  Service: Endoscopy;  Laterality: N/A;  . ESOPHAGOGASTRODUODENOSCOPY (EGD) WITH PROPOFOL Left 10/14/2018   Procedure: ESOPHAGOGASTRODUODENOSCOPY (EGD) WITH PROPOFOL;  Surgeon: Arta Silence, MD;  Location[facility] ENDOSCOPY;  Service: Endoscopy;  Laterality: Left;  . ESOPHAGOGASTRODUODENOSCOPY (EGD) WITH PROPOFOL N/A 11/14/2018   Procedure: ESOPHAGOGASTRODUODENOSCOPY (EGD) WITH PROPOFOL;  Surgeon: Laurence Spates, MD;  Location: [facility] ENDOSCOPY;  Service: Gastroenterology;  Laterality: N/A;  . EYE SURGERY Left 1990's   "result of trauma"   . FACIAL FRACTURE SURGERY Left 1990's   "result of trauma"   . FRACTURE SURGERY    . HERNIA REPAIR    . LEFT HEART CATHETERIZATION WITH CORONARY ANGIOGRAM Right 03/07/2013   Procedure: LEFT HEART CATHETERIZATION WITH CORONARY ANGIOGRAM;  Surgeon: [name], MD;  Location: [facility] CATH LAB;  Service: Cardiovascular;  Laterality: Right;  . UMBILICAL HERNIA REPAIR         Family History  Problem Relation Age of Onset  . Hypertension Mother   . Cirrhosis Mother   . Alcoholism Mother   . Hypertension Father   . Melanoma Father   . Hypertension Other   . Coronary artery disease Other     Social History   Tobacco Use  . Smoking status: Current Every Day Smoker    Packs/day: 1.00    Years: 33.00    Pack years: 33.00    Types: Cigarettes, E-cigarettes  . Smokeless tobacco: Never Used  Substance Use Topics  . Alcohol use: Yes  . Drug use: Yes    Types: Marijuana, Cocaine    Comment: daily marijuana use; last cocaine use about 3 months ago    Home Medications Prior to Admission medications   Medication Sig Start Date End Date Taking? Authorizing Provider  acetaminophen (TYLENOL) 500 MG tablet Take 2 tablets (1,000 mg total) by mouth every 8 (eight) hours as needed for mild pain. 08/29/19  Yes Elodia [place]., MD  chlordiazePOXIDE (LIBRIUM) 10 MG capsule Take 1 capsule (10 mg total)  by mouth 3 (three) times daily as needed for anxiety or withdrawal. 09/06/19  Yes Fulp, Cammie, MD  Cyanocobalamin (VITAMIN B-12 PO) Take 1 tablet by mouth daily.   Yes [provider]  cyclobenzaprine (FLEXERIL) 10 MG tablet Take 1 tablet (10 mg total) by mouth 2 (two) times daily as needed for muscle spasms. 03/03/19  Yes Recardo Evangelist, PA-C  famotidine (PEPCID) 20 MG tablet Take 1 tablet (20 mg total) by mouth 2 (two) times daily. 08/08/19  Yes Carlisle Cater, PA-C  folic acid (FOLVITE) 1 MG tablet Take 1 tablet (1 mg total) by mouth daily. 11/26/17  Yes [name], MD  hydrALAZINE (APRESOLINE) 25 MG tablet Take 1 tablet (25 mg total) by mouth 3 (three) times daily. 09/09/19  Yes Fulp, Cammie, MD  hydrOXYzine (VISTARIL) 25 MG capsule Take 25 mg by mouth 3 (three) times daily as needed for anxiety.  06/19/19  Yes [provider]  lipase/protease/amylase (CREON) 12000 units CPEP capsule Take 3  capsules (36,000 Units total) by mouth 3 (three) times daily with meals. For pancreatitis 09/06/19  Yes Fulp, Cammie, MD  loratadine (CLARITIN) 10 MG tablet Take 1 tablet (10 mg total) by mouth daily as needed for allergies or rhinitis. (May purchase from over the counter): For allergies Patient taking differently: Take 10 mg by mouth daily. (May purchase from over the counter): For allergies 10/01/17  Yes Aline August, MD  LORazepam (ATIVAN) 0.5 MG tablet Take 0.5 mg by mouth every 8 (eight) hours as needed for anxiety.   Yes [provider]  metoprolol tartrate (LOPRESSOR) 100 MG tablet Take 1 tablet (100 mg total) by mouth 2 (two) times daily. To lower blood pressure 09/09/19  Yes Fulp, Cammie, MD  Multiple Vitamin (MULTIVITAMIN WITH MINERALS) TABS tablet Take 1 tablet by mouth daily. 09/06/17  Yes Bonnell Public, MD  NASONEX 50 MCG/ACT nasal spray Place 2 sprays into the nose daily. 06/19/19  Yes [provider]  ondansetron (ZOFRAN) 4 MG tablet Take 1 tablet (4 mg  total) by mouth every 8 (eight) hours as needed for nausea or vomiting. 11/24/18  Yes Nuala Alpha A, PA-C  pantoprazole (PROTONIX) 40 MG tablet Take 1 tablet (40 mg total) by mouth 2 (two) times daily. 09/06/19 10/06/19 Yes Fulp, Cammie, MD  potassium chloride SA (K-DUR) 20 MEQ tablet Take 1 tablet (20 mEq total) by mouth daily. Patient taking differently: Take 20 mEq by mouth daily as needed (potassium).  01/07/19  Yes Sherwood Gambler, MD  sertraline (ZOLOFT) 100 MG tablet Take 1 tablet (100 mg total) by mouth daily. Patient taking differently: Take 150 mg by mouth daily.  08/29/17  Yes Clent Demark, PA-C  sucralfate (CARAFATE) 1 g tablet Take 1 tablet (1 g total) by mouth 4 (four) times daily -  with meals and at bedtime. 09/06/19  Yes Fulp, Cammie, MD  thiamine 100 MG tablet Take 1 tablet (100 mg total) by mouth daily. 10/16/18  Yes [name], MD  amitriptyline (ELAVIL) 25 MG tablet Take 1 tablet (25 mg total) by mouth at bedtime. Patient not taking: Reported on 08/08/2019 10/15/18 08/08/19  [name], MD  gabapentin (NEURONTIN) 100 MG capsule Take 1 capsule (100 mg total) by mouth 2 (two) times daily. Patient not taking: Reported on 08/08/2019 10/15/18 08/08/19  [name], MD  promethazine (PHENERGAN) 25 MG tablet Take 1 tablet (25 mg total) by mouth every 6 (six) hours as needed for nausea or vomiting. Patient not taking: Reported on 08/08/2019 03/02/19 08/08/19  Kinnie Feil, PA-C    Allergies    Robaxin [methocarbamol], Shellfish-derived products, Trazodone, Trazodone and nefazodone, Adhesive [tape], Latex, Toradol [ketorolac tromethamine], Contrast media [iodinated diagnostic agents], and Reglan [metoclopramide]  Review of Systems   Review of Systems  Constitutional: Negative for fever.  Respiratory: Negative for shortness of breath.   Cardiovascular: Positive for chest pain.  Gastrointestinal: Positive for abdominal pain, blood in stool and nausea. Negative for diarrhea  and vomiting.  Genitourinary: Negative for dysuria.  All other systems reviewed and are negative.   Physical Exam Updated Vital Signs BP (!) 123/93 (BP Location: Left Arm)   Pulse (!) 104   Temp 98.5 F (36.9 C) (Oral)   Resp 16   Ht 1.753 m (5\' 9" )   Wt 59 kg   SpO2 100%   BMI 19.20 kg/m   Physical Exam Vitals and nursing note reviewed.  Constitutional:      Appearance: He is well-developed. He is  not ill-appearing.  HENT:     Head: Normocephalic and atraumatic.  Eyes:     Pupils: Pupils are equal, round, and reactive to light.  Cardiovascular:     Rate and Rhythm: Normal rate and regular rhythm.     Heart sounds: Normal heart sounds. No murmur.  Pulmonary:     Effort: Pulmonary effort is normal. No respiratory distress.     Breath sounds: Decreased breath sounds present. No wheezing.  Abdominal:     General: Bowel sounds are normal.     Palpations: Abdomen is soft.     Tenderness: There is abdominal tenderness. There is no rebound.  Musculoskeletal:     Cervical back: Neck supple.     Right lower leg: No tenderness. No edema.     Left lower leg: No tenderness. No edema.  Skin:    General: Skin is warm and dry.  Neurological:     Mental Status: He is alert and oriented to person, place, and time.  Psychiatric:        Mood and Affect: Mood normal.     ED Results / Procedures / Treatments   Labs (all labs ordered are listed, but only abnormal results are displayed) Labs Reviewed  BASIC METABOLIC PANEL - Abnormal; Notable for the following components:      Result Value   Sodium 133 (*)    CO2 17 (*)    All other components within normal limits  CBC - Abnormal; Notable for the following components:   RBC 3.72 (*)    Hemoglobin 11.1 (*)    HCT 33.8 (*)    RDW 16.7 (*)    Platelets 146 (*)    All other components within normal limits  HEPATIC FUNCTION PANEL - Abnormal; Notable for the following components:   Total Protein 8.8 (*)    AST 100 (*)    ALT 50  (*)    Bilirubin, Direct 0.7 (*)    All other components within normal limits  LIPASE, BLOOD  ETHANOL  TROPONIN I (HIGH SENSITIVITY)  TROPONIN I (HIGH SENSITIVITY)  TROPONIN I (HIGH SENSITIVITY)  TROPONIN I (HIGH SENSITIVITY)    EKG EKG Interpretation  Date/Time:  Friday Sep 27 2019 18:49:34 EDT Ventricular Rate:  85 PR Interval:  106 QRS Duration: 82 QT Interval:  344 QTC Calculation: 409 R Axis:   71 Text Interpretation: Sinus rhythm with short PR Right atrial enlargement Borderline ECG Confirmed by Thayer Jew [phone]) on 09/27/2019 11:11:03 PM   Radiology DG Chest 2 View  Result Date: 09/27/2019 CLINICAL DATA:  Chest pain. EXAM: CHEST - 2 VIEW COMPARISON:  Sep 19, 2019 FINDINGS: There is no evidence of acute infiltrate, pleural effusion or pneumothorax. A 5 mm diameter slightly nodular appearing opacity is seen overlying the mid to lower left lung. This is not clearly seen on the prior study. The heart size and mediastinal contours are within normal limits. The visualized skeletal structures are unremarkable. IMPRESSION: 1. 5 mm diameter slightly nodular appearing opacity overlying the mid to lower left lung. While this may represent a nipple shadow, a small lung nodule cannot be excluded. 2. No acute cardiopulmonary disease. Electronically Signed   By: Virgina Norfolk M.D.   On: 09/27/2019 19:04    Procedures Procedures (including critical care time)  Medications Ordered in ED Medications  sodium chloride flush (NS) 0.9 % injection 3 mL (has no administration in time range)  alum & mag hydroxide-simeth (MAALOX/MYLANTA) 200-200-20 MG/5ML suspension 30 mL (30 mLs Oral  Given 09/28/19 0014)    And  lidocaine (XYLOCAINE) 2 % viscous mouth solution 15 mL (15 mLs Oral Given 09/28/19 0014)    ED Course  I have reviewed the triage vital signs and the nursing notes.  Pertinent labs & imaging results that were available during my care of the patient were reviewed by me and  considered in my medical decision making (see chart for details).    MDM Rules/Calculators/A&P                      Patient presents today with abdominal and chest pain.  History of the same.  History of chronic alcohol abuse and gastritis as well as chronic pancreatitis.  He is well-known to our ER with many prior visits.  He is overall nontoxic on exam and vital signs notable for pulse rate of 104.  EKG shows no acute ischemic changes.  Initial troponin 3.  Pain is very atypical and highly suspicious for GI related pathology including gastritis, peptic ulcer, pancreatitis.  Less likely ACS.  Repeat troponin is 12 which is a delta of 9.  This was obtained prior to my evaluation.  We will repeat with her troponin as again his story is highly atypical for ACS.  I have added liver function testing, lipase to his work-up.  He was given a GI cocktail.  Liver function tests elevated and a AST greater than ALT pattern.  Likely reflective of his ongoing alcohol abuse.  Third troponin is 5.  Feel this is very reassuring.  Suspect he has ongoing gastritis or ulcer.  His hemoglobin is 11 which is improved from prior and I have not seen any evidence of ongoing bleeding.  This is reassuring.  Patient instructed to consider cessation of alcohol and was given resources for outpatient management.  Continue Carafate and Creon at home.  After history, exam, and medical workup I feel the patient has been appropriately medically screened and is safe for discharge home. Pertinent diagnoses were discussed with the patient. Patient was given return precautions.   Final Clinical Impression(s) / ED Diagnoses Final diagnoses:  Epigastric pain    Rx / DC Orders ED Discharge Orders    None       Sahand Gosch, Barbette Hair, MD 09/28/19 [id number]

## 2019-09-28 NOTE — Discharge Instructions (Addendum)
You were seen today for abdominal and chest pain.  This is likely related to ongoing gastritis and alcohol use.  You should abstain from alcohol.  See resources provided if you need help.  Continue your Carafate and Creon at home.

## 2019-09-28 NOTE — ED Notes (Signed)
Pt  Reports he drinks daily and drank 2 -40oz beer on Friday

## 2019-09-28 NOTE — ED Notes (Signed)
Patient verbalizes understanding of discharge instructions . Opportunity for questions and answers were provided . Armband removed by staff ,Pt discharged from ED. W/C  offered at D/C  and Declined W/C at D/C and was escorted to lobby by RN.  

## 2019-09-28 NOTE — Discharge Instructions (Signed)
As discussed, your evaluation today has been largely reassuring.  But, it is important that you monitor your condition carefully, and do not hesitate to return to the ED if you develop new, or concerning changes in your condition. ? ?Otherwise, please follow-up with your physician for appropriate ongoing care. ? ?

## 2019-09-28 NOTE — ED Provider Notes (Signed)
[facility] EMERGENCY DEPARTMENT Provider Note   CSN: [id number] Arrival date & time: 09/28/19  [id number]     History Chief Complaint  Patient presents with  . Chest Pain    [name] is a 49 y.o. male.  HPI    Patient presents for the second time in 24 hours with concern for pain.  He notes that after discharge earlier today, following an evaluation in which his pain had diminished slightly he went home, but soon thereafter had recurrence of pain in a similar location, abdominal, epigastric, sore, severe, as well as generalized diffuse discomfort.  It is unclear if he took any medication for relief, but called EMS and presents again for evaluation. No other new medication since that evaluation, no other new activities, and he denies any new alcohol intake. Initially patient states that he has not had alcohol in several days, but on chart review it appears as though he had it within the past 36 hours. He has a history of multiple medical issues including chronic pain, alcohol abuse, substance abuse. He has been seen and evaluated here multiple times for chest pain, including 5 over the past 6 months. Now he presents primarily with pain in the epigastrium, with associated nausea, described as sharp, severe, unrelenting, not improved with anything.  History obtained by the patient, chart review and I spoke with provider who took care of him earlier in the day. Past Medical History:  Diagnosis Date  . Alcoholism /alcohol abuse ([place])   . Anemia   . Anxiety   . Arthritis    "knees; arms; elbows" (03/26/2015)  . Asthma   . Bipolar disorder ([place])   . Chronic bronchitis ([place])   . Chronic lower back pain   . Chronic pancreatitis ([place])   . Cocaine abuse ([place])   . Depression   . Family history of adverse reaction to anesthesia    "grandmother gets confused"  . Femoral condyle fracture ([place]) 03/08/2014   left medial/notes 03/09/2014  . GERD (gastroesophageal  reflux disease)   . H/O hiatal hernia   . H/O suicide attempt 10/2012  . High cholesterol   . History of blood transfusion 10/2012   "when I tried to commit suicide"  . History of stomach ulcers   . Hypertension   . Marijuana abuse, continuous   . Migraine    "a few times/year" (03/26/2015)  . Pneumonia 1990's X 3  . PTSD (post-traumatic stress disorder)   . Sickle cell trait ([place])   . WPW (Wolff-Parkinson-White syndrome)    [name] 03/06/2013    Patient Active Problem List   Diagnosis Date Noted  . AKI (acute kidney injury) ([place]) 11/13/2018  . Seizure ([place]) 11/13/2018  . Gastritis and gastroduodenitis   . Chest pain 01/08/2018  . GI bleed 11/24/2017  . Acute blood loss anemia 11/24/2017  . Atypical chest pain 11/24/2017  . Acute pancreatitis 09/28/2017  . Abdominal pain 05/27/2017  . Hematemesis 05/27/2017  . Tachycardia 03/18/2017  . Diarrhea 03/18/2017  . Acute on chronic pancreatitis ([place]) 12/17/2016  . Intractable nausea and vomiting 12/05/2016  . Verbally abusive behavior 12/05/2016  . Normocytic anemia 12/05/2016  . Alcohol use disorder, severe, dependence ([place]) 07/25/2016  . Cocaine use disorder, severe, dependence ([place]) 07/25/2016  . 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 Leukocytosis   . Hospital acquired PNA 05/20/2015  . Chronic pancreatitis ([place]) 05/18/2015  . Pseudocyst of pancreas 05/18/2015  . Polysubstance abuse (tobacco,  cocaine, THC, and ETOH) 03/26/2015  . Alcohol-induced chronic pancreatitis ([place])   . Essential hypertension 02/06/2014  . Mood disorder ([place]) 02/06/2014  . Alcohol-induced acute pancreatitis 11/28/2013  . Pancreatic pseudocyst/cyst 11/25/2013  . Severe protein-calorie malnutrition ([place]) 10/10/2013  . Suicide attempt ([place]) 10/08/2013  . 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 TOBACCO ABUSE 03/23/2007    Past Surgical History:  Procedure Laterality  Date  . BIOPSY  11/25/2017   Procedure: BIOPSY;  Surgeon: Arta Silence, MD;  Location: [facility] ENDOSCOPY;  Service: Endoscopy;;  . BIOPSY  10/14/2018   Procedure: BIOPSY;  Surgeon: Arta Silence, MD;  Location: [place];  Service: Endoscopy;;  . CARDIAC CATHETERIZATION    . ESOPHAGOGASTRODUODENOSCOPY (EGD) WITH PROPOFOL N/A 11/25/2017   Procedure: ESOPHAGOGASTRODUODENOSCOPY (EGD) WITH PROPOFOL;  Surgeon: Arta Silence, MD;  Location: [place];  Service: Endoscopy;  Laterality: N/A;  . ESOPHAGOGASTRODUODENOSCOPY (EGD) WITH PROPOFOL Left 10/14/2018   Procedure: ESOPHAGOGASTRODUODENOSCOPY (EGD) WITH PROPOFOL;  Surgeon: Arta Silence, MD;  Location: [facility] ENDOSCOPY;  Service: Endoscopy;  Laterality: Left;  . ESOPHAGOGASTRODUODENOSCOPY (EGD) WITH PROPOFOL N/A 11/14/2018   Procedure: ESOPHAGOGASTRODUODENOSCOPY (EGD) WITH PROPOFOL;  Surgeon: Laurence Spates, MD;  Location: [facility] ENDOSCOPY;  Service: Gastroenterology;  Laterality: N/A;  . EYE SURGERY Left 1990's   "result of trauma"   . FACIAL FRACTURE SURGERY Left 1990's   "result of trauma"   . FRACTURE SURGERY    . HERNIA REPAIR    . LEFT HEART CATHETERIZATION WITH CORONARY ANGIOGRAM Right 03/07/2013   Procedure: LEFT HEART CATHETERIZATION WITH CORONARY ANGIOGRAM;  Surgeon: [name], MD;  Location: [facility] CATH LAB;  Service: Cardiovascular;  Laterality: Right;  . UMBILICAL HERNIA REPAIR         Family History  Problem Relation Age of Onset  . Hypertension Mother   . Cirrhosis Mother   . Alcoholism Mother   . Hypertension Father   . Melanoma Father   . Hypertension Other   . Coronary artery disease Other     Social History   Tobacco Use  . Smoking status: Current Every Day Smoker    Packs/day: 1.00    Years: 33.00    Pack years: 33.00    Types: Cigarettes, E-cigarettes  . Smokeless tobacco: Never Used  Substance Use Topics  . Alcohol use: Yes  . Drug use: Yes    Types: Marijuana, Cocaine    Comment: daily marijuana use; last  cocaine use about 3 months ago    Home Medications Prior to Admission medications   Medication Sig Start Date End Date Taking? Authorizing Provider  acetaminophen (TYLENOL) 500 MG tablet Take 2 tablets (1,000 mg total) by mouth every 8 (eight) hours as needed for mild pain. 08/29/19   Elodia Florence., MD  chlordiazePOXIDE (LIBRIUM) 10 MG capsule Take 1 capsule (10 mg total) by mouth 3 (three) times daily as needed for anxiety or withdrawal. 09/06/19   Fulp, Cammie, MD  Cyanocobalamin (VITAMIN B-12 PO) Take 1 tablet by mouth daily.    [provider]  cyclobenzaprine (FLEXERIL) 10 MG tablet Take 1 tablet (10 mg total) by mouth 2 (two) times daily as needed for muscle spasms. 03/03/19   Recardo Evangelist, PA-C  famotidine (PEPCID) 20 MG tablet Take 1 tablet (20 mg total) by mouth 2 (two) times daily. 08/08/19   Carlisle Cater, PA-C  folic acid (FOLVITE) 1 MG tablet Take 1 tablet (1 mg total) by mouth daily. 11/26/17   [name], MD  hydrALAZINE (APRESOLINE)  25 MG tablet Take 1 tablet (25 mg total) by mouth 3 (three) times daily. 09/09/19   Fulp, Cammie, MD  hydrOXYzine (VISTARIL) 25 MG capsule Take 25 mg by mouth 3 (three) times daily as needed for anxiety.  06/19/19   [provider]  lipase/protease/amylase (CREON) 12000 units CPEP capsule Take 3 capsules (36,000 Units total) by mouth 3 (three) times daily with meals. For pancreatitis 09/06/19   Fulp, Cammie, MD  loratadine (CLARITIN) 10 MG tablet Take 1 tablet (10 mg total) by mouth daily as needed for allergies or rhinitis. (May purchase from over the counter): For allergies Patient taking differently: Take 10 mg by mouth daily. (May purchase from over the counter): For allergies 10/01/17   Aline August, MD  LORazepam (ATIVAN) 0.5 MG tablet Take 0.5 mg by mouth every 8 (eight) hours as needed for anxiety.    [provider]  metoprolol tartrate (LOPRESSOR) 100 MG tablet Take 1 tablet (100 mg total) by mouth 2  (two) times daily. To lower blood pressure 09/09/19   Fulp, Cammie, MD  Multiple Vitamin (MULTIVITAMIN WITH MINERALS) TABS tablet Take 1 tablet by mouth daily. 09/06/17   Bonnell Public, MD  NASONEX 50 MCG/ACT nasal spray Place 2 sprays into the nose daily. 06/19/19   [provider]  ondansetron (ZOFRAN) 4 MG tablet Take 1 tablet (4 mg total) by mouth every 8 (eight) hours as needed for nausea or vomiting. 11/24/18   Nuala Alpha A, PA-C  pantoprazole (PROTONIX) 40 MG tablet Take 1 tablet (40 mg total) by mouth 2 (two) times daily. 09/06/19 10/06/19  Fulp, Cammie, MD  potassium chloride SA (K-DUR) 20 MEQ tablet Take 1 tablet (20 mEq total) by mouth daily. Patient taking differently: Take 20 mEq by mouth daily as needed (potassium).  01/07/19   Sherwood Gambler, MD  sertraline (ZOLOFT) 100 MG tablet Take 1 tablet (100 mg total) by mouth daily. Patient taking differently: Take 150 mg by mouth daily.  08/29/17   Clent Demark, PA-C  sucralfate (CARAFATE) 1 g tablet Take 1 tablet (1 g total) by mouth 4 (four) times daily -  with meals and at bedtime. 09/06/19   Fulp, Cammie, MD  thiamine 100 MG tablet Take 1 tablet (100 mg total) by mouth daily. 10/16/18   [name], MD  amitriptyline (ELAVIL) 25 MG tablet Take 1 tablet (25 mg total) by mouth at bedtime. Patient not taking: Reported on 08/08/2019 10/15/18 08/08/19  [name], MD  gabapentin (NEURONTIN) 100 MG capsule Take 1 capsule (100 mg total) by mouth 2 (two) times daily. Patient not taking: Reported on 08/08/2019 10/15/18 08/08/19  [name], MD  promethazine (PHENERGAN) 25 MG tablet Take 1 tablet (25 mg total) by mouth every 6 (six) hours as needed for nausea or vomiting. Patient not taking: Reported on 08/08/2019 03/02/19 08/08/19  Kinnie Feil, PA-C    Allergies    Robaxin [methocarbamol], Shellfish-derived products, Trazodone, Trazodone and nefazodone, Adhesive [tape], Latex, Toradol [ketorolac tromethamine], Contrast media  [iodinated diagnostic agents], and Reglan [metoclopramide]  Review of Systems   Review of Systems  Constitutional: Negative for fever.  Respiratory: Negative for shortness of breath.   Cardiovascular: Positive for chest pain.  Gastrointestinal: Positive for abdominal pain, blood in stool and nausea. Negative for diarrhea and vomiting.  Genitourinary: Negative for dysuria.  All other systems reviewed and are negative.   Physical Exam Updated Vital Signs BP (!) 139/112 (BP Location: Right Arm)   Pulse [name])  106   Temp (!) 97.2 F (36.2 C) (Oral)   Resp (!) 24   Ht 5\' 9"  (1.753 m)   Wt 58 kg   SpO2 100%   BMI 18.88 kg/m   Physical Exam Vitals and nursing note reviewed.  Constitutional:      Appearance: He is well-developed. He is not ill-appearing.  HENT:     Head: Normocephalic and atraumatic.  Eyes:     Pupils: Pupils are equal, round, and reactive to light.  Cardiovascular:     Rate and Rhythm: Normal rate and regular rhythm.     Heart sounds: Normal heart sounds. No murmur.  Pulmonary:     Effort: Pulmonary effort is normal. No respiratory distress.     Breath sounds: No wheezing.  Abdominal:     General: Bowel sounds are normal.     Palpations: Abdomen is soft.     Tenderness: There is abdominal tenderness in the epigastric area. There is no rebound.  Musculoskeletal:     Cervical back: Neck supple.     Right lower leg: No edema.     Left lower leg: No edema.     Comments: No gross deformities  Skin:    General: Skin is warm and dry.  Neurological:     Mental Status: He is alert and oriented to person, place, and time.  Psychiatric:        Mood and Affect: Mood normal.     ED Results / Procedures / Treatments   Labs (all labs ordered are listed, but only abnormal results are displayed) Labs Reviewed  BASIC METABOLIC PANEL  CBC  ETHANOL  LIPASE, BLOOD  HEPATIC FUNCTION PANEL  TROPONIN I (HIGH SENSITIVITY)    EKG #1 EKG Interpretation  Date/Time:   Saturday Sep 28 2019 06:59:34 EDT Ventricular Rate:  102 PR Interval:    QRS Duration: 84 QT Interval:  324 QTC Calculation: 422 R Axis:   77 Text Interpretation: Sinus tachycardia Biatrial enlargement Confirmed by Randal Buba, April ([id number]) on 09/28/2019 7:02:57 AM  EKG #2   EKG Interpretation  Date/Time:  Saturday Sep 28 2019 07:50:02 EDT Ventricular Rate:  110 PR Interval:    QRS Duration: 74 QT Interval:  275 QTC Calculation: 372 R Axis:   76 Text Interpretation: Sinus tachycardia Biatrial enlargement T wave abnormality changed from earlier today, but similar to prior ECG Abnormal ekg Confirmed by Carmin Muskrat [phone]) on 09/28/2019 8:17:47 AM         Radiology DG Chest 2 View  Result Date: 09/27/2019 CLINICAL DATA:  Chest pain. EXAM: CHEST - 2 VIEW COMPARISON:  Sep 19, 2019 FINDINGS: There is no evidence of acute infiltrate, pleural effusion or pneumothorax. A 5 mm diameter slightly nodular appearing opacity is seen overlying the mid to lower left lung. This is not clearly seen on the prior study. The heart size and mediastinal contours are within normal limits. The visualized skeletal structures are unremarkable. IMPRESSION: 1. 5 mm diameter slightly nodular appearing opacity overlying the mid to lower left lung. While this may represent a nipple shadow, a small lung nodule cannot be excluded. 2. No acute cardiopulmonary disease. Electronically Signed   By: Virgina Norfolk M.D.   On: 09/27/2019 19:04    Procedures Procedures (including critical care time)  Medications Ordered in ED Medications  sucralfate (CARAFATE) 1 GM/10ML suspension 1 g (has no administration in time range)  droperidol (INAPSINE) 2.5 MG/ML injection 2.5 mg (has no administration in time range)  sodium chloride 0.9 %  bolus 1,000 mL (has no administration in time range)    ED Course  I have reviewed the triage vital signs and the nursing notes.  Pertinent labs & imaging results that were available  during my care of the patient were reviewed by me and considered in my medical decision making (see chart for details).    8:19 AM Patient continues to complain of pain in spite of initial meds.  Repeat EKG, as above, abnormal T waves, though with some changes similar to prior studies. Now on chart review clear from his discharge summary he was discharged after hospitalization about a month ago, had chest pain evaluation during that stay with pertinent findings as below: Hospital Course:  Chest pain:     - Patient with typical type chest pain.  C/o CP today.  Not positional, possibly worse with eating, ?better lying back.  BP similar in both arms.     - Repeat EKG 4/21 with t wave inversions in V3-V6.  These T wave inversions appear chronic.      - Trp are neg.  Repeat today negative.  D dimer elevated.     - follow VQ scan - negative for PE     - He did have a cardiac catheterization in 2014 which showed normal coronary arteries.     - 4/19: echo EF 60%, G1DD, no regional wall motion abnormalities   Update: Initial labs notable for elevation in creatinine, hyperkalemia, though there is some suspicion that this sample was erroneous.  Update: Patient ambulatory, in no gross distress.  Attempts at repeating the labs for veracity have been unsuccessful.    12:53 PM Additional labs eventually obtained, process, notable for unremarkable potassium value, creatinine value, more consistent with baseline values, and reassuring for lower suspicion of acute new pathology. Labs otherwise reassuring, patient ambulatory, in no distress, speaking clearly.  This adult male with chronic pain, alcohol abuse presents with ongoing pain, though is in no distress, awake, alert, ambulatory.  Evaluation here reassuring, as above he has had recent evaluation To facilitate outpatient care and was encouraged to do so.  In the hospital, and multiple ED visits as well.  Patient has recently engaged with our social work  team   Final Clinical Impression(s) / ED Diagnoses Final diagnoses:  Atypical chest pain     Carmin Muskrat, MD 09/28/19 [id number]

## 2019-09-30 LAB — I-STAT CHEM 8, ED
BUN: 19 mg/dL (ref 6–20)
Calcium, Ion: 1.09 mmol/L — ABNORMAL LOW (ref 1.15–1.40)
Chloride: 112 mmol/L — ABNORMAL HIGH (ref 98–111)
Creatinine, Ser: 1.1 mg/dL (ref 0.61–1.24)
Glucose, Bld: 96 mg/dL (ref 70–99)
HCT: 38 % — ABNORMAL LOW (ref 39.0–52.0)
Hemoglobin: 12.9 g/dL — ABNORMAL LOW (ref 13.0–17.0)
Potassium: 6.6 mmol/L (ref 3.5–5.1)
Sodium: 131 mmol/L — ABNORMAL LOW (ref 135–145)
TCO2: 17 mmol/L — ABNORMAL LOW (ref 22–32)

## 2019-10-04 ENCOUNTER — Ambulatory Visit: Payer: Self-pay | Admitting: Family Medicine

## 2019-10-06 ENCOUNTER — Encounter (HOSPITAL_COMMUNITY): Payer: Self-pay | Admitting: Emergency Medicine

## 2019-10-06 ENCOUNTER — Emergency Department (HOSPITAL_COMMUNITY): Payer: Medicaid Other

## 2019-10-06 ENCOUNTER — Emergency Department (HOSPITAL_COMMUNITY)
Admission: EM | Admit: 2019-10-06 | Discharge: 2019-10-06 | Disposition: A | Discharge status: Home / Self Care | Payer: Medicaid Other | Attending: Emergency Medicine | Admitting: Emergency Medicine

## 2019-10-06 DIAGNOSIS — K029 Dental caries, unspecified: Secondary | ICD-10-CM

## 2019-10-06 DIAGNOSIS — Z79899 Other long term (current) drug therapy: Secondary | ICD-10-CM | POA: Insufficient documentation

## 2019-10-06 DIAGNOSIS — R109 Unspecified abdominal pain: Secondary | ICD-10-CM | POA: Diagnosis not present

## 2019-10-06 DIAGNOSIS — K0889 Other specified disorders of teeth and supporting structures: Secondary | ICD-10-CM | POA: Diagnosis not present

## 2019-10-06 DIAGNOSIS — G8929 Other chronic pain: Secondary | ICD-10-CM | POA: Insufficient documentation

## 2019-10-06 DIAGNOSIS — R0789 Other chest pain: Secondary | ICD-10-CM | POA: Diagnosis not present

## 2019-10-06 DIAGNOSIS — Z91040 Latex allergy status: Secondary | ICD-10-CM | POA: Diagnosis not present

## 2019-10-06 DIAGNOSIS — I10 Essential (primary) hypertension: Secondary | ICD-10-CM | POA: Diagnosis not present

## 2019-10-06 DIAGNOSIS — R079 Chest pain, unspecified: Secondary | ICD-10-CM | POA: Diagnosis present

## 2019-10-06 DIAGNOSIS — F17210 Nicotine dependence, cigarettes, uncomplicated: Secondary | ICD-10-CM | POA: Insufficient documentation

## 2019-10-06 LAB — CBC
HCT: 32.1 % — ABNORMAL LOW (ref 39.0–52.0)
Hemoglobin: 10.2 g/dL — ABNORMAL LOW (ref 13.0–17.0)
MCH: 28.9 pg (ref 26.0–34.0)
MCHC: 31.8 g/dL (ref 30.0–36.0)
MCV: 90.9 fL (ref 80.0–100.0)
Platelets: 292 10*3/uL (ref 150–400)
RBC: 3.53 MIL/uL — ABNORMAL LOW (ref 4.22–5.81)
RDW: 17 % — ABNORMAL HIGH (ref 11.5–15.5)
WBC: 8.4 10*3/uL (ref 4.0–10.5)
nRBC: 0 % (ref 0.0–0.2)

## 2019-10-06 LAB — HEPATIC FUNCTION PANEL
ALT: 32 U/L (ref 0–44)
AST: 30 U/L (ref 15–41)
Albumin: 3.6 g/dL (ref 3.5–5.0)
Alkaline Phosphatase: 85 U/L (ref 38–126)
Bilirubin, Direct: <0.1 mg/dL (ref 0.0–0.2)
Total Bilirubin: 0.6 mg/dL (ref 0.3–1.2)
Total Protein: 7.3 g/dL (ref 6.5–8.1)

## 2019-10-06 LAB — BASIC METABOLIC PANEL
Anion gap: 8 (ref 5–15)
BUN: <5 mg/dL — ABNORMAL LOW (ref 6–20)
CO2: 20 mmol/L — ABNORMAL LOW (ref 22–32)
Calcium: 9.1 mg/dL (ref 8.9–10.3)
Chloride: 111 mmol/L (ref 98–111)
Creatinine, Ser: 0.55 mg/dL — ABNORMAL LOW (ref 0.61–1.24)
GFR calc Af Amer: >60 mL/min (ref >60)
GFR calc non Af Amer: >60 mL/min (ref >60)
Glucose, Bld: 87 mg/dL (ref 70–99)
Potassium: 3.9 mmol/L (ref 3.5–5.1)
Sodium: 139 mmol/L (ref 135–145)

## 2019-10-06 LAB — LIPASE, BLOOD: Lipase: 26 U/L (ref 11–51)

## 2019-10-06 LAB — TROPONIN I (HIGH SENSITIVITY): Troponin I (High Sensitivity): 3 ng/L (ref <18)

## 2019-10-06 MED ORDER — LIDOCAINE VISCOUS HCL 2 % MT SOLN
15.0000 mL | OROMUCOSAL | 0 refills | Status: DC | PRN
Start: 2019-10-06 — End: 2019-10-10

## 2019-10-06 MED ORDER — SODIUM CHLORIDE 0.9% FLUSH
3.0000 mL | Freq: Once | INTRAVENOUS | Status: AC
  Administered 2019-10-06: 3 mL via INTRAVENOUS

## 2019-10-06 MED ORDER — AMOXICILLIN-POT CLAVULANATE 875-125 MG PO TABS
1.0000 | ORAL_TABLET | Freq: Two times a day (BID) | ORAL | 0 refills | Status: DC
Start: 2019-10-06 — End: 2019-11-05

## 2019-10-06 MED ORDER — LIDOCAINE VISCOUS HCL 2 % MT SOLN
15.0000 mL | Freq: Once | OROMUCOSAL | Status: AC
  Administered 2019-10-06: 15 mL via ORAL
  Filled 2019-10-06: qty 15

## 2019-10-06 MED ORDER — ONDANSETRON 4 MG PO TBDP
4.0000 mg | ORAL_TABLET | Freq: Three times a day (TID) | ORAL | 0 refills | Status: DC | PRN
Start: 2019-10-06 — End: 2020-03-10

## 2019-10-06 MED ORDER — ALUM & MAG HYDROXIDE-SIMETH 200-200-20 MG/5ML PO SUSP
30.0000 mL | Freq: Once | ORAL | Status: AC
  Administered 2019-10-06: 30 mL via ORAL
  Filled 2019-10-06: qty 30

## 2019-10-06 MED ORDER — OXYCODONE-ACETAMINOPHEN 5-325 MG PO TABS
1.0000 | ORAL_TABLET | Freq: Once | ORAL | Status: AC
  Administered 2019-10-06: 1 via ORAL
  Filled 2019-10-06: qty 1

## 2019-10-06 NOTE — ED Triage Notes (Addendum)
Pt arrives via [facility] with reports of chest pain that awoke him from sleep approx 1 hour ago, radiation of pain to the neck, endorses n/v/d over the past week. Pt received 4mg  zofran, 1 sl nitro with no relief in pain, significant decrease in BP after. Pt a/ox4, resp e/u, nad. EMS VS 112/70, HR 64. 20g IV R hand.

## 2019-10-06 NOTE — ED Notes (Signed)
Patient verbalizes understanding of discharge instructions. Opportunity for questioning and answers were provided. Armband removed by staff, pt discharged from ED.  

## 2019-10-06 NOTE — Discharge Instructions (Addendum)
Please take antibiotic as prescribed.  Please call the phone number of the dentist I have provided you with.  Please call Monday/Tuesday to make an appointment and inform them that you were seen in the emergency department and referred for dental pain.  Please take antibiotics for your dental infection. Please follow-up with your primary care doctor, please keep your appointment with gastroenterology and pulmonology.

## 2019-10-06 NOTE — ED Provider Notes (Signed)
[facility] EMERGENCY DEPARTMENT Provider Note   CSN: [id number] Arrival date & time: 10/06/19  1005     History Chief Complaint  Patient presents with  . Chest Pain    [name] is a 49 y.o. male with a history of alcohol abuse, chronic anemia, anxiety, asthma, bipolar, chronic pancreatitis with chronic stable pseudocyst, cocaine abuse, hiatal hernia, HLD, HTN, PTSD, migraines  HPI  Patient is a 49 year old male presented today with chest pain, abdominal pain, left upper dental pain, subjective fevers, neck and back stiffness, nausea, vomiting, diarrhea.   Patient states that his chest pain abdominal pain and diarrhea have been chronic issues and ongoing for quite some time.  He states they are unchanged today.  He states that we brought him in today with his left upper dental pain.  He denies any lightheadedness, dizziness, headaches.  He states he has not had alcohol in approximately a week however on my review of the EMR it appears a he has had this previously and has a history of claiming this with evidence of alcohol use.  It also appears on EMR that he has chronic pain and substance abuse and alcohol abuse issues.  He has been seen and evaluated in emergency department for similar symptoms approximately 20 times in the past 1 year--most recently was seen 1 week ago twice in 1 week.  Patient states his dental pain is left upper molar.  He states it is achy, 10/10 pain has been ongoing for approximately 5 days.  He states it makes it difficult to eat.  He denies any swelling but states that his gum feels very tender.  Patient is specifically requesting morphine and GI cocktail.  He says that this is a helpful commendation for him.  He denies any vomiting today.  On reevaluation patient states that he has actually drank alcohol last 2 days.  He states that he gets shaky and anxious when he does not drink for long peers of time.  He states that he has been  taking his Creon, famotidine and Carafate at home he states that this helps his symptoms.  He has appointments already scheduled with gastroenterology, pulmonology and his primary care doctor at [facility].  HPI: A 49 year old patient with a history of hypertension and hypercholesterolemia presents for evaluation of chest pain. Initial onset of pain was more than 6 hours ago. The patient's chest pain is not worse with exertion. The patient complains of nausea. The patient's chest pain is not middle- or left-sided, is not well-localized, is not described as heaviness/pressure/tightness, is not sharp and does not radiate to the arms/jaw/neck. The patient denies diaphoresis. The patient has smoked in the past 90 days. The patient has no history of stroke, has no history of peripheral artery disease, denies any history of treated diabetes, has no relevant family history of coronary artery disease (first degree relative at less than age 59) and does not have an elevated BMI (>=30).   Past Medical History:  Diagnosis Date  . Alcoholism /alcohol abuse ([place])   . Anemia   . Anxiety   . Arthritis    "knees; arms; elbows" (03/26/2015)  . Asthma   . Bipolar disorder ([place])   . Chronic bronchitis ([place])   . Chronic lower back pain   . Chronic pancreatitis ([place])   . Cocaine abuse ([place])   . Depression   . Family history of adverse reaction to anesthesia    "grandmother gets confused"  .  Femoral condyle fracture ([place]) 03/08/2014   left medial/notes 03/09/2014  . GERD (gastroesophageal reflux disease)   . H/O hiatal hernia   . H/O suicide attempt 10/2012  . High cholesterol   . History of blood transfusion 10/2012   "when I tried to commit suicide"  . History of stomach ulcers   . Hypertension   . Marijuana abuse, continuous   . Migraine    "a few times/year" (03/26/2015)  . Pneumonia 1990's X 3  . PTSD (post-traumatic stress disorder)   . Sickle cell trait ([place])   . WPW  (Wolff-Parkinson-White syndrome)    [name] 03/06/2013    Patient Active Problem List   Diagnosis Date Noted  . AKI (acute kidney injury) ([place]) 11/13/2018  . Seizure ([place]) 11/13/2018  . Gastritis and gastroduodenitis   . Chest pain 01/08/2018  . GI bleed 11/24/2017  . Acute blood loss anemia 11/24/2017  . Atypical chest pain 11/24/2017  . Acute pancreatitis 09/28/2017  . Abdominal pain 05/27/2017  . Hematemesis 05/27/2017  . Tachycardia 03/18/2017  . Diarrhea 03/18/2017  . Acute on chronic pancreatitis ([place]) 12/17/2016  . Intractable nausea and vomiting 12/05/2016  . Verbally abusive behavior 12/05/2016  . Normocytic anemia 12/05/2016  . Alcohol use disorder, severe, dependence ([place]) 07/25/2016  . Cocaine use disorder, severe, dependence ([place]) 07/25/2016  . Major depressive disorder, recurrent severe without psychotic features ([place]) 07/20/2016  . Leukocytosis   . Hospital acquired PNA 05/20/2015  . Chronic pancreatitis ([place]) 05/18/2015  . Pseudocyst of pancreas 05/18/2015  . Polysubstance abuse (tobacco, cocaine, THC, and ETOH) 03/26/2015  . Alcohol-induced chronic pancreatitis ([place])   . Essential hypertension 02/06/2014  . Mood disorder ([place]) 02/06/2014  . Alcohol-induced acute pancreatitis 11/28/2013  . Pancreatic pseudocyst/cyst 11/25/2013  . Severe protein-calorie malnutrition ([place]) 10/10/2013  . Suicide attempt ([place]) 10/08/2013  . 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 TOBACCO ABUSE 03/23/2007    Past Surgical History:  Procedure Laterality Date  . BIOPSY  11/25/2017   Procedure: BIOPSY;  Surgeon: Arta Silence, MD;  Location: [facility] ENDOSCOPY;  Service: Endoscopy;;  . BIOPSY  10/14/2018   Procedure: BIOPSY;  Surgeon: Arta Silence, MD;  Location: [facility];  Service: Endoscopy;;  . CARDIAC CATHETERIZATION    . ESOPHAGOGASTRODUODENOSCOPY (EGD) WITH PROPOFOL N/A 11/25/2017   Procedure: ESOPHAGOGASTRODUODENOSCOPY (EGD) WITH PROPOFOL;   Surgeon: Arta Silence, MD;  Location: [facility];  Service: Endoscopy;  Laterality: N/A;  . ESOPHAGOGASTRODUODENOSCOPY (EGD) WITH PROPOFOL Left 10/14/2018   Procedure: ESOPHAGOGASTRODUODENOSCOPY (EGD) WITH PROPOFOL;  Surgeon: Arta Silence, MD;  Location: [facility] ENDOSCOPY;  Service: Endoscopy;  Laterality: Left;  . ESOPHAGOGASTRODUODENOSCOPY (EGD) WITH PROPOFOL N/A 11/14/2018   Procedure: ESOPHAGOGASTRODUODENOSCOPY (EGD) WITH PROPOFOL;  Surgeon: Laurence Spates, MD;  Location: [facility] ENDOSCOPY;  Service: Gastroenterology;  Laterality: N/A;  . EYE SURGERY Left 1990's   "result of trauma"   . FACIAL FRACTURE SURGERY Left 1990's   "result of trauma"   . FRACTURE SURGERY    . HERNIA REPAIR    . LEFT HEART CATHETERIZATION WITH CORONARY ANGIOGRAM Right 03/07/2013   Procedure: LEFT HEART CATHETERIZATION WITH CORONARY ANGIOGRAM;  Surgeon: [name], MD;  Location: [facility] CATH LAB;  Service: Cardiovascular;  Laterality: Right;  . UMBILICAL HERNIA REPAIR         Family History  Problem Relation Age of Onset  . Hypertension Mother   . Cirrhosis Mother   . Alcoholism Mother   . Hypertension Father   . Melanoma Father   . Hypertension Other   .  Coronary artery disease Other     Social History   Tobacco Use  . Smoking status: Current Every Day Smoker    Packs/day: 1.00    Years: 33.00    Pack years: 33.00    Types: Cigarettes, E-cigarettes  . Smokeless tobacco: Never Used  Substance Use Topics  . Alcohol use: Yes  . Drug use: Yes    Types: Marijuana, Cocaine    Comment: daily marijuana use; last cocaine use about 3 months ago    Home Medications Prior to Admission medications   Medication Sig Start Date End Date Taking? Authorizing Provider  acetaminophen (TYLENOL) 500 MG tablet Take 2 tablets (1,000 mg total) by mouth every 8 (eight) hours as needed for mild pain. 08/29/19   Elodia Florence., MD  amoxicillin-clavulanate (AUGMENTIN) 875-125 MG tablet Take 1 tablet by mouth every 12  (twelve) hours. 10/06/19   Tedd Sias, PA  chlordiazePOXIDE (LIBRIUM) 10 MG capsule Take 1 capsule (10 mg total) by mouth 3 (three) times daily as needed for anxiety or withdrawal. 09/06/19   Fulp, Cammie, MD  Cyanocobalamin (VITAMIN B-12 PO) Take 1 tablet by mouth daily.    [provider]  cyclobenzaprine (FLEXERIL) 10 MG tablet Take 1 tablet (10 mg total) by mouth 2 (two) times daily as needed for muscle spasms. 03/03/19   Recardo Evangelist, PA-C  famotidine (PEPCID) 20 MG tablet Take 1 tablet (20 mg total) by mouth 2 (two) times daily. 08/08/19   Carlisle Cater, PA-C  folic acid (FOLVITE) 1 MG tablet Take 1 tablet (1 mg total) by mouth daily. 11/26/17   [name], MD  hydrALAZINE (APRESOLINE) 25 MG tablet Take 1 tablet (25 mg total) by mouth 3 (three) times daily. 09/09/19   Fulp, Cammie, MD  hydrOXYzine (VISTARIL) 25 MG capsule Take 25 mg by mouth 3 (three) times daily as needed for anxiety.  06/19/19   [provider]  lipase/protease/amylase (CREON) 12000 units CPEP capsule Take 3 capsules (36,000 Units total) by mouth 3 (three) times daily with meals. For pancreatitis 09/06/19   Fulp, Cammie, MD  loratadine (CLARITIN) 10 MG tablet Take 1 tablet (10 mg total) by mouth daily as needed for allergies or rhinitis. (May purchase from over the counter): For allergies Patient taking differently: Take 10 mg by mouth daily. (May purchase from over the counter): For allergies 10/01/17   Aline August, MD  LORazepam (ATIVAN) 0.5 MG tablet Take 0.5 mg by mouth every 8 (eight) hours as needed for anxiety.    [provider]  metoprolol tartrate (LOPRESSOR) 100 MG tablet Take 1 tablet (100 mg total) by mouth 2 (two) times daily. To lower blood pressure 09/09/19   Fulp, Cammie, MD  Multiple Vitamin (MULTIVITAMIN WITH MINERALS) TABS tablet Take 1 tablet by mouth daily. 09/06/17   Bonnell Public, MD  NASONEX 50 MCG/ACT nasal spray Place 2 sprays into the nose daily. 06/19/19    [provider]  ondansetron (ZOFRAN ODT) 4 MG disintegrating tablet Take 1 tablet (4 mg total) by mouth every 8 (eight) hours as needed for nausea or vomiting. 10/06/19   Zuriyah Shatz, Kathleene Hazel, PA  ondansetron (ZOFRAN) 4 MG tablet Take 1 tablet (4 mg total) by mouth every 8 (eight) hours as needed for nausea or vomiting. 11/24/18   Nuala Alpha A, PA-C  pantoprazole (PROTONIX) 40 MG tablet Take 1 tablet (40 mg total) by mouth 2 (two) times daily. 09/06/19 10/06/19  Fulp, Ander Gaster, MD  potassium chloride SA (  K-DUR) 20 MEQ tablet Take 1 tablet (20 mEq total) by mouth daily. Patient taking differently: Take 20 mEq by mouth daily as needed (potassium).  01/07/19   Sherwood Gambler, MD  sertraline (ZOLOFT) 100 MG tablet Take 1 tablet (100 mg total) by mouth daily. Patient taking differently: Take 150 mg by mouth daily.  08/29/17   Clent Demark, PA-C  sucralfate (CARAFATE) 1 g tablet Take 1 tablet (1 g total) by mouth 4 (four) times daily -  with meals and at bedtime. 09/06/19   Fulp, Cammie, MD  thiamine 100 MG tablet Take 1 tablet (100 mg total) by mouth daily. 10/16/18   [name], MD  amitriptyline (ELAVIL) 25 MG tablet Take 1 tablet (25 mg total) by mouth at bedtime. Patient not taking: Reported on 08/08/2019 10/15/18 08/08/19  [name], MD  gabapentin (NEURONTIN) 100 MG capsule Take 1 capsule (100 mg total) by mouth 2 (two) times daily. Patient not taking: Reported on 08/08/2019 10/15/18 08/08/19  [name], MD  promethazine (PHENERGAN) 25 MG tablet Take 1 tablet (25 mg total) by mouth every 6 (six) hours as needed for nausea or vomiting. Patient not taking: Reported on 08/08/2019 03/02/19 08/08/19  Kinnie Feil, PA-C    Allergies    Robaxin [methocarbamol], Shellfish-derived products, Trazodone, Trazodone and nefazodone, Adhesive [tape], Latex, Toradol [ketorolac tromethamine], Contrast media [iodinated diagnostic agents], and Reglan [metoclopramide]  Review of Systems   Review of  Systems  Constitutional: Positive for chills, fatigue and fever.  HENT: Negative for congestion.   Eyes: Negative for pain.  Respiratory: Negative for cough and shortness of breath.   Cardiovascular: Positive for chest pain. Negative for leg swelling.  Gastrointestinal: Positive for abdominal pain, diarrhea, nausea and vomiting.  Genitourinary: Negative for dysuria.  Musculoskeletal: Negative for myalgias.  Skin: Negative for rash.  Neurological: Negative for dizziness and headaches.    Physical Exam Updated Vital Signs BP 115/79   Pulse 62   Temp 99 F (37.2 C) (Oral)   Resp 17   SpO2 100%   Physical Exam Vitals and nursing note reviewed.  Constitutional:      General: He is not in acute distress.    Comments: Patient is pleasant 49 year old male appears somewhat older than stated age.  Chronically ill-appearing and cachectic.  In no acute distress.  HENT:     Head: Normocephalic and atraumatic.     Nose: Nose normal.     Mouth/Throat:     Mouth: Mucous membranes are dry.      Comments: Poor dentition throughout. Eyes:     General: No scleral icterus. Neck:     Comments: No lymphadenopathy Cardiovascular:     Rate and Rhythm: Normal rate and regular rhythm.     Pulses: Normal pulses.     Heart sounds: Normal heart sounds.     Comments: RRR, no murmurs rubs or gallops. Pulmonary:     Effort: Pulmonary effort is normal. No respiratory distress.     Breath sounds: No wheezing.     Comments: Breath sounds clear in all fields.  No increased work of breathing.  Speaking in full sentences without difficulty. Abdominal:     Palpations: Abdomen is soft.     Tenderness: There is no abdominal tenderness. There is no guarding or rebound.     Comments: Nontender, nonprotuberant, soft abdomen with no guarding, rebound or CVA tenderness.  Musculoskeletal:     Cervical back: Normal range of motion and neck supple. No  tenderness.     Right lower leg: No edema.     Left lower  leg: No edema.     Comments: No tenderness to palpation of midline neck, full range of motion of neck.  No midline tenderness of spine.  No upper or lower extremity tenderness to palpation.  No focal bony step-off or deformity or tenderness.  Skin:    General: Skin is warm and dry.     Capillary Refill: Capillary refill takes less than 2 seconds.  Neurological:     Mental Status: He is alert. Mental status is at baseline.     Comments: Sensation intact in all 4 extremities.  Moves all 4 extremities spontaneously.  No notable weakness.  He is alert and oriented x3.  Psychiatric:        Mood and Affect: Mood normal.        Behavior: Behavior normal.     ED Results / Procedures / Treatments   Labs (all labs ordered are listed, but only abnormal results are displayed) Labs Reviewed  BASIC METABOLIC PANEL - Abnormal; Notable for the following components:      Result Value   CO2 20 (*)    BUN <5 (*)    Creatinine, Ser 0.55 (*)    All other components within normal limits  CBC - Abnormal; Notable for the following components:   RBC 3.53 (*)    Hemoglobin 10.2 (*)    HCT 32.1 (*)    RDW 17.0 (*)    All other components within normal limits  HEPATIC FUNCTION PANEL  LIPASE, BLOOD  TROPONIN I (HIGH SENSITIVITY)    EKG None  Radiology DG Chest 2 View  Result Date: 10/06/2019 CLINICAL DATA:  Chest pain EXAM: CHEST - 2 VIEW COMPARISON:  09/28/2019 chest radiograph. FINDINGS: Stable cardiomediastinal silhouette with normal heart size. No pneumothorax. No pleural effusion. Lungs appear clear, with no acute consolidative airspace disease and no pulmonary edema. IMPRESSION: No active cardiopulmonary disease. Electronically Signed   By: Ilona Sorrel M.D.   On: 10/06/2019 10:43    Procedures Procedures (including critical care time)  Medications Ordered in ED Medications  sodium chloride flush (NS) 0.9 % injection 3 mL (3 mLs Intravenous Given 10/06/19 1216)  oxyCODONE-acetaminophen  (PERCOCET/ROXICET) 5-325 MG per tablet 1 tablet (1 tablet Oral Given 10/06/19 1211)  alum & mag hydroxide-simeth (MAALOX/MYLANTA) 200-200-20 MG/5ML suspension 30 mL (30 mLs Oral Given 10/06/19 1213)    And  lidocaine (XYLOCAINE) 2 % viscous mouth solution 15 mL (15 mLs Oral Given 10/06/19 1213)    ED Course  I have reviewed the triage vital signs and the nursing notes.  Pertinent labs & imaging results that were available during my care of the patient were reviewed by me and considered in my medical decision making (see chart for details).  Patient has had-year-old male presented today with left upper dental pain as well as several other issues which appear to be very chronic for him.  He is complaining of chest pain, abdominal pain, nausea, vomiting, fevers.  On further agree it appears that his dental pain is the new symptom today that brought him into the ED.  He does have poor dentition throughout and a partially decayed left upper molar.  He does have gumline tenderness and tenderness to percussion of the tooth.  Will provide patient with Augmentin as antibiotic treatment for likely periapical abscess and referred to dentist.  Clinical Course as of Oct 05 1249  Sun Oct 06, 2019  1243 CBCBMP without acute abnormality.  Hepatic function panel within normal limits.  Ultrasound results are significant/no anemia.  Lipase within normal limits. Troponin X1 within normal limits.  Patient's symptoms have been ongoing for 1 week without change. Doubt ACS.     [WF]    Clinical Course User Index [WF] Tedd Sias, [name]   Patient already has appointment with pulmonology, gastroenterology and primary care.  Will recommend that he closely follow-up with them.  His EKG is unchanged from prior today.  He has no evidence of acute ischemia.  Troponin X1 was within normal limits.  Low heart score and low suspicion for ACS today.  I discussed this case with my attending physician who cosigned this note  including patient's presenting symptoms, physical exam, and planned diagnostics and interventions. Attending physician stated agreement with plan or made changes to plan which were implemented.   We will discharge at this time.  Patient is understanding of plan and discharged with Augmentin and Zofran for nausea.  MDM Rules/Calculators/A&P HEAR Score: 3                    Final Clinical Impression(s) / ED Diagnoses Final diagnoses:  Dental caries  Atypical chest pain  Chronic abdominal pain    Rx / DC Orders ED Discharge Orders         Ordered    amoxicillin-clavulanate (AUGMENTIN) 875-125 MG tablet  Every 12 hours     10/06/19 1247    ondansetron (ZOFRAN ODT) 4 MG disintegrating tablet  Every 8 hours PRN     10/06/19 1251           Tedd Sias, [name] 10/06/19 1251    Virgel Manifold, MD 10/06/19 [phone]

## 2019-10-10 ENCOUNTER — Other Ambulatory Visit: Payer: Self-pay | Admitting: Family Medicine

## 2019-10-10 MED ORDER — LIDOCAINE VISCOUS HCL 2 % MT SOLN
15.0000 mL | OROMUCOSAL | 0 refills | Status: DC | PRN
Start: 2019-10-10 — End: 2019-10-16

## 2019-10-10 MED FILL — LIDOCAINE 2% VISCOUS SOLN: 2 | 6 days supply | Qty: 100 | Fill #0

## 2019-10-10 NOTE — Telephone Encounter (Signed)
Refill sent.

## 2019-10-10 NOTE — Telephone Encounter (Addendum)
1) Medication(s) Requested (by name): -lidocaine (XYLOCAINE) 2 % solution   2) Pharmacy of Choice: -[facility]  3) Special Requests: Pt received this at the ED and is out. Pt would like this for his tooth pain until his appt 10/16/2019. Please follow up

## 2019-10-10 NOTE — Telephone Encounter (Signed)
Called pt unable to leave message regarding new med refills.

## 2019-10-16 ENCOUNTER — Ambulatory Visit: Payer: Self-pay | Attending: Family Medicine | Admitting: Physician Assistant

## 2019-10-16 ENCOUNTER — Other Ambulatory Visit: Payer: Self-pay

## 2019-10-16 DIAGNOSIS — Z09 Encounter for follow-up examination after completed treatment for conditions other than malignant neoplasm: Secondary | ICD-10-CM

## 2019-10-16 DIAGNOSIS — K089 Disorder of teeth and supporting structures, unspecified: Secondary | ICD-10-CM

## 2019-10-16 DIAGNOSIS — R197 Diarrhea, unspecified: Secondary | ICD-10-CM

## 2019-10-16 DIAGNOSIS — M79672 Pain in left foot: Secondary | ICD-10-CM

## 2019-10-16 MED ORDER — LIDOCAINE VISCOUS HCL 2 % MT SOLN: 15.0000 mL | OROMUCOSAL | 0 refills | Status: DC | PRN

## 2019-10-16 MED ORDER — DICYCLOMINE HCL 10 MG PO CAPS: 10.0000 mg | ORAL_CAPSULE | Freq: Three times a day (TID) | ORAL | 0 refills | Status: DC

## 2019-10-16 NOTE — Progress Notes (Signed)
Patient ID: [name], male   DOB: March 31, 1971, 49 y.o.   MRN: [id number] Virtual Visit via Telephone Note  I connected with [name] on 10/16/19 at  9:10 AM EDT by telephone and verified that I am speaking with the correct person using two identifiers.   I discussed the limitations, risks, security and privacy concerns of performing an evaluation and management service by telephone and the availability of in person appointments. I also discussed with the patient that there may be a patient responsible charge related to this service. The patient expressed understanding and agreed to proceed.  PATIENT visit by telephone virtually in the context of Covid-19 pandemic. Patient location:  home My Location:  [facility] office Persons on the call:  Me and the patient   History of Present Illness: After being seen in the ED 10/06/2019.  Seen at dentist 10/10/2019.  He had 3 teeth pulled that day.  Wanting pain medications.  Keeps asking different ways.  No fever.  Some abdominal cramping.  Finished augmentin.  No blood in stool.  Says he has not drank alcohol in 3 weeks  Some diarrhea.  Sees GI 11/05/2019.  Also c/o L foot pain and wants to see a foot doctor.  No N/V.     Observations/Objective:  NAD.  A&Ox3   Assessment and Plan: 1. Poor dentition Seeking pain meds.  He just had 3 teeth pulled from dentist and if pain meds are indicated, he can call them.  Dr Chapman Fitch wrote extensive notes about not giving pain meds here and about drug seeking behavior  2. Encounter for examination following treatment at hospital  3. Diarrhea, unspecified type - dicyclomine (BENTYL) 10 MG capsule; Take 1 capsule (10 mg total) by mouth 4 (four) times daily -  before meals and at bedtime.  Dispense: 30 capsule; Refill: 0 - lidocaine (XYLOCAINE) 2 % solution; Use as directed 15 mLs in the mouth or throat as needed for mouth pain.  Dispense: 100 mL; Refill: 0  4. Left foot pain - Ambulatory referral to  Podiatry    Follow Up Instructions: See dr fulp in 1 month   I discussed the assessment and treatment plan with the patient. The patient was provided an opportunity to ask questions and all were answered. The patient agreed with the plan and demonstrated an understanding of the instructions.   The patient was advised to call back or seek an in-person evaluation if the symptoms worsen or if the condition fails to improve as anticipated.  I provided 20 minutes of non-face-to-face time during this encounter.   Freeman Caldron, PA-C

## 2019-10-20 ENCOUNTER — Encounter (HOSPITAL_COMMUNITY): Payer: Self-pay | Admitting: Emergency Medicine

## 2019-10-20 ENCOUNTER — Emergency Department (HOSPITAL_COMMUNITY)
Admission: EM | Admit: 2019-10-20 | Discharge: 2019-10-21 | Disposition: A | Discharge status: Home / Self Care | Payer: Medicaid Other | Attending: Emergency Medicine | Admitting: Emergency Medicine

## 2019-10-20 ENCOUNTER — Other Ambulatory Visit: Payer: Self-pay

## 2019-10-20 DIAGNOSIS — Z79899 Other long term (current) drug therapy: Secondary | ICD-10-CM | POA: Insufficient documentation

## 2019-10-20 DIAGNOSIS — G8929 Other chronic pain: Secondary | ICD-10-CM | POA: Diagnosis not present

## 2019-10-20 DIAGNOSIS — I10 Essential (primary) hypertension: Secondary | ICD-10-CM | POA: Insufficient documentation

## 2019-10-20 DIAGNOSIS — R079 Chest pain, unspecified: Secondary | ICD-10-CM | POA: Diagnosis not present

## 2019-10-20 DIAGNOSIS — R197 Diarrhea, unspecified: Secondary | ICD-10-CM | POA: Diagnosis not present

## 2019-10-20 DIAGNOSIS — R1084 Generalized abdominal pain: Secondary | ICD-10-CM | POA: Insufficient documentation

## 2019-10-20 DIAGNOSIS — J45909 Unspecified asthma, uncomplicated: Secondary | ICD-10-CM | POA: Insufficient documentation

## 2019-10-20 DIAGNOSIS — F17210 Nicotine dependence, cigarettes, uncomplicated: Secondary | ICD-10-CM | POA: Diagnosis not present

## 2019-10-20 DIAGNOSIS — Z91040 Latex allergy status: Secondary | ICD-10-CM | POA: Insufficient documentation

## 2019-10-20 MED ORDER — SODIUM CHLORIDE 0.9 % IV BOLUS
1000.0000 mL | Freq: Once | INTRAVENOUS | Status: AC
  Administered 2019-10-21: 1000 mL via INTRAVENOUS

## 2019-10-20 MED ORDER — SODIUM CHLORIDE 0.9% FLUSH: 3.0000 mL | Freq: Once | INTRAVENOUS | Status: DC

## 2019-10-20 NOTE — ED Triage Notes (Signed)
Pt to triage via [facility].  Reports pain to center of chest all day with SOB.  Refused ASA and NTG per EMS.

## 2019-10-21 ENCOUNTER — Emergency Department (HOSPITAL_COMMUNITY)
Admission: EM | Admit: 2019-10-21 | Discharge: 2019-10-22 | Disposition: A | Discharge status: Home / Self Care | Payer: Medicaid Other | Source: Home / Self Care | Attending: Emergency Medicine | Admitting: Emergency Medicine

## 2019-10-21 DIAGNOSIS — R0789 Other chest pain: Secondary | ICD-10-CM | POA: Insufficient documentation

## 2019-10-21 DIAGNOSIS — R1010 Upper abdominal pain, unspecified: Secondary | ICD-10-CM | POA: Insufficient documentation

## 2019-10-21 DIAGNOSIS — G8929 Other chronic pain: Secondary | ICD-10-CM | POA: Insufficient documentation

## 2019-10-21 DIAGNOSIS — I10 Essential (primary) hypertension: Secondary | ICD-10-CM | POA: Insufficient documentation

## 2019-10-21 DIAGNOSIS — Z79899 Other long term (current) drug therapy: Secondary | ICD-10-CM | POA: Insufficient documentation

## 2019-10-21 DIAGNOSIS — J45909 Unspecified asthma, uncomplicated: Secondary | ICD-10-CM | POA: Insufficient documentation

## 2019-10-21 DIAGNOSIS — F17210 Nicotine dependence, cigarettes, uncomplicated: Secondary | ICD-10-CM | POA: Insufficient documentation

## 2019-10-21 LAB — CBC
HCT: 32.1 % — ABNORMAL LOW (ref 39.0–52.0)
Hemoglobin: 10.3 g/dL — ABNORMAL LOW (ref 13.0–17.0)
MCH: 27.9 pg (ref 26.0–34.0)
MCHC: 32.1 g/dL (ref 30.0–36.0)
MCV: 87 fL (ref 80.0–100.0)
Platelets: 132 K/uL — ABNORMAL LOW (ref 150–400)
RBC: 3.69 MIL/uL — ABNORMAL LOW (ref 4.22–5.81)
RDW: 17.7 % — ABNORMAL HIGH (ref 11.5–15.5)
WBC: 8.9 K/uL (ref 4.0–10.5)
nRBC: 0 % (ref 0.0–0.2)

## 2019-10-21 LAB — BASIC METABOLIC PANEL WITH GFR
Anion gap: 11 (ref 5–15)
BUN: <5 mg/dL — ABNORMAL LOW (ref 6–20)
CO2: 23 mmol/L (ref 22–32)
Calcium: 9.5 mg/dL (ref 8.9–10.3)
Chloride: 102 mmol/L (ref 98–111)
Creatinine, Ser: 0.63 mg/dL (ref 0.61–1.24)
GFR calc Af Amer: >60 mL/min
GFR calc non Af Amer: >60 mL/min
Glucose, Bld: 105 mg/dL — ABNORMAL HIGH (ref 70–99)
Potassium: 3.8 mmol/L (ref 3.5–5.1)
Sodium: 136 mmol/L (ref 135–145)

## 2019-10-21 LAB — HEPATIC FUNCTION PANEL
ALT: 29 U/L (ref 0–44)
AST: 34 U/L (ref 15–41)
Albumin: 3.5 g/dL (ref 3.5–5.0)
Alkaline Phosphatase: 91 U/L (ref 38–126)
Bilirubin, Direct: 0.2 mg/dL (ref 0.0–0.2)
Indirect Bilirubin: 0.3 mg/dL (ref 0.3–0.9)
Total Bilirubin: 0.5 mg/dL (ref 0.3–1.2)
Total Protein: 6.7 g/dL (ref 6.5–8.1)

## 2019-10-21 LAB — TROPONIN I (HIGH SENSITIVITY)
Troponin I (High Sensitivity): <2 ng/L (ref <18)
Troponin I (High Sensitivity): 3 ng/L
Troponin I (High Sensitivity): <2 ng/L (ref <18)

## 2019-10-21 LAB — C DIFFICILE QUICK SCREEN W PCR REFLEX
C Diff antigen: NEGATIVE
C Diff interpretation: NOT DETECTED
C Diff toxin: NEGATIVE

## 2019-10-21 LAB — LIPASE, BLOOD: Lipase: 22 U/L (ref 11–51)

## 2019-10-21 MED ORDER — LIDOCAINE VISCOUS HCL 2 % MT SOLN
15.0000 mL | Freq: Once | OROMUCOSAL | Status: AC
  Administered 2019-10-21: 15 mL via ORAL
  Filled 2019-10-21: qty 15

## 2019-10-21 MED ORDER — ALUM & MAG HYDROXIDE-SIMETH 200-200-20 MG/5ML PO SUSP
30.0000 mL | Freq: Once | ORAL | Status: AC
  Administered 2019-10-21: 30 mL via ORAL
  Filled 2019-10-21: qty 30

## 2019-10-21 NOTE — ED Provider Notes (Signed)
[facility] EMERGENCY DEPARTMENT Provider Note   CSN: [id number] Arrival date & time: 10/20/19  1902     History Chief Complaint  Patient presents with   Chest Pain    [name] is a 49 y.o. male.  Patient presents to the emergency department with multiple complaints.  He comes to the ER by ambulance.  Patient complaining of diarrhea that has been ongoing for several weeks.  He is experiencing diffuse abdominal pain associated with diarrhea.  He also is complaining of chest pain.  Patient reports that he has been using his Bentyl without improvement.        Past Medical History:  Diagnosis Date   Alcoholism /alcohol abuse ([place])    Anemia    Anxiety    Arthritis    "knees; arms; elbows" (03/26/2015)   Asthma    Bipolar disorder (HCC)    Chronic bronchitis (HCC)    Chronic lower back pain    Chronic pancreatitis ([place])    Cocaine abuse ([place])    Depression    Family history of adverse reaction to anesthesia    "grandmother gets confused"   Femoral condyle fracture ([place]) 03/08/2014   left medial/notes 03/09/2014   GERD (gastroesophageal reflux disease)    H/O hiatal hernia    H/O suicide attempt 10/2012   High cholesterol    History of blood transfusion 10/2012   "when I tried to commit suicide"   History of stomach ulcers    Hypertension    Marijuana abuse, continuous    Migraine    "a few times/year" (03/26/2015)   Pneumonia 1990's X 3   PTSD (post-traumatic stress disorder)    Sickle cell trait ([place])    WPW (Wolff-Parkinson-White syndrome)    [name] 03/06/2013    Patient Active Problem List   Diagnosis Date Noted   AKI (acute kidney injury) ([place]) 11/13/2018   Seizure ([place]) 11/13/2018   Gastritis and gastroduodenitis    Chest pain 01/08/2018   GI bleed 11/24/2017   Acute blood loss anemia 11/24/2017   Atypical chest pain 11/24/2017   Acute pancreatitis 09/28/2017   Abdominal pain  05/27/2017   Hematemesis 05/27/2017   Tachycardia 03/18/2017   Diarrhea 03/18/2017   Acute on chronic pancreatitis ([place]) 12/17/2016   Intractable nausea and vomiting 12/05/2016   Verbally abusive behavior 12/05/2016   Normocytic anemia 12/05/2016   Alcohol use disorder, severe, dependence ([place]) 07/25/2016   Cocaine use disorder, severe, dependence ([place]) 07/25/2016   Major depressive disorder, 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   Leukocytosis    Hospital acquired PNA 05/20/2015   Chronic pancreatitis ([name]) 05/18/2015   Pseudocyst of pancreas 05/18/2015   Polysubstance abuse (tobacco, cocaine, THC, and ETOH) 03/26/2015   Alcohol-induced chronic pancreatitis ([place])    Essential hypertension 02/06/2014   Mood disorder ([place]) 02/06/2014   Alcohol-induced acute pancreatitis 11/28/2013   Pancreatic pseudocyst/cyst 11/25/2013   Severe protein-calorie malnutrition ([place]) 10/10/2013   Suicide attempt ([place]) 10/08/2013   [name] Parkinson White pattern seen on electrocardiogram 10/03/2012   TOBACCO ABUSE 03/23/2007    Past Surgical History:  Procedure Laterality Date   BIOPSY  11/25/2017   Procedure: BIOPSY;  Surgeon: Arta Silence, MD;  Location: [place];  Service: Endoscopy;;   BIOPSY  10/14/2018   Procedure: BIOPSY;  Surgeon: Arta Silence, MD;  Location: [place];  Service: Endoscopy;;   CARDIAC CATHETERIZATION     ESOPHAGOGASTRODUODENOSCOPY (EGD) WITH PROPOFOL N/A 11/25/2017   Procedure: ESOPHAGOGASTRODUODENOSCOPY (EGD) WITH PROPOFOL;  Surgeon: Arta Silence, MD;  Location[facility]COPY;  Service: Endoscopy;  Laterality: N/A;   ESOPHAGOGASTRODUODENOSCOPY (EGD) WITH PROPOFOL Left 10/14/2018   Procedure: ESOPHAGOGASTRODUODENOSCOPY (EGD) WITH PROPOFOL;  Surgeon: Arta Silence, MD;  Location: [facility] ENDOSCOPY;  Service: Endoscopy;  Laterality: Left;   ESOPHAGOGASTRODUODENOSCOPY (EGD) WITH PROPOFOL N/A 11/14/2018   Procedure:  ESOPHAGOGASTRODUODENOSCOPY (EGD) WITH PROPOFOL;  Surgeon: Laurence Spates, MD;  Location: [facility] ENDOSCOPY;  Service: Gastroenterology;  Laterality: N/A;   EYE SURGERY Left 1990's   "result of trauma"    FACIAL FRACTURE SURGERY Left 1990's   "result of trauma"    FRACTURE SURGERY     HERNIA REPAIR     LEFT HEART CATHETERIZATION WITH CORONARY ANGIOGRAM Right 03/07/2013   Procedure: LEFT HEART CATHETERIZATION WITH CORONARY ANGIOGRAM;  Surgeon: [name], MD;  Location: [place] CATH LAB;  Service: Cardiovascular;  Laterality: Right;   UMBILICAL HERNIA REPAIR         Family History  Problem Relation Age of Onset   Hypertension Mother    Cirrhosis Mother    Alcoholism Mother    Hypertension Father    Melanoma Father    Hypertension Other    Coronary artery disease Other     Social History   Tobacco Use   Smoking status: Current Every Day Smoker    Packs/day: 1.00    Years: 33.00    Pack years: 33.00    Types: Cigarettes, E-cigarettes   Smokeless tobacco: Never Used  Scientific laboratory technician Use: Some days  Substance Use Topics   Alcohol use: Yes   Drug use: Yes    Types: Marijuana, Cocaine    Comment: daily marijuana use; last cocaine use about 3 months ago    Home Medications Prior to Admission medications   Medication Sig Start Date End Date Taking? Authorizing Provider  acetaminophen (TYLENOL) 500 MG tablet Take 2 tablets (1,000 mg total) by mouth every 8 (eight) hours as needed for mild pain. 08/29/19  Yes Elodia Florence., MD  chlordiazePOXIDE (LIBRIUM) 10 MG capsule Take 1 capsule (10 mg total) by mouth 3 (three) times daily as needed for anxiety or withdrawal. 09/06/19  Yes Fulp, Cammie, MD  Cyanocobalamin (VITAMIN B-12 PO) Take 1 tablet by mouth daily.   Yes [provider]  cyclobenzaprine (FLEXERIL) 10 MG tablet Take 1 tablet (10 mg total) by mouth 2 (two) times daily as needed for muscle spasms. 03/03/19  Yes Recardo Evangelist, PA-C    dicyclomine (BENTYL) 10 MG capsule Take 1 capsule (10 mg total) by mouth 4 (four) times daily -  before meals and at bedtime. 10/16/19  Yes Argentina Donovan, PA-C  famotidine (PEPCID) 20 MG tablet Take 1 tablet (20 mg total) by mouth 2 (two) times daily. 08/08/19  Yes Carlisle Cater, PA-C  folic acid (FOLVITE) 1 MG tablet Take 1 tablet (1 mg total) by mouth daily. 11/26/17  Yes [name], MD  hydrALAZINE (APRESOLINE) 25 MG tablet Take 1 tablet (25 mg total) by mouth 3 (three) times daily. 09/09/19  Yes Fulp, Cammie, MD  hydrOXYzine (VISTARIL) 25 MG capsule Take 25 mg by mouth 3 (three) times daily as needed for anxiety.  06/19/19  Yes [provider]  lidocaine (XYLOCAINE) 2 % solution Use as directed 15 mLs in the mouth or throat as needed for mouth pain. 10/16/19  Yes Argentina Donovan, PA-C  lipase/protease/amylase (CREON) 12000 units CPEP capsule Take 3 capsules (36,000 Units total) by mouth 3 (three) times daily  with meals. For pancreatitis 09/06/19  Yes Fulp, Cammie, MD  loratadine (CLARITIN) 10 MG tablet Take 1 tablet (10 mg total) by mouth daily as needed for allergies or rhinitis. (May purchase from over the counter): For allergies Patient taking differently: Take 10 mg by mouth daily. (May purchase from over the counter): For allergies 10/01/17  Yes Aline August, MD  LORazepam (ATIVAN) 0.5 MG tablet Take 0.5 mg by mouth every 8 (eight) hours as needed for anxiety.   Yes [provider]  metoprolol tartrate (LOPRESSOR) 100 MG tablet Take 1 tablet (100 mg total) by mouth 2 (two) times daily. To lower blood pressure 09/09/19  Yes Fulp, Cammie, MD  Multiple Vitamin (MULTIVITAMIN WITH MINERALS) TABS tablet Take 1 tablet by mouth daily. 09/06/17  Yes Bonnell Public, MD  NASONEX 50 MCG/ACT nasal spray Place 2 sprays into the nose daily. 06/19/19  Yes [provider]  ondansetron (ZOFRAN ODT) 4 MG disintegrating tablet Take 1 tablet (4 mg total) by mouth every 8 (eight)  hours as needed for nausea or vomiting. 10/06/19  Yes Fondaw, Wylder S, PA  ondansetron (ZOFRAN) 4 MG tablet Take 1 tablet (4 mg total) by mouth every 8 (eight) hours as needed for nausea or vomiting. 11/24/18  Yes Nuala Alpha A, PA-C  pantoprazole (PROTONIX) 40 MG tablet Take 1 tablet (40 mg total) by mouth 2 (two) times daily. 09/06/19 10/20/28 Yes Fulp, Cammie, MD  potassium chloride SA (K-DUR) 20 MEQ tablet Take 1 tablet (20 mEq total) by mouth daily. Patient taking differently: Take 20 mEq by mouth daily as needed (potassium).  01/07/19  Yes Sherwood Gambler, MD  sertraline (ZOLOFT) 100 MG tablet Take 1 tablet (100 mg total) by mouth daily. Patient taking differently: Take 150 mg by mouth daily.  08/29/17  Yes Clent Demark, PA-C  sucralfate (CARAFATE) 1 g tablet Take 1 tablet (1 g total) by mouth 4 (four) times daily -  with meals and at bedtime. 09/06/19  Yes Fulp, Cammie, MD  thiamine 100 MG tablet Take 1 tablet (100 mg total) by mouth daily. 10/16/18  Yes [name], MD  amoxicillin-clavulanate (AUGMENTIN) 875-125 MG tablet Take 1 tablet by mouth every 12 (twelve) hours. Patient not taking: Reported on 10/21/2019 10/06/19   Tedd Sias, PA  amitriptyline (ELAVIL) 25 MG tablet Take 1 tablet (25 mg total) by mouth at bedtime. Patient not taking: Reported on 08/08/2019 10/15/18 08/08/19  [name], MD  gabapentin (NEURONTIN) 100 MG capsule Take 1 capsule (100 mg total) by mouth 2 (two) times daily. Patient not taking: Reported on 08/08/2019 10/15/18 08/08/19  [name], MD  promethazine (PHENERGAN) 25 MG tablet Take 1 tablet (25 mg total) by mouth every 6 (six) hours as needed for nausea or vomiting. Patient not taking: Reported on 08/08/2019 03/02/19 08/08/19  Kinnie Feil, PA-C    Allergies    Robaxin [methocarbamol], Shellfish-derived products, Trazodone, Trazodone and nefazodone, Adhesive [tape], Latex, Toradol [ketorolac tromethamine], Contrast media [iodinated diagnostic  agents], and Reglan [metoclopramide]  Review of Systems   Review of Systems  Cardiovascular: Positive for chest pain.  Gastrointestinal: Positive for abdominal pain and diarrhea.  All other systems reviewed and are negative.   Physical Exam Updated Vital Signs BP (!) 141/91    Pulse 86    Temp 97.8 F (36.6 C)    Resp 10    Ht 5\' 8"  (1.727 m)    SpO2 98%    BMI 19.44 kg/m  Physical Exam Vitals and nursing note reviewed.  Constitutional:      General: He is not in acute distress.    Appearance: Normal appearance. He is well-developed.  HENT:     Head: Normocephalic and atraumatic.     Right Ear: Hearing normal.     Left Ear: Hearing normal.     Nose: Nose normal.  Eyes:     Conjunctiva/sclera: Conjunctivae normal.     Pupils: Pupils are equal, round, and reactive to light.  Cardiovascular:     Rate and Rhythm: Regular rhythm.     Heart sounds: S1 normal and S2 normal. No murmur heard.  No friction rub. No gallop.   Pulmonary:     Effort: Pulmonary effort is normal. No respiratory distress.     Breath sounds: Normal breath sounds.  Chest:     Chest wall: No tenderness.  Abdominal:     General: Bowel sounds are normal.     Palpations: Abdomen is soft.     Tenderness: There is abdominal tenderness (Generalized). There is no guarding or rebound. Negative signs include Murphy's sign and McBurney's sign.     Hernia: No hernia is present.  Musculoskeletal:        General: Normal range of motion.     Cervical back: Normal range of motion and neck supple.  Skin:    General: Skin is warm and dry.     Findings: No rash.  Neurological:     Mental Status: He is alert and oriented to person, place, and time.     GCS: GCS eye subscore is 4. GCS verbal subscore is 5. GCS motor subscore is 6.     Cranial Nerves: No cranial nerve deficit.     Sensory: No sensory deficit.     Coordination: Coordination normal.  Psychiatric:        Speech: Speech normal.        Behavior: Behavior  normal.        Thought Content: Thought content normal.     ED Results / Procedures / Treatments   Labs (all labs ordered are listed, but only abnormal results are displayed) Labs Reviewed  BASIC METABOLIC PANEL - Abnormal; Notable for the following components:      Result Value   Glucose, Bld 105 (*)    BUN <5 (*)    All other components within normal limits  CBC - Abnormal; Notable for the following components:   RBC 3.69 (*)    Hemoglobin 10.3 (*)    HCT 32.1 (*)    RDW 17.7 (*)    Platelets 132 (*)    All other components within normal limits  C DIFFICILE QUICK SCREEN W PCR REFLEX  GASTROINTESTINAL PANEL BY PCR, STOOL (REPLACES STOOL CULTURE)  HEPATIC FUNCTION PANEL  LIPASE, BLOOD  TROPONIN I (HIGH SENSITIVITY)    EKG EKG Interpretation  Date/Time:  Sunday October 20 2019 19:00:15 EDT Ventricular Rate:  98 PR Interval:  106 QRS Duration: 78 QT Interval:  332 QTC Calculation: 423 R Axis:   70 Text Interpretation: Sinus rhythm with short PR Biatrial enlargement Nonspecific T wave abnormality Abnormal ECG No significant change since last tracing Confirmed by Orpah Greek [phone]) on 10/21/2019 12:23:16 AM   Radiology No results found.  Procedures Procedures (including critical care time)  Medications Ordered in ED Medications  sodium chloride flush (NS) 0.9 % injection 3 mL (has no administration in time range)  sodium chloride 0.9 % bolus 1,000 mL (1,000 mLs  Intravenous New Bag/Given 10/21/19 0025)  alum & mag hydroxide-simeth (MAALOX/MYLANTA) 200-200-20 MG/5ML suspension 30 mL (30 mLs Oral Given 10/21/19 0140)    And  lidocaine (XYLOCAINE) 2 % viscous mouth solution 15 mL (15 mLs Oral Given 10/21/19 0140)    ED Course  I have reviewed the triage vital signs and the nursing notes.  Pertinent labs & imaging results that were available during my care of the patient were reviewed by me and considered in my medical decision making (see chart for  details).    MDM Rules/Calculators/A&P                          Patient's work-up has been unremarkable.  He does have a tendency to frequently present to this ED with these complaints which are chronic.  No changes today noted that would require any further intervention.  Patient did become very upset when he was not receiving Dilaudid.  He then began to ask for ketamine which was also denied.  At this point patient became angry and asked to be discharged.  Final Clinical Impression(s) / ED Diagnoses Final diagnoses:  Chronic abdominal pain    Rx / DC Orders ED Discharge Orders    None       Aysen Shieh, Gwenyth Allegra, MD 10/21/19 [phone]

## 2019-10-21 NOTE — ED Notes (Signed)
This nurse was called to pt room after pt discussed discharge with MD. Pt was visibly agitated that he was not going to receive pain medication during stay. Pt became argumentative with this nurse about ketamine being a controlled substance and that he should be prescribed ketamine. Refused all attempts at a last set of vitals. PIV was removed and patient exited room stating "I'm just going to go to [facility] where they'll give me something for my pain".

## 2019-10-21 NOTE — ED Triage Notes (Signed)
Pt arrives via [facility] from home for c/o chest pain x12 hours, seen for the same yesterday. Requested morphine for pain from EMS. VSS, a/ox4, resp e/u, nad.

## 2019-10-22 LAB — GASTROINTESTINAL PANEL BY PCR, STOOL (REPLACES STOOL CULTURE)

## 2019-10-22 MED ORDER — LIDOCAINE VISCOUS HCL 2 % MT SOLN
15.0000 mL | OROMUCOSAL | 0 refills | Status: DC | PRN
Start: 2019-10-22 — End: 2019-10-29

## 2019-10-22 MED ORDER — ALUM & MAG HYDROXIDE-SIMETH 200-200-20 MG/5ML PO SUSP
30.0000 mL | Freq: Once | ORAL | Status: AC
  Administered 2019-10-22: 30 mL via ORAL
  Filled 2019-10-22: qty 30

## 2019-10-22 MED ORDER — LIDOCAINE VISCOUS HCL 2 % MT SOLN
15.0000 mL | Freq: Once | OROMUCOSAL | Status: AC
  Administered 2019-10-22: 15 mL via ORAL
  Filled 2019-10-22: qty 15

## 2019-10-22 MED ORDER — ONDANSETRON 4 MG PO TBDP
4.0000 mg | ORAL_TABLET | Freq: Once | ORAL | Status: AC
  Administered 2019-10-22: 4 mg via ORAL
  Filled 2019-10-22: qty 1

## 2019-10-22 NOTE — ED Provider Notes (Signed)
[facility] EMERGENCY DEPARTMENT Provider Note   CSN: [id number] Arrival date & time: 10/21/19  1609     History Chief Complaint  Patient presents with  . Chest Pain    [name] is a 49 y.o. male.  The history is provided by the patient and medical records.    49 year old male with history of alcohol abuse, anxiety, bipolar disorder, cocaine abuse, chronic abdominal pain, hyperlipidemia, hypertension, marijuana abuse, PTSD, presenting to the ED with abdominal/chest pain.  Patient seen in the ED last night for same.  He reports pain continues.  States he has been taking his usual medications from his GI physician without improvement.  States pain is upper abdomen tracking into the chest.  States it feels like "stomach ulcers and reflux".  He has not had any vomiting.  He also reports some loose stools over the past 2 weeks.  No fever or chills.  States his PCP is trying to get him set up with pain management.  He also has an upcoming appointment with GI in 2 weeks.  Past Medical History:  Diagnosis Date  . Alcoholism /alcohol abuse ([place])   . Anemia   . Anxiety   . Arthritis    "knees; arms; elbows" (03/26/2015)  . Asthma   . Bipolar disorder ([place])   . Chronic bronchitis ([place])   . Chronic lower back pain   . Chronic pancreatitis ([place])   . Cocaine abuse ([place])   . Depression   . Family history of adverse reaction to anesthesia    "grandmother gets confused"  . Femoral condyle fracture ([place]) 03/08/2014   left medial/notes 03/09/2014  . GERD (gastroesophageal reflux disease)   . H/O hiatal hernia   . H/O suicide attempt 10/2012  . High cholesterol   . History of blood transfusion 10/2012   "when I tried to commit suicide"  . History of stomach ulcers   . Hypertension   . Marijuana abuse, continuous   . Migraine    "a few times/year" (03/26/2015)  . Pneumonia 1990's X 3  . PTSD (post-traumatic stress disorder)   . Sickle cell trait ([place])   .  WPW (Wolff-Parkinson-White syndrome)    [name] 03/06/2013    Patient Active Problem List   Diagnosis Date Noted  . AKI (acute kidney injury) ([place]) 11/13/2018  . Seizure ([place]) 11/13/2018  . Gastritis and gastroduodenitis   . Chest pain 01/08/2018  . GI bleed 11/24/2017  . Acute blood loss anemia 11/24/2017  . Atypical chest pain 11/24/2017  . Acute pancreatitis 09/28/2017  . Abdominal pain 05/27/2017  . Hematemesis 05/27/2017  . Tachycardia 03/18/2017  . Diarrhea 03/18/2017  . Acute on chronic pancreatitis ([place]) 12/17/2016  . Intractable nausea and vomiting 12/05/2016  . Verbally abusive behavior 12/05/2016  . Normocytic anemia 12/05/2016  . Alcohol use disorder, severe, dependence ([place]) 07/25/2016  . Cocaine use disorder, severe, dependence ([place]) 07/25/2016  . 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 Leukocytosis   . Hospital acquired PNA 05/20/2015  . Chronic pancreatitis ([place]) 05/18/2015  . Pseudocyst of pancreas 05/18/2015  . Polysubstance abuse (tobacco, cocaine, THC, and ETOH) 03/26/2015  . Alcohol-induced chronic pancreatitis ([place])   . Essential hypertension 02/06/2014  . Mood disorder ([place]) 02/06/2014  . Alcohol-induced acute pancreatitis 11/28/2013  . Pancreatic pseudocyst/cyst 11/25/2013  . Severe protein-calorie malnutrition ([place]) 10/10/2013  . Suicide attempt ([place]) 10/08/2013  . 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 TOBACCO ABUSE  03/23/2007    Past Surgical History:  Procedure Laterality Date  . BIOPSY  11/25/2017   Procedure: BIOPSY;  Surgeon: Arta Silence, MD;  Location: [facility] ENDOSCOPY;  Service: Endoscopy;;  . BIOPSY  10/14/2018   Procedure: BIOPSY;  Surgeon: Arta Silence, MD;  Location: [facility];  Service: Endoscopy;;  . CARDIAC CATHETERIZATION    . ESOPHAGOGASTRODUODENOSCOPY (EGD) WITH PROPOFOL N/A 11/25/2017   Procedure: ESOPHAGOGASTRODUODENOSCOPY (EGD) WITH  PROPOFOL;  Surgeon: Arta Silence, MD;  Location: [facility];  Service: Endoscopy;  Laterality: N/A;  . ESOPHAGOGASTRODUODENOSCOPY (EGD) WITH PROPOFOL Left 10/14/2018   Procedure: ESOPHAGOGASTRODUODENOSCOPY (EGD) WITH PROPOFOL;  Surgeon: Arta Silence, MD;  Location: [facility] ENDOSCOPY;  Service: Endoscopy;  Laterality: Left;  . ESOPHAGOGASTRODUODENOSCOPY (EGD) WITH PROPOFOL N/A 11/14/2018   Procedure: ESOPHAGOGASTRODUODENOSCOPY (EGD) WITH PROPOFOL;  Surgeon: Laurence Spates, MD;  Location: [facility] ENDOSCOPY;  Service: Gastroenterology;  Laterality: N/A;  . EYE SURGERY Left 1990's   "result of trauma"   . FACIAL FRACTURE SURGERY Left 1990's   "result of trauma"   . FRACTURE SURGERY    . HERNIA REPAIR    . LEFT HEART CATHETERIZATION WITH CORONARY ANGIOGRAM Right 03/07/2013   Procedure: LEFT HEART CATHETERIZATION WITH CORONARY ANGIOGRAM;  Surgeon: [name], MD;  Location: [facility] CATH LAB;  Service: Cardiovascular;  Laterality: Right;  . UMBILICAL HERNIA REPAIR         Family History  Problem Relation Age of Onset  . Hypertension Mother   . Cirrhosis Mother   . Alcoholism Mother   . Hypertension Father   . Melanoma Father   . Hypertension Other   . Coronary artery disease Other     Social History   Tobacco Use  . Smoking status: Current Every Day Smoker    Packs/day: 1.00    Years: 33.00    Pack years: 33.00    Types: Cigarettes, E-cigarettes  . Smokeless tobacco: Never Used  Vaping Use  . Vaping Use: Some days  Substance Use Topics  . Alcohol use: Yes  . Drug use: Yes    Types: Marijuana, Cocaine    Comment: daily marijuana use; last cocaine use about 3 months ago    Home Medications Prior to Admission medications   Medication Sig Start Date End Date Taking? Authorizing Provider  acetaminophen (TYLENOL) 500 MG tablet Take 2 tablets (1,000 mg total) by mouth every 8 (eight) hours as needed for mild pain. 08/29/19   Elodia Florence., MD  amoxicillin-clavulanate (AUGMENTIN)  875-125 MG tablet Take 1 tablet by mouth every 12 (twelve) hours. Patient not taking: Reported on 10/21/2019 10/06/19   Tedd Sias, PA  chlordiazePOXIDE (LIBRIUM) 10 MG capsule Take 1 capsule (10 mg total) by mouth 3 (three) times daily as needed for anxiety or withdrawal. 09/06/19   Fulp, Cammie, MD  Cyanocobalamin (VITAMIN B-12 PO) Take 1 tablet by mouth daily.    [provider]  cyclobenzaprine (FLEXERIL) 10 MG tablet Take 1 tablet (10 mg total) by mouth 2 (two) times daily as needed for muscle spasms. 03/03/19   Recardo Evangelist, PA-C  dicyclomine (BENTYL) 10 MG capsule Take 1 capsule (10 mg total) by mouth 4 (four) times daily -  before meals and at bedtime. 10/16/19   Argentina Donovan, PA-C  famotidine (PEPCID) 20 MG tablet Take 1 tablet (20 mg total) by mouth 2 (two) times daily. 08/08/19   Carlisle Cater, PA-C  folic acid (FOLVITE) 1 MG tablet Take 1 tablet (1 mg total) by mouth daily. 11/26/17  [name], MD  hydrALAZINE (APRESOLINE) 25 MG tablet Take 1 tablet (25 mg total) by mouth 3 (three) times daily. 09/09/19   Fulp, Cammie, MD  hydrOXYzine (VISTARIL) 25 MG capsule Take 25 mg by mouth 3 (three) times daily as needed for anxiety.  06/19/19   [provider]  lidocaine (XYLOCAINE) 2 % solution Use as directed 15 mLs in the mouth or throat as needed for mouth pain. 10/16/19   Argentina Donovan, PA-C  lipase/protease/amylase (CREON) 12000 units CPEP capsule Take 3 capsules (36,000 Units total) by mouth 3 (three) times daily with meals. For pancreatitis 09/06/19   Fulp, Cammie, MD  loratadine (CLARITIN) 10 MG tablet Take 1 tablet (10 mg total) by mouth daily as needed for allergies or rhinitis. (May purchase from over the counter): For allergies Patient taking differently: Take 10 mg by mouth daily. (May purchase from over the counter): For allergies 10/01/17   Aline August, MD  LORazepam (ATIVAN) 0.5 MG tablet Take 0.5 mg by mouth every 8 (eight) hours as needed for  anxiety.    [provider]  metoprolol tartrate (LOPRESSOR) 100 MG tablet Take 1 tablet (100 mg total) by mouth 2 (two) times daily. To lower blood pressure 09/09/19   Fulp, Cammie, MD  Multiple Vitamin (MULTIVITAMIN WITH MINERALS) TABS tablet Take 1 tablet by mouth daily. 09/06/17   Bonnell Public, MD  NASONEX 50 MCG/ACT nasal spray Place 2 sprays into the nose daily. 06/19/19   [provider]  ondansetron (ZOFRAN ODT) 4 MG disintegrating tablet Take 1 tablet (4 mg total) by mouth every 8 (eight) hours as needed for nausea or vomiting. 10/06/19   Fondaw, Kathleene Hazel, PA  ondansetron (ZOFRAN) 4 MG tablet Take 1 tablet (4 mg total) by mouth every 8 (eight) hours as needed for nausea or vomiting. 11/24/18   Nuala Alpha A, PA-C  pantoprazole (PROTONIX) 40 MG tablet Take 1 tablet (40 mg total) by mouth 2 (two) times daily. 09/06/19 10/20/28  Fulp, Cammie, MD  potassium chloride SA (K-DUR) 20 MEQ tablet Take 1 tablet (20 mEq total) by mouth daily. Patient taking differently: Take 20 mEq by mouth daily as needed (potassium).  01/07/19   Sherwood Gambler, MD  sertraline (ZOLOFT) 100 MG tablet Take 1 tablet (100 mg total) by mouth daily. Patient taking differently: Take 150 mg by mouth daily.  08/29/17   Clent Demark, PA-C  sucralfate (CARAFATE) 1 g tablet Take 1 tablet (1 g total) by mouth 4 (four) times daily -  with meals and at bedtime. 09/06/19   Fulp, Cammie, MD  thiamine 100 MG tablet Take 1 tablet (100 mg total) by mouth daily. 10/16/18   [name], MD  amitriptyline (ELAVIL) 25 MG tablet Take 1 tablet (25 mg total) by mouth at bedtime. Patient not taking: Reported on 08/08/2019 10/15/18 08/08/19  [name], MD  gabapentin (NEURONTIN) 100 MG capsule Take 1 capsule (100 mg total) by mouth 2 (two) times daily. Patient not taking: Reported on 08/08/2019 10/15/18 08/08/19  [name], MD  promethazine (PHENERGAN) 25 MG tablet Take 1 tablet (25 mg total) by mouth every 6 (six)  hours as needed for nausea or vomiting. Patient not taking: Reported on 08/08/2019 03/02/19 08/08/19  Kinnie Feil, PA-C    Allergies    Robaxin [methocarbamol], Shellfish-derived products, Trazodone, Trazodone and nefazodone, Adhesive [tape], Latex, Toradol [ketorolac tromethamine], Contrast media [iodinated diagnostic agents], and Reglan [metoclopramide]  Review of Systems   Review  of Systems  Cardiovascular: Positive for chest pain.  Gastrointestinal: Positive for abdominal pain.  All other systems reviewed and are negative.   Physical Exam Updated Vital Signs BP 132/88 (BP Location: Right Arm)   Pulse 90   Temp (!) 97.4 F (36.3 C) (Oral)   Resp 18   SpO2 100%   Physical Exam Vitals and nursing note reviewed.  Constitutional:      Appearance: He is well-developed.     Comments: Sitting comfortably on stretcher initially, once door opened and I started talking with patient he grabbed his abdomen and started moaning  HENT:     Head: Normocephalic and atraumatic.  Eyes:     Conjunctiva/sclera: Conjunctivae normal.     Pupils: Pupils are equal, round, and reactive to light.  Cardiovascular:     Rate and Rhythm: Normal rate and regular rhythm.     Heart sounds: Normal heart sounds.  Pulmonary:     Effort: Pulmonary effort is normal.     Breath sounds: Normal breath sounds. No wheezing or rhonchi.  Abdominal:     General: Bowel sounds are normal.     Palpations: Abdomen is soft. There is no mass.     Tenderness: There is no abdominal tenderness. There is no rebound.  Musculoskeletal:        General: Normal range of motion.     Cervical back: Normal range of motion.  Skin:    General: Skin is warm and dry.  Neurological:     Mental Status: He is alert and oriented to person, place, and time.     ED Results / Procedures / Treatments   Labs (all labs ordered are listed, but only abnormal results are displayed) Labs Reviewed  TROPONIN I (HIGH SENSITIVITY)    TROPONIN I (HIGH SENSITIVITY)    EKG None  Radiology No results found.  Procedures Procedures (including critical care time)  Medications Ordered in ED Medications  alum & mag hydroxide-simeth (MAALOX/MYLANTA) 200-200-20 MG/5ML suspension 30 mL (30 mLs Oral Given 10/22/19 0356)    And  lidocaine (XYLOCAINE) 2 % viscous mouth solution 15 mL (15 mLs Oral Given 10/22/19 0356)  ondansetron (ZOFRAN-ODT) disintegrating tablet 4 mg (4 mg Oral Given 10/22/19 0359)    ED Course  I have reviewed the triage vital signs and the nursing notes.  Pertinent labs & imaging results that were available during my care of the patient were reviewed by me and considered in my medical decision making (see chart for details).    MDM Rules/Calculators/A&P  49 year old male presenting to the ED with abdominal/chest pain.  By time of evaluation he had been in the waiting room for 11+ hours.  He was noted to be playing cards in the lobby with other patients.  No emesis observed.  Patient well known to this facility and myself for same.  He was seen last night, became agitated when he was not given narcotics and wanted to be discharged.  He appears at his baseline tonight.  I walked by the room and he was calmly sitting on the bed, however when door opened he began holding his abdomen, moaning, and writhing around on the stretcher.  Troponins are negative x2 again today.  EKG without any acute ischemic changes.  I have very low suspicion for acute cardiac/abdominal process.  Patient was again informed that he would not be receiving narcotics from this emergency department.  He began asking for ketamine as he did last night.  I have explained to  him that is a controlled substance and we would not be doing that.  He was offered a GI cocktail and Zofran.  He stated "how we are going to me like this?, you need to treat my pain".  I have explained to the patient that he has a longstanding history of chronic abdominal  pain, this does not require emergency treatment/will not be cured today.  He has been referred to pain management, if they wish to prescribe narcotics for his pain that is up to their discretion but he will not receive them from the ER today.  Patient became argumentative, referring to ER visits in the past when he has received narcotics, referencing hospital in [place], etc.  At this point, I offered him GI cocktail and ODT Zofran once more, advised if he did not want them he could be discharged.  Patient agreed, also requested refill of viscous lidocaine which will be sent to his pharmacy.  I have recommended that he follow-up with GI as scheduled, continue to follow-up on pain management referrals with PCP.  Return here for new concerns.  Final Clinical Impression(s) / ED Diagnoses Final diagnoses:  Chronic abdominal pain    Rx / DC Orders ED Discharge Orders         Ordered    lidocaine (XYLOCAINE) 2 % solution  As needed     Discontinue  Reprint     10/22/19 0406           Larene Pickett, PA-C 10/22/19 0410    Orpah Greek, MD 10/22/19 [phone]

## 2019-10-22 NOTE — ED Notes (Signed)
This nurse was called to pt's room by pt who was agitated that pt was not receiving pain medicine for his pain. I again advised pt of the narcotic policy as a refresher from his visit the previous night when he became aggressive about wanting ketamine. Pt advised that he would be calling the President of the hospital and asked to speak to the charge. I retrieved Gabriel Cirri, ED Director, and she discussed with pt his illness. Pt told Gabriel Cirri that I had called him a "junkie" and all he wanted was to be out of his pain. Pt then caused himself to become tearful on command. PA was then called to pt's room to assess the pt.

## 2019-10-22 NOTE — Discharge Instructions (Signed)
Continue all your home medications. Follow-up with GI as scheduled. You should reach out to your primary care doctor if have ongoing issues until pain management referral is complete. Return here for any new/acute changes.

## 2019-10-22 NOTE — ED Notes (Signed)
After receiving discharge disposition, this nurse found pt walking the hallway in blue-orange portion of ED. I advised him to come back to his room so I could give him his paperwork and vitals before discharging him. Pt refused vitals. All discharge instructions reviewed with pt, including dental referral, new lidocaine prescription, and BRAT diet for stomach pains.

## 2019-10-29 ENCOUNTER — Telehealth: Payer: Self-pay | Admitting: Family Medicine

## 2019-10-29 MED ORDER — LIDOCAINE VISCOUS HCL 2 % MT SOLN: 15.0000 mL | OROMUCOSAL | 0 refills | Status: DC | PRN

## 2019-10-29 NOTE — Telephone Encounter (Signed)
Rx sent 

## 2019-10-29 NOTE — Telephone Encounter (Signed)
1) Medication(s) Requested (by name): lidocaine (XYLOCAINE) 2 % solution   2) Pharmacy of Choice:  [facility]    3) Special Requests: he is ina severe mouth pain    Approved medications will be sent to the pharmacy, we will reach out if there is an issue.  Requests made after 3pm may not be addressed until the following business day!  If a patient is unsure of the name of the medication(s) please note and ask patient to call back when they are able to provide all info, do not send to responsible party until all information is available!

## 2019-11-05 ENCOUNTER — Encounter: Payer: Self-pay | Admitting: Gastroenterology

## 2019-11-05 ENCOUNTER — Telehealth: Payer: Self-pay

## 2019-11-05 ENCOUNTER — Ambulatory Visit (INDEPENDENT_AMBULATORY_CARE_PROVIDER_SITE_OTHER): Payer: Self-pay | Admitting: Gastroenterology

## 2019-11-05 VITALS — BP 128/74 | Ht 68.0 in | Wt 114.4 lb

## 2019-11-05 DIAGNOSIS — Z8711 Personal history of peptic ulcer disease: Secondary | ICD-10-CM

## 2019-11-05 DIAGNOSIS — Z01818 Encounter for other preprocedural examination: Secondary | ICD-10-CM

## 2019-11-05 DIAGNOSIS — R197 Diarrhea, unspecified: Secondary | ICD-10-CM

## 2019-11-05 DIAGNOSIS — K2920 Alcoholic gastritis without bleeding: Secondary | ICD-10-CM

## 2019-11-05 DIAGNOSIS — K860 Alcohol-induced chronic pancreatitis: Secondary | ICD-10-CM

## 2019-11-05 DIAGNOSIS — R634 Abnormal weight loss: Secondary | ICD-10-CM

## 2019-11-05 MED ORDER — OXYCODONE-ACETAMINOPHEN 7.5-325 MG PO TABS: 1.0000 | ORAL_TABLET | Freq: Four times a day (QID) | ORAL | 0 refills | Status: DC | PRN

## 2019-11-05 MED ORDER — PANTOPRAZOLE SODIUM 40 MG PO TBEC: 40.0000 mg | DELAYED_RELEASE_TABLET | Freq: Two times a day (BID) | ORAL | 3 refills | Status: DC

## 2019-11-05 MED ORDER — PANCRELIPASE (LIP-PROT-AMYL) 36000-114000 UNITS PO CPEP: 72000.0000 [IU] | ORAL_CAPSULE | Freq: Three times a day (TID) | ORAL | 5 refills | Status: DC

## 2019-11-05 MED ORDER — SUCRALFATE 1 G PO TABS: 1.0000 g | ORAL_TABLET | Freq: Three times a day (TID) | ORAL | 3 refills | Status: DC

## 2019-11-05 MED ORDER — PLENVU 140 G PO SOLR: ORAL | 0 refills | Status: DC

## 2019-11-05 NOTE — Patient Instructions (Addendum)
If you are age 50 or older, your body mass index should be between 23-30. Your Body mass index is 17.39 kg/m. If this is out of the aforementioned range listed, please consider follow up with your Primary Care Provider.  If you are age 40 or younger, your body mass index should be between 19-25. Your Body mass index is 17.39 kg/m. If this is out of the aformentioned range listed, please consider follow up with your Primary Care Provider.   You have been scheduled for an endoscopy/colonoscopy. Please follow written instructions given to you at your visit today.  Please pick up your prep supplies at the pharmacy within the next 1-3 days. If you use inhalers (even only as needed), please bring them with you on the day of your procedure.  We are giving you a Plenvu sample for your prep.  We will refer [facility] to [facility] for help with alcohol and tobacco use. You should receive a call to schedule an appointment.  We will try to find a Pain Management Clinic that accepts your insurance and refer you. You will receive a call to schedule an appointment.  We have sent the following medications to your pharmacy for you to pick up at your convenience: Creon 36,000 unit: Take 2 capsules with each meal  Protonix 40mg : Take twice a day  Carafate: Take 4 times a day  Percocet: 5 tablets    Thank you for entrusting me with your care and for choosing [facility], Dr. Tokeland Cellar

## 2019-11-05 NOTE — Telephone Encounter (Signed)
Patient currently does not have insurance but has applied for Medicaid.  Called patient and asked him to call me when he gets Medicaid and give [place] his insurance card information.  Then I can refer him to Pain Management - [facility] in [place]. He expressed understanding and said he will call and let me know.

## 2019-11-05 NOTE — Telephone Encounter (Signed)
Great thanks Jan 

## 2019-11-05 NOTE — Progress Notes (Signed)
HPI :  49 y/o male with a history of alcohol abuse and history of acute and chronic pancreatitis, peptic ulcer disease ,referred by Antony Blackbird, MD for abdominal pain and altered bowel habits.  The patient has a longstanding history of alcohol use and abuse.  He states he is not had alcohol for the past month with the exception of 2 beers on Father's Day, but prior to that has been drinking daily anywhere from a 6 pack to a 12 pack of beer today.  He smokes about a half a pack cigarettes a day and uses marijuana occasionally.  Previously has used cocaine but not recently.  He has had multiple episodes of pancreatitis in the past reportedly due to alcohol.  He had a CT scan on April 1 which is his last imaging study which showed evidence of chronic pancreatitis with marked beaded dilation of the pancreatic duct up to 3.2 cm worsened since his last imaging which was in July 2020.  He has persistent discomfort in his epigastric area pretty much most of the time.  Can be worse with eating, has been causing him sleeping problems recently and hurts at night.  He states he has had the symptoms a very long time.  He is trying not to use Tylenol or any NSAIDs.  He has been using Creon 36,000 units a day with each meal, Carafate as needed as well as Protonix twice daily.  He is also been given Percocet by his primary care and is asking for a refill of that today.  Over the past 3 weeks his symptoms have been much worse, he states he is getting by on mostly liquids and bland foods, he is lost 18 pounds in the past 3 weeks, states his normal weight is about 132 pounds, currently 113 at home.  Dating back on review of his chart he has had numerous CT scans performed over the years, he has had pancreatitis dating back to 2010 at least.  He had an MRCP in 2016.  No pancreatic mass lesions noted, pseudocysts have been appreciated on prior exams.  His LFTs and lipase levels have been normal.  He does have a chronic  normocytic anemia.  He also endorses chronic loose stools about 5-6 times per day, Imodium is not helping.  This been going on for about the past 3 weeks, normally he does not have any diarrhea.  He had amoxicillin about a month ago for a dental abscess.  He denies any fevers.  He did have a negative GI pathogen panel and C. difficile testing on 14 June.  He denies any family history of pancreatic cancer, his grandfather had pancreatitis.  He denies any family history of colon cancer.  He has never had a colonoscopy.  He was previously seen by [facility] but was dismissed from their practice. He has had a few EGDs in the past, previously showing gastric and duodenal ulcerations, his last upper endoscopy was in July 2020 which showed interval healing of his ulcers on PPI.  He endorses compliance with his Protonix use.  He states he feels like this is his pancreas causing his symptoms although he is also concerned about recurrent ulcer.  He had an echocardiogram in April which showed a normal EF.   CT scan 08/08/19 - IMPRESSION: 1. Clear lung bases. 2. No evidence of bowel obstruction. 3. Scattered fluid levels in the small and large bowel, suggesting ileus / malabsorptive state. No bowel wall thickening. 4. Chronic pancreatitis.  Marked beaded dilatation of the main pancreatic duct in the pancreatic body and tail up to 3.2cm, mildly worsened since earlier noncontrast CT study. No evidence of superimposed acute pancreatitis. 5. Aortic Atherosclerosis (ICD10-I70.0).   EGD 11/14/18 - normal exam  EGD 10/14/18 - The examined esophagus was normal. No MW-Tear or mass or varices or esophagitis noted. Findings: One non-bleeding superficial gastric ulcer with no stigmata of bleeding was found in the prepyloric region of the stomach. The lesion was 3 mm in largest dimension. One non-bleeding superficial gastric ulcer with no stigmata of bleeding was found at the pylorus. The lesion was 5 mm in largest  dimension. The exam of the stomach was otherwise normal. Patchy moderate mucosal changes characterized by ulceration were found in the duodenal bulb, in the first portion of the duodenum and in the second portion of the duodenum. Appearance similar to, but worse, than that in July 2019. Biopsies were taken with a cold forceps for histology.  EGD 11/25/2017 - The examined esophagus was normal. Findings: Patchy mild inflammation was found in the gastric body and in the gastric antrum. Biopsies were taken with a cold forceps for histology. A few localized small erosions were found in the prepyloric region of the stomach. Couple small cleanbased pyloric channel ulcers were seen. The exam of the stomach was otherwise normal. No evidence of gastric outlet obstruction. Two non-bleeding cratered duodenal ulcers with pigmented material were found in the duodenal bulb. The largest lesion was 8 mm in largest dimension. Diffuse mildly congested mucosa without active bleeding and with no stigmata of bleeding was found in the duodenal bulb and in the first portion of the duodenum.   Echo 08/26/19 - EF 60-65%  Past Medical History:  Diagnosis Date  . Alcoholism /alcohol abuse ([place])   . Anemia   . Anxiety   . Arthritis    "knees; arms; elbows" (03/26/2015)  . Asthma   . Bipolar disorder ([place])   . Chronic bronchitis ([place])   . Chronic lower back pain   . Chronic pancreatitis ([place])   . Cocaine abuse ([place])   . Depression   . Family history of adverse reaction to anesthesia    "grandmother gets confused"  . Femoral condyle fracture ([place]) 03/08/2014   left medial/notes 03/09/2014  . GERD (gastroesophageal reflux disease)   . H/O hiatal hernia   . H/O suicide attempt 10/2012  . High cholesterol   . History of blood transfusion 10/2012   "when I tried to commit suicide"  . History of stomach ulcers   . Hypertension   . Marijuana abuse, continuous   . Migraine    "a few times/year" (03/26/2015)    . Pneumonia 1990's X 3  . PTSD (post-traumatic stress disorder)   . Sickle cell trait ([place])   . WPW (Wolff-Parkinson-White syndrome)    [name] 03/06/2013     Past Surgical History:  Procedure Laterality Date  . BIOPSY  11/25/2017   Procedure: BIOPSY;  Surgeon: Arta Silence, MD;  Location: [facility];  Service: Endoscopy;;  . BIOPSY  10/14/2018   Procedure: BIOPSY;  Surgeon: Arta Silence, MD;  Location: [place] [facility];  Service: Endoscopy;;  . CARDIAC CATHETERIZATION    . ESOPHAGOGASTRODUODENOSCOPY (EGD) WITH PROPOFOL N/A 11/25/2017   Procedure: ESOPHAGOGASTRODUODENOSCOPY (EGD) WITH PROPOFOL;  Surgeon: Arta Silence, MD;  Location: [place];  Service: Endoscopy;  Laterality: N/A;  . ESOPHAGOGASTRODUODENOSCOPY (EGD) WITH PROPOFOL Left 10/14/2018   Procedure: ESOPHAGOGASTRODUODENOSCOPY (EGD) WITH PROPOFOL;  Surgeon: Arta Silence, MD;  Location[facility]  ENDOSCOPY;  Service: Endoscopy;  Laterality: Left;  . ESOPHAGOGASTRODUODENOSCOPY (EGD) WITH PROPOFOL N/A 11/14/2018   Procedure: ESOPHAGOGASTRODUODENOSCOPY (EGD) WITH PROPOFOL;  Surgeon: Laurence Spates, MD;  Location: [facility] ENDOSCOPY;  Service: Gastroenterology;  Laterality: N/A;  . EYE SURGERY Left 1990's   "result of trauma"   . FACIAL FRACTURE SURGERY Left 1990's   "result of trauma"   . FRACTURE SURGERY    . HERNIA REPAIR    . LEFT HEART CATHETERIZATION WITH CORONARY ANGIOGRAM Right 03/07/2013   Procedure: LEFT HEART CATHETERIZATION WITH CORONARY ANGIOGRAM;  Surgeon: [name], MD;  Location: [facility] CATH [facility];  Service: Cardiovascular;  Laterality: Right;  . UMBILICAL HERNIA REPAIR     Family History  Problem Relation Age of Onset  . Hypertension Mother   . Cirrhosis Mother   . Alcoholism Mother   . Hypertension Father   . Melanoma Father   . Hypertension Other   . Coronary artery disease Other    Social History   Tobacco Use  . Smoking status: Current Every Day Smoker    Packs/day: 1.00    Years: 33.00    Pack years:  33.00    Types: Cigarettes, E-cigarettes  . Smokeless tobacco: Never Used  Vaping Use  . Vaping Use: Some days  Substance Use Topics  . Alcohol use: Yes  . Drug use: Yes    Types: Marijuana, Cocaine    Comment: daily marijuana use; last cocaine use about 3 months ago   Current Outpatient Medications  Medication Sig Dispense Refill  . acetaminophen (TYLENOL) 500 MG tablet Take 2 tablets (1,000 mg total) by mouth every 8 (eight) hours as needed for mild pain. 30 tablet 0  . chlordiazePOXIDE (LIBRIUM) 10 MG capsule Take 1 capsule (10 mg total) by mouth 3 (three) times daily as needed for anxiety or withdrawal. 30 capsule 0  . Cyanocobalamin (VITAMIN B-12 PO) Take 1 tablet by mouth daily.    . cyclobenzaprine (FLEXERIL) 10 MG tablet Take 1 tablet (10 mg total) by mouth 2 (two) times daily as needed for muscle spasms. 20 tablet 0  . famotidine (PEPCID) 20 MG tablet Take 1 tablet (20 mg total) by mouth 2 (two) times daily. 30 tablet 0  . folic acid (FOLVITE) 1 MG tablet Take 1 tablet (1 mg total) by mouth daily. 30 tablet 0  . hydrALAZINE (APRESOLINE) 25 MG tablet Take 1 tablet (25 mg total) by mouth 3 (three) times daily. 90 tablet 4  . hydrOXYzine (VISTARIL) 25 MG capsule Take 25 mg by mouth 3 (three) times daily as needed for anxiety.     . lidocaine (XYLOCAINE) 2 % solution Use as directed 15 mLs in the mouth or throat as needed for mouth pain. 150 mL 0  . lipase/protease/amylase (CREON) 12000 units CPEP capsule Take 3 capsules (36,000 Units total) by mouth 3 (three) times daily with meals. For pancreatitis 270 capsule 3  . loperamide (IMODIUM A-D) 2 MG tablet Take 2 mg by mouth 4 (four) times daily as needed for diarrhea or loose stools.    [name] loratadine (CLARITIN) 10 MG tablet Take 1 tablet (10 mg total) by mouth daily as needed for allergies or rhinitis. (May purchase from over the counter): For allergies (Patient taking differently: Take 10 mg by mouth daily. (May purchase from over the  counter): For allergies)    . LORazepam (ATIVAN) 0.5 MG tablet Take 0.5 mg by mouth every 8 (eight) hours as needed for anxiety.    . metoprolol tartrate (LOPRESSOR)  100 MG tablet Take 1 tablet (100 mg total) by mouth 2 (two) times daily. To lower blood pressure 60 tablet 4  . Multiple Vitamin (MULTIVITAMIN WITH MINERALS) TABS tablet Take 1 tablet by mouth daily. 30 tablet 0  . NASONEX 50 MCG/ACT nasal spray Place 2 sprays into the nose daily.    . ondansetron (ZOFRAN ODT) 4 MG disintegrating tablet Take 1 tablet (4 mg total) by mouth every 8 (eight) hours as needed for nausea or vomiting. 20 tablet 0  . oxyCODONE-acetaminophen (PERCOCET) 7.5-325 MG tablet Take 1 tablet by mouth every 6 (six) hours as needed for severe pain.    . pantoprazole (PROTONIX) 40 MG tablet Take 1 tablet (40 mg total) by mouth 2 (two) times daily. 60 tablet 11  . potassium chloride SA (K-DUR) 20 MEQ tablet Take 1 tablet (20 mEq total) by mouth daily. (Patient taking differently: Take 20 mEq by mouth daily as needed (potassium). ) 3 tablet 0  . QUEtiapine (SEROQUEL) 50 MG tablet Take 50 mg by mouth at bedtime.    . sertraline (ZOLOFT) 100 MG tablet Take 1 tablet (100 mg total) by mouth daily. (Patient taking differently: Take 150 mg by mouth daily. ) 30 tablet 3  . sucralfate (CARAFATE) 1 g tablet Take 1 tablet (1 g total) by mouth 4 (four) times daily -  with meals and at bedtime. 120 tablet 1  . thiamine 100 MG tablet Take 1 tablet (100 mg total) by mouth daily. 30 tablet 0   No current facility-administered medications for this visit.   Allergies  Allergen Reactions  . Robaxin [Methocarbamol] Other (See Comments)    "jumpy limbs"  . Shellfish-Derived Products Nausea And Vomiting  . Trazodone Other (See Comments)    Muscle spasms  . Trazodone And Nefazodone Other (See Comments)    Muscle spasms  . Adhesive [Tape] Itching  . Latex Itching  . Toradol [Ketorolac Tromethamine] Other (See Comments)    Has ulcers;  cannot have this  . Contrast Media [Iodinated Diagnostic Agents] Hives  . Reglan [Metoclopramide] Other (See Comments)    Muscle spasms     Review of Systems: All systems reviewed and negative except where noted in HPI.    DG Chest 2 View  Result Date: 10/06/2019 CLINICAL DATA:  Chest pain EXAM: CHEST - 2 VIEW COMPARISON:  09/28/2019 chest radiograph. FINDINGS: Stable cardiomediastinal silhouette with normal heart size. No pneumothorax. No pleural effusion. Lungs appear clear, with no acute consolidative airspace disease and no pulmonary edema. IMPRESSION: No active cardiopulmonary disease. Electronically Signed   By: Ilona Sorrel M.D.   On: 10/06/2019 10:43    Lab Results  Component Value Date   ALT 29 10/20/2019   AST 34 10/20/2019   ALKPHOS 91 10/20/2019   BILITOT 0.5 10/20/2019    Lab Results  Component Value Date   LIPASE 22 10/20/2019    Lab Results  Component Value Date   WBC 8.9 10/20/2019   HGB 10.3 (L) 10/20/2019   HCT 32.1 (L) 10/20/2019   MCV 87.0 10/20/2019   PLT 132 (L) 10/20/2019    CBC Latest Ref Rng & Units 10/20/2019 10/06/2019 09/28/2019  WBC 4.0 - 10.5 K/uL 8.9 8.4 -  Hemoglobin 13.0 - 17.0 g/dL 10.3(L) 10.2(L) 14.6  Hematocrit 39 - 52 % 32.1(L) 32.1(L) 43.0  Platelets 150 - 400 K/uL 132(L) 292 -    Physical Exam: BP 128/74 (BP Location: Left Arm, Patient Position: Sitting, Cuff Size: Normal)   Ht 5\' 8"  (1.727  m)   Wt 114 lb 6 oz (51.9 kg)   BMI 17.39 kg/m  Constitutional: Pleasant, thin, male in no acute distress. HEENT: Normocephalic and atraumatic. Conjunctivae are normal. No scleral icterus. Neck supple.  Cardiovascular: Normal rate, regular rhythm.  Pulmonary/chest: Effort normal and breath sounds normal. No wheezing, rales or rhonchi. Abdominal: Soft, nondistended, epigastric tenderness to palpation . There are no masses palpable. Extremities: no edema Lymphadenopathy: No cervical adenopathy noted. Neurological: Alert and oriented to  person place and time. Skin: Skin is warm and dry. No rashes noted. Psychiatric: Normal mood and affect. Behavior is normal.   ASSESSMENT AND PLAN: 49 year old male here for new patient assessment of the following:  Chronic pancreatitis / weight loss / history of peptic ulcer - history of alcohol abuse, he reports abstinence over the past month other than Father's Day.  He has had numerous CT scans over the past 10 years showing acute and chronic pancreatitis, history of pseudocysts, he most recently has had progressive dilation of his pancreatic duct over 3 cm.  I suspect this is related to ongoing alcohol use and tobacco use and discussed that he must abstain from both of these to reduce his risk of further progression of this, as he is now left with chronic pain.  I will refer him to behavioral health for both alcohol and tobacco use and he was agreeable to that to help with cessation.  He is taking Creon 36,000 units per meal and will recommend increasing that dose to see if that helps at all.  Ultimately given his imaging abnormalities I think he may warrant an EUS or MRCP at this point.  I will discuss his case with [facility] to see if they would be willing to pursue EUS now or would rather have MRCP first.  I will contact him once we hear back to schedule one of these.  I otherwise offered him an endoscopy to rule out recurrent peptic ulcer disease although I think that may be less likely at this point given he is compliant with PPI and Carafate, he will continue that for now and avoid all NSAIDs.  He is asking about chronic narcotics and I counseled him that I would try to avoid that if possible and I would not prescribe that chronically for him.  I will give him a refill of 5 tablets to use for extreme pain, I otherwise offered him a pain management referral which he is willing to pursue and understands that he must see them if he wishes to have chronic narcotic to manage this.  I will get  back to him regarding EUS versus MRCP in the upcoming few days.  He agreed  Change in bowel habits / colon cancer screening - persistent loose stools over several weeks, will see how he responds to higher dose Carafate but Imodium not helping too much and negative for infectious work-up.  He is due for routine colon cancer screening and recommend optical colonoscopy to further evaluate.  We will add on EGD to this exam to exclude peptic ulcer disease as a cause for her symptoms although as above I think less likely the culprit at this time.  He is tentatively scheduled for these exams next week, discussed risk and benefits he wants to proceed.  Recent echo looks okay.  Butlerville Cellar, MD [facility]  CC: Antony Blackbird, MD

## 2019-11-06 ENCOUNTER — Telehealth: Payer: Self-pay | Admitting: Gastroenterology

## 2019-11-06 DIAGNOSIS — R634 Abnormal weight loss: Secondary | ICD-10-CM

## 2019-11-06 DIAGNOSIS — Z8711 Personal history of peptic ulcer disease: Secondary | ICD-10-CM

## 2019-11-06 DIAGNOSIS — R933 Abnormal findings on diagnostic imaging of other parts of digestive tract: Secondary | ICD-10-CM

## 2019-11-06 NOTE — Telephone Encounter (Signed)
Discussed his case with [facility], they recommend MRI / MRCP initially to evaluate his abnormal CT, and then consideration for EUS based on that result.  Barbera Setters can you help coordinate MRCP with contrast for this patient and let him know? Thanks

## 2019-11-07 NOTE — Addendum Note (Signed)
Addended by: Marlon Pel on: 11/07/2019 04:34 PM   Modules accepted: Orders

## 2019-11-08 ENCOUNTER — Ambulatory Visit (INDEPENDENT_AMBULATORY_CARE_PROVIDER_SITE_OTHER): Payer: Self-pay

## 2019-11-08 DIAGNOSIS — Z1159 Encounter for screening for other viral diseases: Secondary | ICD-10-CM

## 2019-11-08 NOTE — Addendum Note (Signed)
Addended by: Marlon Pel on: 11/08/2019 09:52 AM   Modules accepted: Orders

## 2019-11-08 NOTE — Telephone Encounter (Signed)
Patient notified of the recommendations He is scheduled for 11/22/19 at [facility] radiology.  He is asked to arrive at 7:30 and be NPO after midnight

## 2019-11-09 LAB — SARS CORONAVIRUS 2 (TAT 6-24 HRS): SARS Coronavirus 2: NEGATIVE

## 2019-11-12 ENCOUNTER — Encounter: Payer: Self-pay | Admitting: Critical Care Medicine

## 2019-11-12 ENCOUNTER — Encounter: Payer: Self-pay | Admitting: Gastroenterology

## 2019-11-12 ENCOUNTER — Other Ambulatory Visit: Payer: Self-pay

## 2019-11-12 ENCOUNTER — Other Ambulatory Visit: Payer: Self-pay | Admitting: Family Medicine

## 2019-11-12 ENCOUNTER — Telehealth: Payer: Self-pay | Admitting: Critical Care Medicine

## 2019-11-12 ENCOUNTER — Ambulatory Visit (INDEPENDENT_AMBULATORY_CARE_PROVIDER_SITE_OTHER): Payer: Self-pay | Admitting: Critical Care Medicine

## 2019-11-12 VITALS — BP 138/80 | HR 93 | Ht 69.0 in | Wt 112.2 lb

## 2019-11-12 DIAGNOSIS — F17200 Nicotine dependence, unspecified, uncomplicated: Secondary | ICD-10-CM

## 2019-11-12 DIAGNOSIS — J4540 Moderate persistent asthma, uncomplicated: Secondary | ICD-10-CM

## 2019-11-12 MED ORDER — BREO ELLIPTA 200-25 MCG/INH IN AEPB: 1.0000 | INHALATION_SPRAY | Freq: Every day | RESPIRATORY_TRACT | 11 refills | Status: DC

## 2019-11-12 MED ORDER — ALBUTEROL SULFATE HFA 108 (90 BASE) MCG/ACT IN AERS
2.0000 | INHALATION_SPRAY | RESPIRATORY_TRACT | 11 refills | Status: DC | PRN
Start: 2019-11-12 — End: 2021-08-17

## 2019-11-12 MED ORDER — BREO ELLIPTA 200-25 MCG/INH IN AEPB
1.0000 | INHALATION_SPRAY | Freq: Every day | RESPIRATORY_TRACT | 0 refills | Status: DC
Start: 2019-11-12 — End: 2020-03-10

## 2019-11-12 MED FILL — !BREO ELLIPTA 200-25 MCG: 200-25 | 30 days supply | Qty: 30 | Fill #0

## 2019-11-12 MED FILL — !VENTOLIN HFA INHALER: 108 (90 BAS | 16 days supply | Qty: 18 | Fill #0

## 2019-11-12 NOTE — Telephone Encounter (Signed)
Called and spoke with pt in regards to [facility] calling him. Pt stated Mandi wanted to know if he had any income coming in and he stated that he did not.  Stated to him that I was going to route this to [facility] for her to follow up on in case there was more info that I was missing. Routing to [place].

## 2019-11-12 NOTE — Patient Instructions (Addendum)
Thank you for visiting Dr. Carlis Abbott at [facility]. We recommend the following: Orders Placed This Encounter  Procedures   Ambulatory Referral for Lung Cancer Scre   Pulmonary function test   Orders Placed This Encounter  Procedures   Ambulatory Referral for Lung Cancer Scre    Referral Priority:   Routine    Referral Type:   Consultation    Referral Reason:   Specialty Services Required    Number of Visits Requested:   1   Pulmonary function test    Standing Status:   Future    Standing Expiration Date:   11/11/2020    Order Specific Question:   Where should this test be performed?    Answer:   [facility]    Order Specific Question:   Full PFT: includes the following: basic spirometry, spirometry pre & post bronchodilator, diffusion capacity (DLCO), lung volumes    Answer:   Full PFT   Keep taking zyrtec daily.  Meds ordered this encounter  Medications   albuterol (VENTOLIN HFA) 108 (90 Base) MCG/ACT inhaler    Sig: Inhale 2 puffs into the lungs every 4 (four) hours as needed for wheezing or shortness of breath.    Dispense:  18 g    Refill:  11   fluticasone furoate-vilanterol (BREO ELLIPTA) 200-25 MCG/INH AEPB    Sig: Inhale 1 puff into the lungs daily.    Dispense:  1 each    Refill:  11    Return in about 2 months (around 01/13/2020).    Please do your part to reduce the spread of COVID-19.  It is very important that you stop smoking or vaping. This is the single most important thing that you can do to improve your lung health.   S = Set a quit date. T = Tell family, friends, and the people around you that you plan to quit. A = Anticipate or plan ahead for the tough times you'll face while quitting. R = Remove cigarettes and other tobacco products from your home, car, and work. T = Talk to [place] about getting help to quit.  If you need help, please reach out to our office or the smoking cessation resources available: [place] [facility]  Cessation Class: 767-209-4709 1-800-QUIT-NOW www.BeTobaccoFree.gov

## 2019-11-12 NOTE — Telephone Encounter (Signed)
Pt called [name] back, please return call

## 2019-11-12 NOTE — Progress Notes (Signed)
Synopsis: Referred in July 2021 for asthma by Antony Blackbird, MD.  Subjective:   PATIENT ID: [name] GENDER: male DOB: 1970/08/07, MRN: [id number]  Chief Complaint  Patient presents with  . Consult    Patient has shortness of breath all the time and is worse with exertion.  Dry cough  Uses rescue inhaler about twice a day    [name] is a 49 year old gentleman with a history of tobacco abuse, asthma, chronic pancreatitis, PUD, who presents to establish care for asthma.  He was diagnosed with asthma as an infant and was hospitalized frequently as a child.  He was on maintenance inhalers and allergy shots as a child.  He has been off allergy shots since around age 6.  He takes Claritin or Zyrtec for allergies.  At times he takes Sudafed for nasal congestion.  He is not currently on maintenance inhalers and uses albuterol about twice per day on an as-needed basis.  He has a dry cough, dyspnea on exertion limits his activity, wheezing, nocturnal symptoms that wake him up.  He can only walk about a block before stopping to rest.  Some days he struggles to walk around his house.  His symptoms are worse around dust.  He is allergic to dust, cats, dogs, and mildew.  He has a Programmer, systems, but his pet does not sleep in his room.  He smokes 0.5 packs/day, down from his heaviest to 2 packs/day.  He has been using smokeless tobacco to try and cut back, and is trying to quit smoking in chewing tobacco entirely.  He tried vaping in the past, but no longer does this.  He sometimes uses nicotine patches.  He has applied for medicaid and disability in the past, but was recently informed that his request was denied.  He is currently cash pay for all medications and procedures.  He has a colonoscopy and EGD scheduled tomorrow.  He does not have a nebulizer machine at home.     Past Medical History:  Diagnosis Date  . Alcoholism /alcohol abuse ([place])   . Anemia   . Anxiety   . Arthritis    "knees;  arms; elbows" (03/26/2015)  . Asthma   . Bipolar disorder ([place])   . Chronic bronchitis ([place])   . Chronic lower back pain   . Chronic pancreatitis ([place])   . Cocaine abuse ([place])   . Depression   . Family history of adverse reaction to anesthesia    "grandmother gets confused"  . Femoral condyle fracture ([place]) 03/08/2014   left medial/notes 03/09/2014  . GERD (gastroesophageal reflux disease)   . H/O hiatal hernia   . H/O suicide attempt 10/2012  . High cholesterol   . History of blood transfusion 10/2012   "when I tried to commit suicide"  . History of stomach ulcers   . Hypertension   . Marijuana abuse, continuous   . Migraine    "a few times/year" (03/26/2015)  . Pneumonia 1990's X 3  . PTSD (post-traumatic stress disorder)   . Sickle cell trait ([place])   . WPW (Wolff-Parkinson-White syndrome)    [name] 03/06/2013     Family History  Problem Relation Age of Onset  . Hypertension Mother   . Cirrhosis Mother   . Alcoholism Mother   . Hypertension Father   . Melanoma Father   . Hypertension Other   . Coronary artery disease Other      Past Surgical History:  Procedure Laterality Date  .  BIOPSY  11/25/2017   Procedure: BIOPSY;  Surgeon: Arta Silence, MD;  Location: [facility] ENDOSCOPY;  Service: Endoscopy;;  . BIOPSY  10/14/2018   Procedure: BIOPSY;  Surgeon: Arta Silence, MD;  Location: [facility];  Service: Endoscopy;;  . CARDIAC CATHETERIZATION    . ESOPHAGOGASTRODUODENOSCOPY (EGD) WITH PROPOFOL N/A 11/25/2017   Procedure: ESOPHAGOGASTRODUODENOSCOPY (EGD) WITH PROPOFOL;  Surgeon: Arta Silence, MD;  Location: [facility];  Service: Endoscopy;  Laterality: N/A;  . ESOPHAGOGASTRODUODENOSCOPY (EGD) WITH PROPOFOL Left 10/14/2018   Procedure: ESOPHAGOGASTRODUODENOSCOPY (EGD) WITH PROPOFOL;  Surgeon: Arta Silence, MD;  Location: [facility] ENDOSCOPY;  Service: Endoscopy;  Laterality: Left;  . ESOPHAGOGASTRODUODENOSCOPY (EGD) WITH PROPOFOL N/A 11/14/2018   Procedure:  ESOPHAGOGASTRODUODENOSCOPY (EGD) WITH PROPOFOL;  Surgeon: Laurence Spates, MD;  Location: [facility] ENDOSCOPY;  Service: Gastroenterology;  Laterality: N/A;  . EYE SURGERY Left 1990's   "result of trauma"   . FACIAL FRACTURE SURGERY Left 1990's   "result of trauma"   . FRACTURE SURGERY    . HERNIA REPAIR    . LEFT HEART CATHETERIZATION WITH CORONARY ANGIOGRAM Right 03/07/2013   Procedure: LEFT HEART CATHETERIZATION WITH CORONARY ANGIOGRAM;  Surgeon: [name], MD;  Location: [facility] CATH LAB;  Service: Cardiovascular;  Laterality: Right;  . UMBILICAL HERNIA REPAIR      Social History   Socioeconomic History  . Marital status: Single    Spouse name: Not on file  . Number of children: Not on file  . Years of education: Not on file  . Highest education level: Not on file  Occupational History  . Occupation: disabled  Tobacco Use  . Smoking status: Current Every Day Smoker    Packs/day: 1.00    Years: 33.00    Pack years: 33.00    Types: Cigarettes, E-cigarettes  . Smokeless tobacco: Never Used  Vaping Use  . Vaping Use: Former  Substance and Sexual Activity  . Alcohol use: Yes  . Drug use: Yes    Types: Marijuana, Cocaine    Comment: daily marijuana use; last cocaine use about 3 months ago  . Sexual activity: Yes    Birth control/protection: None  Other Topics Concern  . Not on file  Social History Narrative   ** Merged History Encounter **       Social Determinants of Health   Financial Resource Strain:   . Difficulty of Paying Living Expenses:   Food Insecurity:   . Worried About Charity fundraiser in the Last Year:   . Arboriculturist in the Last Year:   Transportation Needs:   . Film/video editor (Medical):   [name] Lack of Transportation (Non-Medical):   Physical Activity:   . Days of Exercise per Week:   . Minutes of Exercise per Session:   Stress:   . Feeling of Stress :   Social Connections:   . Frequency of Communication with Friends and Family:   .  Frequency of Social Gatherings with Friends and Family:   . Attends Religious Services:   . Active Member of Clubs or Organizations:   . Attends Archivist Meetings:   [name] Marital Status:   Intimate Partner Violence:   . Fear of Current or Ex-Partner:   . Emotionally Abused:   [name] Physically Abused:   . Sexually Abused:      Allergies  Allergen Reactions  . Robaxin [Methocarbamol] Other (See Comments)    "jumpy limbs"  . Shellfish-Derived Products Nausea And Vomiting  . Trazodone Other (See Comments)  Muscle spasms  . Trazodone And Nefazodone Other (See Comments)    Muscle spasms  . Adhesive [Tape] Itching  . Latex Itching  . Toradol [Ketorolac Tromethamine] Other (See Comments)    Has ulcers; cannot have this  . Contrast Media [Iodinated Diagnostic Agents] Hives  . Reglan [Metoclopramide] Other (See Comments)    Muscle spasms     Immunization History  Administered Date(s) Administered  . Influenza Whole 03/23/2007  . Influenza,inj,Quad PF,6+ Mos 01/31/2014, 02/03/2015  . PPD Test 10/03/2011  . Pneumococcal Polysaccharide-23 03/23/2007, 12/27/2013  . Tdap 01/22/2013    Outpatient Medications Prior to Visit  Medication Sig Dispense Refill  . acetaminophen (TYLENOL) 500 MG tablet Take 2 tablets (1,000 mg total) by mouth every 8 (eight) hours as needed for mild pain. 30 tablet 0  . chlordiazePOXIDE (LIBRIUM) 10 MG capsule Take 1 capsule (10 mg total) by mouth 3 (three) times daily as needed for anxiety or withdrawal. 30 capsule 0  . Cyanocobalamin (VITAMIN B-12 PO) Take 1 tablet by mouth daily.    . cyclobenzaprine (FLEXERIL) 10 MG tablet Take 1 tablet (10 mg total) by mouth 2 (two) times daily as needed for muscle spasms. 20 tablet 0  . famotidine (PEPCID) 20 MG tablet Take 1 tablet (20 mg total) by mouth 2 (two) times daily. 30 tablet 0  . folic acid (FOLVITE) 1 MG tablet Take 1 tablet (1 mg total) by mouth daily. 30 tablet 0  . hydrALAZINE (APRESOLINE) 25 MG  tablet Take 1 tablet (25 mg total) by mouth 3 (three) times daily. 90 tablet 4  . hydrOXYzine (VISTARIL) 25 MG capsule Take 25 mg by mouth 3 (three) times daily as needed for anxiety.     . lidocaine (XYLOCAINE) 2 % solution Use as directed 15 mLs in the mouth or throat as needed for mouth pain. 150 mL 0  . lipase/protease/amylase (CREON) 36000 UNITS CPEP capsule Take 2 capsules (72,000 Units total) by mouth 3 (three) times daily with meals. For pancreatitis 180 capsule 5  . loperamide (IMODIUM A-D) 2 MG tablet Take 2 mg by mouth 4 (four) times daily as needed for diarrhea or loose stools.    [name] loratadine (CLARITIN) 10 MG tablet Take 1 tablet (10 mg total) by mouth daily as needed for allergies or rhinitis. (May purchase from over the counter): For allergies (Patient taking differently: Take 10 mg by mouth daily. (May purchase from over the counter): For allergies)    . LORazepam (ATIVAN) 0.5 MG tablet Take 0.5 mg by mouth every 8 (eight) hours as needed for anxiety.    . metoprolol tartrate (LOPRESSOR) 100 MG tablet Take 1 tablet (100 mg total) by mouth 2 (two) times daily. To lower blood pressure 60 tablet 4  . Multiple Vitamin (MULTIVITAMIN WITH MINERALS) TABS tablet Take 1 tablet by mouth daily. 30 tablet 0  . NASONEX 50 MCG/ACT nasal spray Place 2 sprays into the nose daily.    . ondansetron (ZOFRAN ODT) 4 MG disintegrating tablet Take 1 tablet (4 mg total) by mouth every 8 (eight) hours as needed for nausea or vomiting. 20 tablet 0  . oxyCODONE-acetaminophen (PERCOCET) 7.5-325 MG tablet Take 1 tablet by mouth every 6 (six) hours as needed for severe pain.    [name] oxyCODONE-acetaminophen (PERCOCET) 7.5-325 MG tablet Take 1 tablet by mouth every 6 (six) hours as needed for severe pain. 5 tablet 0  . pantoprazole (PROTONIX) 40 MG tablet Take 1 tablet (40 mg total) by mouth 2 (two) times daily.  60 tablet 3  . PEG-KCl-NaCl-NaSulf-Na Asc-C (PLENVU) 140 g SOLR Lot: [id number], Exp: 05-2020 1 each 0  . potassium  chloride SA (K-DUR) 20 MEQ tablet Take 1 tablet (20 mEq total) by mouth daily. (Patient taking differently: Take 20 mEq by mouth daily as needed (potassium). ) 3 tablet 0  . QUEtiapine (SEROQUEL) 50 MG tablet Take 50 mg by mouth at bedtime.    . sertraline (ZOLOFT) 100 MG tablet Take 1 tablet (100 mg total) by mouth daily. (Patient taking differently: Take 150 mg by mouth daily. ) 30 tablet 3  . sucralfate (CARAFATE) 1 g tablet Take 1 tablet (1 g total) by mouth 4 (four) times daily -  with meals and at bedtime. 120 tablet 3  . thiamine 100 MG tablet Take 1 tablet (100 mg total) by mouth daily. 30 tablet 0   No facility-administered medications prior to visit.    Review of Systems  Constitutional: Positive for weight loss. Negative for chills, diaphoresis and fever.  HENT: Negative for congestion.   Respiratory: Positive for cough, sputum production, shortness of breath and wheezing.   Cardiovascular: Positive for chest pain. Negative for leg swelling.  Gastrointestinal: Positive for abdominal pain, heartburn, melena, nausea and vomiting.  Genitourinary: Negative for hematuria.  Endo/Heme/Allergies: Positive for environmental allergies.     Objective:   Vitals:   11/12/19 0939  BP: 138/80  Pulse: 93  SpO2: 100%  Weight: 112 lb 3.2 oz (50.9 kg)  Height: 5\' 9"  (1.753 m)   100% on   RA BMI Readings from Last 3 Encounters:  11/12/19 16.57 kg/m  11/05/19 17.39 kg/m  10/20/19 19.44 kg/m   Wt Readings from Last 3 Encounters:  11/12/19 112 lb 3.2 oz (50.9 kg)  11/05/19 114 lb 6 oz (51.9 kg)  09/28/19 127 lb 13.9 oz (58 kg)    Physical Exam Vitals reviewed.  Constitutional:      Comments: Thin, chronically ill-appearing man in no acute distress  HENT:     Head: Atraumatic.     Comments: Temporal wasting Eyes:     General: No scleral icterus. Cardiovascular:     Rate and Rhythm: Normal rate and regular rhythm.     Heart sounds: No murmur heard.   Pulmonary:      Comments: Breathing comfortably on room air, no conversational dyspnea.  No observed coughing.  Inspiratory rhonchi; otherwise clear to auscultation bilaterally. Abdominal:     General: There is no distension.     Palpations: Abdomen is soft.  Musculoskeletal:        General: No swelling or deformity.     Cervical back: Neck supple.  Lymphadenopathy:     Cervical: No cervical adenopathy.  Skin:    General: Skin is warm.     Findings: No rash.  Neurological:     General: No focal deficit present.     Mental Status: He is alert.     Coordination: Coordination normal.  Psychiatric:        Mood and Affect: Mood normal.        Behavior: Behavior normal.      CBC    Component Value Date/Time   WBC 8.9 10/20/2019 0005   RBC 3.69 (L) 10/20/2019 0005   HGB 10.3 (L) 10/20/2019 0005   HCT 32.1 (L) 10/20/2019 0005   PLT 132 (L) 10/20/2019 0005   MCV 87.0 10/20/2019 0005   MCH 27.9 10/20/2019 0005   MCHC 32.1 10/20/2019 0005   RDW 17.7 (H) 10/20/2019 0005  LYMPHSABS 1.9 09/19/2019 1231   MONOABS 0.7 09/19/2019 1231   EOSABS 0.3 09/19/2019 1231   BASOSABS 0.0 09/19/2019 1231    CHEMISTRY No results for input(s): NA, K, CL, CO2, GLUCOSE, BUN, CREATININE, CALCIUM, MG, PHOS in the last 168 hours. CrCl cannot be calculated (Patient's most recent lab result is older than the maximum 21 days allowed.).   Chest Imaging- films reviewed: CXR, 2 view 10/06/2019-increased retrosternal airspace.  No opacities.  Pulmonary Functions Testing Results: No flowsheet data found.   Echocardiogram 08/26/2019: LVEF 60 to 95%, grade 1 diastolic dysfunction.  Normal LA, RV, RA.  Normal valves.     Assessment & Plan:     ICD-10-CM   1. TOBACCO ABUSE  F17.200 Ambulatory Referral for Lung Cancer Scre    Pulmonary function test  2. Moderate persistent asthma without complication  G38.75 Ambulatory Referral for Lung Cancer Scre    Pulmonary function test    Moderate persistent asthma; likely  also has COPD from chronic tobacco abuse -PFTs -Start Breo 200 once daily.  Given a sample inhaler and instruction on use.  Patient assistance paperwork completed to be sent to [facility]. -Continue albuterol every 4 hours as needed -Discussed the importance of tobacco cessation, which he agrees.  Congratulated him on his success with quitting so far  History of tobacco use -Counseled on the importance of quitting and congratulated him on his success so far -Referral for lung cancer screening    RTC in 2 months.   Current Outpatient Medications:  .  acetaminophen (TYLENOL) 500 MG tablet, Take 2 tablets (1,000 mg total) by mouth every 8 (eight) hours as needed for mild pain., Disp: 30 tablet, Rfl: 0 .  chlordiazePOXIDE (LIBRIUM) 10 MG capsule, Take 1 capsule (10 mg total) by mouth 3 (three) times daily as needed for anxiety or withdrawal., Disp: 30 capsule, Rfl: 0 .  Cyanocobalamin (VITAMIN B-12 PO), Take 1 tablet by mouth daily., Disp: , Rfl:  .  cyclobenzaprine (FLEXERIL) 10 MG tablet, Take 1 tablet (10 mg total) by mouth 2 (two) times daily as needed for muscle spasms., Disp: 20 tablet, Rfl: 0 .  famotidine (PEPCID) 20 MG tablet, Take 1 tablet (20 mg total) by mouth 2 (two) times daily., Disp: 30 tablet, Rfl: 0 .  folic acid (FOLVITE) 1 MG tablet, Take 1 tablet (1 mg total) by mouth daily., Disp: 30 tablet, Rfl: 0 .  hydrALAZINE (APRESOLINE) 25 MG tablet, Take 1 tablet (25 mg total) by mouth 3 (three) times daily., Disp: 90 tablet, Rfl: 4 .  hydrOXYzine (VISTARIL) 25 MG capsule, Take 25 mg by mouth 3 (three) times daily as needed for anxiety. , Disp: , Rfl:  .  lidocaine (XYLOCAINE) 2 % solution, Use as directed 15 mLs in the mouth or throat as needed for mouth pain., Disp: 150 mL, Rfl: 0 .  lipase/protease/amylase (CREON) 36000 UNITS CPEP capsule, Take 2 capsules (72,000 Units total) by mouth 3 (three) times daily with meals. For pancreatitis, Disp: 180 capsule, Rfl: 5 .  loperamide (IMODIUM  A-D) 2 MG tablet, Take 2 mg by mouth 4 (four) times daily as needed for diarrhea or loose stools., Disp: , Rfl:  .  loratadine (CLARITIN) 10 MG tablet, Take 1 tablet (10 mg total) by mouth daily as needed for allergies or rhinitis. (May purchase from over the counter): For allergies (Patient taking differently: Take 10 mg by mouth daily. (May purchase from over the counter): For allergies), Disp: , Rfl:  .  LORazepam (ATIVAN)  0.5 MG tablet, Take 0.5 mg by mouth every 8 (eight) hours as needed for anxiety., Disp: , Rfl:  .  metoprolol tartrate (LOPRESSOR) 100 MG tablet, Take 1 tablet (100 mg total) by mouth 2 (two) times daily. To lower blood pressure, Disp: 60 tablet, Rfl: 4 .  Multiple Vitamin (MULTIVITAMIN WITH MINERALS) TABS tablet, Take 1 tablet by mouth daily., Disp: 30 tablet, Rfl: 0 .  NASONEX 50 MCG/ACT nasal spray, Place 2 sprays into the nose daily., Disp: , Rfl:  .  ondansetron (ZOFRAN ODT) 4 MG disintegrating tablet, Take 1 tablet (4 mg total) by mouth every 8 (eight) hours as needed for nausea or vomiting., Disp: 20 tablet, Rfl: 0 .  oxyCODONE-acetaminophen (PERCOCET) 7.5-325 MG tablet, Take 1 tablet by mouth every 6 (six) hours as needed for severe pain., Disp: , Rfl:  .  oxyCODONE-acetaminophen (PERCOCET) 7.5-325 MG tablet, Take 1 tablet by mouth every 6 (six) hours as needed for severe pain., Disp: 5 tablet, Rfl: 0 .  pantoprazole (PROTONIX) 40 MG tablet, Take 1 tablet (40 mg total) by mouth 2 (two) times daily., Disp: 60 tablet, Rfl: 3 .  PEG-KCl-NaCl-NaSulf-Na Asc-C (PLENVU) 140 g SOLR, Lot: [id number], Exp: 05-2020, Disp: 1 each, Rfl: 0 .  potassium chloride SA (K-DUR) 20 MEQ tablet, Take 1 tablet (20 mEq total) by mouth daily. (Patient taking differently: Take 20 mEq by mouth daily as needed (potassium). ), Disp: 3 tablet, Rfl: 0 .  QUEtiapine (SEROQUEL) 50 MG tablet, Take 50 mg by mouth at bedtime., Disp: , Rfl:  .  sertraline (ZOLOFT) 100 MG tablet, Take 1 tablet (100 mg total) by mouth  daily. (Patient taking differently: Take 150 mg by mouth daily. ), Disp: 30 tablet, Rfl: 3 .  sucralfate (CARAFATE) 1 g tablet, Take 1 tablet (1 g total) by mouth 4 (four) times daily -  with meals and at bedtime., Disp: 120 tablet, Rfl: 3 .  thiamine 100 MG tablet, Take 1 tablet (100 mg total) by mouth daily., Disp: 30 tablet, Rfl: 0 .  albuterol (VENTOLIN HFA) 108 (90 Base) MCG/ACT inhaler, Inhale 2 puffs into the lungs every 4 (four) hours as needed for wheezing or shortness of breath., Disp: 18 g, Rfl: 11 .  fluticasone furoate-vilanterol (BREO ELLIPTA) 200-25 MCG/INH AEPB, Inhale 1 puff into the lungs daily., Disp: 1 each, Rfl: 11 .  fluticasone furoate-vilanterol (BREO ELLIPTA) 200-25 MCG/INH AEPB, Inhale 1 puff into the lungs daily., Disp: 28 each, Rfl: 0    [name], DO [facility] 11/12/2019 10:20 AM

## 2019-11-12 NOTE — Addendum Note (Signed)
Addended by: Lia Foyer R on: 11/12/2019 03:54 PM   Modules accepted: Orders

## 2019-11-13 ENCOUNTER — Encounter: Payer: Self-pay | Admitting: Gastroenterology

## 2019-11-13 ENCOUNTER — Ambulatory Visit (AMBULATORY_SURGERY_CENTER): Payer: Self-pay | Admitting: Gastroenterology

## 2019-11-13 ENCOUNTER — Telehealth: Payer: Self-pay | Admitting: Gastroenterology

## 2019-11-13 VITALS — BP 124/86 | HR 92 | Temp 97.5°F | Resp 17 | Ht 68.0 in | Wt 114.0 lb

## 2019-11-13 DIAGNOSIS — D125 Benign neoplasm of sigmoid colon: Secondary | ICD-10-CM

## 2019-11-13 DIAGNOSIS — R197 Diarrhea, unspecified: Secondary | ICD-10-CM

## 2019-11-13 DIAGNOSIS — K2950 Unspecified chronic gastritis without bleeding: Secondary | ICD-10-CM

## 2019-11-13 DIAGNOSIS — K319 Disease of stomach and duodenum, unspecified: Secondary | ICD-10-CM

## 2019-11-13 DIAGNOSIS — K2970 Gastritis, unspecified, without bleeding: Secondary | ICD-10-CM

## 2019-11-13 DIAGNOSIS — B3781 Candidal esophagitis: Secondary | ICD-10-CM

## 2019-11-13 DIAGNOSIS — K2980 Duodenitis without bleeding: Secondary | ICD-10-CM

## 2019-11-13 DIAGNOSIS — Z8711 Personal history of peptic ulcer disease: Secondary | ICD-10-CM

## 2019-11-13 DIAGNOSIS — K269 Duodenal ulcer, unspecified as acute or chronic, without hemorrhage or perforation: Secondary | ICD-10-CM

## 2019-11-13 DIAGNOSIS — R634 Abnormal weight loss: Secondary | ICD-10-CM

## 2019-11-13 DIAGNOSIS — R1013 Epigastric pain: Secondary | ICD-10-CM

## 2019-11-13 DIAGNOSIS — D123 Benign neoplasm of transverse colon: Secondary | ICD-10-CM

## 2019-11-13 MED ORDER — FLUCONAZOLE 200 MG PO TABS: ORAL_TABLET | ORAL | 0 refills | Status: DC

## 2019-11-13 MED ORDER — DEXILANT 60 MG PO CPDR: 60.0000 mg | DELAYED_RELEASE_CAPSULE | Freq: Every day | ORAL | 3 refills | Status: DC

## 2019-11-13 MED ORDER — SODIUM CHLORIDE 0.9 % IV SOLN: 500.0000 mL | Freq: Once | INTRAVENOUS | Status: DC

## 2019-11-13 NOTE — Progress Notes (Signed)
Vs by P [employer]

## 2019-11-13 NOTE — Progress Notes (Signed)
Crna,Joshua Monday informed ot increased HR & BP

## 2019-11-13 NOTE — Patient Instructions (Signed)
Handout on polyps.  Stop protonix.  Start Dexilant 60 mg a day ( samples given, pick up prescription as well.) Fluconazole called into pharmacy.  Avoid all Non -steroidal anti inflammatory drugs and alcohol. Continue Carafate as needed.   YOU HAD AN ENDOSCOPIC PROCEDURE TODAY AT [facility]:   Refer to the procedure report that was given to you for any specific questions about what was found during the examination.  If the procedure report does not answer your questions, please call your gastroenterologist to clarify.  If you requested that your care partner not be given the details of your procedure findings, then the procedure report has been included in a sealed envelope for you to review at your convenience later.  YOU SHOULD EXPECT: Some feelings of bloating in the abdomen. Passage of more gas than usual.  Walking can help get rid of the air that was put into your GI tract during the procedure and reduce the bloating. If you had a lower endoscopy (such as a colonoscopy or flexible sigmoidoscopy) you may notice spotting of blood in your stool or on the toilet paper. If you underwent a bowel prep for your procedure, you may not have a normal bowel movement for a few days.  Please Note:  You might notice some irritation and congestion in your nose or some drainage.  This is from the oxygen used during your procedure.  There is no need for concern and it should clear up in a day or so.  SYMPTOMS TO REPORT IMMEDIATELY:   Following lower endoscopy (colonoscopy or flexible sigmoidoscopy):  Excessive amounts of blood in the stool  Significant tenderness or worsening of abdominal pains  Swelling of the abdomen that is new, acute  Fever of 100F or higher   Following upper endoscopy (EGD)  Vomiting of blood or coffee ground material  New chest pain or pain under the shoulder blades  Painful or persistently difficult swallowing  New shortness of breath  Fever of 100F or  higher  Black, tarry-looking stools  For urgent or emergent issues, a gastroenterologist can be reached at any hour by calling [phone]. Do not use MyChart messaging for urgent concerns.    DIET:  We do recommend a small meal at first, but then you may proceed to your regular diet.  Drink plenty of fluids but you should avoid alcoholic beverages for 24 hours.  ACTIVITY:  You should plan to take it easy for the rest of today and you should NOT DRIVE or use heavy machinery until tomorrow (because of the sedation medicines used during the test).    FOLLOW UP: Our staff will call the number listed on your records 48-72 hours following your procedure to check on you and address any questions or concerns that you may have regarding the information given to you following your procedure. If we do not reach you, we will leave a message.  We will attempt to reach you two times.  During this call, we will ask if you have developed any symptoms of COVID 19. If you develop any symptoms (ie: fever, flu-like symptoms, shortness of breath, cough etc.) before then, please call [phone].  If you test positive for Covid 19 in the 2 weeks post procedure, please call and report this information to [place].    If any biopsies were taken you will be contacted by phone or by letter within the next 1-3 weeks.  Please call us at [phone] if you have not  heard about the biopsies in 3 weeks.    SIGNATURES/CONFIDENTIALITY: You and/or your care partner have signed paperwork which will be entered into your electronic medical record.  These signatures attest to the fact that that the information above on your After Visit Summary has been reviewed and is understood.  Full responsibility of the confidentiality of this discharge information lies with you and/or your care-partner.

## 2019-11-13 NOTE — Op Note (Addendum)
[place] Patient Name: [name] Procedure Date: 11/13/2019 1:32 PM MRN: [id number] Endoscopist: Remo Lipps P. Havery Moran , MD Age: 48 Referring MD:  Date of Birth: [birth year] Gender: Male Account #: [id number] Procedure:                Upper GI endoscopy Indications:              Epigastric abdominal pain, Follow-up of history of                            peptic ulcer, Weight loss, chronic pancreatitis -                            on protonix 40mg  twice daily Medicines:                Monitored Anesthesia Care Procedure:                Pre-Anesthesia Assessment:                           - Prior to the procedure, a History and Physical                            was performed, and patient medications and                            allergies were reviewed. The patient's tolerance of                            previous anesthesia was also reviewed. The risks                            and benefits of the procedure and the sedation                            options and risks were discussed with the patient.                            All questions were answered, and informed consent                            was obtained. Prior Anticoagulants: The patient has                            taken no previous anticoagulant or antiplatelet                            agents. ASA Grade Assessment: III - A patient with                            severe systemic disease. After reviewing the risks                            and benefits, the patient was deemed in  satisfactory condition to undergo the procedure.                           After obtaining informed consent, the endoscope was                            passed under direct vision. Throughout the                            procedure, the patient's blood pressure, pulse, and                            oxygen saturations were monitored continuously. The                            Endoscope was  introduced through the mouth, and                            advanced to the second part of duodenum. The upper                            GI endoscopy was accomplished without difficulty.                            The patient tolerated the procedure well. Scope In: Scope Out: Findings:                 Esophagogastric landmarks were identified: the                            Z-line was found at 40 cm, the gastroesophageal                            junction was found at 40 cm and the upper extent of                            the gastric folds was found at 40 cm from the                            incisors.                           Multiple small white plaques were found in the                            upper third of the esophagus, grossly consistent                            with esophageal candidiasis.                           The exam of the esophagus was otherwise normal.                           Patchy mild  inflammation characterized by erythema                            was found in the gastric body and in the gastric                            antrum.                           The exam of the stomach was otherwise normal.                           Biopsies were taken with a cold forceps in the                            gastric body, at the incisura and in the gastric                            antrum for Helicobacter pylori testing.                           One non-bleeding cratered duodenal ulcer with no                            stigmata of bleeding was found in the distal                            duodenal bulb (entering first portion duodenal                            sweep), surrounded by some nodular inflammatory                            appearing mucosa. The lesion was 4 mm in largest                            dimension. Biopsies were taken with a cold forceps                            for histology from the periphery of it / nodular                             mucosa.                           Patchy inflammation characterized by erosions was                            found in the duodenal bulb.                           The exam of the duodenum was otherwise normal. Complications:            No immediate complications. Estimated blood loss:  Minimal. Estimated Blood Loss:     Estimated blood loss was minimal. Impression:               - Esophagogastric landmarks identified.                           - Multiple plaques in the upper third of the                            esophagus, consistent with esophageal candidiasis.                           - Gastritis.                           - Normal stomach otherwise - biopsies taken to rule                            out H pylori                           - Non-bleeding duodenal ulcer with no stigmata of                            bleeding. Biopsied.                           - Duodenitis.                           Duodenal ulcer could be contributing to the                            patient's symptoms, although will await MRCP as                            scheduled to further evaluate pancreatic ductal                            dilation / chronic pancreatitis, which is likely                            driving most of the patient's symptoms Recommendation:           - Patient has a contact number available for                            emergencies. The signs and symptoms of potential                            delayed complications were discussed with the                            patient. Return to normal activities tomorrow.                            Written discharge instructions were  provided to the                            patient.                           - Resume previous diet.                           - Continue present medications.                           - Stop protonix, switch to Dexilant 60mg  / day                            (will provide free  sample)                           - Start fluconazole 400mg  x 1 day, and then 200mg  /                            day for another 13 days if no contraindication                           - Avoid all NSAIDs and alcohol                           - Continue carafate as needed                           - Await pathology results.                           - Consideration for gastrin level given duodenal                            ulcer / chronic diarrhea despite high dose PPI Jason Benzel P. Havery Moros, MD 11/13/2019 2:26:37 PM This report has been signed electronically.

## 2019-11-13 NOTE — Telephone Encounter (Signed)
Pt called requesting referral for pain clinic. Pt states that his medicaid application was denied. However, he will pay out-of-pocket for consult with pain MD.

## 2019-11-13 NOTE — Telephone Encounter (Signed)
We can refer him to wherever he would like to go for this. Jan previously had discussed  [facility] in [place] if he could get medicaid but if his preference is to do this in [place] that is fine with me. I will await MRCP on 7/16 in the interim.

## 2019-11-13 NOTE — Progress Notes (Signed)
Report to PACU, RN, vss, BBS= Clear.  

## 2019-11-13 NOTE — Telephone Encounter (Signed)
Please advise.  Patient is scheduled for CT 7/16

## 2019-11-13 NOTE — Progress Notes (Signed)
Called to room to assist during endoscopic procedure.  Patient ID and intended procedure confirmed with present staff. Received instructions for my participation in the procedure from the performing physician.  

## 2019-11-13 NOTE — Op Note (Signed)
[place] [facility] Patient Name: [name] Procedure Date: 11/13/2019 1:32 PM MRN: [id number] Endoscopist: Remo Lipps P. Havery Moros , MD Age: 49 Referring MD:  Date of Birth: Jun 10, 1970 Gender: Male Account #: [id number] Procedure:                Colonoscopy Indications:              This is the patient's first colonoscopy, Chronic                            diarrhea, Weight loss, history of chronic                            pancreatitis Medicines:                Monitored Anesthesia Care Procedure:                Pre-Anesthesia Assessment:                           - Prior to the procedure, a History and Physical                            was performed, and patient medications and                            allergies were reviewed. The patient's tolerance of                            previous anesthesia was also reviewed. The risks                            and benefits of the procedure and the sedation                            options and risks were discussed with the patient.                            All questions were answered, and informed consent                            was obtained. Prior Anticoagulants: The patient has                            taken no previous anticoagulant or antiplatelet                            agents. ASA Grade Assessment: III - A patient with                            severe systemic disease. After reviewing the risks                            and benefits, the patient was deemed in  satisfactory condition to undergo the procedure.                           After obtaining informed consent, the colonoscope                            was passed under direct vision. Throughout the                            procedure, the patient's blood pressure, pulse, and                            oxygen saturations were monitored continuously. The                            Colonoscope was introduced through the anus and                             advanced to the the terminal ileum, with                            identification of the appendiceal orifice and IC                            valve. The colonoscopy was performed without                            difficulty. The patient tolerated the procedure                            well. The quality of the bowel preparation was                            good. The terminal ileum, ileocecal valve,                            appendiceal orifice, and rectum were photographed. Scope In: 1:51:40 PM Scope Out: 2:11:06 PM Scope Withdrawal Time: 0 hours 15 minutes 53 seconds  Total Procedure Duration: 0 hours 19 minutes 26 seconds  Findings:                 The perianal and digital rectal examinations were                            normal.                           The terminal ileum appeared normal.                           Two sessile polyps were found in the transverse                            colon. The polyps were 3 to 7 mm in size. These  polyps were removed with a cold snare. Resection                            and retrieval were complete.                           A 3 to 4 mm polyp was found in the sigmoid colon.                            The polyp was sessile. The polyp was removed with a                            cold snare. Resection and retrieval were complete.                           Internal hemorrhoids were found during                            retroflexion. The hemorrhoids were small.                           The exam was otherwise without abnormality. There                            was some residual gum / seeds in the cecum which                            were lavaged / washed and no polyps / lesions noted                            in the cecum.                           Biopsies for histology were taken with a cold                            forceps from the right colon, left colon and                             transverse colon for evaluation of microscopic                            colitis. Complications:            No immediate complications. Estimated blood loss:                            Minimal. Estimated Blood Loss:     Estimated blood loss was minimal. Impression:               - The examined portion of the ileum was normal.                           - Two 3 to 7 mm polyps in the transverse colon,  removed with a cold snare. Resected and retrieved.                           - One 3 to 4 mm polyp in the sigmoid colon, removed                            with a cold snare. Resected and retrieved.                           - Internal hemorrhoids.                           - The examination was otherwise normal.                           - Biopsies were taken with a cold forceps from the                            right colon, left colon and transverse colon for                            evaluation of microscopic colitis. Recommendation:           - Patient has a contact number available for                            emergencies. The signs and symptoms of potential                            delayed complications were discussed with the                            patient. Return to normal activities tomorrow.                            Written discharge instructions were provided to the                            patient.                           - Resume previous diet.                           - Continue present medications.                           - Await pathology results with further                            recommendations. Remo Lipps P. Rishi Vicario, MD 11/13/2019 2:17:14 PM This report has been signed electronically.

## 2019-11-13 NOTE — Progress Notes (Signed)
Lido with induction ok'd by dr Havery Moros (tachy)

## 2019-11-14 ENCOUNTER — Other Ambulatory Visit: Payer: Self-pay

## 2019-11-14 ENCOUNTER — Telehealth: Payer: Self-pay | Admitting: Gastroenterology

## 2019-11-14 ENCOUNTER — Telehealth: Payer: Self-pay | Admitting: Family Medicine

## 2019-11-14 DIAGNOSIS — K860 Alcohol-induced chronic pancreatitis: Secondary | ICD-10-CM

## 2019-11-14 MED ORDER — TRAMADOL HCL 50 MG PO TABS: 50.0000 mg | ORAL_TABLET | Freq: Three times a day (TID) | ORAL | 0 refills | Status: DC | PRN

## 2019-11-14 NOTE — Telephone Encounter (Signed)
Please advise   Copied from [place] [phone]. Topic: General - Other >> Nov 14, 2019  4:04 PM Leward Quan A wrote: Reason for CRM: Patient called to inform Dr Chapman Fitch that he had a Colonoscopy and an Endoscopy done on 11/13/19 and was informed by Dr Zachery Conch to contact Dr Chapman Fitch to write an Rx for oxyCODONE-acetaminophen (PERCOCET) 7.5-325 MG tablet. Patient can be reached at Ph# [phone]

## 2019-11-14 NOTE — Telephone Encounter (Signed)
As discussed Pain Management will not see pt unless he has insurance (he has been denied by [facility] but can appeal) or is willing to self pay (minimum of $850 for initial visit). Patient is aware.

## 2019-11-14 NOTE — Telephone Encounter (Signed)
Patient notified that pain clinic will only see him if he has insurance.  He is appealing the Medicaid denial.  He will let us know if it gets approved. He will keep his appt for next week MRI

## 2019-11-14 NOTE — Telephone Encounter (Signed)
I called back the patient. He is having ongoing discomfort, his baseline epigastric pain which is a bit worse than his usual. He felt uncomfortable from the bowel prep pre-procedure and the evening prior when he first started the prep, states it made his pancreas hurt. He has felt the same if not a bit better since the procedure and starting the Dexilant, but asking for pain medication. He is eating well, no vomiting, no fevers. He sounds like he is at baseline. I think his pain is from his chronic pancreatitis based on his description of it at this time, perhaps with component of PUD. I advised him to have chronic narcotics for this it should go through pain management and our staff will set him up with pain management as soon as we can, I do not prescribe chronic narcotics. Otherwise, he should continue the [place] as given to him yesterday for the ulcer and avoid NSAIDs. I will have path back for him in a few days. Otherwise, will give him a very short supply of tramadol for the next few days in hopes of keeping him out of the ED. If worsening symptoms of abdominal pain, vomiting, fever, etc, he needs to go to the ED for further care. He verbalized understanding and agreed.   Jan, can you please check on the status of referral to pain management, otherwise will await results of his MRCP

## 2019-11-14 NOTE — Telephone Encounter (Signed)
I have reviewed telephone notes from Dr [name] Mount office and see no documentation of that but do see he received a prescription for Tramadol today as his pain is of GI etiology and we do not prescribe Percocet in the Clinic.

## 2019-11-14 NOTE — Telephone Encounter (Signed)
Pt had colon yesterday and states that he has been in a lot of pain. He would like some pain medication prescribed. Pls call him.

## 2019-11-14 NOTE — Telephone Encounter (Signed)
Called patient back to ask about pain status and get more info. Patient had an ENDO/COLON yesterday and went home passing gas and was able to eat. He had creamed potatoes, chicken and rice and a salad. I explained that salad can sometimes bloat you and that he could take a gas pill for cramping. He is requesting a prescription for pain medication and rates his cramping a 7/10. He did comment on how he asked his PCP but they encouraged him to call us since his pain was in his GI tract.

## 2019-11-15 ENCOUNTER — Ambulatory Visit: Payer: Self-pay | Admitting: Family Medicine

## 2019-11-15 ENCOUNTER — Telehealth: Payer: Self-pay

## 2019-11-15 ENCOUNTER — Ambulatory Visit: Payer: Self-pay | Attending: Family Medicine

## 2019-11-15 ENCOUNTER — Other Ambulatory Visit: Payer: Self-pay

## 2019-11-15 NOTE — Telephone Encounter (Signed)
  Follow up Call-  Call back number 11/13/2019  Post procedure Call Back phone  # [phone]  Permission to leave phone message Yes  Some recent data might be hidden     Patient questions:  Do you have a fever, pain , or abdominal swelling? No. Pain Score  0 *  Have you tolerated food without any problems? No. Vomiting- last night. Ate a hamburger.  Going to try to try crackers and sprite first.  Michela Pitcher it didn't look like coffee grounds, looked like the food.    Have you been able to return to your normal activities? Yes.    Do you have any questions about your discharge instructions: Diet   No. Medications  No. Follow up visit  No.  Do you have questions or concerns about your Care? No.  Actions: * If pain score is 4 or above: No action needed, pain <4.  Have you developed a fever since your procedure? No 2.   Have you had an respiratory symptoms (SOB or cough) since your procedure? No  3.   Have you tested positive for COVID 19 since your procedure No  4.   Have you had any family members/close contacts diagnosed with the COVID 19 since your procedure?  No   If yes to any of these questions please route to Joylene John, RN and Erenest Rasher, RN

## 2019-11-15 NOTE — Telephone Encounter (Signed)
Patient was called and a voicemail was left informing him to contact office.  Patient needs to be informed that we do not prescribe Percocets in this clinic.

## 2019-11-15 NOTE — Telephone Encounter (Signed)
Patient assistance has been approved for Creon 36,000 mg tablets thru Sep 18, 2020.   [phone]

## 2019-11-17 ENCOUNTER — Emergency Department (HOSPITAL_COMMUNITY): Payer: Medicaid Other

## 2019-11-17 ENCOUNTER — Telehealth: Payer: Self-pay | Admitting: Nurse Practitioner

## 2019-11-17 ENCOUNTER — Encounter (HOSPITAL_COMMUNITY): Payer: Self-pay | Admitting: Emergency Medicine

## 2019-11-17 ENCOUNTER — Emergency Department (HOSPITAL_COMMUNITY)
Admission: EM | Admit: 2019-11-17 | Discharge: 2019-11-17 | Disposition: A | Discharge status: Home / Self Care | Payer: Medicaid Other | Attending: Emergency Medicine | Admitting: Emergency Medicine

## 2019-11-17 ENCOUNTER — Other Ambulatory Visit: Payer: Self-pay

## 2019-11-17 DIAGNOSIS — Z7951 Long term (current) use of inhaled steroids: Secondary | ICD-10-CM | POA: Insufficient documentation

## 2019-11-17 DIAGNOSIS — K219 Gastro-esophageal reflux disease without esophagitis: Secondary | ICD-10-CM | POA: Insufficient documentation

## 2019-11-17 DIAGNOSIS — I456 Pre-excitation syndrome: Secondary | ICD-10-CM | POA: Diagnosis not present

## 2019-11-17 DIAGNOSIS — K279 Peptic ulcer, site unspecified, unspecified as acute or chronic, without hemorrhage or perforation: Secondary | ICD-10-CM

## 2019-11-17 DIAGNOSIS — R1013 Epigastric pain: Secondary | ICD-10-CM | POA: Diagnosis present

## 2019-11-17 DIAGNOSIS — F17290 Nicotine dependence, other tobacco product, uncomplicated: Secondary | ICD-10-CM | POA: Insufficient documentation

## 2019-11-17 DIAGNOSIS — J45909 Unspecified asthma, uncomplicated: Secondary | ICD-10-CM | POA: Diagnosis not present

## 2019-11-17 DIAGNOSIS — K273 Acute peptic ulcer, site unspecified, without hemorrhage or perforation: Secondary | ICD-10-CM | POA: Insufficient documentation

## 2019-11-17 DIAGNOSIS — Z91040 Latex allergy status: Secondary | ICD-10-CM | POA: Diagnosis not present

## 2019-11-17 DIAGNOSIS — Z79899 Other long term (current) drug therapy: Secondary | ICD-10-CM | POA: Diagnosis not present

## 2019-11-17 DIAGNOSIS — K861 Other chronic pancreatitis: Secondary | ICD-10-CM | POA: Diagnosis not present

## 2019-11-17 DIAGNOSIS — I10 Essential (primary) hypertension: Secondary | ICD-10-CM | POA: Diagnosis not present

## 2019-11-17 DIAGNOSIS — K209 Esophagitis, unspecified without bleeding: Secondary | ICD-10-CM | POA: Insufficient documentation

## 2019-11-17 LAB — BASIC METABOLIC PANEL
Anion gap: 9 (ref 5–15)
BUN: 6 mg/dL (ref 6–20)
CO2: 24 mmol/L (ref 22–32)
Calcium: 9 mg/dL (ref 8.9–10.3)
Chloride: 104 mmol/L (ref 98–111)
Creatinine, Ser: 0.71 mg/dL (ref 0.61–1.24)
GFR calc Af Amer: >60 mL/min (ref >60)
GFR calc non Af Amer: >60 mL/min (ref >60)
Glucose, Bld: 102 mg/dL — ABNORMAL HIGH (ref 70–99)
Potassium: 4.3 mmol/L (ref 3.5–5.1)
Sodium: 137 mmol/L (ref 135–145)

## 2019-11-17 LAB — CBC
HCT: 30.3 % — ABNORMAL LOW (ref 39.0–52.0)
Hemoglobin: 9.7 g/dL — ABNORMAL LOW (ref 13.0–17.0)
MCH: 27.4 pg (ref 26.0–34.0)
MCHC: 32 g/dL (ref 30.0–36.0)
MCV: 85.6 fL (ref 80.0–100.0)
Platelets: 194 10*3/uL (ref 150–400)
RBC: 3.54 MIL/uL — ABNORMAL LOW (ref 4.22–5.81)
RDW: 18.6 % — ABNORMAL HIGH (ref 11.5–15.5)
WBC: 5.9 10*3/uL (ref 4.0–10.5)
nRBC: 0 % (ref 0.0–0.2)

## 2019-11-17 LAB — TROPONIN I (HIGH SENSITIVITY)
Troponin I (High Sensitivity): 4 ng/L (ref <18)
Troponin I (High Sensitivity): 3 ng/L (ref <18)

## 2019-11-17 LAB — LIPASE, BLOOD: Lipase: 23 U/L (ref 11–51)

## 2019-11-17 MED ORDER — LIDOCAINE VISCOUS HCL 2 % MT SOLN
15.0000 mL | Freq: Once | OROMUCOSAL | Status: AC
  Administered 2019-11-17: 15 mL via ORAL
  Filled 2019-11-17: qty 15

## 2019-11-17 MED ORDER — HYDROMORPHONE HCL 1 MG/ML IJ SOLN
1.0000 mg | Freq: Once | INTRAMUSCULAR | Status: AC
  Administered 2019-11-17: 1 mg via INTRAVENOUS
  Filled 2019-11-17: qty 1

## 2019-11-17 MED ORDER — ALUM & MAG HYDROXIDE-SIMETH 200-200-20 MG/5ML PO SUSP
30.0000 mL | Freq: Once | ORAL | Status: AC
  Administered 2019-11-17: 30 mL via ORAL
  Filled 2019-11-17: qty 30

## 2019-11-17 MED ORDER — OXYCODONE-ACETAMINOPHEN 5-325 MG PO TABS: 1.0000 | ORAL_TABLET | Freq: Three times a day (TID) | ORAL | 0 refills | Status: AC | PRN

## 2019-11-17 MED ORDER — CHLORDIAZEPOXIDE HCL 25 MG PO CAPS
25.0000 mg | ORAL_CAPSULE | Freq: Once | ORAL | Status: AC
  Administered 2019-11-17: 25 mg via ORAL
  Filled 2019-11-17: qty 1

## 2019-11-17 MED ORDER — OXYCODONE-ACETAMINOPHEN 5-325 MG PO TABS
1.0000 | ORAL_TABLET | Freq: Once | ORAL | Status: AC
  Administered 2019-11-17: 1 via ORAL
  Filled 2019-11-17: qty 1

## 2019-11-17 MED ORDER — SODIUM CHLORIDE 0.9% FLUSH: 3.0000 mL | Freq: Once | INTRAVENOUS | Status: DC

## 2019-11-17 NOTE — Discharge Instructions (Signed)
Substance Abuse Treatment Programs ° °Intensive Outpatient Programs °[facility]     °[address]      °[place], [place]                   °[phone]      ° °[facility] °[address] °[place], [place] °[phone] ° °[facility]     °(Inpatient and outpatient)     °[address]           °[phone]   ° °[facility] °[phone] (Suboxone and Methadone) ° °[address]      °[place], [place] [address]      °[phone]      ° °[address] °[place], [place] °[phone] ° °[facility] (Outpatient/Inpatient, [facility])    °(insurance only) [phone]      °       °[facility] (Groups & Residential) °[place], [place] °[phone] ° °   °[facility]     °[address]     °[place], [place]      °[phone]      ° °[facility] (for caregivers and family) °[address] °[place], [place] °[phone] ° ° ° ° ° °Residential Treatment Programs °[facility]      °[address], [place], [place] [address]  °[phone]      ° °[facility] °[address] , [place] [address] °[phone] ° °[facility]        °[phone]      ° °[facility] °[phone] ° °[facility]             °[address]                                         °[place], [place]                                                °[phone] or [phone]                              ° °[facility] [place] °[address] °[place], [address] °[phone] ° °[facility]    °[address]      °, [place]     °[phone]      ° °[facility] °[address] °, [place] °[phone] ° °[facility]   °[address]     °[place], [place] [address]     °[phone]      °Admissions: 8am-3pm M-F ° °[facility] °[address] °[place],  [place] °[phone] ° °[facility]: Residential Program (90 Days)   °[place],       °[phone] or [phone]    ° °[facility] °[place],  °(Walk in Hours over the weekend or by referral) ° °[facility] °[address], [place],  [address] °[phone] ° °Crisis Mobile: [facility]:  1-877-626-1772 (for crisis response 24 hours a day) °[facility] Hotline:      1-800-256-2452 °Outpatient Psychiatry and Counseling ° °[facility]: Mobile Crisis   Management 24 hours:  1-(478)532-6185  [facility] of the [employer] sliding scale fee and walk in schedule: M-F 8am-12pm/1pm-3pm [address]  [place], [place] [address] [place], [place] [address] [phone]  [facility])- new patient walk-in appointments available Monday - Friday 8am -3pm.          [address] [place], [place] [address] [phone] or crisis line- [facility]/ Intensive Outpatient Therapy Program [place], [place] [address] [place]      [phone] [address] [place], [place] [address]                 [place]   [facility] [phone]. [place], [place] [address]   [employer] [facility]          [address]  [place], [place] [address]       [phone]  [place], [place], [place] [address] [phone]  [facility]    [address] [place], [place] [address]     [facility] [place], [place] [address]celyn Man     [place] [address]     [phone]       [facility] [place] [address]  [facility]     [address], [place], [place], [place] [address] [place]     [address]     [place], [place] [address]     [phone]       [facility] [address] [place], [place] [address] [phone] Resources for Temporary Residential Assistance/Crisis [facility]) M-F 8am-3pm   [address], [place] [address]   [phone] Services include: laundry, barbering, support groups, case management, phone  & computer access, showers, AA/NA mtgs, mental health/substance abuse nurse, job skills class, disability information, VA assistance, spiritual classes, etc.

## 2019-11-17 NOTE — ED Notes (Signed)
Lab called to add on lipase.

## 2019-11-17 NOTE — ED Notes (Signed)
Pt approached this tech asking for BP and temperature to be taken. Pt stated that he feels like his "BP is high because I'm sweating a lot."

## 2019-11-17 NOTE — ED Provider Notes (Addendum)
[facility] EMERGENCY DEPARTMENT Provider Note   CSN: [id number] Arrival date & time: 11/17/19  0214     History Chief Complaint  Patient presents with  . Chest Pain    [name] is a 49 y.o. male.  HPI  HPI: A 49 year old patient with a history of hypertension presents for evaluation of chest pain. Initial onset of pain was more than 6 hours ago. The patient's chest pain is sharp and is not worse with exertion. The patient complains of nausea. The patient's chest pain is middle- or left-sided, is not well-localized, is not described as heaviness/pressure/tightness and does not radiate to the arms/jaw/neck. The patient denies diaphoresis. The patient has smoked in the past 90 days. The patient has no history of stroke, has no history of peripheral artery disease, denies any history of treated diabetes, has no relevant family history of coronary artery disease (first degree relative at less than age 40), has no history of hypercholesterolemia and does not have an elevated BMI (>=30).  49 y/o comes in with cc of chest pain. Pt has hx of alcoholism, PUD, pancreatitis.  Epigastric abdominal pain started 3 days ago.  It was intermittent until it became constant last night.  Pain is located in the epigastric region and radiating to the back.  Additionally he also started having chest pain last night.  Chest pain is central and radiating to the neck. Pt had n/v starting y'day. There was streaks of blood in the emesis. Pt had upper and lower endoscopy scheduled on Friday.  The scope showed that patient had duodenal ulcer, gastric ulcer, esophageal candidiasis.   Patient reports that he occasionally uses cocaine but has not used it in several weeks now.  He also has history of alcoholism but has stopped drinking 2 or 3 weeks ago.  Patient does smoke half a pack a day of cigarettes.  Patient's epigastric chest pain is similar to his pancreatitis and gastritis pain.  He  just has not had alcohol as the working factor.  He denies any chest pain like this in the past.  Past Medical History:  Diagnosis Date  . Alcoholism /alcohol abuse ([place])   . Anemia   . Anxiety   . Arthritis    "knees; arms; elbows" (03/26/2015)  . Asthma   . Bipolar disorder ([place])   . Chronic bronchitis ([place])   . Chronic lower back pain   . Chronic pancreatitis ([place])   . Cocaine abuse ([place])   . Depression   . Family history of adverse reaction to anesthesia    "grandmother gets confused"  . Femoral condyle fracture ([place]) 03/08/2014   left medial/notes 03/09/2014  . GERD (gastroesophageal reflux disease)   . H/O hiatal hernia   . H/O suicide attempt 10/2012  . High cholesterol   . History of blood transfusion 10/2012   "when I tried to commit suicide"  . History of stomach ulcers   . Hypertension   . Marijuana abuse, continuous   . Migraine    "a few times/year" (03/26/2015)  . Pneumonia 1990's X 3  . PTSD (post-traumatic stress disorder)   . Sickle cell trait ([place])   . WPW (Wolff-Parkinson-White syndrome)    [name] 03/06/2013    Patient Active Problem List   Diagnosis Date Noted  . AKI (acute kidney injury) ([place]) 11/13/2018  . Seizure ([place]) 11/13/2018  . Gastritis and gastroduodenitis   . Chest pain 01/08/2018  . GI bleed 11/24/2017  . Acute blood loss  anemia 11/24/2017  . Atypical chest pain 11/24/2017  . Acute pancreatitis 09/28/2017  . Abdominal pain 05/27/2017  . Hematemesis 05/27/2017  . Tachycardia 03/18/2017  . Diarrhea 03/18/2017  . Acute on chronic pancreatitis ([place]) 12/17/2016  . Intractable nausea and vomiting 12/05/2016  . Verbally abusive behavior 12/05/2016  . Normocytic anemia 12/05/2016  . Alcohol use disorder, severe, dependence ([place]) 07/25/2016  . Cocaine use disorder, severe, dependence ([place]) 07/25/2016  . 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 Leukocytosis   . Hospital acquired PNA  05/20/2015  . Chronic pancreatitis ([place]) 05/18/2015  . Pseudocyst of pancreas 05/18/2015  . Polysubstance abuse (tobacco, cocaine, THC, and ETOH) 03/26/2015  . Alcohol-induced chronic pancreatitis ([place])   . Essential hypertension 02/06/2014  . Mood disorder ([place]) 02/06/2014  . Alcohol-induced acute pancreatitis 11/28/2013  . Pancreatic pseudocyst/cyst 11/25/2013  . Severe protein-calorie malnutrition ([place]) 10/10/2013  . Suicide attempt ([place]) 10/08/2013  . 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 TOBACCO ABUSE 03/23/2007    Past Surgical History:  Procedure Laterality Date  . BIOPSY  11/25/2017   Procedure: BIOPSY;  Surgeon: Arta Silence, MD;  Location: [facility] ENDOSCOPY;  Service: Endoscopy;;  . BIOPSY  10/14/2018   Procedure: BIOPSY;  Surgeon: Arta Silence, MD;  Location: [facility];  Service: Endoscopy;;  . CARDIAC CATHETERIZATION    . ESOPHAGOGASTRODUODENOSCOPY (EGD) WITH PROPOFOL N/A 11/25/2017   Procedure: ESOPHAGOGASTRODUODENOSCOPY (EGD) WITH PROPOFOL;  Surgeon: Arta Silence, MD;  Location: [facility];  Service: Endoscopy;  Laterality: N/A;  . ESOPHAGOGASTRODUODENOSCOPY (EGD) WITH PROPOFOL Left 10/14/2018   Procedure: ESOPHAGOGASTRODUODENOSCOPY (EGD) WITH PROPOFOL;  Surgeon: Arta Silence, MD;  Location: [facility] ENDOSCOPY;  Service: Endoscopy;  Laterality: Left;  . ESOPHAGOGASTRODUODENOSCOPY (EGD) WITH PROPOFOL N/A 11/14/2018   Procedure: ESOPHAGOGASTRODUODENOSCOPY (EGD) WITH PROPOFOL;  Surgeon: Laurence Spates, MD;  Location: [facility] ENDOSCOPY;  Service: Gastroenterology;  Laterality: N/A;  . EYE SURGERY Left 1990's   "result of trauma"   . FACIAL FRACTURE SURGERY Left 1990's   "result of trauma"   . FRACTURE SURGERY    . HERNIA REPAIR    . LEFT HEART CATHETERIZATION WITH CORONARY ANGIOGRAM Right 03/07/2013   Procedure: LEFT HEART CATHETERIZATION WITH CORONARY ANGIOGRAM;  Surgeon: [name], MD;  Location: [facility] CATH LAB;  Service: Cardiovascular;   Laterality: Right;  . UMBILICAL HERNIA REPAIR    . UPPER GASTROINTESTINAL ENDOSCOPY         Family History  Problem Relation Age of Onset  . Hypertension Mother   . Cirrhosis Mother   . Alcoholism Mother   . Hypertension Father   . Melanoma Father   . Hypertension Other   . Coronary artery disease Other     Social History   Tobacco Use  . Smoking status: Current Every Day Smoker    Packs/day: 1.00    Years: 33.00    Pack years: 33.00    Types: Cigarettes, E-cigarettes  . Smokeless tobacco: Never Used  Vaping Use  . Vaping Use: Former  Substance Use Topics  . Alcohol use: Yes  . Drug use: Yes    Types: Marijuana, Cocaine    Comment: daily marijuana use; last cocaine use about 3 months ago    Home Medications Prior to Admission medications   Medication Sig Start Date End Date Taking? Authorizing Provider  acetaminophen (TYLENOL) 500 MG tablet Take 2 tablets (1,000 mg total) by mouth every 8 (eight) hours as needed for mild pain. 08/29/19  Yes Elodia Florence.,  MD  albuterol (VENTOLIN HFA) 108 (90 Base) MCG/ACT inhaler Inhale 2 puffs into the lungs every 4 (four) hours as needed for wheezing or shortness of breath. 11/12/19  Yes Noemi Chapel P, DO  Cyanocobalamin (VITAMIN B-12 PO) Take 1 tablet by mouth daily.   Yes [provider]  cyclobenzaprine (FLEXERIL) 10 MG tablet Take 1 tablet (10 mg total) by mouth 2 (two) times daily as needed for muscle spasms. 03/03/19  Yes Recardo Evangelist, PA-C  dexlansoprazole (DEXILANT) 60 MG capsule Take 1 capsule (60 mg total) by mouth daily. 11/13/19  Yes [name], [name], MD  famotidine (PEPCID) 20 MG tablet Take 1 tablet (20 mg total) by mouth 2 (two) times daily. 08/08/19  Yes [place] Cater, PA-C  fluconazole (DIFLUCAN) 200 MG tablet Take 400 mg on day 1, then take 200 mg a day for the next 13 days Patient taking differently: Take 200 mg by mouth daily.  11/13/19  Yes [name], [name], MD  folic acid (FOLVITE) 1 MG  tablet Take 1 tablet (1 mg total) by mouth daily. 11/26/17  Yes [name], MD  hydrALAZINE (APRESOLINE) 25 MG tablet Take 1 tablet (25 mg total) by mouth 3 (three) times daily. 09/09/19  Yes Fulp, Cammie, MD  hydrOXYzine (VISTARIL) 25 MG capsule Take 25 mg by mouth 3 (three) times daily as needed for anxiety.  06/19/19  Yes [provider]  lidocaine (XYLOCAINE) 2 % solution USE AS DIRECTED( 15 ML IN THE MOUTH OR THROAT AS NEEDED FOR MOUTH PAIN) Patient taking differently: Use as directed 15 mLs in the mouth or throat as needed for mouth pain.  11/12/19  Yes Fulp, Cammie, MD  lipase/protease/amylase (CREON) 36000 UNITS CPEP capsule Take 2 capsules (72,000 Units total) by mouth 3 (three) times daily with meals. For pancreatitis 11/05/19  Yes [name], [name], MD  loperamide (IMODIUM A-D) 2 MG tablet Take 2 mg by mouth 4 (four) times daily as needed for diarrhea or loose stools.   Yes [provider]  LORazepam (ATIVAN) 0.5 MG tablet Take 0.5 mg by mouth every 8 (eight) hours as needed for anxiety.    Yes [provider]  metoprolol tartrate (LOPRESSOR) 100 MG tablet Take 1 tablet (100 mg total) by mouth 2 (two) times daily. To lower blood pressure 09/09/19  Yes Fulp, Cammie, MD  Multiple Vitamin (MULTIVITAMIN WITH MINERALS) TABS tablet Take 1 tablet by mouth daily. 09/06/17  Yes Bonnell Public, MD  NASONEX 50 MCG/ACT nasal spray Place 2 sprays into the nose daily. 06/19/19  Yes [provider]  ondansetron (ZOFRAN ODT) 4 MG disintegrating tablet Take 1 tablet (4 mg total) by mouth every 8 (eight) hours as needed for nausea or vomiting. 10/06/19  Yes Fondaw, Wylder S, PA  potassium chloride SA (K-DUR) 20 MEQ tablet Take 1 tablet (20 mEq total) by mouth daily. Patient taking differently: Take 20 mEq by mouth daily as needed (potassium).  01/07/19  Yes Sherwood Gambler, MD  QUEtiapine (SEROQUEL) 50 MG tablet Take 50 mg by mouth at bedtime.   Yes [provider]  sertraline (ZOLOFT) 100 MG tablet Take 1 tablet (100 mg total) by mouth daily. Patient taking differently: Take 150 mg by mouth daily.  08/29/17  Yes Clent Demark, PA-C  sucralfate (CARAFATE) 1 g tablet Take 1 tablet (1 g total) by mouth 4 (four) times daily -  with meals and at bedtime. 11/05/19  Yes [name], [name], MD  thiamine 100 MG tablet  Take 1 tablet (100 mg total) by mouth daily. 10/16/18  Yes [name], MD  traMADol (ULTRAM) 50 MG tablet Take 1 tablet (50 mg total) by mouth every 8 (eight) hours as needed for severe pain. 11/14/19  Yes [name], [name], MD  chlordiazePOXIDE (LIBRIUM) 10 MG capsule Take 1 capsule (10 mg total) by mouth 3 (three) times daily as needed for anxiety or withdrawal. Patient not taking: Reported on 11/13/2019 09/06/19   Fulp, Cammie, MD  fluticasone furoate-vilanterol (BREO ELLIPTA) 200-25 MCG/INH AEPB Inhale 1 puff into the lungs daily. 11/12/19   Noemi Chapel P, DO  fluticasone furoate-vilanterol (BREO ELLIPTA) 200-25 MCG/INH AEPB Inhale 1 puff into the lungs daily. 11/12/19   [name], DO  oxyCODONE-acetaminophen (PERCOCET/ROXICET) 5-325 MG tablet Take 1 tablet by mouth every 8 (eight) hours as needed for up to 3 days for severe pain. 11/17/19 11/20/19  Varney Biles, MD  pantoprazole (PROTONIX) 40 MG tablet Take 1 tablet (40 mg total) by mouth 2 (two) times daily. Patient not taking: Reported on 11/17/2019 11/05/19   Yetta Flock, MD  amitriptyline (ELAVIL) 25 MG tablet Take 1 tablet (25 mg total) by mouth at bedtime. Patient not taking: Reported on 08/08/2019 10/15/18 08/08/19  [name], MD  gabapentin (NEURONTIN) 100 MG capsule Take 1 capsule (100 mg total) by mouth 2 (two) times daily. Patient not taking: Reported on 08/08/2019 10/15/18 08/08/19  [name], MD  promethazine (PHENERGAN) 25 MG tablet Take 1 tablet (25 mg total) by mouth every 6 (six) hours as needed for nausea or vomiting. Patient not taking: Reported on 08/08/2019  03/02/19 08/08/19  Kinnie Feil, PA-C    Allergies    Robaxin [methocarbamol], Shellfish-derived products, Trazodone, Trazodone and nefazodone, Adhesive [tape], Latex, Toradol [ketorolac tromethamine], Contrast media [iodinated diagnostic agents], and Reglan [metoclopramide]  Review of Systems   Review of Systems  Constitutional: Positive for activity change.  Respiratory: Negative for shortness of breath.   Cardiovascular: Positive for chest pain.  Gastrointestinal: Positive for abdominal pain, nausea and vomiting.  All other systems reviewed and are negative.   Physical Exam Updated Vital Signs BP (!) 143/102 (BP Location: Left Arm)   Pulse (!) 108   Temp 99.1 F (37.3 C) (Oral)   Resp 17   SpO2 100%   Physical Exam Vitals and nursing note reviewed.  Constitutional:      Appearance: He is well-developed.  HENT:     Head: Atraumatic.  Cardiovascular:     Rate and Rhythm: Normal rate.  Pulmonary:     Effort: Pulmonary effort is normal.  Musculoskeletal:     Cervical back: Neck supple.  Skin:    General: Skin is warm.  Neurological:     Mental Status: He is alert and oriented to person, place, and time.     ED Results / Procedures / Treatments   Labs (all labs ordered are listed, but only abnormal results are displayed) Labs Reviewed  BASIC METABOLIC PANEL - Abnormal; Notable for the following components:      Result Value   Glucose, Bld 102 (*)    All other components within normal limits  CBC - Abnormal; Notable for the following components:   RBC 3.54 (*)    Hemoglobin 9.7 (*)    HCT 30.3 (*)    RDW 18.6 (*)    All other components within normal limits  LIPASE, BLOOD  TROPONIN I (HIGH SENSITIVITY)  TROPONIN I (HIGH SENSITIVITY)    EKG  EKG Interpretation  Date/Time:  "Sunday November 17 2019 05:06:16 EDT Ventricular Rate:  87 PR Interval:  106 QRS Duration: 84 QT Interval:  342 QTC Calculation: 411 R Axis:   67 Text Interpretation: Sinus rhythm  with short PR Right atrial enlargement Nonspecific T wave abnormality Abnormal ECG No acute changes No significant change since last tracing Confirmed by ,  ([id number]) on 11/17/2019 11:10:35 AM   Radiology DG Chest 2 View  Result Date: 11/17/2019 CLINICAL DATA:  Chest pain. EXAM: CHEST - 2 VIEW COMPARISON:  Oct 06, 2019 FINDINGS: There is no evidence of acute infiltrate, pleural effusion or pneumothorax. The heart size and mediastinal contours are within normal limits. The visualized skeletal structures are unremarkable. IMPRESSION: No active cardiopulmonary disease. Electronically Signed   By: Thaddeus  Houston M.D.   On: 11/17/2019 04:00    Procedures Procedures (including critical care time)  Medications Ordered in ED Medications  sodium chloride flush (NS) 0.9 % injection 3 mL (has no administration in time range)  oxyCODONE-acetaminophen (PERCOCET/ROXICET) 5-325 MG per tablet 1 tablet (has no administration in time range)  chlordiazePOXIDE (LIBRIUM) capsule 25 mg (has no administration in time range)  HYDROmorphone (DILAUDID) injection 1 mg (1 mg Intravenous Given 11/17/19 1216)  alum & mag hydroxide-simeth (MAALOX/MYLANTA) 200-200-20 MG/5ML suspension 30 mL (30 mLs Oral Given 11/17/19 1215)    And  lidocaine (XYLOCAINE) 2 % viscous mouth solution 15 mL (15 mLs Oral Given 11/17/19 1215)    ED Course  I have reviewed the triage vital signs and the nursing notes.  Pertinent labs & imaging results that were available during my care of the patient were reviewed by me and considered in my medical decision making (see chart for details).  Clinical Course as of Nov 16 1336  Sun Nov 17, 2019  1337 Pt reassessed. [name] VSS and WNL. [name] cap refill < 3 seconds. Pt has been hydrated in the ER and now passed po challenge. We will discharge with antiemetic. Strict ER return precautions have been discussed and pt will return if he is unable to tolerate fluids and symptoms are getting  worse.    [AN]  1337 [facility]ers support consult placed.  Patient will go home with Percocet for 3 days for his severe pain.  I am hoping that it would help him stay off of alcohol use.   [AN]  1338 Strict ER return precautions have been discussed, and patient is agreeing with the plan and is comfortable with the workup done and the recommendations from the ER.   [AN]    Clinical Course User Index [AN] , , MD   MDM Rules/Calculators/A&P HEAR Score: 2                        49"  year old male comes in with chest pain and epigastric abdominal pain.  These 2 are separate pains.  The epigastric abdominal pain is radiating to the back.  The chest pain is generalized with some radiation towards the neck.  Patient has history of cocaine use, heavy smoking history and alcoholism.  He also has history of pancreatitis and pseudocyst.  Patient recently had both upper and lower endoscopy which showed ulceration in his upper GI tract.  On exam patient does not have any peritoneal findings.  His vascular exam reveals equal and intact radial pulse.  Patient's chest pain appears to be most likely related to esophageal spasms and not ACS or dissection.  Delta troponin ordered -and they are  negative.  Patient has been constant since last night and he has a hear score of 2.  Patient has been effectively ruled out of ACS.  We also considered perforation given the recent surgery.  X-ray is not showing any free air, and patient is not toxic appearing and there is no leukocytosis, all reassuring.  Plan is to focus on pain control and reassess.   Final Clinical Impression(s) / ED Diagnoses Final diagnoses:  Chronic pancreatitis, unspecified pancreatitis type ([place])  Esophagitis  PUD (peptic ulcer disease)    Rx / DC Orders ED Discharge Orders         Ordered    oxyCODONE-acetaminophen (PERCOCET/ROXICET) 5-325 MG tablet  Every 8 hours PRN     Discontinue  Reprint     11/17/19 [place], Estefany Goebel, MD 11/17/19 1234    Varney Biles, MD 11/17/19 1338

## 2019-11-17 NOTE — ED Triage Notes (Signed)
Per EMS, Pt has had sudden onset of chest pain, radiating into jaw.  Hx of WPW, Out of Metoprolol X1 week.  Sinus on monitor.  Extensive hx.    130/92 HR 90 RR 18 98% RA  4mg  Morphine given  8/10  To 5/10 No nirto given - took ED drugs today

## 2019-11-18 NOTE — Telephone Encounter (Signed)
Dr. Havery Moros, patient called the answering service on Sunday 7/11. He called to inform you he went to the [facility] ED due to having chest and epigastric pain. We did not get called for a GI consult. He was discharged home. Pls let Barbera Setters know what GI follow up you recommend.

## 2019-11-19 NOTE — Telephone Encounter (Signed)
Thanks Ross, He has chronic abdominal pain from his chronic pancreatitis and recently noted to have PUD which is being treated. Labs stable. He is scheduled for an MRCP this week which is the next step in his evaluation, he will be contacted once that has been completed. He can follow up with his PCP for chronic narcotics if that is what is needed to control his symptoms, our clinic does not prescribe that, he has been referred to pain management and that consult is pending. Thanks

## 2019-11-20 ENCOUNTER — Emergency Department (HOSPITAL_COMMUNITY)
Admission: EM | Admit: 2019-11-20 | Discharge: 2019-11-21 | Disposition: A | Discharge status: Home / Self Care | Payer: Medicaid Other | Attending: Emergency Medicine | Admitting: Emergency Medicine

## 2019-11-20 ENCOUNTER — Other Ambulatory Visit: Payer: Self-pay

## 2019-11-20 ENCOUNTER — Encounter (HOSPITAL_COMMUNITY): Payer: Self-pay

## 2019-11-20 DIAGNOSIS — J45909 Unspecified asthma, uncomplicated: Secondary | ICD-10-CM | POA: Diagnosis not present

## 2019-11-20 DIAGNOSIS — R0789 Other chest pain: Secondary | ICD-10-CM | POA: Diagnosis not present

## 2019-11-20 DIAGNOSIS — Z79899 Other long term (current) drug therapy: Secondary | ICD-10-CM | POA: Insufficient documentation

## 2019-11-20 DIAGNOSIS — R109 Unspecified abdominal pain: Secondary | ICD-10-CM | POA: Insufficient documentation

## 2019-11-20 DIAGNOSIS — K219 Gastro-esophageal reflux disease without esophagitis: Secondary | ICD-10-CM | POA: Diagnosis not present

## 2019-11-20 DIAGNOSIS — F17210 Nicotine dependence, cigarettes, uncomplicated: Secondary | ICD-10-CM | POA: Diagnosis not present

## 2019-11-20 DIAGNOSIS — Z91040 Latex allergy status: Secondary | ICD-10-CM | POA: Diagnosis not present

## 2019-11-20 DIAGNOSIS — I10 Essential (primary) hypertension: Secondary | ICD-10-CM | POA: Insufficient documentation

## 2019-11-20 DIAGNOSIS — G8929 Other chronic pain: Secondary | ICD-10-CM

## 2019-11-20 DIAGNOSIS — Z7951 Long term (current) use of inhaled steroids: Secondary | ICD-10-CM | POA: Diagnosis not present

## 2019-11-20 DIAGNOSIS — R110 Nausea: Secondary | ICD-10-CM | POA: Diagnosis present

## 2019-11-20 LAB — URINALYSIS, ROUTINE W REFLEX MICROSCOPIC
Bilirubin Urine: NEGATIVE
Glucose, UA: NEGATIVE mg/dL
Hgb urine dipstick: NEGATIVE
Ketones, ur: NEGATIVE mg/dL
Leukocytes,Ua: NEGATIVE
Nitrite: NEGATIVE
Protein, ur: NEGATIVE mg/dL
Specific Gravity, Urine: 1.003 — ABNORMAL LOW (ref 1.005–1.030)
pH: 6 (ref 5.0–8.0)

## 2019-11-20 LAB — CBC
HCT: 33.1 % — ABNORMAL LOW (ref 39.0–52.0)
Hemoglobin: 10.5 g/dL — ABNORMAL LOW (ref 13.0–17.0)
MCH: 27.3 pg (ref 26.0–34.0)
MCHC: 31.7 g/dL (ref 30.0–36.0)
MCV: 86 fL (ref 80.0–100.0)
Platelets: 179 10*3/uL (ref 150–400)
RBC: 3.85 MIL/uL — ABNORMAL LOW (ref 4.22–5.81)
RDW: 18.8 % — ABNORMAL HIGH (ref 11.5–15.5)
WBC: 5.7 10*3/uL (ref 4.0–10.5)
nRBC: 0 % (ref 0.0–0.2)

## 2019-11-20 LAB — LIPASE, BLOOD: Lipase: 21 U/L (ref 11–51)

## 2019-11-20 LAB — COMPREHENSIVE METABOLIC PANEL
ALT: 34 U/L (ref 0–44)
AST: 40 U/L (ref 15–41)
Albumin: 3.8 g/dL (ref 3.5–5.0)
Alkaline Phosphatase: 76 U/L (ref 38–126)
Anion gap: 13 (ref 5–15)
BUN: 6 mg/dL (ref 6–20)
CO2: 20 mmol/L — ABNORMAL LOW (ref 22–32)
Calcium: 9 mg/dL (ref 8.9–10.3)
Chloride: 107 mmol/L (ref 98–111)
Creatinine, Ser: 0.69 mg/dL (ref 0.61–1.24)
GFR calc Af Amer: >60 mL/min (ref >60)
GFR calc non Af Amer: >60 mL/min (ref >60)
Glucose, Bld: 77 mg/dL (ref 70–99)
Potassium: 3.9 mmol/L (ref 3.5–5.1)
Sodium: 140 mmol/L (ref 135–145)
Total Bilirubin: 0.2 mg/dL — ABNORMAL LOW (ref 0.3–1.2)
Total Protein: 7.9 g/dL (ref 6.5–8.1)

## 2019-11-20 LAB — TROPONIN I (HIGH SENSITIVITY)
Troponin I (High Sensitivity): 4 ng/L (ref <18)
Troponin I (High Sensitivity): 5 ng/L (ref <18)

## 2019-11-20 NOTE — ED Notes (Signed)
Pt approached sort desk asking where his name was on the list. Tech asked where he had been because he had missed vitals check, pt stated bathroom. Tech then asked if he had been outside because he had also missed a call for lab repeat, He stated no but pt has not been inside lobby

## 2019-11-20 NOTE — Telephone Encounter (Signed)
This information was used for patient assistance paperwork. That has been completed and faxed. Nothing further needed at this time.

## 2019-11-20 NOTE — ED Notes (Signed)
Pt called for repeat labs, no answer x1

## 2019-11-20 NOTE — ED Triage Notes (Signed)
Pt BIB [facility] for eval of chest pain and epigastric pain. Pt reports episode of vomiting x 2 prior to EMS arrival. No vomiting w/ EMS, 12 lead unremarkable. Pt seen for same 3 days prior.

## 2019-11-20 NOTE — ED Notes (Signed)
Pt did not respond when called for vitals recheck 

## 2019-11-21 ENCOUNTER — Other Ambulatory Visit: Payer: Self-pay

## 2019-11-21 DIAGNOSIS — R197 Diarrhea, unspecified: Secondary | ICD-10-CM

## 2019-11-21 DIAGNOSIS — Z8711 Personal history of peptic ulcer disease: Secondary | ICD-10-CM

## 2019-11-21 MED ORDER — LIDOCAINE VISCOUS HCL 2 % MT SOLN
15.0000 mL | Freq: Once | OROMUCOSAL | Status: AC
  Administered 2019-11-21: 15 mL via ORAL
  Filled 2019-11-21: qty 15

## 2019-11-21 MED ORDER — ALUM & MAG HYDROXIDE-SIMETH 200-200-20 MG/5ML PO SUSP
30.0000 mL | Freq: Once | ORAL | Status: AC
  Administered 2019-11-21: 30 mL via ORAL
  Filled 2019-11-21: qty 30

## 2019-11-21 MED ORDER — ONDANSETRON 4 MG PO TBDP
4.0000 mg | ORAL_TABLET | Freq: Once | ORAL | Status: AC
  Administered 2019-11-21: 4 mg via ORAL
  Filled 2019-11-21: qty 1

## 2019-11-21 NOTE — ED Notes (Signed)
Pt refused repeat VS reassessment

## 2019-11-21 NOTE — ED Notes (Signed)
Patient verbalizes understanding of discharge instructions. Opportunity for questioning and answers were provided. Armband removed by staff, pt discharged from ED ambulatory to home.  

## 2019-11-21 NOTE — ED Provider Notes (Signed)
[name]   CSN: [id number] Arrival date & time: 11/20/19  1950     History Chief Complaint  Patient presents with  . Abdominal Pain  . Chest Pain    [name] is a 49 y.o. male.  The history is provided by the patient and medical records.    49 y.o. M with hx of alcoholism, arthritis, chronic abdominal pain, GERD, HLP, PTSD, WPW, HTN, presenting to the ED for abdominal pain.  Patient reports he was started on fluconazole by his GI physician and has been taking as directed, however because of the loss of "burning" in his abdomen after he takes this.  States he was seen here few days ago and given "something that started with a D" and that helped.  States he has had some nausea without vomiting.  He has been able to drink gatorade and ensure.  Tried mashed potatoes but nausea got worse.  Denies diarrhea.  He is scheduled for MRCP on Friday with GI-- further care based on those results.  He was also referred to pain management, hopeful his follow-up with them will occur within the next month.  Past Medical History:  Diagnosis Date  . Alcoholism /alcohol abuse ([place])   . Anemia   . Anxiety   . Arthritis    "knees; arms; elbows" (03/26/2015)  . Asthma   . Bipolar disorder ([facility])   . Chronic bronchitis ([place])   . Chronic lower back pain   . Chronic pancreatitis ([place])   . Cocaine abuse ([place])   . Depression   . Family history of adverse reaction to anesthesia    "grandmother gets confused"  . Femoral condyle fracture ([place]) 03/08/2014   left medial/notes 03/09/2014  . GERD (gastroesophageal reflux disease)   . H/O hiatal hernia   . H/O suicide attempt 10/2012  . High cholesterol   . History of blood transfusion 10/2012   "when I tried to commit suicide"  . History of stomach ulcers   . Hypertension   . Marijuana abuse, continuous   . Migraine    "a few times/year" (03/26/2015)  . Pneumonia 1990's X 3  . PTSD  (post-traumatic stress disorder)   . Sickle cell trait ([place])   . WPW (Wolff-Parkinson-White syndrome)    [name] 03/06/2013    Patient Active Problem List   Diagnosis Date Noted  . AKI (acute kidney injury) ([place]) 11/13/2018  . Seizure ([place]) 11/13/2018  . Gastritis and gastroduodenitis   . Chest pain 01/08/2018  . GI bleed 11/24/2017  . Acute blood loss anemia 11/24/2017  . Atypical chest pain 11/24/2017  . Acute pancreatitis 09/28/2017  . Abdominal pain 05/27/2017  . Hematemesis 05/27/2017  . Tachycardia 03/18/2017  . Diarrhea 03/18/2017  . Acute on chronic pancreatitis ([place]) 12/17/2016  . Intractable nausea and vomiting 12/05/2016  . Verbally abusive behavior 12/05/2016  . Normocytic anemia 12/05/2016  . Alcohol use disorder, severe, dependence ([place]) 07/25/2016  . Cocaine use disorder, severe, dependence ([place]) 07/25/2016  . 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 Leukocytosis   . Hospital acquired PNA 05/20/2015  . Chronic pancreatitis ([place]) 05/18/2015  . Pseudocyst of pancreas 05/18/2015  . Polysubstance abuse (tobacco, cocaine, THC, and ETOH) 03/26/2015  . Alcohol-induced chronic pancreatitis ([place])   . Essential hypertension 02/06/2014  . Mood disorder ([place]) 02/06/2014  . Alcohol-induced acute pancreatitis 11/28/2013  . Pancreatic pseudocyst/cyst 11/25/2013  . Severe protein-calorie malnutrition ([place]) 10/10/2013  .  Suicide attempt ([place]) 10/08/2013  . 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 TOBACCO ABUSE 03/23/2007    Past Surgical History:  Procedure Laterality Date  . BIOPSY  11/25/2017   Procedure: BIOPSY;  Surgeon: Arta Silence, MD;  Location: [facility] [place] ENDOSCOPY;  Service: Endoscopy;;  . BIOPSY  10/14/2018   Procedure: BIOPSY;  Surgeon: Arta Silence, MD;  Location: [facility];  Service: Endoscopy;;  . CARDIAC CATHETERIZATION    . ESOPHAGOGASTRODUODENOSCOPY (EGD) WITH PROPOFOL N/A  11/25/2017   Procedure: ESOPHAGOGASTRODUODENOSCOPY (EGD) WITH PROPOFOL;  Surgeon: Arta Silence, MD;  Location: [facility];  Service: Endoscopy;  Laterality: N/A;  . ESOPHAGOGASTRODUODENOSCOPY (EGD) WITH PROPOFOL Left 10/14/2018   Procedure: ESOPHAGOGASTRODUODENOSCOPY (EGD) WITH PROPOFOL;  Surgeon: Arta Silence, MD;  [facility] ENDOSCOPY;  Service: Endoscopy;  Laterality: Left;  . ESOPHAGOGASTRODUODENOSCOPY (EGD) WITH PROPOFOL N/A 11/14/2018   Procedure: ESOPHAGOGASTRODUODENOSCOPY (EGD) WITH PROPOFOL;  Surgeon: Laurence Spates, MD;  Location: [facility] ENDOSCOPY;  Service: Gastroenterology;  Laterality: N/A;  . EYE SURGERY Left 1990's   "result of trauma"   . FACIAL FRACTURE SURGERY Left 1990's   "result of trauma"   . FRACTURE SURGERY    . HERNIA REPAIR    . LEFT HEART CATHETERIZATION WITH CORONARY ANGIOGRAM Right 03/07/2013   Procedure: LEFT HEART CATHETERIZATION WITH CORONARY ANGIOGRAM;  Surgeon: [name], MD;  Location: [facility] CATH LAB;  Service: Cardiovascular;  Laterality: Right;  . UMBILICAL HERNIA REPAIR    . UPPER GASTROINTESTINAL ENDOSCOPY         Family History  Problem Relation Age of Onset  . Hypertension Mother   . Cirrhosis Mother   . Alcoholism Mother   . Hypertension Father   . Melanoma Father   . Hypertension Other   . Coronary artery disease Other     Social History   Tobacco Use  . Smoking status: Current Every Day Smoker    Packs/day: 1.00    Years: 33.00    Pack years: 33.00    Types: Cigarettes, E-cigarettes  . Smokeless tobacco: Never Used  Vaping Use  . Vaping Use: Former  Substance Use Topics  . Alcohol use: Yes  . Drug use: Yes    Types: Marijuana, Cocaine    Comment: daily marijuana use; last cocaine use about 3 months ago    Home Medications Prior to Admission medications   Medication Sig Start Date End Date Taking? Authorizing Provider  acetaminophen (TYLENOL) 500 MG tablet Take 2 tablets (1,000 mg total) by mouth every 8 (eight) hours  as needed for mild pain. 08/29/19   Elodia Florence., MD  albuterol (VENTOLIN HFA) 108 (90 Base) MCG/ACT inhaler Inhale 2 puffs into the lungs every 4 (four) hours as needed for wheezing or shortness of breath. 11/12/19   [name], DO  chlordiazePOXIDE (LIBRIUM) 10 MG capsule Take 1 capsule (10 mg total) by mouth 3 (three) times daily as needed for anxiety or withdrawal. Patient not taking: Reported on 11/13/2019 09/06/19   Fulp, Cammie, MD  Cyanocobalamin (VITAMIN B-12 PO) Take 1 tablet by mouth daily.    [provider]  cyclobenzaprine (FLEXERIL) 10 MG tablet Take 1 tablet (10 mg total) by mouth 2 (two) times daily as needed for muscle spasms. 03/03/19   Recardo Evangelist, PA-C  dexlansoprazole (DEXILANT) 60 MG capsule Take 1 capsule (60 mg total) by mouth daily. 11/13/19   [name], [name], MD  famotidine (PEPCID) 20 MG tablet Take 1 tablet (20 mg total) by mouth 2 (two)  times daily. 08/08/19   Carlisle Cater, PA-C  fluconazole (DIFLUCAN) 200 MG tablet Take 400 mg on day 1, then take 200 mg a day for the next 13 days Patient taking differently: Take 200 mg by mouth daily.  11/13/19   [name], [name], MD  fluticasone furoate-vilanterol (BREO ELLIPTA) 200-25 MCG/INH AEPB Inhale 1 puff into the lungs daily. 11/12/19   Noemi Chapel P, DO  fluticasone furoate-vilanterol (BREO ELLIPTA) 200-25 MCG/INH AEPB Inhale 1 puff into the lungs daily. 11/12/19   [name], DO  folic acid (FOLVITE) 1 MG tablet Take 1 tablet (1 mg total) by mouth daily. 11/26/17   [name], MD  hydrALAZINE (APRESOLINE) 25 MG tablet Take 1 tablet (25 mg total) by mouth 3 (three) times daily. 09/09/19   Fulp, Cammie, MD  hydrOXYzine (VISTARIL) 25 MG capsule Take 25 mg by mouth 3 (three) times daily as needed for anxiety.  06/19/19   [provider]  lidocaine (XYLOCAINE) 2 % solution USE AS DIRECTED( 15 ML IN THE MOUTH OR THROAT AS NEEDED FOR MOUTH PAIN) Patient taking differently: Use as directed 15  mLs in the mouth or throat as needed for mouth pain.  11/12/19   Fulp, Cammie, MD  lipase/protease/amylase (CREON) 36000 UNITS CPEP capsule Take 2 capsules (72,000 Units total) by mouth 3 (three) times daily with meals. For pancreatitis 11/05/19   [name], [name], MD  loperamide (IMODIUM A-D) 2 MG tablet Take 2 mg by mouth 4 (four) times daily as needed for diarrhea or loose stools.    [provider]  LORazepam (ATIVAN) 0.5 MG tablet Take 0.5 mg by mouth every 8 (eight) hours as needed for anxiety.     [provider]  metoprolol tartrate (LOPRESSOR) 100 MG tablet Take 1 tablet (100 mg total) by mouth 2 (two) times daily. To lower blood pressure 09/09/19   Fulp, Cammie, MD  Multiple Vitamin (MULTIVITAMIN WITH MINERALS) TABS tablet Take 1 tablet by mouth daily. 09/06/17   Bonnell Public, MD  NASONEX 50 MCG/ACT nasal spray Place 2 sprays into the nose daily. 06/19/19   [provider]  ondansetron (ZOFRAN ODT) 4 MG disintegrating tablet Take 1 tablet (4 mg total) by mouth every 8 (eight) hours as needed for nausea or vomiting. 10/06/19   Lavone Orn, Kathleene Hazel, PA  pantoprazole (PROTONIX) 40 MG tablet Take 1 tablet (40 mg total) by mouth 2 (two) times daily. Patient not taking: Reported on 11/17/2019 11/05/19   Yetta Flock, MD  potassium chloride SA (K-DUR) 20 MEQ tablet Take 1 tablet (20 mEq total) by mouth daily. Patient taking differently: Take 20 mEq by mouth daily as needed (potassium).  01/07/19   Sherwood Gambler, MD  QUEtiapine (SEROQUEL) 50 MG tablet Take 50 mg by mouth at bedtime.    [provider]  sertraline (ZOLOFT) 100 MG tablet Take 1 tablet (100 mg total) by mouth daily. Patient taking differently: Take 150 mg by mouth daily.  08/29/17   Clent Demark, PA-C  sucralfate (CARAFATE) 1 g tablet Take 1 tablet (1 g total) by mouth 4 (four) times daily -  with meals and at bedtime. 11/05/19   [name], [name], MD  thiamine 100 MG tablet Take 1  tablet (100 mg total) by mouth daily. 10/16/18   [name], MD  traMADol (ULTRAM) 50 MG tablet Take 1 tablet (50 mg total) by mouth every 8 (eight) hours as needed for severe pain. 11/14/19   [name], [name],  MD  amitriptyline (ELAVIL) 25 MG tablet Take 1 tablet (25 mg total) by mouth at bedtime. Patient not taking: Reported on 08/08/2019 10/15/18 08/08/19  [name], MD  gabapentin (NEURONTIN) 100 MG capsule Take 1 capsule (100 mg total) by mouth 2 (two) times daily. Patient not taking: Reported on 08/08/2019 10/15/18 08/08/19  [name], MD  promethazine (PHENERGAN) 25 MG tablet Take 1 tablet (25 mg total) by mouth every 6 (six) hours as needed for nausea or vomiting. Patient not taking: Reported on 08/08/2019 03/02/19 08/08/19  Kinnie Feil, PA-C    Allergies    Robaxin [methocarbamol], Shellfish-derived products, Trazodone, Trazodone and nefazodone, Adhesive [tape], Latex, Toradol [ketorolac tromethamine], Contrast media [iodinated diagnostic agents], and Reglan [metoclopramide]  Review of Systems   Review of Systems  Gastrointestinal: Positive for abdominal pain.  All other systems reviewed and are negative.   Physical Exam Updated Vital Signs BP 127/90 (BP Location: Right Arm)   Pulse 93   Temp 98.1 F (36.7 C) (Oral)   Resp 18   SpO2 100%   Physical Exam Vitals and nursing Moran reviewed.  Constitutional:      Appearance: He is well-developed.     Comments: Sleeping, once awoken for exam he began moaning in pain  HENT:     Head: Normocephalic and atraumatic.  Eyes:     Conjunctiva/sclera: Conjunctivae normal.     Pupils: Pupils are equal, round, and reactive to light.  Cardiovascular:     Rate and Rhythm: Normal rate and regular rhythm.     Heart sounds: Normal heart sounds.  Pulmonary:     Effort: Pulmonary effort is normal.     Breath sounds: Normal breath sounds.  Abdominal:     General: Bowel sounds are normal.     Palpations: Abdomen is soft.      Tenderness: There is no abdominal tenderness. There is no guarding or rebound.  Musculoskeletal:        General: Normal range of motion.     Cervical back: Normal range of motion.  Skin:    General: Skin is warm and dry.  Neurological:     Mental Status: He is alert and oriented to person, place, and time.     ED Results / Procedures / Treatments   Labs (all labs ordered are listed, but only abnormal results are displayed) Labs Reviewed  COMPREHENSIVE METABOLIC PANEL - Abnormal; Notable for the following components:      Result Value   CO2 20 (*)    Total Bilirubin 0.2 (*)    All other components within normal limits  CBC - Abnormal; Notable for the following components:   RBC 3.85 (*)    Hemoglobin 10.5 (*)    HCT 33.1 (*)    RDW 18.8 (*)    All other components within normal limits  URINALYSIS, ROUTINE W REFLEX MICROSCOPIC - Abnormal; Notable for the following components:   Color, Urine COLORLESS (*)    APPearance HAZY (*)    Specific Gravity, Urine 1.003 (*)    All other components within normal limits  LIPASE, BLOOD  TROPONIN I (HIGH SENSITIVITY)  TROPONIN I (HIGH SENSITIVITY)    EKG EKG Interpretation  Date/Time:  Wednesday November 20 2019 19:56:37 EDT Ventricular Rate:  91 PR Interval:  104 QRS Duration: 68 QT Interval:  374 QTC Calculation: 460 R Axis:   80 Text Interpretation: Sinus rhythm with short PR Right atrial enlargement ST & T wave abnormality, consider lateral ischemia Abnormal  ECG Confirmed by Pryor Curia [phone]) on 11/21/2019 4:37:00 AM   Radiology No results found.  Procedures Procedures (including critical care time)  Medications Ordered in ED Medications  alum & mag hydroxide-simeth (MAALOX/MYLANTA) 200-200-20 MG/5ML suspension 30 mL (30 mLs Oral Given 11/21/19 0606)    And  lidocaine (XYLOCAINE) 2 % viscous mouth solution 15 mL (15 mLs Oral Given 11/21/19 0606)  ondansetron (ZOFRAN-ODT) disintegrating tablet 4 mg (4 mg Oral Given 11/21/19  8675)    ED Course  I have reviewed the triage vital signs and the nursing notes.  Pertinent labs & imaging results that were available during my care of the patient were reviewed by me and considered in my medical decision making (see chart for details).    MDM Rules/Calculators/A&P  49 year old male well-known to this facility here with abdominal pain.  This is a chronic issue for him.  States he has burning pain in his stomach, worse after taking his fluconazole that was started by his GI physician.  He is scheduled for MRCP tomorrow, further treatment options depending on results.  He is also recently been referred to pain management.  He is afebrile and nontoxic in appearance here.  He is actually sleeping when I entered the room but once awoken for exam he began clutching his abdomen and moaning.  His labs are reassuring.  Troponin negative x2.  I feel that this is likely his chronic abdominal pain, I do not feel he needs any emergent imaging at this time.  He was given GI cocktail and dose of Zofran.  He will follow-up with GI for his MRCP tomorrow.  He may return here for any new/acute changes.  Final Clinical Impression(s) / ED Diagnoses Final diagnoses:  Chronic abdominal pain    Rx / DC Orders ED Discharge Orders    None       Larene Pickett, PA-C 11/21/19 Bradley, Delice Bison, DO 11/21/19 [phone]

## 2019-11-21 NOTE — Discharge Instructions (Signed)
Follow-up with GI physician tomorrow for your MRCP. Continue all your usual medications. Return here for new concerns.

## 2019-11-21 NOTE — ED Notes (Signed)
Breakfast Ordered--[name]  

## 2019-11-22 ENCOUNTER — Other Ambulatory Visit: Payer: Self-pay | Admitting: Family Medicine

## 2019-11-22 ENCOUNTER — Other Ambulatory Visit: Payer: Self-pay

## 2019-11-22 ENCOUNTER — Ambulatory Visit (HOSPITAL_COMMUNITY)
Admission: RE | Admit: 2019-11-22 | Discharge: 2019-11-22 | Disposition: A | Discharge status: Home / Self Care | Payer: Medicaid Other | Source: Ambulatory Visit | Attending: Gastroenterology | Admitting: Gastroenterology

## 2019-11-22 ENCOUNTER — Other Ambulatory Visit: Payer: Self-pay | Admitting: Gastroenterology

## 2019-11-22 ENCOUNTER — Telehealth: Payer: Self-pay | Admitting: Gastroenterology

## 2019-11-22 DIAGNOSIS — R634 Abnormal weight loss: Secondary | ICD-10-CM | POA: Insufficient documentation

## 2019-11-22 DIAGNOSIS — R933 Abnormal findings on diagnostic imaging of other parts of digestive tract: Secondary | ICD-10-CM | POA: Insufficient documentation

## 2019-11-22 DIAGNOSIS — K2920 Alcoholic gastritis without bleeding: Secondary | ICD-10-CM

## 2019-11-22 MED ORDER — GADOBUTROL 1 MMOL/ML IV SOLN
5.0000 mL | Freq: Once | INTRAVENOUS | Status: AC | PRN
  Administered 2019-11-22: 5 mL via INTRAVENOUS

## 2019-11-22 NOTE — Telephone Encounter (Signed)
Requested medication (s) are due for refill today:yes  Requested medication (s) are on the active medication list: yes  Last refill:  Protonix by historic provider (GI),  lidocaine last ordered 11/12/19  Future visit scheduled: yes  Notes to clinic: lidocaine off protocol, Protonix GI provider and Mar sates patient reports not taking.    Requested Prescriptions  Pending Prescriptions Disp Refills   lidocaine (XYLOCAINE) 2 % solution 150 mL 0    Sig: USE AS DIRECTED( 15 ML IN THE MOUTH OR THROAT AS NEEDED FOR MOUTH PAIN)      Off-Protocol Failed - 11/22/2019  2:11 PM      Failed - Medication not assigned to a protocol, review manually.      Passed - Valid encounter within last 12 months    Recent Outpatient Visits           1 month ago Poor dentition   [place], [place], [place]   2 months ago Alcohol-induced chronic pancreatitis [facility])   [place] Fulp, [place], MD   2 years ago Chronic abdominal pain   [place] Clent Demark, PA-C   3 years ago Syncope, unspecified syncope type   [place], [place]   4 years ago Alcohol-induced acute pancreatitis   [place], Marlena Clipper, MD       Future Appointments             In 6 days Camillia Herter, NP [place]              pantoprazole (PROTONIX) 40 MG tablet 60 tablet 3    Sig: Take 1 tablet (40 mg total) by mouth 2 (two) times daily.      Gastroenterology: Proton Pump Inhibitors Passed - 11/22/2019  2:11 PM      Passed - Valid encounter within last 12 months    Recent Outpatient Visits           1 month ago Poor dentition   [place], [place], [place]   2 months ago Alcohol-induced chronic pancreatitis [facility])   [place] Fulp, [place],  MD   2 years ago Chronic abdominal pain   [place] Clent Demark, PA-C   3 years ago Syncope, unspecified syncope type   [place], [place]   4 years ago Alcohol-induced acute pancreatitis   [place], MD       Future Appointments             In 6 days Camillia Herter, NP [place]

## 2019-11-22 NOTE — Telephone Encounter (Signed)
Patient requesting lidocaine (XYLOCAINE) 2 % solution to help soothe espogaus and for mouth pain, patient will run out over the weekend and would like rx sent in today.  Patient also requesting pantoprazole (PROTONIX) 40 MG tablet, informed patient please allow 48 to 72 hour turn around time    [name], [place] Phone:  [phone]  Fax:  [phone]

## 2019-11-22 NOTE — Telephone Encounter (Signed)
I left a message for the patient that we will call with results once reviewed by Dr. Havery Moros.

## 2019-11-25 ENCOUNTER — Other Ambulatory Visit: Payer: Self-pay | Admitting: Family

## 2019-11-25 ENCOUNTER — Ambulatory Visit: Payer: Self-pay | Admitting: *Deleted

## 2019-11-25 ENCOUNTER — Other Ambulatory Visit: Payer: Self-pay | Admitting: Family Medicine

## 2019-11-25 ENCOUNTER — Telehealth: Payer: Self-pay

## 2019-11-25 NOTE — Telephone Encounter (Signed)
Requested medication (s) are due for refill today: yes- Called pt and he stated he ran out last night  Requested medication (s) are on the active medication list: yes  Last refill:  11/12/19  Future visit scheduled: yes  Notes to clinic:  Medication had previously been refused "refill requested too soon"- but pt is out and need the refill This medication not assigned to a protocol and needs to be reviewed manually   Requested Prescriptions  Pending Prescriptions Disp Refills   lidocaine (XYLOCAINE) 2 % solution 150 mL 0      Off-Protocol Failed - 11/25/2019  9:24 AM      Failed - Medication not assigned to a protocol, review manually.      Passed - Valid encounter within last 12 months    Recent Outpatient Visits           1 month ago Poor dentition   [place], [place], [place]   2 months ago Alcohol-induced chronic pancreatitis [facility])   [place] Fulp, [place], MD   2 years ago Chronic abdominal pain   [place] Clent Demark, PA-C   3 years ago Syncope, unspecified syncope type   [place], [place]   4 years ago Alcohol-induced acute pancreatitis   [place], MD       Future Appointments             In 3 days Camillia Herter, NP [place]

## 2019-11-25 NOTE — Telephone Encounter (Signed)
Pt states saw [facility] Dr. Sangaree Cellar, had endoscopy and colonoscopy done. States was told he needs additional "MRI Endoscopy" done. Also reports he has referral to pain clinic but is waiting on Medicaid to come through. States he was advised by GI to contact PCP for pain med, requesting Oxycodone. States Dr. Chapman Fitch has been aware of problems, "chronic pancreatitis."  States pain has been progressively  worsening "Which is why I am having all these tests."  Pt  Providing Dr. Doyne Keel  number "So they can coordinate." [phone]  [name] number [phone]. Please advise

## 2019-11-25 NOTE — Telephone Encounter (Signed)
Patient is calling to request if Amy Minette Brine can contact GI office to coordinated care because patient has chronic pancreatis. And is requesting some pain medication. CB for GI Office- Dr Noemi Chapel [facility] [phone] [facility] Patient [phone]

## 2019-11-25 NOTE — Telephone Encounter (Signed)
Medication Refill - Medication: lidocaine (XYLOCAINE) 2 % solution    Preferred Pharmacy (with phone number or street name):  [place], [address] Phone:  [phone]  Fax:  [phone]       Agent: Please be advised that RX refills may take up to 3 business days. We ask that you follow-up with your pharmacy.

## 2019-11-26 ENCOUNTER — Other Ambulatory Visit: Payer: Self-pay

## 2019-11-26 DIAGNOSIS — R197 Diarrhea, unspecified: Secondary | ICD-10-CM

## 2019-11-26 DIAGNOSIS — Z8711 Personal history of peptic ulcer disease: Secondary | ICD-10-CM

## 2019-11-27 ENCOUNTER — Other Ambulatory Visit: Payer: Self-pay

## 2019-11-27 DIAGNOSIS — R634 Abnormal weight loss: Secondary | ICD-10-CM

## 2019-11-27 DIAGNOSIS — K860 Alcohol-induced chronic pancreatitis: Secondary | ICD-10-CM

## 2019-11-28 ENCOUNTER — Ambulatory Visit: Payer: Self-pay | Attending: Family | Admitting: Family

## 2019-11-28 ENCOUNTER — Other Ambulatory Visit: Payer: Self-pay

## 2019-11-28 ENCOUNTER — Telehealth: Payer: Self-pay | Admitting: Gastroenterology

## 2019-11-28 ENCOUNTER — Encounter: Payer: Self-pay | Admitting: Family

## 2019-11-28 VITALS — BP 142/91 | HR 103 | Resp 16 | Ht 69.0 in | Wt 116.0 lb

## 2019-11-28 DIAGNOSIS — K2920 Alcoholic gastritis without bleeding: Secondary | ICD-10-CM

## 2019-11-28 DIAGNOSIS — R1013 Epigastric pain: Secondary | ICD-10-CM

## 2019-11-28 DIAGNOSIS — I10 Essential (primary) hypertension: Secondary | ICD-10-CM

## 2019-11-28 DIAGNOSIS — F1011 Alcohol abuse, in remission: Secondary | ICD-10-CM

## 2019-11-28 DIAGNOSIS — H00011 Hordeolum externum right upper eyelid: Secondary | ICD-10-CM

## 2019-11-28 DIAGNOSIS — F419 Anxiety disorder, unspecified: Secondary | ICD-10-CM

## 2019-11-28 DIAGNOSIS — F1911 Other psychoactive substance abuse, in remission: Secondary | ICD-10-CM

## 2019-11-28 DIAGNOSIS — K860 Alcohol-induced chronic pancreatitis: Secondary | ICD-10-CM

## 2019-11-28 MED ORDER — HYDROXYZINE PAMOATE 25 MG PO CAPS: 25.0000 mg | ORAL_CAPSULE | Freq: Three times a day (TID) | ORAL | 3 refills | Status: DC | PRN

## 2019-11-28 MED ORDER — QUETIAPINE FUMARATE 50 MG PO TABS: 50.0000 mg | ORAL_TABLET | Freq: Every day | ORAL | 0 refills | Status: DC

## 2019-11-28 MED ORDER — SERTRALINE HCL 100 MG PO TABS: 100.0000 mg | ORAL_TABLET | Freq: Every day | ORAL | 3 refills | Status: DC

## 2019-11-28 NOTE — Telephone Encounter (Signed)
Left message for patient to call back  

## 2019-11-28 NOTE — Telephone Encounter (Signed)
Pt is requesting a call back from a nurse to discuss his pcp issue. Pt states he would like to know if Dr Priscille Kluver could find him a pcp that will work with him so he can obtain his oxycodone.

## 2019-11-28 NOTE — Patient Instructions (Addendum)
Keep appointments with Gastroenterology as scheduled. Metoprolol and Hydralazine for high blood pressure. Seroquel, Zoloft, and Hydroxyzine for anxiety/depression. Follow-up with primary physician in 4 months or sooner if needed. Chronic Pancreatitis  Chronic pancreatitis is long-lasting inflammation and scarring of the pancreas. The pancreas is a gland that is located behind the stomach. It makes enzymes that help to digest food. The pancreas also releases hormones called glucagon and insulin, which help regulate blood sugar (glucose). Damage to the pancreas may affect digestion, cause pain in the upper abdomen and back, and cause diabetes. Inflammation can also irritate other organs in the abdomen near the pancreas. At first, pancreatitis may be sudden (acute). If you have several or prolonged episodes of acute pancreatitis, the condition can turn into chronic pancreatitis. What are the causes? The most common cause of this condition is alcohol abuse. Other causes include:  High (elevated) levels of triglycerides in the blood (hypertriglyceridemia).  Gallstones or other conditions that can block the tube that drains the pancreas (pancreatic duct).  Pancreatic cancer.  Cystic fibrosis.  Too much calcium in the blood (hypercalcemia), which may be caused by an overactive parathyroid gland (hyperparathyroidism).  Certain medicines.  Injury to the pancreas.  Infection.  Autoimmune pancreatitis. This is when the body's disease-fighting (immune) system attacks the pancreas.  Genes that are passed from parent to child (inherited). In some cases, the cause may not be known. What increases the risk? This condition is more likely to develop in:  Men.  People who are 35-47 years old.  People who have a family history of pancreatitis.  People who smoke tobacco.  People who drink large amounts of alcohol over a long period of time. What are the signs or symptoms? Symptoms of this  condition may include:  Pain in the abdomen or upper back. Pain may get worse after eating.  Nausea and vomiting.  Fever.  Weight loss.  A change in the color and consistency of bowel movements, such as stools that are oily, fatty, or clay-colored. How is this diagnosed? This condition is diagnosed based on your symptoms, your medical history, and a physical exam. You may have tests, such as:  Blood tests.  Stool samples.  Biopsy of the pancreas. This is the removal of a small amount of pancreas tissue to be tested in a lab.  Imaging tests, such as: ? X-rays. ? CT scan. ? MRI. ? Ultrasound. How is this treated? You may need to be treated at a hospital. Treatment may involve:  Resting the pancreas. You may need to stop eating and drinking for a few days to give your pancreas time to recover. During this time, you will be given IV fluids to keep you hydrated.  Controlling pain. You may be given pain medicines by mouth (orally) or as injections.  Improving digestion. You may be given: ? Medicines to replace your pancreatic enzymes. ? Vitamin supplements. ? A specific diet to follow. You may work with a diet and nutrition specialist (dietitian) to make an eating plan.  Surgery to: ? Clear the pancreatic ducts of any blockages, such as gallstones. ? Remove any fluid or damaged tissue from the pancreas. Other treatments may include:  Preventing diabetes. Your health care provider may recommend that you: ? Get regular screening tests for diabetes. ? Monitor your blood glucose regularly.  Lifestyle changes, such as stopping alcohol use.  Steroid medicines, if your condition is caused by your immune system attacking your body's own tissues (autoimmune disease). Follow these instructions  at home: Eating and drinking      Do not drink alcohol. If you need help quitting, ask your health care provider.  Follow a diet as told by your health care provider or dietitian, if  this applies. This may include: ? Limiting how much fat you eat. ? Eating smaller meals more often. ? Avoiding caffeine.  Drink enough fluid to keep your urine pale yellow. General instructions  Take over-the-counter and prescription medicines only as told by your health care provider. These include vitamin supplements.  Do not drive or use heavy machinery while taking prescription pain medicine.  If you are taking prescription pain medicine, take actions to prevent or treat constipation. Your health care provider may recommend that you: ? Take an over-the-counter or prescription medicine for constipation. ? Eat foods that are high in fiber such as whole grains and beans. ? Limit foods that are high in fat and processed sugars, such as fried or sweet foods.  Do not use any products that contain nicotine or tobacco, such as cigarettes and e-cigarettes. If you need help quitting, ask your health care provider.  If recommended by your health care provider, monitor your blood glucose at home.  Keep all follow-up visits as told by your health care provider. This is important. Contact a health care provider if:  You have pain that does not get better with medicine.  You have a fever.  You have sudden weight loss. Get help right away if:  Your pain suddenly gets worse.  You have sudden swelling in your abdomen.  You start to vomit often.  You vomit blood.  You have diarrhea that does not go away.  You have blood in your stool.  You become confused or you have trouble thinking clearly. Summary  Chronic pancreatitis is long-lasting inflammation and scarring of the pancreas. Damage to the pancreas may affect digestion, cause pain in the upper abdomen and back, and cause diabetes. Inflammation can also irritate other organs in the abdomen near the pancreas.  Common causes of this condition are alcohol abuse, gallstones, high (elevated) levels of triglycerides, and certain  medicines.  This condition is sometimes treated at a hospital and may involve resting the pancreas, controlling pain, replacing enzymes, and avoiding alcohol. This information is not intended to replace advice given to you by your health care provider. Make sure you discuss any questions you have with your health care provider. Document Revised: 11/29/2018 Document Reviewed: 12/23/2016 [employer] Patient Education  [place].

## 2019-11-28 NOTE — Telephone Encounter (Signed)
Amy - Please see phone note on 11-14-19 and 11-17-19 and Office Note from 11-05-19 with Dr. Havery Moros.

## 2019-11-28 NOTE — Telephone Encounter (Signed)
Patient called states he spoke with Dr. Havery Moros about refilling his oxycodone medication for him but the doctor recommended he get it from PCP. The patient is saying that he would like for [place] to call the PCP to explain that we can not fill [phone] Dr. Gypsy Balsam. He currently  Is at the doctors office for a visit. He also said he did not want Dr. Gerilyn Nestle to think he was lying.
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
## 2019-11-29 LAB — GASTRIN: Gastrin: 1635 pg/mL — ABNORMAL HIGH (ref <=100)

## 2019-12-02 ENCOUNTER — Other Ambulatory Visit: Payer: Self-pay

## 2019-12-02 DIAGNOSIS — Z8711 Personal history of peptic ulcer disease: Secondary | ICD-10-CM

## 2019-12-02 DIAGNOSIS — K860 Alcohol-induced chronic pancreatitis: Secondary | ICD-10-CM

## 2019-12-02 DIAGNOSIS — E164 Increased secretion of gastrin: Secondary | ICD-10-CM

## 2019-12-02 NOTE — Telephone Encounter (Signed)
Left message for patient to call back  

## 2019-12-03 ENCOUNTER — Telehealth: Payer: Self-pay | Admitting: Gastroenterology

## 2019-12-03 NOTE — Telephone Encounter (Signed)
[name] looks like you tried to call this pt yesterday.

## 2019-12-03 NOTE — Telephone Encounter (Signed)
Patient is working with his PCP on pain management.

## 2019-12-03 NOTE — Telephone Encounter (Signed)
See other phone notes for details.  

## 2019-12-03 NOTE — Telephone Encounter (Signed)
Pt is requesting a call back from a nurse since he missed a phone call yesterday

## 2019-12-18 ENCOUNTER — Telehealth: Payer: Self-pay

## 2019-12-18 ENCOUNTER — Other Ambulatory Visit: Payer: Self-pay

## 2019-12-18 ENCOUNTER — Telehealth: Payer: Self-pay | Admitting: Gastroenterology

## 2019-12-18 DIAGNOSIS — Z8711 Personal history of peptic ulcer disease: Secondary | ICD-10-CM

## 2019-12-18 DIAGNOSIS — E164 Increased secretion of gastrin: Secondary | ICD-10-CM

## 2019-12-18 DIAGNOSIS — I10 Essential (primary) hypertension: Secondary | ICD-10-CM

## 2019-12-18 DIAGNOSIS — K860 Alcohol-induced chronic pancreatitis: Secondary | ICD-10-CM

## 2019-12-18 MED ORDER — LIDOCAINE VISCOUS HCL 2 % MT SOLN: OROMUCOSAL | 0 refills | Status: DC

## 2019-12-18 MED ORDER — METOPROLOL TARTRATE 100 MG PO TABS: 100.0000 mg | ORAL_TABLET | Freq: Two times a day (BID) | ORAL | 2 refills | Status: DC

## 2019-12-18 NOTE — Telephone Encounter (Signed)
[facility] told pt to see PCP for pain meds for procedure per pt. Please advise.

## 2019-12-18 NOTE — Telephone Encounter (Signed)
1) Medication(s) Requested (by name): metropolol & lidocaine viscous 2: solution  2) Pharmacy of Choice: [facility]  3) Special Requests:  [phone] pt   Approved medications will be sent to the pharmacy, we will reach out if there is an issue.  Requests made after 3pm may not be addressed until the following business day!  If a patient is unsure of the name of the medication(s) please note and ask patient to call back when they are able to provide all info, do not send to responsible party until all information is available!

## 2019-12-18 NOTE — Telephone Encounter (Signed)
I just reviewed his chart and there is doocumentation that [facility] left a message for him to give them a call regarding his pain after he had called this Clinic.

## 2019-12-18 NOTE — Telephone Encounter (Signed)
Lm on vm for patient to return call 

## 2019-12-18 NOTE — Telephone Encounter (Signed)
Rx sent 

## 2019-12-19 LAB — CHROMOGRANIN A: Chromogranin A (ng/mL): 1296 ng/mL — ABNORMAL HIGH (ref 0.0–101.8)

## 2019-12-20 ENCOUNTER — Encounter: Payer: Self-pay | Admitting: Gastroenterology

## 2019-12-20 NOTE — Telephone Encounter (Signed)
Chart reviewed  Unable to do medication management for this issue over phone  Advise he be seen in urgent care if sxs persist, intolerable, worse  Remind that total abstinence from EtOH necessary and if he has been drinking could be contributing to flare of pain and diarrhea

## 2019-12-20 NOTE — Telephone Encounter (Signed)
Pt called back and stated that he forgot to mention that this morning when he woke up he had a bowel movement and didn't even know - pt states he had another episode of fecal incontinence this afternoon and had to go back home. Pt states that he is also taking Imodium AD and this has not helped either. Please advise, thanks

## 2019-12-20 NOTE — Telephone Encounter (Signed)
Lm on vm for patient to return call 

## 2019-12-20 NOTE — Progress Notes (Signed)
Called by patient this evening. He's a patient of Dr. Doyne Keel. He has a history of chronic pancreatitis. He has a dilated pancreatic duct and cystic lesions of the pancreas. He is awaiting endoscopic ultrasound in the coming weeks. Patient describes progressive of abdominal pain that is generalized throughout his abdomen. He has associated decreased appetite. No coffee ground emesis or hematemesis.  He describes new symptoms of  rust colored stool as well as diarrhea. He has progressive fatigue. He's asking next steps in evaluation as well as potentially needed for pain medication management. I directed him to the emergency room for further evaluation. [place] [facility] service available for questions if necessary and available for a consultation tomorrow should the patient need consultation.  I am not available for pain medication management or opioid therapy over  the course of the weekend. Previous documentation is in the chart in regards to need for pain clinic referral.

## 2019-12-20 NOTE — Telephone Encounter (Signed)
Dr. Carlean Purl as DOD 12/20/19 [name] patient hx of chronic pancreatitis, diarrhea, abdominal pain Pt reports abdominal pain starting Wed night - LLQ and right middle pain, achy pain with burning - every once in a while sharp pain that shoots to his back, diarrhea started about 3 am - green to a maroon color, pt reports taking all meds as directed, reports pain at a 6.5/10. Pt referred to pain management by PCP but pt states that he has to come up with the first payment of $1,000 or have insurance before he can be seen, PCP will not prescribe pain meds. Please advise, thank you

## 2019-12-23 NOTE — Telephone Encounter (Signed)
Spoke with patient this morning, advised that we are unable to do medication management for this issue over the phone, pt denies any blood, still reporting diarrhea and abdominal pain - pt advised that he needed to visit an urgent care to be evaluated at this time since his symptoms are still persistent. Pt advised that total abstinence from alcohol is necessary as it can cause flare of pain and diarrhea, pt states that he has not been drinking any alcohol - pt states that he will go to an urgent care to be seen

## 2019-12-26 IMAGING — DX DG CHEST 2V
2 series · 2 of 2 positions shown · non-contrast
Comparison: 03/05/2017

CLINICAL DATA: Chest pain

EXAM:
CHEST  2 VIEW

[chest pa]
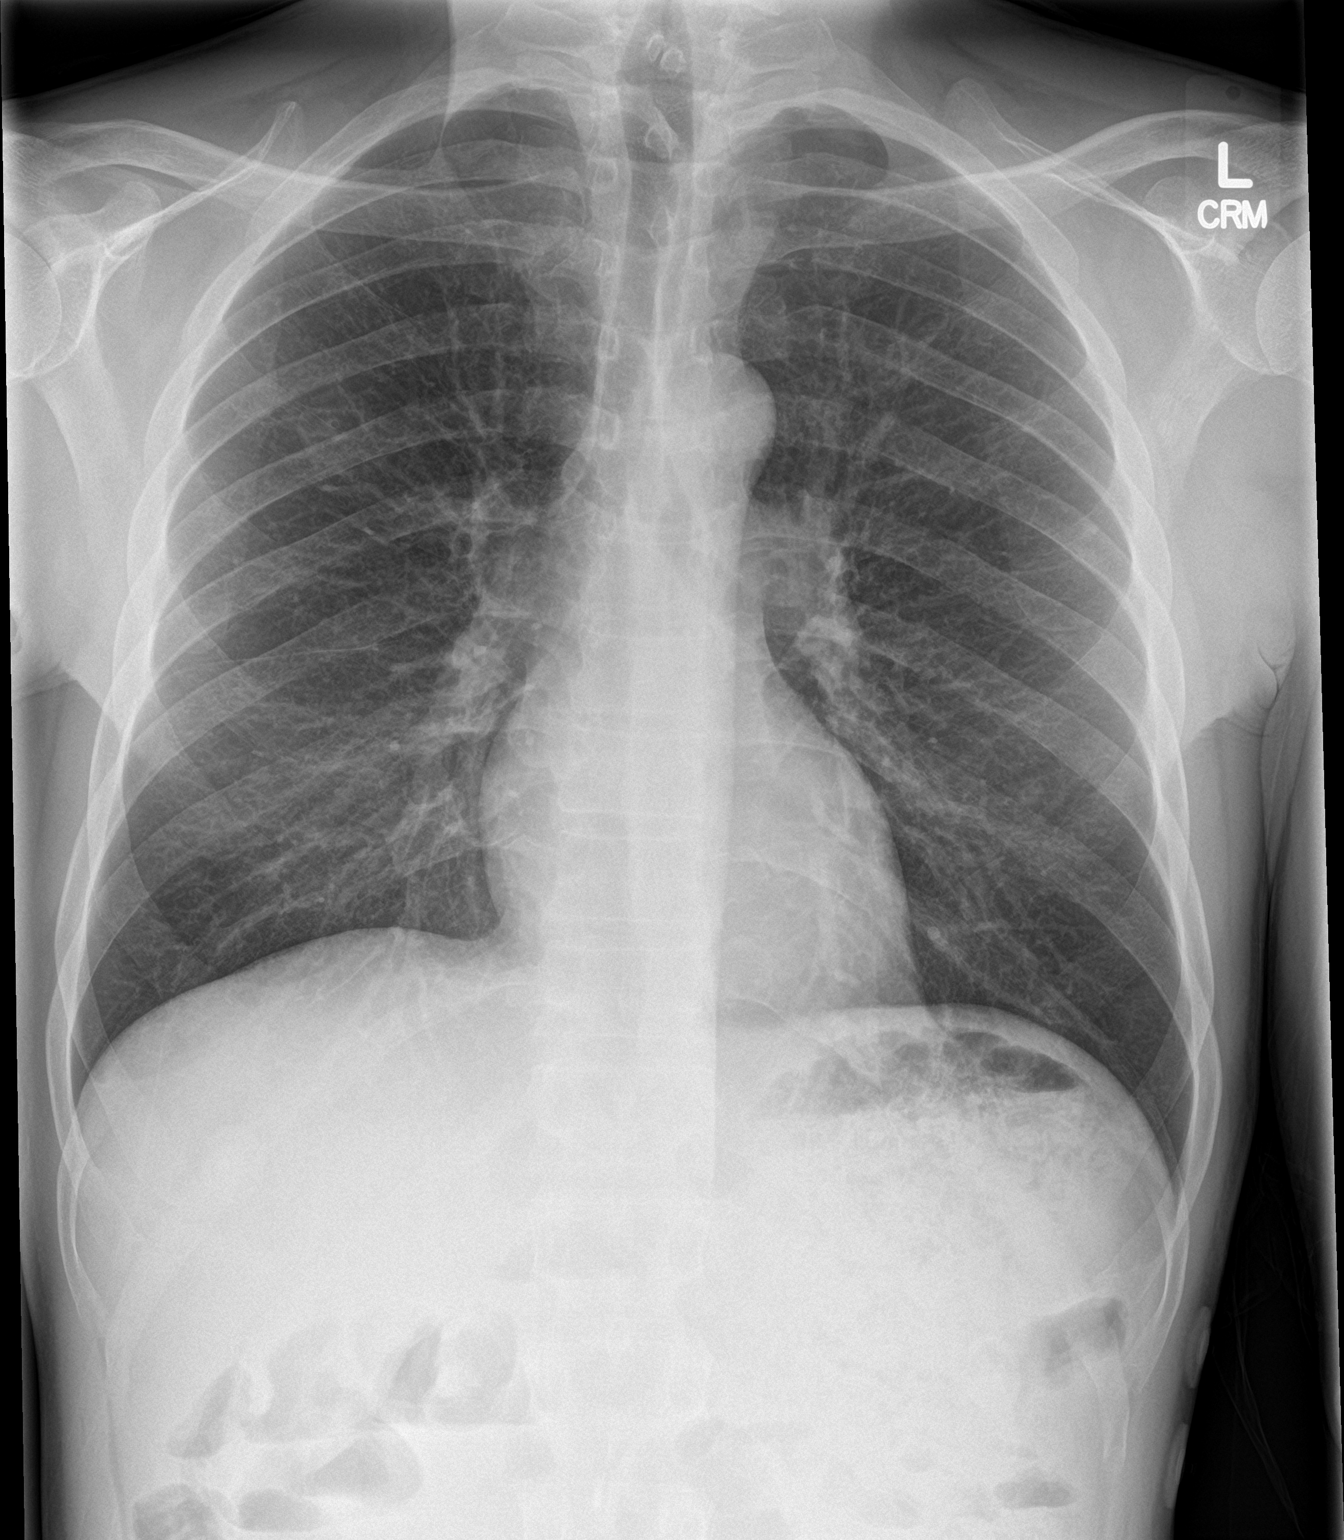

[chest lat]
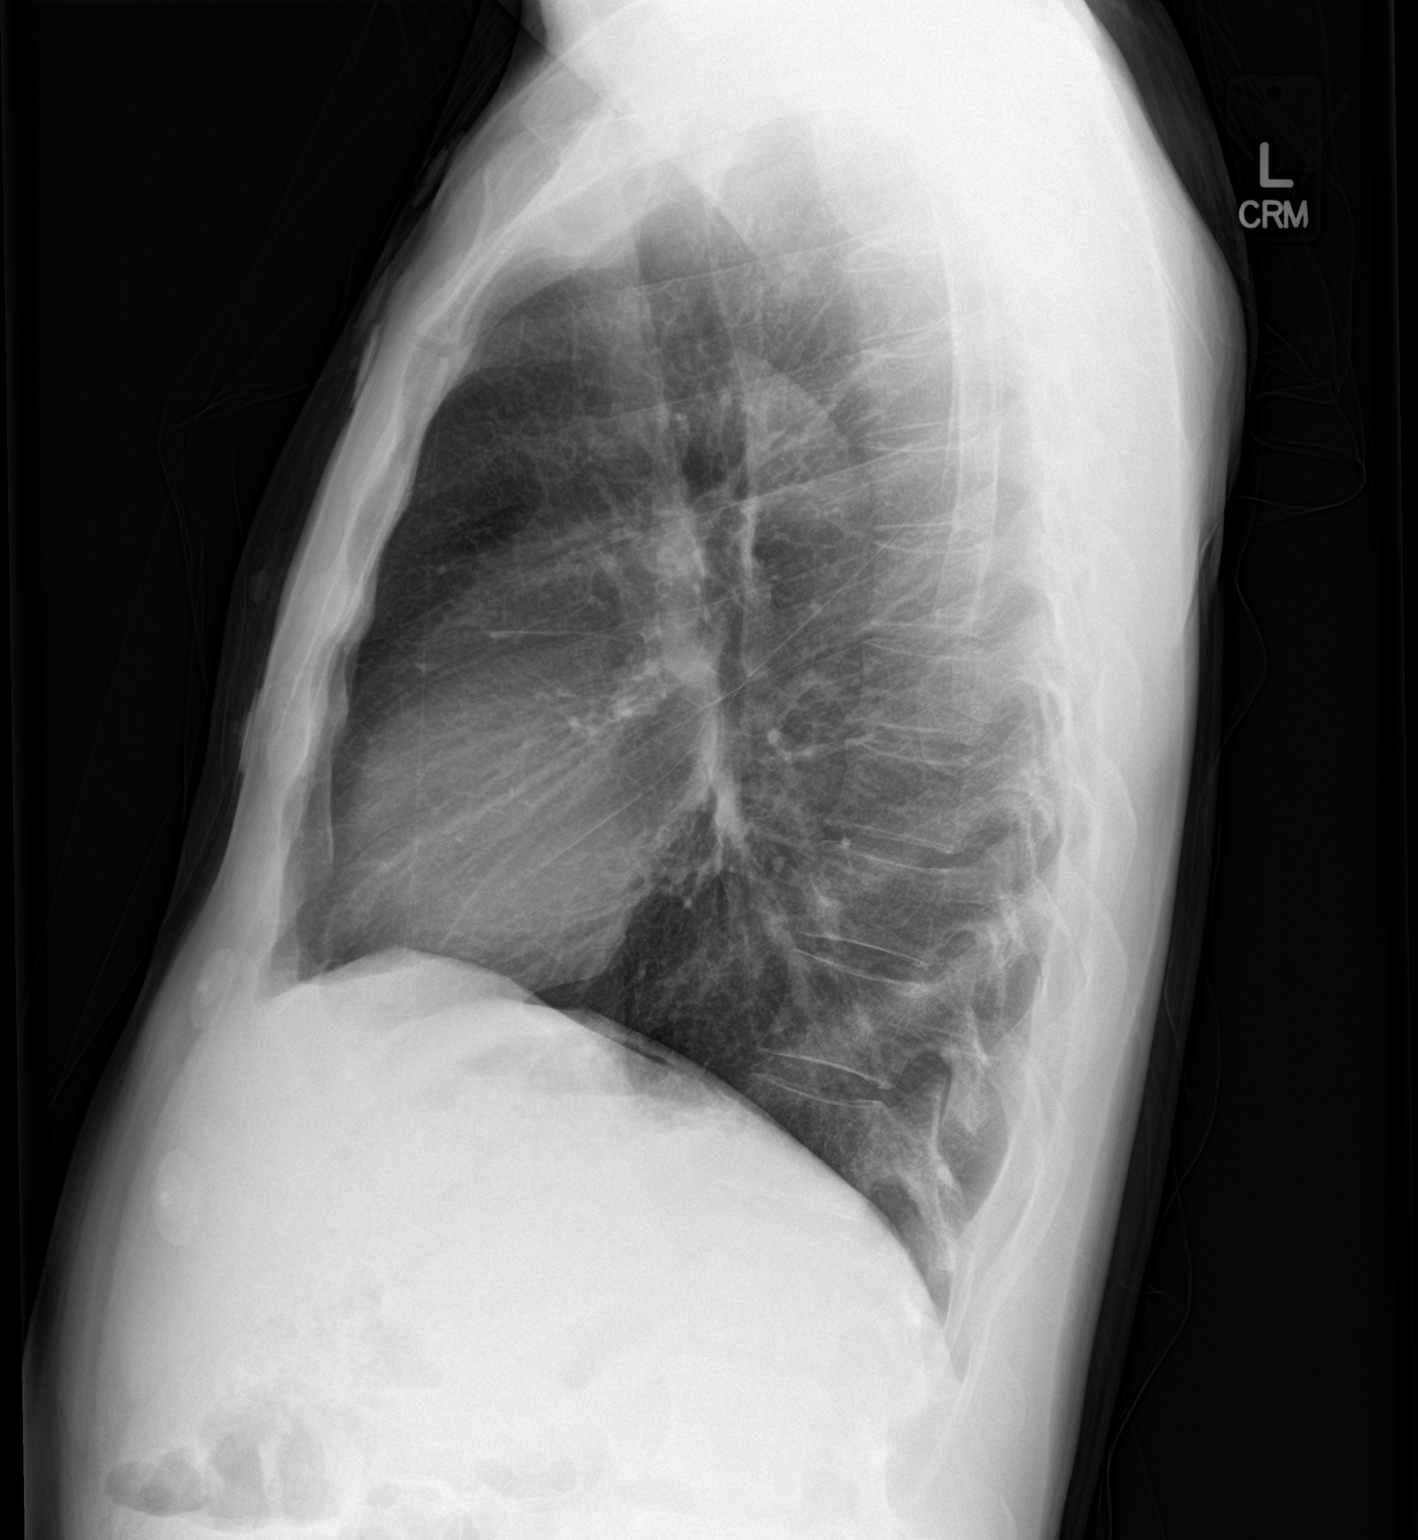

[2 of 2 positions shown; findings below may reference images not displayed]

FINDINGS: The heart size and mediastinal contours are within normal limits.
Both lungs are clear. The visualized skeletal structures are
unremarkable.
IMPRESSION: No active cardiopulmonary disease.

## 2019-12-30 ENCOUNTER — Other Ambulatory Visit (HOSPITAL_COMMUNITY): Payer: Self-pay

## 2020-01-01 ENCOUNTER — Other Ambulatory Visit (HOSPITAL_COMMUNITY)
Admission: RE | Admit: 2020-01-01 | Discharge: 2020-01-01 | Disposition: A | Discharge status: Home / Self Care | Payer: Medicaid Other | Source: Ambulatory Visit | Attending: Gastroenterology | Admitting: Gastroenterology

## 2020-01-01 DIAGNOSIS — Z20822 Contact with and (suspected) exposure to covid-19: Secondary | ICD-10-CM | POA: Diagnosis not present

## 2020-01-01 DIAGNOSIS — Z01812 Encounter for preprocedural laboratory examination: Secondary | ICD-10-CM | POA: Diagnosis present

## 2020-01-01 LAB — SARS CORONAVIRUS 2 (TAT 6-24 HRS): SARS Coronavirus 2: NEGATIVE

## 2020-01-02 ENCOUNTER — Ambulatory Visit (HOSPITAL_COMMUNITY)
Admission: RE | Admit: 2020-01-02 | Discharge: 2020-01-02 | Disposition: A | Discharge status: Home / Self Care | Payer: Medicaid Other | Attending: Gastroenterology | Admitting: Gastroenterology

## 2020-01-02 ENCOUNTER — Ambulatory Visit (HOSPITAL_COMMUNITY): Payer: Medicaid Other | Admitting: Certified Registered Nurse Anesthetist

## 2020-01-02 ENCOUNTER — Encounter (HOSPITAL_COMMUNITY): Payer: Self-pay | Admitting: Gastroenterology

## 2020-01-02 ENCOUNTER — Encounter (HOSPITAL_COMMUNITY)
Admission: RE | Disposition: A | Discharge status: Home / Self Care | Payer: Self-pay | Source: Home / Self Care | Attending: Gastroenterology

## 2020-01-02 ENCOUNTER — Other Ambulatory Visit: Payer: Self-pay

## 2020-01-02 DIAGNOSIS — R634 Abnormal weight loss: Secondary | ICD-10-CM

## 2020-01-02 DIAGNOSIS — Z809 Family history of malignant neoplasm, unspecified: Secondary | ICD-10-CM | POA: Insufficient documentation

## 2020-01-02 DIAGNOSIS — Z8379 Family history of other diseases of the digestive system: Secondary | ICD-10-CM | POA: Diagnosis not present

## 2020-01-02 DIAGNOSIS — I456 Pre-excitation syndrome: Secondary | ICD-10-CM | POA: Diagnosis not present

## 2020-01-02 DIAGNOSIS — I10 Essential (primary) hypertension: Secondary | ICD-10-CM | POA: Diagnosis not present

## 2020-01-02 DIAGNOSIS — J449 Chronic obstructive pulmonary disease, unspecified: Secondary | ICD-10-CM | POA: Diagnosis not present

## 2020-01-02 DIAGNOSIS — K219 Gastro-esophageal reflux disease without esophagitis: Secondary | ICD-10-CM | POA: Insufficient documentation

## 2020-01-02 DIAGNOSIS — Z681 Body mass index (BMI) 19 or less, adult: Secondary | ICD-10-CM | POA: Insufficient documentation

## 2020-01-02 DIAGNOSIS — K860 Alcohol-induced chronic pancreatitis: Secondary | ICD-10-CM | POA: Diagnosis not present

## 2020-01-02 DIAGNOSIS — Z811 Family history of alcohol abuse and dependence: Secondary | ICD-10-CM | POA: Diagnosis not present

## 2020-01-02 DIAGNOSIS — Z91013 Allergy to seafood: Secondary | ICD-10-CM | POA: Insufficient documentation

## 2020-01-02 DIAGNOSIS — K269 Duodenal ulcer, unspecified as acute or chronic, without hemorrhage or perforation: Secondary | ICD-10-CM | POA: Insufficient documentation

## 2020-01-02 DIAGNOSIS — Z888 Allergy status to other drugs, medicaments and biological substances status: Secondary | ICD-10-CM | POA: Diagnosis not present

## 2020-01-02 DIAGNOSIS — F4310 Post-traumatic stress disorder, unspecified: Secondary | ICD-10-CM | POA: Diagnosis not present

## 2020-01-02 DIAGNOSIS — M19022 Primary osteoarthritis, left elbow: Secondary | ICD-10-CM | POA: Insufficient documentation

## 2020-01-02 DIAGNOSIS — R569 Unspecified convulsions: Secondary | ICD-10-CM | POA: Insufficient documentation

## 2020-01-02 DIAGNOSIS — D573 Sickle-cell trait: Secondary | ICD-10-CM | POA: Diagnosis not present

## 2020-01-02 DIAGNOSIS — F419 Anxiety disorder, unspecified: Secondary | ICD-10-CM | POA: Diagnosis not present

## 2020-01-02 DIAGNOSIS — G43909 Migraine, unspecified, not intractable, without status migrainosus: Secondary | ICD-10-CM | POA: Diagnosis not present

## 2020-01-02 DIAGNOSIS — F17290 Nicotine dependence, other tobacco product, uncomplicated: Secondary | ICD-10-CM | POA: Insufficient documentation

## 2020-01-02 DIAGNOSIS — Z91040 Latex allergy status: Secondary | ICD-10-CM | POA: Insufficient documentation

## 2020-01-02 DIAGNOSIS — F319 Bipolar disorder, unspecified: Secondary | ICD-10-CM | POA: Diagnosis not present

## 2020-01-02 DIAGNOSIS — K862 Cyst of pancreas: Secondary | ICD-10-CM | POA: Insufficient documentation

## 2020-01-02 DIAGNOSIS — Z8249 Family history of ischemic heart disease and other diseases of the circulatory system: Secondary | ICD-10-CM | POA: Insufficient documentation

## 2020-01-02 DIAGNOSIS — M170 Bilateral primary osteoarthritis of knee: Secondary | ICD-10-CM | POA: Insufficient documentation

## 2020-01-02 DIAGNOSIS — F17210 Nicotine dependence, cigarettes, uncomplicated: Secondary | ICD-10-CM | POA: Insufficient documentation

## 2020-01-02 DIAGNOSIS — K449 Diaphragmatic hernia without obstruction or gangrene: Secondary | ICD-10-CM | POA: Diagnosis not present

## 2020-01-02 DIAGNOSIS — Z91041 Radiographic dye allergy status: Secondary | ICD-10-CM | POA: Insufficient documentation

## 2020-01-02 DIAGNOSIS — Z91048 Other nonmedicinal substance allergy status: Secondary | ICD-10-CM | POA: Diagnosis not present

## 2020-01-02 DIAGNOSIS — Z791 Long term (current) use of non-steroidal anti-inflammatories (NSAID): Secondary | ICD-10-CM | POA: Insufficient documentation

## 2020-01-02 DIAGNOSIS — M19021 Primary osteoarthritis, right elbow: Secondary | ICD-10-CM | POA: Insufficient documentation

## 2020-01-02 DIAGNOSIS — M1990 Unspecified osteoarthritis, unspecified site: Secondary | ICD-10-CM | POA: Insufficient documentation

## 2020-01-02 DIAGNOSIS — E7800 Pure hypercholesterolemia, unspecified: Secondary | ICD-10-CM | POA: Insufficient documentation

## 2020-01-02 DIAGNOSIS — Z79899 Other long term (current) drug therapy: Secondary | ICD-10-CM | POA: Insufficient documentation

## 2020-01-02 HISTORY — PX: ESOPHAGOGASTRODUODENOSCOPY (EGD) WITH PROPOFOL: SHX5813

## 2020-01-02 HISTORY — PX: EUS: SHX5427

## 2020-01-02 HISTORY — PX: CYST ENTEROSTOMY: SHX6861

## 2020-01-02 SURGERY — UPPER ENDOSCOPIC ULTRASOUND (EUS) RADIAL
Anesthesia: Monitor Anesthesia Care

## 2020-01-02 MED ORDER — EPHEDRINE SULFATE-NACL 50-0.9 MG/10ML-% IV SOSY
PREFILLED_SYRINGE | INTRAVENOUS | Status: DC | PRN
  Administered 2020-01-02: 10 mg via INTRAVENOUS

## 2020-01-02 MED ORDER — SODIUM CHLORIDE 0.9 % IV SOLN: INTRAVENOUS | Status: DC

## 2020-01-02 MED ORDER — CIPROFLOXACIN IN D5W 400 MG/200ML IV SOLN
INTRAVENOUS | Status: AC
  Filled 2020-01-02: qty 200

## 2020-01-02 MED ORDER — CIPROFLOXACIN IN D5W 400 MG/200ML IV SOLN
INTRAVENOUS | Status: DC | PRN
  Administered 2020-01-02: 400 mg via INTRAVENOUS

## 2020-01-02 MED ORDER — PROPOFOL 500 MG/50ML IV EMUL
INTRAVENOUS | Status: DC | PRN
  Administered 2020-01-02: 60 mg via INTRAVENOUS
  Administered 2020-01-02: 125 ug/kg/min via INTRAVENOUS

## 2020-01-02 MED ORDER — LIDOCAINE VISCOUS HCL 2 % MT SOLN
OROMUCOSAL | 1 refills | Status: DC
Start: 2020-01-02 — End: 2020-03-05

## 2020-01-02 MED ORDER — LACTATED RINGERS IV SOLN: INTRAVENOUS | Status: DC

## 2020-01-02 MED ORDER — CIPROFLOXACIN HCL 500 MG PO TABS: 500.0000 mg | ORAL_TABLET | Freq: Two times a day (BID) | ORAL | 0 refills | Status: DC

## 2020-01-02 MED ORDER — PHENYLEPHRINE 40 MCG/ML (10ML) SYRINGE FOR IV PUSH (FOR BLOOD PRESSURE SUPPORT)
PREFILLED_SYRINGE | INTRAVENOUS | Status: DC | PRN
  Administered 2020-01-02 (×3): 80 ug via INTRAVENOUS

## 2020-01-02 NOTE — Op Note (Signed)
[facility] Patient Name: [name] Procedure Date: 01/02/2020 MRN: [id number] Attending MD: Milus Banister , MD Date of Birth: 18-Nov-1970 CSN: [id number] Age: 49 Admit Type: Outpatient Procedure:                Upper EUS Indications:              chronic etoh pancreatitis, recent weight loss,                            cystic lesions in pancreas, DU noted 1 month ago                            during EGD, elevated gastric and chromgranin levels                            (I believe while on PPI) Providers:                Milus Banister, MD, Virgina Evener RN, RN,                            Faustina Mbumina, Technician, Cletis Athens,                            Technician Referring MD:             Jolly Mango, MD Medicines:                Monitored Anesthesia Care, Cipro 106 mg IV Complications:            No immediate complications. Estimated blood loss:                            None. Estimated Blood Loss:     Estimated blood loss: none. Procedure:                Pre-Anesthesia Assessment:                           - Prior to the procedure, a History and Physical                            was performed, and patient medications and                            allergies were reviewed. The patient's tolerance of                            previous anesthesia was also reviewed. The risks                            and benefits of the procedure and the sedation                            options and risks were discussed with the patient.  All questions were answered, and informed consent                            was obtained. Prior Anticoagulants: The patient has                            taken no previous anticoagulant or antiplatelet                            agents. ASA Grade Assessment: III - A patient with                            severe systemic disease. After reviewing the risks                            and benefits,  the patient was deemed in                            satisfactory condition to undergo the procedure.                           After obtaining informed consent, the endoscope was                            passed under direct vision. Throughout the                            procedure, the patient's blood pressure, pulse, and                            oxygen saturations were monitored continuously. The                            GIF-H190 ([id number]) Olympus gastroscope was                            introduced through the mouth, and advanced to the                            second part of duodenum. The upper EUS was                            accomplished without difficulty. The patient                            tolerated the procedure well. Scope In: Scope Out: Findings:      ENDOSCOPIC FINDING: :      The examined esophagus was endoscopically normal.      The entire examined stomach was endoscopically normal.      The recently noted duodenal bulb ulcer is much improved, nearly       completely healed. There remains some edematous mucosa at the site as       well as burgeoning fibrosis, scar.      ENDOSONOGRAPHIC FINDING: :  1. Medium sized, thick walled (2-60mm) anechoic cyst with floating debris       in the body of the pancreas. This measures 4.8cm across. The presense of       the cyst precluded adequate pancreatic parenchymal evaluation. I nearly       completely aspirated the cyst (removing about 120cc of brownish, old       blood tinged fluid with flecks of debris) using a single transgastric       pass with a 19guage EUS FNA needle. The aspirated fluid was sent for       cytology, CEA and amylase. I estimate that 30cc of fluid remains.       Following fluid aspiration I was able to evaluate the pancreas more       completely. There were hyperechoic strand, foci and a honeycomb       appearance to the parenchyma. There was not an obvious discrete solid       mass however  the rind of the just aspirated cyst was thicker following       removal of the fluid within.      2. Main pancreatic duct was non-dilated appearing.      3. Gallbladder was normal, without evident stones.      4. No peripancreatic adenopathy.      5. Limited views of liver, spleen, portal and splenic vessels were all       normal. Impression:               - Medium sized (4.8cm) thick walled cystic lesion                            in the body of the pancreas. This was nearly                            completley aspirated, yielding 120cc of brownish,                            old blood tinged fluid with solid flecks of tissue                            which was sent for cytology, CEA and amylase.                           - Following cyst aspiration the pancreatic                            parenchyma showed pretty clear signs of chronic                            pancreatitis but no obvious mass lesions (except to                            the residual 'rind' from the cyst aspiration                            above). The changes of chronic pancreatitis  signficantly limit the sensitivity of EUS (and                            other imaging modalities) to detect neolasm.                           - The previously noted duodenal bulb ulcer is much                            improved in the interim. Moderate Sedation:      Not Applicable - Patient had care per Anesthesia. Recommendation:           - Discharge patient to home. He will complete 3                            days of twice daily cipro.                           - Await cyst fluid testing. Procedure Code(s):        --- Professional ---                           [phone], Esophagogastroduodenoscopy, flexible,                            transoral; with transendoscopic ultrasound-guided                            intramural or transmural fine needle                            aspiration/biopsy(s),  (includes endoscopic                            ultrasound examination limited to the esophagus,                            stomach or duodenum, and adjacent structures) Diagnosis Code(s):        --- Professional ---                           I20.3, Cyst of pancreas                           R93.3, Abnormal findings on diagnostic imaging of                            other parts of digestive tract CPT copyright 2019 American Medical Association. All rights reserved. The codes documented in this report are preliminary and upon coder review may  be revised to meet current compliance requirements. Milus Banister, MD 01/02/2020 10:40:29 AM This report has been signed electronically. Number of Addenda: 0

## 2020-01-02 NOTE — Anesthesia Postprocedure Evaluation (Signed)
Anesthesia Post Note  Patient: [name]  Procedure(s) Performed: UPPER ENDOSCOPIC ULTRASOUND (EUS) RADIAL (N/A )     Patient location during evaluation: Endoscopy Anesthesia Type: MAC Level of consciousness: awake and alert, patient cooperative and oriented Pain management: pain level controlled Respiratory status: spontaneous breathing, nonlabored ventilation and respiratory function stable Cardiovascular status: blood pressure returned to baseline and stable Postop Assessment: no apparent nausea or vomiting and able to ambulate Anesthetic complications: no   No complications documented.  Last Vitals:  Vitals:   01/02/20 1050 01/02/20 1056  BP: 112/82   Pulse: 72 69  Resp: 13 16  Temp:    SpO2: 100% 100%    Last Pain:  Vitals:   01/02/20 1031  TempSrc:   PainSc: 7                  Symone Cornman,E. Erick Murin

## 2020-01-02 NOTE — Transfer of Care (Signed)
Immediate Anesthesia Transfer of Care Note  Patient: [name]  Procedure(s) Performed: Procedure(s): UPPER ENDOSCOPIC ULTRASOUND (EUS) RADIAL (N/A)  Patient Location: PACU and Endoscopy Unit  Anesthesia Type:mac  Level of Consciousness: awake, alert  and oriented  Airway & Oxygen Therapy: Patient Spontanous Breathing and Patient connected to nasal cannula oxygen  Post-op Assessment: Report given to RN and Post -op Vital signs reviewed and stable  Post vital signs: Reviewed and stable  Last Vitals:  Vitals:   01/02/20 0815  BP: (!) 124/94  Resp: 10  Temp: (!) 36 C  SpO2: 240%    Complications: No apparent anesthesia complications

## 2020-01-02 NOTE — Anesthesia Preprocedure Evaluation (Addendum)
Anesthesia Evaluation  Patient identified by MRN, date of birth, ID band Patient awake    Reviewed: Allergy & Precautions, NPO status , Patient's Chart, lab work & pertinent test results  History of Anesthesia Complications Negative for: history of anesthetic complications  Airway Mallampati: II  TM Distance: >3 FB Neck ROM: Full    Dental  (+) Poor Dentition, Dental Advisory Given, Missing, Chipped   Pulmonary COPD,  COPD inhaler, Current SmokerPatient did not abstain from smoking.,  01/01/2020 SARS coronavirus NEG   breath sounds clear to auscultation       Cardiovascular hypertension, Pt. on medications and Pt. on home beta blockers (-) angina+ dysrhythmias (WPW)  Rhythm:Regular Rate:Normal  08/2019 ECHO: EF 60-65%, valves normal  '14 cath: normal coronaries   Neuro/Psych  Headaches, Seizures -,  PSYCHIATRIC DISORDERS (PTSD) Anxiety Depression Bipolar Disorder    GI/Hepatic hiatal hernia, GERD  Medicated and Controlled,(+)     substance abuse (last ETOH yesterday, MJ tuesday, cocaine 3 months ago)  alcohol use, cocaine use and marijuana use,   Endo/Other  negative endocrine ROS  Renal/GU negative Renal ROS     Musculoskeletal   Abdominal   Peds  Hematology  (+) Sickle cell trait ,   Anesthesia Other Findings   Reproductive/Obstetrics                            Anesthesia Physical Anesthesia Plan  ASA: III  Anesthesia Plan: MAC   Post-op Pain Management:    Induction:   PONV Risk Score and Plan: 0  Airway Management Planned: Natural Airway and Nasal Cannula  Additional Equipment: None  Intra-op Plan:   Post-operative Plan:   Informed Consent: I have reviewed the patients History and Physical, chart, labs and discussed the procedure including the risks, benefits and alternatives for the proposed anesthesia with the patient or authorized representative who has indicated  his/her understanding and acceptance.     Dental advisory given  Plan Discussed with: CRNA and Surgeon  Anesthesia Plan Comments:        Anesthesia Quick Evaluation

## 2020-01-02 NOTE — H&P (Signed)
HPI: This is a chyronic etoh pancreatitis, losing weight, recent large DU (h pylori neg). Very elevated chromogranin level (on PPI)  ROS: complete GI ROS as described in HPI, all other review negative.  Constitutional:  No unintentional weight loss   Past Medical History:  Diagnosis Date  . Alcoholism /alcohol abuse ([place])   . Anemia   . Anxiety   . Arthritis    "knees; arms; elbows" (03/26/2015)  . Asthma   . Bipolar disorder ([place])   . Chronic bronchitis ([place])   . Chronic lower back pain   . Chronic pancreatitis ([place])   . Cocaine abuse ([place])   . Depression   . Family history of adverse reaction to anesthesia    "grandmother gets confused"  . Femoral condyle fracture ([place]) 03/08/2014   left medial/notes 03/09/2014  . GERD (gastroesophageal reflux disease)   . H/O hiatal hernia   . H/O suicide attempt 10/2012  . High cholesterol   . History of blood transfusion 10/2012   "when I tried to commit suicide"  . History of stomach ulcers   . Hypertension   . Marijuana abuse, continuous   . Migraine    "a few times/year" (03/26/2015)  . Pneumonia 1990's X 3  . PTSD (post-traumatic stress disorder)   . Sickle cell trait ([place])   . WPW (Wolff-Parkinson-White syndrome)    [name] 03/06/2013    Past Surgical History:  Procedure Laterality Date  . BIOPSY  11/25/2017   Procedure: BIOPSY;  Surgeon: Arta Silence, MD;  Location: [facility] ENDOSCOPY;  Service: Endoscopy;;  . BIOPSY  10/14/2018   Procedure: BIOPSY;  Surgeon: Arta Silence, MD;  Location: [facility];  Service: Endoscopy;;  . CARDIAC CATHETERIZATION    . ESOPHAGOGASTRODUODENOSCOPY (EGD) WITH PROPOFOL N/A 11/25/2017   Procedure: ESOPHAGOGASTRODUODENOSCOPY (EGD) WITH PROPOFOL;  Surgeon: Arta Silence, MD;  Location: [facility];  Service: Endoscopy;  Laterality: N/A;  . ESOPHAGOGASTRODUODENOSCOPY (EGD) WITH PROPOFOL Left 10/14/2018   Procedure: ESOPHAGOGASTRODUODENOSCOPY (EGD) WITH PROPOFOL;  Surgeon: Arta Silence, MD;   Location: [facility] ENDOSCOPY;  Service: Endoscopy;  Laterality: Left;  . ESOPHAGOGASTRODUODENOSCOPY (EGD) WITH PROPOFOL N/A 11/14/2018   Procedure: ESOPHAGOGASTRODUODENOSCOPY (EGD) WITH PROPOFOL;  Surgeon: Laurence Spates, MD;  Location: [facility] ENDOSCOPY;  Service: Gastroenterology;  Laterality: N/A;  . EYE SURGERY Left 1990's   "result of trauma"   . FACIAL FRACTURE SURGERY Left 1990's   "result of trauma"   . FRACTURE SURGERY    . HERNIA REPAIR    . LEFT HEART CATHETERIZATION WITH CORONARY ANGIOGRAM Right 03/07/2013   Procedure: LEFT HEART CATHETERIZATION WITH CORONARY ANGIOGRAM;  Surgeon: [name], MD;  Location: [facility] CATH LAB;  Service: Cardiovascular;  Laterality: Right;  . UMBILICAL HERNIA REPAIR    . UPPER GASTROINTESTINAL ENDOSCOPY      Current Facility-Administered Medications  Medication Dose Route Frequency Provider Last Rate Last Admin  . 0.9 %  sodium chloride infusion   Intravenous Continuous Milus Banister, MD      . lactated ringers infusion   Intravenous Continuous Milus Banister, MD 10 mL/hr at 01/02/20 0844 New Bag at 01/02/20 0844    Allergies as of 11/27/2019 - Review Complete 11/20/2019  Allergen Reaction Noted  . Robaxin [methocarbamol] Other (See Comments) 07/20/2016  . Shellfish-derived products Nausea And Vomiting 08/18/2010  . Trazodone Other (See Comments) 09/28/2011  . Trazodone and nefazodone Other (See Comments) 09/28/2011  . Adhesive [tape] Itching 03/17/2018  . Latex Itching 03/17/2018  . Toradol [ketorolac tromethamine] Other (See Comments) 03/17/2018  .  Contrast media [iodinated diagnostic agents] Hives 12/04/2016  . Reglan [metoclopramide] Other (See Comments) 11/18/2015    Family History  Problem Relation Age of Onset  . Hypertension Mother   . Cirrhosis Mother   . Alcoholism Mother   . Hypertension Father   . Melanoma Father   . Hypertension Other   . Coronary artery disease Other     Social History   Socioeconomic History  . Marital  status: Single    Spouse name: Not on file  . Number of children: Not on file  . Years of education: Not on file  . Highest education level: Not on file  Occupational History  . Occupation: disabled  Tobacco Use  . Smoking status: Current Every Day Smoker    Packs/day: 1.00    Years: 33.00    Pack years: 33.00    Types: Cigarettes, E-cigarettes  . Smokeless tobacco: Never Used  Vaping Use  . Vaping Use: Former  Substance and Sexual Activity  . Alcohol use: Yes  . Drug use: Yes    Types: Marijuana, Cocaine    Comment: daily marijuana use; last cocaine use about 3 months ago  . Sexual activity: Yes    Birth control/protection: None  Other Topics Concern  . Not on file  Social History Narrative   ** Merged History Encounter **       Social Determinants of Health   Financial Resource Strain:   . Difficulty of Paying Living Expenses: Not on file  Food Insecurity:   . Worried About Charity fundraiser in the Last Year: Not on file  . Ran Out of Food in the Last Year: Not on file  Transportation Needs:   . Lack of Transportation (Medical): Not on file  . Lack of Transportation (Non-Medical): Not on file  Physical Activity:   . Days of Exercise per Week: Not on file  . Minutes of Exercise per Session: Not on file  Stress:   . Feeling of Stress : Not on file  Social Connections:   . Frequency of Communication with Friends and Family: Not on file  . Frequency of Social Gatherings with Friends and Family: Not on file  . Attends Religious Services: Not on file  . Active Member of Clubs or Organizations: Not on file  . Attends Archivist Meetings: Not on file  . Marital Status: Not on file  Intimate Partner Violence:   . Fear of Current or Ex-Partner: Not on file  . Emotionally Abused: Not on file  . Physically Abused: Not on file  . Sexually Abused: Not on file     Physical Exam: BP (!) 124/94   Temp (!) 96.8 F (36 C) (Temporal)   Resp 10   Ht 5\' 9"   (1.753 m)   Wt 53.1 kg   SpO2 100%   BMI 17.28 kg/m  Constitutional: generally well-appearing Psychiatric: alert and oriented x3 Abdomen: soft, nontender, nondistended, no obvious ascites, no peritoneal signs, normal bowel sounds No peripheral edema noted in lower extremities  Assessment and plan: 49 y.o. male with chronic etoh pancreatitis, recent DU, losing weight, elevated chromogranin levels  For EUS today  Please see the "Patient Instructions" section for addition details about the plan.  Owens Loffler, MD [facility] 01/02/2020, 8:51 AM

## 2020-01-02 NOTE — Discharge Instructions (Signed)
YOU HAD AN ENDOSCOPIC PROCEDURE TODAY: Refer to the procedure report and other information in the discharge instructions given to you for any specific questions about what was found during the examination. If this information does not answer your questions, please call [place] office at [phone] to clarify.  ° °YOU SHOULD EXPECT: Some feelings of bloating in the abdomen. Passage of more gas than usual. Walking can help get rid of the air that was put into your GI tract during the procedure and reduce the bloating. If you had a lower endoscopy (such as a colonoscopy or flexible sigmoidoscopy) you may notice spotting of blood in your stool or on the toilet paper. Some abdominal soreness may be present for a day or two, also. ° °DIET: Your first meal following the procedure should be a light meal and then it is ok to progress to your normal diet. A half-sandwich or bowl of soup is an example of a good first meal. Heavy or fried foods are harder to digest and may make you feel nauseous or bloated. Drink plenty of fluids but you should avoid alcoholic beverages for 24 hours. If you had a esophageal dilation, please see attached instructions for diet.   ° °ACTIVITY: Your care partner should take you home directly after the procedure. You should plan to take it easy, moving slowly for the rest of the day. You can resume normal activity the day after the procedure however YOU SHOULD NOT DRIVE, use power tools, machinery or perform tasks that involve climbing or major physical exertion for 24 hours (because of the sedation medicines used during the test).  ° °SYMPTOMS TO REPORT IMMEDIATELY: °A gastroenterologist can be reached at any hour. Please call [phone]  for any of the following symptoms:  °Following lower endoscopy (colonoscopy, flexible sigmoidoscopy) °Excessive amounts of blood in the stool  °Significant tenderness, worsening of abdominal pains  °Swelling of the abdomen that is new, acute  °Fever of 100° or  higher  °Following upper endoscopy (EGD, EUS, ERCP, esophageal dilation) °Vomiting of blood or coffee ground material  °New, significant abdominal pain  °New, significant chest pain or pain under the shoulder blades  °Painful or persistently difficult swallowing  °New shortness of breath  °Black, tarry-looking or red, bloody stools ° °FOLLOW UP:  °If any biopsies were taken you will be contacted by phone or by letter within the next 1-3 weeks. Call [phone]  if you have not heard about the biopsies in 3 weeks.  °Please also call with any specific questions about appointments or follow up tests. ° °

## 2020-01-03 LAB — CYTOLOGY - NON PAP

## 2020-01-10 ENCOUNTER — Telehealth: Payer: Self-pay | Admitting: Gastroenterology

## 2020-01-10 NOTE — Telephone Encounter (Signed)
Called mobile number twice, phone rang, and then fast busy tone.  Lm on home vm for patient to return call

## 2020-01-10 NOTE — Telephone Encounter (Signed)
Reviewed final cyst fluid testing results  CEA 343 ng/mL (ULN 192) Amylase 9347 units/L  Cytology negative for neoplastic cells   Richardson Landry, As we discussed, his cyst fluid testing is not completely normal.  CEA level was slightly elevated.  His amylase level is also very elevated.  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  Since the CEA is slightly elevated it may be a mucinous cyst which can have precancerous properties.  Fortunately no evidence of cancer now in the samples.  I think realistically we can offer is intensive surveillance and I would recommend repeat imaging with MRI in about 3 months.  Thanks

## 2020-01-10 NOTE — Telephone Encounter (Signed)
[place] can you help relay the following to the patient: -Patient does have a abnormal pancreas with chronic inflammation and a large cyst.  Based on Dr. 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  This is going to need to be monitored quite closely over time, recommend another MRI in 3 months.  I am not sure if he has been able to see pain management yet about medical management for this or if his primary care is providing that for him.  We do not prescribe narcotics in this clinic for long-term management although he may need that from pain management.  I can see him back in the office in 3 months for reassessment

## 2020-01-10 NOTE — Telephone Encounter (Signed)
Spoke with patient regarding results, pt is aware that we will call him in about 3 months to get him scheduled for a repeat MRI and an office follow up. Recall in epic for an office follow up, reminder in epic for MRI IN 3 months.

## 2020-02-15 ENCOUNTER — Other Ambulatory Visit: Payer: Self-pay

## 2020-02-15 ENCOUNTER — Emergency Department (HOSPITAL_COMMUNITY)
Admission: EM | Admit: 2020-02-15 | Discharge: 2020-02-16 | Disposition: A | Discharge status: Home / Self Care | Payer: Medicaid Other | Attending: Emergency Medicine | Admitting: Emergency Medicine

## 2020-02-15 ENCOUNTER — Emergency Department (HOSPITAL_COMMUNITY): Payer: Medicaid Other

## 2020-02-15 DIAGNOSIS — J45909 Unspecified asthma, uncomplicated: Secondary | ICD-10-CM | POA: Insufficient documentation

## 2020-02-15 DIAGNOSIS — R0789 Other chest pain: Secondary | ICD-10-CM | POA: Insufficient documentation

## 2020-02-15 DIAGNOSIS — K219 Gastro-esophageal reflux disease without esophagitis: Secondary | ICD-10-CM | POA: Insufficient documentation

## 2020-02-15 DIAGNOSIS — Z79899 Other long term (current) drug therapy: Secondary | ICD-10-CM | POA: Insufficient documentation

## 2020-02-15 DIAGNOSIS — R1084 Generalized abdominal pain: Secondary | ICD-10-CM | POA: Insufficient documentation

## 2020-02-15 DIAGNOSIS — F17290 Nicotine dependence, other tobacco product, uncomplicated: Secondary | ICD-10-CM | POA: Diagnosis not present

## 2020-02-15 DIAGNOSIS — I10 Essential (primary) hypertension: Secondary | ICD-10-CM | POA: Diagnosis not present

## 2020-02-15 DIAGNOSIS — Z20822 Contact with and (suspected) exposure to covid-19: Secondary | ICD-10-CM | POA: Diagnosis not present

## 2020-02-15 DIAGNOSIS — Z91040 Latex allergy status: Secondary | ICD-10-CM | POA: Insufficient documentation

## 2020-02-15 LAB — CBC
HCT: 35.5 % — ABNORMAL LOW (ref 39.0–52.0)
Hemoglobin: 11.6 g/dL — ABNORMAL LOW (ref 13.0–17.0)
MCH: 32.7 pg (ref 26.0–34.0)
MCHC: 32.7 g/dL (ref 30.0–36.0)
MCV: 100 fL (ref 80.0–100.0)
Platelets: 202 10*3/uL (ref 150–400)
RBC: 3.55 MIL/uL — ABNORMAL LOW (ref 4.22–5.81)
RDW: 16.6 % — ABNORMAL HIGH (ref 11.5–15.5)
WBC: 7.7 10*3/uL (ref 4.0–10.5)
nRBC: 0 % (ref 0.0–0.2)

## 2020-02-15 LAB — TROPONIN I (HIGH SENSITIVITY)
Troponin I (High Sensitivity): 4 ng/L (ref <18)
Troponin I (High Sensitivity): 7 ng/L (ref <18)

## 2020-02-15 LAB — BASIC METABOLIC PANEL
Anion gap: 11 (ref 5–15)
BUN: 7 mg/dL (ref 6–20)
CO2: 20 mmol/L — ABNORMAL LOW (ref 22–32)
Calcium: 8.7 mg/dL — ABNORMAL LOW (ref 8.9–10.3)
Chloride: 109 mmol/L (ref 98–111)
Creatinine, Ser: 0.95 mg/dL (ref 0.61–1.24)
GFR, Estimated: >60 mL/min (ref >60)
Glucose, Bld: 106 mg/dL — ABNORMAL HIGH (ref 70–99)
Potassium: 3.9 mmol/L (ref 3.5–5.1)
Sodium: 140 mmol/L (ref 135–145)

## 2020-02-15 NOTE — ED Triage Notes (Signed)
Pt with central chest pain with radiation to jaw onset 45 mins ago with associated shob. Two nitro given PTA, cannot take ASA.

## 2020-02-16 LAB — HEPATIC FUNCTION PANEL
ALT: 53 U/L — ABNORMAL HIGH (ref 0–44)
AST: 76 U/L — ABNORMAL HIGH (ref 15–41)
Albumin: 3.2 g/dL — ABNORMAL LOW (ref 3.5–5.0)
Alkaline Phosphatase: 69 U/L (ref 38–126)
Bilirubin, Direct: <0.1 mg/dL (ref 0.0–0.2)
Total Bilirubin: 0.5 mg/dL (ref 0.3–1.2)
Total Protein: 6.5 g/dL (ref 6.5–8.1)

## 2020-02-16 LAB — HEMOGLOBIN AND HEMATOCRIT, BLOOD
HCT: 35 % — ABNORMAL LOW (ref 39.0–52.0)
Hemoglobin: 11.4 g/dL — ABNORMAL LOW (ref 13.0–17.0)

## 2020-02-16 LAB — RESPIRATORY PANEL BY RT PCR (FLU A&B, COVID)
Influenza A by PCR: NEGATIVE
Influenza B by PCR: NEGATIVE
SARS Coronavirus 2 by RT PCR: NEGATIVE

## 2020-02-16 LAB — LIPASE, BLOOD: Lipase: 28 U/L (ref 11–51)

## 2020-02-16 MED ORDER — LIDOCAINE VISCOUS HCL 2 % MT SOLN
15.0000 mL | Freq: Once | OROMUCOSAL | Status: AC
  Administered 2020-02-16: 15 mL via ORAL
  Filled 2020-02-16: qty 15

## 2020-02-16 MED ORDER — ALUM & MAG HYDROXIDE-SIMETH 200-200-20 MG/5ML PO SUSP
30.0000 mL | Freq: Once | ORAL | Status: AC
  Administered 2020-02-16: 30 mL via ORAL
  Filled 2020-02-16: qty 30

## 2020-02-16 NOTE — Discharge Instructions (Addendum)
Avoid alcohol. Follow-up with Dr. Havery Moros. Quarantine pending your COVID test results.

## 2020-02-16 NOTE — ED Notes (Signed)
Provided pt w/turkey sandwich and gingerale

## 2020-02-16 NOTE — ED Notes (Signed)
Pt now reports, "I can't smell nothing, or taste nothing that started last night".

## 2020-02-16 NOTE — ED Notes (Signed)
Pt keeps taking bp cuff off, that is why vs are not as frequent for acuity.

## 2020-02-16 NOTE — ED Provider Notes (Signed)
[facility] EMERGENCY DEPARTMENT Provider Note   CSN: [id number] Arrival date & time: 02/15/20  1903     History Chief Complaint  Patient presents with  . Chest Pain    [name] is a 49 y.o. male.  49yo male with history of chronic pancreatitis, GERD, hypertension, hyperlipidemia, additional history listed below, presents with multiple complaints. Patient reports abdominal pain x 4 days, similar to his chronic pain, associated with loose stools. Reports compliance with his famotidine, carafate, creon. Reports bright red blood in his stools, states his stools looks like "a greasy gummy bear life savor." Stools are more formed today, states he frequently has blood in his stool. Patient is followed by GI, last endoscopy was about 6 weeks ago, reports 5 stomach ulcers. Patient is scheduled for an MRI of the pancrease on 02/24/20 for questionable mass on his pancreas with plan for biopsy. Patient came in last night because he had pain from his chin to his epigastric area, described as tight in nature, intermittent, denies associated nausea, vomiting, diaphoresis. Patient was given nitro for his pain which did not resolve his pain. Patient also reports loss of sense of smell and taste since yesterday, no history of COVID, has not been vaccinated.  [name] was evaluated in Emergency Department on 02/16/2020 for the symptoms described in the history of present illness. He was evaluated in the context of the global COVID-19 pandemic, which necessitated consideration that the patient might be at risk for infection with the SARS-CoV-2 virus that causes COVID-19. Institutional protocols and algorithms that pertain to the evaluation of patients at risk for COVID-19 are in a state of rapid change based on information released by regulatory bodies including the CDC and federal and state organizations. These policies and algorithms were followed during the patient's  care in the ED.         Past Medical History:  Diagnosis Date  . Alcoholism /alcohol abuse ([place])   . Anemia   . Anxiety   . Arthritis    "knees; arms; elbows" (03/26/2015)  . Asthma   . Bipolar disorder ([place])   . Chronic bronchitis ([place])   . Chronic lower back pain   . Chronic pancreatitis ([place])   . Cocaine abuse ([place])   . Depression   . Family history of adverse reaction to anesthesia    "grandmother gets confused"  . Femoral condyle fracture ([place]) 03/08/2014   left medial/notes 03/09/2014  . GERD (gastroesophageal reflux disease)   . H/O hiatal hernia   . H/O suicide attempt 10/2012  . High cholesterol   . History of blood transfusion 10/2012   "when I tried to commit suicide"  . History of stomach ulcers   . Hypertension   . Marijuana abuse, continuous   . Migraine    "a few times/year" (03/26/2015)  . Pneumonia 1990's X 3  . PTSD (post-traumatic stress disorder)   . Sickle cell trait ([place])   . WPW (Wolff-Parkinson-White syndrome)    [name] 03/06/2013    Patient Active Problem List   Diagnosis Date Noted  . AKI (acute kidney injury) ([place]) 11/13/2018  . Seizure ([place]) 11/13/2018  . Gastritis and gastroduodenitis   . Chest pain 01/08/2018  . GI bleed 11/24/2017  . Acute blood loss anemia 11/24/2017  . Atypical chest pain 11/24/2017  . Acute pancreatitis 09/28/2017  . Abdominal pain 05/27/2017  . Hematemesis 05/27/2017  . Tachycardia 03/18/2017  . Diarrhea 03/18/2017  . Acute on chronic pancreatitis (  [place]) 12/17/2016  . Intractable nausea and vomiting 12/05/2016  . Verbally abusive behavior 12/05/2016  . Normocytic anemia 12/05/2016  . Alcohol use disorder, severe, dependence ([place]) 07/25/2016  . Cocaine use disorder, severe, dependence ([place]) 07/25/2016  . 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 Leukocytosis   . Hospital acquired PNA 05/20/2015  . Chronic pancreatitis ([place]) 05/18/2015  . Pseudocyst of  pancreas 05/18/2015  . Polysubstance abuse (tobacco, cocaine, THC, and ETOH) 03/26/2015  . Alcohol-induced chronic pancreatitis ([place])   . Essential hypertension 02/06/2014  . Mood disorder ([place]) 02/06/2014  . Alcohol-induced acute pancreatitis 11/28/2013  . Pancreatic pseudocyst/cyst 11/25/2013  . Severe protein-calorie malnutrition ([place]) 10/10/2013  . Suicide attempt ([place]) 10/08/2013  . 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 TOBACCO ABUSE 03/23/2007    Past Surgical History:  Procedure Laterality Date  . BIOPSY  11/25/2017   Procedure: BIOPSY;  Surgeon: Arta Silence, MD;  Location: [facility] ENDOSCOPY;  Service: Endoscopy;;  . BIOPSY  10/14/2018   Procedure: BIOPSY;  Surgeon: Arta Silence, MD;  Location: [facility];  Service: Endoscopy;;  . CARDIAC CATHETERIZATION    . CYST ENTEROSTOMY  01/02/2020   Procedure: CYST ASPIRATION;  Surgeon: Milus Banister, MD;  Location: [facility] ENDOSCOPY;  Service: Endoscopy;;  . ESOPHAGOGASTRODUODENOSCOPY (EGD) WITH PROPOFOL N/A 11/25/2017   Procedure: ESOPHAGOGASTRODUODENOSCOPY (EGD) WITH PROPOFOL;  Surgeon: Arta Silence, MD;  Location: [facility];  Service: Endoscopy;  Laterality: N/A;  . ESOPHAGOGASTRODUODENOSCOPY (EGD) WITH PROPOFOL Left 10/14/2018   Procedure: ESOPHAGOGASTRODUODENOSCOPY (EGD) WITH PROPOFOL;  Surgeon: Arta Silence, MD;  Location[facility] ENDOSCOPY;  Service: Endoscopy;  Laterality: Left;  . ESOPHAGOGASTRODUODENOSCOPY (EGD) WITH PROPOFOL N/A 11/14/2018   Procedure: ESOPHAGOGASTRODUODENOSCOPY (EGD) WITH PROPOFOL;  Surgeon: Laurence Spates, MD;  Location: [facility] ENDOSCOPY;  Service: Gastroenterology;  Laterality: N/A;  . ESOPHAGOGASTRODUODENOSCOPY (EGD) WITH PROPOFOL N/A 01/02/2020   Procedure: ESOPHAGOGASTRODUODENOSCOPY (EGD) WITH PROPOFOL;  Surgeon: Milus Banister, MD;  Location: [facility] ENDOSCOPY;  Service: Endoscopy;  Laterality: N/A;  . EUS N/A 01/02/2020   Procedure: UPPER ENDOSCOPIC ULTRASOUND (EUS) RADIAL;   Surgeon: Milus Banister, MD;  Location: [facility] ENDOSCOPY;  Service: Endoscopy;  Laterality: N/A;  . EYE SURGERY Left 1990's   "result of trauma"   . FACIAL FRACTURE SURGERY Left 1990's   "result of trauma"   . FRACTURE SURGERY    . HERNIA REPAIR    . LEFT HEART CATHETERIZATION WITH CORONARY ANGIOGRAM Right 03/07/2013   Procedure: LEFT HEART CATHETERIZATION WITH CORONARY ANGIOGRAM;  Surgeon: [name], MD;  Location: [facility] CATH LAB;  Service: Cardiovascular;  Laterality: Right;  . UMBILICAL HERNIA REPAIR    . UPPER GASTROINTESTINAL ENDOSCOPY         Family History  Problem Relation Age of Onset  . Hypertension Mother   . Cirrhosis Mother   . Alcoholism Mother   . Hypertension Father   . Melanoma Father   . Hypertension Other   . Coronary artery disease Other     Social History   Tobacco Use  . Smoking status: Current Every Day Smoker    Packs/day: 1.00    Years: 33.00    Pack years: 33.00    Types: Cigarettes, E-cigarettes  . Smokeless tobacco: Never Used  Vaping Use  . Vaping Use: Former  Substance Use Topics  . Alcohol use: Yes  . Drug use: Yes    Types: Marijuana, Cocaine    Comment: daily marijuana use; last cocaine use about 3 months ago  Home Medications Prior to Admission medications   Medication Sig Start Date End Date Taking? Authorizing Provider  acetaminophen (TYLENOL) 500 MG tablet Take 2 tablets (1,000 mg total) by mouth every 8 (eight) hours as needed for mild pain. 08/29/19   Elodia Florence., MD  albuterol (VENTOLIN HFA) 108 (90 Base) MCG/ACT inhaler Inhale 2 puffs into the lungs every 4 (four) hours as needed for wheezing or shortness of breath. 11/12/19   [name], DO  chlordiazePOXIDE (LIBRIUM) 10 MG capsule Take 1 capsule (10 mg total) by mouth 3 (three) times daily as needed for anxiety or withdrawal. Patient not taking: Reported on 11/13/2019 09/06/19   Fulp, Ander Gaster, MD  ciprofloxacin (CIPRO) 500 MG tablet Take 1 tablet (500 mg total) by  mouth 2 (two) times daily. 01/02/20   Milus Banister, MD  Cyanocobalamin (VITAMIN B-12 PO) Take 1 tablet by mouth daily.    [provider]  cyclobenzaprine (FLEXERIL) 10 MG tablet Take 1 tablet (10 mg total) by mouth 2 (two) times daily as needed for muscle spasms. 03/03/19   Recardo Evangelist, PA-C  dexlansoprazole (DEXILANT) 60 MG capsule Take 1 capsule (60 mg total) by mouth daily. 11/13/19   [name], [name], MD  famotidine (PEPCID) 20 MG tablet Take 1 tablet (20 mg total) by mouth 2 (two) times daily. 08/08/19   Carlisle Cater, PA-C  fluconazole (DIFLUCAN) 200 MG tablet Take 400 mg on day 1, then take 200 mg a day for the next 13 days Patient not taking: Reported on 12/27/2019 11/13/19   Yetta Flock, MD  fluticasone furoate-vilanterol (BREO ELLIPTA) 200-25 MCG/INH AEPB Inhale 1 puff into the lungs daily. Patient not taking: Reported on 12/27/2019 11/12/19   Noemi Chapel P, DO  fluticasone furoate-vilanterol (BREO ELLIPTA) 200-25 MCG/INH AEPB Inhale 1 puff into the lungs daily. 11/12/19   [name], DO  folic acid (FOLVITE) 1 MG tablet Take 1 tablet (1 mg total) by mouth daily. 11/26/17   [name], MD  hydrALAZINE (APRESOLINE) 25 MG tablet Take 1 tablet (25 mg total) by mouth 3 (three) times daily. 09/09/19   Fulp, Cammie, MD  hydrOXYzine (VISTARIL) 25 MG capsule Take 1 capsule (25 mg total) by mouth 3 (three) times daily as needed for anxiety. 11/28/19   Camillia Herter, NP  lidocaine (XYLOCAINE) 2 % solution USE AS DIRECTED( 15 ML IN THE MOUTH OR THROAT AS NEEDED FOR MOUTH PAIN) 01/02/20   Milus Banister, MD  lipase/protease/amylase (CREON) 36000 UNITS CPEP capsule Take 2 capsules (72,000 Units total) by mouth 3 (three) times daily with meals. For pancreatitis 11/05/19   [name], [name], MD  loperamide (IMODIUM A-D) 2 MG tablet Take 2 mg by mouth 4 (four) times daily as needed for diarrhea or loose stools. Patient not taking: Reported on 12/27/2019    [provider]  LORazepam (ATIVAN) 0.5 MG tablet Take 0.5 mg by mouth every 8 (eight) hours as needed for anxiety.  Patient not taking: Reported on 12/27/2019    [provider]  metoprolol tartrate (LOPRESSOR) 100 MG tablet Take 1 tablet (100 mg total) by mouth 2 (two) times daily. To lower blood pressure 12/18/19   [name], MD  Multiple Vitamin (MULTIVITAMIN WITH MINERALS) TABS tablet Take 1 tablet by mouth daily. 09/06/17   Bonnell Public, MD  NASONEX 50 MCG/ACT nasal spray Place 2 sprays into the nose daily as needed (allergies).  06/19/19   [provider]  ondansetron (  ZOFRAN ODT) 4 MG disintegrating tablet Take 1 tablet (4 mg total) by mouth every 8 (eight) hours as needed for nausea or vomiting. Patient not taking: Reported on 12/27/2019 10/06/19   Tedd Sias, PA  pantoprazole (PROTONIX) 40 MG tablet Take 1 tablet (40 mg total) by mouth 2 (two) times daily. Patient not taking: Reported on 11/17/2019 11/05/19   Yetta Flock, MD  potassium chloride SA (K-DUR) 20 MEQ tablet Take 1 tablet (20 mEq total) by mouth daily. Patient taking differently: Take 20 mEq by mouth daily as needed (potassium).  01/07/19   Sherwood Gambler, MD  QUEtiapine (SEROQUEL) 50 MG tablet Take 1 tablet (50 mg total) by mouth at bedtime. 11/28/19   Camillia Herter, NP  sertraline (ZOLOFT) 100 MG tablet Take 1 tablet (100 mg total) by mouth daily. 11/28/19   Camillia Herter, NP  sucralfate (CARAFATE) 1 g tablet Take 1 tablet (1 g total) by mouth 4 (four) times daily -  with meals and at bedtime. 11/05/19   [name], [name], MD  thiamine 100 MG tablet Take 1 tablet (100 mg total) by mouth daily. Patient not taking: Reported on 12/27/2019 10/16/18   [name], MD  traMADol (ULTRAM) 50 MG tablet Take 1 tablet (50 mg total) by mouth every 8 (eight) hours as needed for severe pain. Patient not taking: Reported on 12/27/2019 11/14/19   Yetta Flock, MD  amitriptyline (ELAVIL) 25 MG  tablet Take 1 tablet (25 mg total) by mouth at bedtime. Patient not taking: Reported on 08/08/2019 10/15/18 08/08/19  [name], MD  gabapentin (NEURONTIN) 100 MG capsule Take 1 capsule (100 mg total) by mouth 2 (two) times daily. Patient not taking: Reported on 08/08/2019 10/15/18 08/08/19  [name], MD  promethazine (PHENERGAN) 25 MG tablet Take 1 tablet (25 mg total) by mouth every 6 (six) hours as needed for nausea or vomiting. Patient not taking: Reported on 08/08/2019 03/02/19 08/08/19  Kinnie Feil, PA-C    Allergies    Robaxin [methocarbamol], Shellfish-derived products, Trazodone, Trazodone and nefazodone, Adhesive [tape], Latex, Toradol [ketorolac tromethamine], Contrast media [iodinated diagnostic agents], and Reglan [metoclopramide]  Review of Systems   Review of Systems  Constitutional: Negative for fever.  HENT: Negative for congestion.   Respiratory: Positive for chest tightness. Negative for cough and shortness of breath.   Cardiovascular: Positive for chest pain. Negative for palpitations and leg swelling.  Gastrointestinal: Positive for abdominal pain, blood in stool and diarrhea. Negative for constipation, nausea and vomiting.  Genitourinary: Negative for difficulty urinating and dysuria.  Musculoskeletal: Negative for arthralgias and myalgias.  Skin: Negative for rash and wound.  Allergic/Immunologic: Negative for immunocompromised state.  Neurological: Negative for weakness.  Hematological: Negative for adenopathy.  Psychiatric/Behavioral: Negative for confusion.  All other systems reviewed and are negative.   Physical Exam Updated Vital Signs BP 134/89   Pulse 65   Temp 98.4 F (36.9 C) (Oral)   Resp (!) 26   Ht 5\' 8"  (1.727 m)   Wt 59 kg   SpO2 99%   BMI 19.77 kg/m   Physical Exam Vitals and nursing note reviewed.  Constitutional:      General: He is not in acute distress.    Appearance: He is well-developed. He is not diaphoretic.  HENT:      Head: Normocephalic and atraumatic.  Cardiovascular:     Rate and Rhythm: Normal rate and regular rhythm.     Heart sounds: Normal heart sounds.  Pulmonary:     Effort: Pulmonary effort is normal.     Breath sounds: Normal breath sounds.  Chest:     Chest wall: No tenderness.  Abdominal:     Palpations: Abdomen is soft.     Tenderness: There is generalized abdominal tenderness.  Musculoskeletal:     Right lower leg: No edema.     Left lower leg: No edema.  Skin:    General: Skin is warm and dry.     Findings: No erythema or rash.  Neurological:     Mental Status: He is alert and oriented to person, place, and time.  Psychiatric:        Behavior: Behavior normal.     ED Results / Procedures / Treatments   Labs (all labs ordered are listed, but only abnormal results are displayed) Labs Reviewed  BASIC METABOLIC PANEL - Abnormal; Notable for the following components:      Result Value   CO2 20 (*)    Glucose, Bld 106 (*)    Calcium 8.7 (*)    All other components within normal limits  CBC - Abnormal; Notable for the following components:   RBC 3.55 (*)    Hemoglobin 11.6 (*)    HCT 35.5 (*)    RDW 16.6 (*)    All other components within normal limits  HEPATIC FUNCTION PANEL - Abnormal; Notable for the following components:   Albumin 3.2 (*)    AST 76 (*)    ALT 53 (*)    All other components within normal limits  HEMOGLOBIN AND HEMATOCRIT, BLOOD - Abnormal; Notable for the following components:   Hemoglobin 11.4 (*)    HCT 35.0 (*)    All other components within normal limits  RESPIRATORY PANEL BY RT PCR (FLU A&B, COVID)  LIPASE, BLOOD  TROPONIN I (HIGH SENSITIVITY)  TROPONIN I (HIGH SENSITIVITY)    EKG EKG Interpretation  Date/Time:  Saturday February 15 2020 19:01:34 EDT Ventricular Rate:  90 PR Interval:  102 QRS Duration: 74 QT Interval:  348 QTC Calculation: 425 R Axis:   69 Text Interpretation: Sinus rhythm with short PR Right atrial enlargement  Borderline ECG No STEMI Confirmed by Nanda Quinton [id number]) on 02/16/2020 6:24:12 AM   Radiology DG Chest 2 View  Result Date: 02/15/2020 CLINICAL DATA:  Centralized chest pain. EXAM: CHEST - 2 VIEW COMPARISON:  Chest radiograph 711 2021. FINDINGS: The heart size and mediastinal contours are within normal limits. Both lungs are clear. The visualized skeletal structures are unremarkable. IMPRESSION: No active cardiopulmonary disease. Electronically Signed   By: Lovey Newcomer M.D.   On: 02/15/2020 19:32    Procedures Procedures (including critical care time)  Medications Ordered in ED Medications  alum & mag hydroxide-simeth (MAALOX/MYLANTA) 200-200-20 MG/5ML suspension 30 mL (30 mLs Oral Given 02/16/20 0751)    And  lidocaine (XYLOCAINE) 2 % viscous mouth solution 15 mL (15 mLs Oral Given 02/16/20 0751)    ED Course  I have reviewed the triage vital signs and the nursing notes.  Pertinent labs & imaging results that were available during my care of the patient were reviewed by me and considered in my medical decision making (see chart for details).  Clinical Course as of Feb 16 943  Sun Feb 15, 9261  3961 49 year old male presents with acute on chronic abdominal pain with complaint of chest tightness with additional as above. On exam, patient is well-appearing, lung sounds are clear, heart regular rate and rhythm, abdomen with  mild generalized tenderness. Vitals reviewed and stable, labs reviewed hemoglobin is 11.6, no significant change from prior, repeat and unchanged.  Lipase within normal limits, troponin x2 with results of 4 and 7.  EKG without acute ischemic changes.  BMP and hepatic function reviewed, mild increase in LFTs compared to prior. Patient was given GI cocktail as requested, symptoms have resolved and he is asking for [place] sandwich and ready for discharge.  Recommend that patient follow-up with his GI.  Return to ER for worsening or concerning symptoms.   [LM]      Clinical Course User Index [LM] Roque Lias   MDM Rules/Calculators/A&P                          Final Clinical Impression(s) / ED Diagnoses Final diagnoses:  Generalized abdominal pain    Rx / DC Orders ED Discharge Orders    None       Tacy Learn, PA-C 02/16/20 8115    Gareth Morgan, MD 02/18/20 1528

## 2020-02-17 ENCOUNTER — Telehealth: Payer: Self-pay

## 2020-02-17 NOTE — Telephone Encounter (Signed)
Jason Flock, MD  Yevette Edwards, RN [name] this patient was seen in the ED, needs routine follow up   Called patient twice, phone rings then busy signal. Will attempt later.  Patient is scheduled for a routine follow up on 04/07/20 at 9:50 AM.

## 2020-02-18 NOTE — Telephone Encounter (Signed)
Called patient, fast busy signal. Unable to leave a voicemail. Will mail letter today with appointment information.

## 2020-02-21 ENCOUNTER — Telehealth: Payer: Self-pay | Admitting: Gastroenterology

## 2020-02-24 ENCOUNTER — Other Ambulatory Visit: Payer: Self-pay

## 2020-02-24 ENCOUNTER — Encounter: Payer: Self-pay | Admitting: Acute Care

## 2020-02-24 NOTE — Patient Instructions (Signed)
Thank you for participating in the [facility]. It was our pleasure to meet you today. We will call you with the results of your scan within the next few days. Your scan will be assigned a Lung RADS category score by the physicians reading the scans.  This Lung RADS score determines follow up scanning.  See below for description of categories, and follow up screening recommendations. We will be in touch to schedule your follow up screening annually or based on recommendations of our providers. We will fax a copy of your scan results to your Primary Care Physician, or the physician who referred you to the program, to ensure they have the results. Please call the office if you have any questions or concerns regarding your scanning experience or results.  Our office number is [phone]. Please speak with Denise Phelps, RN. She is our Lung Cancer Screening RN. If she is unavailable when you call, please have the office staff send her a message. She will return your call at her earliest convenience. Remember, if your scan is normal, we will scan you annually as long as you continue to meet the criteria for the program. (Age 50-77, Current smoker or smoker who has quit within the last 15 years). If you are a smoker, remember, quitting is the single most powerful action that you can take to decrease your risk of lung cancer and other pulmonary, breathing related problems. We know quitting is hard, and we are here to help.  Please let us know if there is anything we can do to help you meet your goal of quitting. If you are a former smoker, congratulations. We are proud of you! Remain smoke free! Remember you can refer friends or family members through the number above.  We will screen them to make sure they meet criteria for the program. Thank you for helping us take better care of you by participating in Lung Screening.  Lung RADS Categories:  Lung RADS 1: no nodules  or definitely non-concerning nodules.  Recommendation is for a repeat annual scan in 12 months.  Lung RADS 2:  nodules that are non-concerning in appearance and behavior with a very low likelihood of becoming an active cancer. Recommendation is for a repeat annual scan in 12 months.  Lung RADS 3: nodules that are probably non-concerning , includes nodules with a low likelihood of becoming an active cancer.  Recommendation is for a 6-month repeat screening scan. Often noted after an upper respiratory illness. We will be in touch to make sure you have no questions, and to schedule your 6-month scan.  Lung RADS 4 A: nodules with concerning findings, recommendation is most often for a follow up scan in 3 months or additional testing based on our provider's assessment of the scan. We will be in touch to make sure you have no questions and to schedule the recommended 3 month follow up scan.  Lung RADS 4 B:  indicates findings that are concerning. We will be in touch with you to schedule additional diagnostic testing based on our provider's  assessment of the scan.   

## 2020-02-25 ENCOUNTER — Other Ambulatory Visit: Payer: Self-pay

## 2020-02-25 NOTE — Telephone Encounter (Signed)
Spoke with patient regarding pain management referral.  Referral and records have been faxed to [facility]. Patient is aware. Advised him to give them about a week to get in contact with him to schedule and appt. Pt states that he is waiting on his Medicaid card, he states that he is going to call Medicaid to see if he can get this expedited. Advised patient to give me a call if he has any other concerns. Pt verbalized understanding and has no other concerns at this time.

## 2020-03-05 ENCOUNTER — Other Ambulatory Visit: Payer: Self-pay

## 2020-03-05 ENCOUNTER — Emergency Department (HOSPITAL_COMMUNITY): Payer: Medicaid Other

## 2020-03-05 ENCOUNTER — Encounter (HOSPITAL_COMMUNITY): Payer: Self-pay

## 2020-03-05 ENCOUNTER — Emergency Department (HOSPITAL_COMMUNITY)
Admission: EM | Admit: 2020-03-05 | Discharge: 2020-03-05 | Disposition: A | Discharge status: Home / Self Care | Payer: Medicaid Other | Attending: Emergency Medicine | Admitting: Emergency Medicine

## 2020-03-05 DIAGNOSIS — Z79899 Other long term (current) drug therapy: Secondary | ICD-10-CM | POA: Diagnosis not present

## 2020-03-05 DIAGNOSIS — S0181XA Laceration without foreign body of other part of head, initial encounter: Secondary | ICD-10-CM | POA: Diagnosis not present

## 2020-03-05 DIAGNOSIS — Z955 Presence of coronary angioplasty implant and graft: Secondary | ICD-10-CM | POA: Diagnosis not present

## 2020-03-05 DIAGNOSIS — I10 Essential (primary) hypertension: Secondary | ICD-10-CM | POA: Diagnosis not present

## 2020-03-05 DIAGNOSIS — W19XXXA Unspecified fall, initial encounter: Secondary | ICD-10-CM | POA: Diagnosis not present

## 2020-03-05 DIAGNOSIS — J45909 Unspecified asthma, uncomplicated: Secondary | ICD-10-CM | POA: Diagnosis not present

## 2020-03-05 DIAGNOSIS — S02611B Fracture of condylar process of right mandible, initial encounter for open fracture: Secondary | ICD-10-CM | POA: Insufficient documentation

## 2020-03-05 DIAGNOSIS — Y92009 Unspecified place in unspecified non-institutional (private) residence as the place of occurrence of the external cause: Secondary | ICD-10-CM | POA: Insufficient documentation

## 2020-03-05 DIAGNOSIS — Z91040 Latex allergy status: Secondary | ICD-10-CM | POA: Diagnosis not present

## 2020-03-05 DIAGNOSIS — Z7951 Long term (current) use of inhaled steroids: Secondary | ICD-10-CM | POA: Insufficient documentation

## 2020-03-05 DIAGNOSIS — Y9301 Activity, walking, marching and hiking: Secondary | ICD-10-CM | POA: Diagnosis not present

## 2020-03-05 DIAGNOSIS — R55 Syncope and collapse: Secondary | ICD-10-CM | POA: Diagnosis not present

## 2020-03-05 DIAGNOSIS — F17210 Nicotine dependence, cigarettes, uncomplicated: Secondary | ICD-10-CM | POA: Insufficient documentation

## 2020-03-05 DIAGNOSIS — S0990XA Unspecified injury of head, initial encounter: Secondary | ICD-10-CM | POA: Diagnosis present

## 2020-03-05 LAB — I-STAT CHEM 8, ED
BUN: 7 mg/dL (ref 6–20)
Calcium, Ion: 0.86 mmol/L — CL (ref 1.15–1.40)
Chloride: 109 mmol/L (ref 98–111)
Creatinine, Ser: 0.5 mg/dL — ABNORMAL LOW (ref 0.61–1.24)
Glucose, Bld: 80 mg/dL (ref 70–99)
HCT: 42 % (ref 39.0–52.0)
Hemoglobin: 14.3 g/dL (ref 13.0–17.0)
Potassium: 4.1 mmol/L (ref 3.5–5.1)
Sodium: 136 mmol/L (ref 135–145)
TCO2: 19 mmol/L — ABNORMAL LOW (ref 22–32)

## 2020-03-05 LAB — CBC WITH DIFFERENTIAL/PLATELET
Abs Immature Granulocytes: 0.04 10*3/uL (ref 0.00–0.07)
Basophils Absolute: 0 10*3/uL (ref 0.0–0.1)
Basophils Relative: 0 %
Eosinophils Absolute: 0 10*3/uL (ref 0.0–0.5)
Eosinophils Relative: 1 %
HCT: 36.5 % — ABNORMAL LOW (ref 39.0–52.0)
Hemoglobin: 12.1 g/dL — ABNORMAL LOW (ref 13.0–17.0)
Immature Granulocytes: 1 %
Lymphocytes Relative: 25 %
Lymphs Abs: 2 10*3/uL (ref 0.7–4.0)
MCH: 33.5 pg (ref 26.0–34.0)
MCHC: 33.2 g/dL (ref 30.0–36.0)
MCV: 101.1 fL — ABNORMAL HIGH (ref 80.0–100.0)
Monocytes Absolute: 0.8 10*3/uL (ref 0.1–1.0)
Monocytes Relative: 10 %
Neutro Abs: 5 10*3/uL (ref 1.7–7.7)
Neutrophils Relative %: 63 %
Platelets: 228 10*3/uL (ref 150–400)
RBC: 3.61 MIL/uL — ABNORMAL LOW (ref 4.22–5.81)
RDW: 14.3 % (ref 11.5–15.5)
WBC: 7.9 10*3/uL (ref 4.0–10.5)
nRBC: 0 % (ref 0.0–0.2)

## 2020-03-05 LAB — COMPREHENSIVE METABOLIC PANEL
ALT: 58 U/L — ABNORMAL HIGH (ref 0–44)
AST: 81 U/L — ABNORMAL HIGH (ref 15–41)
Albumin: 3.3 g/dL — ABNORMAL LOW (ref 3.5–5.0)
Alkaline Phosphatase: 74 U/L (ref 38–126)
Anion gap: 9 (ref 5–15)
BUN: 8 mg/dL (ref 6–20)
CO2: 21 mmol/L — ABNORMAL LOW (ref 22–32)
Calcium: 7.9 mg/dL — ABNORMAL LOW (ref 8.9–10.3)
Chloride: 107 mmol/L (ref 98–111)
Creatinine, Ser: 0.62 mg/dL (ref 0.61–1.24)
GFR, Estimated: >60 mL/min (ref >60)
Glucose, Bld: 75 mg/dL (ref 70–99)
Potassium: 6 mmol/L — ABNORMAL HIGH (ref 3.5–5.1)
Sodium: 137 mmol/L (ref 135–145)
Total Bilirubin: 1.1 mg/dL (ref 0.3–1.2)
Total Protein: 6 g/dL — ABNORMAL LOW (ref 6.5–8.1)

## 2020-03-05 LAB — TROPONIN I (HIGH SENSITIVITY)
Troponin I (High Sensitivity): 5 ng/L (ref <18)
Troponin I (High Sensitivity): 5 ng/L (ref <18)

## 2020-03-05 MED ORDER — HYDROCODONE-ACETAMINOPHEN 5-325 MG PO TABS
1.0000 | ORAL_TABLET | Freq: Four times a day (QID) | ORAL | 0 refills | Status: DC | PRN
Start: 2020-03-05 — End: 2020-04-07

## 2020-03-05 MED ORDER — SODIUM CHLORIDE 0.9 % IV BOLUS
1000.0000 mL | Freq: Once | INTRAVENOUS | Status: AC
  Administered 2020-03-05: 1000 mL via INTRAVENOUS

## 2020-03-05 MED ORDER — ACETAMINOPHEN 500 MG PO TABS
1000.0000 mg | ORAL_TABLET | Freq: Once | ORAL | Status: AC
  Administered 2020-03-05: 1000 mg via ORAL
  Filled 2020-03-05: qty 2

## 2020-03-05 MED ORDER — FENTANYL CITRATE (PF) 100 MCG/2ML IJ SOLN
25.0000 ug | Freq: Once | INTRAMUSCULAR | Status: AC
  Administered 2020-03-05: 25 ug via INTRAVENOUS
  Filled 2020-03-05: qty 2

## 2020-03-05 MED ORDER — LIDOCAINE VISCOUS HCL 2 % MT SOLN: 2.0000 mL | OROMUCOSAL | 0 refills | Status: DC | PRN

## 2020-03-05 MED ORDER — ONDANSETRON HCL 4 MG/2ML IJ SOLN
4.0000 mg | Freq: Once | INTRAMUSCULAR | Status: AC
  Administered 2020-03-05: 4 mg via INTRAVENOUS
  Filled 2020-03-05: qty 2

## 2020-03-05 MED ORDER — LIDOCAINE VISCOUS HCL 2 % MT SOLN
15.0000 mL | Freq: Once | OROMUCOSAL | Status: AC
  Administered 2020-03-05: 15 mL via ORAL
  Filled 2020-03-05: qty 15

## 2020-03-05 MED ORDER — ALUM & MAG HYDROXIDE-SIMETH 200-200-20 MG/5ML PO SUSP
30.0000 mL | Freq: Once | ORAL | Status: AC
  Administered 2020-03-05: 30 mL via ORAL
  Filled 2020-03-05: qty 30

## 2020-03-05 NOTE — Care Management (Signed)
ED CM received consult to assist with Dental appointment from ED, CM faxed ED Referral to Dr. Walden Field in [place] fax [phone]. Patient will call tomorrow to schedule appointment, ED SW provided information to arrange transportation to appointment.

## 2020-03-05 NOTE — Social Work (Signed)
CSW met with [name] at bedside and provided the telephone # for [facility] so that [name] can call tomorrow to request assistance with transportation to follow-up dental appointment on 03/06/20. CSW also provided sack lunch per [name] request.

## 2020-03-05 NOTE — ED Provider Notes (Signed)
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 are missing.  He recommends that I discussed with dentistry to see if we get outpatient follow-up.  I discussed with Dr. Walden Field (Dentist). He is willing to see patient in the office tomorrow.   I-STAT Chem-8 shows potassium of 4.1.  Delta troponin is negative.  At this time, patient is hemodynamically stable.  He has been ambulatory in the ED without any signs of gait ataxia.  He is hemodynamically stable.  He was drinking considerably last night when he fell.  Question of this was from fall from alcohol.  At this time, his work-up was unremarkable.  We will plan to have him follow-up with dentist as well as ENT. At this time, patient exhibits no emergent life-threatening condition that require further evaluation in ED. we will give him a short course of pain medication for acute breakthrough pain.  Review of PMP showed he had a small course of pain medication done on July 2021.  Patient had ample opportunity for questions and discussion. All patient's questions were answered with full understanding. Strict return precautions discussed.  Patient expresses understanding and agreement to plan.   Portions of this note were generated with Lobbyist. Dictation errors may occur despite best attempts at proofreading.   Final Clinical Impression(s) / ED Diagnoses Final diagnoses:  Fall, initial encounter  Chin laceration, initial encounter  Open fracture of right condylar process of mandible, initial encounter [facility])    Rx / DC Orders ED Discharge Orders         Ordered    HYDROcodone-acetaminophen (NORCO/VICODIN) 5-325 MG tablet  Every 6 hours PRN        03/05/20 1904    lidocaine (XYLOCAINE) 2 % solution  As needed        03/05/20 1904           Desma Mcgregor 03/07/20 [place], [place], DO 03/07/20 1516
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
## 2020-03-05 NOTE — Discharge Instructions (Signed)
As we discussed, it is important you follow-up with ear nose and throat doctor as well as the dentist.  The information is provided in the paperwork.  Take viscous lidocaine as directed.  Take pain medication as directed.  Return the emergency department for any chest pain, difficulty breathing, redness or swelling around the wound, drainage from wound, fevers or any other worsening concerning symptoms.

## 2020-03-05 NOTE — ED Notes (Signed)
Pt requested to speak to social work about financial assistance w emergency dental appt, social work requested, pt then complained about wait time to speak w social work. States he wanted to "walk out for a little bit and come back". Advised pt he could not since he has an IV in his arm and social work would be here soon. Pt complaining and seems frustrated.
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
## 2020-03-05 NOTE — ED Notes (Signed)
Social work here to see pt

## 2020-03-05 NOTE — Care Management (Addendum)
° °  Name: [name] DOB: 08-03-1970   ED Referral Summary with [place]  Name  [name]  Department: [facility].  MRN:  [id number]   Emergency Dept.  Date of Visit:  03/05/20  was seen in the ED and was advised to follow up with you as necessary. It Is the sole responsibility of the patient to arrange for an appointment with you or your practice.  This notification in no way is meant to establish a doctor-patient relationship.

## 2020-03-06 ENCOUNTER — Other Ambulatory Visit: Payer: Self-pay | Admitting: Internal Medicine

## 2020-03-06 NOTE — Telephone Encounter (Signed)
   Notes to clinic:  medication prescribed by a different provider Review for refill Patient is out of medication    Requested Prescriptions  Pending Prescriptions Disp Refills   lidocaine (XYLOCAINE) 2 % solution 10 mL 0    Sig: Use as directed 2 mLs in the mouth or throat as needed for mouth pain.      Off-Protocol Failed - 03/06/2020 10:54 AM      Failed - Medication not assigned to a protocol, review manually.      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

## 2020-03-06 NOTE — Telephone Encounter (Signed)
Medication Refill - Medication: lidocaine (XYLOCAINE) 2 % solution  Patient stated he is all out of this medication and needs it urgently.      Preferred Pharmacy (with phone number or street name):  [facility], [address] Phone:  [phone]  Fax:  [phone]       Agent: Please be advised that RX refills may take up to 3 business days. We ask that you follow-up with your pharmacy.

## 2020-03-10 ENCOUNTER — Other Ambulatory Visit: Payer: Self-pay

## 2020-03-10 ENCOUNTER — Telehealth: Payer: Self-pay

## 2020-03-10 ENCOUNTER — Emergency Department (HOSPITAL_COMMUNITY): Payer: Medicaid Other

## 2020-03-10 ENCOUNTER — Encounter (HOSPITAL_COMMUNITY): Payer: Self-pay | Admitting: Emergency Medicine

## 2020-03-10 ENCOUNTER — Emergency Department (HOSPITAL_COMMUNITY)
Admission: EM | Admit: 2020-03-10 | Discharge: 2020-03-11 | Disposition: A | Discharge status: Home / Self Care | Payer: Medicaid Other | Attending: Emergency Medicine | Admitting: Emergency Medicine

## 2020-03-10 DIAGNOSIS — R6884 Jaw pain: Secondary | ICD-10-CM | POA: Diagnosis not present

## 2020-03-10 DIAGNOSIS — R079 Chest pain, unspecified: Secondary | ICD-10-CM | POA: Insufficient documentation

## 2020-03-10 DIAGNOSIS — K0889 Other specified disorders of teeth and supporting structures: Secondary | ICD-10-CM | POA: Diagnosis not present

## 2020-03-10 DIAGNOSIS — I10 Essential (primary) hypertension: Secondary | ICD-10-CM | POA: Diagnosis not present

## 2020-03-10 DIAGNOSIS — Z7951 Long term (current) use of inhaled steroids: Secondary | ICD-10-CM | POA: Diagnosis not present

## 2020-03-10 DIAGNOSIS — F17210 Nicotine dependence, cigarettes, uncomplicated: Secondary | ICD-10-CM | POA: Insufficient documentation

## 2020-03-10 DIAGNOSIS — K029 Dental caries, unspecified: Secondary | ICD-10-CM | POA: Insufficient documentation

## 2020-03-10 DIAGNOSIS — Z79899 Other long term (current) drug therapy: Secondary | ICD-10-CM | POA: Insufficient documentation

## 2020-03-10 DIAGNOSIS — Z91040 Latex allergy status: Secondary | ICD-10-CM | POA: Insufficient documentation

## 2020-03-10 DIAGNOSIS — J45909 Unspecified asthma, uncomplicated: Secondary | ICD-10-CM | POA: Insufficient documentation

## 2020-03-10 LAB — CBC
HCT: 36 % — ABNORMAL LOW (ref 39.0–52.0)
Hemoglobin: 11.6 g/dL — ABNORMAL LOW (ref 13.0–17.0)
MCH: 32.9 pg (ref 26.0–34.0)
MCHC: 32.2 g/dL (ref 30.0–36.0)
MCV: 102 fL — ABNORMAL HIGH (ref 80.0–100.0)
Platelets: 200 10*3/uL (ref 150–400)
RBC: 3.53 MIL/uL — ABNORMAL LOW (ref 4.22–5.81)
RDW: 13.5 % (ref 11.5–15.5)
WBC: 6.6 10*3/uL (ref 4.0–10.5)
nRBC: 0 % (ref 0.0–0.2)

## 2020-03-10 LAB — PROTIME-INR
INR: 1 (ref 0.8–1.2)
Prothrombin Time: 13.2 seconds (ref 11.4–15.2)

## 2020-03-10 LAB — BASIC METABOLIC PANEL
Anion gap: 10 (ref 5–15)
BUN: 6 mg/dL (ref 6–20)
CO2: 17 mmol/L — ABNORMAL LOW (ref 22–32)
Calcium: 8.8 mg/dL — ABNORMAL LOW (ref 8.9–10.3)
Chloride: 112 mmol/L — ABNORMAL HIGH (ref 98–111)
Creatinine, Ser: 0.61 mg/dL (ref 0.61–1.24)
GFR, Estimated: >60 mL/min (ref >60)
Glucose, Bld: 82 mg/dL (ref 70–99)
Potassium: 3.6 mmol/L (ref 3.5–5.1)
Sodium: 139 mmol/L (ref 135–145)

## 2020-03-10 LAB — TROPONIN I (HIGH SENSITIVITY)
Troponin I (High Sensitivity): 3 ng/L (ref <18)
Troponin I (High Sensitivity): 4 ng/L (ref <18)

## 2020-03-10 MED ORDER — CHLORHEXIDINE GLUCONATE 0.12 % MT SOLN: 15.0000 mL | Freq: Two times a day (BID) | OROMUCOSAL | 0 refills | Status: DC

## 2020-03-10 MED ORDER — OXYCODONE HCL 5 MG PO TABS
5.0000 mg | ORAL_TABLET | Freq: Once | ORAL | Status: AC
  Administered 2020-03-10: 5 mg via ORAL
  Filled 2020-03-10: qty 1

## 2020-03-10 MED ORDER — LIDOCAINE VISCOUS HCL 2 % MT SOLN
15.0000 mL | Freq: Once | OROMUCOSAL | Status: AC
  Administered 2020-03-10: 15 mL via OROMUCOSAL
  Filled 2020-03-10: qty 15

## 2020-03-10 MED ORDER — LIDOCAINE VISCOUS HCL 2 % MT SOLN: 15.0000 mL | OROMUCOSAL | 0 refills | Status: DC | PRN
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
## 2020-03-10 NOTE — Discharge Instructions (Addendum)
You were seen in the emergency department today for continued jaw/dental pain as well as chest pain as well as bleeding from your ear.  Your work-up was overall reassuring.  Your labs did show that your bicarb was a bit low, please have this rechecked by your primary care provider.  Your chest x-ray was normal.  Your EKG looks similar to prior EKGs you have had done.  We are sending him with a prescription for viscous lidocaine to use every 4 hours as needed for pain.  Please continue to take Tylenol per over-the-counter dosing to help with discomfort.  We are also sending you home with Peridex solution which is prevent infection.  Please use this twice per day.  We have prescribed you new medication(s) today. Discuss the medications prescribed today with your pharmacist as they can have adverse effects and interactions with your other medicines including over the counter and prescribed medications. Seek medical evaluation if you start to experience new or abnormal symptoms after taking one of these medicines, seek care immediately if you start to experience difficulty breathing, feeling of your throat closing, facial swelling, or rash as these could be indications of a more serious allergic reaction  Please follow-up with the [place] [facility] clinic, they have been in contact with your mother today, please be sure to schedule a close follow-up appointment.  We have also provided our social work transportation phone number to help you get to this appointment.  Return to the ER for new or worsening symptoms including but not limited to fever, chills, trouble moving your neck, pus draining from your mouth, persistent/worsening chest pain, or any other concerns.   [place] [facility]:  [facility] [address] [place], [place] [address] Dentistry [phone]
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
## 2020-03-10 NOTE — ED Triage Notes (Signed)
Patient presents with multiple complaints: Mid chest pain onset Thursday last week , right ear pain with bleeding , upper teeth and jaw pain , respirations unlabored , patient added headache .
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
## 2020-03-11 NOTE — Telephone Encounter (Signed)
Will forward to Dr, [name]

## 2020-03-11 NOTE — Telephone Encounter (Signed)
Will forward to pcp
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
## 2020-03-13 ENCOUNTER — Encounter: Payer: Self-pay | Admitting: Gastroenterology

## 2020-03-27 ENCOUNTER — Encounter: Payer: Self-pay | Admitting: Family Medicine

## 2020-03-27 ENCOUNTER — Ambulatory Visit: Payer: Self-pay | Attending: Internal Medicine | Admitting: Family Medicine

## 2020-03-27 ENCOUNTER — Other Ambulatory Visit: Payer: Self-pay

## 2020-03-27 VITALS — BP 117/81 | HR 82 | Temp 98.1°F | Ht 68.0 in | Wt 115.8 lb

## 2020-03-27 DIAGNOSIS — F1910 Other psychoactive substance abuse, uncomplicated: Secondary | ICD-10-CM

## 2020-03-27 DIAGNOSIS — F32A Depression, unspecified: Secondary | ICD-10-CM

## 2020-03-27 DIAGNOSIS — F419 Anxiety disorder, unspecified: Secondary | ICD-10-CM

## 2020-03-27 DIAGNOSIS — I10 Essential (primary) hypertension: Secondary | ICD-10-CM

## 2020-03-27 DIAGNOSIS — S0993XD Unspecified injury of face, subsequent encounter: Secondary | ICD-10-CM

## 2020-03-27 MED ORDER — SERTRALINE HCL 100 MG PO TABS: 100.0000 mg | ORAL_TABLET | Freq: Every day | ORAL | 3 refills | Status: DC

## 2020-03-27 MED ORDER — METOPROLOL TARTRATE 100 MG PO TABS: 100.0000 mg | ORAL_TABLET | Freq: Two times a day (BID) | ORAL | 3 refills | Status: DC

## 2020-03-27 MED ORDER — HYDROXYZINE PAMOATE 25 MG PO CAPS: 25.0000 mg | ORAL_CAPSULE | Freq: Three times a day (TID) | ORAL | 3 refills | Status: DC | PRN

## 2020-03-27 MED ORDER — QUETIAPINE FUMARATE 50 MG PO TABS: 50.0000 mg | ORAL_TABLET | Freq: Every day | ORAL | 3 refills | Status: DC

## 2020-03-27 MED ORDER — HYDRALAZINE HCL 25 MG PO TABS: 25.0000 mg | ORAL_TABLET | Freq: Three times a day (TID) | ORAL | 3 refills | Status: DC

## 2020-03-27 MED ORDER — LIDOCAINE VISCOUS HCL 2 % MT SOLN: 15.0000 mL | OROMUCOSAL | 0 refills | Status: DC | PRN

## 2020-03-27 MED ORDER — ACETAMINOPHEN-CODEINE #3 300-30 MG PO TABS
1.0000 | ORAL_TABLET | Freq: Three times a day (TID) | ORAL | 0 refills | Status: DC | PRN
Start: 2020-03-27 — End: 2020-03-27

## 2020-03-27 MED FILL — METOPROLOL TARTRATE 100 MG: 100 | 30 days supply | Qty: 60 | Fill #0

## 2020-03-27 MED FILL — HYDROXYZINE PAM 25 MG CAP: 25 | 30 days supply | Qty: 90 | Fill #0

## 2020-03-27 MED FILL — hydrALAZINE HCL 25 MG TABS: 25 | 30 days supply | Qty: 90 | Fill #0

## 2020-03-27 MED FILL — QUETIAPINE FUMARATE 50 MG T: 50 | 30 days supply | Qty: 30 | Fill #0

## 2020-03-27 MED FILL — SERTRALINE HCL 100 MG TAB: 100 | 30 days supply | Qty: 30 | Fill #0

## 2020-03-27 NOTE — Progress Notes (Signed)
Subjective:  Patient ID: [name], male    DOB: [birth year]  Age: 49 y.o. MRN: [id number]  CC: Hypertension   HPI [name] is a 49 year old male with history of alcohol induced chronic pancreatitis, major depressive disorder, polysubstance abuse, seizures He fell 3 weeks ago and hurt his jaw and teeth Seen by [facility] oral and maxillofacial surgery on 03/11/2020 where he underwent 4 teeth extraction with additional extraction scheduled for next wek. He has had associated pain in his mouth, headaches. He was given Oxycodone by his Dentist but has 1 pill left.  His chronic pancreatitis is followed by GI. He is on Seroquel, Zoloft and hydroxyzine for his anxiety and depression.  Request Ativan 'because he received that while at the [facility]'. Client with his antihypertensive.  Past Medical History:  Diagnosis Date  . Alcoholism /alcohol abuse   . Anemia   . Anxiety   . Arthritis    "knees; arms; elbows" (03/26/2015)  . Asthma   . Bipolar disorder ([place])   . Chronic bronchitis ([place])   . Chronic lower back pain   . Chronic pancreatitis ([place])   . Cocaine abuse ([place])   . Depression   . Family history of adverse reaction to anesthesia    "grandmother gets confused"  . Femoral condyle fracture ([place]) 03/08/2014   left medial/notes 03/09/2014  . GERD (gastroesophageal reflux disease)   . H/O hiatal hernia   . H/O suicide attempt 10/2012  . High cholesterol   . History of blood transfusion 10/2012   "when I tried to commit suicide"  . History of stomach ulcers   . Hypertension   . Marijuana abuse, continuous   . Migraine    "a few times/year" (03/26/2015)  . Pneumonia 1990's X 3  . PTSD (post-traumatic stress disorder)   . Sickle cell trait ([place])   . WPW (Wolff-Parkinson-White syndrome)    [name] 03/06/2013    Past Surgical History:  Procedure Laterality Date  . BIOPSY  11/25/2017   Procedure: BIOPSY;  Surgeon: Arta Silence, MD;  Location: [facility]  ENDOSCOPY;  Service: Endoscopy;;  . BIOPSY  10/14/2018   Procedure: BIOPSY;  Surgeon: Arta Silence, MD;  Location: [place];  Service: Endoscopy;;  . CARDIAC CATHETERIZATION    . CYST ENTEROSTOMY  01/02/2020   Procedure: CYST ASPIRATION;  Surgeon: Milus Banister, MD;  Location: [facility] ENDOSCOPY;  Service: Endoscopy;;  . ESOPHAGOGASTRODUODENOSCOPY (EGD) WITH PROPOFOL N/A 11/25/2017   Procedure: ESOPHAGOGASTRODUODENOSCOPY (EGD) WITH PROPOFOL;  Surgeon: Arta Silence, MD;  Location: [place];  Service: Endoscopy;  Laterality: N/A;  . ESOPHAGOGASTRODUODENOSCOPY (EGD) WITH PROPOFOL Left 10/14/2018   Procedure: ESOPHAGOGASTRODUODENOSCOPY (EGD) WITH PROPOFOL;  Surgeon: Arta Silence, MD;  Location: [facility] ENDOSCOPY;  Service: Endoscopy;  Laterality: Left;  . ESOPHAGOGASTRODUODENOSCOPY (EGD) WITH PROPOFOL N/A 11/14/2018   Procedure: ESOPHAGOGASTRODUODENOSCOPY (EGD) WITH PROPOFOL;  Surgeon: Laurence Spates, MD;  Location: [facility] ENDOSCOPY;  Service: Gastroenterology;  Laterality: N/A;  . ESOPHAGOGASTRODUODENOSCOPY (EGD) WITH PROPOFOL N/A 01/02/2020   Procedure: ESOPHAGOGASTRODUODENOSCOPY (EGD) WITH PROPOFOL;  Surgeon: Milus Banister, MD;  Location: [facility] ENDOSCOPY;  Service: Endoscopy;  Laterality: N/A;  . EUS N/A 01/02/2020   Procedure: UPPER ENDOSCOPIC ULTRASOUND (EUS) RADIAL;  Surgeon: Milus Banister, MD;  Location: [facility] ENDOSCOPY;  Service: Endoscopy;  Laterality: N/A;  . EYE SURGERY Left 1990's   "result of trauma"   . FACIAL FRACTURE SURGERY Left 1990's   "result of trauma"   . FRACTURE SURGERY    . HERNIA REPAIR    .  LEFT HEART CATHETERIZATION WITH CORONARY ANGIOGRAM Right 03/07/2013   Procedure: LEFT HEART CATHETERIZATION WITH CORONARY ANGIOGRAM;  Surgeon: [name], MD;  Location: [place] [facility] CATH LAB;  Service: Cardiovascular;  Laterality: Right;  . UMBILICAL HERNIA REPAIR    . UPPER GASTROINTESTINAL ENDOSCOPY      Family History  Problem Relation Age of Onset  . Hypertension Mother   .  Cirrhosis Mother   . Alcoholism Mother   . Hypertension Father   . Melanoma Father   . Hypertension Other   . Coronary artery disease Other     Allergies  Allergen Reactions  . Robaxin [Methocarbamol] Other (See Comments)    "jumpy limbs"  . Shellfish-Derived Products Nausea And Vomiting  . Trazodone And Nefazodone Other (See Comments)    Muscle spasms  . Adhesive [Tape] Itching  . Latex Itching  . Toradol [Ketorolac Tromethamine] Other (See Comments)    Has ulcers; cannot have this  . Contrast Media [Iodinated Diagnostic Agents] Hives  . Reglan [Metoclopramide] Other (See Comments)    Muscle spasms    Outpatient Medications Prior to Visit  Medication Sig Dispense Refill  . acetaminophen (TYLENOL) 500 MG tablet Take 2 tablets (1,000 mg total) by mouth every 8 (eight) hours as needed for mild pain. 30 tablet 0  . albuterol (VENTOLIN HFA) 108 (90 Base) MCG/ACT inhaler Inhale 2 puffs into the lungs every 4 (four) hours as needed for wheezing or shortness of breath. 18 g 11  . chlorhexidine (PERIDEX) 0.12 % solution Use as directed 15 mLs in the mouth or throat 2 (two) times daily. 120 mL 0  . Cyanocobalamin (VITAMIN B-12 PO) Take 1 tablet by mouth daily.    . cyclobenzaprine (FLEXERIL) 10 MG tablet Take 1 tablet (10 mg total) by mouth 2 (two) times daily as needed for muscle spasms. 20 tablet 0  . famotidine (PEPCID) 20 MG tablet Take 1 tablet (20 mg total) by mouth 2 (two) times daily. 30 tablet 0  . fluticasone furoate-vilanterol (BREO ELLIPTA) 200-25 MCG/INH AEPB Inhale 1 puff into the lungs daily. 1 each 11  . folic acid (FOLVITE) 1 MG tablet Take 1 tablet (1 mg total) by mouth daily. 30 tablet 0  . lipase/protease/amylase (CREON) 36000 UNITS CPEP capsule Take 2 capsules (72,000 Units total) by mouth 3 (three) times daily with meals. For pancreatitis (Patient taking differently: Take 72,000 Units by mouth 3 (three) times daily with meals. ) 180 capsule 5  . loperamide (IMODIUM  A-D) 2 MG tablet Take 2 mg by mouth 4 (four) times daily as needed for diarrhea or loose stools.     . Multiple Vitamin (MULTIVITAMIN WITH MINERALS) TABS tablet Take 1 tablet by mouth daily. 30 tablet 0  . NASONEX 50 MCG/ACT nasal spray Place 2 sprays into the nose daily as needed (allergies).     . potassium chloride SA (K-DUR) 20 MEQ tablet Take 1 tablet (20 mEq total) by mouth daily. (Patient taking differently: Take 20 mEq by mouth daily as needed (potassium). ) 3 tablet 0  . sucralfate (CARAFATE) 1 g tablet Take 1 tablet (1 g total) by mouth 4 (four) times daily -  with meals and at bedtime. 120 tablet 3  . thiamine 100 MG tablet Take 1 tablet (100 mg total) by mouth daily. 30 tablet 0  . hydrALAZINE (APRESOLINE) 25 MG tablet Take 1 tablet (25 mg total) by mouth 3 (three) times daily. (Patient taking differently: Take 25 mg by mouth 2 (two) times daily. ) 90  tablet 4  . hydrOXYzine (VISTARIL) 25 MG capsule Take 1 capsule (25 mg total) by mouth 3 (three) times daily as needed for anxiety. 90 capsule 3  . LORazepam (ATIVAN) 0.5 MG tablet Take 0.5 mg by mouth every 8 (eight) hours as needed for anxiety.     . metoprolol tartrate (LOPRESSOR) 100 MG tablet Take 1 tablet (100 mg total) by mouth 2 (two) times daily. To lower blood pressure (Patient taking differently: Take 100 mg by mouth 2 (two) times daily. ) 60 tablet 2  . QUEtiapine (SEROQUEL) 50 MG tablet Take 1 tablet (50 mg total) by mouth at bedtime. 30 tablet 0  . sertraline (ZOLOFT) 100 MG tablet Take 1 tablet (100 mg total) by mouth daily. 30 tablet 3  . traMADol (ULTRAM) 50 MG tablet Take 1 tablet (50 mg total) by mouth every 8 (eight) hours as needed for severe pain. 10 tablet 0  . dexlansoprazole (DEXILANT) 60 MG capsule Take 1 capsule (60 mg total) by mouth daily. (Patient not taking: Reported on 03/27/2020) 90 capsule 3  . HYDROcodone-acetaminophen (NORCO/VICODIN) 5-325 MG tablet Take 1-2 tablets by mouth every 6 (six) hours as needed.  (Patient not taking: Reported on 03/27/2020) 6 tablet 0  . lidocaine (XYLOCAINE) 2 % solution Use as directed 15 mLs in the mouth or throat every 4 (four) hours as needed for mouth pain. (Patient not taking: Reported on 03/27/2020) 100 mL 0   No facility-administered medications prior to visit.     ROS Review of Systems  Constitutional: Negative for activity change and appetite change.  HENT: Negative for sinus pressure and sore throat.   Eyes: Negative for visual disturbance.  Respiratory: Negative for cough, chest tightness and shortness of breath.   Cardiovascular: Negative for chest pain and leg swelling.  Gastrointestinal: Negative for abdominal distention, abdominal pain, constipation and diarrhea.  Endocrine: Negative.   Genitourinary: Negative for dysuria.  Musculoskeletal: Negative for joint swelling and myalgias.  Skin: Negative for rash.  Allergic/Immunologic: Negative.   Neurological: Negative for weakness, light-headedness and numbness.  Psychiatric/Behavioral: Negative for dysphoric mood and suicidal ideas.    Objective:  BP 117/81   Pulse 82   Temp 98.1 F (36.7 C) (Oral)   Ht 5\' 8"  (1.727 m)   Wt 115 lb 12.8 oz (52.5 kg)   SpO2 99%   BMI 17.61 kg/m   BP/Weight 03/27/2020 03/10/2020 21/30/8657  Systolic BP 846 962 952  Diastolic BP 81 69 96  Wt. (Lbs) 115.8 143.3 130.07  BMI 17.61 21.79 19.78  Some encounter information is confidential and restricted. Go to Review Flowsheets activity to see all data.      Physical Exam Constitutional:      Appearance: He is well-developed.  Neck:     Vascular: No JVD.  Cardiovascular:     Rate and Rhythm: Normal rate.     Heart sounds: Normal heart sounds. No murmur heard.   Pulmonary:     Effort: Pulmonary effort is normal.     Breath sounds: Normal breath sounds. No wheezing or rales.  Chest:     Chest wall: No tenderness.  Abdominal:     General: Bowel sounds are normal. There is no distension.      Palpations: Abdomen is soft. There is no mass.     Tenderness: There is no abdominal tenderness.  Musculoskeletal:        General: Normal range of motion.     Right lower leg: No edema.  Left lower leg: No edema.  Neurological:     Mental Status: He is alert and oriented to person, place, and time.  Psychiatric:        Mood and Affect: Mood normal.     CMP Latest Ref Rng & Units 03/10/2020 03/05/2020 03/05/2020  Glucose 70 - 99 mg/dL 82 80 75  BUN 6 - 20 mg/dL 6 7 8   Creatinine 0.61 - 1.24 mg/dL 0.61 0.50(L) 0.62  Sodium 135 - 145 mmol/L 139 136 137  Potassium 3.5 - 5.1 mmol/L 3.6 4.1 6.0(H)  Chloride 98 - 111 mmol/L 112(H) 109 107  CO2 22 - 32 mmol/L 17(L) - 21(L)  Calcium 8.9 - 10.3 mg/dL 8.8(L) - 7.9(L)  Total Protein 6.5 - 8.1 g/dL - - 6.0(L)  Total Bilirubin 0.3 - 1.2 mg/dL - - 1.1  Alkaline Phos 38 - 126 U/L - - 74  AST 15 - 41 U/L - - 81(H)  ALT 0 - 44 U/L - - 58(H)    Lipid Panel     Component Value Date/Time   CHOL 176 11/13/2018 0634   TRIG 134 11/13/2018 0634   HDL 77 11/13/2018 0634   CHOLHDL 2.3 11/13/2018 0634   VLDL 27 11/13/2018 0634   LDLCALC 72 11/13/2018 0634    CBC    Component Value Date/Time   WBC 6.6 03/10/2020 1927   RBC 3.53 (L) 03/10/2020 1927   HGB 11.6 (L) 03/10/2020 1927   HCT 36.0 (L) 03/10/2020 1927   PLT 200 03/10/2020 1927   MCV 102.0 (H) 03/10/2020 1927   MCH 32.9 03/10/2020 1927   MCHC 32.2 03/10/2020 1927   RDW 13.5 03/10/2020 1927   LYMPHSABS 2.0 03/05/2020 1103   MONOABS 0.8 03/05/2020 1103   EOSABS 0.0 03/05/2020 1103   BASOSABS 0.0 03/05/2020 1103    Lab Results  Component Value Date   HGBA1C 4.6 (L) 11/13/2018    Assessment & Plan:  1. Anxiety and depression Stable Advised that unfortunately I will be unable to prescribe Ativan as it is not recommended for long-term management of anxiety due to habit-forming nature and dependence He does have history of underlying substance abuse I have offered to refer him  to psychiatry and refill his chronic medications - sertraline (ZOLOFT) 100 MG tablet; Take 1 tablet (100 mg total) by mouth daily.  Dispense: 30 tablet; Refill: 3 - QUEtiapine (SEROQUEL) 50 MG tablet; Take 1 tablet (50 mg total) by mouth at bedtime.  Dispense: 30 tablet; Refill: 3 - Ambulatory referral to Psychiatry - hydrOXYzine (VISTARIL) 25 MG capsule; Take 1 capsule (25 mg total) by mouth 3 (three) times daily as needed for anxiety.  Dispense: 90 capsule; Refill: 3  2. Essential hypertension Controlled Counseled on blood pressure goal of less than 130/80, low-sodium, DASH diet, medication compliance, 150 minutes of moderate intensity exercise per week. Discussed medication compliance, adverse effects. - metoprolol tartrate (LOPRESSOR) 100 MG tablet; Take 1 tablet (100 mg total) by mouth 2 (two) times daily.  Dispense: 60 tablet; Refill: 3 - hydrALAZINE (APRESOLINE) 25 MG tablet; Take 1 tablet (25 mg total) by mouth 3 (three) times daily.  Dispense: 90 tablet; Refill: 3  3. Polysubstance abuse ([place]) Ordered a drug screen given his request for opioid analgesics however he declined urine drug screen stating he was unable to provide sample. - Drug Screen, Urine  4. Dental trauma, subsequent encounter I initially wrote a prescription for Tylenol 3 for him until his dental appointment on Monday however due to his  inability to provide urine specimen, I have informed him that this prescription will be canceled. He will need to call his oral surgeon for refill of his opioid analgesic. - lidocaine (XYLOCAINE) 2 % solution; Use as directed 15 mLs in the mouth or throat every 4 (four) hours as needed for mouth pain.  Dispense: 100 mL; Refill: 0    Meds ordered this encounter  Medications  . sertraline (ZOLOFT) 100 MG tablet    Sig: Take 1 tablet (100 mg total) by mouth daily.    Dispense:  30 tablet    Refill:  3  . QUEtiapine (SEROQUEL) 50 MG tablet    Sig: Take 1 tablet (50 mg total) by  mouth at bedtime.    Dispense:  30 tablet    Refill:  3  . metoprolol tartrate (LOPRESSOR) 100 MG tablet    Sig: Take 1 tablet (100 mg total) by mouth 2 (two) times daily.    Dispense:  60 tablet    Refill:  3  . hydrALAZINE (APRESOLINE) 25 MG tablet    Sig: Take 1 tablet (25 mg total) by mouth 3 (three) times daily.    Dispense:  90 tablet    Refill:  3    To lower blood pressure  . hydrOXYzine (VISTARIL) 25 MG capsule    Sig: Take 1 capsule (25 mg total) by mouth 3 (three) times daily as needed for anxiety.    Dispense:  90 capsule    Refill:  3  . lidocaine (XYLOCAINE) 2 % solution    Sig: Use as directed 15 mLs in the mouth or throat every 4 (four) hours as needed for mouth pain.    Dispense:  100 mL    Refill:  0  . DISCONTD: acetaminophen-codeine (TYLENOL #3) 300-30 MG tablet    Sig: Take 1 tablet by mouth every 8 (eight) hours as needed for moderate pain.    Dispense:  10 tablet    Refill:  0    Follow-up: Return in about 3 months (around 06/27/2020) for Chronic disease management.       [name], MD, FAAFP. [facility], [place]   03/27/2020, 2:35 PM

## 2020-03-27 NOTE — Progress Notes (Signed)
Pt is having pain in mouth, head and ears, had a fall 3 weeks ago.

## 2020-03-29 ENCOUNTER — Emergency Department (HOSPITAL_COMMUNITY)
Admission: EM | Admit: 2020-03-29 | Discharge: 2020-03-29 | Disposition: A | Discharge status: Home / Self Care | Payer: Medicaid Other | Attending: Emergency Medicine | Admitting: Emergency Medicine

## 2020-03-29 ENCOUNTER — Emergency Department (HOSPITAL_COMMUNITY): Payer: Medicaid Other

## 2020-03-29 DIAGNOSIS — I10 Essential (primary) hypertension: Secondary | ICD-10-CM | POA: Insufficient documentation

## 2020-03-29 DIAGNOSIS — R6884 Jaw pain: Secondary | ICD-10-CM

## 2020-03-29 DIAGNOSIS — J45909 Unspecified asthma, uncomplicated: Secondary | ICD-10-CM | POA: Insufficient documentation

## 2020-03-29 DIAGNOSIS — R079 Chest pain, unspecified: Secondary | ICD-10-CM

## 2020-03-29 DIAGNOSIS — F17290 Nicotine dependence, other tobacco product, uncomplicated: Secondary | ICD-10-CM | POA: Insufficient documentation

## 2020-03-29 DIAGNOSIS — F17210 Nicotine dependence, cigarettes, uncomplicated: Secondary | ICD-10-CM | POA: Diagnosis not present

## 2020-03-29 DIAGNOSIS — R0789 Other chest pain: Secondary | ICD-10-CM | POA: Insufficient documentation

## 2020-03-29 DIAGNOSIS — Z79899 Other long term (current) drug therapy: Secondary | ICD-10-CM | POA: Diagnosis not present

## 2020-03-29 DIAGNOSIS — Z91040 Latex allergy status: Secondary | ICD-10-CM | POA: Insufficient documentation

## 2020-03-29 LAB — BASIC METABOLIC PANEL
Anion gap: 9 (ref 5–15)
BUN: 6 mg/dL (ref 6–20)
CO2: 20 mmol/L — ABNORMAL LOW (ref 22–32)
Calcium: 8.2 mg/dL — ABNORMAL LOW (ref 8.9–10.3)
Chloride: 108 mmol/L (ref 98–111)
Creatinine, Ser: 0.65 mg/dL (ref 0.61–1.24)
GFR, Estimated: >60 mL/min (ref >60)
Glucose, Bld: 92 mg/dL (ref 70–99)
Potassium: 3.9 mmol/L (ref 3.5–5.1)
Sodium: 137 mmol/L (ref 135–145)

## 2020-03-29 LAB — CBC
HCT: 36.6 % — ABNORMAL LOW (ref 39.0–52.0)
Hemoglobin: 11.6 g/dL — ABNORMAL LOW (ref 13.0–17.0)
MCH: 32.5 pg (ref 26.0–34.0)
MCHC: 31.7 g/dL (ref 30.0–36.0)
MCV: 102.5 fL — ABNORMAL HIGH (ref 80.0–100.0)
Platelets: 239 10*3/uL (ref 150–400)
RBC: 3.57 MIL/uL — ABNORMAL LOW (ref 4.22–5.81)
RDW: 14.3 % (ref 11.5–15.5)
WBC: 6.7 10*3/uL (ref 4.0–10.5)
nRBC: 0 % (ref 0.0–0.2)

## 2020-03-29 LAB — TROPONIN I (HIGH SENSITIVITY)
Troponin I (High Sensitivity): 3 ng/L (ref <18)
Troponin I (High Sensitivity): 4 ng/L (ref <18)

## 2020-03-29 LAB — CBG MONITORING, ED: Glucose-Capillary: 63 mg/dL — ABNORMAL LOW (ref 70–99)

## 2020-03-29 MED ORDER — LIDOCAINE VISCOUS HCL 2 % MT SOLN
15.0000 mL | Freq: Once | OROMUCOSAL | Status: AC
  Administered 2020-03-29: 15 mL via ORAL
  Filled 2020-03-29: qty 15

## 2020-03-29 MED ORDER — ALUM & MAG HYDROXIDE-SIMETH 200-200-20 MG/5ML PO SUSP
30.0000 mL | Freq: Once | ORAL | Status: AC
  Administered 2020-03-29: 30 mL via ORAL
  Filled 2020-03-29: qty 30

## 2020-03-29 MED ORDER — CELECOXIB 200 MG PO CAPS: 200.0000 mg | ORAL_CAPSULE | Freq: Two times a day (BID) | ORAL | 0 refills | Status: DC

## 2020-03-29 MED ORDER — ACETAMINOPHEN 325 MG PO TABS
650.0000 mg | ORAL_TABLET | Freq: Once | ORAL | Status: AC
  Administered 2020-03-29: 650 mg via ORAL
  Filled 2020-03-29: qty 2

## 2020-03-29 NOTE — ED Triage Notes (Signed)
Pt arrived by EMS complaining of chest pain and jaw pain.  Pt states he started to have sharp chest pain and got dizzy, not states he was able to lower himself to ground but did hit his jaw on the ground.   Hx of WPW and a jaw fracture earlier in the month due to fall   On arrival pt ambulatory to restroom and A&Ox4.   Medics gave 50 mcg of Fentanyl with relief pain is now a 2/10

## 2020-03-29 NOTE — ED Notes (Signed)
Pt transported to xray 

## 2020-03-29 NOTE — Care Management (Signed)
Primary care MD assigned to [facility]- patient needs to call to establish on Monday.  Number and information in patient instructions.

## 2020-03-29 NOTE — ED Provider Notes (Signed)
[facility] EMERGENCY DEPARTMENT Provider Note   CSN: [id number] Arrival date & time: 03/29/20  0744     History Chief Complaint  Patient presents with  . Chest Pain  . Jaw Pain    [name] is a 49 y.o. male who is well-known to this emergency department with a past medical history of alcoholism, bipolar disorder, chronic pain, WPW, cocaine abuse, chronic pancreatitis, homelessness.  He presents for evaluation of presyncope and chest pain.  On 03/05/2020 patient had a syncopal event broke 2 of his teeth.  He has had few visits since that time requesting refills on oxycodone.  Patient states that today he began feeling lightheaded and then began having pain in his chest which he describes as sharp.  He was given fentanyl by EMS prior to arrival due to his intolerance of aspirin and recent use of ED medication.  He states that he fell and hit his jaw again but did not knock any of his teeth out.  He is worried he may have broken something in his teeth again.  He denies full loss of consciousness.  HPI     Past Medical History:  Diagnosis Date  . Alcoholism /alcohol abuse   . Anemia   . Anxiety   . Arthritis    "knees; arms; elbows" (03/26/2015)  . Asthma   . Bipolar disorder ([place])   . Chronic bronchitis ([place])   . Chronic lower back pain   . Chronic pancreatitis ([place])   . Cocaine abuse ([place])   . Depression   . Family history of adverse reaction to anesthesia    "grandmother gets confused"  . Femoral condyle fracture ([place]) 03/08/2014   left medial/notes 03/09/2014  . GERD (gastroesophageal reflux disease)   . H/O hiatal hernia   . H/O suicide attempt 10/2012  . High cholesterol   . History of blood transfusion 10/2012   "when I tried to commit suicide"  . History of stomach ulcers   . Hypertension   . Marijuana abuse, continuous   . Migraine    "a few times/year" (03/26/2015)  . Pneumonia 1990's X 3  . PTSD (post-traumatic stress disorder)    . Sickle cell trait ([place])   . WPW (Wolff-Parkinson-White syndrome)    [name] 03/06/2013    Patient Active Problem List   Diagnosis Date Noted  . AKI (acute kidney injury) ([place]) 11/13/2018  . Seizure ([place]) 11/13/2018  . Gastritis and gastroduodenitis   . Chest pain 01/08/2018  . GI bleed 11/24/2017  . Acute blood loss anemia 11/24/2017  . Atypical chest pain 11/24/2017  . Acute pancreatitis 09/28/2017  . Abdominal pain 05/27/2017  . Hematemesis 05/27/2017  . Tachycardia 03/18/2017  . Diarrhea 03/18/2017  . Acute on chronic pancreatitis ([place]) 12/17/2016  . Intractable nausea and vomiting 12/05/2016  . Verbally abusive behavior 12/05/2016  . Normocytic anemia 12/05/2016  . Alcohol use disorder, severe, dependence ([place]) 07/25/2016  . Cocaine use disorder, severe, dependence ([place]) 07/25/2016  . 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 Leukocytosis   . Hospital acquired PNA 05/20/2015  . Chronic pancreatitis ([place]) 05/18/2015  . Pseudocyst of pancreas 05/18/2015  . Polysubstance abuse (tobacco, cocaine, THC, and ETOH) 03/26/2015  . Alcohol-induced chronic pancreatitis ([place])   . Essential hypertension 02/06/2014  . Mood disorder ([place]) 02/06/2014  . Alcohol-induced acute pancreatitis 11/28/2013  . Pancreatic pseudocyst/cyst 11/25/2013  . Severe protein-calorie malnutrition ([place]) 10/10/2013  . Suicide attempt ([place]) 10/08/2013  .  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 TOBACCO ABUSE 03/23/2007    Past Surgical History:  Procedure Laterality Date  . BIOPSY  11/25/2017   Procedure: BIOPSY;  Surgeon: Arta Silence, MD;  Location: [facility] ENDOSCOPY;  Service: Endoscopy;;  . BIOPSY  10/14/2018   Procedure: BIOPSY;  Surgeon: Arta Silence, MD;  Location: [facility];  Service: Endoscopy;;  . CARDIAC CATHETERIZATION    . CYST ENTEROSTOMY  01/02/2020   Procedure: CYST ASPIRATION;  Surgeon: Milus Banister,  MD;  Location: [facility] ENDOSCOPY;  Service: Endoscopy;;  . ESOPHAGOGASTRODUODENOSCOPY (EGD) WITH PROPOFOL N/A 11/25/2017   Procedure: ESOPHAGOGASTRODUODENOSCOPY (EGD) WITH PROPOFOL;  Surgeon: Arta Silence, MD;  Location: [facility];  Service: Endoscopy;  Laterality: N/A;  . ESOPHAGOGASTRODUODENOSCOPY (EGD) WITH PROPOFOL Left 10/14/2018   Procedure: ESOPHAGOGASTRODUODENOSCOPY (EGD) WITH PROPOFOL;  Surgeon: Arta Silence, MD;  Location[facility] ENDOSCOPY;  Service: Endoscopy;  Laterality: Left;  . ESOPHAGOGASTRODUODENOSCOPY (EGD) WITH PROPOFOL N/A 11/14/2018   Procedure: ESOPHAGOGASTRODUODENOSCOPY (EGD) WITH PROPOFOL;  Surgeon: Laurence Spates, MD;  Location: [facility] ENDOSCOPY;  Service: Gastroenterology;  Laterality: N/A;  . ESOPHAGOGASTRODUODENOSCOPY (EGD) WITH PROPOFOL N/A 01/02/2020   Procedure: ESOPHAGOGASTRODUODENOSCOPY (EGD) WITH PROPOFOL;  Surgeon: Milus Banister, MD;  Location: [facility] ENDOSCOPY;  Service: Endoscopy;  Laterality: N/A;  . EUS N/A 01/02/2020   Procedure: UPPER ENDOSCOPIC ULTRASOUND (EUS) RADIAL;  Surgeon: Milus Banister, MD;  Location: [facility] ENDOSCOPY;  Service: Endoscopy;  Laterality: N/A;  . EYE SURGERY Left 1990's   "result of trauma"   . FACIAL FRACTURE SURGERY Left 1990's   "result of trauma"   . FRACTURE SURGERY    . HERNIA REPAIR    . LEFT HEART CATHETERIZATION WITH CORONARY ANGIOGRAM Right 03/07/2013   Procedure: LEFT HEART CATHETERIZATION WITH CORONARY ANGIOGRAM;  Surgeon: [name], MD;  Location: [facility] CATH LAB;  Service: Cardiovascular;  Laterality: Right;  . UMBILICAL HERNIA REPAIR    . UPPER GASTROINTESTINAL ENDOSCOPY         Family History  Problem Relation Age of Onset  . Hypertension Mother   . Cirrhosis Mother   . Alcoholism Mother   . Hypertension Father   . Melanoma Father   . Hypertension Other   . Coronary artery disease Other     Social History   Tobacco Use  . Smoking status: Current Every Day Smoker    Packs/day: 1.00    Years: 36.00    Pack  years: 36.00    Types: Cigarettes, E-cigarettes  . Smokeless tobacco: Never Used  Vaping Use  . Vaping Use: Former  Substance Use Topics  . Alcohol use: Yes  . Drug use: Yes    Types: Marijuana, Cocaine    Comment: daily marijuana use; last cocaine use about 3 months ago    Home Medications Prior to Admission medications   Medication Sig Start Date End Date Taking? Authorizing Provider  acetaminophen (TYLENOL) 500 MG tablet Take 2 tablets (1,000 mg total) by mouth every 8 (eight) hours as needed for mild pain. 08/29/19   Elodia Florence., MD  albuterol (VENTOLIN HFA) 108 (90 Base) MCG/ACT inhaler Inhale 2 puffs into the lungs every 4 (four) hours as needed for wheezing or shortness of breath. 11/12/19   [name], DO  chlorhexidine (PERIDEX) 0.12 % solution Use as directed 15 mLs in the mouth or throat 2 (two) times daily. 03/10/20   Petrucelli, Samantha R, PA-C  Cyanocobalamin (VITAMIN B-12 PO) Take 1 tablet by mouth daily.    [provider]  cyclobenzaprine (FLEXERIL) 10 MG tablet Take 1 tablet (10 mg total) by mouth 2 (two) times daily as needed for muscle spasms. 03/03/19   Recardo Evangelist, PA-C  dexlansoprazole (DEXILANT) 60 MG capsule Take 1 capsule (60 mg total) by mouth daily. Patient not taking: Reported on 03/27/2020 11/13/19   Yetta Flock, MD  famotidine (PEPCID) 20 MG tablet Take 1 tablet (20 mg total) by mouth 2 (two) times daily. 08/08/19   Carlisle Cater, PA-C  fluticasone furoate-vilanterol (BREO ELLIPTA) 200-25 MCG/INH AEPB Inhale 1 puff into the lungs daily. 11/12/19   [name], DO  folic acid (FOLVITE) 1 MG tablet Take 1 tablet (1 mg total) by mouth daily. 11/26/17   [name], MD  hydrALAZINE (APRESOLINE) 25 MG tablet Take 1 tablet (25 mg total) by mouth 3 (three) times daily. 03/27/20   [name], MD  HYDROcodone-acetaminophen (NORCO/VICODIN) 5-325 MG tablet Take 1-2 tablets by mouth every 6 (six) hours as needed. Patient not  taking: Reported on 03/27/2020 03/05/20   [place] Lanius A, PA-C  hydrOXYzine (VISTARIL) 25 MG capsule Take 1 capsule (25 mg total) by mouth 3 (three) times daily as needed for anxiety. 03/27/20   [name], MD  lidocaine (XYLOCAINE) 2 % solution Use as directed 15 mLs in the mouth or throat every 4 (four) hours as needed for mouth pain. 03/27/20   [name], MD  lipase/protease/amylase (CREON) 36000 UNITS CPEP capsule Take 2 capsules (72,000 Units total) by mouth 3 (three) times daily with meals. For pancreatitis Patient taking differently: Take 72,000 Units by mouth 3 (three) times daily with meals.  11/05/19   [name], [name], MD  loperamide (IMODIUM A-D) 2 MG tablet Take 2 mg by mouth 4 (four) times daily as needed for diarrhea or loose stools.     [provider]  metoprolol tartrate (LOPRESSOR) 100 MG tablet Take 1 tablet (100 mg total) by mouth 2 (two) times daily. 03/27/20   [name], MD  Multiple Vitamin (MULTIVITAMIN WITH MINERALS) TABS tablet Take 1 tablet by mouth daily. 09/06/17   Bonnell Public, MD  NASONEX 50 MCG/ACT nasal spray Place 2 sprays into the nose daily as needed (allergies).  06/19/19   [provider]  potassium chloride SA (K-DUR) 20 MEQ tablet Take 1 tablet (20 mEq total) by mouth daily. Patient taking differently: Take 20 mEq by mouth daily as needed (potassium).  01/07/19   Sherwood Gambler, MD  QUEtiapine (SEROQUEL) 50 MG tablet Take 1 tablet (50 mg total) by mouth at bedtime. 03/27/20   [name], MD  sertraline (ZOLOFT) 100 MG tablet Take 1 tablet (100 mg total) by mouth daily. 03/27/20   [name], MD  sucralfate (CARAFATE) 1 g tablet Take 1 tablet (1 g total) by mouth 4 (four) times daily -  with meals and at bedtime. 11/05/19   [name], [name], MD  thiamine 100 MG tablet Take 1 tablet (100 mg total) by mouth daily. 10/16/18   [name], MD  amitriptyline (ELAVIL) 25 MG tablet Take 1 tablet (25 mg total)  by mouth at bedtime. Patient not taking: Reported on 08/08/2019 10/15/18 08/08/19  [name], MD  gabapentin (NEURONTIN) 100 MG capsule Take 1 capsule (100 mg total) by mouth 2 (two) times daily. Patient not taking: Reported on 08/08/2019 10/15/18 08/08/19  [name], MD  pantoprazole (PROTONIX) 40 MG tablet Take 1 tablet (40 mg total) by mouth 2 (two) times daily. Patient not taking:  Reported on 11/17/2019 11/05/19 03/10/20  Yetta Flock, MD  promethazine (PHENERGAN) 25 MG tablet Take 1 tablet (25 mg total) by mouth every 6 (six) hours as needed for nausea or vomiting. Patient not taking: Reported on 08/08/2019 03/02/19 08/08/19  Kinnie Feil, PA-C    Allergies    Robaxin [methocarbamol], Shellfish-derived products, Trazodone and nefazodone, Adhesive [tape], Latex, Toradol [ketorolac tromethamine], Contrast media [iodinated diagnostic agents], and Reglan [metoclopramide]  Review of Systems   Review of Systems  Physical Exam Updated Vital Signs BP 122/88   Pulse 66   Temp 97.9 F (36.6 C) (Oral)   Resp 11   SpO2 100%   Physical Exam Vitals and nursing note reviewed.  Constitutional:      General: He is not in acute distress.    Appearance: He is well-developed. He is not diaphoretic.  HENT:     Head: Normocephalic and atraumatic.     Jaw: There is normal jaw occlusion. Tenderness and pain on movement present. No trismus, swelling or malocclusion.     Mouth/Throat:     Mouth: Mucous membranes are moist.     Dentition: Dental caries present.  Eyes:     General: No scleral icterus.    Conjunctiva/sclera: Conjunctivae normal.  Cardiovascular:     Rate and Rhythm: Normal rate and regular rhythm.     Heart sounds: Normal heart sounds.  Pulmonary:     Effort: Pulmonary effort is normal. No respiratory distress.     Breath sounds: Normal breath sounds.  Abdominal:     Palpations: Abdomen is soft.     Tenderness: There is no abdominal tenderness.  Musculoskeletal:      Cervical back: Normal range of motion and neck supple.  Skin:    General: Skin is warm and dry.  Neurological:     Mental Status: He is alert.  Psychiatric:        Behavior: Behavior normal.     ED Results / Procedures / Treatments   Labs (all labs ordered are listed, but only abnormal results are displayed) Labs Reviewed  BASIC METABOLIC PANEL  CBC  CBG MONITORING, ED  TROPONIN I (HIGH SENSITIVITY)    EKG None  Radiology No results found.  Procedures Procedures (including critical care time)  Medications Ordered in ED Medications - No data to display  ED Course  I have reviewed the triage vital signs and the nursing notes.  Pertinent labs & imaging results that were available during my care of the patient were reviewed by me and considered in my medical decision making (see chart for details).    MDM Rules/Calculators/A&P                          Given the large differential diagnosis for [name], the decision making in this case is of high complexity.  After evaluating all of the data points in this case, the presentation of [name] is NOT consistent with Acute Coronary Syndrome (ACS) and/or myocardial ischemia, pulmonary embolism, aortic dissection; Borhaave's, significant arrythmia, pneumothorax, cardiac tamponade, or other emergent cardiopulmonary condition.  Further, the presentation of [name] is NOT consistent with pericarditis, myocarditis, cholecystitis, pancreatitis, mediastinitis, endocarditis, new valvular disease.  Additionally, the presentation of [name] NOT consistent with flail chest, cardiac contusion, ARDS, or significant intra-thoracic or intra-abdominal bleeding.  Moreover, this presentation is NOT consistent with pneumonia, sepsis, or pyelonephritis.  The patient has had a work-up here  in the emergency department consisting of labs which I ordered and evaluated.  The included CBC  which shows mild macrocytic anemia consistent with history of alcohol abuse, BMP without significant abnormality, glucose is 92. Troponin negative x2.  Heart score of 4. Portable one view chest x-ray shows no acute abnormalities, 4 view of the mandible also shows no acute abnormalities.  EKG shows normal sinus rhythm at a rate of 76 without acute ischemic changes.  Strict return and follow-up precautions have been given by me personally or by detailed written instruction given verbally by nursing staff using the teach back method to the patient/family/caregiver(s).  Data Reviewed/Counseling: I have reviewed the patient's vital signs, nursing notes, and other relevant tests/information. I had a detailed discussion regarding the historical points, exam findings, and any diagnostic results supporting the discharge diagnosis. I also discussed the need for outpatient follow-up and the need to return to the ED if symptoms worsen or if there are any questions or concerns that arise at home.  Final Clinical Impression(s) / ED Diagnoses Final diagnoses:  None    Rx / DC Orders ED Discharge Orders    None       Margarita Mail, PA-C 03/29/20 [place], MD 03/29/20 1247

## 2020-03-29 NOTE — ED Notes (Signed)
Pt returned from x-ray imaging States pain in jaw has increased since he had to move it for imaging, rates pain 7/10

## 2020-03-29 NOTE — ED Notes (Signed)
Pt provided a cup of apple juice for low blood sugar. Pt states he has not ate or drank anything since last night Denies hx of low blood glucose but states he does have chronic pancreatitis

## 2020-03-29 NOTE — ED Notes (Signed)
Pt c/o sudden increased in chest pain. Pain is on left side and described as 8/10 sharp pain Provider aware

## 2020-03-29 NOTE — Discharge Instructions (Addendum)
Your chest pain work-up is negative for an emergency today.  I am discharging you with a pain medication called Celebrex.  This is safe for you to take and should not affect her ulcers.  Please follow-up with a primary care physician for your chronic pain issues.  I have given your referral to the heart doctor for your [place] Parkinson's White and frequent episodes of feeling like you might pass out.   Your caregiver has diagnosed you as having chest pain that is not specific for one problem, but does not require admission.  You are at low risk for an acute heart condition or other serious illness. Chest pain comes from many different causes.  SEEK IMMEDIATE MEDICAL ATTENTION IF: You have severe chest pain, especially if the pain is crushing or pressure-like and spreads to the arms, back, neck, or jaw, or if you have sweating, nausea (feeling sick to your stomach), or shortness of breath. THIS IS AN EMERGENCY. Don't wait to see if the pain will go away. Get medical help at once. Call 911 or 0 (operator). DO NOT drive yourself to the hospital.  Your chest pain gets worse and does not go away with rest.  You have an attack of chest pain lasting longer than usual, despite rest and treatment with the medications your caregiver has prescribed.  You wake from sleep with chest pain or shortness of breath.  You feel dizzy or faint.  You have chest pain not typical of your usual pain for which you originally saw your caregiver.

## 2020-04-07 ENCOUNTER — Encounter: Payer: Self-pay | Admitting: Gastroenterology

## 2020-04-07 ENCOUNTER — Ambulatory Visit (INDEPENDENT_AMBULATORY_CARE_PROVIDER_SITE_OTHER): Payer: Self-pay | Admitting: Gastroenterology

## 2020-04-07 ENCOUNTER — Ambulatory Visit: Payer: Self-pay | Admitting: Gastroenterology

## 2020-04-07 VITALS — BP 110/78 | HR 80 | Ht 67.25 in | Wt 112.5 lb

## 2020-04-07 DIAGNOSIS — F1010 Alcohol abuse, uncomplicated: Secondary | ICD-10-CM

## 2020-04-07 DIAGNOSIS — R1013 Epigastric pain: Secondary | ICD-10-CM

## 2020-04-07 DIAGNOSIS — F17200 Nicotine dependence, unspecified, uncomplicated: Secondary | ICD-10-CM

## 2020-04-07 DIAGNOSIS — Z8719 Personal history of other diseases of the digestive system: Secondary | ICD-10-CM

## 2020-04-07 DIAGNOSIS — K860 Alcohol-induced chronic pancreatitis: Secondary | ICD-10-CM

## 2020-04-07 DIAGNOSIS — G8929 Other chronic pain: Secondary | ICD-10-CM

## 2020-04-07 DIAGNOSIS — K219 Gastro-esophageal reflux disease without esophagitis: Secondary | ICD-10-CM

## 2020-04-07 MED ORDER — OMEPRAZOLE 40 MG PO CPDR: DELAYED_RELEASE_CAPSULE | ORAL | 1 refills | Status: DC

## 2020-04-07 MED ORDER — HYDROCODONE-ACETAMINOPHEN 5-325 MG PO TABS: 1.0000 | ORAL_TABLET | Freq: Three times a day (TID) | ORAL | 0 refills | Status: DC | PRN

## 2020-04-07 NOTE — Progress Notes (Signed)
HPI :  49 year old male here for a follow-up visit for chronic pancreatitis, chronic abdominal pain.  History of peptic ulcer disease, history of alcohol and tobacco use.  See prior clinic notes for full details of his case.  He has had a history of pancreatitis in the past due to alcohol.  He endorses history of alcohol use anywhere from a sixpack of beer to more in a typical day, as well as history of tobacco use, half pack cigarettes per day.  He has had a pretty extensive evaluation since I met him a few months ago as outlined:  CT scan 08/08/19 - IMPRESSION: 1. Clear lung bases. 2. No evidence of bowel obstruction. 3. Scattered fluid levels in the small and large bowel, suggesting ileus / malabsorptive state. No bowel wall thickening. 4. Chronic pancreatitis. Marked beaded dilatation of the main pancreatic duct in the pancreatic body and tail up to 3.2cm, mildly worsened since earlier noncontrast CT study. No evidence of superimposed acute pancreatitis. 5. Aortic Atherosclerosis (ICD10-I70.0).   Echo 08/26/19 - EF 60-65%   EGD 11/13/19 -  - Multiple small white plaques were found in the upper third of the esophagus, grossly consistent with esophageal candidiasis. - The exam of the esophagus was otherwise normal. - Patchy mild inflammation characterized by erythema was found in the gastric body and in the gastric antrum. - The exam of the stomach was otherwise normal. - Biopsies were taken with a cold forceps in the gastric body, at the incisura and in the gastric antrum for Helicobacter pylori testing. - One non-bleeding cratered duodenal ulcer with no stigmata of bleeding was found in the distal duodenal bulb (entering first portion duodenal sweep), surrounded by some nodular inflammatory appearing mucosa. The lesion was 4 mm in largest dimension. Biopsies were taken with a cold forceps for histology from the periphery of it / nodular mucosa. - Patchy inflammation  characterized by erosions was found in the duodenal bulb. - The exam of the duodenum was otherwise normal.  Colonoscopy 11/13/19 - The perianal and digital rectal examinations were normal. - The terminal ileum appeared normal. - Two sessile polyps were found in the transverse colon. The polyps were 3 to 7 mm in size. These polyps were removed with a cold snare. Resection and retrieval were complete. - A 3 to 4 mm polyp was found in the sigmoid colon. The polyp was sessile. The polyp was removed with a cold snare. Resection and retrieval were complete. - Internal hemorrhoids were found during retroflexion. The hemorrhoids were small. - The exam was otherwise without abnormality. There was some residual gum / seeds in the cecum which were lavaged / washed and no polyps / lesions noted in the cecum. - Biopsies for histology were taken with a cold forceps from the right colon, left colon and transverse colon for evaluation of microscopic colitis.  1. Surgical [P], duodenal ulcer - PEPTIC DUODENITIS. EVIDENCE OF HEALING EROSION/ULCER. - WARTHIN-STARRY STAIN IS NEGATIVE FOR HELICOBACTER PYLORI. 2. Surgical [P], gastric antrum and gastric body - MILD CHRONIC GASTRITIS. MILD REACTION GASTROPATHY. - WARTHIN-STARRY STAIN IS NEGATIVE FOR HELICOBACTER PYLORI. 3. Surgical [P], colon, transverse, sigmoid, polyp (3) - TUBULAR ADENOMA, NEGATIVE FOR HIGH GRADE DYSPLASIA (X1). -HYPERPLASTIC POLYP (X2). 4. Surgical [P], colon, random sites - NO SIGNIFICANT PATHOLOGIC FINDINGS. - NEGATIVE FOR ACTIVE INFLAMMATION AND OTHER ABNORMALITIES. Anastasia Canacci  MRCP 11/22/19 - IMPRESSION: 1. Findings of chronic pancreatitis with decreased and new/increased cystic lesions throughout compared to 08/08/2019 CT (given difficulty in cross  modality comparison). These are likely a combination of foci of duct dilatation and intraparenchymal pseudocysts. No evidence of acute pancreatitis. 2. No biliary duct  dilatation. 3.  Possible constipation. 4. Chronic gastric wall thickening, again suspicious for gastritis. 5.  Aortic Atherosclerosis (ICD10-I70.0).  Gastrin level 11/26/19 - 1635  Chromograinin A level 12/18/19 - 1296  EUS 01/02/20 - Medium sized (4.8cm) thick walled cystic lesion in the body of the pancreas. This was nearly completley aspirated, yielding 120cc of brownish, old blood tinged fluid with solid flecks of tissue which was sent for cytology, CEA and amylase. - Following cyst aspiration the pancreatic parenchyma showed pretty clear signs of chronic pancreatitis but no obvious mass lesions (except to the residual 'rind' from the cyst aspiration above). The changes of chronic pancreatitis signficantly limit the sensitivity of EUS (and other imaging modalities) to detect neolasm. - The previously noted duodenal bulb ulcer is much improved in the interim.  Cytology  Negative Lipase / amylase level not returned   He continues to feel poorly since have last seen him.  He has ongoing persistent pain in his upper abdomen most of the time, rated 7 out of 10.  Eating tends to make it worse.  He has loss of appetite from this.  He is been eating less due to symptoms.  He takes 2 Creon's with each meal and that has helped him.  He thinks he is lost some more weight since of last seen him, now down to 112 pounds.  He denies any NSAID use.  He has been using Tylenol as needed.  I previously referred him to pain management as he is requested low-dose narcotics in the past to manage this was does help him, he has not been able to get insurance to go to pain management yet although now is willing to pay out-of-pocket for it if he does not get insurance.  He states he has continued to drink alcohol at his previous volumes, his last drink was about 2 weeks ago, he stopped as he was feeling poorly.  He can does continue to smoke cigarettes routinely.  He continues have reflux symptoms that bother him,  has clear food triggers to include chocolate, tomato-based foods.  Was previously on Dexilant and doing better but has been off it the past 3 months, has not been able to get that prescription filled.  Taking famotidine which does not help him much at all.  Has been taking Carafate which does help.  His last EUS showed interval healing of duodenal ulcer.  He has placed a claim for disability and is waiting to get insurance with Medicaid.  He has loose stools twice daily which is been chronic for him.  His colonoscopy showed no inflammatory changes.  Discussed options.    Past Medical History:  Diagnosis Date  . Alcoholism /alcohol abuse   . Anemia   . Anxiety   . Arthritis    "knees; arms; elbows" (03/26/2015)  . Asthma   . Bipolar disorder ([place])   . Chronic bronchitis ([place])   . Chronic lower back pain   . Chronic pancreatitis ([place])   . Cocaine abuse ([place])   . Depression   . Family history of adverse reaction to anesthesia    "grandmother gets confused"  . Femoral condyle fracture ([place]) 03/08/2014   left medial/notes 03/09/2014  . GERD (gastroesophageal reflux disease)   . H/O hiatal hernia   . H/O suicide attempt 10/2012  . High cholesterol   . History  of blood transfusion 10/2012   "when I tried to commit suicide"  . History of stomach ulcers   . Hypertension   . Marijuana abuse, continuous   . Migraine    "a few times/year" (03/26/2015)  . Pneumonia 1990's X 3  . PTSD (post-traumatic stress disorder)   . Sickle cell trait ([place])   . WPW (Wolff-Parkinson-White syndrome)    [name] 03/06/2013     Past Surgical History:  Procedure Laterality Date  . BIOPSY  11/25/2017   Procedure: BIOPSY;  Surgeon: Arta Silence, MD;  Location: [facility] ENDOSCOPY;  Service: Endoscopy;;  . BIOPSY  10/14/2018   Procedure: BIOPSY;  Surgeon: Arta Silence, MD;  Location: [facility];  Service: Endoscopy;;  . CARDIAC CATHETERIZATION    . CYST ENTEROSTOMY  01/02/2020   Procedure: CYST ASPIRATION;   Surgeon: Milus Banister, MD;  Location: [facility] ENDOSCOPY;  Service: Endoscopy;;  . ESOPHAGOGASTRODUODENOSCOPY (EGD) WITH PROPOFOL N/A 11/25/2017   Procedure: ESOPHAGOGASTRODUODENOSCOPY (EGD) WITH PROPOFOL;  Surgeon: Arta Silence, MD;  Location: [facility];  Service: Endoscopy;  Laterality: N/A;  . ESOPHAGOGASTRODUODENOSCOPY (EGD) WITH PROPOFOL Left 10/14/2018   Procedure: ESOPHAGOGASTRODUODENOSCOPY (EGD) WITH PROPOFOL;  Surgeon: Arta Silence, MD;  Location: [facility] ENDOSCOPY;  Service: Endoscopy;  Laterality: Left;  . ESOPHAGOGASTRODUODENOSCOPY (EGD) WITH PROPOFOL N/A 11/14/2018   Procedure: ESOPHAGOGASTRODUODENOSCOPY (EGD) WITH PROPOFOL;  Surgeon: Laurence Spates, MD;  Location: [facility] ENDOSCOPY;  Service: Gastroenterology;  Laterality: N/A;  . ESOPHAGOGASTRODUODENOSCOPY (EGD) WITH PROPOFOL N/A 01/02/2020   Procedure: ESOPHAGOGASTRODUODENOSCOPY (EGD) WITH PROPOFOL;  Surgeon: Milus Banister, MD;  Location: [facility] ENDOSCOPY;  Service: Endoscopy;  Laterality: N/A;  . EUS N/A 01/02/2020   Procedure: UPPER ENDOSCOPIC ULTRASOUND (EUS) RADIAL;  Surgeon: Milus Banister, MD;  Location: [facility] ENDOSCOPY;  Service: Endoscopy;  Laterality: N/A;  . EYE SURGERY Left 1990's   "result of trauma"   . FACIAL FRACTURE SURGERY Left 1990's   "result of trauma"   . FRACTURE SURGERY    . HERNIA REPAIR    . LEFT HEART CATHETERIZATION WITH CORONARY ANGIOGRAM Right 03/07/2013   Procedure: LEFT HEART CATHETERIZATION WITH CORONARY ANGIOGRAM;  Surgeon: [name], MD;  Location: [place] CATH LAB;  Service: Cardiovascular;  Laterality: Right;  . UMBILICAL HERNIA REPAIR    . UPPER GASTROINTESTINAL ENDOSCOPY     Family History  Problem Relation Age of Onset  . Hypertension Mother   . Cirrhosis Mother   . Alcoholism Mother   . Hypertension Father   . Melanoma Father   . Hypertension Other   . Coronary artery disease Other    Social History   Tobacco Use  . Smoking status: Current Every Day Smoker    Packs/day: 1.00    Years:  36.00    Pack years: 36.00    Types: Cigarettes, E-cigarettes  . Smokeless tobacco: Never Used  Vaping Use  . Vaping Use: Former  Substance Use Topics  . Alcohol use: Yes  . Drug use: Yes    Types: Marijuana, Cocaine    Comment: daily marijuana use; last cocaine use about 3 months ago   Current Outpatient Medications  Medication Sig Dispense Refill  . acetaminophen (TYLENOL) 500 MG tablet Take 2 tablets (1,000 mg total) by mouth every 8 (eight) hours as needed for mild pain. 30 tablet 0  . albuterol (VENTOLIN HFA) 108 (90 Base) MCG/ACT inhaler Inhale 2 puffs into the lungs every 4 (four) hours as needed for wheezing or shortness of breath. 18 g 11  . celecoxib (CELEBREX) 200 MG capsule Take  1 capsule (200 mg total) by mouth 2 (two) times daily. 20 capsule 0  . chlorhexidine (PERIDEX) 0.12 % solution Use as directed 15 mLs in the mouth or throat 2 (two) times daily. 120 mL 0  . Cyanocobalamin (VITAMIN B-12 PO) Take 1 tablet by mouth daily.    . cyclobenzaprine (FLEXERIL) 10 MG tablet Take 1 tablet (10 mg total) by mouth 2 (two) times daily as needed for muscle spasms. 20 tablet 0  . famotidine (PEPCID) 20 MG tablet Take 1 tablet (20 mg total) by mouth 2 (two) times daily. 30 tablet 0  . fluticasone furoate-vilanterol (BREO ELLIPTA) 200-25 MCG/INH AEPB Inhale 1 puff into the lungs daily. 1 each 11  . folic acid (FOLVITE) 1 MG tablet Take 1 tablet (1 mg total) by mouth daily. 30 tablet 0  . hydrALAZINE (APRESOLINE) 25 MG tablet Take 1 tablet (25 mg total) by mouth 3 (three) times daily. 90 tablet 3  . HYDROcodone-acetaminophen (NORCO/VICODIN) 5-325 MG tablet Take 1-2 tablets by mouth every 6 (six) hours as needed. 6 tablet 0  . hydrOXYzine (VISTARIL) 25 MG capsule Take 1 capsule (25 mg total) by mouth 3 (three) times daily as needed for anxiety. 90 capsule 3  . lidocaine (XYLOCAINE) 2 % solution Use as directed 15 mLs in the mouth or throat every 4 (four) hours as needed for mouth pain. 100 mL  0  . lipase/protease/amylase (CREON) 36000 UNITS CPEP capsule Take 2 capsules (72,000 Units total) by mouth 3 (three) times daily with meals. For pancreatitis (Patient taking differently: Take 72,000 Units by mouth 3 (three) times daily with meals. ) 180 capsule 5  . loperamide (IMODIUM A-D) 2 MG tablet Take 2 mg by mouth 4 (four) times daily as needed for diarrhea or loose stools.     . metoprolol tartrate (LOPRESSOR) 100 MG tablet Take 1 tablet (100 mg total) by mouth 2 (two) times daily. 60 tablet 3  . Multiple Vitamin (MULTIVITAMIN WITH MINERALS) TABS tablet Take 1 tablet by mouth daily. 30 tablet 0  . NASONEX 50 MCG/ACT nasal spray Place 2 sprays into the nose daily as needed (allergies).     [name] oxyCODONE (OXY IR/ROXICODONE) 5 MG immediate release tablet Take 5 mg by mouth every 8 (eight) hours as needed for pain.    . potassium chloride SA (K-DUR) 20 MEQ tablet Take 1 tablet (20 mEq total) by mouth daily. (Patient taking differently: Take 20 mEq by mouth daily as needed (potassium). ) 3 tablet 0  . QUEtiapine (SEROQUEL) 50 MG tablet Take 1 tablet (50 mg total) by mouth at bedtime. 30 tablet 3  . sertraline (ZOLOFT) 100 MG tablet Take 1 tablet (100 mg total) by mouth daily. 30 tablet 3  . sucralfate (CARAFATE) 1 g tablet Take 1 tablet (1 g total) by mouth 4 (four) times daily -  with meals and at bedtime. 120 tablet 3  . thiamine 100 MG tablet Take 1 tablet (100 mg total) by mouth daily. 30 tablet 0  . dexlansoprazole (DEXILANT) 60 MG capsule Take 1 capsule (60 mg total) by mouth daily. (Patient not taking: Reported on 03/27/2020) 90 capsule 3   No current facility-administered medications for this visit.   Allergies  Allergen Reactions  . Robaxin [Methocarbamol] Other (See Comments)    "jumpy limbs"  . Shellfish-Derived Products Nausea And Vomiting  . Trazodone And Nefazodone Other (See Comments)    Muscle spasms  . Adhesive [Tape] Itching  . Latex Itching  . Toradol [Ketorolac  Tromethamine] Other (See Comments)    Has ulcers; cannot have this  . Contrast Media [Iodinated Diagnostic Agents] Hives  . Reglan [Metoclopramide] Other (See Comments)    Muscle spasms     Review of Systems: All systems reviewed and negative except where noted in HPI.   Lab Results  Component Value Date   WBC 6.7 03/29/2020   HGB 11.6 (L) 03/29/2020   HCT 36.6 (L) 03/29/2020   MCV 102.5 (H) 03/29/2020   PLT 239 03/29/2020    Lab Results  Component Value Date   CREATININE 0.65 03/29/2020   BUN 6 03/29/2020   NA 137 03/29/2020   K 3.9 03/29/2020   CL 108 03/29/2020   CO2 20 (L) 03/29/2020    Lab Results  Component Value Date   ALT 58 (H) 03/05/2020   AST 81 (H) 03/05/2020   ALKPHOS 74 03/05/2020   BILITOT 1.1 03/05/2020     Physical Exam: BP 110/78 (BP Location: Left Arm, Patient Position: Sitting, Cuff Size: Normal)   Pulse 80   Ht 5' 7.25" (1.708 m) Comment: height measured without shoes  Wt 112 lb 8 oz (51 kg)   BMI 17.49 kg/m  Constitutional: Pleasant,male in no acute distress. HEENT: Normocephalic and atraumatic. Conjunctivae are normal. No scleral icterus. Neck supple.  Cardiovascular: Normal rate, regular rhythm.  Pulmonary/chest: Effort normal and breath sounds normal.  Abdominal: Soft, nondistended, mild epigastric tenderness to palpation.  There are no masses palpable.  Extremities: no edema Lymphadenopathy: No cervical adenopathy noted. Neurological: Alert and oriented to person place and time. Skin: Skin is warm and dry. No rashes noted. Psychiatric: Normal mood and affect. Behavior is normal.   ASSESSMENT AND PLAN: 49 year old male here for reassessment of the following issues:  Chronic pancreatitis Chronic abdominal pain Alcohol abuse Tobacco use GERD History of duodenal ulcer  History as outlined above.  Patient is most recently status post EUS with drainage of large pancreatic cyst per Ardis Hughs, which came back as benign.  He has  severe chronic pancreatitis, he has ongoing alcohol abuse and tobacco use which is driving this process.  I counseled him extensively to avoid all alcohol and tobacco use, he understands this is making his condition worse and has not drank any alcohol for the past 2 weeks.  He seems motivated to quit based on his current state.  We will continue Creon as currently dosed as this is providing some benefit.  I do think he would benefit from chronic pain management at this point given his ongoing pain.  He is willing to pay out of pocket if needed to have someone manage this.  I will refer him back to pain management, suspect he may need some baseline narcotic to deal with this and allow him to function better and work over time.  I gave him a very small supply of Norco today to use for severe pain only, counseled him I will not refill this moving forward and this needs to come from pain management, as I do not prescribe this for long-term pain control.  He also has a history of severe GERD and duodenal ulcers.  His gastrin level was quite elevated although that was in the setting of PPI.  However his chromogranin A level was also markedly elevated.  Given his ongoing symptoms, history of duodenal ulcer, ongoing diarrhea, I do think he needs further screening to ensure no evidence of gastrinoma.  I will refer him for a dotatate PET scan in light of constellation  of findings and his elevated chromogranin A.  He was agreeable to that.  Since he has not been able to get Dexilant covered, I will switch him back over to omeprazole 40 mg once a day to twice daily as needed.  He will continue Carafate.  He can stop Pepcid.  He should continue to avoid all NSAIDs.  He agreed with the plan, I will get in touch with him regarding results of PET scan once available.  Jellico Cellar, MD [facility] Gastroenterology

## 2020-04-07 NOTE — Patient Instructions (Addendum)
If you are age 50 or older, your body mass index should be between 23-30. Your Body mass index is 17.49 kg/m. If this is out of the aforementioned range listed, please consider follow up with your Primary Care Provider.  If you are age 80 or younger, your body mass index should be between 19-25. Your Body mass index is 17.49 kg/m. If this is out of the aformentioned range listed, please consider follow up with your Primary Care Provider.   We have sent the following medications to your pharmacy for you to pick up at your convenience: Omeprazole 40 mg: Take once to twice daily as needed  Continue Carafate.  Stop Pepcid.  You will be contacted to be scheduled for a Detotate PET scan to rule out gastrinoma.    We will refer you to Pain Management.  Thank you for entrusting me with your care and for choosing [facility], Dr. [place] Cellar

## 2020-04-10 IMAGING — CR DG CHEST 2V
2 series · 2 of 2 positions shown · non-contrast
Comparison: September 04, 2017

CLINICAL DATA: Chest pain

EXAM:
CHEST - 2 VIEW

[chest pa]
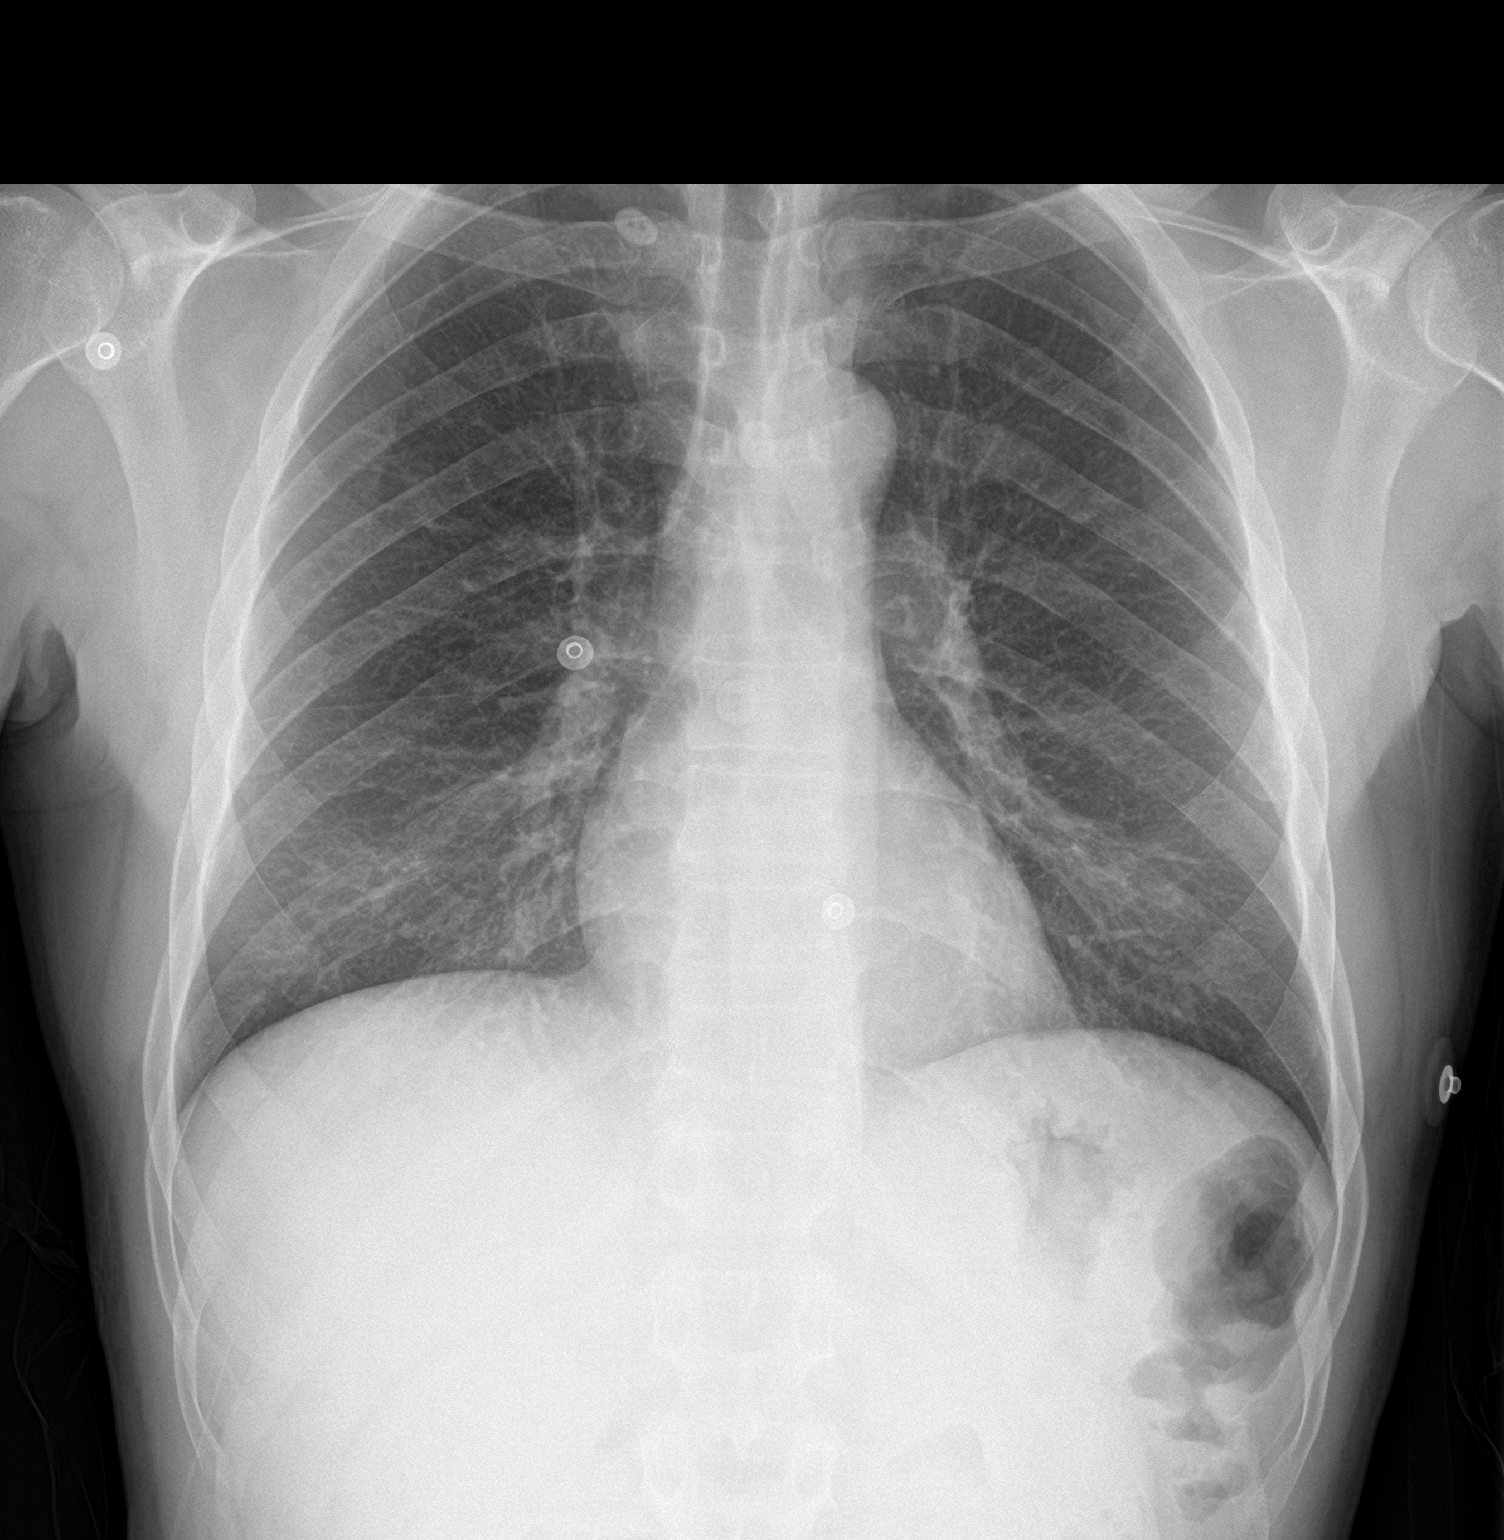

[chest lat]
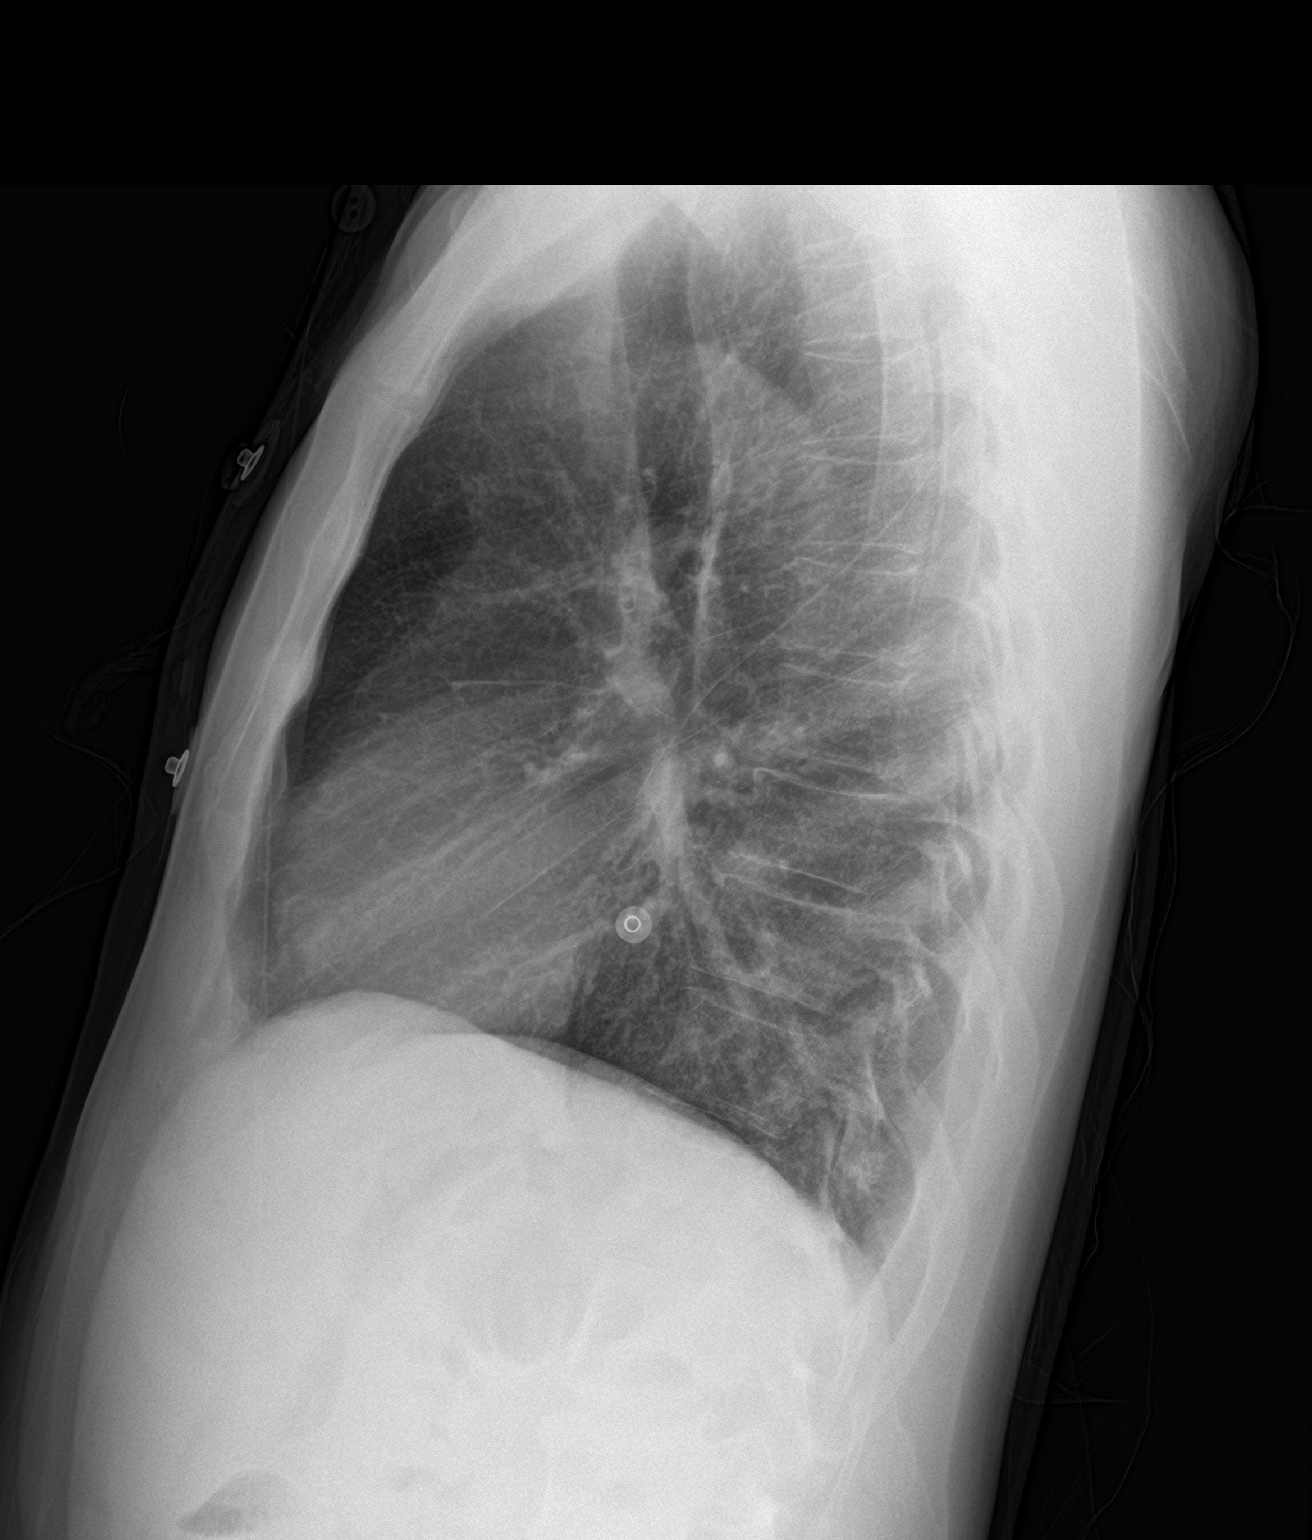

[2 of 2 positions shown; findings below may reference images not displayed]

FINDINGS: The heart size and mediastinal contours are within normal limits.
Both lungs are clear. The visualized skeletal structures are
unremarkable.
IMPRESSION: No active cardiopulmonary disease.

## 2020-04-13 ENCOUNTER — Telehealth: Payer: Self-pay | Admitting: Gastroenterology

## 2020-04-13 NOTE — Telephone Encounter (Signed)
Lm on vm for patient to return call 

## 2020-04-14 NOTE — Telephone Encounter (Signed)
Spoke with patient, advised him to bring his updated insurance card to our office so that we can scan it into his chart and work on the referral for pain management. Pt states that the coverage is not effective until 05/09/2020. Patient verbalized understanding of all information and had no concerns at the end of the call.

## 2020-04-28 NOTE — Telephone Encounter (Signed)
Patient brought insurance cards by the office yesterday. Medicaid of [place] effective starting 04/28/20 and [employer] effective starting 05/09/2020.   [facility] in [place] - they do accept Medicaid.  Referral form and records faxed to referral fax line at [phone].   Left detailed message letting patient know that we have initiated the referral to pain management and they should be in contact with him in about a week, provided patient with their number as well. Advised him to give me a call if he had any questions.

## 2020-04-29 NOTE — Telephone Encounter (Addendum)
Received fax from [facility] stating " We decline to accept this patient for scheduling at this time. We do not take patient's insurance."  I called Cecille Rubin, the new patient referral coordinator to clarify the decision, as I was told that they do accept Medicaid. Will await a return call.

## 2020-04-29 NOTE — Telephone Encounter (Signed)
Okay thanks. So, to clarify, he can be seen there he just can't schedule until January?

## 2020-04-29 NOTE — Telephone Encounter (Signed)
Spoke with Barbera Setters at [facility] pain institute, she states that patient was denied because [place] will be his primary insurance in January and [place] would be secondary and unfortunately they would not be able to schedule him until January.

## 2020-05-01 ENCOUNTER — Encounter (HOSPITAL_COMMUNITY): Payer: Self-pay | Admitting: *Deleted

## 2020-05-01 ENCOUNTER — Other Ambulatory Visit: Payer: Self-pay

## 2020-05-01 ENCOUNTER — Emergency Department (HOSPITAL_COMMUNITY)
Admission: EM | Admit: 2020-05-01 | Discharge: 2020-05-01 | Disposition: A | Discharge status: Home / Self Care | Payer: Medicaid Other | Attending: Emergency Medicine | Admitting: Emergency Medicine

## 2020-05-01 DIAGNOSIS — Z79899 Other long term (current) drug therapy: Secondary | ICD-10-CM | POA: Diagnosis not present

## 2020-05-01 DIAGNOSIS — R1084 Generalized abdominal pain: Secondary | ICD-10-CM

## 2020-05-01 DIAGNOSIS — R197 Diarrhea, unspecified: Secondary | ICD-10-CM | POA: Diagnosis not present

## 2020-05-01 DIAGNOSIS — K219 Gastro-esophageal reflux disease without esophagitis: Secondary | ICD-10-CM | POA: Diagnosis not present

## 2020-05-01 DIAGNOSIS — R112 Nausea with vomiting, unspecified: Secondary | ICD-10-CM | POA: Insufficient documentation

## 2020-05-01 DIAGNOSIS — I10 Essential (primary) hypertension: Secondary | ICD-10-CM | POA: Diagnosis not present

## 2020-05-01 DIAGNOSIS — Z91040 Latex allergy status: Secondary | ICD-10-CM | POA: Diagnosis not present

## 2020-05-01 DIAGNOSIS — F17210 Nicotine dependence, cigarettes, uncomplicated: Secondary | ICD-10-CM | POA: Diagnosis not present

## 2020-05-01 DIAGNOSIS — J45909 Unspecified asthma, uncomplicated: Secondary | ICD-10-CM | POA: Insufficient documentation

## 2020-05-01 LAB — CBC WITH DIFFERENTIAL/PLATELET
Abs Immature Granulocytes: 0.04 10*3/uL (ref 0.00–0.07)
Basophils Absolute: 0 10*3/uL (ref 0.0–0.1)
Basophils Relative: 0 %
Eosinophils Absolute: 0 10*3/uL (ref 0.0–0.5)
Eosinophils Relative: 0 %
HCT: 36.9 % — ABNORMAL LOW (ref 39.0–52.0)
Hemoglobin: 11.7 g/dL — ABNORMAL LOW (ref 13.0–17.0)
Immature Granulocytes: 0 %
Lymphocytes Relative: 17 %
Lymphs Abs: 1.6 10*3/uL (ref 0.7–4.0)
MCH: 32.2 pg (ref 26.0–34.0)
MCHC: 31.7 g/dL (ref 30.0–36.0)
MCV: 101.7 fL — ABNORMAL HIGH (ref 80.0–100.0)
Monocytes Absolute: 1 10*3/uL (ref 0.1–1.0)
Monocytes Relative: 11 %
Neutro Abs: 6.7 10*3/uL (ref 1.7–7.7)
Neutrophils Relative %: 72 %
Platelets: 201 10*3/uL (ref 150–400)
RBC: 3.63 MIL/uL — ABNORMAL LOW (ref 4.22–5.81)
RDW: 15.3 % (ref 11.5–15.5)
WBC: 9.5 10*3/uL (ref 4.0–10.5)
nRBC: 0 % (ref 0.0–0.2)

## 2020-05-01 LAB — LIPASE, BLOOD: Lipase: 33 U/L (ref 11–51)

## 2020-05-01 LAB — COMPREHENSIVE METABOLIC PANEL
ALT: 65 U/L — ABNORMAL HIGH (ref 0–44)
AST: 52 U/L — ABNORMAL HIGH (ref 15–41)
Albumin: 3.6 g/dL (ref 3.5–5.0)
Alkaline Phosphatase: 89 U/L (ref 38–126)
Anion gap: 13 (ref 5–15)
BUN: 9 mg/dL (ref 6–20)
CO2: 24 mmol/L (ref 22–32)
Calcium: 8.7 mg/dL — ABNORMAL LOW (ref 8.9–10.3)
Chloride: 108 mmol/L (ref 98–111)
Creatinine, Ser: 0.71 mg/dL (ref 0.61–1.24)
GFR, Estimated: >60 mL/min (ref >60)
Glucose, Bld: 113 mg/dL — ABNORMAL HIGH (ref 70–99)
Potassium: 4.1 mmol/L (ref 3.5–5.1)
Sodium: 145 mmol/L (ref 135–145)
Total Bilirubin: 0.7 mg/dL (ref 0.3–1.2)
Total Protein: 6.7 g/dL (ref 6.5–8.1)

## 2020-05-01 MED ORDER — LIDOCAINE VISCOUS HCL 2 % MT SOLN
15.0000 mL | Freq: Once | OROMUCOSAL | Status: AC
  Administered 2020-05-01: 12:00:00 15 mL via ORAL
  Filled 2020-05-01: qty 15

## 2020-05-01 MED ORDER — SUCRALFATE 1 G PO TABS: 1.0000 g | ORAL_TABLET | Freq: Three times a day (TID) | ORAL | 0 refills | Status: DC

## 2020-05-01 MED ORDER — SODIUM CHLORIDE 0.9 % IV BOLUS
1000.0000 mL | Freq: Once | INTRAVENOUS | Status: AC
  Administered 2020-05-01: 12:00:00 1000 mL via INTRAVENOUS

## 2020-05-01 MED ORDER — ALUM & MAG HYDROXIDE-SIMETH 200-200-20 MG/5ML PO SUSP
30.0000 mL | Freq: Once | ORAL | Status: AC
  Administered 2020-05-01: 12:00:00 30 mL via ORAL
  Filled 2020-05-01: qty 30

## 2020-05-01 MED ORDER — HALOPERIDOL LACTATE 5 MG/ML IJ SOLN
5.0000 mg | Freq: Once | INTRAMUSCULAR | Status: AC
  Administered 2020-05-01: 12:00:00 5 mg via INTRAVENOUS
  Filled 2020-05-01: qty 1

## 2020-05-01 NOTE — ED Triage Notes (Signed)
States he started having chest pain 1030 today with radiation to his left arm and jaw , now c/o abd. Pain , thinks its his pancreatitis, states he hasn't drank any alcohol in several days. C/o 10/10 abd. Pain .

## 2020-05-01 NOTE — ED Notes (Signed)
Got patient on the monitor did ekg shown to Dr Ronnald Nian patient is resting with call bell in reach

## 2020-05-01 NOTE — ED Provider Notes (Signed)
[facility] EMERGENCY DEPARTMENT Provider Note   CSN: [id number] Arrival date & time: 05/01/20  1128     History Chief Complaint  Patient presents with  . Abdominal Pain    [name] is a 49 y.o. male.  The history is provided by the patient.  Abdominal Pain Pain location:  Generalized Pain quality: aching   Pain radiates to:  Does not radiate Pain severity:  Mild Onset quality:  Gradual Duration:  1 hour Timing:  Constant Progression:  Unchanged Chronicity:  Recurrent Context: alcohol use (denies recent use but smoke marijuana daily. Denies trauma or black or bloody stools)   Relieved by:  Nothing Worsened by:  Nothing Associated symptoms: diarrhea, nausea and vomiting   Associated symptoms: no anorexia, no belching, no chest pain, no chills, no constipation, no cough, no dysuria, no fever, no hematemesis, no hematochezia, no hematuria, no shortness of breath and no sore throat        Past Medical History:  Diagnosis Date  . Alcoholism /alcohol abuse   . Anemia   . Anxiety   . Arthritis    "knees; arms; elbows" (03/26/2015)  . Asthma   . Bipolar disorder ([place])   . Chronic bronchitis ([place])   . Chronic lower back pain   . Chronic pancreatitis ([place])   . Cocaine abuse ([place])   . Depression   . Family history of adverse reaction to anesthesia    "grandmother gets confused"  . Femoral condyle fracture ([place]) 03/08/2014   left medial/notes 03/09/2014  . GERD (gastroesophageal reflux disease)   . H/O hiatal hernia   . H/O suicide attempt 10/2012  . High cholesterol   . History of blood transfusion 10/2012   "when I tried to commit suicide"  . History of stomach ulcers   . Hypertension   . Marijuana abuse, continuous   . Migraine    "a few times/year" (03/26/2015)  . Pneumonia 1990's X 3  . PTSD (post-traumatic stress disorder)   . Sickle cell trait ([place])   . WPW (Wolff-Parkinson-White syndrome)    [name] 03/06/2013    Patient  Active Problem List   Diagnosis Date Noted  . AKI (acute kidney injury) ([place]) 11/13/2018  . Seizure ([place]) 11/13/2018  . Gastritis and gastroduodenitis   . Chest pain 01/08/2018  . GI bleed 11/24/2017  . Acute blood loss anemia 11/24/2017  . Atypical chest pain 11/24/2017  . Acute pancreatitis 09/28/2017  . Abdominal pain 05/27/2017  . Hematemesis 05/27/2017  . Tachycardia 03/18/2017  . Diarrhea 03/18/2017  . Acute on chronic pancreatitis ([place]) 12/17/2016  . Intractable nausea and vomiting 12/05/2016  . Verbally abusive behavior 12/05/2016  . Normocytic anemia 12/05/2016  . Alcohol use disorder, severe, dependence ([place]) 07/25/2016  . Cocaine use disorder, severe, dependence ([place]) 07/25/2016  . 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 Leukocytosis   . Hospital acquired PNA 05/20/2015  . Chronic pancreatitis ([place]) 05/18/2015  . Pseudocyst of pancreas 05/18/2015  . Polysubstance abuse (tobacco, cocaine, THC, and ETOH) 03/26/2015  . Alcohol-induced chronic pancreatitis ([place])   . Essential hypertension 02/06/2014  . Mood disorder ([place]) 02/06/2014  . Alcohol-induced acute pancreatitis 11/28/2013  . Pancreatic pseudocyst/cyst 11/25/2013  . Severe protein-calorie malnutrition ([place]) 10/10/2013  . Suicide attempt ([place]) 10/08/2013  . 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 TOBACCO ABUSE 03/23/2007    Past Surgical History:  Procedure Laterality Date  . BIOPSY  11/25/2017   Procedure:  BIOPSY;  Surgeon: Arta Silence, MD;  Location: [facility] ENDOSCOPY;  Service: Endoscopy;;  . BIOPSY  10/14/2018   Procedure: BIOPSY;  Surgeon: Arta Silence, MD;  Location: [facility];  Service: Endoscopy;;  . CARDIAC CATHETERIZATION    . CYST ENTEROSTOMY  01/02/2020   Procedure: CYST ASPIRATION;  Surgeon: Milus Banister, MD;  Location: [facility] ENDOSCOPY;  Service: Endoscopy;;  . ESOPHAGOGASTRODUODENOSCOPY (EGD) WITH PROPOFOL N/A  11/25/2017   Procedure: ESOPHAGOGASTRODUODENOSCOPY (EGD) WITH PROPOFOL;  Surgeon: Arta Silence, MD;  Location: [facility];  Service: Endoscopy;  Laterality: N/A;  . ESOPHAGOGASTRODUODENOSCOPY (EGD) WITH PROPOFOL Left 10/14/2018   Procedure: ESOPHAGOGASTRODUODENOSCOPY (EGD) WITH PROPOFOL;  Surgeon: Arta Silence, MD;  Location: [facility] ENDOSCOPY;  Service: Endoscopy;  Laterality: Left;  . ESOPHAGOGASTRODUODENOSCOPY (EGD) WITH PROPOFOL N/A 11/14/2018   Procedure: ESOPHAGOGASTRODUODENOSCOPY (EGD) WITH PROPOFOL;  Surgeon: Laurence Spates, MD;  Location: [facility] ENDOSCOPY;  Service: Gastroenterology;  Laterality: N/A;  . ESOPHAGOGASTRODUODENOSCOPY (EGD) WITH PROPOFOL N/A 01/02/2020   Procedure: ESOPHAGOGASTRODUODENOSCOPY (EGD) WITH PROPOFOL;  Surgeon: Milus Banister, MD;  Location: [facility] ENDOSCOPY;  Service: Endoscopy;  Laterality: N/A;  . EUS N/A 01/02/2020   Procedure: UPPER ENDOSCOPIC ULTRASOUND (EUS) RADIAL;  Surgeon: Milus Banister, MD;  Location: [facility] ENDOSCOPY;  Service: Endoscopy;  Laterality: N/A;  . EYE SURGERY Left 1990's   "result of trauma"   . FACIAL FRACTURE SURGERY Left 1990's   "result of trauma"   . FRACTURE SURGERY    . HERNIA REPAIR    . LEFT HEART CATHETERIZATION WITH CORONARY ANGIOGRAM Right 03/07/2013   Procedure: LEFT HEART CATHETERIZATION WITH CORONARY ANGIOGRAM;  Surgeon: [name], MD;  Location: [facility] CATH LAB;  Service: Cardiovascular;  Laterality: Right;  . UMBILICAL HERNIA REPAIR    . UPPER GASTROINTESTINAL ENDOSCOPY         Family History  Problem Relation Age of Onset  . Hypertension Mother   . Cirrhosis Mother   . Alcoholism Mother   . Hypertension Father   . Melanoma Father   . Hypertension Other   . Coronary artery disease Other     Social History   Tobacco Use  . Smoking status: Current Every Day Smoker    Packs/day: 1.00    Years: 36.00    Pack years: 36.00    Types: Cigarettes, E-cigarettes  . Smokeless tobacco: Never Used  Vaping Use  . Vaping  Use: Former  Substance Use Topics  . Alcohol use: Yes  . Drug use: Yes    Types: Marijuana, Cocaine    Comment: daily marijuana use; last cocaine use about 3 months ago    Home Medications Prior to Admission medications   Medication Sig Start Date End Date Taking? Authorizing Provider  acetaminophen (TYLENOL) 500 MG tablet Take 2 tablets (1,000 mg total) by mouth every 8 (eight) hours as needed for mild pain. 08/29/19   Elodia Florence., MD  albuterol (VENTOLIN HFA) 108 (90 Base) MCG/ACT inhaler Inhale 2 puffs into the lungs every 4 (four) hours as needed for wheezing or shortness of breath. 11/12/19   [name], DO  celecoxib (CELEBREX) 200 MG capsule Take 1 capsule (200 mg total) by mouth 2 (two) times daily. 03/29/20   Margarita Mail, PA-C  chlorhexidine (PERIDEX) 0.12 % solution Use as directed 15 mLs in the mouth or throat 2 (two) times daily. 03/10/20   Petrucelli, Samantha R, PA-C  Cyanocobalamin (VITAMIN B-12 PO) Take 1 tablet by mouth daily.    [provider]  cyclobenzaprine (FLEXERIL) 10 MG tablet  Take 1 tablet (10 mg total) by mouth 2 (two) times daily as needed for muscle spasms. 03/03/19   Recardo Evangelist, PA-C  fluticasone furoate-vilanterol (BREO ELLIPTA) 200-25 MCG/INH AEPB Inhale 1 puff into the lungs daily. 11/12/19   [name], DO  folic acid (FOLVITE) 1 MG tablet Take 1 tablet (1 mg total) by mouth daily. 11/26/17   [name], MD  hydrALAZINE (APRESOLINE) 25 MG tablet Take 1 tablet (25 mg total) by mouth 3 (three) times daily. 03/27/20   [name], MD  HYDROcodone-acetaminophen (NORCO/VICODIN) 5-325 MG tablet Take 1 tablet by mouth every 8 (eight) hours as needed. 04/07/20   [name], [name], MD  hydrOXYzine (VISTARIL) 25 MG capsule Take 1 capsule (25 mg total) by mouth 3 (three) times daily as needed for anxiety. 03/27/20   [name], MD  lidocaine (XYLOCAINE) 2 % solution Use as directed 15 mLs in the mouth or throat every 4  (four) hours as needed for mouth pain. 03/27/20   [name], MD  lipase/protease/amylase (CREON) 36000 UNITS CPEP capsule Take 2 capsules (72,000 Units total) by mouth 3 (three) times daily with meals. For pancreatitis Patient taking differently: Take 72,000 Units by mouth 3 (three) times daily with meals.  11/05/19   [name], [name], MD  loperamide (IMODIUM A-D) 2 MG tablet Take 2 mg by mouth 4 (four) times daily as needed for diarrhea or loose stools.     [provider]  metoprolol tartrate (LOPRESSOR) 100 MG tablet Take 1 tablet (100 mg total) by mouth 2 (two) times daily. 03/27/20   [name], MD  Multiple Vitamin (MULTIVITAMIN WITH MINERALS) TABS tablet Take 1 tablet by mouth daily. 09/06/17   Bonnell Public, MD  NASONEX 50 MCG/ACT nasal spray Place 2 sprays into the nose daily as needed (allergies).  06/19/19   [provider]  omeprazole (PRILOSEC) 40 MG capsule Take once to twice daily as needed 04/07/20   [name], [name], MD  oxyCODONE (OXY IR/ROXICODONE) 5 MG immediate release tablet Take 5 mg by mouth every 8 (eight) hours as needed for pain. 03/11/20   [provider]  potassium chloride SA (K-DUR) 20 MEQ tablet Take 1 tablet (20 mEq total) by mouth daily. Patient taking differently: Take 20 mEq by mouth daily as needed (potassium).  01/07/19   Sherwood Gambler, MD  QUEtiapine (SEROQUEL) 50 MG tablet Take 1 tablet (50 mg total) by mouth at bedtime. 03/27/20   [name], MD  sertraline (ZOLOFT) 100 MG tablet Take 1 tablet (100 mg total) by mouth daily. 03/27/20   [name], MD  sucralfate (CARAFATE) 1 g tablet Take 1 tablet (1 g total) by mouth 4 (four) times daily -  with meals and at bedtime. 11/05/19   [name], [name], MD  sucralfate (CARAFATE) 1 g tablet Take 1 tablet (1 g total) by mouth 4 (four) times daily -  with meals and at bedtime for 14 doses. 05/01/20 05/05/20  Jersey Ravenscroft, DO  thiamine 100 MG tablet Take 1  tablet (100 mg total) by mouth daily. 10/16/18   [name], MD  amitriptyline (ELAVIL) 25 MG tablet Take 1 tablet (25 mg total) by mouth at bedtime. Patient not taking: Reported on 08/08/2019 10/15/18 08/08/19  [name], MD  gabapentin (NEURONTIN) 100 MG capsule Take 1 capsule (100 mg total) by mouth 2 (two) times daily. Patient not taking: Reported on 08/08/2019 10/15/18 08/08/19  [name], MD  pantoprazole ([place]) 40  MG tablet Take 1 tablet (40 mg total) by mouth 2 (two) times daily. Patient not taking: Reported on 11/17/2019 11/05/19 03/10/20  Yetta Flock, MD  promethazine (PHENERGAN) 25 MG tablet Take 1 tablet (25 mg total) by mouth every 6 (six) hours as needed for nausea or vomiting. Patient not taking: Reported on 08/08/2019 03/02/19 08/08/19  Kinnie Feil, PA-C    Allergies    Robaxin [methocarbamol], Shellfish-derived products, Trazodone and nefazodone, Adhesive [tape], Latex, Toradol [ketorolac tromethamine], Contrast media [iodinated diagnostic agents], and Reglan [metoclopramide]  Review of Systems   Review of Systems  Constitutional: Negative for chills and fever.  HENT: Negative for ear pain and sore throat.   Eyes: Negative for pain and visual disturbance.  Respiratory: Negative for cough and shortness of breath.   Cardiovascular: Negative for chest pain and palpitations.  Gastrointestinal: Positive for abdominal pain, diarrhea, nausea and vomiting. Negative for anorexia, constipation, hematemesis and hematochezia.  Genitourinary: Negative for dysuria and hematuria.  Musculoskeletal: Negative for arthralgias and back pain.  Skin: Negative for color change and rash.  Neurological: Negative for seizures and syncope.  All other systems reviewed and are negative.   Physical Exam  ED Triage Vitals [05/01/20 1140]  Enc Vitals Group     BP (!) 177/103     Pulse Rate 60     Resp 16     Temp 97.8 F (36.6 C)     Temp Source Oral     SpO2 98 %     Weight       Height      Head Circumference      Peak Flow      Pain Score      Pain Loc      Pain Edu?      Excl. in [place]?     Physical Exam Vitals and nursing note reviewed.  Constitutional:      General: He is not in acute distress.    Appearance: He is well-developed and well-nourished. He is not ill-appearing.  HENT:     Head: Normocephalic and atraumatic.     Mouth/Throat:     Mouth: Mucous membranes are moist.  Eyes:     Extraocular Movements: Extraocular movements intact.     Conjunctiva/sclera: Conjunctivae normal.     Pupils: Pupils are equal, round, and reactive to light.  Cardiovascular:     Rate and Rhythm: Normal rate and regular rhythm.     Heart sounds: Normal heart sounds. No murmur heard.   Pulmonary:     Effort: Pulmonary effort is normal. No respiratory distress.     Breath sounds: Normal breath sounds.  Abdominal:     General: Abdomen is flat. There is no distension.     Palpations: Abdomen is soft.     Tenderness: There is generalized abdominal tenderness. There is no guarding or rebound. Negative signs include Murphy's sign and Rovsing's sign.  Musculoskeletal:        General: No edema.     Cervical back: Neck supple.  Skin:    General: Skin is warm and dry.     Capillary Refill: Capillary refill takes less than 2 seconds.  Neurological:     General: No focal deficit present.     Mental Status: He is alert.  Psychiatric:        Mood and Affect: Mood and affect and mood normal.     ED Results / Procedures / Treatments   Labs (all labs ordered are listed,  but only abnormal results are displayed) Labs Reviewed  CBC WITH DIFFERENTIAL/PLATELET - Abnormal; Notable for the following components:      Result Value   RBC 3.63 (*)    Hemoglobin 11.7 (*)    HCT 36.9 (*)    MCV 101.7 (*)    All other components within normal limits  COMPREHENSIVE METABOLIC PANEL - Abnormal; Notable for the following components:   Glucose, Bld 113 (*)    Calcium 8.7 (*)     AST 52 (*)    ALT 65 (*)    All other components within normal limits  LIPASE, BLOOD    EKG EKG Interpretation  Date/Time:  Friday May 01 2020 11:35:56 EST Ventricular Rate:  62 PR Interval:    QRS Duration: 87 QT Interval:  389 QTC Calculation: 395 R Axis:   77 Text Interpretation: Sinus rhythm Short PR interval Nonspecific T abnormalities, diffuse leads Confirmed by Lennice Sites [phone]) on 05/01/2020 11:44:16 AM   Radiology No results found.  Procedures Procedures (including critical care time)  Medications Ordered in ED Medications  alum & mag hydroxide-simeth (MAALOX/MYLANTA) 200-200-20 MG/5ML suspension 30 mL (30 mLs Oral Given 05/01/20 1149)    And  lidocaine (XYLOCAINE) 2 % viscous mouth solution 15 mL (15 mLs Oral Given 05/01/20 1149)  haloperidol lactate (HALDOL) injection 5 mg (5 mg Intravenous Given 05/01/20 1153)  sodium chloride 0.9 % bolus 1,000 mL (0 mLs Intravenous Stopped 05/01/20 1310)    ED Course  I have reviewed the triage vital signs and the nursing notes.  Pertinent labs & imaging results that were available during my care of the patient were reviewed by me and considered in my medical decision making (see chart for details).    MDM Rules/Calculators/A&P                          [name] is here with history of high cholesterol, alcohol abuse, marijuana abuse, chronic pancreatitis who presents the ED with abdominal pain, nausea, vomiting.  Normal vitals.  No fever.  Got IV morphine with EMS.  Has mostly diffuse tenderness on exam but no rebound or signs to suggest peritonitis.  Vital signs are reassuring.  States that he has not had any alcohol recently.  But does admit to daily marijuana use.  Suspect may be hyperemesis from this.  Possibly pancreatitis.  EKG shows sinus rhythm.  Doubt cardiac process.  Will check basic labs including give patient IV fluid bolus, IV Haldol, GI cocktail.  Denies any melena or hematochezia.   Overall suspect chronic abdominal process likely from chronic alcohol abuse or hyperemesis from marijuana.  Have a low suspicion for acute abdominal process such as bowel obstruction at this time.  We will hold off on any CT imaging at this time and check lab work for any signs to suggest ongoing pancreatitis.  Lab work is unremarkable.  No significant anemia, electrolyte abnormality, kidney injury.  Patient overall appears well.  EKG shows sinus rhythm.  Doubt cardiac process.  I walked by the room patient appeared comfortable and texting on the phone.  I then went into the room and patient started to writhe around holding his abdomen.  He has multiple ED visits in the past for the same.  Suspect there is some aspect of malingering.  His abdomen is benign and no concern for acute intra-abdominal process.  Overall will prescribe Carafate and have him follow-up with his primary care doctor.  Patient discharged from ED in good condition.  This chart was dictated using voice recognition software.  Despite best efforts to proofread,  errors can occur which can change the documentation meaning.    Final Clinical Impression(s) / ED Diagnoses Final diagnoses:  Generalized abdominal pain    Rx / DC Orders ED Discharge Orders         Ordered    sucralfate (CARAFATE) 1 g tablet  3 times daily with meals & bedtime        05/01/20 1339           [place], DO 05/01/20 1547

## 2020-05-05 NOTE — Telephone Encounter (Signed)
Pt called stating that he is still waiting to get an appt at a pain clinic. Pt confirmed that he will have [employer] and Medicaid beginning in January, He is wondering if he should discontinue his coverage with [employer] if that would help him be seen at [facility]. Pls call him.

## 2020-05-07 NOTE — Telephone Encounter (Signed)
Lm on vm for patient to return call.   Called [facility], they stated that they are working on accepting [facility] coverage, they will have a definitive answer next week after the new year. They told me to still fax the referral and they will let us know if they will be accepting [facility] or not. Referral and records faxed.

## 2020-05-12 NOTE — Telephone Encounter (Signed)
Lm on vm for new patient referral coordinator to return call to follow up on referral.

## 2020-05-12 NOTE — Telephone Encounter (Signed)
Lm on vm for Kathie Rhodes to return call.

## 2020-05-12 NOTE — Telephone Encounter (Signed)
Betty from [facility] is requesting a call back from a nurse.  CB [phone]

## 2020-05-12 NOTE — Telephone Encounter (Addendum)
Spoke with Kathie Rhodes, she states that they are still working on [employer] with [employer]. She states that she will continue to hold the patient's referral, she states that we will be notified if they are able to schedule patient.

## 2020-05-14 NOTE — Telephone Encounter (Signed)
Spoke with patient, he is aware that we are waiting to see if [facility] will be able to accept his insurance. Advised that once we hear from them we will let him know, advised that it may be next week before we hear anything. Patient verbalized understanding.

## 2020-05-15 IMAGING — DX DG ABDOMEN ACUTE W/ 1V CHEST
3 series · 3 of 3 positions shown · non-contrast
Comparison: Chest radiograph dated 10/19/2017

CLINICAL DATA: 39-year-old male with abdominal pain.

EXAM:
DG ABDOMEN ACUTE W/ 1V CHEST

[chest pa]
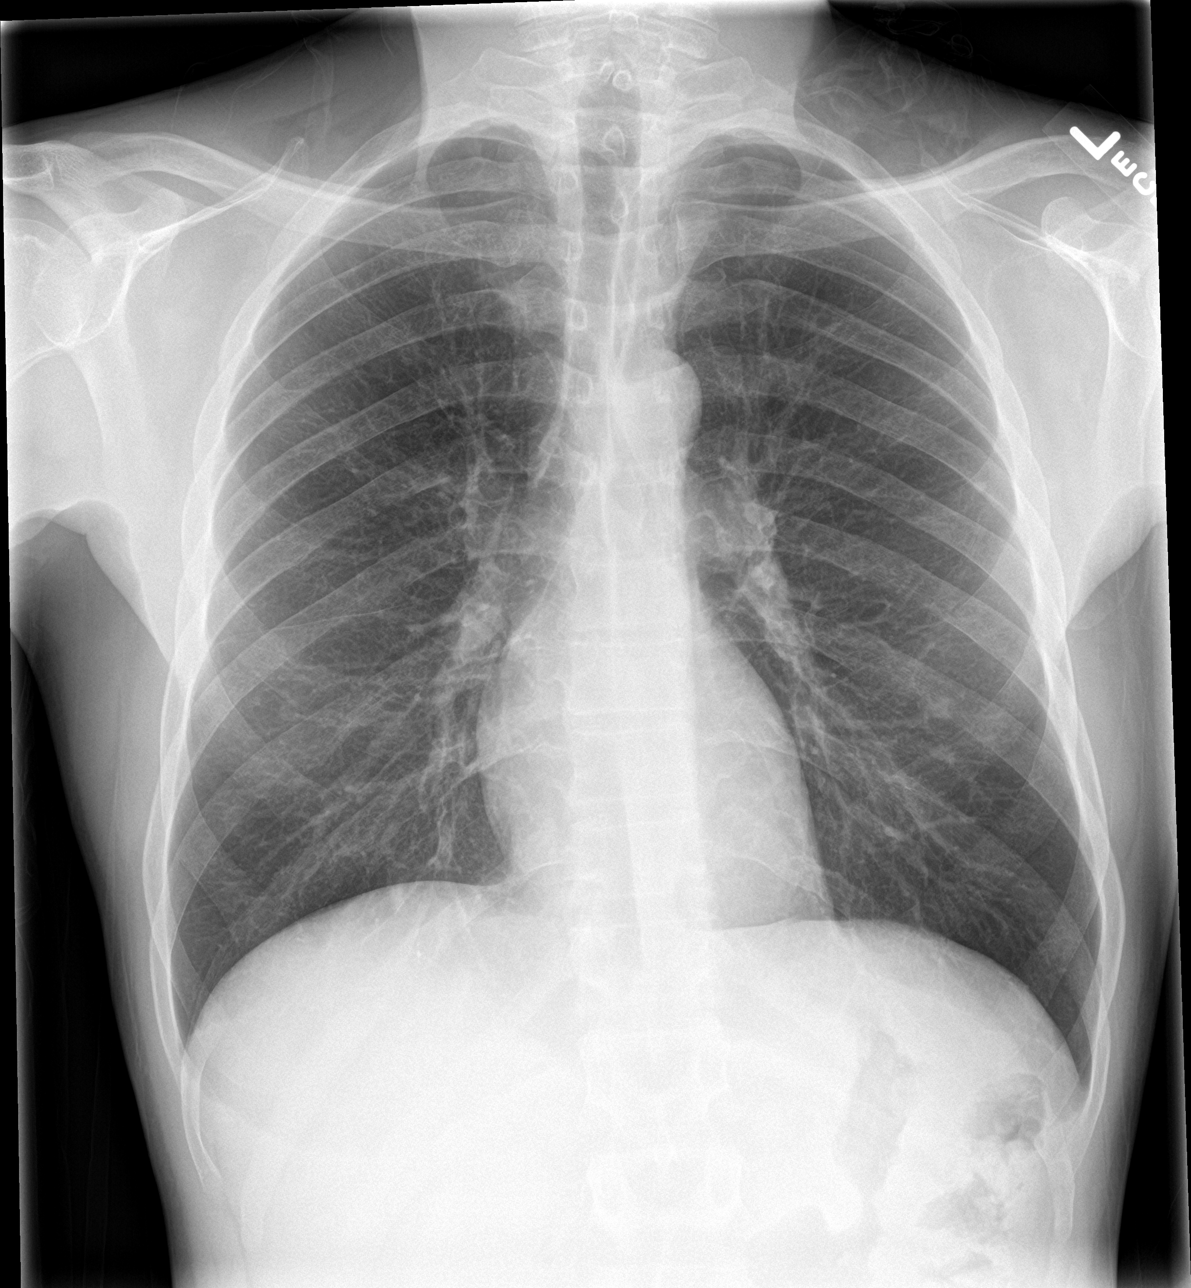

[abdomen erect]
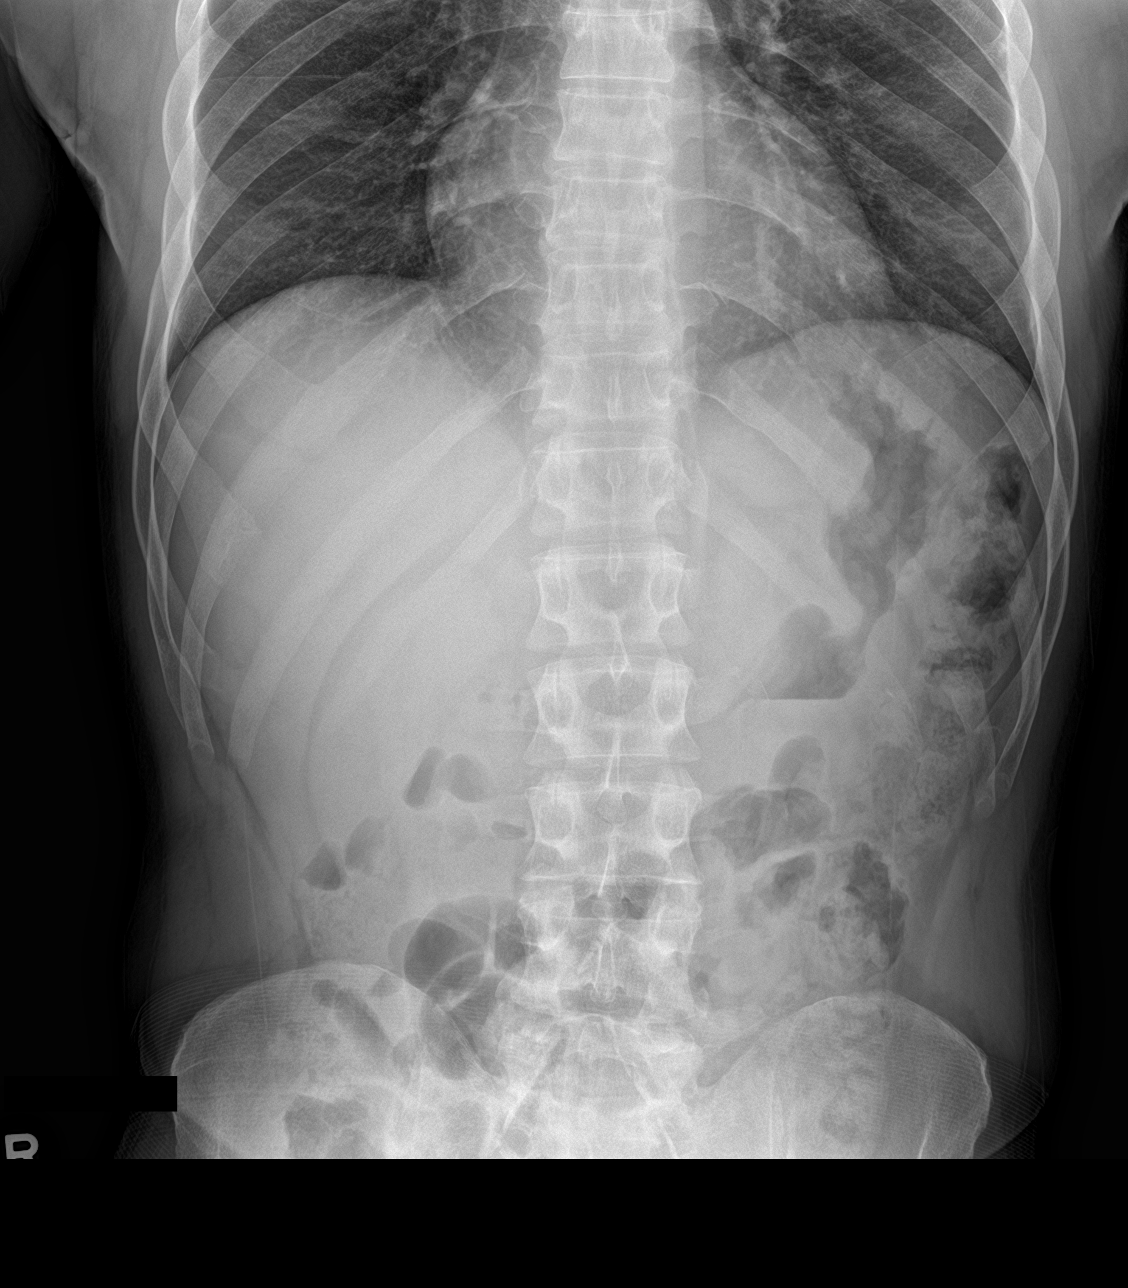

[abdomen supine]
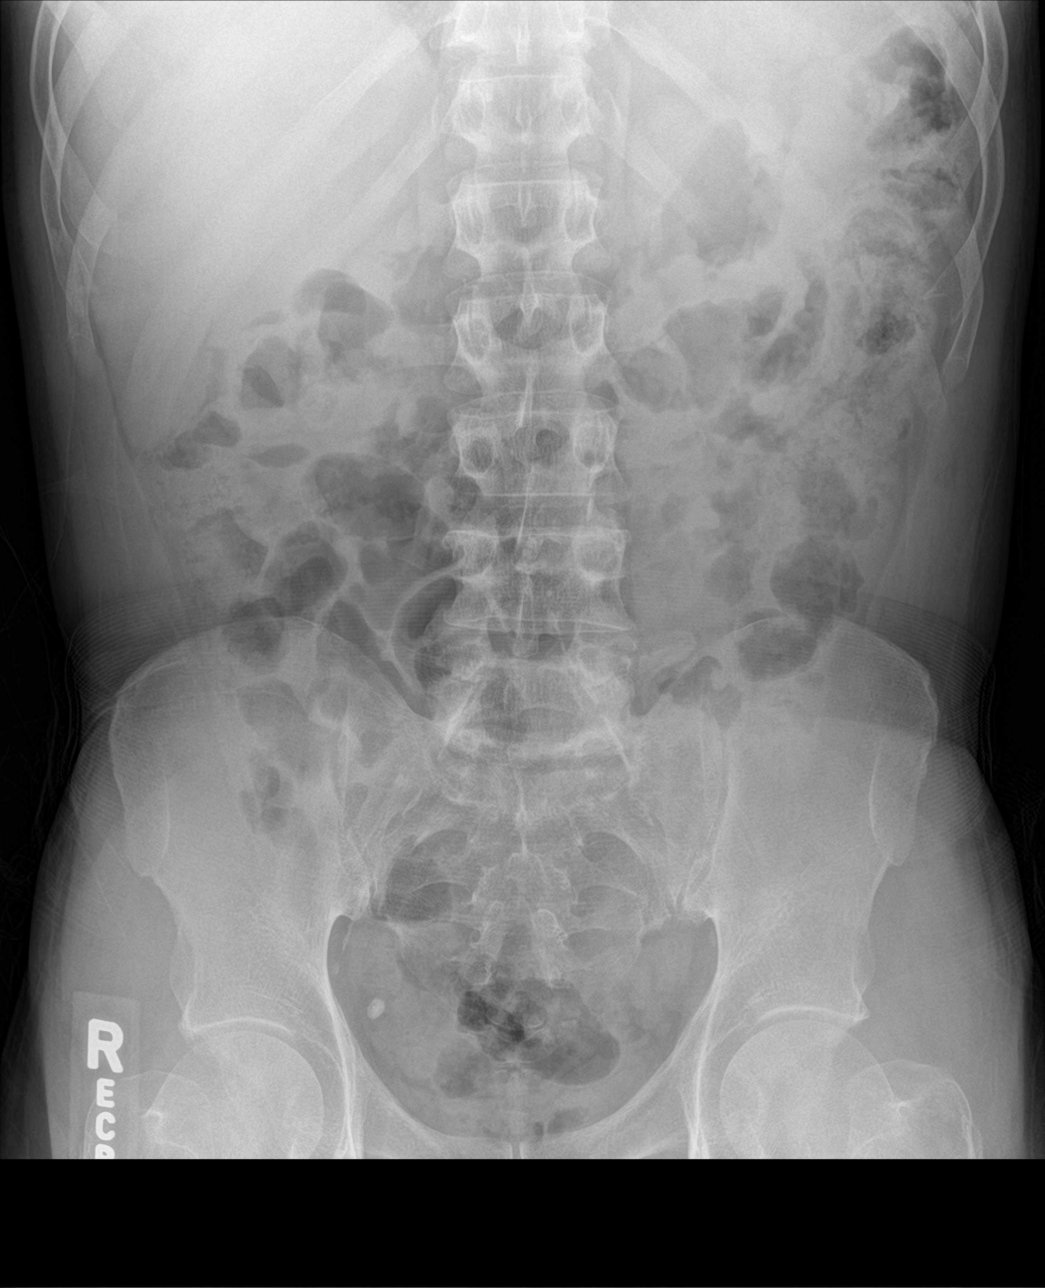

[3 of 3 positions shown; findings below may reference images not displayed]

FINDINGS: The lungs are clear. There is no pleural effusion or pneumothorax.
Cardiac silhouette is within normal limits.

No bowel dilatation or evidence of obstruction. No free air or
radiopaque calculi. The osseous structures and soft tissues appear
unremarkable.
IMPRESSION: Negative abdominal radiographs.  No acute cardiopulmonary disease.

## 2020-05-18 IMAGING — CR DG CHEST 2V
2 series · 2 of 2 positions shown · non-contrast
Comparison: None.

CLINICAL DATA: Central chest pain, LEFT lower quadrant pain

EXAM:
CHEST - 2 VIEW

[chest pa]
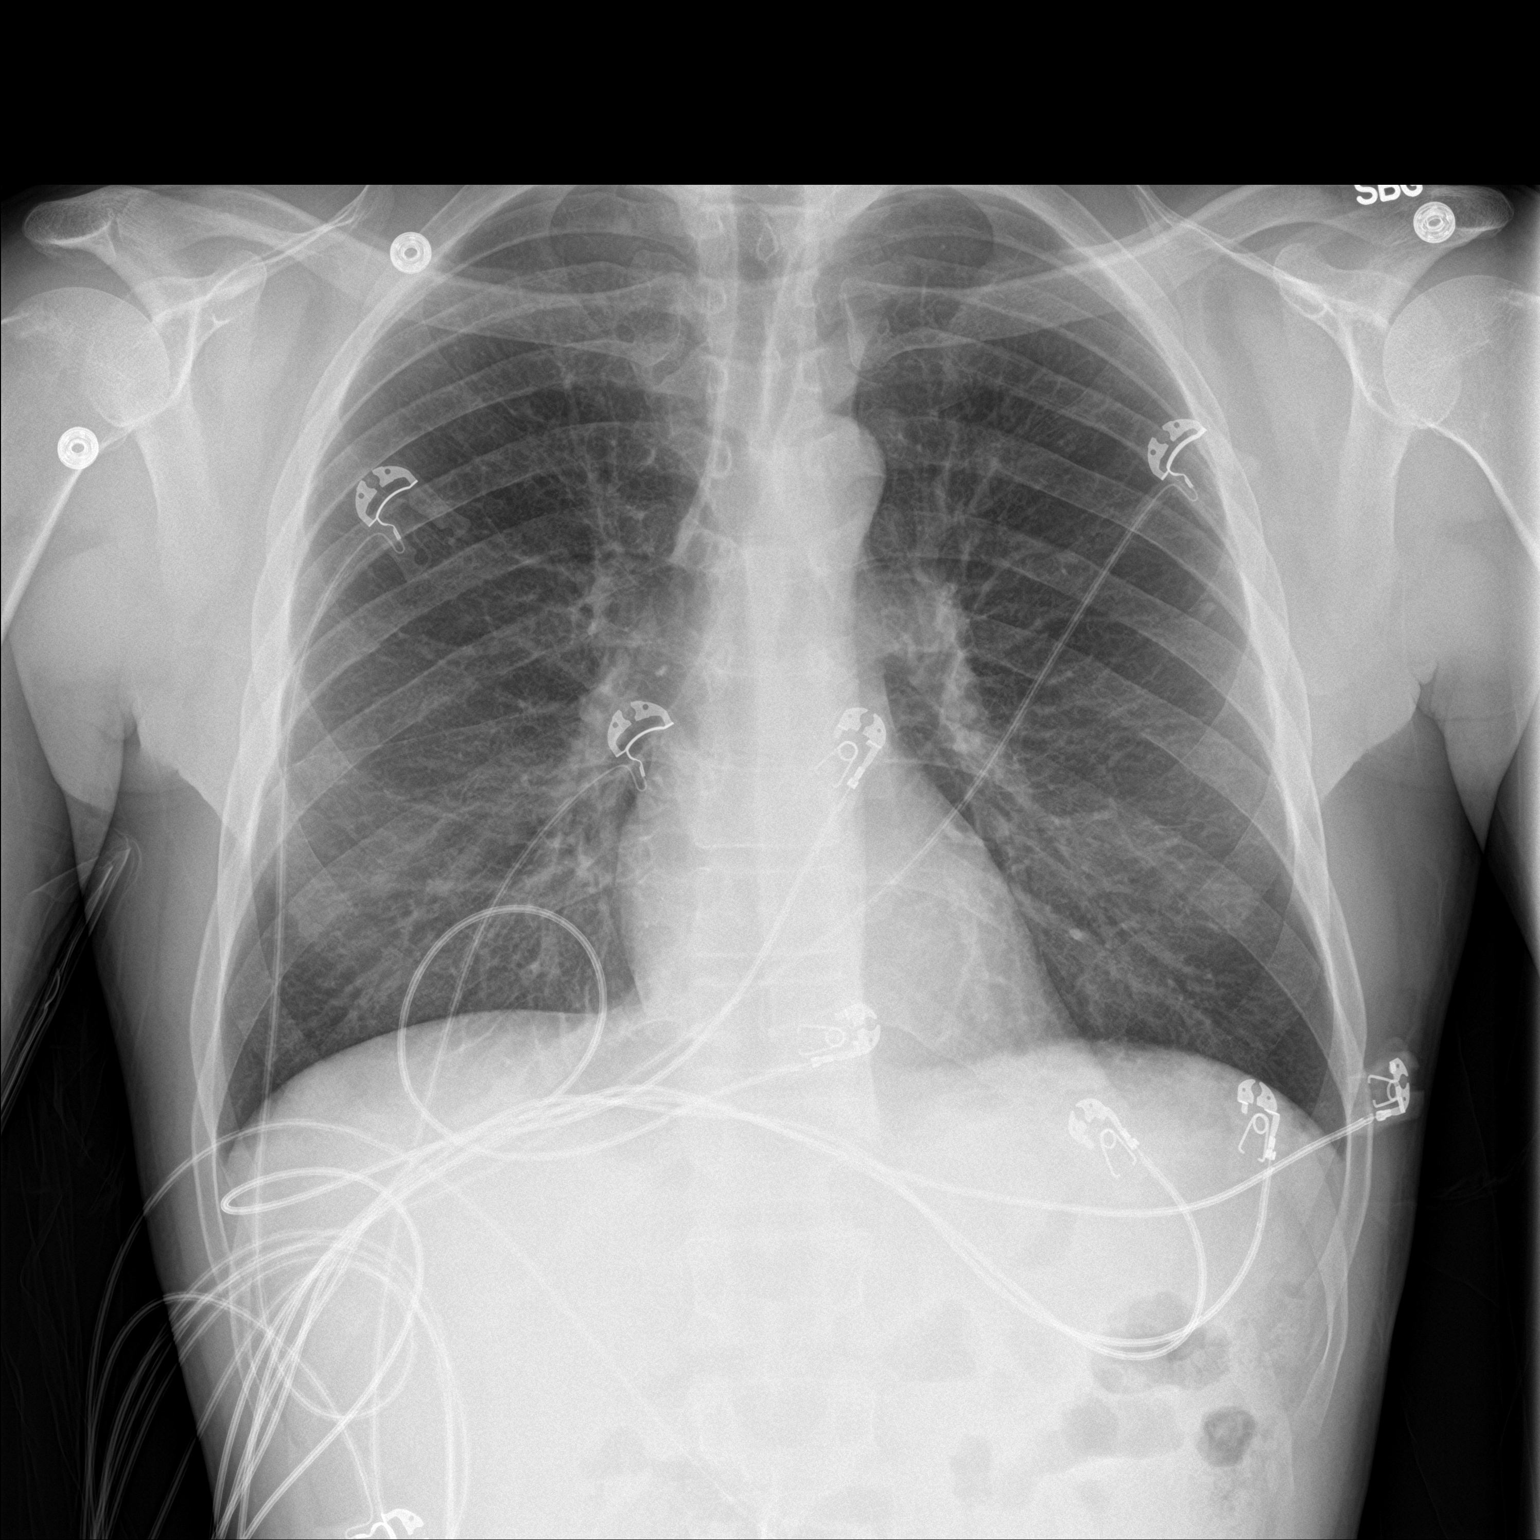

[chest lat]
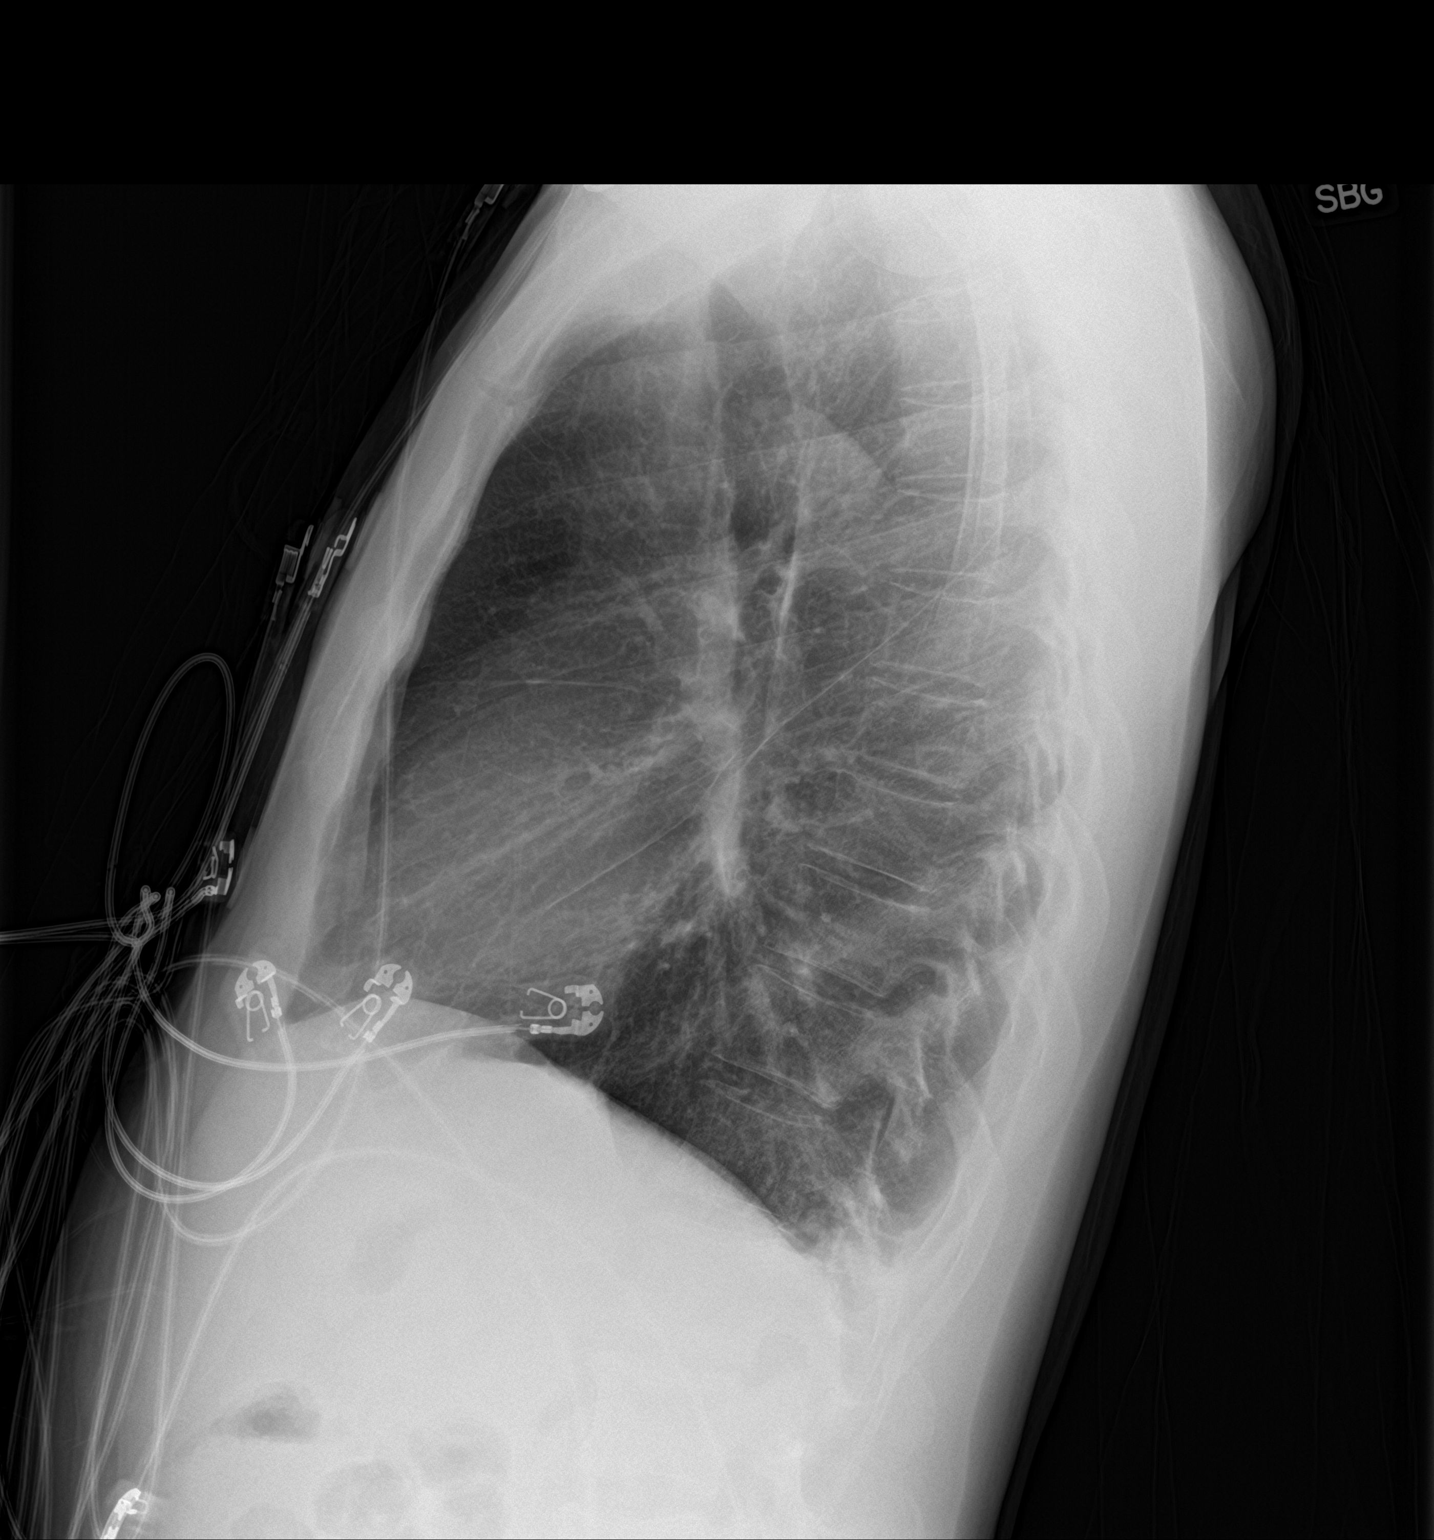

[2 of 2 positions shown; findings below may reference images not displayed]

FINDINGS: Normal mediastinum and cardiac silhouette. Normal pulmonary
vasculature. No evidence of effusion, infiltrate, or pneumothorax.
No acute bony abnormality.
IMPRESSION: Normal chest radiograph.  Is

## 2020-05-20 NOTE — Telephone Encounter (Signed)
Pt is requesting a call back from a nurse to discuss the pain management referral.

## 2020-05-20 NOTE — Telephone Encounter (Signed)
Spoke with Inez Catalina at [facility], she states that they are still waiting on credentialing for [facility].   Lm on vm for patient to return call.

## 2020-05-21 NOTE — Telephone Encounter (Signed)
Sorry to hear that he had this encounter with the pain management clinic, I am happy to refer him to wherever he wishes to see pain management although they have not been accepting referrals recently for chronic pain management of narcotics.  I had recommended a dotatate PET scan in light of his markedly elevated gastrin level, chromogranin A level and duodenal ulcers.  I do not see that he has scheduled that.  Can you please help schedule that for him. He should be on omeprazole 40 mg twice daily as well as continue Carafate if you can ensure he is taking that.  He also needs to be completely abstinent from alcohol and tobacco use, hopefully he has been doing better in that regard.  In regards to chronic narcotics, unfortunately our clinic does not prescribe that long-term.  He needs to talk with his primary care physician about how he can get narcotics prescribed if that is what he needs, which seems reasonable in his case, that might need to come from his primary care if he cannot get it from pain management.

## 2020-05-21 NOTE — Telephone Encounter (Signed)
Spoke with patient, he states that he called [facility] and states they do not have referral. Advised that we were told they would not accept the referral because [employer] insurance will be his primary insurance. Advised that I followed up with [facility] yesterday and they are still waiting on credentialing. Advised that I will continue to follow up and hopefully we will know something soon. Patient verbalized understanding and had no other concerns.

## 2020-05-21 NOTE — Telephone Encounter (Signed)
Lm on vm for patient to return call.   Message sent to [facility] to have Dototate PET scan authorized prior to scheduling.

## 2020-05-21 NOTE — Telephone Encounter (Signed)
Received a call from [facility] at [facility], she states that she spoke with the patient and told him that they denied the referral as they are not able to treat his condition, she states that she also does not thinking he would be a good fit for the practice, she states that he was very rude and accusing them of being unethical.   Patient returned call to me and stated that he was told that they denied his referral. Patient states the woman at [facility] was nasty with him, he states that he decided to end the call with her because he did not want to continue and unsavory conversation. Patient states that his PCP referred him to pain management as well and was denied. Patient would like to know what to do, he states that there is no point in going to the hospital. Patient thinks that he is being denied because his condition is related to his alcohol use, patient has several concerns and needing to know how to proceed. Patient began to get tearful on the phone because he does not know what to do. Advised that I will speak with Dr. Havery Moros and see what we will do next. Patient verbalized understanding.

## 2020-05-21 NOTE — Telephone Encounter (Signed)
Patient returned call states that he is having epigastric pain and lower left abdominal pain, he states that pain is a 6.5/10. He describes as a burning and aching pain that  can be sharp at times until he has a BM, pt states that he can only drink water, he drinks goat milk that helps for about 10-15 minutes. Patient is wanting to know if you will be able to prescribe him anything for the pain for a few days, he states that if he goes to the hospital they will only tell him to call his doctor. We are still waiting on response from [facility]. Please advise, thanks.

## 2020-05-22 ENCOUNTER — Encounter: Payer: Self-pay | Admitting: Internal Medicine

## 2020-05-22 ENCOUNTER — Other Ambulatory Visit: Payer: Self-pay

## 2020-05-22 ENCOUNTER — Ambulatory Visit: Payer: MEDICAID | Attending: Family Medicine | Admitting: Internal Medicine

## 2020-05-22 VITALS — BP 155/90 | HR 102 | Resp 20 | Wt 119.0 lb

## 2020-05-22 DIAGNOSIS — K861 Other chronic pancreatitis: Secondary | ICD-10-CM

## 2020-05-22 DIAGNOSIS — I10 Essential (primary) hypertension: Secondary | ICD-10-CM

## 2020-05-22 DIAGNOSIS — K2920 Alcoholic gastritis without bleeding: Secondary | ICD-10-CM

## 2020-05-22 DIAGNOSIS — K859 Acute pancreatitis without necrosis or infection, unspecified: Secondary | ICD-10-CM | POA: Diagnosis not present

## 2020-05-22 MED ORDER — SUCRALFATE 1 G PO TABS: 1.0000 g | ORAL_TABLET | Freq: Three times a day (TID) | ORAL | 3 refills | Status: DC

## 2020-05-22 MED ORDER — METOPROLOL TARTRATE 100 MG PO TABS: 100.0000 mg | ORAL_TABLET | Freq: Two times a day (BID) | ORAL | 3 refills | Status: DC

## 2020-05-22 NOTE — Telephone Encounter (Signed)
Spoke with patient in regards to Dr. Doyne Keel recommendations as below. Advised that we are working on authorization so we can get him scheduled for the PET scan, advised that once it has been authorized we will schedule him and let him know. Patient states that he is taking the Omeprazole 40 mg BID, pt states that he needs a refill on Carafate. Patient states that he has cut back with drinking significantly, he states that he is supposed to be trying to get in with a rehab for alcohol use, he states that he still smokes a half a pack of cigarettes a day, pt wanted to know if he could chew tobacco, advised that he needs to abstain from any alcohol or tobacco use.   In regards to management of narcotics, advised patient that we have sent several pain management referrals and they have been denied. Advised that he can reach out to his PCP and see if they will manage it or see if he can get it prescribed if that is what he needs, pt states that he has an appt today with his PCP. Advised that Dr. Doyne Keel notes are in epic if PCP would like to review them.   Answered all of patient's questions, he verbalized understanding of all information and had no concerns at the end of the call.

## 2020-05-22 NOTE — Assessment & Plan Note (Signed)
Complicated patient.  He has chronic pancreatitis.  I do not think he is having an acute exacerbation.  He certainly voices discomfort.  I am reluctant to prescribe him any additional pain medication.  He states he was taking Celebrex.  He should stop that.  I have reviewed his medications in detail and I think the current list is accurate.  I did refill his metoprolol and Carafate.  He states the Carafate helps with his discomfort and he has not taken that in several days.  I think given his initial discomfort in the office he does need additional lab work.  I will check a lipase comprehensive metabolic profile and an alcohol level.  The patient states he has follow-up with Dr. Havery Moros with [facility] next week. I have encouraged the patient to continue abstinence from alcohol.  He states that he has been without alcohol for 9 days there is no risk of alcohol withdrawal.

## 2020-05-22 NOTE — Progress Notes (Addendum)
RF on flexeril, hydrocodone,   Has a PET Scan - seeing a GI Specialist  Complicated patient with long hx of alcohol use disorder and chronic pancreatitis.  Patient has continued pain. He states pain is located in upper abdomen.  Pain seems to be exacerbated by eating. He takes PPI and H2 blocker.  Patient states he has not had anything to drink in 9 days.  He denies n/v, he is able to eat without causing more discomfort.   Past Medical History:  Diagnosis Date  . Alcoholism /alcohol abuse   . Anemia   . Anxiety   . Arthritis    "knees; arms; elbows" (03/26/2015)  . Asthma   . Bipolar disorder ([place])   . Chronic bronchitis ([place])   . Chronic lower back pain   . Chronic pancreatitis ([place])   . Cocaine abuse ([place])   . Depression   . Family history of adverse reaction to anesthesia    "grandmother gets confused"  . Femoral condyle fracture ([place]) 03/08/2014   left medial/notes 03/09/2014  . GERD (gastroesophageal reflux disease)   . H/O hiatal hernia   . H/O suicide attempt 10/2012  . High cholesterol   . History of blood transfusion 10/2012   "when I tried to commit suicide"  . History of stomach ulcers   . Hypertension   . Marijuana abuse, continuous   . Migraine    "a few times/year" (03/26/2015)  . Pneumonia 1990's X 3  . PTSD (post-traumatic stress disorder)   . Sickle cell trait ([place])   . WPW (Wolff-Parkinson-White syndrome)    [name] 03/06/2013    Social History   Socioeconomic History  . Marital status: Single    Spouse name: Not on file  . Number of children: Not on file  . Years of education: Not on file  . Highest education level: Not on file  Occupational History  . Occupation: disabled  Tobacco Use  . Smoking status: Current Every Day Smoker    Packs/day: 1.00    Years: 36.00    Pack years: 36.00    Types: Cigarettes, E-cigarettes  . Smokeless tobacco: Never Used  Vaping Use  . Vaping Use: Former  Substance and Sexual Activity  . Alcohol use: Yes     Comment: last drink 8 days ago  . Drug use: Yes    Types: Marijuana, Cocaine    Comment: daily marijuana use; last cocaine use about 3 months ago  . Sexual activity: Yes    Birth control/protection: None  Other Topics Concern  . Not on file  Social History Narrative   ** Merged History Encounter **       Social Determinants of Health   Financial Resource Strain: Not on file  Food Insecurity: Not on file  Transportation Needs: Not on file  Physical Activity: Not on file  Stress: Not on file  Social Connections: Not on file  Intimate Partner Violence: Not on file    Past Surgical History:  Procedure Laterality Date  . BIOPSY  11/25/2017   Procedure: BIOPSY;  Surgeon: Arta Silence, MD;  Location: [facility] ENDOSCOPY;  Service: Endoscopy;;  . BIOPSY  10/14/2018   Procedure: BIOPSY;  Surgeon: Arta Silence, MD;  Location: [place];  Service: Endoscopy;;  . CARDIAC CATHETERIZATION    . CYST ENTEROSTOMY  01/02/2020   Procedure: CYST ASPIRATION;  Surgeon: Milus Banister, MD;  Location: [facility] ENDOSCOPY;  Service: Endoscopy;;  . ESOPHAGOGASTRODUODENOSCOPY (EGD) WITH PROPOFOL N/A 11/25/2017   Procedure: ESOPHAGOGASTRODUODENOSCOPY (EGD)  WITH PROPOFOL;  Surgeon: Arta Silence, MD;  Location: [place];  Service: Endoscopy;  Laterality: N/A;  . ESOPHAGOGASTRODUODENOSCOPY (EGD) WITH PROPOFOL Left 10/14/2018   Procedure: ESOPHAGOGASTRODUODENOSCOPY (EGD) WITH PROPOFOL;  Surgeon: Arta Silence, MD;  Location[facility] ENDOSCOPY;  Service: Endoscopy;  Laterality: Left;  . ESOPHAGOGASTRODUODENOSCOPY (EGD) WITH PROPOFOL N/A 11/14/2018   Procedure: ESOPHAGOGASTRODUODENOSCOPY (EGD) WITH PROPOFOL;  Surgeon: Laurence Spates, MD;  Location: [facility] ENDOSCOPY;  Service: Gastroenterology;  Laterality: N/A;  . ESOPHAGOGASTRODUODENOSCOPY (EGD) WITH PROPOFOL N/A 01/02/2020   Procedure: ESOPHAGOGASTRODUODENOSCOPY (EGD) WITH PROPOFOL;  Surgeon: Milus Banister, MD;  Location: [facility] ENDOSCOPY;  Service: Endoscopy;   Laterality: N/A;  . EUS N/A 01/02/2020   Procedure: UPPER ENDOSCOPIC ULTRASOUND (EUS) RADIAL;  Surgeon: Milus Banister, MD;  Location: [facility] ENDOSCOPY;  Service: Endoscopy;  Laterality: N/A;  . EYE SURGERY Left 1990's   "result of trauma"   . FACIAL FRACTURE SURGERY Left 1990's   "result of trauma"   . FRACTURE SURGERY    . HERNIA REPAIR    . LEFT HEART CATHETERIZATION WITH CORONARY ANGIOGRAM Right 03/07/2013   Procedure: LEFT HEART CATHETERIZATION WITH CORONARY ANGIOGRAM;  Surgeon: [name], MD;  Location: [place] CATH LAB;  Service: Cardiovascular;  Laterality: Right;  . UMBILICAL HERNIA REPAIR    . UPPER GASTROINTESTINAL ENDOSCOPY      Family History  Problem Relation Age of Onset  . Hypertension Mother   . Cirrhosis Mother   . Alcoholism Mother   . Hypertension Father   . Melanoma Father   . Hypertension Other   . Coronary artery disease Other     Allergies  Allergen Reactions  . Robaxin [Methocarbamol] Other (See Comments)    "jumpy limbs"  . Shellfish-Derived Products Nausea And Vomiting  . Trazodone And Nefazodone Other (See Comments)    Muscle spasms  . Adhesive [Tape] Itching  . Latex Itching  . Toradol [Ketorolac Tromethamine] Other (See Comments)    Has ulcers; cannot have this  . Contrast Media [Iodinated Diagnostic Agents] Hives  . Reglan [Metoclopramide] Other (See Comments)    Muscle spasms    Current Outpatient Medications on File Prior to Visit  Medication Sig Dispense Refill  . albuterol (VENTOLIN HFA) 108 (90 Base) MCG/ACT inhaler Inhale 2 puffs into the lungs every 4 (four) hours as needed for wheezing or shortness of breath. 18 g 11  . fluticasone furoate-vilanterol (BREO ELLIPTA) 200-25 MCG/INH AEPB Inhale 1 puff into the lungs daily. 1 each 11  . folic acid (FOLVITE) 1 MG tablet Take 1 tablet (1 mg total) by mouth daily. 30 tablet 0  . hydrALAZINE (APRESOLINE) 25 MG tablet Take 1 tablet (25 mg total) by mouth 3 (three) times daily. 90 tablet 3  .  lipase/protease/amylase (CREON) 36000 UNITS CPEP capsule Take 2 capsules (72,000 Units total) by mouth 3 (three) times daily with meals. For pancreatitis (Patient taking differently: Take 72,000 Units by mouth 3 (three) times daily with meals.) 180 capsule 5  . loperamide (IMODIUM A-D) 2 MG tablet Take 2 mg by mouth 4 (four) times daily as needed for diarrhea or loose stools.     . Multiple Vitamin (MULTIVITAMIN WITH MINERALS) TABS tablet Take 1 tablet by mouth daily. 30 tablet 0  . NASONEX 50 MCG/ACT nasal spray Place 2 sprays into the nose daily as needed (allergies).     [name] omeprazole (PRILOSEC) 40 MG capsule Take once to twice daily as needed 180 capsule 1  . QUEtiapine (SEROQUEL) 50 MG tablet Take 1 tablet (50  mg total) by mouth at bedtime. 30 tablet 3  . sertraline (ZOLOFT) 100 MG tablet Take 1 tablet (100 mg total) by mouth daily. 30 tablet 3  . thiamine 100 MG tablet Take 1 tablet (100 mg total) by mouth daily. 30 tablet 0  . acetaminophen (TYLENOL) 500 MG tablet Take 2 tablets (1,000 mg total) by mouth every 8 (eight) hours as needed for mild pain. 30 tablet 0  . Cyanocobalamin (VITAMIN B-12 PO) Take 1 tablet by mouth daily.    . [DISCONTINUED] amitriptyline (ELAVIL) 25 MG tablet Take 1 tablet (25 mg total) by mouth at bedtime. (Patient not taking: Reported on 08/08/2019) 30 tablet 0  . [DISCONTINUED] gabapentin (NEURONTIN) 100 MG capsule Take 1 capsule (100 mg total) by mouth 2 (two) times daily. (Patient not taking: Reported on 08/08/2019) 30 capsule 0  . [DISCONTINUED] pantoprazole (PROTONIX) 40 MG tablet Take 1 tablet (40 mg total) by mouth 2 (two) times daily. (Patient not taking: Reported on 11/17/2019) 60 tablet 3  . [DISCONTINUED] promethazine (PHENERGAN) 25 MG tablet Take 1 tablet (25 mg total) by mouth every 6 (six) hours as needed for nausea or vomiting. (Patient not taking: Reported on 08/08/2019) 30 tablet 0   No current facility-administered medications on file prior to visit.      patient denies chest pain, shortness of breath, orthopnea. Denies lower extremity edema, abdominal pain, change in appetite, change in bowel movements. Patient denies rashes, musculoskeletal complaints. No other specific complaints in a complete review of systems.   BP (!) 155/90   Pulse (!) 102   Resp 20   Wt 119 lb (54 kg)   SpO2 100%   BMI 18.09 kg/m   thin male in no acute distress, appears chronically ill. Initially patient is doubled over in pain. As I talk with him he is able to sit up and then walk without difficulty. He speaks in full sentences, sometimes speech is pressured and tangential. . HEENT exam atraumatic, normocephalic, neck supple without jugular venous distention. Chest clear to auscultation cardiac exam S1-S2 are regular. Abdominal exam overweight with bowel sounds, soft and tender to deep palpation. No voluntary or involuntary guarding. . Extremities no edema. Neurologic exam is alert with a normal gait.   Acute on chronic pancreatitis ([place]) Complicated patient.  He has chronic pancreatitis.  I do not think he is having an acute exacerbation.  He certainly voices discomfort.  I am reluctant to prescribe him any additional pain medication.  He states he was taking Celebrex.  He should stop that.  I have reviewed his medications in detail and I think the current list is accurate.  I did refill his metoprolol and Carafate.  He states the Carafate helps with his discomfort and he has not taken that in several days.  I think given his initial discomfort in the office he does need additional lab work.  I will check a lipase comprehensive metabolic profile and an alcohol level.  The patient states he has follow-up with Dr. Havery Moros with [facility] next week. I have encouraged the patient to continue abstinence from alcohol.  He states that he has been without alcohol for 9 days there is no risk of alcohol withdrawal.   At the end of the visit patient was sent for labs. He was  reading the newspaper and appeared quite comfortable. At this time I do not think that prescribing pain medications are in his best interest. This is a very difficult situation

## 2020-05-22 NOTE — Telephone Encounter (Signed)
Patient called back, he states that he is at his PCP's office and that Dr. Leanne Chang sent a message to Dr. Havery Moros in regards to his pain medication. Advised that Dr. Havery Moros is currently performing procedures but will get back with Dr. Leanne Chang as soon as he can. Patient states that he is in a lot of pain but will wait to hear back. Patient had no other concerns at the end of the call.

## 2020-05-22 NOTE — Telephone Encounter (Addendum)
Patient called back and states that he reached out to [facility] center and they stated that they would be able to accept him but will need a referral. Advised that I will send all information to [facility] center on [address], advised to give them at least a week to call him to schedule. Patient verbalized understanding of all information, he also thanked me for my patience and assistance, he had no other concerns at the end of the call.             Referral, records, demographics, and insurance information faxed to [facility] center on [address].

## 2020-05-23 ENCOUNTER — Other Ambulatory Visit (INDEPENDENT_AMBULATORY_CARE_PROVIDER_SITE_OTHER): Payer: Self-pay | Admitting: Primary Care

## 2020-05-23 ENCOUNTER — Telehealth: Payer: Self-pay | Admitting: Nurse Practitioner

## 2020-05-23 DIAGNOSIS — K2920 Alcoholic gastritis without bleeding: Secondary | ICD-10-CM

## 2020-05-23 DIAGNOSIS — I10 Essential (primary) hypertension: Secondary | ICD-10-CM

## 2020-05-23 MED ORDER — SUCRALFATE 1 G PO TABS: 1.0000 g | ORAL_TABLET | Freq: Three times a day (TID) | ORAL | 3 refills | Status: DC

## 2020-05-23 MED ORDER — METOPROLOL TARTRATE 100 MG PO TABS: 100.0000 mg | ORAL_TABLET | Freq: Two times a day (BID) | ORAL | 3 refills | Status: DC

## 2020-05-23 NOTE — Telephone Encounter (Signed)
Patient called our on-call service stating he has persistent chronic upper and lower abdominal pain due to having chronic pancreatitis, history of alcohol abuse.  No alcohol for the past week.  He is requesting pain medication.  He contacted our office yesterday and a phone message was sent to Dr. Havery Moros, the patient is requesting pain medication and his PCP Dr. Leanne Chang wanted Dr. Havery Moros to verify which type of pain medication the patient may have for his chronic abdominal pain.  Apparently, Dr. Havery Moros and Dr. Leanne Chang did not communicate yesterday.  The patient is wanting oxycodone for his chronic abdominal pain.  I advised the patient to go to the ED if he was having severe abdominal pain.  I communicated with Dr. Ardis Hughs on-call GI for our service and patient was advised to contact his PCP for pain management.  No specific pain medication contraindication per Dr. Ardis Hughs.

## 2020-05-23 NOTE — Progress Notes (Signed)
Mr. [name] called with abdominal pain radiating to his chest. Seen Dr. Judd Gaudier  On yesterday reviewed note no pain medication needed. Patient said he the pharmacy did not receive medication sent in (metoprolol and Carafate) per chart sent. Resent medication today. Patient wanted to know when he needed to go to the hospital. Asked is there anything different than his visit yesterday no - pain same. The ER is full for critical patients if he feels his pain is worst and would like to sit until seen it was up to him. At this time he does not need to be seen urgently but keep GI appointment schedule.  Labs reviewed nothing outstanding

## 2020-05-25 ENCOUNTER — Telehealth: Payer: Self-pay

## 2020-05-25 MED ORDER — LIDOCAINE VISCOUS HCL 2 % MT SOLN: 15.0000 mL | Freq: Four times a day (QID) | OROMUCOSAL | 0 refills | Status: DC | PRN

## 2020-05-25 NOTE — Telephone Encounter (Signed)
Copied from [place] [id number]. Topic: General - Inquiry >> May 22, 2020  4:43 PM Gillis Ends D wrote: Reason for CRM: Patient called to ask if Dr.Swords could also send in the Lidocaine viscous that he asked for at his appointment. He can be reached at [phone]. Please advise  Patient saw Swords and last visit prior to that was Dr. Margarita Rana. Please advise.

## 2020-05-25 NOTE — Addendum Note (Signed)
Addended byCharlott Rakes on: 05/25/2020 05:13 PM   Modules accepted: Orders

## 2020-05-25 NOTE — Telephone Encounter (Signed)
Done

## 2020-05-25 NOTE — Telephone Encounter (Signed)
Will route to Dr.Newlin for review.

## 2020-05-26 LAB — COMPREHENSIVE METABOLIC PANEL
ALT: 64 IU/L — ABNORMAL HIGH (ref 0–44)
AST: 73 IU/L — ABNORMAL HIGH (ref 0–40)
Albumin/Globulin Ratio: 1.7 (ref 1.2–2.2)
Albumin: 4.7 g/dL (ref 4.0–5.0)
Alkaline Phosphatase: 140 IU/L — ABNORMAL HIGH (ref 44–121)
BUN/Creatinine Ratio: 20 (ref 9–20)
BUN: 14 mg/dL (ref 6–24)
Bilirubin Total: 0.3 mg/dL (ref 0.0–1.2)
CO2: 20 mmol/L (ref 20–29)
Calcium: 9.7 mg/dL (ref 8.7–10.2)
Chloride: 103 mmol/L (ref 96–106)
Creatinine, Ser: 0.71 mg/dL — ABNORMAL LOW (ref 0.76–1.27)
GFR calc Af Amer: 126 mL/min/{1.73_m2} (ref >59)
GFR calc non Af Amer: 109 mL/min/{1.73_m2} (ref >59)
Globulin, Total: 2.8 g/dL (ref 1.5–4.5)
Glucose: 86 mg/dL (ref 65–99)
Potassium: 4.5 mmol/L (ref 3.5–5.2)
Sodium: 138 mmol/L (ref 134–144)
Total Protein: 7.5 g/dL (ref 6.0–8.5)

## 2020-05-26 LAB — CBC WITH DIFFERENTIAL/PLATELET
Basophils Absolute: 0 10*3/uL (ref 0.0–0.2)
Basos: 0 %
EOS (ABSOLUTE): 0.1 10*3/uL (ref 0.0–0.4)
Eos: 1 %
Hematocrit: 36.5 % — ABNORMAL LOW (ref 37.5–51.0)
Hemoglobin: 11.9 g/dL — ABNORMAL LOW (ref 13.0–17.7)
Immature Grans (Abs): 0 10*3/uL (ref 0.0–0.1)
Immature Granulocytes: 0 %
Lymphocytes Absolute: 2.4 10*3/uL (ref 0.7–3.1)
Lymphs: 39 %
MCH: 32.4 pg (ref 26.6–33.0)
MCHC: 32.6 g/dL (ref 31.5–35.7)
MCV: 100 fL — ABNORMAL HIGH (ref 79–97)
Monocytes Absolute: 0.8 10*3/uL (ref 0.1–0.9)
Monocytes: 14 %
Neutrophils Absolute: 2.7 10*3/uL (ref 1.4–7.0)
Neutrophils: 46 %
Platelets: 212 10*3/uL (ref 150–450)
RBC: 3.67 x10E6/uL — ABNORMAL LOW (ref 4.14–5.80)
RDW: 13.1 % (ref 11.6–15.4)
WBC: 6 10*3/uL (ref 3.4–10.8)

## 2020-05-26 LAB — ETHANOL

## 2020-05-26 LAB — LIPASE: Lipase: 43 U/L (ref 13–78)

## 2020-05-26 NOTE — Telephone Encounter (Signed)
Call placed to pt and message states call can not be completed at this time.

## 2020-05-28 ENCOUNTER — Telehealth: Payer: Self-pay

## 2020-05-28 NOTE — Telephone Encounter (Signed)
Dr. Havery Moros, I received this message from [facility] who assists with authorizations. Please see below. Thank you

## 2020-05-29 NOTE — Telephone Encounter (Signed)
Hi [name], I called this number, discussed this case with another physician, who approved the Dotatate PET scan. To be done at [place].  January 14-Feb 12th authorization Authorization number - [id number]  Can you help with scheduling this for him, if it is not already? He should be okay to proceed and have it done. Thanks

## 2020-05-29 NOTE — Telephone Encounter (Signed)
Lm on vm for [name] in Nuclear Medicine to return call so that I can schedule Dototate scan for patient. I left patient's information and authorization number for insurance on her voicemail. Advised her to give me a call back with any questions, I provided her with my direct number.

## 2020-06-01 NOTE — Telephone Encounter (Signed)
Lm on vm for patient to return call to see if he would like to proceed with PET scan.

## 2020-06-02 NOTE — Telephone Encounter (Signed)
Lm on vm for patient to return call 

## 2020-06-04 ENCOUNTER — Telehealth: Payer: Self-pay | Admitting: Gastroenterology

## 2020-06-04 NOTE — Telephone Encounter (Signed)
Mechele Claude, Cory Roughen A 1 hour ago (8:38 AM)    Pt is requesting a call back from a nurse to discuss his referral to [facility].      Documentation    Al[place], [place] [phone]  Mechele Claude, Cory Roughen A 1 hour ago (8:38 AM)

## 2020-06-04 NOTE — Telephone Encounter (Signed)
Lm on vm for patient to return call. See telephone encounter 05/28/20 for more information

## 2020-06-04 NOTE — Telephone Encounter (Signed)
Noted, information was added to previous telephone encounter from 05/28/20.

## 2020-06-04 NOTE — Telephone Encounter (Signed)
Left message for [name], referral coordinator at [facility] on [address] to give me a call back in regards to pain management referral that was faxed about 2 weeks ago.   Left detailed message on patient's vm letting him know that I am waiting on a return call from the referral coordinator at [facility]. I also advised that Dr. Havery Moros is still recommending the Dototate PET scan, advised that this has been approved by his insurance until 06/20/20 and he would need to let me know if he would like to proceed.

## 2020-06-04 NOTE — Telephone Encounter (Signed)
[phone]  [name] Y 13 minutes ago (1:08 PM)    FYI pt scheduled for 06/08/20 at 11:45am with Dr. Emelia Loron.. thanks   Incoming call

## 2020-06-04 NOTE — Telephone Encounter (Signed)
Pt is requesting a call back from a nurse to discuss his referral to [facility].

## 2020-06-05 NOTE — Telephone Encounter (Signed)
Left detailed message on mobile phone letting patient know that he has been scheduled at [facility] center for pain management on 06/08/20 at 11:45 am with Dr. Emelia Loron. Advised him to give me a call back in regards to PET scan if he would like to proceed.

## 2020-06-08 ENCOUNTER — Telehealth: Payer: Self-pay | Admitting: Gastroenterology

## 2020-06-08 NOTE — Telephone Encounter (Addendum)
Spoke with Raquel at [facility], she states that they have not received the office notes and are requesting that they be re-faxed to [phone]. Advised that I faxed it on 05/22/20 and earlier this morning to a fax number that was provided to me [phone]). She states that if they need any more information they will give [place] a call, she had no further concerns at the end of the call.  Records re-faxed to [facility] at new number with attention to Dr. Emelia Loron

## 2020-06-08 NOTE — Telephone Encounter (Signed)
Spoke with patient, see 05/28/20 telephone encounter.

## 2020-06-08 NOTE — Telephone Encounter (Signed)
Thank you :)

## 2020-06-08 NOTE — Telephone Encounter (Signed)
Pls call pt. He would like to speak with you about his PET scan and also wants to know if referral was sent to [facility] as he has an appt this morning. Pls call him at. [phone].

## 2020-06-08 NOTE — Telephone Encounter (Signed)
Spoke with patient, he states that he was seen today but the provider said that he didn't have enough information, pt states that he is supposed to return to [facility] center on 06/19/20.   [facility] at [address], I was told that the message will be sent to the providers nurse and I will receive a call back, the operator was unable to provide me with the nurses name as she "does not work at that facility". Will await return call.

## 2020-06-08 NOTE — Telephone Encounter (Signed)
Inbound call from patient requesting a call back from the nurse please in regards to [facility].  Can be reached at [phone].

## 2020-06-08 NOTE — Telephone Encounter (Signed)
[name], [name] 21 minutes ago (8:14 AM)    Pls call pt. He would like to speak with you about his PET scan and also wants to know if referral was sent to [facility] as he has an appt this morning. Pls call him at. [phone].     Spoke with patient, he is aware of appointment with [facility] center today. Patient states that he would like to proceed with PET scan at this time. Patient has been scheduled for Dototate PET scan at [facility] (not done at [facility]) on Wednesday, 06/17/20 at 3 PM, patient will need to arrive at 2:30 PM. NPO 4 hours prior.   Spoke with patient again, he is aware of Dototate PET scan appointment. Patient verbalized understanding of all information and had no concerns at the end of the call.

## 2020-06-11 ENCOUNTER — Ambulatory Visit: Payer: Medicaid Other | Admitting: Pharmacist

## 2020-06-11 NOTE — Telephone Encounter (Signed)
Patient called in today, wanted to make sure [place] had his records. Advised that everything had been re-faxed earlier this week, he states that he was thinking about not proceeding with the PET scan but will do it. Reviewed appt information for PET scan again. Patient verbalized understanding and had no concerns at the end of the call.

## 2020-06-17 ENCOUNTER — Ambulatory Visit (HOSPITAL_COMMUNITY): Admission: RE | Admit: 2020-06-17 | Payer: 59 | Source: Ambulatory Visit

## 2020-06-30 IMAGING — DX DG CHEST 2V
2 series · 2 of 2 positions shown · non-contrast
Comparison: Chest radiograph dated 11/26/2017

CLINICAL DATA: 39-year-old male with chest pain.

EXAM:
CHEST - 2 VIEW

[chest pa]
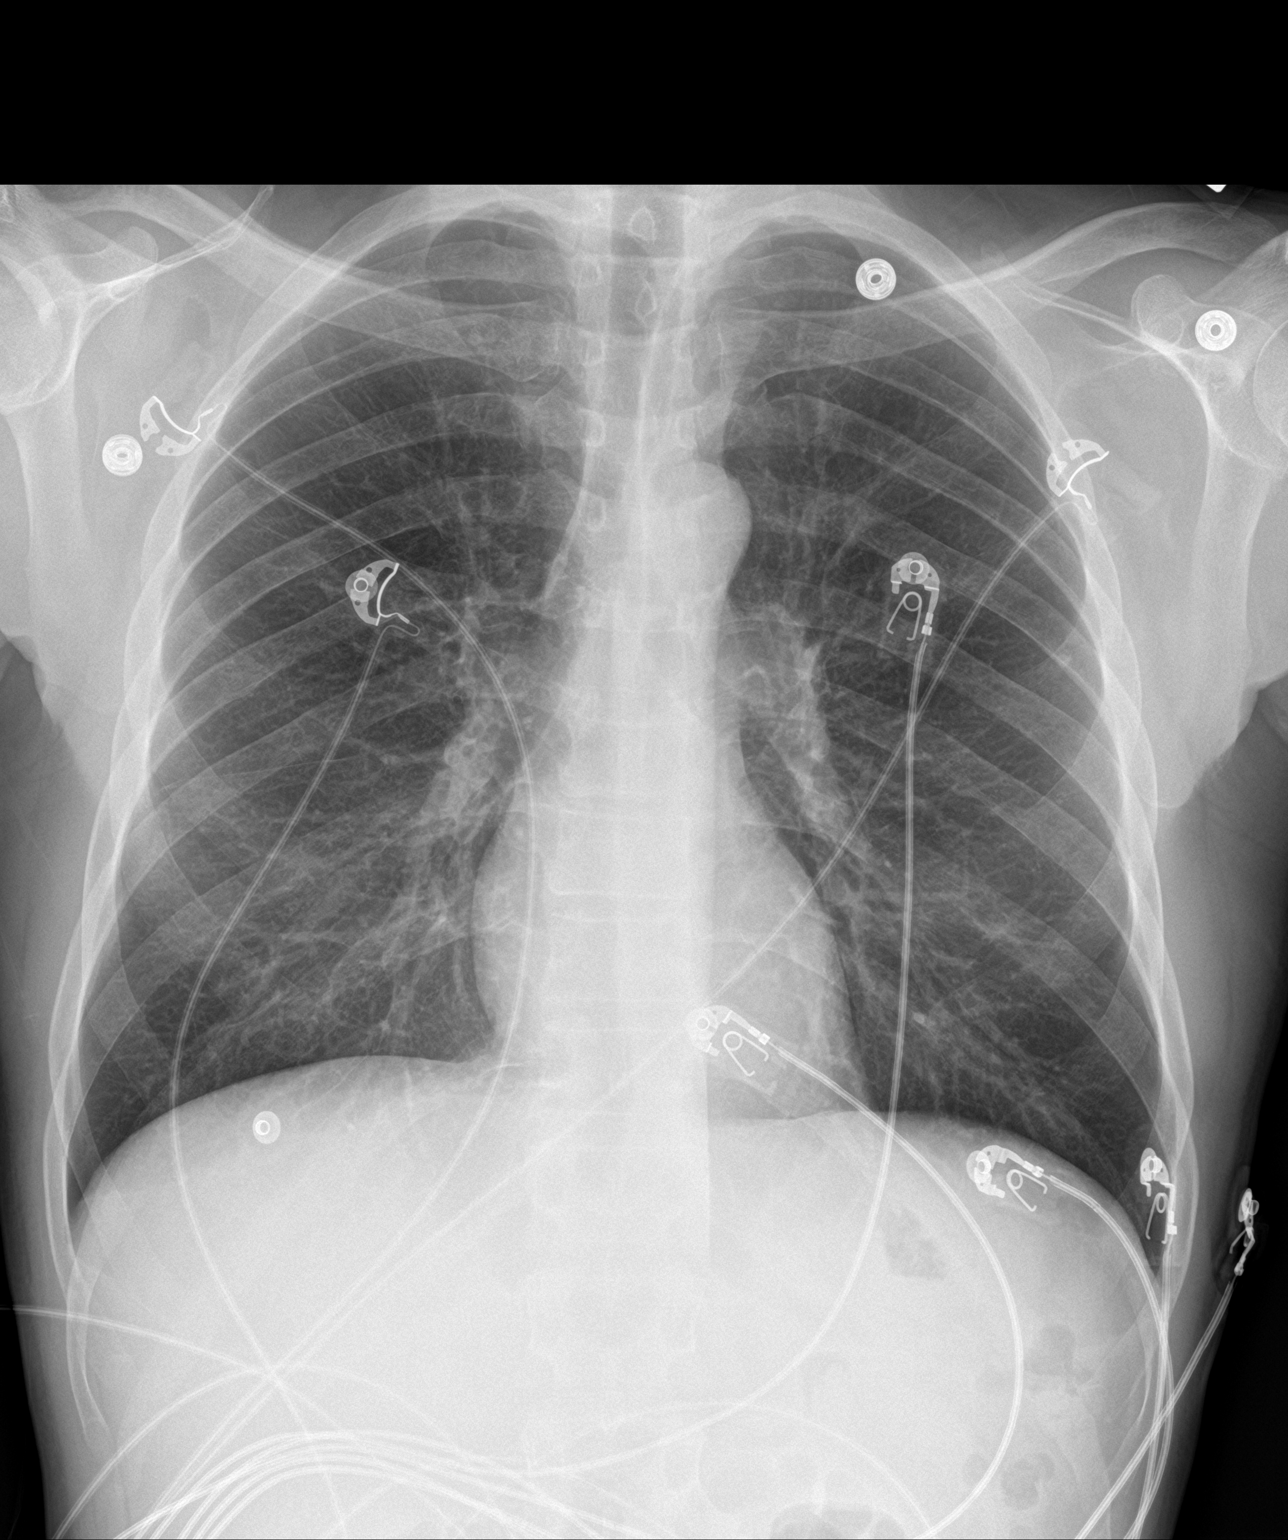

[chest lat]
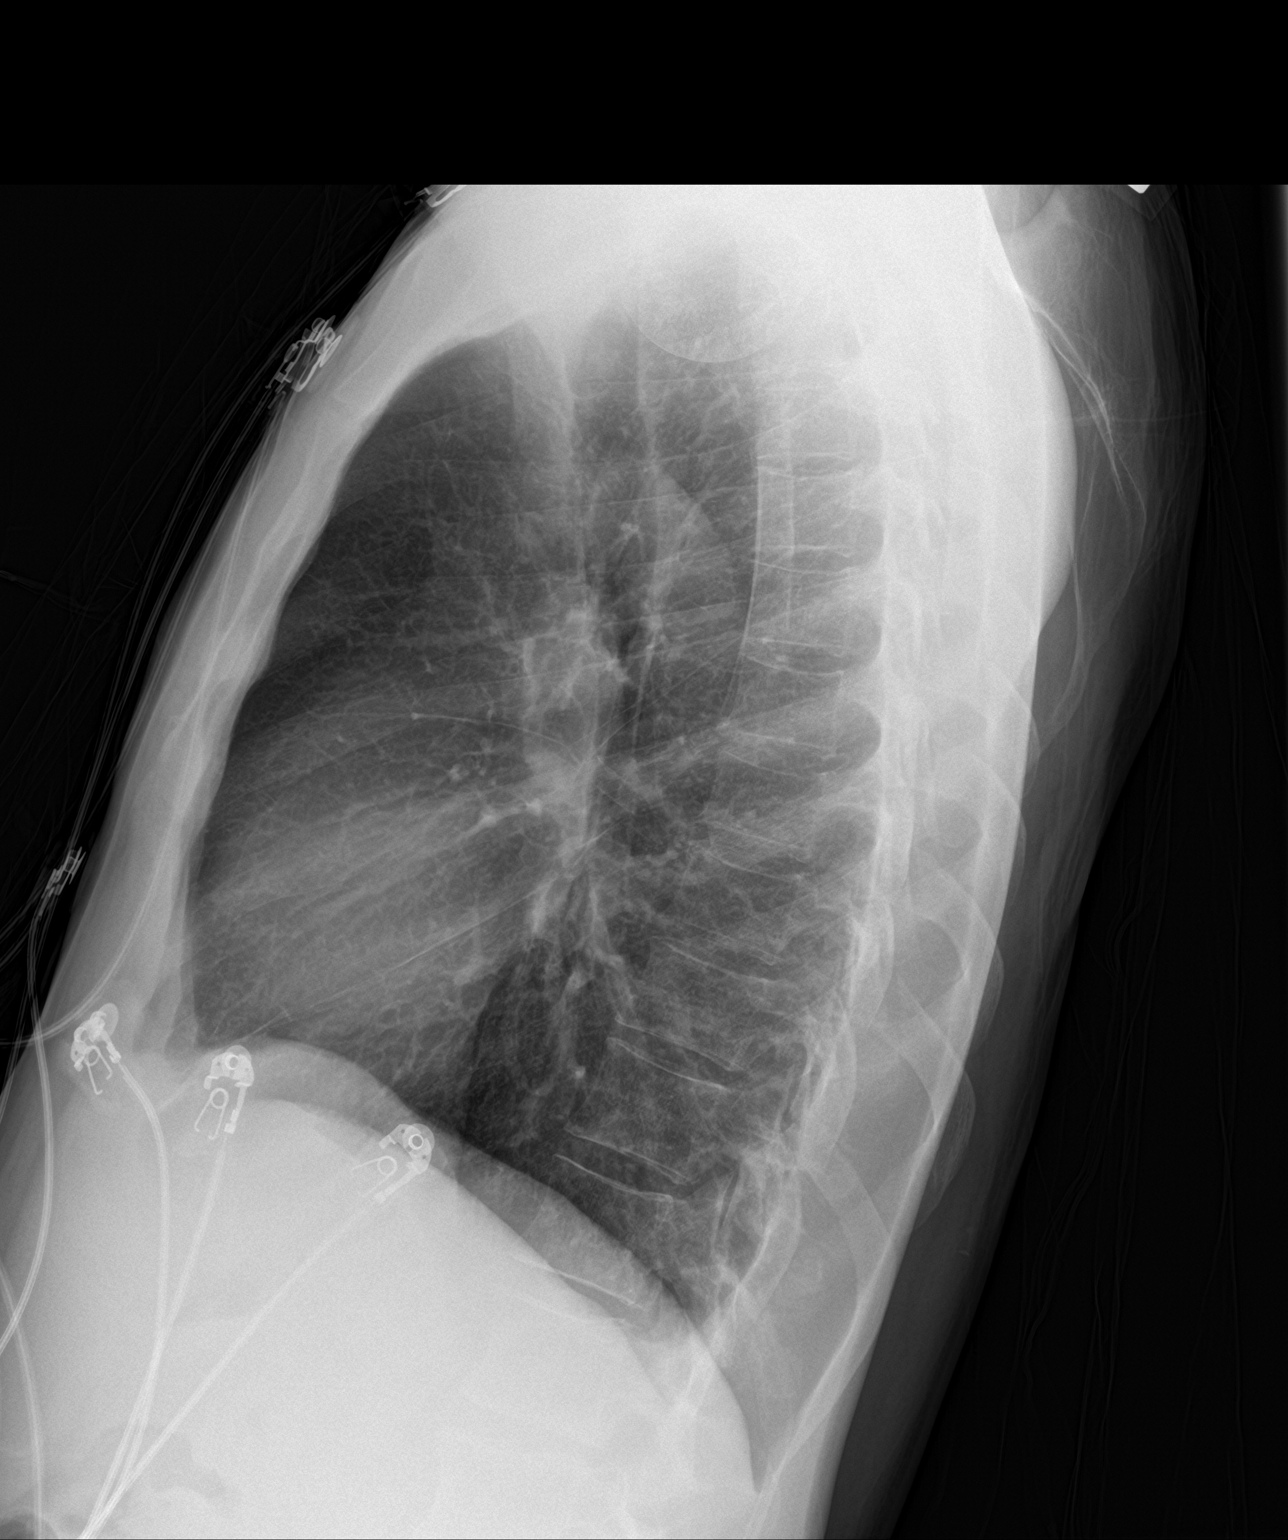

[2 of 2 positions shown; findings below may reference images not displayed]

FINDINGS: The heart size and mediastinal contours are within normal limits.
Both lungs are clear. The visualized skeletal structures are
unremarkable.
IMPRESSION: No active cardiopulmonary disease.

## 2020-07-30 IMAGING — DX DG CHEST 2V
2 series · 2 of 2 positions shown · non-contrast
Comparison: Chest x-ray dated 01/08/2018

CLINICAL DATA: Epigastric pain, shortness of breath, nausea
vomiting today

EXAM:
CHEST - 2 VIEW

[w chest pa]
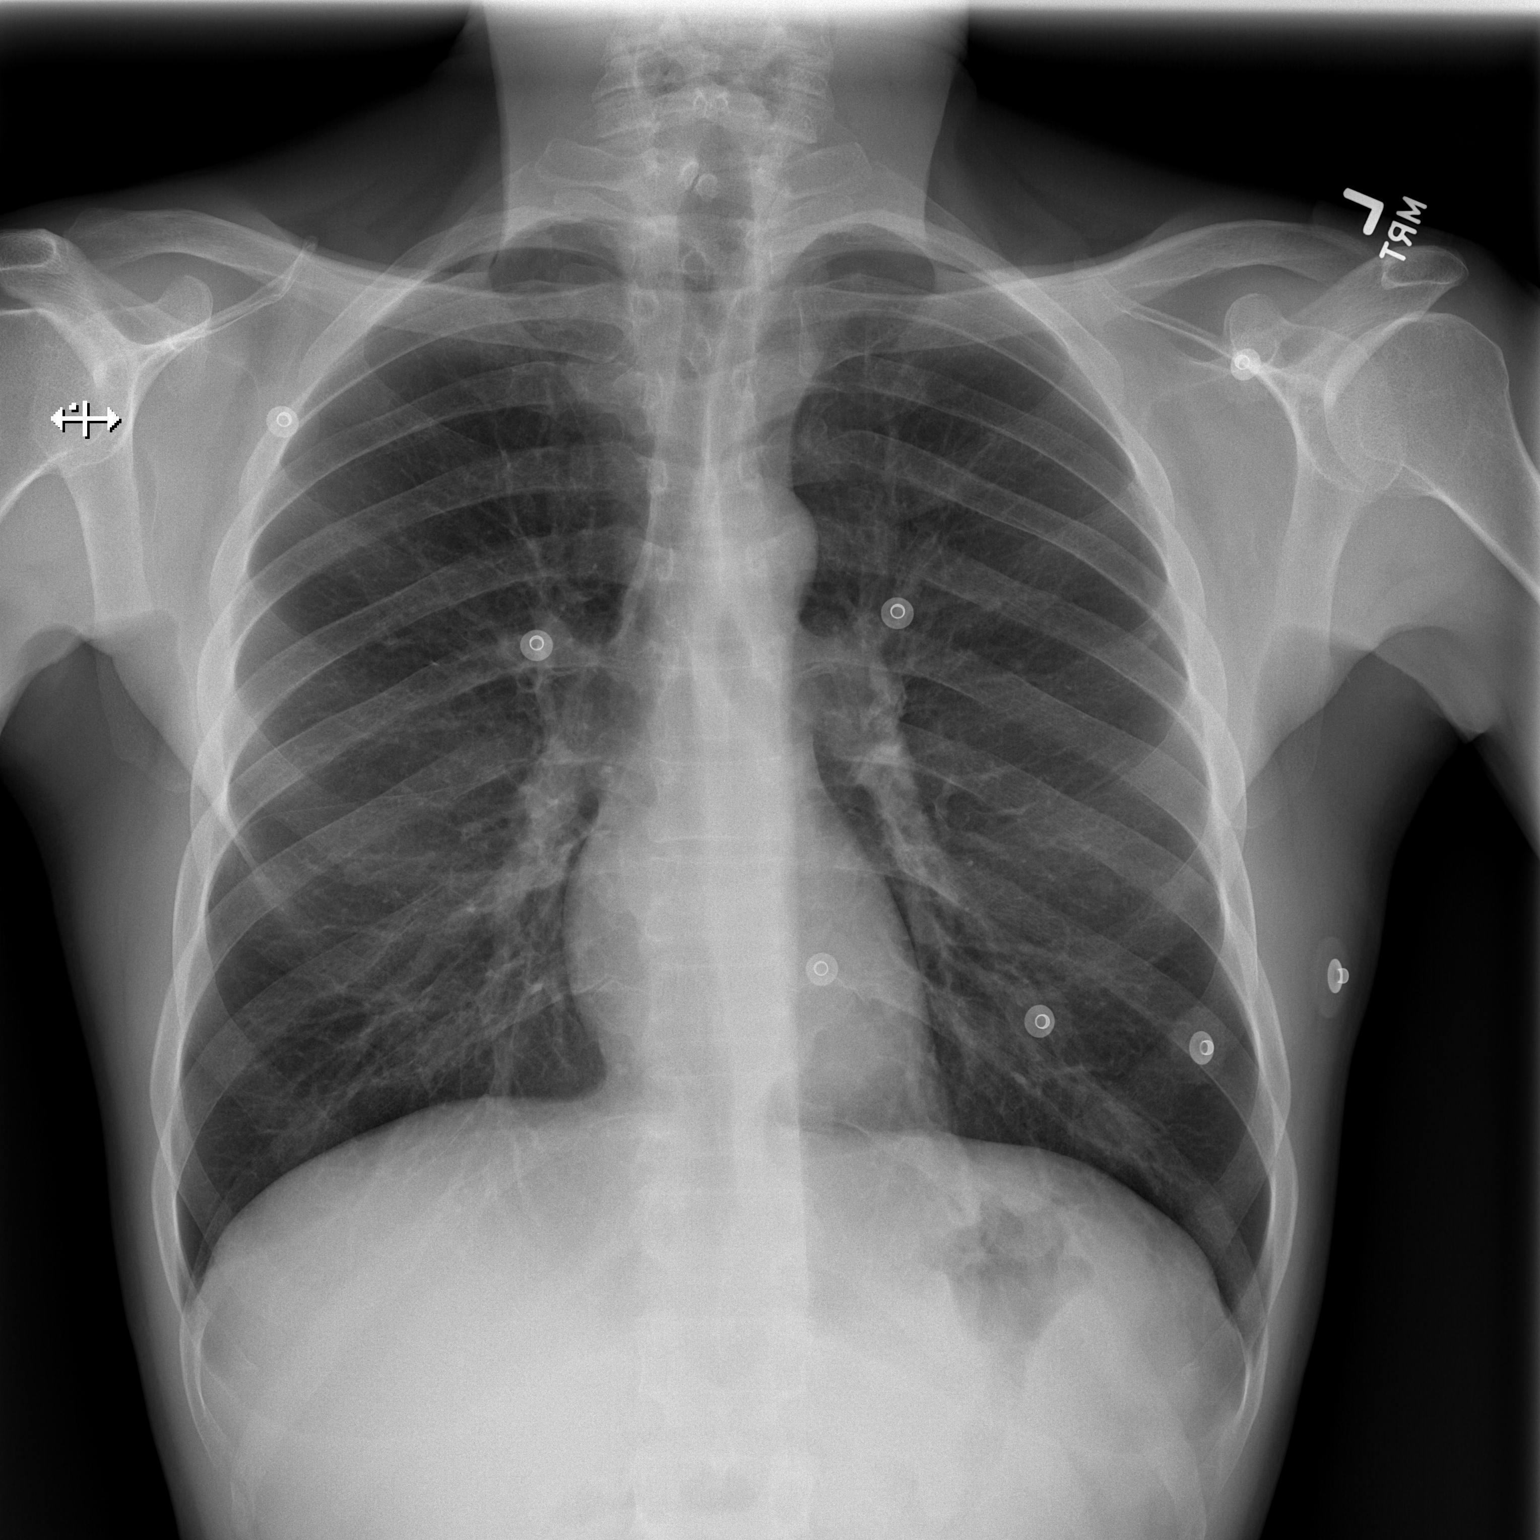

[w chest lat]
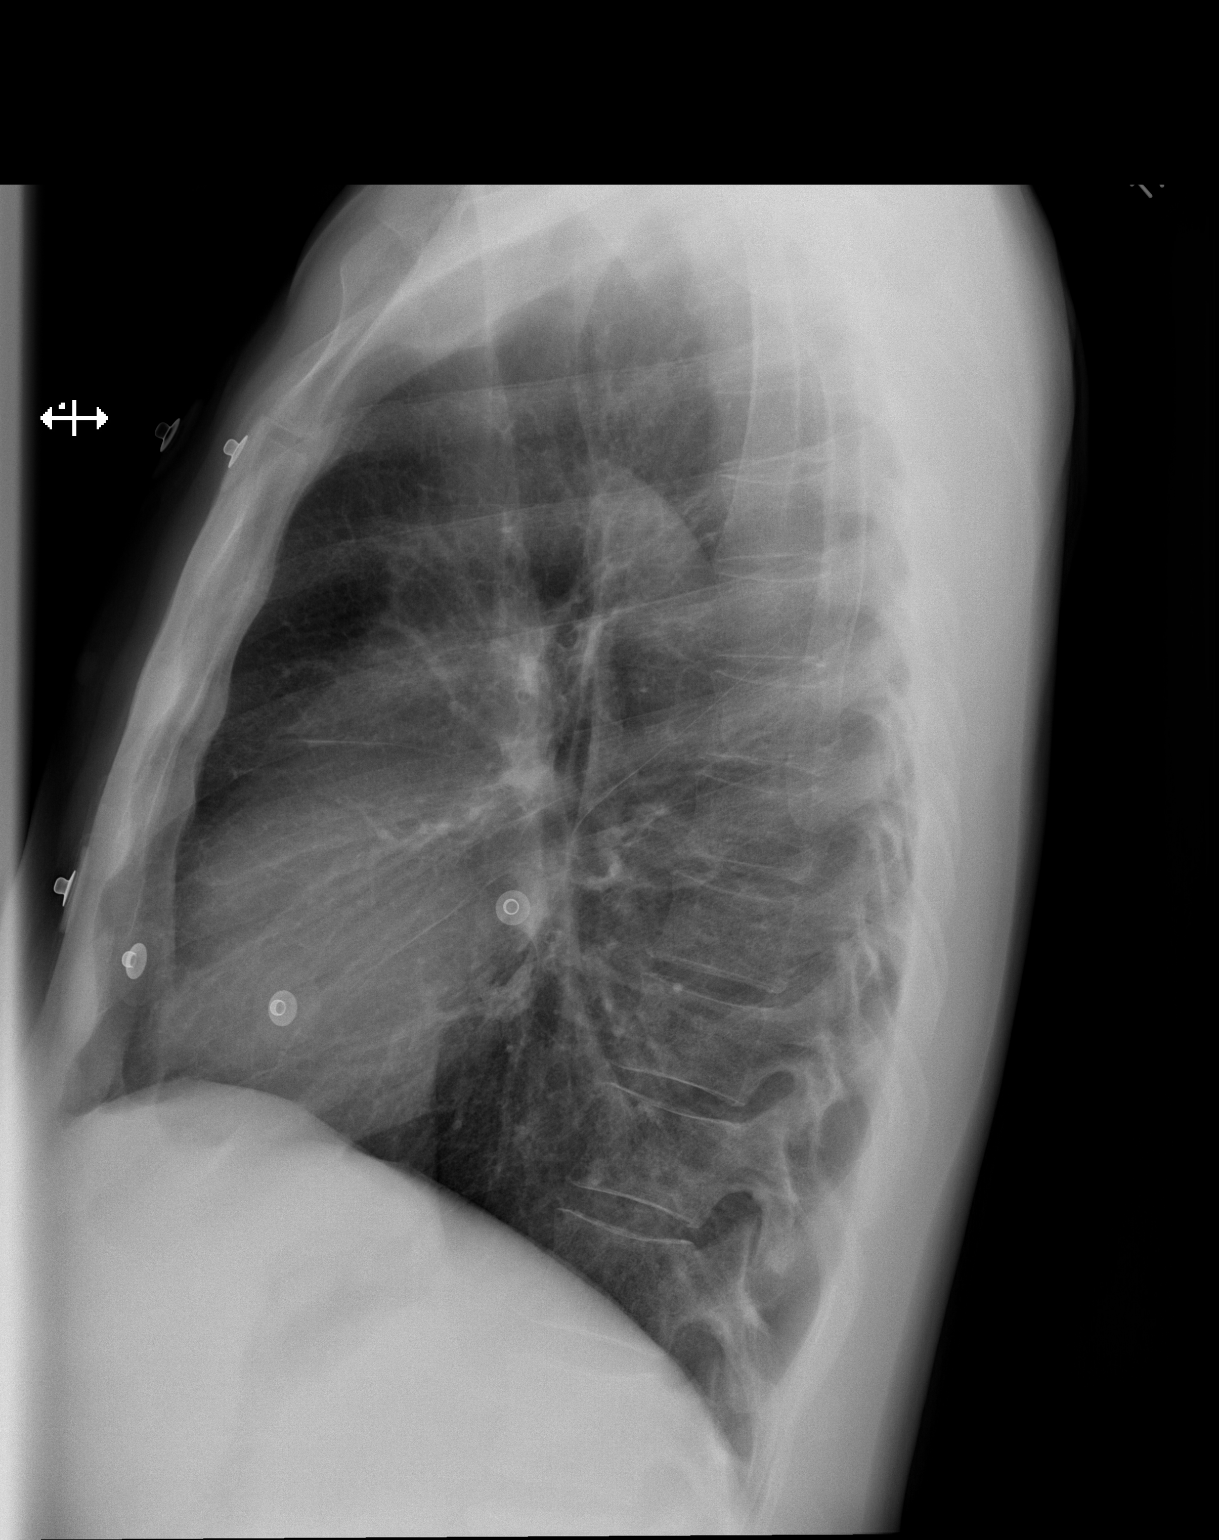

[2 of 2 positions shown; findings below may reference images not displayed]

FINDINGS: Heart size and mediastinal contours are within normal limits.
Chronic bronchitic changes noted centrally. Lungs otherwise clear.
No pleural effusion or pneumothorax seen. No acute or suspicious
osseous finding.
IMPRESSION: 1. No active cardiopulmonary disease. No evidence of pneumonia or
pulmonary edema.
2. Chronic bronchitic changes.

## 2020-08-05 ENCOUNTER — Emergency Department (HOSPITAL_COMMUNITY)
Admission: EM | Admit: 2020-08-05 | Discharge: 2020-08-06 | Disposition: A | Discharge status: Home / Self Care | Payer: 59 | Attending: Emergency Medicine | Admitting: Emergency Medicine

## 2020-08-05 ENCOUNTER — Emergency Department (HOSPITAL_COMMUNITY): Payer: 59

## 2020-08-05 DIAGNOSIS — Z7951 Long term (current) use of inhaled steroids: Secondary | ICD-10-CM | POA: Insufficient documentation

## 2020-08-05 DIAGNOSIS — Z91040 Latex allergy status: Secondary | ICD-10-CM | POA: Diagnosis not present

## 2020-08-05 DIAGNOSIS — J45909 Unspecified asthma, uncomplicated: Secondary | ICD-10-CM | POA: Insufficient documentation

## 2020-08-05 DIAGNOSIS — R0789 Other chest pain: Secondary | ICD-10-CM

## 2020-08-05 DIAGNOSIS — F17210 Nicotine dependence, cigarettes, uncomplicated: Secondary | ICD-10-CM | POA: Diagnosis not present

## 2020-08-05 DIAGNOSIS — I10 Essential (primary) hypertension: Secondary | ICD-10-CM | POA: Diagnosis not present

## 2020-08-05 DIAGNOSIS — Z79899 Other long term (current) drug therapy: Secondary | ICD-10-CM | POA: Diagnosis not present

## 2020-08-05 DIAGNOSIS — R072 Precordial pain: Secondary | ICD-10-CM | POA: Insufficient documentation

## 2020-08-05 LAB — BASIC METABOLIC PANEL
Anion gap: 8 (ref 5–15)
BUN: 8 mg/dL (ref 6–20)
CO2: 21 mmol/L — ABNORMAL LOW (ref 22–32)
Calcium: 9.3 mg/dL (ref 8.9–10.3)
Chloride: 104 mmol/L (ref 98–111)
Creatinine, Ser: 0.67 mg/dL (ref 0.61–1.24)
GFR, Estimated: >60 mL/min (ref >60)
Glucose, Bld: 94 mg/dL (ref 70–99)
Potassium: 3.9 mmol/L (ref 3.5–5.1)
Sodium: 133 mmol/L — ABNORMAL LOW (ref 135–145)

## 2020-08-05 LAB — CBC
HCT: 38.1 % — ABNORMAL LOW (ref 39.0–52.0)
Hemoglobin: 12.6 g/dL — ABNORMAL LOW (ref 13.0–17.0)
MCH: 33.5 pg (ref 26.0–34.0)
MCHC: 33.1 g/dL (ref 30.0–36.0)
MCV: 101.3 fL — ABNORMAL HIGH (ref 80.0–100.0)
Platelets: 204 10*3/uL (ref 150–400)
RBC: 3.76 MIL/uL — ABNORMAL LOW (ref 4.22–5.81)
RDW: 18.6 % — ABNORMAL HIGH (ref 11.5–15.5)
WBC: 8.2 10*3/uL (ref 4.0–10.5)
nRBC: 0 % (ref 0.0–0.2)

## 2020-08-05 LAB — URINALYSIS, ROUTINE W REFLEX MICROSCOPIC
Bilirubin Urine: NEGATIVE
Glucose, UA: NEGATIVE mg/dL
Hgb urine dipstick: NEGATIVE
Ketones, ur: NEGATIVE mg/dL
Leukocytes,Ua: NEGATIVE
Nitrite: NEGATIVE
Protein, ur: NEGATIVE mg/dL
Specific Gravity, Urine: 1.004 — ABNORMAL LOW (ref 1.005–1.030)
pH: 6 (ref 5.0–8.0)

## 2020-08-05 LAB — ETHANOL: Alcohol, Ethyl (B): 171 mg/dL — ABNORMAL HIGH (ref <10)

## 2020-08-05 LAB — TROPONIN I (HIGH SENSITIVITY): Troponin I (High Sensitivity): 5 ng/L (ref <18)

## 2020-08-05 LAB — RAPID URINE DRUG SCREEN, HOSP PERFORMED
Amphetamines: NOT DETECTED
Barbiturates: NOT DETECTED
Benzodiazepines: NOT DETECTED
Cocaine: NOT DETECTED
Opiates: NOT DETECTED
Tetrahydrocannabinol: NOT DETECTED

## 2020-08-05 NOTE — ED Triage Notes (Addendum)
Pt arrives from EMS, EMS reports chest pain radiating  Left side of neck  starting about an hour ago. EMS reports pt has an appointment with his cardiologist tomorrow. Pt states he felt palpitations earlier today   And have an hx of "WPW"  18 R ac

## 2020-08-06 DIAGNOSIS — R072 Precordial pain: Secondary | ICD-10-CM | POA: Diagnosis not present

## 2020-08-06 LAB — TROPONIN I (HIGH SENSITIVITY): Troponin I (High Sensitivity): 4 ng/L (ref <18)

## 2020-08-06 MED ORDER — ALUM & MAG HYDROXIDE-SIMETH 200-200-20 MG/5ML PO SUSP
30.0000 mL | Freq: Once | ORAL | Status: AC
  Administered 2020-08-06: 30 mL via ORAL
  Filled 2020-08-06: qty 30

## 2020-08-06 MED ORDER — LIDOCAINE VISCOUS HCL 2 % MT SOLN
15.0000 mL | Freq: Once | OROMUCOSAL | Status: AC
  Administered 2020-08-06: 15 mL via ORAL
  Filled 2020-08-06 (×2): qty 15

## 2020-08-06 NOTE — ED Provider Notes (Signed)
[facility] EMERGENCY DEPARTMENT Provider Note   CSN: [id number] Arrival date & time: 08/05/20  2152     History Chief Complaint  Patient presents with  . Chest Pain    [name] is a 50 y.o. male.  Patient is a 50 year old male with history of bipolar disorder, hypertension, polysubstance abuse, alcohol abuse.  Patient presents today for evaluation of chest pain.  This started earlier today.  Patient well-known to the emergency department for frequent visits involving chronic pain related to pancreatitis.  The history is provided by the patient.  Chest Pain Pain location:  Substernal area Pain quality: sharp   Pain radiates to:  Does not radiate Pain severity:  Moderate Onset quality:  Sudden      Past Medical History:  Diagnosis Date  . Alcoholism /alcohol abuse   . Anemia   . Anxiety   . Arthritis    "knees; arms; elbows" (03/26/2015)  . Asthma   . Bipolar disorder ([place])   . Chronic bronchitis ([place])   . Chronic lower back pain   . Chronic pancreatitis ([place])   . Cocaine abuse ([place])   . Depression   . Family history of adverse reaction to anesthesia    "grandmother gets confused"  . Femoral condyle fracture ([place]) 03/08/2014   left medial/notes 03/09/2014  . GERD (gastroesophageal reflux disease)   . H/O hiatal hernia   . H/O suicide attempt 10/2012  . High cholesterol   . History of blood transfusion 10/2012   "when I tried to commit suicide"  . History of stomach ulcers   . Hypertension   . Marijuana abuse, continuous   . Migraine    "a few times/year" (03/26/2015)  . Pneumonia 1990's X 3  . PTSD (post-traumatic stress disorder)   . Sickle cell trait ([place])   . WPW (Wolff-Parkinson-White syndrome)    [name] 03/06/2013    Patient Active Problem List   Diagnosis Date Noted  . AKI (acute kidney injury) ([place]) 11/13/2018  . Seizure ([place]) 11/13/2018  . Gastritis and gastroduodenitis   . Chest pain 01/08/2018  . GI bleed  11/24/2017  . Acute blood loss anemia 11/24/2017  . Atypical chest pain 11/24/2017  . Acute pancreatitis 09/28/2017  . Abdominal pain 05/27/2017  . Hematemesis 05/27/2017  . Tachycardia 03/18/2017  . Diarrhea 03/18/2017  . Acute on chronic pancreatitis ([place]) 12/17/2016  . Intractable nausea and vomiting 12/05/2016  . Verbally abusive behavior 12/05/2016  . Normocytic anemia 12/05/2016  . Alcohol use disorder, severe, dependence ([place]) 07/25/2016  . Cocaine use disorder, severe, dependence ([place]) 07/25/2016  . 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 Leukocytosis   . Hospital acquired PNA 05/20/2015  . Chronic pancreatitis ([place]) 05/18/2015  . Pseudocyst of pancreas 05/18/2015  . Polysubstance abuse (tobacco, cocaine, THC, and ETOH) 03/26/2015  . Alcohol-induced chronic pancreatitis ([place])   . Essential hypertension 02/06/2014  . Mood disorder ([place]) 02/06/2014  . Alcohol-induced acute pancreatitis 11/28/2013  . Pancreatic pseudocyst/cyst 11/25/2013  . Severe protein-calorie malnutrition ([place]) 10/10/2013  . Suicide attempt ([place]) 10/08/2013  . 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 TOBACCO ABUSE 03/23/2007    Past Surgical History:  Procedure Laterality Date  . BIOPSY  11/25/2017   Procedure: BIOPSY;  Surgeon: Arta Silence, MD;  Location: [facility] ENDOSCOPY;  Service: Endoscopy;;  . BIOPSY  10/14/2018   Procedure: BIOPSY;  Surgeon: Arta Silence, MD;  Location[facility] ENDOSCOPY;  Service: Endoscopy;;  .  CARDIAC CATHETERIZATION    . CYST ENTEROSTOMY  01/02/2020   Procedure: CYST ASPIRATION;  Surgeon: Milus Banister, MD;  Location: [facility] ENDOSCOPY;  Service: Endoscopy;;  . ESOPHAGOGASTRODUODENOSCOPY (EGD) WITH PROPOFOL N/A 11/25/2017   Procedure: ESOPHAGOGASTRODUODENOSCOPY (EGD) WITH PROPOFOL;  Surgeon: Arta Silence, MD;  Location: [place];  Service: Endoscopy;  Laterality: N/A;  .  ESOPHAGOGASTRODUODENOSCOPY (EGD) WITH PROPOFOL Left 10/14/2018   Procedure: ESOPHAGOGASTRODUODENOSCOPY (EGD) WITH PROPOFOL;  Surgeon: Arta Silence, MD;  Location: [facility] ENDOSCOPY;  Service: Endoscopy;  Laterality: Left;  . ESOPHAGOGASTRODUODENOSCOPY (EGD) WITH PROPOFOL N/A 11/14/2018   Procedure: ESOPHAGOGASTRODUODENOSCOPY (EGD) WITH PROPOFOL;  Surgeon: Laurence Spates, MD;  Location: [facility] ENDOSCOPY;  Service: Gastroenterology;  Laterality: N/A;  . ESOPHAGOGASTRODUODENOSCOPY (EGD) WITH PROPOFOL N/A 01/02/2020   Procedure: ESOPHAGOGASTRODUODENOSCOPY (EGD) WITH PROPOFOL;  Surgeon: Milus Banister, MD;  Location: [facility] ENDOSCOPY;  Service: Endoscopy;  Laterality: N/A;  . EUS N/A 01/02/2020   Procedure: UPPER ENDOSCOPIC ULTRASOUND (EUS) RADIAL;  Surgeon: Milus Banister, MD;  Location: [facility] ENDOSCOPY;  Service: Endoscopy;  Laterality: N/A;  . EYE SURGERY Left 1990's   "result of trauma"   . FACIAL FRACTURE SURGERY Left 1990's   "result of trauma"   . FRACTURE SURGERY    . HERNIA REPAIR    . LEFT HEART CATHETERIZATION WITH CORONARY ANGIOGRAM Right 03/07/2013   Procedure: LEFT HEART CATHETERIZATION WITH CORONARY ANGIOGRAM;  Surgeon: [name], MD;  Location: [facility] CATH LAB;  Service: Cardiovascular;  Laterality: Right;  . UMBILICAL HERNIA REPAIR    . UPPER GASTROINTESTINAL ENDOSCOPY         Family History  Problem Relation Age of Onset  . Hypertension Mother   . Cirrhosis Mother   . Alcoholism Mother   . Hypertension Father   . Melanoma Father   . Hypertension Other   . Coronary artery disease Other     Social History   Tobacco Use  . Smoking status: Current Every Day Smoker    Packs/day: 1.00    Years: 36.00    Pack years: 36.00    Types: Cigarettes, E-cigarettes  . Smokeless tobacco: Never Used  Vaping Use  . Vaping Use: Former  Substance Use Topics  . Alcohol use: Yes    Comment: last drink 8 days ago  . Drug use: Yes    Types: Marijuana, Cocaine    Comment: daily marijuana use;  last cocaine use about 3 months ago    Home Medications Prior to Admission medications   Medication Sig Start Date End Date Taking? Authorizing Provider  acetaminophen (TYLENOL) 500 MG tablet Take 2 tablets (1,000 mg total) by mouth every 8 (eight) hours as needed for mild pain. 08/29/19   Elodia Florence., MD  albuterol (VENTOLIN HFA) 108 (90 Base) MCG/ACT inhaler Inhale 2 puffs into the lungs every 4 (four) hours as needed for wheezing or shortness of breath. 11/12/19   [name], DO  Cyanocobalamin (VITAMIN B-12 PO) Take 1 tablet by mouth daily.    [provider]  fluticasone furoate-vilanterol (BREO ELLIPTA) 200-25 MCG/INH AEPB Inhale 1 puff into the lungs daily. 11/12/19   [name], DO  folic acid (FOLVITE) 1 MG tablet Take 1 tablet (1 mg total) by mouth daily. 11/26/17   [name], MD  hydrALAZINE (APRESOLINE) 25 MG tablet Take 1 tablet (25 mg total) by mouth 3 (three) times daily. 03/27/20   [name], MD  lidocaine (XYLOCAINE) 2 % solution Use as directed 15 mLs in the mouth or  throat every 6 (six) hours as needed for mouth pain. 05/25/20   [name], MD  lipase/protease/amylase (CREON) 36000 UNITS CPEP capsule Take 2 capsules (72,000 Units total) by mouth 3 (three) times daily with meals. For pancreatitis Patient taking differently: Take 72,000 Units by mouth 3 (three) times daily with meals. 11/05/19   [name], [name], MD  loperamide (IMODIUM A-D) 2 MG tablet Take 2 mg by mouth 4 (four) times daily as needed for diarrhea or loose stools.     [provider]  metoprolol tartrate (LOPRESSOR) 100 MG tablet Take 1 tablet (100 mg total) by mouth 2 (two) times daily. 05/23/20   Kerin Perna, NP  Multiple Vitamin (MULTIVITAMIN WITH MINERALS) TABS tablet Take 1 tablet by mouth daily. 09/06/17   Bonnell Public, MD  NASONEX 50 MCG/ACT nasal spray Place 2 sprays into the nose daily as needed (allergies).  06/19/19   [provider]  omeprazole (PRILOSEC) 40 MG capsule Take once to twice daily as needed 04/07/20   [name], [name], MD  QUEtiapine (SEROQUEL) 50 MG tablet Take 1 tablet (50 mg total) by mouth at bedtime. 03/27/20   [name], MD  sertraline (ZOLOFT) 100 MG tablet Take 1 tablet (100 mg total) by mouth daily. 03/27/20   [name], MD  sucralfate (CARAFATE) 1 g tablet Take 1 tablet (1 g total) by mouth 4 (four) times daily -  with meals and at bedtime. 05/23/20   Kerin Perna, NP  thiamine 100 MG tablet Take 1 tablet (100 mg total) by mouth daily. 10/16/18   [name], MD  amitriptyline (ELAVIL) 25 MG tablet Take 1 tablet (25 mg total) by mouth at bedtime. Patient not taking: Reported on 08/08/2019 10/15/18 08/08/19  [name], MD  gabapentin (NEURONTIN) 100 MG capsule Take 1 capsule (100 mg total) by mouth 2 (two) times daily. Patient not taking: Reported on 08/08/2019 10/15/18 08/08/19  [name], MD  pantoprazole (PROTONIX) 40 MG tablet Take 1 tablet (40 mg total) by mouth 2 (two) times daily. Patient not taking: Reported on 11/17/2019 11/05/19 03/10/20  Yetta Flock, MD  promethazine (PHENERGAN) 25 MG tablet Take 1 tablet (25 mg total) by mouth every 6 (six) hours as needed for nausea or vomiting. Patient not taking: Reported on 08/08/2019 03/02/19 08/08/19  Kinnie Feil, PA-C    Allergies    Robaxin [methocarbamol], Shellfish-derived products, Trazodone and nefazodone, Adhesive [tape], Latex, Toradol [ketorolac tromethamine], Contrast media [iodinated diagnostic agents], and Reglan [metoclopramide]  Review of Systems   Review of Systems  Cardiovascular: Positive for chest pain.  All other systems reviewed and are negative.   Physical Exam Updated Vital Signs BP 123/89 (BP Location: Left Arm)   Pulse 79   Temp 98.2 F (36.8 C) (Oral)   Resp 15   SpO2 98%   Physical Exam Vitals and nursing note reviewed.  Constitutional:      General: He is not in acute  distress.    Appearance: He is well-developed. He is not diaphoretic.  HENT:     Head: Normocephalic and atraumatic.  Cardiovascular:     Rate and Rhythm: Normal rate and regular rhythm.     Heart sounds: No murmur heard. No friction rub.  Pulmonary:     Effort: Pulmonary effort is normal. No respiratory distress.     Breath sounds: Normal breath sounds. No wheezing or rales.  Abdominal:     General: Bowel sounds are normal. There is  no distension.     Palpations: Abdomen is soft.     Tenderness: There is no abdominal tenderness.  Musculoskeletal:        General: Normal range of motion.     Cervical back: Normal range of motion and neck supple.  Skin:    General: Skin is warm and dry.  Neurological:     Mental Status: He is alert and oriented to person, place, and time.     Coordination: Coordination normal.     ED Results / Procedures / Treatments   Labs (all labs ordered are listed, but only abnormal results are displayed) Labs Reviewed  BASIC METABOLIC PANEL - Abnormal; Notable for the following components:      Result Value   Sodium 133 (*)    CO2 21 (*)    All other components within normal limits  CBC - Abnormal; Notable for the following components:   RBC 3.76 (*)    Hemoglobin 12.6 (*)    HCT 38.1 (*)    MCV 101.3 (*)    RDW 18.6 (*)    All other components within normal limits  ETHANOL - Abnormal; Notable for the following components:   Alcohol, Ethyl (B) 171 (*)    All other components within normal limits  URINALYSIS, ROUTINE W REFLEX MICROSCOPIC - Abnormal; Notable for the following components:   Color, Urine STRAW (*)    Specific Gravity, Urine 1.004 (*)    All other components within normal limits  RAPID URINE DRUG SCREEN, HOSP PERFORMED  TROPONIN I (HIGH SENSITIVITY)  TROPONIN I (HIGH SENSITIVITY)    EKG EKG Interpretation  Date/Time:  Wednesday August 05 2020 21:54:04 EDT Ventricular Rate:  87 PR Interval:  110 QRS Duration: 82 QT  Interval:  336 QTC Calculation: 404 R Axis:   76 Text Interpretation: Sinus rhythm with short PR Right atrial enlargement Nonspecific T wave abnormality Abnormal ECG No significant change since last tracing Confirmed by Merrily Pew [phone]) on 08/05/2020 11:50:45 PM   Radiology DG Chest 2 View  Result Date: 08/05/2020 CLINICAL DATA:  Left-sided chest pain EXAM: CHEST - 2 VIEW COMPARISON:  03/29/2020 FINDINGS: Cardiac shadow is within normal limits. Lungs are with well aerated bilaterally. No focal infiltrate or effusion is seen. No bony abnormality is noted. IMPRESSION: No acute abnormality seen. Electronically Signed   By: Inez Catalina M.D.   On: 08/05/2020 22:37    Procedures Procedures   Medications Ordered in ED Medications - No data to display  ED Course  I have reviewed the triage vital signs and the nursing notes.  Pertinent labs & imaging results that were available during my care of the patient were reviewed by me and considered in my medical decision making (see chart for details).    MDM Rules/Calculators/A&P  Patient presenting here with complaints of chest pain that is atypical for cardiac pain.  His troponin is negative and EKG is unchanged.  Laboratory studies unremarkable otherwise with the exception of his blood alcohol level.  Patient to be discharged.  He has a follow-up appointment with his cardiologist tomorrow.  Final Clinical Impression(s) / ED Diagnoses Final diagnoses:  None    Rx / DC Orders ED Discharge Orders    None       Veryl Speak, MD 08/06/20 0150

## 2020-08-06 NOTE — Discharge Instructions (Addendum)
Follow-up with your cardiologist tomorrow as scheduled.

## 2020-08-10 ENCOUNTER — Emergency Department (HOSPITAL_COMMUNITY): Payer: 59

## 2020-08-10 ENCOUNTER — Emergency Department (HOSPITAL_COMMUNITY)
Admission: EM | Admit: 2020-08-10 | Discharge: 2020-08-10 | Disposition: A | Discharge status: Home / Self Care | Payer: 59 | Attending: Emergency Medicine | Admitting: Emergency Medicine

## 2020-08-10 ENCOUNTER — Encounter (HOSPITAL_COMMUNITY): Payer: Self-pay | Admitting: *Deleted

## 2020-08-10 ENCOUNTER — Other Ambulatory Visit: Payer: Self-pay

## 2020-08-10 DIAGNOSIS — F17210 Nicotine dependence, cigarettes, uncomplicated: Secondary | ICD-10-CM | POA: Diagnosis not present

## 2020-08-10 DIAGNOSIS — R0789 Other chest pain: Secondary | ICD-10-CM | POA: Insufficient documentation

## 2020-08-10 DIAGNOSIS — Z79899 Other long term (current) drug therapy: Secondary | ICD-10-CM | POA: Diagnosis not present

## 2020-08-10 DIAGNOSIS — I10 Essential (primary) hypertension: Secondary | ICD-10-CM | POA: Insufficient documentation

## 2020-08-10 DIAGNOSIS — Z91040 Latex allergy status: Secondary | ICD-10-CM | POA: Diagnosis not present

## 2020-08-10 DIAGNOSIS — J45909 Unspecified asthma, uncomplicated: Secondary | ICD-10-CM | POA: Insufficient documentation

## 2020-08-10 DIAGNOSIS — R079 Chest pain, unspecified: Secondary | ICD-10-CM | POA: Diagnosis present

## 2020-08-10 DIAGNOSIS — Z7952 Long term (current) use of systemic steroids: Secondary | ICD-10-CM | POA: Diagnosis not present

## 2020-08-10 LAB — CBC WITH DIFFERENTIAL/PLATELET
Abs Immature Granulocytes: 0.02 10*3/uL (ref 0.00–0.07)
Basophils Absolute: 0 10*3/uL (ref 0.0–0.1)
Basophils Relative: 0 %
Eosinophils Absolute: 0.2 10*3/uL (ref 0.0–0.5)
Eosinophils Relative: 2 %
HCT: 37.1 % — ABNORMAL LOW (ref 39.0–52.0)
Hemoglobin: 12.1 g/dL — ABNORMAL LOW (ref 13.0–17.0)
Immature Granulocytes: 0 %
Lymphocytes Relative: 39 %
Lymphs Abs: 2.6 10*3/uL (ref 0.7–4.0)
MCH: 33.9 pg (ref 26.0–34.0)
MCHC: 32.6 g/dL (ref 30.0–36.0)
MCV: 103.9 fL — ABNORMAL HIGH (ref 80.0–100.0)
Monocytes Absolute: 0.7 10*3/uL (ref 0.1–1.0)
Monocytes Relative: 11 %
Neutro Abs: 3.1 10*3/uL (ref 1.7–7.7)
Neutrophils Relative %: 48 %
Platelets: 203 10*3/uL (ref 150–400)
RBC: 3.57 MIL/uL — ABNORMAL LOW (ref 4.22–5.81)
RDW: 18.6 % — ABNORMAL HIGH (ref 11.5–15.5)
WBC: 6.6 10*3/uL (ref 4.0–10.5)
nRBC: 0 % (ref 0.0–0.2)

## 2020-08-10 LAB — BASIC METABOLIC PANEL
Anion gap: 8 (ref 5–15)
BUN: 8 mg/dL (ref 6–20)
CO2: 22 mmol/L (ref 22–32)
Calcium: 8.6 mg/dL — ABNORMAL LOW (ref 8.9–10.3)
Chloride: 105 mmol/L (ref 98–111)
Creatinine, Ser: 0.91 mg/dL (ref 0.61–1.24)
GFR, Estimated: >60 mL/min (ref >60)
Glucose, Bld: 131 mg/dL — ABNORMAL HIGH (ref 70–99)
Potassium: 3.9 mmol/L (ref 3.5–5.1)
Sodium: 135 mmol/L (ref 135–145)

## 2020-08-10 LAB — TROPONIN I (HIGH SENSITIVITY): Troponin I (High Sensitivity): 5 ng/L (ref <18)

## 2020-08-10 MED ORDER — ACETAMINOPHEN 500 MG PO TABS
1000.0000 mg | ORAL_TABLET | Freq: Four times a day (QID) | ORAL | Status: DC | PRN
  Administered 2020-08-10: 1000 mg via ORAL
  Filled 2020-08-10: qty 2

## 2020-08-10 NOTE — ED Triage Notes (Signed)
Patient presents to ED via [facility] c/o chest pain onset last wed. And had cardiac monitor applied at [facility] by Dr. Brigitte Pulse. States he is supposed to have it removed on Thurs. C/c chest pain today states pain goes to his jaw and into his neck.

## 2020-08-10 NOTE — ED Provider Notes (Addendum)
[facility]ERGENCY DEPARTMENT Provider Note   CSN: [id number] Arrival date & time: 08/10/20  1132     History Chief Complaint  Patient presents with  . Chest Pain    [name] is a 50 y.o. male.  HPI   Mr. Kyllonen is a 50 year old male, history of chronic alcoholism and alcohol abuse as well as chronic pancreatitis, he has bipolar disorder and a history of cocaine abuse.  He presents with a complaint of chest pain, he states this is mid chest pain, he states that his neck and his shoulders are hurting.  He reports he has had the same thing in the past but has never been diagnosed with a heart attack or obstructive coronary disease.  In fact the patient has undergone cardiac testing in the past.  Review of the medical record shows echocardiogram with ejection fraction of 60 to 65% with grade 1 diastolic dysfunction from 1 year ago.  At that same visit the patient had a VQ scan which was normal showing no signs of PE.  In 2014 the patient had a myocardial perfusion study by Dr. Doylene Canard, this showed no areas of reversibility, no inducible ischemia.  The patient reports to me that within the last couple of weeks he was in an alcohol inpatient unit in [place], he went to the hospital because of acute pancreatitis and was given a dose of morphine, after that the patient reports that he was kicked out of the program because he tested positive for drugs in the form of opiates.  He is now back in the city, he is still drinking, he cannot tell me exactly what brings this pain on, it seems to be persistent, has been on since last night.  He actually did go to his doctor's office last week because of palpitations and had a cardiac monitor placed.  He will be following up on Thursday for results.  Past Medical History:  Diagnosis Date  . Alcoholism /alcohol abuse   . Anemia   . Anxiety   . Arthritis    "knees; arms; elbows" (03/26/2015)  . Asthma   . Bipolar  disorder ([place])   . Chronic bronchitis ([place])   . Chronic lower back pain   . Chronic pancreatitis ([place])   . Cocaine abuse ([place])   . Depression   . Family history of adverse reaction to anesthesia    "grandmother gets confused"  . Femoral condyle fracture ([place]) 03/08/2014   left medial/notes 03/09/2014  . GERD (gastroesophageal reflux disease)   . H/O hiatal hernia   . H/O suicide attempt 10/2012  . High cholesterol   . History of blood transfusion 10/2012   "when I tried to commit suicide"  . History of stomach ulcers   . Hypertension   . Marijuana abuse, continuous   . Migraine    "a few times/year" (03/26/2015)  . Pneumonia 1990's X 3  . PTSD (post-traumatic stress disorder)   . Sickle cell trait ([place])   . WPW (Wolff-Parkinson-White syndrome)    [name] 03/06/2013    Patient Active Problem List   Diagnosis Date Noted  . AKI (acute kidney injury) ([place]) 11/13/2018  . Seizure ([place]) 11/13/2018  . Gastritis and gastroduodenitis   . Chest pain 01/08/2018  . GI bleed 11/24/2017  . Acute blood loss anemia 11/24/2017  . Atypical chest pain 11/24/2017  . Acute pancreatitis 09/28/2017  . Abdominal pain 05/27/2017  . Hematemesis 05/27/2017  . Tachycardia 03/18/2017  . Diarrhea 03/18/2017  .  Acute on chronic pancreatitis ([place]) 12/17/2016  . Intractable nausea and vomiting 12/05/2016  . Verbally abusive behavior 12/05/2016  . Normocytic anemia 12/05/2016  . Alcohol use disorder, severe, dependence ([place]) 07/25/2016  . Cocaine use disorder, severe, dependence ([place]) 07/25/2016  . 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 Leukocytosis   . Hospital acquired PNA 05/20/2015  . Chronic pancreatitis ([place]) 05/18/2015  . Pseudocyst of pancreas 05/18/2015  . Polysubstance abuse (tobacco, cocaine, THC, and ETOH) 03/26/2015  . Alcohol-induced chronic pancreatitis ([place])   . Essential hypertension 02/06/2014  . Mood disorder ([place]) 02/06/2014  .  Alcohol-induced acute pancreatitis 11/28/2013  . Pancreatic pseudocyst/cyst 11/25/2013  . Severe protein-calorie malnutrition ([place]) 10/10/2013  . Suicide attempt ([place]) 10/08/2013  . 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 TOBACCO ABUSE 03/23/2007    Past Surgical History:  Procedure Laterality Date  . BIOPSY  11/25/2017   Procedure: BIOPSY;  Surgeon: Arta Silence, MD;  Location: [facility] ENDOSCOPY;  Service: Endoscopy;;  . BIOPSY  10/14/2018   Procedure: BIOPSY;  Surgeon: Arta Silence, MD;  Location: [facility];  Service: Endoscopy;;  . CARDIAC CATHETERIZATION    . CYST ENTEROSTOMY  01/02/2020   Procedure: CYST ASPIRATION;  Surgeon: Milus Banister, MD;  Location: [facility] ENDOSCOPY;  Service: Endoscopy;;  . ESOPHAGOGASTRODUODENOSCOPY (EGD) WITH PROPOFOL N/A 11/25/2017   Procedure: ESOPHAGOGASTRODUODENOSCOPY (EGD) WITH PROPOFOL;  Surgeon: Arta Silence, MD;  Location: [facility];  Service: Endoscopy;  Laterality: N/A;  . ESOPHAGOGASTRODUODENOSCOPY (EGD) WITH PROPOFOL Left 10/14/2018   Procedure: ESOPHAGOGASTRODUODENOSCOPY (EGD) WITH PROPOFOL;  Surgeon: Arta Silence, MD;  Location: [facility] ENDOSCOPY;  Service: Endoscopy;  Laterality: Left;  . ESOPHAGOGASTRODUODENOSCOPY (EGD) WITH PROPOFOL N/A 11/14/2018   Procedure: ESOPHAGOGASTRODUODENOSCOPY (EGD) WITH PROPOFOL;  Surgeon: Laurence Spates, MD;  Location: [facility] ENDOSCOPY;  Service: Gastroenterology;  Laterality: N/A;  . ESOPHAGOGASTRODUODENOSCOPY (EGD) WITH PROPOFOL N/A 01/02/2020   Procedure: ESOPHAGOGASTRODUODENOSCOPY (EGD) WITH PROPOFOL;  Surgeon: Milus Banister, MD;  Location: [facility] ENDOSCOPY;  Service: Endoscopy;  Laterality: N/A;  . EUS N/A 01/02/2020   Procedure: UPPER ENDOSCOPIC ULTRASOUND (EUS) RADIAL;  Surgeon: Milus Banister, MD;  Location: [facility] ENDOSCOPY;  Service: Endoscopy;  Laterality: N/A;  . EYE SURGERY Left 1990's   "result of trauma"   . FACIAL FRACTURE SURGERY Left 1990's   "result of trauma"   . FRACTURE  SURGERY    . HERNIA REPAIR    . LEFT HEART CATHETERIZATION WITH CORONARY ANGIOGRAM Right 03/07/2013   Procedure: LEFT HEART CATHETERIZATION WITH CORONARY ANGIOGRAM;  Surgeon: [name], MD;  Location: [facility] CATH LAB;  Service: Cardiovascular;  Laterality: Right;  . UMBILICAL HERNIA REPAIR    . UPPER GASTROINTESTINAL ENDOSCOPY         Family History  Problem Relation Age of Onset  . Hypertension Mother   . Cirrhosis Mother   . Alcoholism Mother   . Hypertension Father   . Melanoma Father   . Hypertension Other   . Coronary artery disease Other     Social History   Tobacco Use  . Smoking status: Current Every Day Smoker    Packs/day: 1.00    Years: 36.00    Pack years: 36.00    Types: Cigarettes, E-cigarettes  . Smokeless tobacco: Never Used  Vaping Use  . Vaping Use: Former  Substance Use Topics  . Alcohol use: Yes    Comment: last drink 8 days ago  . Drug use: Yes    Types: Marijuana, Cocaine  Comment: daily marijuana use; last cocaine use about 3 months ago    Home Medications Prior to Admission medications   Medication Sig Start Date End Date Taking? Authorizing Provider  acetaminophen (TYLENOL) 500 MG tablet Take 2 tablets (1,000 mg total) by mouth every 8 (eight) hours as needed for mild pain. 08/29/19   Elodia Florence., MD  albuterol (VENTOLIN HFA) 108 (90 Base) MCG/ACT inhaler Inhale 2 puffs into the lungs every 4 (four) hours as needed for wheezing or shortness of breath. 11/12/19   [name], DO  Cyanocobalamin (VITAMIN B-12 PO) Take 1 tablet by mouth daily.    [provider]  fluticasone furoate-vilanterol (BREO ELLIPTA) 200-25 MCG/INH AEPB Inhale 1 puff into the lungs daily. 11/12/19   [name], DO  folic acid (FOLVITE) 1 MG tablet Take 1 tablet (1 mg total) by mouth daily. 11/26/17   [name], MD  hydrALAZINE (APRESOLINE) 25 MG tablet Take 1 tablet (25 mg total) by mouth 3 (three) times daily. 03/27/20   [name], MD   lidocaine (XYLOCAINE) 2 % solution Use as directed 15 mLs in the mouth or throat every 6 (six) hours as needed for mouth pain. 05/25/20   [name], MD  lipase/protease/amylase (CREON) 36000 UNITS CPEP capsule Take 2 capsules (72,000 Units total) by mouth 3 (three) times daily with meals. For pancreatitis Patient taking differently: Take 72,000 Units by mouth 3 (three) times daily with meals. 11/05/19   [name], [name], MD  loperamide (IMODIUM A-D) 2 MG tablet Take 2 mg by mouth 4 (four) times daily as needed for diarrhea or loose stools.     [provider]  metoprolol tartrate (LOPRESSOR) 100 MG tablet Take 1 tablet (100 mg total) by mouth 2 (two) times daily. 05/23/20   Kerin Perna, NP  Multiple Vitamin (MULTIVITAMIN WITH MINERALS) TABS tablet Take 1 tablet by mouth daily. 09/06/17   Bonnell Public, MD  NASONEX 50 MCG/ACT nasal spray Place 2 sprays into the nose daily as needed (allergies).  06/19/19   [provider]  omeprazole (PRILOSEC) 40 MG capsule Take once to twice daily as needed 04/07/20   [name], [name], MD  QUEtiapine (SEROQUEL) 50 MG tablet Take 1 tablet (50 mg total) by mouth at bedtime. 03/27/20   [name], MD  sertraline (ZOLOFT) 100 MG tablet Take 1 tablet (100 mg total) by mouth daily. 03/27/20   [name], MD  sucralfate (CARAFATE) 1 g tablet Take 1 tablet (1 g total) by mouth 4 (four) times daily -  with meals and at bedtime. 05/23/20   Kerin Perna, NP  thiamine 100 MG tablet Take 1 tablet (100 mg total) by mouth daily. 10/16/18   [name], MD  amitriptyline (ELAVIL) 25 MG tablet Take 1 tablet (25 mg total) by mouth at bedtime. Patient not taking: Reported on 08/08/2019 10/15/18 08/08/19  [name], MD  gabapentin (NEURONTIN) 100 MG capsule Take 1 capsule (100 mg total) by mouth 2 (two) times daily. Patient not taking: Reported on 08/08/2019 10/15/18 08/08/19  [name], MD  pantoprazole (PROTONIX) 40 MG  tablet Take 1 tablet (40 mg total) by mouth 2 (two) times daily. Patient not taking: Reported on 11/17/2019 11/05/19 03/10/20  Yetta Flock, MD  promethazine (PHENERGAN) 25 MG tablet Take 1 tablet (25 mg total) by mouth every 6 (six) hours as needed for nausea or vomiting. Patient not taking: Reported on 08/08/2019 03/02/19 08/08/19  Donney Rankins,  Orson Gear, PA-C    Allergies    Robaxin [methocarbamol], Shellfish-derived products, Trazodone and nefazodone, Adhesive [tape], Latex, Toradol [ketorolac tromethamine], Contrast media [iodinated diagnostic agents], and Reglan [metoclopramide]  Review of Systems   Review of Systems  All other systems reviewed and are negative.   Physical Exam Updated Vital Signs BP (!) 137/97   Pulse 72   Temp 98.6 F (37 C) (Oral)   Resp 19   Ht 1.727 m (5\' 8" )   Wt 54.4 kg   SpO2 100%   BMI 18.25 kg/m   Physical Exam Vitals and nursing note reviewed.  Constitutional:      General: He is not in acute distress.    Appearance: He is well-developed.  HENT:     Head: Normocephalic and atraumatic.     Mouth/Throat:     Pharynx: No oropharyngeal exudate.  Eyes:     General: No scleral icterus.       Right eye: No discharge.        Left eye: No discharge.     Conjunctiva/sclera: Conjunctivae normal.     Pupils: Pupils are equal, round, and reactive to light.  Neck:     Thyroid: No thyromegaly.     Vascular: No JVD.  Cardiovascular:     Rate and Rhythm: Normal rate and regular rhythm.     Heart sounds: Normal heart sounds. No murmur heard. No friction rub. No gallop.   Pulmonary:     Effort: Pulmonary effort is normal. No respiratory distress.     Breath sounds: Normal breath sounds. No wheezing or rales.  Abdominal:     General: Bowel sounds are normal. There is no distension.     Palpations: Abdomen is soft. There is no mass.     Tenderness: There is no abdominal tenderness.  Musculoskeletal:        General: No tenderness. Normal range of  motion.     Cervical back: Normal range of motion and neck supple.  Lymphadenopathy:     Cervical: No cervical adenopathy.  Skin:    General: Skin is warm and dry.     Findings: No erythema or rash.  Neurological:     Mental Status: He is alert.     Coordination: Coordination normal.  Psychiatric:        Behavior: Behavior normal.     ED Results / Procedures / Treatments   Labs (all labs ordered are listed, but only abnormal results are displayed) Labs Reviewed  BASIC METABOLIC PANEL - Abnormal; Notable for the following components:      Result Value   Glucose, Bld 131 (*)    Calcium 8.6 (*)    All other components within normal limits  CBC WITH DIFFERENTIAL/PLATELET - Abnormal; Notable for the following components:   RBC 3.57 (*)    Hemoglobin 12.1 (*)    HCT 37.1 (*)    MCV 103.9 (*)    RDW 18.6 (*)    All other components within normal limits  TROPONIN I (HIGH SENSITIVITY)    EKG EKG Interpretation  Date/Time:  Monday August 10 2020 11:38:04 EDT Ventricular Rate:  81 PR Interval:  102 QRS Duration: 75 QT Interval:  313 QTC Calculation: 364 R Axis:   82 Text Interpretation: Sinus rhythm Short PR interval Biatrial enlargement Nonspecific T abnormalities, diffuse leads unchanged compared with prior including P wave appearanceand ST segment abnormalities Confirmed by Noemi Chapel [phone]) on 08/10/2020 11:48:19 AM   Radiology DG Chest [facility] [address]  Result Date: 08/10/2020 CLINICAL DATA:  Chest pain. EXAM: PORTABLE CHEST 1 VIEW COMPARISON:  August 05, 2020. FINDINGS: The heart size and mediastinal contours are within normal limits. Both lungs are clear. No visible pleural effusions or pneumothorax. No acute osseous abnormality. Electronic device projects over the lateral left chest. IMPRESSION: No evidence of acute cardiopulmonary disease. Electronically Signed   By: Margaretha Sheffield MD   On: 08/10/2020 13:15    Procedures Procedures   Medications Ordered in  ED Medications  acetaminophen (TYLENOL) tablet 1,000 mg (1,000 mg Oral Given 08/10/20 1252)    ED Course  I have reviewed the triage vital signs and the nursing notes.  Pertinent labs & imaging results that were available during my care of the patient were reviewed by me and considered in my medical decision making (see chart for details).    MDM Rules/Calculators/A&P                          This patient actually appears to be fairly normal on exam, when the patient is talking to me and distractible he has no abdominal tenderness, when he stops talking and asking if it hurts he immediately tenses his abdomen.  His heart sounds are normal, he has no murmurs, he has no JVD, no edema, normal pulses at the radial arteries.  I suspect that dissection and pulmonary embolism are very very low on the differential, his lungs are clear with no hypoxia, no tachycardia, no tachypnea and thus pulmonary embolism is also very likely for this reason.  He has had multiple evaluations in the past for his coronaries however nothing recently.  This may also be related to substance abuse given his history of cocaine use.  We will check a troponin and a repeat troponin otherwise EKG is unremarkable and unchanged.  The patient is agreeable to the plan, he is asking for pain medication, when he is told that he is not getting opiates he immediately asked for a GI cocktail, this does not sound gastrointestinal.   Troponin negative, chest x-ray negative, labs unremarkable, stable for discharge, doubt cardiac etiology or other severe or life-threatening etiology     Final Clinical Impression(s) / ED Diagnoses Final diagnoses:  Chest pain at rest    Rx / DC Orders ED Discharge Orders    None       Noemi Chapel, MD 08/10/20 1153    Noemi Chapel, MD 08/10/20 1347

## 2020-08-10 NOTE — Discharge Instructions (Signed)
Your family doctor needs to refer you to a cardiologist, your testing today is normal showing no signs of heart disease or heart attack.  ER for worsening symptoms.

## 2020-08-15 ENCOUNTER — Emergency Department (HOSPITAL_COMMUNITY): Payer: 59

## 2020-08-15 ENCOUNTER — Other Ambulatory Visit: Payer: Self-pay

## 2020-08-15 ENCOUNTER — Emergency Department (HOSPITAL_COMMUNITY)
Admission: EM | Admit: 2020-08-15 | Discharge: 2020-08-15 | Disposition: A | Discharge status: Home / Self Care | Payer: 59 | Attending: Emergency Medicine | Admitting: Emergency Medicine

## 2020-08-15 DIAGNOSIS — Z79899 Other long term (current) drug therapy: Secondary | ICD-10-CM | POA: Insufficient documentation

## 2020-08-15 DIAGNOSIS — Z91040 Latex allergy status: Secondary | ICD-10-CM | POA: Insufficient documentation

## 2020-08-15 DIAGNOSIS — I10 Essential (primary) hypertension: Secondary | ICD-10-CM | POA: Insufficient documentation

## 2020-08-15 DIAGNOSIS — J45909 Unspecified asthma, uncomplicated: Secondary | ICD-10-CM | POA: Diagnosis not present

## 2020-08-15 DIAGNOSIS — F17210 Nicotine dependence, cigarettes, uncomplicated: Secondary | ICD-10-CM | POA: Insufficient documentation

## 2020-08-15 DIAGNOSIS — Z7951 Long term (current) use of inhaled steroids: Secondary | ICD-10-CM | POA: Diagnosis not present

## 2020-08-15 DIAGNOSIS — R079 Chest pain, unspecified: Secondary | ICD-10-CM | POA: Diagnosis not present

## 2020-08-15 LAB — BASIC METABOLIC PANEL
Anion gap: 8 (ref 5–15)
BUN: 14 mg/dL (ref 6–20)
CO2: 21 mmol/L — ABNORMAL LOW (ref 22–32)
Calcium: 8.6 mg/dL — ABNORMAL LOW (ref 8.9–10.3)
Chloride: 106 mmol/L (ref 98–111)
Creatinine, Ser: 1.3 mg/dL — ABNORMAL HIGH (ref 0.61–1.24)
GFR, Estimated: >60 mL/min (ref >60)
Glucose, Bld: 93 mg/dL (ref 70–99)
Potassium: 4.6 mmol/L (ref 3.5–5.1)
Sodium: 135 mmol/L (ref 135–145)

## 2020-08-15 LAB — CBC
HCT: 35.5 % — ABNORMAL LOW (ref 39.0–52.0)
Hemoglobin: 12 g/dL — ABNORMAL LOW (ref 13.0–17.0)
MCH: 34.6 pg — ABNORMAL HIGH (ref 26.0–34.0)
MCHC: 33.8 g/dL (ref 30.0–36.0)
MCV: 102.3 fL — ABNORMAL HIGH (ref 80.0–100.0)
Platelets: 198 10*3/uL (ref 150–400)
RBC: 3.47 MIL/uL — ABNORMAL LOW (ref 4.22–5.81)
RDW: 18.3 % — ABNORMAL HIGH (ref 11.5–15.5)
WBC: 7.4 10*3/uL (ref 4.0–10.5)
nRBC: 0 % (ref 0.0–0.2)

## 2020-08-15 LAB — TROPONIN I (HIGH SENSITIVITY)
Troponin I (High Sensitivity): 4 ng/L (ref <18)
Troponin I (High Sensitivity): 4 ng/L (ref <18)

## 2020-08-15 MED ORDER — ACETAMINOPHEN 325 MG PO TABS
650.0000 mg | ORAL_TABLET | Freq: Once | ORAL | Status: AC
  Administered 2020-08-15: 650 mg via ORAL
  Filled 2020-08-15: qty 2

## 2020-08-15 MED ORDER — LIDOCAINE VISCOUS HCL 2 % MT SOLN
15.0000 mL | Freq: Once | OROMUCOSAL | Status: AC
  Administered 2020-08-15: 15 mL via ORAL
  Filled 2020-08-15: qty 15

## 2020-08-15 MED ORDER — ALUM & MAG HYDROXIDE-SIMETH 200-200-20 MG/5ML PO SUSP
30.0000 mL | Freq: Once | ORAL | Status: AC
  Administered 2020-08-15: 30 mL via ORAL
  Filled 2020-08-15: qty 30

## 2020-08-15 NOTE — ED Triage Notes (Signed)
Brought in by [facility] from home c/o chest pain  stabbing center of chest radiating to right arm and dizziness.  Hx of WPW.

## 2020-08-15 NOTE — ED Notes (Signed)
Patient transported to X-ray 

## 2020-08-15 NOTE — ED Notes (Signed)
Pt keeps calling every 2 minutes, pt anxious and wanting to get narcotic pain medications. Pt states "ya'll are ignoring me, nothing's been done"  This RN told pt that his work up has already been started with vital signs, EKG, xray and blood work already in process and that he has to see a provider first before getting any meds. Told pt that provider is working with other patients care as well.   Vital signs stable, pt on the cardiac monitor, pulse ox and BP cuff. Call bell at bedside, side rails up.

## 2020-08-15 NOTE — ED Provider Notes (Signed)
[facility] EMERGENCY DEPARTMENT Provider Note   CSN: [id number] Arrival date & time: 08/15/20  0522     History Chief Complaint  Patient presents with  . Chest Pain    Jourdon Treyon Wymore is a 50 y.o. male.  HPI  HPI: A 50 year old patient presents for evaluation of chest pain. Initial onset of pain was approximately 1-3 hours ago. The patient's chest pain is sharp and is worse with exertion. The patient's chest pain is middle- or left-sided, is not well-localized, is not described as heaviness/pressure/tightness and does not radiate to the arms/jaw/neck. The patient does not complain of nausea and denies diaphoresis. The patient has smoked in the past 90 days. The patient has no history of stroke, has no history of peripheral artery disease, denies any history of treated diabetes, has no relevant family history of coronary artery disease (first degree relative at less than age 72), is not hypertensive, has no history of hypercholesterolemia and does not have an elevated BMI (>=30).   Past Medical History:  Diagnosis Date  . Alcoholism /alcohol abuse   . Anemia   . Anxiety   . Arthritis    "knees; arms; elbows" (03/26/2015)  . Asthma   . Bipolar disorder ([place])   . Chronic bronchitis ([place])   . Chronic lower back pain   . Chronic pancreatitis ([place])   . Cocaine abuse ([place])   . Depression   . Family history of adverse reaction to anesthesia    "grandmother gets confused"  . Femoral condyle fracture ([place]) 03/08/2014   left medial/notes 03/09/2014  . GERD (gastroesophageal reflux disease)   . H/O hiatal hernia   . H/O suicide attempt 10/2012  . High cholesterol   . History of blood transfusion 10/2012   "when I tried to commit suicide"  . History of stomach ulcers   . Hypertension   . Marijuana abuse, continuous   . Migraine    "a few times/year" (03/26/2015)  . Pneumonia 1990's X 3  . PTSD (post-traumatic stress disorder)   . Sickle cell trait ([place])    . WPW (Wolff-Parkinson-White syndrome)    [name] 03/06/2013    Patient Active Problem List   Diagnosis Date Noted  . AKI (acute kidney injury) ([place]) 11/13/2018  . Seizure ([place]) 11/13/2018  . Gastritis and gastroduodenitis   . Chest pain 01/08/2018  . GI bleed 11/24/2017  . Acute blood loss anemia 11/24/2017  . Atypical chest pain 11/24/2017  . Acute pancreatitis 09/28/2017  . Abdominal pain 05/27/2017  . Hematemesis 05/27/2017  . Tachycardia 03/18/2017  . Diarrhea 03/18/2017  . Acute on chronic pancreatitis ([place]) 12/17/2016  . Intractable nausea and vomiting 12/05/2016  . Verbally abusive behavior 12/05/2016  . Normocytic anemia 12/05/2016  . Alcohol use disorder, severe, dependence ([place]) 07/25/2016  . Cocaine use disorder, severe, dependence ([place]) 07/25/2016  . 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 Leukocytosis   . Hospital acquired PNA 05/20/2015  . Chronic pancreatitis ([place]) 05/18/2015  . Pseudocyst of pancreas 05/18/2015  . Polysubstance abuse (tobacco, cocaine, THC, and ETOH) 03/26/2015  . Alcohol-induced chronic pancreatitis ([place])   . Essential hypertension 02/06/2014  . Mood disorder ([place]) 02/06/2014  . Alcohol-induced acute pancreatitis 11/28/2013  . Pancreatic pseudocyst/cyst 11/25/2013  . Severe protein-calorie malnutrition ([place]) 10/10/2013  . Suicide attempt ([place]) 10/08/2013  . 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 TOBACCO ABUSE 03/23/2007    Past Surgical History:  Procedure Laterality Date  .  BIOPSY  11/25/2017   Procedure: BIOPSY;  Surgeon: Arta Silence, MD;  Location: [facility] ENDOSCOPY;  Service: Endoscopy;;  . BIOPSY  10/14/2018   Procedure: BIOPSY;  Surgeon: Arta Silence, MD;  Location: [facility];  Service: Endoscopy;;  . CARDIAC CATHETERIZATION    . CYST ENTEROSTOMY  01/02/2020   Procedure: CYST ASPIRATION;  Surgeon: Milus Banister, MD;  Location: [facility] ENDOSCOPY;   Service: Endoscopy;;  . ESOPHAGOGASTRODUODENOSCOPY (EGD) WITH PROPOFOL N/A 11/25/2017   Procedure: ESOPHAGOGASTRODUODENOSCOPY (EGD) WITH PROPOFOL;  Surgeon: Arta Silence, MD;  Location: [facility];  Service: Endoscopy;  Laterality: N/A;  . ESOPHAGOGASTRODUODENOSCOPY (EGD) WITH PROPOFOL Left 10/14/2018   Procedure: ESOPHAGOGASTRODUODENOSCOPY (EGD) WITH PROPOFOL;  Surgeon: Arta Silence, MD;  Location[facility] ENDOSCOPY;  Service: Endoscopy;  Laterality: Left;  . ESOPHAGOGASTRODUODENOSCOPY (EGD) WITH PROPOFOL N/A 11/14/2018   Procedure: ESOPHAGOGASTRODUODENOSCOPY (EGD) WITH PROPOFOL;  Surgeon: Laurence Spates, MD;  Location: [facility] ENDOSCOPY;  Service: Gastroenterology;  Laterality: N/A;  . ESOPHAGOGASTRODUODENOSCOPY (EGD) WITH PROPOFOL N/A 01/02/2020   Procedure: ESOPHAGOGASTRODUODENOSCOPY (EGD) WITH PROPOFOL;  Surgeon: Milus Banister, MD;  Location: [facility] ENDOSCOPY;  Service: Endoscopy;  Laterality: N/A;  . EUS N/A 01/02/2020   Procedure: UPPER ENDOSCOPIC ULTRASOUND (EUS) RADIAL;  Surgeon: Milus Banister, MD;  Location: [facility] ENDOSCOPY;  Service: Endoscopy;  Laterality: N/A;  . EYE SURGERY Left 1990's   "result of trauma"   . FACIAL FRACTURE SURGERY Left 1990's   "result of trauma"   . FRACTURE SURGERY    . HERNIA REPAIR    . LEFT HEART CATHETERIZATION WITH CORONARY ANGIOGRAM Right 03/07/2013   Procedure: LEFT HEART CATHETERIZATION WITH CORONARY ANGIOGRAM;  Surgeon: [name], MD;  Location: [facility] CATH LAB;  Service: Cardiovascular;  Laterality: Right;  . UMBILICAL HERNIA REPAIR    . UPPER GASTROINTESTINAL ENDOSCOPY         Family History  Problem Relation Age of Onset  . Hypertension Mother   . Cirrhosis Mother   . Alcoholism Mother   . Hypertension Father   . Melanoma Father   . Hypertension Other   . Coronary artery disease Other     Social History   Tobacco Use  . Smoking status: Current Every Day Smoker    Packs/day: 1.00    Years: 36.00    Pack years: 36.00    Types:  Cigarettes, E-cigarettes  . Smokeless tobacco: Never Used  Vaping Use  . Vaping Use: Former  Substance Use Topics  . Alcohol use: Yes    Comment: last drink 8 days ago  . Drug use: Yes    Types: Marijuana, Cocaine    Comment: daily marijuana use; last cocaine use about 3 months ago    Home Medications Prior to Admission medications   Medication Sig Start Date End Date Taking? Authorizing Provider  acetaminophen (TYLENOL) 500 MG tablet Take 2 tablets (1,000 mg total) by mouth every 8 (eight) hours as needed for mild pain. 08/29/19   Elodia Florence., MD  albuterol (VENTOLIN HFA) 108 (90 Base) MCG/ACT inhaler Inhale 2 puffs into the lungs every 4 (four) hours as needed for wheezing or shortness of breath. 11/12/19   [name], DO  Cyanocobalamin (VITAMIN B-12 PO) Take 1 tablet by mouth daily.    [provider]  fluticasone furoate-vilanterol (BREO ELLIPTA) 200-25 MCG/INH AEPB Inhale 1 puff into the lungs daily. 11/12/19   [name], DO  folic acid (FOLVITE) 1 MG tablet Take 1 tablet (1 mg total) by mouth daily. 11/26/17   Candiss Norse,  Margaree Mackintosh, MD  hydrALAZINE (APRESOLINE) 25 MG tablet Take 1 tablet (25 mg total) by mouth 3 (three) times daily. 03/27/20   [name], MD  lidocaine (XYLOCAINE) 2 % solution Use as directed 15 mLs in the mouth or throat every 6 (six) hours as needed for mouth pain. 05/25/20   [name], MD  lipase/protease/amylase (CREON) 36000 UNITS CPEP capsule Take 2 capsules (72,000 Units total) by mouth 3 (three) times daily with meals. For pancreatitis Patient taking differently: Take 72,000 Units by mouth 3 (three) times daily with meals. 11/05/19   [name], [name], MD  loperamide (IMODIUM A-D) 2 MG tablet Take 2 mg by mouth 4 (four) times daily as needed for diarrhea or loose stools.     [provider]  metoprolol tartrate (LOPRESSOR) 100 MG tablet Take 1 tablet (100 mg total) by mouth 2 (two) times daily. 05/23/20   Kerin Perna, NP  Multiple Vitamin (MULTIVITAMIN WITH MINERALS) TABS tablet Take 1 tablet by mouth daily. 09/06/17   Bonnell Public, MD  NASONEX 50 MCG/ACT nasal spray Place 2 sprays into the nose daily as needed (allergies).  06/19/19   [provider]  omeprazole (PRILOSEC) 40 MG capsule Take once to twice daily as needed 04/07/20   [name], [name], MD  QUEtiapine (SEROQUEL) 50 MG tablet Take 1 tablet (50 mg total) by mouth at bedtime. 03/27/20   [name], MD  sertraline (ZOLOFT) 100 MG tablet Take 1 tablet (100 mg total) by mouth daily. 03/27/20   [name], MD  sucralfate (CARAFATE) 1 g tablet Take 1 tablet (1 g total) by mouth 4 (four) times daily -  with meals and at bedtime. 05/23/20   Kerin Perna, NP  thiamine 100 MG tablet Take 1 tablet (100 mg total) by mouth daily. 10/16/18   [name], MD  amitriptyline (ELAVIL) 25 MG tablet Take 1 tablet (25 mg total) by mouth at bedtime. Patient not taking: Reported on 08/08/2019 10/15/18 08/08/19  [name], MD  gabapentin (NEURONTIN) 100 MG capsule Take 1 capsule (100 mg total) by mouth 2 (two) times daily. Patient not taking: Reported on 08/08/2019 10/15/18 08/08/19  [name], MD  pantoprazole (PROTONIX) 40 MG tablet Take 1 tablet (40 mg total) by mouth 2 (two) times daily. Patient not taking: Reported on 11/17/2019 11/05/19 03/10/20  Yetta Flock, MD  promethazine (PHENERGAN) 25 MG tablet Take 1 tablet (25 mg total) by mouth every 6 (six) hours as needed for nausea or vomiting. Patient not taking: Reported on 08/08/2019 03/02/19 08/08/19  Kinnie Feil, PA-C    Allergies    Robaxin [methocarbamol], Shellfish-derived products, Trazodone and nefazodone, Adhesive [tape], Latex, Toradol [ketorolac tromethamine], Contrast media [iodinated diagnostic agents], and Reglan [metoclopramide]  Review of Systems   Review of Systems  Physical Exam Updated Vital Signs BP (!) 136/95   Pulse 86   Temp 97.8 F  (36.6 C) (Oral)   Resp 18   Ht 1.727 m (5\' 8" )   Wt 54.4 kg   SpO2 96%   BMI 18.24 kg/m   Physical Exam  ED Results / Procedures / Treatments   Labs (all labs ordered are listed, but only abnormal results are displayed) Labs Reviewed  BASIC METABOLIC PANEL - Abnormal; Notable for the following components:      Result Value   CO2 21 (*)    Creatinine, Ser 1.30 (*)    Calcium 8.6 (*)    All other components  within normal limits  CBC - Abnormal; Notable for the following components:   RBC 3.47 (*)    Hemoglobin 12.0 (*)    HCT 35.5 (*)    MCV 102.3 (*)    MCH 34.6 (*)    RDW 18.3 (*)    All other components within normal limits  TROPONIN I (HIGH SENSITIVITY)  TROPONIN I (HIGH SENSITIVITY)    EKG EKG Interpretation  Date/Time:  Saturday August 15 2020 05:26:48 EDT Ventricular Rate:  91 PR Interval:  97 QRS Duration: 76 QT Interval:  295 QTC Calculation: 363 R Axis:   79 Text Interpretation: Sinus rhythm Short PR interval Biatrial enlargement Borderline repolarization abnormality When compared with ECG of 08/10/2020, No significant change was found Confirmed by Delora Fuel ([id number]) on 08/15/2020 5:44:16 AM   Radiology DG Chest 2 View  Result Date: 08/15/2020 CLINICAL DATA:  50 year old male with history of central chest pain radiating into the right arm. Dizziness. EXAM: CHEST - 2 VIEW COMPARISON:  Chest x-Jett Fukuda 08/10/2020. FINDINGS: Lung volumes are normal. No consolidative airspace disease. No pleural effusions. No pneumothorax. No pulmonary nodule or mass noted. Pulmonary vasculature and the cardiomediastinal silhouette are within normal limits. IMPRESSION: No radiographic evidence of acute cardiopulmonary disease. Electronically Signed   By: Vinnie Langton M.D.   On: 08/15/2020 06:29    Procedures Procedures   Medications Ordered in ED Medications  alum & mag hydroxide-simeth (MAALOX/MYLANTA) 200-200-20 MG/5ML suspension 30 mL (has no administration in time range)     And  lidocaine (XYLOCAINE) 2 % viscous mouth solution 15 mL (has no administration in time range)    ED Course  I have reviewed the triage vital signs and the nursing notes.  Pertinent labs & imaging results that were available during my care of the patient were reviewed by me and considered in my medical decision making (see chart for details).    MDM Rules/Calculators/A&P HEAR Score: 63                       50 year old man well-known to this department presents today complaining of left-sided chest pain. Differential diagnosis includes chest wall pain, musculoskeletal disorder, acute coronary syndrome, PE, diseases of the great vessels including dissection, upper GI symptoms including gastritis, pneumothoraces or other acute pulmonary etiologies.  Patient with EKG without changes, troponin and repeat troponin normal which clears patient by hear score for MI and I have a low index of suspicion for acute coronary syndrome.  Chest x-Gretchen Weinfeld reveals no evidence of acute intrapulmonary abnormality, no pneumothorax, pleural effusions, consolidative disease, vasculature and cardiomediastinal silhouette are within normal limits making dissection or aneurysm less likely.  Patient upper abdomen is soft and nontender.  Although patient with no definitive diagnosis, he has multiple and frequent evaluations for chest pain and other complaints.  He appears stable for discharge at this time and is advised of return precautions and need for follow-up. Final Clinical Impression(s) / ED Diagnoses Final diagnoses:  Nonspecific chest pain    Rx / DC Orders ED Discharge Orders    None       Pattricia Boss, MD 08/15/20 [phone]

## 2020-08-23 IMAGING — DX DG CHEST 2V
2 series · 2 of 2 positions shown · non-contrast
Comparison: 02/07/2018

CLINICAL DATA: Pt reports diffuse abdomen and chest pain x 45 mins;
pt describes pain as stabbing; pt with hx of pancreatitis; smoker;
hx of WPW syndrome, HTN, and PNA

EXAM:
CHEST - 2 VIEW

[w chest pa]
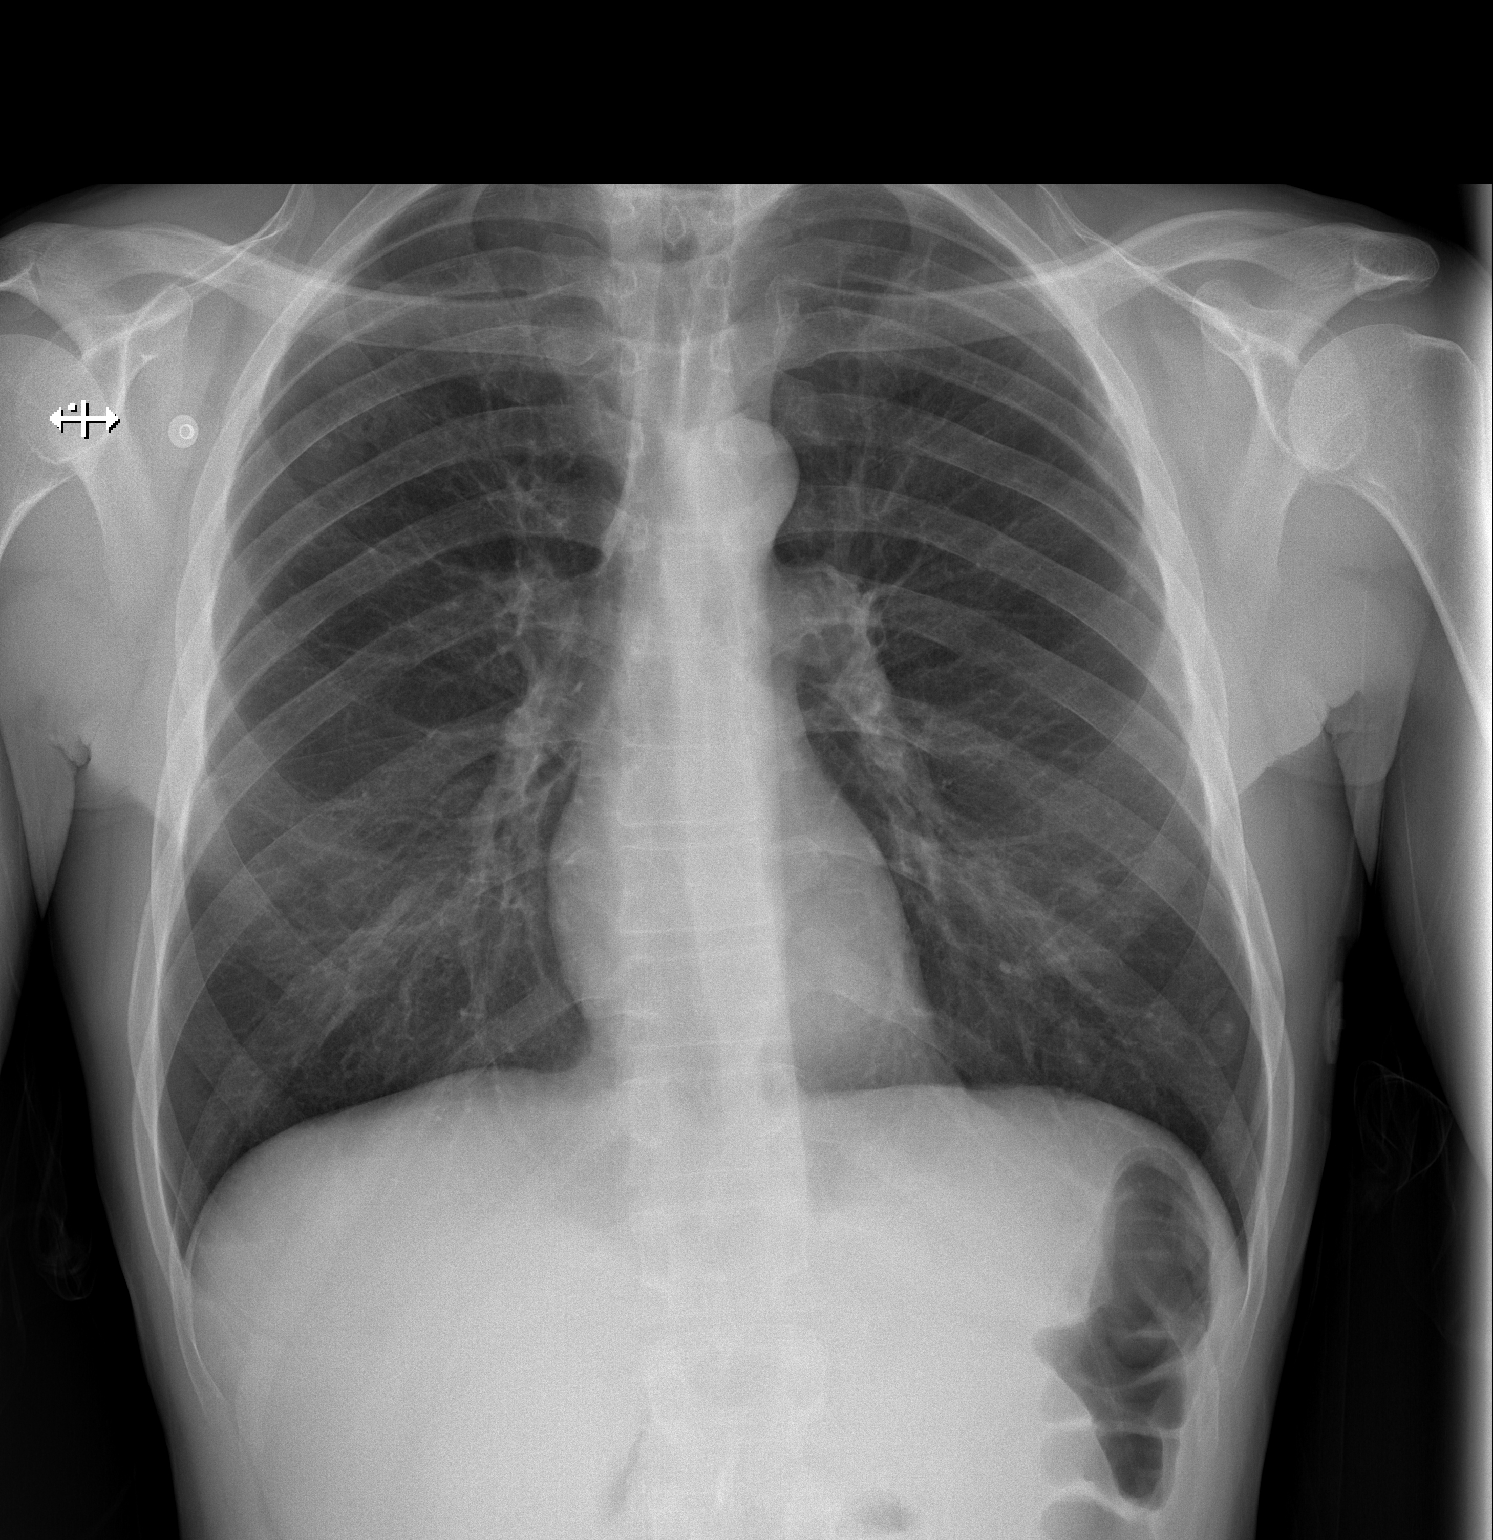

[w chest lat]
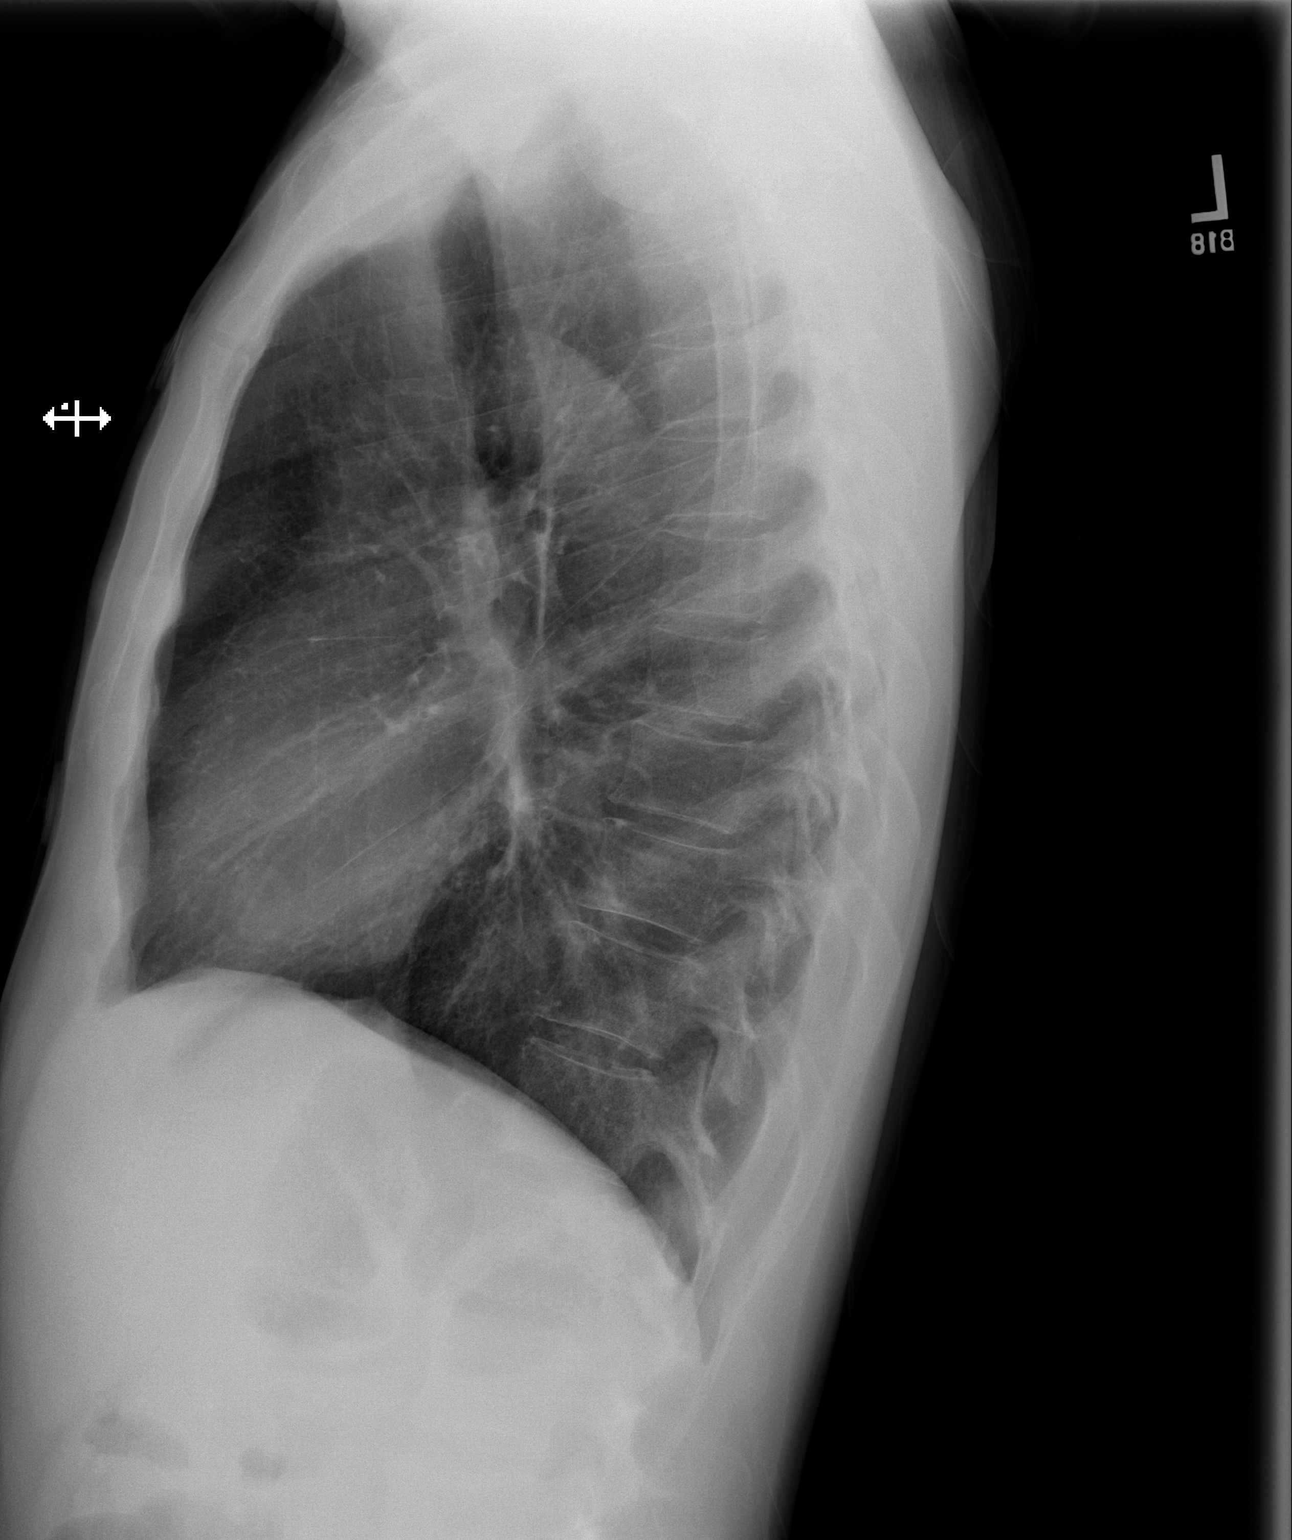

[2 of 2 positions shown; findings below may reference images not displayed]

FINDINGS: The heart size and mediastinal contours are within normal limits.
Both lungs are clear. No pleural effusion or pneumothorax. The
visualized skeletal structures are unremarkable.
IMPRESSION: Normal chest radiographs.
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
## 2020-09-06 IMAGING — CR DG CHEST 2V
2 series · 2 of 2 positions shown · non-contrast
Comparison: Chest x-ray dated 03/03/2018.

CLINICAL DATA: Chest tightness and pain for 2 weeks, shortness of
breath with exertion.

EXAM:
CHEST - 2 VIEW

[chest pa]
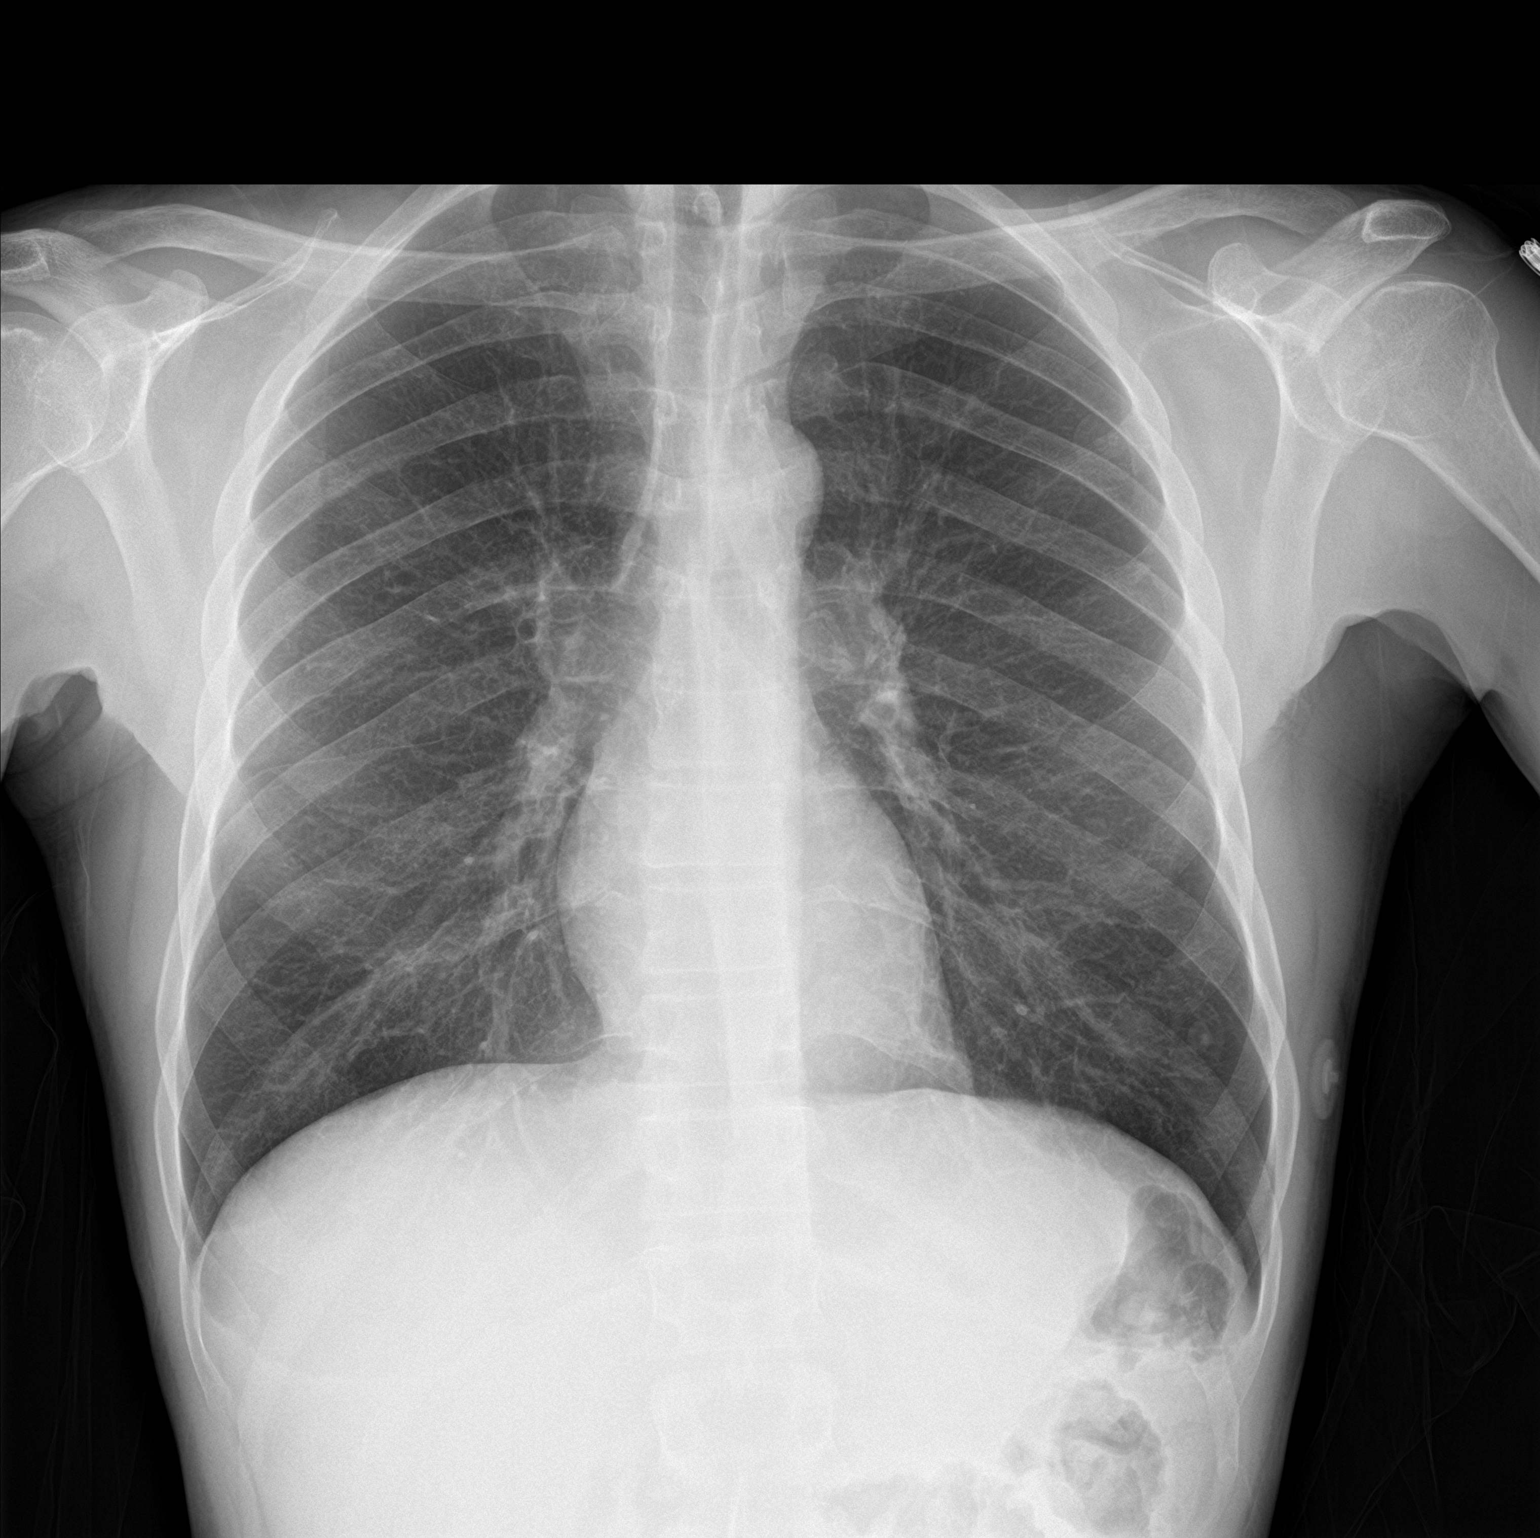

[chest lat]
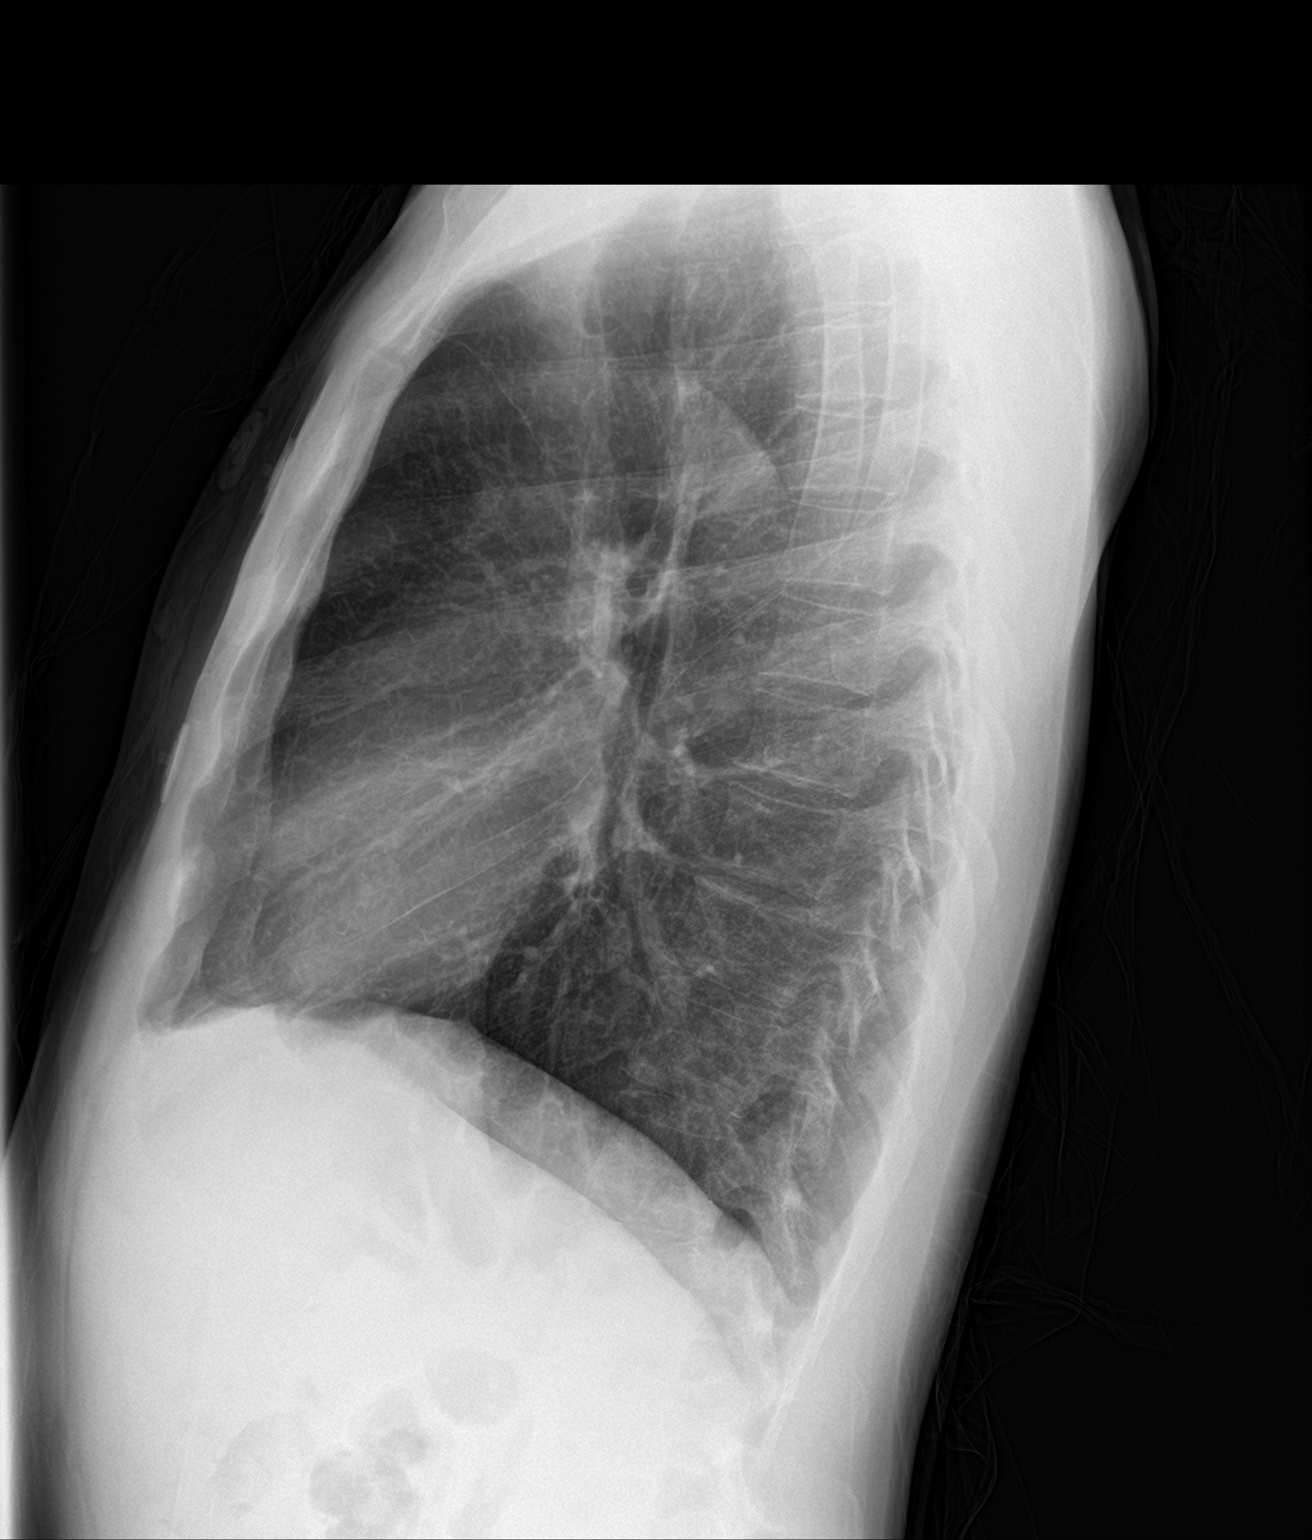

[2 of 2 positions shown; findings below may reference images not displayed]

FINDINGS: The heart size and mediastinal contours are within normal limits.
Both lungs are clear. The visualized skeletal structures are
unremarkable.
IMPRESSION: No active cardiopulmonary disease. No evidence of pneumonia or
pulmonary edema.

## 2020-09-12 IMAGING — CR DG CHEST 2V
2 series · 2 of 2 positions shown · non-contrast
Comparison: 03/17/2018

CLINICAL DATA: Central chest pain

EXAM:
CHEST - 2 VIEW

[chest pa]
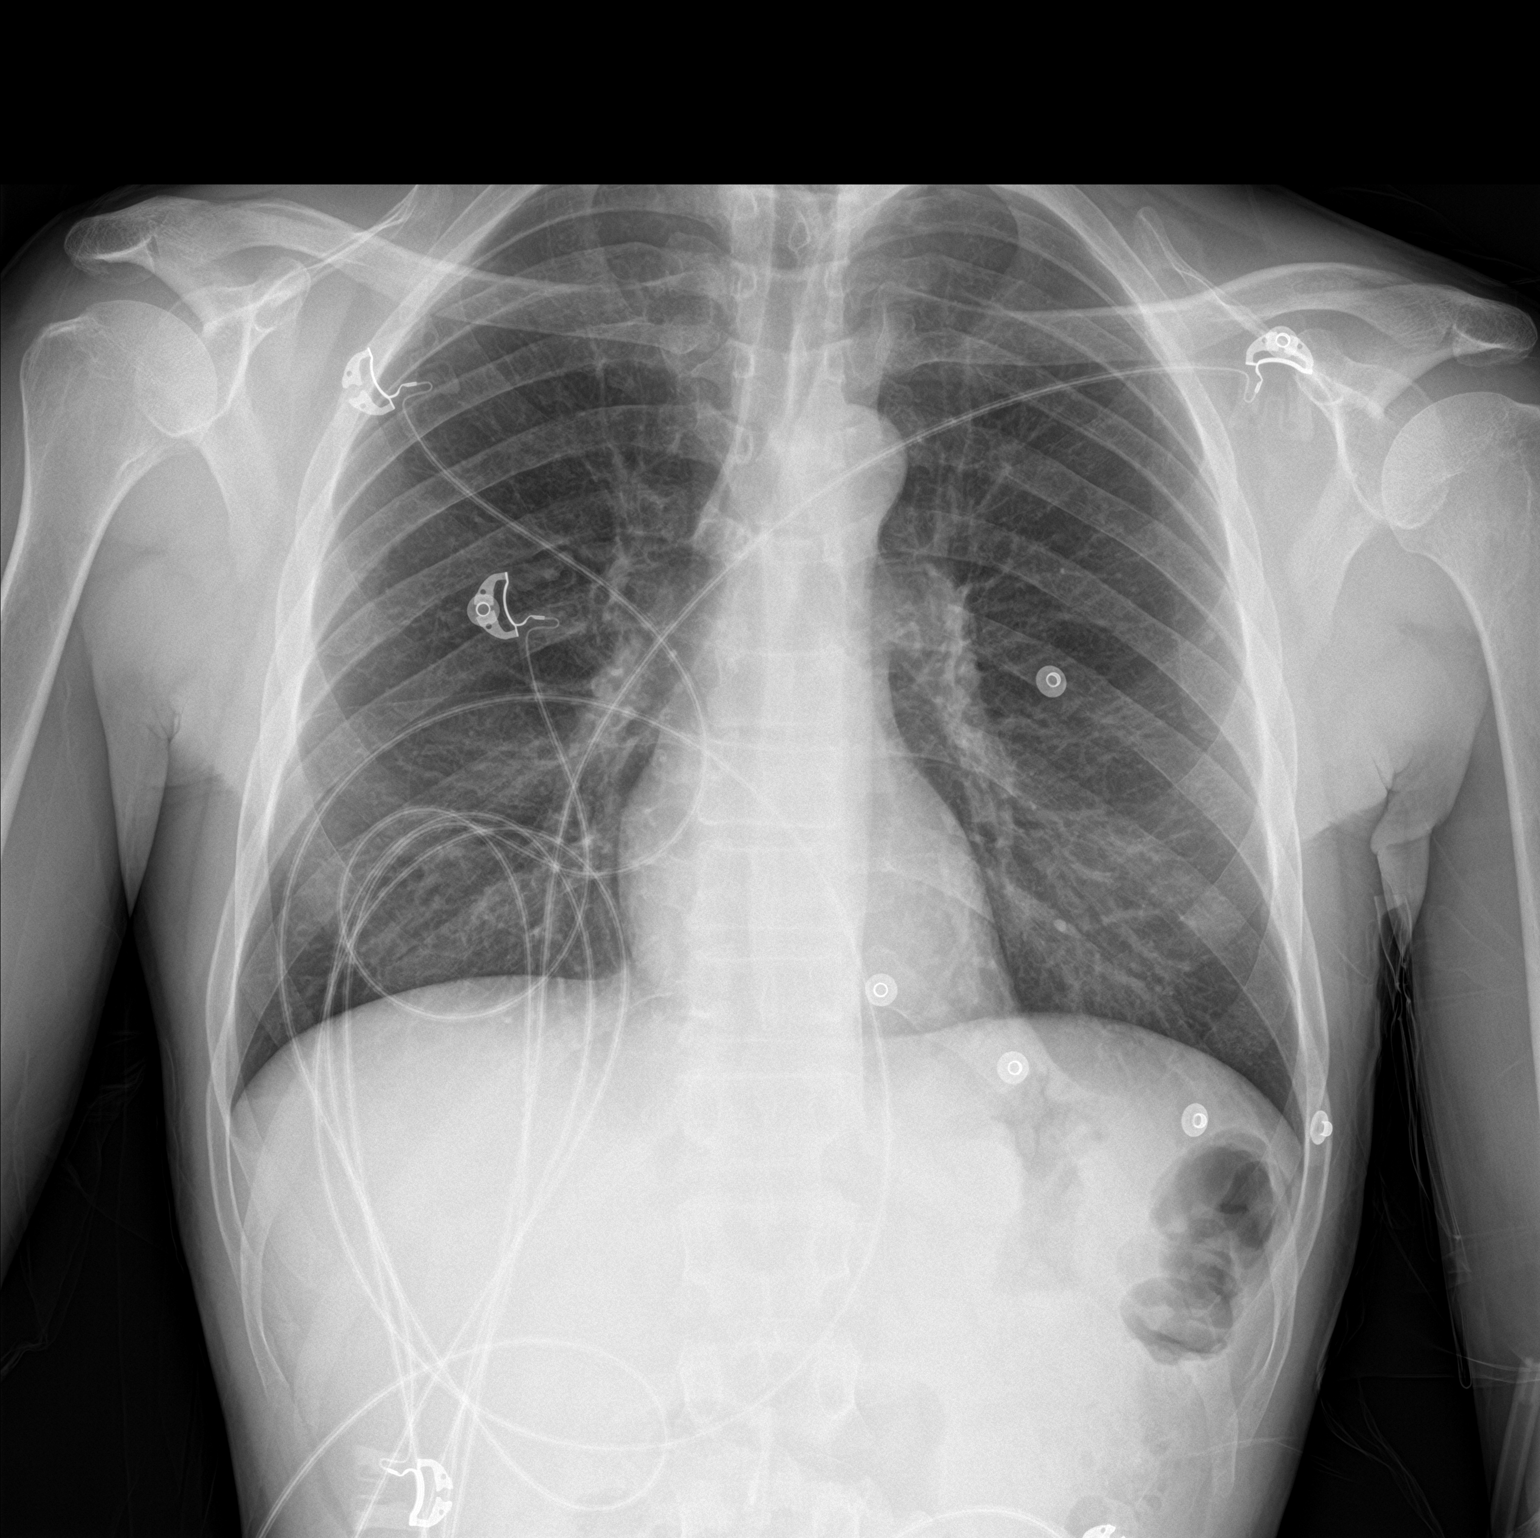

[chest lat]
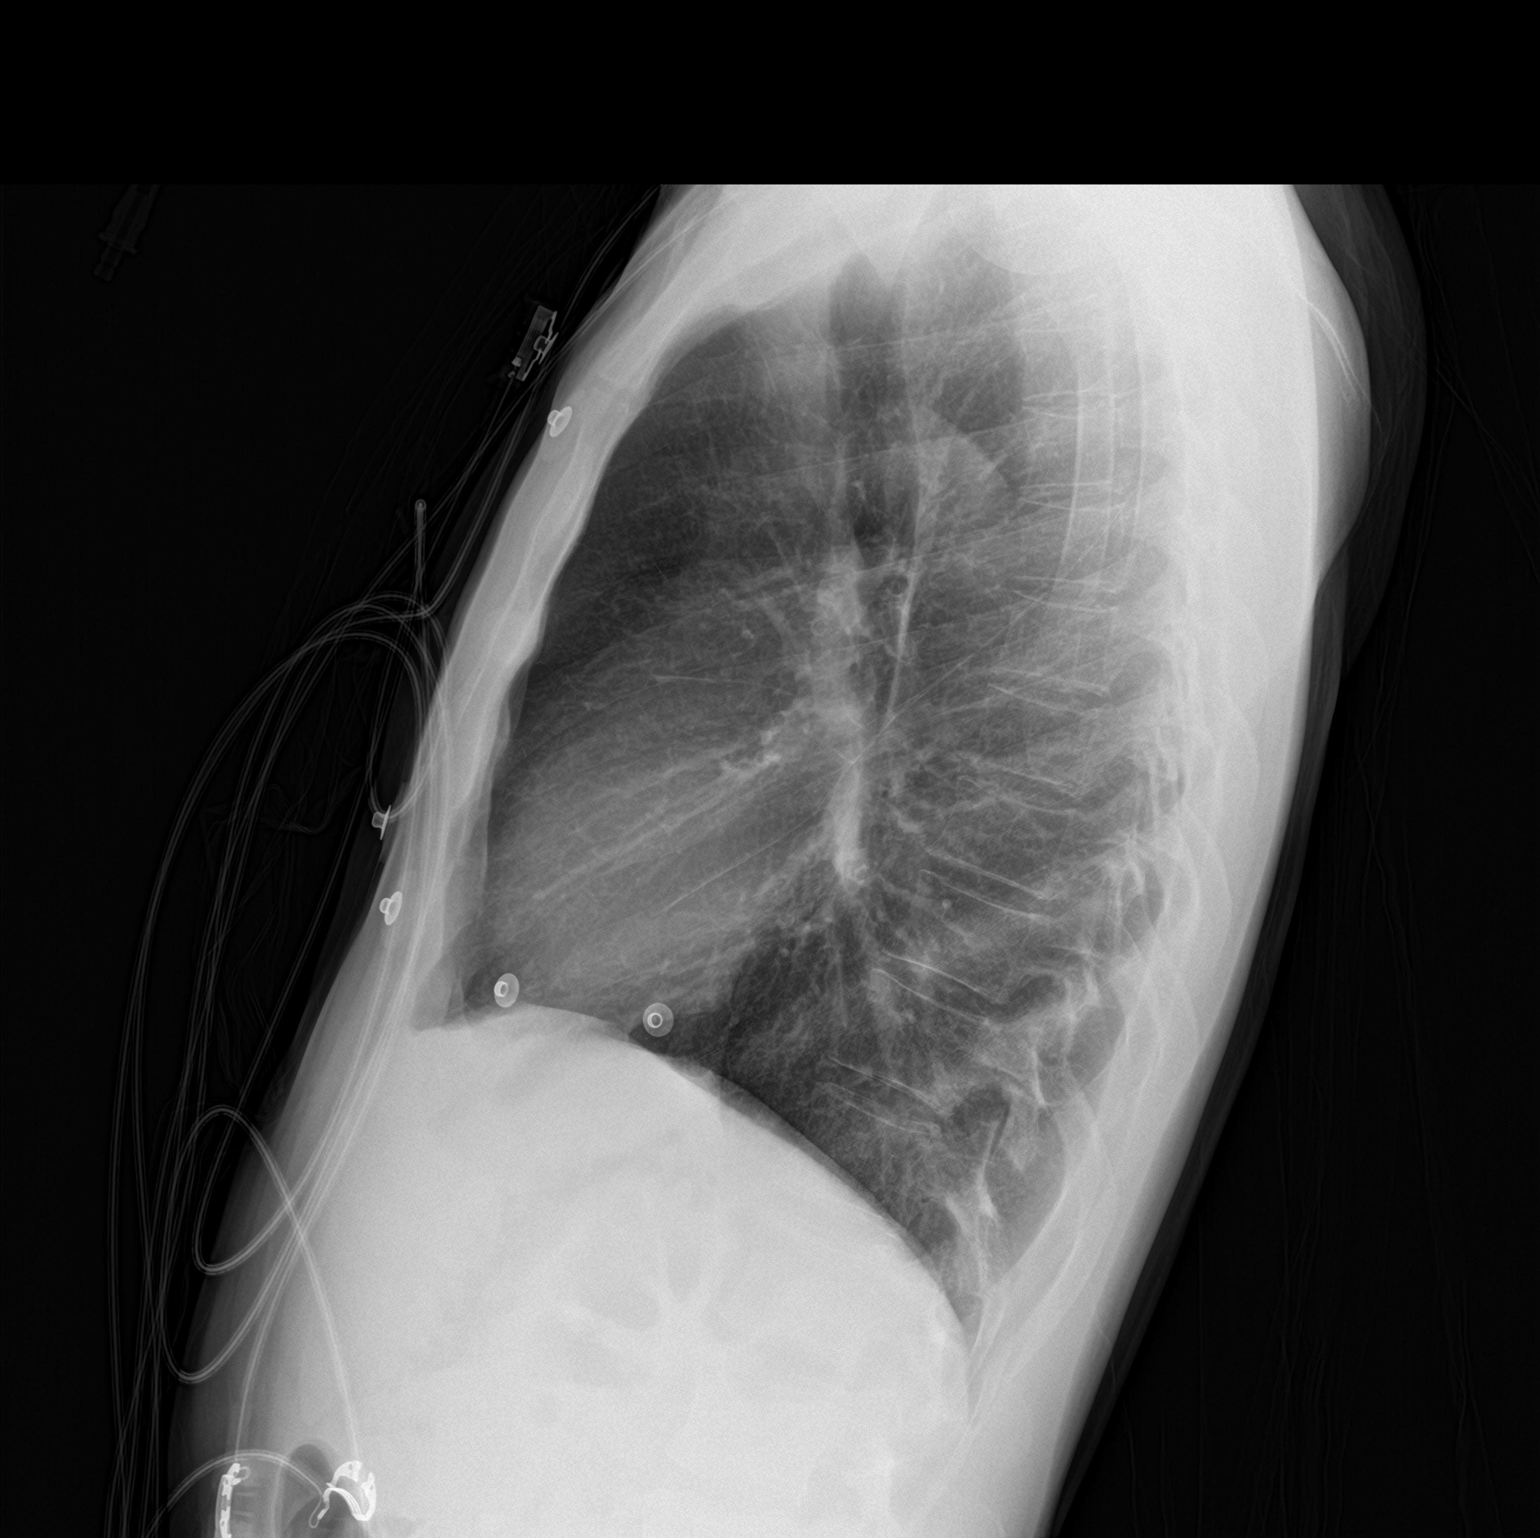

[2 of 2 positions shown; findings below may reference images not displayed]

FINDINGS: Lungs are clear.  No pleural effusion or pneumothorax.

The heart is normal in size.

Visualized osseous structures are within normal limits.
IMPRESSION: Normal chest radiographs.

## 2020-09-26 IMAGING — CR DG CHEST 2V
2 series · 2 of 2 positions shown · non-contrast
Comparison: March 23, 2018

CLINICAL DATA: Chest pain.  History of pancreatitis

EXAM:
CHEST - 2 VIEW

[chest pa]
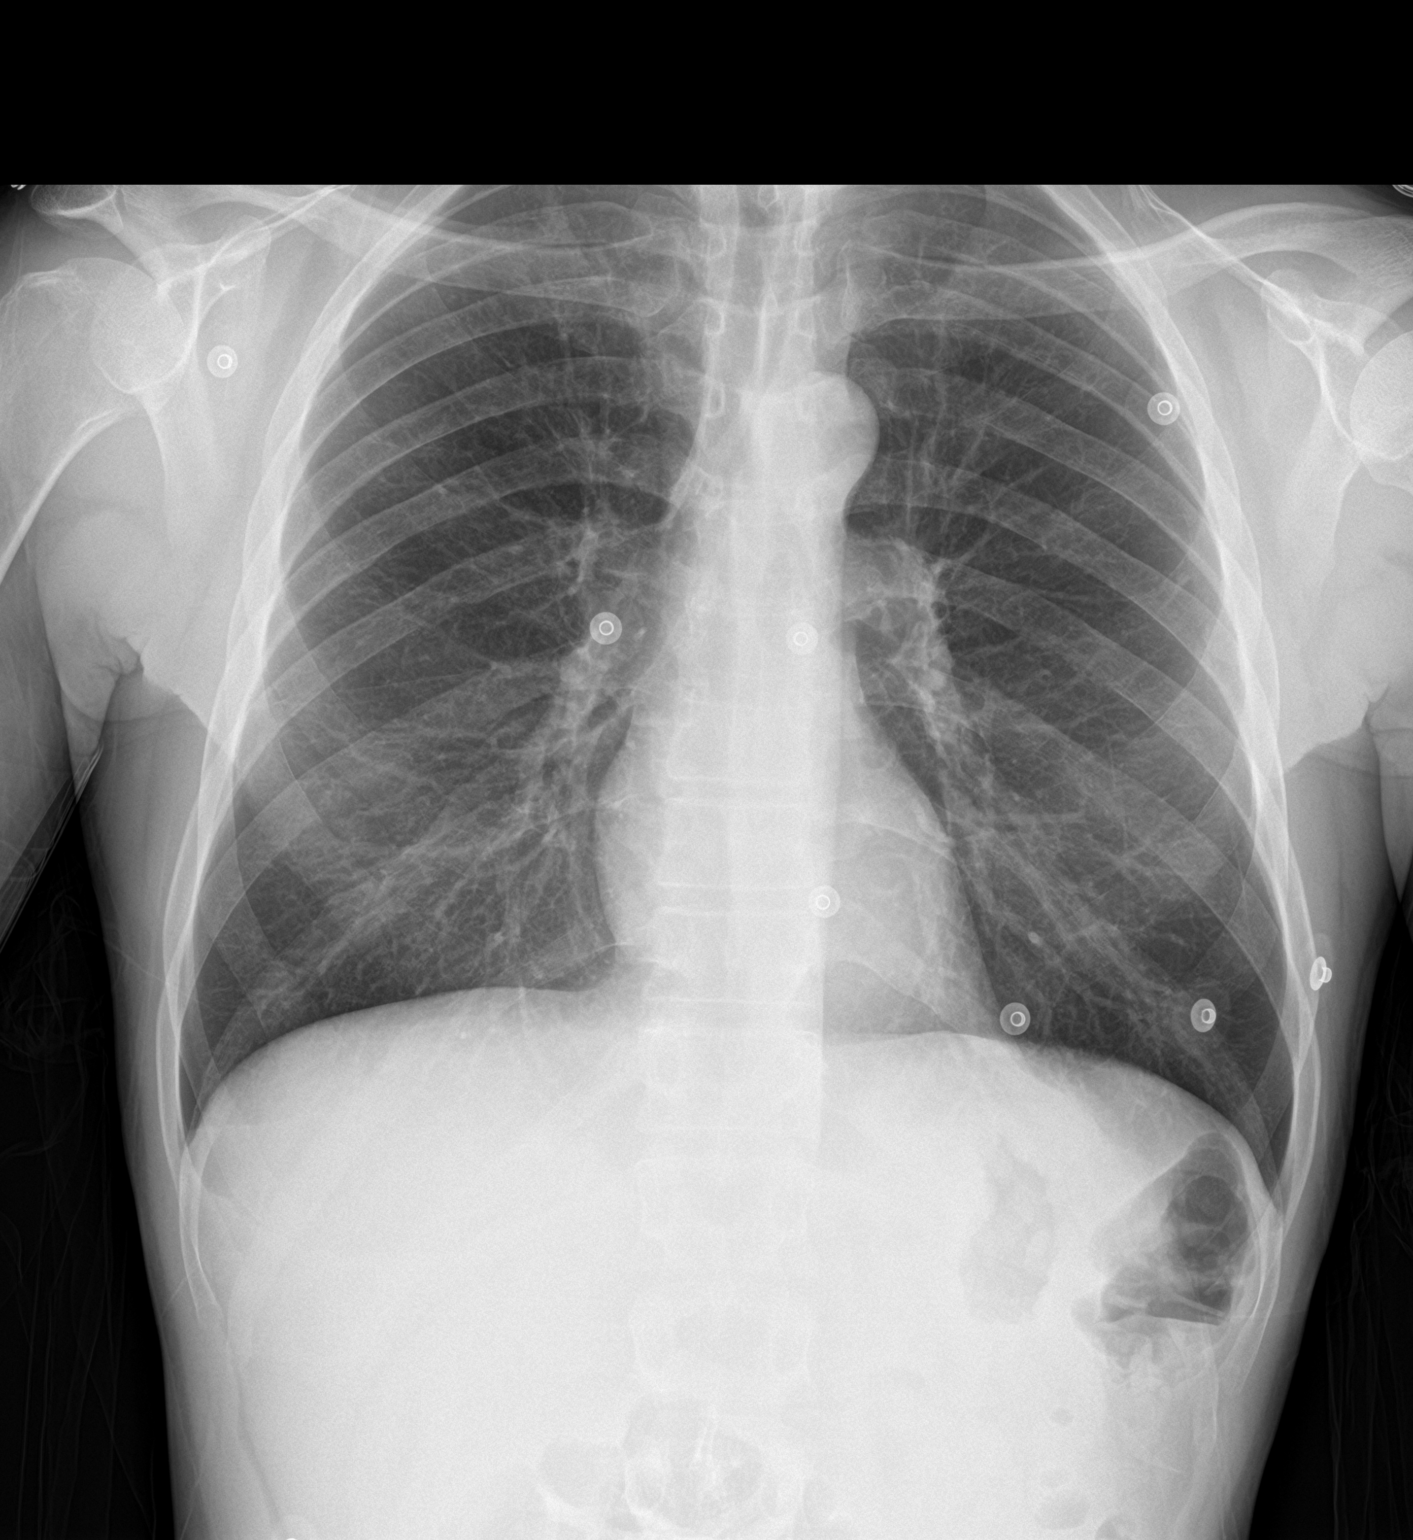

[chest lat]
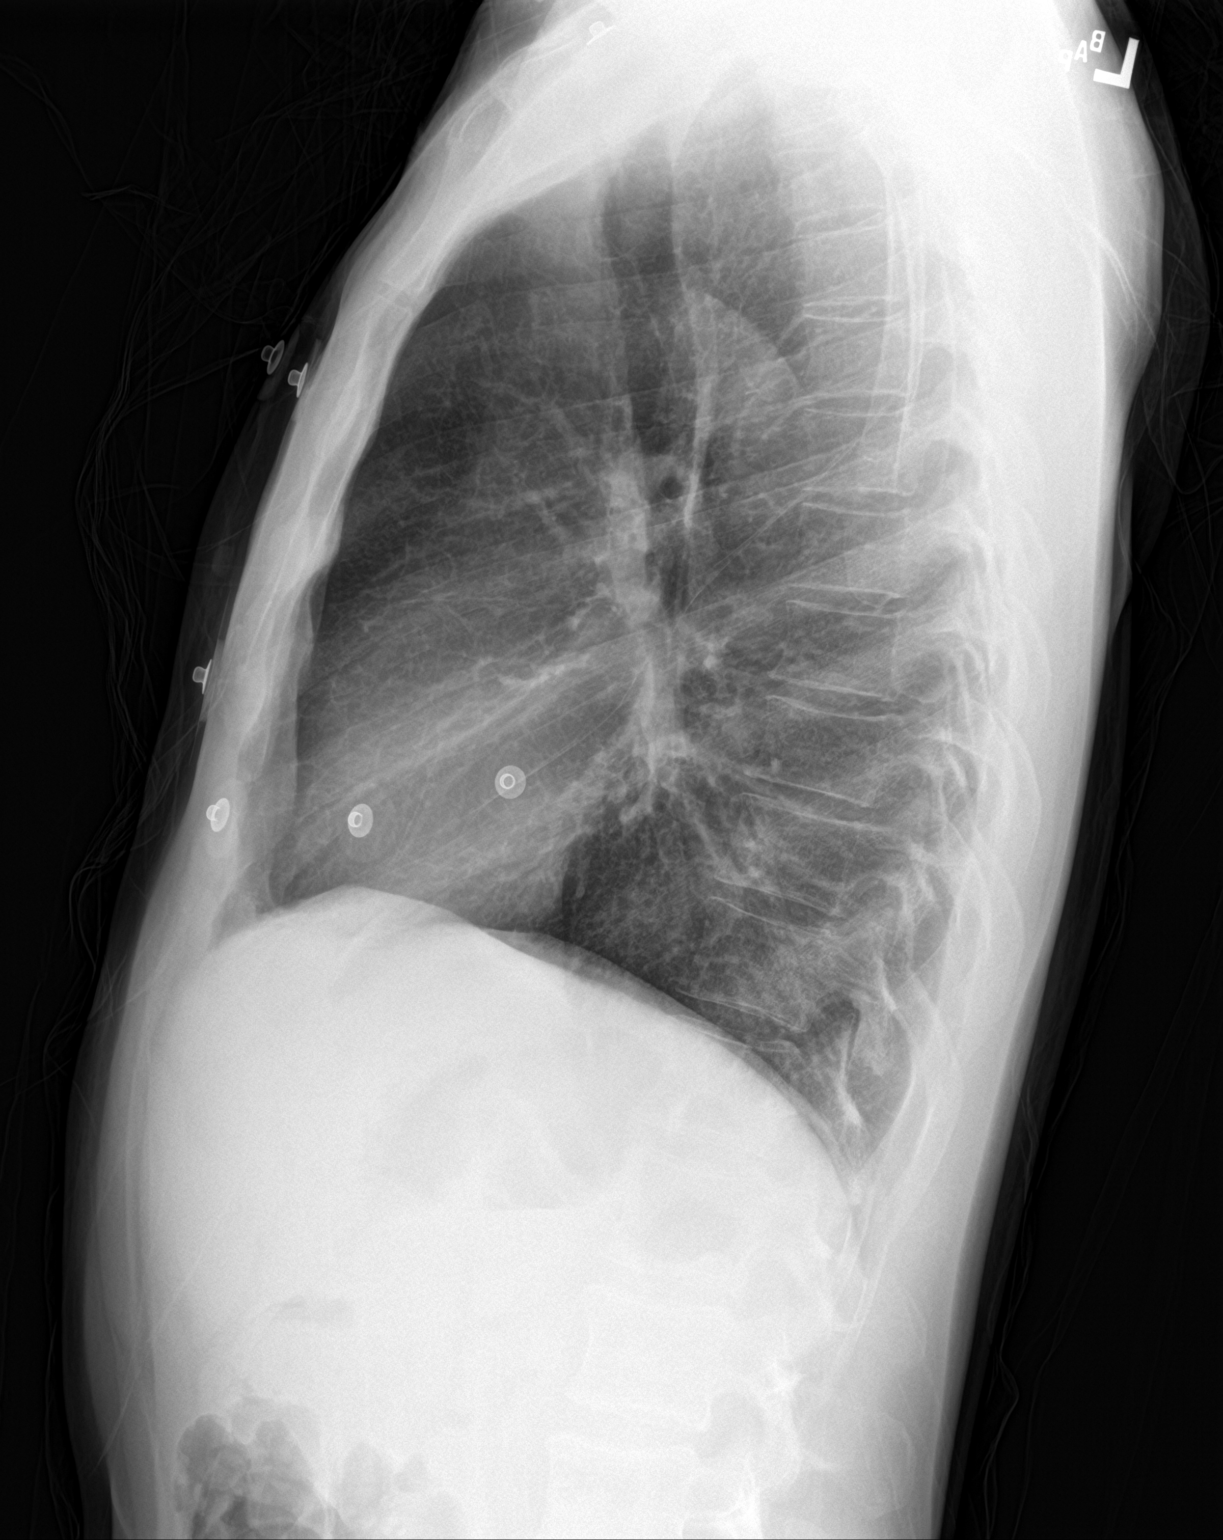

[2 of 2 positions shown; findings below may reference images not displayed]

FINDINGS: The lungs are clear. The heart size and pulmonary vascularity are
normal. No adenopathy. No pneumothorax or pneumomediastinum. No bone
lesions.
IMPRESSION: No edema or consolidation.

## 2020-10-03 DIAGNOSIS — R079 Chest pain, unspecified: Secondary | ICD-10-CM | POA: Insufficient documentation

## 2020-10-03 DIAGNOSIS — F17210 Nicotine dependence, cigarettes, uncomplicated: Secondary | ICD-10-CM | POA: Insufficient documentation

## 2020-10-03 DIAGNOSIS — Z7951 Long term (current) use of inhaled steroids: Secondary | ICD-10-CM | POA: Insufficient documentation

## 2020-10-03 DIAGNOSIS — Z79899 Other long term (current) drug therapy: Secondary | ICD-10-CM | POA: Diagnosis not present

## 2020-10-03 DIAGNOSIS — Z91040 Latex allergy status: Secondary | ICD-10-CM | POA: Insufficient documentation

## 2020-10-03 DIAGNOSIS — J45909 Unspecified asthma, uncomplicated: Secondary | ICD-10-CM | POA: Insufficient documentation

## 2020-10-03 DIAGNOSIS — I10 Essential (primary) hypertension: Secondary | ICD-10-CM | POA: Insufficient documentation

## 2020-10-04 ENCOUNTER — Emergency Department (HOSPITAL_COMMUNITY): Payer: 59

## 2020-10-04 ENCOUNTER — Encounter (HOSPITAL_COMMUNITY): Payer: Self-pay | Admitting: Emergency Medicine

## 2020-10-04 ENCOUNTER — Emergency Department (HOSPITAL_COMMUNITY)
Admission: EM | Admit: 2020-10-04 | Discharge: 2020-10-04 | Disposition: A | Discharge status: Home / Self Care | Payer: 59 | Attending: Emergency Medicine | Admitting: Emergency Medicine

## 2020-10-04 ENCOUNTER — Other Ambulatory Visit: Payer: Self-pay

## 2020-10-04 DIAGNOSIS — R072 Precordial pain: Secondary | ICD-10-CM

## 2020-10-04 LAB — CBC
HCT: 34 % — ABNORMAL LOW (ref 39.0–52.0)
Hemoglobin: 10.9 g/dL — ABNORMAL LOW (ref 13.0–17.0)
MCH: 33.6 pg (ref 26.0–34.0)
MCHC: 32.1 g/dL (ref 30.0–36.0)
MCV: 104.9 fL — ABNORMAL HIGH (ref 80.0–100.0)
Platelets: 167 10*3/uL (ref 150–400)
RBC: 3.24 MIL/uL — ABNORMAL LOW (ref 4.22–5.81)
RDW: 14.7 % (ref 11.5–15.5)
WBC: 8.8 10*3/uL (ref 4.0–10.5)
nRBC: 0 % (ref 0.0–0.2)

## 2020-10-04 LAB — BASIC METABOLIC PANEL
Anion gap: 12 (ref 5–15)
BUN: 8 mg/dL (ref 6–20)
CO2: 18 mmol/L — ABNORMAL LOW (ref 22–32)
Calcium: 8.5 mg/dL — ABNORMAL LOW (ref 8.9–10.3)
Chloride: 106 mmol/L (ref 98–111)
Creatinine, Ser: 0.81 mg/dL (ref 0.61–1.24)
GFR, Estimated: >60 mL/min (ref >60)
Glucose, Bld: 81 mg/dL (ref 70–99)
Potassium: 3.4 mmol/L — ABNORMAL LOW (ref 3.5–5.1)
Sodium: 136 mmol/L (ref 135–145)

## 2020-10-04 LAB — PROTIME-INR
INR: 0.9 (ref 0.8–1.2)
Prothrombin Time: 12.5 seconds (ref 11.4–15.2)

## 2020-10-04 LAB — TROPONIN I (HIGH SENSITIVITY)
Troponin I (High Sensitivity): 4 ng/L (ref <18)
Troponin I (High Sensitivity): 5 ng/L (ref <18)

## 2020-10-04 NOTE — ED Notes (Signed)
Pt in hallway berating staff for [place] sandwich and cup of ice. Pt was asked multiple times to wait in room due to emergency occurring next to his room. Pt was extremely rude and pacing halls and wold not listen to staff when asked to sit in room due to [place] and pt privacy. Pt refused vital signs, but received d/c paperwork.
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
## 2020-10-04 NOTE — ED Provider Notes (Signed)
[facility] EMERGENCY DEPARTMENT Provider Note   CSN: [id number] Arrival date & time: 10/03/20  2355     History Chief Complaint  Patient presents with  . Chest Pain  . Dizziness    [name] is a 50 y.o. male.  The history is provided by the patient.  Chest Pain Pain location:  L chest Pain quality: aching   Pain radiates to:  Does not radiate Pain severity:  Moderate Onset quality:  Gradual Timing:  Constant Progression:  Unchanged Chronicity:  New Relieved by:  Nothing Worsened by:  Nothing Ineffective treatments:  None tried Associated symptoms: no abdominal pain, no altered mental status, no anorexia, no anxiety, no back pain, no claudication, no cough, no diaphoresis, no dizziness, no dysphagia, no fever, no headache, no lower extremity edema, no near-syncope, no numbness, no orthopnea, no palpitations, no PND, no vomiting and no weakness   Risk factors: male sex        Past Medical History:  Diagnosis Date  . Alcoholism /alcohol abuse   . Anemia   . Anxiety   . Arthritis    "knees; arms; elbows" (03/26/2015)  . Asthma   . Bipolar disorder ([place])   . Chronic bronchitis ([place])   . Chronic lower back pain   . Chronic pancreatitis ([place])   . Cocaine abuse ([place])   . Depression   . Family history of adverse reaction to anesthesia    "grandmother gets confused"  . Femoral condyle fracture ([place]) 03/08/2014   left medial/notes 03/09/2014  . GERD (gastroesophageal reflux disease)   . H/O hiatal hernia   . H/O suicide attempt 10/2012  . High cholesterol   . History of blood transfusion 10/2012   "when I tried to commit suicide"  . History of stomach ulcers   . Hypertension   . Marijuana abuse, continuous   . Migraine    "a few times/year" (03/26/2015)  . Pneumonia 1990's X 3  . PTSD (post-traumatic stress disorder)   . Sickle cell trait ([place])   . WPW (Wolff-Parkinson-White syndrome)    [name] 03/06/2013    Patient Active  Problem List   Diagnosis Date Noted  . AKI (acute kidney injury) ([place]) 11/13/2018  . Seizure ([place]) 11/13/2018  . Gastritis and gastroduodenitis   . Chest pain 01/08/2018  . GI bleed 11/24/2017  . Acute blood loss anemia 11/24/2017  . Atypical chest pain 11/24/2017  . Acute pancreatitis 09/28/2017  . Abdominal pain 05/27/2017  . Hematemesis 05/27/2017  . Tachycardia 03/18/2017  . Diarrhea 03/18/2017  . Acute on chronic pancreatitis ([place]) 12/17/2016  . Intractable nausea and vomiting 12/05/2016  . Verbally abusive behavior 12/05/2016  . Normocytic anemia 12/05/2016  . Alcohol use disorder, severe, dependence ([place]) 07/25/2016  . Cocaine use disorder, severe, dependence ([place]) 07/25/2016  . 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 Leukocytosis   . Hospital acquired PNA 05/20/2015  . Chronic pancreatitis ([place]) 05/18/2015  . Pseudocyst of pancreas 05/18/2015  . Polysubstance abuse (tobacco, cocaine, THC, and ETOH) 03/26/2015  . Alcohol-induced chronic pancreatitis ([place])   . Essential hypertension 02/06/2014  . Mood disorder ([place]) 02/06/2014  . Alcohol-induced acute pancreatitis 11/28/2013  . Pancreatic pseudocyst/cyst 11/25/2013  . Severe protein-calorie malnutrition ([place]) 10/10/2013  . Suicide attempt ([place]) 10/08/2013  . 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 TOBACCO ABUSE 03/23/2007    Past Surgical History:  Procedure Laterality Date  . BIOPSY  11/25/2017   Procedure:  BIOPSY;  Surgeon: Arta Silence, MD;  Location: [facility] ENDOSCOPY;  Service: Endoscopy;;  . BIOPSY  10/14/2018   Procedure: BIOPSY;  Surgeon: Arta Silence, MD;  Location: [facility];  Service: Endoscopy;;  . CARDIAC CATHETERIZATION    . CYST ENTEROSTOMY  01/02/2020   Procedure: CYST ASPIRATION;  Surgeon: Milus Banister, MD;  Location: [facility] ENDOSCOPY;  Service: Endoscopy;;  . ESOPHAGOGASTRODUODENOSCOPY (EGD) WITH PROPOFOL N/A  11/25/2017   Procedure: ESOPHAGOGASTRODUODENOSCOPY (EGD) WITH PROPOFOL;  Surgeon: Arta Silence, MD;  Location: [facility];  Service: Endoscopy;  Laterality: N/A;  . ESOPHAGOGASTRODUODENOSCOPY (EGD) WITH PROPOFOL Left 10/14/2018   Procedure: ESOPHAGOGASTRODUODENOSCOPY (EGD) WITH PROPOFOL;  Surgeon: Arta Silence, MD;  Location: [facility] ENDOSCOPY;  Service: Endoscopy;  Laterality: Left;  . ESOPHAGOGASTRODUODENOSCOPY (EGD) WITH PROPOFOL N/A 11/14/2018   Procedure: ESOPHAGOGASTRODUODENOSCOPY (EGD) WITH PROPOFOL;  Surgeon: Laurence Spates, MD;  Location: [facility] ENDOSCOPY;  Service: Gastroenterology;  Laterality: N/A;  . ESOPHAGOGASTRODUODENOSCOPY (EGD) WITH PROPOFOL N/A 01/02/2020   Procedure: ESOPHAGOGASTRODUODENOSCOPY (EGD) WITH PROPOFOL;  Surgeon: Milus Banister, MD;  Location: [facility] ENDOSCOPY;  Service: Endoscopy;  Laterality: N/A;  . EUS N/A 01/02/2020   Procedure: UPPER ENDOSCOPIC ULTRASOUND (EUS) RADIAL;  Surgeon: Milus Banister, MD;  Location: [facility] ENDOSCOPY;  Service: Endoscopy;  Laterality: N/A;  . EYE SURGERY Left 1990's   "result of trauma"   . FACIAL FRACTURE SURGERY Left 1990's   "result of trauma"   . FRACTURE SURGERY    . HERNIA REPAIR    . LEFT HEART CATHETERIZATION WITH CORONARY ANGIOGRAM Right 03/07/2013   Procedure: LEFT HEART CATHETERIZATION WITH CORONARY ANGIOGRAM;  Surgeon: [name], MD;  Location: [facility] CATH LAB;  Service: Cardiovascular;  Laterality: Right;  . UMBILICAL HERNIA REPAIR    . UPPER GASTROINTESTINAL ENDOSCOPY         Family History  Problem Relation Age of Onset  . Hypertension Mother   . Cirrhosis Mother   . Alcoholism Mother   . Hypertension Father   . Melanoma Father   . Hypertension Other   . Coronary artery disease Other     Social History   Tobacco Use  . Smoking status: Current Every Day Smoker    Packs/day: 1.00    Years: 36.00    Pack years: 36.00    Types: Cigarettes, E-cigarettes  . Smokeless tobacco: Never Used  Vaping Use  . Vaping  Use: Former  Substance Use Topics  . Alcohol use: Yes    Comment: last drink 8 days ago  . Drug use: Yes    Types: Marijuana, Cocaine    Comment: daily marijuana use; last cocaine use about 3 months ago    Home Medications Prior to Admission medications   Medication Sig Start Date End Date Taking? Authorizing Provider  acetaminophen (TYLENOL) 500 MG tablet Take 2 tablets (1,000 mg total) by mouth every 8 (eight) hours as needed for mild pain. 08/29/19   Elodia Florence., MD  albuterol (VENTOLIN HFA) 108 (90 Base) MCG/ACT inhaler Inhale 2 puffs into the lungs every 4 (four) hours as needed for wheezing or shortness of breath. 11/12/19   [name], DO  Cyanocobalamin (VITAMIN B-12 PO) Take 1 tablet by mouth daily.    [provider]  fluticasone furoate-vilanterol (BREO ELLIPTA) 200-25 MCG/INH AEPB Inhale 1 puff into the lungs daily. 11/12/19   [name], DO  folic acid (FOLVITE) 1 MG tablet Take 1 tablet (1 mg total) by mouth daily. 11/26/17   [name], MD  hydrALAZINE (APRESOLINE)  25 MG tablet Take 1 tablet (25 mg total) by mouth 3 (three) times daily. 03/27/20   [name], MD  lidocaine (XYLOCAINE) 2 % solution Use as directed 15 mLs in the mouth or throat every 6 (six) hours as needed for mouth pain. 05/25/20   [name], MD  lipase/protease/amylase (CREON) 36000 UNITS CPEP capsule Take 2 capsules (72,000 Units total) by mouth 3 (three) times daily with meals. For pancreatitis Patient taking differently: Take 72,000 Units by mouth 3 (three) times daily with meals. 11/05/19   [name], [name], MD  loperamide (IMODIUM A-D) 2 MG tablet Take 2 mg by mouth 4 (four) times daily as needed for diarrhea or loose stools.     [provider]  metoprolol tartrate (LOPRESSOR) 100 MG tablet Take 1 tablet (100 mg total) by mouth 2 (two) times daily. 05/23/20   Kerin Perna, NP  Multiple Vitamin (MULTIVITAMIN WITH MINERALS) TABS tablet Take 1 tablet  by mouth daily. 09/06/17   Bonnell Public, MD  NASONEX 50 MCG/ACT nasal spray Place 2 sprays into the nose daily as needed (allergies).  06/19/19   [provider]  omeprazole (PRILOSEC) 40 MG capsule Take once to twice daily as needed 04/07/20   [name], [name], MD  QUEtiapine (SEROQUEL) 50 MG tablet Take 1 tablet (50 mg total) by mouth at bedtime. 03/27/20   [name], MD  sertraline (ZOLOFT) 100 MG tablet Take 1 tablet (100 mg total) by mouth daily. 03/27/20   [name], MD  sucralfate (CARAFATE) 1 g tablet Take 1 tablet (1 g total) by mouth 4 (four) times daily -  with meals and at bedtime. 05/23/20   Kerin Perna, NP  thiamine 100 MG tablet Take 1 tablet (100 mg total) by mouth daily. 10/16/18   [name], MD  amitriptyline (ELAVIL) 25 MG tablet Take 1 tablet (25 mg total) by mouth at bedtime. Patient not taking: Reported on 08/08/2019 10/15/18 08/08/19  [name], MD  gabapentin (NEURONTIN) 100 MG capsule Take 1 capsule (100 mg total) by mouth 2 (two) times daily. Patient not taking: Reported on 08/08/2019 10/15/18 08/08/19  [name], MD  pantoprazole (PROTONIX) 40 MG tablet Take 1 tablet (40 mg total) by mouth 2 (two) times daily. Patient not taking: Reported on 11/17/2019 11/05/19 03/10/20  Yetta Flock, MD  promethazine (PHENERGAN) 25 MG tablet Take 1 tablet (25 mg total) by mouth every 6 (six) hours as needed for nausea or vomiting. Patient not taking: Reported on 08/08/2019 03/02/19 08/08/19  Kinnie Feil, PA-C    Allergies    Robaxin [methocarbamol], Shellfish-derived products, Trazodone and nefazodone, Adhesive [tape], Latex, Toradol [ketorolac tromethamine], Contrast media [iodinated diagnostic agents], and Reglan [metoclopramide]  Review of Systems   Review of Systems  Constitutional: Negative for diaphoresis and fever.  HENT: Negative for congestion and trouble swallowing.   Eyes: Negative for redness.  Respiratory: Negative for  cough.   Cardiovascular: Positive for chest pain. Negative for palpitations, orthopnea, claudication, leg swelling, PND and near-syncope.  Gastrointestinal: Negative for abdominal pain, anorexia and vomiting.  Genitourinary: Negative for difficulty urinating.  Musculoskeletal: Negative for back pain.  Skin: Negative for rash.  Neurological: Negative for dizziness, facial asymmetry, speech difficulty, weakness, numbness and headaches.  Psychiatric/Behavioral: Negative for agitation.  All other systems reviewed and are negative.   Physical Exam Updated Vital Signs BP (!) 133/95 (BP Location: Right Arm)   Pulse 82   Temp 98.5 F (36.9 C) (Oral)  Resp 20   Ht 5\' 8"  (1.727 m)   Wt 54.4 kg   SpO2 100%   BMI 18.24 kg/m   Physical Exam Vitals and nursing note reviewed.  Constitutional:      General: He is not in acute distress.    Appearance: Normal appearance.  HENT:     Head: Normocephalic and atraumatic.     Nose: Nose normal.  Eyes:     Conjunctiva/sclera: Conjunctivae normal.     Pupils: Pupils are equal, round, and reactive to light.  Cardiovascular:     Rate and Rhythm: Normal rate and regular rhythm.     Pulses: Normal pulses.     Heart sounds: Normal heart sounds.  Pulmonary:     Effort: Pulmonary effort is normal.     Breath sounds: Normal breath sounds.  Abdominal:     General: Abdomen is flat. Bowel sounds are normal.     Palpations: Abdomen is soft.     Tenderness: There is no abdominal tenderness. There is no guarding.  Musculoskeletal:        General: Normal range of motion.     Cervical back: Normal range of motion and neck supple.  Skin:    General: Skin is warm and dry.     Capillary Refill: Capillary refill takes less than 2 seconds.  Neurological:     General: No focal deficit present.     Mental Status: He is alert and oriented to person, place, and time.  Psychiatric:        Mood and Affect: Mood normal.        Behavior: Behavior normal.      ED Results / Procedures / Treatments   Labs (all labs ordered are listed, but only abnormal results are displayed) Results for orders placed or performed during the hospital encounter of 50/93/26  Basic metabolic panel  Result Value Ref Range   Sodium 136 135 - 145 mmol/L   Potassium 3.4 (L) 3.5 - 5.1 mmol/L   Chloride 106 98 - 111 mmol/L   CO2 18 (L) 22 - 32 mmol/L   Glucose, Bld 81 70 - 99 mg/dL   BUN 8 6 - 20 mg/dL   Creatinine, Ser 0.81 0.61 - 1.24 mg/dL   Calcium 8.5 (L) 8.9 - 10.3 mg/dL   GFR, Estimated >60 >60 mL/min   Anion gap 12 5 - 15  CBC  Result Value Ref Range   WBC 8.8 4.0 - 10.5 K/uL   RBC 3.24 (L) 4.22 - 5.81 MIL/uL   Hemoglobin 10.9 (L) 13.0 - 17.0 g/dL   HCT 34.0 (L) 39.0 - 52.0 %   MCV 104.9 (H) 80.0 - 100.0 fL   MCH 33.6 26.0 - 34.0 pg   MCHC 32.1 30.0 - 36.0 g/dL   RDW 14.7 11.5 - 15.5 %   Platelets 167 150 - 400 K/uL   nRBC 0.0 0.0 - 0.2 %  Protime-INR (order if Patient is taking Coumadin / Warfarin)  Result Value Ref Range   Prothrombin Time 12.5 11.4 - 15.2 seconds   INR 0.9 0.8 - 1.2  Troponin I (High Sensitivity)  Result Value Ref Range   Troponin I (High Sensitivity) 5 <18 ng/L  Troponin I (High Sensitivity)  Result Value Ref Range   Troponin I (High Sensitivity) 4 <18 ng/L   DG Chest 2 View  Result Date: 10/04/2020 CLINICAL DATA:  Gas leak in-house, dizziness and headaches, chest pain EXAM: CHEST - 2 VIEW COMPARISON:  08/15/2020 FINDINGS: The  heart size and mediastinal contours are within normal limits. Both lungs are clear. The visualized skeletal structures are unremarkable. IMPRESSION: No active cardiopulmonary disease. Electronically Signed   By: Randa Ngo M.D.   On: 10/04/2020 00:59    EKG EKG Interpretation  Date/Time:  Sunday Oct 04 2020 00:24:57 EDT Ventricular Rate:  70 PR Interval:  110 QRS Duration: 78 QT Interval:  360 QTC Calculation: 388 R Axis:   77 Text Interpretation: Sinus rhythm with sinus arrhythmia  with short PR Right atrial enlargement no change Confirmed by Randal Buba, Ilai Hiller ([id number]) on 10/04/2020 3:41:45 AM   Radiology DG Chest 2 View  Result Date: 10/04/2020 CLINICAL DATA:  Gas leak in-house, dizziness and headaches, chest pain EXAM: CHEST - 2 VIEW COMPARISON:  08/15/2020 FINDINGS: The heart size and mediastinal contours are within normal limits. Both lungs are clear. The visualized skeletal structures are unremarkable. IMPRESSION: No active cardiopulmonary disease. Electronically Signed   By: Randa Ngo M.D.   On: 10/04/2020 00:59    Procedures Procedures   Medications Ordered in ED Medications - No data to display  ED Course  I have reviewed the triage vital signs and the nursing notes.  Pertinent labs & imaging results that were available during my care of the patient were reviewed by me and considered in my medical decision making (see chart for details).    Ruled out for MI in the ED, [place] 0, highly doubt PE in this low risk patient  [name] was evaluated in Emergency Department on 10/04/2020 for the symptoms described in the history of present illness. He was evaluated in the context of the global COVID-19 pandemic, which necessitated consideration that the patient might be at risk for infection with the SARS-CoV-2 virus that causes COVID-19. Institutional protocols and algorithms that pertain to the evaluation of patients at risk for COVID-19 are in a state of rapid change based on information released by regulatory bodies including the CDC and federal and state organizations. These policies and algorithms were followed during the patient's care in the ED.  Final Clinical Impression(s) / ED Diagnoses Return for intractable cough, coughing up blood, fevers >100.4 unrelieved by medication, shortness of breath, intractable vomiting, chest pain, shortness of breath, weakness, numbness, changes in speech, facial asymmetry, abdominal pain, passing out, Inability to  tolerate liquids or food, cough, altered mental status or any concerns. No signs of systemic illness or infection. The patient is nontoxic-appearing on exam and vital signs are within normal limits.  I have reviewed the triage vital signs and the nursing notes. Pertinent labs & imaging results that were available during my care of the patient were reviewed by me and considered in my medical decision making (see chart for details). After history, exam, and medical workup I feel the patient has been appropriately medically screened and is safe for discharge home. Pertinent diagnoses were discussed with the patient. Patient was given return precautions.       Rebecca Motta, MD 10/04/20 Delrae Rend

## 2020-10-04 NOTE — ED Notes (Signed)
Pt has a history of neuropathy. Reports numbness to left arm has been occurring for the last 3 weeks.

## 2020-10-11 ENCOUNTER — Ambulatory Visit (HOSPITAL_COMMUNITY)
Admission: EM | Admit: 2020-10-11 | Discharge: 2020-10-11 | Disposition: A | Discharge status: Home / Self Care | Payer: Medicaid Other | Attending: Family | Admitting: Family

## 2020-10-11 ENCOUNTER — Other Ambulatory Visit: Payer: Self-pay

## 2020-10-11 DIAGNOSIS — F419 Anxiety disorder, unspecified: Secondary | ICD-10-CM

## 2020-10-11 DIAGNOSIS — F1020 Alcohol dependence, uncomplicated: Secondary | ICD-10-CM

## 2020-10-11 MED ORDER — QUETIAPINE FUMARATE 50 MG PO TABS: 50.0000 mg | ORAL_TABLET | Freq: Every day | ORAL | 0 refills | Status: DC

## 2020-10-11 NOTE — ED Notes (Signed)
Pt verbalized understanding of discharge instructions reviewed on AVS. Resources provided. Safety maintained.

## 2020-10-11 NOTE — Discharge Instructions (Addendum)
Patient is instructed prior to discharge to:  Take all medications as prescribed by his/her mental healthcare provider. Report any adverse effects and or reactions from the medicines to his/her outpatient provider promptly. Keep all scheduled appointments, to ensure that you are getting refills on time and to avoid any interruption in your medication.  If you are unable to keep an appointment call to reschedule.  Be sure to follow-up with resources and follow-up appointments provided.  Patient has been instructed & cautioned: To not engage in alcohol and or illegal drug use while on prescription medicines. In the event of worsening symptoms, patient is instructed to call the crisis hotline, 911 and or go to the nearest ED for appropriate evaluation and treatment of symptoms. To follow-up with his/her primary care provider for your other medical issues, concerns and or health care needs.    Substance Abuse Treatment Resources listed Below:  [facility] - Admissions are currently completed Monday through Friday at 8am; both appointments and walk-ins are accepted.  Any individual that is a [place] resident may present for a substance abuse screening and assessment for admission.  A person may be referred by numerous sources or self-refer.   Potential clients will be screened for medical necessity and appropriateness for the program.  Clients must meet criteria for high-intensity residential treatment services.  If clinically appropriate, a client will continue with the comprehensive clinical assessment and intake process, as well as enrollment in the [facility].  Address: [address] [place], [place] [address] Admin Hours: Mon-Fri 8AM to 5PM Center Hours: 24/7 Phone: [phone] Fax: [phone]  [facility] Address: [address], [place], [place] [address] Behavioral [facility] Urgent [facility] ([facility]) Hours: 24/7 Phone:  [phone] Fax: [phone]  [facility]): (offers outpatient therapy and intensive outpatient substance abuse therapy).  [address], [place], [place] [address] Phone: [phone]  [facility] [place]: Offers FREE recovery skills classes, support groups, 1:1 Peer Support, and Compeer Classes. [address], [place], [place] [address] Phone: [phone] (Call to complete intake).   [facility] [address] [place], [place] [address] Phone: [phone] ext [phone] [facility] provides food, shelter and other programs and services to the homeless men of [place] through our men's program.  By offering safe shelter, three meals a day, clean clothing, Biblical counseling, financial planning, vocational training, GED/education and employment assistance, we've helped mend the shattered lives of many homeless men since opening in 1974.  We have approximately 267 beds available, with a max of 312 beds including mats for emergency situations and currently house an average of 270 men a night.  Prospective Client Check-In Information Photo ID Required (State/ Out of State/ DOC) - if photo ID is not available, clients are required to have a printout of a police/sheriff's criminal history report. Help out with chores around the Mission. No sex offender of any type (pending, charged, registered and/or any other sex related offenses) will be permitted to check in. Must be willing to abide by all rules, regulations, and policies established by the [facility]. The following will be provided - shelter, food, clothing, and biblical counseling. If you or someone you know is in need of assistance at our men's shelter in [place], [place], please call [phone] ext. [phone].  [facility]-will provide timely access to mental health services for children and adolescents (4-17) and adults    presenting in a mental health crisis. The program is designed for those who need urgent Behavioral Health or Substance Use treatment and are not experiencing a medical crisis that would typically require an emergency room visit.    [address] [place], [place] [address] Phone: [phone] [email or web address]  [facility]: Phone#: [phone]  The [facility] SA Intensive Outpatient Program (SAIOP) means structured individual and group addiction activities and services that are provided at an outpatient program designed to assist adult and adolescent consumers to begin recovery and learn skills for recovery maintenance. The [facility] SAIOP program is offered at least 3 hours a day, 3 days a week.SAIOP services shall include a structured program consisting of, but not limited to, the following services: Individual counseling and support; Group counseling and support; Family counseling, training or support; Biochemical assays to identify recent drug use (e.g., urine drug screens); Strategies for relapse prevention to include community and social support systems in treatment; Life skills; Crisis contingency planning; Disease Management; and Treatment support activities that have been adapted or specifically designed for persons with physical disabilities, or persons with co-occurring disorders of mental illness and substance abuse/dependence or mental retardation/developmental disability and substance abuse/dependence. Phone: [phone]  Address:   [facility] [place] [place] [facility] will also offer the following outpatient services: (Monday through Friday 8am-5pm)   Partial Hospitalization Program (PHP) Substance Abuse Intensive Outpatient Program (SA-IOP) Group Therapy Medication Management Peer Living Room We also provide (24/7):  Assessments: Our mental health clinician and providers will conduct a focused mental health evaluation, assessing for immediate  safety concerns and further mental health needs. Referral: Our team will provide resources and help connect to community based mental health treatment, when indicated, including psychotherapy, psychiatry, and other specialized behavioral health or substance use disorder services (for those not already in treatment). Transitional Care: Our team providers in person bridging and/or telephonic follow-up during the patient's transition to outpatient services.  [facility] 24-Hour Call Center: [phone] Behavioral Health Crisis Line: [phone]  

## 2020-10-11 NOTE — ED Provider Notes (Signed)
Behavioral Health Urgent Care Medical Screening Exam  Patient Name: [name] MRN: [id number] Date of Evaluation: 10/11/20 Chief Complaint:   Diagnosis:  Final diagnoses:  Alcohol use disorder, severe, dependence ([place])    History of Present illness: [name] is a 50 y.o. male.  Patient presents voluntarily to [facility] behavioral health for walk-in assessment.  He was transported by police after calling mobile crisis for assistance with alcohol use disorder today.  [name] reports he is ready to stop using alcohol.  He states " I know it is killing me slowly, it is time for change."  He left his mother's home on Tuesday of this week after an argument with his brother stemming from his use of marijuana inside the home.  He reports he then went to a hotel where he used marijuana that he believes could have been tainted with fentanyl or PCP.    [name] is assessed by nurse practitioner.  He is alert and oriented, answers appropriately.  He is pleasant and cooperative during assessment.  He reports he would like substance use treatment for chronic alcohol and marijuana use.  Last use of alcohol today, last use of marijuana today.  He reports chronic cocaine use but has recently stopped using cocaine, last use of cocaine 3 months ago. He has a history of 7 years sober when involved in NA/AA and working with a sponsor.   [name] has been diagnosed with major depressive disorder, alcohol use disorder, and cocaine use disorder. He is compliant with current medications, zoloft 100mg  daily and seroquel 50mg  QHS. He is seeking outpatient psychiatry follow, currently medications are prescribed by pain management provider at [facility]. He has ran out of his Seroquel and reports visual hallucinations including shadow figures and flashing lights x4 months.  He denies suicidal and homicidal ideations.  He endorses 1 prior suicide attempt in 2015.  He denies auditory  hallucinations.  There is no evidence of delusional thought content and he denies symptoms of paranoia.Patient contracts verbally for safety with this Probation officer.   Patient offered support and encouragement.  He gives verbal consent to speak with his mother, Rajan Burgard phone number [phone].  Spoke with patient's mother who will pick up patient today as well as pick up his Seroquel.  She denies concern for patient safety and will assist patient with follow-up at substance use treatment.  Psychiatric Specialty Exam  Presentation  General Appearance:Appropriate for Environment; Casual  Eye Contact:Good  Speech:Clear and Coherent; Normal Rate  Speech Volume:Normal  Handedness:Right   Mood and Affect  Mood:Depressed  Affect:Congruent; Depressed   Thought Process  Thought Processes:Coherent; Goal Directed  Descriptions of Associations:Intact  Orientation:Full (Time, Place and Person)  Thought Content:Logical    Hallucinations:Visual flashes of light and shadow people  Ideas of Reference:None  Suicidal Thoughts:No  Homicidal Thoughts:No   Sensorium  Memory:Immediate Good; Recent Good; Remote Good  Judgment:Fair  Insight:Fair   Executive Functions  Concentration:Good  Attention Span:Good  Stow  Language:Good   Psychomotor Activity  Psychomotor Activity:Normal   Assets  Assets:Communication Skills; Desire for Improvement; Financial Resources/Insurance; Housing; Intimacy; Leisure Time; Social Support   Sleep  Sleep:Fair  Number of hours: No data recorded  No data recorded  Physical Exam: Physical Exam Vitals and nursing note reviewed.  Constitutional:      Appearance: He is well-developed.  HENT:     Head: Normocephalic.  Cardiovascular:     Rate and Rhythm: Normal rate.  Pulmonary:  Effort: Pulmonary effort is normal.  Neurological:     Mental Status: He is alert and oriented to person, place, and  time.  Psychiatric:        Attention and Perception: Attention normal. He perceives visual hallucinations.        Mood and Affect: Affect normal. Mood is depressed.        Speech: Speech normal.        Behavior: Behavior normal. Behavior is cooperative.        Thought Content: Thought content normal.        Cognition and Memory: Cognition and memory normal.        Judgment: Judgment normal.    Review of Systems  Constitutional: Negative.   HENT: Negative.   Eyes: Negative.   Respiratory: Negative.   Cardiovascular: Negative.   Gastrointestinal: Positive for abdominal pain.       Reports chronic abdominal pain related to recurrent pancreatitis.   Genitourinary: Negative.   Musculoskeletal: Negative.   Skin: Negative.   Neurological: Negative.   Endo/Heme/Allergies: Negative.   Psychiatric/Behavioral: Positive for depression, hallucinations and substance abuse.   Blood pressure 128/89, pulse 90, temperature 99 F (37.2 C), temperature source Oral, resp. rate 18, SpO2 100 %. There is no height or weight on file to calculate BMI.  Musculoskeletal: Strength & Muscle Tone: within normal limits Gait & Station: normal Patient leans: N/A   [place] [facility] Discharge Disposition for Follow up and Recommendations: Based on my evaluation the patient does not appear to have an emergency medical condition and can be discharged with resources and follow up care in outpatient services for Medication Management and Individual Therapy  Patient reviewed with Dr Serafina Mitchell. Follow up with outpatient psychiatry, resources provided.  Follow up with substance use treatment resources provided.   Lucky Rathke, FNP 10/11/2020, 3:57 PM

## 2020-10-23 ENCOUNTER — Inpatient Hospital Stay (HOSPITAL_COMMUNITY)
Admission: EM | Admit: 2020-10-23 | Discharge: 2020-10-29 | DRG: 438 | Disposition: A | Discharge status: Home / Self Care | Payer: 59 | Attending: Internal Medicine | Admitting: Internal Medicine

## 2020-10-23 ENCOUNTER — Other Ambulatory Visit: Payer: Self-pay

## 2020-10-23 ENCOUNTER — Observation Stay (HOSPITAL_COMMUNITY): Payer: 59

## 2020-10-23 ENCOUNTER — Encounter (HOSPITAL_COMMUNITY): Payer: Self-pay

## 2020-10-23 ENCOUNTER — Emergency Department (HOSPITAL_COMMUNITY): Payer: 59

## 2020-10-23 DIAGNOSIS — Z91013 Allergy to seafood: Secondary | ICD-10-CM

## 2020-10-23 DIAGNOSIS — Z79899 Other long term (current) drug therapy: Secondary | ICD-10-CM

## 2020-10-23 DIAGNOSIS — K863 Pseudocyst of pancreas: Secondary | ICD-10-CM | POA: Diagnosis present

## 2020-10-23 DIAGNOSIS — F319 Bipolar disorder, unspecified: Secondary | ICD-10-CM | POA: Diagnosis present

## 2020-10-23 DIAGNOSIS — K626 Ulcer of anus and rectum: Secondary | ICD-10-CM

## 2020-10-23 DIAGNOSIS — K219 Gastro-esophageal reflux disease without esophagitis: Secondary | ICD-10-CM | POA: Diagnosis present

## 2020-10-23 DIAGNOSIS — M1990 Unspecified osteoarthritis, unspecified site: Secondary | ICD-10-CM | POA: Diagnosis present

## 2020-10-23 DIAGNOSIS — F17210 Nicotine dependence, cigarettes, uncomplicated: Secondary | ICD-10-CM | POA: Diagnosis present

## 2020-10-23 DIAGNOSIS — K860 Alcohol-induced chronic pancreatitis: Secondary | ICD-10-CM | POA: Diagnosis not present

## 2020-10-23 DIAGNOSIS — Z888 Allergy status to other drugs, medicaments and biological substances status: Secondary | ICD-10-CM

## 2020-10-23 DIAGNOSIS — Z91040 Latex allergy status: Secondary | ICD-10-CM

## 2020-10-23 DIAGNOSIS — Z8249 Family history of ischemic heart disease and other diseases of the circulatory system: Secondary | ICD-10-CM

## 2020-10-23 DIAGNOSIS — Z91041 Radiographic dye allergy status: Secondary | ICD-10-CM

## 2020-10-23 DIAGNOSIS — Z811 Family history of alcohol abuse and dependence: Secondary | ICD-10-CM

## 2020-10-23 DIAGNOSIS — K449 Diaphragmatic hernia without obstruction or gangrene: Secondary | ICD-10-CM | POA: Diagnosis present

## 2020-10-23 DIAGNOSIS — Z8711 Personal history of peptic ulcer disease: Secondary | ICD-10-CM

## 2020-10-23 DIAGNOSIS — R0789 Other chest pain: Secondary | ICD-10-CM | POA: Diagnosis present

## 2020-10-23 DIAGNOSIS — I10 Essential (primary) hypertension: Secondary | ICD-10-CM | POA: Diagnosis present

## 2020-10-23 DIAGNOSIS — Z8701 Personal history of pneumonia (recurrent): Secondary | ICD-10-CM

## 2020-10-23 DIAGNOSIS — K5900 Constipation, unspecified: Secondary | ICD-10-CM | POA: Diagnosis not present

## 2020-10-23 DIAGNOSIS — F1120 Opioid dependence, uncomplicated: Secondary | ICD-10-CM | POA: Diagnosis present

## 2020-10-23 DIAGNOSIS — E559 Vitamin D deficiency, unspecified: Secondary | ICD-10-CM | POA: Diagnosis present

## 2020-10-23 DIAGNOSIS — E7800 Pure hypercholesterolemia, unspecified: Secondary | ICD-10-CM | POA: Diagnosis present

## 2020-10-23 DIAGNOSIS — D539 Nutritional anemia, unspecified: Secondary | ICD-10-CM | POA: Diagnosis present

## 2020-10-23 DIAGNOSIS — Z6372 Alcoholism and drug addiction in family: Secondary | ICD-10-CM

## 2020-10-23 DIAGNOSIS — Z86010 Personal history of colonic polyps: Secondary | ICD-10-CM

## 2020-10-23 DIAGNOSIS — K2950 Unspecified chronic gastritis without bleeding: Secondary | ICD-10-CM | POA: Diagnosis present

## 2020-10-23 DIAGNOSIS — Z5900 Homelessness unspecified: Secondary | ICD-10-CM

## 2020-10-23 DIAGNOSIS — Z7951 Long term (current) use of inhaled steroids: Secondary | ICD-10-CM

## 2020-10-23 DIAGNOSIS — J45909 Unspecified asthma, uncomplicated: Secondary | ICD-10-CM | POA: Diagnosis present

## 2020-10-23 DIAGNOSIS — D500 Iron deficiency anemia secondary to blood loss (chronic): Secondary | ICD-10-CM | POA: Diagnosis present

## 2020-10-23 DIAGNOSIS — R079 Chest pain, unspecified: Secondary | ICD-10-CM

## 2020-10-23 DIAGNOSIS — F10239 Alcohol dependence with withdrawal, unspecified: Secondary | ICD-10-CM | POA: Diagnosis present

## 2020-10-23 DIAGNOSIS — Z808 Family history of malignant neoplasm of other organs or systems: Secondary | ICD-10-CM

## 2020-10-23 DIAGNOSIS — D571 Sickle-cell disease without crisis: Secondary | ICD-10-CM | POA: Diagnosis present

## 2020-10-23 DIAGNOSIS — K2971 Gastritis, unspecified, with bleeding: Secondary | ICD-10-CM | POA: Diagnosis present

## 2020-10-23 DIAGNOSIS — Z20822 Contact with and (suspected) exposure to covid-19: Secondary | ICD-10-CM | POA: Diagnosis present

## 2020-10-23 DIAGNOSIS — F17290 Nicotine dependence, other tobacco product, uncomplicated: Secondary | ICD-10-CM | POA: Diagnosis present

## 2020-10-23 DIAGNOSIS — K641 Second degree hemorrhoids: Secondary | ICD-10-CM | POA: Diagnosis present

## 2020-10-23 LAB — CBC WITH DIFFERENTIAL/PLATELET
Abs Immature Granulocytes: 0.03 10*3/uL (ref 0.00–0.07)
Basophils Absolute: 0 10*3/uL (ref 0.0–0.1)
Basophils Relative: 0 %
Eosinophils Absolute: 0.1 10*3/uL (ref 0.0–0.5)
Eosinophils Relative: 2 %
HCT: 27.7 % — ABNORMAL LOW (ref 39.0–52.0)
Hemoglobin: 9 g/dL — ABNORMAL LOW (ref 13.0–17.0)
Immature Granulocytes: 0 %
Lymphocytes Relative: 26 %
Lymphs Abs: 2.1 10*3/uL (ref 0.7–4.0)
MCH: 33 pg (ref 26.0–34.0)
MCHC: 32.5 g/dL (ref 30.0–36.0)
MCV: 101.5 fL — ABNORMAL HIGH (ref 80.0–100.0)
Monocytes Absolute: 0.9 10*3/uL (ref 0.1–1.0)
Monocytes Relative: 11 %
Neutro Abs: 5.1 10*3/uL (ref 1.7–7.7)
Neutrophils Relative %: 61 %
Platelets: ADEQUATE 10*3/uL (ref 150–400)
RBC: 2.73 MIL/uL — ABNORMAL LOW (ref 4.22–5.81)
RDW: 15.4 % (ref 11.5–15.5)
WBC: 8.3 10*3/uL (ref 4.0–10.5)
nRBC: 0 % (ref 0.0–0.2)

## 2020-10-23 LAB — COMPREHENSIVE METABOLIC PANEL
ALT: 56 U/L — ABNORMAL HIGH (ref 0–44)
AST: 59 U/L — ABNORMAL HIGH (ref 15–41)
Albumin: 3.4 g/dL — ABNORMAL LOW (ref 3.5–5.0)
Alkaline Phosphatase: 68 U/L (ref 38–126)
Anion gap: 8 (ref 5–15)
BUN: 7 mg/dL (ref 6–20)
CO2: 22 mmol/L (ref 22–32)
Calcium: 9.4 mg/dL (ref 8.9–10.3)
Chloride: 107 mmol/L (ref 98–111)
Creatinine, Ser: 0.7 mg/dL (ref 0.61–1.24)
GFR, Estimated: >60 mL/min (ref >60)
Glucose, Bld: 102 mg/dL — ABNORMAL HIGH (ref 70–99)
Potassium: 4.4 mmol/L (ref 3.5–5.1)
Sodium: 137 mmol/L (ref 135–145)
Total Bilirubin: 0.5 mg/dL (ref 0.3–1.2)
Total Protein: 6.3 g/dL — ABNORMAL LOW (ref 6.5–8.1)

## 2020-10-23 LAB — URINALYSIS, ROUTINE W REFLEX MICROSCOPIC
Bilirubin Urine: NEGATIVE
Glucose, UA: NEGATIVE mg/dL
Hgb urine dipstick: NEGATIVE
Ketones, ur: NEGATIVE mg/dL
Leukocytes,Ua: NEGATIVE
Nitrite: NEGATIVE
Protein, ur: NEGATIVE mg/dL
Specific Gravity, Urine: 1.005 (ref 1.005–1.030)
pH: 5 (ref 5.0–8.0)

## 2020-10-23 LAB — BASIC METABOLIC PANEL
Anion gap: 6 (ref 5–15)
BUN: 6 mg/dL (ref 6–20)
CO2: 16 mmol/L — ABNORMAL LOW (ref 22–32)
Calcium: 6.9 mg/dL — ABNORMAL LOW (ref 8.9–10.3)
Chloride: 119 mmol/L — ABNORMAL HIGH (ref 98–111)
Creatinine, Ser: 0.53 mg/dL — ABNORMAL LOW (ref 0.61–1.24)
GFR, Estimated: >60 mL/min (ref >60)
Glucose, Bld: 90 mg/dL (ref 70–99)
Potassium: 3.9 mmol/L (ref 3.5–5.1)
Sodium: 141 mmol/L (ref 135–145)

## 2020-10-23 LAB — POC OCCULT BLOOD, ED: Fecal Occult Bld: NEGATIVE

## 2020-10-23 LAB — LIPASE, BLOOD: Lipase: 55 U/L — ABNORMAL HIGH (ref 11–51)

## 2020-10-23 LAB — TROPONIN I (HIGH SENSITIVITY)
Troponin I (High Sensitivity): 3 ng/L (ref <18)
Troponin I (High Sensitivity): 3 ng/L (ref <18)

## 2020-10-23 LAB — HIV ANTIBODY (ROUTINE TESTING W REFLEX): HIV Screen 4th Generation wRfx: NONREACTIVE

## 2020-10-23 LAB — SARS CORONAVIRUS 2 (TAT 6-24 HRS): SARS Coronavirus 2: NEGATIVE

## 2020-10-23 LAB — MAGNESIUM: Magnesium: 1.4 mg/dL — ABNORMAL LOW (ref 1.7–2.4)

## 2020-10-23 MED ORDER — SERTRALINE HCL 100 MG PO TABS
100.0000 mg | ORAL_TABLET | Freq: Every day | ORAL | Status: DC
  Administered 2020-10-24 – 2020-10-29 (×6): 100 mg via ORAL
  Filled 2020-10-23 (×6): qty 1

## 2020-10-23 MED ORDER — ALUM & MAG HYDROXIDE-SIMETH 200-200-20 MG/5ML PO SUSP
30.0000 mL | Freq: Once | ORAL | Status: AC
  Administered 2020-10-23: 30 mL via ORAL
  Filled 2020-10-23: qty 30

## 2020-10-23 MED ORDER — FOLIC ACID 1 MG PO TABS
1.0000 mg | ORAL_TABLET | Freq: Every day | ORAL | Status: DC
  Administered 2020-10-23 – 2020-10-29 (×7): 1 mg via ORAL
  Filled 2020-10-23 (×7): qty 1

## 2020-10-23 MED ORDER — HYDROMORPHONE HCL 1 MG/ML IJ SOLN
0.5000 mg | INTRAMUSCULAR | Status: DC | PRN
  Administered 2020-10-24: 0.5 mg via INTRAVENOUS
  Administered 2020-10-24 – 2020-10-25 (×6): 1 mg via INTRAVENOUS
  Filled 2020-10-23 (×7): qty 1

## 2020-10-23 MED ORDER — LORAZEPAM 1 MG PO TABS
1.0000 mg | ORAL_TABLET | ORAL | Status: AC | PRN
  Administered 2020-10-24: 3 mg via ORAL
  Administered 2020-10-26: 4 mg via ORAL
  Administered 2020-10-26: 2 mg via ORAL
  Administered 2020-10-26: 1 mg via ORAL
  Filled 2020-10-23: qty 2
  Filled 2020-10-23: qty 3
  Filled 2020-10-23: qty 1
  Filled 2020-10-23: qty 4

## 2020-10-23 MED ORDER — PANCRELIPASE (LIP-PROT-AMYL) 36000-114000 UNITS PO CPEP
72000.0000 [IU] | ORAL_CAPSULE | Freq: Three times a day (TID) | ORAL | Status: DC
  Administered 2020-10-24 – 2020-10-29 (×17): 72000 [IU] via ORAL
  Filled 2020-10-23 (×19): qty 2

## 2020-10-23 MED ORDER — SUCRALFATE 1 G PO TABS
1.0000 g | ORAL_TABLET | Freq: Three times a day (TID) | ORAL | Status: DC
  Administered 2020-10-23 – 2020-10-29 (×22): 1 g via ORAL
  Filled 2020-10-23 (×23): qty 1

## 2020-10-23 MED ORDER — CALCIUM GLUCONATE-NACL 1-0.675 GM/50ML-% IV SOLN
1.0000 g | Freq: Once | INTRAVENOUS | Status: AC
  Administered 2020-10-23: 1000 mg via INTRAVENOUS
  Filled 2020-10-23: qty 50

## 2020-10-23 MED ORDER — FLUTICASONE FUROATE-VILANTEROL 200-25 MCG/INH IN AEPB
1.0000 | INHALATION_SPRAY | Freq: Every day | RESPIRATORY_TRACT | Status: DC
  Administered 2020-10-24 – 2020-10-26 (×2): 1 via RESPIRATORY_TRACT
  Filled 2020-10-23 (×3): qty 28

## 2020-10-23 MED ORDER — LORAZEPAM 2 MG/ML IJ SOLN
1.0000 mg | INTRAMUSCULAR | Status: AC | PRN
  Administered 2020-10-25 – 2020-10-26 (×4): 2 mg via INTRAVENOUS
  Filled 2020-10-23 (×4): qty 1

## 2020-10-23 MED ORDER — OXYCODONE-ACETAMINOPHEN 5-325 MG PO TABS
1.0000 | ORAL_TABLET | Freq: Once | ORAL | Status: AC
  Administered 2020-10-23: 1 via ORAL
  Filled 2020-10-23: qty 1

## 2020-10-23 MED ORDER — ADULT MULTIVITAMIN W/MINERALS CH
1.0000 | ORAL_TABLET | Freq: Every day | ORAL | Status: DC
  Administered 2020-10-23 – 2020-10-29 (×7): 1 via ORAL
  Filled 2020-10-23 (×7): qty 1

## 2020-10-23 MED ORDER — ENOXAPARIN SODIUM 40 MG/0.4ML IJ SOSY: 40.0000 mg | PREFILLED_SYRINGE | INTRAMUSCULAR | Status: DC

## 2020-10-23 MED ORDER — ALBUTEROL SULFATE (2.5 MG/3ML) 0.083% IN NEBU: 3.0000 mL | INHALATION_SOLUTION | RESPIRATORY_TRACT | Status: DC | PRN

## 2020-10-23 MED ORDER — VITAMIN D (ERGOCALCIFEROL) 1.25 MG (50000 UNIT) PO CAPS: 50000.0000 [IU] | ORAL_CAPSULE | ORAL | Status: DC

## 2020-10-23 MED ORDER — PANTOPRAZOLE SODIUM 40 MG PO TBEC
40.0000 mg | DELAYED_RELEASE_TABLET | Freq: Two times a day (BID) | ORAL | Status: DC
  Administered 2020-10-23 – 2020-10-29 (×12): 40 mg via ORAL
  Filled 2020-10-23 (×12): qty 1

## 2020-10-23 MED ORDER — OXYCODONE-ACETAMINOPHEN 5-325 MG PO TABS
1.0000 | ORAL_TABLET | Freq: Four times a day (QID) | ORAL | Status: DC | PRN
  Administered 2020-10-23: 1 via ORAL
  Filled 2020-10-23: qty 1

## 2020-10-23 MED ORDER — LIDOCAINE VISCOUS HCL 2 % MT SOLN
15.0000 mL | Freq: Once | OROMUCOSAL | Status: AC
  Administered 2020-10-23: 15 mL via ORAL
  Filled 2020-10-23: qty 15

## 2020-10-23 MED ORDER — LIDOCAINE VISCOUS HCL 2 % MT SOLN
15.0000 mL | Freq: Four times a day (QID) | OROMUCOSAL | Status: DC | PRN
  Administered 2020-10-24 – 2020-10-29 (×4): 15 mL via OROMUCOSAL
  Filled 2020-10-23 (×5): qty 15

## 2020-10-23 MED ORDER — THIAMINE HCL 100 MG PO TABS
100.0000 mg | ORAL_TABLET | Freq: Every day | ORAL | Status: DC
  Administered 2020-10-23 – 2020-10-29 (×7): 100 mg via ORAL
  Filled 2020-10-23 (×7): qty 1

## 2020-10-23 NOTE — H&P (Addendum)
Date: 10/23/2020               Patient Name:  [name] MRN: [id number]  DOB: Oct 03, 1970 Age / Sex: 50 y.o., male   PCP: Simona Huh, NP         Medical Service: Internal Medicine Teaching Service         Attending Physician: Dr. Velna Ochs, MD    First Contact: Dr. Rosita Kea Pager: [phone]  Second Contact: Dr. Court Joy Pager: [phone]       After Hours (After 5p/  First Contact Pager: [phone]  weekends / holidays): Second Contact Pager: [phone]   Chief Complaint: [employer] Rt   History of Present Illness:   [name] is a 50 y.o. male with hx of hypertension, WPW pattern, alcohol abuse disorder, chronic alcohol induced recurrent pancreatitis, sickle cell trait, bipolar disorder, MDD, presented with jerking movements in his arms and legs. Pt states his right arm and leg are jerking involuntarily. Reports it started Thursday morning and has progressively got worse. It has kept him from sleeping. Also has some tingling in his right calf.  Denies any new weakness or tingling in his face. He feels fatigued and sluggish. Pt states his temporal vein is distended more than normal. Says it doesn't hurt and he doesn't have a headache.  Denies any dizziness.[name] He also complains of some bright red maroon colored stool. He has h/o bleeding ulcers and had 2 units of blood transfusion in 2020. He endorses h/o recurrent pancreatitis due to chronic alcohol use. He states he had some nausea yesterday but no vomiting. States he has been having diarrhea last week with black stool. ED Course: Patient presented with right arm and leg jerking.  Also complains of epigastric pain and history of recurrent pancreatitis.  His vitals were stable with blood pressure 111/74 mmHg. pulse rate 98/min afebrile, satting 100% at room air.  His labs showed sodium 141, potassium 3.9, calcium low at 6.9, glucose 90, magnesium 1.4, with normal renal function.  WBC is 8.3, hemoglobin 9 with MCV of  101.5.  AST/ ALT mildly elevated at 59 and 56.  Troponins low at 3.  CXR normal.  EKG shows sinus rhythm with short PR interval.  No ST elevation.  Urinalysis negative for leukocytes and protein.  Patient was given calcium gluconate 1000 mg, Zofran in the ED. Started on Protonix and Carafate.  Patient was admitted to IM [facility] for further management.  Current Outpatient Medications  Medication Instructions   albuterol (VENTOLIN HFA) 108 (90 Base) MCG/ACT inhaler 2 puffs, Inhalation, Every 4 hours PRN   Cyanocobalamin (VITAMIN B-12 PO) 1 tablet, Oral, Daily   cyclobenzaprine (FLEXERIL) 10 MG tablet 1 tablet, Oral, 3 times daily PRN   fluticasone furoate-vilanterol (BREO ELLIPTA) 200-25 MCG/INH AEPB 1 puff, Inhalation, Daily   folic acid (FOLVITE) 1 mg, Oral, Daily   hydrALAZINE (APRESOLINE) 25 mg, Oral, 3 times daily   lidocaine (XYLOCAINE) 2 % solution 15 mLs, Mouth/Throat, Every 6 hours PRN   lipase/protease/amylase (CREON) 72,000 Units, Oral, 3 times daily with meals, For pancreatitis   metoprolol (TOPROL-XL) 200 mg, Oral, Daily   metoprolol tartrate (LOPRESSOR) 100 mg, Oral, 2 times daily   Multiple Vitamin (MULTIVITAMIN WITH MINERALS) TABS tablet 1 tablet, Oral, Daily   omeprazole (PRILOSEC) 40 MG capsule Take once to twice daily as needed   ondansetron (ZOFRAN-ODT) 4 mg, Oral, 2 times daily PRN   oxyCODONE-acetaminophen (PERCOCET/ROXICET) 5-325 MG tablet 1 tablet, Oral, 4  times daily PRN   potassium chloride (MICRO-K) 10 MEQ CR capsule 10 mEq, Oral, Daily PRN   QUEtiapine (SEROQUEL) 50 mg, Oral, Daily at bedtime   sertraline (ZOLOFT) 100 mg, Oral, Daily   sucralfate (CARAFATE) 1 g, Oral, 3 times daily with meals & bedtime   thiamine 100 mg, Oral, Daily   verapamil (CALAN-SR) 120 mg, Oral, Daily   Vitamin D, Ergocalciferol, (DRISDOL) 1.25 MG (50000 UNIT) CAPS capsule 1 capsule, Oral, Weekly    Allergies as of 10/23/2020 - Review Complete 10/23/2020  Allergen Reaction Noted   Robaxin  [methocarbamol] Other (See Comments) 07/20/2016   Shellfish-derived products Nausea And Vomiting 08/18/2010   Trazodone and nefazodone Other (See Comments) 09/28/2011   Adhesive [tape] Itching 03/17/2018   Latex Itching 03/17/2018   Toradol [ketorolac tromethamine] Other (See Comments) 03/17/2018   Contrast media [iodinated diagnostic agents] Hives 12/04/2016   Reglan [metoclopramide] Other (See Comments) 11/18/2015    Past Medical History:  Diagnosis Date   Alcoholism /alcohol abuse    Anemia    Anxiety    Arthritis    "knees; arms; elbows" (03/26/2015)   Asthma    Bipolar disorder (H[facility])    Chronic bronchitis ([facility])    Chronic lower back pain    Chronic pancreatitis ([place])    Cocaine abuse ([place])    Depression    Family history of adverse reaction to anesthesia    "grandmother gets confused"   Femoral condyle fracture ([place]) 03/08/2014   left medial/notes 03/09/2014   GERD (gastroesophageal reflux disease)    H/O hiatal hernia    H/O suicide attempt 10/2012   High cholesterol    History of blood transfusion 10/2012   "when I tried to commit suicide"   History of stomach ulcers    Hypertension    Marijuana abuse, continuous    Migraine    "a few times/year" (03/26/2015)   Pneumonia 1990's X 3   PTSD (post-traumatic stress disorder)    Sickle cell trait ([place])    WPW (Wolff-Parkinson-White syndrome)    [name] 03/06/2013    Past Surgical History:  Procedure Laterality Date   BIOPSY  11/25/2017   Procedure: BIOPSY;  Surgeon: Arta Silence, MD;  Location: [place];  Service: Endoscopy;;   BIOPSY  10/14/2018   Procedure: BIOPSY;  Surgeon: Arta Silence, MD;  Location: [place];  Service: Endoscopy;;   CARDIAC CATHETERIZATION     CYST ENTEROSTOMY  01/02/2020   Procedure: CYST ASPIRATION;  Surgeon: Milus Banister, MD;  Location: [facility] ENDOSCOPY;  Service: Endoscopy;;   ESOPHAGOGASTRODUODENOSCOPY (EGD) WITH PROPOFOL N/A 11/25/2017   Procedure: ESOPHAGOGASTRODUODENOSCOPY  (EGD) WITH PROPOFOL;  Surgeon: Arta Silence, MD;  Location: [facility] ENDOSCOPY;  Service: Endoscopy;  Laterality: N/A;   ESOPHAGOGASTRODUODENOSCOPY (EGD) WITH PROPOFOL Left 10/14/2018   Procedure: ESOPHAGOGASTRODUODENOSCOPY (EGD) WITH PROPOFOL;  Surgeon: Arta Silence, MD;  Location[facility]COPY;  Service: Endoscopy;  Laterality: Left;   ESOPHAGOGASTRODUODENOSCOPY (EGD) WITH PROPOFOL N/A 11/14/2018   Procedure: ESOPHAGOGASTRODUODENOSCOPY (EGD) WITH PROPOFOL;  Surgeon: Laurence Spates, MD;  Location: [facility] ENDOSCOPY;  Service: Gastroenterology;  Laterality: N/A;   ESOPHAGOGASTRODUODENOSCOPY (EGD) WITH PROPOFOL N/A 01/02/2020   Procedure: ESOPHAGOGASTRODUODENOSCOPY (EGD) WITH PROPOFOL;  Surgeon: Milus Banister, MD;  Location: [facility] ENDOSCOPY;  Service: Endoscopy;  Laterality: N/A;   EUS N/A 01/02/2020   Procedure: UPPER ENDOSCOPIC ULTRASOUND (EUS) RADIAL;  Surgeon: Milus Banister, MD;  Location: [facility] ENDOSCOPY;  Service: Endoscopy;  Laterality: N/A;   EYE SURGERY Left 1990's   "result of trauma"  FACIAL FRACTURE SURGERY Left 1990's   "result of trauma"    FRACTURE SURGERY     HERNIA REPAIR     LEFT HEART CATHETERIZATION WITH CORONARY ANGIOGRAM Right 03/07/2013   Procedure: LEFT HEART CATHETERIZATION WITH CORONARY ANGIOGRAM;  Surgeon: [name], MD;  Location: [place] CATH LAB;  Service: Cardiovascular;  Laterality: Right;   UMBILICAL HERNIA REPAIR     UPPER GASTROINTESTINAL ENDOSCOPY      Family History  Problem Relation Age of Onset   Hypertension Mother    Cirrhosis Father    Alcoholism Father    Hypertension Father    Melanoma Father    Hypertension Other    Coronary artery disease Other     Social History   Tobacco Use   Smoking status: Every Day    Packs/day: 1.00    Years: 36.00    Pack years: 36.00    Types: Cigarettes, E-cigarettes   Smokeless tobacco: Never  Vaping Use   Vaping Use: Former  Substance Use Topics   Alcohol use: Yes    Comment: last drink 8 days ago   Drug  use: Yes    Types: Marijuana, Cocaine    Comment: daily marijuana use; last cocaine use about 3 months ago  Smokes half pack per day Alcohol: 6 pack beer per day since he was 50 year old, last drink yesterday Drugs :uses marijuana on weekends  Review of Systems: A complete ROS is negative other than mentioned in H&P  Physical Exam: Blood pressure 125/79, pulse 85, temperature 98.3 F (36.8 C), resp. rate (!) 33, SpO2 100 %. Physical Exam Vitals reviewed.  Constitutional:      General: He is in acute distress.     Appearance: He is not ill-appearing, toxic-appearing or diaphoretic.  HENT:     Head: Normocephalic and atraumatic.  Eyes:     Extraocular Movements: Extraocular movements intact.     Pupils: Pupils are equal, round, and reactive to light.  Cardiovascular:     Rate and Rhythm: Normal rate and regular rhythm.     Heart sounds: Normal heart sounds.  Pulmonary:     Effort: Pulmonary effort is normal.     Breath sounds: Normal breath sounds. No wheezing or rhonchi.  Abdominal:     General: There is no abdominal bruit.     Palpations: Abdomen is soft. There is no hepatomegaly.     Tenderness: There is abdominal tenderness.     Comments: Generalized Tenderness all regions specially in Epigastric region  Musculoskeletal:        General: Normal range of motion.     Cervical back: Normal range of motion.     Right lower leg: No edema.     Left lower leg: No edema.  Skin:    General: Skin is warm and dry.  Neurological:     General: No focal deficit present.     Mental Status: He is alert and oriented to person, place, and time.  Psychiatric:        Mood and Affect: Mood is anxious.        Behavior: Behavior normal.     EKG: personally reviewed my interpretation is EKG shows sinus rhythm with short PR interval.  No ST elevation.    CXR: personally reviewed my interpretation is no acute abnormality  DG Chest Port 1 View  Result Date: 10/23/2020 CLINICAL DATA:   Left-sided chest pain EXAM: PORTABLE CHEST 1 VIEW COMPARISON:  10/04/2020 FINDINGS: The heart size and mediastinal contours  are within normal limits. Mildly hyperinflated lungs. No focal airspace consolidation, pleural effusion, or pneumothorax. The visualized skeletal structures are unremarkable. IMPRESSION: No active disease. Electronically Signed   By: Davina Poke D.O.   On: 10/23/2020 14:13    Assessment & Plan by Problem: Active Problems:   Hypocalcemia   [name] is a 50 y.o. male with hx of hypertension, WPW pattern, alcohol abuse disorder, alcohol induced recurrent pancreatitis, sickle cell trait, bipolar disorder, MDD, presented with epigastric pain and jerking movements in his arms and legs found to have hypocalcemia with calcium 6.9.  Hypocalcemia Patient presented with right arm and leg jerking.  Patient found to have calcium of 6.9.  In the ED received 1000 mg calcium gluconate.  No twitching or jerking on examination.  Neuro examination unremarkable.   Likely related to chronic pancreatitis and chronic alcoholic use.  Other differentials include vitamin D deficiency, hypoparathyroidism, hypomagnesemia.  We will get parathyroid hormone level, mag and Phos levels. -Monitor symptoms. -Monitor CMP in the morning. -Continue cardiac monitoring -Magnesium level - PTH level - Phosphorous level. -Replete Calcium if low.  Chronic pancreatitis Patient has a history of chronic recurrent alcoholic pancreatitis.  Complains of abdominal pain especially in epigastric region. On exam mildly tender all over especially in epigastric region. Lipase mildly elevated at 55.   -NPO -Oxycodone 5-325 mg tablet 4 times daily as needed -Continue lipase 72,000 Units Orally 3 Times Daily with Meals -Dilaudid every 0.5 to 1 mg 2 hour as needed -CT abdomen pelvis with contrast -Monitor vitals  Peptic ulcers Bright red blood mixed with stool History of peptic ulcers.  History of blood  transfusion in 2020 due to gastric ulcer.  On gastric cocktail at home.  Patient complained of bright red blood mixed with stool.  Denies history of hemorrhoids. FOBT negative at this time.  Hemoglobin 9. -Monitor vitals -Monitor CBC -Continue Prilosec 40 mg twice daily. -Continue GI cocktail including lidocaine viscus mild solution -No NSAIDs  Alcohol use Patient drinks 6 packs of beer every day since he was 50 year old .  Last drink yesterday.  History of alcohol seizure in the past . -CIWA with Ativan. -Continue thiamine, Multivitamin, folic acid. -Seizure precautions.  Asthma On Breo Ellipta at home.  Satting 100% at room air. -Albuterol every 4 hours for wheezing -Oxygen as needed ,keep oxygen saturation more than 92%  Hypertension BP normotensive.  BP 125/79 mmHg . Home medication include Toprol-XL 200 mg daily, hydralazine 25 mg 3 times daily, potassium chloride 10 mEq daily. Verapamil 125 mg CR tablet daily -Hold home medication in the setting of n.p.o. status and normotensive BP. -Monitor vitals  Depression Continues Zoloft 100 mg daily  Dispo: Admit patient to Observation with expected length of stay less than 2 midnights.  Signed: [name], MD 10/23/2020, 9:27 PM  Pager: [phone] After 5pm on weekdays and 1pm on weekends: On Call pager: [phone]

## 2020-10-23 NOTE — ED Notes (Signed)
Pt attempted to go out to lobby to get his bags that his sister had brought him, RN explained that pt could not go into lobby w/ IV access. Deneise Lever RN explained that she would go retrieve the bags from his sister when she had a moment, pt raised his voice, cursing at [employer] stating he was going to leave since he can't even go get his bags. This RN explained that he couldn't go out into lobby w/ IV in his hand. Deneise Lever RN stated she could take out his IV and he could go to lobby. Pt started yelling at RNs, stating "don't come near me I don't want you touching me" and attempting to move towards lobby doors. Security called.

## 2020-10-23 NOTE — ED Notes (Signed)
Pt walking in hallway, RN asked pt to return to room. Pt stated he needed to go to the bathroom, RN directed pt to bathroom. Pt stated he would go in a minute, RN asked pt to either use bathroom or return to room.

## 2020-10-23 NOTE — ED Notes (Signed)
Pt attempted to walk out of ED, redirected back to room. Pt said he just needed to step out real quick and would be back. Pt informed he could not step out w/ IV in his hand. RN explained that if he wanted to leave, IV would need to be removed. Pt returned to room.

## 2020-10-23 NOTE — ED Notes (Signed)
Pt is back in room

## 2020-10-23 NOTE — ED Notes (Signed)
Pt not able to have visitors at this time due to violent behavior and threats.

## 2020-10-23 NOTE — ED Notes (Signed)
Security remaining at bedside at this time due to labile behavior. Pt continues to communicate threats to this RN and others. Stating "You don't know who you talking to little lady, you have no idea"

## 2020-10-23 NOTE — ED Notes (Addendum)
Pt made multiple attempts to exit towards door of lobby because his sister was here with bags. This RN advised pt that bags could be delivered to security desk and then I would retrieve them in a few moments. Pt became irate and made repeated attempts towards lobby w/ IV access present stating "Fuck y'all I'm leaving this joint, you won't even get me my fucking bags." Advised pt that it would be a few moments to retrieve bags, but they would be safe at security desk temporarily Pt continued to shout at this RN, opened doors to lobby and attempted to leave once again. Security to bedside, removed IV due to pts repeated attempts to leave.Paged admitting team to make them aware of necessity to remove IV access

## 2020-10-23 NOTE — ED Notes (Signed)
Pt stepped outside to see his wife.

## 2020-10-23 NOTE — ED Notes (Signed)
Meal tray ordered 

## 2020-10-23 NOTE — ED Notes (Signed)
Pt asked RN if he could step out really quick, pt has IV meds attached to IV and was trying to walk out w/ the IV pump. RN explained that pt could not go out with IV and IV pump, RN asked pt to return to room, pt agreeable.

## 2020-10-23 NOTE — ED Triage Notes (Signed)
Pt BIB EMS for c/o left side CP that started in his neck and moved down to chest. Pt is homeless and reports pain started after walking for about 15 minutes. Pt also reports tremors in all extremities. Hx of pancreatitis; last drink this am. Pt refused nitro with EMS due to Viagra use.

## 2020-10-23 NOTE — ED Provider Notes (Signed)
[facility] EMERGENCY DEPARTMENT Provider Note   CSN: [id number] Arrival date & time: 10/23/20  1239     History Chief Complaint  Patient presents with   Chest Pain    [name] is a 50 y.o. male with PMHx HTN, cocaine abuse, bipolar disorder, alcohol abuse, sickle cell trait who presents to the ED via EMS with complaint of gradual onset, constant, sharp/stabbing, left sided chest pain radiating into left arm that began about 45 minutes PTA. Pt also complains of SOB. He mentions that he felt nauseated this morning and had 1 episode of NBNB emesis prior to his chest pain beginning several hours later. Pt has multiple ED visits in the past for nonspecific chest pain with negative cardiac work up. He declined NTG with EMS due to taking viagra about 12 hours earlier. He also states he cannot take Aspirin due to stomach ulcers. Pt is requesting a GI cocktail at this time as he states it normally helps his pain. No abdominal pain. No other complaints at this time. Pt is a everyday smoker.   Pt also complains of "jerking" in his arms and his legs that has been ongoing for a couple of months however worse recently. His PCP placed him on Flexeril without relief of his symptoms. Pt requesting Ativan at this time.   The history is provided by the patient and medical records.      Past Medical History:  Diagnosis Date   Alcoholism /alcohol abuse    Anemia    Anxiety    Arthritis    "knees; arms; elbows" (03/26/2015)   Asthma    Bipolar disorder (HCC)    Chronic bronchitis (HCC)    Chronic lower back pain    Chronic pancreatitis ([place])    Cocaine abuse ([place])    Depression    Family history of adverse reaction to anesthesia    "grandmother gets confused"   Femoral condyle fracture ([place]) 03/08/2014   left medial/notes 03/09/2014   GERD (gastroesophageal reflux disease)    H/O hiatal hernia    H/O suicide attempt 10/2012   High cholesterol    History of blood  transfusion 10/2012   "when I tried to commit suicide"   History of stomach ulcers    Hypertension    Marijuana abuse, continuous    Migraine    "a few times/year" (03/26/2015)   Pneumonia 1990's X 3   PTSD (post-traumatic stress disorder)    Sickle cell trait ([place])    WPW (Wolff-Parkinson-White syndrome)    [name] 03/06/2013    Patient Active Problem List   Diagnosis Date Noted   AKI (acute kidney injury) ([place]) 11/13/2018   Seizure ([place]) 11/13/2018   Gastritis and gastroduodenitis    Chest pain 01/08/2018   GI bleed 11/24/2017   Acute blood loss anemia 11/24/2017   Atypical chest pain 11/24/2017   Acute pancreatitis 09/28/2017   Abdominal pain 05/27/2017   Hematemesis 05/27/2017   Tachycardia 03/18/2017   Diarrhea 03/18/2017   Acute on chronic pancreatitis ([place]) 12/17/2016   Intractable nausea and vomiting 12/05/2016   Verbally abusive behavior 12/05/2016   Normocytic anemia 12/05/2016   Alcohol use disorder, severe, dependence ([place]) 07/25/2016   Cocaine use disorder, severe, dependence ([place]) 07/25/2016   Major depressive disorder, 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   Leukocytosis    Hospital acquired PNA 05/20/2015   Chronic pancreatitis ([place]) 05/18/2015   Pseudocyst of pancreas 05/18/2015   Polysubstance abuse (tobacco, cocaine, THC, and  ETOH) 03/26/2015   Alcohol-induced chronic pancreatitis ([place])    Essential hypertension 02/06/2014   Mood disorder ([place]) 02/06/2014   Alcohol-induced acute pancreatitis 11/28/2013   Pancreatic pseudocyst/cyst 11/25/2013   Severe protein-calorie malnutrition ([place]) 10/10/2013   Suicide attempt ([place]) 10/08/2013   [name] Parkinson White pattern seen on electrocardiogram 10/03/2012   TOBACCO ABUSE 03/23/2007    Past Surgical History:  Procedure Laterality Date   BIOPSY  11/25/2017   Procedure: BIOPSY;  Surgeon: Arta Silence, MD;  Location: [facility] ENDOSCOPY;  Service: Endoscopy;;   BIOPSY  10/14/2018    Procedure: BIOPSY;  Surgeon: Arta Silence, MD;  Location: [facility] ENDOSCOPY;  Service: Endoscopy;;   CARDIAC CATHETERIZATION     CYST ENTEROSTOMY  01/02/2020   Procedure: CYST ASPIRATION;  Surgeon: Milus Banister, MD;  Location: [facility] ENDOSCOPY;  Service: Endoscopy;;   ESOPHAGOGASTRODUODENOSCOPY (EGD) WITH PROPOFOL N/A 11/25/2017   Procedure: ESOPHAGOGASTRODUODENOSCOPY (EGD) WITH PROPOFOL;  Surgeon: Arta Silence, MD;  Location: [facility] ENDOSCOPY;  Service: Endoscopy;  Laterality: N/A;   ESOPHAGOGASTRODUODENOSCOPY (EGD) WITH PROPOFOL Left 10/14/2018   Procedure: ESOPHAGOGASTRODUODENOSCOPY (EGD) WITH PROPOFOL;  Surgeon: Arta Silence, MD;  Location: [facility] ENDOSCOPY;  Service: Endoscopy;  Laterality: Left;   ESOPHAGOGASTRODUODENOSCOPY (EGD) WITH PROPOFOL N/A 11/14/2018   Procedure: ESOPHAGOGASTRODUODENOSCOPY (EGD) WITH PROPOFOL;  Surgeon: Laurence Spates, MD;  Location: [facility] ENDOSCOPY;  Service: Gastroenterology;  Laterality: N/A;   ESOPHAGOGASTRODUODENOSCOPY (EGD) WITH PROPOFOL N/A 01/02/2020   Procedure: ESOPHAGOGASTRODUODENOSCOPY (EGD) WITH PROPOFOL;  Surgeon: Milus Banister, MD;  Location: [facility] ENDOSCOPY;  Service: Endoscopy;  Laterality: N/A;   EUS N/A 01/02/2020   Procedure: UPPER ENDOSCOPIC ULTRASOUND (EUS) RADIAL;  Surgeon: Milus Banister, MD;  Location: [facility] ENDOSCOPY;  Service: Endoscopy;  Laterality: N/A;   EYE SURGERY Left 1990's   "result of trauma"    FACIAL FRACTURE SURGERY Left 1990's   "result of trauma"    FRACTURE SURGERY     HERNIA REPAIR     LEFT HEART CATHETERIZATION WITH CORONARY ANGIOGRAM Right 03/07/2013   Procedure: LEFT HEART CATHETERIZATION WITH CORONARY ANGIOGRAM;  Surgeon: [name], MD;  Location: [facility] CATH LAB;  Service: Cardiovascular;  Laterality: Right;   UMBILICAL HERNIA REPAIR     UPPER GASTROINTESTINAL ENDOSCOPY         Family History  Problem Relation Age of Onset   Hypertension Mother    Cirrhosis Mother    Alcoholism Mother    Hypertension Father    Melanoma  Father    Hypertension Other    Coronary artery disease Other     Social History   Tobacco Use   Smoking status: Every Day    Packs/day: 1.00    Years: 36.00    Pack years: 36.00    Types: Cigarettes, E-cigarettes   Smokeless tobacco: Never  Vaping Use   Vaping Use: Former  Substance Use Topics   Alcohol use: Yes    Comment: last drink 8 days ago   Drug use: Yes    Types: Marijuana, Cocaine    Comment: daily marijuana use; last cocaine use about 3 months ago    Home Medications Prior to Admission medications   Medication Sig Start Date End Date Taking? Authorizing Provider  albuterol (VENTOLIN HFA) 108 (90 Base) MCG/ACT inhaler Inhale 2 puffs into the lungs every 4 (four) hours as needed for wheezing or shortness of breath. 11/12/19  Yes Noemi Chapel P, DO  Cyanocobalamin (VITAMIN B-12 PO) Take 1 tablet by mouth daily.   Yes [provider]  cyclobenzaprine (FLEXERIL) 10 MG  tablet Take 1 tablet by mouth 3 (three) times daily as needed for muscle spasms. 10/20/20  Yes [provider]  fluticasone furoate-vilanterol (BREO ELLIPTA) 200-25 MCG/INH AEPB Inhale 1 puff into the lungs daily. Patient taking differently: Inhale 1 puff into the lungs daily as needed. 11/12/19  Yes [name], DO  folic acid (FOLVITE) 1 MG tablet Take 1 tablet (1 mg total) by mouth daily. 11/26/17  Yes [name], MD  hydrALAZINE (APRESOLINE) 25 MG tablet Take 1 tablet (25 mg total) by mouth 3 (three) times daily. 03/27/20  Yes Newlin, Charlane Ferretti, MD  lidocaine (XYLOCAINE) 2 % solution Use as directed 15 mLs in the mouth or throat every 6 (six) hours as needed for mouth pain. 05/25/20  Yes [name], MD  lipase/protease/amylase (CREON) 36000 UNITS CPEP capsule Take 2 capsules (72,000 Units total) by mouth 3 (three) times daily with meals. For pancreatitis Patient taking differently: Take 72,000 Units by mouth 3 (three) times daily with meals. 11/05/19  Yes [name], [name], MD   metoprolol (TOPROL-XL) 200 MG 24 hr tablet Take 200 mg by mouth daily. 08/20/20  Yes [provider]  Multiple Vitamin (MULTIVITAMIN WITH MINERALS) TABS tablet Take 1 tablet by mouth daily. 09/06/17  Yes Bonnell Public, MD  omeprazole (PRILOSEC) 40 MG capsule Take once to twice daily as needed Patient taking differently: Take 40 mg by mouth daily. 04/07/20  Yes [name], [name], MD  ondansetron (ZOFRAN-ODT) 4 MG disintegrating tablet Take 4 mg by mouth 2 (two) times daily as needed for vomiting or nausea. 10/20/20  Yes [provider]  oxyCODONE-acetaminophen (PERCOCET/ROXICET) 5-325 MG tablet Take 1 tablet by mouth 4 (four) times daily as needed for moderate pain or severe pain. 10/20/20  Yes [provider]  potassium chloride (MICRO-K) 10 MEQ CR capsule Take 10 mEq by mouth daily as needed (low potassium). 08/12/20  Yes [provider]  QUEtiapine (SEROQUEL) 50 MG tablet Take 1 tablet (50 mg total) by mouth at bedtime. 10/11/20  Yes Lucky Rathke, FNP  sertraline (ZOLOFT) 100 MG tablet Take 1 tablet (100 mg total) by mouth daily. 03/27/20  Yes [name], MD  sucralfate (CARAFATE) 1 g tablet Take 1 tablet (1 g total) by mouth 4 (four) times daily -  with meals and at bedtime. 05/23/20  Yes Kerin Perna, NP  thiamine 100 MG tablet Take 1 tablet (100 mg total) by mouth daily. 10/16/18  Yes [name], MD  verapamil (CALAN-SR) 120 MG CR tablet Take 120 mg by mouth daily. 08/20/20  Yes [provider]  Vitamin D, Ergocalciferol, (DRISDOL) 1.25 MG (50000 UNIT) CAPS capsule Take 1 capsule by mouth once a week. 10/19/20  Yes [provider]  metoprolol tartrate (LOPRESSOR) 100 MG tablet Take 1 tablet (100 mg total) by mouth 2 (two) times daily. Patient not taking: No sig reported 05/23/20   Kerin Perna, NP  amitriptyline (ELAVIL) 25 MG tablet Take 1 tablet (25 mg total) by mouth at bedtime. Patient not taking: Reported on 08/08/2019  10/15/18 08/08/19  [name], MD  gabapentin (NEURONTIN) 100 MG capsule Take 1 capsule (100 mg total) by mouth 2 (two) times daily. Patient not taking: Reported on 08/08/2019 10/15/18 08/08/19  [name], MD  pantoprazole (PROTONIX) 40 MG tablet Take 1 tablet (40 mg total) by mouth 2 (two) times daily. Patient not taking: Reported on 11/17/2019 11/05/19 03/10/20  Yetta Flock, MD  promethazine (PHENERGAN) 25 MG tablet  Take 1 tablet (25 mg total) by mouth every 6 (six) hours as needed for nausea or vomiting. Patient not taking: Reported on 08/08/2019 03/02/19 08/08/19  Kinnie Feil, PA-C    Allergies    Robaxin [methocarbamol], Shellfish-derived products, Trazodone and nefazodone, Adhesive [tape], Latex, Toradol [ketorolac tromethamine], Contrast media [iodinated diagnostic agents], and Reglan [metoclopramide]  Review of Systems   Review of Systems  Constitutional:  Negative for chills, diaphoresis and fever.  Respiratory:  Positive for shortness of breath.   Cardiovascular:  Positive for chest pain. Negative for palpitations and leg swelling.  Gastrointestinal:  Positive for nausea and vomiting.  All other systems reviewed and are negative.  Physical Exam Updated Vital Signs BP 101/70   Pulse 80   Temp 98.3 F (36.8 C)   Resp 20   SpO2 100%   Physical Exam Vitals and nursing note reviewed.  Constitutional:      Appearance: He is not ill-appearing or diaphoretic.  HENT:     Head: Normocephalic and atraumatic.  Eyes:     Conjunctiva/sclera: Conjunctivae normal.  Cardiovascular:     Rate and Rhythm: Normal rate and regular rhythm.     Pulses:          Radial pulses are 2+ on the right side and 2+ on the left side.       Dorsalis pedis pulses are 2+ on the right side and 2+ on the left side.     Heart sounds: Normal heart sounds.  Pulmonary:     Effort: Pulmonary effort is normal.     Breath sounds: Normal breath sounds. No decreased breath sounds, wheezing, rhonchi  or rales.  Chest:     Chest wall: No tenderness.  Abdominal:     Palpations: Abdomen is soft.     Tenderness: There is no abdominal tenderness.  Musculoskeletal:     Cervical back: Neck supple.     Right lower leg: No tenderness. No edema.     Left lower leg: No tenderness. No edema.  Skin:    General: Skin is warm and dry.  Neurological:     Mental Status: He is alert.    ED Results / Procedures / Treatments   Labs (all labs ordered are listed, but only abnormal results are displayed) Labs Reviewed  BASIC METABOLIC PANEL - Abnormal; Notable for the following components:      Result Value   Chloride 119 (*)    CO2 16 (*)    Creatinine, Ser 0.53 (*)    Calcium 6.9 (*)    All other components within normal limits  CBC WITH DIFFERENTIAL/PLATELET - Abnormal; Notable for the following components:   RBC 2.73 (*)    Hemoglobin 9.0 (*)    HCT 27.7 (*)    MCV 101.5 (*)    All other components within normal limits  URINALYSIS, ROUTINE W REFLEX MICROSCOPIC - Abnormal; Notable for the following components:   Color, Urine COLORLESS (*)    All other components within normal limits  SARS CORONAVIRUS 2 (TAT 6-24 HRS)  CALCIUM, IONIZED  MAGNESIUM  POC OCCULT BLOOD, ED  TROPONIN I (HIGH SENSITIVITY)  TROPONIN I (HIGH SENSITIVITY)    EKG None  Radiology DG Chest Port 1 View  Result Date: 10/23/2020 CLINICAL DATA:  Left-sided chest pain EXAM: PORTABLE CHEST 1 VIEW COMPARISON:  10/04/2020 FINDINGS: The heart size and mediastinal contours are within normal limits. Mildly hyperinflated lungs. No focal airspace consolidation, pleural effusion, or pneumothorax. The visualized skeletal structures are  unremarkable. IMPRESSION: No active disease. Electronically Signed   By: Davina Poke D.O.   On: 10/23/2020 14:13    Procedures Procedures   Medications Ordered in ED Medications  alum & mag hydroxide-simeth (MAALOX/MYLANTA) 200-200-20 MG/5ML suspension 30 mL (30 mLs Oral Given 10/23/20  1332)    And  lidocaine (XYLOCAINE) 2 % viscous mouth solution 15 mL (15 mLs Oral Given 10/23/20 1333)  calcium gluconate 1 g/ 50 mL sodium chloride IVPB (1,000 mg Intravenous New Bag/Given 10/23/20 1455)    ED Course  I have reviewed the triage vital signs and the nursing notes.  Pertinent labs & imaging results that were available during my care of the patient were reviewed by me and considered in my medical decision making (see chart for details).  Clinical Course as of 10/23/20 1628  Fri Oct 23, 2020  1434 Troponin I (High Sensitivity): 3 [MV]  1434 Calcium(!): 6.9 [MV]  1553 Fecal Occult Blood, POC: NEGATIVE [MV]    Clinical Course User Index [MV] Eustaquio Maize, PA-C   MDM Rules/Calculators/A&P                          50 year old male who presents to the ED today with complaint of left-sided chest pain started about 45 minutes ago with associated shortness of breath.  Had an episode of nausea and nonbloody nonbilious emesis earlier this morning several hours before chest pain began.  Declined nitroglycerin with EMS due to taking Viagra last night.  On arrival to the ED patient is afebrile tachycardic and nontachypneic and appears to be in no acute distress.  Patient is requesting a GI cocktail at this time as he states that it typically helps with the pain.  Does appear during past ED visits for chest pain that he has been given GI cocktail with lidocaine with improvement.  His work-up is always negative for ACS.  He is a current every day smoker.   EKG not crossing over however no acute ischemic changes CBC without leukocytosis. Hgb 9.0. Fecal occult negative.   Hemoglobin  Date Value Ref Range Status  10/23/2020 9.0 (L) 13.0 - 17.0 g/dL Final  10/04/2020 10.9 (L) 13.0 - 17.0 g/dL Final  08/15/2020 12.0 (L) 13.0 - 17.0 g/dL Final  08/10/2020 12.1 (L) 13.0 - 17.0 g/dL Final  05/22/2020 11.9 (L) 13.0 - 17.7 g/dL Final   BMP with glucose 80, bicarb 16, and chloride 119. Does  appear bicarb is normally low. NO gap. Calcium of note to be at 6.9. Will add on magnesium and calcium ionized lab at this time and provide 1 g calcium gluconate. Question if this is causing pt's "jerking." Discussed case with attending physician Dr. Tamera Punt who agrees with calcium gluconate and given pt is symptomatic does recommend admission for hypocalcemia today.  Troponin of 3 with repeat 3. Pt reports no improvement with his chest pain; it does appear he takes percocet last prescribed 06/14. Will provide dose of pain meds at this time.   Discussed case with Internal Medicine who agrees to come evaluate patient for admission. Appreciate their involvement.   This note was prepared using Dragon voice recognition software and may include unintentional dictation errors due to the inherent limitations of voice recognition software.  Final Clinical Impression(s) / ED Diagnoses Final diagnoses:  Hypocalcemia  Nonspecific chest pain    Rx / DC Orders ED Discharge Orders     None        Alroy Bailiff, Annebelle Bostic,  PA-C 10/23/20 1628    Malvin Johns, MD 10/23/20 1630

## 2020-10-23 NOTE — ED Notes (Signed)
Pt continues to attempt to go out in lobby and meet family member and come back in. Family member unable to come back as she has minor with her. Pt has asked 3-4 times to go out to lobby and come back in. Advised pt he is unable to continue leaving room and going outside/lobby.

## 2020-10-24 DIAGNOSIS — D649 Anemia, unspecified: Secondary | ICD-10-CM | POA: Diagnosis not present

## 2020-10-24 DIAGNOSIS — K862 Cyst of pancreas: Secondary | ICD-10-CM

## 2020-10-24 DIAGNOSIS — K625 Hemorrhage of anus and rectum: Secondary | ICD-10-CM | POA: Diagnosis not present

## 2020-10-24 DIAGNOSIS — K860 Alcohol-induced chronic pancreatitis: Secondary | ICD-10-CM | POA: Diagnosis not present

## 2020-10-24 DIAGNOSIS — R079 Chest pain, unspecified: Secondary | ICD-10-CM

## 2020-10-24 LAB — CBC
HCT: 28.6 % — ABNORMAL LOW (ref 39.0–52.0)
Hemoglobin: 8.9 g/dL — ABNORMAL LOW (ref 13.0–17.0)
MCH: 31.9 pg (ref 26.0–34.0)
MCHC: 31.1 g/dL (ref 30.0–36.0)
MCV: 102.5 fL — ABNORMAL HIGH (ref 80.0–100.0)
Platelets: 208 10*3/uL (ref 150–400)
RBC: 2.79 MIL/uL — ABNORMAL LOW (ref 4.22–5.81)
RDW: 15.2 % (ref 11.5–15.5)
WBC: 9.6 10*3/uL (ref 4.0–10.5)
nRBC: 0 % (ref 0.0–0.2)

## 2020-10-24 LAB — COMPREHENSIVE METABOLIC PANEL
ALT: 51 U/L — ABNORMAL HIGH (ref 0–44)
AST: 52 U/L — ABNORMAL HIGH (ref 15–41)
Albumin: 3.4 g/dL — ABNORMAL LOW (ref 3.5–5.0)
Alkaline Phosphatase: 68 U/L (ref 38–126)
Anion gap: 9 (ref 5–15)
BUN: 12 mg/dL (ref 6–20)
CO2: 21 mmol/L — ABNORMAL LOW (ref 22–32)
Calcium: 9.1 mg/dL (ref 8.9–10.3)
Chloride: 106 mmol/L (ref 98–111)
Creatinine, Ser: 0.76 mg/dL (ref 0.61–1.24)
GFR, Estimated: >60 mL/min (ref >60)
Glucose, Bld: 128 mg/dL — ABNORMAL HIGH (ref 70–99)
Potassium: 3.9 mmol/L (ref 3.5–5.1)
Sodium: 136 mmol/L (ref 135–145)
Total Bilirubin: 0.5 mg/dL (ref 0.3–1.2)
Total Protein: 6.4 g/dL — ABNORMAL LOW (ref 6.5–8.1)

## 2020-10-24 LAB — IRON AND TIBC
Iron: 57 ug/dL (ref 45–182)
Saturation Ratios: 11 % — ABNORMAL LOW (ref 17.9–39.5)
TIBC: 539 ug/dL — ABNORMAL HIGH (ref 250–450)
UIBC: 482 ug/dL

## 2020-10-24 LAB — FERRITIN: Ferritin: 15 ng/mL — ABNORMAL LOW (ref 24–336)

## 2020-10-24 LAB — VITAMIN D 25 HYDROXY (VIT D DEFICIENCY, FRACTURES): Vit D, 25-Hydroxy: 25.04 ng/mL — ABNORMAL LOW (ref 30–100)

## 2020-10-24 LAB — MAGNESIUM: Magnesium: 1.3 mg/dL — ABNORMAL LOW (ref 1.7–2.4)

## 2020-10-24 LAB — PHOSPHORUS: Phosphorus: 4.2 mg/dL (ref 2.5–4.6)

## 2020-10-24 LAB — CALCIUM, IONIZED: Calcium, Ionized, Serum: 5.2 mg/dL (ref 4.5–5.6)

## 2020-10-24 MED ORDER — SUCRALFATE 1 GM/10ML PO SUSP
1.0000 g | Freq: Once | ORAL | Status: AC | PRN
  Administered 2020-10-24: 1 g via ORAL
  Filled 2020-10-24 (×2): qty 10

## 2020-10-24 MED ORDER — MAGNESIUM SULFATE 2 GM/50ML IV SOLN
2.0000 g | Freq: Once | INTRAVENOUS | Status: AC
Start: 2020-10-24 — End: 2020-10-24
  Administered 2020-10-24: 2 g via INTRAVENOUS
  Filled 2020-10-24: qty 50

## 2020-10-24 MED ORDER — OXYCODONE-ACETAMINOPHEN 5-325 MG PO TABS
1.0000 | ORAL_TABLET | ORAL | Status: DC | PRN
  Administered 2020-10-24 (×4): 1 via ORAL
  Filled 2020-10-24 (×5): qty 1

## 2020-10-24 NOTE — ED Notes (Signed)
Pt requesting pain medication again, advised pt that he is not due for another 2 hours for percocet. Per discussion w/ MD Marianna Payment, due to high risk for elopement and labile behavior, pt remains without IV access.

## 2020-10-24 NOTE — Progress Notes (Signed)
   10/24/20 1613  Clinical Encounter Type  Visited With Patient  Visit Type Initial;Spiritual support;Social support  Referral From Nurse  Consult/Referral To Chaplain  Spiritual Encounters  Spiritual Needs Prayer;[facility] [employer] text;Literature;Emotional  Stress Factors  Patient Stress Factors Health changes;Other (Comment) (finding a substance abuse treatment program)   Chaplain responded to page from [name] nurse, Gwenlyn Perking as Pt requested to speak to [place]. Pt is worried about a procedure he has scheduled tomorrow. Pt requested religious reading material and help finding a residential substance abuse treatment program. Chaplain told Pt that the [facility] team can help with discharge plans and let [name] RN, Gwenlyn Perking, know that Pt requested this information. Pt engaged active listening and provided emotional and spiritual support. Pt wants to get his life on track for his grandson. Chaplain will return later with reading material and some other resource information.   1830- Chaplain dropped off reading material, Bible and resource information with Pt. Chaplain remains available.  This note was prepared by Chaplain Resident, Dante Gang, MDiv. Chaplain remains available as needed through the on-call pager: [phone].

## 2020-10-24 NOTE — ED Notes (Signed)
Attempted report x1. 

## 2020-10-24 NOTE — ED Notes (Signed)
MD Coe to bedside to speak w/ pt

## 2020-10-24 NOTE — ED Notes (Signed)
Pt requesting to page MD, will not disclose his need. Page sent to MD Marianna Payment .

## 2020-10-24 NOTE — Consult Note (Signed)
Referring Provider: Dr. Velna Ochs  Primary Care Physician:  Simona Huh, NP Primary Gastroenterologist:  Dr. Havery Moros   Reason for Consultation: Anemia, rectal bleeding   HPI: [name] is a 50 y.o. male with a past medical history of anxiety, depression, bipolar disorder, alcohol use disorder, cocaine use, hypertension, WPW, sickle cell trait, GERD, duodenal ulcers and alcohol-related acute and chronic pancreatitis followed by Dr. Havery Moros.  He presented to [facility] ED on 10/23/2020 with complaints of left-sided chest pain which radiated to his left arm with shortness of breath.  He also noted having involuntary jerking movements to his right arm and leg.  He reported passing stools with bright red blood per the rectum.  Labs in the ED showed a hemoglobin level of 9.0 -> 8.9 ( Hg 10.9 on 10/04/2020 and Hg 12 of 08/15/2020).  WBC 8.3.  Lipase 55.  Alk phos 68.  AST 59.  ALT 56.  Total bili 0.5.  SARS coronavirus 2 negative.  An abdominal/pelvic CT scan showed evidence of chronic pancreatitis with cystic dilatation of the pancreatic duct and enlarging pancreatic pseudocysts with gastric wall thickening.  A GI consult was requested for further evaluation regarding his rectal bleeding and anemia.  He passed a normal brown stool on Wed 6/15 with a small amount of red blood. Thursday 6/16 he passed 2 bloody bowel movements. Friday 6/17 he passed a dark maroon colored stool. He reported having N/V with epigastric pain for the past 2 days. Thursday he vomited black colored emesis and Friday he vomited brown colored emesis. He developed CP and SOB yesterday. He drinks 2 beers 5 days weekly. Smokes marijuana a few days weekly and he last used cocaine 2 weeks ago. He has intermittent heartburn, no dysphagia. He takes Omeprazole 72m po bid. No NSAID use. No other complaints at this time.   Laboratory study 10/24/2020: Sodium 136.  Potassium 3.9.  Glucose 128.  BUN 12.  Calcium  9.1.  Magnesium 1.3.  Alk phos 68.  Albumin 3.4.  AST 52.  ALT 51.  Total bili 0.5.  WBC 9.6.  Hemoglobin 8.9.  Hematocrit 28.6.  MCV 102.5.  Platelet 208.  CTAP 10/23/2020: 1. Sequela from chronic calcific pancreatitis, with continued cystic dilatation of the pancreatic duct and enlarging pancreatic pseudocysts as above. No acute inflammatory changes. 2. Chronic gastric wall thickening which may reflect sequela from gastritis. Endoscopy may be useful if not previously performed. 3.  Aortic Atherosclerosis   Colonoscopy 11/13/2019: - The examined portion of the ileum was normal. - Two 3 to 7 mm polyps in the transverse colon, removed with a cold snare. Resected and retrieved. - One 3 to 4 mm polyp in the sigmoid colon, removed with a cold snare. Resected and retrieved. - Internal hemorrhoids. - The examination was otherwise normal. - Biopsies were taken with a cold forceps from the right colon, left colon and transverse colon for evaluation of microscopic colitis.  EGD 11/13/2019: - Esophagogastric landmarks identified. - Multiple plaques in the upper third of the esophagus, consistent with esophageal candidiasis. - Gastritis. - Normal stomach otherwise - biopsies taken to rule out H pylori - Non-bleeding duodenal ulcer with no stigmata of bleeding. Biopsied. - Duodenitis.  Biopsy Result: 1. Surgical [P], duodenal ulcer - PEPTIC DUODENITIS. EVIDENCE OF HEALING EROSION/ULCER. - WARTHIN-STARRY STAIN IS NEGATIVE FOR HELICOBACTER PYLORI. 2. Surgical [P], gastric antrum and gastric body - MILD CHRONIC GASTRITIS. MILD REACTION GASTROPATHY. - WARTHIN-STARRY STAIN IS NEGATIVE FOR HELICOBACTER PYLORI. 3. Surgical [  P], colon, transverse, sigmoid, polyp (3) - TUBULAR ADENOMA, NEGATIVE FOR HIGH GRADE DYSPLASIA (X1). -HYPERPLASTIC POLYP (X2). 4. Surgical [P], colon, random sites - NO SIGNIFICANT PATHOLOGIC FINDINGS. - NEGATIVE FOR ACTIVE INFLAMMATION AND OTHER ABNORMALITIES.  EUS  01/02/2020: - Medium sized (4.8cm) thick walled cystic lesion in the body of the pancreas. This was nearly completley aspirated, yielding 120cc of brownish, old blood tinged fluid with solid flecks of tissue which was sent for cytology, CEA and amylase. - Following cyst aspiration the pancreatic parenchyma showed pretty clear signs of chronic pancreatitis but no obvious mass lesions (except to the residual 'rind' from the cyst aspiration above). The changes of chronic pancreatitis signficantly limit the sensitivity of EUS (and other imaging modalities) to detect neolasm. - The previously noted duodenal bulb ulcer is much improved in the interim.  Past Medical History:  Diagnosis Date   Alcoholism /alcohol abuse    Anemia    Anxiety    Arthritis    "knees; arms; elbows" (03/26/2015)   Asthma    Bipolar disorder (HCC)    Chronic bronchitis (HCC)    Chronic lower back pain    Chronic pancreatitis ([place])    Cocaine abuse ([place])    Depression    Family history of adverse reaction to anesthesia    "grandmother gets confused"   Femoral condyle fracture ([place]) 03/08/2014   left medial/notes 03/09/2014   GERD (gastroesophageal reflux disease)    H/O hiatal hernia    H/O suicide attempt 10/2012   High cholesterol    History of blood transfusion 10/2012   "when I tried to commit suicide"   History of stomach ulcers    Hypertension    Marijuana abuse, continuous    Migraine    "a few times/year" (03/26/2015)   Pneumonia 1990's X 3   PTSD (post-traumatic stress disorder)    Sickle cell trait ([place])    WPW (Wolff-Parkinson-White syndrome)    [name] 03/06/2013    Past Surgical History:  Procedure Laterality Date   BIOPSY  11/25/2017   Procedure: BIOPSY;  Surgeon: Arta Silence, MD;  Location: [place];  Service: Endoscopy;;   BIOPSY  10/14/2018   Procedure: BIOPSY;  Surgeon: Arta Silence, MD;  Location: [place];  Service: Endoscopy;;   CARDIAC CATHETERIZATION     CYST  ENTEROSTOMY  01/02/2020   Procedure: CYST ASPIRATION;  Surgeon: Milus Banister, MD;  Location: [facility];  Service: Endoscopy;;   ESOPHAGOGASTRODUODENOSCOPY (EGD) WITH PROPOFOL N/A 11/25/2017   Procedure: ESOPHAGOGASTRODUODENOSCOPY (EGD) WITH PROPOFOL;  Surgeon: Arta Silence, MD;  Location: [place];  Service: Endoscopy;  Laterality: N/A;   ESOPHAGOGASTRODUODENOSCOPY (EGD) WITH PROPOFOL Left 10/14/2018   Procedure: ESOPHAGOGASTRODUODENOSCOPY (EGD) WITH PROPOFOL;  Surgeon: Arta Silence, MD;  Location: [facility];  Service: Endoscopy;  Laterality: Left;   ESOPHAGOGASTRODUODENOSCOPY (EGD) WITH PROPOFOL N/A 11/14/2018   Procedure: ESOPHAGOGASTRODUODENOSCOPY (EGD) WITH PROPOFOL;  Surgeon: Laurence Spates, MD;  Location: [facility];  Service: Gastroenterology;  Laterality: N/A;   ESOPHAGOGASTRODUODENOSCOPY (EGD) WITH PROPOFOL N/A 01/02/2020   Procedure: ESOPHAGOGASTRODUODENOSCOPY (EGD) WITH PROPOFOL;  Surgeon: Milus Banister, MD;  Location: [facility];  Service: Endoscopy;  Laterality: N/A;   EUS N/A 01/02/2020   Procedure: UPPER ENDOSCOPIC ULTRASOUND (EUS) RADIAL;  Surgeon: Milus Banister, MD;  Location: [facility];  Service: Endoscopy;  Laterality: N/A;   EYE SURGERY Left 1990's   "result of trauma"    FACIAL FRACTURE SURGERY Left 1990's   "result of trauma"    FRACTURE SURGERY  HERNIA REPAIR     LEFT HEART CATHETERIZATION WITH CORONARY ANGIOGRAM Right 03/07/2013   Procedure: LEFT HEART CATHETERIZATION WITH CORONARY ANGIOGRAM;  Surgeon: [name], MD;  Location: [place] CATH LAB;  Service: Cardiovascular;  Laterality: Right;   UMBILICAL HERNIA REPAIR     UPPER GASTROINTESTINAL ENDOSCOPY      Prior to Admission medications   Medication Sig Start Date End Date Taking? Authorizing Provider  albuterol (VENTOLIN HFA) 108 (90 Base) MCG/ACT inhaler Inhale 2 puffs into the lungs every 4 (four) hours as needed for wheezing or shortness of breath. 11/12/19  Yes Noemi Chapel P, DO   Cyanocobalamin (VITAMIN B-12 PO) Take 1 tablet by mouth daily.   Yes [provider]  cyclobenzaprine (FLEXERIL) 10 MG tablet Take 1 tablet by mouth 3 (three) times daily as needed for muscle spasms. 10/20/20  Yes [provider]  fluticasone furoate-vilanterol (BREO ELLIPTA) 200-25 MCG/INH AEPB Inhale 1 puff into the lungs daily. Patient taking differently: Inhale 1 puff into the lungs daily as needed. 11/12/19  Yes [name], DO  folic acid (FOLVITE) 1 MG tablet Take 1 tablet (1 mg total) by mouth daily. 11/26/17  Yes [name], MD  hydrALAZINE (APRESOLINE) 25 MG tablet Take 1 tablet (25 mg total) by mouth 3 (three) times daily. 03/27/20  Yes Newlin, Charlane Ferretti, MD  lidocaine (XYLOCAINE) 2 % solution Use as directed 15 mLs in the mouth or throat every 6 (six) hours as needed for mouth pain. 05/25/20  Yes [name], MD  lipase/protease/amylase (CREON) 36000 UNITS CPEP capsule Take 2 capsules (72,000 Units total) by mouth 3 (three) times daily with meals. For pancreatitis Patient taking differently: Take 72,000 Units by mouth 3 (three) times daily with meals. 11/05/19  Yes [name], [name], MD  metoprolol (TOPROL-XL) 200 MG 24 hr tablet Take 200 mg by mouth daily. 08/20/20  Yes [provider]  Multiple Vitamin (MULTIVITAMIN WITH MINERALS) TABS tablet Take 1 tablet by mouth daily. 09/06/17  Yes Bonnell Public, MD  omeprazole (PRILOSEC) 40 MG capsule Take once to twice daily as needed Patient taking differently: Take 40 mg by mouth daily. 04/07/20  Yes [name], [name], MD  ondansetron (ZOFRAN-ODT) 4 MG disintegrating tablet Take 4 mg by mouth 2 (two) times daily as needed for vomiting or nausea. 10/20/20  Yes [provider]  oxyCODONE-acetaminophen (PERCOCET/ROXICET) 5-325 MG tablet Take 1 tablet by mouth 4 (four) times daily as needed for moderate pain or severe pain. 10/20/20  Yes [provider]  potassium chloride (MICRO-K) 10 MEQ  CR capsule Take 10 mEq by mouth daily as needed (low potassium). 08/12/20  Yes [provider]  QUEtiapine (SEROQUEL) 50 MG tablet Take 1 tablet (50 mg total) by mouth at bedtime. 10/11/20  Yes Lucky Rathke, FNP  sertraline (ZOLOFT) 100 MG tablet Take 1 tablet (100 mg total) by mouth daily. 03/27/20  Yes [name], MD  sucralfate (CARAFATE) 1 g tablet Take 1 tablet (1 g total) by mouth 4 (four) times daily -  with meals and at bedtime. 05/23/20  Yes Kerin Perna, NP  thiamine 100 MG tablet Take 1 tablet (100 mg total) by mouth daily. 10/16/18  Yes [name], MD  verapamil (CALAN-SR) 120 MG CR tablet Take 120 mg by mouth daily. 08/20/20  Yes [provider]  Vitamin D, Ergocalciferol, (DRISDOL) 1.25 MG (50000 UNIT) CAPS capsule Take 1 capsule by mouth once a week. 10/19/20  Yes [provider]  metoprolol tartrate (LOPRESSOR) 100 MG tablet Take 1 tablet (100 mg total) by mouth 2 (two) times daily. Patient not taking: No sig reported 05/23/20   Kerin Perna, NP  amitriptyline (ELAVIL) 25 MG tablet Take 1 tablet (25 mg total) by mouth at bedtime. Patient not taking: Reported on 08/08/2019 10/15/18 08/08/19  [name], MD  gabapentin (NEURONTIN) 100 MG capsule Take 1 capsule (100 mg total) by mouth 2 (two) times daily. Patient not taking: Reported on 08/08/2019 10/15/18 08/08/19  [name], MD  pantoprazole (PROTONIX) 40 MG tablet Take 1 tablet (40 mg total) by mouth 2 (two) times daily. Patient not taking: Reported on 11/17/2019 11/05/19 03/10/20  Yetta Flock, MD  promethazine (PHENERGAN) 25 MG tablet Take 1 tablet (25 mg total) by mouth every 6 (six) hours as needed for nausea or vomiting. Patient not taking: Reported on 08/08/2019 03/02/19 08/08/19  Kinnie Feil, PA-C    Current Facility-Administered Medications  Medication Dose Route Frequency Provider Last Rate Last Admin   albuterol (PROVENTIL) (2.5 MG/3ML) 0.083% nebulizer solution 3 mL  3  mL Inhalation Q4H PRN [name], MD       fluticasone furoate-vilanterol (BREO ELLIPTA) 200-25 MCG/INH 1 puff  1 puff Inhalation Daily [name], MD   1 puff at 31/54/00 8676   folic acid (FOLVITE) tablet 1 mg  1 mg Oral Daily [name], MD   1 mg at 10/24/20 1216   HYDROmorphone (DILAUDID) injection 0.5-1 mg  0.5-1 mg Intravenous Q2H PRN [name], MD   0.5 mg at 10/24/20 0853   lidocaine (XYLOCAINE) 2 % viscous mouth solution 15 mL  15 mL Mouth/Throat Q6H PRN [name], MD       lipase/protease/amylase (CREON) capsule 72,000 Units  72,000 Units Oral TID WC [name], MD   72,000 Units at 10/24/20 1218   LORazepam (ATIVAN) tablet 1-4 mg  1-4 mg Oral Q1H PRN [name], MD       Or   LORazepam (ATIVAN) injection 1-4 mg  1-4 mg Intravenous Q1H PRN [name], MD       multivitamin with minerals tablet 1 tablet  1 tablet Oral Daily [name], MD   1 tablet at 10/24/20 1217   oxyCODONE-acetaminophen (PERCOCET/ROXICET) 5-325 MG per tablet 1 tablet  1 tablet Oral Q4H PRN Marianna Payment, MD   1 tablet at 10/24/20 1216   pantoprazole (PROTONIX) EC tablet 40 mg  40 mg Oral BID [name], MD   40 mg at 10/24/20 1216   sertraline (ZOLOFT) tablet 100 mg  100 mg Oral Daily [name], MD   100 mg at 10/24/20 1218   sucralfate (CARAFATE) 1 GM/10ML suspension 1 g  1 g Oral Once PRN Marianna Payment, MD       sucralfate (CARAFATE) tablet 1 g  1 g Oral TID WC & HS [name], MD   1 g at 10/24/20 1216   thiamine tablet 100 mg  100 mg Oral Daily [name], MD   100 mg at 10/24/20 1216   [START ON 10/28/2020] Vitamin D (Ergocalciferol) (DRISDOL) capsule 50,000 Units  50,000 Units Oral Q Laurina Bustle, MD       Current Outpatient Medications  Medication Sig Dispense Refill   albuterol (VENTOLIN HFA) 108 (90 Base) MCG/ACT inhaler Inhale 2 puffs into the lungs every 4 (four) hours as needed for wheezing or shortness of breath. 18 g 11   Cyanocobalamin (VITAMIN B-12 PO) Take 1 tablet  by  mouth daily.     cyclobenzaprine (FLEXERIL) 10 MG tablet Take 1 tablet by mouth 3 (three) times daily as needed for muscle spasms.     fluticasone furoate-vilanterol (BREO ELLIPTA) 200-25 MCG/INH AEPB Inhale 1 puff into the lungs daily. (Patient taking differently: Inhale 1 puff into the lungs daily as needed.) 1 each 11   folic acid (FOLVITE) 1 MG tablet Take 1 tablet (1 mg total) by mouth daily. 30 tablet 0   hydrALAZINE (APRESOLINE) 25 MG tablet Take 1 tablet (25 mg total) by mouth 3 (three) times daily. 90 tablet 3   lidocaine (XYLOCAINE) 2 % solution Use as directed 15 mLs in the mouth or throat every 6 (six) hours as needed for mouth pain. 100 mL 0   lipase/protease/amylase (CREON) 36000 UNITS CPEP capsule Take 2 capsules (72,000 Units total) by mouth 3 (three) times daily with meals. For pancreatitis (Patient taking differently: Take 72,000 Units by mouth 3 (three) times daily with meals.) 180 capsule 5   metoprolol (TOPROL-XL) 200 MG 24 hr tablet Take 200 mg by mouth daily.     Multiple Vitamin (MULTIVITAMIN WITH MINERALS) TABS tablet Take 1 tablet by mouth daily. 30 tablet 0   omeprazole (PRILOSEC) 40 MG capsule Take once to twice daily as needed (Patient taking differently: Take 40 mg by mouth daily.) 180 capsule 1   ondansetron (ZOFRAN-ODT) 4 MG disintegrating tablet Take 4 mg by mouth 2 (two) times daily as needed for vomiting or nausea.     oxyCODONE-acetaminophen (PERCOCET/ROXICET) 5-325 MG tablet Take 1 tablet by mouth 4 (four) times daily as needed for moderate pain or severe pain.     potassium chloride (MICRO-K) 10 MEQ CR capsule Take 10 mEq by mouth daily as needed (low potassium).     QUEtiapine (SEROQUEL) 50 MG tablet Take 1 tablet (50 mg total) by mouth at bedtime. 30 tablet 0   sertraline (ZOLOFT) 100 MG tablet Take 1 tablet (100 mg total) by mouth daily. 30 tablet 3   sucralfate (CARAFATE) 1 g tablet Take 1 tablet (1 g total) by mouth 4 (four) times daily -  with meals and  at bedtime. 120 tablet 3   thiamine 100 MG tablet Take 1 tablet (100 mg total) by mouth daily. 30 tablet 0   verapamil (CALAN-SR) 120 MG CR tablet Take 120 mg by mouth daily.     Vitamin D, Ergocalciferol, (DRISDOL) 1.25 MG (50000 UNIT) CAPS capsule Take 1 capsule by mouth once a week.     metoprolol tartrate (LOPRESSOR) 100 MG tablet Take 1 tablet (100 mg total) by mouth 2 (two) times daily. (Patient not taking: No sig reported) 60 tablet 3    Allergies as of 10/23/2020 - Review Complete 10/23/2020  Allergen Reaction Noted   Robaxin [methocarbamol] Other (See Comments) 07/20/2016   Shellfish-derived products Nausea And Vomiting 08/18/2010   Trazodone and nefazodone Other (See Comments) 09/28/2011   Adhesive [tape] Itching 03/17/2018   Latex Itching 03/17/2018   Toradol [ketorolac tromethamine] Other (See Comments) 03/17/2018   Contrast media [iodinated diagnostic agents] Hives 12/04/2016   Reglan [metoclopramide] Other (See Comments) 11/18/2015    Family History  Problem Relation Age of Onset   Hypertension Mother    Cirrhosis Father    Alcoholism Father    Hypertension Father    Melanoma Father    Hypertension Other    Coronary artery disease Other     Social History   Socioeconomic History   Marital status: Single    Spouse  name: Not on file   Number of children: Not on file   Years of education: Not on file   Highest education level: Not on file  Occupational History   Occupation: disabled  Tobacco Use   Smoking status: Every Day    Packs/day: 1.00    Years: 36.00    Pack years: 36.00    Types: Cigarettes, E-cigarettes   Smokeless tobacco: Never  Vaping Use   Vaping Use: Former  Substance and Sexual Activity   Alcohol use: Yes    Comment: last drink 8 days ago   Drug use: Yes    Types: Marijuana, Cocaine    Comment: daily marijuana use; last cocaine use about 3 months ago   Sexual activity: Yes    Birth control/protection: None  Other Topics Concern   Not  on file  Social History Narrative   ** Merged History Encounter **       Social Determinants of Health   Financial Resource Strain: Not on file  Food Insecurity: Not on file  Transportation Needs: Not on file  Physical Activity: Not on file  Stress: Not on file  Social Connections: Not on file  Intimate Partner Violence: Not on file    Review of Systems: See HPI, all other systems reviewed were negative   Physical Exam: Vital signs in last 24 hours: Temp:  [98.5 F (36.9 C)] 98.5 F (36.9 C) (06/18 1414) Pulse Rate:  [27-91] 78 (06/18 1414) Resp:  [12-33] 17 (06/18 1414) BP: (107-136)/(57-96) 108/77 (06/18 1414) SpO2:  [97 %-100 %] 100 % (06/18 1414)   General:  Alert 50 year old male in NAD. Head:  Normocephalic and atraumatic. Eyes:  No scleral icterus. Conjunctiva pink. Ears:  Normal auditory acuity. Nose:  No deformity, discharge or lesions. Mouth:  Poor dentition. No ulcers or lesions.  Neck:  Supple. No lymphadenopathy or thyromegaly.  Lungs: Breath sounds clear throughout.  Heart:  RRR, no murmur.  Abdomen:  Soft, tenderness to the LUQ, epigastric and RUQ without rebound or guarding.  Rectal: No external hemorrhoids. No prolapsed internal hemorrhoids. No stool or blood in the rectal vault. Albert RN present at time of exam. Musculoskeletal:  Symmetrical without gross deformities.  Pulses:  Normal pulses noted. Extremities:  Without clubbing or edema. Neurologic:  Alert and  oriented x 4. No focal deficits.  Skin:  Intact without significant lesions or rashes. Psych:  Alert and cooperative. Normal mood and affect.  Intake/Output from previous day: No intake/output data recorded. Intake/Output this shift: Total I/O In: 50 [IV Piggyback:50] Out: 475 [Urine:475]  Lab Results: Recent Labs    10/23/20 1328 10/24/20 0226  WBC 8.3 9.6  HGB 9.0* 8.9*  HCT 27.7* 28.6*  PLT PLATELET CLUMPS NOTED ON SMEAR, COUNT APPEARS ADEQUATE 208   BMET Recent Labs     10/23/20 1328 10/23/20 1804 10/24/20 0226  NA 141 137 136  K 3.9 4.4 3.9  CL 119* 107 106  CO2 16* 22 21*  GLUCOSE 90 102* 128*  BUN _0 CREATININE 0.53* 0.70 0.76  CALCIUM 6.9* 9.4 9.1   LFT Recent Labs    10/24/20 0226  PROT 6.4*  ALBUMIN 3.4*  AST 52*  ALT 51*  ALKPHOS 68  BILITOT 0.5   PT/INR No results for input(s): LABPROT, INR in the last 72 hours. Hepatitis Panel No results for input(s): HEPBSAG, HCVAB, HEPAIGM, HEPBIGM in the last 72 hours.    Studies/Results: CT ABDOMEN PELVIS WO CONTRAST  Result  Date: 10/23/2020 CLINICAL DATA:  Left-sided chest pain, epigastric pain EXAM: CT ABDOMEN AND PELVIS WITHOUT CONTRAST TECHNIQUE: Multidetector CT imaging of the abdomen and pelvis was performed following the standard protocol without IV contrast. COMPARISON:  10/23/2020, 08/08/2019, 11/22/2019 FINDINGS: Lower chest: No acute pleural or parenchymal lung disease. Unenhanced CT was performed per clinician order. Lack of IV contrast limits sensitivity and specificity, especially for evaluation of abdominal/pelvic solid viscera. Hepatobiliary: No focal liver abnormality is seen. No gallstones, gallbladder wall thickening, or biliary dilatation. Pancreas: Diffuse calcifications are seen throughout the pancreas consistent with sequela of chronic calcific pancreatitis. No acute inflammatory changes are seen. Multiple cystic areas are seen throughout the pancreatic parenchyma, largest at the junction of the body and tail measuring 4.6 x 4.8 cm, most consistent with pancreatic pseudocyst. Areas of cystic pancreatic duct dilation are also noted, unchanged since prior MRI. Spleen: Normal in size without focal abnormality. Adrenals/Urinary Tract: No urinary tract calculi or obstructive uropathy. The adrenals and bladder are unremarkable. Stomach/Bowel: No bowel obstruction or ileus. There is moderate retained stool throughout the colon. Normal appendix right lower quadrant. Chronic gastric  wall thickening may reflect gastritis. Endoscopy recommended if not previously performed. Vascular/Lymphatic: Aortic atherosclerosis. No enlarged abdominal or pelvic lymph nodes. Reproductive: Prostate is unremarkable. Other: No free fluid or free gas.  No abdominal wall hernia. Musculoskeletal: No acute or destructive bony lesions. Reconstructed images demonstrate no additional findings. IMPRESSION: 1. Sequela from chronic calcific pancreatitis, with continued cystic dilatation of the pancreatic duct and enlarging pancreatic pseudocysts as above. No acute inflammatory changes. 2. Chronic gastric wall thickening which may reflect sequela from gastritis. Endoscopy may be useful if not previously performed. 3.  Aortic Atherosclerosis (ICD10-I70.0). Electronically Signed   By: Randa Ngo M.D.   On: 10/23/2020 23:39   DG Chest Port 1 View  Result Date: 10/23/2020 CLINICAL DATA:  Left-sided chest pain EXAM: PORTABLE CHEST 1 VIEW COMPARISON:  10/04/2020 FINDINGS: The heart size and mediastinal contours are within normal limits. Mildly hyperinflated lungs. No focal airspace consolidation, pleural effusion, or pneumothorax. The visualized skeletal structures are unremarkable. IMPRESSION: No active disease. Electronically Signed   By: Davina Poke D.O.   On: 10/23/2020 14:13    IMPRESSION/PLAN:  52.  50 year old male with a history of GERD and duodenal ulcers with N/V, dark brown and black emesis with upper abdominal pain.  -Consider EGD, await further recommendations from Dr. Rush Landmark  -PPI bid -Clear liquid diet for now -Pain management per the hospitalist   2. Rectal bleeding, history of internal hemorrhoids. No evidence of prolapsed or inflamed hemorrhoids on exam.  -Consider flexible sigmoidoscopy vs full colonoscopy at time of EGD  3. Acute on chronic anemia, multifactorial secondary t#1, rectal bleeding and sickle cell trait -H/H Q 6 hours x 24 hours  -Transfuse for Hg < 8  3. History of  acute and chronic pancreatitis secondary to alcohol use disorder. CTAP WO contrast showed sequela from chronic calcific pancreatitis, with continued cystic dilatation of the pancreatic duct and enlarging pancreatic pseudocysts as above. No acute inflammatory changes. Gastric wall thickening.  -Dr. Rush Landmark to review CT  4. History of tubular adenomatous polyps per colonoscopy   5. Sickle cell anemia/trait  6. Polysubstance abuse   Noralyn Pick  10/24/2020, 2:34 PM

## 2020-10-24 NOTE — Progress Notes (Signed)
    Subjective:  Reason for page/message: walking outside of the ED and back into his room.  I was notified at 11:44 pm  by ED nurse that Mr. [name] was leaving his room to go outside briefly and then came back inside. The patient's nurse states that she counseled him regarding the importance of not leaving his room to keep him safe while we are treating him for chronic pancreatitis and etoh withdrawal. Patient apparently tried to leave the floor again. Therefore I decided to go speak with the patient regarding his behavior   Objective:   Vitals:   10/23/20 1900 10/24/20 0001  BP: 125/79 131/87  Pulse: 85 91  Resp: (!) 33 20  Temp:    SpO2: 100% 99%     Assessment/ Plan:   I counseled him to remain in his room for his safety. I told him if he want to leave then he would have to sign out AMA. He admits to understanding Patient states that he continues to have some epigastric pain despite current pain regimen, therefore, I will increase his oxycodone to q4 hour dosing.    [name], D.O.  Internal Medicine Resident, PGY-2 [facility]  Pager: [phone] 1:16 AM, 10/24/2020   Please contact the on call pager after 5 pm and on weekends at [phone].

## 2020-10-24 NOTE — Progress Notes (Signed)
Returned call for E.D. report.

## 2020-10-24 NOTE — ED Notes (Signed)
Updated response from MD Coe, changed oxy to q4. MD Coe relayed to pt that he is "not ordering any IV pain medication"during prior bedside convo

## 2020-10-24 NOTE — Progress Notes (Signed)
Subjective: Pt trying to exit to lobby because his sister was here with bags. Pt became irate and made repeated attempts towards lobby w/ IV access present stating "Fuck y'all I'm leaving this joint, you won't even get me my fucking bags." Security came to bedside. Had labile behavior. Later requesting pain medication. Given Oxycodone and carafate.  Pt seen this morning. He is feeling better. Doesn't report any more jerking movements. Still reports some pain in abdomen and nausea. Reports bleeding in stool mixed with stool. States he may be interested in inpatient rehab.  Objective:  Vital signs in last 24 hours: Vitals:   10/23/20 1800 10/23/20 1900 10/24/20 0001 10/24/20 0327  BP: 114/90 125/79 131/87 107/72  Pulse: 69 85 91 71  Resp: 13 (!) 33 20 16  Temp:      SpO2: 100% 100% 99% 97%    CBC Latest Ref Rng & Units 10/24/2020 10/23/2020 10/04/2020  WBC 4.0 - 10.5 K/uL 9.6 8.3 8.8  Hemoglobin 13.0 - 17.0 g/dL 8.9(L) 9.0(L) 10.9(L)  Hematocrit 39.0 - 52.0 % 28.6(L) 27.7(L) 34.0(L)  Platelets 150 - 400 K/uL 208 PLATELET CLUMPS NOTED ON SMEAR, COUNT APPEARS ADEQUATE 167   CMP Latest Ref Rng & Units 10/24/2020 10/23/2020 10/23/2020  Glucose 70 - 99 mg/dL 128(H) 102(H) 90  BUN 6 - 20 mg/dL 12 7 6   Creatinine 0.61 - 1.24 mg/dL 0.76 0.70 0.53(L)  Sodium 135 - 145 mmol/L 136 137 141  Potassium 3.5 - 5.1 mmol/L 3.9 4.4 3.9  Chloride 98 - 111 mmol/L 106 107 119(H)  CO2 22 - 32 mmol/L 21(L) 22 16(L)  Calcium 8.9 - 10.3 mg/dL 9.1 9.4 6.9(L)  Total Protein 6.5 - 8.1 g/dL 6.4(L) 6.3(L) -  Total Bilirubin 0.3 - 1.2 mg/dL 0.5 0.5 -  Alkaline Phos 38 - 126 U/L 68 68 -  AST 15 - 41 U/L 52(H) 59(H) -  ALT 0 - 44 U/L 51(H) 56(H) -   Physical Exam Constitutional: no acute distress HEENT: atraumatic, No icterus, mucous membranes dry. Cardiovascular: regular rate and rhythm, normal heart sounds Pulmonary: effort normal, Mild wheezing on auscultation bilaterally.  Abdominal: flat, diffuse tenderness  all over, more in the epigastric region, bowel sounds normal No Tear,  fissure, external hemorrhoids on Anal exam.  Musculoskeletal:ROM normal Skin: warm and dry Neurological: alert, no focal deficit Psychiatric: anxious mood and affect  Assessment/Plan: [name] is a 50 y.o. male with hx of hypertension, WPW pattern, alcohol abuse disorder, alcohol induced recurrent pancreatitis, sickle cell trait, bipolar disorder, MDD, presented with epigastric pain and jerking movements in his arms and legs found to have hypocalcemia with calcium 6.9.   Active Problems:   Hypocalcemia Chronic pancreatitis Peptic ulcers  Hypocalcemia (resolved) Jerking movements(resolved) Patient presented with right arm and leg jerking yesterday, found to have calcium of 6.9.  No jerking noted on examination today. likely related to chronic pancreatitis and chronic alcoholic use.  Magnesium level low at 1.3 which was repleted in the morning , Calcium 9.1 this morning, PTH level pending. Phosphorous 4.2.  Vitamin D level ordered. -Monitor symptoms. -Monitor CMP in the morning. -Continue cardiac monitoring -Follow-up PTH levels -Follow-up Vitamin D levels.    Chronic pancreatitis Abdominal pain Complains of nausea and epigastric pain this morning.  Diffuse tenderness on abdominal exam.  Patient has been getting his GI cocktail.  Patient has a history of chronic recurrent alcoholic pancreatitis. Lipase mildly elevated at 55.   CT abdomen pelvis shows Sequela from chronic calcific pancreatitis,  with continued cystic dilatation of the pancreatic duct and enlarging pancreatic pseudocysts as above. No acute inflammatory changes. Chronic gastric wall thickening which may reflect sequela from gastritis. -Start liquid diet. -Oxycodone 5-325 mg tablet 4 times daily as needed -Continue lipase 72,000 Units Orally 3 Times Daily with Meals -Dilaudid every 0.5 to 1 mg 2 hour as needed -Monitor vitals  H/o Peptic  ulcers Bright red blood mixed with stool Gastritis Likely due to Chronic alcohol use.  Anemia Complaints of bright red blood mixed with stool this morning.  No fissure, fistula, hemorrhoids on anal exam.  History of peptic ulcers. On gastric cocktail at home.  FOBT negative in ED. hemoglobin decreased from 12.1 in August to 8.9 today. -Monitor vitals -GI consult.  -Monitor CBC -Send iron studies -Continue Prilosec 40 mg twice daily. -Continue GI cocktail. -No NSAIDs   Alcohol use Patient was irritable at night but, during evaluation today.  He dinks 6 packs of beer every day.  Last CIWA 2 at 8pm yesterday (n/v, anxiety) -Continue CIWA with Ativan. -Continue thiamine, Multivitamin, folic acid. -Seizure precautions.   Asthma Denies any shortness of breath.  On Breo Ellipta at home.  Satting 97 % at room air. -Albuterol every 4 hours for wheezing. -Oxygen as needed ,keep oxygen saturation more than 92%  Hypertension BP normotensive.  BP 107/72 mmHg . Home medication include Toprol-XL 200 mg daily, hydralazine 25 mg 3 times daily, potassium chloride 10 mEq daily. Verapamil 125 mg CR tablet daily -BP controlled.  Hold home BP medications as normotensive  now.  -Monitor vitals   Depression Continues Zoloft 100 mg daily  Dispo: Anticipated discharge in approximately 1-2 day(s).   [name], MD 10/24/2020, 5:06 AM Pager: [phone] After 5pm on weekdays and 1pm on weekends: Please call On Call pager [phone]

## 2020-10-24 NOTE — ED Notes (Signed)
Attempted report x 2 

## 2020-10-24 NOTE — ED Notes (Signed)
Pt refusing to keep cardiac monitor on. Blinds opened on door to monitor pt.

## 2020-10-24 NOTE — ED Notes (Signed)
Response from MD Coe, no new orders at this time. Pt again reminded Oxy available at 6 hours from previous dose. Pt standing in door threatening this RN stating "you got the wrong one". [employer] at this nurses station to assist

## 2020-10-24 NOTE — ED Notes (Signed)
Pt given ice chips per RN [place]

## 2020-10-25 ENCOUNTER — Observation Stay (HOSPITAL_COMMUNITY): Payer: 59 | Admitting: Anesthesiology

## 2020-10-25 ENCOUNTER — Encounter (HOSPITAL_COMMUNITY)
Admission: EM | Disposition: A | Discharge status: Home / Self Care | Payer: Self-pay | Source: Home / Self Care | Attending: Internal Medicine

## 2020-10-25 ENCOUNTER — Encounter (HOSPITAL_COMMUNITY): Payer: Self-pay | Admitting: Internal Medicine

## 2020-10-25 DIAGNOSIS — R0789 Other chest pain: Secondary | ICD-10-CM | POA: Diagnosis present

## 2020-10-25 DIAGNOSIS — Z91013 Allergy to seafood: Secondary | ICD-10-CM | POA: Diagnosis not present

## 2020-10-25 DIAGNOSIS — Z91040 Latex allergy status: Secondary | ICD-10-CM | POA: Diagnosis not present

## 2020-10-25 DIAGNOSIS — K859 Acute pancreatitis without necrosis or infection, unspecified: Secondary | ICD-10-CM

## 2020-10-25 DIAGNOSIS — D500 Iron deficiency anemia secondary to blood loss (chronic): Secondary | ICD-10-CM | POA: Diagnosis present

## 2020-10-25 DIAGNOSIS — F319 Bipolar disorder, unspecified: Secondary | ICD-10-CM | POA: Diagnosis present

## 2020-10-25 DIAGNOSIS — J45909 Unspecified asthma, uncomplicated: Secondary | ICD-10-CM | POA: Diagnosis present

## 2020-10-25 DIAGNOSIS — K860 Alcohol-induced chronic pancreatitis: Secondary | ICD-10-CM | POA: Diagnosis present

## 2020-10-25 DIAGNOSIS — K626 Ulcer of anus and rectum: Secondary | ICD-10-CM | POA: Diagnosis present

## 2020-10-25 DIAGNOSIS — R109 Unspecified abdominal pain: Secondary | ICD-10-CM | POA: Diagnosis present

## 2020-10-25 DIAGNOSIS — Z888 Allergy status to other drugs, medicaments and biological substances status: Secondary | ICD-10-CM | POA: Diagnosis not present

## 2020-10-25 DIAGNOSIS — E559 Vitamin D deficiency, unspecified: Secondary | ICD-10-CM | POA: Diagnosis present

## 2020-10-25 DIAGNOSIS — K863 Pseudocyst of pancreas: Secondary | ICD-10-CM | POA: Diagnosis present

## 2020-10-25 DIAGNOSIS — K861 Other chronic pancreatitis: Secondary | ICD-10-CM | POA: Diagnosis not present

## 2020-10-25 DIAGNOSIS — Z7951 Long term (current) use of inhaled steroids: Secondary | ICD-10-CM | POA: Diagnosis not present

## 2020-10-25 DIAGNOSIS — Y900 Blood alcohol level of less than 20 mg/100 ml: Secondary | ICD-10-CM | POA: Diagnosis not present

## 2020-10-25 DIAGNOSIS — K648 Other hemorrhoids: Secondary | ICD-10-CM | POA: Diagnosis not present

## 2020-10-25 DIAGNOSIS — R079 Chest pain, unspecified: Secondary | ICD-10-CM | POA: Diagnosis not present

## 2020-10-25 DIAGNOSIS — K862 Cyst of pancreas: Secondary | ICD-10-CM | POA: Diagnosis not present

## 2020-10-25 DIAGNOSIS — F17210 Nicotine dependence, cigarettes, uncomplicated: Secondary | ICD-10-CM | POA: Diagnosis present

## 2020-10-25 DIAGNOSIS — D649 Anemia, unspecified: Secondary | ICD-10-CM | POA: Diagnosis not present

## 2020-10-25 DIAGNOSIS — K921 Melena: Secondary | ICD-10-CM | POA: Diagnosis not present

## 2020-10-25 DIAGNOSIS — F10239 Alcohol dependence with withdrawal, unspecified: Secondary | ICD-10-CM | POA: Diagnosis present

## 2020-10-25 DIAGNOSIS — K2971 Gastritis, unspecified, with bleeding: Secondary | ICD-10-CM | POA: Diagnosis present

## 2020-10-25 DIAGNOSIS — D539 Nutritional anemia, unspecified: Secondary | ICD-10-CM | POA: Diagnosis present

## 2020-10-25 DIAGNOSIS — E7800 Pure hypercholesterolemia, unspecified: Secondary | ICD-10-CM | POA: Diagnosis present

## 2020-10-25 DIAGNOSIS — F1120 Opioid dependence, uncomplicated: Secondary | ICD-10-CM | POA: Diagnosis present

## 2020-10-25 DIAGNOSIS — D571 Sickle-cell disease without crisis: Secondary | ICD-10-CM | POA: Diagnosis present

## 2020-10-25 DIAGNOSIS — Z91041 Radiographic dye allergy status: Secondary | ICD-10-CM | POA: Diagnosis not present

## 2020-10-25 DIAGNOSIS — I10 Essential (primary) hypertension: Secondary | ICD-10-CM | POA: Diagnosis present

## 2020-10-25 DIAGNOSIS — K625 Hemorrhage of anus and rectum: Secondary | ICD-10-CM | POA: Diagnosis not present

## 2020-10-25 DIAGNOSIS — F17290 Nicotine dependence, other tobacco product, uncomplicated: Secondary | ICD-10-CM | POA: Diagnosis present

## 2020-10-25 DIAGNOSIS — Z20822 Contact with and (suspected) exposure to covid-19: Secondary | ICD-10-CM | POA: Diagnosis present

## 2020-10-25 DIAGNOSIS — K2970 Gastritis, unspecified, without bleeding: Secondary | ICD-10-CM | POA: Diagnosis not present

## 2020-10-25 DIAGNOSIS — Z79899 Other long term (current) drug therapy: Secondary | ICD-10-CM | POA: Diagnosis not present

## 2020-10-25 HISTORY — PX: FLEXIBLE SIGMOIDOSCOPY: SHX5431

## 2020-10-25 HISTORY — PX: ESOPHAGOGASTRODUODENOSCOPY (EGD) WITH PROPOFOL: SHX5813

## 2020-10-25 HISTORY — PX: HEMOSTASIS CLIP PLACEMENT: SHX6857

## 2020-10-25 LAB — CBC
HCT: 27.7 % — ABNORMAL LOW (ref 39.0–52.0)
Hemoglobin: 8.8 g/dL — ABNORMAL LOW (ref 13.0–17.0)
MCH: 32 pg (ref 26.0–34.0)
MCHC: 31.8 g/dL (ref 30.0–36.0)
MCV: 100.7 fL — ABNORMAL HIGH (ref 80.0–100.0)
Platelets: 180 10*3/uL (ref 150–400)
RBC: 2.75 MIL/uL — ABNORMAL LOW (ref 4.22–5.81)
RDW: 15.1 % (ref 11.5–15.5)
WBC: 7.7 10*3/uL (ref 4.0–10.5)
nRBC: 0 % (ref 0.0–0.2)

## 2020-10-25 LAB — VITAMIN B12: Vitamin B-12: 403 pg/mL (ref 180–914)

## 2020-10-25 LAB — COMPREHENSIVE METABOLIC PANEL
ALT: 39 U/L (ref 0–44)
AST: 42 U/L — ABNORMAL HIGH (ref 15–41)
Albumin: 3 g/dL — ABNORMAL LOW (ref 3.5–5.0)
Alkaline Phosphatase: 66 U/L (ref 38–126)
Anion gap: 10 (ref 5–15)
BUN: 7 mg/dL (ref 6–20)
CO2: 24 mmol/L (ref 22–32)
Calcium: 8.8 mg/dL — ABNORMAL LOW (ref 8.9–10.3)
Chloride: 100 mmol/L (ref 98–111)
Creatinine, Ser: 0.69 mg/dL (ref 0.61–1.24)
GFR, Estimated: >60 mL/min (ref >60)
Glucose, Bld: 95 mg/dL (ref 70–99)
Potassium: 3.9 mmol/L (ref 3.5–5.1)
Sodium: 134 mmol/L — ABNORMAL LOW (ref 135–145)
Total Bilirubin: 0.4 mg/dL (ref 0.3–1.2)
Total Protein: 6 g/dL — ABNORMAL LOW (ref 6.5–8.1)

## 2020-10-25 LAB — FOLATE: Folate: 16.4 ng/mL (ref >5.9)

## 2020-10-25 SURGERY — ESOPHAGOGASTRODUODENOSCOPY (EGD) WITH PROPOFOL
Anesthesia: Monitor Anesthesia Care

## 2020-10-25 MED ORDER — SODIUM CHLORIDE 0.9 % IV SOLN: INTRAVENOUS | Status: DC

## 2020-10-25 MED ORDER — PROPOFOL 10 MG/ML IV BOLUS
INTRAVENOUS | Status: DC | PRN
  Administered 2020-10-25: 50 mg via INTRAVENOUS

## 2020-10-25 MED ORDER — ONDANSETRON HCL 4 MG/2ML IJ SOLN
4.0000 mg | Freq: Four times a day (QID) | INTRAMUSCULAR | Status: DC | PRN
  Filled 2020-10-25: qty 2

## 2020-10-25 MED ORDER — LACTATED RINGERS IV SOLN
INTRAVENOUS | Status: AC | PRN
  Administered 2020-10-25: 20 mL/h via INTRAVENOUS

## 2020-10-25 MED ORDER — SODIUM CHLORIDE 0.9 % IV SOLN
510.0000 mg | Freq: Once | INTRAVENOUS | Status: AC
  Administered 2020-10-25: 510 mg via INTRAVENOUS
  Filled 2020-10-25: qty 17

## 2020-10-25 MED ORDER — PROPOFOL 500 MG/50ML IV EMUL
INTRAVENOUS | Status: DC | PRN
  Administered 2020-10-25: 150 ug/kg/min via INTRAVENOUS

## 2020-10-25 MED ORDER — OXYCODONE-ACETAMINOPHEN 5-325 MG PO TABS
1.0000 | ORAL_TABLET | Freq: Four times a day (QID) | ORAL | Status: DC | PRN
  Administered 2020-10-25 – 2020-10-26 (×4): 1 via ORAL
  Filled 2020-10-25 (×4): qty 1

## 2020-10-25 MED ORDER — LIDOCAINE 2% (20 MG/ML) 5 ML SYRINGE
INTRAMUSCULAR | Status: DC | PRN
  Administered 2020-10-25: 100 mg via INTRAVENOUS

## 2020-10-25 SURGICAL SUPPLY — 24 items
BLOCK BITE 60FR ADLT L/F BLUE (MISCELLANEOUS) ×3 IMPLANT
ELECT REM PT RETURN 9FT ADLT (ELECTROSURGICAL)
ELECTRODE REM PT RTRN 9FT ADLT (ELECTROSURGICAL) IMPLANT
FCP BXJMBJMB 240X2.8X (CUTTING FORCEPS)
FLOOR PAD 36X40 (MISCELLANEOUS) ×3
FORCEP RJ3 GP 1.8X160 W-NEEDLE (CUTTING FORCEPS) IMPLANT
FORCEPS BIOP RAD 4 LRG CAP 4 (CUTTING FORCEPS) IMPLANT
FORCEPS BIOP RJ4 240 W/NDL (CUTTING FORCEPS)
FORCEPS BXJMBJMB 240X2.8X (CUTTING FORCEPS) IMPLANT
INJECTOR/SNARE I SNARE (MISCELLANEOUS) IMPLANT
LUBRICANT JELLY 4.5OZ STERILE (MISCELLANEOUS) IMPLANT
MANIFOLD NEPTUNE II (INSTRUMENTS) IMPLANT
NEEDLE SCLEROTHERAPY 25GX240 (NEEDLE) IMPLANT
PAD FLOOR 36X40 (MISCELLANEOUS) ×2 IMPLANT
PROBE APC STR FIRE (PROBE) IMPLANT
PROBE INJECTION GOLD (MISCELLANEOUS)
PROBE INJECTION GOLD 7FR (MISCELLANEOUS) IMPLANT
SNARE ROTATE MED OVAL 20MM (MISCELLANEOUS) IMPLANT
SNARE SHORT THROW 13M SML OVAL (MISCELLANEOUS) IMPLANT
SYR 50ML LL SCALE MARK (SYRINGE) IMPLANT
TRAP SPECIMEN MUCOUS 40CC (MISCELLANEOUS) IMPLANT
TUBING ENDO SMARTCAP PENTAX (MISCELLANEOUS) ×6 IMPLANT
TUBING IRRIGATION ENDOGATOR (MISCELLANEOUS) ×3 IMPLANT
WATER STERILE IRR 1000ML POUR (IV SOLUTION) IMPLANT

## 2020-10-25 NOTE — Plan of Care (Signed)
  Problem: Clinical Measurements: Goal: Diagnostic test results will improve Outcome: Progressing   

## 2020-10-25 NOTE — Progress Notes (Signed)
First dose of Tap water enema given to patient rectally.

## 2020-10-25 NOTE — Progress Notes (Signed)
8:30 PM RN reported that patient has severe upper abdominal pain and requesting IV pain medication.  When evaluated bedside, patient is sleeping comfortably.  No signs of acute distress.  He is walking out of the room without difficulty.  Patient states that he has upper abdominal pain and vomited 1 time after the procedure.  Patient states that he has taking oxycodone in the past at home which did not help.  He requested IV Dilaudid.  Physical exam is benign.  No pain to palpation of the abdomen.  Normal bowel sound.   I explained to patient that we will try to control his pain with p.o. Percocet.  No indication for IV Dilaudid at this time.  Explained to him the risk of IV pain medication.  Will add Zofran for nausea.

## 2020-10-25 NOTE — Progress Notes (Signed)
   Subjective:   Patient was seen after his EGD, we reviewed the results and recommendations.   Objective:  Physical Exam Constitutional: no acute distress , appears tired after his EGD Cardiovascular: regular rate and rhythm, normal heart sounds Pulmonary: effort normal, Mild wheezing on auscultation bilaterally.  Abdomen: soft , NT, active bowel sounds Skin: warm and dry  Assessment/Plan: [name] is a 50 y.o. male with hx of hypertension, WPW pattern, alcohol abuse disorder, alcohol induced recurrent pancreatitis, sickle cell trait, bipolar disorder, MDD, presented with epigastric pain , bright red blood mixed with stool, and reported jerking movements with associated hypocalcemia.    Active Problems:   Nonspecific chest pain   Hypocalcemia   Hypomagnesemia  Bright red blood per rectum -Patient underwent upper endoscopy and flexible sigmoidoscopy today.  Review of sigmoidoscopy results show grade 2 internal hemorrhoids and a single solitary ulcer in distal rectum.  Suggested sterile coral ulcer or potential ulcer from recent enemas.  With large stool burden.  No new lesions found in endoscopy compared to previous endoscopy, see dr. Donneta Romberg report for details.  -Hemoglobin stable at 8.8, from 8.9 yesterday.  Macrocytic anemia, B12 and folate are in acceptable range .Iron studies show expected iron deficiency anemia.  - GI consulting , appreciate there recommendation and interventions -IV Feraheme 1 dose, can continue oral supplementation discharge, suggest follow-up at discharge GI Recommendations: High-fiber diet, use FiberCon 1 to 2 tablets p.o. daily, Anusol suppositories nightly x1 week and then every other night for 2 weeks can be considered, daily sitz bath as needed, Preparation H cream twice daily as needed.  PPI daily and trial of Carafate  Hypocalcemia  Patient was admitted with 1 calcium of 6.9 , ionized calcium returned normal at 5.2.  Received 1 dose of  calcium gluconate in ED. Calcium has remained normal, calcium corrects to 9.6 today.  Vitamin D 25. PTH is still pending. -Monitor CMP in the morning. -Discontinue cardiac monitoring -Follow-up PTH levels -Follow-up Vitamin D levels.    Chronic pancreatitis Abdominal pain Complains of nausea and epigastric pain this morning.  Diffuse tenderness on abdominal exam.  Patient has been getting his GI cocktail.  Patient has a history of chronic recurrent alcoholic pancreatitis. Lipase mildly elevated at 55.   CT abdomen pelvis shows Sequela from chronic calcific pancreatitis, with continued cystic dilatation of the pancreatic duct and enlarging pancreatic pseudocysts as above. No acute inflammatory changes. Chronic gastric wall thickening which may reflect sequela from gastritis. -Start liquid diet. -Oxycodone 5-325 mg tablet 4 times daily as needed -Continue lipase 72,000 Units Orally 3 Times Daily with Meals -Dilaudid every 0.5 to 1 mg 2 hour as needed -Monitor vitals  Alcohol use He dinks 6 packs of beer every day. -Continue CIWA with Ativan. -Continue thiamine, Multivitamin, folic acid. -Seizure precautions.   Dispo: Anticipated discharge in approximately 1 day(s).   [name], MD 10/25/2020, 7:42 AM Pager: [phone] After 5pm on weekdays and 1pm on weekends: Please call On Call pager [phone]

## 2020-10-25 NOTE — Progress Notes (Signed)
NEW ADMISSION NOTE New Admission Note:   Arrival Method: E.D stretcher bed Mental Orientation: Alert and oriented x4 Telemetry:#8 NSR Assessment: Completed Skin: Intact ,assessed with Ulice Dash R.N. IV: Lt. Wrist NSL Pain:  Denies at this time. Tubes: none Safety Measures: Safety Fall Prevention Plan has been given, discussed and signed Admission: Completed [facility] Orientation: Patient has been orientated to the room, unit and staff.  Family:  Orders have been reviewed and implemented. Will continue to monitor the patient. Call light has been placed within reach and bed alarm has been activated.   [place], Zenon Mayo, RN

## 2020-10-25 NOTE — Plan of Care (Signed)
  Problem: Clinical Measurements: Goal: Ability to maintain clinical measurements within normal limits will improve Outcome: Progressing   Problem: Coping: Goal: Level of anxiety will decrease Outcome: Progressing   

## 2020-10-25 NOTE — Progress Notes (Signed)
Second dose of Tap water enema given to patient.

## 2020-10-25 NOTE — Progress Notes (Signed)
Smell of cigarette smokes permeated in patient's room.Reminded patient that smoking is not allowed on the entire hospital campus especially in the patient's room .CN made aware  and paged the security.

## 2020-10-25 NOTE — Anesthesia Postprocedure Evaluation (Signed)
Anesthesia Post Note  Patient: [name]  Procedure(s) Performed: ESOPHAGOGASTRODUODENOSCOPY (EGD) WITH PROPOFOL [place]     Patient location during evaluation: PACU Anesthesia Type: MAC Level of consciousness: awake and alert Pain management: pain level controlled Vital Signs Assessment: post-procedure vital signs reviewed and stable Respiratory status: spontaneous breathing, nonlabored ventilation and respiratory function stable Cardiovascular status: stable and blood pressure returned to baseline Anesthetic complications: no   No notable events documented.  Last Vitals:  Vitals:   10/25/20 1200 10/25/20 1225  BP: 109/83 (!) 129/99  Pulse: 77 78  Resp: 14 16  Temp: 36.5 C   SpO2: 99% 91%    Last Pain:  Vitals:   10/25/20 1200  TempSrc:   PainSc: 0-No pain                 Audry Pili

## 2020-10-25 NOTE — Op Note (Signed)
[facility] Patient Name: [name] Procedure Date : 10/25/2020 MRN: [id number] Attending MD:  Mansouraty , MD Date of Birth: 03/10/1971 CSN: [id number] Age: 50 Admit Type: Inpatient Procedure:                Flexible Sigmoidoscopy Indications:              Hematochezia Providers:                 Mansouraty, MD, Karen Hinson, RN, Janie                            [place], Technician,Kelly Tomkins CRNA Referring MD:             Steven P. [name], MD, Hospital Team Medicines:                Monitored Anesthesia Care Complications:            No immediate complications. Estimated Blood Loss:     Estimated blood loss was minimal. Procedure:                Pre-Anesthesia Assessment:                           - Prior to the procedure, a History and Physical                            was performed, and patient medications and                            allergies were reviewed. The patient's tolerance of                            previous anesthesia was also reviewed. The risks                            and benefits of the procedure and the sedation                            options and risks were discussed with the patient.                            All questions were answered, and informed consent                            was obtained. Prior Anticoagulants: The patient has                            taken no previous anticoagulant or antiplatelet                            agents except for NSAID medication. ASA Grade                            Assessment: III - A patient with severe systemic                              disease. After reviewing the risks and benefits,                            the patient was deemed in satisfactory condition to                            undergo the procedure.                           After obtaining informed consent, the scope was                            passed under direct vision. The GIF-[id number] ([id number])                             Olympus gastroscope was introduced through the anus                            and advanced to the the splenic flexure. The                            flexible sigmoidoscopy was accomplished without                            difficulty. The patient tolerated the procedure.                            The quality of the bowel preparation was fair. Scope In: 11:16:07 AM Scope Out: 11:22:10 AM Total Procedure Duration: 0 hours 6 minutes 3 seconds  Findings:      The digital rectal exam findings include hemorrhoids. Pertinent       negatives include no palpable rectal lesions.      A large amount of semi-liquid semi-solid stool was found in the entire       colon, interfering with visualization. Lavage of the area was performed       using copious amounts, resulting in clearance with fair visualization.      A single (solitary) ten mm ulcer was found in the distal rectum. Oozing       was present. It is not clear to me that this is truly a stercoral ulcer       vs potential ulcer from recent enemas for today's procedure. I favor       this to be from the enemas, but it needed to be treated. For hemostasis,       three hemostatic clips were successfully placed (MR conditional). There       was no bleeding at the end of the procedure.      Non-bleeding non-thrombosed internal hemorrhoids were found during       retroflexion, during perianal exam and during digital exam. The       hemorrhoids were Grade II (internal hemorrhoids that prolapse but reduce       spontaneously). Impression:               - Preparation of the colon was fair.                           -   Hemorrhoids found on digital rectal exam.                           - Stool in the entire examined colon.                           - A single (solitary) ulcer in the distal rectum.                            Clips (MR conditional) were placed.                           - Non-bleeding non-thrombosed internal  hemorrhoids. Recommendation:           - The patient will be observed post-procedure,                            until all discharge criteria are met.                           - Return patient to hospital ward for ongoing care.                           - High fiber diet.                           - Use FiberCon 1-2 tablets PO daily.                           - Anusol suppositories nightly x 1-week and then                            every other night for 2-weeks can be considered.                           - Take Sitz baths Daily as needed.                           - Preparation H cream: Apply externally once to BID                            PRN.                           - There was significant amount of brown stool in                            the proximal colon that impeded further passage of                            the endoscope. With this being said, nothing that                            was found would suggest a reason for marroon stool                              other than the rectal ulcer. It doesn't have the                            typical appearance of a stercoral ulcer but it                            could be a source of possible bleeding. Hemorrhoids                            could be etiology of this as well. With the amount                            of brown stool that he has at this point, no                            evidence of ongoing/active bleeding is present. If                            patient has IDA in future, would consider a VCE as                            outpatient.                           - No further workup from anemia perspective at this                            time from an inpatient perspective.                           - The findings and recommendations were discussed                            with the patient.                           - The findings and recommendations were discussed                            with the  referring physician. Procedure Code(s):        --- Professional ---                           45334, Sigmoidoscopy, flexible; with control of                            bleeding, any method Diagnosis Code(s):        --- Professional ---                           K64.1, Second degree hemorrhoids                           K62.6, Ulcer of anus and rectum                             K92.1, Melena (includes Hematochezia) CPT copyright 2019 [employer]. All rights reserved. The codes documented in this report are preliminary and upon coder review may  be revised to meet current compliance requirements.  Mansouraty, MD 10/25/2020 11:58:17 AM Number of Addenda: 0 

## 2020-10-25 NOTE — Op Note (Signed)
[facility] Patient Name: [name] Procedure Date : 10/25/2020 MRN: [id number] Attending MD: Justice Britain , MD Date of Birth: June 10, 1970 CSN: [id number] Age: 50 Admit Type: Inpatient Procedure:                Upper GI endoscopy Indications:              Generalized abdominal pain, , Hematochezia,                            Exclusion of duodenal ulcer, Abnormal CT of the GI                            tract, PancreatitisHistory of Chronic Providers:                Justice Britain, MD, Elmer Ramp. Tilden Dome, RN, Janie                            Billups,, Lance Coon, CRNA Referring MD:             [name]. Havery Moros, MD, Hospital Team Medicines:                Monitored Anesthesia Care Complications:            No immediate complications. Estimated Blood Loss:     Estimated blood loss: none. Procedure:                Pre-Anesthesia Assessment:                           - Prior to the procedure, a History and Physical                            was performed, and patient medications and                            allergies were reviewed. The patient's tolerance of                            previous anesthesia was also reviewed. The risks                            and benefits of the procedure and the sedation                            options and risks were discussed with the patient.                            All questions were answered, and informed consent                            was obtained. Prior Anticoagulants: The patient has                            taken no previous anticoagulant or antiplatelet  agents except for NSAID medication. ASA Grade                            Assessment: III - A patient with severe systemic                            disease. After reviewing the risks and benefits,                            the patient was deemed in satisfactory condition to                            undergo the  procedure.                           After obtaining informed consent, the endoscope was                            passed under direct vision. Throughout the                            procedure, the patient's blood pressure, pulse, and                            oxygen saturations were monitored continuously. The                            GIF-H190 ([id number]) Olympus gastroscope was                            introduced through the mouth, and advanced to the                            second part of duodenum. The upper GI endoscopy was                            accomplished without difficulty. The patient                            tolerated the procedure. Scope In: Scope Out: Findings:      No gross lesions were noted in the entire esophagus.      The Z-line was regular and was found 43 cm from the incisors.      A 2 cm hiatal hernia was present.      Patchy mildly erythematous mucosa without bleeding was found in the       gastric body and in the gastric antrum - previously biopsied within the       last 10 months so not rebiopsied.      No other gross lesions were noted in the entire examined stomach.      No gross lesions were noted in the duodenal bulb, in the first portion       of the duodenum and in the second portion of the duodenum. Impression:               -  No gross lesions in esophagus. Z-line regular, 43                            cm from the incisors.                           - 2 cm hiatal hernia noted.                           - Erythematous mucosa in the gastric body and                            antrum. No other gross lesions in the stomach.                            Previously biopsied within last 10 months and                            negative for HP.                           - No gross lesions in the duodenal bulb, in the                            first portion of the duodenum and in the second                            portion of the  duodenum. Recommendation:           - Proceed to scheduled Flexible Sigmoidoscopy.                           - Observe patient's clinical course.                           - Continue PPI daily.                           - Carafate BID can be trialed.                           - The findings and recommendations were discussed                            with the patient.                           - The findings and recommendations were discussed                            with the referring physician. Procedure Code(s):        --- Professional ---                           [phone], Esophagogastroduodenoscopy, flexible,  transoral; diagnostic, including collection of                            specimen(s) by brushing or washing, when performed                            (separate procedure) Diagnosis Code(s):        --- Professional ---                           K31.89, Other diseases of stomach and duodenum                           R10.84, Generalized abdominal pain                           K92.1, Melena (includes Hematochezia)                           K85.90, Acute pancreatitis without necrosis or                            infection, unspecified                           R93.3, Abnormal findings on diagnostic imaging of                            other parts of digestive tract CPT copyright 2019 American Medical Association. All rights reserved. The codes documented in this report are preliminary and upon coder review may  be revised to meet current compliance requirements. Justice Britain, MD 10/25/2020 11:50:56 AM Number of Addenda: 0

## 2020-10-25 NOTE — Anesthesia Preprocedure Evaluation (Addendum)
Anesthesia Evaluation  Patient identified by MRN, date of birth, ID bandGeneral Assessment Comment:Drowsy, but arousable   Reviewed: Allergy & Precautions, NPO status , Patient's Chart, lab work & pertinent test results, reviewed documented beta blocker date and time   History of Anesthesia Complications Negative for: history of anesthetic complications  Airway Mallampati: II  TM Distance: >3 FB Neck ROM: Full    Dental  (+) Dental Advisory Given, Poor Dentition, Missing   Pulmonary asthma , Current Smoker and Patient abstained from smoking.,    Pulmonary exam normal        Cardiovascular hypertension, Pt. on home beta blockers and Pt. on medications Normal cardiovascular exam+ dysrhythmias (WPW)    '21 TTE - EF 60-65%, G1DD. No valvulopathy    Neuro/Psych  Headaches, Seizures -,  PSYCHIATRIC DISORDERS Anxiety Depression Bipolar Disorder  Hx suicide attempt    GI/Hepatic hiatal hernia, GERD  Medicated and Controlled,(+)     substance abuse  alcohol use, cocaine use and marijuana use,   Endo/Other  negative endocrine ROS  Renal/GU negative Renal ROS     Musculoskeletal  (+) Arthritis ,   Abdominal   Peds  Hematology  (+) Blood dyscrasia, Sickle cell trait and anemia ,   Anesthesia Other Findings Covid test negative   Reproductive/Obstetrics                           Anesthesia Physical Anesthesia Plan  ASA: 3  Anesthesia Plan: MAC   Post-op Pain Management:    Induction: Intravenous  PONV Risk Score and Plan: 0 and Propofol infusion and Treatment may vary due to age or medical condition  Airway Management Planned: Nasal Cannula and Natural Airway  Additional Equipment: None  Intra-op Plan:   Post-operative Plan:   Informed Consent: I have reviewed the patients History and Physical, chart, labs and discussed the procedure including the risks, benefits and alternatives for the  proposed anesthesia with the patient or authorized representative who has indicated his/her understanding and acceptance.       Plan Discussed with: CRNA and Anesthesiologist  Anesthesia Plan Comments:        Anesthesia Quick Evaluation

## 2020-10-25 NOTE — Progress Notes (Signed)
Patient went down for his endo.

## 2020-10-25 NOTE — Progress Notes (Signed)
For the second time,smell of cigarette smokes permeated on the hallway coming from the patient's room, patient did not listened from the staffs that smoking is not allowed in the entire hospital campus especially in patient's room.Paged security for the second time-found was a pack of cigarette and a lighter from patient's belonging as searched by security persons.

## 2020-10-25 NOTE — Transfer of Care (Signed)
Immediate Anesthesia Transfer of Care Note  Patient: [name]  Procedure(s) Performed: ESOPHAGOGASTRODUODENOSCOPY (EGD) WITH PROPOFOL FLEXIBLE SIGMOIDOSCOPY HEMOSTASIS CLIP PLACEMENT  Patient Location: PACU  Anesthesia Type:General  Level of Consciousness: drowsy and patient cooperative  Airway & Oxygen Therapy: Patient Spontanous Breathing  Post-op Assessment: Report given to RN and Post -op Vital signs reviewed and stable  Post vital signs: Reviewed and stable  Last Vitals:  Vitals Value Taken Time  BP 172/108 10/25/20 1127  Temp    Pulse 90 10/25/20 1128  Resp 12 10/25/20 1128  SpO2 96 % 10/25/20 1128  Vitals shown include unvalidated device data.  Last Pain:  Vitals:   10/25/20 1037  TempSrc: Oral  PainSc: 6       Patients Stated Pain Goal: 0 (47/82/95 6213)  Complications: No notable events documented.

## 2020-10-26 ENCOUNTER — Encounter (HOSPITAL_COMMUNITY): Payer: Self-pay | Admitting: Gastroenterology

## 2020-10-26 DIAGNOSIS — K860 Alcohol-induced chronic pancreatitis: Principal | ICD-10-CM

## 2020-10-26 DIAGNOSIS — K626 Ulcer of anus and rectum: Secondary | ICD-10-CM

## 2020-10-26 LAB — COMPREHENSIVE METABOLIC PANEL
ALT: 37 U/L (ref 0–44)
AST: 32 U/L (ref 15–41)
Albumin: 3.4 g/dL — ABNORMAL LOW (ref 3.5–5.0)
Alkaline Phosphatase: 77 U/L (ref 38–126)
Anion gap: 8 (ref 5–15)
BUN: <5 mg/dL — ABNORMAL LOW (ref 6–20)
CO2: 28 mmol/L (ref 22–32)
Calcium: 9.2 mg/dL (ref 8.9–10.3)
Chloride: 103 mmol/L (ref 98–111)
Creatinine, Ser: 0.65 mg/dL (ref 0.61–1.24)
GFR, Estimated: >60 mL/min (ref >60)
Glucose, Bld: 122 mg/dL — ABNORMAL HIGH (ref 70–99)
Potassium: 3.9 mmol/L (ref 3.5–5.1)
Sodium: 139 mmol/L (ref 135–145)
Total Bilirubin: 1.1 mg/dL (ref 0.3–1.2)
Total Protein: 6.9 g/dL (ref 6.5–8.1)

## 2020-10-26 LAB — CBC
HCT: 30.1 % — ABNORMAL LOW (ref 39.0–52.0)
Hemoglobin: 10.1 g/dL — ABNORMAL LOW (ref 13.0–17.0)
MCH: 33.1 pg (ref 26.0–34.0)
MCHC: 33.6 g/dL (ref 30.0–36.0)
MCV: 98.7 fL (ref 80.0–100.0)
Platelets: 172 10*3/uL (ref 150–400)
RBC: 3.05 MIL/uL — ABNORMAL LOW (ref 4.22–5.81)
RDW: 15.1 % (ref 11.5–15.5)
WBC: 7.8 10*3/uL (ref 4.0–10.5)
nRBC: 0 % (ref 0.0–0.2)

## 2020-10-26 LAB — PARATHYROID HORMONE, INTACT (NO CA): PTH: 29 pg/mL (ref 15–65)

## 2020-10-26 LAB — MAGNESIUM: Magnesium: 1.6 mg/dL — ABNORMAL LOW (ref 1.7–2.4)

## 2020-10-26 MED ORDER — MAGNESIUM SULFATE 2 GM/50ML IV SOLN
2.0000 g | Freq: Once | INTRAVENOUS | Status: AC
Start: 2020-10-26 — End: 2020-10-26
  Administered 2020-10-26: 2 g via INTRAVENOUS
  Filled 2020-10-26: qty 50

## 2020-10-26 MED ORDER — OXYCODONE HCL 5 MG PO TABS
7.5000 mg | ORAL_TABLET | Freq: Four times a day (QID) | ORAL | Status: DC | PRN
  Administered 2020-10-26 – 2020-10-27 (×2): 7.5 mg via ORAL
  Filled 2020-10-26 (×2): qty 2

## 2020-10-26 NOTE — Progress Notes (Addendum)
Progress Note   Subjective  Chief Complaint: Anemia, rectal bleeding, nausea and vomiting  Status post flex sig 10/25/2020 with rectal ulcer; EGD with erythematous mucosa in the gastric body and antrum and otherwise normal     Today, the patient tells me that he has not had a bowel movement since time of procedure yesterday, he has continued with nausea and vomiting as well as some epigastric pain.  He tells me they just gave him some Oxycodone and it has not really worked at all.  He has not been able to eat much today and is trying to just stay with liquids.  He wonders if he can have stronger pain medicine.  Tells me the pain is worsened overnight.  Now an 8-9/10.    Objective   Vital signs in last 24 hours: Temp:  [97.4 F (36.3 C)-98 F (36.7 C)] 98 F (36.7 C) (06/20 1001) Pulse Rate:  [78-107] 92 (06/20 1134) Resp:  [16-19] 18 (06/20 1001) BP: (121-149)/(80-101) 134/92 (06/20 1134) SpO2:  [91 %-100 %] 98 % (06/20 1001) Weight:  [52.7 kg-55.3 kg] 52.7 kg (06/20 0843) Last BM Date: 10/25/20 General:    AA male in mild distress Heart:  Regular rate and rhythm; no murmurs Lungs: Respirations even and unlabored, lungs CTA bilaterally Abdomen:  Soft, moderate epigastric TTP and nondistended. Normal bowel sounds. Extremities:  Without edema. Neurologic:  Alert and oriented,  grossly normal neurologically. Psych:  Cooperative. Normal mood and affect.  Intake/Output from previous day: 06/19 0701 - 06/20 0700 In: 520 [P.O.:220; I.V.:300] Out: 200 [Urine:200]  Lab Results: Recent Labs    10/24/20 0226 10/25/20 0256 10/26/20 0659  WBC 9.6 7.7 7.8  HGB 8.9* 8.8* 10.1*  HCT 28.6* 27.7* 30.1*  PLT 208 180 172   BMET Recent Labs    10/24/20 0226 10/25/20 0256 10/26/20 0659  NA 136 134* 139  K 3.9 3.9 3.9  CL 106 100 103  CO2 21* 24 28  GLUCOSE 128* 95 122*  BUN 12 7 <5*  CREATININE 0.76 0.69 0.65  CALCIUM 9.1 8.8* 9.2   LFT Recent Labs    10/26/20 0659   PROT 6.9  ALBUMIN 3.4*  AST 32  ALT 37  ALKPHOS 77  BILITOT 1.1     Assessment / Plan:   Assessment: 1.  Nausea/vomiting with upper abdominal pain: EGD with a 2 cm hiatal hernia, erythematous mucosa in the gastric body and antrum and otherwise normal; likely symptoms from chronic pancreatitis 2.  Rectal bleeding with a history of internal hemorrhoids: No further bleeding per patient, finding of rectal ulcer yesterday and flex sig, hemoglobin stable 3.  Acute on chronic anemia 4.  History of acute on chronic pancreatitis secondary to alcohol use disorder: CTAP without contrast showed sequela from chronic calcific pancreatitis with continued cystic dilation of the pancreatic duct enlarging pancreatic pseudocyst 5.  Polysubstance abuse  Plan: 1.  Patient will need to go back to n.p.o. for today and will need to advance diet as tolerated over the next few days 2.  Agree with antiemetics and analgesics as needed 3.  Discussed finding of rectal ulcer with the patient, he has had no further signs of rectal bleeding and his hemoglobin is stable 4.  Restart IV fluids for pancreatitis 5.  Please await further recommendation of Dr. Carlean Purl later today  Thank you for your kind consultation.  We will continue to follow.    LOS: 1 day   Levin Erp  10/26/2020,  12:17 PM  GI ATT  No bleeding but increased abd pain/pancreatits   I changed to 7.5 mg oxycodone for now  Complicated w/ EtOH w/d concerns  Will follow  Gatha Mayer, MD, [facility] 10/26/2020 6:04 PM

## 2020-10-26 NOTE — Progress Notes (Addendum)
Subjective:  HD 1  Overnight, on call team paged for severe upper abdominal pain and patient requesting IV pain medication. Physical exam benign and patient recommended to continue with oral pain management.   [name] was evaluated at bedside this morning. He is resting comfortably in bed. He reports vomiting right after eating his breakfast this morning. States his pain meds came up with the food. He endorse some abd pain well. Requesting increase in his pain regimen. He reports he was taking Carafate and protonix at home for reflux symptoms at home.   Objective: Vital signs in last 24 hours: Vitals:   10/26/20 0757 10/26/20 0843 10/26/20 1001 10/26/20 1134  BP:   121/89 (!) 134/92  Pulse:   89 92  Resp:   18   Temp:   98 F (36.7 C)   TempSrc:      SpO2: 97%  98%   Weight:  52.7 kg    Height:       CBC Latest Ref Rng & Units 10/26/2020 10/25/2020 10/24/2020  WBC 4.0 - 10.5 K/uL 7.8 7.7 9.6  Hemoglobin 13.0 - 17.0 g/dL 10.1(L) 8.8(L) 8.9(L)  Hematocrit 39.0 - 52.0 % 30.1(L) 27.7(L) 28.6(L)  Platelets 150 - 400 K/uL 172 180 208   BMP Latest Ref Rng & Units 10/26/2020 10/25/2020 10/24/2020  Glucose 70 - 99 mg/dL 122(H) 95 128(H)  BUN 6 - 20 mg/dL <5(L) 7 12  Creatinine 0.61 - 1.24 mg/dL 0.65 0.69 0.76  BUN/Creat Ratio 9 - 20 - - -  Sodium 135 - 145 mmol/L 139 134(L) 136  Potassium 3.5 - 5.1 mmol/L 3.9 3.9 3.9  Chloride 98 - 111 mmol/L 103 100 106  CO2 22 - 32 mmol/L 28 24 21(L)  Calcium 8.9 - 10.3 mg/dL 9.2 8.8(L) 9.1   Physical Exam  Gen: Well-developed, well nourished, NAD HEENT: NCAT head, hearing intact, EOMI, MMM Pulm: Breathing comfortably on room air, no cough, no distress  Abd: Soft, BS+, NTND Extm: ROM intact, No peripheral edema Skin: Dry, Warm, normal turgor Neuro: AAOx3, Answers questions appropriately Psych: Normal mood and affect   Assessment/Plan:  Active Problems:   Nonspecific chest pain   Hypocalcemia   Hypomagnesemia  [name] is a 50  yo M w/ Pmh of Htn, WPW, EtOh abuse, recurrent pancreatitis, sickle cell trait, bipolar disorder, MDD presenting with abdominal pain admit for BRBPR  Lower GI Bleed 2/2 distal rectum ulcer Blood loss anemia Sigmoidoscopy yesterday showing bleeding ulcer treated w/ clips. EGD w/o any new findings (mucosal erythema consistent w/ prior EGD). Hgb stable at 8.8->10.1. No further GI bleeds overnight. Received IV feraheme 1 doseduring admission  - Gi on board - C/w high-fiber diet, fibercon, ansuol suppositories, sitzbath as needed - PPI 40mg  BID - Carafate 1g TID qc - Trend cbc - Transfuse as needed to keep hgb >7  Chronic Pancreatitis Endorse worsening abdominal pain overnight. On oxycodone 4 times daily for pain. Lipase 55. Having episode of emesis. Requesting additional pain meds but exam benign - C/w Creon 82 units TID qc - C/w oxycodone 5-325mg  4 times daily PRN - Can d/c if able to tolerate PO  Hypocalcemia 2/2 vit D deficiency Admit Ca 6.9. 9.4 post-repletion. Pth wnl @ 29 drawn after calcium repletion. Vit D low at  25.04. - Start vitamin D repletion 50,000unit weekly  Alcohol Use 6 pack beer daily use. CIWA score 10 this am due to nausea/vomiting - C/w CIWA w/ Ativan - C/w thiamine, folate, vitamins  Prior  to Admission Living Arrangement: Home  Anticipated Discharge Location: Home  Barriers to Discharge: Continued medical management  Dispo: Anticipated discharge in approximately 0-1 day(s).   Jason Anis, MD IMTS PGY-3 10/26/2020, 2:42 PM Pager: [phone] After 5pm on weekdays and 1pm on weekends: On Call Pager: [phone]

## 2020-10-26 NOTE — Plan of Care (Signed)
  Problem: Education: Goal: Knowledge of General Education information will improve Description: Including pain rating scale, medication(s)/side effects and non-pharmacologic comfort measures Outcome: Progressing   Problem: Coping: Goal: Level of anxiety will decrease Outcome: Not Progressing   Problem: Pain Managment: Goal: General experience of comfort will improve Outcome: Not Progressing

## 2020-10-27 LAB — COMPREHENSIVE METABOLIC PANEL
ALT: 36 U/L (ref 0–44)
AST: 36 U/L (ref 15–41)
Albumin: 3.1 g/dL — ABNORMAL LOW (ref 3.5–5.0)
Alkaline Phosphatase: 69 U/L (ref 38–126)
Anion gap: 8 (ref 5–15)
BUN: 5 mg/dL — ABNORMAL LOW (ref 6–20)
CO2: 28 mmol/L (ref 22–32)
Calcium: 9.2 mg/dL (ref 8.9–10.3)
Chloride: 102 mmol/L (ref 98–111)
Creatinine, Ser: 0.68 mg/dL (ref 0.61–1.24)
GFR, Estimated: >60 mL/min (ref >60)
Glucose, Bld: 98 mg/dL (ref 70–99)
Potassium: 3.7 mmol/L (ref 3.5–5.1)
Sodium: 138 mmol/L (ref 135–145)
Total Bilirubin: 0.8 mg/dL (ref 0.3–1.2)
Total Protein: 6.3 g/dL — ABNORMAL LOW (ref 6.5–8.1)

## 2020-10-27 LAB — CBC
HCT: 28.3 % — ABNORMAL LOW (ref 39.0–52.0)
Hemoglobin: 9 g/dL — ABNORMAL LOW (ref 13.0–17.0)
MCH: 32 pg (ref 26.0–34.0)
MCHC: 31.8 g/dL (ref 30.0–36.0)
MCV: 100.7 fL — ABNORMAL HIGH (ref 80.0–100.0)
Platelets: 160 10*3/uL (ref 150–400)
RBC: 2.81 MIL/uL — ABNORMAL LOW (ref 4.22–5.81)
RDW: 15.2 % (ref 11.5–15.5)
WBC: 6.5 10*3/uL (ref 4.0–10.5)
nRBC: 0 % (ref 0.0–0.2)

## 2020-10-27 LAB — MAGNESIUM: Magnesium: 1.8 mg/dL (ref 1.7–2.4)

## 2020-10-27 LAB — PHOSPHORUS: Phosphorus: 4.1 mg/dL (ref 2.5–4.6)

## 2020-10-27 MED ORDER — LORAZEPAM 1 MG PO TABS: 1.0000 mg | ORAL_TABLET | ORAL | Status: DC | PRN

## 2020-10-27 MED ORDER — HYDROMORPHONE HCL 1 MG/ML IJ SOLN
0.5000 mg | Freq: Four times a day (QID) | INTRAMUSCULAR | Status: DC | PRN
  Administered 2020-10-27 – 2020-10-28 (×5): 0.5 mg via INTRAVENOUS
  Filled 2020-10-27 (×5): qty 1

## 2020-10-27 MED ORDER — HYDROMORPHONE HCL 1 MG/ML IJ SOLN: 0.5000 mg | INTRAMUSCULAR | Status: DC | PRN

## 2020-10-27 MED ORDER — SENNOSIDES-DOCUSATE SODIUM 8.6-50 MG PO TABS
1.0000 | ORAL_TABLET | Freq: Every day | ORAL | Status: DC
  Administered 2020-10-27 – 2020-10-29 (×3): 1 via ORAL
  Filled 2020-10-27 (×3): qty 1

## 2020-10-27 MED ORDER — MAGNESIUM SULFATE 2 GM/50ML IV SOLN
2.0000 g | Freq: Once | INTRAVENOUS | Status: AC
  Administered 2020-10-27: 2 g via INTRAVENOUS
  Filled 2020-10-27: qty 50

## 2020-10-27 MED ORDER — ONDANSETRON HCL 4 MG/2ML IJ SOLN
4.0000 mg | Freq: Four times a day (QID) | INTRAMUSCULAR | Status: DC
  Administered 2020-10-27 – 2020-10-29 (×10): 4 mg via INTRAVENOUS
  Filled 2020-10-27 (×10): qty 2

## 2020-10-27 MED ORDER — DEXTROSE-NACL 5-0.45 % IV SOLN: INTRAVENOUS | Status: DC

## 2020-10-27 MED ORDER — VITAMIN D 25 MCG (1000 UNIT) PO TABS
1000.0000 [IU] | ORAL_TABLET | Freq: Every day | ORAL | Status: DC
  Administered 2020-10-27 – 2020-10-29 (×3): 1000 [IU] via ORAL
  Filled 2020-10-27 (×3): qty 1

## 2020-10-27 NOTE — Plan of Care (Signed)
  Problem: Education: Goal: Knowledge of General Education information will improve Description Including pain rating scale, medication(s)/side effects and non-pharmacologic comfort measures Outcome: Progressing   

## 2020-10-27 NOTE — Progress Notes (Addendum)
   Subjective:  HD 4 Patient states he had chicken broth w/ broccoli yesterday and he vomited right away. Pt still c/o abdominal pain. States he needs some pain medication.   Objective: Vital signs in last 24 hours: Vitals:   10/26/20 1700 10/26/20 1843 10/26/20 2142 10/27/20 0522  BP: 109/74 108/72 136/88 122/90  Pulse: 90 83 86 80  Resp:  16 14 16   Temp:  (!) 97.5 F (36.4 C) 98 F (36.7 C) 97.7 F (36.5 C)  TempSrc:  Oral  Oral  SpO2:  95% 100% 98%  Weight:      Height:       Physical Exam  Gen: Patient lying on the bed.  Not in acute distress.  HEENT: Normocephalic, No icterus, hearing intact  Pulm: Breathing comfortably at room air, no cough, no distress. Abd: Soft, BS present, mildly tender in epigastric region Extm: ROM intact, no peripheral edema Skin: Dry, warm, normal skin turgor Neuro: Alert and oriented, answers question appropriately  psych: Normal mood and affect  Assessment/Plan:  Active Problems:   Nonspecific chest pain   Hypocalcemia   Hypomagnesemia   Rectal ulcer  Mr.Sandles is a 50 yo M w/ Pmh of Htn, WPW, EtOh abuse, recurrent pancreatitis, sickle cell trait, bipolar disorder, MDD presenting with abdominal pain due to chronic pancreatitis and lower GI bleed.   Chronic Pancreatitis Still complains of vomiting. On oxycodone 4 times daily for pain. GI increased oxycodone to 7.5 mg. Mg low at 1.8 (repleted). - NPO with sips with meds. - C/w Creon 82 units TID qc -Stop oxycodone -Start Dilaudid 0.5 mg every 4 hours as needed for pain. -Start D5-0.45% Nacl 50 ml/hr   Lower GI Bleed 2/2 distal rectum ulcer Blood loss anemia No bleeding per rectum but c/o constipation. Sigmoidoscopy showed bleeding ulcer treated w/ clips. EGD w/o any new findings (mucosal erythema consistent w/ prior EGD). Hgb decreased from 10.1 to 9.0.  No further GI bleed overnight.  Received IV feraheme 1 dose during admission - GI following.  Appreciate recs.   - C/w high-fiber  diet, fibercon, ansuol suppositories, sitzbath as needed - PPI 40mg  BID - Carafate 1g TID qc - Trend cbc - Start senna-S for constipation.  Hypocalcemia 2/2 vit D deficiency -Continue vitamin D repletion 50,000unit weekly   Alcohol Use Drinks 6 pack beer daily. Last CIWA 13 yesterday at 1700. - C/w CIWA. - Ativan PRN for CIWA >10. - C/w thiamine, folate, vitamins   [name], MD PGY-1 10/27/2020, 5:42 AM Pager: [phone] After 5pm on weekdays and 1pm on weekends: On Call Pager: [phone]

## 2020-10-27 NOTE — Progress Notes (Signed)
9:30 PM RN notified that patient had a small streak of blood in his stool.  He also endorsed abdominal pain and requested extra pain medication.  When evaluated at bedside, patient appeared to comfortable and in no acute distress.  Stated that he had a bathroom at 8 PM, stool was formed with a very small streak of old blood.  He denied nausea or vomiting.  Endorsed pain in the lower abdomen.  Physical exam was benign.  Abdomen was soft and nontender to palpation.  Bowel sounds present.  Low suspicion for bowel obstruction. No indication for KUB. Small streak of blood can be due to existing hemorrhoids or stercoral ulcer.  We had a long discussion about opioid causing constipation and patient verbalized understanding. Will continue IV Dilaudid 0.5 mg Q6h PRN. Continue NPO and bowel regimen.

## 2020-10-27 NOTE — Progress Notes (Addendum)
Patient ID: [name], male   DOB: 1971/02/28, 50 y.o.   MRN: [id number]    Progress Note   Subjective   Day # 4  CC; pancreatitis, rectal bleeding  EGD 10/25/2020 hiatal hernia, gastritis  Flex 10/25/2020-10 mm distal rectal ulcer -single clip placed ,and nonbleeding internal hemorrhoids  Labs today WBC 6.5/hemoglobin 9/hematocrit 28.3 LFTs within normal limit  CT 10/23/2020 diffuse calcifications throughout the pancreas consistent with chronic calcific pancreatitis no acute inflammatory changes multiple cystic areas throughout the pancreas largest 4.6 x 4.8 consistent with pancreatic pseudocyst, also areas of cystic pancreatic ductal dilation unchanged  Patient having complaining of increased abdominal pain this morning and has been made n.p.o., he relates having nausea and vomiting earlier this morning and last night Asked several questions about his endoscopic findings and CT scan  Expresses interest in going to a long-term facility or EtOH rehab facility directly from the hospital     Objective   Vital signs in last 24 hours: Temp:  [97.5 F (36.4 C)-98 F (36.7 C)] 97.7 F (36.5 C) (06/21 0522) Pulse Rate:  [80-92] 80 (06/21 0522) Resp:  [14-16] 16 (06/21 0522) BP: (108-136)/(72-92) 122/90 (06/21 0522) SpO2:  [95 %-100 %] 98 % (06/21 0522) Last BM Date: 10/25/20 General:   [name] in NAD Heart:  Regular rate and rhythm; no murmurs Lungs: Respirations even and unlabored, lungs CTA bilaterally Abdomen:  Soft, tender across the upper abdomen, no guarding or rebound nondistended. Normal bowel sounds. Extremities:  Without edema. Neurologic:  Alert and oriented,  grossly normal neurologically. Psych:  Cooperative. Normal mood and affect.   Lab Results: Recent Labs    10/25/20 0256 10/26/20 0659 10/27/20 0335  WBC 7.7 7.8 6.5  HGB 8.8* 10.1* 9.0*  HCT 27.7* 30.1* 28.3*  PLT 180 172 160   BMET Recent Labs    10/25/20 0256 10/26/20 0659  10/27/20 0335  NA 134* 139 138  K 3.9 3.9 3.7  CL 100 103 102  CO2 24 28 28   GLUCOSE 95 122* 98  BUN 7 <5* 5*  CREATININE 0.69 0.65 0.68  CALCIUM 8.8* 9.2 9.2   LFT Recent Labs    10/27/20 0335  PROT 6.3*  ALBUMIN 3.1*  AST 36  ALT 36  ALKPHOS 69  BILITOT 0.8     Assessment / Plan:    #81 50 year old [name] with severe chronic pancreatitis  with probable mild acute exacerbation He has chronic pancreatic pseudocysts. No acute inflammatory changes on most recent CT  #2 nausea and vomiting-may be secondary to chronic pancreatitis, EGD this admission pertinent only for gastritis. #3 chronic anemia 4.  Small rectal ulcer on sigmoidoscopy status post Endo Clip #5 history of polysubstance abuse  Plan; okay for n.p.o. today may have ice chips and meds Will change Zofran to around-the-clock Continue PPI and Carafate Continue oxycodone   Continue IV fluids Patient would benefit greatly from transfer to an alcohol rehab facility or residential setting upon discharge.    LOS: 2 days   Jason Esterwood PA-C 10/27/2020, 10:59 AM     I have also seen and evaluated the patient.  He looks a little better today to me but agree with plans as above.  I do not think we can transfer her to an alcohol rehab Facility or residential they have to be discharged and then enter on their own but it would be important to his overall health.  I am sure he will will have to arrange that himself  or perhaps care management can help facilitate.  Jason Mayer, MD, [facility] 10/27/2020 4:29 PM

## 2020-10-28 LAB — COMPREHENSIVE METABOLIC PANEL
ALT: 34 U/L (ref 0–44)
AST: 33 U/L (ref 15–41)
Albumin: 3 g/dL — ABNORMAL LOW (ref 3.5–5.0)
Alkaline Phosphatase: 77 U/L (ref 38–126)
Anion gap: 7 (ref 5–15)
BUN: 5 mg/dL — ABNORMAL LOW (ref 6–20)
CO2: 28 mmol/L (ref 22–32)
Calcium: 9.1 mg/dL (ref 8.9–10.3)
Chloride: 103 mmol/L (ref 98–111)
Creatinine, Ser: 0.64 mg/dL (ref 0.61–1.24)
GFR, Estimated: >60 mL/min (ref >60)
Glucose, Bld: 95 mg/dL (ref 70–99)
Potassium: 3.7 mmol/L (ref 3.5–5.1)
Sodium: 138 mmol/L (ref 135–145)
Total Bilirubin: 0.8 mg/dL (ref 0.3–1.2)
Total Protein: 6.1 g/dL — ABNORMAL LOW (ref 6.5–8.1)

## 2020-10-28 LAB — CBC
HCT: 28.4 % — ABNORMAL LOW (ref 39.0–52.0)
Hemoglobin: 9.2 g/dL — ABNORMAL LOW (ref 13.0–17.0)
MCH: 32.3 pg (ref 26.0–34.0)
MCHC: 32.4 g/dL (ref 30.0–36.0)
MCV: 99.6 fL (ref 80.0–100.0)
Platelets: 165 10*3/uL (ref 150–400)
RBC: 2.85 MIL/uL — ABNORMAL LOW (ref 4.22–5.81)
RDW: 15.3 % (ref 11.5–15.5)
WBC: 6.1 10*3/uL (ref 4.0–10.5)
nRBC: 0 % (ref 0.0–0.2)

## 2020-10-28 LAB — GLUCOSE, CAPILLARY: Glucose-Capillary: 80 mg/dL (ref 70–99)

## 2020-10-28 MED ORDER — HYDROMORPHONE HCL 1 MG/ML IJ SOLN
0.5000 mg | INTRAMUSCULAR | Status: DC | PRN
  Administered 2020-10-28 – 2020-10-29 (×7): 0.5 mg via INTRAVENOUS
  Filled 2020-10-28 (×7): qty 1

## 2020-10-28 MED ORDER — OXYCODONE HCL 5 MG PO TABS
5.0000 mg | ORAL_TABLET | Freq: Four times a day (QID) | ORAL | Status: DC
  Administered 2020-10-28 – 2020-10-29 (×6): 5 mg via ORAL
  Filled 2020-10-28 (×6): qty 1

## 2020-10-28 NOTE — Plan of Care (Addendum)
Pt had a small soft brown stool with small streak of old bloody mucous.  MD made aware.  Pt very vigilant about pain medication administration and seeks out staff when he feels it is due.  He endorses lower abd pain.  Pt also seen by nursing staff eating candy and chewing gum.  Reminders provided that he is NPO.  Ayesha Mohair BSN RN CMSRN    Problem: Elimination: Goal: Will not experience complications related to bowel motility Outcome: Not Progressing   Problem: Pain Managment: Goal: General experience of comfort will improve Outcome: Not Progressing

## 2020-10-28 NOTE — Progress Notes (Signed)
   Subjective:  HD 5, Overnight Pt had a small streak of blood in his stool.  He also endorsed abdominal pain and requested extra pain medication. Patient appeared to comfortable and in no acute distress. No extra pain medication were given.  Patient seen this morning at rounds.  Patient complains of blood in stool and persistent nausea and abdominal pain.  States he would like to try some liquids orally but still have some nausea.  Patient asking for more pain medications.  Patient asking about changing dietary habits.  Advised to increase fiber in diet.  Objective: Vital signs in last 24 hours: Vitals:   10/27/20 1059 10/27/20 1659 10/27/20 2059 10/28/20 0422  BP: 113/86 (!) 164/96 118/90 (!) 116/92  Pulse: (!) 102 (!) 110 92 98  Resp: 18 18 18 20   Temp: 98 F (36.7 C) 99.1 F (37.3 C) (!) 97.3 F (36.3 C) 98 F (36.7 C)  TempSrc: Oral Oral Oral Oral  SpO2: 100% 98% 99% 97%  Weight:      Height:       Physical Exam  Gen: Patient sitting in bed comfortably.  Not in acute distress.  HEENT: Normocephalic, no icterus, hearing intact.  Pulm: Breathing comfortably at room air, no cough, no distress. Abd: Soft, mildly tender in the epigastric region, BS present Skin: Dry, warm, normal skin turgor.  Neuro: Alert and oriented, answers questions appropriately psych: Normal mood and affect.    Assessment/Plan:  Active Problems:   Nonspecific chest pain   Hypocalcemia   Hypomagnesemia   Rectal ulcer  [name] is a 50 yo M w/ Pmh of Htn, WPW, EtOh abuse, recurrent pancreatitis, sickle cell trait, bipolar disorder, MDD presenting with abdominal pain due to chronic pancreatitis and lower GI bleed.   Chronic Pancreatitis C/O Abdominal pain at night. On Exam, abdomen soft, mildly tender in the epigastric region. -Advance diet as tolerated. - C/w Creon 82 units TID qc -Continue Dilaudid 0.5 mg every 6 hours as needed for pain. -Continue D5-0.45% Nacl 50 ml/hr   Lower GI Bleed 2/2  distal rectum ulcer Blood loss anemia Patient complained of blood in stools this morning and small streak of blood in his stool at night.  Hgb stable at 9.2. Received IV feraheme 1 dose during admission - GI following.  Appreciate recs.   - C/w high-fiber diet, fibercon, ansuol suppositories, sitzbath as needed - PPI 40mg  BID - Carafate 1g TID qc - Trend cbc - Start senna-S for constipation.  Vit D deficiency -Continue vitamin D 1000 mg Daily.    Alcohol Use Last CIWA 1(Tremor) yesterday at 4.22 - C/w CIWA. - Ativan PRN for CIWA >10. - C/w thiamine, folate, vitamins   [name], MD PGY-1 10/28/2020, 7:57 AM Pager: [phone] After 5pm on weekdays and 1pm on weekends: On Call Pager: [phone]

## 2020-10-28 NOTE — Progress Notes (Signed)
Patient ID: [name], male   DOB: July 02, 1970, 50 y.o.   MRN: [id number]    Progress Note   Subjective   Day # 5  CC; pancreatitis, rectal bleeding  Labs-WBC 6.1/hemoglobin 9.2 stable Creatinine 0.64, albumin 3, LFTs within normal limits  Patient continues to complain of about the same level of pain, and is asking for increase in pain medication.  He does not have any increase in his pain level with clear liquids, had increase in pain with solids.  He saw a small amount of blood with a bowel movement earlier today and had another bowel movement with no blood.     Objective   Vital signs in last 24 hours: Temp:  [97.3 F (36.3 C)-99.1 F (37.3 C)] 98.2 F (36.8 C) (06/22 0919) Pulse Rate:  [92-110] 109 (06/22 0919) Resp:  [18-20] 20 (06/22 0919) BP: (116-164)/(90-96) 117/90 (06/22 0919) SpO2:  [97 %-99 %] 99 % (06/22 0919) Last BM Date: 10/28/20 General:    [name] in NAD thin Heart:  Regular rate and rhythm; no murmurs Lungs: Respirations even and unlabored, lungs CTA bilaterally Abdomen:  Soft, tender across the upper abdomen no rebound ,non-distended. Normal bowel sounds. Extremities:  Without edema. Neurologic:  Alert and oriented,  grossly normal neurologically. Psych:  Cooperative. Normal mood and affect.  Intake/Output from previous day: 06/21 0701 - 06/22 0700 In: 1007.1 [P.O.:120; I.V.:887.1] Out: 900 [Urine:900] Intake/Output this shift: Total I/O In: 0  Out: 300 [Urine:300]  Lab Results: Recent Labs    10/26/20 0659 10/27/20 0335 10/28/20 0657  WBC 7.8 6.5 6.1  HGB 10.1* 9.0* 9.2*  HCT 30.1* 28.3* 28.4*  PLT 172 160 165   BMET Recent Labs    10/26/20 0659 10/27/20 0335 10/28/20 0657  NA 139 138 138  K 3.9 3.7 3.7  CL 103 102 103  CO2 28 28 28   GLUCOSE 122* 98 95  BUN <5* 5* 5*  CREATININE 0.65 0.68 0.64  CALCIUM 9.2 9.2 9.1   LFT Recent Labs    10/28/20 0657  PROT 6.1*  ALBUMIN 3.0*  AST 33  ALT 34   ALKPHOS 77  BILITOT 0.8   PT/INR No results for input(s): LABPROT, INR in the last 72 hours.       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 Patient is narcotic dependent, on chronic oxycodone as an outpatient 5 mg 4 times daily  #2 small rectal ulcer on sigmoidoscopy-no active bleeding, saw small amount of hematochezia earlier today, hemoglobin stable Will add an Anusol suppository at bedtime  Plan; allow clear liquids and supplements Continue PPI and Carafate Continue Zofran around-the-clock Will restart his home regimen of oxycodone 5 mg 4 times daily, and use low-dose Dilaudid as needed    Active Problems:   Nonspecific chest pain   Hypocalcemia   Hypomagnesemia   Rectal ulcer     LOS: 3 days   Baileigh Modisette PA-C 10/28/2020, 11:56 AM

## 2020-10-29 ENCOUNTER — Observation Stay (HOSPITAL_COMMUNITY)
Admission: EM | Admit: 2020-10-29 | Discharge: 2020-10-31 | Disposition: A | Discharge status: Home / Self Care | Payer: 59 | Attending: Internal Medicine | Admitting: Internal Medicine

## 2020-10-29 ENCOUNTER — Encounter (HOSPITAL_COMMUNITY): Payer: Self-pay | Admitting: Emergency Medicine

## 2020-10-29 ENCOUNTER — Other Ambulatory Visit: Payer: Self-pay

## 2020-10-29 DIAGNOSIS — Z79899 Other long term (current) drug therapy: Secondary | ICD-10-CM | POA: Insufficient documentation

## 2020-10-29 DIAGNOSIS — K648 Other hemorrhoids: Secondary | ICD-10-CM | POA: Insufficient documentation

## 2020-10-29 DIAGNOSIS — K861 Other chronic pancreatitis: Principal | ICD-10-CM | POA: Insufficient documentation

## 2020-10-29 DIAGNOSIS — J45909 Unspecified asthma, uncomplicated: Secondary | ICD-10-CM | POA: Insufficient documentation

## 2020-10-29 DIAGNOSIS — K859 Acute pancreatitis without necrosis or infection, unspecified: Secondary | ICD-10-CM | POA: Diagnosis present

## 2020-10-29 DIAGNOSIS — I10 Essential (primary) hypertension: Secondary | ICD-10-CM | POA: Diagnosis present

## 2020-10-29 DIAGNOSIS — Z91040 Latex allergy status: Secondary | ICD-10-CM | POA: Insufficient documentation

## 2020-10-29 DIAGNOSIS — D539 Nutritional anemia, unspecified: Secondary | ICD-10-CM | POA: Insufficient documentation

## 2020-10-29 DIAGNOSIS — Z20822 Contact with and (suspected) exposure to covid-19: Secondary | ICD-10-CM | POA: Insufficient documentation

## 2020-10-29 DIAGNOSIS — Y900 Blood alcohol level of less than 20 mg/100 ml: Secondary | ICD-10-CM | POA: Insufficient documentation

## 2020-10-29 DIAGNOSIS — F17210 Nicotine dependence, cigarettes, uncomplicated: Secondary | ICD-10-CM | POA: Insufficient documentation

## 2020-10-29 DIAGNOSIS — K2970 Gastritis, unspecified, without bleeding: Secondary | ICD-10-CM | POA: Diagnosis present

## 2020-10-29 LAB — CBC WITH DIFFERENTIAL/PLATELET
Abs Immature Granulocytes: 0.07 10*3/uL (ref 0.00–0.07)
Basophils Absolute: 0 10*3/uL (ref 0.0–0.1)
Basophils Relative: 0 %
Eosinophils Absolute: 0.1 10*3/uL (ref 0.0–0.5)
Eosinophils Relative: 1 %
HCT: 30.2 % — ABNORMAL LOW (ref 39.0–52.0)
Hemoglobin: 9.8 g/dL — ABNORMAL LOW (ref 13.0–17.0)
Immature Granulocytes: 1 %
Lymphocytes Relative: 8 %
Lymphs Abs: 1.2 10*3/uL (ref 0.7–4.0)
MCH: 32.8 pg (ref 26.0–34.0)
MCHC: 32.5 g/dL (ref 30.0–36.0)
MCV: 101 fL — ABNORMAL HIGH (ref 80.0–100.0)
Monocytes Absolute: 1.8 10*3/uL — ABNORMAL HIGH (ref 0.1–1.0)
Monocytes Relative: 12 %
Neutro Abs: 11.6 10*3/uL — ABNORMAL HIGH (ref 1.7–7.7)
Neutrophils Relative %: 78 %
Platelets: 171 10*3/uL (ref 150–400)
RBC: 2.99 MIL/uL — ABNORMAL LOW (ref 4.22–5.81)
RDW: 15.7 % — ABNORMAL HIGH (ref 11.5–15.5)
WBC: 14.8 10*3/uL — ABNORMAL HIGH (ref 4.0–10.5)
nRBC: 0 % (ref 0.0–0.2)

## 2020-10-29 LAB — COMPREHENSIVE METABOLIC PANEL
ALT: 40 U/L (ref 0–44)
AST: 33 U/L (ref 15–41)
Albumin: 3.6 g/dL (ref 3.5–5.0)
Alkaline Phosphatase: 87 U/L (ref 38–126)
Anion gap: 13 (ref 5–15)
BUN: <5 mg/dL — ABNORMAL LOW (ref 6–20)
CO2: 22 mmol/L (ref 22–32)
Calcium: 9.1 mg/dL (ref 8.9–10.3)
Chloride: 98 mmol/L (ref 98–111)
Creatinine, Ser: 0.64 mg/dL (ref 0.61–1.24)
GFR, Estimated: >60 mL/min (ref >60)
Glucose, Bld: 116 mg/dL — ABNORMAL HIGH (ref 70–99)
Potassium: 3.5 mmol/L (ref 3.5–5.1)
Sodium: 133 mmol/L — ABNORMAL LOW (ref 135–145)
Total Bilirubin: 0.9 mg/dL (ref 0.3–1.2)
Total Protein: 7.1 g/dL (ref 6.5–8.1)

## 2020-10-29 LAB — LIPASE, BLOOD: Lipase: 22 U/L (ref 11–51)

## 2020-10-29 LAB — BASIC METABOLIC PANEL
Anion gap: 11 (ref 5–15)
BUN: <5 mg/dL — ABNORMAL LOW (ref 6–20)
CO2: 26 mmol/L (ref 22–32)
Calcium: 9 mg/dL (ref 8.9–10.3)
Chloride: 100 mmol/L (ref 98–111)
Creatinine, Ser: 0.71 mg/dL (ref 0.61–1.24)
GFR, Estimated: >60 mL/min (ref >60)
Glucose, Bld: 180 mg/dL — ABNORMAL HIGH (ref 70–99)
Potassium: 3.6 mmol/L (ref 3.5–5.1)
Sodium: 137 mmol/L (ref 135–145)

## 2020-10-29 LAB — CBC
HCT: 27.6 % — ABNORMAL LOW (ref 39.0–52.0)
Hemoglobin: 8.9 g/dL — ABNORMAL LOW (ref 13.0–17.0)
MCH: 32.4 pg (ref 26.0–34.0)
MCHC: 32.2 g/dL (ref 30.0–36.0)
MCV: 100.4 fL — ABNORMAL HIGH (ref 80.0–100.0)
Platelets: 157 10*3/uL (ref 150–400)
RBC: 2.75 MIL/uL — ABNORMAL LOW (ref 4.22–5.81)
RDW: 15.4 % (ref 11.5–15.5)
WBC: 6.9 10*3/uL (ref 4.0–10.5)
nRBC: 0 % (ref 0.0–0.2)

## 2020-10-29 MED ORDER — VITAMIN D3 25 MCG PO TABS: 1000.0000 [IU] | ORAL_TABLET | Freq: Every day | ORAL | 0 refills | Status: DC

## 2020-10-29 MED ORDER — ALUM & MAG HYDROXIDE-SIMETH 200-200-20 MG/5ML PO SUSP
15.0000 mL | Freq: Once | ORAL | Status: AC
  Administered 2020-10-29: 15 mL via ORAL
  Filled 2020-10-29: qty 30

## 2020-10-29 MED ORDER — PANTOPRAZOLE SODIUM 40 MG PO TBEC: 40.0000 mg | DELAYED_RELEASE_TABLET | Freq: Two times a day (BID) | ORAL | 0 refills | Status: DC

## 2020-10-29 NOTE — Discharge Instructions (Addendum)
Dear [name],   Thank you for letting us participate in your care! In this section, you will find a brief hospital admission summary of why you were admitted to the hospital, what happened during your admission, your diagnosis/diagnoses, and recommended follow up.  You were admitted because you were experiencing twitching movements of hands and legs and abdominal pain  Your testing revealed hypocalcemia, acute on chronic pancreatitis, gastritis, rectal and anal ulcer, and second degree hemorrhoids.   You were treated with pain medications, Protonix and Carafate.  You were also seen by Gastroenterology. They recommended endoscopy and sigmoidoscopy.   Your symptoms improved and you were discharged from the hospital for meeting this goal.    POST-HOSPITAL & CARE INSTRUCTIONS Please follow up with Gastroenterology Dr. Havery Moros. Follow up with PCP in 1 week for hospital follow up. You were given resources for homeless shelters and AA meeting and Rehab programs. Please call them to find bed at rehab. We stopped your antihypertensive medications in the hospital. Please follow up with PCP to restart as needed.  You were advised to stop alcohol and continue medications as advised.  Please let PCP/Specialists know of any changes in medications that were made.  Please see medications section of this packet for any medication changes.   DOCTOR'S APPOINTMENTS & FOLLOW UP No future appointments.   Thank you for choosing [facility]! Take care and be well!  [facility]  [address] [place], [place] [address] [phone]

## 2020-10-29 NOTE — Progress Notes (Signed)
DISCHARGE NOTE HOME [name] to be discharged Home per MD order. Discussed prescriptions and follow up appointments with the patient. Prescriptions given to patient; medication list explained in detail. Patient verbalized understanding.  Skin clean, dry and intact without evidence of skin break down, no evidence of skin tears noted. IV catheter discontinued intact. Site without signs and symptoms of complications. Dressing and pressure applied. Pt denies pain at the site currently. No complaints noted.  Patient free of lines, drains, and wounds.   An After Visit Summary (AVS) was printed and given to the patient. Patient escorted via wheelchair, and discharged home via private auto.  Berneta Levins, RN

## 2020-10-29 NOTE — Progress Notes (Signed)
   Subjective:  No acute overnight events. Patient seen at rounds this morning.  Patient states he feels heartburn and some abdominal pain. Pt states he had 3 bowel movements since yesterday. Denies any bleeding per rectum. Pt will call his relatives where he was living before of he can go there at discharge.  Objective: Vital signs in last 24 hours: Vitals:   10/28/20 0919 10/28/20 1806 10/28/20 2055 10/29/20 0503  BP: 117/90 (!) 149/95 (!) 147/96 111/76  Pulse: (!) 109 97 90 84  Resp: 20 19 18 18   Temp: 98.2 F (36.8 C) 98.4 F (36.9 C) 98.3 F (36.8 C) 98.2 F (36.8 C)  TempSrc: Oral Oral Oral Oral  SpO2: 99% 100% 99% 98%  Weight:   56.2 kg   Height:        Physical Exam  Gen: Patient sitting in bed comfortably, not in acute distress  HEENT: Normocephalic, no icterus, hearing intact  Pulm: Effort normal, breathing comfortably at room air, no cough, no distress  Abd: Soft, mildly tender in the epigastric region, bowel sounds present  skin: Dry, warm, normal skin turgor Neuro: Alert and oriented, answers question appropriately  psych: Anxious mood and affect. Assessment/Plan:  Active Problems:   Chronic alcoholic pancreatitis (HCC)   Nonspecific chest pain   Hypocalcemia   Hypomagnesemia   Rectal ulcer  Mr.Cassatt is a 50 yo M w/ Pmh of Htn, WPW, EtOh abuse, recurrent pancreatitis, sickle cell trait, bipolar disorder, MDD presenting with abdominal pain due to chronic pancreatitis and lower GI bleed.   Chronic Pancreatitis C/O heartburn and abdominal pain this morning. On Exam, abdomen soft, mildly tender in the epigastric region.  GI signed off. No additional testing needed. Will arrange follow up with Dr Luvenia Starch. Pt can be discharge today. -GI signed off. -Advance diet as tolerated. -Xylocaine solution as needed for heartburn. - C/w Creon 82 units TID qc -Continue Dilaudid 0.5 mg every 6 hours as needed for pain. -Continue D5-0.45% Nacl 50 ml/hr  -Oxycodone 4 times  daily  -Anticipating discharge today.   Lower GI Bleed 2/2 distal rectum ulcer Blood loss anemia Pt denies any blood in stool. Hgb stable at 8.9. Received IV feraheme 1 dose during admission - C/w high-fiber diet, fibercon, ansuol suppositories, sitzbath as needed - PPI 40mg  BID.  - Carafate 1g TID qc - Start senna-S for constipation. - Ansul Suppository  Vit D deficiency -Continue vitamin D 1000 mg Daily.    Alcohol Use Last CIWA 0 - C/w thiamine, folate, vitamins   [name], MD PGY-1 10/29/2020, 6:51 AM Pager: [phone] After 5pm on weekdays and 1pm on weekends: On Call Pager: [phone]

## 2020-10-29 NOTE — ED Provider Notes (Signed)
Emergency Medicine Provider Triage Evaluation Note  Quinntin Ulyess Muto , a 50 y.o. male  was evaluated in triage.  Pt complains of continued abdominal pain, nausea, vomiting, & bright red blood per rectum. No alleviating/aggravating factors. Discharged from the hospital earlier today. States he does not feel better.   Review of Systems  Positive: Nausea, vomiting, abdominal pain, BRBPR Negative: Fever, hematemesis, syncope  Physical Exam  BP (!) 134/91 (BP Location: Left Arm)   Pulse (!) 124   Temp 99.6 F (37.6 C) (Oral)   Resp 20   SpO2 98%  Gen:   Awake, no distress   Resp:  Normal effort  Cardiac: Tachycardic.  MSK:   Moves extremities without difficulty  Other:  Abdomen- mild upper abdominal tenderness, no peritoneal signs.   Medical Decision Making  Medically screening exam initiated at 10:14 PM.  Appropriate orders placed.  Laquincy Dionte Blaustein was informed that the remainder of the evaluation will be completed by another provider, this initial triage assessment does not replace that evaluation, and the importance of remaining in the ED until their evaluation is complete.  Abdominal pain.    Amaryllis Dyke, PA-C 10/29/20 2222    Lennice Sites, DO 10/30/20 0036

## 2020-10-29 NOTE — ED Triage Notes (Signed)
Pt c/o continued abdominal pain related to chronic pancreatitis and blood in his stool. D/c from hospital today.

## 2020-10-29 NOTE — Discharge Summary (Signed)
Name: [name] MRN: [id number] DOB: [birth year] 50 y.o. PCP: Simona Huh, NP  Date of Admission: 10/23/2020 12:39 PM Date of Discharge:  10/29/20 Attending Physician: Lucious Groves, DO   Discharge Diagnosis: Chronic pancreatitis Hypocalcemia Alcohol abuse disorder Gastritis Rectal ulcer Hemorrhoids Asthma Hypertension Depression  Discharge Medications: Allergies as of 10/29/2020       Reactions   Robaxin [methocarbamol] Other (See Comments)   "jumpy limbs"   Shellfish-derived Products Nausea And Vomiting   Trazodone And Nefazodone Other (See Comments)   Muscle spasms   Adhesive [tape] Itching   Latex Itching   Toradol [ketorolac Tromethamine] Other (See Comments)   Has ulcers; cannot have this   Contrast Media [iodinated Diagnostic Agents] Hives   Reglan [metoclopramide] Other (See Comments)   Muscle spasms        Medication List     STOP taking these medications    hydrALAZINE 25 MG tablet Commonly known as: APRESOLINE   metoprolol 200 MG 24 hr tablet Commonly known as: TOPROL-XL   metoprolol tartrate 100 MG tablet Commonly known as: LOPRESSOR   omeprazole 40 MG capsule Commonly known as: PRILOSEC Replaced by: pantoprazole 40 MG tablet   potassium chloride 10 MEQ CR capsule Commonly known as: MICRO-K   QUEtiapine 50 MG tablet Commonly known as: SEROQUEL   verapamil 120 MG CR tablet Commonly known as: CALAN-SR   VITAMIN B-12 PO   Vitamin D (Ergocalciferol) 1.25 MG (50000 UNIT) Caps capsule Commonly known as: DRISDOL       TAKE these medications    albuterol 108 (90 Base) MCG/ACT inhaler Commonly known as: VENTOLIN HFA Inhale 2 puffs into the lungs every 4 (four) hours as needed for wheezing or shortness of breath.   Breo Ellipta 200-25 MCG/INH Aepb Generic drug: fluticasone furoate-vilanterol Inhale 1 puff into the lungs daily. What changed:  when to take this reasons to take this   cyclobenzaprine 10 MG  tablet Commonly known as: FLEXERIL Take 1 tablet by mouth 3 (three) times daily as needed for muscle spasms.   folic acid 1 MG tablet Commonly known as: FOLVITE Take 1 tablet (1 mg total) by mouth daily.   lidocaine 2 % solution Commonly known as: XYLOCAINE Use as directed 15 mLs in the mouth or throat every 6 (six) hours as needed for mouth pain.   lipase/protease/amylase 36000 UNITS Cpep capsule Commonly known as: CREON Take 2 capsules (72,000 Units total) by mouth 3 (three) times daily with meals. For pancreatitis What changed: additional instructions   multivitamin with minerals Tabs tablet Take 1 tablet by mouth daily.   ondansetron 4 MG disintegrating tablet Commonly known as: ZOFRAN-ODT Take 4 mg by mouth 2 (two) times daily as needed for vomiting or nausea.   oxyCODONE-acetaminophen 5-325 MG tablet Commonly known as: PERCOCET/ROXICET Take 1 tablet by mouth 4 (four) times daily as needed for moderate pain or severe pain.   pantoprazole 40 MG tablet Commonly known as: PROTONIX Take 1 tablet (40 mg total) by mouth 2 (two) times daily. Replaces: omeprazole 40 MG capsule   sertraline 100 MG tablet Commonly known as: ZOLOFT Take 1 tablet (100 mg total) by mouth daily.   sucralfate 1 g tablet Commonly known as: Carafate Take 1 tablet (1 g total) by mouth 4 (four) times daily -  with meals and at bedtime.   thiamine 100 MG tablet Take 1 tablet (100 mg total) by mouth daily.   Vitamin D3 25 MCG tablet Commonly known as: Vitamin D  Take 1 tablet (1,000 Units total) by mouth daily. Start taking on: October 30, 2020        Disposition and follow-up:   [name] was discharged from [facility] in Stable condition.  At the hospital follow up visit please address:  1.  Follow up: Follow up with Gastroenterology with Dr. Havery Moros. Follow up with your PCP in 1 week for hospital follow up. Antihypertensive medications were stopped in the  hospital.  Pt was given resources for homeless shelters, AA meeting and Rehab programs. Pt will follow up by calling them himself.  2.  Labs / imaging needed at time of follow-up: CBC  3.  Pending labs/ test needing follow-up: None  Follow-up Appointments:  Follow-up [place] Follow up.   Why: they have social workers, resources, Social research officer, government. for help you can stay there as they are open, Contact information: [place]        [place] meetings Follow up.   Contact information: online  for schedule go to [email or web address]        Simona Huh, NP. Schedule an appointment as soon as possible for a visit in 1 week(s).   Specialty: Nurse Practitioner Why: follow up in 1 week. Contact information: [place] [address] [phone]         Yetta Flock, MD. Schedule an appointment as soon as possible for a visit in 1 week(s).   Specialty: Gastroenterology Contact information: [place] [address] [phone]                 Hospital Course by problem list: Hypocalcemia Patient presented with right arm and leg jerking. Ca 6.9 at admission.  Pt received 1000 mg calcium gluconate in the ED. PTH level wnl. Vitamin D low at 25.05. Recommended to start 1000 mcg Vitamin D. Calcium rechecked again daily but remain stable after that.  Chronic pancreatitis Lipase mildly elevated at 55. CT abdomen pelvis shows Sequela from chronic calcific pancreatitis, with continued cystic dilatation of the pancreatic duct and enlarging pancreatic pseudocysts. No inflammation. Pt was kept NPO initially and started on diet as tolerated. [name] symptoms managed with pain medications, Creon and iv fluids. 2 nd degree Hemorrhoids Gastritis Rectal ulcer Pt c/o bright red blood mixed with stool. No fissure, fistula, hemorrhoids on anal exam. FOBT negative. GI consulted, EGD and sigmoidoscopy showed  gastritis, single (solitary) ulcer in the distal rectum which was clipped and Non-bleeding non-thrombosed internal hemorrhoids. CBC monitored showed stable Hb. Biopsy negative for H pylori and dysplasia. [name] symptoms managed with Sitz bath, PPI and Carafate. Pt recommended to increase fibre in his diet. Recommended to Follow up with Dr. Havery Moros in 1 week.  Alcohol abuse Managed with CIWA, Ativan , Thiamin, multivitamin and folic acid. Pt was given resources for Alcohol rehab programs , AA meetings by CSW at discharge.   Hypertension Pt BP remained stable without any antihypertensive medications. Recommended to stop medications until PCP follow up. PCP may restart depending on BP.   Subjective on day of discharge: Patient states he feels heartburn and some abdominal pain. Pt states he had 3 bowel movements since yesterday. Denies any bleeding per rectum. He is tolerating his diet. Pt will call his relatives where he was living before of he can go there at discharge.   Discharge Exam:   BP 114/87 (BP Location: Left Arm)   Pulse 90   Temp  98.6 F (37 C) (Oral)   Resp 18   Ht (P) 5\' 8"  (1.727 m)   Wt 123 lb 14.4 oz (56.2 kg)   SpO2 99%   BMI (P) 18.84 kg/m  Discharge exam:  Gen: Patient sitting in bed comfortably, not in acute distress  HEENT: Normocephalic, no icterus, hearing intact  Pulm: Effort normal, breathing comfortably at room air, no cough, no distress Abd: Soft, mildly tender in the epigastric region, bowel sounds present skin: Dry, warm, normal skin turgor Neuro: Alert and oriented, answers question appropriately  psych: Anxious mood and affect. Pertinent Labs, Studies, and Procedures:  CT ABDOMEN PELVIS WO CONTRAST  Result Date: 10/23/2020 CLINICAL DATA:  Left-sided chest pain, epigastric pain EXAM: CT ABDOMEN AND PELVIS WITHOUT CONTRAST TECHNIQUE: Multidetector CT imaging of the abdomen and pelvis was performed following the standard protocol without IV contrast.  COMPARISON:  10/23/2020, 08/08/2019, 11/22/2019 FINDINGS: Lower chest: No acute pleural or parenchymal lung disease. Unenhanced CT was performed per clinician order. Lack of IV contrast limits sensitivity and specificity, especially for evaluation of abdominal/pelvic solid viscera. Hepatobiliary: No focal liver abnormality is seen. No gallstones, gallbladder wall thickening, or biliary dilatation. Pancreas: Diffuse calcifications are seen throughout the pancreas consistent with sequela of chronic calcific pancreatitis. No acute inflammatory changes are seen. Multiple cystic areas are seen throughout the pancreatic parenchyma, largest at the junction of the body and tail measuring 4.6 x 4.8 cm, most consistent with pancreatic pseudocyst. Areas of cystic pancreatic duct dilation are also noted, unchanged since prior MRI. Spleen: Normal in size without focal abnormality. Adrenals/Urinary Tract: No urinary tract calculi or obstructive uropathy. The adrenals and bladder are unremarkable. Stomach/Bowel: No bowel obstruction or ileus. There is moderate retained stool throughout the colon. Normal appendix right lower quadrant. Chronic gastric wall thickening may reflect gastritis. Endoscopy recommended if not previously performed. Vascular/Lymphatic: Aortic atherosclerosis. No enlarged abdominal or pelvic lymph nodes. Reproductive: Prostate is unremarkable. Other: No free fluid or free gas.  No abdominal wall hernia. Musculoskeletal: No acute or destructive bony lesions. Reconstructed images demonstrate no additional findings. IMPRESSION: 1. Sequela from chronic calcific pancreatitis, with continued cystic dilatation of the pancreatic duct and enlarging pancreatic pseudocysts as above. No acute inflammatory changes. 2. Chronic gastric wall thickening which may reflect sequela from gastritis. Endoscopy may be useful if not previously performed. 3.  Aortic Atherosclerosis (ICD10-I70.0). Electronically Signed   By: Randa Ngo M.D.   On: 10/23/2020 23:39   DG Chest Port 1 View  Result Date: 10/23/2020 CLINICAL DATA:  Left-sided chest pain EXAM: PORTABLE CHEST 1 VIEW COMPARISON:  10/04/2020 FINDINGS: The heart size and mediastinal contours are within normal limits. Mildly hyperinflated lungs. No focal airspace consolidation, pleural effusion, or pneumothorax. The visualized skeletal structures are unremarkable. IMPRESSION: No active disease. Electronically Signed   By: Davina Poke D.O.   On: 10/23/2020 14:13   Discharge Instructions: Discharge Instructions     Call MD for:  difficulty breathing, headache or visual disturbances   Complete by: As directed    Call MD for:  extreme fatigue   Complete by: As directed    Call MD for:  severe uncontrolled pain   Complete by: As directed    Diet - low sodium heart healthy   Complete by: As directed    Increase activity slowly   Complete by: As directed       POST-HOSPITAL & CARE INSTRUCTIONS Please follow up with Gastroenterology Dr. Havery Moros. Follow up with PCP in 1 week for  hospital follow up. You were given resources for homeless shelters and AA meeting and Rehab programs. Please call them to find bed at rehab. We stopped your antihypertensive medications in the hospital. Please follow up with PCP to restart as needed.  You were advised to stop alcohol and continue medications as advised.  Please let PCP/Specialists know of any changes in medications that were made.  Please see medications section of this packet for any medication changes.    Signed: [name], MD 10/29/2020, 6:45 PM   Pager: [phone]

## 2020-10-29 NOTE — Progress Notes (Addendum)
Patient ID: [name], male   DOB: 1971/01/15, 50 y.o.   MRN: [id number]    Progress Note   Subjective   Day # 5 CC; acute exacerbation of chronic pancreatitis, abdominal pain  Labs, hemoglobin 8.9 stable, WBC 6.9  Complaining of increased abdominal pain this a.m., burning in nature.  Remains on clear liquids says he could not eat much this morning Nurse reports he has been able to be up and ambulating etc.     Objective   Vital signs in last 24 hours: Temp:  [98.2 F (36.8 C)-98.4 F (36.9 C)] 98.4 F (36.9 C) (06/23 0931) Pulse Rate:  [84-97] 89 (06/23 0931) Resp:  [18-19] 18 (06/23 0931) BP: (111-149)/(76-96) 111/83 (06/23 0931) SpO2:  [98 %-100 %] 99 % (06/23 0931) Weight:  [56.2 kg] 56.2 kg (06/22 2055) Last BM Date: 10/29/20 General:    [name] in NAD Heart:  Regular rate and rhythm; no murmurs Lungs: Respirations even and unlabored, lungs CTA bilaterally Abdomen:  Soft, tender across the upper and mid abdomen, no rebound nondistended. Normal bowel sounds. Extremities:  Without edema. Neurologic:  Alert and oriented,  grossly normal neurologically. Psych:  Cooperative. Normal mood and affect.  Intake/Output from previous day: 06/22 0701 - 06/23 0700 In: 808.3 [P.O.:360; I.V.:448.3] Out: 2400 [Urine:2400] Intake/Output this shift: Total I/O In: 240 [P.O.:240] Out: 950 [Urine:950]  Lab Results: Recent Labs    10/27/20 0335 10/28/20 0657 10/29/20 0407  WBC 6.5 6.1 6.9  HGB 9.0* 9.2* 8.9*  HCT 28.3* 28.4* 27.6*  PLT 160 165 157   BMET Recent Labs    10/27/20 0335 10/28/20 0657 10/29/20 0407  NA 138 138 137  K 3.7 3.7 3.6  CL 102 103 100  CO2 28 28 26   GLUCOSE 98 95 180*  BUN 5* 5* <5*  CREATININE 0.68 0.64 0.71  CALCIUM 9.2 9.1 9.0   LFT Recent Labs    10/28/20 0657  PROT 6.1*  ALBUMIN 3.0*  AST 33  ALT 34  ALKPHOS 77  BILITOT 0.8   PT/INR No results for input(s): LABPROT, INR in the last 72  hours.       Assessment / Plan:    #36 50 year old [name] with known diffuse calcific chronic pancreatitis with most recent imaging on admission showing multiple cystic areas throughout the pancreas and a 4.6 x 4.8 cm pancreatic pseudocyst admitted with worsening abdominal pain nausea vomiting  He really has not had much improvement in symptoms since admission.  He has been started back on his chronic narcotic regimen but is requiring IV analgesics as well.  Symptoms are consistent with acute exacerbation of chronic pancreatitis  EGD this admission pertinent for gastritis  Patient is on Zoloft, consider adding trial of amitriptyline 50 mg as an adjunct for pain control  #2 anemia chronic #3 small rectal ulcer on sigmoidoscopy status post Endo Clip no active bleeding  #4 homeless #5 EtOH dependence  Plan; continue oxycodone-consider increasing to 7.5 mg Continue 0.5 Dilaudid every 3 to 4 hours Continue liquids for now, okay to advance to full liquids as tolerates Continue Zofran around-the-clock Resume Creon when taking solids Patient was encouraged again today to make contact with the outpatient rehab/residential facilities so that hopefully he can transition somewhere on discharge.     Active Problems:   Chronic alcoholic pancreatitis (HCC)   Nonspecific chest pain   Hypocalcemia   Hypomagnesemia   Rectal ulcer     LOS: 4 days   Amy Esterwood  PA-C 10/29/2020, 11:54 AM       GI Attending   I have taken an interval history, reviewed the chart and examined the patient. I agree with the Advanced Practitioner's note, impression and recommendations.   Tough situation.  He is homeless - ? Impact of that Has real dz process that causes pain also  I have never been a fan of Creon for pain but we can continue  Gatha Mayer, MD, [facility] 10/29/2020 3:11 PM

## 2020-10-29 NOTE — TOC Progression Note (Signed)
Transition of Care [facility]) - Progression Note    Patient Details  Name: [name] MRN: [id number] Date of Birth: 03-Oct-1970  Transition of Care [facility]) CM/SW Contact  Sharlet Salina Mila Homer, LCSW Phone Number: 10/29/2020, 5:43 PM  Clinical Narrative:  Patient discharging today. CSW received consult to provide patient with substance abuse resources. Mr. Snarski was given information regarding [facility] for their Substance Abuse Intensive Outpatient Program. CSW also offered information on [facility] for inpatient substance abuse treatment, and patient expressed knowledge regarding this agency and declined the information. Mr. Rauch was encouraged to follow-up regarding  [facility] treatment program.      Expected Discharge Plan: Home/Self Care Barriers to Discharge: Homeless with medical needs (ETOH)  Expected Discharge Plan and Services Expected Discharge Plan: Home/Self Care       Living arrangements for the past 2 months: Homeless Expected Discharge Date: 10/29/20                                    Social Determinants of Health (SDOH) Interventions  Patient needing assistance with substance abuse    Readmission Risk Interventions Readmission Risk Prevention Plan 08/29/2019 10/15/2018  Transportation Screening Complete Complete  Medication Review Press photographer) Complete Complete  PCP or Specialist appointment within 3-5 days of discharge Complete Complete  HRI or [place] Complete Complete  SW Recovery Care/Counseling Consult Complete Complete  Palliative Care Screening Not Applicable Not [place] Not Applicable Not Applicable  Some recent data might be hidden

## 2020-10-29 NOTE — TOC Initial Note (Addendum)
Transition of Care [facility]) - Initial/Assessment Note    Patient Details  Name: [name] MRN: [id number] Date of Birth: 1970-08-18  Transition of Care [facility]) CM/SW Contact:    Verdell Carmine, RN Phone Number: 10/29/2020, 12:49 PM  Clinical Narrative:                  Patient requesting alcohol rehabilitation, and has no home at the moment,was living with his mother prior to admission also is requesting on day of discharge, medication assistance. Patient is not eligible for MATCH because he has insurance. CSW will address alcohol  rehabilitation needs. And resources/ Homeless resources can be provided.  Expected Discharge Plan: Home/Self Care Barriers to Discharge: Homeless with medical needs (ETOH)   Patient Goals and CMS Choice        Expected Discharge Plan and Services Expected Discharge Plan: Home/Self Care       Living arrangements for the past 2 months: Homeless                                      Prior Living Arrangements/Services Living arrangements for the past 2 months: Homeless   Patient language and need for interpreter reviewed:: Yes        Need for Family Participation in Patient Care: Yes (Comment) Care giver support system in place?: Yes (comment)   Criminal Activity/Legal Involvement Pertinent to Current Situation/Hospitalization: No - Comment as needed  Activities of Daily Living Home Assistive Devices/Equipment: None ADL Screening (condition at time of admission) Patient's cognitive ability adequate to safely complete daily activities?: Yes Is the patient deaf or have difficulty hearing?: No Does the patient have difficulty seeing, even when wearing glasses/contacts?: No Does the patient have difficulty concentrating, remembering, or making decisions?: No Patient able to express need for assistance with ADLs?: Yes Does the patient have difficulty dressing or bathing?: No Independently performs ADLs?: Yes (appropriate for  developmental age) Does the patient have difficulty walking or climbing stairs?: No Weakness of Legs: None Weakness of Arms/Hands: None  Permission Sought/Granted                  Emotional Assessment       Orientation: : Oriented to Situation, Oriented to  Time, Oriented to Place, Oriented to Self Alcohol / Substance Use: Alcohol Use Psych Involvement: No (comment)  Admission diagnosis:  Hypocalcemia [E83.51] Hypomagnesemia [E83.42] Nonspecific chest pain [R07.9] Patient Active Problem List   Diagnosis Date Noted   Rectal ulcer    Hypomagnesemia    Hypocalcemia 10/23/2020   AKI (acute kidney injury) ([place]) 11/13/2018   Seizure ([place]) 11/13/2018   Gastritis and gastroduodenitis    Nonspecific chest pain 01/08/2018   GI bleed 11/24/2017   Acute blood loss anemia 11/24/2017   Atypical chest pain 11/24/2017   Acute pancreatitis 09/28/2017   Abdominal pain 05/27/2017   Hematemesis 05/27/2017   Tachycardia 03/18/2017   Diarrhea 03/18/2017   Acute on chronic pancreatitis ([place]) 12/17/2016   Chronic alcoholic pancreatitis ([place]) 12/05/2016   Intractable nausea and vomiting 12/05/2016   Verbally abusive behavior 12/05/2016   Normocytic anemia 12/05/2016   Alcohol use disorder, severe, dependence ([place]) 07/25/2016   Cocaine use disorder, severe, dependence ([place]) 07/25/2016   Major depressive disorder, recurrent severe without psychotic features ([place]) 07/20/2016   Leukocytosis    Hospital acquired PNA 05/20/2015   Chronic pancreatitis ([place]) 05/18/2015   Pseudocyst of pancreas  05/18/2015   Polysubstance abuse (tobacco, cocaine, THC, and ETOH) 03/26/2015   Alcohol-induced chronic pancreatitis ([place])    Essential hypertension 02/06/2014   Mood disorder ([place]) 02/06/2014   Alcohol-induced acute pancreatitis 11/28/2013   Pancreatic pseudocyst/cyst 11/25/2013   Severe protein-calorie malnutrition ([place]) 10/10/2013   Suicide attempt ([place]) 10/08/2013   [name] Parkinson White pattern  seen on electrocardiogram 10/03/2012   TOBACCO ABUSE 03/23/2007   PCP:  Simona Huh, NP Pharmacy:   [facility] [place] [address] Phone: [phone] Fax: [place], [address] [place] [address] Phone: [phone] Fax: [phone]     Social Determinants of Health (SDOH) Interventions    Readmission Risk Interventions Readmission Risk Prevention Plan 08/29/2019 10/15/2018  Transportation Screening Complete Complete  Medication Review Press photographer) Complete Complete  PCP or Specialist appointment within 3-5 days of discharge Complete Complete  HRI or [place] Complete Complete  SW Recovery Care/Counseling Consult Complete Complete  Palliative Care Screening Not Applicable Not [place] Not Applicable Not Applicable  Some recent data might be hidden

## 2020-10-29 NOTE — Progress Notes (Signed)
Paged provider patient complaining pain getting worse.

## 2020-10-30 ENCOUNTER — Emergency Department (HOSPITAL_COMMUNITY): Payer: 59

## 2020-10-30 DIAGNOSIS — K861 Other chronic pancreatitis: Secondary | ICD-10-CM

## 2020-10-30 DIAGNOSIS — K2970 Gastritis, unspecified, without bleeding: Secondary | ICD-10-CM

## 2020-10-30 DIAGNOSIS — K859 Acute pancreatitis without necrosis or infection, unspecified: Secondary | ICD-10-CM | POA: Diagnosis present

## 2020-10-30 DIAGNOSIS — K2990 Gastroduodenitis, unspecified, without bleeding: Secondary | ICD-10-CM | POA: Diagnosis not present

## 2020-10-30 LAB — PHOSPHORUS: Phosphorus: 3.7 mg/dL (ref 2.5–4.6)

## 2020-10-30 LAB — LIPID PANEL
Cholesterol: 175 mg/dL (ref 0–200)
HDL: 89 mg/dL (ref >40)
LDL Cholesterol: 78 mg/dL (ref 0–99)
Total CHOL/HDL Ratio: 2 RATIO
Triglycerides: 39 mg/dL (ref <150)
VLDL: 8 mg/dL (ref 0–40)

## 2020-10-30 LAB — CBC
HCT: 29.3 % — ABNORMAL LOW (ref 39.0–52.0)
Hemoglobin: 9.4 g/dL — ABNORMAL LOW (ref 13.0–17.0)
MCH: 32.2 pg (ref 26.0–34.0)
MCHC: 32.1 g/dL (ref 30.0–36.0)
MCV: 100.3 fL — ABNORMAL HIGH (ref 80.0–100.0)
Platelets: 155 10*3/uL (ref 150–400)
RBC: 2.92 MIL/uL — ABNORMAL LOW (ref 4.22–5.81)
RDW: 15.8 % — ABNORMAL HIGH (ref 11.5–15.5)
WBC: 14.3 10*3/uL — ABNORMAL HIGH (ref 4.0–10.5)
nRBC: 0 % (ref 0.0–0.2)

## 2020-10-30 LAB — RAPID URINE DRUG SCREEN, HOSP PERFORMED
Amphetamines: NOT DETECTED
Barbiturates: NOT DETECTED
Benzodiazepines: NOT DETECTED
Cocaine: NOT DETECTED
Opiates: NOT DETECTED
Tetrahydrocannabinol: NOT DETECTED

## 2020-10-30 LAB — COMPREHENSIVE METABOLIC PANEL
ALT: 34 U/L (ref 0–44)
AST: 26 U/L (ref 15–41)
Albumin: 3.2 g/dL — ABNORMAL LOW (ref 3.5–5.0)
Alkaline Phosphatase: 80 U/L (ref 38–126)
Anion gap: 8 (ref 5–15)
BUN: <5 mg/dL — ABNORMAL LOW (ref 6–20)
CO2: 24 mmol/L (ref 22–32)
Calcium: 8.6 mg/dL — ABNORMAL LOW (ref 8.9–10.3)
Chloride: 103 mmol/L (ref 98–111)
Creatinine, Ser: 0.65 mg/dL (ref 0.61–1.24)
GFR, Estimated: >60 mL/min (ref >60)
Glucose, Bld: 104 mg/dL — ABNORMAL HIGH (ref 70–99)
Potassium: 3.5 mmol/L (ref 3.5–5.1)
Sodium: 135 mmol/L (ref 135–145)
Total Bilirubin: 0.7 mg/dL (ref 0.3–1.2)
Total Protein: 6.4 g/dL — ABNORMAL LOW (ref 6.5–8.1)

## 2020-10-30 LAB — PROTIME-INR
INR: 1.1 (ref 0.8–1.2)
Prothrombin Time: 14.5 seconds (ref 11.4–15.2)

## 2020-10-30 LAB — URINALYSIS, ROUTINE W REFLEX MICROSCOPIC
Bilirubin Urine: NEGATIVE
Glucose, UA: NEGATIVE mg/dL
Hgb urine dipstick: NEGATIVE
Ketones, ur: NEGATIVE mg/dL
Leukocytes,Ua: NEGATIVE
Nitrite: NEGATIVE
Protein, ur: NEGATIVE mg/dL
Specific Gravity, Urine: 1.004 — ABNORMAL LOW (ref 1.005–1.030)
pH: 8 (ref 5.0–8.0)

## 2020-10-30 LAB — LACTIC ACID, PLASMA: Lactic Acid, Venous: 1.5 mmol/L (ref 0.5–1.9)

## 2020-10-30 LAB — SARS CORONAVIRUS 2 (TAT 6-24 HRS): SARS Coronavirus 2: NEGATIVE

## 2020-10-30 LAB — MAGNESIUM: Magnesium: 1.2 mg/dL — ABNORMAL LOW (ref 1.7–2.4)

## 2020-10-30 LAB — ETHANOL: Alcohol, Ethyl (B): <10 mg/dL (ref <10)

## 2020-10-30 MED ORDER — NICOTINE 21 MG/24HR TD PT24
21.0000 mg | MEDICATED_PATCH | Freq: Every day | TRANSDERMAL | Status: DC
  Administered 2020-10-31: 21 mg via TRANSDERMAL
  Filled 2020-10-30 (×2): qty 1

## 2020-10-30 MED ORDER — BOOST / RESOURCE BREEZE PO LIQD CUSTOM
1.0000 | Freq: Three times a day (TID) | ORAL | Status: DC
  Administered 2020-10-30 – 2020-10-31 (×4): 1 via ORAL

## 2020-10-30 MED ORDER — SODIUM CHLORIDE 0.9 % IV SOLN
1000.0000 mL | INTRAVENOUS | Status: DC
  Administered 2020-10-30: 1000 mL via INTRAVENOUS

## 2020-10-30 MED ORDER — PANCRELIPASE (LIP-PROT-AMYL) 36000-114000 UNITS PO CPEP
72000.0000 [IU] | ORAL_CAPSULE | Freq: Three times a day (TID) | ORAL | Status: DC
  Administered 2020-10-30 – 2020-10-31 (×3): 72000 [IU] via ORAL
  Filled 2020-10-30 (×3): qty 2

## 2020-10-30 MED ORDER — HYDROMORPHONE HCL 1 MG/ML IJ SOLN
1.0000 mg | Freq: Once | INTRAMUSCULAR | Status: AC
Start: 2020-10-30 — End: 2020-10-30
  Administered 2020-10-30: 1 mg via INTRAVENOUS
  Filled 2020-10-30: qty 1

## 2020-10-30 MED ORDER — THIAMINE HCL 100 MG/ML IJ SOLN
100.0000 mg | Freq: Every day | INTRAMUSCULAR | Status: DC
  Administered 2020-10-30: 100 mg via INTRAVENOUS
  Filled 2020-10-30: qty 2

## 2020-10-30 MED ORDER — ACETAMINOPHEN 325 MG PO TABS
650.0000 mg | ORAL_TABLET | Freq: Four times a day (QID) | ORAL | Status: DC | PRN
  Administered 2020-10-30: 650 mg via ORAL
  Filled 2020-10-30: qty 2

## 2020-10-30 MED ORDER — DULOXETINE HCL 30 MG PO CPEP
30.0000 mg | ORAL_CAPSULE | Freq: Every day | ORAL | Status: DC
  Filled 2020-10-30: qty 1

## 2020-10-30 MED ORDER — OXYCODONE-ACETAMINOPHEN 5-325 MG PO TABS: 1.0000 | ORAL_TABLET | ORAL | Status: DC | PRN

## 2020-10-30 MED ORDER — ACETAMINOPHEN 650 MG RE SUPP: 650.0000 mg | Freq: Three times a day (TID) | RECTAL | Status: DC

## 2020-10-30 MED ORDER — SODIUM CHLORIDE 0.9 % IV BOLUS (SEPSIS)
1000.0000 mL | Freq: Once | INTRAVENOUS | Status: AC
  Administered 2020-10-30: 1000 mL via INTRAVENOUS

## 2020-10-30 MED ORDER — ONDANSETRON HCL 4 MG PO TABS: 4.0000 mg | ORAL_TABLET | Freq: Four times a day (QID) | ORAL | Status: DC | PRN

## 2020-10-30 MED ORDER — FLUTICASONE FUROATE-VILANTEROL 200-25 MCG/INH IN AEPB
1.0000 | INHALATION_SPRAY | Freq: Every day | RESPIRATORY_TRACT | Status: DC
  Administered 2020-10-30 – 2020-10-31 (×2): 1 via RESPIRATORY_TRACT
  Filled 2020-10-30: qty 28

## 2020-10-30 MED ORDER — LIDOCAINE VISCOUS HCL 2 % MT SOLN
15.0000 mL | Freq: Four times a day (QID) | OROMUCOSAL | Status: DC | PRN
  Filled 2020-10-30: qty 15

## 2020-10-30 MED ORDER — VITAMIN D 25 MCG (1000 UNIT) PO TABS
1000.0000 [IU] | ORAL_TABLET | Freq: Every day | ORAL | Status: DC
  Administered 2020-10-30 – 2020-10-31 (×2): 1000 [IU] via ORAL
  Filled 2020-10-30 (×2): qty 1

## 2020-10-30 MED ORDER — HYDROMORPHONE HCL 1 MG/ML IJ SOLN: 0.5000 mg | INTRAMUSCULAR | Status: DC | PRN

## 2020-10-30 MED ORDER — ALBUTEROL SULFATE (2.5 MG/3ML) 0.083% IN NEBU: 2.5000 mg | INHALATION_SOLUTION | RESPIRATORY_TRACT | Status: DC | PRN

## 2020-10-30 MED ORDER — FOLIC ACID 5 MG/ML IJ SOLN
1.0000 mg | Freq: Every day | INTRAMUSCULAR | Status: DC
  Administered 2020-10-30 – 2020-10-31 (×2): 1 mg via INTRAVENOUS
  Filled 2020-10-30 (×3): qty 0.2

## 2020-10-30 MED ORDER — THIAMINE HCL 100 MG PO TABS
100.0000 mg | ORAL_TABLET | Freq: Every day | ORAL | Status: DC
  Administered 2020-10-31: 100 mg via ORAL
  Filled 2020-10-30: qty 1

## 2020-10-30 MED ORDER — ADULT MULTIVITAMIN W/MINERALS CH
1.0000 | ORAL_TABLET | Freq: Every day | ORAL | Status: DC
  Administered 2020-10-30 – 2020-10-31 (×2): 1 via ORAL
  Filled 2020-10-30 (×2): qty 1

## 2020-10-30 MED ORDER — PANTOPRAZOLE SODIUM 40 MG IV SOLR
40.0000 mg | INTRAVENOUS | Status: DC
  Administered 2020-10-30 – 2020-10-31 (×2): 40 mg via INTRAVENOUS
  Filled 2020-10-30 (×2): qty 40

## 2020-10-30 MED ORDER — OXYCODONE-ACETAMINOPHEN 5-325 MG PO TABS
1.0000 | ORAL_TABLET | Freq: Four times a day (QID) | ORAL | Status: DC | PRN
Start: 2020-10-30 — End: 2020-10-31
  Administered 2020-10-30 – 2020-10-31 (×5): 1 via ORAL
  Filled 2020-10-30 (×6): qty 1

## 2020-10-30 MED ORDER — ONDANSETRON HCL 4 MG/2ML IJ SOLN: 4.0000 mg | Freq: Four times a day (QID) | INTRAMUSCULAR | Status: DC | PRN

## 2020-10-30 MED ORDER — SUCRALFATE 1 G PO TABS
1.0000 g | ORAL_TABLET | Freq: Three times a day (TID) | ORAL | Status: DC
  Administered 2020-10-30 – 2020-10-31 (×4): 1 g via ORAL
  Filled 2020-10-30 (×4): qty 1

## 2020-10-30 MED ORDER — SERTRALINE HCL 100 MG PO TABS
100.0000 mg | ORAL_TABLET | Freq: Every day | ORAL | Status: DC
  Administered 2020-10-30 – 2020-10-31 (×2): 100 mg via ORAL
  Filled 2020-10-30 (×2): qty 1

## 2020-10-30 MED ORDER — ONDANSETRON HCL 4 MG/2ML IJ SOLN
4.0000 mg | Freq: Once | INTRAMUSCULAR | Status: AC
  Administered 2020-10-30: 4 mg via INTRAVENOUS
  Filled 2020-10-30: qty 2

## 2020-10-30 MED ORDER — ACETAMINOPHEN 500 MG PO TABS
1000.0000 mg | ORAL_TABLET | Freq: Three times a day (TID) | ORAL | Status: DC
  Administered 2020-10-30 – 2020-10-31 (×3): 1000 mg via ORAL
  Filled 2020-10-30 (×5): qty 2

## 2020-10-30 MED ORDER — ACETAMINOPHEN 650 MG RE SUPP: 650.0000 mg | Freq: Four times a day (QID) | RECTAL | Status: DC | PRN

## 2020-10-30 MED ORDER — PANCRELIPASE (LIP-PROT-AMYL) 36000-114000 UNITS PO CPEP: 36000.0000 [IU] | ORAL_CAPSULE | Freq: Three times a day (TID) | ORAL | Status: DC

## 2020-10-30 NOTE — Progress Notes (Signed)
Pt pain more controlled after orders were given for percocet late afternoon.  Pt walks the hallways frequently.  [name] asked to speak with MD when she was here. MD called her and answered her questions.

## 2020-10-30 NOTE — Progress Notes (Signed)
Pt reported a bloody stool. I assessed it in the toilet. It was a small, light brown BM with a few tiny bits that were dark and stringy. No blood-tinged water in the toilet.  Notified MD.

## 2020-10-30 NOTE — Progress Notes (Signed)
Subjective:  Overnight Events: No acute events overnight   Patient evaluated at the bedside. States he could not tolerate his abdominal pain at home. He describes the pain as stabbing in his lower abd and burning in his upper/epigastric area. He only had some fries, water and Gatorade at home but he became nauseous. He denies any vomiting. He reports being compliant with his creon and Carafate. He also took his pain medicine at home. He gets his pain meds from his PCP, [name] at [facility] and has a pain contract with her. Patient refused Cymbalta today.  After we left patients room he came outside and caught [place] before leaving unit. Patient said he needs more pain medication than he was prescribed by PCP. Patient asked to return to his room and told this was abnormal behavior. Reminded I will discuss plan with his PCP , so we have a long term plan to control his pain with chronic pancreatitis.     Objective:  Vital signs in last 24 hours: Vitals:   10/30/20 0807 10/30/20 0809 10/30/20 0835 10/30/20 1244  BP: 128/87  127/90 123/90  Pulse: 99  86 86  Resp: (!) 25   18  Temp:  98.1 F (36.7 C) 98.2 F (36.8 C) 98.3 F (36.8 C)  TempSrc:  Oral Oral Oral  SpO2: 99%  99% 100%   Supplemental O2: Room Air SpO2: 100 %   Physical Exam:   General: NAD, underweight middle age man Cardiovascular: Normal rate, regular rhythm.  No murmurs, rubs, or gallops Pulmonary : Effort normal, breath sounds normal. No wheezes, rales, or rhonchi Abdominal: soft, active bowel sounds, tender on deep palpation to left lower quadrant and epigastric region, no guarding  Musculoskeletal: no swelling , deformity, injury ,or tenderness in extremities  Assessment/Plan:   Principal Problem:   Pancreatitis Active Problems:   Essential hypertension   Gastritis and gastroduodenitis   Patient Summary: Hospital day 1 for [name] a 50 y.o. person living with chronic pancreatitis, alcohol  use disorder, gastritis, and internal hemorrhoids who was discharged and admitted on the same day for chronic pancreatitis.    #Chronic pancreatitis #Gastritis Patient is experiencing pain consistent with his chronic pancreatitis. Admitted to service and frequently requesting IV Dilaudid. Given patients history of addiction to alcohol, he is at risk for addiction to opioid pain medications. Plan for multimodal approach to pain and work with his outside provider on pain regimen. Discussed pain would not be completely taken away, but goal is to get his pain to a tolerable range.No obvious signs on imaging of acute flair, however patient has severe disease. Given history of alcohol use disorder order and gastritis patient is not a good candidate for NSAIDS. Will schedule tylenol and patient does not want to start duloxetine.  - Continue Creon for pancreatic insuffiencey, 72K units TID - At risk for fat soluble vitamin def: Check INR, Vitamin A, Vitamin E, Continue Vit D supplement - For gastritis: Sucralfate 1 g 3 times daily, Protonix 40 mg daily , Xylocaine as needed - Start Gabapentin for multimodal pain control - Tylenol 1000 mg TID for mild pain - On review of PDMP patient receiing 60 tabs every 15 days of oxycodone-acetaminophen 5-325, Continue oxycodone 5-325 4 times a day as needed for moderate to severe pain. Will reach out to provider at [facility] to discuss pain contract and opioid pain medications.   #Depression -Continue Zoloft 100 mg daily.  SSRI also benefits chronic pain with  chronic pancreatitis  #Leukocytosis -WBC 14, no fever. Continue to trend.   Diet:  Clear liquid IVF: None,None VTE: SCDs Code: Full   Dispo: Anticipated discharge to Home in 1 days pending improvement in pain.   Jason Snider, MD PGY2 Internal Medicine Pager: [phone] Please contact the on call pager after 5 pm and on weekends at [phone].

## 2020-10-30 NOTE — ED Provider Notes (Signed)
[facility] EMERGENCY DEPARTMENT Provider Note  CSN: [id number] Arrival date & time: 10/29/20 2153  Chief Complaint(s) Abdominal Pain  HPI [name] is a 50 y.o. male with a past medical history listed below who was discharged from the hospital earlier yesterday afternoon after being admitted for GI bleed.  Patient returns for persistent epigastric abdominal pain consistent with his pancreatitis.  Reports that the pain was there during the hospital but got worse.  Denies any recent alcohol use.  No illicit drug use.  Ports some nausea without emesis.  Reports that he still has some blood in the stool.  No fevers or chills.  No coughing or congestion.   Abdominal Pain  Past Medical History Past Medical History:  Diagnosis Date   Alcoholism /alcohol abuse    Anemia    Anxiety    Arthritis    "knees; arms; elbows" (03/26/2015)   Asthma    Bipolar disorder (HCC)    Chronic bronchitis (HCC)    Chronic lower back pain    Chronic pancreatitis ([place])    Cocaine abuse ([place])    Depression    Family history of adverse reaction to anesthesia    "grandmother gets confused"   Femoral condyle fracture ([place]) 03/08/2014   left medial/notes 03/09/2014   GERD (gastroesophageal reflux disease)    H/O hiatal hernia    H/O suicide attempt 10/2012   High cholesterol    History of blood transfusion 10/2012   "when I tried to commit suicide"   History of stomach ulcers    Hypertension    Marijuana abuse, continuous    Migraine    "a few times/year" (03/26/2015)   Pneumonia 1990's X 3   PTSD (post-traumatic stress disorder)    Sickle cell trait ([place])    WPW (Wolff-Parkinson-White syndrome)    [name] 03/06/2013   Patient Active Problem List   Diagnosis Date Noted   Pancreatitis 10/30/2020   Rectal ulcer    Hypomagnesemia    Hypocalcemia 10/23/2020   AKI (acute kidney injury) ([place]) 11/13/2018   Seizure ([place]) 11/13/2018   Gastritis and gastroduodenitis     Nonspecific chest pain 01/08/2018   GI bleed 11/24/2017   Acute blood loss anemia 11/24/2017   Atypical chest pain 11/24/2017   Acute pancreatitis 09/28/2017   Abdominal pain 05/27/2017   Hematemesis 05/27/2017   Tachycardia 03/18/2017   Diarrhea 03/18/2017   Acute on chronic pancreatitis ([place]) 12/17/2016   Chronic alcoholic pancreatitis ([place]) 12/05/2016   Intractable nausea and vomiting 12/05/2016   Verbally abusive behavior 12/05/2016   Normocytic anemia 12/05/2016   Alcohol use disorder, severe, dependence ([place]) 07/25/2016   Cocaine use disorder, severe, dependence ([place]) 07/25/2016   Major depressive disorder, 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   Leukocytosis    Hospital acquired PNA 05/20/2015   Chronic pancreatitis ([place]) 05/18/2015   Pseudocyst of pancreas 05/18/2015   Polysubstance abuse (tobacco, cocaine, THC, and ETOH) 03/26/2015   Alcohol-induced chronic pancreatitis ([place])    Essential hypertension 02/06/2014   Mood disorder ([place]) 02/06/2014   Alcohol-induced acute pancreatitis 11/28/2013   Pancreatic pseudocyst/cyst 11/25/2013   Severe protein-calorie malnutrition ([place]) 10/10/2013   Suicide attempt ([place]) 10/08/2013   [name] Parkinson White pattern seen on electrocardiogram 10/03/2012   TOBACCO ABUSE 03/23/2007   Home Medication(s) Prior to Admission medications   Medication Sig Start Date End Date Taking? Authorizing Provider  albuterol (VENTOLIN HFA) 108 (90 Base) MCG/ACT inhaler Inhale 2 puffs into the lungs every 4 (four)  hours as needed for wheezing or shortness of breath. 11/12/19   [name], DO  cholecalciferol (VITAMIN D) 25 MCG tablet Take 1 tablet (1,000 Units total) by mouth daily. 10/30/20   [name], MD  cyclobenzaprine (FLEXERIL) 10 MG tablet Take 1 tablet by mouth 3 (three) times daily as needed for muscle spasms. 10/20/20   [provider]  fluticasone furoate-vilanterol (BREO ELLIPTA) 200-25 MCG/INH AEPB Inhale 1  puff into the lungs daily. 11/12/19   [name], DO  folic acid (FOLVITE) 1 MG tablet Take 1 tablet (1 mg total) by mouth daily. 11/26/17   [name], MD  lidocaine (XYLOCAINE) 2 % solution Use as directed 15 mLs in the mouth or throat every 6 (six) hours as needed for mouth pain. 05/25/20   [name], MD  lipase/protease/amylase (CREON) 36000 UNITS CPEP capsule Take 2 capsules (72,000 Units total) by mouth 3 (three) times daily with meals. For pancreatitis 11/05/19   [name], [name], MD  Multiple Vitamin (MULTIVITAMIN WITH MINERALS) TABS tablet Take 1 tablet by mouth daily. 09/06/17   Bonnell Public, MD  ondansetron (ZOFRAN-ODT) 4 MG disintegrating tablet Take 4 mg by mouth 2 (two) times daily as needed for vomiting or nausea. 10/20/20   [provider]  oxyCODONE-acetaminophen (PERCOCET/ROXICET) 5-325 MG tablet Take 1 tablet by mouth 4 (four) times daily as needed for moderate pain or severe pain. 10/20/20   [provider]  pantoprazole (PROTONIX) 40 MG tablet Take 1 tablet (40 mg total) by mouth 2 (two) times daily. 10/29/20   [name], MD  sertraline (ZOLOFT) 100 MG tablet Take 1 tablet (100 mg total) by mouth daily. 03/27/20   [name], MD  sucralfate (CARAFATE) 1 g tablet Take 1 tablet (1 g total) by mouth 4 (four) times daily -  with meals and at bedtime. 05/23/20   Kerin Perna, NP  thiamine 100 MG tablet Take 1 tablet (100 mg total) by mouth daily. 10/16/18   [name], MD  amitriptyline (ELAVIL) 25 MG tablet Take 1 tablet (25 mg total) by mouth at bedtime. Patient not taking: Reported on 08/08/2019 10/15/18 08/08/19  [name], MD  gabapentin (NEURONTIN) 100 MG capsule Take 1 capsule (100 mg total) by mouth 2 (two) times daily. Patient not taking: Reported on 08/08/2019 10/15/18 08/08/19  [name], MD  promethazine (PHENERGAN) 25 MG tablet Take 1 tablet (25 mg total) by mouth every 6 (six) hours as needed for nausea or  vomiting. Patient not taking: Reported on 08/08/2019 03/02/19 08/08/19  Arlean Hopping                                                                                                                                    Past Surgical History Past Surgical History:  Procedure Laterality Date   BIOPSY  11/25/2017   Procedure: BIOPSY;  Surgeon: Arta Silence, MD;  Location: [place] [facility];  Service: Endoscopy;;   BIOPSY  10/14/2018   Procedure: BIOPSY;  Surgeon: Arta Silence, MD;  Location: [place];  Service: Endoscopy;;   CARDIAC CATHETERIZATION     CYST ENTEROSTOMY  01/02/2020   Procedure: CYST ASPIRATION;  Surgeon: Milus Banister, MD;  Location: [facility];  Service: Endoscopy;;   ESOPHAGOGASTRODUODENOSCOPY (EGD) WITH PROPOFOL N/A 11/25/2017   Procedure: ESOPHAGOGASTRODUODENOSCOPY (EGD) WITH PROPOFOL;  Surgeon: Arta Silence, MD;  Location: [place];  Service: Endoscopy;  Laterality: N/A;   ESOPHAGOGASTRODUODENOSCOPY (EGD) WITH PROPOFOL Left 10/14/2018   Procedure: ESOPHAGOGASTRODUODENOSCOPY (EGD) WITH PROPOFOL;  Surgeon: Arta Silence, MD;  Location: [facility];  Service: Endoscopy;  Laterality: Left;   ESOPHAGOGASTRODUODENOSCOPY (EGD) WITH PROPOFOL N/A 11/14/2018   Procedure: ESOPHAGOGASTRODUODENOSCOPY (EGD) WITH PROPOFOL;  Surgeon: Laurence Spates, MD;  Location: [facility];  Service: Gastroenterology;  Laterality: N/A;   ESOPHAGOGASTRODUODENOSCOPY (EGD) WITH PROPOFOL N/A 01/02/2020   Procedure: ESOPHAGOGASTRODUODENOSCOPY (EGD) WITH PROPOFOL;  Surgeon: Milus Banister, MD;  Location: [facility];  Service: Endoscopy;  Laterality: N/A;   ESOPHAGOGASTRODUODENOSCOPY (EGD) WITH PROPOFOL N/A 10/25/2020   Procedure: ESOPHAGOGASTRODUODENOSCOPY (EGD) WITH PROPOFOL;  Surgeon: Rush Landmark Telford Nab., MD;  Location: [place];  Service: Gastroenterology;  Laterality: N/A;   EUS N/A 01/02/2020   Procedure: UPPER ENDOSCOPIC ULTRASOUND (EUS) RADIAL;  Surgeon: Milus Banister, MD;   Location: [facility];  Service: Endoscopy;  Laterality: N/A;   EYE SURGERY Left 1990's   "result of trauma"    FACIAL FRACTURE SURGERY Left 1990's   "result of trauma"    FLEXIBLE SIGMOIDOSCOPY N/A 10/25/2020   Procedure: FLEXIBLE SIGMOIDOSCOPY;  Surgeon: Irving Copas., MD;  Location: [place];  Service: Gastroenterology;  Laterality: N/A;   FRACTURE SURGERY     HEMOSTASIS CLIP PLACEMENT  10/25/2020   Procedure: HEMOSTASIS CLIP PLACEMENT;  Surgeon: Irving Copas., MD;  Location: [place];  Service: Gastroenterology;;   HERNIA REPAIR     LEFT HEART CATHETERIZATION WITH CORONARY ANGIOGRAM Right 03/07/2013   Procedure: LEFT HEART CATHETERIZATION WITH CORONARY ANGIOGRAM;  Surgeon: [name], MD;  Location: [place] CATH LAB;  Service: Cardiovascular;  Laterality: Right;   UMBILICAL HERNIA REPAIR     UPPER GASTROINTESTINAL ENDOSCOPY     Family History Family History  Problem Relation Age of Onset   Hypertension Mother    Cirrhosis Father    Alcoholism Father    Hypertension Father    Melanoma Father    Hypertension Other    Coronary artery disease Other     Social History Social History   Tobacco Use   Smoking status: Every Day    Packs/day: 1.00    Years: 36.00    Pack years: 36.00    Types: Cigarettes, E-cigarettes   Smokeless tobacco: Never  Vaping Use   Vaping Use: Former  Substance Use Topics   Alcohol use: Yes    Comment: last drink 8 days ago   Drug use: Yes    Types: Marijuana, Cocaine    Comment: daily marijuana use; last cocaine use about 3 months ago   Allergies Robaxin [methocarbamol], Shellfish-derived products, Trazodone and nefazodone, Adhesive [tape], Latex, Toradol [ketorolac tromethamine], Contrast media [iodinated diagnostic agents], and Reglan [metoclopramide]  Review of Systems Review of Systems  Gastrointestinal:  Positive for abdominal pain.  All other systems are reviewed and are negative for acute change except as  noted in the HPI  Physical Exam Vital Signs  I have reviewed the triage vital signs BP (!) 152/100   Pulse [name])  108   Temp 99.6 F (37.6 C) (Oral)   Resp 20   SpO2 99%   Physical Exam Vitals reviewed.  Constitutional:      General: He is not in acute distress.    Appearance: He is well-developed. He is not diaphoretic.  HENT:     Head: Normocephalic and atraumatic.     Right Ear: External ear normal.     Left Ear: External ear normal.     Nose: Nose normal.     Mouth/Throat:     Mouth: Mucous membranes are moist.  Eyes:     General: No scleral icterus.    Conjunctiva/sclera: Conjunctivae normal.  Neck:     Trachea: Phonation normal.  Cardiovascular:     Rate and Rhythm: Normal rate and regular rhythm.  Pulmonary:     Effort: Pulmonary effort is normal. No respiratory distress.     Breath sounds: No stridor.  Abdominal:     General: There is no distension.     Tenderness: There is abdominal tenderness in the epigastric area and left upper quadrant. There is no guarding or rebound.  Musculoskeletal:        General: Normal range of motion.     Cervical back: Normal range of motion.  Neurological:     Mental Status: He is alert and oriented to person, place, and time.  Psychiatric:        Behavior: Behavior normal.    ED Results and Treatments Labs (all labs ordered are listed, but only abnormal results are displayed) Labs Reviewed  COMPREHENSIVE METABOLIC PANEL - Abnormal; Notable for the following components:      Result Value   Sodium 133 (*)    Glucose, Bld 116 (*)    BUN <5 (*)    All other components within normal limits  CBC WITH DIFFERENTIAL/PLATELET - Abnormal; Notable for the following components:   WBC 14.8 (*)    RBC 2.99 (*)    Hemoglobin 9.8 (*)    HCT 30.2 (*)    MCV 101.0 (*)    RDW 15.7 (*)    Neutro Abs 11.6 (*)    Monocytes Absolute 1.8 (*)    All other components within normal limits  URINALYSIS, ROUTINE W REFLEX MICROSCOPIC - Abnormal;  Notable for the following components:   Color, Urine STRAW (*)    Specific Gravity, Urine 1.004 (*)    All other components within normal limits  LIPASE, BLOOD  ETHANOL  RAPID URINE DRUG SCREEN, HOSP PERFORMED  LACTIC ACID, PLASMA  MAGNESIUM  PHOSPHORUS  COMPREHENSIVE METABOLIC PANEL  CBC                                                                                                                         EKG  EKG Interpretation  Date/Time:    Ventricular Rate:    PR Interval:    QRS Duration:   QT Interval:    QTC Calculation:   R Axis:  Text Interpretation:          Radiology CT ABDOMEN PELVIS WO CONTRAST  Result Date: 10/30/2020 CLINICAL DATA:  Nonlocalized acute abdominal pain. EXAM: CT ABDOMEN AND PELVIS WITHOUT CONTRAST TECHNIQUE: Multidetector CT imaging of the abdomen and pelvis was performed following the standard protocol without IV contrast. COMPARISON:  MRI abdomen 11/22/2019, CT abdomen pelvis 10/23/2020 FINDINGS: Lower chest: Unremarkable. Hepatobiliary: No focal liver abnormality. No gallstones, gallbladder wall thickening, or pericholecystic fluid. No biliary dilatation. Pancreas: Interval development of proximal pancreatic parenchyma hazy contour and fat stranding. Similar-appearing pancreatic tail 4.2 x 4.1 cm cystic lesion as well as several smaller lesions distally consistent with a combination of foci of duct dilatation intraparenchymal pseudocysts that are better evaluated on prior MRI in 2021. Lung bronchus eczema pancreas there is redemonstration of coarse calcifications as well as similar-appearing cystic lesions. There is a region of both cystic and calcifications within the pancreatic uncinate process that appears similar compared to 10/23/2020 (3: 26-31). Spleen: Normal in size without focal abnormality. Adrenals/Urinary Tract: No adrenal nodule bilaterally. No nephrolithiasis, no hydronephrosis, and no contour-deforming renal mass. No ureterolithiasis  or hydroureter. The urinary bladder is unremarkable. Stomach/Bowel: Gastric underdistention. Otherwise stomach is within normal limits. No evidence of bowel wall thickening or dilatation. Appendix appears normal. Vascular/Lymphatic: No abdominal aorta or iliac aneurysm. At least moderate atherosclerotic plaque of the aorta and its branches. No abdominal, pelvic, or inguinal lymphadenopathy. Reproductive: Prostate is unremarkable. Other: No intraperitoneal free fluid. No intraperitoneal free gas. No organized fluid collection. Musculoskeletal: No abdominal wall hernia or abnormality. No suspicious lytic or blastic osseous lesions. No acute displaced fracture. Degenerative changes of the L5-S1 level. IMPRESSION: 1. Acute on chronic pancreatitis with similar appearance of known regions of pancreatic duct dilatation/pseudocysts with the largest measuring 4.2 x 4.1 cm along the tail of the pancreas. 2.  Aortic Atherosclerosis (ICD10-I70.0). Electronically Signed   By: Iven Finn M.D.   On: 10/30/2020 03:38    Pertinent labs & imaging results that were available during my care of the patient were reviewed by me and considered in my medical decision making (see chart for details).  Medications Ordered in ED Medications  sodium chloride 0.9 % bolus 1,000 mL (0 mLs Intravenous Stopped 10/30/20 0435)    Followed by  0.9 %  sodium chloride infusion (1,000 mLs Intravenous New Bag/Given 10/30/20 0528)  acetaminophen (TYLENOL) tablet 650 mg (has no administration in time range)    Or  acetaminophen (TYLENOL) suppository 650 mg (has no administration in time range)  ondansetron (ZOFRAN) tablet 4 mg (has no administration in time range)    Or  ondansetron (ZOFRAN) injection 4 mg (has no administration in time range)  folic acid injection 1 mg (has no administration in time range)  thiamine (B-1) injection 100 mg (has no administration in time range)  pantoprazole (PROTONIX) injection 40 mg (has no administration  in time range)  HYDROmorphone (DILAUDID) injection 0.5 mg (has no administration in time range)  nicotine (NICODERM CQ - dosed in mg/24 hours) patch 21 mg (has no administration in time range)  ondansetron (ZOFRAN) injection 4 mg (4 mg Intravenous Given 10/30/20 0346)  HYDROmorphone (DILAUDID) injection 1 mg (1 mg Intravenous Given 10/30/20 0445)  Procedures Procedures  (including critical care time)  Medical Decision Making / ED Course I have reviewed the nursing notes for this encounter and the patient's prior records (if available in EHR or on provided paperwork).   Benigno Caton Popowski was evaluated in Emergency Department on 10/30/2020 for the symptoms described in the history of present illness. He was evaluated in the context of the global COVID-19 pandemic, which necessitated consideration that the patient might be at risk for infection with the SARS-CoV-2 virus that causes COVID-19. Institutional protocols and algorithms that pertain to the evaluation of patients at risk for COVID-19 are in a state of rapid change based on information released by regulatory bodies including the CDC and federal and state organizations. These policies and algorithms were followed during the patient's care in the ED.  Work-up is notable for leukocytosis which is increased from earlier in the day just prior to discharge.  CT scan obtained in notable for acute on chronic pancreatitis.  Patient noted to be tachycardic and with a low-grade temp.  No other infectious symptoms or source.  UA without infection.  Provided with IV fluids and pain medicine.  Admitted to medicine for continued management.      Final Clinical Impression(s) / ED Diagnoses Final diagnoses:  Acute on chronic pancreatitis [facility])      This chart was dictated using voice recognition software.  Despite best  efforts to proofread,  errors can occur which can change the documentation meaning.    Fatima Blank, MD 10/30/20 [phone]

## 2020-10-30 NOTE — H&P (Signed)
Date: 10/30/2020               Patient Name:  [name] MRN: [id number]  DOB: [birth year] Age / Sex: 50 y.o., male   PCP: Simona Huh, NP         Medical Service: Internal Medicine Teaching Service         Attending Physician: Dr. Lucious Groves, DO    First Contact: Dr. Rosita Kea  Pager: [phone]  Second Contact: Dr. Court Joy Pager: [phone]       After Hours (After 5p/  First Contact Pager: [phone]  weekends / holidays): Second Contact Pager: [phone]   Chief Complaint: Abdominal pain   History of Present Illness:  [name] is a 50 year old gentleman with past medical history of chronic pancreatitis, alcohol use disorder, gastritis, internal hemorrhoids, hypertension, who presented to the ED for worsening abdominal pain.  He was recently admitted for hypoglycemia, and found to have chronic pancreatitis with gastritis and internal hemorrhoids.  Patient was discharged yesterday afternoon.  States that he lives with his mother.  After he went home, he ate some fries and drank Gatorade and experienced worsening abdominal pain.  Rated 9/10.  Now on 6/10.  Denies nausea or vomiting.  Denies chest pain, shortness of breath, fever or dysuria.  Endorses chills.  Patient reports 2 BM that is maroon color.  Patient denies any alcohol or illicit drugs you after leaving the hospital.  He did not pick up any medications after discharge.  States that he has an appointment with [place] for his pain clinic and will establish PCP there.  See that he will go to [facility] for rehab.  In the ED, CBC showed leukocytosis of 14.8.  Hemoglobin stable at 9.8, MCV elevated 101.0.  CMP unremarkable.  Lactic acid 1.5.  CT abdomen pelvis were obtained which show acute on chronic pancreatitis with stable pancreatic duct dilation/pseudocyst.   Meds:  No outpatient medications have been marked as taking for the 10/29/20 encounter [facility] Encounter).      Allergies: Allergies as of 10/29/2020 - Review Complete 10/29/2020  Allergen Reaction Noted   Robaxin [methocarbamol] Other (See Comments) 07/20/2016   Shellfish-derived products Nausea And Vomiting 08/18/2010   Trazodone and nefazodone Other (See Comments) 09/28/2011   Adhesive [tape] Itching 03/17/2018   Latex Itching 03/17/2018   Toradol [ketorolac tromethamine] Other (See Comments) 03/17/2018   Contrast media [iodinated diagnostic agents] Hives 12/04/2016   Reglan [metoclopramide] Other (See Comments) 11/18/2015   Past Medical History:  Diagnosis Date   Alcoholism /alcohol abuse    Anemia    Anxiety    Arthritis    "knees; arms; elbows" (03/26/2015)   Asthma    Bipolar disorder (HCC)    Chronic bronchitis (HCC)    Chronic lower back pain    Chronic pancreatitis ([place])    Cocaine abuse ([place])    Depression    Family history of adverse reaction to anesthesia    "grandmother gets confused"   Femoral condyle fracture ([place]) 03/08/2014   left medial/notes 03/09/2014   GERD (gastroesophageal reflux disease)    H/O hiatal hernia    H/O suicide attempt 10/2012   High cholesterol    History of blood transfusion 10/2012   "when I tried to commit suicide"   History of stomach ulcers    Hypertension    Marijuana abuse, continuous    Migraine    "a few times/year" (03/26/2015)   Pneumonia 1990's X  3   PTSD (post-traumatic stress disorder)    Sickle cell trait (HCC)    WPW (Wolff-Parkinson-White syndrome)    [name] 03/06/2013    Family History:  Family History  Problem Relation Age of Onset   Hypertension Mother    Cirrhosis Father    Alcoholism Father    Hypertension Father    Melanoma Father    Hypertension Other    Coronary artery disease Other      Social History:  Lives with his mother History of heavy alcohol use.  Has not had any alcohol since last admission. Smoke 12 cigarettes a day since 50 years old Denies any illicit drug use  Review of  Systems: A complete ROS was negative except as per HPI.   Physical Exam: Blood pressure (!) 152/100, pulse (!) 108, temperature 99.6 F (37.6 C), temperature source Oral, resp. rate 20, SpO2 99 %. Physical Exam Constitutional:      General: He is not in acute distress.    Appearance: He is not ill-appearing or toxic-appearing.  HENT:     Head: Normocephalic and atraumatic.  Eyes:     General: No scleral icterus.       Right eye: No discharge.        Left eye: No discharge.     Conjunctiva/sclera: Conjunctivae normal.  Cardiovascular:     Rate and Rhythm: Normal rate and regular rhythm.     Heart sounds: Normal heart sounds. No murmur heard.    Comments: No LE edema Pulmonary:     Effort: Pulmonary effort is normal. No respiratory distress.     Breath sounds: Normal breath sounds. No wheezing or rales.  Abdominal:     General: Bowel sounds are normal. There is no distension.     Palpations: Abdomen is soft. There is no mass.     Tenderness: There is no guarding.     Hernia: No hernia is present.     Comments: Abdomen is soft, nondistended.  Mild epigastric pain to palpation, no guarding.  Negative Murphy sign.  Musculoskeletal:        General: Normal range of motion.     Cervical back: Normal range of motion.  Skin:    General: Skin is warm.     Coloration: Skin is not jaundiced.  Neurological:     Mental Status: He is alert and oriented to person, place, and time. Mental status is at baseline.  Psychiatric:        Mood and Affect: Mood normal.        Thought Content: Thought content normal.        Judgment: Judgment normal.    CT abdomen/pelvis IMPRESSION: 1. Acute on chronic pancreatitis with similar appearance of known regions of pancreatic duct dilatation/pseudocysts with the largest measuring 4.2 x 4.1 cm along the tail of the pancreas. 2.  Aortic Atherosclerosis (ICD10-I70.0).  Assessment & Plan by Problem: Principal Problem:   Pancreatitis Active Problems:    Essential hypertension   Gastritis and gastroduodenitis  [name] is a 50 year old gentleman with past medical history of chronic pancreatitis, alcohol use disorder, gastritis, internal hemorrhoids, hypertension, who presented to the ED for worsening abdominal pain, found to have acute on chronic pancreatitis on CT abdomen/pelvis.  Acute on chronic pancreatitis Patient has chronic calcific pancreatitis secondary to heavy alcohol use history.  Patient denies alcohol use after discharge yesterday.  Will make patient n.p.o., continue IV fluid and pain control. -N.p.o. -NS 125 cc/h -IV Dilaudid 0.5 mg every  4 hours as needed  Gastritis His abdominal pain can also be a flare from his gastritis found on EGD.  Will start PPI.  -IV Protonix  Rectal ulcer Internal hemorrhoids Patient reports improvement color stool.  Last colonoscopy showed a rectal ulcer and internal hemorrhoids.  Hemoglobin stable, no sign of active bleeding at this time. -Can resume bowel regimen after he can tolerate p.o. -Monitor CBC  Alcohol use disorder Last drink was 9 days ago. -CIWA with Ativan -Folic acid and thiamine  Macrocytic anemia B12 and folate within normal limits last admission.  Patient also has iron deficiency anemia with ferritin of 15. -Can start iron supplement after discharge  Hypertension Can be elevated in the setting of pain. -Monitor  Full code IV fluid: Normal saline Diet: N.p.o. DVT: SCD  Dispo: Admit patient to Observation with expected length of stay less than 2 midnights.  Signed: Gaylan Gerold, DO 10/30/2020, 6:10 AM  Pager: [phone] After 5pm on weekdays and 1pm on weekends: On Call pager: [phone]

## 2020-10-30 NOTE — Progress Notes (Addendum)
Initial Nutrition Assessment  DOCUMENTATION CODES:   Severe malnutrition in context of chronic illness  INTERVENTION:   Check vitamin A, vitamin D, and vitamin C given severe malnutrition and history of chronic pancreatitis.   Boost Breeze po TID, each supplement provides 250 kcal and 9 grams of protein until diet advanced past clears.   Once diet advanced: Ensure Enlive po BID, each supplement provides 350 kcal and 20 grams of protein MVI daily   NUTRITION DIAGNOSIS:   Severe Malnutrition related to chronic illness (pancreatitis) as evidenced by severe fat depletion, severe muscle depletion, percent weight loss.  GOAL:   Patient will meet greater than or equal to 90% of their needs  MONITOR:   PO intake, Supplement acceptance, Weight trends, Labs, I & O's, Diet advancement  REASON FOR ASSESSMENT:   Consult Assessment of nutrition requirement/status  ASSESSMENT:   Patient with PMH significant for ETOH use, bipolar disorder, chronic bronchitis, chronic pancreatitis, GERD, HTN, high cholesterol, and WPW. Presents this admission with acute on chronic pancreatitis and gastritis.  Patient denies decreased intake PTA. Typically eats two meals daily that consist of L- hamburger, greens, pinto beans and D- chicken, rice, vegetables. Reports drinking beer with lemon PTA (unable to disclose amount). States he understands which foods affect his pancreatitis such as chocolate, coffee, fried chicken, alcohol, and butter. RD briefly reviewed foods that are higher in acidity. Discussed the importance of protein intake for preservation of lean body mass. Patient states he likes Ensure and it does not affect him negatively.   Patient reports a UBW of 145 lb and a recent weight loss of 25 lb over the last two years. Records indicate patient weighed 143 lb on 03/10/20 and 124 lb on 10/28/20 (13.2% wt loss in 7 months, significant for time frame).   Medications: folic acid, creon 09628 units with  meals, 100 mg thiamine  Labs: CBG 104-180  NUTRITION - FOCUSED PHYSICAL EXAM:  Flowsheet Row Most Recent Value  Orbital Region Moderate depletion  Upper Arm Region Moderate depletion  Thoracic and Lumbar Region Severe depletion  Buccal Region Severe depletion  Temple Region Severe depletion  Clavicle Bone Region Severe depletion  Clavicle and Acromion Bone Region Severe depletion  Scapular Bone Region Severe depletion  Dorsal Hand Moderate depletion  Patellar Region Moderate depletion  Anterior Thigh Region Moderate depletion  Posterior Calf Region Moderate depletion  Edema (RD Assessment) None  Hair Reviewed  Eyes Reviewed  Mouth Reviewed  Skin Reviewed  Nails Reviewed      Diet Order:   Diet Order             Diet clear liquid Room service appropriate? Yes; Fluid consistency: Thin  Diet effective now                   EDUCATION NEEDS:   Not appropriate for education at this time  Skin:  Skin Assessment: Reviewed RN Assessment  Last BM:  6/23  Height:   Ht Readings from Last 1 Encounters:  10/24/20 (P) 5\' 8"  (1.727 m)    Weight:   Wt Readings from Last 1 Encounters:  10/28/20 56.2 kg    BMI:  There is no height or weight on file to calculate BMI.  Estimated Nutritional Needs:   Kcal:  1700-1900 kcal  Protein:  85-100 grams  Fluid:  >/= 1.7 L/day  Mariana Single MS, RD, LDN, CNSC Clinical Nutrition Pager listed in [place]

## 2020-10-31 MED ORDER — FOLIC ACID 1 MG PO TABS: 1.0000 mg | ORAL_TABLET | Freq: Every day | ORAL | Status: DC

## 2020-10-31 MED ORDER — OXYCODONE-ACETAMINOPHEN 5-325 MG PO TABS: 1.0000 | ORAL_TABLET | Freq: Four times a day (QID) | ORAL | 0 refills | Status: AC | PRN

## 2020-10-31 NOTE — Discharge Summary (Signed)
Name: [name] MRN: [id number] DOB: Jan 15, 1971 50 y.o. PCP: Simona Huh, NP  Date of Admission: 10/29/2020 10:05 PM Date of Discharge: 10/31/2020 Attending Physician: Joni Reining C  Discharge Diagnosis: 1.  Chronic pancreatitis 2. Gastritis  Discharge Medications: Allergies as of 10/31/2020       Reactions   Robaxin [methocarbamol] Other (See Comments)   "jumpy limbs"   Aspirin    Other reaction(s): Other (See Comments)   Shellfish-derived Products Nausea And Vomiting   Trazodone And Nefazodone Other (See Comments)   Muscle spasms   Adhesive [tape] Itching   Latex Itching   Toradol [ketorolac Tromethamine] Other (See Comments)   Has ulcers; cannot have this   Contrast Media [iodinated Diagnostic Agents] Hives   Reglan [metoclopramide] Other (See Comments)   Muscle spasms        Medication List     STOP taking these medications    pantoprazole 40 MG tablet Commonly known as: PROTONIX       TAKE these medications    acetaminophen 500 MG tablet Commonly known as: TYLENOL Take 1,000 mg by mouth every 6 (six) hours as needed for moderate pain or headache.   albuterol 108 (90 Base) MCG/ACT inhaler Commonly known as: VENTOLIN HFA Inhale 2 puffs into the lungs every 4 (four) hours as needed for wheezing or shortness of breath.   Breo Ellipta 200-25 MCG/INH Aepb Generic drug: fluticasone furoate-vilanterol Inhale 1 puff into the lungs daily.   folic acid 1 MG tablet Commonly known as: FOLVITE Take 1 tablet (1 mg total) by mouth daily.   lidocaine 2 % solution Commonly known as: XYLOCAINE Use as directed 15 mLs in the mouth or throat every 6 (six) hours as needed for mouth pain.   lipase/protease/amylase 36000 UNITS Cpep capsule Commonly known as: CREON Take 2 capsules (72,000 Units total) by mouth 3 (three) times daily with meals. For pancreatitis   metoprolol 200 MG 24 hr tablet Commonly known as: TOPROL-XL Take 200 mg by mouth  daily.   multivitamin with minerals Tabs tablet Take 1 tablet by mouth daily.   omeprazole 20 MG capsule Commonly known as: PRILOSEC Take 20 mg by mouth daily.   ondansetron 4 MG disintegrating tablet Commonly known as: ZOFRAN-ODT Take 4 mg by mouth 2 (two) times daily as needed for vomiting or nausea.   oxyCODONE-acetaminophen 5-325 MG tablet Commonly known as: PERCOCET/ROXICET Take 1 tablet by mouth 4 (four) times daily as needed for up to 5 days for moderate pain or severe pain.   sertraline 100 MG tablet Commonly known as: ZOLOFT Take 1 tablet (100 mg total) by mouth daily.   sucralfate 1 g tablet Commonly known as: Carafate Take 1 tablet (1 g total) by mouth 4 (four) times daily -  with meals and at bedtime.   thiamine 100 MG tablet Take 1 tablet (100 mg total) by mouth daily.   Vitamin D3 25 MCG tablet Commonly known as: Vitamin D Take 1 tablet (1,000 Units total) by mouth daily.        Disposition and follow-up:   [name] was discharged from [facility] in Stable condition.  At the hospital follow up visit please address:  Chronic pancreatitis: Discussed multimodal pain control with patient.  He had an acute on chronic pancreatic flare and used extra doses of his oxycodone.  Left message with RN at [facility] for NP to contact me if needed. Patient given prescription for 5 days of oxycodone, and  told to discuss how to handle this in the future.    2.  Labs / imaging needed at time of follow-up: n/a  3.  Pending labs/ test needing follow-up: n/a  Follow-up Appointments:   Follow-up with PCP, Simona Huh, NP  Hospital Course by problem list: 1.  Patient readmitted at the recent discharge. He reported experiencing increased pain from his chronic pancreatitis.  CT abdomen not showing acute changes. Initially requesting IV Dilaudid.  We discussed continuing his home regimen of oxycodone, but given no signs of acute flare we  will not be escalating pain regimen.  Patient tolerating oral intake and pain controlled with home medications for gastritis, Creon, and oxycodone-acetaminophen 5-3 25 4  times daily.  Patient will follow-up with his primary care doctor at discharge.   Subjective: On day discharge patient resting comfortably in bed.  Reports his pain is controlled and tolerated oral intake overnight.  Discharge Exam:   BP 122/87 (BP Location: Left Arm)   Pulse (!) 102   Temp 98.7 F (37.1 C) (Oral)   Resp 18   SpO2 100%  Discharge exam:  General: NAD, underweight middle age man Cardiovascular: Normal rate, regular rhythm.  No murmurs, rubs, or gallops Pulmonary : Effort normal, breath sounds normal. No wheezes, rales, or rhonchi Abdominal: soft, active bowel sounds, NT/ND Musculoskeletal: no swelling , deformity, injury ,or tenderness in extremities  Pertinent Labs, Studies, and Procedures:  CBC Latest Ref Rng & Units 10/30/2020 10/29/2020 10/29/2020  WBC 4.0 - 10.5 K/uL 14.3(H) 14.8(H) 6.9  Hemoglobin 13.0 - 17.0 g/dL 9.4(L) 9.8(L) 8.9(L)  Hematocrit 39.0 - 52.0 % 29.3(L) 30.2(L) 27.6(L)  Platelets 150 - 400 K/uL 155 171 157   BMP Latest Ref Rng & Units 10/30/2020 10/29/2020 10/29/2020  Glucose 70 - 99 mg/dL 104(H) 116(H) 180(H)  BUN 6 - 20 mg/dL <5(L) <5(L) <5(L)  Creatinine 0.61 - 1.24 mg/dL 0.65 0.64 0.71  BUN/Creat Ratio 9 - 20 - - -  Sodium 135 - 145 mmol/L 135 133(L) 137  Potassium 3.5 - 5.1 mmol/L 3.5 3.5 3.6  Chloride 98 - 111 mmol/L 103 98 100  CO2 22 - 32 mmol/L 24 22 26   Calcium 8.9 - 10.3 mg/dL 8.6(L) 9.1 9.0   CT ABDOMEN PELVIS WO CONTRAST  Result Date: 10/30/2020 CLINICAL DATA:  Nonlocalized acute abdominal pain. EXAM: CT ABDOMEN AND PELVIS WITHOUT CONTRAST TECHNIQUE: Multidetector CT imaging of the abdomen and pelvis was performed following the standard protocol without IV contrast. COMPARISON:  MRI abdomen 11/22/2019, CT abdomen pelvis 10/23/2020 FINDINGS: Lower chest: Unremarkable.  Hepatobiliary: No focal liver abnormality. No gallstones, gallbladder wall thickening, or pericholecystic fluid. No biliary dilatation. Pancreas: Interval development of proximal pancreatic parenchyma hazy contour and fat stranding. Similar-appearing pancreatic tail 4.2 x 4.1 cm cystic lesion as well as several smaller lesions distally consistent with a combination of foci of duct dilatation intraparenchymal pseudocysts that are better evaluated on prior MRI in 2021. Lung bronchus eczema pancreas there is redemonstration of coarse calcifications as well as similar-appearing cystic lesions. There is a region of both cystic and calcifications within the pancreatic uncinate process that appears similar compared to 10/23/2020 (3: 26-31). Spleen: Normal in size without focal abnormality. Adrenals/Urinary Tract: No adrenal nodule bilaterally. No nephrolithiasis, no hydronephrosis, and no contour-deforming renal mass. No ureterolithiasis or hydroureter. The urinary bladder is unremarkable. Stomach/Bowel: Gastric underdistention. Otherwise stomach is within normal limits. No evidence of bowel wall thickening or dilatation. Appendix appears normal. Vascular/Lymphatic: No abdominal aorta or iliac aneurysm. At least  moderate atherosclerotic plaque of the aorta and its branches. No abdominal, pelvic, or inguinal lymphadenopathy. Reproductive: Prostate is unremarkable. Other: No intraperitoneal free fluid. No intraperitoneal free gas. No organized fluid collection. Musculoskeletal: No abdominal wall hernia or abnormality. No suspicious lytic or blastic osseous lesions. No acute displaced fracture. Degenerative changes of the L5-S1 level. IMPRESSION: 1. Acute on chronic pancreatitis with similar appearance of known regions of pancreatic duct dilatation/pseudocysts with the largest measuring 4.2 x 4.1 cm along the tail of the pancreas. 2.  Aortic Atherosclerosis (ICD10-I70.0). Electronically Signed   By: Iven Finn M.D.   On:  10/30/2020 03:38     Discharge Instructions: Discharge Instructions     Diet - low sodium heart healthy   Complete by: As directed    Diet - low sodium heart healthy   Complete by: As directed    Increase activity slowly   Complete by: As directed    Increase activity slowly   Complete by: As directed        Signed: [name], MD 10/31/2020, 6:41 PM   Pager: [phone]

## 2020-10-31 NOTE — Discharge Instructions (Signed)
Mr.Searight,  We have prescribed pain medication you use up with acute flair of your chronic pancreatitis. You will need to follow up with PCP at discharge. Continue to avoid alcohol and take medication as prescribed.   My best,  Merry Proud

## 2020-10-31 NOTE — Plan of Care (Signed)

## 2020-11-03 LAB — VITAMIN E
Vitamin E (Alpha Tocopherol): 6.6 mg/L — ABNORMAL LOW (ref 7.0–25.1)
Vitamin E(Gamma Tocopherol): 0.5 mg/L (ref 0.5–5.5)

## 2020-11-03 LAB — VITAMIN A: Vitamin A (Retinoic Acid): 27.5 ug/dL (ref 20.1–62.0)

## 2020-11-06 LAB — VITAMIN C: Vitamin C: 1.2 mg/dL (ref 0.4–2.0)

## 2020-11-09 ENCOUNTER — Emergency Department (HOSPITAL_COMMUNITY)
Admission: EM | Admit: 2020-11-09 | Discharge: 2020-11-09 | Disposition: A | Discharge status: Home / Self Care | Payer: 59 | Attending: Emergency Medicine | Admitting: Emergency Medicine

## 2020-11-09 ENCOUNTER — Other Ambulatory Visit: Payer: Self-pay

## 2020-11-09 ENCOUNTER — Emergency Department (HOSPITAL_COMMUNITY): Payer: 59

## 2020-11-09 ENCOUNTER — Encounter (HOSPITAL_COMMUNITY): Payer: Self-pay | Admitting: Emergency Medicine

## 2020-11-09 DIAGNOSIS — Z91040 Latex allergy status: Secondary | ICD-10-CM | POA: Diagnosis not present

## 2020-11-09 DIAGNOSIS — K921 Melena: Secondary | ICD-10-CM | POA: Diagnosis not present

## 2020-11-09 DIAGNOSIS — R079 Chest pain, unspecified: Secondary | ICD-10-CM | POA: Insufficient documentation

## 2020-11-09 DIAGNOSIS — F17210 Nicotine dependence, cigarettes, uncomplicated: Secondary | ICD-10-CM | POA: Diagnosis not present

## 2020-11-09 DIAGNOSIS — Z79899 Other long term (current) drug therapy: Secondary | ICD-10-CM | POA: Diagnosis not present

## 2020-11-09 DIAGNOSIS — R1110 Vomiting, unspecified: Secondary | ICD-10-CM | POA: Insufficient documentation

## 2020-11-09 DIAGNOSIS — G8929 Other chronic pain: Secondary | ICD-10-CM

## 2020-11-09 DIAGNOSIS — J45909 Unspecified asthma, uncomplicated: Secondary | ICD-10-CM | POA: Insufficient documentation

## 2020-11-09 DIAGNOSIS — Y900 Blood alcohol level of less than 20 mg/100 ml: Secondary | ICD-10-CM | POA: Insufficient documentation

## 2020-11-09 DIAGNOSIS — R1084 Generalized abdominal pain: Secondary | ICD-10-CM | POA: Diagnosis present

## 2020-11-09 DIAGNOSIS — I10 Essential (primary) hypertension: Secondary | ICD-10-CM | POA: Insufficient documentation

## 2020-11-09 LAB — COMPREHENSIVE METABOLIC PANEL
ALT: 16 U/L (ref 0–44)
AST: 22 U/L (ref 15–41)
Albumin: 3.4 g/dL — ABNORMAL LOW (ref 3.5–5.0)
Alkaline Phosphatase: 101 U/L (ref 38–126)
Anion gap: 8 (ref 5–15)
BUN: 5 mg/dL — ABNORMAL LOW (ref 6–20)
CO2: 23 mmol/L (ref 22–32)
Calcium: 9.1 mg/dL (ref 8.9–10.3)
Chloride: 107 mmol/L (ref 98–111)
Creatinine, Ser: 0.85 mg/dL (ref 0.61–1.24)
GFR, Estimated: >60 mL/min (ref >60)
Glucose, Bld: 113 mg/dL — ABNORMAL HIGH (ref 70–99)
Potassium: 3.7 mmol/L (ref 3.5–5.1)
Sodium: 138 mmol/L (ref 135–145)
Total Bilirubin: 0.4 mg/dL (ref 0.3–1.2)
Total Protein: 7.5 g/dL (ref 6.5–8.1)

## 2020-11-09 LAB — CBC WITH DIFFERENTIAL/PLATELET
Abs Immature Granulocytes: 0.13 10*3/uL — ABNORMAL HIGH (ref 0.00–0.07)
Basophils Absolute: 0 10*3/uL (ref 0.0–0.1)
Basophils Relative: 0 %
Eosinophils Absolute: 0.2 10*3/uL (ref 0.0–0.5)
Eosinophils Relative: 1 %
HCT: 34.9 % — ABNORMAL LOW (ref 39.0–52.0)
Hemoglobin: 10.9 g/dL — ABNORMAL LOW (ref 13.0–17.0)
Immature Granulocytes: 1 %
Lymphocytes Relative: 15 %
Lymphs Abs: 2.4 10*3/uL (ref 0.7–4.0)
MCH: 31.5 pg (ref 26.0–34.0)
MCHC: 31.2 g/dL (ref 30.0–36.0)
MCV: 100.9 fL — ABNORMAL HIGH (ref 80.0–100.0)
Monocytes Absolute: 1.4 10*3/uL — ABNORMAL HIGH (ref 0.1–1.0)
Monocytes Relative: 9 %
Neutro Abs: 11.9 10*3/uL — ABNORMAL HIGH (ref 1.7–7.7)
Neutrophils Relative %: 74 %
Platelets: 338 10*3/uL (ref 150–400)
RBC: 3.46 MIL/uL — ABNORMAL LOW (ref 4.22–5.81)
RDW: 15.7 % — ABNORMAL HIGH (ref 11.5–15.5)
WBC: 16 10*3/uL — ABNORMAL HIGH (ref 4.0–10.5)
nRBC: 0 % (ref 0.0–0.2)

## 2020-11-09 LAB — LIPASE, BLOOD: Lipase: 34 U/L (ref 11–51)

## 2020-11-09 LAB — ETHANOL: Alcohol, Ethyl (B): <10 mg/dL (ref <10)

## 2020-11-09 LAB — POC OCCULT BLOOD, ED: Fecal Occult Bld: NEGATIVE

## 2020-11-09 LAB — TROPONIN I (HIGH SENSITIVITY)
Troponin I (High Sensitivity): 4 ng/L (ref <18)
Troponin I (High Sensitivity): 8 ng/L (ref <18)

## 2020-11-09 MED ORDER — ALUM & MAG HYDROXIDE-SIMETH 200-200-20 MG/5ML PO SUSP
30.0000 mL | Freq: Once | ORAL | Status: AC
  Administered 2020-11-09: 30 mL via ORAL
  Filled 2020-11-09: qty 30

## 2020-11-09 MED ORDER — LIDOCAINE VISCOUS HCL 2 % MT SOLN
15.0000 mL | Freq: Once | OROMUCOSAL | Status: AC
  Administered 2020-11-09: 15 mL via OROMUCOSAL
  Filled 2020-11-09: qty 15

## 2020-11-09 MED ORDER — ONDANSETRON 4 MG PO TBDP
4.0000 mg | ORAL_TABLET | Freq: Once | ORAL | Status: AC
  Administered 2020-11-09: 4 mg via ORAL
  Filled 2020-11-09: qty 1

## 2020-11-09 MED ORDER — OXYCODONE HCL 5 MG PO TABS
5.0000 mg | ORAL_TABLET | Freq: Once | ORAL | Status: AC
  Administered 2020-11-09: 5 mg via ORAL
  Filled 2020-11-09: qty 1

## 2020-11-09 NOTE — ED Provider Notes (Signed)
[facility] EMERGENCY DEPARTMENT Provider Note   CSN: [id number] Arrival date & time: 11/09/20  1622     History Chief Complaint  Patient presents with   Chest Pain    [name] is a 50 y.o. male.  HPI 50 year old male presents with vomiting, abdominal/chest pain, diarrhea with bloody stools.  Started this morning.  He was admitted for the same over the last week or so.  He had endoscopy and sigmoidoscopy.  He states that the stool was bloody at first with 1 episode then turned dark.  He has been vomiting but no hematemesis.  Abdominal pain is diffuse and goes into his chest.  Has been taking his medicines as prescribed.  Past Medical History:  Diagnosis Date   Alcoholism /alcohol abuse    Anemia    Anxiety    Arthritis    "knees; arms; elbows" (03/26/2015)   Asthma    Bipolar disorder (HCC)    Chronic bronchitis (HCC)    Chronic lower back pain    Chronic pancreatitis ([place])    Cocaine abuse ([place])    Depression    Family history of adverse reaction to anesthesia    "grandmother gets confused"   Femoral condyle fracture ([place]) 03/08/2014   left medial/notes 03/09/2014   GERD (gastroesophageal reflux disease)    H/O hiatal hernia    H/O suicide attempt 10/2012   High cholesterol    History of blood transfusion 10/2012   "when I tried to commit suicide"   History of stomach ulcers    Hypertension    Marijuana abuse, continuous    Migraine    "a few times/year" (03/26/2015)   Pneumonia 1990's X 3   PTSD (post-traumatic stress disorder)    Sickle cell trait ([place])    WPW (Wolff-Parkinson-White syndrome)    [name] 03/06/2013    Patient Active Problem List   Diagnosis Date Noted   Pancreatitis 10/30/2020   Rectal ulcer    Hypomagnesemia    Hypocalcemia 10/23/2020   AKI (acute kidney injury) ([place]) 11/13/2018   Seizure ([place]) 11/13/2018   Gastritis and gastroduodenitis    Nonspecific chest pain 01/08/2018   GI bleed 11/24/2017   Acute  blood loss anemia 11/24/2017   Atypical chest pain 11/24/2017   Acute pancreatitis 09/28/2017   Abdominal pain 05/27/2017   Hematemesis 05/27/2017   Tachycardia 03/18/2017   Diarrhea 03/18/2017   Acute on chronic pancreatitis ([place]) 12/17/2016   Chronic alcoholic pancreatitis ([place]) 12/05/2016   Intractable nausea and vomiting 12/05/2016   Verbally abusive behavior 12/05/2016   Normocytic anemia 12/05/2016   Alcohol use disorder, severe, dependence ([place]) 07/25/2016   Cocaine use disorder, severe, dependence ([place]) 07/25/2016   Major depressive disorder, 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   Leukocytosis    Hospital acquired PNA 05/20/2015   Chronic pancreatitis ([place]) 05/18/2015   Pseudocyst of pancreas 05/18/2015   Polysubstance abuse (tobacco, cocaine, THC, and ETOH) 03/26/2015   Alcohol-induced chronic pancreatitis ([place])    Essential hypertension 02/06/2014   Mood disorder ([place]) 02/06/2014   Alcohol-induced acute pancreatitis 11/28/2013   Pancreatic pseudocyst/cyst 11/25/2013   Severe protein-calorie malnutrition ([place]) 10/10/2013   Suicide attempt ([place]) 10/08/2013   [name] Parkinson White pattern seen on electrocardiogram 10/03/2012   TOBACCO ABUSE 03/23/2007    Past Surgical History:  Procedure Laterality Date   BIOPSY  11/25/2017   Procedure: BIOPSY;  Surgeon: Arta Silence, MD;  Location: [place];  Service: Endoscopy;;   BIOPSY  10/14/2018  Procedure: BIOPSY;  Surgeon: Arta Silence, MD;  Location: [place];  Service: Endoscopy;;   CARDIAC CATHETERIZATION     CYST ENTEROSTOMY  01/02/2020   Procedure: CYST ASPIRATION;  Surgeon: Milus Banister, MD;  Location: [facility];  Service: Endoscopy;;   ESOPHAGOGASTRODUODENOSCOPY (EGD) WITH PROPOFOL N/A 11/25/2017   Procedure: ESOPHAGOGASTRODUODENOSCOPY (EGD) WITH PROPOFOL;  Surgeon: Arta Silence, MD;  Location: [place];  Service: Endoscopy;  Laterality: N/A;   ESOPHAGOGASTRODUODENOSCOPY  (EGD) WITH PROPOFOL Left 10/14/2018   Procedure: ESOPHAGOGASTRODUODENOSCOPY (EGD) WITH PROPOFOL;  Surgeon: Arta Silence, MD;  Location: [facility] ENDOSCOPY;  Service: Endoscopy;  Laterality: Left;   ESOPHAGOGASTRODUODENOSCOPY (EGD) WITH PROPOFOL N/A 11/14/2018   Procedure: ESOPHAGOGASTRODUODENOSCOPY (EGD) WITH PROPOFOL;  Surgeon: Laurence Spates, MD;  Location: [facility];  Service: Gastroenterology;  Laterality: N/A;   ESOPHAGOGASTRODUODENOSCOPY (EGD) WITH PROPOFOL N/A 01/02/2020   Procedure: ESOPHAGOGASTRODUODENOSCOPY (EGD) WITH PROPOFOL;  Surgeon: Milus Banister, MD;  Location: [facility];  Service: Endoscopy;  Laterality: N/A;   ESOPHAGOGASTRODUODENOSCOPY (EGD) WITH PROPOFOL N/A 10/25/2020   Procedure: ESOPHAGOGASTRODUODENOSCOPY (EGD) WITH PROPOFOL;  Surgeon: Rush Landmark Telford Nab., MD;  Location: [place];  Service: Gastroenterology;  Laterality: N/A;   EUS N/A 01/02/2020   Procedure: UPPER ENDOSCOPIC ULTRASOUND (EUS) RADIAL;  Surgeon: Milus Banister, MD;  Location: [facility];  Service: Endoscopy;  Laterality: N/A;   EYE SURGERY Left 1990's   "result of trauma"    FACIAL FRACTURE SURGERY Left 1990's   "result of trauma"    FLEXIBLE SIGMOIDOSCOPY N/A 10/25/2020   Procedure: FLEXIBLE SIGMOIDOSCOPY;  Surgeon: Irving Copas., MD;  Location: [place];  Service: Gastroenterology;  Laterality: N/A;   FRACTURE SURGERY     HEMOSTASIS CLIP PLACEMENT  10/25/2020   Procedure: HEMOSTASIS CLIP PLACEMENT;  Surgeon: Irving Copas., MD;  Location: [place];  Service: Gastroenterology;;   HERNIA REPAIR     LEFT HEART CATHETERIZATION WITH CORONARY ANGIOGRAM Right 03/07/2013   Procedure: LEFT HEART CATHETERIZATION WITH CORONARY ANGIOGRAM;  Surgeon: [name], MD;  Location: [place] CATH LAB;  Service: Cardiovascular;  Laterality: Right;   UMBILICAL HERNIA REPAIR     UPPER GASTROINTESTINAL ENDOSCOPY         Family History  Problem Relation Age of Onset   Hypertension Mother     Cirrhosis Father    Alcoholism Father    Hypertension Father    Melanoma Father    Hypertension Other    Coronary artery disease Other     Social History   Tobacco Use   Smoking status: Every Day    Packs/day: 1.00    Years: 36.00    Pack years: 36.00    Types: Cigarettes, E-cigarettes   Smokeless tobacco: Never  Vaping Use   Vaping Use: Former  Substance Use Topics   Alcohol use: Yes    Comment: last drink 8 days ago   Drug use: Yes    Types: Marijuana, Cocaine    Comment: daily marijuana use; last cocaine use about 3 months ago    Home Medications Prior to Admission medications   Medication Sig Start Date End Date Taking? Authorizing Provider  acetaminophen (TYLENOL) 500 MG tablet Take 1,000 mg by mouth every 6 (six) hours as needed for moderate pain or headache.    [provider]  albuterol (VENTOLIN HFA) 108 (90 Base) MCG/ACT inhaler Inhale 2 puffs into the lungs every 4 (four) hours as needed for wheezing or shortness of breath. 11/12/19   [name], DO  cholecalciferol (VITAMIN D) 25 MCG tablet  Take 1 tablet (1,000 Units total) by mouth daily. 10/30/20   [name], MD  fluticasone furoate-vilanterol (BREO ELLIPTA) 200-25 MCG/INH AEPB Inhale 1 puff into the lungs daily. 11/12/19   [name], DO  folic acid (FOLVITE) 1 MG tablet Take 1 tablet (1 mg total) by mouth daily. 11/26/17   [name], MD  lidocaine (XYLOCAINE) 2 % solution Use as directed 15 mLs in the mouth or throat every 6 (six) hours as needed for mouth pain. 05/25/20   [name], MD  lipase/protease/amylase (CREON) 36000 UNITS CPEP capsule Take 2 capsules (72,000 Units total) by mouth 3 (three) times daily with meals. For pancreatitis 11/05/19   [name], [name], MD  metoprolol (TOPROL-XL) 200 MG 24 hr tablet Take 200 mg by mouth daily. 10/29/20   [provider]  Multiple Vitamin (MULTIVITAMIN WITH MINERALS) TABS tablet Take 1 tablet by mouth daily. 09/06/17   Bonnell Public, MD  omeprazole (PRILOSEC) 20 MG capsule Take 20 mg by mouth daily.    [provider]  ondansetron (ZOFRAN-ODT) 4 MG disintegrating tablet Take 4 mg by mouth 2 (two) times daily as needed for vomiting or nausea. 10/20/20   [provider]  sertraline (ZOLOFT) 100 MG tablet Take 1 tablet (100 mg total) by mouth daily. 03/27/20   [name], MD  sucralfate (CARAFATE) 1 g tablet Take 1 tablet (1 g total) by mouth 4 (four) times daily -  with meals and at bedtime. 05/23/20   Kerin Perna, NP  thiamine 100 MG tablet Take 1 tablet (100 mg total) by mouth daily. 10/16/18   [name], MD  amitriptyline (ELAVIL) 25 MG tablet Take 1 tablet (25 mg total) by mouth at bedtime. Patient not taking: Reported on 08/08/2019 10/15/18 08/08/19  [name], MD  gabapentin (NEURONTIN) 100 MG capsule Take 1 capsule (100 mg total) by mouth 2 (two) times daily. Patient not taking: Reported on 08/08/2019 10/15/18 08/08/19  [name], MD  promethazine (PHENERGAN) 25 MG tablet Take 1 tablet (25 mg total) by mouth every 6 (six) hours as needed for nausea or vomiting. Patient not taking: Reported on 08/08/2019 03/02/19 08/08/19  Kinnie Feil, PA-C    Allergies    Robaxin [methocarbamol], Aspirin, Shellfish-derived products, Trazodone and nefazodone, Adhesive [tape], Latex, Toradol [ketorolac tromethamine], Contrast media [iodinated diagnostic agents], and Reglan [metoclopramide]  Review of Systems   Review of Systems  Cardiovascular:  Positive for chest pain.  Gastrointestinal:  Positive for abdominal pain, blood in stool, diarrhea, nausea and vomiting.  All other systems reviewed and are negative.  Physical Exam Updated Vital Signs BP 129/88 (BP Location: Right Arm)   Pulse 87   Temp 98.4 F (36.9 C) (Oral)   Resp 19   SpO2 98%   Physical Exam Vitals and nursing note reviewed.  Constitutional:      Appearance: He is well-developed.  HENT:     Head: Normocephalic  and atraumatic.     Right Ear: External ear normal.     Left Ear: External ear normal.     Nose: Nose normal.  Eyes:     General:        Right eye: No discharge.        Left eye: No discharge.  Cardiovascular:     Rate and Rhythm: Normal rate and regular rhythm.     Heart sounds: Normal heart sounds.  Pulmonary:     Effort: Pulmonary effort is normal.  Breath sounds: Normal breath sounds.  Abdominal:     Palpations: Abdomen is soft.     Tenderness: There is abdominal tenderness.  Genitourinary:    Rectum: Guaiac result negative. No mass, tenderness or external hemorrhoid.     Comments: Brown stool on DRE Musculoskeletal:     Cervical back: Neck supple.  Skin:    General: Skin is warm and dry.  Neurological:     Mental Status: He is alert.  Psychiatric:        Mood and Affect: Mood is not anxious.    ED Results / Procedures / Treatments   Labs (all labs ordered are listed, but only abnormal results are displayed) Labs Reviewed  COMPREHENSIVE METABOLIC PANEL - Abnormal; Notable for the following components:      Result Value   Glucose, Bld 113 (*)    BUN 5 (*)    Albumin 3.4 (*)    All other components within normal limits  CBC WITH DIFFERENTIAL/PLATELET - Abnormal; Notable for the following components:   WBC 16.0 (*)    RBC 3.46 (*)    Hemoglobin 10.9 (*)    HCT 34.9 (*)    MCV 100.9 (*)    RDW 15.7 (*)    Neutro Abs 11.9 (*)    Monocytes Absolute 1.4 (*)    Abs Immature Granulocytes 0.13 (*)    All other components within normal limits  ETHANOL  LIPASE, BLOOD  POC OCCULT BLOOD, ED  TROPONIN I (HIGH SENSITIVITY)  TROPONIN I (HIGH SENSITIVITY)    EKG EKG Interpretation  Date/Time:  Monday November 09 2020 16:37:26 EDT Ventricular Rate:  97 PR Interval:  102 QRS Duration: 78 QT Interval:  324 QTC Calculation: 411 R Axis:   78 Text Interpretation: Sinus rhythm with short PR Biatrial enlargement Nonspecific T wave abnormality  no significant change since  June 2022 Confirmed by Sherwood Gambler [phone]) on 11/09/2020 8:19:06 PM  Radiology DG Chest 2 View  Result Date: 11/09/2020 CLINICAL DATA:  Chest pain and shortness of breath EXAM: CHEST - 2 VIEW COMPARISON:  Chest radiograph 10/23/2020 FINDINGS: Mild chronic hyperinflation and bronchial thickening.The cardiomediastinal contours are normal. Pulmonary vasculature is normal. No consolidation, pleural effusion, or pneumothorax. No acute osseous abnormalities are seen. IMPRESSION: Mild chronic hyperinflation and bronchial thickening suggesting COPD. No acute abnormality. Electronically Signed   By: Keith Rake M.D.   On: 11/09/2020 17:10    Procedures Procedures   Medications Ordered in ED Medications  oxyCODONE (Oxy IR/ROXICODONE) immediate release tablet 5 mg (5 mg Oral Given 11/09/20 2052)  ondansetron (ZOFRAN-ODT) disintegrating tablet 4 mg (4 mg Oral Given 11/09/20 2052)  alum & mag hydroxide-simeth (MAALOX/MYLANTA) 200-200-20 MG/5ML suspension 30 mL (30 mLs Oral Given 11/09/20 2052)  lidocaine (XYLOCAINE) 2 % viscous mouth solution 15 mL (15 mLs Mouth/Throat Given 11/09/20 2100)    ED Course  I have reviewed the triage vital signs and the nursing notes.  Pertinent labs & imaging results that were available during my care of the patient were reviewed by me and considered in my medical decision making (see chart for details).    MDM Rules/Calculators/A&P                          Patient presents with what appears to be an exacerbation of chronic abdominal pain.  He states he goes into his chest but this is not sounding like a primary cardiac cause.  Highly doubt PE, ACS, dissection.  His troponin did go from 4-8, but both are still negative and given the low suspicion of ACS I do not think repeat testing is needed.  He reports bloody stool but not seen on rectal exam and no melena.  No vomiting here.  He tolerated oral meds fine.  At this point he appears stable for outpatient follow-up.  He is  asking for a refill of oxycodone which I discussed he needs to get from his PCP.  Given return precautions. Final Clinical Impression(s) / ED Diagnoses Final diagnoses:  Chronic abdominal pain    Rx / DC Orders ED Discharge Orders     None        Sherwood Gambler, MD 11/10/20 [phone]

## 2020-11-09 NOTE — ED Triage Notes (Signed)
Pt BIB [facility], c/o chest pain that radiates to the abdomen. Recently d/c from hospital.

## 2020-11-09 NOTE — ED Provider Notes (Signed)
Emergency Medicine Provider Triage Evaluation Note  [name] , a 50 y.o. male  was evaluated in triage.  Pt complains of chest pain radiating into the abdomen.  He was just recently discharged from the hospital for the same reports of vomiting and severe abdominal pain.  Reports today he seen some blood in his stool.  Reports it was bright red and now it is dark.  Reports he has been taking his medications regularly.  Review of Systems  Positive: Vomiting, abdominal pain, vomiting, chest pain, blood in stool Negative: Shortness of breath, fever  Physical Exam  BP (!) 134/98 (BP Location: Left Arm)   Pulse 98   Temp 98.4 F (36.9 C) (Oral)   Resp 18   SpO2 98%  Gen:   Awake, no distress   Resp:  Normal effort  MSK:   Moves extremities without difficulty  Other:  Patient sitting calmly looking through things and backpacking as soon as I entered the room he dropped what he was holding and began pain.  Has generalized tenderness throughout abdominal exam.  Medical Decision Making  Medically screening exam initiated at 4:30 PM.  Appropriate orders placed.  [name] was informed that the remainder of the evaluation will be completed by another provider, this initial triage assessment does not replace that evaluation, and the importance of remaining in the ED until their evaluation is complete.     Jacqlyn Larsen, PA-C 11/09/20 1634    Varney Biles, MD 11/10/20 1021

## 2020-11-09 NOTE — Discharge Instructions (Addendum)
If you develop worsening, continued, or recurrent abdominal pain, uncontrolled vomiting, fever, chest or back pain, or any other new/concerning symptoms then return to the ER for evaluation.  

## 2020-11-10 ENCOUNTER — Emergency Department (HOSPITAL_COMMUNITY): Admission: EM | Admit: 2020-11-10 | Discharge: 2020-11-10 | Discharge status: Against Medical Advice | Payer: 59

## 2020-11-10 ENCOUNTER — Encounter (HOSPITAL_COMMUNITY): Payer: Self-pay

## 2020-11-10 ENCOUNTER — Emergency Department (HOSPITAL_COMMUNITY): Payer: 59

## 2020-11-10 ENCOUNTER — Other Ambulatory Visit: Payer: Self-pay

## 2020-11-10 ENCOUNTER — Emergency Department (HOSPITAL_COMMUNITY)
Admission: EM | Admit: 2020-11-10 | Discharge: 2020-11-11 | Disposition: A | Discharge status: Home / Self Care | Payer: 59 | Source: Home / Self Care | Attending: Emergency Medicine | Admitting: Emergency Medicine

## 2020-11-10 ENCOUNTER — Emergency Department (HOSPITAL_COMMUNITY)
Admission: EM | Admit: 2020-11-10 | Discharge: 2020-11-10 | Disposition: A | Discharge status: Home / Self Care | Payer: 59 | Source: Home / Self Care | Attending: Emergency Medicine | Admitting: Emergency Medicine

## 2020-11-10 ENCOUNTER — Emergency Department (HOSPITAL_COMMUNITY)
Admission: EM | Admit: 2020-11-10 | Discharge: 2020-11-10 | Disposition: A | Discharge status: Home / Self Care | Payer: 59 | Attending: Emergency Medicine | Admitting: Emergency Medicine

## 2020-11-10 DIAGNOSIS — R1010 Upper abdominal pain, unspecified: Secondary | ICD-10-CM | POA: Insufficient documentation

## 2020-11-10 DIAGNOSIS — K219 Gastro-esophageal reflux disease without esophagitis: Secondary | ICD-10-CM | POA: Insufficient documentation

## 2020-11-10 DIAGNOSIS — R1013 Epigastric pain: Secondary | ICD-10-CM | POA: Insufficient documentation

## 2020-11-10 DIAGNOSIS — Z91040 Latex allergy status: Secondary | ICD-10-CM | POA: Insufficient documentation

## 2020-11-10 DIAGNOSIS — J45909 Unspecified asthma, uncomplicated: Secondary | ICD-10-CM | POA: Insufficient documentation

## 2020-11-10 DIAGNOSIS — Z955 Presence of coronary angioplasty implant and graft: Secondary | ICD-10-CM | POA: Insufficient documentation

## 2020-11-10 DIAGNOSIS — R1012 Left upper quadrant pain: Secondary | ICD-10-CM | POA: Insufficient documentation

## 2020-11-10 DIAGNOSIS — R109 Unspecified abdominal pain: Secondary | ICD-10-CM

## 2020-11-10 DIAGNOSIS — G8929 Other chronic pain: Secondary | ICD-10-CM | POA: Insufficient documentation

## 2020-11-10 DIAGNOSIS — Z7951 Long term (current) use of inhaled steroids: Secondary | ICD-10-CM | POA: Insufficient documentation

## 2020-11-10 DIAGNOSIS — F17210 Nicotine dependence, cigarettes, uncomplicated: Secondary | ICD-10-CM | POA: Insufficient documentation

## 2020-11-10 DIAGNOSIS — R079 Chest pain, unspecified: Secondary | ICD-10-CM | POA: Insufficient documentation

## 2020-11-10 DIAGNOSIS — Z79899 Other long term (current) drug therapy: Secondary | ICD-10-CM | POA: Insufficient documentation

## 2020-11-10 DIAGNOSIS — I10 Essential (primary) hypertension: Secondary | ICD-10-CM | POA: Insufficient documentation

## 2020-11-10 LAB — COMPREHENSIVE METABOLIC PANEL
ALT: 14 U/L (ref 0–44)
AST: 18 U/L (ref 15–41)
Albumin: 3.8 g/dL (ref 3.5–5.0)
Alkaline Phosphatase: 95 U/L (ref 38–126)
Anion gap: 10 (ref 5–15)
BUN: 8 mg/dL (ref 6–20)
CO2: 22 mmol/L (ref 22–32)
Calcium: 8.8 mg/dL — ABNORMAL LOW (ref 8.9–10.3)
Chloride: 103 mmol/L (ref 98–111)
Creatinine, Ser: 0.61 mg/dL (ref 0.61–1.24)
GFR, Estimated: >60 mL/min (ref >60)
Glucose, Bld: 99 mg/dL (ref 70–99)
Potassium: 4 mmol/L (ref 3.5–5.1)
Sodium: 135 mmol/L (ref 135–145)
Total Bilirubin: 0.6 mg/dL (ref 0.3–1.2)
Total Protein: 7.7 g/dL (ref 6.5–8.1)

## 2020-11-10 LAB — CBC WITH DIFFERENTIAL/PLATELET
Abs Immature Granulocytes: 0.35 10*3/uL — ABNORMAL HIGH (ref 0.00–0.07)
Basophils Absolute: 0 10*3/uL (ref 0.0–0.1)
Basophils Relative: 0 %
Eosinophils Absolute: 0.1 10*3/uL (ref 0.0–0.5)
Eosinophils Relative: 0 %
HCT: 34 % — ABNORMAL LOW (ref 39.0–52.0)
Hemoglobin: 10.9 g/dL — ABNORMAL LOW (ref 13.0–17.0)
Immature Granulocytes: 1 %
Lymphocytes Relative: 4 %
Lymphs Abs: 1.3 10*3/uL (ref 0.7–4.0)
MCH: 31.9 pg (ref 26.0–34.0)
MCHC: 32.1 g/dL (ref 30.0–36.0)
MCV: 99.4 fL (ref 80.0–100.0)
Monocytes Absolute: 2.3 10*3/uL — ABNORMAL HIGH (ref 0.1–1.0)
Monocytes Relative: 8 %
Neutro Abs: 25.2 10*3/uL — ABNORMAL HIGH (ref 1.7–7.7)
Neutrophils Relative %: 87 %
Platelets: 358 10*3/uL (ref 150–400)
RBC: 3.42 MIL/uL — ABNORMAL LOW (ref 4.22–5.81)
RDW: 15.8 % — ABNORMAL HIGH (ref 11.5–15.5)
WBC: 29.2 10*3/uL — ABNORMAL HIGH (ref 4.0–10.5)
nRBC: 0 % (ref 0.0–0.2)

## 2020-11-10 LAB — TROPONIN I (HIGH SENSITIVITY)
Troponin I (High Sensitivity): 2 ng/L (ref <18)
Troponin I (High Sensitivity): 2 ng/L (ref <18)

## 2020-11-10 LAB — LIPASE, BLOOD: Lipase: 26 U/L (ref 11–51)

## 2020-11-10 MED ORDER — ONDANSETRON 4 MG PO TBDP
4.0000 mg | ORAL_TABLET | Freq: Once | ORAL | Status: AC
  Administered 2020-11-10: 4 mg via ORAL
  Filled 2020-11-10: qty 1

## 2020-11-10 MED ORDER — PANTOPRAZOLE SODIUM 40 MG IV SOLR
40.0000 mg | Freq: Once | INTRAVENOUS | Status: AC
  Administered 2020-11-10: 40 mg via INTRAVENOUS
  Filled 2020-11-10: qty 40

## 2020-11-10 MED ORDER — HYDROMORPHONE HCL 4 MG PO TABS: 4.0000 mg | ORAL_TABLET | Freq: Four times a day (QID) | ORAL | 0 refills | Status: DC | PRN

## 2020-11-10 MED ORDER — HYDROMORPHONE HCL 1 MG/ML IJ SOLN
0.5000 mg | Freq: Once | INTRAMUSCULAR | Status: AC
  Administered 2020-11-10: 0.5 mg via INTRAVENOUS
  Filled 2020-11-10: qty 1

## 2020-11-10 MED ORDER — ALUM & MAG HYDROXIDE-SIMETH 200-200-20 MG/5ML PO SUSP
30.0000 mL | Freq: Once | ORAL | Status: AC
  Administered 2020-11-10: 30 mL via ORAL
  Filled 2020-11-10: qty 30

## 2020-11-10 MED ORDER — LIDOCAINE VISCOUS HCL 2 % MT SOLN
15.0000 mL | Freq: Once | OROMUCOSAL | Status: AC
  Administered 2020-11-10: 15 mL via ORAL
  Filled 2020-11-10: qty 15

## 2020-11-10 MED ORDER — LORAZEPAM 2 MG/ML IJ SOLN
0.5000 mg | Freq: Once | INTRAMUSCULAR | Status: AC
  Administered 2020-11-10: 0.5 mg via INTRAVENOUS
  Filled 2020-11-10: qty 1

## 2020-11-10 MED ORDER — SODIUM CHLORIDE 0.9 % IV BOLUS
1000.0000 mL | Freq: Once | INTRAVENOUS | Status: AC
  Administered 2020-11-10: 1000 mL via INTRAVENOUS

## 2020-11-10 MED ORDER — HYDROMORPHONE HCL 1 MG/ML IJ SOLN
1.0000 mg | Freq: Once | INTRAMUSCULAR | Status: AC
  Administered 2020-11-10: 1 mg via INTRAVENOUS
  Filled 2020-11-10: qty 1

## 2020-11-10 NOTE — ED Notes (Signed)
Registration informed this tech pt has left the emergency room.

## 2020-11-10 NOTE — ED Notes (Signed)
Patient transported to CT 

## 2020-11-10 NOTE — ED Provider Notes (Signed)
Emergency Medicine Provider Triage Evaluation Note  [name] , a 50 y.o. male  was evaluated in triage.  Pt complains of abdominal pain and chest pain.  Symptoms have been going on today but worsened in the past 45 minutes.  Pain despite taking his home oxycodone.  Reports vomiting.  Review of Systems  Positive: Abdominal pain, chest pain, vomiting Negative: Fever  Physical Exam  BP 117/89 (BP Location: Left Arm)   Pulse (!) 104   Temp 98.4 F (36.9 C) (Oral)   Resp 18   Ht 5\' 8"  (1.727 m)   Wt 56 kg   SpO2 100%   BMI 18.77 kg/m  Gen:   Awake, no distress   Resp:  Normal effort  MSK:   Moves extremities without difficulty  Other:  Generalized tenderness  Medical Decision Making  Medically screening exam initiated at 2:21 PM.  Appropriate orders placed.  [name] was informed that the remainder of the evaluation will be completed by another provider, this initial triage assessment does not replace that evaluation, and the importance of remaining in the ED until their evaluation is complete.  Lab work and EKG ordered   Delia Heady, PA-C 11/10/20 1421    Dorie Rank, MD 11/11/20 [phone]

## 2020-11-10 NOTE — ED Triage Notes (Signed)
Pt arrived via EMS. Pt was discharged an hour ago. Pt has been having red emesis and coffee ground stools. Pt left [facility] AMA this morning and came to [facility] by POV, where he was discharged.

## 2020-11-10 NOTE — ED Provider Notes (Signed)
[name]   CSN: [id number] Arrival date & time: 11/10/20  1358     History Chief Complaint  Patient presents with  . Chest Pain    [name] is a 50 y.o. male.  Patient complains of epigastric abdominal pain.  Patient has a history of pancreatitis and just left the hospital  The history is provided by the patient. No language interpreter was used.  Abdominal Pain Pain location:  Epigastric Pain quality: aching   Pain radiates to:  Does not radiate Pain severity:  Moderate Onset quality:  Sudden Timing:  Constant Progression:  Worsening Chronicity:  Recurrent Context: alcohol use   Associated symptoms: chest pain   Associated symptoms: no cough, no diarrhea, no fatigue and no hematuria   Chest Pain Associated symptoms: abdominal pain   Associated symptoms: no back pain, no cough, no fatigue and no headache       Past Medical History:  Diagnosis Date  . Alcoholism /alcohol abuse   . Anemia   . Anxiety   . Arthritis    "knees; arms; elbows" (03/26/2015)  . Asthma   . Bipolar disorder ([place])   . Chronic bronchitis ([place])   . Chronic lower back pain   . Chronic pancreatitis ([place])   . Cocaine abuse ([place])   . Depression   . Family history of adverse reaction to anesthesia    "grandmother gets confused"  . Femoral condyle fracture ([place]) 03/08/2014   left medial/notes 03/09/2014  . GERD (gastroesophageal reflux disease)   . H/O hiatal hernia   . H/O suicide attempt 10/2012  . High cholesterol   . History of blood transfusion 10/2012   "when I tried to commit suicide"  . History of stomach ulcers   . Hypertension   . Marijuana abuse, continuous   . Migraine    "a few times/year" (03/26/2015)  . Pneumonia 1990's X 3  . PTSD (post-traumatic stress disorder)   . Sickle cell trait ([place])   . WPW (Wolff-Parkinson-White syndrome)    [name] 03/06/2013    Patient Active Problem List   Diagnosis Date  Noted  . Pancreatitis 10/30/2020  . Rectal ulcer   . Hypomagnesemia   . Hypocalcemia 10/23/2020  . AKI (acute kidney injury) ([place]) 11/13/2018  . Seizure ([place]) 11/13/2018  . Gastritis and gastroduodenitis   . Nonspecific chest pain 01/08/2018  . GI bleed 11/24/2017  . Acute blood loss anemia 11/24/2017  . Atypical chest pain 11/24/2017  . Acute pancreatitis 09/28/2017  . Abdominal pain 05/27/2017  . Hematemesis 05/27/2017  . Tachycardia 03/18/2017  . Diarrhea 03/18/2017  . Acute on chronic pancreatitis ([place]) 12/17/2016  . Chronic alcoholic pancreatitis ([place]) 12/05/2016  . Intractable nausea and vomiting 12/05/2016  . Verbally abusive behavior 12/05/2016  . Normocytic anemia 12/05/2016  . Alcohol use disorder, severe, dependence ([place]) 07/25/2016  . Cocaine use disorder, severe, dependence ([place]) 07/25/2016  . 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 Leukocytosis   . Hospital acquired PNA 05/20/2015  . Chronic pancreatitis ([place]) 05/18/2015  . Pseudocyst of pancreas 05/18/2015  . Polysubstance abuse (tobacco, cocaine, THC, and ETOH) 03/26/2015  . Alcohol-induced chronic pancreatitis ([place])   . Essential hypertension 02/06/2014  . Mood disorder ([place]) 02/06/2014  . Alcohol-induced acute pancreatitis 11/28/2013  . Pancreatic pseudocyst/cyst 11/25/2013  . Severe protein-calorie malnutrition ([place]) 10/10/2013  . Suicide attempt ([place]) 10/08/2013  . 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 TOBACCO ABUSE  03/23/2007    Past Surgical History:  Procedure Laterality Date  . BIOPSY  11/25/2017   Procedure: BIOPSY;  Surgeon: Arta Silence, MD;  Location: [facility] ENDOSCOPY;  Service: Endoscopy;;  . BIOPSY  10/14/2018   Procedure: BIOPSY;  Surgeon: Arta Silence, MD;  Location: [facility];  Service: Endoscopy;;  . CARDIAC CATHETERIZATION    . CYST ENTEROSTOMY  01/02/2020   Procedure: CYST ASPIRATION;  Surgeon:  Milus Banister, MD;  Location: [facility] ENDOSCOPY;  Service: Endoscopy;;  . ESOPHAGOGASTRODUODENOSCOPY (EGD) WITH PROPOFOL N/A 11/25/2017   Procedure: ESOPHAGOGASTRODUODENOSCOPY (EGD) WITH PROPOFOL;  Surgeon: Arta Silence, MD;  Location: [place];  Service: Endoscopy;  Laterality: N/A;  . ESOPHAGOGASTRODUODENOSCOPY (EGD) WITH PROPOFOL Left 10/14/2018   Procedure: ESOPHAGOGASTRODUODENOSCOPY (EGD) WITH PROPOFOL;  Surgeon: Arta Silence, MD;  Location[facility]COPY;  Service: Endoscopy;  Laterality: Left;  . ESOPHAGOGASTRODUODENOSCOPY (EGD) WITH PROPOFOL N/A 11/14/2018   Procedure: ESOPHAGOGASTRODUODENOSCOPY (EGD) WITH PROPOFOL;  Surgeon: Laurence Spates, MD;  Location: [facility] ENDOSCOPY;  Service: Gastroenterology;  Laterality: N/A;  . ESOPHAGOGASTRODUODENOSCOPY (EGD) WITH PROPOFOL N/A 01/02/2020   Procedure: ESOPHAGOGASTRODUODENOSCOPY (EGD) WITH PROPOFOL;  Surgeon: Milus Banister, MD;  Location: [facility] ENDOSCOPY;  Service: Endoscopy;  Laterality: N/A;  . ESOPHAGOGASTRODUODENOSCOPY (EGD) WITH PROPOFOL N/A 10/25/2020   Procedure: ESOPHAGOGASTRODUODENOSCOPY (EGD) WITH PROPOFOL;  Surgeon: Rush Landmark Telford Nab., MD;  Location: [place];  Service: Gastroenterology;  Laterality: N/A;  . EUS N/A 01/02/2020   Procedure: UPPER ENDOSCOPIC ULTRASOUND (EUS) RADIAL;  Surgeon: Milus Banister, MD;  Location: [facility] ENDOSCOPY;  Service: Endoscopy;  Laterality: N/A;  . EYE SURGERY Left 1990's   "result of trauma"   . FACIAL FRACTURE SURGERY Left 1990's   "result of trauma"   . FLEXIBLE SIGMOIDOSCOPY N/A 10/25/2020   Procedure: FLEXIBLE SIGMOIDOSCOPY;  Surgeon: Rush Landmark Telford Nab., MD;  Location: [facility];  Service: Gastroenterology;  Laterality: N/A;  . FRACTURE SURGERY    . HEMOSTASIS CLIP PLACEMENT  10/25/2020   Procedure: HEMOSTASIS CLIP PLACEMENT;  Surgeon: Irving Copas., MD;  Location: [place];  Service: Gastroenterology;;  . HERNIA REPAIR    . LEFT HEART CATHETERIZATION WITH CORONARY  ANGIOGRAM Right 03/07/2013   Procedure: LEFT HEART CATHETERIZATION WITH CORONARY ANGIOGRAM;  Surgeon: [name], MD;  Location: [facility] CATH LAB;  Service: Cardiovascular;  Laterality: Right;  . UMBILICAL HERNIA REPAIR    . UPPER GASTROINTESTINAL ENDOSCOPY         Family History  Problem Relation Age of Onset  . Hypertension Mother   . Cirrhosis Father   . Alcoholism Father   . Hypertension Father   . Melanoma Father   . Hypertension Other   . Coronary artery disease Other     Social History   Tobacco Use  . Smoking status: Every Day    Packs/day: 1.00    Years: 36.00    Pack years: 36.00    Types: Cigarettes, E-cigarettes  . Smokeless tobacco: Never  Vaping Use  . Vaping Use: Former  Substance Use Topics  . Alcohol use: Yes    Comment: last drink 8 days ago  . Drug use: Yes    Types: Marijuana, Cocaine    Comment: daily marijuana use; last cocaine use about 3 months ago    Home Medications Prior to Admission medications   Medication Sig Start Date End Date Taking? Authorizing Provider  HYDROmorphone (DILAUDID) 4 MG tablet Take 1 tablet (4 mg total) by mouth every 6 (six) hours as needed for severe pain. 11/10/20  Yes Milton Ferguson, MD  acetaminophen (TYLENOL) 500 MG tablet Take 1,000 mg by mouth every 6 (six) hours as needed for moderate pain or headache.    [provider]  albuterol (VENTOLIN HFA) 108 (90 Base) MCG/ACT inhaler Inhale 2 puffs into the lungs every 4 (four) hours as needed for wheezing or shortness of breath. 11/12/19   [name], DO  cholecalciferol (VITAMIN D) 25 MCG tablet Take 1 tablet (1,000 Units total) by mouth daily. 10/30/20   [name], MD  fluticasone furoate-vilanterol (BREO ELLIPTA) 200-25 MCG/INH AEPB Inhale 1 puff into the lungs daily. 11/12/19   [name], DO  folic acid (FOLVITE) 1 MG tablet Take 1 tablet (1 mg total) by mouth daily. 11/26/17   [name], MD  lidocaine (XYLOCAINE) 2 % solution Use as directed 15  mLs in the mouth or throat every 6 (six) hours as needed for mouth pain. 05/25/20   [name], MD  lipase/protease/amylase (CREON) 36000 UNITS CPEP capsule Take 2 capsules (72,000 Units total) by mouth 3 (three) times daily with meals. For pancreatitis 11/05/19   [name], [name], MD  metoprolol (TOPROL-XL) 200 MG 24 hr tablet Take 200 mg by mouth daily. 10/29/20   [provider]  Multiple Vitamin (MULTIVITAMIN WITH MINERALS) TABS tablet Take 1 tablet by mouth daily. 09/06/17   Bonnell Public, MD  omeprazole (PRILOSEC) 20 MG capsule Take 20 mg by mouth daily.    [provider]  ondansetron (ZOFRAN-ODT) 4 MG disintegrating tablet Take 4 mg by mouth 2 (two) times daily as needed for vomiting or nausea. 10/20/20   [provider]  sertraline (ZOLOFT) 100 MG tablet Take 1 tablet (100 mg total) by mouth daily. 03/27/20   [name], MD  sucralfate (CARAFATE) 1 g tablet Take 1 tablet (1 g total) by mouth 4 (four) times daily -  with meals and at bedtime. 05/23/20   Kerin Perna, NP  thiamine 100 MG tablet Take 1 tablet (100 mg total) by mouth daily. 10/16/18   [name], MD  amitriptyline (ELAVIL) 25 MG tablet Take 1 tablet (25 mg total) by mouth at bedtime. Patient not taking: Reported on 08/08/2019 10/15/18 08/08/19  [name], MD  gabapentin (NEURONTIN) 100 MG capsule Take 1 capsule (100 mg total) by mouth 2 (two) times daily. Patient not taking: Reported on 08/08/2019 10/15/18 08/08/19  [name], MD  promethazine (PHENERGAN) 25 MG tablet Take 1 tablet (25 mg total) by mouth every 6 (six) hours as needed for nausea or vomiting. Patient not taking: Reported on 08/08/2019 03/02/19 08/08/19  Kinnie Feil, PA-C    Allergies    Robaxin [methocarbamol], Aspirin, Shellfish-derived products, Trazodone and nefazodone, Adhesive [tape], Latex, Toradol [ketorolac tromethamine], Contrast media [iodinated diagnostic agents], and Reglan  [metoclopramide]  Review of Systems   Review of Systems  Constitutional:  Negative for appetite change and fatigue.  HENT:  Negative for congestion, ear discharge and sinus pressure.   Eyes:  Negative for discharge.  Respiratory:  Negative for cough.   Cardiovascular:  Positive for chest pain.  Gastrointestinal:  Positive for abdominal pain. Negative for diarrhea.  Genitourinary:  Negative for frequency and hematuria.  Musculoskeletal:  Negative for back pain.  Skin:  Negative for rash.  Neurological:  Negative for seizures and headaches.  Psychiatric/Behavioral:  Negative for hallucinations.    Physical Exam Updated Vital Signs BP (!) 138/96   Pulse 94   Temp 98.4 F (36.9 C) (Oral)   Resp [name])  24   Ht 5\' 8"  (1.727 m)   Wt 56 kg   SpO2 98%   BMI 18.77 kg/m   Physical Exam Vitals and nursing Moran reviewed.  Constitutional:      Appearance: He is well-developed.  HENT:     Head: Normocephalic.     Nose: Nose normal.  Eyes:     General: No scleral icterus.    Conjunctiva/sclera: Conjunctivae normal.  Neck:     Thyroid: No thyromegaly.  Cardiovascular:     Rate and Rhythm: Normal rate and regular rhythm.     Heart sounds: No murmur heard.   No friction rub. No gallop.  Pulmonary:     Breath sounds: No stridor. No wheezing or rales.  Chest:     Chest wall: No tenderness.  Abdominal:     General: There is no distension.     Tenderness: There is no abdominal tenderness. There is no rebound.  Musculoskeletal:        General: Normal range of motion.     Cervical back: Neck supple.  Lymphadenopathy:     Cervical: No cervical adenopathy.  Skin:    Findings: No erythema or rash.  Neurological:     Mental Status: He is alert and oriented to person, place, and time.     Motor: No abnormal muscle tone.     Coordination: Coordination normal.  Psychiatric:        Behavior: Behavior normal.    ED Results / Procedures / Treatments   Labs (all labs ordered are listed,  but only abnormal results are displayed) Labs Reviewed  COMPREHENSIVE METABOLIC PANEL - Abnormal; Notable for the following components:      Result Value   Calcium 8.8 (*)    All other components within normal limits  CBC WITH DIFFERENTIAL/PLATELET - Abnormal; Notable for the following components:   WBC 29.2 (*)    RBC 3.42 (*)    Hemoglobin 10.9 (*)    HCT 34.0 (*)    RDW 15.8 (*)    Neutro Abs 25.2 (*)    Monocytes Absolute 2.3 (*)    Abs Immature Granulocytes 0.35 (*)    All other components within normal limits  LIPASE, BLOOD  OCCULT BLOOD X 1 CARD TO LAB, STOOL  TROPONIN I (HIGH SENSITIVITY)  TROPONIN I (HIGH SENSITIVITY)    EKG None  Radiology CT ABDOMEN PELVIS WO CONTRAST  Result Date: 11/10/2020 CLINICAL DATA:  Pancreatitis and GI bleeding ulcer repair two weeks ago Epigastric pain today EXAM: CT ABDOMEN AND PELVIS WITHOUT CONTRAST TECHNIQUE: Multidetector CT imaging of the abdomen and pelvis was performed following the standard protocol without IV contrast. COMPARISON:  CT abdomen pelvis 10/30/2020 FINDINGS: Lower chest: Unremarkable. Hepatobiliary: No focal liver abnormality. No gallstones, gallbladder wall thickening, or pericholecystic fluid. No biliary dilatation. Pancreas: Stable to slightly increased pancreatic parenchyma hazy contour and fat stranding. Similar-appearing pancreatic tail 4.2 x 4.1 cm cystic lesion as well as several smaller lesions distally consistent with a combination of foci of duct dilatation and intraparenchymal pseudocysts that are better evaluated on prior MRI in 2021. There is redemonstration of coarse calcifications as well as similar-appearing cystic lesions. There is a region of both cysts and calcifications within the pancreatic uncinate process that appears similar compared to 10/23/2020. Spleen: Normal in size without focal abnormality. Adrenals/Urinary Tract: No adrenal nodule bilaterally. No nephrolithiasis, no hydronephrosis, and no  contour-deforming renal mass. No ureterolithiasis or hydroureter. The urinary bladder is unremarkable. Stomach/Bowel: Stomach is within normal  limits. No evidence of bowel wall thickening or dilatation. Appendix appears normal. Vascular/Lymphatic: No abdominal aorta or iliac aneurysm. At least moderate atherosclerotic plaque of the aorta and its branches. No abdominal, pelvic, or inguinal lymphadenopathy. Reproductive: Prostate is unremarkable. Other: No intraperitoneal free fluid. No intraperitoneal free gas. No organized fluid collection. Musculoskeletal: No abdominal wall hernia or abnormality. No suspicious lytic or blastic osseous lesions. No acute displaced fracture. Degenerative changes of the L5-S1 level. IMPRESSION: 1. Acute on chronic pancreatitis with grossly similar appearance (limited evaluation with noncontrast study) of known regions of pancreatic duct dilatation/pseudocysts with the largest measuring 4.2 x 4.1 cm along the tail of the pancreas. Consider MR pancreatic protocol for further evaluation. 2. Aortic Atherosclerosis (ICD10-I70.0). Electronically Signed   By: Iven Finn M.D.   On: 11/10/2020 19:08   DG Chest 2 View  Result Date: 11/09/2020 CLINICAL DATA:  Chest pain and shortness of breath EXAM: CHEST - 2 VIEW COMPARISON:  Chest radiograph 10/23/2020 FINDINGS: Mild chronic hyperinflation and bronchial thickening.The cardiomediastinal contours are normal. Pulmonary vasculature is normal. No consolidation, pleural effusion, or pneumothorax. No acute osseous abnormalities are seen. IMPRESSION: Mild chronic hyperinflation and bronchial thickening suggesting COPD. No acute abnormality. Electronically Signed   By: Keith Rake M.D.   On: 11/09/2020 17:10    Procedures Procedures   Medications Ordered in ED Medications  alum & mag hydroxide-simeth (MAALOX/MYLANTA) 200-200-20 MG/5ML suspension 30 mL (has no administration in time range)    And  lidocaine (XYLOCAINE) 2 % viscous  mouth solution 15 mL (has no administration in time range)  ondansetron (ZOFRAN-ODT) disintegrating tablet 4 mg (4 mg Oral Given 11/10/20 1457)  sodium chloride 0.9 % bolus 1,000 mL (0 mLs Intravenous Stopped 11/10/20 1820)  pantoprazole (PROTONIX) injection 40 mg (40 mg Intravenous Given 11/10/20 1643)  HYDROmorphone (DILAUDID) injection 1 mg (1 mg Intravenous Given 11/10/20 1644)  ondansetron (ZOFRAN-ODT) disintegrating tablet 4 mg (4 mg Oral Given 11/10/20 1644)  HYDROmorphone (DILAUDID) injection 0.5 mg (0.5 mg Intravenous Given 11/10/20 1821)  LORazepam (ATIVAN) injection 0.5 mg (0.5 mg Intravenous Given 11/10/20 1822)    ED Course  I have reviewed the triage vital signs and the nursing notes.  Pertinent labs & imaging results that were available during my care of the patient were reviewed by me and considered in my medical decision making (see chart for details).    MDM Rules/Calculators/A&P                          Patient with pancreatitis.  Patient improved with pain medicines.  He will be sent home with Dilaudid pills instead and follow-up as instructed.  He is to return if problem Final Clinical Impression(s) / ED Diagnoses Final diagnoses:  Pain of upper abdomen    Rx / DC Orders ED Discharge Orders          Ordered    HYDROmorphone (DILAUDID) 4 MG tablet  Every 6 hours PRN        11/10/20 1929             Milton Ferguson, MD 11/10/20 1933

## 2020-11-10 NOTE — ED Triage Notes (Addendum)
Centralized chest pain radiation to neck and elbow after walking down the road 6min PTA. Also complaining of pain to LUQ. Pt was at [facility] prior to coming here

## 2020-11-10 NOTE — Discharge Instructions (Addendum)
Follow-up as instructed from the last admission to the hospital.  Return if problems

## 2020-11-10 NOTE — ED Triage Notes (Signed)
Patient arrived by [facility] with complaint of ongoing chronic abdominal pain. Patient seen yesterday for same and states that the oxycodone is not working. Patient is alert and oriented. NAD

## 2020-11-10 NOTE — ED Notes (Signed)
This nurse saw a bottle of oxycodone in patient bag that was filled on 11/04/20, patient had 150 tablets, there are less than 8 tablets in the bottle at this time.  [place], EDP made aware.

## 2020-11-11 MED ORDER — LIDOCAINE VISCOUS HCL 2 % MT SOLN
15.0000 mL | Freq: Once | OROMUCOSAL | Status: AC
  Administered 2020-11-11: 15 mL via ORAL
  Filled 2020-11-11: qty 15

## 2020-11-11 MED ORDER — ALUM & MAG HYDROXIDE-SIMETH 200-200-20 MG/5ML PO SUSP
30.0000 mL | Freq: Once | ORAL | Status: AC
  Administered 2020-11-11: 30 mL via ORAL
  Filled 2020-11-11: qty 30

## 2020-11-11 NOTE — ED Notes (Signed)
Patient at nurses station requesting food and drink.

## 2020-11-11 NOTE — ED Notes (Signed)
Pt discharged from this ED in stable condition at this time. All discharge instructions and follow up care reviewed with pt with no further questions at this time. Pt ambulatory with steady gait, clear speech.  

## 2020-11-11 NOTE — ED Provider Notes (Signed)
[facility] DEPT Provider Note: Georgena Spurling, MD, FACEP  CSN: [id number] MRN: [id number] ARRIVAL: 11/10/20 at 2234 ROOM: [place]  Abdominal Pain   HISTORY OF PRESENT ILLNESS  11/11/20 3:43 AM [name] is a 50 y.o. male with chronic abdominal pain and frequent visits to the ED for same.  He was seen in this ED yesterday and had an extensive work-up that showed acute on chronic pancreatitis and was discharged on Dilaudid 4 mg tablets, #20.  He was discharged.  He then checked into the [facility], ED where he left without being seen.  He then checked it back into this ED.  He states his pain is in his left upper quadrant and points to 3 locations where it hurts.  He states the pain is consistent with his gastritis/ulcer disease.  He rates it as an 8 out of 10.  It is worse when drinking or eating.  He is already on Carafate and Protonix.  He states his current medications are not adequately treating his pain and he is requesting a GI cocktail and IV Dilaudid.   Past Medical History:  Diagnosis Date   Alcoholism /alcohol abuse    Anemia    Anxiety    Arthritis    "knees; arms; elbows" (03/26/2015)   Asthma    Bipolar disorder (HCC)    Chronic bronchitis (HCC)    Chronic lower back pain    Chronic pancreatitis ([place])    Cocaine abuse ([place])    Depression    Family history of adverse reaction to anesthesia    "grandmother gets confused"   Femoral condyle fracture ([place]) 03/08/2014   left medial/notes 03/09/2014   GERD (gastroesophageal reflux disease)    H/O hiatal hernia    H/O suicide attempt 10/2012   High cholesterol    History of blood transfusion 10/2012   "when I tried to commit suicide"   History of stomach ulcers    Hypertension    Marijuana abuse, continuous    Migraine    "a few times/year" (03/26/2015)   Pneumonia 1990's X 3   PTSD (post-traumatic stress disorder)    Sickle cell trait ([place])    WPW (Wolff-Parkinson-White  syndrome)    [name] 03/06/2013    Past Surgical History:  Procedure Laterality Date   BIOPSY  11/25/2017   Procedure: BIOPSY;  Surgeon: Arta Silence, MD;  Location: [place];  Service: Endoscopy;;   BIOPSY  10/14/2018   Procedure: BIOPSY;  Surgeon: Arta Silence, MD;  Location: [place];  Service: Endoscopy;;   CARDIAC CATHETERIZATION     CYST ENTEROSTOMY  01/02/2020   Procedure: CYST ASPIRATION;  Surgeon: Milus Banister, MD;  Location: [facility];  Service: Endoscopy;;   ESOPHAGOGASTRODUODENOSCOPY (EGD) WITH PROPOFOL N/A 11/25/2017   Procedure: ESOPHAGOGASTRODUODENOSCOPY (EGD) WITH PROPOFOL;  Surgeon: Arta Silence, MD;  Location: [place];  Service: Endoscopy;  Laterality: N/A;   ESOPHAGOGASTRODUODENOSCOPY (EGD) WITH PROPOFOL Left 10/14/2018   Procedure: ESOPHAGOGASTRODUODENOSCOPY (EGD) WITH PROPOFOL;  Surgeon: Arta Silence, MD;  Location: [facility];  Service: Endoscopy;  Laterality: Left;   ESOPHAGOGASTRODUODENOSCOPY (EGD) WITH PROPOFOL N/A 11/14/2018   Procedure: ESOPHAGOGASTRODUODENOSCOPY (EGD) WITH PROPOFOL;  Surgeon: Laurence Spates, MD;  Location: [facility];  Service: Gastroenterology;  Laterality: N/A;   ESOPHAGOGASTRODUODENOSCOPY (EGD) WITH PROPOFOL N/A 01/02/2020   Procedure: ESOPHAGOGASTRODUODENOSCOPY (EGD) WITH PROPOFOL;  Surgeon: Milus Banister, MD;  Location: [facility];  Service: Endoscopy;  Laterality: N/A;   ESOPHAGOGASTRODUODENOSCOPY (EGD) WITH PROPOFOL N/A 10/25/2020  Procedure: ESOPHAGOGASTRODUODENOSCOPY (EGD) WITH PROPOFOL;  Surgeon: Rush Landmark Telford Nab., MD;  Location: [place];  Service: Gastroenterology;  Laterality: N/A;   EUS N/A 01/02/2020   Procedure: UPPER ENDOSCOPIC ULTRASOUND (EUS) RADIAL;  Surgeon: Milus Banister, MD;  Location: [facility];  Service: Endoscopy;  Laterality: N/A;   EYE SURGERY Left 1990's   "result of trauma"    FACIAL FRACTURE SURGERY Left 1990's   "result of trauma"    FLEXIBLE SIGMOIDOSCOPY N/A  10/25/2020   Procedure: FLEXIBLE SIGMOIDOSCOPY;  Surgeon: Irving Copas., MD;  Location: [place];  Service: Gastroenterology;  Laterality: N/A;   FRACTURE SURGERY     HEMOSTASIS CLIP PLACEMENT  10/25/2020   Procedure: HEMOSTASIS CLIP PLACEMENT;  Surgeon: Irving Copas., MD;  Location: [place];  Service: Gastroenterology;;   HERNIA REPAIR     LEFT HEART CATHETERIZATION WITH CORONARY ANGIOGRAM Right 03/07/2013   Procedure: LEFT HEART CATHETERIZATION WITH CORONARY ANGIOGRAM;  Surgeon: [name], MD;  Location: [place] CATH LAB;  Service: Cardiovascular;  Laterality: Right;   UMBILICAL HERNIA REPAIR     UPPER GASTROINTESTINAL ENDOSCOPY      Family History  Problem Relation Age of Onset   Hypertension Mother    Cirrhosis Father    Alcoholism Father    Hypertension Father    Melanoma Father    Hypertension Other    Coronary artery disease Other     Social History   Tobacco Use   Smoking status: Every Day    Packs/day: 1.00    Years: 36.00    Pack years: 36.00    Types: Cigarettes, E-cigarettes   Smokeless tobacco: Never  Vaping Use   Vaping Use: Former  Substance Use Topics   Alcohol use: Yes    Comment: last drink 8 days ago   Drug use: Yes    Types: Marijuana, Cocaine    Comment: daily marijuana use; last cocaine use about 3 months ago    Prior to Admission medications   Medication Sig Start Date End Date Taking? Authorizing Provider  acetaminophen (TYLENOL) 500 MG tablet Take 1,000 mg by mouth every 6 (six) hours as needed for moderate pain or headache.    [provider]  albuterol (VENTOLIN HFA) 108 (90 Base) MCG/ACT inhaler Inhale 2 puffs into the lungs every 4 (four) hours as needed for wheezing or shortness of breath. 11/12/19   [name], DO  cholecalciferol (VITAMIN D) 25 MCG tablet Take 1 tablet (1,000 Units total) by mouth daily. 10/30/20   [name], MD  fluticasone furoate-vilanterol (BREO ELLIPTA) 200-25 MCG/INH AEPB  Inhale 1 puff into the lungs daily. 11/12/19   [name], DO  folic acid (FOLVITE) 1 MG tablet Take 1 tablet (1 mg total) by mouth daily. 11/26/17   [name], MD  HYDROmorphone (DILAUDID) 4 MG tablet Take 1 tablet (4 mg total) by mouth every 6 (six) hours as needed for severe pain. 11/10/20   Milton Ferguson, MD  lidocaine (XYLOCAINE) 2 % solution Use as directed 15 mLs in the mouth or throat every 6 (six) hours as needed for mouth pain. 05/25/20   [name], MD  lipase/protease/amylase (CREON) 36000 UNITS CPEP capsule Take 2 capsules (72,000 Units total) by mouth 3 (three) times daily with meals. For pancreatitis 11/05/19   [name], [name], MD  metoprolol (TOPROL-XL) 200 MG 24 hr tablet Take 200 mg by mouth daily. 10/29/20   [provider]  Multiple Vitamin (MULTIVITAMIN WITH MINERALS) TABS tablet Take 1  tablet by mouth daily. 09/06/17   Bonnell Public, MD  omeprazole (PRILOSEC) 20 MG capsule Take 20 mg by mouth daily.    [provider]  ondansetron (ZOFRAN-ODT) 4 MG disintegrating tablet Take 4 mg by mouth 2 (two) times daily as needed for vomiting or nausea. 10/20/20   [provider]  sertraline (ZOLOFT) 100 MG tablet Take 1 tablet (100 mg total) by mouth daily. 03/27/20   [name], MD  sucralfate (CARAFATE) 1 g tablet Take 1 tablet (1 g total) by mouth 4 (four) times daily -  with meals and at bedtime. 05/23/20   Kerin Perna, NP  thiamine 100 MG tablet Take 1 tablet (100 mg total) by mouth daily. 10/16/18   [name], MD  amitriptyline (ELAVIL) 25 MG tablet Take 1 tablet (25 mg total) by mouth at bedtime. Patient not taking: Reported on 08/08/2019 10/15/18 08/08/19  [name], MD  gabapentin (NEURONTIN) 100 MG capsule Take 1 capsule (100 mg total) by mouth 2 (two) times daily. Patient not taking: Reported on 08/08/2019 10/15/18 08/08/19  [name], MD  promethazine (PHENERGAN) 25 MG tablet Take 1 tablet (25 mg total) by mouth every  6 (six) hours as needed for nausea or vomiting. Patient not taking: Reported on 08/08/2019 03/02/19 08/08/19  Kinnie Feil, PA-C    Allergies Robaxin [methocarbamol], Aspirin, Shellfish-derived products, Trazodone and nefazodone, Adhesive [tape], Latex, Toradol [ketorolac tromethamine], Contrast media [iodinated diagnostic agents], and Reglan [metoclopramide]   REVIEW OF SYSTEMS  Negative except as noted here or in the History of Present Illness.   PHYSICAL EXAMINATION  Initial Vital Signs Blood pressure 129/87, pulse 92, temperature 99.1 F (37.3 C), temperature source Oral, resp. rate 18, SpO2 99 %.  Examination General: Well-developed, well-nourished male in no acute distress; appearance consistent with age of record HENT: normocephalic; atraumatic Eyes: pupils equal, round and reactive to light; extraocular muscles intact Neck: supple Heart: regular rate and rhythm Lungs: clear to auscultation bilaterally Abdomen: soft; nondistended; left upper quadrant tenderness; bowel sounds present Extremities: No deformity; full range of motion; pulses normal Neurologic: Awake, alert; motor function intact in all extremities and symmetric; no facial droop Skin: Warm and dry Psychiatric: Normal mood and affect   RESULTS  Summary of this visit's results, reviewed and interpreted by myself:   EKG Interpretation  Date/Time:    Ventricular Rate:    PR Interval:    QRS Duration:   QT Interval:    QTC Calculation:   R Axis:     Text Interpretation:          Laboratory Studies: Results for orders placed or performed during the hospital encounter of 11/10/20 (from the past 24 hour(s))  Comprehensive metabolic panel     Status: Abnormal   Collection Time: 11/10/20  2:57 PM  Result Value Ref Range   Sodium 135 135 - 145 mmol/L   Potassium 4.0 3.5 - 5.1 mmol/L   Chloride 103 98 - 111 mmol/L   CO2 22 22 - 32 mmol/L   Glucose, Bld 99 70 - 99 mg/dL   BUN 8 6 - 20 mg/dL    Creatinine, Ser 0.61 0.61 - 1.24 mg/dL   Calcium 8.8 (L) 8.9 - 10.3 mg/dL   Total Protein 7.7 6.5 - 8.1 g/dL   Albumin 3.8 3.5 - 5.0 g/dL   AST 18 15 - 41 U/L   ALT 14 0 - 44 U/L   Alkaline Phosphatase 95 38 - 126 U/L  Total Bilirubin 0.6 0.3 - 1.2 mg/dL   GFR, Estimated >60 >60 mL/min   Anion gap 10 5 - 15  Troponin I (High Sensitivity)     Status: None   Collection Time: 11/10/20  2:57 PM  Result Value Ref Range   Troponin I (High Sensitivity) 2 <18 ng/L  Lipase, blood     Status: None   Collection Time: 11/10/20  2:57 PM  Result Value Ref Range   Lipase 26 11 - 51 U/L  CBC with Differential     Status: Abnormal   Collection Time: 11/10/20  2:57 PM  Result Value Ref Range   WBC 29.2 (H) 4.0 - 10.5 K/uL   RBC 3.42 (L) 4.22 - 5.81 MIL/uL   Hemoglobin 10.9 (L) 13.0 - 17.0 g/dL   HCT 34.0 (L) 39.0 - 52.0 %   MCV 99.4 80.0 - 100.0 fL   MCH 31.9 26.0 - 34.0 pg   MCHC 32.1 30.0 - 36.0 g/dL   RDW 15.8 (H) 11.5 - 15.5 %   Platelets 358 150 - 400 K/uL   nRBC 0.0 0.0 - 0.2 %   Neutrophils Relative % 87 %   Neutro Abs 25.2 (H) 1.7 - 7.7 K/uL   Lymphocytes Relative 4 %   Lymphs Abs 1.3 0.7 - 4.0 K/uL   Monocytes Relative 8 %   Monocytes Absolute 2.3 (H) 0.1 - 1.0 K/uL   Eosinophils Relative 0 %   Eosinophils Absolute 0.1 0.0 - 0.5 K/uL   Basophils Relative 0 %   Basophils Absolute 0.0 0.0 - 0.1 K/uL   Immature Granulocytes 1 %   Abs Immature Granulocytes 0.35 (H) 0.00 - 0.07 K/uL  Troponin I (High Sensitivity)     Status: None   Collection Time: 11/10/20  4:52 PM  Result Value Ref Range   Troponin I (High Sensitivity) 2 <18 ng/L   Imaging Studies: CT ABDOMEN PELVIS WO CONTRAST  Result Date: 11/10/2020 CLINICAL DATA:  Pancreatitis and GI bleeding ulcer repair two weeks ago Epigastric pain today EXAM: CT ABDOMEN AND PELVIS WITHOUT CONTRAST TECHNIQUE: Multidetector CT imaging of the abdomen and pelvis was performed following the standard protocol without IV contrast. COMPARISON:   CT abdomen pelvis 10/30/2020 FINDINGS: Lower chest: Unremarkable. Hepatobiliary: No focal liver abnormality. No gallstones, gallbladder wall thickening, or pericholecystic fluid. No biliary dilatation. Pancreas: Stable to slightly increased pancreatic parenchyma hazy contour and fat stranding. Similar-appearing pancreatic tail 4.2 x 4.1 cm cystic lesion as well as several smaller lesions distally consistent with a combination of foci of duct dilatation and intraparenchymal pseudocysts that are better evaluated on prior MRI in 2021. There is redemonstration of coarse calcifications as well as similar-appearing cystic lesions. There is a region of both cysts and calcifications within the pancreatic uncinate process that appears similar compared to 10/23/2020. Spleen: Normal in size without focal abnormality. Adrenals/Urinary Tract: No adrenal nodule bilaterally. No nephrolithiasis, no hydronephrosis, and no contour-deforming renal mass. No ureterolithiasis or hydroureter. The urinary bladder is unremarkable. Stomach/Bowel: Stomach is within normal limits. No evidence of bowel wall thickening or dilatation. Appendix appears normal. Vascular/Lymphatic: No abdominal aorta or iliac aneurysm. At least moderate atherosclerotic plaque of the aorta and its branches. No abdominal, pelvic, or inguinal lymphadenopathy. Reproductive: Prostate is unremarkable. Other: No intraperitoneal free fluid. No intraperitoneal free gas. No organized fluid collection. Musculoskeletal: No abdominal wall hernia or abnormality. No suspicious lytic or blastic osseous lesions. No acute displaced fracture. Degenerative changes of the L5-S1 level. IMPRESSION: 1. Acute on chronic  pancreatitis with grossly similar appearance (limited evaluation with noncontrast study) of known regions of pancreatic duct dilatation/pseudocysts with the largest measuring 4.2 x 4.1 cm along the tail of the pancreas. Consider MR pancreatic protocol for further evaluation.  2. Aortic Atherosclerosis (ICD10-I70.0). Electronically Signed   By: Iven Finn M.D.   On: 11/10/2020 19:08   DG Chest 2 View  Result Date: 11/09/2020 CLINICAL DATA:  Chest pain and shortness of breath EXAM: CHEST - 2 VIEW COMPARISON:  Chest radiograph 10/23/2020 FINDINGS: Mild chronic hyperinflation and bronchial thickening.The cardiomediastinal contours are normal. Pulmonary vasculature is normal. No consolidation, pleural effusion, or pneumothorax. No acute osseous abnormalities are seen. IMPRESSION: Mild chronic hyperinflation and bronchial thickening suggesting COPD. No acute abnormality. Electronically Signed   By: Keith Rake M.D.   On: 11/09/2020 17:10    ED COURSE and MDM  Nursing notes, initial and subsequent vitals signs, including pulse oximetry, reviewed and interpreted by myself.  Vitals:   11/10/20 2242 11/11/20 0350  BP: 129/87 (!) 134/97  Pulse: 92 80  Resp: 18 18  Temp: 99.1 F (37.3 C)   TempSrc: Oral   SpO2: 99% 95%   Medications  alum & mag hydroxide-simeth (MAALOX/MYLANTA) 200-200-20 MG/5ML suspension 30 mL (has no administration in time range)    And  lidocaine (XYLOCAINE) 2 % viscous mouth solution 15 mL (has no administration in time range)   3:53 AM In accordance with the patient's care plan he is to be discharged without narcotics if his pain is consistent with his chronic abdominal pain.  As noted above the patient has had a thorough evaluation and is on appropriate medications for his pain and gastritis/PUD.   PROCEDURES  Procedures   ED DIAGNOSES     ICD-10-CM   1. Chronic abdominal pain  R10.9    G89.29          Keryn Nessler, Jenny Reichmann, MD 11/11/20 [phone]

## 2020-11-13 ENCOUNTER — Telehealth: Payer: Self-pay | Admitting: Gastroenterology

## 2020-11-13 NOTE — Telephone Encounter (Signed)
Unfortunate situation. He just had an EGD which looked okay but admitted with pain and evaluated by our team last month. Unfortunately after 5PM on a Friday his only option is the ED if he is feeling that poorly.

## 2020-11-13 NOTE — Telephone Encounter (Signed)
Pt states he was in the hospital a few weeks ago for a bleeding ulcer. States he is still having a lot of abd pain and is not able to eat or drink anything. Discussed with pt that is he cannot eat or drink anything he needs to go to the ER as he will get dehydrated. Pt wanted to know if we could admit him. Let him know he will need to be seen by the hospitalist and they will decide if he needs to be admitted and then call for a GI consult if they feel he needs to be seen by GI. Pt verbalized understanding. Dr. Havery Moros notified.

## 2020-11-13 NOTE — Telephone Encounter (Signed)
Patient calling experiencing abd pain//Pt wants to know how to treat sxs.. Plz advise  Thanks

## 2020-11-14 ENCOUNTER — Other Ambulatory Visit: Payer: Self-pay

## 2020-11-14 ENCOUNTER — Inpatient Hospital Stay (HOSPITAL_COMMUNITY)
Admission: EM | Admit: 2020-11-14 | Discharge: 2020-11-21 | DRG: 438 | Disposition: A | Discharge status: Home / Self Care | Payer: 59 | Attending: Internal Medicine | Admitting: Internal Medicine

## 2020-11-14 ENCOUNTER — Encounter (HOSPITAL_COMMUNITY): Payer: Self-pay | Admitting: *Deleted

## 2020-11-14 DIAGNOSIS — Z91041 Radiographic dye allergy status: Secondary | ICD-10-CM

## 2020-11-14 DIAGNOSIS — D638 Anemia in other chronic diseases classified elsewhere: Secondary | ICD-10-CM | POA: Diagnosis present

## 2020-11-14 DIAGNOSIS — E861 Hypovolemia: Secondary | ICD-10-CM | POA: Diagnosis present

## 2020-11-14 DIAGNOSIS — K861 Other chronic pancreatitis: Secondary | ICD-10-CM | POA: Diagnosis present

## 2020-11-14 DIAGNOSIS — R339 Retention of urine, unspecified: Secondary | ICD-10-CM

## 2020-11-14 DIAGNOSIS — J449 Chronic obstructive pulmonary disease, unspecified: Secondary | ICD-10-CM | POA: Diagnosis present

## 2020-11-14 DIAGNOSIS — Z20822 Contact with and (suspected) exposure to covid-19: Secondary | ICD-10-CM | POA: Diagnosis present

## 2020-11-14 DIAGNOSIS — E43 Unspecified severe protein-calorie malnutrition: Secondary | ICD-10-CM | POA: Insufficient documentation

## 2020-11-14 DIAGNOSIS — Z888 Allergy status to other drugs, medicaments and biological substances status: Secondary | ICD-10-CM

## 2020-11-14 DIAGNOSIS — F332 Major depressive disorder, recurrent severe without psychotic features: Secondary | ICD-10-CM | POA: Diagnosis present

## 2020-11-14 DIAGNOSIS — Z808 Family history of malignant neoplasm of other organs or systems: Secondary | ICD-10-CM

## 2020-11-14 DIAGNOSIS — F1010 Alcohol abuse, uncomplicated: Secondary | ICD-10-CM | POA: Diagnosis present

## 2020-11-14 DIAGNOSIS — R3915 Urgency of urination: Secondary | ICD-10-CM | POA: Diagnosis present

## 2020-11-14 DIAGNOSIS — D573 Sickle-cell trait: Secondary | ICD-10-CM | POA: Diagnosis present

## 2020-11-14 DIAGNOSIS — K863 Pseudocyst of pancreas: Secondary | ICD-10-CM | POA: Diagnosis present

## 2020-11-14 DIAGNOSIS — K852 Alcohol induced acute pancreatitis without necrosis or infection: Secondary | ICD-10-CM | POA: Diagnosis not present

## 2020-11-14 DIAGNOSIS — Z6822 Body mass index (BMI) 22.0-22.9, adult: Secondary | ICD-10-CM

## 2020-11-14 DIAGNOSIS — K859 Acute pancreatitis without necrosis or infection, unspecified: Secondary | ICD-10-CM | POA: Diagnosis present

## 2020-11-14 DIAGNOSIS — E871 Hypo-osmolality and hyponatremia: Secondary | ICD-10-CM | POA: Diagnosis present

## 2020-11-14 DIAGNOSIS — K862 Cyst of pancreas: Secondary | ICD-10-CM | POA: Diagnosis present

## 2020-11-14 DIAGNOSIS — R300 Dysuria: Secondary | ICD-10-CM | POA: Diagnosis present

## 2020-11-14 DIAGNOSIS — K860 Alcohol-induced chronic pancreatitis: Secondary | ICD-10-CM | POA: Diagnosis present

## 2020-11-14 DIAGNOSIS — Z8711 Personal history of peptic ulcer disease: Secondary | ICD-10-CM

## 2020-11-14 DIAGNOSIS — E876 Hypokalemia: Secondary | ICD-10-CM | POA: Diagnosis present

## 2020-11-14 DIAGNOSIS — Z8249 Family history of ischemic heart disease and other diseases of the circulatory system: Secondary | ICD-10-CM

## 2020-11-14 DIAGNOSIS — I10 Essential (primary) hypertension: Secondary | ICD-10-CM | POA: Diagnosis present

## 2020-11-14 DIAGNOSIS — R338 Other retention of urine: Secondary | ICD-10-CM | POA: Diagnosis present

## 2020-11-14 DIAGNOSIS — K219 Gastro-esophageal reflux disease without esophagitis: Secondary | ICD-10-CM | POA: Diagnosis present

## 2020-11-14 DIAGNOSIS — D72829 Elevated white blood cell count, unspecified: Secondary | ICD-10-CM

## 2020-11-14 DIAGNOSIS — R824 Acetonuria: Secondary | ICD-10-CM | POA: Diagnosis present

## 2020-11-14 DIAGNOSIS — R609 Edema, unspecified: Secondary | ICD-10-CM

## 2020-11-14 LAB — COMPREHENSIVE METABOLIC PANEL
ALT: 12 U/L (ref 0–44)
AST: 18 U/L (ref 15–41)
Albumin: 2.7 g/dL — ABNORMAL LOW (ref 3.5–5.0)
Alkaline Phosphatase: 112 U/L (ref 38–126)
Anion gap: 11 (ref 5–15)
BUN: 5 mg/dL — ABNORMAL LOW (ref 6–20)
CO2: 23 mmol/L (ref 22–32)
Calcium: 8.7 mg/dL — ABNORMAL LOW (ref 8.9–10.3)
Chloride: 97 mmol/L — ABNORMAL LOW (ref 98–111)
Creatinine, Ser: 0.55 mg/dL — ABNORMAL LOW (ref 0.61–1.24)
GFR, Estimated: >60 mL/min (ref >60)
Glucose, Bld: 157 mg/dL — ABNORMAL HIGH (ref 70–99)
Potassium: 3.2 mmol/L — ABNORMAL LOW (ref 3.5–5.1)
Sodium: 131 mmol/L — ABNORMAL LOW (ref 135–145)
Total Bilirubin: 1 mg/dL (ref 0.3–1.2)
Total Protein: 6.8 g/dL (ref 6.5–8.1)

## 2020-11-14 LAB — RESP PANEL BY RT-PCR (FLU A&B, COVID) ARPGX2
Influenza A by PCR: NEGATIVE
Influenza B by PCR: NEGATIVE
SARS Coronavirus 2 by RT PCR: NEGATIVE

## 2020-11-14 LAB — URINALYSIS, ROUTINE W REFLEX MICROSCOPIC
Bilirubin Urine: NEGATIVE
Glucose, UA: NEGATIVE mg/dL
Hgb urine dipstick: NEGATIVE
Ketones, ur: 20 mg/dL — AB
Leukocytes,Ua: NEGATIVE
Nitrite: NEGATIVE
Protein, ur: NEGATIVE mg/dL
Specific Gravity, Urine: 1.019 (ref 1.005–1.030)
pH: 5 (ref 5.0–8.0)

## 2020-11-14 LAB — LIPASE, BLOOD: Lipase: 20 U/L (ref 11–51)

## 2020-11-14 LAB — CBC
HCT: 31.3 % — ABNORMAL LOW (ref 39.0–52.0)
Hemoglobin: 10.4 g/dL — ABNORMAL LOW (ref 13.0–17.0)
MCH: 31.6 pg (ref 26.0–34.0)
MCHC: 33.2 g/dL (ref 30.0–36.0)
MCV: 95.1 fL (ref 80.0–100.0)
Platelets: 378 10*3/uL (ref 150–400)
RBC: 3.29 MIL/uL — ABNORMAL LOW (ref 4.22–5.81)
RDW: 15.4 % (ref 11.5–15.5)
WBC: 14.8 10*3/uL — ABNORMAL HIGH (ref 4.0–10.5)
nRBC: 0 % (ref 0.0–0.2)

## 2020-11-14 MED ORDER — METOPROLOL SUCCINATE ER 50 MG PO TB24
200.0000 mg | ORAL_TABLET | Freq: Every day | ORAL | Status: DC
  Administered 2020-11-14 – 2020-11-21 (×8): 200 mg via ORAL
  Filled 2020-11-14 (×6): qty 4
  Filled 2020-11-14: qty 8
  Filled 2020-11-14 (×2): qty 4

## 2020-11-14 MED ORDER — FLUTICASONE FUROATE-VILANTEROL 200-25 MCG/INH IN AEPB
1.0000 | INHALATION_SPRAY | Freq: Every day | RESPIRATORY_TRACT | Status: DC
  Administered 2020-11-16: 1 via RESPIRATORY_TRACT
  Filled 2020-11-14: qty 28

## 2020-11-14 MED ORDER — ACETAMINOPHEN 500 MG PO TABS
1000.0000 mg | ORAL_TABLET | Freq: Four times a day (QID) | ORAL | Status: DC | PRN
  Administered 2020-11-14 – 2020-11-21 (×7): 1000 mg via ORAL
  Filled 2020-11-14 (×7): qty 2

## 2020-11-14 MED ORDER — ACETAMINOPHEN 650 MG RE SUPP: 650.0000 mg | Freq: Four times a day (QID) | RECTAL | Status: DC | PRN

## 2020-11-14 MED ORDER — LACTATED RINGERS IV SOLN: INTRAVENOUS | Status: DC

## 2020-11-14 MED ORDER — LACTATED RINGERS IV SOLN: INTRAVENOUS | Status: AC

## 2020-11-14 MED ORDER — SUCRALFATE 1 G PO TABS
1.0000 g | ORAL_TABLET | Freq: Three times a day (TID) | ORAL | Status: DC
  Administered 2020-11-14 – 2020-11-21 (×29): 1 g via ORAL
  Filled 2020-11-14 (×32): qty 1

## 2020-11-14 MED ORDER — ADULT MULTIVITAMIN W/MINERALS CH
1.0000 | ORAL_TABLET | Freq: Every day | ORAL | Status: DC
  Administered 2020-11-14 – 2020-11-21 (×8): 1 via ORAL
  Filled 2020-11-14 (×8): qty 1

## 2020-11-14 MED ORDER — ONDANSETRON 4 MG PO TBDP
4.0000 mg | ORAL_TABLET | Freq: Three times a day (TID) | ORAL | Status: DC | PRN
  Administered 2020-11-14 – 2020-11-19 (×2): 4 mg via ORAL
  Filled 2020-11-14 (×2): qty 1

## 2020-11-14 MED ORDER — PANTOPRAZOLE SODIUM 40 MG PO TBEC
40.0000 mg | DELAYED_RELEASE_TABLET | Freq: Every day | ORAL | Status: DC
  Administered 2020-11-14 – 2020-11-21 (×8): 40 mg via ORAL
  Filled 2020-11-14: qty 2
  Filled 2020-11-14 (×7): qty 1

## 2020-11-14 MED ORDER — LIDOCAINE VISCOUS HCL 2 % MT SOLN
15.0000 mL | Freq: Four times a day (QID) | OROMUCOSAL | Status: DC | PRN
  Administered 2020-11-18 – 2020-11-21 (×6): 15 mL via OROMUCOSAL
  Filled 2020-11-14 (×8): qty 15

## 2020-11-14 MED ORDER — ALBUTEROL SULFATE (2.5 MG/3ML) 0.083% IN NEBU: 2.5000 mg | INHALATION_SOLUTION | RESPIRATORY_TRACT | Status: DC | PRN

## 2020-11-14 MED ORDER — HYDROMORPHONE HCL 1 MG/ML IJ SOLN
1.0000 mg | Freq: Once | INTRAMUSCULAR | Status: AC
  Administered 2020-11-14: 1 mg via INTRAVENOUS
  Filled 2020-11-14: qty 1

## 2020-11-14 MED ORDER — ALUM & MAG HYDROXIDE-SIMETH 200-200-20 MG/5ML PO SUSP
30.0000 mL | Freq: Once | ORAL | Status: AC
  Administered 2020-11-14: 30 mL via ORAL
  Filled 2020-11-14: qty 30

## 2020-11-14 MED ORDER — HYDROMORPHONE HCL 1 MG/ML IJ SOLN
0.5000 mg | Freq: Once | INTRAMUSCULAR | Status: AC
  Administered 2020-11-14: 0.5 mg via INTRAVENOUS
  Filled 2020-11-14: qty 0.5

## 2020-11-14 MED ORDER — ONDANSETRON 4 MG PO TBDP
4.0000 mg | ORAL_TABLET | Freq: Two times a day (BID) | ORAL | Status: DC | PRN
  Administered 2020-11-14: 4 mg via ORAL
  Filled 2020-11-14: qty 1

## 2020-11-14 MED ORDER — FAMOTIDINE IN NACL 20-0.9 MG/50ML-% IV SOLN
20.0000 mg | Freq: Once | INTRAVENOUS | Status: AC
  Administered 2020-11-14: 20 mg via INTRAVENOUS
  Filled 2020-11-14: qty 50

## 2020-11-14 MED ORDER — HYDROMORPHONE HCL 1 MG/ML IJ SOLN
1.0000 mg | Freq: Once | INTRAMUSCULAR | Status: DC
Start: 2020-11-14 — End: 2020-11-14
  Filled 2020-11-14: qty 1

## 2020-11-14 MED ORDER — DIPHENHYDRAMINE HCL 50 MG/ML IJ SOLN
25.0000 mg | Freq: Once | INTRAMUSCULAR | Status: AC
  Administered 2020-11-14: 25 mg via INTRAVENOUS
  Filled 2020-11-14: qty 1

## 2020-11-14 MED ORDER — SODIUM CHLORIDE 0.9 % IV BOLUS
1000.0000 mL | Freq: Once | INTRAVENOUS | Status: AC
  Administered 2020-11-14: 1000 mL via INTRAVENOUS

## 2020-11-14 MED ORDER — SERTRALINE HCL 100 MG PO TABS
100.0000 mg | ORAL_TABLET | Freq: Every day | ORAL | Status: DC
  Administered 2020-11-14 – 2020-11-15 (×2): 100 mg via ORAL
  Filled 2020-11-14 (×2): qty 1

## 2020-11-14 MED ORDER — OXYCODONE HCL 5 MG PO TABS
5.0000 mg | ORAL_TABLET | ORAL | Status: DC | PRN
  Administered 2020-11-14 – 2020-11-15 (×4): 5 mg via ORAL
  Filled 2020-11-14 (×4): qty 1

## 2020-11-14 MED ORDER — DROPERIDOL 2.5 MG/ML IJ SOLN
1.2500 mg | Freq: Once | INTRAMUSCULAR | Status: AC
  Administered 2020-11-14: 1.25 mg via INTRAVENOUS
  Filled 2020-11-14: qty 2

## 2020-11-14 MED ORDER — ENOXAPARIN SODIUM 40 MG/0.4ML IJ SOSY
40.0000 mg | PREFILLED_SYRINGE | INTRAMUSCULAR | Status: DC
  Administered 2020-11-14 – 2020-11-21 (×8): 40 mg via SUBCUTANEOUS
  Filled 2020-11-14 (×8): qty 0.4

## 2020-11-14 MED ORDER — PANCRELIPASE (LIP-PROT-AMYL) 36000-114000 UNITS PO CPEP
72000.0000 [IU] | ORAL_CAPSULE | Freq: Three times a day (TID) | ORAL | Status: DC
  Administered 2020-11-14 – 2020-11-21 (×21): 72000 [IU] via ORAL
  Filled 2020-11-14 (×24): qty 2

## 2020-11-14 NOTE — Plan of Care (Signed)
  Problem: Education: Goal: Knowledge of General Education information will improve Description: Including pain rating scale, medication(s)/side effects and non-pharmacologic comfort measures Outcome: Progressing   Problem: Health Behavior/Discharge Planning: Goal: Ability to manage health-related needs will improve Outcome: Progressing   Problem: Clinical Measurements: Goal: Ability to maintain clinical measurements within normal limits will improve Outcome: Progressing   Problem: Activity: Goal: Risk for activity intolerance will decrease Outcome: Progressing   Problem: Safety: Goal: Ability to remain free from injury will improve Outcome: Progressing   Problem: Skin Integrity: Goal: Risk for impaired skin integrity will decrease Outcome: Progressing

## 2020-11-14 NOTE — ED Notes (Signed)
Patient refuses vital signs prior to transport to inpatient unit

## 2020-11-14 NOTE — ED Notes (Signed)
Report attempted to inpatient unit x 1

## 2020-11-14 NOTE — Hospital Course (Addendum)
50 y/o male w/ chronic pancreatitis, gastritis, chronic bronchitis, alcohol use disorder, and polysubstance abuse admitted for acute on chronic pancreatitis shown on 7/5 CT.  Pt went to ED 5 times between 7/5 and 7/9 when he was admitted, leaving ED more than once without speaking to staff.  Provided supportive care throughout admission. Nausea and vomiting subsided in days 2-3, with abdominal pain slowly improving by day 4. Tolerating PO solid food by day 6-7.  Pt developed incomplete bladder voiding on hospital day 4 with PVR of 233mL. F/u w/ [place] prostate, Urine cx, and UA which were normal. Provided in and out cath for 2 days. Pt able to void well after cath removal on Day 8.  Developed electrolyte losses consistent with hypovolemic hyponatremia secondary to GI losses, and responded well to NS challenge.  Dispo follow up: Acute on Chronic Pancreatitis Now able to tolerate PO food. Abdominal pain much more controlled and weaned down to outpatient regimen. Continue Creon w/ meals and Protonix daily.   Urinary retention PVR w/ 238mL in setting of normal size prostate, normal UA and urine culture. Suspect result of IV pain regimen for acute pancreatitis. F/u w/ Pcp for further work up if symptoms continue.  Alcohol misuse disorder Pt was given information to inpatient alcohol rehabilitation programs and began calling these programs as inpatient. He will continue looking for placement.

## 2020-11-14 NOTE — ED Notes (Signed)
Pt refusing to stay on monitor. RN April made aware

## 2020-11-14 NOTE — Progress Notes (Signed)
Call received from April, RN in ED. Pt to be transferred momentarily. VWilliams,RN.

## 2020-11-14 NOTE — ED Provider Notes (Signed)
[facility] EMERGENCY DEPARTMENT Provider Note   CSN: [id number] Arrival date & time: 11/14/20  4098     History Chief Complaint  Patient presents with   Abdominal Pain    [name] is a 50 y.o. male.  50 yo M with a chief complaints of epigastric left-sided abdominal pain.  Described as a burning.  Feels just like his chronic abdominal pain.  Has worsened over the weekend and has not really been able to eat and drink for the last 3 to 4 days.  He has been seen multiple times in the ED recently.  Had a CT scan done 4 days ago that showed acute on chronic pancreatitis without significant change from earlier in the month.  Was admitted at that initial CT scan head and spent a couple days in the hospital without significant improvement of his discomfort.  Had an EGD with gastritis but no other significant finding.  He feels like his temperatures been up slightly at home.  Denies diarrhea.  Feels like his pain is just like it was before but is not improving with his home medications.  The history is provided by the patient.  Abdominal Pain Pain location:  Epigastric and LUQ Pain quality: burning   Pain radiates to:  Does not radiate Pain severity:  Severe Onset quality:  Gradual Duration:  4 days Timing:  Constant Progression:  Worsening Chronicity:  New Context: alcohol use (alcoholic no etoh last 4 days)   Relieved by:  Nothing Worsened by:  Nothing Ineffective treatments:  None tried Associated symptoms: nausea and vomiting   Associated symptoms: no chest pain, no chills, no diarrhea, no fever and no shortness of breath       Past Medical History:  Diagnosis Date   Alcoholism /alcohol abuse    Anemia    Anxiety    Arthritis    "knees; arms; elbows" (03/26/2015)   Asthma    Bipolar disorder (HCC)    Chronic bronchitis (HCC)    Chronic lower back pain    Chronic pancreatitis ([place])    Cocaine abuse ([place])    Depression    Family history of  adverse reaction to anesthesia    "grandmother gets confused"   Femoral condyle fracture ([place]) 03/08/2014   left medial/notes 03/09/2014   GERD (gastroesophageal reflux disease)    H/O hiatal hernia    H/O suicide attempt 10/2012   High cholesterol    History of blood transfusion 10/2012   "when I tried to commit suicide"   History of stomach ulcers    Hypertension    Marijuana abuse, continuous    Migraine    "a few times/year" (03/26/2015)   Pneumonia 1990's X 3   PTSD (post-traumatic stress disorder)    Sickle cell trait ([place])    WPW (Wolff-Parkinson-White syndrome)    [name] 03/06/2013    Patient Active Problem List   Diagnosis Date Noted   Pancreatitis 10/30/2020   Rectal ulcer    Hypomagnesemia    Hypocalcemia 10/23/2020   AKI (acute kidney injury) ([place]) 11/13/2018   Seizure ([place]) 11/13/2018   Gastritis and gastroduodenitis    Nonspecific chest pain 01/08/2018   GI bleed 11/24/2017   Acute blood loss anemia 11/24/2017   Atypical chest pain 11/24/2017   Acute pancreatitis 09/28/2017   Abdominal pain 05/27/2017   Hematemesis 05/27/2017   Tachycardia 03/18/2017   Diarrhea 03/18/2017   Acute on chronic pancreatitis ([place]) 12/17/2016   Chronic alcoholic pancreatitis ([place]) 12/05/2016  Intractable nausea and vomiting 12/05/2016   Verbally abusive behavior 12/05/2016   Normocytic anemia 12/05/2016   Alcohol use disorder, severe, dependence ([place]) 07/25/2016   Cocaine use disorder, severe, dependence ([place]) 07/25/2016   Major depressive disorder, 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   Leukocytosis    Hospital acquired PNA 05/20/2015   Chronic pancreatitis ([place]) 05/18/2015   Pseudocyst of pancreas 05/18/2015   Polysubstance abuse (tobacco, cocaine, THC, and ETOH) 03/26/2015   Alcohol-induced chronic pancreatitis ([place])    Essential hypertension 02/06/2014   Mood disorder ([place]) 02/06/2014   Alcohol-induced acute pancreatitis 11/28/2013    Pancreatic pseudocyst/cyst 11/25/2013   Severe protein-calorie malnutrition ([place]) 10/10/2013   Suicide attempt ([place]) 10/08/2013   [name] Parkinson White pattern seen on electrocardiogram 10/03/2012   TOBACCO ABUSE 03/23/2007    Past Surgical History:  Procedure Laterality Date   BIOPSY  11/25/2017   Procedure: BIOPSY;  Surgeon: Arta Silence, MD;  Location: [facility];  Service: Endoscopy;;   BIOPSY  10/14/2018   Procedure: BIOPSY;  Surgeon: Arta Silence, MD;  Location: [facility];  Service: Endoscopy;;   CARDIAC CATHETERIZATION     CYST ENTEROSTOMY  01/02/2020   Procedure: CYST ASPIRATION;  Surgeon: Milus Banister, MD;  Location: [facility] ENDOSCOPY;  Service: Endoscopy;;   ESOPHAGOGASTRODUODENOSCOPY (EGD) WITH PROPOFOL N/A 11/25/2017   Procedure: ESOPHAGOGASTRODUODENOSCOPY (EGD) WITH PROPOFOL;  Surgeon: Arta Silence, MD;  Location: [facility] ENDOSCOPY;  Service: Endoscopy;  Laterality: N/A;   ESOPHAGOGASTRODUODENOSCOPY (EGD) WITH PROPOFOL Left 10/14/2018   Procedure: ESOPHAGOGASTRODUODENOSCOPY (EGD) WITH PROPOFOL;  Surgeon: Arta Silence, MD;  Location: [facility] ENDOSCOPY;  Service: Endoscopy;  Laterality: Left;   ESOPHAGOGASTRODUODENOSCOPY (EGD) WITH PROPOFOL N/A 11/14/2018   Procedure: ESOPHAGOGASTRODUODENOSCOPY (EGD) WITH PROPOFOL;  Surgeon: Laurence Spates, MD;  Location: [facility] ENDOSCOPY;  Service: Gastroenterology;  Laterality: N/A;   ESOPHAGOGASTRODUODENOSCOPY (EGD) WITH PROPOFOL N/A 01/02/2020   Procedure: ESOPHAGOGASTRODUODENOSCOPY (EGD) WITH PROPOFOL;  Surgeon: Milus Banister, MD;  Location: [facility] ENDOS[facility];  Service: Endoscopy;  Laterality: N/A;   ESOPHAGOGASTRODUODENOSCOPY (EGD) WITH PROPOFOL N/A 10/25/2020   Procedure: ESOPHAGOGASTRODUODENOSCOPY (EGD) WITH PROPOFOL;  Surgeon: Rush Landmark Telford Nab., MD;  Location: [place] [facility];  Service: Gastroenterology;  Laterality: N/A;   EUS N/A 01/02/2020   Procedure: UPPER ENDOSCOPIC ULTRASOUND (EUS) RADIAL;  Surgeon: Milus Banister, MD;  Location: [facility]  ENDOSCOPY;  Service: Endoscopy;  Laterality: N/A;   EYE SURGERY Left 1990's   "result of trauma"    FACIAL FRACTURE SURGERY Left 1990's   "result of trauma"    FLEXIBLE SIGMOIDOSCOPY N/A 10/25/2020   Procedure: FLEXIBLE SIGMOIDOSCOPY;  Surgeon: Irving Copas., MD;  Location: [facility];  Service: Gastroenterology;  Laterality: N/A;   FRACTURE SURGERY     HEMOSTASIS CLIP PLACEMENT  10/25/2020   Procedure: HEMOSTASIS CLIP PLACEMENT;  Surgeon: Irving Copas., MD;  Location: [facility];  Service: Gastroenterology;;   HERNIA REPAIR     LEFT HEART CATHETERIZATION WITH CORONARY ANGIOGRAM Right 03/07/2013   Procedure: LEFT HEART CATHETERIZATION WITH CORONARY ANGIOGRAM;  Surgeon: [name], MD;  Location: [facility] CATH LAB;  Service: Cardiovascular;  Laterality: Right;   UMBILICAL HERNIA REPAIR     UPPER GASTROINTESTINAL ENDOSCOPY         Family History  Problem Relation Age of Onset   Hypertension Mother    Cirrhosis Father    Alcoholism Father    Hypertension Father    Melanoma Father    Hypertension Other    Coronary artery disease Other     Social History   Tobacco Use  Smoking status: Every Day    Packs/day: 1.00    Years: 36.00    Pack years: 36.00    Types: Cigarettes, E-cigarettes   Smokeless tobacco: Never  Vaping Use   Vaping Use: Former  Substance Use Topics   Alcohol use: Yes    Comment: last drink 8 days ago   Drug use: Yes    Types: Marijuana, Cocaine    Comment: daily marijuana use; last cocaine use about 3 months ago    Home Medications Prior to Admission medications   Medication Sig Start Date End Date Taking? Authorizing Provider  acetaminophen (TYLENOL) 500 MG tablet Take 1,000 mg by mouth every 6 (six) hours as needed for moderate pain or headache.    [provider]  albuterol (VENTOLIN HFA) 108 (90 Base) MCG/ACT inhaler Inhale 2 puffs into the lungs every 4 (four) hours as needed for wheezing or shortness of breath. 11/12/19    [name], DO  cholecalciferol (VITAMIN D) 25 MCG tablet Take 1 tablet (1,000 Units total) by mouth daily. 10/30/20   [name], MD  fluticasone furoate-vilanterol (BREO ELLIPTA) 200-25 MCG/INH AEPB Inhale 1 puff into the lungs daily. 11/12/19   [name], DO  folic acid (FOLVITE) 1 MG tablet Take 1 tablet (1 mg total) by mouth daily. 11/26/17   [name], MD  HYDROmorphone (DILAUDID) 4 MG tablet Take 1 tablet (4 mg total) by mouth every 6 (six) hours as needed for severe pain. 11/10/20   Milton Ferguson, MD  lidocaine (XYLOCAINE) 2 % solution Use as directed 15 mLs in the mouth or throat every 6 (six) hours as needed for mouth pain. 05/25/20   [name], MD  lipase/protease/amylase (CREON) 36000 UNITS CPEP capsule Take 2 capsules (72,000 Units total) by mouth 3 (three) times daily with meals. For pancreatitis 11/05/19   [name], [name], MD  metoprolol (TOPROL-XL) 200 MG 24 hr tablet Take 200 mg by mouth daily. 10/29/20   [provider]  Multiple Vitamin (MULTIVITAMIN WITH MINERALS) TABS tablet Take 1 tablet by mouth daily. 09/06/17   Bonnell Public, MD  omeprazole (PRILOSEC) 20 MG capsule Take 20 mg by mouth daily.    [provider]  ondansetron (ZOFRAN-ODT) 4 MG disintegrating tablet Take 4 mg by mouth 2 (two) times daily as needed for vomiting or nausea. 10/20/20   [provider]  sertraline (ZOLOFT) 100 MG tablet Take 1 tablet (100 mg total) by mouth daily. 03/27/20   [name], MD  sucralfate (CARAFATE) 1 g tablet Take 1 tablet (1 g total) by mouth 4 (four) times daily -  with meals and at bedtime. 05/23/20   Kerin Perna, NP  thiamine 100 MG tablet Take 1 tablet (100 mg total) by mouth daily. 10/16/18   [name], MD  amitriptyline (ELAVIL) 25 MG tablet Take 1 tablet (25 mg total) by mouth at bedtime. Patient not taking: Reported on 08/08/2019 10/15/18 08/08/19  [name], MD  gabapentin (NEURONTIN) 100 MG capsule Take 1  capsule (100 mg total) by mouth 2 (two) times daily. Patient not taking: Reported on 08/08/2019 10/15/18 08/08/19  [name], MD  promethazine (PHENERGAN) 25 MG tablet Take 1 tablet (25 mg total) by mouth every 6 (six) hours as needed for nausea or vomiting. Patient not taking: Reported on 08/08/2019 03/02/19 08/08/19  Kinnie Feil, PA-C    Allergies    Robaxin [methocarbamol], Aspirin, Shellfish-derived products, Trazodone and nefazodone, Adhesive [tape], Latex, Toradol [  ketorolac tromethamine], Contrast media [iodinated diagnostic agents], and Reglan [metoclopramide]  Review of Systems   Review of Systems  Constitutional:  Negative for chills and fever.  HENT:  Negative for congestion and facial swelling.   Eyes:  Negative for discharge and visual disturbance.  Respiratory:  Negative for shortness of breath.   Cardiovascular:  Negative for chest pain and palpitations.  Gastrointestinal:  Positive for abdominal pain, nausea and vomiting. Negative for diarrhea.  Musculoskeletal:  Negative for arthralgias and myalgias.  Skin:  Negative for color change and rash.  Neurological:  Negative for tremors, syncope and headaches.  Psychiatric/Behavioral:  Negative for confusion and dysphoric mood.    Physical Exam Updated Vital Signs BP 102/80 (BP Location: Right Arm)   Pulse (!) 106   Temp 98.2 F (36.8 C) (Oral)   Resp 17   Ht 5\' 9"  (1.753 m)   Wt 68 kg   SpO2 95%   BMI 22.15 kg/m   Physical Exam Vitals and nursing note reviewed.  Constitutional:      Appearance: He is well-developed.  HENT:     Head: Normocephalic and atraumatic.  Eyes:     Pupils: Pupils are equal, round, and reactive to light.  Neck:     Vascular: No JVD.  Cardiovascular:     Rate and Rhythm: Normal rate and regular rhythm.     Heart sounds: No murmur heard.   No friction rub. No gallop.  Pulmonary:     Effort: No respiratory distress.     Breath sounds: No wheezing.  Abdominal:     General: There  is no distension.     Tenderness: There is no abdominal tenderness. There is no guarding or rebound.     Comments: No obvious abdominal pain on exam  Musculoskeletal:        General: Normal range of motion.     Cervical back: Normal range of motion and neck supple.  Skin:    Coloration: Skin is not pale.     Findings: No rash.  Neurological:     Mental Status: He is alert and oriented to person, place, and time.  Psychiatric:        Behavior: Behavior normal.    ED Results / Procedures / Treatments   Labs (all labs ordered are listed, but only abnormal results are displayed) Labs Reviewed  COMPREHENSIVE METABOLIC PANEL - Abnormal; Notable for the following components:      Result Value   Sodium 131 (*)    Potassium 3.2 (*)    Chloride 97 (*)    Glucose, Bld 157 (*)    BUN 5 (*)    Creatinine, Ser 0.55 (*)    Calcium 8.7 (*)    Albumin 2.7 (*)    All other components within normal limits  CBC - Abnormal; Notable for the following components:   WBC 14.8 (*)    RBC 3.29 (*)    Hemoglobin 10.4 (*)    HCT 31.3 (*)    All other components within normal limits  URINALYSIS, ROUTINE W REFLEX MICROSCOPIC - Abnormal; Notable for the following components:   Ketones, ur 20 (*)    All other components within normal limits  LIPASE, BLOOD    EKG None  Radiology No results found.  Procedures Procedures   Medications Ordered in ED Medications  famotidine (PEPCID) IVPB 20 mg premix (20 mg Intravenous New Bag/Given 11/14/20 0803)  HYDROmorphone (DILAUDID) injection 1 mg (1 mg Intravenous Given 11/14/20 0758)  droperidol (  INAPSINE) 2.5 MG/ML injection 1.25 mg (1.25 mg Intravenous Given 11/14/20 0800)  diphenhydrAMINE (BENADRYL) injection 25 mg (25 mg Intravenous Given 11/14/20 0802)  sodium chloride 0.9 % bolus 1,000 mL (1,000 mLs Intravenous Bolus 11/14/20 0758)    ED Course  I have reviewed the triage vital signs and the nursing notes.  Pertinent labs & imaging results that were  available during my care of the patient were reviewed by me and considered in my medical decision making (see chart for details).    MDM Rules/Calculators/A&P                          50 yo M with a chief complaints of abdominal pain.  Patient has a history of chronic abdominal pain and feels like this is similar however is not improving with his home medications and he has not been able to eat and drink for about 4 days now.  Was seen in the ED about 4 days ago and had a CT scan that showed acute on chronic pancreatitis.  At that time he had had some improvement of his pain and was able to be discharged home.  Blood work without significant change from baseline.  His lipase is not elevated though has not been elevated recently with signs of pancreatitis on CT.  Will treat pain and nausea and reassess.    Patient with no significant improvements in pain.  He was able to take Maalox but feels somewhat nauseated.  We will give another dose of pain medicine here.  Will discuss with medicine for admission.  The patients results and plan were reviewed and discussed.   Any x-rays performed were independently reviewed by myself.   Differential diagnosis were considered with the presenting HPI.  Medications  HYDROmorphone (DILAUDID) injection 1 mg (1 mg Intravenous Given 11/14/20 0758)  droperidol (INAPSINE) 2.5 MG/ML injection 1.25 mg (1.25 mg Intravenous Given 11/14/20 0800)  diphenhydrAMINE (BENADRYL) injection 25 mg (25 mg Intravenous Given 11/14/20 0802)  sodium chloride 0.9 % bolus 1,000 mL (1,000 mLs Intravenous Bolus 11/14/20 0758)  famotidine (PEPCID) IVPB 20 mg premix (0 mg Intravenous Stopped 11/14/20 0921)  alum & mag hydroxide-simeth (MAALOX/MYLANTA) 200-200-20 MG/5ML suspension 30 mL (30 mLs Oral Given 11/14/20 0927)    Vitals:   11/14/20 0805 11/14/20 0830 11/14/20 0900 11/14/20 0930  BP: (!) 127/97 124/76 (!) 132/95 (!) 138/97  Pulse: (!) 104 94 93 99  Resp: 15 20 20  (!) 22  Temp:       TempSrc:      SpO2: 100% 100% 98% 97%  Weight:      Height:        Final diagnoses:  None    Admission/ observation were discussed with the admitting physician, patient and/or family and they are comfortable with the plan.   Final Clinical Impression(s) / ED Diagnoses Final diagnoses:  None    Rx / DC Orders ED Discharge Orders     None        Deno Etienne, DO 11/14/20 1314

## 2020-11-14 NOTE — H&P (Addendum)
Date: 11/14/2020               Patient Name:  [name] MRN: [id number]  DOB: [birth year] Age / Sex: 50 y.o., male   PCP: Simona Huh, NP              Medical Service: Internal Medicine Teaching Service              Attending Physician: Dr. Evette Doffing, Mallie Mussel, *    First Contact: Baldwin [place], MS3 Pager: [phone]  Second Contact: Dr. Tamsen Snider Pager: [phone]            After Hours (After 5p/  First Contact Pager: [phone]  weekends / holidays): Second Contact Pager: [phone]   Chief Complaint: Abdominal pain  History of Present Illness: [name] is a 50 y/o male with chronic pancreatitis, gastritis, chronic bronchitis, and alcohol abuse who presents with 4-5 days of abdominal pain, worsening acutely over the last 1-2 days.   Mr Ates endorses central and lower abdominal pain that is sharp and constant. He has had acute nausea over the last several days with 1-2 episodes of vomiting each day. He wasn't able to eat anything in the last 24-36 hours and hasn't vomited today. He hasn't stooled in the last 2-3 days but notes his last bowel movement was loose and dark, unlike his usual firm stools.  Mr Durnil also endorses new urinary urgency and slow flow when urinating in the last few weeks but denies any other urinary symptoms.    Meds: Current Facility-Administered Medications  Medication Dose Route Frequency Provider Last Rate Last Admin   acetaminophen (TYLENOL) tablet 1,000 mg  1,000 mg Oral Q6H PRN [name], MD       Or   acetaminophen (TYLENOL) suppository 650 mg  650 mg Rectal Q6H PRN [name], MD       albuterol (PROVENTIL) (2.5 MG/3ML) 0.083% nebulizer solution 2.5 mg  2.5 mg Inhalation Q4H PRN [name], MD       enoxaparin (LOVENOX) injection 40 mg  40 mg Subcutaneous Q24H [name], MD       fluticasone furoate-vilanterol (BREO ELLIPTA) 200-25 MCG/INH 1 puff  1 puff Inhalation Daily [name], MD       lactated ringers  infusion   Intravenous Continuous [name], MD       lidocaine (XYLOCAINE) 2 % viscous mouth solution 15 mL  15 mL Mouth/Throat Q6H PRN [name], MD       lipase/protease/amylase (CREON) capsule 72,000 Units  72,000 Units Oral TID WC [name], MD       metoprolol succinate (TOPROL-XL) 24 hr tablet 200 mg  200 mg Oral Daily [name], MD       multivitamin with minerals tablet 1 tablet  1 tablet Oral Daily [name], MD       ondansetron (ZOFRAN-ODT) disintegrating tablet 4 mg  4 mg Oral BID PRN [name], MD       oxyCODONE (Oxy IR/ROXICODONE) immediate release tablet 5 mg  5 mg Oral Q4H PRN [name], MD       pantoprazole (PROTONIX) EC tablet 40 mg  40 mg Oral Daily [name], MD       sertraline (ZOLOFT) tablet 100 mg  100 mg Oral Daily [name], MD       sucralfate (CARAFATE) tablet 1 g  1 g Oral TID WC & HS [name], MD       Current Outpatient Medications  Medication Sig Dispense Refill   acetaminophen (TYLENOL) 500 MG tablet Take 1,000 mg by mouth every 6 (six) hours as needed for moderate pain or headache.     albuterol (VENTOLIN HFA) 108 (90 Base) MCG/ACT inhaler Inhale 2 puffs into the lungs every 4 (four) hours as needed for wheezing or shortness of breath. 18 g 11   cholecalciferol (VITAMIN D) 25 MCG tablet Take 1 tablet (1,000 Units total) by mouth daily. 30 tablet 0   fluticasone furoate-vilanterol (BREO ELLIPTA) 200-25 MCG/INH AEPB Inhale 1 puff into the lungs daily. 1 each 11   folic acid (FOLVITE) 1 MG tablet Take 1 tablet (1 mg total) by mouth daily. 30 tablet 0   HYDROmorphone (DILAUDID) 4 MG tablet Take 1 tablet (4 mg total) by mouth every 6 (six) hours as needed for severe pain. 20 tablet 0   lidocaine (XYLOCAINE) 2 % solution Use as directed 15 mLs in the mouth or throat every 6 (six) hours as needed for mouth pain. 100 mL 0   lipase/protease/amylase (CREON) 36000 UNITS CPEP capsule Take 2 capsules (72,000 Units total) by mouth 3 (three)  times daily with meals. For pancreatitis 180 capsule 5   metoprolol (TOPROL-XL) 200 MG 24 hr tablet Take 200 mg by mouth daily.     Multiple Vitamin (MULTIVITAMIN WITH MINERALS) TABS tablet Take 1 tablet by mouth daily. 30 tablet 0   omeprazole (PRILOSEC) 20 MG capsule Take 20 mg by mouth daily.     ondansetron (ZOFRAN-ODT) 4 MG disintegrating tablet Take 4 mg by mouth 2 (two) times daily as needed for vomiting or nausea.     sertraline (ZOLOFT) 100 MG tablet Take 1 tablet (100 mg total) by mouth daily. 30 tablet 3   sucralfate (CARAFATE) 1 g tablet Take 1 tablet (1 g total) by mouth 4 (four) times daily -  with meals and at bedtime. 120 tablet 3   thiamine 100 MG tablet Take 1 tablet (100 mg total) by mouth daily. 30 tablet 0    Allergies: Allergies as of 11/14/2020 - Review Complete 11/14/2020  Allergen Reaction Noted   Robaxin [methocarbamol] Other (See Comments) 07/20/2016   Aspirin  07/31/2018   Shellfish-derived products Nausea And Vomiting 08/18/2010   Trazodone and nefazodone Other (See Comments) 09/28/2011   Adhesive [tape] Itching 03/17/2018   Latex Itching 03/17/2018   Toradol [ketorolac tromethamine] Other (See Comments) 03/17/2018   Contrast media [iodinated diagnostic agents] Hives 12/04/2016   Reglan [metoclopramide] Other (See Comments) 11/18/2015   Past Medical History:  Diagnosis Date   Alcoholism /alcohol abuse    Anemia    Anxiety    Arthritis    "knees; arms; elbows" (03/26/2015)   Asthma    Bipolar disorder (HCC)    Chronic bronchitis (HCC)    Chronic lower back pain    Chronic pancreatitis ([place])    Cocaine abuse ([place])    Depression    Family history of adverse reaction to anesthesia    "grandmother gets confused"   Femoral condyle fracture ([place]) 03/08/2014   left medial/notes 03/09/2014   GERD (gastroesophageal reflux disease)    H/O hiatal hernia    H/O suicide attempt 10/2012   High cholesterol    History of blood transfusion 10/2012   "when I tried  to commit suicide"   History of stomach ulcers    Hypertension    Marijuana abuse, continuous    Migraine    "a few times/year" (03/26/2015)   Pneumonia 1990's X 3  PTSD (post-traumatic stress disorder)    Sickle cell trait ([place])    WPW (Wolff-Parkinson-White syndrome)    [name] 03/06/2013   Past Surgical History:  Procedure Laterality Date   BIOPSY  11/25/2017   Procedure: BIOPSY;  Surgeon: Arta Silence, MD;  Location: [facility] ENDOSCOPY;  Service: Endoscopy;;   BIOPSY  10/14/2018   Procedure: BIOPSY;  Surgeon: Arta Silence, MD;  Location: [place];  Service: Endoscopy;;   CARDIAC CATHETERIZATION     CYST ENTEROSTOMY  01/02/2020   Procedure: CYST ASPIRATION;  Surgeon: Milus Banister, MD;  Location: [facility] ENDOSCOPY;  Service: Endoscopy;;   ESOPHAGOGASTRODUODENOSCOPY (EGD) WITH PROPOFOL N/A 11/25/2017   Procedure: ESOPHAGOGASTRODUODENOSCOPY (EGD) WITH PROPOFOL;  Surgeon: Arta Silence, MD;  Location: [facility] ENDOSCOPY;  Service: Endoscopy;  Laterality: N/A;   ESOPHAGOGASTRODUODENOSCOPY (EGD) WITH PROPOFOL Left 10/14/2018   Procedure: ESOPHAGOGASTRODUODENOSCOPY (EGD) WITH PROPOFOL;  Surgeon: Arta Silence, MD;  Location: [facility] END[facility]COPY;  Service: Endoscopy;  Laterality: Left;   ESOPHAGOGASTRODUODENOSCOPY (EGD) WITH PROPOFOL N/A 11/14/2018   Procedure: ESOPHAGOGASTRODUODENOSCOPY (EGD) WITH PROPOFOL;  Surgeon: Laurence Spates, MD;  Location: [facility] ENDOSCOPY;  Service: Gastroenterology;  Laterality: N/A;   ESOPHAGOGASTRODUODENOSCOPY (EGD) WITH PROPOFOL N/A 01/02/2020   Procedure: ESOPHAGOGASTRODUODENOSCOPY (EGD) WITH PROPOFOL;  Surgeon: Milus Banister, MD;  Location: [facility] ENDOSCOPY;  Service: Endoscopy;  Laterality: N/A;   ESOPHAGOGASTRODUODENOSCOPY (EGD) WITH PROPOFOL N/A 10/25/2020   Procedure: ESOPHAGOGASTRODUODENOSCOPY (EGD) WITH PROPOFOL;  Surgeon: Rush Landmark Telford Nab., MD;  Location: [place];  Service: Gastroenterology;  Laterality: N/A;   EUS N/A 01/02/2020   Procedure: UPPER ENDOSCOPIC  ULTRASOUND (EUS) RADIAL;  Surgeon: Milus Banister, MD;  Location: [facility] ENDOSCOPY;  Service: Endoscopy;  Laterality: N/A;   EYE SURGERY Left 1990's   "result of trauma"    FACIAL FRACTURE SURGERY Left 1990's   "result of trauma"    FLEXIBLE SIGMOIDOSCOPY N/A 10/25/2020   Procedure: FLEXIBLE SIGMOIDOSCOPY;  Surgeon: Irving Copas., MD;  Location: [place];  Service: Gastroenterology;  Laterality: N/A;   FRACTURE SURGERY     HEMOSTASIS CLIP PLACEMENT  10/25/2020   Procedure: HEMOSTASIS CLIP PLACEMENT;  Surgeon: Irving Copas., MD;  Location: [place];  Service: Gastroenterology;;   HERNIA REPAIR     LEFT HEART CATHETERIZATION WITH CORONARY ANGIOGRAM Right 03/07/2013   Procedure: LEFT HEART CATHETERIZATION WITH CORONARY ANGIOGRAM;  Surgeon: [name], MD;  Location: [place] CATH LAB;  Service: Cardiovascular;  Laterality: Right;   UMBILICAL HERNIA REPAIR     UPPER GASTROINTESTINAL ENDOSCOPY     Family History  Problem Relation Age of Onset   Hypertension Mother    Cirrhosis Father    Alcoholism Father    Hypertension Father    Melanoma Father    Hypertension Other    Coronary artery disease Other    Social History   Socioeconomic History   Marital status: Single    Spouse name: Not on file   Number of children: Not on file   Years of education: Not on file   Highest education level: Not on file  Occupational History   Occupation: disabled  Tobacco Use   Smoking status: Every Day    Packs/day: 1.00    Years: 36.00    Pack years: 36.00    Types: Cigarettes, E-cigarettes   Smokeless tobacco: Never  Vaping Use   Vaping Use: Former  Substance and Sexual Activity   Alcohol use: Yes    Comment: last drink 8 days ago   Drug use: Yes    Types: Marijuana, Cocaine  Comment: daily marijuana use; last cocaine use about 3 months ago   Sexual activity: Yes    Birth control/protection: None  Other Topics Concern   Not on file  Social History Narrative    ** Merged History Encounter **       Social Determinants of Health   Financial Resource Strain: Not on file  Food Insecurity: Not on file  Transportation Needs: Not on file  Physical Activity: Not on file  Stress: Not on file  Social Connections: Not on file  Intimate Partner Violence: Not on file    Review of Systems: Pertinent items noted in HPI and remainder of comprehensive ROS otherwise negative.  Physical Exam: Blood pressure (!) 138/94, pulse (!) 108, temperature 98.2 F (36.8 C), temperature source Oral, resp. rate 14, height 5\' 9"  (1.753 m), weight 68 kg, SpO2 100 %.  General: Pleasant, tired-appearing middle-age man laying in bed. No acute distress. Head: Atraumatic. CV: RRR. No murmurs, rubs, or gallops. No LE edema Pulmonary: Lungs CTAB. Normal effort. No wheezing or rales. Abdominal: Some guardnig, tenderness at periumbilical and lower abdomen, nondistended. Normal bowel sounds. Extremities: Palpable pulses. Normal ROM. Skin: Warm and dry. No obvious rash or lesions. Neuro: A&Ox3. Moves all extremities. Normal sensation. No focal deficit. Psych: Normal mood and affect   Imaging results:  No results found.  Assessment & Plan by Problem: Active Problems:   Acute pancreatitis  Acute on chronic pancreatitis Pt with acute nausea, vomiting and abdominal pain consistent with previous episodes of chronic pancreatitis. Lipase at 20 non-specific in setting of chronic pancreatitis. Recent (four days ago) 11/10/20 CT A/P w/o contrast revealed "acute on chronic pancreatitis with grossly similar appearance of known regions of duct dilatation and pseudocysts."  -NPO bowel rest -72k units Creon TID -Protonix 40mg  daily -Zofran 4mg  BID prn for nausea -Sucralfate 1g TID and Lidocaine viscous mouth wash 10mL q6h prn -1000mg  Tylenol q6h prn and Oxycodone 5mg  q4h prn   Electrolyte abnormalities 11/14/20 K low at 3.2. Low Na at 131 and low chloride at 97 in setting of nausea and  vomiting. Pt received 1L bolus of NaCL 0.9%. -Lactated ringers   Polysubstance use disorders Known history of alcohol, cocaine and marijuana use disorders. Has outpatient prescription for Oxycodone. While pt does have radiographic evidence corroborating symptomatic reports of acute pancreatitis, do hold this in tension with a pattern of multiple ED visits this week (and previously) with questions by the pt documented in each note regarding pain control. Suspicion is raised that while this patient appears to have an acute process that deserves appropriate pain control, there may unfortunately also be a developing dependence on narcotics.  -Will engage this patient in further conversation  -Will connect this patient with social work for resources  Weak urinary stream UA normal except for urine ketones at 20. No dysuria. Possible enlarged prostate or other obstruction. Glucose not overtly high at 157. -Ordering Post Void Residual for further evaluation  Chronic bronchitis Symptomatically stable. -Breo ellipta 1 puff daily. -Albuterol 2.5mg  q4h prn  Anxiety and depression -Resume 100mg  Sertraline  Hypertension -Continue Toprol-xl 200mg  daily  Prophylactic anticoagulation during admission -Lovenox 40mg  q24h   Signed: London Pepper, Medical Student 11/14/2020, 11:30 AM   Attestation for Student Documentation:  I personally was present and performed or re-performed the history, physical exam and medical decision-making activities of this service and have verified that the service and findings are accurately documented in the student's note.  [name], MD 11/14/2020, 6:54 PM

## 2020-11-14 NOTE — ED Notes (Signed)
Report attempted to inpatient unit x 2. Callback number provided.

## 2020-11-14 NOTE — ED Triage Notes (Signed)
Presents to  ed via [facility] c/o abd. Pain onset early this am with n/v.

## 2020-11-14 NOTE — Progress Notes (Signed)
Pt asking for pain medicine. Pt states 'I'm hurting really bad.'. Pt received oxycodone 5 mg p.o at 2021. Pt seen ambulating in the room and in the hallway. [facility] on call notified. Per MD, they are very familiar with the pt and will come by and evaluate the pt.

## 2020-11-14 NOTE — ED Notes (Signed)
This tech saw this pt leave the ED without notifying staff where he was going. This tech stopped him and ask where he was going and if he was leaving AMA. Pt said he was going out to the lobby and planned on coming back. Pt was informed that if he left the ED, that his IV must come out because it is hospital property. Pt was redirected back to his room.

## 2020-11-15 DIAGNOSIS — E871 Hypo-osmolality and hyponatremia: Secondary | ICD-10-CM | POA: Diagnosis present

## 2020-11-15 DIAGNOSIS — E876 Hypokalemia: Secondary | ICD-10-CM | POA: Diagnosis present

## 2020-11-15 DIAGNOSIS — R824 Acetonuria: Secondary | ICD-10-CM | POA: Diagnosis present

## 2020-11-15 DIAGNOSIS — Z888 Allergy status to other drugs, medicaments and biological substances status: Secondary | ICD-10-CM | POA: Diagnosis not present

## 2020-11-15 DIAGNOSIS — Z20822 Contact with and (suspected) exposure to covid-19: Secondary | ICD-10-CM | POA: Diagnosis present

## 2020-11-15 DIAGNOSIS — K863 Pseudocyst of pancreas: Secondary | ICD-10-CM | POA: Diagnosis present

## 2020-11-15 DIAGNOSIS — F332 Major depressive disorder, recurrent severe without psychotic features: Secondary | ICD-10-CM | POA: Diagnosis present

## 2020-11-15 DIAGNOSIS — R300 Dysuria: Secondary | ICD-10-CM | POA: Diagnosis present

## 2020-11-15 DIAGNOSIS — F1010 Alcohol abuse, uncomplicated: Secondary | ICD-10-CM | POA: Diagnosis present

## 2020-11-15 DIAGNOSIS — K861 Other chronic pancreatitis: Secondary | ICD-10-CM | POA: Diagnosis not present

## 2020-11-15 DIAGNOSIS — K859 Acute pancreatitis without necrosis or infection, unspecified: Secondary | ICD-10-CM | POA: Diagnosis present

## 2020-11-15 DIAGNOSIS — I10 Essential (primary) hypertension: Secondary | ICD-10-CM | POA: Diagnosis present

## 2020-11-15 DIAGNOSIS — E43 Unspecified severe protein-calorie malnutrition: Secondary | ICD-10-CM | POA: Diagnosis present

## 2020-11-15 DIAGNOSIS — D638 Anemia in other chronic diseases classified elsewhere: Secondary | ICD-10-CM | POA: Diagnosis present

## 2020-11-15 DIAGNOSIS — D573 Sickle-cell trait: Secondary | ICD-10-CM | POA: Diagnosis present

## 2020-11-15 DIAGNOSIS — K852 Alcohol induced acute pancreatitis without necrosis or infection: Secondary | ICD-10-CM | POA: Diagnosis present

## 2020-11-15 DIAGNOSIS — E861 Hypovolemia: Secondary | ICD-10-CM | POA: Diagnosis present

## 2020-11-15 DIAGNOSIS — J449 Chronic obstructive pulmonary disease, unspecified: Secondary | ICD-10-CM | POA: Diagnosis present

## 2020-11-15 DIAGNOSIS — Z8711 Personal history of peptic ulcer disease: Secondary | ICD-10-CM | POA: Diagnosis not present

## 2020-11-15 DIAGNOSIS — R339 Retention of urine, unspecified: Secondary | ICD-10-CM | POA: Diagnosis not present

## 2020-11-15 DIAGNOSIS — Z6822 Body mass index (BMI) 22.0-22.9, adult: Secondary | ICD-10-CM | POA: Diagnosis not present

## 2020-11-15 DIAGNOSIS — Z8249 Family history of ischemic heart disease and other diseases of the circulatory system: Secondary | ICD-10-CM | POA: Diagnosis not present

## 2020-11-15 DIAGNOSIS — R3915 Urgency of urination: Secondary | ICD-10-CM | POA: Diagnosis present

## 2020-11-15 DIAGNOSIS — K219 Gastro-esophageal reflux disease without esophagitis: Secondary | ICD-10-CM | POA: Diagnosis present

## 2020-11-15 DIAGNOSIS — R338 Other retention of urine: Secondary | ICD-10-CM | POA: Diagnosis present

## 2020-11-15 DIAGNOSIS — K860 Alcohol-induced chronic pancreatitis: Secondary | ICD-10-CM | POA: Diagnosis present

## 2020-11-15 DIAGNOSIS — Z91041 Radiographic dye allergy status: Secondary | ICD-10-CM | POA: Diagnosis not present

## 2020-11-15 LAB — CBC WITH DIFFERENTIAL/PLATELET
Abs Immature Granulocytes: 0.09 10*3/uL — ABNORMAL HIGH (ref 0.00–0.07)
Basophils Absolute: 0 10*3/uL (ref 0.0–0.1)
Basophils Relative: 0 %
Eosinophils Absolute: 0 10*3/uL (ref 0.0–0.5)
Eosinophils Relative: 0 %
HCT: 28 % — ABNORMAL LOW (ref 39.0–52.0)
Hemoglobin: 9.2 g/dL — ABNORMAL LOW (ref 13.0–17.0)
Immature Granulocytes: 1 %
Lymphocytes Relative: 8 %
Lymphs Abs: 1.2 10*3/uL (ref 0.7–4.0)
MCH: 30.9 pg (ref 26.0–34.0)
MCHC: 32.9 g/dL (ref 30.0–36.0)
MCV: 94 fL (ref 80.0–100.0)
Monocytes Absolute: 2.5 10*3/uL — ABNORMAL HIGH (ref 0.1–1.0)
Monocytes Relative: 16 %
Neutro Abs: 11.5 10*3/uL — ABNORMAL HIGH (ref 1.7–7.7)
Neutrophils Relative %: 75 %
Platelets: 353 10*3/uL (ref 150–400)
RBC: 2.98 MIL/uL — ABNORMAL LOW (ref 4.22–5.81)
RDW: 15.2 % (ref 11.5–15.5)
WBC: 15.3 10*3/uL — ABNORMAL HIGH (ref 4.0–10.5)
nRBC: 0 % (ref 0.0–0.2)

## 2020-11-15 LAB — COMPREHENSIVE METABOLIC PANEL
ALT: 13 U/L (ref 0–44)
AST: 19 U/L (ref 15–41)
Albumin: 2.5 g/dL — ABNORMAL LOW (ref 3.5–5.0)
Alkaline Phosphatase: 87 U/L (ref 38–126)
Anion gap: 9 (ref 5–15)
BUN: <5 mg/dL — ABNORMAL LOW (ref 6–20)
CO2: 26 mmol/L (ref 22–32)
Calcium: 8.7 mg/dL — ABNORMAL LOW (ref 8.9–10.3)
Chloride: 96 mmol/L — ABNORMAL LOW (ref 98–111)
Creatinine, Ser: 0.51 mg/dL — ABNORMAL LOW (ref 0.61–1.24)
GFR, Estimated: >60 mL/min (ref >60)
Glucose, Bld: 128 mg/dL — ABNORMAL HIGH (ref 70–99)
Potassium: 3.8 mmol/L (ref 3.5–5.1)
Sodium: 131 mmol/L — ABNORMAL LOW (ref 135–145)
Total Bilirubin: 1 mg/dL (ref 0.3–1.2)
Total Protein: 6.2 g/dL — ABNORMAL LOW (ref 6.5–8.1)

## 2020-11-15 MED ORDER — GABAPENTIN 600 MG PO TABS
300.0000 mg | ORAL_TABLET | Freq: Three times a day (TID) | ORAL | Status: DC
  Administered 2020-11-15 (×2): 300 mg via ORAL
  Filled 2020-11-15 (×4): qty 0.5

## 2020-11-15 MED ORDER — ALUM & MAG HYDROXIDE-SIMETH 200-200-20 MG/5ML PO SUSP
30.0000 mL | Freq: Four times a day (QID) | ORAL | Status: DC | PRN
  Administered 2020-11-15 – 2020-11-18 (×5): 30 mL via ORAL
  Filled 2020-11-15 (×5): qty 30

## 2020-11-15 MED ORDER — HYDROMORPHONE HCL 1 MG/ML IJ SOLN
0.5000 mg | Freq: Four times a day (QID) | INTRAMUSCULAR | Status: DC | PRN
  Administered 2020-11-15 – 2020-11-19 (×17): 0.5 mg via INTRAVENOUS
  Filled 2020-11-15 (×18): qty 0.5

## 2020-11-15 MED ORDER — OXYCODONE HCL 5 MG PO TABS
10.0000 mg | ORAL_TABLET | ORAL | Status: DC | PRN
Start: 2020-11-15 — End: 2020-11-20
  Administered 2020-11-15 – 2020-11-20 (×30): 10 mg via ORAL
  Filled 2020-11-15 (×30): qty 2

## 2020-11-15 MED ORDER — LACTATED RINGERS IV SOLN: INTRAVENOUS | Status: DC

## 2020-11-15 MED ORDER — OXYCODONE HCL 5 MG PO TABS
5.0000 mg | ORAL_TABLET | ORAL | Status: DC | PRN
Start: 2020-11-15 — End: 2020-11-15
  Administered 2020-11-15 (×3): 5 mg via ORAL
  Filled 2020-11-15 (×3): qty 1

## 2020-11-15 MED ORDER — DULOXETINE HCL 30 MG PO CPEP
30.0000 mg | ORAL_CAPSULE | Freq: Every day | ORAL | Status: DC
  Administered 2020-11-15 – 2020-11-20 (×6): 30 mg via ORAL
  Filled 2020-11-15 (×7): qty 1

## 2020-11-15 NOTE — Progress Notes (Signed)
    Subjective:  Overnight Events: Interval of oxycodone decreased overnight for pain.  Patient continues to have abdominal pain, sharp pain which is rate 8/10.   Currently pain is improved to a 4/10 with oxycodone and IV Dilaudid, but only last about 3 hours. We discussed multimodal pain regimen and adjusting opioid pain regimen. No other concerns on exam.   Objective:  Vital signs in last 24 hours:  Vitals:   11/14/20 1315 11/14/20 1600 11/14/20 2025 11/15/20 0443  BP: (!) 149/97 (!) 134/96 (!) 145/90 (!) 150/95  Pulse: (!) 124 79 86 86  Resp: 16  17 16   Temp:   98.2 F (36.8 C) 98.9 F (37.2 C)  TempSrc:   Oral Oral  SpO2: 100%  100% 100%  Weight:      Height:       Supplemental O2: Room Air SpO2: 100 %   Physical Exam:   General: Middle-age ,resting in bed in no acute distress Cardiovascular: Normal rate, regular rhythm.  No murmurs, rubs, or gallops Pulmonary : Effort normal, breath sounds normal. No wheezes, rales, or rhonchi Abdominal: Bowel sounds present soft, tender in left lower quadrant and epigastric region with deep palpation Skin: warm, dry    Assessment/Plan:   Active Problems:   Acute pancreatitis   Patient Summary: Hospital day 1 for Cregg Jordani Nunn a 50 y.o. person living with alcohol use disorder and gastritis admitted for acute on chronic pancreatitis.   #Acute on chronic pancreatitis #Alcohol Use disorder Patient continues to have abdominal pain and not yet tolerating normal p.o. intake.  Will adjust pain regimen today as below.  Briefly discussed treating alcohol use disorder, will readdress this on daily rounds. -Work-up for nutritional deficiencies at last visit given chronic pancreatitis, low vitamin E. This test is likely effected by his low albumin state.  Ultimately  patient has several negative social terms of health and will focus on pain regimen and offer treatment of alcohol use disorder. When taking better PO intake we can  supplement his nutrition and recommend this be followed in outpatient. -LR 125/h x 24 hours -Start gabapentin 300 3 times daily and duloxetine 30 mg daily -Increase to oxycodone 10 mg every 4 hours for moderate pain, Dilaudid 1 mg every 6 hours for severe pain  #Anemia Normocytic anemia, Iron 57, Ferritin on 6/18. Hgb stable, will check iron.  - Iron studies - CBC   #Hyponatremia -Mild, asymptomatic. likely hypovolemic hyponatremia -Continue to monitor on BMP    Dispo: Anticipated discharge to  home  in 1-2 days pending improvement of acute pancreatitis   Tamsen Snider, MD PGY3 Internal Medicine Pager: [phone] Please contact the on call pager after 5 pm and on weekends at [phone].

## 2020-11-16 LAB — CBC WITH DIFFERENTIAL/PLATELET
Abs Immature Granulocytes: 0.1 10*3/uL — ABNORMAL HIGH (ref 0.00–0.07)
Basophils Absolute: 0 10*3/uL (ref 0.0–0.1)
Basophils Relative: 0 %
Eosinophils Absolute: 0.1 10*3/uL (ref 0.0–0.5)
Eosinophils Relative: 0 %
HCT: 26.5 % — ABNORMAL LOW (ref 39.0–52.0)
Hemoglobin: 8.8 g/dL — ABNORMAL LOW (ref 13.0–17.0)
Immature Granulocytes: 1 %
Lymphocytes Relative: 8 %
Lymphs Abs: 1.4 10*3/uL (ref 0.7–4.0)
MCH: 30.9 pg (ref 26.0–34.0)
MCHC: 33.2 g/dL (ref 30.0–36.0)
MCV: 93 fL (ref 80.0–100.0)
Monocytes Absolute: 2.8 10*3/uL — ABNORMAL HIGH (ref 0.1–1.0)
Monocytes Relative: 17 %
Neutro Abs: 12.4 10*3/uL — ABNORMAL HIGH (ref 1.7–7.7)
Neutrophils Relative %: 74 %
Platelets: 343 10*3/uL (ref 150–400)
RBC: 2.85 MIL/uL — ABNORMAL LOW (ref 4.22–5.81)
RDW: 15.1 % (ref 11.5–15.5)
WBC: 16.8 10*3/uL — ABNORMAL HIGH (ref 4.0–10.5)
nRBC: 0 % (ref 0.0–0.2)

## 2020-11-16 LAB — IRON AND TIBC
Iron: 10 ug/dL — ABNORMAL LOW (ref 45–182)
Saturation Ratios: 6 % — ABNORMAL LOW (ref 17.9–39.5)
TIBC: 178 ug/dL — ABNORMAL LOW (ref 250–450)
UIBC: 168 ug/dL

## 2020-11-16 LAB — COMPREHENSIVE METABOLIC PANEL
ALT: 11 U/L (ref 0–44)
AST: 13 U/L — ABNORMAL LOW (ref 15–41)
Albumin: 2.2 g/dL — ABNORMAL LOW (ref 3.5–5.0)
Alkaline Phosphatase: 78 U/L (ref 38–126)
Anion gap: 9 (ref 5–15)
BUN: <5 mg/dL — ABNORMAL LOW (ref 6–20)
CO2: 26 mmol/L (ref 22–32)
Calcium: 8.2 mg/dL — ABNORMAL LOW (ref 8.9–10.3)
Chloride: 93 mmol/L — ABNORMAL LOW (ref 98–111)
Creatinine, Ser: 0.54 mg/dL — ABNORMAL LOW (ref 0.61–1.24)
GFR, Estimated: >60 mL/min (ref >60)
Glucose, Bld: 92 mg/dL (ref 70–99)
Potassium: 3.6 mmol/L (ref 3.5–5.1)
Sodium: 128 mmol/L — ABNORMAL LOW (ref 135–145)
Total Bilirubin: 0.9 mg/dL (ref 0.3–1.2)
Total Protein: 5.7 g/dL — ABNORMAL LOW (ref 6.5–8.1)

## 2020-11-16 LAB — FERRITIN: Ferritin: 222 ng/mL (ref 24–336)

## 2020-11-16 LAB — BASIC METABOLIC PANEL
Anion gap: 12 (ref 5–15)
Anion gap: 9 (ref 5–15)
BUN: <5 mg/dL — ABNORMAL LOW (ref 6–20)
BUN: <5 mg/dL — ABNORMAL LOW (ref 6–20)
CO2: 26 mmol/L (ref 22–32)
CO2: 28 mmol/L (ref 22–32)
Calcium: 8.3 mg/dL — ABNORMAL LOW (ref 8.9–10.3)
Calcium: 8.3 mg/dL — ABNORMAL LOW (ref 8.9–10.3)
Chloride: 91 mmol/L — ABNORMAL LOW (ref 98–111)
Chloride: 91 mmol/L — ABNORMAL LOW (ref 98–111)
Creatinine, Ser: 0.55 mg/dL — ABNORMAL LOW (ref 0.61–1.24)
Creatinine, Ser: 0.52 mg/dL — ABNORMAL LOW (ref 0.61–1.24)
GFR, Estimated: >60 mL/min (ref >60)
GFR, Estimated: >60 mL/min (ref >60)
Glucose, Bld: 76 mg/dL (ref 70–99)
Glucose, Bld: 130 mg/dL — ABNORMAL HIGH (ref 70–99)
Potassium: 3.6 mmol/L (ref 3.5–5.1)
Potassium: 3.6 mmol/L (ref 3.5–5.1)
Sodium: 129 mmol/L — ABNORMAL LOW (ref 135–145)
Sodium: 128 mmol/L — ABNORMAL LOW (ref 135–145)

## 2020-11-16 LAB — SODIUM, URINE, RANDOM: Sodium, Ur: 166 mmol/L

## 2020-11-16 LAB — OSMOLALITY, URINE: Osmolality, Ur: 446 mOsm/kg (ref 300–900)

## 2020-11-16 MED ORDER — PROSOURCE PLUS PO LIQD
30.0000 mL | Freq: Two times a day (BID) | ORAL | Status: DC
  Administered 2020-11-16 – 2020-11-21 (×6): 30 mL via ORAL
  Filled 2020-11-16 (×8): qty 30

## 2020-11-16 MED ORDER — BOOST / RESOURCE BREEZE PO LIQD CUSTOM
1.0000 | Freq: Three times a day (TID) | ORAL | Status: DC
  Administered 2020-11-16 – 2020-11-19 (×6): 1 via ORAL

## 2020-11-16 MED ORDER — ENSURE ENLIVE PO LIQD
237.0000 mL | Freq: Two times a day (BID) | ORAL | Status: DC
  Administered 2020-11-17 – 2020-11-21 (×6): 237 mL via ORAL

## 2020-11-16 MED ORDER — MELATONIN 3 MG PO TABS
3.0000 mg | ORAL_TABLET | Freq: Every day | ORAL | Status: DC
  Administered 2020-11-16 – 2020-11-20 (×6): 3 mg via ORAL
  Filled 2020-11-16 (×6): qty 1

## 2020-11-16 MED ORDER — SODIUM CHLORIDE 0.9 % IV SOLN: INTRAVENOUS | Status: DC

## 2020-11-16 MED ORDER — GABAPENTIN 300 MG PO CAPS
300.0000 mg | ORAL_CAPSULE | Freq: Three times a day (TID) | ORAL | Status: DC
  Administered 2020-11-16 – 2020-11-20 (×14): 300 mg via ORAL
  Filled 2020-11-16 (×14): qty 1

## 2020-11-16 MED ORDER — SODIUM CHLORIDE 0.9 % IV BOLUS
500.0000 mL | Freq: Once | INTRAVENOUS | Status: AC
  Administered 2020-11-16: 500 mL via INTRAVENOUS

## 2020-11-16 NOTE — Progress Notes (Addendum)
Subjective:  [name] is a 50 y/o male with chronic pancreatitis, gastritis, chronic bronchitis, and polysubstance misuse disorder, admitted for treatment of acute on chronic pancreatitis.  Pt notes that he is still continuing to have abdominal pain. Had nausea, vomiting and abdominal pain last night, which ensued after eating jello. He was able to keep some chicken broth down yesterday. Has not tried any food today. Reports mild headache as well. Thinks he feels his pancreas moving inside his belly when he gets up to walk.  When asked about alcohol cessation, notes that he is "tired of drinking" and ready to get help. He takes oxycodone chronically so not a candidate for naltrexone. Would like to try inpatient rehab and willing to discuss this further with SW and fill out appropriate forms during this admission.   Objective:  Vital signs in last 24 hours: Vitals:   11/15/20 2237 11/16/20 0812 11/16/20 0855 11/16/20 1406  BP: (!) 139/97 132/79  (!) 144/97  Pulse: 95 97  90  Resp: 18 17  18   Temp: 99.3 F (37.4 C) 98.5 F (36.9 C)  98.9 F (37.2 C)  TempSrc: Oral Oral  Oral  SpO2: 98% 95% 94% 100%  Weight:      Height:       Weight change:   Intake/Output Summary (Last 24 hours) at 11/16/2020 1409 Last data filed at 11/16/2020 0900 Gross per 24 hour  Intake 2266 ml  Output --  Net 2266 ml   General: Pleasant, well-appearing middle-age man laying in bed. No acute distress. Head: Normocephalic. Atraumatic. CV: RRR. No murmurs, rubs, or gallops. No LE edema Pulmonary: Lungs CTAB. Normal effort. No wheezing or rales. Abdominal: Soft, tender, nondistended. Normal bowel sounds. Extremities: Palpable pulses. Normal ROM. Skin: Warm and dry. No obvious rash or lesions. Neuro: A&Ox3. Moves all extremities. Normal sensation. No focal deficit. Psych: Normal mood and affect   Assessment/Plan:  Principal Problem:   Acute on chronic pancreatitis (HCC) Active  Problems:   Pancreatic pseudocyst/cyst   Major depressive disorder, recurrent severe without psychotic features ([place])   Acute pancreatitis  Acute on chronic pancreatitis Continued abdominal pain and not yet consistently tolerating PO intake. Continuing pain regimen; nausea overall more controlled. Pt thinks he can eat some clear liquids today.  -Creon TID w/ meals -40mg  Protonix -Sucralfate and Xylocaine -Continue 300mg  Gabapentin TID and 30mg  Duloxetine daily -Continue 10mg  Oxycodone q4hrs for moderate, and 1mg  Dilaudid q6hrs for severe  Polysubstance use disorder (Cocaine, THC and alcohol) Discussed inpatient alcohol rehab placement for this willing patient who finds sobriety difficult to maintain in practice. Pt agreeable. Unfortunately, Naltrexone not an option as pt does take outpatient Oxycodone -Referral placed to SW  Hyponatremia Na at 129, Cl at 91, Serum Osm low at 264, Ur Osm normal at 446, Ur Na normal at 166. Asymptomatic. Pt appears clinically euvolemic. Not tolerating PO intake yet. Don't suspect adrenal insufficiency with normal glucose, don't suspect hypothyroidism with previously normal TSH levels (last in April 2021), and no current diuretics on board. Will try NS challenge. -Ordered NaCl 0.9% bolus, and rechecking BMP 2 hours after.  Anemia Hgb down to 8.8. Normocytic with replete ferritin. Iron at 10 with a 6% Saturation ratio. 10/25/20 Vitamin B normal at 403 and Folate normal at 16.4. Clinical picture is consistent with anemia of chronic disease, likely from his chronic pancreatitis as his other labs aren't consistent with CKD or liver failure. Currently asymptomatic.  -CBC tomorrow to continue monitoring. Will transfuse  if Hgb >7 and with symptoms.     LOS: 1 day   London Pepper, Medical Student 11/16/2020, 2:09 PM

## 2020-11-16 NOTE — Progress Notes (Signed)
Initial Nutrition Assessment  DOCUMENTATION CODES:  Severe malnutrition in context of chronic illness  INTERVENTION:  Consider supplementing Vitamin D and E.  Consider adding 1 mg folic acid daily.  Add Boost Breeze po TID, each supplement provides 250 kcal and 9 grams of protein.  Add 30 ml ProSource Plus po BID, each supplement provides 100 kcal and 15 grams of protein.   Continue MVI with minerals daily.  NUTRITION DIAGNOSIS:  Severe Malnutrition related to chronic illness (pancreatitis, EtOH abuse) as evidenced by severe fat depletion, severe muscle depletion, percent weight loss.  GOAL:  Patient will meet greater than or equal to 90% of their needs  MONITOR:  Diet advancement, PO intake, Supplement acceptance, Labs, Weight trends, I & O's  REASON FOR ASSESSMENT:  Malnutrition Screening Tool    ASSESSMENT:  Patient with PMH significant for ETOH use, bipolar disorder, chronic bronchitis, chronic pancreatitis, GERD, HTN, high cholesterol, and WPW. Pt admitted recently for acute on chronic pancreatitis and gastritis. Presents this admission again with acute on chronic pancreatitis. 7/9 - advanced to clear liquid diet  Pt had 100% of all his clear liquids yesterday.  From previous RD note, "Typically eats two meals daily that consist of L- hamburger, greens, pinto beans and D- chicken, rice, vegetables. Reports drinking beer with lemon PTA (unable to disclose amount). States he understands which foods affect his pancreatitis such as chocolate, coffee, fried chicken, alcohol, and butter. RD briefly reviewed foods that are higher in acidity. Discussed the importance of protein intake for preservation of lean body mass. Patient states he likes Ensure and it does not affect him negatively. Patient reports a UBW of 145 lb and a recent weight loss of 25 lb over the last two years."  Records indicate patient weighed 143 lb on 03/10/20 and 124 lb on 11/10/20 (13.2% wt loss in 7 months,  significant for time frame). [name] weight up to 68 kg this admission. Unsure if it is accurate given that this is a 12 kg increase in 4 days. RD to request standing weight for accuracy.  Pt diagnosed with severe malnutrition during last admission. Pt continues to have severe malnutrition.  Recommend adding Boost Breeze TID and ProSource Plus BID. Continue MVI with minerals.  Given pt's low Vitamin D and E levels, and history of EtOH abuse, spoke with MD and team about supplementing D, E, and folic acid. Team in agreement.  Medications: reviewed; Creon TID with meals, MVI with minerals, Protonix, sucralfate, Dilaudid PRN (given 3 times today), oxycodone PRN (given 3 times today)  Labs: reviewed; Na 129 (L) Pancreatitis Labs: Vitamin A: 27.5 (10/30/20) Vitamin C: 1.2 (10/30/20) Vitamin D, 25-Hydroxy: 25.04 - Low (10/24/20) Vitamin E: 6.6 - Low (10/30/20)  NUTRITION - FOCUSED PHYSICAL EXAM: Flowsheet Row Most Recent Value  Orbital Region Moderate depletion  Upper Arm Region Severe depletion  Thoracic and Lumbar Region Severe depletion  Buccal Region Severe depletion  Temple Region Severe depletion  Clavicle Bone Region Severe depletion  Clavicle and Acromion Bone Region Severe depletion  Scapular Bone Region Severe depletion  Dorsal Hand Moderate depletion  Patellar Region Moderate depletion  Anterior Thigh Region Moderate depletion  Posterior Calf Region Severe depletion  Edema (RD Assessment) None  Hair Reviewed  Eyes Reviewed  Mouth Reviewed  Skin Reviewed  Nails Reviewed   Diet Order:   Diet Order             Diet clear liquid Room service appropriate? Yes; Fluid consistency: Thin  Diet effective now  EDUCATION NEEDS:  No education needs have been identified at this time  Skin:  Skin Assessment: Reviewed RN Assessment  Last BM:  11/15/20  Height:  Ht Readings from Last 1 Encounters:  11/14/20 5\' 9"  (1.753 m)   Weight:  Wt Readings from Last 1  Encounters:  11/14/20 68 kg   BMI:  Body mass index is 22.15 kg/m.  Estimated Nutritional Needs:  Kcal:  1700-1900 Protein:  85-100 grams Fluid:  >1.7 L  Derrel Nip, RD, LDN (she/her/hers) Registered Dietitian I After-Hours/Weekend Pager # in [place]

## 2020-11-16 NOTE — Plan of Care (Signed)
?  Problem: Education: ?Goal: Knowledge of General Education information will improve ?Description: Including pain rating scale, medication(s)/side effects and non-pharmacologic comfort measures ?Outcome: Progressing ?  ?Problem: Health Behavior/Discharge Planning: ?Goal: Ability to manage health-related needs will improve ?Outcome: Progressing ?  ?Problem: Clinical Measurements: ?Goal: Ability to maintain clinical measurements within normal limits will improve ?Outcome: Progressing ?  ?Problem: Activity: ?Goal: Risk for activity intolerance will decrease ?Outcome: Progressing ?  ?Problem: Coping: ?Goal: Level of anxiety will decrease ?Outcome: Progressing ?  ?Problem: Pain Managment: ?Goal: General experience of comfort will improve ?Outcome: Progressing ?  ?Problem: Safety: ?Goal: Ability to remain free from injury will improve ?Outcome: Progressing ?  ?Problem: Skin Integrity: ?Goal: Risk for impaired skin integrity will decrease ?Outcome: Progressing ?  ?

## 2020-11-16 NOTE — TOC Initial Note (Signed)
Transition of Care [facility]) - Initial/Assessment Note    Patient Details  Name: [name] MRN: [id number] Date of Birth: January 20, 1971  [facility]) CM/SW Contact:    Bartholomew Crews, RN Phone Number: [phone] 11/16/2020, 5:46 PM  Clinical Narrative:                  Spoke with patient at the bedside. His mom arrived to visit during conversation. Patient interested in going to inpatient alcohol rehabilitation program. Provided substance abuse resources. Advised that TOC does not place patients into alcohol rehab centers. Patient and mom accepted resources.   Expected Discharge Plan: Home/Self Care Barriers to Discharge: Continued Medical Work up   Patient Goals and CMS Choice Patient states their goals for this hospitalization and ongoing recovery are:: wants an inpatient alcohol rehab center      Expected Discharge Plan and Services Expected Discharge Plan: Home/Self Care                                              Prior Living Arrangements/Services                       Activities of Daily Living Home Assistive Devices/Equipment: Blood pressure cuff ADL Screening (condition at time of admission) Patient's cognitive ability adequate to safely complete daily activities?: Yes Is the patient deaf or have difficulty hearing?: No Does the patient have difficulty seeing, even when wearing glasses/contacts?: No Does the patient have difficulty concentrating, remembering, or making decisions?: No Patient able to express need for assistance with ADLs?: Yes Does the patient have difficulty dressing or bathing?: No Independently performs ADLs?: Yes (appropriate for developmental age) Does the patient have difficulty walking or climbing stairs?: No Weakness of Legs: Both Weakness of Arms/Hands: Both  Permission Sought/Granted                  Emotional Assessment              Admission diagnosis:  Acute pancreatitis  [K85.90] Alcohol-induced acute pancreatitis without infection or necrosis [K85.20] Patient Active Problem List   Diagnosis Date Noted   Acute pancreatitis 11/15/2020   Seizure ([place]) 11/13/2018   Acute on chronic pancreatitis ([place]) 09/28/2017   Normocytic anemia 12/05/2016   Alcohol use disorder, severe, dependence ([place]) 07/25/2016   Cocaine use disorder, severe, dependence ([place]) 07/25/2016   Major depressive disorder, recurrent severe without psychotic features ([place]) 07/20/2016   Chronic pancreatitis ([place]) 05/18/2015   Essential hypertension 02/06/2014   Mood disorder ([place]) 02/06/2014   Pancreatic pseudocyst/cyst 11/25/2013   [name] White pattern seen on electrocardiogram 10/03/2012   TOBACCO ABUSE 03/23/2007   PCP:  Simona Huh, NP Pharmacy:   [facility] [place] [address] Phone: [phone] Fax: [place], [place] - [address] AT [place] [address] Phone: [phone] Fax: [place], [place] AT [place] [address] Phone: [phone] Fax: [phone]     Social Determinants of Health (SDOH) Interventions    Readmission Risk Interventions Readmission Risk Prevention Plan 08/29/2019 10/15/2018  Transportation Screening Complete Complete  Medication Review (  RN Care Manager) Complete Complete  PCP or Specialist appointment within 3-5 days of discharge Complete Complete  HRI or Home Care Consult Complete Complete  SW Recovery Care/Counseling Consult Complete Complete  Palliative Care Screening Not Applicable Not [place] Not Applicable Not Applicable  Some recent data might be hidden

## 2020-11-17 ENCOUNTER — Inpatient Hospital Stay (HOSPITAL_COMMUNITY): Payer: 59

## 2020-11-17 DIAGNOSIS — K861 Other chronic pancreatitis: Secondary | ICD-10-CM | POA: Diagnosis not present

## 2020-11-17 DIAGNOSIS — E43 Unspecified severe protein-calorie malnutrition: Secondary | ICD-10-CM | POA: Insufficient documentation

## 2020-11-17 DIAGNOSIS — K859 Acute pancreatitis without necrosis or infection, unspecified: Secondary | ICD-10-CM | POA: Diagnosis not present

## 2020-11-17 LAB — CBC WITH DIFFERENTIAL/PLATELET
Abs Immature Granulocytes: 0.18 10*3/uL — ABNORMAL HIGH (ref 0.00–0.07)
Basophils Absolute: 0 10*3/uL (ref 0.0–0.1)
Basophils Relative: 0 %
Eosinophils Absolute: 0.1 10*3/uL (ref 0.0–0.5)
Eosinophils Relative: 0 %
HCT: 28.2 % — ABNORMAL LOW (ref 39.0–52.0)
Hemoglobin: 9.4 g/dL — ABNORMAL LOW (ref 13.0–17.0)
Immature Granulocytes: 1 %
Lymphocytes Relative: 8 %
Lymphs Abs: 1.5 10*3/uL (ref 0.7–4.0)
MCH: 30.7 pg (ref 26.0–34.0)
MCHC: 33.3 g/dL (ref 30.0–36.0)
MCV: 92.2 fL (ref 80.0–100.0)
Monocytes Absolute: 3.2 10*3/uL — ABNORMAL HIGH (ref 0.1–1.0)
Monocytes Relative: 17 %
Neutro Abs: 13.7 10*3/uL — ABNORMAL HIGH (ref 1.7–7.7)
Neutrophils Relative %: 74 %
Platelets: 385 10*3/uL (ref 150–400)
RBC: 3.06 MIL/uL — ABNORMAL LOW (ref 4.22–5.81)
RDW: 15.2 % (ref 11.5–15.5)
WBC: 18.7 10*3/uL — ABNORMAL HIGH (ref 4.0–10.5)
nRBC: 0 % (ref 0.0–0.2)

## 2020-11-17 LAB — COMPREHENSIVE METABOLIC PANEL
ALT: 12 U/L (ref 0–44)
AST: 17 U/L (ref 15–41)
Albumin: 2.5 g/dL — ABNORMAL LOW (ref 3.5–5.0)
Alkaline Phosphatase: 87 U/L (ref 38–126)
Anion gap: 9 (ref 5–15)
BUN: <5 mg/dL — ABNORMAL LOW (ref 6–20)
CO2: 27 mmol/L (ref 22–32)
Calcium: 8.6 mg/dL — ABNORMAL LOW (ref 8.9–10.3)
Chloride: 94 mmol/L — ABNORMAL LOW (ref 98–111)
Creatinine, Ser: 0.57 mg/dL — ABNORMAL LOW (ref 0.61–1.24)
GFR, Estimated: >60 mL/min (ref >60)
Glucose, Bld: 93 mg/dL (ref 70–99)
Potassium: 3.8 mmol/L (ref 3.5–5.1)
Sodium: 130 mmol/L — ABNORMAL LOW (ref 135–145)
Total Bilirubin: 0.5 mg/dL (ref 0.3–1.2)
Total Protein: 6.7 g/dL (ref 6.5–8.1)

## 2020-11-17 LAB — MAGNESIUM: Magnesium: 1.4 mg/dL — ABNORMAL LOW (ref 1.7–2.4)

## 2020-11-17 LAB — PHOSPHORUS: Phosphorus: 3.9 mg/dL (ref 2.5–4.6)

## 2020-11-17 LAB — URINALYSIS, ROUTINE W REFLEX MICROSCOPIC
Bilirubin Urine: NEGATIVE
Glucose, UA: NEGATIVE mg/dL
Hgb urine dipstick: NEGATIVE
Ketones, ur: NEGATIVE mg/dL
Leukocytes,Ua: NEGATIVE
Nitrite: NEGATIVE
Protein, ur: NEGATIVE mg/dL
Specific Gravity, Urine: 1.009 (ref 1.005–1.030)
pH: 7 (ref 5.0–8.0)

## 2020-11-17 LAB — SODIUM, URINE, RANDOM: Sodium, Ur: 141 mmol/L

## 2020-11-17 LAB — OSMOLALITY, URINE: Osmolality, Ur: 366 mOsm/kg (ref 300–900)

## 2020-11-17 LAB — TSH: TSH: 1.944 u[IU]/mL (ref 0.350–4.500)

## 2020-11-17 LAB — CORTISOL-AM, BLOOD: Cortisol - AM: 9.3 ug/dL (ref 6.7–22.6)

## 2020-11-17 MED ORDER — VITAMIN E 45 MG (100 UNIT) PO CAPS
400.0000 [IU] | ORAL_CAPSULE | Freq: Every day | ORAL | Status: DC
  Administered 2020-11-17 – 2020-11-21 (×5): 400 [IU] via ORAL
  Filled 2020-11-17 (×5): qty 4

## 2020-11-17 MED ORDER — VITAMIN D 25 MCG (1000 UNIT) PO TABS
1000.0000 [IU] | ORAL_TABLET | Freq: Every day | ORAL | Status: DC
  Administered 2020-11-17 – 2020-11-21 (×5): 1000 [IU] via ORAL
  Filled 2020-11-17 (×6): qty 1

## 2020-11-17 MED ORDER — FOLIC ACID 1 MG PO TABS
1.0000 mg | ORAL_TABLET | Freq: Every day | ORAL | Status: DC
  Administered 2020-11-17 – 2020-11-21 (×5): 1 mg via ORAL
  Filled 2020-11-17 (×5): qty 1

## 2020-11-17 NOTE — Progress Notes (Signed)
PVR done at 22ml. Internal med made aware with no new orders at this time. Pt remains alert/oriented in no apparent distress. No complaints.

## 2020-11-17 NOTE — Progress Notes (Signed)
   Subjective:  [name] is a 50 y/o male with chronic pancreatitis, gastritis, chronic bronchitis, poly-substance abuse, and alcohol use disorder, admitted for treatment of acute on chronic pancreatitis.  Pt continues to have abdominal pain that is limiting his appetite. He was able to tolerate some clear liquids yesterday without nausea and vomiting, and was able to consume 1 bottle of Ensure. Feels his abdominal pain might be worse overall but continues to be achy/crampy in the epigastric and left side. Also endorses that he feels like his "pancreas moves when [he] walks"  Also endorses urinary hesitancy, dribbling, night time urgency and sense of incomplete voiding. Reports normal, yellow colored urine and urinates up to 6 times per day. Denies dysuria.   Lastly, reports some bilateral back pain which he feels could be musculoskeletal. Not severe and described as soreness.  Objective:  Vital signs in last 24 hours: Vitals:   11/16/20 0855 11/16/20 1406 11/16/20 2149 11/17/20 0833  BP:  (!) 144/97 (!) 138/99 (!) 143/84  Pulse:  90 (!) 105 (!) 101  Resp:  18 16 17   Temp:  98.9 F (37.2 C) 99.3 F (37.4 C) 98.3 F (36.8 C)  TempSrc:  Oral Oral Oral  SpO2: 94% 100% 98% 96%  Weight:      Height:       Weight change:   Intake/Output Summary (Last 24 hours) at 11/17/2020 1331 Last data filed at 11/17/2020 0303 Gross per 24 hour  Intake 440.57 ml  Output 300 ml  Net 140.57 ml   General: Pleasant, well-appearing man sitting up in bed. No acute distress. Head: Normocephalic. Atraumatic. CV: RRR. No murmurs, rubs, or gallops. No LE edema Pulmonary: Lungs CTAB. Normal effort. No wheezing or rales. Abdominal: Soft, epigastric and left sided tenderness, nondistended. Normal bowel sounds. No CVA tenderness Extremities: Palpable radial and DP pulses. Normal ROM. Skin: Warm and dry. No obvious rash or lesions. Neuro: A&Ox3. Moves all extremities. Normal sensation. No focal  deficit. Psych: Normal mood and affect    Assessment/Plan:  Principal Problem:   Acute on chronic pancreatitis (HCC) Active Problems:   Pancreatic pseudocyst/cyst   Major depressive disorder, recurrent severe without psychotic features ([place])   Acute pancreatitis  Acute on chronic pancreatitis Pt overall stable as compared to yesterday. Continued abdominal pain but was able to tolerate one Boost yesterday without N/V. Continuing pain regimen.  -Creon TID w/ meals -40mg  Protonix -Sucralfate and Xylocaine -Continue 300mg  Gabapentin TID and 30mg  Duloxetine daily -Continue 10mg  Oxycodone q4hrs for moderate pain and 1mg  Dilaudid q6hrs for severe  Urinary hesitancy Symptoms consistent with BPH or other less common obstructions. No dysuria or gross hematuria.  -Ordered PVR -Ordered UA and Urine culture  Alcohol use disorder with polysubstance abuse Pt spoke with SW yesterday and was given forms to complete for inpatient alcohol rehab placement.  -Encouraged pt to fill out forms while in hospital  Hyponatremia After hyponatremia work up, pt was seen to respond well to NS challenge and will continue this for apparent hypovolemic hyponatremia likely secondary to GI losses. TSH was WNL. AM Cortisol was taken at 2am and therefore not revelatory.  -Continue IV NS  Anemia of chronic disease Improved 9.4. Normocytic with replete ferritin and B vitamins, and low serum iron. Likely related to chronic pancreatitis. Currently asymptomatic. -CBC tomorrow to continue monitoring. Will transfuse if Hgb>7 and with symptoms.     LOS: 2 days   London Pepper, Medical Student 11/17/2020, 1:31 PM

## 2020-11-18 ENCOUNTER — Inpatient Hospital Stay (HOSPITAL_COMMUNITY): Payer: 59

## 2020-11-18 DIAGNOSIS — K859 Acute pancreatitis without necrosis or infection, unspecified: Secondary | ICD-10-CM | POA: Diagnosis not present

## 2020-11-18 DIAGNOSIS — K861 Other chronic pancreatitis: Secondary | ICD-10-CM | POA: Diagnosis not present

## 2020-11-18 LAB — COMPREHENSIVE METABOLIC PANEL
ALT: 11 U/L (ref 0–44)
AST: 15 U/L (ref 15–41)
Albumin: 2.3 g/dL — ABNORMAL LOW (ref 3.5–5.0)
Alkaline Phosphatase: 75 U/L (ref 38–126)
Anion gap: 12 (ref 5–15)
BUN: <5 mg/dL — ABNORMAL LOW (ref 6–20)
CO2: 22 mmol/L (ref 22–32)
Calcium: 8.3 mg/dL — ABNORMAL LOW (ref 8.9–10.3)
Chloride: 94 mmol/L — ABNORMAL LOW (ref 98–111)
Creatinine, Ser: 0.46 mg/dL — ABNORMAL LOW (ref 0.61–1.24)
GFR, Estimated: >60 mL/min (ref >60)
Glucose, Bld: 101 mg/dL — ABNORMAL HIGH (ref 70–99)
Potassium: 3.4 mmol/L — ABNORMAL LOW (ref 3.5–5.1)
Sodium: 128 mmol/L — ABNORMAL LOW (ref 135–145)
Total Bilirubin: 0.5 mg/dL (ref 0.3–1.2)
Total Protein: 6.1 g/dL — ABNORMAL LOW (ref 6.5–8.1)

## 2020-11-18 LAB — CBC WITH DIFFERENTIAL/PLATELET
Abs Immature Granulocytes: 0.19 10*3/uL — ABNORMAL HIGH (ref 0.00–0.07)
Basophils Absolute: 0 10*3/uL (ref 0.0–0.1)
Basophils Relative: 0 %
Eosinophils Absolute: 0.1 10*3/uL (ref 0.0–0.5)
Eosinophils Relative: 0 %
HCT: 26.6 % — ABNORMAL LOW (ref 39.0–52.0)
Hemoglobin: 8.7 g/dL — ABNORMAL LOW (ref 13.0–17.0)
Immature Granulocytes: 1 %
Lymphocytes Relative: 7 %
Lymphs Abs: 1.4 10*3/uL (ref 0.7–4.0)
MCH: 29.8 pg (ref 26.0–34.0)
MCHC: 32.7 g/dL (ref 30.0–36.0)
MCV: 91.1 fL (ref 80.0–100.0)
Monocytes Absolute: 2.7 10*3/uL — ABNORMAL HIGH (ref 0.1–1.0)
Monocytes Relative: 15 %
Neutro Abs: 14.4 10*3/uL — ABNORMAL HIGH (ref 1.7–7.7)
Neutrophils Relative %: 77 %
Platelets: 394 10*3/uL (ref 150–400)
RBC: 2.92 MIL/uL — ABNORMAL LOW (ref 4.22–5.81)
RDW: 15.8 % — ABNORMAL HIGH (ref 11.5–15.5)
WBC: 18.7 10*3/uL — ABNORMAL HIGH (ref 4.0–10.5)
nRBC: 0 % (ref 0.0–0.2)

## 2020-11-18 LAB — URINE CULTURE: Culture: <10000 — AB

## 2020-11-18 MED ORDER — POTASSIUM CHLORIDE 20 MEQ PO PACK
40.0000 meq | PACK | Freq: Once | ORAL | Status: AC
  Administered 2020-11-18: 40 meq via ORAL
  Filled 2020-11-18: qty 2

## 2020-11-18 MED ORDER — PANCRELIPASE (LIP-PROT-AMYL) 36000-114000 UNITS PO CPEP
36000.0000 [IU] | ORAL_CAPSULE | Freq: Three times a day (TID) | ORAL | Status: DC | PRN
  Filled 2020-11-18: qty 1

## 2020-11-18 MED ORDER — PANCRELIPASE (LIP-PROT-AMYL) 36000-114000 UNITS PO CPEP
72000.0000 [IU] | ORAL_CAPSULE | Freq: Three times a day (TID) | ORAL | Status: DC | PRN
  Filled 2020-11-18: qty 2

## 2020-11-18 MED ORDER — POTASSIUM CHLORIDE 20 MEQ PO PACK: 40.0000 meq | PACK | Freq: Two times a day (BID) | ORAL | Status: DC

## 2020-11-18 NOTE — Progress Notes (Signed)
RT brought to my attention that pt was smoking in room as she seen a cloud of smoke in bathroom when pt was in there. When pt was asked if he was smoking he denied. Charge RN was made aware. MD was made aware and asked security to to come search pt belongings. Security came to room, and pt denied smoking. Said he had a vape before but sent home with his brother. It was reiterated to pt that he can not smoke a vape or cigarettes in the hospital.

## 2020-11-18 NOTE — Progress Notes (Signed)
Pt reported to be smoking in room by RT. When asked if pt was smoking in room he denied. MD made aware. Security called to assist with search of pt belongings. Security asked pt if he was smoking in room, pt admitted to [place], when asked for vape pen he said he gave it to his brother and he didn't have it anymore. It was explained to pt that he is not to smoke/vape in room and said he understood.

## 2020-11-18 NOTE — TOC Progression Note (Signed)
Transition of Care [facility]) - Progression Note    Patient Details  Name: [name] MRN: [id number] Date of Birth: 1970/10/19  [facility]) CM/SW Contact  Milinda Antis, [place] Phone Number: 11/18/2020, 4:36 PM  Clinical Narrative:     16:35- CSW received a message from the RN that the patient requested to speak with CSW.  CSW contacted the patient.  The patient requested Substance Use resources because the previous list that he was given was misplaced.    CSW will provide substance use resources to patient.        Expected Discharge Plan: Home/Self Care Barriers to Discharge: Continued Medical Work up  Expected Discharge Plan and Services Expected Discharge Plan: Home/Self Care                                               Social Determinants of Health (SDOH) Interventions    Readmission Risk Interventions Readmission Risk Prevention Plan 08/29/2019 10/15/2018  Transportation Screening Complete Complete  Medication Review Press photographer) Complete Complete  PCP or Specialist appointment within 3-5 days of discharge Complete Complete  HRI or [place] Complete Complete  SW Recovery Care/Counseling Consult Complete Complete  Palliative Care Screening Not Applicable Not [place] Not Applicable Not Applicable  Some recent data might be hidden

## 2020-11-18 NOTE — Progress Notes (Signed)
Cortrak Tube Team Note:  Consult received to place a post pyloric Cortrak feeding tube.   Pt refused tube placement at time of visit. Pt spoke with MD over his in room phone. Pt would like to try and eat to avoid tube placement.   Please re-consult cortrak team if needed.   Currently service is offered Monday, Wednesday, and Friday from 8 am - 4 pm. If a tube placement is needed outside of these hours please consult IR/fluro for tube placement.   Lockie Pares., RD, LDN, CNSC See AMiON for contact information

## 2020-11-18 NOTE — Progress Notes (Signed)
   Subjective:  [name] is a 50 y/o male with chronic pancreatitis, gastritis, chronic bronchitis, poly-substance abuse, and alcohol use disorder, admitted for treatment of acute on chronic pancreatitis.   Pt continues to have abdominal pain that is limiting his appetite. He has been able to tolerate some clear liquids over the last two days, in addition to 1 bottle of Ensure. After conversation of NG tube placement, pt expresses desire to eat better and believes he can do so. Endorses some mild nausea but no vomiting. Has been receiving prn pain medications at each interval consistently this admission, also asking three times during visit for earlier dose of next medication despite answer of expectation not to fully control pain and concern for respiratory depression in the setting of his COPD.  Objective:  Vital signs in last 24 hours: Vitals:   11/17/20 0833 11/17/20 1525 11/17/20 1950 11/18/20 0752  BP: (!) 143/84 (!) 152/96 (!) 146/98 (!) 150/92  Pulse: (!) 101 90 96 87  Resp: 17 17 (!) 22 17  Temp: 98.3 F (36.8 C) 99.3 F (37.4 C) 98.7 F (37.1 C) 98.5 F (36.9 C)  TempSrc: Oral Oral  Oral  SpO2: 96% 100% 100% 97%  Weight:      Height:       Weight change:   Intake/Output Summary (Last 24 hours) at 11/18/2020 1312 Last data filed at 11/17/2020 1330 Gross per 24 hour  Intake --  Output 400 ml  Net -400 ml   General: Pleasant, well-appearing gentleman sitting up in bed. No acute distress. Head: Normocephalic. Atraumatic. CV: RRR. No murmurs, rubs, or gallops. No LE edema Pulmonary: Lungs CTAB. Normal effort. No wheezing or rales. Abdominal: Soft, nontender with applied stethoscope pressure, nondistended. Normal bowel sounds. Extremities: Palpable radial and DP pulses. Normal ROM. Skin: Warm and dry. No obvious rash or lesions. Neuro: A&Ox3. Moves all extremities. Normal sensation. No focal deficit. Psych: Normal mood and affect    Assessment/Plan:  Principal Problem:   Acute on chronic pancreatitis (HCC) Active Problems:   Pancreatic pseudocyst/cyst   Major depressive disorder, recurrent severe without psychotic features ([place])   Acute pancreatitis   Protein-calorie malnutrition, severe  Acute on chronic pancreatitis WBC stable at 18.7k. Pt overall stable over the last several days. After conversation about possible NG tube placement, pt notes he thinks he can eat today. Has had electrolyte losses previously worked up, and responsive to fluids. -Creon TID w/ meals  -40mg  Protonix -Sucralfate and Xylocaine -300mg  Gabapentin TID and 30mg  Duloxetine -Continue 10mg  Oxycodone q4hrs for moderate pain and 0.5mg  Dilaudid q6hrs for severe -NS for hyponatremia and 59meq BID for hypokalemia.   Alcohol use disorder with polysubstance abuse Pt with forms to complete for inpatient alcohol rehab placement, given by SW.   Urinary hesitancy Concerns of urinary hesitancy and incomplete voiding without dysuria. UA normal. PVR with 222 mL. US Bladder and Prostate showed Prostate size WNL at 31mL. Reassured with normal bladder size and clean UA.  -F/u with outpatient for further workup    LOS: 3 days   London Pepper, Medical Student 11/18/2020, 1:12 PM

## 2020-11-19 DIAGNOSIS — R339 Retention of urine, unspecified: Secondary | ICD-10-CM | POA: Diagnosis not present

## 2020-11-19 DIAGNOSIS — K861 Other chronic pancreatitis: Secondary | ICD-10-CM | POA: Diagnosis not present

## 2020-11-19 DIAGNOSIS — K859 Acute pancreatitis without necrosis or infection, unspecified: Secondary | ICD-10-CM | POA: Diagnosis not present

## 2020-11-19 LAB — CBC WITH DIFFERENTIAL/PLATELET
Abs Immature Granulocytes: 0.11 10*3/uL — ABNORMAL HIGH (ref 0.00–0.07)
Basophils Absolute: 0 10*3/uL (ref 0.0–0.1)
Basophils Relative: 0 %
Eosinophils Absolute: 0.1 10*3/uL (ref 0.0–0.5)
Eosinophils Relative: 0 %
HCT: 25.9 % — ABNORMAL LOW (ref 39.0–52.0)
Hemoglobin: 8.5 g/dL — ABNORMAL LOW (ref 13.0–17.0)
Immature Granulocytes: 1 %
Lymphocytes Relative: 8 %
Lymphs Abs: 1.2 10*3/uL (ref 0.7–4.0)
MCH: 30.4 pg (ref 26.0–34.0)
MCHC: 32.8 g/dL (ref 30.0–36.0)
MCV: 92.5 fL (ref 80.0–100.0)
Monocytes Absolute: 2.6 10*3/uL — ABNORMAL HIGH (ref 0.1–1.0)
Monocytes Relative: 17 %
Neutro Abs: 11.5 10*3/uL — ABNORMAL HIGH (ref 1.7–7.7)
Neutrophils Relative %: 74 %
Platelets: 389 10*3/uL (ref 150–400)
RBC: 2.8 MIL/uL — ABNORMAL LOW (ref 4.22–5.81)
RDW: 15.9 % — ABNORMAL HIGH (ref 11.5–15.5)
WBC: 15.4 10*3/uL — ABNORMAL HIGH (ref 4.0–10.5)
nRBC: 0 % (ref 0.0–0.2)

## 2020-11-19 LAB — COMPREHENSIVE METABOLIC PANEL
ALT: 12 U/L (ref 0–44)
AST: 14 U/L — ABNORMAL LOW (ref 15–41)
Albumin: 2.2 g/dL — ABNORMAL LOW (ref 3.5–5.0)
Alkaline Phosphatase: 83 U/L (ref 38–126)
Anion gap: 8 (ref 5–15)
BUN: <5 mg/dL — ABNORMAL LOW (ref 6–20)
CO2: 24 mmol/L (ref 22–32)
Calcium: 8.1 mg/dL — ABNORMAL LOW (ref 8.9–10.3)
Chloride: 98 mmol/L (ref 98–111)
Creatinine, Ser: 0.48 mg/dL — ABNORMAL LOW (ref 0.61–1.24)
GFR, Estimated: >60 mL/min (ref >60)
Glucose, Bld: 121 mg/dL — ABNORMAL HIGH (ref 70–99)
Potassium: 3.5 mmol/L (ref 3.5–5.1)
Sodium: 130 mmol/L — ABNORMAL LOW (ref 135–145)
Total Bilirubin: 0.6 mg/dL (ref 0.3–1.2)
Total Protein: 6.1 g/dL — ABNORMAL LOW (ref 6.5–8.1)

## 2020-11-19 MED ORDER — LACTATED RINGERS IV SOLN: INTRAVENOUS | Status: DC

## 2020-11-19 NOTE — Plan of Care (Signed)
°  Problem: Clinical Measurements: °Goal: Ability to maintain clinical measurements within normal limits will improve °Outcome: Progressing °  °Problem: Nutrition: °Goal: Adequate nutrition will be maintained °Outcome: Progressing °  °Problem: Coping: °Goal: Level of anxiety will decrease °Outcome: Progressing °  °Problem: Elimination: °Goal: Will not experience complications related to bowel motility °Outcome: Progressing °  °Problem: Pain Managment: °Goal: General experience of comfort will improve °Outcome: Progressing °  °Problem: Safety: °Goal: Ability to remain free from injury will improve °Outcome: Progressing °  °

## 2020-11-19 NOTE — TOC Progression Note (Signed)
Transition of Care [facility]) - Progression Note    Patient Details  Name: [name] MRN: [id number] Date of Birth: 1970/10/17  Transition of Care [facility]) CM/SW Contact  Milinda Antis, [place] Phone Number: 11/19/2020, 10:51 AM  Clinical Narrative:    09:48-  CSW met with the patient at bedside to present Substance use resources.  The patient informed CSW that he wanted to go to an inpatient facility if possible.  CSW encouraged the patient to contact agencies as soon as possible in an effort to continue the  sobriety that the patient reports began on admission to the hospital  .    Expected Discharge Plan: Home/Self Care Barriers to Discharge: Continued Medical Work up  Expected Discharge Plan and Services Expected Discharge Plan: Home/Self Care                                               Social Determinants of Health (SDOH) Interventions    Readmission Risk Interventions Readmission Risk Prevention Plan 08/29/2019 10/15/2018  Transportation Screening Complete Complete  Medication Review Press photographer) Complete Complete  PCP or Specialist appointment within 3-5 days of discharge Complete Complete  HRI or [place] Complete Complete  SW Recovery Care/Counseling Consult Complete Complete  Palliative Care Screening Not Applicable Not [place] Not Applicable Not Applicable  Some recent data might be hidden

## 2020-11-19 NOTE — Progress Notes (Signed)
   Subjective:  [name] is a 50 y/o male with chronic pancreatitis, gastritis, chronic bronchitis, poly-substance abuse, and alcohol use disorder, admitted for treatment of acute on chronic pancreatitis.  Pt notes that his abdominal pain is mildly improved from yesterday. Since yesterday AM, he was able to eat more jello, chicken broth and consumed 1 bottle of Ensure. He is willing to try soft foods today. Reports belching and continued mild nausea, without vomiting. No fevers or changes in breathing.   Also continues having symptoms of incomplete voiding. Previously evaluated prostate size (during this admission) with [place] which was WNL.   Objective:  Vital signs in last 24 hours: Vitals:   11/18/20 0752 11/18/20 1348 11/18/20 2201 11/19/20 0832  BP: (!) 150/92 (!) 148/96 (!) 138/93 (!) 156/102  Pulse: 87 89 80 77  Resp: 17 19 16 18   Temp: 98.5 F (36.9 C) 98.9 F (37.2 C) 98.4 F (36.9 C) 98.1 F (36.7 C)  TempSrc: Oral Oral Oral Oral  SpO2: 97% 100% 99% 100%  Weight:      Height:       Weight change:   Intake/Output Summary (Last 24 hours) at 11/19/2020 1107 Last data filed at 11/18/2020 2203 Gross per 24 hour  Intake --  Output 175 ml  Net -175 ml   General: Pleasant, well-appearing gentleman sitting up in bed. No acute distress. Head: Normocephalic. Atraumatic. CV: RRR. No murmurs, rubs, or gallops. No LE edema Pulmonary: Lungs CTAB. Normal effort. No wheezing or rales. Abdominal: Soft, nontender, nondistended. Normal bowel sounds. Extremities: Palpable radial and DP pulses. Normal ROM. Skin: Warm and dry. No obvious rash or lesions. Neuro: A&Ox3. Moves all extremities. Normal sensation. No focal deficit. Psych: Normal mood and affect    Assessment/Plan:  Principal Problem:   Acute on chronic pancreatitis (HCC) Active Problems:   Pancreatic pseudocyst/cyst   Major depressive disorder, recurrent severe without psychotic features ([place])   Acute  pancreatitis   Protein-calorie malnutrition, severe  Acute on chronic pancreatitis Small clinical improvement since yesterday in terms of pain and ability to tolerate liquid nutrition. WBC decreased from 18.7k yesterday to 15.4k today; in light of yesterday's persisting leukocytosis did order CXR which showed minimal bibasilar atelectasis and blood cx which is normal on preliminary read. Has had electrolyte losses responsive to fluids. Pt will try for solid food. -Increasing to soft, solid food today -Creon w/ meals and Boost -40mg  Protonix -Sucralfate and Xylocaine -300mg  Gabapentin TID and 30mg  Duloxetine -10mg  Oxycodone q4hrs for moderate and 0.5mg  Dilaudid q6hrs for sever -NS for hyponatremia and 25meq BID for hypokalemia  Alcohol use disorder with polysubstance abuse Pt with forms to complete for inpatient alcohol rehab placement, given by SW who did f/u with the pt again this AM.  Incomplete voiding Concerns of urinary hesitancy and incomplete voiding without dysuria, has worsened during this admission. UA normal. PVR with 242mL remaining. Prostate size WNL via [place]. Likely due to pain regimen in light of worsening during admission with increase from his outpatient regimen. -Encouraged less frequent use of pain regimen as tolerated -Advised double voiding -Will order in and out cath -Further work up as outpatient after discharge     LOS: 4 days   London Pepper, Medical Student 11/19/2020, 11:07 AM

## 2020-11-20 DIAGNOSIS — R339 Retention of urine, unspecified: Secondary | ICD-10-CM | POA: Diagnosis not present

## 2020-11-20 DIAGNOSIS — K859 Acute pancreatitis without necrosis or infection, unspecified: Secondary | ICD-10-CM | POA: Diagnosis not present

## 2020-11-20 DIAGNOSIS — K861 Other chronic pancreatitis: Secondary | ICD-10-CM | POA: Diagnosis not present

## 2020-11-20 LAB — CBC WITH DIFFERENTIAL/PLATELET
Abs Immature Granulocytes: 0.07 10*3/uL (ref 0.00–0.07)
Basophils Absolute: 0 10*3/uL (ref 0.0–0.1)
Basophils Relative: 0 %
Eosinophils Absolute: 0 10*3/uL (ref 0.0–0.5)
Eosinophils Relative: 0 %
HCT: 31.2 % — ABNORMAL LOW (ref 39.0–52.0)
Hemoglobin: 10.1 g/dL — ABNORMAL LOW (ref 13.0–17.0)
Immature Granulocytes: 1 %
Lymphocytes Relative: 13 %
Lymphs Abs: 1.6 10*3/uL (ref 0.7–4.0)
MCH: 29.4 pg (ref 26.0–34.0)
MCHC: 32.4 g/dL (ref 30.0–36.0)
MCV: 91 fL (ref 80.0–100.0)
Monocytes Absolute: 1.6 10*3/uL — ABNORMAL HIGH (ref 0.1–1.0)
Monocytes Relative: 12 %
Neutro Abs: 9.3 10*3/uL — ABNORMAL HIGH (ref 1.7–7.7)
Neutrophils Relative %: 74 %
Platelets: 507 10*3/uL — ABNORMAL HIGH (ref 150–400)
RBC: 3.43 MIL/uL — ABNORMAL LOW (ref 4.22–5.81)
RDW: 16 % — ABNORMAL HIGH (ref 11.5–15.5)
WBC: 12.6 10*3/uL — ABNORMAL HIGH (ref 4.0–10.5)
nRBC: 0 % (ref 0.0–0.2)

## 2020-11-20 LAB — URINALYSIS, ROUTINE W REFLEX MICROSCOPIC
Bilirubin Urine: NEGATIVE
Glucose, UA: NEGATIVE mg/dL
Hgb urine dipstick: NEGATIVE
Ketones, ur: NEGATIVE mg/dL
Leukocytes,Ua: NEGATIVE
Nitrite: NEGATIVE
Protein, ur: NEGATIVE mg/dL
Specific Gravity, Urine: 1.006 (ref 1.005–1.030)
pH: 7 (ref 5.0–8.0)

## 2020-11-20 MED ORDER — GABAPENTIN 400 MG PO CAPS
400.0000 mg | ORAL_CAPSULE | Freq: Three times a day (TID) | ORAL | Status: DC
  Administered 2020-11-20 – 2020-11-21 (×2): 400 mg via ORAL
  Filled 2020-11-20 (×2): qty 1

## 2020-11-20 MED ORDER — OXYCODONE HCL 5 MG PO TABS: 10.0000 mg | ORAL_TABLET | ORAL | Status: DC | PRN

## 2020-11-20 MED ORDER — OXYCODONE HCL 5 MG PO TABS: 10.0000 mg | ORAL_TABLET | Freq: Four times a day (QID) | ORAL | Status: DC | PRN

## 2020-11-20 MED ORDER — OXYCODONE HCL 5 MG PO TABS
5.0000 mg | ORAL_TABLET | Freq: Four times a day (QID) | ORAL | Status: DC | PRN
  Administered 2020-11-20 – 2020-11-21 (×3): 5 mg via ORAL
  Filled 2020-11-20 (×3): qty 1

## 2020-11-20 NOTE — Consult Note (Signed)
   [facility] CM Inpatient Consult   11/20/2020  Ovid Nefi Musich 1970-05-16 [id number]  [facility] [[facility]] Patient: [facility]  Primary Care Provider:  Simona Huh, NP [facility]  Patient screened for hospitalization with noted extreme high risk score for unplanned readmission risk and with multiple ED visits to assess for potential [name] service needs for post hospital transition.  Review of patient's medical record reveals patient is for follow up for substance abuse needs.  Spoke with patient at bedside regarding [facility] services with his [facility] benefits. Began screening questions regarding SDOH and patient began to roll his eyes and answered abruptly, denied any needs for follow up and states, "I follow up with Romelle Starcher and that's all I need right now."  Plan:  Declined follow up with [facility]   For questions contact:   Natividad Brood, RN BSN [facility] Liaison  [phone] business mobile phone Toll free office [phone]  Fax number: [phone] [email or web address]

## 2020-11-20 NOTE — Progress Notes (Signed)
Bladder scan volume of 418mL. MD Court Joy paged. Order for foley to be placed.

## 2020-11-20 NOTE — Progress Notes (Signed)
Subjective:  Overnight Events: Patient retaining urine overnight   Patient reports his pain has been better controlled.  He has been tolerating food better without nausea or vomiting.  However overnight he has been bladder scan multiple times and retaining urine.  He denies any sexual intercourse in the last 6 months or penile discharge.  Before admission he was having some difficulty with urination, and worsened during this admission.  Objective:  Vital signs in last 24 hours: Vitals:   11/19/20 0832 11/19/20 1500 11/19/20 2055 11/20/20 0844  BP: (!) 156/102 (!) 141/98 135/87 (!) 135/91  Pulse: 77 80 78 75  Resp: 18 17 18    Temp: 98.1 F (36.7 C) 98.7 F (37.1 C) 98.3 F (36.8 C) 97.9 F (36.6 C)  TempSrc: Oral Oral Oral Oral  SpO2: 100% 96% 96% 100%  Weight:      Height:       Supplemental O2: Room Air SpO2: 100 %    Physical Exam:   Physical Exam Constitutional:      Appearance: He is well-developed.  Cardiovascular:     Rate and Rhythm: Normal rate and regular rhythm.  Pulmonary:     Breath sounds: Normal breath sounds. No wheezing, rhonchi or rales.  Abdominal:     General: Bowel sounds are normal. There is no distension.     Tenderness: There is abdominal tenderness (mild tenderness to palpation) in the left lower quadrant.  Genitourinary:    Prostate: Normal. Not enlarged (no nodules) and not tender.     Rectum: No mass.  Skin:    General: Skin is warm and dry.  Neurological:     General: No focal deficit present.     Mental Status: He is alert and oriented to person, place, and time.    Filed Weights   11/14/20 0630  Weight: 68 kg     Assessment/Plan:   Principal Problem:   Acute on chronic pancreatitis ([place]) Active Problems:   Pancreatic pseudocyst/cyst   Major depressive disorder, recurrent severe without psychotic features ([place])   Acute pancreatitis   Protein-calorie malnutrition, severe   Urinary retention   Patient  Summary: Hospital day 6 for [place] a 50 y.o. person living with alcohol use disorder and gastritis admitted for acute on chronic pancreatitis.  His hospital stay is complicated by acute urinary retention.   #Acute on chronic pancreatitis Patient is pain has been improving and he is tolerating some p.o. intake without nausea or vomiting.  Difficult because patient has real pain but also is at risk for addiction to opioid pain medications.  Patient's pain being treated with gabapentin added on this admission and he has a pain contract at [facility] for hydrocodone.  He follows with Dr. Havery Moros, GI.  Patient will need follow-up with both at discharge for further management of his chronic pancreatitis.   - Decrease oxycodone to Oxycodone 5 mg every 6 hours as needed - Increase gabapentin to 400 mg 3 times daily  #Leukocytosis #Urinary retention Patient with noted persistent leukocytosis without signs or symptoms of infection.  He has been having some dysuria and not retaining urine.  Urine culture on 7/12 showed  insignificant growth.  His WBC count continues to trend down today.  We will test for STDs and repeat urine culture ordered after prostate exam for further evaluation of possible prostatitis.  Although given patient is needing cath it may not be dependable for evaluation of prostatitis. Reviewed prior CT and ultrasound with no  noted abnormalities of prostate.  Prostate was normal size on ultrasound.  Urinary retention is most likely secondary to opioids. Also started on Cymbalta, will hold medication but this would be a very rare side effect from Cymbalta.  We will wean opioid pain medication to home equivalent.   Repeat UA unremarkable. -Follow-up urine culture -Follow-up PSA  Dispo: Anticipated discharge to Home in 1-2 days pending continued improvement in pain and further workup of urinary retention.   Tamsen Snider, MD PGY3 Internal Medicine Pager:  [phone] Please contact the on call pager after 5 pm and on weekends at [phone].

## 2020-11-21 LAB — COMPREHENSIVE METABOLIC PANEL
ALT: 12 U/L (ref 0–44)
AST: 18 U/L (ref 15–41)
Albumin: 2.1 g/dL — ABNORMAL LOW (ref 3.5–5.0)
Alkaline Phosphatase: 65 U/L (ref 38–126)
Anion gap: 10 (ref 5–15)
BUN: 5 mg/dL — ABNORMAL LOW (ref 6–20)
CO2: 23 mmol/L (ref 22–32)
Calcium: 8.4 mg/dL — ABNORMAL LOW (ref 8.9–10.3)
Chloride: 102 mmol/L (ref 98–111)
Creatinine, Ser: 0.6 mg/dL — ABNORMAL LOW (ref 0.61–1.24)
GFR, Estimated: >60 mL/min (ref >60)
Glucose, Bld: 133 mg/dL — ABNORMAL HIGH (ref 70–99)
Potassium: 3.8 mmol/L (ref 3.5–5.1)
Sodium: 135 mmol/L (ref 135–145)
Total Bilirubin: 0.3 mg/dL (ref 0.3–1.2)
Total Protein: 5.8 g/dL — ABNORMAL LOW (ref 6.5–8.1)

## 2020-11-21 LAB — CBC WITH DIFFERENTIAL/PLATELET
Abs Immature Granulocytes: 0.07 10*3/uL (ref 0.00–0.07)
Basophils Absolute: 0 10*3/uL (ref 0.0–0.1)
Basophils Relative: 0 %
Eosinophils Absolute: 0.1 10*3/uL (ref 0.0–0.5)
Eosinophils Relative: 1 %
HCT: 25.1 % — ABNORMAL LOW (ref 39.0–52.0)
Hemoglobin: 8.3 g/dL — ABNORMAL LOW (ref 13.0–17.0)
Immature Granulocytes: 1 %
Lymphocytes Relative: 17 %
Lymphs Abs: 1.8 10*3/uL (ref 0.7–4.0)
MCH: 30.1 pg (ref 26.0–34.0)
MCHC: 33.1 g/dL (ref 30.0–36.0)
MCV: 90.9 fL (ref 80.0–100.0)
Monocytes Absolute: 1.7 10*3/uL — ABNORMAL HIGH (ref 0.1–1.0)
Monocytes Relative: 16 %
Neutro Abs: 6.9 10*3/uL (ref 1.7–7.7)
Neutrophils Relative %: 65 %
Platelets: 416 10*3/uL — ABNORMAL HIGH (ref 150–400)
RBC: 2.76 MIL/uL — ABNORMAL LOW (ref 4.22–5.81)
RDW: 16.4 % — ABNORMAL HIGH (ref 11.5–15.5)
WBC: 10.6 10*3/uL — ABNORMAL HIGH (ref 4.0–10.5)
nRBC: 0 % (ref 0.0–0.2)

## 2020-11-21 LAB — URINE CULTURE: Culture: NO GROWTH

## 2020-11-21 MED ORDER — OXYCODONE HCL 5 MG PO TABS
5.0000 mg | ORAL_TABLET | ORAL | Status: DC | PRN
  Administered 2020-11-21: 5 mg via ORAL
  Filled 2020-11-21: qty 1

## 2020-11-21 MED ORDER — LIDOCAINE VISCOUS HCL 2 % MT SOLN: 15.0000 mL | Freq: Four times a day (QID) | OROMUCOSAL | 0 refills | Status: DC | PRN

## 2020-11-21 MED ORDER — PANTOPRAZOLE SODIUM 40 MG PO TBEC: 40.0000 mg | DELAYED_RELEASE_TABLET | Freq: Every day | ORAL | 0 refills | Status: DC

## 2020-11-21 MED ORDER — CHLORHEXIDINE GLUCONATE CLOTH 2 % EX PADS
6.0000 | MEDICATED_PAD | Freq: Every day | CUTANEOUS | Status: DC
  Administered 2020-11-21: 6 via TOPICAL

## 2020-11-21 NOTE — Progress Notes (Signed)
Pt discharged to home. DC instructions given with male family member at bedside. Pt voiced concerns re pain med and needing a prescription for same.  Reported that he had no pain med at home and needed a prescription. MD made aware. No prescription provided at this time per MD. Pt made aware. Pt Voided 100 ml of urine prior to discharge. Left unit ambulatory and stable with belongings placed in wheelchair and pushed by nurse tech.

## 2020-11-21 NOTE — Social Work (Signed)
Pt asked CSW for taxi voucher. CSW gave voucher nurse for discharge.  Emeterio Reeve, Latanya Presser, [place] Social Worker [phone]

## 2020-11-21 NOTE — Discharge Summary (Signed)
Name: [name] MRN: [id number] DOB: 1970/12/12 50 y.o. PCP: Simona Huh, NP  Date of Admission: 11/14/2020  6:26 AM Date of Discharge: 11/21/2020 Attending Physician: Sid Falcon, MD  Discharge Diagnosis: Acute on chronic pancreatitis Incomplete urinary voiding Alcohol use disorder Anemia of chronic disease  Discharge Medications: Allergies as of 11/21/2020       Reactions   Robaxin [methocarbamol] Other (See Comments)   "jumpy limbs"   Aspirin    Other reaction(s): Other (See Comments)   Shellfish-derived Products Nausea And Vomiting   Trazodone And Nefazodone Other (See Comments)   Muscle spasms   Adhesive [tape] Itching   Latex Itching   Toradol [ketorolac Tromethamine] Other (See Comments)   Has ulcers; cannot have this   Contrast Media [iodinated Diagnostic Agents] Hives   Reglan [metoclopramide] Other (See Comments)   Muscle spasms        Medication List     STOP taking these medications    HYDROmorphone 4 MG tablet Commonly known as: Dilaudid       TAKE these medications    acetaminophen 500 MG tablet Commonly known as: TYLENOL Take 500 mg by mouth every 6 (six) hours as needed for moderate pain or headache.   albuterol 108 (90 Base) MCG/ACT inhaler Commonly known as: VENTOLIN HFA Inhale 2 puffs into the lungs every 4 (four) hours as needed for wheezing or shortness of breath.   Breo Ellipta 200-25 MCG/INH Aepb Generic drug: fluticasone furoate-vilanterol Inhale 1 puff into the lungs daily.   folic acid 1 MG tablet Commonly known as: FOLVITE Take 1 tablet (1 mg total) by mouth daily.   lidocaine 2 % solution Commonly known as: XYLOCAINE Use as directed 15 mLs in the mouth or throat every 6 (six) hours as needed for mouth pain.   lipase/protease/amylase 36000 UNITS Cpep capsule Commonly known as: CREON Take 2 capsules (72,000 Units total) by mouth 3 (three) times daily with meals. For pancreatitis   magnesium oxide 400  MG tablet Commonly known as: MAG-OX Take 400 mg by mouth daily.   metoprolol 200 MG 24 hr tablet Commonly known as: TOPROL-XL Take 200 mg by mouth daily.   multivitamin with minerals Tabs tablet Take 1 tablet by mouth daily.   ondansetron 4 MG disintegrating tablet Commonly known as: ZOFRAN-ODT Take 4 mg by mouth 2 (two) times daily as needed for vomiting or nausea.   oxycodone-acetaminophen 5-300 MG tablet Commonly known as: LYNOX Take 1 tablet by mouth every 4 (four) hours as needed for pain.   pantoprazole 40 MG tablet Commonly known as: PROTONIX Take 1 tablet (40 mg total) by mouth daily.   potassium chloride 10 MEQ tablet Commonly known as: KLOR-CON Take 10 mEq by mouth daily.   QUEtiapine 50 MG tablet Commonly known as: SEROQUEL Take 50 mg by mouth at bedtime.   sertraline 100 MG tablet Commonly known as: ZOLOFT Take 1 tablet (100 mg total) by mouth daily.   sucralfate 1 g tablet Commonly known as: Carafate Take 1 tablet (1 g total) by mouth 4 (four) times daily -  with meals and at bedtime.   thiamine 100 MG tablet Take 1 tablet (100 mg total) by mouth daily.   verapamil 120 MG tablet Commonly known as: CALAN Take 120 mg by mouth daily.   Vitamin D (Ergocalciferol) 1.25 MG (50000 UNIT) Caps capsule Commonly known as: DRISDOL Take 50,000 Units by mouth every 7 (seven) days.   Vitamin D3 25 MCG tablet Commonly known  as: Vitamin D Take 1 tablet (1,000 Units total) by mouth daily.       ASK your doctor about these medications    omeprazole 20 MG capsule Commonly known as: PRILOSEC Take 20 mg by mouth daily.        Disposition and follow-up:   Mr.Tymeer Terry Bolotin was discharged from [facility] in Good condition.  At the hospital follow up visit please address:  1.  Ensure resolution of urinary retention after stopping IV pain regimen during admission. Prostate size WNL, UA and culture both normal.   2.  Labs / imaging  needed at time of follow-up: None  3.  Pending labs/ test needing follow-up: PSA (reflex to free). Blood culture final read (preliminary read was benign).  Follow-up Appointments:  [facility] Follow up.   Why: Open 24/7, no appointment required Contact information: [place], [place] [address] [phone]        [facility] Follow up.   Why: open 24 hours Contact information: [place],  [place], [place] [address]  [phone]        [facility] Follow up.   Contact information: [facility] [address] [facility] (near [place]) [address], [place], [place] [address]  [phone]        [facility]] Follow up.   Contact information: National helpline 1-800-662-HELP [phone])        [facility] Follow up.   Contact information: [address]  [place], [place] [address] [phone]                Hospital Course by problem list:  50 y/o male w/ chronic pancreatitis, gastritis, chronic bronchitis, alcohol use disorder, and polysubstance abuse admitted for acute on chronic pancreatitis shown on 7/5 CT.  Pt went to ED 5 times between 7/5 and 7/9 when he was admitted, leaving ED more than once without speaking to staff.  Provided supportive care throughout admission. Nausea and vomiting subsided in days 2-3, with abdominal pain slowly improving by day 4. Tolerating PO solid food by day 6-7.  Pt developed incomplete bladder voiding on hospital day 4 with PVR of 237mL. F/u w/ [place] prostate, Urine cx, and UA which were normal. Provided in and out cath for 2 days. Pt able to void well after cath removal on Day 8.  Developed electrolyte losses consistent with hypovolemic hyponatremia secondary to GI losses, and responded well to NS  challenge.  Dispo follow up: Acute on Chronic Pancreatitis Now able to tolerate PO food. Abdominal pain much more controlled and weaned down to outpatient regimen. Continue Creon w/ meals and Protonix daily.   Urinary retention PVR w/ 222mL in setting of normal size prostate, normal UA and urine culture. Suspect result of IV pain regimen for acute pancreatitis. F/u w/ Pcp for further work up if symptoms continue.  Alcohol misuse disorder Pt was given information to inpatient alcohol rehabilitation programs and began calling these programs as inpatient. He will continue looking for placement.   Discharge Exam:   BP (!) 145/91 (BP Location: Left Arm)   Pulse 68   Temp 98.7 F (37.1 C) (Oral)   Resp 17   Ht 5\' 9"  (1.753 m)   Wt 68 kg   SpO2 100%   BMI 22.15 kg/m  Discharge exam:  General: Pleasant, well-appearing gentleman sitting up in bed. No acute distress. Head: Normocephalic. Atraumatic. CV: RRR. No murmurs, rubs, or gallops. No LE edema Pulmonary: Lungs CTAB. Normal effort. No wheezing or rales. Abdominal: Soft, nontender, nondistended. Normal bowel sounds. Extremities: Palpable radial and DP pulses. Normal ROM. Skin: Warm and dry. No obvious rash or lesions. Neuro: A&Ox3. Moves all extremities. Normal sensation. No focal deficit. Psych: Normal mood and affect   Pertinent Labs, Studies, and Procedures:  No results found.   Discharge Instructions: Discharge Instructions     Call MD for:  persistant nausea and vomiting   Complete by: As directed    Call MD for:  severe uncontrolled pain   Complete by: As directed    Call MD for:  temperature >100.4   Complete by: As directed    Diet - low sodium heart healthy   Complete by: As directed    Increase activity slowly   Complete by: As directed        Signed: Montrice Montuori N, DO 11/21/2020, 12:46 PM

## 2020-11-22 ENCOUNTER — Other Ambulatory Visit: Payer: Self-pay

## 2020-11-22 ENCOUNTER — Emergency Department (HOSPITAL_COMMUNITY)
Admission: EM | Admit: 2020-11-22 | Discharge: 2020-11-22 | Disposition: A | Discharge status: Home / Self Care | Payer: 59 | Attending: Emergency Medicine | Admitting: Emergency Medicine

## 2020-11-22 ENCOUNTER — Encounter (HOSPITAL_COMMUNITY): Payer: Self-pay | Admitting: Emergency Medicine

## 2020-11-22 DIAGNOSIS — G8929 Other chronic pain: Secondary | ICD-10-CM | POA: Insufficient documentation

## 2020-11-22 DIAGNOSIS — I10 Essential (primary) hypertension: Secondary | ICD-10-CM | POA: Insufficient documentation

## 2020-11-22 DIAGNOSIS — Z7951 Long term (current) use of inhaled steroids: Secondary | ICD-10-CM | POA: Diagnosis not present

## 2020-11-22 DIAGNOSIS — F17210 Nicotine dependence, cigarettes, uncomplicated: Secondary | ICD-10-CM | POA: Insufficient documentation

## 2020-11-22 DIAGNOSIS — R1030 Lower abdominal pain, unspecified: Secondary | ICD-10-CM | POA: Diagnosis not present

## 2020-11-22 DIAGNOSIS — Z79899 Other long term (current) drug therapy: Secondary | ICD-10-CM | POA: Insufficient documentation

## 2020-11-22 DIAGNOSIS — Z91040 Latex allergy status: Secondary | ICD-10-CM | POA: Insufficient documentation

## 2020-11-22 DIAGNOSIS — R339 Retention of urine, unspecified: Secondary | ICD-10-CM | POA: Insufficient documentation

## 2020-11-22 DIAGNOSIS — J45909 Unspecified asthma, uncomplicated: Secondary | ICD-10-CM | POA: Insufficient documentation

## 2020-11-22 LAB — URINALYSIS, ROUTINE W REFLEX MICROSCOPIC
Bilirubin Urine: NEGATIVE
Glucose, UA: NEGATIVE mg/dL
Hgb urine dipstick: NEGATIVE
Ketones, ur: NEGATIVE mg/dL
Leukocytes,Ua: NEGATIVE
Nitrite: NEGATIVE
Protein, ur: NEGATIVE mg/dL
Specific Gravity, Urine: 1.002 — ABNORMAL LOW (ref 1.005–1.030)
pH: 7 (ref 5.0–8.0)

## 2020-11-22 LAB — PSA (REFLEX TO FREE) (SERIAL): Prostate Specific Ag, Serum: 3 ng/mL (ref 0.0–4.0)

## 2020-11-22 MED ORDER — MORPHINE SULFATE (PF) 4 MG/ML IV SOLN
8.0000 mg | Freq: Once | INTRAVENOUS | Status: AC
  Administered 2020-11-22: 8 mg via INTRAMUSCULAR
  Filled 2020-11-22: qty 2

## 2020-11-22 NOTE — ED Notes (Signed)
Pt refused vital signs.

## 2020-11-22 NOTE — ED Triage Notes (Addendum)
Per EMS, pt was just discharged from hospital.  Reports increased urination frequency (every 3-4 minutes) and painful urination.    92pulse 98% RA 156/88

## 2020-11-22 NOTE — Discharge Instructions (Addendum)
Call [facility] to arrange a follow-up appointment.  Their contact information has been provided in this discharge summary.  Continue taking your pain medications as previously prescribed.

## 2020-11-22 NOTE — ED Provider Notes (Signed)
[facility] EMERGENCY DEPARTMENT Provider Note   CSN: [id number] Arrival date & time: 11/22/20  0105     History Chief Complaint  Patient presents with   Urinary Frequency   Groin Pain    [name] is a 50 y.o. male.  Patient is a 50 year old male with past medical history of bipolar disorder, polysubstance abuse, GERD, chronic pancreatitis.  Patient is well-known to the emergency department for frequent visits involving chronic pain related to his pancreatitis.  Patient was just discharged yesterday after an admission for pancreatitis.  He he returns stating that it burns when he urinates and he is experiencing suprapubic pain.  He apparently retained urine while in the hospital and required catheterization on 2 occasions.  After returning home, the severe burning has caused him to return to the ER.  He denies any fevers or chills.  The history is provided by the patient.      Past Medical History:  Diagnosis Date   Alcoholism /alcohol abuse    Anemia    Anxiety    Arthritis    "knees; arms; elbows" (03/26/2015)   Asthma    Bipolar disorder (HCC)    Chronic bronchitis (HCC)    Chronic lower back pain    Chronic pancreatitis ([place])    Cocaine abuse ([place])    Depression    Family history of adverse reaction to anesthesia    "grandmother gets confused"   Femoral condyle fracture ([place]) 03/08/2014   left medial/notes 03/09/2014   GERD (gastroesophageal reflux disease)    H/O hiatal hernia    H/O suicide attempt 10/2012   High cholesterol    History of blood transfusion 10/2012   "when I tried to commit suicide"   History of stomach ulcers    Hypertension    Marijuana abuse, continuous    Migraine    "a few times/year" (03/26/2015)   Pneumonia 1990's X 3   PTSD (post-traumatic stress disorder)    Sickle cell trait ([place])    WPW (Wolff-Parkinson-White syndrome)    [name] 03/06/2013    Patient Active Problem List   Diagnosis Date Noted    Urinary retention    Protein-calorie malnutrition, severe 11/17/2020   Acute pancreatitis 11/15/2020   Seizure ([place]) 11/13/2018   Acute on chronic pancreatitis ([place]) 09/28/2017   Normocytic anemia 12/05/2016   Alcohol use disorder, severe, dependence ([place]) 07/25/2016   Cocaine use disorder, severe, dependence ([place]) 07/25/2016   Major depressive disorder, 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/2016   Chronic pancreatitis ([place]) 05/18/2015   Essential hypertension 02/06/2014   Mood disorder ([place]) 02/06/2014   Pancreatic pseudocyst/cyst 11/25/2013   [name] Parkinson White pattern seen on electrocardiogram 10/03/2012   TOBACCO ABUSE 03/23/2007    Past Surgical History:  Procedure Laterality Date   BIOPSY  11/25/2017   Procedure: BIOPSY;  Surgeon: Arta Silence, MD;  Location: [place];  Service: Endoscopy;;   BIOPSY  10/14/2018   Procedure: BIOPSY;  Surgeon: Arta Silence, MD;  Location: [place];  Service: Endoscopy;;   CARDIAC CATHETERIZATION     CYST ENTEROSTOMY  01/02/2020   Procedure: CYST ASPIRATION;  Surgeon: Milus Banister, MD;  Location: [facility];  Service: Endoscopy;;   ESOPHAGOGASTRODUODENOSCOPY (EGD) WITH PROPOFOL N/A 11/25/2017   Procedure: ESOPHAGOGASTRODUODENOSCOPY (EGD) WITH PROPOFOL;  Surgeon: Arta Silence, MD;  Location: [facility] ENDOSCOPY;  Service: Endoscopy;  Laterality: N/A;   ESOPHAGOGASTRODUODENOSCOPY (EGD) WITH PROPOFOL Left 10/14/2018   Procedure: ESOPHAGOGASTRODUODENOSCOPY (EGD) WITH PROPOFOL;  Surgeon: Arta Silence,  MD;  Location: [place] ENDOSCOPY;  Service: Endoscopy;  Laterality: Left;   ESOPHAGOGASTRODUODENOSCOPY (EGD) WITH PROPOFOL N/A 11/14/2018   Procedure: ESOPHAGOGASTRODUODENOSCOPY (EGD) WITH PROPOFOL;  Surgeon: Laurence Spates, MD;  Location: [facility];  Service: Gastroenterology;  Laterality: N/A;   ESOPHAGOGASTRODUODENOSCOPY (EGD) WITH PROPOFOL N/A 01/02/2020   Procedure: ESOPHAGOGASTRODUODENOSCOPY (EGD) WITH PROPOFOL;  Surgeon:  Milus Banister, MD;  Location: [facility];  Service: Endoscopy;  Laterality: N/A;   ESOPHAGOGASTRODUODENOSCOPY (EGD) WITH PROPOFOL N/A 10/25/2020   Procedure: ESOPHAGOGASTRODUODENOSCOPY (EGD) WITH PROPOFOL;  Surgeon: Rush Landmark Telford Nab., MD;  Location: [place];  Service: Gastroenterology;  Laterality: N/A;   EUS N/A 01/02/2020   Procedure: UPPER ENDOSCOPIC ULTRASOUND (EUS) RADIAL;  Surgeon: Milus Banister, MD;  Location: [facility];  Service: Endoscopy;  Laterality: N/A;   EYE SURGERY Left 1990's   "result of trauma"    FACIAL FRACTURE SURGERY Left 1990's   "result of trauma"    FLEXIBLE SIGMOIDOSCOPY N/A 10/25/2020   Procedure: FLEXIBLE SIGMOIDOSCOPY;  Surgeon: Irving Copas., MD;  Location: [place];  Service: Gastroenterology;  Laterality: N/A;   FRACTURE SURGERY     HEMOSTASIS CLIP PLACEMENT  10/25/2020   Procedure: HEMOSTASIS CLIP PLACEMENT;  Surgeon: Irving Copas., MD;  Location: [place];  Service: Gastroenterology;;   HERNIA REPAIR     LEFT HEART CATHETERIZATION WITH CORONARY ANGIOGRAM Right 03/07/2013   Procedure: LEFT HEART CATHETERIZATION WITH CORONARY ANGIOGRAM;  Surgeon: [name], MD;  Location: [place] CATH LAB;  Service: Cardiovascular;  Laterality: Right;   UMBILICAL HERNIA REPAIR     UPPER GASTROINTESTINAL ENDOSCOPY         Family History  Problem Relation Age of Onset   Hypertension Mother    Cirrhosis Father    Alcoholism Father    Hypertension Father    Melanoma Father    Hypertension Other    Coronary artery disease Other     Social History   Tobacco Use   Smoking status: Every Day    Packs/day: 1.00    Years: 36.00    Pack years: 36.00    Types: Cigarettes, E-cigarettes   Smokeless tobacco: Never  Vaping Use   Vaping Use: Former  Substance Use Topics   Alcohol use: Yes    Comment: last drink 8 days ago   Drug use: Yes    Types: Marijuana, Cocaine    Comment: daily marijuana use; last cocaine use about 3  months ago    Home Medications Prior to Admission medications   Medication Sig Start Date End Date Taking? Authorizing Provider  acetaminophen (TYLENOL) 500 MG tablet Take 500 mg by mouth every 6 (six) hours as needed for moderate pain or headache.    [provider]  albuterol (VENTOLIN HFA) 108 (90 Base) MCG/ACT inhaler Inhale 2 puffs into the lungs every 4 (four) hours as needed for wheezing or shortness of breath. 11/12/19   [name], DO  cholecalciferol (VITAMIN D) 25 MCG tablet Take 1 tablet (1,000 Units total) by mouth daily. 10/30/20   [name], MD  fluticasone furoate-vilanterol (BREO ELLIPTA) 200-25 MCG/INH AEPB Inhale 1 puff into the lungs daily. 11/12/19   [name], DO  folic acid (FOLVITE) 1 MG tablet Take 1 tablet (1 mg total) by mouth daily. 11/26/17   [name], MD  lidocaine (XYLOCAINE) 2 % solution Use as directed 15 mLs in the mouth or throat every 6 (six) hours as needed for mouth pain. 11/21/20   [name], [name], DO  lipase/protease/amylase (CREON) 36000 UNITS CPEP capsule Take 2 capsules (72,000 Units total) by mouth 3 (three) times daily with meals. For pancreatitis 11/05/19   [name], [name], MD  magnesium oxide (MAG-OX) 400 MG tablet Take 400 mg by mouth daily.    [provider]  metoprolol (TOPROL-XL) 200 MG 24 hr tablet Take 200 mg by mouth daily. 10/29/20   [provider]  Multiple Vitamin (MULTIVITAMIN WITH MINERALS) TABS tablet Take 1 tablet by mouth daily. 09/06/17   Bonnell Public, MD  omeprazole (PRILOSEC) 20 MG capsule Take 20 mg by mouth daily. Patient not taking: No sig reported    [provider]  ondansetron (ZOFRAN-ODT) 4 MG disintegrating tablet Take 4 mg by mouth 2 (two) times daily as needed for vomiting or nausea. 10/20/20   [provider]  oxycodone-acetaminophen (LYNOX) 5-300 MG tablet Take 1 tablet by mouth every 4 (four) hours as needed for pain.    [provider]   pantoprazole (PROTONIX) 40 MG tablet Take 1 tablet (40 mg total) by mouth daily. 11/21/20 12/21/20  [name], [name], DO  potassium chloride (KLOR-CON) 10 MEQ tablet Take 10 mEq by mouth daily.    [provider]  QUEtiapine (SEROQUEL) 50 MG tablet Take 50 mg by mouth at bedtime.    [provider]  sertraline (ZOLOFT) 100 MG tablet Take 1 tablet (100 mg total) by mouth daily. 03/27/20   [name], MD  sucralfate (CARAFATE) 1 g tablet Take 1 tablet (1 g total) by mouth 4 (four) times daily -  with meals and at bedtime. 05/23/20   Kerin Perna, NP  thiamine 100 MG tablet Take 1 tablet (100 mg total) by mouth daily. 10/16/18   [name], MD  verapamil (CALAN) 120 MG tablet Take 120 mg by mouth daily.    [provider]  Vitamin D, Ergocalciferol, (DRISDOL) 1.25 MG (50000 UNIT) CAPS capsule Take 50,000 Units by mouth every 7 (seven) days.    [provider]  amitriptyline (ELAVIL) 25 MG tablet Take 1 tablet (25 mg total) by mouth at bedtime. Patient not taking: Reported on 08/08/2019 10/15/18 08/08/19  [name], MD  gabapentin (NEURONTIN) 100 MG capsule Take 1 capsule (100 mg total) by mouth 2 (two) times daily. Patient not taking: Reported on 08/08/2019 10/15/18 08/08/19  [name], MD  promethazine (PHENERGAN) 25 MG tablet Take 1 tablet (25 mg total) by mouth every 6 (six) hours as needed for nausea or vomiting. Patient not taking: Reported on 08/08/2019 03/02/19 08/08/19  Kinnie Feil, PA-C    Allergies    Robaxin [methocarbamol], Aspirin, Shellfish-derived products, Trazodone and nefazodone, Adhesive [tape], Latex, Toradol [ketorolac tromethamine], Contrast media [iodinated diagnostic agents], and Reglan [metoclopramide]  Review of Systems   Review of Systems  All other systems reviewed and are negative.  Physical Exam Updated Vital Signs BP 115/73 (BP Location: Right Arm)   Pulse 81   Temp 98.2 F (36.8 C)   Resp 16   SpO2 99%    Physical Exam Vitals and nursing note reviewed.  Constitutional:      General: He is not in acute distress.    Appearance: He is well-developed. He is not diaphoretic.  HENT:     Head: Normocephalic and atraumatic.  Cardiovascular:     Rate and Rhythm: Normal rate and regular rhythm.     Heart sounds: No murmur heard.   No friction rub.  Pulmonary:     Effort: Pulmonary  effort is normal. No respiratory distress.     Breath sounds: Normal breath sounds. No wheezing or rales.  Abdominal:     General: Bowel sounds are normal. There is no distension.     Palpations: Abdomen is soft.     Tenderness: There is abdominal tenderness. There is no right CVA tenderness, left CVA tenderness, guarding or rebound.     Comments: There is suprapubic tenderness to palpation.  Musculoskeletal:        General: Normal range of motion.     Cervical back: Normal range of motion and neck supple.  Skin:    General: Skin is warm and dry.  Neurological:     Mental Status: He is alert and oriented to person, place, and time.     Coordination: Coordination normal.    ED Results / Procedures / Treatments   Labs (all labs ordered are listed, but only abnormal results are displayed) Labs Reviewed  URINALYSIS, ROUTINE W REFLEX MICROSCOPIC - Abnormal; Notable for the following components:      Result Value   Color, Urine COLORLESS (*)    Specific Gravity, Urine 1.002 (*)    All other components within normal limits    EKG None  Radiology No results found.  Procedures Procedures   Medications Ordered in ED Medications  morphine 4 MG/ML injection 8 mg (has no administration in time range)    ED Course  I have reviewed the triage vital signs and the nursing notes.  Pertinent labs & imaging results that were available during my care of the patient were reviewed by me and considered in my medical decision making (see chart for details).    MDM Rules/Calculators/A&P  Patient well-known to  the emergency department with chronic abdominal pain presenting with suprapubic pain and urinary retention.  He had to be catheterized twice during recent hospitalization.  A Foley catheter will be placed as his bladder scan shows 400 cc retained.  Foley will be left in place and patient is to follow-up with urology.  UA shows no evidence for UTI.  Final Clinical Impression(s) / ED Diagnoses Final diagnoses:  None    Rx / DC Orders ED Discharge Orders     None        Veryl Speak, MD 11/22/20 0501

## 2020-11-23 LAB — CULTURE, BLOOD (SINGLE)
Culture: NO GROWTH
Special Requests: ADEQUATE

## 2020-12-01 ENCOUNTER — Ambulatory Visit: Payer: 59 | Admitting: Physician Assistant

## 2020-12-01 ENCOUNTER — Emergency Department (HOSPITAL_COMMUNITY)
Admission: EM | Admit: 2020-12-01 | Discharge: 2020-12-01 | Disposition: A | Discharge status: Home / Self Care | Payer: 59 | Attending: Emergency Medicine | Admitting: Emergency Medicine

## 2020-12-01 ENCOUNTER — Emergency Department (HOSPITAL_COMMUNITY)
Admission: EM | Admit: 2020-12-01 | Discharge: 2020-12-02 | Disposition: A | Discharge status: Home / Self Care | Payer: 59 | Source: Home / Self Care | Attending: Emergency Medicine | Admitting: Emergency Medicine

## 2020-12-01 DIAGNOSIS — Z79899 Other long term (current) drug therapy: Secondary | ICD-10-CM | POA: Diagnosis not present

## 2020-12-01 DIAGNOSIS — F17210 Nicotine dependence, cigarettes, uncomplicated: Secondary | ICD-10-CM | POA: Insufficient documentation

## 2020-12-01 DIAGNOSIS — Z7951 Long term (current) use of inhaled steroids: Secondary | ICD-10-CM | POA: Diagnosis not present

## 2020-12-01 DIAGNOSIS — R1013 Epigastric pain: Secondary | ICD-10-CM | POA: Insufficient documentation

## 2020-12-01 DIAGNOSIS — J45909 Unspecified asthma, uncomplicated: Secondary | ICD-10-CM | POA: Insufficient documentation

## 2020-12-01 DIAGNOSIS — B9689 Other specified bacterial agents as the cause of diseases classified elsewhere: Secondary | ICD-10-CM | POA: Diagnosis not present

## 2020-12-01 DIAGNOSIS — I10 Essential (primary) hypertension: Secondary | ICD-10-CM | POA: Insufficient documentation

## 2020-12-01 DIAGNOSIS — Z91040 Latex allergy status: Secondary | ICD-10-CM | POA: Insufficient documentation

## 2020-12-01 DIAGNOSIS — N390 Urinary tract infection, site not specified: Secondary | ICD-10-CM | POA: Diagnosis not present

## 2020-12-01 DIAGNOSIS — D72829 Elevated white blood cell count, unspecified: Secondary | ICD-10-CM | POA: Insufficient documentation

## 2020-12-01 DIAGNOSIS — K859 Acute pancreatitis without necrosis or infection, unspecified: Secondary | ICD-10-CM

## 2020-12-01 DIAGNOSIS — R300 Dysuria: Secondary | ICD-10-CM | POA: Diagnosis present

## 2020-12-01 LAB — URINALYSIS, ROUTINE W REFLEX MICROSCOPIC
Bilirubin Urine: NEGATIVE
Glucose, UA: NEGATIVE mg/dL
Ketones, ur: NEGATIVE mg/dL
Leukocytes,Ua: NEGATIVE
Nitrite: POSITIVE — AB
Protein, ur: NEGATIVE mg/dL
Specific Gravity, Urine: 1.01 (ref 1.005–1.030)
pH: 6 (ref 5.0–8.0)

## 2020-12-01 LAB — I-STAT CHEM 8, ED
BUN: 9 mg/dL (ref 6–20)
Calcium, Ion: 1.17 mmol/L (ref 1.15–1.40)
Chloride: 103 mmol/L (ref 98–111)
Creatinine, Ser: 0.8 mg/dL (ref 0.61–1.24)
Glucose, Bld: 150 mg/dL — ABNORMAL HIGH (ref 70–99)
HCT: 33 % — ABNORMAL LOW (ref 39.0–52.0)
Hemoglobin: 11.2 g/dL — ABNORMAL LOW (ref 13.0–17.0)
Potassium: 5 mmol/L (ref 3.5–5.1)
Sodium: 138 mmol/L (ref 135–145)
TCO2: 28 mmol/L (ref 22–32)

## 2020-12-01 LAB — POC OCCULT BLOOD, ED: Fecal Occult Bld: NEGATIVE

## 2020-12-01 MED ORDER — LIDOCAINE VISCOUS HCL 2 % MT SOLN
15.0000 mL | Freq: Once | OROMUCOSAL | Status: AC
  Administered 2020-12-01: 15 mL via ORAL
  Filled 2020-12-01: qty 15

## 2020-12-01 MED ORDER — CEPHALEXIN 500 MG PO CAPS: 500.0000 mg | ORAL_CAPSULE | Freq: Two times a day (BID) | ORAL | 0 refills | Status: DC

## 2020-12-01 MED ORDER — CEPHALEXIN 500 MG PO CAPS
500.0000 mg | ORAL_CAPSULE | Freq: Once | ORAL | Status: AC
  Administered 2020-12-01: 500 mg via ORAL
  Filled 2020-12-01: qty 1

## 2020-12-01 MED ORDER — ALUM & MAG HYDROXIDE-SIMETH 200-200-20 MG/5ML PO SUSP
30.0000 mL | Freq: Once | ORAL | Status: AC
  Administered 2020-12-01: 30 mL via ORAL
  Filled 2020-12-01: qty 30

## 2020-12-01 MED ORDER — OXYCODONE-ACETAMINOPHEN 5-325 MG PO TABS
1.0000 | ORAL_TABLET | Freq: Once | ORAL | Status: AC
  Administered 2020-12-01: 1 via ORAL
  Filled 2020-12-01: qty 1

## 2020-12-01 NOTE — ED Triage Notes (Signed)
Pt here for exacerbation of chronic abd pain. Hx of same, hx of ETOH abuse, hx of alcoholic-induced pancreatitis. Pt c/o blood in urine, seen at [facility] for same, DC w abx. C/o pain radiating to chest, neck & shoulders. EMS found pt lying lateral, pt "trying to make it better." VSS

## 2020-12-01 NOTE — ED Triage Notes (Signed)
Pt states that he had urinary catheter placed about 10 days ago for retention. Pt states that last night he began to have flank pain and this morning he began to have bright red blood.

## 2020-12-01 NOTE — ED Provider Notes (Signed)
[facility] DEPT Provider Note   CSN: [id number] Arrival date & time: 12/01/20  1342     History Chief Complaint  Patient presents with   Dysuria    [name] is a 50 y.o. male.  Patient is a 50 year old male with a history of prior alcohol abuse, bipolar disorder, chronic abdominal pain, chronic pancreatitis and substance abuse who presents with blood in his urine.  He had a Foley catheter placed 10 days ago for urinary retention.  He has an appointment in 2 days with [facility] to have the catheter removed.  He states today he noticed some blood in his urine.  He says since that time the blood has cleared.  It seems to be draining well.  He did have some pressure in his lower abdomen but that has resolved.  He has his chronic pain which is in his epigastric region and left upper abdomen.  No vomiting.  No fevers.  He is followed by pain management for this.  He also reports that he had some blood in his stool today.  He has been seen for this in the past and has had prior endoscopies as well as colonoscopies.  He is followed by Dr. Enis Gash with gastroenterology.      Past Medical History:  Diagnosis Date   Alcoholism /alcohol abuse    Anemia    Anxiety    Arthritis    "knees; arms; elbows" (03/26/2015)   Asthma    Bipolar disorder (HCC)    Chronic bronchitis (HCC)    Chronic lower back pain    Chronic pancreatitis ([place])    Cocaine abuse ([place])    Depression    Family history of adverse reaction to anesthesia    "grandmother gets confused"   Femoral condyle fracture ([place]) 03/08/2014   left medial/notes 03/09/2014   GERD (gastroesophageal reflux disease)    H/O hiatal hernia    H/O suicide attempt 10/2012   High cholesterol    History of blood transfusion 10/2012   "when I tried to commit suicide"   History of stomach ulcers    Hypertension    Marijuana abuse, continuous    Migraine    "a few times/year" (03/26/2015)    Pneumonia 1990's X 3   PTSD (post-traumatic stress disorder)    Sickle cell trait ([place])    WPW (Wolff-Parkinson-White syndrome)    [name] 03/06/2013    Patient Active Problem List   Diagnosis Date Noted   Urinary retention    Protein-calorie malnutrition, severe 11/17/2020   Acute pancreatitis 11/15/2020   Seizure ([place]) 11/13/2018   Acute on chronic pancreatitis ([place]) 09/28/2017   Normocytic anemia 12/05/2016   Alcohol use disorder, severe, dependence ([place]) 07/25/2016   Cocaine use disorder, severe, dependence ([place]) 07/25/2016   Major depressive disorder, 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/2016   Chronic pancreatitis ([place]) 05/18/2015   Essential hypertension 02/06/2014   Mood disorder ([place]) 02/06/2014   Pancreatic pseudocyst/cyst 11/25/2013   [name] Parkinson White pattern seen on electrocardiogram 10/03/2012   TOBACCO ABUSE 03/23/2007    Past Surgical History:  Procedure Laterality Date   BIOPSY  11/25/2017   Procedure: BIOPSY;  Surgeon: Arta Silence, MD;  Location: [facility];  Service: Endoscopy;;   BIOPSY  10/14/2018   Procedure: BIOPSY;  Surgeon: Arta Silence, MD;  Location: [facility];  Service: Endoscopy;;   CARDIAC CATHETERIZATION     CYST ENTEROSTOMY  01/02/2020   Procedure: CYST ASPIRATION;  Surgeon: Owens Loffler  P, MD;  Location: [facility];  Service: Endoscopy;;   ESOPHAGOGASTRODUODENOSCOPY (EGD) WITH PROPOFOL N/A 11/25/2017   Procedure: ESOPHAGOGASTRODUODENOSCOPY (EGD) WITH PROPOFOL;  Surgeon: Arta Silence, MD;  Location: [facility];  Service: Endoscopy;  Laterality: N/A;   ESOPHAGOGASTRODUODENOSCOPY (EGD) WITH PROPOFOL Left 10/14/2018   Procedure: ESOPHAGOGASTRODUODENOSCOPY (EGD) WITH PROPOFOL;  Surgeon: Arta Silence, MD;  Location: [facility] ENDOSCOPY;  Service: Endoscopy;  Laterality: Left;   ESOPHAGOGASTRODUODENOSCOPY (EGD) WITH PROPOFOL N/A 11/14/2018   Procedure: ESOPHAGOGASTRODUODENOSCOPY (EGD) WITH PROPOFOL;  Surgeon: Laurence Spates, MD;  Location: [facility];  Service: Gastroenterology;  Laterality: N/A;   ESOPHAGOGASTRODUODENOSCOPY (EGD) WITH PROPOFOL N/A 01/02/2020   Procedure: ESOPHAGOGASTRODUODENOSCOPY (EGD) WITH PROPOFOL;  Surgeon: Milus Banister, MD;  Location: [facility];  Service: Endoscopy;  Laterality: N/A;   ESOPHAGOGASTRODUODENOSCOPY (EGD) WITH PROPOFOL N/A 10/25/2020   Procedure: ESOPHAGOGASTRODUODENOSCOPY (EGD) WITH PROPOFOL;  Surgeon: Rush Landmark Telford Nab., MD;  Location: [facility];  Service: Gastroenterology;  Laterality: N/A;   EUS N/A 01/02/2020   Procedure: UPPER ENDOSCOPIC ULTRASOUND (EUS) RADIAL;  Surgeon: Milus Banister, MD;  Location: [facility];  Service: Endoscopy;  Laterality: N/A;   EYE SURGERY Left 1990's   "result of trauma"    FACIAL FRACTURE SURGERY Left 1990's   "result of trauma"    FLEXIBLE SIGMOIDOSCOPY N/A 10/25/2020   Procedure: FLEXIBLE SIGMOIDOSCOPY;  Surgeon: Irving Copas., MD;  Location: [facility];  Service: Gastroenterology;  Laterality: N/A;   FRACTURE SURGERY     HEMOSTASIS CLIP PLACEMENT  10/25/2020   Procedure: HEMOSTASIS CLIP PLACEMENT;  Surgeon: Irving Copas., MD;  Location: [facility];  Service: Gastroenterology;;   HERNIA REPAIR     LEFT HEART CATHETERIZATION WITH CORONARY ANGIOGRAM Right 03/07/2013   Procedure: LEFT HEART CATHETERIZATION WITH CORONARY ANGIOGRAM;  Surgeon: [name], MD;  Location: [facility];  Service: Cardiovascular;  Laterality: Right;   UMBILICAL HERNIA REPAIR     UPPER GASTROINTESTINAL ENDOSCOPY         Family History  Problem Relation Age of Onset   Hypertension Mother    Cirrhosis Father    Alcoholism Father    Hypertension Father    Melanoma Father    Hypertension Other    Coronary artery disease Other     Social History   Tobacco Use   Smoking status: Every Day    Packs/day: 1.00    Years: 36.00    Pack years: 36.00    Types: Cigarettes, E-cigarettes   Smokeless tobacco: Never   Vaping Use   Vaping Use: Former  Substance Use Topics   Alcohol use: Yes    Comment: last drink 8 days ago   Drug use: Yes    Types: Marijuana, Cocaine    Comment: daily marijuana use; last cocaine use about 3 months ago    Home Medications Prior to Admission medications   Medication Sig Start Date End Date Taking? Authorizing Provider  cephALEXin (KEFLEX) 500 MG capsule Take 1 capsule (500 mg total) by mouth 2 (two) times daily. 12/01/20  Yes Malvin Johns, MD  acetaminophen (TYLENOL) 500 MG tablet Take 500 mg by mouth every 6 (six) hours as needed for moderate pain or headache.    [provider]  albuterol (VENTOLIN HFA) 108 (90 Base) MCG/ACT inhaler Inhale 2 puffs into the lungs every 4 (four) hours as needed for wheezing or shortness of breath. 11/12/19   [name], DO  cholecalciferol (VITAMIN D) 25 MCG tablet Take 1 tablet (1,000 Units total) by mouth daily. 10/30/20  [name], MD  fluticasone furoate-vilanterol (BREO ELLIPTA) 200-25 MCG/INH AEPB Inhale 1 puff into the lungs daily. 11/12/19   [name], DO  folic acid (FOLVITE) 1 MG tablet Take 1 tablet (1 mg total) by mouth daily. 11/26/17   [name], MD  lidocaine (XYLOCAINE) 2 % solution Use as directed 15 mLs in the mouth or throat every 6 (six) hours as needed for mouth pain. 11/21/20   [name], [name], DO  lipase/protease/amylase (CREON) 36000 UNITS CPEP capsule Take 2 capsules (72,000 Units total) by mouth 3 (three) times daily with meals. For pancreatitis 11/05/19   [name], [name], MD  magnesium oxide (MAG-OX) 400 MG tablet Take 400 mg by mouth daily.    [provider]  metoprolol (TOPROL-XL) 200 MG 24 hr tablet Take 200 mg by mouth daily. 10/29/20   [provider]  Multiple Vitamin (MULTIVITAMIN WITH MINERALS) TABS tablet Take 1 tablet by mouth daily. 09/06/17   Bonnell Public, MD  omeprazole (PRILOSEC) 20 MG capsule Take 20 mg by mouth daily. Patient not taking: No sig  reported    [provider]  ondansetron (ZOFRAN-ODT) 4 MG disintegrating tablet Take 4 mg by mouth 2 (two) times daily as needed for vomiting or nausea. 10/20/20   [provider]  oxycodone-acetaminophen (LYNOX) 5-300 MG tablet Take 1 tablet by mouth every 4 (four) hours as needed for pain.    [provider]  pantoprazole (PROTONIX) 40 MG tablet Take 1 tablet (40 mg total) by mouth daily. 11/21/20 12/21/20  [name], [name], DO  potassium chloride (KLOR-CON) 10 MEQ tablet Take 10 mEq by mouth daily.    [provider]  QUEtiapine (SEROQUEL) 50 MG tablet Take 50 mg by mouth at bedtime.    [provider]  sertraline (ZOLOFT) 100 MG tablet Take 1 tablet (100 mg total) by mouth daily. 03/27/20   [name], MD  sucralfate (CARAFATE) 1 g tablet Take 1 tablet (1 g total) by mouth 4 (four) times daily -  with meals and at bedtime. 05/23/20   Kerin Perna, NP  thiamine 100 MG tablet Take 1 tablet (100 mg total) by mouth daily. 10/16/18   [name], MD  verapamil (CALAN) 120 MG tablet Take 120 mg by mouth daily.    [provider]  Vitamin D, Ergocalciferol, (DRISDOL) 1.25 MG (50000 UNIT) CAPS capsule Take 50,000 Units by mouth every 7 (seven) days.    [provider]  amitriptyline (ELAVIL) 25 MG tablet Take 1 tablet (25 mg total) by mouth at bedtime. Patient not taking: Reported on 08/08/2019 10/15/18 08/08/19  [name], MD  gabapentin (NEURONTIN) 100 MG capsule Take 1 capsule (100 mg total) by mouth 2 (two) times daily. Patient not taking: Reported on 08/08/2019 10/15/18 08/08/19  [name], MD  promethazine (PHENERGAN) 25 MG tablet Take 1 tablet (25 mg total) by mouth every 6 (six) hours as needed for nausea or vomiting. Patient not taking: Reported on 08/08/2019 03/02/19 08/08/19  Kinnie Feil, PA-C    Allergies    Robaxin [methocarbamol], Aspirin, Shellfish-derived products, Trazodone and nefazodone, Adhesive [tape],  Latex, Toradol [ketorolac tromethamine], Contrast media [iodinated diagnostic agents], and Reglan [metoclopramide]  Review of Systems   Review of Systems  Constitutional:  Negative for chills, diaphoresis, fatigue and fever.  HENT:  Negative for congestion, rhinorrhea and sneezing.   Eyes: Negative.   Respiratory:  Negative for cough, chest tightness and shortness of breath.  Cardiovascular:  Negative for chest pain and leg swelling.  Gastrointestinal:  Positive for abdominal pain and blood in stool. Negative for diarrhea, nausea and vomiting.  Genitourinary:  Positive for hematuria. Negative for difficulty urinating, flank pain and frequency.  Musculoskeletal:  Negative for arthralgias and back pain.  Skin:  Negative for rash.  Neurological:  Negative for dizziness, speech difficulty, weakness, numbness and headaches.   Physical Exam Updated Vital Signs BP (!) 149/104   Pulse 76   Temp 98.3 F (36.8 C) (Oral)   Resp 16   Ht '5\' 9"'$  (1.753 m)   Wt 68 kg   SpO2 100%   BMI 22.14 kg/m   Physical Exam Constitutional:      Appearance: He is well-developed.  HENT:     Head: Normocephalic and atraumatic.  Eyes:     Pupils: Pupils are equal, round, and reactive to light.  Cardiovascular:     Rate and Rhythm: Normal rate and regular rhythm.     Heart sounds: Normal heart sounds.  Pulmonary:     Effort: Pulmonary effort is normal. No respiratory distress.     Breath sounds: Normal breath sounds. No wheezing or rales.  Chest:     Chest wall: No tenderness.  Abdominal:     General: Bowel sounds are normal.     Palpations: Abdomen is soft.     Tenderness: There is abdominal tenderness (Tenderness across the upper abdomen and left upper quadrant). There is no guarding or rebound.  Genitourinary:    Comments: Foley catheter in place.  There is some blood-tinged urine in the bag but it currently is draining clear yellow urine Musculoskeletal:        General: Normal range of motion.      Cervical back: Normal range of motion and neck supple.  Lymphadenopathy:     Cervical: No cervical adenopathy.  Skin:    General: Skin is warm and dry.     Findings: No rash.  Neurological:     Mental Status: He is alert and oriented to person, place, and time.    ED Results / Procedures / Treatments   Labs (all labs ordered are listed, but only abnormal results are displayed) Labs Reviewed  URINALYSIS, ROUTINE W REFLEX MICROSCOPIC - Abnormal; Notable for the following components:      Result Value   Color, Urine AMBER (*)    Hgb urine dipstick SMALL (*)    Nitrite POSITIVE (*)    Bacteria, UA RARE (*)    All other components within normal limits  I-STAT CHEM 8, ED - Abnormal; Notable for the following components:   Glucose, Bld 150 (*)    Hemoglobin 11.2 (*)    HCT 33.0 (*)    All other components within normal limits  POC OCCULT BLOOD, ED    EKG None  Radiology No results found.  Procedures Procedures   Medications Ordered in ED Medications  cephALEXin (KEFLEX) capsule 500 mg (has no administration in time range)  alum & mag hydroxide-simeth (MAALOX/MYLANTA) 200-200-20 MG/5ML suspension 30 mL (has no administration in time range)    And  lidocaine (XYLOCAINE) 2 % viscous mouth solution 15 mL (has no administration in time range)  oxyCODONE-acetaminophen (PERCOCET/ROXICET) 5-325 MG per tablet 1 tablet (1 tablet Oral Given 12/01/20 1513)    ED Course  I have reviewed the triage vital signs and the nursing notes.  Pertinent labs & imaging results that were available during my care of the patient were reviewed by  me and considered in my medical decision making (see chart for details).    MDM Rules/Calculators/A&P                           Patient is a 50 year old male who presents with an exacerbation of his chronic abdominal pain as well as some blood in his urine.  His urine has some suggestions of infection.  It is draining well.  There is no gross blood  in it currently.  He is afebrile.  He was given a dose of Percocet for his chronic abdominal pain.  He has managed to pain management for this.  There is no change in this I do not feel that he needs any further imaging.  Has had multiple CT scans in the past.  His labs show a normal creatinine.  Normal hemoglobin.  His rectal exam showed brown stool which was Hemoccult negative.  He was discharged home in good condition.  [facility] appointment [facility] on Thursday which is in 2 days.  He was started on Keflex.  Return precautions were given. Final Clinical Impression(s) / ED Diagnoses Final diagnoses:  Lower urinary tract infectious disease    Rx / DC Orders ED Discharge Orders          Ordered    cephALEXin (KEFLEX) 500 MG capsule  2 times daily        12/01/20 1641             Malvin Johns, MD 12/01/20 1643

## 2020-12-02 ENCOUNTER — Emergency Department (HOSPITAL_COMMUNITY): Payer: 59

## 2020-12-02 LAB — COMPREHENSIVE METABOLIC PANEL
ALT: 18 U/L (ref 0–44)
AST: 23 U/L (ref 15–41)
Albumin: 3.5 g/dL (ref 3.5–5.0)
Alkaline Phosphatase: 78 U/L (ref 38–126)
Anion gap: 10 (ref 5–15)
BUN: 9 mg/dL (ref 6–20)
CO2: 27 mmol/L (ref 22–32)
Calcium: 9.6 mg/dL (ref 8.9–10.3)
Chloride: 99 mmol/L (ref 98–111)
Creatinine, Ser: 0.56 mg/dL — ABNORMAL LOW (ref 0.61–1.24)
GFR, Estimated: >60 mL/min (ref >60)
Glucose, Bld: 88 mg/dL (ref 70–99)
Potassium: 3.7 mmol/L (ref 3.5–5.1)
Sodium: 136 mmol/L (ref 135–145)
Total Bilirubin: 0.5 mg/dL (ref 0.3–1.2)
Total Protein: 7.9 g/dL (ref 6.5–8.1)

## 2020-12-02 LAB — CBC WITH DIFFERENTIAL/PLATELET
Abs Immature Granulocytes: 0.05 10*3/uL (ref 0.00–0.07)
Basophils Absolute: 0 10*3/uL (ref 0.0–0.1)
Basophils Relative: 0 %
Eosinophils Absolute: 0.2 10*3/uL (ref 0.0–0.5)
Eosinophils Relative: 1 %
HCT: 35.6 % — ABNORMAL LOW (ref 39.0–52.0)
Hemoglobin: 11.5 g/dL — ABNORMAL LOW (ref 13.0–17.0)
Immature Granulocytes: 1 %
Lymphocytes Relative: 22 %
Lymphs Abs: 2.4 10*3/uL (ref 0.7–4.0)
MCH: 30.5 pg (ref 26.0–34.0)
MCHC: 32.3 g/dL (ref 30.0–36.0)
MCV: 94.4 fL (ref 80.0–100.0)
Monocytes Absolute: 0.5 10*3/uL (ref 0.1–1.0)
Monocytes Relative: 4 %
Neutro Abs: 7.8 10*3/uL — ABNORMAL HIGH (ref 1.7–7.7)
Neutrophils Relative %: 72 %
Platelets: 463 10*3/uL — ABNORMAL HIGH (ref 150–400)
RBC: 3.77 MIL/uL — ABNORMAL LOW (ref 4.22–5.81)
RDW: 19.6 % — ABNORMAL HIGH (ref 11.5–15.5)
WBC: 11 10*3/uL — ABNORMAL HIGH (ref 4.0–10.5)
nRBC: 0 % (ref 0.0–0.2)

## 2020-12-02 LAB — LIPASE, BLOOD: Lipase: 24 U/L (ref 11–51)

## 2020-12-02 MED ORDER — ALUM & MAG HYDROXIDE-SIMETH 200-200-20 MG/5ML PO SUSP
30.0000 mL | Freq: Once | ORAL | Status: AC
  Administered 2020-12-02: 30 mL via ORAL
  Filled 2020-12-02: qty 30

## 2020-12-02 MED ORDER — MORPHINE SULFATE (PF) 4 MG/ML IV SOLN
4.0000 mg | Freq: Once | INTRAVENOUS | Status: AC
  Administered 2020-12-02: 4 mg via INTRAVENOUS
  Filled 2020-12-02: qty 1

## 2020-12-02 MED ORDER — SODIUM CHLORIDE 0.9 % IV BOLUS (SEPSIS)
1000.0000 mL | Freq: Once | INTRAVENOUS | Status: AC
  Administered 2020-12-02: 1000 mL via INTRAVENOUS

## 2020-12-02 MED ORDER — OXYCODONE-ACETAMINOPHEN 5-325 MG PO TABS: 1.0000 | ORAL_TABLET | Freq: Four times a day (QID) | ORAL | 0 refills | Status: DC | PRN

## 2020-12-02 MED ORDER — SODIUM CHLORIDE 0.9 % IV SOLN
1000.0000 mL | INTRAVENOUS | Status: DC
  Administered 2020-12-02: 1000 mL via INTRAVENOUS

## 2020-12-02 MED ORDER — FENTANYL CITRATE (PF) 100 MCG/2ML IJ SOLN
50.0000 ug | Freq: Once | INTRAMUSCULAR | Status: AC
  Administered 2020-12-02: 50 ug via INTRAVENOUS
  Filled 2020-12-02: qty 2

## 2020-12-02 NOTE — Discharge Instructions (Addendum)
Follow a clear liquid diet for the next 2 to 3 days.  He may gradually advance diet to soft and then to regular as your pain improves.  Follow-up with your GI doctor within the week.  Return immediately to the ER if you have worsening pain fevers or cannot keep down any fluids.

## 2020-12-02 NOTE — ED Provider Notes (Signed)
[facility] EMERGENCY DEPARTMENT Provider Note  CSN: [id number] Arrival date & time: 12/01/20 2012  Chief Complaint(s) Abdominal Pain  HPI [name] is a 50 y.o. male with a past medical history listed below including chronic abdominal pain from pancreatitis who presents to the emergency department with exacerbation of his pain.  Described as burning and aching.  Worse with oral intake.  He endorsed vomiting after eating chicken noodle soup.  Endorses continued diarrhea.  Denies any recent alcohol use.   Abdominal Pain  Past Medical History Past Medical History:  Diagnosis Date  . Alcoholism /alcohol abuse   . Anemia   . Anxiety   . Arthritis    "knees; arms; elbows" (03/26/2015)  . Asthma   . Bipolar disorder ([place])   . Chronic bronchitis ([place])   . Chronic lower back pain   . Chronic pancreatitis ([place])   . Cocaine abuse ([place])   . Depression   . Family history of adverse reaction to anesthesia    "grandmother gets confused"  . Femoral condyle fracture ([place]) 03/08/2014   left medial/notes 03/09/2014  . GERD (gastroesophageal reflux disease)   . H/O hiatal hernia   . H/O suicide attempt 10/2012  . High cholesterol   . History of blood transfusion 10/2012   "when I tried to commit suicide"  . History of stomach ulcers   . Hypertension   . Marijuana abuse, continuous   . Migraine    "a few times/year" (03/26/2015)  . Pneumonia 1990's X 3  . PTSD (post-traumatic stress disorder)   . Sickle cell trait ([place])   . WPW (Wolff-Parkinson-White syndrome)    [name] 03/06/2013   Patient Active Problem List   Diagnosis Date Noted  . Urinary retention   . Protein-calorie malnutrition, severe 11/17/2020  . Acute pancreatitis 11/15/2020  . Seizure ([place]) 11/13/2018  . Acute on chronic pancreatitis ([place]) 09/28/2017  . Normocytic anemia 12/05/2016  . Alcohol use disorder, severe, dependence ([place]) 07/25/2016  . Cocaine use disorder, severe, dependence  ([place]) 07/25/2016  . Major depressive disorder, recurrent severe without psychotic features ([place]) 07/20/2016  . Chronic pancreatitis ([place]) 05/18/2015  . Essential hypertension 02/06/2014  . Mood disorder ([place]) 02/06/2014  . Pancreatic pseudocyst/cyst 11/25/2013  . 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 TOBACCO ABUSE 03/23/2007   Home Medication(s) Prior to Admission medications   Medication Sig Start Date End Date Taking? Authorizing Provider  acetaminophen (TYLENOL) 500 MG tablet Take 500 mg by mouth every 6 (six) hours as needed for moderate pain or headache.    [provider]  albuterol (VENTOLIN HFA) 108 (90 Base) MCG/ACT inhaler Inhale 2 puffs into the lungs every 4 (four) hours as needed for wheezing or shortness of breath. 11/12/19   [name], DO  cephALEXin (KEFLEX) 500 MG capsule Take 1 capsule (500 mg total) by mouth 2 (two) times daily. 12/01/20   Malvin Johns, MD  cholecalciferol (VITAMIN D) 25 MCG tablet Take 1 tablet (1,000 Units total) by mouth daily. 10/30/20   [name], MD  fluticasone furoate-vilanterol (BREO ELLIPTA) 200-25 MCG/INH AEPB Inhale 1 puff into the lungs daily. 11/12/19   [name], DO  folic acid (FOLVITE) 1 MG tablet Take 1 tablet (1 mg total) by mouth daily. 11/26/17   [name], MD  lidocaine (XYLOCAINE) 2 % solution Use as directed 15 mLs in the mouth or throat every 6 (six) hours as needed for mouth pain. 11/21/20   [name],  [name], DO  lipase/protease/amylase (CREON) 36000 UNITS CPEP capsule Take 2 capsules (72,000 Units total) by mouth 3 (three) times daily with meals. For pancreatitis 11/05/19   [name], [name], MD  magnesium oxide (MAG-OX) 400 MG tablet Take 400 mg by mouth daily.    [provider]  metoprolol (TOPROL-XL) 200 MG 24 hr tablet Take 200 mg by mouth daily. 10/29/20   [provider]  Multiple Vitamin (MULTIVITAMIN WITH MINERALS) TABS tablet Take 1 tablet by mouth  daily. 09/06/17   Bonnell Public, MD  omeprazole (PRILOSEC) 20 MG capsule Take 20 mg by mouth daily. Patient not taking: No sig reported    [provider]  ondansetron (ZOFRAN-ODT) 4 MG disintegrating tablet Take 4 mg by mouth 2 (two) times daily as needed for vomiting or nausea. 10/20/20   [provider]  oxycodone-acetaminophen (LYNOX) 5-300 MG tablet Take 1 tablet by mouth every 4 (four) hours as needed for pain.    [provider]  pantoprazole (PROTONIX) 40 MG tablet Take 1 tablet (40 mg total) by mouth daily. 11/21/20 12/21/20  [name], [name], DO  potassium chloride (KLOR-CON) 10 MEQ tablet Take 10 mEq by mouth daily.    [provider]  QUEtiapine (SEROQUEL) 50 MG tablet Take 50 mg by mouth at bedtime.    [provider]  sertraline (ZOLOFT) 100 MG tablet Take 1 tablet (100 mg total) by mouth daily. 03/27/20   [name], MD  sucralfate (CARAFATE) 1 g tablet Take 1 tablet (1 g total) by mouth 4 (four) times daily -  with meals and at bedtime. 05/23/20   Kerin Perna, NP  thiamine 100 MG tablet Take 1 tablet (100 mg total) by mouth daily. 10/16/18   [name], MD  verapamil (CALAN) 120 MG tablet Take 120 mg by mouth daily.    [provider]  Vitamin D, Ergocalciferol, (DRISDOL) 1.25 MG (50000 UNIT) CAPS capsule Take 50,000 Units by mouth every 7 (seven) days.    [provider]  amitriptyline (ELAVIL) 25 MG tablet Take 1 tablet (25 mg total) by mouth at bedtime. Patient not taking: Reported on 08/08/2019 10/15/18 08/08/19  [name], MD  gabapentin (NEURONTIN) 100 MG capsule Take 1 capsule (100 mg total) by mouth 2 (two) times daily. Patient not taking: Reported on 08/08/2019 10/15/18 08/08/19  [name], MD  promethazine (PHENERGAN) 25 MG tablet Take 1 tablet (25 mg total) by mouth every 6 (six) hours as needed for nausea or vomiting. Patient not taking: Reported on 08/08/2019 03/02/19 08/08/19  Arlean Hopping                                                                                                                                    Past Surgical History Past Surgical History:  Procedure Laterality Date  . BIOPSY  11/25/2017   Procedure: BIOPSY;  Surgeon: Paulita Fujita,  Gwyndolyn Saxon, MD;  [facility] ENDOSCOPY;  Service: Endoscopy;;  . BIOPSY  10/14/2018   Procedure: BIOPSY;  Surgeon: Arta Silence, MD;  Location: [place];  Service: Endoscopy;;  . CARDIAC CATHETERIZATION    . CYST ENTEROSTOMY  01/02/2020   Procedure: CYST ASPIRATION;  Surgeon: Milus Banister, MD;  Location: [facility] ENDOSCOPY;  Service: Endoscopy;;  . ESOPHAGOGASTRODUODENOSCOPY (EGD) WITH PROPOFOL N/A 11/25/2017   Procedure: ESOPHAGOGASTRODUODENOSCOPY (EGD) WITH PROPOFOL;  Surgeon: Arta Silence, MD;  Location: [place];  Service: Endoscopy;  Laterality: N/A;  . ESOPHAGOGASTRODUODENOSCOPY (EGD) WITH PROPOFOL Left 10/14/2018   Procedure: ESOPHAGOGASTRODUODENOSCOPY (EGD) WITH PROPOFOL;  Surgeon: Arta Silence, MD;  Location: [facility] ENDOSCOPY;  Service: Endoscopy;  Laterality: Left;  . ESOPHAGOGASTRODUODENOSCOPY (EGD) WITH PROPOFOL N/A 11/14/2018   Procedure: ESOPHAGOGASTRODUODENOSCOPY (EGD) WITH PROPOFOL;  Surgeon: Laurence Spates, MD;  Location: [facility] ENDOSCOPY;  Service: Gastroenterology;  Laterality: N/A;  . ESOPHAGOGASTRODUODENOSCOPY (EGD) WITH PROPOFOL N/A 01/02/2020   Procedure: ESOPHAGOGASTRODUODENOSCOPY (EGD) WITH PROPOFOL;  Surgeon: Milus Banister, MD;  Location: [facility] ENDOSCOPY;  Service: Endoscopy;  Laterality: N/A;  . ESOPHAGOGASTRODUODENOSCOPY (EGD) WITH PROPOFOL N/A 10/25/2020   Procedure: ESOPHAGOGASTRODUODENOSCOPY (EGD) WITH PROPOFOL;  Surgeon: Rush Landmark Telford Nab., MD;  Location: [place];  Service: Gastroenterology;  Laterality: N/A;  . EUS N/A 01/02/2020   Procedure: UPPER ENDOSCOPIC ULTRASOUND (EUS) RADIAL;  Surgeon: Milus Banister, MD;  Location: [facility] ENDOSCOPY;  Service: Endoscopy;  Laterality: N/A;  . EYE SURGERY  Left 1990's   "result of trauma"   . FACIAL FRACTURE SURGERY Left 1990's   "result of trauma"   . FLEXIBLE SIGMOIDOSCOPY N/A 10/25/2020   Procedure: FLEXIBLE SIGMOIDOSCOPY;  Surgeon: Rush Landmark Telford Nab., MD;  Location: [place];  Service: Gastroenterology;  Laterality: N/A;  . FRACTURE SURGERY    . HEMOSTASIS CLIP PLACEMENT  10/25/2020   Procedure: HEMOSTASIS CLIP PLACEMENT;  Surgeon: Irving Copas., MD;  Location: [place];  Service: Gastroenterology;;  . HERNIA REPAIR    . LEFT HEART CATHETERIZATION WITH CORONARY ANGIOGRAM Right 03/07/2013   Procedure: LEFT HEART CATHETERIZATION WITH CORONARY ANGIOGRAM;  Surgeon: [name], MD;  Location: [place] CATH LAB;  Service: Cardiovascular;  Laterality: Right;  . UMBILICAL HERNIA REPAIR    . UPPER GASTROINTESTINAL ENDOSCOPY     Family History Family History  Problem Relation Age of Onset  . Hypertension Mother   . Cirrhosis Father   . Alcoholism Father   . Hypertension Father   . Melanoma Father   . Hypertension Other   . Coronary artery disease Other     Social History Social History   Tobacco Use  . Smoking status: Every Day    Packs/day: 1.00    Years: 36.00    Pack years: 36.00    Types: Cigarettes, E-cigarettes  . Smokeless tobacco: Never  Vaping Use  . Vaping Use: Former  Substance Use Topics  . Alcohol use: Yes    Comment: last drink 8 days ago  . Drug use: Yes    Types: Marijuana, Cocaine    Comment: daily marijuana use; last cocaine use about 3 months ago   Allergies Robaxin [methocarbamol], Aspirin, Shellfish-derived products, Trazodone and nefazodone, Adhesive [tape], Latex, Toradol [ketorolac tromethamine], Contrast media [iodinated diagnostic agents], and Reglan [metoclopramide]  Review of Systems Review of Systems  Gastrointestinal:  Positive for abdominal pain.  All other systems are reviewed and are negative for acute change except as noted in the HPI  Physical Exam Vital Signs  I  have reviewed the triage vital signs BP (!) 151/96 (  BP Location: Right Arm)   Pulse 64   Temp 98 F (36.7 C) (Oral)   Resp 15   SpO2 100%   Physical Exam Vitals reviewed.  Constitutional:      General: He is not in acute distress.    Appearance: He is well-developed. He is not diaphoretic.  HENT:     Head: Normocephalic and atraumatic.     Right Ear: External ear normal.     Left Ear: External ear normal.     Nose: Nose normal.     Mouth/Throat:     Mouth: Mucous membranes are moist.  Eyes:     General: No scleral icterus.    Conjunctiva/sclera: Conjunctivae normal.  Neck:     Trachea: Phonation normal.  Cardiovascular:     Rate and Rhythm: Normal rate and regular rhythm.  Pulmonary:     Effort: Pulmonary effort is normal. No respiratory distress.     Breath sounds: No stridor.  Abdominal:     General: There is no distension.     Tenderness: There is abdominal tenderness (moderate) in the epigastric area.  Genitourinary:    Comments: Indwelling foley Musculoskeletal:        General: Normal range of motion.     Cervical back: Normal range of motion.  Neurological:     Mental Status: He is alert and oriented to person, place, and time.  Psychiatric:        Behavior: Behavior normal.    ED Results and Treatments Labs (all labs ordered are listed, but only abnormal results are displayed) Labs Reviewed  CBC WITH DIFFERENTIAL/PLATELET - Abnormal; Notable for the following components:      Result Value   WBC 11.0 (*)    RBC 3.77 (*)    Hemoglobin 11.5 (*)    HCT 35.6 (*)    RDW 19.6 (*)    Platelets 463 (*)    Neutro Abs 7.8 (*)    All other components within normal limits  COMPREHENSIVE METABOLIC PANEL - Abnormal; Notable for the following components:   Creatinine, Ser 0.56 (*)    All other components within normal limits  LIPASE, BLOOD                                                                                                                         EKG   EKG Interpretation  Date/Time:    Ventricular Rate:    PR Interval:    QRS Duration:   QT Interval:    QTC Calculation:   R Axis:     Text Interpretation:         Radiology No results found.  Pertinent labs & imaging results that were available during my care of the patient were reviewed by me and considered in my medical decision making (see chart for details).  Medications Ordered in ED Medications  sodium chloride 0.9 % bolus 1,000 mL (1,000 mLs Intravenous New Bag/Given 12/02/20 0725)    Followed by  0.9 %  sodium chloride infusion (1,000 mLs Intravenous New Bag/Given 12/02/20 0726)  alum & mag hydroxide-simeth (MAALOX/MYLANTA) 200-200-20 MG/5ML suspension 30 mL (30 mLs Oral Given 12/02/20 0726)  fentaNYL (SUBLIMAZE) injection 50 mcg (50 mcg Intravenous Given 12/02/20 0725)                                                                                                                                    Procedures Procedures  (including critical care time)  Medical Decision Making / ED Course I have reviewed the nursing notes for this encounter and the patient's prior records (if available in EHR or on provided paperwork).   [name] was evaluated in Emergency Department on 12/02/2020 for the symptoms described in the history of present illness. He was evaluated in the context of the global COVID-19 pandemic, which necessitated consideration that the patient might be at risk for infection with the SARS-CoV-2 virus that causes COVID-19. Institutional protocols and algorithms that pertain to the evaluation of patients at risk for COVID-19 are in a state of rapid change based on information released by regulatory bodies including the CDC and federal and state organizations. These policies and algorithms were followed during the patient's care in the ED.  Epigastric abdominal pain. CBC with mild leukocytosis around his baseline. No significant electrolyte derangements  or renal sufficiency.  Lipase negative. CT scan obtained given his previous normal lipase and acute on chronic findings on CT. On my read CT scan appears to be improved from July 5 of this year.  Awaiting formal radiology read.      Final Clinical Impression(s) / ED Diagnoses Final diagnoses:  None      This chart was dictated using voice recognition software.  Despite best efforts to proofread,  errors can occur which can change the documentation meaning.    Fatima Blank, MD 12/02/20 1806

## 2020-12-02 NOTE — ED Notes (Signed)
Pt ambulatory to restroom

## 2020-12-02 NOTE — ED Provider Notes (Signed)
Patient signed out to me is pending CT imaging.  CT imaging consistent with recurrent pancreatitis.  Patient has had multiple episodes of pancreatitis.  Given IV fluid resuscitation here.  Pain appears controlled.  We will give a prescription of Percocet to go home with.  Advise follow-up with his GI doctor within the week.  Advised immediate return if he is unable to keep down any fluids has worsening pain or fevers or any additional concerns to return immediately to the ER.    Luna Fuse, MD 12/02/20 1010

## 2020-12-23 IMAGING — DX DG CHEST 2V
2 series · 2 of 2 positions shown · non-contrast
Comparison: 05/30/2018.

CLINICAL DATA: Fall.  Injured right ribs.

EXAM:
CHEST - 2 VIEW

[w chest pa]
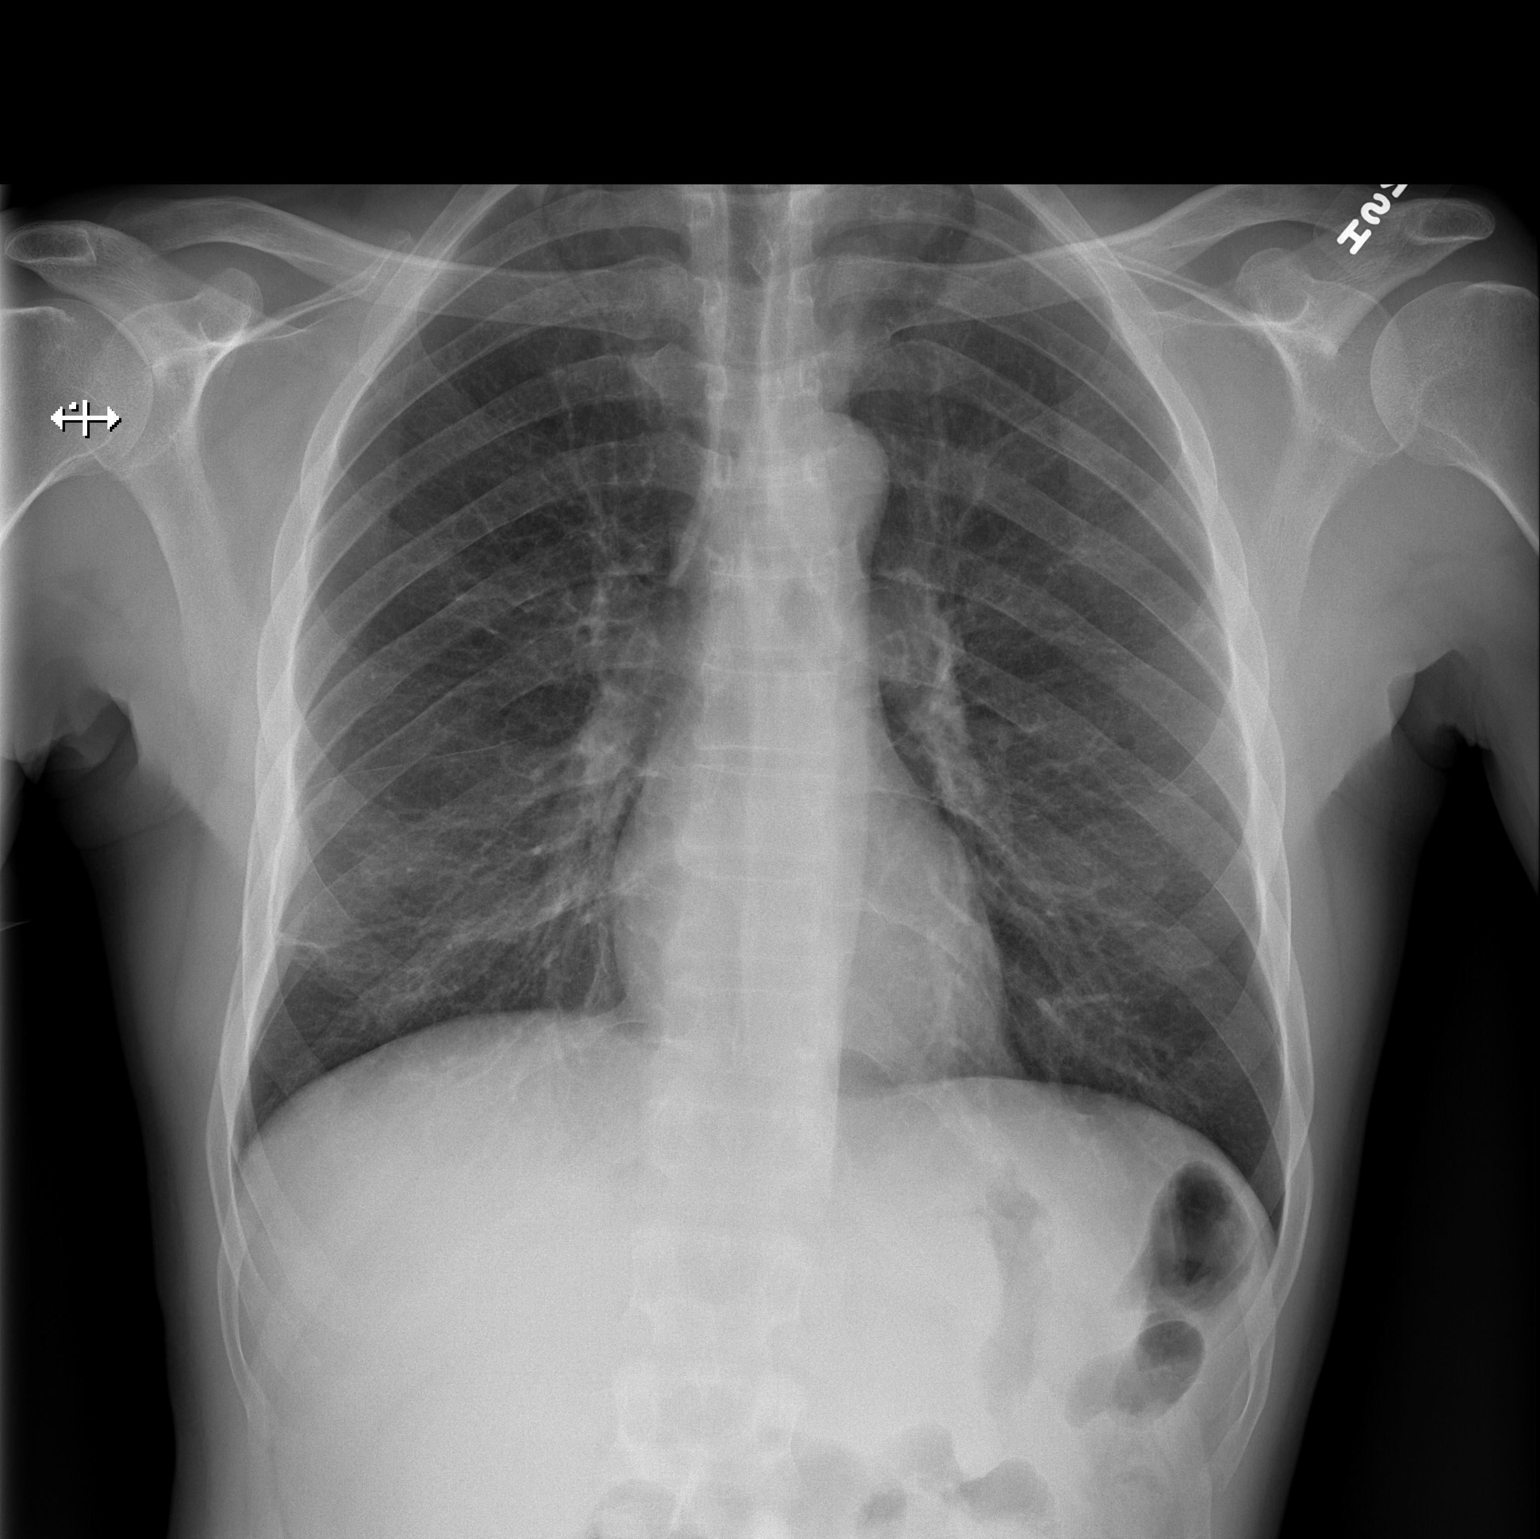

[w chest lat]
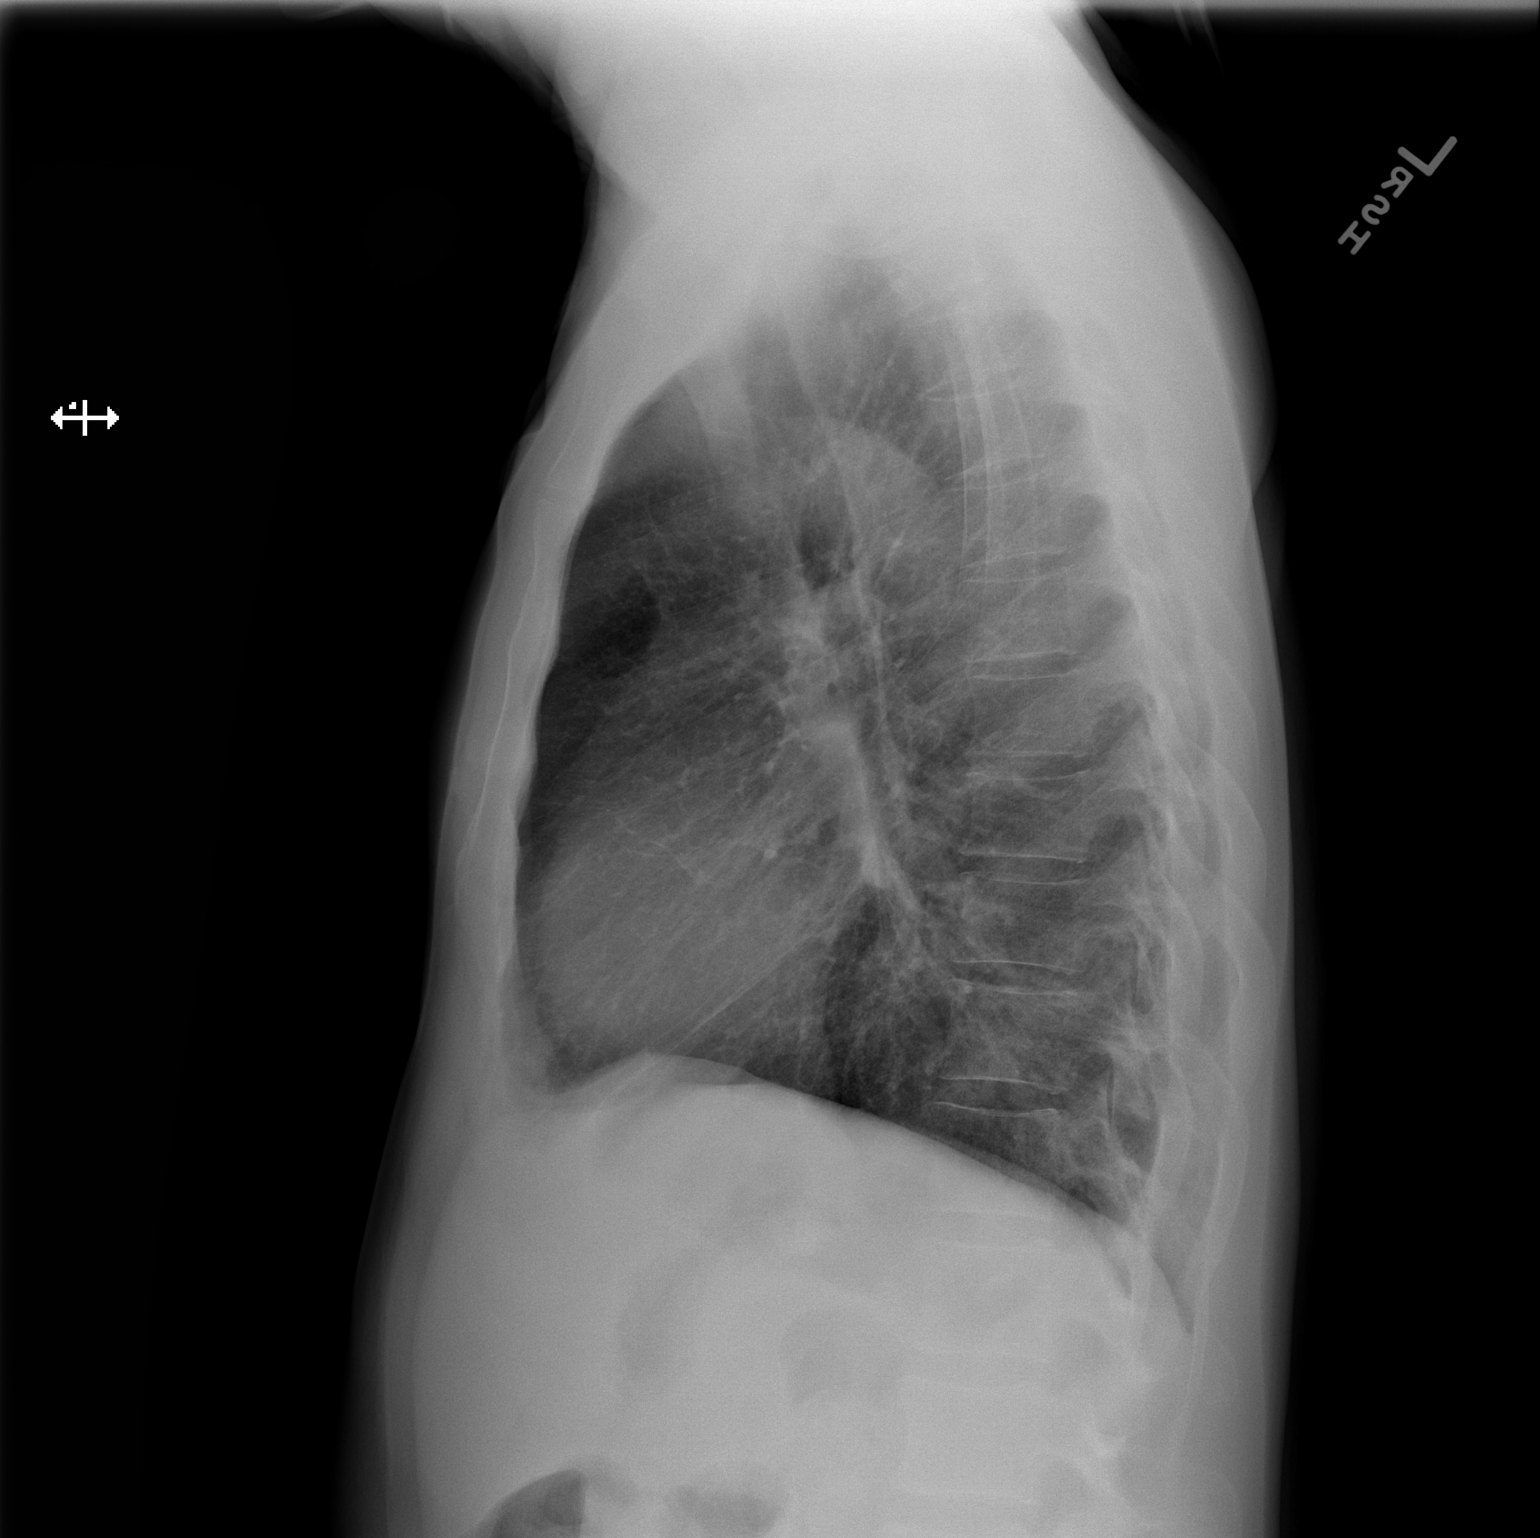

[2 of 2 positions shown; findings below may reference images not displayed]

FINDINGS: Mediastinum hilar structures normal. Low lung volumes with mild
bibasilar atelectasis. No pleural effusion or pneumothorax. No
evidence of displaced rib fracture.
IMPRESSION: Mild bibasilar subsegmental atelectasis. No evidence of displaced
rib fracture or pneumothorax.

## 2020-12-23 IMAGING — DX DG RIBS 2V*R*
2 series · 2 of 2 positions shown · non-contrast
Comparison: Two-view chest x-ray of the same day.

CLINICAL DATA: Fall yesterday.  Anterior rib pain.

EXAM:
RIGHT RIBS - 2 VIEW

[w ribs ap lower right]
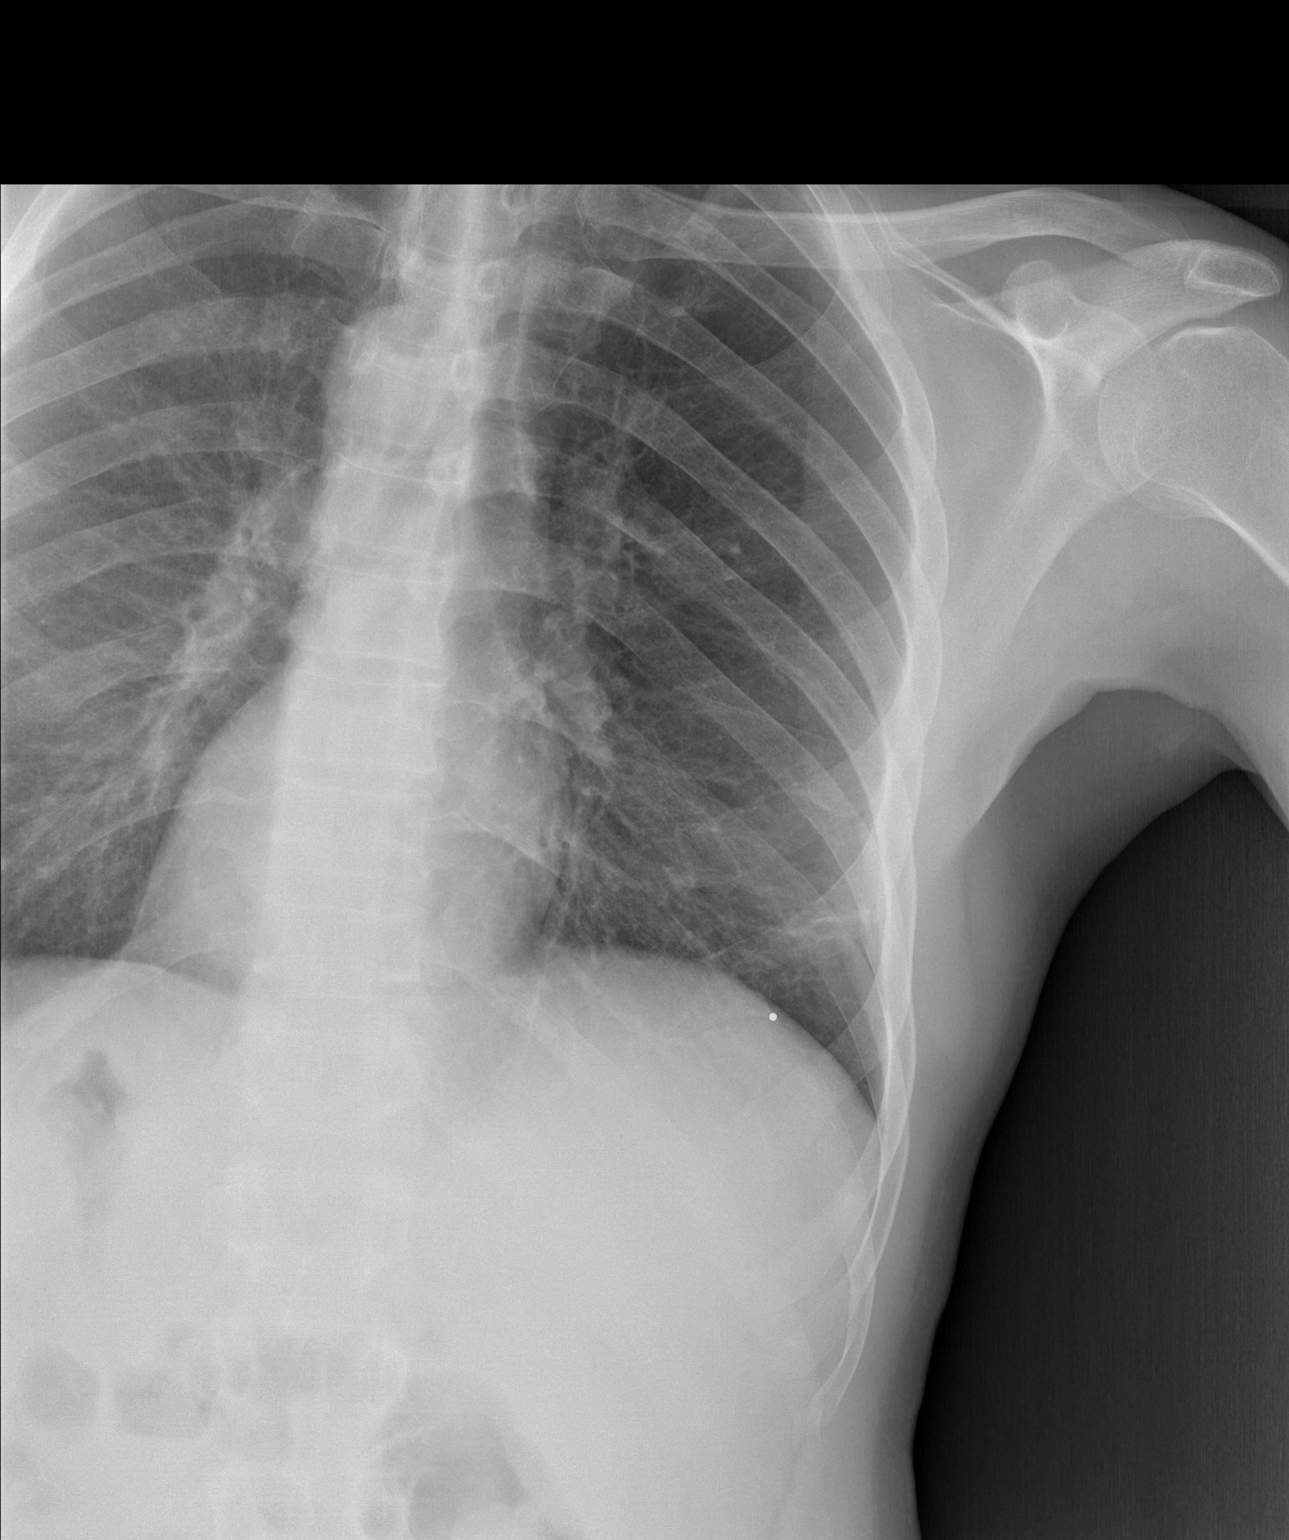

[w ribs obl right]
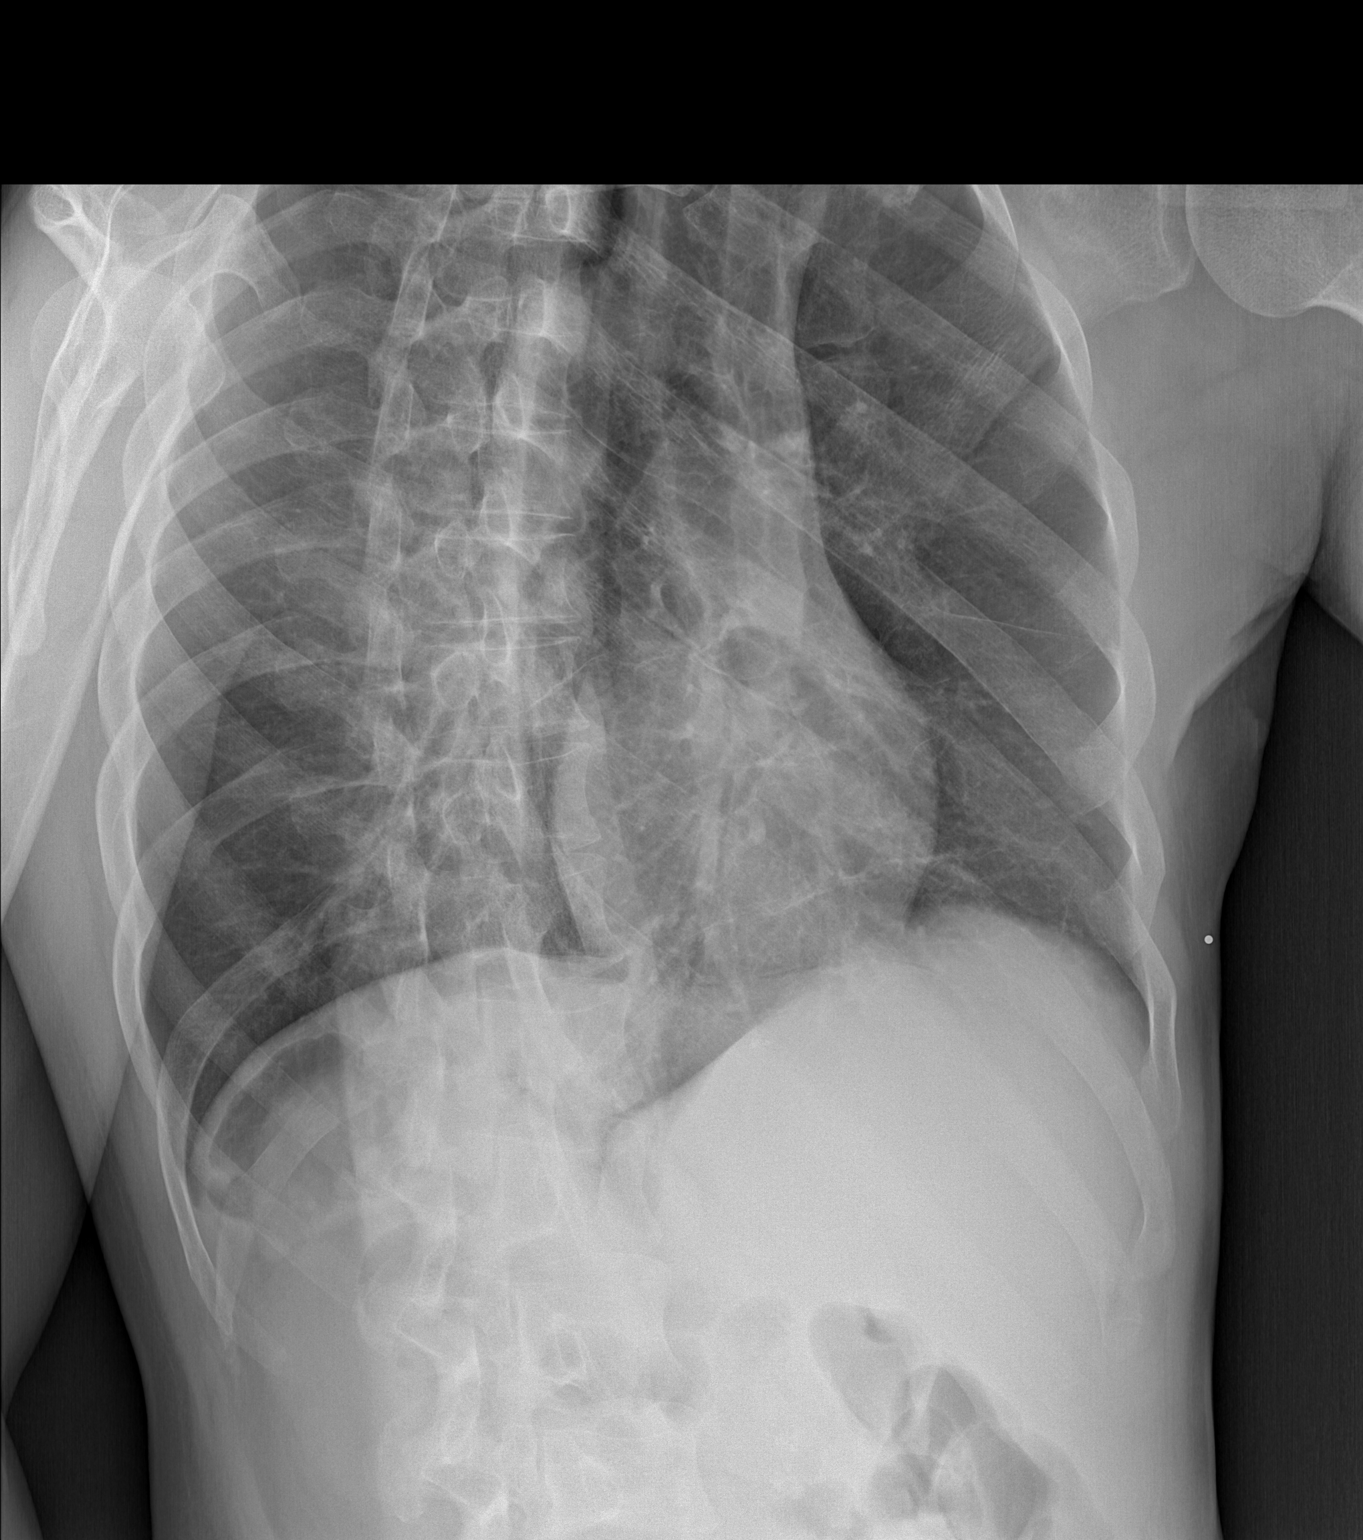

[2 of 2 positions shown; findings below may reference images not displayed]

FINDINGS: Dedicated imaging of the right ribs demonstrates no acute or healing
fracture. There is no pneumothorax. Mild atelectasis or scarring is
present at the right base.
IMPRESSION: No acute fracture or pneumothorax.

## 2021-01-14 ENCOUNTER — Emergency Department (HOSPITAL_COMMUNITY)
Admission: EM | Admit: 2021-01-14 | Discharge: 2021-01-14 | Disposition: A | Discharge status: Home / Self Care | Payer: 59 | Attending: Emergency Medicine | Admitting: Emergency Medicine

## 2021-01-14 ENCOUNTER — Other Ambulatory Visit: Payer: Self-pay

## 2021-01-14 DIAGNOSIS — R1013 Epigastric pain: Secondary | ICD-10-CM | POA: Diagnosis not present

## 2021-01-14 DIAGNOSIS — Z7951 Long term (current) use of inhaled steroids: Secondary | ICD-10-CM | POA: Insufficient documentation

## 2021-01-14 DIAGNOSIS — F17210 Nicotine dependence, cigarettes, uncomplicated: Secondary | ICD-10-CM | POA: Diagnosis not present

## 2021-01-14 DIAGNOSIS — J45909 Unspecified asthma, uncomplicated: Secondary | ICD-10-CM | POA: Diagnosis not present

## 2021-01-14 DIAGNOSIS — R079 Chest pain, unspecified: Secondary | ICD-10-CM | POA: Insufficient documentation

## 2021-01-14 DIAGNOSIS — R110 Nausea: Secondary | ICD-10-CM | POA: Diagnosis not present

## 2021-01-14 DIAGNOSIS — Z91040 Latex allergy status: Secondary | ICD-10-CM | POA: Insufficient documentation

## 2021-01-14 DIAGNOSIS — I10 Essential (primary) hypertension: Secondary | ICD-10-CM | POA: Diagnosis not present

## 2021-01-14 DIAGNOSIS — Z79899 Other long term (current) drug therapy: Secondary | ICD-10-CM | POA: Diagnosis not present

## 2021-01-14 LAB — COMPREHENSIVE METABOLIC PANEL
ALT: 56 U/L — ABNORMAL HIGH (ref 0–44)
AST: 77 U/L — ABNORMAL HIGH (ref 15–41)
Albumin: 4 g/dL (ref 3.5–5.0)
Alkaline Phosphatase: 85 U/L (ref 38–126)
Anion gap: 14 (ref 5–15)
BUN: 15 mg/dL (ref 6–20)
CO2: 11 mmol/L — ABNORMAL LOW (ref 22–32)
Calcium: 8.7 mg/dL — ABNORMAL LOW (ref 8.9–10.3)
Chloride: 108 mmol/L (ref 98–111)
Creatinine, Ser: 1.05 mg/dL (ref 0.61–1.24)
GFR, Estimated: >60 mL/min (ref >60)
Glucose, Bld: 90 mg/dL (ref 70–99)
Potassium: 3.4 mmol/L — ABNORMAL LOW (ref 3.5–5.1)
Sodium: 133 mmol/L — ABNORMAL LOW (ref 135–145)
Total Bilirubin: 0.6 mg/dL (ref 0.3–1.2)
Total Protein: 7.8 g/dL (ref 6.5–8.1)

## 2021-01-14 LAB — CBC WITH DIFFERENTIAL/PLATELET
Abs Immature Granulocytes: 0.03 10*3/uL (ref 0.00–0.07)
Basophils Absolute: 0 10*3/uL (ref 0.0–0.1)
Basophils Relative: 0 %
Eosinophils Absolute: 0.1 10*3/uL (ref 0.0–0.5)
Eosinophils Relative: 2 %
HCT: 47.4 % (ref 39.0–52.0)
Hemoglobin: 14.4 g/dL (ref 13.0–17.0)
Immature Granulocytes: 1 %
Lymphocytes Relative: 36 %
Lymphs Abs: 2.3 10*3/uL (ref 0.7–4.0)
MCH: 29.9 pg (ref 26.0–34.0)
MCHC: 30.4 g/dL (ref 30.0–36.0)
MCV: 98.5 fL (ref 80.0–100.0)
Monocytes Absolute: 0.6 10*3/uL (ref 0.1–1.0)
Monocytes Relative: 9 %
Neutro Abs: 3.4 10*3/uL (ref 1.7–7.7)
Neutrophils Relative %: 52 %
Platelets: 189 10*3/uL (ref 150–400)
RBC: 4.81 MIL/uL (ref 4.22–5.81)
RDW: 16 % — ABNORMAL HIGH (ref 11.5–15.5)
WBC: 6.4 10*3/uL (ref 4.0–10.5)
nRBC: 0 % (ref 0.0–0.2)

## 2021-01-14 LAB — ETHANOL: Alcohol, Ethyl (B): <10 mg/dL (ref <10)

## 2021-01-14 LAB — LIPASE, BLOOD: Lipase: 26 U/L (ref 11–51)

## 2021-01-14 MED ORDER — ALUM & MAG HYDROXIDE-SIMETH 200-200-20 MG/5ML PO SUSP
30.0000 mL | Freq: Once | ORAL | Status: AC
  Administered 2021-01-14: 30 mL via ORAL
  Filled 2021-01-14: qty 30

## 2021-01-14 MED ORDER — SODIUM CHLORIDE 0.9 % IV BOLUS
1000.0000 mL | Freq: Once | INTRAVENOUS | Status: AC
  Administered 2021-01-14: 1000 mL via INTRAVENOUS

## 2021-01-14 MED ORDER — HYDROMORPHONE HCL 1 MG/ML IJ SOLN
1.0000 mg | Freq: Once | INTRAMUSCULAR | Status: AC
  Administered 2021-01-14: 1 mg via INTRAVENOUS
  Filled 2021-01-14: qty 1

## 2021-01-14 MED ORDER — ONDANSETRON HCL 4 MG/2ML IJ SOLN
4.0000 mg | Freq: Once | INTRAMUSCULAR | Status: AC
  Administered 2021-01-14: 4 mg via INTRAVENOUS
  Filled 2021-01-14: qty 2

## 2021-01-14 NOTE — Discharge Instructions (Addendum)
As discussed, your evaluation today has been largely reassuring.  But, it is important that you monitor your condition carefully, and do not hesitate to return to the ED if you develop new, or concerning changes in your condition. ? ?Otherwise, please follow-up with your physician for appropriate ongoing care. ? ?

## 2021-01-14 NOTE — ED Provider Notes (Signed)
[facility] EMERGENCY DEPARTMENT Provider Note   CSN: [id number] Arrival date & time: 01/14/21  1614     History Chief Complaint  Patient presents with   Chest Pain    [name] is a 50 y.o. male.  HPI Patient with multiple medical issues including alcohol abuse, cocaine abuse, pancreatitis presents with epigastric and chest pain.  Seemingly patient was okay until today.  He notes last alcohol intake was yesterday, drinking 1 beer.  Today, without obvious precipitant, about 10 hours ago he developed epigastric pain with radiation to his sternum.  He is associated nausea.  He has had episodes of hematemesis, and possibly black stool as well.  No relief with anything.  No syncope, no other chest pain.    Past Medical History:  Diagnosis Date   Alcoholism /alcohol abuse    Anemia    Anxiety    Arthritis    "knees; arms; elbows" (03/26/2015)   Asthma    Bipolar disorder (HCC)    Chronic bronchitis (HCC)    Chronic lower back pain    Chronic pancreatitis ([place])    Cocaine abuse ([place])    Depression    Family history of adverse reaction to anesthesia    "grandmother gets confused"   Femoral condyle fracture ([place]) 03/08/2014   left medial/notes 03/09/2014   GERD (gastroesophageal reflux disease)    H/O hiatal hernia    H/O suicide attempt 10/2012   High cholesterol    History of blood transfusion 10/2012   "when I tried to commit suicide"   History of stomach ulcers    Hypertension    Marijuana abuse, continuous    Migraine    "a few times/year" (03/26/2015)   Pneumonia 1990's X 3   PTSD (post-traumatic stress disorder)    Sickle cell trait ([place])    WPW (Wolff-Parkinson-White syndrome)    [name] 03/06/2013    Patient Active Problem List   Diagnosis Date Noted   Urinary retention    Protein-calorie malnutrition, severe 11/17/2020   Acute pancreatitis 11/15/2020   Seizure ([place]) 11/13/2018   Acute on chronic pancreatitis ([place]) 09/28/2017    Normocytic anemia 12/05/2016   Alcohol use disorder, severe, dependence ([place]) 07/25/2016   Cocaine use disorder, severe, dependence ([place]) 07/25/2016   Major depressive disorder, 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/20/2016   Chronic pancreatitis ([place]) 05/18/2015   Essential hypertension 02/06/2014   Mood disorder ([place]) 02/06/2014   Pancreatic pseudocyst/cyst 11/25/2013   [name] Parkinson White pattern seen on electrocardiogram 10/03/2012   TOBACCO ABUSE 03/23/2007    Past Surgical History:  Procedure Laterality Date   BIOPSY  11/25/2017   Procedure: BIOPSY;  Surgeon: Arta Silence, MD;  Location: [place];  Service: Endoscopy;;   BIOPSY  10/14/2018   Procedure: BIOPSY;  Surgeon: Arta Silence, MD;  Location: [place];  Service: Endoscopy;;   CARDIAC CATHETERIZATION     CYST ENTEROSTOMY  01/02/2020   Procedure: CYST ASPIRATION;  Surgeon: Milus Banister, MD;  Location: [facility] ENDOSCOPY;  Service: Endoscopy;;   ESOPHAGOGASTRODUODENOSCOPY (EGD) WITH PROPOFOL N/A 11/25/2017   Procedure: ESOPHAGOGASTRODUODENOSCOPY (EGD) WITH PROPOFOL;  Surgeon: Arta Silence, MD;  Location: [facility] ENDOSCOPY;  Service: Endoscopy;  Laterality: N/A;   ESOPHAGOGASTRODUODENOSCOPY (EGD) WITH PROPOFOL Left 10/14/2018   Procedure: ESOPHAGOGASTRODUODENOSCOPY (EGD) WITH PROPOFOL;  Surgeon: Arta Silence, MD;  [facility] END[facility]COPY;  Service: Endoscopy;  Laterality: Left;   ESOPHAGOGASTRODUODENOSCOPY (EGD) WITH PROPOFOL N/A 11/14/2018   Procedure: ESOPHAGOGASTRODUODENOSCOPY (EGD) WITH PROPOFOL;  Surgeon: Laurence Spates,  MD;  Location: [facility] ENDOSCOPY;  Service: Gastroenterology;  Laterality: N/A;   ESOPHAGOGASTRODUODENOSCOPY (EGD) WITH PROPOFOL N/A 01/02/2020   Procedure: ESOPHAGOGASTRODUODENOSCOPY (EGD) WITH PROPOFOL;  Surgeon: Milus Banister, MD;  Location: [facility] ENDOSCOPY;  Service: Endoscopy;  Laterality: N/A;   ESOPHAGOGASTRODUODENOSCOPY (EGD) WITH PROPOFOL N/A 10/25/2020   Procedure:  ESOPHAGOGASTRODUODENOSCOPY (EGD) WITH PROPOFOL;  Surgeon: Rush Landmark Telford Nab., MD;  Location: [place];  Service: Gastroenterology;  Laterality: N/A;   EUS N/A 01/02/2020   Procedure: UPPER ENDOSCOPIC ULTRASOUND (EUS) RADIAL;  Surgeon: Milus Banister, MD;  Location: [facility] ENDOSCOPY;  Service: Endoscopy;  Laterality: N/A;   EYE SURGERY Left 1990's   "result of trauma"    FACIAL FRACTURE SURGERY Left 1990's   "result of trauma"    FLEXIBLE SIGMOIDOSCOPY N/A 10/25/2020   Procedure: FLEXIBLE SIGMOIDOSCOPY;  Surgeon: Irving Copas., MD;  Location: [place];  Service: Gastroenterology;  Laterality: N/A;   FRACTURE SURGERY     HEMOSTASIS CLIP PLACEMENT  10/25/2020   Procedure: HEMOSTASIS CLIP PLACEMENT;  Surgeon: Irving Copas., MD;  Location: [place];  Service: Gastroenterology;;   HERNIA REPAIR     LEFT HEART CATHETERIZATION WITH CORONARY ANGIOGRAM Right 03/07/2013   Procedure: LEFT HEART CATHETERIZATION WITH CORONARY ANGIOGRAM;  Surgeon: [name], MD;  Location: [place] CATH LAB;  Service: Cardiovascular;  Laterality: Right;   UMBILICAL HERNIA REPAIR     UPPER GASTROINTESTINAL ENDOSCOPY         Family History  Problem Relation Age of Onset   Hypertension Mother    Cirrhosis Father    Alcoholism Father    Hypertension Father    Melanoma Father    Hypertension Other    Coronary artery disease Other     Social History   Tobacco Use   Smoking status: Every Day    Packs/day: 1.00    Years: 36.00    Pack years: 36.00    Types: Cigarettes, E-cigarettes   Smokeless tobacco: Never  Vaping Use   Vaping Use: Former  Substance Use Topics   Alcohol use: Yes    Comment: last drink 8 days ago   Drug use: Yes    Types: Marijuana, Cocaine    Comment: daily marijuana use; last cocaine use about 3 months ago    Home Medications Prior to Admission medications   Medication Sig Start Date End Date Taking? Authorizing Provider  acetaminophen (TYLENOL) 500 MG  tablet Take 500 mg by mouth every 6 (six) hours as needed for moderate pain or headache.    [provider]  albuterol (VENTOLIN HFA) 108 (90 Base) MCG/ACT inhaler Inhale 2 puffs into the lungs every 4 (four) hours as needed for wheezing or shortness of breath. 11/12/19   [name], DO  cephALEXin (KEFLEX) 500 MG capsule Take 1 capsule (500 mg total) by mouth 2 (two) times daily. 12/01/20   Malvin Johns, MD  cholecalciferol (VITAMIN D) 25 MCG tablet Take 1 tablet (1,000 Units total) by mouth daily. 10/30/20   [name], MD  cyclobenzaprine (FLEXERIL) 10 MG tablet Take 10 mg by mouth 3 (three) times daily as needed for muscle spasms. 11/03/20   [provider]  fluticasone furoate-vilanterol (BREO ELLIPTA) 200-25 MCG/INH AEPB Inhale 1 puff into the lungs daily. Patient taking differently: Inhale 1 puff into the lungs daily as needed (asthma). 11/12/19   [name], DO  folic acid (FOLVITE) 1 MG tablet Take 1 tablet (1 mg total) by mouth daily. 11/26/17   [name], MD  lidocaine (XYLOCAINE) 2 % solution Use as directed 15 mLs in the mouth or throat every 6 (six) hours as needed for mouth pain. 11/21/20   [name], [name], DO  lipase/protease/amylase (CREON) 36000 UNITS CPEP capsule Take 2 capsules (72,000 Units total) by mouth 3 (three) times daily with meals. For pancreatitis 11/05/19   [name], [name], MD  magnesium oxide (MAG-OX) 400 MG tablet Take 400 mg by mouth daily.    [provider]  metoprolol (TOPROL-XL) 200 MG 24 hr tablet Take 200 mg by mouth daily. 10/29/20   [provider]  Multiple Vitamin (MULTIVITAMIN WITH MINERALS) TABS tablet Take 1 tablet by mouth daily. 09/06/17   Bonnell Public, MD  omeprazole (PRILOSEC) 20 MG capsule Take 20 mg by mouth daily. Patient not taking: No sig reported    [provider]  ondansetron (ZOFRAN-ODT) 4 MG disintegrating tablet Take 4 mg by mouth 2 (two) times daily as needed for vomiting or  nausea. 10/20/20   [provider]  OVER THE COUNTER MEDICATION Take 1 tablet by mouth daily. Iron supplement.    [provider]  oxyCODONE-acetaminophen (PERCOCET/ROXICET) 5-325 MG tablet Take 1 tablet by mouth every 6 (six) hours as needed for up to 10 doses for severe pain. 12/02/20   Luna Fuse, MD  pantoprazole (PROTONIX) 40 MG tablet Take 1 tablet (40 mg total) by mouth daily. 11/21/20 12/21/20  [name], [name], DO  potassium chloride (KLOR-CON) 10 MEQ tablet Take 10 mEq by mouth daily.    [provider]  QUEtiapine (SEROQUEL) 50 MG tablet Take 50 mg by mouth at bedtime. Patient not taking: Reported on 12/02/2020    [provider]  sertraline (ZOLOFT) 100 MG tablet Take 1 tablet (100 mg total) by mouth daily. 03/27/20   [name], MD  sucralfate (CARAFATE) 1 g tablet Take 1 tablet (1 g total) by mouth 4 (four) times daily -  with meals and at bedtime. 05/23/20   Kerin Perna, NP  thiamine 100 MG tablet Take 1 tablet (100 mg total) by mouth daily. 10/16/18   [name], MD  verapamil (CALAN) 120 MG tablet Take 120 mg by mouth daily.    [provider]  Vitamin D, Ergocalciferol, (DRISDOL) 1.25 MG (50000 UNIT) CAPS capsule Take 50,000 Units by mouth every 7 (seven) days.    [provider]  amitriptyline (ELAVIL) 25 MG tablet Take 1 tablet (25 mg total) by mouth at bedtime. Patient not taking: Reported on 08/08/2019 10/15/18 08/08/19  [name], MD  gabapentin (NEURONTIN) 100 MG capsule Take 1 capsule (100 mg total) by mouth 2 (two) times daily. Patient not taking: Reported on 08/08/2019 10/15/18 08/08/19  [name], MD  promethazine (PHENERGAN) 25 MG tablet Take 1 tablet (25 mg total) by mouth every 6 (six) hours as needed for nausea or vomiting. Patient not taking: Reported on 08/08/2019 03/02/19 08/08/19  Kinnie Feil, PA-C    Allergies    Robaxin [methocarbamol], Aspirin, Shellfish-derived products, Trazodone and  nefazodone, Adhesive [tape], Latex, Toradol [ketorolac tromethamine], Contrast media [iodinated diagnostic agents], and Reglan [metoclopramide]  Review of Systems   Review of Systems  Constitutional:        Per HPI, otherwise negative  HENT:         Per HPI, otherwise negative  Respiratory:         Per HPI, otherwise negative  Cardiovascular:        Per HPI, otherwise negative  Gastrointestinal:  Positive for abdominal pain, nausea and vomiting.  Endocrine:       Negative aside from HPI  Genitourinary:        Neg aside from HPI   Musculoskeletal:        Per HPI, otherwise negative  Skin: Negative.   Neurological:  Negative for syncope.   Physical Exam Updated Vital Signs BP (!) 146/108 (BP Location: Right Arm)   Pulse 80   Temp 98.6 F (37 C) (Oral)   Resp 18   SpO2 100%   Physical Exam Vitals and nursing note reviewed.  Constitutional:      General: He is not in acute distress.    Appearance: He is well-developed.  HENT:     Head: Normocephalic and atraumatic.  Eyes:     Conjunctiva/sclera: Conjunctivae normal.  Cardiovascular:     Rate and Rhythm: Normal rate and regular rhythm.     Pulses:          Radial pulses are 2+ on the right side.  Pulmonary:     Effort: Pulmonary effort is normal. No respiratory distress.     Breath sounds: No stridor.  Chest:    Abdominal:     General: There is no distension.     Tenderness: There is abdominal tenderness in the epigastric area.  Skin:    General: Skin is warm and dry.  Neurological:     Mental Status: He is alert and oriented to person, place, and time.    ED Results / Procedures / Treatments   Labs (all labs ordered are listed, but only abnormal results are displayed) Labs Reviewed  COMPREHENSIVE METABOLIC PANEL  ETHANOL  LIPASE, BLOOD  CBC WITH DIFFERENTIAL/PLATELET   EMS rhythm strip sinus rhythm, rate 77, T wave abnormalities throughout, abnormal EKG. EKG EKG Interpretation  Date/Time:  Thursday  January 14 2021 16:30:28 EDT Ventricular Rate:  78 PR Interval:  111 QRS Duration: 80 QT Interval:  376 QTC Calculation: 429 R Axis:   71 Text Interpretation: Sinus rhythm Borderline short PR interval Biatrial enlargement T wave abnormality No significant change since last tracing Abnormal ECG Confirmed by Carmin Muskrat [phone]) on 01/14/2021 4:43:46 PM  Radiology No results found.  Procedures Procedures   Medications Ordered in ED Medications  sodium chloride 0.9 % bolus 1,000 mL (has no administration in time range)  HYDROmorphone (DILAUDID) injection 1 mg (has no administration in time range)  ondansetron (ZOFRAN) injection 4 mg (has no administration in time range)    ED Course  I have reviewed the triage vital signs and the nursing notes.  Pertinent labs & imaging results that were available during my care of the patient were reviewed by me and considered in my medical decision making (see chart for details).  Cardiac monitor 75 sinus normal Pulse ox 100% room air normal  7:06 PM Patient in no distress, awake, alert, continues to complain of epigastric pain, but no other. Have reviewed his labs, discussed them with him.  Labs generally unremarkable, with no substantial LFT elevation, no elevated lipase, no leukocytosis.  Patient's history of chronic pancreatitis likely contributing to his pain episode.  EKG is on remarkable, and the pain is described as epigastric, consistent with prior.  Low suspicion for atypical ACS.  No evidence for acute new phenomena, he has nonperitoneal exam, is afebrile, awake, alert, sitting upright.  We discussed the importance of following up with his GI team, patient discharged in stable condition  MDM Rules/Calculators/A&P MDM Number of  Diagnoses or Management Options Epigastric pain: established, worsening   Amount and/or Complexity of Data Reviewed Clinical lab tests: ordered and reviewed Tests in the radiology section of CPT: ordered  and reviewed Tests in the medicine section of CPT: reviewed and ordered Decide to obtain previous medical records or to obtain history from someone other than the patient: yes Review and summarize past medical records: yes Independent visualization of images, tracings, or specimens: yes  Risk of Complications, Morbidity, and/or Mortality Presenting problems: high Diagnostic procedures: high Management options: high  Critical Care Total time providing critical care: < 30 minutes  Patient Progress Patient progress: improved   Final Clinical Impression(s) / ED Diagnoses Final diagnoses:  Epigastric pain      Carmin Muskrat, MD 01/14/21 1910

## 2021-01-14 NOTE — ED Notes (Signed)
Care assumed of this patient by this RN at this time

## 2021-01-14 NOTE — ED Triage Notes (Signed)
BIB EMS from home c/o chest pain and nausea. EMS gave 6 mg of morphine and 4 mg of Zofran. His pain has improved some.

## 2021-02-22 ENCOUNTER — Emergency Department (HOSPITAL_COMMUNITY)
Admission: EM | Admit: 2021-02-22 | Discharge: 2021-02-22 | Disposition: A | Discharge status: Home / Self Care | Payer: 59 | Attending: Emergency Medicine | Admitting: Emergency Medicine

## 2021-02-22 ENCOUNTER — Other Ambulatory Visit: Payer: Self-pay

## 2021-02-22 DIAGNOSIS — R079 Chest pain, unspecified: Secondary | ICD-10-CM | POA: Diagnosis present

## 2021-02-22 DIAGNOSIS — F17210 Nicotine dependence, cigarettes, uncomplicated: Secondary | ICD-10-CM | POA: Insufficient documentation

## 2021-02-22 DIAGNOSIS — K860 Alcohol-induced chronic pancreatitis: Secondary | ICD-10-CM | POA: Insufficient documentation

## 2021-02-22 DIAGNOSIS — Z91040 Latex allergy status: Secondary | ICD-10-CM | POA: Diagnosis not present

## 2021-02-22 DIAGNOSIS — K259 Gastric ulcer, unspecified as acute or chronic, without hemorrhage or perforation: Secondary | ICD-10-CM | POA: Diagnosis not present

## 2021-02-22 DIAGNOSIS — Z79899 Other long term (current) drug therapy: Secondary | ICD-10-CM | POA: Insufficient documentation

## 2021-02-22 DIAGNOSIS — I10 Essential (primary) hypertension: Secondary | ICD-10-CM | POA: Diagnosis not present

## 2021-02-22 DIAGNOSIS — J45909 Unspecified asthma, uncomplicated: Secondary | ICD-10-CM | POA: Diagnosis not present

## 2021-02-22 MED ORDER — ONDANSETRON 4 MG PO TBDP
4.0000 mg | ORAL_TABLET | Freq: Once | ORAL | Status: AC
  Administered 2021-02-22: 4 mg via ORAL
  Filled 2021-02-22: qty 1

## 2021-02-22 MED ORDER — LIDOCAINE VISCOUS HCL 2 % MT SOLN
15.0000 mL | Freq: Once | OROMUCOSAL | Status: AC
  Administered 2021-02-22: 15 mL via ORAL
  Filled 2021-02-22: qty 15

## 2021-02-22 MED ORDER — ALUM & MAG HYDROXIDE-SIMETH 200-200-20 MG/5ML PO SUSP
30.0000 mL | Freq: Once | ORAL | Status: AC
  Administered 2021-02-22: 30 mL via ORAL
  Filled 2021-02-22: qty 30

## 2021-02-22 MED ORDER — MORPHINE SULFATE (PF) 2 MG/ML IV SOLN
2.0000 mg | Freq: Once | INTRAVENOUS | Status: AC
Start: 2021-02-22 — End: 2021-02-22
  Administered 2021-02-22: 2 mg via INTRAVENOUS
  Filled 2021-02-22: qty 1

## 2021-02-22 MED ORDER — HYDROCODONE-ACETAMINOPHEN 5-325 MG PO TABS
2.0000 | ORAL_TABLET | Freq: Once | ORAL | Status: AC
  Administered 2021-02-22: 2 via ORAL
  Filled 2021-02-22: qty 2

## 2021-02-22 NOTE — ED Triage Notes (Addendum)
Pt here via EMS from home d/t sudden onset of left side chest pain. Radiating to neck and shoulder. Sharp and constant. 8/10 Denies N/V, SOB EMS gave 133mcg fentanyl with some relief.   188/122 HR 95 100% RA 18 RR

## 2021-02-22 NOTE — ED Notes (Signed)
Pt teaching provided on medications that may cause drowsiness. Pt instructed not to drive or operate heavy machinery while taking the prescribed medication. Pt verbalized understanding.  ? ?Pt provided discharge instructions and prescription information. Pt was given the opportunity to ask questions and questions were answered. Discharge signature not obtained in the setting of the COVID-19 pandemic in order to reduce high touch surfaces.  ? ?

## 2021-02-22 NOTE — ED Provider Notes (Addendum)
[facility] Provider Note   CSN: [id number] Arrival date & time: 02/22/21  1051     History Chief Complaint  Patient presents with   Chest Pain    [name] is a 50 y.o. male with a past medical history significant for significant alcohol abuse, chronic pancreatitis with known pseudocyst, recurrent emergency visits who presents initially from EMS with left-sided chest pain, on my evaluation patient denies chest pain, does report ongoing pancreatitis related pain, that does radiate into the chest, and into the back.  Patient reports that he was drinking heavily last Thursday, reports he drank about 48 ounces of beer, patient also reports that he ate a fatty meal last night prior to the pain developing.  Patient reports that he has had difficulty eating, with some nausea today.  Patient reports compliance with Creon, omeprazole, sucralfate.  Patient does endorse some stools without blood in stool, denies dysuria, denies vomiting.  Patient reports that he normally takes oxycodone for chronic pain, however he is currently out of his pain medications, has an appointment to see his pain management doctor on Wednesday.   Chest Pain Associated symptoms: abdominal pain and nausea   Associated symptoms: no vomiting       Past Medical History:  Diagnosis Date   Alcoholism /alcohol abuse    Anemia    Anxiety    Arthritis    "knees; arms; elbows" (03/26/2015)   Asthma    Bipolar disorder (HCC)    Chronic bronchitis (HCC)    Chronic lower back pain    Chronic pancreatitis ([place])    Cocaine abuse ([place])    Depression    Family history of adverse reaction to anesthesia    "grandmother gets confused"   Femoral condyle fracture ([place]) 03/08/2014   left medial/notes 03/09/2014   GERD (gastroesophageal reflux disease)    H/O hiatal hernia    H/O suicide attempt 10/2012   High cholesterol    History of blood transfusion 10/2012   "when I tried  to commit suicide"   History of stomach ulcers    Hypertension    Marijuana abuse, continuous    Migraine    "a few times/year" (03/26/2015)   Pneumonia 1990's X 3   PTSD (post-traumatic stress disorder)    Sickle cell trait ([place])    WPW (Wolff-Parkinson-White syndrome)    [name] 03/06/2013    Patient Active Problem List   Diagnosis Date Noted   Urinary retention    Protein-calorie malnutrition, severe 11/17/2020   Acute pancreatitis 11/15/2020   Seizure ([place]) 11/13/2018   Acute on chronic pancreatitis ([place]) 09/28/2017   Normocytic anemia 12/05/2016   Alcohol use disorder, severe, dependence ([place]) 07/25/2016   Cocaine use disorder, severe, dependence ([place]) 07/25/2016   Major depressive disorder, recurrent severe without psychotic features ([place]) 07/20/2016   Chronic pancreatitis ([place]) 05/18/2015   Essential hypertension 02/06/2014   Mood disorder ([place]) 02/06/2014   Pancreatic pseudocyst/cyst 11/25/2013   [name] Parkinson White pattern seen on electrocardiogram 10/03/2012   TOBACCO ABUSE 03/23/2007    Past Surgical History:  Procedure Laterality Date   BIOPSY  11/25/2017   Procedure: BIOPSY;  Surgeon: Arta Silence, MD;  Location: [facility];  Service: Endoscopy;;   BIOPSY  10/14/2018   Procedure: BIOPSY;  Surgeon: Arta Silence, MD;  Location: [facility];  Service: Endoscopy;;   CARDIAC CATHETERIZATION     CYST ENTEROSTOMY  01/02/2020   Procedure: CYST ASPIRATION;  Surgeon: Milus Banister, MD;  Location:  [facility];  Service: Endoscopy;;   ESOPHAGOGASTRODUODENOSCOPY (EGD) WITH PROPOFOL N/A 11/25/2017   Procedure: ESOPHAGOGASTRODUODENOSCOPY (EGD) WITH PROPOFOL;  Surgeon: Arta Silence, MD;  Location: [facility];  Service: Endoscopy;  Laterality: N/A;   ESOPHAGOGASTRODUODENOSCOPY (EGD) WITH PROPOFOL Left 10/14/2018   Procedure: ESOPHAGOGASTRODUODENOSCOPY (EGD) WITH PROPOFOL;  Surgeon: Arta Silence, MD;  [facility] ENDOSCOPY;  Service: Endoscopy;  Laterality:  Left;   ESOPHAGOGASTRODUODENOSCOPY (EGD) WITH PROPOFOL N/A 11/14/2018   Procedure: ESOPHAGOGASTRODUODENOSCOPY (EGD) WITH PROPOFOL;  Surgeon: Laurence Spates, MD;  Location: [facility];  Service: Gastroenterology;  Laterality: N/A;   ESOPHAGOGASTRODUODENOSCOPY (EGD) WITH PROPOFOL N/A 01/02/2020   Procedure: ESOPHAGOGASTRODUODENOSCOPY (EGD) WITH PROPOFOL;  Surgeon: Milus Banister, MD;  Location: [facility];  Service: Endoscopy;  Laterality: N/A;   ESOPHAGOGASTRODUODENOSCOPY (EGD) WITH PROPOFOL N/A 10/25/2020   Procedure: ESOPHAGOGASTRODUODENOSCOPY (EGD) WITH PROPOFOL;  Surgeon: Rush Landmark Telford Nab., MD;  Location: [facility];  Service: Gastroenterology;  Laterality: N/A;   EUS N/A 01/02/2020   Procedure: UPPER ENDOSCOPIC ULTRASOUND (EUS) RADIAL;  Surgeon: Milus Banister, MD;  Location: [facility];  Service: Endoscopy;  Laterality: N/A;   EYE SURGERY Left 1990's   "result of trauma"    FACIAL FRACTURE SURGERY Left 1990's   "result of trauma"    FLEXIBLE SIGMOIDOSCOPY N/A 10/25/2020   Procedure: FLEXIBLE SIGMOIDOSCOPY;  Surgeon: Irving Copas., MD;  Location: [facility];  Service: Gastroenterology;  Laterality: N/A;   FRACTURE SURGERY     HEMOSTASIS CLIP PLACEMENT  10/25/2020   Procedure: HEMOSTASIS CLIP PLACEMENT;  Surgeon: Irving Copas., MD;  Location: [facility];  Service: Gastroenterology;;   HERNIA REPAIR     LEFT HEART CATHETERIZATION WITH CORONARY ANGIOGRAM Right 03/07/2013   Procedure: LEFT HEART CATHETERIZATION WITH CORONARY ANGIOGRAM;  Surgeon: [name], MD;  Location: [facility];  Service: Cardiovascular;  Laterality: Right;   UMBILICAL HERNIA REPAIR     UPPER GASTROINTESTINAL ENDOSCOPY         Family History  Problem Relation Age of Onset   Hypertension Mother    Cirrhosis Father    Alcoholism Father    Hypertension Father    Melanoma Father    Hypertension Other    Coronary artery disease Other     Social History   Tobacco Use    Smoking status: Every Day    Packs/day: 1.00    Years: 36.00    Pack years: 36.00    Types: Cigarettes, E-cigarettes   Smokeless tobacco: Never  Vaping Use   Vaping Use: Former  Substance Use Topics   Alcohol use: Yes    Comment: last drink 8 days ago   Drug use: Yes    Types: Marijuana, Cocaine    Comment: daily marijuana use; last cocaine use about 3 months ago    Home Medications Prior to Admission medications   Medication Sig Start Date End Date Taking? Authorizing Provider  acetaminophen (TYLENOL) 500 MG tablet Take 500 mg by mouth every 6 (six) hours as needed for moderate pain or headache.   Yes [provider]  albuterol (VENTOLIN HFA) 108 (90 Base) MCG/ACT inhaler Inhale 2 puffs into the lungs every 4 (four) hours as needed for wheezing or shortness of breath. 11/12/19  Yes [name], DO  cholecalciferol (VITAMIN D) 25 MCG tablet Take 1 tablet (1,000 Units total) by mouth daily. 10/30/20  Yes [name], MD  cyclobenzaprine (FLEXERIL) 10 MG tablet Take 10 mg by mouth 3 (three) times daily as needed for muscle spasms. 11/03/20  Yes [provider]  fluticasone furoate-vilanterol (BREO ELLIPTA) 200-25 MCG/INH AEPB Inhale 1 puff into the lungs daily. Patient taking differently: Inhale 1 puff into the lungs daily as needed (asthma). 11/12/19  Yes [name], DO  folic acid (FOLVITE) 1 MG tablet Take 1 tablet (1 mg total) by mouth daily. 11/26/17  Yes [name], MD  lidocaine (XYLOCAINE) 2 % solution Use as directed 15 mLs in the mouth or throat every 6 (six) hours as needed for mouth pain. 11/21/20  Yes [name], [name], DO  lipase/protease/amylase (CREON) 36000 UNITS CPEP capsule Take 2 capsules (72,000 Units total) by mouth 3 (three) times daily with meals. For pancreatitis 11/05/19  Yes [name], [name], MD  magnesium oxide (MAG-OX) 400 (240 Mg) MG tablet Take 1 tablet by mouth daily. 12/24/20  Yes [provider]  metoprolol (TOPROL-XL)  200 MG 24 hr tablet Take 200 mg by mouth daily. 10/29/20  Yes [provider]  Multiple Vitamin (MULTIVITAMIN WITH MINERALS) TABS tablet Take 1 tablet by mouth daily. 09/06/17  Yes Dana Allan I, MD  omeprazole (PRILOSEC) 40 MG capsule Take 40 mg by mouth daily. 02/16/21  Yes [provider]  ondansetron (ZOFRAN-ODT) 4 MG disintegrating tablet Take 4 mg by mouth 2 (two) times daily as needed for vomiting or nausea. 10/20/20  Yes [provider]  OVER THE COUNTER MEDICATION Take 1 tablet by mouth daily. Iron supplement.   Yes [provider]  oxyCODONE-acetaminophen (PERCOCET/ROXICET) 5-325 MG tablet Take 1 tablet by mouth every 6 (six) hours as needed for up to 10 doses for severe pain. 12/02/20  Yes Hong, Greggory Brandy, MD  potassium chloride (KLOR-CON) 10 MEQ tablet Take 10 mEq by mouth daily as needed (low potassium).   Yes [provider]  QUEtiapine (SEROQUEL) 50 MG tablet Take 50 mg by mouth at bedtime.   Yes [provider]  sertraline (ZOLOFT) 100 MG tablet Take 1 tablet (100 mg total) by mouth daily. 03/27/20  Yes [name], MD  sucralfate (CARAFATE) 1 g tablet Take 1 tablet (1 g total) by mouth 4 (four) times daily -  with meals and at bedtime. 05/23/20  Yes Kerin Perna, NP  verapamil (CALAN-SR) 120 MG CR tablet Take 120 mg by mouth at bedtime. 12/24/20  Yes [provider]  Vitamin D, Ergocalciferol, (DRISDOL) 1.25 MG (50000 UNIT) CAPS capsule Take 50,000 Units by mouth every 7 (seven) days.   Yes [provider]  cephALEXin (KEFLEX) 500 MG capsule Take 1 capsule (500 mg total) by mouth 2 (two) times daily. Patient not taking: No sig reported 12/01/20   Malvin Johns, MD  pantoprazole (PROTONIX) 40 MG tablet Take 1 tablet (40 mg total) by mouth daily. 11/21/20 12/21/20  [name], [name], DO  thiamine 100 MG tablet Take 1 tablet (100 mg total) by mouth daily. Patient not taking: No sig reported 10/16/18   [name], MD  amitriptyline (ELAVIL) 25 MG tablet Take 1 tablet (25 mg total) by mouth at bedtime. Patient not taking: Reported on 08/08/2019 10/15/18 08/08/19  [name], MD  gabapentin (NEURONTIN) 100 MG capsule Take 1 capsule (100 mg total) by mouth 2 (two) times daily. Patient not taking: Reported on 08/08/2019 10/15/18 08/08/19  [name], MD  promethazine (PHENERGAN) 25 MG tablet Take 1 tablet (25 mg total) by mouth every 6 (six) hours as needed for nausea or vomiting. Patient not taking: Reported on 08/08/2019 03/02/19 08/08/19  Kinnie Feil, PA-C  Allergies    Robaxin [methocarbamol], Aspirin, Shellfish-derived products, Trazodone and nefazodone, Adhesive [tape], Latex, Toradol [ketorolac tromethamine], Contrast media [iodinated diagnostic agents], and Reglan [metoclopramide]  Review of Systems   Review of Systems  Cardiovascular:  Negative for chest pain.  Gastrointestinal:  Positive for abdominal pain and nausea. Negative for constipation, diarrhea and vomiting.  All other systems reviewed and are negative.  Physical Exam Updated Vital Signs BP (!) 139/98   Pulse 71   Temp 98.3 F (36.8 C) (Oral)   Resp 15   Ht 5\' 8"  (1.727 m)   Wt 54.4 kg   SpO2 100%   BMI 18.25 kg/m   Physical Exam Vitals and nursing note reviewed.  Constitutional:      General: He is not in acute distress.    Appearance: Normal appearance.  HENT:     Head: Normocephalic and atraumatic.  Eyes:     General:        Right eye: No discharge.        Left eye: No discharge.  Cardiovascular:     Rate and Rhythm: Normal rate and regular rhythm.     Heart sounds: No murmur heard.   No friction rub. No gallop.  Pulmonary:     Effort: Pulmonary effort is normal. No respiratory distress.     Breath sounds: Normal breath sounds.  Abdominal:     General: Bowel sounds are normal.     Palpations: Abdomen is soft.     Tenderness: There is abdominal tenderness.     Comments: Tenderness to palpation  throughout entire abdomen, especially tender in the epigastric region, no rebound, rigidity, guarding.  No CVA tenderness.  No suprapubic tenderness.  Musculoskeletal:        General: No tenderness or deformity.     Comments: No swelling or tenderness of calves bilaterally  Skin:    General: Skin is warm and dry.     Capillary Refill: Capillary refill takes less than 2 seconds.  Neurological:     Mental Status: He is alert and oriented to person, place, and time.  Psychiatric:        Mood and Affect: Mood normal.        Behavior: Behavior normal.    ED Results / Procedures / Treatments   Labs (all labs ordered are listed, but only abnormal results are displayed) Labs Reviewed - No data to display  EKG EKG Interpretation  Date/Time:  Monday February 22 2021 10:55:59 EDT Ventricular Rate:  92 PR Interval:  98 QRS Duration: 86 QT Interval:  337 QTC Calculation: 417 R Axis:   77 Text Interpretation: Sinus rhythm Short PR interval Right atrial enlargement Borderline repolarization abnormality since last tracing no significant change Confirmed by Daleen Bo [phone]) on 02/22/2021 11:04:49 AM  Radiology No results found.  Procedures Procedures   Medications Ordered in ED Medications  morphine 2 MG/ML injection 2 mg (has no administration in time range)  alum & mag hydroxide-simeth (MAALOX/MYLANTA) 200-200-20 MG/5ML suspension 30 mL (30 mLs Oral Given 02/22/21 1235)    And  lidocaine (XYLOCAINE) 2 % viscous mouth solution 15 mL (15 mLs Oral Given 02/22/21 1234)  ondansetron (ZOFRAN-ODT) disintegrating tablet 4 mg (4 mg Oral Given 02/22/21 1234)  HYDROcodone-acetaminophen (NORCO/VICODIN) 5-325 MG per tablet 2 tablet (2 tablets Oral Given 02/22/21 1234)    ED Course  I have reviewed the triage vital signs and the nursing notes.  Pertinent labs & imaging results that were available during my care of the  patient were reviewed by me and considered in my medical decision making  (see chart for details).    MDM Rules/Calculators/A&P                         I discussed this case with my attending physician who cosigned this note including patient's presenting symptoms, physical exam, and planned diagnostics and interventions. Attending physician stated agreement with plan or made changes to plan which were implemented.   Care plan on file in regards to ongoing abdominal pain related to his pancreatitis, and chronic pain needs.  Patient presentation is not consistent with an acute abdomen, peritonitis, or rupture of the pancreas at this time.  Based on presentation, previous description of patient's emergency room visits, his presentation is consistent with ongoing pancreatitis, gastric ulcers, and chronic pain.  Patient does request GI cocktail, nausea medicine, pain control.  Discussed no need for further imaging at this time as we have already had a CT confirming pseudocyst, ongoing pancreatitis at this time.  Minimal to no clinical concern for cardiac or pulmonary cause of chest pain, patient does have a clear history that reports radiation of his pain from the abdomen into the chest, with normal-appearing EKG, with no difference compared to other EKGs.  Pain is not exertional in nature, not associated with diaphoresis, vomiting.  Patient with some improvement with Maalox, viscous lidocaine, Norco, Zofran.  Patient is still complaining of pain, however we discussed this is chronic pain, and he has been getting management for his pain clinic.  We will administer 1 more dose of morphine and then discharge patient.  Patient understands and agrees to plan.  Continues to have no signs or symptoms that give me clinical concern for pulmonary embolism, cardiac chest pain, pneumonia or other mediastinal illness. Final Clinical Impression(s) / ED Diagnoses Final diagnoses:  Alcohol-induced chronic pancreatitis [facility])  Multiple gastric ulcers    Rx / DC Orders ED Discharge Orders      None        Anselmo Pickler, PA-C 02/22/21 1410    Anselmo Pickler, PA-C 02/22/21 1411    Daleen Bo, MD 02/22/21 2052

## 2021-02-22 NOTE — Discharge Instructions (Addendum)
Please follow-up with your primary care provider, chronic pain medication provider, and GI doctor.  Significantly worsens, especially if you begin to notice bloody vomit, significant rectal bleeding, please follow-up for further evaluation.

## 2021-02-24 ENCOUNTER — Emergency Department (HOSPITAL_COMMUNITY)
Admission: EM | Admit: 2021-02-24 | Discharge: 2021-02-24 | Disposition: A | Discharge status: Home / Self Care | Payer: 59 | Attending: Emergency Medicine | Admitting: Emergency Medicine

## 2021-02-24 ENCOUNTER — Other Ambulatory Visit: Payer: Self-pay

## 2021-02-24 ENCOUNTER — Encounter (HOSPITAL_COMMUNITY): Payer: Self-pay

## 2021-02-24 DIAGNOSIS — I10 Essential (primary) hypertension: Secondary | ICD-10-CM | POA: Diagnosis not present

## 2021-02-24 DIAGNOSIS — R1012 Left upper quadrant pain: Secondary | ICD-10-CM | POA: Insufficient documentation

## 2021-02-24 DIAGNOSIS — Z91040 Latex allergy status: Secondary | ICD-10-CM | POA: Diagnosis not present

## 2021-02-24 DIAGNOSIS — F17210 Nicotine dependence, cigarettes, uncomplicated: Secondary | ICD-10-CM | POA: Diagnosis not present

## 2021-02-24 DIAGNOSIS — J45909 Unspecified asthma, uncomplicated: Secondary | ICD-10-CM | POA: Insufficient documentation

## 2021-02-24 DIAGNOSIS — R0789 Other chest pain: Secondary | ICD-10-CM | POA: Diagnosis present

## 2021-02-24 LAB — COMPREHENSIVE METABOLIC PANEL
ALT: 40 U/L (ref 0–44)
AST: 33 U/L (ref 15–41)
Albumin: 3.6 g/dL (ref 3.5–5.0)
Alkaline Phosphatase: 73 U/L (ref 38–126)
Anion gap: 9 (ref 5–15)
BUN: 9 mg/dL (ref 6–20)
CO2: 25 mmol/L (ref 22–32)
Calcium: 8.3 mg/dL — ABNORMAL LOW (ref 8.9–10.3)
Chloride: 101 mmol/L (ref 98–111)
Creatinine, Ser: 0.71 mg/dL (ref 0.61–1.24)
GFR, Estimated: >60 mL/min (ref >60)
Glucose, Bld: 75 mg/dL (ref 70–99)
Potassium: 3.4 mmol/L — ABNORMAL LOW (ref 3.5–5.1)
Sodium: 135 mmol/L (ref 135–145)
Total Bilirubin: 0.8 mg/dL (ref 0.3–1.2)
Total Protein: 6.7 g/dL (ref 6.5–8.1)

## 2021-02-24 LAB — TROPONIN I (HIGH SENSITIVITY)
Troponin I (High Sensitivity): 5 ng/L (ref <18)
Troponin I (High Sensitivity): 4 ng/L (ref <18)

## 2021-02-24 LAB — CBC WITH DIFFERENTIAL/PLATELET
Abs Immature Granulocytes: 0.03 10*3/uL (ref 0.00–0.07)
Basophils Absolute: 0 10*3/uL (ref 0.0–0.1)
Basophils Relative: 0 %
Eosinophils Absolute: 0.1 10*3/uL (ref 0.0–0.5)
Eosinophils Relative: 2 %
HCT: 33.4 % — ABNORMAL LOW (ref 39.0–52.0)
Hemoglobin: 10.6 g/dL — ABNORMAL LOW (ref 13.0–17.0)
Immature Granulocytes: 0 %
Lymphocytes Relative: 27 %
Lymphs Abs: 2.3 10*3/uL (ref 0.7–4.0)
MCH: 30.7 pg (ref 26.0–34.0)
MCHC: 31.7 g/dL (ref 30.0–36.0)
MCV: 96.8 fL (ref 80.0–100.0)
Monocytes Absolute: 0.7 10*3/uL (ref 0.1–1.0)
Monocytes Relative: 9 %
Neutro Abs: 5.1 10*3/uL (ref 1.7–7.7)
Neutrophils Relative %: 62 %
Platelets: 179 10*3/uL (ref 150–400)
RBC: 3.45 MIL/uL — ABNORMAL LOW (ref 4.22–5.81)
RDW: 16.3 % — ABNORMAL HIGH (ref 11.5–15.5)
WBC: 8.2 10*3/uL (ref 4.0–10.5)
nRBC: 0 % (ref 0.0–0.2)

## 2021-02-24 LAB — LIPASE, BLOOD: Lipase: 25 U/L (ref 11–51)

## 2021-02-24 MED ORDER — SODIUM CHLORIDE 0.9 % IV BOLUS
1000.0000 mL | Freq: Once | INTRAVENOUS | Status: AC
  Administered 2021-02-24: 1000 mL via INTRAVENOUS

## 2021-02-24 MED ORDER — MORPHINE SULFATE (PF) 4 MG/ML IV SOLN
4.0000 mg | Freq: Once | INTRAVENOUS | Status: AC
Start: 2021-02-24 — End: 2021-02-24
  Administered 2021-02-24: 4 mg via INTRAMUSCULAR

## 2021-02-24 MED ORDER — ALUM & MAG HYDROXIDE-SIMETH 200-200-20 MG/5ML PO SUSP
30.0000 mL | Freq: Once | ORAL | Status: AC
  Administered 2021-02-24: 30 mL via ORAL
  Filled 2021-02-24: qty 30

## 2021-02-24 MED ORDER — LIDOCAINE VISCOUS HCL 2 % MT SOLN
15.0000 mL | Freq: Once | OROMUCOSAL | Status: AC
  Administered 2021-02-24: 15 mL via ORAL
  Filled 2021-02-24: qty 15

## 2021-02-24 MED ORDER — MORPHINE SULFATE (PF) 4 MG/ML IV SOLN
4.0000 mg | Freq: Once | INTRAVENOUS | Status: DC
  Filled 2021-02-24: qty 1

## 2021-02-24 MED ORDER — ONDANSETRON 4 MG PO TBDP: 4.0000 mg | ORAL_TABLET | Freq: Two times a day (BID) | ORAL | 0 refills | Status: DC | PRN

## 2021-02-24 NOTE — ED Notes (Signed)
Hooked patient back up to the monitor patient is resting with call bell in reach 

## 2021-02-24 NOTE — ED Notes (Signed)
Went into pt room to give pain medication and pt had removed IV himself and was dressed and about to walk out the door, provider notified.

## 2021-02-24 NOTE — ED Provider Notes (Signed)
[facility] EMERGENCY DEPARTMENT Provider Note   CSN: [id number] Arrival date & time: 02/24/21  0055     History Chief Complaint  Patient presents with   Chest Pain    [name] is a 50 y.o. male.  The history is provided by the patient and medical records. No language interpreter was used.  Chest Pain  50 year old male significant history of alcohol abuse, alcohol-related pancreatitis, bipolar, polysubstance use, frequent ER visits brought here via EMS for evaluation of abdominal pain.  Patient report for the past 3 days he has had pain to his left upper quadrant.  He described pain as a sharp stabbing burning sensation radiates to his back, persistent, moderate in severity with associated nausea and vomiting up trace of blood.  He report his stool appears "spongy".  His abdominal pain does radiate to his mid chest and felt similar to prior peptic ulcer disease that he has had.  He feels weak and tired, with associated nausea.  He denies fever productive cough dysuria.  He reported he has been doing well not drinking alcohol up in 2 recently and he believes the alcohol has flare of his pancreatitis as this pain felt similar.  He admits he was seen in the ED several days ago for same, and report pain medication given previously did help.  He is scheduled to have a follow-up appointment with his pain specialist today but will need to set another appointment.  He has been compliant with his medication.  States he is avoiding greasy food.  Past Medical History:  Diagnosis Date   Alcoholism /alcohol abuse    Anemia    Anxiety    Arthritis    "knees; arms; elbows" (03/26/2015)   Asthma    Bipolar disorder (HCC)    Chronic bronchitis (HCC)    Chronic lower back pain    Chronic pancreatitis ([place])    Cocaine abuse ([place])    Depression    Family history of adverse reaction to anesthesia    "grandmother gets confused"   Femoral condyle fracture ([place])  03/08/2014   left medial/notes 03/09/2014   GERD (gastroesophageal reflux disease)    H/O hiatal hernia    H/O suicide attempt 10/2012   High cholesterol    History of blood transfusion 10/2012   "when I tried to commit suicide"   History of stomach ulcers    Hypertension    Marijuana abuse, continuous    Migraine    "a few times/year" (03/26/2015)   Pneumonia 1990's X 3   PTSD (post-traumatic stress disorder)    Sickle cell trait ([place])    WPW (Wolff-Parkinson-White syndrome)    [name] 03/06/2013    Patient Active Problem List   Diagnosis Date Noted   Urinary retention    Protein-calorie malnutrition, severe 11/17/2020   Acute pancreatitis 11/15/2020   Seizure ([place]) 11/13/2018   Acute on chronic pancreatitis ([place]) 09/28/2017   Normocytic anemia 12/05/2016   Alcohol use disorder, severe, dependence ([place]) 07/25/2016   Cocaine use disorder, severe, dependence ([place]) 07/25/2016   Major depressive disorder, 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/2016   Chronic pancreatitis ([place]) 05/18/2015   Essential hypertension 02/06/2014   Mood disorder ([place]) 02/06/2014   Pancreatic pseudocyst/cyst 11/25/2013   [name] White pattern seen on electrocardiogram 10/03/2012   TOBACCO ABUSE 03/23/2007    Past Surgical History:  Procedure Laterality Date   BIOPSY  11/25/2017   Procedure: BIOPSY;  Surgeon: Arta Silence, MD;  Location: [facility]  ENDOSCOPY;  Service: Endoscopy;;   BIOPSY  10/14/2018   Procedure: BIOPSY;  Surgeon: Arta Silence, MD;  Location: [place];  Service: Endoscopy;;   CARDIAC CATHETERIZATION     CYST ENTEROSTOMY  01/02/2020   Procedure: CYST ASPIRATION;  Surgeon: Milus Banister, MD;  Location: [facility] ENDOSCOPY;  Service: Endoscopy;;   ESOPHAGOGASTRODUODENOSCOPY (EGD) WITH PROPOFOL N/A 11/25/2017   Procedure: ESOPHAGOGASTRODUODENOSCOPY (EGD) WITH PROPOFOL;  Surgeon: Arta Silence, MD;  Location: [place];  Service: Endoscopy;  Laterality: N/A;    ESOPHAGOGASTRODUODENOSCOPY (EGD) WITH PROPOFOL Left 10/14/2018   Procedure: ESOPHAGOGASTRODUODENOSCOPY (EGD) WITH PROPOFOL;  Surgeon: Arta Silence, MD;  Location: [facility] ENDOSCOPY;  Service: Endoscopy;  Laterality: Left;   ESOPHAGOGASTRODUODENOSCOPY (EGD) WITH PROPOFOL N/A 11/14/2018   Procedure: ESOPHAGOGASTRODUODENOSCOPY (EGD) WITH PROPOFOL;  Surgeon: Laurence Spates, MD;  Location: [facility] ENDOSCOPY;  Service: Gastroenterology;  Laterality: N/A;   ESOPHAGOGASTRODUODENOSCOPY (EGD) WITH PROPOFOL N/A 01/02/2020   Procedure: ESOPHAGOGASTRODUODENOSCOPY (EGD) WITH PROPOFOL;  Surgeon: Milus Banister, MD;  Location: [facility] ENDOSCOPY;  Service: Endoscopy;  Laterality: N/A;   ESOPHAGOGASTRODUODENOSCOPY (EGD) WITH PROPOFOL N/A 10/25/2020   Procedure: ESOPHAGOGASTRODUODENOSCOPY (EGD) WITH PROPOFOL;  Surgeon: Rush Landmark Telford Nab., MD;  Location: [place];  Service: Gastroenterology;  Laterality: N/A;   EUS N/A 01/02/2020   Procedure: UPPER ENDOSCOPIC ULTRASOUND (EUS) RADIAL;  Surgeon: Milus Banister, MD;  Location: [facility] ENDOSCOPY;  Service: Endoscopy;  Laterality: N/A;   EYE SURGERY Left 1990's   "result of trauma"    FACIAL FRACTURE SURGERY Left 1990's   "result of trauma"    FLEXIBLE SIGMOIDOSCOPY N/A 10/25/2020   Procedure: FLEXIBLE SIGMOIDOSCOPY;  Surgeon: Irving Copas., MD;  Location: [place];  Service: Gastroenterology;  Laterality: N/A;   FRACTURE SURGERY     HEMOSTASIS CLIP PLACEMENT  10/25/2020   Procedure: HEMOSTASIS CLIP PLACEMENT;  Surgeon: Irving Copas., MD;  Location: [place];  Service: Gastroenterology;;   HERNIA REPAIR     LEFT HEART CATHETERIZATION WITH CORONARY ANGIOGRAM Right 03/07/2013   Procedure: LEFT HEART CATHETERIZATION WITH CORONARY ANGIOGRAM;  Surgeon: [name], MD;  Location: [place] CATH LAB;  Service: Cardiovascular;  Laterality: Right;   UMBILICAL HERNIA REPAIR     UPPER GASTROINTESTINAL ENDOSCOPY         Family History  Problem Relation Age of  Onset   Hypertension Mother    Cirrhosis Father    Alcoholism Father    Hypertension Father    Melanoma Father    Hypertension Other    Coronary artery disease Other     Social History   Tobacco Use   Smoking status: Every Day    Packs/day: 1.00    Years: 36.00    Pack years: 36.00    Types: Cigarettes, E-cigarettes   Smokeless tobacco: Never  Vaping Use   Vaping Use: Former  Substance Use Topics   Alcohol use: Yes    Comment: last drink 8 days ago   Drug use: Yes    Types: Marijuana, Cocaine    Comment: daily marijuana use; last cocaine use about 3 months ago    Home Medications Prior to Admission medications   Medication Sig Start Date End Date Taking? Authorizing Provider  acetaminophen (TYLENOL) 500 MG tablet Take 500 mg by mouth every 6 (six) hours as needed for moderate pain or headache.    [provider]  albuterol (VENTOLIN HFA) 108 (90 Base) MCG/ACT inhaler Inhale 2 puffs into the lungs every 4 (four) hours as needed for wheezing or shortness of breath. 11/12/19  Noemi Chapel P, DO  cephALEXin (KEFLEX) 500 MG capsule Take 1 capsule (500 mg total) by mouth 2 (two) times daily. Patient not taking: No sig reported 12/01/20   Malvin Johns, MD  cholecalciferol (VITAMIN D) 25 MCG tablet Take 1 tablet (1,000 Units total) by mouth daily. 10/30/20   [name], MD  cyclobenzaprine (FLEXERIL) 10 MG tablet Take 10 mg by mouth 3 (three) times daily as needed for muscle spasms. 11/03/20   [provider]  fluticasone furoate-vilanterol (BREO ELLIPTA) 200-25 MCG/INH AEPB Inhale 1 puff into the lungs daily. Patient taking differently: Inhale 1 puff into the lungs daily as needed (asthma). 11/12/19   [name], DO  folic acid (FOLVITE) 1 MG tablet Take 1 tablet (1 mg total) by mouth daily. 11/26/17   [name], MD  lidocaine (XYLOCAINE) 2 % solution Use as directed 15 mLs in the mouth or throat every 6 (six) hours as needed for mouth pain. 11/21/20    [name], [name], DO  lipase/protease/amylase (CREON) 36000 UNITS CPEP capsule Take 2 capsules (72,000 Units total) by mouth 3 (three) times daily with meals. For pancreatitis 11/05/19   [name], [name], MD  magnesium oxide (MAG-OX) 400 (240 Mg) MG tablet Take 1 tablet by mouth daily. 12/24/20   [provider]  metoprolol (TOPROL-XL) 200 MG 24 hr tablet Take 200 mg by mouth daily. 10/29/20   [provider]  Multiple Vitamin (MULTIVITAMIN WITH MINERALS) TABS tablet Take 1 tablet by mouth daily. 09/06/17   Bonnell Public, MD  omeprazole (PRILOSEC) 40 MG capsule Take 40 mg by mouth daily. 02/16/21   [provider]  ondansetron (ZOFRAN-ODT) 4 MG disintegrating tablet Take 4 mg by mouth 2 (two) times daily as needed for vomiting or nausea. 10/20/20   [provider]  OVER THE COUNTER MEDICATION Take 1 tablet by mouth daily. Iron supplement.    [provider]  oxyCODONE-acetaminophen (PERCOCET/ROXICET) 5-325 MG tablet Take 1 tablet by mouth every 6 (six) hours as needed for up to 10 doses for severe pain. 12/02/20   Luna Fuse, MD  pantoprazole (PROTONIX) 40 MG tablet Take 1 tablet (40 mg total) by mouth daily. 11/21/20 12/21/20  [name], [name], DO  potassium chloride (KLOR-CON) 10 MEQ tablet Take 10 mEq by mouth daily as needed (low potassium).    [provider]  QUEtiapine (SEROQUEL) 50 MG tablet Take 50 mg by mouth at bedtime.    [provider]  sertraline (ZOLOFT) 100 MG tablet Take 1 tablet (100 mg total) by mouth daily. 03/27/20   [name], MD  sucralfate (CARAFATE) 1 g tablet Take 1 tablet (1 g total) by mouth 4 (four) times daily -  with meals and at bedtime. 05/23/20   Kerin Perna, NP  thiamine 100 MG tablet Take 1 tablet (100 mg total) by mouth daily. Patient not taking: No sig reported 10/16/18   [name], MD  verapamil (CALAN-SR) 120 MG CR tablet Take 120 mg by mouth at bedtime. 12/24/20   [provider]  Vitamin D, Ergocalciferol, (DRISDOL) 1.25 MG (50000 UNIT) CAPS capsule Take 50,000 Units by mouth every 7 (seven) days.    [provider]  amitriptyline (ELAVIL) 25 MG tablet Take 1 tablet (25 mg total) by mouth at bedtime. Patient not taking: Reported on 08/08/2019 10/15/18 08/08/19  [name], MD  gabapentin (NEURONTIN) 100 MG capsule Take 1 capsule (100 mg total) by mouth 2 (two) times daily.  Patient not taking: Reported on 08/08/2019 10/15/18 08/08/19  [name], MD  promethazine (PHENERGAN) 25 MG tablet Take 1 tablet (25 mg total) by mouth every 6 (six) hours as needed for nausea or vomiting. Patient not taking: Reported on 08/08/2019 03/02/19 08/08/19  Kinnie Feil, PA-C    Allergies    Robaxin [methocarbamol], Aspirin, Shellfish-derived products, Trazodone and nefazodone, Adhesive [tape], Latex, Toradol [ketorolac tromethamine], Contrast media [iodinated diagnostic agents], and Reglan [metoclopramide]  Review of Systems   Review of Systems  Cardiovascular:  Positive for chest pain.  All other systems reviewed and are negative.  Physical Exam Updated Vital Signs BP (!) 142/96 (BP Location: Right Arm)   Pulse 95   Temp 98.4 F (36.9 C) (Oral)   Resp 16   Ht 5\' 8"  (1.727 m)   Wt 57.2 kg   SpO2 100%   BMI 19.16 kg/m   Physical Exam Vitals and nursing note reviewed.  Constitutional:      General: He is not in acute distress.    Appearance: He is well-developed.  HENT:     Head: Atraumatic.  Eyes:     Conjunctiva/sclera: Conjunctivae normal.  Cardiovascular:     Rate and Rhythm: Tachycardia present.  Pulmonary:     Effort: Pulmonary effort is normal.     Breath sounds: No wheezing or rhonchi.  Abdominal:     Palpations: Abdomen is soft.     Tenderness: There is abdominal tenderness (Tenderness to left upper quadrant on palpation no guarding.).  Musculoskeletal:     Cervical back: Neck supple.  Skin:    Findings: No rash.  Neurological:      Mental Status: He is alert.  Psychiatric:        Mood and Affect: Mood normal.    ED Results / Procedures / Treatments   Labs (all labs ordered are listed, but only abnormal results are displayed) Labs Reviewed  CBC WITH DIFFERENTIAL/PLATELET - Abnormal; Notable for the following components:      Result Value   RBC 3.45 (*)    Hemoglobin 10.6 (*)    HCT 33.4 (*)    RDW 16.3 (*)    All other components within normal limits  COMPREHENSIVE METABOLIC PANEL - Abnormal; Notable for the following components:   Potassium 3.4 (*)    Calcium 8.3 (*)    All other components within normal limits  LIPASE, BLOOD  TROPONIN I (HIGH SENSITIVITY)  TROPONIN I (HIGH SENSITIVITY)    EKG EKG Interpretation  Date/Time:  Wednesday February 24 2021 00:54:45 EDT Ventricular Rate:  104 PR Interval:  124 QRS Duration: 72 QT Interval:  328 QTC Calculation: 431 R Axis:   65 Text Interpretation: Sinus tachycardia Biatrial enlargement Nonspecific T wave abnormality Abnormal ECG Confirmed by Gerlene Fee [phone]) on 02/24/2021 1:08:49 AM  Radiology No results found.  Procedures Procedures   Medications Ordered in ED Medications  alum & mag hydroxide-simeth (MAALOX/MYLANTA) 200-200-20 MG/5ML suspension 30 mL (30 mLs Oral Given 02/24/21 0819)    And  lidocaine (XYLOCAINE) 2 % viscous mouth solution 15 mL (15 mLs Oral Given 02/24/21 0819)  sodium chloride 0.9 % bolus 1,000 mL (1,000 mLs Intravenous New Bag/Given 02/24/21 4259)    ED Course  I have reviewed the triage vital signs and the nursing notes.  Pertinent labs & imaging results that were available during my care of the patient were reviewed by me and considered in my medical decision making (see chart for details).    MDM  Rules/Calculators/A&P                           BP (!) 142/96 (BP Location: Right Arm)   Pulse 95   Temp 98.4 F (36.9 C) (Oral)   Resp 16   Ht 5\' 8"  (1.727 m)   Wt 57.2 kg   SpO2 100%   BMI 19.16 kg/m    Final Clinical Impression(s) / ED Diagnoses Final diagnoses:  Left upper quadrant abdominal pain    Rx / DC Orders ED Discharge Orders          Ordered    ondansetron (ZOFRAN-ODT) 4 MG disintegrating tablet  2 times daily PRN        02/24/21 0944           8:14 AM Patient with history of alcohol induced pancreatitis who is here with left upper quadrant abdominal pain that felt similar to prior pancreatitis.  Last alcohol use was several days prior.  Patient does have a care plan therefore plan to avoid opiate medication use dose.  Patient did request for additional pain management.  I did offer Tylenol but patient refused.  He is tachycardic, IV fluid given.  Since he has history of PUD and having mid chest discomfort, will give GI cocktail.  His symptoms not consistent with ACS.  Pt received GI cocktail and IVF.  Tachycardia improves.  Encourage pt to f/u with his GI specialist and Pain Specialist for further management of his condition. Recommend alcohol cessation.     Domenic Moras, PA-C 02/24/21 1022    Luna Fuse, MD 02/26/21 [phone]

## 2021-02-24 NOTE — Discharge Instructions (Signed)
Your pain is likely induced by recent alcohol use.  Please refrain from alcohol usage as it may worsen your symptoms.  Take zofran as needed for nausea.  Follow up with your GI specialist and pain specialist for further management of your condition.

## 2021-02-24 NOTE — ED Provider Notes (Signed)
Emergency Medicine Provider Triage Evaluation Note  [name] , a 50 y.o. male  was evaluated in triage.  Pt complains of chest pain and epigastric pain.  States that it feels similar to prior pancreatitis.  Also reports vomiting some blood.  Review of Systems  Positive: Chest pain, hemetemesis Negative: Fever, chills  Physical Exam  BP (!) 152/99   Pulse (!) 104   Temp 99 F (37.2 C) (Oral)   Resp 18   Ht 5\' 8"  (1.727 m)   Wt 57.2 kg   SpO2 98%   BMI 19.16 kg/m  Gen:   Awake, no distress   Resp:  Normal effort  MSK:   Moves extremities without difficulty  Other:    Medical Decision Making  Medically screening exam initiated at 1:09 AM.  Appropriate orders placed.  [name] was informed that the remainder of the evaluation will be completed by another provider, this initial triage assessment does not replace that evaluation, and the importance of remaining in the ED until their evaluation is complete.  Chest pain   Montine Circle, PA-C 02/24/21 0109    Ezequiel Essex, MD 02/24/21 [phone]

## 2021-02-24 NOTE — ED Triage Notes (Signed)
Pt here via EMS d/t sudden onset left sided chest pain. Pt also stating that he's been vomiting blood. Pt denies any alcohol use. Pt also states that he's having back pain and abdominal pain. Pt states he's been having loose stools that's been frequent. Pt states symptoms are similar to previous admission.   150/90 HR:120 RR: 16 SpO2: 99% CBG: 160

## 2021-02-25 ENCOUNTER — Ambulatory Visit: Payer: 59 | Admitting: Gastroenterology

## 2021-03-23 ENCOUNTER — Emergency Department (HOSPITAL_COMMUNITY)
Admission: EM | Admit: 2021-03-23 | Discharge: 2021-03-24 | Disposition: A | Discharge status: Home / Self Care | Payer: 59 | Attending: Emergency Medicine | Admitting: Emergency Medicine

## 2021-03-23 ENCOUNTER — Emergency Department (HOSPITAL_COMMUNITY): Payer: 59

## 2021-03-23 DIAGNOSIS — IMO0002 Reserved for concepts with insufficient information to code with codable children: Secondary | ICD-10-CM

## 2021-03-23 DIAGNOSIS — I10 Essential (primary) hypertension: Secondary | ICD-10-CM | POA: Diagnosis not present

## 2021-03-23 DIAGNOSIS — F17210 Nicotine dependence, cigarettes, uncomplicated: Secondary | ICD-10-CM | POA: Diagnosis not present

## 2021-03-23 DIAGNOSIS — K219 Gastro-esophageal reflux disease without esophagitis: Secondary | ICD-10-CM | POA: Diagnosis not present

## 2021-03-23 DIAGNOSIS — K861 Other chronic pancreatitis: Secondary | ICD-10-CM | POA: Diagnosis not present

## 2021-03-23 DIAGNOSIS — K859 Acute pancreatitis without necrosis or infection, unspecified: Secondary | ICD-10-CM

## 2021-03-23 DIAGNOSIS — Z91040 Latex allergy status: Secondary | ICD-10-CM | POA: Insufficient documentation

## 2021-03-23 DIAGNOSIS — J45909 Unspecified asthma, uncomplicated: Secondary | ICD-10-CM | POA: Insufficient documentation

## 2021-03-23 DIAGNOSIS — R1013 Epigastric pain: Secondary | ICD-10-CM | POA: Diagnosis present

## 2021-03-23 DIAGNOSIS — Z79899 Other long term (current) drug therapy: Secondary | ICD-10-CM | POA: Diagnosis not present

## 2021-03-23 LAB — URINALYSIS, ROUTINE W REFLEX MICROSCOPIC
Bilirubin Urine: NEGATIVE
Glucose, UA: NEGATIVE mg/dL
Hgb urine dipstick: NEGATIVE
Ketones, ur: NEGATIVE mg/dL
Leukocytes,Ua: NEGATIVE
Nitrite: NEGATIVE
Protein, ur: NEGATIVE mg/dL
Specific Gravity, Urine: 1.01 (ref 1.005–1.030)
pH: 5 (ref 5.0–8.0)

## 2021-03-23 LAB — CBC
HCT: 33.3 % — ABNORMAL LOW (ref 39.0–52.0)
Hemoglobin: 10.5 g/dL — ABNORMAL LOW (ref 13.0–17.0)
MCH: 31.5 pg (ref 26.0–34.0)
MCHC: 31.5 g/dL (ref 30.0–36.0)
MCV: 100 fL (ref 80.0–100.0)
Platelets: 203 10*3/uL (ref 150–400)
RBC: 3.33 MIL/uL — ABNORMAL LOW (ref 4.22–5.81)
RDW: 15.3 % (ref 11.5–15.5)
WBC: 7.7 10*3/uL (ref 4.0–10.5)
nRBC: 0 % (ref 0.0–0.2)

## 2021-03-23 LAB — BASIC METABOLIC PANEL
Anion gap: 7 (ref 5–15)
BUN: 8 mg/dL (ref 6–20)
CO2: 21 mmol/L — ABNORMAL LOW (ref 22–32)
Calcium: 8.8 mg/dL — ABNORMAL LOW (ref 8.9–10.3)
Chloride: 106 mmol/L (ref 98–111)
Creatinine, Ser: 0.87 mg/dL (ref 0.61–1.24)
GFR, Estimated: >60 mL/min (ref >60)
Glucose, Bld: 94 mg/dL (ref 70–99)
Potassium: 4.6 mmol/L (ref 3.5–5.1)
Sodium: 134 mmol/L — ABNORMAL LOW (ref 135–145)

## 2021-03-23 LAB — LIPASE, BLOOD: Lipase: 37 U/L (ref 11–51)

## 2021-03-23 LAB — TROPONIN I (HIGH SENSITIVITY): Troponin I (High Sensitivity): 6 ng/L (ref <18)

## 2021-03-23 NOTE — ED Triage Notes (Addendum)
Pt to ED via EMS from home c/o chest pain that started 1 hour ago. Reports midline sharp chest pain radiating to jaw. No medications given by EMS. Hx, WPW,Angina, HTN, Hep C. Last VS: 181/109, P 67, 97%RA, RR18.

## 2021-03-23 NOTE — ED Notes (Signed)
Pt reports blood in stools that started today, reports diarrhea.

## 2021-03-23 NOTE — ED Provider Notes (Signed)
Emergency Medicine Provider Triage Evaluation Note  [name] , a 50 y.o. male  was evaluated in triage.  Pt complains of chest and abdominal pain possibly some blood in his stools history of pancreatitis.  Review of Systems  Positive: Chest pain abdominal pain Negative: Fevers  Physical Exam  BP (!) 174/111 (BP Location: Right Arm)   Pulse 68   Temp 99.1 F (37.3 C) (Oral)   Resp 16   Ht 5\' 8"  (1.727 m)   Wt 54.4 kg   SpO2 100%   BMI 18.25 kg/m  Gen:   Awake, no distress   Resp:  Normal effort  MSK:   Moves extremities without difficulty  Other:    Medical Decision Making  Medically screening exam initiated at 9:34 PM.  Appropriate orders placed.  [name] was informed that the remainder of the evaluation will be completed by another provider, this initial triage assessment does not replace that evaluation, and the importance of remaining in the ED until their evaluation is complete.     Jason Rasmussen, MD 03/24/21 1235

## 2021-03-24 LAB — TROPONIN I (HIGH SENSITIVITY): Troponin I (High Sensitivity): 4 ng/L (ref <18)

## 2021-03-24 MED ORDER — HYOSCYAMINE SULFATE 0.125 MG SL SUBL
0.2500 mg | SUBLINGUAL_TABLET | Freq: Once | SUBLINGUAL | Status: AC
  Administered 2021-03-24: 0.25 mg via SUBLINGUAL
  Filled 2021-03-24: qty 2

## 2021-03-24 MED ORDER — OXYCODONE HCL 5 MG PO TABS: 2.5000 mg | ORAL_TABLET | Freq: Four times a day (QID) | ORAL | 0 refills | Status: AC | PRN

## 2021-03-24 MED ORDER — SUCRALFATE 1 GM/10ML PO SUSP
1.0000 g | Freq: Once | ORAL | Status: AC
  Administered 2021-03-24: 1 g via ORAL
  Filled 2021-03-24: qty 10

## 2021-03-24 MED ORDER — OXYCODONE HCL 5 MG PO TABS
2.5000 mg | ORAL_TABLET | Freq: Once | ORAL | Status: AC
  Administered 2021-03-24: 2.5 mg via ORAL
  Filled 2021-03-24: qty 1

## 2021-03-24 MED ORDER — ALUM & MAG HYDROXIDE-SIMETH 200-200-20 MG/5ML PO SUSP
30.0000 mL | Freq: Once | ORAL | Status: AC
  Administered 2021-03-24: 30 mL via ORAL
  Filled 2021-03-24: qty 30

## 2021-03-24 MED ORDER — LIDOCAINE VISCOUS HCL 2 % MT SOLN
15.0000 mL | Freq: Once | OROMUCOSAL | Status: AC
  Administered 2021-03-24: 15 mL via ORAL
  Filled 2021-03-24: qty 15

## 2021-03-24 MED ORDER — PROCHLORPERAZINE MALEATE 5 MG PO TABS
10.0000 mg | ORAL_TABLET | Freq: Once | ORAL | Status: AC
  Administered 2021-03-24: 10 mg via ORAL
  Filled 2021-03-24: qty 2

## 2021-03-24 NOTE — ED Provider Notes (Signed)
[facility] EMERGENCY DEPARTMENT Provider Note  CSN: [id number] Arrival date & time: 03/23/21 2113  Chief Complaint(s) Chest Pain and Abdominal Pain  HPI [name] is a 50 y.o. male   The history is provided by the patient.  Abdominal Pain Pain location:  Epigastric Pain quality: aching, sharp and stabbing   Pain radiates to:  Chest Pain severity:  Moderate Onset quality:  Gradual Duration:  10 hours Timing:  Constant Progression:  Waxing and waning Chronicity:  Recurrent Context: eating   Context: not alcohol use (denies), not retching, not sick contacts and not suspicious food intake   Relieved by:  Nothing Worsened by:  Eating Associated symptoms: diarrhea   Associated symptoms: no cough, no dysuria, no fatigue, no fever, no melena, no nausea, no shortness of breath and no vomiting   Risk factors: alcohol abuse    Past Medical History Past Medical History:  Diagnosis Date   Alcoholism /alcohol abuse    Anemia    Anxiety    Arthritis    "knees; arms; elbows" (03/26/2015)   Asthma    Bipolar disorder (HCC)    Chronic bronchitis (HCC)    Chronic lower back pain    Chronic pancreatitis ([place])    Cocaine abuse ([place])    Depression    Family history of adverse reaction to anesthesia    "grandmother gets confused"   Femoral condyle fracture ([place]) 03/08/2014   left medial/notes 03/09/2014   GERD (gastroesophageal reflux disease)    H/O hiatal hernia    H/O suicide attempt 10/2012   High cholesterol    History of blood transfusion 10/2012   "when I tried to commit suicide"   History of stomach ulcers    Hypertension    Marijuana abuse, continuous    Migraine    "a few times/year" (03/26/2015)   Pneumonia 1990's X 3   PTSD (post-traumatic stress disorder)    Sickle cell trait ([place])    WPW (Wolff-Parkinson-White syndrome)    [name] 03/06/2013   Patient Active Problem List   Diagnosis Date Noted   Urinary retention     Protein-calorie malnutrition, severe 11/17/2020   Acute pancreatitis 11/15/2020   Seizure ([place]) 11/13/2018   Acute on chronic pancreatitis ([place]) 09/28/2017   Normocytic anemia 12/05/2016   Alcohol use disorder, severe, dependence ([place]) 07/25/2016   Cocaine use disorder, severe, dependence ([place]) 07/25/2016   Major depressive disorder, recurrent severe without psychotic features ([place]) 07/20/2016   Chronic pancreatitis ([place]) 05/18/2015   Essential hypertension 02/06/2014   Mood disorder ([place]) 02/06/2014   Pancreatic pseudocyst/cyst 11/25/2013   [name] Parkinson White pattern seen on electrocardiogram 10/03/2012   TOBACCO ABUSE 03/23/2007   Home Medication(s) Prior to Admission medications   Medication Sig Start Date End Date Taking? Authorizing Provider  acetaminophen (TYLENOL) 500 MG tablet Take 500 mg by mouth every 6 (six) hours as needed for moderate pain or headache.   Yes [provider]  albuterol (VENTOLIN HFA) 108 (90 Base) MCG/ACT inhaler Inhale 2 puffs into the lungs every 4 (four) hours as needed for wheezing or shortness of breath. 11/12/19  Yes Jason Hy, DO  cholecalciferol (VITAMIN D) 25 MCG tablet Take 1 tablet (1,000 Units total) by mouth daily. 10/30/20  Yes Jason Reichert, MD  cyclobenzaprine (FLEXERIL) 10 MG tablet Take 10 mg by mouth 3 (three) times daily as needed for muscle spasms. 11/03/20  Yes [provider]  fluticasone furoate-vilanterol (BREO ELLIPTA) 200-25 MCG/INH AEPB Inhale 1 puff into  the lungs daily. Patient taking differently: Inhale 1 puff into the lungs daily as needed (asthma). 11/12/19  Yes Jason Hy, DO  folic acid (FOLVITE) 1 MG tablet Take 1 tablet (1 mg total) by mouth daily. 11/26/17  Yes Jason Lose, MD  lidocaine (XYLOCAINE) 2 % solution Use as directed 15 mLs in the mouth or throat every 6 (six) hours as needed for mouth pain. 11/21/20  Yes [name], [name], DO  lipase/protease/amylase (CREON) 36000 UNITS CPEP capsule Take  2 capsules (72,000 Units total) by mouth 3 (three) times daily with meals. For pancreatitis 11/05/19  Yes [name], Jason Raspberry, MD  magnesium oxide (MAG-OX) 400 (240 Mg) MG tablet Take 400 mg by mouth daily. 12/24/20  Yes [provider]  metoprolol (TOPROL-XL) 200 MG 24 hr tablet Take 200 mg by mouth daily. 10/29/20  Yes [provider]  Multiple Vitamin (MULTIVITAMIN WITH MINERALS) TABS tablet Take 1 tablet by mouth daily. 09/06/17  Yes Jason Public, MD  omeprazole (PRILOSEC) 40 MG capsule Take 40 mg by mouth 2 (two) times daily as needed (heartburn). 02/16/21  Yes [provider]  ondansetron (ZOFRAN-ODT) 4 MG disintegrating tablet Take 1 tablet (4 mg total) by mouth 2 (two) times daily as needed for vomiting or nausea. 02/24/21  Yes Jason Moras, PA-C  OVER THE COUNTER MEDICATION Take 1 tablet by mouth daily. Iron supplement.   Yes [provider]  oxyCODONE (ROXICODONE) 5 MG immediate release tablet Take 0.5-1 tablets (2.5-5 mg total) by mouth every 6 (six) hours as needed for up to 5 days for severe pain. 03/24/21 03/29/21 Yes [name], Jason Sessions, MD  Polyethyl Glycol-Propyl Glycol (SYSTANE OP) Place 1 drop into both eyes daily as needed (dry eyes).   Yes [provider]  potassium chloride (KLOR-CON) 10 MEQ tablet Take 10 mEq by mouth daily as needed (low potassium).   Yes [provider]  QUEtiapine (SEROQUEL) 50 MG tablet Take 50 mg by mouth at bedtime.   Yes [provider]  sertraline (ZOLOFT) 100 MG tablet Take 1 tablet (100 mg total) by mouth daily. 03/27/20  Yes Jason Rakes, MD  sucralfate (CARAFATE) 1 g tablet Take 1 tablet (1 g total) by mouth 4 (four) times daily -  with meals and at bedtime. 05/23/20  Yes Jason Perna, NP  thiamine 100 MG tablet Take 1 tablet (100 mg total) by mouth daily. 10/16/18  Yes Jason Hamman, MD  verapamil (CALAN-SR) 120 MG CR tablet Take 120 mg by mouth at bedtime. 12/24/20  Yes  [provider]  Vitamin D, Ergocalciferol, (DRISDOL) 1.25 MG (50000 UNIT) CAPS capsule Take 50,000 Units by mouth every Wednesday.   Yes [provider]  pantoprazole (PROTONIX) 40 MG tablet Take 1 tablet (40 mg total) by mouth daily. Patient not taking: Reported on 03/24/2021 11/21/20 12/21/20  [name], [name], DO  amitriptyline (ELAVIL) 25 MG tablet Take 1 tablet (25 mg total) by mouth at bedtime. Patient not taking: Reported on 08/08/2019 10/15/18 08/08/19  Jason Hamman, MD  gabapentin (NEURONTIN) 100 MG capsule Take 1 capsule (100 mg total) by mouth 2 (two) times daily. Patient not taking: Reported on 08/08/2019 10/15/18 08/08/19  Jason Hamman, MD  promethazine (PHENERGAN) 25 MG tablet Take 1 tablet (25 mg total) by mouth every 6 (six) hours as needed for nausea or vomiting. Patient not taking: Reported on 08/08/2019 03/02/19 08/08/19  Kinnie Feil, PA-C  Past Surgical History Past Surgical History:  Procedure Laterality Date   BIOPSY  11/25/2017   Procedure: BIOPSY;  Surgeon: Arta Silence, MD;  Location: [facility] ENDOSCOPY;  Service: Endoscopy;;   BIOPSY  10/14/2018   Procedure: BIOPSY;  Surgeon: Arta Silence, MD;  Location: [place];  Service: Endoscopy;;   CARDIAC CATHETERIZATION     CYST ENTEROSTOMY  01/02/2020   Procedure: CYST ASPIRATION;  Surgeon: Milus Banister, MD;  Location: [facility] ENDOSCOPY;  Service: Endoscopy;;   ESOPHAGOGASTRODUODENOSCOPY (EGD) WITH PROPOFOL Moran/A 11/25/2017   Procedure: ESOPHAGOGASTRODUODENOSCOPY (EGD) WITH PROPOFOL;  Surgeon: Arta Silence, MD;  Location: [facility] ENDOSCOPY;  Service: Endoscopy;  Laterality: Moran/A;   ESOPHAGOGASTRODUODENOSCOPY (EGD) WITH PROPOFOL Left 10/14/2018   Procedure: ESOPHAGOGASTRODUODENOSCOPY (EGD) WITH PROPOFOL;  Surgeon: Arta Silence, MD;  Location[facility] ENDOSCOPY;  Service: Endoscopy;  Laterality:  Left;   ESOPHAGOGASTRODUODENOSCOPY (EGD) WITH PROPOFOL Moran/A 11/14/2018   Procedure: ESOPHAGOGASTRODUODENOSCOPY (EGD) WITH PROPOFOL;  Surgeon: Laurence Spates, MD;  Location: [facility] ENDOSCOPY;  Service: Gastroenterology;  Laterality: Moran/A;   ESOPHAGOGASTRODUODENOSCOPY (EGD) WITH PROPOFOL Moran/A 01/02/2020   Procedure: ESOPHAGOGASTRODUODENOSCOPY (EGD) WITH PROPOFOL;  Surgeon: Milus Banister, MD;  Location: [facility] ENDOSCOPY;  Service: Endoscopy;  Laterality: Moran/A;   ESOPHAGOGASTRODUODENOSCOPY (EGD) WITH PROPOFOL Moran/A 10/25/2020   Procedure: ESOPHAGOGASTRODUODENOSCOPY (EGD) WITH PROPOFOL;  Surgeon: Rush Landmark Telford Nab., MD;  Location: [place];  Service: Gastroenterology;  Laterality: Moran/A;   EUS Moran/A 01/02/2020   Procedure: UPPER ENDOSCOPIC ULTRASOUND (EUS) RADIAL;  Surgeon: Milus Banister, MD;  Location: [facility] ENDOSCOPY;  Service: Endoscopy;  Laterality: Moran/A;   EYE SURGERY Left 1990's   "result of trauma"    FACIAL FRACTURE SURGERY Left 1990's   "result of trauma"    FLEXIBLE SIGMOIDOSCOPY Moran/A 10/25/2020   Procedure: FLEXIBLE SIGMOIDOSCOPY;  Surgeon: Irving Copas., MD;  Location: [place];  Service: Gastroenterology;  Laterality: Moran/A;   FRACTURE SURGERY     HEMOSTASIS CLIP PLACEMENT  10/25/2020   Procedure: HEMOSTASIS CLIP PLACEMENT;  Surgeon: Irving Copas., MD;  Location: [place];  Service: Gastroenterology;;   HERNIA REPAIR     LEFT HEART CATHETERIZATION WITH CORONARY ANGIOGRAM Right 03/07/2013   Procedure: LEFT HEART CATHETERIZATION WITH CORONARY ANGIOGRAM;  Surgeon: [name], MD;  Location: [place] CATH LAB;  Service: Cardiovascular;  Laterality: Right;   UMBILICAL HERNIA REPAIR     UPPER GASTROINTESTINAL ENDOSCOPY     Family History Family History  Problem Relation Age of Onset   Hypertension Mother    Cirrhosis Father    Alcoholism Father    Hypertension Father    Melanoma Father    Hypertension Other    Coronary artery disease Other     Social History Social  History   Tobacco Use   Smoking status: Every Day    Packs/day: 1.00    Years: 36.00    Pack years: 36.00    Types: Cigarettes, E-cigarettes   Smokeless tobacco: Never  Vaping Use   Vaping Use: Former  Substance Use Topics   Alcohol use: Yes    Comment: last drink 8 days ago   Drug use: Yes    Types: Marijuana, Cocaine    Comment: daily marijuana use; last cocaine use about 3 months ago   Allergies Robaxin [methocarbamol], Aspirin, Shellfish-derived products, Trazodone and nefazodone, Adhesive [tape], Latex, Toradol [ketorolac tromethamine], Contrast media [iodinated diagnostic agents], and Reglan [metoclopramide]  Review of Systems Review of Systems  Constitutional:  Negative for fatigue and fever.  Respiratory:  Negative for cough and shortness of breath.  Gastrointestinal:  Positive for diarrhea. Negative for melena, nausea and vomiting.  Genitourinary:  Negative for dysuria.  All other systems are reviewed and are negative for acute change except as noted in the HPI  Physical Exam Vital Signs  I have reviewed the triage vital signs BP (!) 152/97   Pulse 75   Temp 98.3 F (36.8 C) (Oral)   Resp 16   Ht 5\' 8"  (1.727 m)   Wt 54.4 kg   SpO2 100%   BMI 18.25 kg/m   Physical Exam Vitals reviewed.  Constitutional:      General: He is not in acute distress.    Appearance: He is well-developed. He is not diaphoretic.  HENT:     Head: Normocephalic and atraumatic.     Right Ear: External ear normal.     Left Ear: External ear normal.     Nose: Nose normal.     Mouth/Throat:     Mouth: Mucous membranes are moist.  Eyes:     General: No scleral icterus.    Conjunctiva/sclera: Conjunctivae normal.  Neck:     Trachea: Phonation normal.  Cardiovascular:     Rate and Rhythm: Normal rate and regular rhythm.  Pulmonary:     Effort: Pulmonary effort is normal. No respiratory distress.     Breath sounds: No stridor.  Abdominal:     General: There is no distension.      Tenderness: There is abdominal tenderness in the epigastric area. There is no guarding or rebound.  Musculoskeletal:        General: Normal range of motion.     Cervical back: Normal range of motion.  Neurological:     Mental Status: He is alert and oriented to person, place, and time.  Psychiatric:        Behavior: Behavior normal.    ED Results and Treatments Labs (all labs ordered are listed, but only abnormal results are displayed) Labs Reviewed  BASIC METABOLIC PANEL - Abnormal; Notable for the following components:      Result Value   Sodium 134 (*)    CO2 21 (*)    Calcium 8.8 (*)    All other components within normal limits  CBC - Abnormal; Notable for the following components:   RBC 3.33 (*)    Hemoglobin 10.5 (*)    HCT 33.3 (*)    All other components within normal limits  URINALYSIS, ROUTINE W REFLEX MICROSCOPIC - Abnormal; Notable for the following components:   Color, Urine STRAW (*)    All other components within normal limits  LIPASE, BLOOD  TROPONIN I (HIGH SENSITIVITY)  TROPONIN I (HIGH SENSITIVITY)                                                                                                                         EKG  EKG Interpretation  Date/Time:  Tuesday March 23 2021 21:20:29 EST Ventricular Rate:  66 PR Interval:  104 QRS Duration: 74 QT  Interval:  354 QTC Calculation: 371 R Axis:   62 Text Interpretation: Sinus rhythm with short PR Nonspecific T wave abnormality Abnormal ECG No significant change was found Confirmed by Addison Lank [phone]) on 03/24/2021 5:42:41 AM       Radiology DG Chest 2 View  Result Date: 03/23/2021 CLINICAL DATA:  Chest pain EXAM: CHEST - 2 VIEW COMPARISON:  11/18/2020 FINDINGS: The heart size and mediastinal contours are within normal limits. Both lungs are clear. The visualized skeletal structures are unremarkable. IMPRESSION: No active cardiopulmonary disease. Electronically Signed   By: Fidela Salisbury  M.D.   On: 03/23/2021 21:56    Pertinent labs & imaging results that were available during my care of the patient were reviewed by me and considered in my medical decision making (see MDM for details).  Medications Ordered in ED Medications  oxyCODONE (Oxy IR/ROXICODONE) immediate release tablet 2.5 mg (has no administration in time range)  alum & mag hydroxide-simeth (MAALOX/MYLANTA) 200-200-20 MG/5ML suspension 30 mL (30 mLs Oral Given 03/24/21 0612)    And  lidocaine (XYLOCAINE) 2 % viscous mouth solution 15 mL (15 mLs Oral Given 03/24/21 0612)  hyoscyamine (LEVSIN SL) SL tablet 0.25 mg (0.25 mg Sublingual Given 03/24/21 0611)  sucralfate (CARAFATE) 1 GM/10ML suspension 1 g (1 g Oral Given 03/24/21 0612)  prochlorperazine (COMPAZINE) tablet 10 mg (10 mg Oral Given 03/24/21 2426)                                                                                                                                     Procedures Procedures  (including critical care time)  Medical Decision Making / ED Course I have reviewed the nursing notes for this encounter and the patient's prior records (if available in EHR or on provided paperwork).  [name] was evaluated in Emergency Department on 03/24/2021 for the symptoms described in the history of present illness. He was evaluated in the context of the global COVID-19 pandemic, which necessitated consideration that the patient might be at risk for infection with the SARS-CoV-2 virus that causes COVID-19. Institutional protocols and algorithms that pertain to the evaluation of patients at risk for COVID-19 are in a state of rapid change based on information released by regulatory bodies including the CDC and federal and state organizations. These policies and algorithms were followed during the patient's care in the ED.     Chronic abdominal pain, consistent with chronic pancreatitis vs gastritis/PUD. Labs reassuring w/o leukocytosis or  significant anemia. No electrolyte derangements or renal insufficiency. Lipase wnl.  EKG w/o acute ischemic changes or evidence of pericarditis. Serial trops negative. Doubt cardiac process.  Pertinent labs & imaging results that were available during my care of the patient were reviewed by me and considered in my medical decision making:  Provided with oral medication. Tolerating PO.  Final Clinical Impression(s) / ED Diagnoses Final diagnoses:  Pancreatitis, recurrent   The patient  appears reasonably screened and/or stabilized for discharge and I doubt any other medical condition or other [facility] requiring further screening, evaluation, or treatment in the ED at this time prior to discharge. Safe for discharge with strict return precautions.  Disposition: Discharge  Condition: Good  I have discussed the results, Dx and Tx plan with the patient/family who expressed understanding and agree(s) with the plan. Discharge instructions discussed at length. The patient/family was given strict return precautions who verbalized understanding of the instructions. No further questions at time of discharge.    ED Discharge Orders          Ordered    oxyCODONE (ROXICODONE) 5 MG immediate release tablet  Every 6 hours PRN        03/24/21 0723             Follow Up: Primary care provider  Call      This chart was dictated using voice recognition software.  Despite best efforts to proofread,  errors can occur which can change the documentation meaning.    Fatima Blank, MD 03/24/21 [phone]

## 2021-03-24 NOTE — ED Notes (Signed)
Pt provided with cup of water

## 2021-04-05 IMAGING — CT CT ABDOMEN AND PELVIS WITHOUT CONTRAST
2 of 4 series · 15 of 46 positions shown, 17 images · non-contrast
Comparison: 03/18/2017

CLINICAL DATA: Abdominal pain with nausea and vomiting since
yesterday. Polysubstance abuse. History of pancreatitis.

EXAM:
CT ABDOMEN AND PELVIS WITHOUT CONTRAST
TECHNIQUE: Multidetector CT imaging of the abdomen and pelvis was performed
following the standard protocol without IV contrast.

[Series 3: ap without · axial · non-contrast · 0.64mm/px · z∈[+802,+1177]mm · 12 of 85 slices shown, 14 images]
[im 5/85  soft-tissue]
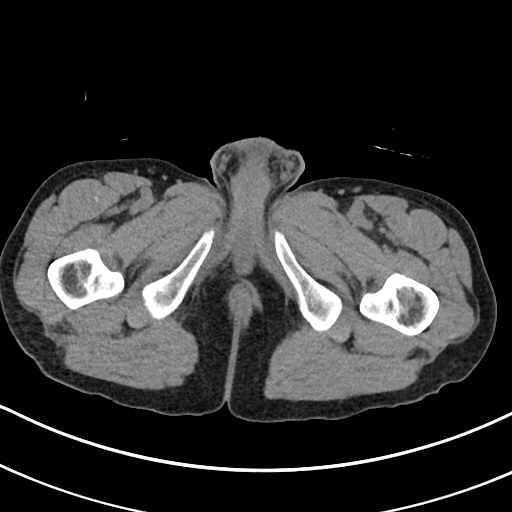
[im 5/85  bone]
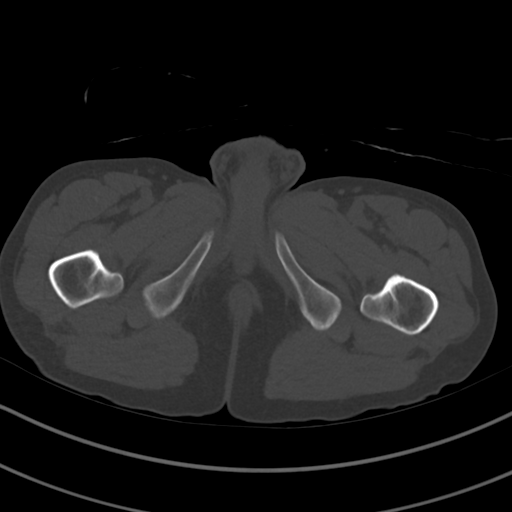
[im 14/85  soft-tissue]
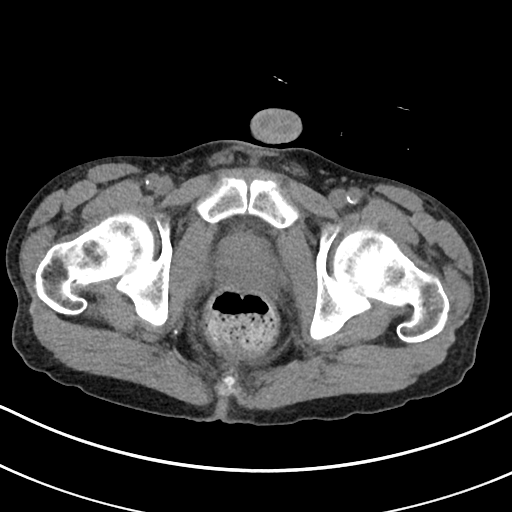
[im 18/85  soft-tissue]
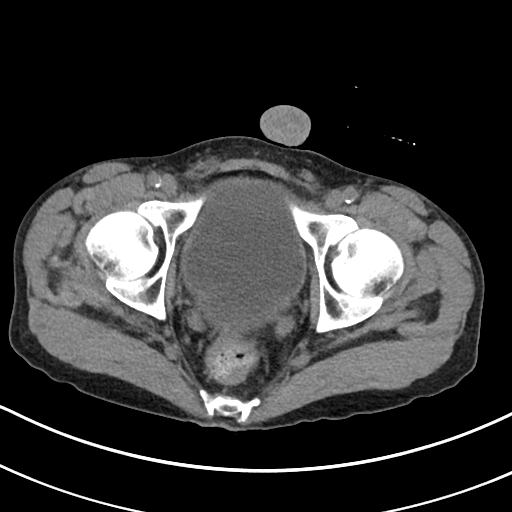
[im 27/85  soft-tissue]
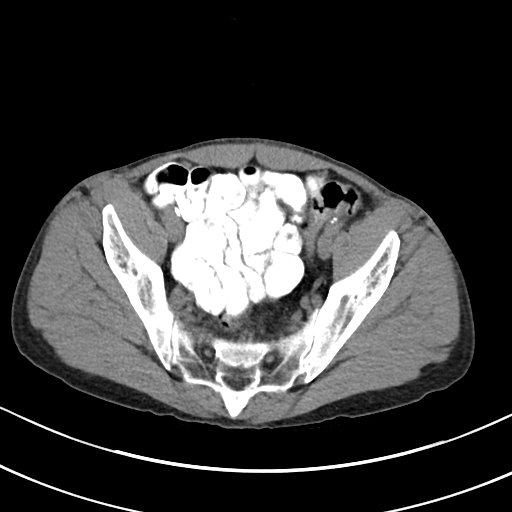
[im 31/85  soft-tissue]
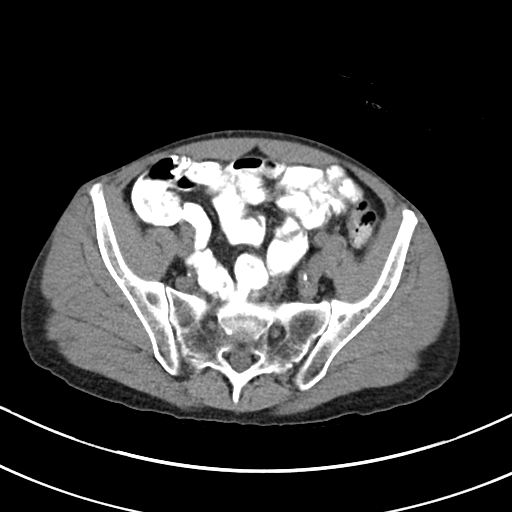
[im 40/85  soft-tissue]
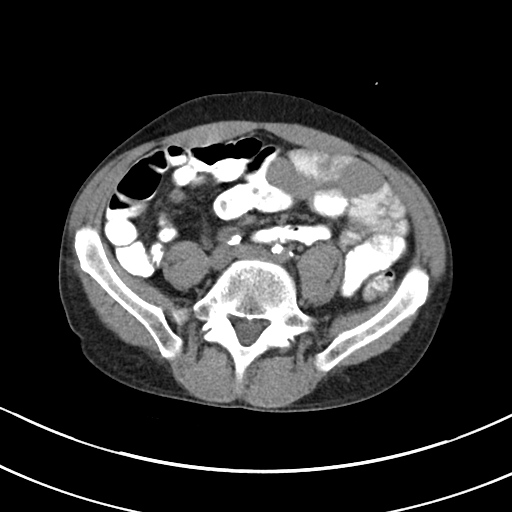
[im 45/85  soft-tissue]
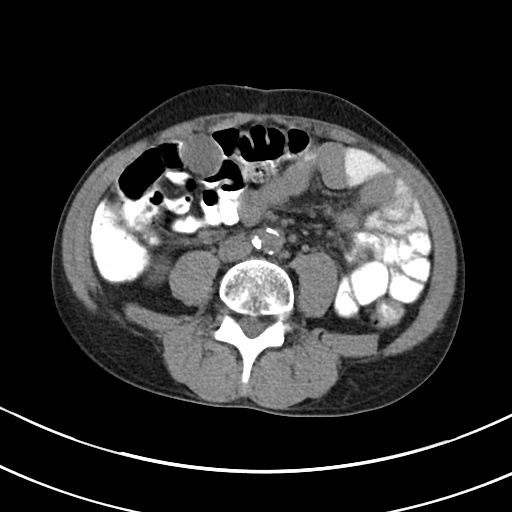
[im 54/85  soft-tissue]
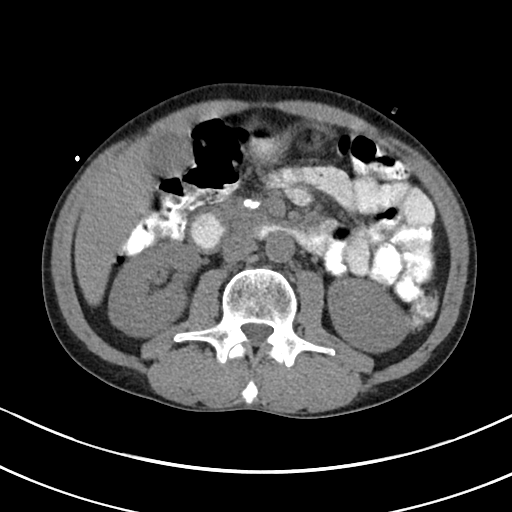
[im 58/85  soft-tissue]
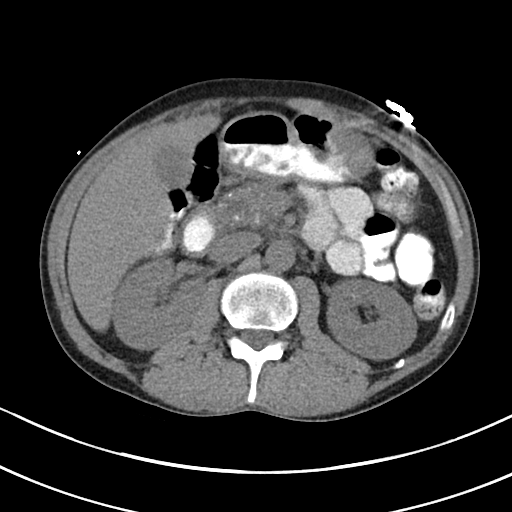
[im 58/85  bone]
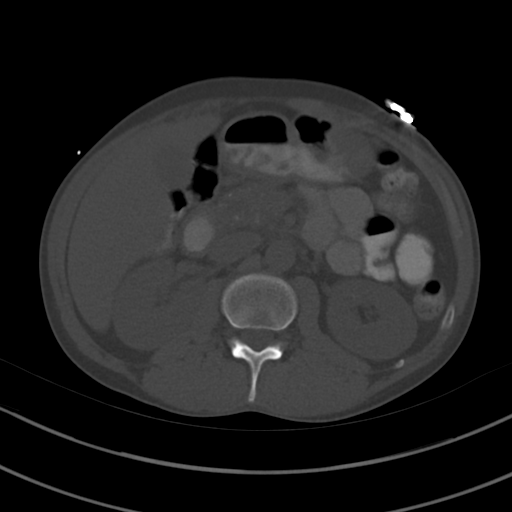
[im 67/85  soft-tissue]
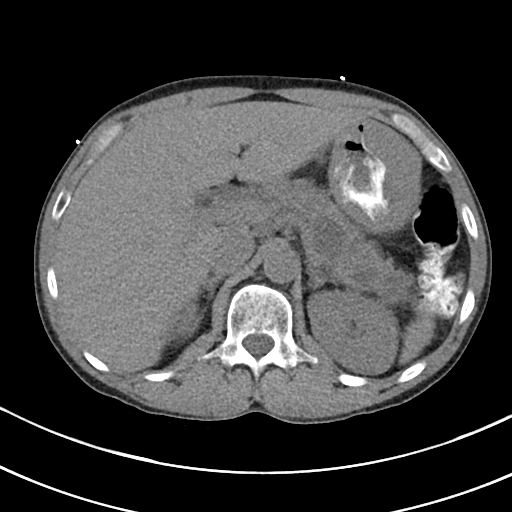
[im 71/85  soft-tissue]
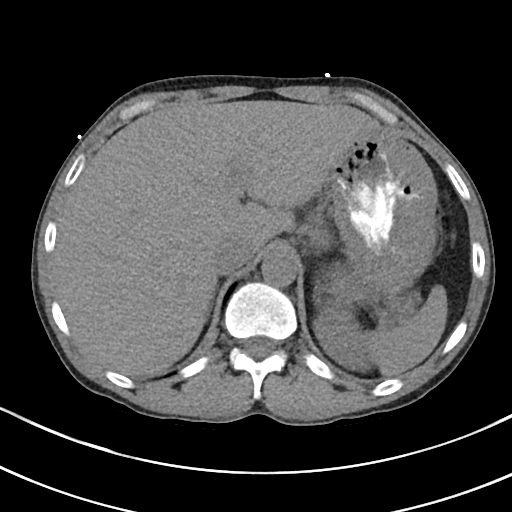
[im 80/85  soft-tissue]
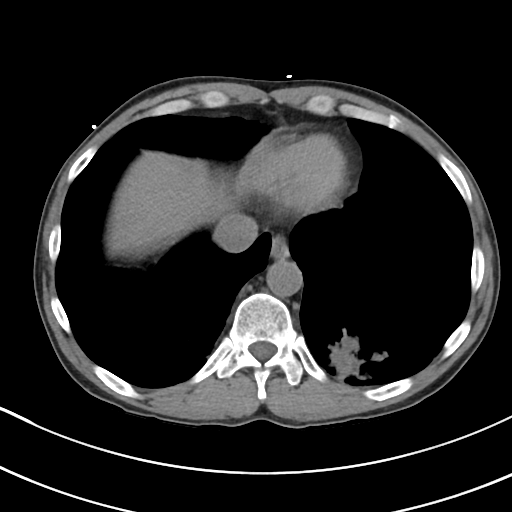

[Series 6: cor · coronal · 0.66mm/px · 3 of 75 slices shown]
[im 25/75  soft-tissue]
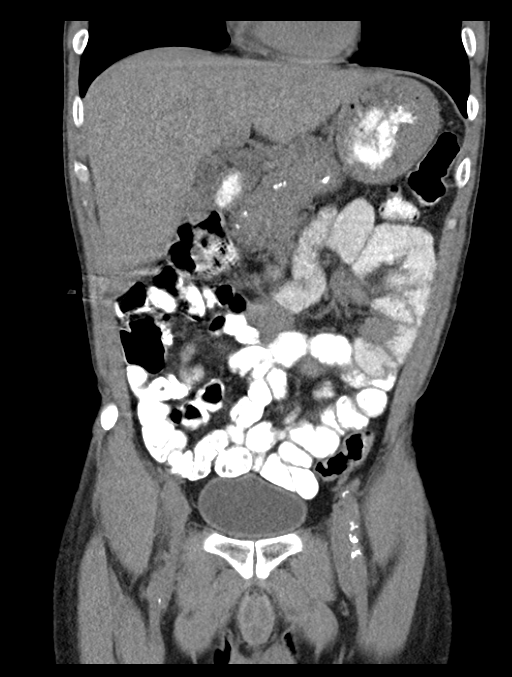
[im 33/75  soft-tissue]
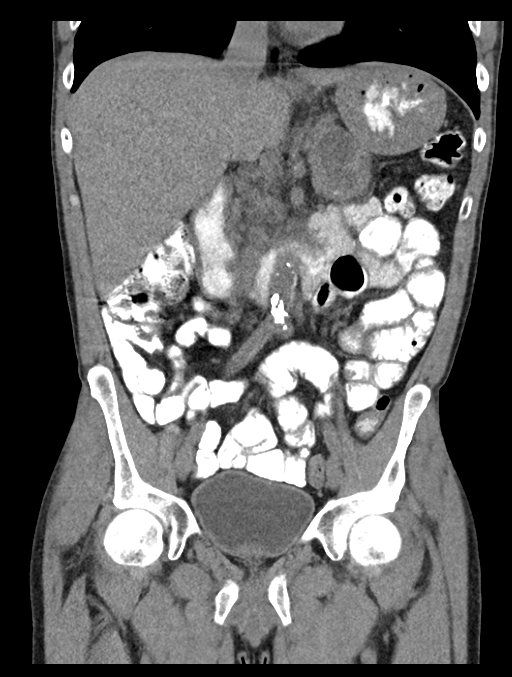
[im 42/75  soft-tissue]
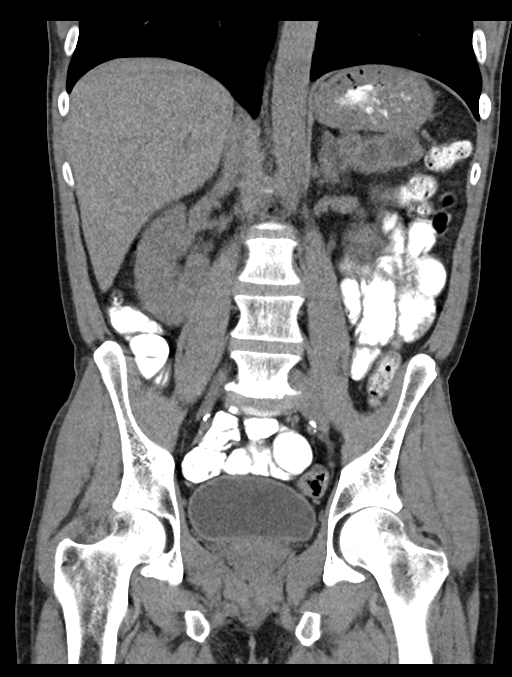

[15 of 46 positions shown; findings below may reference images not displayed]

FINDINGS: Lower chest: Left base airspace disease is new since the prior CT.
Normal heart size without pericardial or pleural effusion.

Hepatobiliary: Normal liver. Normal gallbladder, without biliary
ductal dilatation.

Pancreas: Soft tissue fullness throughout the pancreas again
identified. Central low-density within the pancreas is likely due to
marked pancreatic duct dilatation. This may be slightly progressive
compared to 3089. Most apparent the body and tail. Parenchymal
calcifications are identified within the neck and head. No border
deforming pancreatic mass. Cannot exclude mild peripancreatic edema.

Spleen: Normal in size, without focal abnormality.

Adrenals/Urinary Tract: Normal adrenal glands. Normal kidneys,
without hydronephrosis. No bladder calculi.

Stomach/Bowel: Apparent greater curvature gastric wall thickening,
including at 3.0 cm on [DATE]. Normal terminal ileum. Appendix not
visualized. Normal small bowel.

Vascular/Lymphatic: Aortic and branch vessel atherosclerosis. No
abdominopelvic adenopathy.

Reproductive: Normal prostate.

Other: No significant free fluid.  No free intraperitoneal air.

Musculoskeletal: No acute osseous abnormality. Lumbosacral
spondylosis with disc bulges at L3-4 through L5-S1.
IMPRESSION: 1. Greater curvature gastric wall thickening, suspicious for
gastritis.
2. Findings of chronic calcific pancreatitis with likely secondary
marked duct dilatation in the body and tail. Slightly progressive
compared to 3089. Cannot exclude mild peripancreatic edema.
Recommend clinical exclusion of acute pancreatitis.
3. Left lower lobe airspace disease, suspicious for infection or
aspiration.
4.  Aortic Atherosclerosis ([id number]).  This is age advanced.

## 2021-04-09 ENCOUNTER — Emergency Department (HOSPITAL_COMMUNITY)
Admission: EM | Admit: 2021-04-09 | Discharge: 2021-04-09 | Disposition: A | Discharge status: Home / Self Care | Payer: 59 | Attending: Emergency Medicine | Admitting: Emergency Medicine

## 2021-04-09 ENCOUNTER — Encounter (HOSPITAL_COMMUNITY): Payer: Self-pay | Admitting: Emergency Medicine

## 2021-04-09 DIAGNOSIS — Z5321 Procedure and treatment not carried out due to patient leaving prior to being seen by health care provider: Secondary | ICD-10-CM | POA: Insufficient documentation

## 2021-04-09 DIAGNOSIS — R1010 Upper abdominal pain, unspecified: Secondary | ICD-10-CM | POA: Diagnosis not present

## 2021-04-09 DIAGNOSIS — R079 Chest pain, unspecified: Secondary | ICD-10-CM | POA: Insufficient documentation

## 2021-04-09 DIAGNOSIS — R110 Nausea: Secondary | ICD-10-CM | POA: Diagnosis not present

## 2021-04-09 NOTE — ED Provider Notes (Signed)
Emergency Medicine Provider Triage Evaluation Note  [name] , a 50 y.o. male  was evaluated in triage.  Pt complains of sharp left side CP onset 6am today while sleeping, now a burning pain in the upper abdomen. Associated with nausea, no vomiting. Hx WPW, scheduled for ablation next month.   Review of Systems  Positive: CP, abdominal pain, nausea Negative: SHOB, diaphoresis   Physical Exam  BP (!) 163/102 (BP Location: Right Arm)   Pulse 86   Temp 98.4 F (36.9 C) (Oral)   Resp 18   SpO2 100%  Gen:   Awake, no distress   Resp:  Normal effort  MSK:   Moves extremities without difficulty  Other:    Medical Decision Making  Medically screening exam initiated at 7:13 AM.  Appropriate orders placed.  [name] was informed that the remainder of the evaluation will be completed by another provider, this initial triage assessment does not replace that evaluation, and the importance of remaining in the ED until their evaluation is complete.     Tacy Learn, PA-C 04/09/21 [place], [place], DO 04/09/21 [phone]

## 2021-04-09 NOTE — ED Triage Notes (Signed)
Patient with history of WPW complaining of sharp left sided chest pain radiating into his back. Patient refused NTG and ASA with EMS. Patient also complains of diarrhea. 18g in left forearm saline lock from EMS.   EMS Vitals BP 180/86 HR 90

## 2021-04-10 ENCOUNTER — Other Ambulatory Visit: Payer: Self-pay

## 2021-04-10 ENCOUNTER — Emergency Department (HOSPITAL_COMMUNITY)
Admission: EM | Admit: 2021-04-10 | Discharge: 2021-04-11 | Disposition: A | Discharge status: Home / Self Care | Payer: 59 | Attending: Emergency Medicine | Admitting: Emergency Medicine

## 2021-04-10 ENCOUNTER — Encounter (HOSPITAL_COMMUNITY): Payer: Self-pay | Admitting: Radiology

## 2021-04-10 DIAGNOSIS — I10 Essential (primary) hypertension: Secondary | ICD-10-CM | POA: Diagnosis not present

## 2021-04-10 DIAGNOSIS — Z91040 Latex allergy status: Secondary | ICD-10-CM | POA: Diagnosis not present

## 2021-04-10 DIAGNOSIS — Z7951 Long term (current) use of inhaled steroids: Secondary | ICD-10-CM | POA: Diagnosis not present

## 2021-04-10 DIAGNOSIS — R109 Unspecified abdominal pain: Secondary | ICD-10-CM | POA: Diagnosis present

## 2021-04-10 DIAGNOSIS — F17210 Nicotine dependence, cigarettes, uncomplicated: Secondary | ICD-10-CM | POA: Diagnosis not present

## 2021-04-10 DIAGNOSIS — K219 Gastro-esophageal reflux disease without esophagitis: Secondary | ICD-10-CM | POA: Diagnosis not present

## 2021-04-10 DIAGNOSIS — F1410 Cocaine abuse, uncomplicated: Secondary | ICD-10-CM | POA: Insufficient documentation

## 2021-04-10 DIAGNOSIS — Z955 Presence of coronary angioplasty implant and graft: Secondary | ICD-10-CM | POA: Insufficient documentation

## 2021-04-10 DIAGNOSIS — G8929 Other chronic pain: Secondary | ICD-10-CM

## 2021-04-10 DIAGNOSIS — J45909 Unspecified asthma, uncomplicated: Secondary | ICD-10-CM | POA: Insufficient documentation

## 2021-04-10 DIAGNOSIS — Z79899 Other long term (current) drug therapy: Secondary | ICD-10-CM | POA: Insufficient documentation

## 2021-04-10 DIAGNOSIS — K861 Other chronic pancreatitis: Secondary | ICD-10-CM | POA: Insufficient documentation

## 2021-04-10 LAB — CBC
HCT: 36 % — ABNORMAL LOW (ref 39.0–52.0)
Hemoglobin: 11.7 g/dL — ABNORMAL LOW (ref 13.0–17.0)
MCH: 31.7 pg (ref 26.0–34.0)
MCHC: 32.5 g/dL (ref 30.0–36.0)
MCV: 97.6 fL (ref 80.0–100.0)
Platelets: 155 10*3/uL (ref 150–400)
RBC: 3.69 MIL/uL — ABNORMAL LOW (ref 4.22–5.81)
RDW: 15.2 % (ref 11.5–15.5)
WBC: 9.5 10*3/uL (ref 4.0–10.5)
nRBC: 0 % (ref 0.0–0.2)

## 2021-04-10 NOTE — ED Triage Notes (Signed)
PT states that his stomach hurts into his back. Pt states he has had pancreatitis in the past and drank last night and this morning. Pt feels like this is his pancreatitis acting up.

## 2021-04-11 ENCOUNTER — Emergency Department (HOSPITAL_COMMUNITY): Payer: 59

## 2021-04-11 LAB — COMPREHENSIVE METABOLIC PANEL
ALT: 88 U/L — ABNORMAL HIGH (ref 0–44)
AST: 75 U/L — ABNORMAL HIGH (ref 15–41)
Albumin: 4.5 g/dL (ref 3.5–5.0)
Alkaline Phosphatase: 96 U/L (ref 38–126)
Anion gap: 15 (ref 5–15)
BUN: 20 mg/dL (ref 6–20)
CO2: 16 mmol/L — ABNORMAL LOW (ref 22–32)
Calcium: 7.9 mg/dL — ABNORMAL LOW (ref 8.9–10.3)
Chloride: 102 mmol/L (ref 98–111)
Creatinine, Ser: 1.35 mg/dL — ABNORMAL HIGH (ref 0.61–1.24)
GFR, Estimated: >60 mL/min (ref >60)
Glucose, Bld: 57 mg/dL — ABNORMAL LOW (ref 70–99)
Potassium: 3.8 mmol/L (ref 3.5–5.1)
Sodium: 133 mmol/L — ABNORMAL LOW (ref 135–145)
Total Bilirubin: 1.3 mg/dL — ABNORMAL HIGH (ref 0.3–1.2)
Total Protein: 8.2 g/dL — ABNORMAL HIGH (ref 6.5–8.1)

## 2021-04-11 LAB — RAPID URINE DRUG SCREEN, HOSP PERFORMED
Amphetamines: NOT DETECTED
Barbiturates: NOT DETECTED
Benzodiazepines: NOT DETECTED
Cocaine: POSITIVE — AB
Opiates: NOT DETECTED
Tetrahydrocannabinol: POSITIVE — AB

## 2021-04-11 LAB — I-STAT CHEM 8, ED
BUN: 16 mg/dL (ref 6–20)
Calcium, Ion: 0.99 mmol/L — ABNORMAL LOW (ref 1.15–1.40)
Chloride: 104 mmol/L (ref 98–111)
Creatinine, Ser: 1.2 mg/dL (ref 0.61–1.24)
Glucose, Bld: 69 mg/dL — ABNORMAL LOW (ref 70–99)
HCT: 42 % (ref 39.0–52.0)
Hemoglobin: 14.3 g/dL (ref 13.0–17.0)
Potassium: 3.8 mmol/L (ref 3.5–5.1)
Sodium: 136 mmol/L (ref 135–145)
TCO2: 18 mmol/L — ABNORMAL LOW (ref 22–32)

## 2021-04-11 LAB — ETHANOL: Alcohol, Ethyl (B): <10 mg/dL (ref <10)

## 2021-04-11 LAB — TROPONIN I (HIGH SENSITIVITY)
Troponin I (High Sensitivity): 5 ng/L (ref <18)
Troponin I (High Sensitivity): 5 ng/L (ref <18)

## 2021-04-11 LAB — POC OCCULT BLOOD, ED: Fecal Occult Bld: NEGATIVE

## 2021-04-11 LAB — LIPASE, BLOOD: Lipase: 20 U/L (ref 11–51)

## 2021-04-11 MED ORDER — LIDOCAINE VISCOUS HCL 2 % MT SOLN
15.0000 mL | Freq: Once | OROMUCOSAL | Status: AC
  Administered 2021-04-11: 01:00:00 15 mL via ORAL
  Filled 2021-04-11: qty 15

## 2021-04-11 MED ORDER — SODIUM CHLORIDE 0.9 % IV BOLUS
1000.0000 mL | Freq: Once | INTRAVENOUS | Status: AC
  Administered 2021-04-11: 05:00:00 1000 mL via INTRAVENOUS

## 2021-04-11 MED ORDER — OXYCODONE-ACETAMINOPHEN 5-325 MG PO TABS
1.0000 | ORAL_TABLET | Freq: Once | ORAL | Status: AC
  Administered 2021-04-11: 05:00:00 1 via ORAL
  Filled 2021-04-11: qty 1

## 2021-04-11 MED ORDER — SODIUM CHLORIDE 0.9 % IV BOLUS
1000.0000 mL | Freq: Once | INTRAVENOUS | Status: AC
  Administered 2021-04-11: 02:00:00 1000 mL via INTRAVENOUS

## 2021-04-11 MED ORDER — ONDANSETRON HCL 4 MG/2ML IJ SOLN
4.0000 mg | Freq: Once | INTRAMUSCULAR | Status: AC
  Administered 2021-04-11: 01:00:00 4 mg via INTRAVENOUS
  Filled 2021-04-11: qty 2

## 2021-04-11 MED ORDER — SODIUM CHLORIDE 0.9 % IV BOLUS
1000.0000 mL | Freq: Once | INTRAVENOUS | Status: AC
  Administered 2021-04-11: 01:00:00 1000 mL via INTRAVENOUS

## 2021-04-11 MED ORDER — ALUM & MAG HYDROXIDE-SIMETH 200-200-20 MG/5ML PO SUSP
30.0000 mL | Freq: Once | ORAL | Status: AC
  Administered 2021-04-11: 01:00:00 30 mL via ORAL
  Filled 2021-04-11: qty 30

## 2021-04-11 MED ORDER — SUCRALFATE 1 G PO TABS: 1.0000 g | ORAL_TABLET | Freq: Three times a day (TID) | ORAL | 0 refills | Status: DC

## 2021-04-11 MED ORDER — HYDROMORPHONE HCL 1 MG/ML IJ SOLN
1.0000 mg | Freq: Once | INTRAMUSCULAR | Status: AC
  Administered 2021-04-11: 01:00:00 1 mg via INTRAVENOUS
  Filled 2021-04-11: qty 1

## 2021-04-11 NOTE — Discharge Instructions (Signed)
Stop using cocaine and marijuana.  Stop drinking alcohol.  Take your medications as prescribed and follow-up with your primary doctor.  Return to the ED with new or worsening symptoms.

## 2021-04-11 NOTE — ED Provider Notes (Signed)
[facility] DEPT Provider Note   CSN: [id number] Arrival date & time: 04/10/21  2232     History Chief Complaint  Patient presents with   Abdominal Pain    [name] is a 50 y.o. male.  Patient with history Wolff-Parkinson-White, hypertension, stomach ulcers, chronic pancreatitis, cocaine abuse presenting with epigastric pain rating to his back similar to previous episodes of pancreatitis.  He endorses drinking alcohol last night and this morning and the pain has worsened.  He states he has chronic pain and never really goes away but became worse after drinking.  Has had nausea but no vomiting.  Several episodes of diarrhea 5-6 times daily that has been nonbloody.  No fever.  States the pain is in his epigastrium and radiates into his chest similar to previous episodes of pancreatitis.  He is uncertain if he could be having pain from his ulcers as well.  He admits to drinking alcohol.  He states he has not had no cough or fever.  No shortness of breath.  No pain with urination or blood in the urine.  Denies any cardiac history other than Wolff-Parkinson-White.  The history is provided by the patient.  Abdominal Pain Associated symptoms: diarrhea and nausea   Associated symptoms: no dysuria, no fever, no hematuria, no shortness of breath and no vomiting       Past Medical History:  Diagnosis Date   Alcoholism /alcohol abuse    Anemia    Anxiety    Arthritis    "knees; arms; elbows" (03/26/2015)   Asthma    Bipolar disorder (HCC)    Chronic bronchitis (HCC)    Chronic lower back pain    Chronic pancreatitis ([place])    Cocaine abuse ([place])    Depression    Family history of adverse reaction to anesthesia    "grandmother gets confused"   Femoral condyle fracture ([place]) 03/08/2014   left medial/notes 03/09/2014   GERD (gastroesophageal reflux disease)    H/O hiatal hernia    H/O suicide attempt 10/2012   High cholesterol    History of  blood transfusion 10/2012   "when I tried to commit suicide"   History of stomach ulcers    Hypertension    Marijuana abuse, continuous    Migraine    "a few times/year" (03/26/2015)   Pneumonia 1990's X 3   PTSD (post-traumatic stress disorder)    Sickle cell trait ([place])    WPW (Wolff-Parkinson-White syndrome)    [name] 03/06/2013    Patient Active Problem List   Diagnosis Date Noted   Urinary retention    Protein-calorie malnutrition, severe 11/17/2020   Acute pancreatitis 11/15/2020   Seizure (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/20/2016   Chronic pancreatitis ([place]) 05/18/2015   Essential hypertension 02/06/2014   Mood disorder ([place]) 02/06/2014   Pancreatic pseudocyst/cyst 11/25/2013   [name] Parkinson White pattern seen on electrocardiogram 10/03/2012   TOBACCO ABUSE 03/23/2007    Past Surgical History:  Procedure Laterality Date   BIOPSY  11/25/2017   Procedure: BIOPSY;  Surgeon: Arta Silence, MD;  Location: [facility];  Service: Endoscopy;;   BIOPSY  10/14/2018   Procedure: BIOPSY;  Surgeon: Arta Silence, MD;  Location: [facility];  Service: Endoscopy;;   CARDIAC CATHETERIZATION  CYST ENTEROSTOMY  01/02/2020   Procedure: CYST ASPIRATION;  Surgeon: Milus Banister, MD;  Location: [facility];  Service: Endoscopy;;   ESOPHAGOGASTRODUODENOSCOPY (EGD) WITH PROPOFOL N/A 11/25/2017   Procedure: ESOPHAGOGASTRODUODENOSCOPY (EGD) WITH PROPOFOL;  Surgeon: Arta Silence, MD;  Location: [facility];  Service: Endoscopy;  Laterality: N/A;   ESOPHAGOGASTRODUODENOSCOPY (EGD) WITH PROPOFOL Left 10/14/2018   Procedure: ESOPHAGOGASTRODUODENOSCOPY (EGD) WITH PROPOFOL;  Surgeon: Arta Silence, MD;  [facility]  ENDOSCOPY;  Service: Endoscopy;  Laterality: Left;   ESOPHAGOGASTRODUODENOSCOPY (EGD) WITH PROPOFOL N/A 11/14/2018   Procedure: ESOPHAGOGASTRODUODENOSCOPY (EGD) WITH PROPOFOL;  Surgeon: Laurence Spates, MD;  Location: [facility];  Service: Gastroenterology;  Laterality: N/A;   ESOPHAGOGASTRODUODENOSCOPY (EGD) WITH PROPOFOL N/A 01/02/2020   Procedure: ESOPHAGOGASTRODUODENOSCOPY (EGD) WITH PROPOFOL;  Surgeon: Milus Banister, MD;  [facility];  Service: Endoscopy;  Laterality: N/A;   ESOPHAGOGASTRODUODENOSCOPY (EGD) WITH PROPOFOL N/A 10/25/2020   Procedure: ESOPHAGOGASTRODUODENOSCOPY (EGD) WITH PROPOFOL;  Surgeon: Rush Landmark Telford Nab., MD;  Location: [facility];  Service: Gastroenterology;  Laterality: N/A;   EUS N/A 01/02/2020   Procedure: UPPER ENDOSCOPIC ULTRASOUND (EUS) RADIAL;  Surgeon: Milus Banister, MD;  Location: [facility];  Service: Endoscopy;  Laterality: N/A;   EYE SURGERY Left 1990's   "result of trauma"    FACIAL FRACTURE SURGERY Left 1990's   "result of trauma"    FLEXIBLE SIGMOIDOSCOPY N/A 10/25/2020   Procedure: FLEXIBLE SIGMOIDOSCOPY;  Surgeon: Irving Copas., MD;  Location: [facility];  Service: Gastroenterology;  Laterality: N/A;   FRACTURE SURGERY     HEMOSTASIS CLIP PLACEMENT  10/25/2020   Procedure: HEMOSTASIS CLIP PLACEMENT;  Surgeon: Irving Copas., MD;  Location: [facility];  Service: Gastroenterology;;   HERNIA REPAIR     LEFT HEART CATHETERIZATION WITH CORONARY ANGIOGRAM Right 03/07/2013   Procedure: LEFT HEART CATHETERIZATION WITH CORONARY ANGIOGRAM;  Surgeon: [name], MD;  Location: [facility] CATH LAB;  Service: Cardiovascular;  Laterality: Right;   UMBILICAL HERNIA REPAIR     UPPER GASTROINTESTINAL ENDOSCOPY         Family History  Problem Relation Age of Onset   Hypertension Mother    Cirrhosis Father    Alcoholism Father    Hypertension Father    Melanoma Father    Hypertension Other    Coronary artery disease  Other     Social History   Tobacco Use   Smoking status: Every Day    Packs/day: 1.00    Years: 36.00    Pack years: 36.00    Types: Cigarettes, E-cigarettes   Smokeless tobacco: Never  Vaping Use   Vaping Use: Former  Substance Use Topics   Alcohol use: Yes    Comment: last drink 8 days ago   Drug use: Yes    Types: Marijuana, Cocaine    Comment: daily marijuana use; last cocaine use about 3 months ago    Home Medications Prior to Admission medications   Medication Sig Start Date End Date Taking? Authorizing Provider  acetaminophen (TYLENOL) 500 MG tablet Take 500 mg by mouth every 6 (six) hours as needed for moderate pain or headache.    [provider]  albuterol (VENTOLIN HFA) 108 (90 Base) MCG/ACT inhaler Inhale 2 puffs into the lungs every 4 (four) hours as needed for wheezing or shortness of breath. 11/12/19   [name], DO  cholecalciferol (VITAMIN D) 25 MCG tablet Take 1 tablet (1,000 Units total) by mouth daily. 10/30/20   [name], MD  cyclobenzaprine (FLEXERIL) 10 MG tablet Take  10 mg by mouth 3 (three) times daily as needed for muscle spasms. 11/03/20   [provider]  fluticasone furoate-vilanterol (BREO ELLIPTA) 200-25 MCG/INH AEPB Inhale 1 puff into the lungs daily. Patient taking differently: Inhale 1 puff into the lungs daily as needed (asthma). 11/12/19   [name], DO  folic acid (FOLVITE) 1 MG tablet Take 1 tablet (1 mg total) by mouth daily. 11/26/17   [name], MD  lidocaine (XYLOCAINE) 2 % solution Use as directed 15 mLs in the mouth or throat every 6 (six) hours as needed for mouth pain. 11/21/20   [name], [name], DO  lipase/protease/amylase (CREON) 36000 UNITS CPEP capsule Take 2 capsules (72,000 Units total) by mouth 3 (three) times daily with meals. For pancreatitis 11/05/19   [name], [name], MD  magnesium oxide (MAG-OX) 400 (240 Mg) MG tablet Take 400 mg by mouth daily. 12/24/20   [provider]   metoprolol (TOPROL-XL) 200 MG 24 hr tablet Take 200 mg by mouth daily. 10/29/20   [provider]  Multiple Vitamin (MULTIVITAMIN WITH MINERALS) TABS tablet Take 1 tablet by mouth daily. 09/06/17   Bonnell Public, MD  omeprazole (PRILOSEC) 40 MG capsule Take 40 mg by mouth 2 (two) times daily as needed (heartburn). 02/16/21   [provider]  ondansetron (ZOFRAN-ODT) 4 MG disintegrating tablet Take 1 tablet (4 mg total) by mouth 2 (two) times daily as needed for vomiting or nausea. 02/24/21   Domenic Moras, PA-C  OVER THE COUNTER MEDICATION Take 1 tablet by mouth daily. Iron supplement.    [provider]  pantoprazole (PROTONIX) 40 MG tablet Take 1 tablet (40 mg total) by mouth daily. Patient not taking: Reported on 03/24/2021 11/21/20 12/21/20  [name], [name], DO  Polyethyl Glycol-Propyl Glycol (SYSTANE OP) Place 1 drop into both eyes daily as needed (dry eyes).    [provider]  potassium chloride (KLOR-CON) 10 MEQ tablet Take 10 mEq by mouth daily as needed (low potassium).    [provider]  QUEtiapine (SEROQUEL) 50 MG tablet Take 50 mg by mouth at bedtime.    [provider]  sertraline (ZOLOFT) 100 MG tablet Take 1 tablet (100 mg total) by mouth daily. 03/27/20   [name], MD  sucralfate (CARAFATE) 1 g tablet Take 1 tablet (1 g total) by mouth 4 (four) times daily -  with meals and at bedtime. 05/23/20   Kerin Perna, NP  thiamine 100 MG tablet Take 1 tablet (100 mg total) by mouth daily. 10/16/18   [name], MD  verapamil (CALAN-SR) 120 MG CR tablet Take 120 mg by mouth at bedtime. 12/24/20   [provider]  Vitamin D, Ergocalciferol, (DRISDOL) 1.25 MG (50000 UNIT) CAPS capsule Take 50,000 Units by mouth every Wednesday.    [provider]  amitriptyline (ELAVIL) 25 MG tablet Take 1 tablet (25 mg total) by mouth at bedtime. Patient not taking: Reported on 08/08/2019 10/15/18 08/08/19  [name], MD   gabapentin (NEURONTIN) 100 MG capsule Take 1 capsule (100 mg total) by mouth 2 (two) times daily. Patient not taking: Reported on 08/08/2019 10/15/18 08/08/19  [name], MD  promethazine (PHENERGAN) 25 MG tablet Take 1 tablet (25 mg total) by mouth every 6 (six) hours as needed for nausea or vomiting. Patient not taking: Reported on 08/08/2019 03/02/19 08/08/19  Kinnie Feil, PA-C    Allergies    Robaxin [methocarbamol], Aspirin, Shellfish-derived products, Trazodone and nefazodone,  Adhesive [tape], Latex, Toradol [ketorolac tromethamine], Contrast media [iodinated diagnostic agents], and Reglan [metoclopramide]  Review of Systems   Review of Systems  Constitutional:  Positive for activity change and appetite change. Negative for fever.  HENT:  Negative for congestion and rhinorrhea.   Respiratory:  Positive for chest tightness. Negative for shortness of breath.   Gastrointestinal:  Positive for abdominal pain, diarrhea and nausea. Negative for vomiting.  Genitourinary:  Negative for dysuria and hematuria.  Musculoskeletal:  Negative for arthralgias, back pain and myalgias.  Skin:  Negative for rash.  Neurological:  Negative for dizziness, weakness and headaches.   all other systems are negative except as noted in the HPI and PMH.   Physical Exam Updated Vital Signs BP (!) 158/98 (BP Location: Left Arm)   Pulse 88   Temp 98.1 F (36.7 C) (Oral)   Resp 19   Ht 5\' 8"  (1.727 m)   Wt 54.4 kg   SpO2 100%   BMI 18.25 kg/m   Physical Exam Vitals and nursing note reviewed.  Constitutional:      General: He is not in acute distress.    Appearance: He is well-developed.  HENT:     Head: Normocephalic and atraumatic.     Mouth/Throat:     Pharynx: No oropharyngeal exudate.  Eyes:     Conjunctiva/sclera: Conjunctivae normal.     Pupils: Pupils are equal, round, and reactive to light.  Neck:     Comments: No meningismus. Cardiovascular:     Rate and Rhythm: Normal rate and  regular rhythm.     Heart sounds: Normal heart sounds. No murmur heard. Pulmonary:     Effort: Pulmonary effort is normal. No respiratory distress.     Breath sounds: Normal breath sounds.  Abdominal:     Palpations: Abdomen is soft.     Tenderness: There is abdominal tenderness. There is no guarding or rebound.     Comments: Epigastric tenderness, no guarding or rebound.  Equal femoral pulses, equal DP and PT pulses  Genitourinary:    Comments: No gross blood Musculoskeletal:        General: No tenderness. Normal range of motion.     Cervical back: Normal range of motion and neck supple.  Skin:    General: Skin is warm.  Neurological:     Mental Status: He is alert and oriented to person, place, and time.     Cranial Nerves: No cranial nerve deficit.     Motor: No abnormal muscle tone.     Coordination: Coordination normal.     Comments:  5/5 strength throughout. CN 2-12 intact.Equal grip strength.   Psychiatric:        Behavior: Behavior normal.    ED Results / Procedures / Treatments   Labs (all labs ordered are listed, but only abnormal results are displayed) Labs Reviewed  COMPREHENSIVE METABOLIC PANEL - Abnormal; Notable for the following components:      Result Value   Sodium 133 (*)    CO2 16 (*)    Glucose, Bld 57 (*)    Creatinine, Ser 1.35 (*)    Calcium 7.9 (*)    Total Protein 8.2 (*)    AST 75 (*)    ALT 88 (*)    Total Bilirubin 1.3 (*)    All other components within normal limits  CBC - Abnormal; Notable for the following components:   RBC 3.69 (*)    Hemoglobin 11.7 (*)    HCT 36.0 (*)  All other components within normal limits  RAPID URINE DRUG SCREEN, HOSP PERFORMED - Abnormal; Notable for the following components:   Cocaine POSITIVE (*)    Tetrahydrocannabinol POSITIVE (*)    All other components within normal limits  I-STAT CHEM 8, ED - Abnormal; Notable for the following components:   Glucose, Bld 69 (*)    Calcium, Ion 0.99 (*)    TCO2 18  (*)    All other components within normal limits  LIPASE, BLOOD  ETHANOL  URINALYSIS, ROUTINE W REFLEX MICROSCOPIC  POC OCCULT BLOOD, ED  TROPONIN I (HIGH SENSITIVITY)  TROPONIN I (HIGH SENSITIVITY)    EKG EKG Interpretation  Date/Time:  Saturday April 10 2021 23:00:50 EST Ventricular Rate:  84 PR Interval:  108 QRS Duration: 77 QT Interval:  363 QTC Calculation: 430 R Axis:   74 Text Interpretation: Sinus rhythm Short PR interval Biatrial enlargement Abnormal R-wave progression, early transition Abnrm T, consider ischemia, anterolateral lds T wave inversions Confirmed by Ezequiel Essex [phone]) on 04/11/2021 12:42:31 AM  Radiology CT ABDOMEN PELVIS WO CONTRAST  Result Date: 04/11/2021 CLINICAL DATA:  abdominal pain with history of pancreatitis. EXAM: CT ABDOMEN AND PELVIS WITHOUT CONTRAST TECHNIQUE: Multidetector CT imaging of the abdomen and pelvis was performed following the standard protocol without IV contrast. COMPARISON:  CT without contrast 12/02/2020, 10/30/2020 FINDINGS: Lower chest: No acute abnormality. Hepatobiliary: No focal liver abnormality is seen. No gallstones, gallbladder wall thickening, or biliary dilatation. Pancreas: Coarse chronic pancreatic calcifications consistent with chronic calcific pancreatitis are again noted. There is trace stranding along the distal body and tail of the pancreas but no other peripancreatic stranding is seen. The peripancreatic edema was greater previously. 1.8 cm cystic lesion in the proximal pancreatic tail was previously 2.0 cm. 1.3 cm cystic lesion abutting the distal tail previously measured 1.6 cm. There is mild ductal dilatation in the tail which has also improved. Spleen: Normal size and attenuation. Adrenals/Urinary Tract: Unremarkable unenhanced adrenal glands and kidneys. No hydronephrosis, stone or bladder mass without contrast. Stomach/Bowel: Chronic circumferential gastric thickening. There is fluid filling of small bowel  without dilatation, fluid in the ascending colon. Colonic diverticula without evidence of diverticulitis or small bowel inflammation. Normal appendix. Vascular/Lymphatic: Aortic atherosclerosis. No enlarged abdominal or pelvic lymph nodes. Reproductive: Normal prostate size. Other: No free air, hemorrhage or fluid. Musculoskeletal: Degenerative disc disease, spondylosis and circumferential bulging disc, L5-S1. No worrisome regional skeletal lesion. IMPRESSION: 1. Chronic calcific pancreatitis, today with only trace stranding at the tail of pancreas, with the peripancreatic edema more pronounced on both prior studies and with decreased size of presumed pseudocysts. 2. Chronically thickened stomach, and today with fluid filling of normal caliber small bowel and ascending colon which could be due to an ileus or nonspecific enteritis. There is no bowel obstruction or inflammation. Diverticulosis. Electronically Signed   By: Telford Nab M.D.   On: 04/11/2021 03:47    Procedures Procedures   Medications Ordered in ED Medications  sodium chloride 0.9 % bolus 1,000 mL (1,000 mLs Intravenous New Bag/Given 04/11/21 0050)  ondansetron (ZOFRAN) injection 4 mg (4 mg Intravenous Given 04/11/21 0049)  HYDROmorphone (DILAUDID) injection 1 mg (1 mg Intravenous Given 04/11/21 0050)  alum & mag hydroxide-simeth (MAALOX/MYLANTA) 200-200-20 MG/5ML suspension 30 mL (30 mLs Oral Given 04/11/21 0055)    And  lidocaine (XYLOCAINE) 2 % viscous mouth solution 15 mL (15 mLs Oral Given 04/11/21 0055)    ED Course  I have reviewed the triage vital signs and the nursing  notes.  Pertinent labs & imaging results that were available during my care of the patient were reviewed by me and considered in my medical decision making (see chart for details).    MDM Rules/Calculators/A&P                          Acute on chronic abdominal pain with history of chronic pancreatitis.  Nausea but no vomiting.  Vital stable, no  distress.  EKG today shows diffuse T wave inversions are new since last EKG but were present previously in June and July 2022  Work-up reassuring.  Troponin is negative.  LFTs and lipase are stable.  Hemoglobin is stable.  Hemoccult is negative.  No vomiting witnessed in the ED.  CT scan today shows improving chronic pancreatitis as well as gastric thickening which is chronic. No obstruction.  T wave inversions not seen on most recent EKG but present on priors in July and September 2022.  Pain positive again.  T wave inversions discussed with Dr. Renella Cunas of cardiology who agrees there is similar to previous EKGs and likely due to his cocaine abuse.  Troponins today remain negative with low suspicion for ACS.  Patient describes epigastric pain similar to previous episodes of pancreatitis.  Patient tolerating p.o.  Abdomen is soft.  Sleeping on recheck.  Bicarb is improving after hydration.  Discussed with patient to avoid alcohol, cocaine, marijuana and other drugs. Follow-up with his primary doctor.  Return to the ED with new or worsening symptoms. Final Clinical Impression(s) / ED Diagnoses Final diagnoses:  Chronic abdominal pain  Cocaine abuse ([place])  Chronic pancreatitis, unspecified pancreatitis type [facility])    Rx / DC Orders ED Discharge Orders     None        Anterrio Mccleery, Annie Main, MD 04/11/21 [phone]

## 2021-04-16 ENCOUNTER — Other Ambulatory Visit: Payer: Self-pay | Admitting: Gastroenterology

## 2021-04-19 NOTE — Telephone Encounter (Signed)
Left message for patient to call back and let us know which PPI he is taking.  Omeprazole and pantoprazole are both on his list.  Neither have been sent by Dr. Havery Moros or Dr. Rush Landmark.  He should be on a PPI per EGD report but we need to identify which he is taking, making sure he is not taking both and update his medication

## 2021-04-20 ENCOUNTER — Other Ambulatory Visit: Payer: Self-pay

## 2021-04-20 ENCOUNTER — Telehealth: Payer: Self-pay | Admitting: Gastroenterology

## 2021-04-20 ENCOUNTER — Other Ambulatory Visit: Payer: Self-pay | Admitting: Gastroenterology

## 2021-04-20 NOTE — Telephone Encounter (Signed)
Thanks Jan. Okay to use sucralfate suspension. Otherwise he needs to talk with his primary care about the oxycodone if he needs to use that longer term, I don't prescribe that medication

## 2021-04-20 NOTE — Telephone Encounter (Signed)
Inbound call from [facility] from [facility] stating sucralfate 1gram is on back order and is requesting a change.  Please advise.

## 2021-04-20 NOTE — Telephone Encounter (Signed)
Patient was in the ED last weekend for abdominal pain, cocaine abuse and chronic pancreatitis. He admitted to have been drinking alcohol. CT scan showed "improving chronic pancreatitis as well as gastric thickening which is chronic. No obstruction". He was treated and instructed to "avoid alcohol, cocaine, marijuana and other drugs" and Follow-up with his primary doctor. Patient's pharmacist called to let us know that Carafate tablets are on back order.  Ok to send script for sucralfate suspension?  Patient is also asking for Oxycodone prescription. Please advise

## 2021-04-20 NOTE — Telephone Encounter (Signed)
Patient called states [facility] only has the Carafate in liquid form. He also requested a refill on Oxycodone that was given to him at the hospital..

## 2021-04-21 MED ORDER — SUCRALFATE 1 GM/10ML PO SUSP: 1.0000 g | Freq: Three times a day (TID) | ORAL | 1 refills | Status: DC

## 2021-04-21 NOTE — Telephone Encounter (Signed)
Sent new script for suspension to the pharmacy. Called pharmacy and let them know about script for suspension sucralfate and that dr. Havery Moros does not prescribe oxycodone and he will need to discuss with his PCP.

## 2021-04-22 ENCOUNTER — Ambulatory Visit: Payer: 59 | Admitting: Gastroenterology

## 2021-04-23 ENCOUNTER — Telehealth: Payer: Self-pay

## 2021-04-23 NOTE — Telephone Encounter (Signed)
We received a fax from [place] saying the sucralfate suspension has been denied. The tablets are back ordered.  I have filled out an appeal form and faxed it to: [phone].

## 2021-04-27 NOTE — Telephone Encounter (Signed)
Appeal for sucralfate suspension (due to tablets being on national backorder) has been approved 04-27-21 thru 07-25-21.  Called and let the pharmacy know to fill the suspension and let patient know. Appeal Ref [place] [facility] [address]. Plan Code:[id number]. [phone]

## 2021-05-05 IMAGING — CT CT HEAD WITHOUT CONTRAST
3 series · 15 of 47 positions shown, 18 images · non-contrast
Comparison: CT head without contrast 03/09/2014

CLINICAL DATA: Seizure, new, abnormal neuro exam.  Confusion.

EXAM:
CT HEAD WITHOUT CONTRAST
TECHNIQUE: Contiguous axial images were obtained from the base of the skull
through the vertex without intravenous contrast.

[Series 2: head wo · axial · 0.47mm/px · z∈[-91,+44]mm · 9 of 33 slices shown, 12 images]
[im 3/33  brain]
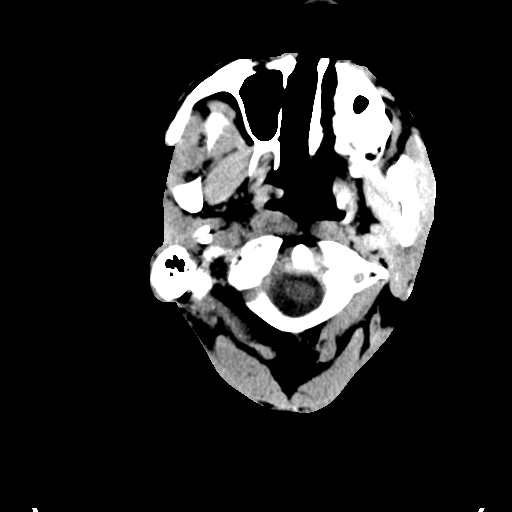
[im 3/33  bone]
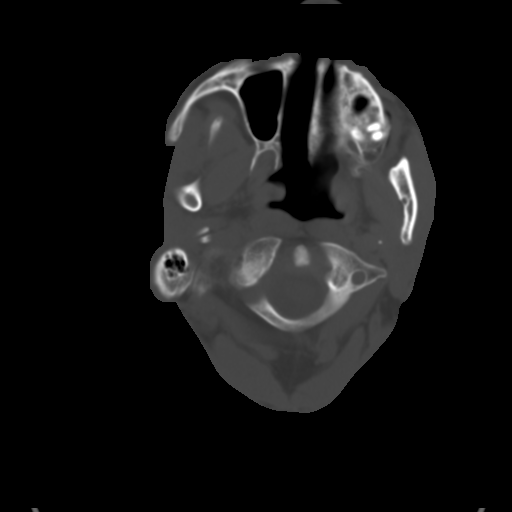
[im 6/33  brain]
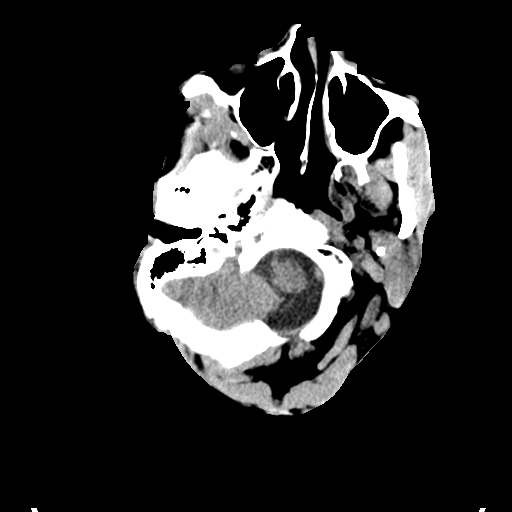
[im 9/33  brain]
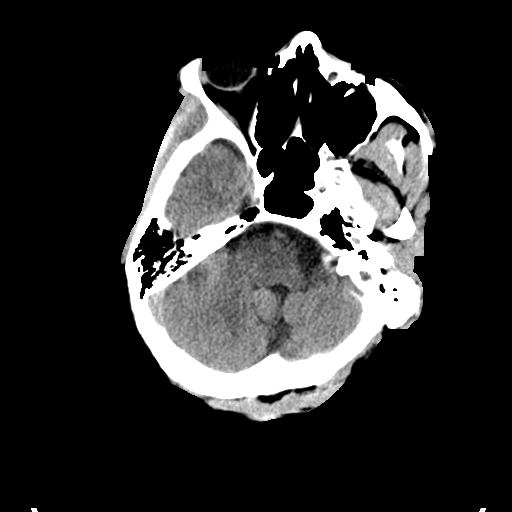
[im 13/33  brain]
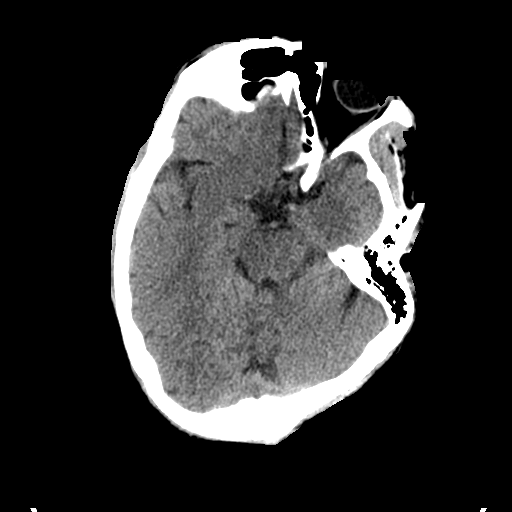
[im 17/33  brain]
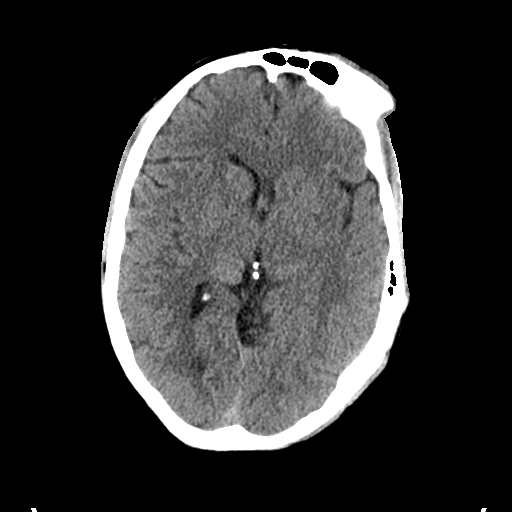
[im 17/33  bone]
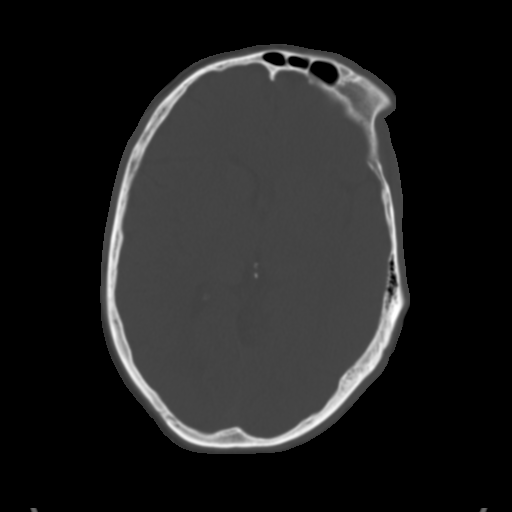
[im 20/33  brain]
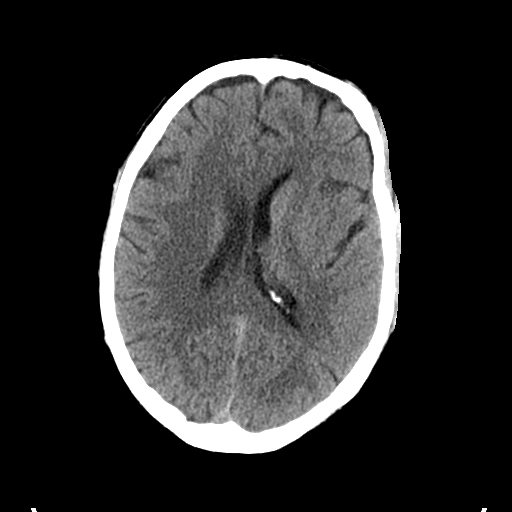
[im 24/33  brain]
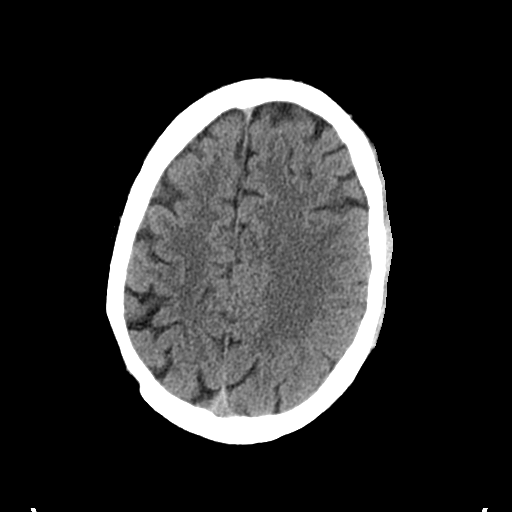
[im 27/33  brain]
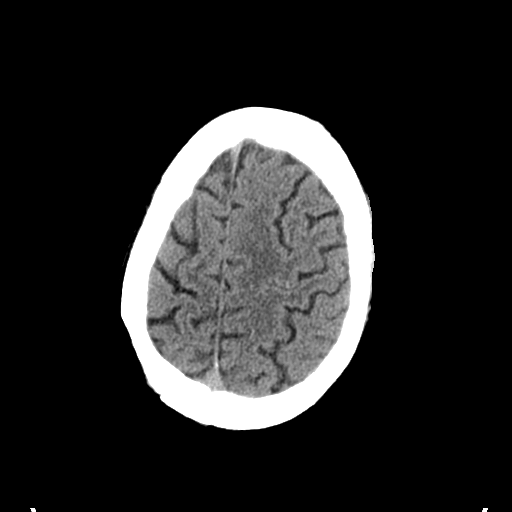
[im 30/33  brain]
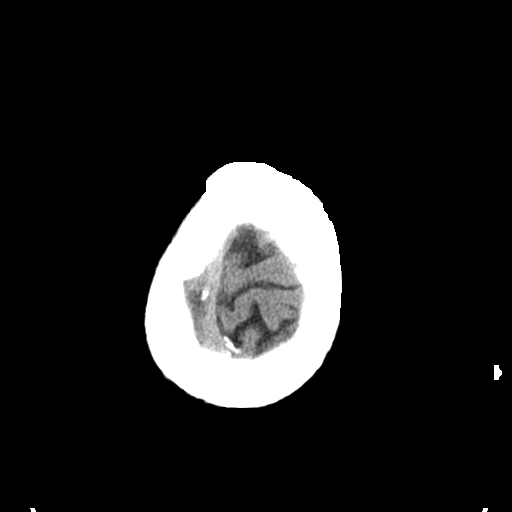
[im 30/33  bone]
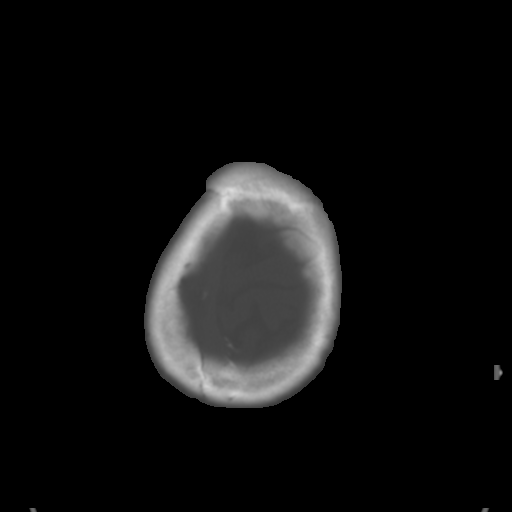

[Series 4: coronal soft tissue · coronal · 0.31mm/px · 3 of 66 slices shown]
[im 22/66  brain]
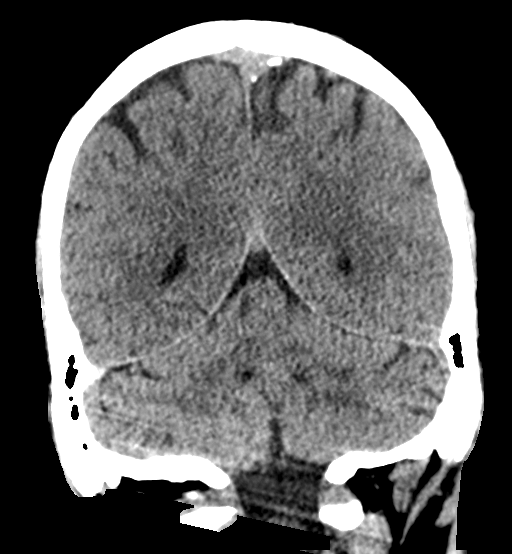
[im 29/66  brain]
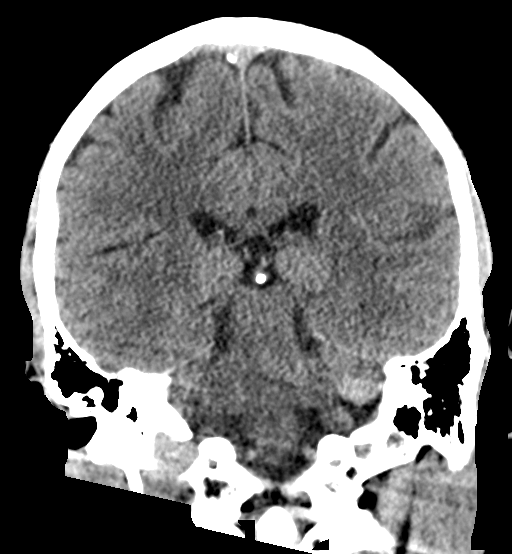
[im 37/66  brain]
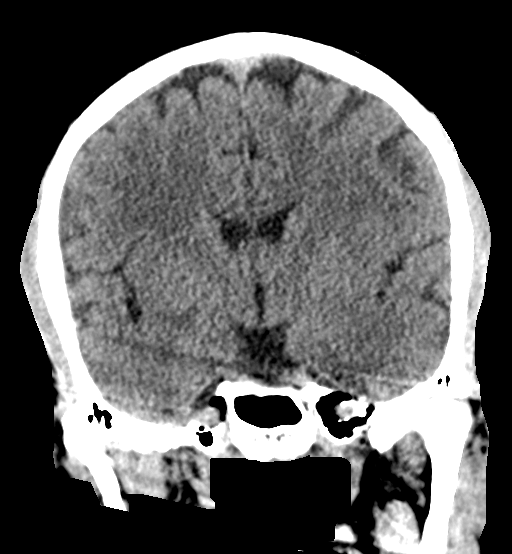

[Series 5: sagittal soft tissue · sagittal · 0.31mm/px · 3 of 50 slices shown]
[im 20/50  brain]
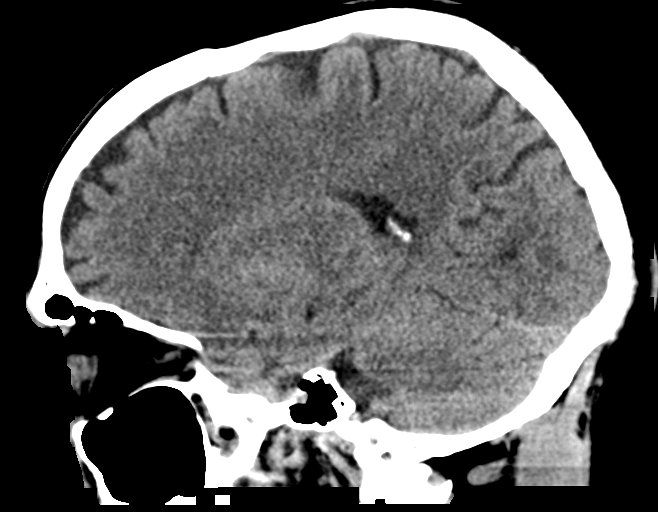
[im 25/50  brain]
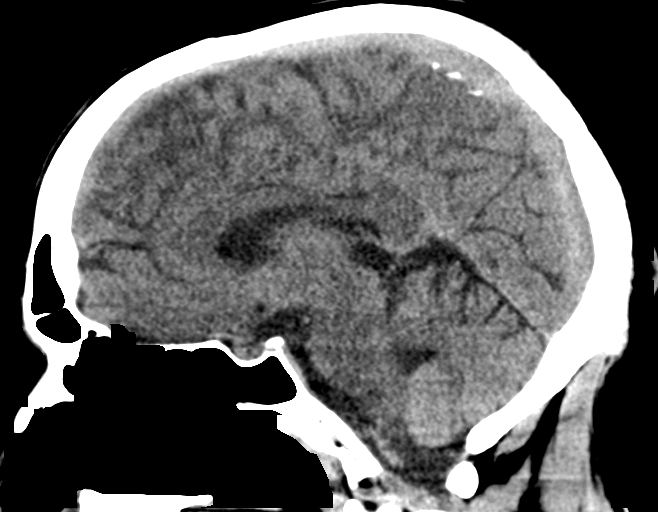
[im 31/50  brain]
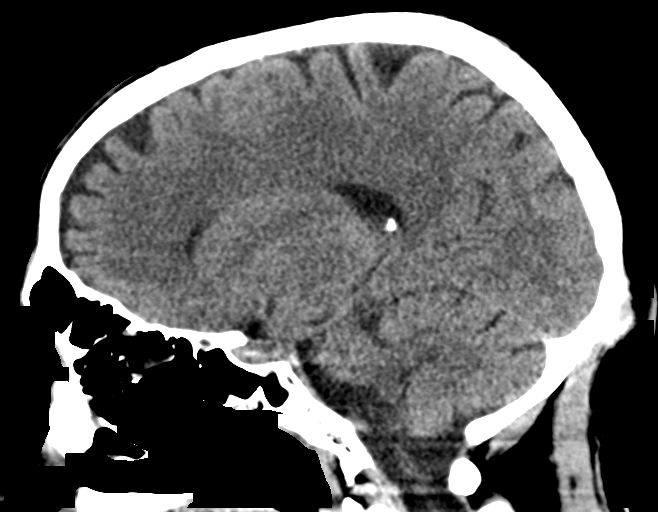

[15 of 47 positions shown; findings below may reference images not displayed]

FINDINGS: Brain: No acute infarct, hemorrhage, or mass lesion is present. No
significant white matter lesions are present. The ventricles are of
normal size. No significant extraaxial fluid collection is present.
The brainstem and cerebellum are within normal limits.

Vascular: No hyperdense vessel or unexpected calcification.

Skull: Postsurgical changes of the high right parietal lobe are
again noted. Calvarium is otherwise intact. No focal lytic or
blastic lesions are present.

Sinuses/Orbits: Posttraumatic changes and repair of the right orbit
are noted. No acute abnormalities are present. The paranasal sinuses
and mastoid air cells are clear.
IMPRESSION: 1. Normal CT appearance of the brain. No acute abnormalities to
explain seizure or altered mental status.
2. Chronic posttraumatic changes of the left orbit with surgical
repair.

## 2021-05-05 IMAGING — CR CHEST - 2 VIEW
2 series · 2 of 2 positions shown · non-contrast
Comparison: July 09, 2018

CLINICAL DATA: Chest pain.

EXAM:
CHEST - 2 VIEW

[w chest lat]
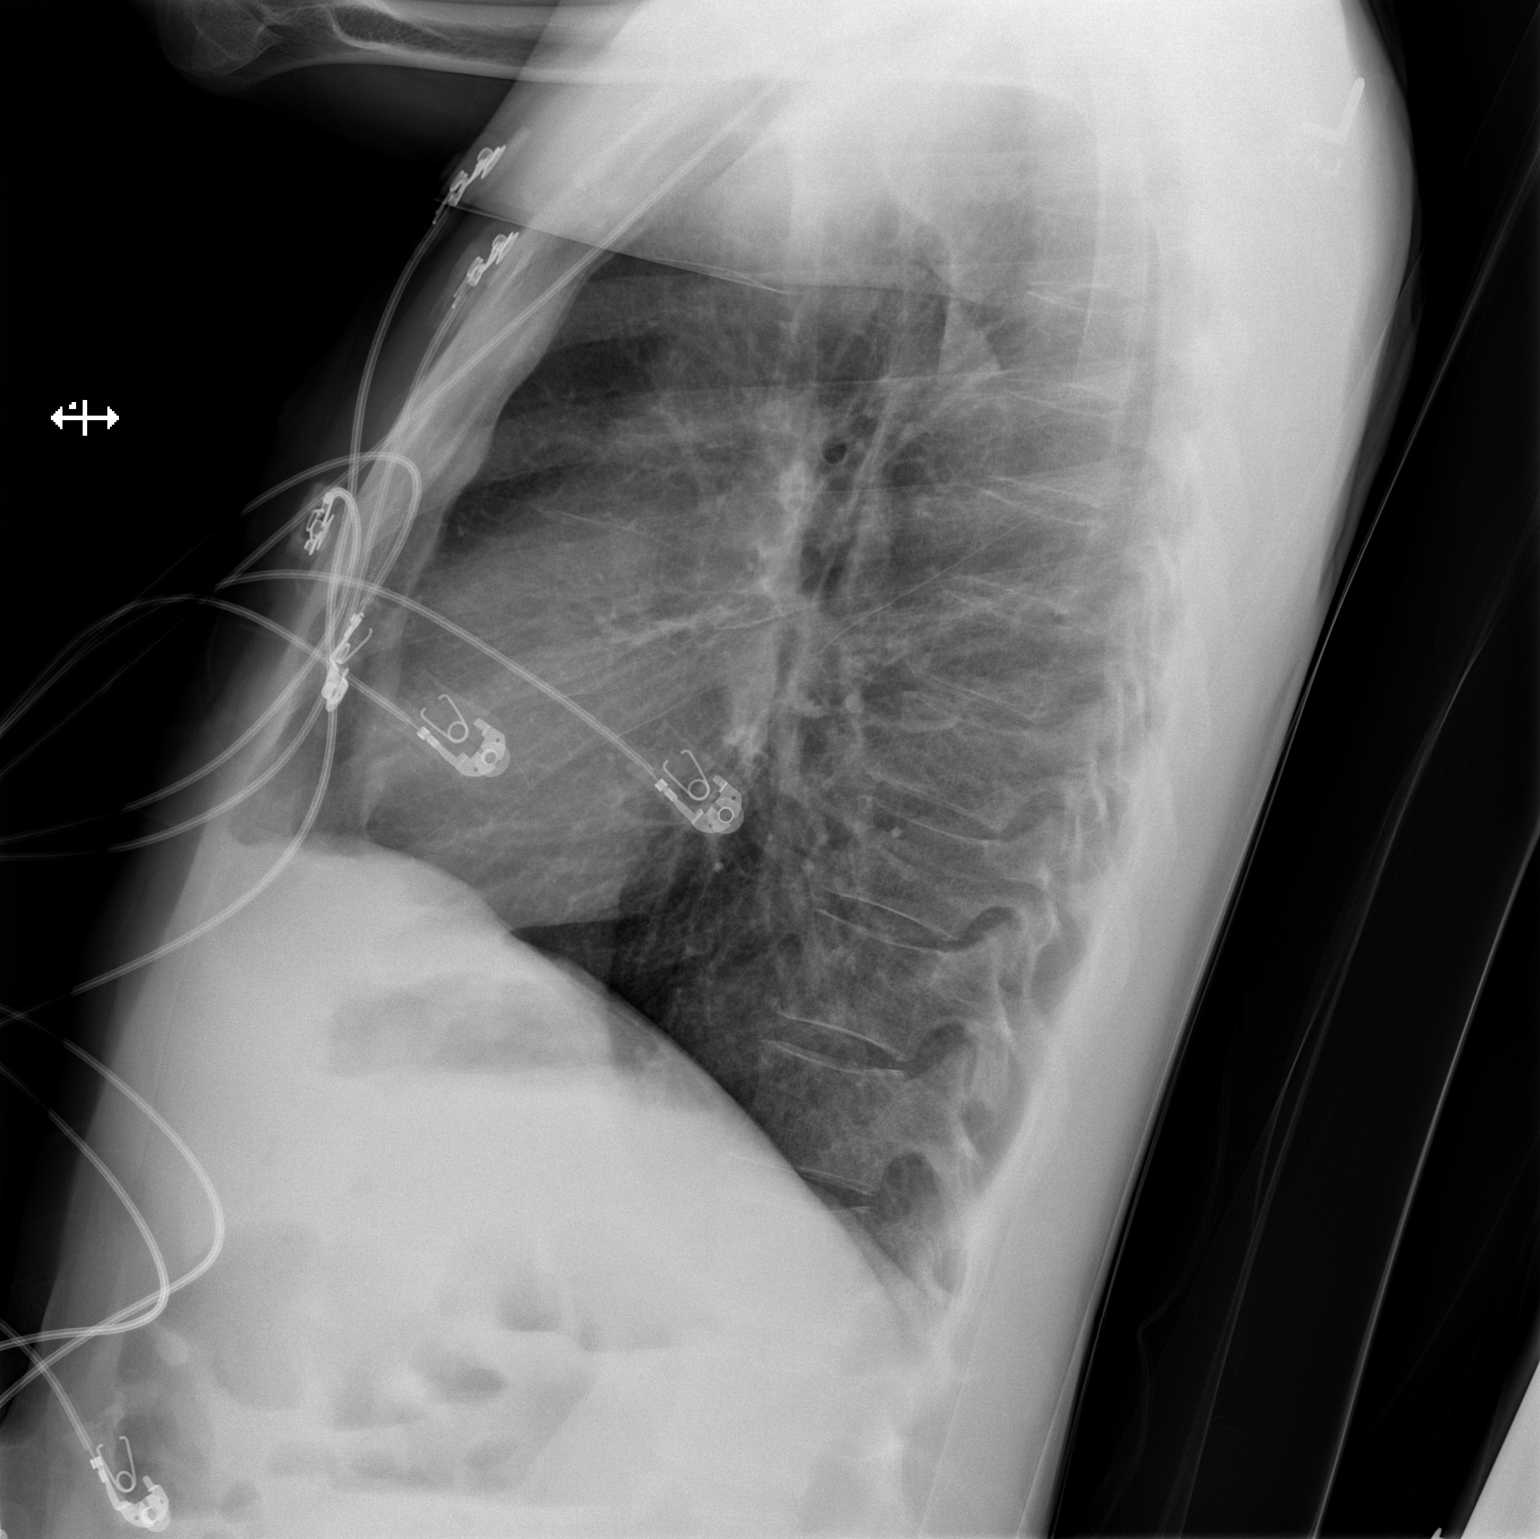

[x chest ap]
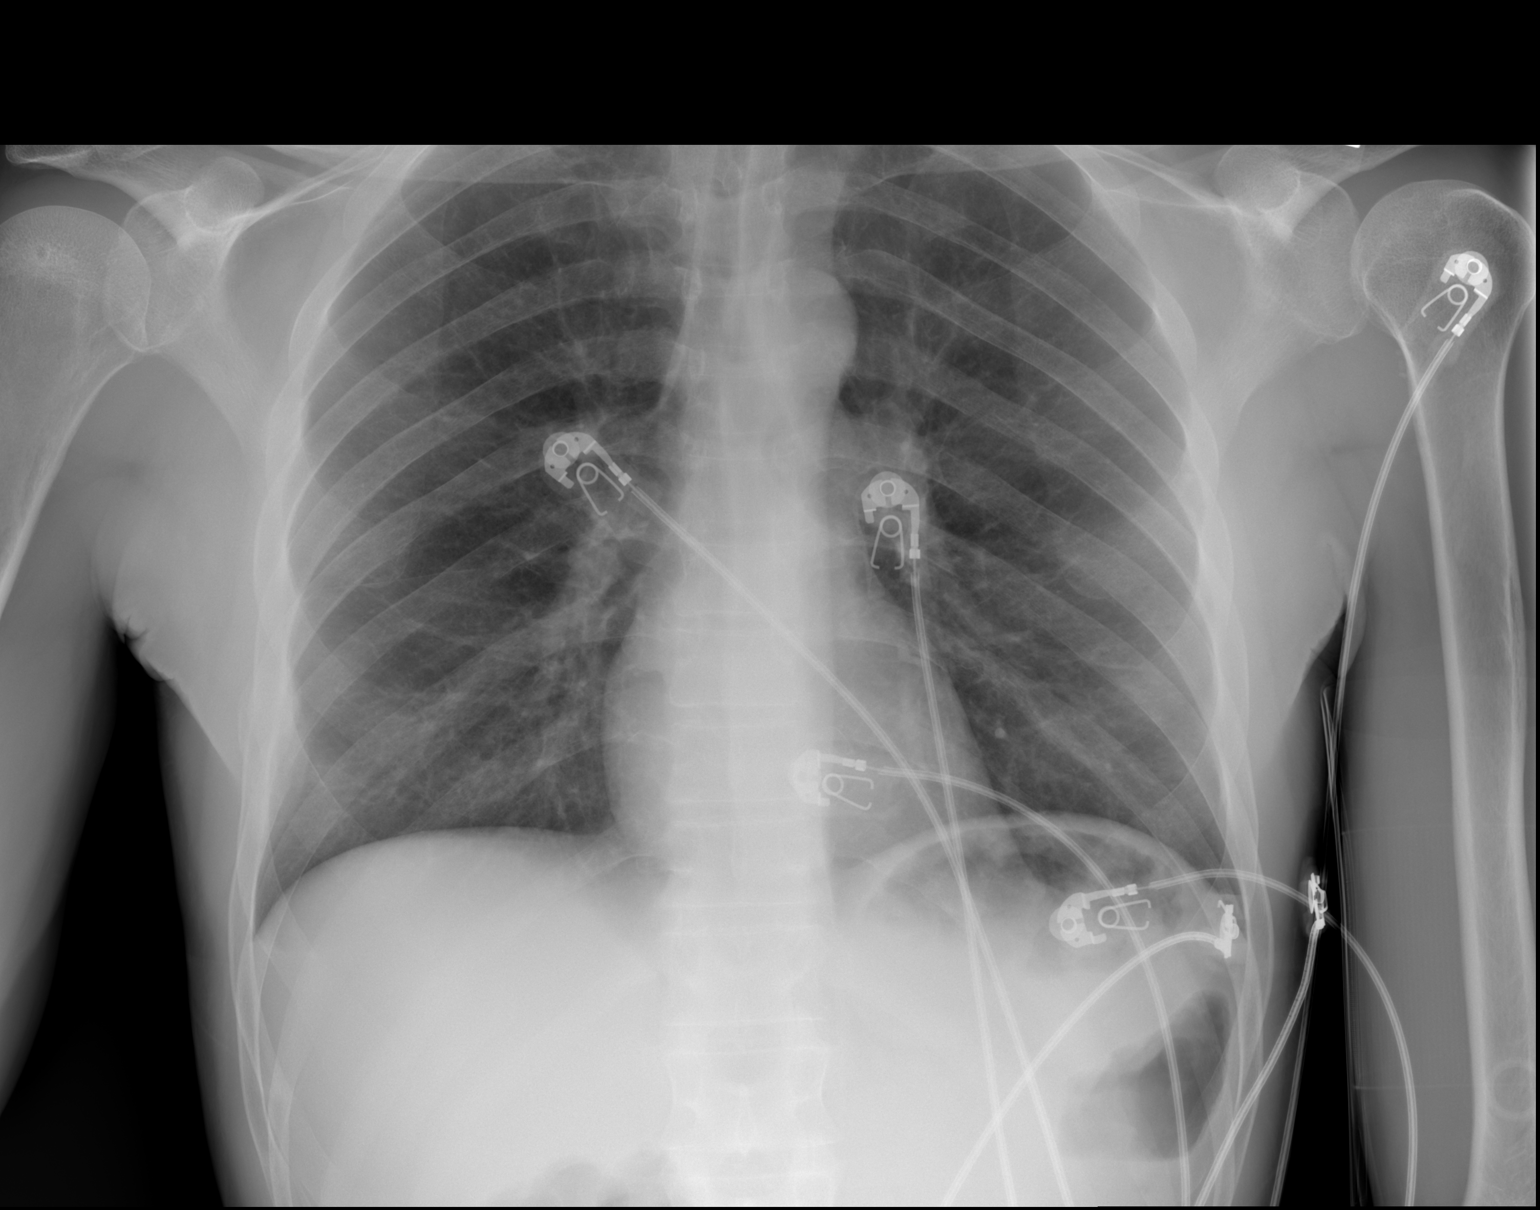

[2 of 2 positions shown; findings below may reference images not displayed]

FINDINGS: The previously demonstrated right middle lobe pneumonia has
substantially improved. There are a few chronic appearing airspace
opacities bilaterally. There is no pneumothorax. No large pleural
effusion. There is no acute osseous abnormality. The heart size is
normal.
IMPRESSION: No active cardiopulmonary disease.

## 2021-05-09 IMAGING — DX PORTABLE CHEST - 1 VIEW
1 series · 1 of 1 positions shown · non-contrast
Comparison: 11/13/2018

CLINICAL DATA: Chest pain and hematemesis

EXAM:
PORTABLE CHEST 1 VIEW

[chest]
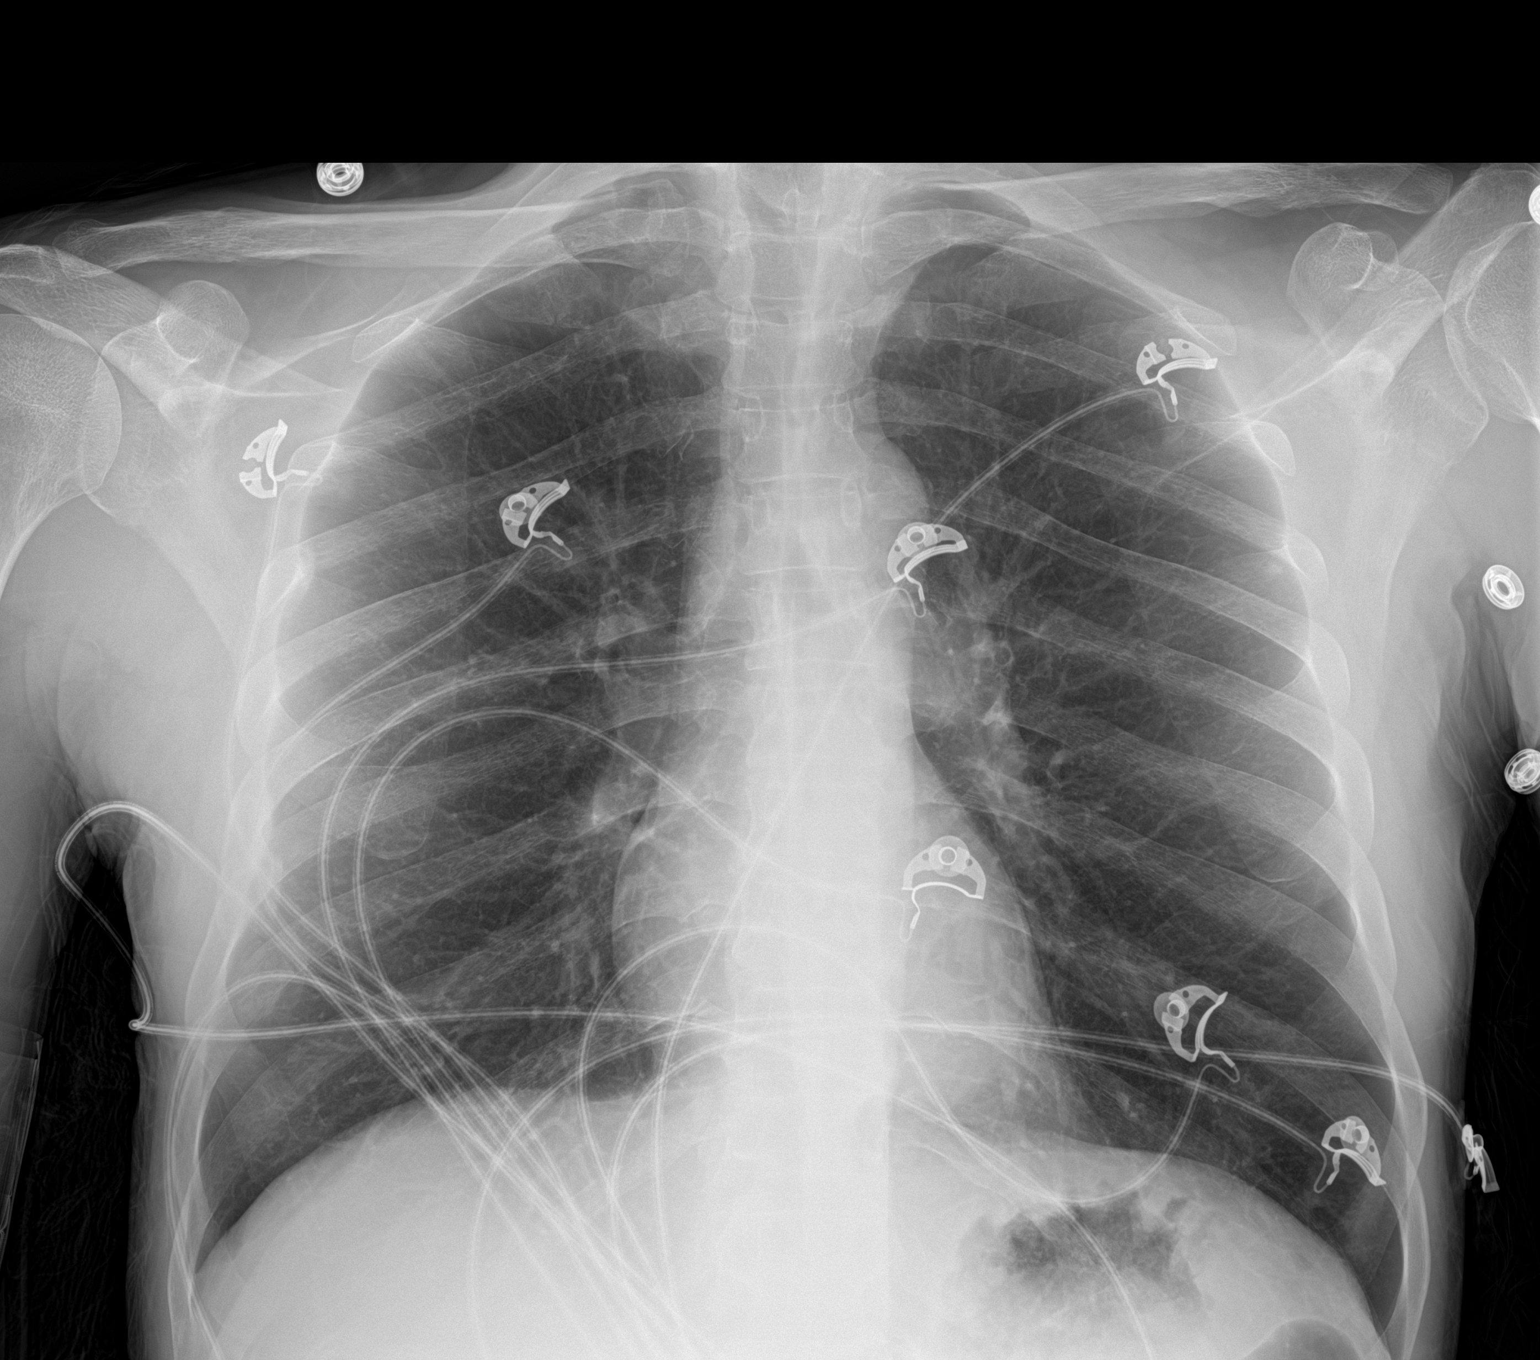

[1 of 1 positions shown; findings below may reference images not displayed]

FINDINGS: Cardiac shadow is within normal limits. The lungs are well aerated
bilaterally. No focal infiltrate or sizable effusion is seen. No
acute bony abnormality is noted.
IMPRESSION: No active disease.

## 2021-05-16 IMAGING — DX PORTABLE CHEST - 1 VIEW
1 series · 1 of 1 positions shown · non-contrast
Comparison: 11/17/2018 and earlier.

CLINICAL DATA: 48-year-old presenting with acute onset of chest
pain, generalized abdominal pain and hematemesis.

EXAM:
PORTABLE CHEST 1 VIEW

[chest ap]
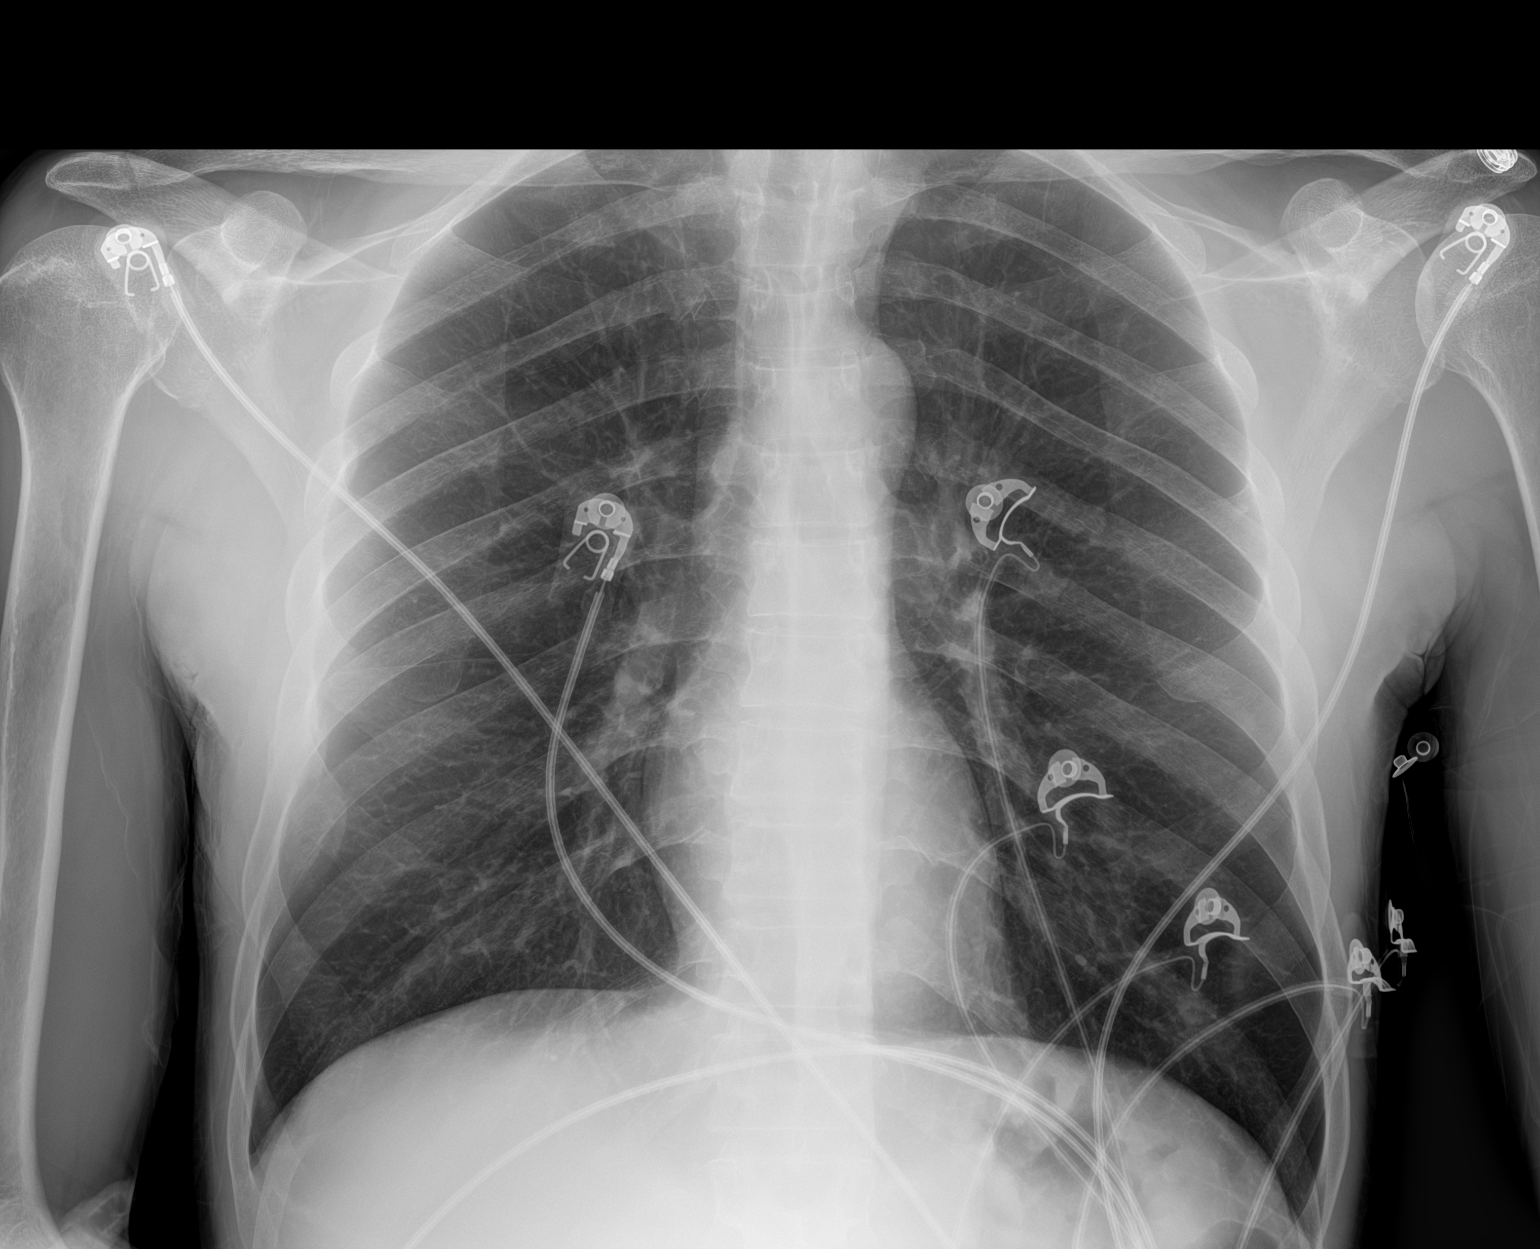

[1 of 1 positions shown; findings below may reference images not displayed]

FINDINGS: Cardiomediastinal silhouette unremarkable and unchanged. Stable
hyperinflation. Lungs clear. Bronchovascular markings normal.
Pulmonary vascularity normal. No visible pleural effusions. No
pneumothorax. No interval change.
IMPRESSION: Stable hyperinflation indicating COPD and/or asthma. No acute
cardiopulmonary disease.

## 2021-05-16 IMAGING — CT CT ABDOMEN AND PELVIS WITHOUT CONTRAST
2 of 4 series · 16 of 46 positions shown, 18 images · non-contrast
Comparison: CT scan of the abdomen and pelvis 10/14/2018

CLINICAL DATA: 48-year-old male with epigastric pain, nausea and
vomiting

EXAM:
CT ABDOMEN AND PELVIS WITHOUT CONTRAST
TECHNIQUE: Multidetector CT imaging of the abdomen and pelvis was performed
following the standard protocol without IV contrast.

[Series 3: ap without · axial · non-contrast · 0.56mm/px · z∈[-1279,-889]mm · 13 of 88 slices shown, 15 images]
[im 5/88  soft-tissue]
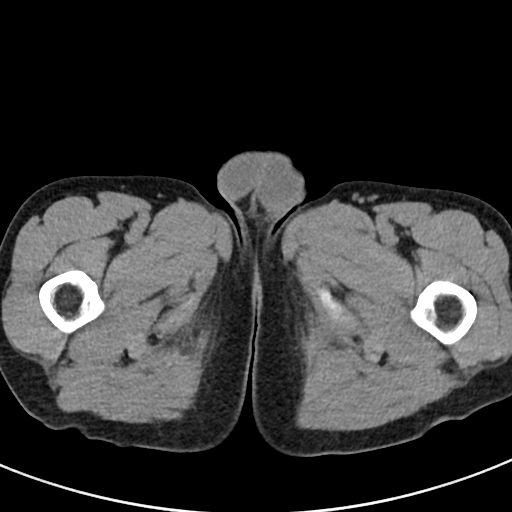
[im 5/88  bone]
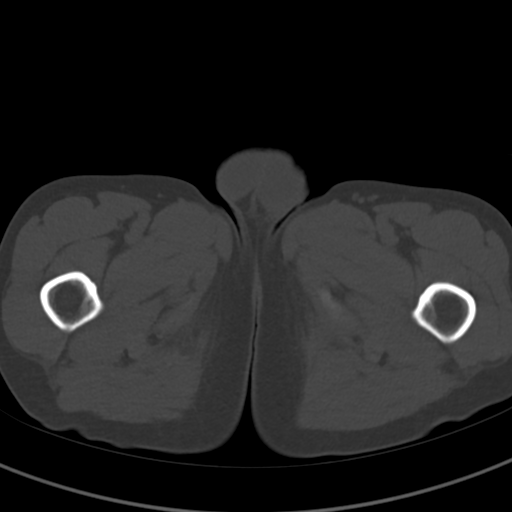
[im 10/88  soft-tissue]
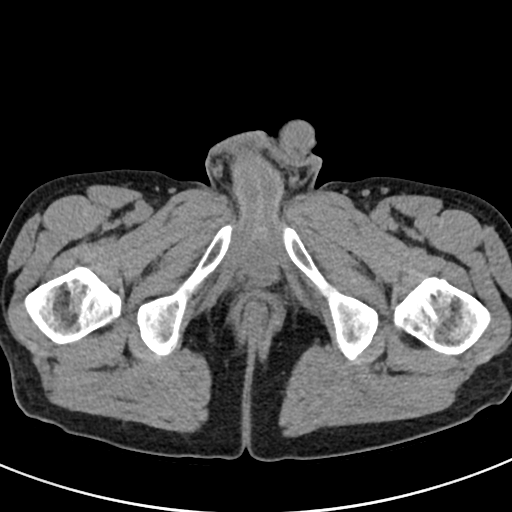
[im 20/88  soft-tissue]
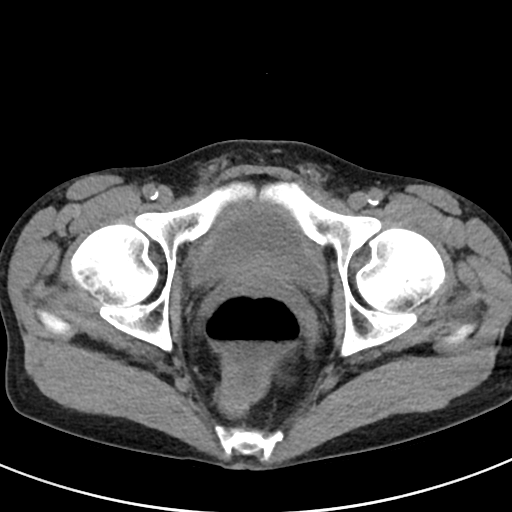
[im 25/88  soft-tissue]
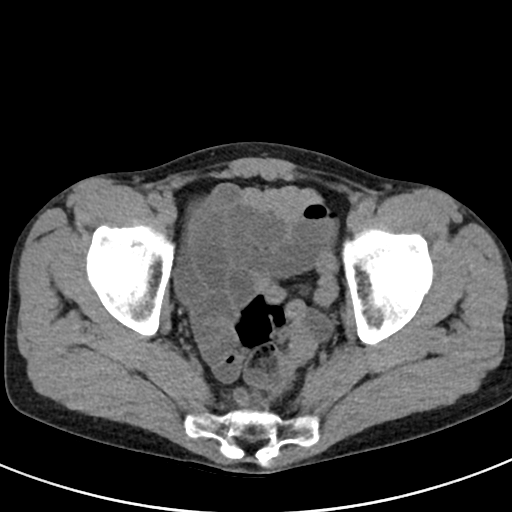
[im 30/88  soft-tissue]
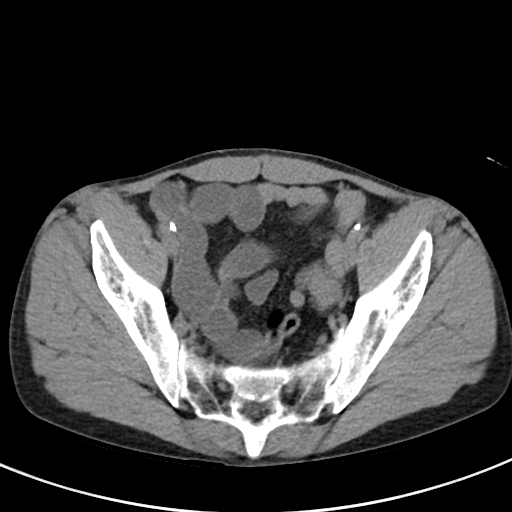
[im 39/88  soft-tissue]
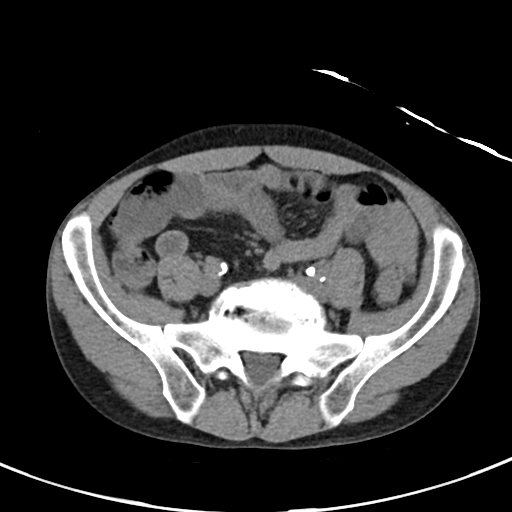
[im 44/88  soft-tissue]
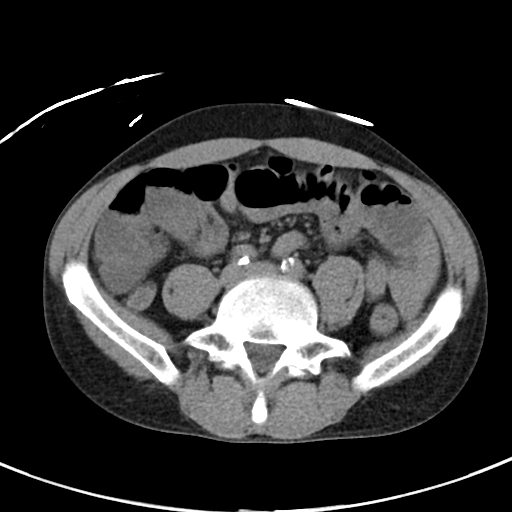
[im 49/88  soft-tissue]
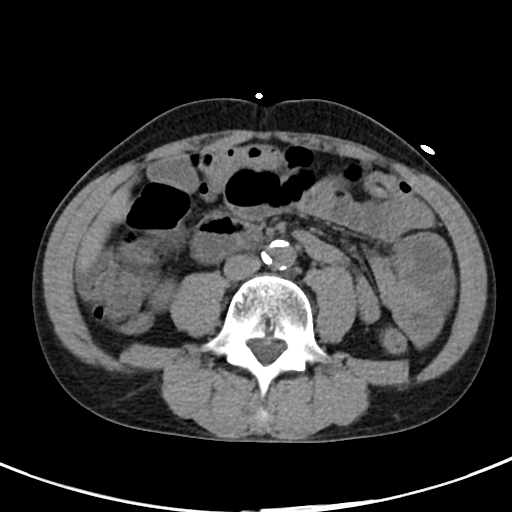
[im 59/88  soft-tissue]
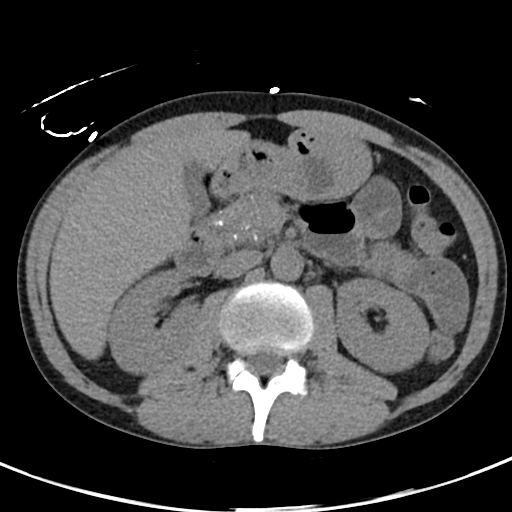
[im 59/88  bone]
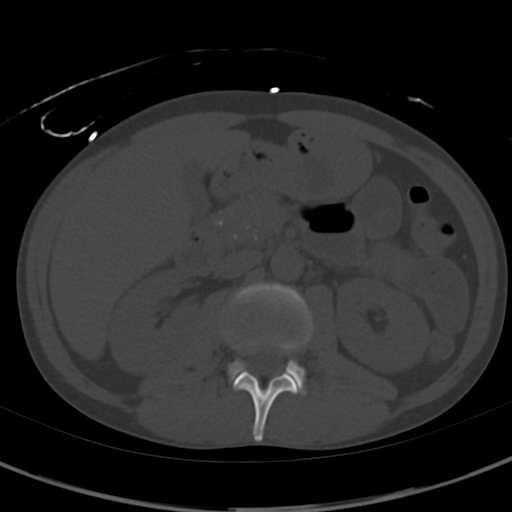
[im 63/88  soft-tissue]
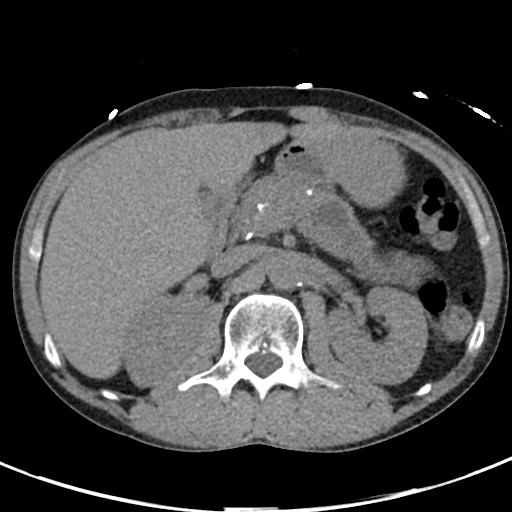
[im 68/88  soft-tissue]
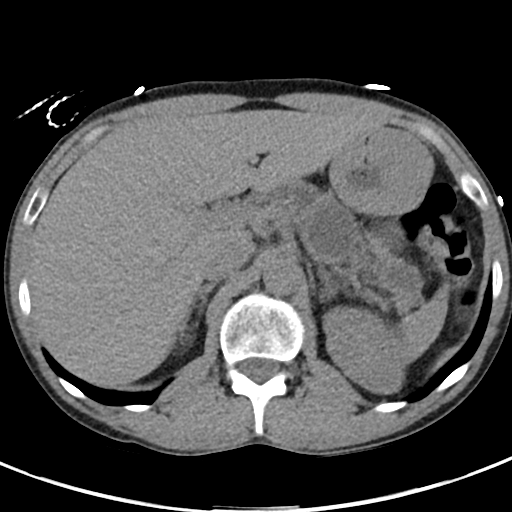
[im 78/88  soft-tissue]
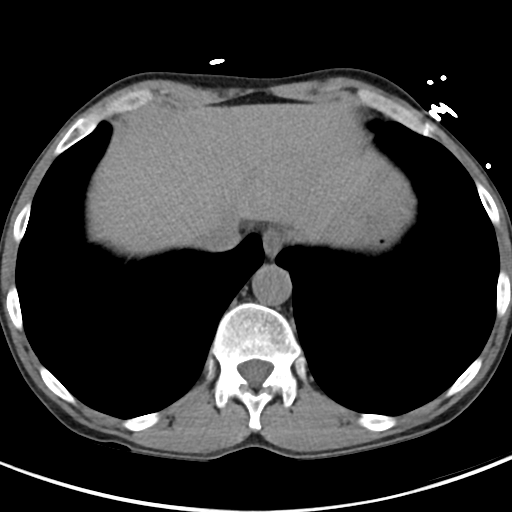
[im 83/88  soft-tissue]
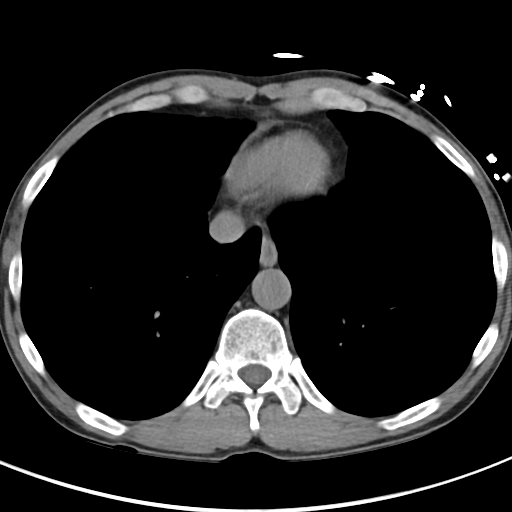

[Series 6: cor · coronal · 0.77mm/px · 3 of 72 slices shown]
[im 24/72  soft-tissue]
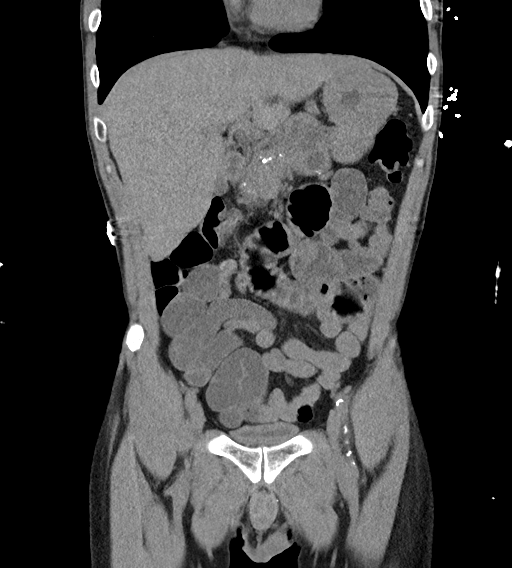
[im 32/72  soft-tissue]
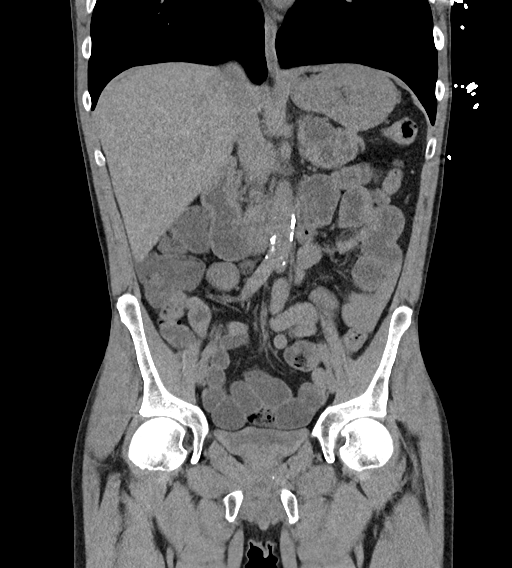
[im 40/72  soft-tissue]
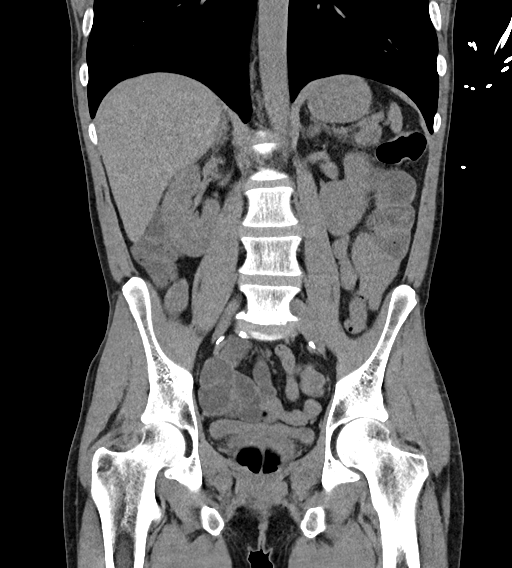

[16 of 46 positions shown; findings below may reference images not displayed]

FINDINGS: Lower chest: The lung bases are clear. Visualized cardiac structures
are within normal limits for size. No pericardial effusion.
Unremarkable visualized distal thoracic esophagus.

Hepatobiliary: Normal hepatic contour and morphology. No discrete
hepatic lesions. Normal appearance of the gallbladder. No intra or
extrahepatic biliary ductal dilatation.

Pancreas: The pancreas is abnormal. Multifocal low-attenuation fluid
collections are present within the pancreatic tail. Coarse
calcifications are present in the body, head and uncinate process.
Overall, this appearance is unchanged compared to prior imaging. No
significant inflammatory changes in the peripancreatic fat.

Spleen: The spleen remains atretic.  No acute abnormality.

Adrenals/Urinary Tract: Unremarkable adrenal glands. No
hydronephrosis, nephrolithiasis or renal contour abnormality. The
ureters and bladder are unremarkable.

Stomach/Bowel: Solitary loop of mildly dilated small bowel in the
mid abdomen without a focal transition point. This abnormality is
also evident on the scout images and may represent local enteritis.
There is a suggestion of very mild thickening of the valvulae. No
evidence of obstruction. Similar appearance of the stomach with
diffuse thickening along the wall of the greater curvature.

Vascular/Lymphatic: Limited evaluation in the absence of intravenous
contrast. Atherosclerotic calcifications present in the abdominal
aorta. No suspicious lymphadenopathy.

Reproductive: Prostate is unremarkable.

Other: No abdominal wall hernia or ascites.

Musculoskeletal: No acute fracture or aggressive appearing lytic or
blastic osseous lesion. L5-S1 degenerative disc disease.
IMPRESSION: 1. Solitary loop of mildly dilated and thickened small bowel in the
mid abdomen may represent localized enteritis. Both infectious, and
less likely inflammatory etiologies are considerations.
2. Sequelae of chronic pancreatitis without significant interval
change or imaging evidence of acute inflammation.
3. Similar appearance of the stomach with extensive wall thickening
along the greater curvature. While the imaging findings remain
suspicious for gastritis, patient underwent upper endoscopy on
11/14/2018 which was negative.
4.  Aortic Atherosclerosis ([id number]-170.0)

## 2021-05-24 ENCOUNTER — Encounter (HOSPITAL_COMMUNITY): Payer: Self-pay

## 2021-05-24 ENCOUNTER — Emergency Department (HOSPITAL_COMMUNITY)
Admission: EM | Admit: 2021-05-24 | Discharge: 2021-05-24 | Disposition: A | Discharge status: Home / Self Care | Payer: Medicaid Other | Attending: Emergency Medicine | Admitting: Emergency Medicine

## 2021-05-24 ENCOUNTER — Other Ambulatory Visit: Payer: Self-pay

## 2021-05-24 DIAGNOSIS — K860 Alcohol-induced chronic pancreatitis: Secondary | ICD-10-CM | POA: Insufficient documentation

## 2021-05-24 DIAGNOSIS — R1011 Right upper quadrant pain: Secondary | ICD-10-CM | POA: Diagnosis present

## 2021-05-24 DIAGNOSIS — Z91040 Latex allergy status: Secondary | ICD-10-CM | POA: Diagnosis not present

## 2021-05-24 LAB — CBC WITH DIFFERENTIAL/PLATELET
Abs Immature Granulocytes: 0.06 10*3/uL (ref 0.00–0.07)
Basophils Absolute: 0.1 10*3/uL (ref 0.0–0.1)
Basophils Relative: 1 %
Eosinophils Absolute: 0.3 10*3/uL (ref 0.0–0.5)
Eosinophils Relative: 3 %
HCT: 38.7 % — ABNORMAL LOW (ref 39.0–52.0)
Hemoglobin: 12.2 g/dL — ABNORMAL LOW (ref 13.0–17.0)
Immature Granulocytes: 1 %
Lymphocytes Relative: 33 %
Lymphs Abs: 3 10*3/uL (ref 0.7–4.0)
MCH: 30.2 pg (ref 26.0–34.0)
MCHC: 31.5 g/dL (ref 30.0–36.0)
MCV: 95.8 fL (ref 80.0–100.0)
Monocytes Absolute: 0.8 10*3/uL (ref 0.1–1.0)
Monocytes Relative: 9 %
Neutro Abs: 5 10*3/uL (ref 1.7–7.7)
Neutrophils Relative %: 53 %
Platelets: 301 10*3/uL (ref 150–400)
RBC: 4.04 MIL/uL — ABNORMAL LOW (ref 4.22–5.81)
RDW: 17.2 % — ABNORMAL HIGH (ref 11.5–15.5)
WBC: 9.2 10*3/uL (ref 4.0–10.5)
nRBC: 0 % (ref 0.0–0.2)

## 2021-05-24 LAB — COMPREHENSIVE METABOLIC PANEL
ALT: 25 U/L (ref 0–44)
AST: 34 U/L (ref 15–41)
Albumin: 4.1 g/dL (ref 3.5–5.0)
Alkaline Phosphatase: 89 U/L (ref 38–126)
Anion gap: 9 (ref 5–15)
BUN: 9 mg/dL (ref 6–20)
CO2: 22 mmol/L (ref 22–32)
Calcium: 8.7 mg/dL — ABNORMAL LOW (ref 8.9–10.3)
Chloride: 109 mmol/L (ref 98–111)
Creatinine, Ser: 0.98 mg/dL (ref 0.61–1.24)
GFR, Estimated: >60 mL/min (ref >60)
Glucose, Bld: 95 mg/dL (ref 70–99)
Potassium: 4.5 mmol/L (ref 3.5–5.1)
Sodium: 140 mmol/L (ref 135–145)
Total Bilirubin: 0.7 mg/dL (ref 0.3–1.2)
Total Protein: 8.6 g/dL — ABNORMAL HIGH (ref 6.5–8.1)

## 2021-05-24 LAB — URINALYSIS, ROUTINE W REFLEX MICROSCOPIC
Bilirubin Urine: NEGATIVE
Glucose, UA: NEGATIVE mg/dL
Hgb urine dipstick: NEGATIVE
Ketones, ur: NEGATIVE mg/dL
Leukocytes,Ua: NEGATIVE
Nitrite: NEGATIVE
Protein, ur: NEGATIVE mg/dL
Specific Gravity, Urine: 1.008 (ref 1.005–1.030)
pH: 5 (ref 5.0–8.0)

## 2021-05-24 LAB — LIPASE, BLOOD: Lipase: 31 U/L (ref 11–51)

## 2021-05-24 MED ORDER — OXYCODONE HCL 5 MG PO TABS
10.0000 mg | ORAL_TABLET | Freq: Once | ORAL | Status: AC
  Administered 2021-05-24: 10 mg via ORAL
  Filled 2021-05-24: qty 2

## 2021-05-24 MED ORDER — LIDOCAINE VISCOUS HCL 2 % MT SOLN
15.0000 mL | Freq: Once | OROMUCOSAL | Status: AC
  Administered 2021-05-24: 15 mL via ORAL
  Filled 2021-05-24: qty 15

## 2021-05-24 MED ORDER — ONDANSETRON 4 MG PO TBDP: 4.0000 mg | ORAL_TABLET | Freq: Three times a day (TID) | ORAL | 0 refills | Status: DC | PRN

## 2021-05-24 MED ORDER — SODIUM CHLORIDE 0.9 % IV BOLUS
500.0000 mL | Freq: Once | INTRAVENOUS | Status: AC
Start: 2021-05-24 — End: 2021-05-24
  Administered 2021-05-24: 500 mL via INTRAVENOUS

## 2021-05-24 MED ORDER — DROPERIDOL 2.5 MG/ML IJ SOLN
1.2500 mg | Freq: Once | INTRAMUSCULAR | Status: AC
  Administered 2021-05-24: 1.25 mg via INTRAVENOUS
  Filled 2021-05-24: qty 2

## 2021-05-24 MED ORDER — ALUM & MAG HYDROXIDE-SIMETH 200-200-20 MG/5ML PO SUSP
30.0000 mL | Freq: Once | ORAL | Status: AC
  Administered 2021-05-24: 30 mL via ORAL
  Filled 2021-05-24: qty 30

## 2021-05-24 NOTE — ED Triage Notes (Signed)
Pt BIB EMS with reports of RUQ pain that started last night. Pt complains of N/V and diarrhea since 9 pm.

## 2021-05-24 NOTE — ED Notes (Signed)
Given Water,crackers and cheese for PO challenge

## 2021-05-24 NOTE — ED Provider Notes (Signed)
[facility] DEPT Provider Note   CSN: [id number] Arrival date & time: 05/24/21  0235     History  Chief Complaint  Patient presents with   Abdominal Pain    [name] is a 51 y.o. male.  The history is provided by the patient and medical records.  Abdominal Pain [name] is a 51 y.o. male who presents to the Emergency Department complaining of abdominal pain.  He presents to the ED for evaluation of acute on chronic abdominal pain. He has a hx/o recurrent alcohol induced pancreatitis.  He was admitted to [facility] 1/3 and discharged 1/9.  He was admitted for acute pancreatitis.  He was treated for morphine and oxycodone for pain control.  He has RUQ abdominal pain that radiates to his right back and shoulder pain.  Pain started around 1/2 and his pain was well controlled at the time of hospital discharge.  Since then his pain has returned and worsened over the last several hours.  He has been eating a bland diet without grease.    No fever.  Vomited twice.  Has diarrhea.  Has urinary urgency.    Drank a 24 ounce beer yesterday.      Home Medications Prior to Admission medications   Medication Sig Start Date End Date Taking? Authorizing Provider  acetaminophen (TYLENOL) 500 MG tablet Take 500 mg by mouth every 6 (six) hours as needed for moderate pain or headache.    [provider]  albuterol (VENTOLIN HFA) 108 (90 Base) MCG/ACT inhaler Inhale 2 puffs into the lungs every 4 (four) hours as needed for wheezing or shortness of breath. 11/12/19   [name], DO  cholecalciferol (VITAMIN D) 25 MCG tablet Take 1 tablet (1,000 Units total) by mouth daily. 10/30/20   [name], MD  cyclobenzaprine (FLEXERIL) 10 MG tablet Take 10 mg by mouth 3 (three) times daily as needed for muscle spasms. 11/03/20   [provider]  fluticasone furoate-vilanterol (BREO ELLIPTA) 200-25 MCG/INH AEPB Inhale 1 puff into the lungs  daily. Patient taking differently: Inhale 1 puff into the lungs daily as needed (asthma). 11/12/19   [name], DO  folic acid (FOLVITE) 1 MG tablet Take 1 tablet (1 mg total) by mouth daily. 11/26/17   [name], MD  lidocaine (XYLOCAINE) 2 % solution Use as directed 15 mLs in the mouth or throat every 6 (six) hours as needed for mouth pain. 11/21/20   [name], [name], DO  lipase/protease/amylase (CREON) 36000 UNITS CPEP capsule Take 2 capsules (72,000 Units total) by mouth 3 (three) times daily with meals. For pancreatitis 11/05/19   [name], [name], MD  magnesium oxide (MAG-OX) 400 (240 Mg) MG tablet Take 400 mg by mouth daily. 12/24/20   [provider]  metoprolol (TOPROL-XL) 200 MG 24 hr tablet Take 200 mg by mouth daily. 10/29/20   [provider]  Multiple Vitamin (MULTIVITAMIN WITH MINERALS) TABS tablet Take 1 tablet by mouth daily. 09/06/17   Bonnell Public, MD  omeprazole (PRILOSEC) 40 MG capsule TAKE ONE CAPSULE BY MOUTH ONCE TO twice DAILY AS NEEDED 04/20/21   [name], [name], MD  ondansetron (ZOFRAN-ODT) 4 MG disintegrating tablet Take 1 tablet (4 mg total) by mouth every 8 (eight) hours as needed for vomiting or nausea. 05/24/21   Quintella Reichert, MD  OVER THE COUNTER MEDICATION Take 1 tablet by mouth daily. Iron supplement.    [provider]  Polyethyl Glycol-Propyl Glycol (  SYSTANE OP) Place 1 drop into both eyes daily as needed (dry eyes).    [provider]  potassium chloride (KLOR-CON) 10 MEQ tablet Take 10 mEq by mouth daily as needed (low potassium).    [provider]  QUEtiapine (SEROQUEL) 50 MG tablet Take 50 mg by mouth at bedtime.    [provider]  sertraline (ZOLOFT) 100 MG tablet Take 1 tablet (100 mg total) by mouth daily. 03/27/20   [name], MD  sucralfate (CARAFATE) 1 g tablet Take 1 tablet (1 g total) by mouth 4 (four) times daily -  with meals and at bedtime. 04/11/21   Rancour, Annie Main,  MD  sucralfate (CARAFATE) 1 GM/10ML suspension Take 10 mLs (1 g total) by mouth 4 (four) times daily -  with meals and at bedtime. 04/21/21   [name], [name], MD  thiamine 100 MG tablet Take 1 tablet (100 mg total) by mouth daily. 10/16/18   [name], MD  verapamil (CALAN-SR) 120 MG CR tablet Take 120 mg by mouth at bedtime. 12/24/20   [provider]  Vitamin D, Ergocalciferol, (DRISDOL) 1.25 MG (50000 UNIT) CAPS capsule Take 50,000 Units by mouth every Wednesday.    [provider]  amitriptyline (ELAVIL) 25 MG tablet Take 1 tablet (25 mg total) by mouth at bedtime. Patient not taking: Reported on 08/08/2019 10/15/18 08/08/19  [name], MD  gabapentin (NEURONTIN) 100 MG capsule Take 1 capsule (100 mg total) by mouth 2 (two) times daily. Patient not taking: Reported on 08/08/2019 10/15/18 08/08/19  [name], MD  promethazine (PHENERGAN) 25 MG tablet Take 1 tablet (25 mg total) by mouth every 6 (six) hours as needed for nausea or vomiting. Patient not taking: Reported on 08/08/2019 03/02/19 08/08/19  Kinnie Feil, PA-C      Allergies    Robaxin [methocarbamol], Aspirin, Shellfish-derived products, Trazodone and nefazodone, Adhesive [tape], Latex, Toradol [ketorolac tromethamine], Contrast media [iodinated contrast media], and Reglan [metoclopramide]    Review of Systems   Review of Systems  Gastrointestinal:  Positive for abdominal pain.  All other systems reviewed and are negative.  Physical Exam Updated Vital Signs BP (!) 140/99    Pulse 86    Temp 98.2 F (36.8 C) (Oral)    Resp 16    Ht 5\' 8"  (1.727 m)    Wt 54.4 kg    SpO2 100%    BMI 18.25 kg/m  Physical Exam Vitals and nursing note reviewed.  Constitutional:      Appearance: He is well-developed.  HENT:     Head: Normocephalic and atraumatic.  Cardiovascular:     Rate and Rhythm: Normal rate and regular rhythm.  Pulmonary:     Effort: Pulmonary effort is normal. No respiratory distress.   Abdominal:     Palpations: Abdomen is soft.     Tenderness: There is no guarding or rebound.     Comments: Mild generalized tenderness  Musculoskeletal:        General: No swelling or tenderness.  Skin:    General: Skin is warm and dry.  Neurological:     Mental Status: He is alert and oriented to person, place, and time.  Psychiatric:        Behavior: Behavior normal.    ED Results / Procedures / Treatments   Labs (all labs ordered are listed, but only abnormal results are displayed) Labs Reviewed  COMPREHENSIVE METABOLIC PANEL - Abnormal; Notable for the following components:  Result Value   Calcium 8.7 (*)    Total Protein 8.6 (*)    All other components within normal limits  CBC WITH DIFFERENTIAL/PLATELET - Abnormal; Notable for the following components:   RBC 4.04 (*)    Hemoglobin 12.2 (*)    HCT 38.7 (*)    RDW 17.2 (*)    All other components within normal limits  URINALYSIS, ROUTINE W REFLEX MICROSCOPIC - Abnormal; Notable for the following components:   Color, Urine STRAW (*)    All other components within normal limits  LIPASE, BLOOD    EKG None  Radiology No results found.  Procedures Procedures    Medications Ordered in ED Medications  oxyCODONE (Oxy IR/ROXICODONE) immediate release tablet 10 mg (has no administration in time range)  droperidol (INAPSINE) 2.5 MG/ML injection 1.25 mg (1.25 mg Intravenous Given 05/24/21 0620)  sodium chloride 0.9 % bolus 500 mL (500 mLs Intravenous New Bag/Given 05/24/21 0622)  alum & mag hydroxide-simeth (MAALOX/MYLANTA) 200-200-20 MG/5ML suspension 30 mL (30 mLs Oral Given 05/24/21 0620)    And  lidocaine (XYLOCAINE) 2 % viscous mouth solution 15 mL (15 mLs Oral Given 05/24/21 0620)    ED Course/ Medical Decision Making/ A&P                           Medical Decision Making  Patient with history of recurrent alcoholic pancreatitis here for evaluation of abdominal pain after just being admitted and discharged  for pancreatitis flare at another facility.  Records reviewed in epic.  Patient had several day hospitalization for pancreatitis flare with a possible gallstone identified on his right upper quadrant ultrasound.  His labs are stable when compared to priors.  He has no peritoneal findings.  Current clinical picture is not consistent with acute gallstone pancreatitis, biliary obstruction, cholangitis, acute decompensated pancreatitis.  Discussed with patient home care for pancreatitis.  Discussed that narcotic prescriptions will not be provided at time of discharge as this is is an ongoing and recurrent issue.  Discussed continuing his current medications as prescribed.        Final Clinical Impression(s) / ED Diagnoses Final diagnoses:  Alcohol-induced chronic pancreatitis ([place])    Rx / DC Orders ED Discharge Orders          Ordered    ondansetron (ZOFRAN-ODT) 4 MG disintegrating tablet  Every 8 hours PRN        05/24/21 0707              Quintella Reichert, MD 05/24/21 0710

## 2021-05-26 ENCOUNTER — Encounter: Payer: Self-pay | Admitting: Nurse Practitioner

## 2021-05-26 ENCOUNTER — Ambulatory Visit: Payer: Medicaid Other | Admitting: Nurse Practitioner

## 2021-05-26 VITALS — BP 110/72 | HR 82 | Ht 68.0 in | Wt 136.0 lb

## 2021-05-26 DIAGNOSIS — K860 Alcohol-induced chronic pancreatitis: Secondary | ICD-10-CM

## 2021-05-26 NOTE — Progress Notes (Signed)
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 Drainage benign.  Pain management has been challenging. He was dismissed from prior Pain Management Clinic. There were transportation problems apparently, he says his urine was "clean". He was hospitalized in [facility] earlier this month for abdominal pain and this was followed by an ED visit in [place] on 1/16.  Unfortunately he continues to drink Etoh.  --Patient asking Dr. Havery Moros to refer him to a different Pain Management Clinic. In the interim he asks Dr. Havery Moros prescribe some Oxycodone until he can get into a pain clinic. I told him that we do not routinely prescribe these medication but that I would discuss with Dr. Havery Moros.  --Continue Creon --Told again that he needs to stop ALL Etoh.   # History of PUD. He is taking PPI, Carafate   HISTORY OF PRESENT ILLNESS    Chief Complaint :  abdominal pain  [name] is a 51 y.o. male known to Dr. Havery Moros with a past medical history of chronic calcific pancreatitis with pseudocysts, PUD, gastritis, chronic anemia, Etoh dependence, polysubstance abuse.  Additional medical history as listed in [place] is followed by Dr. Havery Moros for severe chronic pancreatitis, chronic abdominal pain, GERD, and history of peptic ulcer disease.  He also has a history of alcohol dependence and tobacco use.  He has had multiple ED visits for abdominal pain.   June 2021 -establish care in the office with Dr. Havery Moros for evaluation of abdominal pain and altered bowel habits.  He had been dismissed from [facility], previous EGD showing gastric and duodenal ulcerations, CT scan showing chronic pancreatitis.  Scheduled for colon cancer screening as well EGD.  EGD showed a nonbleeding duodenal ulcer and esophageal Candida which we treated.  He was referred for an EUS to  evaluate abdominal imaging abnormalities including progressive dilation of the PD over 3 cm.  August 2021- EUS Ardis Hughs) to evaluate pancreatic cyst / Elevated Chromogranin A level.  Chronic inflammation found.  No evidence for malignancy but follow-up MRI recommended in 3 months November 2021- last seen by Dr. Havery Moros in the office.  He was counseled on need to stop alcohol use .  Given his long history of abdominal pain, history of duodenal ulcers, ongoing diarrhea, elevated chromogranin A level we he was scheduled for a PET scan to rule out gastrinoma.  June 2022 -hospitalized.  We were consulted for anemia and rectal bleeding.  EGD remarkable for gastritis.  Flexible sigmoidoscopy showed a small rectal ulcer without active bleeding.    INTERVAL HISTORY  He takes Creon 2 capsules 3 times daily with meals.  He is taking Carafate and 40 mg of omeprazole daily. Pain control has been difficult to manage.  He continues to drink alcohol and use tobacco despite multiple recommendations to discontinue both. He  says Dr. Havery Moros referred him to Pain Management but they dismissed him because he left an appt without being seen ( says he was riding with someone who could not wait).  Patient missed his appointment here in July as well as his appointment in October.   Patient was hospitalized in [facility] 05/11/21 to 05/17/21.  Notes reviewed in Care Everywhere.  CT abdomen and pelvis revealed findings consistent with chronic pancreatitis but no acute inflammation. Abdominal ultrasound showed one possible gallstone, without signs of cholecystitis.  Per  the discharge note patient continued to complain of significant abdominal pain requiring IV medication and/or increasing frequency of his oxycodone.  Despite this he was observed on multiple occasions walking the halls and actually left the unit to smoke.  Attempts to de-escalate pain medication was met with Protonix.  At one time there was some verbal abuse towards  the staff.  Psychiatry was involved to help with patient's alcohol use.  A Librium taper was completed while patient was in the hospital.  He was given resources for detox.  A rehab bed was located but on the day of discharge patient did not want the bed so he was discharged home.   Since leaving the hospital he has been back to ED ( [facility]) on 05/24/20.  He was not given any narcotics upon discharge from the ED . He is vomiting almost any thing he eats. He is eating plain foods, soft food, jello and yogurt. Taking Zofran which helps some but has to take 8 mg to get relief.  He continues to have constant generalized upper abdominal pain  radiating around to his right back  Right now patient doesn't have a primary provider due to some insurance changes. He now has [facility]. He is asking that we refer him to pain management. Also asks that Dr. Havery Moros refill pain meds for now.    Hepatic Function Latest Ref Rng & Units 05/24/2021 04/10/2021 02/24/2021  Total Protein 6.5 - 8.1 g/dL 8.6(H) 8.2(H) 6.7  Albumin 3.5 - 5.0 g/dL 4.1 4.5 3.6  AST 15 - 41 U/L 34 75(H) 33  ALT 0 - 44 U/L 25 88(H) 40  Alk Phosphatase 38 - 126 U/L 89 96 73  Total Bilirubin 0.3 - 1.2 mg/dL 0.7 1.3(H) 0.8  Bilirubin, Direct 0.0 - 0.2 mg/dL - - -    CBC Latest Ref Rng & Units 05/24/2021 04/11/2021 04/10/2021  WBC 4.0 - 10.5 K/uL 9.2 - 9.5  Hemoglobin 13.0 - 17.0 g/dL 12.2(L) 14.3 11.7(L)  Hematocrit 39.0 - 52.0 % 38.7(L) 42.0 36.0(L)  Platelets 150 - 400 K/uL 301 - 155    Lab Results  Component Value Date   LIPASE 31 05/24/2021    05/15/21 CBC 11.2, WBC 5.1   PREVIOUS ENDOSCOPIC EVALUATIONS / PERTINENT STUDIES:   July 2021 EGD and colonoscopy The examined portion of the ileum was normal. - Two 3 to 7 mm polyps in the transverse colon, removed with a cold snare. Resected and retrieved. - One 3 to 4 mm polyp in the sigmoid colon, removed with a cold snare. Resected and retrieved. - Internal hemorrhoids. - The  examination was otherwise normal. - Biopsies were taken with a cold forceps from the right colon, left colon and transverse colon for evaluation of microscopic colitis EGD --Esophagogastric landmarks identified. - Multiple plaques in the upper third of the esophagus, consistent with esophageal candidiasis. - Gastritis. - Normal stomach otherwise - biopsies taken to rule out H pylori - Non-bleeding duodenal ulcer with no stigmata of bleeding. Biopsied. - Duodenitis.  Diagnosis 1. Surgical [P], duodenal ulcer - PEPTIC DUODENITIS. EVIDENCE OF HEALING EROSION/ULCER. - WARTHIN-STARRY STAIN IS NEGATIVE FOR HELICOBACTER PYLORI. 2. Surgical [P], gastric antrum and gastric body - MILD CHRONIC GASTRITIS. MILD REACTION GASTROPATHY. - WARTHIN-STARRY STAIN IS NEGATIVE FOR HELICOBACTER PYLORI. 3. Surgical [P], colon, transverse, sigmoid, polyp (3) - TUBULAR ADENOMA, NEGATIVE FOR HIGH GRADE DYSPLASIA (X1). -HYPERPLASTIC POLYP (X2). 4. Surgical [P], colon, random sites - NO SIGNIFICANT PATHOLOGIC FINDINGS. - NEGATIVE FOR ACTIVE INFLAMMATION AND OTHER  ABNORMALITIES. Anastasia Canacci   EUS 01/02/20 - Medium sized (4.8cm) thick walled cystic lesion in the body of the pancreas. This was nearly completley aspirated, yielding 120cc of brownish, old blood tinged fluid with solid flecks of tissue which was sent for cytology, CEA and amylase. - Following cyst aspiration the pancreatic parenchyma showed pretty clear signs of chronic pancreatitis but no obvious mass lesions (except to the residual 'rind' from the cyst aspiration above). The changes of chronic pancreatitis signficantly limit the sensitivity of EUS (and other imaging modalities) to detect neolasm. - The previously noted duodenal bulb ulcer is much improved in the interim.    10/25/20 Flexible sigmoidoscopy for hematochezia -Preparation of the colon was fair. - Hemorrhoids found on digital rectal exam. - Stool in the entire examined  colon. - A single (solitary) ulcer in the distal rectum. Clips (MR conditional) were placed. - Non-bleeding non-thrombosed internal hemorrhoids.   10/25/20 EGD for abdominal pain -No gross lesions in esophagus. Z-line regular, 43 cm from the incisors. - 2 cm hiatal hernia noted. - Erythematous mucosa in the gastric body and antrum. No other gross lesions in the stomach. Previously biopsied within last 10 months and negative for HP. - No gross lesions in the duodenal bulb, in the first portion of the duodenum and in the second portion of the duodenum   05/11/21 CTAP wo contrast ( [facility]) IMPRESSION:  1. Sequela of chronic pancreatitis with pancreatic calcifications  and chronic pseudocysts, unchanged or slightly decreased in size  from CT last month. Mild haziness about the anterior pancreatic body  is similar to prior exam, may represent acute on chronic  pancreatitis.  2. Unchanged circumferential gastric wall thickening, unchanged from  prior exam.  3. Minimal colonic diverticulosis without diverticulitis.     05/14/21 RUQ [place] ( [facility]where IMPRESSION:  1. Probable single gallstone. No sonographic findings of  cholecystitis.  2. Upper normal common bile duct at 6-7 mm. No visualized  choledocholithiasis.     Current Medications, Allergies, Past Medical History, Past Surgical History, Family History and Social History were reviewed in [employer] record.     Current Outpatient Medications  Medication Sig Dispense Refill   acetaminophen (TYLENOL) 500 MG tablet Take 500 mg by mouth every 6 (six) hours as needed for moderate pain or headache.     albuterol (VENTOLIN HFA) 108 (90 Base) MCG/ACT inhaler Inhale 2 puffs into the lungs every 4 (four) hours as needed for wheezing or shortness of breath. 18 g 11   cholecalciferol (VITAMIN D) 25 MCG tablet Take 1 tablet (1,000 Units total) by mouth daily. 30 tablet 0   cyclobenzaprine (FLEXERIL) 10 MG  tablet Take 10 mg by mouth 3 (three) times daily as needed for muscle spasms.     fluticasone furoate-vilanterol (BREO ELLIPTA) 200-25 MCG/INH AEPB Inhale 1 puff into the lungs daily. (Patient taking differently: Inhale 1 puff into the lungs daily as needed (asthma).) 1 each 11   lidocaine (XYLOCAINE) 2 % solution Use as directed 15 mLs in the mouth or throat every 6 (six) hours as needed for mouth pain. 100 mL 0   lipase/protease/amylase (CREON) 36000 UNITS CPEP capsule Take 2 capsules (72,000 Units total) by mouth 3 (three) times daily with meals. For pancreatitis 180 capsule 5   magnesium oxide (MAG-OX) 400 (240 Mg) MG tablet Take 400 mg by mouth daily.     metoprolol (TOPROL-XL) 200 MG 24 hr tablet Take 200 mg by mouth daily.  mirtazapine (REMERON) 15 MG tablet Take 15 mg by mouth at bedtime.     Multiple Vitamin (MULTIVITAMIN WITH MINERALS) TABS tablet Take 1 tablet by mouth daily. 30 tablet 0   omeprazole (PRILOSEC) 40 MG capsule TAKE ONE CAPSULE BY MOUTH ONCE TO twice DAILY AS NEEDED 180 capsule 0   ondansetron (ZOFRAN-ODT) 4 MG disintegrating tablet Take 1 tablet (4 mg total) by mouth every 8 (eight) hours as needed for vomiting or nausea. 20 tablet 0   OVER THE COUNTER MEDICATION Take 1 tablet by mouth daily. Iron supplement.     oxyCODONE-acetaminophen (PERCOCET/ROXICET) 5-325 MG tablet Take 1 tablet by mouth every 6 (six) hours as needed.     Polyethyl Glycol-Propyl Glycol (SYSTANE OP) Place 1 drop into both eyes daily as needed (dry eyes).     potassium chloride (KLOR-CON) 10 MEQ tablet Take 10 mEq by mouth daily as needed (low potassium).     sertraline (ZOLOFT) 100 MG tablet Take 1 tablet (100 mg total) by mouth daily. 30 tablet 3   sucralfate (CARAFATE) 1 g tablet Take 1 tablet (1 g total) by mouth 4 (four) times daily -  with meals and at bedtime. 21 tablet 0   sucralfate (CARAFATE) 1 GM/10ML suspension Take 10 mLs (1 g total) by mouth 4 (four) times daily -  with meals and at  bedtime. 420 mL 1   thiamine 100 MG tablet Take 1 tablet (100 mg total) by mouth daily. 30 tablet 0   verapamil (CALAN-SR) 120 MG CR tablet Take 120 mg by mouth at bedtime.     Vitamin D, Ergocalciferol, (DRISDOL) 1.25 MG (50000 UNIT) CAPS capsule Take 50,000 Units by mouth every Wednesday.     folic acid (FOLVITE) 1 MG tablet Take 1 tablet (1 mg total) by mouth daily. (Patient not taking: Reported on 05/26/2021) 30 tablet 0   No current facility-administered medications for this visit.    Review of Systems: No chest pain. No shortness of breath. No urinary complaints.   PHYSICAL EXAM :    Wt Readings from Last 3 Encounters:  05/26/21 136 lb (61.7 kg)  05/24/21 120 lb (54.4 kg)  04/10/21 120 lb (54.4 kg)    BP 110/72    Pulse 82    Ht $R'5\' 8"'sD$  (1.727 m)    Wt 136 lb (61.7 kg)    BMI 20.68 kg/m  Constitutional:  Generally well appearing male in no acute distress. Psychiatric:  Normal mood and affect. Behavior is normal. EENT: Pupils normal.  Conjunctivae are normal. No scleral icterus. Neck supple.  Cardiovascular: Normal rate, regular rhythm. No edema Pulmonary/chest: Effort normal and breath sounds normal. No wheezing, rales or rhonchi. Abdominal: Soft, nondistended, mild-mod LUQ tenderness. Bowel sounds active throughout. There are no masses palpable. No hepatomegaly. Neurological: Alert and oriented to person place and time. Skin: Skin is warm and dry. No rashes noted.  Tye Savoy, NP  05/26/2021, 2:22 PM

## 2021-05-26 NOTE — Patient Instructions (Addendum)
Will discuss with Dr. Havery Moros tomorrow and call you with his decision.   Stop all Alcohol.  If you are age 52 or older, your body mass index should be between 23-30. Your Body mass index is 20.68 kg/m. If this is out of the aforementioned range listed, please consider follow up with your Primary Care Provider.  If you are age 20 or younger, your body mass index should be between 19-25. Your Body mass index is 20.68 kg/m. If this is out of the aformentioned range listed, please consider follow up with your Primary Care Provider.   ________________________________________________________  The  GI providers would like to encourage you to use [facility] to communicate with providers for non-urgent requests or questions.  Due to long hold times on the telephone, sending your provider a message by [facility] may be a faster and more efficient way to get a response.  Please allow 48 business hours for a response.  Please remember that this is for non-urgent requests.  _______________________________________________________

## 2021-05-26 NOTE — Progress Notes (Signed)
Agree with assessment as outlined. Difficult situation. He continues to drink alcohol despite his severe chronic pancreatitis, which is likely making his situation worse. I will not prescribe him narcotics for this unfortunately, it is not something I do in my practice. I am happy to refer him to another pain management clinic if he would like to re-establish with another pain management provider. He can see his primary care for narcotics if he is requesting this in the interim. He should continue his pancreatic enzymes.

## 2021-05-28 ENCOUNTER — Telehealth: Payer: Self-pay

## 2021-05-28 NOTE — Telephone Encounter (Signed)
Called the patient. No answer. Left a message asking for a return call to discuss referral to pain management center.

## 2021-05-28 NOTE — Telephone Encounter (Addendum)
-----   Message from Willia Craze, NP sent at 05/28/2021  1:11 PM EST -----  [name], please let patient know that Dr. Havery Moros will not prescribe his pain medication.  He does agree to refer him to a pain clinic.  Would you please find out which pain clinic the patient was dismissed from so we do not send him back to that particular 1.  Other than that can you refer him to another  one  Dr. Havery Moros does not have any preference.  Thanks

## 2021-05-31 NOTE — Telephone Encounter (Signed)
Patient returning call.

## 2021-05-31 NOTE — Telephone Encounter (Signed)
Called the patientback. He is at an appointment and cannot talk. Needs me to call back later.

## 2021-05-31 NOTE — Telephone Encounter (Signed)
Inbound call from patient returning your call.  Requests to be called at [phone].

## 2021-06-01 ENCOUNTER — Other Ambulatory Visit: Payer: Self-pay | Admitting: Gastroenterology

## 2021-06-02 NOTE — Telephone Encounter (Signed)
Beth, he was able to get an appt at [facility] on [facility] last year. He was previously scheduled to see Dr. Emelia Loron. I am not sure if he is able to go back there? I did not see any communication as to why he can't. Last year, [facility] was working on getting credentialing to accept [facility]. You may be able to check with them as well. Thanks

## 2021-06-02 NOTE — Telephone Encounter (Signed)
Patient is aware of the referral. 

## 2021-06-02 NOTE — Telephone Encounter (Signed)
Spoke with the patient. He is not under pain management and agrees that this could be the best way for him to have management of his pain.  Referral will be started today.

## 2021-06-02 NOTE — Telephone Encounter (Signed)
[place] I have been directed to get the patient a pain management referral. I see you worked on this extensively. I see that [facility]  is out. [facility] is out. Was there somewhere you did find that will accept his diagnosis?  Thanks

## 2021-06-02 NOTE — Telephone Encounter (Addendum)
Records faxed to [facility] which has several locations. Phone [phone] and fax [phone]

## 2021-06-06 ENCOUNTER — Inpatient Hospital Stay (HOSPITAL_COMMUNITY)
Admission: EM | Admit: 2021-06-06 | Discharge: 2021-06-10 | DRG: 439 | Disposition: A | Discharge status: Home / Self Care | Payer: Medicaid Other | Attending: Internal Medicine | Admitting: Internal Medicine

## 2021-06-06 ENCOUNTER — Emergency Department (HOSPITAL_COMMUNITY): Payer: Medicaid Other

## 2021-06-06 ENCOUNTER — Other Ambulatory Visit: Payer: Self-pay

## 2021-06-06 ENCOUNTER — Encounter (HOSPITAL_COMMUNITY): Payer: Self-pay | Admitting: Emergency Medicine

## 2021-06-06 DIAGNOSIS — F1410 Cocaine abuse, uncomplicated: Secondary | ICD-10-CM | POA: Diagnosis present

## 2021-06-06 DIAGNOSIS — Z888 Allergy status to other drugs, medicaments and biological substances status: Secondary | ICD-10-CM

## 2021-06-06 DIAGNOSIS — F319 Bipolar disorder, unspecified: Secondary | ICD-10-CM | POA: Diagnosis present

## 2021-06-06 DIAGNOSIS — K860 Alcohol-induced chronic pancreatitis: Secondary | ICD-10-CM | POA: Diagnosis present

## 2021-06-06 DIAGNOSIS — E876 Hypokalemia: Secondary | ICD-10-CM | POA: Diagnosis present

## 2021-06-06 DIAGNOSIS — G8929 Other chronic pain: Secondary | ICD-10-CM | POA: Diagnosis present

## 2021-06-06 DIAGNOSIS — K852 Alcohol induced acute pancreatitis without necrosis or infection: Secondary | ICD-10-CM | POA: Diagnosis not present

## 2021-06-06 DIAGNOSIS — R112 Nausea with vomiting, unspecified: Secondary | ICD-10-CM | POA: Diagnosis not present

## 2021-06-06 DIAGNOSIS — F1010 Alcohol abuse, uncomplicated: Secondary | ICD-10-CM | POA: Diagnosis present

## 2021-06-06 DIAGNOSIS — K863 Pseudocyst of pancreas: Secondary | ICD-10-CM | POA: Diagnosis present

## 2021-06-06 DIAGNOSIS — Z79899 Other long term (current) drug therapy: Secondary | ICD-10-CM

## 2021-06-06 DIAGNOSIS — D573 Sickle-cell trait: Secondary | ICD-10-CM | POA: Diagnosis present

## 2021-06-06 DIAGNOSIS — D72825 Bandemia: Secondary | ICD-10-CM | POA: Diagnosis present

## 2021-06-06 DIAGNOSIS — Z808 Family history of malignant neoplasm of other organs or systems: Secondary | ICD-10-CM

## 2021-06-06 DIAGNOSIS — Z886 Allergy status to analgesic agent status: Secondary | ICD-10-CM

## 2021-06-06 DIAGNOSIS — N179 Acute kidney failure, unspecified: Secondary | ICD-10-CM | POA: Diagnosis present

## 2021-06-06 DIAGNOSIS — F17210 Nicotine dependence, cigarettes, uncomplicated: Secondary | ICD-10-CM | POA: Diagnosis present

## 2021-06-06 DIAGNOSIS — R109 Unspecified abdominal pain: Secondary | ICD-10-CM

## 2021-06-06 DIAGNOSIS — J45909 Unspecified asthma, uncomplicated: Secondary | ICD-10-CM | POA: Diagnosis present

## 2021-06-06 DIAGNOSIS — D72829 Elevated white blood cell count, unspecified: Secondary | ICD-10-CM

## 2021-06-06 DIAGNOSIS — I493 Ventricular premature depolarization: Secondary | ICD-10-CM | POA: Diagnosis present

## 2021-06-06 DIAGNOSIS — Z720 Tobacco use: Secondary | ICD-10-CM | POA: Diagnosis not present

## 2021-06-06 DIAGNOSIS — E871 Hypo-osmolality and hyponatremia: Secondary | ICD-10-CM | POA: Diagnosis present

## 2021-06-06 DIAGNOSIS — Z6372 Alcoholism and drug addiction in family: Secondary | ICD-10-CM | POA: Diagnosis not present

## 2021-06-06 DIAGNOSIS — E860 Dehydration: Secondary | ICD-10-CM | POA: Diagnosis present

## 2021-06-06 DIAGNOSIS — G43909 Migraine, unspecified, not intractable, without status migrainosus: Secondary | ICD-10-CM | POA: Diagnosis present

## 2021-06-06 DIAGNOSIS — E872 Acidosis, unspecified: Secondary | ICD-10-CM | POA: Diagnosis present

## 2021-06-06 DIAGNOSIS — Z91040 Latex allergy status: Secondary | ICD-10-CM

## 2021-06-06 DIAGNOSIS — Z20822 Contact with and (suspected) exposure to covid-19: Secondary | ICD-10-CM | POA: Diagnosis present

## 2021-06-06 DIAGNOSIS — F4310 Post-traumatic stress disorder, unspecified: Secondary | ICD-10-CM | POA: Diagnosis present

## 2021-06-06 DIAGNOSIS — F39 Unspecified mood [affective] disorder: Secondary | ICD-10-CM | POA: Diagnosis not present

## 2021-06-06 DIAGNOSIS — R1013 Epigastric pain: Secondary | ICD-10-CM | POA: Diagnosis not present

## 2021-06-06 DIAGNOSIS — Z811 Family history of alcohol abuse and dependence: Secondary | ICD-10-CM

## 2021-06-06 DIAGNOSIS — I10 Essential (primary) hypertension: Secondary | ICD-10-CM | POA: Diagnosis not present

## 2021-06-06 DIAGNOSIS — Z8711 Personal history of peptic ulcer disease: Secondary | ICD-10-CM

## 2021-06-06 DIAGNOSIS — Z91048 Other nonmedicinal substance allergy status: Secondary | ICD-10-CM

## 2021-06-06 DIAGNOSIS — Z8249 Family history of ischemic heart disease and other diseases of the circulatory system: Secondary | ICD-10-CM

## 2021-06-06 DIAGNOSIS — E7800 Pure hypercholesterolemia, unspecified: Secondary | ICD-10-CM | POA: Diagnosis present

## 2021-06-06 DIAGNOSIS — F1210 Cannabis abuse, uncomplicated: Secondary | ICD-10-CM | POA: Diagnosis present

## 2021-06-06 DIAGNOSIS — K219 Gastro-esophageal reflux disease without esophagitis: Secondary | ICD-10-CM | POA: Diagnosis present

## 2021-06-06 LAB — CBC
HCT: 39.4 % (ref 39.0–52.0)
Hemoglobin: 13 g/dL (ref 13.0–17.0)
MCH: 29.7 pg (ref 26.0–34.0)
MCHC: 33 g/dL (ref 30.0–36.0)
MCV: 90.2 fL (ref 80.0–100.0)
Platelets: 273 10*3/uL (ref 150–400)
RBC: 4.37 MIL/uL (ref 4.22–5.81)
RDW: 17.2 % — ABNORMAL HIGH (ref 11.5–15.5)
WBC: 13 10*3/uL — ABNORMAL HIGH (ref 4.0–10.5)
nRBC: 0 % (ref 0.0–0.2)

## 2021-06-06 LAB — URINALYSIS, ROUTINE W REFLEX MICROSCOPIC
Bilirubin Urine: NEGATIVE
Glucose, UA: NEGATIVE mg/dL
Ketones, ur: NEGATIVE mg/dL
Leukocytes,Ua: NEGATIVE
Nitrite: POSITIVE — AB
Protein, ur: NEGATIVE mg/dL
Specific Gravity, Urine: 1.015 (ref 1.005–1.030)
pH: 5.5 (ref 5.0–8.0)

## 2021-06-06 LAB — COMPREHENSIVE METABOLIC PANEL
ALT: 29 U/L (ref 0–44)
AST: 40 U/L (ref 15–41)
Albumin: 4.5 g/dL (ref 3.5–5.0)
Alkaline Phosphatase: 102 U/L (ref 38–126)
Anion gap: 16 — ABNORMAL HIGH (ref 5–15)
BUN: 10 mg/dL (ref 6–20)
CO2: 17 mmol/L — ABNORMAL LOW (ref 22–32)
Calcium: 9.3 mg/dL (ref 8.9–10.3)
Chloride: 102 mmol/L (ref 98–111)
Creatinine, Ser: 1.27 mg/dL — ABNORMAL HIGH (ref 0.61–1.24)
GFR, Estimated: >60 mL/min (ref >60)
Glucose, Bld: 125 mg/dL — ABNORMAL HIGH (ref 70–99)
Potassium: 3.7 mmol/L (ref 3.5–5.1)
Sodium: 135 mmol/L (ref 135–145)
Total Bilirubin: 0.9 mg/dL (ref 0.3–1.2)
Total Protein: 8.5 g/dL — ABNORMAL HIGH (ref 6.5–8.1)

## 2021-06-06 LAB — LIPASE, BLOOD: Lipase: 46 U/L (ref 11–51)

## 2021-06-06 LAB — URINALYSIS, MICROSCOPIC (REFLEX)

## 2021-06-06 LAB — TROPONIN I (HIGH SENSITIVITY): Troponin I (High Sensitivity): 14 ng/L (ref <18)

## 2021-06-06 MED ORDER — LORAZEPAM 1 MG PO TABS
1.0000 mg | ORAL_TABLET | ORAL | Status: AC | PRN
  Administered 2021-06-07: 4 mg via ORAL
  Administered 2021-06-08: 3 mg via ORAL
  Administered 2021-06-08 – 2021-06-09 (×4): 1 mg via ORAL
  Administered 2021-06-09: 2 mg via ORAL
  Filled 2021-06-06 (×2): qty 1
  Filled 2021-06-06: qty 3
  Filled 2021-06-06 (×2): qty 1
  Filled 2021-06-06: qty 4
  Filled 2021-06-06: qty 2

## 2021-06-06 MED ORDER — HYDROMORPHONE HCL 1 MG/ML IJ SOLN: 1.0000 mg | Freq: Once | INTRAMUSCULAR | Status: DC

## 2021-06-06 MED ORDER — HYDROMORPHONE HCL 1 MG/ML IJ SOLN
1.0000 mg | Freq: Once | INTRAMUSCULAR | Status: AC
  Administered 2021-06-06: 1 mg via INTRAVENOUS
  Filled 2021-06-06: qty 1

## 2021-06-06 MED ORDER — HYDROMORPHONE HCL 1 MG/ML IJ SOLN
0.5000 mg | INTRAMUSCULAR | Status: DC | PRN
  Administered 2021-06-07 – 2021-06-08 (×9): 0.5 mg via INTRAVENOUS
  Filled 2021-06-06 (×11): qty 1

## 2021-06-06 MED ORDER — ALUM & MAG HYDROXIDE-SIMETH 200-200-20 MG/5ML PO SUSP
30.0000 mL | Freq: Once | ORAL | Status: AC
  Administered 2021-06-06: 30 mL via ORAL
  Filled 2021-06-06: qty 30

## 2021-06-06 MED ORDER — SODIUM CHLORIDE 0.9 % IV BOLUS
2000.0000 mL | Freq: Once | INTRAVENOUS | Status: AC
  Administered 2021-06-06: 2000 mL via INTRAVENOUS

## 2021-06-06 MED ORDER — ONDANSETRON HCL 4 MG/2ML IJ SOLN
4.0000 mg | Freq: Once | INTRAMUSCULAR | Status: AC
  Administered 2021-06-06: 4 mg via INTRAVENOUS
  Filled 2021-06-06: qty 2

## 2021-06-06 MED ORDER — LORAZEPAM 2 MG/ML IJ SOLN: 1.0000 mg | INTRAMUSCULAR | Status: AC | PRN

## 2021-06-06 MED ORDER — ONDANSETRON HCL 4 MG/2ML IJ SOLN
4.0000 mg | Freq: Four times a day (QID) | INTRAMUSCULAR | Status: DC | PRN
  Administered 2021-06-08 – 2021-06-10 (×4): 4 mg via INTRAVENOUS
  Filled 2021-06-06 (×7): qty 2

## 2021-06-06 MED ORDER — ACETAMINOPHEN 325 MG PO TABS
650.0000 mg | ORAL_TABLET | Freq: Four times a day (QID) | ORAL | Status: DC | PRN
  Administered 2021-06-07: 650 mg via ORAL
  Filled 2021-06-06 (×2): qty 2

## 2021-06-06 MED ORDER — NALOXONE HCL 0.4 MG/ML IJ SOLN: 0.4000 mg | INTRAMUSCULAR | Status: DC | PRN

## 2021-06-06 MED ORDER — LIDOCAINE VISCOUS HCL 2 % MT SOLN
15.0000 mL | Freq: Once | OROMUCOSAL | Status: AC
Start: 2021-06-06 — End: 2021-06-06
  Administered 2021-06-06: 15 mL via ORAL
  Filled 2021-06-06: qty 15

## 2021-06-06 MED ORDER — THIAMINE HCL 100 MG/ML IJ SOLN
Freq: Once | INTRAVENOUS | Status: AC
  Filled 2021-06-06: qty 1000

## 2021-06-06 MED ORDER — LACTATED RINGERS IV SOLN: INTRAVENOUS | Status: DC

## 2021-06-06 MED ORDER — ACETAMINOPHEN 650 MG RE SUPP: 650.0000 mg | Freq: Four times a day (QID) | RECTAL | Status: DC | PRN

## 2021-06-06 NOTE — ED Notes (Signed)
Pt requested the nurse for pain medication.

## 2021-06-06 NOTE — ED Provider Notes (Signed)
[facility] EMERGENCY DEPARTMENT Provider Note   CSN: [id number] Arrival date & time: 06/06/21  1458     History  Chief Complaint  Patient presents with   Chest Pain    [name] is a 51 y.o. male.  The history is provided by the patient and medical records. No language interpreter was used.  Chest Pain  51 year old male with significant history of alcohol abuse, chronic pancreatitis, polysubstance abuse brought here via EMS from home with complaints of chest pain and abdominal pain.Patient report for the past 2 days he has had abdominal pain.  Pain is mostly to his upper abdomen, sharp, stabbing, felt similar to pancreatitis pain that he had in the past.  His pain also radiates towards his chest this morning with associate shortness of breath.  Pain is moderate to severe, persistent.  He endorsed feeling nauseous, has vomited a few times as well as having some diarrhea.  He admits to using alcohol a few days ago.  Last marijuana use was about a week ago.  He also took Viagra this morning.  He does have medication at home but unable to tolerate any of them due to his persistent nausea and vomiting.  Patient felt he is dehydrated.  Home Medications Prior to Admission medications   Medication Sig Start Date End Date Taking? Authorizing Provider  acetaminophen (TYLENOL) 500 MG tablet Take 500 mg by mouth every 6 (six) hours as needed for moderate pain or headache.    [provider]  albuterol (VENTOLIN HFA) 108 (90 Base) MCG/ACT inhaler Inhale 2 puffs into the lungs every 4 (four) hours as needed for wheezing or shortness of breath. 11/12/19   [name], DO  cholecalciferol (VITAMIN D) 25 MCG tablet Take 1 tablet (1,000 Units total) by mouth daily. 10/30/20   [name], MD  cyclobenzaprine (FLEXERIL) 10 MG tablet Take 10 mg by mouth 3 (three) times daily as needed for muscle spasms. 11/03/20   [provider]  fluticasone  furoate-vilanterol (BREO ELLIPTA) 200-25 MCG/INH AEPB Inhale 1 puff into the lungs daily. Patient taking differently: Inhale 1 puff into the lungs daily as needed (asthma). 11/12/19   [name], DO  folic acid (FOLVITE) 1 MG tablet Take 1 tablet (1 mg total) by mouth daily. Patient not taking: Reported on 05/26/2021 11/26/17   [name], MD  lidocaine (XYLOCAINE) 2 % solution Use as directed 15 mLs in the mouth or throat every 6 (six) hours as needed for mouth pain. 11/21/20   [name], [name], DO  lipase/protease/amylase (CREON) 36000 UNITS CPEP capsule Take 2 capsules (72,000 Units total) by mouth 3 (three) times daily with meals. For pancreatitis 11/05/19   [name], [name], MD  magnesium oxide (MAG-OX) 400 (240 Mg) MG tablet Take 400 mg by mouth daily. 12/24/20   [provider]  metoprolol (TOPROL-XL) 200 MG 24 hr tablet Take 200 mg by mouth daily. 10/29/20   [provider]  mirtazapine (REMERON) 15 MG tablet Take 15 mg by mouth at bedtime. 05/17/21   [provider]  Multiple Vitamin (MULTIVITAMIN WITH MINERALS) TABS tablet Take 1 tablet by mouth daily. 09/06/17   Bonnell Public, MD  omeprazole (PRILOSEC) 40 MG capsule TAKE ONE CAPSULE BY MOUTH ONCE TO twice DAILY AS NEEDED 04/20/21   [name], [name], MD  ondansetron (ZOFRAN-ODT) 4 MG disintegrating tablet Take 1 tablet (4 mg total) by mouth every 8 (eight) hours as needed for vomiting or nausea.  05/24/21   Quintella Reichert, MD  OVER THE COUNTER MEDICATION Take 1 tablet by mouth daily. Iron supplement.    [provider]  oxyCODONE-acetaminophen (PERCOCET/ROXICET) 5-325 MG tablet Take 1 tablet by mouth every 6 (six) hours as needed. 05/17/21   [provider]  Polyethyl Glycol-Propyl Glycol (SYSTANE OP) Place 1 drop into both eyes daily as needed (dry eyes).    [provider]  potassium chloride (KLOR-CON) 10 MEQ tablet Take 10 mEq by mouth daily as needed (low potassium).     [provider]  sertraline (ZOLOFT) 100 MG tablet Take 1 tablet (100 mg total) by mouth daily. 03/27/20   [name], MD  sucralfate (CARAFATE) 1 g tablet Take 1 tablet (1 g total) by mouth 4 (four) times daily -  with meals and at bedtime. 04/11/21   Rancour, Annie Main, MD  sucralfate (CARAFATE) 1 GM/10ML suspension Take 10 MLs BY MOUTH FOUR TIMES DAILY with meals AND AT BEDTIME 06/01/21   [name], [name], MD  thiamine 100 MG tablet Take 1 tablet (100 mg total) by mouth daily. 10/16/18   [name], MD  verapamil (CALAN-SR) 120 MG CR tablet Take 120 mg by mouth at bedtime. 12/24/20   [provider]  Vitamin D, Ergocalciferol, (DRISDOL) 1.25 MG (50000 UNIT) CAPS capsule Take 50,000 Units by mouth every Wednesday.    [provider]  amitriptyline (ELAVIL) 25 MG tablet Take 1 tablet (25 mg total) by mouth at bedtime. Patient not taking: Reported on 08/08/2019 10/15/18 08/08/19  [name], MD  gabapentin (NEURONTIN) 100 MG capsule Take 1 capsule (100 mg total) by mouth 2 (two) times daily. Patient not taking: Reported on 08/08/2019 10/15/18 08/08/19  [name], MD  promethazine (PHENERGAN) 25 MG tablet Take 1 tablet (25 mg total) by mouth every 6 (six) hours as needed for nausea or vomiting. Patient not taking: Reported on 08/08/2019 03/02/19 08/08/19  Kinnie Feil, PA-C      Allergies    Robaxin [methocarbamol], Aspirin, Shellfish-derived products, Trazodone and nefazodone, Adhesive [tape], Latex, Toradol [ketorolac tromethamine], Contrast media [iodinated contrast media], and Reglan [metoclopramide]    Review of Systems   Review of Systems  Cardiovascular:  Positive for chest pain.  All other systems reviewed and are negative.  Physical Exam Updated Vital Signs BP (!) 171/105    Pulse (!) 125    Temp 99.3 F (37.4 C) (Oral)    Resp 20    SpO2 100%  Physical Exam Vitals and nursing note reviewed.  Constitutional:      General: He is not in acute  distress.    Appearance: He is well-developed.  HENT:     Head: Atraumatic.  Eyes:     Conjunctiva/sclera: Conjunctivae normal.  Cardiovascular:     Rate and Rhythm: Tachycardia present.     Pulses: Normal pulses.  Pulmonary:     Effort: Pulmonary effort is normal.     Breath sounds: Normal breath sounds.  Abdominal:     Palpations: Abdomen is soft.     Tenderness: There is abdominal tenderness (Diffusely tender no guarding or rebound tenderness).  Musculoskeletal:     Cervical back: Neck supple.  Skin:    Findings: No rash.  Neurological:     Mental Status: He is alert.    ED Results / Procedures / Treatments   Labs (all labs ordered are listed, but only abnormal results are displayed) Labs Reviewed  CBC - Abnormal; Notable for the following components:  Result Value   WBC 13.0 (*)    RDW 17.2 (*)    All other components within normal limits  COMPREHENSIVE METABOLIC PANEL - Abnormal; Notable for the following components:   CO2 17 (*)    Glucose, Bld 125 (*)    Creatinine, Ser 1.27 (*)    Total Protein 8.5 (*)    Anion gap 16 (*)    All other components within normal limits  LIPASE, BLOOD  URINALYSIS, ROUTINE W REFLEX MICROSCOPIC  TROPONIN I (HIGH SENSITIVITY)    EKG EKG Interpretation  Date/Time:  Sunday June 06 2021 15:41:13 EST Ventricular Rate:  123 PR Interval:  90 QRS Duration: 78 QT Interval:  282 QTC Calculation: 404 R Axis:   68 Text Interpretation: Sinus tachycardia Multiform ventricular premature complexes LAE, consider biatrial enlargement Repol abnrm suggests ischemia, diffuse leads Confirmed by [name] [phone]) on 06/06/2021 4:20:57 PM  Radiology CT ABDOMEN PELVIS WO CONTRAST  Result Date: 06/06/2021 CLINICAL DATA:  Left upper quadrant abdominal pain. History of chronic pancreatitis. EXAM: CT ABDOMEN AND PELVIS WITHOUT CONTRAST TECHNIQUE: Multidetector CT imaging of the abdomen and pelvis was performed following the standard  protocol without IV contrast. RADIATION DOSE REDUCTION: This exam was performed according to the departmental dose-optimization program which includes automated exposure control, adjustment of the mA and/or kV according to patient size and/or use of iterative reconstruction technique. COMPARISON:  CT abdomen and pelvis 04/11/2021. FINDINGS: Lower chest: No acute abnormality. Hepatobiliary: No focal liver abnormality is seen. No gallstones, gallbladder wall thickening, or biliary dilatation. Pancreas: Diffuse pancreatic calcifications are again seen. There is mild inflammatory stranding surrounding the body of the pancreas. There are 2 low-attenuation/cystic lesions in the body and tail the pancreas measuring 1.7 and 1.0 cm respectively. These have not significantly changed. Spleen: Normal in size without focal abnormality. Adrenals/Urinary Tract: Adrenal glands are unremarkable. Kidneys are normal, without renal calculi, focal lesion, or hydronephrosis. Bladder is unremarkable. Stomach/Bowel: Stomach is within normal limits. Appendix appears normal. No evidence of bowel wall thickening, distention, or inflammatory changes. There are scattered air-fluid levels throughout the small bowel. Vascular/Lymphatic: Aortic atherosclerosis. No enlarged abdominal or pelvic lymph nodes. Reproductive: Prostate is unremarkable. Other: No abdominal wall hernia or abnormality. No abdominopelvic ascites. Musculoskeletal: No acute or significant osseous findings. IMPRESSION: 1. Mild acute pancreatitis. 2. 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 3. Air-fluid levels throughout small bowel loops. Findings are nonspecific, but can be seen with enteritis. Electronically Signed   By: Ronney Asters M.D.   On: 06/06/2021 22:04   DG Chest 2 View  Result Date: 06/06/2021 CLINICAL DATA:  Chest and abdominal pain. Nausea vomiting and diarrhea for 1 day. EXAM: CHEST - 2 VIEW  COMPARISON:  03/23/2021. FINDINGS: Cardiac silhouette is normal in size. Normal mediastinal and hilar contours. Clear lungs.  No pleural effusion or pneumothorax. Skeletal structures are intact. IMPRESSION: No active cardiopulmonary disease. Electronically Signed   By: Lajean Manes M.D.   On: 06/06/2021 15:53    Procedures Procedures    Medications Ordered in ED Medications  sodium chloride 0.9 % bolus 2,000 mL (0 mLs Intravenous Stopped 06/06/21 1844)  ondansetron (ZOFRAN) injection 4 mg (4 mg Intravenous Given 06/06/21 1618)  HYDROmorphone (DILAUDID) injection 1 mg (1 mg Intravenous Given 06/06/21 1638)  HYDROmorphone (DILAUDID) injection 1 mg (1 mg Intravenous Given 06/06/21 1927)    ED Course/ Medical Decision Making/ A&P  Medical Decision Making Amount and/or Complexity of Data Reviewed External Data Reviewed: labs, radiology and notes. Labs: ordered. Decision-making details documented in ED Course. Radiology: ordered and independent interpretation performed. Decision-making details documented in ED Course. ECG/medicine tests: ordered and independent interpretation performed. Decision-making details documented in ED Course.  Risk OTC drugs. Prescription drug management. Parenteral controlled substances. Decision regarding hospitalization. Diagnosis or treatment significantly limited by social determinants of health.   BP (!) 171/105    Pulse (!) 125    Temp 99.3 F (37.4 C) (Oral)    Resp 20    SpO2 100%   4:05 PM This is a 51 year old male with significant past medical history include alcohol induced pancreatitis, polysubstance use, WPW, GERD, bipolar, who is here with complaint of abdominal pain as well as pain in his chest and trouble breathing.  On exam he appears a bit uncomfortable, he does have diffuse abdominal tenderness without guarding or rebound tenderness.  He is tachycardic on exam.  Cardiac monitor shows a heart rate of 125.  His  elevated heart rate likely due to dehydration and discomfort from persistent nausea vomiting.  His primary complaint is the abdominal discomfort.  Patient does have an ER care plan as he has presented to the ED multiple times with similar presentation and has had extensive work-up including multiple CT imaging in the past.  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  He is tachycardic with evidence of sinus tachycardia.  We will give IV fluid and may consider propanolol for better rate control if IV fluid does not seems to resolve his tachycardia.  7:51 PM Labs, EKG, and chest x-ray was independently reviewed and interpreted by me.  Patient has an elevated white count of 13.0.  This could be due to stress acutely secondary to his chronic pancreatitis.  He has normal lipase.  Creatinine is slightly abnormal at 1.27, IV fluid given.  An x-ray of his chest obtained showed no acute cardiopulmonary disease.  EKG was independently reviewed interpreted by me showing sinus tachycardia.  Patient did receive multiple doses of opiate pain medication including Dilaudid during this visit.  I did discuss care with Dr. Roslynn Amble  8:08 PM Patient remains tachycardic despite receiving IV fluid.  He has received opiate pain medication and reports some improvement.  He requested for GI cocktail, GI cocktail ordered.  8:23 PM Patient still remains uncomfortable.  Abdomen patient has had multiple CT imaging previously for similar presentation however due to elevated white count, persistent tachycardia despite IV fluid and opiate pain medication, will obtain CT scan of the abdomen pelvis for further assessment.  10:27 PM Abdominal pelvis CT scan obtained today which was independently reviewed by me and demonstrate mild acute pancreatitis.  I reassessed patient and he still endorse quite a bit of pain.  He still remains tachycardic despite receiving IV fluid.  Will provide additional dose of pain medication but after further  discussion my plan is to admit patient for symptom control.  Please note his vital signs did indicate several episodes of hypoxia however patient is satting at 100% when I reevaluate him and I have low suspicion for PE causing hypoxia.  10:52 PM Appreciate consultation from [facility] Dr. Velia Meyer who agrees to see and will admit pt for further care.          Final Clinical Impression(s) / ED Diagnoses Final diagnoses:  Alcohol induced acute pancreatitis without necrosis or infection    Rx / DC Orders ED Discharge Orders  None         Domenic Moras, PA-C 06/06/21 2253    Lucrezia Starch, MD 06/07/21 1329

## 2021-06-06 NOTE — ED Triage Notes (Addendum)
Pt to triage via [facility] from home.  Reports chest pain and SOB since 11am.  Also reporting "pancreatis pain"/ generalized abd pain since arrival to ED.  Last ETOH on Friday.  Reports nausea and vomiting.  Took Viagra this morning.  EMS- 20g IV L AC. No meds given

## 2021-06-07 ENCOUNTER — Encounter (HOSPITAL_COMMUNITY): Payer: Self-pay | Admitting: Internal Medicine

## 2021-06-07 DIAGNOSIS — R1013 Epigastric pain: Secondary | ICD-10-CM | POA: Diagnosis present

## 2021-06-07 DIAGNOSIS — D72825 Bandemia: Secondary | ICD-10-CM

## 2021-06-07 DIAGNOSIS — F1010 Alcohol abuse, uncomplicated: Secondary | ICD-10-CM

## 2021-06-07 DIAGNOSIS — D72829 Elevated white blood cell count, unspecified: Secondary | ICD-10-CM | POA: Diagnosis present

## 2021-06-07 DIAGNOSIS — F39 Unspecified mood [affective] disorder: Secondary | ICD-10-CM

## 2021-06-07 DIAGNOSIS — E876 Hypokalemia: Secondary | ICD-10-CM

## 2021-06-07 LAB — COMPREHENSIVE METABOLIC PANEL
ALT: 22 U/L (ref 0–44)
AST: 28 U/L (ref 15–41)
Albumin: 4 g/dL (ref 3.5–5.0)
Alkaline Phosphatase: 96 U/L (ref 38–126)
Anion gap: 12 (ref 5–15)
BUN: 8 mg/dL (ref 6–20)
CO2: 20 mmol/L — ABNORMAL LOW (ref 22–32)
Calcium: 8.8 mg/dL — ABNORMAL LOW (ref 8.9–10.3)
Chloride: 104 mmol/L (ref 98–111)
Creatinine, Ser: 0.78 mg/dL (ref 0.61–1.24)
GFR, Estimated: >60 mL/min (ref >60)
Glucose, Bld: 125 mg/dL — ABNORMAL HIGH (ref 70–99)
Potassium: 3.2 mmol/L — ABNORMAL LOW (ref 3.5–5.1)
Sodium: 136 mmol/L (ref 135–145)
Total Bilirubin: 1.1 mg/dL (ref 0.3–1.2)
Total Protein: 7.7 g/dL (ref 6.5–8.1)

## 2021-06-07 LAB — CBC WITH DIFFERENTIAL/PLATELET
Abs Immature Granulocytes: 0.05 10*3/uL (ref 0.00–0.07)
Basophils Absolute: 0 10*3/uL (ref 0.0–0.1)
Basophils Relative: 0 %
Eosinophils Absolute: 0.2 10*3/uL (ref 0.0–0.5)
Eosinophils Relative: 2 %
HCT: 36.9 % — ABNORMAL LOW (ref 39.0–52.0)
Hemoglobin: 11.9 g/dL — ABNORMAL LOW (ref 13.0–17.0)
Immature Granulocytes: 1 %
Lymphocytes Relative: 23 %
Lymphs Abs: 2.5 10*3/uL (ref 0.7–4.0)
MCH: 29.8 pg (ref 26.0–34.0)
MCHC: 32.2 g/dL (ref 30.0–36.0)
MCV: 92.3 fL (ref 80.0–100.0)
Monocytes Absolute: 1.4 10*3/uL — ABNORMAL HIGH (ref 0.1–1.0)
Monocytes Relative: 13 %
Neutro Abs: 6.5 10*3/uL (ref 1.7–7.7)
Neutrophils Relative %: 61 %
Platelets: 226 10*3/uL (ref 150–400)
RBC: 4 MIL/uL — ABNORMAL LOW (ref 4.22–5.81)
RDW: 17.2 % — ABNORMAL HIGH (ref 11.5–15.5)
WBC: 10.7 10*3/uL — ABNORMAL HIGH (ref 4.0–10.5)
nRBC: 0 % (ref 0.0–0.2)

## 2021-06-07 LAB — PROTIME-INR
INR: 1 (ref 0.8–1.2)
Prothrombin Time: 13.4 seconds (ref 11.4–15.2)

## 2021-06-07 LAB — RAPID URINE DRUG SCREEN, HOSP PERFORMED
Amphetamines: NOT DETECTED
Amphetamines: NOT DETECTED
Barbiturates: NOT DETECTED
Barbiturates: NOT DETECTED
Benzodiazepines: POSITIVE — AB
Benzodiazepines: POSITIVE — AB
Cocaine: NOT DETECTED
Cocaine: NOT DETECTED
Opiates: NOT DETECTED
Opiates: POSITIVE — AB
Tetrahydrocannabinol: NOT DETECTED
Tetrahydrocannabinol: NOT DETECTED

## 2021-06-07 LAB — TRIGLYCERIDES: Triglycerides: 142 mg/dL (ref <150)

## 2021-06-07 LAB — MAGNESIUM: Magnesium: 1.3 mg/dL — ABNORMAL LOW (ref 1.7–2.4)

## 2021-06-07 LAB — RESP PANEL BY RT-PCR (FLU A&B, COVID) ARPGX2
Influenza A by PCR: NEGATIVE
Influenza B by PCR: NEGATIVE
SARS Coronavirus 2 by RT PCR: NEGATIVE

## 2021-06-07 LAB — CREATININE, URINE, RANDOM: Creatinine, Urine: 101.6 mg/dL

## 2021-06-07 LAB — PHOSPHORUS: Phosphorus: 3.1 mg/dL (ref 2.5–4.6)

## 2021-06-07 LAB — SODIUM, URINE, RANDOM: Sodium, Ur: 64 mmol/L

## 2021-06-07 MED ORDER — METOPROLOL SUCCINATE ER 100 MG PO TB24
200.0000 mg | ORAL_TABLET | Freq: Every day | ORAL | Status: DC
  Administered 2021-06-07 – 2021-06-10 (×4): 200 mg via ORAL
  Filled 2021-06-07 (×2): qty 2
  Filled 2021-06-07: qty 8
  Filled 2021-06-07: qty 2

## 2021-06-07 MED ORDER — THIAMINE HCL 100 MG/ML IJ SOLN: 100.0000 mg | Freq: Every day | INTRAMUSCULAR | Status: DC

## 2021-06-07 MED ORDER — LABETALOL HCL 5 MG/ML IV SOLN
10.0000 mg | INTRAVENOUS | Status: DC | PRN
  Administered 2021-06-08: 10 mg via INTRAVENOUS
  Filled 2021-06-07: qty 4

## 2021-06-07 MED ORDER — MIRTAZAPINE 15 MG PO TABS
15.0000 mg | ORAL_TABLET | Freq: Every day | ORAL | Status: DC
  Administered 2021-06-07 – 2021-06-09 (×3): 15 mg via ORAL
  Filled 2021-06-07 (×3): qty 1

## 2021-06-07 MED ORDER — SERTRALINE HCL 100 MG PO TABS
100.0000 mg | ORAL_TABLET | Freq: Every day | ORAL | Status: DC
  Administered 2021-06-07 – 2021-06-10 (×4): 100 mg via ORAL
  Filled 2021-06-07 (×4): qty 1

## 2021-06-07 MED ORDER — HYDROMORPHONE HCL 1 MG/ML IJ SOLN
1.0000 mg | Freq: Once | INTRAMUSCULAR | Status: AC
  Administered 2021-06-07: 1 mg via INTRAVENOUS
  Filled 2021-06-07: qty 1

## 2021-06-07 MED ORDER — FLUTICASONE FUROATE-VILANTEROL 200-25 MCG/ACT IN AEPB
1.0000 | INHALATION_SPRAY | Freq: Every day | RESPIRATORY_TRACT | Status: DC
  Administered 2021-06-08 – 2021-06-10 (×2): 1 via RESPIRATORY_TRACT
  Filled 2021-06-07 (×2): qty 28

## 2021-06-07 MED ORDER — NICOTINE 14 MG/24HR TD PT24: 14.0000 mg | MEDICATED_PATCH | Freq: Every day | TRANSDERMAL | Status: DC | PRN

## 2021-06-07 MED ORDER — MAGNESIUM SULFATE 4 GM/100ML IV SOLN
4.0000 g | Freq: Once | INTRAVENOUS | Status: AC
Start: 2021-06-07 — End: 2021-06-07
  Administered 2021-06-07: 4 g via INTRAVENOUS
  Filled 2021-06-07: qty 100

## 2021-06-07 MED ORDER — THIAMINE HCL 100 MG PO TABS
100.0000 mg | ORAL_TABLET | Freq: Every day | ORAL | Status: DC
  Administered 2021-06-07 – 2021-06-10 (×4): 100 mg via ORAL
  Filled 2021-06-07 (×4): qty 1

## 2021-06-07 MED ORDER — NICOTINE 14 MG/24HR TD PT24
14.0000 mg | MEDICATED_PATCH | Freq: Every day | TRANSDERMAL | Status: DC
  Administered 2021-06-07 – 2021-06-10 (×5): 14 mg via TRANSDERMAL
  Filled 2021-06-07 (×5): qty 1

## 2021-06-07 MED ORDER — FOLIC ACID 1 MG PO TABS
1.0000 mg | ORAL_TABLET | Freq: Every day | ORAL | Status: DC
  Administered 2021-06-07 – 2021-06-10 (×4): 1 mg via ORAL
  Filled 2021-06-07 (×4): qty 1

## 2021-06-07 MED ORDER — ADULT MULTIVITAMIN W/MINERALS CH
1.0000 | ORAL_TABLET | Freq: Every day | ORAL | Status: DC
  Administered 2021-06-07 – 2021-06-10 (×4): 1 via ORAL
  Filled 2021-06-07 (×4): qty 1

## 2021-06-07 MED ORDER — SUCRALFATE 1 GM/10ML PO SUSP
1.0000 g | Freq: Three times a day (TID) | ORAL | Status: DC
  Administered 2021-06-07 – 2021-06-10 (×12): 1 g via ORAL
  Filled 2021-06-07 (×11): qty 10

## 2021-06-07 MED ORDER — POTASSIUM CHLORIDE CRYS ER 20 MEQ PO TBCR
40.0000 meq | EXTENDED_RELEASE_TABLET | ORAL | Status: AC
  Administered 2021-06-07 (×2): 40 meq via ORAL
  Filled 2021-06-07 (×2): qty 2

## 2021-06-07 MED ORDER — GABAPENTIN 100 MG PO CAPS
100.0000 mg | ORAL_CAPSULE | Freq: Three times a day (TID) | ORAL | Status: DC
  Administered 2021-06-07 – 2021-06-08 (×4): 100 mg via ORAL
  Filled 2021-06-07 (×4): qty 1

## 2021-06-07 MED ORDER — PANCRELIPASE (LIP-PROT-AMYL) 36000-114000 UNITS PO CPEP
72000.0000 [IU] | ORAL_CAPSULE | Freq: Three times a day (TID) | ORAL | Status: DC
  Administered 2021-06-07 – 2021-06-10 (×8): 72000 [IU] via ORAL
  Filled 2021-06-07 (×8): qty 2

## 2021-06-07 NOTE — Plan of Care (Signed)
  Problem: Activity: Goal: Risk for activity intolerance will decrease Outcome: Progressing   

## 2021-06-07 NOTE — Progress Notes (Signed)
PROGRESS NOTE  [name] [name]:[id number] DOB: [birth year]   PCP: Pcp, No  Patient is from: Home  DOA: 06/06/2021 LOS: 1  Chief complaints:  Chief Complaint  Patient presents with   Chest Pain     Brief Narrative / Interim history: 51 year old M with PMH of chronic alcohol abuse, alcoholic pancreatitis, HTN, tobacco use disorder and mood disorder presenting with epigastric abdominal pain, nausea and 2-3 episodes of nonbloody nonbilious emesis for 1 to 2 days, and admitted for acute on chronic alcoholic pancreatitis with pseudocyst.  Lipase within normal.  CT abdomen and pelvis concerning for acute on chronic pancreatitis with suspected pseudocyst within the pancreatic body/tail.  Started on IV fluid, IV analgesics and antiemetics and admitted.  Subjective: Seen and examined earlier this morning.  No major events overnight of this morning.  Endorses about 8/10 epigastric pain.  He has some nausea but no emesis since he came to ED.  Denies chest pain, dyspnea, fever or UTI symptoms.  Objective: Vitals:   06/07/21 0600 06/07/21 0642 06/07/21 0800 06/07/21 1200  BP: (!) 157/117 (!) 157/117 (!) 165/101 (!) 168/108  Pulse:  (!) 114 (!) 105 95  Resp: 18 17 13 15   Temp:      TempSrc:  Oral    SpO2:  96% 100% 100%    Examination:  GENERAL: No apparent distress.  Nontoxic. HEENT: MMM.  Vision and hearing grossly intact.  NECK: Supple.  No apparent JVD.  RESP:  No IWOB.  Fair aeration bilaterally. CVS:  RRR. Heart sounds normal.  ABD/GI/GU: BS+. Abd soft.  Tenderness across upper abdomen. MSK/EXT:  Moves extremities. No apparent deformity. No edema.  SKIN: no apparent skin lesion or wound NEURO: Awake, alert and oriented appropriately.  No apparent focal neuro deficit. PSYCH: Calm. Normal affect.   Procedures:  None  Microbiology summarized: BOFBP-10 and influenza PCR nonreactive.  Assessment & Plan: Acute on chronic alcoholic pancreatitis with pseudocyst-admits to  drinking about 6 beers a day.  Last drink 3 days prior to presentation.  Lipase within normal.  CT abdomen and pelvis concerning for acute on chronic pancreatitis with suspected pseudocyst in the body and tail of pancreas.  Continues to endorse significant pain.  Rates the pain 8/10. -Start clear liquid diet -Continue IV fluid -IV analgesics and antiemetics. -Encourage alcohol cessation -TOC consulted for resources.  Alcohol abuse: No withdrawal symptoms.  Drinks about 6 beers a day.  Last drink 3 days prior to presentation. -Continue CIWA monitoring with as needed Ativan -Continue vitamins -TOC consulted for resources.  AKI: Resolved. Recent Labs    12/01/20 1526 12/02/20 0701 01/14/21 1706 02/24/21 0127 03/23/21 2139 04/10/21 2318 04/11/21 0509 05/24/21 0408 06/06/21 1513 06/07/21 0240  BUN 9 9 15 9 8 20 16 9 10 8   CREATININE 0.80 0.56* 1.05 0.71 0.87 1.35* 1.20 0.98 1.27* 0.78  -Monitor  Essential hypertension/sinus tachycardia: On metoprolol and verapamil outpatient. -Pain control as above. -Resume home metoprolol -Labetalol as needed  Hypokalemia/hypomagnesemia: K3.2.  Mg 1.3.  Likely due to alcohol. -P.o. KCl 40x2 -IV magnesium 4 g x 1 -Recheck in the morning  Mood disorder: Patient reports history of depression and medicating himself with alcohol.  Previously [facility] at [facility].  Currently denies suicidal or homicidal ideation. -Resume home Remeron and gabapentin. -TOC consulted for outpatient psychiatry referral  Tobacco use disorder: Smokes about a pack a day.   -Encouraged cessation. -Nicotine patch.  Leukocytosis/bandemia: Likely demargination in the setting of acute pancreatitis.  Improved without antibiotics.  There is no height or weight on file to calculate BMI.         DVT prophylaxis:  SCDs Start: 06/06/21 2249  Code Status: Full code Family Communication: Patient and/or RN. Available if any question.  Level of care: Progressive.  Change  level of care to telemetry Status is: Inpatient  Remains inpatient appropriate because: Acute on chronic alcoholic pancreatitis requiring IV pain medication, IV fluid, and significant electrolyte derangements       Consultants:  None   Sch Meds:  Scheduled Meds:  fluticasone furoate-vilanterol  1 puff Inhalation Daily   folic acid  1 mg Oral Daily   gabapentin  100 mg Oral TID   metoprolol  200 mg Oral Daily   mirtazapine  15 mg Oral QHS   multivitamin with minerals  1 tablet Oral Daily   nicotine  14 mg Transdermal Daily   potassium chloride  40 mEq Oral Q4H   sertraline  100 mg Oral Daily   sucralfate  1 g Oral TID WC & HS   thiamine  100 mg Oral Daily   Or   thiamine  100 mg Intravenous Daily   Continuous Infusions:  lactated ringers 125 mL/hr at 06/07/21 0014   PRN Meds:.acetaminophen **OR** acetaminophen, HYDROmorphone (DILAUDID) injection, labetalol, LORazepam **OR** LORazepam, naLOXone (NARCAN)  injection, ondansetron (ZOFRAN) IV  Antimicrobials: Anti-infectives (From admission, onward)    None        I have personally reviewed the following labs and images: CBC: Recent Labs  Lab 06/06/21 1513 06/07/21 0240  WBC 13.0* 10.7*  NEUTROABS  --  6.5  HGB 13.0 11.9*  HCT 39.4 36.9*  MCV 90.2 92.3  PLT 273 226   BMP &GFR Recent Labs  Lab 06/06/21 1513 06/07/21 0240  NA 135 136  K 3.7 3.2*  CL 102 104  CO2 17* 20*  GLUCOSE 125* 125*  BUN 10 8  CREATININE 1.27* 0.78  CALCIUM 9.3 8.8*  MG  --  1.3*  PHOS  --  3.1   Estimated Creatinine Clearance: 95.3 mL/min (by C-G formula based on SCr of 0.78 mg/dL). Liver & Pancreas: Recent Labs  Lab 06/06/21 1513 06/07/21 0240  AST 40 28  ALT 29 22  ALKPHOS 102 96  BILITOT 0.9 1.1  PROT 8.5* 7.7  ALBUMIN 4.5 4.0   Recent Labs  Lab 06/06/21 1513  LIPASE 46   No results for input(s): AMMONIA in the last 168 hours. Diabetic: No results for input(s): HGBA1C in the last 72 hours. No results for  input(s): GLUCAP in the last 168 hours. Cardiac Enzymes: No results for input(s): CKTOTAL, CKMB, CKMBINDEX, TROPONINI in the last 168 hours. No results for input(s): PROBNP in the last 8760 hours. Coagulation Profile: Recent Labs  Lab 06/07/21 0240  INR 1.0   Thyroid Function Tests: No results for input(s): TSH, T4TOTAL, FREET4, T3FREE, THYROIDAB in the last 72 hours. Lipid Profile: Recent Labs    06/07/21 0239  TRIG 142   Anemia Panel: No results for input(s): VITAMINB12, FOLATE, FERRITIN, TIBC, IRON, RETICCTPCT in the last 72 hours. Urine analysis:    Component Value Date/Time   COLORURINE YELLOW 06/06/2021 1510   APPEARANCEUR HAZY (A) 06/06/2021 1510   LABSPEC 1.015 06/06/2021 1510   PHURINE 5.5 06/06/2021 1510   GLUCOSEU NEGATIVE 06/06/2021 1510   HGBUR TRACE (A) 06/06/2021 1510   HGBUR negative 04/30/2010 1020   BILIRUBINUR NEGATIVE 06/06/2021 1510   BILIRUBINUR negative 09/06/2019 [place] 06/06/2021 1510  PROTEINUR NEGATIVE 06/06/2021 1510   UROBILINOGEN 0.2 09/06/2019 1649   UROBILINOGEN 0.2 03/15/2015 0508   NITRITE POSITIVE (A) 06/06/2021 1510   LEUKOCYTESUR NEGATIVE 06/06/2021 1510   Sepsis Labs: Invalid input(s): PROCALCITONIN, [place]  Microbiology: Recent Results (from the past 240 hour(s))  Resp Panel by RT-PCR (Flu A&B, Covid) Nasopharyngeal Swab     Status: None   Collection Time: 06/06/21 10:27 PM   Specimen: Nasopharyngeal Swab; Nasopharyngeal(NP) swabs in vial transport medium  Result Value Ref Range Status   SARS Coronavirus 2 by RT PCR NEGATIVE NEGATIVE Final    Comment: (NOTE) SARS-CoV-2 target nucleic acids are NOT DETECTED.  The SARS-CoV-2 RNA is generally detectable in upper respiratory specimens during the acute phase of infection. The lowest concentration of SARS-CoV-2 viral copies this assay can detect is 138 copies/mL. A negative result does not preclude SARS-Cov-2 infection and should not be used as the sole  basis for treatment or other patient management decisions. A negative result may occur with  improper specimen collection/handling, submission of specimen other than nasopharyngeal swab, presence of viral mutation(s) within the areas targeted by this assay, and inadequate number of viral copies(<138 copies/mL). A negative result must be combined with clinical observations, patient history, and epidemiological information. The expected result is Negative.  Fact Sheet for Patients:  EntrepreneurPulse.com.au  Fact Sheet for Healthcare Providers:  IncredibleEmployment.be  This test is no t yet approved or cleared by the [place] FDA and  has been authorized for detection and/or diagnosis of SARS-CoV-2 by FDA under an Emergency Use Authorization (EUA). This EUA will remain  in effect (meaning this test can be used) for the duration of the COVID-19 declaration under Section 564(b)(1) of the Act, 21 U.S.C.section 360bbb-3(b)(1), unless the authorization is terminated  or revoked sooner.       Influenza A by PCR NEGATIVE NEGATIVE Final   Influenza B by PCR NEGATIVE NEGATIVE Final    Comment: (NOTE) The Xpert Xpress SARS-CoV-2/FLU/RSV plus assay is intended as an aid in the diagnosis of influenza from Nasopharyngeal swab specimens and should not be used as a sole basis for treatment. Nasal washings and aspirates are unacceptable for Xpert Xpress SARS-CoV-2/FLU/RSV testing.  Fact Sheet for Patients: EntrepreneurPulse.com.au  Fact Sheet for Healthcare Providers: IncredibleEmployment.be  This test is not yet approved or cleared by the [place] FDA and has been authorized for detection and/or diagnosis of SARS-CoV-2 by FDA under an Emergency Use Authorization (EUA). This EUA will remain in effect (meaning this test can be used) for the duration of the COVID-19 declaration under Section 564(b)(1) of the Act,  21 U.S.C. section 360bbb-3(b)(1), unless the authorization is terminated or revoked.  Performed at [facility], [place] [address] [place], [place] [address]     Radiology Studies: CT ABDOMEN PELVIS WO CONTRAST  Result Date: 06/06/2021 CLINICAL DATA:  Left upper quadrant abdominal pain. History of chronic pancreatitis. EXAM: CT ABDOMEN AND PELVIS WITHOUT CONTRAST TECHNIQUE: Multidetector CT imaging of the abdomen and pelvis was performed following the standard protocol without IV contrast. RADIATION DOSE REDUCTION: This exam was performed according to the departmental dose-optimization program which includes automated exposure control, adjustment of the mA and/or kV according to patient size and/or use of iterative reconstruction technique. COMPARISON:  CT abdomen and pelvis 04/11/2021. FINDINGS: Lower chest: No acute abnormality. Hepatobiliary: No focal liver abnormality is seen. No gallstones, gallbladder wall thickening, or biliary dilatation. Pancreas: Diffuse pancreatic calcifications are again seen. There is mild inflammatory stranding surrounding the body  of the pancreas. There are 2 low-attenuation/cystic lesions in the body and tail the pancreas measuring 1.7 and 1.0 cm respectively. These have not significantly changed. Spleen: Normal in size without focal abnormality. Adrenals/Urinary Tract: Adrenal glands are unremarkable. Kidneys are normal, without renal calculi, focal lesion, or hydronephrosis. Bladder is unremarkable. Stomach/Bowel: Stomach is within normal limits. Appendix appears normal. No evidence of bowel wall thickening, distention, or inflammatory changes. There are scattered air-fluid levels throughout the small bowel. Vascular/Lymphatic: Aortic atherosclerosis. No enlarged abdominal or pelvic lymph nodes. Reproductive: Prostate is unremarkable. Other: No abdominal wall hernia or abnormality. No abdominopelvic ascites. Musculoskeletal: No acute or significant osseous findings.  IMPRESSION: 1. Mild acute pancreatitis. 2. 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 3. Air-fluid levels throughout small bowel loops. Findings are nonspecific, but can be seen with enteritis. Electronically Signed   By: Ronney Asters M.D.   On: 06/06/2021 22:04   DG Chest 2 View  Result Date: 06/06/2021 CLINICAL DATA:  Chest and abdominal pain. Nausea vomiting and diarrhea for 1 day. EXAM: CHEST - 2 VIEW COMPARISON:  03/23/2021. FINDINGS: Cardiac silhouette is normal in size. Normal mediastinal and hilar contours. Clear lungs.  No pleural effusion or pneumothorax. Skeletal structures are intact. IMPRESSION: No active cardiopulmonary disease. Electronically Signed   By: Lajean Manes M.D.   On: 06/06/2021 15:53       [facility] [place]  If 7PM-7AM, please contact night-coverage [email or web address] 06/07/2021, 12:42 PM

## 2021-06-07 NOTE — H&P (Signed)
History and Physical    PLEASE NOTE THAT DRAGON DICTATION SOFTWARE WAS USED IN THE CONSTRUCTION OF THIS NOTE.   [name] QMG:[id number] DOB: 1970/10/12 DOA: 06/06/2021  PCP: Pcp, No (will further assess) Patient coming from: home   I have personally briefly reviewed patient's old medical records in [place]  Chief Complaint: Abdominal pain  HPI: [name] is a 51 y.o. male with medical history significant for chronic alcohol abuse, acute pancreatitis, hypertension, chronic tobacco abuse, who is admitted to [facility] on 06/06/2021 with acute pancreatitis after presenting from home to [facility] ED complaining of abdominal pain.   The patient reports 1 to 2 days of progressive sharp, nonradiating epigastric discomfort, that has been constant over that timeframe, exacerbated by palpation over the epigastrium as well as in an immediate postprandial timeframe and attempting to eat or drink anything over the course of the last day.  He also notes associated intermittent nausea resulting in 2-3 episodes of nonbloody, nonbilious emesis, and resulting in significant decline in oral intake of both food and water over that timeframe.  No new onset of loose stool.  He also denies any recent melena or hematochezia.  Not associate any subjective fever, chills, rigors, or generalized myalgias.  Not associate with any dysuria, gross hematuria, or change urinary urgency/frequency.  Denies any associated rash or jaundice appearance.  Denies any associated chest pain, shortness of breath, palpitations, diaphoresis, presyncope, or syncope.  Confirms a history of chronic alcohol abuse, in which he consumes at least a sixpack of beer per day, has been consuming alcohol at this daily volume for the last few years, noting most recent alcohol consumed 2 days ago.  He acknowledges a history of recreational drug use in the form of cocaine, but states that it has been several months since  his last such utilization.  denies any recent change to his outpatient medication regimen, including no recent antibiotic use.  He also denies any use of thiazide diuretics, Lasix, Depakote, corticosteroids, DPP4- inhibitors (aka gliptins, like Januvia), GLP-1 agonists. Denies any recent abdominal surgery or ERCP. No recent trauma or travel. Denies any recent change in weight. No history of PUD.      ED Course:  Vital signs in the ED were notable for the following: Temperature max 99.3; heart rate initially in the 120s, which decreased in the low 100s following interval initiation of IV fluids; blood pressure 171/95; respiratory rate 15-24, oxygen saturation 98 to 100% on room air.  Labs were notable for the following: CMP notable for the following high-sensitivity troponin I 14.  CBC notable for white blood cell count 13,000.  Urinalysis notable for no white blood cells, rare bacteria, positive hyaline cast, will showing no RBCs and was negative for protein.  COVID-19/influenza PCR results are currently pending.  Creatinine 1.27 relative to most recent prior creatinine did one 0.98 on 05/24/2021, glucose 125, liver enzymes within normal limits.  Imaging and additional notable ED work-up: EKG shows sinus tachycardia, with PVCs x2, heart rate 123, normal intervals, and no evidence of T wave or ST changes, including no evidence of ST elevation.  Chest x-ray shows no evidence of acute cardiopulmonary process per CT abdomen/pelvis shows evidence suggestive of acute on chronic pancreatitis, as well as suspected pseudocyst within the pancreatic body/tail, demonstrating no evidence of gallstones, gallbladder wall thickening, pericholecystic fluid, or biliary dilation.  While in the ED, the following were administered: GI cocktail with viscous lidocaine x1, Dilaudid 1 mg IV  x2 doses, Zofran 4 mg IV x1, and a 2 L normal saline bolus.  Subsequently, the patient is admitted for further evaluation and management  of acute alcoholic pancreatitis, with presentation complicated by acute kidney injury.     Review of Systems: As per HPI otherwise 10 point review of systems negative.   Past Medical History:  Diagnosis Date   Alcoholism /alcohol abuse    Anemia    Anxiety    Arthritis    "knees; arms; elbows" (03/26/2015)   Asthma    Bipolar disorder (HCC)    Chronic bronchitis (HCC)    Chronic lower back pain    Chronic pancreatitis ([place])    Cocaine abuse ([place])    Depression    Family history of adverse reaction to anesthesia    "grandmother gets confused"   Femoral condyle fracture ([place]) 03/08/2014   left medial/notes 03/09/2014   GERD (gastroesophageal reflux disease)    H/O hiatal hernia    H/O suicide attempt 10/2012   High cholesterol    History of blood transfusion 10/2012   "when I tried to commit suicide"   History of stomach ulcers    Hypertension    Marijuana abuse, continuous    Migraine    "a few times/year" (03/26/2015)   Pneumonia 1990's X 3   PTSD (post-traumatic stress disorder)    Sickle cell trait ([place])    WPW (Wolff-Parkinson-White syndrome)    [name] 03/06/2013    Past Surgical History:  Procedure Laterality Date   BIOPSY  11/25/2017   Procedure: BIOPSY;  Surgeon: Arta Silence, MD;  Location: [place];  Service: Endoscopy;;   BIOPSY  10/14/2018   Procedure: BIOPSY;  Surgeon: Arta Silence, MD;  Location: [place];  Service: Endoscopy;;   CARDIAC CATHETERIZATION     CYST ENTEROSTOMY  01/02/2020   Procedure: CYST ASPIRATION;  Surgeon: Milus Banister, MD;  Location: [facility];  Service: Endoscopy;;   ESOPHAGOGASTRODUODENOSCOPY (EGD) WITH PROPOFOL N/A 11/25/2017   Procedure: ESOPHAGOGASTRODUODENOSCOPY (EGD) WITH PROPOFOL;  Surgeon: Arta Silence, MD;  Location: [place];  Service: Endoscopy;  Laterality: N/A;   ESOPHAGOGASTRODUODENOSCOPY (EGD) WITH PROPOFOL Left 10/14/2018   Procedure: ESOPHAGOGASTRODUODENOSCOPY (EGD) WITH PROPOFOL;  Surgeon:  Arta Silence, MD;  Location: [facility] ENDOSCOPY;  Service: Endoscopy;  Laterality: Left;   ESOPHAGOGASTRODUODENOSCOPY (EGD) WITH PROPOFOL N/A 11/14/2018   Procedure: ESOPHAGOGASTRODUODENOSCOPY (EGD) WITH PROPOFOL;  Surgeon: Laurence Spates, MD;  Location: [facility];  Service: Gastroenterology;  Laterality: N/A;   ESOPHAGOGASTRODUODENOSCOPY (EGD) WITH PROPOFOL N/A 01/02/2020   Procedure: ESOPHAGOGASTRODUODENOSCOPY (EGD) WITH PROPOFOL;  Surgeon: Milus Banister, MD;  Location: [facility];  Service: Endoscopy;  Laterality: N/A;   ESOPHAGOGASTRODUODENOSCOPY (EGD) WITH PROPOFOL N/A 10/25/2020   Procedure: ESOPHAGOGASTRODUODENOSCOPY (EGD) WITH PROPOFOL;  Surgeon: Rush Landmark Telford Nab., MD;  Location: [place];  Service: Gastroenterology;  Laterality: N/A;   EUS N/A 01/02/2020   Procedure: UPPER ENDOSCOPIC ULTRASOUND (EUS) RADIAL;  Surgeon: Milus Banister, MD;  Location: [facility];  Service: Endoscopy;  Laterality: N/A;   EYE SURGERY Left 1990's   "result of trauma"    FACIAL FRACTURE SURGERY Left 1990's   "result of trauma"    FLEXIBLE SIGMOIDOSCOPY N/A 10/25/2020   Procedure: FLEXIBLE SIGMOIDOSCOPY;  Surgeon: Irving Copas., MD;  Location: [place];  Service: Gastroenterology;  Laterality: N/A;   FRACTURE SURGERY     HEMOSTASIS CLIP PLACEMENT  10/25/2020   Procedure: HEMOSTASIS CLIP PLACEMENT;  Surgeon: Irving Copas., MD;  Location: [place];  Service: Gastroenterology;;   HERNIA REPAIR  LEFT HEART CATHETERIZATION WITH CORONARY ANGIOGRAM Right 03/07/2013   Procedure: LEFT HEART CATHETERIZATION WITH CORONARY ANGIOGRAM;  Surgeon: [name], MD;  Location: [place] CATH LAB;  Service: Cardiovascular;  Laterality: Right;   UMBILICAL HERNIA REPAIR     UPPER GASTROINTESTINAL ENDOSCOPY      Social History:  reports that he has been smoking cigarettes and e-cigarettes. He has a 36.00 pack-year smoking history. He has never used smokeless tobacco. He reports current  alcohol use. He reports current drug use. Drugs: Marijuana and Cocaine.   Allergies  Allergen Reactions   Robaxin [Methocarbamol] Other (See Comments)    "jumpy limbs"   Aspirin     Other reaction(s): Other (See Comments)   Shellfish-Derived Products Nausea And Vomiting   Trazodone And Nefazodone Other (See Comments)    Muscle spasms   Adhesive [Tape] Itching   Latex Itching   Toradol [Ketorolac Tromethamine] Other (See Comments)    Has ulcers; cannot have this   Contrast Media [Iodinated Contrast Media] Hives   Reglan [Metoclopramide] Other (See Comments)    Muscle spasms    Family History  Problem Relation Age of Onset   Hypertension Mother    Cirrhosis Father    Alcoholism Father    Hypertension Father    Melanoma Father    Hypertension Other    Coronary artery disease Other     Family history reviewed and not pertinent    Prior to Admission medications   Medication Sig Start Date End Date Taking? Authorizing Provider  acetaminophen (TYLENOL) 500 MG tablet Take 500 mg by mouth every 6 (six) hours as needed for moderate pain or headache.   Yes [provider]  albuterol (VENTOLIN HFA) 108 (90 Base) MCG/ACT inhaler Inhale 2 puffs into the lungs every 4 (four) hours as needed for wheezing or shortness of breath. 11/12/19  Yes [name], DO  cholecalciferol (VITAMIN D) 25 MCG tablet Take 1 tablet (1,000 Units total) by mouth daily. 10/30/20  Yes [name], MD  cyclobenzaprine (FLEXERIL) 10 MG tablet Take 10 mg by mouth 3 (three) times daily as needed for muscle spasms. 11/03/20  Yes [provider]  fluticasone furoate-vilanterol (BREO ELLIPTA) 200-25 MCG/INH AEPB Inhale 1 puff into the lungs daily. Patient taking differently: Inhale 1 puff into the lungs daily as needed (asthma). 11/12/19  Yes Noemi Chapel P, DO  gabapentin (NEURONTIN) 100 MG capsule Take 100 mg by mouth 3 (three) times daily. 05/17/21  Yes [provider]  lidocaine (XYLOCAINE)  2 % solution Use as directed 15 mLs in the mouth or throat every 6 (six) hours as needed for mouth pain. 11/21/20  Yes [name], [name], DO  lipase/protease/amylase (CREON) 36000 UNITS CPEP capsule Take 2 capsules (72,000 Units total) by mouth 3 (three) times daily with meals. For pancreatitis 11/05/19  Yes [name], [name], MD  magnesium oxide (MAG-OX) 400 (240 Mg) MG tablet Take 400 mg by mouth daily. 12/24/20  Yes [provider]  methocarbamol (ROBAXIN) 750 MG tablet Take 750 mg by mouth every 6 (six) hours as needed for muscle spasms. 05/17/21  Yes [provider]  metoprolol (TOPROL-XL) 200 MG 24 hr tablet Take 200 mg by mouth daily. 10/29/20  Yes [provider]  mirtazapine (REMERON) 15 MG tablet Take 15 mg by mouth at bedtime. 05/17/21  Yes [provider]  Multiple Vitamin (MULTIVITAMIN WITH MINERALS) TABS tablet Take 1 tablet by mouth daily. 09/06/17  Yes Bonnell Public, MD  omeprazole ([place])  40 MG capsule TAKE ONE CAPSULE BY MOUTH ONCE TO twice DAILY AS NEEDED Patient taking differently: Take 40 mg by mouth 2 (two) times daily as needed (acid reflex). 04/20/21  Yes [name], Willaim Rayas, MD  ondansetron (ZOFRAN-ODT) 4 MG disintegrating tablet Take 1 tablet (4 mg total) by mouth every 8 (eight) hours as needed for vomiting or nausea. 05/24/21  Yes Tilden Fossa, MD  OVER THE COUNTER MEDICATION Take 1 tablet by mouth daily. Iron supplement.   Yes [provider]  oxyCODONE-acetaminophen (PERCOCET/ROXICET) 5-325 MG tablet Take 1 tablet by mouth every 6 (six) hours as needed for moderate pain. 05/17/21  Yes [provider]  Polyethyl Glycol-Propyl Glycol (SYSTANE OP) Place 1 drop into both eyes daily as needed (dry eyes).   Yes [provider]  potassium chloride (KLOR-CON) 10 MEQ tablet Take 10 mEq by mouth daily as needed (low potassium).   Yes [provider]  sertraline (ZOLOFT) 100 MG tablet Take 1 tablet (100 mg  total) by mouth daily. 03/27/20  Yes Hoy Register, MD  sucralfate (CARAFATE) 1 g tablet Take 1 tablet (1 g total) by mouth 4 (four) times daily -  with meals and at bedtime. 04/11/21  Yes Rancour, Jeannett Senior, MD  sucralfate (CARAFATE) 1 GM/10ML suspension Take 10 MLs BY MOUTH FOUR TIMES DAILY with meals AND AT BEDTIME Patient taking differently: Take 1 g by mouth in the morning, at noon, in the evening, and at bedtime. 06/01/21  Yes [name], Willaim Rayas, MD  thiamine 100 MG tablet Take 1 tablet (100 mg total) by mouth daily. 10/16/18  Yes Rolly Salter, MD  verapamil (CALAN-SR) 120 MG CR tablet Take 120 mg by mouth at bedtime. 12/24/20  Yes [provider]  Vitamin D, Ergocalciferol, (DRISDOL) 1.25 MG (50000 UNIT) CAPS capsule Take 50,000 Units by mouth every Wednesday.   Yes [provider]  folic acid (FOLVITE) 1 MG tablet Take 1 tablet (1 mg total) by mouth daily. Patient not taking: Reported on 05/26/2021 11/26/17   Leroy Sea, MD  amitriptyline (ELAVIL) 25 MG tablet Take 1 tablet (25 mg total) by mouth at bedtime. Patient not taking: Reported on 08/08/2019 10/15/18 08/08/19  Rolly Salter, MD  promethazine (PHENERGAN) 25 MG tablet Take 1 tablet (25 mg total) by mouth every 6 (six) hours as needed for nausea or vomiting. Patient not taking: Reported on 08/08/2019 03/02/19 08/08/19  Liberty Handy, PA-C     Objective    Physical Exam: Vitals:   06/06/21 2141 06/06/21 2145 06/06/21 2200 06/06/21 2215  BP: (!) 174/118     Pulse: (!) 113 85  (!) 108  Resp: 16 18 (!) 29 (!) 22  Temp:      TempSrc:      SpO2: 98% (!) 82%  100%    General: appears to be stated age; alert, oriented Skin: warm, dry, no rash Head:  AT/[place] Mouth:  Oral mucosa membranes appear dry, normal dentition Neck: supple; trachea midline Heart:  RRR; did not appreciate any M/R/G Lungs: CTAB, did not appreciate any wheezes, rales, or rhonchi Abdomen: + BS; soft, ND, mild tenderness to palpation over  the epigastrium, in the absence of any guarding, rigidity, or rebound tenderness. Vascular: 2+ pedal pulses b/l; 2+ radial pulses b/l Extremities: no peripheral edema, no muscle wasting Neuro: strength and sensation intact in upper and lower extremities b/l    Labs on Admission: I have personally reviewed following labs and imaging studies  CBC: Recent Labs  Lab 06/06/21 1513  WBC 13.0*  HGB 13.0  HCT 39.4  MCV 90.2  PLT 578   Basic Metabolic Panel: Recent Labs  Lab 06/06/21 1513  NA 135  K 3.7  CL 102  CO2 17*  GLUCOSE 125*  BUN 10  CREATININE 1.27*  CALCIUM 9.3   GFR: Estimated Creatinine Clearance: 60.1 mL/min (A) (by C-G formula based on SCr of 1.27 mg/dL (H)). Liver Function Tests: Recent Labs  Lab 06/06/21 1513  AST 40  ALT 29  ALKPHOS 102  BILITOT 0.9  PROT 8.5*  ALBUMIN 4.5   Recent Labs  Lab 06/06/21 1513  LIPASE 46   No results for input(s): AMMONIA in the last 168 hours. Coagulation Profile: No results for input(s): INR, PROTIME in the last 168 hours. Cardiac Enzymes: No results for input(s): CKTOTAL, CKMB, CKMBINDEX, TROPONINI in the last 168 hours. BNP (last 3 results) No results for input(s): PROBNP in the last 8760 hours. HbA1C: No results for input(s): HGBA1C in the last 72 hours. CBG: No results for input(s): GLUCAP in the last 168 hours. Lipid Profile: No results for input(s): CHOL, HDL, LDLCALC, TRIG, CHOLHDL, LDLDIRECT in the last 72 hours. Thyroid Function Tests: No results for input(s): TSH, T4TOTAL, FREET4, T3FREE, THYROIDAB in the last 72 hours. Anemia Panel: No results for input(s): VITAMINB12, FOLATE, FERRITIN, TIBC, IRON, RETICCTPCT in the last 72 hours. Urine analysis:    Component Value Date/Time   COLORURINE YELLOW 06/06/2021 1510   APPEARANCEUR HAZY (A) 06/06/2021 1510   LABSPEC 1.015 06/06/2021 1510   PHURINE 5.5 06/06/2021 1510   GLUCOSEU NEGATIVE 06/06/2021 1510   HGBUR TRACE (A) 06/06/2021 1510   HGBUR  negative 04/30/2010 1020   BILIRUBINUR NEGATIVE 06/06/2021 1510   BILIRUBINUR negative 09/06/2019 1649   KETONESUR NEGATIVE 06/06/2021 1510   PROTEINUR NEGATIVE 06/06/2021 1510   UROBILINOGEN 0.2 09/06/2019 1649   UROBILINOGEN 0.2 03/15/2015 0508   NITRITE POSITIVE (A) 06/06/2021 1510   LEUKOCYTESUR NEGATIVE 06/06/2021 1510    Radiological Exams on Admission: CT ABDOMEN PELVIS WO CONTRAST  Result Date: 06/06/2021 CLINICAL DATA:  Left upper quadrant abdominal pain. History of chronic pancreatitis. EXAM: CT ABDOMEN AND PELVIS WITHOUT CONTRAST TECHNIQUE: Multidetector CT imaging of the abdomen and pelvis was performed following the standard protocol without IV contrast. RADIATION DOSE REDUCTION: This exam was performed according to the departmental dose-optimization program which includes automated exposure control, adjustment of the mA and/or kV according to patient size and/or use of iterative reconstruction technique. COMPARISON:  CT abdomen and pelvis 04/11/2021. FINDINGS: Lower chest: No acute abnormality. Hepatobiliary: No focal liver abnormality is seen. No gallstones, gallbladder wall thickening, or biliary dilatation. Pancreas: Diffuse pancreatic calcifications are again seen. There is mild inflammatory stranding surrounding the body of the pancreas. There are 2 low-attenuation/cystic lesions in the body and tail the pancreas measuring 1.7 and 1.0 cm respectively. These have not significantly changed. Spleen: Normal in size without focal abnormality. Adrenals/Urinary Tract: Adrenal glands are unremarkable. Kidneys are normal, without renal calculi, focal lesion, or hydronephrosis. Bladder is unremarkable. Stomach/Bowel: Stomach is within normal limits. Appendix appears normal. No evidence of bowel wall thickening, distention, or inflammatory changes. There are scattered air-fluid levels throughout the small bowel. Vascular/Lymphatic: Aortic atherosclerosis. No enlarged abdominal or pelvic lymph  nodes. Reproductive: Prostate is unremarkable. Other: No abdominal wall hernia or abnormality. No abdominopelvic ascites. Musculoskeletal: No acute or significant osseous findings. IMPRESSION: 1. Mild acute pancreatitis. 2. Stable findings compatible with chronic pancreatitis and presumed pseudocysts in the body and  tail. 3. Air-fluid levels throughout small bowel loops. Findings are nonspecific, but can be seen with enteritis. Electronically Signed   By: Ronney Asters M.D.   On: 06/06/2021 22:04   DG Chest 2 View  Result Date: 06/06/2021 CLINICAL DATA:  Chest and abdominal pain. Nausea vomiting and diarrhea for 1 day. EXAM: CHEST - 2 VIEW COMPARISON:  03/23/2021. FINDINGS: Cardiac silhouette is normal in size. Normal mediastinal and hilar contours. Clear lungs.  No pleural effusion or pneumothorax. Skeletal structures are intact. IMPRESSION: No active cardiopulmonary disease. Electronically Signed   By: Lajean Manes M.D.   On: 06/06/2021 15:53     EKG: Independently reviewed, with result as described above.    Assessment/Plan   Principal Problem:   Acute alcoholic pancreatitis Active Problems:   Tobacco abuse   Essential hypertension   Nausea & vomiting   AKI (acute kidney injury) (HCC)   Leukocytosis   Epigastric pain     #) Acute alcoholic pancreatitis: in the setting of presenting abdominal pain, nausea/vomiting, and presenting CT abdomen/pelvis which demonstrated findings suggestive of acute on chronic pancreatitis along with suspected pseudocyst, as further detailed above, in the absence of any associated evidence of necrosis, compressing mass, abscess. No evidence of associated infection, and patient appears hemodynamically stable. Appears to be alcohol-induced in etiology in the setting of reported alcohol consumption, as outlined above. Clinically, contribution from gallbladder pathology is felt to be less likely, particularly given results of aforementioned imaging as well as  presenting labs, which demonstrated no evidence to suggest an underlying cholestatic process. No other evidence of pharmacologic contribution at this time. Overall, initial approach to tx will involve aggressive IVF's and bowel rest with symptomatic management, as further described below.     Plan: NPO. LR $Remov'@125'axMAsI$  cc/h.  This is in addition to banana bag running at 75 cc/h, as further detailed below.  Prn IV Dilaudid. prn IV Zofran. Recheck CMP and serum magnesium levels tomorrow morning, with electrolyte supplementation as needed. Check ionized calcium and triglyceride levels.  Add on serum ethanol level.  Management of chronic alcohol abuse, as described below.        #) Chronic Alcohol Abuse: the patient reports typical daily alcohol consumption as outlined above, with most recent alcohol consumption reportedly occurring 2 days ago. Close monitoring for development of evidence of alcohol withdrawal, including close attention to trend in vital signs, as well as close monitoring of electrolytes, as described below.  No current evidence to suggest active alcohol withdrawal.   Plan: counseled the patient on the importance of reduction in alcohol consumption. Consult to transition of care team placed. Close monitoring of ensuing BP and HR via routine VS. Symptoms-based CIWA protocol with prn Ativan ordered. Seizure precautions. Telemetry. Add-on serum Mg level. Check serum phosphorus level. Repeat CMP in the morning. Check INR. Add-on serum ethanol level. UDS. Banana bag x1, with plan to reevaluate ability to tolerate p.o. in the morning to assist with determination of transition to oral thiamine/folic acid/multivitamin supplementation.       #) Leukocytosis: Mildly elevated presenting with a cell count of 13,000, without evidence of underlying infectious process, including urinalysis which is not consistent with UTI, while chest x-ray shows no evidence of acute cardiopulmonary process, while CT  abdomen/pelvis aside from showing evidence of acute on chronic pancreatitis, shows no evidence of underlying intra-abdominal/intrapelvic infection.  Of note, COVID-19/influenza PCR result currently pending.  Criteria not currently met for sepsis.  Rather, suspect this mild elevation is on the  basis of contribution from hemoconcentration given relative presenting dehydration setting of increased GI losses and diminished oral intake over the last few days, as well as potential inflammatory contribution in the setting of presenting acute pancreatitis.  Plan: Further evaluation management of acute pancreatitis, as above, including IV fluids, as outlined above.  P CBC differential tomorrow.  Follow-up result of COVID-19/[place] PCR.       #) AKI: Presenting serum creatinine 1.27 relative demonstration by value of 0.98 on 05/24/2021.  This appears prerenal in nature in the setting of dehydration as a consequence of diminished oral intake of water as well as increased GI losses in the form of nausea/vomiting as relates to presenting acute pancreatitis, as above.  Presenting urinalysis with microscopy shows positive hyaline casts, consistent with a picture of dehydration, will demonstrating no evidence of additional urinary casts, including no white blood cell or red blood cell casts, and was negative for urinary protein.   Plan: IV fluids, as above.  Monitor strict I's and O's and daily weights.  Tempt avoid nephrotoxic agents.  Add on random urine sodium as well as random urine creatinine.  Repeat BMP in the morning.        #) Essential Hypertension: documented h/o such, with outpatient antihypertensive regimen including metoprolol succinate, verapamil.  SBP's in the ED today: 170s.   Plan: Close monitoring of subsequent BP via routine VS. in the setting of current n.p.o. status as component of management for acute pancreatitis, will hold home and hypertensive medications for now.  As needed IV  labetalol.        #) Chronic tobacco abuse: Patient conveys that they are a current smoker, having smoked approximately 1 pack/day over the last 35 years.   Plan: Counseled the patient on the importance of complete smoking discontinuation.  Order placed for nicotine patch for use during this hospitalization.        DVT prophylaxis: SCD's   Code Status: Full code Family Communication: Case was discussed with the patient's mother, who is present at bedside. Disposition Plan: Per Rounding Team Consults called: none;  Admission status: Inpatient;  Warrants inpatient status on basis of need for further evaluation management of presenting acute pancreatitis, including need for IV fluids as well as prn IV antiemetics/prn IV analgesic intervention given the patient's current inability to tolerate p.o. in the setting of his acute pancreatitis.   PLEASE NOTE THAT DRAGON DICTATION SOFTWARE WAS USED IN THE CONSTRUCTION OF THIS NOTE.   [employer] DO [facility]  From [place]   06/07/2021, 12:42 AM

## 2021-06-08 DIAGNOSIS — E871 Hypo-osmolality and hyponatremia: Secondary | ICD-10-CM

## 2021-06-08 LAB — RENAL FUNCTION PANEL
Albumin: 3.3 g/dL — ABNORMAL LOW (ref 3.5–5.0)
Anion gap: 12 (ref 5–15)
BUN: 5 mg/dL — ABNORMAL LOW (ref 6–20)
CO2: 19 mmol/L — ABNORMAL LOW (ref 22–32)
Calcium: 8.8 mg/dL — ABNORMAL LOW (ref 8.9–10.3)
Chloride: 102 mmol/L (ref 98–111)
Creatinine, Ser: 0.74 mg/dL (ref 0.61–1.24)
GFR, Estimated: >60 mL/min (ref >60)
Glucose, Bld: 146 mg/dL — ABNORMAL HIGH (ref 70–99)
Phosphorus: 2.8 mg/dL (ref 2.5–4.6)
Potassium: 5.4 mmol/L — ABNORMAL HIGH (ref 3.5–5.1)
Sodium: 133 mmol/L — ABNORMAL LOW (ref 135–145)

## 2021-06-08 LAB — CBC
HCT: 32.4 % — ABNORMAL LOW (ref 39.0–52.0)
Hemoglobin: 10.6 g/dL — ABNORMAL LOW (ref 13.0–17.0)
MCH: 29.7 pg (ref 26.0–34.0)
MCHC: 32.7 g/dL (ref 30.0–36.0)
MCV: 90.8 fL (ref 80.0–100.0)
Platelets: 183 10*3/uL (ref 150–400)
RBC: 3.57 MIL/uL — ABNORMAL LOW (ref 4.22–5.81)
RDW: 17 % — ABNORMAL HIGH (ref 11.5–15.5)
WBC: 10.7 10*3/uL — ABNORMAL HIGH (ref 4.0–10.5)
nRBC: 0 % (ref 0.0–0.2)

## 2021-06-08 LAB — MAGNESIUM: Magnesium: 1.4 mg/dL — ABNORMAL LOW (ref 1.7–2.4)

## 2021-06-08 LAB — CALCIUM, IONIZED: Calcium, Ionized, Serum: 4.8 mg/dL (ref 4.5–5.6)

## 2021-06-08 LAB — LIPASE, BLOOD: Lipase: 27 U/L (ref 11–51)

## 2021-06-08 LAB — POTASSIUM: Potassium: 4.6 mmol/L (ref 3.5–5.1)

## 2021-06-08 MED ORDER — MAGNESIUM SULFATE 4 GM/100ML IV SOLN
4.0000 g | Freq: Once | INTRAVENOUS | Status: AC
  Administered 2021-06-08: 4 g via INTRAVENOUS
  Filled 2021-06-08: qty 100

## 2021-06-08 MED ORDER — GABAPENTIN 300 MG PO CAPS
300.0000 mg | ORAL_CAPSULE | Freq: Three times a day (TID) | ORAL | Status: DC
  Administered 2021-06-08 – 2021-06-10 (×6): 300 mg via ORAL
  Filled 2021-06-08 (×6): qty 1

## 2021-06-08 MED ORDER — OXYCODONE HCL 5 MG PO TABS
5.0000 mg | ORAL_TABLET | Freq: Four times a day (QID) | ORAL | Status: DC | PRN
  Administered 2021-06-08 – 2021-06-09 (×3): 5 mg via ORAL
  Filled 2021-06-08 (×3): qty 1

## 2021-06-08 MED ORDER — HYDROMORPHONE HCL 1 MG/ML IJ SOLN
0.5000 mg | INTRAMUSCULAR | Status: DC | PRN
  Administered 2021-06-08 – 2021-06-09 (×7): 0.5 mg via INTRAVENOUS
  Filled 2021-06-08 (×7): qty 1

## 2021-06-08 MED ORDER — CLONIDINE HCL 0.1 MG PO TABS
0.1000 mg | ORAL_TABLET | Freq: Three times a day (TID) | ORAL | Status: DC
Start: 2021-06-08 — End: 2021-06-08

## 2021-06-08 NOTE — TOC Initial Note (Signed)
Transition of Care [facility]) - Initial/Assessment Note    Patient Details  Name: [name] MRN: [id number] Date of Birth: 1971-02-10  Transition of Care [facility]) CM/SW Contact:    Milinda Antis, [place] Phone Number: 06/08/2021, 11:57 AM  Clinical Narrative:                 Patient admitted for acute pancreatitis with medical hx that includes chronic alcohol abuse, acute pancreatitis, hypertension, chronic tobacco abuse.  CSW met with the patient at bedside.  The patient is from home with his mother and reports having income from [facility].  The patient reports not having a PCP and would like one with [place].    CSW contacted [facility] in [place] ([address] [place]).  They do not have an opening for a new patient until May 24th, 2023 @ 13:20.  The patient will see Eldridge Abrahams, MD.  Due to the appointment with the patient's requested facility not being until May, [place] contacted [facility] and [facility] and scheduled a hospital follow up for the patient on July 13, 2021 at 09:30 as this was the earliest that the patient could be seen.  Expected Discharge Plan: Home/Self Care Barriers to Discharge: Continued Medical Work up   Patient Goals and CMS Choice Patient states their goals for this hospitalization and ongoing recovery are:: To go to SA rehab      Expected Discharge Plan and Services Expected Discharge Plan: Home/Self Care                                              Prior Living Arrangements/Services                       Activities of Daily Living Home Assistive Devices/Equipment: Blood pressure cuff, Eyeglasses, Scales ADL Screening (condition at time of admission) Patient's cognitive ability adequate to safely complete daily activities?: Yes Is the patient deaf or have difficulty hearing?: No Does the patient have difficulty seeing, even when wearing glasses/contacts?: No Does the patient have difficulty  concentrating, remembering, or making decisions?: No Patient able to express need for assistance with ADLs?: No Does the patient have difficulty dressing or bathing?: No Independently performs ADLs?: Yes (appropriate for developmental age) Does the patient have difficulty walking or climbing stairs?: Yes Weakness of Legs: None Weakness of Arms/Hands: None  Permission Sought/Granted                  Emotional Assessment              Admission diagnosis:  Acute alcoholic pancreatitis [R00.76] Alcohol induced acute pancreatitis without necrosis or infection [K85.20] Patient Active Problem List   Diagnosis Date Noted   Leukocytosis 06/07/2021   Epigastric pain 22/63/3354   Acute alcoholic pancreatitis 56/25/6389   Urinary retention    Protein-calorie malnutrition, severe 11/17/2020   Acute pancreatitis 11/15/2020   AKI (acute kidney injury) ([place]) 11/13/2018   Seizure ([place]) 11/13/2018   Acute on chronic pancreatitis ([place]) 09/28/2017   Abdominal pain 05/27/2017   Nausea & vomiting 03/18/2017   Normocytic anemia 12/05/2016   Alcohol use disorder, severe, dependence ([place]) 07/25/2016   Cocaine use disorder, severe, dependence ([place]) 07/25/2016   Major depressive disorder, recurrent severe without psychotic features ([place]) 07/20/2016   Chronic pancreatitis ([place]) 05/18/2015   Essential  hypertension 02/06/2014   Mood disorder ([place]) 02/06/2014   Pancreatic pseudocyst/cyst 11/25/2013   [name] White pattern seen on electrocardiogram 10/03/2012   Tobacco abuse 03/23/2007   PCP:  Merryl Hacker, No Pharmacy:   [facility] [place] [address] Phone: [phone] Fax: [phone]  [facility] [place], [address] [place] [address] Phone: [phone] Fax: [phone]     Social Determinants of Health (SDOH) Interventions    Readmission Risk  Interventions Readmission Risk Prevention Plan 06/08/2021 08/29/2019 10/15/2018  Transportation Screening Complete Complete Complete  Medication Review [employer]) Referral to Pharmacy Complete Complete  PCP or Specialist appointment within 3-5 days of discharge Not Complete Complete Complete  PCP/Specialist Appt Not Complete comments patient not ready to d/c - -  [place] or [place] Complete Complete Complete  SW Recovery Care/Counseling Consult Complete Complete Complete  Palliative Care Screening Not Applicable Not Applicable Not [place] Not Applicable Not Applicable Not Applicable  Some recent data might be hidden

## 2021-06-08 NOTE — TOC CAGE-AID Note (Signed)
Transition of Care [facility]) - CAGE-AID Screening   Patient Details  Name: [name] MRN: [id number] Date of Birth: 09-Aug-1970  Transition of [facility]) CM/SW Contact:    Milinda Antis, [place] Phone Number: 06/08/2021, 11:17 AM   Clinical Narrative:  CSW received consult for SA and met with the client at bedside.  Patient expressed a desire to abstain from substance use and was receptive to information and resources provided.    CAGE-AID Screening:    Have You Ever Felt You Ought to Cut Down on Your Drinking or Drug Use?: Yes Have People Annoyed You By Critizing Your Drinking Or Drug Use?: Yes   Have You Ever Had a Drink or Used Drugs First Thing In The Morning to Steady Your Nerves or to Get Rid of a Hangover?: Yes    Substance Abuse Education Offered: Yes  Substance abuse interventions: Patient Counseling, Scientist, clinical (histocompatibility and immunogenetics)

## 2021-06-08 NOTE — Progress Notes (Signed)
PROGRESS NOTE  [name] HGD:[id number] DOB: [birth year]   PCP: Pcp, No  Patient is from: Home  DOA: 06/06/2021 LOS: 2  Chief complaints:  Chief Complaint  Patient presents with   Chest Pain     Brief Narrative / Interim history: 51 year old M with PMH of chronic alcohol abuse, alcoholic pancreatitis, HTN, chronic abdominal pain, tobacco use disorder and mood disorder presenting with epigastric abdominal pain, nausea and 2-3 episodes of nonbloody nonbilious emesis for 1 to 2 days, and admitted for acute on chronic alcoholic pancreatitis with pseudocyst.  Lipase within normal.  CT abdomen and pelvis concerning for acute on chronic pancreatitis with suspected pseudocyst within the pancreatic body/tail.  Started on IV fluid, IV analgesics and antiemetics and admitted.  Subjective: Seen and examined earlier this morning.  No major events overnight of this morning.  Continues to endorse significant abdominal pain across his upper abdomen radiating to his back.  He describes the pain as sharp.  Rates his pain 8/10.  He reports some nausea.  He also endorses small emesis last night.  He reports seeing spots.  Denies auditory hallucination.  Objective: Vitals:   06/08/21 0327 06/08/21 0332 06/08/21 0514 06/08/21 0915  BP: (!) 159/110 (!) 165/119 (!) 150/99 (!) 143/101  Pulse: 99 99 (!) 107 93  Resp:   17   Temp:   99.7 F (37.6 C)   TempSrc:   Oral   SpO2:   97% 92%  Weight:   63.7 kg   Height:        Examination:  GENERAL: No apparent distress.  Nontoxic. HEENT: MMM.  Vision and hearing grossly intact.  NECK: Supple.  No apparent JVD.  RESP: 92% on RA.  No IWOB.  Fair aeration bilaterally. CVS:  RRR. Heart sounds normal.  ABD/GI/GU: BS+. Abd soft.  Very mild TTP over epigastric area with distraction. MSK/EXT:  Moves extremities. No apparent deformity. No edema.  SKIN: no apparent skin lesion or wound NEURO: Awake and alert. Oriented appropriately.  Small asterixis.   No apparent focal neuro deficit. PSYCH: Calm. Normal affect.   Procedures:  None  Microbiology summarized: [id number] and influenza PCR nonreactive.  Assessment & Plan: Acute on chronic alcoholic pancreatitis with pseudocyst-admits to drinking about 6 beers a day.  Last drink 3 days POA.  Lipase within normal.  CT abdomen and pelvis concerning for acute on chronic pancreatitis with suspected pseudocyst in the body and tail of pancreas.  Continues to endorse significant pain.  Rates the pain 8/10.  He also have chronic abdominal pain which complicates the matter. -Advance to full liquid diet. -Decrease IV fluid to 75 cc an hour -Add p.o. oxycodone 5 mg every every 6 hours as needed moderate pain -Decrease IV Dilaudid to 0.5 mg every 3 hours as needed for severe pain -Continue IV antiemetics -Encourage alcohol cessation -TOC consulted for resources.  Alcohol abuse: No withdrawal symptoms.  Drinks about 6 beers a day.  Last drink 3 days POA.  Latest CIWA score elevated to 19 although most of these are subjective -Continue CIWA monitoring with as needed Ativan -Increase home gabapentin to 300 mg 3 times daily. -Continue vitamins -TOC consulted for resources.  AKI: Resolved. Recent Labs    12/02/20 0701 01/14/21 1706 02/24/21 0127 03/23/21 2139 04/10/21 2318 04/11/21 0509 05/24/21 0408 06/06/21 1513 06/07/21 0240 06/08/21 0435  BUN 9 15 9 8 20 16 9 10 8  5*  CREATININE 0.56* 1.05 0.71 0.87 1.35* 1.20 0.98 1.27* 0.78 0.74  -Monitor  Essential hypertension/sinus tachycardia: On metoprolol and verapamil outpatient. -Pain control as above. -Continue home metoprolol. -Continue holding home Cardizem. -Labetalol as needed  Hypokalemia/hypomagnesemia: Hypokalemia resolved.  Mg 1.4. -IV magnesium sulfate 4 g x 1 -Recheck in the morning  Mood disorder/chronic abdominal pain: Patient reports history of depression and medicating himself with alcohol.  Previously followed at [facility].   Currently denies suicidal or homicidal ideation. -Continue home Zoloft and Remeron. -Increase home gabapentin to 300 mg 3 times daily -TOC consulted for outpatient psychiatry referral  Tobacco use disorder: Smokes about a pack a day.   -Encouraged cessation. -Nicotine patch.  Leukocytosis/bandemia: Likely demargination in the setting of acute pancreatitis.  Improved without antibiotics. -Recheck in the morning  Hyponatremia: Likely beer potomania or from LR.  Non-anion gap metabolic acidosis: Likely from IV fluid. -Recheck in the morning   Body mass index is 21.35 kg/m.         DVT prophylaxis:  SCDs Start: 06/06/21 2249  Code Status: Full code Family Communication: Patient and/or RN. Available if any question.  Level of care: Telemetry Medical.   Status is: Inpatient  Remains inpatient appropriate because: Acute on chronic alcoholic pancreatitis requiring IV pain medication, IV fluid, and significant electrolyte derangements   Final disposition: Likely home in the next 24 to 48 hours.    Consultants:  None   Sch Meds:  Scheduled Meds:  fluticasone furoate-vilanterol  1 puff Inhalation Daily   folic acid  1 mg Oral Daily   gabapentin  100 mg Oral TID   lipase/protease/amylase  72,000 Units Oral TID WC   metoprolol  200 mg Oral Daily   mirtazapine  15 mg Oral QHS   multivitamin with minerals  1 tablet Oral Daily   nicotine  14 mg Transdermal Daily   sertraline  100 mg Oral Daily   sucralfate  1 g Oral TID WC & HS   thiamine  100 mg Oral Daily   Or   thiamine  100 mg Intravenous Daily   Continuous Infusions:  lactated ringers 125 mL/hr at 06/08/21 0217   magnesium sulfate bolus IVPB 4 g (06/08/21 1055)   PRN Meds:.acetaminophen **OR** acetaminophen, HYDROmorphone (DILAUDID) injection, labetalol, LORazepam **OR** LORazepam, naLOXone (NARCAN)  injection, ondansetron (ZOFRAN) IV, oxyCODONE  Antimicrobials: Anti-infectives (From admission, onward)    None         I have personally reviewed the following labs and images: CBC: Recent Labs  Lab 06/06/21 1513 06/07/21 0240 06/08/21 0435  WBC 13.0* 10.7* 10.7*  NEUTROABS  --  6.5  --   HGB 13.0 11.9* 10.6*  HCT 39.4 36.9* 32.4*  MCV 90.2 92.3 90.8  PLT 273 226 183   BMP &GFR Recent Labs  Lab 06/06/21 1513 06/07/21 0240 06/08/21 0435 06/08/21 0852  NA 135 136 133*  --   K 3.7 3.2* 5.4* 4.6  CL 102 104 102  --   CO2 17* 20* 19*  --   GLUCOSE 125* 125* 146*  --   BUN 10 8 5*  --   CREATININE 1.27* 0.78 0.74  --   CALCIUM 9.3 8.8* 8.8*  --   MG  --  1.3* 1.4*  --   PHOS  --  3.1 2.8  --    Estimated Creatinine Clearance: 98.4 mL/min (by C-G formula based on SCr of 0.74 mg/dL). Liver & Pancreas: Recent Labs  Lab 06/06/21 1513 06/07/21 0240 06/08/21 0435  AST 40 28  --   ALT 29 22  --  ALKPHOS 102 96  --   BILITOT 0.9 1.1  --   PROT 8.5* 7.7  --   ALBUMIN 4.5 4.0 3.3*   Recent Labs  Lab 06/06/21 1513 06/08/21 0435  LIPASE 46 27   No results for input(s): AMMONIA in the last 168 hours. Diabetic: No results for input(s): HGBA1C in the last 72 hours. No results for input(s): GLUCAP in the last 168 hours. Cardiac Enzymes: No results for input(s): CKTOTAL, CKMB, CKMBINDEX, TROPONINI in the last 168 hours. No results for input(s): PROBNP in the last 8760 hours. Coagulation Profile: Recent Labs  Lab 06/07/21 0240  INR 1.0   Thyroid Function Tests: No results for input(s): TSH, T4TOTAL, FREET4, T3FREE, THYROIDAB in the last 72 hours. Lipid Profile: Recent Labs    06/07/21 0239  TRIG 142   Anemia Panel: No results for input(s): VITAMINB12, FOLATE, FERRITIN, TIBC, IRON, RETICCTPCT in the last 72 hours. Urine analysis:    Component Value Date/Time   COLORURINE YELLOW 06/06/2021 1510   APPEARANCEUR HAZY (A) 06/06/2021 1510   LABSPEC 1.015 06/06/2021 1510   PHURINE 5.5 06/06/2021 1510   GLUCOSEU NEGATIVE 06/06/2021 1510   HGBUR TRACE (A) 06/06/2021 1510    HGBUR negative 04/30/2010 1020   BILIRUBINUR NEGATIVE 06/06/2021 1510   BILIRUBINUR negative 09/06/2019 1649   KETONESUR NEGATIVE 06/06/2021 1510   PROTEINUR NEGATIVE 06/06/2021 1510   UROBILINOGEN 0.2 09/06/2019 1649   UROBILINOGEN 0.2 03/15/2015 0508   NITRITE POSITIVE (A) 06/06/2021 1510   LEUKOCYTESUR NEGATIVE 06/06/2021 1510   Sepsis Labs: Invalid input(s): PROCALCITONIN, [place]  Microbiology: Recent Results (from the past 240 hour(s))  Resp Panel by RT-PCR (Flu A&B, Covid) Nasopharyngeal Swab     Status: None   Collection Time: 06/06/21 10:27 PM   Specimen: Nasopharyngeal Swab; Nasopharyngeal(NP) swabs in vial transport medium  Result Value Ref Range Status   SARS Coronavirus 2 by RT PCR NEGATIVE NEGATIVE Final    Comment: (NOTE) SARS-CoV-2 target nucleic acids are NOT DETECTED.  The SARS-CoV-2 RNA is generally detectable in upper respiratory specimens during the acute phase of infection. The lowest concentration of SARS-CoV-2 viral copies this assay can detect is 138 copies/mL. A negative result does not preclude SARS-Cov-2 infection and should not be used as the sole basis for treatment or other patient management decisions. A negative result may occur with  improper specimen collection/handling, submission of specimen other than nasopharyngeal swab, presence of viral mutation(s) within the areas targeted by this assay, and inadequate number of viral copies(<138 copies/mL). A negative result must be combined with clinical observations, patient history, and epidemiological information. The expected result is Negative.  Fact Sheet for Patients:  EntrepreneurPulse.com.au  Fact Sheet for Healthcare Providers:  IncredibleEmployment.be  This test is no t yet approved or cleared by the [place] FDA and  has been authorized for detection and/or diagnosis of SARS-CoV-2 by FDA under an Emergency Use Authorization (EUA). This  EUA will remain  in effect (meaning this test can be used) for the duration of the COVID-19 declaration under Section 564(b)(1) of the Act, 21 U.S.C.section 360bbb-3(b)(1), unless the authorization is terminated  or revoked sooner.       Influenza A by PCR NEGATIVE NEGATIVE Final   Influenza B by PCR NEGATIVE NEGATIVE Final    Comment: (NOTE) The Xpert Xpress SARS-CoV-2/FLU/RSV plus assay is intended as an aid in the diagnosis of influenza from Nasopharyngeal swab specimens and should not be used as a sole basis for treatment. Nasal washings and aspirates are  unacceptable for Xpert Xpress SARS-CoV-2/FLU/RSV testing.  Fact Sheet for Patients: EntrepreneurPulse.com.au  Fact Sheet for Healthcare Providers: IncredibleEmployment.be  This test is not yet approved or cleared by the [place] FDA and has been authorized for detection and/or diagnosis of SARS-CoV-2 by FDA under an Emergency Use Authorization (EUA). This EUA will remain in effect (meaning this test can be used) for the duration of the COVID-19 declaration under Section 564(b)(1) of the Act, 21 U.S.C. section 360bbb-3(b)(1), unless the authorization is terminated or revoked.  Performed at [facility], [place] [address], [place], [place] [address]     Radiology Studies: No results found.     Arena Lindahl T. [place]  If 7PM-7AM, please contact night-coverage [email or web address] 06/08/2021, 12:08 PM

## 2021-06-09 ENCOUNTER — Inpatient Hospital Stay (HOSPITAL_COMMUNITY): Payer: Medicaid Other

## 2021-06-09 LAB — LIPASE, BLOOD: Lipase: 26 U/L (ref 11–51)

## 2021-06-09 LAB — CBC
HCT: 31 % — ABNORMAL LOW (ref 39.0–52.0)
Hemoglobin: 10.1 g/dL — ABNORMAL LOW (ref 13.0–17.0)
MCH: 30 pg (ref 26.0–34.0)
MCHC: 32.6 g/dL (ref 30.0–36.0)
MCV: 92 fL (ref 80.0–100.0)
Platelets: 193 10*3/uL (ref 150–400)
RBC: 3.37 MIL/uL — ABNORMAL LOW (ref 4.22–5.81)
RDW: 17.1 % — ABNORMAL HIGH (ref 11.5–15.5)
WBC: 10.5 10*3/uL (ref 4.0–10.5)
nRBC: 0 % (ref 0.0–0.2)

## 2021-06-09 LAB — COMPREHENSIVE METABOLIC PANEL
ALT: 19 U/L (ref 0–44)
AST: 28 U/L (ref 15–41)
Albumin: 3.4 g/dL — ABNORMAL LOW (ref 3.5–5.0)
Alkaline Phosphatase: 92 U/L (ref 38–126)
Anion gap: 9 (ref 5–15)
BUN: <5 mg/dL — ABNORMAL LOW (ref 6–20)
CO2: 26 mmol/L (ref 22–32)
Calcium: 8.9 mg/dL (ref 8.9–10.3)
Chloride: 96 mmol/L — ABNORMAL LOW (ref 98–111)
Creatinine, Ser: 0.79 mg/dL (ref 0.61–1.24)
GFR, Estimated: >60 mL/min (ref >60)
Glucose, Bld: 110 mg/dL — ABNORMAL HIGH (ref 70–99)
Potassium: 3.9 mmol/L (ref 3.5–5.1)
Sodium: 131 mmol/L — ABNORMAL LOW (ref 135–145)
Total Bilirubin: 0.6 mg/dL (ref 0.3–1.2)
Total Protein: 6.9 g/dL (ref 6.5–8.1)

## 2021-06-09 LAB — PHOSPHORUS: Phosphorus: 4.2 mg/dL (ref 2.5–4.6)

## 2021-06-09 LAB — MAGNESIUM: Magnesium: 1.7 mg/dL (ref 1.7–2.4)

## 2021-06-09 MED ORDER — OXYCODONE HCL 5 MG PO TABS
5.0000 mg | ORAL_TABLET | ORAL | Status: DC | PRN
  Administered 2021-06-09 – 2021-06-10 (×6): 5 mg via ORAL
  Filled 2021-06-09 (×7): qty 1

## 2021-06-09 NOTE — Progress Notes (Signed)
PROGRESS NOTE    [name]  ZJQ:[id number] DOB: [birth year] DOA: 06/06/2021 PCP: Pcp, No    Brief Narrative:  51 year old M with PMH of chronic alcohol abuse, alcoholic pancreatitis, HTN, chronic abdominal pain, tobacco use disorder and mood disorder presenting with epigastric abdominal pain, nausea and 2-3 episodes of nonbloody nonbilious emesis for 1 to 2 days, and admitted for acute on chronic alcoholic pancreatitis with pseudocyst.  Lipase within normal.  CT abdomen and pelvis concerning for acute on chronic pancreatitis with suspected pseudocyst within the pancreatic body/tail.  Started on IV fluid, IV analgesics and antiemetics and admitted.  2/1  Pt eating as soon as he saw me he c/o abd pain. During exam while distracting him he had no pain as I examined his abdomin. Later he was following me around hallway asking why he cant get iv dilaudid.  Explained to patient that we need to wean off of IV to p.o. pain meds for preparation for discharge.  Later was told by nursing patient asked case management for prescription for Dilaudid.  He has harassed me and nursing multiple times today over his IV Dilaudid.  Please call early a.m. 12 on CIWA protocol  Consultants:    Procedures:   Antimicrobials:      Subjective: No nausea or vomiting Objective: Vitals:   06/09/21 0454 06/09/21 0458 06/09/21 0900 06/09/21 1638  BP: 127/84  (!) 155/104 (!) 156/90  Pulse: 75  88 84  Resp: 17  16 18   Temp: 98.3 F (36.8 C)  98.7 F (37.1 C) 97.7 F (36.5 C)  TempSrc:      SpO2: 96%  100% 99%  Weight:  61.1 kg    Height:        Intake/Output Summary (Last 24 hours) at 06/09/2021 1639 Last data filed at 06/09/2021 1407 Gross per 24 hour  Intake 3023.68 ml  Output --  Net 3023.68 ml   Filed Weights   06/07/21 1600 06/08/21 0514 06/09/21 0458  Weight: 59 kg 63.7 kg 61.1 kg    Examination:  General exam: Appears calm and comfortable  Respiratory system: Clear to auscultation.  Respiratory effort normal. Cardiovascular system: S1 & S2 heard, RRR. No gallop Gastrointestinal system: Abdomen is nondistended, soft and nontender. No organomegaly or masses felt. Normal bowel sounds heard. Central nervous system: Alert and oriented.  Grossly intact  Extremities: No edema Psychiatry: Mood & affect appropriate.     Data Reviewed: I have personally reviewed following labs and imaging studies  CBC: Recent Labs  Lab 06/06/21 1513 06/07/21 0240 06/08/21 0435 06/09/21 0713  WBC 13.0* 10.7* 10.7* 10.5  NEUTROABS  --  6.5  --   --   HGB 13.0 11.9* 10.6* 10.1*  HCT 39.4 36.9* 32.4* 31.0*  MCV 90.2 92.3 90.8 92.0  PLT 273 226 183 240   Basic Metabolic Panel: Recent Labs  Lab 06/06/21 1513 06/07/21 0240 06/08/21 0435 06/08/21 0852 06/09/21 0713  NA 135 136 133*  --  131*  K 3.7 3.2* 5.4* 4.6 3.9  CL 102 104 102  --  96*  CO2 17* 20* 19*  --  26  GLUCOSE 125* 125* 146*  --  110*  BUN 10 8 5*  --  <5*  CREATININE 1.27* 0.78 0.74  --  0.79  CALCIUM 9.3 8.8* 8.8*  --  8.9  MG  --  1.3* 1.4*  --  1.7  PHOS  --  3.1 2.8  --  4.2   GFR: Estimated Creatinine Clearance:  94.4 mL/min (by C-G formula based on SCr of 0.79 mg/dL). Liver Function Tests: Recent Labs  Lab 06/06/21 1513 06/07/21 0240 06/08/21 0435 06/09/21 0713  AST 40 28  --  28  ALT 29 22  --  19  ALKPHOS 102 96  --  92  BILITOT 0.9 1.1  --  0.6  PROT 8.5* 7.7  --  6.9  ALBUMIN 4.5 4.0 3.3* 3.4*   Recent Labs  Lab 06/06/21 1513 06/08/21 0435 06/09/21 0713  LIPASE 46 27 26   No results for input(s): AMMONIA in the last 168 hours. Coagulation Profile: Recent Labs  Lab 06/07/21 0240  INR 1.0   Cardiac Enzymes: No results for input(s): CKTOTAL, CKMB, CKMBINDEX, TROPONINI in the last 168 hours. BNP (last 3 results) No results for input(s): PROBNP in the last 8760 hours. HbA1C: No results for input(s): HGBA1C in the last 72 hours. CBG: No results for input(s): GLUCAP in the last 168  hours. Lipid Profile: Recent Labs    06/07/21 0239  TRIG 142   Thyroid Function Tests: No results for input(s): TSH, T4TOTAL, FREET4, T3FREE, THYROIDAB in the last 72 hours. Anemia Panel: No results for input(s): VITAMINB12, FOLATE, FERRITIN, TIBC, IRON, RETICCTPCT in the last 72 hours. Sepsis Labs: No results for input(s): PROCALCITON, LATICACIDVEN in the last 168 hours.  Recent Results (from the past 240 hour(s))  Resp Panel by RT-PCR (Flu A&B, Covid) Nasopharyngeal Swab     Status: None   Collection Time: 06/06/21 10:27 PM   Specimen: Nasopharyngeal Swab; Nasopharyngeal(NP) swabs in vial transport medium  Result Value Ref Range Status   SARS Coronavirus 2 by RT PCR NEGATIVE NEGATIVE Final    Comment: (NOTE) SARS-CoV-2 target nucleic acids are NOT DETECTED.  The SARS-CoV-2 RNA is generally detectable in upper respiratory specimens during the acute phase of infection. The lowest concentration of SARS-CoV-2 viral copies this assay can detect is 138 copies/mL. A negative result does not preclude SARS-Cov-2 infection and should not be used as the sole basis for treatment or other patient management decisions. A negative result may occur with  improper specimen collection/handling, submission of specimen other than nasopharyngeal swab, presence of viral mutation(s) within the areas targeted by this assay, and inadequate number of viral copies(<138 copies/mL). A negative result must be combined with clinical observations, patient history, and epidemiological information. The expected result is Negative.  Fact Sheet for Patients:  EntrepreneurPulse.com.au  Fact Sheet for Healthcare Providers:  IncredibleEmployment.be  This test is no t yet approved or cleared by the [place] FDA and  has been authorized for detection and/or diagnosis of SARS-CoV-2 by FDA under an Emergency Use Authorization (EUA). This EUA will remain  in effect (meaning  this test can be used) for the duration of the COVID-19 declaration under Section 564(b)(1) of the Act, 21 U.S.C.section 360bbb-3(b)(1), unless the authorization is terminated  or revoked sooner.       Influenza A by PCR NEGATIVE NEGATIVE Final   Influenza B by PCR NEGATIVE NEGATIVE Final    Comment: (NOTE) The Xpert Xpress SARS-CoV-2/FLU/RSV plus assay is intended as an aid in the diagnosis of influenza from Nasopharyngeal swab specimens and should not be used as a sole basis for treatment. Nasal washings and aspirates are unacceptable for Xpert Xpress SARS-CoV-2/FLU/RSV testing.  Fact Sheet for Patients: EntrepreneurPulse.com.au  Fact Sheet for Healthcare Providers: IncredibleEmployment.be  This test is not yet approved or cleared by the [place] FDA and has been authorized for detection and/or diagnosis of  SARS-CoV-2 by FDA under an Emergency Use Authorization (EUA). This EUA will remain in effect (meaning this test can be used) for the duration of the COVID-19 declaration under Section 564(b)(1) of the Act, 21 U.S.C. section 360bbb-3(b)(1), unless the authorization is terminated or revoked.  Performed at [facility], [place] [address] [place], [place] [address]          Radiology Studies: No results found.      Scheduled Meds:  fluticasone furoate-vilanterol  1 puff Inhalation Daily   folic acid  1 mg Oral Daily   gabapentin  300 mg Oral TID   lipase/protease/amylase  72,000 Units Oral TID WC   metoprolol  200 mg Oral Daily   mirtazapine  15 mg Oral QHS   multivitamin with minerals  1 tablet Oral Daily   nicotine  14 mg Transdermal Daily   sertraline  100 mg Oral Daily   sucralfate  1 g Oral TID WC & HS   thiamine  100 mg Oral Daily   Or   thiamine  100 mg Intravenous Daily   Continuous Infusions:  lactated ringers Stopped (06/09/21 1355)    Assessment & Plan:   Principal Problem:   Acute alcoholic  pancreatitis Active Problems:   Tobacco abuse   Essential hypertension   Nausea & vomiting   AKI (acute kidney injury) (HCC)   Leukocytosis   Epigastric pain   Acute on chronic alcoholic pancreatitis with pseudocyst-admits to drinking about 6 beers a day.  Last drink 3 days POA.  Lipase within normal.  CT abdomen and pelvis concerning for acute on chronic pancreatitis with suspected pseudocyst in the body and tail of pancreas.  Continues to endorse significant pain.  Rates the pain 8/10.  He also have chronic abdominal pain which complicates the matter. 2/1 improving clinically.  Advancing diet as tolerated Discontinue IV Dilaudid Pain control with only p.o. pain meds Patient has been harassing staff and myself for IV Dilaudid.  Even ask case management for prescription for Dilaudid. Had a long discussion with him about why stopping dilaudid but he was following me around hallways still asking for it. Dc ivf since advancing diet and so far tolerated soft diet    Alcohol abuse:  Now withdrawing Continue on ciwa protocal Once stops withdrawal will dc home   AKI:  Improved with IV fluids   Essential hypertension/sinus tachycardia:  Stable Continue home meds   Hypokalemia/hypomagnesemia: Hypokalemia resolved.  Mg 1.4. -IV magnesium sulfate 4 g x 1 stable   Mood disorder/chronic abdominal pain: Patient reports history of depression and medicating himself with alcohol.  Previously followed at [facility].  Currently denies suicidal or homicidal ideation. -Continue home Zoloft and Remeron. -Increase home gabapentin to 300 mg 3 times daily -TOC consulted for outpatient psychiatry referral   Tobacco use disorder: Smokes about a pack a day.   -Encouraged cessation. -Nicotine patch.   Leukocytosis/bandemia: Likely demargination in the setting of acute pancreatitis.  Improved without antibiotics. -Recheck in the morning   Hyponatremia: Likely beer potomania or from LR.   Non-anion  gap metabolic acidosis: Likely from IV fluid. -Recheck in the morning     Body mass index is 21.35 kg/m.   DVT prophylaxis: Ambulating in hallways  Code Status: Full Family Communication: None at bedside Disposition Plan: Back to home hopefully in a.m. Status is: Inpatient Remains inpatient appropriate because: IV treatment.  Patient scoring on CIWA protocol.  LOS: 3 days   Time spent: 35 minutes with more than 50% on [place], MD [facility] Pager [phone]  If 7PM-7AM, please contact night-coverage 06/09/2021, 4:39 PM

## 2021-06-09 NOTE — Plan of Care (Signed)
°  Problem: Health Behavior/Discharge Planning: Goal: Ability to manage health-related needs will improve Outcome: Progressing   Problem: Clinical Measurements: Goal: Will remain free from infection Outcome: Progressing Goal: Diagnostic test results will improve Outcome: Progressing   Problem: Coping: Goal: Level of anxiety will decrease Outcome: Progressing

## 2021-06-10 MED ORDER — HYDROMORPHONE HCL 1 MG/ML IJ SOLN
0.5000 mg | Freq: Once | INTRAMUSCULAR | Status: AC
  Administered 2021-06-10: 0.5 mg via INTRAVENOUS
  Filled 2021-06-10: qty 1

## 2021-06-10 MED ORDER — THIAMINE HCL 100 MG PO TABS: 100.0000 mg | ORAL_TABLET | Freq: Every day | ORAL | 1 refills | Status: DC

## 2021-06-10 NOTE — Progress Notes (Signed)
DISCHARGE NOTE HOME [place] Kimberley [place] to be discharged Home per MD order. Discussed prescriptions and follow up appointments with the patient. Prescriptions given to patient; medication list explained in detail. Patient verbalized understanding.  Skin clean, dry and intact without evidence of skin break down, no evidence of skin tears noted. IV catheter discontinued intact. Site without signs and symptoms of complications. Dressing and pressure applied. Pt denies pain at the site currently. No complaints noted.  Patient free of lines, drains, and wounds.   An After Visit Summary (AVS) was printed and given to the patient. Patient escorted via wheelchair, and discharged home via private auto.  Dolores Hoose, RN

## 2021-06-10 NOTE — Plan of Care (Signed)
°  Problem: Health Behavior/Discharge Planning: Goal: Ability to manage health-related needs will improve Outcome: Adequate for Discharge   Problem: Clinical Measurements: Goal: Ability to maintain clinical measurements within normal limits will improve Outcome: Adequate for Discharge Goal: Will remain free from infection 06/10/2021 1049 by Dolores Hoose, RN Outcome: Adequate for Discharge 06/10/2021 0735 by Dolores Hoose, RN Outcome: Progressing Goal: Diagnostic test results will improve Outcome: Adequate for Discharge Goal: Respiratory complications will improve Outcome: Adequate for Discharge Goal: Cardiovascular complication will be avoided Outcome: Adequate for Discharge   Problem: Activity: Goal: Risk for activity intolerance will decrease Outcome: Adequate for Discharge   Problem: Nutrition: Goal: Adequate nutrition will be maintained 06/10/2021 1049 by Dolores Hoose, RN Outcome: Adequate for Discharge 06/10/2021 0735 by Dolores Hoose, RN Outcome: Progressing   Problem: Coping: Goal: Level of anxiety will decrease 06/10/2021 1049 by Dolores Hoose, RN Outcome: Adequate for Discharge 06/10/2021 0735 by Dolores Hoose, RN Outcome: Progressing

## 2021-06-10 NOTE — Discharge Summary (Signed)
[name] DOB: [birth year] DOA: 06/06/2021  PCP: Pcp, No  Admit date: 06/06/2021 Discharge date: 06/10/2021  Admitted From: Home Disposition: Home  Recommendations for Outpatient Follow-up:  Follow up with PCP in 1 week Please obtain BMP/CBC in one week Please follow up pain clinic as scheduled (per patient he has an appointment this month)    Discharge Condition:Stable CODE STATUS: Full Diet recommendation: Heart Healthy  Brief/Interim Summary: Per PYK:DXIPJAS [name] is a 51 y.o. male with medical history significant for chronic alcohol abuse, acute pancreatitis, hypertension, chronic tobacco abuse, who is admitted to [facility] on 06/06/2021 with acute pancreatitis after presenting from home to [facility] ED complaining of abdominal pain.  Confirms a history of chronic alcohol abuse, in which he consumes at least a sixpack of beer per day, has been consuming alcohol at this daily volume for the last few years, noting most recent alcohol consumed 2 days ago.  He acknowledges a history of recreational drug use in the form of cocaine, but states that it has been several months since his last such utilization. Vital signs in the ED were notable for the following: Temperature max 99.3; heart rate initially in the 120s, which decreased in the low 100s following interval initiation of IV fluids; blood pressure 171/95; respiratory rate 15-24, oxygen saturation 98 to 100% on room air.   Labs were notable for the following: CMP notable for the following high-sensitivity troponin I 14.  CBC notable for white blood cell count 13,000.  Urinalysis notable for no white blood cells, rare bacteria, positive hyaline cast, will showing no RBCs and was negative for protein.  COVID-19/influenza PCR negative. Had CT abd/pelvis revealing:1. Mild acute pancreatitis. 2. 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 3. Air-fluid  levels throughout small bowel loops. Findings are nonspecific, but can be seen with enteritis.   Patient was admitted to the hospital for further management .  Hospitalization behavior: Patient has hx/o drug abuse and apparently f/u with pain clinic. During his hospitalization he was continuously asking for iv Dilaudid. When his iv diluadid was discontinued, he asked case management if they can write a prescription for Dilaudid.  He also asked for IV Dilaudid prior to his discharge from MD.He followed MD up and down hallway asking about more diluadid, no distress or pain was noticed during his rapid ambulation. He ate spagetti and other meals without issues but as soon as staff /MD walk in he complained of pain and wanted IV Diluadid.Also per nsg, pt through his grits to the floor one morning and told them it was his vomit , but was noticed it was his grits.  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   Alcohol abuse:  Had some withdrawal Was placed on CIWA protocal Discussed with patient about alcohol and drug abuse cessation      AKI:  Improved with IV fluids   Essential hypertension/sinus tachycardia:  Stable Continue home meds   Hypokalemia/hypomagnesemia:  Replaced and stable      Mood disorder/chronic abdominal pain: Needs to follow-up with primary care as outpatient   Tobacco use disorder: Smokes about a pack a day.   -Encouraged cessation. -Nicotine patch during hospitalization, declined patch discharge.   Leukocytosis/bandemia:  Likely due to acute pancreatitis  Improved with antibiotics     Hyponatremia: Likely beer potomania  Encouraged to stop drinking   Non-anion gap  metabolic acidosis:  Improved with IV fluid      Body mass index is 21.35 kg/m.   ] \   Discharge Instructions  Discharge Instructions     Call MD for:  difficulty breathing,  headache or visual disturbances   Complete by: As directed    Call MD for:  extreme fatigue   Complete by: As directed    Call MD for:  persistant nausea and vomiting   Complete by: As directed    Call MD for:  severe uncontrolled pain   Complete by: As directed    Call MD for:  temperature >100.4   Complete by: As directed    Diet - low sodium heart healthy   Complete by: As directed    Diet - low sodium heart healthy   Complete by: As directed    Discharge instructions   Complete by: As directed    It has been a pleasure taking care of you!  You were hospitalized due to acute pancreatitis likely from alcohol.  We strongly recommend you stop drinking alcohol and smoking cigarettes.  Please continue full liquid diet for the next 2 to 3 days and advance to soft bland diet with pain as your guide.  Follow-up with your primary care doctor in 1 to 2 weeks or sooner if needed.  It is important that you quit smoking cigarettes.  You may use nicotine patch to help you quit smoking.  Nicotine patch is available over-the-counter.  You may also discuss other options to help you quit smoking with your primary care doctor. You can also talk to professional counselors at 1-800-QUIT-NOW [phone]) for free smoking cessation counseling.     Take care,   Discharge instructions   Complete by: As directed    Stop alcohol use   Increase activity slowly   Complete by: As directed    Increase activity slowly   Complete by: As directed       Allergies as of 06/10/2021       Reactions   Robaxin [methocarbamol] Other (See Comments)   "jumpy limbs"   Aspirin    Other reaction(s): Other (See Comments)   Shellfish-derived Products Nausea And Vomiting   Trazodone And Nefazodone Other (See Comments)   Muscle spasms   Adhesive [tape] Itching   Latex Itching   Toradol [ketorolac Tromethamine] Other (See Comments)   Has ulcers; cannot have this   Contrast Media [iodinated Contrast Media] Hives    Reglan [metoclopramide] Other (See Comments)   Muscle spasms        Medication List     STOP taking these medications    cyclobenzaprine 10 MG tablet Commonly known as: FLEXERIL   potassium chloride 10 MEQ tablet Commonly known as: KLOR-CON M   verapamil 120 MG CR tablet Commonly known as: CALAN-SR       TAKE these medications    acetaminophen 500 MG tablet Commonly known as: TYLENOL Take 500 mg by mouth every 6 (six) hours as needed for moderate pain or headache.   albuterol 108 (90 Base) MCG/ACT inhaler Commonly known as: VENTOLIN HFA Inhale 2 puffs into the lungs every 4 (four) hours as needed for wheezing or shortness of breath.   Breo Ellipta 200-25 MCG/ACT Aepb Generic drug: fluticasone furoate-vilanterol Inhale 1 puff into the lungs daily. What changed:  when to take this reasons to take this   folic acid 1 MG tablet Commonly known as: FOLVITE Take 1 tablet (1 mg total) by mouth daily.  gabapentin 100 MG capsule Commonly known as: NEURONTIN Take 100 mg by mouth 3 (three) times daily.   lidocaine 2 % solution Commonly known as: XYLOCAINE Use as directed 15 mLs in the mouth or throat every 6 (six) hours as needed for mouth pain.   lipase/protease/amylase 36000 UNITS Cpep capsule Commonly known as: CREON Take 2 capsules (72,000 Units total) by mouth 3 (three) times daily with meals. For pancreatitis   magnesium oxide 400 (240 Mg) MG tablet Commonly known as: MAG-OX Take 400 mg by mouth daily.   methocarbamol 750 MG tablet Commonly known as: ROBAXIN Take 750 mg by mouth every 6 (six) hours as needed for muscle spasms.   metoprolol 200 MG 24 hr tablet Commonly known as: TOPROL-XL Take 200 mg by mouth daily.   mirtazapine 15 MG tablet Commonly known as: REMERON Take 15 mg by mouth at bedtime.   multivitamin with minerals Tabs tablet Take 1 tablet by mouth daily.   omeprazole 40 MG capsule Commonly known as: PRILOSEC TAKE ONE CAPSULE BY  MOUTH ONCE TO twice DAILY AS NEEDED What changed: See the new instructions.   ondansetron 4 MG disintegrating tablet Commonly known as: ZOFRAN-ODT Take 1 tablet (4 mg total) by mouth every 8 (eight) hours as needed for vomiting or nausea.   OVER THE COUNTER MEDICATION Take 1 tablet by mouth daily. Iron supplement.   oxyCODONE-acetaminophen 5-325 MG tablet Commonly known as: PERCOCET/ROXICET Take 1 tablet by mouth every 6 (six) hours as needed for moderate pain.   sertraline 100 MG tablet Commonly known as: ZOLOFT Take 1 tablet (100 mg total) by mouth daily.   sucralfate 1 g tablet Commonly known as: Carafate Take 1 tablet (1 g total) by mouth 4 (four) times daily -  with meals and at bedtime. What changed: Another medication with the same name was changed. Make sure you understand how and when to take each.   sucralfate 1 GM/10ML suspension Commonly known as: CARAFATE Take 10 MLs BY MOUTH FOUR TIMES DAILY with meals AND AT BEDTIME What changed: See the new instructions.   SYSTANE OP Place 1 drop into both eyes daily as needed (dry eyes).   thiamine 100 MG tablet Take 1 tablet (100 mg total) by mouth daily.   Vitamin D (Ergocalciferol) 1.25 MG (50000 UNIT) Caps capsule Commonly known as: DRISDOL Take 50,000 Units by mouth every Wednesday.   Vitamin D3 25 MCG tablet Commonly known as: Vitamin D Take 1 tablet (1,000 Units total) by mouth daily.        Allergies  Allergen Reactions   Robaxin [Methocarbamol] Other (See Comments)    "jumpy limbs"   Aspirin     Other reaction(s): Other (See Comments)   Shellfish-Derived Products Nausea And Vomiting   Trazodone And Nefazodone Other (See Comments)    Muscle spasms   Adhesive [Tape] Itching   Latex Itching   Toradol [Ketorolac Tromethamine] Other (See Comments)    Has ulcers; cannot have this   Contrast Media [Iodinated Contrast Media] Hives   Reglan [Metoclopramide] Other (See Comments)    Muscle spasms     Consultations:    Procedures/Studies: CT ABDOMEN PELVIS WO CONTRAST  Result Date: 06/06/2021 CLINICAL DATA:  Left upper quadrant abdominal pain. History of chronic pancreatitis. EXAM: CT ABDOMEN AND PELVIS WITHOUT CONTRAST TECHNIQUE: Multidetector CT imaging of the abdomen and pelvis was performed following the standard protocol without IV contrast. RADIATION DOSE REDUCTION: This exam was performed according to the departmental dose-optimization program which includes automated  exposure control, adjustment of the mA and/or kV according to patient size and/or use of iterative reconstruction technique. COMPARISON:  CT abdomen and pelvis 04/11/2021. FINDINGS: Lower chest: No acute abnormality. Hepatobiliary: No focal liver abnormality is seen. No gallstones, gallbladder wall thickening, or biliary dilatation. Pancreas: Diffuse pancreatic calcifications are again seen. There is mild inflammatory stranding surrounding the body of the pancreas. There are 2 low-attenuation/cystic lesions in the body and tail the pancreas measuring 1.7 and 1.0 cm respectively. These have not significantly changed. Spleen: Normal in size without focal abnormality. Adrenals/Urinary Tract: Adrenal glands are unremarkable. Kidneys are normal, without renal calculi, focal lesion, or hydronephrosis. Bladder is unremarkable. Stomach/Bowel: Stomach is within normal limits. Appendix appears normal. No evidence of bowel wall thickening, distention, or inflammatory changes. There are scattered air-fluid levels throughout the small bowel. Vascular/Lymphatic: Aortic atherosclerosis. No enlarged abdominal or pelvic lymph nodes. Reproductive: Prostate is unremarkable. Other: No abdominal wall hernia or abnormality. No abdominopelvic ascites. Musculoskeletal: No acute or significant osseous findings. IMPRESSION: 1. Mild acute pancreatitis. 2. 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 3.  Air-fluid levels throughout small bowel loops. Findings are nonspecific, but can be seen with enteritis. Electronically Signed   By: Ronney Asters M.D.   On: 06/06/2021 22:04   DG Chest 2 View  Result Date: 06/06/2021 CLINICAL DATA:  Chest and abdominal pain. Nausea vomiting and diarrhea for 1 day. EXAM: CHEST - 2 VIEW COMPARISON:  03/23/2021. FINDINGS: Cardiac silhouette is normal in size. Normal mediastinal and hilar contours. Clear lungs.  No pleural effusion or pneumothorax. Skeletal structures are intact. IMPRESSION: No active cardiopulmonary disease. Electronically Signed   By: Lajean Manes M.D.   On: 06/06/2021 15:53   DG Abd 1 View  Result Date: 06/09/2021 CLINICAL DATA:  Generalized abdominal pain common known pancreatitis EXAM: ABDOMEN - 1 VIEW COMPARISON:  CT from 06/06/2021 FINDINGS: Scattered large and small bowel gas is noted. No obstructive changes are seen. No free air is noted. Scattered calcifications in the pancreas are again noted. No bony abnormality is seen. IMPRESSION: No acute abnormality noted. Electronically Signed   By: Inez Catalina M.D.   On: 06/09/2021 23:50      Subjective: No new complaints.  No nausea or vomiting.  Discharge Exam: Vitals:   06/10/21 0450 06/10/21 0918  BP: (!) 144/99 117/69  Pulse: 73 85  Resp: 18 18  Temp: 97.9 F (36.6 C) 98.3 F (36.8 C)  SpO2: 98% 96%   Vitals:   06/09/21 1638 06/09/21 2109 06/10/21 0450 06/10/21 0918  BP: (!) 156/90 (!) 161/91 (!) 144/99 117/69  Pulse: 84 78 73 85  Resp: 18 18 18 18   Temp: 97.7 F (36.5 C) 97.8 F (36.6 C) 97.9 F (36.6 C) 98.3 F (36.8 C)  TempSrc:  Oral Oral   SpO2: 99% 100% 98% 96%  Weight:      Height:        General: Pt is alert, awake, not in acute distress Cardiovascular: RRR, S1/S2 +, no rubs, no gallops Respiratory: CTA bilaterally, no wheezing, no rhonchi Abdominal: Soft, NT, ND, bowel sounds + Extremities: no edema, no cyanosis    The results of significant diagnostics  from this hospitalization (including imaging, microbiology, ancillary and laboratory) are listed below for reference.     Microbiology: Recent Results (from the past 240 hour(s))  Resp Panel by RT-PCR (Flu A&B, Covid) Nasopharyngeal Swab     Status: None   Collection Time: 06/06/21  10:27 PM   Specimen: Nasopharyngeal Swab; Nasopharyngeal(NP) swabs in vial transport medium  Result Value Ref Range Status   SARS Coronavirus 2 by RT PCR NEGATIVE NEGATIVE Final    Comment: (NOTE) SARS-CoV-2 target nucleic acids are NOT DETECTED.  The SARS-CoV-2 RNA is generally detectable in upper respiratory specimens during the acute phase of infection. The lowest concentration of SARS-CoV-2 viral copies this assay can detect is 138 copies/mL. A negative result does not preclude SARS-Cov-2 infection and should not be used as the sole basis for treatment or other patient management decisions. A negative result may occur with  improper specimen collection/handling, submission of specimen other than nasopharyngeal swab, presence of viral mutation(s) within the areas targeted by this assay, and inadequate number of viral copies(<138 copies/mL). A negative result must be combined with clinical observations, patient history, and epidemiological information. The expected result is Negative.  Fact Sheet for Patients:  EntrepreneurPulse.com.au  Fact Sheet for Healthcare Providers:  IncredibleEmployment.be  This test is no t yet approved or cleared by the [place] FDA and  has been authorized for detection and/or diagnosis of SARS-CoV-2 by FDA under an Emergency Use Authorization (EUA). This EUA will remain  in effect (meaning this test can be used) for the duration of the COVID-19 declaration under Section 564(b)(1) of the Act, 21 U.S.C.section 360bbb-3(b)(1), unless the authorization is terminated  or revoked sooner.       Influenza A by PCR NEGATIVE NEGATIVE  Final   Influenza B by PCR NEGATIVE NEGATIVE Final    Comment: (NOTE) The Xpert Xpress SARS-CoV-2/FLU/RSV plus assay is intended as an aid in the diagnosis of influenza from Nasopharyngeal swab specimens and should not be used as a sole basis for treatment. Nasal washings and aspirates are unacceptable for Xpert Xpress SARS-CoV-2/FLU/RSV testing.  Fact Sheet for Patients: EntrepreneurPulse.com.au  Fact Sheet for Healthcare Providers: IncredibleEmployment.be  This test is not yet approved or cleared by the [place] FDA and has been authorized for detection and/or diagnosis of SARS-CoV-2 by FDA under an Emergency Use Authorization (EUA). This EUA will remain in effect (meaning this test can be used) for the duration of the COVID-19 declaration under Section 564(b)(1) of the Act, 21 U.S.C. section 360bbb-3(b)(1), unless the authorization is terminated or revoked.  Performed at [facility], [place] [address] [place], [place] [address]      Labs: BNP (last 3 results) No results for input(s): BNP in the last 8760 hours. Basic Metabolic Panel: Recent Labs  Lab 06/06/21 1513 06/07/21 0240 06/08/21 0435 06/08/21 0852 06/09/21 0713  NA 135 136 133*  --  131*  K 3.7 3.2* 5.4* 4.6 3.9  CL 102 104 102  --  96*  CO2 17* 20* 19*  --  26  GLUCOSE 125* 125* 146*  --  110*  BUN 10 8 5*  --  <5*  CREATININE 1.27* 0.78 0.74  --  0.79  CALCIUM 9.3 8.8* 8.8*  --  8.9  MG  --  1.3* 1.4*  --  1.7  PHOS  --  3.1 2.8  --  4.2   Liver Function Tests: Recent Labs  Lab 06/06/21 1513 06/07/21 0240 06/08/21 0435 06/09/21 0713  AST 40 28  --  28  ALT 29 22  --  19  ALKPHOS 102 96  --  92  BILITOT 0.9 1.1  --  0.6  PROT 8.5* 7.7  --  6.9  ALBUMIN 4.5 4.0 3.3* 3.4*   Recent Labs  Lab 06/06/21 1513 06/08/21 0435 06/09/21 0713  LIPASE 46 27 26   No results for input(s): AMMONIA in the last 168 hours. CBC: Recent Labs  Lab  06/06/21 1513 06/07/21 0240 06/08/21 0435 06/09/21 0713  WBC 13.0* 10.7* 10.7* 10.5  NEUTROABS  --  6.5  --   --   HGB 13.0 11.9* 10.6* 10.1*  HCT 39.4 36.9* 32.4* 31.0*  MCV 90.2 92.3 90.8 92.0  PLT 273 226 183 193   Cardiac Enzymes: No results for input(s): CKTOTAL, CKMB, CKMBINDEX, TROPONINI in the last 168 hours. BNP: Invalid input(s): POCBNP CBG: No results for input(s): GLUCAP in the last 168 hours. D-Dimer No results for input(s): DDIMER in the last 72 hours. Hgb A1c No results for input(s): HGBA1C in the last 72 hours. Lipid Profile No results for input(s): CHOL, HDL, LDLCALC, TRIG, CHOLHDL, LDLDIRECT in the last 72 hours. Thyroid function studies No results for input(s): TSH, T4TOTAL, T3FREE, THYROIDAB in the last 72 hours.  Invalid input(s): FREET3 Anemia work up No results for input(s): VITAMINB12, FOLATE, FERRITIN, TIBC, IRON, RETICCTPCT in the last 72 hours. Urinalysis    Component Value Date/Time   COLORURINE YELLOW 06/06/2021 1510   APPEARANCEUR HAZY (A) 06/06/2021 1510   LABSPEC 1.015 06/06/2021 1510   PHURINE 5.5 06/06/2021 1510   GLUCOSEU NEGATIVE 06/06/2021 1510   HGBUR TRACE (A) 06/06/2021 1510   HGBUR negative 04/30/2010 1020   BILIRUBINUR NEGATIVE 06/06/2021 1510   BILIRUBINUR negative 09/06/2019 1649   KETONESUR NEGATIVE 06/06/2021 1510   PROTEINUR NEGATIVE 06/06/2021 1510   UROBILINOGEN 0.2 09/06/2019 1649   UROBILINOGEN 0.2 03/15/2015 0508   NITRITE POSITIVE (A) 06/06/2021 1510   LEUKOCYTESUR NEGATIVE 06/06/2021 1510   Sepsis Labs Invalid input(s): PROCALCITONIN,  WBC,  LACTICIDVEN Microbiology Recent Results (from the past 240 hour(s))  Resp Panel by RT-PCR (Flu A&B, Covid) Nasopharyngeal Swab     Status: None   Collection Time: 06/06/21 10:27 PM   Specimen: Nasopharyngeal Swab; Nasopharyngeal(NP) swabs in vial transport medium  Result Value Ref Range Status   SARS Coronavirus 2 by RT PCR NEGATIVE NEGATIVE Final    Comment:  (NOTE) SARS-CoV-2 target nucleic acids are NOT DETECTED.  The SARS-CoV-2 RNA is generally detectable in upper respiratory specimens during the acute phase of infection. The lowest concentration of SARS-CoV-2 viral copies this assay can detect is 138 copies/mL. A negative result does not preclude SARS-Cov-2 infection and should not be used as the sole basis for treatment or other patient management decisions. A negative result may occur with  improper specimen collection/handling, submission of specimen other than nasopharyngeal swab, presence of viral mutation(s) within the areas targeted by this assay, and inadequate number of viral copies(<138 copies/mL). A negative result must be combined with clinical observations, patient history, and epidemiological information. The expected result is Negative.  Fact Sheet for Patients:  EntrepreneurPulse.com.au  Fact Sheet for Healthcare Providers:  IncredibleEmployment.be  This test is no t yet approved or cleared by the [place] FDA and  has been authorized for detection and/or diagnosis of SARS-CoV-2 by FDA under an Emergency Use Authorization (EUA). This EUA will remain  in effect (meaning this test can be used) for the duration of the COVID-19 declaration under Section 564(b)(1) of the Act, 21 U.S.C.section 360bbb-3(b)(1), unless the authorization is terminated  or revoked sooner.       Influenza A by PCR NEGATIVE NEGATIVE Final   Influenza B by PCR NEGATIVE NEGATIVE Final    Comment: (NOTE) The Xpert Xpress SARS-CoV-2/FLU/RSV  plus assay is intended as an aid in the diagnosis of influenza from Nasopharyngeal swab specimens and should not be used as a sole basis for treatment. Nasal washings and aspirates are unacceptable for Xpert Xpress SARS-CoV-2/FLU/RSV testing.  Fact Sheet for Patients: EntrepreneurPulse.com.au  Fact Sheet for Healthcare  Providers: IncredibleEmployment.be  This test is not yet approved or cleared by the [place] FDA and has been authorized for detection and/or diagnosis of SARS-CoV-2 by FDA under an Emergency Use Authorization (EUA). This EUA will remain in effect (meaning this test can be used) for the duration of the COVID-19 declaration under Section 564(b)(1) of the Act, 21 U.S.C. section 360bbb-3(b)(1), unless the authorization is terminated or revoked.  Performed at [facility], [place] [address], [place], [place] [address]      Time coordinating discharge: Over 30 minutes  SIGNED:   Nolberto Hanlon, MD  [facility] 06/10/2021, 2:08 PM Pager   If 7PM-7AM, please contact night-coverage [email or web address] Password [email or web address]

## 2021-06-10 NOTE — TOC Transition Note (Addendum)
Transition of Care [facility]) - CM/SW Discharge Note   Patient Details  Name: [name] MRN: [id number] Date of Birth: May 11, 1970  [facility] CM/SW Contact:  Tom-Johnson, Renea Ee, RN Phone Number: 06/10/2021, 11:03 AM   Clinical Narrative:     Patient is scheduled for discharge today. No recommendations noted. Denies any TOC needs. Patient states his mother will transport. No further TOC needs noted.     Final next level of care: Home/Self Care Barriers to Discharge: Barriers Resolved   Patient Goals and CMS Choice Patient states their goals for this hospitalization and ongoing recovery are:: [facility] to [facility]      Discharge Placement                Patient to be transferred to facility by: [facility]      Discharge Plan and Services                DME Arranged: N/A DME Agency: NA         HH Agency: NA        Social Determinants of Health (SDOH) Interventions     Readmission Risk Interventions Readmission Risk Prevention Plan 06/08/2021 08/29/2019 10/15/2018  Transportation Screening Complete Complete Complete  Medication Review Press photographer) Referral to Pharmacy Complete Complete  PCP or Specialist appointment within 3-5 days of discharge Not Complete Complete Complete  PCP/Specialist Appt Not Complete comments patient not ready to d/c - -  [place]RI or [place] Complete Complete Complete  SW Recovery Care/Counseling Consult Complete Complete Complete  Palliative Care Screening Not Applicable Not Applicable Not [place] Not Applicable Not Applicable Not Applicable  Some recent data might be hidden

## 2021-06-10 NOTE — Plan of Care (Signed)
°  Problem: Clinical Measurements: Goal: Ability to maintain clinical measurements within normal limits will improve Outcome: Progressing   Problem: Health Behavior/Discharge Planning: Goal: Ability to manage health-related needs will improve Outcome: Progressing   Problem: Nutrition: Goal: Adequate nutrition will be maintained Outcome: Progressing

## 2021-06-10 NOTE — TOC Progression Note (Signed)
Transition of Care [facility]) - Initial/Assessment Note    Patient Details  Name: [name] MRN: [id number] Date of Birth: 07-09-1970  Transition of Care [facility]) CM/SW Contact:    Ralene Bathe, LCSWA Phone Number: 06/10/2021, 10:08 AM  Clinical Narrative:                 Late entry from 06/09/2021  CSW received a call from [facility] admin that patient requested to see CSW.  CSW met with the patient at bedside.  The patient informed CSW that he was in pain, his stomach was hurting (patient was eating Spaghetti with meat sauce and a brownie during encounter).  Patient stated that he was only receiving oxycodone and that the medication was not helping with pain.  Patient requested dilaudid.  CSW informed patient that CSW cannot prescribe medications and that the plan per MD is for the patient to wean off of IV dilaudid.  CSW notified the RN.    Expected Discharge Plan: Home/Self Care Barriers to Discharge: Continued Medical Work up   Patient Goals and CMS Choice Patient states their goals for this hospitalization and ongoing recovery are:: To go to [facility] rehab      Expected Discharge Plan and Services Expected Discharge Plan: Home/Self Care         Expected Discharge Date: 06/08/21                                    Prior Living Arrangements/Services                       Activities of Daily Living Home Assistive Devices/Equipment: Blood pressure cuff, Eyeglasses, Scales ADL Screening (condition at time of admission) Patient's cognitive ability adequate to safely complete daily activities?: Yes Is the patient deaf or have difficulty hearing?: No Does the patient have difficulty seeing, even when wearing glasses/contacts?: No Does the patient have difficulty concentrating, remembering, or making decisions?: No Patient able to express need for assistance with ADLs?: No Does the patient have difficulty dressing or bathing?: No Independently performs ADLs?: Yes  (appropriate for developmental age) Does the patient have difficulty walking or climbing stairs?: Yes Weakness of Legs: None Weakness of Arms/Hands: None  Permission Sought/Granted                  Emotional Assessment              Admission diagnosis:  Acute alcoholic pancreatitis [K85.20] Alcohol induced acute pancreatitis without necrosis or infection [K85.20] Patient Active Problem List   Diagnosis Date Noted   Leukocytosis 06/07/2021   Epigastric pain 06/07/2021   Acute alcoholic pancreatitis 06/06/2021   Urinary retention    Protein-calorie malnutrition, severe 11/17/2020   Acute pancreatitis 11/15/2020   AKI (acute kidney injury) (HCC) 11/13/2018   Seizure (HCC) 11/13/2018   Acute on chronic pancreatitis (HCC) 09/28/2017   Abdominal pain 05/27/2017   Nausea & vomiting 03/18/2017   Normocytic anemia 12/05/2016   Alcohol use disorder, severe, dependence (HCC) 07/25/2016   Cocaine use disorder, severe, dependence (HCC) 07/25/2016   Major depressive disorder, 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   Chronic pancreatitis (HCC) 05/18/2015   Essential hypertension 02/06/2014   Mood disorder (HCC) 02/06/2014   Pancreatic pseudocyst/cyst 11/25/2013   [name] Parkinson White pattern seen on electrocardiogram 10/03/2012   Tobacco abuse 03/23/2007   PCP:  Pcp, No Pharmacy:  [facility] [place] [address] Phone: [phone] Fax: [phone]  [address] [place] [place] [address] Phone: [phone] Fax: [phone]     Social Determinants of Health (SDOH) Interventions    Readmission Risk Interventions Readmission Risk Prevention Plan 06/08/2021 08/29/2019 10/15/2018  Transportation Screening Complete Complete Complete  Medication Review Press photographer) Referral to Pharmacy Complete Complete  PCP or Specialist  appointment within 3-5 days of discharge Not Complete Complete Complete  PCP/Specialist Appt Not Complete comments patient not ready to d/c - -  [place] or [place] Complete Complete Complete  SW Recovery Care/Counseling Consult Complete Complete Complete  Palliative Care Screening Not Applicable Not Applicable Not [place] Not Applicable Not Applicable Not Applicable  Some recent data might be hidden

## 2021-06-10 NOTE — Plan of Care (Signed)
°  Problem: Clinical Measurements: Goal: Will remain free from infection Outcome: Progressing   Problem: Nutrition: Goal: Adequate nutrition will be maintained Outcome: Progressing   Problem: Coping: Goal: Level of anxiety will decrease Outcome: Progressing

## 2021-06-10 NOTE — Progress Notes (Signed)
Patient's medication and cigarette box was handed back to patient.

## 2021-06-16 IMAGING — DX CHEST - 2 VIEW
2 series · 2 of 2 positions shown · non-contrast
Comparison: November 24, 2018

CLINICAL DATA: Chest pain

EXAM:
CHEST - 2 VIEW

[chest pa]
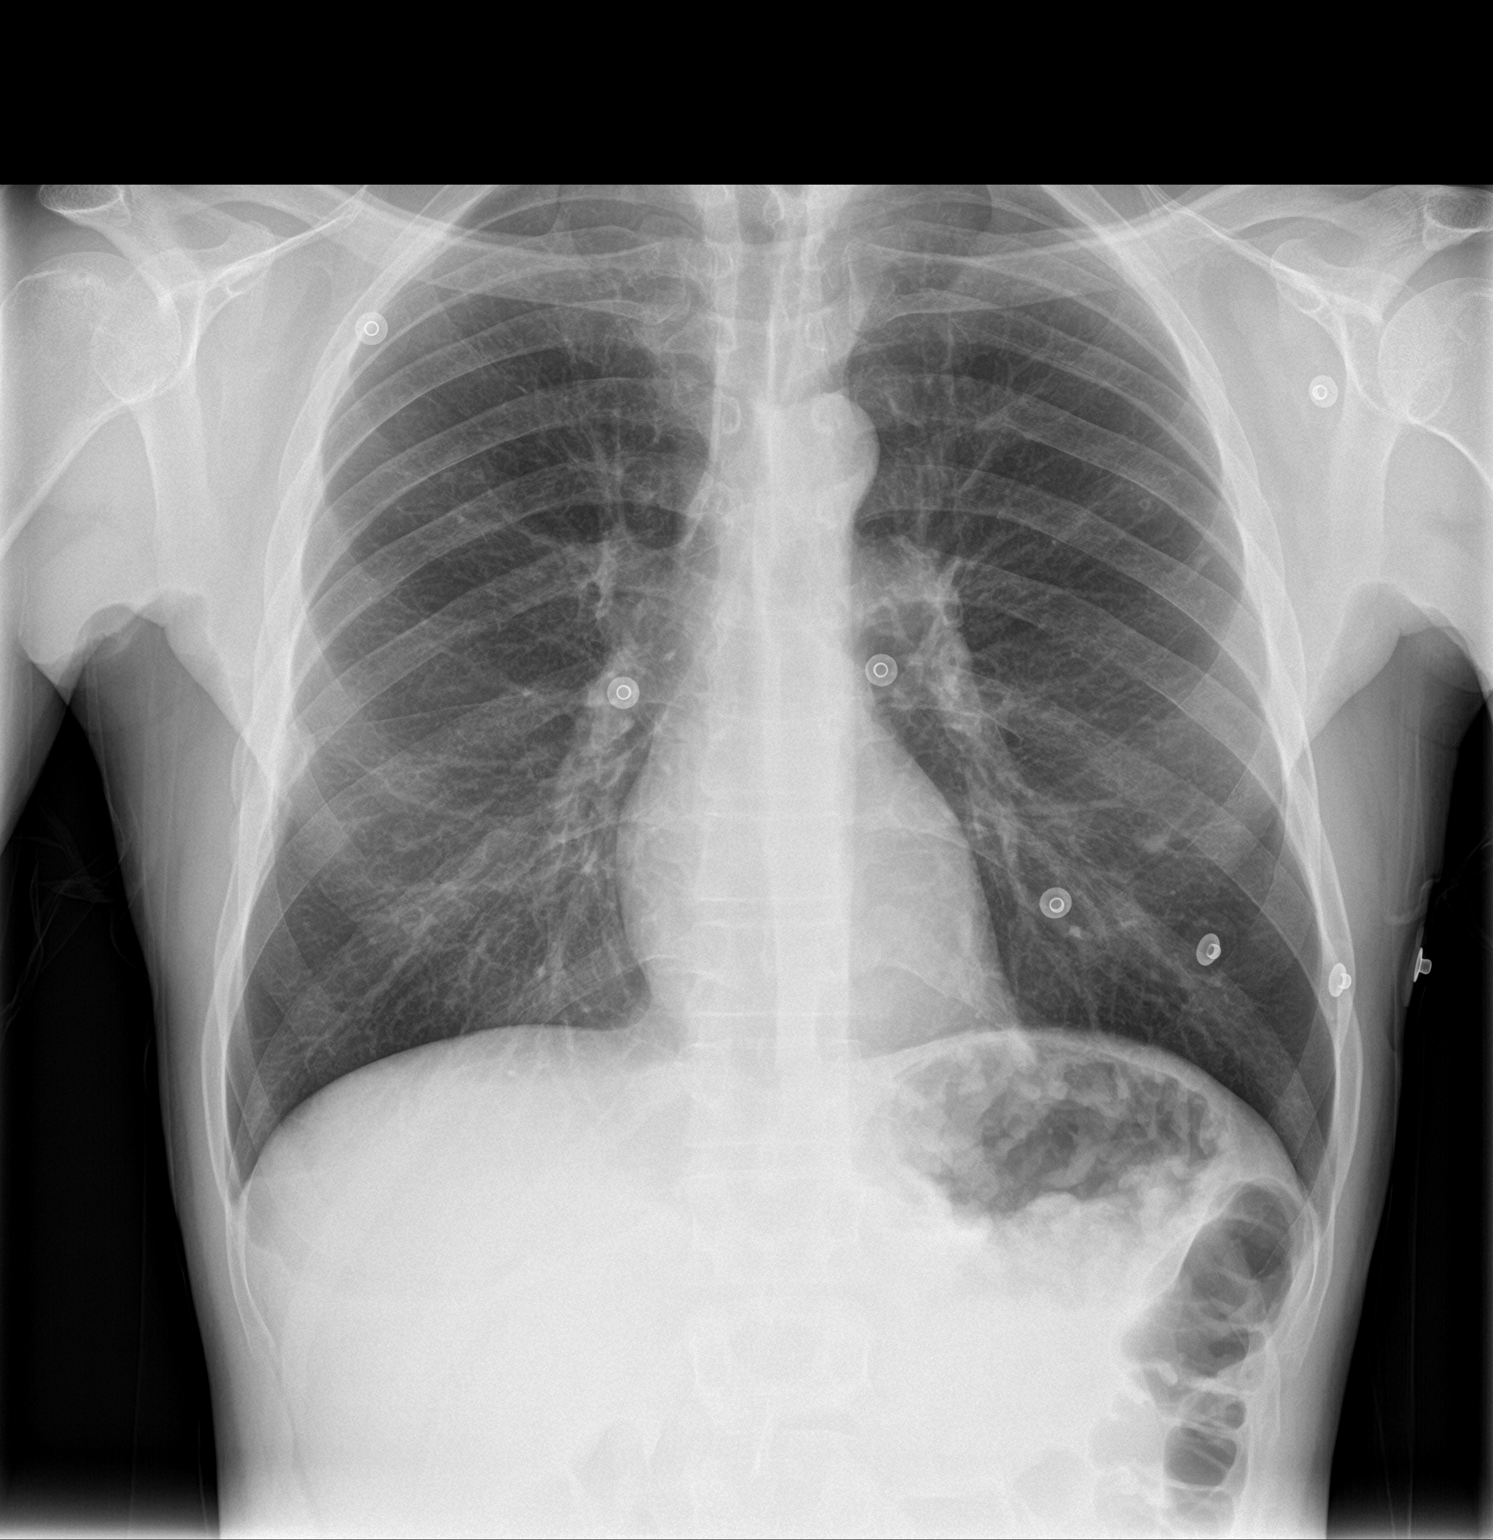

[chest lat]
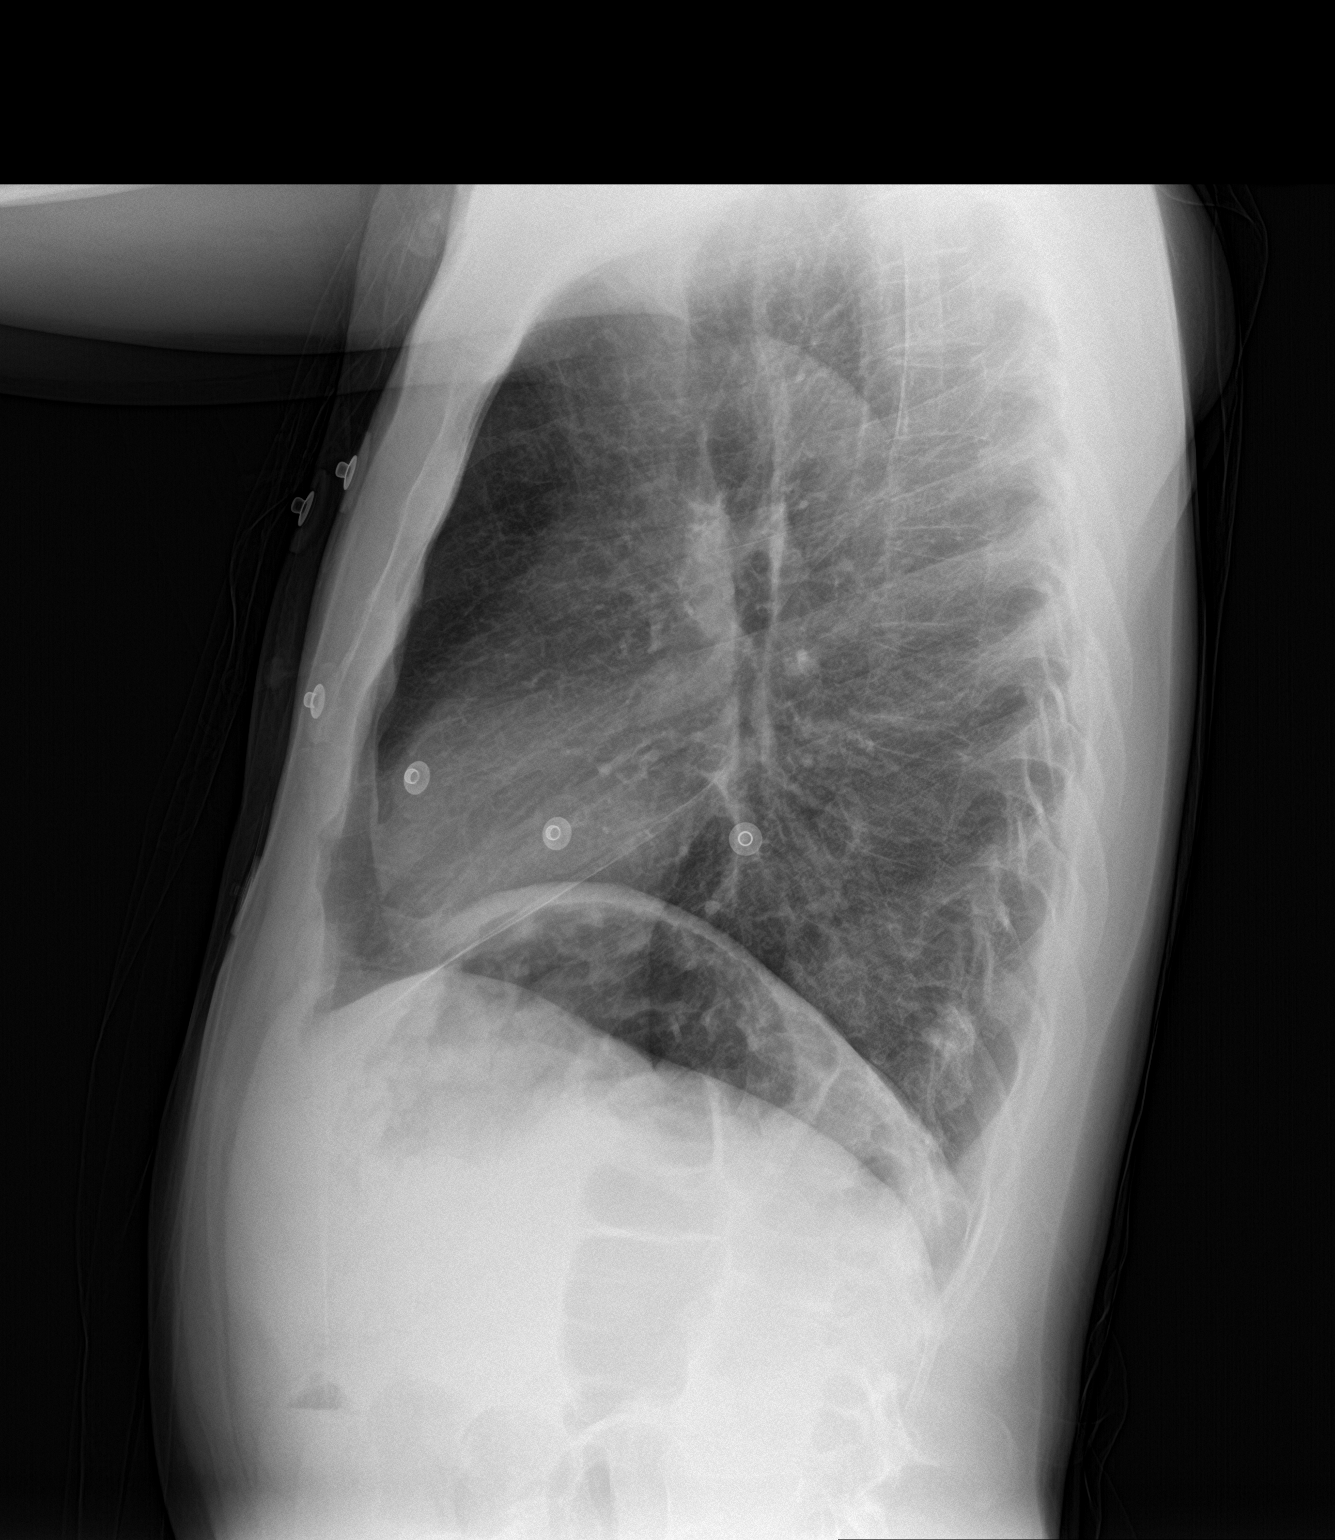

[2 of 2 positions shown; findings below may reference images not displayed]

FINDINGS: The heart size is stable. There is no pneumothorax. No large pleural
effusion. There are old healed right-sided rib fractures. There is a
new pulmonary nodule overlying the left lower the left mid lung
zone, best visualized on the frontal view. This is new since November 17, 2018. the lungs remain somewhat hyperexpanded.
IMPRESSION: 1. No definite acute cardiopulmonary process.
2. New small left-sided pulmonary opacities detailed above. Given
its short interval development, this is favored to represent
atelectasis or small infiltrate. Follow-up to radiologic resolution
is recommended.

## 2021-06-23 ENCOUNTER — Emergency Department (HOSPITAL_COMMUNITY)
Admission: EM | Admit: 2021-06-23 | Discharge: 2021-06-23 | Disposition: A | Discharge status: Home / Self Care | Payer: Medicaid Other | Attending: Student | Admitting: Student

## 2021-06-23 ENCOUNTER — Other Ambulatory Visit: Payer: Self-pay

## 2021-06-23 ENCOUNTER — Emergency Department (HOSPITAL_COMMUNITY): Payer: Medicaid Other

## 2021-06-23 DIAGNOSIS — R110 Nausea: Secondary | ICD-10-CM | POA: Diagnosis not present

## 2021-06-23 DIAGNOSIS — R079 Chest pain, unspecified: Secondary | ICD-10-CM | POA: Diagnosis present

## 2021-06-23 DIAGNOSIS — Z7951 Long term (current) use of inhaled steroids: Secondary | ICD-10-CM | POA: Diagnosis not present

## 2021-06-23 DIAGNOSIS — R109 Unspecified abdominal pain: Secondary | ICD-10-CM | POA: Diagnosis not present

## 2021-06-23 DIAGNOSIS — D72829 Elevated white blood cell count, unspecified: Secondary | ICD-10-CM | POA: Diagnosis not present

## 2021-06-23 DIAGNOSIS — K029 Dental caries, unspecified: Secondary | ICD-10-CM

## 2021-06-23 DIAGNOSIS — I10 Essential (primary) hypertension: Secondary | ICD-10-CM | POA: Diagnosis not present

## 2021-06-23 DIAGNOSIS — Z91040 Latex allergy status: Secondary | ICD-10-CM | POA: Insufficient documentation

## 2021-06-23 DIAGNOSIS — K0889 Other specified disorders of teeth and supporting structures: Secondary | ICD-10-CM | POA: Insufficient documentation

## 2021-06-23 DIAGNOSIS — J45909 Unspecified asthma, uncomplicated: Secondary | ICD-10-CM | POA: Insufficient documentation

## 2021-06-23 DIAGNOSIS — E876 Hypokalemia: Secondary | ICD-10-CM | POA: Diagnosis not present

## 2021-06-23 DIAGNOSIS — Z79899 Other long term (current) drug therapy: Secondary | ICD-10-CM | POA: Diagnosis not present

## 2021-06-23 DIAGNOSIS — F17210 Nicotine dependence, cigarettes, uncomplicated: Secondary | ICD-10-CM | POA: Insufficient documentation

## 2021-06-23 DIAGNOSIS — R519 Headache, unspecified: Secondary | ICD-10-CM | POA: Insufficient documentation

## 2021-06-23 LAB — BASIC METABOLIC PANEL
Anion gap: 13 (ref 5–15)
BUN: 6 mg/dL (ref 6–20)
CO2: 24 mmol/L (ref 22–32)
Calcium: 8.1 mg/dL — ABNORMAL LOW (ref 8.9–10.3)
Chloride: 102 mmol/L (ref 98–111)
Creatinine, Ser: 0.94 mg/dL (ref 0.61–1.24)
GFR, Estimated: >60 mL/min (ref >60)
Glucose, Bld: 94 mg/dL (ref 70–99)
Potassium: 3.3 mmol/L — ABNORMAL LOW (ref 3.5–5.1)
Sodium: 139 mmol/L (ref 135–145)

## 2021-06-23 LAB — CBC
HCT: 34.3 % — ABNORMAL LOW (ref 39.0–52.0)
Hemoglobin: 11.6 g/dL — ABNORMAL LOW (ref 13.0–17.0)
MCH: 29.8 pg (ref 26.0–34.0)
MCHC: 33.8 g/dL (ref 30.0–36.0)
MCV: 88.2 fL (ref 80.0–100.0)
Platelets: 321 10*3/uL (ref 150–400)
RBC: 3.89 MIL/uL — ABNORMAL LOW (ref 4.22–5.81)
RDW: 16.9 % — ABNORMAL HIGH (ref 11.5–15.5)
WBC: 11.4 10*3/uL — ABNORMAL HIGH (ref 4.0–10.5)
nRBC: 0 % (ref 0.0–0.2)

## 2021-06-23 LAB — TROPONIN I (HIGH SENSITIVITY)
Troponin I (High Sensitivity): 6 ng/L (ref <18)
Troponin I (High Sensitivity): 5 ng/L (ref <18)

## 2021-06-23 LAB — LIPASE, BLOOD: Lipase: 29 U/L (ref 11–51)

## 2021-06-23 MED ORDER — DIPHENHYDRAMINE HCL 50 MG/ML IJ SOLN
25.0000 mg | Freq: Once | INTRAMUSCULAR | Status: AC
  Administered 2021-06-23: 25 mg via INTRAVENOUS
  Filled 2021-06-23: qty 1

## 2021-06-23 MED ORDER — AMOXICILLIN-POT CLAVULANATE 875-125 MG PO TABS: 1.0000 | ORAL_TABLET | Freq: Two times a day (BID) | ORAL | 0 refills | Status: DC

## 2021-06-23 MED ORDER — LACTATED RINGERS IV BOLUS
1000.0000 mL | Freq: Once | INTRAVENOUS | Status: AC
  Administered 2021-06-23: 1000 mL via INTRAVENOUS

## 2021-06-23 MED ORDER — LORAZEPAM 2 MG/ML IJ SOLN
0.5000 mg | INTRAMUSCULAR | Status: DC | PRN
  Administered 2021-06-23: 0.5 mg via INTRAVENOUS
  Filled 2021-06-23: qty 1

## 2021-06-23 MED ORDER — DROPERIDOL 2.5 MG/ML IJ SOLN
1.2500 mg | Freq: Once | INTRAMUSCULAR | Status: AC
  Administered 2021-06-23: 1.25 mg via INTRAVENOUS
  Filled 2021-06-23: qty 2

## 2021-06-23 MED ORDER — LIDOCAINE VISCOUS HCL 2 % MT SOLN
15.0000 mL | Freq: Once | OROMUCOSAL | Status: AC
  Administered 2021-06-23: 15 mL via OROMUCOSAL
  Filled 2021-06-23: qty 15

## 2021-06-23 NOTE — ED Provider Notes (Signed)
[place] EMERGENCY DEPARTMENT Provider Note  CSN: [id number] Arrival date & time: 06/23/21 0744  Chief Complaint(s) Chest Pain, Nausea, and Headache  HPI [name] is a 51 y.o. male with PMH bipolar disorder, cocaine abuse, chronic pancreatitis who presents emergency department for evaluation of chest and abdominal pain with nausea and headache as well as tooth pain.  Patient states that his symptoms began approximately 1 hour prior to arrival.  States that pain is primarily in the epigastrium and radiates up into the chest and is consistent with his pancreatitis type pain.  He also endorses a necrotic tooth near tooth #11.  Denies diarrhea, fever or other systemic symptoms.   Chest Pain Associated symptoms: headache   Headache  Past Medical History Past Medical History:  Diagnosis Date   Alcoholism /alcohol abuse    Anemia    Anxiety    Arthritis    "knees; arms; elbows" (03/26/2015)   Asthma    Bipolar disorder ([place])    Chronic bronchitis (HCC)    Chronic lower back pain    Chronic pancreatitis ([place])    Cocaine abuse ([place])    Depression    Family history of adverse reaction to anesthesia    "grandmother gets confused"   Femoral condyle fracture ([place]) 03/08/2014   left medial/notes 03/09/2014   GERD (gastroesophageal reflux disease)    H/O hiatal hernia    H/O suicide attempt 10/2012   High cholesterol    History of blood transfusion 10/2012   "when I tried to commit suicide"   History of stomach ulcers    Hypertension    Marijuana abuse, continuous    Migraine    "a few times/year" (03/26/2015)   Pneumonia 1990's X 3   PTSD (post-traumatic stress disorder)    Sickle cell trait ([place])    WPW (Wolff-Parkinson-White syndrome)    [name] 03/06/2013   Patient Active Problem List   Diagnosis Date Noted   Leukocytosis 06/07/2021   Epigastric pain 43/32/9518   Acute alcoholic pancreatitis 84/16/6063   Urinary retention     Protein-calorie malnutrition, severe 11/17/2020   Acute pancreatitis 11/15/2020   AKI (acute kidney injury) ([place]) 11/13/2018   Seizure ([place]) 11/13/2018   Acute on chronic pancreatitis ([place]) 09/28/2017   Abdominal pain 05/27/2017   Nausea & vomiting 03/18/2017   Normocytic anemia 12/05/2016   Alcohol use disorder, severe, dependence ([place]) 07/25/2016   Cocaine use disorder, severe, dependence ([place]) 07/25/2016   Major depressive disorder, recurrent severe without psychotic features ([place]) 07/20/2016   Chronic pancreatitis ([place]) 05/18/2015   Essential hypertension 02/06/2014   Mood disorder ([place]) 02/06/2014   Pancreatic pseudocyst/cyst 11/25/2013   [name] Parkinson White pattern seen on electrocardiogram 10/03/2012   Tobacco abuse 03/23/2007   Home Medication(s) Prior to Admission medications   Medication Sig Start Date End Date Taking? Authorizing Provider  acetaminophen (TYLENOL) 500 MG tablet Take 500 mg by mouth every 6 (six) hours as needed for moderate pain or headache.    [provider]  albuterol (VENTOLIN HFA) 108 (90 Base) MCG/ACT inhaler Inhale 2 puffs into the lungs every 4 (four) hours as needed for wheezing or shortness of breath. 11/12/19   [name], DO  cholecalciferol (VITAMIN D) 25 MCG tablet Take 1 tablet (1,000 Units total) by mouth daily. 10/30/20   [name], MD  fluticasone furoate-vilanterol (BREO ELLIPTA) 200-25 MCG/INH AEPB Inhale 1 puff into the lungs daily. Patient taking differently: Inhale 1 puff into the lungs daily as needed (  asthma). 11/12/19   [name], DO  folic acid (FOLVITE) 1 MG tablet Take 1 tablet (1 mg total) by mouth daily. Patient not taking: Reported on 05/26/2021 11/26/17   [name], MD  gabapentin (NEURONTIN) 100 MG capsule Take 100 mg by mouth 3 (three) times daily. 05/17/21   [provider]  lidocaine (XYLOCAINE) 2 % solution Use as directed 15 mLs in the mouth or throat every 6 (six) hours as needed for mouth  pain. 11/21/20   [name], [name], DO  lipase/protease/amylase (CREON) 36000 UNITS CPEP capsule Take 2 capsules (72,000 Units total) by mouth 3 (three) times daily with meals. For pancreatitis 11/05/19   [name], [name], MD  magnesium oxide (MAG-OX) 400 (240 Mg) MG tablet Take 400 mg by mouth daily. 12/24/20   [provider]  methocarbamol (ROBAXIN) 750 MG tablet Take 750 mg by mouth every 6 (six) hours as needed for muscle spasms. 05/17/21   [provider]  metoprolol (TOPROL-XL) 200 MG 24 hr tablet Take 200 mg by mouth daily. 10/29/20   [provider]  mirtazapine (REMERON) 15 MG tablet Take 15 mg by mouth at bedtime. 05/17/21   [provider]  Multiple Vitamin (MULTIVITAMIN WITH MINERALS) TABS tablet Take 1 tablet by mouth daily. 09/06/17   Bonnell Public, MD  omeprazole (PRILOSEC) 40 MG capsule TAKE ONE CAPSULE BY MOUTH ONCE TO twice DAILY AS NEEDED Patient taking differently: Take 40 mg by mouth 2 (two) times daily as needed (acid reflex). 04/20/21   [name], [name], MD  ondansetron (ZOFRAN-ODT) 4 MG disintegrating tablet Take 1 tablet (4 mg total) by mouth every 8 (eight) hours as needed for vomiting or nausea. 05/24/21   Quintella Reichert, MD  OVER THE COUNTER MEDICATION Take 1 tablet by mouth daily. Iron supplement.    [provider]  oxyCODONE-acetaminophen (PERCOCET/ROXICET) 5-325 MG tablet Take 1 tablet by mouth every 6 (six) hours as needed for moderate pain. 05/17/21   [provider]  Polyethyl Glycol-Propyl Glycol (SYSTANE OP) Place 1 drop into both eyes daily as needed (dry eyes).    [provider]  sertraline (ZOLOFT) 100 MG tablet Take 1 tablet (100 mg total) by mouth daily. 03/27/20   [name], MD  sucralfate (CARAFATE) 1 g tablet Take 1 tablet (1 g total) by mouth 4 (four) times daily -  with meals and at bedtime. 04/11/21   Rancour, Annie Main, MD  sucralfate (CARAFATE) 1 GM/10ML suspension Take 10 MLs BY MOUTH  FOUR TIMES DAILY with meals AND AT BEDTIME Patient taking differently: Take 1 g by mouth in the morning, at noon, in the evening, and at bedtime. 06/01/21   [name], [name], MD  thiamine 100 MG tablet Take 1 tablet (100 mg total) by mouth daily. 10/16/18   [name], MD  Vitamin D, Ergocalciferol, (DRISDOL) 1.25 MG (50000 UNIT) CAPS capsule Take 50,000 Units by mouth every Wednesday.    [provider]  amitriptyline (ELAVIL) 25 MG tablet Take 1 tablet (25 mg total) by mouth at bedtime. Patient not taking: Reported on 08/08/2019 10/15/18 08/08/19  [name], MD  promethazine (PHENERGAN) 25 MG tablet Take 1 tablet (25 mg total) by mouth every 6 (six) hours as needed for nausea or vomiting. Patient not taking: Reported on 08/08/2019 03/02/19 08/08/19  Kinnie Feil, PA-C  Past Surgical History Past Surgical History:  Procedure Laterality Date   BIOPSY  11/25/2017   Procedure: BIOPSY;  Surgeon: Arta Silence, MD;  Location: [facility] ENDOSCOPY;  Service: Endoscopy;;   BIOPSY  10/14/2018   Procedure: BIOPSY;  Surgeon: Arta Silence, MD;  Location: [facility];  Service: Endoscopy;;   CARDIAC CATHETERIZATION     CYST ENTEROSTOMY  01/02/2020   Procedure: CYST ASPIRATION;  Surgeon: Milus Banister, MD;  Location: [facility] ENDOSCOPY;  Service: Endoscopy;;   ESOPHAGOGASTRODUODENOSCOPY (EGD) WITH PROPOFOL N/A 11/25/2017   Procedure: ESOPHAGOGASTRODUODENOSCOPY (EGD) WITH PROPOFOL;  Surgeon: Arta Silence, MD;  Location: [facility] ENDOSCOPY;  Service: Endoscopy;  Laterality: N/A;   ESOPHAGOGASTRODUODENOSCOPY (EGD) WITH PROPOFOL Left 10/14/2018   Procedure: ESOPHAGOGASTRODUODENOSCOPY (EGD) WITH PROPOFOL;  Surgeon: Arta Silence, MD;  Location: [facility] ENDOSCOPY;  Service: Endoscopy;  Laterality: Left;   ESOPHAGOGASTRODUODENOSCOPY (EGD) WITH PROPOFOL N/A 11/14/2018   Procedure:  ESOPHAGOGASTRODUODENOSCOPY (EGD) WITH PROPOFOL;  Surgeon: Laurence Spates, MD;  Location: [facility] ENDOSCOPY;  Service: Gastroenterology;  Laterality: N/A;   ESOPHAGOGASTRODUODENOSCOPY (EGD) WITH PROPOFOL N/A 01/02/2020   Procedure: ESOPHAGOGASTRODUODENOSCOPY (EGD) WITH PROPOFOL;  Surgeon: Milus Banister, MD;  Location: [facility] ENDOSCOPY;  Service: Endoscopy;  Laterality: N/A;   ESOPHAGOGASTRODUODENOSCOPY (EGD) WITH PROPOFOL N/A 10/25/2020   Procedure: ESOPHAGOGASTRODUODENOSCOPY (EGD) WITH PROPOFOL;  Surgeon: Rush Landmark Telford Nab., MD;  Location: [place];  Service: Gastroenterology;  Laterality: N/A;   EUS N/A 01/02/2020   Procedure: UPPER ENDOSCOPIC ULTRASOUND (EUS) RADIAL;  Surgeon: Milus Banister, MD;  Location: [facility] ENDOSCOPY;  Service: Endoscopy;  Laterality: N/A;   EYE SURGERY Left 1990's   "result of trauma"    FACIAL FRACTURE SURGERY Left 1990's   "result of trauma"    FLEXIBLE SIGMOIDOSCOPY N/A 10/25/2020   Procedure: FLEXIBLE SIGMOIDOSCOPY;  Surgeon: Irving Copas., MD;  Location: [place];  Service: Gastroenterology;  Laterality: N/A;   FRACTURE SURGERY     HEMOSTASIS CLIP PLACEMENT  10/25/2020   Procedure: HEMOSTASIS CLIP PLACEMENT;  Surgeon: Irving Copas., MD;  Location: [place];  Service: Gastroenterology;;   HERNIA REPAIR     LEFT HEART CATHETERIZATION WITH CORONARY ANGIOGRAM Right 03/07/2013   Procedure: LEFT HEART CATHETERIZATION WITH CORONARY ANGIOGRAM;  Surgeon: [name], MD;  Location: [facility] CATH LAB;  Service: Cardiovascular;  Laterality: Right;   UMBILICAL HERNIA REPAIR     UPPER GASTROINTESTINAL ENDOSCOPY     Family History Family History  Problem Relation Age of Onset   Hypertension Mother    Cirrhosis Father    Alcoholism Father    Hypertension Father    Melanoma Father    Hypertension Other    Coronary artery disease Other     Social History Social History   Tobacco Use   Smoking status: Every Day    Packs/day: 1.00    Years:  36.00    Pack years: 36.00    Types: Cigarettes, E-cigarettes   Smokeless tobacco: Never  Vaping Use   Vaping Use: Former  Substance Use Topics   Alcohol use: Yes   Drug use: Yes    Types: Marijuana, Cocaine    Comment: daily marijuana use; last cocaine use about 3 months ago   Allergies Robaxin [methocarbamol], Aspirin, Shellfish-derived products, Trazodone and nefazodone, Adhesive [tape], Latex, Toradol [ketorolac tromethamine], Contrast media [iodinated contrast media], and Reglan [metoclopramide]  Review of Systems Review of Systems  HENT:  Positive for dental problem.   Cardiovascular:  Positive for chest pain.  Neurological:  Positive for headaches.   Physical Exam Vital Signs  I have reviewed the triage vital signs BP (!) 144/98 (BP Location: Right Arm)    Pulse 93    Temp 98.2 F (36.8 C) (Oral)    Resp 18    SpO2 100%   Physical Exam Vitals and nursing note reviewed.  Constitutional:      General: He is not in acute distress.    Appearance: He is well-developed.  HENT:     Head: Normocephalic and atraumatic.     Comments: Necrotic tooth #11, no periapical abscess Eyes:     Conjunctiva/sclera: Conjunctivae normal.  Cardiovascular:     Rate and Rhythm: Normal rate and regular rhythm.     Heart sounds: No murmur heard. Pulmonary:     Effort: Pulmonary effort is normal. No respiratory distress.     Breath sounds: Normal breath sounds.  Abdominal:     Palpations: Abdomen is soft.     Tenderness: There is abdominal tenderness.  Musculoskeletal:        General: No swelling.     Cervical back: Neck supple.  Skin:    General: Skin is warm and dry.     Capillary Refill: Capillary refill takes less than 2 seconds.  Neurological:     Mental Status: He is alert.  Psychiatric:        Mood and Affect: Mood normal.    ED Results and Treatments Labs (all labs ordered are listed, but only abnormal results are displayed) Labs Reviewed  BASIC METABOLIC PANEL -  Abnormal; Notable for the following components:      Result Value   Potassium 3.3 (*)    Calcium 8.1 (*)    All other components within normal limits  CBC - Abnormal; Notable for the following components:   WBC 11.4 (*)    RBC 3.89 (*)    Hemoglobin 11.6 (*)    HCT 34.3 (*)    RDW 16.9 (*)    All other components within normal limits  TROPONIN I (HIGH SENSITIVITY)  TROPONIN I (HIGH SENSITIVITY)                                                                                                                          Radiology DG Chest 2 View  Result Date: 06/23/2021 CLINICAL DATA:  Chest pain EXAM: CHEST - 2 VIEW COMPARISON:  June 06, 2021 FINDINGS: The heart size and mediastinal contours are within normal limits. No focal consolidation. No pleural effusion. No pneumothorax. The visualized skeletal structures are unremarkable. IMPRESSION: No acute cardiopulmonary disease. Electronically Signed   By: Dahlia Bailiff M.D.   On: 06/23/2021 08:10    Pertinent labs & imaging results that were available during my care of the patient were reviewed by me and considered in my medical decision making (see MDM for details).  Medications Ordered in ED Medications - No data to display  Procedures Procedures  (including critical care time)  Medical Decision Making / ED Course   This patient presents to the ED for concern of abdominal pain, chest pain, tooth pain, this involves an extensive number of treatment options, and is a complaint that carries with it a high risk of complications and morbidity.  The differential diagnosis includes pancreatitis, cyclic abdominal pain, pseudocyst, intra-abdominal infection, ACS, pulpitis, periapical abscess  MDM: Patient seen emergency department for evaluation of multiple complaints as described above.  Physical exam with a  necrotic tooth #11, generalized tenderness to palpation worse in the epigastrium.  Laboratory evaluation with a mild leukocytosis to 11.4, hemoglobin 11.6, mild hypokalemia to 3.3, lipase normal at 29, high-sensitivity troponin normal at 5.  ECG nonischemic.  Patient given droperidol and Benadryl and on reevaluation, abdominal pain has improved but unfortunate the patient has some mild akathisia.  Patient was then given a single dose of Ativan and all symptoms improved.  In regards the patient's tooth pain he received viscous lidocaine that eased his pain and he was discharged on Augmentin.  Patient able to tolerate p.o. without difficulty on reevaluation.  Chest x-ray unremarkable.  Patient presentation is consistent with his multiple previous ER presentations and I have low suspicion for ACS at this time.  Heart score is 3.  Patient then discharged with outpatient dental follow-up and given instructions to establish care with a primary care physician.  He was also instructed to stop drinking alcohol.   Additional history obtained:  -External records from outside source obtained and reviewed including: Chart review including previous notes, labs, imaging, consultation notes   Lab Tests: -I ordered, reviewed, and interpreted labs.   The pertinent results include:   Labs Reviewed  BASIC METABOLIC PANEL - Abnormal; Notable for the following components:      Result Value   Potassium 3.3 (*)    Calcium 8.1 (*)    All other components within normal limits  CBC - Abnormal; Notable for the following components:   WBC 11.4 (*)    RBC 3.89 (*)    Hemoglobin 11.6 (*)    HCT 34.3 (*)    RDW 16.9 (*)    All other components within normal limits  TROPONIN I (HIGH SENSITIVITY)  TROPONIN I (HIGH SENSITIVITY)      EKG   EKG Interpretation  Date/Time:  Wednesday June 23 2021 10:26:25 EST Ventricular Rate:  90 PR Interval:  101 QRS Duration: 76 QT Interval:  308 QTC Calculation: 377 R  Axis:   78 Text Interpretation: Sinus arrhythmia Multiform ventricular premature complexes Short PR interval similar to prior Abnormal ECG Confirmed by Carmin Muskrat [phone]) on 06/23/2021 10:32:51 AM         Imaging Studies ordered: I ordered imaging studies including CXR I independently visualized and interpreted imaging. I agree with the radiologist interpretation   Medicines ordered and prescription drug management: No orders of the defined types were placed in this encounter.   -I have reviewed the patients home medicines and have made adjustments as needed  Critical interventions none   Cardiac Monitoring: The patient was maintained on a cardiac monitor.  I personally viewed and interpreted the cardiac monitored which showed an underlying rhythm of: NSR  Social Determinants of Health:  Factors impacting patients care include: Biceps abuse, alcohol abuse, multiple ER presentations   Reevaluation: After the interventions noted above, I reevaluated the patient and found that they have :improved  Co morbidities that complicate the patient evaluation  Past  Medical History:  Diagnosis Date   Alcoholism /alcohol abuse    Anemia    Anxiety    Arthritis    "knees; arms; elbows" (03/26/2015)   Asthma    Bipolar disorder (HCC)    Chronic bronchitis (HCC)    Chronic lower back pain    Chronic pancreatitis ([place])    Cocaine abuse ([place])    Depression    Family history of adverse reaction to anesthesia    "grandmother gets confused"   Femoral condyle fracture ([place]) 03/08/2014   left medial/notes 03/09/2014   GERD (gastroesophageal reflux disease)    H/O hiatal hernia    H/O suicide attempt 10/2012   High cholesterol    History of blood transfusion 10/2012   "when I tried to commit suicide"   History of stomach ulcers    Hypertension    Marijuana abuse, continuous    Migraine    "a few times/year" (03/26/2015)   Pneumonia 1990's X 3   PTSD (post-traumatic stress  disorder)    Sickle cell trait (HCC)    WPW (Wolff-Parkinson-White syndrome)    [name] 03/06/2013      Dispostion: I considered admission for this patient, but as his symptoms have resolved and his dental pain is treatable outside the emergency department, patient is safe for outpatient follow-up.     Final Clinical Impression(s) / ED Diagnoses Final diagnoses:  None     @PCDICTATION @    Teressa Lower, MD 06/23/21 1547

## 2021-06-23 NOTE — ED Notes (Signed)
Pt ambulated to restroom for second time with steady gait.

## 2021-06-23 NOTE — ED Notes (Signed)
Pt ambulated to restroom with steady gait.

## 2021-06-23 NOTE — ED Triage Notes (Addendum)
EMS stated, he started having chest pain a hour ago. He also said he has nausea, headache. 24g Cough with rhonchi, hx of pneumonia

## 2021-06-23 NOTE — ED Notes (Signed)
Pt ambulated to restroom with steady gait. Pt not in restroom but 3 mins before returning

## 2021-06-23 NOTE — ED Notes (Signed)
Pt provided sack lunch on d/c

## 2021-06-26 ENCOUNTER — Emergency Department (HOSPITAL_COMMUNITY)
Admission: EM | Admit: 2021-06-26 | Discharge: 2021-06-26 | Disposition: A | Discharge status: Home / Self Care | Payer: Medicaid Other | Attending: Emergency Medicine | Admitting: Emergency Medicine

## 2021-06-26 ENCOUNTER — Other Ambulatory Visit: Payer: Self-pay

## 2021-06-26 ENCOUNTER — Encounter (HOSPITAL_COMMUNITY): Payer: Self-pay | Admitting: Emergency Medicine

## 2021-06-26 DIAGNOSIS — R079 Chest pain, unspecified: Secondary | ICD-10-CM | POA: Diagnosis not present

## 2021-06-26 DIAGNOSIS — Z91040 Latex allergy status: Secondary | ICD-10-CM | POA: Insufficient documentation

## 2021-06-26 DIAGNOSIS — K0889 Other specified disorders of teeth and supporting structures: Secondary | ICD-10-CM | POA: Diagnosis not present

## 2021-06-26 DIAGNOSIS — R109 Unspecified abdominal pain: Secondary | ICD-10-CM

## 2021-06-26 DIAGNOSIS — K921 Melena: Secondary | ICD-10-CM | POA: Insufficient documentation

## 2021-06-26 LAB — CBC WITH DIFFERENTIAL/PLATELET
Abs Immature Granulocytes: 0.05 10*3/uL (ref 0.00–0.07)
Basophils Absolute: 0 10*3/uL (ref 0.0–0.1)
Basophils Relative: 0 %
Eosinophils Absolute: 0.5 10*3/uL (ref 0.0–0.5)
Eosinophils Relative: 4 %
HCT: 31.5 % — ABNORMAL LOW (ref 39.0–52.0)
Hemoglobin: 9.6 g/dL — ABNORMAL LOW (ref 13.0–17.0)
Immature Granulocytes: 0 %
Lymphocytes Relative: 18 %
Lymphs Abs: 2 10*3/uL (ref 0.7–4.0)
MCH: 28.7 pg (ref 26.0–34.0)
MCHC: 30.5 g/dL (ref 30.0–36.0)
MCV: 94.3 fL (ref 80.0–100.0)
Monocytes Absolute: 1.2 10*3/uL — ABNORMAL HIGH (ref 0.1–1.0)
Monocytes Relative: 11 %
Neutro Abs: 7.5 10*3/uL (ref 1.7–7.7)
Neutrophils Relative %: 67 %
Platelets: 257 10*3/uL (ref 150–400)
RBC: 3.34 MIL/uL — ABNORMAL LOW (ref 4.22–5.81)
RDW: 17.2 % — ABNORMAL HIGH (ref 11.5–15.5)
WBC: 11.3 10*3/uL — ABNORMAL HIGH (ref 4.0–10.5)
nRBC: 0 % (ref 0.0–0.2)

## 2021-06-26 LAB — COMPREHENSIVE METABOLIC PANEL
ALT: 13 U/L (ref 0–44)
AST: 17 U/L (ref 15–41)
Albumin: 3.2 g/dL — ABNORMAL LOW (ref 3.5–5.0)
Alkaline Phosphatase: 72 U/L (ref 38–126)
Anion gap: 9 (ref 5–15)
BUN: <5 mg/dL — ABNORMAL LOW (ref 6–20)
CO2: 18 mmol/L — ABNORMAL LOW (ref 22–32)
Calcium: 7.5 mg/dL — ABNORMAL LOW (ref 8.9–10.3)
Chloride: 109 mmol/L (ref 98–111)
Creatinine, Ser: 0.82 mg/dL (ref 0.61–1.24)
GFR, Estimated: >60 mL/min (ref >60)
Glucose, Bld: 99 mg/dL (ref 70–99)
Potassium: 3.7 mmol/L (ref 3.5–5.1)
Sodium: 136 mmol/L (ref 135–145)
Total Bilirubin: 0.4 mg/dL (ref 0.3–1.2)
Total Protein: 6.4 g/dL — ABNORMAL LOW (ref 6.5–8.1)

## 2021-06-26 LAB — PROTIME-INR
INR: 1 (ref 0.8–1.2)
Prothrombin Time: 13.2 seconds (ref 11.4–15.2)

## 2021-06-26 LAB — LIPASE, BLOOD: Lipase: 24 U/L (ref 11–51)

## 2021-06-26 LAB — POC OCCULT BLOOD, ED: Fecal Occult Bld: NEGATIVE

## 2021-06-26 MED ORDER — LIDOCAINE VISCOUS HCL 2 % MT SOLN
15.0000 mL | Freq: Once | OROMUCOSAL | Status: AC
Start: 2021-06-26 — End: 2021-06-26
  Administered 2021-06-26: 15 mL via ORAL
  Filled 2021-06-26: qty 15

## 2021-06-26 MED ORDER — DIPHENHYDRAMINE HCL 50 MG/ML IJ SOLN
25.0000 mg | Freq: Once | INTRAMUSCULAR | Status: AC
Start: 2021-06-26 — End: 2021-06-26
  Administered 2021-06-26: 25 mg via INTRAVENOUS
  Filled 2021-06-26: qty 1

## 2021-06-26 MED ORDER — DROPERIDOL 2.5 MG/ML IJ SOLN
1.2500 mg | Freq: Once | INTRAMUSCULAR | Status: DC
  Filled 2021-06-26: qty 2

## 2021-06-26 MED ORDER — SODIUM CHLORIDE 0.9 % IV BOLUS
1000.0000 mL | Freq: Once | INTRAVENOUS | Status: AC
  Administered 2021-06-26: 1000 mL via INTRAVENOUS

## 2021-06-26 MED ORDER — HYDROMORPHONE HCL 1 MG/ML IJ SOLN
1.0000 mg | Freq: Once | INTRAMUSCULAR | Status: AC
  Administered 2021-06-26: 1 mg via INTRAVENOUS
  Filled 2021-06-26: qty 1

## 2021-06-26 MED ORDER — ONDANSETRON HCL 4 MG/2ML IJ SOLN
4.0000 mg | Freq: Once | INTRAMUSCULAR | Status: AC
  Administered 2021-06-26: 4 mg via INTRAVENOUS
  Filled 2021-06-26: qty 2

## 2021-06-26 MED ORDER — ALUM & MAG HYDROXIDE-SIMETH 200-200-20 MG/5ML PO SUSP
30.0000 mL | Freq: Once | ORAL | Status: AC
Start: 2021-06-26 — End: 2021-06-26
  Administered 2021-06-26: 30 mL via ORAL
  Filled 2021-06-26: qty 30

## 2021-06-26 NOTE — ED Triage Notes (Signed)
Patient here from home, started taking amoxicillin and ibuprofen for his tooth abscess, now having dark red stools.  No nausea or vomiting.  Generalized abdominal tenderness.

## 2021-06-26 NOTE — ED Provider Notes (Signed)
[facility] EMERGENCY DEPARTMENT Provider Note   CSN: [id number] Arrival date & time: 06/26/21  2536     History  Chief Complaint  Patient presents with   Hematochezia    [name] is a 51 y.o. male.  51 year old male presents today for evaluation of abdominal pain, hematochezia.  Patient has a history of alcohol induced pancreatitis and was recently evaluated on 2/15 for abdominal pain, dental pain, and chest pain.  At that time he was without any signs of periapical abscess and was started on Augmentin.  He states he has been taking Tylenol, home pain medication, ibuprofen for pain relief.  He states since midnight he has had 6 episodes of maroon-colored stools that are of loose consistency.  Denies hematemesis.  Without fever, chills, or vomiting.  Reports his abdominal pain is consistent with his pancreatitis flareup.  The history is provided by the patient. No language interpreter was used.      Home Medications Prior to Admission medications   Medication Sig Start Date End Date Taking? Authorizing Provider  acetaminophen (TYLENOL) 500 MG tablet Take 500 mg by mouth every 6 (six) hours as needed for moderate pain or headache.    [provider]  albuterol (VENTOLIN HFA) 108 (90 Base) MCG/ACT inhaler Inhale 2 puffs into the lungs every 4 (four) hours as needed for wheezing or shortness of breath. 11/12/19   [name], DO  amoxicillin-clavulanate (AUGMENTIN) 875-125 MG tablet Take 1 tablet by mouth every 12 (twelve) hours. 06/23/21   [name], [name], MD  cholecalciferol (VITAMIN D) 25 MCG tablet Take 1 tablet (1,000 Units total) by mouth daily. 10/30/20   [name], MD  fluticasone furoate-vilanterol (BREO ELLIPTA) 200-25 MCG/INH AEPB Inhale 1 puff into the lungs daily. Patient taking differently: Inhale 1 puff into the lungs daily as needed (asthma). 11/12/19   [name], DO  folic acid (FOLVITE) 1 MG tablet Take 1 tablet (1 mg  total) by mouth daily. Patient not taking: Reported on 05/26/2021 11/26/17   [name], MD  gabapentin (NEURONTIN) 100 MG capsule Take 100 mg by mouth 3 (three) times daily. 05/17/21   [provider]  lidocaine (XYLOCAINE) 2 % solution Use as directed 15 mLs in the mouth or throat every 6 (six) hours as needed for mouth pain. 11/21/20   [name], [name], DO  lipase/protease/amylase (CREON) 36000 UNITS CPEP capsule Take 2 capsules (72,000 Units total) by mouth 3 (three) times daily with meals. For pancreatitis 11/05/19   [name], [name], MD  magnesium oxide (MAG-OX) 400 (240 Mg) MG tablet Take 400 mg by mouth daily. 12/24/20   [provider]  methocarbamol (ROBAXIN) 750 MG tablet Take 750 mg by mouth every 6 (six) hours as needed for muscle spasms. 05/17/21   [provider]  metoprolol (TOPROL-XL) 200 MG 24 hr tablet Take 200 mg by mouth daily. 10/29/20   [provider]  mirtazapine (REMERON) 15 MG tablet Take 15 mg by mouth at bedtime. 05/17/21   [provider]  Multiple Vitamin (MULTIVITAMIN WITH MINERALS) TABS tablet Take 1 tablet by mouth daily. 09/06/17   Bonnell Public, MD  omeprazole (PRILOSEC) 40 MG capsule TAKE ONE CAPSULE BY MOUTH ONCE TO twice DAILY AS NEEDED Patient taking differently: Take 40 mg by mouth 2 (two) times daily as needed (acid reflex). 04/20/21   [name], [name], MD  ondansetron (ZOFRAN-ODT) 4 MG disintegrating tablet Take 1 tablet (4 mg total) by mouth every  8 (eight) hours as needed for vomiting or nausea. 05/24/21   Quintella Reichert, MD  OVER THE COUNTER MEDICATION Take 1 tablet by mouth daily. Iron supplement.    [provider]  oxyCODONE-acetaminophen (PERCOCET/ROXICET) 5-325 MG tablet Take 1 tablet by mouth every 6 (six) hours as needed for moderate pain. 05/17/21   [provider]  Polyethyl Glycol-Propyl Glycol (SYSTANE OP) Place 1 drop into both eyes daily as needed (dry eyes).    [provider]  sertraline (ZOLOFT) 100 MG tablet Take 1 tablet (100 mg total) by mouth daily. 03/27/20   [name], MD  sucralfate (CARAFATE) 1 g tablet Take 1 tablet (1 g total) by mouth 4 (four) times daily -  with meals and at bedtime. 04/11/21   Rancour, Annie Main, MD  sucralfate (CARAFATE) 1 GM/10ML suspension Take 10 MLs BY MOUTH FOUR TIMES DAILY with meals AND AT BEDTIME Patient taking differently: Take 1 g by mouth in the morning, at noon, in the evening, and at bedtime. 06/01/21   [name], [name], MD  thiamine 100 MG tablet Take 1 tablet (100 mg total) by mouth daily. 10/16/18   [name], MD  Vitamin D, Ergocalciferol, (DRISDOL) 1.25 MG (50000 UNIT) CAPS capsule Take 50,000 Units by mouth every Wednesday.    [provider]  amitriptyline (ELAVIL) 25 MG tablet Take 1 tablet (25 mg total) by mouth at bedtime. Patient not taking: Reported on 08/08/2019 10/15/18 08/08/19  [name], MD  promethazine (PHENERGAN) 25 MG tablet Take 1 tablet (25 mg total) by mouth every 6 (six) hours as needed for nausea or vomiting. Patient not taking: Reported on 08/08/2019 03/02/19 08/08/19  Kinnie Feil, PA-C      Allergies    Robaxin [methocarbamol], Aspirin, Shellfish-derived products, Trazodone and nefazodone, Adhesive [tape], Latex, Toradol [ketorolac tromethamine], Contrast media [iodinated contrast media], and Reglan [metoclopramide]    Review of Systems   Review of Systems  Constitutional:  Negative for chills and fever.  Respiratory:  Negative for shortness of breath.   Gastrointestinal:  Positive for abdominal pain, blood in stool and diarrhea. Negative for nausea and vomiting.  Neurological:  Negative for weakness.  All other systems reviewed and are negative.  Physical Exam Updated Vital Signs BP 140/90 (BP Location: Right Arm)    Pulse 82    Temp 98.5 F (36.9 C) (Oral)    Resp 15    SpO2 98%  Physical Exam Vitals and nursing note reviewed. Exam conducted with a  chaperone present.  Constitutional:      General: He is not in acute distress.    Appearance: Normal appearance. He is not ill-appearing.  HENT:     Head: Normocephalic and atraumatic.     Nose: Nose normal.  Eyes:     General: No scleral icterus.    Extraocular Movements: Extraocular movements intact.     Conjunctiva/sclera: Conjunctivae normal.  Cardiovascular:     Rate and Rhythm: Normal rate and regular rhythm.     Pulses: Normal pulses.     Heart sounds: Normal heart sounds.  Pulmonary:     Effort: Pulmonary effort is normal. No respiratory distress.     Breath sounds: Normal breath sounds. No wheezing or rales.  Abdominal:     General: There is no distension.     Palpations: Abdomen is soft.     Tenderness: There is abdominal tenderness. There is no right CVA tenderness, left CVA tenderness or guarding.  Genitourinary:  Rectum: Guaiac result negative. No tenderness, anal fissure or external hemorrhoid. Normal anal tone.     Comments: Brown-colored stool on exam. Musculoskeletal:        General: Normal range of motion.     Cervical back: Normal range of motion.     Right lower leg: No edema.     Left lower leg: No edema.  Skin:    General: Skin is warm and dry.  Neurological:     General: No focal deficit present.     Mental Status: He is alert. Mental status is at baseline.    ED Results / Procedures / Treatments   Labs (all labs ordered are listed, but only abnormal results are displayed) Labs Reviewed  CBC WITH DIFFERENTIAL/PLATELET - Abnormal; Notable for the following components:      Result Value   WBC 11.3 (*)    RBC 3.34 (*)    Hemoglobin 9.6 (*)    HCT 31.5 (*)    RDW 17.2 (*)    Monocytes Absolute 1.2 (*)    All other components within normal limits  COMPREHENSIVE METABOLIC PANEL - Abnormal; Notable for the following components:   CO2 18 (*)    BUN <5 (*)    Calcium 7.5 (*)    Total Protein 6.4 (*)    Albumin 3.2 (*)    All other components  within normal limits  PROTIME-INR  LIPASE, BLOOD  POC OCCULT BLOOD, ED    EKG None  Radiology No results found.  Procedures Procedures    Medications Ordered in ED Medications  sodium chloride 0.9 % bolus 1,000 mL (has no administration in time range)  droperidol (INAPSINE) 2.5 MG/ML injection 1.25 mg (has no administration in time range)  diphenhydrAMINE (BENADRYL) injection 25 mg (has no administration in time range)  alum & mag hydroxide-simeth (MAALOX/MYLANTA) 200-200-20 MG/5ML suspension 30 mL (has no administration in time range)    And  lidocaine (XYLOCAINE) 2 % viscous mouth solution 15 mL (has no administration in time range)    ED Course/ Medical Decision Making/ A&P                           Medical Decision Making Risk OTC drugs. Prescription drug management.   Medical Decision Making / ED Course   This patient presents to the ED for concern of hematochezia, abdominal pain, this involves an extensive number of treatment options, and is a complaint that carries with it a high risk of complications and morbidity.  The differential diagnosis includes upper GI bleed, lower GI bleed, acute pancreatitis, cholecystitis, appendicitis, diverticulitis, hemorrhoids  MDM: 51 year old male with past medical history of alcohol abuse, cocaine abuse, chronic pancreatitis who presents today for evaluation of hematochezia, abdominal pain.  Patient was recently evaluated on 2/15 for multiple complaints and was discharged on antibiotics for dental pain without associated abscess.  He reports since then he has been taking his antibiotic along with Tylenol, ibuprofen, his oxycodone and last night around midnight he noticed maroon-colored stools.  Reports 6 episodes since onset.  Denies lightheadedness, chest pain, palpitations, hematemesis.  Hemoglobin 9.6.  His baseline is typically around 10-11.  Most recently as low as 10.1 2 weeks ago.  My leukocytosis of 11.3 on CBC otherwise CBC  unremarkable.  CMP without electrolyte derangements or abnormal LFTs.  Lipase within normal limits.  INR 1.0.  Patient's rectal exam with brown color stool, and guaiac negative Hemoccult.  Patient without acute  findings concerning for GI bleed.  Vital signs stable.  Without tachycardia.  Given he has reported history of gastric ulcers discuss cessation of ibuprofen.  Will provide Protonix.  Patient has GI follow-up scheduled for sometime in March.  Advised to call GI clinic to notify them if he has additional episodes.  If he has worsening symptoms we did discuss return precautions.  Patient is appropriate for discharge.  Patient voices understanding and is in agreement with plan. Unlikely patient has cholecystitis or appendicitis given he is without fever, only mild leukocytosis, and pain is consistent with his previous pancreatitis episodes.  No evidence of hemorrhoids on exam.  Unlikely to be diverticulitis given location of pain, and history is consistent with his previous pancreatitis episodes. Improvement in pain following IV pain medication.   Additional history obtained: -Additional history obtained from previous ER visits in the past year to 2 years where he presented with maroon-colored stools.  Patient at that time also without evidence of acute GI bleed with stable hemoglobin.  -External records from outside source obtained and reviewed including: Chart review including previous notes, labs, imaging, consultation notes   Lab Tests: -I ordered, reviewed, and interpreted labs.   The pertinent results include:   Labs Reviewed  CBC WITH DIFFERENTIAL/PLATELET - Abnormal; Notable for the following components:      Result Value   WBC 11.3 (*)    RBC 3.34 (*)    Hemoglobin 9.6 (*)    HCT 31.5 (*)    RDW 17.2 (*)    Monocytes Absolute 1.2 (*)    All other components within normal limits  COMPREHENSIVE METABOLIC PANEL - Abnormal; Notable for the following components:   CO2 18 (*)    BUN <5  (*)    Calcium 7.5 (*)    Total Protein 6.4 (*)    Albumin 3.2 (*)    All other components within normal limits  PROTIME-INR  LIPASE, BLOOD  POC OCCULT BLOOD, ED      EKG  EKG Interpretation  Date/Time:    Ventricular Rate:    PR Interval:    QRS Duration:   QT Interval:    QTC Calculation:   R Axis:     Text Interpretation:          Medicines ordered and prescription drug management: Meds ordered this encounter  Medications   sodium chloride 0.9 % bolus 1,000 mL   droperidol (INAPSINE) 2.5 MG/ML injection 1.25 mg   diphenhydrAMINE (BENADRYL) injection 25 mg   AND Linked Order Group    alum & mag hydroxide-simeth (MAALOX/MYLANTA) 200-200-20 MG/5ML suspension 30 mL    lidocaine (XYLOCAINE) 2 % viscous mouth solution 15 mL   HYDROmorphone (DILAUDID) injection 1 mg   ondansetron (ZOFRAN) injection 4 mg    -I have reviewed the patients home medicines and have made adjustments as needed  Social Determinants of Health:  Factors impacting patients care include: History of alcohol abuse.  Discussed cessation as it can worsen the symptoms.  Provided dental resource sheet since he states he is not able to get in with a dentist for another 1.5 weeks.   Reevaluation: After the interventions noted above, I reevaluated the patient and found that they have :improved  Co morbidities that complicate the patient evaluation  Past Medical History:  Diagnosis Date   Alcoholism /alcohol abuse    Anemia    Anxiety    Arthritis    "knees; arms; elbows" (03/26/2015)   Asthma    Bipolar  disorder ([place])    Chronic bronchitis ([place])    Chronic lower back pain    Chronic pancreatitis ([place])    Cocaine abuse ([place])    Depression    Family history of adverse reaction to anesthesia    "grandmother gets confused"   Femoral condyle fracture ([place]) 03/08/2014   left medial/notes 03/09/2014   GERD (gastroesophageal reflux disease)    H/O hiatal hernia    H/O suicide attempt 10/2012   High  cholesterol    History of blood transfusion 10/2012   "when I tried to commit suicide"   History of stomach ulcers    Hypertension    Marijuana abuse, continuous    Migraine    "a few times/year" (03/26/2015)   Pneumonia 1990's X 3   PTSD (post-traumatic stress disorder)    Sickle cell trait (H[facility])    WPW (Wolff-Parkinson-White syndrome)    [name] 03/06/2013      Dispostion: Patient is appropriate for discharge.  Discharged in stable condition.  Return precautions discussed.  Patient voices understanding and is in agreement with plan.   Final Clinical Impression(s) / ED Diagnoses Final diagnoses:  Abdominal pain, unspecified abdominal location  Hematochezia    Rx / DC Orders ED Discharge Orders     None         Evlyn Courier, PA-C 06/26/21 1035    Davonna Belling, MD 06/26/21 1526

## 2021-06-26 NOTE — Discharge Instructions (Addendum)
Your work-up today was reassuring.  Your hemoglobin was slightly down but not significantly.  Your rectal exam did not show any concern for source of your bleeding.  A test checking for microscopic bleeding was also negative.  Your stools were brown on exam.  Your vital signs were stable in the emergency room.  You received medication with improvement in your pain.  If you have worsening bleeding or black stools please return to the emergency room.  Otherwise I do recommend you call your gastroenterologist Monday and either follow-up or make them aware of your symptoms.  I recommend that you stop taking NSAIDs which include ibuprofen, naproxen, or other similar medications.

## 2021-06-26 NOTE — ED Provider Triage Note (Signed)
Emergency Medicine Provider Triage Evaluation Note  [name] , a 51 y.o. male  was evaluated in triage.  Pt complains of maroon colored stool x 6 onset at midnight tonight. States consistency is similar to diarrhea. Has been on Amoxicillin x 2 days with tylenol and ibuprofen to manage some ongoing dental pain. Feels these medications have aggravated his pancreas. No fevers, vomiting, melena, use of chronic anticoagulation.  Review of Systems  Positive: As above Negative: As above  Physical Exam  BP 140/90 (BP Location: Right Arm)    Pulse 82    Temp 98.5 F (36.9 C) (Oral)    Resp 15    SpO2 98%  Gen:   Awake, no distress   Resp:  Normal effort  MSK:   Moves extremities without difficulty  Other:  Ambulatory with steady gait  Medical Decision Making  Medically screening exam initiated at 6:25 AM.  Appropriate orders placed.  [name] was informed that the remainder of the evaluation will be completed by another provider, this initial triage assessment does not replace that evaluation, and the importance of remaining in the ED until their evaluation is complete.  Hematochezia    [name], [place] 06/26/21 3729

## 2021-06-29 IMAGING — DX DG CHEST 1V PORT
1 series · 1 of 1 positions shown · non-contrast
Comparison: 12/25/2018 and prior studies

CLINICAL DATA: Acute chest pain.

EXAM:
PORTABLE CHEST 1 VIEW

[chest ap]
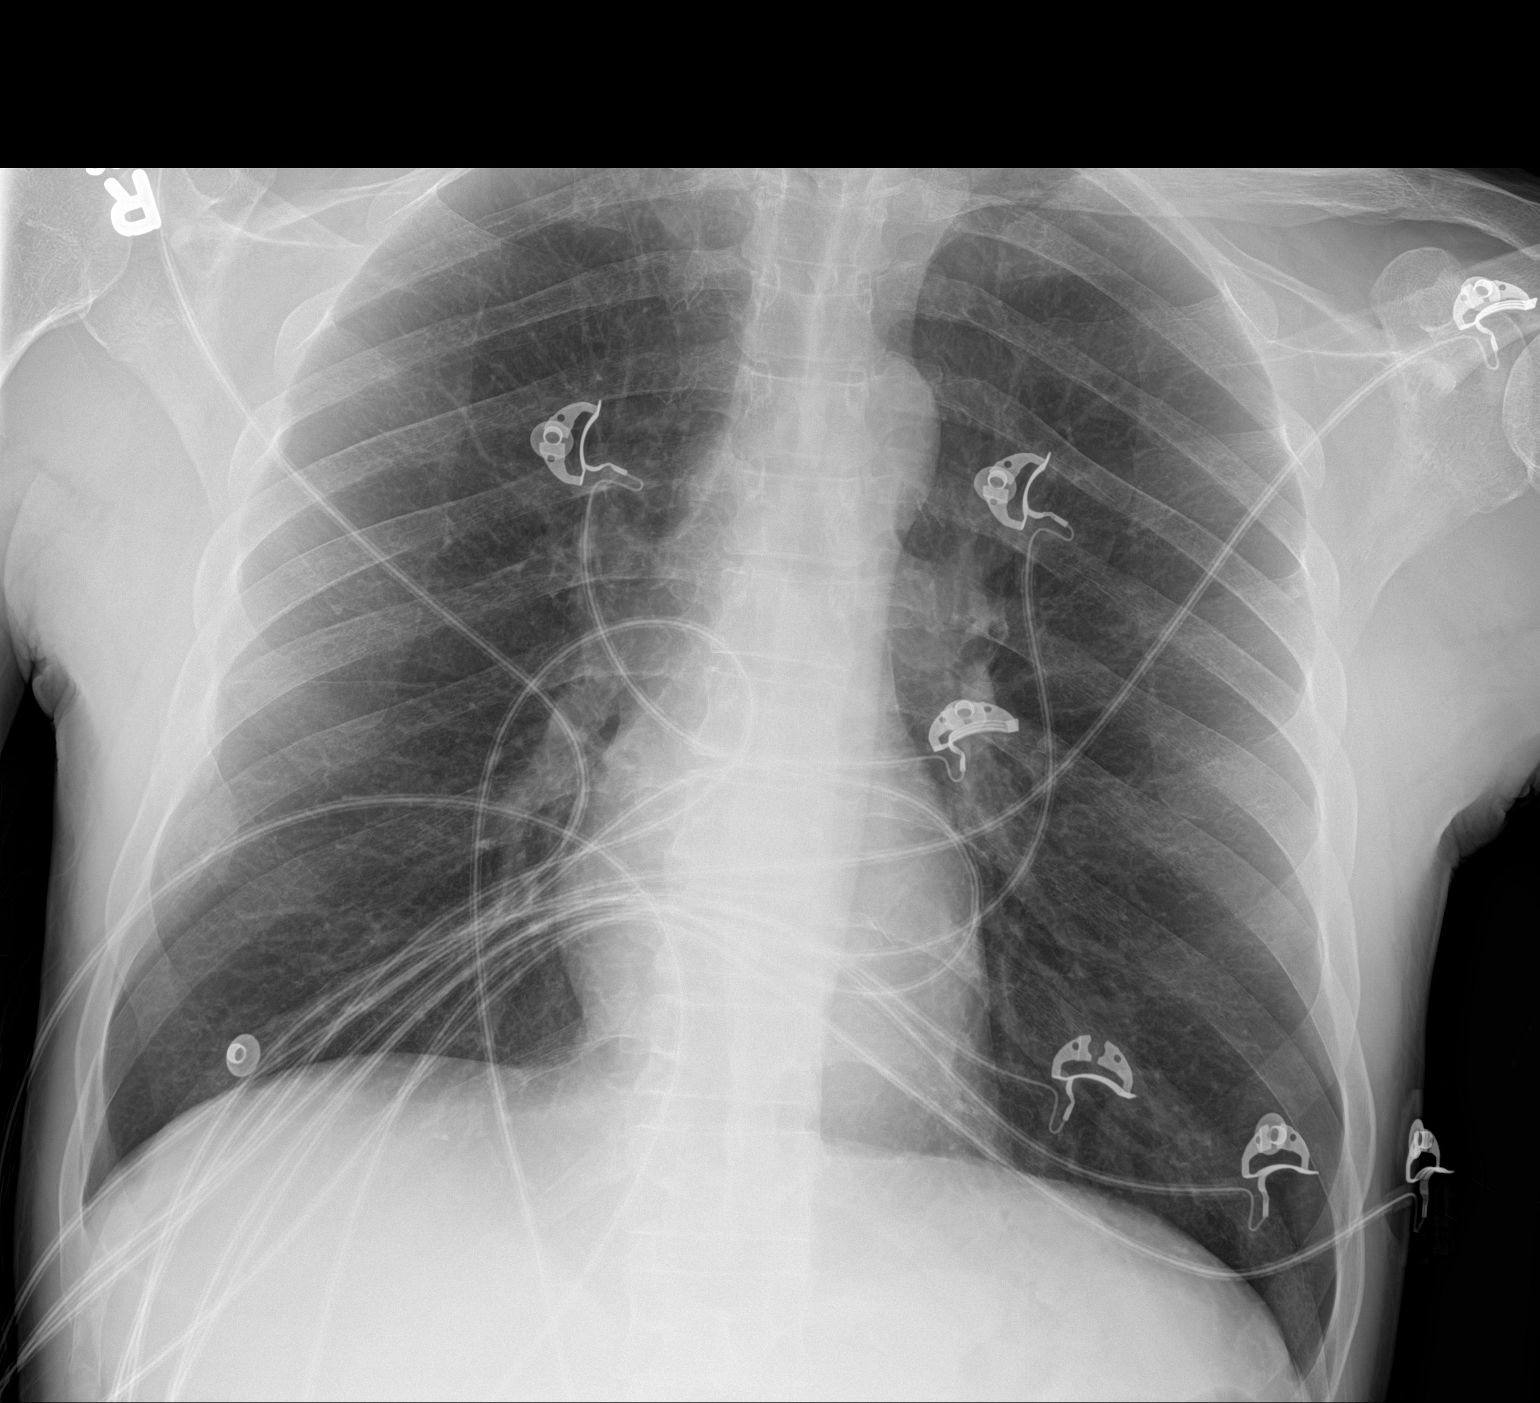

[1 of 1 positions shown; findings below may reference images not displayed]

FINDINGS: The cardiomediastinal silhouette is unremarkable.

There is no evidence of focal airspace disease, pulmonary edema,
suspicious pulmonary nodule/mass, pleural effusion, or pneumothorax.

No acute bony abnormalities are identified.
IMPRESSION: No active disease.

## 2021-07-02 NOTE — Telephone Encounter (Signed)
Inbound call from patient states he is uncomfortable with going to [facility] pain management because they are to stick a needle in his back and he does not want to go through with that. Reports he found another pain management center that he would like the referral and records to go to. [facility].   Says hemoglobin is 9.6 based on recent labs.  Best contact number [phone]

## 2021-07-05 NOTE — Telephone Encounter (Signed)
Patient called to see if referral can be sent to [facility]. Please advise.

## 2021-07-13 ENCOUNTER — Inpatient Hospital Stay: Payer: Medicaid Other | Admitting: Internal Medicine

## 2021-07-13 ENCOUNTER — Other Ambulatory Visit: Payer: Self-pay | Admitting: Gastroenterology

## 2021-07-24 ENCOUNTER — Emergency Department (HOSPITAL_COMMUNITY)
Admission: EM | Admit: 2021-07-24 | Discharge: 2021-07-24 | Disposition: A | Discharge status: Home / Self Care | Payer: Medicaid Other | Attending: Emergency Medicine | Admitting: Emergency Medicine

## 2021-07-24 ENCOUNTER — Emergency Department (HOSPITAL_COMMUNITY): Payer: Medicaid Other

## 2021-07-24 ENCOUNTER — Encounter (HOSPITAL_COMMUNITY): Payer: Self-pay | Admitting: Emergency Medicine

## 2021-07-24 ENCOUNTER — Other Ambulatory Visit: Payer: Self-pay

## 2021-07-24 DIAGNOSIS — Z91040 Latex allergy status: Secondary | ICD-10-CM | POA: Insufficient documentation

## 2021-07-24 DIAGNOSIS — R1013 Epigastric pain: Secondary | ICD-10-CM | POA: Diagnosis not present

## 2021-07-24 DIAGNOSIS — F1090 Alcohol use, unspecified, uncomplicated: Secondary | ICD-10-CM | POA: Insufficient documentation

## 2021-07-24 DIAGNOSIS — Z79899 Other long term (current) drug therapy: Secondary | ICD-10-CM | POA: Insufficient documentation

## 2021-07-24 DIAGNOSIS — I10 Essential (primary) hypertension: Secondary | ICD-10-CM

## 2021-07-24 DIAGNOSIS — R000 Tachycardia, unspecified: Secondary | ICD-10-CM | POA: Insufficient documentation

## 2021-07-24 DIAGNOSIS — Z7982 Long term (current) use of aspirin: Secondary | ICD-10-CM | POA: Insufficient documentation

## 2021-07-24 DIAGNOSIS — F1490 Cocaine use, unspecified, uncomplicated: Secondary | ICD-10-CM | POA: Diagnosis not present

## 2021-07-24 DIAGNOSIS — R197 Diarrhea, unspecified: Secondary | ICD-10-CM | POA: Insufficient documentation

## 2021-07-24 DIAGNOSIS — R079 Chest pain, unspecified: Secondary | ICD-10-CM | POA: Diagnosis present

## 2021-07-24 DIAGNOSIS — R072 Precordial pain: Secondary | ICD-10-CM

## 2021-07-24 LAB — COMPREHENSIVE METABOLIC PANEL
ALT: 29 U/L (ref 0–44)
AST: 43 U/L — ABNORMAL HIGH (ref 15–41)
Albumin: 4 g/dL (ref 3.5–5.0)
Alkaline Phosphatase: 86 U/L (ref 38–126)
Anion gap: 13 (ref 5–15)
BUN: 7 mg/dL (ref 6–20)
CO2: 18 mmol/L — ABNORMAL LOW (ref 22–32)
Calcium: 8.8 mg/dL — ABNORMAL LOW (ref 8.9–10.3)
Chloride: 109 mmol/L (ref 98–111)
Creatinine, Ser: 1.2 mg/dL (ref 0.61–1.24)
GFR, Estimated: >60 mL/min (ref >60)
Glucose, Bld: 121 mg/dL — ABNORMAL HIGH (ref 70–99)
Potassium: 4.4 mmol/L (ref 3.5–5.1)
Sodium: 140 mmol/L (ref 135–145)
Total Bilirubin: 0.7 mg/dL (ref 0.3–1.2)
Total Protein: 7.5 g/dL (ref 6.5–8.1)

## 2021-07-24 LAB — URINALYSIS, ROUTINE W REFLEX MICROSCOPIC
Bilirubin Urine: NEGATIVE
Glucose, UA: NEGATIVE mg/dL
Hgb urine dipstick: NEGATIVE
Ketones, ur: NEGATIVE mg/dL
Leukocytes,Ua: NEGATIVE
Nitrite: NEGATIVE
Protein, ur: NEGATIVE mg/dL
Specific Gravity, Urine: 1.014 (ref 1.005–1.030)
pH: 5 (ref 5.0–8.0)

## 2021-07-24 LAB — CBC WITH DIFFERENTIAL/PLATELET
Abs Immature Granulocytes: 0.03 10*3/uL (ref 0.00–0.07)
Basophils Absolute: 0.1 10*3/uL (ref 0.0–0.1)
Basophils Relative: 1 %
Eosinophils Absolute: 0.3 10*3/uL (ref 0.0–0.5)
Eosinophils Relative: 3 %
HCT: 31.6 % — ABNORMAL LOW (ref 39.0–52.0)
Hemoglobin: 10.5 g/dL — ABNORMAL LOW (ref 13.0–17.0)
Immature Granulocytes: 0 %
Lymphocytes Relative: 29 %
Lymphs Abs: 2.7 10*3/uL (ref 0.7–4.0)
MCH: 29.2 pg (ref 26.0–34.0)
MCHC: 33.2 g/dL (ref 30.0–36.0)
MCV: 88 fL (ref 80.0–100.0)
Monocytes Absolute: 0.6 10*3/uL (ref 0.1–1.0)
Monocytes Relative: 7 %
Neutro Abs: 5.8 10*3/uL (ref 1.7–7.7)
Neutrophils Relative %: 60 %
Platelets: 204 10*3/uL (ref 150–400)
RBC: 3.59 MIL/uL — ABNORMAL LOW (ref 4.22–5.81)
RDW: 18.3 % — ABNORMAL HIGH (ref 11.5–15.5)
WBC: 9.5 10*3/uL (ref 4.0–10.5)
nRBC: 0 % (ref 0.0–0.2)

## 2021-07-24 LAB — TROPONIN I (HIGH SENSITIVITY)
Troponin I (High Sensitivity): 12 ng/L (ref <18)
Troponin I (High Sensitivity): 11 ng/L (ref <18)

## 2021-07-24 LAB — RAPID URINE DRUG SCREEN, HOSP PERFORMED
Amphetamines: NOT DETECTED
Barbiturates: NOT DETECTED
Benzodiazepines: NOT DETECTED
Cocaine: POSITIVE — AB
Opiates: POSITIVE — AB
Tetrahydrocannabinol: NOT DETECTED

## 2021-07-24 LAB — LIPASE, BLOOD: Lipase: 40 U/L (ref 11–51)

## 2021-07-24 MED ORDER — NITROGLYCERIN 0.4 MG SL SUBL
0.4000 mg | SUBLINGUAL_TABLET | SUBLINGUAL | Status: DC | PRN
  Administered 2021-07-24 (×2): 0.4 mg via SUBLINGUAL
  Filled 2021-07-24 (×2): qty 1

## 2021-07-24 MED ORDER — LACTATED RINGERS IV BOLUS
1000.0000 mL | Freq: Once | INTRAVENOUS | Status: AC
  Administered 2021-07-24: 1000 mL via INTRAVENOUS

## 2021-07-24 MED ORDER — LIDOCAINE VISCOUS HCL 2 % MT SOLN
15.0000 mL | Freq: Once | OROMUCOSAL | Status: AC
  Administered 2021-07-24: 15 mL via ORAL
  Filled 2021-07-24: qty 15

## 2021-07-24 MED ORDER — ALUM & MAG HYDROXIDE-SIMETH 200-200-20 MG/5ML PO SUSP
30.0000 mL | Freq: Once | ORAL | Status: AC
  Administered 2021-07-24: 30 mL via ORAL
  Filled 2021-07-24: qty 30

## 2021-07-24 MED ORDER — MORPHINE SULFATE (PF) 4 MG/ML IV SOLN
4.0000 mg | Freq: Once | INTRAVENOUS | Status: AC
  Administered 2021-07-24: 4 mg via INTRAVENOUS
  Filled 2021-07-24: qty 1

## 2021-07-24 MED ORDER — ASPIRIN 81 MG PO CHEW
324.0000 mg | CHEWABLE_TABLET | Freq: Once | ORAL | Status: AC
  Administered 2021-07-24: 324 mg via ORAL
  Filled 2021-07-24: qty 4

## 2021-07-24 NOTE — ED Triage Notes (Signed)
Pt arrives via EMS with complaints of left sided chest pain/epigastric pain that started about an hour ago. Diarrhea, no nausea. 7/10 pain, decreased to 4/10 after '4mg'$  of Morphine from EMS. HR 122- with PACs. BP 180/100.  ?20ga left forearm. CBG 114, 99% on room air. ?

## 2021-07-24 NOTE — ED Notes (Signed)
Pt states his last drink was Thursday. He usually drinks daily. He has been trying to slow down. Pt has visible tremors, reported headache, diarrhea, and is tachycardic. ?

## 2021-07-24 NOTE — Discharge Instructions (Addendum)
You came to the emergency department today to be evaluated for your chest pain and stomach pain.  Your lab work was reassuring.  You were able to tolerate p.o. intake without difficulty.  It is very important that you stop using cocaine as this can cause dangerous interactions with your metoprolol medication.  Please continue to take this medication moving forward.  Please follow-up closely with your cardiologist. ? ?Your lipase was within normal limits and your abdominal exam is reassuring.  There is a low suspicion for acute pancreatitis at this time.  Please make sure to follow-up closely with your gastroenterologist. ? ?Please return to the emergency department if you have new or concerning symptoms. ?

## 2021-07-24 NOTE — ED Provider Notes (Signed)
?[facility] ?Provider Note ? ? ?CSN: [id number] ?Arrival date & time: 07/24/21  0263 ? ?  ? ?History ? ?Chief Complaint  ?Patient presents with  ? Chest Pain  ? ? ?[name] is a 51 y.o. male With past medical history of chronic alcohol use, acute pancreatitis, hypertension, chronic tobacco use.  Presents to the emergency department with a chief complaint of chest pain and abdominal pain.  Reports that his chest pain and abdominal pain started 2 hours prior.  Chest pain started while he was laying in bed.  Pain is located to his left chest and does not radiate.  Describes pain as a "sharp stabbing."  Patient endorses associated palpitations and lightheadedness.  No associated nausea, vomiting, diaphoresis, shortness of breath.  Patient has not tried any medications to alleviate his pain. ? ?Patient reports that abdominal pain began at the same time of his chest pain.  Pain is located to epigastric area and radiates into his chest as well as left upper quadrant.  Patient rates pain 7/10 on the pain scale.  Denies any associated nausea or vomiting.  Patient does endorse diarrhea over the last 24 hours.  Reports 5 episodes of diarrhea over this time.  No blood in stool or melena associated with diarrhea.  Patient does report that he had 1 episode of melena on Tuesday of this week.  Patient endorses daily alcohol use.  States that he had 312 ounce beers on Thursday and 212 ounce beers yesterday.  Additionally patient endorsed cocaine use 4 days prior. ? ?Patient denies any fever, chills abdominal distention, dysuria, hematuria, urinary urgency, or tenderness genitals, penile discharge, syncope, numbness, weakness. ? ?Denies any history of abdominal surgery.  Patient reports that he has been compliant with his medications.  Did not take morning medications. ? ? ?Chest Pain ?Associated symptoms: palpitations   ?Associated symptoms: no abdominal pain, no back pain, no  dizziness, no fever, no headache, no nausea, no numbness, no shortness of breath, no vomiting and no weakness   ? ?  ? ?Home Medications ?Prior to Admission medications   ?Medication Sig Start Date End Date Taking? Authorizing Provider  ?acetaminophen (TYLENOL) 500 MG tablet Take 500 mg by mouth every 6 (six) hours as needed for moderate pain or headache.    [provider]  ?albuterol (VENTOLIN HFA) 108 (90 Base) MCG/ACT inhaler Inhale 2 puffs into the lungs every 4 (four) hours as needed for wheezing or shortness of breath. 11/12/19   [name], DO  ?amoxicillin-clavulanate (AUGMENTIN) 875-125 MG tablet Take 1 tablet by mouth every 12 (twelve) hours. 06/23/21   [name], [name], MD  ?cholecalciferol (VITAMIN D) 25 MCG tablet Take 1 tablet (1,000 Units total) by mouth daily. 10/30/20   [name], MD  ?fluticasone furoate-vilanterol (BREO ELLIPTA) 200-25 MCG/INH AEPB Inhale 1 puff into the lungs daily. ?Patient taking differently: Inhale 1 puff into the lungs daily as needed (asthma). 11/12/19   [name], DO  ?folic acid (FOLVITE) 1 MG tablet Take 1 tablet (1 mg total) by mouth daily. ?Patient not taking: Reported on 05/26/2021 11/26/17   [name], MD  ?gabapentin (NEURONTIN) 100 MG capsule Take 100 mg by mouth 3 (three) times daily. 05/17/21   [provider]  ?lidocaine (XYLOCAINE) 2 % solution Use as directed 15 mLs in the mouth or throat every 6 (six) hours as needed for mouth pain. 11/21/20   [name], [name], DO  ?lipase/protease/amylase (CREON) 36000 UNITS  CPEP capsule Take 2 capsules (72,000 Units total) by mouth 3 (three) times daily with meals. For pancreatitis 11/05/19   [name], [name], MD  ?magnesium oxide (MAG-OX) 400 (240 Mg) MG tablet Take 400 mg by mouth daily. 12/24/20   [provider]  ?methocarbamol (ROBAXIN) 750 MG tablet Take 750 mg by mouth every 6 (six) hours as needed for muscle spasms. 05/17/21   [provider]  ?metoprolol (TOPROL-XL) 200  MG 24 hr tablet Take 200 mg by mouth daily. 10/29/20   [provider]  ?mirtazapine (REMERON) 15 MG tablet Take 15 mg by mouth at bedtime. 05/17/21   [provider]  ?Multiple Vitamin (MULTIVITAMIN WITH MINERALS) TABS tablet Take 1 tablet by mouth daily. 09/06/17   Bonnell Public, MD  ?omeprazole (PRILOSEC) 40 MG capsule Take 1 capsule (40 mg total) by mouth 2 (two) times daily as needed (acid reflex). 07/13/21   [name], [name], MD  ?ondansetron (ZOFRAN-ODT) 4 MG disintegrating tablet Take 1 tablet (4 mg total) by mouth every 8 (eight) hours as needed for vomiting or nausea. 05/24/21   Quintella Reichert, MD  ?OVER THE COUNTER MEDICATION Take 1 tablet by mouth daily. Iron supplement.    [provider]  ?oxyCODONE-acetaminophen (PERCOCET/ROXICET) 5-325 MG tablet Take 1 tablet by mouth every 6 (six) hours as needed for moderate pain. 05/17/21   [provider]  ?Polyethyl Glycol-Propyl Glycol (SYSTANE OP) Place 1 drop into both eyes daily as needed (dry eyes).    [provider]  ?sertraline (ZOLOFT) 100 MG tablet Take 1 tablet (100 mg total) by mouth daily. 03/27/20   [name], MD  ?sucralfate (CARAFATE) 1 g tablet Take 1 tablet (1 g total) by mouth 4 (four) times daily -  with meals and at bedtime. 04/11/21   Rancour, Annie Main, MD  ?sucralfate (CARAFATE) 1 GM/10ML suspension Take 10 MLs BY MOUTH FOUR TIMES DAILY with meals AND AT BEDTIME ?Patient taking differently: Take 1 g by mouth in the morning, at noon, in the evening, and at bedtime. 06/01/21   [name], [name], MD  ?thiamine 100 MG tablet Take 1 tablet (100 mg total) by mouth daily. 10/16/18   [name], MD  ?Vitamin D, Ergocalciferol, (DRISDOL) 1.25 MG (50000 UNIT) CAPS capsule Take 50,000 Units by mouth every Wednesday.    [provider]  ?amitriptyline (ELAVIL) 25 MG tablet Take 1 tablet (25 mg total) by mouth at bedtime. ?Patient not taking: Reported on 08/08/2019 10/15/18 08/08/19  [name], MD  ?promethazine (PHENERGAN) 25 MG tablet Take 1 tablet (25 mg total) by mouth every 6 (six) hours as needed for nausea or vomiting. ?Patient not taking: Reported on 08/08/2019 03/02/19 08/08/19  Kinnie Feil, PA-C  ?   ? ?Allergies    ?Robaxin [methocarbamol], Aspirin, Shellfish-derived products, Trazodone and nefazodone, Adhesive [tape], Latex, Toradol [ketorolac tromethamine], Contrast media [iodinated contrast media], and Reglan [metoclopramide]   ? ?Review of Systems   ?Review of Systems  ?Constitutional:  Negative for chills and fever.  ?Eyes:  Negative for visual disturbance.  ?Respiratory:  Negative for shortness of breath.   ?Cardiovascular:  Positive for chest pain and palpitations. Negative for leg swelling.  ?Gastrointestinal:  Positive for diarrhea. Negative for abdominal distention, abdominal pain, anal bleeding, blood in stool, constipation, nausea, rectal pain and vomiting.  ?Genitourinary:  Positive for frequency. Negative for difficulty urinating, dysuria, genital sores, hematuria, penile discharge, penile pain, penile swelling, scrotal swelling, testicular pain and urgency.  ?Musculoskeletal:  Negative for back pain and neck pain.  ?Skin:  Negative for color change and rash.  ?Neurological:  Positive for light-headedness. Negative for dizziness, syncope, weakness, numbness and headaches.  ?Psychiatric/Behavioral:  Negative for confusion.   ? ?Physical Exam ?Updated Vital Signs ?BP (!) 198/112 (BP Location: Right Arm)   Pulse (!) 126   Temp 98.8 ?F (37.1 ?C) (Oral)   Resp 20   SpO2 100%  ?Physical Exam ?Vitals and nursing note reviewed.  ?Constitutional:   ?   General: He is not in acute distress. ?   Appearance: He is not ill-appearing, toxic-appearing or diaphoretic.  ?   Comments: Appears uncomfortable due to complaints of pain  ?HENT:  ?   Head: Normocephalic.  ?Eyes:  ?   General: No scleral icterus.    ?   Right eye: No discharge.     ?   Left eye: No discharge.   ?Cardiovascular:  ?   Rate and Rhythm: Tachycardia present.  ?   Pulses:     ?     Radial pulses are 2+ on the right side and 2+ on the left side.  ?   Heart sounds: Normal heart sounds, S1 normal and S2 normal.  ?Pulmonary:

## 2021-08-06 ENCOUNTER — Emergency Department (HOSPITAL_COMMUNITY): Payer: Medicaid Other

## 2021-08-06 ENCOUNTER — Emergency Department (HOSPITAL_COMMUNITY)
Admission: EM | Admit: 2021-08-06 | Discharge: 2021-08-06 | Disposition: A | Discharge status: Home / Self Care | Payer: Medicaid Other | Attending: Emergency Medicine | Admitting: Emergency Medicine

## 2021-08-06 ENCOUNTER — Encounter (HOSPITAL_COMMUNITY): Payer: Self-pay

## 2021-08-06 ENCOUNTER — Other Ambulatory Visit: Payer: Self-pay

## 2021-08-06 DIAGNOSIS — I10 Essential (primary) hypertension: Secondary | ICD-10-CM | POA: Diagnosis not present

## 2021-08-06 DIAGNOSIS — R1013 Epigastric pain: Secondary | ICD-10-CM | POA: Insufficient documentation

## 2021-08-06 DIAGNOSIS — R072 Precordial pain: Secondary | ICD-10-CM | POA: Diagnosis not present

## 2021-08-06 DIAGNOSIS — R7989 Other specified abnormal findings of blood chemistry: Secondary | ICD-10-CM | POA: Diagnosis not present

## 2021-08-06 DIAGNOSIS — D72829 Elevated white blood cell count, unspecified: Secondary | ICD-10-CM | POA: Diagnosis not present

## 2021-08-06 DIAGNOSIS — J45909 Unspecified asthma, uncomplicated: Secondary | ICD-10-CM | POA: Diagnosis not present

## 2021-08-06 DIAGNOSIS — D649 Anemia, unspecified: Secondary | ICD-10-CM | POA: Insufficient documentation

## 2021-08-06 LAB — CBC WITH DIFFERENTIAL/PLATELET
Abs Immature Granulocytes: 0.05 10*3/uL (ref 0.00–0.07)
Basophils Absolute: 0 10*3/uL (ref 0.0–0.1)
Basophils Relative: 0 %
Eosinophils Absolute: 0.1 10*3/uL (ref 0.0–0.5)
Eosinophils Relative: 1 %
HCT: 33.5 % — ABNORMAL LOW (ref 39.0–52.0)
Hemoglobin: 10.5 g/dL — ABNORMAL LOW (ref 13.0–17.0)
Immature Granulocytes: 0 %
Lymphocytes Relative: 16 %
Lymphs Abs: 2 10*3/uL (ref 0.7–4.0)
MCH: 28.9 pg (ref 26.0–34.0)
MCHC: 31.3 g/dL (ref 30.0–36.0)
MCV: 92.3 fL (ref 80.0–100.0)
Monocytes Absolute: 1.2 10*3/uL — ABNORMAL HIGH (ref 0.1–1.0)
Monocytes Relative: 10 %
Neutro Abs: 9 10*3/uL — ABNORMAL HIGH (ref 1.7–7.7)
Neutrophils Relative %: 73 %
Platelets: 271 10*3/uL (ref 150–400)
RBC: 3.63 MIL/uL — ABNORMAL LOW (ref 4.22–5.81)
RDW: 18.8 % — ABNORMAL HIGH (ref 11.5–15.5)
WBC: 12.5 10*3/uL — ABNORMAL HIGH (ref 4.0–10.5)
nRBC: 0 % (ref 0.0–0.2)

## 2021-08-06 LAB — COMPREHENSIVE METABOLIC PANEL
ALT: 33 U/L (ref 0–44)
AST: 57 U/L — ABNORMAL HIGH (ref 15–41)
Albumin: 4.4 g/dL (ref 3.5–5.0)
Alkaline Phosphatase: 88 U/L (ref 38–126)
Anion gap: 14 (ref 5–15)
BUN: 14 mg/dL (ref 6–20)
CO2: 16 mmol/L — ABNORMAL LOW (ref 22–32)
Calcium: 7.8 mg/dL — ABNORMAL LOW (ref 8.9–10.3)
Chloride: 104 mmol/L (ref 98–111)
Creatinine, Ser: 1.39 mg/dL — ABNORMAL HIGH (ref 0.61–1.24)
GFR, Estimated: >60 mL/min (ref >60)
Glucose, Bld: 74 mg/dL (ref 70–99)
Potassium: 3.6 mmol/L (ref 3.5–5.1)
Sodium: 134 mmol/L — ABNORMAL LOW (ref 135–145)
Total Bilirubin: 0.9 mg/dL (ref 0.3–1.2)
Total Protein: 8.2 g/dL — ABNORMAL HIGH (ref 6.5–8.1)

## 2021-08-06 LAB — LIPASE, BLOOD: Lipase: 25 U/L (ref 11–51)

## 2021-08-06 LAB — TROPONIN I (HIGH SENSITIVITY): Troponin I (High Sensitivity): 14 ng/L (ref <18)

## 2021-08-06 MED ORDER — MORPHINE SULFATE (PF) 4 MG/ML IV SOLN
4.0000 mg | Freq: Once | INTRAVENOUS | Status: AC
  Administered 2021-08-06: 4 mg via INTRAVENOUS
  Filled 2021-08-06: qty 1

## 2021-08-06 MED ORDER — SODIUM CHLORIDE 0.9 % IV BOLUS
1000.0000 mL | Freq: Once | INTRAVENOUS | Status: AC
  Administered 2021-08-06: 1000 mL via INTRAVENOUS

## 2021-08-06 MED ORDER — ONDANSETRON HCL 4 MG/2ML IJ SOLN
4.0000 mg | Freq: Once | INTRAMUSCULAR | Status: AC
  Administered 2021-08-06: 4 mg via INTRAVENOUS
  Filled 2021-08-06: qty 2

## 2021-08-06 MED ORDER — OXYCODONE-ACETAMINOPHEN 5-325 MG PO TABS
1.0000 | ORAL_TABLET | Freq: Once | ORAL | Status: AC
  Administered 2021-08-06: 1 via ORAL
  Filled 2021-08-06: qty 1

## 2021-08-06 NOTE — ED Triage Notes (Signed)
Pt bib [facility] c/o sharp 8/10 cp and abd pain. 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 States the pain feels similar to when he was first diagnosed. Axo x4.  ?

## 2021-08-06 NOTE — ED Notes (Signed)
Patient transported to X-ray 

## 2021-08-06 NOTE — ED Provider Notes (Signed)
? ?Emergency Department Provider Note ? ? ?I have reviewed the triage vital signs and the nursing notes. ? ? ?HISTORY ? ?Chief Complaint ?Chest Pain ? ? ?HPI ?[name] is a 51 y.o. male with PMH reviewed below including chronic pain and polysubstance abuse presents to the emergency department for evaluation of epigastric abdominal pain radiating to the chest.  Symptoms began today.  He denies any cocaine use today but states he last used cocaine 3 days ago.  He did drink small amount of alcohol.  He describes pain in the abdomen of 8/10 in severity.  He notes a prior history of WPW and notes that some component of this feels the same. No fever. He has been vomiting but denies hematemesis.  ? ?Past Medical History:  ?Diagnosis Date  ? Alcoholism /alcohol abuse   ? Anemia   ? Anxiety   ? Arthritis   ? "knees; arms; elbows" (03/26/2015)  ? Asthma   ? Bipolar disorder ([place])   ? Chronic bronchitis ([place])   ? Chronic lower back pain   ? Chronic pancreatitis ([place])   ? Cocaine abuse ([place])   ? Depression   ? Family history of adverse reaction to anesthesia   ? "grandmother gets confused"  ? Femoral condyle fracture ([place]) 03/08/2014  ? left medial/notes 03/09/2014  ? GERD (gastroesophageal reflux disease)   ? H/O hiatal hernia   ? H/O suicide attempt 10/2012  ? High cholesterol   ? History of blood transfusion 10/2012  ? "when I tried to commit suicide"  ? History of stomach ulcers   ? Hypertension   ? Marijuana abuse, continuous   ? Migraine   ? "a few times/year" (03/26/2015)  ? Pneumonia 1990's X 3  ? PTSD (post-traumatic stress disorder)   ? Sickle cell trait ([place])   ? WPW (Wolff-Parkinson-White syndrome)   ? [name] 03/06/2013  ? ? ?Review of Systems ? ?Constitutional: No fever/chills ?Eyes: No visual changes. ?ENT: No sore throat. ?Cardiovascular: Positive chest pain. ?Respiratory: Denies shortness of breath. ?Gastrointestinal: Positive epigastric abdominal pain. Positive nausea and vomiting.  No diarrhea.  No  constipation. ?Genitourinary: Negative for dysuria. ?Musculoskeletal: Negative for back pain. ?Skin: Negative for rash. ?Neurological: Negative for headaches, focal weakness or numbness. ? ? ?____________________________________________ ? ? ?PHYSICAL EXAM: ? ?VITAL SIGNS: ?ED Triage Vitals  ?Enc Vitals Group  ?   BP 08/06/21 2040 (!) 183/119  ?   Pulse Rate 08/06/21 2040 (!) 113  ?   Resp 08/06/21 2040 (!) 22  ?   Temp 08/06/21 2040 98 ?F (36.7 ?C)  ?   Temp Source 08/06/21 2040 Oral  ?   SpO2 08/06/21 2040 100 %  ?   Weight 08/06/21 2037 137 lb (62.1 kg)  ?   Height 08/06/21 2037 '5\' 8"'$  (1.727 m)  ? ?Constitutional: Alert and oriented. Appears slightly uncomfortable.  ?Eyes: Conjunctivae are normal.  ?Head: Atraumatic. ?Nose: No congestion/rhinnorhea. ?Mouth/Throat: Mucous membranes are slightly dry.  ?Neck: No stridor.   ?Cardiovascular: Tachycardia. Good peripheral circulation. Grossly normal heart sounds.   ?Respiratory: Normal respiratory effort.  No retractions. Lungs CTAB. ?Gastrointestinal: Soft and nontender. No rebound or guarding. No distention.  ?Musculoskeletal: No lower extremity tenderness nor edema. No gross deformities of extremities. ?Neurologic:  Normal speech and language.  ?Skin:  Skin is warm, dry and intact. No rash noted. ? ? ?____________________________________________ ?  ?LABS ?(all labs ordered are listed, but only abnormal results are displayed) ? ?Labs Reviewed  ?COMPREHENSIVE METABOLIC PANEL -  Abnormal; Notable for the following components:  ?    Result Value  ? Sodium 134 (*)   ? CO2 16 (*)   ? Creatinine, Ser 1.39 (*)   ? Calcium 7.8 (*)   ? Total Protein 8.2 (*)   ? AST 57 (*)   ? All other components within normal limits  ?CBC WITH DIFFERENTIAL/PLATELET - Abnormal; Notable for the following components:  ? WBC 12.5 (*)   ? RBC 3.63 (*)   ? Hemoglobin 10.5 (*)   ? HCT 33.5 (*)   ? RDW 18.8 (*)   ? Neutro Abs 9.0 (*)   ? Monocytes Absolute 1.2 (*)   ? All other components within normal  limits  ?LIPASE, BLOOD  ?TROPONIN I (HIGH SENSITIVITY)  ?TROPONIN I (HIGH SENSITIVITY)  ? ?____________________________________________ ? ?EKG ? ? EKG Interpretation ? ?Date/Time:  Friday August 06 2021 20:39:08 EDT ?Ventricular Rate:  111 ?PR Interval:  110 ?QRS Duration: 77 ?QT Interval:  320 ?QTC Calculation: 435 ?R Axis:   75 ?Text Interpretation: Sinus tachycardia Paired ventricular premature complexes LAE, consider biatrial enlargement Repol abnrm suggests ischemia, inferior leads Similar to prior Confirmed by Nanda Quinton [phone]) on 08/06/2021 8:50:23 PM ?  ? ?  ? ? ?____________________________________________ ? ?RADIOLOGY ? ?DG Chest 2 View ? ?Result Date: 08/06/2021 ?CLINICAL DATA:  chest pain EXAM: CHEST - 2 VIEW COMPARISON:  Chest x-ray 07/24/2021, CT chest 11/26/2014 FINDINGS: The heart and mediastinal contours are within normal limits. Biapical pleural/pulmonary scarring emphysematous changes. No focal consolidation. No pulmonary edema. No pleural effusion. No pneumothorax. No acute osseous abnormality. IMPRESSION: 1. No active cardiopulmonary disease. 2.  Emphysema (ICD10-J43.9). Electronically Signed   By: Iven Finn M.D.   On: 08/06/2021 21:40   ? ?____________________________________________ ? ? ?PROCEDURES ? ?Procedure(s) performed:  ? ?Procedures ? ?None ?____________________________________________ ? ? ?INITIAL IMPRESSION / ASSESSMENT AND PLAN / ED COURSE ? ?Pertinent labs & imaging results that were available during my care of the patient were reviewed by me and considered in my medical decision making (see chart for details). ?  ?This patient is Presenting for Evaluation of abdominal pain, which does require a range of treatment options, and is a complaint that involves a high risk of morbidity and mortality. ? ?The Differential Diagnoses includes but is not exclusive to acute cholecystitis, intrathoracic causes for epigastric abdominal pain, gastritis, duodenitis, pancreatitis, small bowel  or large bowel obstruction, abdominal aortic aneurysm, hernia, gastritis, etc. ? ? ?Critical Interventions-  ?  ?Medications  ?sodium chloride 0.9 % bolus 1,000 mL (0 mLs Intravenous Stopped 08/06/21 2255)  ?ondansetron [facility]) injection 4 mg (4 mg Intravenous Given 08/06/21 2113)  ?morphine (PF) 4 MG/ML injection 4 mg (4 mg Intravenous Given 08/06/21 2113)  ?oxyCODONE-acetaminophen (PERCOCET/ROXICET) 5-325 MG per tablet 1 tablet (1 tablet Oral Given 08/06/21 2329)  ? ? ?Reassessment after intervention:  Pain improved.  ? ? ?I decided to review pertinent External Data, and in summary Patient with ED evaluation earlier this month for similar. Patient with 11 ED visits in our system for this in the last 6 months.  ?  ?Clinical Laboratory Tests Ordered, included CBC with mild leukocytosis to 12.5.  Hemoglobin 10.5.  Creatinine 1.39 with no significant electrolyte disturbance. Lipase negative. Troponin WNL.  ? ?Radiologic Tests Ordered, included CXR. I independently interpreted the images and agree with radiology interpretation.  ? ?Cardiac Monitor Tracing which shows NSR. ? ? ?Social Determinants of Health Risk history of substance abuse.  ? ?Medical Decision Making:  Summary:  ?Patient presents to the emergency room with acute on chronic epigastric pain radiating into the chest.  EKG shows sinus tachycardia.  Troponin is within normal limits.  No acute ischemic change on EKG.  Abdomen is diffusely soft and nontender without peritonitis.  Do not plan for imaging.  ? ?Reevaluation with update and discussion with patient. Symptoms improved. Plan for discharge with PCP follow up.  ? ?Disposition: discharge ? ?____________________________________________ ? ?FINAL CLINICAL IMPRESSION(S) / ED DIAGNOSES ? ?Final diagnoses:  ?Epigastric pain  ?Precordial pain  ? ? ? ? ?Note:  This document was prepared using Dragon voice recognition software and may include unintentional dictation errors. ? ?Nanda Quinton, MD, FACEP ?Emergency  Medicine ? ?  ?Margette Fast, MD ?08/11/21 1026 ? ?

## 2021-08-06 NOTE — Discharge Instructions (Signed)
You have been seen in the Emergency Department (ED) for abdominal pain.  Your evaluation did not identify a clear cause of your symptoms but was generally reassuring.  Please follow up as instructed above regarding today's emergent visit and the symptoms that are bothering you.  Return to the ED if your abdominal pain worsens or fails to improve, you develop bloody vomiting, bloody diarrhea, you are unable to tolerate fluids due to vomiting, fever greater than 101, or other symptoms that concern you.  

## 2021-08-06 NOTE — ED Notes (Signed)
RN reviewed discharge instructions with pt. Pt verbalized understanding and had no further questions. VSS upon discharge.  

## 2021-08-07 LAB — TROPONIN I (HIGH SENSITIVITY): Troponin I (High Sensitivity): 13 ng/L (ref <18)

## 2021-08-14 ENCOUNTER — Emergency Department (HOSPITAL_COMMUNITY): Payer: Medicaid Other

## 2021-08-14 ENCOUNTER — Encounter (HOSPITAL_COMMUNITY): Payer: Self-pay | Admitting: Internal Medicine

## 2021-08-14 ENCOUNTER — Other Ambulatory Visit: Payer: Self-pay

## 2021-08-14 ENCOUNTER — Inpatient Hospital Stay (HOSPITAL_COMMUNITY)
Admission: EM | Admit: 2021-08-14 | Discharge: 2021-08-18 | DRG: 641 | Disposition: A | Discharge status: Home / Self Care | Payer: Medicaid Other | Attending: Internal Medicine | Admitting: Internal Medicine

## 2021-08-14 DIAGNOSIS — I493 Ventricular premature depolarization: Secondary | ICD-10-CM | POA: Diagnosis present

## 2021-08-14 DIAGNOSIS — J90 Pleural effusion, not elsewhere classified: Secondary | ICD-10-CM

## 2021-08-14 DIAGNOSIS — K7010 Alcoholic hepatitis without ascites: Secondary | ICD-10-CM | POA: Diagnosis present

## 2021-08-14 DIAGNOSIS — D638 Anemia in other chronic diseases classified elsewhere: Secondary | ICD-10-CM | POA: Diagnosis present

## 2021-08-14 DIAGNOSIS — Z765 Malingerer [conscious simulation]: Secondary | ICD-10-CM

## 2021-08-14 DIAGNOSIS — D573 Sickle-cell trait: Secondary | ICD-10-CM | POA: Diagnosis present

## 2021-08-14 DIAGNOSIS — R1084 Generalized abdominal pain: Secondary | ICD-10-CM

## 2021-08-14 DIAGNOSIS — Z91013 Allergy to seafood: Secondary | ICD-10-CM

## 2021-08-14 DIAGNOSIS — F17200 Nicotine dependence, unspecified, uncomplicated: Secondary | ICD-10-CM | POA: Diagnosis present

## 2021-08-14 DIAGNOSIS — R112 Nausea with vomiting, unspecified: Secondary | ICD-10-CM | POA: Diagnosis present

## 2021-08-14 DIAGNOSIS — F4310 Post-traumatic stress disorder, unspecified: Secondary | ICD-10-CM | POA: Diagnosis present

## 2021-08-14 DIAGNOSIS — I10 Essential (primary) hypertension: Secondary | ICD-10-CM | POA: Diagnosis present

## 2021-08-14 DIAGNOSIS — Z7951 Long term (current) use of inhaled steroids: Secondary | ICD-10-CM

## 2021-08-14 DIAGNOSIS — F1210 Cannabis abuse, uncomplicated: Secondary | ICD-10-CM | POA: Diagnosis present

## 2021-08-14 DIAGNOSIS — Z808 Family history of malignant neoplasm of other organs or systems: Secondary | ICD-10-CM

## 2021-08-14 DIAGNOSIS — M7989 Other specified soft tissue disorders: Secondary | ICD-10-CM

## 2021-08-14 DIAGNOSIS — Z8711 Personal history of peptic ulcer disease: Secondary | ICD-10-CM

## 2021-08-14 DIAGNOSIS — Z886 Allergy status to analgesic agent status: Secondary | ICD-10-CM

## 2021-08-14 DIAGNOSIS — Z8249 Family history of ischemic heart disease and other diseases of the circulatory system: Secondary | ICD-10-CM

## 2021-08-14 DIAGNOSIS — Z7982 Long term (current) use of aspirin: Secondary | ICD-10-CM

## 2021-08-14 DIAGNOSIS — Z79899 Other long term (current) drug therapy: Secondary | ICD-10-CM

## 2021-08-14 DIAGNOSIS — M170 Bilateral primary osteoarthritis of knee: Secondary | ICD-10-CM | POA: Diagnosis present

## 2021-08-14 DIAGNOSIS — E876 Hypokalemia: Secondary | ICD-10-CM

## 2021-08-14 DIAGNOSIS — E8809 Other disorders of plasma-protein metabolism, not elsewhere classified: Secondary | ICD-10-CM

## 2021-08-14 DIAGNOSIS — K219 Gastro-esophageal reflux disease without esophagitis: Secondary | ICD-10-CM | POA: Diagnosis present

## 2021-08-14 DIAGNOSIS — Z91040 Latex allergy status: Secondary | ICD-10-CM

## 2021-08-14 DIAGNOSIS — F419 Anxiety disorder, unspecified: Secondary | ICD-10-CM | POA: Diagnosis present

## 2021-08-14 DIAGNOSIS — K860 Alcohol-induced chronic pancreatitis: Secondary | ICD-10-CM | POA: Diagnosis present

## 2021-08-14 DIAGNOSIS — E7800 Pure hypercholesterolemia, unspecified: Secondary | ICD-10-CM | POA: Diagnosis present

## 2021-08-14 DIAGNOSIS — F1410 Cocaine abuse, uncomplicated: Secondary | ICD-10-CM | POA: Diagnosis present

## 2021-08-14 DIAGNOSIS — G8929 Other chronic pain: Secondary | ICD-10-CM | POA: Diagnosis present

## 2021-08-14 DIAGNOSIS — R197 Diarrhea, unspecified: Secondary | ICD-10-CM | POA: Diagnosis present

## 2021-08-14 DIAGNOSIS — Z811 Family history of alcohol abuse and dependence: Secondary | ICD-10-CM

## 2021-08-14 DIAGNOSIS — F1020 Alcohol dependence, uncomplicated: Secondary | ICD-10-CM | POA: Diagnosis present

## 2021-08-14 DIAGNOSIS — Z91041 Radiographic dye allergy status: Secondary | ICD-10-CM

## 2021-08-14 DIAGNOSIS — Z6372 Alcoholism and drug addiction in family: Secondary | ICD-10-CM

## 2021-08-14 DIAGNOSIS — R601 Generalized edema: Secondary | ICD-10-CM | POA: Diagnosis present

## 2021-08-14 DIAGNOSIS — F319 Bipolar disorder, unspecified: Secondary | ICD-10-CM | POA: Diagnosis present

## 2021-08-14 LAB — URINALYSIS, ROUTINE W REFLEX MICROSCOPIC
Bilirubin Urine: NEGATIVE
Glucose, UA: NEGATIVE mg/dL
Hgb urine dipstick: NEGATIVE
Ketones, ur: NEGATIVE mg/dL
Leukocytes,Ua: NEGATIVE
Nitrite: NEGATIVE
Protein, ur: NEGATIVE mg/dL
Specific Gravity, Urine: 1.011 (ref 1.005–1.030)
pH: 5 (ref 5.0–8.0)

## 2021-08-14 LAB — COMPREHENSIVE METABOLIC PANEL
ALT: 17 U/L (ref 0–44)
AST: 29 U/L (ref 15–41)
Albumin: 2.7 g/dL — ABNORMAL LOW (ref 3.5–5.0)
Alkaline Phosphatase: 68 U/L (ref 38–126)
Anion gap: 10 (ref 5–15)
BUN: <5 mg/dL — ABNORMAL LOW (ref 6–20)
CO2: 21 mmol/L — ABNORMAL LOW (ref 22–32)
Calcium: 7.8 mg/dL — ABNORMAL LOW (ref 8.9–10.3)
Chloride: 113 mmol/L — ABNORMAL HIGH (ref 98–111)
Creatinine, Ser: 0.82 mg/dL (ref 0.61–1.24)
GFR, Estimated: >60 mL/min (ref >60)
Glucose, Bld: 78 mg/dL (ref 70–99)
Potassium: 3.2 mmol/L — ABNORMAL LOW (ref 3.5–5.1)
Sodium: 144 mmol/L (ref 135–145)
Total Bilirubin: 0.6 mg/dL (ref 0.3–1.2)
Total Protein: 5.5 g/dL — ABNORMAL LOW (ref 6.5–8.1)

## 2021-08-14 LAB — CBC
HCT: 28.4 % — ABNORMAL LOW (ref 39.0–52.0)
Hemoglobin: 9 g/dL — ABNORMAL LOW (ref 13.0–17.0)
MCH: 28.4 pg (ref 26.0–34.0)
MCHC: 31.7 g/dL (ref 30.0–36.0)
MCV: 89.6 fL (ref 80.0–100.0)
Platelets: 233 10*3/uL (ref 150–400)
RBC: 3.17 MIL/uL — ABNORMAL LOW (ref 4.22–5.81)
RDW: 19 % — ABNORMAL HIGH (ref 11.5–15.5)
WBC: 7.6 10*3/uL (ref 4.0–10.5)
nRBC: 0 % (ref 0.0–0.2)

## 2021-08-14 LAB — IRON AND TIBC
Iron: 28 ug/dL — ABNORMAL LOW (ref 45–182)
Saturation Ratios: 7 % — ABNORMAL LOW (ref 17.9–39.5)
TIBC: 385 ug/dL (ref 250–450)
UIBC: 357 ug/dL

## 2021-08-14 LAB — RETICULOCYTES
Immature Retic Fract: 28.2 % — ABNORMAL HIGH (ref 2.3–15.9)
RBC.: 3.23 MIL/uL — ABNORMAL LOW (ref 4.22–5.81)
Retic Count, Absolute: 64 10*3/uL (ref 19.0–186.0)
Retic Ct Pct: 2 % (ref 0.4–3.1)

## 2021-08-14 LAB — MAGNESIUM
Magnesium: 0.9 mg/dL — CL (ref 1.7–2.4)
Magnesium: 1.2 mg/dL — ABNORMAL LOW (ref 1.7–2.4)

## 2021-08-14 LAB — LIPASE, BLOOD: Lipase: 30 U/L (ref 11–51)

## 2021-08-14 LAB — FERRITIN: Ferritin: 15 ng/mL — ABNORMAL LOW (ref 24–336)

## 2021-08-14 LAB — POC OCCULT BLOOD, ED: Fecal Occult Bld: NEGATIVE

## 2021-08-14 MED ORDER — PANTOPRAZOLE SODIUM 40 MG PO TBEC
40.0000 mg | DELAYED_RELEASE_TABLET | Freq: Every day | ORAL | Status: DC
Start: 2021-08-14 — End: 2021-08-18
  Administered 2021-08-14 – 2021-08-18 (×5): 40 mg via ORAL
  Filled 2021-08-14 (×5): qty 1

## 2021-08-14 MED ORDER — LORAZEPAM 1 MG PO TABS
1.0000 mg | ORAL_TABLET | ORAL | Status: AC | PRN
  Administered 2021-08-14 – 2021-08-15 (×2): 2 mg via ORAL
  Filled 2021-08-14 (×2): qty 2

## 2021-08-14 MED ORDER — MAGNESIUM SULFATE 2 GM/50ML IV SOLN
2.0000 g | Freq: Once | INTRAVENOUS | Status: AC
  Administered 2021-08-14: 2 g via INTRAVENOUS
  Filled 2021-08-14: qty 50

## 2021-08-14 MED ORDER — SUCRALFATE 1 G PO TABS
1.0000 g | ORAL_TABLET | Freq: Three times a day (TID) | ORAL | Status: DC
  Administered 2021-08-14 – 2021-08-18 (×12): 1 g via ORAL
  Filled 2021-08-14 (×12): qty 1

## 2021-08-14 MED ORDER — VITAMIN D (ERGOCALCIFEROL) 1.25 MG (50000 UNIT) PO CAPS
50000.0000 [IU] | ORAL_CAPSULE | ORAL | Status: DC
  Administered 2021-08-18: 50000 [IU] via ORAL
  Filled 2021-08-14: qty 1

## 2021-08-14 MED ORDER — HYDROMORPHONE HCL 1 MG/ML IJ SOLN
1.0000 mg | Freq: Once | INTRAMUSCULAR | Status: AC
  Administered 2021-08-14: 1 mg via INTRAVENOUS
  Filled 2021-08-14: qty 1

## 2021-08-14 MED ORDER — FLUTICASONE FUROATE-VILANTEROL 200-25 MCG/ACT IN AEPB
1.0000 | INHALATION_SPRAY | Freq: Every day | RESPIRATORY_TRACT | Status: DC
  Administered 2021-08-16 – 2021-08-18 (×3): 1 via RESPIRATORY_TRACT
  Filled 2021-08-14: qty 28

## 2021-08-14 MED ORDER — ONDANSETRON HCL 4 MG/2ML IJ SOLN
4.0000 mg | Freq: Four times a day (QID) | INTRAMUSCULAR | Status: DC | PRN
  Administered 2021-08-16: 4 mg via INTRAVENOUS
  Filled 2021-08-14 (×2): qty 2

## 2021-08-14 MED ORDER — METOPROLOL TARTRATE 50 MG PO TABS
50.0000 mg | ORAL_TABLET | Freq: Two times a day (BID) | ORAL | Status: DC
  Administered 2021-08-14 – 2021-08-15 (×2): 50 mg via ORAL
  Filled 2021-08-14 (×2): qty 1

## 2021-08-14 MED ORDER — ONDANSETRON HCL 4 MG/2ML IJ SOLN
4.0000 mg | Freq: Once | INTRAMUSCULAR | Status: AC
  Administered 2021-08-14: 4 mg via INTRAVENOUS
  Filled 2021-08-14: qty 2

## 2021-08-14 MED ORDER — HYDRALAZINE HCL 25 MG PO TABS
25.0000 mg | ORAL_TABLET | Freq: Four times a day (QID) | ORAL | Status: DC | PRN
  Administered 2021-08-17 – 2021-08-18 (×2): 25 mg via ORAL
  Filled 2021-08-14 (×2): qty 1

## 2021-08-14 MED ORDER — FOLIC ACID 1 MG PO TABS
1.0000 mg | ORAL_TABLET | Freq: Every day | ORAL | Status: DC
  Administered 2021-08-14 – 2021-08-18 (×5): 1 mg via ORAL
  Filled 2021-08-14 (×5): qty 1

## 2021-08-14 MED ORDER — THIAMINE HCL 100 MG PO TABS: 100.0000 mg | ORAL_TABLET | Freq: Every day | ORAL | Status: DC

## 2021-08-14 MED ORDER — THIAMINE HCL 100 MG/ML IJ SOLN: 100.0000 mg | Freq: Every day | INTRAMUSCULAR | Status: DC

## 2021-08-14 MED ORDER — MIRTAZAPINE 15 MG PO TABS
15.0000 mg | ORAL_TABLET | Freq: Every day | ORAL | Status: DC
Start: 2021-08-14 — End: 2021-08-18
  Administered 2021-08-14 – 2021-08-17 (×4): 15 mg via ORAL
  Filled 2021-08-14 (×5): qty 1

## 2021-08-14 MED ORDER — POTASSIUM CHLORIDE 10 MEQ/100ML IV SOLN
10.0000 meq | Freq: Once | INTRAVENOUS | Status: AC
  Administered 2021-08-14: 10 meq via INTRAVENOUS
  Filled 2021-08-14: qty 100

## 2021-08-14 MED ORDER — ADULT MULTIVITAMIN W/MINERALS CH
1.0000 | ORAL_TABLET | Freq: Every day | ORAL | Status: DC
  Administered 2021-08-14 – 2021-08-18 (×5): 1 via ORAL
  Filled 2021-08-14 (×5): qty 1

## 2021-08-14 MED ORDER — ALBUTEROL SULFATE HFA 108 (90 BASE) MCG/ACT IN AERS: 2.0000 | INHALATION_SPRAY | RESPIRATORY_TRACT | Status: DC | PRN

## 2021-08-14 MED ORDER — PANCRELIPASE (LIP-PROT-AMYL) 36000-114000 UNITS PO CPEP
72000.0000 [IU] | ORAL_CAPSULE | Freq: Three times a day (TID) | ORAL | Status: DC
  Administered 2021-08-15 – 2021-08-18 (×8): 72000 [IU] via ORAL
  Filled 2021-08-14 (×9): qty 2

## 2021-08-14 MED ORDER — METHOCARBAMOL 500 MG PO TABS
750.0000 mg | ORAL_TABLET | Freq: Four times a day (QID) | ORAL | Status: DC | PRN
  Administered 2021-08-17 (×2): 750 mg via ORAL
  Filled 2021-08-14 (×3): qty 2

## 2021-08-14 MED ORDER — SERTRALINE HCL 100 MG PO TABS: 100.0000 mg | ORAL_TABLET | Freq: Every day | ORAL | Status: DC

## 2021-08-14 MED ORDER — MAGNESIUM OXIDE -MG SUPPLEMENT 400 (240 MG) MG PO TABS
400.0000 mg | ORAL_TABLET | Freq: Every day | ORAL | Status: DC
  Administered 2021-08-14: 400 mg via ORAL
  Filled 2021-08-14: qty 1

## 2021-08-14 MED ORDER — OXYCODONE-ACETAMINOPHEN 5-325 MG PO TABS
1.0000 | ORAL_TABLET | Freq: Four times a day (QID) | ORAL | Status: DC | PRN
  Administered 2021-08-14 – 2021-08-17 (×10): 1 via ORAL
  Filled 2021-08-14 (×10): qty 1

## 2021-08-14 MED ORDER — LOPERAMIDE HCL 2 MG PO CAPS: 2.0000 mg | ORAL_CAPSULE | ORAL | Status: DC | PRN

## 2021-08-14 MED ORDER — ALBUTEROL SULFATE (2.5 MG/3ML) 0.083% IN NEBU
2.5000 mg | INHALATION_SOLUTION | RESPIRATORY_TRACT | Status: DC | PRN
  Administered 2021-08-15 – 2021-08-18 (×3): 2.5 mg via RESPIRATORY_TRACT
  Filled 2021-08-14 (×4): qty 3

## 2021-08-14 MED ORDER — LORAZEPAM 2 MG/ML IJ SOLN: 1.0000 mg | INTRAMUSCULAR | Status: AC | PRN

## 2021-08-14 MED ORDER — GABAPENTIN 100 MG PO CAPS
100.0000 mg | ORAL_CAPSULE | Freq: Three times a day (TID) | ORAL | Status: DC
Start: 2021-08-14 — End: 2021-08-18
  Administered 2021-08-14 – 2021-08-18 (×11): 100 mg via ORAL
  Filled 2021-08-14 (×11): qty 1

## 2021-08-14 MED ORDER — POTASSIUM CHLORIDE CRYS ER 20 MEQ PO TBCR
40.0000 meq | EXTENDED_RELEASE_TABLET | Freq: Once | ORAL | Status: AC
  Administered 2021-08-14: 40 meq via ORAL
  Filled 2021-08-14: qty 2

## 2021-08-14 MED ORDER — HYDROMORPHONE HCL 1 MG/ML IJ SOLN
0.5000 mg | Freq: Once | INTRAMUSCULAR | Status: AC
  Administered 2021-08-14: 0.5 mg via INTRAVENOUS
  Filled 2021-08-14: qty 1

## 2021-08-14 MED ORDER — THIAMINE HCL 100 MG PO TABS
100.0000 mg | ORAL_TABLET | Freq: Every day | ORAL | Status: DC
  Administered 2021-08-14 – 2021-08-18 (×5): 100 mg via ORAL
  Filled 2021-08-14 (×5): qty 1

## 2021-08-14 MED ORDER — ENOXAPARIN SODIUM 40 MG/0.4ML IJ SOSY
40.0000 mg | PREFILLED_SYRINGE | INTRAMUSCULAR | Status: DC
  Administered 2021-08-14 – 2021-08-17 (×4): 40 mg via SUBCUTANEOUS
  Filled 2021-08-14 (×4): qty 0.4

## 2021-08-14 MED ORDER — ACETAMINOPHEN 500 MG PO TABS
500.0000 mg | ORAL_TABLET | Freq: Four times a day (QID) | ORAL | Status: DC | PRN
  Administered 2021-08-15 – 2021-08-18 (×3): 500 mg via ORAL
  Filled 2021-08-14 (×3): qty 1

## 2021-08-14 NOTE — ED Notes (Signed)
RN witnessed rectal exam  ?

## 2021-08-14 NOTE — ED Provider Notes (Signed)
?[facility] ?Provider Note ? ? ?CSN: [id number] ?Arrival date & time: 08/14/21  1337 ? ?  ? ?History ? ?Chief Complaint  ?Patient presents with  ? Abdominal Pain  ? ? ?[name] is a 51 y.o. male who presents emergency department with abdominal pain.  Patient was recently discharged 2 days ago from [facility] for acute on chronic pancreatitis due to alcohol abuse.  He states that after being discharged she has had increasing abdominal pain, especially in his upper quadrants.  He is also had associated nausea and vomiting, most recently vomiting this morning.  Numerous episodes of diarrhea that he describes as dark maroon-brown, concerning for blood.  Denies any alcohol use after discharge.  States that the pain is radiating into his chest.  He is also noticed more swelling of his face, abdomen, and bilateral legs that is never happened before.  He is not on any fluid pills. ? ? ?Abdominal Pain ?Associated symptoms: diarrhea, nausea and vomiting   ?Associated symptoms: no fever and no shortness of breath   ? ?  ? ?Home Medications ?Prior to Admission medications   ?Medication Sig Start Date End Date Taking? Authorizing Provider  ?acetaminophen (TYLENOL) 500 MG tablet Take 500 mg by mouth every 6 (six) hours as needed for moderate pain or headache.    Provider, Historical, Moran  ?albuterol (VENTOLIN HFA) 108 (90 Base) MCG/ACT inhaler Inhale 2 puffs into the lungs every 4 (four) hours as needed for wheezing or shortness of breath. 11/12/19   [name], Moran  ?amoxicillin-clavulanate (AUGMENTIN) 875-125 MG tablet Take 1 tablet by mouth every 12 (twelve) hours. 06/23/21   [name]  ?cholecalciferol (VITAMIN D) 25 MCG tablet Take 1 tablet (1,000 Units total) by mouth daily. 10/30/20   [name]  ?fluticasone furoate-vilanterol (BREO ELLIPTA) 200-25 MCG/INH AEPB Inhale 1 puff into the lungs daily. ?Patient taking differently: Inhale 1  puff into the lungs daily as needed (asthma). 11/12/19   [name], Moran  ?folic acid (FOLVITE) 1 MG tablet Take 1 tablet (1 mg total) by mouth daily. ?Patient not taking: Reported on 05/26/2021 11/26/17   [name]  ?gabapentin (NEURONTIN) 100 MG capsule Take 100 mg by mouth 3 (three) times daily. 05/17/21   Provider, Historical, Moran  ?lidocaine (XYLOCAINE) 2 % solution Use as directed 15 mLs in the mouth or throat every 6 (six) hours as needed for mouth pain. 11/21/20   [name]  ?lipase/protease/amylase (CREON) 36000 UNITS CPEP capsule Take 2 capsules (72,000 Units total) by mouth 3 (three) times daily with meals. For pancreatitis 11/05/19   [name]  ?magnesium oxide (MAG-OX) 400 (240 Mg) MG tablet Take 400 mg by mouth daily. 12/24/20   Provider, Historical, Moran  ?methocarbamol (ROBAXIN) 750 MG tablet Take 750 mg by mouth every 6 (six) hours as needed for muscle spasms. 05/17/21   Provider, Historical, Moran  ?metoprolol (TOPROL-XL) 200 MG 24 hr tablet Take 200 mg by mouth daily. 10/29/20   Provider, Historical, Moran  ?mirtazapine (REMERON) 15 MG tablet Take 15 mg by mouth at bedtime. 05/17/21   Provider, Historical, Moran  ?Multiple Vitamin (MULTIVITAMIN WITH MINERALS) TABS tablet Take 1 tablet by mouth daily. 09/06/17   Bonnell Public, Moran  ?omeprazole (PRILOSEC) 40 MG capsule Take 1 capsule (40 mg total) by mouth 2 (two) times daily as needed (acid reflex). 07/13/21   [name]  ?  ondansetron (ZOFRAN-ODT) 4 MG disintegrating tablet Take 1 tablet (4 mg total) by mouth every 8 (eight) hours as needed for vomiting or nausea. 05/24/21   Quintella Reichert, Moran  ?OVER THE COUNTER MEDICATION Take 1 tablet by mouth daily. Iron supplement.    Provider, Historical, Moran  ?oxyCODONE-acetaminophen (PERCOCET/ROXICET) 5-325 MG tablet Take 1 tablet by mouth every 6 (six) hours as needed for moderate pain. 05/17/21   Provider, Historical, Moran  ?Polyethyl Glycol-Propyl Glycol (SYSTANE OP) Place 1 drop  into both eyes daily as needed (dry eyes).    Provider, Historical, Moran  ?sertraline (ZOLOFT) 100 MG tablet Take 1 tablet (100 mg total) by mouth daily. 03/27/20   [name]  ?sucralfate (CARAFATE) 1 g tablet Take 1 tablet (1 g total) by mouth 4 (four) times daily -  with meals and at bedtime. 04/11/21   Rancour, Annie Main, Moran  ?sucralfate (CARAFATE) 1 GM/10ML suspension Take 10 MLs BY MOUTH FOUR TIMES DAILY with meals AND AT BEDTIME ?Patient taking differently: Take 1 g by mouth in the morning, at noon, in the evening, and at bedtime. 06/01/21   [name]  ?thiamine 100 MG tablet Take 1 tablet (100 mg total) by mouth daily. 10/16/18   [name]  ?Vitamin D, Ergocalciferol, (DRISDOL) 1.25 MG (50000 UNIT) CAPS capsule Take 50,000 Units by mouth every Wednesday.    Provider, Historical, Moran  ?amitriptyline (ELAVIL) 25 MG tablet Take 1 tablet (25 mg total) by mouth at bedtime. ?Patient not taking: Reported on 08/08/2019 10/15/18 08/08/19  [name]  ?promethazine (PHENERGAN) 25 MG tablet Take 1 tablet (25 mg total) by mouth every 6 (six) hours as needed for nausea or vomiting. ?Patient not taking: Reported on 08/08/2019 03/02/19 08/08/19  Kinnie Feil, PA-C  ?   ? ?Allergies    ?Robaxin [methocarbamol], Aspirin, Shellfish-derived products, Trazodone and nefazodone, Adhesive [tape], Latex, Toradol [ketorolac tromethamine], Contrast media [iodinated contrast media], and Reglan [metoclopramide]   ? ?Review of Systems   ?Review of Systems  ?Constitutional:  Negative for fever.  ?Respiratory:  Negative for shortness of breath.   ?Cardiovascular:  Positive for leg swelling.  ?Gastrointestinal:  Positive for abdominal distention, abdominal pain, blood in stool, diarrhea, nausea and vomiting.  ?All other systems reviewed and are negative. ? ?Physical Exam ?Updated Vital Signs ?BP (!) 177/102   Pulse 91   Temp 98.2 ?F (36.8 ?C) (Oral)   Resp 18   Ht '5\' 8"'$  (1.727 m)   Wt 62 kg   SpO2 100%    BMI 20.78 kg/m?  ?Physical Exam ?Vitals and nursing note reviewed.  ?Constitutional:   ?   Appearance: Normal appearance.  ?HENT:  ?   Head: Normocephalic and atraumatic.  ?Eyes:  ?   Conjunctiva/sclera: Conjunctivae normal.  ?Cardiovascular:  ?   Rate and Rhythm: Normal rate and regular rhythm.  ?   Pulses:     ?     Dorsalis pedis pulses are 2+ on the right side and 2+ on the left side.  ?   Comments: Bilateral pitting edema to the level of the knees ?Pulmonary:  ?   Effort: Pulmonary effort is normal. No respiratory distress.  ?   Breath sounds: Normal breath sounds.  ?Abdominal:  ?   General: There is distension.  ?   Palpations: Abdomen is soft.  ?   Tenderness: There is generalized abdominal tenderness. There is guarding.  ?Musculoskeletal:  ?   Right lower leg: 2+ Pitting  Edema present.  ?   Left lower leg: 2+ Pitting Edema present.  ?Skin: ?   General: Skin is warm and dry.  ?Neurological:  ?   General: No focal deficit present.  ?   Mental Status: He is alert.  ? ? ?ED Results / Procedures / Treatments   ?Labs ?(all labs ordered are listed, but only abnormal results are displayed) ?Labs Reviewed  ?CBC - Abnormal; Notable for the following components:  ?    Result Value  ? RBC 3.17 (*)   ? Hemoglobin 9.0 (*)   ? HCT 28.4 (*)   ? RDW 19.0 (*)   ? All other components within normal limits  ?URINALYSIS, ROUTINE W REFLEX MICROSCOPIC  ?LIPASE, BLOOD  ?COMPREHENSIVE METABOLIC PANEL  ?MAGNESIUM  ? ? ?EKG ?EKG Interpretation ? ?Date/Time:  Saturday August 14 2021 13:52:08 EDT ?Ventricular Rate:  76 ?PR Interval:  103 ?QRS Duration: 72 ?QT Interval:  310 ?QTC Calculation: 349 ?R Axis:   65 ?Text Interpretation: Sinus rhythm Multiform ventricular premature complexes Short PR interval Probable left atrial enlargement Nonspecific T abnormalities, diffuse leads Since prior ECG, rate has slowed Confirmed by Gareth Morgan [phone]) on 08/14/2021 3:11:24 PM ? ?Radiology ?No results found. ? ?Procedures ?Procedures   ? ? ?Medications Ordered in ED ?Medications  ?HYDROmorphone (DILAUDID) injection 1 mg (1 mg Intravenous Given 08/14/21 1441)  ?ondansetron (ZOFRAN) injection 4 mg (4 mg Intravenous Given 08/14/21 1441)  ? ? ?ED Course/ M

## 2021-08-14 NOTE — H&P (Signed)
?History and Physical  ? ? ?[name] DJM:[id number] DOB: March 12, 1971 DOA: 08/14/2021 ? ?PCP: Pcp, No (Confirm with patient/family/NH records and if not entered, this has to be entered at [facility] point of entry) ?Patient coming from: Home ? ?I have personally briefly reviewed patient's old medical records in [place] ? ?Chief Complaint: Nauseous vomiting, abdominal pain, diarrhea. ? ?HPI: [name] is a 51 y.o. male with medical history significant of chronic alcoholic pancreatitis, HTN, chronic anemia, asthma, bipolar disorder, alcohol abuse, presented with recurrent abdominal pain, frequent nauseous vomiting and diarrhea. ? ?Patient was recently hospitalized at [facility] for recurrent alcoholic hepatitis.  Patient was hospitalized 7 days and then released 2 days ago.  He was feeling better after discharge for 1 day, then starting yesterday, patient started to have frequent nauseous vomiting of stomach content nonbilious nonbloody, and cramping-like RUQ pain.-He reported for the last 3+ weeks has been having persistent diarrhea 5-6 times a day watery, no tenderness, no fever or chills.  He denied recent use of alcohol. ? ?ED Course: Blood pressure significant elevated, no hypoxia no fever. ? ?CT abdomen pelvis showed no acute findings but chronic pancreatitis and moderate right and small left pleural effusions. ? ?Within normal limits.  Magnesium 0.7, K3.2. ? ?Review of Systems: As per HPI otherwise 14 point review of systems negative.  ? ? ?Past Medical History:  ?Diagnosis Date  ? Alcoholism /alcohol abuse   ? Anemia   ? Anxiety   ? Arthritis   ? "knees; arms; elbows" (03/26/2015)  ? Asthma   ? Bipolar disorder ([place])   ? Chronic bronchitis ([place])   ? Chronic lower back pain   ? Chronic pancreatitis ([place])   ? Cocaine abuse ([place])   ? Depression   ? Family history of adverse reaction to anesthesia   ? "grandmother gets confused"  ? Femoral condyle fracture ([place]) 03/08/2014  ? left  medial/notes 03/09/2014  ? GERD (gastroesophageal reflux disease)   ? H/O hiatal hernia   ? H/O suicide attempt 10/2012  ? High cholesterol   ? History of blood transfusion 10/2012  ? "when I tried to commit suicide"  ? History of stomach ulcers   ? Hypertension   ? Marijuana abuse, continuous   ? Migraine   ? "a few times/year" (03/26/2015)  ? Pneumonia 1990's X 3  ? PTSD (post-traumatic stress disorder)   ? Sickle cell trait ([place])   ? WPW (Wolff-Parkinson-White syndrome)   ? [name] 03/06/2013  ? ? ?Past Surgical History:  ?Procedure Laterality Date  ? BIOPSY  11/25/2017  ? Procedure: BIOPSY;  Surgeon: Arta Silence, MD;  Location: [place];  Service: Endoscopy;;  ? BIOPSY  10/14/2018  ? Procedure: BIOPSY;  Surgeon: Arta Silence, MD;  Location: [place];  Service: Endoscopy;;  ? CARDIAC CATHETERIZATION    ? CYST ENTEROSTOMY  01/02/2020  ? Procedure: CYST ASPIRATION;  Surgeon: Milus Banister, MD;  Location: [facility];  Service: Endoscopy;;  ? ESOPHAGOGASTRODUODENOSCOPY (EGD) WITH PROPOFOL N/A 11/25/2017  ? Procedure: ESOPHAGOGASTRODUODENOSCOPY (EGD) WITH PROPOFOL;  Surgeon: Arta Silence, MD;  Location: [facility] ENDOSCOPY;  Service: Endoscopy;  Laterality: N/A;  ? ESOPHAGOGASTRODUODENOSCOPY (EGD) WITH PROPOFOL Left 10/14/2018  ? Procedure: ESOPHAGOGASTRODUODENOSCOPY (EGD) WITH PROPOFOL;  Surgeon: Arta Silence, MD;  Location: [facility]COPY;  Service: Endoscopy;  Laterality: Left;  ? ESOPHAGOGASTRODUODENOSCOPY (EGD) WITH PROPOFOL N/A 11/14/2018  ? Procedure: ESOPHAGOGASTRODUODENOSCOPY (EGD) WITH PROPOFOL;  Surgeon: Laurence Spates, MD;  Location: [facility] ENDOSCOPY;  Service: Gastroenterology;  Laterality:  N/A;  ? ESOPHAGOGASTRODUODENOSCOPY (EGD) WITH PROPOFOL N/A 01/02/2020  ? Procedure: ESOPHAGOGASTRODUODENOSCOPY (EGD) WITH PROPOFOL;  Surgeon: Milus Banister, MD;  Location: [facility] ENDOSCOPY;  Service: Endoscopy;  Laterality: N/A;  ? ESOPHAGOGASTRODUODENOSCOPY (EGD) WITH PROPOFOL N/A 10/25/2020  ? Procedure:  ESOPHAGOGASTRODUODENOSCOPY (EGD) WITH PROPOFOL;  Surgeon: Rush Landmark Telford Nab., MD;  Location: [place];  Service: Gastroenterology;  Laterality: N/A;  ? EUS N/A 01/02/2020  ? Procedure: UPPER ENDOSCOPIC ULTRASOUND (EUS) RADIAL;  Surgeon: Milus Banister, MD;  Location: [facility] ENDOSCOPY;  Service: Endoscopy;  Laterality: N/A;  ? EYE SURGERY Left 1990's  ? "result of trauma"   ? FACIAL FRACTURE SURGERY Left 1990's  ? "result of trauma"   ? FLEXIBLE SIGMOIDOSCOPY N/A 10/25/2020  ? Procedure: FLEXIBLE SIGMOIDOSCOPY;  Surgeon: Irving Copas., MD;  Location: [place];  Service: Gastroenterology;  Laterality: N/A;  ? FRACTURE SURGERY    ? HEMOSTASIS CLIP PLACEMENT  10/25/2020  ? Procedure: HEMOSTASIS CLIP PLACEMENT;  Surgeon: Irving Copas., MD;  Location: [place];  Service: Gastroenterology;;  ? HERNIA REPAIR    ? LEFT HEART CATHETERIZATION WITH CORONARY ANGIOGRAM Right 03/07/2013  ? Procedure: LEFT HEART CATHETERIZATION WITH CORONARY ANGIOGRAM;  Surgeon: [name], MD;  Location: [facility] CATH LAB;  Service: Cardiovascular;  Laterality: Right;  ? UMBILICAL HERNIA REPAIR    ? UPPER GASTROINTESTINAL ENDOSCOPY    ? ? ? reports that he has been smoking cigarettes and e-cigarettes. He has a 36.00 pack-year smoking history. He has never used smokeless tobacco. He reports current alcohol use. He reports current drug use. Drugs: Marijuana and Cocaine. ? ?Allergies  ?Allergen Reactions  ? Robaxin [Methocarbamol] Other (See Comments)  ?  "jumpy limbs"  ? Aspirin   ?  Other reaction(s): Other (See Comments)  ? Shellfish-Derived Products Nausea And Vomiting  ? Trazodone And Nefazodone Other (See Comments)  ?  Muscle spasms  ? Adhesive [Tape] Itching  ? Latex Itching  ? Toradol [Ketorolac Tromethamine] Other (See Comments)  ?  Has ulcers; cannot have this  ? Contrast Media [Iodinated Contrast Media] Hives  ? Reglan [Metoclopramide] Other (See Comments)  ?  Muscle spasms  ? ? ?Family History  ?Problem  Relation Age of Onset  ? Hypertension Mother   ? Cirrhosis Father   ? Alcoholism Father   ? Hypertension Father   ? Melanoma Father   ? Hypertension Other   ? Coronary artery disease Other   ? ? ? ?Prior to Admission medications   ?Medication Sig Start Date End Date Taking? Authorizing Provider  ?acetaminophen (TYLENOL) 500 MG tablet Take 500 mg by mouth every 6 (six) hours as needed for moderate pain or headache.    [provider]  ?albuterol (VENTOLIN HFA) 108 (90 Base) MCG/ACT inhaler Inhale 2 puffs into the lungs every 4 (four) hours as needed for wheezing or shortness of breath. 11/12/19   [name], DO  ?amoxicillin-clavulanate (AUGMENTIN) 875-125 MG tablet Take 1 tablet by mouth every 12 (twelve) hours. 06/23/21   [name], [name], MD  ?cholecalciferol (VITAMIN D) 25 MCG tablet Take 1 tablet (1,000 Units total) by mouth daily. 10/30/20   [name], MD  ?fluticasone furoate-vilanterol (BREO ELLIPTA) 200-25 MCG/INH AEPB Inhale 1 puff into the lungs daily. ?Patient taking differently: Inhale 1 puff into the lungs daily as needed (asthma). 11/12/19   [name], DO  ?folic acid (FOLVITE) 1 MG tablet Take 1 tablet (1 mg total) by mouth daily. ?Patient not taking: Reported on 05/26/2021 11/26/17  [name], MD  ?gabapentin (NEURONTIN) 100 MG capsule Take 100 mg by mouth 3 (three) times daily. 05/17/21   [provider]  ?lidocaine (XYLOCAINE) 2 % solution Use as directed 15 mLs in the mouth or throat every 6 (six) hours as needed for mouth pain. 11/21/20   [name], [name], DO  ?lipase/protease/amylase (CREON) 36000 UNITS CPEP capsule Take 2 capsules (72,000 Units total) by mouth 3 (three) times daily with meals. For pancreatitis 11/05/19   [name], [name], MD  ?magnesium oxide (MAG-OX) 400 (240 Mg) MG tablet Take 400 mg by mouth daily. 12/24/20   [provider]  ?methocarbamol (ROBAXIN) 750 MG tablet Take 750 mg by mouth every 6 (six) hours as needed for muscle spasms.  05/17/21   [provider]  ?metoprolol (TOPROL-XL) 200 MG 24 hr tablet Take 200 mg by mouth daily. 10/29/20   [provider]  ?mirtazapine (REMERON) 15 MG tablet Take 15 mg by mouth at bedtime. 05/17/21   Prov

## 2021-08-14 NOTE — ED Notes (Signed)
Pt requesting pain meds.  PA notified. 

## 2021-08-14 NOTE — ED Triage Notes (Signed)
Via EMS from home. Pt recently dc'd [facility] for pancreatitis. Pt reports since dc has had increasing RUQ/LUQ abd pain with NV. Pt denies etoh use. Pt states pain is radiating into chest.  ?

## 2021-08-14 NOTE — ED Notes (Signed)
Patient transported to CT 

## 2021-08-14 NOTE — ED Provider Notes (Signed)
?  Physical Exam  ?BP (!) 166/107   Pulse 87   Temp 98.2 ?F (36.8 ?C) (Oral)   Resp 20   Ht '5\' 8"'$  (1.727 m)   Wt 62 kg   SpO2 100%   BMI 20.78 kg/m?  ? ?Physical Exam ?Vitals and nursing note reviewed.  ?Constitutional:   ?   General: He is not in acute distress. ?   Appearance: He is well-developed. He is not toxic-appearing.  ?HENT:  ?   Head: Normocephalic and atraumatic.  ?   Mouth/Throat:  ?   Mouth: Mucous membranes are moist.  ?   Pharynx: Oropharynx is clear.  ?Eyes:  ?   Extraocular Movements: Extraocular movements intact.  ?   Conjunctiva/sclera: Conjunctivae normal.  ?Cardiovascular:  ?   Rate and Rhythm: Normal rate and regular rhythm.  ?   Heart sounds: No murmur heard. ?Pulmonary:  ?   Effort: Pulmonary effort is normal. No respiratory distress.  ?   Breath sounds: Normal breath sounds.  ?Abdominal:  ?   General: Abdomen is flat. Bowel sounds are normal.  ?   Palpations: Abdomen is rigid.  ?   Tenderness: There is abdominal tenderness in the right upper quadrant, epigastric area and left upper quadrant.  ?Musculoskeletal:     ?   General: No swelling.  ?   Cervical back: Neck supple.  ?   Right lower leg: 2+ Pitting Edema present.  ?   Left lower leg: 2+ Pitting Edema present.  ?Skin: ?   General: Skin is warm and dry.  ?   Capillary Refill: Capillary refill takes less than 2 seconds.  ?   Coloration: Skin is not jaundiced or pale.  ?Neurological:  ?   Mental Status: He is alert and oriented to person, place, and time.  ?Psychiatric:     ?   Mood and Affect: Mood normal.     ?   Behavior: Behavior normal.  ? ? ?Procedures  ?Procedures ? ?ED Course / MDM  ? ?Clinical Course as of 08/14/21 1718  ?Sat Aug 14, 2021  ?1601 EKG 12-Lead [CG]  ?  ?Clinical Course User Index ?[CG] Azucena Cecil, PA-C  ? ?Medical Decision Making ?Amount and/or Complexity of Data Reviewed ?Labs: ordered. ?Radiology: ordered. ?ECG/medicine tests:  Decision-making details documented in ED Course. ? ?Risk ?Prescription  drug management. ? ? ?Patient signed out to me at shift change by previous provider.  Please see previous provider note for further detail.  In short, this is a 51 year old male with chronic pancreatitis due to alcohol abuse.  Patient was discharged from [facility] 2 days ago and presents to ED today for evaluation of increased abdominal pain located in his upper quadrants along with nausea, vomiting, diarrhea and concern for GI bleed.  Patient also has bilateral 2+ pitting edema, no history of CHF. ? ?Patient work-up revealed decreased potassium to 3.2, decreased magnesium to 0.9, decreased albumin to 2.7.  Patient also shown to have bilateral pleural effusions on chest x-ray.  Concerning for third spacing. ? ?Patient has been discussed with hospitalist Dr. Roosevelt Locks who has agreed admit the patient for electrolyte derangement.  The patient is agreeable to the plan.  Patient stable at time of admission ? ? ?  ?Azucena Cecil, PA-C ?08/14/21 1718 ? ?  ?Lennice Sites, DO ?08/14/21 2338 ? ?

## 2021-08-14 NOTE — Plan of Care (Signed)

## 2021-08-14 NOTE — ED Notes (Signed)
Patient transported to X-ray 

## 2021-08-15 LAB — CBC
HCT: 28.4 % — ABNORMAL LOW (ref 39.0–52.0)
Hemoglobin: 9 g/dL — ABNORMAL LOW (ref 13.0–17.0)
MCH: 29 pg (ref 26.0–34.0)
MCHC: 31.7 g/dL (ref 30.0–36.0)
MCV: 91.6 fL (ref 80.0–100.0)
Platelets: 221 10*3/uL (ref 150–400)
RBC: 3.1 MIL/uL — ABNORMAL LOW (ref 4.22–5.81)
RDW: 19.3 % — ABNORMAL HIGH (ref 11.5–15.5)
WBC: 7 10*3/uL (ref 4.0–10.5)
nRBC: 0 % (ref 0.0–0.2)

## 2021-08-15 LAB — MAGNESIUM: Magnesium: 1.5 mg/dL — ABNORMAL LOW (ref 1.7–2.4)

## 2021-08-15 MED ORDER — SPIRONOLACTONE 25 MG PO TABS
25.0000 mg | ORAL_TABLET | Freq: Every day | ORAL | Status: DC
  Administered 2021-08-15 – 2021-08-17 (×3): 25 mg via ORAL
  Filled 2021-08-15 (×3): qty 1

## 2021-08-15 MED ORDER — SERTRALINE HCL 50 MG PO TABS
25.0000 mg | ORAL_TABLET | Freq: Every day | ORAL | Status: DC
  Administered 2021-08-15 – 2021-08-18 (×4): 25 mg via ORAL
  Filled 2021-08-15 (×4): qty 1

## 2021-08-15 MED ORDER — FUROSEMIDE 40 MG PO TABS
40.0000 mg | ORAL_TABLET | Freq: Once | ORAL | Status: AC
Start: 2021-08-15 — End: 2021-08-15
  Administered 2021-08-15: 40 mg via ORAL
  Filled 2021-08-15: qty 1

## 2021-08-15 MED ORDER — MAGNESIUM SULFATE 4 GM/100ML IV SOLN
4.0000 g | Freq: Once | INTRAVENOUS | Status: AC
  Administered 2021-08-15: 4 g via INTRAVENOUS
  Filled 2021-08-15: qty 100

## 2021-08-15 MED ORDER — METOPROLOL TARTRATE 100 MG PO TABS
100.0000 mg | ORAL_TABLET | Freq: Two times a day (BID) | ORAL | Status: DC
  Administered 2021-08-15 – 2021-08-18 (×6): 100 mg via ORAL
  Filled 2021-08-15 (×6): qty 1

## 2021-08-15 MED ORDER — PROSOURCE PLUS PO LIQD
30.0000 mL | Freq: Two times a day (BID) | ORAL | Status: DC
  Administered 2021-08-15 – 2021-08-18 (×6): 30 mL via ORAL
  Filled 2021-08-15 (×6): qty 30

## 2021-08-15 MED ORDER — MORPHINE SULFATE (PF) 2 MG/ML IV SOLN
1.0000 mg | Freq: Four times a day (QID) | INTRAVENOUS | Status: DC | PRN
  Administered 2021-08-15 – 2021-08-17 (×7): 1 mg via INTRAVENOUS
  Filled 2021-08-15 (×7): qty 1

## 2021-08-15 NOTE — Progress Notes (Addendum)
?                                  PROGRESS NOTE                                             ?                                                                                                                     ?                                         ? ? Patient Demographics:  ? ? [name], is a 51 y.o. male, DOB - March 16, 1971, [id number] ? ?Outpatient Primary MD for the patient is Pcp, No    LOS - 0  Admit date - 08/14/2021   ? ?Chief Complaint  ?Patient presents with  ? Abdominal Pain  ?    ? ?Brief Narrative (HPI from H&P)  - 51 y.o. male with medical history significant of chronic alcoholic pancreatitis, HTN, chronic anemia, asthma, bipolar disorder, alcohol abuse, presented with recurrent abdominal pain, frequent nauseous vomiting and diarrhea. Patient was recently hospitalized at [facility] for recurrent alcoholic hepatitis.  Patient was hospitalized 7 days and then released 2 days ago.  He was feeling better after discharge for 1 day, then starting yesterday, patient started to have frequent nauseous vomiting of stomach content nonbilious nonbloody, and cramping-like RUQ pain. ? ? Subjective:  ? ? [name] today has, No headache, No chest pain, looks in no distress but says has ongoing Abd pain, No Nausea, No new weakness tingling or numbness, no SOB. ? ? Assessment  & Plan :  ? ?Diarrhea with N&V - in a patient with H/O Alcohol abuse and Chronic Pancreatitis - likely due to Chronic Pancreatitis, CT non acute, non toxic appearance, Creon, Soft diet and monitor with supportive care, follows with [facility]. ? ?2.  Hypokalemia & hypomagnesemia.  Due to GI losses.  Both replaced ? ?3.  Mild anasarca.  Due to third spacing of fluid caused by poor oral intake, hypoalbuminemia and IV fluids which she received previous hospitalization due to pancreatitis.  Oral protein supplementation and gentle diuresis with TED stockings. ? ?4.  History of alcohol, tobacco and  nicotine abuse.  Says last alcohol intake was more than a week ago, counseled to quit, also counseled to stop smoking and chewing tobacco.  CIWA protocol. ? ?5.  Anemia of chronic disease.  Monitor. ? ?6.  Essential hypertension.  On beta-blocker along with as needed hydralazine. ? ?   ? ?Condition - Fair ? ?Family Communication  :  none present ? ?Code Status :  Full ? ?  Consults  :  None ? ?PUD Prophylaxis : PPI ? ? Procedures  :    ? ?CT -  1. Interval development of moderate right and small left pleural effusions with bibasilar atelectasis. 2. Sequelae of chronic pancreatitis. 3. Small lesions in the pancreas mm hypoattenuating are unchanged and may reflect chronic pseudocysts. 4. Aortic Atherosclerosis (ICD10-I70.0). ? ?   ? ?Disposition Plan  :   ? ?Status is: Observation ? ?DVT Prophylaxis  :   ? ?Place TED hose Start: 08/15/21 1113 ?enoxaparin (LOVENOX) injection 40 mg Start: 08/14/21 1745 ?   ? ?Lab Results  ?Component Value Date  ? PLT 221 08/15/2021  ? ? ?Diet :  ?Diet Order   ? ?       ?  DIET SOFT Room service appropriate? Yes; Fluid consistency: Thin  Diet effective now       ?  ? ?  ?  ? ?  ?  ? ?Inpatient Medications ? ?Scheduled Meds: ? (feeding supplement) PROSource Plus  30 mL Oral BID BM  ? enoxaparin (LOVENOX) injection  40 mg Subcutaneous Q24H  ? fluticasone furoate-vilanterol  1 puff Inhalation Daily  ? folic acid  1 mg Oral Daily  ? furosemide  40 mg Oral Once  ? gabapentin  100 mg Oral TID  ? lipase/protease/amylase  72,000 Units Oral TID WC  ? metoprolol tartrate  50 mg Oral BID  ? mirtazapine  15 mg Oral QHS  ? multivitamin with minerals  1 tablet Oral Daily  ? pantoprazole  40 mg Oral Daily  ? sertraline  100 mg Oral Daily  ? sucralfate  1 g Oral TID WC & HS  ? thiamine  100 mg Oral Daily  ? Or  ? thiamine  100 mg Intravenous Daily  ? [START ON 08/18/2021] Vitamin D (Ergocalciferol)  50,000 Units Oral Q Wed  ? ?Continuous Infusions: ?PRN Meds:.acetaminophen, albuterol, hydrALAZINE,  loperamide, LORazepam **OR** LORazepam, methocarbamol, morphine injection, ondansetron (ZOFRAN) IV, oxyCODONE-acetaminophen ? ?Antibiotics  :   ? ?Anti-infectives (From admission, onward)  ? ? None  ? ?  ? ? ? Time Spent in minutes  30 ? ? ?Lala Lund M.D on 08/15/2021 at 11:13 AM ? ?To page go to [email or web address]  ? ?[facility] -  Office  [phone] ? ?See all Orders from today for further details ? ? ? Objective:  ? ?Vitals:  ? 08/15/21 0000 08/15/21 0601 08/15/21 0844 08/15/21 0859  ?BP: (!) 151/78 (!) 140/95  (!) 132/98  ?Pulse: 90 90 80 94  ?Resp: '17 18 16 18  '$ ?Temp: 98 ?F (36.7 ?C) 97.7 ?F (36.5 ?C)  98.3 ?F (36.8 ?C)  ?TempSrc: Oral Oral  Oral  ?SpO2: 100% 91% 95% 97%  ?Weight:      ?Height:      ? ? ?Wt Readings from Last 3 Encounters:  ?08/14/21 62 kg  ?08/06/21 62.1 kg  ?06/23/21 61.1 kg  ? ? ?No intake or output data in the 24 hours ending 08/15/21 1113 ? ? ?Physical Exam ? ?Awake Alert, No new F.N deficits, Normal affect ?[place].AT,PERRAL ?Supple Neck, No JVD,   ?Symmetrical Chest wall movement, Good air movement bilaterally, CTAB ?RRR,No Gallops,Rubs or new Murmurs,  ?+ve B.Sounds, Abd Soft, No tenderness,   ?No Cyanosis, trace edema   ?  ? ? Data Review:  ? ? ?CBC ?Recent Labs  ?Lab 08/14/21 ?1440 08/15/21 ?0411  ?WBC 7.6 7.0  ?HGB 9.0* 9.0*  ?HCT 28.4* 28.4*  ?PLT 233 221  ?MCV  89.6 91.6  ?MCH 28.4 29.0  ?MCHC 31.7 31.7  ?RDW 19.0* 19.3*  ? ? ?Electrolytes ?Recent Labs  ?Lab 08/14/21 ?1440 08/14/21 ?2100 08/15/21 ?0411  ?NA 144  --   --   ?K 3.2*  --   --   ?CL 113*  --   --   ?CO2 21*  --   --   ?GLUCOSE 78  --   --   ?BUN <5*  --   --   ?CREATININE 0.82  --   --   ?CALCIUM 7.8*  --   --   ?AST 29  --   --   ?ALT 17  --   --   ?ALKPHOS 68  --   --   ?BILITOT 0.6  --   --   ?ALBUMIN 2.7*  --   --   ?MG 0.9* 1.2* 1.5*  ? ? ?------------------------------------------------------------------------------------------------------------------ ?No results for input(s): CHOL, HDL, LDLCALC, TRIG, CHOLHDL,  LDLDIRECT in the last 72 hours. ? ?Lab Results  ?Component Value Date  ? HGBA1C 4.6 (L) 11/13/2018  ? ? ?No results for input(s): TSH, T4TOTAL, T3FREE, THYROIDAB in the last 72 hours. ? ?Invalid input(s): FREET3 ?------------------------------------------------------------------------------------------------------------------ ?ID Labs ?Recent Labs  ?Lab 08/14/21 ?1440 08/15/21 ?0411  ?WBC 7.6 7.0  ?PLT 233 221  ?CREATININE 0.82  --   ? ?Cardiac Enzymes ?No results for input(s): CKMB, TROPONINI, MYOGLOBIN in the last 168 hours. ? ?Invalid input(s): CK ? ? ?  ? ? ?Micro Results ?No results found for this or any previous visit (from the past 240 hour(s)). ? ?Radiology Reports ?CT ABDOMEN PELVIS WO CONTRAST ? ?Result Date: 08/14/2021 ?CLINICAL DATA:  Pancreatitis. EXAM: CT ABDOMEN AND PELVIS WITHOUT CONTRAST TECHNIQUE: Multidetector CT imaging of the abdomen and pelvis was performed following the standard protocol without IV contrast. RADIATION DOSE REDUCTION: This exam was performed according to the departmental dose-optimization program which includes automated exposure control, adjustment of the mA and/or kV according to patient size and/or use of iterative reconstruction technique. COMPARISON:  08/07/2021 FINDINGS: Lower chest: Interval development of moderate right and small left pleural effusions. Basilar atelectasis noted in the dependent lower lobes. Hepatobiliary: No suspicious focal abnormality in the liver on this study without intravenous contrast. Gallbladder is nondistended. No intrahepatic or extrahepatic biliary dilation. Pancreas: Dystrophic calcification through the pancreatic parenchyma is compatible with chronic pancreatitis. 16 mm hypoattenuating lesion in the pancreatic tail identified previously is stable. 12 mm hypoattenuating lesion in the tip of the pancreatic tail is also unchanged. No appreciable main duct dilatation. No substantial periappendiceal edema or inflammation. Spleen: No  splenomegaly. No focal mass lesion. Adrenals/Urinary Tract: No adrenal nodule or mass. Kidneys unremarkable. No evidence for hydroureter. The urinary bladder appears normal for the degree of distention. Stomach/Bowel: S

## 2021-08-15 NOTE — Evaluation (Signed)
Physical Therapy Evaluation ?Patient Details ?Name: [name] ?MRN: [id number] ?DOB: Feb 27, 1971 ?Today's Date: 08/15/2021 ? ?History of Present Illness ? [name] is a 51 y.o. male with medical history significant of chronic alcoholic pancreatitis, HTN, chronic anemia, asthma, bipolar disorder, alcohol abuse, presented with recurrent abdominal pain, frequent nauseous vomiting and diarrhea.  ?Clinical Impression ?  ?Pt admitted with above diagnosis. Lives at home, in a single-level home with a flight of steps to enter; Prior to admission, pt was completely independent; Presents to PT with mild unsteadiness with gait;  Pt currently with functional limitations due to the deficits listed below (see PT Problem List). Pt will benefit from skilled PT to increase their independence and safety with mobility to allow discharge to the venue listed below.      ? ?Will plan to return for another acute PT session for stair training (has a flight of stairs to negotiate regularly); I anticipate goo progress, and he will meet mobility goals quickly ?   ? ?Recommendations for follow up therapy are one component of a multi-disciplinary discharge planning process, led by the attending physician.  Recommendations may be updated based on patient status, additional functional criteria and insurance authorization. ? ?Follow Up Recommendations No PT follow up ? ?  ?Assistance Recommended at Discharge None  ?Patient can return home with the following ?   ? ?  ?Equipment Recommendations None recommended by PT  ?Recommendations for Other Services ?    ?  ?Functional Status Assessment Patient has had a recent decline in their functional status and demonstrates the ability to make significant improvements in function in a reasonable and predictable amount of time.  ? ?  ?Precautions / Restrictions Precautions ?Precautions: None ?Restrictions ?Weight Bearing Restrictions: No  ? ?  ? ?Mobility ? Bed Mobility ?Overal bed  mobility: Independent ?  ?  ?  ?  ?  ?  ?  ?  ? ?Transfers ?Overall transfer level: Independent ?  ?  ?  ?  ?  ?  ?  ?  ?  ?  ? ?Ambulation/Gait ?Ambulation/Gait assistance: Supervision ?Gait Distance (Feet): 200 Feet ?Assistive device: IV Pole (and occasional use of hallway rail) ?Gait Pattern/deviations: Step-through pattern ?Gait velocity: approaching normal ?  ?  ?General Gait Details: Light use of IV pole and hallway rail for initial steadying, but evened out to at or near normal gait pattern once up and in the hallway ? ?Stairs ?  ?  ?  ?  ?General stair comments: Will plan to practice a flight of steps next session ? ?Wheelchair Mobility ?  ? ?Modified Rankin (Stroke Patients Only) ?  ? ?  ? ?Balance Overall balance assessment: Mild deficits observed, not formally tested ?  ?  ?  ?  ?  ?  ?  ?  ?  ?  ?  ?  ?  ?  ?  ?  ?  ?  ?   ? ? ? ?Pertinent Vitals/Pain Pain Assessment ?Pain Assessment: Faces ?Faces Pain Scale: Hurts a little bit ?Pain Location: Generalized; pt reports pain "all over" and fatigue from lack of sleep ?Pain Descriptors / Indicators: Discomfort ?Pain Intervention(s): Monitored during session  ? ? ?Home Living Family/patient expects to be discharged to:: Private residence ?Living Arrangements: Other relatives ?Available Help at Discharge: Available PRN/intermittently ?Type of Home:  ("a home") ?Home Access: Stairs to enter ?  ?Entrance Stairs-Number of Steps: 12 ?  ?Home Layout: One level ?Home Equipment: None ?   ?  ?  Prior Function Prior Level of Function : Independent/Modified Independent ?  ?  ?  ?  ?  ?  ?  ?  ?  ? ? ?Hand Dominance  ?   ? ?  ?Extremity/Trunk Assessment  ? Upper Extremity Assessment ?Upper Extremity Assessment: Overall WFL for tasks assessed ?  ? ?Lower Extremity Assessment ?Lower Extremity Assessment: Overall WFL for tasks assessed (soem initial dyscoordination with standing, but smoothed out with progressive amb) ?  ? ?   ?Communication  ? Communication: No difficulties   ?Cognition Arousal/Alertness: Awake/alert (sleepy at first, but fully awake once upright and walking) ?Behavior During Therapy: [facility] for tasks assessed/performed ?Overall Cognitive Status: Within Functional Limits for tasks assessed ?  ?  ?  ?  ?  ?  ?  ?  ?  ?  ?  ?  ?  ?  ?  ?  ?General Comments: WFL for simple mobility tasks ?  ?  ? ?  ?General Comments   ? ?  ?Exercises    ? ?Assessment/Plan  ?  ?PT Assessment Patient needs continued PT services (likely one mroe session for stairs)  ?PT Problem List Decreased activity tolerance;Decreased coordination ? ?   ?  ?PT Treatment Interventions DME instruction;Gait training;Stair training;Functional mobility training;Therapeutic activities;Therapeutic exercise;Patient/family education   ? ?PT Goals (Current goals can be found in the Care Plan section)  ?Acute Rehab PT Goals ?Patient Stated Goal: get fluid off legs ?PT Goal Formulation: With patient ?Time For Goal Achievement: 08/22/21 ?Potential to Achieve Goals: Good ? ?  ?Frequency Min 3X/week ?  ? ? ?Co-evaluation   ?  ?  ?  ?  ? ? ?  ?AM-PAC PT "6 Clicks" Mobility  ?Outcome Measure Help needed turning from your back to your side while in a flat bed without using bedrails?: None ?Help needed moving from lying on your back to sitting on the side of a flat bed without using bedrails?: None ?Help needed moving to and from a bed to a chair (including a wheelchair)?: None ?Help needed standing up from a chair using your arms (e.g., wheelchair or bedside chair)?: None ?Help needed to walk in hospital room?: None ?Help needed climbing 3-5 steps with a railing? : A Little ?6 Click Score: 23 ? ?  ?End of Session Equipment Utilized During Treatment: Gait belt ?Activity Tolerance: Patient tolerated treatment well ?Patient left: in chair;with call bell/phone within reach ?  ?PT Visit Diagnosis: Unsteadiness on feet (R26.81) ?  ? ?Time: 1194-1740 ?PT Time Calculation (min) (ACUTE ONLY): 18 min ? ? ?Charges:   PT Evaluation ?$PT  Eval Low Complexity: 1 Low ?  ?  ?   ? ? ?Roney Marion, PT  ?Acute Rehabilitation Services ?Office [phone] ? ? ?[place] ?08/15/2021, 10:13 AM ? ?

## 2021-08-16 LAB — COMPREHENSIVE METABOLIC PANEL
ALT: 15 U/L (ref 0–44)
AST: 22 U/L (ref 15–41)
Albumin: 2.7 g/dL — ABNORMAL LOW (ref 3.5–5.0)
Alkaline Phosphatase: 64 U/L (ref 38–126)
Anion gap: 6 (ref 5–15)
BUN: <5 mg/dL — ABNORMAL LOW (ref 6–20)
CO2: 25 mmol/L (ref 22–32)
Calcium: 7.8 mg/dL — ABNORMAL LOW (ref 8.9–10.3)
Chloride: 110 mmol/L (ref 98–111)
Creatinine, Ser: 1.08 mg/dL (ref 0.61–1.24)
GFR, Estimated: >60 mL/min (ref >60)
Glucose, Bld: 141 mg/dL — ABNORMAL HIGH (ref 70–99)
Potassium: 3.6 mmol/L (ref 3.5–5.1)
Sodium: 141 mmol/L (ref 135–145)
Total Bilirubin: 0.4 mg/dL (ref 0.3–1.2)
Total Protein: 5.6 g/dL — ABNORMAL LOW (ref 6.5–8.1)

## 2021-08-16 LAB — CBC WITH DIFFERENTIAL/PLATELET
Abs Immature Granulocytes: 0.01 10*3/uL (ref 0.00–0.07)
Basophils Absolute: 0 10*3/uL (ref 0.0–0.1)
Basophils Relative: 1 %
Eosinophils Absolute: 0.2 10*3/uL (ref 0.0–0.5)
Eosinophils Relative: 3 %
HCT: 27.1 % — ABNORMAL LOW (ref 39.0–52.0)
Hemoglobin: 8.4 g/dL — ABNORMAL LOW (ref 13.0–17.0)
Immature Granulocytes: 0 %
Lymphocytes Relative: 33 %
Lymphs Abs: 2.2 10*3/uL (ref 0.7–4.0)
MCH: 28.3 pg (ref 26.0–34.0)
MCHC: 31 g/dL (ref 30.0–36.0)
MCV: 91.2 fL (ref 80.0–100.0)
Monocytes Absolute: 1.2 10*3/uL — ABNORMAL HIGH (ref 0.1–1.0)
Monocytes Relative: 17 %
Neutro Abs: 3.1 10*3/uL (ref 1.7–7.7)
Neutrophils Relative %: 46 %
Platelets: 226 10*3/uL (ref 150–400)
RBC: 2.97 MIL/uL — ABNORMAL LOW (ref 4.22–5.81)
RDW: 19.2 % — ABNORMAL HIGH (ref 11.5–15.5)
WBC: 6.7 10*3/uL (ref 4.0–10.5)
nRBC: 0 % (ref 0.0–0.2)

## 2021-08-16 LAB — BRAIN NATRIURETIC PEPTIDE: B Natriuretic Peptide: 564.1 pg/mL — ABNORMAL HIGH (ref 0.0–100.0)

## 2021-08-16 LAB — LIPASE, BLOOD: Lipase: 30 U/L (ref 11–51)

## 2021-08-16 LAB — MAGNESIUM: Magnesium: 1.2 mg/dL — ABNORMAL LOW (ref 1.7–2.4)

## 2021-08-16 MED ORDER — POTASSIUM CHLORIDE CRYS ER 20 MEQ PO TBCR
40.0000 meq | EXTENDED_RELEASE_TABLET | Freq: Once | ORAL | Status: AC
  Administered 2021-08-16: 40 meq via ORAL
  Filled 2021-08-16: qty 2

## 2021-08-16 MED ORDER — MAGNESIUM SULFATE 4 GM/100ML IV SOLN
4.0000 g | Freq: Once | INTRAVENOUS | Status: AC
  Administered 2021-08-16: 4 g via INTRAVENOUS
  Filled 2021-08-16: qty 100

## 2021-08-16 MED ORDER — FUROSEMIDE 40 MG PO TABS
40.0000 mg | ORAL_TABLET | Freq: Once | ORAL | Status: AC
  Administered 2021-08-16: 40 mg via ORAL
  Filled 2021-08-16: qty 1

## 2021-08-16 NOTE — Progress Notes (Signed)
?                                  PROGRESS NOTE                                             ?                                                                                                                     ?                                         ? ? Patient Demographics:  ? ? [name], is a 51 y.o. male, DOB - 06-Feb-1971, [id number] ? ?Outpatient Primary MD for the patient is Pcp, No    LOS - 0  Admit date - 08/14/2021   ? ?Chief Complaint  ?Patient presents with  ? Abdominal Pain  ?    ? ?Brief Narrative (HPI from H&P)  - 51 y.o. male with medical history significant of chronic alcoholic pancreatitis, HTN, chronic anemia, asthma, bipolar disorder, alcohol abuse, presented with recurrent abdominal pain, frequent nauseous vomiting and diarrhea. Patient was recently hospitalized at [facility] for recurrent alcoholic hepatitis.  Patient was hospitalized 7 days and then released 2 days ago.  He was feeling better after discharge for 1 day, then starting yesterday, patient started to have frequent nauseous vomiting of stomach content nonbilious nonbloody, and cramping-like RUQ pain. ? ? Subjective:  ? ?Patient in bed, appears comfortable, denies any headache, no fever, no chest pain or pressure, no shortness of breath , improved abdominal pain, improved N&V and diarrhea. No new focal weakness. ? ? ? Assessment  & Plan :  ? ?Diarrhea with N&V - in a patient with H/O Alcohol abuse and Chronic Pancreatitis - likely due to Chronic Pancreatitis, CT non acute, non toxic appearance, Creon, Soft diet and monitor with supportive care, follows with [facility].  Niccoli improved likely discharge on 08/17/2021 with outpatient GI follow-up. ? ?2.  Hypokalemia & hypomagnesemia.  Due to GI losses.  Aggressively replaced again on 08/16/2021. ? ?3.  Mild anasarca.  Due to third spacing of fluid caused by poor oral intake, hypoalbuminemia and IV fluids which she received previous  hospitalization due to pancreatitis.  Continue oral protein supplementation and gentle diuresis with TED stockings. ? ?4.  History of alcohol, tobacco and nicotine abuse.  Says last alcohol intake was more than a week ago, counseled to quit, also counseled to stop smoking and chewing tobacco.  CIWA protocol. ? ?5.  Anemia of chronic disease.  Monitor. ? ?6.  Essential hypertension.  On beta-blocker along with as needed hydralazine. ? ?   ? ?  Condition - Fair ? ?Family Communication  :  none present ? ?Code Status :  Full ? ?Consults  :  None ? ?PUD Prophylaxis : PPI ? ? Procedures  :    ? ?CT -  1. Interval development of moderate right and small left pleural effusions with bibasilar atelectasis. 2. Sequelae of chronic pancreatitis. 3. Small lesions in the pancreas mm hypoattenuating are unchanged and may reflect chronic pseudocysts. 4. Aortic Atherosclerosis (ICD10-I70.0). ? ?   ? ?Disposition Plan  :   ? ?Status is: Observation ? ?DVT Prophylaxis  :   ? ?Place TED hose Start: 08/15/21 1113 ?enoxaparin (LOVENOX) injection 40 mg Start: 08/14/21 1745 ?   ? ?Lab Results  ?Component Value Date  ? PLT 226 08/16/2021  ? ? ?Diet :  ?Diet Order   ? ?       ?  DIET SOFT Room service appropriate? Yes; Fluid consistency: Thin  Diet effective now       ?  ? ?  ?  ? ?  ?  ? ?Inpatient Medications ? ?Scheduled Meds: ? (feeding supplement) PROSource Plus  30 mL Oral BID BM  ? enoxaparin (LOVENOX) injection  40 mg Subcutaneous Q24H  ? fluticasone furoate-vilanterol  1 puff Inhalation Daily  ? folic acid  1 mg Oral Daily  ? furosemide  40 mg Oral Once  ? gabapentin  100 mg Oral TID  ? lipase/protease/amylase  72,000 Units Oral TID WC  ? metoprolol tartrate  100 mg Oral BID  ? mirtazapine  15 mg Oral QHS  ? multivitamin with minerals  1 tablet Oral Daily  ? pantoprazole  40 mg Oral Daily  ? potassium chloride  40 mEq Oral Once  ? sertraline  25 mg Oral Daily  ? spironolactone  25 mg Oral Daily  ? sucralfate  1 g Oral TID WC & HS  ?  thiamine  100 mg Oral Daily  ? Or  ? thiamine  100 mg Intravenous Daily  ? [START ON 08/18/2021] Vitamin D (Ergocalciferol)  50,000 Units Oral Q Wed  ? ?Continuous Infusions: ? magnesium sulfate bolus IVPB    ? ?PRN Meds:.acetaminophen, albuterol, hydrALAZINE, loperamide, LORazepam **OR** LORazepam, methocarbamol, morphine injection, ondansetron (ZOFRAN) IV, oxyCODONE-acetaminophen ? ?Antibiotics  :   ? ?Anti-infectives (From admission, onward)  ? ? None  ? ?  ? ? ? Time Spent in minutes  30 ? ? ?Lala Lund M.D on 08/16/2021 at 10:54 AM ? ?To page go to [email or web address]  ? ?[facility] -  Office  [phone] ? ?See all Orders from today for further details ? ? ? Objective:  ? ?Vitals:  ? 08/15/21 1833 08/15/21 1855 08/15/21 2341 08/16/21 0612  ?BP: (!) 132/101 (!) 178/100 (!) 147/94 (!) 135/95  ?Pulse: 86 81 70 (!) 108  ?Resp: '16  18 17  '$ ?Temp: 98.1 ?F (36.7 ?C) 97.9 ?F (36.6 ?C) 98 ?F (36.7 ?C) 98.1 ?F (36.7 ?C)  ?TempSrc: Oral Oral Oral Oral  ?SpO2: 96%  98% 97%  ?Weight:      ?Height:      ? ? ?Wt Readings from Last 3 Encounters:  ?08/14/21 62 kg  ?08/06/21 62.1 kg  ?06/23/21 61.1 kg  ? ? ? ?Intake/Output Summary (Last 24 hours) at 08/16/2021 1054 ?Last data filed at 08/15/2021 1900 ?Gross per 24 hour  ?Intake 711 ml  ?Output --  ?Net 711 ml  ? ? ? ?Physical Exam ? ?Awake Alert, No new F.N deficits, Normal affect ?[place].AT,PERRAL ?Supple Neck,  No JVD,   ?Symmetrical Chest wall movement, Good air movement bilaterally, CTAB ?RRR,No Gallops, Rubs or new Murmurs,  ?+ve B.Sounds, Abd Soft, No tenderness,   ?No Cyanosis, trace edema, TEDs ?  ? ? Data Review:  ? ? ?CBC ?Recent Labs  ?Lab 08/14/21 ?1440 08/15/21 ?0411 08/16/21 ?0500  ?WBC 7.6 7.0 6.7  ?HGB 9.0* 9.0* 8.4*  ?HCT 28.4* 28.4* 27.1*  ?PLT 233 221 226  ?MCV 89.6 91.6 91.2  ?MCH 28.4 29.0 28.3  ?MCHC 31.7 31.7 31.0  ?RDW 19.0* 19.3* 19.2*  ?LYMPHSABS  --   --  2.2  ?MONOABS  --   --  1.2*  ?EOSABS  --   --  0.2  ?BASOSABS  --   --  0.0   ? ? ?Electrolytes ?Recent Labs  ?Lab 08/14/21 ?1440 08/14/21 ?2100 08/15/21 ?0411 08/16/21 ?0500  ?NA 144  --   --  141  ?K 3.2*  --   --  3.6  ?CL 113*  --   --  110  ?CO2 21*  --   --  25  ?GLUCOSE 78  --   --  141*  ?BUN <5*  --   --  <5*  ?CREATININE 0.82  --   --  1.08  ?CALCIUM 7.8*  --   --  7.8*  ?AST 29  --   --  22  ?ALT 17  --   --  15  ?ALKPHOS 68  --   --  64  ?BILITOT 0.6  --   --  0.4  ?ALBUMIN 2.7*  --   --  2.7*  ?MG 0.9* 1.2* 1.5* 1.2*  ?BNP  --   --   --  564.1*  ? ? ?------------------------------------------------------------------------------------------------------------------ ?No results for input(s): CHOL, HDL, LDLCALC, TRIG, CHOLHDL, LDLDIRECT in the last 72 hours. ? ?Lab Results  ?Component Value Date  ? HGBA1C 4.6 (L) 11/13/2018  ? ? ?No results for input(s): TSH, T4TOTAL, T3FREE, THYROIDAB in the last 72 hours. ? ?Invalid input(s): FREET3 ?------------------------------------------------------------------------------------------------------------------ ?ID Labs ?Recent Labs  ?Lab 08/14/21 ?1440 08/15/21 ?0411 08/16/21 ?0500  ?WBC 7.6 7.0 6.7  ?PLT 233 221 226  ?CREATININE 0.82  --  1.08  ? ?Radiology Reports ?CT ABDOMEN PELVIS WO CONTRAST ? ?Result Date: 08/14/2021 ?CLINICAL DATA:  Pancreatitis. EXAM: CT ABDOMEN AND PELVIS WITHOUT CONTRAST TECHNIQUE: Multidetector CT imaging of the abdomen and pelvis was performed following the standard protocol without IV contrast. RADIATION DOSE REDUCTION: This exam was performed according to the departmental dose-optimization program which includes automated exposure control, adjustment of the mA and/or kV according to patient size and/or use of iterative reconstruction technique. COMPARISON:  08/07/2021 FINDINGS: Lower chest: Interval development of moderate right and small left pleural effusions. Basilar atelectasis noted in the dependent lower lobes. Hepatobiliary: No suspicious focal abnormality in the liver on this study without intravenous  contrast. Gallbladder is nondistended. No intrahepatic or extrahepatic biliary dilation. Pancreas: Dystrophic calcification through the pancreatic parenchyma is compatible with chronic pancreatitis. 16 mm hypoattenuating lesion in the pa

## 2021-08-16 NOTE — Plan of Care (Signed)

## 2021-08-16 NOTE — TOC Initial Note (Signed)
Transition of Care (TOC) - Initial/Assessment Note  ? ? ?Patient Details  ?Name: [name] ?MRN: [id number] ?Date of Birth: [birth year] ? ?Transition of Care (TOC) CM/SW Contact:    ?Tom-Johnson, Renea Ee, RN ?Phone Number: ?08/16/2021, 2:25 PM ? ?Clinical Narrative:                 ? ?CM spoke with patient at bedside about needs for post hospital transition. Admitted for Hypomagnesemia. Patient declined talking to CM. CM asked if there was a better time to talk and patient states "I'm not telling you anything". CM advised patient to call when he is ready to talk or needs anything.  ?No PT/OT followup noted.  ? ? ?Expected Discharge Plan: Home/Self Care ?Barriers to Discharge: Continued Medical Work up ? ? ?Patient Goals and CMS Choice ?Patient states their goals for this hospitalization and ongoing recovery are:: To return home ?CMS Medicare.gov Compare Post Acute Care list provided to:: Patient ?Choice offered to / list presented to : NA ? ?Expected Discharge Plan and Services ?Expected Discharge Plan: Home/Self Care ?  ?Discharge Planning Services: CM Consult ?Post Acute Care Choice: NA ?Living arrangements for the past 2 months: [place] ?                ?DME Arranged: N/A ?DME Agency: NA ?  ?  ?  ?HH Arranged: NA ?[place] Agency: NA ?  ?  ?  ? ?Prior Living Arrangements/Services ?Living arrangements for the past 2 months: [place] ?Lives with:: Parents (Mother) ?Patient language and need for interpreter reviewed:: Yes ?       ?Need for Family Participation in Patient Care: Yes (Comment) ?  ?  ?Criminal Activity/Legal Involvement Pertinent to Current Situation/Hospitalization: No - Comment as needed ? ?Activities of Daily Living ?Home Assistive Devices/Equipment: None ?ADL Screening (condition at time of admission) ?Patient's cognitive ability adequate to safely complete daily activities?: Yes ?Is the patient deaf or have difficulty hearing?: No ?Does the patient have difficulty  seeing, even when wearing glasses/contacts?: No ?Does the patient have difficulty concentrating, remembering, or making decisions?: No ?Patient able to express need for assistance with ADLs?: Yes ?Does the patient have difficulty dressing or bathing?: No ?Independently performs ADLs?: Yes (appropriate for developmental age) ?Does the patient have difficulty walking or climbing stairs?: No ?Weakness of Legs: None ?Weakness of Arms/Hands: None ? ?Permission Sought/Granted ?Permission sought to share information with : Case Manager, Family Supports ?  ?   ?   ?   ?   ? ?Emotional Assessment ?Appearance:: Appears stated age ?Attitude/Demeanor/Rapport: Uncooperative ?Affect (typically observed): Apprehensive ?Orientation: : Oriented to Self, Oriented to Place, Oriented to  Time, Oriented to Situation ?  ?Psych Involvement: No (comment) ? ?Admission diagnosis:  Hypokalemia [E87.6] ?Hypomagnesemia [E83.42] ?Hypoalbuminemia [E88.09] ?Pleural effusion [J90] ?Leg swelling [M79.89] ?Generalized abdominal pain [R10.84] ?Patient Active Problem List  ? Diagnosis Date Noted  ? Leukocytosis 06/07/2021  ? Epigastric pain 06/07/2021  ? Acute alcoholic pancreatitis 34/19/3790  ? Urinary retention   ? Protein-calorie malnutrition, severe 11/17/2020  ? Acute pancreatitis 11/15/2020  ? Hypomagnesemia   ? AKI (acute kidney injury) ([place]) 11/13/2018  ? Seizure ([place]) 11/13/2018  ? Acute on chronic pancreatitis ([place]) 09/28/2017  ? Abdominal pain 05/27/2017  ? Nausea & vomiting 03/18/2017  ? Normocytic anemia 12/05/2016  ? Alcohol use disorder, severe, dependence ([place]) 07/25/2016  ? Cocaine use disorder, severe, dependence ([place]) 07/25/2016  ? Major depressive disorder, recurrent severe without psychotic features ([place])  07/20/2016  ? Chronic pancreatitis ([place]) 05/18/2015  ? Essential hypertension 02/06/2014  ? Mood disorder ([place]) 02/06/2014  ? Pancreatic pseudocyst/cyst 11/25/2013  ? 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 Tobacco abuse 03/23/2007  ? ?PCP:  Pcp, No ?Pharmacy:   ?[facility] [address] ?[place] [address] ?Phone: [phone] Fax: [phone] ? ?[id number] - [place], [address] ?[address] ?[place] [address] ?Phone: [phone] Fax: [phone] ? ? ? ? ?Social Determinants of Health (SDOH) Interventions ?  ? ?Readmission Risk Interventions ? ?  06/08/2021  ? 11:22 AM 08/29/2019  ?  3:45 PM  ?Readmission Risk Prevention Plan  ?Transportation Screening Complete Complete  ?Medication Review (RN Care Manager) Referral to Pharmacy Complete  ?PCP or Specialist appointment within 3-5 days of discharge Not Complete Complete  ?PCP/Specialist Appt Not Complete comments patient not ready to d/c   ?[place] or Home Care Consult Complete Complete  ?SW Recovery Care/Counseling Consult Complete Complete  ?Palliative Care Screening Not Applicable Not Applicable  ?[place] Not Applicable Not Applicable  ? ? ? ?

## 2021-08-16 NOTE — Progress Notes (Signed)
Physical Therapy Treatment and Discharge ?Patient Details ?Name: Maleko Greulich ?MRN: [id number] ?DOB: 21-Jun-1970 ?Today's Date: 08/16/2021 ? ? ?History of Present Illness [place] Dani is a 51 y.o. male with medical history significant of chronic alcoholic pancreatitis, HTN, chronic anemia, asthma, bipolar disorder, alcohol abuse, presented with recurrent abdominal pain, frequent nauseous vomiting and diarrhea. ? ?  ?PT Comments  ? ? Session focused on progressive amb, and to assess managing a flight of steps; Pt moving well, and independent with ambulation and managing a flight of steps; Acute PT goals met; will sign off   ?Recommendations for follow up therapy are one component of a multi-disciplinary discharge planning process, led by the attending physician.  Recommendations may be updated based on patient status, additional functional criteria and insurance authorization. ? ?Follow Up Recommendations ? No PT follow up ?  ?  ?Assistance Recommended at Discharge None  ?Patient can return home with the following   ?  ?Equipment Recommendations ? None recommended by PT  ?  ?Recommendations for Other Services   ? ? ?  ?Precautions / Restrictions Precautions ?Precautions: None ?Restrictions ?Weight Bearing Restrictions: No  ?  ? ?Mobility ? Bed Mobility ?Overal bed mobility: Independent ?  ?  ?  ?  ?  ?  ?  ?  ? ?Transfers ?Overall transfer level: Independent ?  ?  ?  ?  ?  ?  ?  ?  ?  ?  ? ?Ambulation/Gait ?Ambulation/Gait assistance: Independent ?Gait Distance (Feet): 400 Feet ?Assistive device: None ?Gait Pattern/deviations: WFL(Within Functional Limits) ?Gait velocity: Normal ?  ?  ?General Gait Details: No difficulty and good gait velocity ? ? ?Stairs ?Stairs: Yes ?Stairs assistance: Modified independent (Device/Increase time) ?Stair Management: One rail Right, Alternating pattern, Forwards ?Number of Stairs: 12 ?General stair comments: no difficulty ? ? ?Wheelchair Mobility ?  ? ?Modified Rankin  (Stroke Patients Only) ?  ? ? ?  ?Balance Overall balance assessment: Mild deficits observed, not formally tested ?  ?  ?  ?  ?  ?  ?  ?  ?  ?  ?  ?  ?  ?  ?  ?  ?  ?  ?  ? ?  ?Cognition Arousal/Alertness: Awake/alert (sleepy at first, but fully awake once upright and walking) ?Behavior During Therapy: [facility] for tasks assessed/performed ?Overall Cognitive Status: Within Functional Limits for tasks assessed ?  ?  ?  ?  ?  ?  ?  ?  ?  ?  ?  ?  ?  ?  ?  ?  ?General Comments: WFL for simple mobility tasks ?  ?  ? ?  ?Exercises   ? ?  ?General Comments General comments (skin integrity, edema, etc.): Pt showed this PT his inhaler meds and indicated it is not his normal med; I notified his nurse ?  ?  ? ?Pertinent Vitals/Pain Pain Assessment ?Pain Assessment: Faces ?Faces Pain Scale: No hurt ?Pain Intervention(s): Monitored during session  ? ? ?Home Living   ?  ?  ?  ?  ?  ?  ?  ?  ?  ?   ?  ?Prior Function    ?  ?  ?   ? ?PT Goals (current goals can now be found in the care plan section) Acute Rehab PT Goals ?Patient Stated Goal: Get questions about his pulmonary meds answered ?Progress towards PT goals: Goals met/education completed, patient discharged from PT ? ?  ?Frequency ? ? ?  Min 3X/week ? ? ? ?  ?PT Plan Current plan remains appropriate  ? ? ?Co-evaluation   ?  ?  ?  ?  ? ?  ?AM-PAC PT "6 Clicks" Mobility   ?Outcome Measure ? Help needed turning from your back to your side while in a flat bed without using bedrails?: None ?Help needed moving from lying on your back to sitting on the side of a flat bed without using bedrails?: None ?Help needed moving to and from a bed to a chair (including a wheelchair)?: None ?Help needed standing up from a chair using your arms (e.g., wheelchair or bedside chair)?: None ?Help needed to walk in hospital room?: None ?Help needed climbing 3-5 steps with a railing? : None ?6 Click Score: 24 ? ?  ?End of Session   ?Activity Tolerance: Patient tolerated treatment well ?Patient left:  Other (comment) (Managing independently in room/hallway) ?Nurse Communication: Mobility status (Pt with questions re: his pulmonary medication) ?PT Visit Diagnosis: Unsteadiness on feet (R26.81) ?  ? ? ?Time: 4536-4680 ?PT Time Calculation (min) (ACUTE ONLY): 25 min ? ?Charges:  $Gait Training: 23-37 mins          ?          ? ?Roney Marion, PT  ?Acute Rehabilitation Services ?Office [phone] ? ? ? ?[place] ?08/16/2021, 2:18 PM ? ?

## 2021-08-17 ENCOUNTER — Other Ambulatory Visit (HOSPITAL_COMMUNITY): Payer: Self-pay

## 2021-08-17 ENCOUNTER — Observation Stay (HOSPITAL_COMMUNITY): Payer: Medicaid Other

## 2021-08-17 DIAGNOSIS — I10 Essential (primary) hypertension: Secondary | ICD-10-CM | POA: Diagnosis present

## 2021-08-17 DIAGNOSIS — Z91040 Latex allergy status: Secondary | ICD-10-CM | POA: Diagnosis not present

## 2021-08-17 DIAGNOSIS — Z8711 Personal history of peptic ulcer disease: Secondary | ICD-10-CM | POA: Diagnosis not present

## 2021-08-17 DIAGNOSIS — K7010 Alcoholic hepatitis without ascites: Secondary | ICD-10-CM | POA: Diagnosis present

## 2021-08-17 DIAGNOSIS — Z765 Malingerer [conscious simulation]: Secondary | ICD-10-CM | POA: Diagnosis not present

## 2021-08-17 DIAGNOSIS — E8809 Other disorders of plasma-protein metabolism, not elsewhere classified: Secondary | ICD-10-CM | POA: Diagnosis present

## 2021-08-17 DIAGNOSIS — Z886 Allergy status to analgesic agent status: Secondary | ICD-10-CM | POA: Diagnosis not present

## 2021-08-17 DIAGNOSIS — R601 Generalized edema: Secondary | ICD-10-CM | POA: Diagnosis present

## 2021-08-17 DIAGNOSIS — F17200 Nicotine dependence, unspecified, uncomplicated: Secondary | ICD-10-CM | POA: Diagnosis present

## 2021-08-17 DIAGNOSIS — Z8249 Family history of ischemic heart disease and other diseases of the circulatory system: Secondary | ICD-10-CM | POA: Diagnosis not present

## 2021-08-17 DIAGNOSIS — E7800 Pure hypercholesterolemia, unspecified: Secondary | ICD-10-CM | POA: Diagnosis present

## 2021-08-17 DIAGNOSIS — Z808 Family history of malignant neoplasm of other organs or systems: Secondary | ICD-10-CM | POA: Diagnosis not present

## 2021-08-17 DIAGNOSIS — Z6372 Alcoholism and drug addiction in family: Secondary | ICD-10-CM | POA: Diagnosis not present

## 2021-08-17 DIAGNOSIS — Z7982 Long term (current) use of aspirin: Secondary | ICD-10-CM | POA: Diagnosis not present

## 2021-08-17 DIAGNOSIS — F4310 Post-traumatic stress disorder, unspecified: Secondary | ICD-10-CM | POA: Diagnosis present

## 2021-08-17 DIAGNOSIS — D638 Anemia in other chronic diseases classified elsewhere: Secondary | ICD-10-CM | POA: Diagnosis present

## 2021-08-17 DIAGNOSIS — Z811 Family history of alcohol abuse and dependence: Secondary | ICD-10-CM | POA: Diagnosis not present

## 2021-08-17 DIAGNOSIS — F319 Bipolar disorder, unspecified: Secondary | ICD-10-CM | POA: Diagnosis present

## 2021-08-17 DIAGNOSIS — F1410 Cocaine abuse, uncomplicated: Secondary | ICD-10-CM | POA: Diagnosis present

## 2021-08-17 DIAGNOSIS — D573 Sickle-cell trait: Secondary | ICD-10-CM | POA: Diagnosis present

## 2021-08-17 DIAGNOSIS — E876 Hypokalemia: Secondary | ICD-10-CM | POA: Diagnosis present

## 2021-08-17 DIAGNOSIS — K860 Alcohol-induced chronic pancreatitis: Secondary | ICD-10-CM | POA: Diagnosis present

## 2021-08-17 DIAGNOSIS — I493 Ventricular premature depolarization: Secondary | ICD-10-CM | POA: Diagnosis present

## 2021-08-17 LAB — CBC WITH DIFFERENTIAL/PLATELET
Abs Immature Granulocytes: 0.02 10*3/uL (ref 0.00–0.07)
Basophils Absolute: 0 10*3/uL (ref 0.0–0.1)
Basophils Relative: 0 %
Eosinophils Absolute: 0.2 10*3/uL (ref 0.0–0.5)
Eosinophils Relative: 3 %
HCT: 24.9 % — ABNORMAL LOW (ref 39.0–52.0)
Hemoglobin: 7.9 g/dL — ABNORMAL LOW (ref 13.0–17.0)
Immature Granulocytes: 0 %
Lymphocytes Relative: 26 %
Lymphs Abs: 1.9 10*3/uL (ref 0.7–4.0)
MCH: 28.3 pg (ref 26.0–34.0)
MCHC: 31.7 g/dL (ref 30.0–36.0)
MCV: 89.2 fL (ref 80.0–100.0)
Monocytes Absolute: 1.2 10*3/uL — ABNORMAL HIGH (ref 0.1–1.0)
Monocytes Relative: 16 %
Neutro Abs: 3.9 10*3/uL (ref 1.7–7.7)
Neutrophils Relative %: 55 %
Platelets: 196 10*3/uL (ref 150–400)
RBC: 2.79 MIL/uL — ABNORMAL LOW (ref 4.22–5.81)
RDW: 19.7 % — ABNORMAL HIGH (ref 11.5–15.5)
WBC: 7.3 10*3/uL (ref 4.0–10.5)
nRBC: 0 % (ref 0.0–0.2)

## 2021-08-17 LAB — COMPREHENSIVE METABOLIC PANEL
ALT: 16 U/L (ref 0–44)
AST: 37 U/L (ref 15–41)
Albumin: 2.6 g/dL — ABNORMAL LOW (ref 3.5–5.0)
Alkaline Phosphatase: 54 U/L (ref 38–126)
Anion gap: 6 (ref 5–15)
BUN: 9 mg/dL (ref 6–20)
CO2: 24 mmol/L (ref 22–32)
Calcium: 8.3 mg/dL — ABNORMAL LOW (ref 8.9–10.3)
Chloride: 110 mmol/L (ref 98–111)
Creatinine, Ser: 0.81 mg/dL (ref 0.61–1.24)
GFR, Estimated: >60 mL/min (ref >60)
Glucose, Bld: 149 mg/dL — ABNORMAL HIGH (ref 70–99)
Potassium: 5.2 mmol/L — ABNORMAL HIGH (ref 3.5–5.1)
Sodium: 140 mmol/L (ref 135–145)
Total Bilirubin: 0.7 mg/dL (ref 0.3–1.2)
Total Protein: 5.4 g/dL — ABNORMAL LOW (ref 6.5–8.1)

## 2021-08-17 LAB — MAGNESIUM: Magnesium: 1.5 mg/dL — ABNORMAL LOW (ref 1.7–2.4)

## 2021-08-17 LAB — BRAIN NATRIURETIC PEPTIDE: B Natriuretic Peptide: 547.5 pg/mL — ABNORMAL HIGH (ref 0.0–100.0)

## 2021-08-17 LAB — GLUCOSE, CAPILLARY
Glucose-Capillary: 158 mg/dL — ABNORMAL HIGH (ref 70–99)
Glucose-Capillary: 68 mg/dL — ABNORMAL LOW (ref 70–99)

## 2021-08-17 MED ORDER — DEXTROSE 50 % IV SOLN
12.5000 g | INTRAVENOUS | Status: AC
  Administered 2021-08-17: 12.5 g via INTRAVENOUS
  Filled 2021-08-17: qty 50

## 2021-08-17 MED ORDER — SODIUM POLYSTYRENE SULFONATE 15 GM/60ML PO SUSP
30.0000 g | Freq: Once | ORAL | Status: AC
Start: 2021-08-17 — End: 2021-08-17
  Administered 2021-08-17: 30 g via ORAL
  Filled 2021-08-17: qty 120

## 2021-08-17 MED ORDER — ALBUTEROL SULFATE HFA 108 (90 BASE) MCG/ACT IN AERS
2.0000 | INHALATION_SPRAY | RESPIRATORY_TRACT | 0 refills | Status: DC | PRN
  Filled 2021-08-17: qty 18, 25d supply, fill #0

## 2021-08-17 MED ORDER — LACTATED RINGERS IV SOLN
INTRAVENOUS | Status: AC
Start: 2021-08-17 — End: 2021-08-18

## 2021-08-17 MED ORDER — MAGNESIUM SULFATE 4 GM/100ML IV SOLN
4.0000 g | Freq: Once | INTRAVENOUS | Status: AC
  Administered 2021-08-17: 4 g via INTRAVENOUS
  Filled 2021-08-17: qty 100

## 2021-08-17 MED ORDER — SODIUM ZIRCONIUM CYCLOSILICATE 10 G PO PACK
10.0000 g | PACK | Freq: Once | ORAL | Status: AC
  Administered 2021-08-17: 10 g via ORAL
  Filled 2021-08-17: qty 1

## 2021-08-17 MED ORDER — ONDANSETRON 4 MG PO TBDP
4.0000 mg | ORAL_TABLET | Freq: Three times a day (TID) | ORAL | 0 refills | Status: DC | PRN
  Filled 2021-08-17: qty 20, 7d supply, fill #0

## 2021-08-17 MED ORDER — TRAMADOL HCL 50 MG PO TABS
50.0000 mg | ORAL_TABLET | Freq: Four times a day (QID) | ORAL | Status: DC | PRN
  Administered 2021-08-17 (×2): 50 mg via ORAL
  Filled 2021-08-17 (×3): qty 1

## 2021-08-17 NOTE — Progress Notes (Signed)
Note patient is exhibiting narcotic and drug-seeking behavior, he was seen walking around in the hallway looking for snacks in no distress, this morning he told me that he threw up 3 times yesterday however the nurse from yesterday confirms that he had no emesis. ? ?He said he would be willing to go home if he got some narcotics today, this was declined and he was discharged home subsequently he called the nurse to the room and said he threw up and I was clear liquid on the floor. ? ?Cannot rule out that he truly did not have emesis, will make him n.p.o., check KUB and monitor.  No IV narcotics will be provided.  Has a benign exam and appears to be in no distress. ?

## 2021-08-17 NOTE — Progress Notes (Signed)
Pt came to the front desk with a pitcher and asked for it to be filled up with ice, explained to pt that the doctor wrote an order that he can only have sips of water with meds.Pt started yelling in the hall being verbally abusive to the RN and demanding something to drink.Security called and the [facility] called to talk to the patient,the pt verbalized understanding and  calmed down and now ambulating up and down the halls for thirty minutes. ?

## 2021-08-17 NOTE — Progress Notes (Signed)
Pt cbg 68,12.5 dextrose given per protocol cbg 158 now will continue to monitor. ?

## 2021-08-17 NOTE — Discharge Summary (Addendum)
?                                                                                ? ?[name] IHK:[id number] DOB: 09-22-1970 DOA: 08/14/2021 ? ?PCP: Pcp, No ? ?Admit date: 08/14/2021  Discharge date: 08/18/2021 ? ?Admitted From: Home   Disposition:  Home ? ? ?Recommendations for Outpatient Follow-up:  ? ?Follow up with PCP in 1-2 weeks ? ?PCP Please obtain BMP/CBC, 2 view CXR in 1week,  (see Discharge instructions)  ? ?PCP Please follow up on the following pending results: Needs close outpatient GI follow-up, monitor electrolytes including magnesium levels closely. ? ? ?Home Health: None   ?Equipment/Devices: None  ?Consultations: None  ?Discharge Condition: Stable    ?CODE STATUS: Full    ?Diet Recommendation: Heart Healthy  ? ?Diet Order   ? ?       ?  Diet full liquid Room service appropriate? Yes; Fluid consistency: Thin  Diet effective now       ?  ? ?  ?  ? ?  ?  ? ?Chief Complaint  ?Patient presents with  ? Abdominal Pain  ?  ? ?Brief history of present illness from the day of admission and additional interim summary   ? ?51 y.o. male with medical history significant of chronic alcoholic pancreatitis, HTN, chronic anemia, asthma, bipolar disorder, alcohol abuse, presented with recurrent abdominal pain, frequent nauseous vomiting and diarrhea. Patient was recently hospitalized at [facility] for recurrent alcoholic hepatitis.  Patient was hospitalized 7 days and then released 2 days ago.  He was feeling better after discharge for 1 day, then starting yesterday, patient started to have frequent nauseous vomiting of stomach content nonbilious nonbloody, and cramping-like RUQ pain. ? ?                                                               Hospital Course  ? ? Diarrhea with N&V + ? Abd pain - in a patient with H/O Alcohol abuse and Chronic Pancreatitis -recent admission to outlying hospital few days  prior to this admission.  CT scan nonacute, he had no diarrhea nausea vomiting episodes in the last 24 hours, he was frequently seen walking in the hallway trying to get food or snacks and in no distress whatsoever, his behavior was highly narcotic seeking.  However there is a chance that he could have some intermittent symptoms from chronic pancreatitis for which he will continue his home medications including Creon.  Have requested him to follow-up with his PCP and primary gastroenterologist within a week for follow-up.  ? ?Patient was discharged yesterday morning but he gagged and threw up mostly clear water and wanted to be kept for pain management, he was made n.p.o except for oral medication. IV pain medications were withheld.  This morning he wants to be discharged home immediately after he gets a "pain shot for home" no nausea vomiting or emesis.  Note patient has been  exhibiting narcotic seeking behavior throughout his hospital stay.  This morning says he feels good and if he gets the pain medication he is ready to go home.  Will be discharged home ? ? ?2.  Hypokalemia & hypomagnesemia.  Due to GI losses.  Aggressively replaced again on 08/16/2021. ?  ?3.  Mild anasarca.  Due to third spacing of fluid caused by poor oral intake, hypoalbuminemia and IV fluids which he received previous hospitalization due to pancreatitis.  Continue oral protein supplementation, much improved after gentle diuresis and TED stockings.  PCP to monitor ?  ?4.  History of alcohol, tobacco and nicotine abuse.  Says last alcohol intake was more than a week ago, counseled to quit, also counseled to stop smoking and chewing tobacco.  No DTs here. ?  ?5.  Anemia of chronic disease.  Monitor. ?  ?6.  Essential hypertension.  Continue home regimen unchanged ? ?7.  COPD.  Continue home regimen unchanged. ? ? ?Discharge diagnosis   ? ? ?Principal Problem: ?  Hypomagnesemia ? ? ? ?Discharge instructions   ? ?Discharge Instructions   ? ?  Discharge instructions   Complete by: As directed ?  ? Follow with Primary MD and your gastroenterologist in 7 days  ? ?Get CBC, CMP, magnesium levels, 2 view Chest X ray -  checked next visit within 1 week by Primary MD  ? ?Activity: As tolerated with Full fall precautions use walker/cane & assistance as needed ? ?Disposition Home  ? ?Diet: Heart Healthy with 1.5 L total fluid restriction per day. ? ?Special Instructions: If you have smoked or chewed Tobacco  in the last 2 yrs please stop smoking, stop any regular Alcohol  and or any Recreational drug use. ? ?On your next visit with your primary care physician please Get Medicines reviewed and adjusted. ? ?Please request your Prim.MD to go over all Hospital Tests and Procedure/Radiological results at the follow up, please get all Hospital records sent to your Prim MD by signing hospital release before you go home. ? ?If you experience worsening of your admission symptoms, develop shortness of breath, life threatening emergency, suicidal or homicidal thoughts you must seek medical attention immediately by calling 911 or calling your MD immediately  if symptoms less severe. ? ?You Must read complete instructions/literature along with all the possible adverse reactions/side effects for all the Medicines you take and that have been prescribed to you. Take any new Medicines after you have completely understood and accpet all the possible adverse reactions/side effects.  ? Increase activity slowly   Complete by: As directed ?  ? ?  ? ? ?Discharge Medications  ? ?Allergies as of 08/18/2021   ? ?   Reactions  ? Robaxin [methocarbamol] Other (See Comments)  ? "jumpy limbs"  ? Aspirin Other (See Comments)  ? Unknown reaction  ? Shellfish-derived Products Nausea And Vomiting, Rash  ? Trazodone And Nefazodone Other (See Comments)  ? Muscle spasms  ? Adhesive [tape] Itching  ? Latex Itching  ? Toradol [ketorolac Tromethamine] Other (See Comments)  ? Has ulcers; cannot have this   ? Contrast Media [iodinated Contrast Media] Hives  ? Reglan [metoclopramide] Other (See Comments)  ? Muscle spasms  ? Salmon [fish Oil] Nausea And Vomiting, Rash  ? ?  ? ?  ?Medication List  ?  ? ?STOP taking these medications   ? ?amoxicillin-clavulanate 875-125 MG tablet ?Commonly known as: AUGMENTIN ?  ?Breo Ellipta 200-25 MCG/ACT Aepb ?Generic drug: fluticasone furoate-vilanterol ?  ?  mirtazapine 15 MG tablet ?Commonly known as: REMERON ?  ?sertraline 100 MG tablet ?Commonly known as: ZOLOFT ?  ? ?  ? ?TAKE these medications   ? ?acetaminophen 500 MG tablet ?Commonly known as: TYLENOL ?Take 500 mg by mouth every 6 (six) hours as needed for moderate pain or headache. ?  ?folic acid 1 MG tablet ?Commonly known as: FOLVITE ?Take 1 tablet (1 mg total) by mouth daily. ?  ?gabapentin 300 MG capsule ?Commonly known as: NEURONTIN ?Take 300 mg by mouth 3 (three) times daily as needed (for neuropathic pain). ?What changed: Another medication with the same name was removed. Continue taking this medication, and follow the directions you see here. ?  ?lidocaine 2 % solution ?Commonly known as: XYLOCAINE ?Use as directed 15 mLs in the mouth or throat every 6 (six) hours as needed for mouth pain. ?  ?lipase/protease/amylase 36000 UNITS Cpep capsule ?Commonly known as: CREON ?Take 2 capsules (72,000 Units total) by mouth 3 (three) times daily with meals. For pancreatitis ?  ?magnesium oxide 400 (240 Mg) MG tablet ?Commonly known as: MAG-OX ?Take 400 mg by mouth daily. ?  ?methocarbamol 750 MG tablet ?Commonly known as: ROBAXIN ?Take 750 mg by mouth every 6 (six) hours as needed for muscle spasms. ?  ?metoprolol 200 MG 24 hr tablet ?Commonly known as: TOPROL-XL ?Take 100 mg by mouth daily. ?  ?multivitamin with minerals Tabs tablet ?Take 1 tablet by mouth daily. ?  ?omeprazole 40 MG capsule ?Commonly known as: PRILOSEC ?Take 1 capsule (40 mg total) by mouth 2 (two) times daily before a meal. ?  ?ondansetron 4 MG disintegrating  tablet ?Commonly known as: ZOFRAN-ODT ?Take 1 tablet (4 mg total) by mouth every 8 (eight) hours as needed for vomiting or nausea. ?  ?oxyCODONE-acetaminophen 5-325 MG tablet ?Commonly known as: PERCOCET/ROX

## 2021-08-17 NOTE — Progress Notes (Signed)
RN heard patient on the phone saying  that he was feeling dizzy. This RN ask the patient if he was was feeling dizzy he says no. This RN ask several time if he feels dizzy he states no. This RN remind patient to please call before getting out of bed and let him know I will put on bed alarm on so that he would remember to call before getting out of bed. Patient got out of bed and when to the bathroom without calling he states that he does not want the alarm on his bed.  ?

## 2021-08-17 NOTE — Progress Notes (Signed)
MD saw patient this morning and let him know that he will be discharge. Patient told MD that he throw up yesterday at lunch or dinner this RN had the patient yesterday he only report nausea. No report of vomiting last night. ? ?After MD left the unit patient report that he throw up. This episodes seem suspicious. Patient was asking for pain medication at 7:45 am this is after patient got pain medication at 7:14 am. RN was call and see emesis on the floor and clean it up. RN will continue to monitor patient.  ?

## 2021-08-17 NOTE — Discharge Instructions (Signed)
Follow with Primary MD and your gastroenterologist in 7 days  ? ?Get CBC, CMP, magnesium levels, 2 view Chest X ray -  checked next visit within 1 week by Primary MD  ? ?Activity: As tolerated with Full fall precautions use walker/cane & assistance as needed ? ?Disposition Home  ? ?Diet: Heart Healthy with 1.5 L total fluid restriction per day. ? ?Special Instructions: If you have smoked or chewed Tobacco  in the last 2 yrs please stop smoking, stop any regular Alcohol  and or any Recreational drug use. ? ?On your next visit with your primary care physician please Get Medicines reviewed and adjusted. ? ?Please request your Prim.MD to go over all Hospital Tests and Procedure/Radiological results at the follow up, please get all Hospital records sent to your Prim MD by signing hospital release before you go home. ? ?If you experience worsening of your admission symptoms, develop shortness of breath, life threatening emergency, suicidal or homicidal thoughts you must seek medical attention immediately by calling 911 or calling your MD immediately  if symptoms less severe. ? ?You Must read complete instructions/literature along with all the possible adverse reactions/side effects for all the Medicines you take and that have been prescribed to you. Take any new Medicines after you have completely understood and accpet all the possible adverse reactions/side effects.  ? ?  ?

## 2021-08-18 LAB — CBC WITH DIFFERENTIAL/PLATELET
Abs Immature Granulocytes: 0.03 10*3/uL (ref 0.00–0.07)
Basophils Absolute: 0 10*3/uL (ref 0.0–0.1)
Basophils Relative: 0 %
Eosinophils Absolute: 0.2 10*3/uL (ref 0.0–0.5)
Eosinophils Relative: 3 %
HCT: 25.5 % — ABNORMAL LOW (ref 39.0–52.0)
Hemoglobin: 8.4 g/dL — ABNORMAL LOW (ref 13.0–17.0)
Immature Granulocytes: 0 %
Lymphocytes Relative: 22 %
Lymphs Abs: 1.8 10*3/uL (ref 0.7–4.0)
MCH: 28.2 pg (ref 26.0–34.0)
MCHC: 32.9 g/dL (ref 30.0–36.0)
MCV: 85.6 fL (ref 80.0–100.0)
Monocytes Absolute: 1.5 10*3/uL — ABNORMAL HIGH (ref 0.1–1.0)
Monocytes Relative: 18 %
Neutro Abs: 4.6 10*3/uL (ref 1.7–7.7)
Neutrophils Relative %: 57 %
Platelets: 211 10*3/uL (ref 150–400)
RBC: 2.98 MIL/uL — ABNORMAL LOW (ref 4.22–5.81)
RDW: 18.9 % — ABNORMAL HIGH (ref 11.5–15.5)
WBC: 8.2 10*3/uL (ref 4.0–10.5)
nRBC: 0 % (ref 0.0–0.2)

## 2021-08-18 LAB — COMPREHENSIVE METABOLIC PANEL
ALT: 14 U/L (ref 0–44)
AST: 23 U/L (ref 15–41)
Albumin: 2.8 g/dL — ABNORMAL LOW (ref 3.5–5.0)
Alkaline Phosphatase: 63 U/L (ref 38–126)
Anion gap: 6 (ref 5–15)
BUN: 5 mg/dL — ABNORMAL LOW (ref 6–20)
CO2: 29 mmol/L (ref 22–32)
Calcium: 8.7 mg/dL — ABNORMAL LOW (ref 8.9–10.3)
Chloride: 108 mmol/L (ref 98–111)
Creatinine, Ser: 0.75 mg/dL (ref 0.61–1.24)
GFR, Estimated: >60 mL/min (ref >60)
Glucose, Bld: 77 mg/dL (ref 70–99)
Potassium: 3.4 mmol/L — ABNORMAL LOW (ref 3.5–5.1)
Sodium: 143 mmol/L (ref 135–145)
Total Bilirubin: 0.6 mg/dL (ref 0.3–1.2)
Total Protein: 6 g/dL — ABNORMAL LOW (ref 6.5–8.1)

## 2021-08-18 LAB — MAGNESIUM: Magnesium: 1.4 mg/dL — ABNORMAL LOW (ref 1.7–2.4)

## 2021-08-18 LAB — GLUCOSE, CAPILLARY
Glucose-Capillary: 74 mg/dL (ref 70–99)
Glucose-Capillary: 81 mg/dL (ref 70–99)

## 2021-08-18 LAB — BRAIN NATRIURETIC PEPTIDE: B Natriuretic Peptide: 1104.6 pg/mL — ABNORMAL HIGH (ref 0.0–100.0)

## 2021-08-18 MED ORDER — AMLODIPINE BESYLATE 10 MG PO TABS
10.0000 mg | ORAL_TABLET | Freq: Every day | ORAL | Status: DC
  Administered 2021-08-18: 10 mg via ORAL
  Filled 2021-08-18: qty 1

## 2021-08-18 MED ORDER — MAGNESIUM SULFATE 50 % IJ SOLN
6.0000 g | Freq: Once | INTRAVENOUS | Status: AC
  Administered 2021-08-18: 6 g via INTRAVENOUS
  Filled 2021-08-18: qty 12

## 2021-08-18 MED ORDER — OMEPRAZOLE 40 MG PO CPDR: 40.0000 mg | DELAYED_RELEASE_CAPSULE | Freq: Two times a day (BID) | ORAL | 0 refills | Status: DC

## 2021-08-18 MED ORDER — MORPHINE SULFATE (PF) 2 MG/ML IV SOLN
0.5000 mg | Freq: Once | INTRAVENOUS | Status: AC
  Administered 2021-08-18: 0.5 mg via INTRAVENOUS
  Filled 2021-08-18: qty 1

## 2021-08-18 MED ORDER — POTASSIUM CHLORIDE CRYS ER 20 MEQ PO TBCR
40.0000 meq | EXTENDED_RELEASE_TABLET | Freq: Once | ORAL | Status: AC
  Administered 2021-08-18: 40 meq via ORAL
  Filled 2021-08-18: qty 2

## 2021-08-18 NOTE — TOC Transition Note (Signed)
Transition of Care (TOC) - CM/SW Discharge Note ? ? ?Patient Details  ?Name: [name] ?MRN: [id number] ?Date of Birth: 06-Aug-1970 ? ?Transition of Care (TOC) CM/SW Contact:  ?Tom-Johnson, Renea Ee, RN ?Phone Number: ?08/18/2021, 10:40 AM ? ? ?Clinical Narrative:    ? ?Patient is scheduled for discharge today. No PT f/u noted. Denies any TOC needs. Mother to transport at discharge. No further TOC needs noted.  ? ?Final next level of care: Home/Self Care ?Barriers to Discharge: Barriers Resolved ? ? ?Patient Goals and CMS Choice ?Patient states their goals for this hospitalization and ongoing recovery are:: To return home ?CMS Medicare.gov Compare Post Acute Care list provided to:: Patient ?Choice offered to / list presented to : NA ? ?Discharge Placement ?  ?           ?  ?Patient to be transferred to facility by: Mother ?  ?  ? ?Discharge Plan and Services ?  ?Discharge Planning Services: CM Consult ?Post Acute Care Choice: NA          ?DME Arranged: N/A ?DME Agency: NA ?  ?  ?  ?HH Arranged: NA ?[place] Agency: NA ?  ?  ?  ? ?Social Determinants of Health (SDOH) Interventions ?  ? ? ?Readmission Risk Interventions ? ?  08/18/2021  ? 10:38 AM 06/08/2021  ? 11:22 AM 08/29/2019  ?  3:45 PM  ?Readmission Risk Prevention Plan  ?Transportation Screening Complete Complete Complete  ?Medication Review (RN Care Manager) Complete Referral to Pharmacy Complete  ?PCP or Specialist appointment within 3-5 days of discharge Complete Not Complete Complete  ?PCP/Specialist Appt Not Complete comments  patient not ready to d/c   ?[place] or Home Care Consult Complete Complete Complete  ?SW Recovery Care/Counseling Consult Complete Complete Complete  ?Palliative Care Screening Not Applicable Not Applicable Not Applicable  ?[place] Not Applicable Not Applicable Not Applicable  ? ? ? ? ? ?

## 2021-08-22 IMAGING — DX DG CHEST 2V
2 series · 2 of 2 positions shown · non-contrast
Comparison: 12/25/2018 chest radiograph.

CLINICAL DATA: Chest pain

EXAM:
CHEST - 2 VIEW

[chest pa]
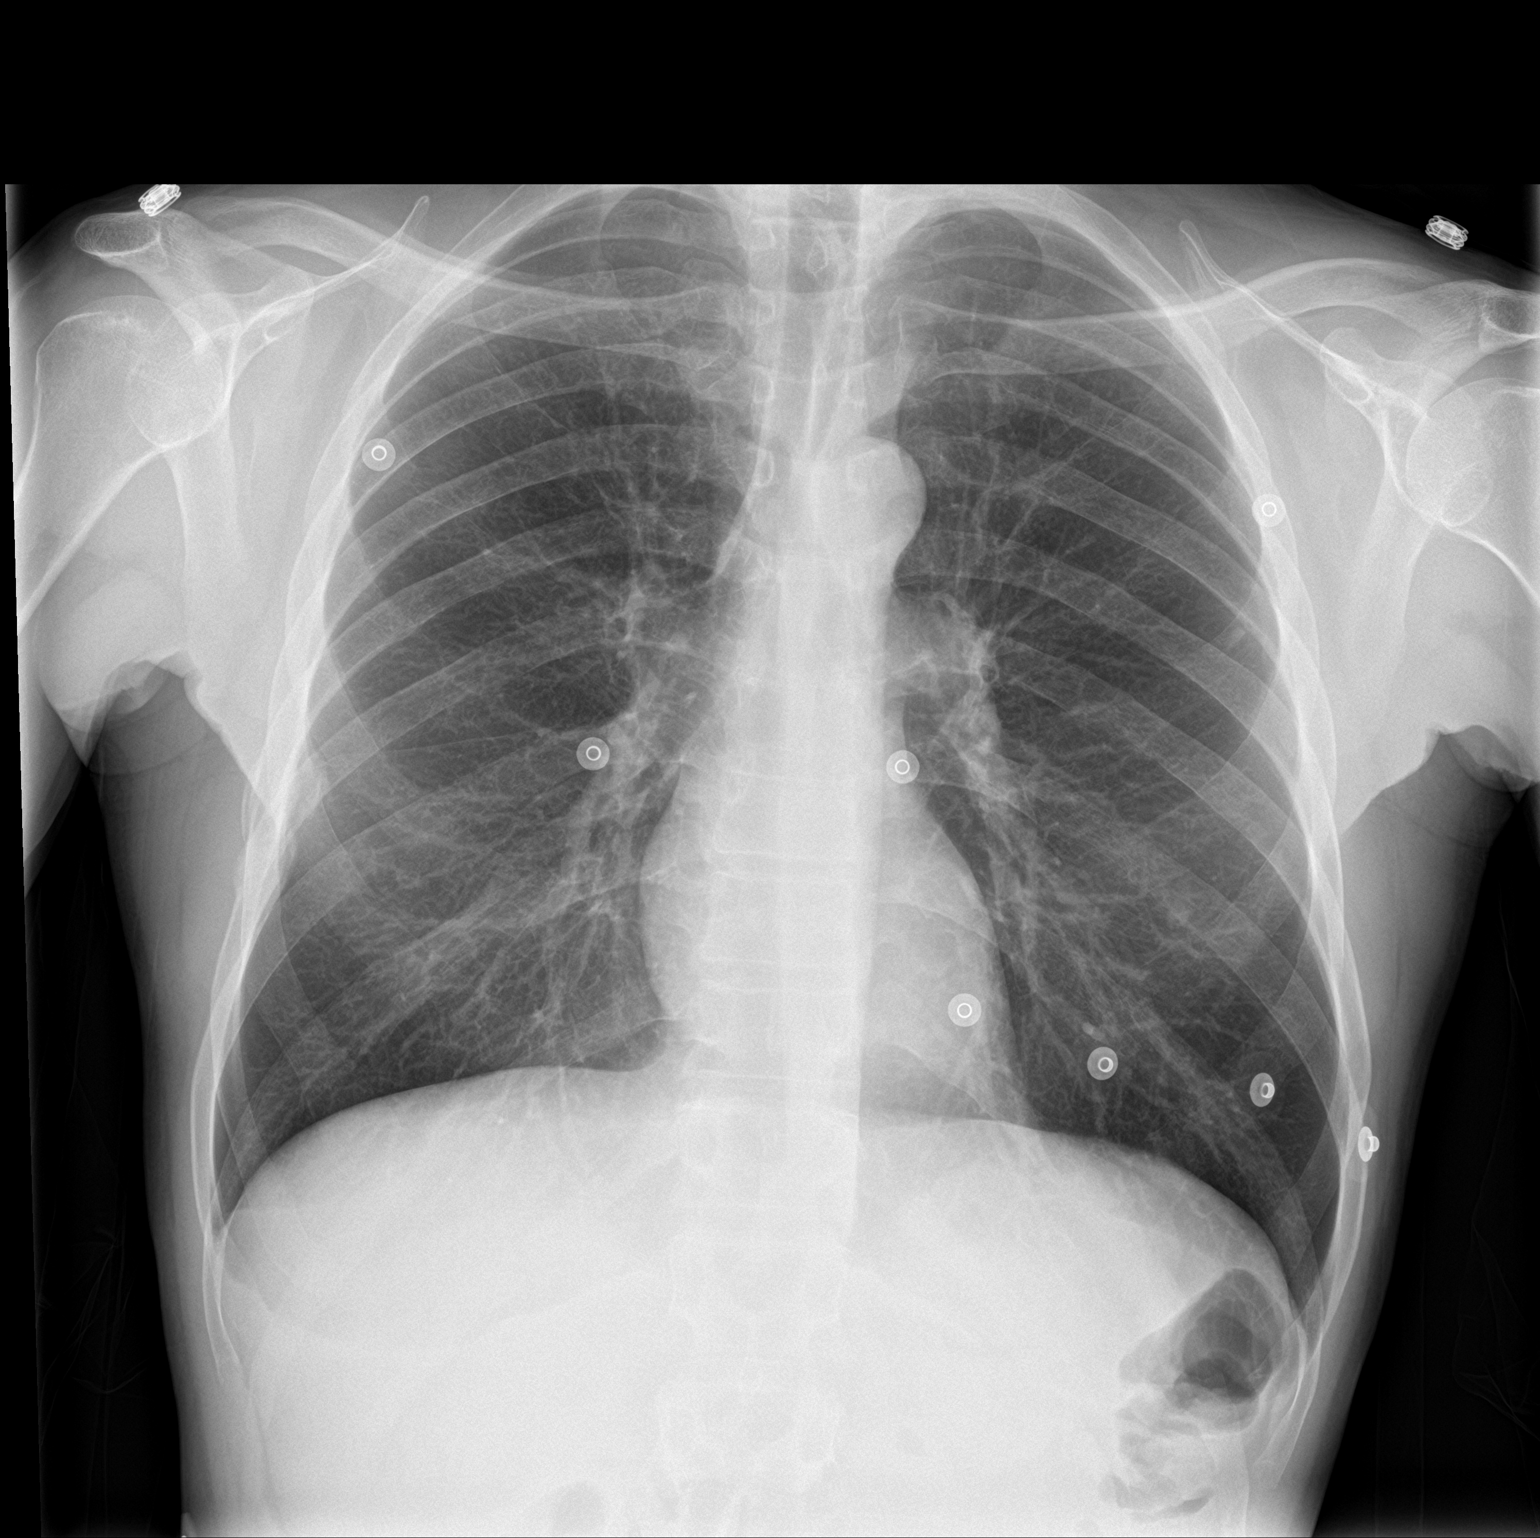

[chest lat]
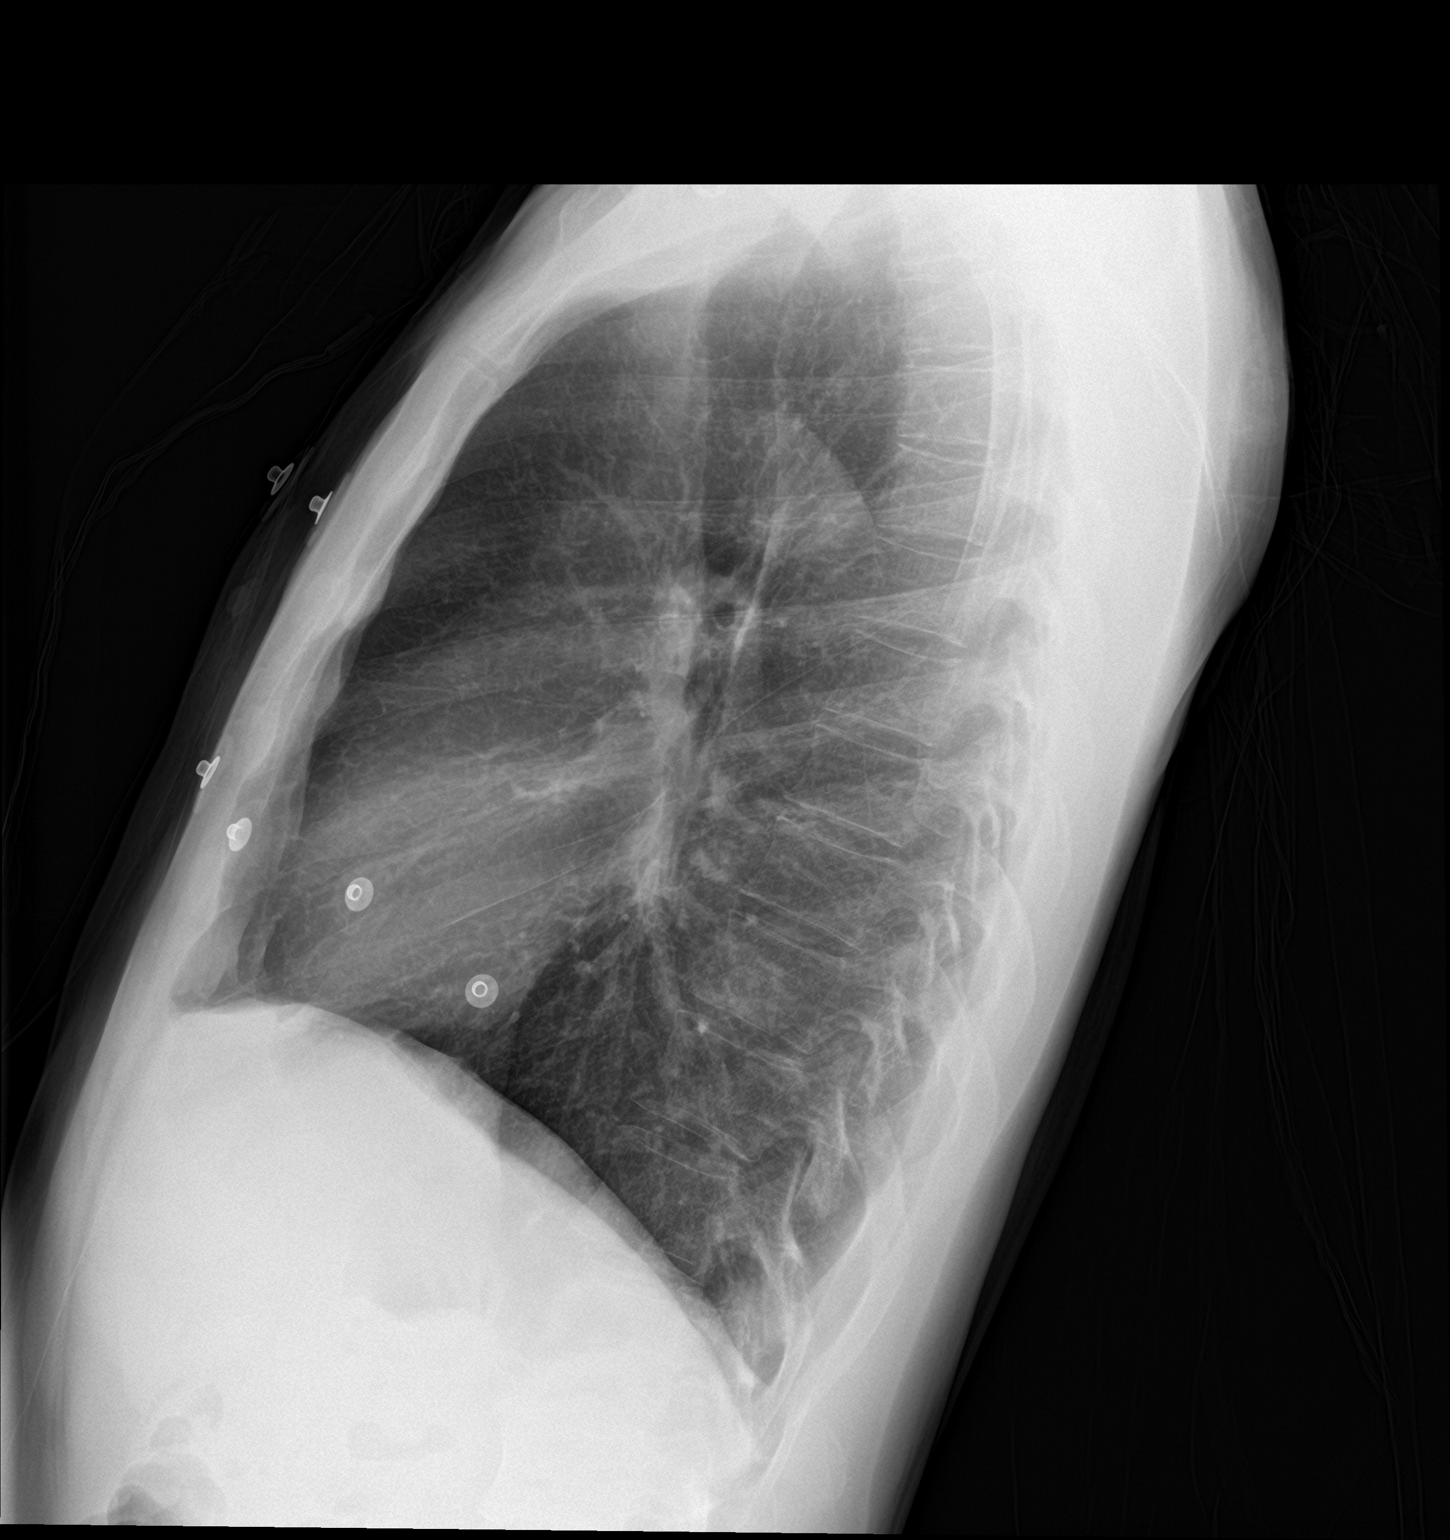

[2 of 2 positions shown; findings below may reference images not displayed]

FINDINGS: Stable cardiomediastinal silhouette with normal heart size. No
pneumothorax. No pleural effusion. Lungs appear clear, with no acute
consolidative airspace disease and no pulmonary edema. Subacute
healing versus chronic fractures in lateral aspect of right seventh
and eighth ribs.
IMPRESSION: 1. No active cardiopulmonary disease.
2. Subacute healing versus chronic fractures in lateral aspect of
right seventh and eighth ribs.

## 2021-09-02 ENCOUNTER — Emergency Department (HOSPITAL_COMMUNITY)
Admission: EM | Admit: 2021-09-02 | Discharge: 2021-09-03 | Disposition: A | Discharge status: Home / Self Care | Payer: Medicaid Other | Attending: Student | Admitting: Student

## 2021-09-02 ENCOUNTER — Emergency Department (HOSPITAL_COMMUNITY): Payer: Medicaid Other

## 2021-09-02 ENCOUNTER — Other Ambulatory Visit: Payer: Self-pay

## 2021-09-02 DIAGNOSIS — J45909 Unspecified asthma, uncomplicated: Secondary | ICD-10-CM | POA: Insufficient documentation

## 2021-09-02 DIAGNOSIS — I10 Essential (primary) hypertension: Secondary | ICD-10-CM | POA: Diagnosis not present

## 2021-09-02 DIAGNOSIS — R079 Chest pain, unspecified: Secondary | ICD-10-CM | POA: Insufficient documentation

## 2021-09-02 DIAGNOSIS — G8929 Other chronic pain: Secondary | ICD-10-CM | POA: Diagnosis not present

## 2021-09-02 DIAGNOSIS — Z91040 Latex allergy status: Secondary | ICD-10-CM | POA: Diagnosis not present

## 2021-09-02 DIAGNOSIS — R1110 Vomiting, unspecified: Secondary | ICD-10-CM | POA: Diagnosis not present

## 2021-09-02 DIAGNOSIS — F17210 Nicotine dependence, cigarettes, uncomplicated: Secondary | ICD-10-CM | POA: Insufficient documentation

## 2021-09-02 DIAGNOSIS — R1013 Epigastric pain: Secondary | ICD-10-CM | POA: Insufficient documentation

## 2021-09-02 DIAGNOSIS — R109 Unspecified abdominal pain: Secondary | ICD-10-CM

## 2021-09-02 LAB — CBC
HCT: 32.3 % — ABNORMAL LOW (ref 39.0–52.0)
Hemoglobin: 10.1 g/dL — ABNORMAL LOW (ref 13.0–17.0)
MCH: 26.9 pg (ref 26.0–34.0)
MCHC: 31.3 g/dL (ref 30.0–36.0)
MCV: 86.1 fL (ref 80.0–100.0)
Platelets: 325 10*3/uL (ref 150–400)
RBC: 3.75 MIL/uL — ABNORMAL LOW (ref 4.22–5.81)
RDW: 19 % — ABNORMAL HIGH (ref 11.5–15.5)
WBC: 7.9 10*3/uL (ref 4.0–10.5)
nRBC: 0 % (ref 0.0–0.2)

## 2021-09-02 LAB — TROPONIN I (HIGH SENSITIVITY)
Troponin I (High Sensitivity): 7 ng/L (ref <18)
Troponin I (High Sensitivity): 9 ng/L (ref <18)

## 2021-09-02 LAB — LIPASE, BLOOD: Lipase: 31 U/L (ref 11–51)

## 2021-09-02 LAB — BASIC METABOLIC PANEL
Anion gap: 7 (ref 5–15)
BUN: 7 mg/dL (ref 6–20)
CO2: 23 mmol/L (ref 22–32)
Calcium: 8.4 mg/dL — ABNORMAL LOW (ref 8.9–10.3)
Chloride: 111 mmol/L (ref 98–111)
Creatinine, Ser: 0.98 mg/dL (ref 0.61–1.24)
GFR, Estimated: >60 mL/min (ref >60)
Glucose, Bld: 98 mg/dL (ref 70–99)
Potassium: 4 mmol/L (ref 3.5–5.1)
Sodium: 141 mmol/L (ref 135–145)

## 2021-09-02 MED ORDER — PROCHLORPERAZINE EDISYLATE 10 MG/2ML IJ SOLN
10.0000 mg | Freq: Once | INTRAMUSCULAR | Status: AC
  Administered 2021-09-02: 10 mg via INTRAVENOUS
  Filled 2021-09-02: qty 2

## 2021-09-02 MED ORDER — LIDOCAINE VISCOUS HCL 2 % MT SOLN
15.0000 mL | Freq: Once | OROMUCOSAL | Status: AC
Start: 2021-09-02 — End: 2021-09-02
  Administered 2021-09-02: 15 mL via ORAL
  Filled 2021-09-02: qty 15

## 2021-09-02 MED ORDER — LORAZEPAM 2 MG/ML IJ SOLN
1.0000 mg | Freq: Once | INTRAMUSCULAR | Status: AC
Start: 2021-09-02 — End: 2021-09-02
  Administered 2021-09-02: 1 mg via INTRAVENOUS
  Filled 2021-09-02: qty 1

## 2021-09-02 MED ORDER — ALUM & MAG HYDROXIDE-SIMETH 200-200-20 MG/5ML PO SUSP
30.0000 mL | Freq: Once | ORAL | Status: AC
Start: 2021-09-02 — End: 2021-09-02
  Administered 2021-09-02: 30 mL via ORAL
  Filled 2021-09-02: qty 30

## 2021-09-02 MED ORDER — MORPHINE SULFATE (PF) 2 MG/ML IV SOLN
2.0000 mg | Freq: Once | INTRAVENOUS | Status: AC
  Administered 2021-09-02: 2 mg via INTRAVENOUS
  Filled 2021-09-02: qty 1

## 2021-09-02 NOTE — ED Triage Notes (Signed)
Pt here via [facility] for chest pain from bus stop. Pt reports pain is sharp stabbing L side pain radiating to neck, started when he was walking to the bus stop. Pt c/o nausea. Pt did not receive ASA due to hx stomach ulcers, did not receive SL nitro due to taking ED medications within last 24 hours. EMS gave total '10mg'$  morphine which brought pain from 10/10 to 6/10. 180/130, 80 HR, 100% RA, 20g R hand. ?

## 2021-09-02 NOTE — ED Provider Triage Note (Signed)
Emergency Medicine Provider Triage Evaluation Note ? ?[name] , a 51 y.o. male  was evaluated in triage.  Pt complains of chest pain, abdominal pain, headache. Hx of WPW, hx of chronic pancreatitis, frequent previous ED visits. Endorses some nausea, without vomiting. Denies shob, fever, chills. '10mg'$  morphine administered PTA. ? ?Review of Systems  ?Positive: Chest pain, abdominal pain, nausea, headache ?Negative: Shob, fever, chills ? ?Physical Exam  ?BP (!) 186/136 (BP Location: Right Arm)   Pulse 88   Temp 98.1 ?F (36.7 ?C) (Oral)   Resp 18   SpO2 99%  ?Gen:   Awake, no distress   ?Resp:  Normal effort  ?MSK:   Moves extremities without difficulty  ?Other:  Ttp epigastric abdominal pain ? ?Medical Decision Making  ?Medically screening exam initiated at 8:05 PM.  Appropriate orders placed.  Other [name] was informed that the remainder of the evaluation will be completed by another provider, this initial triage assessment does not replace that evaluation, and the importance of remaining in the ED until their evaluation is complete. ? ?Workup initiated ?  ?Anselmo Pickler, PA-C ?09/02/21 2007 ? ?

## 2021-09-02 NOTE — ED Notes (Signed)
Pt states that his pain is a burning that started in his stomach this morning and worked its way up into his chest and is accompanied by nausea  ?

## 2021-09-03 NOTE — ED Provider Notes (Signed)
?[facility] ?Provider Note ? ?CSN: [id number] ?Arrival date & time: 09/02/21 1947 ? ?Chief Complaint(s) ?Chest Pain ? ?HPI ?[name] is a 51 y.o. male with PMH chronic abdominal pain, chronic pancreatitis, bipolar disease, polysubstance abuse who presents emergency department for evaluation of chest and abdominal pain.  Patient states that he drank a lot of alcohol last night and has had multiple episodes of nonbloody nonbilious emesis today.  He endorses epigastric abdominal pain that radiates up into the chest but no associated shortness of breath, diaphoresis or exertional symptoms.  He states that it feels like his pancreatitis.  Denies diarrhea, headache, fever or other systemic symptoms. ? ? ?Past Medical History ?Past Medical History:  ?Diagnosis Date  ? Alcoholism /alcohol abuse   ? Anemia   ? Anxiety   ? Arthritis   ? "knees; arms; elbows" (03/26/2015)  ? Asthma   ? Bipolar disorder ([place])   ? Chronic bronchitis ([place])   ? Chronic lower back pain   ? Chronic pancreatitis ([place])   ? Cocaine abuse ([place])   ? Depression   ? Family history of adverse reaction to anesthesia   ? "grandmother gets confused"  ? Femoral condyle fracture ([place]) 03/08/2014  ? left medial/notes 03/09/2014  ? GERD (gastroesophageal reflux disease)   ? H/O hiatal hernia   ? H/O suicide attempt 10/2012  ? High cholesterol   ? History of blood transfusion 10/2012  ? "when I tried to commit suicide"  ? History of stomach ulcers   ? Hypertension   ? Marijuana abuse, continuous   ? Migraine   ? "a few times/year" (03/26/2015)  ? Pneumonia 1990's X 3  ? PTSD (post-traumatic stress disorder)   ? Sickle cell trait ([place])   ? WPW (Wolff-Parkinson-White syndrome)   ? [name] 03/06/2013  ? ?Patient Active Problem List  ? Diagnosis Date Noted  ? Leukocytosis 06/07/2021  ? Epigastric pain 06/07/2021  ? Acute alcoholic pancreatitis 16/60/6301  ? Urinary retention   ? Protein-calorie malnutrition, severe  11/17/2020  ? Acute pancreatitis 11/15/2020  ? Hypomagnesemia   ? AKI (acute kidney injury) ([place]) 11/13/2018  ? Seizure ([place]) 11/13/2018  ? Acute on chronic pancreatitis ([place]) 09/28/2017  ? Abdominal pain 05/27/2017  ? Nausea & vomiting 03/18/2017  ? Normocytic anemia 12/05/2016  ? Alcohol use disorder, severe, dependence ([place]) 07/25/2016  ? Cocaine use disorder, severe, dependence ([place]) 07/25/2016  ? 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 Chronic pancreatitis ([place]) 05/18/2015  ? Essential hypertension 02/06/2014  ? Mood disorder ([place]) 02/06/2014  ? Pancreatic pseudocyst/cyst 11/25/2013  ? [name] Parkinson White pattern seen on electrocardiogram 10/03/2012  ? Tobacco abuse 03/23/2007  ? ?Home Medication(s) ?Prior to Admission medications   ?Medication Sig Start Date End Date Taking? Authorizing Provider  ?acetaminophen (TYLENOL) 500 MG tablet Take 500 mg by mouth every 6 (six) hours as needed for moderate pain or headache.    [provider]  ?albuterol (VENTOLIN HFA) 108 (90 Base) MCG/ACT inhaler Inhale 2 puffs into the lungs every 4 (four) hours as needed for wheezing or shortness of breath. 08/17/21   [name], MD  ?cholecalciferol (VITAMIN D) 25 MCG tablet Take 1 tablet (1,000 Units total) by mouth daily. ?Patient not taking: Reported on 08/15/2021 10/30/20   [name], MD  ?folic acid (FOLVITE) 1 MG tablet Take 1 tablet (1 mg total) by mouth daily. 11/26/17   [name], MD  ?gabapentin (NEURONTIN) 300  MG capsule Take 300 mg by mouth 3 (three) times daily as needed (for neuropathic pain).    [provider]  ?lidocaine (XYLOCAINE) 2 % solution Use as directed 15 mLs in the mouth or throat every 6 (six) hours as needed for mouth pain. 11/21/20   [name], [name], DO  ?lipase/protease/amylase (CREON) 36000 UNITS CPEP capsule Take 2 capsules (72,000 Units total) by mouth 3 (three) times daily with meals. For pancreatitis 11/05/19    [name], [name], MD  ?magnesium oxide (MAG-OX) 400 (240 Mg) MG tablet Take 400 mg by mouth daily. 12/24/20   [provider]  ?methocarbamol (ROBAXIN) 750 MG tablet Take 750 mg by mouth every 6 (six) hours as needed for muscle spasms. 05/17/21   [provider]  ?metoprolol (TOPROL-XL) 200 MG 24 hr tablet Take 100 mg by mouth daily. 10/29/20   [provider]  ?Multiple Vitamin (MULTIVITAMIN WITH MINERALS) TABS tablet Take 1 tablet by mouth daily. 09/06/17   Bonnell Public, MD  ?omeprazole (PRILOSEC) 40 MG capsule Take 1 capsule (40 mg total) by mouth 2 (two) times daily before a meal. 08/18/21   [name], MD  ?ondansetron (ZOFRAN-ODT) 4 MG disintegrating tablet Take 1 tablet (4 mg total) by mouth every 8 (eight) hours as needed for vomiting or nausea. 08/17/21   [name], MD  ?oxyCODONE-acetaminophen (PERCOCET/ROXICET) 5-325 MG tablet Take 1 tablet by mouth every 6 (six) hours as needed for moderate pain. ?Patient not taking: Reported on 08/15/2021 05/17/21   [provider]  ?Polyethyl Glycol-Propyl Glycol (SYSTANE OP) Place 1 drop into both eyes 4 (four) times daily as needed (for dryness).    [provider]  ?potassium chloride (KLOR-CON) 10 MEQ tablet Take 10 mEq by mouth daily.    [provider]  ?sucralfate (CARAFATE) 1 GM/10ML suspension Take 10 MLs BY MOUTH FOUR TIMES DAILY with meals AND AT BEDTIME ?Patient taking differently: Take 1 g by mouth 4 (four) times daily -  with meals and at bedtime. 06/01/21   [name], [name], MD  ?thiamine 100 MG tablet Take 1 tablet (100 mg total) by mouth daily. 10/16/18   [name], MD  ?verapamil (CALAN-SR) 120 MG CR tablet Take 120 mg by mouth at bedtime.    [provider]  ?Vitamin D, Ergocalciferol, (DRISDOL) 1.25 MG (50000 UNIT) CAPS capsule Take 50,000 Units by mouth every Wednesday.    [provider]  ?amitriptyline (ELAVIL) 25 MG tablet Take 1 tablet (25 mg total) by  mouth at bedtime. ?Patient not taking: Reported on 08/08/2019 10/15/18 08/08/19  [name], MD  ?promethazine (PHENERGAN) 25 MG tablet Take 1 tablet (25 mg total) by mouth every 6 (six) hours as needed for nausea or vomiting. ?Patient not taking: Reported on 08/08/2019 03/02/19 08/08/19  Kinnie Feil, PA-C  ?                                                                                                                                  ?  Past Surgical History ?Past Surgical History:  ?Procedure Laterality Date  ? BIOPSY  11/25/2017  ? Procedure: BIOPSY;  Surgeon: Arta Silence, MD;  Location: [facility];  Service: Endoscopy;;  ? BIOPSY  10/14/2018  ? Procedure: BIOPSY;  Surgeon: Arta Silence, MD;  Location: [facility];  Service: Endoscopy;;  ? CARDIAC CATHETERIZATION    ? CYST ENTEROSTOMY  01/02/2020  ? Procedure: CYST ASPIRATION;  Surgeon: Milus Banister, MD;  Location: [facility];  Service: Endoscopy;;  ? ESOPHAGOGASTRODUODENOSCOPY (EGD) WITH PROPOFOL N/A 11/25/2017  ? Procedure: ESOPHAGOGASTRODUODENOSCOPY (EGD) WITH PROPOFOL;  Surgeon: Arta Silence, MD;  Location: [facility] ENDOSCOPY;  Service: Endoscopy;  Laterality: N/A;  ? ESOPHAGOGASTRODUODENOSCOPY (EGD) WITH PROPOFOL Left 10/14/2018  ? Procedure: ESOPHAGOGASTRODUODENOSCOPY (EGD) WITH PROPOFOL;  Surgeon: Arta Silence, MD;  Location: [facility] ENDOSCOPY;  Service: Endoscopy;  Laterality: Left;  ? ESOPHAGOGASTRODUODENOSCOPY (EGD) WITH PROPOFOL N/A 11/14/2018  ? Procedure: ESOPHAGOGASTRODUODENOSCOPY (EGD) WITH PROPOFOL;  Surgeon: Laurence Spates, MD;  Location: [facility];  Service: Gastroenterology;  Laterality: N/A;  ? ESOPHAGOGASTRODUODENOSCOPY (EGD) WITH PROPOFOL N/A 01/02/2020  ? Procedure: ESOPHAGOGASTRODUODENOSCOPY (EGD) WITH PROPOFOL;  Surgeon: Milus Banister, MD;  Location: [facility];  Service: Endoscopy;  Laterality: N/A;  ? ESOPHAGOGASTRODUODENOSCOPY (EGD) WITH PROPOFOL N/A 10/25/2020  ? Procedure: ESOPHAGOGASTRODUODENOSCOPY (EGD) WITH PROPOFOL;   Surgeon: Rush Landmark Telford Nab., MD;  Location: [facility];  Service: Gastroenterology;  Laterality: N/A;  ? EUS N/A 01/02/2020  ? Procedure: UPPER ENDOSCOPIC ULTRASOUND (EUS) RADIAL;  Surgeon: Owens Loffler

## 2021-10-11 ENCOUNTER — Other Ambulatory Visit: Payer: Self-pay | Admitting: Gastroenterology

## 2021-10-12 ENCOUNTER — Other Ambulatory Visit: Payer: Self-pay

## 2021-10-12 ENCOUNTER — Telehealth: Payer: Self-pay | Admitting: Nurse Practitioner

## 2021-10-12 DIAGNOSIS — Z8711 Personal history of peptic ulcer disease: Secondary | ICD-10-CM

## 2021-10-12 DIAGNOSIS — K860 Alcohol-induced chronic pancreatitis: Secondary | ICD-10-CM

## 2021-10-12 MED ORDER — PANCRELIPASE (LIP-PROT-AMYL) 36000-114000 UNITS PO CPEP: 72000.0000 [IU] | ORAL_CAPSULE | Freq: Three times a day (TID) | ORAL | 5 refills | Status: DC

## 2021-10-12 MED ORDER — SUCRALFATE 1 G PO TABS: 1.0000 g | ORAL_TABLET | Freq: Three times a day (TID) | ORAL | 1 refills | Status: DC

## 2021-10-12 NOTE — Telephone Encounter (Signed)
Spoke with pt and he stated he is going to [facility] recovery rehab in [place] today and he needs a refill on his creon. Asked pt if he knew if the rehab provides the medications or if he is supposed to bring his own and pt was unsure. Called [facility] to see if pt is supposed to bring his home medications or if he will receive his prescribed medications at the facility. [facility] stated that pt needs to bring home medications. Called [employer] to see when assistance expired or if it is still active. They stated assistance expired 09/18/20 and pt would need to submit a new application along with proof of income. Called pt to see which pharmacy had told him it would cost $300 and pt was unsure. [facility] and they stated they didn't have a creon prescription for him then called my [facility] in [place] and they stated they didn't tell him it would cost $300 because they didn't have a creon prescription for him. [facility]ist at [facility] also requested that carafate be resent in tablet form because it would be more affordable for the pt. Pharmacist also said they do not have a copy of pt's insurance card. Unable to find up to date medicaid card on computer. Called pt to see what insurance he has and he stated he has a new medicaid card. Let pt know that he needs to bring updated medicaid card when he picks up carafate and creon and the pharmacy can tell him the price with his insurance. Advised pt to call us back if cost of medication is still too much.

## 2021-10-12 NOTE — Telephone Encounter (Addendum)
Spoke with pharmacist at [facility] and he stated that pt is not currently signed up for medicaid and that pt may be eligible but according to [facility] website he is not currently signed up.  Right now cost of creon is over $2,000 for pt.

## 2021-10-12 NOTE — Telephone Encounter (Signed)
Inbound call from patient stating that he was going into rehab today to help him with his Alcohol problem. Patient stated that he was in need of a refill of Creon and he was not able to get it at the pharmacy because it cost so much. Patient stated he believed that he would need all his medications or else he would not be able to go to the rehab program. Patient is seeking advice if there is anyway he can get the medication without having to pay 300 dollars. Please advise.

## 2021-10-12 NOTE — Telephone Encounter (Signed)
Patient called call back requesting to follow up with a nurse regarding his Creon medication.

## 2021-10-12 NOTE — Telephone Encounter (Signed)
Spoke with pt and let him know that we have samples available for pick up and he could pick up [facility] assist pt assistance paperwork when he picks up creon. Pt stated he's not sure if he can come by the office today because the rehab facility told him they were ready for him. Let pt know he can come by whenever he gets out.

## 2021-10-14 ENCOUNTER — Telehealth: Payer: Self-pay | Admitting: Nurse Practitioner

## 2021-10-14 NOTE — Telephone Encounter (Signed)
Spoke with Nurse at [facility] who stated that pt will be there for 7 days if he decides to stay the whole time. Nurse stated that pt brought in several medications but most were expired so they are unable to give them to him. Let nurse know that pt assistance for Creon expired over a year ago for pt. Told nurse we have the pt assistance application and an 8 day supply at the office that pt can pick up whenever he is available. Nurse stated she would let pt and doctor at facility know.

## 2021-10-14 NOTE — Telephone Encounter (Signed)
Patient's mother called stating patient was now at [facility] and he has to provide his own medicine.  She said he had to have his own medication and he needs to have his Creon sent there.  Their phone number is [phone].

## 2021-11-02 ENCOUNTER — Emergency Department (HOSPITAL_COMMUNITY)
Admission: EM | Admit: 2021-11-02 | Discharge: 2021-11-03 | Disposition: A | Discharge status: Home / Self Care | Payer: Medicaid Other | Attending: Emergency Medicine | Admitting: Emergency Medicine

## 2021-11-02 ENCOUNTER — Other Ambulatory Visit: Payer: Self-pay

## 2021-11-02 ENCOUNTER — Encounter (HOSPITAL_COMMUNITY): Payer: Self-pay

## 2021-11-02 ENCOUNTER — Emergency Department (HOSPITAL_COMMUNITY): Payer: Medicaid Other

## 2021-11-02 DIAGNOSIS — Z79899 Other long term (current) drug therapy: Secondary | ICD-10-CM | POA: Diagnosis not present

## 2021-11-02 DIAGNOSIS — F17200 Nicotine dependence, unspecified, uncomplicated: Secondary | ICD-10-CM | POA: Diagnosis not present

## 2021-11-02 DIAGNOSIS — Z91040 Latex allergy status: Secondary | ICD-10-CM | POA: Diagnosis not present

## 2021-11-02 DIAGNOSIS — K860 Alcohol-induced chronic pancreatitis: Secondary | ICD-10-CM | POA: Diagnosis not present

## 2021-11-02 DIAGNOSIS — R109 Unspecified abdominal pain: Secondary | ICD-10-CM | POA: Diagnosis present

## 2021-11-02 LAB — CBC
HCT: 31.5 % — ABNORMAL LOW (ref 39.0–52.0)
Hemoglobin: 9.4 g/dL — ABNORMAL LOW (ref 13.0–17.0)
MCH: 25.5 pg — ABNORMAL LOW (ref 26.0–34.0)
MCHC: 29.8 g/dL — ABNORMAL LOW (ref 30.0–36.0)
MCV: 85.4 fL (ref 80.0–100.0)
Platelets: 188 10*3/uL (ref 150–400)
RBC: 3.69 MIL/uL — ABNORMAL LOW (ref 4.22–5.81)
RDW: 21.6 % — ABNORMAL HIGH (ref 11.5–15.5)
WBC: 6.4 10*3/uL (ref 4.0–10.5)
nRBC: 0 % (ref 0.0–0.2)

## 2021-11-02 LAB — BASIC METABOLIC PANEL
Anion gap: 7 (ref 5–15)
BUN: 5 mg/dL — ABNORMAL LOW (ref 6–20)
CO2: 17 mmol/L — ABNORMAL LOW (ref 22–32)
Calcium: 8.3 mg/dL — ABNORMAL LOW (ref 8.9–10.3)
Chloride: 114 mmol/L — ABNORMAL HIGH (ref 98–111)
Creatinine, Ser: 0.89 mg/dL (ref 0.61–1.24)
GFR, Estimated: >60 mL/min (ref >60)
Glucose, Bld: 177 mg/dL — ABNORMAL HIGH (ref 70–99)
Potassium: 4 mmol/L (ref 3.5–5.1)
Sodium: 138 mmol/L (ref 135–145)

## 2021-11-02 LAB — TROPONIN I (HIGH SENSITIVITY)
Troponin I (High Sensitivity): 5 ng/L (ref <18)
Troponin I (High Sensitivity): 5 ng/L (ref <18)

## 2021-11-02 LAB — MAGNESIUM: Magnesium: 1.2 mg/dL — ABNORMAL LOW (ref 1.7–2.4)

## 2021-11-02 LAB — RAPID URINE DRUG SCREEN, HOSP PERFORMED
Amphetamines: NOT DETECTED
Barbiturates: NOT DETECTED
Benzodiazepines: NOT DETECTED
Cocaine: POSITIVE — AB
Opiates: NOT DETECTED
Tetrahydrocannabinol: NOT DETECTED

## 2021-11-02 LAB — POC OCCULT BLOOD, ED: Fecal Occult Bld: NEGATIVE

## 2021-11-02 LAB — LIPASE, BLOOD: Lipase: 23 U/L (ref 11–51)

## 2021-11-02 MED ORDER — ALUM & MAG HYDROXIDE-SIMETH 200-200-20 MG/5ML PO SUSP
30.0000 mL | Freq: Once | ORAL | Status: AC
Start: 2021-11-02 — End: 2021-11-02
  Administered 2021-11-02: 30 mL via ORAL
  Filled 2021-11-02: qty 30

## 2021-11-02 MED ORDER — LACTATED RINGERS IV BOLUS
500.0000 mL | Freq: Once | INTRAVENOUS | Status: AC
  Administered 2021-11-02: 500 mL via INTRAVENOUS

## 2021-11-02 MED ORDER — MAGNESIUM SULFATE 2 GM/50ML IV SOLN
2.0000 g | Freq: Once | INTRAVENOUS | Status: AC
  Administered 2021-11-02: 2 g via INTRAVENOUS
  Filled 2021-11-02: qty 50

## 2021-11-02 MED ORDER — OXYCODONE HCL 5 MG PO TABS
5.0000 mg | ORAL_TABLET | Freq: Once | ORAL | Status: AC
  Administered 2021-11-02: 5 mg via ORAL
  Filled 2021-11-02: qty 1

## 2021-11-02 MED ORDER — ONDANSETRON 4 MG PO TBDP
4.0000 mg | ORAL_TABLET | Freq: Once | ORAL | Status: AC
  Administered 2021-11-02: 4 mg via ORAL
  Filled 2021-11-02: qty 1

## 2021-11-02 NOTE — ED Provider Notes (Signed)
[facility] EMERGENCY DEPARTMENT Provider Note   CSN: [id number] Arrival date & time: 11/02/21  1604     History No chief complaint on file.   [name] is a 51 y.o. male daily smoker with history of alcohol use disorder, bipolar, chronic pancreatitis, polysubstance use, WPW, GERD presents the emergency department for evaluation of of burning abdominal pain that radiates into his chest since early this morning.  Patient reports has been compliant with his Creon, omeprazole, and other daily medications.  He denies any drug use.  He reports he has not had any alcohol in 3 days as he has been taking care of his grandson.  Otherwise, he reports he usually drinks around a sixpack a day.  He reports he has had 3 episodes of nonbilious, nonbloody vomiting with some nausea.  Reports 1 episode of nonbloody, nonmelanotic diarrhea today.  HPI     Home Medications Prior to Admission medications   Medication Sig Start Date End Date Taking? Authorizing Provider  acetaminophen (TYLENOL) 500 MG tablet Take 500 mg by mouth every 6 (six) hours as needed for moderate pain or headache.    [provider]  albuterol (VENTOLIN HFA) 108 (90 Base) MCG/ACT inhaler Inhale 2 puffs into the lungs every 4 (four) hours as needed for wheezing or shortness of breath. 08/17/21   [name], MD  cholecalciferol (VITAMIN D) 25 MCG tablet Take 1 tablet (1,000 Units total) by mouth daily. Patient not taking: Reported on 08/15/2021 10/30/20   [name], MD  folic acid (FOLVITE) 1 MG tablet Take 1 tablet (1 mg total) by mouth daily. 11/26/17   [name], MD  gabapentin (NEURONTIN) 300 MG capsule Take 300 mg by mouth 3 (three) times daily as needed (for neuropathic pain).    [provider]  lidocaine (XYLOCAINE) 2 % solution Use as directed 15 mLs in the mouth or throat every 6 (six) hours as needed for mouth pain. 11/21/20   [name], [name], DO   lipase/protease/amylase (CREON) 36000 UNITS CPEP capsule Take 2 capsules (72,000 Units total) by mouth 3 (three) times daily with meals. 10/12/21   Willia Craze, NP  magnesium oxide (MAG-OX) 400 (240 Mg) MG tablet Take 400 mg by mouth daily. 12/24/20   [provider]  methocarbamol (ROBAXIN) 750 MG tablet Take 750 mg by mouth every 6 (six) hours as needed for muscle spasms. 05/17/21   [provider]  metoprolol (TOPROL-XL) 200 MG 24 hr tablet Take 100 mg by mouth daily. 10/29/20   [provider]  Multiple Vitamin (MULTIVITAMIN WITH MINERALS) TABS tablet Take 1 tablet by mouth daily. 09/06/17   Bonnell Public, MD  omeprazole (PRILOSEC) 40 MG capsule Take 1 capsule (40 mg total) by mouth 2 (two) times daily before a meal. 08/18/21   [name], MD  ondansetron (ZOFRAN-ODT) 4 MG disintegrating tablet Take 1 tablet (4 mg total) by mouth every 8 (eight) hours as needed for vomiting or nausea. 08/17/21   [name], MD  oxyCODONE-acetaminophen (PERCOCET/ROXICET) 5-325 MG tablet Take 1 tablet by mouth every 6 (six) hours as needed for moderate pain. Patient not taking: Reported on 08/15/2021 05/17/21   [provider]  Polyethyl Glycol-Propyl Glycol (SYSTANE OP) Place 1 drop into both eyes 4 (four) times daily as needed (for dryness).    [provider]  potassium chloride (KLOR-CON) 10 MEQ tablet Take 10 mEq by mouth daily.    [provider]  sucralfate (CARAFATE) 1 g tablet Take 1 tablet (1 g total) by mouth 4 (four) times daily -  with meals and at bedtime. 10/12/21   Willia Craze, NP  thiamine 100 MG tablet Take 1 tablet (100 mg total) by mouth daily. 10/16/18   [name], MD  verapamil (CALAN-SR) 120 MG CR tablet Take 120 mg by mouth at bedtime.    [provider]  Vitamin D, Ergocalciferol, (DRISDOL) 1.25 MG (50000 UNIT) CAPS capsule Take 50,000 Units by mouth every Wednesday.    [provider]   amitriptyline (ELAVIL) 25 MG tablet Take 1 tablet (25 mg total) by mouth at bedtime. Patient not taking: Reported on 08/08/2019 10/15/18 08/08/19  [name], MD  promethazine (PHENERGAN) 25 MG tablet Take 1 tablet (25 mg total) by mouth every 6 (six) hours as needed for nausea or vomiting. Patient not taking: Reported on 08/08/2019 03/02/19 08/08/19  Kinnie Feil, PA-C      Allergies    Robaxin [methocarbamol], Aspirin, Shellfish-derived products, Trazodone and nefazodone, Adhesive [tape], Latex, Toradol [ketorolac tromethamine], Contrast media [iodinated contrast media], Reglan [metoclopramide], and Salmon [fish oil]    Review of Systems   Review of Systems  Constitutional:  Negative for chills and fever.  Respiratory:  Negative for shortness of breath.   Cardiovascular:  Positive for chest pain. Negative for palpitations.  Gastrointestinal:  Positive for abdominal pain, diarrhea, nausea and vomiting. Negative for blood in stool.  Genitourinary:  Negative for dysuria and hematuria.  Neurological:  Negative for weakness and numbness.    Physical Exam Updated Vital Signs BP (!) 169/93 (BP Location: Right Arm)   Pulse 69   Temp 99 F (37.2 C) (Oral)   Resp 17   Ht '5\' 8"'$  (1.727 m)   Wt 61.7 kg   SpO2 100%   BMI 20.68 kg/m  Physical Exam Vitals and nursing note reviewed.  Constitutional:      General: He is not in acute distress.    Appearance: Normal appearance. He is not ill-appearing or toxic-appearing.  HENT:     Head: Normocephalic and atraumatic.     Mouth/Throat:     Mouth: Mucous membranes are moist.  Eyes:     General: No scleral icterus. Cardiovascular:     Rate and Rhythm: Normal rate and regular rhythm.  Pulmonary:     Effort: Pulmonary effort is normal. No respiratory distress.     Breath sounds: Normal breath sounds.  Abdominal:     General: Bowel sounds are normal. There is no distension.     Palpations: Abdomen is soft.     Tenderness: There is  abdominal tenderness. There is no guarding or rebound.     Comments: Epigastric tenderness without guarding or rebound  Musculoskeletal:        General: No deformity.     Cervical back: Normal range of motion.  Skin:    General: Skin is warm and dry.  Neurological:     General: No focal deficit present.     Mental Status: He is alert. Mental status is at baseline.     ED Results / Procedures / Treatments   Labs (all labs ordered are listed, but only abnormal results are displayed) Labs Reviewed  BASIC METABOLIC PANEL - Abnormal; Notable for the following components:      Result Value   Chloride 114 (*)    CO2 17 (*)    Glucose, Bld 177 (*)    BUN 5 (*)  Calcium 8.3 (*)    All other components within normal limits  CBC - Abnormal; Notable for the following components:   RBC 3.69 (*)    Hemoglobin 9.4 (*)    HCT 31.5 (*)    MCH 25.5 (*)    MCHC 29.8 (*)    RDW 21.6 (*)    All other components within normal limits  RAPID URINE DRUG SCREEN, HOSP PERFORMED - Abnormal; Notable for the following components:   Cocaine POSITIVE (*)    All other components within normal limits  MAGNESIUM - Abnormal; Notable for the following components:   Magnesium 1.2 (*)    All other components within normal limits  LIPASE, BLOOD  POC OCCULT BLOOD, ED  TROPONIN I (HIGH SENSITIVITY)  TROPONIN I (HIGH SENSITIVITY)    EKG EKG Interpretation  Date/Time:  Tuesday November 02 2021 16:10:41 EDT Ventricular Rate:  90 PR Interval:  102 QRS Duration: 66 QT Interval:  330 QTC Calculation: 403 R Axis:   67 Text Interpretation: Sinus rhythm with short PR Possible Left atrial enlargement Borderline ECG When compared with ECG of 02-Sep-2021 19:55, PREVIOUS ECG IS PRESENT Confirmed by Regan Lemming ([id number]) on 11/02/2021 4:17:53 PM  Radiology CT ABDOMEN PELVIS WO CONTRAST  Result Date: 11/02/2021 CLINICAL DATA:  Epigastric pain. EXAM: CT ABDOMEN AND PELVIS WITHOUT CONTRAST TECHNIQUE: Multidetector CT  imaging of the abdomen and pelvis was performed following the standard protocol without IV contrast. RADIATION DOSE REDUCTION: This exam was performed according to the departmental dose-optimization program which includes automated exposure control, adjustment of the mA and/or kV according to patient size and/or use of iterative reconstruction technique. COMPARISON:  Numerous prior CTs are on file dating back to 2010, but the 2 most recent are CTs without contrast 08/14/2021 and 06/06/2021. FINDINGS: Lower chest: No acute abnormality.  The cardiac size is normal. Hepatobiliary: There is no visible focal liver abnormality without contrast. Gallbladder and bile ducts are unremarkable. Pancreas: There are diffuse coarse chronic pancreatic calcifications in the head, uncinate process, neck and proximal body segments consistent with chronic pancreatitis. There is trace stranding anterior to the pancreatic head which may suggest acute on chronic pancreatitis or fat scarring from prior pancreatitis as it seems to have been present on the last CT. The remaining pancreas does not show adjacent stranding. A 2.2 cm cystic lesion in the proximal pancreatic tail is noted and unchanged as well as a stable 1.4 cm cystic lesion in the tip of the tail both probably representing pseudocysts and have been described on multiple prior reports including MRI abdomen 11/22/2019. No solid pancreatic masses seen without contrast. There is no ductal dilatation. Spleen: Unremarkable without contrast. Adrenals/Urinary Tract: No adrenal mass or focal renal cortical abnormality is seen without contrast. There is no urinary stone or obstruction. No bladder thickening or intravesical stone. Stomach/Bowel: Diffuse gastric fold thickening proximal to midportion is again shown circumferentially. This was noted on prior studies. There are fluid-filled normal caliber lower abdominal small bowel segments which may be seen with nonspecific enteritis. The  retrocecal appendix is normal caliber. There are ovoid opaque structures in the distal small bowel and ascending and descending colon which are most likely undigested medication. There is moderate fecal stasis without evidence of bowel obstruction or inflammatory change. No colonic thickening is seen. Scattered diverticulosis left-sided colon. Vascular/Lymphatic: Aortic atherosclerosis. No enlarged abdominal or pelvic lymph nodes. Reproductive: Prostate is unremarkable. Both testicles are in the scrotal sac but only partially visualized. Other: There is a tiny umbilical  fat hernia. There are small inguinal fat hernias. There is no incarcerated hernia. No free air, fluid or hemorrhage is seen. Musculoskeletal: Advanced degenerative disc disease L5-S1. No acute or other significant osseous findings. IMPRESSION: 1. Coarse calcifications consistent with chronic calcific pancreatitis. Questionable acute on chronic pancreatitis with trace stranding at the anterior head of the pancreas versus fat scarring from old pancreatitis. 2. 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 3. Gastroenteritis, constipation, diverticulosis and likely undigested medication in the small and large bowel. 4. Aortic atherosclerosis. Electronically Signed   By: Telford Nab M.D.   On: 11/02/2021 23:53   DG Chest 2 View  Result Date: 11/02/2021 CLINICAL DATA:  Chest pain. EXAM: CHEST - 2 VIEW COMPARISON:  Chest radiograph September 02, 2021. FINDINGS: No consolidation. Small left pleural effusion. No visible pneumothorax. Cardiomediastinal silhouette is within normal limits. No displaced fracture. IMPRESSION: Small left pleural effusion. Otherwise, no acute cardiopulmonary disease. Electronically Signed   By: Margaretha Sheffield M.D.   On: 11/02/2021 16:50    Procedures Procedures   Medications Ordered in ED Medications  magnesium sulfate IVPB 2 g 50 mL (2 g Intravenous New Bag/Given 11/02/21 2253)  alum & mag  hydroxide-simeth (MAALOX/MYLANTA) 200-200-20 MG/5ML suspension 30 mL (30 mLs Oral Given 11/02/21 2152)  oxyCODONE (Oxy IR/ROXICODONE) immediate release tablet 5 mg (5 mg Oral Given 11/02/21 2249)  ondansetron (ZOFRAN-ODT) disintegrating tablet 4 mg (4 mg Oral Given 11/02/21 2248)  lactated ringers bolus 500 mL (500 mLs Intravenous New Bag/Given 11/02/21 2252)    ED Course/ Medical Decision Making/ A&P                           Medical Decision Making Amount and/or Complexity of Data Reviewed Labs: ordered. Radiology: ordered.  Risk OTC drugs. Prescription drug management.   51 y/o M presents to the ED for evaluation of burning abdominal pain radiating to his chest.  Differential diagnosis includes but is not limited to ACS, GERD, PUD, colitis, diverticulitis, pancreatitis, chronic abdominal pain.  Vital signs show elevated blood pressure otherwise unremarkable.  Physical exam as listed above.  Patient has a care plan activated, however labs were ordered in triage.  I independently reviewed and interpreted the patient's labs and imaging.  CBC shows a mild anemia at 9.4 otherwise no leukocytosis.  Patient's CBC hemoglobin seems to be around this at baseline although fluctuates quite often.  BMP shows slightly elevated chloride, slightly decreased bicarb however has a normal anion gap.  Glucose slightly elevated at 177.  Lipase normal at 23.  Hypomagnesia at 1.2.  Patient's drug screen positive for cocaine.  POC occult negative.  I discussed the labs with the patient and discussed that this is likely has chronic pancreatitis.  Patient is adamant that he wants morphine and a CT scan.  This is not in his care plan, however I did discuss with my attending as he received morphine as last time here although patient is wandering the halls in no acute distress.  Ordered him a small amount of morphine and a CT scan.  Chest x-ray shows small left pleural effusion otherwise no acute cardiopulmonary  disease.  CT without contrast of the abdomen shows 1. Coarse calcifications consistent with chronic calcific pancreatitis. Questionable acute on chronic pancreatitis with trace stranding at the anterior head of the pancreas versus fat scarring from old pancreatitis. 2. 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 3. Gastroenteritis, constipation, diverticulosis  and likely undigested medication in the small and large bowel. 4. Aortic atherosclerosis.  Gave patient GI cocktail as well.  Seen at this is his possibly acute on chronic pancreatitis, patient is tolerating p.o. in the emergency department without any nausea or vomiting.  Discussed clear liquid diet for the next 24 hours to help with pain.  We will send the patient home with some Zofran.  We discussed return precautions about symptoms.  I advised him to stop using cocaine and his he is not drinking alcohol to follow-up with a primary care doctor.  He verbalized understanding and agrees to the plan.  Patient is stable be discharged home in good condition.  I discussed this case with my attending physician who cosigned this note including patient's presenting symptoms, physical exam, and planned diagnostics and interventions. Attending physician stated agreement with plan or made changes to plan which were implemented.   Final Clinical Impression(s) / ED Diagnoses Final diagnoses:  Alcohol-induced chronic pancreatitis [facility])    Rx / DC Orders ED Discharge Orders          Ordered    ondansetron (ZOFRAN) 4 MG tablet  Every 6 hours        11/03/21 0051              Sherrell Puller, PA-C 11/04/21 1547    Blanchie Dessert, MD 11/04/21 2337

## 2021-11-03 MED ORDER — ONDANSETRON HCL 4 MG PO TABS
4.0000 mg | ORAL_TABLET | Freq: Four times a day (QID) | ORAL | 0 refills | Status: DC
Start: 2021-11-03 — End: 2022-03-15

## 2021-11-03 NOTE — Discharge Instructions (Addendum)
You were seen in the ER for evaluation of your abdominal pain. I advice that you stay consistent with your medications and follow up with your PCP. I have listed the information to [facility] otherwise. Please stop drinking alcohol. Stop using cocaine and/or marijuana. If you have any concern, new or worsening symptoms, please return to the ED.   Contact a health care provider if: You have pain that does not get better with medicine. You have a fever. You have sudden weight loss. Get help right away if: Your pain suddenly gets worse. You have sudden swelling in your abdomen. You start to vomit often. You have diarrhea that does not go away. You vomit blood or have blood in your stool. You become confused or you have trouble thinking clearly. These symptoms may be an emergency. Get help right away. Call 911. Do not wait to see if the symptoms will go away. Do not drive yourself to the hospital.

## 2021-11-03 NOTE — ED Notes (Signed)
Patient verbalizes understanding of d/c instructions. Opportunities for questions and answers were provided. Pt d/c from ED and ambulated to lobby.  

## 2021-11-29 ENCOUNTER — Emergency Department (HOSPITAL_COMMUNITY)
Admission: EM | Admit: 2021-11-29 | Discharge: 2021-11-30 | Disposition: A | Discharge status: Home / Self Care | Payer: Medicaid Other | Attending: Emergency Medicine | Admitting: Emergency Medicine

## 2021-11-29 ENCOUNTER — Emergency Department (HOSPITAL_COMMUNITY): Payer: Medicaid Other

## 2021-11-29 ENCOUNTER — Other Ambulatory Visit: Payer: Self-pay

## 2021-11-29 DIAGNOSIS — R1084 Generalized abdominal pain: Secondary | ICD-10-CM | POA: Diagnosis present

## 2021-11-29 DIAGNOSIS — R079 Chest pain, unspecified: Secondary | ICD-10-CM | POA: Diagnosis not present

## 2021-11-29 DIAGNOSIS — Z91040 Latex allergy status: Secondary | ICD-10-CM | POA: Insufficient documentation

## 2021-11-29 LAB — CBC WITH DIFFERENTIAL/PLATELET
Abs Immature Granulocytes: 0 10*3/uL (ref 0.00–0.07)
Basophils Absolute: 0 10*3/uL (ref 0.0–0.1)
Basophils Relative: 0 %
Eosinophils Absolute: 0.2 10*3/uL (ref 0.0–0.5)
Eosinophils Relative: 3 %
HCT: 33.4 % — ABNORMAL LOW (ref 39.0–52.0)
Hemoglobin: 10.4 g/dL — ABNORMAL LOW (ref 13.0–17.0)
Lymphocytes Relative: 26 %
Lymphs Abs: 1.8 10*3/uL (ref 0.7–4.0)
MCH: 26.7 pg (ref 26.0–34.0)
MCHC: 31.1 g/dL (ref 30.0–36.0)
MCV: 85.9 fL (ref 80.0–100.0)
Monocytes Absolute: 0.5 10*3/uL (ref 0.1–1.0)
Monocytes Relative: 7 %
Neutro Abs: 4.4 10*3/uL (ref 1.7–7.7)
Neutrophils Relative %: 64 %
Platelets: 149 10*3/uL — ABNORMAL LOW (ref 150–400)
RBC: 3.89 MIL/uL — ABNORMAL LOW (ref 4.22–5.81)
RDW: 23.9 % — ABNORMAL HIGH (ref 11.5–15.5)
WBC: 6.9 10*3/uL (ref 4.0–10.5)
nRBC: 0.3 % — ABNORMAL HIGH (ref 0.0–0.2)
nRBC: 2 /100 WBC — ABNORMAL HIGH

## 2021-11-29 LAB — COMPREHENSIVE METABOLIC PANEL
ALT: 14 U/L (ref 0–44)
AST: 25 U/L (ref 15–41)
Albumin: 3.4 g/dL — ABNORMAL LOW (ref 3.5–5.0)
Alkaline Phosphatase: 93 U/L (ref 38–126)
Anion gap: 12 (ref 5–15)
BUN: <5 mg/dL — ABNORMAL LOW (ref 6–20)
CO2: 20 mmol/L — ABNORMAL LOW (ref 22–32)
Calcium: 8 mg/dL — ABNORMAL LOW (ref 8.9–10.3)
Chloride: 104 mmol/L (ref 98–111)
Creatinine, Ser: 0.83 mg/dL (ref 0.61–1.24)
GFR, Estimated: >60 mL/min (ref >60)
Glucose, Bld: 101 mg/dL — ABNORMAL HIGH (ref 70–99)
Potassium: 4 mmol/L (ref 3.5–5.1)
Sodium: 136 mmol/L (ref 135–145)
Total Bilirubin: 0.2 mg/dL — ABNORMAL LOW (ref 0.3–1.2)
Total Protein: 7.1 g/dL (ref 6.5–8.1)

## 2021-11-29 LAB — TROPONIN I (HIGH SENSITIVITY): Troponin I (High Sensitivity): 6 ng/L (ref <18)

## 2021-11-29 LAB — LIPASE, BLOOD: Lipase: 22 U/L (ref 11–51)

## 2021-11-29 NOTE — ED Triage Notes (Signed)
Patient BIB [facility] w/ chest pain and abdominal pain, n/v/d that started last night. Pt given 4 mg of morphine with EMS. Not given ASA or nitro d/t allergies and taking ED meds.

## 2021-11-29 NOTE — ED Provider Triage Note (Cosign Needed Addendum)
Emergency Medicine Provider Triage Evaluation Note  [name] , a 51 y.o. male  was evaluated in triage.  Pt complains of n/v/d x2 days. Now having cp. Received morphine pta.  Patient states that he is under pain contract and had a urine that was positive for marijuana.  His pain doctor put him on Suboxone told him that he had to have 3 clean urine tests prior to being able to get his regular oxycodone prescription.  Patient states that he looked up on the Internet that it could aggravate pancreatitis and thinks that is the cause of his severe left upper quadrant abdominal pain.  Review of Systems  Positive: cp Negative: sob  Physical Exam  There were no vitals taken for this visit. Gen:   Awake, no distress   Resp:  Normal effort  MSK:   Moves extremities without difficulty  Other:  TTP ,abd  Medical Decision Making  Medically screening exam initiated at 2:28 PM.  Appropriate orders placed.  Mosi Shubham Thackston was informed that the remainder of the evaluation will be completed by another provider, this initial triage assessment does not replace that evaluation, and the importance of remaining in the ED until their evaluation is complete.  Work up inititated EKG performed by EMS shows new ST segment depressions in V45 and 6, and T wave inversions in leads I, II and III as compared to EKG from 27 2023   Margarita Mail, PA-C 11/29/21 [place], [place], PA-C 11/29/21 1434

## 2021-11-30 ENCOUNTER — Telehealth: Payer: Self-pay

## 2021-11-30 ENCOUNTER — Encounter (HOSPITAL_COMMUNITY): Payer: Self-pay

## 2021-11-30 LAB — MAGNESIUM: Magnesium: 1.4 mg/dL — ABNORMAL LOW (ref 1.7–2.4)

## 2021-11-30 MED ORDER — ALUM & MAG HYDROXIDE-SIMETH 200-200-20 MG/5ML PO SUSP
30.0000 mL | Freq: Once | ORAL | Status: AC
  Administered 2021-11-30: 30 mL via ORAL
  Filled 2021-11-30: qty 30

## 2021-11-30 MED ORDER — HYOSCYAMINE SULFATE 0.125 MG SL SUBL
0.2500 mg | SUBLINGUAL_TABLET | Freq: Once | SUBLINGUAL | Status: AC
  Administered 2021-11-30: 0.25 mg via SUBLINGUAL
  Filled 2021-11-30: qty 2

## 2021-11-30 MED ORDER — MAGNESIUM OXIDE -MG SUPPLEMENT 400 (240 MG) MG PO TABS: 400.0000 mg | ORAL_TABLET | Freq: Every day | ORAL | 0 refills | Status: DC

## 2021-11-30 MED ORDER — SODIUM CHLORIDE 0.9 % IV BOLUS
1000.0000 mL | Freq: Once | INTRAVENOUS | Status: AC
  Administered 2021-11-30: 1000 mL via INTRAVENOUS

## 2021-11-30 MED ORDER — LORAZEPAM 2 MG/ML IJ SOLN
2.0000 mg | Freq: Once | INTRAMUSCULAR | Status: AC
  Administered 2021-11-30: 2 mg via INTRAVENOUS
  Filled 2021-11-30: qty 1

## 2021-11-30 MED ORDER — MAGNESIUM SULFATE 2 GM/50ML IV SOLN
2.0000 g | Freq: Once | INTRAVENOUS | Status: AC
Start: 2021-11-30 — End: 2021-11-30
  Administered 2021-11-30: 2 g via INTRAVENOUS
  Filled 2021-11-30: qty 50

## 2021-11-30 NOTE — Telephone Encounter (Signed)
RNCM received inbound call from patient requesting a copy of his AVS from his d/c today.   This RNCM is working remote, Environmental health practitioner who will have a copy of AVS at security.    No TOC needs at this time.

## 2021-11-30 NOTE — Telephone Encounter (Signed)
RNCM received a call from patient stating he didn't receive his AVS. This RNCM advised he needs to allow time for staff to get the paperwork up to the security desk.   Then patient states his medications is not available at [facility] on [address]. He is now requesting his medications be called into [facility] on [address]  Valarie Cones called [facility] [phone]. Magnesium oxide (MAG-OX) 400 (240 Mg) MG tablet. Take one tablet ('400mg'$  total) po daily, #8 Prescriber: Davonna Belling   Patient also requesting EDP indicates another medical reason for his ED visit today. This RNCM advised patient once he is discharged the EDP will not prescribe or write anything additional. Patient reports he is allergic to suboxone, that's why he was seen at [facility] ED.

## 2021-11-30 NOTE — ED Notes (Signed)
Pt in room c/o ABD pain. Pt states his pancreatitis is acting up and requesting morphine. Educated pt that I didn't have an order for that at this time but have other meds to try to ease his discomfort. Pt took one of his Home meds. Advised not to do that in the ER.

## 2021-11-30 NOTE — ED Provider Notes (Signed)
  Physical Exam  BP 125/83   Pulse 100   Temp 98 F (36.7 C) (Oral)   Resp 12   Ht '5\' 8"'$  (1.727 m)   Wt 61 kg   SpO2 97%   BMI 20.45 kg/m   Physical Exam  Procedures  Procedures  ED Course / MDM    Medical Decision Making Amount and/or Complexity of Data Reviewed Labs: ordered.  Risk OTC drugs. Prescription drug management.  Received patient in signout.  Nausea vomiting abdominal pain.  Work-up reassuring but did have a mild hypomagnesemia.  Supplemented IV.  We will continue oral supplementation.  History of same.  Has tolerated some orals.  Will discharge home.       Davonna Belling, MD 11/30/21 1236

## 2021-11-30 NOTE — ED Provider Notes (Addendum)
[facility] EMERGENCY DEPARTMENT Provider Note   CSN: [id number] Arrival date & time: 11/29/21  1424     History  Chief Complaint  Patient presents with   Chest Pain   Abdominal Pain    [name] is a 51 y.o. male.  Patient presents to the emergency department for evaluation of chest and abdominal pain.  Patient thinks it is related to his history of pancreatitis.  No associated shortness of breath.       Home Medications Prior to Admission medications   Medication Sig Start Date End Date Taking? Authorizing Provider  acetaminophen (TYLENOL) 500 MG tablet Take 500 mg by mouth every 6 (six) hours as needed for moderate pain or headache.    [provider]  albuterol (VENTOLIN HFA) 108 (90 Base) MCG/ACT inhaler Inhale 2 puffs into the lungs every 4 (four) hours as needed for wheezing or shortness of breath. 08/17/21   [name], MD  cholecalciferol (VITAMIN D) 25 MCG tablet Take 1 tablet (1,000 Units total) by mouth daily. Patient not taking: Reported on 08/15/2021 10/30/20   [name], MD  folic acid (FOLVITE) 1 MG tablet Take 1 tablet (1 mg total) by mouth daily. 11/26/17   [name], MD  gabapentin (NEURONTIN) 300 MG capsule Take 300 mg by mouth 3 (three) times daily as needed (for neuropathic pain).    [provider]  lidocaine (XYLOCAINE) 2 % solution Use as directed 15 mLs in the mouth or throat every 6 (six) hours as needed for mouth pain. 11/21/20   [name], [name], DO  lipase/protease/amylase (CREON) 36000 UNITS CPEP capsule Take 2 capsules (72,000 Units total) by mouth 3 (three) times daily with meals. 10/12/21   Willia Craze, NP  magnesium oxide (MAG-OX) 400 (240 Mg) MG tablet Take 400 mg by mouth daily. 12/24/20   [provider]  methocarbamol (ROBAXIN) 750 MG tablet Take 750 mg by mouth every 6 (six) hours as needed for muscle spasms. 05/17/21   [provider]  metoprolol (TOPROL-XL)  200 MG 24 hr tablet Take 100 mg by mouth daily. 10/29/20   [provider]  Multiple Vitamin (MULTIVITAMIN WITH MINERALS) TABS tablet Take 1 tablet by mouth daily. 09/06/17   Bonnell Public, MD  omeprazole (PRILOSEC) 40 MG capsule Take 1 capsule (40 mg total) by mouth 2 (two) times daily before a meal. 08/18/21   [name], MD  ondansetron (ZOFRAN) 4 MG tablet Take 1 tablet (4 mg total) by mouth every 6 (six) hours. 11/03/21   Sherrell Puller, PA-C  ondansetron (ZOFRAN-ODT) 4 MG disintegrating tablet Take 1 tablet (4 mg total) by mouth every 8 (eight) hours as needed for vomiting or nausea. 08/17/21   [name], MD  oxyCODONE-acetaminophen (PERCOCET/ROXICET) 5-325 MG tablet Take 1 tablet by mouth every 6 (six) hours as needed for moderate pain. Patient not taking: Reported on 08/15/2021 05/17/21   [provider]  Polyethyl Glycol-Propyl Glycol (SYSTANE OP) Place 1 drop into both eyes 4 (four) times daily as needed (for dryness).    [provider]  potassium chloride (KLOR-CON) 10 MEQ tablet Take 10 mEq by mouth daily.    [provider]  sucralfate (CARAFATE) 1 g tablet Take 1 tablet (1 g total) by mouth 4 (four) times daily -  with meals and at bedtime. 10/12/21   Willia Craze, NP  thiamine 100 MG tablet Take 1 tablet (100 mg total) by mouth daily. 10/16/18  [name], MD  verapamil (CALAN-SR) 120 MG CR tablet Take 120 mg by mouth at bedtime.    [provider]  Vitamin D, Ergocalciferol, (DRISDOL) 1.25 MG (50000 UNIT) CAPS capsule Take 50,000 Units by mouth every Wednesday.    [provider]  amitriptyline (ELAVIL) 25 MG tablet Take 1 tablet (25 mg total) by mouth at bedtime. Patient not taking: Reported on 08/08/2019 10/15/18 08/08/19  [name], MD  promethazine (PHENERGAN) 25 MG tablet Take 1 tablet (25 mg total) by mouth every 6 (six) hours as needed for nausea or vomiting. Patient not taking: Reported on 08/08/2019  03/02/19 08/08/19  Kinnie Feil, PA-C      Allergies    Robaxin [methocarbamol], Aspirin, Shellfish-derived products, Trazodone and nefazodone, Adhesive [tape], Latex, Toradol [ketorolac tromethamine], Contrast media [iodinated contrast media], Reglan [metoclopramide], and Salmon [fish oil]    Review of Systems   Review of Systems  Physical Exam Updated Vital Signs BP (!) 161/102   Pulse 97   Temp 97.9 F (36.6 C) (Oral)   Resp 12   Ht '5\' 8"'$  (1.727 m)   Wt 61 kg   SpO2 100%   BMI 20.45 kg/m  Physical Exam Vitals and nursing note reviewed.  Constitutional:      General: He is not in acute distress.    Appearance: He is well-developed.  HENT:     Head: Normocephalic and atraumatic.     Mouth/Throat:     Mouth: Mucous membranes are moist.  Eyes:     General: Vision grossly intact. Gaze aligned appropriately.     Extraocular Movements: Extraocular movements intact.     Conjunctiva/sclera: Conjunctivae normal.  Cardiovascular:     Rate and Rhythm: Normal rate and regular rhythm.     Pulses: Normal pulses.     Heart sounds: Normal heart sounds, S1 normal and S2 normal. No murmur heard.    No friction rub. No gallop.  Pulmonary:     Effort: Pulmonary effort is normal. No respiratory distress.     Breath sounds: Normal breath sounds.  Abdominal:     Palpations: Abdomen is soft.     Tenderness: There is generalized abdominal tenderness. There is no guarding or rebound.     Hernia: No hernia is present.  Musculoskeletal:        General: No swelling.     Cervical back: Full passive range of motion without pain, normal range of motion and neck supple. No pain with movement, spinous process tenderness or muscular tenderness. Normal range of motion.     Right lower leg: No edema.     Left lower leg: No edema.  Skin:    General: Skin is warm and dry.     Capillary Refill: Capillary refill takes less than 2 seconds.     Findings: No ecchymosis, erythema, lesion or wound.   Neurological:     Mental Status: He is alert and oriented to person, place, and time.     GCS: GCS eye subscore is 4. GCS verbal subscore is 5. GCS motor subscore is 6.     Cranial Nerves: Cranial nerves 2-12 are intact.     Sensory: Sensation is intact.     Motor: Motor function is intact. No weakness or abnormal muscle tone.     Coordination: Coordination is intact.  Psychiatric:        Mood and Affect: Mood normal.        Speech: Speech normal.  Behavior: Behavior normal.     ED Results / Procedures / Treatments   Labs (all labs ordered are listed, but only abnormal results are displayed) Labs Reviewed  CBC WITH DIFFERENTIAL/PLATELET - Abnormal; Notable for the following components:      Result Value   RBC 3.89 (*)    Hemoglobin 10.4 (*)    HCT 33.4 (*)    RDW 23.9 (*)    Platelets 149 (*)    nRBC 0.3 (*)    nRBC 2 (*)    All other components within normal limits  COMPREHENSIVE METABOLIC PANEL - Abnormal; Notable for the following components:   CO2 20 (*)    Glucose, Bld 101 (*)    BUN <5 (*)    Calcium 8.0 (*)    Albumin 3.4 (*)    Total Bilirubin 0.2 (*)    All other components within normal limits  LIPASE, BLOOD  TROPONIN I (HIGH SENSITIVITY)  TROPONIN I (HIGH SENSITIVITY)    EKG EKG Interpretation  Date/Time:  Monday November 29 2021 14:41:33 EDT Ventricular Rate:  103 PR Interval:  122 QRS Duration: 82 QT Interval:  318 QTC Calculation: 416 R Axis:   65 Text Interpretation: Sinus tachycardia with Premature atrial complexes Biatrial enlargement T wave abnormality, consider inferolateral ischemia Abnormal ECG When compared with ECG of 02-Nov-2021 16:10, Premature atrial complexes are now present T wave abnormality is now present Confirmed by Delora Fuel ([id number]) on 11/29/2021 11:08:54 PM  Radiology DG Chest 2 View  Result Date: 11/29/2021 CLINICAL DATA:  Chest pain EXAM: CHEST - 2 VIEW COMPARISON:  11/02/2021 FINDINGS: Lungs are clear.  No pleural  effusion or pneumothorax. The heart is normal in size. Visualized osseous structures are within normal limits. IMPRESSION: Normal chest radiographs. Electronically Signed   By: [name] M.D.   On: 11/29/2021 19:11    Procedures Procedures    Medications Ordered in ED Medications  hyoscyamine (LEVSIN SL) SL tablet 0.25 mg (has no administration in time range)    ED Course/ Medical Decision Making/ A&P                           Medical Decision Making Risk OTC drugs. Prescription drug management.   Patient well-known to the emergency department for frequent visits with similar complaints.  Suspect chronic pain syndrome as work-up has been unremarkable.  Patient does not require imaging today.  Patient does report a fair amount of diarrhea.  He is tachycardic, likely has some element of dehydration.  He also reports that he has not had a drink in some time, consider withdrawal.  We will give IV hydration, Ativan, check magnesium.  Signed out to oncoming ER physician.        Final Clinical Impression(s) / ED Diagnoses Final diagnoses:  Generalized abdominal pain    Rx / DC Orders ED Discharge Orders     None         Beya Tipps, Gwenyth Allegra, MD 11/30/21 0503    Orpah Greek, MD 11/30/21 [phone]

## 2021-11-30 NOTE — ED Notes (Signed)
Patient states he is having chest pain and abdominal pain related to  his pancreatitis. Patient is in in bed, in gown and was given ice chips.

## 2021-11-30 NOTE — Discharge Instructions (Signed)
Follow-up with your doctor.  Your magnesium was a little low today.

## 2021-11-30 NOTE — ED Notes (Signed)
Pt in room resting. NAD chest rising and falling.  

## 2021-12-05 ENCOUNTER — Inpatient Hospital Stay (HOSPITAL_COMMUNITY)
Admission: EM | Admit: 2021-12-05 | Discharge: 2021-12-24 | DRG: 438 | Disposition: A | Discharge status: Home / Self Care | Payer: Medicaid Other | Attending: Internal Medicine | Admitting: Internal Medicine

## 2021-12-05 ENCOUNTER — Emergency Department (HOSPITAL_COMMUNITY): Payer: Medicaid Other

## 2021-12-05 ENCOUNTER — Encounter (HOSPITAL_COMMUNITY): Payer: Self-pay

## 2021-12-05 DIAGNOSIS — R911 Solitary pulmonary nodule: Secondary | ICD-10-CM | POA: Diagnosis present

## 2021-12-05 DIAGNOSIS — F1020 Alcohol dependence, uncomplicated: Secondary | ICD-10-CM | POA: Diagnosis present

## 2021-12-05 DIAGNOSIS — K859 Acute pancreatitis without necrosis or infection, unspecified: Secondary | ICD-10-CM | POA: Diagnosis present

## 2021-12-05 DIAGNOSIS — D509 Iron deficiency anemia, unspecified: Secondary | ICD-10-CM | POA: Diagnosis present

## 2021-12-05 DIAGNOSIS — D72829 Elevated white blood cell count, unspecified: Secondary | ICD-10-CM | POA: Diagnosis present

## 2021-12-05 DIAGNOSIS — R109 Unspecified abdominal pain: Secondary | ICD-10-CM | POA: Diagnosis present

## 2021-12-05 DIAGNOSIS — K852 Alcohol induced acute pancreatitis without necrosis or infection: Principal | ICD-10-CM | POA: Diagnosis present

## 2021-12-05 DIAGNOSIS — F319 Bipolar disorder, unspecified: Secondary | ICD-10-CM | POA: Diagnosis present

## 2021-12-05 DIAGNOSIS — K8681 Exocrine pancreatic insufficiency: Secondary | ICD-10-CM | POA: Diagnosis present

## 2021-12-05 DIAGNOSIS — K2920 Alcoholic gastritis without bleeding: Secondary | ICD-10-CM | POA: Diagnosis present

## 2021-12-05 DIAGNOSIS — R000 Tachycardia, unspecified: Secondary | ICD-10-CM

## 2021-12-05 DIAGNOSIS — K860 Alcohol-induced chronic pancreatitis: Secondary | ICD-10-CM | POA: Diagnosis present

## 2021-12-05 DIAGNOSIS — K863 Pseudocyst of pancreas: Secondary | ICD-10-CM | POA: Diagnosis present

## 2021-12-05 DIAGNOSIS — Z888 Allergy status to other drugs, medicaments and biological substances status: Secondary | ICD-10-CM

## 2021-12-05 DIAGNOSIS — Z86010 Personal history of colonic polyps: Secondary | ICD-10-CM

## 2021-12-05 DIAGNOSIS — F17210 Nicotine dependence, cigarettes, uncomplicated: Secondary | ICD-10-CM | POA: Diagnosis present

## 2021-12-05 DIAGNOSIS — Z91041 Radiographic dye allergy status: Secondary | ICD-10-CM

## 2021-12-05 DIAGNOSIS — G894 Chronic pain syndrome: Secondary | ICD-10-CM | POA: Diagnosis present

## 2021-12-05 DIAGNOSIS — R64 Cachexia: Secondary | ICD-10-CM | POA: Diagnosis present

## 2021-12-05 DIAGNOSIS — E7800 Pure hypercholesterolemia, unspecified: Secondary | ICD-10-CM | POA: Diagnosis present

## 2021-12-05 DIAGNOSIS — Z681 Body mass index (BMI) 19 or less, adult: Secondary | ICD-10-CM

## 2021-12-05 DIAGNOSIS — I10 Essential (primary) hypertension: Secondary | ICD-10-CM | POA: Diagnosis present

## 2021-12-05 DIAGNOSIS — I4940 Unspecified premature depolarization: Secondary | ICD-10-CM | POA: Diagnosis present

## 2021-12-05 DIAGNOSIS — N179 Acute kidney failure, unspecified: Secondary | ICD-10-CM | POA: Diagnosis present

## 2021-12-05 DIAGNOSIS — D573 Sickle-cell trait: Secondary | ICD-10-CM | POA: Diagnosis present

## 2021-12-05 DIAGNOSIS — E872 Acidosis, unspecified: Secondary | ICD-10-CM

## 2021-12-05 DIAGNOSIS — E43 Unspecified severe protein-calorie malnutrition: Secondary | ICD-10-CM | POA: Diagnosis present

## 2021-12-05 DIAGNOSIS — F1410 Cocaine abuse, uncomplicated: Secondary | ICD-10-CM | POA: Diagnosis present

## 2021-12-05 DIAGNOSIS — G40909 Epilepsy, unspecified, not intractable, without status epilepticus: Secondary | ICD-10-CM | POA: Diagnosis present

## 2021-12-05 DIAGNOSIS — Z8249 Family history of ischemic heart disease and other diseases of the circulatory system: Secondary | ICD-10-CM

## 2021-12-05 DIAGNOSIS — F419 Anxiety disorder, unspecified: Secondary | ICD-10-CM | POA: Diagnosis present

## 2021-12-05 DIAGNOSIS — K862 Cyst of pancreas: Secondary | ICD-10-CM | POA: Diagnosis present

## 2021-12-05 DIAGNOSIS — Z886 Allergy status to analgesic agent status: Secondary | ICD-10-CM

## 2021-12-05 DIAGNOSIS — I456 Pre-excitation syndrome: Secondary | ICD-10-CM | POA: Diagnosis present

## 2021-12-05 DIAGNOSIS — I471 Supraventricular tachycardia: Secondary | ICD-10-CM | POA: Diagnosis present

## 2021-12-05 DIAGNOSIS — Z808 Family history of malignant neoplasm of other organs or systems: Secondary | ICD-10-CM

## 2021-12-05 DIAGNOSIS — Z79899 Other long term (current) drug therapy: Secondary | ICD-10-CM

## 2021-12-05 DIAGNOSIS — E8809 Other disorders of plasma-protein metabolism, not elsewhere classified: Secondary | ICD-10-CM

## 2021-12-05 DIAGNOSIS — D649 Anemia, unspecified: Secondary | ICD-10-CM | POA: Diagnosis present

## 2021-12-05 DIAGNOSIS — J439 Emphysema, unspecified: Secondary | ICD-10-CM | POA: Diagnosis present

## 2021-12-05 DIAGNOSIS — Z720 Tobacco use: Secondary | ICD-10-CM | POA: Diagnosis present

## 2021-12-05 DIAGNOSIS — Z91040 Latex allergy status: Secondary | ICD-10-CM

## 2021-12-05 DIAGNOSIS — Z8711 Personal history of peptic ulcer disease: Secondary | ICD-10-CM

## 2021-12-05 DIAGNOSIS — E861 Hypovolemia: Secondary | ICD-10-CM | POA: Diagnosis present

## 2021-12-05 DIAGNOSIS — I480 Paroxysmal atrial fibrillation: Secondary | ICD-10-CM | POA: Diagnosis present

## 2021-12-05 DIAGNOSIS — Z765 Malingerer [conscious simulation]: Secondary | ICD-10-CM

## 2021-12-05 DIAGNOSIS — R197 Diarrhea, unspecified: Secondary | ICD-10-CM | POA: Diagnosis present

## 2021-12-05 DIAGNOSIS — K219 Gastro-esophageal reflux disease without esophagitis: Secondary | ICD-10-CM | POA: Diagnosis present

## 2021-12-05 MED ORDER — ALUM & MAG HYDROXIDE-SIMETH 200-200-20 MG/5ML PO SUSP
30.0000 mL | Freq: Once | ORAL | Status: AC
  Administered 2021-12-05: 30 mL via ORAL
  Filled 2021-12-05: qty 30

## 2021-12-05 NOTE — ED Provider Notes (Signed)
[facility]-EMERGENCY DEPT [facility] Emergency Department Provider Note MRN:  [id number]  Arrival date & time: 12/07/21     Chief Complaint   Multiple Complaints    History of Present Illness   [name] is a 51 y.o. year-old male presents to the ED with chief complaint of chest pain, epigastric pain, nausea, vomiting, and diarrhea.  He reports persistent symptoms for the past week.  He states that he has been off of drugs for 2 months and has been getting clean urines in a attempt to get into pain management.  He states that he does continue to use alcohol and has well documented history of alcoholic pancreatitis.  He also states that his heart has been racing and he has been having worsening chest pains when he walks.  He reports that he has been taking all of his prescribed meds, but symptoms persist.  History provided by patient.   Review of Systems  Pertinent review of systems noted in HPI.    Physical Exam   Vitals:   12/06/21 2100 12/06/21 2323  BP: 115/70 (!) 143/92  Pulse:    Resp: 12 13  Temp:  97.6 F (36.4 C)  SpO2: 100%     CONSTITUTIONAL:  chronically ill-appearing, NAD NEURO:  Alert and oriented x 3, CN 3-12 grossly intact EYES:  eyes equal and reactive ENT/NECK:  Supple, no stridor  CARDIO:  tachycardic, regular rhythm, appears well-perfused  PULM:  No respiratory distress, CTAB GI/GU:  non-distended, epigastric abdominal tenderness MSK/SPINE:  No gross deformities, no edema, moves all extremities  SKIN:  no rash, atraumatic   *Additional and/or pertinent findings included in MDM below  Diagnostic and Interventional Summary    EKG Interpretation  Date/Time:  Sunday December 05 2021 22:16:58 EDT Ventricular Rate:  109 PR Interval:  101 QRS Duration: 92 QT Interval:  288 QTC Calculation: 388 R Axis:   81 Text Interpretation: Sinus tachycardia Multiform ventricular premature complexes Consider right atrial enlargement Repol abnrm suggests  ischemia, diffuse leads Confirmed by Drema Pry [phone]) on 12/06/2021 12:48:04 AM       Labs Reviewed  CBC WITH DIFFERENTIAL/PLATELET - Abnormal; Notable for the following components:      Result Value   Hemoglobin 11.7 (*)    HCT 38.5 (*)    RDW 22.9 (*)    Platelets 475 (*)    All other components within normal limits  COMPREHENSIVE METABOLIC PANEL - Abnormal; Notable for the following components:   Sodium 132 (*)    CO2 14 (*)    Glucose, Bld 104 (*)    All other components within normal limits  MAGNESIUM - Abnormal; Notable for the following components:   Magnesium 1.4 (*)    All other components within normal limits  D-DIMER, QUANTITATIVE - Abnormal; Notable for the following components:   D-Dimer, Quant 0.54 (*)    All other components within normal limits  GASTROINTESTINAL PANEL BY PCR, STOOL (REPLACES STOOL CULTURE)  LIPASE, BLOOD  RAPID URINE DRUG SCREEN, HOSP PERFORMED  HIV ANTIBODY (ROUTINE TESTING W REFLEX)  CBC  BASIC METABOLIC PANEL  TROPONIN I (HIGH SENSITIVITY)  TROPONIN I (HIGH SENSITIVITY)    CT Angio Chest PE W and/or Wo Contrast  Final Result    DG Chest Port 1 View  Final Result      Medications  metoprolol succinate (TOPROL-XL) 24 hr tablet 100 mg (100 mg Oral Given 12/06/21 1326)  sucralfate (CARAFATE) 1 GM/10ML suspension 1 g (1 g Oral Given 12/06/21  2231)  ondansetron (ZOFRAN) injection 4 mg (has no administration in time range)  morphine (PF) 2 MG/ML injection 2 mg (2 mg Intravenous Given 12/06/21 2239)  HYDROmorphone (DILAUDID) injection 1 mg (1 mg Intravenous Given 12/06/21 2024)  lactated ringers infusion ( Intravenous New Bag/Given 12/06/21 2231)  LORazepam (ATIVAN) tablet 1-4 mg (1 mg Oral Given 12/06/21 2254)    Or  LORazepam (ATIVAN) injection 1-4 mg ( Intravenous See Alternative 12/06/21 2254)  thiamine (VITAMIN B1) tablet 100 mg (100 mg Oral Given 12/06/21 2027)    Or  thiamine (VITAMIN B1) injection 100 mg ( Intravenous See  Alternative 12/06/21 2027)  folic acid (FOLVITE) tablet 1 mg (1 mg Oral Given 12/06/21 2026)  multivitamin with minerals tablet 1 tablet (1 tablet Oral Given 12/06/21 2027)  enoxaparin (LOVENOX) injection 40 mg (40 mg Subcutaneous Given 12/06/21 2254)  lipase/protease/amylase (CREON) capsule 72,000 Units (has no administration in time range)  pantoprazole (PROTONIX) EC tablet 40 mg (has no administration in time range)  magnesium oxide (MAG-OX) tablet 400 mg (has no administration in time range)  acetaminophen (TYLENOL) tablet 500 mg (has no administration in time range)  alum & mag hydroxide-simeth (MAALOX/MYLANTA) 200-200-20 MG/5ML suspension 30 mL (30 mLs Oral Given 12/05/21 2300)  magnesium sulfate IVPB 2 g 50 mL (0 g Intravenous Stopped 12/06/21 0205)  lactated ringers bolus 1,000 mL (0 mLs Intravenous Stopped 12/06/21 0328)  LORazepam (ATIVAN) injection 1 mg (1 mg Intravenous Given 12/06/21 0130)  lactated ringers bolus 1,000 mL (0 mLs Intravenous Stopped 12/06/21 1016)  morphine (PF) 4 MG/ML injection 4 mg (4 mg Intravenous Given 12/06/21 0612)  methylPREDNISolone sodium succinate (SOLU-MEDROL) 40 mg/mL injection 40 mg (40 mg Intravenous Given 12/06/21 0800)  diphenhydrAMINE (BENADRYL) capsule 50 mg (50 mg Oral Given 12/06/21 1132)    Or  diphenhydrAMINE (BENADRYL) injection 50 mg ( Intravenous See Alternative 12/06/21 1132)  morphine (PF) 4 MG/ML injection 4 mg (4 mg Intravenous Given 12/06/21 1131)  iohexol (OMNIPAQUE) 350 MG/ML injection 78 mL (78 mLs Intravenous Contrast Given 12/06/21 1251)  morphine (PF) 4 MG/ML injection 4 mg (4 mg Intravenous Given 12/06/21 1810)  lidocaine (XYLOCAINE) 2 % viscous mouth solution 15 mL (15 mLs Mouth/Throat Given 12/06/21 1810)     Procedures  /  Critical Care Procedures  ED Course and Medical Decision Making  I have reviewed the triage vital signs, the nursing notes, and pertinent available records from the EMR.  Social Determinants Affecting Complexity of  Care: Patient has no clinically significant social determinants affecting this chief complaint..   ED Course:   Patient here with chest pain and epigastric pain.  Top differential diagnoses include chronic pancreatitis, ACS, PE. Medical Decision Making Amount and/or Complexity of Data Reviewed Labs: ordered. Radiology: ordered. ECG/medicine tests: ordered.  Risk OTC drugs. Prescription drug management. Decision regarding hospitalization.     Consultants:    Treatment and Plan: Plan at signout is for follow-up on D-dimer and monitor HR.  Patient complains of persistent tachycardia and SOB with exertion.    Patient discussed with attending physician, Dr. Eudelia Bunch, who agrees with plan for dimer and fluids.   Patient signed out to oncoming team at shift change.  Final Clinical Impressions(s) / ED Diagnoses     ICD-10-CM   1. WPW (Wolff-Parkinson-White syndrome)  I45.6     2. Tachycardia  R00.0       ED Discharge Orders     None         Discharge Instructions Discussed with and  Provided to Patient:   Discharge Instructions   None      Roxy Horseman, Cordelia Poche 12/07/21 0037    Alvira Monday, MD 12/08/21 [phone]

## 2021-12-05 NOTE — ED Triage Notes (Signed)
Pt from home via [facility] with c/c of CP, dizziness, abd pain, and diarrhea, pt was seen on 7/24 for same, pt reports has not been able to take meds d/t n/v. Pt has hx of WPW, HR 110, otherwise VSS, A&O x4.

## 2021-12-05 NOTE — ED Notes (Signed)
Pt up to bedside commode at this time.

## 2021-12-06 ENCOUNTER — Emergency Department (HOSPITAL_COMMUNITY): Payer: Medicaid Other

## 2021-12-06 DIAGNOSIS — I456 Pre-excitation syndrome: Secondary | ICD-10-CM | POA: Diagnosis not present

## 2021-12-06 DIAGNOSIS — F1020 Alcohol dependence, uncomplicated: Secondary | ICD-10-CM

## 2021-12-06 DIAGNOSIS — I480 Paroxysmal atrial fibrillation: Secondary | ICD-10-CM | POA: Diagnosis present

## 2021-12-06 DIAGNOSIS — I471 Supraventricular tachycardia: Secondary | ICD-10-CM

## 2021-12-06 DIAGNOSIS — R911 Solitary pulmonary nodule: Secondary | ICD-10-CM

## 2021-12-06 DIAGNOSIS — R1013 Epigastric pain: Secondary | ICD-10-CM | POA: Diagnosis not present

## 2021-12-06 DIAGNOSIS — Z720 Tobacco use: Secondary | ICD-10-CM | POA: Diagnosis not present

## 2021-12-06 DIAGNOSIS — I10 Essential (primary) hypertension: Secondary | ICD-10-CM

## 2021-12-06 LAB — CBC WITH DIFFERENTIAL/PLATELET
Abs Immature Granulocytes: 0.05 K/uL (ref 0.00–0.07)
Basophils Absolute: 0 K/uL (ref 0.0–0.1)
Basophils Relative: 0 %
Eosinophils Absolute: 0.2 K/uL (ref 0.0–0.5)
Eosinophils Relative: 2 %
HCT: 38.5 % — ABNORMAL LOW (ref 39.0–52.0)
Hemoglobin: 11.7 g/dL — ABNORMAL LOW (ref 13.0–17.0)
Immature Granulocytes: 1 %
Lymphocytes Relative: 30 %
Lymphs Abs: 2.9 K/uL (ref 0.7–4.0)
MCH: 26 pg (ref 26.0–34.0)
MCHC: 30.4 g/dL (ref 30.0–36.0)
MCV: 85.6 fL (ref 80.0–100.0)
Monocytes Absolute: 1 K/uL (ref 0.1–1.0)
Monocytes Relative: 10 %
Neutro Abs: 5.4 K/uL (ref 1.7–7.7)
Neutrophils Relative %: 57 %
Platelets: 475 K/uL — ABNORMAL HIGH (ref 150–400)
RBC: 4.5 MIL/uL (ref 4.22–5.81)
RDW: 22.9 % — ABNORMAL HIGH (ref 11.5–15.5)
WBC: 9.5 K/uL (ref 4.0–10.5)
nRBC: 0 % (ref 0.0–0.2)

## 2021-12-06 LAB — COMPREHENSIVE METABOLIC PANEL
ALT: 18 U/L (ref 0–44)
AST: 28 U/L (ref 15–41)
Albumin: 3.8 g/dL (ref 3.5–5.0)
Alkaline Phosphatase: 101 U/L (ref 38–126)
Anion gap: 11 (ref 5–15)
BUN: 9 mg/dL (ref 6–20)
CO2: 14 mmol/L — ABNORMAL LOW (ref 22–32)
Calcium: 9.3 mg/dL (ref 8.9–10.3)
Chloride: 107 mmol/L (ref 98–111)
Creatinine, Ser: 0.95 mg/dL (ref 0.61–1.24)
GFR, Estimated: >60 mL/min (ref >60)
Glucose, Bld: 104 mg/dL — ABNORMAL HIGH (ref 70–99)
Potassium: 4.2 mmol/L (ref 3.5–5.1)
Sodium: 132 mmol/L — ABNORMAL LOW (ref 135–145)
Total Bilirubin: 0.6 mg/dL (ref 0.3–1.2)
Total Protein: 7.9 g/dL (ref 6.5–8.1)

## 2021-12-06 LAB — RAPID URINE DRUG SCREEN, HOSP PERFORMED
Amphetamines: NOT DETECTED
Barbiturates: NOT DETECTED
Benzodiazepines: NOT DETECTED
Cocaine: NOT DETECTED
Opiates: NOT DETECTED
Tetrahydrocannabinol: NOT DETECTED

## 2021-12-06 LAB — TROPONIN I (HIGH SENSITIVITY)
Troponin I (High Sensitivity): 5 ng/L
Troponin I (High Sensitivity): 7 ng/L

## 2021-12-06 LAB — LIPASE, BLOOD: Lipase: 23 U/L (ref 11–51)

## 2021-12-06 LAB — MAGNESIUM: Magnesium: 1.4 mg/dL — ABNORMAL LOW (ref 1.7–2.4)

## 2021-12-06 LAB — D-DIMER, QUANTITATIVE: D-Dimer, Quant: 0.54 ug/mL-FEU — ABNORMAL HIGH (ref 0.00–0.50)

## 2021-12-06 MED ORDER — THIAMINE HCL 100 MG/ML IJ SOLN: 100.0000 mg | Freq: Every day | INTRAMUSCULAR | Status: DC

## 2021-12-06 MED ORDER — THIAMINE HCL 100 MG PO TABS
100.0000 mg | ORAL_TABLET | Freq: Every day | ORAL | Status: DC
  Administered 2021-12-06 – 2021-12-24 (×17): 100 mg via ORAL
  Filled 2021-12-06 (×17): qty 1

## 2021-12-06 MED ORDER — ENOXAPARIN SODIUM 40 MG/0.4ML IJ SOSY
40.0000 mg | PREFILLED_SYRINGE | INTRAMUSCULAR | Status: DC
Start: 2021-12-06 — End: 2021-12-24
  Administered 2021-12-06 – 2021-12-23 (×18): 40 mg via SUBCUTANEOUS
  Filled 2021-12-06 (×18): qty 0.4

## 2021-12-06 MED ORDER — MORPHINE SULFATE (PF) 4 MG/ML IV SOLN
4.0000 mg | Freq: Once | INTRAVENOUS | Status: AC
  Administered 2021-12-06: 4 mg via INTRAVENOUS
  Filled 2021-12-06: qty 1

## 2021-12-06 MED ORDER — PANCRELIPASE (LIP-PROT-AMYL) 36000-114000 UNITS PO CPEP
72000.0000 [IU] | ORAL_CAPSULE | Freq: Three times a day (TID) | ORAL | Status: DC
  Administered 2021-12-07 – 2021-12-14 (×8): 72000 [IU] via ORAL
  Filled 2021-12-06 (×24): qty 2

## 2021-12-06 MED ORDER — LACTATED RINGERS IV SOLN: INTRAVENOUS | Status: DC

## 2021-12-06 MED ORDER — PANTOPRAZOLE SODIUM 40 MG PO TBEC
40.0000 mg | DELAYED_RELEASE_TABLET | Freq: Every day | ORAL | Status: DC
  Administered 2021-12-07 – 2021-12-13 (×5): 40 mg via ORAL
  Filled 2021-12-06 (×5): qty 1

## 2021-12-06 MED ORDER — DIPHENHYDRAMINE HCL 25 MG PO CAPS
50.0000 mg | ORAL_CAPSULE | Freq: Once | ORAL | Status: AC
  Administered 2021-12-06: 50 mg via ORAL
  Filled 2021-12-06: qty 2

## 2021-12-06 MED ORDER — MAGNESIUM OXIDE -MG SUPPLEMENT 400 (240 MG) MG PO TABS
400.0000 mg | ORAL_TABLET | Freq: Every day | ORAL | Status: DC
  Administered 2021-12-07 – 2021-12-14 (×6): 400 mg via ORAL
  Filled 2021-12-06 (×6): qty 1

## 2021-12-06 MED ORDER — DIPHENHYDRAMINE HCL 50 MG/ML IJ SOLN
50.0000 mg | Freq: Once | INTRAMUSCULAR | Status: AC
  Filled 2021-12-06: qty 1

## 2021-12-06 MED ORDER — SUCRALFATE 1 GM/10ML PO SUSP
1.0000 g | Freq: Three times a day (TID) | ORAL | Status: DC
Start: 2021-12-06 — End: 2021-12-24
  Administered 2021-12-06 – 2021-12-24 (×56): 1 g via ORAL
  Filled 2021-12-06 (×59): qty 10

## 2021-12-06 MED ORDER — LACTATED RINGERS IV SOLN: INTRAVENOUS | Status: AC

## 2021-12-06 MED ORDER — LIDOCAINE VISCOUS HCL 2 % MT SOLN
15.0000 mL | Freq: Once | OROMUCOSAL | Status: AC
  Administered 2021-12-06: 15 mL via OROMUCOSAL
  Filled 2021-12-06: qty 15

## 2021-12-06 MED ORDER — ACETAMINOPHEN 500 MG PO TABS: 500.0000 mg | ORAL_TABLET | Freq: Four times a day (QID) | ORAL | Status: DC | PRN

## 2021-12-06 MED ORDER — LACTATED RINGERS IV BOLUS
1000.0000 mL | Freq: Once | INTRAVENOUS | Status: AC
  Administered 2021-12-06: 1000 mL via INTRAVENOUS

## 2021-12-06 MED ORDER — METHYLPREDNISOLONE SODIUM SUCC 40 MG IJ SOLR
40.0000 mg | Freq: Once | INTRAMUSCULAR | Status: AC
  Administered 2021-12-06: 40 mg via INTRAVENOUS
  Filled 2021-12-06: qty 1

## 2021-12-06 MED ORDER — HYDROMORPHONE HCL 1 MG/ML IJ SOLN
1.0000 mg | INTRAMUSCULAR | Status: DC | PRN
  Administered 2021-12-06 – 2021-12-07 (×2): 1 mg via INTRAVENOUS
  Filled 2021-12-06 (×3): qty 1

## 2021-12-06 MED ORDER — IOHEXOL 350 MG/ML SOLN
78.0000 mL | Freq: Once | INTRAVENOUS | Status: AC | PRN
  Administered 2021-12-06: 78 mL via INTRAVENOUS

## 2021-12-06 MED ORDER — ONDANSETRON HCL 4 MG/2ML IJ SOLN
4.0000 mg | Freq: Four times a day (QID) | INTRAMUSCULAR | Status: DC | PRN
  Administered 2021-12-16 – 2021-12-21 (×2): 4 mg via INTRAVENOUS
  Filled 2021-12-06 (×2): qty 2

## 2021-12-06 MED ORDER — METOPROLOL SUCCINATE ER 100 MG PO TB24
100.0000 mg | ORAL_TABLET | Freq: Every day | ORAL | Status: DC
  Administered 2021-12-06 – 2021-12-24 (×17): 100 mg via ORAL
  Filled 2021-12-06 (×4): qty 1
  Filled 2021-12-06: qty 4
  Filled 2021-12-06 (×12): qty 1

## 2021-12-06 MED ORDER — ACETAMINOPHEN 500 MG PO TABS
500.0000 mg | ORAL_TABLET | Freq: Four times a day (QID) | ORAL | Status: DC | PRN
  Administered 2021-12-09 – 2021-12-23 (×25): 500 mg via ORAL
  Filled 2021-12-06 (×22): qty 1

## 2021-12-06 MED ORDER — MORPHINE SULFATE (PF) 2 MG/ML IV SOLN
2.0000 mg | INTRAVENOUS | Status: DC | PRN
  Administered 2021-12-06 – 2021-12-07 (×3): 2 mg via INTRAVENOUS
  Filled 2021-12-06 (×3): qty 1

## 2021-12-06 MED ORDER — ADULT MULTIVITAMIN W/MINERALS CH
1.0000 | ORAL_TABLET | Freq: Every day | ORAL | Status: DC
  Administered 2021-12-06 – 2021-12-24 (×17): 1 via ORAL
  Filled 2021-12-06 (×17): qty 1

## 2021-12-06 MED ORDER — LORAZEPAM 2 MG/ML IJ SOLN
1.0000 mg | Freq: Once | INTRAMUSCULAR | Status: AC
  Administered 2021-12-06: 1 mg via INTRAVENOUS
  Filled 2021-12-06: qty 1

## 2021-12-06 MED ORDER — FOLIC ACID 1 MG PO TABS
1.0000 mg | ORAL_TABLET | Freq: Every day | ORAL | Status: DC
  Administered 2021-12-06 – 2021-12-24 (×16): 1 mg via ORAL
  Filled 2021-12-06 (×17): qty 1

## 2021-12-06 MED ORDER — LORAZEPAM 1 MG PO TABS
1.0000 mg | ORAL_TABLET | Freq: Four times a day (QID) | ORAL | Status: DC | PRN
  Administered 2021-12-06 – 2021-12-09 (×3): 1 mg via ORAL
  Filled 2021-12-06 (×3): qty 1

## 2021-12-06 MED ORDER — LORAZEPAM 2 MG/ML IJ SOLN
1.0000 mg | INTRAMUSCULAR | Status: DC | PRN
  Administered 2021-12-09: 2 mg via INTRAVENOUS
  Filled 2021-12-06: qty 1

## 2021-12-06 MED ORDER — MAGNESIUM SULFATE 2 GM/50ML IV SOLN
2.0000 g | Freq: Once | INTRAVENOUS | Status: AC
Start: 2021-12-06 — End: 2021-12-06
  Administered 2021-12-06: 2 g via INTRAVENOUS
  Filled 2021-12-06: qty 50

## 2021-12-06 NOTE — H&P (Signed)
History and Physical    Patient: [name] TMH:[id number] DOB: [birth year] DOA: 12/05/2021 DOS: the patient was seen and examined on 12/06/2021 PCP: Pcp, No  Patient coming from: Home  Chief Complaint:  Chief Complaint  Patient presents with   Multiple Complaints    HPI: [name] is a 51 y.o. male with medical history significant of Meuth syndrome s/p ablation, recurrent pancreatitis, polysubstance abuse including EtOH and cocaine, seizure, hypertension who presents with abdominal and chest pain.  Pt says he has been having burning sharp upper abdominal pain for close to a week. Last few days have been nausea, vomiting as well with left sided chest pain. He has felt fluttering and heart pounding. Last took his metoprolol about a week ago. Also ran out of his Carafate and his Norco. He is following with pain management but they are not refilling his Norco until he has 3 clean urine. Has been off cocaine for 6 weeks. Last alcohol use 2 days ago and had one beer. Normally have 6-12 beers.  No travel. No recent antibiotics.   In the ED, he was afebrile and tachycardic up to 180s to 190s when standing.  Improves at rest.  No leukocytosis, hemoglobin 11.4, thrombocytosis at 475.  No significant electrolyte abnormalities.  UDS negative.  EKG with pre-excitation   CTA chest is negative for PE but had incidental finding of 3 mm left upper lobe nodule.  He was given Benadryl, Maalox, 2 L of LR, IV Ativan, IV magnesium, IV Solu-Medrol and a total of 8 mg of IV morphine in the ED. Review of Systems: As mentioned in the history of present illness. All other systems reviewed and are negative. Past Medical History:  Diagnosis Date   Alcoholism /alcohol abuse    Anemia    Anxiety    Arthritis    "knees; arms; elbows" (03/26/2015)   Asthma    Bipolar disorder (HCC)    Chronic bronchitis (HCC)    Chronic lower back pain    Chronic pancreatitis ([place])    Cocaine abuse ([place])     Depression    Family history of adverse reaction to anesthesia    "grandmother gets confused"   Femoral condyle fracture ([place]) 03/08/2014   left medial/notes 03/09/2014   GERD (gastroesophageal reflux disease)    H/O hiatal hernia    H/O suicide attempt 10/2012   High cholesterol    History of blood transfusion 10/2012   "when I tried to commit suicide"   History of stomach ulcers    Hypertension    Marijuana abuse, continuous    Migraine    "a few times/year" (03/26/2015)   Pneumonia 1990's X 3   PTSD (post-traumatic stress disorder)    Sickle cell trait ([place])    WPW (Wolff-Parkinson-White syndrome)    [name] 03/06/2013   Past Surgical History:  Procedure Laterality Date   BIOPSY  11/25/2017   Procedure: BIOPSY;  Surgeon: Arta Silence, MD;  Location: [facility];  Service: Endoscopy;;   BIOPSY  10/14/2018   Procedure: BIOPSY;  Surgeon: Arta Silence, MD;  Location: [facility];  Service: Endoscopy;;   CARDIAC CATHETERIZATION     CYST ENTEROSTOMY  01/02/2020   Procedure: CYST ASPIRATION;  Surgeon: Milus Banister, MD;  Location: [facility];  Service: Endoscopy;;   ESOPHAGOGASTRODUODENOSCOPY (EGD) WITH PROPOFOL N/A 11/25/2017   Procedure: ESOPHAGOGASTRODUODENOSCOPY (EGD) WITH PROPOFOL;  Surgeon: Arta Silence, MD;  Location: [place];  Service: Endoscopy;  Laterality: N/A;   ESOPHAGOGASTRODUODENOSCOPY (EGD) WITH  PROPOFOL Left 10/14/2018   Procedure: ESOPHAGOGASTRODUODENOSCOPY (EGD) WITH PROPOFOL;  Surgeon: Arta Silence, MD;  Location[facility] ENDOSCOPY;  Service: Endoscopy;  Laterality: Left;   ESOPHAGOGASTRODUODENOSCOPY (EGD) WITH PROPOFOL N/A 11/14/2018   Procedure: ESOPHAGOGASTRODUODENOSCOPY (EGD) WITH PROPOFOL;  Surgeon: Laurence Spates, MD;  Location: [facility];  Service: Gastroenterology;  Laterality: N/A;   ESOPHAGOGASTRODUODENOSCOPY (EGD) WITH PROPOFOL N/A 01/02/2020   Procedure: ESOPHAGOGASTRODUODENOSCOPY (EGD) WITH PROPOFOL;  Surgeon: Milus Banister, MD;   Location: [facility];  Service: Endoscopy;  Laterality: N/A;   ESOPHAGOGASTRODUODENOSCOPY (EGD) WITH PROPOFOL N/A 10/25/2020   Procedure: ESOPHAGOGASTRODUODENOSCOPY (EGD) WITH PROPOFOL;  Surgeon: Rush Landmark Telford Nab., MD;  Location: [place];  Service: Gastroenterology;  Laterality: N/A;   EUS N/A 01/02/2020   Procedure: UPPER ENDOSCOPIC ULTRASOUND (EUS) RADIAL;  Surgeon: Milus Banister, MD;  Location: [facility];  Service: Endoscopy;  Laterality: N/A;   EYE SURGERY Left 1990's   "result of trauma"    FACIAL FRACTURE SURGERY Left 1990's   "result of trauma"    FLEXIBLE SIGMOIDOSCOPY N/A 10/25/2020   Procedure: FLEXIBLE SIGMOIDOSCOPY;  Surgeon: Irving Copas., MD;  Location: [place];  Service: Gastroenterology;  Laterality: N/A;   FRACTURE SURGERY     HEMOSTASIS CLIP PLACEMENT  10/25/2020   Procedure: HEMOSTASIS CLIP PLACEMENT;  Surgeon: Irving Copas., MD;  Location: [place];  Service: Gastroenterology;;   HERNIA REPAIR     LEFT HEART CATHETERIZATION WITH CORONARY ANGIOGRAM Right 03/07/2013   Procedure: LEFT HEART CATHETERIZATION WITH CORONARY ANGIOGRAM;  Surgeon: [name], MD;  Location: [facility] CATH LAB;  Service: Cardiovascular;  Laterality: Right;   UMBILICAL HERNIA REPAIR     UPPER GASTROINTESTINAL ENDOSCOPY     Social History:  reports that he has been smoking cigarettes and e-cigarettes. He has a 36.00 pack-year smoking history. He has never used smokeless tobacco. He reports that he does not currently use alcohol. He reports that he does not currently use drugs after having used the following drugs: Marijuana and Cocaine.  Allergies  Allergen Reactions   Robaxin [Methocarbamol] Other (See Comments)    "jumpy limbs"   Aspirin Other (See Comments)    Unknown reaction   Shellfish-Derived Products Nausea And Vomiting and Rash   Trazodone And Nefazodone Other (See Comments)    Muscle spasms   Adhesive [Tape] Itching   Latex Itching   Toradol  [Ketorolac Tromethamine] Other (See Comments)    Has ulcers; cannot have this   Contrast Media [Iodinated Contrast Media] Hives   Reglan [Metoclopramide] Other (See Comments)    Muscle spasms   Salmon [Fish Oil] Nausea And Vomiting and Rash    Family History  Problem Relation Age of Onset   Hypertension Mother    Cirrhosis Father    Alcoholism Father    Hypertension Father    Melanoma Father    Hypertension Other    Coronary artery disease Other     Prior to Admission medications   Medication Sig Start Date End Date Taking? Authorizing Provider  acetaminophen (TYLENOL) 500 MG tablet Take 500 mg by mouth every 6 (six) hours as needed for moderate pain or headache.   Yes [provider]  albuterol (VENTOLIN HFA) 108 (90 Base) MCG/ACT inhaler Inhale 2 puffs into the lungs every 4 (four) hours as needed for wheezing or shortness of breath. 08/17/21  Yes [name], MD  folic acid (FOLVITE) 1 MG tablet Take 1 tablet (1 mg total) by mouth daily. 11/26/17  Yes [name], MD  lipase/protease/amylase (  CREON) 36000 UNITS CPEP capsule Take 2 capsules (72,000 Units total) by mouth 3 (three) times daily with meals. 10/12/21  Yes Willia Craze, NP  magnesium oxide (MAG-OX) 400 (240 Mg) MG tablet Take 1 tablet (400 mg total) by mouth daily. 11/30/21  Yes Davonna Belling, MD  metoprolol (TOPROL-XL) 200 MG 24 hr tablet Take 100 mg by mouth daily. 10/29/20  Yes [provider]  Multiple Vitamin (MULTIVITAMIN WITH MINERALS) TABS tablet Take 1 tablet by mouth daily. 09/06/17  Yes Bonnell Public, MD  omeprazole (PRILOSEC) 40 MG capsule Take 1 capsule (40 mg total) by mouth 2 (two) times daily before a meal. 08/18/21  Yes [name], MD  ondansetron (ZOFRAN) 4 MG tablet Take 1 tablet (4 mg total) by mouth every 6 (six) hours. 11/03/21  Yes Sherrell Puller, PA-C  Polyethyl Glycol-Propyl Glycol (SYSTANE OP) Place 1 drop into both eyes 4 (four) times daily as needed (for  dryness).   Yes [provider]  promethazine (PHENERGAN) 25 MG tablet Take 25 mg by mouth every 8 (eight) hours as needed. 11/19/21  Yes [provider]  sucralfate (CARAFATE) 1 g tablet Take 1 tablet (1 g total) by mouth 4 (four) times daily -  with meals and at bedtime. 10/12/21  Yes Willia Craze, NP  thiamine 100 MG tablet Take 1 tablet (100 mg total) by mouth daily. 10/16/18  Yes [name], MD  verapamil (CALAN-SR) 120 MG CR tablet Take 120 mg by mouth at bedtime.   Yes [provider]  Vitamin D, Ergocalciferol, (DRISDOL) 1.25 MG (50000 UNIT) CAPS capsule Take 50,000 Units by mouth every Wednesday.   Yes [provider]  ondansetron (ZOFRAN-ODT) 4 MG disintegrating tablet Take 1 tablet (4 mg total) by mouth every 8 (eight) hours as needed for vomiting or nausea. Patient not taking: Reported on 12/06/2021 08/17/21   [name], MD  amitriptyline (ELAVIL) 25 MG tablet Take 1 tablet (25 mg total) by mouth at bedtime. Patient not taking: Reported on 08/08/2019 10/15/18 08/08/19  [name], MD    Physical Exam: Vitals:   12/06/21 2034 12/06/21 2050 12/06/21 2100 12/06/21 2323  BP:  (!) 151/98 115/70 (!) 143/92  Pulse:  71    Resp:  '12 12 13  '$ Temp: 97.9 F (36.6 C)   97.6 F (36.4 C)  TempSrc: Oral   Oral  SpO2:  100% 100%   Weight:   62.3 kg   Height:   '5\' 8"'$  (1.727 m)    Constitutional: NAD, calm, comfortable, thin cachectic male laying flat in bed asleep.  Awoke easily to voice. Eyes: lids and conjunctivae normal ENMT: Mucous membranes are moist.  Neck: normal, supple,  Respiratory: clear to auscultation bilaterally, no wheezing, no crackles. Normal respiratory effort. No accessory muscle use.  Cardiovascular: Regular rate and rhythm, no murmurs / rubs / gallops. No extremity edema.  Abdomen: Soft, nondistended, mild epigastric tenderness on palpation, bowel sounds positive.  Musculoskeletal: no clubbing / cyanosis. No joint deformity  upper and lower extremities. Good ROM, no contractures. Normal muscle tone.  Skin: no rashes, lesions, ulcers.  Neurologic: CN 2-12 grossly intact.  Strength 5/5 in all 4.  Psychiatric: Normal judgment and insight. Alert and oriented x 3. Normal mood. Data Reviewed:  See HPI  Assessment and Plan: * Paroxysmal atrial fibrillation with RVR ([place]) Hx of WPW reportedly with ablation-not able to find records. Here today with atrial fibrillation with aberrancy. -Has been off his metoprolol  XL '100mg'$  for at least a week -has been seen by cardiology and has home '100mg'$  Toprol XL ordered although cardiology thinks it could go up to '200mg'$ . I discussed to verify final dose with on call cardiology Dr. Harl Bowie who will verfiy.Keeping on '100mg'$  for one. Has been off cocaine for 6 weeks and UDS is negative here so okay to continue.  Pulmonary nodule 3 mm left upper lobe nodule incidental finding on CTA-recommend 1 year follow-up  Abdominal pain Recurrent pancreatitis vs possible gastritis -will obtain GI stool panel  -resume Carafate -continue omeprazole -continuous IV fluid overnight -keep on full liquid diet for bowel rest overnight and then advance as tolerated  Alcohol use disorder, severe, dependence ([place]) Reports normally checking 6-12 beers daily.  Last had reportedly 1 beer  About 2 days ago. -Placed on CIWA protocol  Essential hypertension Controlled. Continue on beta-blocker.      Advance Care Planning:   Code Status: Full Code Full  Consults: Cardiology  Family Communication: none at bedside  Severity of Illness: The appropriate patient status for this patient is OBSERVATION. Observation status is judged to be reasonable and necessary in order to provide the required intensity of service to ensure the patient's safety. The patient's presenting symptoms, physical exam findings, and initial radiographic and laboratory data in the context of their medical condition is felt to place them  at decreased risk for further clinical deterioration. Furthermore, it is anticipated that the patient will be medically stable for discharge from the hospital within 2 midnights of admission.   Author: Orene Desanctis, DO 12/06/2021 11:55 PM  For on call review [email or web address].

## 2021-12-06 NOTE — ED Notes (Signed)
Pt back to treatment room, refuses to be placed back on the monitor, pt irritated as "all I need is pain medication" "In the hospital they can give me all sorts of pain medication even though I go to the pain clinic". Advised pt that provider is aware of his care plan and his regiments with the pain clinic.   Pt urinated in the toilet, not able to get a urine sample, pt st "I don't know why they need my urine anyway, I'm clean".   Pt given a cup of ice water for PO Challenge. IV infusions are maintained at this time

## 2021-12-06 NOTE — ED Notes (Signed)
PA-C Browning at the bedside

## 2021-12-06 NOTE — ED Notes (Signed)
PA-C Browning notified that pt is wanting to speak with him and requesting pain medication

## 2021-12-06 NOTE — ED Notes (Signed)
Tele transport placed and floor RN given the okay for pt to be transferred up.

## 2021-12-06 NOTE — Assessment & Plan Note (Signed)
Controlled. Continue on beta-blocker.

## 2021-12-06 NOTE — ED Notes (Signed)
Pt given a urinal, requesting a urine sample, pt verbalized understanding.

## 2021-12-06 NOTE — Assessment & Plan Note (Addendum)
Hx of WPW reportedly with ablation-not able to find records. Here today with atrial fibrillation with aberrancy. -Has been off his metoprolol XL '100mg'$  for at least a week -has been seen by cardiology and has home '100mg'$  Toprol XL ordered although cardiology thinks it could go up to '200mg'$ . I discussed to verify final dose with on call cardiology Dr. Harl Bowie who will verfiy.Keeping on '100mg'$  for one. Has been off cocaine for 6 weeks and UDS is negative here so okay to continue.

## 2021-12-06 NOTE — ED Notes (Signed)
Pt came out of room, cursing at staff, asking for some pain meds and real kitchen food. This RN informed the pt that his behavior is unacceptable. MD made aware.

## 2021-12-06 NOTE — ED Provider Notes (Signed)
Physical Exam  BP 130/88 (BP Location: Right Arm)   Pulse (!) 104   Temp 97.7 F (36.5 C) (Oral)   Resp 16   Ht '5\' 8"'$  (1.727 m)   Wt 61 kg   SpO2 99%   BMI 20.45 kg/m   Physical Exam  Procedures  Procedures  ED Course / MDM   Labs Reviewed  CBC WITH DIFFERENTIAL/PLATELET - Abnormal; Notable for the following components:      Result Value   Hemoglobin 11.7 (*)    HCT 38.5 (*)    RDW 22.9 (*)    Platelets 475 (*)    All other components within normal limits  COMPREHENSIVE METABOLIC PANEL - Abnormal; Notable for the following components:   Sodium 132 (*)    CO2 14 (*)    Glucose, Bld 104 (*)    All other components within normal limits  MAGNESIUM - Abnormal; Notable for the following components:   Magnesium 1.4 (*)    All other components within normal limits  D-DIMER, QUANTITATIVE - Abnormal; Notable for the following components:   D-Dimer, Quant 0.54 (*)    All other components within normal limits  LIPASE, BLOOD  RAPID URINE DRUG SCREEN, HOSP PERFORMED  TROPONIN I (HIGH SENSITIVITY)  TROPONIN I (HIGH SENSITIVITY)   EKG Interpretation  Date/Time:  Sunday December 05 2021 22:16:58 EDT Ventricular Rate:  109 PR Interval:  101 QRS Duration: 92 QT Interval:  288 QTC Calculation: 388 R Axis:   81 Text Interpretation: Sinus tachycardia Multiform ventricular premature complexes Consider right atrial enlargement Repol abnrm suggests ischemia, diffuse leads Confirmed by Addison Lank [id number]) on 12/06/2021 12:48:04 AM  CT Angio Chest PE W and/or Wo Contrast  Result Date: 12/06/2021 CLINICAL DATA:  Concern for pulmonary embolus EXAM: CT ANGIOGRAPHY CHEST WITH CONTRAST TECHNIQUE: Multidetector CT imaging of the chest was performed using the standard protocol during bolus administration of intravenous contrast. Multiplanar CT image reconstructions and MIPs were obtained to evaluate the vascular anatomy. RADIATION DOSE REDUCTION: This exam was performed according to the  departmental dose-optimization program which includes automated exposure control, adjustment of the mA and/or kV according to patient size and/or use of iterative reconstruction technique. CONTRAST:  25m OMNIPAQUE IOHEXOL 350 MG/ML SOLN COMPARISON:  Abdomen and pelvis CT dated November 02, 2021 FINDINGS: Cardiovascular: Normal heart size. No pericardial effusion. Normal caliber thoracic aorta with mild noncalcified plaque. No evidence of pulmonary embolus. Mediastinum/Nodes: Esophagus and thyroid are unremarkable. No pathologically enlarged lymph nodes seen in the chest. Lungs/Pleura: Central airways are patent. Moderate centrilobular emphysema. No consolidation, pleural effusion or pneumothorax. Small solid pulmonary nodule of the left upper lobe measuring 3 mm on series 8, image 60. Upper Abdomen: Cystic lesions of the pancreatic body and tail, unchanged when compared with recent abdomen and pelvis CT and likely pseudocysts. No acute abnormalities. Musculoskeletal: No chest wall abnormality. No acute or significant osseous findings. Review of the MIP images confirms the above findings. IMPRESSION: 1. No evidence of pulmonary embolus or acute airspace opacity. 2. Small solid pulmonary nodule of the left upper lobe measuring 3 mm. Recommend follow-up chest CT in 1 year if patient is high-risk.This recommendation follows the consensus statement: Guidelines for Management of Incidental Pulmonary Nodules Detected on CT Images: From the Fleischner Society 2017; Radiology 2017; 284:228-243. 3. Aortic Atherosclerosis (ICD10-I70.0) and Emphysema (ICD10-J43.9). Electronically Signed   By: LYetta GlassmanM.D.   On: 12/06/2021 13:21   DG Chest Port 1 View  Result Date: 12/05/2021 CLINICAL DATA:  Portable chest for chest pain. EXAM: PORTABLE CHEST 1 VIEW COMPARISON:  PA Lat 11/29/2021. FINDINGS: The heart size and mediastinal contours are within normal limits. Both lungs are clear. The visualized skeletal structures are  unremarkable apart from osteopenia. There is a tangle of overlying monitor wiring. IMPRESSION: No active disease.  Stable chest.  Osteopenia. Electronically Signed   By: Telford Nab M.D.   On: 12/05/2021 23:00     Medical Decision Making Amount and/or Complexity of Data Reviewed Labs: ordered. Radiology: ordered. ECG/medicine tests: ordered.  Risk OTC drugs. Prescription drug management.   Patient had an episode of tachycardia up to 160, does have underlying history of WPW.  Patient getting additional fluid bolus and D-dimer pending.  The rest of patient's lab evaluation has been reassuring.  D-dimer is slightly elevated.  We will proceed with CTA.  Patient has contrast allergy so will require premedication.  Called to bedside by nursing staff, patient in bed and shortly after receiving pain medication and had another episode of tachycardia with heart rates in the 190s.  Difficult to determine if this was more SVT versus A-fib given rapid heart rate, but heart rate quickly returned to the 120s over into the lower 100s.  Patient reports that he has had prior ablation for his WPW but also reports he is "due for another ablation".  I cannot find records of his ablation anywhere.  Patient is  I personally viewed and interpreted the CT scan with no evidence of PE there is a small pulmonary nodule noted.  Given that patient has no PE but has continued to have tachycardia will discuss with cardiology.  Case discussed with PA Sande Rives with cardiology, they will come and see and evaluate the patient to determine if there needs to be further intervention regarding tachycardia in the setting of WPW history.  If he needs admission they would request medicine admission, but patient has no other issues requiring admission so we will hold off until cardiology has seen and evaluated patient.  Patient tolerating p.o. without difficulty.  Continues to have heart rates in the low 100s but no  additional episodes of more severe tachycardia.  Pt is supposed to be taking Toprol XL daily, dose of home metoprolol ordered.  At shift change care signed out to Dr. Vanita Panda who will follow up on cardiology's recommendations and disposition appropriately.         Jacqlyn Larsen, PA-C 12/06/21 1530    Blanchie Dessert, MD 12/07/21 [phone]

## 2021-12-06 NOTE — Assessment & Plan Note (Signed)
Reports normally checking 6-12 beers daily.  Last had reportedly 1 beer  About 2 days ago. -Placed on CIWA protocol

## 2021-12-06 NOTE — ED Notes (Signed)
Lab called for recollect on pt, needs another green top d/t hemolysis

## 2021-12-06 NOTE — Assessment & Plan Note (Addendum)
Recurrent pancreatitis vs possible gastritis -will obtain GI stool panel  -resume Carafate -continue omeprazole -continuous IV fluid overnight -keep on full liquid diet for bowel rest overnight and then advance as tolerated

## 2021-12-06 NOTE — ED Notes (Signed)
Pt unhooked himself from the monitoring devices and IV pumps and is not seen in treatment room, pt's belongings are still in room, bookbag and phone seen on the floor. Assume pt went to the restroom

## 2021-12-06 NOTE — Consult Note (Addendum)
Cardiology Consultation:   Patient ID: [name] MRN: [id number]; DOB: [birth year]  Admit date: 12/05/2021 Date of Consult: 12/06/2021  PCP:  Merryl Hacker, No   [facility] Providers Cardiologist:  None        Patient Profile:   [name] is a 51 y.o. male with a hx of pancreatitis (ETOH induced) chronic abd pain,  hx urinary retention, + tobacco and ETOH use who is being seen 12/06/2021 for the evaluation of tachycardia up to 190 chest pain, dizziness abd pain and diarrhea at the request of Dr. Maryan Rued.  History of Present Illness:   [name] with above hx and Echo in 2022 with mild concentric LVH.  Has been seen in ER several times last for chest and abd pain.  He was seen in pain clinic for chronic pancreatitis.   Seen by Dr Verl Blalock in past and has had cardiac cath with no cardiac disease in 2014. Done by Dr. Doylene Canard  On EKG in 2014    He has preexcitation on his EKG with ST segment changes consistent with WPW. I do not see any EP notes on pt. Pt tells me he has WPW and underwent ablation here at [facility]. 2014 or 2015.   He has done well until recently and has been having rapid HR sometimes to 190 but mostly to 135.  With the episodes he will have associated  symptoms.  He has been using cocaine but none for last 6 weeks. Some Marijuana.  He is on verapamil.     Here he reported pain for a week.  Has been off drugs for 2 months in attempt to get back into pain management. He continues to use ETOH and has chest pains and racing HR with exertion.    EKG:  The EKG was personally reviewed and demonstrates:  ST with LVH and T wave inversions lat leads.   EKG from 10/20/17 with non specific T wave abnormality has replaced inverted  T waves in inf leads and T wave inversion less evident in ant. Leads.  Telemetry:  Telemetry was personally reviewed and demonstrates:  HR to 190 at least.  Appears possible atrial fib.  Some wide complex   Na 132, K+ 4.2 BUN 9 Cr 0.95 LFTs WNL Hs  troponin 7 & 5  WBC 9.5 Hgb 11.7 plts 475  Drug screen neg.  PCXR No active disease.  Stable chest.  Osteopenia. CTA of chest  with no PE, small solid pulmonary nodule of lt upper lobe, aortic atherosclerosis and emphysema.    BP 150-102 P 53 to 128  on tele.  HR up to 190   Past Medical History:  Diagnosis Date   Alcoholism /alcohol abuse    Anemia    Anxiety    Arthritis    "knees; arms; elbows" (03/26/2015)   Asthma    Bipolar disorder (HCC)    Chronic bronchitis (HCC)    Chronic lower back pain    Chronic pancreatitis ([place])    Cocaine abuse ([place])    Depression    Family history of adverse reaction to anesthesia    "grandmother gets confused"   Femoral condyle fracture ([place]) 03/08/2014   left medial/notes 03/09/2014   GERD (gastroesophageal reflux disease)    H/O hiatal hernia    H/O suicide attempt 10/2012   High cholesterol    History of blood transfusion 10/2012   "when I tried to commit suicide"   History of stomach ulcers    Hypertension  Marijuana abuse, continuous    Migraine    "a few times/year" (03/26/2015)   Pneumonia 1990's X 3   PTSD (post-traumatic stress disorder)    Sickle cell trait ([place])    WPW (Wolff-Parkinson-White syndrome)    [name] 03/06/2013    Past Surgical History:  Procedure Laterality Date   BIOPSY  11/25/2017   Procedure: BIOPSY;  Surgeon: Arta Silence, MD;  Location[facility] ENDOSCOPY;  Service: Endoscopy;;   BIOPSY  10/14/2018   Procedure: BIOPSY;  Surgeon: Arta Silence, MD;  Location: [place];  Service: Endoscopy;;   CARDIAC CATHETERIZATION     CYST ENTEROSTOMY  01/02/2020   Procedure: CYST ASPIRATION;  Surgeon: Milus Banister, MD;  Location: [facility] ENDOSCOPY;  Service: Endoscopy;;   ESOPHAGOGASTRODUODENOSCOPY (EGD) WITH PROPOFOL N/A 11/25/2017   Procedure: ESOPHAGOGASTRODUODENOSCOPY (EGD) WITH PROPOFOL;  Surgeon: Arta Silence, MD;  Location[facility] ENDOSCOPY;  Service: Endoscopy;  Laterality: N/A;   ESOPHAGOGASTRODUODENOSCOPY  (EGD) WITH PROPOFOL Left 10/14/2018   Procedure: ESOPHAGOGASTRODUODENOSCOPY (EGD) WITH PROPOFOL;  Surgeon: Arta Silence, MD;  Location: [facility] ENDOSCOPY;  Service: Endoscopy;  Laterality: Left;   ESOPHAGOGASTRODUODENOSCOPY (EGD) WITH PROPOFOL N/A 11/14/2018   Procedure: ESOPHAGOGASTRODUODENOSCOPY (EGD) WITH PROPOFOL;  Surgeon: Laurence Spates, MD;  Location: [facility] ENDOSCOPY;  Service: Gastroenterology;  Laterality: N/A;   ESOPHAGOGASTRODUODENOSCOPY (EGD) WITH PROPOFOL N/A 01/02/2020   Procedure: ESOPHAGOGASTRODUODENOSCOPY (EGD) WITH PROPOFOL;  Surgeon: Milus Banister, MD;  Location: [facility] ENDOSCOPY;  Service: Endoscopy;  Laterality: N/A;   ESOPHAGOGASTRODUODENOSCOPY (EGD) WITH PROPOFOL N/A 10/25/2020   Procedure: ESOPHAGOGASTRODUODENOSCOPY (EGD) WITH PROPOFOL;  Surgeon: Rush Landmark Telford Nab., MD;  Location: [place];  Service: Gastroenterology;  Laterality: N/A;   EUS N/A 01/02/2020   Procedure: UPPER ENDOSCOPIC ULTRASOUND (EUS) RADIAL;  Surgeon: Milus Banister, MD;  Location: [facility] ENDOSCOPY;  Service: Endoscopy;  Laterality: N/A;   EYE SURGERY Left 1990's   "result of trauma"    FACIAL FRACTURE SURGERY Left 1990's   "result of trauma"    FLEXIBLE SIGMOIDOSCOPY N/A 10/25/2020   Procedure: FLEXIBLE SIGMOIDOSCOPY;  Surgeon: Irving Copas., MD;  Location: [place];  Service: Gastroenterology;  Laterality: N/A;   FRACTURE SURGERY     HEMOSTASIS CLIP PLACEMENT  10/25/2020   Procedure: HEMOSTASIS CLIP PLACEMENT;  Surgeon: Irving Copas., MD;  Location: [place];  Service: Gastroenterology;;   HERNIA REPAIR     LEFT HEART CATHETERIZATION WITH CORONARY ANGIOGRAM Right 03/07/2013   Procedure: LEFT HEART CATHETERIZATION WITH CORONARY ANGIOGRAM;  Surgeon: [name], MD;  Location: [place] CATH LAB;  Service: Cardiovascular;  Laterality: Right;   UMBILICAL HERNIA REPAIR     UPPER GASTROINTESTINAL ENDOSCOPY       Home Medications:  Prior to Admission medications   Medication Sig Start  Date End Date Taking? Authorizing Provider  acetaminophen (TYLENOL) 500 MG tablet Take 500 mg by mouth every 6 (six) hours as needed for moderate pain or headache.   Yes [provider]  albuterol (VENTOLIN HFA) 108 (90 Base) MCG/ACT inhaler Inhale 2 puffs into the lungs every 4 (four) hours as needed for wheezing or shortness of breath. 08/17/21  Yes [name], MD  folic acid (FOLVITE) 1 MG tablet Take 1 tablet (1 mg total) by mouth daily. 11/26/17  Yes [name], MD  lipase/protease/amylase (CREON) 36000 UNITS CPEP capsule Take 2 capsules (72,000 Units total) by mouth 3 (three) times daily with meals. 10/12/21  Yes Willia Craze, NP  magnesium oxide (MAG-OX) 400 (240 Mg) MG tablet Take 1 tablet (400 mg total) by  mouth daily. 11/30/21  Yes Davonna Belling, MD  metoprolol (TOPROL-XL) 200 MG 24 hr tablet Take 100 mg by mouth daily. 10/29/20  Yes [provider]  Multiple Vitamin (MULTIVITAMIN WITH MINERALS) TABS tablet Take 1 tablet by mouth daily. 09/06/17  Yes Bonnell Public, MD  omeprazole (PRILOSEC) 40 MG capsule Take 1 capsule (40 mg total) by mouth 2 (two) times daily before a meal. 08/18/21  Yes [name], MD  ondansetron (ZOFRAN) 4 MG tablet Take 1 tablet (4 mg total) by mouth every 6 (six) hours. 11/03/21  Yes Sherrell Puller, PA-C  Polyethyl Glycol-Propyl Glycol (SYSTANE OP) Place 1 drop into both eyes 4 (four) times daily as needed (for dryness).   Yes [provider]  promethazine (PHENERGAN) 25 MG tablet Take 25 mg by mouth every 8 (eight) hours as needed. 11/19/21  Yes [provider]  sucralfate (CARAFATE) 1 g tablet Take 1 tablet (1 g total) by mouth 4 (four) times daily -  with meals and at bedtime. 10/12/21  Yes Willia Craze, NP  thiamine 100 MG tablet Take 1 tablet (100 mg total) by mouth daily. 10/16/18  Yes [name], MD  verapamil (CALAN-SR) 120 MG CR tablet Take 120 mg by mouth at bedtime.   Yes [provider]  Vitamin D, Ergocalciferol, (DRISDOL) 1.25 MG (50000 UNIT) CAPS capsule Take 50,000 Units by mouth every Wednesday.   Yes [provider]  ondansetron (ZOFRAN-ODT) 4 MG disintegrating tablet Take 1 tablet (4 mg total) by mouth every 8 (eight) hours as needed for vomiting or nausea. Patient not taking: Reported on 12/06/2021 08/17/21   [name], MD  amitriptyline (ELAVIL) 25 MG tablet Take 1 tablet (25 mg total) by mouth at bedtime. Patient not taking: Reported on 08/08/2019 10/15/18 08/08/19  [name], MD    Inpatient Medications: Scheduled Meds:  metoprolol succinate  100 mg Oral Daily   Continuous Infusions:  PRN Meds:   Allergies:    Allergies  Allergen Reactions   Robaxin [Methocarbamol] Other (See Comments)    "jumpy limbs"   Aspirin Other (See Comments)    Unknown reaction   Shellfish-Derived Products Nausea And Vomiting and Rash   Trazodone And Nefazodone Other (See Comments)    Muscle spasms   Adhesive [Tape] Itching   Latex Itching   Toradol [Ketorolac Tromethamine] Other (See Comments)    Has ulcers; cannot have this   Contrast Media [Iodinated Contrast Media] Hives   Reglan [Metoclopramide] Other (See Comments)    Muscle spasms   Salmon [Fish Oil] Nausea And Vomiting and Rash    Social History:   Social History   Socioeconomic History   Marital status: Single    Spouse name: Not on file   Number of children: Not on file   Years of education: Not on file   Highest education level: Not on file  Occupational History   Occupation: disabled  Tobacco Use   Smoking status: Every Day    Packs/day: 1.00    Years: 36.00    Total pack years: 36.00    Types: Cigarettes, E-cigarettes   Smokeless tobacco: Never  Vaping Use   Vaping Use: Former  Substance and Sexual Activity   Alcohol use: Not Currently    Comment: last drink a few days ago   Drug use: Not Currently    Types: Marijuana, Cocaine   Sexual activity: Yes    Birth  control/protection: None  Other Topics Concern  Not on file  Social History Narrative   ** Merged History Encounter **       Social Determinants of Health   Financial Resource Strain: Not on file  Food Insecurity: Not on file  Transportation Needs: Not on file  Physical Activity: Not on file  Stress: Not on file  Social Connections: Not on file  Intimate Partner Violence: Not on file    Family History:    Family History  Problem Relation Age of Onset   Hypertension Mother    Cirrhosis Father    Alcoholism Father    Hypertension Father    Melanoma Father    Hypertension Other    Coronary artery disease Other      ROS:  Please see the history of present illness.  General:no colds or fevers, no weight changes Skin:no rashes or ulcers HEENT:no blurred vision, no congestion CV:see HPI PUL:see HPI GI:no diarrhea constipation or melena, no indigestion GU:no hematuria, no dysuria MS:no joint pain, no claudication Neuro:no syncope, no lightheadedness Moran:no diabetes, no thyroid disease Psych bipolar disorder, polysubstance abuse  All other ROS reviewed and negative.     Physical Exam/Data:   Vitals:   12/06/21 0937 12/06/21 1030 12/06/21 1134 12/06/21 1200  BP: 130/88 131/79  (!) 150/102  Pulse: 100     Resp: '15 15  20  '$ Temp:   (!) 97.5 F (36.4 C)   TempSrc:      SpO2: 99%     Weight:      Height:        Intake/Output Summary (Last 24 hours) at 12/06/2021 1414 Last data filed at 12/06/2021 0328 Gross per 24 hour  Intake 1050 ml  Output --  Net 1050 ml      12/05/2021   10:14 PM 11/29/2021    2:35 PM 11/02/2021    4:11 PM  Last 3 Weights  Weight (lbs) 134 lb 7.7 oz 134 lb 7.7 oz 136 lb  Weight (kg) 61 kg 61 kg 61.689 kg     Body mass index is 20.45 kg/m.  General:  frail male, in no acute distress but concerned about condition HEENT: normal Neck: no JVD Vascular: No carotid bruits; Distal pulses 2+ bilaterally Cardiac:  normal S1, S2; RRR; no  murmur gallup rub or click Lungs:  clear to auscultation bilaterally, no wheezing, rhonchi or rales  Abd: soft, nontender, no hepatomegaly  Ext: no edema Musculoskeletal:  No deformities, BUE and BLE strength normal and equal Skin: warm and dry  Neuro:  alert and oriented X 3 MAE follows commands, no focal abnormalities noted Psych:  Normal affect    Relevant CV Studies: TTE at [facility] health 2020-06-26  There is mild concentric left ventricular hypertrophy.  Overall left ventricular systolic function is hyperdynamic with an EF >75%  No significant valvular abnormalities.     Laboratory Data:  High Sensitivity Troponin:   Recent Labs  Lab 11/29/21 1432 12/05/21 2250 12/06/21 0013  TROPONINIHS '6 7 5     '$ Chemistry Recent Labs  Lab 11/29/21 1432 11/30/21 0839 12/06/21 0013  NA 136  --  132*  K 4.0  --  4.2  CL 104  --  107  CO2 20*  --  14*  GLUCOSE 101*  --  104*  BUN <5*  --  9  CREATININE 0.83  --  0.95  CALCIUM 8.0*  --  9.3  MG  --  1.4* 1.4*  GFRNONAA >60  --  >60  ANIONGAP 12  --  11    Recent Labs  Lab 11/29/21 1432 12/06/21 0013  PROT 7.1 7.9  ALBUMIN 3.4* 3.8  AST 25 28  ALT 14 18  ALKPHOS 93 101  BILITOT 0.2* 0.6   Lipids No results for input(s): "CHOL", "TRIG", "HDL", "LABVLDL", "LDLCALC", "CHOLHDL" in the last 168 hours.  Hematology Recent Labs  Lab 11/29/21 1432 12/05/21 2250  WBC 6.9 9.5  RBC 3.89* 4.50  HGB 10.4* 11.7*  HCT 33.4* 38.5*  MCV 85.9 85.6  MCH 26.7 26.0  MCHC 31.1 30.4  RDW 23.9* 22.9*  PLT 149* 475*   Thyroid No results for input(s): "TSH", "FREET4" in the last 168 hours.  BNPNo results for input(s): "BNP", "PROBNP" in the last 168 hours.  DDimer  Recent Labs  Lab 12/06/21 [phone]  DDIMER 0.54*     Radiology/Studies:  CT Angio Chest PE W and/or Wo Contrast  Result Date: 12/06/2021 CLINICAL DATA:  Concern for pulmonary embolus EXAM: CT ANGIOGRAPHY CHEST WITH CONTRAST TECHNIQUE: Multidetector CT imaging of the  chest was performed using the standard protocol during bolus administration of intravenous contrast. Multiplanar CT image reconstructions and MIPs were obtained to evaluate the vascular anatomy. RADIATION DOSE REDUCTION: This exam was performed according to the departmental dose-optimization program which includes automated exposure control, adjustment of the mA and/or kV according to patient size and/or use of iterative reconstruction technique. CONTRAST:  18m OMNIPAQUE IOHEXOL 350 MG/ML SOLN COMPARISON:  Abdomen and pelvis CT dated November 02, 2021 FINDINGS: Cardiovascular: Normal heart size. No pericardial effusion. Normal caliber thoracic aorta with mild noncalcified plaque. No evidence of pulmonary embolus. Mediastinum/Nodes: Esophagus and thyroid are unremarkable. No pathologically enlarged lymph nodes seen in the chest. Lungs/Pleura: Central airways are patent. Moderate centrilobular emphysema. No consolidation, pleural effusion or pneumothorax. Small solid pulmonary nodule of the left upper lobe measuring 3 mm on series 8, image 60. Upper Abdomen: Cystic lesions of the pancreatic body and tail, unchanged when compared with recent abdomen and pelvis CT and likely pseudocysts. No acute abnormalities. Musculoskeletal: No chest wall abnormality. No acute or significant osseous findings. Review of the MIP images confirms the above findings. IMPRESSION: 1. No evidence of pulmonary embolus or acute airspace opacity. 2. Small solid pulmonary nodule of the left upper lobe measuring 3 mm. Recommend follow-up chest CT in 1 year if patient is high-risk.This recommendation follows the consensus statement: Guidelines for Management of Incidental Pulmonary Nodules Detected on CT Images: From the Fleischner Society 2017; Radiology 2017; 284:228-243. 3. Aortic Atherosclerosis (ICD10-I70.0) and Emphysema (ICD10-J43.9). Electronically Signed   By: LYetta GlassmanM.D.   On: 12/06/2021 13:21   DG Chest Port 1 View  Result  Date: 12/05/2021 CLINICAL DATA:  Portable chest for chest pain. EXAM: PORTABLE CHEST 1 VIEW COMPARISON:  PA Lat 11/29/2021. FINDINGS: The heart size and mediastinal contours are within normal limits. Both lungs are clear. The visualized skeletal structures are unremarkable apart from osteopenia. There is a tangle of overlying monitor wiring. IMPRESSION: No active disease.  Stable chest.  Osteopenia. Electronically Signed   By: KTelford NabM.D.   On: 12/05/2021 23:00     Assessment and Plan:   SVT -irregular at times, with RVR to 190s at least.  Some with wide complex runs.  hx of WPW but all I see is EKG read as WPW excitation-- per pt he had ablation.  [facility] 120 and toprol XL 100 mg daily.  Dr. HPercival Spanish but prob admit to  [facility] with multiple issues and check echo and EP consult..  Chest pain, similar to ER visits for chronic pancreatitis. He is not in pain clinic currently - neg troponin, EKG without change.  Cardiac cath in 2014 without CAD. Chronic pancreatitis  pain may be related to SVT   Emphysema on CTA of chest.  And lung nodule - re-eval in 6 months.   Tobacco use pt down to 12 cigarettes per day and hopes to quit.  chronic pancreatitis and chronic abd pain.  Has stopped street drugs so he can go back to pain clinic.     Risk Assessment/Risk Scores:     HEAR Score (for undifferentiated chest pain):          For questions or updates, please contact [place] Please consult [email or web address] for contact info under    Signed, Cecilie Kicks, NP  12/06/2021 2:14 PM   History and all data above reviewed.  Patient examined.  I agree with the findings as above.   The patient reports a history of WPW with ablation although we cannot find this.  He has abdominal pain and presents primarily with this.   He says that if feels like his pancreatitis.  He has had diarrhea and anorexia.  He has had pain in the left mid and upper abdomen with radiation  to his lower and mid left chest.  He reports that he has had palpitations since November but they have increased in frequency and intensity.  He is able to check his HR with a BP cuff and he will see it at times in the 150s.  He will take it if his heart is racing.  Today in the ED he did have atrial fib with RVR and some aberrant conduction.  He converted spontaneously.  Of note he was on metoprolol XL but has been out of this for about one month.  The patient denies any new symptoms such as neck or arm discomfort. There has been no new shortness of breath, PND or orthopnea. There have been no reported palpitations, presyncope or syncope.  The patient exam reveals COR:   Regular rate and rhythm  ,  Lungs: Clear  ,  Abd: Positive bowel sounds, no rebound no guarding, Ext No edema  .  All available labs, radiology testing, previous records reviewed. Agree with documented assessment and plan. Atrial fib:  PAF with RVR.  Low Mr. Kollen Armenti has a CHA2DS2 - VASc score.   I don't think that he needs chronic anticoagulation.  He should be restarted on his beta blocker and he would likely tolerate 200 mg XL daily.  He does have a history of cocaine use but was tolerating beta blocker in the past and had a negative drug screen and reports being off drugs other than ETOH for six weeks or longer.  Chest pain:  No evidence of ischemia.  Non anginal pain.    [name]  4:59 PM  12/06/2021

## 2021-12-06 NOTE — ED Notes (Signed)
Reminded pt of urine sample pt reports "I can't pee, I haven't had any water" Reminded pt of the cup I gave him of ice water he drank and the liter of fluids, this pt has urinated as this RN seen in the BR when pt went after unhooking himself from monitoring devices and IV fluids as he forgot to flush the toilet upon his return. PA-C Marlon Pel is aware of this finding as well.

## 2021-12-06 NOTE — Assessment & Plan Note (Signed)
3 mm left upper lobe nodule incidental finding on CTA-recommend 1 year follow-up

## 2021-12-07 ENCOUNTER — Observation Stay (HOSPITAL_COMMUNITY): Payer: Medicaid Other

## 2021-12-07 ENCOUNTER — Other Ambulatory Visit: Payer: Self-pay

## 2021-12-07 DIAGNOSIS — G40909 Epilepsy, unspecified, not intractable, without status epilepticus: Secondary | ICD-10-CM | POA: Diagnosis present

## 2021-12-07 DIAGNOSIS — F1410 Cocaine abuse, uncomplicated: Secondary | ICD-10-CM | POA: Diagnosis present

## 2021-12-07 DIAGNOSIS — I4891 Unspecified atrial fibrillation: Secondary | ICD-10-CM

## 2021-12-07 DIAGNOSIS — F319 Bipolar disorder, unspecified: Secondary | ICD-10-CM | POA: Diagnosis present

## 2021-12-07 DIAGNOSIS — I10 Essential (primary) hypertension: Secondary | ICD-10-CM

## 2021-12-07 DIAGNOSIS — I480 Paroxysmal atrial fibrillation: Secondary | ICD-10-CM

## 2021-12-07 DIAGNOSIS — R911 Solitary pulmonary nodule: Secondary | ICD-10-CM

## 2021-12-07 DIAGNOSIS — J439 Emphysema, unspecified: Secondary | ICD-10-CM | POA: Diagnosis present

## 2021-12-07 DIAGNOSIS — K852 Alcohol induced acute pancreatitis without necrosis or infection: Secondary | ICD-10-CM | POA: Diagnosis not present

## 2021-12-07 DIAGNOSIS — E861 Hypovolemia: Secondary | ICD-10-CM | POA: Diagnosis present

## 2021-12-07 DIAGNOSIS — K8681 Exocrine pancreatic insufficiency: Secondary | ICD-10-CM | POA: Diagnosis present

## 2021-12-07 DIAGNOSIS — K861 Other chronic pancreatitis: Secondary | ICD-10-CM | POA: Diagnosis not present

## 2021-12-07 DIAGNOSIS — I456 Pre-excitation syndrome: Secondary | ICD-10-CM

## 2021-12-07 DIAGNOSIS — F1020 Alcohol dependence, uncomplicated: Secondary | ICD-10-CM

## 2021-12-07 DIAGNOSIS — E8809 Other disorders of plasma-protein metabolism, not elsewhere classified: Secondary | ICD-10-CM | POA: Diagnosis present

## 2021-12-07 DIAGNOSIS — D573 Sickle-cell trait: Secondary | ICD-10-CM | POA: Diagnosis present

## 2021-12-07 DIAGNOSIS — K2920 Alcoholic gastritis without bleeding: Secondary | ICD-10-CM | POA: Diagnosis present

## 2021-12-07 DIAGNOSIS — R64 Cachexia: Secondary | ICD-10-CM | POA: Diagnosis present

## 2021-12-07 DIAGNOSIS — E872 Acidosis, unspecified: Secondary | ICD-10-CM | POA: Diagnosis present

## 2021-12-07 DIAGNOSIS — K859 Acute pancreatitis without necrosis or infection, unspecified: Secondary | ICD-10-CM | POA: Diagnosis present

## 2021-12-07 DIAGNOSIS — R1084 Generalized abdominal pain: Secondary | ICD-10-CM | POA: Diagnosis not present

## 2021-12-07 DIAGNOSIS — N179 Acute kidney failure, unspecified: Secondary | ICD-10-CM | POA: Diagnosis present

## 2021-12-07 DIAGNOSIS — D509 Iron deficiency anemia, unspecified: Secondary | ICD-10-CM | POA: Diagnosis present

## 2021-12-07 DIAGNOSIS — Z681 Body mass index (BMI) 19 or less, adult: Secondary | ICD-10-CM | POA: Diagnosis not present

## 2021-12-07 DIAGNOSIS — R1013 Epigastric pain: Secondary | ICD-10-CM | POA: Diagnosis not present

## 2021-12-07 DIAGNOSIS — K860 Alcohol-induced chronic pancreatitis: Secondary | ICD-10-CM | POA: Diagnosis present

## 2021-12-07 DIAGNOSIS — K863 Pseudocyst of pancreas: Secondary | ICD-10-CM | POA: Diagnosis present

## 2021-12-07 DIAGNOSIS — E43 Unspecified severe protein-calorie malnutrition: Secondary | ICD-10-CM | POA: Diagnosis present

## 2021-12-07 DIAGNOSIS — I471 Supraventricular tachycardia: Secondary | ICD-10-CM | POA: Diagnosis present

## 2021-12-07 LAB — GASTROINTESTINAL PANEL BY PCR, STOOL (REPLACES STOOL CULTURE)

## 2021-12-07 LAB — BASIC METABOLIC PANEL
Anion gap: 14 (ref 5–15)
BUN: 16 mg/dL (ref 6–20)
CO2: 16 mmol/L — ABNORMAL LOW (ref 22–32)
Calcium: 9.3 mg/dL (ref 8.9–10.3)
Chloride: 102 mmol/L (ref 98–111)
Creatinine, Ser: 1.17 mg/dL (ref 0.61–1.24)
GFR, Estimated: >60 mL/min (ref >60)
Glucose, Bld: 95 mg/dL (ref 70–99)
Potassium: 4 mmol/L (ref 3.5–5.1)
Sodium: 132 mmol/L — ABNORMAL LOW (ref 135–145)

## 2021-12-07 LAB — HIV ANTIBODY (ROUTINE TESTING W REFLEX): HIV Screen 4th Generation wRfx: NONREACTIVE

## 2021-12-07 LAB — CBC
HCT: 35.1 % — ABNORMAL LOW (ref 39.0–52.0)
Hemoglobin: 10.9 g/dL — ABNORMAL LOW (ref 13.0–17.0)
MCH: 25.8 pg — ABNORMAL LOW (ref 26.0–34.0)
MCHC: 31.1 g/dL (ref 30.0–36.0)
MCV: 83.2 fL (ref 80.0–100.0)
Platelets: 516 10*3/uL — ABNORMAL HIGH (ref 150–400)
RBC: 4.22 MIL/uL (ref 4.22–5.81)
RDW: 22.5 % — ABNORMAL HIGH (ref 11.5–15.5)
WBC: 13.6 10*3/uL — ABNORMAL HIGH (ref 4.0–10.5)
nRBC: 0 % (ref 0.0–0.2)

## 2021-12-07 LAB — ECHOCARDIOGRAM COMPLETE
AR max vel: 2.45 cm2
AV Peak grad: 6.9 mmHg
Ao pk vel: 1.32 m/s
Area-P 1/2: 4.15 cm2
Height: 68 in
S' Lateral: 2.2 cm
Weight: 2197.55 oz

## 2021-12-07 LAB — C DIFFICILE QUICK SCREEN W PCR REFLEX
C Diff antigen: NEGATIVE
C Diff interpretation: NOT DETECTED
C Diff toxin: NEGATIVE

## 2021-12-07 MED ORDER — NICOTINE 21 MG/24HR TD PT24
21.0000 mg | MEDICATED_PATCH | Freq: Every day | TRANSDERMAL | Status: DC
  Administered 2021-12-07 – 2021-12-10 (×4): 21 mg via TRANSDERMAL
  Filled 2021-12-07 (×4): qty 1

## 2021-12-07 MED ORDER — HYDROMORPHONE HCL 1 MG/ML IJ SOLN
1.0000 mg | INTRAMUSCULAR | Status: DC | PRN
  Administered 2021-12-07 (×2): 1 mg via INTRAVENOUS
  Administered 2021-12-07 – 2021-12-08 (×2): 2 mg via INTRAVENOUS
  Administered 2021-12-08: 1 mg via INTRAVENOUS
  Administered 2021-12-08 – 2021-12-15 (×52): 2 mg via INTRAVENOUS
  Administered 2021-12-16: 1 mg via INTRAVENOUS
  Administered 2021-12-16 (×5): 2 mg via INTRAVENOUS
  Administered 2021-12-16: 1 mg via INTRAVENOUS
  Administered 2021-12-16 – 2021-12-20 (×26): 2 mg via INTRAVENOUS
  Filled 2021-12-07 (×8): qty 2
  Filled 2021-12-07: qty 1
  Filled 2021-12-07 (×24): qty 2
  Filled 2021-12-07: qty 1
  Filled 2021-12-07 (×15): qty 2
  Filled 2021-12-07 (×3): qty 1
  Filled 2021-12-07 (×9): qty 2
  Filled 2021-12-07: qty 1
  Filled 2021-12-07 (×24): qty 2
  Filled 2021-12-07: qty 1
  Filled 2021-12-07 (×5): qty 2

## 2021-12-07 MED ORDER — LACTATED RINGERS IV SOLN
INTRAVENOUS | Status: DC
Start: 2021-12-07 — End: 2021-12-08

## 2021-12-07 MED ORDER — LOPERAMIDE HCL 2 MG PO CAPS
2.0000 mg | ORAL_CAPSULE | ORAL | Status: DC | PRN
Start: 2021-12-07 — End: 2021-12-24
  Administered 2021-12-07 – 2021-12-20 (×12): 2 mg via ORAL
  Filled 2021-12-07 (×11): qty 1

## 2021-12-07 NOTE — Progress Notes (Signed)
Progress Note  Patient: Marie Chow DZH:[id number] DOB: [birth year]  DOA: 12/05/2021  DOS: 12/07/2021    Brief hospital course: Yani Obadiah Dennard is a 51 y.o. male with a history of WPW s/p ablation, substance use, alcoholic pancreatitis, HTN, and seizure disorder who presented to the ED with abdominal pain, nausea and vomiting as well as more acute chest heaviness and dyspnea. He has continued drinking alcohol. He was afebrile in the ED with newly diagnosed atrial fibrillation with RVR with some aberrancy. CTA chest was negative for PE, showed stable fluid collections, suspected pseudocysts near pancreas. He was admitted for recurrent acute on chronic pancreatitis with suspected hypovloemia-induced SVT, and brief AFib that has converted.   Assessment and Plan: Paroxysmal atrial fibrillation with RVR:  - Brief, low cardioembolic risk and no ongoing anticoagulation recommended per cardiology.  - Given history of WPW, said to be s/p ablation, cardiology was consulted, recommends continuing beta blocker and arranging cardiac monitor at discharge.  - Continue metoprolol succinate '100mg'$    Acute on chronic alcoholic pancreatitis:  - Deescalate diet as he's got refractory nausea and vomiting with abdominal pain. Can have ice chips. Continue IVF.  - Has required hospitalization in the past for this, last was April 2023, usually requires several days. Will provide with IV dilaudid and IV antiemetics for now.  - Diarrhea noted, will restart creon. Work up included C diff and GI pathogen panel which were negative. Will give imodium.   Pulmonary nodule: 3 mm in LUL.  - Recommend 1 year follow-up  Alcohol use disorder, severe, dependence: Reports normally checking 6-12 beers daily.  Last had reportedly 1 beer  About 2 days ago. - Continue CIWA protocol  Essential hypertension - Continue metoprolol.  Tobacco user:  - Cessation counseling, nicotine patch ordered.  Subjective: Having some  improvement in chest discomfort, dizziness, heart pounding feeling, though HR still into 130's intermittently. Tried to eat grits, had vomiting and return of abdominal burning pain.   Objective: Vitals:   12/07/21 0419 12/07/21 0809 12/07/21 1223 12/07/21 1606  BP: 126/66 116/77 116/85 104/72  Pulse:   75 70  Resp: '12 16 14 12  '$ Temp: 98.1 F (36.7 C) 97.6 F (36.4 C) 98 F (36.7 C) 97.7 F (36.5 C)  TempSrc: Oral Oral Oral Oral  SpO2:  99% 100% 97%  Weight:      Height:       Gen: 51 y.o. male in no distress Pulm: Nonlabored breathing room air. Clear CV: Regular with frequent PACs/PVCs [facility] w/aberrancy?), no murmur, rub, or gallop. No JVD, no dependent edema. GI: Abdomen soft, tender without rebound or guarding in epigastrium and LUQ. Non-distended, with normoactive bowel sounds.  Ext: Warm, no deformities Skin: No rashes, lesions or ulcers on visualized skin. Neuro: Alert and oriented. No focal neurological deficits. Psych: Judgement and insight appear fair. Mood euthymic & affect congruent. Behavior is appropriate.    Data Personally reviewed: CBC: Recent Labs  Lab 12/05/21 2250 12/07/21 0048  WBC 9.5 13.6*  NEUTROABS 5.4  --   HGB 11.7* 10.9*  HCT 38.5* 35.1*  MCV 85.6 83.2  PLT 475* 419*   Basic Metabolic Panel: Recent Labs  Lab 12/06/21 0013 12/07/21 0048  NA 132* 132*  K 4.2 4.0  CL 107 102  CO2 14* 16*  GLUCOSE 104* 95  BUN 9 16  CREATININE 0.95 1.17  CALCIUM 9.3 9.3  MG 1.4*  --    GFR: Estimated Creatinine Clearance: 65.1 mL/min (by C-G formula based  on SCr of 1.17 mg/dL). Liver Function Tests: Recent Labs  Lab 12/06/21 0013  AST 28  ALT 18  ALKPHOS 101  BILITOT 0.6  PROT 7.9  ALBUMIN 3.8   Recent Labs  Lab 12/06/21 0013  LIPASE 23   No results for input(s): "AMMONIA" in the last 168 hours. Coagulation Profile: No results for input(s): "INR", "PROTIME" in the last 168 hours. Cardiac Enzymes: No results for input(s): "CKTOTAL",  "CKMB", "CKMBINDEX", "TROPONINI" in the last 168 hours. BNP (last 3 results) No results for input(s): "PROBNP" in the last 8760 hours. HbA1C: No results for input(s): "HGBA1C" in the last 72 hours. CBG: No results for input(s): "GLUCAP" in the last 168 hours. Lipid Profile: No results for input(s): "CHOL", "HDL", "LDLCALC", "TRIG", "CHOLHDL", "LDLDIRECT" in the last 72 hours. Thyroid Function Tests: No results for input(s): "TSH", "T4TOTAL", "FREET4", "T3FREE", "THYROIDAB" in the last 72 hours. Anemia Panel: No results for input(s): "VITAMINB12", "FOLATE", "FERRITIN", "TIBC", "IRON", "RETICCTPCT" in the last 72 hours. Urine analysis:    Component Value Date/Time   COLORURINE YELLOW 08/14/2021 1455   APPEARANCEUR CLEAR 08/14/2021 1455   LABSPEC 1.011 08/14/2021 1455   PHURINE 5.0 08/14/2021 1455   GLUCOSEU NEGATIVE 08/14/2021 1455   HGBUR NEGATIVE 08/14/2021 1455   HGBUR negative 04/30/2010 1020   BILIRUBINUR NEGATIVE 08/14/2021 1455   BILIRUBINUR negative 09/06/2019 1649   KETONESUR NEGATIVE 08/14/2021 1455   PROTEINUR NEGATIVE 08/14/2021 1455   UROBILINOGEN 0.2 09/06/2019 1649   UROBILINOGEN 0.2 03/15/2015 0508   NITRITE NEGATIVE 08/14/2021 1455   LEUKOCYTESUR NEGATIVE 08/14/2021 1455   Recent Results (from the past 240 hour(s))  Gastrointestinal Panel by PCR , Stool     Status: None   Collection Time: 12/07/21  2:23 AM   Specimen: Rectum; Stool  Result Value Ref Range Status   Campylobacter species NOT DETECTED NOT DETECTED Final   Plesimonas shigelloides NOT DETECTED NOT DETECTED Final   Salmonella species NOT DETECTED NOT DETECTED Final   Yersinia enterocolitica NOT DETECTED NOT DETECTED Final   Vibrio species NOT DETECTED NOT DETECTED Final   Vibrio cholerae NOT DETECTED NOT DETECTED Final   Enteroaggregative E coli (EAEC) NOT DETECTED NOT DETECTED Final   Enteropathogenic E coli (EPEC) NOT DETECTED NOT DETECTED Final   Enterotoxigenic E coli (ETEC) NOT DETECTED NOT  DETECTED Final   Shiga like toxin producing E coli (STEC) NOT DETECTED NOT DETECTED Final   Shigella/Enteroinvasive E coli (EIEC) NOT DETECTED NOT DETECTED Final   Cryptosporidium NOT DETECTED NOT DETECTED Final   Cyclospora cayetanensis NOT DETECTED NOT DETECTED Final   Entamoeba histolytica NOT DETECTED NOT DETECTED Final   Giardia lamblia NOT DETECTED NOT DETECTED Final   Adenovirus F40/41 NOT DETECTED NOT DETECTED Final   Astrovirus NOT DETECTED NOT DETECTED Final   Norovirus GI/GII NOT DETECTED NOT DETECTED Final   Rotavirus A NOT DETECTED NOT DETECTED Final   Sapovirus (I, II, IV, and V) NOT DETECTED NOT DETECTED Final    Comment: Performed at [facility], [address] [place], [place] [address]  C Difficile Quick Screen w PCR reflex     Status: None   Collection Time: 12/07/21  2:23 AM   Specimen: Rectum; Stool  Result Value Ref Range Status   C Diff antigen NEGATIVE NEGATIVE Final   C Diff toxin NEGATIVE NEGATIVE Final   C Diff interpretation No C. difficile detected.  Final    Comment: Performed at [facility], [place] [address], [place], [place] [address]  ECHOCARDIOGRAM COMPLETE  Result Date: 12/07/2021    ECHOCARDIOGRAM REPORT   Patient Name:   [facility] Date of Exam: 12/07/2021 Medical Rec #:  [id number]              Height:       68.0 in Accession #:    [id number]             Weight:       137.3 lb Date of Birth:  December 09, 1970               BSA:          1.742 m Patient Age:    62 years               BP:           116/77 mmHg Patient Gender: M                      HR:           68 bpm. Exam Location:  Inpatient Procedure: 2D Echo, Cardiac Doppler and Color Doppler Indications:    Atrial fibrillation  History:        Patient has prior history of Echocardiogram examinations, most                 recent 08/26/2019. Risk Factors:Hypertension.  Sonographer:    Jefferey Pica Referring Phys: [place]  1. Left ventricular ejection  fraction, by estimation, is 65 to 70%. The left ventricle has normal function. The left ventricle has no regional wall motion abnormalities. Left ventricular diastolic parameters are consistent with Grade I diastolic dysfunction (impaired relaxation).  2. Right ventricular systolic function is normal. The right ventricular size is normal. There is normal pulmonary artery systolic pressure.  3. The mitral valve is normal in structure. No evidence of mitral valve regurgitation. No evidence of mitral stenosis.  4. The aortic valve is tricuspid. Aortic valve regurgitation is not visualized. No aortic stenosis is present.  5. The inferior vena cava is normal in size with greater than 50% respiratory variability, suggesting right atrial pressure of 3 mmHg. Comparison(s): No significant change from prior study. FINDINGS  Left Ventricle: Left ventricular ejection fraction, by estimation, is 65 to 70%. The left ventricle has normal function. The left ventricle has no regional wall motion abnormalities. The left ventricular internal cavity size was normal in size. There is  no left ventricular hypertrophy. Left ventricular diastolic parameters are consistent with Grade I diastolic dysfunction (impaired relaxation). Right Ventricle: The right ventricular size is normal. No increase in right ventricular wall thickness. Right ventricular systolic function is normal. There is normal pulmonary artery systolic pressure. The tricuspid regurgitant velocity is 2.50 m/s, and  with an assumed right atrial pressure of 3 mmHg, the estimated right ventricular systolic pressure is 62.2 mmHg. Left Atrium: Left atrial size was normal in size. Right Atrium: Right atrial size was normal in size. Pericardium: Trivial pericardial effusion is present. Mitral Valve: The mitral valve is normal in structure. No evidence of mitral valve regurgitation. No evidence of mitral valve stenosis. Tricuspid Valve: The tricuspid valve is normal in structure.  Tricuspid valve regurgitation is trivial. No evidence of tricuspid stenosis. Aortic Valve: The aortic valve is tricuspid. Aortic valve regurgitation is not visualized. No aortic stenosis is present. Aortic valve peak gradient measures 6.9 mmHg. Pulmonic Valve: The pulmonic valve was normal in structure. Pulmonic valve regurgitation is not visualized. No evidence  of pulmonic stenosis. Aorta: The aortic root, ascending aorta and aortic arch are all structurally normal, with no evidence of dilitation or obstruction. Venous: The inferior vena cava is normal in size with greater than 50% respiratory variability, suggesting right atrial pressure of 3 mmHg. IAS/Shunts: No atrial level shunt detected by color flow Doppler.  LEFT VENTRICLE PLAX 2D LVIDd:         4.40 cm   Diastology LVIDs:         2.20 cm   LV e' lateral:   11.20 cm/s LV PW:         1.10 cm   LV E/e' lateral: 7.4 LV IVS:        0.80 cm LVOT diam:     1.90 cm LV SV:         55 LV SV Index:   31 LVOT Area:     2.84 cm  IVC IVC diam: 1.70 cm LEFT ATRIUM             Index        RIGHT ATRIUM          Index LA diam:        3.30 cm 1.89 cm/m   RA Area:     9.17 cm LA Vol (A2C):   28.0 ml 16.07 ml/m  RA Volume:   17.70 ml 10.16 ml/m LA Vol (A4C):   37.3 ml 21.41 ml/m LA Biplane Vol: 32.4 ml 18.60 ml/m  AORTIC VALVE                 PULMONIC VALVE AV Area (Vmax): 2.45 cm     PV Vmax:       0.91 m/s AV Vmax:        131.75 cm/s  PV Peak grad:  3.3 mmHg AV Peak Grad:   6.9 mmHg LVOT Vmax:      113.67 cm/s LVOT Vmean:     65.533 cm/s LVOT VTI:       0.193 m  AORTA Ao Root diam: 2.50 cm Ao Asc diam:  2.50 cm MITRAL VALVE               TRICUSPID VALVE MV Area (PHT): 4.15 cm    TR Peak grad:   25.0 mmHg MV Decel Time: 183 msec    TR Vmax:        250.00 cm/s MV E velocity: 83.20 cm/s MV A velocity: 85.90 cm/s  SHUNTS MV E/A ratio:  0.97        Systemic VTI:  0.19 m                            Systemic Diam: 1.90 cm Rudean Haskell MD Electronically signed by  Rudean Haskell MD Signature Date/Time: 12/07/2021/10:49:23 AM    Final    CT Angio Chest PE W and/or Wo Contrast  Result Date: 12/06/2021 CLINICAL DATA:  Concern for pulmonary embolus EXAM: CT ANGIOGRAPHY CHEST WITH CONTRAST TECHNIQUE: Multidetector CT imaging of the chest was performed using the standard protocol during bolus administration of intravenous contrast. Multiplanar CT image reconstructions and MIPs were obtained to evaluate the vascular anatomy. RADIATION DOSE REDUCTION: This exam was performed according to the departmental dose-optimization program which includes automated exposure control, adjustment of the mA and/or kV according to patient size and/or use of iterative reconstruction technique. CONTRAST:  31m OMNIPAQUE IOHEXOL 350 MG/ML SOLN COMPARISON:  Abdomen and pelvis CT dated November 02, 2021  FINDINGS: Cardiovascular: Normal heart size. No pericardial effusion. Normal caliber thoracic aorta with mild noncalcified plaque. No evidence of pulmonary embolus. Mediastinum/Nodes: Esophagus and thyroid are unremarkable. No pathologically enlarged lymph nodes seen in the chest. Lungs/Pleura: Central airways are patent. Moderate centrilobular emphysema. No consolidation, pleural effusion or pneumothorax. Small solid pulmonary nodule of the left upper lobe measuring 3 mm on series 8, image 60. Upper Abdomen: Cystic lesions of the pancreatic body and tail, unchanged when compared with recent abdomen and pelvis CT and likely pseudocysts. No acute abnormalities. Musculoskeletal: No chest wall abnormality. No acute or significant osseous findings. Review of the MIP images confirms the above findings. IMPRESSION: 1. No evidence of pulmonary embolus or acute airspace opacity. 2. Small solid pulmonary nodule of the left upper lobe measuring 3 mm. Recommend follow-up chest CT in 1 year if patient is high-risk.This recommendation follows the consensus statement: Guidelines for Management of Incidental  Pulmonary Nodules Detected on CT Images: From the Fleischner Society 2017; Radiology 2017; 284:228-243. 3. Aortic Atherosclerosis (ICD10-I70.0) and Emphysema (ICD10-J43.9). Electronically Signed   By: Yetta Glassman M.D.   On: 12/06/2021 13:21   DG Chest Port 1 View  Result Date: 12/05/2021 CLINICAL DATA:  Portable chest for chest pain. EXAM: PORTABLE CHEST 1 VIEW COMPARISON:  PA Lat 11/29/2021. FINDINGS: The heart size and mediastinal contours are within normal limits. Both lungs are clear. The visualized skeletal structures are unremarkable apart from osteopenia. There is a tangle of overlying monitor wiring. IMPRESSION: No active disease.  Stable chest.  Osteopenia. Electronically Signed   By: Telford Nab M.D.   On: 12/05/2021 23:00    Family Communication: None at bedside  Disposition: Status is: Inpatient Remains inpatient appropriate because: Requires IV fluids and medications for acute pancreatitis Planned Discharge Destination: Home      [name], MD 12/07/2021 4:36 PM Page by [email or web address]

## 2021-12-07 NOTE — Progress Notes (Signed)
Stool sample sent to lab for Gastrointestional Panel and C-Diff testing. Patient resting between diarrhea episodes. Stool is brown and watery. Enteric precautions initiated.

## 2021-12-07 NOTE — Progress Notes (Signed)
Progress Note  Patient Name: [name] Date of Encounter: 12/07/2021  Primary Cardiologist:   None   Subjective   Reports burning discomfort when he swallows.  Still with some abdominal pain but improved.  Diarrhea yesterday.   Inpatient Medications    Scheduled Meds:  enoxaparin (LOVENOX) injection  40 mg Subcutaneous Z36U   folic acid  1 mg Oral Daily   lipase/protease/amylase  72,000 Units Oral TID WC   magnesium oxide  400 mg Oral Daily   metoprolol succinate  100 mg Oral Daily   multivitamin with minerals  1 tablet Oral Daily   pantoprazole  40 mg Oral Daily   sucralfate  1 g Oral TID WC & HS   thiamine  100 mg Oral Daily   Or   thiamine  100 mg Intravenous Daily   Continuous Infusions:  PRN Meds: acetaminophen, HYDROmorphone (DILAUDID) injection, LORazepam **OR** LORazepam, morphine injection, ondansetron (ZOFRAN) IV   Vital Signs    Vitals:   12/06/21 2100 12/06/21 2323 12/07/21 0000 12/07/21 0419  BP: 115/70 (!) 143/92 116/78 126/66  Pulse:      Resp: '12 13 16 12  '$ Temp:  97.6 F (36.4 C)  98.1 F (36.7 C)  TempSrc:  Oral  Oral  SpO2: 100%     Weight: 62.3 kg     Height: '5\' 8"'$  (1.727 m)       Intake/Output Summary (Last 24 hours) at 12/07/2021 0754 Last data filed at 12/07/2021 0500 Gross per 24 hour  Intake 1096.63 ml  Output --  Net 1096.63 ml   Filed Weights   12/05/21 2214 12/06/21 2100  Weight: 61 kg 62.3 kg    Telemetry    NSR, PVCs, PACs, couplets.  No sustained arrhythmias - Personally Reviewed  ECG    NA - Personally Reviewed  Physical Exam   GEN: No acute distress.   Neck: No  JVD Cardiac:   RRR, no murmurs, rubs, or gallops.  Respiratory: Clear  to auscultation bilaterally. GI: Soft, nontender, non-distended  MS: No  edema; No deformity. Neuro:  Nonfocal  Psych: Normal affect   Labs    Chemistry Recent Labs  Lab 12/06/21 0013 12/07/21 0048  NA 132* 132*  K 4.2 4.0  CL 107 102  CO2 14* 16*  GLUCOSE  104* 95  BUN 9 16  CREATININE 0.95 1.17  CALCIUM 9.3 9.3  PROT 7.9  --   ALBUMIN 3.8  --   AST 28  --   ALT 18  --   ALKPHOS 101  --   BILITOT 0.6  --   GFRNONAA >60 >60  ANIONGAP 11 14     Hematology Recent Labs  Lab 12/05/21 2250 12/07/21 0048  WBC 9.5 13.6*  RBC 4.50 4.22  HGB 11.7* 10.9*  HCT 38.5* 35.1*  MCV 85.6 83.2  MCH 26.0 25.8*  MCHC 30.4 31.1  RDW 22.9* 22.5*  PLT 475* 516*    Cardiac EnzymesNo results for input(s): "TROPONINI" in the last 168 hours. No results for input(s): "TROPIPOC" in the last 168 hours.   BNPNo results for input(s): "BNP", "PROBNP" in the last 168 hours.   DDimer  Recent Labs  Lab 12/06/21 [phone]  DDIMER 0.54*     Radiology    CT Angio Chest PE W and/or Wo Contrast  Result Date: 12/06/2021 CLINICAL DATA:  Concern for pulmonary embolus EXAM: CT ANGIOGRAPHY CHEST WITH CONTRAST TECHNIQUE: Multidetector CT imaging of the chest was performed using the standard protocol  during bolus administration of intravenous contrast. Multiplanar CT image reconstructions and MIPs were obtained to evaluate the vascular anatomy. RADIATION DOSE REDUCTION: This exam was performed according to the departmental dose-optimization program which includes automated exposure control, adjustment of the mA and/or kV according to patient size and/or use of iterative reconstruction technique. CONTRAST:  13m OMNIPAQUE IOHEXOL 350 MG/ML SOLN COMPARISON:  Abdomen and pelvis CT dated November 02, 2021 FINDINGS: Cardiovascular: Normal heart size. No pericardial effusion. Normal caliber thoracic aorta with mild noncalcified plaque. No evidence of pulmonary embolus. Mediastinum/Nodes: Esophagus and thyroid are unremarkable. No pathologically enlarged lymph nodes seen in the chest. Lungs/Pleura: Central airways are patent. Moderate centrilobular emphysema. No consolidation, pleural effusion or pneumothorax. Small solid pulmonary nodule of the left upper lobe measuring 3 mm on series  8, image 60. Upper Abdomen: Cystic lesions of the pancreatic body and tail, unchanged when compared with recent abdomen and pelvis CT and likely pseudocysts. No acute abnormalities. Musculoskeletal: No chest wall abnormality. No acute or significant osseous findings. Review of the MIP images confirms the above findings. IMPRESSION: 1. No evidence of pulmonary embolus or acute airspace opacity. 2. Small solid pulmonary nodule of the left upper lobe measuring 3 mm. Recommend follow-up chest CT in 1 year if patient is high-risk.This recommendation follows the consensus statement: Guidelines for Management of Incidental Pulmonary Nodules Detected on CT Images: From the Fleischner Society 2017; Radiology 2017; 284:228-243. 3. Aortic Atherosclerosis (ICD10-I70.0) and Emphysema (ICD10-J43.9). Electronically Signed   By: LYetta GlassmanM.D.   On: 12/06/2021 13:21   DG Chest Port 1 View  Result Date: 12/05/2021 CLINICAL DATA:  Portable chest for chest pain. EXAM: PORTABLE CHEST 1 VIEW COMPARISON:  PA Lat 11/29/2021. FINDINGS: The heart size and mediastinal contours are within normal limits. Both lungs are clear. The visualized skeletal structures are unremarkable apart from osteopenia. There is a tangle of overlying monitor wiring. IMPRESSION: No active disease.  Stable chest.  Osteopenia. Electronically Signed   By: KTelford NabM.D.   On: 12/05/2021 23:00    Cardiac Studies   Echo:  Pending.   Patient Profile     51y.o. male with a reported history of WPW ablated.  Presented with abdominal pain.  Noted to have PAF in the ED with rapid ventricular rate.   Assessment & Plan    PAF:   Received one dose of Toprol XL 100 mg.    No indication for DOAC at this point.  Sinus tachy likely related in part to hypovolemia.    Can encourage PO intake.  Will follow as needed in hospital and arrange out patient monitor before discharge.  Please contact uK[place]if discharge is planned.    For questions or updates, please  contact [email or web address] consult [email or web address] for contact info under Cardiology/STEMI.   Signed, JMinus Breeding MD  12/07/2021, 7:54 AM

## 2021-12-08 DIAGNOSIS — K859 Acute pancreatitis without necrosis or infection, unspecified: Secondary | ICD-10-CM | POA: Diagnosis not present

## 2021-12-08 DIAGNOSIS — E8809 Other disorders of plasma-protein metabolism, not elsewhere classified: Secondary | ICD-10-CM

## 2021-12-08 DIAGNOSIS — F1020 Alcohol dependence, uncomplicated: Secondary | ICD-10-CM | POA: Diagnosis not present

## 2021-12-08 DIAGNOSIS — I480 Paroxysmal atrial fibrillation: Secondary | ICD-10-CM | POA: Diagnosis not present

## 2021-12-08 DIAGNOSIS — I456 Pre-excitation syndrome: Secondary | ICD-10-CM | POA: Diagnosis not present

## 2021-12-08 DIAGNOSIS — E872 Acidosis, unspecified: Secondary | ICD-10-CM

## 2021-12-08 LAB — CBC WITH DIFFERENTIAL/PLATELET
Abs Immature Granulocytes: 0.03 10*3/uL (ref 0.00–0.07)
Basophils Absolute: 0 10*3/uL (ref 0.0–0.1)
Basophils Relative: 0 %
Eosinophils Absolute: 0.1 10*3/uL (ref 0.0–0.5)
Eosinophils Relative: 1 %
HCT: 37.2 % — ABNORMAL LOW (ref 39.0–52.0)
Hemoglobin: 11.9 g/dL — ABNORMAL LOW (ref 13.0–17.0)
Immature Granulocytes: 1 %
Lymphocytes Relative: 34 %
Lymphs Abs: 1.9 10*3/uL (ref 0.7–4.0)
MCH: 26.2 pg (ref 26.0–34.0)
MCHC: 32 g/dL (ref 30.0–36.0)
MCV: 81.9 fL (ref 80.0–100.0)
Monocytes Absolute: 0.6 10*3/uL (ref 0.1–1.0)
Monocytes Relative: 11 %
Neutro Abs: 3.1 10*3/uL (ref 1.7–7.7)
Neutrophils Relative %: 53 %
Platelets: 172 10*3/uL (ref 150–400)
RBC: 4.54 MIL/uL (ref 4.22–5.81)
RDW: 21.7 % — ABNORMAL HIGH (ref 11.5–15.5)
WBC: 5.7 10*3/uL (ref 4.0–10.5)
nRBC: 0 % (ref 0.0–0.2)

## 2021-12-08 LAB — COMPREHENSIVE METABOLIC PANEL
ALT: 12 U/L (ref 0–44)
AST: 20 U/L (ref 15–41)
Albumin: 2.7 g/dL — ABNORMAL LOW (ref 3.5–5.0)
Alkaline Phosphatase: 58 U/L (ref 38–126)
Anion gap: 5 (ref 5–15)
BUN: 13 mg/dL (ref 6–20)
CO2: 17 mmol/L — ABNORMAL LOW (ref 22–32)
Calcium: 8.2 mg/dL — ABNORMAL LOW (ref 8.9–10.3)
Chloride: 112 mmol/L — ABNORMAL HIGH (ref 98–111)
Creatinine, Ser: 0.7 mg/dL (ref 0.61–1.24)
GFR, Estimated: >60 mL/min (ref >60)
Glucose, Bld: 82 mg/dL (ref 70–99)
Potassium: 3.5 mmol/L (ref 3.5–5.1)
Sodium: 134 mmol/L — ABNORMAL LOW (ref 135–145)
Total Bilirubin: 0.7 mg/dL (ref 0.3–1.2)
Total Protein: 5.3 g/dL — ABNORMAL LOW (ref 6.5–8.1)

## 2021-12-08 MED ORDER — CHLORHEXIDINE GLUCONATE CLOTH 2 % EX PADS
6.0000 | MEDICATED_PAD | Freq: Every day | CUTANEOUS | Status: DC
  Administered 2021-12-08 – 2021-12-15 (×8): 6 via TOPICAL

## 2021-12-08 MED ORDER — POTASSIUM CHLORIDE 2 MEQ/ML IV SOLN
INTRAVENOUS | Status: DC
  Filled 2021-12-08 (×15): qty 1000

## 2021-12-08 MED ORDER — SODIUM CHLORIDE 0.9% FLUSH
10.0000 mL | Freq: Two times a day (BID) | INTRAVENOUS | Status: DC
  Administered 2021-12-08 – 2021-12-12 (×9): 10 mL
  Administered 2021-12-13: 30 mL
  Administered 2021-12-13 – 2021-12-17 (×5): 10 mL

## 2021-12-08 MED ORDER — SODIUM CHLORIDE 0.9% FLUSH: 10.0000 mL | INTRAVENOUS | Status: DC | PRN

## 2021-12-08 NOTE — TOC Progression Note (Signed)
Transition of Care [facility]) - Progression Note    Patient Details  Name: [name] MRN: [id number] Date of Birth: 07-20-1970  Transition of Care [facility]) CM/SW [place], RN Phone Number:[phone]  12/08/2021, 4:12 PM  Clinical Narrative:     Transition of Care [facility]) Screening Note   Patient Details  Name: [name] Date of Birth: [birth year]   Transition of Care [facility] CM/SW Contact:    Angelita Ingles, RN Phone Number: 12/08/2021, 4:12 PM    Transition of Care Department [facility]) has reviewed patient and no TOC needs have been identified at this time. We will continue to monitor patient advancement through interdisciplinary progression rounds. If new patient transition needs arise, please place a TOC consult.          Expected Discharge Plan and Services                                                 Social Determinants of Health (SDOH) Interventions    Readmission Risk Interventions    08/18/2021   10:38 AM 06/08/2021   11:22 AM 08/29/2019    3:45 PM  Readmission Risk Prevention Plan  Transportation Screening Complete Complete Complete  Medication Review (RN Care Manager) Complete Referral to Pharmacy Complete  PCP or Specialist appointment within 3-5 days of discharge Complete Not Complete Complete  PCP/Specialist Appt Not Complete comments  patient not ready to d/c   [place] or [place] Complete Complete Complete  SW Recovery Care/Counseling Consult Complete Complete Complete  Palliative Care Screening Not Applicable Not Applicable Not [place] Not Applicable Not Applicable Not Applicable

## 2021-12-08 NOTE — Progress Notes (Signed)
Progress Note  Patient: [name] SJG:[id number] DOB: 1970/09/06  DOA: 12/05/2021  DOS: 12/08/2021    Brief hospital course: [name] is a 51 y.o. male with a history of WPW s/p ablation, substance use, alcoholic pancreatitis, HTN, and seizure disorder who presented to the ED with abdominal pain, nausea and vomiting as well as more acute chest heaviness and dyspnea. He has continued drinking alcohol. He was afebrile in the ED with newly diagnosed atrial fibrillation with RVR with some aberrancy. CTA chest was negative for PE, showed stable fluid collections, suspected pseudocysts near pancreas. He was admitted for recurrent acute on chronic pancreatitis with suspected hypovloemia-induced SVT, and brief AFib that has converted.   Assessment and Plan: Paroxysmal atrial fibrillation with RVR: Has returned to NSR durably with frequent aberrantly conducted PACs.  - Brief, low cardioembolic risk and no ongoing anticoagulation recommended per cardiology.  - Given history of WPW, said to be s/p ablation, cardiology was consulted, recommends continuing beta blocker and arranging cardiac monitor at discharge.  - Continue metoprolol succinate '100mg'$  - Will add K to IVF to maintain goal level 4. Continue magnesium daily supplementation for hypomagnesemia.   Acute on chronic alcoholic pancreatitis:  - Continue NPO with ongoing pain requiring high dose IV dilaudid.  - Continue IVF. Leukocytosis and reactive thrombocytosis have resolved.  - Has required hospitalization in the past for this, last was April 2023, usually requires several days, up to 10 days.  - Continue IV dilaudid and IV antiemetics for now.  - Continue creon. Continue imodium w/negative infectious work up. - Continue PPI and carafate given GERD and risk of alcoholic gastritis as well. Has GI follow up in ~1 month.  Pulmonary nodule: 3 mm in LUL.  - Recommend 1 year follow-up CT  Alcohol use disorder, severe,  dependence: Normally 6-12 beers daily. Last drink ~2 days PTA. - Continue CIWA protocol, can DC if no emerging withdrawal in next 24 hours - Requests CSW consult to provide resources, has had good experience with [facility] in the past.   Essential hypertension - Continue metoprolol.  Tobacco use:  - Cessation counseling, nicotine patch ordered.  Emphysema: Noted radiologically. No wheezing, etc.  - prn albuterol - Recommend formal PFTs after discharge.   AKI: SCr up to 1.17, improved to 0.70 with IV fluids.   NAGMA: Suspected to be due to GI losses, improving.  - Continue monitoring.   Hypoalbuminemia: BMI 20.  - Dietitian consulted  Normocytic anemia: No reported bleeding.  - Monitor intermittently.  Subjective: Abdominal pain is severe, in upper abdomen and improved significantly with dilaudid, relief lasting longer. Nauseated and not hungry. No chest pain or dyspnea. HR up to 130's overnight, he noted, while to the bathroom.  Objective: BP 110/75 (BP Location: Left Arm)   Pulse 73   Temp (!) 97.3 F (36.3 C) (Oral)   Resp 17   Ht '5\' 8"'$  (1.727 m)   Wt 62.3 kg   SpO2 100%   BMI 20.88 kg/m   Gen: No distress, thin Pulm: Nonlabored breathing room air. Clear. CV: Regular with frequent premature beats, clear p waves and IVCD with those beats. No murmur, rub, or gallop. No JVD, no dependent edema. GI: Abdomen soft, tender in epigastrium, and LUQ without rebound or guarding, with normoactive bowel sounds.    Data Personally reviewed:   GI pathogen panel negative, CDiff negative  ECHO 12/07/2021 LVEF 65-70%, no RWMA, G1DD, RV normal size and function, normal PASP.    CT  Angio Chest 12/06/2021 - No evidence of pulmonary embolus or acute airspace opacity.  - SPN LUL 3 mm.  - Aortic Atherosclerosis - Emphysema   Disposition: Status is: Inpatient because: Requires IV fluids and medications for acute pancreatitis Planned Discharge Destination: Home  [name],  MD 12/08/2021 11:45 AM Page by [email or web address]

## 2021-12-08 NOTE — Progress Notes (Signed)

## 2021-12-09 DIAGNOSIS — I480 Paroxysmal atrial fibrillation: Secondary | ICD-10-CM | POA: Diagnosis not present

## 2021-12-09 DIAGNOSIS — I456 Pre-excitation syndrome: Secondary | ICD-10-CM | POA: Diagnosis not present

## 2021-12-09 DIAGNOSIS — K859 Acute pancreatitis without necrosis or infection, unspecified: Secondary | ICD-10-CM | POA: Diagnosis not present

## 2021-12-09 DIAGNOSIS — F1020 Alcohol dependence, uncomplicated: Secondary | ICD-10-CM | POA: Diagnosis not present

## 2021-12-09 LAB — BASIC METABOLIC PANEL
Anion gap: 7 (ref 5–15)
BUN: 7 mg/dL (ref 6–20)
CO2: 19 mmol/L — ABNORMAL LOW (ref 22–32)
Calcium: 8.5 mg/dL — ABNORMAL LOW (ref 8.9–10.3)
Chloride: 111 mmol/L (ref 98–111)
Creatinine, Ser: 0.75 mg/dL (ref 0.61–1.24)
GFR, Estimated: >60 mL/min (ref >60)
Glucose, Bld: 80 mg/dL (ref 70–99)
Potassium: 3.6 mmol/L (ref 3.5–5.1)
Sodium: 137 mmol/L (ref 135–145)

## 2021-12-09 MED ORDER — LORAZEPAM 1 MG PO TABS
1.0000 mg | ORAL_TABLET | Freq: Four times a day (QID) | ORAL | Status: DC | PRN
Start: 2021-12-09 — End: 2021-12-09

## 2021-12-09 MED ORDER — LORAZEPAM 2 MG/ML IJ SOLN
1.0000 mg | Freq: Four times a day (QID) | INTRAMUSCULAR | Status: DC | PRN
Start: 2021-12-09 — End: 2021-12-10
  Administered 2021-12-09 – 2021-12-10 (×2): 1 mg via INTRAVENOUS
  Filled 2021-12-09 (×2): qty 1

## 2021-12-09 NOTE — Plan of Care (Signed)

## 2021-12-09 NOTE — Progress Notes (Signed)
Progress Note  Patient Name: [name] Date of Encounter: 12/09/2021  Primary Cardiologist:   None   Subjective   Abdominal pain is improved.  No acute SOB.  No palpitations  Inpatient Medications    Scheduled Meds:  Chlorhexidine Gluconate Cloth  6 each Topical Daily   enoxaparin (LOVENOX) injection  40 mg Subcutaneous Q73A   folic acid  1 mg Oral Daily   lipase/protease/amylase  72,000 Units Oral TID WC   magnesium oxide  400 mg Oral Daily   metoprolol succinate  100 mg Oral Daily   multivitamin with minerals  1 tablet Oral Daily   nicotine  21 mg Transdermal Daily   pantoprazole  40 mg Oral Daily   sodium chloride flush  10-40 mL Intracatheter Q12H   sucralfate  1 g Oral TID WC & HS   thiamine  100 mg Oral Daily   Continuous Infusions:  lactated ringers 1,000 mL with potassium chloride 20 mEq infusion 100 mL/hr at 12/09/21 0109   PRN Meds: acetaminophen, HYDROmorphone (DILAUDID) injection, loperamide, LORazepam **OR** LORazepam, ondansetron (ZOFRAN) IV, sodium chloride flush   Vital Signs    Vitals:   12/09/21 0500 12/09/21 0530 12/09/21 0600 12/09/21 0630  BP:      Pulse:      Resp: '14 13 19 11  '$ Temp:      TempSrc:      SpO2:      Weight:      Height:        Intake/Output Summary (Last 24 hours) at 12/09/2021 0824 Last data filed at 12/08/2021 2158 Gross per 24 hour  Intake 538.41 ml  Output --  Net 538.41 ml   Filed Weights   12/05/21 2214 12/06/21 2100  Weight: 61 kg 62.3 kg    Telemetry    NSR, PVCs, PACs, couplets.  No sustained arrhythmias - Personally Reviewed  ECG    NA - Personally Reviewed  Physical Exam   GEN: No acute distress.   Neck: No  JVD Cardiac:   RRR, no murmurs, rubs, or gallops.  Respiratory: Clear  to auscultation bilaterally. GI: Soft, nontender, non-distended  MS: No  edema; No deformity. Neuro:  Nonfocal  Psych: Normal affect   Labs    Chemistry Recent Labs  Lab 12/06/21 0013 12/07/21 0048  12/08/21 0102 12/09/21 0114  NA 132* 132* 134* 137  K 4.2 4.0 3.5 3.6  CL 107 102 112* 111  CO2 14* 16* 17* 19*  GLUCOSE 104* 95 82 80  BUN '9 16 13 7  '$ CREATININE 0.95 1.17 0.70 0.75  CALCIUM 9.3 9.3 8.2* 8.5*  PROT 7.9  --  5.3*  --   ALBUMIN 3.8  --  2.7*  --   AST 28  --  20  --   ALT 18  --  12  --   ALKPHOS 101  --  58  --   BILITOT 0.6  --  0.7  --   GFRNONAA >60 >60 >60 >60  ANIONGAP '11 14 5 7     '$ Hematology Recent Labs  Lab 12/05/21 2250 12/07/21 0048 12/08/21 0102  WBC 9.5 13.6* 5.7  RBC 4.50 4.22 4.54  HGB 11.7* 10.9* 11.9*  HCT 38.5* 35.1* 37.2*  MCV 85.6 83.2 81.9  MCH 26.0 25.8* 26.2  MCHC 30.4 31.1 32.0  RDW 22.9* 22.5* 21.7*  PLT 475* 516* 172    Cardiac EnzymesNo results for input(s): "TROPONINI" in the last 168 hours. No results for input(s): "TROPIPOC"  in the last 168 hours.   BNPNo results for input(s): "BNP", "PROBNP" in the last 168 hours.   DDimer  Recent Labs  Lab 12/06/21 1607  DDIMER 0.54*     Radiology    ECHOCARDIOGRAM COMPLETE  Result Date: 12/07/2021    ECHOCARDIOGRAM REPORT   Patient Name:   [name] Date of Exam: 12/07/2021 Medical Rec #:  [id number]              Height:       68.0 in Accession #:    [id number]             Weight:       137.3 lb Date of Birth:  Feb 13, 1971               BSA:          1.742 m Patient Age:    51 years               BP:           116/77 mmHg Patient Gender: M                      HR:           68 bpm. Exam Location:  Inpatient Procedure: 2D Echo, Cardiac Doppler and Color Doppler Indications:    Atrial fibrillation  History:        Patient has prior history of Echocardiogram examinations, most                 recent 08/26/2019. Risk Factors:Hypertension.  Sonographer:    Jefferey Pica Referring Phys: [place]  1. Left ventricular ejection fraction, by estimation, is 65 to 70%. The left ventricle has normal function. The left ventricle has no regional wall motion  abnormalities. Left ventricular diastolic parameters are consistent with Grade I diastolic dysfunction (impaired relaxation).  2. Right ventricular systolic function is normal. The right ventricular size is normal. There is normal pulmonary artery systolic pressure.  3. The mitral valve is normal in structure. No evidence of mitral valve regurgitation. No evidence of mitral stenosis.  4. The aortic valve is tricuspid. Aortic valve regurgitation is not visualized. No aortic stenosis is present.  5. The inferior vena cava is normal in size with greater than 50% respiratory variability, suggesting right atrial pressure of 3 mmHg. Comparison(s): No significant change from prior study. FINDINGS  Left Ventricle: Left ventricular ejection fraction, by estimation, is 65 to 70%. The left ventricle has normal function. The left ventricle has no regional wall motion abnormalities. The left ventricular internal cavity size was normal in size. There is  no left ventricular hypertrophy. Left ventricular diastolic parameters are consistent with Grade I diastolic dysfunction (impaired relaxation). Right Ventricle: The right ventricular size is normal. No increase in right ventricular wall thickness. Right ventricular systolic function is normal. There is normal pulmonary artery systolic pressure. The tricuspid regurgitant velocity is 2.50 m/s, and  with an assumed right atrial pressure of 3 mmHg, the estimated right ventricular systolic pressure is 09.3 mmHg. Left Atrium: Left atrial size was normal in size. Right Atrium: Right atrial size was normal in size. Pericardium: Trivial pericardial effusion is present. Mitral Valve: The mitral valve is normal in structure. No evidence of mitral valve regurgitation. No evidence of mitral valve stenosis. Tricuspid Valve: The tricuspid valve is normal in structure. Tricuspid valve regurgitation is trivial. No evidence of tricuspid stenosis. Aortic Valve: The aortic  valve is tricuspid. Aortic  valve regurgitation is not visualized. No aortic stenosis is present. Aortic valve peak gradient measures 6.9 mmHg. Pulmonic Valve: The pulmonic valve was normal in structure. Pulmonic valve regurgitation is not visualized. No evidence of pulmonic stenosis. Aorta: The aortic root, ascending aorta and aortic arch are all structurally normal, with no evidence of dilitation or obstruction. Venous: The inferior vena cava is normal in size with greater than 50% respiratory variability, suggesting right atrial pressure of 3 mmHg. IAS/Shunts: No atrial level shunt detected by color flow Doppler.  LEFT VENTRICLE PLAX 2D LVIDd:         4.40 cm   Diastology LVIDs:         2.20 cm   LV e' lateral:   11.20 cm/s LV PW:         1.10 cm   LV E/e' lateral: 7.4 LV IVS:        0.80 cm LVOT diam:     1.90 cm LV SV:         55 LV SV Index:   31 LVOT Area:     2.84 cm  IVC IVC diam: 1.70 cm LEFT ATRIUM             Index        RIGHT ATRIUM          Index LA diam:        3.30 cm 1.89 cm/m   RA Area:     9.17 cm LA Vol (A2C):   28.0 ml 16.07 ml/m  RA Volume:   17.70 ml 10.16 ml/m LA Vol (A4C):   37.3 ml 21.41 ml/m LA Biplane Vol: 32.4 ml 18.60 ml/m  AORTIC VALVE                 PULMONIC VALVE AV Area (Vmax): 2.45 cm     PV Vmax:       0.91 m/s AV Vmax:        131.75 cm/s  PV Peak grad:  3.3 mmHg AV Peak Grad:   6.9 mmHg LVOT Vmax:      113.67 cm/s LVOT Vmean:     65.533 cm/s LVOT VTI:       0.193 m  AORTA Ao Root diam: 2.50 cm Ao Asc diam:  2.50 cm MITRAL VALVE               TRICUSPID VALVE MV Area (PHT): 4.15 cm    TR Peak grad:   25.0 mmHg MV Decel Time: 183 msec    TR Vmax:        250.00 cm/s MV E velocity: 83.20 cm/s MV A velocity: 85.90 cm/s  SHUNTS MV E/A ratio:  0.97        Systemic VTI:  0.19 m                            Systemic Diam: 1.90 cm Rudean Haskell MD Electronically signed by Rudean Haskell MD Signature Date/Time: 12/07/2021/10:49:23 AM    Final     Cardiac Studies   Echo:  See above. .   Patient  Profile     51 y.o. male with a reported history of WPW ablated.  Presented with abdominal pain.  Noted to have PAF in the ED with rapid ventricular rate.   Assessment & Plan    PAF:    Echo with well preserved EF.  No significant valvular abnormalities.  He is  tolerating the low dose of beta blocker.  No indication for DOAC with his short paroxysms and low Mr. [name] has a CHA2DS2 - VASc score.  I will arrange follow up for him and can place an event recorder at that time to follow out patient burden of arrhythmia.      For questions or updates, please contact [place] Please consult [email or web address] for contact info under Cardiology/STEMI.   Signed, Minus Breeding, MD  12/09/2021, 8:24 AM

## 2021-12-09 NOTE — Plan of Care (Signed)
  Problem: Health Behavior/Discharge Planning: Goal: Ability to manage health-related needs will improve Outcome: Not Progressing   Problem: Clinical Measurements: Goal: Ability to maintain clinical measurements within normal limits will improve Outcome: Not Progressing   Problem: Nutrition: Goal: Adequate nutrition will be maintained Outcome: Not Progressing   Problem: Coping: Goal: Level of anxiety will decrease Outcome: Not Progressing   Problem: Education: Goal: Knowledge of General Education information will improve Description: Including pain rating scale, medication(s)/side effects and non-pharmacologic comfort measures Outcome: Progressing   Problem: Clinical Measurements: Goal: Will remain free from infection Outcome: Progressing Goal: Diagnostic test results will improve Outcome: Progressing Goal: Respiratory complications will improve Outcome: Progressing Goal: Cardiovascular complication will be avoided Outcome: Progressing   Problem: Activity: Goal: Risk for activity intolerance will decrease Outcome: Progressing   Problem: Elimination: Goal: Will not experience complications related to bowel motility Outcome: Progressing Goal: Will not experience complications related to urinary retention Outcome: Progressing   Problem: Pain Managment: Goal: General experience of comfort will improve Outcome: Progressing   Problem: Safety: Goal: Ability to remain free from injury will improve Outcome: Progressing   Problem: Skin Integrity: Goal: Risk for impaired skin integrity will decrease Outcome: Progressing

## 2021-12-09 NOTE — Progress Notes (Signed)
Progress Note  Patient: [name] KXF:[id number] DOB: Nov 12, 1970  DOA: 12/05/2021  DOS: 12/09/2021    Brief hospital course: [name] is a 51 y.o. male with a history of WPW s/p ablation, substance use, alcoholic pancreatitis, HTN, and seizure disorder who presented to the ED with abdominal pain, nausea and vomiting as well as more acute chest heaviness and dyspnea. He has continued drinking alcohol. He was afebrile in the ED with newly diagnosed atrial fibrillation with RVR with some aberrancy. CTA chest was negative for PE, showed stable fluid collections, suspected pseudocysts near pancreas. He was admitted for recurrent acute on chronic pancreatitis with suspected hypovloemia-induced SVT, and brief AFib that has converted.   Assessment and Plan: Paroxysmal atrial fibrillation with RVR: Has returned to NSR durably with frequent aberrantly conducted PACs.  - Brief, low cardioembolic risk and no ongoing anticoagulation recommended per cardiology.  - Given history of WPW, said to be s/p ablation, cardiology was consulted, recommends continuing beta blocker and arranging cardiac monitor at discharge.  - Continue metoprolol succinate '100mg'$  - Continue balanced IVF w/K (level 3.6) and magnesium daily supplementation for hypomagnesemia.   Acute on chronic alcoholic pancreatitis:  - Will continue NPO status with ongoing pain requiring high dose IV dilaudid, though exam is slightly improved. We will likely transition to clear liquids in next 24 hours at which time will start process of transitioning from IV dilaudid to oral oxycodone.  - Continue IVF. Leukocytosis and reactive thrombocytosis have resolved.  - Has required hospitalization in the past for this, last was April 2023, usually requires several days, up to 10 days.  - Continue IV dilaudid and IV antiemetics for now.  - Continue creon. Continue imodium w/negative infectious work up. - Continue PPI and carafate given GERD  and risk of alcoholic gastritis as well. Has GI follow up in ~1 month.  Pulmonary nodule: 3 mm in LUL.  - Recommend 1 year follow-up CT  Alcohol use disorder, severe, dependence: Normally 6-12 beers daily. Last drink ~2 days PTA. - DC CIWA.  - Consulted CSW to provide resources, has had good experience with [facility] in the past.   Essential hypertension - Continue metoprolol.  Tobacco use:  - Cessation counseling, nicotine patch ordered.  Emphysema: Noted radiologically. No wheezing, etc.  - prn albuterol - Recommend formal PFTs after discharge.   AKI: SCr up to 1.17, improved to 0.70 with IV fluids.   NAGMA: Suspected to be due to GI losses, improving.  - Continue monitoring.   Hypoalbuminemia: BMI 20.  - Dietitian consulted  Normocytic anemia: No reported bleeding.  - Monitor intermittently.  Subjective: Continues to require IV dilaudid despite only taking ice chips, though feels pain in general is better today than yesterday. He's hungry but reluctant to start diet.   Objective: BP 112/67 (BP Location: Right Arm)   Pulse 70   Temp 97.8 F (36.6 C) (Oral)   Resp 18   Ht '5\' 8"'$  (1.727 m)   Wt 62.3 kg   SpO2 100%   BMI 20.88 kg/m   Gen: 51 y.o. male in no distress Pulm: Nonlabored breathing room air. Clear. CV: Regular with frequent premature beats, no murmur, rub, or gallop. No JVD, no dependent edema. GI: Abdomen soft, improved tenderness in epigastrium without rebound or guarding, +BS.   Ext: Warm, no deformities Skin: No rashes, lesions or ulcers on visualized skin. Neuro: Alert and oriented. No focal neurological deficits. Psych: Judgement and insight appear fair. Mood euthymic & affect  congruent. Behavior is appropriate.    Data Personally reviewed:   GI pathogen panel negative, CDiff negative  ECHO 12/07/2021 LVEF 65-70%, no RWMA, G1DD, RV normal size and function, normal PASP.    CT Angio Chest 12/06/2021 - No evidence of pulmonary embolus or acute  airspace opacity.  - SPN LUL 3 mm.  - Aortic Atherosclerosis - Emphysema   Disposition: Status is: Inpatient because: Requires IV fluids and medications for acute pancreatitis Planned Discharge Destination: Home  [name], MD 12/09/2021 1:35 PM Page by [email or web address]

## 2021-12-10 DIAGNOSIS — I456 Pre-excitation syndrome: Secondary | ICD-10-CM | POA: Diagnosis not present

## 2021-12-10 DIAGNOSIS — F1020 Alcohol dependence, uncomplicated: Secondary | ICD-10-CM | POA: Diagnosis not present

## 2021-12-10 DIAGNOSIS — K859 Acute pancreatitis without necrosis or infection, unspecified: Secondary | ICD-10-CM | POA: Diagnosis not present

## 2021-12-10 DIAGNOSIS — I480 Paroxysmal atrial fibrillation: Secondary | ICD-10-CM | POA: Diagnosis not present

## 2021-12-10 LAB — BASIC METABOLIC PANEL
Anion gap: 9 (ref 5–15)
BUN: <5 mg/dL — ABNORMAL LOW (ref 6–20)
CO2: 18 mmol/L — ABNORMAL LOW (ref 22–32)
Calcium: 8.3 mg/dL — ABNORMAL LOW (ref 8.9–10.3)
Chloride: 110 mmol/L (ref 98–111)
Creatinine, Ser: 0.69 mg/dL (ref 0.61–1.24)
GFR, Estimated: >60 mL/min (ref >60)
Glucose, Bld: 105 mg/dL — ABNORMAL HIGH (ref 70–99)
Potassium: 3.8 mmol/L (ref 3.5–5.1)
Sodium: 137 mmol/L (ref 135–145)

## 2021-12-10 MED ORDER — LORAZEPAM 1 MG PO TABS
1.0000 mg | ORAL_TABLET | Freq: Four times a day (QID) | ORAL | Status: DC | PRN
  Administered 2021-12-10 – 2021-12-22 (×34): 1 mg via ORAL
  Filled 2021-12-10 (×34): qty 1

## 2021-12-10 MED ORDER — BOOST / RESOURCE BREEZE PO LIQD CUSTOM
1.0000 | Freq: Three times a day (TID) | ORAL | Status: DC
  Administered 2021-12-19 – 2021-12-24 (×5): 1 via ORAL

## 2021-12-10 NOTE — Progress Notes (Signed)
    [facility] will sign off.   Medication Recommendations:  As on [facility] Other recommendations (labs, testing, etc):  None Follow up as an outpatient:  Has follow up scheduled with me on 8/28

## 2021-12-10 NOTE — Progress Notes (Signed)
Initial Nutrition Assessment  DOCUMENTATION CODES:   Not applicable  INTERVENTION:   Diet advancement as tolerated  While on CL diet, Boost Breeze po TID, each supplement provides 250 kcal and 9 grams of protein  Once diet advanced, change Boost Breeze to Ensure Enlive po BID, each supplement provides 350 kcal and 20 grams of protein.  Continue Creon with meals/snacks/supplements  Provided patient with "Pancreatitis Nutrition Therapy" handout from the Academy of [employer].   Continue MVI, Thiamine, folic acid currently  Continue MVI and Vit D supplementation post discharge   NUTRITION DIAGNOSIS:   Increased nutrient needs related to chronic illness as evidenced by estimated needs.  GOAL:   Patient will meet greater than or equal to 90% of their needs  MONITOR:   PO intake, Supplement acceptance, Diet advancement, Labs, Weight trends, I & O's  REASON FOR ASSESSMENT:   Consult Assessment of nutrition requirement/status  ASSESSMENT:   51 yo male admitted with afib with RVR, acute on chronic alcoholic pancreatitis. PMH chronic pancreatitis on PERT, [name] s/p ablation, mood disorder-major depressive disorder, PTSD, bipolar, HTN, asthma, EtOH dependence, cocaine use, tobacco use  CL diet ordered, tolerating small amounts. Pt isn't sure he is ready for solid food yet.   Pt with continued EtOH use, normally 6-12 beers daily, last drink 2 days PTA. Encourage abstinence of EtOH  Pt reports abdominal pain at least 5/7 days a week; pt is is usually well managed by prescribed pain medicine.   Pt reports he lives at home with his Mother. His mother does the cooking and they both do the grocery shopping. Pt reports he usually eats 2 good meals per day, drinks 1 Ensure daily. Pt reports he takes his creon anything he eats something. Pt reports he also takes a MVI and Vit D supplement at home  Pt reports he believes his Creon is at an appropriate dose  as diarrhea is manageable and does not appear like steatorrhea.   Pt reports UBW around 135 pounds; current wt 137.4 pounds, admit wt 134.5 pounds.   Pt requested information regarding diet with regards to his pancreas. RD provided this to pt  Labs: reviewed Meds: creon with meals, LR with KCl at 277 ml/hr, folic acid, MVI, mag ox, thiamine, carafate   Diet Order:   Diet Order             Diet clear liquid Room service appropriate? Yes; Fluid consistency: Thin  Diet effective now                   EDUCATION NEEDS:   Education needs have been addressed  Skin:  Skin Assessment: Reviewed RN Assessment  Last BM:  8/2  Height:   Ht Readings from Last 1 Encounters:  12/06/21 '5\' 8"'$  (1.727 m)    Weight:   Wt Readings from Last 1 Encounters:  12/06/21 62.3 kg     BMI:  Body mass index is 20.88 kg/m.  Estimated Nutritional Needs:   Kcal:  2300-2500 kcals  Protein:  115-130 g  Fluid:  >/= 2 L   Jason Passey MS, RDN, LDN, CNSC Registered Dietitian 3 Clinical Nutrition RD Pager and On-Call Pager Number Located in [place]

## 2021-12-10 NOTE — Plan of Care (Signed)
  Problem: Education: Goal: Knowledge of General Education information will improve Description: Including pain rating scale, medication(s)/side effects and non-pharmacologic comfort measures Outcome: Progressing   Problem: Health Behavior/Discharge Planning: Goal: Ability to manage health-related needs will improve Outcome: Progressing   Problem: Clinical Measurements: Goal: Ability to maintain clinical measurements within normal limits will improve Outcome: Progressing Goal: Will remain free from infection Outcome: Progressing Goal: Diagnostic test results will improve Outcome: Progressing Goal: Respiratory complications will improve Outcome: Progressing Goal: Cardiovascular complication will be avoided Outcome: Progressing   Problem: Activity: Goal: Risk for activity intolerance will decrease Outcome: Progressing   Problem: Nutrition: Goal: Adequate nutrition will be maintained Outcome: Progressing   Problem: Coping: Goal: Level of anxiety will decrease Outcome: Progressing   Problem: Elimination: Goal: Will not experience complications related to bowel motility Outcome: Progressing Goal: Will not experience complications related to urinary retention Outcome: Progressing   Problem: Safety: Goal: Ability to remain free from injury will improve Outcome: Progressing   

## 2021-12-10 NOTE — Progress Notes (Signed)
Progress Note  Patient: [name] [id number] DOB: 02/27/1971  DOA: 12/05/2021  DOS: 12/10/2021    Brief hospital course: [name] is a 51 y.o. male with a history of WPW s/p ablation, substance use, alcoholic pancreatitis, HTN, and seizure disorder who presented to the ED with abdominal pain, nausea and vomiting as well as more acute chest heaviness and dyspnea. He has continued drinking alcohol. He was afebrile in the ED with newly diagnosed atrial fibrillation with RVR with some aberrancy. CTA chest was negative for PE, showed stable fluid collections, suspected pseudocysts near pancreas. He was admitted for recurrent acute on chronic pancreatitis with suspected hypovloemia-induced SVT, and brief AFib that has converted.   Assessment and Plan: Paroxysmal atrial fibrillation with RVR: Has returned to NSR durably with frequent aberrantly conducted PACs.  - Brief, low cardioembolic risk and no ongoing anticoagulation recommended per cardiology.  - Given history of WPW, said to be s/p ablation, cardiology was consulted, recommends continuing beta blocker and arranging cardiac monitor at discharge.  - Continue metoprolol succinate '100mg'$  - Continue balanced IVF w/K (level 3.6) and magnesium daily supplementation for hypomagnesemia.   Acute on chronic alcoholic pancreatitis:  - Start clear liquid diet and monitor. If unable to advance, would repeat CT abd - Continue IVF. Leukocytosis and reactive thrombocytosis have resolved.  - Has required hospitalization in the past for this, last was April 2023, usually requires several days, up to 10 days.  - Continue IV dilaudid and IV antiemetics for now.  - Continue creon. Continue imodium w/negative infectious work up. - Continue PPI and carafate given GERD and risk of alcoholic gastritis as well. Has GI follow up in ~1 month.  Pulmonary nodule: 3 mm in LUL.  - Recommend 1 year follow-up CT  Alcohol use disorder, severe,  dependence: Normally 6-12 beers daily. Last drink ~2 days PTA. - DC CIWA.  - Consulted CSW to provide resources, has had good experience with [facility] in the past.   Essential hypertension - Continue metoprolol.  Tobacco use:  - Cessation counseling, nicotine patch ordered.  Emphysema: Noted radiologically. No wheezing, etc.  - prn albuterol - Recommend formal PFTs after discharge.   AKI: SCr up to 1.17, improved to 0.70 with IV fluids.   NAGMA: Suspected to be due to GI losses, improving.  - Continue monitoring.   Hypoalbuminemia: BMI 20.  - Dietitian consulted  Normocytic anemia: No reported bleeding.  - Monitor intermittently.  Subjective: Abdominal pain overall better, wants to trial po intake.   Objective: BP 130/83   Pulse 76   Temp 97.7 F (36.5 C) (Oral)   Resp 15   Ht '5\' 8"'$  (1.727 m)   Wt 62.3 kg   SpO2 97%   BMI 20.88 kg/m   Gen: 51 y.o. male in no distress Pulm: Nonlabored breathing room air. Clear. CV: Regular rate and rhythm. No murmur, rub, or gallop. No JVD, no dependent edema. GI: Abdomen soft, tender only to deep palpation in epigastrium (improved), non-distended, with normoactive bowel sounds.  Ext: Warm, no deformities Skin: No rashes, lesions or ulcers on visualized skin. Neuro: Alert and oriented. No focal neurological deficits. Psych: Judgement and insight appear fair. Mood euthymic & affect congruent. Behavior is appropriate.    Data Personally reviewed:   GI pathogen panel negative, CDiff negative  ECHO 12/07/2021 LVEF 65-70%, no RWMA, G1DD, RV normal size and function, normal PASP.    CT Angio Chest 12/06/2021 - No evidence of pulmonary embolus or acute airspace  opacity.  - SPN LUL 3 mm.  - Aortic Atherosclerosis - Emphysema   Disposition: Status is: Inpatient because: Requires IV fluids and medications for acute pancreatitis Planned Discharge Destination: Home  [name], MD 12/10/2021 12:23 PM Page by [email or web address]

## 2021-12-11 ENCOUNTER — Inpatient Hospital Stay (HOSPITAL_COMMUNITY): Payer: Medicaid Other

## 2021-12-11 DIAGNOSIS — I456 Pre-excitation syndrome: Secondary | ICD-10-CM | POA: Diagnosis not present

## 2021-12-11 DIAGNOSIS — N179 Acute kidney failure, unspecified: Secondary | ICD-10-CM

## 2021-12-11 DIAGNOSIS — I480 Paroxysmal atrial fibrillation: Secondary | ICD-10-CM | POA: Diagnosis not present

## 2021-12-11 DIAGNOSIS — E872 Acidosis, unspecified: Secondary | ICD-10-CM

## 2021-12-11 DIAGNOSIS — K862 Cyst of pancreas: Secondary | ICD-10-CM

## 2021-12-11 DIAGNOSIS — K859 Acute pancreatitis without necrosis or infection, unspecified: Secondary | ICD-10-CM | POA: Diagnosis not present

## 2021-12-11 DIAGNOSIS — K863 Pseudocyst of pancreas: Secondary | ICD-10-CM

## 2021-12-11 DIAGNOSIS — K861 Other chronic pancreatitis: Secondary | ICD-10-CM

## 2021-12-11 DIAGNOSIS — R1013 Epigastric pain: Secondary | ICD-10-CM

## 2021-12-11 DIAGNOSIS — K219 Gastro-esophageal reflux disease without esophagitis: Secondary | ICD-10-CM

## 2021-12-11 DIAGNOSIS — D72825 Bandemia: Secondary | ICD-10-CM

## 2021-12-11 DIAGNOSIS — E8809 Other disorders of plasma-protein metabolism, not elsewhere classified: Secondary | ICD-10-CM

## 2021-12-11 DIAGNOSIS — D649 Anemia, unspecified: Secondary | ICD-10-CM

## 2021-12-11 DIAGNOSIS — R197 Diarrhea, unspecified: Secondary | ICD-10-CM

## 2021-12-11 MED ORDER — PANTOPRAZOLE SODIUM 40 MG IV SOLR
40.0000 mg | Freq: Once | INTRAVENOUS | Status: AC
  Administered 2021-12-11: 40 mg via INTRAVENOUS
  Filled 2021-12-11: qty 10

## 2021-12-11 NOTE — Progress Notes (Addendum)
Mobility Specialist Progress Note:   12/11/21 0910  Mobility  Activity Ambulated independently in room  Level of Assistance Independent  Assistive Device None  Distance Ambulated (ft) 50 ft  Activity Response Tolerated well  $Mobility charge 1 Mobility   Pt ambulating in room. No complaints. Left in room with all needs met.   [facility] Bentlie Catanzaro Mobility Specialist

## 2021-12-11 NOTE — Progress Notes (Signed)
Progress Note  Patient: [name] [id number] DOB: June 26, 1970  DOA: 12/05/2021  DOS: 12/11/2021    Brief hospital course: [name] is a 51 y.o. male with a history of WPW s/p ablation, substance use, alcoholic pancreatitis, HTN, and seizure disorder who presented to the ED with abdominal pain, nausea and vomiting as well as more acute chest heaviness and dyspnea. He has continued drinking alcohol. He was afebrile in the ED with newly diagnosed atrial fibrillation with RVR with some aberrancy. CTA chest was negative for PE, showed stable fluid collections, suspected pseudocysts near pancreas. He was admitted for recurrent acute on chronic pancreatitis with suspected hypovloemia-induced SVT, and brief AFib that has converted.   Attempt at initiating diet on 8/4 was unsuccessful. CT abd/pelvis ordered 8/5.   Assessment and Plan: Acute on chronic alcoholic pancreatitis:  - Had recurrence of abdominal pain with clears 8/4. Abd exam without peritonitis though remains quite tender. Will check CT abd/pelvis. Possibility of concomitant gastritis contributing as well. If no worsening active inflammation, would retrial diet.  - Continue PPI, carafate, and avoiding NSAIDs. Consider GI evaluation if failing to progress.  - Continue NPO for now. If unable to advance diet in next 24-48 hours, would need post-pyloric feeding tube placed for nutrition. - Continue IVF. Leukocytosis and reactive thrombocytosis have resolved.  - Has required hospitalization in the past for this, last was April 2023, usually requires several days, up to 10 days.  - Continue IV dilaudid and IV antiemetics for now.  - Continue creon. Continue imodium w/negative infectious work up.   Paroxysmal atrial fibrillation with RVR: Has returned to NSR durably with frequent aberrantly conducted PACs.  - Brief, low cardioembolic risk and no ongoing anticoagulation recommended per cardiology.  - Given history of WPW,  said to be s/p ablation, cardiology was consulted, recommends continuing beta blocker and arranging cardiac monitor at discharge.  - Continue metoprolol succinate '100mg'$  - Continue balanced IVF w/K (level 3.6) and magnesium daily supplementation for hypomagnesemia.  Pulmonary nodule: 3 mm in LUL.  - Recommend 1 year follow-up CT  Alcohol use disorder, severe, dependence: Normally 6-12 beers daily. Last drink ~2 days PTA. - DC CIWA.  - Consulted CSW to provide resources, has had good experience with [facility] in the past.   Essential hypertension - Continue metoprolol.  Tobacco use:  - Cessation counseling, nicotine patch ordered.  Emphysema: Noted radiologically. No wheezing, etc.  - prn albuterol - Recommend formal PFTs after discharge.   AKI: SCr up to 1.17, improved to 0.70 with IV fluids.   NAGMA: Suspected to be due to GI losses, improving.  - Continue monitoring.   Hypoalbuminemia: BMI 20.  - Dietitian consulted  Normocytic anemia: No reported bleeding.  - Monitor intermittently.  Subjective: Had return of severe epigastric abdominal pain with reinitiation of broth diet yesterday afternoon. Still using IV dilaudid very frequently. No vomiting.   Objective: BP (!) 157/90 (BP Location: Right Arm)   Pulse 80   Temp 98 F (36.7 C) (Oral)   Resp 15   Ht '5\' 8"'$  (1.727 m)   Wt 62.3 kg   SpO2 100%   BMI 20.88 kg/m   Gen: 51 y.o. male in no distress Pulm: Nonlabored breathing room air. Clear. CV: Regular rate and rhythm. No murmur, rub, or gallop. No JVD, no dependent edema. GI: Abdomen soft but tender in epigastrium without rebound or guarding, +BS.  Ext: Warm, no deformities Skin: No rashes, lesions or ulcers on visualized skin. Neuro:  Alert and oriented. No focal neurological deficits. Psych: Judgement and insight appear fair. Mood euthymic & affect congruent. Behavior is appropriate.    Data Personally reviewed:   GI pathogen panel negative, CDiff negative  ECHO  12/07/2021 LVEF 65-70%, no RWMA, G1DD, RV normal size and function, normal PASP.    CT Angio Chest 12/06/2021 - No evidence of pulmonary embolus or acute airspace opacity.  - SPN LUL 3 mm.  - Aortic Atherosclerosis - Emphysema   CT Abd/pelvis 8/5: Ordered  Disposition: Status is: Inpatient because: Requires IV fluids and medications for acute pancreatitis Planned Discharge Destination: Home  [name], MD 12/11/2021 10:03 AM Page by [email or web address]

## 2021-12-12 DIAGNOSIS — R1013 Epigastric pain: Secondary | ICD-10-CM | POA: Diagnosis not present

## 2021-12-12 DIAGNOSIS — K859 Acute pancreatitis without necrosis or infection, unspecified: Secondary | ICD-10-CM | POA: Diagnosis not present

## 2021-12-12 DIAGNOSIS — I480 Paroxysmal atrial fibrillation: Secondary | ICD-10-CM | POA: Diagnosis not present

## 2021-12-12 DIAGNOSIS — I456 Pre-excitation syndrome: Secondary | ICD-10-CM | POA: Diagnosis not present

## 2021-12-12 MED ORDER — NICOTINE 21 MG/24HR TD PT24
21.0000 mg | MEDICATED_PATCH | Freq: Every day | TRANSDERMAL | Status: DC
  Administered 2021-12-12 – 2021-12-23 (×12): 21 mg via TRANSDERMAL
  Filled 2021-12-12 (×12): qty 1

## 2021-12-12 MED ORDER — DIPHENHYDRAMINE HCL 25 MG PO CAPS
50.0000 mg | ORAL_CAPSULE | Freq: Four times a day (QID) | ORAL | Status: DC | PRN
Start: 2021-12-12 — End: 2021-12-22
  Administered 2021-12-13 – 2021-12-21 (×10): 50 mg via ORAL
  Filled 2021-12-12 (×10): qty 2

## 2021-12-12 MED ORDER — HYDRALAZINE HCL 20 MG/ML IJ SOLN
5.0000 mg | INTRAMUSCULAR | Status: DC | PRN
  Administered 2021-12-12: 5 mg via INTRAVENOUS
  Filled 2021-12-12: qty 1

## 2021-12-12 MED ORDER — CLOTRIMAZOLE 1 % EX CREA
TOPICAL_CREAM | Freq: Two times a day (BID) | CUTANEOUS | Status: DC
  Administered 2021-12-18 – 2021-12-19 (×2): 1 via TOPICAL
  Filled 2021-12-12 (×3): qty 15

## 2021-12-12 MED ORDER — AMLODIPINE BESYLATE 5 MG PO TABS
5.0000 mg | ORAL_TABLET | Freq: Every day | ORAL | Status: DC
  Administered 2021-12-13: 5 mg via ORAL
  Filled 2021-12-12 (×2): qty 1

## 2021-12-12 NOTE — Progress Notes (Addendum)
Progress Note  Patient: Trea Latner [id number] DOB: 08-24-1970  DOA: 12/05/2021  DOS: 12/12/2021    Brief hospital course: Daylin Darsh Vandevoort is a 51 y.o. male with a history of WPW s/p ablation, substance use, alcoholic pancreatitis, HTN, and seizure disorder who presented to the ED with abdominal pain, nausea and vomiting as well as more acute chest heaviness and dyspnea. He has continued drinking alcohol. He was afebrile in the ED with newly diagnosed atrial fibrillation with RVR with some aberrancy. CTA chest was negative for PE, showed stable fluid collections, suspected pseudocysts near pancreas. He was admitted for recurrent acute on chronic pancreatitis with suspected hypovloemia-induced SVT, and brief AFib that has converted.   Attempt at initiating diet on 8/4 was unsuccessful. CT abd/pelvis ordered 8/5.   Assessment and Plan: Acute on chronic alcoholic pancreatitis, alcoholic gastritis:  - Had recurrence of abdominal pain with clears 8/4, back to NPO. CT abd/pelvis with mild acute changes on chronic pancreatitis, exam improved, trial clears again 8/6. Would start postpyloric feeding if fails diet advancement. - Continue PPI, carafate, and avoiding NSAIDs. Consider GI evaluation if failing to progress.  - Continue IVF. Leukocytosis and reactive thrombocytosis have resolved.  - Has required hospitalization in the past for this, last was April 2023, usually requires several days, up to 10 days.  - Continue IV dilaudid and IV antiemetics for now.  - Continue creon. Continue imodium w/negative infectious work up.   Paroxysmal atrial fibrillation with RVR: Has returned to NSR durably with frequent aberrantly conducted PACs.  - Brief, low cardioembolic risk and no ongoing anticoagulation recommended per cardiology.  - Given history of WPW, said to be s/p ablation, cardiology was consulted, recommends continuing beta blocker and arranging cardiac monitor at discharge.  -  Continue metoprolol succinate '100mg'$ , due to HTN will also add norvasc '5mg'$ . - Continue balanced IVF w/K (level 3.6) and magnesium daily supplementation for hypomagnesemia. Recheck in AM.  Pulmonary nodule: 3 mm in LUL.  - Recommend 1 year follow-up CT  Alcohol use disorder, severe, dependence: Normally 6-12 beers daily. Last drink ~2 days PTA. No withdrawal noted during hospitalization. - Consulted CSW to provide resources, has had good experience with [facility] in the past.   Essential hypertension - Continue metoprolol.  Tobacco use:  - Cessation counseling, nicotine patch ordered.  Emphysema: Noted radiologically. No wheezing, etc.  - prn albuterol - Recommend formal PFTs after discharge.   AKI: SCr up to 1.17, improved to 0.70 with IV fluids.   NAGMA: Suspected to be due to GI losses, improving.  - Continue monitoring.   Hypoalbuminemia: BMI 20.  - Dietitian consulted  Normocytic anemia: No reported bleeding.  - Monitor intermittently.  Subjective: Abdominal pain better, ready to try clear liquids again. No vomiting. Diarrhea stable.  Objective: BP (!) 170/103 (BP Location: Right Arm)   Pulse 72   Temp 97.9 F (36.6 C) (Oral)   Resp 20   Ht '5\' 8"'$  (1.727 m)   Wt 62.3 kg   SpO2 100%   BMI 20.88 kg/m   Gen: 51 y.o. male in no distress Pulm: Nonlabored breathing room air. Clear. CV: Regular rate and rhythm. No murmur, rub, or gallop. No JVD, no dependent edema. GI: Abdomen soft, mildly tender to epigastric palpation, no rebound, non-distended, with normoactive bowel sounds.  Ext: Warm, no deformities Skin: No rashes, lesions or ulcers on visualized skin. Neuro: Alert and oriented. No focal neurological deficits. Psych: Judgement and insight appear fair. Mood euthymic & affect  congruent. Behavior is appropriate.    Data Personally reviewed:   GI pathogen panel negative, CDiff negative  ECHO 12/07/2021 LVEF 65-70%, no RWMA, G1DD, RV normal size and function, normal  PASP.    CT Angio Chest 12/06/2021 - No evidence of pulmonary embolus or acute airspace opacity.  - SPN LUL 3 mm.  - Aortic Atherosclerosis - Emphysema   CT Abd/pelvis 8/5: Indistinct peripancreatic fat planes suggestive of acute pancreatitis with chronic pancreatic calcifications and slightly smaller pseudocysts. Stable gastric thickening also noted.  Disposition: Status is: Inpatient because: Requires IV fluids and medications for acute pancreatitis Planned Discharge Destination: Home  [name], MD 12/12/2021 3:38 PM Page by [email or web address]

## 2021-12-13 ENCOUNTER — Inpatient Hospital Stay (HOSPITAL_COMMUNITY): Payer: Medicaid Other

## 2021-12-13 DIAGNOSIS — R1013 Epigastric pain: Secondary | ICD-10-CM | POA: Diagnosis not present

## 2021-12-13 DIAGNOSIS — I456 Pre-excitation syndrome: Secondary | ICD-10-CM | POA: Diagnosis not present

## 2021-12-13 DIAGNOSIS — K859 Acute pancreatitis without necrosis or infection, unspecified: Secondary | ICD-10-CM | POA: Diagnosis not present

## 2021-12-13 DIAGNOSIS — I480 Paroxysmal atrial fibrillation: Secondary | ICD-10-CM | POA: Diagnosis not present

## 2021-12-13 LAB — BASIC METABOLIC PANEL
Anion gap: 9 (ref 5–15)
BUN: <5 mg/dL — ABNORMAL LOW (ref 6–20)
CO2: 22 mmol/L (ref 22–32)
Calcium: 7.6 mg/dL — ABNORMAL LOW (ref 8.9–10.3)
Chloride: 109 mmol/L (ref 98–111)
Creatinine, Ser: 0.55 mg/dL — ABNORMAL LOW (ref 0.61–1.24)
GFR, Estimated: >60 mL/min (ref >60)
Glucose, Bld: 66 mg/dL — ABNORMAL LOW (ref 70–99)
Potassium: 4.8 mmol/L (ref 3.5–5.1)
Sodium: 140 mmol/L (ref 135–145)

## 2021-12-13 MED ORDER — PANTOPRAZOLE SODIUM 40 MG PO TBEC
40.0000 mg | DELAYED_RELEASE_TABLET | Freq: Two times a day (BID) | ORAL | Status: DC
  Administered 2021-12-13 – 2021-12-24 (×22): 40 mg via ORAL
  Filled 2021-12-13 (×21): qty 1

## 2021-12-13 MED ORDER — AMLODIPINE BESYLATE 10 MG PO TABS
10.0000 mg | ORAL_TABLET | Freq: Every day | ORAL | Status: DC
  Administered 2021-12-14 – 2021-12-24 (×11): 10 mg via ORAL
  Filled 2021-12-13 (×11): qty 1

## 2021-12-13 MED ORDER — DEXTROSE IN LACTATED RINGERS 5 % IV SOLN
INTRAVENOUS | Status: DC
  Filled 2021-12-13: qty 1000

## 2021-12-13 MED ORDER — VITAL 1.5 CAL PO LIQD
1000.0000 mL | ORAL | Status: DC
  Administered 2021-12-14 – 2021-12-20 (×6): 1000 mL
  Filled 2021-12-13 (×11): qty 1000

## 2021-12-13 MED ORDER — FREE WATER
30.0000 mL | Status: DC
  Administered 2021-12-14 – 2021-12-20 (×34): 30 mL

## 2021-12-13 MED ORDER — PROSOURCE TF PO LIQD
45.0000 mL | Freq: Two times a day (BID) | ORAL | Status: DC
Start: 2021-12-14 — End: 2021-12-15
  Administered 2021-12-14 (×2): 45 mL
  Filled 2021-12-13 (×3): qty 45

## 2021-12-13 MED ORDER — FUROSEMIDE 10 MG/ML IJ SOLN
INTRAMUSCULAR | Status: AC
  Filled 2021-12-13: qty 4

## 2021-12-13 MED ORDER — LOPERAMIDE HCL 2 MG PO CAPS
2.0000 mg | ORAL_CAPSULE | Freq: Two times a day (BID) | ORAL | Status: DC
  Administered 2021-12-13 – 2021-12-15 (×4): 2 mg via ORAL
  Filled 2021-12-13 (×5): qty 1

## 2021-12-13 NOTE — Progress Notes (Signed)
Nutrition Follow-up  DOCUMENTATION CODES:   Severe malnutrition in context of chronic illness  INTERVENTION:   Vital 1.5 $RemoveB'@65ml'PBdchojy$ /hr- Initiate at 80ml/hr and increase by 16ml/hr q 8 hours until goal rate is reached.   Pro-Source 68ml BID via tube, provides 40kcal and 11g of protein per serving  Free water flushes 67ml q4 hours to maintain tube patency   Regimen provides 2420kcal/day, 127g/day protein and 1313ml/day of free water  Pt at high refeed risk; recommend monitor potassium, magnesium and phosphorus labs daily until stable  MVI, thiamine and folic acid po daily  Boost Breeze po TID, each supplement provides 250 kcal and 9 grams of protein  Daily weights  NUTRITION DIAGNOSIS:   Severe Malnutrition related to chronic illness (chronic pancreatitis, substance abuse) as evidenced by severe fat depletion, severe muscle depletion. -new diagnosis  GOAL:   Patient will meet greater than or equal to 90% of their needs  MONITOR:   PO intake, Supplement acceptance, Labs, Weight trends, TF tolerance, Skin, I & O's  REASON FOR ASSESSMENT:   Consult Assessment of nutrition requirement/status  ASSESSMENT:   51 yo male admitted with afib with RVR, acute on chronic alcoholic pancreatitis. PMH chronic pancreatitis on PERT, [name] White s/p ablation, mood disorder-major depressive disorder, PTSD, bipolar, HTN, asthma, EtOH dependence, cocaine use, tobacco use  Met with pt in room today. Pt reports decreased appetite and oral intake at baseline but reports his oral intake has been poor since admission r/t abdominal pain and diarrhea. Pt has remained on NPO/clear liquid diet for most of his admission and is now without adequate nutrition for > 7 days. Pt s/p post-pyloric cortrak tube placement today. Plan is to initiate tube feeds. Pt is at high refeed risk. No new weight since admit; will order daily weights.   Medications reviewed and include: lovenox, folic acid, creon, Mg  oxide, imodium, MVI, nicotine, protonix, carafate, thiamine, LRS w/ 5% dextrose $RemoveBef'@100ml'WrHBTBZfea$ /hr  Labs reviewed: K 4.8 wnl, BUN <6(L), creat 0.55(L), Mg 1.4(L) Lipase 23  NUTRITION - FOCUSED PHYSICAL EXAM:  Flowsheet Row Most Recent Value  Orbital Region Moderate depletion  Upper Arm Region Severe depletion  Thoracic and Lumbar Region Moderate depletion  Buccal Region Moderate depletion  Temple Region Moderate depletion  Clavicle Bone Region Severe depletion  Clavicle and Acromion Bone Region Severe depletion  Scapular Bone Region Moderate depletion  Dorsal Hand Moderate depletion  Patellar Region Severe depletion  Anterior Thigh Region Severe depletion  Posterior Calf Region Severe depletion  Edema (RD Assessment) None  Hair Reviewed  Eyes Reviewed  Mouth Reviewed  Skin Reviewed  Nails Reviewed   Diet Order:   Diet Order             Diet NPO time specified Except for: Sips with Meds  Diet effective now                  EDUCATION NEEDS:   Education needs have been addressed  Skin:  Skin Assessment: Reviewed RN Assessment  Last BM:  8/2  Height:   Ht Readings from Last 1 Encounters:  12/06/21 $RemoveB'5\' 8"'AligTmeN$  (1.727 m)    Weight:   Wt Readings from Last 1 Encounters:  12/06/21 62.3 kg   BMI:  Body mass index is 20.88 kg/m.  Estimated Nutritional Needs:   Kcal:  3500-9381 kcals  Protein:  115-130 g  Fluid:  >/= 2 L  Koleen Distance MS, RD, LDN Please refer to [facility] for RD and/or RD on-call/weekend/after hours pager

## 2021-12-13 NOTE — Plan of Care (Signed)
  Problem: Coping: Goal: Level of anxiety will decrease Outcome: Progressing   Problem: Elimination: Goal: Will not experience complications related to urinary retention Outcome: Progressing   Problem: Safety: Goal: Ability to remain free from injury will improve Outcome: Progressing   

## 2021-12-13 NOTE — Progress Notes (Signed)
Progress Note  Patient: [name] [id number] DOB: August 25, 1970  DOA: 12/05/2021  DOS: 12/13/2021    Brief hospital course: [name] is a 51 y.o. male with a history of WPW s/p ablation, substance use, alcoholic pancreatitis, HTN, and seizure disorder who presented to the ED with abdominal pain, nausea and vomiting as well as more acute chest heaviness and dyspnea. He has continued drinking alcohol. He was afebrile in the ED with newly diagnosed atrial fibrillation with RVR with some aberrancy. CTA chest was negative for PE, showed stable fluid collections, suspected pseudocysts near pancreas. He was admitted for recurrent acute on chronic pancreatitis with suspected hypovloemia-induced SVT, and brief AFib that has converted.   Attempt at initiating diet on 8/4 was unsuccessful. CT abd/pelvis ordered 8/5.   Assessment and Plan: Acute on chronic alcoholic pancreatitis, alcoholic gastritis:  - Had recurrence of abdominal pain with clears 8/4 and again on rechallenge 8/6. Will take back to NPO, request cortrak for post-pyloric feeding.  - GI consult requested due to failure to improve. I appreciate the assistance.  - Continue PPI, carafate, and avoiding NSAIDs.  - Continue IVF. Leukocytosis and reactive thrombocytosis have resolved.  - Has required hospitalization in the past for this, last was April 2023, usually requires several days, up to 10 days.  - Continue IV dilaudid and IV antiemetics for now.  - Continue creon. Continue imodium w/negative infectious work up.   Paroxysmal atrial fibrillation with RVR: Has returned to NSR durably with frequent aberrantly conducted PACs.  - Brief, low cardioembolic risk and no ongoing anticoagulation recommended per cardiology.  - Given history of WPW, said to be s/p ablation, cardiology was consulted, recommends continuing beta blocker and arranging cardiac monitor at discharge.  - Continue metoprolol succinate '100mg'$ , due to HTN  also added norvasc '5mg'$  on 8/6. Will increase to '10mg'$ . - Continue balanced IVF, will DC K supplement, level is 4.8. Add dextrose, glucose 66 this AM.   Pulmonary nodule: 3 mm in LUL.  - Recommend 1 year follow-up CT  Alcohol use disorder, severe, dependence: Normally 6-12 beers daily. Last drink ~2 days PTA. No withdrawal noted during hospitalization. - Consulted CSW to provide resources, has had good experience with [facility] in the past.   Essential hypertension - Continue metoprolol.  Tobacco use:  - Cessation counseling, nicotine patch ordered.  Emphysema: Noted radiologically. No wheezing, etc.  - prn albuterol - Recommend formal PFTs after discharge.   AKI: SCr up to 1.17, improved to 0.70 with IV fluids.   NAGMA: Suspected to be due to GI losses, improving.  - Continue monitoring.   Hypoalbuminemia: BMI 20.  - Dietitian consulted  Normocytic anemia: No reported bleeding.  - Monitor intermittently.  Subjective: Again has worsening burning pain in upper midabdomen, lower chest yesterday with broth, diarrhea also worsened. No vomiting. Still using dilaudid very frequently.  Objective: BP (!) 151/100   Pulse 85   Temp 98 F (36.7 C)   Resp (!) 21   Ht '5\' 8"'$  (1.727 m)   Wt 62.3 kg   SpO2 98%   BMI 20.88 kg/m   Gen: Thin 51 y.o. male in no distress Pulm: Nonlabored breathing room air. Clear. CV: Regular rate and rhythm. No murmur, rub, or gallop. No JVD, no dependent edema. GI: Abdomen soft, tender in epigastrium without rebound or guarding, +BS  Ext: Warm, no deformities Skin: No rashes, lesions or ulcers on visualized skin. Neuro: Alert and oriented. No focal neurological deficits. Psych: Judgement and  insight appear fair. Mood euthymic & affect congruent. Behavior is appropriate.    Data Personally reviewed:   GI pathogen panel negative, CDiff negative  ECHO 12/07/2021 LVEF 65-70%, no RWMA, G1DD, RV normal size and function, normal PASP.    CT Angio Chest  12/06/2021 - No evidence of pulmonary embolus or acute airspace opacity.  - SPN LUL 3 mm.  - Aortic Atherosclerosis - Emphysema   CT Abd/pelvis 8/5: Indistinct peripancreatic fat planes suggestive of acute pancreatitis with chronic pancreatic calcifications and slightly smaller pseudocysts. Stable gastric thickening also noted.  Disposition: Status is: Inpatient because: Requires IV fluids and medications for acute pancreatitis, starting cortrak 8/7. GI consulted. Planned Discharge Destination: Home  [name], MD 12/13/2021 9:23 AM Page by [email or web address]

## 2021-12-13 NOTE — Procedures (Signed)
Cortrak  Tube Type:  Cortrak - 43 inches Tube Location:  Left nare Initial Placement:  Postpyloric Secured by: Bridle Technique Used to Measure Tube Placement:  Marking at nare/corner of mouth Cortrak Secured At:  95 cm   Cortrak Tube Team Note:  Consult received to place a Cortrak feeding tube.   X-ray is required, abdominal x-ray has been ordered by the Cortrak team. Please confirm tube placement before using the Cortrak tube.   If the tube becomes dislodged please keep the tube and contact the Cortrak team at [email or web address] ([email or web address]) for replacement.  If after hours and replacement cannot be delayed, place a NG tube and confirm placement with an abdominal x-ray.   Koleen Distance MS, RD, LDN Please refer to [facility] for RD and/or RD on-call/weekend/after hours pager

## 2021-12-13 NOTE — Consult Note (Signed)
[facility] Consult: 11:51 AM 12/13/2021  LOS: 6 days    Referring Provider: Dr Bonner Puna  Primary Care Physician:  Pcp, No Primary Gastroenterologist:  Dr. Havery Moros.    Reason for Consultation: Abdominal pain, chronic pancreatitis.   HPI: [name] is a 51 y.o. male.  PMH WPW, status post ablation.  Bipolar disorder with anxiety refractory EtOH use disorder.  Alcoholic pancreatitis, chronic pancreatitis.  Substance abuse.  Seizure disorder.  Hypertension.  GERD.  PUD, Duodenal ulcers.  IDA by labs in April 2023.  Sickle cell trait anemia and rectal bleeding in June 2022.  2 PRBCs in 11/2017. Ongoing alcohol abuse. 11/2019 colonoscopy.  Total of 3 polyps were removed sized 3 to 7 mm.  Nonbleeding internal hemorrhoids.  Random biopsies for eval of microscopic colitis.  Pathology showed 2 hyperplastic and 1 TA polyp.  Random colon biopsies pathology showed no significant pathology. 11/2019 EGD with esophageal candidiasis.  Gastritis.  Duodenitis.  Nonbleeding duodenal ulcer with no stigmata of bleeding.  Pathology showed peptic duodenitis, healing ulcer.  No H. pylori.  Mild chronic gastritis and mild reactive gastropathy. 12/2019 EUS with aspiration of medium sized cyst at body of pancreas.  Cyst had significantly thick walled/rind.  Parenchyma showed changes of chronic pancreatitis, no obvious masses.  Improved appearance of duodenal bulb ulcer.  Cytology showed no malignant cells.  Chronic abd pain owing to pancreatitis.  Dismissed by a previous pain management clinic.  Continues drinking alcohol.  Admission early January 2023 in [facility] to address abdominal pain. Last office visit at [place] was late January 2023 when he was advised to stop all alcohol and continue Creon, PPI, Carafate.  He requested  referral to a different pain management clinic, this does not appear to have occurred.  Dr. Havery Moros declined to prescribe even small amounts of narcotics.  Later pt had difficulty affording Creon. Was able to establish with [facility] pain clinic.  Prescribed Suboxone which caused worsening of nausea, vomiting and abdominal pain.  Sounds like this pain clinic was reluctant to resume patient's previous oxycodone 5/325 6 times daily which had previously successfully controlled his pain symptoms.  Went to alcohol rehab at [facility] in early June 2023.  He went through 7 days of detox and another 7 days of rehab.  Had conflicts with another resident, walked out of the building and [employer] said he did breach his contract and would not let him return.  He resumed drinking though he says not as much as previously, admitting to a couple of beers on a nondaily basis.  Presented over a week ago with abdominal pain, nausea, vomiting, chest heaviness, dyspnea.  Diagnosed with A-fib/RVR.  Suspicion was he had SVT due to hypovolemia, his A-fib was brief and spontaneously converted.  No PE on chest CT. Patient seem to be improving in terms of his abdominal pain but has failed diet advancement beyond clear liquids.  Today hospitalist had planned placement of core track to initiate tube feeds.  The patient reluctant and got very anxious with this plan.  LFTs normal.  Lipase normal.  Hgb 11.9.  MCV 81.  Platelets, WBCs normal INR 1.  Normal BUN/Creat.   C diff, GI path PCR  negative.   12/11/2021 CTAP wo contrast: Indistinct peripancreatic fat planes might represent acute pancreatitis.  Stable, small cystic structures at distal pancreatic body and adjacent tail, slightly smaller than previous and likely chronic pseudocysts.  Pancreatic calcifications consistent with chronic pancreatitis.  Stable chronic gastric wall thickening.  Minor, complicated colon diverticulosis on the left   Admits to consumption of up to 12 beers  daily in the past but 2 beers a day in recent weeks..  Last consumed 2 days PTA.  Previously attended alcohol program at [facility].  Still smoking up to the time of admission. UnEmployed.  Lives with his 10 year old mother in [place].  Past Medical History:  Diagnosis Date   Alcoholism /alcohol abuse    Anemia    Anxiety    Arthritis    "knees; arms; elbows" (03/26/2015)   Asthma    Bipolar disorder (HCC)    Chronic bronchitis (HCC)    Chronic lower back pain    Chronic pancreatitis ([place])    Cocaine abuse ([place])    Depression    Family history of adverse reaction to anesthesia    "grandmother gets confused"   Femoral condyle fracture ([place]) 03/08/2014   left medial/notes 03/09/2014   GERD (gastroesophageal reflux disease)    H/O hiatal hernia    H/O suicide attempt 10/2012   High cholesterol    History of blood transfusion 10/2012   "when I tried to commit suicide"   History of stomach ulcers    Hypertension    Marijuana abuse, continuous    Migraine    "a few times/year" (03/26/2015)   Pneumonia 1990's X 3   PTSD (post-traumatic stress disorder)    Sickle cell trait ([place])    WPW (Wolff-Parkinson-White syndrome)    [name] 03/06/2013    Past Surgical History:  Procedure Laterality Date   BIOPSY  11/25/2017   Procedure: BIOPSY;  Surgeon: Arta Silence, MD;  Location: [place];  Service: Endoscopy;;   BIOPSY  10/14/2018   Procedure: BIOPSY;  Surgeon: Arta Silence, MD;  Location: [place];  Service: Endoscopy;;   CARDIAC CATHETERIZATION     CYST ENTEROSTOMY  01/02/2020   Procedure: CYST ASPIRATION;  Surgeon: Milus Banister, MD;  Location: [facility];  Service: Endoscopy;;   ESOPHAGOGASTRODUODENOSCOPY (EGD) WITH PROPOFOL N/A 11/25/2017   Procedure: ESOPHAGOGASTRODUODENOSCOPY (EGD) WITH PROPOFOL;  Surgeon: Arta Silence, MD;  Location: [place];  Service: Endoscopy;  Laterality: N/A;   ESOPHAGOGASTRODUODENOSCOPY (EGD) WITH PROPOFOL Left 10/14/2018   Procedure:  ESOPHAGOGASTRODUODENOSCOPY (EGD) WITH PROPOFOL;  Surgeon: Arta Silence, MD;  Location: [facility];  Service: Endoscopy;  Laterality: Left;   ESOPHAGOGASTRODUODENOSCOPY (EGD) WITH PROPOFOL N/A 11/14/2018   Procedure: ESOPHAGOGASTRODUODENOSCOPY (EGD) WITH PROPOFOL;  Surgeon: Laurence Spates, MD;  Location: [facility];  Service: Gastroenterology;  Laterality: N/A;   ESOPHAGOGASTRODUODENOSCOPY (EGD) WITH PROPOFOL N/A 01/02/2020   Procedure: ESOPHAGOGASTRODUODENOSCOPY (EGD) WITH PROPOFOL;  Surgeon: Milus Banister, MD;  Location: [facility];  Service: Endoscopy;  Laterality: N/A;   ESOPHAGOGASTRODUODENOSCOPY (EGD) WITH PROPOFOL N/A 10/25/2020   Procedure: ESOPHAGOGASTRODUODENOSCOPY (EGD) WITH PROPOFOL;  Surgeon: Rush Landmark Telford Nab., MD;  Location: [place];  Service: Gastroenterology;  Laterality: N/A;   EUS N/A 01/02/2020   Procedure: UPPER ENDOSCOPIC ULTRASOUND (EUS) RADIAL;  Surgeon: Milus Banister, MD;  Location: [facility];  Service: Endoscopy;  Laterality: N/A;   EYE SURGERY Left  1990's   "result of trauma"    FACIAL FRACTURE SURGERY Left 1990's   "result of trauma"    FLEXIBLE SIGMOIDOSCOPY N/A 10/25/2020   Procedure: FLEXIBLE SIGMOIDOSCOPY;  Surgeon: Irving Copas., MD;  Location: [place];  Service: Gastroenterology;  Laterality: N/A;   FRACTURE SURGERY     HEMOSTASIS CLIP PLACEMENT  10/25/2020   Procedure: HEMOSTASIS CLIP PLACEMENT;  Surgeon: Irving Copas., MD;  Location: [place];  Service: Gastroenterology;;   HERNIA REPAIR     LEFT HEART CATHETERIZATION WITH CORONARY ANGIOGRAM Right 03/07/2013   Procedure: LEFT HEART CATHETERIZATION WITH CORONARY ANGIOGRAM;  Surgeon: [name], MD;  Location: [place] C[facility];  Service: Cardiovascular;  Laterality: Right;   UMBILICAL HERNIA REPAIR     UPPER GASTROINTESTINAL ENDOSCOPY      Prior to Admission medications   Medication Sig Start Date End Date Taking? Authorizing Provider  acetaminophen (TYLENOL)  500 MG tablet Take 500 mg by mouth every 6 (six) hours as needed for moderate pain or headache.   Yes [provider]  albuterol (VENTOLIN HFA) 108 (90 Base) MCG/ACT inhaler Inhale 2 puffs into the lungs every 4 (four) hours as needed for wheezing or shortness of breath. 08/17/21  Yes [name], MD  folic acid (FOLVITE) 1 MG tablet Take 1 tablet (1 mg total) by mouth daily. 11/26/17  Yes [name], MD  lipase/protease/amylase (CREON) 36000 UNITS CPEP capsule Take 2 capsules (72,000 Units total) by mouth 3 (three) times daily with meals. 10/12/21  Yes Willia Craze, NP  magnesium oxide (MAG-OX) 400 (240 Mg) MG tablet Take 1 tablet (400 mg total) by mouth daily. 11/30/21  Yes Davonna Belling, MD  metoprolol (TOPROL-XL) 200 MG 24 hr tablet Take 100 mg by mouth daily. 10/29/20  Yes [provider]  Multiple Vitamin (MULTIVITAMIN WITH MINERALS) TABS tablet Take 1 tablet by mouth daily. 09/06/17  Yes Bonnell Public, MD  omeprazole (PRILOSEC) 40 MG capsule Take 1 capsule (40 mg total) by mouth 2 (two) times daily before a meal. 08/18/21  Yes [name], MD  ondansetron (ZOFRAN) 4 MG tablet Take 1 tablet (4 mg total) by mouth every 6 (six) hours. 11/03/21  Yes Sherrell Puller, PA-C  Polyethyl Glycol-Propyl Glycol (SYSTANE OP) Place 1 drop into both eyes 4 (four) times daily as needed (for dryness).   Yes [provider]  promethazine (PHENERGAN) 25 MG tablet Take 25 mg by mouth every 8 (eight) hours as needed. 11/19/21  Yes [provider]  sucralfate (CARAFATE) 1 g tablet Take 1 tablet (1 g total) by mouth 4 (four) times daily -  with meals and at bedtime. 10/12/21  Yes Willia Craze, NP  thiamine 100 MG tablet Take 1 tablet (100 mg total) by mouth daily. 10/16/18  Yes [name], MD  verapamil (CALAN-SR) 120 MG CR tablet Take 120 mg by mouth at bedtime.   Yes [provider]  Vitamin D, Ergocalciferol, (DRISDOL) 1.25 MG (50000 UNIT) CAPS  capsule Take 50,000 Units by mouth every Wednesday.   Yes [provider]  ondansetron (ZOFRAN-ODT) 4 MG disintegrating tablet Take 1 tablet (4 mg total) by mouth every 8 (eight) hours as needed for vomiting or nausea. Patient not taking: Reported on 12/06/2021 08/17/21   [name], MD  amitriptyline (ELAVIL) 25 MG tablet Take 1 tablet (25 mg total) by mouth at bedtime. Patient not taking: Reported on 08/08/2019 10/15/18 08/08/19  [name], MD  Scheduled Meds:  [START ON 12/14/2021] amLODipine  10 mg Oral Daily   Chlorhexidine Gluconate Cloth  6 each Topical Daily   clotrimazole   Topical BID   enoxaparin (LOVENOX) injection  40 mg Subcutaneous Q24H   feeding supplement  1 Container Oral TID WC   folic acid  1 mg Oral Daily   lipase/protease/amylase  72,000 Units Oral TID WC   magnesium oxide  400 mg Oral Daily   metoprolol succinate  100 mg Oral Daily   multivitamin with minerals  1 tablet Oral Daily   nicotine  21 mg Transdermal QHS   pantoprazole  40 mg Oral Daily   sodium chloride flush  10-40 mL Intracatheter Q12H   sucralfate  1 g Oral TID WC & HS   thiamine  100 mg Oral Daily   Infusions:  dextrose 5% lactated ringers     PRN Meds: acetaminophen, diphenhydrAMINE, hydrALAZINE, HYDROmorphone (DILAUDID) injection, loperamide, LORazepam, ondansetron (ZOFRAN) IV, sodium chloride flush   Allergies as of 12/05/2021 - Review Complete 12/05/2021  Allergen Reaction Noted   Robaxin [methocarbamol] Other (See Comments) 07/20/2016   Aspirin Other (See Comments) 07/31/2018   Shellfish-derived products Nausea And Vomiting and Rash 08/18/2010   Trazodone and nefazodone Other (See Comments) 09/28/2011   Adhesive [tape] Itching 03/17/2018   Latex Itching 03/17/2018   Toradol [ketorolac tromethamine] Other (See Comments) 03/17/2018   Contrast media [iodinated contrast media] Hives 12/04/2016   Reglan [metoclopramide] Other (See Comments) 11/18/2015   Hyman Hopes oil]  Nausea And Vomiting and Rash 08/15/2021    Family History  Problem Relation Age of Onset   Hypertension Mother    Cirrhosis Father    Alcoholism Father    Hypertension Father    Melanoma Father    Hypertension Other    Coronary artery disease Other     Social History   Socioeconomic History   Marital status: Single    Spouse name: Not on file   Number of children: Not on file   Years of education: Not on file   Highest education level: Not on file  Occupational History   Occupation: disabled  Tobacco Use   Smoking status: Every Day    Packs/day: 1.00    Years: 36.00    Total pack years: 36.00    Types: Cigarettes, E-cigarettes   Smokeless tobacco: Never  Vaping Use   Vaping Use: Former  Substance and Sexual Activity   Alcohol use: Not Currently    Comment: last drink a few days ago   Drug use: Not Currently    Types: Marijuana, Cocaine   Sexual activity: Yes    Birth control/protection: None  Other Topics Concern   Not on file  Social History Narrative   ** Merged History Encounter **       Social Determinants of Health   Financial Resource Strain: Not on file  Food Insecurity: Not on file  Transportation Needs: Not on file  Physical Activity: Not on file  Stress: Not on file  Social Connections: Not on file  Intimate Partner Violence: Not on file    REVIEW OF SYSTEMS: Constitutional: No profound weakness or fatigue.  Just generally does not feel well. ENT:  No nose bleeds Pulm: Denies shortness of breath and cough CV:  No palpitations, no LE edema.  No angina GU:  No hematuria, no frequency GI: Dysphagia.  See HPI. Heme: Unusual bleeding or bruising. Transfusions: See HPI. Neuro:  No headaches, no peripheral tingling or numbness.  No  dizziness.  No syncope.  No seizures Derm:  No itching, no rash or sores.  Endocrine:  No sweats or chills.  No polyuria or dysuria Immunization: Not queried. Travel: Not queried.   PHYSICAL EXAM: Vital signs in  last 24 hours: Vitals:   12/13/21 0832 12/13/21 1106  BP: (!) 151/100 (!) 141/97  Pulse: 85   Resp: (!) 21 12  Temp: 98 F (36.7 C) (!) 97.5 F (36.4 C)  SpO2: 98% 98%   Wt Readings from Last 3 Encounters:  12/06/21 62.3 kg  11/29/21 61 kg  11/02/21 61.7 kg    General: Patient somewhat anxious but cooperative.  Looks chronically ill. Head: No facial asymmetry or trauma.  No signs of head trauma. Eyes: No scleral icterus.  No conjunctival pallor Ears: No hearing loss Nose: No congestion or discharge Mouth: Moist, pink, clear mucosa.  Tongue midline. Neck: No JVD, no masses, no thyromegaly Lungs: Clear bilaterally without labored breathing or cough Heart: RRR.  No MRG.  S1, S2 present. Abdomen: Soft.  Not distended.  Active bowel sounds.  Tender throughout but significantly in the upper abdomen.  No guarding or rebound.  No HSM, masses, bruits, hernias..   Rectal: Deferred Musc/Skeltl: Limbs are thin but no joint deformities Extremities: No CCE Neurologic: Oriented x3.  Moves all 4 limbs without tremor or gross weakness. Skin: No rash, no sores, no suspicious lesions on incomplete dermatologic survey Nodes: No cervical adenopathy Psych: Anxious, cooperative.  Fluid speech.  Intake/Output from previous day: 08/06 0701 - 08/07 0700 In: 6244.5 [P.O.:1250; I.V.:4994.5] Out: 2200 [Urine:2200] Intake/Output this shift: No intake/output data recorded.  LAB RESULTS: No results for input(s): "WBC", "HGB", "HCT", "PLT" in the last 72 hours. BMET Lab Results  Component Value Date   NA 140 12/13/2021   NA 137 12/10/2021   NA 137 12/09/2021   K 4.8 12/13/2021   K 3.8 12/10/2021   K 3.6 12/09/2021   CL 109 12/13/2021   CL 110 12/10/2021   CL 111 12/09/2021   CO2 22 12/13/2021   CO2 18 (L) 12/10/2021   CO2 19 (L) 12/09/2021   GLUCOSE 66 (L) 12/13/2021   GLUCOSE 105 (H) 12/10/2021   GLUCOSE 80 12/09/2021   BUN <5 (L) 12/13/2021   BUN <5 (L) 12/10/2021   BUN 7 12/09/2021    CREATININE 0.55 (L) 12/13/2021   CREATININE 0.69 12/10/2021   CREATININE 0.75 12/09/2021   CALCIUM 7.6 (L) 12/13/2021   CALCIUM 8.3 (L) 12/10/2021   CALCIUM 8.5 (L) 12/09/2021   LFT No results for input(s): "PROT", "ALBUMIN", "AST", "ALT", "ALKPHOS", "BILITOT", "BILIDIR", "IBILI" in the last 72 hours. PT/INR Lab Results  Component Value Date   INR 1.0 06/26/2021   INR 1.0 06/07/2021   INR 1.1 10/30/2020   Hepatitis Panel No results for input(s): "HEPBSAG", "HCVAB", "HEPAIGM", "HEPBIGM" in the last 72 hours. C-Diff No components found for: "CDIFF" Lipase     Component Value Date/Time   LIPASE 23 12/06/2021 0013    Drugs of Abuse     Component Value Date/Time   LABOPIA NONE DETECTED 12/06/2021 0442   COCAINSCRNUR NONE DETECTED 12/06/2021 0442   COCAINSCRNUR NEGATIVE 05/05/2009 1507   LABBENZ NONE DETECTED 12/06/2021 0442   LABBENZ NEGATIVE 05/05/2009 1507   AMPHETMU NONE DETECTED 12/06/2021 0442   THCU NONE DETECTED 12/06/2021 0442   LABBARB NONE DETECTED 12/06/2021 0442     RADIOLOGY STUDIES: CT ABDOMEN PELVIS WO CONTRAST  Result Date: 12/11/2021 CLINICAL DATA:  Pancreatitis, acute, severe EXAM:  CT ABDOMEN AND PELVIS WITHOUT CONTRAST TECHNIQUE: Multidetector CT imaging of the abdomen and pelvis was performed following the standard protocol without IV contrast. RADIATION DOSE REDUCTION: This exam was performed according to the departmental dose-optimization program which includes automated exposure control, adjustment of the mA and/or kV according to patient size and/or use of iterative reconstruction technique. COMPARISON:  Noncontrast CT 11/02/2021 FINDINGS: Lower chest: No pleural effusion or confluent airspace disease. The heart is normal in size. Hepatobiliary: No focal hepatic abnormality on this unenhanced exam. Gallbladder physiologically distended, no calcified stone. No biliary dilatation. Pancreas: Diffuse coarse pancreatic calcifications, similar to prior exam.  Low-density lesion in the pancreatic tail measures 15 mm, slightly smaller than on prior exam when it measured 22 mm. Cystic lesion at the pancreatic tail adjacent to the splenic hilum measures 13 mm, previously 14 mm. These likely represent pseudocysts. There is no evidence of new pancreatic collection. Slight indistinctness of peripancreatic fat planes may represent acute pancreatitis. This is mild. No definite pancreatic ductal dilatation. Spleen: Normal in size without focal abnormality. Adrenals/Urinary Tract: No adrenal nodule. No hydronephrosis or renal calculi. No evidence of focal renal lesion on this unenhanced exam. Physiologically distended urinary bladder. Stomach/Bowel: Diffuse gastric thickening is unchanged from prior exam. There is no small bowel obstruction. No small bowel inflammation, although lack of contrast and paucity of intra-abdominal fat limits detailed bowel assessment. Normal appendix courses into the right upper quadrant. Small volume of formed stool throughout the colon. Minor left colonic diverticulosis. No diverticulitis. Vascular/Lymphatic: Aortic atherosclerosis. No aortic aneurysm. No bulky abdominopelvic adenopathy. Reproductive: Prostate is unremarkable. Other: Trace perisplenic free fluid. Trace pericolic gutter free fluid in the right. Trace free fluid in the pelvis. No free air. Small fat containing umbilical hernia. Musculoskeletal: L5-S1 degenerative disc disease. There are no acute or suspicious osseous abnormalities. IMPRESSION: 1. Slight indistinctness of peripancreatic fat planes may represent acute pancreatitis. 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 No evidence of new pancreatic collection. 2. Sequela of chronic pancreatitis with pancreatic calcifications. 3. Chronic gastric thickening, unchanged from prior exam. 4. Minimal left colonic  diverticulosis without diverticulitis. Aortic Atherosclerosis (ICD10-I70.0). Electronically Signed   By: Keith Rake M.D.   On: 12/11/2021 19:45      IMPRESSION:   Acute on chronic abdominal pain in patient with chronic pancreatitis from alcohol abuse.  Has been unable to maintain any significant sobriety.   Patient's previous past pain management was with oxycodone.  Has upcoming appointment with pain management clinic on 8/11    Pancreatic pseudocyst.  12/2019 EUS with aspiration, benign/non-malignant cytology.    Hyperplastic and TA polyps at colonoscopy 11/2019.  Due surveillance colonoscopy 11/2026 per Dr Mickey Farber notes.    Gastritis, duodenitis, nonbleeding duodenal ulcer and esophageal candidiasis at EGD 11/2019.    ETOH use disorder, and substance abuse disorder not in remission.  Was just enrolled w ETOH rehab at [facility] in 10/2021.  Latest tox screen 7/31 negative for several substances.  No ETOH level was obtained at admission  Chronic loose stools.  Patient says Creon has not made an impact.  Stool studies for C. difficile and stool PCR pathogens are all negative.  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  PAF with RVR, short-lived and subsequent PACs.  Radiology does not feel he needs ongoing anticoagulation.  Previous ablation for WPW.  3 mm LUL  pulmonary nodule, planned follow-up CT in 1 year    [place] anemia.      PLAN:       Per Dr Lorenso Courier.    Adding scheduled Imodium 2 mg twice daily.  Could also consider trial of Questran but this is more difficult to tolerate.   Azucena Freed  12/13/2021, 11:51 AM Phone [phone]

## 2021-12-13 NOTE — Progress Notes (Signed)
Cortrak Tube Team Brief Note:  Proximal cortrak tube noted to have excess slack in esophagus on x-ray. Tube was adjusted by backing out 5cm to remove slack. Tube secured at 90cm with nasal bridle. KUB repeated. [name] unable to be seen on second radiograph.   If the tube becomes dislodged please keep the tube and contact the Cortrak team at [email or web address] (password [email or web address]) for replacement.  If after hours and replacement cannot be delayed, place a NG tube and confirm placement with an abdominal x-ray.   Koleen Distance MS, RD, LDN Please refer to [facility] for RD and/or RD on-call/weekend/after hours pager

## 2021-12-13 NOTE — Progress Notes (Signed)
Patient refusing tube feeds, he states he will reconsider tube feedings in am .

## 2021-12-14 DIAGNOSIS — I456 Pre-excitation syndrome: Secondary | ICD-10-CM | POA: Diagnosis not present

## 2021-12-14 DIAGNOSIS — I480 Paroxysmal atrial fibrillation: Secondary | ICD-10-CM | POA: Diagnosis not present

## 2021-12-14 DIAGNOSIS — R1013 Epigastric pain: Secondary | ICD-10-CM | POA: Diagnosis not present

## 2021-12-14 DIAGNOSIS — K859 Acute pancreatitis without necrosis or infection, unspecified: Secondary | ICD-10-CM | POA: Diagnosis not present

## 2021-12-14 LAB — COMPREHENSIVE METABOLIC PANEL
ALT: 16 U/L (ref 0–44)
AST: 22 U/L (ref 15–41)
Albumin: 2.6 g/dL — ABNORMAL LOW (ref 3.5–5.0)
Alkaline Phosphatase: 60 U/L (ref 38–126)
Anion gap: 8 (ref 5–15)
BUN: <5 mg/dL — ABNORMAL LOW (ref 6–20)
CO2: 24 mmol/L (ref 22–32)
Calcium: 8.1 mg/dL — ABNORMAL LOW (ref 8.9–10.3)
Chloride: 108 mmol/L (ref 98–111)
Creatinine, Ser: 0.65 mg/dL (ref 0.61–1.24)
GFR, Estimated: >60 mL/min (ref >60)
Glucose, Bld: 141 mg/dL — ABNORMAL HIGH (ref 70–99)
Potassium: 4.3 mmol/L (ref 3.5–5.1)
Sodium: 140 mmol/L (ref 135–145)
Total Bilirubin: 0.5 mg/dL (ref 0.3–1.2)
Total Protein: 5.1 g/dL — ABNORMAL LOW (ref 6.5–8.1)

## 2021-12-14 LAB — CBC
HCT: 25.4 % — ABNORMAL LOW (ref 39.0–52.0)
Hemoglobin: 7.9 g/dL — ABNORMAL LOW (ref 13.0–17.0)
MCH: 25.5 pg — ABNORMAL LOW (ref 26.0–34.0)
MCHC: 31.1 g/dL (ref 30.0–36.0)
MCV: 81.9 fL (ref 80.0–100.0)
Platelets: 192 10*3/uL (ref 150–400)
RBC: 3.1 MIL/uL — ABNORMAL LOW (ref 4.22–5.81)
RDW: 20.8 % — ABNORMAL HIGH (ref 11.5–15.5)
WBC: 6.7 10*3/uL (ref 4.0–10.5)
nRBC: 0 % (ref 0.0–0.2)

## 2021-12-14 LAB — PHOSPHORUS: Phosphorus: 4.1 mg/dL (ref 2.5–4.6)

## 2021-12-14 LAB — HEMOGLOBIN AND HEMATOCRIT, BLOOD
HCT: 29 % — ABNORMAL LOW (ref 39.0–52.0)
Hemoglobin: 9.3 g/dL — ABNORMAL LOW (ref 13.0–17.0)

## 2021-12-14 LAB — MAGNESIUM: Magnesium: 1 mg/dL — ABNORMAL LOW (ref 1.7–2.4)

## 2021-12-14 MED ORDER — SODIUM CHLORIDE 0.45 % IV SOLN: INTRAVENOUS | Status: DC

## 2021-12-14 MED ORDER — MAGNESIUM SULFATE 4 GM/100ML IV SOLN
4.0000 g | Freq: Once | INTRAVENOUS | Status: AC
  Administered 2021-12-14: 4 g via INTRAVENOUS
  Filled 2021-12-14: qty 100

## 2021-12-14 MED ORDER — PANCRELIPASE (LIP-PROT-AMYL) 36000-114000 UNITS PO CPEP
72000.0000 [IU] | ORAL_CAPSULE | Freq: Three times a day (TID) | ORAL | Status: DC
  Filled 2021-12-14: qty 2

## 2021-12-14 MED ORDER — PANCRELIPASE (LIP-PROT-AMYL) 36000-114000 UNITS PO CPEP
72000.0000 [IU] | ORAL_CAPSULE | Freq: Three times a day (TID) | ORAL | Status: DC
  Administered 2021-12-14 – 2021-12-24 (×29): 72000 [IU] via ORAL
  Filled 2021-12-14 (×32): qty 2

## 2021-12-14 MED ORDER — LIDOCAINE 4 % EX CREA
TOPICAL_CREAM | Freq: Four times a day (QID) | CUTANEOUS | Status: DC | PRN
  Administered 2021-12-14 – 2021-12-21 (×7): 1 via TOPICAL
  Filled 2021-12-14 (×2): qty 5

## 2021-12-14 NOTE — Plan of Care (Signed)
  Problem: Education: Goal: Knowledge of General Education information will improve Description: Including pain rating scale, medication(s)/side effects and non-pharmacologic comfort measures Outcome: Progressing   Problem: Clinical Measurements: Goal: Will remain free from infection Outcome: Progressing Goal: Diagnostic test results will improve Outcome: Progressing Goal: Respiratory complications will improve Outcome: Progressing Goal: Cardiovascular complication will be avoided Outcome: Progressing   Problem: Activity: Goal: Risk for activity intolerance will decrease Outcome: Progressing   Problem: Nutrition: Goal: Adequate nutrition will be maintained Outcome: Progressing   Problem: Coping: Goal: Level of anxiety will decrease Outcome: Progressing   Problem: Pain Managment: Goal: General experience of comfort will improve Outcome: Progressing   Problem: Safety: Goal: Ability to remain free from injury will improve Outcome: Progressing

## 2021-12-14 NOTE — Progress Notes (Signed)
Daily Rounding Note  12/14/2021, 1:24 PM  LOS: 7 days   SUBJECTIVE:   Chief complaint:   abdominal pain   Global abdominal pain persists.  Still having loose stools, multiple times daily.  No blood.  Tube feeds just initiated currently at 25 mL/hour, this is not causing any increase in his GI complaints.  OBJECTIVE:         Vital signs in last 24 hours:    Temp:  [97.5 F (36.4 C)-98.7 F (37.1 C)] 98.7 F (37.1 C) (08/08 1200) Pulse Rate:  [62-86] 86 (08/08 0622) Resp:  [11-20] 15 (08/08 1200) BP: (100-165)/(73-94) 126/88 (08/08 1200) SpO2:  [97 %-100 %] 97 % (08/08 1200) Weight:  [58.5 kg] 58.5 kg (08/08 0622) Last BM Date : 12/13/21 Filed Weights   12/05/21 2214 12/06/21 2100 12/14/21 0622  Weight: 61 kg 62.3 kg 58.5 kg   General: Thin, looks generally unwell but not acutely ill. Heart: RRR. Chest: Clear bilaterally without labored breathing or cough Abdomen: Soft.  Mild to moderate tenderness throughout.  No guarding or rebound. Extremities: Thin arms and legs.  No CCE. Neuro/Psych: Somewhat anxious.  Cooperative.  Fluid speech.  Intake/Output from previous day: 08/07 0701 - 08/08 0700 In: 1271.9 [I.V.:1271.9] Out: 1900 [Urine:1900]  Intake/Output this shift: Total I/O In: 773.4 [I.V.:773.4] Out: -   Lab Results: Recent Labs    12/14/21 0132 12/14/21 1202  WBC 6.7  --   HGB 7.9* 9.3*  HCT 25.4* 29.0*  PLT 192  --    BMET Recent Labs    12/13/21 0103 12/14/21 0132  NA 140 140  K 4.8 4.3  CL 109 108  CO2 22 24  GLUCOSE 66* 141*  BUN <5* <5*  CREATININE 0.55* 0.65  CALCIUM 7.6* 8.1*   LFT Recent Labs    12/14/21 0132  PROT 5.1*  ALBUMIN 2.6*  AST 22  ALT 16  ALKPHOS 60  BILITOT 0.5   PT/INR No results for input(s): "LABPROT", "INR" in the last 72 hours. Hepatitis Panel No results for input(s): "HEPBSAG", "HCVAB", "HEPAIGM", "HEPBIGM" in the last 72  hours.  Studies/Results: DG Abd Portable 1V  Result Date: 12/13/2021 CLINICAL DATA:  Enteric catheter placement EXAM: PORTABLE ABDOMEN - 1 VIEW COMPARISON:  12/13/2021 at 4:05 p.m. FINDINGS: Semi-erect frontal view of the lower chest and upper abdomen demonstrates enteric catheter unchanged since prior study, tip in the region of the ligament of Treitz. Bowel gas pattern is unremarkable. Mild interstitial prominence at the lung bases unchanged. IMPRESSION: 1. Enteric catheter tip projecting in the region of the duodenal jejunal junction. Electronically Signed   By: Randa Ngo M.D.   On: 12/13/2021 18:25   DG Abd Portable 1V  Result Date: 12/13/2021 CLINICAL DATA:  Feeding tube placement. EXAM: PORTABLE ABDOMEN - 1 VIEW COMPARISON:  Chest radiograph December 05, 2021 FINDINGS: Feeding tube is demonstrated coursing inferior to the diaphragm projecting at the level of the DJJ. Additionally, there is a second linear tube projecting along the right aspect of the mediastinum, potentially the proximal aspect of the feeding tube however nonspecific. Recommend clinical correlation. Monitoring leads overlie the patient. Normal cardiac and mediastinal contours. Bilateral interstitial pulmonary opacities. No pleural effusion or pneumothorax. IMPRESSION: Feeding tube tip projects at the level of the DJJ. There is a second linear density along the right aspect of the mediastinum which may represent the proximal aspect of the tube external to the patient. Recommend clinical correlation. Coarse  bilateral interstitial opacities which may represent edema in the appropriate clinical setting. Electronically Signed   By: Lovey Newcomer M.D.   On: 12/13/2021 16:17    Scheduled Meds:  amLODipine  10 mg Oral Daily   Chlorhexidine Gluconate Cloth  6 each Topical Daily   clotrimazole   Topical BID   enoxaparin (LOVENOX) injection  40 mg Subcutaneous Q24H   feeding supplement  1 Container Oral TID WC   feeding supplement  (PROSource TF)  45 mL Per Tube BID   folic acid  1 mg Oral Daily   free water  30 mL Per Tube Q4H   lipase/protease/amylase  72,000 Units Oral TID WC   loperamide  2 mg Oral BID   magnesium oxide  400 mg Oral Daily   metoprolol succinate  100 mg Oral Daily   multivitamin with minerals  1 tablet Oral Daily   nicotine  21 mg Transdermal QHS   pantoprazole  40 mg Oral BID   sodium chloride flush  10-40 mL Intracatheter Q12H   sucralfate  1 g Oral TID WC & HS   thiamine  100 mg Oral Daily   Continuous Infusions:  dextrose 5% lactated ringers 10 mL/hr at 12/14/21 0900   feeding supplement (VITAL 1.5 CAL) 1,000 mL (12/14/21 0946)   magnesium sulfate bolus IVPB 4 g (12/14/21 1210)   PRN Meds:.acetaminophen, diphenhydrAMINE, hydrALAZINE, HYDROmorphone (DILAUDID) injection, loperamide, LORazepam, ondansetron (ZOFRAN) IV, sodium chloride flush  ASSESMENT:     Acute on chronic abd pain, N/V in pt w chronic ETOH induced pancreatitis.      Severe malnutrition.  Intolerance to po.  S/p placement of feeding tube to region of duodenal/jejunal jx.   RD has outlined tube feeding goals of vital 1.5 at 65 mL/hour, Prosource 45 mL BID, free water 30 mL every 4 hours.  RD also notes high risk for refeeding syndrome and to keep an eye on potassium, magnesium, phosphorus.    ETOH use disorder.  Not in remission.      2021 EGD w gastritis, duodenitis, nonbleeding DU, esophageal candidiasis.  Currently on Protonix 40 po bid, Carafate 4 times daily.  Compliant w bid Omeprazole, qid Carafate at home.       Loose stools.  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  On AC Creon.    Anxiety.      Diarrhea.     PLAN     Adding Questran??, increase Creon??, send stool for fat stain??    Azucena Freed  12/14/2021, 1:24 PM Phone [phone]

## 2021-12-14 NOTE — Progress Notes (Signed)
   12/14/21 1230  Clinical Encounter Type  Visited With Patient  Visit Type Initial  Referral From Nurse Jae Dire, RN)  Consult/Referral To Chaplain Melvenia Beam)  Recommendations Request for [place] text   Chaplain provided Mr. [name] a Bible per spiritual Care Consultation request. Susanne Greenhouse., [phone]

## 2021-12-14 NOTE — Progress Notes (Signed)
Brief Nutrition Follow-up:  Cortrak placed yesterday but pt refused TF initiation. This AM RN spoke with pt and pt now amenable to TF.   Tolerating Vital 1.5 at 25 ml/hr this far; plans to titrate by 10 mL q 8 hours until goal rate of 65 ml/hr  Magnesium 1.0 (receiving mag sulfate), potassium and phosphorus wdl  RD to continue to follow  Kerman Passey MS, RDN, LDN, CNSC Registered Dietitian 3 Clinical Nutrition RD Pager and On-Call Pager Number Located in [place]

## 2021-12-14 NOTE — Progress Notes (Signed)
Mobility Specialist Progress Note    12/14/21 1152  Mobility  Activity Ambulated independently in hallway  Level of Assistance Independent  Assistive Device None  Distance Ambulated (ft) 500 ft  Activity Response Tolerated well  $Mobility charge 1 Mobility   Pt received in bed and agreeable. No complaints on walk. Returned to bed with call bell in reach.    Hildred Alamin Mobility Specialist

## 2021-12-14 NOTE — Progress Notes (Addendum)
Progress Note  Patient: [name] ZDG:[id number] DOB: Mar 01, 1971  DOA: 12/05/2021  DOS: 12/14/2021    Brief hospital course: [name] is a 51 y.o. male with a history of WPW s/p ablation, substance use, alcoholic pancreatitis, HTN, and seizure disorder who presented to the ED with abdominal pain, nausea and vomiting as well as more acute chest heaviness and dyspnea. He has continued drinking alcohol. He was afebrile in the ED with newly diagnosed atrial fibrillation with RVR with some aberrancy. CTA chest was negative for PE, showed stable fluid collections, suspected pseudocysts near pancreas. He was admitted for recurrent acute on chronic pancreatitis with suspected hypovloemia-induced SVT, and brief AFib that has converted.   Attempt at initiating diet on 8/4 was unsuccessful. CT abd/pelvis ordered 8/5.   Assessment and Plan: Acute on chronic alcoholic pancreatitis, alcoholic gastritis:  - Had recurrence of abdominal pain with clears 8/4 and again on rechallenge 8/6. Cortrak inserted 8/7, starting tube feeds 8/8.  - GI consult requested due to failure to improve. I appreciate the assistance.  - Continue PPI, carafate, and avoiding NSAIDs.  - Continue IVF. Leukocytosis and reactive thrombocytosis have resolved.  - Has required hospitalization in the past for this, last was April 2023, usually requires several days, up to 10 days.  - Continue IV dilaudid and IV antiemetics for now.  - Continue creon, scheduled. Continue imodium w/negative infectious work up.   Paroxysmal atrial fibrillation with RVR: Has returned to NSR durably with frequent aberrantly conducted PACs.  - Brief, low cardioembolic risk and no ongoing anticoagulation recommended per cardiology.  - Given history of WPW, said to be s/p ablation, cardiology was consulted, recommends continuing beta blocker and arranging cardiac monitor at discharge.  - Continue metoprolol succinate '100mg'$ , due to HTN also  added norvasc '5mg'$  on 8/6. Will increase to '10mg'$ . - Continue balanced IVF, with tube feeds, can DC dextrose  Pulmonary nodule: 3 mm in LUL.  - Recommend 1 year follow-up CT  Alcohol use disorder, severe, dependence: Normally 6-12 beers daily. Last drink ~2 days PTA. No withdrawal noted during hospitalization. - Consulted CSW to provide resources, has had good experience with [facility] in the past.   Severe malnutrition:  - Tube feeds per RD, supplement protein once able  Essential hypertension - Continue metoprolol.  Tobacco use:  - Cessation counseling, nicotine patch ordered.  Emphysema: Noted radiologically. No wheezing, etc.  - prn albuterol - Recommend formal PFTs after discharge.   AKI: SCr up to 1.17, improved to 0.70 with IV fluids.   NAGMA: Suspected to be due to GI losses, improving.  - Continue monitoring.   Hypoalbuminemia: BMI 20.  - Dietitian consulted  Normocytic anemia: No reported bleeding.  - Monitor intermittently.  Hypomagnesemia: Supplement today by IV  Normocytic anemia: Hgb down with hemodilution. This AM, said to be 7.9g/dl, but 9.3 on recheck, so suspect spurious initial value. No bleeding.  - Monitor intermittently, aim to avoid iatrogenic blood loss anemia.  Subjective: Stools more firm yesterday but loose again today, was reluctant to have tube and to start feeds, but amenable. Counseled on policy against vaping in the hospital, amenable. Abdominal pain better since no longer trying clears, but still severe, requiring dilaudid '2mg'$ .   Objective: BP 126/88   Pulse 86   Temp 98.7 F (37.1 C) (Oral)   Resp 15   Ht '5\' 8"'$  (1.727 m)   Wt 58.5 kg   SpO2 97%   BMI 19.61 kg/m   Gen: 51  y.o. male in no distress Pulm: Nonlabored breathing room air. Clear. CV: Regular rate and rhythm. No murmur, rub, or gallop. No JVD, no dependent edema. GI: Abdomen soft, tender in central and left upper abdomen, nondistended, +BS Ext: Warm, no deformities Skin: No  rashes, lesions or ulcers on visualized skin. Neuro: Alert and oriented. No focal neurological deficits. Psych: Judgement and insight appear fair. Mood euthymic & affect congruent. Behavior is appropriate.    Data Personally reviewed:   GI pathogen panel negative, CDiff negative  ECHO 12/07/2021 LVEF 65-70%, no RWMA, G1DD, RV normal size and function, normal PASP.    CT Angio Chest 12/06/2021 - No evidence of pulmonary embolus or acute airspace opacity.  - SPN LUL 3 mm.  - Aortic Atherosclerosis - Emphysema   CT Abd/pelvis 8/5: Indistinct peripancreatic fat planes suggestive of acute pancreatitis with chronic pancreatic calcifications and slightly smaller pseudocysts. Stable gastric thickening also noted.  Disposition: Status is: Inpatient because: Requires IV fluids, tube feeds. Planned Discharge Destination: Home  [name], MD 12/14/2021 3:42 PM Page by [email or web address]

## 2021-12-15 DIAGNOSIS — R1013 Epigastric pain: Secondary | ICD-10-CM | POA: Diagnosis not present

## 2021-12-15 DIAGNOSIS — I456 Pre-excitation syndrome: Secondary | ICD-10-CM | POA: Diagnosis not present

## 2021-12-15 DIAGNOSIS — I480 Paroxysmal atrial fibrillation: Secondary | ICD-10-CM | POA: Diagnosis not present

## 2021-12-15 DIAGNOSIS — K859 Acute pancreatitis without necrosis or infection, unspecified: Secondary | ICD-10-CM | POA: Diagnosis not present

## 2021-12-15 LAB — MAGNESIUM: Magnesium: 1.6 mg/dL — ABNORMAL LOW (ref 1.7–2.4)

## 2021-12-15 LAB — CBC
HCT: 28 % — ABNORMAL LOW (ref 39.0–52.0)
Hemoglobin: 8.8 g/dL — ABNORMAL LOW (ref 13.0–17.0)
MCH: 25.6 pg — ABNORMAL LOW (ref 26.0–34.0)
MCHC: 31.4 g/dL (ref 30.0–36.0)
MCV: 81.4 fL (ref 80.0–100.0)
Platelets: 183 10*3/uL (ref 150–400)
RBC: 3.44 MIL/uL — ABNORMAL LOW (ref 4.22–5.81)
RDW: 20.8 % — ABNORMAL HIGH (ref 11.5–15.5)
WBC: 7.2 10*3/uL (ref 4.0–10.5)
nRBC: 0 % (ref 0.0–0.2)

## 2021-12-15 LAB — COMPREHENSIVE METABOLIC PANEL
ALT: 16 U/L (ref 0–44)
AST: 23 U/L (ref 15–41)
Albumin: 2.9 g/dL — ABNORMAL LOW (ref 3.5–5.0)
Alkaline Phosphatase: 66 U/L (ref 38–126)
Anion gap: 8 (ref 5–15)
BUN: <5 mg/dL — ABNORMAL LOW (ref 6–20)
CO2: 24 mmol/L (ref 22–32)
Calcium: 8.4 mg/dL — ABNORMAL LOW (ref 8.9–10.3)
Chloride: 106 mmol/L (ref 98–111)
Creatinine, Ser: 0.7 mg/dL (ref 0.61–1.24)
GFR, Estimated: >60 mL/min (ref >60)
Glucose, Bld: 82 mg/dL (ref 70–99)
Potassium: 4.8 mmol/L (ref 3.5–5.1)
Sodium: 138 mmol/L (ref 135–145)
Total Bilirubin: 0.4 mg/dL (ref 0.3–1.2)
Total Protein: 5.9 g/dL — ABNORMAL LOW (ref 6.5–8.1)

## 2021-12-15 LAB — PHOSPHORUS: Phosphorus: 4.6 mg/dL (ref 2.5–4.6)

## 2021-12-15 MED ORDER — MAGNESIUM SULFATE 2 GM/50ML IV SOLN
2.0000 g | Freq: Once | INTRAVENOUS | Status: AC
  Administered 2021-12-15: 2 g via INTRAVENOUS
  Filled 2021-12-15: qty 50

## 2021-12-15 MED ORDER — DULOXETINE HCL 30 MG PO CPEP
30.0000 mg | ORAL_CAPSULE | Freq: Every day | ORAL | Status: DC
  Administered 2021-12-15 – 2021-12-24 (×10): 30 mg via ORAL
  Filled 2021-12-15 (×10): qty 1

## 2021-12-15 MED ORDER — PROSOURCE TF20 ENFIT COMPATIBL EN LIQD
60.0000 mL | Freq: Every day | ENTERAL | Status: DC
  Administered 2021-12-15 – 2021-12-22 (×7): 60 mL
  Filled 2021-12-15 (×8): qty 60

## 2021-12-15 MED ORDER — NA FERRIC GLUC CPLX IN SUCROSE 12.5 MG/ML IV SOLN
250.0000 mg | Freq: Every day | INTRAVENOUS | Status: AC
  Administered 2021-12-15 – 2021-12-16 (×2): 250 mg via INTRAVENOUS
  Filled 2021-12-15 (×2): qty 20

## 2021-12-15 NOTE — Progress Notes (Signed)
Daily Rounding Note  12/15/2021, 9:44 AM  LOS: 8 days   SUBJECTIVE:   Chief complaint:  acute on chronic abd pain.      Received 14 Mg Dilaudid yesterday, 6 mg thus far today.   Pt experienced increased abd pain (mostly on L abdomen) after rate went from 25 to 35 Ml/hour.  Hospital MD held TF and then restarted at 15 mL/hour.   No BM yesterday or today.     OBJECTIVE:         Vital signs in last 24 hours:    Temp:  [98.1 F (36.7 C)-98.7 F (37.1 C)] 98.7 F (37.1 C) (08/09 0750) Pulse Rate:  [67-68] 67 (08/09 0322) Resp:  [12-18] 18 (08/09 0750) BP: (116-129)/(71-88) 116/82 (08/09 0750) SpO2:  [94 %-99 %] 97 % (08/09 0750) Weight:  [55.6 kg] 55.6 kg (08/09 0331) Last BM Date : 12/13/21 Filed Weights   12/06/21 2100 12/14/21 0622 12/15/21 0331  Weight: 62.3 kg 58.5 kg 55.6 kg   General: NAD.     Heart: RRR Chest: clear bil Abdomen: soft, ND.  Active BS.   Mild to moderate tenderness is diffuse but >> in epigastrum, LUQ.  Tenderness not as marked when pt distracted by conversation.    Extremities: no CCE Neuro/Psych:  anxious.    Intake/Output from previous day: 08/08 0701 - 08/09 0700 In: 2092.5 [I.V.:1351.3; NG/GT:641.2; IV Piggyback:100] Out: 2650 [Urine:2650]  Intake/Output this shift: No intake/output data recorded.  Lab Results: Recent Labs    12/14/21 0132 12/14/21 1202 12/15/21 0102  WBC 6.7  --  7.2  HGB 7.9* 9.3* 8.8*  HCT 25.4* 29.0* 28.0*  PLT 192  --  183   BMET Recent Labs    12/13/21 0103 12/14/21 0132 12/15/21 0102  NA 140 140 138  K 4.8 4.3 4.8  CL 109 108 106  CO2 '22 24 24  '$ GLUCOSE 66* 141* 82  BUN <5* <5* <5*  CREATININE 0.55* 0.65 0.70  CALCIUM 7.6* 8.1* 8.4*   LFT Recent Labs    12/14/21 0132 12/15/21 0102  PROT 5.1* 5.9*  ALBUMIN 2.6* 2.9*  AST 22 23  ALT 16 16  ALKPHOS 60 66  BILITOT 0.5 0.4   PT/INR No results for input(s): "LABPROT", "INR" in the last  72 hours. Hepatitis Panel No results for input(s): "HEPBSAG", "HCVAB", "HEPAIGM", "HEPBIGM" in the last 72 hours.  Studies/Results: DG Abd Portable 1V  Result Date: 12/13/2021 CLINICAL DATA:  Enteric catheter placement EXAM: PORTABLE ABDOMEN - 1 VIEW COMPARISON:  12/13/2021 at 4:05 p.m. FINDINGS: Semi-erect frontal view of the lower chest and upper abdomen demonstrates enteric catheter unchanged since prior study, tip in the region of the ligament of Treitz. Bowel gas pattern is unremarkable. Mild interstitial prominence at the lung bases unchanged. IMPRESSION: 1. Enteric catheter tip projecting in the region of the duodenal jejunal junction. Electronically Signed   By: Randa Ngo M.D.   On: 12/13/2021 18:25   DG Abd Portable 1V  Result Date: 12/13/2021 CLINICAL DATA:  Feeding tube placement. EXAM: PORTABLE ABDOMEN - 1 VIEW COMPARISON:  Chest radiograph December 05, 2021 FINDINGS: Feeding tube is demonstrated coursing inferior to the diaphragm projecting at the level of the DJJ. Additionally, there is a second linear tube projecting along the right aspect of the mediastinum, potentially the proximal aspect of the feeding tube however nonspecific. Recommend clinical correlation. Monitoring leads overlie the patient. Normal cardiac and mediastinal contours. Bilateral interstitial pulmonary  opacities. No pleural effusion or pneumothorax. IMPRESSION: Feeding tube tip projects at the level of the DJJ. There is a second linear density along the right aspect of the mediastinum which may represent the proximal aspect of the tube external to the patient. Recommend clinical correlation. Coarse bilateral interstitial opacities which may represent edema in the appropriate clinical setting. Electronically Signed   By: Lovey Newcomer M.D.   On: 12/13/2021 16:17    Scheduled Meds:  amLODipine  10 mg Oral Daily   Chlorhexidine Gluconate Cloth  6 each Topical Daily   clotrimazole   Topical BID   enoxaparin (LOVENOX)  injection  40 mg Subcutaneous Q24H   feeding supplement  1 Container Oral TID WC   feeding supplement (PROSource TF20)  60 mL Per Tube Daily   folic acid  1 mg Oral Daily   free water  30 mL Per Tube Q4H   lipase/protease/amylase  72,000 Units Oral TID   loperamide  2 mg Oral BID   metoprolol succinate  100 mg Oral Daily   multivitamin with minerals  1 tablet Oral Daily   nicotine  21 mg Transdermal QHS   pantoprazole  40 mg Oral BID   sodium chloride flush  10-40 mL Intracatheter Q12H   sucralfate  1 g Oral TID WC & HS   thiamine  100 mg Oral Daily   Continuous Infusions:  sodium chloride 75 mL/hr at 12/15/21 0627   feeding supplement (VITAL 1.5 CAL) 65 mL/hr at 12/14/21 1952   PRN Meds:.acetaminophen, diphenhydrAMINE, hydrALAZINE, HYDROmorphone (DILAUDID) injection, lidocaine, loperamide, LORazepam, ondansetron (ZOFRAN) IV, sodium chloride flush   ASSESMENT:     Acute on chronic abd pain in pt w chronic pancreatitis.  Involvement w outpt ain mgt clinic.      Malnutrition, poor po intake.   FT placed to Duod/jejunal jx 8/8.  Rate now decreased as pain increased when rate went up.  Protein supplement    ETOH use disorder.  Not in remission.        2021 EGD w gastritis, duodenitis, nonbleeding DU, esophageal candidiasis.  Currently on Protonix 40 po bid, Carafate 4 times daily.  Compliant w bid Omeprazole, qid Carafate at home.     Loose stools.  On Creon for pancreatic insufficiency.    Diarrhea.   Improved.  Has had 4 doses of loperamide starting 8/7, last dose this AM.   C diff and GI path panel negative a week ago.  Fecal elastase not yet collected.  Continues on Creon.     Normocytic anemia.  Anemia profile in April consistent with IDA.  Received Feraheme infusion mid June 2022.     Anxiety.     PLAN   Ferrlecit infusion x 2 days (d/w pharm D).        Wonder about adding a tricyclic antidepressant or SSRI for pain mgt, this will not work immediately but may provide  some relief after a few weeks.     Inclined to up TF rate to 25 mL/hour.  Ideally would increase rate when pt is sleeping and unaware.  Also may be best if the TF pump screen were turned away from pt so he is not fixated on the rate.  Will d/w MD.      Consider lowering dose of Dilaudid.      Stopped schedule bid loperamide, still available prn.  If recurrent diarrhea, consider restarting scheduled dosing at 1 x daily or QOD.     Azucena Freed  12/15/2021, 9:44  AM Phone [phone]

## 2021-12-15 NOTE — Progress Notes (Signed)
Progress Note  Patient: [name] ZYS:[id number] DOB: 12/12/1970  DOA: 12/05/2021  DOS: 12/15/2021    Brief hospital course: [name] is a 51 y.o. male with a history of WPW s/p ablation, substance use, alcoholic pancreatitis, HTN, and seizure disorder who presented to the ED with abdominal pain, nausea and vomiting as well as more acute chest heaviness and dyspnea. He has continued drinking alcohol. He was afebrile in the ED with newly diagnosed atrial fibrillation with RVR with some aberrancy. CTA chest was negative for PE, showed stable fluid collections, suspected pseudocysts near pancreas. He was admitted for recurrent acute on chronic pancreatitis with suspected hypovloemia-induced SVT, and brief AFib that has converted.   Ultimately required cortrak placement and postpyloric feeds. GI consulted and following.  Assessment and Plan: Acute on chronic alcoholic pancreatitis, alcoholic gastritis:  - Had recurrence of abdominal pain with clears 8/4 and again on rechallenge 8/6. Cortrak inserted 8/7, starting tube feeds 8/8. Pt reported discomfort with tube feeds overnight, though no change to exam. Suspect anxiety may be contributing as he was reluctant to insert and initiate feeds. Will restart at 15cc/hr this AM, advance by 10cc/hr every 8 hours.  - GI consult requested due to failure to improve. I appreciate the assistance.  - Continue PPI, carafate, and avoiding NSAIDs.  - Continue IVF. Leukocytosis and reactive thrombocytosis have resolved.  - Has required hospitalization in the past for this, last was April 2023, usually requires several days, up to 10 days.  - Continue IV dilaudid and IV antiemetics for now. - Continue creon, scheduled. Can continue imodium prn.  Paroxysmal atrial fibrillation with RVR: Has returned to NSR durably with frequent aberrantly conducted PACs.  - Brief, low cardioembolic risk and no ongoing anticoagulation recommended per cardiology.  -  Given history of WPW, said to be s/p ablation, cardiology was consulted, recommends continuing beta blocker and arranging cardiac monitor at discharge.  - Continue metoprolol succinate '100mg'$ , due to HTN also added norvasc, continue this.   Pulmonary nodule: 3 mm in LUL.  - Recommend 1 year follow-up CT  Alcohol use disorder, severe, dependence: Normally 6-12 beers daily. Last drink ~2 days PTA. No withdrawal noted during hospitalization. - Consulted CSW to provide resources, has had good experience with [facility] in the past.   Severe malnutrition:  - Tube feeds per RD, supplement protein once able.   Essential hypertension - Continue metoprolol.  Tobacco use:  - Cessation counseling, nicotine patch ordered. Pt aware of policy against vaping.  Emphysema: Noted radiologically. No wheezing, etc.  - prn albuterol - Recommend formal PFTs after discharge.   AKI: SCr up to 1.17, improved to 0.70 with IV fluids.   NAGMA: Suspected to be due to GI losses, resolved.  Hypoalbuminemia: BMI 20.  - Dietitian consulted  Hypomagnesemia: Supplement again today by IV  Normocytic, hypochromic anemia: Hgb down with hemodilution and stabilized. No bleeding.  - Monitor intermittently, aim to avoid iatrogenic blood loss anemia. - IV iron per GI  Subjective: No BM in past 24 hours, though passing a lot of gas. He felt pain rising up into his chest last night and requested tube feeds be stopped. We've restarted them this morning. Pain is 8/10 and improved with dilaudid.   Objective: BP 116/82   Pulse 67   Temp 98.7 F (37.1 C) (Oral)   Resp 18   Ht '5\' 8"'$  (1.727 m)   Wt 55.6 kg   SpO2 97%   BMI 18.64 kg/m  Gen: 51 y.o. male in no distress Pulm: Nonlabored breathing room air. Clear. CV: Regular rate and rhythm. No murmur, rub, or gallop. No JVD, no dependent edema. GI: Abdomen soft, stable tenderness without rebound in epigastrium/LUQ, elsewhere non-tender, non-distended, with normoactive  bowel sounds.  Ext: Warm, no deformities Skin: No wounds around cortrak, no other rashes, lesions or ulcers on visualized skin. Neuro: Alert and oriented. No focal neurological deficits. Psych: Judgement and insight appear fair. Mood euthymic & affect congruent. Behavior is appropriate.    Data Personally reviewed:   GI pathogen panel negative, CDiff negative  ECHO 12/07/2021 LVEF 65-70%, no RWMA, G1DD, RV normal size and function, normal PASP.    CT Angio Chest 12/06/2021 - No evidence of pulmonary embolus or acute airspace opacity.  - SPN LUL 3 mm.  - Aortic Atherosclerosis - Emphysema   CT Abd/pelvis 8/5: Indistinct peripancreatic fat planes suggestive of acute pancreatitis with chronic pancreatic calcifications and slightly smaller pseudocysts. Stable gastric thickening also noted.  Disposition: Status is: Inpatient because: Requires IV fluids, tube feeds. Planned Discharge Destination: Home  [name], MD 12/15/2021 12:43 PM Page by [email or web address]

## 2021-12-15 NOTE — Progress Notes (Signed)
Patient called out with complaints of sharp pain in abdomen that went up to chest and dull left flank pain.  Patient voices concern that he feels like he is getting too much tube feeding.  Patient current rate is 71m/hr.  MD notified. MD stated to stop tube feeds for now.

## 2021-12-16 DIAGNOSIS — I480 Paroxysmal atrial fibrillation: Secondary | ICD-10-CM | POA: Diagnosis not present

## 2021-12-16 LAB — CBC
HCT: 28.4 % — ABNORMAL LOW (ref 39.0–52.0)
Hemoglobin: 9.1 g/dL — ABNORMAL LOW (ref 13.0–17.0)
MCH: 25.7 pg — ABNORMAL LOW (ref 26.0–34.0)
MCHC: 32 g/dL (ref 30.0–36.0)
MCV: 80.2 fL (ref 80.0–100.0)
Platelets: 173 10*3/uL (ref 150–400)
RBC: 3.54 MIL/uL — ABNORMAL LOW (ref 4.22–5.81)
RDW: 20.8 % — ABNORMAL HIGH (ref 11.5–15.5)
WBC: 6.9 10*3/uL (ref 4.0–10.5)
nRBC: 0 % (ref 0.0–0.2)

## 2021-12-16 LAB — GLUCOSE, CAPILLARY
Glucose-Capillary: 102 mg/dL — ABNORMAL HIGH (ref 70–99)
Glucose-Capillary: 148 mg/dL — ABNORMAL HIGH (ref 70–99)

## 2021-12-16 LAB — COMPREHENSIVE METABOLIC PANEL
ALT: 14 U/L (ref 0–44)
AST: 20 U/L (ref 15–41)
Albumin: 2.9 g/dL — ABNORMAL LOW (ref 3.5–5.0)
Alkaline Phosphatase: 66 U/L (ref 38–126)
Anion gap: 5 (ref 5–15)
BUN: 5 mg/dL — ABNORMAL LOW (ref 6–20)
CO2: 23 mmol/L (ref 22–32)
Calcium: 8.6 mg/dL — ABNORMAL LOW (ref 8.9–10.3)
Chloride: 106 mmol/L (ref 98–111)
Creatinine, Ser: 0.83 mg/dL (ref 0.61–1.24)
GFR, Estimated: >60 mL/min (ref >60)
Glucose, Bld: 97 mg/dL (ref 70–99)
Potassium: 4.6 mmol/L (ref 3.5–5.1)
Sodium: 134 mmol/L — ABNORMAL LOW (ref 135–145)
Total Bilirubin: 0.4 mg/dL (ref 0.3–1.2)
Total Protein: 5.9 g/dL — ABNORMAL LOW (ref 6.5–8.1)

## 2021-12-16 LAB — PHOSPHORUS: Phosphorus: 3.9 mg/dL (ref 2.5–4.6)

## 2021-12-16 LAB — MAGNESIUM: Magnesium: 1.7 mg/dL (ref 1.7–2.4)

## 2021-12-16 NOTE — Progress Notes (Signed)
Mobility Specialist - Progress Note   12/16/21 1120  Mobility  Activity Ambulated independently in hallway  Level of Assistance Independent  Assistive Device None  Distance Ambulated (ft) 800 ft  Activity Response Tolerated well  $Mobility charge 1 Mobility   Pt received in bed agreeable to mobility. No complaints. Left sitting EOB w/ call bell in reach.   Paulla Dolly Mobility Specialist

## 2021-12-16 NOTE — TOC Initial Note (Signed)
Transition of Care [facility]) - Initial/Assessment Note    Patient Details  Name: [name] MRN: [id number] Date of Birth: [birth year]  Transition of Care [facility]) CM/SW Contact:    Angelita Ingles, RN Phone [phone]  12/16/2021, 4:19 PM  Clinical Narrative:                 TOC following patient with high risk for readmission. CM at bedside introduced self and explained roll. Patient states that he is from home where he lives with his mother and normally functions independently. Patient states that he has no PCP but is hoping to get set up with one prior to discharge. Patient states that he does take his medication but does not give name of pharmacy. Patient states that he can not afford his creon which is what is needed for his pancreatitis. Patient states that medicaid does not cover and the medication would cost him $3000 for a 30 day supply. CM has initiated a benefits check for the cost. Patient states that he uses public transportation to get around. Patient states that he is currently living with his mother but would eventually like to get his own places. Patient requesting resources for housing. CM will deliver resources. Patient states that he has no DME at home and would like to know if he could possibly get a wheelchair. CM made patient aware that PT would have to give a recommendation for wheelchair. Patient is currently ambulating around the room and in the hallway independently with no assistive devices. TOC will continue to follow for disposition needs.  Expected Discharge Plan: Home/Self Care Barriers to Discharge: Continued Medical Work up   Patient Goals and CMS Choice Patient states their goals for this hospitalization and ongoing recovery are:: Wants the pain to get better CMS Medicare.gov Compare Post Acute Care list provided to::  (n/a) Choice offered to / list presented to : NA  Expected Discharge Plan and Services Expected Discharge Plan: Home/Self  Care In-house Referral: NA Discharge Planning Services: CM Consult Post Acute Care Choice: NA Living arrangements for the past 2 months: Single Family Home                 DME Arranged: N/A DME Agency: NA       HH Arranged: NA          Prior Living Arrangements/Services Living arrangements for the past 2 months: Single Family Home Lives with:: Parents Patient language and need for interpreter reviewed:: Yes Do you feel safe going back to the place where you live?: Yes      Need for Family Participation in Patient Care: No (Comment) Care giver support system in place?: Yes (comment) Current home services:  (n/a) Criminal Activity/Legal Involvement Pertinent to Current Situation/Hospitalization: No - Comment as needed  Activities of Daily Living Home Assistive Devices/Equipment: None ADL Screening (condition at time of admission) Patient's cognitive ability adequate to safely complete daily activities?: Yes Is the patient deaf or have difficulty hearing?: No Does the patient have difficulty seeing, even when wearing glasses/contacts?: No Does the patient have difficulty concentrating, remembering, or making decisions?: No Patient able to express need for assistance with ADLs?: Yes Does the patient have difficulty dressing or bathing?: No Independently performs ADLs?: Yes (appropriate for developmental age) Does the patient have difficulty walking or climbing stairs?: No Weakness of Legs: None Weakness of Arms/Hands: None  Permission Sought/Granted Permission sought to share information with : Family Supports Permission granted to share information with :  No              Emotional Assessment Appearance:: Appears older than stated age Attitude/Demeanor/Rapport: Engaged Affect (typically observed): Accepting, Hopeful Orientation: : Oriented to Self, Oriented to Place, Oriented to  Time, Oriented to Situation Alcohol / Substance Use: Not Applicable Psych Involvement:  No (comment)  Admission diagnosis:  WPW (Wolff-Parkinson-White syndrome) [I45.6] Acute recurrent pancreatitis [K85.90] Patient Active Problem List   Diagnosis Date Noted   Normal anion gap metabolic acidosis 25/09/3974   Hypoalbuminemia 12/08/2021   Acute recurrent pancreatitis 12/07/2021   WPW (Wolff-Parkinson-White syndrome) 12/06/2021   Paroxysmal atrial fibrillation with RVR (HCC) 12/06/2021   Pulmonary nodule 12/06/2021   Leukocytosis 06/07/2021   Epigastric pain 73/41/9379   Acute alcoholic pancreatitis 02/40/9735   Urinary retention    Protein-calorie malnutrition, severe 11/17/2020   Acute pancreatitis 11/15/2020   Hypomagnesemia    AKI (acute kidney injury) ([place]) 11/13/2018   Seizure ([place]) 11/13/2018   Acute on chronic pancreatitis ([place]) 09/28/2017   Abdominal pain 05/27/2017   Nausea & vomiting 03/18/2017   Diarrhea 03/18/2017   Gastroesophageal reflux disease    Normocytic anemia 12/05/2016   Alcohol use disorder, severe, dependence ([place]) 07/25/2016   Cocaine use disorder, severe, dependence ([place]) 07/25/2016   Major depressive disorder, recurrent severe without psychotic features ([place]) 07/20/2016   Chronic pancreatitis ([place]) 05/18/2015   Essential hypertension 02/06/2014   Mood disorder ([place]) 02/06/2014   Pancreatic pseudocyst/cyst 11/25/2013   [name] White pattern seen on electrocardiogram 10/03/2012   Tobacco abuse 03/23/2007   PCP:  Merryl Hacker, No Pharmacy:   [facility] [place] [address] Phone: [phone] Fax: [phone]  [id number] [address] [place] [address] Phone: [phone] Fax: [phone]  [facility] [address] [address] Phone: [phone] Fax: [phone]  [facility] [place]  [address] [place] [address] [place] [address] Phone: [phone] Fax: [phone]     Social Determinants of Health (SDOH) Interventions    Readmission Risk Interventions    12/16/2021    4:07 PM 08/18/2021   10:38 AM 06/08/2021   11:22 AM  Readmission Risk Prevention Plan  Transportation Screening Complete Complete Complete  Medication Review (RN Care Manager) Referral to Pharmacy Complete Referral to Pharmacy  PCP or Specialist appointment within 3-5 days of discharge Complete Complete Not Complete  PCP/Specialist Appt Not Complete comments   patient not ready to d/c  HRI or [place] Complete Complete Complete  SW Recovery Care/Counseling Consult Complete Complete Complete  Palliative Care Screening Not Applicable Not Applicable Not [place] Not Applicable Not Applicable Not Applicable

## 2021-12-16 NOTE — Progress Notes (Signed)
Progress Note  Patient: [name] [id number]:[id number] DOB: [birth year]  DOA: 12/05/2021  DOS: 12/16/2021    Brief hospital course: [name] is a 51 y.o. male with a history of WPW s/p ablation, substance use, alcoholic pancreatitis, HTN, and seizure disorder who presented to the ED with abdominal pain, nausea and vomiting as well as more acute chest heaviness and dyspnea. He has continued drinking alcohol. He was afebrile in the ED with newly diagnosed atrial fibrillation with RVR with some aberrancy. CTA chest was negative for PE, showed stable fluid collections, suspected pseudocysts near pancreas. He was admitted for recurrent acute on chronic pancreatitis with suspected hypovloemia-induced SVT, and brief AFib that has converted.   Ultimately required cortrak placement and postpyloric feeds. GI consulted and following. 8/10 this a.m. patient requested we advance his diet.  Per attending did not feel comfortable due to ongoing abdominal discomfort.  He conveys that he had epigastric pain to me.  Some nausea.  No other complaints  Assessment and Plan: Acute on chronic alcoholic pancreatitis, alcoholic gastritis:  - Had recurrence of abdominal pain with clears 8/4 and again on rechallenge 8/6. Cortrak inserted 8/7, starting tube feeds 8/8. Pt reported discomfort with tube feeds overnight, though no change to exam. Suspect anxiety may be contributing as he was reluctant to insert and initiate feeds. Will restart at 15cc/hr this AM, advance by 10cc/hr every 8 hours.  - GI consult requested due to failure to improve. I appreciate the assistance.  - Continue IVF. Leukocytosis and reactive thrombocytosis have resolved.  - Has required hospitalization in the past for this, last was April 2023, usually requires several days, up to 10 days.  - Continue IV dilaudid and IV antiemetics for now. 8/10 does have some abdominal pain with increased tube feeding rate.  With patient's use of  narcotics patient may be experiencing delayed gastric emptying.   Duloxetine was started by GI to help with additional pain relief    Paroxysmal atrial fibrillation with RVR: Has returned to NSR durably with frequent aberrantly conducted PACs.  - Brief, low cardioembolic risk and no ongoing anticoagulation recommended per cardiology.  - Given history of WPW, said to be s/p ablation, cardiology was consulted, recommends continuing beta blocker and arranging cardiac monitor at discharge.  8/10 continue Toprol-XL    Pulmonary nodule: 3 mm in LUL.  -Recommend 1 year follow-up CT   Alcohol use disorder, severe, dependence: Normally 6-12 beers daily. Last drink ~2 days PTA. No withdrawal noted during hospitalization. - Consulted CSW to provide resources, has had good experience with [facility] in the past.  8/10 counseled patient extensively during hospitalization about alcohol cessation   Severe malnutrition:  - Tube feeds per RD, supplement protein once able.   Essential hypertension - Continue metoprolol.  Tobacco use:  - Cessation counseling, nicotine patch ordered. Pt aware of policy against vaping.  Emphysema: Noted radiologically. No wheezing, etc.  - prn albuterol - Recommend formal PFTs after discharge.   AKI: SCr up to 1.17, improved with IV fluids.   NAGMA: Suspected to be due to GI losses, resolved.  Hypoalbuminemia: BMI 20.  - Dietitian consulted  Hypomagnesemia: supplemented  Normocytic, hypochromic anemia: Hgb down with hemodilution and stabilized. No bleeding.  - Monitor intermittently, aim to avoid iatrogenic blood loss anemia. - IV iron per GI  Subjective Some diarrhea yesterday. No bm today. No cp or sob  Objective: BP 112/84   Pulse (!) 58   Temp 98.6 F (37 C) (Oral)  Resp 18   Ht '5\' 8"'$  (1.727 m)   Wt 55.7 kg   SpO2 97%   BMI 18.67 kg/m    Calm, NAD Cta no w/r Reg s1/s2 no gallop Soft benign +bs No edema Aaoxox3  Mood and affect  appropriate in current setting   Data Personally reviewed:   GI pathogen panel negative, CDiff negative  ECHO 12/07/2021 LVEF 65-70%, no RWMA, G1DD, RV normal size and function, normal PASP.    CT Angio Chest 12/06/2021 - No evidence of pulmonary embolus or acute airspace opacity.  - SPN LUL 3 mm.  - Aortic Atherosclerosis - Emphysema   CT Abd/pelvis 8/5: Indistinct peripancreatic fat planes suggestive of acute pancreatitis with chronic pancreatic calcifications and slightly smaller pseudocysts. Stable gastric thickening also noted.  Disposition: Status is: Inpatient because: Requires IV fluids, tube feeds. Planned Discharge Destination: Home  Time spent: 65mn  Jason Pong, MD 12/16/2021 9:14 AM Page by [name]

## 2021-12-16 NOTE — Progress Notes (Signed)
Daily Progress Note  Hospital Day: 12  Chief Complaint:  acute on chronic abdominal pain   Brief History [name] is a 51 y.o. male with a pmh not limited to chronic pancreatitis, Etoh use disorder, gastroduodenitis, chronic loose stool   Assessment / Plan   # 51 yo male with acute on chronic abd pain / chronic pancreatitis.    # Malnutrition, poor po intake secondary to above.   FT placed to Duod/jejunal jx 8/8.  Rate now decreased as pain increased when rate went up.  Currently at 35 ml /hor.    # ETOH use disorder.  Not in remission.    # Loose stools.  On Creon for presumed pancreatic insufficiency. Fecal elastase not yet collected. C diff and GI path panel negative a week ago.       # Normocytic anemia.  Anemia profile in April consistent with IDA. Received Feraheme infusion mid June 2022.     Subjective   Tells me that tube feeds had to be put on hold last night due to pain and nausea. Resumed tube feeds today ( currently at 35 ml /hr) and overall okay except for some nausea and mild abdominal discomfort.   Objective  Imaging:  DG Abd Portable 1V  Result Date: 12/13/2021 CLINICAL DATA:  Enteric catheter placement EXAM: PORTABLE ABDOMEN - 1 VIEW COMPARISON:  12/13/2021 at 4:05 p.m. FINDINGS: Semi-erect frontal view of the lower chest and upper abdomen demonstrates enteric catheter unchanged since prior study, tip in the region of the ligament of Treitz. Bowel gas pattern is unremarkable. Mild interstitial prominence at the lung bases unchanged. IMPRESSION: 1. Enteric catheter tip projecting in the region of the duodenal jejunal junction. Electronically Signed   By: Randa Ngo M.D.   On: 12/13/2021 18:25   DG Abd Portable 1V  Result Date: 12/13/2021 CLINICAL DATA:  Feeding tube placement. EXAM: PORTABLE ABDOMEN - 1 VIEW COMPARISON:  Chest radiograph December 05, 2021 FINDINGS: Feeding tube is demonstrated coursing inferior to the diaphragm projecting at the  level of the DJJ. Additionally, there is a second linear tube projecting along the right aspect of the mediastinum, potentially the proximal aspect of the feeding tube however nonspecific. Recommend clinical correlation. Monitoring leads overlie the patient. Normal cardiac and mediastinal contours. Bilateral interstitial pulmonary opacities. No pleural effusion or pneumothorax. IMPRESSION: Feeding tube tip projects at the level of the DJJ. There is a second linear density along the right aspect of the mediastinum which may represent the proximal aspect of the tube external to the patient. Recommend clinical correlation. Coarse bilateral interstitial opacities which may represent edema in the appropriate clinical setting. Electronically Signed   By: Lovey Newcomer M.D.   On: 12/13/2021 16:17    Lab Results: Recent Labs    12/14/21 0132 12/14/21 1202 12/15/21 0102 12/16/21 0117  WBC 6.7  --  7.2 6.9  HGB 7.9* 9.3* 8.8* 9.1*  HCT 25.4* 29.0* 28.0* 28.4*  PLT 192  --  183 173   BMET Recent Labs    12/14/21 0132 12/15/21 0102 12/16/21 0117  NA 140 138 134*  K 4.3 4.8 4.6  CL 108 106 106  CO2 '24 24 23  '$ GLUCOSE 141* 82 97  BUN <5* <5* 5*  CREATININE 0.65 0.70 0.83  CALCIUM 8.1* 8.4* 8.6*   LFT Recent Labs    12/16/21 0117  PROT 5.9*  ALBUMIN 2.9*  AST 20  ALT 14  ALKPHOS 66  BILITOT 0.4  PT/INR No results for input(s): "LABPROT", "INR" in the last 72 hours.   Scheduled inpatient medications:   amLODipine  10 mg Oral Daily   clotrimazole   Topical BID   DULoxetine  30 mg Oral Daily   enoxaparin (LOVENOX) injection  40 mg Subcutaneous Q24H   feeding supplement  1 Container Oral TID WC   feeding supplement (PROSource TF20)  60 mL Per Tube Daily   folic acid  1 mg Oral Daily   free water  30 mL Per Tube Q4H   lipase/protease/amylase  72,000 Units Oral TID   metoprolol succinate  100 mg Oral Daily   multivitamin with minerals  1 tablet Oral Daily   nicotine  21 mg  Transdermal QHS   pantoprazole  40 mg Oral BID   sodium chloride flush  10-40 mL Intracatheter Q12H   sucralfate  1 g Oral TID WC & HS   thiamine  100 mg Oral Daily   Continuous inpatient infusions:   sodium chloride 75 mL/hr at 12/15/21 2208   feeding supplement (VITAL 1.5 CAL) 65 mL/hr at 12/16/21 0610   PRN inpatient medications: acetaminophen, diphenhydrAMINE, hydrALAZINE, HYDROmorphone (DILAUDID) injection, lidocaine, loperamide, LORazepam, ondansetron (ZOFRAN) IV, sodium chloride flush  Vital signs in last 24 hours: Temp:  [97.6 F (36.4 C)-98.7 F (37.1 C)] 97.8 F (36.6 C) (08/10 1559) Pulse Rate:  [56-68] 62 (08/10 1559) Resp:  [14-20] 14 (08/10 1559) BP: (112-124)/(75-85) 116/82 (08/10 1559) SpO2:  [95 %-100 %] 98 % (08/10 1559) Weight:  [55.7 kg] 55.7 kg (08/10 0410) Last BM Date : 12/13/21  Intake/Output Summary (Last 24 hours) at 12/16/2021 1647 Last data filed at 12/16/2021 1600 Gross per 24 hour  Intake 1325.04 ml  Output 550 ml  Net 775.04 ml     Physical Exam:  General: Alert thin male in NAD. Tube feeds in progress Heart:  Regular rate and rhythm. No lower extremity edema Pulmonary: Normal respiratory effort Abdomen: Soft, nondistended, mild mid upper abdominal tenderness. Normal bowel sounds.  Neurologic: Alert and oriented Psych: Pleasant. Cooperative.    Intake/Output from previous day: 08/09 0701 - 08/10 0700 In: 2232.1 [I.V.:1389.4; NG/GT:528.8; IV Piggyback:313.9] Out: 250 [Urine:250] Intake/Output this shift: Total I/O In: -  Out: 300 [Urine:300]    Principal Problem:   Paroxysmal atrial fibrillation with RVR (HCC) Active Problems:   Tobacco abuse   Pancreatic pseudocyst/cyst   Essential hypertension   Alcohol use disorder, severe, dependence (HCC)   Normocytic anemia   Gastroesophageal reflux disease   Diarrhea   Abdominal pain   Acute on chronic pancreatitis (HCC)   AKI (acute kidney injury) ([place])   Hypomagnesemia   Acute  alcoholic pancreatitis   Leukocytosis   WPW (Wolff-Parkinson-White syndrome)   Pulmonary nodule   Acute recurrent pancreatitis   Normal anion gap metabolic acidosis   Hypoalbuminemia     LOS: 9 days   Tye Savoy ,NP 12/16/2021, 4:47 PM

## 2021-12-16 NOTE — Plan of Care (Signed)
  Problem: Clinical Measurements: Goal: Ability to maintain clinical measurements within normal limits will improve Outcome: Progressing Goal: Will remain free from infection Outcome: Progressing Goal: Diagnostic test results will improve Outcome: Progressing Goal: Respiratory complications will improve Outcome: Progressing Goal: Cardiovascular complication will be avoided Outcome: Progressing   Problem: Activity: Goal: Risk for activity intolerance will decrease Outcome: Progressing   Problem: Coping: Goal: Level of anxiety will decrease Outcome: Progressing   Problem: Elimination: Goal: Will not experience complications related to urinary retention Outcome: Progressing   Problem: Pain Managment: Goal: General experience of comfort will improve Outcome: Progressing   Problem: Safety: Goal: Ability to remain free from injury will improve Outcome: Progressing

## 2021-12-16 NOTE — Progress Notes (Signed)
TRH night cross cover note:   I was notified by RN of the patient's request to advance diet to include broth.  Per my chart review, it appears that the patient has failed multiple prior p.o. trials.  Subsequently, I conveyed that I did not feel comfortable attempting to advance the patient's diet overnight after his multiple prior failed p.o. trials.   Of note, patient complaining of some additional abdominal discomfort after increased rate of tube feeds from 15 to 25 cc/h.  existing order for prn IV Dilaudid noted.     Babs Bertin, DO Hospitalist

## 2021-12-17 ENCOUNTER — Telehealth (HOSPITAL_COMMUNITY): Payer: Self-pay | Admitting: Pharmacy Technician

## 2021-12-17 ENCOUNTER — Telehealth: Payer: Self-pay

## 2021-12-17 ENCOUNTER — Other Ambulatory Visit (HOSPITAL_COMMUNITY): Payer: Self-pay

## 2021-12-17 DIAGNOSIS — I480 Paroxysmal atrial fibrillation: Secondary | ICD-10-CM | POA: Diagnosis not present

## 2021-12-17 LAB — GLUCOSE, CAPILLARY
Glucose-Capillary: 107 mg/dL — ABNORMAL HIGH (ref 70–99)
Glucose-Capillary: 107 mg/dL — ABNORMAL HIGH (ref 70–99)
Glucose-Capillary: 121 mg/dL — ABNORMAL HIGH (ref 70–99)
Glucose-Capillary: 126 mg/dL — ABNORMAL HIGH (ref 70–99)
Glucose-Capillary: 144 mg/dL — ABNORMAL HIGH (ref 70–99)
Glucose-Capillary: 84 mg/dL (ref 70–99)
Glucose-Capillary: 86 mg/dL (ref 70–99)

## 2021-12-17 MED ORDER — SIMETHICONE 80 MG PO CHEW: 80.0000 mg | CHEWABLE_TABLET | Freq: Four times a day (QID) | ORAL | Status: DC | PRN

## 2021-12-17 NOTE — Plan of Care (Signed)

## 2021-12-17 NOTE — TOC Benefit Eligibility Note (Signed)
Patient Teacher, English as a foreign language completed.    The patient is currently admitted and upon discharge could be taking Creon 36000 units capsules.  The current 30 day co-pay is $2,109.63.   The patient is insured through [place], [place] Patient Advocate Specialist [place] Patient Advocate Team Direct Number: [phone]  Fax: [phone]

## 2021-12-17 NOTE — Telephone Encounter (Signed)
Pharmacy Patient Advocate Encounter  Insurance verification completed.    The patient is insured through [employer]   The patient is currently admitted and ran test claims for the following: Creon.  Copays and coinsurance results were relayed to Inpatient clinical team.

## 2021-12-17 NOTE — Progress Notes (Signed)
A packet of chewing tobacco found in patients room. Patient asked if he has anything else  in his bags and patient denied. Informed charge nurse, tried to call security but got no one responded. Tobacco taken from patient and put in patient's chart. Patient advised that it is not allowed in the hospital take any tobacco or related products. Patient said this is the only packet he has. Will keep a close eye on the patient.

## 2021-12-17 NOTE — Plan of Care (Signed)
  Problem: Education: Goal: Knowledge of General Education information will improve Description: Including pain rating scale, medication(s)/side effects and non-pharmacologic comfort measures Outcome: Progressing   Problem: Clinical Measurements: Goal: Respiratory complications will improve Outcome: Progressing Goal: Cardiovascular complication will be avoided Outcome: Progressing   Problem: Coping: Goal: Level of anxiety will decrease Outcome: Progressing   Problem: Pain Managment: Goal: General experience of comfort will improve Outcome: Progressing   Problem: Safety: Goal: Ability to remain free from injury will improve Outcome: Progressing   Problem: Skin Integrity: Goal: Risk for impaired skin integrity will decrease Outcome: Progressing

## 2021-12-17 NOTE — Progress Notes (Signed)
Nutrition Follow-up  DOCUMENTATION CODES:   Severe malnutrition in context of chronic illness  INTERVENTION:   Reached out to clarify nutrition poc with GI and Attending. Unclear discharge plan at this time.   Tube Feeding via Cortrak:  Continue to titrate TF to goal rate of 65 ml/hr Vital 1.5 at 65 ml/hr Pro-Source TF20 daily This provides 2420 kcaks, 125 g of protein and 1186 mL of free water  Recommend trial of CL as able  Continue Creon by mouth; may need to consider increasing frequency of dosing given pt is on Continuous TF   NUTRITION DIAGNOSIS:   Severe Malnutrition related to chronic illness (chronic pancreatitis, substance abuse) as evidenced by severe fat depletion, severe muscle depletion.  Being addressed via TF  GOAL:   Patient will meet greater than or equal to 90% of their needs  Progressing  MONITOR:   PO intake, Supplement acceptance, Labs, Weight trends, TF tolerance, Skin, I & O's  REASON FOR ASSESSMENT:   Consult Assessment of nutrition requirement/status  ASSESSMENT:   51 yo male admitted with afib with RVR, acute on chronic alcoholic pancreatitis. PMH chronic pancreatitis on PERT, [name] White s/p ablation, mood disorder-major depressive disorder, PTSD, bipolar, HTN, asthma, EtOH dependence, cocaine use, tobacco use  Pt remains NPO  Tolerating TF at rate of 45 ml/hr via Cortrak on visit today. Some bloating/gas but otherwise ok. Pt currently does not have any medicine ordered to treat gas/bloating; discussed with GI and plan to order simethicone prn  Noted GI has signed off; reviewed GI note from yesterday indicating plan to discharge pt home with TF via Cortrak once TF at goal and pt demonstrating tolerance. Pt was unaware that this might be a possibility. Our facility does not usually discharge patients with Cortraks but TOC will need to be consulted for insurance authorization and to obtain necessary supplies if this is the case. RD  also concerned that pt would not be compliant with administering TF at home given pt was reluctant to even do TF while in the hospital. Concerns were discussed with RN, [facility] and Dr. Marthenia Rolling.   Pt reports he has had 4 BMs today, 1 was formed, the other 3 were loose.   Current wt 55.5 kg; weight appears stable Recently  Labs: reviewed Meds: 1/2 NS at 75 ml/hr, folic acid, creon with meals, imodium prn, MVI with Minerals, thiamine, carafate   Diet Order:   Diet Order             Diet NPO time specified Except for: Sips with Meds  Diet effective now                   EDUCATION NEEDS:   Education needs have been addressed  Skin:  Skin Assessment: Reviewed RN Assessment  Last BM:  8/11  Height:   Ht Readings from Last 1 Encounters:  12/06/21 '5\' 8"'$  (1.727 m)    Weight:   Wt Readings from Last 1 Encounters:  12/17/21 55.5 kg   BMI:  Body mass index is 18.6 kg/m.  Estimated Nutritional Needs:   Kcal:  2300-2500 kcals  Protein:  115-130 g  Fluid:  >/= 2 L   Kerman Passey MS, RDN, LDN, CNSC Registered Dietitian 3 Clinical Nutrition RD Pager and On-Call Pager Number Located in [place]

## 2021-12-17 NOTE — Telephone Encounter (Signed)
Patient is already scheduled for a follow up with Dr. Havery Moros on Wednesday, 01/12/22 at 10:10 am.

## 2021-12-17 NOTE — Progress Notes (Signed)
Progress Note  Patient: [name] YWV:[id number] DOB: 1970-07-27  DOA: 12/05/2021  DOS: 12/17/2021    Brief hospital course: Patient is a 51 year old [name] with past medical history significant for WPW s/p ablation, substance use, alcoholic pancreatitis, HTN, chronic pain (followed up by the pain physician and was recently on Suboxone) and seizure disorder.  Patient presented to the hospital with abdominal pain, nausea and vomiting as well as more acute chest heaviness and dyspnea. He has continued to drink alcohol significantly.  Work-up done revealed acute on chronic pancreatitis, recurrent  .  Other problems on presentation included newly diagnosed atrial fibrillation with RVR with some aberrancy and hypovolemia. CTA chest was negative for PE, showed stable fluid collections, suspected pseudocysts near pancreas.    Due to pain following oral intake, cortrak tube has been placed for postpyloric feeds. GI consulted and following.  12/17/2021: Patient is managed supportively.  Cortrak tube is in place.  Inability to tolerate food orally will likely effect discharge.  Continued input from GI team will be highly appreciated.    Assessment and Plan: Acute on chronic alcoholic pancreatitis, alcoholic gastritis:  - Had recurrence of abdominal pain with clears 8/4 and again on rechallenge 8/6. Cortrak inserted 8/7, starting tube feeds 8/8. Pt reported discomfort with tube feeds overnight, though no change to exam. Suspect anxiety may be contributing as he was reluctant to insert and initiate feeds. Will restart at 15cc/hr this AM, advance by 10cc/hr every 8 hours.  - GI consult requested due to failure to improve. I appreciate the assistance.  - Continue IVF. Leukocytosis and reactive thrombocytosis have resolved.  - Has required hospitalization in the past for this, last was April 2023, usually requires several days, up to 10 days.  - Continue IV dilaudid and IV antiemetics for  now. 8/10 does have some abdominal pain with increased tube feeding rate.  With patient's use of narcotics patient may be experiencing delayed gastric emptying.   Duloxetine was started by GI to help with additional pain relief 12/17/2021: Patient remains on tube feeds.  Inability to tolerate food orally will be new of discharge.  GI team's input is appreciated.  Discussed with the dietary team.  Further management will depend on hospital course.   Paroxysmal atrial fibrillation with RVR:  -Patient has returned to NSR durably with frequent aberrantly conducted PACs.  - Brief, low cardioembolic risk and no ongoing anticoagulation recommended per cardiology.  - Given history of WPW, said to be s/p ablation, cardiology was consulted, recommends continuing beta blocker and arranging cardiac monitor at discharge.  8/11 continue Toprol-XL    Pulmonary nodule: 3 mm in LUL.  -Recommend 1 year follow-up CT   Alcohol use disorder, severe, dependence: Normally 6-12 beers daily. Last drink ~2 days PTA. No withdrawal noted during hospitalization. - Consulted CSW to provide resources, has had good experience with [facility] in the past.  8/11 counseled patient extensively during hospitalization about alcohol cessation   Severe malnutrition:  - Tube feeds per RD, supplement protein once able.   Essential hypertension - Continue metoprolol.  Tobacco use:  - Cessation counseling, nicotine patch ordered. Pt aware of policy against vaping.  Emphysema: Noted radiologically. No wheezing, etc.  - prn albuterol - Recommend formal PFTs after discharge.   AKI: SCr up to 1.17, improved with IV fluids.   NAGMA: Suspected to be due to GI losses, resolved.  Hypoalbuminemia: BMI 20.  - Dietitian consulted  Hypomagnesemia: supplemented  Normocytic, hypochromic anemia: Hgb down with  hemodilution and stabilized. No bleeding.  - Monitor intermittently, aim to avoid iatrogenic blood loss anemia. - IV iron  per GI  Subjective Some diarrhea yesterday. No bm today. No cp or sob  Objective: BP 114/72   Pulse 65   Temp 97.6 F (36.4 C) (Oral)   Resp 17   Ht '5\' 8"'$  (1.727 m)   Wt 55.5 kg   SpO2 96%   BMI 18.60 kg/m    Calm, NAD Cta no w/r Reg s1/s2 no gallop Soft benign +bs No edema Aaoxox3  Mood and affect appropriate in current setting   Data Personally reviewed:   GI pathogen panel negative, CDiff negative  ECHO 12/07/2021 LVEF 65-70%, no RWMA, G1DD, RV normal size and function, normal PASP.    CT Angio Chest 12/06/2021 - No evidence of pulmonary embolus or acute airspace opacity.  - SPN LUL 3 mm.  - Aortic Atherosclerosis - Emphysema   CT Abd/pelvis 8/5: Indistinct peripancreatic fat planes suggestive of acute pancreatitis with chronic pancreatic calcifications and slightly smaller pseudocysts. Stable gastric thickening also noted.  Disposition: Status is: Inpatient because: Requires IV fluids, tube feeds. Planned Discharge Destination: Home  Time spent: 29mn  SBonnell Public MD 12/17/2021 2:53 PM Page by [name]

## 2021-12-17 NOTE — Telephone Encounter (Signed)
-----   Message from Sharyn Creamer, MD sent at 12/16/2021  5:16 PM EDT ----- [facility], please arrange for GI follow up with Dr. Havery Moros or APP in 2-3 weeks. Thanks.

## 2021-12-18 ENCOUNTER — Inpatient Hospital Stay (HOSPITAL_COMMUNITY): Payer: Medicaid Other

## 2021-12-18 DIAGNOSIS — I480 Paroxysmal atrial fibrillation: Secondary | ICD-10-CM | POA: Diagnosis not present

## 2021-12-18 LAB — CBC
HCT: 30 % — ABNORMAL LOW (ref 39.0–52.0)
Hemoglobin: 9.3 g/dL — ABNORMAL LOW (ref 13.0–17.0)
MCH: 25.3 pg — ABNORMAL LOW (ref 26.0–34.0)
MCHC: 31 g/dL (ref 30.0–36.0)
MCV: 81.7 fL (ref 80.0–100.0)
Platelets: 157 10*3/uL (ref 150–400)
RBC: 3.67 MIL/uL — ABNORMAL LOW (ref 4.22–5.81)
RDW: 21.4 % — ABNORMAL HIGH (ref 11.5–15.5)
WBC: 7.1 10*3/uL (ref 4.0–10.5)
nRBC: 0 % (ref 0.0–0.2)

## 2021-12-18 LAB — COMPREHENSIVE METABOLIC PANEL
ALT: 12 U/L (ref 0–44)
AST: 18 U/L (ref 15–41)
Albumin: 3.2 g/dL — ABNORMAL LOW (ref 3.5–5.0)
Alkaline Phosphatase: 65 U/L (ref 38–126)
Anion gap: 7 (ref 5–15)
BUN: 7 mg/dL (ref 6–20)
CO2: 26 mmol/L (ref 22–32)
Calcium: 9 mg/dL (ref 8.9–10.3)
Chloride: 102 mmol/L (ref 98–111)
Creatinine, Ser: 0.71 mg/dL (ref 0.61–1.24)
GFR, Estimated: >60 mL/min (ref >60)
Glucose, Bld: 109 mg/dL — ABNORMAL HIGH (ref 70–99)
Potassium: 4.4 mmol/L (ref 3.5–5.1)
Sodium: 135 mmol/L (ref 135–145)
Total Bilirubin: 0.3 mg/dL (ref 0.3–1.2)
Total Protein: 6.5 g/dL (ref 6.5–8.1)

## 2021-12-18 LAB — VITAMIN B12: Vitamin B-12: 527 pg/mL (ref 180–914)

## 2021-12-18 LAB — IRON AND TIBC
Iron: 75 ug/dL (ref 45–182)
Saturation Ratios: 19 % (ref 17.9–39.5)
TIBC: 391 ug/dL (ref 250–450)
UIBC: 316 ug/dL

## 2021-12-18 LAB — GLUCOSE, CAPILLARY
Glucose-Capillary: 121 mg/dL — ABNORMAL HIGH (ref 70–99)
Glucose-Capillary: 120 mg/dL — ABNORMAL HIGH (ref 70–99)
Glucose-Capillary: 131 mg/dL — ABNORMAL HIGH (ref 70–99)
Glucose-Capillary: 111 mg/dL — ABNORMAL HIGH (ref 70–99)

## 2021-12-18 LAB — FOLATE: Folate: 10.7 ng/mL (ref >5.9)

## 2021-12-18 LAB — RETICULOCYTES
Immature Retic Fract: 20.5 % — ABNORMAL HIGH (ref 2.3–15.9)
RBC.: 3.66 MIL/uL — ABNORMAL LOW (ref 4.22–5.81)
Retic Count, Absolute: 32.9 10*3/uL (ref 19.0–186.0)
Retic Ct Pct: 0.9 % (ref 0.4–3.1)

## 2021-12-18 LAB — LIPASE, BLOOD: Lipase: 23 U/L (ref 11–51)

## 2021-12-18 LAB — FERRITIN: Ferritin: 314 ng/mL (ref 24–336)

## 2021-12-18 LAB — MAGNESIUM: Magnesium: 1.2 mg/dL — ABNORMAL LOW (ref 1.7–2.4)

## 2021-12-18 MED ORDER — MAGNESIUM SULFATE 4 GM/100ML IV SOLN
4.0000 g | Freq: Once | INTRAVENOUS | Status: AC
  Administered 2021-12-18: 4 g via INTRAVENOUS
  Filled 2021-12-18: qty 100

## 2021-12-18 MED ORDER — ORAL CARE MOUTH RINSE: 15.0000 mL | OROMUCOSAL | Status: DC | PRN

## 2021-12-18 MED ORDER — ORAL CARE MOUTH RINSE
15.0000 mL | OROMUCOSAL | Status: DC
  Administered 2021-12-20 – 2021-12-24 (×8): 15 mL via OROMUCOSAL

## 2021-12-18 MED ORDER — SODIUM CHLORIDE 0.9 % IV SOLN: INTRAVENOUS | Status: DC

## 2021-12-18 NOTE — Evaluation (Addendum)
Physical Therapy Evaluation Patient Details Name: [name] MRN: [id number] DOB: [birth year] Today's Date: 12/18/2021  History of Present Illness  51 year old [name] admitted 7/30  with abdominal pain diagnosed with acute on chronic recurrent alcoholic pancreatitis. PMH:  WPW s/p ablation, substance use, ongoing alcohol abuse, alcoholic pancreatitis, HTN, chronic pain (followed up by the pain physician and was recently on Suboxone) and seizure disorder.  Clinical Impression  Pt admitted with above diagnosis. Pt ambulates well overall.  Pt does need to practice steps as he has a lot of steps to get into home therefore will follow acutely.   Pt would benefit from use of cane on steps for safety. Pt would also benefit from use of scooter for community mobility as pt will require it to get to store in his neighborhood.  He states he does have a garage to store it in.  Messaged SW, CM and MD regarding scooter and cane.   Pt currently with functional limitations due to the deficits listed below (see PT Problem List). Pt will benefit from skilled PT to increase their independence and safety with mobility to allow discharge to the venue listed below.          Recommendations for follow up therapy are one component of a multi-disciplinary discharge planning process, led by the attending physician.  Recommendations may be updated based on patient status, additional functional criteria and insurance authorization.  Follow Up Recommendations No PT follow up      Assistance Recommended at Discharge PRN  Patient can return home with the following  Help with stairs or ramp for entrance    Equipment Recommendations Cane;Other (comment) Surveyor, minerals)  Recommendations for Other Services       Functional Status Assessment Patient has had a recent decline in their functional status and demonstrates the ability to make significant improvements in function in a reasonable and predictable  amount of time.     Precautions / Restrictions Precautions Precautions: Fall Restrictions Weight Bearing Restrictions: No      Mobility  Bed Mobility Overal bed mobility: Independent                  Transfers Overall transfer level: Independent                      Ambulation/Gait Ambulation/Gait assistance: Min guard Gait Distance (Feet): 400 Feet Assistive device: None     Gait velocity interpretation: 1.31 - 2.62 ft/sec, indicative of limited community ambulator   General Gait Details: No LOB with min challenges  Stairs Stairs: Yes Stairs assistance: Min guard, Min assist Stair Management: One rail Left, Alternating pattern, Forwards Number of Stairs: 4 General stair comments: Pt slightly dizzy and limited by lines therefore didnt complete more than 4 steps.  Needed min gaurd assist and min cues for safetly.  Wheelchair Mobility    Modified Rankin (Stroke Patients Only)       Balance Overall balance assessment: Needs assistance Sitting-balance support: No upper extremity supported, Feet supported Sitting balance-Leahy Scale: Good     Standing balance support: No upper extremity supported, During functional activity Standing balance-Leahy Scale: Good                               Pertinent Vitals/Pain Pain Assessment Pain Assessment: No/denies pain Breathing: normal Negative Vocalization: none Facial Expression: smiling or inexpressive Body Language: relaxed Consolability: no need to console PAINAD Score: 0  Home Living Family/patient expects to be discharged to:: Private residence Living Arrangements: Parent (mom) Available Help at Discharge: Available PRN/intermittently Type of Home: House Home Access: Stairs to enter Entrance Stairs-Rails: Left Entrance Stairs-Number of Steps: 12 Alternate Level Stairs-Number of Steps: 8 Home Layout: Multi-level Home Equipment: None      Prior Function Prior Level of  Function : Independent/Modified Independent             Mobility Comments: cant walk long distances per pt ADLs Comments: B/D self     Hand Dominance   Dominant Hand: Right    Extremity/Trunk Assessment   Upper Extremity Assessment Upper Extremity Assessment: Defer to OT evaluation    Lower Extremity Assessment Lower Extremity Assessment: Generalized weakness    Cervical / Trunk Assessment Cervical / Trunk Assessment: Normal  Communication   Communication: No difficulties  Cognition Arousal/Alertness: Awake/alert Behavior During Therapy: WFL for tasks assessed/performed Overall Cognitive Status: Within Functional Limits for tasks assessed                                          General Comments General comments (skin integrity, edema, etc.): VSS    Exercises     Assessment/Plan    PT Assessment Patient needs continued PT services  PT Problem List Decreased activity tolerance;Decreased balance;Decreased mobility;Decreased knowledge of use of DME;Decreased safety awareness;Decreased knowledge of precautions;Cardiopulmonary status limiting activity       PT Treatment Interventions DME instruction;Gait training;Stair training;Functional mobility training;Therapeutic activities;Therapeutic exercise;Balance training;Patient/family education    PT Goals (Current goals can be found in the Care Plan section)  Acute Rehab PT Goals Patient Stated Goal: to get scooter to go to grocery store PT Goal Formulation: With patient Time For Goal Achievement: 01/01/22 Potential to Achieve Goals: Good    Frequency Min 2X/week     Co-evaluation               AM-PAC PT "6 Clicks" Mobility  Outcome Measure Help needed turning from your back to your side while in a flat bed without using bedrails?: None Help needed moving from lying on your back to sitting on the side of a flat bed without using bedrails?: None Help needed moving to and from a bed to a  chair (including a wheelchair)?: None Help needed standing up from a chair using your arms (e.g., wheelchair or bedside chair)?: None Help needed to walk in hospital room?: A Little Help needed climbing 3-5 steps with a railing? : A Little 6 Click Score: 22    End of Session Equipment Utilized During Treatment: Gait belt Activity Tolerance: Patient tolerated treatment well;Patient limited by fatigue Patient left: in bed;with call bell/phone within reach;with bed alarm set;with family/visitor present Nurse Communication: Mobility status PT Visit Diagnosis: Muscle weakness (generalized) (M62.81)    Time: 7989-2119 PT Time Calculation (min) (ACUTE ONLY): 16 min   Charges:   PT Evaluation $PT Eval Moderate Complexity: 1 Mod          Lutisha Knoche M,PT Acute Rehab Services [phone]   Alvira Philips 12/18/2021, 4:01 PM

## 2021-12-18 NOTE — Progress Notes (Addendum)
PROGRESS NOTE                                                                                                                                                                                                             Patient Demographics:    [name], is a 51 y.o. male, DOB - [birth year], INO:[id number]  Outpatient Primary MD for the patient is Pcp, No    LOS - 23  Admit date - 12/05/2021    Chief Complaint  Patient presents with   Multiple Complaints        Brief Narrative (HPI from H&P)   51 year old [name] with past medical history significant for WPW s/p ablation, substance use, ongoing alcohol abuse, alcoholic pancreatitis, HTN, chronic pain (followed up by the pain physician and was recently on Suboxone) and seizure disorder.  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   Subjective:    [name] today has, No headache, No chest pain, +ve epigastric abdominal pain - No Nausea, No new weakness tingling or numbness, no SOB   Assessment  & Plan :    Acute on chronic alcoholic pancreatitis in a patient with ongoing alcohol abuse - he been seen by GI, is being treated with conservative measures, currently has a core track tube with tube feeds, IV fluids and pain control.  Overall gradual improvement, continue to monitor.  Counseled strictly to quit alcohol.  No signs of DTs.  He is more than 2 weeks out of his last alcohol abuse due to being in the hospital for 2 weeks.  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  S/p ablation per patient.  Does have episodes of RVR with aberrant conduction, has been on high-dose beta-blocker, seen by cardiology this admission, weight stable.  Continue to monitor.  His [place] vas 2 score is around 1.  No  anticoagulation per cardiology.  Pulmonary nodule - 3 mm left upper lobe nodule incidental finding on CTA-recommend 1 year follow-up  Alcohol use disorder, severe, dependence ([place]) - Reports normally checking 6-12 beers daily.  Last had reportedly 1 beer  About 2 days ago.Placed on CIWA protocol  Essential hypertension - Controlled. On Blocker and Norvasc.  Monitor.  Hypomagnesemia.  Replaced.       Condition - Extremely Guarded  Family Communication  :  None present  Code Status :  Full  Consults  :  GI  PUD Prophylaxis : PPI   Procedures  :            Disposition Plan  :    Status is: Inpatient  DVT Prophylaxis  :    enoxaparin (LOVENOX) injection 40 mg Start: 12/06/21 2200   Lab Results  Component Value Date   PLT 157 12/18/2021    Diet :  Diet Order             Diet NPO time specified Except for: Sips with Meds  Diet effective now                    Inpatient Medications  Scheduled Meds:  amLODipine  10 mg Oral Daily   clotrimazole   Topical BID   DULoxetine  30 mg Oral Daily   enoxaparin (LOVENOX) injection  40 mg Subcutaneous Q24H   feeding supplement  1 Container Oral TID WC   feeding supplement (PROSource TF20)  60 mL Per Tube Daily   folic acid  1 mg Oral Daily   free water  30 mL Per Tube Q4H   lipase/protease/amylase  72,000 Units Oral TID   metoprolol succinate  100 mg Oral Daily   multivitamin with minerals  1 tablet Oral Daily   nicotine  21 mg Transdermal QHS   pantoprazole  40 mg Oral BID   sodium chloride flush  10-40 mL Intracatheter Q12H   sucralfate  1 g Oral TID WC & HS   thiamine  100 mg Oral Daily   Continuous Infusions:  sodium chloride     feeding supplement (VITAL 1.5 CAL) 45 mL/hr at 12/18/21 3734   magnesium sulfate bolus IVPB     PRN Meds:.acetaminophen, diphenhydrAMINE, hydrALAZINE, HYDROmorphone (DILAUDID) injection, lidocaine, loperamide, LORazepam, ondansetron (ZOFRAN) IV, simethicone, sodium chloride  flush  Antibiotics  :    Anti-infectives (From admission, onward)    None        Time Spent in minutes  30   Lala Lund M.D on 12/18/2021 at 12:26 PM  To page go to [email or web address]   [facility] -  Office  [phone]  See all Orders from today for further details    Objective:   Vitals:   12/17/21 2335 12/18/21 0306 12/18/21 0713 12/18/21 1035  BP: 116/69 112/69 113/63 106/66  Pulse: 70 67 69 64  Resp:   17 17  Temp: 98 F (36.7 C) 98.2 F (36.8 C) 98 F (36.7 C) 98.1 F (36.7 C)  TempSrc: Oral Oral Oral Oral  SpO2: 95% 98% 99% 97%  Weight:      Height:        Wt Readings from Last 3 Encounters:  12/17/21 55.5 kg  11/29/21 61 kg  11/02/21 61.7 kg     Intake/Output Summary (Last 24 hours) at 12/18/2021 1226 Last data filed at 12/18/2021 1036 Gross per 24 hour  Intake --  Output 1950 ml  Net -1950 ml     Physical Exam  Awake Alert, No new F.N deficits, core track NG tube in place, left arm midline in place [place].AT,PERRAL Supple Neck, No JVD,   Symmetrical Chest wall movement, Good air movement bilaterally, CTAB RRR,No Gallops, Rubs or new Murmurs,  +ve B.Sounds, Abd Soft, No tenderness,   No Cyanosis, Clubbing or edema     Data  Review:    CBC Recent Labs  Lab 12/14/21 0132 12/14/21 1202 12/15/21 0102 12/16/21 0117 12/18/21 0744  WBC 6.7  --  7.2 6.9 7.1  HGB 7.9* 9.3* 8.8* 9.1* 9.3*  HCT 25.4* 29.0* 28.0* 28.4* 30.0*  PLT 192  --  183 173 157  MCV 81.9  --  81.4 80.2 81.7  MCH 25.5*  --  25.6* 25.7* 25.3*  MCHC 31.1  --  31.4 32.0 31.0  RDW 20.8*  --  20.8* 20.8* 21.4*    Electrolytes Recent Labs  Lab 12/13/21 0103 12/14/21 0132 12/15/21 0102 12/16/21 0117 12/18/21 0744  NA 140 140 138 134* 135  K 4.8 4.3 4.8 4.6 4.4  CL 109 108 106 106 102  CO2 '22 24 24 23 26  '$ GLUCOSE 66* 141* 82 97 109*  BUN <5* <5* <5* 5* 7  CREATININE 0.55* 0.65 0.70 0.83 0.71  CALCIUM 7.6* 8.1* 8.4* 8.6* 9.0  AST  --  '22 23 20 18  '$ ALT   --  '16 16 14 12  '$ ALKPHOS  --  60 66 66 65  BILITOT  --  0.5 0.4 0.4 0.3  ALBUMIN  --  2.6* 2.9* 2.9* 3.2*  MG  --  1.0* 1.6* 1.7 1.2*    ------------------------------------------------------------------------------------------------------------------ No results for input(s): "CHOL", "HDL", "LDLCALC", "TRIG", "CHOLHDL", "LDLDIRECT" in the last 72 hours.  Lab Results  Component Value Date   HGBA1C 4.6 (L) 11/13/2018    No results for input(s): "TSH", "T4TOTAL", "T3FREE", "THYROIDAB" in the last 72 hours.  Invalid input(s): "FREET3" ------------------------------------------------------------------------------------------------------------------ ID Labs Recent Labs  Lab 12/13/21 0103 12/14/21 0132 12/15/21 0102 12/16/21 0117 12/18/21 0744  WBC  --  6.7 7.2 6.9 7.1  PLT  --  192 183 173 157  CREATININE 0.55* 0.65 0.70 0.83 0.71   Cardiac Enzymes No results for input(s): "CKMB", "TROPONINI", "MYOGLOBIN" in the last 168 hours.  Invalid input(s): "CK"     Micro Results No results found for this or any previous visit (from the past 240 hour(s)).  Radiology Reports No results found.

## 2021-12-18 NOTE — TOC Progression Note (Addendum)
Transition of Care [facility]) - Progression Note    Patient Details  Name: [name] MRN: [id number] Date of Birth: 04-27-1971  [facility] CM/SW Contact  Konrad Penta, RN Phone Number: 12/18/2021, 4:18 PM  Clinical Narrative:   Kasandra Knudsen ordered for home use. Spoke to [place] with [employer] to request. To be delivered to patient's room.    Expected Discharge Plan: Home/Self Care Barriers to Discharge: Continued Medical Work up  Expected Discharge Plan and Services Expected Discharge Plan: Home/Self Care In-house Referral: NA Discharge Planning Services: CM Consult Post Acute Care Choice: NA Living arrangements for the past 2 months: Single Family Home                 DME Arranged: Kasandra Knudsen DME Agency: [employer] Date DME Agency Contacted: 12/18/21 Time DME Agency Contacted: [phone] Representative spoke with at DME Agency: [place]: NA           Social Determinants of Health ([place]) Interventions    Readmission Risk Interventions    12/16/2021    4:07 PM 08/18/2021   10:38 AM 06/08/2021   11:22 AM  Readmission Risk Prevention Plan  Transportation Screening Complete Complete Complete  Medication Review (RN Care Manager) Referral to Pharmacy Complete Referral to Pharmacy  PCP or Specialist appointment within 3-5 days of discharge Complete Complete Not Complete  PCP/Specialist Appt Not Complete comments   patient not ready to d/c  HRI or [place] Complete Complete Complete  SW Recovery Care/Counseling Consult Complete Complete Complete  Palliative Care Screening Not Applicable Not Applicable Not [place] Not Applicable Not Applicable Not Applicable

## 2021-12-19 DIAGNOSIS — I480 Paroxysmal atrial fibrillation: Secondary | ICD-10-CM | POA: Diagnosis not present

## 2021-12-19 LAB — CBC WITH DIFFERENTIAL/PLATELET
Abs Immature Granulocytes: 0.1 10*3/uL — ABNORMAL HIGH (ref 0.00–0.07)
Basophils Absolute: 0.1 10*3/uL (ref 0.0–0.1)
Basophils Relative: 1 %
Eosinophils Absolute: 0.3 10*3/uL (ref 0.0–0.5)
Eosinophils Relative: 5 %
HCT: 27 % — ABNORMAL LOW (ref 39.0–52.0)
Hemoglobin: 8.7 g/dL — ABNORMAL LOW (ref 13.0–17.0)
Lymphocytes Relative: 41 %
Lymphs Abs: 2.8 10*3/uL (ref 0.7–4.0)
MCH: 25.7 pg — ABNORMAL LOW (ref 26.0–34.0)
MCHC: 32.2 g/dL (ref 30.0–36.0)
MCV: 79.9 fL — ABNORMAL LOW (ref 80.0–100.0)
Metamyelocytes Relative: 1 %
Monocytes Absolute: 0.3 10*3/uL (ref 0.1–1.0)
Monocytes Relative: 4 %
Neutro Abs: 3.3 10*3/uL (ref 1.7–7.7)
Neutrophils Relative %: 48 %
Platelets: 158 10*3/uL (ref 150–400)
RBC: 3.38 MIL/uL — ABNORMAL LOW (ref 4.22–5.81)
RDW: 21.4 % — ABNORMAL HIGH (ref 11.5–15.5)
WBC: 6.8 10*3/uL (ref 4.0–10.5)
nRBC: 0 % (ref 0.0–0.2)
nRBC: 0 /100 WBC

## 2021-12-19 LAB — COMPREHENSIVE METABOLIC PANEL
ALT: 11 U/L (ref 0–44)
AST: 15 U/L (ref 15–41)
Albumin: 2.9 g/dL — ABNORMAL LOW (ref 3.5–5.0)
Alkaline Phosphatase: 60 U/L (ref 38–126)
Anion gap: 7 (ref 5–15)
BUN: 8 mg/dL (ref 6–20)
CO2: 26 mmol/L (ref 22–32)
Calcium: 8.7 mg/dL — ABNORMAL LOW (ref 8.9–10.3)
Chloride: 102 mmol/L (ref 98–111)
Creatinine, Ser: 0.72 mg/dL (ref 0.61–1.24)
GFR, Estimated: >60 mL/min (ref >60)
Glucose, Bld: 136 mg/dL — ABNORMAL HIGH (ref 70–99)
Potassium: 4.1 mmol/L (ref 3.5–5.1)
Sodium: 135 mmol/L (ref 135–145)
Total Bilirubin: 0.3 mg/dL (ref 0.3–1.2)
Total Protein: 6.1 g/dL — ABNORMAL LOW (ref 6.5–8.1)

## 2021-12-19 LAB — GLUCOSE, CAPILLARY
Glucose-Capillary: 103 mg/dL — ABNORMAL HIGH (ref 70–99)
Glucose-Capillary: 122 mg/dL — ABNORMAL HIGH (ref 70–99)
Glucose-Capillary: 128 mg/dL — ABNORMAL HIGH (ref 70–99)
Glucose-Capillary: 131 mg/dL — ABNORMAL HIGH (ref 70–99)
Glucose-Capillary: 140 mg/dL — ABNORMAL HIGH (ref 70–99)
Glucose-Capillary: 146 mg/dL — ABNORMAL HIGH (ref 70–99)
Glucose-Capillary: 157 mg/dL — ABNORMAL HIGH (ref 70–99)

## 2021-12-19 LAB — LIPASE, BLOOD: Lipase: 23 U/L (ref 11–51)

## 2021-12-19 LAB — MAGNESIUM: Magnesium: 2 mg/dL (ref 1.7–2.4)

## 2021-12-19 NOTE — Progress Notes (Signed)
PROGRESS NOTE                                                                                                                                                                                                             Patient Demographics:    [name], is a 51 y.o. male, DOB - 21-Apr-1971, WGN:[id number]  Outpatient Primary MD for the patient is [name]    LOS - 12  Admit date - 12/05/2021    Chief Complaint  Patient presents with   Multiple Complaints        Brief Narrative (HPI from H&P)   51 year old [name] with past medical history significant for WPW s/p ablation, substance use, ongoing alcohol abuse, alcoholic pancreatitis, HTN, chronic pain (followed up by the pain physician and was recently on Suboxone) and seizure disorder.  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   Subjective:   Patient in bed, appears comfortable, denies any headache, Moran fever, Moran chest pain or pressure, Moran shortness of breath , Moran nausea, improving abdominal pain. Moran new focal weakness.    Assessment  & Plan :    Acute on chronic alcoholic pancreatitis in a patient with ongoing alcohol abuse - he been seen by GI, is being treated with conservative measures, currently has a core track tube with tube feeds, IV fluids and pain control.  Overall gradual improvement, continue to monitor.  Counseled strictly to quit alcohol.  Moran signs of DTs.  He is more than 2 weeks out of his last alcohol abuse due to being in the hospital for 2 weeks.  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  S/p ablation per patient.  Does have episodes of RVR with aberrant conduction, has been on high-dose beta-blocker, seen by cardiology this admission, weight stable.  Continue to monitor.  His  [place] vas 2 score is around 1.  Moran anticoagulation per cardiology.  Pulmonary nodule - 3 mm left upper lobe nodule incidental finding on CTA-recommend 1 year follow-up  Alcohol use disorder, severe, dependence ([place]) - Reports normally checking 6-12 beers daily.  Last had reportedly 1 beer  About 2 days ago.Placed on CIWA protocol  Essential hypertension - Controlled. On Blocker and Norvasc.  Monitor.  Hypomagnesemia.  Replaced.       Condition - Extremely Guarded  Family Communication  :  None present  Code Status :  Full  Consults  :  GI  PUD Prophylaxis : PPI   Procedures  :            Disposition Plan  :    Status is: Inpatient  DVT Prophylaxis  :    enoxaparin (LOVENOX) injection 40 mg Start: 12/06/21 2200   Lab Results  Component Value Date   PLT 158 12/19/2021    Diet :  Diet Order             Diet NPO time specified Except for: Sips with Meds  Diet effective now                    Inpatient Medications  Scheduled Meds:  amLODipine  10 mg Oral Daily   clotrimazole   Topical BID   DULoxetine  30 mg Oral Daily   enoxaparin (LOVENOX) injection  40 mg Subcutaneous Q24H   feeding supplement  1 Container Oral TID WC   feeding supplement (PROSource TF20)  60 mL Per Tube Daily   folic acid  1 mg Oral Daily   free water  30 mL Per Tube Q4H   lipase/protease/amylase  72,000 Units Oral TID   metoprolol succinate  100 mg Oral Daily   multivitamin with minerals  1 tablet Oral Daily   nicotine  21 mg Transdermal QHS   mouth rinse  15 mL Mouth Rinse 4 times per day   pantoprazole  40 mg Oral BID   sucralfate  1 g Oral TID WC & HS   thiamine  100 mg Oral Daily   Continuous Infusions:  sodium chloride 75 mL/hr at 12/19/21 0300   feeding supplement (VITAL 1.5 CAL) 1,000 mL (12/19/21 0332)   PRN Meds:.acetaminophen, diphenhydrAMINE, hydrALAZINE, HYDROmorphone (DILAUDID) injection, lidocaine, loperamide, LORazepam, ondansetron (ZOFRAN) IV, mouth rinse,  simethicone  Antibiotics  :    Anti-infectives (From admission, onward)    None        Time Spent in minutes  30   [name] M.D on 12/19/2021 at 11:42 AM  To page go to [email or web address]   [facility] -  Office  [phone]  See all Orders from today for further details    Objective:   Vitals:   12/19/21 0028 12/19/21 0333 12/19/21 0743 12/19/21 1049  BP: 103/61 105/61 (!) 100/51 (!) 101/57  Pulse: 70 84 68 71  Resp: '18 18  17  '$ Temp: 97.6 F (36.4 C) 98.1 F (36.7 C) 98.2 F (36.8 C) 98.1 F (36.7 C)  TempSrc: Oral Oral Oral Oral  SpO2: 97% 98% 99% 98%  Weight:  54.4 kg    Height:        Wt Readings from Last 3 Encounters:  12/19/21 54.4 kg  11/29/21 61 kg  11/02/21 61.7 kg     Intake/Output Summary (Last 24 hours) at 12/19/2021 1142 Last data filed at 12/19/2021 1052 Gross per 24 hour  Intake 3730.7 ml  Output 2000 ml  Net 1730.7 ml     Physical Exam  Awake Alert, Moran new F.N deficits, core track NG tube in place, left arm midline in place [place].AT,PERRAL Supple Neck, Moran JVD,   Symmetrical Chest wall movement, Good air movement bilaterally, CTAB RRR,Moran Gallops, Rubs or new Murmurs,  +ve B.Sounds, Abd Soft, Moran tenderness,   Moran Cyanosis, Clubbing or edema  Data Review:    CBC Recent Labs  Lab 12/14/21 0132 12/14/21 1202 12/15/21 0102 12/16/21 0117 12/18/21 0744 12/19/21 0021  WBC 6.7  --  7.2 6.9 7.1 6.8  HGB 7.9* 9.3* 8.8* 9.1* 9.3* 8.7*  HCT 25.4* 29.0* 28.0* 28.4* 30.0* 27.0*  PLT 192  --  183 173 157 158  MCV 81.9  --  81.4 80.2 81.7 79.9*  MCH 25.5*  --  25.6* 25.7* 25.3* 25.7*  MCHC 31.1  --  31.4 32.0 31.0 32.2  RDW 20.8*  --  20.8* 20.8* 21.4* 21.4*  LYMPHSABS  --   --   --   --   --  2.8  MONOABS  --   --   --   --   --  0.3  EOSABS  --   --   --   --   --  0.3  BASOSABS  --   --   --   --   --  0.1    Electrolytes Recent Labs  Lab 12/14/21 0132 12/15/21 0102 12/16/21 0117 12/18/21 0744 12/19/21 0021   NA 140 138 134* 135 135  K 4.3 4.8 4.6 4.4 4.1  CL 108 106 106 102 102  CO2 '24 24 23 26 26  '$ GLUCOSE 141* 82 97 109* 136*  BUN <5* <5* 5* 7 8  CREATININE 0.65 0.70 0.83 0.71 0.72  CALCIUM 8.1* 8.4* 8.6* 9.0 8.7*  AST '22 23 20 18 15  '$ ALT '16 16 14 12 11  '$ ALKPHOS 60 66 66 65 60  BILITOT 0.5 0.4 0.4 0.3 0.3  ALBUMIN 2.6* 2.9* 2.9* 3.2* 2.9*  MG 1.0* 1.6* 1.7 1.2* 2.0    ------------------------------------------------------------------------------------------------------------------ Moran results for input(s): "CHOL", "HDL", "LDLCALC", "TRIG", "CHOLHDL", "LDLDIRECT" in the last 72 hours.  Lab Results  Component Value Date   HGBA1C 4.6 (L) 11/13/2018    Moran results for input(s): "TSH", "T4TOTAL", "T3FREE", "THYROIDAB" in the last 72 hours.  Invalid input(s): "FREET3" ------------------------------------------------------------------------------------------------------------------ ID Labs Recent Labs  Lab 12/14/21 0132 12/15/21 0102 12/16/21 0117 12/18/21 0744 12/19/21 0021  WBC 6.7 7.2 6.9 7.1 6.8  PLT 192 183 173 157 158  CREATININE 0.65 0.70 0.83 0.71 0.72   Cardiac Enzymes Moran results for input(s): "CKMB", "TROPONINI", "MYOGLOBIN" in the last 168 hours.  Invalid input(s): "CK"     Micro Results Moran results found for this or any previous visit (from the past 240 hour(s)).  Radiology Reports DG Abd Portable 1V  Result Date: 12/18/2021 CLINICAL DATA:  Abdominal pain EXAM: PORTABLE ABDOMEN - 1 VIEW COMPARISON:  12/13/2021 FINDINGS: Tip of enteric tube is seen in the region of fourth portion of duodenum. Bowel gas pattern is nonspecific. Moran abnormal masses or calcifications are seen. Arterial calcifications are seen. IMPRESSION: Nonspecific bowel gas pattern. Tip of enteric tube is seen in the region of fourth portion of duodenum. Electronically Signed   By: Elmer Picker M.D.   On: 12/18/2021 13:07

## 2021-12-20 DIAGNOSIS — I480 Paroxysmal atrial fibrillation: Secondary | ICD-10-CM | POA: Diagnosis not present

## 2021-12-20 LAB — GLUCOSE, CAPILLARY
Glucose-Capillary: 144 mg/dL — ABNORMAL HIGH (ref 70–99)
Glucose-Capillary: 108 mg/dL — ABNORMAL HIGH (ref 70–99)
Glucose-Capillary: 89 mg/dL (ref 70–99)
Glucose-Capillary: 126 mg/dL — ABNORMAL HIGH (ref 70–99)

## 2021-12-20 LAB — CBC WITH DIFFERENTIAL/PLATELET
Abs Immature Granulocytes: 0.02 10*3/uL (ref 0.00–0.07)
Basophils Absolute: 0 10*3/uL (ref 0.0–0.1)
Basophils Relative: 0 %
Eosinophils Absolute: 0.4 10*3/uL (ref 0.0–0.5)
Eosinophils Relative: 5 %
HCT: 27.9 % — ABNORMAL LOW (ref 39.0–52.0)
Hemoglobin: 8.9 g/dL — ABNORMAL LOW (ref 13.0–17.0)
Immature Granulocytes: 0 %
Lymphocytes Relative: 29 %
Lymphs Abs: 2.3 10*3/uL (ref 0.7–4.0)
MCH: 25.6 pg — ABNORMAL LOW (ref 26.0–34.0)
MCHC: 31.9 g/dL (ref 30.0–36.0)
MCV: 80.4 fL (ref 80.0–100.0)
Monocytes Absolute: 0.9 10*3/uL (ref 0.1–1.0)
Monocytes Relative: 12 %
Neutro Abs: 4.3 10*3/uL (ref 1.7–7.7)
Neutrophils Relative %: 54 %
Platelets: 163 10*3/uL (ref 150–400)
RBC: 3.47 MIL/uL — ABNORMAL LOW (ref 4.22–5.81)
RDW: 22 % — ABNORMAL HIGH (ref 11.5–15.5)
WBC: 7.9 10*3/uL (ref 4.0–10.5)
nRBC: 0 % (ref 0.0–0.2)

## 2021-12-20 LAB — COMPREHENSIVE METABOLIC PANEL
ALT: 10 U/L (ref 0–44)
AST: 17 U/L (ref 15–41)
Albumin: 3 g/dL — ABNORMAL LOW (ref 3.5–5.0)
Alkaline Phosphatase: 56 U/L (ref 38–126)
Anion gap: 7 (ref 5–15)
BUN: 11 mg/dL (ref 6–20)
CO2: 25 mmol/L (ref 22–32)
Calcium: 9 mg/dL (ref 8.9–10.3)
Chloride: 103 mmol/L (ref 98–111)
Creatinine, Ser: 0.74 mg/dL (ref 0.61–1.24)
GFR, Estimated: >60 mL/min (ref >60)
Glucose, Bld: 112 mg/dL — ABNORMAL HIGH (ref 70–99)
Potassium: 4.2 mmol/L (ref 3.5–5.1)
Sodium: 135 mmol/L (ref 135–145)
Total Bilirubin: 0.2 mg/dL — ABNORMAL LOW (ref 0.3–1.2)
Total Protein: 6.2 g/dL — ABNORMAL LOW (ref 6.5–8.1)

## 2021-12-20 LAB — LIPASE, BLOOD: Lipase: 26 U/L (ref 11–51)

## 2021-12-20 LAB — MAGNESIUM: Magnesium: 1.5 mg/dL — ABNORMAL LOW (ref 1.7–2.4)

## 2021-12-20 MED ORDER — HYDROMORPHONE HCL 1 MG/ML IJ SOLN
1.0000 mg | INTRAMUSCULAR | Status: DC | PRN
  Administered 2021-12-20: 1 mg via INTRAVENOUS
  Filled 2021-12-20: qty 1

## 2021-12-20 MED ORDER — HYDROCODONE-ACETAMINOPHEN 5-325 MG PO TABS
1.0000 | ORAL_TABLET | ORAL | Status: DC | PRN
  Administered 2021-12-20 – 2021-12-24 (×9): 1 via ORAL
  Filled 2021-12-20 (×10): qty 1

## 2021-12-20 MED ORDER — MAGNESIUM SULFATE 2 GM/50ML IV SOLN
2.0000 g | Freq: Once | INTRAVENOUS | Status: AC
  Administered 2021-12-20: 2 g via INTRAVENOUS
  Filled 2021-12-20: qty 50

## 2021-12-20 MED ORDER — NALOXONE HCL 0.4 MG/ML IJ SOLN: 0.4000 mg | INTRAMUSCULAR | Status: DC | PRN

## 2021-12-20 MED ORDER — HYDROMORPHONE HCL 1 MG/ML IJ SOLN
0.5000 mg | INTRAMUSCULAR | Status: AC | PRN
  Administered 2021-12-20 – 2021-12-21 (×2): 0.5 mg via INTRAVENOUS
  Filled 2021-12-20 (×2): qty 0.5

## 2021-12-20 NOTE — Plan of Care (Signed)

## 2021-12-20 NOTE — Progress Notes (Signed)
Pt c/o pain.  States he needs something stronger for pain.  Pt walking in room.  VSS.  Pt request RN call doctor.  Doctor paged.

## 2021-12-20 NOTE — Progress Notes (Signed)
PROGRESS NOTE                                                                                                                                                                                                             Patient Demographics:    [name], is a 51 y.o. male, DOB - 1970/09/25, ZYY:[id number]  Outpatient Primary MD for the patient is Pcp, No    LOS - 22  Admit date - 12/05/2021    Chief Complaint  Patient presents with   Multiple Complaints        Brief Narrative (HPI from H&P)   51 year old [name] with past medical history significant for WPW s/p ablation, substance use, ongoing alcohol abuse, alcoholic pancreatitis, HTN, chronic pain (followed up by the pain physician and was recently on Suboxone) and seizure disorder.  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   Subjective:   Patient in bed, appears comfortable, denies any headache, no fever, no chest pain or pressure, no shortness of breath , no abdominal pain. No new focal weakness.   Assessment  & Plan :    Acute on chronic alcoholic pancreatitis in a patient with ongoing alcohol abuse - he been seen by GI, is being treated with conservative measures. Counseled strictly to quit alcohol.  No signs of DTs.  He is more than 2 weeks out of his last alcohol abuse due to being in the hospital for 2 weeks.  With conservative management he is much better he had a core track tube through which she was getting tube feeds, clinically much better and appears to be in no discomfort, will discontinue tube feeds on 12/20/2021 and challenge with oral diet.  Also some narcotic seeking behavior suspected.  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  S/p ablation per patient.  Does have episodes of  RVR with aberrant conduction, has been on high-dose beta-blocker, seen by cardiology this admission, weight stable.  Continue to monitor.  His [place] vas 2 score is around 1.  No anticoagulation per cardiology.  Pulmonary nodule - 3 mm left upper lobe nodule incidental finding on CTA-recommend 1 year follow-up  Alcohol use disorder, severe, dependence ([place]) - Reports normally checking 6-12 beers  daily.  Last had reportedly 1 beer  About 2 days ago.Placed on CIWA protocol  Essential hypertension - Controlled. On Blocker and Norvasc.  Monitor.  Hypomagnesemia.  Replaced.       Condition - Extremely Guarded  Family Communication  :  None present  Code Status :  Full  Consults  :  GI  PUD Prophylaxis : PPI   Procedures  :            Disposition Plan  :    Status is: Inpatient  DVT Prophylaxis  :    enoxaparin (LOVENOX) injection 40 mg Start: 12/06/21 2200   Lab Results  Component Value Date   PLT 163 12/20/2021    Diet :  Diet Order             DIET SOFT Room service appropriate? Yes; Fluid consistency: Thin  Diet effective now                    Inpatient Medications  Scheduled Meds:  amLODipine  10 mg Oral Daily   clotrimazole   Topical BID   DULoxetine  30 mg Oral Daily   enoxaparin (LOVENOX) injection  40 mg Subcutaneous Q24H   feeding supplement  1 Container Oral TID WC   feeding supplement (PROSource TF20)  60 mL Per Tube Daily   folic acid  1 mg Oral Daily   lipase/protease/amylase  72,000 Units Oral TID   metoprolol succinate  100 mg Oral Daily   multivitamin with minerals  1 tablet Oral Daily   nicotine  21 mg Transdermal QHS   mouth rinse  15 mL Mouth Rinse 4 times per day   pantoprazole  40 mg Oral BID   sucralfate  1 g Oral TID WC & HS   thiamine  100 mg Oral Daily   Continuous Infusions:  magnesium sulfate bolus IVPB     PRN Meds:.acetaminophen, diphenhydrAMINE, hydrALAZINE, lidocaine, loperamide, LORazepam, ondansetron (ZOFRAN) IV,  mouth rinse, simethicone  Antibiotics  :    Anti-infectives (From admission, onward)    None        Time Spent in minutes  30   Lala Lund M.D on 12/20/2021 at 9:26 AM  To page go to [email or web address]   [facility] -  Office  [phone]  See all Orders from today for further details    Objective:   Vitals:   12/19/21 1049 12/19/21 1958 12/19/21 2332 12/20/21 0335  BP: (!) 101/57 105/68 103/60 126/74  Pulse: 71 74 84 68  Resp: '17 20 16 18  '$ Temp: 98.1 F (36.7 C) 98.3 F (36.8 C) 98.3 F (36.8 C) 97.6 F (36.4 C)  TempSrc: Oral Oral Oral Oral  SpO2: 98% 97% 94% 98%  Weight:    54.7 kg  Height:        Wt Readings from Last 3 Encounters:  12/20/21 54.7 kg  11/29/21 61 kg  11/02/21 61.7 kg     Intake/Output Summary (Last 24 hours) at 12/20/2021 0926 Last data filed at 12/20/2021 0645 Gross per 24 hour  Intake 3265.66 ml  Output 1600 ml  Net 1665.66 ml     Physical Exam  Awake Alert, No new F.N deficits, core track NG tube in place, left arm midline in place [place].AT,PERRAL Supple Neck, No JVD,   Symmetrical Chest wall movement, Good air movement bilaterally, CTAB RRR,No Gallops, Rubs or new Murmurs,  +ve B.Sounds, Abd Soft, No tenderness,   No Cyanosis, Clubbing or edema  Data Review:    CBC Recent Labs  Lab 12/15/21 0102 12/16/21 0117 12/18/21 0744 12/19/21 0021 12/20/21 0151  WBC 7.2 6.9 7.1 6.8 7.9  HGB 8.8* 9.1* 9.3* 8.7* 8.9*  HCT 28.0* 28.4* 30.0* 27.0* 27.9*  PLT 183 173 157 158 163  MCV 81.4 80.2 81.7 79.9* 80.4  MCH 25.6* 25.7* 25.3* 25.7* 25.6*  MCHC 31.4 32.0 31.0 32.2 31.9  RDW 20.8* 20.8* 21.4* 21.4* 22.0*  LYMPHSABS  --   --   --  2.8 2.3  MONOABS  --   --   --  0.3 0.9  EOSABS  --   --   --  0.3 0.4  BASOSABS  --   --   --  0.1 0.0    Electrolytes Recent Labs  Lab 12/15/21 0102 12/16/21 0117 12/18/21 0744 12/19/21 0021 12/20/21 0151  NA 138 134* 135 135 135  K 4.8 4.6 4.4 4.1 4.2  CL 106 106 102 102  103  CO2 '24 23 26 26 25  '$ GLUCOSE 82 97 109* 136* 112*  BUN <5* 5* '7 8 11  '$ CREATININE 0.70 0.83 0.71 0.72 0.74  CALCIUM 8.4* 8.6* 9.0 8.7* 9.0  AST '23 20 18 15 17  '$ ALT '16 14 12 11 10  '$ ALKPHOS 66 66 65 60 56  BILITOT 0.4 0.4 0.3 0.3 0.2*  ALBUMIN 2.9* 2.9* 3.2* 2.9* 3.0*  MG 1.6* 1.7 1.2* 2.0 1.5*    ------------------------------------------------------------------------------------------------------------------ No results for input(s): "CHOL", "HDL", "LDLCALC", "TRIG", "CHOLHDL", "LDLDIRECT" in the last 72 hours.  Lab Results  Component Value Date   HGBA1C 4.6 (L) 11/13/2018    No results for input(s): "TSH", "T4TOTAL", "T3FREE", "THYROIDAB" in the last 72 hours.  Invalid input(s): "FREET3" ------------------------------------------------------------------------------------------------------------------ ID Labs Recent Labs  Lab 12/15/21 0102 12/16/21 0117 12/18/21 0744 12/19/21 0021 12/20/21 0151  WBC 7.2 6.9 7.1 6.8 7.9  PLT 183 173 157 158 163  CREATININE 0.70 0.83 0.71 0.72 0.74   Cardiac Enzymes No results for input(s): "CKMB", "TROPONINI", "MYOGLOBIN" in the last 168 hours.  Invalid input(s): "CK"   Micro Results No results found for this or any previous visit (from the past 240 hour(s)).  Radiology Reports DG Abd Portable 1V  Result Date: 12/18/2021 CLINICAL DATA:  Abdominal pain EXAM: PORTABLE ABDOMEN - 1 VIEW COMPARISON:  12/13/2021 FINDINGS: Tip of enteric tube is seen in the region of fourth portion of duodenum. Bowel gas pattern is nonspecific. No abnormal masses or calcifications are seen. Arterial calcifications are seen. IMPRESSION: Nonspecific bowel gas pattern. Tip of enteric tube is seen in the region of fourth portion of duodenum. Electronically Signed   By: Elmer Picker M.D.   On: 12/18/2021 13:07

## 2021-12-20 NOTE — Progress Notes (Addendum)
Pt walked to nurses station to advise in 6/10 abd pain needed pain medication. Patient states ate a little bit of late breakfast but did not eat lunch. Patient advise PRN pain medication not due at this time. MD notified. RN was advise could give PRN early if patient in distress. Tube feed still to be stopped as of now per MD.

## 2021-12-20 NOTE — Progress Notes (Signed)
[facility] night cross cover note:  Mag level this AM: 1.5. I ordered 2 g of IV magnesium sulfate.    Babs Bertin, DO Hospitalist

## 2021-12-21 ENCOUNTER — Inpatient Hospital Stay (HOSPITAL_COMMUNITY): Payer: Medicaid Other

## 2021-12-21 DIAGNOSIS — R1084 Generalized abdominal pain: Secondary | ICD-10-CM

## 2021-12-21 DIAGNOSIS — K852 Alcohol induced acute pancreatitis without necrosis or infection: Secondary | ICD-10-CM | POA: Diagnosis not present

## 2021-12-21 DIAGNOSIS — I480 Paroxysmal atrial fibrillation: Secondary | ICD-10-CM | POA: Diagnosis not present

## 2021-12-21 LAB — GLUCOSE, CAPILLARY
Glucose-Capillary: 105 mg/dL — ABNORMAL HIGH (ref 70–99)
Glucose-Capillary: 123 mg/dL — ABNORMAL HIGH (ref 70–99)
Glucose-Capillary: 142 mg/dL — ABNORMAL HIGH (ref 70–99)
Glucose-Capillary: 196 mg/dL — ABNORMAL HIGH (ref 70–99)
Glucose-Capillary: 98 mg/dL (ref 70–99)

## 2021-12-21 LAB — CBC WITH DIFFERENTIAL/PLATELET
Abs Immature Granulocytes: 0.05 10*3/uL (ref 0.00–0.07)
Basophils Absolute: 0 10*3/uL (ref 0.0–0.1)
Basophils Relative: 0 %
Eosinophils Absolute: 0.3 10*3/uL (ref 0.0–0.5)
Eosinophils Relative: 4 %
HCT: 29.4 % — ABNORMAL LOW (ref 39.0–52.0)
Hemoglobin: 9.5 g/dL — ABNORMAL LOW (ref 13.0–17.0)
Immature Granulocytes: 1 %
Lymphocytes Relative: 21 %
Lymphs Abs: 2 10*3/uL (ref 0.7–4.0)
MCH: 26.1 pg (ref 26.0–34.0)
MCHC: 32.3 g/dL (ref 30.0–36.0)
MCV: 80.8 fL (ref 80.0–100.0)
Monocytes Absolute: 0.8 10*3/uL (ref 0.1–1.0)
Monocytes Relative: 9 %
Neutro Abs: 6.2 10*3/uL (ref 1.7–7.7)
Neutrophils Relative %: 65 %
Platelets: 219 10*3/uL (ref 150–400)
RBC: 3.64 MIL/uL — ABNORMAL LOW (ref 4.22–5.81)
RDW: 22.3 % — ABNORMAL HIGH (ref 11.5–15.5)
WBC: 9.4 10*3/uL (ref 4.0–10.5)
nRBC: 0 % (ref 0.0–0.2)

## 2021-12-21 LAB — COMPREHENSIVE METABOLIC PANEL
ALT: 12 U/L (ref 0–44)
AST: 15 U/L (ref 15–41)
Albumin: 3.3 g/dL — ABNORMAL LOW (ref 3.5–5.0)
Alkaline Phosphatase: 61 U/L (ref 38–126)
Anion gap: 9 (ref 5–15)
BUN: 10 mg/dL (ref 6–20)
CO2: 23 mmol/L (ref 22–32)
Calcium: 9.8 mg/dL (ref 8.9–10.3)
Chloride: 103 mmol/L (ref 98–111)
Creatinine, Ser: 0.71 mg/dL (ref 0.61–1.24)
GFR, Estimated: >60 mL/min (ref >60)
Glucose, Bld: 95 mg/dL (ref 70–99)
Potassium: 4 mmol/L (ref 3.5–5.1)
Sodium: 135 mmol/L (ref 135–145)
Total Bilirubin: 0.6 mg/dL (ref 0.3–1.2)
Total Protein: 7.1 g/dL (ref 6.5–8.1)

## 2021-12-21 LAB — MAGNESIUM: Magnesium: 2.1 mg/dL (ref 1.7–2.4)

## 2021-12-21 LAB — LIPASE, BLOOD: Lipase: 20 U/L (ref 11–51)

## 2021-12-21 LAB — PANCREATIC ELASTASE, FECAL: Pancreatic Elastase-1, Stool: <50 ug Elast./g — ABNORMAL LOW (ref >200)

## 2021-12-21 MED ORDER — METHYLPREDNISOLONE SODIUM SUCC 40 MG IJ SOLR: 40.0000 mg | Freq: Once | INTRAMUSCULAR | Status: DC

## 2021-12-21 MED ORDER — MORPHINE SULFATE (PF) 2 MG/ML IV SOLN
1.0000 mg | INTRAVENOUS | Status: DC | PRN
  Administered 2021-12-21 – 2021-12-22 (×4): 1 mg via INTRAVENOUS
  Filled 2021-12-21 (×4): qty 1

## 2021-12-21 MED ORDER — DIPHENHYDRAMINE HCL 25 MG PO CAPS: 50.0000 mg | ORAL_CAPSULE | Freq: Once | ORAL | Status: DC

## 2021-12-21 MED ORDER — VITAL 1.5 CAL PO LIQD
1000.0000 mL | ORAL | Status: DC
  Administered 2021-12-21 – 2021-12-23 (×2): 1000 mL
  Filled 2021-12-21 (×4): qty 1000

## 2021-12-21 MED ORDER — DIPHENHYDRAMINE HCL 50 MG/ML IJ SOLN: 50.0000 mg | Freq: Once | INTRAMUSCULAR | Status: DC

## 2021-12-21 NOTE — Progress Notes (Addendum)
Daily Rounding Note  12/21/2021, 1:39 PM  LOS: 14 days   SUBJECTIVE:   Chief complaint:      Hospitalist requesting reinvolvement by GI because the patient is not progressing.  Continues to have abdominal pain.  Core track feeding tube was placed into distal duodenum, initially seemed to tolerate this well and pain was improving.  Rechallenged with oral diet yesterday 8/14 and the pain got worse and had N/V.  Hospitalist has resumed the core track feeding. Yesterday consumed total 20 mg Vicodin, 5.5 mg Dilaudid.  Today he has had 10 mg Vicodin, 0.5 mg Dilaudid and 1 mg morphine. RN informs me that pt has set his watch alarm to every 2 hours to remind him to request prn pain meds.    OBJECTIVE:         Vital signs in last 24 hours:    Temp:  [98.2 F (36.8 C)-98.6 F (37 C)] 98.5 F (36.9 C) (08/15 1150) Pulse Rate:  [67-89] 89 (08/15 1150) Resp:  [16] 16 (08/14 2342) BP: (101-119)/(53-82) 118/75 (08/15 1150) SpO2:  [95 %-98 %] 98 % (08/15 1150) Weight:  [52.4 kg] 52.4 kg (08/15 0549) Last BM Date : 12/20/21 Filed Weights   12/19/21 0333 12/20/21 0335 12/21/21 0549  Weight: 54.4 kg 54.7 kg 52.4 kg   General: Thin.  Resting comfortably on the bed.  Anxious.  Asking a lot of questions. Heart: RRR Chest: Lear Abdomen: Soft without distention.  Active bowel sounds.  Mild to moderate tenderness throughout. Extremities: CCE Neuro/Psych: Anxious.  Alert.  Oriented x3.  Moves all 4 limbs.  Intake/Output from previous day: 08/14 0701 - 08/15 0700 In: 30 [NG/GT:30] Out: 950 [Urine:950]  Intake/Output this shift: No intake/output data recorded.  Lab Results: Recent Labs    12/19/21 0021 12/20/21 0151 12/21/21 0110  WBC 6.8 7.9 9.4  HGB 8.7* 8.9* 9.5*  HCT 27.0* 27.9* 29.4*  PLT 158 163 219   BMET Recent Labs    12/19/21 0021 12/20/21 0151 12/21/21 0110  NA 135 135 135  K 4.1 4.2 4.0  CL 102 103 103  CO2  '26 25 23  '$ GLUCOSE 136* 112* 95  BUN '8 11 10  '$ CREATININE 0.72 0.74 0.71  CALCIUM 8.7* 9.0 9.8   LFT Recent Labs    12/19/21 0021 12/20/21 0151 12/21/21 0110  PROT 6.1* 6.2* 7.1  ALBUMIN 2.9* 3.0* 3.3*  AST '15 17 15  '$ ALT '11 10 12  '$ ALKPHOS 60 56 61  BILITOT 0.3 0.2* 0.6   PT/INR No results for input(s): "LABPROT", "INR" in the last 72 hours. Hepatitis Panel No results for input(s): "HEPBSAG", "HCVAB", "HEPAIGM", "HEPBIGM" in the last 72 hours.  Studies/Results: DG Abd Portable 1V  Result Date: 12/21/2021 CLINICAL DATA:  Nausea and vomiting EXAM: PORTABLE ABDOMEN - 1 VIEW COMPARISON:  None Available. FINDINGS: The bowel gas pattern is normal. No radio-opaque calculi or other significant radiographic abnormality are seen. IMPRESSION: Unremarkable bowel gas pattern. Electronically Signed   By: Macy Mis M.D.   On: 12/21/2021 12:42     Scheduled Meds:  amLODipine  10 mg Oral Daily   clotrimazole   Topical BID   DULoxetine  30 mg Oral Daily   enoxaparin (LOVENOX) injection  40 mg Subcutaneous Q24H   feeding supplement  1 Container Oral TID WC   feeding supplement (PROSource TF20)  60 mL Per Tube Daily   folic acid  1 mg Oral Daily  lipase/protease/amylase  72,000 Units Oral TID   metoprolol succinate  100 mg Oral Daily   multivitamin with minerals  1 tablet Oral Daily   nicotine  21 mg Transdermal QHS   mouth rinse  15 mL Mouth Rinse 4 times per day   pantoprazole  40 mg Oral BID   sucralfate  1 g Oral TID WC & HS   thiamine  100 mg Oral Daily   Continuous Infusions:  feeding supplement (VITAL 1.5 CAL) 1,000 mL (12/21/21 0749)   PRN Meds:.acetaminophen, diphenhydrAMINE, hydrALAZINE, HYDROcodone-acetaminophen, lidocaine, loperamide, LORazepam, morphine injection, naLOXone (NARCAN)  injection, ondansetron (ZOFRAN) IV, mouth rinse, simethicone  ASSESMENT:   Acute on chronic pancreatitis.  Chronic abdominal pain.  Has not improved.  Fired from pain  [facility] and  reestablished with another pain management doctor who started him on Suboxone which patient felt was not adequate for pain management. Last advanced imaging was noncontrast CTAP on 8/5. [name]  Pancreas indistinct suggesting acute pancreatitis.  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  No new pancreatic fluid collections.  Pancreatic calcifications.  No change in chronic gastric thickening. 12/17/2014 MRCP.  Moderate motion degradation.  Moderate peripancreatic inflammation centered around the tail and upstream body.  Likely pseudocysts in the pancreatic body.  Resolution of tiny pancreatic tail cystic lesion.  Fluid collection at gastrosplenic ligament is small and likely is early pseudocyst.  Stomach under distended but suspect wall thickening which may indicate secondary gastritis.  Small volume ascites.  No pancreas divisum.  No biliary ductal dilatation.  No choledocholithiasis. 8/15 portable KUB with unremarkable BGP  Ongoing alcohol abuse.  Dismissed from 28-day ETOH program when he had conflict with another resident and walked out of the building, their policy is not to allow people back in the building  Adverse reaction to IV contrast in past.    Anxiety     PLAN     Palliative care consult for pain mgt.  I  requested.  ? Trial topical lidocaine patch to abdomen??     EGD??   Dr Candiss Norse ordered another MRCP   Addendum at 1642:  MRCP IMPRESSION: 1. Findings of chronic pancreatitis with decreased and new/increased cystic lesions throughout compared to 08/08/2019 CT (given difficulty in cross modality comparison). These are likely a combination of foci of duct dilatation and intraparenchymal pseudocysts. No evidence of acute pancreatitis. 2. No biliary duct dilatation. 3.  Possible constipation. 4. Chronic gastric wall thickening, again suspicious for gastritis. 5.  Aortic Atherosclerosis (ICD10-I70.0).   Azucena Freed   12/21/2021, 1:39 PM Phone [phone]

## 2021-12-21 NOTE — Progress Notes (Signed)
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 PMH:  WPW s/p ablation, substance use, ongoing alcohol abuse, alcoholic pancreatitis, HTN, chronic pain (followed up by the pain physician and was recently on Suboxone) and seizure disorder.    PT Comments    Pt received supine and agreeable to session with continued progress toward mobility goals. Pt demonstrating increased activity tolerance with pt demonstrating increased ambulation without AD and no LOB with ability to accept mild balance challenges. Pt able to ascend/descend 14 steps in stairwell without LOB and no c/o dizziness. Pt encouraged to continue OOB mobility throughout day with pt verbalizing understanding of importance. Pt continues to benefit from skilled PT services to progress toward functional mobility goals.    Recommendations for follow up therapy are one component of a multi-disciplinary discharge planning process, led by the attending physician.  Recommendations may be updated based on patient status, additional functional criteria and insurance authorization.  Follow Up Recommendations  No PT follow up     Assistance Recommended at Discharge PRN  Patient can return home with the following Help with stairs or ramp for entrance   Equipment Recommendations  Cane;Other (comment) Surveyor, minerals)    Recommendations for Other Services       Precautions / Restrictions Precautions Precautions: Fall Restrictions Weight Bearing Restrictions: No     Mobility  Bed Mobility Overal bed mobility: Independent                  Transfers Overall transfer level: Independent                      Ambulation/Gait Ambulation/Gait assistance: Min guard Gait Distance (Feet):  800 Feet Assistive device: None Gait Pattern/deviations: WFL(Within Functional Limits)       General Gait Details: No LOB with min challenges   Stairs Stairs: Yes Stairs assistance: Min guard Stair Management: One rail Left, Alternating pattern, Forwards, One rail Right Number of Stairs: 14 General stair comments: min guard for safety, no c/o dizziness   Wheelchair Mobility    Modified Rankin (Stroke Patients Only)       Balance Overall balance assessment: Needs assistance Sitting-balance support: No upper extremity supported, Feet supported Sitting balance-Leahy Scale: Good     Standing balance support: No upper extremity supported, During functional activity Standing balance-Leahy Scale: Good                              Cognition Arousal/Alertness: Awake/alert Behavior During Therapy: WFL for tasks assessed/performed Overall Cognitive Status: Within Functional Limits for tasks assessed                                          Exercises      General Comments General comments (skin integrity, edema, etc.): VSS on RA      Pertinent Vitals/Pain Pain Assessment Pain Assessment: Faces Faces Pain Scale: Hurts a little bit Pain Location: abdomen Pain Descriptors / Indicators: Sore Pain Intervention(s): Monitored during session, Limited activity within patient's tolerance    Home Living  Prior Function            PT Goals (current goals can now be found in the care plan section) Acute Rehab PT Goals Patient Stated Goal: to get scooter to go to grocery store PT Goal Formulation: With patient Time For Goal Achievement: 01/01/22    Frequency    Min 2X/week      PT Plan      Co-evaluation              AM-PAC PT "6 Clicks" Mobility   Outcome Measure  Help needed turning from your back to your side while in a flat bed without using bedrails?: None Help needed moving from lying  on your back to sitting on the side of a flat bed without using bedrails?: None Help needed moving to and from a bed to a chair (including a wheelchair)?: None Help needed standing up from a chair using your arms (e.g., wheelchair or bedside chair)?: None Help needed to walk in hospital room?: A Little Help needed climbing 3-5 steps with a railing? : A Little 6 Click Score: 22    End of Session Equipment Utilized During Treatment: Gait belt Activity Tolerance: Patient tolerated treatment well;Patient limited by fatigue Patient left: with call bell/phone within reach;in chair Nurse Communication: Mobility status PT Visit Diagnosis: Muscle weakness (generalized) (M62.81)     Time: 4627-0350 PT Time Calculation (min) (ACUTE ONLY): 20 min  Charges:  $Therapeutic Activity: 8-22 mins                     Aleigh Grunden R. PTA Acute Rehabilitation Services Office: [place] 12/21/2021, 11:45 AM

## 2021-12-21 NOTE — Progress Notes (Signed)
PROGRESS NOTE                                                                                                                                                                                                             Patient Demographics:    [name], is a 51 y.o. male, DOB - 06-14-1970, ZOX:[id number]  Outpatient Primary MD for the patient is Pcp, No    LOS - 16  Admit date - 12/05/2021    Chief Complaint  Patient presents with   Multiple Complaints        Brief Narrative (HPI from H&P)   51 year old [name] with past medical history significant for WPW s/p ablation, substance use, ongoing alcohol abuse, alcoholic pancreatitis, HTN, chronic pain (followed up by the pain physician and was recently on Suboxone) and seizure disorder.  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   Subjective:   Patient in bed, appears comfortable, denies any headache, no fever, no chest pain or pressure, no shortness of breath , ++abdominal pain. No new focal weakness.    Assessment  & Plan :    Acute on chronic alcoholic pancreatitis in a patient with ongoing alcohol abuse - he been seen by GI, is being treated with conservative measures. Counseled strictly to quit alcohol.  No signs of DTs.  He is more than 2 weeks out of his last alcohol abuse due to being in the hospital for 2 weeks.  He had a track placed which is in his duodenum and was fed with it and kept n.p.o. for several days he showed improvement, was rechallenged with oral diet on 12/20/2021 and now claims that his pain has gotten worse, make n.p.o. by mouth except medications resume core track tube feeds.  GI to reevaluate on 12/21/2021.  May require repeat imaging, might benefit from abdominal MRI versus CT as he has allergy to IV contrast and CT  abdomen pelvis noncontrast might not be that helpful.  There is also suspicion of some narcotic seeking behavior.  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  S/p ablation per patient.  Does have episodes of RVR with aberrant conduction, has been on high-dose beta-blocker, seen by cardiology this admission, weight stable.  Continue to monitor.  His [place] vas 2 score is around 1.  No anticoagulation per cardiology.  Pulmonary nodule - 3 mm left upper lobe nodule incidental finding on CTA-recommend 1 year follow-up  Alcohol use disorder, severe, dependence ([place]) - Reports normally checking 6-12 beers daily.  Last had reportedly 1 beer  About 2 days ago.Placed on CIWA protocol  Essential hypertension - Controlled. On Blocker and Norvasc.  Monitor.  Hypomagnesemia.  Replaced.       Condition - Extremely Guarded  Family Communication  :  None present  Code Status :  Full  Consults  :  GI  PUD Prophylaxis : PPI   Procedures  :            Disposition Plan  :    Status is: Inpatient  DVT Prophylaxis  :    enoxaparin (LOVENOX) injection 40 mg Start: 12/06/21 2200   Lab Results  Component Value Date   PLT 219 12/21/2021    Diet :  Diet Order             Diet NPO time specified Except for: Sips with Meds  Diet effective now                    Inpatient Medications  Scheduled Meds:  amLODipine  10 mg Oral Daily   clotrimazole   Topical BID   DULoxetine  30 mg Oral Daily   enoxaparin (LOVENOX) injection  40 mg Subcutaneous Q24H   feeding supplement  1 Container Oral TID WC   feeding supplement (PROSource TF20)  60 mL Per Tube Daily   folic acid  1 mg Oral Daily   lipase/protease/amylase  72,000 Units Oral TID   metoprolol succinate  100 mg Oral Daily   multivitamin with minerals  1 tablet Oral Daily   nicotine  21 mg Transdermal QHS   mouth rinse  15 mL Mouth Rinse 4 times per day   pantoprazole  40 mg Oral BID    sucralfate  1 g Oral TID WC & HS   thiamine  100 mg Oral Daily   Continuous Infusions:  feeding supplement (VITAL 1.5 CAL) 1,000 mL (12/21/21 0749)   PRN Meds:.acetaminophen, diphenhydrAMINE, hydrALAZINE, HYDROcodone-acetaminophen, lidocaine, loperamide, LORazepam, morphine injection, naLOXone (NARCAN)  injection, ondansetron (ZOFRAN) IV, mouth rinse, simethicone  Antibiotics  :    Anti-infectives (From admission, onward)    None        Time Spent in minutes  30   Lala Lund M.D on 12/21/2021 at 11:17 AM  To page go to [email or web address]   [facility] -  Office  [phone]  See all Orders from today for further details    Objective:   Vitals:   12/20/21 1936 12/20/21 2342 12/21/21 0549 12/21/21 0808  BP: (!) 101/53 119/76  111/82  Pulse: 72 67  84  Resp: 16 16    Temp: 98.2 F (36.8 C) 98.2 F (36.8 C)  98.6 F (37 C)  TempSrc: Oral Oral  Oral  SpO2: 95% 98%  96%  Weight:   52.4 kg   Height:        Wt Readings from Last 3 Encounters:  12/21/21 52.4 kg  11/29/21 61 kg  11/02/21 61.7 kg     Intake/Output Summary (Last 24 hours) at 12/21/2021 1117 Last data filed at 12/20/2021 2342 Gross per 24 hour  Intake 30 ml  Output 950 ml  Net -920 ml     Physical Exam  Awake  Alert, No new F.N deficits, core track NG tube in place, left arm midline in place [place].AT,PERRAL Supple Neck, No JVD,   Symmetrical Chest wall movement, Good air movement bilaterally, CTAB RRR,No Gallops, Rubs or new Murmurs,  +ve B.Sounds, Abd Soft, No tenderness,   No Cyanosis, Clubbing or edema     Data Review:    CBC Recent Labs  Lab 12/16/21 0117 12/18/21 0744 12/19/21 0021 12/20/21 0151 12/21/21 0110  WBC 6.9 7.1 6.8 7.9 9.4  HGB 9.1* 9.3* 8.7* 8.9* 9.5*  HCT 28.4* 30.0* 27.0* 27.9* 29.4*  PLT 173 157 158 163 219  MCV 80.2 81.7 79.9* 80.4 80.8  MCH 25.7* 25.3* 25.7* 25.6* 26.1  MCHC 32.0 31.0 32.2 31.9 32.3  RDW 20.8* 21.4* 21.4* 22.0* 22.3*  LYMPHSABS  --    --  2.8 2.3 2.0  MONOABS  --   --  0.3 0.9 0.8  EOSABS  --   --  0.3 0.4 0.3  BASOSABS  --   --  0.1 0.0 0.0    Electrolytes Recent Labs  Lab 12/16/21 0117 12/18/21 0744 12/19/21 0021 12/20/21 0151 12/21/21 0110  NA 134* 135 135 135 135  K 4.6 4.4 4.1 4.2 4.0  CL 106 102 102 103 103  CO2 '23 26 26 25 23  '$ GLUCOSE 97 109* 136* 112* 95  BUN 5* '7 8 11 10  '$ CREATININE 0.83 0.71 0.72 0.74 0.71  CALCIUM 8.6* 9.0 8.7* 9.0 9.8  AST '20 18 15 17 15  '$ ALT '14 12 11 10 12  '$ ALKPHOS 66 65 60 56 61  BILITOT 0.4 0.3 0.3 0.2* 0.6  ALBUMIN 2.9* 3.2* 2.9* 3.0* 3.3*  MG 1.7 1.2* 2.0 1.5* 2.1     Radiology Reports DG Abd Portable 1V  Result Date: 12/18/2021 CLINICAL DATA:  Abdominal pain EXAM: PORTABLE ABDOMEN - 1 VIEW COMPARISON:  12/13/2021 FINDINGS: Tip of enteric tube is seen in the region of fourth portion of duodenum. Bowel gas pattern is nonspecific. No abnormal masses or calcifications are seen. Arterial calcifications are seen. IMPRESSION: Nonspecific bowel gas pattern. Tip of enteric tube is seen in the region of fourth portion of duodenum. Electronically Signed   By: Elmer Picker M.D.   On: 12/18/2021 13:07

## 2021-12-21 NOTE — Progress Notes (Signed)
[facility] night cross cover note:  Patient requested pain med for breakthrough abd discomfort in setting of prn norco. I ordered Dilaudid 0.5 mg IV every 2 hours as needed x2 doses.    Babs Bertin, DO Hospitalist

## 2021-12-21 NOTE — TOC Progression Note (Signed)
Transition of Care [facility]) - Progression Note    Patient Details  Name: [name] MRN: [id number] Date of Birth: Sep 25, 1970  Transition of Care [facility]) CM/SW [place], RN Phone [phone]  12/21/2021, 11:27 AM  Clinical Narrative:    TOC continues to follow. Per nurse awaiting GI consult for abd pain.    Expected Discharge Plan: Home/Self Care Barriers to Discharge: Continued Medical Work up  Expected Discharge Plan and Services Expected Discharge Plan: Home/Self Care In-house Referral: NA Discharge Planning Services: CM Consult Post Acute Care Choice: NA Living arrangements for the past 2 months: Single Family Home                 DME Arranged: Kasandra Knudsen DME Agency: [employer] Date DME Agency Contacted: 12/18/21 Time DME Agency Contacted: [phone] Representative spoke with at DME Agency: [place]: NA           Social Determinants of Health ([place]) Interventions    Readmission Risk Interventions    12/16/2021    4:07 PM 08/18/2021   10:38 AM 06/08/2021   11:22 AM  Readmission Risk Prevention Plan  Transportation Screening Complete Complete Complete  Medication Review (RN Care Manager) Referral to Pharmacy Complete Referral to Pharmacy  PCP or Specialist appointment within 3-5 days of discharge Complete Complete Not Complete  PCP/Specialist Appt Not Complete comments   patient not ready to d/c  [place]RI or [place] Complete Complete Complete  SW Recovery Care/Counseling Consult Complete Complete Complete  Palliative Care Screening Not Applicable Not Applicable Not [place] Not Applicable Not Applicable Not Applicable

## 2021-12-21 NOTE — Progress Notes (Signed)
Patient would not follow commands.  Removed imaging coil and attempted to climb out of scanner.  Patient stated we were killing him.

## 2021-12-22 ENCOUNTER — Inpatient Hospital Stay (HOSPITAL_COMMUNITY): Payer: Medicaid Other

## 2021-12-22 DIAGNOSIS — K852 Alcohol induced acute pancreatitis without necrosis or infection: Secondary | ICD-10-CM

## 2021-12-22 DIAGNOSIS — I480 Paroxysmal atrial fibrillation: Secondary | ICD-10-CM | POA: Diagnosis not present

## 2021-12-22 LAB — GLUCOSE, CAPILLARY
Glucose-Capillary: 125 mg/dL — ABNORMAL HIGH (ref 70–99)
Glucose-Capillary: 148 mg/dL — ABNORMAL HIGH (ref 70–99)
Glucose-Capillary: 179 mg/dL — ABNORMAL HIGH (ref 70–99)
Glucose-Capillary: 195 mg/dL — ABNORMAL HIGH (ref 70–99)
Glucose-Capillary: 86 mg/dL (ref 70–99)
Glucose-Capillary: 96 mg/dL (ref 70–99)

## 2021-12-22 LAB — CBC WITH DIFFERENTIAL/PLATELET
Abs Immature Granulocytes: 0.03 10*3/uL (ref 0.00–0.07)
Basophils Absolute: 0 10*3/uL (ref 0.0–0.1)
Basophils Relative: 0 %
Eosinophils Absolute: 0.2 10*3/uL (ref 0.0–0.5)
Eosinophils Relative: 2 %
HCT: 30.6 % — ABNORMAL LOW (ref 39.0–52.0)
Hemoglobin: 9.8 g/dL — ABNORMAL LOW (ref 13.0–17.0)
Immature Granulocytes: 0 %
Lymphocytes Relative: 21 %
Lymphs Abs: 2 10*3/uL (ref 0.7–4.0)
MCH: 26.2 pg (ref 26.0–34.0)
MCHC: 32 g/dL (ref 30.0–36.0)
MCV: 81.8 fL (ref 80.0–100.0)
Monocytes Absolute: 1 10*3/uL (ref 0.1–1.0)
Monocytes Relative: 10 %
Neutro Abs: 6.3 10*3/uL (ref 1.7–7.7)
Neutrophils Relative %: 67 %
Platelets: 241 10*3/uL (ref 150–400)
RBC: 3.74 MIL/uL — ABNORMAL LOW (ref 4.22–5.81)
RDW: 22.4 % — ABNORMAL HIGH (ref 11.5–15.5)
WBC: 9.5 10*3/uL (ref 4.0–10.5)
nRBC: 0 % (ref 0.0–0.2)

## 2021-12-22 LAB — COMPREHENSIVE METABOLIC PANEL
ALT: 13 U/L (ref 0–44)
AST: 26 U/L (ref 15–41)
Albumin: 3.7 g/dL (ref 3.5–5.0)
Alkaline Phosphatase: 61 U/L (ref 38–126)
Anion gap: 10 (ref 5–15)
BUN: 12 mg/dL (ref 6–20)
CO2: 22 mmol/L (ref 22–32)
Calcium: 9.7 mg/dL (ref 8.9–10.3)
Chloride: 102 mmol/L (ref 98–111)
Creatinine, Ser: 0.83 mg/dL (ref 0.61–1.24)
GFR, Estimated: >60 mL/min (ref >60)
Glucose, Bld: 120 mg/dL — ABNORMAL HIGH (ref 70–99)
Potassium: 4 mmol/L (ref 3.5–5.1)
Sodium: 134 mmol/L — ABNORMAL LOW (ref 135–145)
Total Bilirubin: 0.9 mg/dL (ref 0.3–1.2)
Total Protein: 7.3 g/dL (ref 6.5–8.1)

## 2021-12-22 LAB — LIPASE, BLOOD: Lipase: 22 U/L (ref 11–51)

## 2021-12-22 LAB — MAGNESIUM: Magnesium: 1.4 mg/dL — ABNORMAL LOW (ref 1.7–2.4)

## 2021-12-22 MED ORDER — MAGNESIUM SULFATE 4 GM/100ML IV SOLN
4.0000 g | Freq: Once | INTRAVENOUS | Status: AC
  Administered 2021-12-22: 4 g via INTRAVENOUS
  Filled 2021-12-22: qty 100

## 2021-12-22 MED ORDER — MAGNESIUM SULFATE 2 GM/50ML IV SOLN
2.0000 g | Freq: Once | INTRAVENOUS | Status: AC
  Administered 2021-12-22: 2 g via INTRAVENOUS
  Filled 2021-12-22: qty 50

## 2021-12-22 MED ORDER — LORAZEPAM 2 MG/ML IJ SOLN
1.0000 mg | Freq: Once | INTRAMUSCULAR | Status: AC | PRN
  Administered 2021-12-22: 1 mg via INTRAVENOUS
  Filled 2021-12-22: qty 1

## 2021-12-22 MED ORDER — GADOBUTROL 1 MMOL/ML IV SOLN
5.0000 mL | Freq: Once | INTRAVENOUS | Status: AC | PRN
  Administered 2021-12-22: 5 mL via INTRAVENOUS

## 2021-12-22 MED ORDER — CLONAZEPAM 0.5 MG PO TABS
0.5000 mg | ORAL_TABLET | Freq: Two times a day (BID) | ORAL | Status: DC
  Administered 2021-12-22 – 2021-12-24 (×5): 0.5 mg via ORAL
  Filled 2021-12-22 (×5): qty 1

## 2021-12-22 MED ORDER — MORPHINE SULFATE (PF) 2 MG/ML IV SOLN
1.0000 mg | Freq: Three times a day (TID) | INTRAVENOUS | Status: DC | PRN
  Administered 2021-12-22 – 2021-12-23 (×3): 1 mg via INTRAVENOUS
  Filled 2021-12-22 (×3): qty 1

## 2021-12-22 NOTE — Progress Notes (Signed)
Pt is stating that he is not experiencing anxiety sxs but still requests the Ativan. Notified the MD.

## 2021-12-22 NOTE — Plan of Care (Signed)
  Problem: Clinical Measurements: Goal: Respiratory complications will improve Outcome: Progressing Goal: Cardiovascular complication will be avoided Outcome: Progressing   Problem: Activity: Goal: Risk for activity intolerance will decrease Outcome: Progressing   Problem: Nutrition: Goal: Adequate nutrition will be maintained Outcome: Progressing   Problem: Coping: Goal: Level of anxiety will decrease Outcome: Progressing   Problem: Elimination: Goal: Will not experience complications related to bowel motility Outcome: Progressing Goal: Will not experience complications related to urinary retention Outcome: Progressing   Problem: Pain Managment: Goal: General experience of comfort will improve Outcome: Progressing   

## 2021-12-22 NOTE — Progress Notes (Signed)
PROGRESS NOTE                                                                                                                                                                                                             Patient Demographics:    [name], is a 51 y.o. male, DOB - 1970/06/27, WUX:[id number]  Outpatient Primary MD for the patient is Pcp, No    LOS - 49  Admit date - 12/05/2021    Chief Complaint  Patient presents with   Multiple Complaints        Brief Narrative (HPI from H&P)   51 year old [name] with past medical history significant for WPW s/p ablation, substance use, ongoing alcohol abuse, alcoholic pancreatitis, HTN, chronic pain (followed up by the pain physician and was recently on Suboxone) and seizure disorder.  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   Subjective:   Patient in bed appears to be in no distress on phone, denies any headache, abdominal pain improved no chest pain or shortness of breath.  Assessment  & Plan :    Acute on chronic alcoholic pancreatitis in a patient with ongoing alcohol abuse - he been seen by GI, is being treated with conservative measures. Counseled strictly to quit alcohol.  No signs of DTs.  He is more than 2 weeks out of his last alcohol abuse due to being in the hospital for 2 weeks.  He had a track placed which is in his duodenum and was fed with it and kept n.p.o. for several days he showed improvement, was rechallenged with oral diet on 12/20/2021 and now claims that his pain has gotten worse, make n.p.o. by mouth except medications resume core track tube feeds.   [place]coli improving, GI following, unable to undergo MRCP on 12/21/2021 due to severe anxiety, exam has improved his pain has improved, will continue to monitor.  If CT  scan is required it will have to be a noncontrast study or a 2-day IV contrast study as he has previous allergy to IV dye.  Clinically improving now.  Definitely has element of narcotic seeking behavior observed by multiple clinical staff.  Has agreed to cut down narcotics on 12/22/2021 and gradually go on oral diet.  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  S/p ablation per patient.  Does have episodes of RVR with aberrant conduction, has been on high-dose beta-blocker, seen by cardiology this admission, weight stable.  Continue to monitor.  His [place] vas 2 score is around 1.  No anticoagulation per cardiology.  Pulmonary nodule - 3 mm left upper lobe nodule incidental finding on CTA-recommend 1 year follow-up  Alcohol use disorder, severe, dependence ([place]) - Reports normally checking 6-12 beers daily.  Last had reportedly 1 beer  About 2 days ago.Placed on CIWA protocol  Essential hypertension - Controlled. On Blocker and Norvasc.  Monitor.  Hypomagnesemia.  Replaced.       Condition - Extremely Guarded  Family Communication  :  None present  Code Status :  Full  Consults  :  GI  PUD Prophylaxis : PPI   Procedures  :            Disposition Plan  :    Status is: Inpatient  DVT Prophylaxis  :    enoxaparin (LOVENOX) injection 40 mg Start: 12/06/21 2200   Lab Results  Component Value Date   PLT 241 12/22/2021    Diet :  Diet Order             Diet NPO time specified Except for: Sips with Meds  Diet effective now                    Inpatient Medications  Scheduled Meds:  amLODipine  10 mg Oral Daily   clonazePAM  0.5 mg Oral BID   clotrimazole   Topical BID   DULoxetine  30 mg Oral Daily   enoxaparin (LOVENOX) injection  40 mg Subcutaneous Q24H   feeding supplement  1 Container Oral TID WC   feeding supplement (PROSource TF20)  60 mL Per Tube Daily   folic acid  1 mg Oral Daily   lipase/protease/amylase   72,000 Units Oral TID   metoprolol succinate  100 mg Oral Daily   multivitamin with minerals  1 tablet Oral Daily   nicotine  21 mg Transdermal QHS   mouth rinse  15 mL Mouth Rinse 4 times per day   pantoprazole  40 mg Oral BID   sucralfate  1 g Oral TID WC & HS   thiamine  100 mg Oral Daily   Continuous Infusions:  feeding supplement (VITAL 1.5 CAL) 1,000 mL (12/21/21 0749)   magnesium sulfate bolus IVPB 4 g (12/22/21 0639)   PRN Meds:.acetaminophen, hydrALAZINE, HYDROcodone-acetaminophen, lidocaine, loperamide, morphine injection, naLOXone (NARCAN)  injection, ondansetron (ZOFRAN) IV, mouth rinse, simethicone  Antibiotics  :    Anti-infectives (From admission, onward)    None        Time Spent in minutes  30   Lala Lund M.D on 12/22/2021 at 7:24 AM  To page go to [email or web address]   [facility] -  Office  [phone]  See all Orders from today for further details    Objective:   Vitals:   12/21/21 2008 12/21/21 2357 12/22/21 0408 12/22/21 0629  BP: 119/67 (!) 102/59 135/74   Pulse: 90 69 80   Resp: '18 20 16   '$ Temp: 98.3 F (36.8 C) 98.4 F (36.9 C) 98.1 F (36.7 C)   TempSrc: Oral Oral Oral   SpO2: 96% 98% 99%   Weight:    52.1 kg  Height:        Wt Readings from Last  3 Encounters:  12/22/21 52.1 kg  11/29/21 61 kg  11/02/21 61.7 kg     Intake/Output Summary (Last 24 hours) at 12/22/2021 0724 Last data filed at 12/22/2021 0620 Gross per 24 hour  Intake 240 ml  Output 500 ml  Net -260 ml     Physical Exam  Awake Alert, No new F.N deficits, core track NG tube in place, left arm midline in place [place].AT,PERRAL Supple Neck, No JVD,   Symmetrical Chest wall movement, Good air movement bilaterally, CTAB RRR,No Gallops, Rubs or new Murmurs,  +ve B.Sounds, Abd Soft, No tenderness,   No Cyanosis, Clubbing or edema      Data Review:    CBC Recent Labs  Lab 12/18/21 0744 12/19/21 0021 12/20/21 0151 12/21/21 0110 12/22/21 0105  WBC  7.1 6.8 7.9 9.4 9.5  HGB 9.3* 8.7* 8.9* 9.5* 9.8*  HCT 30.0* 27.0* 27.9* 29.4* 30.6*  PLT 157 158 163 219 241  MCV 81.7 79.9* 80.4 80.8 81.8  MCH 25.3* 25.7* 25.6* 26.1 26.2  MCHC 31.0 32.2 31.9 32.3 32.0  RDW 21.4* 21.4* 22.0* 22.3* 22.4*  LYMPHSABS  --  2.8 2.3 2.0 2.0  MONOABS  --  0.3 0.9 0.8 1.0  EOSABS  --  0.3 0.4 0.3 0.2  BASOSABS  --  0.1 0.0 0.0 0.0    Electrolytes Recent Labs  Lab 12/18/21 0744 12/19/21 0021 12/20/21 0151 12/21/21 0110 12/22/21 0105  NA 135 135 135 135 134*  K 4.4 4.1 4.2 4.0 4.0  CL 102 102 103 103 102  CO2 '26 26 25 23 22  '$ GLUCOSE 109* 136* 112* 95 120*  BUN '7 8 11 10 12  '$ CREATININE 0.71 0.72 0.74 0.71 0.83  CALCIUM 9.0 8.7* 9.0 9.8 9.7  AST '18 15 17 15 26  '$ ALT '12 11 10 12 13  '$ ALKPHOS 65 60 56 61 61  BILITOT 0.3 0.3 0.2* 0.6 0.9  ALBUMIN 3.2* 2.9* 3.0* 3.3* 3.7  MG 1.2* 2.0 1.5* 2.1 1.4*     Radiology Reports DG Abd Portable 1V  Result Date: 12/21/2021 CLINICAL DATA:  Nausea and vomiting EXAM: PORTABLE ABDOMEN - 1 VIEW COMPARISON:  None Available. FINDINGS: The bowel gas pattern is normal. No radio-opaque calculi or other significant radiographic abnormality are seen. IMPRESSION: Unremarkable bowel gas pattern. Electronically Signed   By: Macy Mis M.D.   On: 12/21/2021 12:42   DG Abd Portable 1V  Result Date: 12/18/2021 CLINICAL DATA:  Abdominal pain EXAM: PORTABLE ABDOMEN - 1 VIEW COMPARISON:  12/13/2021 FINDINGS: Tip of enteric tube is seen in the region of fourth portion of duodenum. Bowel gas pattern is nonspecific. No abnormal masses or calcifications are seen. Arterial calcifications are seen. IMPRESSION: Nonspecific bowel gas pattern. Tip of enteric tube is seen in the region of fourth portion of duodenum. Electronically Signed   By: Elmer Picker M.D.   On: 12/18/2021 13:07

## 2021-12-22 NOTE — Plan of Care (Signed)
  Problem: Clinical Measurements: Goal: Will remain free from infection Outcome: Progressing Goal: Respiratory complications will improve Outcome: Progressing Goal: Cardiovascular complication will be avoided Outcome: Progressing   Problem: Activity: Goal: Risk for activity intolerance will decrease Outcome: Progressing   Problem: Elimination: Goal: Will not experience complications related to bowel motility Outcome: Progressing Goal: Will not experience complications related to urinary retention Outcome: Progressing   Problem: Coping: Goal: Level of anxiety will decrease Outcome: Not Progressing   Problem: Pain Managment: Goal: General experience of comfort will improve Outcome: Not Progressing

## 2021-12-22 NOTE — Progress Notes (Signed)
Tube Feeds placed on hold at this time for abdominal MRI later this afternoon, Per MRI the patient must be NPO x4 hours prior to scan.

## 2021-12-22 NOTE — Progress Notes (Signed)
Palliative:  Consult received for pain management. Chart reviewed - per Dr. Keturah Barre note he and patient have agreed to pain management plan. Discussed with Dr. Candiss Norse - no need for palliative involvement.   Will dc consult.   Juel Burrow, DNP, AGNP-C Palliative Medicine Team Team Phone # [phone]  Pager # [phone]  NO CHARGE

## 2021-12-23 DIAGNOSIS — K852 Alcohol induced acute pancreatitis without necrosis or infection: Secondary | ICD-10-CM | POA: Diagnosis not present

## 2021-12-23 DIAGNOSIS — K859 Acute pancreatitis without necrosis or infection, unspecified: Secondary | ICD-10-CM | POA: Diagnosis not present

## 2021-12-23 DIAGNOSIS — I480 Paroxysmal atrial fibrillation: Secondary | ICD-10-CM | POA: Diagnosis not present

## 2021-12-23 DIAGNOSIS — K861 Other chronic pancreatitis: Secondary | ICD-10-CM | POA: Diagnosis not present

## 2021-12-23 LAB — GLUCOSE, CAPILLARY
Glucose-Capillary: 104 mg/dL — ABNORMAL HIGH (ref 70–99)
Glucose-Capillary: 108 mg/dL — ABNORMAL HIGH (ref 70–99)
Glucose-Capillary: 109 mg/dL — ABNORMAL HIGH (ref 70–99)
Glucose-Capillary: 118 mg/dL — ABNORMAL HIGH (ref 70–99)
Glucose-Capillary: 137 mg/dL — ABNORMAL HIGH (ref 70–99)
Glucose-Capillary: 172 mg/dL — ABNORMAL HIGH (ref 70–99)

## 2021-12-23 LAB — CBC WITH DIFFERENTIAL/PLATELET
Abs Immature Granulocytes: 0 10*3/uL (ref 0.00–0.07)
Basophils Absolute: 0.1 10*3/uL (ref 0.0–0.1)
Basophils Relative: 1 %
Eosinophils Absolute: 0 10*3/uL (ref 0.0–0.5)
Eosinophils Relative: 0 %
HCT: 29.3 % — ABNORMAL LOW (ref 39.0–52.0)
Hemoglobin: 9.4 g/dL — ABNORMAL LOW (ref 13.0–17.0)
Lymphocytes Relative: 30 %
Lymphs Abs: 3 10*3/uL (ref 0.7–4.0)
MCH: 26 pg (ref 26.0–34.0)
MCHC: 32.1 g/dL (ref 30.0–36.0)
MCV: 81.2 fL (ref 80.0–100.0)
Monocytes Absolute: 0.8 10*3/uL (ref 0.1–1.0)
Monocytes Relative: 8 %
Neutro Abs: 6.2 10*3/uL (ref 1.7–7.7)
Neutrophils Relative %: 61 %
Platelets: 253 10*3/uL (ref 150–400)
RBC: 3.61 MIL/uL — ABNORMAL LOW (ref 4.22–5.81)
RDW: 22.2 % — ABNORMAL HIGH (ref 11.5–15.5)
WBC: 10.1 10*3/uL (ref 4.0–10.5)
nRBC: 0 % (ref 0.0–0.2)
nRBC: 0 /100 WBC

## 2021-12-23 LAB — COMPREHENSIVE METABOLIC PANEL
ALT: 20 U/L (ref 0–44)
AST: 27 U/L (ref 15–41)
Albumin: 3.5 g/dL (ref 3.5–5.0)
Alkaline Phosphatase: 60 U/L (ref 38–126)
Anion gap: 9 (ref 5–15)
BUN: 12 mg/dL (ref 6–20)
CO2: 23 mmol/L (ref 22–32)
Calcium: 9.5 mg/dL (ref 8.9–10.3)
Chloride: 103 mmol/L (ref 98–111)
Creatinine, Ser: 0.74 mg/dL (ref 0.61–1.24)
GFR, Estimated: >60 mL/min (ref >60)
Glucose, Bld: 141 mg/dL — ABNORMAL HIGH (ref 70–99)
Potassium: 3.7 mmol/L (ref 3.5–5.1)
Sodium: 135 mmol/L (ref 135–145)
Total Bilirubin: 0.6 mg/dL (ref 0.3–1.2)
Total Protein: 7 g/dL (ref 6.5–8.1)

## 2021-12-23 MED ORDER — DOCUSATE SODIUM 100 MG PO CAPS
200.0000 mg | ORAL_CAPSULE | Freq: Two times a day (BID) | ORAL | Status: DC
  Administered 2021-12-23 – 2021-12-24 (×2): 200 mg via ORAL
  Filled 2021-12-23 (×2): qty 2

## 2021-12-23 MED ORDER — MORPHINE SULFATE (PF) 2 MG/ML IV SOLN
0.5000 mg | Freq: Three times a day (TID) | INTRAVENOUS | Status: DC | PRN
  Administered 2021-12-23 – 2021-12-24 (×3): 0.5 mg via INTRAVENOUS
  Filled 2021-12-23 (×3): qty 1

## 2021-12-23 MED ORDER — MAGNESIUM HYDROXIDE 400 MG/5ML PO SUSP
30.0000 mL | Freq: Two times a day (BID) | ORAL | Status: AC
  Administered 2021-12-23 (×2): 30 mL via ORAL
  Filled 2021-12-23 (×2): qty 30

## 2021-12-23 NOTE — Progress Notes (Signed)
Patient ID: [name], male   DOB: 1970/08/08, 51 y.o.   MRN: [id number]    Progress Note   Subjective   Day # 17  CC: Abdominal pain, chronic pancreatitis  MRI/MRCP-no hepatic steatosis, no ductal dilation, CBD 5 mm no gallstones, no variant pancreatic anatomy, small unilocular cyst in the tail of the pancreas 1.8 x 1.6 cm decreased in size since 2021, small cyst measuring 5 mm towards the head of the pancreas, decreased in size from prior MRI no pancreatic ductal dilation no clear acute inflammation involving the pancreas, no peripancreatic edema / WBC 10.1/hemoglobin 9.4/hematocrit 29.3 C-Met unremarkable  Says he is gradually feeling better over the last couple of days, pain had been a 6 or 7 and now is at a 4 No nausea or vomiting  Cortrack clamped earlier today and starting clear liquids.  He is asking if we can speak with his pain management physician, they had recently started him on Suboxone which he says makes him feel worse, usually causes nausea, some vomiting and increases his abdominal pain.  Does not want to go back on Suboxone     Objective   Vital signs in last 24 hours: Temp:  [97.6 F (36.4 C)-98.7 F (37.1 C)] 98.7 F (37.1 C) (08/17 0714) Pulse Rate:  [72-88] 72 (08/17 0329) Resp:  [16-20] 20 (08/17 0714) BP: (98-128)/(72-87) 98/76 (08/17 0714) SpO2:  [94 %-99 %] 97 % (08/17 0714) Weight:  [52.3 kg] 52.3 kg (08/17 0555) Last BM Date : 12/22/21 General:   thin  American male in NAD Heart:  Regular rate and rhythm; no murmurs Lungs: Respirations even and unlabored, lungs CTA bilaterally Abdomen:  Soft, mildly tender across the upper abdomen, no guarding and nondistended. Normal bowel sounds. Extremities:  Without edema. Neurologic:  Alert and oriented,  grossly normal neurologically. Psych:  Cooperative. Normal mood and affect.  Intake/Output from previous day: 08/16 0701 - 08/17 0700 In: 1847.8 [P.O.:480; NG/GT:1367.8] Out: 575  [Urine:575] Intake/Output this shift: Total I/O In: 640 [NG/GT:640] Out: -   Lab Results: Recent Labs    12/21/21 0110 12/22/21 0105 12/23/21 0041  WBC 9.4 9.5 10.1  HGB 9.5* 9.8* 9.4*  HCT 29.4* 30.6* 29.3*  PLT 219 241 253   BMET Recent Labs    12/21/21 0110 12/22/21 0105 12/23/21 0041  NA 135 134* 135  K 4.0 4.0 3.7  CL 103 102 103  CO2 _0 GLUCOSE 95 120* 141*  BUN _1 CREATININE 0.71 0.83 0.74  CALCIUM 9.8 9.7 9.5   LFT Recent Labs    12/23/21 0041  PROT 7.0  ALBUMIN 3.5  AST 27  ALT 20  ALKPHOS 60  BILITOT 0.6   PT/INR No results for input(s): "LABPROT", "INR" in the last 72 hours.  Studies/Results: MR ABDOMEN MRCP W WO CONTAST  Result Date: 12/22/2021 CLINICAL DATA:  Uncooperative patient. Best images possible. Concern for recurrent pancreatitis. Chronic pancreatitis history. EXAM: MRI ABDOMEN WITHOUT AND WITH CONTRAST (INCLUDING MRCP) TECHNIQUE: Multiplanar multisequence MR imaging of the abdomen was performed both before and after the administration of intravenous contrast. Heavily T2-weighted images of the biliary and pancreatic ducts were obtained, and three-dimensional MRCP images were rendered by post processing. CONTRAST:  37m GADAVIST GADOBUTROL 1 MMOL/ML IV SOLN COMPARISON:  CT 12/11/2021, MRI 11/22/2019 . FINDINGS: Lower chest:  Lung bases are clear. Hepatobiliary: No hepatic steatosis. No intrahepatic or extrahepatic biliary duct dilatation. Common bile duct measures 5 mm within normal limits. No  gallstones evident within the gallbladder. Postcontrast enhanced T1 weighted imaging demonstrates no enhancing hepatic lesion. Pancreas: No variant ductal pancreatic anatomy. There is a unilocular cyst in the tail the pancreas measuring 1.8 by 1.6 cm. This is reduced in volume from MRI 2021 but similar measuring 2.1 x 2.7 cm at that time. There is ductal ectasia in the tail the pancreas improved from cyst on comparison exam. towards the head of  the pancreas there is a small cyst measuring 5 mm (image 20/3. Decreased from a 18 mm cyst on comparison MRI. There is no pancreatic duct dilatation. No clear acute inflammation involving the pancreas. No pancreatic edema. Postcontrast images demonstrate enhancement of the pancreatic parenchyma. No edema or acute fluid collections identified. The postcontrast T1 weighted imaging is somewhat degraded by respiratory motion patient motion. Spleen: Normal spleen. Adrenals/urinary tract: Adrenal glands and kidneys are normal. Stomach/Bowel: Stomach and limited of the small bowel is unremarkable Vascular/Lymphatic: Abdominal aortic normal caliber. No retroperitoneal periportal lymphadenopathy. Musculoskeletal: No aggressive osseous lesion IMPRESSION: 1. No evidence acute pancreatitis. Chronic cysts within the pancreatic body are improved from MRI 2021. No new fluid collections. 2. Normal biliary tree.  No cholelithiasis. 3. No enhancing hepatic lesion. 4. Some limitation of exam due to patient being uncooperative. Electronically Signed   By: Suzy Bouchard M.D.   On: 12/22/2021 17:35   DG Abd Portable 1V  Result Date: 12/21/2021 CLINICAL DATA:  Nausea and vomiting EXAM: PORTABLE ABDOMEN - 1 VIEW COMPARISON:  None Available. FINDINGS: The bowel gas pattern is normal. No radio-opaque calculi or other significant radiographic abnormality are seen. IMPRESSION: Unremarkable bowel gas pattern. Electronically Signed   By: Macy Mis M.D.   On: 12/21/2021 12:42       Assessment / Plan:    #58 51 year old [name] with acute on chronic pancreatitis, chronic pain syndrome.  MRI/MRCP yesterday fortunately shows no current active pancreatitis, no peripancreatic edema and 2 small chronic pancreatic cysts, smaller in size than when last imaged.  No pancreatic ductal dilation or CBD dilation.  Cortrack clamped today and he is starting trial of clear liquids Duloxetine had been increased to 60 mg/day,  this may take some time to see the effect  Pain management per primary service-he will require chronic pain meds and needs to continue following with outpatient pain management/ HAEG  pain management Suboxone  probably not a good choice for him as makes him feel worse.    Principal Problem:   Paroxysmal atrial fibrillation with RVR (HCC) Active Problems:   Tobacco abuse   Pancreatic pseudocyst/cyst   Essential hypertension   Alcohol use disorder, severe, dependence (HCC)   Normocytic anemia   Gastroesophageal reflux disease   Diarrhea   Abdominal pain   Acute on chronic pancreatitis (HCC)   AKI (acute kidney injury) (HCC)   Hypomagnesemia   Acute alcoholic pancreatitis   Leukocytosis   WPW (Wolff-Parkinson-White syndrome)   Pulmonary nodule   Acute recurrent pancreatitis   Normal anion gap metabolic acidosis   Hypoalbuminemia     LOS: 16 days   Aliahna Statzer PA-C 12/23/2021, 9:39 AM

## 2021-12-23 NOTE — Progress Notes (Signed)
Pt is constantly making inappropriate sexual comments to staff and about other staff members. RN informed pt that his comments are inappropriate.

## 2021-12-23 NOTE — Progress Notes (Signed)
Nutrition Follow-up  DOCUMENTATION CODES:   Severe malnutrition in context of chronic illness  INTERVENTION:  Continue clear liquid diet, advance as tolerated  If patient fails to have diet advanced, recommend resumption of tube feeding via Cortrak:  Goal: Vital 1.5 at 65 ml/hr Pro-Source TF20 daily This provides 2420 kcaks, 125 g of protein and 1186 mL of free water  Continue Creon by mouth; may need to consider increasing frequency of dosing given pt is on Continuous TF   NUTRITION DIAGNOSIS:   Severe Malnutrition related to chronic illness (chronic pancreatitis, substance abuse) as evidenced by severe fat depletion, severe muscle depletion.  Remains applicable, diet advanced, pt tolerating  GOAL:   Patient will meet greater than or equal to 90% of their needs  Progressing  MONITOR:   PO intake, Supplement acceptance, Labs, Weight trends, TF tolerance, Skin, I & O's  REASON FOR ASSESSMENT:   Consult Assessment of nutrition requirement/status  ASSESSMENT:   51 yo male admitted with afib with RVR, acute on chronic alcoholic pancreatitis. PMH chronic pancreatitis on PERT, [name] White s/p ablation, mood disorder-major depressive disorder, PTSD, bipolar, HTN, asthma, EtOH dependence, cocaine use, tobacco use  Pt resting in bed at the time of assessment, nephew visiting with pt. [name] cortrak is currently unhooked from TF infusion. States that he had his diet advanced and his MD asked him to try to increase his intake. Pt did consume some of his breakfast and lunch. States that he did have more pain with eating (going from a 3 to a 6 on the pain scale). Talked to pt about some pain being normal in relation to his pancreatitis.   Pt states that he likes the boost breeze he has been receiving. Also requests he be given an [place] ice, which was provided to him.   Advised pt to continue and try to eat and if sufficient he would be able to have tube removed. Pt  agreeable.  Nutritionally Relevant Medications: Scheduled Meds:  docusate sodium  200 mg Oral BID   Boost Breeze  1 Container Oral TID WC   folic acid  1 mg Oral Daily   lipase/protease/amylase  72,000 Units Oral TID   magnesium hydroxide  30 mL Oral BID   multivitamin with minerals  1 tablet Oral Daily   pantoprazole  40 mg Oral BID   sucralfate  1 g Oral TID WC & HS   thiamine  100 mg Oral Daily   Continuous Infusions:  feeding supplement (VITAL 1.5 CAL) 40 mL/hr at 12/23/21 0900   PRN Meds: loperamide, ondansetron, simethicone  Labs Reviewed  Diet Order:   Diet Order             Diet clear liquid Room service appropriate? Yes; Fluid consistency: Thin  Diet effective now                   EDUCATION NEEDS:   Education needs have been addressed  Skin:  Skin Assessment: Reviewed RN Assessment  Last BM:  8/16  Height:   Ht Readings from Last 1 Encounters:  12/06/21 '5\' 8"'$  (1.727 m)    Weight:   Wt Readings from Last 1 Encounters:  12/23/21 52.3 kg   BMI:  Body mass index is 17.53 kg/m.  Estimated Nutritional Needs:   Kcal:  2300-2500 kcals  Protein:  115-130 g  Fluid:  >/= 2 L   Ranell Patrick, RD, LDN Clinical Dietitian RD pager # available in [facility]  After hours/weekend pager #  available in [facility]

## 2021-12-23 NOTE — Progress Notes (Signed)
Pt has been requesting and consuming soda and juice throughout the night. Pt states he does not have pain when he is drinking fluids.

## 2021-12-23 NOTE — Progress Notes (Signed)
Physical Therapy Treatment Patient Details Name: [name] MRN: [id number] DOB: 11-16-1970 Today's Date: 12/23/2021   History of Present Illness 51 year old [name] admitted 7/30  with abdominal pain diagnosed with acute on chronic recurrent alcoholic pancreatitis. PMH:  WPW s/p ablation, substance use, ongoing alcohol abuse, alcoholic pancreatitis, HTN, chronic pain (followed up by the pain physician and was recently on Suboxone) and seizure disorder.    PT Comments    Pt received supine and agreeable to session with continued progress. Session focus on balance and gait with pt able to accept moderate balance challenges with mild LOB and pt able to self correct in all instances. Pt able to change direction quickly froward<>retro<>forward with slight change in gait velocity and without LOB. Pt with mild LOB but able to self correct with good stepping strategies with catching and tossing towel roll and turning torso to pass to this PTA during gait. Continued education re; activity recommendations and benefits of continued mobility with pt verbalizing understanding. Pt continues to benefit from skilled PT services to progress toward functional mobility goals.   HR 92-105bpm throughout.    Recommendations for follow up therapy are one component of a multi-disciplinary discharge planning process, led by the attending physician.  Recommendations may be updated based on patient status, additional functional criteria and insurance authorization.  Follow Up Recommendations  No PT follow up     Assistance Recommended at Discharge PRN  Patient can return home with the following Help with stairs or ramp for entrance   Equipment Recommendations  Cane;Other (comment) Surveyor, minerals)    Recommendations for Other Services       Precautions / Restrictions Precautions Precautions: Fall Restrictions Weight Bearing Restrictions: No     Mobility  Bed Mobility Overal bed  mobility: Independent                  Transfers Overall transfer level: Independent                      Ambulation/Gait Ambulation/Gait assistance: Min guard Gait Distance (Feet): 1200 Feet Assistive device: None Gait Pattern/deviations: WFL(Within Functional Limits) Gait velocity: WNL     General Gait Details: no LOB with gait, some weaving L/R, able to accept moderate balance challenges with mild LOB withpt able to self correct in all instances with stepping strategies, HR 90s throughout   Stairs             Wheelchair Mobility    Modified Rankin (Stroke Patients Only)       Balance Overall balance assessment: Needs assistance Sitting-balance support: No upper extremity supported, Feet supported Sitting balance-Leahy Scale: Good     Standing balance support: No upper extremity supported, During functional activity Standing balance-Leahy Scale: Good                              Cognition Arousal/Alertness: Awake/alert Behavior During Therapy: WFL for tasks assessed/performed Overall Cognitive Status: Within Functional Limits for tasks assessed                                          Exercises Other Exercises Other Exercises: gait with head tunrs in all planes, gait with reaching for ball over shoulder and handing back around body, catching ball during gait, gait forward with quick 180 degreeturn retro stepping 180 degree  turn repeating with no interuption in gait speed    General Comments        Pertinent Vitals/Pain Pain Assessment Pain Assessment: Faces Faces Pain Scale: Hurts a little bit Pain Location: abdomen Pain Descriptors / Indicators: Sore Pain Intervention(s): Monitored during session, Limited activity within patient's tolerance    Home Living                          Prior Function            PT Goals (current goals can now be found in the care plan section) Acute Rehab PT  Goals Patient Stated Goal: to get scooter to go to grocery store PT Goal Formulation: With patient Time For Goal Achievement: 01/01/22    Frequency    Min 2X/week      PT Plan      Co-evaluation              AM-PAC PT "6 Clicks" Mobility   Outcome Measure  Help needed turning from your back to your side while in a flat bed without using bedrails?: None Help needed moving from lying on your back to sitting on the side of a flat bed without using bedrails?: None Help needed moving to and from a bed to a chair (including a wheelchair)?: None Help needed standing up from a chair using your arms (e.g., wheelchair or bedside chair)?: None Help needed to walk in hospital room?: A Little Help needed climbing 3-5 steps with a railing? : A Little 6 Click Score: 22    End of Session Equipment Utilized During Treatment: Gait belt Activity Tolerance: Patient tolerated treatment well;Patient limited by fatigue Patient left: with call bell/phone within reach;in chair Nurse Communication: Mobility status PT Visit Diagnosis: Muscle weakness (generalized) (M62.81)     Time: 5284-1324 PT Time Calculation (min) (ACUTE ONLY): 18 min  Charges:  $Therapeutic Exercise: 8-22 mins                     [name] R. PTA Acute Rehabilitation Services Office: [place] 12/23/2021, 11:41 AM

## 2021-12-23 NOTE — Progress Notes (Signed)
PROGRESS NOTE                                                                                                                                                                                                             Patient Demographics:    [name], is a 51 y.o. male, DOB - 1971/04/27, IDP:[id number]  Outpatient Primary MD for the patient is Pcp, No    LOS - 19  Admit date - 12/05/2021    Chief Complaint  Patient presents with   Multiple Complaints        Brief Narrative (HPI from H&P)   51 year old [name] with past medical history significant for WPW s/p ablation, substance use, ongoing alcohol abuse, alcoholic pancreatitis, HTN, chronic pain (followed up by the pain physician and was recently on Suboxone) and seizure disorder.  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   Subjective:   Patient in bed, appears comfortable, denies any headache, no fever, no chest pain or pressure, no shortness of breath , much improved abdominal pain. No new focal weakness.   Assessment  & Plan :    Acute on chronic alcoholic pancreatitis in a patient with ongoing alcohol abuse - he been seen by GI, is being treated with conservative measures. Counseled strictly to quit alcohol.  No signs of DTs.  He is more than 2 weeks out of his last alcohol abuse due to being in the hospital for 2 weeks.  He had a core track placed which is in his duodenum and was fed with it and kept n.p.o. for several days he showed improvement, was rechallenged with oral diet on 12/20/2021 and now claims that his pain has gotten worse, make n.p.o. by mouth except medications resume core track tube feeds.   He is now finally much improved, tolerating clear liquid diet although he was n.p.o., was asking for more and more juices  throughout the night on 12/23/2021, now says his pain is much better, will clamp the NG tube, challenge with clear liquid diet, continue to titrate down narcotics.  Likely discharge in the next 1 to 2 days.  Again there is a strong suspicion for narcotic seeking behavior, it has been noticed by multiple medical staff attending to the patient.  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  S/p ablation per patient.  Does have episodes of RVR with aberrant conduction, has been on high-dose beta-blocker, seen by cardiology this admission, weight stable.  Continue to monitor.  His [place] vas 2 score is around 1.  No anticoagulation per cardiology.  Pulmonary nodule - 3 mm left upper lobe nodule incidental finding on CTA-recommend 1 year follow-up  Alcohol use disorder, severe, dependence ([place]) - Reports normally checking 6-12 beers daily.  Last had reportedly 1 beer  About 2 days ago.Placed on CIWA protocol  Essential hypertension - Controlled. On Blocker and Norvasc.  Monitor.  Hypomagnesemia.  Replaced.       Condition - Extremely Guarded  Family Communication  :  None present  Code Status :  Full  Consults  :  GI  PUD Prophylaxis : PPI   Procedures  :            Disposition Plan  :    Status is: Inpatient  DVT Prophylaxis  :    enoxaparin (LOVENOX) injection 40 mg Start: 12/06/21 2200   Lab Results  Component Value Date   PLT 253 12/23/2021    Diet :  Diet Order             Diet clear liquid Room service appropriate? Yes; Fluid consistency: Thin  Diet effective now                    Inpatient Medications  Scheduled Meds:  amLODipine  10 mg Oral Daily   clonazePAM  0.5 mg Oral BID   clotrimazole   Topical BID   DULoxetine  30 mg Oral Daily   enoxaparin (LOVENOX) injection  40 mg Subcutaneous Q24H   feeding supplement  1 Container Oral TID WC   folic acid  1 mg Oral Daily   lipase/protease/amylase  72,000 Units  Oral TID   metoprolol succinate  100 mg Oral Daily   multivitamin with minerals  1 tablet Oral Daily   nicotine  21 mg Transdermal QHS   mouth rinse  15 mL Mouth Rinse 4 times per day   pantoprazole  40 mg Oral BID   sucralfate  1 g Oral TID WC & HS   thiamine  100 mg Oral Daily   Continuous Infusions:  feeding supplement (VITAL 1.5 CAL) 1,000 mL (12/23/21 0239)   PRN Meds:.acetaminophen, hydrALAZINE, HYDROcodone-acetaminophen, lidocaine, loperamide, morphine injection, naLOXone (NARCAN)  injection, ondansetron (ZOFRAN) IV, mouth rinse, simethicone  Antibiotics  :    Anti-infectives (From admission, onward)    None        Time Spent in minutes  30   Lala Lund M.D on 12/23/2021 at 8:42 AM  To page go to [email or web address]   [facility] -  Office  [phone]  See all Orders from today for further details    Objective:   Vitals:   12/22/21 2338 12/23/21 0329 12/23/21 0555 12/23/21 0714  BP: 126/72 121/74  98/76  Pulse: 88 72    Resp: '20 16  20  '$ Temp: 98.5 F (36.9 C) 98.7 F (37.1 C)  98.7 F (37.1 C)  TempSrc: Oral Oral  Oral  SpO2: 94% 95%  97%  Weight:   52.3 kg   Height:        Wt Readings from Last 3 Encounters:  12/23/21 52.3 kg  11/29/21 61 kg  11/02/21 61.7 kg     Intake/Output Summary (Last 24  hours) at 12/23/2021 0842 Last data filed at 12/23/2021 0555 Gross per 24 hour  Intake 1847.83 ml  Output 575 ml  Net 1272.83 ml     Physical Exam  Awake Alert, No new F.N deficits, core track NG tube in place, left arm midline in place [place].AT,PERRAL Supple Neck, No JVD,   Symmetrical Chest wall movement, Good air movement bilaterally, CTAB RRR,No Gallops, Rubs or new Murmurs,  +ve B.Sounds, Abd Soft, No tenderness,   No Cyanosis, Clubbing or edema    Data Review:    CBC Recent Labs  Lab 12/19/21 0021 12/20/21 0151 12/21/21 0110 12/22/21 0105 12/23/21 0041  WBC 6.8 7.9 9.4 9.5 10.1  HGB 8.7* 8.9* 9.5* 9.8* 9.4*  HCT 27.0*  27.9* 29.4* 30.6* 29.3*  PLT 158 163 219 241 253  MCV 79.9* 80.4 80.8 81.8 81.2  MCH 25.7* 25.6* 26.1 26.2 26.0  MCHC 32.2 31.9 32.3 32.0 32.1  RDW 21.4* 22.0* 22.3* 22.4* 22.2*  LYMPHSABS 2.8 2.3 2.0 2.0 3.0  MONOABS 0.3 0.9 0.8 1.0 0.8  EOSABS 0.3 0.4 0.3 0.2 0.0  BASOSABS 0.1 0.0 0.0 0.0 0.1    Electrolytes Recent Labs  Lab 12/18/21 0744 12/19/21 0021 12/20/21 0151 12/21/21 0110 12/22/21 0105 12/23/21 0041  NA 135 135 135 135 134* 135  K 4.4 4.1 4.2 4.0 4.0 3.7  CL 102 102 103 103 102 103  CO2 '26 26 25 23 22 23  '$ GLUCOSE 109* 136* 112* 95 120* 141*  BUN '7 8 11 10 12 12  '$ CREATININE 0.71 0.72 0.74 0.71 0.83 0.74  CALCIUM 9.0 8.7* 9.0 9.8 9.7 9.5  AST '18 15 17 15 26 27  '$ ALT '12 11 10 12 13 20  '$ ALKPHOS 65 60 56 61 61 60  BILITOT 0.3 0.3 0.2* 0.6 0.9 0.6  ALBUMIN 3.2* 2.9* 3.0* 3.3* 3.7 3.5  MG 1.2* 2.0 1.5* 2.1 1.4*  --      Radiology Reports MR ABDOMEN MRCP W WO CONTAST  Result Date: 12/22/2021 CLINICAL DATA:  Uncooperative patient. Best images possible. Concern for recurrent pancreatitis. Chronic pancreatitis history. EXAM: MRI ABDOMEN WITHOUT AND WITH CONTRAST (INCLUDING MRCP) TECHNIQUE: Multiplanar multisequence MR imaging of the abdomen was performed both before and after the administration of intravenous contrast. Heavily T2-weighted images of the biliary and pancreatic ducts were obtained, and three-dimensional MRCP images were rendered by post processing. CONTRAST:  5m GADAVIST GADOBUTROL 1 MMOL/ML IV SOLN COMPARISON:  CT 12/11/2021, MRI 11/22/2019 . FINDINGS: Lower chest:  Lung bases are clear. Hepatobiliary: No hepatic steatosis. No intrahepatic or extrahepatic biliary duct dilatation. Common bile duct measures 5 mm within normal limits. No gallstones evident within the gallbladder. Postcontrast enhanced T1 weighted imaging demonstrates no enhancing hepatic lesion. Pancreas: No variant ductal pancreatic anatomy. There is a unilocular cyst in the tail the pancreas  measuring 1.8 by 1.6 cm. This is reduced in volume from MRI 2021 but similar measuring 2.1 x 2.7 cm at that time. There is ductal ectasia in the tail the pancreas improved from cyst on comparison exam. towards the head of the pancreas there is a small cyst measuring 5 mm (image 20/3. Decreased from a 18 mm cyst on comparison MRI. There is no pancreatic duct dilatation. No clear acute inflammation involving the pancreas. No pancreatic edema. Postcontrast images demonstrate enhancement of the pancreatic parenchyma. No edema or acute fluid collections identified. The postcontrast T1 weighted imaging is somewhat degraded by respiratory motion patient motion. Spleen: Normal spleen. Adrenals/urinary tract: Adrenal glands and kidneys  are normal. Stomach/Bowel: Stomach and limited of the small bowel is unremarkable Vascular/Lymphatic: Abdominal aortic normal caliber. No retroperitoneal periportal lymphadenopathy. Musculoskeletal: No aggressive osseous lesion IMPRESSION: 1. No evidence acute pancreatitis. Chronic cysts within the pancreatic body are improved from MRI 2021. No new fluid collections. 2. Normal biliary tree.  No cholelithiasis. 3. No enhancing hepatic lesion. 4. Some limitation of exam due to patient being uncooperative. Electronically Signed   By: Suzy Bouchard M.D.   On: 12/22/2021 17:35   DG Abd Portable 1V  Result Date: 12/21/2021 CLINICAL DATA:  Nausea and vomiting EXAM: PORTABLE ABDOMEN - 1 VIEW COMPARISON:  None Available. FINDINGS: The bowel gas pattern is normal. No radio-opaque calculi or other significant radiographic abnormality are seen. IMPRESSION: Unremarkable bowel gas pattern. Electronically Signed   By: Macy Mis M.D.   On: 12/21/2021 12:42

## 2021-12-23 NOTE — Progress Notes (Signed)
Mobility Specialist Progress Note    12/23/21 1554  Mobility  Activity Ambulated independently in hallway  Level of Assistance Independent  Assistive Device None  Distance Ambulated (ft) 800 ft  Activity Response Tolerated well  $Mobility charge 1 Mobility   Pt received in doorway and agreeable. No complaints on walk. Returned to sitting EOB with call bell in reach.    Hildred Alamin Mobility Specialist

## 2021-12-24 ENCOUNTER — Other Ambulatory Visit (HOSPITAL_COMMUNITY): Payer: Self-pay

## 2021-12-24 DIAGNOSIS — I480 Paroxysmal atrial fibrillation: Secondary | ICD-10-CM | POA: Diagnosis not present

## 2021-12-24 LAB — GLUCOSE, CAPILLARY
Glucose-Capillary: 118 mg/dL — ABNORMAL HIGH (ref 70–99)
Glucose-Capillary: 169 mg/dL — ABNORMAL HIGH (ref 70–99)

## 2021-12-24 MED ORDER — HYDROCODONE-ACETAMINOPHEN 5-325 MG PO TABS
1.0000 | ORAL_TABLET | Freq: Two times a day (BID) | ORAL | 0 refills | Status: DC | PRN
  Filled 2021-12-24: qty 15, 7d supply, fill #0

## 2021-12-24 MED ORDER — LIDOCAINE 4 % EX CREA
TOPICAL_CREAM | Freq: Four times a day (QID) | CUTANEOUS | 0 refills | Status: DC | PRN
  Filled 2021-12-24: qty 30, fill #0

## 2021-12-24 MED ORDER — METOPROLOL SUCCINATE ER 100 MG PO TB24
100.0000 mg | ORAL_TABLET | Freq: Every day | ORAL | 0 refills | Status: DC
  Filled 2021-12-24: qty 30, 30d supply, fill #0

## 2021-12-24 MED ORDER — PANCRELIPASE (LIP-PROT-AMYL) 36000-114000 UNITS PO CPEP
72000.0000 [IU] | ORAL_CAPSULE | Freq: Three times a day (TID) | ORAL | 0 refills | Status: DC
  Filled 2021-12-24: qty 180, 30d supply, fill #0

## 2021-12-24 MED ORDER — NICOTINE 21 MG/24HR TD PT24
21.0000 mg | MEDICATED_PATCH | Freq: Every day | TRANSDERMAL | 0 refills | Status: DC
  Filled 2021-12-24: qty 28, 28d supply, fill #0

## 2021-12-24 MED ORDER — ORAL CARE MOUTH RINSE: 15.0000 mL | OROMUCOSAL | Status: DC | PRN

## 2021-12-24 MED ORDER — AMLODIPINE BESYLATE 10 MG PO TABS
10.0000 mg | ORAL_TABLET | Freq: Every day | ORAL | 0 refills | Status: DC
  Filled 2021-12-24: qty 30, 30d supply, fill #0

## 2021-12-24 MED ORDER — PANTOPRAZOLE SODIUM 40 MG PO TBEC
40.0000 mg | DELAYED_RELEASE_TABLET | Freq: Two times a day (BID) | ORAL | 0 refills | Status: DC
  Filled 2021-12-24: qty 30, 15d supply, fill #0

## 2021-12-24 NOTE — Discharge Summary (Signed)
[name] EUM:[id number] DOB: [birth year] DOA: 12/05/2021  PCP: Pcp, No  Admit date: 12/05/2021  Discharge date: 12/24/2021  Admitted From: Home   Disposition:  Home   Recommendations for Outpatient Follow-up:   Follow up with PCP in 1-2 weeks  PCP Please obtain BMP/CBC, 2 view CXR in 1week,  (see Discharge instructions)   PCP Please follow up on the following pending results:     Home Health: None   Equipment/Devices: None  Consultations: GI Discharge Condition: Stable    CODE STATUS: Full    Diet Recommendation: Heart Healthy Low Fat    Chief Complaint  Patient presents with   Multiple Complaints      Brief history of present illness from the day of admission and additional interim summary    51 year old [name] with past medical history significant for WPW s/p ablation, substance use, ongoing alcohol abuse, alcoholic pancreatitis, HTN, chronic pain (followed up by the pain physician and was recently on Suboxone) and seizure disorder.  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  Strong suspicion of narcotic seeking behavior as well.                                                                 Hospital Course   Acute on chronic alcoholic pancreatitis in a patient with ongoing alcohol abuse - he been seen by GI, is being treated with conservative measures. Counseled strictly to quit alcohol.  No signs of DTs.  He is more than 2 weeks out of his last alcohol abuse due to being in the hospital for 2 weeks.    He required core track placement to his mid duodenum for tube feeding for a few days, he is now completely symptom-free, of note there is a strong suspicion of  narcotic seeking behavior as noticed by multiple staff members attending to the patient, symptoms were quite inconsistent at times.     Due to his acute pancreatitis he did require some use of narcotics in the hospital, he is now much better request few days of narcotics till he can see his pain management MD in a few days.  He has been strictly counseled to quit alcohol.   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  S/p ablation per patient.  Does have episodes of RVR with aberrant conduction, has been on high-dose beta-blocker, seen by cardiology this admission, stable now. Continue to monitor.  His [place] vas 2 score is around 1.  No anticoagulation per cardiology.   Pulmonary nodule - 3 mm left upper lobe nodule incidental finding on CTA-recommend 1 year follow-up.  Follow-up with your PCP.  Written instructions provided to the patient  as below.   Alcohol use disorder, severe, dependence ([place]) - Reports normally checking 6-12 beers daily.  Last had reportedly 1 beer  About 2 days ago.Placed on CIWA protocol   Essential hypertension - Controlled. On Blocker and Norvasc.  Monitor.   Hypomagnesemia.  Replaced.  Discharge diagnosis     Principal Problem:   Paroxysmal atrial fibrillation with RVR (HCC) Active Problems:   Tobacco abuse   Pancreatic pseudocyst/cyst   Essential hypertension   Alcohol use disorder, severe, dependence (HCC)   Normocytic anemia   Gastroesophageal reflux disease   Diarrhea   Abdominal pain   Acute on chronic pancreatitis (HCC)   AKI (acute kidney injury) (HCC)   Hypomagnesemia   Acute alcoholic pancreatitis   Leukocytosis   WPW (Wolff-Parkinson-White syndrome)   Pulmonary nodule   Acute recurrent pancreatitis   Normal anion gap metabolic acidosis   Hypoalbuminemia    Discharge instructions    Discharge Instructions     Discharge instructions   Complete by: As directed    Follow with Primary MD in 7 days,  you need a CT scan of your chest within 6 to 12 months to monitor a lung nodule.  Follow-up with your PCP and gastroenterologist in a week.  Get CBC, CMP, 2 view Chest X ray -  checked next visit within 1 week by Primary MD    Activity: As tolerated with Full fall precautions use walker/cane & assistance as needed  Disposition Home    Diet: Heart Healthy  - Low Fat diet  Special Instructions: If you have smoked or chewed Tobacco  in the last 2 yrs please stop smoking, stop any regular Alcohol  and or any Recreational drug use.  On your next visit with your primary care physician please Get Medicines reviewed and adjusted.  Please request your Prim.MD to go over all Hospital Tests and Procedure/Radiological results at the follow up, please get all Hospital records sent to your Prim MD by signing hospital release before you go home.  If you experience worsening of your admission symptoms, develop shortness of breath, life threatening emergency, suicidal or homicidal thoughts you must seek medical attention immediately by calling 911 or calling your MD immediately  if symptoms less severe.  You Must read complete instructions/literature along with all the possible adverse reactions/side effects for all the Medicines you take and that have been prescribed to you. Take any new Medicines after you have completely understood and accpet all the possible adverse reactions/side effects.   Increase activity slowly   Complete by: As directed        Discharge Medications   Allergies as of 12/24/2021       Reactions   Robaxin [methocarbamol] Other (See Comments)   "jumpy limbs"   Aspirin Other (See Comments)   Unknown reaction   Shellfish-derived Products Nausea And Vomiting, Rash   Trazodone And Nefazodone Other (See Comments)   Muscle spasms   Adhesive [tape] Itching   Latex Itching   Toradol [ketorolac Tromethamine] Other (See Comments)   Has ulcers; cannot have this   Contrast Media  [iodinated Contrast Media] Hives   Reglan [metoclopramide] Other (See Comments)   Muscle spasms   Salmon [fish Oil] Nausea And Vomiting, Rash        Medication List     STOP taking these medications    omeprazole 40 MG capsule Commonly known as: PRILOSEC Replaced by: pantoprazole 40 MG tablet   ondansetron 4 MG disintegrating tablet Commonly known as: ZOFRAN-ODT  promethazine 25 MG tablet Commonly known as: PHENERGAN   verapamil 120 MG CR tablet Commonly known as: CALAN-SR       TAKE these medications    acetaminophen 500 MG tablet Commonly known as: TYLENOL Take 500 mg by mouth every 6 (six) hours as needed for moderate pain or headache.   amLODipine 10 MG tablet Commonly known as: NORVASC Take 1 tablet (10 mg total) by mouth daily. Start taking on: December 26, 8142   folic acid 1 MG tablet Commonly known as: FOLVITE Take 1 tablet (1 mg total) by mouth daily.   HYDROcodone-acetaminophen 5-325 MG tablet Commonly known as: NORCO/VICODIN Take 1 tablet by mouth every 12 (twelve) hours as needed for moderate pain.   lidocaine 4 % cream Commonly known as: LMX Apply topically 4 (four) times daily as needed (NGT pain).   lipase/protease/amylase 36000 UNITS Cpep capsule Commonly known as: Creon Take 2 capsules (72,000 Units total) by mouth 3 (three) times daily with meals.   magnesium oxide 400 (240 Mg) MG tablet Commonly known as: MAG-OX Take 1 tablet (400 mg total) by mouth daily.   metoprolol succinate 100 MG 24 hr tablet Commonly known as: TOPROL-XL Take 1 tablet (100 mg total) by mouth daily. Take with or immediately following a meal. Start taking on: December 25, 2021 What changed:  medication strength additional instructions   multivitamin with minerals Tabs tablet Take 1 tablet by mouth daily.   nicotine 21 mg/24hr patch Commonly known as: NICODERM CQ - dosed in mg/24 hours Place 1 patch (21 mg total) onto the skin at bedtime.   ondansetron 4  MG tablet Commonly known as: ZOFRAN Take 1 tablet (4 mg total) by mouth every 6 (six) hours.   pantoprazole 40 MG tablet Commonly known as: PROTONIX Take 1 tablet (40 mg total) by mouth 2 (two) times daily. Replaces: omeprazole 40 MG capsule   sucralfate 1 g tablet Commonly known as: Carafate Take 1 tablet (1 g total) by mouth 4 (four) times daily -  with meals and at bedtime.   SYSTANE OP Place 1 drop into both eyes 4 (four) times daily as needed (for dryness).   thiamine 100 MG tablet Commonly known as: VITAMIN B1 Take 1 tablet (100 mg total) by mouth daily.   Ventolin HFA 108 (90 Base) MCG/ACT inhaler Generic drug: albuterol Inhale 2 puffs into the lungs every 4 (four) hours as needed for wheezing or shortness of breath.   Vitamin D (Ergocalciferol) 1.25 MG (50000 UNIT) Caps capsule Commonly known as: DRISDOL Take 50,000 Units by mouth every Wednesday.               Durable Medical Equipment  (From admission, onward)           Start     Ordered   12/18/21 1608  For home use only DME Cane  Once       Comments: single   12/18/21 1610             Follow-up Information     Minus Breeding, MD Follow up on 01/03/2022.   Specialty: Cardiology Why: 8:20AM. Cardiology follow up Contact information: [place]  [address] [phone]         PCP. Schedule an appointment as soon as possible for a visit in 1 week(s).          Brand Males, MD. Schedule an appointment as soon as possible for a visit in 3 month(s).   Specialty: Pulmonary  Disease Why: Lung nodule follow-up Contact information: [place] [address] [phone]                 Major procedures and Radiology Reports - PLEASE review detailed and final reports thoroughly  -        MR ABDOMEN MRCP W WO CONTAST  Result Date: 12/22/2021 CLINICAL DATA:  Uncooperative patient. Best images possible. Concern for  recurrent pancreatitis. Chronic pancreatitis history. EXAM: MRI ABDOMEN WITHOUT AND WITH CONTRAST (INCLUDING MRCP) TECHNIQUE: Multiplanar multisequence MR imaging of the abdomen was performed both before and after the administration of intravenous contrast. Heavily T2-weighted images of the biliary and pancreatic ducts were obtained, and three-dimensional MRCP images were rendered by post processing. CONTRAST:  15m GADAVIST GADOBUTROL 1 MMOL/ML IV SOLN COMPARISON:  CT 12/11/2021, MRI 11/22/2019 . FINDINGS: Lower chest:  Lung bases are clear. Hepatobiliary: No hepatic steatosis. No intrahepatic or extrahepatic biliary duct dilatation. Common bile duct measures 5 mm within normal limits. No gallstones evident within the gallbladder. Postcontrast enhanced T1 weighted imaging demonstrates no enhancing hepatic lesion. Pancreas: No variant ductal pancreatic anatomy. There is a unilocular cyst in the tail the pancreas measuring 1.8 by 1.6 cm. This is reduced in volume from MRI 2021 but similar measuring 2.1 x 2.7 cm at that time. There is ductal ectasia in the tail the pancreas improved from cyst on comparison exam. towards the head of the pancreas there is a small cyst measuring 5 mm (image 20/3. Decreased from a 18 mm cyst on comparison MRI. There is no pancreatic duct dilatation. No clear acute inflammation involving the pancreas. No pancreatic edema. Postcontrast images demonstrate enhancement of the pancreatic parenchyma. No edema or acute fluid collections identified. The postcontrast T1 weighted imaging is somewhat degraded by respiratory motion patient motion. Spleen: Normal spleen. Adrenals/urinary tract: Adrenal glands and kidneys are normal. Stomach/Bowel: Stomach and limited of the small bowel is unremarkable Vascular/Lymphatic: Abdominal aortic normal caliber. No retroperitoneal periportal lymphadenopathy. Musculoskeletal: No aggressive osseous lesion IMPRESSION: 1. No evidence acute pancreatitis. Chronic  cysts within the pancreatic body are improved from MRI 2021. No new fluid collections. 2. Normal biliary tree.  No cholelithiasis. 3. No enhancing hepatic lesion. 4. Some limitation of exam due to patient being uncooperative. Electronically Signed   By: SSuzy BouchardM.D.   On: 12/22/2021 17:35   DG Abd Portable 1V  Result Date: 12/21/2021 CLINICAL DATA:  Nausea and vomiting EXAM: PORTABLE ABDOMEN - 1 VIEW COMPARISON:  None Available. FINDINGS: The bowel gas pattern is normal. No radio-opaque calculi or other significant radiographic abnormality are seen. IMPRESSION: Unremarkable bowel gas pattern. Electronically Signed   By: PMacy MisM.D.   On: 12/21/2021 12:42   DG Abd Portable 1V  Result Date: 12/18/2021 CLINICAL DATA:  Abdominal pain EXAM: PORTABLE ABDOMEN - 1 VIEW COMPARISON:  12/13/2021 FINDINGS: Tip of enteric tube is seen in the region of fourth portion of duodenum. Bowel gas pattern is nonspecific. No abnormal masses or calcifications are seen. Arterial calcifications are seen. IMPRESSION: Nonspecific bowel gas pattern. Tip of enteric tube is seen in the region of fourth portion of duodenum. Electronically Signed   By: PElmer PickerM.D.   On: 12/18/2021 13:07   DG Abd Portable 1V  Result Date: 12/13/2021 CLINICAL DATA:  Enteric catheter placement EXAM: PORTABLE ABDOMEN - 1 VIEW COMPARISON:  12/13/2021 at 4:05 p.m. FINDINGS: Semi-erect frontal view of the lower chest and upper abdomen demonstrates enteric catheter unchanged since prior  study, tip in the region of the ligament of Treitz. Bowel gas pattern is unremarkable. Mild interstitial prominence at the lung bases unchanged. IMPRESSION: 1. Enteric catheter tip projecting in the region of the duodenal jejunal junction. Electronically Signed   By: Randa Ngo M.D.   On: 12/13/2021 18:25   DG Abd Portable 1V  Result Date: 12/13/2021 CLINICAL DATA:  Feeding tube placement. EXAM: PORTABLE ABDOMEN - 1 VIEW COMPARISON:  Chest  radiograph December 05, 2021 FINDINGS: Feeding tube is demonstrated coursing inferior to the diaphragm projecting at the level of the DJJ. Additionally, there is a second linear tube projecting along the right aspect of the mediastinum, potentially the proximal aspect of the feeding tube however nonspecific. Recommend clinical correlation. Monitoring leads overlie the patient. Normal cardiac and mediastinal contours. Bilateral interstitial pulmonary opacities. No pleural effusion or pneumothorax. IMPRESSION: Feeding tube tip projects at the level of the DJJ. There is a second linear density along the right aspect of the mediastinum which may represent the proximal aspect of the tube external to the patient. Recommend clinical correlation. Coarse bilateral interstitial opacities which may represent edema in the appropriate clinical setting. Electronically Signed   By: Lovey Newcomer M.D.   On: 12/13/2021 16:17   CT ABDOMEN PELVIS WO CONTRAST  Result Date: 12/11/2021 CLINICAL DATA:  Pancreatitis, acute, severe EXAM: CT ABDOMEN AND PELVIS WITHOUT CONTRAST TECHNIQUE: Multidetector CT imaging of the abdomen and pelvis was performed following the standard protocol without IV contrast. RADIATION DOSE REDUCTION: This exam was performed according to the departmental dose-optimization program which includes automated exposure control, adjustment of the mA and/or kV according to patient size and/or use of iterative reconstruction technique. COMPARISON:  Noncontrast CT 11/02/2021 FINDINGS: Lower chest: No pleural effusion or confluent airspace disease. The heart is normal in size. Hepatobiliary: No focal hepatic abnormality on this unenhanced exam. Gallbladder physiologically distended, no calcified stone. No biliary dilatation. Pancreas: Diffuse coarse pancreatic calcifications, similar to prior exam. Low-density lesion in the pancreatic tail measures 15 mm, slightly smaller than on prior exam when it measured 22 mm. Cystic lesion  at the pancreatic tail adjacent to the splenic hilum measures 13 mm, previously 14 mm. These likely represent pseudocysts. There is no evidence of new pancreatic collection. Slight indistinctness of peripancreatic fat planes may represent acute pancreatitis. This is mild. No definite pancreatic ductal dilatation. Spleen: Normal in size without focal abnormality. Adrenals/Urinary Tract: No adrenal nodule. No hydronephrosis or renal calculi. No evidence of focal renal lesion on this unenhanced exam. Physiologically distended urinary bladder. Stomach/Bowel: Diffuse gastric thickening is unchanged from prior exam. There is no small bowel obstruction. No small bowel inflammation, although lack of contrast and paucity of intra-abdominal fat limits detailed bowel assessment. Normal appendix courses into the right upper quadrant. Small volume of formed stool throughout the colon. Minor left colonic diverticulosis. No diverticulitis. Vascular/Lymphatic: Aortic atherosclerosis. No aortic aneurysm. No bulky abdominopelvic adenopathy. Reproductive: Prostate is unremarkable. Other: Trace perisplenic free fluid. Trace pericolic gutter free fluid in the right. Trace free fluid in the pelvis. No free air. Small fat containing umbilical hernia. Musculoskeletal: L5-S1 degenerative disc disease. There are no acute or suspicious osseous abnormalities. IMPRESSION: 1. Slight indistinctness of peripancreatic fat planes may represent acute pancreatitis. 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 No evidence of new pancreatic collection. 2. Sequela of chronic pancreatitis with pancreatic calcifications. 3. Chronic gastric thickening, unchanged from prior  exam. 4. Minimal left colonic diverticulosis without diverticulitis. Aortic Atherosclerosis (ICD10-I70.0). Electronically Signed   By: Keith Rake M.D.   On:  12/11/2021 19:45   ECHOCARDIOGRAM COMPLETE  Result Date: 12/07/2021    ECHOCARDIOGRAM REPORT   Patient Name:   [facility] Date of Exam: 12/07/2021 Medical Rec #:  [id number]              Height:       68.0 in Accession #:    [id number]             Weight:       137.3 lb Date of Birth:  02/26/1971               BSA:          1.742 m Patient Age:    51 years               BP:           116/77 mmHg Patient Gender: M                      HR:           68 bpm. Exam Location:  Inpatient Procedure: 2D Echo, Cardiac Doppler and Color Doppler Indications:    Atrial fibrillation  History:        Patient has prior history of Echocardiogram examinations, most                 recent 08/26/2019. Risk Factors:Hypertension.  Sonographer:    Jefferey Pica Referring Phys: [place]  1. Left ventricular ejection fraction, by estimation, is 65 to 70%. The left ventricle has normal function. The left ventricle has no regional wall motion abnormalities. Left ventricular diastolic parameters are consistent with Grade I diastolic dysfunction (impaired relaxation).  2. Right ventricular systolic function is normal. The right ventricular size is normal. There is normal pulmonary artery systolic pressure.  3. The mitral valve is normal in structure. No evidence of mitral valve regurgitation. No evidence of mitral stenosis.  4. The aortic valve is tricuspid. Aortic valve regurgitation is not visualized. No aortic stenosis is present.  5. The inferior vena cava is normal in size with greater than 50% respiratory variability, suggesting right atrial pressure of 3 mmHg. Comparison(s): No significant change from prior study. FINDINGS  Left Ventricle: Left ventricular ejection fraction, by estimation, is 65 to 70%. The left ventricle has normal function. The left ventricle has no regional wall motion abnormalities. The left ventricular internal cavity size was normal in size. There is  no left ventricular  hypertrophy. Left ventricular diastolic parameters are consistent with Grade I diastolic dysfunction (impaired relaxation). Right Ventricle: The right ventricular size is normal. No increase in right ventricular wall thickness. Right ventricular systolic function is normal. There is normal pulmonary artery systolic pressure. The tricuspid regurgitant velocity is 2.50 m/s, and  with an assumed right atrial pressure of 3 mmHg, the estimated right ventricular systolic pressure is 79.3 mmHg. Left Atrium: Left atrial size was normal in size. Right Atrium: Right atrial size was normal in size. Pericardium: Trivial pericardial effusion is present. Mitral Valve: The mitral valve is normal in structure. No evidence of mitral valve regurgitation. No evidence of mitral valve stenosis. Tricuspid Valve: The tricuspid valve is normal in structure. Tricuspid valve regurgitation is trivial. No evidence of tricuspid stenosis. Aortic Valve: The aortic valve is tricuspid. Aortic valve regurgitation is not visualized. No aortic  stenosis is present. Aortic valve peak gradient measures 6.9 mmHg. Pulmonic Valve: The pulmonic valve was normal in structure. Pulmonic valve regurgitation is not visualized. No evidence of pulmonic stenosis. Aorta: The aortic root, ascending aorta and aortic arch are all structurally normal, with no evidence of dilitation or obstruction. Venous: The inferior vena cava is normal in size with greater than 50% respiratory variability, suggesting right atrial pressure of 3 mmHg. IAS/Shunts: No atrial level shunt detected by color flow Doppler.  LEFT VENTRICLE PLAX 2D LVIDd:         4.40 cm   Diastology LVIDs:         2.20 cm   LV e' lateral:   11.20 cm/s LV PW:         1.10 cm   LV E/e' lateral: 7.4 LV IVS:        0.80 cm LVOT diam:     1.90 cm LV SV:         55 LV SV Index:   31 LVOT Area:     2.84 cm  IVC IVC diam: 1.70 cm LEFT ATRIUM             Index        RIGHT ATRIUM          Index LA diam:        3.30 cm  1.89 cm/m   RA Area:     9.17 cm LA Vol (A2C):   28.0 ml 16.07 ml/m  RA Volume:   17.70 ml 10.16 ml/m LA Vol (A4C):   37.3 ml 21.41 ml/m LA Biplane Vol: 32.4 ml 18.60 ml/m  AORTIC VALVE                 PULMONIC VALVE AV Area (Vmax): 2.45 cm     PV Vmax:       0.91 m/s AV Vmax:        131.75 cm/s  PV Peak grad:  3.3 mmHg AV Peak Grad:   6.9 mmHg LVOT Vmax:      113.67 cm/s LVOT Vmean:     65.533 cm/s LVOT VTI:       0.193 m  AORTA Ao Root diam: 2.50 cm Ao Asc diam:  2.50 cm MITRAL VALVE               TRICUSPID VALVE MV Area (PHT): 4.15 cm    TR Peak grad:   25.0 mmHg MV Decel Time: 183 msec    TR Vmax:        250.00 cm/s MV E velocity: 83.20 cm/s MV A velocity: 85.90 cm/s  SHUNTS MV E/A ratio:  0.97        Systemic VTI:  0.19 m                            Systemic Diam: 1.90 cm Rudean Haskell MD Electronically signed by Rudean Haskell MD Signature Date/Time: 12/07/2021/10:49:23 AM    Final    CT Angio Chest PE W and/or Wo Contrast  Result Date: 12/06/2021 CLINICAL DATA:  Concern for pulmonary embolus EXAM: CT ANGIOGRAPHY CHEST WITH CONTRAST TECHNIQUE: Multidetector CT imaging of the chest was performed using the standard protocol during bolus administration of intravenous contrast. Multiplanar CT image reconstructions and MIPs were obtained to evaluate the vascular anatomy. RADIATION DOSE REDUCTION: This exam was performed according to the departmental dose-optimization program which includes automated exposure control, adjustment of the mA and/or kV according  to patient size and/or use of iterative reconstruction technique. CONTRAST:  56m OMNIPAQUE IOHEXOL 350 MG/ML SOLN COMPARISON:  Abdomen and pelvis CT dated November 02, 2021 FINDINGS: Cardiovascular: Normal heart size. No pericardial effusion. Normal caliber thoracic aorta with mild noncalcified plaque. No evidence of pulmonary embolus. Mediastinum/Nodes: Esophagus and thyroid are unremarkable. No pathologically enlarged lymph nodes seen in the  chest. Lungs/Pleura: Central airways are patent. Moderate centrilobular emphysema. No consolidation, pleural effusion or pneumothorax. Small solid pulmonary nodule of the left upper lobe measuring 3 mm on series 8, image 60. Upper Abdomen: Cystic lesions of the pancreatic body and tail, unchanged when compared with recent abdomen and pelvis CT and likely pseudocysts. No acute abnormalities. Musculoskeletal: No chest wall abnormality. No acute or significant osseous findings. Review of the MIP images confirms the above findings. IMPRESSION: 1. No evidence of pulmonary embolus or acute airspace opacity. 2. Small solid pulmonary nodule of the left upper lobe measuring 3 mm. Recommend follow-up chest CT in 1 year if patient is high-risk.This recommendation follows the consensus statement: Guidelines for Management of Incidental Pulmonary Nodules Detected on CT Images: From the Fleischner Society 2017; Radiology 2017; 284:228-243. 3. Aortic Atherosclerosis (ICD10-I70.0) and Emphysema (ICD10-J43.9). Electronically Signed   By: LYetta GlassmanM.D.   On: 12/06/2021 13:21   DG Chest Port 1 View  Result Date: 12/05/2021 CLINICAL DATA:  Portable chest for chest pain. EXAM: PORTABLE CHEST 1 VIEW COMPARISON:  PA Lat 11/29/2021. FINDINGS: The heart size and mediastinal contours are within normal limits. Both lungs are clear. The visualized skeletal structures are unremarkable apart from osteopenia. There is a tangle of overlying monitor wiring. IMPRESSION: No active disease.  Stable chest.  Osteopenia. Electronically Signed   By: KTelford NabM.D.   On: 12/05/2021 23:00   DG Chest 2 View  Result Date: 11/29/2021 CLINICAL DATA:  Chest pain EXAM: CHEST - 2 VIEW COMPARISON:  11/02/2021 FINDINGS: Lungs are clear.  No pleural effusion or pneumothorax. The heart is normal in size. Visualized osseous structures are within normal limits. IMPRESSION: Normal chest radiographs. Electronically Signed   By: SJulian HyM.D.    On: 11/29/2021 19:11    Micro Results    No results found for this or any previous visit (from the past 240 hour(s)).  Today   Subjective    [name] has no headache,no chest abdominal pain,no new weakness tingling or numbness, feels much better wants to go home today.    Objective   Blood pressure (!) 129/91, pulse 85, temperature 98.2 F (36.8 C), temperature source Oral, resp. rate 19, height '5\' 8"'$  (1.727 m), weight 53.2 kg, SpO2 97 %.   Intake/Output Summary (Last 24 hours) at 12/24/2021 0915 Last data filed at 12/24/2021 0517 Gross per 24 hour  Intake 60 ml  Output 300 ml  Net -240 ml    Exam  Awake Alert, No new F.N deficits,    [place].AT,PERRAL Supple Neck,   Symmetrical Chest wall movement, Good air movement bilaterally, CTAB RRR,No Gallops,   +ve B.Sounds, Abd Soft, Non tender,  No Cyanosis, Clubbing or edema    Data Review   Recent Labs  Lab 12/19/21 0021 12/20/21 0151 12/21/21 0110 12/22/21 0105 12/23/21 0041  WBC 6.8 7.9 9.4 9.5 10.1  HGB 8.7* 8.9* 9.5* 9.8* 9.4*  HCT 27.0* 27.9* 29.4* 30.6* 29.3*  PLT 158 163 219 241 253  MCV 79.9* 80.4 80.8 81.8 81.2  MCH 25.7* 25.6* 26.1 26.2 26.0  MCHC 32.2 31.9 32.3 32.0  32.1  RDW 21.4* 22.0* 22.3* 22.4* 22.2*  LYMPHSABS 2.8 2.3 2.0 2.0 3.0  MONOABS 0.3 0.9 0.8 1.0 0.8  EOSABS 0.3 0.4 0.3 0.2 0.0  BASOSABS 0.1 0.0 0.0 0.0 0.1    Recent Labs  Lab 12/18/21 0744 12/19/21 0021 12/20/21 0151 12/21/21 0110 12/22/21 0105 12/23/21 0041  NA 135 135 135 135 134* 135  K 4.4 4.1 4.2 4.0 4.0 3.7  CL 102 102 103 103 102 103  CO2 '26 26 25 23 22 23  '$ GLUCOSE 109* 136* 112* 95 120* 141*  BUN '7 8 11 10 12 12  '$ CREATININE 0.71 0.72 0.74 0.71 0.83 0.74  CALCIUM 9.0 8.7* 9.0 9.8 9.7 9.5  AST '18 15 17 15 26 27  '$ ALT '12 11 10 12 13 20  '$ ALKPHOS 65 60 56 61 61 60  BILITOT 0.3 0.3 0.2* 0.6 0.9 0.6  ALBUMIN 3.2* 2.9* 3.0* 3.3* 3.7 3.5  MG 1.2* 2.0 1.5* 2.1 1.4*  --     Total Time in preparing paper work,  data evaluation and todays exam - 35 minutes  Lala Lund M.D on 12/24/2021 at 9:15 AM  [facility]

## 2021-12-24 NOTE — Discharge Instructions (Addendum)
Follow with Primary MD in 7 days, you need a CT scan of your chest within 6 to 12 months to monitor a lung nodule.  Follow-up with your PCP and gastroenterologist in a week.  Get CBC, CMP, 2 view Chest X ray -  checked next visit within 1 week by Primary MD    Activity: As tolerated with Full fall precautions use walker/cane & assistance as needed  Disposition Home    Diet: Heart Healthy  - Low Fat diet  Special Instructions: If you have smoked or chewed Tobacco  in the last 2 yrs please stop smoking, stop any regular Alcohol  and or any Recreational drug use.  On your next visit with your primary care physician please Get Medicines reviewed and adjusted.  Please request your Prim.MD to go over all Hospital Tests and Procedure/Radiological results at the follow up, please get all Hospital records sent to your Prim MD by signing hospital release before you go home.  If you experience worsening of your admission symptoms, develop shortness of breath, life threatening emergency, suicidal or homicidal thoughts you must seek medical attention immediately by calling 911 or calling your MD immediately  if symptoms less severe.  You Must read complete instructions/literature along with all the possible adverse reactions/side effects for all the Medicines you take and that have been prescribed to you. Take any new Medicines after you have completely understood and accpet all the possible adverse reactions/side effects.

## 2021-12-24 NOTE — Progress Notes (Signed)
Mobility Specialist Progress Note    12/24/21 1123  Mobility  Activity Ambulated independently in hallway  Level of Assistance Independent  Assistive Device None  Distance Ambulated (ft) 800 ft  Activity Response Tolerated well  $Mobility charge 1 Mobility   Pt received in doorway and agreeable. No complaints on walk. Returned to sitting EOB with call bell in reach.    Hildred Alamin Mobility Specialist

## 2021-12-28 ENCOUNTER — Telehealth: Payer: Self-pay | Admitting: Cardiology

## 2021-12-28 NOTE — Telephone Encounter (Signed)
Patient states he was seen in the hospital and wants someone to go over his diagnosis and what is wrong with his heart.

## 2021-12-28 NOTE — Telephone Encounter (Signed)
Left message only  to call back-    (patient has upcoming appointment on 01/03/22. It is post hospital visit. Patient should keep appointment to find out diagnosis)

## 2022-01-03 ENCOUNTER — Ambulatory Visit: Payer: Medicaid Other | Admitting: Cardiology

## 2022-01-05 NOTE — Progress Notes (Unsigned)
Cardiology Office Note:    Date:  01/06/2022   ID:  [name], DOB [birth year], MRN [id number]  PCP:  [name]   [place] Prov[facility] Cardiologist:  Jason Breeding, MD Cardiology APP:  Jason Bottcher, PA { Referring MD: No ref. provider found   Chief Complaint  Patient presents with   Hospitalization Follow-up    PAF    History of Present Illness:    [name] is a 51 y.o. male with a hx of WPW s/p ablation, substance use, alcohol abuse, and alcoholic pancreatitis.  Echocardiogram 2022 with mild concentric LVH.  He has been seen in the ER several times for chest and abdominal pain felt related to chronic pancreatitis.  No coronary disease by heart cath in 2014.  EKG in 2014 with preexcitation ST segment changes consistent with WPW.  Per patient report, he states he underwent WPW ablation at: ?2014/2015.  He has done well until recently.  He reports having rapid heart rate sometimes in the 190s but mostly 135.  He was recently hospitalized at 12/06/2021 with chest and abdominal pain found to be in A-fib RVR.  He converted spontaneously.  Telemetry notable for some aberrant conduction.  Of note, he had been out of his Toprol for about a month prior to this evaluation.  Toprol was restarted at 100 mg daily.  He does have a history of cocaine use but was tolerating this beta-blocker in the past and is working on negative drug screens to get into pain clinic for pancreatitis.  Given his low CHA2DS2-VASc score, decision was made to not anticoagulate.  Echocardiogram with preserved EF and no significant valvular disease.  He presents today for hospitalization follow-up. He describes chest pain with exertion that is progressing - several months ago he had chest pain every once in a while and now has exertional chest pain 2-3 times per week. CP on the right side of his chest, lasts for 20-30 min, and relieved with rest. He can have diaphoresis with CP and headache.  He does not have nitro SL. He does not have an active prescription for cialis. He is having frequent palpitations associated with chest discomfort, no syncope.    Past Medical History:  Diagnosis Date   Alcoholism /alcohol abuse    Anemia    Anxiety    Arthritis    "knees; arms; elbows" (03/26/2015)   Asthma    Bipolar disorder (HCC)    Chronic bronchitis (HCC)    Chronic lower back pain    Chronic pancreatitis ([place])    Cocaine abuse ([place])    Depression    Family history of adverse reaction to anesthesia    "grandmother gets confused"   Femoral condyle fracture ([place]) 03/08/2014   left medial/notes 03/09/2014   GERD (gastroesophageal reflux disease)    H/O hiatal hernia    H/O suicide attempt 10/2012   High cholesterol    History of blood transfusion 10/2012   "when I tried to commit suicide"   History of stomach ulcers    Hypertension    Marijuana abuse, continuous    Migraine    "a few times/year" (03/26/2015)   Pneumonia 1990's X 3   PTSD (post-traumatic stress disorder)    Sickle cell trait ([place])    WPW (Wolff-Parkinson-White syndrome)    [name] 03/06/2013    Past Surgical History:  Procedure Laterality Date   BIOPSY  11/25/2017   Procedure: BIOPSY;  Surgeon: Arta Silence, MD;  Location: [place];  Service: Endoscopy;;   BIOPSY  10/14/2018   Procedure: BIOPSY;  Surgeon: Arta Silence, MD;  Location: [place];  Service: Endoscopy;;   CARDIAC CATHETERIZATION     CYST ENTEROSTOMY  01/02/2020   Procedure: CYST ASPIRATION;  Surgeon: Milus Banister, MD;  Location: [facility];  Service: Endoscopy;;   ESOPHAGOGASTRODUODENOSCOPY (EGD) WITH PROPOFOL N/A 11/25/2017   Procedure: ESOPHAGOGASTRODUODENOSCOPY (EGD) WITH PROPOFOL;  Surgeon: Arta Silence, MD;  Location: [place];  Service: Endoscopy;  Laterality: N/A;   ESOPHAGOGASTRODUODENOSCOPY (EGD) WITH PROPOFOL Left 10/14/2018   Procedure: ESOPHAGOGASTRODUODENOSCOPY (EGD) WITH PROPOFOL;  Surgeon: Arta Silence, MD;  [facility] ENDOSCOPY;  Service: Endoscopy;  Laterality: Left;   ESOPHAGOGASTRODUODENOSCOPY (EGD) WITH PROPOFOL N/A 11/14/2018   Procedure: ESOPHAGOGASTRODUODENOSCOPY (EGD) WITH PROPOFOL;  Surgeon: Laurence Spates, MD;  Location: [facility];  Service: Gastroenterology;  Laterality: N/A;   ESOPHAGOGASTRODUODENOSCOPY (EGD) WITH PROPOFOL N/A 01/02/2020   Procedure: ESOPHAGOGASTRODUODENOSCOPY (EGD) WITH PROPOFOL;  Surgeon: Milus Banister, MD;  Location: [facility];  Service: Endoscopy;  Laterality: N/A;   ESOPHAGOGASTRODUODENOSCOPY (EGD) WITH PROPOFOL N/A 10/25/2020   Procedure: ESOPHAGOGASTRODUODENOSCOPY (EGD) WITH PROPOFOL;  Surgeon: Rush Landmark Telford Nab., MD;  Location: [place];  Service: Gastroenterology;  Laterality: N/A;   EUS N/A 01/02/2020   Procedure: UPPER ENDOSCOPIC ULTRASOUND (EUS) RADIAL;  Surgeon: Milus Banister, MD;  Location: [facility];  Service: Endoscopy;  Laterality: N/A;   EYE SURGERY Left 1990's   "result of trauma"    FACIAL FRACTURE SURGERY Left 1990's   "result of trauma"    FLEXIBLE SIGMOIDOSCOPY N/A 10/25/2020   Procedure: FLEXIBLE SIGMOIDOSCOPY;  Surgeon: Irving Copas., MD;  Location: [place];  Service: Gastroenterology;  Laterality: N/A;   FRACTURE SURGERY     HEMOSTASIS CLIP PLACEMENT  10/25/2020   Procedure: HEMOSTASIS CLIP PLACEMENT;  Surgeon: Irving Copas., MD;  Location: [place];  Service: Gastroenterology;;   HERNIA REPAIR     LEFT HEART CATHETERIZATION WITH CORONARY ANGIOGRAM Right 03/07/2013   Procedure: LEFT HEART CATHETERIZATION WITH CORONARY ANGIOGRAM;  Surgeon: [name], MD;  Location: [place] CATH LAB;  Service: Cardiovascular;  Laterality: Right;   UMBILICAL HERNIA REPAIR     UPPER GASTROINTESTINAL ENDOSCOPY      Current Medications: Current Meds  Medication Sig   acetaminophen (TYLENOL) 500 MG tablet Take 500 mg by mouth every 6 (six) hours as needed for moderate pain or headache.   albuterol  (VENTOLIN HFA) 108 (90 Base) MCG/ACT inhaler Inhale 2 puffs into the lungs every 4 (four) hours as needed for wheezing or shortness of breath.   folic acid (FOLVITE) 1 MG tablet Take 1 tablet (1 mg total) by mouth daily.   HYDROcodone-acetaminophen (NORCO/VICODIN) 5-325 MG tablet Take 1 tablet by mouth every 12 (twelve) hours as needed for moderate pain.   lidocaine (LMX) 4 % cream Apply topically 4 (four) times daily as needed (NGT pain).   lipase/protease/amylase (CREON) 36000 UNITS CPEP capsule Take 2 capsules (72,000 Units total) by mouth 3 (three) times daily with meals.   magnesium oxide (MAG-OX) 400 (240 Mg) MG tablet Take 1 tablet (400 mg total) by mouth daily.   metoprolol succinate (TOPROL XL) 25 MG 24 hr tablet Take 1 tablet (25 mg total) by mouth as needed (for palpitations). To not exceed 1 dose per day.   metoprolol succinate (TOPROL-XL) 100 MG 24 hr tablet Take 1 tablet (100 mg total) by mouth daily. Take with or immediately following a meal.   Multiple Vitamin (MULTIVITAMIN WITH MINERALS) TABS tablet Take 1  tablet by mouth daily.   nitroGLYCERIN (NITROSTAT) 0.4 MG SL tablet Place 1 tablet (0.4 mg total) under the tongue every 5 (five) minutes as needed for chest pain. Do not exceed 3 doses in a row.   ondansetron (ZOFRAN) 4 MG tablet Take 1 tablet (4 mg total) by mouth every 6 (six) hours.   pantoprazole (PROTONIX) 40 MG tablet Take 1 tablet (40 mg total) by mouth 2 (two) times daily.   Polyethyl Glycol-Propyl Glycol (SYSTANE OP) Place 1 drop into both eyes 4 (four) times daily as needed (for dryness).   sucralfate (CARAFATE) 1 g tablet Take 1 tablet (1 g total) by mouth 4 (four) times daily -  with meals and at bedtime.   thiamine 100 MG tablet Take 1 tablet (100 mg total) by mouth daily.   Vitamin D, Ergocalciferol, (DRISDOL) 1.25 MG (50000 UNIT) CAPS capsule Take 50,000 Units by mouth every Wednesday.     Allergies:   Robaxin [methocarbamol], Aspirin, Shellfish-derived products,  Trazodone and nefazodone, Adhesive [tape], Latex, Toradol [ketorolac tromethamine], Contrast media [iodinated contrast media], Reglan [metoclopramide], and Salmon [fish oil]   Social History   Socioeconomic History   Marital status: Single    Spouse name: Not on file   Number of children: Not on file   Years of education: Not on file   Highest education level: Not on file  Occupational History   Occupation: disabled  Tobacco Use   Smoking status: Every Day    Packs/day: 1.00    Years: 36.00    Total pack years: 36.00    Types: Cigarettes, E-cigarettes   Smokeless tobacco: Never  Vaping Use   Vaping Use: Former  Substance and Sexual Activity   Alcohol use: Not Currently    Comment: last drink a few days ago   Drug use: Not Currently    Types: Marijuana, Cocaine   Sexual activity: Yes    Birth control/protection: None  Other Topics Concern   Not on file  Social History Narrative   ** Merged History Encounter **       Social Determinants of Health   Financial Resource Strain: Not on file  Food Insecurity: Not on file  Transportation Needs: Not on file  Physical Activity: Not on file  Stress: Not on file  Social Connections: Not on file     Family History: The patient's family history includes Alcoholism in his father; Cirrhosis in his father; Coronary artery disease in an other family member; Hypertension in his father, mother, and another family member; Melanoma in his father.  ROS:   Please see the history of present illness.     All other systems reviewed and are negative.  EKGs/Labs/Other Studies Reviewed:    The following studies were reviewed today:  Echo 12/07/21: 1. Left ventricular ejection fraction, by estimation, is 65 to 70%. The  left ventricle has normal function. The left ventricle has no regional  wall motion abnormalities. Left ventricular diastolic parameters are  consistent with Grade I diastolic  dysfunction (impaired relaxation).   2. Right  ventricular systolic function is normal. The right ventricular  size is normal. There is normal pulmonary artery systolic pressure.   3. The mitral valve is normal in structure. No evidence of mitral valve  regurgitation. No evidence of mitral stenosis.   4. The aortic valve is tricuspid. Aortic valve regurgitation is not  visualized. No aortic stenosis is present.   5. The inferior vena cava is normal in size with greater than 50%  respiratory variability, suggesting right atrial pressure of 3 mmHg.   EKG:  EKG is  ordered today.  The ekg ordered today demonstrates sinus rhythm with HR 74, short PR interval and frequent PVCs  Recent Labs: 08/18/2021: B Natriuretic Peptide 1,104.6 12/22/2021: Magnesium 1.4 12/23/2021: ALT 20; BUN 12; Creatinine, Ser 0.74; Hemoglobin 9.4; Platelets 253; Potassium 3.7; Sodium 135  Recent Lipid Panel    Component Value Date/Time   CHOL 175 10/30/2020 0545   TRIG 142 06/07/2021 0239   HDL 89 10/30/2020 0545   CHOLHDL 2.0 10/30/2020 0545   VLDL 8 10/30/2020 0545   LDLCALC 78 10/30/2020 0545     Risk Assessment/Calculations:    CHA2DS2-VASc Score = 1   This indicates a 0.6% annual risk of stroke. The patient's score is based upon: CHF History: 0 HTN History: 1 Diabetes History: 0 Stroke History: 0 Vascular Disease History: 0 Age Score: 0 Gender Score: 0                Physical Exam:    VS:  BP 100/80   Pulse (!) 119   Ht '5\' 8"'$  (1.727 m)   Wt 121 lb 9.6 oz (55.2 kg)   SpO2 97%   BMI 18.49 kg/m     Wt Readings from Last 3 Encounters:  01/06/22 121 lb 9.6 oz (55.2 kg)  12/24/21 117 lb 4.6 oz (53.2 kg)  11/29/21 134 lb 7.7 oz (61 kg)     GEN:  Well nourished, well developed in no acute distress HEENT: Normal NECK: No JVD; No carotid bruits LYMPHATICS: No lymphadenopathy CARDIAC: irregular rhythm, regular rate, no murmur, frequent ectopy ?ventricular trigeminy RESPIRATORY:  Clear to auscultation without rales, wheezing or rhonchi   ABDOMEN: Soft, non-tender, non-distended MUSCULOSKELETAL:  No edema; No deformity  SKIN: Warm and dry NEUROLOGIC:  Alert and oriented x 3 PSYCHIATRIC:  Normal affect   ASSESSMENT:    1. Chest pain of uncertain etiology   2. Paroxysmal atrial fibrillation with RVR (HCC)   3. Palpitations   4. WPW (Wolff-Parkinson-White syndrome)   5. Essential hypertension   6. Alcohol use disorder, severe, dependence ([place])    PLAN:    In order of problems listed above:  Chest pain Chest pain concerning for stable angina. CP is exertional, feels like a pressure, lasts 20-30 min, relieved with rest, and associated with diaphoresis and nausea, no vomiting.  Will obtain repeat nuclear stress test. I opted to avoid CT coronary given ectopy on EKG and exam already on 100 mg toprol.  He also states he thinks amlodipine has helped his chest pain. Will provide nitro SL, he does not have an active prescription for cialis.    A-fib with RVR Palpitations We will plan for heart monitor Managed on 100 mg Toprol I will add 25 mg toprol PRN for palpitations.    History of WPW s/p ablation He states ablation was performed at [facility] in 2015 Heart monitor as above   Hypertension Managed with 10 mg amlodipine, toprol 100 mg BP well controlled today, no medication changes other than 25 mg toprol PRN   Substance abuse Heavily counseled on alcohol cessation and avoidance of cocaine.    Follow up in 5 weeks after heart monitor and nuclear stress test.      Shared Decision Making/Informed Consent The risks [chest pain, shortness of breath, cardiac arrhythmias, dizziness, blood pressure fluctuations, myocardial infarction, stroke/transient ischemic attack, nausea, vomiting, allergic reaction, radiation exposure, metallic taste sensation and life-threatening complications (estimated to be  1 in 10,000)], benefits (risk stratification, diagnosing coronary artery disease, treatment guidance) and alternatives of  a nuclear stress test were discussed in detail with Mr. Crocket and he agrees to proceed.    Medication Adjustments/Labs and Tests Ordered: Current medicines are reviewed at length with the patient today.  Concerns regarding medicines are outlined above.  Orders Placed This Encounter  Procedures   MYOCARDIAL PERFUSION IMAGING   LONG TERM MONITOR (3-14 DAYS)   EKG 12-Lead   Meds ordered this encounter  Medications   metoprolol succinate (TOPROL XL) 25 MG 24 hr tablet    Sig: Take 1 tablet (25 mg total) by mouth as needed (for palpitations). To not exceed 1 dose per day.    Dispense:  30 tablet    Refill:  3   nitroGLYCERIN (NITROSTAT) 0.4 MG SL tablet    Sig: Place 1 tablet (0.4 mg total) under the tongue every 5 (five) minutes as needed for chest pain. Do not exceed 3 doses in a row.    Dispense:  25 tablet    Refill:  3    Patient Instructions  Medication Instructions:   -Metoprolol succinate (toprol-xl) '25mg'$  as needed for palpitations.  -Nitroglycerin SL .'4mg'$  as needed for chest pain every 5 mins for up to 3 doses total.  -You can not take cialis.  *If you need a refill on your cardiac medications before your next appointment, please call your pharmacy*   Testing/Procedures: Your physician has requested that you have a lexiscan myoview. For further information please visit [email or web address]. Please follow instruction sheet, as given. This will take place at [address]  How to prepare for your Myocardial Perfusion Test: Do not eat or drink 3 hours prior to your test, except you may have water. Do not consume products containing caffeine (regular or decaffeinated) 12 hours prior to your test. (ex: coffee, chocolate, sodas, tea). Do bring a list of your current medications with you.  If not listed below, you may take your medications as normal. Do wear comfortable clothes (no dresses or overalls) and walking shoes, tennis shoes preferred (No heels or open  toe shoes are allowed). Do NOT wear cologne, perfume, aftershave, or lotions (deodorant is allowed). The test will take approximately 3 to 4 hours to complete If these instructions are not followed, your test will have to be rescheduled.  ZIO XT- Long Term Monitor Instructions  Your physician has requested you wear a ZIO patch monitor for 14 days.  This is a single patch monitor. [employer] supplies one patch monitor per enrollment. Additional stickers are not available. Please do not apply patch if you will be having a Nuclear Stress Test,  Echocardiogram, Cardiac CT, MRI, or Chest Xray during the period you would be wearing the  monitor. The patch cannot be worn during these tests. You cannot remove and re-apply the  ZIO XT patch monitor.  Your ZIO patch monitor will be mailed 3 day USPS to your address on file. It may take 3-5 days  to receive your monitor after you have been enrolled.  Once you have received your monitor, please review the enclosed instructions. Your monitor  has already been registered assigning a specific monitor serial # to you.  Billing and Patient Assistance Program Information  We have supplied [employer] with any of your insurance information on file for billing purposes. [employer] offers a sliding scale Patient Assistance Program for patients that do not have  insurance, or whose insurance does not  completely cover the cost of the ZIO monitor.  You must apply for the Patient Assistance Program to qualify for this discounted rate.  To apply, please call [employer] at [phone], select option 4, select option 2, ask to apply for  Patient Assistance Program. Theodore Demark will ask your household income, and how many people  are in your household. They will quote your out-of-pocket cost based on that information.  [employer] will also be able to set up a 30-month interest-free payment plan if needed.  Applying the monitor   Shave hair from upper left chest.  Hold abrader disc  by orange tab. Rub abrader in 40 strokes over the upper left chest as  indicated in your monitor instructions.  Clean area with 4 enclosed alcohol pads. Let dry.  Apply patch as indicated in monitor instructions. Patch will be placed under collarbone on left  side of chest with arrow pointing upward.  Rub patch adhesive wings for 2 minutes. Remove white label marked "1". Remove the white  label marked "2". Rub patch adhesive wings for 2 additional minutes.  While looking in a mirror, press and release button in center of patch. A small green light will  flash 3-4 times. This will be your only indicator that the monitor has been turned on.  Do not shower for the first 24 hours. You may shower after the first 24 hours.  Press the button if you feel a symptom. You will hear a small click. Record Date, Time and  Symptom in the Patient Logbook.  When you are ready to remove the patch, follow instructions on the last 2 pages of Patient  Logbook. Stick patch monitor onto the last page of Patient Logbook.  Place Patient Logbook in the blue and white box. Use locking tab on box and tape box closed  securely. The blue and white box has prepaid postage on it. Please place it in the mailbox as  soon as possible. Your physician should have your test results approximately 7 days after the  monitor has been mailed back to [facility]  Call [employer] [phone] you have questions regarding  your ZIO XT patch monitor. Call them immediately if you see an orange light blinking on your  monitor.  If your monitor falls off in less than 4 days, contact our Monitor department at [phone]  If your monitor becomes loose or falls off after 4 days call [employer] at [phone]  suggestions on securing your monitor    Follow-Up: At [facility] you and your health needs are our priority.  As part of our continuing mission to provide you with exceptional heart care,  we have created designated Provider Care Teams.  These Care Teams include your primary Cardiologist (physician) and Advanced Practice Providers (APPs -  Physician Assistants and Nurse Practitioners) who all work together to provide you with the care you need, when you need it.  We recommend signing up for the patient portal called "MyChart".  Sign up information is provided on this After Visit Summary.  MyChart is used to connect with patients for Virtual Visits (Telemedicine).  Patients are able to view lab/test results, encounter notes, upcoming appointments, etc.  Non-urgent messages can be sent to your provider as well.   To learn more about what you can do with MyChart, go to [email or web address]    Your next appointment:   5-6 week(s)  The format for your next appointment:   In Person  Provider:  Fabian Sharp, PA-C or Jason Breeding, MD     Other Instructions Angie recommends quitting all recreation drug use and alcohol use.   Signed, [name], [name]  01/06/2022 11:35 AM    [place]

## 2022-01-06 ENCOUNTER — Ambulatory Visit: Payer: Medicaid Other | Attending: Cardiology | Admitting: Physician Assistant

## 2022-01-06 ENCOUNTER — Encounter: Payer: Self-pay | Admitting: Physician Assistant

## 2022-01-06 VITALS — BP 100/80 | HR 119 | Ht 68.0 in | Wt 121.6 lb

## 2022-01-06 DIAGNOSIS — I10 Essential (primary) hypertension: Secondary | ICD-10-CM | POA: Diagnosis present

## 2022-01-06 DIAGNOSIS — F1020 Alcohol dependence, uncomplicated: Secondary | ICD-10-CM | POA: Diagnosis present

## 2022-01-06 DIAGNOSIS — R079 Chest pain, unspecified: Secondary | ICD-10-CM | POA: Diagnosis present

## 2022-01-06 DIAGNOSIS — I456 Pre-excitation syndrome: Secondary | ICD-10-CM | POA: Diagnosis present

## 2022-01-06 DIAGNOSIS — I480 Paroxysmal atrial fibrillation: Secondary | ICD-10-CM | POA: Diagnosis present

## 2022-01-06 DIAGNOSIS — R002 Palpitations: Secondary | ICD-10-CM | POA: Insufficient documentation

## 2022-01-06 MED ORDER — NITROGLYCERIN 0.4 MG SL SUBL: 0.4000 mg | SUBLINGUAL_TABLET | SUBLINGUAL | 3 refills | Status: DC | PRN

## 2022-01-06 MED ORDER — METOPROLOL SUCCINATE ER 25 MG PO TB24: 25.0000 mg | ORAL_TABLET | ORAL | 3 refills | Status: DC | PRN

## 2022-01-06 NOTE — Patient Instructions (Addendum)
Medication Instructions:   -Metoprolol succinate (toprol-xl) '25mg'$  as needed for palpitations.  -Nitroglycerin SL .'4mg'$  as needed for chest pain every 5 mins for up to 3 doses total.  -You can not take cialis.  *If you need a refill on your cardiac medications before your next appointment, please call your pharmacy*   Testing/Procedures: Your physician has requested that you have a lexiscan myoview. For further information please visit [email or web address]. Please follow instruction sheet, as given. This will take place at [address]  How to prepare for your Myocardial Perfusion Test: Do not eat or drink 3 hours prior to your test, except you may have water. Do not consume products containing caffeine (regular or decaffeinated) 12 hours prior to your test. (ex: coffee, chocolate, sodas, tea). Do bring a list of your current medications with you.  If not listed below, you may take your medications as normal. Do wear comfortable clothes (no dresses or overalls) and walking shoes, tennis shoes preferred (No heels or open toe shoes are allowed). Do NOT wear cologne, perfume, aftershave, or lotions (deodorant is allowed). The test will take approximately 3 to 4 hours to complete If these instructions are not followed, your test will have to be rescheduled.  ZIO XT- Long Term Monitor Instructions  Your physician has requested you wear a ZIO patch monitor for 14 days.  This is a single patch monitor. [employer] supplies one patch monitor per enrollment. Additional stickers are not available. Please do not apply patch if you will be having a Nuclear Stress Test,  Echocardiogram, Cardiac CT, MRI, or Chest Xray during the period you would be wearing the  monitor. The patch cannot be worn during these tests. You cannot remove and re-apply the  ZIO XT patch monitor.  Your ZIO patch monitor will be mailed 3 day USPS to your address on file. It may take 3-5 days  to receive your monitor  after you have been enrolled.  Once you have received your monitor, please review the enclosed instructions. Your monitor  has already been registered assigning a specific monitor serial # to you.  Billing and Patient Assistance Program Information  We have supplied [employer] with any of your insurance information on file for billing purposes. [employer] offers a sliding scale Patient Assistance Program for patients that do not have  insurance, or whose insurance does not completely cover the cost of the ZIO monitor.  You must apply for the Patient Assistance Program to qualify for this discounted rate.  To apply, please call [employer] at [phone], select option 4, select option 2, ask to apply for  Patient Assistance Program. Theodore Demark will ask your household income, and how many people  are in your household. They will quote your out-of-pocket cost based on that information.  [employer] will also be able to set up a 17-month interest-free payment plan if needed.  Applying the monitor   Shave hair from upper left chest.  Hold abrader disc by orange tab. Rub abrader in 40 strokes over the upper left chest as  indicated in your monitor instructions.  Clean area with 4 enclosed alcohol pads. Let dry.  Apply patch as indicated in monitor instructions. Patch will be placed under collarbone on left  side of chest with arrow pointing upward.  Rub patch adhesive wings for 2 minutes. Remove white label marked "1". Remove the white  label marked "2". Rub patch adhesive wings for 2 additional minutes.  While looking in a mirror, press and  release button in center of patch. A small green light will  flash 3-4 times. This will be your only indicator that the monitor has been turned on.  Do not shower for the first 24 hours. You may shower after the first 24 hours.  Press the button if you feel a symptom. You will hear a small click. Record Date, Time and  Symptom in the Patient Logbook.  When you are  ready to remove the patch, follow instructions on the last 2 pages of Patient  Logbook. Stick patch monitor onto the last page of Patient Logbook.  Place Patient Logbook in the blue and white box. Use locking tab on box and tape box closed  securely. The blue and white box has prepaid postage on it. Please place it in the mailbox as  soon as possible. Your physician should have your test results approximately 7 days after the  monitor has been mailed back to [facility].  Call [facility] at [phone] if you have questions regarding  your ZIO XT patch monitor. Call them immediately if you see an orange light blinking on your  monitor.  If your monitor falls off in less than 4 days, contact our Monitor department at [phone].  If your monitor becomes loose or falls off after 4 days call [employer] at [phone] for  suggestions on securing your monitor    Follow-Up: At [facility], you and your health needs are our priority.  As part of our continuing mission to provide you with exceptional heart care, we have created designated Provider Care Teams.  These Care Teams include your primary Cardiologist (physician) and Advanced Practice Providers (APPs -  Physician Assistants and Nurse Practitioners) who all work together to provide you with the care you need, when you need it.  We recommend signing up for the patient portal called "MyChart".  Sign up information is provided on this After Visit Summary.  MyChart is used to connect with patients for Virtual Visits (Telemedicine).  Patients are able to view lab/test results, encounter notes, upcoming appointments, etc.  Non-urgent messages can be sent to your provider as well.   To learn more about what you can do with MyChart, go to [email or web address].    Your next appointment:   5-6 week(s)  The format for your next appointment:   In Person  Provider:   Fabian Sharp, PA-C or Minus Breeding, MD      Other Instructions Angie recommends quitting all recreation drug use and alcohol use.

## 2022-01-11 ENCOUNTER — Ambulatory Visit: Payer: Medicaid Other

## 2022-01-11 ENCOUNTER — Ambulatory Visit: Payer: Medicaid Other | Attending: Physician Assistant

## 2022-01-11 DIAGNOSIS — R079 Chest pain, unspecified: Secondary | ICD-10-CM

## 2022-01-11 DIAGNOSIS — I480 Paroxysmal atrial fibrillation: Secondary | ICD-10-CM

## 2022-01-11 DIAGNOSIS — I456 Pre-excitation syndrome: Secondary | ICD-10-CM

## 2022-01-11 DIAGNOSIS — R002 Palpitations: Secondary | ICD-10-CM

## 2022-01-11 DIAGNOSIS — F1020 Alcohol dependence, uncomplicated: Secondary | ICD-10-CM | POA: Diagnosis present

## 2022-01-11 DIAGNOSIS — I10 Essential (primary) hypertension: Secondary | ICD-10-CM

## 2022-01-11 MED ORDER — TECHNETIUM TC 99M TETROFOSMIN IV KIT
10.1000 | PACK | Freq: Once | INTRAVENOUS | Status: AC | PRN
  Administered 2022-01-11: 10.1 via INTRAVENOUS

## 2022-01-11 NOTE — Progress Notes (Unsigned)
[id number] from office inventory applied to patient.

## 2022-01-12 ENCOUNTER — Ambulatory Visit: Payer: Medicaid Other | Admitting: Gastroenterology

## 2022-01-12 ENCOUNTER — Ambulatory Visit (HOSPITAL_COMMUNITY): Payer: Medicaid Other | Attending: Internal Medicine

## 2022-01-12 DIAGNOSIS — I480 Paroxysmal atrial fibrillation: Secondary | ICD-10-CM | POA: Insufficient documentation

## 2022-01-12 DIAGNOSIS — I10 Essential (primary) hypertension: Secondary | ICD-10-CM | POA: Insufficient documentation

## 2022-01-12 DIAGNOSIS — F1020 Alcohol dependence, uncomplicated: Secondary | ICD-10-CM | POA: Diagnosis present

## 2022-01-12 DIAGNOSIS — I456 Pre-excitation syndrome: Secondary | ICD-10-CM | POA: Insufficient documentation

## 2022-01-12 DIAGNOSIS — R079 Chest pain, unspecified: Secondary | ICD-10-CM | POA: Diagnosis present

## 2022-01-12 DIAGNOSIS — R002 Palpitations: Secondary | ICD-10-CM | POA: Diagnosis present

## 2022-01-12 LAB — MYOCARDIAL PERFUSION IMAGING
LV dias vol: 56 mL (ref 62–150)
LV sys vol: 30 mL
Nuc Stress EF: 47 %
Peak HR: 127 {beats}/min
Rest HR: 87 {beats}/min
Rest Nuclear Isotope Dose: 10.1 mCi
SDS: 1
SRS: 0
SSS: 1
ST Depression (mm): 0 mm
Stress Nuclear Isotope Dose: 32.8 mCi
TID: 0.91

## 2022-01-12 MED ORDER — TECHNETIUM TC 99M TETROFOSMIN IV KIT
32.8000 | PACK | Freq: Once | INTRAVENOUS | Status: AC | PRN
  Administered 2022-01-12: 32.8 via INTRAVENOUS

## 2022-01-12 MED ORDER — REGADENOSON 0.4 MG/5ML IV SOLN
0.4000 mg | Freq: Once | INTRAVENOUS | Status: AC
  Administered 2022-01-12: 0.4 mg via INTRAVENOUS

## 2022-01-13 NOTE — Progress Notes (Unsigned)
Ordered limited echo

## 2022-01-17 ENCOUNTER — Telehealth: Payer: Self-pay | Admitting: Physician Assistant

## 2022-01-17 NOTE — Telephone Encounter (Signed)
Patient informed of Lexiscan Myoview results per A. Duke.Marland KitchenMarland KitchenYour stress test did not show any evidence of blockages in the arteries around your heart. However, there may be a reduction in the squeeze function of your heart. Given your new symptoms, I think we should repeat a limited echo to check your heart function.  Sent note to scheduler to call patient for limited echo.

## 2022-01-17 NOTE — Telephone Encounter (Signed)
   Pt is calling to get stress test result. He said to call him at [phone]

## 2022-01-20 ENCOUNTER — Other Ambulatory Visit: Payer: Self-pay

## 2022-01-20 ENCOUNTER — Observation Stay (HOSPITAL_COMMUNITY)
Admission: EM | Admit: 2022-01-20 | Discharge: 2022-01-21 | Discharge status: Against Medical Advice | Payer: Medicaid Other | Attending: Internal Medicine | Admitting: Internal Medicine

## 2022-01-20 ENCOUNTER — Observation Stay (HOSPITAL_COMMUNITY): Payer: Medicaid Other

## 2022-01-20 ENCOUNTER — Emergency Department (HOSPITAL_COMMUNITY): Payer: Medicaid Other

## 2022-01-20 DIAGNOSIS — Z91040 Latex allergy status: Secondary | ICD-10-CM | POA: Insufficient documentation

## 2022-01-20 DIAGNOSIS — I472 Ventricular tachycardia, unspecified: Secondary | ICD-10-CM | POA: Diagnosis not present

## 2022-01-20 DIAGNOSIS — G20 Parkinson's disease: Secondary | ICD-10-CM | POA: Diagnosis not present

## 2022-01-20 DIAGNOSIS — Z7901 Long term (current) use of anticoagulants: Secondary | ICD-10-CM | POA: Insufficient documentation

## 2022-01-20 DIAGNOSIS — I480 Paroxysmal atrial fibrillation: Principal | ICD-10-CM | POA: Diagnosis present

## 2022-01-20 DIAGNOSIS — R109 Unspecified abdominal pain: Secondary | ICD-10-CM | POA: Diagnosis present

## 2022-01-20 DIAGNOSIS — R002 Palpitations: Secondary | ICD-10-CM | POA: Diagnosis present

## 2022-01-20 DIAGNOSIS — R079 Chest pain, unspecified: Secondary | ICD-10-CM

## 2022-01-20 DIAGNOSIS — F1020 Alcohol dependence, uncomplicated: Secondary | ICD-10-CM | POA: Diagnosis present

## 2022-01-20 DIAGNOSIS — I10 Essential (primary) hypertension: Secondary | ICD-10-CM | POA: Diagnosis not present

## 2022-01-20 DIAGNOSIS — R1013 Epigastric pain: Secondary | ICD-10-CM | POA: Insufficient documentation

## 2022-01-20 DIAGNOSIS — J45909 Unspecified asthma, uncomplicated: Secondary | ICD-10-CM | POA: Diagnosis not present

## 2022-01-20 DIAGNOSIS — I4729 Other ventricular tachycardia: Secondary | ICD-10-CM

## 2022-01-20 DIAGNOSIS — Z79899 Other long term (current) drug therapy: Secondary | ICD-10-CM | POA: Diagnosis not present

## 2022-01-20 DIAGNOSIS — I456 Pre-excitation syndrome: Secondary | ICD-10-CM | POA: Diagnosis present

## 2022-01-20 DIAGNOSIS — I4891 Unspecified atrial fibrillation: Secondary | ICD-10-CM | POA: Diagnosis not present

## 2022-01-20 DIAGNOSIS — E872 Acidosis, unspecified: Secondary | ICD-10-CM | POA: Diagnosis present

## 2022-01-20 DIAGNOSIS — F17210 Nicotine dependence, cigarettes, uncomplicated: Secondary | ICD-10-CM | POA: Insufficient documentation

## 2022-01-20 DIAGNOSIS — F332 Major depressive disorder, recurrent severe without psychotic features: Secondary | ICD-10-CM | POA: Diagnosis present

## 2022-01-20 DIAGNOSIS — K219 Gastro-esophageal reflux disease without esophagitis: Secondary | ICD-10-CM | POA: Diagnosis present

## 2022-01-20 LAB — COMPREHENSIVE METABOLIC PANEL
ALT: 40 U/L (ref 0–44)
AST: 48 U/L — ABNORMAL HIGH (ref 15–41)
Albumin: 3.7 g/dL (ref 3.5–5.0)
Alkaline Phosphatase: 68 U/L (ref 38–126)
Anion gap: 11 (ref 5–15)
BUN: 8 mg/dL (ref 6–20)
CO2: 18 mmol/L — ABNORMAL LOW (ref 22–32)
Calcium: 7.3 mg/dL — ABNORMAL LOW (ref 8.9–10.3)
Chloride: 113 mmol/L — ABNORMAL HIGH (ref 98–111)
Creatinine, Ser: 0.77 mg/dL (ref 0.61–1.24)
GFR, Estimated: >60 mL/min (ref >60)
Glucose, Bld: 164 mg/dL — ABNORMAL HIGH (ref 70–99)
Potassium: 3.9 mmol/L (ref 3.5–5.1)
Sodium: 142 mmol/L (ref 135–145)
Total Bilirubin: 0.3 mg/dL (ref 0.3–1.2)
Total Protein: 6.5 g/dL (ref 6.5–8.1)

## 2022-01-20 LAB — PROTIME-INR
INR: 0.9 (ref 0.8–1.2)
Prothrombin Time: 12.5 seconds (ref 11.4–15.2)

## 2022-01-20 LAB — LIPASE, BLOOD: Lipase: 24 U/L (ref 11–51)

## 2022-01-20 LAB — I-STAT CHEM 8, ED
BUN: 7 mg/dL (ref 6–20)
Calcium, Ion: 0.93 mmol/L — ABNORMAL LOW (ref 1.15–1.40)
Chloride: 112 mmol/L — ABNORMAL HIGH (ref 98–111)
Creatinine, Ser: 0.6 mg/dL — ABNORMAL LOW (ref 0.61–1.24)
Glucose, Bld: 161 mg/dL — ABNORMAL HIGH (ref 70–99)
HCT: 42 % (ref 39.0–52.0)
Hemoglobin: 14.3 g/dL (ref 13.0–17.0)
Potassium: 3.8 mmol/L (ref 3.5–5.1)
Sodium: 144 mmol/L (ref 135–145)
TCO2: 18 mmol/L — ABNORMAL LOW (ref 22–32)

## 2022-01-20 LAB — TSH: TSH: 2.161 u[IU]/mL (ref 0.350–4.500)

## 2022-01-20 LAB — RAPID URINE DRUG SCREEN, HOSP PERFORMED
Amphetamines: NOT DETECTED
Barbiturates: NOT DETECTED
Benzodiazepines: NOT DETECTED
Cocaine: NOT DETECTED
Opiates: POSITIVE — AB
Tetrahydrocannabinol: NOT DETECTED

## 2022-01-20 LAB — CBC WITH DIFFERENTIAL/PLATELET
Abs Immature Granulocytes: 0.01 10*3/uL (ref 0.00–0.07)
Basophils Absolute: 0 10*3/uL (ref 0.0–0.1)
Basophils Relative: 0 %
Eosinophils Absolute: 0.4 10*3/uL (ref 0.0–0.5)
Eosinophils Relative: 5 %
HCT: 36.3 % — ABNORMAL LOW (ref 39.0–52.0)
Hemoglobin: 11.8 g/dL — ABNORMAL LOW (ref 13.0–17.0)
Immature Granulocytes: 0 %
Lymphocytes Relative: 46 %
Lymphs Abs: 3.8 10*3/uL (ref 0.7–4.0)
MCH: 27.8 pg (ref 26.0–34.0)
MCHC: 32.5 g/dL (ref 30.0–36.0)
MCV: 85.4 fL (ref 80.0–100.0)
Monocytes Absolute: 0.6 10*3/uL (ref 0.1–1.0)
Monocytes Relative: 7 %
Neutro Abs: 3.4 10*3/uL (ref 1.7–7.7)
Neutrophils Relative %: 42 %
Platelets: 162 10*3/uL (ref 150–400)
RBC: 4.25 MIL/uL (ref 4.22–5.81)
RDW: 23 % — ABNORMAL HIGH (ref 11.5–15.5)
WBC: 8.1 10*3/uL (ref 4.0–10.5)
nRBC: 0 % (ref 0.0–0.2)

## 2022-01-20 LAB — TROPONIN I (HIGH SENSITIVITY)
Troponin I (High Sensitivity): 4 ng/L (ref <18)
Troponin I (High Sensitivity): 5 ng/L (ref <18)

## 2022-01-20 LAB — MAGNESIUM: Magnesium: 0.8 mg/dL — CL (ref 1.7–2.4)

## 2022-01-20 LAB — BRAIN NATRIURETIC PEPTIDE: B Natriuretic Peptide: 116.7 pg/mL — ABNORMAL HIGH (ref 0.0–100.0)

## 2022-01-20 MED ORDER — AMIODARONE HCL IN DEXTROSE 360-4.14 MG/200ML-% IV SOLN
30.0000 mg/h | INTRAVENOUS | Status: DC
  Administered 2022-01-20: 30 mg/h via INTRAVENOUS

## 2022-01-20 MED ORDER — LORAZEPAM 2 MG/ML IJ SOLN: 1.0000 mg | INTRAMUSCULAR | Status: DC | PRN

## 2022-01-20 MED ORDER — AMIODARONE HCL IN DEXTROSE 360-4.14 MG/200ML-% IV SOLN
INTRAVENOUS | Status: AC
  Administered 2022-01-20: 60 mg/h via INTRAVENOUS
  Filled 2022-01-20: qty 200

## 2022-01-20 MED ORDER — ACETAMINOPHEN 325 MG PO TABS
650.0000 mg | ORAL_TABLET | Freq: Once | ORAL | Status: AC
  Administered 2022-01-20: 650 mg via ORAL
  Filled 2022-01-20: qty 2

## 2022-01-20 MED ORDER — ALUM & MAG HYDROXIDE-SIMETH 200-200-20 MG/5ML PO SUSP
30.0000 mL | Freq: Once | ORAL | Status: AC
  Administered 2022-01-20: 30 mL via ORAL
  Filled 2022-01-20: qty 30

## 2022-01-20 MED ORDER — SODIUM CHLORIDE 0.9 % IV SOLN: INTRAVENOUS | Status: DC

## 2022-01-20 MED ORDER — METOPROLOL TARTRATE 5 MG/5ML IV SOLN
INTRAVENOUS | Status: AC
  Administered 2022-01-20: 5 mg via INTRAVENOUS
  Filled 2022-01-20: qty 5

## 2022-01-20 MED ORDER — ACETAMINOPHEN 325 MG PO TABS
650.0000 mg | ORAL_TABLET | Freq: Four times a day (QID) | ORAL | Status: DC | PRN
  Administered 2022-01-21: 650 mg via ORAL
  Filled 2022-01-20: qty 2

## 2022-01-20 MED ORDER — ADULT MULTIVITAMIN W/MINERALS CH
1.0000 | ORAL_TABLET | Freq: Every day | ORAL | Status: DC
  Administered 2022-01-20: 1 via ORAL
  Filled 2022-01-20: qty 1

## 2022-01-20 MED ORDER — AMIODARONE HCL IN DEXTROSE 360-4.14 MG/200ML-% IV SOLN
60.0000 mg/h | INTRAVENOUS | Status: AC
  Filled 2022-01-20: qty 200

## 2022-01-20 MED ORDER — AMIODARONE HCL IN DEXTROSE 360-4.14 MG/200ML-% IV SOLN: 60.0000 mg/h | INTRAVENOUS | Status: DC

## 2022-01-20 MED ORDER — AMIODARONE HCL IN DEXTROSE 360-4.14 MG/200ML-% IV SOLN: 30.0000 mg/h | INTRAVENOUS | Status: DC

## 2022-01-20 MED ORDER — CALCIUM GLUCONATE-NACL 1-0.675 GM/50ML-% IV SOLN
1.0000 g | Freq: Once | INTRAVENOUS | Status: AC
  Administered 2022-01-20: 1000 mg via INTRAVENOUS
  Filled 2022-01-20: qty 50

## 2022-01-20 MED ORDER — THIAMINE MONONITRATE 100 MG PO TABS
100.0000 mg | ORAL_TABLET | Freq: Every day | ORAL | Status: DC
  Administered 2022-01-20: 100 mg via ORAL
  Filled 2022-01-20: qty 1

## 2022-01-20 MED ORDER — THIAMINE HCL 100 MG/ML IJ SOLN: 100.0000 mg | Freq: Every day | INTRAMUSCULAR | Status: DC

## 2022-01-20 MED ORDER — POTASSIUM CHLORIDE 10 MEQ/100ML IV SOLN
10.0000 meq | INTRAVENOUS | Status: AC
  Administered 2022-01-20 (×2): 10 meq via INTRAVENOUS
  Filled 2022-01-20 (×2): qty 100

## 2022-01-20 MED ORDER — MAGNESIUM SULFATE 2 GM/50ML IV SOLN
2.0000 g | Freq: Once | INTRAVENOUS | Status: AC
  Administered 2022-01-20: 2 g via INTRAVENOUS
  Filled 2022-01-20: qty 50

## 2022-01-20 MED ORDER — DIPHENHYDRAMINE HCL 50 MG/ML IJ SOLN: 50.0000 mg | Freq: Once | INTRAMUSCULAR | Status: AC

## 2022-01-20 MED ORDER — FOLIC ACID 1 MG PO TABS
1.0000 mg | ORAL_TABLET | Freq: Every day | ORAL | Status: DC
  Administered 2022-01-20: 1 mg via ORAL
  Filled 2022-01-20: qty 1

## 2022-01-20 MED ORDER — AMIODARONE IV BOLUS ONLY 150 MG/100ML
150.0000 mg | Freq: Once | INTRAVENOUS | Status: AC
  Administered 2022-01-20: 150 mg via INTRAVENOUS

## 2022-01-20 MED ORDER — METOPROLOL TARTRATE 5 MG/5ML IV SOLN: 5.0000 mg | Freq: Once | INTRAVENOUS | Status: AC

## 2022-01-20 MED ORDER — DIPHENHYDRAMINE HCL 50 MG/ML IJ SOLN
INTRAMUSCULAR | Status: AC
  Administered 2022-01-20: 50 mg via INTRAVENOUS
  Filled 2022-01-20: qty 1

## 2022-01-20 MED ORDER — MORPHINE SULFATE (PF) 4 MG/ML IV SOLN
4.0000 mg | Freq: Once | INTRAVENOUS | Status: AC
  Administered 2022-01-20: 4 mg via INTRAVENOUS
  Filled 2022-01-20: qty 1

## 2022-01-20 MED ORDER — METOPROLOL SUCCINATE ER 25 MG PO TB24: 100.0000 mg | ORAL_TABLET | Freq: Once | ORAL | Status: DC

## 2022-01-20 MED ORDER — MORPHINE SULFATE (PF) 2 MG/ML IV SOLN
1.0000 mg | Freq: Once | INTRAVENOUS | Status: AC | PRN
  Administered 2022-01-20: 1 mg via INTRAVENOUS
  Filled 2022-01-20: qty 1

## 2022-01-20 MED ORDER — ACETAMINOPHEN 650 MG RE SUPP: 650.0000 mg | Freq: Four times a day (QID) | RECTAL | Status: DC | PRN

## 2022-01-20 MED ORDER — FAMOTIDINE 20 MG PO TABS
20.0000 mg | ORAL_TABLET | Freq: Once | ORAL | Status: AC
  Administered 2022-01-20: 20 mg via ORAL
  Filled 2022-01-20: qty 1

## 2022-01-20 MED ORDER — PROCAINAMIDE HCL 100 MG/ML IJ SOLN
10.0000 mg/kg | Freq: Once | INTRAVENOUS | Status: DC
  Filled 2022-01-20: qty 5.49

## 2022-01-20 MED ORDER — LORAZEPAM 1 MG PO TABS
1.0000 mg | ORAL_TABLET | ORAL | Status: DC | PRN
  Administered 2022-01-20 – 2022-01-21 (×2): 1 mg via ORAL
  Filled 2022-01-20 (×3): qty 1

## 2022-01-20 NOTE — ED Notes (Signed)
Pt requesting something else for pain - MD Rathore messaged at this time - Awaiting orders

## 2022-01-20 NOTE — ED Notes (Signed)
Cardiologist at bedside.  

## 2022-01-20 NOTE — ED Notes (Signed)
Pt refused to use bedside commode - unplugged himself from monitoring and ambulated to the bathroom with no assistance

## 2022-01-20 NOTE — ED Notes (Signed)
Pt taken to CT with this RN.

## 2022-01-20 NOTE — ED Notes (Signed)
NP at bedside.

## 2022-01-20 NOTE — ED Provider Notes (Signed)
[facility] EMERGENCY DEPARTMENT Provider Note   CSN: [id number] Arrival date & time: 01/20/22  1644     History  Chief Complaint  Patient presents with   Chest Pain   Atrial Fibrillation    [name] is a 51 y.o. male.  Patient is a 51 year old male with a past medical history of paroxysmal A-fib not on anticoagulation, WPW status post ablation in 2015, alcohol use disorder, cocaine use disorder, hypertension presenting to the emergency department with palpitations.  Patient states that today he started to have palpitations associated with chest pain.  He states that he felt his heart rate was going fast and so he called 911.  When medics arrived his heart rate was in the 200s to 220s.  They suspected SVT and gave the patient adenosine 6 mg followed by 12 mg followed by 12 mg without any improvement.  Patient attempted vagal maneuvers without any improvement.  Patient reports that he is on metoprolol at home and has not missed any doses.  He reports he has not used cocaine in the last 3 weeks.  He states that he has been medication compliant and has been following with cardiology with a ZIO monitor for his palpitations.  The history is provided by the patient and the EMS personnel. The history is limited by the condition of the patient (Limited by critical acuity).  Chest Pain Atrial Fibrillation Associated symptoms include chest pain.       Home Medications Prior to Admission medications   Medication Sig Start Date End Date Taking? Authorizing Provider  acetaminophen (TYLENOL) 500 MG tablet Take 500 mg by mouth every 6 (six) hours as needed for moderate pain or headache.    [provider]  albuterol (VENTOLIN HFA) 108 (90 Base) MCG/ACT inhaler Inhale 2 puffs into the lungs every 4 (four) hours as needed for wheezing or shortness of breath. 08/17/21   [name], MD  amLODipine (NORVASC) 10 MG tablet Take 1 tablet (10 mg total) by mouth  daily. Patient not taking: Reported on 01/06/2022 12/25/21   [name], MD  folic acid (FOLVITE) 1 MG tablet Take 1 tablet (1 mg total) by mouth daily. 11/26/17   [name], MD  HYDROcodone-acetaminophen (NORCO/VICODIN) 5-325 MG tablet Take 1 tablet by mouth every 12 (twelve) hours as needed for moderate pain. 12/24/21   [name], MD  lidocaine (LMX) 4 % cream Apply topically 4 (four) times daily as needed (NGT pain). 12/24/21   [name], MD  lipase/protease/amylase (CREON) 36000 UNITS CPEP capsule Take 2 capsules (72,000 Units total) by mouth 3 (three) times daily with meals. 12/24/21   [name], MD  magnesium oxide (MAG-OX) 400 (240 Mg) MG tablet Take 1 tablet (400 mg total) by mouth daily. 11/30/21   Davonna Belling, MD  metoprolol succinate (TOPROL XL) 25 MG 24 hr tablet Take 1 tablet (25 mg total) by mouth as needed (for palpitations). To not exceed 1 dose per day. 01/06/22   Duke, Tami Lin, PA  metoprolol succinate (TOPROL-XL) 100 MG 24 hr tablet Take 1 tablet (100 mg total) by mouth daily. Take with or immediately following a meal. 12/25/21   [name], MD  Multiple Vitamin (MULTIVITAMIN WITH MINERALS) TABS tablet Take 1 tablet by mouth daily. 09/06/17   Bonnell Public, MD  nicotine (NICODERM CQ - DOSED IN MG/24 HOURS) 21 mg/24hr patch Place 1 patch (21 mg total) onto the skin at bedtime. Patient  not taking: Reported on 01/06/2022 12/24/21   [name], MD  nitroGLYCERIN (NITROSTAT) 0.4 MG SL tablet Place 1 tablet (0.4 mg total) under the tongue every 5 (five) minutes as needed for chest pain. Do not exceed 3 doses in a row. 01/06/22   Duke, Tami Lin, PA  ondansetron (ZOFRAN) 4 MG tablet Take 1 tablet (4 mg total) by mouth every 6 (six) hours. 11/03/21   Sherrell Puller, PA-C  pantoprazole (PROTONIX) 40 MG tablet Take 1 tablet (40 mg total) by mouth 2 (two) times daily. 12/24/21   [name], MD  Polyethyl Glycol-Propyl Glycol  (SYSTANE OP) Place 1 drop into both eyes 4 (four) times daily as needed (for dryness).    [provider]  sucralfate (CARAFATE) 1 g tablet Take 1 tablet (1 g total) by mouth 4 (four) times daily -  with meals and at bedtime. 10/12/21   Willia Craze, NP  thiamine 100 MG tablet Take 1 tablet (100 mg total) by mouth daily. 10/16/18   [name], MD  Vitamin D, Ergocalciferol, (DRISDOL) 1.25 MG (50000 UNIT) CAPS capsule Take 50,000 Units by mouth every Wednesday.    [provider]  amitriptyline (ELAVIL) 25 MG tablet Take 1 tablet (25 mg total) by mouth at bedtime. Patient not taking: Reported on 08/08/2019 10/15/18 08/08/19  [name], MD      Allergies    Robaxin [methocarbamol], Aspirin, Shellfish-derived products, Trazodone and nefazodone, Adhesive [tape], Latex, Toradol [ketorolac tromethamine], Contrast media [iodinated contrast media], Reglan [metoclopramide], and Salmon [fish oil]    Review of Systems   Review of Systems  Cardiovascular:  Positive for chest pain.    Physical Exam Updated Vital Signs BP (!) 127/109   Pulse (!) 126   Temp 97.8 F (36.6 C) (Temporal)   Resp (!) 22   SpO2 94%  Physical Exam Vitals and nursing note reviewed.  Constitutional:      General: He is not in acute distress.    Appearance: He is well-developed.  HENT:     Head: Normocephalic and atraumatic.  Eyes:     Extraocular Movements: Extraocular movements intact.  Cardiovascular:     Rate and Rhythm: Tachycardia present. Rhythm irregular.     Pulses:          Radial pulses are 2+ on the right side and 2+ on the left side.       Dorsalis pedis pulses are 2+ on the right side and 2+ on the left side.     Heart sounds: Normal heart sounds.  Pulmonary:     Effort: Pulmonary effort is normal.     Breath sounds: Normal breath sounds.  Chest:     Chest wall: No tenderness.  Abdominal:     Palpations: Abdomen is soft.     Tenderness: There is no abdominal tenderness.   Musculoskeletal:     Cervical back: Normal range of motion and neck supple.     Right lower leg: No edema.     Left lower leg: No edema.  Skin:    General: Skin is warm and dry.  Neurological:     General: No focal deficit present.     Mental Status: He is alert and oriented to person, place, and time.     Motor: No weakness.  Psychiatric:        Mood and Affect: Mood normal.        Behavior: Behavior normal.     ED Results /  Procedures / Treatments   Labs (all labs ordered are listed, but only abnormal results are displayed) Labs Reviewed  CBC WITH DIFFERENTIAL/PLATELET - Abnormal; Notable for the following components:      Result Value   Hemoglobin 11.8 (*)    HCT 36.3 (*)    RDW 23.0 (*)    All other components within normal limits  COMPREHENSIVE METABOLIC PANEL - Abnormal; Notable for the following components:   Chloride 113 (*)    CO2 18 (*)    Glucose, Bld 164 (*)    Calcium 7.3 (*)    AST 48 (*)    All other components within normal limits  BRAIN NATRIURETIC PEPTIDE - Abnormal; Notable for the following components:   B Natriuretic Peptide 116.7 (*)    All other components within normal limits  RAPID URINE DRUG SCREEN, HOSP PERFORMED - Abnormal; Notable for the following components:   Opiates POSITIVE (*)    All other components within normal limits  MAGNESIUM - Abnormal; Notable for the following components:   Magnesium 0.8 (*)    All other components within normal limits  I-STAT CHEM 8, ED - Abnormal; Notable for the following components:   Chloride 112 (*)    Creatinine, Ser 0.60 (*)    Glucose, Bld 161 (*)    Calcium, Ion 0.93 (*)    TCO2 18 (*)    All other components within normal limits  PROTIME-INR  LIPASE, BLOOD  TSH  TROPONIN I (HIGH SENSITIVITY)  TROPONIN I (HIGH SENSITIVITY)    EKG None  Radiology DG Chest Portable 1 View  Result Date: 01/20/2022 CLINICAL DATA:  Chest pain. EXAM: PORTABLE CHEST 1 VIEW COMPARISON:  Chest x-ray  12/05/2021 FINDINGS: Generator overlies the left chest. The heart size and mediastinal contours are within normal limits. Both lungs are clear. The visualized skeletal structures are unremarkable. IMPRESSION: No active disease. Electronically Signed   By: Ronney Asters M.D.   On: 01/20/2022 17:46    Procedures .Critical Care  Performed by: Ottie Glazier, DO Authorized by: Ottie Glazier, DO   Critical care provider statement:    Critical care time (minutes):  60   Critical care time was exclusive of:  Separately billable procedures and treating other patients   Critical care was necessary to treat or prevent imminent or life-threatening deterioration of the following conditions:  Cardiac failure   Critical care was time spent personally by me on the following activities:  Development of treatment plan with patient or surrogate, discussions with consultants, evaluation of patient's response to treatment, examination of patient, obtaining history from patient or surrogate, ordering and performing treatments and interventions, ordering and review of laboratory studies, ordering and review of radiographic studies, pulse oximetry and re-evaluation of patient's condition   Care discussed with: admitting provider     Care discussed with comment:  Cardiology     Medications Ordered in ED Medications  amiodarone (NEXTERONE PREMIX) 360-4.14 MG/200ML-% (1.8 mg/mL) IV infusion (60 mg/hr Intravenous Infusion Verify 01/20/22 1911)  amiodarone (NEXTERONE PREMIX) 360-4.14 MG/200ML-% (1.8 mg/mL) IV infusion (has no administration in time range)  metoprolol succinate (TOPROL-XL) 24 hr tablet 100 mg (has no administration in time range)  morphine (PF) 4 MG/ML injection 4 mg (4 mg Intravenous Given 01/20/22 1716)  metoprolol tartrate (LOPRESSOR) injection 5 mg (5 mg Intravenous Given 01/20/22 1703)  amiodarone (NEXTERONE) IV bolus only 150 mg/100 mL (0 mg Intravenous Stopped 01/20/22 1711)   diphenhydrAMINE (BENADRYL) injection 50 mg (50 mg Intravenous  Given 01/20/22 1742)  magnesium sulfate IVPB 2 g 50 mL (0 g Intravenous Stopped 01/20/22 1905)  acetaminophen (TYLENOL) tablet 650 mg (650 mg Oral Given 01/20/22 1930)  famotidine (PEPCID) tablet 20 mg (20 mg Oral Given 01/20/22 1930)  alum & mag hydroxide-simeth (MAALOX/MYLANTA) 200-200-20 MG/5ML suspension 30 mL (30 mLs Oral Given 01/20/22 1930)    ED Course/ Medical Decision Making/ A&P Clinical Course as of 01/20/22 2026  Thu Jan 20, 2022  1755 Patient found to be hypomagnesemic to 0.8 which may have induced his A-fib with RVR.  He will be given 2 g of IV magnesium.  Patient's potassium is within normal range at 3.9.  His heart rate is now in the 130s which is improved from his arrival.  He is currently on an amnio drip. [VK]  1826 I spoke with Dr. Radford Pax of cardiology who plans to evaluate the patient at bedside and recommends admission to hospitalist for further management. [VK]  2025 Cardiology recommended continuing amnio drip and resuming patient's home metoprolol.  He will be given p.o. dose here.  Patient was signed out to hospitalist. [VK]    Clinical Course User Index [VK] Ottie Glazier, DO                           Medical Decision Making This patient presents to the ED with chief complaint(s) of palpitations with pertinent past medical history of paroxysmal A-fib, substance use, hypertension, WPW status post ablation which further complicates the presenting complaint. The complaint involves an extensive differential diagnosis and also carries with it a high risk of complications and morbidity.    The differential diagnosis includes arrhythmia, ACS, electrolyte abnormality, intoxication, anemia, dehydration  Additional history obtained: Additional history obtained from EMS  Records reviewed previous admission documents and previous cardiology records  ED Course and Reassessment: Upon patient's arrival to the  emergency department he was tachycardic in the 180s to 190s and an irregularly irregular rhythm, concerning for A-fib with intermittent NSVT.  Immediately present at bedside upon the patient's arrival.  He was placed on pads and cardiac monitor.  He is hemodynamically stable despite his tachycardia.  Considered procainamide in the patient's history of WPW but due to a delay of obtaining procainamide from pharmacy, decision was to load the patient with Amio with 150 mg and he was started on amnio drip.  After reviewing the patient's documentations and previous cardiology notes, it is found that he is prescribed metoprolol and he was given 5 mg of IV Lopressor.  His heart rate improved to the 110s- 130s  Independent labs interpretation:  The following labs were independently interpreted: Hypomagnesemia of 0.8  Independent visualization of imaging: - I independently visualized the following imaging with scope of interpretation limited to determining acute life threatening conditions related to emergency care: Chest x-ray, which revealed no acute disease  Consultation: - Consulted or discussed management/test interpretation w/ external professional: Cardiology, hospitalist  Consideration for admission or further workup: Requires admission for further management of his A-fib with RVR and severe hypomagnesemia Social Determinants of health: Substance use    Amount and/or Complexity of Data Reviewed Labs: ordered. Radiology: ordered.  Risk OTC drugs. Prescription drug management. Decision regarding hospitalization.           Final Clinical Impression(s) / ED Diagnoses Final diagnoses:  Atrial fibrillation with rapid ventricular response ([place])  Hypomagnesemia    Rx / DC Orders ED Discharge Orders  None         Ottie Glazier, DO 01/20/22 2026

## 2022-01-20 NOTE — H&P (Addendum)
History and Physical    [name] IRW:[id number] DOB: [birth year] DOA: 01/20/2022  PCP: Pcp, No  Patient coming from: Home  Chief Complaint: Chest pain, palpitations  HPI: [name] is a 51 y.o. male with medical history significant of paroxysmal A-fib not on anticoagulation, Wolff-Parkinson-White syndrome status post ablation (per patient reports), alcohol use disorder, cocaine use disorder, GERD, anxiety/depression, bipolar disorder, arthritis, asthma, chronic bronchitis, chronic pain/narcotic seeking behavior, chronic alcoholic pancreatitis, hyperlipidemia, hypertension presented to the ED with chest pain and palpitations.  When medics arrived his heart rate was in the 200-220s.  They suspected SVT and gave the patient adenosine 6 mg followed by 12 mg, again followed by another 12 mg without any improvement.  Attempted vagal maneuvers without any improvement.  Upon arrival to the ED, patient noted to be in A-fib with RVR, rate in the 180s to 190s with intermittent NSVT.  He was given IV amiodarone 150 mg and started on continuous infusion.  He was also given IV Lopressor 5 mg.  Heart rate subsequently improved.  Labs showing no leukocytosis, hemoglobin 11.8 (stable), magnesium 0.8, potassium 3.9, bicarb 18, anion gap 11, glucose 164, calcium 7.3, albumin 3.7, ionized calcium 0.93, BNP 116, initial high-sensitivity troponin normal and repeat pending, UDS positive for opiates, TSH pending.  Chest x-ray showing no active disease. Other medications administered in the ED include Tylenol, Maalox, Benadryl, Pepcid, morphine, and IV magnesium 2 g.  Cardiology consulted.  Patient states his heart has been racing since yesterday.  He is taking metoprolol at home and states his cardiologist had recently increased the dose and he had Zio patch placed.  Last dose of metoprolol was this morning.  Today when he went to use the bathroom in the afternoon, he felt dizzy and short of breath.  He  vomited last night.  Today he started having severe epigastric/substernal chest pain today after eating 3 hotdogs for lunch.  He has felt nauseous today and also having nonbloody diarrhea.  He reports history of pancreatitis and thinks he might be having it again.  Reports drinking 1 to 2 40 ounce cans of beer daily, last drink was 2 days ago.  States he has not used cocaine for the past 1 month.  He reports improvement of his chest pain since after receiving medications in the ED but states he is still having a lot of abdominal pain and requesting Dilaudid.  Review of Systems:  Review of Systems  All other systems reviewed and are negative.   Past Medical History:  Diagnosis Date   Alcoholism /alcohol abuse    Anemia    Anxiety    Arthritis    "knees; arms; elbows" (03/26/2015)   Asthma    Bipolar disorder (HCC)    Chronic bronchitis (HCC)    Chronic lower back pain    Chronic pancreatitis ([place])    Cocaine abuse ([place])    Depression    Family history of adverse reaction to anesthesia    "grandmother gets confused"   Femoral condyle fracture ([place]) 03/08/2014   left medial/notes 03/09/2014   GERD (gastroesophageal reflux disease)    H/O hiatal hernia    H/O suicide attempt 10/2012   High cholesterol    History of blood transfusion 10/2012   "when I tried to commit suicide"   History of stomach ulcers    Hypertension    Marijuana abuse, continuous    Migraine    "a few times/year" (03/26/2015)   Pneumonia 1990's X 3  PTSD (post-traumatic stress disorder)    Sickle cell trait ([place])    WPW (Wolff-Parkinson-White syndrome)    [name] 03/06/2013    Past Surgical History:  Procedure Laterality Date   BIOPSY  11/25/2017   Procedure: BIOPSY;  Surgeon: Arta Silence, MD;  Location: [facility] ENDOSCOPY;  Service: Endoscopy;;   BIOPSY  10/14/2018   Procedure: BIOPSY;  Surgeon: Arta Silence, MD;  Location: [place];  Service: Endoscopy;;   CARDIAC CATHETERIZATION     CYST ENTEROSTOMY   01/02/2020   Procedure: CYST ASPIRATION;  Surgeon: Milus Banister, MD;  Location: [facility] ENDOSCOPY;  Service: Endoscopy;;   ESOPHAGOGASTRODUODENOSCOPY (EGD) WITH PROPOFOL N/A 11/25/2017   Procedure: ESOPHAGOGASTRODUODENOSCOPY (EGD) WITH PROPOFOL;  Surgeon: Arta Silence, MD;  Location: [facility] ENDOSCOPY;  Service: Endoscopy;  Laterality: N/A;   ESOPHAGOGASTRODUODENOSCOPY (EGD) WITH PROPOFOL Left 10/14/2018   Procedure: ESOPHAGOGASTRODUODENOSCOPY (EGD) WITH PROPOFOL;  Surgeon: Arta Silence, MD;  Location: [facility] ENDOSCOPY;  Service: Endoscopy;  Laterality: Left;   ESOPHAGOGASTRODUODENOSCOPY (EGD) WITH PROPOFOL N/A 11/14/2018   Procedure: ESOPHAGOGASTRODUODENOSCOPY (EGD) WITH PROPOFOL;  Surgeon: Laurence Spates, MD;  Location: [facility] ENDOSCOPY;  Service: Gastroenterology;  Laterality: N/A;   ESOPHAGOGASTRODUODENOSCOPY (EGD) WITH PROPOFOL N/A 01/02/2020   Procedure: ESOPHAGOGASTRODUODENOSCOPY (EGD) WITH PROPOFOL;  Surgeon: Milus Banister, MD;  Location: [facility] ENDOSCOPY;  Service: Endoscopy;  Laterality: N/A;   ESOPHAGOGASTRODUODENOSCOPY (EGD) WITH PROPOFOL N/A 10/25/2020   Procedure: ESOPHAGOGASTRODUODENOSCOPY (EGD) WITH PROPOFOL;  Surgeon: Rush Landmark Telford Nab., MD;  Location: [place];  Service: Gastroenterology;  Laterality: N/A;   EUS N/A 01/02/2020   Procedure: UPPER ENDOSCOPIC ULTRASOUND (EUS) RADIAL;  Surgeon: Milus Banister, MD;  Location: [facility] ENDOSCOPY;  Service: Endoscopy;  Laterality: N/A;   EYE SURGERY Left 1990's   "result of trauma"    FACIAL FRACTURE SURGERY Left 1990's   "result of trauma"    FLEXIBLE SIGMOIDOSCOPY N/A 10/25/2020   Procedure: FLEXIBLE SIGMOIDOSCOPY;  Surgeon: Irving Copas., MD;  Location: [place];  Service: Gastroenterology;  Laterality: N/A;   FRACTURE SURGERY     HEMOSTASIS CLIP PLACEMENT  10/25/2020   Procedure: HEMOSTASIS CLIP PLACEMENT;  Surgeon: Irving Copas., MD;  Location: [employer];  Service: Gastroenterology;;   HERNIA REPAIR     LEFT HEART  CATHETERIZATION WITH CORONARY ANGIOGRAM Right 03/07/2013   Procedure: LEFT HEART CATHETERIZATION WITH CORONARY ANGIOGRAM;  Surgeon: [name], MD;  Location: [place] CATH LAB;  Service: Cardiovascular;  Laterality: Right;   UMBILICAL HERNIA REPAIR     UPPER GASTROINTESTINAL ENDOSCOPY       reports that he has been smoking cigarettes and e-cigarettes. He has a 36.00 pack-year smoking history. He has never used smokeless tobacco. He reports that he does not currently use alcohol. He reports that he does not currently use drugs after having used the following drugs: Marijuana and Cocaine.  Allergies  Allergen Reactions   Robaxin [Methocarbamol] Other (See Comments)    "jumpy limbs"   Aspirin Other (See Comments)    Unknown reaction   Shellfish-Derived Products Nausea And Vomiting and Rash   Trazodone And Nefazodone Other (See Comments)    Muscle spasms   Adhesive [Tape] Itching   Latex Itching   Toradol [Ketorolac Tromethamine] Other (See Comments)    Has ulcers; cannot have this   Contrast Media [Iodinated Contrast Media] Hives   Reglan [Metoclopramide] Other (See Comments)    Muscle spasms   Salmon [Fish Oil] Nausea And Vomiting and Rash    Family History  Problem Relation Age of Onset   Hypertension Mother  Cirrhosis Father    Alcoholism Father    Hypertension Father    Melanoma Father    Hypertension Other    Coronary artery disease Other     Prior to Admission medications   Medication Sig Start Date End Date Taking? Authorizing Provider  acetaminophen (TYLENOL) 500 MG tablet Take 500 mg by mouth every 6 (six) hours as needed for moderate pain or headache.    [provider]  albuterol (VENTOLIN HFA) 108 (90 Base) MCG/ACT inhaler Inhale 2 puffs into the lungs every 4 (four) hours as needed for wheezing or shortness of breath. 08/17/21   [name], MD  amLODipine (NORVASC) 10 MG tablet Take 1 tablet (10 mg total) by mouth daily. Patient not taking:  Reported on 01/06/2022 12/25/21   [name], MD  folic acid (FOLVITE) 1 MG tablet Take 1 tablet (1 mg total) by mouth daily. 11/26/17   [name], MD  HYDROcodone-acetaminophen (NORCO/VICODIN) 5-325 MG tablet Take 1 tablet by mouth every 12 (twelve) hours as needed for moderate pain. 12/24/21   [name], MD  lidocaine (LMX) 4 % cream Apply topically 4 (four) times daily as needed (NGT pain). 12/24/21   [name], MD  lipase/protease/amylase (CREON) 36000 UNITS CPEP capsule Take 2 capsules (72,000 Units total) by mouth 3 (three) times daily with meals. 12/24/21   [name], MD  magnesium oxide (MAG-OX) 400 (240 Mg) MG tablet Take 1 tablet (400 mg total) by mouth daily. 11/30/21   Davonna Belling, MD  metoprolol succinate (TOPROL XL) 25 MG 24 hr tablet Take 1 tablet (25 mg total) by mouth as needed (for palpitations). To not exceed 1 dose per day. 01/06/22   [facility], Tami Lin, PA  metoprolol succinate (TOPROL-XL) 100 MG 24 hr tablet Take 1 tablet (100 mg total) by mouth daily. Take with or immediately following a meal. 12/25/21   [name], MD  Multiple Vitamin (MULTIVITAMIN WITH MINERALS) TABS tablet Take 1 tablet by mouth daily. 09/06/17   Bonnell Public, MD  nicotine (NICODERM CQ - DOSED IN MG/24 HOURS) 21 mg/24hr patch Place 1 patch (21 mg total) onto the skin at bedtime. Patient not taking: Reported on 01/06/2022 12/24/21   [name], MD  nitroGLYCERIN (NITROSTAT) 0.4 MG SL tablet Place 1 tablet (0.4 mg total) under the tongue every 5 (five) minutes as needed for chest pain. Do not exceed 3 doses in a row. 01/06/22   [facility], Tami Lin, PA  ondansetron (ZOFRAN) 4 MG tablet Take 1 tablet (4 mg total) by mouth every 6 (six) hours. 11/03/21   Sherrell Puller, PA-C  pantoprazole (PROTONIX) 40 MG tablet Take 1 tablet (40 mg total) by mouth 2 (two) times daily. 12/24/21   [name], MD  Polyethyl Glycol-Propyl Glycol (SYSTANE OP) Place 1 drop into  both eyes 4 (four) times daily as needed (for dryness).    [provider]  sucralfate (CARAFATE) 1 g tablet Take 1 tablet (1 g total) by mouth 4 (four) times daily -  with meals and at bedtime. 10/12/21   Willia Craze, NP  thiamine 100 MG tablet Take 1 tablet (100 mg total) by mouth daily. 10/16/18   [name], MD  Vitamin D, Ergocalciferol, (DRISDOL) 1.25 MG (50000 UNIT) CAPS capsule Take 50,000 Units by mouth every Wednesday.    [provider]  amitriptyline (ELAVIL) 25 MG tablet Take 1 tablet (25 mg total) by mouth at bedtime. Patient not  taking: Reported on 08/08/2019 10/15/18 08/08/19  [name], MD    Physical Exam: Vitals:   01/20/22 1815 01/20/22 1830 01/20/22 1845 01/20/22 1915  BP: (!) 127/109 (!) 125/103  (!) 124/97  Pulse: (!) 126 (!) 53 (!) 149 (!) 129  Resp: (!) '28 20 20 '$ (!) 23  Temp: 97.8 F (36.6 C)     TempSrc: Temporal     SpO2: 97% 99% 99% 96%    Physical Exam Vitals reviewed.  Constitutional:      General: He is not in acute distress. HENT:     Head: Normocephalic and atraumatic.  Eyes:     Extraocular Movements: Extraocular movements intact.  Cardiovascular:     Rate and Rhythm: Tachycardia present. Rhythm irregular.     Pulses: Normal pulses.  Pulmonary:     Effort: Pulmonary effort is normal. No respiratory distress.     Breath sounds: Normal breath sounds. No wheezing or rales.  Abdominal:     General: There is no distension.     Palpations: Abdomen is soft.     Tenderness: There is abdominal tenderness. There is guarding.     Comments: Hyperactive bowel sounds Epigastrium tender to palpation with guarding  Musculoskeletal:        General: No swelling or tenderness.     Cervical back: Normal range of motion.  Skin:    General: Skin is warm and dry.  Neurological:     General: No focal deficit present.     Mental Status: He is alert and oriented to person, place, and time.      Labs on Admission: I have personally  reviewed following labs and imaging studies  CBC: Recent Labs  Lab 01/20/22 1705 01/20/22 1709  WBC  --  8.1  NEUTROABS  --  3.4  HGB 14.3 11.8*  HCT 42.0 36.3*  MCV  --  85.4  PLT  --  300   Basic Metabolic Panel: Recent Labs  Lab 01/20/22 1705 01/20/22 1709  NA 144 142  K 3.8 3.9  CL 112* 113*  CO2  --  18*  GLUCOSE 161* 164*  BUN 7 8  CREATININE 0.60* 0.77  CALCIUM  --  7.3*  MG  --  0.8*   GFR: Estimated Creatinine Clearance: 83.9 mL/min (by C-G formula based on SCr of 0.77 mg/dL). Liver Function Tests: Recent Labs  Lab 01/20/22 1709  AST 48*  ALT 40  ALKPHOS 68  BILITOT 0.3  PROT 6.5  ALBUMIN 3.7   Recent Labs  Lab 01/20/22 1709  LIPASE 24   No results for input(s): "AMMONIA" in the last 168 hours. Coagulation Profile: Recent Labs  Lab 01/20/22 1709  INR 0.9   Cardiac Enzymes: No results for input(s): "CKTOTAL", "CKMB", "CKMBINDEX", "TROPONINI" in the last 168 hours. BNP (last 3 results) No results for input(s): "PROBNP" in the last 8760 hours. HbA1C: No results for input(s): "HGBA1C" in the last 72 hours. CBG: No results for input(s): "GLUCAP" in the last 168 hours. Lipid Profile: No results for input(s): "CHOL", "HDL", "LDLCALC", "TRIG", "CHOLHDL", "LDLDIRECT" in the last 72 hours. Thyroid Function Tests: No results for input(s): "TSH", "T4TOTAL", "FREET4", "T3FREE", "THYROIDAB" in the last 72 hours. Anemia Panel: No results for input(s): "VITAMINB12", "FOLATE", "FERRITIN", "TIBC", "IRON", "RETICCTPCT" in the last 72 hours. Urine analysis:    Component Value Date/Time   COLORURINE YELLOW 08/14/2021 1455   APPEARANCEUR CLEAR 08/14/2021 1455   LABSPEC 1.011 08/14/2021 1455   PHURINE 5.0 08/14/2021 1455  GLUCOSEU NEGATIVE 08/14/2021 1455   HGBUR NEGATIVE 08/14/2021 1455   HGBUR negative 04/30/2010 1020   BILIRUBINUR NEGATIVE 08/14/2021 1455   BILIRUBINUR negative 09/06/2019 1649   KETONESUR NEGATIVE 08/14/2021 1455   PROTEINUR  NEGATIVE 08/14/2021 1455   UROBILINOGEN 0.2 09/06/2019 1649   UROBILINOGEN 0.2 03/15/2015 0508   NITRITE NEGATIVE 08/14/2021 1455   LEUKOCYTESUR NEGATIVE 08/14/2021 1455    Radiological Exams on Admission: I have personally reviewed images DG Chest Portable 1 View  Result Date: 01/20/2022 CLINICAL DATA:  Chest pain. EXAM: PORTABLE CHEST 1 VIEW COMPARISON:  Chest x-ray 12/05/2021 FINDINGS: Generator overlies the left chest. The heart size and mediastinal contours are within normal limits. Both lungs are clear. The visualized skeletal structures are unremarkable. IMPRESSION: No active disease. Electronically Signed   By: Ronney Asters M.D.   On: 01/20/2022 17:46    EKG: Independently reviewed.  A-fib with RVR, PVCs.  Assessment and Plan  Paroxysmal A-fib with RVR NSVT Chest pain History of Wolff-Parkinson-White syndrome status post ablation (per patient reports) Systolic dysfunction A-fib with RVR likely precipitated by alcohol use and severe hypomagnesemia.  UDS negative for cocaine.  TSH normal.  Rate has now improved to 120s-130s after patient received IV Lopressor 5 mg and was started on IV amiodarone bolus plus infusion.  High-sensitivity troponin negative x2 and not consistent with ACS.  Nuclear medicine stress test done 01/12/2022 was an intermediate risk study - no reversible ischemia, LVEF 47% with global hypokinesis. -Cardiology consulted, appreciate recommendations.  Plan for EP consult in the morning. -Cardiac monitoring -Continue amiodarone infusion -Resume home metoprolol after pharmacy med rec is done. -No anticoagulation given low CHA2DS2-VASc score of 1 -Echocardiogram -Replace electrolytes, keep mag >2 and K >4  Severe epigastric abdominal pain Lipase and LFTs normal.  Patient has documented history of narcotic seeking behavior and is requesting Dilaudid.  Refusing nonopiate drugs. -Morphine 1 mg x 1 ordered -Stat CT abdomen pelvis ordered  Addendum 01/20/2022 at 9:18  PM: Lipase normal, AST borderline elevated at 48 with normal remainder of LFTs.  Severe hypomagnesemia -Replace magnesium with goal to keep level >2  Mild normal anion gap metabolic acidosis Likely due to diarrhea. -Gentle IV fluid hydration and monitor bicarb level closely  Hypocalcemia -Replace calcium and continue to monitor  Alcohol use disorder Last drink was 2 days ago, currently tachycardic but not displaying any other signs of withdrawal. -CIWA protocol; Ativan as needed -Thiamine, folate, multivitamin  Hypertension GERD Anxiety/depression/bipolar disorder Asthma: Stable, no signs of acute exacerbation. Hyperlipidemia -Pharmacy med rec pending.  DVT prophylaxis: SCDs (CT abdomen pelvis pending) Code Status: Full Code (discussed with the patient) Family Communication: No family available at this time. Consults called: Cardiology Level of care: Progressive Care Unit Admission status: It is my clinical opinion that referral for OBSERVATION is reasonable and necessary in this patient based on the above information provided. The aforementioned taken together are felt to place the patient at high risk for further clinical deterioration. However, it is anticipated that the patient may be medically stable for discharge from the hospital within 24 to 48 hours.   Shela Leff MD [facility]  If 7PM-7AM, please contact night-coverage [email or web address]  01/20/2022, 7:35 PM

## 2022-01-20 NOTE — ED Triage Notes (Signed)
Pt herevia [facility] from home for palpitations and cp x2 hours. Upon EMS arrival pt in afib rvr 190s-220s, EMS gave adenosine ('6mg'$ , then '12mg'$  then '12mg'$ ) w/o relief, also attempted vagal maneuvers w/o relief. Pt c/o 8/10 cp, EMS gave '4mg'$  morphine. 142/100, 180HR, 96% RA. 18g posterior LFA, approx 334m NS given.

## 2022-01-20 NOTE — Consult Note (Addendum)
Cardiology Consultation   Patient ID: [name] MRN: [id number]; DOB: [birth year]  Admit date: 01/20/2022 Date of Consult: 01/20/2022  PCP:  Kathyrn Lass   [facility] Providers Cardiologist:  Minus Breeding, MD  Cardiology APP:  Ledora Bottcher, PA  {   Patient Profile:   [name] is a 51 y.o. male with a hx of HTN, polysubstance abuse (cocaine, opiates, marijuana), alcohol abuse, chronic pancreatitis,  WPW syndrome s/p ablation (self reports) , paroxysmal A fib, chronic pain, who is being seen 01/20/2022 for the evaluation of A fib RVR at the request of Dr Ernesto Rutherford.  History of Present Illness:   Mr. Cwynar follows Dr Percival Spanish outpatient, per previous cardiology notes, reportedly had no cardiac disease in 2014 cardiac cath that was done by Dr. Doylene Canard. He has multiple ER visit for chest and abdominal pain, is in the process trying to get to pain clinic, but has UDS consistently positive for opiates and cocaine in the past.  EKG in 2014 showed preexcitation with ST segment changes concerning for WPW (not seen). He self reports that he had  ablation in 2014 or 2015 at [facility], but there is no EP records of this.  He was recently hospitalized here in 12/2021 where cardiology was consulted for A fib RVR. He had complaints of heart palpitation, abdominal/chest pain, as well as diarrhea and anorexia. Hs trops were negative. He was noted with A fib RVR with some aberrant conduction on monitor, had converted spontaneously, reports out of Metprolol XL for 1 month. He had stopped cocaine use for 6 weeks, uses Marijuana, UDS was pan negative on 12/06/21 . Echo from 12/07/21 showed LVEF 65-70%, no RWMA, grade I DD, normal RV, normal PASP, no valvular disease.  He was resumed on Metoprolol XL '100mg'$  daily, no anticoagulation was started given CHA2DS2VASc score is 1 for HTN.   He followed up with APP on 01/06/22 in the office, had complained chest pain with exertion that is  progressing over the past few months.  He was arranged for NM stress myoview. For palpitation, he was placed on heart monitor and told to take additional metoprolol. NM stress myoview from 01/12/22 showed intermediate risk study with no ischemia, LVEF 47% with global hypokinesis.  He returned to ER today c/o chest pain, heart palpitation, and racing heart rate. He state he had intermittent heart palpitation since recent discharge. He noted racing heart rate 4am today with chest pain and called EMS. He was reported with HR 200-220s by EMS. He was given adenosine '6mg'$ >'12mg'$ >'12mg'$  as well as vagal maneuvers , with no improvement. He has been taking his metoprolol. He denied cocaine use, used Marijuana 1 week ago, takes hydrocodone from pain clinic. He drinks ETOH, had 40oz beer yesterday. He wants ativan for withdrawal symptoms.   Admission diagnostic showed bicarb 18, otherwsie unremarkable CMP. Mag was 0.8. Lipase 24. Hs trop 4. CBC with Hgb 11.8. UDS + opiates. CXR no acute findings. EKG serials showed A fib RVR, VR up to 180s, occasional PVCs. He is HD stable at ED besides tachycardia. He was given metoprolol '5mg'$ , morphine '4mg'$ , Mag sulfate 2g, benadryl '50mg'$ , and started on amiodarone gtt at ED. Cardiology is consulted for further input.       Past Medical History:  Diagnosis Date   Alcoholism /alcohol abuse    Anemia    Anxiety    Arthritis    "knees; arms; elbows" (03/26/2015)   Asthma    Bipolar disorder ([place])  Chronic bronchitis (HCC)    Chronic lower back pain    Chronic pancreatitis ([place])    Cocaine abuse ([place])    Depression    Family history of adverse reaction to anesthesia    "grandmother gets confused"   Femoral condyle fracture ([place]) 03/08/2014   left medial/notes 03/09/2014   GERD (gastroesophageal reflux disease)    H/O hiatal hernia    H/O suicide attempt 10/2012   High cholesterol    History of blood transfusion 10/2012   "when I tried to commit suicide"   History of  stomach ulcers    Hypertension    Marijuana abuse, continuous    Migraine    "a few times/year" (03/26/2015)   Pneumonia 1990's X 3   PTSD (post-traumatic stress disorder)    Sickle cell trait ([place])    WPW (Wolff-Parkinson-White syndrome)    [name] 03/06/2013    Past Surgical History:  Procedure Laterality Date   BIOPSY  11/25/2017   Procedure: BIOPSY;  Surgeon: Arta Silence, MD;  Location: [place];  Service: Endoscopy;;   BIOPSY  10/14/2018   Procedure: BIOPSY;  Surgeon: Arta Silence, MD;  Location: [place];  Service: Endoscopy;;   CARDIAC CATHETERIZATION     CYST ENTEROSTOMY  01/02/2020   Procedure: CYST ASPIRATION;  Surgeon: Milus Banister, MD;  Location: [facility];  Service: Endoscopy;;   ESOPHAGOGASTRODUODENOSCOPY (EGD) WITH PROPOFOL N/A 11/25/2017   Procedure: ESOPHAGOGASTRODUODENOSCOPY (EGD) WITH PROPOFOL;  Surgeon: Arta Silence, MD;  Location: [facility];  Service: Endoscopy;  Laterality: N/A;   ESOPHAGOGASTRODUODENOSCOPY (EGD) WITH PROPOFOL Left 10/14/2018   Procedure: ESOPHAGOGASTRODUODENOSCOPY (EGD) WITH PROPOFOL;  Surgeon: Arta Silence, MD;  Location: [facility] END[facility]COPY;  Service: Endoscopy;  Laterality: Left;   ESOPHAGOGASTRODUODENOSCOPY (EGD) WITH PROPOFOL N/A 11/14/2018   Procedure: ESOPHAGOGASTRODUODENOSCOPY (EGD) WITH PROPOFOL;  Surgeon: Laurence Spates, MD;  Location: [facility];  Service: Gastroenterology;  Laterality: N/A;   ESOPHAGOGASTRODUODENOSCOPY (EGD) WITH PROPOFOL N/A 01/02/2020   Procedure: ESOPHAGOGASTRODUODENOSCOPY (EGD) WITH PROPOFOL;  Surgeon: Milus Banister, MD;  Location: [facility];  Service: Endoscopy;  Laterality: N/A;   ESOPHAGOGASTRODUODENOSCOPY (EGD) WITH PROPOFOL N/A 10/25/2020   Procedure: ESOPHAGOGASTRODUODENOSCOPY (EGD) WITH PROPOFOL;  Surgeon: Rush Landmark Telford Nab., MD;  Location: [place];  Service: Gastroenterology;  Laterality: N/A;   EUS N/A 01/02/2020   Procedure: UPPER ENDOSCOPIC ULTRASOUND (EUS) RADIAL;   Surgeon: Milus Banister, MD;  Location: [facility];  Service: Endoscopy;  Laterality: N/A;   EYE SURGERY Left 1990's   "result of trauma"    FACIAL FRACTURE SURGERY Left 1990's   "result of trauma"    FLEXIBLE SIGMOIDOSCOPY N/A 10/25/2020   Procedure: FLEXIBLE SIGMOIDOSCOPY;  Surgeon: Irving Copas., MD;  Location: [place];  Service: Gastroenterology;  Laterality: N/A;   FRACTURE SURGERY     HEMOSTASIS CLIP PLACEMENT  10/25/2020   Procedure: HEMOSTASIS CLIP PLACEMENT;  Surgeon: Irving Copas., MD;  Location: [place];  Service: Gastroenterology;;   HERNIA REPAIR     LEFT HEART CATHETERIZATION WITH CORONARY ANGIOGRAM Right 03/07/2013   Procedure: LEFT HEART CATHETERIZATION WITH CORONARY ANGIOGRAM;  Surgeon: [name], MD;  Location: [place] C[facility];  Service: Cardiovascular;  Laterality: Right;   UMBILICAL HERNIA REPAIR     UPPER GASTROINTESTINAL ENDOSCOPY       Home Medications:  Prior to Admission medications   Medication Sig Start Date End Date Taking? Authorizing Provider  acetaminophen (TYLENOL) 500 MG tablet Take 500 mg by mouth every 6 (six) hours as needed for moderate pain or headache.  [provider]  albuterol (VENTOLIN HFA) 108 (90 Base) MCG/ACT inhaler Inhale 2 puffs into the lungs every 4 (four) hours as needed for wheezing or shortness of breath. 08/17/21   [name], MD  amLODipine (NORVASC) 10 MG tablet Take 1 tablet (10 mg total) by mouth daily. Patient not taking: Reported on 01/06/2022 12/25/21   [name], MD  folic acid (FOLVITE) 1 MG tablet Take 1 tablet (1 mg total) by mouth daily. 11/26/17   [name], MD  HYDROcodone-acetaminophen (NORCO/VICODIN) 5-325 MG tablet Take 1 tablet by mouth every 12 (twelve) hours as needed for moderate pain. 12/24/21   [name], MD  lidocaine (LMX) 4 % cream Apply topically 4 (four) times daily as needed (NGT pain). 12/24/21   [name], MD   lipase/protease/amylase (CREON) 36000 UNITS CPEP capsule Take 2 capsules (72,000 Units total) by mouth 3 (three) times daily with meals. 12/24/21   [name], MD  magnesium oxide (MAG-OX) 400 (240 Mg) MG tablet Take 1 tablet (400 mg total) by mouth daily. 11/30/21   Davonna Belling, MD  metoprolol succinate (TOPROL XL) 25 MG 24 hr tablet Take 1 tablet (25 mg total) by mouth as needed (for palpitations). To not exceed 1 dose per day. 01/06/22   [facility], Tami Lin, PA  metoprolol succinate (TOPROL-XL) 100 MG 24 hr tablet Take 1 tablet (100 mg total) by mouth daily. Take with or immediately following a meal. 12/25/21   [name], MD  Multiple Vitamin (MULTIVITAMIN WITH MINERALS) TABS tablet Take 1 tablet by mouth daily. 09/06/17   Bonnell Public, MD  nicotine (NICODERM CQ - DOSED IN MG/24 HOURS) 21 mg/24hr patch Place 1 patch (21 mg total) onto the skin at bedtime. Patient not taking: Reported on 01/06/2022 12/24/21   [name], MD  nitroGLYCERIN (NITROSTAT) 0.4 MG SL tablet Place 1 tablet (0.4 mg total) under the tongue every 5 (five) minutes as needed for chest pain. Do not exceed 3 doses in a row. 01/06/22   [facility], Tami Lin, PA  ondansetron (ZOFRAN) 4 MG tablet Take 1 tablet (4 mg total) by mouth every 6 (six) hours. 11/03/21   Sherrell Puller, PA-C  pantoprazole (PROTONIX) 40 MG tablet Take 1 tablet (40 mg total) by mouth 2 (two) times daily. 12/24/21   [name], MD  Polyethyl Glycol-Propyl Glycol (SYSTANE OP) Place 1 drop into both eyes 4 (four) times daily as needed (for dryness).    [provider]  sucralfate (CARAFATE) 1 g tablet Take 1 tablet (1 g total) by mouth 4 (four) times daily -  with meals and at bedtime. 10/12/21   Willia Craze, NP  thiamine 100 MG tablet Take 1 tablet (100 mg total) by mouth daily. 10/16/18   [name], MD  Vitamin D, Ergocalciferol, (DRISDOL) 1.25 MG (50000 UNIT) CAPS capsule Take 50,000 Units by mouth every Wednesday.     [provider]  amitriptyline (ELAVIL) 25 MG tablet Take 1 tablet (25 mg total) by mouth at bedtime. Patient not taking: Reported on 08/08/2019 10/15/18 08/08/19  [name], MD    Inpatient Medications: Scheduled Meds:  Continuous Infusions:  amiodarone 60 mg/hr (01/20/22 1911)   amiodarone     PRN Meds:   Allergies:    Allergies  Allergen Reactions   Robaxin [Methocarbamol] Other (See Comments)    "jumpy limbs"   Aspirin Other (See Comments)    Unknown reaction   Shellfish-Derived Products  Nausea And Vomiting and Rash   Trazodone And Nefazodone Other (See Comments)    Muscle spasms   Adhesive [Tape] Itching   Latex Itching   Toradol [Ketorolac Tromethamine] Other (See Comments)    Has ulcers; cannot have this   Contrast Media [Iodinated Contrast Media] Hives   Reglan [Metoclopramide] Other (See Comments)    Muscle spasms   Salmon [Fish Oil] Nausea And Vomiting and Rash    Social History:   Social History   Socioeconomic History   Marital status: Single    Spouse name: Not on file   Number of children: Not on file   Years of education: Not on file   Highest education level: Not on file  Occupational History   Occupation: disabled  Tobacco Use   Smoking status: Every Day    Packs/day: 1.00    Years: 36.00    Total pack years: 36.00    Types: Cigarettes, E-cigarettes   Smokeless tobacco: Never  Vaping Use   Vaping Use: Former  Substance and Sexual Activity   Alcohol use: Not Currently    Comment: last drink a few days ago   Drug use: Not Currently    Types: Marijuana, Cocaine   Sexual activity: Yes    Birth control/protection: None  Other Topics Concern   Not on file  Social History Narrative   ** Merged History Encounter **       Social Determinants of Health   Financial Resource Strain: Not on file  Food Insecurity: Not on file  Transportation Needs: Not on file  Physical Activity: Not on file  Stress: Not on file  Social  Connections: Not on file  Intimate Partner Violence: Not on file    Family History:    Family History  Problem Relation Age of Onset   Hypertension Mother    Cirrhosis Father    Alcoholism Father    Hypertension Father    Melanoma Father    Hypertension Other    Coronary artery disease Other      ROS:  Constitutional: Denied fever, chills, malaise, night sweats Eyes: Denied vision change or loss Ears/Nose/Mouth/Throat: Denied ear ache, sore throat, coughing, sinus pain Cardiovascular: see HPI  Respiratory: see HPI  Gastrointestinal: pancreatitis  Genital/Urinary: Denied dysuria, hematuria, urinary frequency/urgency Musculoskeletal: Denied muscle ache, joint pain, weakness Skin: Denied rash, wound Neuro: Denied headache, dizziness, syncope Psych: Denied history of depression/anxiety  Endocrine: Denied history of diabetes   Physical Exam/Data:   Vitals:   01/20/22 1815 01/20/22 1830 01/20/22 1845 01/20/22 1915  BP: (!) 127/109 (!) 125/103  (!) 124/97  Pulse: (!) 126 (!) 53 (!) 149 (!) 129  Resp: (!) '28 20 20 '$ (!) 23  Temp: 97.8 F (36.6 C)     TempSrc: Temporal     SpO2: 97% 99% 99% 96%    Intake/Output Summary (Last 24 hours) at 01/20/2022 1941 Last data filed at 01/20/2022 1911 Gross per 24 hour  Intake 208.27 ml  Output --  Net 208.27 ml      01/11/2022   10:09 AM 01/06/2022   10:16 AM 12/24/2021    5:12 AM  Last 3 Weights  Weight (lbs) 121 lb 121 lb 9.6 oz 117 lb 4.6 oz  Weight (kg) 54.885 kg 55.157 kg 53.2 kg     There is no height or weight on file to calculate BMI.   Vitals:  Vitals:   01/20/22 1845 01/20/22 1915  BP:  (!) 124/97  Pulse: (!) 149 (!) 129  Resp: 20 (!) 23  Temp:    SpO2: 99% 96%   General Appearance: In no apparent distress, laying in bed, thin  HEENT: Normocephalic, atraumatic.  Neck: Supple, trachea midline, no JVDs Cardiovascular: Irregularly irregular, no murmur  Respiratory: Resting breathing unlabored, lungs sounds clear to  auscultation bilaterally, no use of accessory muscles. On room air.  No wheezes, rales or rhonchi.   Gastrointestinal: Bowel sounds positive, abdomen soft Extremities: Able to move all extremities in bed without difficulty, no edema Musculoskeletal: Normal muscle bulk and tone Skin: Intact, warm, dry. No rashes or petechiae noted in exposed areas.  Neurologic: Alert, oriented to person, place and time. Fluent speech, no facial droop, no cognitive deficit Psychiatric: Normal affect. Mood is appropriate.         EKG:  The EKG was personally reviewed and demonstrates:    EKG from 01/20/22 1649 A fib RVR 183 bpm, PVCs, inferolateral STD old EKG from 01/20/22  1659 A fib RVR 139 bpm, PVCs, inferolateral STD old EKG from 01/20/22 1804  A fib RVR 138bpm, PVCs, improved old inferolateral STD   Telemetry:  Telemetry was personally reviewed and demonstrates:    A fib RVR 130s, PVCs   Relevant CV Studies:  NM stress myoview 01/12/22:   Findings are consistent with no prior ischemia. The study is intermediate risk.   No ST deviation was noted.   LV perfusion is normal.   Left ventricular function is abnormal. Global function is mildly reduced. Nuclear stress EF: 47 %. The left ventricular ejection fraction is mildly decreased (45-54%). End diastolic cavity size is normal. End systolic cavity size is normal.   Prior study available for comparison from 10/03/2012. There are changes compared to prior study. The left ventricular ejection fraction has decreased. No ischemia. LVEF 67%   No reversible ischemia. LVEF 47% with global hypokinesis. This is an intermediate risk study. Compared to a prior study in 2014, the LVEF has decreased.  Echo from 12/07/21:   1. Left ventricular ejection fraction, by estimation, is 65 to 70%. The  left ventricle has normal function. The left ventricle has no regional  wall motion abnormalities. Left ventricular diastolic parameters are  consistent with Grade I diastolic   dysfunction (impaired relaxation).   2. Right ventricular systolic function is normal. The right ventricular  size is normal. There is normal pulmonary artery systolic pressure.   3. The mitral valve is normal in structure. No evidence of mitral valve  regurgitation. No evidence of mitral stenosis.   4. The aortic valve is tricuspid. Aortic valve regurgitation is not  visualized. No aortic stenosis is present.   5. The inferior vena cava is normal in size with greater than 50%  respiratory variability, suggesting right atrial pressure of 3 mmHg.   Comparison(s): No significant change from prior study.     Laboratory Data:  High Sensitivity Troponin:   Recent Labs  Lab 01/20/22 1709  TROPONINIHS 4     Chemistry Recent Labs  Lab 01/20/22 1705 01/20/22 1709  NA 144 142  K 3.8 3.9  CL 112* 113*  CO2  --  18*  GLUCOSE 161* 164*  BUN 7 8  CREATININE 0.60* 0.77  CALCIUM  --  7.3*  MG  --  0.8*  GFRNONAA  --  >60  ANIONGAP  --  11    Recent Labs  Lab 01/20/22 1709  PROT 6.5  ALBUMIN 3.7  AST 48*  ALT 40  ALKPHOS 68  BILITOT 0.3  Lipids No results for input(s): "CHOL", "TRIG", "HDL", "LABVLDL", "LDLCALC", "CHOLHDL" in the last 168 hours.  Hematology Recent Labs  Lab 01/20/22 1705 01/20/22 1709  WBC  --  8.1  RBC  --  4.25  HGB 14.3 11.8*  HCT 42.0 36.3*  MCV  --  85.4  MCH  --  27.8  MCHC  --  32.5  RDW  --  23.0*  PLT  --  162   Thyroid No results for input(s): "TSH", "FREET4" in the last 168 hours.  BNP Recent Labs  Lab 01/20/22 1700  BNP 116.7*    DDimer No results for input(s): "DDIMER" in the last 168 hours.   Radiology/Studies:  DG Chest Portable 1 View  Result Date: 01/20/2022 CLINICAL DATA:  Chest pain. EXAM: PORTABLE CHEST 1 VIEW COMPARISON:  Chest x-ray 12/05/2021 FINDINGS: Generator overlies the left chest. The heart size and mediastinal contours are within normal limits. Both lungs are clear. The visualized skeletal structures are  unremarkable. IMPRESSION: No active disease. Electronically Signed   By: Ronney Asters M.D.   On: 01/20/2022 17:46     Assessment and Plan:   Paroxysmal A fib RVR Hx of WPW syndrome s/p ablation (per patient reports) Systolic dysfunction  - UDS negative for cocaine, + opiates  - no significant metabolic derangement or infection at this time - will check TSH - recent NM stress 01/12/22 with LVEF down to 47%, would repeat Echo for evaluation of tachycardia mediated CM  - resume metoprolol, agree with amiodarone gtt for rate control  - plan for EP consult in AM   - no anticoagulation given low CHA2DS2-VASc Score = 1  - Keep Mag >2 and K >4   Risk Assessment/Risk Scores:  { CHA2DS2-VASc Score = 1   This indicates a 0.6% annual risk of stroke. The patient's score is based upon: CHF History: 0 HTN History: 1 Diabetes History: 0 Stroke History: 0 Vascular Disease History: 0 Age Score: 0 Gender Score: 0      For questions or updates, please contact [facility] Please consult [email or web address] for contact info under    Signed, Margie Billet, NP  01/20/2022 7:41 PM  History and all data above reviewed.  Patient examined.  I agree with the findings as above.   The patient presents with tachycardia.  He is found to be in atrial fibrillation with rapid rate.  I was able to see strips from EMS and at times he does have narrow complex rate of fibrillation up to 200.  I saw him last time he was in the hospital.  He came in with atrial fibrillation.  He was subsequently found to have alcoholic pancreatitis.  He was treated conservatively.  He was kept n.p.o. for quite a while.  His atrial fibrillation in the hospital was actually paroxysmal.  He was not treated with anticoagulation because of his high risk.  He was managed with beta-blockers.  We saw him in the office in follow-up.  He had some chest discomfort but no high risk findings on the perfusion study other than the suggestion that the  EF was lower.  It had not been low on echo in the hospital.  He still had rapid heart rates and his metoprolol was increased.  He is wearing a Zio patch.  He talked about wanting to go into alcohol rehab at last discharge but this did not happen.  He apparently drank alcohol again he says for the first time Tuesday night.  Lets  when he started having more palpitations.  Today he came in because his abdomen was hurting and his heart rate was faster.  He says he has been compliant with his medications.  The patient exam reveals COR: Irregular, no murmurs,  Lungs: Clear to auscultation bilaterally,  Abd: Positive bowel sounds with tenderness to palpation, Ext 2+ pulses, no edema..  All available labs, radiology testing, previous records reviewed. Agree with documented assessment and plan.  Atrial fibrillation: This is recurrent.  There is is questionable Parkinson White ablation in the past but we really could not document that.  Regardless there is no evidence of preexcitation wide-complex during these admissions.  Clearly his fibrillation is driven by pain and probably alcohol.  I think he would tolerate higher dose beta-blocker.  For now he can be on the IV Cardizem.  I might still avoid anticoagulation given his previous pancreatitis high risk with low thromboembolic risk and less this is more sustained.  I would like to ask EP's opinion about possible ablation since he keeps coming in with this issue.  Certainly not an ideal candidate but I would consider this if he can be shown to be compliant with medications and follow-up.  Minus Breeding  7:53 PM  01/20/2022

## 2022-01-20 NOTE — ED Notes (Signed)
Upon pt's arrival, pt's cardiac rhythm noted to be atrial fibrillation w/ runs of V-tach, HR 170s. Kneller MD at bedside, verbal order for '150mg'$  amiodarone.

## 2022-01-20 NOTE — ED Notes (Signed)
Dr. Rathore at bedside 

## 2022-01-20 NOTE — ED Notes (Signed)
Pt unplugged himself from all monitoring to use the restroom - pt instructed to use call light next time and given urinal at this time

## 2022-01-21 ENCOUNTER — Emergency Department (HOSPITAL_COMMUNITY)
Admission: EM | Admit: 2022-01-21 | Discharge: 2022-01-21 | Disposition: A | Discharge status: Home / Self Care | Payer: Medicaid Other | Attending: Emergency Medicine | Admitting: Emergency Medicine

## 2022-01-21 ENCOUNTER — Telehealth: Payer: Self-pay

## 2022-01-21 ENCOUNTER — Encounter (HOSPITAL_COMMUNITY): Payer: Self-pay | Admitting: Emergency Medicine

## 2022-01-21 ENCOUNTER — Other Ambulatory Visit (HOSPITAL_COMMUNITY): Payer: Medicaid Other

## 2022-01-21 ENCOUNTER — Ambulatory Visit (HOSPITAL_COMMUNITY)
Admission: EM | Admit: 2022-01-21 | Discharge: 2022-01-21 | Disposition: A | Discharge status: Home / Self Care | Payer: Medicaid Other | Attending: Family Medicine | Admitting: Family Medicine

## 2022-01-21 ENCOUNTER — Encounter (HOSPITAL_COMMUNITY): Payer: Self-pay

## 2022-01-21 ENCOUNTER — Other Ambulatory Visit: Payer: Self-pay

## 2022-01-21 DIAGNOSIS — I10 Essential (primary) hypertension: Secondary | ICD-10-CM

## 2022-01-21 DIAGNOSIS — R079 Chest pain, unspecified: Secondary | ICD-10-CM

## 2022-01-21 DIAGNOSIS — I480 Paroxysmal atrial fibrillation: Secondary | ICD-10-CM | POA: Insufficient documentation

## 2022-01-21 DIAGNOSIS — Z91040 Latex allergy status: Secondary | ICD-10-CM | POA: Insufficient documentation

## 2022-01-21 DIAGNOSIS — R1013 Epigastric pain: Secondary | ICD-10-CM | POA: Insufficient documentation

## 2022-01-21 DIAGNOSIS — F1020 Alcohol dependence, uncomplicated: Secondary | ICD-10-CM

## 2022-01-21 DIAGNOSIS — Z79899 Other long term (current) drug therapy: Secondary | ICD-10-CM | POA: Insufficient documentation

## 2022-01-21 DIAGNOSIS — R109 Unspecified abdominal pain: Secondary | ICD-10-CM | POA: Diagnosis present

## 2022-01-21 DIAGNOSIS — R1084 Generalized abdominal pain: Secondary | ICD-10-CM

## 2022-01-21 DIAGNOSIS — G8929 Other chronic pain: Secondary | ICD-10-CM

## 2022-01-21 DIAGNOSIS — Z8711 Personal history of peptic ulcer disease: Secondary | ICD-10-CM

## 2022-01-21 DIAGNOSIS — K219 Gastro-esophageal reflux disease without esophagitis: Secondary | ICD-10-CM

## 2022-01-21 DIAGNOSIS — F332 Major depressive disorder, recurrent severe without psychotic features: Secondary | ICD-10-CM

## 2022-01-21 LAB — COMPREHENSIVE METABOLIC PANEL
ALT: 34 U/L (ref 0–44)
ALT: 37 U/L (ref 0–44)
AST: 29 U/L (ref 15–41)
AST: 30 U/L (ref 15–41)
Albumin: 3.6 g/dL (ref 3.5–5.0)
Albumin: 4.3 g/dL (ref 3.5–5.0)
Alkaline Phosphatase: 69 U/L (ref 38–126)
Alkaline Phosphatase: 80 U/L (ref 38–126)
Anion gap: 10 (ref 5–15)
Anion gap: 12 (ref 5–15)
BUN: 5 mg/dL — ABNORMAL LOW (ref 6–20)
BUN: <5 mg/dL — ABNORMAL LOW (ref 6–20)
CO2: 20 mmol/L — ABNORMAL LOW (ref 22–32)
CO2: 20 mmol/L — ABNORMAL LOW (ref 22–32)
Calcium: 7.8 mg/dL — ABNORMAL LOW (ref 8.9–10.3)
Calcium: 8.3 mg/dL — ABNORMAL LOW (ref 8.9–10.3)
Chloride: 113 mmol/L — ABNORMAL HIGH (ref 98–111)
Chloride: 109 mmol/L (ref 98–111)
Creatinine, Ser: 0.65 mg/dL (ref 0.61–1.24)
Creatinine, Ser: 0.66 mg/dL (ref 0.61–1.24)
GFR, Estimated: >60 mL/min (ref >60)
GFR, Estimated: >60 mL/min (ref >60)
Glucose, Bld: 88 mg/dL (ref 70–99)
Glucose, Bld: 107 mg/dL — ABNORMAL HIGH (ref 70–99)
Potassium: 3.8 mmol/L (ref 3.5–5.1)
Potassium: 3.8 mmol/L (ref 3.5–5.1)
Sodium: 143 mmol/L (ref 135–145)
Sodium: 141 mmol/L (ref 135–145)
Total Bilirubin: 0.3 mg/dL (ref 0.3–1.2)
Total Bilirubin: 0.7 mg/dL (ref 0.3–1.2)
Total Protein: 6.4 g/dL — ABNORMAL LOW (ref 6.5–8.1)
Total Protein: 7.5 g/dL (ref 6.5–8.1)

## 2022-01-21 LAB — CBC WITH DIFFERENTIAL/PLATELET
Abs Immature Granulocytes: 0.04 10*3/uL (ref 0.00–0.07)
Basophils Absolute: 0 10*3/uL (ref 0.0–0.1)
Basophils Relative: 0 %
Eosinophils Absolute: 0.3 10*3/uL (ref 0.0–0.5)
Eosinophils Relative: 3 %
HCT: 38.3 % — ABNORMAL LOW (ref 39.0–52.0)
Hemoglobin: 12.2 g/dL — ABNORMAL LOW (ref 13.0–17.0)
Immature Granulocytes: 0 %
Lymphocytes Relative: 20 %
Lymphs Abs: 1.9 10*3/uL (ref 0.7–4.0)
MCH: 28.4 pg (ref 26.0–34.0)
MCHC: 31.9 g/dL (ref 30.0–36.0)
MCV: 89.1 fL (ref 80.0–100.0)
Monocytes Absolute: 0.5 10*3/uL (ref 0.1–1.0)
Monocytes Relative: 5 %
Neutro Abs: 7 10*3/uL (ref 1.7–7.7)
Neutrophils Relative %: 72 %
Platelets: 152 10*3/uL (ref 150–400)
RBC: 4.3 MIL/uL (ref 4.22–5.81)
RDW: 23.9 % — ABNORMAL HIGH (ref 11.5–15.5)
WBC: 9.8 10*3/uL (ref 4.0–10.5)
nRBC: 0 % (ref 0.0–0.2)

## 2022-01-21 LAB — RAPID URINE DRUG SCREEN, HOSP PERFORMED
Amphetamines: NOT DETECTED
Barbiturates: NOT DETECTED
Benzodiazepines: NOT DETECTED
Cocaine: NOT DETECTED
Opiates: POSITIVE — AB
Tetrahydrocannabinol: NOT DETECTED

## 2022-01-21 LAB — TROPONIN I (HIGH SENSITIVITY)
Troponin I (High Sensitivity): 4 ng/L (ref <18)
Troponin I (High Sensitivity): 5 ng/L (ref <18)

## 2022-01-21 LAB — MAGNESIUM: Magnesium: 2 mg/dL (ref 1.7–2.4)

## 2022-01-21 LAB — LIPASE, BLOOD: Lipase: 20 U/L (ref 11–51)

## 2022-01-21 MED ORDER — OXYCODONE-ACETAMINOPHEN 5-325 MG PO TABS: 1.0000 | ORAL_TABLET | Freq: Four times a day (QID) | ORAL | 0 refills | Status: DC | PRN

## 2022-01-21 MED ORDER — SUCRALFATE 1 G PO TABS: 1.0000 g | ORAL_TABLET | Freq: Three times a day (TID) | ORAL | 1 refills | Status: DC

## 2022-01-21 MED ORDER — OXYCODONE-ACETAMINOPHEN 5-325 MG PO TABS
1.0000 | ORAL_TABLET | Freq: Once | ORAL | Status: AC
  Administered 2022-01-21: 1 via ORAL
  Filled 2022-01-21: qty 1

## 2022-01-21 MED ORDER — PANTOPRAZOLE SODIUM 40 MG PO TBEC: 40.0000 mg | DELAYED_RELEASE_TABLET | Freq: Every day | ORAL | 0 refills | Status: DC

## 2022-01-21 MED ORDER — HYDROMORPHONE HCL 1 MG/ML IJ SOLN: 1.0000 mg | Freq: Once | INTRAMUSCULAR | Status: DC

## 2022-01-21 MED ORDER — SUCRALFATE 1 G PO TABS: 1.0000 g | ORAL_TABLET | Freq: Three times a day (TID) | ORAL | 0 refills | Status: DC

## 2022-01-21 MED ORDER — HYDROMORPHONE HCL 1 MG/ML IJ SOLN
0.5000 mg | Freq: Once | INTRAMUSCULAR | Status: AC
  Administered 2022-01-21: 0.5 mg via INTRAVENOUS
  Filled 2022-01-21: qty 1

## 2022-01-21 NOTE — Discharge Summary (Signed)
Physician Discharge Summary  [name] WUX:[id number] DOB: 1971-02-24 DOA: 01/20/2022  PCP: Pcp, No  Admit date: 01/20/2022 Discharge date: 01/21/2022    Patient left AMA  Time spent: 20 minutes  Recommendations for Outpatient Follow-up:    LEFT AMA   Discharge Diagnoses:  Principal Problem:   Paroxysmal atrial fibrillation with RVR ([place]) Active Problems:   Essential hypertension   Major depressive disorder, recurrent severe without psychotic features ([place])   Alcohol use disorder, severe, dependence ([place])   Gastroesophageal reflux disease   Abdominal pain   Chest pain   Hypocalcemia   Hypomagnesemia   WPW (Wolff-Parkinson-White syndrome)   Normal anion gap metabolic acidosis   NSVT (nonsustained ventricular tachycardia) ([place])   Discharge Condition: LEFT AMA  Diet recommendation: Left AMA  There were no vitals filed for this visit.  History of present illness:  HPI per Dr. Otis Dials [name] is a 51 y.o. male with medical history significant of paroxysmal A-fib not on anticoagulation, Wolff-Parkinson-White syndrome status post ablation (per patient reports), alcohol use disorder, cocaine use disorder, GERD, anxiety/depression, bipolar disorder, arthritis, asthma, chronic bronchitis, chronic pain/narcotic seeking behavior, chronic alcoholic pancreatitis, hyperlipidemia, hypertension presented to the ED with chest pain and palpitations.  When medics arrived his heart rate was in the 200-220s.  They suspected SVT and gave the patient adenosine 6 mg followed by 12 mg, again followed by another 12 mg without any improvement.  Attempted vagal maneuvers without any improvement.  Upon arrival to the ED, patient noted to be in A-fib with RVR, rate in the 180s to 190s with intermittent NSVT.  He was given IV amiodarone 150 mg and started on continuous infusion.  He was also given IV Lopressor 5 mg.  Heart rate subsequently improved.  Labs showing no leukocytosis,  hemoglobin 11.8 (stable), magnesium 0.8, potassium 3.9, bicarb 18, anion gap 11, glucose 164, calcium 7.3, albumin 3.7, ionized calcium 0.93, BNP 116, initial high-sensitivity troponin normal and repeat pending, UDS positive for opiates, TSH pending.  Chest x-ray showing no active disease. Other medications administered in the ED include Tylenol, Maalox, Benadryl, Pepcid, morphine, and IV magnesium 2 g.  Cardiology consulted.   Patient states his heart has been racing since yesterday.  He is taking metoprolol at home and states his cardiologist had recently increased the dose and he had Zio patch placed.  Last dose of metoprolol was this morning.  Today when he went to use the bathroom in the afternoon, he felt dizzy and short of breath.  He vomited last night.  Today he started having severe epigastric/substernal chest pain today after eating 3 hotdogs for lunch.  He has felt nauseous today and also having nonbloody diarrhea.  He reports history of pancreatitis and thinks he might be having it again.  Reports drinking 1 to 2 40 ounce cans of beer daily, last drink was 2 days ago.  States he has not used cocaine for the past 1 month.  He reports improvement of his chest pain since after receiving medications in the ED but states he is still having a lot of abdominal pain and requesting Dilaudid.  Hospital Course:  Patient was admitted for paroxysmal A-fib with RVR/NSVT/epigastric abd pain, seen by cardiology in consult. Patient subsequently left AMA  Procedures: CT abdomen and pelvis 01/20/2022 Chest x-ray 01/20/2022  Consultations: Cardiology: Dr. Percival Spanish 01/20/2022  Discharge Exam: Vitals:   01/21/22 0730 01/21/22 0800  BP: (!) 141/97 (!) 135/104  Pulse: (!) 102   Resp: [name])  0 17  Temp:    SpO2: 98%     General: Patient left AMA Cardiovascular: Patient left AMA Respiratory: Patient left AMA  Discharge Instructions     Allergies  Allergen Reactions   Robaxin [Methocarbamol] Other  (See Comments)    "jumpy limbs"   Aspirin Other (See Comments)    Unknown reaction   Shellfish-Derived Products Nausea And Vomiting and Rash   Trazodone And Nefazodone Other (See Comments)    Muscle spasms   Adhesive [Tape] Itching   Latex Itching   Toradol [Ketorolac Tromethamine] Other (See Comments)    Has ulcers; cannot have this   Contrast Media [Iodinated Contrast Media] Hives   Reglan [Metoclopramide] Other (See Comments)    Muscle spasms   Salmon [Fish Oil] Nausea And Vomiting and Rash      The results of significant diagnostics from this hospitalization (including imaging, microbiology, ancillary and laboratory) are listed below for reference.    Significant Diagnostic Studies: CT ABDOMEN PELVIS WO CONTRAST  Result Date: 01/20/2022 CLINICAL DATA:  Abdominal pain. EXAM: CT ABDOMEN AND PELVIS WITHOUT CONTRAST TECHNIQUE: Multidetector CT imaging of the abdomen and pelvis was performed following the standard protocol without IV contrast. RADIATION DOSE REDUCTION: This exam was performed according to the departmental dose-optimization program which includes automated exposure control, adjustment of the mA and/or kV according to patient size and/or use of iterative reconstruction technique. COMPARISON:  CT abdomen pelvis dated 12/11/2021. FINDINGS: Evaluation of this exam is limited in the absence of intravenous contrast as well as due to respiratory motion. Lower chest: The visualized lung bases are clear. No intra-abdominal free air or free fluid. Hepatobiliary: The liver is unremarkable. No biliary ductal dilatation. The gallbladder is unremarkable. Pancreas: Scattered coarse pancreatic calcification sequela of chronic pancreatitis. A 2 cm cystic lesion in the body of the pancreas as well as additional faint smaller cystic lesion in the tail the pancreas again noted which may represent pseudocysts. No dilatation of the main pancreatic duct. Spleen: The spleen is small otherwise  unremarkable. Adrenals/Urinary Tract: The adrenal glands are unremarkable. There is no hydronephrosis or nephrolithiasis on either side. The visualized ureters and urinary bladder appear unremarkable. Stomach/Bowel: There is no bowel obstruction or active inflammation. The appendix is normal. Vascular/Lymphatic: Moderate aortoiliac atherosclerotic disease. The IVC is unremarkable. No portal venous gas. There is no adenopathy. Reproductive: The prostate and seminal vesicles are grossly unremarkable. No free gas. Other: None Musculoskeletal: Osteopenia with mild degenerative changes. No acute osseous pathology. IMPRESSION: 1. No acute intra-abdominal or pelvic pathology. No bowel obstruction. Normal appendix. 2. Sequela of chronic pancreatitis. 3. Aortic Atherosclerosis (ICD10-I70.0). Electronically Signed   By: Anner Crete M.D.   On: 01/20/2022 21:56   DG Chest Portable 1 View  Result Date: 01/20/2022 CLINICAL DATA:  Chest pain. EXAM: PORTABLE CHEST 1 VIEW COMPARISON:  Chest x-ray 12/05/2021 FINDINGS: Generator overlies the left chest. The heart size and mediastinal contours are within normal limits. Both lungs are clear. The visualized skeletal structures are unremarkable. IMPRESSION: No active disease. Electronically Signed   By: Ronney Asters M.D.   On: 01/20/2022 17:46   MYOCARDIAL PERFUSION IMAGING  Result Date: 01/12/2022   Findings are consistent with no prior ischemia. The study is intermediate risk.   No ST deviation was noted.   LV perfusion is normal.   Left ventricular function is abnormal. Global function is mildly reduced. Nuclear stress EF: 47 %. The left ventricular ejection fraction is mildly decreased (45-54%). End diastolic cavity size is  normal. End systolic cavity size is normal.   Prior study available for comparison from 10/03/2012. There are changes compared to prior study. The left ventricular ejection fraction has decreased. No ischemia. LVEF 67% No reversible ischemia. LVEF 47%  with global hypokinesis. This is an intermediate risk study. Compared to a prior study in 2014, the LVEF has decreased.   MR ABDOMEN MRCP W WO CONTAST  Result Date: 12/22/2021 CLINICAL DATA:  Uncooperative patient. Best images possible. Concern for recurrent pancreatitis. Chronic pancreatitis history. EXAM: MRI ABDOMEN WITHOUT AND WITH CONTRAST (INCLUDING MRCP) TECHNIQUE: Multiplanar multisequence MR imaging of the abdomen was performed both before and after the administration of intravenous contrast. Heavily T2-weighted images of the biliary and pancreatic ducts were obtained, and three-dimensional MRCP images were rendered by post processing. CONTRAST:  29m GADAVIST GADOBUTROL 1 MMOL/ML IV SOLN COMPARISON:  CT 12/11/2021, MRI 11/22/2019 . FINDINGS: Lower chest:  Lung bases are clear. Hepatobiliary: No hepatic steatosis. No intrahepatic or extrahepatic biliary duct dilatation. Common bile duct measures 5 mm within normal limits. No gallstones evident within the gallbladder. Postcontrast enhanced T1 weighted imaging demonstrates no enhancing hepatic lesion. Pancreas: No variant ductal pancreatic anatomy. There is a unilocular cyst in the tail the pancreas measuring 1.8 by 1.6 cm. This is reduced in volume from MRI 2021 but similar measuring 2.1 x 2.7 cm at that time. There is ductal ectasia in the tail the pancreas improved from cyst on comparison exam. towards the head of the pancreas there is a small cyst measuring 5 mm (image 20/3. Decreased from a 18 mm cyst on comparison MRI. There is no pancreatic duct dilatation. No clear acute inflammation involving the pancreas. No pancreatic edema. Postcontrast images demonstrate enhancement of the pancreatic parenchyma. No edema or acute fluid collections identified. The postcontrast T1 weighted imaging is somewhat degraded by respiratory motion patient motion. Spleen: Normal spleen. Adrenals/urinary tract: Adrenal glands and kidneys are normal. Stomach/Bowel: Stomach  and limited of the small bowel is unremarkable Vascular/Lymphatic: Abdominal aortic normal caliber. No retroperitoneal periportal lymphadenopathy. Musculoskeletal: No aggressive osseous lesion IMPRESSION: 1. No evidence acute pancreatitis. Chronic cysts within the pancreatic body are improved from MRI 2021. No new fluid collections. 2. Normal biliary tree.  No cholelithiasis. 3. No enhancing hepatic lesion. 4. Some limitation of exam due to patient being uncooperative. Electronically Signed   By: SSuzy BouchardM.D.   On: 12/22/2021 17:35    Microbiology: No results found for this or any previous visit (from the past 240 hour(s)).   Labs: Basic Metabolic Panel: Recent Labs  Lab 01/20/22 1705 01/20/22 1709 01/21/22 0205  NA 144 142 143  K 3.8 3.9 3.8  CL 112* 113* 113*  CO2  --  18* 20*  GLUCOSE 161* 164* 88  BUN 7 8 5*  CREATININE 0.60* 0.77 0.65  CALCIUM  --  7.3* 7.8*  MG  --  0.8* 2.0   Liver Function Tests: Recent Labs  Lab 01/20/22 1709 01/21/22 0205  AST 48* 29  ALT 40 34  ALKPHOS 68 69  BILITOT 0.3 0.3  PROT 6.5 6.4*  ALBUMIN 3.7 3.6   Recent Labs  Lab 01/20/22 1709  LIPASE 24   No results for input(s): "AMMONIA" in the last 168 hours. CBC: Recent Labs  Lab 01/20/22 1705 01/20/22 1709  WBC  --  8.1  NEUTROABS  --  3.4  HGB 14.3 11.8*  HCT 42.0 36.3*  MCV  --  85.4  PLT  --  162   Cardiac  Enzymes: No results for input(s): "CKTOTAL", "CKMB", "CKMBINDEX", "TROPONINI" in the last 168 hours. BNP: BNP (last 3 results) Recent Labs    08/17/21 0317 08/18/21 0257 01/20/22 1700  BNP 547.5* 1,104.6* 116.7*    ProBNP (last 3 results) No results for input(s): "PROBNP" in the last 8760 hours.  CBG: No results for input(s): "GLUCAP" in the last 168 hours.     Signed:  Irine Seal MD.  [facility] 01/21/2022, 8:31 AM

## 2022-01-21 NOTE — ED Triage Notes (Signed)
Patient return to ED after leaving a few hours ago. Patient was admitting waiting for inpatient bed assignment but left at approximately 0830 this morning. Patient is alert, oriented, and in no apparent distress at this time.

## 2022-01-21 NOTE — Telephone Encounter (Signed)
Will route to primary Cards.   Patient left AMA- is currently at Urgent Care.   Thanks!

## 2022-01-21 NOTE — ED Notes (Signed)
Pt ambulatory and stable upon departure

## 2022-01-21 NOTE — ED Notes (Signed)
MD Hal Hope notified of pt requesting something other than tylenol for pain. MD Hal Hope also notified that pt having occasional runs of vtach on monitor - Per MD check BMET and Mag now

## 2022-01-21 NOTE — ED Notes (Signed)
Pt walked out of the ER with Ivs and gtts still running. Pt redirected back into room where provider was waiting to speak with him

## 2022-01-21 NOTE — ED Notes (Signed)
Admitting paged to RN  

## 2022-01-21 NOTE — ED Notes (Signed)
Pt stated " I  am leaving" informed he just spoke with the provider where he stated, " you can't keep me here, take this sh*t out so I can leave" Pt removed his own Ivs and refused to allowed RN to stop his bleeding.

## 2022-01-21 NOTE — Discharge Instructions (Signed)
Please take Percocet as prescribed.  Take Protonix daily and also Carafate as prescribed  You need to see cardiology for your atrial fibrillation.  You need to see a pain management doctor on Monday as scheduled  Return to ER if you have worse abdominal pain, chest pain, palpitations

## 2022-01-21 NOTE — Telephone Encounter (Signed)
Patient walked into the office this morning. He just left the ER where he was evaluated for A Fib with RVR. Patient states that they took his heart monitor off of him and didn't give it back. He states he is here to get a new monitor. Patient wore the monitor for one week. Will make ordering provider Fabian Sharp, PA aware and advise on ordering a new monitor for the patient.

## 2022-01-21 NOTE — ED Notes (Signed)
Pt requesting something for pain - refusing tylenol and would like something else - MD Hal Hope notified

## 2022-01-21 NOTE — ED Provider Notes (Signed)
[facility] EMERGENCY DEPARTMENT Provider Note   CSN: [id number] Arrival date & time: 01/21/22  1003     History  Chief Complaint  Patient presents with   Chest Pain    Kullen Kail Fraley is a 51 y.o. male history of chronic pancreatitis, chronic pain here presenting with chest pain abdominal pain.  Patient is well-known to our department.  He was admitted yesterday for chronic pancreatitis.  He signed out [place] because he was not allowed to eat.  He states that he was just acting a fall and today he had palpitations.  EMS was called and he was noted to be in rapid A-fib was given adenosine prior to arrival.  Patient states that he actually ran out of his Percocet.  He states that he has pain management doctor follow-up on Monday and he gets 23 Percocets.  He states that he was told that he can get extra Percocets as long as he has a note from another doctor  The history is provided by the patient.       Home Medications Prior to Admission medications   Medication Sig Start Date End Date Taking? Authorizing Provider  acetaminophen (TYLENOL) 500 MG tablet Take 1,000 mg by mouth every 6 (six) hours as needed for moderate pain or headache.    [provider]  albuterol (VENTOLIN HFA) 108 (90 Base) MCG/ACT inhaler Inhale 2 puffs into the lungs every 4 (four) hours as needed for wheezing or shortness of breath. 08/17/21   [name], MD  amLODipine (NORVASC) 10 MG tablet Take 1 tablet (10 mg total) by mouth daily. Patient not taking: Reported on 01/06/2022 12/25/21   [name], MD  folic acid (FOLVITE) 1 MG tablet Take 1 tablet (1 mg total) by mouth daily. Patient not taking: Reported on 01/20/2022 11/26/17   [name], MD  HYDROcodone-acetaminophen (NORCO/VICODIN) 5-325 MG tablet Take 1 tablet by mouth every 12 (twelve) hours as needed for moderate pain. 12/24/21   [name], MD  lidocaine (LMX) 4 % cream Apply  topically 4 (four) times daily as needed (NGT pain). 12/24/21   [name], MD  lipase/protease/amylase (CREON) 36000 UNITS CPEP capsule Take 2 capsules (72,000 Units total) by mouth 3 (three) times daily with meals. 12/24/21   [name], MD  magnesium oxide (MAG-OX) 400 (240 Mg) MG tablet Take 1 tablet (400 mg total) by mouth daily. 11/30/21   Davonna Belling, MD  metoprolol succinate (TOPROL XL) 25 MG 24 hr tablet Take 1 tablet (25 mg total) by mouth as needed (for palpitations). To not exceed 1 dose per day. Patient taking differently: Take 50 mg by mouth daily. 01/06/22   D[facility], Tami Lin, PA  metoprolol succinate (TOPROL-XL) 100 MG 24 hr tablet Take 1 tablet (100 mg total) by mouth daily. Take with or immediately following a meal. 12/25/21   [name], MD  Multiple Vitamin (MULTIVITAMIN WITH MINERALS) TABS tablet Take 1 tablet by mouth daily. 09/06/17   Bonnell Public, MD  nicotine (NICODERM CQ - DOSED IN MG/24 HOURS) 21 mg/24hr patch Place 1 patch (21 mg total) onto the skin at bedtime. Patient not taking: Reported on 01/06/2022 12/24/21   [name], MD  nitroGLYCERIN (NITROSTAT) 0.4 MG SL tablet Place 1 tablet (0.4 mg total) under the tongue every 5 (five) minutes as needed for chest pain. Do not exceed 3 doses in a row. 01/06/22   Ledora Bottcher, PA  ondansetron (ZOFRAN) 4 MG tablet Take 1 tablet (4 mg total) by mouth every 6 (six) hours. 11/03/21   Sherrell Puller, PA-C  pantoprazole (PROTONIX) 40 MG tablet Take 1 tablet (40 mg total) by mouth 2 (two) times daily. Patient taking differently: Take 40 mg by mouth at bedtime. 12/24/21   [name], MD  Polyethyl Glycol-Propyl Glycol (SYSTANE OP) Place 1 drop into both eyes 4 (four) times daily as needed (for dryness).    [provider]  sucralfate (CARAFATE) 1 g tablet Take 1 tablet (1 g total) by mouth 4 (four) times daily -  with meals and at bedtime. 10/12/21   Willia Craze, NP  thiamine 100  MG tablet Take 1 tablet (100 mg total) by mouth daily. 10/16/18   [name], MD  Vitamin D, Ergocalciferol, (DRISDOL) 1.25 MG (50000 UNIT) CAPS capsule Take 50,000 Units by mouth every Wednesday. Patient not taking: Reported on 01/20/2022    [provider]  amitriptyline (ELAVIL) 25 MG tablet Take 1 tablet (25 mg total) by mouth at bedtime. Patient not taking: Reported on 08/08/2019 10/15/18 08/08/19  [name], MD      Allergies    Robaxin [methocarbamol], Aspirin, Shellfish-derived products, Trazodone and nefazodone, Adhesive [tape], Latex, Toradol [ketorolac tromethamine], Contrast media [iodinated contrast media], Reglan [metoclopramide], and Salmon [fish oil]    Review of Systems   Review of Systems  Cardiovascular:  Positive for chest pain.  Gastrointestinal:  Positive for abdominal pain.  All other systems reviewed and are negative.   Physical Exam Updated Vital Signs BP 129/81   Pulse 81   Temp 98.7 F (37.1 C) (Oral)   Resp (!) 26   SpO2 100%  Physical Exam Vitals and nursing note reviewed.  HENT:     Head: Normocephalic.  Eyes:     Extraocular Movements: Extraocular movements intact.     Pupils: Pupils are equal, round, and reactive to light.  Cardiovascular:     Rate and Rhythm: Normal rate and regular rhythm.     Heart sounds: Normal heart sounds.  Pulmonary:     Effort: Pulmonary effort is normal.     Breath sounds: Normal breath sounds.  Abdominal:     Palpations: Abdomen is soft.     Comments: Mild epigastric tenderness  Musculoskeletal:        General: Normal range of motion.     Cervical back: Normal range of motion and neck supple.  Skin:    General: Skin is warm.  Neurological:     General: No focal deficit present.     Mental Status: He is alert and oriented to person, place, and time.  Psychiatric:        Behavior: Behavior normal.     ED Results / Procedures / Treatments   Labs (all labs ordered are listed, but only abnormal  results are displayed) Labs Reviewed  COMPREHENSIVE METABOLIC PANEL - Abnormal; Notable for the following components:      Result Value   CO2 20 (*)    Glucose, Bld 107 (*)    BUN <5 (*)    Calcium 8.3 (*)    All other components within normal limits  CBC WITH DIFFERENTIAL/PLATELET - Abnormal; Notable for the following components:   Hemoglobin 12.2 (*)    HCT 38.3 (*)    RDW 23.9 (*)    All other components within normal limits  RAPID URINE DRUG SCREEN, HOSP PERFORMED - Abnormal; Notable for the following components:  Opiates POSITIVE (*)    All other components within normal limits  LIPASE, BLOOD  TROPONIN I (HIGH SENSITIVITY)  TROPONIN I (HIGH SENSITIVITY)    EKG  ED ECG REPORT   Date: 01/21/2022  Rate: 69  Rhythm: normal sinus rhythm with PAC  QRS Axis: normal  Intervals: normal  ST/T Wave abnormalities: normal  Conduction Disutrbances:none  Narrative Interpretation:   Old EKG Reviewed: none available  I have personally reviewed the EKG tracing and agree with the computerized printout as noted.   Radiology CT ABDOMEN PELVIS WO CONTRAST  Result Date: 01/20/2022 CLINICAL DATA:  Abdominal pain. EXAM: CT ABDOMEN AND PELVIS WITHOUT CONTRAST TECHNIQUE: Multidetector CT imaging of the abdomen and pelvis was performed following the standard protocol without IV contrast. RADIATION DOSE REDUCTION: This exam was performed according to the departmental dose-optimization program which includes automated exposure control, adjustment of the mA and/or kV according to patient size and/or use of iterative reconstruction technique. COMPARISON:  CT abdomen pelvis dated 12/11/2021. FINDINGS: Evaluation of this exam is limited in the absence of intravenous contrast as well as due to respiratory motion. Lower chest: The visualized lung bases are clear. No intra-abdominal free air or free fluid. Hepatobiliary: The liver is unremarkable. No biliary ductal dilatation. The gallbladder is  unremarkable. Pancreas: Scattered coarse pancreatic calcification sequela of chronic pancreatitis. A 2 cm cystic lesion in the body of the pancreas as well as additional faint smaller cystic lesion in the tail the pancreas again noted which may represent pseudocysts. No dilatation of the main pancreatic duct. Spleen: The spleen is small otherwise unremarkable. Adrenals/Urinary Tract: The adrenal glands are unremarkable. There is no hydronephrosis or nephrolithiasis on either side. The visualized ureters and urinary bladder appear unremarkable. Stomach/Bowel: There is no bowel obstruction or active inflammation. The appendix is normal. Vascular/Lymphatic: Moderate aortoiliac atherosclerotic disease. The IVC is unremarkable. No portal venous gas. There is no adenopathy. Reproductive: The prostate and seminal vesicles are grossly unremarkable. No free gas. Other: None Musculoskeletal: Osteopenia with mild degenerative changes. No acute osseous pathology. IMPRESSION: 1. No acute intra-abdominal or pelvic pathology. No bowel obstruction. Normal appendix. 2. Sequela of chronic pancreatitis. 3. Aortic Atherosclerosis (ICD10-I70.0). Electronically Signed   By: Anner Crete M.D.   On: 01/20/2022 21:56   DG Chest Portable 1 View  Result Date: 01/20/2022 CLINICAL DATA:  Chest pain. EXAM: PORTABLE CHEST 1 VIEW COMPARISON:  Chest x-ray 12/05/2021 FINDINGS: Generator overlies the left chest. The heart size and mediastinal contours are within normal limits. Both lungs are clear. The visualized skeletal structures are unremarkable. IMPRESSION: No active disease. Electronically Signed   By: Ronney Asters M.D.   On: 01/20/2022 17:46    Procedures Procedures    Medications Ordered in ED Medications  HYDROmorphone (DILAUDID) injection 1 mg (has no administration in time range)    ED Course/ Medical Decision Making/ A&P                           Medical Decision Making Bryon Ulisses Vondrak is a 51 y.o. male here  presenting with abdominal pain and chest pain.  Patient was in rapid A-fib yesterday but signed out [place].  Patient apparently was in A-fib on arrival.  Repeat EKG in the ED showed normal sinus rhythm with PAC.  Of note, patient's CHADVASC is 0 so will not need anticoagulation. patient's electrolytes were unremarkable.  Patient has opiate in his UDS.  He told me that he is  here basically to refill his pain meds until he can get them filled on Monday.  He told me that his pain management doctor said that as long as he has a doctor's note, his pain contract would not be avoided.  I gave him a dose of pain meds in the ED.  Will discharge home with his dose of pain medicine.  I reviewed his records from yesterday as well as reviewed his PDMP.    Problems Addressed: Abdominal pain, chronic, epigastric: chronic illness or injury Chest pain, unspecified type: chronic illness or injury Paroxysmal atrial fibrillation [facility]): acute illness or injury  Risk Prescription drug management.    Final Clinical Impression(s) / ED Diagnoses Final diagnoses:  None    Rx / DC Orders ED Discharge Orders     None         Drenda Freeze, MD 01/21/22 2004

## 2022-01-21 NOTE — ED Provider Triage Note (Signed)
Emergency Medicine Provider Triage Evaluation Note  [name] , a 51 y.o. male  was evaluated in triage.  Pt complains of continued abdominal pain with some chest tightness and palpitations.  Patient was seen here yesterday and had extensive work-up and was to be admitted for evaluation of A-fib with RVR.  Patient left AMA because he states that they would not let him eat and he was hungry.  He continues to have palpitations to be admitted for evaluation of A-fib with RVR.  Patient left AMA because he states that they would not let him eat and he was hungry.  He continues to have palpitations.  Review of Systems  Positive:  Negative:   Physical Exam  BP (!) 126/98 (BP Location: Left Arm)   Pulse 88   Temp 98 F (36.7 C)   Resp 20   SpO2 100%  Gen:   Awake, no distress   Resp:  Normal effort  MSK:   Moves extremities without difficulty  Other:    Medical Decision Making  Medically screening exam initiated at 10:53 AM.  Appropriate orders placed.  [name] was informed that the remainder of the evaluation will be completed by another provider, this initial triage assessment does not replace that evaluation, and the importance of remaining in the ED until their evaluation is complete.     Tonye Pearson, [place] 01/21/22 1055

## 2022-01-21 NOTE — ED Notes (Signed)
Pt upset wanting to leave because " I haven't talked to a doctor in a few hours and I am hungry". This RN stated I would check about food and reach out to the admitting provider to come speak with you either in person or over the phone

## 2022-01-21 NOTE — ED Notes (Signed)
First '1mg'$  tablet dropped on floor by this RN - This RN wasted tablet with Mason Jim RN in stericycle bin.

## 2022-01-21 NOTE — ED Notes (Signed)
Pt woke up requesting pain meds and ice chips. Pt informed that he is NPO at this time. Pt slightly agitated and states he wants to go smoke a cigarette. Pt informed that is not possible at this time. This RN offered tylenol for pain, pt initially refused but now is agreeable to try it. This RN reached out to MD to see if pt can have ice chips - awaiting response

## 2022-01-21 NOTE — ED Triage Notes (Signed)
Pt reports to the office for from the ED. Pt reported he left the ED due to wait time. Pt reports palpitations and cp started this morning.

## 2022-01-22 LAB — CALCIUM, IONIZED: Calcium, Ionized, Serum: 4.5 mg/dL (ref 4.5–5.6)

## 2022-01-28 IMAGING — CT CT ABD-PELV W/O CM
2 of 4 series · 15 of 46 positions shown, 17 images · non-contrast
Comparison: Chest and abdomen radiographs from earlier today.

CLINICAL DATA: Chest pain. Abdominal pain. Concern for bowel
obstruction.

EXAM:
CT ABDOMEN AND PELVIS WITHOUT CONTRAST
TECHNIQUE: Multidetector CT imaging of the abdomen and pelvis was performed
following the standard protocol without IV contrast.

[Series 3: a/p w/o 5mm · axial · non-contrast · 0.77mm/px · z∈[+904,+1334]mm · 12 of 96 slices shown, 14 images]
[im 5/96  soft-tissue]
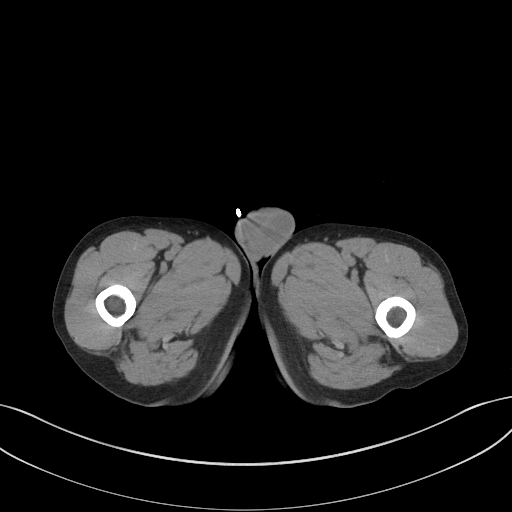
[im 5/96  bone]
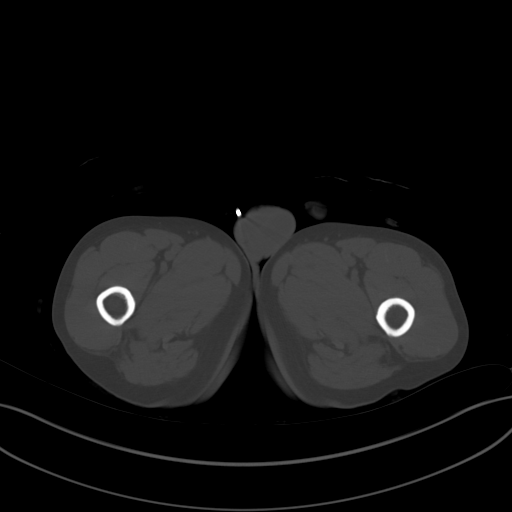
[im 13/96  soft-tissue]
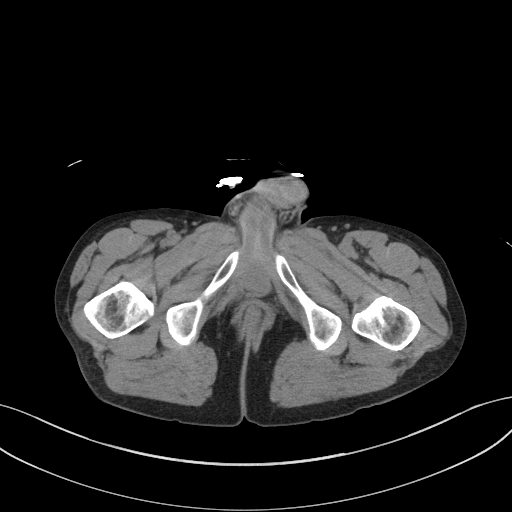
[im 22/96  soft-tissue]
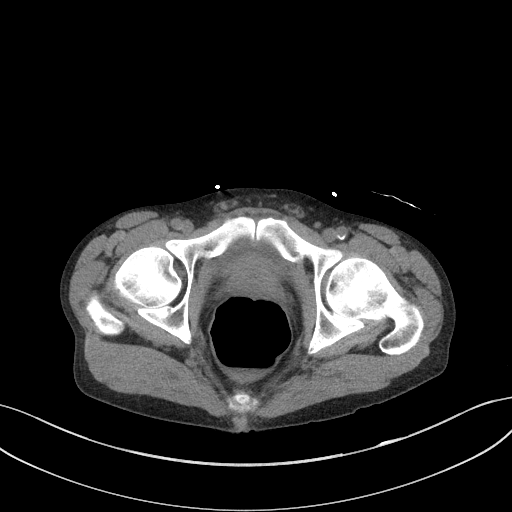
[im 31/96  soft-tissue]
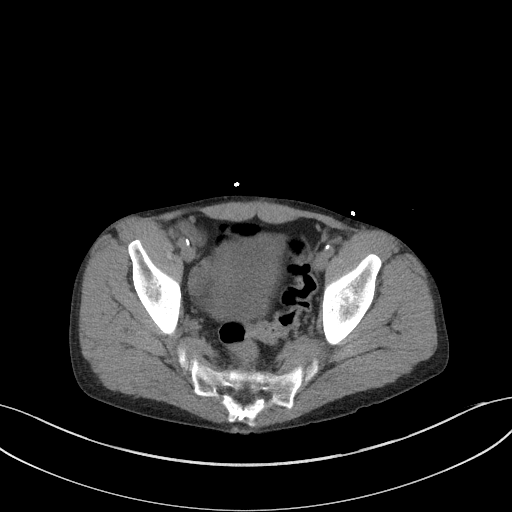
[im 35/96  soft-tissue]
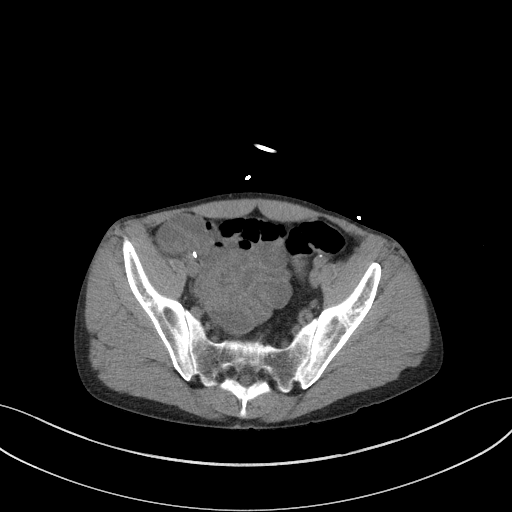
[im 44/96  soft-tissue]
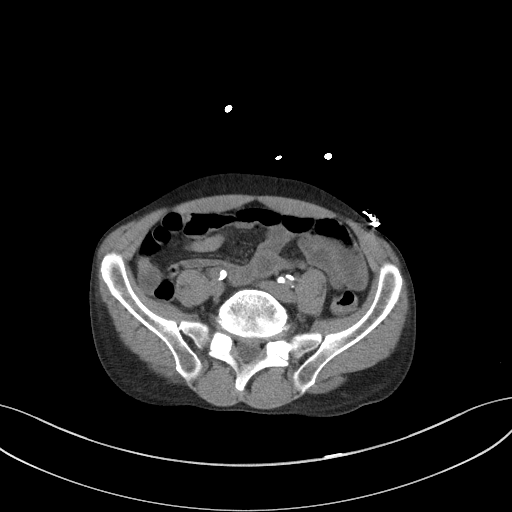
[im 52/96  soft-tissue]
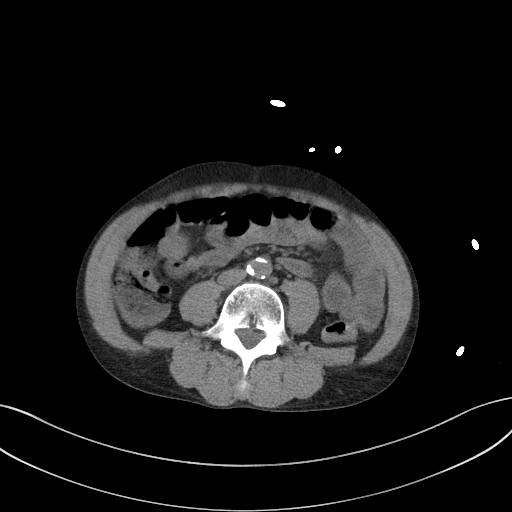
[im 61/96  soft-tissue]
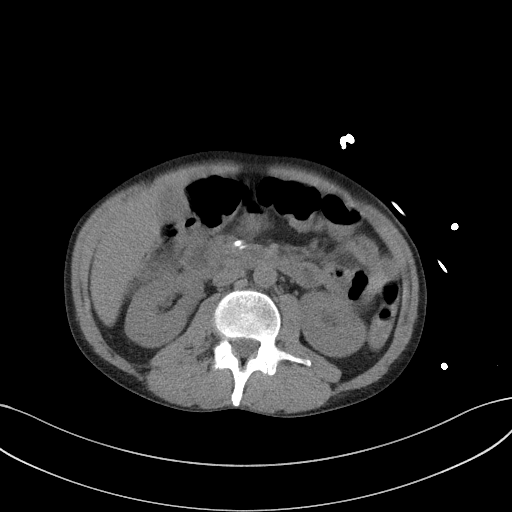
[im 65/96  soft-tissue]
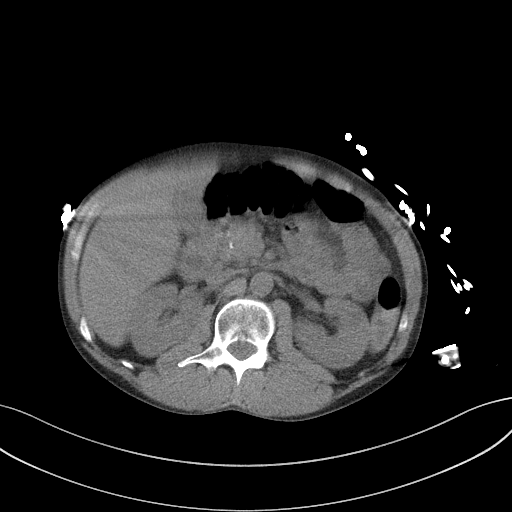
[im 65/96  bone]
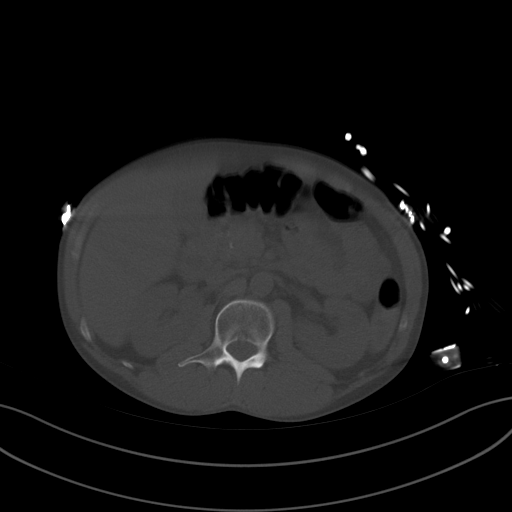
[im 74/96  soft-tissue]
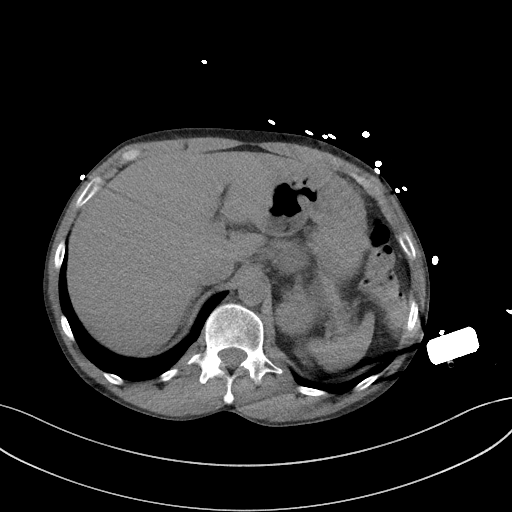
[im 83/96  soft-tissue]
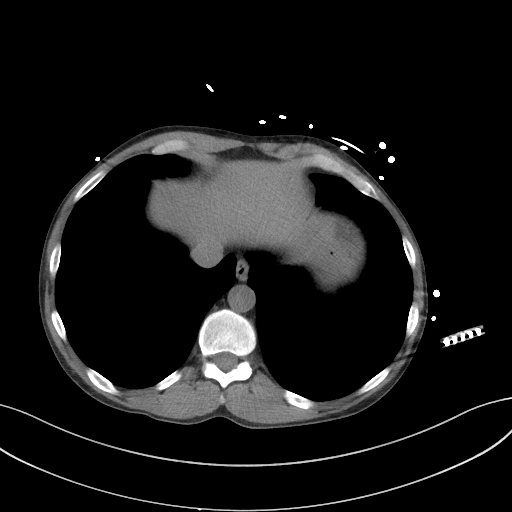
[im 91/96  soft-tissue]
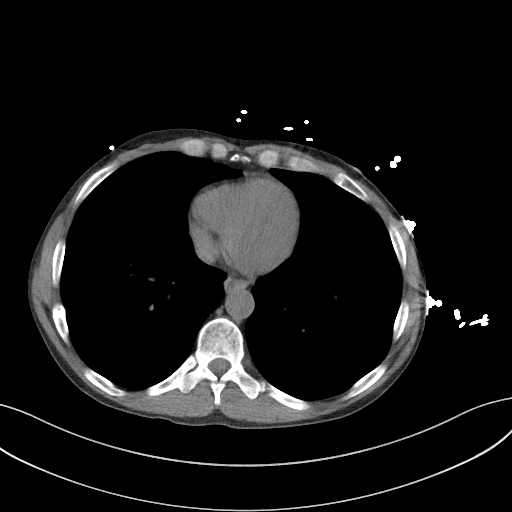

[Series 6: a/p w/o cor · coronal · non-contrast · 0.72mm/px · 3 of 147 slices shown]
[im 49/147  soft-tissue]
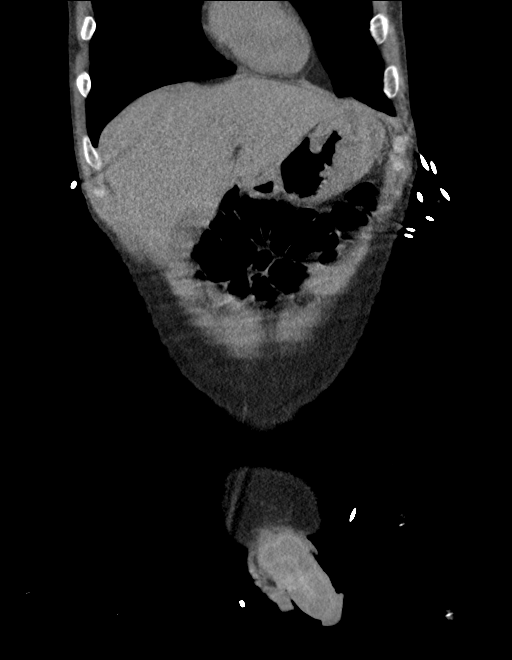
[im 65/147  soft-tissue]
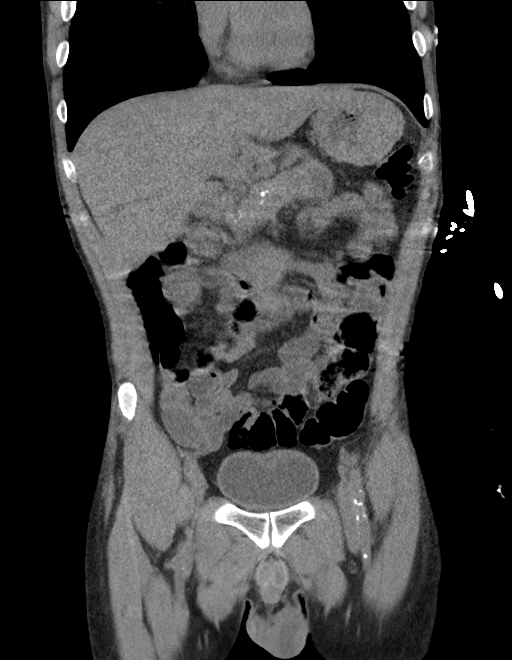
[im 82/147  soft-tissue]
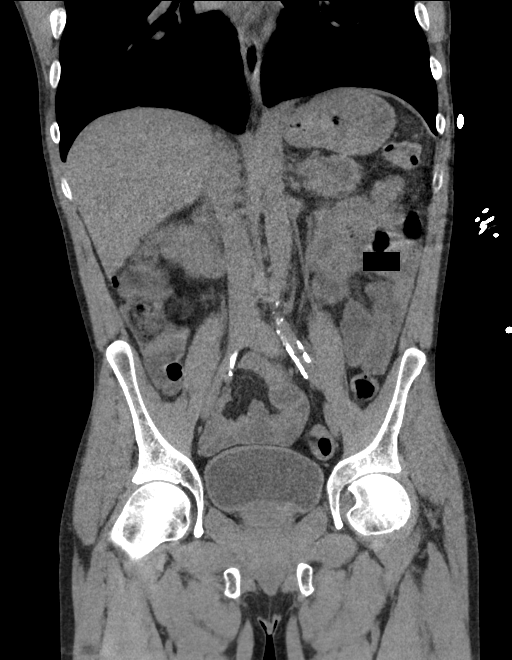

[15 of 46 positions shown; findings below may reference images not displayed]

FINDINGS: Images obscured by streak artifact from external metallic objects,
limiting assessment.

Lower chest: No significant pulmonary nodules or acute consolidative
airspace disease. Healed deformities in the lateral right seventh
and eighth ribs.

Hepatobiliary: Normal liver size. No liver mass. Normal gallbladder
with no radiopaque cholelithiasis. No biliary ductal dilatation.

Pancreas: Diffuse coarse pancreatic parenchymal calcifications, most
prominent in the pancreatic head, unchanged, compatible with chronic
pancreatitis. Marked beaded dilatation of main pancreatic duct in
the pancreatic body and tail up to 3.2 cm diameter, previously
cm, mildly worsened. No definite acute peripancreatic fat stranding
or fluid collections.

Spleen: Normal size. No mass.

Adrenals/Urinary Tract: Normal adrenals. No renal stones. No
hydronephrosis. No contour deforming renal masses. Normal bladder.

Stomach/Bowel: Normal non-distended stomach. No dilated small bowel
loops. Scattered fluid levels in the small bowel. No small bowel
wall thickening. Normal appendix. Few scattered fluid levels in the
large bowel. No large bowel wall thickening, diverticulosis or
pericolonic fat stranding.

Vascular/Lymphatic: Atherosclerotic nonaneurysmal abdominal aorta.
No pathologically enlarged lymph nodes in the abdomen or pelvis.

Reproductive: Normal size prostate.

Other: No pneumoperitoneum, ascites or focal fluid collection.

Musculoskeletal: No aggressive appearing focal osseous lesions. Mild
degenerative disc disease in the lower lumbar spine.
IMPRESSION: 1. Clear lung bases.
2. No evidence of bowel obstruction.
3. Scattered fluid levels in the small and large bowel, suggesting
ileus / malabsorptive state. No bowel wall thickening.
4. Chronic pancreatitis. Marked beaded dilatation of the main
pancreatic duct in the pancreatic body and tail, mildly worsened
since earlier noncontrast CT study. No evidence of superimposed
acute pancreatitis.
5. Aortic Atherosclerosis ([id number]).

## 2022-02-11 ENCOUNTER — Ambulatory Visit: Payer: Medicaid Other | Admitting: Cardiology

## 2022-02-13 NOTE — Progress Notes (Unsigned)
Cardiology Office Note   Date:  02/14/2022   ID:  [name], DOB 09-01-1970, MRN [id number]  PCP:  Pcp, No  Cardiologist:   Minus Breeding, MD   Chief Complaint  Patient presents with   Chest Pain      History of Present Illness: [name] is a 51 y.o. male who presents wiith a hx of WPW s/p ablation, substance use, alcohol abuse, and alcoholic pancreatitis.  Echocardiogram 2022 with mild concentric LVH.  He has been seen in the ER several times for chest and abdominal pain felt related to chronic pancreatitis.  No coronary disease by heart cath in 2014.  EKG in 2014 with preexcitation ST segment changes consistent with WPW.  Per patient report, he states he underwent WPW ablation at: 2014 or 2015.  We saw him because of tachycardia. He reports having rapid heart rate sometimes in the 190s but mostly 135.  He was recently hospitalized at 12/06/2021 with chest and abdominal pain found to be in A-fib RVR.  He converted spontaneously.  Telemetry notable for some aberrant conduction.  Of note, he had been out of his Toprol for about a month prior to this evaluation.  Toprol was restarted at 100 mg daily.  He does have a history of cocaine use but was tolerating this beta-blocker in the past and is working on negative drug screens to get into pain clinic for pancreatitis.  Given his low CHA2DS2-VASc score, decision was made to not anticoagulate.  Echocardiogram with preserved EF and no significant valvular disease.  He presents in the office in August after recurrent pancreatitis.   Because of pain and the atrial fib he had at that admission  arranged for NM stress myoview. For palpitation, he was placed on heart monitor and told to take additional metoprolol. NM stress myoview from 01/12/22 showed intermediate risk study with no ischemia, LVEF 47% with global hypokinesis.  He was back in the hospital ED with chest pain and palpitatoins.  He had tachycardia and was treated with  adenosine by EMS.  Trigger seems to be alcohol and/or abdominal pain and recurrent pancreatitis.  I was going to have him see the EP physicians during that admission but he left the hospital AMA.    Since leaving the hospital he has noticed his heart rate to be elevated still.  It looks like he is persistently in fibrillation.  He has been taking his medications per his report.  He still gets chest discomfort and he points to a focal area of discomfort under his left breast.  It is not tender to palpation but it does worsen with certain movements or inspiration.  He is asking about anxiety medicines or pain medications.  He is not having any new shortness of breath, PND or orthopnea.  Is not having any new palpitations, presyncope or syncope.  Unfortunately he still drinking some alcohol.  He smokes and he does vape.  Past Medical History:  Diagnosis Date   Alcoholism /alcohol abuse    Anemia    Anxiety    Arthritis    "knees; arms; elbows" (03/26/2015)   Asthma    Bipolar disorder (HCC)    Chronic bronchitis (HCC)    Chronic lower back pain    Chronic pancreatitis ([place])    Cocaine abuse ([place])    Depression    Family history of adverse reaction to anesthesia    "grandmother gets confused"   Femoral condyle fracture ([place]) 03/08/2014   left  medial/notes 03/09/2014   GERD (gastroesophageal reflux disease)    H/O hiatal hernia    H/O suicide attempt 10/2012   High cholesterol    History of blood transfusion 10/2012   "when I tried to commit suicide"   History of stomach ulcers    Hypertension    Marijuana abuse, continuous    Migraine    "a few times/year" (03/26/2015)   Pneumonia 1990's X 3   PTSD (post-traumatic stress disorder)    Sickle cell trait ([place])    WPW (Wolff-Parkinson-White syndrome)    [name] 03/06/2013    Past Surgical History:  Procedure Laterality Date   BIOPSY  11/25/2017   Procedure: BIOPSY;  Surgeon: Arta Silence, MD;  Location: [place];  Service:  Endoscopy;;   BIOPSY  10/14/2018   Procedure: BIOPSY;  Surgeon: Arta Silence, MD;  Location: [place];  Service: Endoscopy;;   CARDIAC CATHETERIZATION     CYST ENTEROSTOMY  01/02/2020   Procedure: CYST ASPIRATION;  Surgeon: Milus Banister, MD;  Location: [facility];  Service: Endoscopy;;   ESOPHAGOGASTRODUODENOSCOPY (EGD) WITH PROPOFOL N/A 11/25/2017   Procedure: ESOPHAGOGASTRODUODENOSCOPY (EGD) WITH PROPOFOL;  Surgeon: Arta Silence, MD;  Location: [facility] ENDOSCOPY;  Service: Endoscopy;  Laterality: N/A;   ESOPHAGOGASTRODUODENOSCOPY (EGD) WITH PROPOFOL Left 10/14/2018   Procedure: ESOPHAGOGASTRODUODENOSCOPY (EGD) WITH PROPOFOL;  Surgeon: Arta Silence, MD;  Location: [facility] ENDOSCOPY;  Service: Endoscopy;  Laterality: Left;   ESOPHAGOGASTRODUODENOSCOPY (EGD) WITH PROPOFOL N/A 11/14/2018   Procedure: ESOPHAGOGASTRODUODENOSCOPY (EGD) WITH PROPOFOL;  Surgeon: Laurence Spates, MD;  Location: [facility];  Service: Gastroenterology;  Laterality: N/A;   ESOPHAGOGASTRODUODENOSCOPY (EGD) WITH PROPOFOL N/A 01/02/2020   Procedure: ESOPHAGOGASTRODUODENOSCOPY (EGD) WITH PROPOFOL;  Surgeon: Milus Banister, MD;  Location: [facility];  Service: Endoscopy;  Laterality: N/A;   ESOPHAGOGASTRODUODENOSCOPY (EGD) WITH PROPOFOL N/A 10/25/2020   Procedure: ESOPHAGOGASTRODUODENOSCOPY (EGD) WITH PROPOFOL;  Surgeon: Rush Landmark Telford Nab., MD;  Location: [place];  Service: Gastroenterology;  Laterality: N/A;   EUS N/A 01/02/2020   Procedure: UPPER ENDOSCOPIC ULTRASOUND (EUS) RADIAL;  Surgeon: Milus Banister, MD;  Location: [facility];  Service: Endoscopy;  Laterality: N/A;   EYE SURGERY Left 1990's   "result of trauma"    FACIAL FRACTURE SURGERY Left 1990's   "result of trauma"    FLEXIBLE SIGMOIDOSCOPY N/A 10/25/2020   Procedure: FLEXIBLE SIGMOIDOSCOPY;  Surgeon: Irving Copas., MD;  Location: [place];  Service: Gastroenterology;  Laterality: N/A;   FRACTURE SURGERY     HEMOSTASIS CLIP  PLACEMENT  10/25/2020   Procedure: HEMOSTASIS CLIP PLACEMENT;  Surgeon: Irving Copas., MD;  Location: [place];  Service: Gastroenterology;;   HERNIA REPAIR     LEFT HEART CATHETERIZATION WITH CORONARY ANGIOGRAM Right 03/07/2013   Procedure: LEFT HEART CATHETERIZATION WITH CORONARY ANGIOGRAM;  Surgeon: [name], MD;  Location: [place] CATH LAB;  Service: Cardiovascular;  Laterality: Right;   UMBILICAL HERNIA REPAIR     UPPER GASTROINTESTINAL ENDOSCOPY       Current Outpatient Medications  Medication Sig Dispense Refill   acetaminophen (TYLENOL) 500 MG tablet Take 1,000 mg by mouth every 6 (six) hours as needed for moderate pain or headache.     albuterol (VENTOLIN HFA) 108 (90 Base) MCG/ACT inhaler Inhale 2 puffs into the lungs every 4 (four) hours as needed for wheezing or shortness of breath. 18 g 0   amLODipine (NORVASC) 10 MG tablet Take 1 tablet (10 mg total) by mouth daily. 30 tablet 0   folic acid (FOLVITE) 1 MG tablet Take 1  tablet (1 mg total) by mouth daily. 30 tablet 0   HYDROcodone-acetaminophen (NORCO/VICODIN) 5-325 MG tablet Take 1 tablet by mouth every 12 (twelve) hours as needed for moderate pain. 15 tablet 0   lidocaine (LMX) 4 % cream Apply topically 4 (four) times daily as needed (NGT pain). 30 g 0   lipase/protease/amylase (CREON) 36000 UNITS CPEP capsule Take 2 capsules (72,000 Units total) by mouth 3 (three) times daily with meals. 180 capsule 0   magnesium oxide (MAG-OX) 400 (240 Mg) MG tablet Take 1 tablet (400 mg total) by mouth daily. 8 tablet 0   metoprolol succinate (TOPROL XL) 25 MG 24 hr tablet Take 1 tablet (25 mg total) by mouth as needed (for palpitations). To not exceed 1 dose per day. (Patient taking differently: Take 50 mg by mouth daily.) 30 tablet 3   Multiple Vitamin (MULTIVITAMIN WITH MINERALS) TABS tablet Take 1 tablet by mouth daily. 30 tablet 0   nicotine (NICODERM CQ - DOSED IN MG/24 HOURS) 21 mg/24hr patch Place 1 patch (21 mg total)  onto the skin at bedtime. 28 patch 0   nitroGLYCERIN (NITROSTAT) 0.4 MG SL tablet Place 1 tablet (0.4 mg total) under the tongue every 5 (five) minutes as needed for chest pain. Do not exceed 3 doses in a row. 25 tablet 3   ondansetron (ZOFRAN) 4 MG tablet Take 1 tablet (4 mg total) by mouth every 6 (six) hours. 12 tablet 0   oxyCODONE-acetaminophen (PERCOCET/ROXICET) 5-325 MG tablet Take 1 tablet by mouth every 6 (six) hours as needed for severe pain. 5 tablet 0   pantoprazole (PROTONIX) 40 MG tablet Take 1 tablet (40 mg total) by mouth daily. 30 tablet 0   Polyethyl Glycol-Propyl Glycol (SYSTANE OP) Place 1 drop into both eyes 4 (four) times daily as needed (for dryness).     sucralfate (CARAFATE) 1 g tablet Take 1 tablet (1 g total) by mouth 4 (four) times daily -  with meals and at bedtime. 30 tablet 0   thiamine 100 MG tablet Take 1 tablet (100 mg total) by mouth daily. 30 tablet 0   Vitamin D, Ergocalciferol, (DRISDOL) 1.25 MG (50000 UNIT) CAPS capsule Take 50,000 Units by mouth every Wednesday.     metoprolol succinate (TOPROL-XL) 200 MG 24 hr tablet Take 1 tablet (200 mg total) by mouth daily. Take with or immediately following a meal. 90 tablet 3   No current facility-administered medications for this visit.    Allergies:   Robaxin [methocarbamol], Aspirin, Shellfish-derived products, Trazodone and nefazodone, Adhesive [tape], Latex, Toradol [ketorolac tromethamine], Contrast media [iodinated contrast media], Reglan [metoclopramide], and Salmon [fish oil]     ROS:  Please see the history of present illness.   Otherwise, review of systems are positive for none.   All other systems are reviewed and negative.    PHYSICAL EXAM: VS:  BP (!) 149/99   Pulse (!) 110   Ht '5\' 9"'$  (1.753 m)   Wt 123 lb 3.2 oz (55.9 kg)   SpO2 100%   BMI 18.19 kg/m  , BMI Body mass index is 18.19 kg/m. GENERAL:  Well appearing HEENT:  Pupils equal round and reactive, fundi not visualized, oral mucosa  unremarkable NECK:  No jugular venous distention, waveform within normal limits, carotid upstroke brisk and symmetric, no bruits, no thyromegaly LYMPHATICS:  No cervical, inguinal adenopathy LUNGS:  Clear to auscultation bilaterally BACK:  No CVA tenderness CHEST:  Unremarkable HEART:  PMI not displaced or sustained,S1 and S2 within normal  limits, no S3, no clicks, no rubs, no murmurs, irregular ABD:  Flat, positive bowel sounds normal in frequency in pitch, no bruits, no rebound, no guarding, no midline pulsatile mass, no hepatomegaly, no splenomegaly EXT:  2 plus pulses throughout, no edema, no cyanosis no clubbing SKIN:  No rashes no nodules NEURO:  Cranial nerves II through XII grossly intact, motor grossly intact throughout PSYCH:  Cognitively intact, oriented to person place and time    EKG:  EKG is ordered today. The ekg ordered today demonstrates atrial fibrillation with a rapid rate, rate 110, anterolateral T wave inversions unchanged from previous.   Recent Labs: 01/20/2022: B Natriuretic Peptide 116.7; TSH 2.161 01/21/2022: ALT 37; BUN <5; Creatinine, Ser 0.66; Hemoglobin 12.2; Magnesium 2.0; Platelets 152; Potassium 3.8; Sodium 141    Lipid Panel    Component Value Date/Time   CHOL 175 10/30/2020 0545   TRIG 142 06/07/2021 0239   HDL 89 10/30/2020 0545   CHOLHDL 2.0 10/30/2020 0545   VLDL 8 10/30/2020 0545   LDLCALC 78 10/30/2020 0545      Wt Readings from Last 3 Encounters:  02/14/22 123 lb 3.2 oz (55.9 kg)  01/11/22 121 lb (54.9 kg)  01/06/22 121 lb 9.6 oz (55.2 kg)      Other studies Reviewed: Additional studies/ records that were reviewed today include: ED records. Review of the above records demonstrates:  Please see elsewhere in the note.     ASSESSMENT AND PLAN:  Chest pain: This has not anginal features.  He had negative enzymes when he was in the emergency room complaining of this pain.  He had no high risk findings on his perfusion study.  He had  a CT to rule out PE in the past.  At this point no change in therapy other than as below.  This appears to be nonanginal pain.  He has follow-up with his pain clinic.    A-fib with RVR: I am going to increase his metoprolol to 200 mg daily.  CHA2DS2-VASc is 1   History of WPW s/p ablation: He has had no real trend tachycardia it seems like he is persistently in fibrillation.    Hypertension: Blood pressure will be controlled in the context of managing his rate.  Pulmonary nodule: He should have follow-up CT in 1 year.     Current medicines are reviewed at length with the patient today.  The patient has concerns regarding medicines.  The following changes have been made:  no change  Labs/ tests ordered today include:   Orders Placed This Encounter  Procedures   EKG 12-Lead     Disposition:   FU with Angie Duke APP in one month     Signed, Minus Breeding, MD  02/14/2022 1:33 PM    [place]

## 2022-02-14 ENCOUNTER — Ambulatory Visit: Payer: Medicaid Other | Attending: Cardiology | Admitting: Cardiology

## 2022-02-14 ENCOUNTER — Encounter: Payer: Self-pay | Admitting: Cardiology

## 2022-02-14 VITALS — BP 149/99 | HR 110 | Ht 69.0 in | Wt 123.2 lb

## 2022-02-14 DIAGNOSIS — I10 Essential (primary) hypertension: Secondary | ICD-10-CM

## 2022-02-14 DIAGNOSIS — I4891 Unspecified atrial fibrillation: Secondary | ICD-10-CM | POA: Diagnosis not present

## 2022-02-14 DIAGNOSIS — R072 Precordial pain: Secondary | ICD-10-CM | POA: Diagnosis not present

## 2022-02-14 IMAGING — DX DG ABDOMEN 1V
1 series · 1 of 1 positions shown · non-contrast
Comparison: 08/08/2019

CLINICAL DATA: Abdominal pain, history of peptic ulcer disease and
chronic pancreatitis

EXAM:
ABDOMEN - 1 VIEW

[abdomen kub]
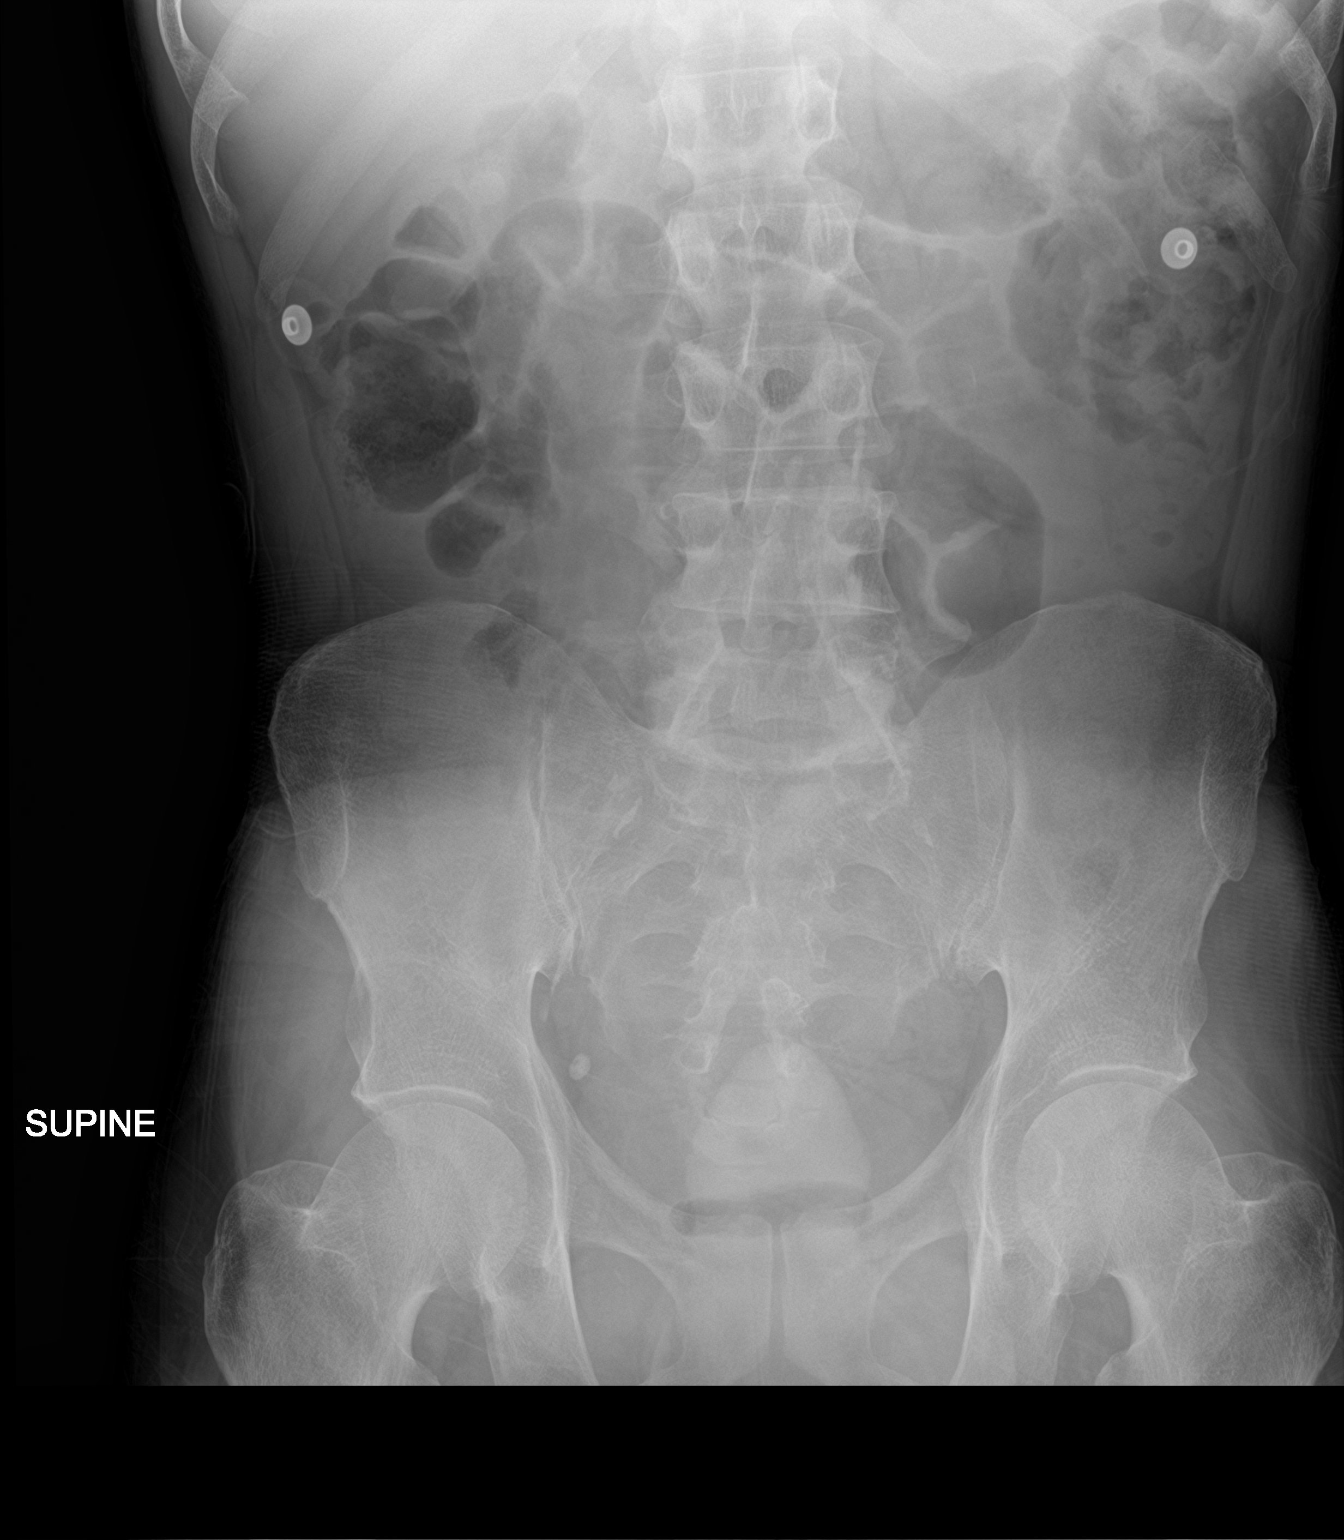

[1 of 1 positions shown; findings below may reference images not displayed]

FINDINGS: Supine frontal view of the abdomen and pelvis excludes the
hemidiaphragms by collimation. Bowel gas pattern is unremarkable
without obstruction or ileus. No masses or abnormal calcifications.
No acute bony abnormality.
IMPRESSION: 1. Unremarkable bowel gas pattern.

## 2022-02-14 IMAGING — DX DG CHEST 2V
3 series · 3 of 3 positions shown · non-contrast
Comparison: 03/02/2019

CLINICAL DATA: Chest and abdominal pain

EXAM:
CHEST - 2 VIEW

[chest lat (1 of 2)]
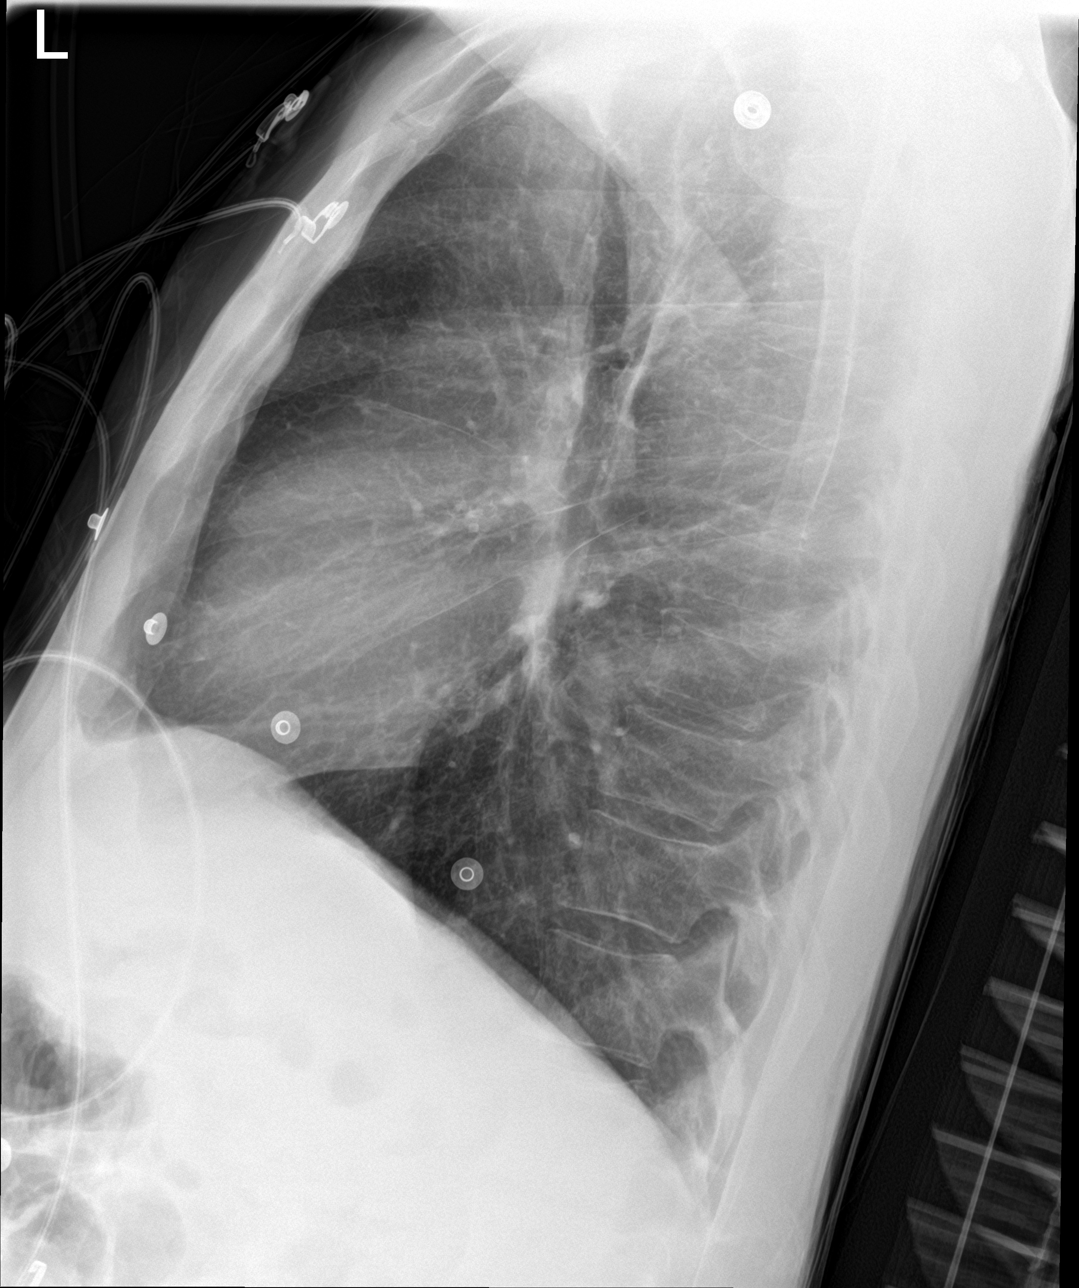

[chest ap]
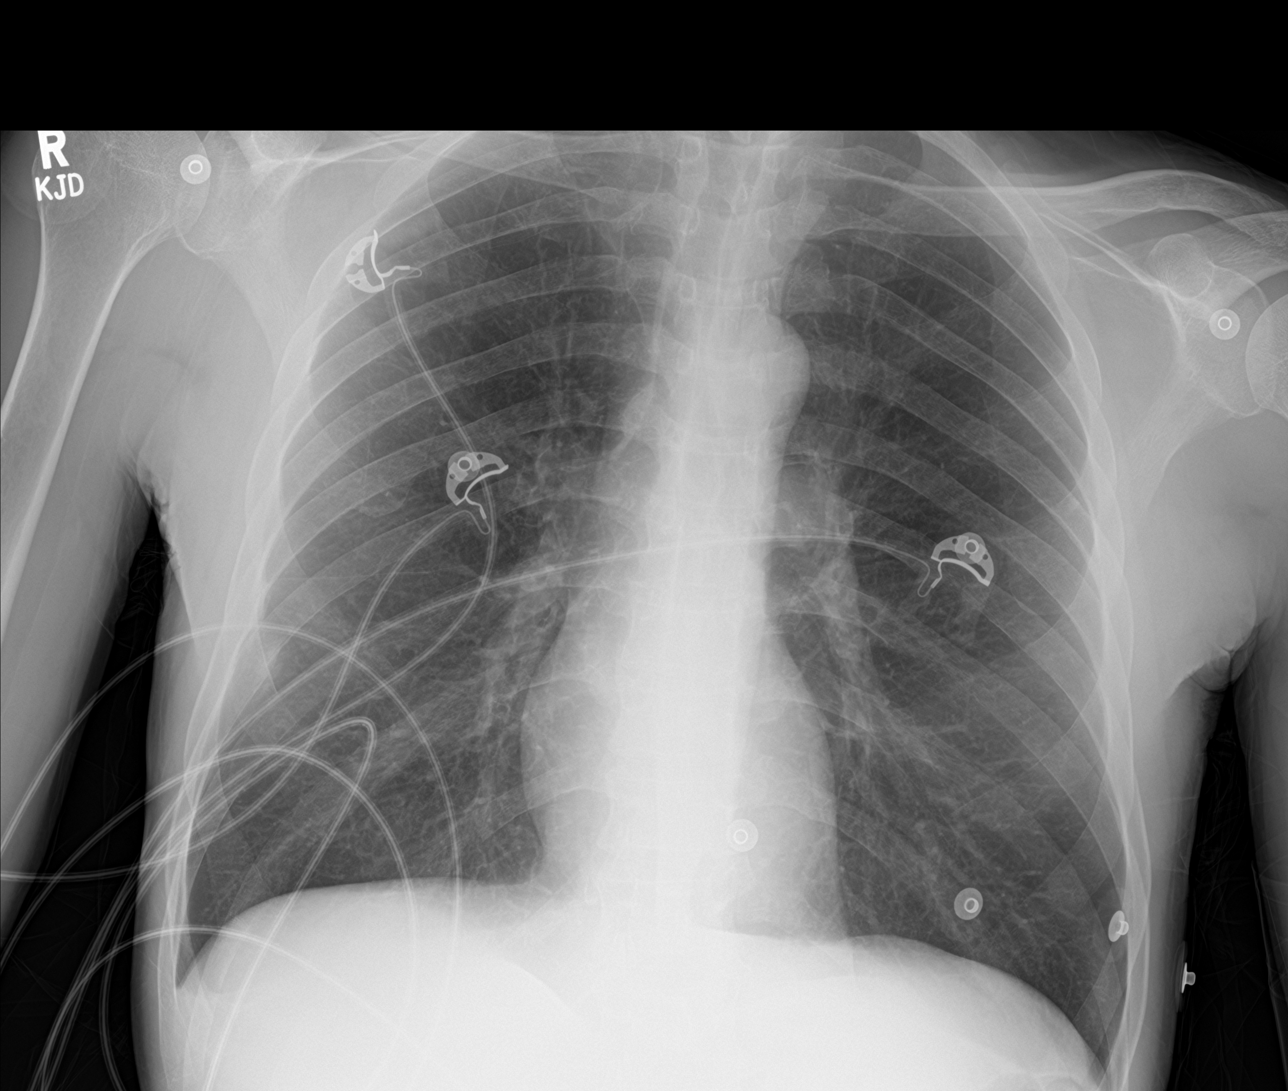

[chest lat (2 of 2)]
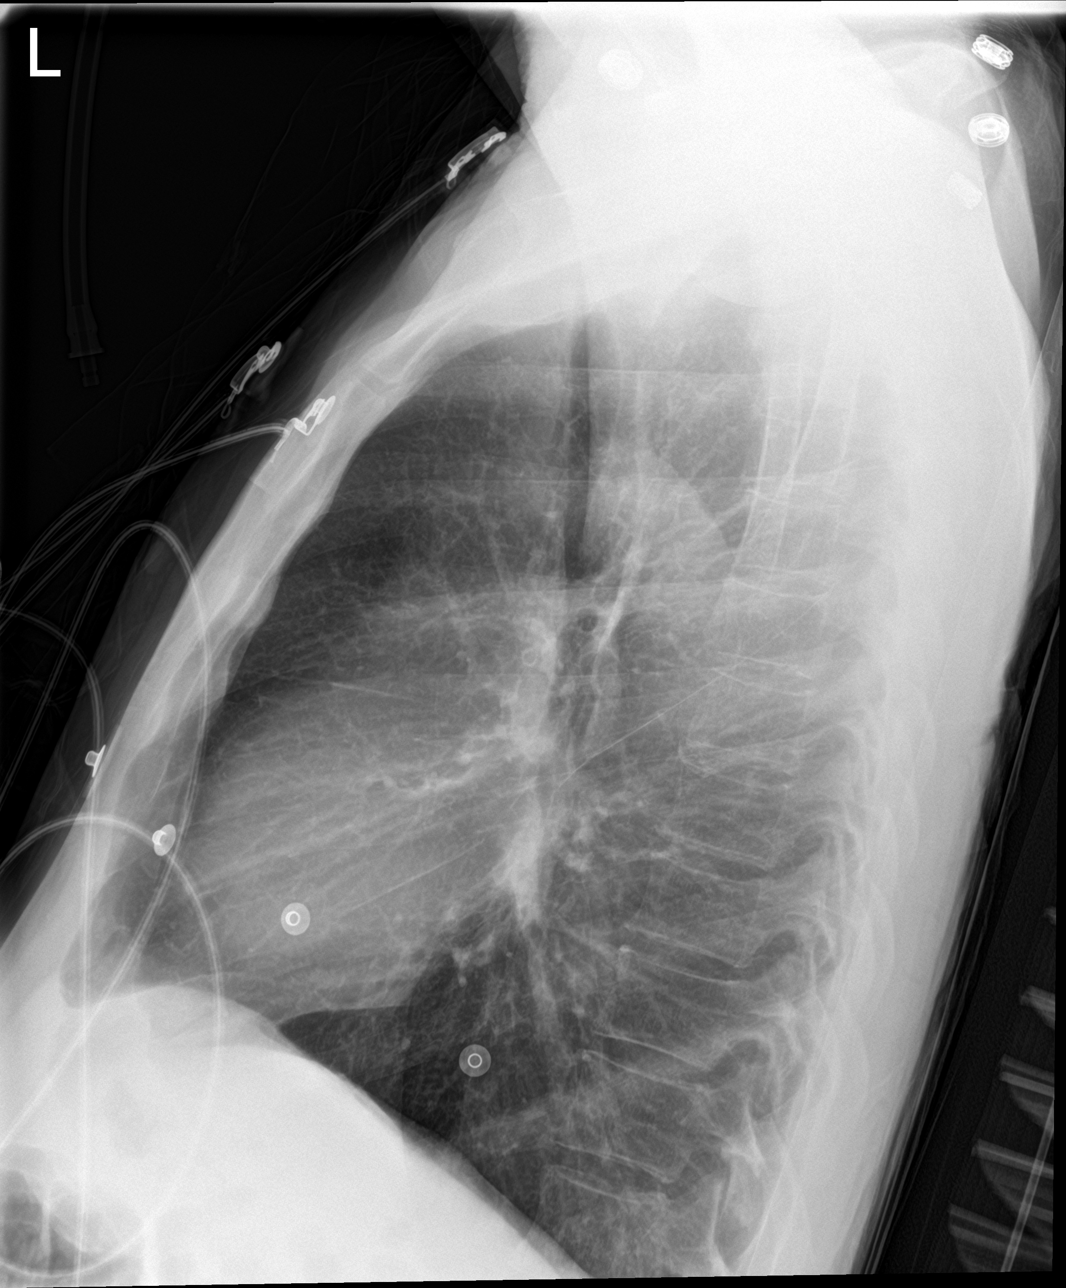

[3 of 3 positions shown; findings below may reference images not displayed]

FINDINGS: Frontal and lateral views of the chest demonstrate an unremarkable
cardiac silhouette. No airspace disease, effusion, or pneumothorax.
No acute bony abnormalities.
IMPRESSION: 1. No acute intrathoracic process.

## 2022-02-14 MED ORDER — METOPROLOL SUCCINATE ER 200 MG PO TB24: 200.0000 mg | ORAL_TABLET | Freq: Every day | ORAL | 3 refills | Status: DC

## 2022-02-14 NOTE — Patient Instructions (Signed)
Medication Instructions:   -Increase toprol-xl to '200mg'$  once daily.  *If you need a refill on your cardiac medications before your next appointment, please call your pharmacy*    Follow-Up: At [facility], you and your health needs are our priority.  As part of our continuing mission to provide you with exceptional heart care, we have created designated Provider Care Teams.  These Care Teams include your primary Cardiologist (physician) and Advanced Practice Providers (APPs -  Physician Assistants and Nurse Practitioners) who all work together to provide you with the care you need, when you need it.  We recommend signing up for the patient portal called "MyChart".  Sign up information is provided on this After Visit Summary.  MyChart is used to connect with patients for Virtual Visits (Telemedicine).  Patients are able to view lab/test results, encounter notes, upcoming appointments, etc.  Non-urgent messages can be sent to your provider as well.   To learn more about what you can do with MyChart, go to [email or web address].    Your next appointment:   1 month(s)  The format for your next appointment:   In Person  Provider:   Fabian Sharp, PA-C

## 2022-02-16 ENCOUNTER — Telehealth: Payer: Self-pay | Admitting: Physician Assistant

## 2022-02-16 ENCOUNTER — Encounter (HOSPITAL_COMMUNITY): Payer: Self-pay | Admitting: Emergency Medicine

## 2022-02-16 ENCOUNTER — Emergency Department (HOSPITAL_COMMUNITY)
Admission: EM | Admit: 2022-02-16 | Discharge: 2022-02-17 | Disposition: A | Discharge status: Home / Self Care | Payer: Medicaid Other | Attending: Emergency Medicine | Admitting: Emergency Medicine

## 2022-02-16 ENCOUNTER — Other Ambulatory Visit: Payer: Self-pay

## 2022-02-16 ENCOUNTER — Ambulatory Visit (HOSPITAL_COMMUNITY): Payer: Medicaid Other | Attending: Physician Assistant

## 2022-02-16 DIAGNOSIS — R072 Precordial pain: Secondary | ICD-10-CM | POA: Insufficient documentation

## 2022-02-16 DIAGNOSIS — R079 Chest pain, unspecified: Secondary | ICD-10-CM | POA: Insufficient documentation

## 2022-02-16 DIAGNOSIS — R1013 Epigastric pain: Secondary | ICD-10-CM | POA: Diagnosis not present

## 2022-02-16 DIAGNOSIS — R197 Diarrhea, unspecified: Secondary | ICD-10-CM | POA: Insufficient documentation

## 2022-02-16 DIAGNOSIS — Z91040 Latex allergy status: Secondary | ICD-10-CM | POA: Diagnosis not present

## 2022-02-16 DIAGNOSIS — R0602 Shortness of breath: Secondary | ICD-10-CM | POA: Insufficient documentation

## 2022-02-16 DIAGNOSIS — R0789 Other chest pain: Secondary | ICD-10-CM

## 2022-02-16 DIAGNOSIS — Z79899 Other long term (current) drug therapy: Secondary | ICD-10-CM | POA: Insufficient documentation

## 2022-02-16 DIAGNOSIS — R112 Nausea with vomiting, unspecified: Secondary | ICD-10-CM | POA: Diagnosis not present

## 2022-02-16 DIAGNOSIS — I10 Essential (primary) hypertension: Secondary | ICD-10-CM | POA: Insufficient documentation

## 2022-02-16 DIAGNOSIS — R55 Syncope and collapse: Secondary | ICD-10-CM | POA: Insufficient documentation

## 2022-02-16 DIAGNOSIS — R5383 Other fatigue: Secondary | ICD-10-CM | POA: Diagnosis not present

## 2022-02-16 LAB — ECHOCARDIOGRAM LIMITED
Area-P 1/2: 3.64 cm2
S' Lateral: 2.2 cm

## 2022-02-16 LAB — CBC WITH DIFFERENTIAL/PLATELET
Abs Immature Granulocytes: 0.04 10*3/uL (ref 0.00–0.07)
Basophils Absolute: 0 10*3/uL (ref 0.0–0.1)
Basophils Relative: 0 %
Eosinophils Absolute: 0.2 10*3/uL (ref 0.0–0.5)
Eosinophils Relative: 2 %
HCT: 42 % (ref 39.0–52.0)
Hemoglobin: 14.1 g/dL (ref 13.0–17.0)
Immature Granulocytes: 1 %
Lymphocytes Relative: 47 %
Lymphs Abs: 3.1 10*3/uL (ref 0.7–4.0)
MCH: 29.4 pg (ref 26.0–34.0)
MCHC: 33.6 g/dL (ref 30.0–36.0)
MCV: 87.7 fL (ref 80.0–100.0)
Monocytes Absolute: 0.4 10*3/uL (ref 0.1–1.0)
Monocytes Relative: 6 %
Neutro Abs: 3 10*3/uL (ref 1.7–7.7)
Neutrophils Relative %: 44 %
Platelets: 165 10*3/uL (ref 150–400)
RBC: 4.79 MIL/uL (ref 4.22–5.81)
RDW: 19.3 % — ABNORMAL HIGH (ref 11.5–15.5)
WBC: 6.7 10*3/uL (ref 4.0–10.5)
nRBC: 0 % (ref 0.0–0.2)

## 2022-02-16 NOTE — Telephone Encounter (Signed)
Patient reports having chest pain during and after echo this morning. He is having active chest pain at this time 5/10 stabbing pain with sob. BP 133/103, P 81. His NTG is not with him. Recommended he be taken to the ED. He stated he will go to [facility] ED.

## 2022-02-16 NOTE — Telephone Encounter (Signed)
Mr [name] stopped by check out today after his ECHO 02/16/22 and stated he would like a call back to discuss his chest pain. I told him that she is who ordered his ECHO today and we would have his results within few days. Please call him at [phone].

## 2022-02-16 NOTE — ED Triage Notes (Signed)
Pt brought to ED with c/o substernal chest pain that radiates across left chest that has been present since approximately 3pm. Pt has hx of WPW syndrome.   EMS Interventions  Morphine '6mg'$  IVP Zofran '4mg'$    EMS Vitals  BP 130/88 HR 84 RR 16 SPO2 100%

## 2022-02-16 NOTE — ED Provider Triage Note (Signed)
Emergency Medicine Provider Triage Evaluation Note  [name] , a 51 y.o. male  was evaluated in triage.  Pt complains of chest pain today, central, constant, no alleviating/aggravating factors. Also having nausea, dyspnea, and abdominal discomfort. Received zofran w/ triage which helped the nausea. Seen by PCP and had increase in metorpolol today after discussing his sxs, he felt worse prompting ED visit. Denies leg swelling, vomiting, syncope.   Review of Systems  Per above  Physical Exam  BP (!) 122/90 (BP Location: Right Arm)   Pulse 83   Temp 98 F (36.7 C) (Oral)   Resp 17   SpO2 98%  Gen:   Awake, no distress   Resp:  Normal effort  MSK:   Moves extremities without difficulty  Other:  No peripheral edema noted. Chest wall TTP.   Medical Decision Making  Medically screening exam initiated at 10:23 PM.  Appropriate orders placed.  [name] was informed that the remainder of the evaluation will be completed by another provider, this initial triage assessment does not replace that evaluation, and the importance of remaining in the ED until their evaluation is complete.  Chest pain.    Amaryllis Dyke, [place] 02/16/22 2235

## 2022-02-17 ENCOUNTER — Emergency Department (HOSPITAL_COMMUNITY): Payer: Medicaid Other

## 2022-02-17 ENCOUNTER — Telehealth: Payer: Self-pay | Admitting: Cardiology

## 2022-02-17 ENCOUNTER — Other Ambulatory Visit: Payer: Self-pay

## 2022-02-17 LAB — CBC WITH DIFFERENTIAL/PLATELET
Abs Immature Granulocytes: 0.04 10*3/uL (ref 0.00–0.07)
Basophils Absolute: 0 10*3/uL (ref 0.0–0.1)
Basophils Relative: 0 %
Eosinophils Absolute: 0.1 10*3/uL (ref 0.0–0.5)
Eosinophils Relative: 1 %
HCT: 39.4 % (ref 39.0–52.0)
Hemoglobin: 13.3 g/dL (ref 13.0–17.0)
Immature Granulocytes: 1 %
Lymphocytes Relative: 30 %
Lymphs Abs: 2.6 10*3/uL (ref 0.7–4.0)
MCH: 30.1 pg (ref 26.0–34.0)
MCHC: 33.8 g/dL (ref 30.0–36.0)
MCV: 89.1 fL (ref 80.0–100.0)
Monocytes Absolute: 0.8 10*3/uL (ref 0.1–1.0)
Monocytes Relative: 9 %
Neutro Abs: 5.2 10*3/uL (ref 1.7–7.7)
Neutrophils Relative %: 59 %
Platelets: 170 10*3/uL (ref 150–400)
RBC: 4.42 MIL/uL (ref 4.22–5.81)
RDW: 19 % — ABNORMAL HIGH (ref 11.5–15.5)
WBC: 8.7 10*3/uL (ref 4.0–10.5)
nRBC: 0 % (ref 0.0–0.2)

## 2022-02-17 LAB — COMPREHENSIVE METABOLIC PANEL
ALT: 62 U/L — ABNORMAL HIGH (ref 0–44)
ALT: 46 U/L — ABNORMAL HIGH (ref 0–44)
AST: 102 U/L — ABNORMAL HIGH (ref 15–41)
AST: 55 U/L — ABNORMAL HIGH (ref 15–41)
Albumin: 4.1 g/dL (ref 3.5–5.0)
Albumin: 4.1 g/dL (ref 3.5–5.0)
Alkaline Phosphatase: 92 U/L (ref 38–126)
Alkaline Phosphatase: 84 U/L (ref 38–126)
Anion gap: 12 (ref 5–15)
Anion gap: 13 (ref 5–15)
BUN: 19 mg/dL (ref 6–20)
BUN: 25 mg/dL — ABNORMAL HIGH (ref 6–20)
CO2: 19 mmol/L — ABNORMAL LOW (ref 22–32)
CO2: 13 mmol/L — ABNORMAL LOW (ref 22–32)
Calcium: 9 mg/dL (ref 8.9–10.3)
Calcium: 8.7 mg/dL — ABNORMAL LOW (ref 8.9–10.3)
Chloride: 99 mmol/L (ref 98–111)
Chloride: 104 mmol/L (ref 98–111)
Creatinine, Ser: 1.11 mg/dL (ref 0.61–1.24)
Creatinine, Ser: 1.05 mg/dL (ref 0.61–1.24)
GFR, Estimated: >60 mL/min (ref >60)
GFR, Estimated: >60 mL/min (ref >60)
Glucose, Bld: 87 mg/dL (ref 70–99)
Glucose, Bld: 90 mg/dL (ref 70–99)
Potassium: 5.5 mmol/L — ABNORMAL HIGH (ref 3.5–5.1)
Potassium: 4.1 mmol/L (ref 3.5–5.1)
Sodium: 130 mmol/L — ABNORMAL LOW (ref 135–145)
Sodium: 130 mmol/L — ABNORMAL LOW (ref 135–145)
Total Bilirubin: 1.3 mg/dL — ABNORMAL HIGH (ref 0.3–1.2)
Total Bilirubin: 1.1 mg/dL (ref 0.3–1.2)
Total Protein: 7.7 g/dL (ref 6.5–8.1)
Total Protein: 7.3 g/dL (ref 6.5–8.1)

## 2022-02-17 LAB — LIPASE, BLOOD: Lipase: 22 U/L (ref 11–51)

## 2022-02-17 LAB — TROPONIN I (HIGH SENSITIVITY)
Troponin I (High Sensitivity): 5 ng/L (ref <18)
Troponin I (High Sensitivity): 5 ng/L (ref <18)

## 2022-02-17 LAB — MAGNESIUM: Magnesium: 1.5 mg/dL — ABNORMAL LOW (ref 1.7–2.4)

## 2022-02-17 IMAGING — DX DG CHEST 2V
2 series · 2 of 2 positions shown · non-contrast
Comparison: 08/25/2019

CLINICAL DATA: Left-sided chest pain and shortness of breath for 5
months, polysubstance abuse

EXAM:
CHEST - 2 VIEW

[w chest pa]
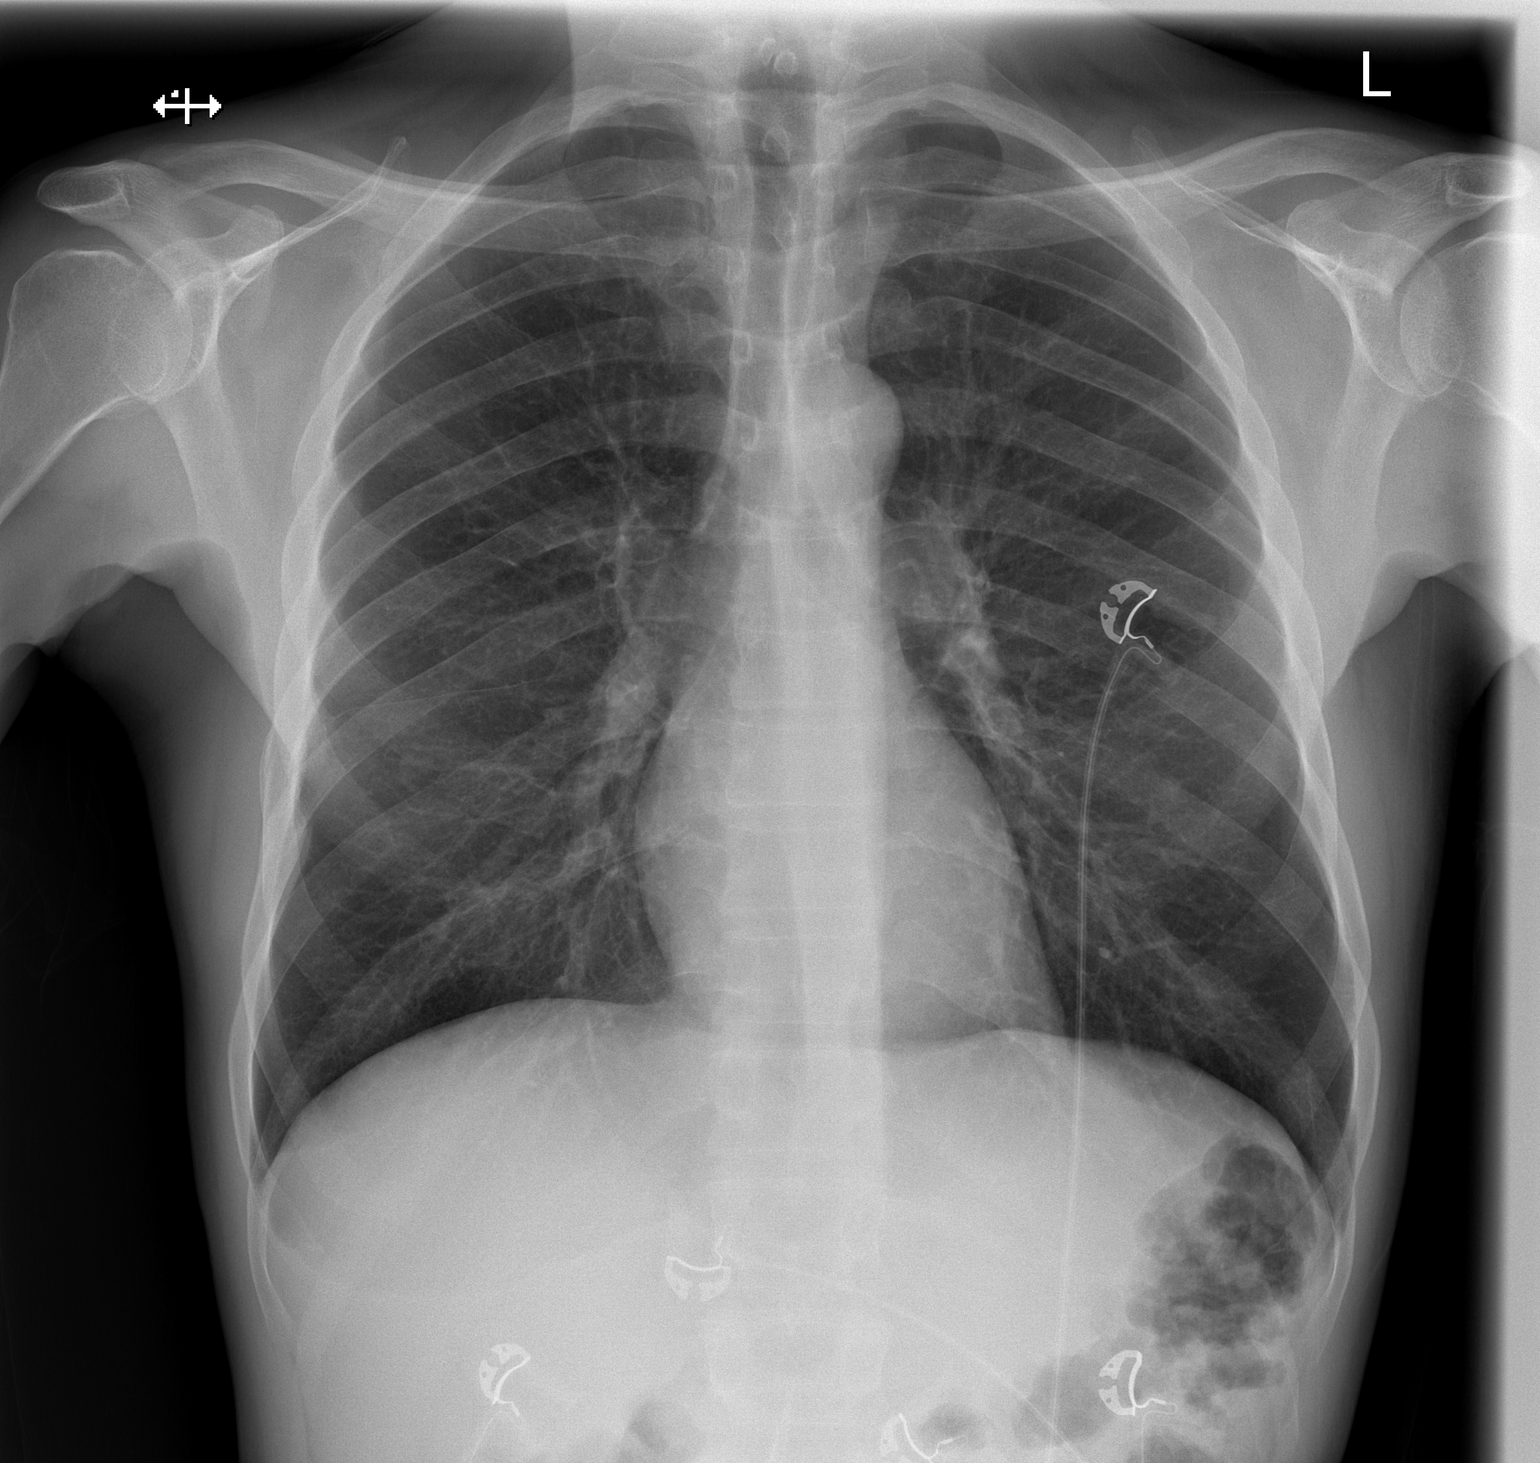

[w chest lat]
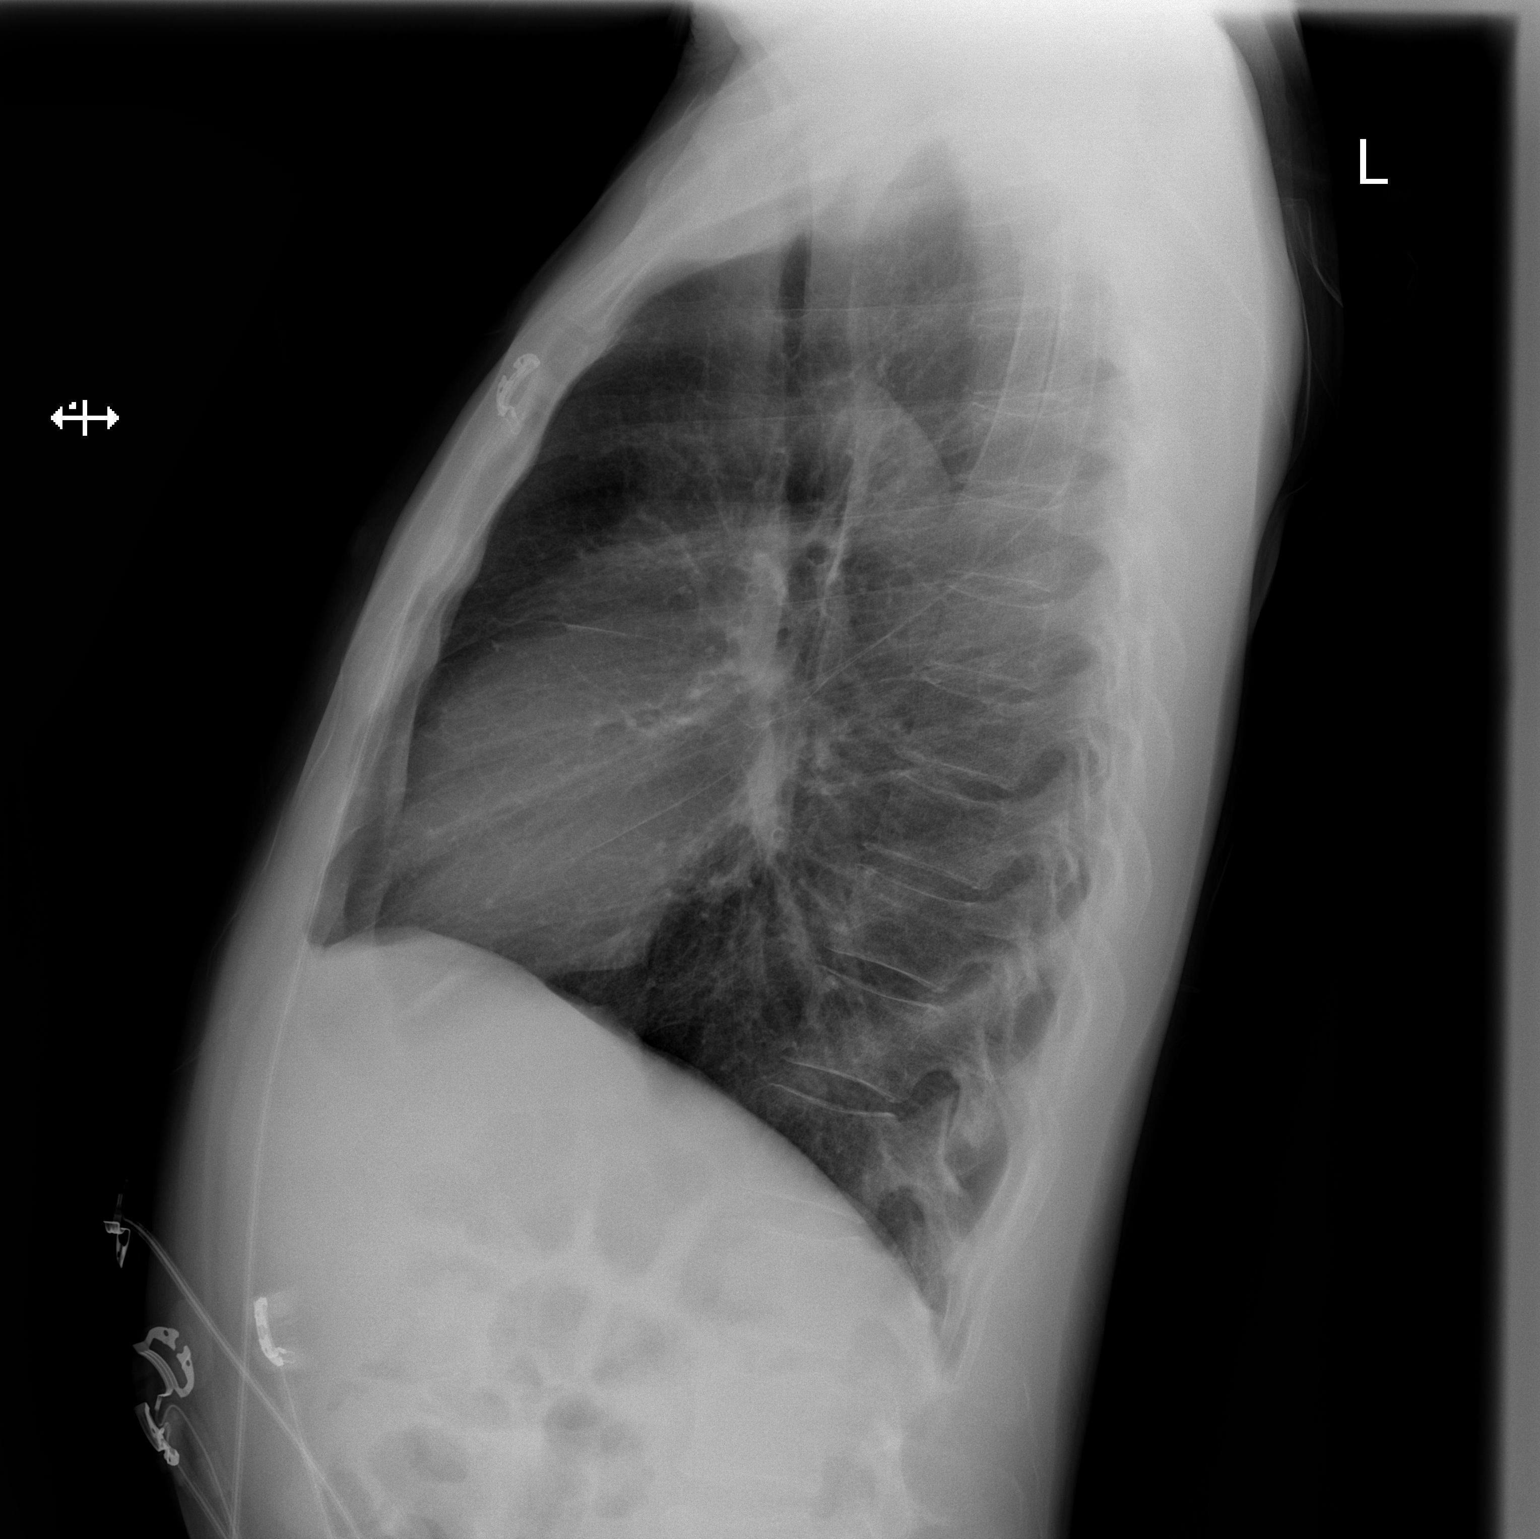

[2 of 2 positions shown; findings below may reference images not displayed]

FINDINGS: The heart size and mediastinal contours are within normal limits.
Both lungs are clear. The visualized skeletal structures are
unremarkable.
IMPRESSION: No active cardiopulmonary disease.

## 2022-02-17 MED ORDER — LIDOCAINE VISCOUS HCL 2 % MT SOLN
15.0000 mL | Freq: Once | OROMUCOSAL | Status: AC
  Administered 2022-02-17: 15 mL via ORAL
  Filled 2022-02-17: qty 15

## 2022-02-17 MED ORDER — MORPHINE SULFATE (PF) 4 MG/ML IV SOLN
4.0000 mg | Freq: Once | INTRAVENOUS | Status: AC
  Administered 2022-02-17: 4 mg via INTRAVENOUS
  Filled 2022-02-17: qty 1

## 2022-02-17 MED ORDER — LOPERAMIDE HCL 2 MG PO CAPS: 2.0000 mg | ORAL_CAPSULE | Freq: Four times a day (QID) | ORAL | 0 refills | Status: DC | PRN

## 2022-02-17 MED ORDER — SODIUM CHLORIDE 0.9 % IV BOLUS
1000.0000 mL | Freq: Once | INTRAVENOUS | Status: AC
  Administered 2022-02-17: 1000 mL via INTRAVENOUS

## 2022-02-17 MED ORDER — OXYCODONE HCL 5 MG PO TABS: 5.0000 mg | ORAL_TABLET | ORAL | 0 refills | Status: DC | PRN

## 2022-02-17 MED ORDER — ONDANSETRON HCL 4 MG PO TABS: 4.0000 mg | ORAL_TABLET | Freq: Four times a day (QID) | ORAL | 0 refills | Status: DC

## 2022-02-17 MED ORDER — ALUM & MAG HYDROXIDE-SIMETH 200-200-20 MG/5ML PO SUSP
30.0000 mL | Freq: Once | ORAL | Status: AC
  Administered 2022-02-17: 30 mL via ORAL
  Filled 2022-02-17: qty 30

## 2022-02-17 MED ORDER — ONDANSETRON HCL 4 MG/2ML IJ SOLN
4.0000 mg | Freq: Once | INTRAMUSCULAR | Status: AC
  Administered 2022-02-17: 4 mg via INTRAVENOUS
  Filled 2022-02-17: qty 2

## 2022-02-17 NOTE — Telephone Encounter (Signed)
Patient called to report he is still at the [facility] ED.  Patient stated he had an EKG done and has been waiting to see a doctor since 9:30 p.m. last night.

## 2022-02-17 NOTE — ED Notes (Signed)
[facility] provider notified and repeating EKG

## 2022-02-17 NOTE — ED Provider Notes (Signed)
[facility] EMERGENCY DEPARTMENT Provider Note   CSN: [id number] Arrival date & time: 02/16/22  2153     History  Chief Complaint  Patient presents with   Chest Pain    [name] is a 51 y.o. male.  The history is provided by the patient and medical records. No language interpreter was used.  Chest Pain Pain location:  Substernal area, epigastric and L chest Pain quality: aching and sharp   Pain radiates to:  L shoulder and L arm Pain severity:  Moderate Onset quality:  Gradual Duration:  2 days Timing:  Constant Progression:  Waxing and waning Chronicity:  Recurrent Relieved by:  Nothing Worsened by:  Nothing Ineffective treatments:  None tried Associated symptoms: abdominal pain, fatigue, heartburn, nausea, near-syncope, shortness of breath and vomiting   Associated symptoms: no altered mental status, no back pain, no cough, no fever, no headache, no lower extremity edema and no palpitations        Home Medications Prior to Admission medications   Medication Sig Start Date End Date Taking? Authorizing Provider  acetaminophen (TYLENOL) 500 MG tablet Take 1,000 mg by mouth every 6 (six) hours as needed for moderate pain or headache.    [provider]  albuterol (VENTOLIN HFA) 108 (90 Base) MCG/ACT inhaler Inhale 2 puffs into the lungs every 4 (four) hours as needed for wheezing or shortness of breath. 08/17/21   [name], MD  amLODipine (NORVASC) 10 MG tablet Take 1 tablet (10 mg total) by mouth daily. 12/25/21   [name], MD  folic acid (FOLVITE) 1 MG tablet Take 1 tablet (1 mg total) by mouth daily. 11/26/17   [name], MD  HYDROcodone-acetaminophen (NORCO/VICODIN) 5-325 MG tablet Take 1 tablet by mouth every 12 (twelve) hours as needed for moderate pain. 12/24/21   [name], MD  lidocaine (LMX) 4 % cream Apply topically 4 (four) times daily as needed (NGT pain). 12/24/21   [name], MD   lipase/protease/amylase (CREON) 36000 UNITS CPEP capsule Take 2 capsules (72,000 Units total) by mouth 3 (three) times daily with meals. 12/24/21   [name], MD  magnesium oxide (MAG-OX) 400 (240 Mg) MG tablet Take 1 tablet (400 mg total) by mouth daily. 11/30/21   Davonna Belling, MD  metoprolol succinate (TOPROL XL) 25 MG 24 hr tablet Take 1 tablet (25 mg total) by mouth as needed (for palpitations). To not exceed 1 dose per day. Patient taking differently: Take 50 mg by mouth daily. 01/06/22   [facility], Tami Lin, PA  metoprolol succinate (TOPROL-XL) 200 MG 24 hr tablet Take 1 tablet (200 mg total) by mouth daily. Take with or immediately following a meal. 02/14/22   Minus Breeding, MD  Multiple Vitamin (MULTIVITAMIN WITH MINERALS) TABS tablet Take 1 tablet by mouth daily. 09/06/17   Bonnell Public, MD  nicotine (NICODERM CQ - DOSED IN MG/24 HOURS) 21 mg/24hr patch Place 1 patch (21 mg total) onto the skin at bedtime. 12/24/21   [name], MD  nitroGLYCERIN (NITROSTAT) 0.4 MG SL tablet Place 1 tablet (0.4 mg total) under the tongue every 5 (five) minutes as needed for chest pain. Do not exceed 3 doses in a row. 01/06/22   [facility], Tami Lin, PA  ondansetron (ZOFRAN) 4 MG tablet Take 1 tablet (4 mg total) by mouth every 6 (six) hours. 11/03/21   Sherrell Puller, PA-C  oxyCODONE-acetaminophen (PERCOCET/ROXICET) 5-325 MG tablet Take 1 tablet by mouth every  6 (six) hours as needed for severe pain. 01/21/22   Drenda Freeze, MD  pantoprazole (PROTONIX) 40 MG tablet Take 1 tablet (40 mg total) by mouth daily. 01/21/22   Drenda Freeze, MD  Polyethyl Glycol-Propyl Glycol (SYSTANE OP) Place 1 drop into both eyes 4 (four) times daily as needed (for dryness).    [provider]  sucralfate (CARAFATE) 1 g tablet Take 1 tablet (1 g total) by mouth 4 (four) times daily -  with meals and at bedtime. 01/21/22   Drenda Freeze, MD  thiamine 100 MG tablet Take 1 tablet (100 mg  total) by mouth daily. 10/16/18   [name], MD  Vitamin D, Ergocalciferol, (DRISDOL) 1.25 MG (50000 UNIT) CAPS capsule Take 50,000 Units by mouth every Wednesday.    [provider]  amitriptyline (ELAVIL) 25 MG tablet Take 1 tablet (25 mg total) by mouth at bedtime. Patient not taking: Reported on 08/08/2019 10/15/18 08/08/19  [name], MD      Allergies    Robaxin [methocarbamol], Aspirin, Shellfish-derived products, Trazodone and nefazodone, Adhesive [tape], Latex, Toradol [ketorolac tromethamine], Contrast media [iodinated contrast media], Reglan [metoclopramide], and Salmon [fish oil]    Review of Systems   Review of Systems  Constitutional:  Positive for fatigue. Negative for chills and fever.  HENT:  Negative for congestion.   Eyes:  Negative for visual disturbance.  Respiratory:  Positive for chest tightness and shortness of breath. Negative for cough and wheezing.   Cardiovascular:  Positive for chest pain and near-syncope. Negative for palpitations and leg swelling.  Gastrointestinal:  Positive for abdominal pain, heartburn, nausea and vomiting. Negative for constipation and diarrhea.  Genitourinary:  Negative for frequency.  Musculoskeletal:  Negative for back pain, neck pain and neck stiffness.  Skin:  Negative for rash and wound.  Neurological:  Positive for light-headedness. Negative for headaches.  Psychiatric/Behavioral:  Negative for agitation and confusion.   All other systems reviewed and are negative.   Physical Exam Updated Vital Signs BP (!) 136/95 (BP Location: Right Arm)   Pulse (!) 50   Temp 97.7 F (36.5 C) (Oral)   Resp 18   SpO2 98%  Physical Exam Vitals and nursing note reviewed.  Constitutional:      General: He is not in acute distress.    Appearance: He is well-developed. He is not ill-appearing, toxic-appearing or diaphoretic.  HENT:     Head: Normocephalic and atraumatic.  Eyes:     Conjunctiva/sclera: Conjunctivae normal.      Pupils: Pupils are equal, round, and reactive to light.  Cardiovascular:     Rate and Rhythm: Regular rhythm.     Heart sounds: No murmur heard. Pulmonary:     Effort: Pulmonary effort is normal. No respiratory distress.     Breath sounds: Normal breath sounds. No decreased breath sounds, wheezing, rhonchi or rales.  Chest:     Chest wall: No tenderness.  Abdominal:     Palpations: Abdomen is soft.     Tenderness: There is no abdominal tenderness.  Musculoskeletal:        General: No swelling.     Cervical back: Neck supple.     Right lower leg: No tenderness. No edema.     Left lower leg: No tenderness. No edema.  Skin:    General: Skin is warm and dry.     Capillary Refill: Capillary refill takes less than 2 seconds.     Findings: No erythema or rash.  Neurological:     Mental Status: He is alert.  Psychiatric:        Mood and Affect: Mood normal.     ED Results / Procedures / Treatments   Labs (all labs ordered are listed, but only abnormal results are displayed) Labs Reviewed  COMPREHENSIVE METABOLIC PANEL - Abnormal; Notable for the following components:      Result Value   Sodium 130 (*)    Potassium 5.5 (*)    CO2 19 (*)    AST 102 (*)    ALT 62 (*)    Total Bilirubin 1.3 (*)    All other components within normal limits  CBC WITH DIFFERENTIAL/PLATELET - Abnormal; Notable for the following components:   RDW 19.3 (*)    All other components within normal limits  LIPASE, BLOOD  CBC WITH DIFFERENTIAL/PLATELET  COMPREHENSIVE METABOLIC PANEL  MAGNESIUM  TSH  TROPONIN I (HIGH SENSITIVITY)  TROPONIN I (HIGH SENSITIVITY)    EKG EKG Interpretation  Date/Time:  Wednesday February 16 2022 22:54:54 EDT Ventricular Rate:  74 PR Interval:  108 QRS Duration: 88 QT Interval:  348 QTC Calculation: 386 R Axis:   63 Text Interpretation: Sinus rhythm with short PR with Premature atrial complexes Right atrial enlargement T wave abnormality, consider inferior ischemia T  wave abnormality, consider anterolateral ischemia Abnormal ECG When compared with ECG of 21-Jan-2022 18:37, PREVIOUS ECG IS PRESENT when compared to prior, now sinus from afib and less PVCs. No STEMI Confirmed by Antony Blackbird [phone]) on 02/17/2022 4:42:20 PM  Radiology DG Chest 2 View  Result Date: 02/17/2022 CLINICAL DATA:  Chest pain EXAM: CHEST - 2 VIEW COMPARISON:  01/20/2022 FINDINGS: Lungs are clear.  No pleural effusion or pneumothorax. The heart is normal in size. Visualized osseous structures are within normal limits. IMPRESSION: Normal chest radiographs. Electronically Signed   By: [name] M.D.   On: 02/17/2022 00:13   ECHOCARDIOGRAM LIMITED  Result Date: 02/16/2022    ECHOCARDIOGRAM LIMITED REPORT   Patient Name:   [name] Date of Exam: 02/16/2022 Medical Rec #:  [id number]              Height:       69.0 in Accession #:    [id number]             Weight:       123.2 lb Date of Birth:  Jan 08, 1971               BSA:          1.682 m Patient Age:    6 years               BP:           149/79 mmHg Patient Gender: M                      HR:           77 bpm. Exam Location:  [address] Procedure: Limited Echo, Cardiac Doppler and Color Doppler Indications:    I50.1 Left ventricular failure; I50.9* Heart failure                 (unspecified)  History:        Patient has prior history of Echocardiogram examinations, most                 recent 12/07/2021. Arrythmias:Atrial Fibrillation,  Signs/Symptoms:Chest Pain; Risk Factors:Hypertension and                 Dyslipidemia. WPW syndrome. NSVT. Asthma. Bronchitis. Sickle                 cell trait. Seizures. Acute kidney injury.  Sonographer:    Diamond Nickel RCS Referring Phys: Tami Lin [facility] IMPRESSIONS  1. Left ventricular ejection fraction, by estimation, is 65 to 70%. The left ventricle has normal function. The left ventricle has no regional wall motion abnormalities. There is mild left ventricular  hypertrophy. Left ventricular diastolic parameters were grossly normal. No significant intracavitary gradient.  2. Right ventricular systolic function is normal. The right ventricular size is normal.  3. The mitral valve is normal in structure. No evidence of mitral valve regurgitation. No evidence of mitral stenosis.  4. The aortic valve is grossly normal. Aortic valve regurgitation is not visualized. No aortic stenosis is present.  5. Rhythm strip during this exam demonstrates frequent ectopy/premature atrial contractions. FINDINGS  Left Ventricle: Left ventricular ejection fraction, by estimation, is 65 to 70%. The left ventricle has normal function. The left ventricle has no regional wall motion abnormalities. The left ventricular internal cavity size was normal in size. There is  mild left ventricular hypertrophy. Left ventricular diastolic parameters were normal. Right Ventricle: The right ventricular size is normal. No increase in right ventricular wall thickness. Right ventricular systolic function is normal. Left Atrium: Left atrial size was normal in size. Right Atrium: Right atrial size was normal in size. Pericardium: Trivial pericardial effusion is present. Mitral Valve: The mitral valve is normal in structure. No evidence of mitral valve stenosis. Tricuspid Valve: The tricuspid valve is normal in structure. Tricuspid valve regurgitation is not demonstrated. No evidence of tricuspid stenosis. Aortic Valve: AV morphology not evaluated on this exam. The aortic valve is grossly normal. Aortic valve regurgitation is not visualized. No aortic stenosis is present. Aorta: The aortic root is normal in size and structure. EKG: Rhythm strip during this exam demonstrates premature atrial contractions. LEFT VENTRICLE PLAX 2D LVIDd:         3.60 cm   Diastology LVIDs:         2.20 cm   LV e' medial:    7.76 cm/s LV PW:         1.20 cm   LV E/e' medial:  9.0 LV IVS:        1.00 cm   LV e' lateral:   12.20 cm/s LVOT  diam:     1.90 cm   LV E/e' lateral: 5.8 LV SV:         48 LV SV Index:   28 LVOT Area:     2.84 cm  RIGHT VENTRICLE RV Basal diam:  2.60 cm RV S prime:     13.00 cm/s LEFT ATRIUM             Index        RIGHT ATRIUM          Index LA diam:        2.70 cm 1.61 cm/m   RA Area:     7.60 cm LA Vol (A2C):   22.2 ml 13.20 ml/m  RA Volume:   13.30 ml 7.91 ml/m LA Vol (A4C):   15.1 ml 8.98 ml/m LA Biplane Vol: 18.6 ml 11.06 ml/m  AORTIC VALVE LVOT Vmax:   109.00 cm/s LVOT Vmean:  64.900 cm/s LVOT VTI:    0.168 m  AORTA Ao Root diam: 2.40 cm Ao Asc diam:  2.50 cm MITRAL VALVE MV Area (PHT): 3.64 cm    SHUNTS MV Decel Time: 209 msec    Systemic VTI:  0.17 m MV E velocity: 70.20 cm/s  Systemic Diam: 1.90 cm MV A velocity: 74.77 cm/s MV E/A ratio:  0.94 Cherlynn Kaiser MD Electronically signed by Cherlynn Kaiser MD Signature Date/Time: 02/16/2022/12:28:53 PM    Final     Procedures Procedures    Medications Ordered in ED Medications  sodium chloride 0.9 % bolus 1,000 mL (has no administration in time range)  ondansetron (ZOFRAN) injection 4 mg (has no administration in time range)  morphine (PF) 4 MG/ML injection 4 mg (has no administration in time range)  alum & mag hydroxide-simeth (MAALOX/MYLANTA) 200-200-20 MG/5ML suspension 30 mL (has no administration in time range)    And  lidocaine (XYLOCAINE) 2 % viscous mouth solution 15 mL (has no administration in time range)    ED Course/ Medical Decision Making/ A&P                           Medical Decision Making Amount and/or Complexity of Data Reviewed Labs: ordered. Radiology: ordered.  Risk OTC drugs. Prescription drug management.    [name] is a 51 y.o. male with a past medical history significant for WPW status post ablation, intermittent A-fib not on anticoagulation, polysubstance abuse history, chronic pancreatitis, hypertension, GERD, seizures, previous SVT, asthma, and depression who presents with recurrent chest  pain and abdominal pain.  Patient reports that he had a   recent admission where he was found to be in A-fib with RVR and then it appears patient was going to get evaluated by EP but ended up leaving AMA.  Patient then subsequently saw his cardiologist with Dr. Percival Spanish 3 days ago and had a limited echo done yesterday.  She reports that he started having recurrent chest pain that is moderate to severe in his central chest that is pressure and sharp and goes into his left arm and has been feeling fatigued, lightheaded, nausea, vomiting, some abdominal pain, and diarrhea.  He reports he thinks he is dehydrated and is concerned about his chest pain.  Otherwise he denies any fevers, chills, congestion, cough, constipation, urinary changes, or rashes.  Denies trauma.  Of note, patient has been in the waiting room approximately 18 hours and 45 minutes prior to my initial evaluation.  Initial EKG in triage shows evidence of sinus rhythm where he was in A-fib last time.  On my exam, lungs were clear and chest was nontender.  Abdomen was tender in the epigastric area primarily.  Bowel sounds were appreciated.  Flanks and back nontender.  Good pulses in extremities.  Patient still complaining of pain and nausea.  Patient had labs 18 hours ago and was found to be hyponatremic, hyperkalemic, elevated LFTs and bilirubin from baseline  and his CBC was reassuring.  Lipase was normal and troponin was normal x2.  As patient recently had a CT abdomen pelvis, will hold on repeat CT but will get a" ultrasound to rule out acute cholecystitis or other gallbladder trouble.  We will give some pain, nausea meds, and fluids as mucous brains are quite dry to help treat a degree of suspected dehydration.  We will get repeat labs as has been so long since his labs were done in the waiting room.   Of note, review of the patient's vital signs  from the waiting room show that he has had intermittent bradycardia with rates as low as 31  documented.  I am somewhat concerned about the patient's near syncopal and lightheaded episodes in the setting of chest pain with his WPW history and A-fib history and these bradycardia spells.  We will get him on the monitor and get a repeat EKG.  We will also add on TSH and magnesium level.  Anticipate reassessment after work-up is completed to determine disposition.  Patient remained in sinus rhythm on the monitor for many hours.  No evidence of recurrent A-fib or a flutter.  No significant or persistent bradycardia seen during my time with the patient.  Patient's repeat labs were somewhat improving.  LFTs were improving.  Magnesium was just barely low.  CBC reassuring.  Troponin was negative x2.  Ultrasound unremarkable and chest x-ray did not show acute abnormality.  Patient was feeling better after medications.  He also better after fluids.  Patient felt better after Maalox.  Clinically I suspect he could have a mild smoldering pancreatitis without LFT elevation as he has had this before.  Given his otherwise reassuring work-up and stability for nearly 24 hours in the emergency department we do feel he is safe for discharge home.  Patient will follow-up with his PCP and understood return precautions.  Patient discharged in good condition for outpatient follow-up.         Final Clinical Impression(s) / ED Diagnoses Final diagnoses:  Atypical chest pain  Nausea vomiting and diarrhea  Epigastric pain    Rx / DC Orders ED Discharge Orders          Ordered    loperamide (IMODIUM) 2 MG capsule  4 times daily PRN,   Status:  Discontinued        02/17/22 2151    oxyCODONE (ROXICODONE) 5 MG immediate release tablet  Every 4 hours PRN,   Status:  Discontinued        02/17/22 2151    ondansetron (ZOFRAN) 4 MG tablet  Every 6 hours,   Status:  Discontinued        02/17/22 2151    loperamide (IMODIUM) 2 MG capsule  4 times daily PRN        02/17/22 2152    ondansetron (ZOFRAN) 4 MG tablet   Every 6 hours        02/17/22 2152    oxyCODONE (ROXICODONE) 5 MG immediate release tablet  Every 4 hours PRN        02/17/22 2152            Clinical Impression: 1. Atypical chest pain   2. Nausea vomiting and diarrhea   3. Epigastric pain     Disposition: Discharge  Condition: Good  I have discussed the results, Dx and Tx plan with the pt(& family if present). He/she/they expressed understanding and agree(s) with the plan. Discharge instructions discussed at great length. Strict return precautions discussed and pt &/or family have verbalized understanding of the instructions. No further questions at time of discharge.    Current Discharge Medication List     START taking these medications   Details  loperamide (IMODIUM) 2 MG capsule Take 1 capsule (2 mg total) by mouth 4 (four) times daily as needed for diarrhea or loose stools. Qty: 12 capsule, Refills: 0    !! ondansetron (ZOFRAN) 4 MG tablet Take 1 tablet (4 mg total) by mouth every 6 (six) hours. Qty: 12 tablet, Refills: 0  oxyCODONE (ROXICODONE) 5 MG immediate release tablet Take 1 tablet (5 mg total) by mouth every 4 (four) hours as needed for severe pain. Qty: 8 tablet, Refills: 0     !! - Potential duplicate medications found. Please discuss with provider.      Follow Up: [place] [id number] [phone] Schedule an appointment as soon as possible for a visit    [place] [address] [phone] mc [place]          Dovber Ernest, Gwenyth Allegra, MD 02/17/22 2157

## 2022-02-17 NOTE — Telephone Encounter (Signed)
Returned call to patient he stated he is presently at [facility] ED he has been there since 9:30 pm last night.Stated he has not seen a Dr yet.Stated his heart rate 112 B/P 158/103.Advised he will speak to RN taking care of him.

## 2022-02-17 NOTE — ED Notes (Signed)
All discharge instructions reviewed with patient including follow up care and prescriptions. Patient verbalized understanding of same and had no other questions. Patient stable and ambulatory at time of discharge.  

## 2022-02-17 NOTE — Discharge Instructions (Signed)
Your history, exam, and work-up today were overall reassuring.  You are here for nearly 24 hours in the emergency department and we trended your liver function where it started to improve.  I suspect the nausea vomiting diarrhea caused some dehydration and some mild electrolyte abnormalities as we discussed.  You may have some mild pancreatitis with out an elevated lipase as we discussed.  Please rest and stay hydrated and use the pain Medicine, nausea medicine, and Imodium to help with your symptoms.  The ultrasound was reassuring and we had a shared decision-making conversation to hold on CT today.  Please follow-up with your primary doctor.  If any symptoms change or worsen acutely, please return to the nearest emergency department.

## 2022-02-18 NOTE — Telephone Encounter (Signed)
Patient has been seen in the ER 

## 2022-02-22 ENCOUNTER — Encounter: Payer: Self-pay | Admitting: *Deleted

## 2022-03-05 ENCOUNTER — Ambulatory Visit (HOSPITAL_COMMUNITY)
Admission: EM | Admit: 2022-03-05 | Discharge: 2022-03-05 | Disposition: A | Discharge status: Home / Self Care | Payer: Medicaid Other | Attending: Physician Assistant | Admitting: Physician Assistant

## 2022-03-05 DIAGNOSIS — R110 Nausea: Secondary | ICD-10-CM | POA: Insufficient documentation

## 2022-03-05 DIAGNOSIS — R071 Chest pain on breathing: Secondary | ICD-10-CM | POA: Insufficient documentation

## 2022-03-05 DIAGNOSIS — R051 Acute cough: Secondary | ICD-10-CM | POA: Insufficient documentation

## 2022-03-05 DIAGNOSIS — Z20822 Contact with and (suspected) exposure to covid-19: Secondary | ICD-10-CM | POA: Diagnosis not present

## 2022-03-05 DIAGNOSIS — Z1152 Encounter for screening for COVID-19: Secondary | ICD-10-CM | POA: Diagnosis present

## 2022-03-05 MED ORDER — DM-GUAIFENESIN ER 30-600 MG PO TB12: 1.0000 | ORAL_TABLET | Freq: Two times a day (BID) | ORAL | 0 refills | Status: DC

## 2022-03-05 MED ORDER — ACETAMINOPHEN ER 650 MG PO TBCR: 650.0000 mg | EXTENDED_RELEASE_TABLET | Freq: Three times a day (TID) | ORAL | 0 refills | Status: DC | PRN

## 2022-03-05 MED ORDER — ONDANSETRON HCL 4 MG PO TABS: 4.0000 mg | ORAL_TABLET | Freq: Three times a day (TID) | ORAL | 0 refills | Status: DC | PRN

## 2022-03-05 NOTE — ED Provider Notes (Signed)
[facility]    CSN: [id number] Arrival date & time: 03/05/22  1559      History   Chief Complaint Chief Complaint  Patient presents with   Chest Pain   Shortness of Breath    HPI [name] is a 51 y.o. male.   51 year old male presents with chest pain, nausea, cough, diarrhea.  Patient indicates that he was exposed to someone that had COVID at a party 2 days ago.  Patient indicates he has been having some cough, chest congestion, production which has been clear.  Patient also indicates he has been having some upper respiratory symptoms with mild rhinitis and postnasal drip which is also been clear.  Patient indicates he has not been having any fever or chills.  He does indicate he did a COVID test earlier today which was positive. Patient indicates that he has been having some chest pain along with shortness of breath when he coughs.  Patient does have a history of having COPD.  But he relates he has not been having any wheezing.  He has been having chest congestion with cough over the past couple days.  He relates he has history of having pancreatitis and has been having some abdominal pain over the past 2 days with pain being in the epigastric and left upper quadrant of the abdomen.  He indicates he has been having some nausea without vomiting.  He also indicates that he has been having some intermittent diarrhea with loose watery stools.  Patient also indicates that he has a history of having indigestion and heartburn.  Patient is tolerating fluids well.  Patient does have a cardiac history of having WPW and A-fib. Patient indicates he is under treatment from a pain center and he is requesting some pain medication today but he has been advised that we cannot prescribe that since he has been treated at the pain center.   Chest Pain Associated symptoms: nausea and shortness of breath   Shortness of Breath Associated symptoms: chest pain     Past Medical  History:  Diagnosis Date   Alcoholism /alcohol abuse    Anemia    Anxiety    Arthritis    "knees; arms; elbows" (03/26/2015)   Asthma    Bipolar disorder (HCC)    Chronic bronchitis (HCC)    Chronic lower back pain    Chronic pancreatitis ([place])    Cocaine abuse ([place])    Depression    Family history of adverse reaction to anesthesia    "grandmother gets confused"   Femoral condyle fracture ([place]) 03/08/2014   left medial/notes 03/09/2014   GERD (gastroesophageal reflux disease)    H/O hiatal hernia    H/O suicide attempt 10/2012   High cholesterol    History of blood transfusion 10/2012   "when I tried to commit suicide"   History of stomach ulcers    Hypertension    Marijuana abuse, continuous    Migraine    "a few times/year" (03/26/2015)   Pneumonia 1990's X 3   PTSD (post-traumatic stress disorder)    Sickle cell trait ([place])    WPW (Wolff-Parkinson-White syndrome)    [name] 03/06/2013    Patient Active Problem List   Diagnosis Date Noted   NSVT (nonsustained ventricular tachycardia) ([place]) 01/20/2022   Normal anion gap metabolic acidosis 59/16/3846   Hypoalbuminemia 12/08/2021   Acute recurrent pancreatitis 12/07/2021   WPW (Wolff-Parkinson-White syndrome) 12/06/2021   Paroxysmal atrial fibrillation with RVR ([place]) 12/06/2021  Pulmonary nodule 12/06/2021   Leukocytosis 06/07/2021   Epigastric pain 42/70/6237   Acute alcoholic pancreatitis 62/83/1517   Urinary retention    Protein-calorie malnutrition, severe 11/17/2020   Acute pancreatitis 11/15/2020   Hypomagnesemia    Hypocalcemia 10/23/2020   AKI (acute kidney injury) ([place]) 11/13/2018   Seizure ([place]) 11/13/2018   Chest pain 01/08/2018   Acute on chronic pancreatitis ([place]) 09/28/2017   Abdominal pain 05/27/2017   Nausea & vomiting 03/18/2017   Diarrhea 03/18/2017   Gastroesophageal reflux disease    Normocytic anemia 12/05/2016   Alcohol use disorder, severe, dependence ([place]) 07/25/2016   Cocaine use  disorder, severe, dependence ([place]) 07/25/2016   Major depressive disorder, 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) 05/18/2015   Essential hypertension 02/06/2014   Mood disorder ([place]) 02/06/2014   Pancreatic pseudocyst/cyst 11/25/2013   [name] White pattern seen on electrocardiogram 10/03/2012   Tobacco abuse 03/23/2007    Past Surgical History:  Procedure Laterality Date   BIOPSY  11/25/2017   Procedure: BIOPSY;  Surgeon: Arta Silence, MD;  Location: [place] [facility];  Service: Endoscopy;;   BIOPSY  10/14/2018   Procedure: BIOPSY;  Surgeon: Arta Silence, MD;  Location: [facility];  Service: Endoscopy;;   CARDIAC CATHETERIZATION     CYST ENTEROSTOMY  01/02/2020   Procedure: CYST ASPIRATION;  Surgeon: Milus Banister, MD;  Location: [facility];  Service: Endoscopy;;   ESOPHAGOGASTRODUODENOSCOPY (EGD) WITH PROPOFOL N/A 11/25/2017   Procedure: ESOPHAGOGASTRODUODENOSCOPY (EGD) WITH PROPOFOL;  Surgeon: Arta Silence, MD;  Location: [facility] END[facility]COPY;  Service: Endoscopy;  Laterality: N/A;   ESOPHAGOGASTRODUODENOSCOPY (EGD) WITH PROPOFOL Left 10/14/2018   Procedure: ESOPHAGOGASTRODUODENOSCOPY (EGD) WITH PROPOFOL;  Surgeon: Arta Silence, MD;  Location[facility] ENDOSCOPY;  Service: Endoscopy;  Laterality: Left;   ESOPHAGOGASTRODUODENOSCOPY (EGD) WITH PROPOFOL N/A 11/14/2018   Procedure: ESOPHAGOGASTRODUODENOSCOPY (EGD) WITH PROPOFOL;  Surgeon: Laurence Spates, MD;  Location: [facility];  Service: Gastroenterology;  Laterality: N/A;   ESOPHAGOGASTRODUODENOSCOPY (EGD) WITH PROPOFOL N/A 01/02/2020   Procedure: ESOPHAGOGASTRODUODENOSCOPY (EGD) WITH PROPOFOL;  Surgeon: Milus Banister, MD;  Location: [facility];  Service: Endoscopy;  Laterality: N/A;   ESOPHAGOGASTRODUODENOSCOPY (EGD) WITH PROPOFOL N/A 10/25/2020   Procedure: ESOPHAGOGASTRODUODENOSCOPY (EGD) WITH PROPOFOL;  Surgeon: Rush Landmark Telford Nab., MD;  Location: [place];  Service:  Gastroenterology;  Laterality: N/A;   EUS N/A 01/02/2020   Procedure: UPPER ENDOSCOPIC ULTRASOUND (EUS) RADIAL;  Surgeon: Milus Banister, MD;  Location: [facility];  Service: Endoscopy;  Laterality: N/A;   EYE SURGERY Left 1990's   "result of trauma"    FACIAL FRACTURE SURGERY Left 1990's   "result of trauma"    FLEXIBLE SIGMOIDOSCOPY N/A 10/25/2020   Procedure: FLEXIBLE SIGMOIDOSCOPY;  Surgeon: Irving Copas., MD;  Location: [place];  Service: Gastroenterology;  Laterality: N/A;   FRACTURE SURGERY     HEMOSTASIS CLIP PLACEMENT  10/25/2020   Procedure: HEMOSTASIS CLIP PLACEMENT;  Surgeon: Irving Copas., MD;  Location: [facility];  Service: Gastroenterology;;   HERNIA REPAIR     LEFT HEART CATHETERIZATION WITH CORONARY ANGIOGRAM Right 03/07/2013   Procedure: LEFT HEART CATHETERIZATION WITH CORONARY ANGIOGRAM;  Surgeon: [name], MD;  Location: [facility] C[facility];  Service: Cardiovascular;  Laterality: Right;   UMBILICAL HERNIA REPAIR     UPPER GASTROINTESTINAL ENDOSCOPY         Home Medications    Prior to Admission medications   Medication Sig Start Date End Date Taking? Authorizing Provider  acetaminophen (TYLENOL) 650 MG CR tablet Take  1 tablet (650 mg total) by mouth every 8 (eight) hours as needed for pain. 03/05/22  Yes Nyoka Lint, PA-C  dextromethorphan-guaiFENesin [facility] DM) 30-600 MG 12hr tablet Take 1 tablet by mouth 2 (two) times daily. 03/05/22  Yes Nyoka Lint, PA-C  ondansetron (ZOFRAN) 4 MG tablet Take 1 tablet (4 mg total) by mouth every 8 (eight) hours as needed for nausea or vomiting. 03/05/22  Yes Nyoka Lint, PA-C  acetaminophen (TYLENOL) 500 MG tablet Take 1,000 mg by mouth every 6 (six) hours as needed for moderate pain or headache.    [provider]  albuterol (VENTOLIN HFA) 108 (90 Base) MCG/ACT inhaler Inhale 2 puffs into the lungs every 4 (four) hours as needed for wheezing or shortness of breath. 08/17/21   [name], MD  amLODipine (NORVASC) 10 MG tablet Take 1 tablet (10 mg total) by mouth daily. 12/25/21   [name], MD  folic acid (FOLVITE) 1 MG tablet Take 1 tablet (1 mg total) by mouth daily. 11/26/17   [name], MD  HYDROcodone-acetaminophen (NORCO/VICODIN) 5-325 MG tablet Take 1 tablet by mouth every 12 (twelve) hours as needed for moderate pain. 12/24/21   [name], MD  lidocaine (LMX) 4 % cream Apply topically 4 (four) times daily as needed (NGT pain). 12/24/21   [name], MD  lipase/protease/amylase (CREON) 36000 UNITS CPEP capsule Take 2 capsules (72,000 Units total) by mouth 3 (three) times daily with meals. 12/24/21   [name], MD  loperamide (IMODIUM) 2 MG capsule Take 1 capsule (2 mg total) by mouth 4 (four) times daily as needed for diarrhea or loose stools. 02/17/22   Tegeler, Gwenyth Allegra, MD  magnesium oxide (MAG-OX) 400 (240 Mg) MG tablet Take 1 tablet (400 mg total) by mouth daily. 11/30/21   Davonna Belling, MD  metoprolol succinate (TOPROL XL) 25 MG 24 hr tablet Take 1 tablet (25 mg total) by mouth as needed (for palpitations). To not exceed 1 dose per day. Patient taking differently: Take 50 mg by mouth daily. 01/06/22   [facility], Tami Lin, PA  metoprolol succinate (TOPROL-XL) 200 MG 24 hr tablet Take 1 tablet (200 mg total) by mouth daily. Take with or immediately following a meal. 02/14/22   Minus Breeding, MD  Multiple Vitamin (MULTIVITAMIN WITH MINERALS) TABS tablet Take 1 tablet by mouth daily. 09/06/17   Bonnell Public, MD  nicotine (NICODERM CQ - DOSED IN MG/24 HOURS) 21 mg/24hr patch Place 1 patch (21 mg total) onto the skin at bedtime. 12/24/21   [name], MD  nitroGLYCERIN (NITROSTAT) 0.4 MG SL tablet Place 1 tablet (0.4 mg total) under the tongue every 5 (five) minutes as needed for chest pain. Do not exceed 3 doses in a row. 01/06/22   [facility], Tami Lin, PA  ondansetron (ZOFRAN) 4 MG tablet Take 1 tablet (4 mg  total) by mouth every 6 (six) hours. 11/03/21   Sherrell Puller, PA-C  ondansetron (ZOFRAN) 4 MG tablet Take 1 tablet (4 mg total) by mouth every 6 (six) hours. 02/17/22   Tegeler, Gwenyth Allegra, MD  oxyCODONE (ROXICODONE) 5 MG immediate release tablet Take 1 tablet (5 mg total) by mouth every 4 (four) hours as needed for severe pain. 02/17/22   Tegeler, Gwenyth Allegra, MD  oxyCODONE-acetaminophen (PERCOCET/ROXICET) 5-325 MG tablet Take 1 tablet by mouth every 6 (six) hours as needed for severe pain. 01/21/22   Drenda Freeze, MD  pantoprazole (PROTONIX) 40 MG tablet Take 1  tablet (40 mg total) by mouth daily. 01/21/22   Drenda Freeze, MD  Polyethyl Glycol-Propyl Glycol (SYSTANE OP) Place 1 drop into both eyes 4 (four) times daily as needed (for dryness).    [provider]  sucralfate (CARAFATE) 1 g tablet Take 1 tablet (1 g total) by mouth 4 (four) times daily -  with meals and at bedtime. 01/21/22   Drenda Freeze, MD  thiamine 100 MG tablet Take 1 tablet (100 mg total) by mouth daily. 10/16/18   [name], MD  Vitamin D, Ergocalciferol, (DRISDOL) 1.25 MG (50000 UNIT) CAPS capsule Take 50,000 Units by mouth every Wednesday.    [provider]  amitriptyline (ELAVIL) 25 MG tablet Take 1 tablet (25 mg total) by mouth at bedtime. Patient not taking: Reported on 08/08/2019 10/15/18 08/08/19  [name], MD    Family History Family History  Problem Relation Age of Onset   Hypertension Mother    Cirrhosis Father    Alcoholism Father    Hypertension Father    Melanoma Father    Hypertension Other    Coronary artery disease Other     Social History Social History   Tobacco Use   Smoking status: Every Day    Packs/day: 1.00    Years: 36.00    Total pack years: 36.00    Types: Cigarettes, E-cigarettes   Smokeless tobacco: Never  Vaping Use   Vaping Use: Former  Substance Use Topics   Alcohol use: Not Currently    Comment: last drink a few days ago   Drug  use: Not Currently    Types: Marijuana, Cocaine     Allergies   Robaxin [methocarbamol], Aspirin, Shellfish-derived products, Trazodone and nefazodone, Adhesive [tape], Latex, Toradol [ketorolac tromethamine], Contrast media [iodinated contrast media], Reglan [metoclopramide], and Salmon [fish oil]   Review of Systems Review of Systems  Respiratory:  Positive for shortness of breath.   Cardiovascular:  Positive for chest pain.  Gastrointestinal:  Positive for diarrhea and nausea.     Physical Exam Triage Vital Signs ED Triage Vitals [03/05/22 1715]  Enc Vitals Group     BP 101/68     Pulse Rate 72     Resp 18     Temp 99.1 F (37.3 C)     Temp Source Oral     SpO2 98 %     Weight      Height      Head Circumference      Peak Flow      Pain Score      Pain Loc      Pain Edu?      Excl. in [place]?    No data found.  Updated Vital Signs BP 101/68 (BP Location: Left Arm)   Pulse 72   Temp 99.1 F (37.3 C) (Oral)   Resp 18   SpO2 98%   Visual Acuity Right Eye Distance:   Left Eye Distance:   Bilateral Distance:    Right Eye Near:   Left Eye Near:    Bilateral Near:     Physical Exam   UC Treatments / Results  Labs (all labs ordered are listed, but only abnormal results are displayed) Labs Reviewed  SARS CORONAVIRUS 2 (TAT 6-24 HRS)    EKG: Sinus rhythm with short PR, PACs, no acute changes today.   Radiology No results found.  Procedures Procedures (including critical care time)  Medications Ordered in UC Medications - No data to display  Initial Impression / Assessment and Plan / UC Course  I have reviewed the triage vital signs and the nursing notes.  Pertinent labs & imaging results that were available during my care of the patient were reviewed by me and considered in my medical decision making (see chart for details).    Plan: 1.  The acute cough will be treated with the following: A.  Mucinex DM every 12 hours to help control cough  and congestion. 2.  COVID-19 will be treated with the following: A.  Treatment will be adjusted depending on the results of the COVID test. 3.  Nausea will be treated with the following: A.  Zofran 4 mg every 6 hours to control nausea and slow down the diarrhea for the next couple days. 4.  Chest pain will be treated with the following: A.  EKG is noted without acute changes today. B.  Patient has been advised to report to the emergency room if symptoms fail to improve or worsen over the next 48 hours. 5.  Patient advised to follow-up PCP and return to urgent care as needed. Final Clinical Impressions(s) / UC Diagnoses   Final diagnoses:  Chest pain on breathing  Acute cough  Exposure to COVID-19 virus  Encounter for screening for COVID-19  Nausea without vomiting     Discharge Instructions      Advised to report to the emergency room if symptoms fail to improve over the next 48 to 72 hours. Advised to take the Zofran every 6 hours as needed for nausea and stomach discomfort. Advised take Mucinex DM every 12 hours to control cough and chest congestion. COVID test will be completed in 48 hours.  If you do not hear that from this office that indicates the test is negative.  Log onto MyChart to view the test results when it post in 48 hours.    ED Prescriptions     Medication Sig Dispense Auth. Provider   dextromethorphan-guaiFENesin (MUCINEX DM) 30-600 MG 12hr tablet Take 1 tablet by mouth 2 (two) times daily. 20 tablet Nyoka Lint, PA-C   ondansetron (ZOFRAN) 4 MG tablet Take 1 tablet (4 mg total) by mouth every 8 (eight) hours as needed for nausea or vomiting. 20 tablet Nyoka Lint, PA-C   acetaminophen (TYLENOL) 650 MG CR tablet Take 1 tablet (650 mg total) by mouth every 8 (eight) hours as needed for pain. 10 tablet Nyoka Lint, PA-C      PDMP not reviewed this encounter.   Nyoka Lint, PA-C 03/05/22 1757

## 2022-03-05 NOTE — ED Triage Notes (Addendum)
Pt reports taking Viagra this afternoon.  Pt took one dose of Nitroglycerin today.

## 2022-03-05 NOTE — Discharge Instructions (Addendum)
Advised to report to the emergency room if symptoms fail to improve over the next 48 to 72 hours. Advised to take the Zofran every 6 hours as needed for nausea and stomach discomfort. Advised take Mucinex DM every 12 hours to control cough and chest congestion. COVID test will be completed in 48 hours.  If you do not hear that from this office that indicates the test is negative.  Log onto MyChart to view the test results when it post in 48 hours.

## 2022-03-05 NOTE — ED Triage Notes (Signed)
Pt presents to the urgent care today for chest pain and SOB. Pt reports he was expose to covid yesterday.

## 2022-03-06 LAB — SARS CORONAVIRUS 2 (TAT 6-24 HRS): SARS Coronavirus 2: POSITIVE — AB

## 2022-03-09 ENCOUNTER — Telehealth (HOSPITAL_COMMUNITY): Payer: Self-pay | Admitting: Emergency Medicine

## 2022-03-09 NOTE — Telephone Encounter (Signed)
Received message that patient's mother called and requested Mucinex be sent as a prescription to the pharmacy.  Reviewed, and it appears it was.  Called pharmacy, they state it is not covered by patient's insurance due to being OTC.   Attempted to call patient to update him, LVM

## 2022-03-11 IMAGING — DX DG CHEST 1V PORT
1 series · 1 of 1 positions shown · non-contrast
Comparison: PA and lateral chest 08/28/2019 and 12/25/2018.

CLINICAL DATA: Chest pain.

EXAM:
PORTABLE CHEST 1 VIEW

[chest ap]
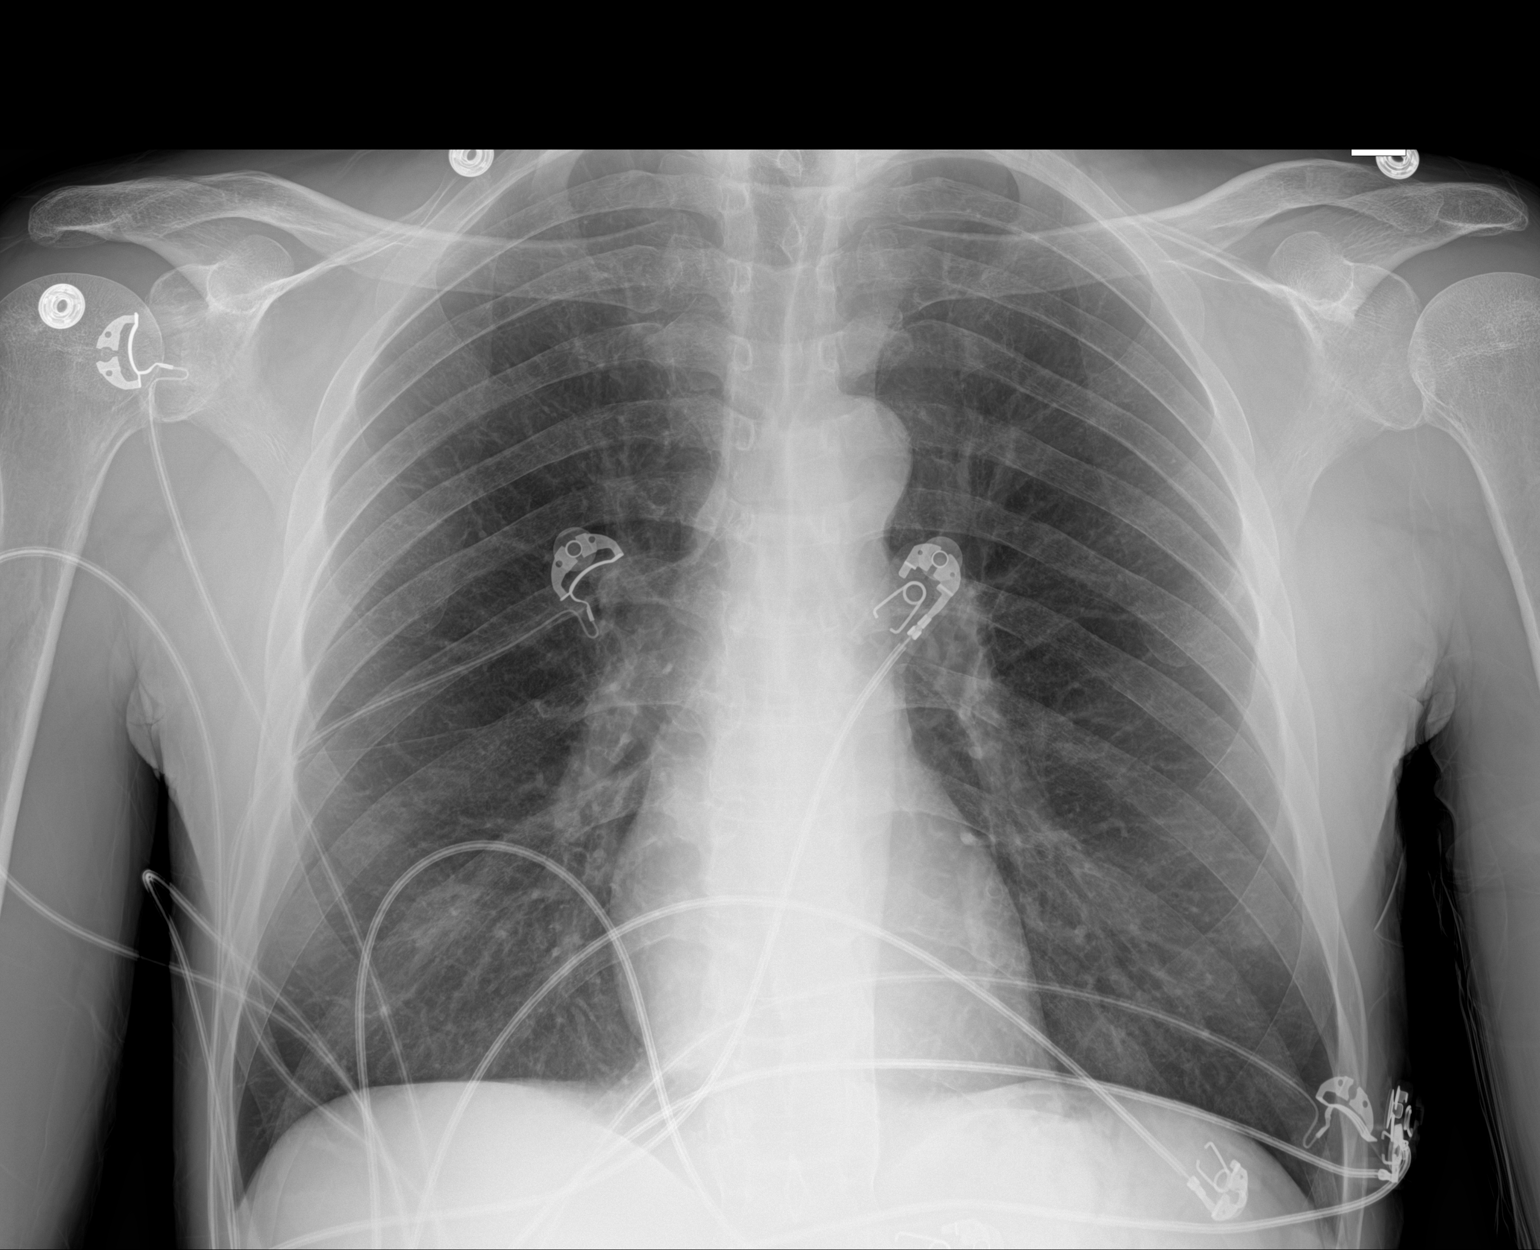

[1 of 1 positions shown; findings below may reference images not displayed]

FINDINGS: The lungs are clear. Heart size is normal. No pneumothorax or
pleural fluid. No acute or focal bony abnormality.
IMPRESSION: Negative chest.

## 2022-03-14 ENCOUNTER — Emergency Department (HOSPITAL_COMMUNITY)
Admission: EM | Admit: 2022-03-14 | Discharge: 2022-03-14 | Disposition: A | Discharge status: Home / Self Care | Payer: Medicaid Other | Attending: Emergency Medicine | Admitting: Emergency Medicine

## 2022-03-14 ENCOUNTER — Other Ambulatory Visit: Payer: Self-pay

## 2022-03-14 ENCOUNTER — Emergency Department (HOSPITAL_COMMUNITY): Payer: Medicaid Other

## 2022-03-14 DIAGNOSIS — Z79899 Other long term (current) drug therapy: Secondary | ICD-10-CM | POA: Insufficient documentation

## 2022-03-14 DIAGNOSIS — I10 Essential (primary) hypertension: Secondary | ICD-10-CM | POA: Diagnosis not present

## 2022-03-14 DIAGNOSIS — R109 Unspecified abdominal pain: Secondary | ICD-10-CM | POA: Diagnosis present

## 2022-03-14 DIAGNOSIS — R1013 Epigastric pain: Secondary | ICD-10-CM | POA: Diagnosis not present

## 2022-03-14 DIAGNOSIS — J45909 Unspecified asthma, uncomplicated: Secondary | ICD-10-CM | POA: Diagnosis not present

## 2022-03-14 LAB — LIPASE, BLOOD: Lipase: 31 U/L (ref 11–51)

## 2022-03-14 LAB — CBC WITH DIFFERENTIAL/PLATELET
Abs Immature Granulocytes: 0.03 10*3/uL (ref 0.00–0.07)
Basophils Absolute: 0 10*3/uL (ref 0.0–0.1)
Basophils Relative: 0 %
Eosinophils Absolute: 0.2 10*3/uL (ref 0.0–0.5)
Eosinophils Relative: 3 %
HCT: 42.7 % (ref 39.0–52.0)
Hemoglobin: 13.2 g/dL (ref 13.0–17.0)
Immature Granulocytes: 1 %
Lymphocytes Relative: 33 %
Lymphs Abs: 2.1 10*3/uL (ref 0.7–4.0)
MCH: 30.2 pg (ref 26.0–34.0)
MCHC: 30.9 g/dL (ref 30.0–36.0)
MCV: 97.7 fL (ref 80.0–100.0)
Monocytes Absolute: 0.8 10*3/uL (ref 0.1–1.0)
Monocytes Relative: 12 %
Neutro Abs: 3.3 10*3/uL (ref 1.7–7.7)
Neutrophils Relative %: 51 %
Platelets: 234 10*3/uL (ref 150–400)
RBC: 4.37 MIL/uL (ref 4.22–5.81)
RDW: 17.7 % — ABNORMAL HIGH (ref 11.5–15.5)
WBC: 6.5 10*3/uL (ref 4.0–10.5)
nRBC: 0 % (ref 0.0–0.2)

## 2022-03-14 LAB — TROPONIN I (HIGH SENSITIVITY)
Troponin I (High Sensitivity): 10 ng/L (ref <18)
Troponin I (High Sensitivity): 10 ng/L (ref <18)

## 2022-03-14 LAB — COMPREHENSIVE METABOLIC PANEL
ALT: 54 U/L — ABNORMAL HIGH (ref 0–44)
AST: 41 U/L (ref 15–41)
Albumin: 3.8 g/dL (ref 3.5–5.0)
Alkaline Phosphatase: 93 U/L (ref 38–126)
Anion gap: 13 (ref 5–15)
BUN: 8 mg/dL (ref 6–20)
CO2: 13 mmol/L — ABNORMAL LOW (ref 22–32)
Calcium: 9.3 mg/dL (ref 8.9–10.3)
Chloride: 111 mmol/L (ref 98–111)
Creatinine, Ser: 0.71 mg/dL (ref 0.61–1.24)
GFR, Estimated: >60 mL/min (ref >60)
Glucose, Bld: 74 mg/dL (ref 70–99)
Potassium: 4.4 mmol/L (ref 3.5–5.1)
Sodium: 137 mmol/L (ref 135–145)
Total Bilirubin: 0.3 mg/dL (ref 0.3–1.2)
Total Protein: 7.1 g/dL (ref 6.5–8.1)

## 2022-03-14 LAB — MAGNESIUM: Magnesium: 0.5 mg/dL — CL (ref 1.7–2.4)

## 2022-03-14 MED ORDER — ALUM & MAG HYDROXIDE-SIMETH 200-200-20 MG/5ML PO SUSP
30.0000 mL | Freq: Once | ORAL | Status: AC
  Administered 2022-03-14: 30 mL via ORAL
  Filled 2022-03-14: qty 30

## 2022-03-14 MED ORDER — ONDANSETRON HCL 4 MG/2ML IJ SOLN
4.0000 mg | Freq: Once | INTRAMUSCULAR | Status: AC
  Administered 2022-03-14: 4 mg via INTRAVENOUS
  Filled 2022-03-14: qty 2

## 2022-03-14 MED ORDER — SODIUM CHLORIDE 0.9 % IV BOLUS
1000.0000 mL | Freq: Once | INTRAVENOUS | Status: AC
  Administered 2022-03-14: 1000 mL via INTRAVENOUS

## 2022-03-14 MED ORDER — MAGNESIUM SULFATE 2 GM/50ML IV SOLN
2.0000 g | Freq: Once | INTRAVENOUS | Status: AC
  Administered 2022-03-14: 2 g via INTRAVENOUS
  Filled 2022-03-14: qty 50

## 2022-03-14 MED ORDER — OXYCODONE-ACETAMINOPHEN 5-325 MG PO TABS: 1.0000 | ORAL_TABLET | Freq: Four times a day (QID) | ORAL | 0 refills | Status: DC | PRN

## 2022-03-14 MED ORDER — OXYCODONE-ACETAMINOPHEN 5-325 MG PO TABS
1.0000 | ORAL_TABLET | Freq: Once | ORAL | Status: AC
  Administered 2022-03-14: 1 via ORAL
  Filled 2022-03-14: qty 1

## 2022-03-14 MED ORDER — PANTOPRAZOLE SODIUM 40 MG PO TBEC: 40.0000 mg | DELAYED_RELEASE_TABLET | Freq: Every day | ORAL | 0 refills | Status: DC

## 2022-03-14 MED ORDER — MORPHINE SULFATE (PF) 4 MG/ML IV SOLN
4.0000 mg | Freq: Once | INTRAVENOUS | Status: AC
  Administered 2022-03-14: 4 mg via INTRAVENOUS
  Filled 2022-03-14: qty 1

## 2022-03-14 MED ORDER — BENZONATATE 100 MG PO CAPS: 100.0000 mg | ORAL_CAPSULE | Freq: Three times a day (TID) | ORAL | 0 refills | Status: DC | PRN

## 2022-03-14 NOTE — Discharge Instructions (Signed)
Follow close with your primary care physician.  Return with any worsening worsening symptoms.

## 2022-03-14 NOTE — ED Provider Notes (Signed)
Emergency Department Provider Note   I have reviewed the triage vital signs and the nursing notes.   HISTORY  Chief Complaint Chest Pain   HPI [name] is a 51 y.o. male past medical history reviewed including polysubstance abuse, chronic pancreatitis, asthma, alcohol abuse, and WPW on Metoprolol presents to the emergency department for evaluation of abdominal pain radiating to the chest with nausea and vomiting.  Patient tells me symptoms been developing over the past couple of days.  He tells me has been cutting back on his alcohol now drinking 3-4 beers daily.  Denies liquor drinking.  No cocaine for the past 10 days.  Denies other drug use.  He feels a sharp pain in his abdomen radiating to the chest.  He was trying to manage symptoms at home but ultimately required ED evaluation for treatment.  No vomiting blood.  No diarrhea.  Abdominal pain is mainly epigastric.  Past Medical History:  Diagnosis Date   Alcoholism /alcohol abuse    Anemia    Anxiety    Arthritis    knees; arms; elbows (03/26/2015)   Asthma    Bipolar disorder (HCC)    Chronic bronchitis (HCC)    Chronic lower back pain    Chronic pancreatitis ([place])    Cocaine abuse ([place])    Depression    Family history of adverse reaction to anesthesia    Femoral condyle fracture ([place]) 03/08/2014   left medial/notes 03/09/2014   GERD (gastroesophageal reflux disease)    H/O hiatal hernia    H/O suicide attempt 10/2012   High cholesterol    History of blood transfusion 10/2012   when I tried to commit suicide   History of stomach ulcers    Hypertension    Marijuana abuse, continuous    Migraine    a few times/year (03/26/2015)   Pneumonia 1990's X 3   PTSD (post-traumatic stress disorder)    Sickle cell trait (HCC)    WPW (Wolff-Parkinson-White syndrome)    [name] 03/06/2013    Review of Systems  Constitutional: No fever/chills Cardiovascular: Denies chest pain. Respiratory: Denies shortness  of breath. Gastrointestinal: Positive abdominal pain. Positive nausea and vomiting.  No diarrhea.  No constipation. Genitourinary: Negative for dysuria. Musculoskeletal: Negative for back pain. Skin: Negative for rash. Neurological: Negative for headaches.    ____________________________________________   PHYSICAL EXAM:  VITAL SIGNS: ED Triage Vitals  Enc Vitals Group     BP 03/14/22 0539 115/80     Pulse Rate 03/14/22 0539 (!) 40     Resp 03/14/22 0539 17     Temp 03/14/22 0539 98.1 F (36.7 C)     Temp Source 03/14/22 0758 Oral     SpO2 03/14/22 0539 100 %   Constitutional: Alert and oriented. Well appearing and in no acute distress. Eyes: Conjunctivae are normal.  Head: Atraumatic. Nose: No congestion/rhinnorhea. Mouth/Throat: Mucous membranes are moist.  Neck: No stridor.   Cardiovascular: Normal rate, regular rhythm. Good peripheral circulation. Grossly normal heart sounds.   Respiratory: Normal respiratory effort.  No retractions. Lungs CTAB. Gastrointestinal: Soft with mild tenderness in the epigastric region. No peritonitis. No distention.  Musculoskeletal: No lower extremity tenderness nor edema. No gross deformities of extremities. Neurologic:  Normal speech and language. No gross focal neurologic deficits are appreciated.  Skin:  Skin is warm, dry and intact. No rash noted.  ____________________________________________   LABS (all labs ordered are listed, but only abnormal results are displayed)  Labs Reviewed  CBC  WITH DIFFERENTIAL/PLATELET - Abnormal; Notable for the following components:      Result Value   RDW 17.7 (*)    All other components within normal limits  COMPREHENSIVE METABOLIC PANEL - Abnormal; Notable for the following components:   CO2 13 (*)    ALT 54 (*)    All other components within normal limits  MAGNESIUM - Abnormal; Notable for the following components:   Magnesium 0.5 (*)    All other components within normal limits  LIPASE,  BLOOD  TROPONIN I (HIGH SENSITIVITY)  TROPONIN I (HIGH SENSITIVITY)   ____________________________________________  EKG   EKG Interpretation  Date/Time:  Monday March 14 2022 05:36:29 EST Ventricular Rate:  75 PR Interval:  110 QRS Duration: 76 QT Interval:  458 QTC Calculation: 511 R Axis:   77 Text Interpretation: Sinus rhythm with short PR with frequent Premature ventricular complexes in a pattern of bigeminy Nonspecific ST and T wave abnormality Abnormal ECG When compared with ECG of 05-Mar-2022 17:20, PREVIOUS ECG IS PRESENT Confirmed by Nanda Quinton [id number]) on 03/14/2022 11:57:16 AM        ____________________________________________  RADIOLOGY  No results found.  ____________________________________________   PROCEDURES  Procedure(s) performed:   Procedures  None  ____________________________________________   INITIAL IMPRESSION / ASSESSMENT AND PLAN / ED COURSE  Pertinent labs & imaging results that were available during my care of the patient were reviewed by me and considered in my medical decision making (see chart for details).   This patient is Presenting for Evaluation of CP, which does require a range of treatment options, and is a complaint that involves a high risk of morbidity and mortality.  The Differential Diagnoses includes but is not exclusive to acute coronary syndrome, aortic dissection, pulmonary embolism, cardiac tamponade, community-acquired pneumonia, pericarditis, musculoskeletal chest wall pain, etc.  Differential diagnosis for epigastric pain includes but is not exclusive to acute cholecystitis, intrathoracic causes for epigastric abdominal pain, gastritis, duodenitis, pancreatitis, small bowel or large bowel obstruction, abdominal aortic aneurysm, hernia, gastritis, etc.    Critical Interventions-    Medications  sodium chloride 0.9 % bolus 1,000 mL (0 mLs Intravenous Stopped 03/14/22 1410)  ondansetron (ZOFRAN) injection 4  mg (4 mg Intravenous Given 03/14/22 1259)  morphine (PF) 4 MG/ML injection 4 mg (4 mg Intravenous Given 03/14/22 1300)  alum & mag hydroxide-simeth (MAALOX/MYLANTA) 200-200-20 MG/5ML suspension 30 mL (30 mLs Oral Given 03/14/22 1259)  magnesium sulfate IVPB 2 g 50 mL (0 g Intravenous Stopped 03/14/22 1514)  oxyCODONE-acetaminophen (PERCOCET/ROXICET) 5-325 MG per tablet 1 tablet (1 tablet Oral Given 03/14/22 1447)    Reassessment after intervention:  Symptoms improved.    I decided to review pertinent External Data, and in summary patient presents to the emergency department frequently for abdominal and chest pain complaints.  He has known chronic pancreatitis and polysubstance abuse.  He has had multiple instances of abdominal and chest imaging this year including CTA and multiple CT scans of the abdomen and pelvis along with multiple plain films of the abdomen and chest.  Clinical Laboratory Tests Ordered, included lipase normal at 31 which is difficult to interpret in the setting of chronic pancreatitis.  Troponin within normal limits x2.  No significant abnormality in LFTs or bilirubin.  No leukocytosis or severe anemia.  Radiologic Tests Ordered, included CXR. I independently interpreted the images and agree with radiology interpretation.   Cardiac Monitor Tracing which shows bigeminy.    Social Determinants of Health Risk patient drinks alcohol regularly and smokes  cigarettes.  Medical Decision Making: Summary:  Patient presents emergency department with chest and abdominal pain which seem most consistent with chronic pancreatitis.  Lab work and vital signs are reassuring.  He is not altered.  He is not febrile.  He has mild tenderness in the epigastric region without peritonitis.  I do not plan on CT or x-ray imaging of the abdomen and pelvis.  He did have a chest x-ray ordered from triage prior to my evaluation which seems reasonable given his chest discomfort but shows no acute abnormality.   EKG does show bigeminy appears new for the patient. Will add on Mg and reassess.   Reevaluation with update and discussion with patient. He is feeling improved and feeling ready for discharge.   Considered admission but symptoms improved. Normal vitals. Stable for discharge.   Disposition: discharge  ____________________________________________  FINAL CLINICAL IMPRESSION(S) / ED DIAGNOSES  Final diagnoses:  Epigastric pain  Hypomagnesemia     NEW OUTPATIENT MEDICATIONS STARTED DURING THIS VISIT:  Discharge Medication List as of 03/14/2022  3:26 PM     START taking these medications   Details  benzonatate (TESSALON) 100 MG capsule Take 1 capsule (100 mg total) by mouth 3 (three) times daily as needed for cough., Starting Mon 03/14/2022, Normal        Note:  This document was prepared using Dragon voice recognition software and may include unintentional dictation errors.  Nanda Quinton, MD, [facility] Emergency Medicine    Kenidy Crossland, Wonda [facility], MD 03/16/22 [phone]

## 2022-03-14 NOTE — ED Triage Notes (Signed)
Per EMS, pt c/o chest pain that work him up from sleep.  Pain is a 7/10 radiating to neck. Also c/o abdominal pain.    Pt not given ASA due to hx of ulcers  and no nitro due to having taken Viagra at noon.    20G in L forarm 114/74 70-100 hr 97% on on RA  COVID positive 9 days ago

## 2022-03-14 NOTE — ED Provider Triage Note (Signed)
Emergency Medicine Provider Triage Evaluation Note  [name] , a 51 y.o. male  was evaluated in triage.  Pt complains of left-sided chest pain that woke him from sleep this morning around 330.  7-10 radiates to the left neck, no palpitations or shortness of breath.  Has nitroglycerin at home but did not take it as he took the Viagra at noon on 11/5.  Also endorses pain associated with his chronic pancreatitis.  Nausea and vomiting as well.  Review of Systems  Positive: As above Negative: syncope  Physical Exam  BP 115/80 (BP Location: Right Arm)   Pulse (!) 40   Temp 98.1 F (36.7 C)   Resp 17   SpO2 100%  Gen:   Awake, no distress   Resp:  Normal effort  MSK:   Moves extremities without difficulty  Other:  Bradycardic with regular rhythm no M/R/G.  Lungs CTA B.  Medical Decision Making  Medically screening exam initiated at 6:21 AM.  Appropriate orders placed.  Demere Nain Rudd was informed that the remainder of the evaluation will be completed by another provider, this initial triage assessment does not replace that evaluation, and the importance of remaining in the ED until their evaluation is complete.  This chart was dictated using voice recognition software, Dragon. Despite the best efforts of this provider to proofread and correct errors, errors may still occur which can change documentation meaning.    Emeline Darling, PA-C 03/14/22 [phone]

## 2022-03-15 ENCOUNTER — Encounter: Payer: Self-pay | Admitting: Gastroenterology

## 2022-03-15 ENCOUNTER — Ambulatory Visit (INDEPENDENT_AMBULATORY_CARE_PROVIDER_SITE_OTHER): Payer: Medicaid Other | Admitting: Gastroenterology

## 2022-03-15 ENCOUNTER — Other Ambulatory Visit: Payer: Self-pay

## 2022-03-15 VITALS — BP 100/62 | HR 56 | Ht 68.0 in | Wt 115.0 lb

## 2022-03-15 DIAGNOSIS — F1020 Alcohol dependence, uncomplicated: Secondary | ICD-10-CM | POA: Diagnosis not present

## 2022-03-15 DIAGNOSIS — K219 Gastro-esophageal reflux disease without esophagitis: Secondary | ICD-10-CM

## 2022-03-15 DIAGNOSIS — G894 Chronic pain syndrome: Secondary | ICD-10-CM

## 2022-03-15 DIAGNOSIS — F17200 Nicotine dependence, unspecified, uncomplicated: Secondary | ICD-10-CM

## 2022-03-15 DIAGNOSIS — K860 Alcohol-induced chronic pancreatitis: Secondary | ICD-10-CM | POA: Diagnosis not present

## 2022-03-15 DIAGNOSIS — K529 Noninfective gastroenteritis and colitis, unspecified: Secondary | ICD-10-CM | POA: Diagnosis not present

## 2022-03-15 MED ORDER — NICOTINE POLACRILEX 4 MG MT GUM: 4.0000 mg | CHEWING_GUM | OROMUCOSAL | 0 refills | Status: DC | PRN

## 2022-03-15 MED ORDER — DULOXETINE HCL 30 MG PO CPEP: ORAL_CAPSULE | ORAL | 3 refills | Status: DC

## 2022-03-15 MED ORDER — SUCRALFATE 1 GM/10ML PO SUSP: 1.0000 g | Freq: Four times a day (QID) | ORAL | 2 refills | Status: DC

## 2022-03-15 MED ORDER — DICYCLOMINE HCL 10 MG PO CAPS: 10.0000 mg | ORAL_CAPSULE | Freq: Two times a day (BID) | ORAL | 5 refills | Status: DC

## 2022-03-15 MED ORDER — PANCRELIPASE (LIP-PROT-AMYL) 36000-114000 UNITS PO CPEP: 72000.0000 [IU] | ORAL_CAPSULE | Freq: Three times a day (TID) | ORAL | 5 refills | Status: DC

## 2022-03-15 MED ORDER — PANTOPRAZOLE SODIUM 40 MG PO TBEC: 40.0000 mg | DELAYED_RELEASE_TABLET | Freq: Two times a day (BID) | ORAL | 3 refills | Status: DC

## 2022-03-15 NOTE — Patient Instructions (Addendum)
_______________________________________________________  If you are age 52 or older, your body mass index should be between 23-30. Your Body mass index is 17.49 kg/m. If this is out of the aforementioned range listed, please consider follow up with your Primary Care Provider.  If you are age 68 or younger, your body mass index should be between 19-25. Your Body mass index is 17.49 kg/m. If this is out of the aformentioned range listed, please consider follow up with your Primary Care Provider.   ________________________________________________________  The [facility] providers would like to encourage you to use [facility] to communicate with providers for non-urgent requests or questions.  Due to long hold times on the telephone, sending your provider a message by [facility] may be a faster and more efficient way to get a response.  Please allow 48 business hours for a response.  Please remember that this is for non-urgent requests.  _______________________________________________________   Please try to stop smoking and all alcohol use.    We have sent the following medications to your pharmacy for you to pick up at your convenience: Cymbalta 30 mg: Take 1 daily at bedtime for 2 weeks and then increase to 2 capsules at bedtime Nicorette - use as directed  Bentyl: Take twice a day  You can take 2 capsules of Creon with meals to see if this helps.  Increase Protonix to twice a day.  We will refer you to [facility] and [facility].  Thank you for entrusting me with your care and for choosing [facility], Dr. Slater Cellar

## 2022-03-15 NOTE — Progress Notes (Signed)
Update med list

## 2022-03-15 NOTE — Progress Notes (Signed)
HPI :  51 year old male here for a follow-up visit for chronic pancreatitis, chronic abdominal pain.  History of peptic ulcer disease, history of alcohol and tobacco use.   See prior clinic notes for full details of his case.  He has had a history of pancreatitis in the past due to alcohol.  He has had a pretty extensive evaluation over the years as outlined below.   He has chronic abdominal pain that bothers him, mostly in his epigastric area to left mid abdomen.  Present typically all day, all the time, with fluctuations in the severity.  He feels nauseated frequently but typically does not vomit.  He is lost a few pounds over the past year.  He has chronic loose stools at baseline, has been on Creon which he states he is compliant with.  Also using Imodium which does not really help him as much.  He is on a regimen as outlined below, using Zofran for nausea which can help but he is not take it routinely.  He states he is on Protonix 40 mg once daily but still having some reflux and burning in the throat that bothering him, asking for liquid Carafate which has helped him in the past.  Recall he smokes tobacco routinely, he is down to about 1/2 pack a day from a full pack per day, he continues to struggle with this and asked for assistance.  He has not been able to stop drinking alcohol, he usually having about 3 alcoholic beverages per week although for the past week he has not used much.  He has been seen by pain management in the past, its been difficult to get him into a clinic for this.  He has used narcotics periodically in the past for it.  He was admitted in August for worsening symptoms, was in the hospital few days.  They put him on Cymbalta to help with chronic pain however it does not appear he continue that at time of discharge or at least it was stopped at some point along the line.  He is certainly willing and interested to take that again if it helps him.  We discussed his regimen at  length and long-term course.  On review of his chart he has had 8 imaging studies of his abdomen over the past year, mostly all of them show chronic pancreatitis without any other concerning or high risk lesions in his pancreas or elsewhere.    EGD 11/13/19 -  - Multiple small white plaques were found in the upper third of the esophagus, grossly consistent with esophageal candidiasis. - The exam of the esophagus was otherwise normal. - Patchy mild inflammation characterized by erythema was found in the gastric body and in the gastric antrum. - The exam of the stomach was otherwise normal. - Biopsies were taken with a cold forceps in the gastric body, at the incisura and in the gastric antrum for Helicobacter pylori testing. - One non-bleeding cratered duodenal ulcer with no stigmata of bleeding was found in the distal duodenal bulb (entering first portion duodenal sweep), surrounded by some nodular inflammatory appearing mucosa. The lesion was 4 mm in largest dimension. Biopsies were taken with a cold forceps for histology from the periphery of it / nodular mucosa. - Patchy inflammation characterized by erosions was found in the duodenal bulb. - The exam of the duodenum was otherwise normal.   Colonoscopy 11/13/19 - The perianal and digital rectal examinations were normal. - The terminal ileum appeared normal. -  Two sessile polyps were found in the transverse colon. The polyps were 3 to 7 mm in size. These polyps were removed with a cold snare. Resection and retrieval were complete. - A 3 to 4 mm polyp was found in the sigmoid colon. The polyp was sessile. The polyp was removed with a cold snare. Resection and retrieval were complete. - Internal hemorrhoids were found during retroflexion. The hemorrhoids were small. - The exam was otherwise without abnormality. There was some residual gum / seeds in the cecum which were lavaged / washed and no polyps / lesions noted in the cecum. - Biopsies  for histology were taken with a cold forceps from the right colon, left colon and transverse colon for evaluation of microscopic colitis.   1. Surgical [P], duodenal ulcer - PEPTIC DUODENITIS. EVIDENCE OF HEALING EROSION/ULCER. - WARTHIN-STARRY STAIN IS NEGATIVE FOR HELICOBACTER PYLORI. 2. Surgical [P], gastric antrum and gastric body - MILD CHRONIC GASTRITIS. MILD REACTION GASTROPATHY. - WARTHIN-STARRY STAIN IS NEGATIVE FOR HELICOBACTER PYLORI. 3. Surgical [P], colon, transverse, sigmoid, polyp (3) - TUBULAR ADENOMA, NEGATIVE FOR HIGH GRADE DYSPLASIA (X1). -HYPERPLASTIC POLYP (X2). 4. Surgical [P], colon, random sites - NO SIGNIFICANT PATHOLOGIC FINDINGS. - NEGATIVE FOR ACTIVE INFLAMMATION AND OTHER ABNORMALITIES.    MRCP 11/22/19 - IMPRESSION: 1. Findings of chronic pancreatitis with decreased and new/increased cystic lesions throughout compared to 08/08/2019 CT (given difficulty in cross modality comparison). These are likely a combination of foci of duct dilatation and intraparenchymal pseudocysts. No evidence of acute pancreatitis. 2. No biliary duct dilatation. 3.  Possible constipation. 4. Chronic gastric wall thickening, again suspicious for gastritis. 5.  Aortic Atherosclerosis (ICD10-I70.0).   Gastrin level 11/26/19 - 1635   Chromograinin A level 12/18/19 - 1296   EUS 01/02/20 - Medium sized (4.8cm) thick walled cystic lesion in the body of the pancreas. This was nearly completley aspirated, yielding 120cc of brownish, old blood tinged fluid with solid flecks of tissue which was sent for cytology, CEA and amylase. - Following cyst aspiration the pancreatic parenchyma showed pretty clear signs of chronic pancreatitis but no obvious mass lesions (except to the residual 'rind' from the cyst aspiration above). The changes of chronic pancreatitis signficantly limit the sensitivity of EUS (and other imaging modalities) to detect neolasm. - The previously noted duodenal bulb  ulcer is much improved in the interim.   Cytology  Negative Lipase / amylase level not returned      EGD 10/25/20: The Z-line was regular and was found 43 cm from the incisors. A 2 cm hiatal hernia was present. Patchy mildly erythematous mucosa without bleeding was found in the gastric body and in the gastric antrum - previously biopsied within the last 10 months so not rebiopsied. No other gross lesions were noted in the entire examined stomach. No gross lesions were noted in the duodenal bulb, in the first portion of the duodenum and in the second portion of the duodenum.  Flexible sig 10/25/20: A large amount of semi-liquid semi-solid stool was found in the entire colon, interfering with visualization. Lavage of the area was performed using copious amounts, resulting in clearance with fair visualization. A single (solitary) ten mm ulcer was found in the distal rectum. Oozing was present. It is not clear to me that this is truly a stercoral ulcer vs potential ulcer from recent enemas for today's procedure. I favor this to be from the enemas, but it needed to be treated. For hemostasis, three hemostatic clips were successfully placed (MR conditional). There was  no bleeding at the end of the procedure. Non-bleeding non-thrombosed internal hemorrhoids were found during retroflexion, during perianal exam and during digital exam. The hemorrhoids were Grade II (internal hemorrhoids that prolapse but reduce spontaneously).    Echo 02/16/22: EF 65-70%   RUQ [place] 02/17/22: IMPRESSION: Negative right upper quadrant ultrasound  CT abdomen / pelvis without contrast 01/20/22: IMPRESSION: 1. No acute intra-abdominal or pelvic pathology. No bowel obstruction. Normal appendix. 2. Sequela of chronic pancreatitis. 3. Aortic Atherosclerosis (ICD10-I70.0).  MRCP 12/22/21: IMPRESSION: 1. No evidence acute pancreatitis. Chronic cysts within the pancreatic body are improved from MRI 2021. No new  fluid collections. 2. Normal biliary tree.  No cholelithiasis. 3. No enhancing hepatic lesion. 4. Some limitation of exam due to patient being uncooperative.   CT scan abdomen / pelvis 12/11/21: IMPRESSION: 1. Slight indistinctness of peripancreatic fat planes may represent acute pancreatitis. 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 No evidence of new pancreatic collection. 2. Sequela of chronic pancreatitis with pancreatic calcifications. 3. Chronic gastric thickening, unchanged from prior exam. 4. Minimal left colonic diverticulosis without diverticulitis.  CT abdomen / pelvis 11/02/21: IMPRESSION: 1. Coarse calcifications consistent with chronic calcific pancreatitis. Questionable acute on chronic pancreatitis with trace stranding at the anterior head of the pancreas versus fat scarring from old pancreatitis. 2. 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 3. Gastroenteritis, constipation, diverticulosis and likely undigested medication in the small and large bowel. 4. Aortic atherosclerosis.   CT abdomen / pelvis 08/14/21 IMPRESSION: 1. Interval development of moderate right and small left pleural effusions with bibasilar atelectasis. 2. Sequelae of chronic pancreatitis. 3. Small lesions in the pancreas mm hypoattenuating are unchanged and may reflect chronic pseudocysts. 4. Aortic Atherosclerosis (ICD10-I70.0).   CT scan abdomen  / pelvis 06/06/21: IMPRESSION: 1. Mild acute pancreatitis. 2. 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 3. Air-fluid levels throughout small bowel loops. Findings are nonspecific, but can be seen with enteritis.    Past Medical History:  Diagnosis Date   Alcoholism /alcohol abuse    Anemia    Anxiety    Arthritis    knees; arms;  elbows (03/26/2015)   Asthma    Bipolar disorder (HCC)    Chronic bronchitis (HCC)    Chronic lower back pain    Chronic pancreatitis ([place])    Cocaine abuse ([place])    Depression    Family history of adverse reaction to anesthesia    Femoral condyle fracture ([place]) 03/08/2014   left medial/notes 03/09/2014   GERD (gastroesophageal reflux disease)    H/O hiatal hernia    H/O suicide attempt 10/2012   High cholesterol    History of blood transfusion 10/2012   when I tried to commit suicide   History of stomach ulcers    Hypertension    Marijuana abuse, continuous    Migraine    a few times/year (03/26/2015)   Pneumonia 1990's X 3   PTSD (post-traumatic stress disorder)    Sickle cell trait ([place])    WPW (Wolff-Parkinson-White syndrome)    [name] 03/06/2013     Past Surgical History:  Procedure Laterality Date   BIOPSY  11/25/2017   Procedure: BIOPSY;  Surgeon: Arta Silence, MD;  Location: [place];  Service: Endoscopy;;   BIOPSY  10/14/2018   Procedure: BIOPSY;  Surgeon: Arta Silence, MD;  Location: [place];  Service: Endoscopy;;   CARDIAC CATHETERIZATION     CYST ENTEROSTOMY  01/02/2020   Procedure: CYST ASPIRATION;  Surgeon: Milus Banister, MD;  Location: [facility];  Service: Endoscopy;;   ESOPHAGOGASTRODUODENOSCOPY (EGD) WITH PROPOFOL N/A 11/25/2017   Procedure: ESOPHAGOGASTRODUODENOSCOPY (EGD) WITH PROPOFOL;  Surgeon: Arta Silence, MD;  Location: [place];  Service: Endoscopy;  Laterality: N/A;   ESOPHAGOGASTRODUODENOSCOPY (EGD) WITH PROPOFOL Left 10/14/2018   Procedure: ESOPHAGOGASTRODUODENOSCOPY (EGD) WITH PROPOFOL;  Surgeon: Arta Silence, MD;  [facility];  Service: Endoscopy;  Laterality: Left;   ESOPHAGOGASTRODUODENOSCOPY (EGD) WITH PROPOFOL N/A 11/14/2018   Procedure: ESOPHAGOGASTRODUODENOSCOPY (EGD) WITH PROPOFOL;  Surgeon: Laurence Spates, MD;  Location: [facility];  Service: Gastroenterology;  Laterality: N/A;    ESOPHAGOGASTRODUODENOSCOPY (EGD) WITH PROPOFOL N/A 01/02/2020   Procedure: ESOPHAGOGASTRODUODENOSCOPY (EGD) WITH PROPOFOL;  Surgeon: Milus Banister, MD;  Location: [facility];  Service: Endoscopy;  Laterality: N/A;   ESOPHAGOGASTRODUODENOSCOPY (EGD) WITH PROPOFOL N/A 10/25/2020   Procedure: ESOPHAGOGASTRODUODENOSCOPY (EGD) WITH PROPOFOL;  Surgeon: Rush Landmark Telford Nab., MD;  Location: [place];  Service: Gastroenterology;  Laterality: N/A;   EUS N/A 01/02/2020   Procedure: UPPER ENDOSCOPIC ULTRASOUND (EUS) RADIAL;  Surgeon: Milus Banister, MD;  Location: [facility];  Service: Endoscopy;  Laterality: N/A;   EYE SURGERY Left 1990's   "result of trauma"    FACIAL FRACTURE SURGERY Left 1990's   "result of trauma"    FLEXIBLE SIGMOIDOSCOPY N/A 10/25/2020   Procedure: FLEXIBLE SIGMOIDOSCOPY;  Surgeon: Irving Copas., MD;  Location: [place];  Service: Gastroenterology;  Laterality: N/A;   FRACTURE SURGERY     HEMOSTASIS CLIP PLACEMENT  10/25/2020   Procedure: HEMOSTASIS CLIP PLACEMENT;  Surgeon: Irving Copas., MD;  Location: [place];  Service: Gastroenterology;;   HERNIA REPAIR     LEFT HEART CATHETERIZATION WITH CORONARY ANGIOGRAM Right 03/07/2013   Procedure: LEFT HEART CATHETERIZATION WITH CORONARY ANGIOGRAM;  Surgeon: [name], MD;  Location: [place] CATH LAB;  Service: Cardiovascular;  Laterality: Right;   UMBILICAL HERNIA REPAIR     UPPER GASTROINTESTINAL ENDOSCOPY     Family History  Problem Relation Age of Onset   Hypertension Mother    Cirrhosis Father    Alcoholism Father    Hypertension Father    Melanoma Father    Hypertension Other    Coronary artery disease Other    Social History   Tobacco Use   Smoking status: Every Day    Packs/day: 1.00    Years: 36.00    Total pack years: 36.00    Types: Cigarettes, E-cigarettes   Smokeless tobacco: Never  Vaping Use   Vaping Use: Former  Substance Use Topics   Alcohol use: Not Currently     Comment: last drink a few days ago   Drug use: Not Currently    Types: Marijuana, Cocaine   Current Outpatient Medications  Medication Sig Dispense Refill   dicyclomine (BENTYL) 10 MG capsule Take 1 capsule (10 mg total) by mouth 2 (two) times daily. 60 capsule 5   DULoxetine (CYMBALTA) 30 MG capsule Take 1 capsule daily at bedtime for 2 weeks and then increase to 2 capsules at bedtime 60 capsule 3   nicotine polacrilex (NICORETTE STARTER KIT) 4 MG gum Take 1 each (4 mg total) by mouth as needed for smoking cessation. 100 tablet 0   sucralfate (CARAFATE) 1 GM/10ML suspension Take 10 mLs (1 g total) by mouth 4 (four) times daily. 420 mL 2   acetaminophen (TYLENOL) 500 MG tablet Take 1,000  mg by mouth every 6 (six) hours as needed for moderate pain or headache.     acetaminophen (TYLENOL) 650 MG CR tablet Take 1 tablet (650 mg total) by mouth every 8 (eight) hours as needed for pain. 10 tablet 0   albuterol (VENTOLIN HFA) 108 (90 Base) MCG/ACT inhaler Inhale 2 puffs into the lungs every 4 (four) hours as needed for wheezing or shortness of breath. 18 g 0   amLODipine (NORVASC) 10 MG tablet Take 1 tablet (10 mg total) by mouth daily. 30 tablet 0   benzonatate (TESSALON) 100 MG capsule Take 1 capsule (100 mg total) by mouth 3 (three) times daily as needed for cough. 21 capsule 0   dextromethorphan-guaiFENesin (MUCINEX DM) 30-600 MG 12hr tablet Take 1 tablet by mouth 2 (two) times daily. 20 tablet 0   folic acid (FOLVITE) 1 MG tablet Take 1 tablet (1 mg total) by mouth daily. 30 tablet 0   HYDROcodone-acetaminophen (NORCO/VICODIN) 5-325 MG tablet Take 1 tablet by mouth every 12 (twelve) hours as needed for moderate pain. 15 tablet 0   lidocaine (LMX) 4 % cream Apply topically 4 (four) times daily as needed (NGT pain). 30 g 0   lipase/protease/amylase (CREON) 36000 UNITS CPEP capsule Take 2 capsules (72,000 Units total) by mouth 3 (three) times daily with meals. 180 capsule 0   loperamide (IMODIUM) 2  MG capsule Take 1 capsule (2 mg total) by mouth 4 (four) times daily as needed for diarrhea or loose stools. 12 capsule 0   magnesium oxide (MAG-OX) 400 (240 Mg) MG tablet Take 1 tablet (400 mg total) by mouth daily. 8 tablet 0   metoprolol succinate (TOPROL XL) 25 MG 24 hr tablet Take 1 tablet (25 mg total) by mouth as needed (for palpitations). To not exceed 1 dose per day. (Patient taking differently: Take 50 mg by mouth daily.) 30 tablet 3   metoprolol succinate (TOPROL-XL) 200 MG 24 hr tablet Take 1 tablet (200 mg total) by mouth daily. Take with or immediately following a meal. 90 tablet 3   Multiple Vitamin (MULTIVITAMIN WITH MINERALS) TABS tablet Take 1 tablet by mouth daily. 30 tablet 0   nicotine (NICODERM CQ - DOSED IN MG/24 HOURS) 21 mg/24hr patch Place 1 patch (21 mg total) onto the skin at bedtime. 28 patch 0   nitroGLYCERIN (NITROSTAT) 0.4 MG SL tablet Place 1 tablet (0.4 mg total) under the tongue every 5 (five) minutes as needed for chest pain. Do not exceed 3 doses in a row. 25 tablet 3   ondansetron (ZOFRAN) 4 MG tablet Take 1 tablet (4 mg total) by mouth every 6 (six) hours. 12 tablet 0   ondansetron (ZOFRAN) 4 MG tablet Take 1 tablet (4 mg total) by mouth every 8 (eight) hours as needed for nausea or vomiting. 20 tablet 0   oxyCODONE (ROXICODONE) 5 MG immediate release tablet Take 1 tablet (5 mg total) by mouth every 4 (four) hours as needed for severe pain. 8 tablet 0   oxyCODONE-acetaminophen (PERCOCET/ROXICET) 5-325 MG tablet Take 1 tablet by mouth every 6 (six) hours as needed for severe pain. 8 tablet 0   pantoprazole (PROTONIX) 40 MG tablet Take 1 tablet (40 mg total) by mouth 2 (two) times daily. 60 tablet 3   Polyethyl Glycol-Propyl Glycol (SYSTANE OP) Place 1 drop into both eyes 4 (four) times daily as needed (for dryness).     sucralfate (CARAFATE) 1 g tablet Take 1 tablet (1 g total) by mouth 4 (four) times  daily -  with meals and at bedtime. 30 tablet 0   thiamine 100  MG tablet Take 1 tablet (100 mg total) by mouth daily. 30 tablet 0   Vitamin D, Ergocalciferol, (DRISDOL) 1.25 MG (50000 UNIT) CAPS capsule Take 50,000 Units by mouth every Wednesday.     No current facility-administered medications for this visit.   Allergies  Allergen Reactions   Robaxin [Methocarbamol] Other (See Comments)    jumpy limbs   Aspirin Other (See Comments)    Unknown reaction   Shellfish-Derived Products Nausea And Vomiting and Rash   Trazodone And Nefazodone Other (See Comments)    Muscle spasms   Adhesive [Tape] Itching   Latex Itching   Toradol [Ketorolac Tromethamine] Other (See Comments)    Has ulcers; cannot have this   Contrast Media [Iodinated Contrast Media] Hives   Reglan [Metoclopramide] Other (See Comments)    Muscle spasms   Salmon [Fish Oil] Nausea And Vomiting and Rash     Review of Systems: All systems reviewed and negative except where noted in HPI.    Lab Results  Component Value Date   WBC 6.5 03/14/2022   HGB 13.2 03/14/2022   HCT 42.7 03/14/2022   MCV 97.7 03/14/2022   PLT 234 03/14/2022    Lab Results  Component Value Date   CREATININE 0.71 03/14/2022   BUN 8 03/14/2022   NA 137 03/14/2022   K 4.4 03/14/2022   CL 111 03/14/2022   CO2 13 (L) 03/14/2022    Lab Results  Component Value Date   ALT 54 (H) 03/14/2022   AST 41 03/14/2022   ALKPHOS 93 03/14/2022   BILITOT 0.3 03/14/2022     Physical Exam: BP 100/62   Pulse (!) 56   Ht _0  (1.727 m)   Wt 115 lb (52.2 kg)   BMI 17.49 kg/m  Constitutional: Pleasant,male in no acute distress. Abdominal: Soft, nondistended, nontender. There are no masses palpable. Neurological: Alert and oriented to person place and time. Psychiatric: Normal mood and affect. Behavior is normal.   ASSESSMENT: 51 y.o. male here for assessment of the following  1. Alcohol-induced chronic pancreatitis ([place])   2. Chronic pain syndrome   3. Chronic diarrhea   4. Alcohol use disorder,  severe, dependence ([place])   5. Tobacco use disorder   6. Gastroesophageal reflux disease, unspecified whether esophagitis present    Chronic pancreatitis causing chronic pain syndrome which has been difficult to manage for him over time.  He has had numerous ED visits this year with 8 imaging studies, none of which have shown any other clear cause.  We discussed his chronic pancreatitis and course at length.  Big risk factors are continued alcohol and tobacco use, I counseled him I do not think he is going to get better unless he is able to stop these.  He needs assistance to stop them.  I will refer him to behavioral health for alcohol and tobacco use.  He is agreeable to this.  I will also prescribe him nicotine gum as he states he has a hard time affording this over-the-counter.  As far as his medical regimen for chronic pain.  He was previously given Cymbalta back in the hospital and I think this would be a very good choice for him long-term to help with his chronic pain.  We will add back Cymbalta 30 mg daily for a few weeks and then he can increase back to 60 mg daily.  I will add Bentyl  10 mg twice daily to see if this will help his pain at all and to decrease his stools.  His Creon is based on his weight, he can use to caps with each meals, increase to 3 tabs per meal at max based on his weight.  He is having worsening reflux recently, will increase his Protonix to 40 mg twice daily and per his request we will give him some liquid Carafate to use as needed for breakthrough.  He should continue Zofran as needed.  He needs to follow-up with pain management for chronic pain if he wants to have narcotics for this long-term, I do not prescribe long-term narcotics in my clinic for this issue.  He inquires about other pain management offices, he can let me know where he wants to be seen him happy to refer him.  He agrees.  Of note, he has no primary care physician, referred to [facility] primary care.  PLAN: -  start back cymbalta 7m / day for a few weeks and then increase to 643m/ day - bentyl 1026mID added - continue Creon - he is currently weight based, max dose is 3 tabs at a time - stop alcohol and tobacco - counseled extensively on this - refer to behavioral health for alcohol dependence - prescribe Nicotine gum - refer to [facility] primary care - increase protonix to BID dosing - liquid carafate 10cc po q 8 hours PRN #1 bottle RF1 - continue Zofran PRN - f/u pain management  SteJolly MangoD [facility]

## 2022-03-16 NOTE — Progress Notes (Deleted)
Cardiology Office Note:    Date:  03/16/2022   ID:  [name], DOB 1970-06-04, MRN [id number]  PCP:  Kathyrn Lass   [facility] Providers Cardiologist:  Minus Breeding, MD Cardiology APP:  Ledora Bottcher, PA { Click to update primary MD,subspecialty MD or APP then REFRESH:1}    Referring MD: No ref. provider found   No chief complaint on file. ***  History of Present Illness:    [name] is a 51 y.o. male with a hx of WPW s/p ablation, substance use, alcohol abuse, and alcoholic pancreatitis.  Echocardiogram 2022 with mild concentric LVH.  He has been seen in the ER several times for chest and abdominal pain felt related to chronic pancreatitis.  No coronary disease by heart cath in 2014.  EKG in 2014 with preexcitation ST segment changes consistent with WPW.  Per patient report, he states he underwent WPW ablation at ?2014/2015.  He has done well until recently.  He reports having rapid heart rate sometimes in the 190s but mostly 135.  He was recently hospitalized at 12/06/2021 with chest and abdominal pain found to be in A-fib RVR.  He converted spontaneously.  Telemetry notable for some aberrant conduction.  Of note, he had been out of his Toprol for about a month prior to this evaluation.  Toprol was restarted at 100 mg daily.  He does have a history of cocaine use but was tolerating this beta-blocker in the past and is working on negative drug screens to get into pain clinic for pancreatitis.  Given his low CHA2DS2-VASc score, decision was made to not anticoagulate.  Echocardiogram with preserved EF and no significant valvular disease.  I saw him in follow up in Aug 2023 and he reported worsening CP with exertion. I opted to repeat a nuclear stress test (did not feel CT coronary would be his best option due to ectopy on EKG already on 100 mg toprol). Nuclear stress test 01/2022 was nonischemic, but reduced EF. Follow up limited echo showed LVEF 65-70% with  mild LVH, normal RV, and no significant valvular disease. Heart monitor showed  Unfortunately he presented back to the ER for chest pain and palpitations treated with adenosine by EMS. Trigger seems to be alcohol or abdominal pain with recurrent pancreatitis. Cardiology was consulted with plans for EP evaluation but he left he hospital AMA. He was seen by Dr. Percival Spanish in follow up on 02/14/22 with persistent Afib and elevated rates. He also reported ongoing chest pain felt to be atypical. Dr. Percival Spanish increased toprol to 200 mg daily. Since that visit, he has had three additional ER visits for chest pain and epigastric pain, no admissions. Pt noted to be bradycardic in the 30s on telemetry during one of these ER visits.   He presents today for routine follow up.     Chest pain Not angina in nature Nonischemic nuclear stress test 01/2022 and unremarkable echocardiogram Felt to be atypical   A-fib with RVR Heart monitor never completed Managed on 200 mg Toprol Not anticoagulated for CHA2DS2-VASc of 1 He has PRN toprol to use   History of WPW s/p ablation Now with persistent Afib   Hypertension Managed with 10 mg amlodipine, toprol   Substance abuse Drug seeking Continues to drink alcohol and smokes Seeking anti-anxiety or pain medications at last visit      Past Medical History:  Diagnosis Date   Alcoholism /alcohol abuse    Anemia    Anxiety  Arthritis    knees; arms; elbows (03/26/2015)   Asthma    Bipolar disorder (HCC)    Chronic bronchitis (HCC)    Chronic lower back pain    Chronic pancreatitis ([place])    Cocaine abuse ([place])    Depression    Family history of adverse reaction to anesthesia    Femoral condyle fracture ([place]) 03/08/2014   left medial/notes 03/09/2014   GERD (gastroesophageal reflux disease)    H/O hiatal hernia    H/O suicide attempt 10/2012   High cholesterol    History of blood transfusion 10/2012   when I tried to commit suicide   History  of stomach ulcers    Hypertension    Marijuana abuse, continuous    Migraine    a few times/year (03/26/2015)   Pneumonia 1990's X 3   PTSD (post-traumatic stress disorder)    Sickle cell trait ([place])    WPW (Wolff-Parkinson-White syndrome)    [name] 03/06/2013    Past Surgical History:  Procedure Laterality Date   BIOPSY  11/25/2017   Procedure: BIOPSY;  Surgeon: Arta Silence, MD;  Location: [facility];  Service: Endoscopy;;   BIOPSY  10/14/2018   Procedure: BIOPSY;  Surgeon: Arta Silence, MD;  Location: [facility];  Service: Endoscopy;;   CARDIAC CATHETERIZATION     CYST ENTEROSTOMY  01/02/2020   Procedure: CYST ASPIRATION;  Surgeon: Milus Banister, MD;  Location: [facility];  Service: Endoscopy;;   ESOPHAGOGASTRODUODENOSCOPY (EGD) WITH PROPOFOL N/A 11/25/2017   Procedure: ESOPHAGOGASTRODUODENOSCOPY (EGD) WITH PROPOFOL;  Surgeon: Arta Silence, MD;  Location[facility] ENDOSCOPY;  Service: Endoscopy;  Laterality: N/A;   ESOPHAGOGASTRODUODENOSCOPY (EGD) WITH PROPOFOL Left 10/14/2018   Procedure: ESOPHAGOGASTRODUODENOSCOPY (EGD) WITH PROPOFOL;  Surgeon: Arta Silence, MD;  Location: [facility] ENDOSCOPY;  Service: Endoscopy;  Laterality: Left;   ESOPHAGOGASTRODUODENOSCOPY (EGD) WITH PROPOFOL N/A 11/14/2018   Procedure: ESOPHAGOGASTRODUODENOSCOPY (EGD) WITH PROPOFOL;  Surgeon: Laurence Spates, MD;  Location: [facility];  Service: Gastroenterology;  Laterality: N/A;   ESOPHAGOGASTRODUODENOSCOPY (EGD) WITH PROPOFOL N/A 01/02/2020   Procedure: ESOPHAGOGASTRODUODENOSCOPY (EGD) WITH PROPOFOL;  Surgeon: Milus Banister, MD;  Location: [facility];  Service: Endoscopy;  Laterality: N/A;   ESOPHAGOGASTRODUODENOSCOPY (EGD) WITH PROPOFOL N/A 10/25/2020   Procedure: ESOPHAGOGASTRODUODENOSCOPY (EGD) WITH PROPOFOL;  Surgeon: Rush Landmark Telford Nab., MD;  Location: [place];  Service: Gastroenterology;  Laterality: N/A;   EUS N/A 01/02/2020   Procedure: UPPER ENDOSCOPIC ULTRASOUND (EUS) RADIAL;   Surgeon: Milus Banister, MD;  Location: [facility];  Service: Endoscopy;  Laterality: N/A;   EYE SURGERY Left 1990's   "result of trauma"    FACIAL FRACTURE SURGERY Left 1990's   "result of trauma"    FLEXIBLE SIGMOIDOSCOPY N/A 10/25/2020   Procedure: FLEXIBLE SIGMOIDOSCOPY;  Surgeon: Irving Copas., MD;  Location: [facility];  Service: Gastroenterology;  Laterality: N/A;   FRACTURE SURGERY     HEMOSTASIS CLIP PLACEMENT  10/25/2020   Procedure: HEMOSTASIS CLIP PLACEMENT;  Surgeon: Irving Copas., MD;  Location: [facility];  Service: Gastroenterology;;   HERNIA REPAIR     LEFT HEART CATHETERIZATION WITH CORONARY ANGIOGRAM Right 03/07/2013   Procedure: LEFT HEART CATHETERIZATION WITH CORONARY ANGIOGRAM;  Surgeon: [name], MD;  Location: [facility] CATH LAB;  Service: Cardiovascular;  Laterality: Right;   UMBILICAL HERNIA REPAIR     UPPER GASTROINTESTINAL ENDOSCOPY      Current Medications: No outpatient medications have been marked as taking for the 03/28/22 encounter (Appointment) with Ledora Bottcher, [place].     Allergies:  Robaxin [methocarbamol], Aspirin, Shellfish-derived products, Trazodone and nefazodone, Adhesive [tape], Latex, Toradol [ketorolac tromethamine], Contrast media [iodinated contrast media], Reglan [metoclopramide], and Salmon [fish oil]   Social History   Socioeconomic History   Marital status: Single    Spouse name: Not on file   Number of children: Not on file   Years of education: Not on file   Highest education level: Not on file  Occupational History   Occupation: disabled  Tobacco Use   Smoking status: Every Day    Packs/day: 1.00    Years: 36.00    Total pack years: 36.00    Types: Cigarettes, E-cigarettes   Smokeless tobacco: Never  Vaping Use   Vaping Use: Former  Substance and Sexual Activity   Alcohol use: Not Currently    Comment: last drink a few days ago   Drug use: Not Currently    Types: Marijuana, Cocaine    Sexual activity: Yes    Birth control/protection: None  Other Topics Concern   Not on file  Social History Narrative   ** Merged History Encounter **       Social Determinants of Health   Financial Resource Strain: Not on file  Food Insecurity: Not on file  Transportation Needs: Not on file  Physical Activity: Not on file  Stress: Not on file  Social Connections: Not on file     Family History: The patient's ***family history includes Alcoholism in his father; Cirrhosis in his father; Coronary artery disease in an other family member; Hypertension in his father, mother, and another family member; Melanoma in his father.  ROS:   Please see the history of present illness.    *** All other systems reviewed and are negative.  EKGs/Labs/Other Studies Reviewed:    The following studies were reviewed today: ***  EKG:  EKG is *** ordered today.  The ekg ordered today demonstrates ***  Recent Labs: 01/20/2022: B Natriuretic Peptide 116.7; TSH 2.161 03/14/2022: ALT 54; BUN 8; Creatinine, Ser 0.71; Hemoglobin 13.2; Magnesium 0.5; Platelets 234; Potassium 4.4; Sodium 137  Recent Lipid Panel    Component Value Date/Time   CHOL 175 10/30/2020 0545   TRIG 142 06/07/2021 0239   HDL 89 10/30/2020 0545   CHOLHDL 2.0 10/30/2020 0545   VLDL 8 10/30/2020 0545   LDLCALC 78 10/30/2020 0545     Risk Assessment/Calculations:   {Does this patient have ATRIAL FIBRILLATION?:[phone]}            Physical Exam:    VS:  There were no vitals taken for this visit.    Wt Readings from Last 3 Encounters:  03/15/22 115 lb (52.2 kg)  02/17/22 123 lb 7.3 oz (56 kg)  02/14/22 123 lb 3.2 oz (55.9 kg)     GEN: *** Well nourished, well developed in no acute distress HEENT: Normal NECK: No JVD; No carotid bruits LYMPHATICS: No lymphadenopathy CARDIAC: ***RRR, no murmurs, rubs, gallops RESPIRATORY:  Clear to auscultation without rales, wheezing or rhonchi  ABDOMEN: Soft, non-tender,  non-distended MUSCULOSKELETAL:  No edema; No deformity  SKIN: Warm and dry NEUROLOGIC:  Alert and oriented x 3 PSYCHIATRIC:  Normal affect   ASSESSMENT:    No diagnosis found. PLAN:    In order of problems listed above:  ***      {Are you ordering a CV Procedure (e.g. stress test, cath, DCCV, TEE, etc)?   Press F2        :[id number]}    Medication Adjustments/Labs and Tests Ordered: Current medicines  are reviewed at length with the patient today.  Concerns regarding medicines are outlined above.  No orders of the defined types were placed in this encounter.  No orders of the defined types were placed in this encounter.   There are no Patient Instructions on file for this visit.   Signed, Ledora Bottcher, [name]  03/16/2022 10:24 AM    [place]

## 2022-03-17 MED FILL — Amiodarone HCl Inj 150 MG/3ML (50 MG/ML): INTRAVENOUS | Qty: 3 | Status: AC

## 2022-03-18 ENCOUNTER — Other Ambulatory Visit: Payer: Self-pay

## 2022-03-18 DIAGNOSIS — F1010 Alcohol abuse, uncomplicated: Secondary | ICD-10-CM

## 2022-03-18 DIAGNOSIS — K860 Alcohol-induced chronic pancreatitis: Secondary | ICD-10-CM

## 2022-03-18 NOTE — Progress Notes (Signed)
New referral for Behavioral Health submitted to location on [address] in [place]

## 2022-03-19 IMAGING — DX DG CHEST 2V
2 series · 2 of 2 positions shown · non-contrast
Comparison: September 19, 2019

CLINICAL DATA: Chest pain.

EXAM:
CHEST - 2 VIEW

[chest pa]
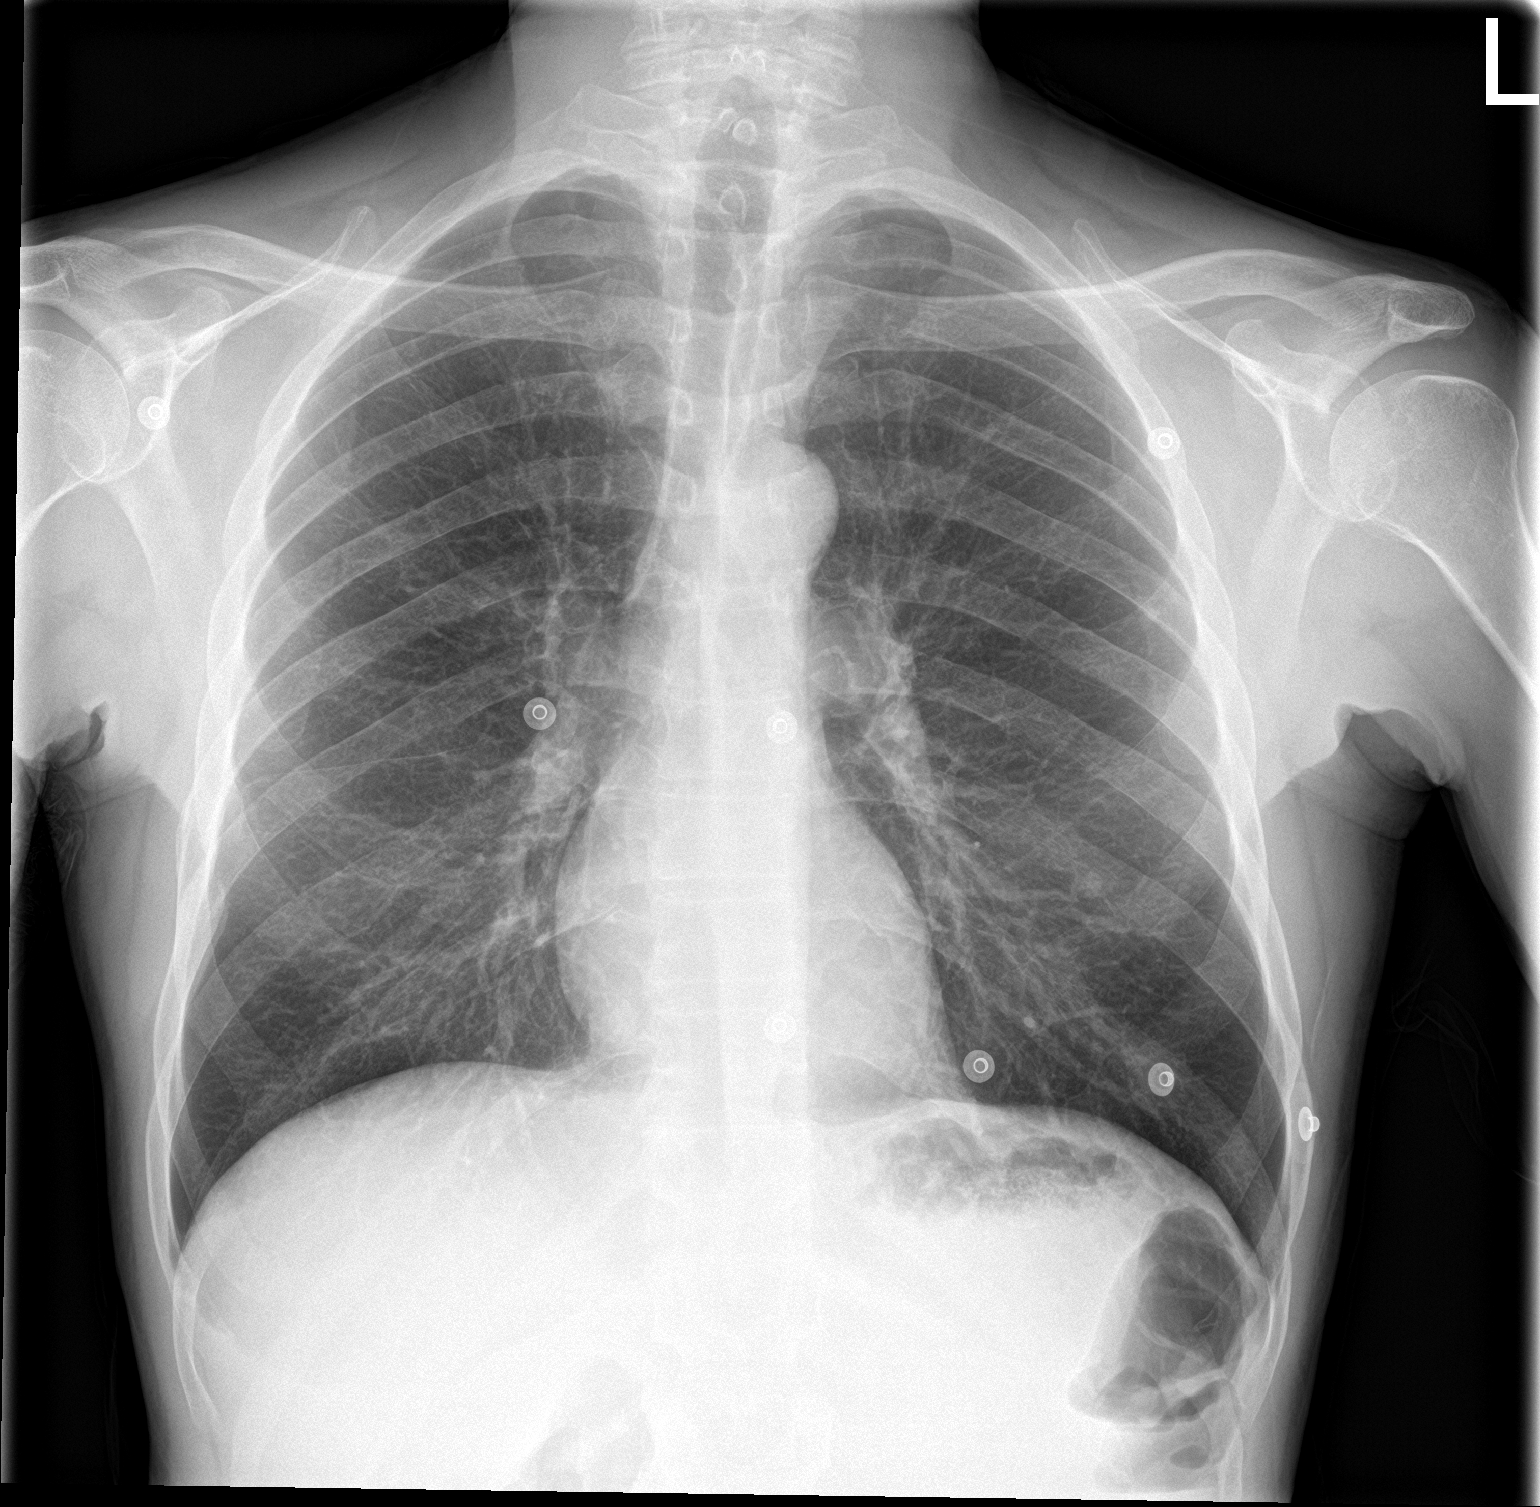

[chest lat]
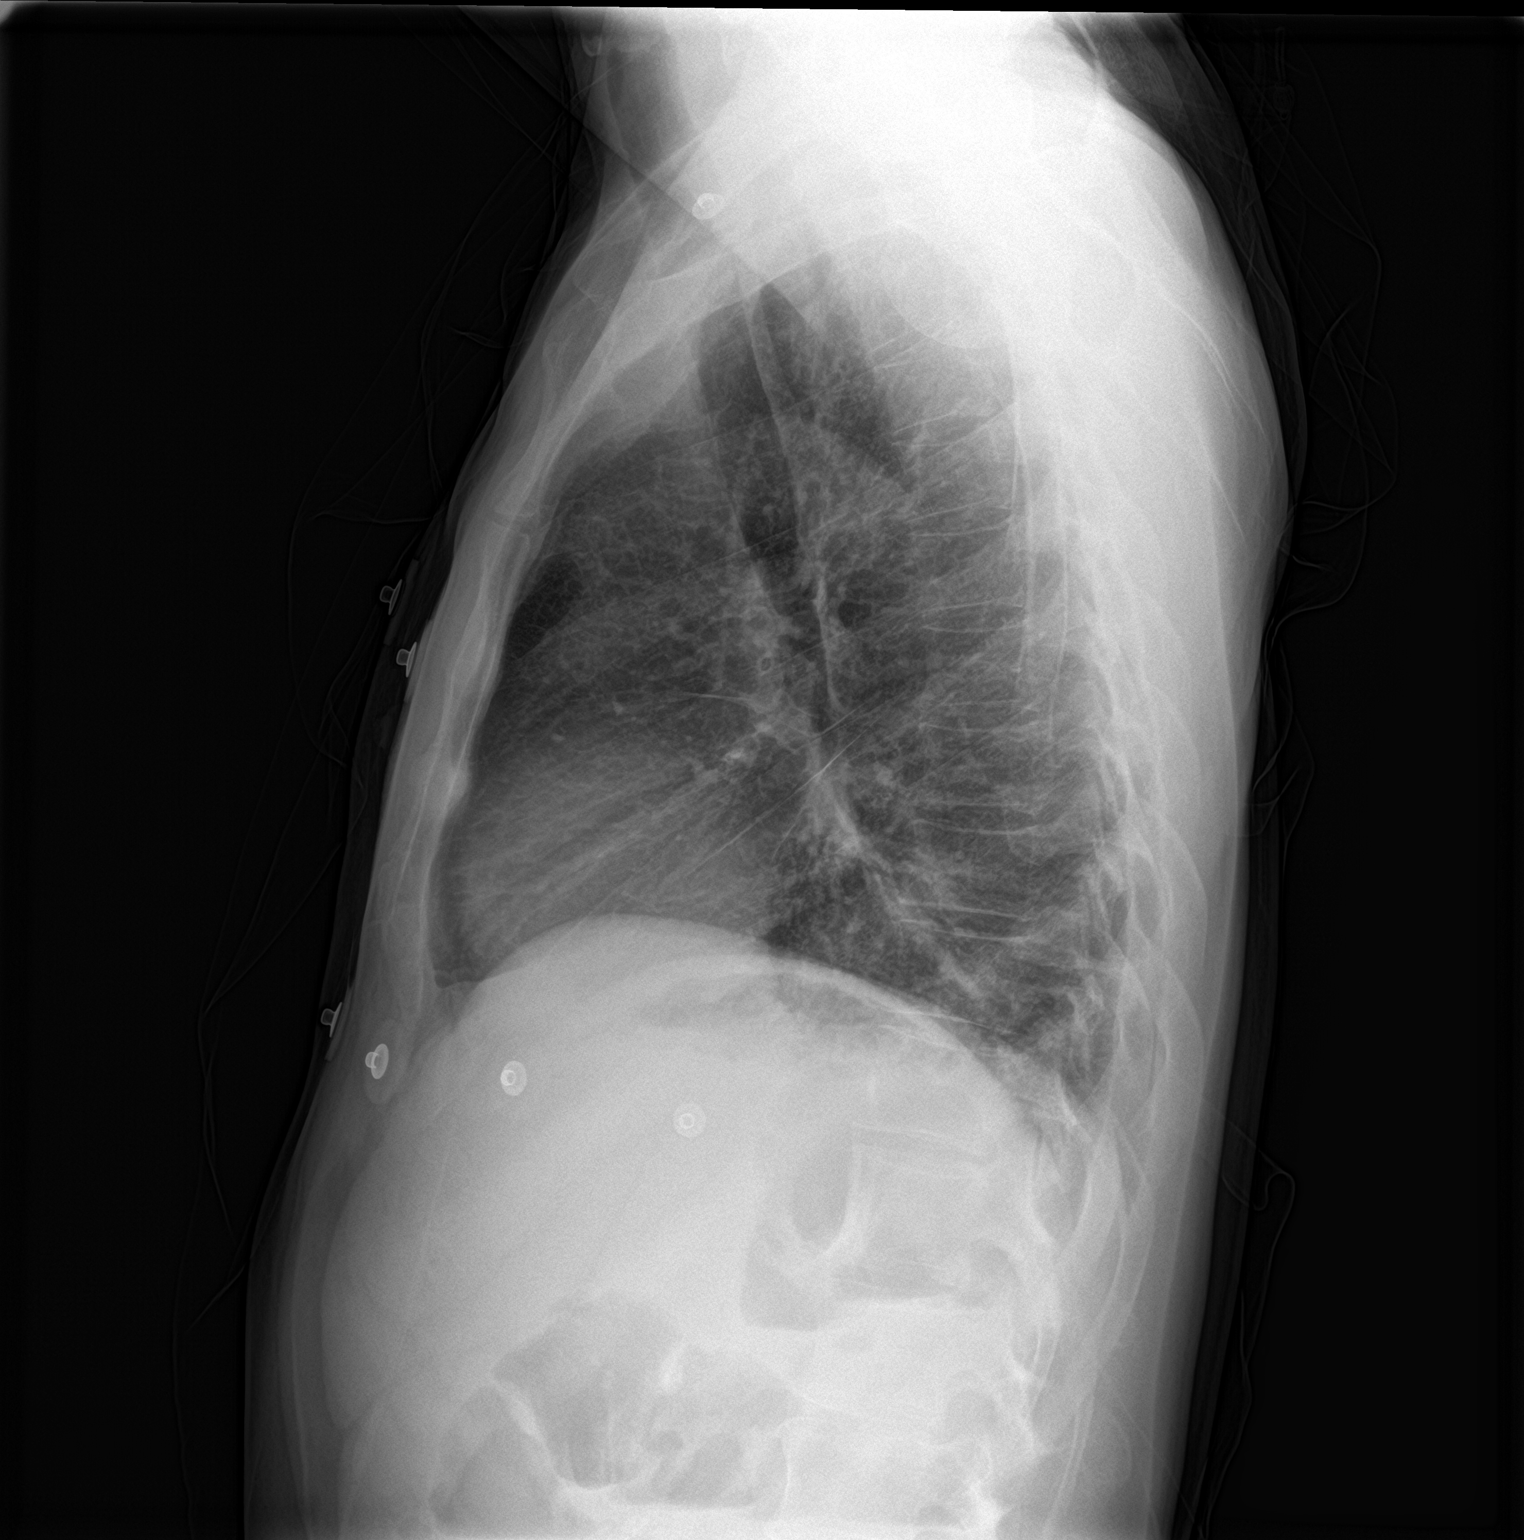

[2 of 2 positions shown; findings below may reference images not displayed]

FINDINGS: There is no evidence of acute infiltrate, pleural effusion or
pneumothorax. A 5 mm diameter slightly nodular appearing opacity is
seen overlying the mid to lower left lung. This is not clearly seen
on the prior study. The heart size and mediastinal contours are
within normal limits. The visualized skeletal structures are
unremarkable.
IMPRESSION: 1. 5 mm diameter slightly nodular appearing opacity overlying the
mid to lower left lung. While this may represent a nipple shadow, a
small lung nodule cannot be excluded.
2. No acute cardiopulmonary disease.

## 2022-03-20 IMAGING — CR DG CHEST 2V
2 series · 2 of 2 positions shown · non-contrast
Comparison: 09/27/2019

CLINICAL DATA: Chest pain

EXAM:
CHEST - 2 VIEW

[chest pa]
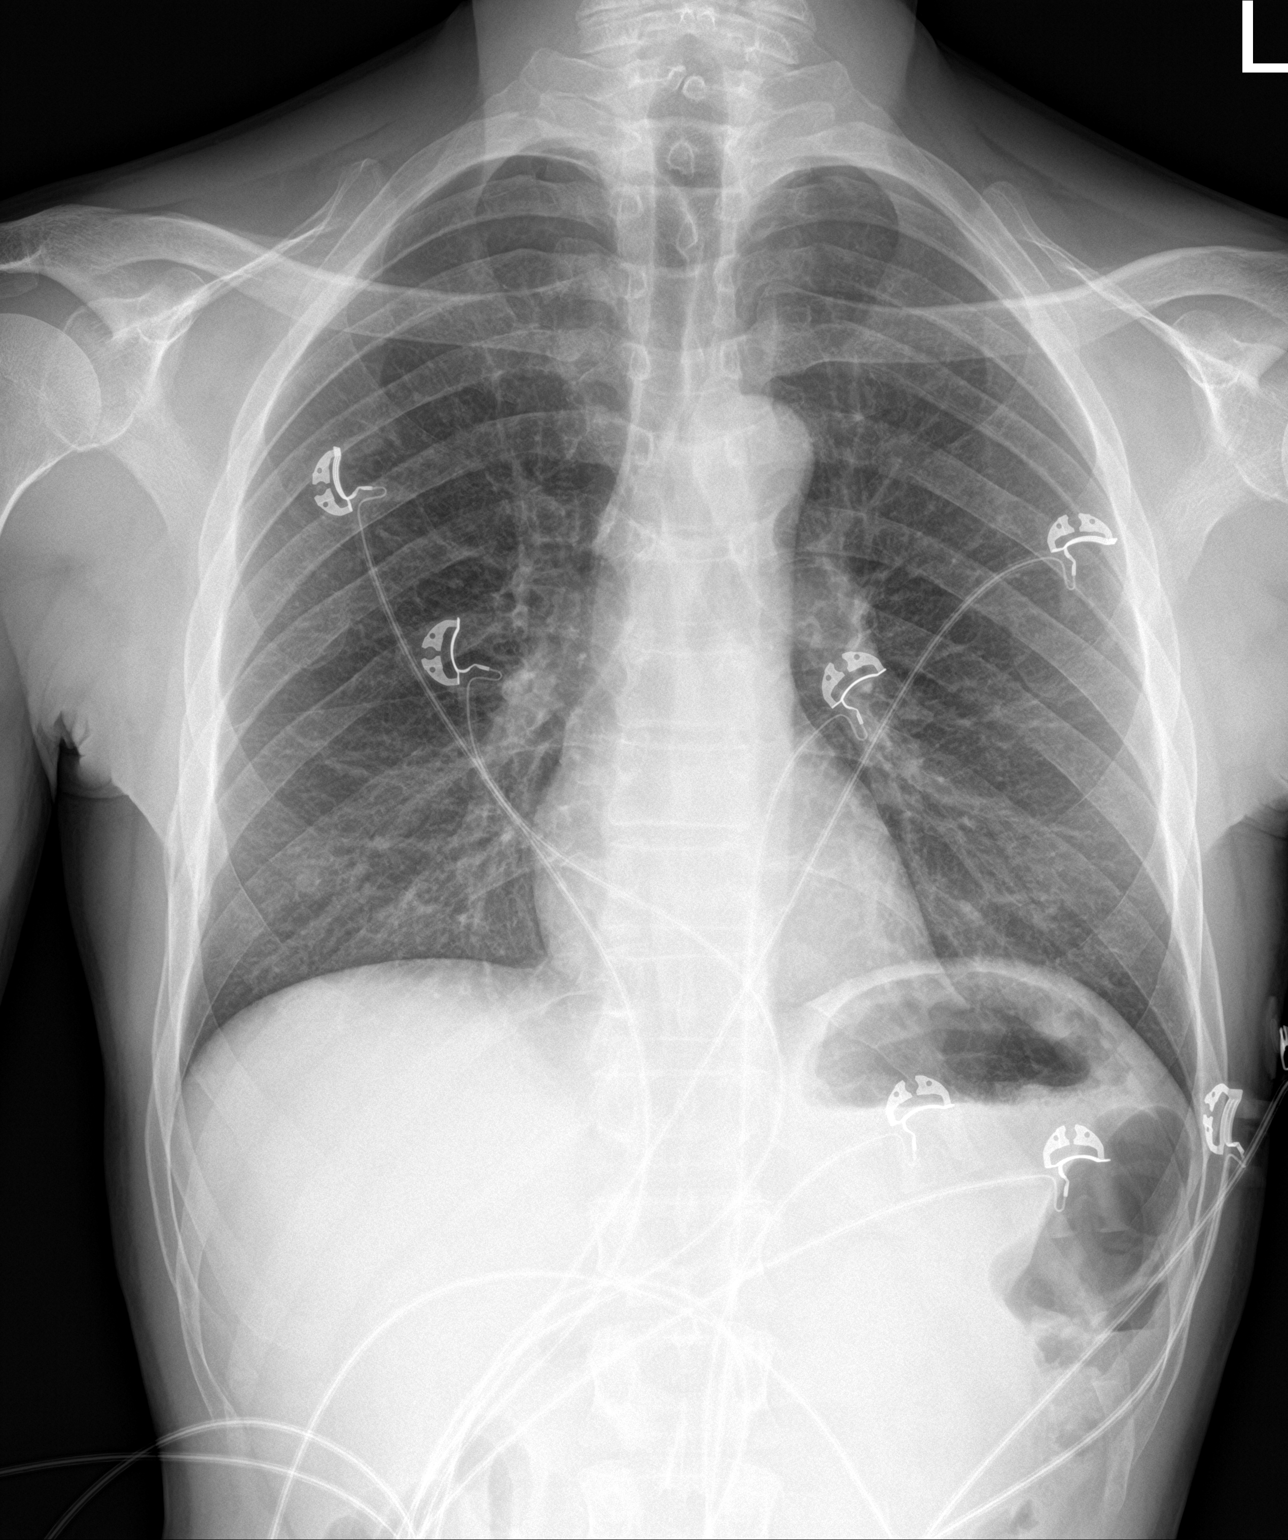

[chest lat]
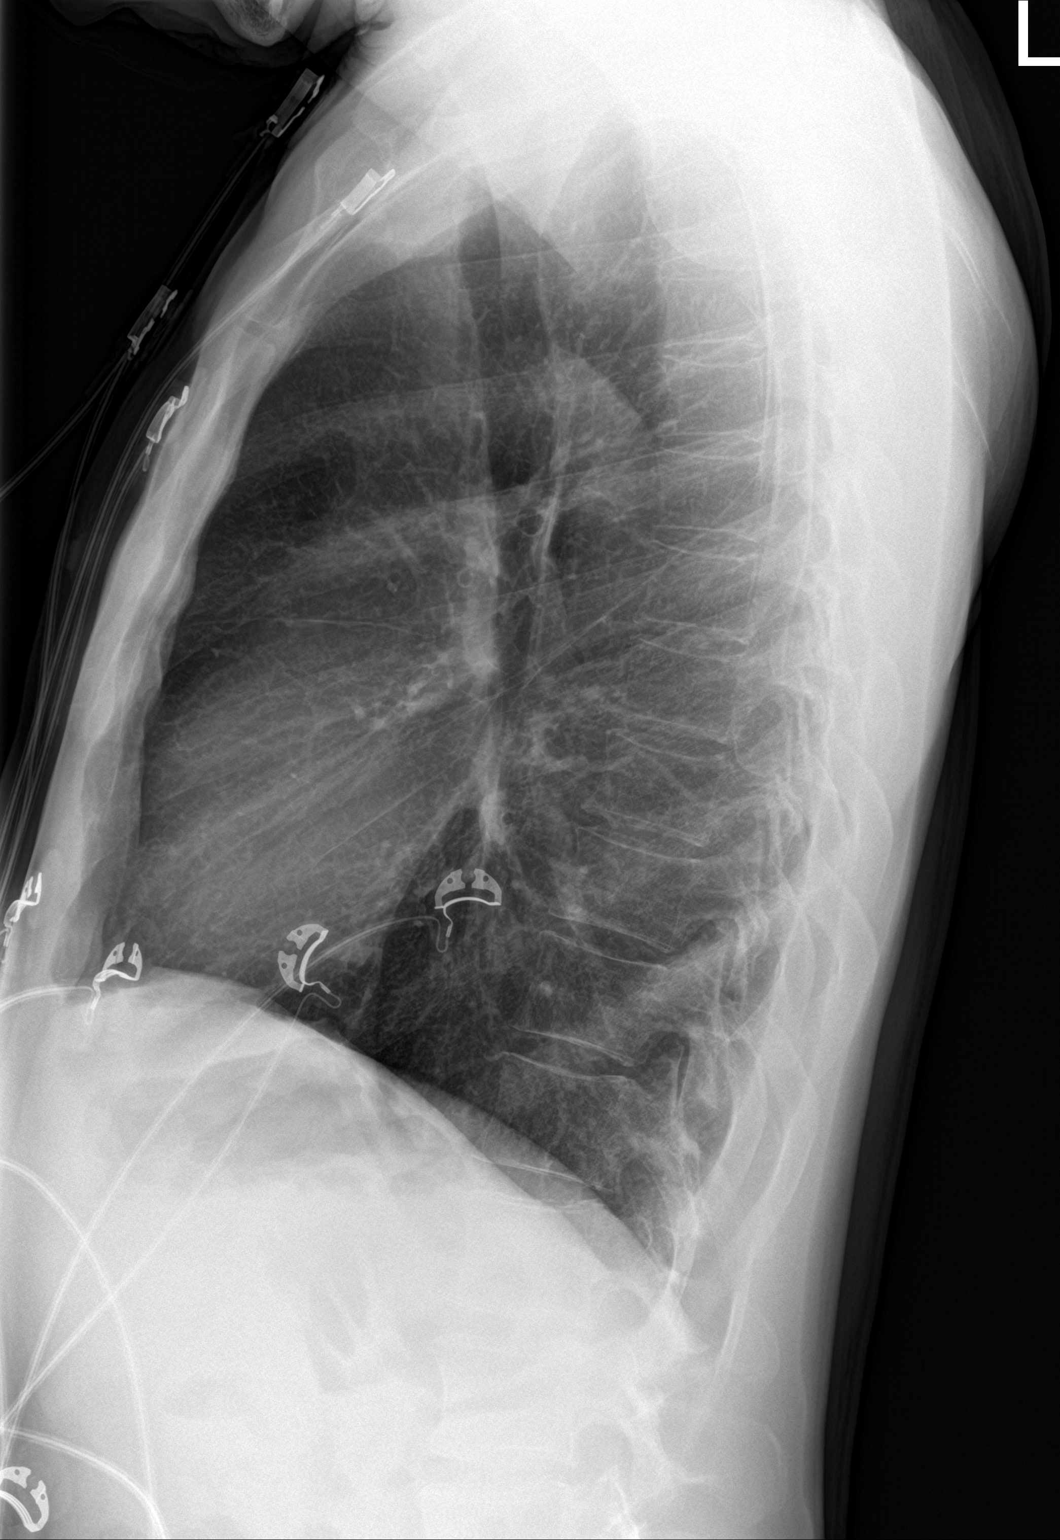

[2 of 2 positions shown; findings below may reference images not displayed]

FINDINGS: Lungs are clear.  No pleural effusion or pneumothorax.

Nodular opacity along the left lower lung on the prior study is not
evident on the current, possibly reflecting a nipple shadow. On the
current study, a right nipple shadow is evident.

The heart is normal in size.

Visualized osseous structures are within normal limits.
IMPRESSION: Normal chest radiographs.

## 2022-03-28 ENCOUNTER — Ambulatory Visit: Payer: Medicaid Other | Attending: Physician Assistant | Admitting: Physician Assistant

## 2022-03-28 IMAGING — DX DG CHEST 2V
2 series · 2 of 2 positions shown · non-contrast
Comparison: 09/28/2019 chest radiograph.

CLINICAL DATA: Chest pain

EXAM:
CHEST - 2 VIEW

[chest pa]
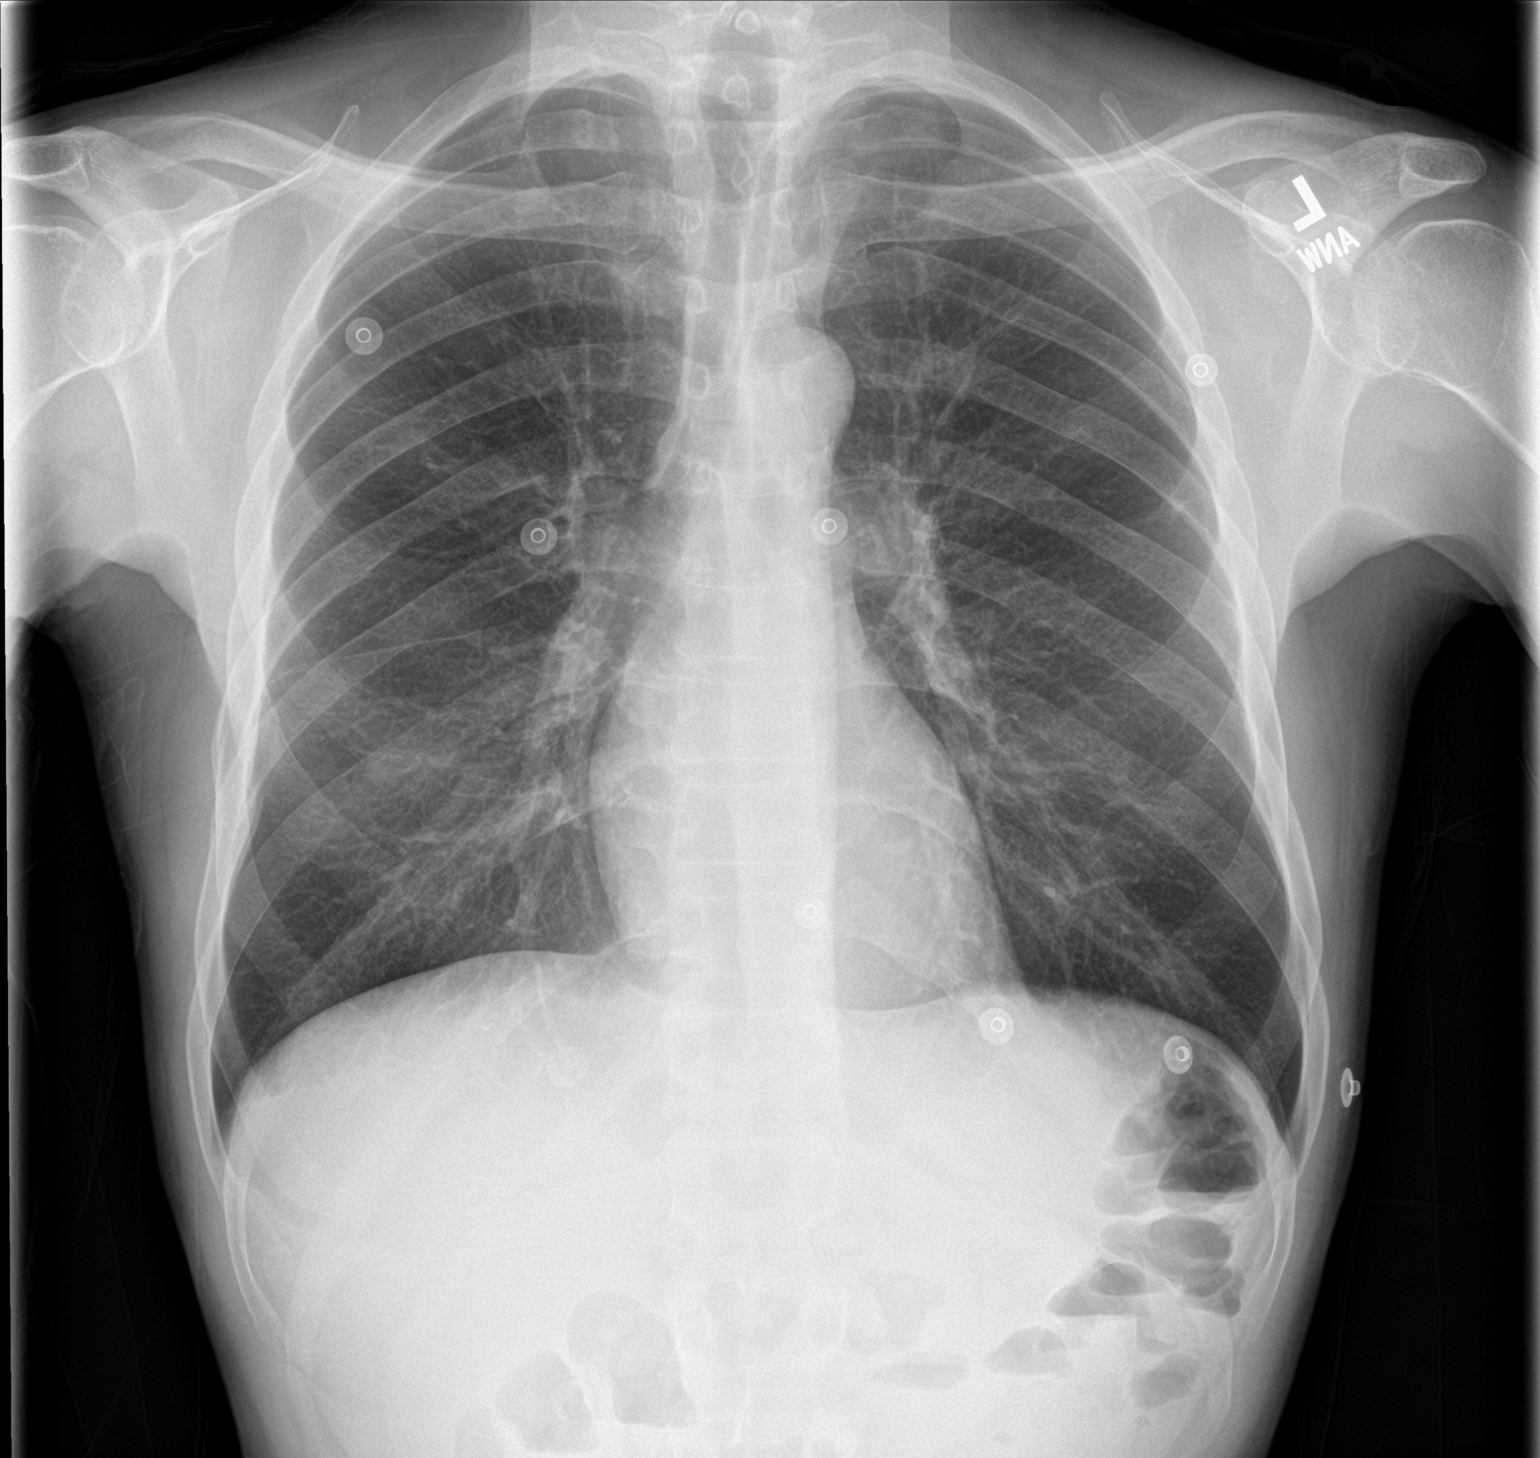

[chest lat]
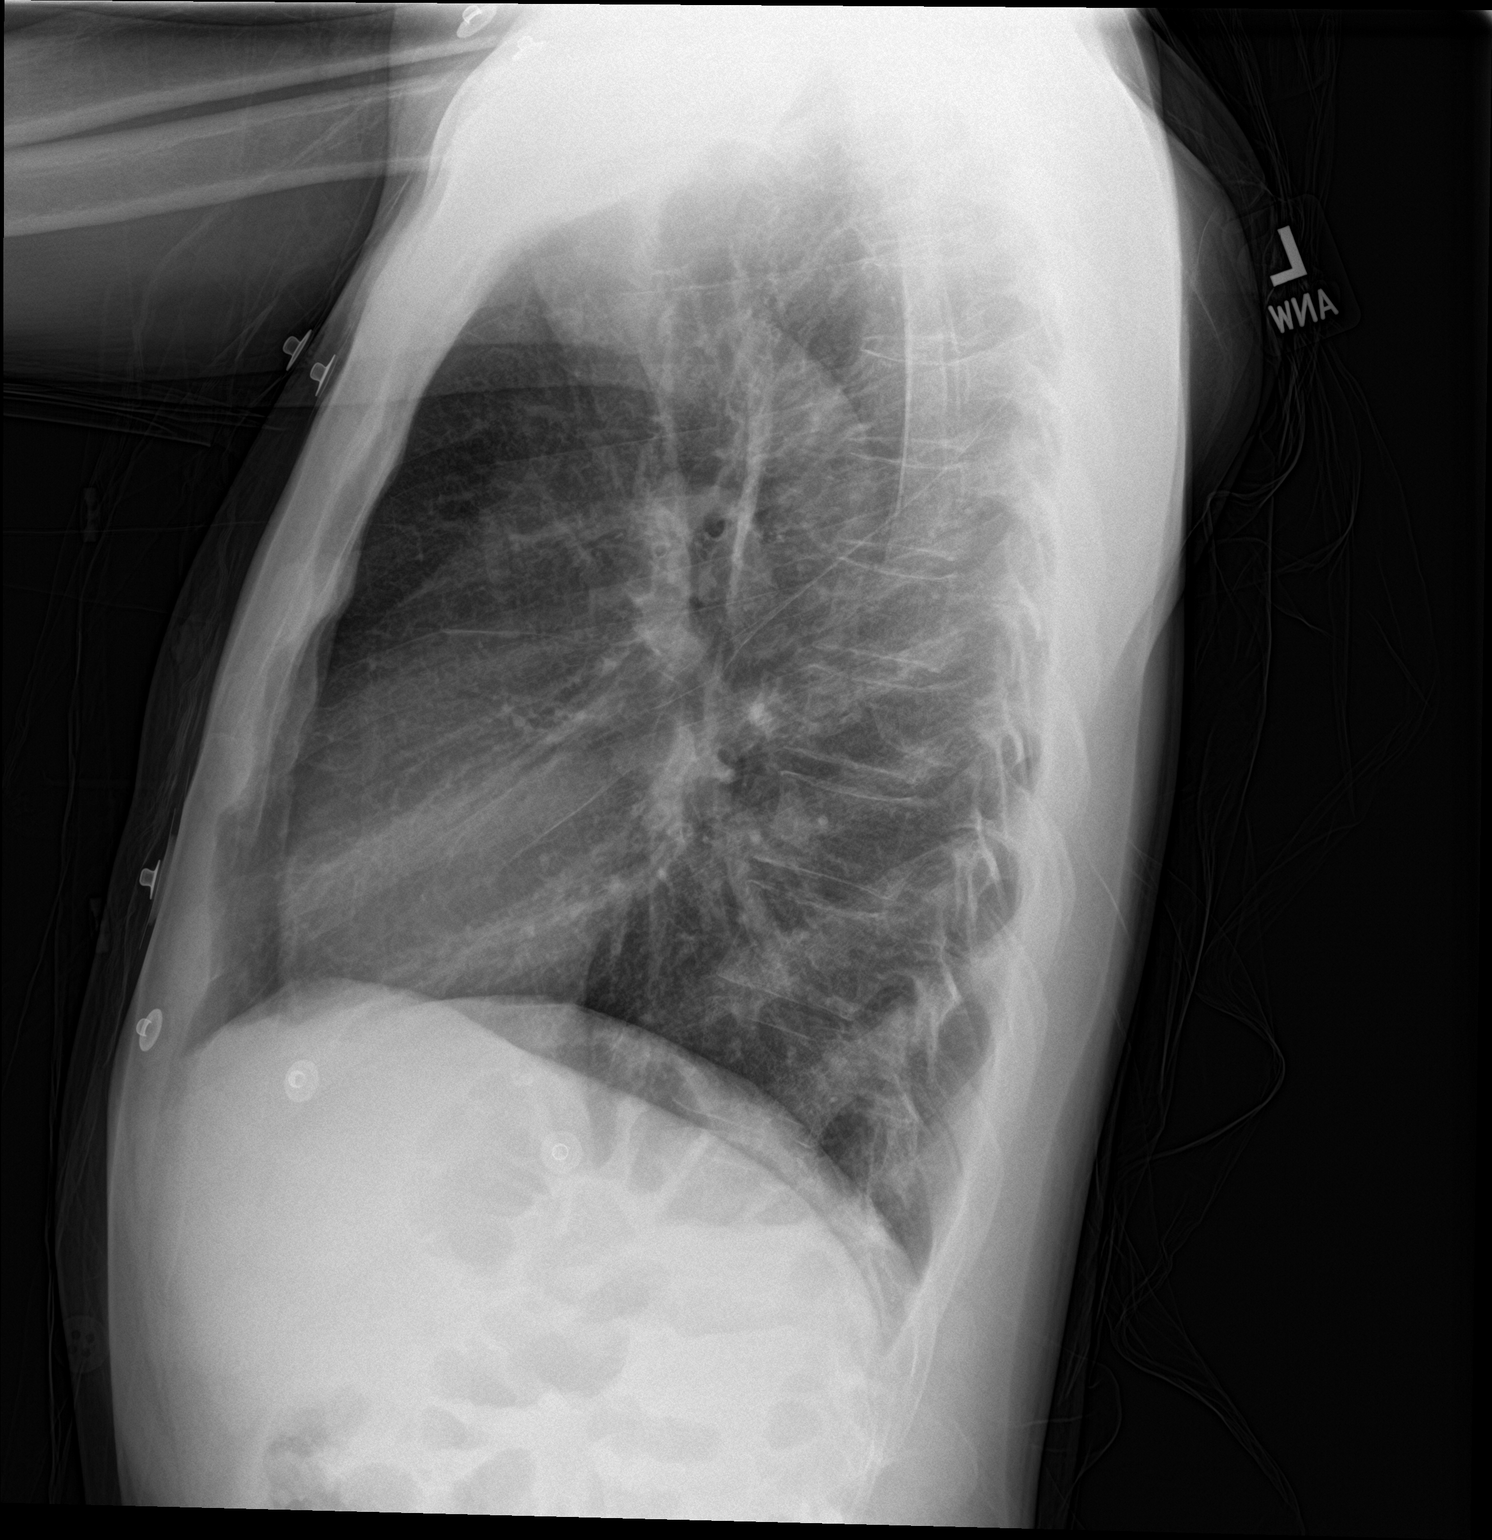

[2 of 2 positions shown; findings below may reference images not displayed]

FINDINGS: Stable cardiomediastinal silhouette with normal heart size. No
pneumothorax. No pleural effusion. Lungs appear clear, with no acute
consolidative airspace disease and no pulmonary edema.
IMPRESSION: No active cardiopulmonary disease.

## 2022-04-06 ENCOUNTER — Other Ambulatory Visit: Payer: Self-pay | Admitting: Gastroenterology

## 2022-04-14 ENCOUNTER — Encounter: Payer: Self-pay | Admitting: Physician Assistant

## 2022-04-15 ENCOUNTER — Telehealth: Payer: Self-pay | Admitting: Gastroenterology

## 2022-04-15 NOTE — Telephone Encounter (Signed)
Patient called back seeking advice if there is anyway he can get medication to help with his pain. Patient stated he is scheduled to see pain management on January 4th at 11:30 but is wanting to see if we can proscribe something until then. Please advise.

## 2022-04-15 NOTE — Telephone Encounter (Signed)
Called pharmacy.  Patient still has refills on both pantoprazole and Carafate suspension. Pharmacy will call patient and let him know.  Called patient but he did not answer the phone. LM on home phone that he has refills and go to pharmacy to put up more medication.  I reminded him that [name] does not write pain meds.  He should stop Bentyl if they are making him jumpy.  He should reach out to pain management.  We have set samples of Creon at front desk on 2nd floor for him.  Asked that he call back and let us know if he still needs them.

## 2022-04-15 NOTE — Telephone Encounter (Signed)
Inbound call from patient requesting refill on Protonix and Carafate. He is also requesting something for pain management, states the bentyl has made him "jumpy". Please advise

## 2022-04-22 ENCOUNTER — Other Ambulatory Visit (HOSPITAL_COMMUNITY): Payer: Self-pay

## 2022-04-22 ENCOUNTER — Telehealth: Payer: Self-pay | Admitting: Cardiology

## 2022-04-22 NOTE — Telephone Encounter (Signed)
Patient states while he was admitted in the hospital his [employer]io monitor was taken off of him and he assumes it was discarded. The company contacted him to inform him that they have not received it back. He wanted to inform Dr. Percival Spanish of this and states he plans to call the company to let them know what happened as well. He also mentions that he missed 11/20 appointment with Fabian Sharp, PA due to having COVID and rescheduled for 2/06.

## 2022-04-22 NOTE — Telephone Encounter (Signed)
Returned call to patient who states that he had the Zio monitor on when he was in the hospital but reports that it came off when they removed his EKG leads. Patient reports the monitor was left at the hospital and he is unsure what happened to it after that. Patient states that he is going to call the monitor company to let them know what happened as well. Advised patient I would forward to our monitors department to see if any recommendations. Patient verbalized understanding.

## 2022-05-03 NOTE — Telephone Encounter (Signed)
  Lets hold off on second monitor for now. I will see him and re-evaluate in Feb.  Thanks Angie   Returned call to patient and made him aware of the above- confirmed Feb apt with patient.

## 2022-05-09 IMAGING — CR DG CHEST 2V
2 series · 2 of 2 positions shown · non-contrast
Comparison: October 06, 2019

CLINICAL DATA: Chest pain.

EXAM:
CHEST - 2 VIEW

[chest pa]
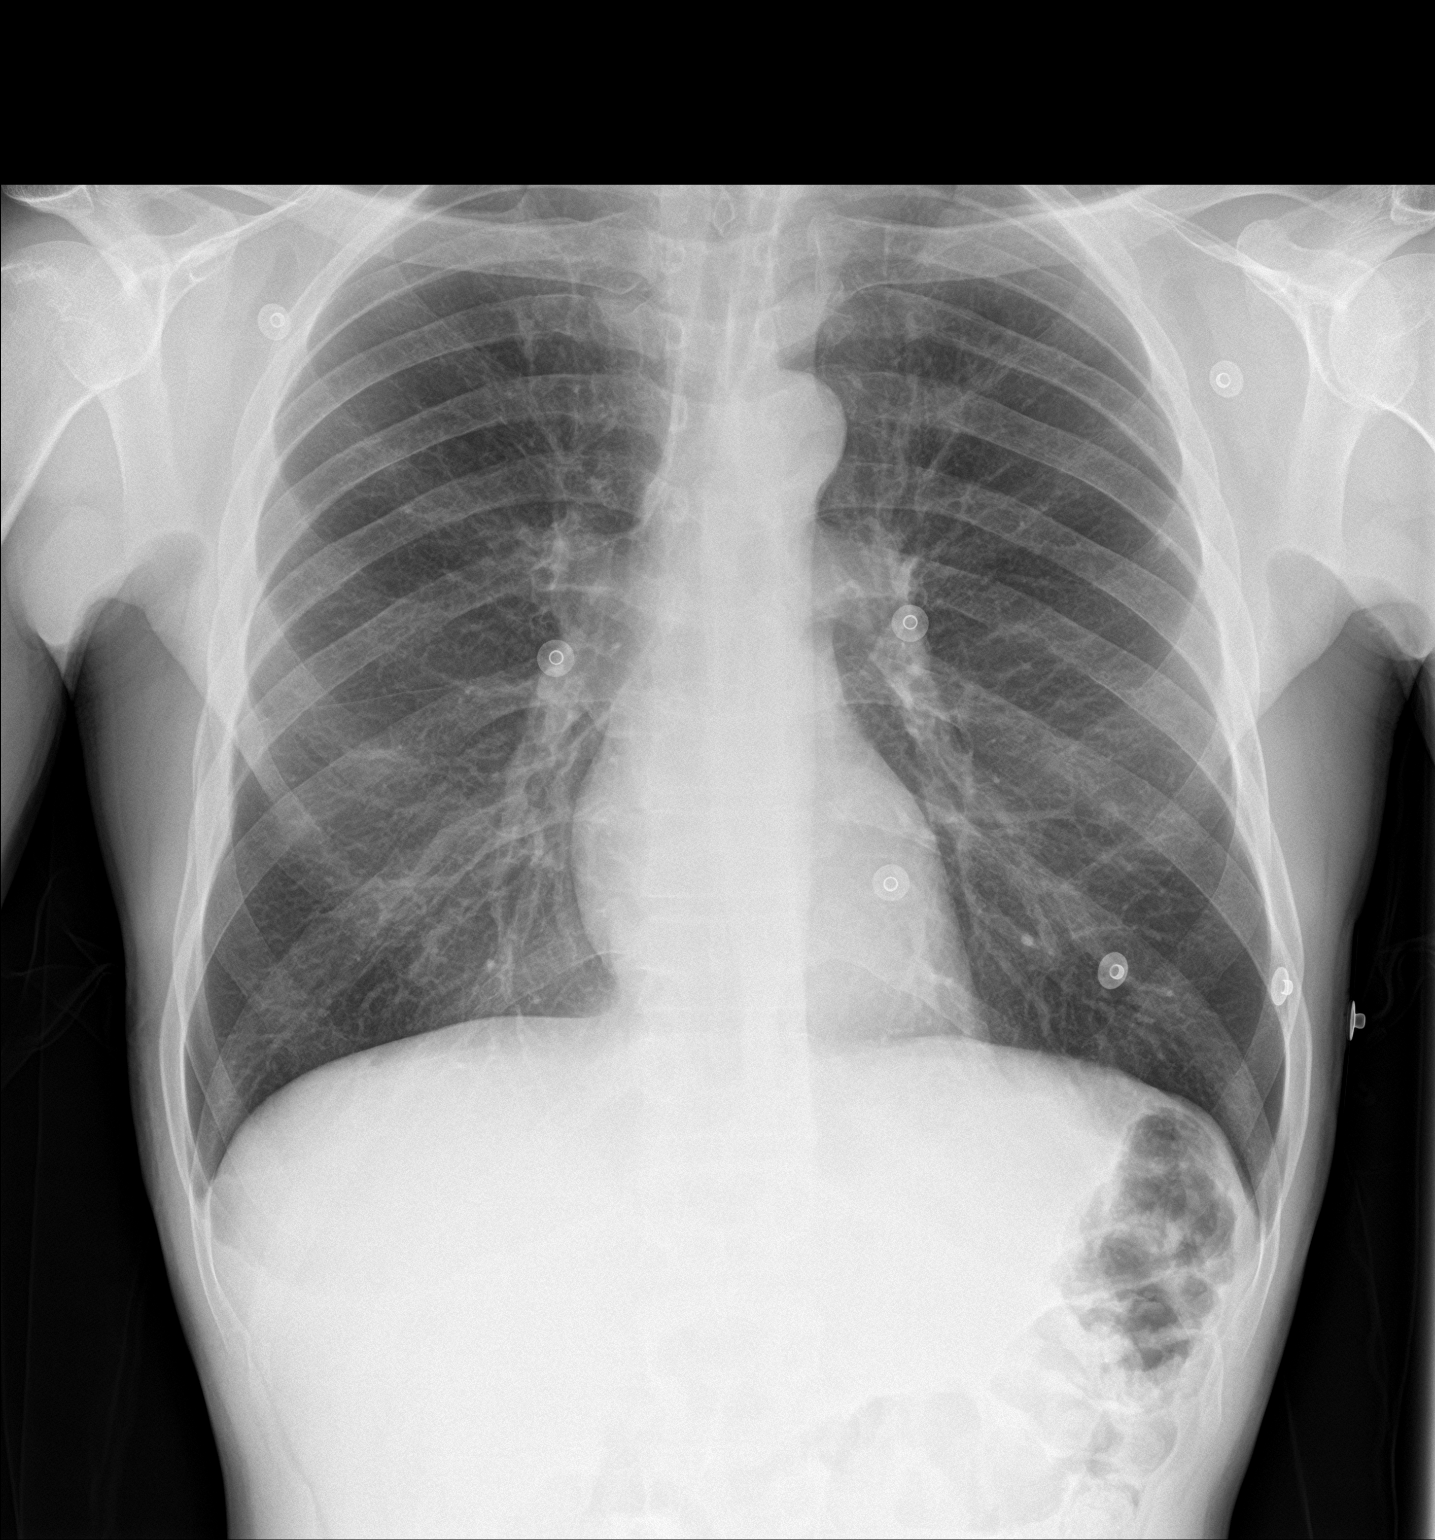

[chest lat]
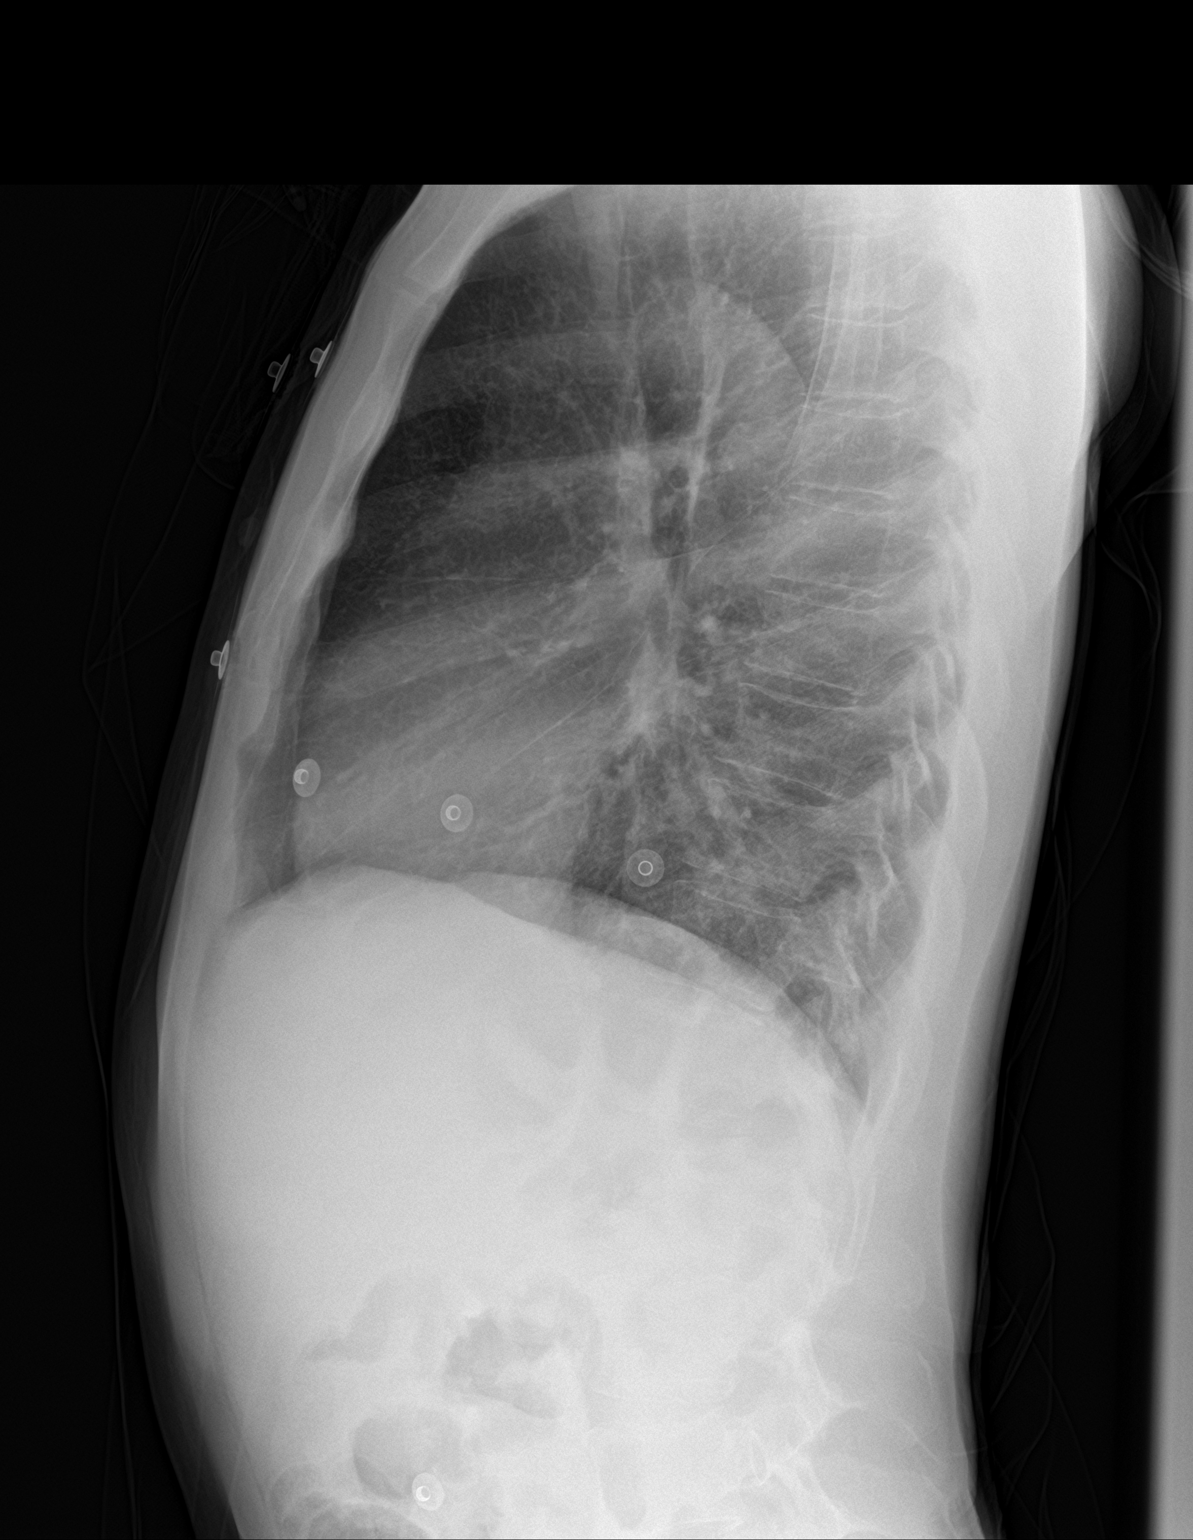

[2 of 2 positions shown; findings below may reference images not displayed]

FINDINGS: There is no evidence of acute infiltrate, pleural effusion or
pneumothorax. The heart size and mediastinal contours are within
normal limits. The visualized skeletal structures are unremarkable.
IMPRESSION: No active cardiopulmonary disease.

## 2022-05-14 IMAGING — MR MR ABDOMEN WO/W CM MRCP
18 of 20 series · 45 of 48 positions shown · IV contrast (gadavist)
Comparison: 08/08/2019 CT.

CLINICAL DATA: Weight loss of 24 pounds in 3 weeks. History of
peptic ulcer disease and chronic pancreatitis.

EXAM:
MRI ABDOMEN WITHOUT AND WITH CONTRAST (INCLUDING MRCP)
TECHNIQUE: Multiplanar multisequence MR imaging of the abdomen was performed
both before and after the administration of intravenous contrast.
Heavily T2-weighted images of the biliary and pancreatic ducts were
obtained, and three-dimensional MRCP images were rendered by post
processing.
CONTRAST:  5mL GADAVIST GADOBUTROL 1 MMOL/ML IV SOLN

[Series 3: T2 fat-sat · axial · 6.0mm · 1.06mm/px · z∈[-172,+80]mm · 2 of 36 slices shown]
[im 1/36]
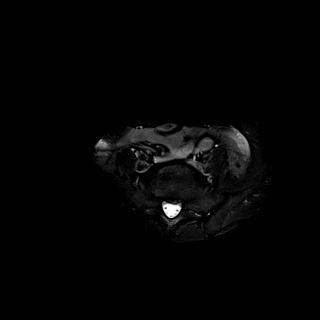
[im 36/36]
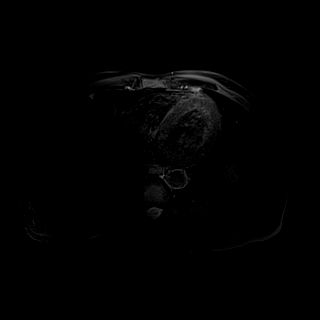

[Series 6: T2 · coronal · 6.0mm · 1.48mm/px · 1 of 28 slices shown (1 of 2)]
[im 1/28]
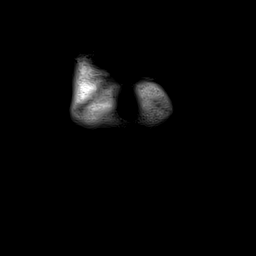

[Series 8: DWI · axial · 6.0mm · 1.49mm/px · z∈[-189,+62]mm · 3 of 70 slices shown (1 of 2)]
[im 1/70]
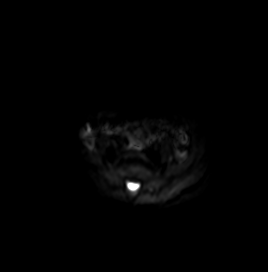
[im 35/70]
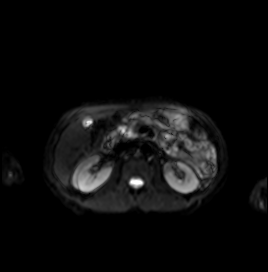
[im 70/70]
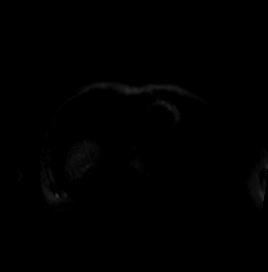

[Series 9: DWI · axial · 6.0mm · 1.49mm/px · 1 of 36 slices shown (2 of 2)]
[im 1/36]
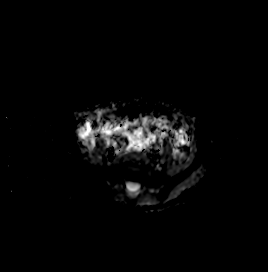

[Series 10: T1 · axial · 3.0mm · 1.25mm/px · z∈[-175,+37]mm · 3 of 72 slices shown (1 of 2)]
[im 1/72]
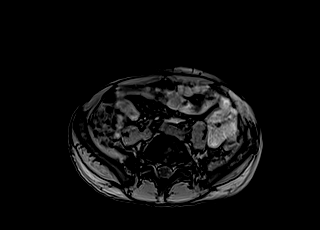
[im 36/72]
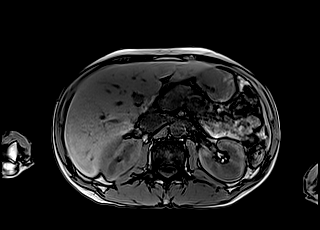
[im 72/72]
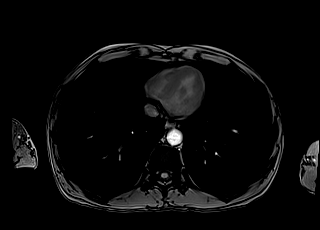

[Series 11: T1 · axial · 3.0mm · 1.25mm/px · z∈[-175,+37]mm · 3 of 72 slices shown (2 of 2)]
[im 1/72]
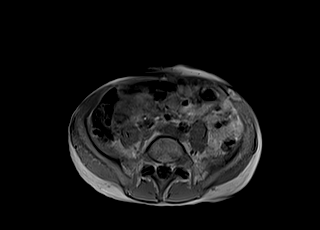
[im 36/72]
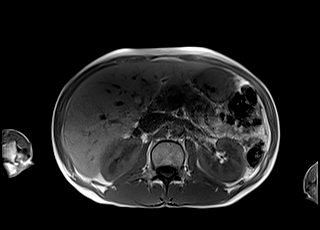
[im 72/72]
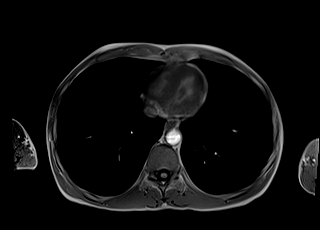

[Series 12: cor obl thk · sagittal · 50.0mm · 0.78mm/px · 1 of 9 slices shown]
[im 1/9]
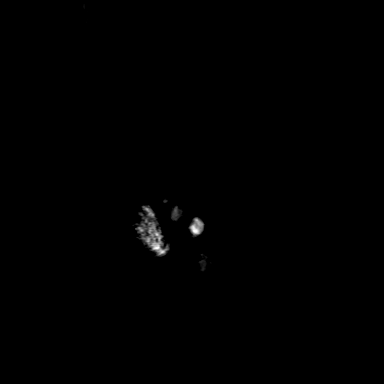

[Series 13: T2 · axial · 6.0mm · 1.33mm/px · 1 of 33 slices shown (2 of 2)]
[im 1/33]
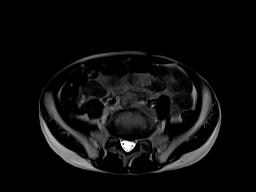

[Series 15: T1 dynamic · axial · 3.0mm · 1.06mm/px · z∈[-181,+56]mm · 3 of 80 slices shown (1 of 6)]
[im 1/80]
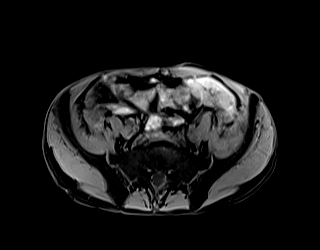
[im 40/80]
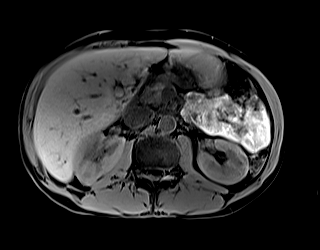
[im 80/80]
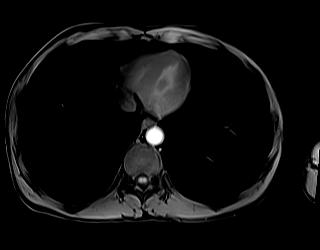

[Series 18: T1 dynamic · axial · 3.0mm · 1.06mm/px · z∈[-181,+56]mm · 3 of 80 slices shown (2 of 6)]
[im 1/80]
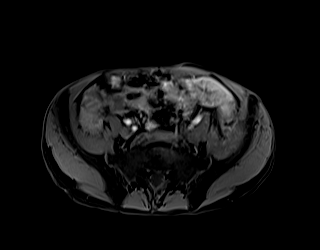
[im 40/80]
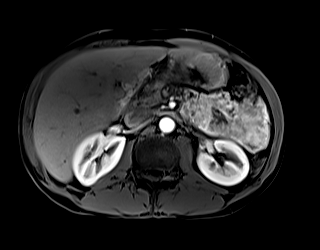
[im 80/80]
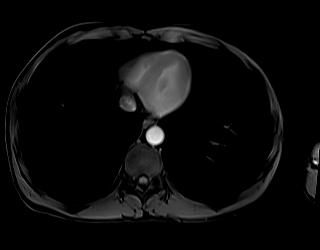

[Series 20: T1 dynamic · axial · 3.0mm · 1.06mm/px · z∈[-181,+56]mm · 3 of 80 slices shown (3 of 6)]
[im 1/80]
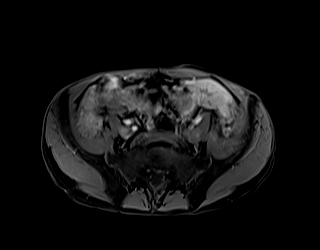
[im 40/80]
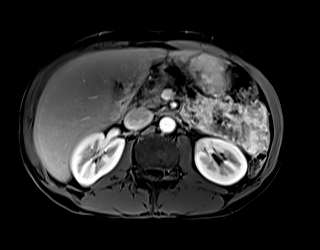
[im 80/80]
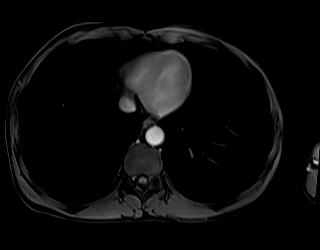

[Series 22: T1 dynamic · axial · 3.0mm · 1.06mm/px · z∈[-181,+56]mm · 3 of 80 slices shown (4 of 6)]
[im 1/80]
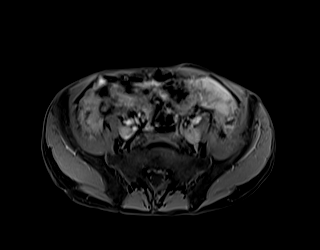
[im 40/80]
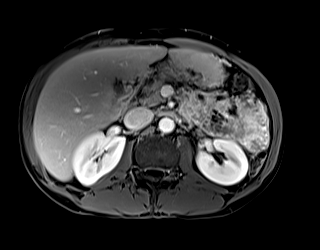
[im 80/80]
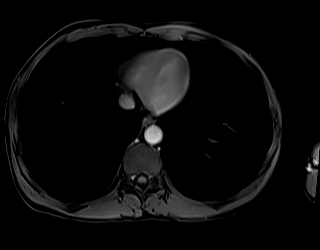

[Series 24: T1 dynamic · coronal · 3.0mm · 1.41mm/px · 3 of 72 slices shown (5 of 6)]
[im 1/72]
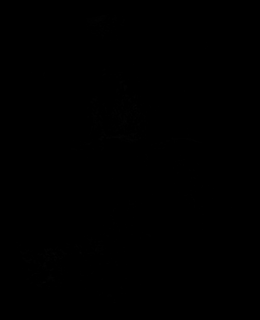
[im 36/72]
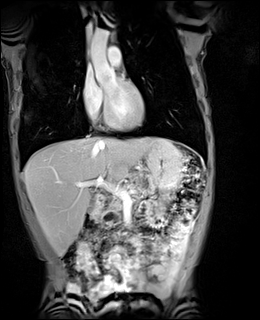
[im 72/72]
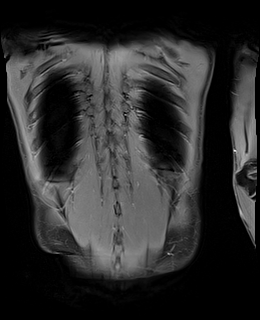

[Series 26: T1 dynamic · axial · 3.0mm · 1.06mm/px · z∈[-181,+56]mm · 3 of 80 slices shown (6 of 6)]
[im 1/80]
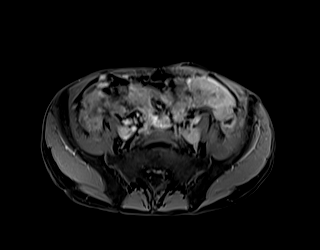
[im 40/80]
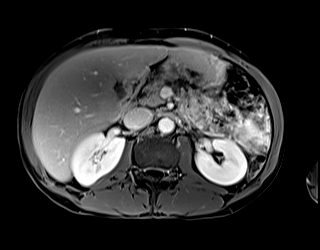
[im 80/80]
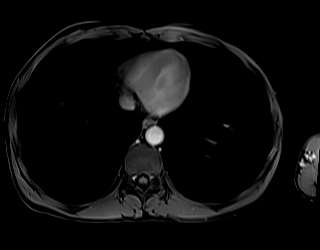

[Series 101: sub 20 sec · axial · 3.0mm · 1.06mm/px · z∈[-181,+56]mm · 3 of 80 slices shown]
[im 1/80]
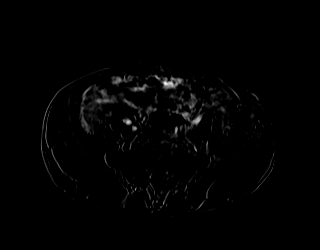
[im 40/80]
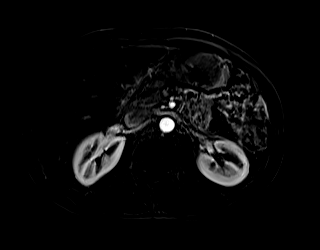
[im 80/80]
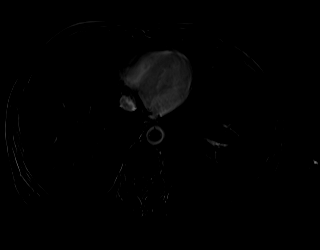

[Series 102: sub 45 sec · axial · 3.0mm · 1.06mm/px · z∈[-181,+56]mm · 3 of 80 slices shown]
[im 1/80]
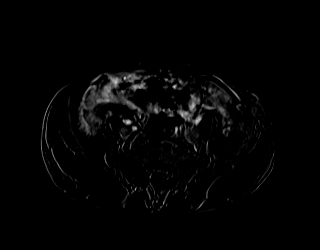
[im 40/80]
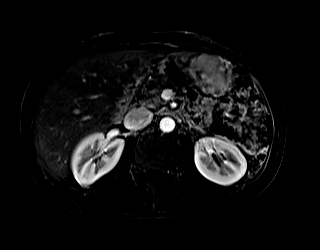
[im 80/80]
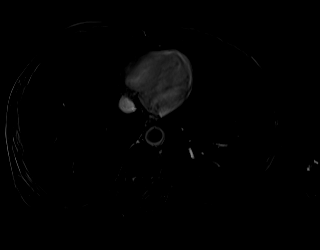

[Series 103: sub 90 sec · axial · 3.0mm · 1.06mm/px · z∈[-181,+56]mm · 3 of 80 slices shown]
[im 1/80]
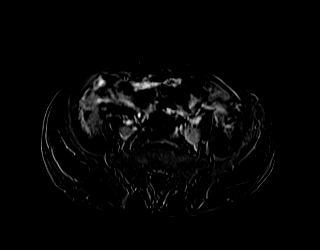
[im 40/80]
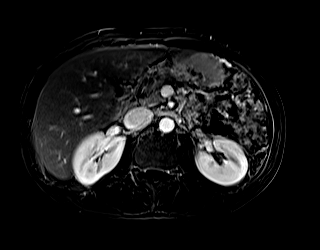
[im 80/80]
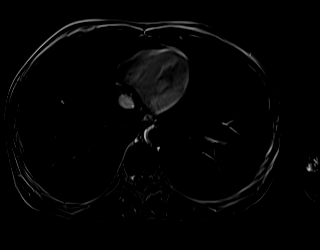

[Series 104: sub 3 min · axial · 3.0mm · 1.06mm/px · z∈[-181,+56]mm · 3 of 80 slices shown]
[im 1/80]
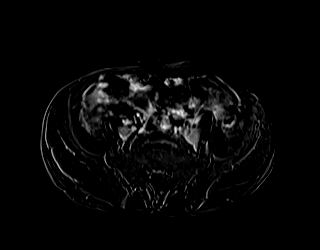
[im 40/80]
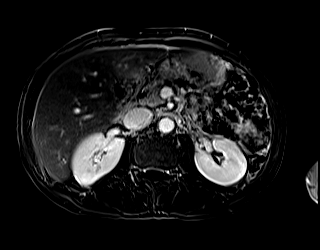
[im 80/80]
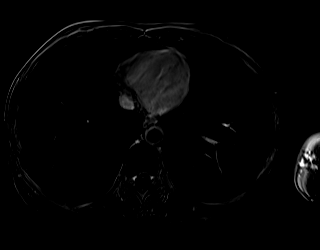

[45 of 48 positions shown; findings below may reference images not displayed]

FINDINGS: Lower chest: Normal heart size without pericardial or pleural
effusion.

Hepatobiliary: Normal liver. Normal gallbladder, without biliary
ductal dilatation.

Pancreas: Again identified are findings of chronic pancreatitis.
Cystic foci throughout the pancreas are likely a combination of
pseudocysts and/or areas of duct dilatation. A dominant pancreatic
body/tail junction cystic structure measures 2.1 cm on [DATE] and is
decreased from 3.2 cm when measured similarly on the 08/08/2019 CT.
Upstream to this, within the pancreatic tail, cystic structure
measures 1.0 cm on [DATE] versus 2.1 cm on the prior CT.

Smaller cystic structures are identified in the pancreatic neck and
head. These are not readily apparent on the prior CT of 08/08/2019,
possibly secondary to differences in technique. The pancreatic
uncinate process cystic lesion is dominant at 1.3 x 2.0 cm on [DATE].
No solid or enhancing nodularity within in the cystic foci. No acute
peripancreatic inflammation.

Spleen:  Normal in size, without focal abnormality.

Adrenals/Urinary Tract: Normal adrenal glands. Interpolar right
renal subcentimeter cyst. Normal left kidney, without
hydronephrosis.

Stomach/Bowel: The stomach is underdistended but appears thick
walled. Example greater curvature at 2.7 cm on [DATE]. This is similar
back on 12/17/2014. Colonic stool burden suggests constipation.
Normal small bowel caliber.

Vascular/Lymphatic: Aortic atherosclerosis. Patent portal splenic
veins. No retroperitoneal or retrocrural adenopathy.

Other:  No ascites.

Musculoskeletal: No acute osseous abnormality.
IMPRESSION: 1. Findings of chronic pancreatitis with decreased and new/increased
cystic lesions throughout compared to 08/08/2019 CT (given
difficulty in cross modality comparison). These are likely a
combination of foci of duct dilatation and intraparenchymal
pseudocysts. No evidence of acute pancreatitis.
2. No biliary duct dilatation.
3.  Possible constipation.
4. Chronic gastric wall thickening, again suspicious for gastritis.
5.  Aortic Atherosclerosis ([id number]).

## 2022-05-14 IMAGING — MR MR 3D RECON AT SCANNER
18 of 20 series · 45 of 48 positions shown · IV contrast (gadavist)
Comparison: 08/08/2019 CT.

CLINICAL DATA: Weight loss of 24 pounds in 3 weeks. History of
peptic ulcer disease and chronic pancreatitis.

EXAM:
MRI ABDOMEN WITHOUT AND WITH CONTRAST (INCLUDING MRCP)
TECHNIQUE: Multiplanar multisequence MR imaging of the abdomen was performed
both before and after the administration of intravenous contrast.
Heavily T2-weighted images of the biliary and pancreatic ducts were
obtained, and three-dimensional MRCP images were rendered by post
processing.
CONTRAST:  5mL GADAVIST GADOBUTROL 1 MMOL/ML IV SOLN

[Series 3: T2 fat-sat · axial · 6.0mm · 1.06mm/px · z∈[-172,+80]mm · 2 of 36 slices shown]
[im 1/36]
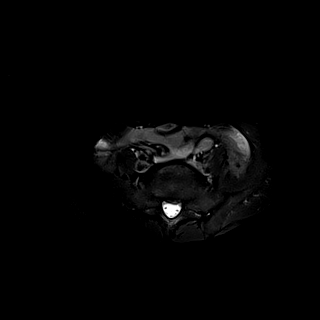
[im 36/36]
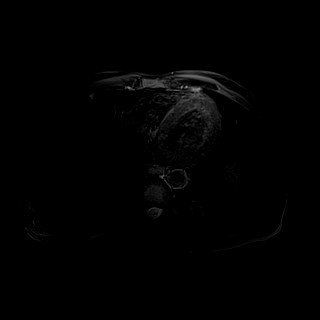

[Series 6: T2 · coronal · 6.0mm · 1.48mm/px · 1 of 28 slices shown (1 of 2)]
[im 1/28]
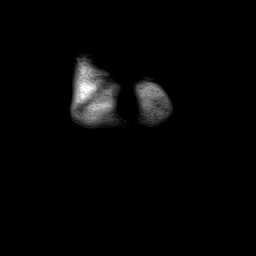

[Series 8: DWI · axial · 6.0mm · 1.49mm/px · z∈[-189,+62]mm · 3 of 70 slices shown (1 of 2)]
[im 1/70]
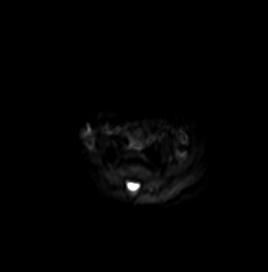
[im 35/70]
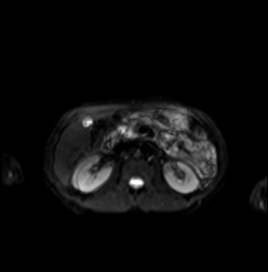
[im 70/70]
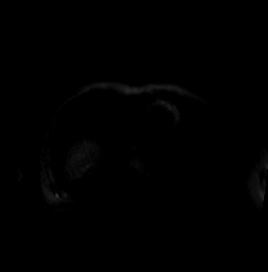

[Series 9: DWI · axial · 6.0mm · 1.49mm/px · 1 of 36 slices shown (2 of 2)]
[im 1/36]
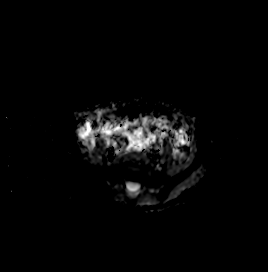

[Series 10: T1 · axial · 3.0mm · 1.25mm/px · z∈[-175,+37]mm · 3 of 72 slices shown (1 of 2)]
[im 1/72]
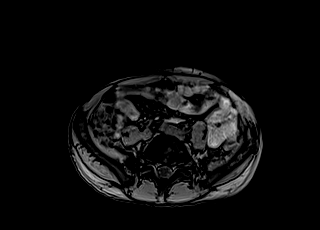
[im 36/72]
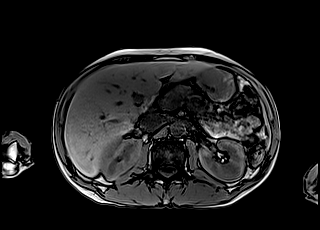
[im 72/72]
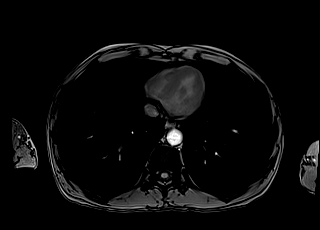

[Series 11: T1 · axial · 3.0mm · 1.25mm/px · z∈[-175,+37]mm · 3 of 72 slices shown (2 of 2)]
[im 1/72]
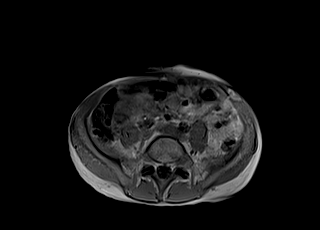
[im 36/72]
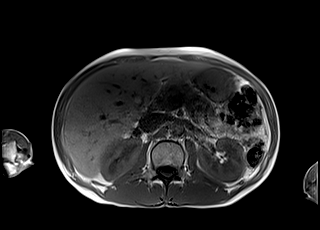
[im 72/72]
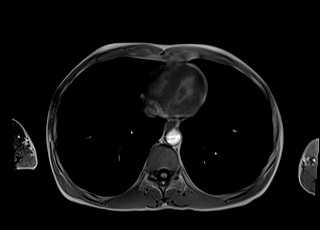

[Series 12: cor obl thk · sagittal · 50.0mm · 0.78mm/px · 1 of 9 slices shown]
[im 1/9]
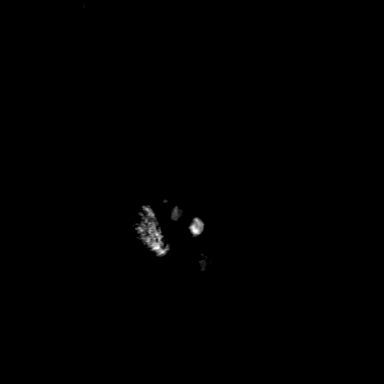

[Series 13: T2 · axial · 6.0mm · 1.33mm/px · 1 of 33 slices shown (2 of 2)]
[im 1/33]
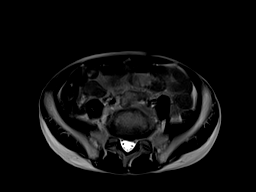

[Series 15: T1 dynamic · axial · 3.0mm · 1.06mm/px · z∈[-181,+56]mm · 3 of 80 slices shown (1 of 6)]
[im 1/80]
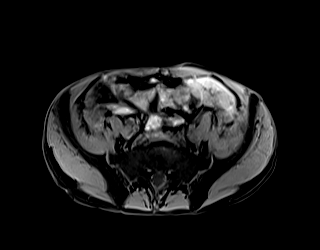
[im 40/80]
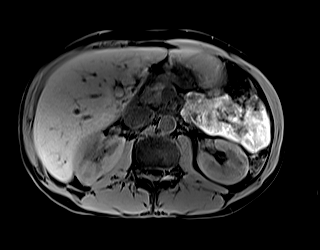
[im 80/80]
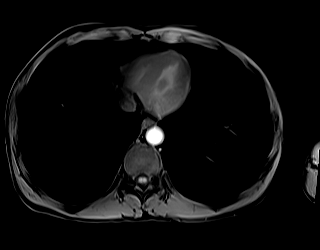

[Series 18: T1 dynamic · axial · 3.0mm · 1.06mm/px · z∈[-181,+56]mm · 3 of 80 slices shown (2 of 6)]
[im 1/80]
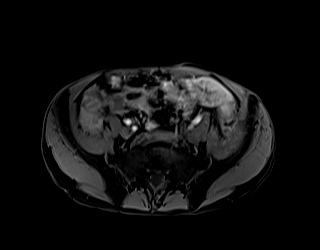
[im 40/80]
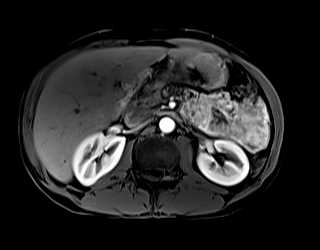
[im 80/80]
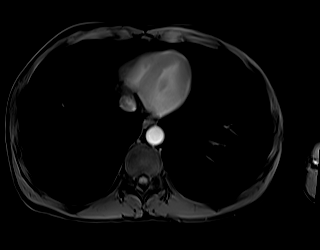

[Series 20: T1 dynamic · axial · 3.0mm · 1.06mm/px · z∈[-181,+56]mm · 3 of 80 slices shown (3 of 6)]
[im 1/80]
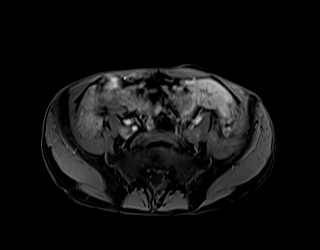
[im 40/80]
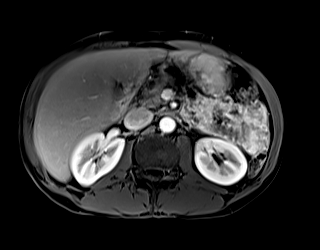
[im 80/80]
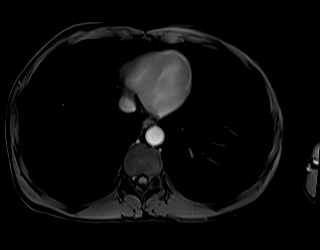

[Series 22: T1 dynamic · axial · 3.0mm · 1.06mm/px · z∈[-181,+56]mm · 3 of 80 slices shown (4 of 6)]
[im 1/80]
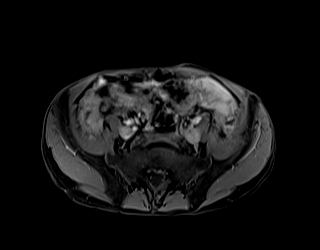
[im 40/80]
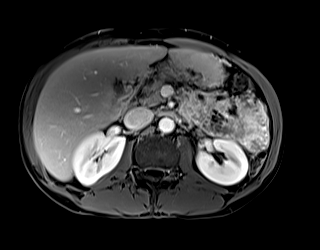
[im 80/80]
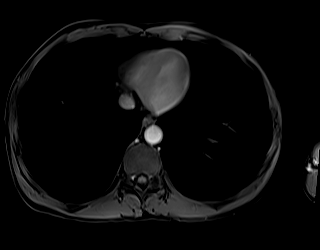

[Series 24: T1 dynamic · coronal · 3.0mm · 1.41mm/px · 3 of 72 slices shown (5 of 6)]
[im 1/72]
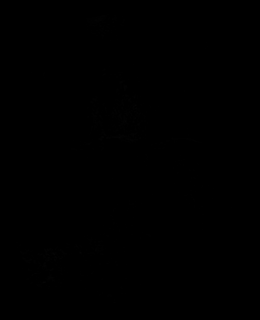
[im 36/72]
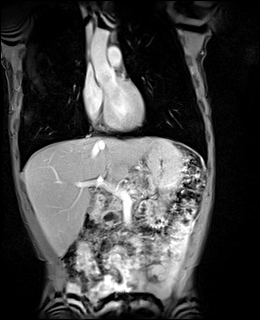
[im 72/72]
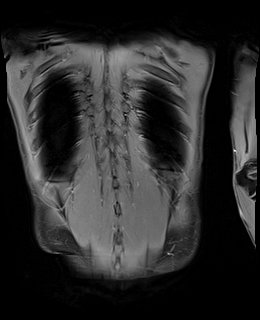

[Series 26: T1 dynamic · axial · 3.0mm · 1.06mm/px · z∈[-181,+56]mm · 3 of 80 slices shown (6 of 6)]
[im 1/80]
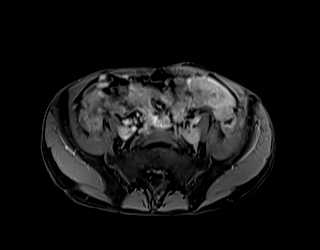
[im 40/80]
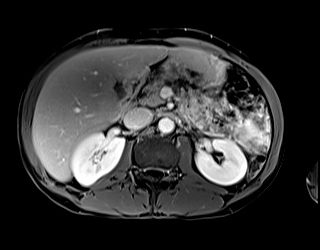
[im 80/80]
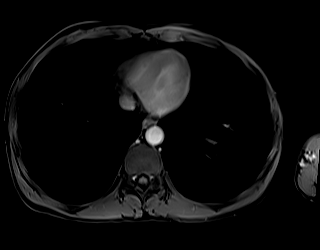

[Series 101: sub 20 sec · axial · 3.0mm · 1.06mm/px · z∈[-181,+56]mm · 3 of 80 slices shown]
[im 1/80]
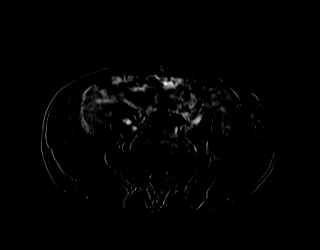
[im 40/80]
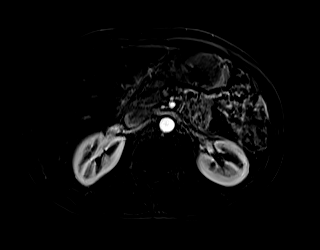
[im 80/80]
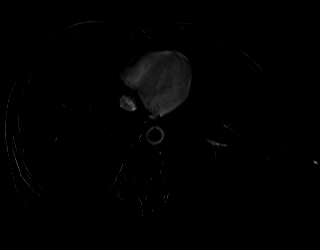

[Series 102: sub 45 sec · axial · 3.0mm · 1.06mm/px · z∈[-181,+56]mm · 3 of 80 slices shown]
[im 1/80]
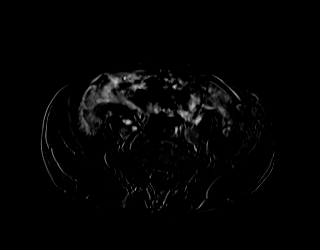
[im 40/80]
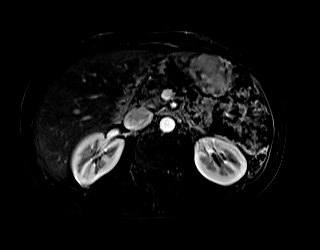
[im 80/80]
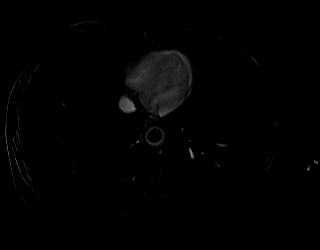

[Series 103: sub 90 sec · axial · 3.0mm · 1.06mm/px · z∈[-181,+56]mm · 3 of 80 slices shown]
[im 1/80]
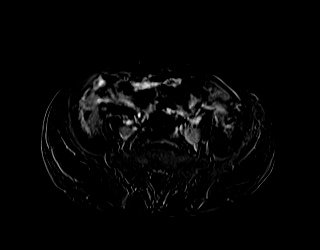
[im 40/80]
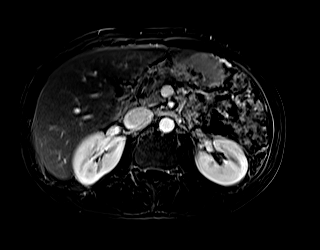
[im 80/80]
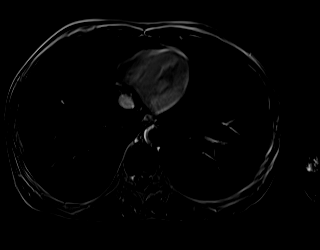

[Series 104: sub 3 min · axial · 3.0mm · 1.06mm/px · z∈[-181,+56]mm · 3 of 80 slices shown]
[im 1/80]
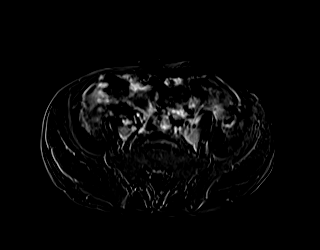
[im 40/80]
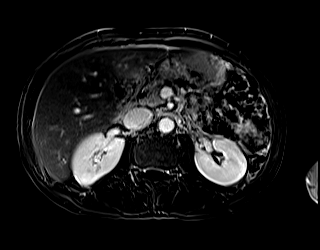
[im 80/80]
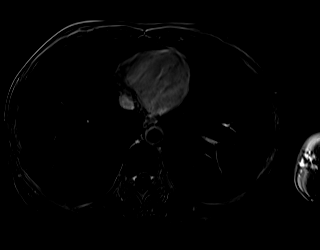

[45 of 48 positions shown; findings below may reference images not displayed]

FINDINGS: Lower chest: Normal heart size without pericardial or pleural
effusion.

Hepatobiliary: Normal liver. Normal gallbladder, without biliary
ductal dilatation.

Pancreas: Again identified are findings of chronic pancreatitis.
Cystic foci throughout the pancreas are likely a combination of
pseudocysts and/or areas of duct dilatation. A dominant pancreatic
body/tail junction cystic structure measures 2.1 cm on [DATE] and is
decreased from 3.2 cm when measured similarly on the 08/08/2019 CT.
Upstream to this, within the pancreatic tail, cystic structure
measures 1.0 cm on [DATE] versus 2.1 cm on the prior CT.

Smaller cystic structures are identified in the pancreatic neck and
head. These are not readily apparent on the prior CT of 08/08/2019,
possibly secondary to differences in technique. The pancreatic
uncinate process cystic lesion is dominant at 1.3 x 2.0 cm on [DATE].
No solid or enhancing nodularity within in the cystic foci. No acute
peripancreatic inflammation.

Spleen:  Normal in size, without focal abnormality.

Adrenals/Urinary Tract: Normal adrenal glands. Interpolar right
renal subcentimeter cyst. Normal left kidney, without
hydronephrosis.

Stomach/Bowel: The stomach is underdistended but appears thick
walled. Example greater curvature at 2.7 cm on [DATE]. This is similar
back on 12/17/2014. Colonic stool burden suggests constipation.
Normal small bowel caliber.

Vascular/Lymphatic: Aortic atherosclerosis. Patent portal splenic
veins. No retroperitoneal or retrocrural adenopathy.

Other:  No ascites.

Musculoskeletal: No acute osseous abnormality.
IMPRESSION: 1. Findings of chronic pancreatitis with decreased and new/increased
cystic lesions throughout compared to 08/08/2019 CT (given
difficulty in cross modality comparison). These are likely a
combination of foci of duct dilatation and intraparenchymal
pseudocysts. No evidence of acute pancreatitis.
2. No biliary duct dilatation.
3.  Possible constipation.
4. Chronic gastric wall thickening, again suspicious for gastritis.
5.  Aortic Atherosclerosis ([id number]).

## 2022-05-18 ENCOUNTER — Telehealth: Payer: Self-pay | Admitting: Gastroenterology

## 2022-05-18 MED ORDER — ONDANSETRON HCL 4 MG PO TABS: 4.0000 mg | ORAL_TABLET | Freq: Three times a day (TID) | ORAL | 0 refills | Status: DC | PRN

## 2022-05-18 MED ORDER — SUCRALFATE 1 GM/10ML PO SUSP: 1.0000 g | Freq: Four times a day (QID) | ORAL | 2 refills | Status: DC

## 2022-05-18 NOTE — Telephone Encounter (Signed)
Inbound call from patient requesting refills for Protonix, Zofran and liquid Carafate. Please advise.

## 2022-05-18 NOTE — Telephone Encounter (Signed)
Refills for liquid carafate and zofran sent to pharmacy. Note: patient is getting zofran from multiple providers. Last script was sent by Dr. Jeneen Rinks in October for #20 tablets with 0 refills. Patient is requesting refills of Protonix too soon as we sent #60 with 3 refills on November 7th which will take him to March.

## 2022-05-21 ENCOUNTER — Encounter (HOSPITAL_COMMUNITY): Payer: Self-pay

## 2022-05-21 ENCOUNTER — Ambulatory Visit (HOSPITAL_COMMUNITY): Admit: 2022-05-21 | Payer: Medicaid Other

## 2022-05-21 ENCOUNTER — Other Ambulatory Visit: Payer: Self-pay

## 2022-05-21 ENCOUNTER — Emergency Department (HOSPITAL_COMMUNITY)
Admission: EM | Admit: 2022-05-21 | Discharge: 2022-05-21 | Disposition: A | Discharge status: Home / Self Care | Payer: Medicaid Other | Attending: Emergency Medicine | Admitting: Emergency Medicine

## 2022-05-21 ENCOUNTER — Emergency Department (HOSPITAL_COMMUNITY): Payer: Medicaid Other

## 2022-05-21 ENCOUNTER — Ambulatory Visit: Payer: Medicaid Other | Attending: Orthopedic Surgery

## 2022-05-21 DIAGNOSIS — K2900 Acute gastritis without bleeding: Secondary | ICD-10-CM | POA: Insufficient documentation

## 2022-05-21 DIAGNOSIS — Y905 Blood alcohol level of 100-119 mg/100 ml: Secondary | ICD-10-CM | POA: Insufficient documentation

## 2022-05-21 DIAGNOSIS — M6281 Muscle weakness (generalized): Secondary | ICD-10-CM | POA: Insufficient documentation

## 2022-05-21 DIAGNOSIS — M545 Low back pain, unspecified: Secondary | ICD-10-CM | POA: Diagnosis not present

## 2022-05-21 DIAGNOSIS — Z91040 Latex allergy status: Secondary | ICD-10-CM | POA: Insufficient documentation

## 2022-05-21 DIAGNOSIS — M5459 Other low back pain: Secondary | ICD-10-CM | POA: Insufficient documentation

## 2022-05-21 DIAGNOSIS — G8929 Other chronic pain: Secondary | ICD-10-CM | POA: Diagnosis not present

## 2022-05-21 DIAGNOSIS — D649 Anemia, unspecified: Secondary | ICD-10-CM | POA: Diagnosis not present

## 2022-05-21 DIAGNOSIS — M5416 Radiculopathy, lumbar region: Secondary | ICD-10-CM | POA: Insufficient documentation

## 2022-05-21 DIAGNOSIS — Z79899 Other long term (current) drug therapy: Secondary | ICD-10-CM | POA: Insufficient documentation

## 2022-05-21 DIAGNOSIS — R780 Finding of alcohol in blood: Secondary | ICD-10-CM | POA: Insufficient documentation

## 2022-05-21 DIAGNOSIS — R1013 Epigastric pain: Secondary | ICD-10-CM | POA: Diagnosis present

## 2022-05-21 LAB — CBC
HCT: 34.8 % — ABNORMAL LOW (ref 39.0–52.0)
Hemoglobin: 11.5 g/dL — ABNORMAL LOW (ref 13.0–17.0)
MCH: 31.3 pg (ref 26.0–34.0)
MCHC: 33 g/dL (ref 30.0–36.0)
MCV: 94.8 fL (ref 80.0–100.0)
Platelets: 179 10*3/uL (ref 150–400)
RBC: 3.67 MIL/uL — ABNORMAL LOW (ref 4.22–5.81)
RDW: 16.3 % — ABNORMAL HIGH (ref 11.5–15.5)
WBC: 7.8 10*3/uL (ref 4.0–10.5)
nRBC: 0 % (ref 0.0–0.2)

## 2022-05-21 LAB — BASIC METABOLIC PANEL
Anion gap: 13 (ref 5–15)
BUN: 8 mg/dL (ref 6–20)
CO2: 15 mmol/L — ABNORMAL LOW (ref 22–32)
Calcium: 8.1 mg/dL — ABNORMAL LOW (ref 8.9–10.3)
Chloride: 108 mmol/L (ref 98–111)
Creatinine, Ser: 0.72 mg/dL (ref 0.61–1.24)
GFR, Estimated: >60 mL/min (ref >60)
Glucose, Bld: 137 mg/dL — ABNORMAL HIGH (ref 70–99)
Potassium: 4.5 mmol/L (ref 3.5–5.1)
Sodium: 136 mmol/L (ref 135–145)

## 2022-05-21 LAB — ETHANOL: Alcohol, Ethyl (B): 119 mg/dL — ABNORMAL HIGH (ref <10)

## 2022-05-21 LAB — TROPONIN I (HIGH SENSITIVITY): Troponin I (High Sensitivity): 6 ng/L (ref <18)

## 2022-05-21 LAB — LIPASE, BLOOD: Lipase: 34 U/L (ref 11–51)

## 2022-05-21 MED ORDER — LIDOCAINE VISCOUS HCL 2 % MT SOLN
15.0000 mL | Freq: Once | OROMUCOSAL | Status: AC
  Administered 2022-05-21: 15 mL via ORAL
  Filled 2022-05-21: qty 15

## 2022-05-21 MED ORDER — THIAMINE HCL 100 MG/ML IJ SOLN: 100.0000 mg | Freq: Every day | INTRAMUSCULAR | Status: DC

## 2022-05-21 MED ORDER — ACETAMINOPHEN 325 MG PO TABS
650.0000 mg | ORAL_TABLET | Freq: Once | ORAL | Status: AC
  Administered 2022-05-21: 650 mg via ORAL
  Filled 2022-05-21: qty 2

## 2022-05-21 MED ORDER — ADULT MULTIVITAMIN W/MINERALS CH
1.0000 | ORAL_TABLET | Freq: Every day | ORAL | Status: DC
  Administered 2022-05-21: 1 via ORAL
  Filled 2022-05-21: qty 1

## 2022-05-21 MED ORDER — ALUM & MAG HYDROXIDE-SIMETH 200-200-20 MG/5ML PO SUSP
30.0000 mL | Freq: Once | ORAL | Status: AC
  Administered 2022-05-21: 30 mL via ORAL
  Filled 2022-05-21: qty 30

## 2022-05-21 MED ORDER — LORAZEPAM 1 MG PO TABS: 1.0000 mg | ORAL_TABLET | ORAL | Status: DC | PRN

## 2022-05-21 MED ORDER — GADOBUTROL 1 MMOL/ML IV SOLN
5.0000 mL | Freq: Once | INTRAVENOUS | Status: AC | PRN
  Administered 2022-05-21: 5 mL via INTRAVENOUS

## 2022-05-21 MED ORDER — FOLIC ACID 1 MG PO TABS
1.0000 mg | ORAL_TABLET | Freq: Every day | ORAL | Status: DC
  Administered 2022-05-21: 1 mg via ORAL
  Filled 2022-05-21: qty 1

## 2022-05-21 MED ORDER — THIAMINE MONONITRATE 100 MG PO TABS
100.0000 mg | ORAL_TABLET | Freq: Every day | ORAL | Status: DC
  Administered 2022-05-21: 100 mg via ORAL
  Filled 2022-05-21: qty 1

## 2022-05-21 MED ORDER — LORAZEPAM 1 MG PO TABS: 0.5000 mg | ORAL_TABLET | ORAL | Status: DC | PRN

## 2022-05-21 NOTE — Discharge Instructions (Addendum)
Your mri showed mild bulging discs but nothing that would cause any of the symptoms you were sent in for evaluation for.. You should stop drinking alcohol while you are taking prednisone as this can irritate your stomach significantly. Over-the-counter Pepcid to reduce your abdominal pain you may use Tylenol as well. Open your primary care doctor

## 2022-05-21 NOTE — ED Provider Notes (Signed)
[facility] EMERGENCY DEPARTMENT Provider Note   CSN: [id number] Arrival date & time: 05/21/22  1144     History  Chief Complaint  Patient presents with   Abdominal Pain   Chest Pain    [name] is a 52 y.o. male who is well-known to this emergency department.  He presents with 2 complaints.  First the patient was sent to the emergency department because he is complaining of weakness in his legs.  He was at PT for his chronic back pain.  He told him that he is having difficulty urinating and defecating and has been wearing depends.  He denies saddle anesthesia or loss of control of his bowel or bladder.  He states that he feels like his legs go weak sometimes.  Patient is also currently taking prednisone and was told that this may irritate his stomach.  He has a history of chronic abdominal pain and pancreatitis.  He arrives with a blood alcohol level of 119.  Patient states that he ate fried chicken and macaroni last night along with alcohol.  He is complaining of epigastric abdominal pain that refluxes up into his chest.  He denies active vomiting.   Abdominal Pain Associated symptoms: chest pain   Chest Pain Associated symptoms: abdominal pain        Home Medications Prior to Admission medications   Medication Sig Start Date End Date Taking? Authorizing Provider  acetaminophen (TYLENOL) 500 MG tablet Take 1,000 mg by mouth every 6 (six) hours as needed for moderate pain or headache.    [provider]  acetaminophen (TYLENOL) 650 MG CR tablet Take 1 tablet (650 mg total) by mouth every 8 (eight) hours as needed for pain. 03/05/22   Ellsworth Lennox, PA-C  albuterol (VENTOLIN HFA) 108 (90 Base) MCG/ACT inhaler Inhale 2 puffs into the lungs every 4 (four) hours as needed for wheezing or shortness of breath. 08/17/21   Leroy Sea, MD  amLODipine (NORVASC) 10 MG tablet Take 1 tablet (10 mg total) by mouth daily. 12/25/21   Leroy Sea,  MD  benzonatate (TESSALON) 100 MG capsule Take 1 capsule (100 mg total) by mouth 3 (three) times daily as needed for cough. 03/14/22   Long, Arlyss Repress, MD  dextromethorphan-guaiFENesin [facility]) 30-600 MG 12hr tablet Take 1 tablet by mouth 2 (two) times daily. 03/05/22   Ellsworth Lennox, PA-C  dicyclomine (BENTYL) 10 MG capsule Take 1 capsule (10 mg total) by mouth 2 (two) times daily. 03/15/22   [name], Willaim Rayas, MD  DULoxetine (CYMBALTA) 30 MG capsule Take 2 capsules (60 mg total) by mouth at bedtime. Take 1 capsule daily at bedtime for 2 weeks and then increase to 2 capsules at bedtime 04/06/22   [name], Willaim Rayas, MD  folic acid (FOLVITE) 1 MG tablet Take 1 tablet (1 mg total) by mouth daily. 11/26/17   Leroy Sea, MD  HYDROcodone-acetaminophen (NORCO/VICODIN) 5-325 MG tablet Take 1 tablet by mouth every 12 (twelve) hours as needed for moderate pain. 12/24/21   Leroy Sea, MD  lidocaine (LMX) 4 % cream Apply topically 4 (four) times daily as needed (NGT pain). 12/24/21   Leroy Sea, MD  lipase/protease/amylase (CREON) 36000 UNITS CPEP capsule Take 2-3 capsules (72,000-108,000 Units total) by mouth 3 (three) times daily with meals. 03/15/22   [name], Willaim Rayas, MD  loperamide (IMODIUM) 2 MG capsule Take 1 capsule (2 mg total) by mouth 4 (four) times daily as needed for diarrhea  or loose stools. 02/17/22   Tegeler, Canary Brim, MD  magnesium oxide (MAG-OX) 400 (240 Mg) MG tablet Take 1 tablet (400 mg total) by mouth daily. 11/30/21   Benjiman Core, MD  metoprolol succinate (TOPROL XL) 25 MG 24 hr tablet Take 1 tablet (25 mg total) by mouth as needed (for palpitations). To not exceed 1 dose per day. Patient taking differently: Take 50 mg by mouth daily. 01/06/22   Duke, Roe Rutherford, PA  metoprolol succinate (TOPROL-XL) 200 MG 24 hr tablet Take 1 tablet (200 mg total) by mouth daily. Take with or immediately following a meal. 02/14/22   Rollene Rotunda, MD  Multiple Vitamin  (MULTIVITAMIN WITH MINERALS) TABS tablet Take 1 tablet by mouth daily. 09/06/17   Barnetta Chapel, MD  nicotine (NICODERM CQ - DOSED IN MG/24 HOURS) 21 mg/24hr patch Place 1 patch (21 mg total) onto the skin at bedtime. 12/24/21   Leroy Sea, MD  nicotine polacrilex (NICORETTE STARTER KIT) 4 MG gum Take 1 each (4 mg total) by mouth as needed for smoking cessation. 03/15/22   [name], Willaim Rayas, MD  nitroGLYCERIN (NITROSTAT) 0.4 MG SL tablet Place 1 tablet (0.4 mg total) under the tongue every 5 (five) minutes as needed for chest pain. Do not exceed 3 doses in a row. 01/06/22   Duke, Roe Rutherford, PA  ondansetron (ZOFRAN) 4 MG tablet Take 1 tablet (4 mg total) by mouth every 8 (eight) hours as needed for nausea or vomiting. 05/18/22   [name], Willaim Rayas, MD  oxyCODONE (ROXICODONE) 5 MG immediate release tablet Take 1 tablet (5 mg total) by mouth every 4 (four) hours as needed for severe pain. 02/17/22   Tegeler, Canary Brim, MD  oxyCODONE-acetaminophen (PERCOCET/ROXICET) 5-325 MG tablet Take 1 tablet by mouth every 6 (six) hours as needed for severe pain. 03/14/22   Long, Arlyss Repress, MD  pantoprazole (PROTONIX) 40 MG tablet Take 1 tablet (40 mg total) by mouth 2 (two) times daily. 03/15/22   [name], Willaim Rayas, MD  Polyethyl Glycol-Propyl Glycol (SYSTANE OP) Place 1 drop into both eyes 4 (four) times daily as needed (for dryness).    [provider]  sucralfate (CARAFATE) 1 GM/10ML suspension Take 10 mLs (1 g total) by mouth 4 (four) times daily. 05/18/22   [name], Willaim Rayas, MD  thiamine 100 MG tablet Take 1 tablet (100 mg total) by mouth daily. 10/16/18   Rolly Salter, MD  Vitamin D, Ergocalciferol, (DRISDOL) 1.25 MG (50000 UNIT) CAPS capsule Take 50,000 Units by mouth every Wednesday.    [provider]  amitriptyline (ELAVIL) 25 MG tablet Take 1 tablet (25 mg total) by mouth at bedtime. Patient not taking: Reported on 08/08/2019 10/15/18 08/08/19  Rolly Salter, MD       Allergies    Robaxin [methocarbamol], Aspirin, Shellfish-derived products, Trazodone and nefazodone, Adhesive [tape], Latex, Toradol [ketorolac tromethamine], Contrast media [iodinated contrast media], Reglan [metoclopramide], and Salmon [fish oil]    Review of Systems   Review of Systems  Cardiovascular:  Positive for chest pain.  Gastrointestinal:  Positive for abdominal pain.    Physical Exam Updated Vital Signs BP (!) 166/104 (BP Location: Left Arm)   Pulse 91   Temp (!) 97.4 F (36.3 C) (Oral)   Resp 17   Ht 5\' 8"  (1.727 m)   Wt 52.2 kg   SpO2 100%   BMI 17.49 kg/m  Physical Exam Vitals and nursing note reviewed.  Constitutional:      General:  He is not in acute distress.    Appearance: He is well-developed. He is not diaphoretic.  HENT:     Head: Normocephalic and atraumatic.  Eyes:     General: No scleral icterus.    Conjunctiva/sclera: Conjunctivae normal.  Cardiovascular:     Rate and Rhythm: Normal rate and regular rhythm.     Heart sounds: Normal heart sounds.  Pulmonary:     Effort: Pulmonary effort is normal. No respiratory distress.     Breath sounds: Normal breath sounds.  Abdominal:     Palpations: Abdomen is soft.     Tenderness: There is generalized abdominal tenderness. There is no guarding.  Musculoskeletal:     Cervical back: Normal range of motion and neck supple.     Comments: Patient appears to be in mild to moderate pain, antalgic gait noted. Lumbosacral spine area reveals no local tenderness or mass. Painful and reduced LS ROM noted. Straight leg raise is negative  DTR's, motor strength and sensation normal, including heel and toe gait.  Peripheral pulses are palpable.   Skin:    General: Skin is warm and dry.  Neurological:     Mental Status: He is alert.  Psychiatric:        Behavior: Behavior normal.     ED Results / Procedures / Treatments   Labs (all labs ordered are listed, but only abnormal results are displayed) Labs  Reviewed  BASIC METABOLIC PANEL - Abnormal; Notable for the following components:      Result Value   CO2 15 (*)    Glucose, Bld 137 (*)    Calcium 8.1 (*)    All other components within normal limits  CBC - Abnormal; Notable for the following components:   RBC 3.67 (*)    Hemoglobin 11.5 (*)    HCT 34.8 (*)    RDW 16.3 (*)    All other components within normal limits  ETHANOL - Abnormal; Notable for the following components:   Alcohol, Ethyl (B) 119 (*)    All other components within normal limits  LIPASE, BLOOD  VITAMIN B1  TROPONIN I (HIGH SENSITIVITY)    EKG EKG Interpretation  Date/Time:  Saturday May 21 2022 12:42:53 EST Ventricular Rate:  90 PR Interval:  100 QRS Duration: 70 QT Interval:  356 QTC Calculation: 435 R Axis:   75 Text Interpretation: Sinus rhythm with marked sinus arrhythmia with short PR with frequent Premature ventricular complexes Biatrial enlargement Abnormal ECG When compared with ECG of 14-Mar-2022 05:36, PREVIOUS ECG IS PRESENT Confirmed by Vivien Rossetti ([id number]) on 05/21/2022 4:07:03 PM  Radiology MR Lumbar Spine W Wo Contrast  Result Date: 05/21/2022 CLINICAL DATA:  Initial evaluation for low back pain, cauda equina syndrome suspected. EXAM: MRI LUMBAR SPINE WITHOUT AND WITH CONTRAST TECHNIQUE: Multiplanar and multiecho pulse sequences of the lumbar spine were obtained without and with intravenous contrast. CONTRAST:  5mL GADAVIST GADOBUTROL 1 MMOL/ML IV SOLN COMPARISON:  Comparison made with previous abdominal MRI from 12/22/2021. FINDINGS: Segmentation: Standard. Lowest well-formed disc space labeled the L5-S1 level. Alignment: 3 mm retrolisthesis of L5 on S1. Straightening of the normal lumbar lordosis. Vertebrae: Vertebral body height maintained without acute or chronic fracture. Bone marrow signal intensity within normal limits. No discrete or worrisome osseous lesions. No abnormal marrow edema or enhancement. Conus medullaris and cauda  equina: Conus extends to the L1 level. Conus and cauda equina appear normal. Paraspinal and other soft tissues: Paraspinous soft tissues demonstrate no acute finding. 2.3 cm simple  cyst at the pancreatic body noted, relatively stable from prior MRI. Disc levels: L1-2: Disc desiccation with mild disc bulge. No spinal stenosis. Foramina remain patent. L2-3: Disc desiccation with mild circumferential disc bulge. Mild facet spurring. No spinal stenosis. Foramina remain patent. L3-4: Disc desiccation with diffuse disc bulge, slightly asymmetric to the left. Central annular fissure. Mild facet and ligament flavum hypertrophy. No significant spinal stenosis. Mild bilateral L3 foraminal narrowing. L4-5: Disc desiccation. Superimposed small central disc protrusion with annular fissure indents the ventral thecal sac (series 5, image 35). Mild facet and ligament flavum hypertrophy. No significant spinal stenosis. Foramina remain patent. L5-S1: Lesion intervertebral disc space narrowing with diffuse disc bulge and disc desiccation. Mild reactive endplate spurring. Superimposed small central to left subarticular disc protrusion minimally indents the ventral thecal sac (series 5, image 40). Slight posterior displacement of the descending left S1 nerve root without frank neural impingement. No significant canal or lateral recess stenosis. Mild bilateral L5 foraminal narrowing. IMPRESSION: 1. No acute abnormality within the lumbar spine. No evidence for cord compression. 2. Small central to left subarticular disc protrusion at L5-S1, potentially affecting the descending left S1 nerve root. 3. Small central disc protrusion at L4-5 without significant stenosis or impingement. 4. Mild bilateral L3 and L5 foraminal stenosis related to disc bulge and facet hypertrophy. Electronically Signed   By: Rise Mu M.D.   On: 05/21/2022 18:27   DG Chest 2 View  Result Date: 05/21/2022 CLINICAL DATA:  Chest pain. Patient reports  mid and left-sided pain. EXAM: CHEST - 2 VIEW COMPARISON:  Radiograph 03/14/2022 CT 12/06/2021 FINDINGS: The cardiomediastinal contours are normal. Emphysematous changes on prior CT not well seen by radiograph. Minor central bronchial thickening. Pulmonary vasculature is normal. No consolidation, pleural effusion, or pneumothorax. No acute osseous abnormalities are seen. IMPRESSION: Minor central bronchial thickening which is likely chronic. Electronically Signed   By: Narda Rutherford M.D.   On: 05/21/2022 13:30    Procedures Procedures    Medications Ordered in ED Medications  alum & mag hydroxide-simeth (MAALOX/MYLANTA) 200-200-20 MG/5ML suspension 30 mL (30 mLs Oral Given 05/21/22 1600)    And  lidocaine (XYLOCAINE) 2 % viscous mouth solution 15 mL (15 mLs Oral Given 05/21/22 1600)  gadobutrol (GADAVIST) 1 MMOL/ML injection 5 mL (5 mLs Intravenous Contrast Given 05/21/22 1731)  acetaminophen (TYLENOL) tablet 650 mg (650 mg Oral Given 05/21/22 1828)  alum & mag hydroxide-simeth (MAALOX/MYLANTA) 200-200-20 MG/5ML suspension 30 mL (30 mLs Oral Given 05/21/22 2033)    And  lidocaine (XYLOCAINE) 2 % viscous mouth solution 15 mL (15 mLs Oral Given 05/21/22 2033)  acetaminophen (TYLENOL) tablet 650 mg (650 mg Oral Given 05/21/22 2030)    ED Course/ Medical Decision Making/ A&P Clinical Course as of 05/22/22 1155  Sat May 21, 2022  1615 Alcohol, Ethyl (B)(!): 119 [AH]  1615 Hemoglobin(!): 11.5 [AH]  1615 Troponin I (High Sensitivity): 6 [AH]  1615 Lipase: 34 [AH]  1615 Calcium(!): 8.1 [AH]    Clinical Course User Index [AH] Arthor Captain, PA-C                             Medical Decision Making Patient here with acute on chronic abdominal pain The differential diagnosis for generalized abdominal pain includes, but is not limited to AAA, gastroenteritis, appendicitis, Bowel obstruction, Bowel perforation. Gastroparesis, DKA, Hernia, Inflammatory bowel disease, mesenteric ischemia,  pancreatitis, peritonitis SBP, volvulus.  As well as low back pain Emergent considerations  in the differential diagnosis of back pain include:occult fracture, congenital anomalies, tumors, vascular catastrophes, osteomyelitis of vertebrae, infections of disc, meninges or cord, space occupying lesions within canal leading to cord or root compression including epidural abscess.  After review of all data points patient is diagnose with : (K29.00) Acute gastritis without hemorrhage, unspecified gastritis type  (primary encounter diagnosis)  (M54.50,  G89.29) Chronic bilateral low back pain, unspecified whether sciatica present   A copy of the American Heart Association low cholesterol diet is provided, and the diet is discussed with the patient.  I reviewed and interpreted the pateints labs . Pertinent results as follows: ETOH level 119 without signs of intoxication Lipase, troponin- WNL Bmp w elevated glucosemild hypocalcemia and bicarb 15- likel etoh ketosis or loss from vomiting/diarrhea  I personally visualized and interpreted the images using our PACS system. Acute findings include:  No acute findings on MRI or 2 v cxr  Pain improved with tylenol and GI cocktail- no narcotics delivered.  He is amblatory without weakness. Discussed d/c alcohol use esp in the setting of prednisone use.  Safe for d/c with op f//u and return precautions. Suggest otc acid reducers   Amount and/or Complexity of Data Reviewed Labs: ordered. Decision-making details documented in ED Course. Radiology: ordered.  Risk OTC drugs. Prescription drug management. Risk Details: No HEART calculation bc sxs not consistent with ACS           Final Clinical Impression(s) / ED Diagnoses Final diagnoses:  Acute gastritis without hemorrhage, unspecified gastritis type  Chronic bilateral low back pain, unspecified whether sciatica present    Rx / DC Orders ED Discharge Orders     None          Arthor Captain, PA-C 05/22/22 1155    Mardene Sayer, MD 05/22/22 1214

## 2022-05-21 NOTE — ED Notes (Signed)
Pt back from Mri, pt requests pain med, PA notified, pain med given.

## 2022-05-21 NOTE — ED Notes (Signed)
After triage patient handed me a sticky note that his PT therapist wants him checked for cauda equina due to left leg weakness diff urinating decreased erection

## 2022-05-21 NOTE — ED Triage Notes (Signed)
Patient here for his pancreatitis and chest pain.  Went to Rehab center and was sent here. Still currently drinking alcohol

## 2022-05-21 NOTE — Therapy (Signed)
OUTPATIENT PHYSICAL THERAPY THORACOLUMBAR EVALUATION   Patient Name: [name] MRN: [id number] DOB:1970/06/13, 52 y.o., male Today's Date: 05/21/2022  END OF SESSION:   Past Medical History:  Diagnosis Date   Alcoholism /alcohol abuse    Anemia    Anxiety    Arthritis    knees; arms; elbows (03/26/2015)   Asthma    Bipolar disorder (HCC)    Chronic bronchitis (HCC)    Chronic lower back pain    Chronic pancreatitis ([place])    Cocaine abuse ([place])    Depression    Family history of adverse reaction to anesthesia    Femoral condyle fracture ([place]) 03/08/2014   left medial/notes 03/09/2014   GERD (gastroesophageal reflux disease)    H/O hiatal hernia    H/O suicide attempt 10/2012   High cholesterol    History of blood transfusion 10/2012   when I tried to commit suicide   History of stomach ulcers    Hypertension    Marijuana abuse, continuous    Migraine    a few times/year (03/26/2015)   Pneumonia 1990's X 3   PTSD (post-traumatic stress disorder)    Sickle cell trait ([place])    WPW (Wolff-Parkinson-White syndrome)    [name] 03/06/2013   Past Surgical History:  Procedure Laterality Date   BIOPSY  11/25/2017   Procedure: BIOPSY;  Surgeon: Arta Silence, MD;  Location: [place];  Service: Endoscopy;;   BIOPSY  10/14/2018   Procedure: BIOPSY;  Surgeon: Arta Silence, MD;  Location: [place];  Service: Endoscopy;;   CARDIAC CATHETERIZATION     CYST ENTEROSTOMY  01/02/2020   Procedure: CYST ASPIRATION;  Surgeon: Milus Banister, MD;  Location: [facility];  Service: Endoscopy;;   ESOPHAGOGASTRODUODENOSCOPY (EGD) WITH PROPOFOL N/A 11/25/2017   Procedure: ESOPHAGOGASTRODUODENOSCOPY (EGD) WITH PROPOFOL;  Surgeon: Arta Silence, MD;  Location: [place];  Service: Endoscopy;  Laterality: N/A;   ESOPHAGOGASTRODUODENOSCOPY (EGD) WITH PROPOFOL Left 10/14/2018   Procedure: ESOPHAGOGASTRODUODENOSCOPY (EGD) WITH PROPOFOL;  Surgeon: Arta Silence, MD;   Location[facility];  Service: Endoscopy;  Laterality: Left;   ESOPHAGOGASTRODUODENOSCOPY (EGD) WITH PROPOFOL N/A 11/14/2018   Procedure: ESOPHAGOGASTRODUODENOSCOPY (EGD) WITH PROPOFOL;  Surgeon: Laurence Spates, MD;  Location: [facility];  Service: Gastroenterology;  Laterality: N/A;   ESOPHAGOGASTRODUODENOSCOPY (EGD) WITH PROPOFOL N/A 01/02/2020   Procedure: ESOPHAGOGASTRODUODENOSCOPY (EGD) WITH PROPOFOL;  Surgeon: Milus Banister, MD;  Location: [facility];  Service: Endoscopy;  Laterality: N/A;   ESOPHAGOGASTRODUODENOSCOPY (EGD) WITH PROPOFOL N/A 10/25/2020   Procedure: ESOPHAGOGASTRODUODENOSCOPY (EGD) WITH PROPOFOL;  Surgeon: Rush Landmark Telford Nab., MD;  Location: [place];  Service: Gastroenterology;  Laterality: N/A;   EUS N/A 01/02/2020   Procedure: UPPER ENDOSCOPIC ULTRASOUND (EUS) RADIAL;  Surgeon: Milus Banister, MD;  Location: [facility];  Service: Endoscopy;  Laterality: N/A;   EYE SURGERY Left 1990's   "result of trauma"    FACIAL FRACTURE SURGERY Left 1990's   "result of trauma"    FLEXIBLE SIGMOIDOSCOPY N/A 10/25/2020   Procedure: FLEXIBLE SIGMOIDOSCOPY;  Surgeon: Irving Copas., MD;  Location: [place];  Service: Gastroenterology;  Laterality: N/A;   FRACTURE SURGERY     HEMOSTASIS CLIP PLACEMENT  10/25/2020   Procedure: HEMOSTASIS CLIP PLACEMENT;  Surgeon: Irving Copas., MD;  Location: [place];  Service: Gastroenterology;;   HERNIA REPAIR     LEFT HEART CATHETERIZATION WITH CORONARY ANGIOGRAM Right 03/07/2013   Procedure: LEFT HEART CATHETERIZATION WITH CORONARY ANGIOGRAM;  Surgeon: [name], MD;  Location: [facility];  Service: Cardiovascular;  Laterality: Right;   UMBILICAL HERNIA REPAIR     UPPER GASTROINTESTINAL ENDOSCOPY     Patient Active Problem List   Diagnosis Date Noted   NSVT (nonsustained ventricular tachycardia) ([place]) 01/20/2022   Normal anion gap metabolic acidosis 16/11/3708   Hypoalbuminemia 12/08/2021   Acute  recurrent pancreatitis 12/07/2021   WPW (Wolff-Parkinson-White syndrome) 12/06/2021   Paroxysmal atrial fibrillation with RVR (HCC) 12/06/2021   Pulmonary nodule 12/06/2021   Leukocytosis 06/07/2021   Epigastric pain 62/69/4854   Acute alcoholic pancreatitis 62/70/3500   Urinary retention    Protein-calorie malnutrition, severe 11/17/2020   Acute pancreatitis 11/15/2020   Hypomagnesemia    Hypocalcemia 10/23/2020   AKI (acute kidney injury) ([place]) 11/13/2018   Seizure ([place]) 11/13/2018   Chest pain 01/08/2018   Acute on chronic pancreatitis ([place]) 09/28/2017   Abdominal pain 05/27/2017   Nausea & vomiting 03/18/2017   Diarrhea 03/18/2017   Gastroesophageal reflux disease    Normocytic anemia 12/05/2016   Alcohol use disorder, severe, dependence ([place]) 07/25/2016   Cocaine use disorder, severe, dependence ([place]) 07/25/2016   Major depressive disorder, recurrent severe without psychotic features ([place]) 07/20/2016   Chronic pancreatitis ([place]) 05/18/2015   Essential hypertension 02/06/2014   Mood disorder ([place]) 02/06/2014   Pancreatic pseudocyst/cyst 11/25/2013   [name] White pattern seen on electrocardiogram 10/03/2012   Tobacco abuse 03/23/2007    PCP: No PCP  REFERRING PROVIDER: Renette Butters, MD   REFERRING DIAG:  M54.50 (ICD-10-CM) - Lumbago  M54.10 (ICD-10-CM) - Radiculopathy    Rationale for Evaluation and Treatment: Rehabilitation  THERAPY DIAG:  No diagnosis found.  ONSET DATE: 7 months ago  SUBJECTIVE:                                                                                                                                                                                           SUBJECTIVE STATEMENT: Pt reports primary c/o Rt>Lt LBP and Rt anterior thigh radicular pain and N/T of insidious onset lasting about 7 months. Current pain is 6/10. Worst pain is 7/10. Best pain is 1/10. Aggravating factors include sleeping on back or on Rt side, walking>  10 minutes, walking up steps. Easing factors include sitting, pain cream/ scrub, hot bath, epsom salt. Pt also reports N/T on his genitalia lasting the same amount of time, weakness in his Rt LE, trouble urinating (pain and difficulty), penile retraction (2 months), bowel incontinence, decreased sensation during intercourse, and diminished erection/ arousal.   PERTINENT HISTORY:  Alcohol abuse, anxiety, asthma, bipolar disorder, depression, PTSD, Wolff-Parkinson-White Syndrome, HTN, cocaine abuse, chronic pancreatitis   PAIN:  Are you having pain? Yes: NPRS scale:  6/10 Pain location: low back , Rt LE to anterior thigh, N/T in groin Pain description: Achy, sharp, N/T Aggravating factors: Rt side, walking> 10 minutes, walking up steps Relieving factors: sitting, pain cream/ scrub, hot bath, epsom salt  PRECAUTIONS: Other: Concern for Cauda Equina Syndrome, no treatment until ruled out  WEIGHT BEARING RESTRICTIONS: No  FALLS:  Has patient fallen in last 6 months? No  LIVING ENVIRONMENT: Lives with: lives with their family Lives in: House/apartment Stairs: Yes: External: 4 steps; none Has following equipment at home: None  PLOF: Independent  PATIENT GOALS: Return to walking, stairs, exercising  Screening for Suicide  Answer the following questions with Yes or No and place an "x" beside the action taken.  1. Over the past two weeks, have you felt down, depressed, or hopeless?   No  2. Within the past two weeks, have you felt little interest or pleasure in life?  No  If YES to either #1 or #2, then ask #3  3. Have you had thoughts that that life is not worth living or that you might be       better off dead?     If answer is NO and suspicion is low, then end   4. Over this past week, have you had any thoughts about hurting or even killing yourself?    If NO, then end. Patient in no immediate danger   5. If so, do you believe that you intend to or will harm yourself?        If NO, then end. Patient in no immediate danger   6.  Do you have a plan as to how you would hurt yourself?     7.  Over this past week, have you actually done anything to hurt yourself?    IF YES answers to either #4, #5, #6 or #7, then patient is AT RISK for suicide   Actions Taken  __x__  Screening negative; no further action required  ____  Screening positive; no immediate danger and patient already in treatment with a  mental health provider. Advise patient to speak to their mental health provider.  ____  Screening positive; no immediate danger. Patient advised to contact a mental  health provider for further assessment.   ____  Screening positive; in immediate danger as patient states intention of killing self,  has plan and a sense of imminence. Do not leave alone. Seek permission from  patient to contact a family member to inform them. Direct patient to go to ED.   OBJECTIVE:   DIAGNOSTIC FINDINGS:  N/A  PATIENT SURVEYS:  Not performed due to pt being referred out  SCREENING FOR RED FLAGS: See subjective section   COGNITION: Overall cognitive status: Within functional limits for tasks assessed     SENSATION: Not performed due to pt being referred out  MUSCLE LENGTH: Not performed due to pt being referred out  POSTURE: Not assessed due to pt being referred out  PALPATION: Not performed due to pt being referred out  LUMBAR ROM:   AROM eval  Flexion   Extension   Right lateral flexion   Left lateral flexion   Right rotation   Left rotation    (Blank rows = not tested)   LOWER EXTREMITY MMT:    MMT Right eval Left eval  Hip flexion    Hip extension    Hip abduction    Knee flexion    Knee extension    Ankle dorsiflexion  Ankle plantarflexion     (Blank rows = not tested)  LUMBAR SPECIAL TESTS:  Not performed due to pt being referred out  FUNCTIONAL TESTS:  Squat: Forearm Plank: 5xSTS   TODAY'S TREATMENT:                                                                                                                                [facility] Adult PT Treatment:                                                DATE: 05/21/2022 Deferred due to pt being referred to ED    PATIENT EDUCATION:  Education details: Pt educated on probable underlying pathophysiology, instructed to go to ED today Person educated: Patient Education method: Explanation, Demonstration, and Handouts Education comprehension: verbalized understanding and returned demonstration  HOME EXERCISE PROGRAM: N/A  ASSESSMENT:  CLINICAL IMPRESSION: Patient is a 52 y.o. M who was seen today for physical therapy evaluation and treatment for subacute LBP with Rt sided radicular symptoms.  Upon taking subjective information, several red flags were noted. Pt is experiencing N/T on his genitalia, weakness in his Rt LE, trouble urinating (pain and difficulty), penile retraction (2 months), bowel incontinence, decreased sensation during intercourse, and diminished erection/ arousal. All of these symptoms have begun since the onset of his LBP 7 months ago. High concern for cauda equina syndrome as these symptoms are not explained by typical musculoskeletal pathology. Pt was encouraged to go immediately to the ED. PT recommends MRI to rule out/ in cauda equina syndrome prior to further assessment or treatment in physical therapy.    PLAN:  PT FREQUENCY: N/A  PT DURATION: N/A  PLANNED INTERVENTIONS: N/A  PLAN FOR NEXT SESSION: Complete evaluation once cauda equina syndrome is ruled out   Vanessa Hiltonia, PT, DPT 05/21/22 8:59 AM

## 2022-05-21 NOTE — ED Notes (Signed)
Called multiple times and unable to find pt in lobby.

## 2022-05-21 NOTE — ED Provider Triage Note (Signed)
Emergency Medicine Provider Triage Evaluation Note  [name] , a 52 y.o. male  was evaluated in triage.  Pt complains of multiple complaints.  Complains of his chronic abdominal pain and chest pain started today.  He also reports he is in having some chronic lower back pain for the past month.  He is having fecal and urinary incontinence for the past month and has been having to wear depends.  He is having some bilateral lower leg weakness.  He was sent in from PT today due to his weakness in his legs and concern for cauda equina.  Patient also mentions he is having some numbness in his bottom area.  Review of Systems  Positive:  Negative:   Physical Exam  BP (!) 171/104 (BP Location: Right Arm)   Pulse 84   Temp 97.6 F (36.4 C)   Resp 18   Ht '5\' 8"'$  (1.727 m)   Wt 52.2 kg   SpO2 100%   BMI 17.49 kg/m  Gen:   Awake, no distress   Resp:  Normal effort  MSK:   Moves extremities without difficulty  Other:  Generalized bilateral weakness in the lower extremities.  Patient is ambulatory with normal gait however.  Abdomen is soft with some epigastric tenderness.  Likely is pancreatitis.  His abdomen is soft without any guarding or rebound.  No peritoneal signs.  Medical Decision Making  Medically screening exam initiated at 1:08 PM.  Appropriate orders placed.  Lyrik Daneil Beem was informed that the remainder of the evaluation will be completed by another provider, this initial triage assessment does not replace that evaluation, and the importance of remaining in the ED until their evaluation is complete.  He denies any IV drug use although has polysubstance record.  Will order MRI with or without contrast for any abscess or suspicion for cauda equina.  Labs ordered as well for his chest pain and abdominal pain.   Sherrell Puller, PA-C 05/21/22 1309

## 2022-05-25 LAB — VITAMIN B1: Vitamin B1 (Thiamine): 112.6 nmol/L (ref 66.5–200.0)

## 2022-05-27 NOTE — Therapy (Incomplete)
OUTPATIENT PHYSICAL THERAPY THORACOLUMBAR EVALUATION   Patient Name: [name] MRN: [id number] DOB:[birth year], 52 y.o., male Today's Date: 05/27/2022  END OF SESSION:   Past Medical History:  Diagnosis Date   Alcoholism /alcohol abuse    Anemia    Anxiety    Arthritis    knees; arms; elbows (03/26/2015)   Asthma    Bipolar disorder (HCC)    Chronic bronchitis (HCC)    Chronic lower back pain    Chronic pancreatitis ([place])    Cocaine abuse ([place])    Depression    Family history of adverse reaction to anesthesia    Femoral condyle fracture ([place]) 03/08/2014   left medial/notes 03/09/2014   GERD (gastroesophageal reflux disease)    H/O hiatal hernia    H/O suicide attempt 10/2012   High cholesterol    History of blood transfusion 10/2012   when I tried to commit suicide   History of stomach ulcers    Hypertension    Marijuana abuse, continuous    Migraine    a few times/year (03/26/2015)   Pneumonia 1990's X 3   PTSD (post-traumatic stress disorder)    Sickle cell trait ([place])    WPW (Wolff-Parkinson-White syndrome)    [name] 03/06/2013   Past Surgical History:  Procedure Laterality Date   BIOPSY  11/25/2017   Procedure: BIOPSY;  Surgeon: Arta Silence, MD;  Location: [facility];  Service: Endoscopy;;   BIOPSY  10/14/2018   Procedure: BIOPSY;  Surgeon: Arta Silence, MD;  Location: [facility];  Service: Endoscopy;;   CARDIAC CATHETERIZATION     CYST ENTEROSTOMY  01/02/2020   Procedure: CYST ASPIRATION;  Surgeon: Milus Banister, MD;  Location: [facility];  Service: Endoscopy;;   ESOPHAGOGASTRODUODENOSCOPY (EGD) WITH PROPOFOL N/A 11/25/2017   Procedure: ESOPHAGOGASTRODUODENOSCOPY (EGD) WITH PROPOFOL;  Surgeon: Arta Silence, MD;  Location: [facility];  Service: Endoscopy;  Laterality: N/A;   ESOPHAGOGASTRODUODENOSCOPY (EGD) WITH PROPOFOL Left 10/14/2018   Procedure: ESOPHAGOGASTRODUODENOSCOPY (EGD) WITH PROPOFOL;  Surgeon: Arta Silence, MD;   [facility];  Service: Endoscopy;  Laterality: Left;   ESOPHAGOGASTRODUODENOSCOPY (EGD) WITH PROPOFOL N/A 11/14/2018   Procedure: ESOPHAGOGASTRODUODENOSCOPY (EGD) WITH PROPOFOL;  Surgeon: Laurence Spates, MD;  Location: [facility];  Service: Gastroenterology;  Laterality: N/A;   ESOPHAGOGASTRODUODENOSCOPY (EGD) WITH PROPOFOL N/A 01/02/2020   Procedure: ESOPHAGOGASTRODUODENOSCOPY (EGD) WITH PROPOFOL;  Surgeon: Milus Banister, MD;  Location: [facility];  Service: Endoscopy;  Laterality: N/A;   ESOPHAGOGASTRODUODENOSCOPY (EGD) WITH PROPOFOL N/A 10/25/2020   Procedure: ESOPHAGOGASTRODUODENOSCOPY (EGD) WITH PROPOFOL;  Surgeon: Rush Landmark Telford Nab., MD;  Location: [facility];  Service: Gastroenterology;  Laterality: N/A;   EUS N/A 01/02/2020   Procedure: UPPER ENDOSCOPIC ULTRASOUND (EUS) RADIAL;  Surgeon: Milus Banister, MD;  Location: [facility];  Service: Endoscopy;  Laterality: N/A;   EYE SURGERY Left 1990's   "result of trauma"    FACIAL FRACTURE SURGERY Left 1990's   "result of trauma"    FLEXIBLE SIGMOIDOSCOPY N/A 10/25/2020   Procedure: FLEXIBLE SIGMOIDOSCOPY;  Surgeon: Irving Copas., MD;  Location: [facility];  Service: Gastroenterology;  Laterality: N/A;   FRACTURE SURGERY     HEMOSTASIS CLIP PLACEMENT  10/25/2020   Procedure: HEMOSTASIS CLIP PLACEMENT;  Surgeon: Irving Copas., MD;  Location: [facility];  Service: Gastroenterology;;   HERNIA REPAIR     LEFT HEART CATHETERIZATION WITH CORONARY ANGIOGRAM Right 03/07/2013   Procedure: LEFT HEART CATHETERIZATION WITH CORONARY ANGIOGRAM;  Surgeon: [name], MD;  Location: [facility];  Service: Cardiovascular;  Laterality: Right;   UMBILICAL HERNIA REPAIR     UPPER GASTROINTESTINAL ENDOSCOPY     Patient Active Problem List   Diagnosis Date Noted   NSVT (nonsustained ventricular tachycardia) ([place]) 01/20/2022   Normal anion gap metabolic acidosis 15/17/6160   Hypoalbuminemia 12/08/2021   Acute  recurrent pancreatitis 12/07/2021   WPW (Wolff-Parkinson-White syndrome) 12/06/2021   Paroxysmal atrial fibrillation with RVR (HCC) 12/06/2021   Pulmonary nodule 12/06/2021   Leukocytosis 06/07/2021   Epigastric pain 73/71/0626   Acute alcoholic pancreatitis 94/85/4627   Urinary retention    Protein-calorie malnutrition, severe 11/17/2020   Acute pancreatitis 11/15/2020   Hypomagnesemia    Hypocalcemia 10/23/2020   AKI (acute kidney injury) ([place]) 11/13/2018   Seizure ([place]) 11/13/2018   Chest pain 01/08/2018   Acute on chronic pancreatitis ([place]) 09/28/2017   Abdominal pain 05/27/2017   Nausea & vomiting 03/18/2017   Diarrhea 03/18/2017   Gastroesophageal reflux disease    Normocytic anemia 12/05/2016   Alcohol use disorder, severe, dependence ([place]) 07/25/2016   Cocaine use disorder, severe, dependence ([place]) 07/25/2016   Major depressive disorder, recurrent severe without psychotic features ([place]) 07/20/2016   Chronic pancreatitis ([place]) 05/18/2015   Essential hypertension 02/06/2014   Mood disorder ([place]) 02/06/2014   Pancreatic pseudocyst/cyst 11/25/2013   [name] White pattern seen on electrocardiogram 10/03/2012   Tobacco abuse 03/23/2007    PCP: No PCP  REFERRING PROVIDER: Renette Butters, MD   REFERRING DIAG:  M54.50 (ICD-10-CM) - Lumbago  M54.10 (ICD-10-CM) - Radiculopathy    Rationale for Evaluation and Treatment: Rehabilitation  THERAPY DIAG:  No diagnosis found.  ONSET DATE: 7 months ago  SUBJECTIVE:                                                                                                                                                                                           SUBJECTIVE STATEMENT: Pt reports primary c/o Rt>Lt LBP and Rt anterior thigh radicular pain and N/T of insidious onset lasting about 7 months. Current pain is 6/10. Worst pain is 7/10. Best pain is 1/10. Aggravating factors include sleeping on back or on Rt side, walking>  10 minutes, walking up steps. Easing factors include sitting, pain cream/ scrub, hot bath, epsom salt. Pt also reports N/T on his genitalia lasting the same amount of time, weakness in his Rt LE, trouble urinating (pain and difficulty), penile retraction (2 months), bowel incontinence, decreased sensation during intercourse, and diminished erection/ arousal.  Pt was seen in PT last week and referred to the ED to rule out cauda equina due to these symptoms. Cauda equina has been cleared and he may continue  with initial assessment.  PERTINENT HISTORY:  Alcohol abuse, anxiety, asthma, bipolar disorder, depression, PTSD, Wolff-Parkinson-White Syndrome, HTN, cocaine abuse, chronic pancreatitis    PAIN:  Are you having pain? Yes: NPRS scale: 6/10 Pain location: low back , Rt LE to anterior thigh, N/T in groin Pain description: Achy, sharp, N/T Aggravating factors: Rt side, walking> 10 minutes, walking up steps Relieving factors: sitting, pain cream/ scrub, hot bath, epsom salt  PRECAUTIONS: None  WEIGHT BEARING RESTRICTIONS: No  FALLS:  Has patient fallen in last 6 months? No  LIVING ENVIRONMENT: Lives with: lives with their family Lives in: House/apartment Stairs: Yes: External: 4 steps; none Has following equipment at home: None  OCCUPATION: ***  PLOF: Independent  PATIENT GOALS: Return to walking, stairs, exercising   Screening for Suicide   Answer the following questions with Yes or No and place an "x" beside the action taken.   1. Over the past two weeks, have you felt down, depressed, or hopeless?   No   2. Within the past two weeks, have you felt little interest or pleasure in life?  No   If YES to either #1 or #2, then ask #3   3. Have you had thoughts that that life is not worth living or that you might be       better off dead?      If answer is NO and suspicion is low, then end     4. Over this past week, have you had any thoughts about hurting or even killing  yourself?     If NO, then end. Patient in no immediate danger     5. If so, do you believe that you intend to or will harm yourself?                                      If NO, then end. Patient in no immediate danger     6.  Do you have a plan as to how you would hurt yourself?       7.  Over this past week, have you actually done anything to hurt yourself?      IF YES answers to either #4, #5, #6 or #7, then patient is AT RISK for suicide     Actions Taken   __x__  Screening negative; no further action required   ____  Screening positive; no immediate danger and patient already in treatment with a  mental health provider. Advise patient to speak to their mental health provider.   ____  Screening positive; no immediate danger. Patient advised to contact a mental  health           provider for further assessment.    ____  Screening positive; in immediate danger as patient states intention of killing self,  has plan and a sense of imminence. Do not leave alone. Seek permission from  patient to contact a family member to inform them. Direct patient to go to ED.   OBJECTIVE:   DIAGNOSTIC FINDINGS:   05/21/2022: MR Lumbar Spine W or WO Contrast: IMPRESSION: 1. No acute abnormality within the lumbar spine. No evidence for cord compression. 2. Small central to left subarticular disc protrusion at L5-S1, potentially affecting the descending left S1 nerve root. 3. Small central disc protrusion at L4-5 without significant stenosis or impingement. 4. Mild bilateral L3 and L5 foraminal stenosis related to disc bulge  and facet hypertrophy.  PATIENT SURVEYS:  Modified Oswestry ***   SCREENING FOR RED FLAGS: Bowel or bladder incontinence: {Yes/No:304960894} Cauda equina syndrome: {Yes/No:304960894}  COGNITION: Overall cognitive status: {cognition:24006}     SENSATION: {sensation:27233}  MUSCLE LENGTH: Hamstrings: Right *** deg; Left *** deg Thomas test: Right *** deg;  Left *** deg  POSTURE: {posture:25561}  PALPATION: ***  LUMBAR ROM:   AROM eval  Flexion   Extension   Right lateral flexion   Left lateral flexion   Right rotation   Left rotation    (Blank rows = not tested)  LOWER EXTREMITY ROM:     {AROM/PROM:27142}  Right eval Left eval  Hip flexion    Hip extension    Hip abduction    Hip adduction    Hip internal rotation    Hip external rotation    Knee flexion    Knee extension    Ankle dorsiflexion    Ankle plantarflexion    Ankle inversion    Ankle eversion     (Blank rows = not tested)  LOWER EXTREMITY MMT:    MMT Right eval Left eval  Hip flexion    Hip extension    Hip abduction    Knee flexion    Knee extension    Ankle dorsiflexion    Ankle plantarflexion     (Blank rows = not tested)  LUMBAR SPECIAL TESTS:  {lumbar special test:25242}  FUNCTIONAL TESTS:  Squat: 5xSTS: Forearm plank:   TODAY'S TREATMENT:                                                                                                                               [facility] Adult PT Treatment:                                                DATE: 05/27/2022 Therapeutic Exercise: *** Manual Therapy: *** Neuromuscular re-ed: *** Therapeutic Activity: *** Modalities: *** Self Care: ***    PATIENT EDUCATION:  Education details: Pt educated on probable underlying pathophysiology, POC, prognosis, ODI, and HEP Person educated: Patient Education method: Explanation, Demonstration, and Handouts Education comprehension: verbalized understanding and returned demonstration  HOME EXERCISE PROGRAM: ***  ASSESSMENT:  CLINICAL IMPRESSION: Patient is a *** y.o. *** who was seen today for physical therapy evaluation and treatment for ***.   OBJECTIVE IMPAIRMENTS: {opptimpairments:25111}.   ACTIVITY LIMITATIONS: {activitylimitations:27494}  PARTICIPATION LIMITATIONS: {participationrestrictions:25113}  PERSONAL FACTORS: {Personal  factors:25162} are also affecting patient's functional outcome.   REHAB POTENTIAL: {rehabpotential:25112}  CLINICAL DECISION MAKING: {clinical decision making:25114}  EVALUATION COMPLEXITY: {Evaluation complexity:25115}   GOALS: Goals reviewed with patient? Yes  SHORT TERM GOALS: Target date: 06/25/2022  Pt will report understanding and adherence to initial HEP in order to promote independence in the management of primary impairments. Baseline: HEP provided at eval  Goal status: INITIAL  2.  *** Baseline:  Goal status: {GOALSTATUS:25110}  3.  *** Baseline:  Goal status: {GOALSTATUS:25110}   LONG TERM GOALS: Target date: 07/23/2022  Pt will achieve an ODI score of *** in order to demonstrate improved functional ability as it relates to the pt's primary impairments. Baseline: *** Goal status: INITIAL  2.  *** Baseline:  Goal status: {GOALSTATUS:25110}  3.  *** Baseline:  Goal status: {GOALSTATUS:25110}  4.  *** Baseline:  Goal status: {GOALSTATUS:25110}  5.  *** Baseline:  Goal status: {GOALSTATUS:25110}  6.  *** Baseline:  Goal status: {GOALSTATUS:25110}  PLAN:  PT FREQUENCY: {rehab frequency:25116}  PT DURATION: {rehab duration:25117}  PLANNED INTERVENTIONS: {rehab planned interventions:25118::"Therapeutic exercises","Therapeutic activity","Neuromuscular re-education","Balance training","Gait training","Patient/Family education","Self Care","Joint mobilization"}.  PLAN FOR NEXT SESSION: Vanessa Uvalde, PT, DPT 05/27/22 10:34 AM

## 2022-05-28 ENCOUNTER — Encounter (HOSPITAL_COMMUNITY): Payer: Self-pay

## 2022-05-28 ENCOUNTER — Emergency Department (HOSPITAL_COMMUNITY): Payer: Medicaid Other

## 2022-05-28 ENCOUNTER — Ambulatory Visit: Payer: Medicaid Other

## 2022-05-28 ENCOUNTER — Emergency Department (HOSPITAL_COMMUNITY)
Admission: EM | Admit: 2022-05-28 | Discharge: 2022-05-28 | Disposition: A | Discharge status: Home / Self Care | Payer: Medicaid Other | Attending: Emergency Medicine | Admitting: Emergency Medicine

## 2022-05-28 ENCOUNTER — Other Ambulatory Visit: Payer: Self-pay

## 2022-05-28 DIAGNOSIS — K529 Noninfective gastroenteritis and colitis, unspecified: Secondary | ICD-10-CM | POA: Diagnosis not present

## 2022-05-28 DIAGNOSIS — Z79899 Other long term (current) drug therapy: Secondary | ICD-10-CM | POA: Insufficient documentation

## 2022-05-28 DIAGNOSIS — I10 Essential (primary) hypertension: Secondary | ICD-10-CM | POA: Diagnosis not present

## 2022-05-28 DIAGNOSIS — E876 Hypokalemia: Secondary | ICD-10-CM | POA: Diagnosis not present

## 2022-05-28 DIAGNOSIS — F419 Anxiety disorder, unspecified: Secondary | ICD-10-CM | POA: Diagnosis not present

## 2022-05-28 DIAGNOSIS — K861 Other chronic pancreatitis: Secondary | ICD-10-CM | POA: Diagnosis not present

## 2022-05-28 DIAGNOSIS — Z91040 Latex allergy status: Secondary | ICD-10-CM | POA: Insufficient documentation

## 2022-05-28 DIAGNOSIS — R1010 Upper abdominal pain, unspecified: Secondary | ICD-10-CM | POA: Diagnosis present

## 2022-05-28 LAB — CBC
HCT: 33 % — ABNORMAL LOW (ref 39.0–52.0)
Hemoglobin: 10.8 g/dL — ABNORMAL LOW (ref 13.0–17.0)
MCH: 31 pg (ref 26.0–34.0)
MCHC: 32.7 g/dL (ref 30.0–36.0)
MCV: 94.8 fL (ref 80.0–100.0)
Platelets: 145 10*3/uL — ABNORMAL LOW (ref 150–400)
RBC: 3.48 MIL/uL — ABNORMAL LOW (ref 4.22–5.81)
RDW: 16.8 % — ABNORMAL HIGH (ref 11.5–15.5)
WBC: 11.4 10*3/uL — ABNORMAL HIGH (ref 4.0–10.5)
nRBC: 0 % (ref 0.0–0.2)

## 2022-05-28 LAB — URINALYSIS, ROUTINE W REFLEX MICROSCOPIC
Bacteria, UA: NONE SEEN
Bilirubin Urine: NEGATIVE
Glucose, UA: NEGATIVE mg/dL
Hgb urine dipstick: NEGATIVE
Ketones, ur: NEGATIVE mg/dL
Leukocytes,Ua: NEGATIVE
Nitrite: NEGATIVE
Protein, ur: NEGATIVE mg/dL
Specific Gravity, Urine: 1.005 (ref 1.005–1.030)
pH: 5 (ref 5.0–8.0)

## 2022-05-28 LAB — COMPREHENSIVE METABOLIC PANEL
ALT: 37 U/L (ref 0–44)
AST: 46 U/L — ABNORMAL HIGH (ref 15–41)
Albumin: 4.1 g/dL (ref 3.5–5.0)
Alkaline Phosphatase: 76 U/L (ref 38–126)
Anion gap: 15 (ref 5–15)
BUN: <5 mg/dL — ABNORMAL LOW (ref 6–20)
CO2: 17 mmol/L — ABNORMAL LOW (ref 22–32)
Calcium: 8.1 mg/dL — ABNORMAL LOW (ref 8.9–10.3)
Chloride: 107 mmol/L (ref 98–111)
Creatinine, Ser: 0.72 mg/dL (ref 0.61–1.24)
GFR, Estimated: >60 mL/min (ref >60)
Glucose, Bld: 96 mg/dL (ref 70–99)
Potassium: 3.4 mmol/L — ABNORMAL LOW (ref 3.5–5.1)
Sodium: 139 mmol/L (ref 135–145)
Total Bilirubin: 0.6 mg/dL (ref 0.3–1.2)
Total Protein: 7.5 g/dL (ref 6.5–8.1)

## 2022-05-28 LAB — TROPONIN I (HIGH SENSITIVITY)
Troponin I (High Sensitivity): 11 ng/L (ref <18)
Troponin I (High Sensitivity): 11 ng/L (ref <18)

## 2022-05-28 LAB — LIPASE, BLOOD: Lipase: 81 U/L — ABNORMAL HIGH (ref 11–51)

## 2022-05-28 MED ORDER — ALUM & MAG HYDROXIDE-SIMETH 200-200-20 MG/5ML PO SUSP
30.0000 mL | Freq: Once | ORAL | Status: AC
  Administered 2022-05-28: 30 mL via ORAL
  Filled 2022-05-28: qty 30

## 2022-05-28 MED ORDER — LIDOCAINE VISCOUS HCL 2 % MT SOLN
15.0000 mL | Freq: Once | OROMUCOSAL | Status: AC
  Administered 2022-05-28: 15 mL via ORAL
  Filled 2022-05-28: qty 15

## 2022-05-28 MED ORDER — MORPHINE SULFATE (PF) 2 MG/ML IV SOLN
2.0000 mg | Freq: Once | INTRAVENOUS | Status: AC
  Administered 2022-05-28: 2 mg via INTRAVENOUS
  Filled 2022-05-28: qty 1

## 2022-05-28 MED ORDER — MORPHINE SULFATE (PF) 4 MG/ML IV SOLN
4.0000 mg | Freq: Once | INTRAVENOUS | Status: AC
  Administered 2022-05-28: 4 mg via INTRAVENOUS
  Filled 2022-05-28: qty 1

## 2022-05-28 MED ORDER — ONDANSETRON HCL 4 MG/2ML IJ SOLN
4.0000 mg | Freq: Once | INTRAMUSCULAR | Status: AC
  Administered 2022-05-28: 4 mg via INTRAVENOUS
  Filled 2022-05-28: qty 2

## 2022-05-28 MED ORDER — POTASSIUM CHLORIDE CRYS ER 20 MEQ PO TBCR
40.0000 meq | EXTENDED_RELEASE_TABLET | Freq: Once | ORAL | Status: AC
  Administered 2022-05-28: 40 meq via ORAL
  Filled 2022-05-28: qty 2

## 2022-05-28 MED ORDER — ONDANSETRON 4 MG PO TBDP: 4.0000 mg | ORAL_TABLET | Freq: Three times a day (TID) | ORAL | 0 refills | Status: AC | PRN

## 2022-05-28 MED ORDER — LIDOCAINE VISCOUS HCL 2 % MT SOLN: 15.0000 mL | Freq: Two times a day (BID) | OROMUCOSAL | 0 refills | Status: DC | PRN

## 2022-05-28 MED ORDER — SODIUM CHLORIDE 0.9 % IV BOLUS
1000.0000 mL | Freq: Once | INTRAVENOUS | Status: AC
  Administered 2022-05-28: 1000 mL via INTRAVENOUS

## 2022-05-28 MED ORDER — MAALOX MAX 400-400-40 MG/5ML PO SUSP: 5.0000 mL | Freq: Two times a day (BID) | ORAL | 0 refills | Status: AC | PRN

## 2022-05-28 NOTE — ED Provider Triage Note (Signed)
Emergency Medicine Provider Triage Evaluation Note  [name] , a 52 y.o. male  was evaluated in triage.  Pt complains of chest pain/abd pain.   Nausea and vomiting. Hx of chronic pancreatitis. Feels similar to prior. Care plan in place  Review of Systems  Positive: Abd pain, chest pain Negative: Fever   Physical Exam  BP (!) 158/119   Pulse 85   Temp 98.6 F (37 C) (Oral)   Resp 18   SpO2 100%  Gen:   Awake, no distress  Resp:  Normal effort  MSK:   Moves extremities without difficulty  Other:  Epigastric TTP  Medical Decision Making  Medically screening exam initiated at 3:35 AM.  Appropriate orders placed.  [name] was informed that the remainder of the evaluation will be completed by another provider, this initial triage assessment does not replace that evaluation, and the importance of remaining in the ED until their evaluation is complete.  [address]    [name] [place], [name] 05/28/22 [phone]

## 2022-05-28 NOTE — ED Provider Notes (Signed)
[facility] Provider Note   CSN: [id number] Arrival date & time: 05/28/22  1275     History  Chief Complaint  Patient presents with   Chest Pain    [name] is a 52 y.o. male with past medical history alcohol abuse, bipolar disorder, chronic pancreatitis, polysubstance abuse, hyperlipidemia, hypertension who presents to the ED complaining of upper abdominal pain and chest pain that started yesterday.  Patient reports that symptoms are consistent with prior flareups of his pancreatitis.  He last saw his GI specialist approximately 1 month ago with no changes to his medications.  He has been taking Creon, Carafate, pantoprazole, and Zofran at home with no relief.  He has had 4 episodes of vomiting since the onset of his symptoms.  He reports also having a few episodes of nonbloody diarrhea.  He denies fever, chills, shortness of breath, cough, congestion, diaphoresis, lightheadedness or dizziness, syncope, hematochezia, hematemesis, melena, or any other acute symptoms.  He reports that at his last visit for a flareup of prior pancreatitis he was given a GI cocktail with significant relief.  Patient reports that today his pain does seem to be significantly worse than previous pancreatitis flares though it is similar in characteristics.  He has attempted to control pain at home with Tylenol without relief.  He has an upcoming GI appointment in March 2024.  He states that his last alcohol use was 2 days ago when he consumed a sixpack of beer.     Home Medications Prior to Admission medications   Medication Sig Start Date End Date Taking? Authorizing Provider  alum & mag hydroxide-simeth (MAALOX MAX) 400-400-40 MG/5ML suspension Take 5 mLs by mouth 2 (two) times daily as needed for up to 3 days (chronic abdominal pain). 05/28/22 05/31/22 Yes Adenike Shidler L, PA-C  lidocaine (XYLOCAINE) 2 % solution Use as directed 15 mLs in the mouth or  throat 2 (two) times daily as needed for up to 3 days (chronic abdominal pain). 05/28/22 05/31/22 Yes Tyrome Donatelli L, PA-C  ondansetron (ZOFRAN-ODT) 4 MG disintegrating tablet Take 1 tablet (4 mg total) by mouth every 8 (eight) hours as needed for up to 5 days for nausea or vomiting. 05/28/22 06/02/22 Yes Rosellen Lichtenberger L, PA-C  acetaminophen (TYLENOL) 500 MG tablet Take 1,000 mg by mouth every 6 (six) hours as needed for moderate pain or headache.    [provider]  acetaminophen (TYLENOL) 650 MG CR tablet Take 1 tablet (650 mg total) by mouth every 8 (eight) hours as needed for pain. 03/05/22   Nyoka Lint, PA-C  albuterol (VENTOLIN HFA) 108 (90 Base) MCG/ACT inhaler Inhale 2 puffs into the lungs every 4 (four) hours as needed for wheezing or shortness of breath. 08/17/21   [name], MD  amLODipine (NORVASC) 10 MG tablet Take 1 tablet (10 mg total) by mouth daily. 12/25/21   [name], MD  benzonatate (TESSALON) 100 MG capsule Take 1 capsule (100 mg total) by mouth 3 (three) times daily as needed for cough. 03/14/22   Long, Wonda Olds, MD  dextromethorphan-guaiFENesin [facility] DM) 30-600 MG 12hr tablet Take 1 tablet by mouth 2 (two) times daily. 03/05/22   Nyoka Lint, PA-C  dicyclomine (BENTYL) 10 MG capsule Take 1 capsule (10 mg total) by mouth 2 (two) times daily. 03/15/22   [name], [name], MD  DULoxetine (CYMBALTA) 30 MG capsule Take 2 capsules (60 mg total) by mouth at bedtime. Take 1 capsule  daily at bedtime for 2 weeks and then increase to 2 capsules at bedtime 04/06/22   [name], [name], MD  folic acid (FOLVITE) 1 MG tablet Take 1 tablet (1 mg total) by mouth daily. 11/26/17   [name], MD  HYDROcodone-acetaminophen (NORCO/VICODIN) 5-325 MG tablet Take 1 tablet by mouth every 12 (twelve) hours as needed for moderate pain. 12/24/21   [name], MD  lidocaine (LMX) 4 % cream Apply topically 4 (four) times daily as needed (NGT pain). 12/24/21   [name], MD  lipase/protease/amylase (CREON) 36000 UNITS CPEP capsule Take 2-3 capsules (72,000-108,000 Units total) by mouth 3 (three) times daily with meals. 03/15/22   [name], [name], MD  loperamide (IMODIUM) 2 MG capsule Take 1 capsule (2 mg total) by mouth 4 (four) times daily as needed for diarrhea or loose stools. 02/17/22   Tegeler, Gwenyth Allegra, MD  magnesium oxide (MAG-OX) 400 (240 Mg) MG tablet Take 1 tablet (400 mg total) by mouth daily. 11/30/21   Davonna Belling, MD  metoprolol succinate (TOPROL XL) 25 MG 24 hr tablet Take 1 tablet (25 mg total) by mouth as needed (for palpitations). To not exceed 1 dose per day. Patient taking differently: Take 50 mg by mouth daily. 01/06/22   Duke, Tami Lin, PA  metoprolol succinate (TOPROL-XL) 200 MG 24 hr tablet Take 1 tablet (200 mg total) by mouth daily. Take with or immediately following a meal. 02/14/22   Minus Breeding, MD  Multiple Vitamin (MULTIVITAMIN WITH MINERALS) TABS tablet Take 1 tablet by mouth daily. 09/06/17   Bonnell Public, MD  nicotine (NICODERM CQ - DOSED IN MG/24 HOURS) 21 mg/24hr patch Place 1 patch (21 mg total) onto the skin at bedtime. 12/24/21   [name], MD  nicotine polacrilex (NICORETTE STARTER KIT) 4 MG gum Take 1 each (4 mg total) by mouth as needed for smoking cessation. 03/15/22   [name], [name], MD  nitroGLYCERIN (NITROSTAT) 0.4 MG SL tablet Place 1 tablet (0.4 mg total) under the tongue every 5 (five) minutes as needed for chest pain. Do not exceed 3 doses in a row. 01/06/22   Duke, Tami Lin, PA  ondansetron (ZOFRAN) 4 MG tablet Take 1 tablet (4 mg total) by mouth every 8 (eight) hours as needed for nausea or vomiting. 05/18/22   [name], [name], MD  oxyCODONE (ROXICODONE) 5 MG immediate release tablet Take 1 tablet (5 mg total) by mouth every 4 (four) hours as needed for severe pain. 02/17/22   Tegeler, Gwenyth Allegra, MD  oxyCODONE-acetaminophen (PERCOCET/ROXICET) 5-325 MG tablet  Take 1 tablet by mouth every 6 (six) hours as needed for severe pain. 03/14/22   Long, Wonda Olds, MD  pantoprazole (PROTONIX) 40 MG tablet Take 1 tablet (40 mg total) by mouth 2 (two) times daily. 03/15/22   [name], [name], MD  Polyethyl Glycol-Propyl Glycol (SYSTANE OP) Place 1 drop into both eyes 4 (four) times daily as needed (for dryness).    [provider]  sucralfate (CARAFATE) 1 GM/10ML suspension Take 10 mLs (1 g total) by mouth 4 (four) times daily. 05/18/22   [name], [name], MD  thiamine 100 MG tablet Take 1 tablet (100 mg total) by mouth daily. 10/16/18   [name], MD  Vitamin D, Ergocalciferol, (DRISDOL) 1.25 MG (50000 UNIT) CAPS capsule Take 50,000 Units by mouth every Wednesday.    [provider]  amitriptyline (ELAVIL) 25 MG tablet Take 1 tablet (25 mg total) by  mouth at bedtime. Patient not taking: Reported on 08/08/2019 10/15/18 08/08/19  [name], MD      Allergies    Robaxin [methocarbamol], Aspirin, Shellfish-derived products, Trazodone and nefazodone, Adhesive [tape], Latex, Toradol [ketorolac tromethamine], Contrast media [iodinated contrast media], Reglan [metoclopramide], and Salmon [fish oil]    Review of Systems   Review of Systems  Constitutional:  Negative for activity change, appetite change, chills and fever.  HENT:  Negative for congestion, ear pain, rhinorrhea and sore throat.   Eyes:  Negative for pain and visual disturbance.  Respiratory:  Negative for cough, chest tightness and shortness of breath.   Cardiovascular:  Positive for chest pain. Negative for palpitations and leg swelling.  Gastrointestinal:  Positive for abdominal pain, diarrhea, nausea and vomiting. Negative for anal bleeding and blood in stool.  Genitourinary:  Negative for difficulty urinating, dysuria and hematuria.  Musculoskeletal:  Negative for arthralgias, back pain, gait problem and neck pain.  Skin:  Negative for color change and rash.  Neurological:   Negative for dizziness, seizures, syncope, weakness, light-headedness, numbness and headaches.  Psychiatric/Behavioral:  Negative for confusion.   All other systems reviewed and are negative.   Physical Exam Updated Vital Signs BP (!) 192/107 (BP Location: Left Arm)   Pulse 60   Temp 98.8 F (37.1 C) (Oral)   Resp 18   SpO2 100%  Physical Exam Vitals and nursing note reviewed.  Constitutional:      Appearance: Normal appearance. He is not toxic-appearing.     Comments: Mild distress secondary to pain  HENT:     Head: Normocephalic and atraumatic.     Mouth/Throat:     Mouth: Mucous membranes are dry.  Eyes:     Extraocular Movements: Extraocular movements intact.     Conjunctiva/sclera: Conjunctivae normal.  Cardiovascular:     Rate and Rhythm: Normal rate and regular rhythm.     Heart sounds: Normal heart sounds. No murmur heard. Pulmonary:     Effort: Pulmonary effort is normal.     Breath sounds: Normal breath sounds.  Chest:     Chest wall: No tenderness, crepitus or edema.  Abdominal:     General: Abdomen is flat.     Palpations: Abdomen is soft.     Tenderness: There is abdominal tenderness (diffuse upper). There is no guarding or rebound.  Musculoskeletal:        General: Normal range of motion.     Cervical back: Normal range of motion and neck supple.     Right lower leg: No tenderness. No edema.     Left lower leg: No tenderness. No edema.  Skin:    General: Skin is warm and dry.     Capillary Refill: Capillary refill takes less than 2 seconds.  Neurological:     General: No focal deficit present.     Mental Status: He is alert. Mental status is at baseline.  Psychiatric:        Mood and Affect: Mood is anxious (slightly).        Behavior: Behavior normal.     ED Results / Procedures / Treatments   Labs (all labs ordered are listed, but only abnormal results are displayed) Labs Reviewed  LIPASE, BLOOD - Abnormal; Notable for the following  components:      Result Value   Lipase 81 (*)    All other components within normal limits  COMPREHENSIVE METABOLIC PANEL - Abnormal; Notable for the following components:   Potassium 3.4 (*)  CO2 17 (*)    BUN <5 (*)    Calcium 8.1 (*)    AST 46 (*)    All other components within normal limits  CBC - Abnormal; Notable for the following components:   WBC 11.4 (*)    RBC 3.48 (*)    Hemoglobin 10.8 (*)    HCT 33.0 (*)    RDW 16.8 (*)    Platelets 145 (*)    All other components within normal limits  URINALYSIS, ROUTINE W REFLEX MICROSCOPIC - Abnormal; Notable for the following components:   Color, Urine COLORLESS (*)    All other components within normal limits  TROPONIN I (HIGH SENSITIVITY)  TROPONIN I (HIGH SENSITIVITY)    EKG NSR at 96 bpm, short PR interval, frequent ectopy, no STEMI  Radiology CT ABDOMEN PELVIS WO CONTRAST  Result Date: 05/28/2022 CLINICAL DATA:  Acute abdominal pain. EXAM: CT ABDOMEN AND PELVIS WITHOUT CONTRAST TECHNIQUE: Multidetector CT imaging of the abdomen and pelvis was performed following the standard protocol without IV contrast. RADIATION DOSE REDUCTION: This exam was performed according to the departmental dose-optimization program which includes automated exposure control, adjustment of the mA and/or kV according to patient size and/or use of iterative reconstruction technique. COMPARISON:  01/20/2022 FINDINGS: Lower chest: No acute abnormality. Hepatobiliary: No focal liver abnormality is seen. No gallstones, gallbladder wall thickening, or biliary dilatation. Pancreas: Dystrophic calcifications throughout the pancreas consistent with sequela of chronic pancreatitis. 2 cm cystic mass in the distal pancreatic body likely reflecting a small pseudocyst given the other findings. Spleen: Normal in size without focal abnormality. Adrenals/Urinary Tract: Adrenal glands are unremarkable. Kidneys are normal, without renal calculi, focal lesion, or  hydronephrosis. Bladder is unremarkable. Stomach/Bowel: Stomach is within normal limits. No evidence of bowel wall thickening, distention, or inflammatory changes. Fluid-filled loops of small bowel as can be seen with enteritis. Appendix is normal. Vascular/Lymphatic: Normal caliber abdominal aorta with mild atherosclerosis. No lymphadenopathy. Reproductive: Prostate is unremarkable. Other: No abdominal wall hernia or abnormality. No abdominopelvic ascites. Musculoskeletal: No acute osseous abnormality. No aggressive osseous lesion. Degenerative disease with disc height loss at L5-S1. IMPRESSION: 1. Fluid-filled loops of small bowel as can be seen with enteritis. 2. Otherwise, no acute abdominal or pelvic pathology. 3. Sequela of chronic pancreatitis with a 2 cm cystic mass in the distal pancreatic body likely reflecting a small pseudocyst. 4.  Aortic Atherosclerosis (ICD10-I70.0). Electronically Signed   By: Kathreen Devoid M.D.   On: 05/28/2022 10:07   DG Chest 2 View  Result Date: 05/28/2022 CLINICAL DATA:  Chest pain, dizziness, emesis. Elevated blood pressure. EXAM: CHEST - 2 VIEW COMPARISON:  05/21/2022. FINDINGS: The heart size and mediastinal contours are within normal limits. No consolidation, effusion, or pneumothorax. No acute osseous abnormality. IMPRESSION: No active cardiopulmonary disease. Electronically Signed   By: Brett Fairy M.D.   On: 05/28/2022 04:12    Procedures Procedures    Medications Ordered in ED Medications  potassium chloride SA (KLOR-CON M) CR tablet 40 mEq (40 mEq Oral Given 05/28/22 0924)  alum & mag hydroxide-simeth (MAALOX/MYLANTA) 200-200-20 MG/5ML suspension 30 mL (30 mLs Oral Given 05/28/22 0923)    And  lidocaine (XYLOCAINE) 2 % viscous mouth solution 15 mL (15 mLs Oral Given 05/28/22 0923)  sodium chloride 0.9 % bolus 1,000 mL (0 mLs Intravenous Stopped 05/28/22 1153)  ondansetron (ZOFRAN) injection 4 mg (4 mg Intravenous Given 05/28/22 0921)  morphine (PF) 4 MG/ML  injection 4 mg (4 mg Intravenous Given 05/28/22 1044)  morphine (PF) 2 MG/ML injection 2 mg (2 mg Intravenous Given 05/28/22 1153)    ED Course/ Medical Decision Making/ A&P                             Medical Decision Making Amount and/or Complexity of Data Reviewed Labs: ordered. Decision-making details documented in ED Course. Radiology: ordered. Decision-making details documented in ED Course. ECG/medicine tests: ordered. Decision-making details documented in ED Course.  Risk OTC drugs. Prescription drug management.   This is a 52 year old male presenting to ED for acute on chronic abdominal pain and chest pain, similar in characteristics to previous episodes of pancreatitis though self-reported to be more severe than in the past. Pt reports compliance with outpatient medication regimen. Diffuse upper abdominal tenderness. Differential for symptoms including but not limited to acute vs chronic pancreatitis, ACS, arrhythmia, gastritis, gastroenteritis, viral syndrome, electrolyte disturbance, AKI, UTI, low suspicion for acute abdomen with no rebound, guarding, or peritoneal signs. Pt has documented care plan due to history of frequent ED visits. He has not had a CT scan in several months though and with reported compliance with outpatient medication regimen, continued pain post GI cocktail that helped with symptoms at previous visit, proceeded with CT scan to rule out other acute intra-abdominal process. This scan showed chronic pancreatitis and likely some acute enteritis which is likely contributing to pt's acute worsening of pain. No acute surgical emergency, mild hypokalemia which was repleted. No UTI, minimally elevated lipase. Given IV fluids, IV Zofran, GI cocktail, and 2 doses of morphine during ED stay with good pain and nausea control. Pt requesting opiates to go home with. Multiple discussions with him that this is inappropriate to control his current symptoms. Will provide him with  Zofran as he is almost out in addition to GI cocktail to take at home for the next few days. Strongly recommended close outpatient follow up with PCP and GI specialist. Return precautions given. All questions answered and pt stable for discharge.          Final Clinical Impression(s) / ED Diagnoses Final diagnoses:  Other chronic pancreatitis ([place])  Enteritis  Hypokalemia    Rx / DC Orders ED Discharge Orders          Ordered    alum & mag hydroxide-simeth (MAALOX MAX) 657-846-96 MG/5ML suspension  2 times daily PRN        05/28/22 1142    lidocaine (XYLOCAINE) 2 % solution  2 times daily PRN        05/28/22 1142    ondansetron (ZOFRAN-ODT) 4 MG disintegrating tablet  Every 8 hours PRN        05/28/22 1142              Whittaker Lenis L, PA-C 29/52/84 1324    Lianne Cure, DO 40/10/27 [phone]

## 2022-05-28 NOTE — ED Triage Notes (Signed)
Patient arrived with EMS from home reports central chest pain this evening with dizziness/lightheaded and emesis , hypertensive BP= 200/112. He took Tylenol 1 gram prior to arrival .

## 2022-05-28 NOTE — ED Notes (Signed)
Patient transported to CT 

## 2022-05-28 NOTE — Discharge Instructions (Addendum)
Thank you for letting us take care of you today.  As discussed, your workup was consistent with chronic pancreatitis and also showed some fluid in your intestines (enteritis) likely due to a "stomach bug" which is usually caused by a virus. The remainder of your workup was reassuring. We controlled your pain and nausea in the ED. You should expect your symptoms to continue for the next 5-7 days as the virus runs its course. With your history of chronic pancreatitis, it is very important to avoid any alcohol consumption and make good dietary choices.  I have prescribed you the medication for a GI cocktail and Zofran for nausea as you requested. Please follow up with your primary care provider or GI specialist should your symptoms not start improving over the next few days. Take the nausea medication as prescribed for the next few days to get better control of your symptoms and be sure you are staying well hydrated with water or electrolyte replacement solutions such as Gatorade.  If you develop worsening symptoms, new symptoms, concern for dehydration, or other concerns, please be re-evaluated in nearest emergency department.

## 2022-06-01 NOTE — Progress Notes (Deleted)
Cardiology Office Note:    Date:  06/01/2022   ID:  [name], DOB 1970-08-26, MRN [id number]  PCP:  [name]   [facility] Providers Cardiologist:  Minus Breeding, MD Cardiology APP:  Ledora Bottcher, PA { Click to update primary MD,subspecialty MD or APP then REFRESH:1}    Referring MD: No ref. provider found   No chief complaint on file. ***  History of Present Illness:    [name] is a 52 y.o. male with a hx of WPW s/p ablation, substance use, alcohol abuse, and alcoholic pancreatitis.  Echocardiogram 2022 with mild concentric LVH.  He has been seen in the ER several times for chest and abdominal pain felt related to chronic pancreatitis.  No coronary disease by heart cath in 2014.  EKG in 2014 with preexcitation ST segment changes consistent with WPW.  Per patient report, he states he underwent WPW ablation at ?2014/2015.  He has done well until recently.  He reports having rapid heart rate sometimes in the 190s but mostly 135.  He was recently hospitalized at 12/06/2021 with chest and abdominal pain found to be in A-fib RVR.  He converted spontaneously.  Telemetry notable for some aberrant conduction.  Of note, he had been out of his Toprol for about a month prior to this evaluation.  Toprol was restarted at 100 mg daily.  He does have a history of cocaine use but was tolerating this beta-blocker in the past and is working on negative drug screens to get into pain clinic for pancreatitis.  Given his low CHA2DS2-VASc score, decision was made to not anticoagulate.  Echocardiogram with preserved EF and no significant valvular disease.  I saw him in follow up in Aug 2023 and he reported worsening CP with exertion. I opted to repeat a nuclear stress test (did not feel CT coronary would be his best option due to ectopy on EKG already on 100 mg toprol). Nuclear stress test 01/2022 was nonischemic, but reduced EF. Follow up limited echo showed LVEF 65-70% with  mild LVH, normal RV, and no significant valvular disease.   Unfortunately he presented back to the ER for chest pain and palpitations treated with adenosine by EMS. Trigger seems to be alcohol or abdominal pain with recurrent pancreatitis. Cardiology was consulted with plans for EP evaluation but he left he hospital AMA. He was seen by Dr. Percival Spanish in follow up on 02/14/22 with persistent Afib and elevated rates. He also reported ongoing chest pain felt to be atypical. Dr. Percival Spanish increased toprol to 200 mg daily. Since that visit, he has had multiple (?5) additional ER visits for chest pain and epigastric pain, no admissions. Pt noted to be bradycardic in the 30s on telemetry during one of these ER visits.   He presents today for routine follow up.   He has a 15% no show rate.     Chest pain Not angina in nature Nonischemic nuclear stress test 01/2022 and unremarkable echocardiogram Felt to be atypical   A-fib with RVR Heart monitor never completed Managed on 200 mg Toprol Not anticoagulated for CHA2DS2-VASc of 1 He has PRN toprol to use   History of WPW s/p ablation Now with persistent Afib   Hypertension Managed with 10 mg amlodipine, toprol   Substance abuse Drug seeking Continues to drink alcohol and smokes Seeking anti-anxiety or pain medications at last visit        Past Medical History:  Diagnosis Date   Alcoholism /alcohol abuse  Anemia    Anxiety    Arthritis    knees; arms; elbows (03/26/2015)   Asthma    Bipolar disorder (HCC)    Chronic bronchitis (HCC)    Chronic lower back pain    Chronic pancreatitis ([place])    Cocaine abuse ([place])    Depression    Family history of adverse reaction to anesthesia    Femoral condyle fracture ([place]) 03/08/2014   left medial/notes 03/09/2014   GERD (gastroesophageal reflux disease)    H/O hiatal hernia    H/O suicide attempt 10/2012   High cholesterol    History of blood transfusion 10/2012   when I tried to  commit suicide   History of stomach ulcers    Hypertension    Marijuana abuse, continuous    Migraine    a few times/year (03/26/2015)   Pneumonia 1990's X 3   PTSD (post-traumatic stress disorder)    Sickle cell trait ([place])    WPW (Wolff-Parkinson-White syndrome)    [name] 03/06/2013    Past Surgical History:  Procedure Laterality Date   BIOPSY  11/25/2017   Procedure: BIOPSY;  Surgeon: Arta Silence, MD;  Location: [place];  Service: Endoscopy;;   BIOPSY  10/14/2018   Procedure: BIOPSY;  Surgeon: Arta Silence, MD;  Location: [place];  Service: Endoscopy;;   CARDIAC CATHETERIZATION     CYST ENTEROSTOMY  01/02/2020   Procedure: CYST ASPIRATION;  Surgeon: Milus Banister, MD;  [facility];  Service: Endoscopy;;   ESOPHAGOGASTRODUODENOSCOPY (EGD) WITH PROPOFOL N/A 11/25/2017   Procedure: ESOPHAGOGASTRODUODENOSCOPY (EGD) WITH PROPOFOL;  Surgeon: Arta Silence, MD;  Location[facility] ENDOSCOPY;  Service: Endoscopy;  Laterality: N/A;   ESOPHAGOGASTRODUODENOSCOPY (EGD) WITH PROPOFOL Left 10/14/2018   Procedure: ESOPHAGOGASTRODUODENOSCOPY (EGD) WITH PROPOFOL;  Surgeon: Arta Silence, MD;  Location: [facility] ENDOSCOPY;  Service: Endoscopy;  Laterality: Left;   ESOPHAGOGASTRODUODENOSCOPY (EGD) WITH PROPOFOL N/A 11/14/2018   Procedure: ESOPHAGOGASTRODUODENOSCOPY (EGD) WITH PROPOFOL;  Surgeon: Laurence Spates, MD;  [facility];  Service: Gastroenterology;  Laterality: N/A;   ESOPHAGOGASTRODUODENOSCOPY (EGD) WITH PROPOFOL N/A 01/02/2020   Procedure: ESOPHAGOGASTRODUODENOSCOPY (EGD) WITH PROPOFOL;  Surgeon: Milus Banister, MD;  [facility];  Service: Endoscopy;  Laterality: N/A;   ESOPHAGOGASTRODUODENOSCOPY (EGD) WITH PROPOFOL N/A 10/25/2020   Procedure: ESOPHAGOGASTRODUODENOSCOPY (EGD) WITH PROPOFOL;  Surgeon: Rush Landmark Telford Nab., MD;  Location: [place];  Service: Gastroenterology;  Laterality: N/A;   EUS N/A 01/02/2020   Procedure: UPPER ENDOSCOPIC  ULTRASOUND (EUS) RADIAL;  Surgeon: Milus Banister, MD;  [facility];  Service: Endoscopy;  Laterality: N/A;   EYE SURGERY Left 1990's   "result of trauma"    FACIAL FRACTURE SURGERY Left 1990's   "result of trauma"    FLEXIBLE SIGMOIDOSCOPY N/A 10/25/2020   Procedure: FLEXIBLE SIGMOIDOSCOPY;  Surgeon: Irving Copas., MD;  Location: [place];  Service: Gastroenterology;  Laterality: N/A;   FRACTURE SURGERY     HEMOSTASIS CLIP PLACEMENT  10/25/2020   Procedure: HEMOSTASIS CLIP PLACEMENT;  Surgeon: Irving Copas., MD;  Location: [place];  Service: Gastroenterology;;   HERNIA REPAIR     LEFT HEART CATHETERIZATION WITH CORONARY ANGIOGRAM Right 03/07/2013   Procedure: LEFT HEART CATHETERIZATION WITH CORONARY ANGIOGRAM;  Surgeon: [name], MD;  Location: [place] CATH LAB;  Service: Cardiovascular;  Laterality: Right;   UMBILICAL HERNIA REPAIR     UPPER GASTROINTESTINAL ENDOSCOPY      Current Medications: No outpatient medications have been marked as taking for the 06/14/22 encounter (Appointment) with Ledora Bottcher,  PA.     Allergies:   Robaxin [methocarbamol], Aspirin, Shellfish-derived products, Trazodone and nefazodone, Adhesive [tape], Latex, Toradol [ketorolac tromethamine], Contrast media [iodinated contrast media], Reglan [metoclopramide], and [place] [fish oil]   Social History   Socioeconomic History   Marital status: Single    Spouse name: Not on file   Number of children: Not on file   Years of education: Not on file   Highest education level: Not on file  Occupational History   Occupation: disabled  Tobacco Use   Smoking status: Every Day    Packs/day: 1.00    Years: 36.00    Total pack years: 36.00    Types: Cigarettes, E-cigarettes   Smokeless tobacco: Never  Vaping Use   Vaping Use: Former  Substance and Sexual Activity   Alcohol use: Not Currently    Comment: last drink a few days ago   Drug use: Not Currently    Types:  Marijuana, Cocaine   Sexual activity: Yes    Birth control/protection: None  Other Topics Concern   Not on file  Social History Narrative   ** Merged History Encounter **       Social Determinants of Health   Financial Resource Strain: Not on file  Food Insecurity: Not on file  Transportation Needs: Not on file  Physical Activity: Not on file  Stress: Not on file  Social Connections: Not on file     Family History: The patient's ***family history includes Alcoholism in his father; Cirrhosis in his father; Coronary artery disease in an other family member; Hypertension in his father, mother, and another family member; Melanoma in his father.  ROS:   Please see the history of present illness.    *** All other systems reviewed and are negative.  EKGs/Labs/Other Studies Reviewed:    The following studies were reviewed today: ***  EKG:  EKG is *** ordered today.  The ekg ordered today demonstrates ***  Recent Labs: 01/20/2022: B Natriuretic Peptide 116.7; TSH 2.161 03/14/2022: Magnesium 0.5 05/28/2022: ALT 37; BUN <5; Creatinine, Ser 0.72; Hemoglobin 10.8; Platelets 145; Potassium 3.4; Sodium 139  Recent Lipid Panel    Component Value Date/Time   CHOL 175 10/30/2020 0545   TRIG 142 06/07/2021 0239   HDL 89 10/30/2020 0545   CHOLHDL 2.0 10/30/2020 0545   VLDL 8 10/30/2020 0545   LDLCALC 78 10/30/2020 0545     Risk Assessment/Calculations:   {Does this patient have ATRIAL FIBRILLATION?[phone]}  No BP recorded.  {Refresh Note OR Click here to enter BP  :1}***         Physical Exam:    VS:  There were no vitals taken for this visit.    Wt Readings from Last 3 Encounters:  05/21/22 115 lb (52.2 kg)  03/15/22 115 lb (52.2 kg)  02/17/22 123 lb 7.3 oz (56 kg)     GEN: *** Well nourished, well developed in no acute distress HEENT: Normal NECK: No JVD; No carotid bruits LYMPHATICS: No lymphadenopathy CARDIAC: ***RRR, no murmurs, rubs, gallops RESPIRATORY:   Clear to auscultation without rales, wheezing or rhonchi  ABDOMEN: Soft, non-tender, non-distended MUSCULOSKELETAL:  No edema; No deformity  SKIN: Warm and dry NEUROLOGIC:  Alert and oriented x 3 PSYCHIATRIC:  Normal affect   ASSESSMENT:    No diagnosis found. PLAN:    In order of problems listed above:  ***      {Are you ordering a CV Procedure (e.g. stress test, cath, DCCV, TEE, etc)?   Press  F2        [id number]    Medication Adjustments/Labs and Tests Ordered: Current medicines are reviewed at length with the patient today.  Concerns regarding medicines are outlined above.  No orders of the defined types were placed in this encounter.  No orders of the defined types were placed in this encounter.   There are no Patient Instructions on file for this visit.   Signed, Ledora Bottcher, PA  06/01/2022 10:20 AM    [place]

## 2022-06-06 ENCOUNTER — Ambulatory Visit: Payer: Medicaid Other | Admitting: Physical Therapy

## 2022-06-06 ENCOUNTER — Other Ambulatory Visit: Payer: Self-pay

## 2022-06-06 ENCOUNTER — Encounter: Payer: Self-pay | Admitting: Physical Therapy

## 2022-06-06 DIAGNOSIS — M6281 Muscle weakness (generalized): Secondary | ICD-10-CM

## 2022-06-06 DIAGNOSIS — M5459 Other low back pain: Secondary | ICD-10-CM | POA: Diagnosis present

## 2022-06-06 DIAGNOSIS — M5416 Radiculopathy, lumbar region: Secondary | ICD-10-CM | POA: Diagnosis present

## 2022-06-06 NOTE — Therapy (Signed)
OUTPATIENT PHYSICAL THERAPY EVALUATION   Patient Name: [name] MRN: [id number] DOB:1971-03-09, 52 y.o., male Today's Date: 06/06/2022   END OF SESSION:  PT End of Session - 06/06/22 1059     Visit Number 1    Number of Visits 9    Date for PT Re-Evaluation 08/01/22    Authorization Type MCD    PT Start Time 1100    PT Stop Time 4765    PT Time Calculation (min) 45 min    Activity Tolerance Patient limited by pain    Behavior During Therapy [facility] for tasks assessed/performed             Past Medical History:  Diagnosis Date   Alcoholism /alcohol abuse    Anemia    Anxiety    Arthritis    knees; arms; elbows (03/26/2015)   Asthma    Bipolar disorder ([place])    Chronic bronchitis ([place])    Chronic lower back pain    Chronic pancreatitis ([place])    Cocaine abuse ([place])    Depression    Family history of adverse reaction to anesthesia    Femoral condyle fracture ([place]) 03/08/2014   left medial/notes 03/09/2014   GERD (gastroesophageal reflux disease)    H/O hiatal hernia    H/O suicide attempt 10/2012   High cholesterol    History of blood transfusion 10/2012   when I tried to commit suicide   History of stomach ulcers    Hypertension    Marijuana abuse, continuous    Migraine    a few times/year (03/26/2015)   Pneumonia 1990's X 3   PTSD (post-traumatic stress disorder)    Sickle cell trait ([place])    WPW (Wolff-Parkinson-White syndrome)    [name] 03/06/2013   Past Surgical History:  Procedure Laterality Date   BIOPSY  11/25/2017   Procedure: BIOPSY;  Surgeon: Arta Silence, MD;  Location: [place];  Service: Endoscopy;;   BIOPSY  10/14/2018   Procedure: BIOPSY;  Surgeon: Arta Silence, MD;  Location: [place];  Service: Endoscopy;;   CARDIAC CATHETERIZATION     CYST ENTEROSTOMY  01/02/2020   Procedure: CYST ASPIRATION;  Surgeon: Milus Banister, MD;  [facility];  Service: Endoscopy;;   ESOPHAGOGASTRODUODENOSCOPY (EGD) WITH  PROPOFOL N/A 11/25/2017   Procedure: ESOPHAGOGASTRODUODENOSCOPY (EGD) WITH PROPOFOL;  Surgeon: Arta Silence, MD;  Location[facility] ENDOSCOPY;  Service: Endoscopy;  Laterality: N/A;   ESOPHAGOGASTRODUODENOSCOPY (EGD) WITH PROPOFOL Left 10/14/2018   Procedure: ESOPHAGOGASTRODUODENOSCOPY (EGD) WITH PROPOFOL;  Surgeon: Arta Silence, MD;  Location[facility] ENDOSCOPY;  Service: Endoscopy;  Laterality: Left;   ESOPHAGOGASTRODUODENOSCOPY (EGD) WITH PROPOFOL N/A 11/14/2018   Procedure: ESOPHAGOGASTRODUODENOSCOPY (EGD) WITH PROPOFOL;  Surgeon: Laurence Spates, MD;  [facility];  Service: Gastroenterology;  Laterality: N/A;   ESOPHAGOGASTRODUODENOSCOPY (EGD) WITH PROPOFOL N/A 01/02/2020   Procedure: ESOPHAGOGASTRODUODENOSCOPY (EGD) WITH PROPOFOL;  Surgeon: Milus Banister, MD;  [facility];  Service: Endoscopy;  Laterality: N/A;   ESOPHAGOGASTRODUODENOSCOPY (EGD) WITH PROPOFOL N/A 10/25/2020   Procedure: ESOPHAGOGASTRODUODENOSCOPY (EGD) WITH PROPOFOL;  Surgeon: Rush Landmark Telford Nab., MD;  Location: [place];  Service: Gastroenterology;  Laterality: N/A;   EUS N/A 01/02/2020   Procedure: UPPER ENDOSCOPIC ULTRASOUND (EUS) RADIAL;  Surgeon: Milus Banister, MD;  [facility];  Service: Endoscopy;  Laterality: N/A;   EYE SURGERY Left 1990's   "result of trauma"    FACIAL FRACTURE SURGERY Left 1990's   "result of trauma"    FLEXIBLE SIGMOIDOSCOPY N/A 10/25/2020   Procedure: FLEXIBLE  SIGMOIDOSCOPY;  Surgeon: Irving Copas., MD;  Location: [place];  Service: Gastroenterology;  Laterality: N/A;   FRACTURE SURGERY     HEMOSTASIS CLIP PLACEMENT  10/25/2020   Procedure: HEMOSTASIS CLIP PLACEMENT;  Surgeon: Irving Copas., MD;  Location: [place];  Service: Gastroenterology;;   HERNIA REPAIR     LEFT HEART CATHETERIZATION WITH CORONARY ANGIOGRAM Right 03/07/2013   Procedure: LEFT HEART CATHETERIZATION WITH CORONARY ANGIOGRAM;  Surgeon: [name], MD;   Location: [place] CATH LAB;  Service: Cardiovascular;  Laterality: Right;   UMBILICAL HERNIA REPAIR     UPPER GASTROINTESTINAL ENDOSCOPY     Patient Active Problem List   Diagnosis Date Noted   NSVT (nonsustained ventricular tachycardia) ([place]) 01/20/2022   Normal anion gap metabolic acidosis 07/37/1062   Hypoalbuminemia 12/08/2021   Acute recurrent pancreatitis 12/07/2021   WPW (Wolff-Parkinson-White syndrome) 12/06/2021   Paroxysmal atrial fibrillation with RVR ([place]) 12/06/2021   Pulmonary nodule 12/06/2021   Leukocytosis 06/07/2021   Epigastric pain 69/48/5462   Acute alcoholic pancreatitis 70/35/0093   Urinary retention    Protein-calorie malnutrition, severe 11/17/2020   Acute pancreatitis 11/15/2020   Hypomagnesemia    Hypocalcemia 10/23/2020   AKI (acute kidney injury) ([place]) 11/13/2018   Seizure ([place]) 11/13/2018   Chest pain 01/08/2018   Acute on chronic pancreatitis ([place]) 09/28/2017   Abdominal pain 05/27/2017   Nausea & vomiting 03/18/2017   Diarrhea 03/18/2017   Gastroesophageal reflux disease    Normocytic anemia 12/05/2016   Alcohol use disorder, severe, dependence ([place]) 07/25/2016   Cocaine use disorder, severe, dependence ([place]) 07/25/2016   Major depressive disorder, 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/20/2016   Chronic pancreatitis ([place]) 05/18/2015   Essential hypertension 02/06/2014   Mood disorder ([place]) 02/06/2014   Pancreatic pseudocyst/cyst 11/25/2013   [name] White pattern seen on electrocardiogram 10/03/2012   Tobacco abuse 03/23/2007    PCP: None  REFERRING PROVIDER: Renette Butters, MD   REFERRING DIAG: Lumbago, Radiculopathy   Rationale for Evaluation and Treatment: Rehabilitation  THERAPY DIAG:  Other low back pain  Muscle weakness (generalized)  ONSET DATE: Ongoing about 7 months   SUBJECTIVE:       SUBJECTIVE STATEMENT: Patient reports right sided low back, hip, and groin pain. He states he can't sleep on the  right side, mostly hurts when walking or going up and down stairs. Patient states he can walk 3-5 minutes before he has to sit down due to pain. No mechanism to start the pain, gradually came on and worsened.   PERTINENT HISTORY:  Alcohol abuse, bipolar disorder, chronic pancreatitis, polysubstance abuse, hyperlipidemia, hypertension; see additional PMH above  PAIN:  Are you having pain? Yes:  NPRS scale: 7/10 Pain location: low back , right LE to anterior thigh, N/T in groin Pain description: Achy, sharp, N/T Aggravating factors: Right side lying, walking 3-5 minutes, stairs Relieving factors: sitting, pain cream/ scrub, hot bath, epsom salt  PRECAUTIONS: None  WEIGHT BEARING RESTRICTIONS: No  FALLS:  Has patient fallen in last 6 months? No  LIVING ENVIRONMENT: Lives with: lives with their family Lives in: House/apartment Stairs: Yes: Internal: 8 steps; can reach both and External: 9 steps; can reach both  OCCUPATION: Disability  PLOF: Independent  PATIENT GOALS: Get rid of pain   OBJECTIVE:  DIAGNOSTIC FINDINGS:  Lumbar MRI 05/21/2022 IMPRESSION: 1. No acute abnormality within the lumbar spine. No evidence for cord compression. 2. Small central to left subarticular disc protrusion at L5-S1, potentially affecting the descending left  S1 nerve root. 3. Small central disc protrusion at L4-5 without significant stenosis or impingement. 4. Mild bilateral L3 and L5 foraminal stenosis related to disc bulge and facet hypertrophy.  PATIENT SURVEYS:  Modified Oswestry 72% disability (36/50)   SCREENING FOR RED FLAGS: Negative - previous red flag symptoms on 05/21/2022 ruled out via MRI  COGNITION: Overall cognitive status: Within functional limits for tasks assessed     SENSATION: WFL  MUSCLE LENGTH: Patient demonstrates limitations in hamstring and piriformis flexibility  POSTURE:   Rounded shoulders, decreased lumbar lordosis  PALPATION: Tender to palpation  bilateral lumbar paraspinals, right gluteal regon  LUMBAR ROM:   AROM eval  Flexion WFL  Extension WFL  Right lateral flexion WFL  Left lateral flexion WFL  Right rotation WFL  Left rotation WFL   (Blank rows = not tested) Patient reports right low back pulling with right side bend  LOWER EXTREMITY ROM:      LE ROM grossly WFL  LOWER EXTREMITY MMT:    MMT Right eval Left eval  Hip flexion 4- 4-  Hip extension 3+ 4-  Hip abduction 3+ 4-  Hip adduction    Hip internal rotation    Hip external rotation    Knee flexion 4 4  Knee extension 4+ 5  Ankle dorsiflexion 5 5  Ankle plantarflexion    Ankle inversion    Ankle eversion 4 4   (Blank rows = not tested)   Myotomes testing grossly WFL  LUMBAR SPECIAL TESTS:  Straight leg raise test: Negative  FUNCTIONAL TESTS:  Not assessed  GAIT: Assistive device utilized: None Level of assistance: Complete Independence Comments: mildly antalgic on right   TODAY'S TREATMENT:        [facility] Adult PT Treatment:                                                DATE: 06/06/2022 Therapeutic Exercise: Supine piriformis pull/push stretch 2 x 30 sec each Bridge x 10 SLR x 10 Sidelying hip abduction x 10 Seated hamstring stretch 2 x 30 sec each  PATIENT EDUCATION:  Education details: Exam findings, POC, HEP Person educated: Patient Education method: Explanation, Demonstration, Tactile cues, Verbal cues, and Handouts Education comprehension: verbalized understanding, returned demonstration, verbal cues required, tactile cues required, and needs further education  HOME EXERCISE PROGRAM: Access Code: [email or web address]    ASSESSMENT: CLINICAL IMPRESSION: Patient is a 52 y.o. male who was seen today for physical therapy evaluation and treatment for chronic low back and right sided hip pain. His primarily impairments this visit included flexibility deficits of the hips and LE, and gross strength deficits of the core, hips, and LE likely  contributing to his pain. He exhibits good lumbar motion this visit and no obvious radicular symptoms with testing.  OBJECTIVE IMPAIRMENTS: decreased activity tolerance, decreased ROM, decreased strength, impaired flexibility, and pain.   ACTIVITY LIMITATIONS: lifting, standing, sleeping, stairs, and locomotion level  PARTICIPATION LIMITATIONS: meal prep, cleaning, laundry, community activity, and occupation  PERSONAL FACTORS: Behavior pattern, Fitness, Past/current experiences, Social background, Time since onset of injury/illness/exacerbation, and 3+ comorbidities: see additional PMH above  are also affecting patient's functional outcome.   REHAB POTENTIAL: Good  CLINICAL DECISION MAKING: Stable/uncomplicated  EVALUATION COMPLEXITY: Low   GOALS: Goals reviewed with patient? Yes  SHORT TERM GOALS: Target date: 07/04/2022  Patient will be I with  initial HEP in order to progress with therapy. Baseline: HEP provided at eval Goal status: INITIAL  2.  Patient will ability to walk >/= 10 minutes without having to sit due to pain in order to improve community access Baseline: 3-5 minutes Goal status: INITIAL  3.  Patient will report low back and right hip pain </= 4/10 in order to reduce functional limitations Baseline: 7/10 pain Goal status: INITIAL  LONG TERM GOALS: Target date: 08/01/2022  Patient will be I with final HEP to maintain progress from PT. Baseline: HEP provided at eval Goal status: INITIAL  2.  Patient will report modified ODI </= % disability in order to indiciate improve in his functional ability with tasks such as lifting, household activities, standing, and walking Baseline: % disability Goal status: INITIAL  3.  Patient will demonstrate right hip strength grossly >/= 4/5 MMT in order to improve walking tolerance and stair negotiation Baseline: right hip strength grossly 3+/5 MMT Goal status: INITIAL  4.  Patient will report ability to walk >/= 30 minutes  with needing rest break to improve ability to go grocery shopping or walking for exercise Baseline: 3-5 min at eval Goal status: INITIAL  5.  Patient will report low back and right hip pain </= 2/10 in order to reduce functional limitations Baseline: 7/10 at eval Goal status: INITIAL   PLAN: PT FREQUENCY: 1x/week  PT DURATION: 8 weeks  PLANNED INTERVENTIONS: Therapeutic exercises, Therapeutic activity, Neuromuscular re-education, Balance training, Gait training, Patient/Family education, Self Care, Joint mobilization, Joint manipulation, Aquatic Therapy, Dry Needling, Electrical stimulation, Spinal manipulation, Spinal mobilization, Cryotherapy, Moist heat, Ionotophoresis '4mg'$ /ml Dexamethasone, Manual therapy, and Re-evaluation.  PLAN FOR NEXT SESSION: Review HEP and progress PRN, stretching for hip and LE, progress core, hip and LE strengthening   Hilda Blades, PT, DPT, LAT, ATC 06/06/22  4:37 PM Phone: [phone] Fax: [phone]    Check all possible CPT codes: [place] - PT Re-evaluation, [address]10- Therapeutic Exercise, [phone]- Neuro Re-education, [phone] - Gait Training, [phone] - Manual Therapy, [phone] - Therapeutic Activities, [phone] - [place], and [phone] - Aquatic therapy    Check all conditions that are expected to impact treatment: Musculoskeletal disorders and Social determinants of health   If treatment provided at initial evaluation, no treatment charged due to lack of authorization.

## 2022-06-06 NOTE — Patient Instructions (Signed)
Access Code: [email or web address] URL: [email or web address] Date: 06/06/2022 Prepared by: Hilda Blades  Exercises - Supine Piriformis Stretch with Foot on Ground  - 2 x daily - 3 reps - 30 seconds hold - Supine Figure 4 Piriformis Stretch  - 2 x daily - 3 reps - 30 seconds hold - Supine Bridge  - 1 x daily - 3 sets - 10 reps - Supine Active Straight Leg Raise  - 1 x daily - 3 sets - 10 reps - Sidelying Hip Abduction  - 1 x daily - 3 sets - 10 reps - Seated Hamstring Stretch  - 2 x daily - 3 reps - 30 seconds hold

## 2022-06-07 ENCOUNTER — Telehealth: Payer: Self-pay | Admitting: Gastroenterology

## 2022-06-07 NOTE — Telephone Encounter (Signed)
Inbound call from patient, states he went to the ED last week for abdominal pain and nausea. Patient stated they prescribed Lidocaine and he is out. Patient would like medication resent to pharmacy. Please advise. Patient states we are unable to refill the medication he will find another provider.

## 2022-06-07 NOTE — Telephone Encounter (Signed)
Patient is calling back to check up on his medication

## 2022-06-07 NOTE — Telephone Encounter (Signed)
Can you clarify exactly what he needs? I see the ED gave him lidocaine (XYLOCAINE) 2 % solution  2 times daily PRN. He should also be on protonix and carafate. If he feels the viscous lidocaine helps him can give him one refill but would need to know why he is taking this. Thanks

## 2022-06-08 MED ORDER — LIDOCAINE VISCOUS HCL 2 % MT SOLN: 15.0000 mL | Freq: Two times a day (BID) | OROMUCOSAL | 1 refills | Status: DC | PRN

## 2022-06-08 NOTE — Telephone Encounter (Signed)
Refill for lidocaine solution 2% sent to pharmacy. 1 bottle with a refill. No further refills without an appointment per SA

## 2022-06-08 NOTE — Telephone Encounter (Signed)
Called and spoke to patient. He indicated he had taken that for "abdominal burning".  He said the Protonix and the Carafate aren't helping that well anymore.

## 2022-06-08 NOTE — Telephone Encounter (Signed)
Okay, this is not a condition where I would give chronic lidocaine. Can give him a short course with a refill if he feels it helps him acutely, not would not give long term course

## 2022-06-13 ENCOUNTER — Telehealth: Payer: Self-pay | Admitting: Physical Therapy

## 2022-06-13 ENCOUNTER — Ambulatory Visit: Payer: Medicaid Other | Attending: Orthopedic Surgery | Admitting: Physical Therapy

## 2022-06-13 DIAGNOSIS — M6281 Muscle weakness (generalized): Secondary | ICD-10-CM | POA: Insufficient documentation

## 2022-06-13 DIAGNOSIS — M5459 Other low back pain: Secondary | ICD-10-CM | POA: Insufficient documentation

## 2022-06-13 NOTE — Telephone Encounter (Signed)
Contacted patient regarding no show to appointment this morning. He was actually on his way to the clinic but would be too late for his scheduled appointment. Offered a later time however patient opted to schedule on a day later in the week. He was advised of the attendance policy.

## 2022-06-14 ENCOUNTER — Ambulatory Visit: Payer: Medicaid Other | Attending: Physician Assistant | Admitting: Physician Assistant

## 2022-06-16 ENCOUNTER — Encounter: Payer: Self-pay | Admitting: Physical Therapy

## 2022-06-16 ENCOUNTER — Ambulatory Visit: Payer: Medicaid Other | Admitting: Physical Therapy

## 2022-06-16 ENCOUNTER — Other Ambulatory Visit: Payer: Self-pay

## 2022-06-16 DIAGNOSIS — M6281 Muscle weakness (generalized): Secondary | ICD-10-CM | POA: Diagnosis present

## 2022-06-16 DIAGNOSIS — M5459 Other low back pain: Secondary | ICD-10-CM | POA: Diagnosis present

## 2022-06-16 NOTE — Therapy (Signed)
OUTPATIENT PHYSICAL THERAPY TREATMENT NOTE   Patient Name: [name] MRN: [id number] DOB:February 20, 1971, 52 y.o., male Today's Date: 06/16/2022  PCP: None REFERRING PROVIDER: Renette Butters, MD    END OF SESSION:   PT End of Session - 06/16/22 1431     Visit Number 2    Number of Visits 9    Date for PT Re-Evaluation 08/01/22    Authorization Type MCD    Authorization Time Period 06/13/2022 - 2/25.2024    Authorization - Visit Number 1    Authorization - Number of Visits 3    PT Start Time 1601    PT Stop Time 0932    PT Time Calculation (min) 40 min    Activity Tolerance Patient tolerated treatment well    Behavior During Therapy [facility] for tasks assessed/performed              Past Medical History:  Diagnosis Date   Alcoholism /alcohol abuse    Anemia    Anxiety    Arthritis    knees; arms; elbows (03/26/2015)   Asthma    Bipolar disorder ([place])    Chronic bronchitis ([place])    Chronic lower back pain    Chronic pancreatitis ([place])    Cocaine abuse ([place])    Depression    Family history of adverse reaction to anesthesia    Femoral condyle fracture ([place]) 03/08/2014   left medial/notes 03/09/2014   GERD (gastroesophageal reflux disease)    H/O hiatal hernia    H/O suicide attempt 10/2012   High cholesterol    History of blood transfusion 10/2012   when I tried to commit suicide   History of stomach ulcers    Hypertension    Marijuana abuse, continuous    Migraine    a few times/year (03/26/2015)   Pneumonia 1990's X 3   PTSD (post-traumatic stress disorder)    Sickle cell trait ([place])    WPW (Wolff-Parkinson-White syndrome)    [name] 03/06/2013   Past Surgical History:  Procedure Laterality Date   BIOPSY  11/25/2017   Procedure: BIOPSY;  Surgeon: Arta Silence, MD;  Location: [place];  Service: Endoscopy;;   BIOPSY  10/14/2018   Procedure: BIOPSY;  Surgeon: Arta Silence, MD;  Location: [place];  Service: Endoscopy;;   CARDIAC  CATHETERIZATION     CYST ENTEROSTOMY  01/02/2020   Procedure: CYST ASPIRATION;  Surgeon: Milus Banister, MD;  Location: [facility] ENDOSCOPY;  Service: Endoscopy;;   ESOPHAGOGASTRODUODENOSCOPY (EGD) WITH PROPOFOL N/A 11/25/2017   Procedure: ESOPHAGOGASTRODUODENOSCOPY (EGD) WITH PROPOFOL;  Surgeon: Arta Silence, MD;  Location[facility] ENDOSCOPY;  Service: Endoscopy;  Laterality: N/A;   ESOPHAGOGASTRODUODENOSCOPY (EGD) WITH PROPOFOL Left 10/14/2018   Procedure: ESOPHAGOGASTRODUODENOSCOPY (EGD) WITH PROPOFOL;  Surgeon: Arta Silence, MD;  Location: [facility] ENDOSCOPY;  Service: Endoscopy;  Laterality: Left;   ESOPHAGOGASTRODUODENOSCOPY (EGD) WITH PROPOFOL N/A 11/14/2018   Procedure: ESOPHAGOGASTRODUODENOSCOPY (EGD) WITH PROPOFOL;  Surgeon: [place] Spates, MD;  Location: [facility] ENDOS[facility];  Service: Gastroenterology;  Laterality: N/A;   ESOPHAGOGASTRODUODENOSCOPY (EGD) WITH PROPOFOL N/A 01/02/2020   Procedure: ESOPHAGOGASTRODUODENOSCOPY (EGD) WITH PROPOFOL;  Surgeon: Milus Banister, MD;  Location: [facility] ENDOS[facility];  Service: Endoscopy;  Laterality: N/A;   ESOPHAGOGASTRODUODENOSCOPY (EGD) WITH PROPOFOL N/A 10/25/2020   Procedure: ESOPHAGOGASTRODUODENOSCOPY (EGD) WITH PROPOFOL;  Surgeon: Rush Landmark Telford Nab., MD;  Location: [place];  Service: Gastroenterology;  Laterality: N/A;   EUS N/A 01/02/2020   Procedure: UPPER ENDOSCOPIC ULTRASOUND (EUS) RADIAL;  Surgeon: Milus Banister, MD;  Location: [facility] ENDOSCOPY;  Service:  Endoscopy;  Laterality: N/A;   EYE SURGERY Left 1990's   "result of trauma"    FACIAL FRACTURE SURGERY Left 1990's   "result of trauma"    FLEXIBLE SIGMOIDOSCOPY N/A 10/25/2020   Procedure: FLEXIBLE SIGMOIDOSCOPY;  Surgeon: Irving Copas., MD;  Location: [place];  Service: Gastroenterology;  Laterality: N/A;   FRACTURE SURGERY     HEMOSTASIS CLIP PLACEMENT  10/25/2020   Procedure: HEMOSTASIS CLIP PLACEMENT;  Surgeon: Irving Copas., MD;  Location: [place];  Service:  Gastroenterology;;   HERNIA REPAIR     LEFT HEART CATHETERIZATION WITH CORONARY ANGIOGRAM Right 03/07/2013   Procedure: LEFT HEART CATHETERIZATION WITH CORONARY ANGIOGRAM;  Surgeon: [name], MD;  Location: [place] CATH LAB;  Service: Cardiovascular;  Laterality: Right;   UMBILICAL HERNIA REPAIR     UPPER GASTROINTESTINAL ENDOSCOPY     Patient Active Problem List   Diagnosis Date Noted   NSVT (nonsustained ventricular tachycardia) ([place]) 01/20/2022   Normal anion gap metabolic acidosis 10/62/6948   Hypoalbuminemia 12/08/2021   Acute recurrent pancreatitis 12/07/2021   WPW (Wolff-Parkinson-White syndrome) 12/06/2021   Paroxysmal atrial fibrillation with RVR ([place]) 12/06/2021   Pulmonary nodule 12/06/2021   Leukocytosis 06/07/2021   Epigastric pain 54/62/7035   Acute alcoholic pancreatitis 00/93/8182   Urinary retention    Protein-calorie malnutrition, severe 11/17/2020   Acute pancreatitis 11/15/2020   Hypomagnesemia    Hypocalcemia 10/23/2020   AKI (acute kidney injury) ([place]) 11/13/2018   Seizure ([place]) 11/13/2018   Chest pain 01/08/2018   Acute on chronic pancreatitis ([place]) 09/28/2017   Abdominal pain 05/27/2017   Nausea & vomiting 03/18/2017   Diarrhea 03/18/2017   Gastroesophageal reflux disease    Normocytic anemia 12/05/2016   Alcohol use disorder, severe, dependence ([place]) 07/25/2016   Cocaine use disorder, severe, dependence ([place]) 07/25/2016   Major depressive disorder, recurrent severe without psychotic features ([place]) 07/20/2016   Chronic pancreatitis ([place]) 05/18/2015   Essential hypertension 02/06/2014   Mood disorder ([place]) 02/06/2014   Pancreatic pseudocyst/cyst 11/25/2013   [name] White pattern seen on electrocardiogram 10/03/2012   Tobacco abuse 03/23/2007    REFERRING DIAG: Lumbago, Radiculopathy    THERAPY DIAG:  Other low back pain  Muscle weakness (generalized)  Rationale for Evaluation and Treatment Rehabilitation  PERTINENT HISTORY: Alcohol  abuse, bipolar disorder, chronic pancreatitis, polysubstance abuse, hyperlipidemia, hypertension; see additional PMH above   PRECAUTIONS: None    SUBJECTIVE:         SUBJECTIVE STATEMENT:  Patient reports he is doing ok. States that yesterday he felt a few sharp pains across his back yesterday with walking, but today is feeling better.  PAIN:  Are you having pain? Yes:  NPRS scale: 5/10 Pain location: Low back Pain description: Achy, sharp Aggravating factors: Right side lying, walking 3-5 minutes, stairs Relieving factors: Sitting, pain cream/ scrub, hot bath, epsom salt   OBJECTIVE: (objective measures completed at initial evaluation unless otherwise dated) PATIENT SURVEYS:  Modified Oswestry 72% disability (36/50)   MUSCLE LENGTH: Patient demonstrates limitations in hamstring and piriformis flexibility   POSTURE:             Rounded shoulders, decreased lumbar lordosis   PALPATION: Tender to palpation bilateral lumbar paraspinals, right gluteal regon   LUMBAR ROM:    AROM eval  Flexion WFL  Extension WFL  Right lateral flexion WFL  Left lateral flexion WFL  Right rotation WFL  Left rotation WFL   (Blank rows = not tested) Patient reports right low back pulling  with right side bend  LOWER EXTREMITY MMT:     MMT Right eval Left eval  Hip flexion 4- 4-  Hip extension 3+ 4-  Hip abduction 3+ 4-  Hip adduction      Hip internal rotation      Hip external rotation      Knee flexion 4 4  Knee extension 4+ 5  Ankle dorsiflexion 5 5  Ankle plantarflexion      Ankle inversion      Ankle eversion 4 4   (Blank rows = not tested)              Myotomes testing grossly WFL   LUMBAR SPECIAL TESTS:  Straight leg raise test: Negative   FUNCTIONAL TESTS:  Not assessed   GAIT: Assistive device utilized: None Level of assistance: Complete Independence Comments: mildly antalgic on right     TODAY'S TREATMENT:        [facility] Adult PT Treatment:                                                 DATE: 06/16/2022 Therapeutic Exercise: NuStep L6 x 5 min with UE/LE while taking subjective LTR x 10 focused on core control Supine piriformis pull/push stretch 2 x 30 sec each Bridge 2 x 10 SLR 2 x 10 Hooklying clamshell with blue 2 x 10 90-90 hold 10 x 10 sec Sidelying hip abduction 2 x 10 Deadlift 25# from 4" box 2 x 10 Pallof press with yellow elastispring 2 x 10 each   [facility] Adult PT Treatment:                                                DATE: 06/06/2022 Therapeutic Exercise: Supine piriformis pull/push stretch 2 x 30 sec each Bridge x 10 SLR x 10 Sidelying hip abduction x 10 Seated hamstring stretch 2 x 30 sec each   PATIENT EDUCATION:  Education details: HEP Person educated: Patient Education method: Consulting civil engineer, Demonstration, Corporate treasurer cues, Verbal cues, and Handouts Education comprehension: verbalized understanding, returned demonstration, verbal cues required, tactile cues required, and needs further education   HOME EXERCISE PROGRAM: Access Code: [email or web address]      ASSESSMENT: CLINICAL IMPRESSION: Patient tolerated therapy well with no adverse effects. Therapy focused primarily on progressing hip and core strengthening and stabilization with good tolerance. He does require cueing for lumbar control and proper core activation with exercises. Initiated lifting for hip and back strengthening with patient requiring cues for proper lifting technique. No changes to HEP this visit. Patient would benefit from continued skilled PT to progress his mobility and strength in order to reduce pain and maximize functional ability.    OBJECTIVE IMPAIRMENTS: decreased activity tolerance, decreased ROM, decreased strength, impaired flexibility, and pain.    ACTIVITY LIMITATIONS: lifting, standing, sleeping, stairs, and locomotion level   PARTICIPATION LIMITATIONS: meal prep, cleaning, laundry, community activity, and occupation   PERSONAL FACTORS: Behavior  pattern, Fitness, Past/current experiences, Social background, Time since onset of injury/illness/exacerbation, and 3+ comorbidities: see additional PMH above  are also affecting patient's functional outcome.      GOALS: Goals reviewed with patient? Yes   SHORT TERM GOALS: Target date: 07/04/2022   Patient will be I with  initial HEP in order to progress with therapy. Baseline: HEP provided at eval Goal status: INITIAL   2.  Patient will ability to walk >/= 10 minutes without having to sit due to pain in order to improve community access Baseline: 3-5 minutes Goal status: INITIAL   3.  Patient will report low back and right hip pain </= 4/10 in order to reduce functional limitations Baseline: 7/10 pain Goal status: INITIAL   LONG TERM GOALS: Target date: 08/01/2022   Patient will be I with final HEP to maintain progress from PT. Baseline: HEP provided at eval Goal status: INITIAL   2.  Patient will report modified ODI </= 50% disability in order to indiciate improve in his functional ability with tasks such as lifting, household activities, standing, and walking Baseline: 72% disability Goal status: INITIAL   3.  Patient will demonstrate right hip strength grossly >/= 4/5 MMT in order to improve walking tolerance and stair negotiation Baseline: right hip strength grossly 3+/5 MMT Goal status: INITIAL   4.  Patient will report ability to walk >/= 30 minutes with needing rest break to improve ability to go grocery shopping or walking for exercise Baseline: 3-5 min at eval Goal status: INITIAL   5.  Patient will report low back and right hip pain </= 2/10 in order to reduce functional limitations Baseline: 7/10 at eval Goal status: INITIAL     PLAN: PT FREQUENCY: 1x/week   PT DURATION: 8 weeks   PLANNED INTERVENTIONS: Therapeutic exercises, Therapeutic activity, Neuromuscular re-education, Balance training, Gait training, Patient/Family education, Self Care, Joint  mobilization, Joint manipulation, Aquatic Therapy, Dry Needling, Electrical stimulation, Spinal manipulation, Spinal mobilization, Cryotherapy, Moist heat, Ionotophoresis '4mg'$ /ml Dexamethasone, Manual therapy, and Re-evaluation.   PLAN FOR NEXT SESSION: Review HEP and progress PRN, stretching for hip and LE, progress core, hip and LE strengthening   Hilda Blades, PT, DPT, LAT, ATC 06/16/22  3:30 PM Phone: [phone] Fax: [phone]

## 2022-06-20 ENCOUNTER — Other Ambulatory Visit: Payer: Self-pay

## 2022-06-20 ENCOUNTER — Ambulatory Visit: Payer: Medicaid Other | Admitting: Physical Therapy

## 2022-06-20 ENCOUNTER — Encounter: Payer: Self-pay | Admitting: Physical Therapy

## 2022-06-20 DIAGNOSIS — M6281 Muscle weakness (generalized): Secondary | ICD-10-CM

## 2022-06-20 DIAGNOSIS — M5459 Other low back pain: Secondary | ICD-10-CM

## 2022-06-20 NOTE — Therapy (Addendum)
OUTPATIENT PHYSICAL THERAPY TREATMENT NOTE  DISCHARGE   Patient Name: [name] MRN: [id number] DOB:June 15, 1970, 52 y.o., male Today's Date: 06/20/2022  PCP: None REFERRING PROVIDER: Sheral Apley, MD    END OF SESSION:   PT End of Session - 06/20/22 1009     Visit Number 3    Number of Visits 9    Date for PT Re-Evaluation 08/01/22    Authorization Type MCD    Authorization Time Period 06/13/2022 - 2/25.2024    Authorization - Visit Number 2    Authorization - Number of Visits 3    PT Start Time 1015    PT Stop Time 1055    PT Time Calculation (min) 40 min    Activity Tolerance Patient tolerated treatment well    Behavior During Therapy [facility] for tasks assessed/performed               Past Medical History:  Diagnosis Date   Alcoholism /alcohol abuse    Anemia    Anxiety    Arthritis    knees; arms; elbows (03/26/2015)   Asthma    Bipolar disorder (HCC)    Chronic bronchitis (HCC)    Chronic lower back pain    Chronic pancreatitis (HCC)    Cocaine abuse (HCC)    Depression    Family history of adverse reaction to anesthesia    Femoral condyle fracture (HCC) 03/08/2014   left medial/notes 03/09/2014   GERD (gastroesophageal reflux disease)    H/O hiatal hernia    H/O suicide attempt 10/2012   High cholesterol    History of blood transfusion 10/2012   when I tried to commit suicide   History of stomach ulcers    Hypertension    Marijuana abuse, continuous    Migraine    a few times/year (03/26/2015)   Pneumonia 1990's X 3   PTSD (post-traumatic stress disorder)    Sickle cell trait (HCC)    WPW (Wolff-Parkinson-White syndrome)    [name] 03/06/2013   Past Surgical History:  Procedure Laterality Date   BIOPSY  11/25/2017   Procedure: BIOPSY;  Surgeon: [name] Modena, MD;  Location: [facility] ENDOSCOPY;  Service: Endoscopy;;   BIOPSY  10/14/2018   Procedure: BIOPSY;  Surgeon: [name] Modena, MD;  Location: [facility] ENDOSCOPY;  Service: Endoscopy;;    CARDIAC CATHETERIZATION     CYST ENTEROSTOMY  01/02/2020   Procedure: CYST ASPIRATION;  Surgeon: Rachael Fee, MD;  Location: [facility] ENDOSCOPY;  Service: Endoscopy;;   ESOPHAGOGASTRODUODENOSCOPY (EGD) WITH PROPOFOL N/A 11/25/2017   Procedure: ESOPHAGOGASTRODUODENOSCOPY (EGD) WITH PROPOFOL;  Surgeon: [name] Modena, MD;  Location[facility] END[facility]COPY;  Service: Endoscopy;  Laterality: N/A;   ESOPHAGOGASTRODUODENOSCOPY (EGD) WITH PROPOFOL Left 10/14/2018   Procedure: ESOPHAGOGASTRODUODENOSCOPY (EGD) WITH PROPOFOL;  Surgeon: [name] Modena, MD;  Location: [facility] ENDOSCOPY;  Service: Endoscopy;  Laterality: Left;   ESOPHAGOGASTRODUODENOSCOPY (EGD) WITH PROPOFOL N/A 11/14/2018   Procedure: ESOPHAGOGASTRODUODENOSCOPY (EGD) WITH PROPOFOL;  Surgeon: Carman Ching, MD;  Location: [facility] ENDOSCOPY;  Service: Gastroenterology;  Laterality: N/A;   ESOPHAGOGASTRODUODENOSCOPY (EGD) WITH PROPOFOL N/A 01/02/2020   Procedure: ESOPHAGOGASTRODUODENOSCOPY (EGD) WITH PROPOFOL;  Surgeon: Rachael Fee, MD;  Location: [facility] ENDOSCOPY;  Service: Endoscopy;  Laterality: N/A;   ESOPHAGOGASTRODUODENOSCOPY (EGD) WITH PROPOFOL N/A 10/25/2020   Procedure: ESOPHAGOGASTRODUODENOSCOPY (EGD) WITH PROPOFOL;  Surgeon: Meridee Score Netty Starring., MD;  Location: [facility] ENDOSCOPY;  Service: Gastroenterology;  Laterality: N/A;   EUS N/A 01/02/2020   Procedure: UPPER ENDOSCOPIC ULTRASOUND (EUS) RADIAL;  Surgeon: Rachael Fee, MD;  Location: [facility]  ENDOSCOPY;  Service: Endoscopy;  Laterality: N/A;   EYE SURGERY Left 1990's   "result of trauma"    FACIAL FRACTURE SURGERY Left 1990's   "result of trauma"    FLEXIBLE SIGMOIDOSCOPY N/A 10/25/2020   Procedure: FLEXIBLE SIGMOIDOSCOPY;  Surgeon: Lemar Lofty., MD;  Location: [facility] ENDOSCOPY;  Service: Gastroenterology;  Laterality: N/A;   FRACTURE SURGERY     HEMOSTASIS CLIP PLACEMENT  10/25/2020   Procedure: HEMOSTASIS CLIP PLACEMENT;  Surgeon: Lemar Lofty., MD;  Location: [facility] ENDOSCOPY;  Service:  Gastroenterology;;   HERNIA REPAIR     LEFT HEART CATHETERIZATION WITH CORONARY ANGIOGRAM Right 03/07/2013   Procedure: LEFT HEART CATHETERIZATION WITH CORONARY ANGIOGRAM;  Surgeon: Ricki Rodriguez, MD;  Location: [facility] CATH LAB;  Service: Cardiovascular;  Laterality: Right;   UMBILICAL HERNIA REPAIR     UPPER GASTROINTESTINAL ENDOSCOPY     Patient Active Problem List   Diagnosis Date Noted   NSVT (nonsustained ventricular tachycardia) (HCC) 01/20/2022   Normal anion gap metabolic acidosis 12/08/2021   Hypoalbuminemia 12/08/2021   Acute recurrent pancreatitis 12/07/2021   WPW (Wolff-Parkinson-White syndrome) 12/06/2021   Paroxysmal atrial fibrillation with RVR (HCC) 12/06/2021   Pulmonary nodule 12/06/2021   Leukocytosis 06/07/2021   Epigastric pain 06/07/2021   Acute alcoholic pancreatitis 06/06/2021   Urinary retention    Protein-calorie malnutrition, severe 11/17/2020   Acute pancreatitis 11/15/2020   Hypomagnesemia    Hypocalcemia 10/23/2020   AKI (acute kidney injury) (HCC) 11/13/2018   Seizure (HCC) 11/13/2018   Chest pain 01/08/2018   Acute on chronic pancreatitis (HCC) 09/28/2017   Abdominal pain 05/27/2017   Nausea & vomiting 03/18/2017   Diarrhea 03/18/2017   Gastroesophageal reflux disease    Normocytic anemia 12/05/2016   Alcohol use disorder, severe, dependence (HCC) 07/25/2016   Cocaine use disorder, severe, dependence (HCC) 07/25/2016   Major depressive disorder, 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   Chronic pancreatitis (HCC) 05/18/2015   Essential hypertension 02/06/2014   Mood disorder (HCC) 02/06/2014   Pancreatic pseudocyst/cyst 11/25/2013   Phillips Odor White pattern seen on electrocardiogram 10/03/2012   Tobacco abuse 03/23/2007    REFERRING DIAG: Lumbago, Radiculopathy    THERAPY DIAG:  Other low back pain  Muscle weakness (generalized)  Rationale for Evaluation and Treatment Rehabilitation  PERTINENT HISTORY: Alcohol  abuse, bipolar disorder, chronic pancreatitis, polysubstance abuse, hyperlipidemia, hypertension; see additional PMH above   PRECAUTIONS: None    SUBJECTIVE:         SUBJECTIVE STATEMENT:  Patient reports he is feeling pretty good. He felt loosened up and some relief after last visit. He sees the doctor again today.  PAIN:  Are you having pain? Yes:  NPRS scale: 5/10 Pain location: Low back Pain description: Achy, sharp Aggravating factors: Right side lying, walking 3-5 minutes, stairs Relieving factors: Sitting, pain cream/ scrub, hot bath, epsom salt   OBJECTIVE: (objective measures completed at initial evaluation unless otherwise dated) PATIENT SURVEYS:  Modified Oswestry 72% disability (36/50)   MUSCLE LENGTH: Patient demonstrates limitations in hamstring and piriformis flexibility   POSTURE:             Rounded shoulders, decreased lumbar lordosis   PALPATION: Tender to palpation bilateral lumbar paraspinals, right gluteal regon   LUMBAR ROM:    AROM eval  Flexion WFL  Extension WFL  Right lateral flexion WFL  Left lateral flexion WFL  Right rotation WFL  Left rotation WFL   (Blank rows = not tested) Patient reports right low back pulling  with right side bend  LOWER EXTREMITY MMT:     MMT Right eval Left eval Rt / Lt 06/20/2022  Hip flexion 4- 4-   Hip extension 3+ 4- 4- / 4-  Hip abduction 3+ 4- 4- / 4-  Hip adduction       Hip internal rotation       Hip external rotation       Knee flexion 4 4   Knee extension 4+ 5   Ankle dorsiflexion 5 5   Ankle plantarflexion       Ankle inversion       Ankle eversion 4 4    (Blank rows = not tested)              Myotomes testing grossly WFL   LUMBAR SPECIAL TESTS:  Straight leg raise test: Negative   FUNCTIONAL TESTS:  Not assessed   GAIT: Assistive device utilized: None Level of assistance: Complete Independence Comments: mildly antalgic on right     TODAY'S TREATMENT:        [facility] Adult PT  Treatment:                                                DATE: 06/20/2022 Therapeutic Exercise: NuStep L7 x 5 min with UE/LE while taking subjective Seated hamstring stretch 2 x 30 sec each LTR in figure-4 position x 5 each Supine piriformis pull/push stretch 2 x 30 sec each Bridge 2 x 10 90-90 alternating foot tap 2 x 10 Sidelying hip abduction 2 x 15 each Modified side plank on knees 5 x 10 sec each Modified front plank on knees 5 x 10 sec each Sit to stand 2 x 15 Pallof press with green tubing 2 x 10 each Deadlift 30# 3 x 10   [facility] Adult PT Treatment:                                                DATE: 06/16/2022 Therapeutic Exercise: NuStep L6 x 5 min with UE/LE while taking subjective LTR x 10 focused on core control Supine piriformis pull/push stretch 2 x 30 sec each Bridge 2 x 10 SLR 2 x 10 Hooklying clamshell with blue 2 x 10 90-90 hold 10 x 10 sec Sidelying hip abduction 2 x 10 Deadlift 25# from 4" box 2 x 10 Pallof press with yellow elastispring 2 x 10 each  [facility] Adult PT Treatment:                                                DATE: 06/06/2022 Therapeutic Exercise: Supine piriformis pull/push stretch 2 x 30 sec each Bridge x 10 SLR x 10 Sidelying hip abduction x 10 Seated hamstring stretch 2 x 30 sec each   PATIENT EDUCATION:  Education details: HEP Person educated: Patient Education method: Programmer, multimedia, Demonstration, Actor cues, Verbal cues, and Handouts Education comprehension: verbalized understanding, returned demonstration, verbal cues required, tactile cues required, and needs further education   HOME EXERCISE PROGRAM: Access Code: [email or web address]      ASSESSMENT: CLINICAL IMPRESSION: Patient tolerated therapy well with no adverse  effects. Therapy continues to focus on progressing mobility and core and hip strengthening with good tolerance. He reports improvement in symptoms following therapy and denies any increase in pain with exercises. He was able to  progress with his core stabilization exercises and lifting, but does require continued cueing for lifting technique and avoiding excessive lumbar flexion with lifting. No changes to HEP this visit. Patient would benefit from continued skilled PT to progress his mobility and strength in order to reduce pain and maximize functional ability.    OBJECTIVE IMPAIRMENTS: decreased activity tolerance, decreased ROM, decreased strength, impaired flexibility, and pain.    ACTIVITY LIMITATIONS: lifting, standing, sleeping, stairs, and locomotion level   PARTICIPATION LIMITATIONS: meal prep, cleaning, laundry, community activity, and occupation   PERSONAL FACTORS: Behavior pattern, Fitness, Past/current experiences, Social background, Time since onset of injury/illness/exacerbation, and 3+ comorbidities: see additional PMH above  are also affecting patient's functional outcome.      GOALS: Goals reviewed with patient? Yes   SHORT TERM GOALS: Target date: 07/04/2022   Patient will be I with initial HEP in order to progress with therapy. Baseline: HEP provided at eval Goal status: INITIAL   2.  Patient will ability to walk >/= 10 minutes without having to sit due to pain in order to improve community access Baseline: 3-5 minutes Goal status: INITIAL   3.  Patient will report low back and right hip pain </= 4/10 in order to reduce functional limitations Baseline: 7/10 pain Goal status: INITIAL   LONG TERM GOALS: Target date: 08/01/2022   Patient will be I with final HEP to maintain progress from PT. Baseline: HEP provided at eval Goal status: INITIAL   2.  Patient will report modified ODI </= 50% disability in order to indiciate improve in his functional ability with tasks such as lifting, household activities, standing, and walking Baseline: 72% disability Goal status: INITIAL   3.  Patient will demonstrate right hip strength grossly >/= 4/5 MMT in order to improve walking tolerance and stair  negotiation Baseline: right hip strength grossly 3+/5 MMT Goal status: INITIAL   4.  Patient will report ability to walk >/= 30 minutes with needing rest break to improve ability to go grocery shopping or walking for exercise Baseline: 3-5 min at eval Goal status: INITIAL   5.  Patient will report low back and right hip pain </= 2/10 in order to reduce functional limitations Baseline: 7/10 at eval Goal status: INITIAL     PLAN: PT FREQUENCY: 1x/week   PT DURATION: 8 weeks   PLANNED INTERVENTIONS: Therapeutic exercises, Therapeutic activity, Neuromuscular re-education, Balance training, Gait training, Patient/Family education, Self Care, Joint mobilization, Joint manipulation, Aquatic Therapy, Dry Needling, Electrical stimulation, Spinal manipulation, Spinal mobilization, Cryotherapy, Moist heat, Ionotophoresis 4mg /ml Dexamethasone, Manual therapy, and Re-evaluation.   PLAN FOR NEXT SESSION: Review HEP and progress PRN, stretching for hip and LE, progress core, hip and LE strengthening   Rosana Hoes, PT, DPT, LAT, ATC 06/20/22  11:17 AM Phone: [phone] Fax: [phone]     PHYSICAL THERAPY DISCHARGE SUMMARY  Visits from Start of Care: 3  Current functional level related to goals / functional outcomes: See above   Remaining deficits: See above   Education / Equipment: HEP   Patient agrees to discharge. Patient goals were not met. Patient is being discharged due to not returning since the last visit.  Rosana Hoes, PT, DPT, LAT, ATC 09/08/22  9:35 AM Phone: [phone] Fax: [phone]

## 2022-06-24 NOTE — Therapy (Incomplete)
OUTPATIENT PHYSICAL THERAPY TREATMENT NOTE   Patient Name: [name] MRN: [id number] DOB:05-18-1970, 52 y.o., male Today's Date: 06/24/2022  PCP: None REFERRING PROVIDER: Renette Butters, MD    END OF SESSION:       Past Medical History:  Diagnosis Date   Alcoholism /alcohol abuse    Anemia    Anxiety    Arthritis    knees; arms; elbows (03/26/2015)   Asthma    Bipolar disorder ([place])    Chronic bronchitis ([place])    Chronic lower back pain    Chronic pancreatitis ([place])    Cocaine abuse ([place])    Depression    Family history of adverse reaction to anesthesia    Femoral condyle fracture ([place]) 03/08/2014   left medial/notes 03/09/2014   GERD (gastroesophageal reflux disease)    H/O hiatal hernia    H/O suicide attempt 10/2012   High cholesterol    History of blood transfusion 10/2012   when I tried to commit suicide   History of stomach ulcers    Hypertension    Marijuana abuse, continuous    Migraine    a few times/year (03/26/2015)   Pneumonia 1990's X 3   PTSD (post-traumatic stress disorder)    Sickle cell trait ([place])    WPW (Wolff-Parkinson-White syndrome)    [name] 03/06/2013   Past Surgical History:  Procedure Laterality Date   BIOPSY  11/25/2017   Procedure: BIOPSY;  Surgeon: Arta Silence, MD;  Location: [facility];  Service: Endoscopy;;   BIOPSY  10/14/2018   Procedure: BIOPSY;  Surgeon: Arta Silence, MD;  Location: [facility];  Service: Endoscopy;;   CARDIAC CATHETERIZATION     CYST ENTEROSTOMY  01/02/2020   Procedure: CYST ASPIRATION;  Surgeon: Milus Banister, MD;  Location: [facility];  Service: Endoscopy;;   ESOPHAGOGASTRODUODENOSCOPY (EGD) WITH PROPOFOL N/A 11/25/2017   Procedure: ESOPHAGOGASTRODUODENOSCOPY (EGD) WITH PROPOFOL;  Surgeon: Arta Silence, MD;  Location: [facility];  Service: Endoscopy;  Laterality: N/A;   ESOPHAGOGASTRODUODENOSCOPY (EGD) WITH PROPOFOL Left 10/14/2018   Procedure: ESOPHAGOGASTRODUODENOSCOPY  (EGD) WITH PROPOFOL;  Surgeon: Arta Silence, MD;  Location: [facility] ENDOSCOPY;  Service: Endoscopy;  Laterality: Left;   ESOPHAGOGASTRODUODENOSCOPY (EGD) WITH PROPOFOL N/A 11/14/2018   Procedure: ESOPHAGOGASTRODUODENOSCOPY (EGD) WITH PROPOFOL;  Surgeon: Laurence Spates, MD;  [facility]: [facility];  Service: Gastroenterology;  Laterality: N/A;   ESOPHAGOGASTRODUODENOSCOPY (EGD) WITH PROPOFOL N/A 01/02/2020   Procedure: ESOPHAGOGASTRODUODENOSCOPY (EGD) WITH PROPOFOL;  Surgeon: Milus Banister, MD;  [facility];  Service: Endoscopy;  Laterality: N/A;   ESOPHAGOGASTRODUODENOSCOPY (EGD) WITH PROPOFOL N/A 10/25/2020   Procedure: ESOPHAGOGASTRODUODENOSCOPY (EGD) WITH PROPOFOL;  Surgeon: Rush Landmark Telford Nab., MD;  Location: [facility];  Service: Gastroenterology;  Laterality: N/A;   EUS N/A 01/02/2020   Procedure: UPPER ENDOSCOPIC ULTRASOUND (EUS) RADIAL;  Surgeon: Milus Banister, MD;  Location: [facility];  Service: Endoscopy;  Laterality: N/A;   EYE SURGERY Left 1990's   "result of trauma"    FACIAL FRACTURE SURGERY Left 1990's   "result of trauma"    FLEXIBLE SIGMOIDOSCOPY N/A 10/25/2020   Procedure: FLEXIBLE SIGMOIDOSCOPY;  Surgeon: Irving Copas., MD;  Location: [facility];  Service: Gastroenterology;  Laterality: N/A;   FRACTURE SURGERY     HEMOSTASIS CLIP PLACEMENT  10/25/2020   Procedure: HEMOSTASIS CLIP PLACEMENT;  Surgeon: Irving Copas., MD;  Location: [facility];  Service: Gastroenterology;;   HERNIA REPAIR     LEFT HEART CATHETERIZATION WITH CORONARY ANGIOGRAM Right 03/07/2013   Procedure: LEFT HEART CATHETERIZATION WITH CORONARY  ANGIOGRAM;  Surgeon: [name], MD;  Location: [facility] C[facility];  Service: Cardiovascular;  Laterality: Right;   UMBILICAL HERNIA REPAIR     UPPER GASTROINTESTINAL ENDOSCOPY     Patient Active Problem List   Diagnosis Date Noted   NSVT (nonsustained ventricular tachycardia) ([place]) 01/20/2022   Normal anion gap metabolic  acidosis [id number]   Hypoalbuminemia 12/08/2021   Acute recurrent pancreatitis 12/07/2021   WPW (Wolff-Parkinson-White syndrome) 12/06/2021   Paroxysmal atrial fibrillation with RVR ([place]) 12/06/2021   Pulmonary nodule 12/06/2021   Leukocytosis 06/07/2021   Epigastric pain 0000000   Acute alcoholic pancreatitis 0000000   Urinary retention    Protein-calorie malnutrition, severe 11/17/2020   Acute pancreatitis 11/15/2020   Hypomagnesemia    Hypocalcemia 10/23/2020   AKI (acute kidney injury) ([place]) 11/13/2018   Seizure ([place]) 11/13/2018   Chest pain 01/08/2018   Acute on chronic pancreatitis ([place]) 09/28/2017   Abdominal pain 05/27/2017   Nausea & vomiting 03/18/2017   Diarrhea 03/18/2017   Gastroesophageal reflux disease    Normocytic anemia 12/05/2016   Alcohol use disorder, severe, dependence ([place]) 07/25/2016   Cocaine use disorder, severe, dependence ([place]) 07/25/2016   Major depressive disorder, recurrent severe without psychotic features ([place]) 07/20/2016   Chronic pancreatitis ([place]) 05/18/2015   Essential hypertension 02/06/2014   Mood disorder ([place]) 02/06/2014   Pancreatic pseudocyst/cyst 11/25/2013   [name] White pattern seen on electrocardiogram 10/03/2012   Tobacco abuse 03/23/2007    REFERRING DIAG: Lumbago, Radiculopathy    THERAPY DIAG:  No diagnosis found.  Rationale for Evaluation and Treatment Rehabilitation  PERTINENT HISTORY: Alcohol abuse, bipolar disorder, chronic pancreatitis, polysubstance abuse, hyperlipidemia, hypertension; see additional PMH above   PRECAUTIONS: None    SUBJECTIVE:         SUBJECTIVE STATEMENT:  Patient reports he is feeling pretty good. He felt loosened up and some relief after last visit. He sees the doctor again today.  PAIN:  Are you having pain? Yes:  NPRS scale: 5/10 Pain location: Low back Pain description: Achy, sharp Aggravating factors: Right side lying, walking 3-5 minutes, stairs Relieving factors:  Sitting, pain cream/ scrub, hot bath, epsom salt   OBJECTIVE: (objective measures completed at initial evaluation unless otherwise dated) PATIENT SURVEYS:  Modified Oswestry 72% disability (36/50)   MUSCLE LENGTH: Patient demonstrates limitations in hamstring and piriformis flexibility   POSTURE:             Rounded shoulders, decreased lumbar lordosis   PALPATION: Tender to palpation bilateral lumbar paraspinals, right gluteal regon   LUMBAR ROM:    AROM eval  Flexion WFL  Extension WFL  Right lateral flexion WFL  Left lateral flexion WFL  Right rotation WFL  Left rotation WFL   (Blank rows = not tested) Patient reports right low back pulling with right side bend  LOWER EXTREMITY MMT:     MMT Right eval Left eval Rt / Lt 06/20/2022  Hip flexion 4- 4-   Hip extension 3+ 4- 4- / 4-  Hip abduction 3+ 4- 4- / 4-  Hip adduction       Hip internal rotation       Hip external rotation       Knee flexion 4 4   Knee extension 4+ 5   Ankle dorsiflexion 5 5   Ankle plantarflexion       Ankle inversion       Ankle eversion 4 4    (Blank rows = not tested)  Myotomes testing grossly WFL   LUMBAR SPECIAL TESTS:  Straight leg raise test: Negative   FUNCTIONAL TESTS:  Not assessed   GAIT: Assistive device utilized: None Level of assistance: Complete Independence Comments: mildly antalgic on right     TODAY'S TREATMENT:        [facility] Adult PT Treatment:                                                DATE: 06/27/2022 Therapeutic Exercise: NuStep L7 x 5 min with UE/LE while taking subjective Seated hamstring stretch 2 x 30 sec each LTR in figure-4 position x 5 each Supine piriformis pull/push stretch 2 x 30 sec each Bridge 2 x 10 90-90 alternating foot tap 2 x 10 Sidelying hip abduction 2 x 15 each Modified side plank on knees 5 x 10 sec each Modified front plank on knees 5 x 10 sec each Sit to stand 2 x 15 Pallof press with green tubing 2 x 10  each Deadlift 30# 3 x 10   [facility] Adult PT Treatment:                                                DATE: 06/20/2022 Therapeutic Exercise: NuStep L7 x 5 min with UE/LE while taking subjective Seated hamstring stretch 2 x 30 sec each LTR in figure-4 position x 5 each Supine piriformis pull/push stretch 2 x 30 sec each Bridge 2 x 10 90-90 alternating foot tap 2 x 10 Sidelying hip abduction 2 x 15 each Modified side plank on knees 5 x 10 sec each Modified front plank on knees 5 x 10 sec each Sit to stand 2 x 15 Pallof press with green tubing 2 x 10 each Deadlift 30# 3 x 10  [facility] Adult PT Treatment:                                                DATE: 06/16/2022 Therapeutic Exercise: NuStep L6 x 5 min with UE/LE while taking subjective LTR x 10 focused on core control Supine piriformis pull/push stretch 2 x 30 sec each Bridge 2 x 10 SLR 2 x 10 Hooklying clamshell with blue 2 x 10 90-90 hold 10 x 10 sec Sidelying hip abduction 2 x 10 Deadlift 25# from 4" box 2 x 10 Pallof press with yellow elastispring 2 x 10 each   PATIENT EDUCATION:  Education details: HEP Person educated: Patient Education method: Consulting civil engineer, Demonstration, Tactile cues, Verbal cues, and Handouts Education comprehension: verbalized understanding, returned demonstration, verbal cues required, tactile cues required, and needs further education   HOME EXERCISE PROGRAM: Access Code: [id number]      ASSESSMENT: CLINICAL IMPRESSION: Patient tolerated therapy well with no adverse effects. *** Patient would benefit from continued skilled PT to progress his mobility and strength in order to reduce pain and maximize functional ability.  Therapy continues to focus on progressing mobility and core and hip strengthening with good tolerance. He reports improvement in symptoms following therapy and denies any increase in pain with exercises. He was able to progress with his core stabilization exercises  and lifting, but does  require continued cueing for lifting technique and avoiding excessive lumbar flexion with lifting. No changes to HEP this visit.     OBJECTIVE IMPAIRMENTS: decreased activity tolerance, decreased ROM, decreased strength, impaired flexibility, and pain.    ACTIVITY LIMITATIONS: lifting, standing, sleeping, stairs, and locomotion level   PARTICIPATION LIMITATIONS: meal prep, cleaning, laundry, community activity, and occupation   PERSONAL FACTORS: Behavior pattern, Fitness, Past/current experiences, Social background, Time since onset of injury/illness/exacerbation, and 3+ comorbidities: see additional PMH above  are also affecting patient's functional outcome.      GOALS: Goals reviewed with patient? Yes   SHORT TERM GOALS: Target date: 07/04/2022   Patient will be I with initial HEP in order to progress with therapy. Baseline: HEP provided at eval Goal status: INITIAL   2.  Patient will ability to walk >/= 10 minutes without having to sit due to pain in order to improve community access Baseline: 3-5 minutes Goal status: INITIAL   3.  Patient will report low back and right hip pain </= 4/10 in order to reduce functional limitations Baseline: 7/10 pain Goal status: INITIAL   LONG TERM GOALS: Target date: 08/01/2022   Patient will be I with final HEP to maintain progress from PT. Baseline: HEP provided at eval Goal status: INITIAL   2.  Patient will report modified ODI </= 50% disability in order to indiciate improve in his functional ability with tasks such as lifting, household activities, standing, and walking Baseline: 72% disability Goal status: INITIAL   3.  Patient will demonstrate right hip strength grossly >/= 4/5 MMT in order to improve walking tolerance and stair negotiation Baseline: right hip strength grossly 3+/5 MMT Goal status: INITIAL   4.  Patient will report ability to walk >/= 30 minutes with needing rest break to improve ability to go grocery shopping or  walking for exercise Baseline: 3-5 min at eval Goal status: INITIAL   5.  Patient will report low back and right hip pain </= 2/10 in order to reduce functional limitations Baseline: 7/10 at eval Goal status: INITIAL     PLAN: PT FREQUENCY: 1x/week   PT DURATION: 8 weeks   PLANNED INTERVENTIONS: Therapeutic exercises, Therapeutic activity, Neuromuscular re-education, Balance training, Gait training, Patient/Family education, Self Care, Joint mobilization, Joint manipulation, Aquatic Therapy, Dry Needling, Electrical stimulation, Spinal manipulation, Spinal mobilization, Cryotherapy, Moist heat, Ionotophoresis 48m/ml Dexamethasone, Manual therapy, and Re-evaluation.   PLAN FOR NEXT SESSION: Review HEP and progress PRN, stretching for hip and LE, progress core, hip and LE strengthening   CHilda Blades PT, DPT, LAT, ATC 06/24/22  9:37 AM Phone: [phone]: [phone]

## 2022-06-27 ENCOUNTER — Emergency Department (HOSPITAL_COMMUNITY): Payer: Medicaid Other

## 2022-06-27 ENCOUNTER — Emergency Department (HOSPITAL_COMMUNITY)
Admission: EM | Admit: 2022-06-27 | Discharge: 2022-06-27 | Disposition: A | Discharge status: Home / Self Care | Payer: Medicaid Other | Attending: Emergency Medicine | Admitting: Emergency Medicine

## 2022-06-27 ENCOUNTER — Encounter (HOSPITAL_COMMUNITY): Payer: Self-pay

## 2022-06-27 ENCOUNTER — Ambulatory Visit: Payer: Medicaid Other | Admitting: Physical Therapy

## 2022-06-27 ENCOUNTER — Other Ambulatory Visit: Payer: Self-pay

## 2022-06-27 DIAGNOSIS — K860 Alcohol-induced chronic pancreatitis: Secondary | ICD-10-CM | POA: Diagnosis not present

## 2022-06-27 DIAGNOSIS — I4891 Unspecified atrial fibrillation: Secondary | ICD-10-CM | POA: Diagnosis not present

## 2022-06-27 DIAGNOSIS — R109 Unspecified abdominal pain: Secondary | ICD-10-CM | POA: Diagnosis present

## 2022-06-27 DIAGNOSIS — Z79899 Other long term (current) drug therapy: Secondary | ICD-10-CM | POA: Insufficient documentation

## 2022-06-27 DIAGNOSIS — I10 Essential (primary) hypertension: Secondary | ICD-10-CM | POA: Insufficient documentation

## 2022-06-27 DIAGNOSIS — Z91040 Latex allergy status: Secondary | ICD-10-CM | POA: Insufficient documentation

## 2022-06-27 LAB — BASIC METABOLIC PANEL
Anion gap: 16 — ABNORMAL HIGH (ref 5–15)
BUN: 11 mg/dL (ref 6–20)
CO2: 10 mmol/L — ABNORMAL LOW (ref 22–32)
Calcium: 7.3 mg/dL — ABNORMAL LOW (ref 8.9–10.3)
Chloride: 112 mmol/L — ABNORMAL HIGH (ref 98–111)
Creatinine, Ser: 1.18 mg/dL (ref 0.61–1.24)
GFR, Estimated: >60 mL/min (ref >60)
Glucose, Bld: 78 mg/dL (ref 70–99)
Potassium: 5 mmol/L (ref 3.5–5.1)
Sodium: 138 mmol/L (ref 135–145)

## 2022-06-27 LAB — HEPATIC FUNCTION PANEL
ALT: 79 U/L — ABNORMAL HIGH (ref 0–44)
AST: 133 U/L — ABNORMAL HIGH (ref 15–41)
Albumin: 4.3 g/dL (ref 3.5–5.0)
Alkaline Phosphatase: 91 U/L (ref 38–126)
Bilirubin, Direct: 0.3 mg/dL — ABNORMAL HIGH (ref 0.0–0.2)
Indirect Bilirubin: 0.5 mg/dL (ref 0.3–0.9)
Total Bilirubin: 0.8 mg/dL (ref 0.3–1.2)
Total Protein: 8 g/dL (ref 6.5–8.1)

## 2022-06-27 LAB — CBC
HCT: 38.4 % — ABNORMAL LOW (ref 39.0–52.0)
Hemoglobin: 12 g/dL — ABNORMAL LOW (ref 13.0–17.0)
MCH: 31.2 pg (ref 26.0–34.0)
MCHC: 31.3 g/dL (ref 30.0–36.0)
MCV: 99.7 fL (ref 80.0–100.0)
Platelets: 141 10*3/uL — ABNORMAL LOW (ref 150–400)
RBC: 3.85 MIL/uL — ABNORMAL LOW (ref 4.22–5.81)
RDW: 18 % — ABNORMAL HIGH (ref 11.5–15.5)
WBC: 10.9 10*3/uL — ABNORMAL HIGH (ref 4.0–10.5)
nRBC: 0 % (ref 0.0–0.2)

## 2022-06-27 LAB — LIPASE, BLOOD: Lipase: 27 U/L (ref 11–51)

## 2022-06-27 LAB — TROPONIN I (HIGH SENSITIVITY)
Troponin I (High Sensitivity): 13 ng/L (ref <18)
Troponin I (High Sensitivity): 15 ng/L (ref <18)

## 2022-06-27 MED ORDER — PANTOPRAZOLE SODIUM 40 MG PO TBEC: 40.0000 mg | DELAYED_RELEASE_TABLET | Freq: Two times a day (BID) | ORAL | 3 refills | Status: DC

## 2022-06-27 MED ORDER — FAMOTIDINE IN NACL 20-0.9 MG/50ML-% IV SOLN
20.0000 mg | Freq: Once | INTRAVENOUS | Status: AC
  Administered 2022-06-27: 20 mg via INTRAVENOUS
  Filled 2022-06-27: qty 50

## 2022-06-27 MED ORDER — LIDOCAINE VISCOUS HCL 2 % MT SOLN: 15.0000 mL | OROMUCOSAL | 0 refills | Status: DC | PRN

## 2022-06-27 MED ORDER — MORPHINE SULFATE (PF) 4 MG/ML IV SOLN
6.0000 mg | Freq: Once | INTRAVENOUS | Status: AC
  Administered 2022-06-27: 6 mg via INTRAVENOUS
  Filled 2022-06-27: qty 2

## 2022-06-27 MED ORDER — OXYCODONE-ACETAMINOPHEN 5-325 MG PO TABS
1.0000 | ORAL_TABLET | Freq: Once | ORAL | Status: AC
  Administered 2022-06-27: 1 via ORAL
  Filled 2022-06-27: qty 1

## 2022-06-27 MED ORDER — ONDANSETRON HCL 4 MG/2ML IJ SOLN
4.0000 mg | Freq: Once | INTRAMUSCULAR | Status: AC
  Administered 2022-06-27: 4 mg via INTRAVENOUS
  Filled 2022-06-27: qty 2

## 2022-06-27 MED ORDER — LACTATED RINGERS IV BOLUS
1500.0000 mL | Freq: Once | INTRAVENOUS | Status: AC
  Administered 2022-06-27: 1500 mL via INTRAVENOUS

## 2022-06-27 NOTE — ED Provider Notes (Signed)
[facility] Provider Note   CSN: [id number] Arrival date & time: 06/27/22  1418     History {Add pertinent medical, surgical, social history, OB history to HPI:1} No chief complaint on file.   [name] is a 52 y.o. male.  HPI         Home Medications Prior to Admission medications   Medication Sig Start Date End Date Taking? Authorizing Provider  lidocaine (XYLOCAINE) 2 % solution Use as directed 15 mLs in the mouth or throat as needed for mouth pain. 06/27/22  Yes Silas Muff, Ova Freshwater S, PA  acetaminophen (TYLENOL) 500 MG tablet Take 1,000 mg by mouth every 6 (six) hours as needed for moderate pain or headache.    [provider]  acetaminophen (TYLENOL) 650 MG CR tablet Take 1 tablet (650 mg total) by mouth every 8 (eight) hours as needed for pain. 03/05/22   Nyoka Lint, PA-C  albuterol (VENTOLIN HFA) 108 (90 Base) MCG/ACT inhaler Inhale 2 puffs into the lungs every 4 (four) hours as needed for wheezing or shortness of breath. 08/17/21   [name], MD  amLODipine (NORVASC) 10 MG tablet Take 1 tablet (10 mg total) by mouth daily. 12/25/21   [name], MD  benzonatate (TESSALON) 100 MG capsule Take 1 capsule (100 mg total) by mouth 3 (three) times daily as needed for cough. 03/14/22   Long, Wonda Olds, MD  dextromethorphan-guaiFENesin [facility] DM) 30-600 MG 12hr tablet Take 1 tablet by mouth 2 (two) times daily. 03/05/22   Nyoka Lint, PA-C  dicyclomine (BENTYL) 10 MG capsule Take 1 capsule (10 mg total) by mouth 2 (two) times daily. 03/15/22   [name], [name], MD  DULoxetine (CYMBALTA) 30 MG capsule Take 2 capsules (60 mg total) by mouth at bedtime. Take 1 capsule daily at bedtime for 2 weeks and then increase to 2 capsules at bedtime 04/06/22   [name], [name], MD  folic acid (FOLVITE) 1 MG tablet Take 1 tablet (1 mg total) by mouth daily. 11/26/17   [name], MD   HYDROcodone-acetaminophen (NORCO/VICODIN) 5-325 MG tablet Take 1 tablet by mouth every 12 (twelve) hours as needed for moderate pain. 12/24/21   [name], MD  lidocaine (LMX) 4 % cream Apply topically 4 (four) times daily as needed (NGT pain). 12/24/21   [name], MD  lidocaine (XYLOCAINE) 2 % solution Use as directed 15 mLs in the mouth or throat 2 (two) times daily as needed (chronic abdominal pain). 06/08/22   [name], [name], MD  lipase/protease/amylase (CREON) 36000 UNITS CPEP capsule Take 2-3 capsules (72,000-108,000 Units total) by mouth 3 (three) times daily with meals. 03/15/22   [name], [name], MD  loperamide (IMODIUM) 2 MG capsule Take 1 capsule (2 mg total) by mouth 4 (four) times daily as needed for diarrhea or loose stools. 02/17/22   Tegeler, Gwenyth Allegra, MD  magnesium oxide (MAG-OX) 400 (240 Mg) MG tablet Take 1 tablet (400 mg total) by mouth daily. 11/30/21   Davonna Belling, MD  metoprolol succinate (TOPROL XL) 25 MG 24 hr tablet Take 1 tablet (25 mg total) by mouth as needed (for palpitations). To not exceed 1 dose per day. Patient taking differently: Take 50 mg by mouth daily. 01/06/22   Duke, Tami Lin, PA  metoprolol succinate (TOPROL-XL) 200 MG 24 hr tablet Take 1 tablet (200 mg total) by mouth daily. Take with or immediately following a meal. 02/14/22   Hochrein,  Jeneen Rinks, MD  Multiple Vitamin (MULTIVITAMIN WITH MINERALS) TABS tablet Take 1 tablet by mouth daily. 09/06/17   Bonnell Public, MD  nicotine (NICODERM CQ - DOSED IN MG/24 HOURS) 21 mg/24hr patch Place 1 patch (21 mg total) onto the skin at bedtime. 12/24/21   [name], MD  nicotine polacrilex (NICORETTE STARTER KIT) 4 MG gum Take 1 each (4 mg total) by mouth as needed for smoking cessation. 03/15/22   [name], [name], MD  nitroGLYCERIN (NITROSTAT) 0.4 MG SL tablet Place 1 tablet (0.4 mg total) under the tongue every 5 (five) minutes as needed for chest pain. Do not exceed 3  doses in a row. 01/06/22   Duke, Tami Lin, PA  ondansetron (ZOFRAN) 4 MG tablet Take 1 tablet (4 mg total) by mouth every 8 (eight) hours as needed for nausea or vomiting. 05/18/22   [name], [name], MD  oxyCODONE (ROXICODONE) 5 MG immediate release tablet Take 1 tablet (5 mg total) by mouth every 4 (four) hours as needed for severe pain. 02/17/22   Tegeler, Gwenyth Allegra, MD  oxyCODONE-acetaminophen (PERCOCET/ROXICET) 5-325 MG tablet Take 1 tablet by mouth every 6 (six) hours as needed for severe pain. 03/14/22   Long, Wonda Olds, MD  pantoprazole (PROTONIX) 40 MG tablet Take 1 tablet (40 mg total) by mouth 2 (two) times daily. 06/27/22   Tedd Sias, PA  Polyethyl Glycol-Propyl Glycol (SYSTANE OP) Place 1 drop into both eyes 4 (four) times daily as needed (for dryness).    [provider]  sucralfate (CARAFATE) 1 GM/10ML suspension Take 10 mLs (1 g total) by mouth 4 (four) times daily. 05/18/22   [name], [name], MD  thiamine 100 MG tablet Take 1 tablet (100 mg total) by mouth daily. 10/16/18   [name], MD  Vitamin D, Ergocalciferol, (DRISDOL) 1.25 MG (50000 UNIT) CAPS capsule Take 50,000 Units by mouth every Wednesday.    [provider]  amitriptyline (ELAVIL) 25 MG tablet Take 1 tablet (25 mg total) by mouth at bedtime. Patient not taking: Reported on 08/08/2019 10/15/18 08/08/19  [name], MD      Allergies    Robaxin [methocarbamol], Aspirin, Shellfish-derived products, Trazodone and nefazodone, Adhesive [tape], Latex, Toradol [ketorolac tromethamine], Contrast media [iodinated contrast media], Reglan [metoclopramide], and Salmon [fish oil]    Review of Systems   Review of Systems  Physical Exam Updated Vital Signs BP (!) 151/98   Pulse 85   Temp 98.5 F (36.9 C) (Oral)   Resp 16   Ht 5' 9"$  (1.753 m)   Wt 61.2 kg   SpO2 100%   BMI 19.94 kg/m  Physical Exam Vitals and nursing note reviewed.  Constitutional:      General: He is not in acute  distress. HENT:     Head: Normocephalic and atraumatic.     Nose: Nose normal.     Mouth/Throat:     Mouth: Mucous membranes are dry.  Eyes:     General: No scleral icterus. Cardiovascular:     Rate and Rhythm: Normal rate and regular rhythm.     Pulses: Normal pulses.     Heart sounds: Normal heart sounds.  Pulmonary:     Effort: Pulmonary effort is normal. No respiratory distress.     Breath sounds: No wheezing.  Abdominal:     Palpations: Abdomen is soft.     Tenderness: There is no abdominal tenderness. There is no guarding.  Musculoskeletal:     Cervical back:  Normal range of motion.     Right lower leg: No edema.     Left lower leg: No edema.  Skin:    General: Skin is warm and dry.     Capillary Refill: Capillary refill takes less than 2 seconds.  Neurological:     Mental Status: He is alert. Mental status is at baseline.  Psychiatric:        Mood and Affect: Mood normal.        Behavior: Behavior normal.     ED Results / Procedures / Treatments   Labs (all labs ordered are listed, but only abnormal results are displayed) Labs Reviewed  CBC - Abnormal; Notable for the following components:      Result Value   WBC 10.9 (*)    RBC 3.85 (*)    Hemoglobin 12.0 (*)    HCT 38.4 (*)    RDW 18.0 (*)    Platelets 141 (*)    All other components within normal limits  HEPATIC FUNCTION PANEL - Abnormal; Notable for the following components:   AST 133 (*)    ALT 79 (*)    Bilirubin, Direct 0.3 (*)    All other components within normal limits  BASIC METABOLIC PANEL - Abnormal; Notable for the following components:   Chloride 112 (*)    CO2 10 (*)    Calcium 7.3 (*)    Anion gap 16 (*)    All other components within normal limits  LIPASE, BLOOD  TROPONIN I (HIGH SENSITIVITY)  TROPONIN I (HIGH SENSITIVITY)    EKG None  Radiology DG Chest 2 View  Result Date: 06/27/2022 CLINICAL DATA:  Sharp stabbing left-sided chest pain EXAM: CHEST - 2 VIEW COMPARISON:   Chest radiograph dated 05/28/2022 FINDINGS: Normal lung volumes. No focal consolidations. No pleural effusion or pneumothorax. The heart size and mediastinal contours are within normal limits. The visualized skeletal structures are unremarkable. IMPRESSION: No active cardiopulmonary disease. Electronically Signed   By: Darrin Nipper M.D.   On: 06/27/2022 15:17    Procedures Procedures  {Document cardiac monitor, telemetry assessment procedure when appropriate:1}  Medications Ordered in ED Medications  oxyCODONE-acetaminophen (PERCOCET/ROXICET) 5-325 MG per tablet 1 tablet (has no administration in time range)  lactated ringers bolus 1,500 mL (1,500 mLs Intravenous New Bag/Given 06/27/22 1656)  morphine (PF) 4 MG/ML injection 6 mg (6 mg Intravenous Given 06/27/22 1658)  ondansetron (ZOFRAN) injection 4 mg (4 mg Intravenous Given 06/27/22 1657)  famotidine (PEPCID) IVPB 20 mg premix (0 mg Intravenous Stopped 06/27/22 1747)    ED Course/ Medical Decision Making/ A&P   {   Click here for ABCD2, HEART and other calculatorsREFRESH Note before signing :1}                          Medical Decision Making Amount and/or Complexity of Data Reviewed Labs: ordered. Radiology: ordered.  Risk Prescription drug management.   ***  {Document critical care time when appropriate:1} {Document review of labs and clinical decision tools ie heart score, Chads2Vasc2 etc:1}  {Document your independent review of radiology images, and any outside records:1} {Document your discussion with family members, caretakers, and with consultants:1} {Document social determinants of health affecting pt's care:1} {Document your decision making why or why not admission, treatments were needed:1} Final Clinical Impression(s) / ED Diagnoses Final diagnoses:  Alcohol-induced chronic pancreatitis ([place])    Rx / DC Orders ED Discharge Orders  Ordered    pantoprazole (PROTONIX) 40 MG tablet  2 times daily         06/27/22 1917    lidocaine (XYLOCAINE) 2 % solution  As needed        06/27/22 1917

## 2022-06-27 NOTE — Discharge Instructions (Signed)
Please stop drinking alcohol, I have written a prescription for your home medications that you are taking.

## 2022-06-27 NOTE — ED Triage Notes (Signed)
Pt BIB [facility] from Home d/t sharp/stabbing Lt sided CP for the past 50 minutes. Did not take any Nitro, 12 L unremarkable, 118/72. 86 bpm, 99% RA, n/v/d x3 since this morning. Hx of pancreatitis, A/Ox4, rates pain 7/10.

## 2022-06-27 NOTE — ED Notes (Signed)
Received shift report, assumed care of patient at this time

## 2022-07-01 NOTE — Therapy (Incomplete)
OUTPATIENT PHYSICAL THERAPY TREATMENT NOTE   Patient Name: [name] MRN: [id number] DOB:December 05, 1970, 52 y.o., male Today's Date: 07/01/2022  PCP: None REFERRING PROVIDER: Renette Butters, MD    END OF SESSION:       Past Medical History:  Diagnosis Date   Alcoholism /alcohol abuse    Anemia    Anxiety    Arthritis    knees; arms; elbows (03/26/2015)   Asthma    Bipolar disorder ([place])    Chronic bronchitis ([place])    Chronic lower back pain    Chronic pancreatitis ([place])    Cocaine abuse ([place])    Depression    Family history of adverse reaction to anesthesia    Femoral condyle fracture ([place]) 03/08/2014   left medial/notes 03/09/2014   GERD (gastroesophageal reflux disease)    H/O hiatal hernia    H/O suicide attempt 10/2012   High cholesterol    History of blood transfusion 10/2012   when I tried to commit suicide   History of stomach ulcers    Hypertension    Marijuana abuse, continuous    Migraine    a few times/year (03/26/2015)   Pneumonia 1990's X 3   PTSD (post-traumatic stress disorder)    Sickle cell trait ([place])    WPW (Wolff-Parkinson-White syndrome)    [name] 03/06/2013   Past Surgical History:  Procedure Laterality Date   BIOPSY  11/25/2017   Procedure: BIOPSY;  Surgeon: Arta Silence, MD;  Location: [facility];  Service: Endoscopy;;   BIOPSY  10/14/2018   Procedure: BIOPSY;  Surgeon: Arta Silence, MD;  Location: [facility];  Service: Endoscopy;;   CARDIAC CATHETERIZATION     CYST ENTEROSTOMY  01/02/2020   Procedure: CYST ASPIRATION;  Surgeon: Milus Banister, MD;  Location: [facility];  Service: Endoscopy;;   ESOPHAGOGASTRODUODENOSCOPY (EGD) WITH PROPOFOL N/A 11/25/2017   Procedure: ESOPHAGOGASTRODUODENOSCOPY (EGD) WITH PROPOFOL;  Surgeon: Arta Silence, MD;  Location: [facility];  Service: Endoscopy;  Laterality: N/A;   ESOPHAGOGASTRODUODENOSCOPY (EGD) WITH PROPOFOL Left 10/14/2018   Procedure: ESOPHAGOGASTRODUODENOSCOPY  (EGD) WITH PROPOFOL;  Surgeon: Arta Silence, MD;  Location[facility]COPY;  Service: Endoscopy;  Laterality: Left;   ESOPHAGOGASTRODUODENOSCOPY (EGD) WITH PROPOFOL N/A 11/14/2018   Procedure: ESOPHAGOGASTRODUODENOSCOPY (EGD) WITH PROPOFOL;  Surgeon: Laurence Spates, MD;  Location: [facility];  Service: Gastroenterology;  Laterality: N/A;   ESOPHAGOGASTRODUODENOSCOPY (EGD) WITH PROPOFOL N/A 01/02/2020   Procedure: ESOPHAGOGASTRODUODENOSCOPY (EGD) WITH PROPOFOL;  Surgeon: Milus Banister, MD;  Location: [facility];  Service: Endoscopy;  Laterality: N/A;   ESOPHAGOGASTRODUODENOSCOPY (EGD) WITH PROPOFOL N/A 10/25/2020   Procedure: ESOPHAGOGASTRODUODENOSCOPY (EGD) WITH PROPOFOL;  Surgeon: Rush Landmark Telford Nab., MD;  Location: [facility];  Service: Gastroenterology;  Laterality: N/A;   EUS N/A 01/02/2020   Procedure: UPPER ENDOSCOPIC ULTRASOUND (EUS) RADIAL;  Surgeon: Milus Banister, MD;  Location: [facility];  Service: Endoscopy;  Laterality: N/A;   EYE SURGERY Left 1990's   "result of trauma"    FACIAL FRACTURE SURGERY Left 1990's   "result of trauma"    FLEXIBLE SIGMOIDOSCOPY N/A 10/25/2020   Procedure: FLEXIBLE SIGMOIDOSCOPY;  Surgeon: Irving Copas., MD;  Location: [facility];  Service: Gastroenterology;  Laterality: N/A;   FRACTURE SURGERY     HEMOSTASIS CLIP PLACEMENT  10/25/2020   Procedure: HEMOSTASIS CLIP PLACEMENT;  Surgeon: Irving Copas., MD;  Location: [facility];  Service: Gastroenterology;;   HERNIA REPAIR     LEFT HEART CATHETERIZATION WITH CORONARY ANGIOGRAM Right 03/07/2013   Procedure: LEFT HEART CATHETERIZATION WITH CORONARY  ANGIOGRAM;  Surgeon: [name], MD;  Location: [facility] CATH LAB;  Service: Cardiovascular;  Laterality: Right;   UMBILICAL HERNIA REPAIR     UPPER GASTROINTESTINAL ENDOSCOPY     Patient Active Problem List   Diagnosis Date Noted   NSVT (nonsustained ventricular tachycardia) ([place]) 01/20/2022   Normal anion gap metabolic  acidosis [id number]   Hypoalbuminemia 12/08/2021   Acute recurrent pancreatitis 12/07/2021   WPW (Wolff-Parkinson-White syndrome) 12/06/2021   Paroxysmal atrial fibrillation with RVR ([place]) 12/06/2021   Pulmonary nodule 12/06/2021   Leukocytosis 06/07/2021   Epigastric pain 0000000   Acute alcoholic pancreatitis 0000000   Urinary retention    Protein-calorie malnutrition, severe 11/17/2020   Acute pancreatitis 11/15/2020   Hypomagnesemia    Hypocalcemia 10/23/2020   AKI (acute kidney injury) ([place]) 11/13/2018   Seizure ([place]) 11/13/2018   Chest pain 01/08/2018   Acute on chronic pancreatitis ([place]) 09/28/2017   Abdominal pain 05/27/2017   Nausea & vomiting 03/18/2017   Diarrhea 03/18/2017   Gastroesophageal reflux disease    Normocytic anemia 12/05/2016   Alcohol use disorder, severe, dependence ([place]) 07/25/2016   Cocaine use disorder, severe, dependence ([place]) 07/25/2016   Major depressive disorder, recurrent severe without psychotic features ([place]) 07/20/2016   Chronic pancreatitis ([place]) 05/18/2015   Essential hypertension 02/06/2014   Mood disorder ([place]) 02/06/2014   Pancreatic pseudocyst/cyst 11/25/2013   [name] White pattern seen on electrocardiogram 10/03/2012   Tobacco abuse 03/23/2007    REFERRING DIAG: Lumbago, Radiculopathy    THERAPY DIAG:  No diagnosis found.  Rationale for Evaluation and Treatment Rehabilitation  PERTINENT HISTORY: Alcohol abuse, bipolar disorder, chronic pancreatitis, polysubstance abuse, hyperlipidemia, hypertension; see additional PMH above   PRECAUTIONS: None    SUBJECTIVE:         SUBJECTIVE STATEMENT:  Patient reports he is feeling pretty good. He felt loosened up and some relief after last visit. He sees the doctor again today.  PAIN:  Are you having pain? Yes:  NPRS scale: 5/10 Pain location: Low back Pain description: Achy, sharp Aggravating factors: Right side lying, walking 3-5 minutes, stairs Relieving factors:  Sitting, pain cream/ scrub, hot bath, epsom salt   OBJECTIVE: (objective measures completed at initial evaluation unless otherwise dated) PATIENT SURVEYS:  Modified Oswestry 72% disability (36/50)   MUSCLE LENGTH: Patient demonstrates limitations in hamstring and piriformis flexibility   POSTURE:             Rounded shoulders, decreased lumbar lordosis   PALPATION: Tender to palpation bilateral lumbar paraspinals, right gluteal regon   LUMBAR ROM:    AROM eval  Flexion WFL  Extension WFL  Right lateral flexion WFL  Left lateral flexion WFL  Right rotation WFL  Left rotation WFL   (Blank rows = not tested) Patient reports right low back pulling with right side bend  LOWER EXTREMITY MMT:     MMT Right eval Left eval Rt / Lt 06/20/2022  Hip flexion 4- 4-   Hip extension 3+ 4- 4- / 4-  Hip abduction 3+ 4- 4- / 4-  Hip adduction       Hip internal rotation       Hip external rotation       Knee flexion 4 4   Knee extension 4+ 5   Ankle dorsiflexion 5 5   Ankle plantarflexion       Ankle inversion       Ankle eversion 4 4    (Blank rows = not tested)  Myotomes testing grossly WFL   LUMBAR SPECIAL TESTS:  Straight leg raise test: Negative   FUNCTIONAL TESTS:  Not assessed   GAIT: Assistive device utilized: None Level of assistance: Complete Independence Comments: mildly antalgic on right     TODAY'S TREATMENT:        [facility] Adult PT Treatment:                                                DATE: 07/04/2022 Therapeutic Exercise: NuStep L7 x 5 min with UE/LE while taking subjective Seated hamstring stretch 2 x 30 sec each LTR in figure-4 position x 5 each Supine piriformis pull/push stretch 2 x 30 sec each Bridge 2 x 10 90-90 alternating foot tap 2 x 10 Sidelying hip abduction 2 x 15 each Modified side plank on knees 5 x 10 sec each Modified front plank on knees 5 x 10 sec each Sit to stand 2 x 15 Pallof press with green tubing 2 x 10  each Deadlift 30# 3 x 10   [facility] Adult PT Treatment:                                                DATE: 06/20/2022 Therapeutic Exercise: NuStep L7 x 5 min with UE/LE while taking subjective Seated hamstring stretch 2 x 30 sec each LTR in figure-4 position x 5 each Supine piriformis pull/push stretch 2 x 30 sec each Bridge 2 x 10 90-90 alternating foot tap 2 x 10 Sidelying hip abduction 2 x 15 each Modified side plank on knees 5 x 10 sec each Modified front plank on knees 5 x 10 sec each Sit to stand 2 x 15 Pallof press with green tubing 2 x 10 each Deadlift 30# 3 x 10  [facility] Adult PT Treatment:                                                DATE: 06/16/2022 Therapeutic Exercise: NuStep L6 x 5 min with UE/LE while taking subjective LTR x 10 focused on core control Supine piriformis pull/push stretch 2 x 30 sec each Bridge 2 x 10 SLR 2 x 10 Hooklying clamshell with blue 2 x 10 90-90 hold 10 x 10 sec Sidelying hip abduction 2 x 10 Deadlift 25# from 4" box 2 x 10 Pallof press with yellow elastispring 2 x 10 each   PATIENT EDUCATION:  Education details: HEP Person educated: Patient Education method: Consulting civil engineer, Demonstration, Tactile cues, Verbal cues, and Handouts Education comprehension: verbalized understanding, returned demonstration, verbal cues required, tactile cues required, and needs further education   HOME EXERCISE PROGRAM: Access Code: [id number]      ASSESSMENT: CLINICAL IMPRESSION: Patient tolerated therapy well with no adverse effects. *** Patient would benefit from continued skilled PT to progress his mobility and strength in order to reduce pain and maximize functional ability.  Therapy continues to focus on progressing mobility and core and hip strengthening with good tolerance. He reports improvement in symptoms following therapy and denies any increase in pain with exercises. He was able to progress with his core stabilization exercises  and lifting, but does  require continued cueing for lifting technique and avoiding excessive lumbar flexion with lifting. No changes to HEP this visit.     OBJECTIVE IMPAIRMENTS: decreased activity tolerance, decreased ROM, decreased strength, impaired flexibility, and pain.    ACTIVITY LIMITATIONS: lifting, standing, sleeping, stairs, and locomotion level   PARTICIPATION LIMITATIONS: meal prep, cleaning, laundry, community activity, and occupation   PERSONAL FACTORS: Behavior pattern, Fitness, Past/current experiences, Social background, Time since onset of injury/illness/exacerbation, and 3+ comorbidities: see additional PMH above  are also affecting patient's functional outcome.      GOALS: Goals reviewed with patient? Yes   SHORT TERM GOALS: Target date: 07/04/2022   Patient will be I with initial HEP in order to progress with therapy. Baseline: HEP provided at eval Goal status: INITIAL   2.  Patient will ability to walk >/= 10 minutes without having to sit due to pain in order to improve community access Baseline: 3-5 minutes Goal status: INITIAL   3.  Patient will report low back and right hip pain </= 4/10 in order to reduce functional limitations Baseline: 7/10 pain Goal status: INITIAL   LONG TERM GOALS: Target date: 08/01/2022   Patient will be I with final HEP to maintain progress from PT. Baseline: HEP provided at eval Goal status: INITIAL   2.  Patient will report modified ODI </= 50% disability in order to indiciate improve in his functional ability with tasks such as lifting, household activities, standing, and walking Baseline: 72% disability Goal status: INITIAL   3.  Patient will demonstrate right hip strength grossly >/= 4/5 MMT in order to improve walking tolerance and stair negotiation Baseline: right hip strength grossly 3+/5 MMT Goal status: INITIAL   4.  Patient will report ability to walk >/= 30 minutes with needing rest break to improve ability to go grocery shopping or  walking for exercise Baseline: 3-5 min at eval Goal status: INITIAL   5.  Patient will report low back and right hip pain </= 2/10 in order to reduce functional limitations Baseline: 7/10 at eval Goal status: INITIAL     PLAN: PT FREQUENCY: 1x/week   PT DURATION: 8 weeks   PLANNED INTERVENTIONS: Therapeutic exercises, Therapeutic activity, Neuromuscular re-education, Balance training, Gait training, Patient/Family education, Self Care, Joint mobilization, Joint manipulation, Aquatic Therapy, Dry Needling, Electrical stimulation, Spinal manipulation, Spinal mobilization, Cryotherapy, Moist heat, Ionotophoresis '4mg'$ /ml Dexamethasone, Manual therapy, and Re-evaluation.   PLAN FOR NEXT SESSION: Review HEP and progress PRN, stretching for hip and LE, progress core, hip and LE strengthening   Hilda Blades, PT, DPT, LAT, ATC 07/01/22  12:33 PM Phone: [phone] Fax: [phone]

## 2022-07-04 ENCOUNTER — Ambulatory Visit: Payer: Medicaid Other | Admitting: Physical Therapy

## 2022-07-08 ENCOUNTER — Other Ambulatory Visit: Payer: Self-pay

## 2022-07-08 ENCOUNTER — Emergency Department (HOSPITAL_COMMUNITY): Payer: Medicaid Other

## 2022-07-08 ENCOUNTER — Emergency Department (HOSPITAL_COMMUNITY)
Admission: EM | Admit: 2022-07-08 | Discharge: 2022-07-08 | Disposition: A | Discharge status: Home / Self Care | Payer: Medicaid Other | Attending: Emergency Medicine | Admitting: Emergency Medicine

## 2022-07-08 DIAGNOSIS — Z79899 Other long term (current) drug therapy: Secondary | ICD-10-CM | POA: Diagnosis not present

## 2022-07-08 DIAGNOSIS — J45909 Unspecified asthma, uncomplicated: Secondary | ICD-10-CM | POA: Diagnosis not present

## 2022-07-08 DIAGNOSIS — R1013 Epigastric pain: Secondary | ICD-10-CM | POA: Diagnosis not present

## 2022-07-08 DIAGNOSIS — R072 Precordial pain: Secondary | ICD-10-CM | POA: Insufficient documentation

## 2022-07-08 DIAGNOSIS — I10 Essential (primary) hypertension: Secondary | ICD-10-CM | POA: Insufficient documentation

## 2022-07-08 DIAGNOSIS — R079 Chest pain, unspecified: Secondary | ICD-10-CM | POA: Diagnosis present

## 2022-07-08 DIAGNOSIS — Z91040 Latex allergy status: Secondary | ICD-10-CM | POA: Insufficient documentation

## 2022-07-08 MED ORDER — SUCRALFATE 1 GM/10ML PO SUSP: 1.0000 g | Freq: Three times a day (TID) | ORAL | 0 refills | Status: DC

## 2022-07-08 MED ORDER — ONDANSETRON HCL 4 MG/2ML IJ SOLN
4.0000 mg | Freq: Once | INTRAMUSCULAR | Status: AC
  Administered 2022-07-08: 4 mg via INTRAVENOUS
  Filled 2022-07-08: qty 2

## 2022-07-08 MED ORDER — PROCHLORPERAZINE EDISYLATE 10 MG/2ML IJ SOLN
10.0000 mg | Freq: Once | INTRAMUSCULAR | Status: AC
  Administered 2022-07-08: 10 mg via INTRAVENOUS
  Filled 2022-07-08: qty 2

## 2022-07-08 MED ORDER — LIDOCAINE VISCOUS HCL 2 % MT SOLN: 15.0000 mL | Freq: Four times a day (QID) | OROMUCOSAL | 0 refills | Status: DC | PRN

## 2022-07-08 MED ORDER — PANCRELIPASE (LIP-PROT-AMYL) 36000-114000 UNITS PO CPEP: ORAL_CAPSULE | ORAL | 0 refills | Status: DC

## 2022-07-08 NOTE — Discharge Instructions (Signed)

## 2022-07-08 NOTE — ED Provider Notes (Signed)
[facility] Provider Note   CSN: [id number] Arrival date & time: 07/08/22  0510     History  Chief Complaint  Patient presents with   Chest Pain    [name] is a 52 y.o. male.  The history is provided by the patient.  Patient with history of chronic pancreatitis, chronic pain, hypertension, Wolff-Parkinson-White s/p ablation presents with chest and abdominal pain.  Patient reports he woke up several hours ago having chest pain going to his abdomen that he feels might be pancreatitis.  No fevers or vomiting.  He has had some loose diarrhea without any blood.  No cough but has had some shortness of breath.  Patient denies any recent alcohol use.  Denies any recent cocaine use    Past Medical History:  Diagnosis Date   Alcoholism /alcohol abuse    Anemia    Anxiety    Arthritis    knees; arms; elbows (03/26/2015)   Asthma    Bipolar disorder (HCC)    Chronic bronchitis (HCC)    Chronic lower back pain    Chronic pancreatitis ([place])    Cocaine abuse ([place])    Depression    Family history of adverse reaction to anesthesia    Femoral condyle fracture ([place]) 03/08/2014   left medial/notes 03/09/2014   GERD (gastroesophageal reflux disease)    H/O hiatal hernia    H/O suicide attempt 10/2012   High cholesterol    History of blood transfusion 10/2012   when I tried to commit suicide   History of stomach ulcers    Hypertension    Marijuana abuse, continuous    Migraine    a few times/year (03/26/2015)   Pneumonia 1990's X 3   PTSD (post-traumatic stress disorder)    Sickle cell trait (HCC)    WPW (Wolff-Parkinson-White syndrome)    [name] 03/06/2013    Home Medications Prior to Admission medications   Medication Sig Start Date End Date Taking? Authorizing Provider  lidocaine (XYLOCAINE) 2 % solution Use as directed 15 mLs in the mouth or throat every 6 (six) hours as needed for mouth pain. 07/08/22  Yes Ripley Fraise, MD  lipase/protease/amylase (CREON) 36000 UNITS CPEP capsule Take 2 capsules (72,000 Units total) by mouth 3 (three) times daily with meals. May also take 1 capsule (36,000 Units total) as needed (with snacks). 07/08/22  Yes Ripley Fraise, MD  sucralfate (CARAFATE) 1 GM/10ML suspension Take 10 mLs (1 g total) by mouth 4 (four) times daily -  with meals and at bedtime. 07/08/22  Yes Ripley Fraise, MD  acetaminophen (TYLENOL) 500 MG tablet Take 1,000 mg by mouth every 6 (six) hours as needed for moderate pain or headache.    [provider]  acetaminophen (TYLENOL) 650 MG CR tablet Take 1 tablet (650 mg total) by mouth every 8 (eight) hours as needed for pain. 03/05/22   Nyoka Lint, PA-C  albuterol (VENTOLIN HFA) 108 (90 Base) MCG/ACT inhaler Inhale 2 puffs into the lungs every 4 (four) hours as needed for wheezing or shortness of breath. 08/17/21   [name], MD  amLODipine (NORVASC) 10 MG tablet Take 1 tablet (10 mg total) by mouth daily. 12/25/21   [name], MD  benzonatate (TESSALON) 100 MG capsule Take 1 capsule (100 mg total) by mouth 3 (three) times daily as needed for cough. 03/14/22   Long, Wonda Olds, MD  dextromethorphan-guaiFENesin [facility] DM) 30-600 MG 12hr tablet Take 1 tablet  by mouth 2 (two) times daily. 03/05/22   Nyoka Lint, PA-C  dicyclomine (BENTYL) 10 MG capsule Take 1 capsule (10 mg total) by mouth 2 (two) times daily. 03/15/22   [name], [name], MD  DULoxetine (CYMBALTA) 30 MG capsule Take 2 capsules (60 mg total) by mouth at bedtime. Take 1 capsule daily at bedtime for 2 weeks and then increase to 2 capsules at bedtime 04/06/22   [name], [name], MD  folic acid (FOLVITE) 1 MG tablet Take 1 tablet (1 mg total) by mouth daily. 11/26/17   [name], MD  HYDROcodone-acetaminophen (NORCO/VICODIN) 5-325 MG tablet Take 1 tablet by mouth every 12 (twelve) hours as needed for moderate pain. 12/24/21   [name], MD  lidocaine (LMX)  4 % cream Apply topically 4 (four) times daily as needed (NGT pain). 12/24/21   [name], MD  loperamide (IMODIUM) 2 MG capsule Take 1 capsule (2 mg total) by mouth 4 (four) times daily as needed for diarrhea or loose stools. 02/17/22   Tegeler, Gwenyth Allegra, MD  magnesium oxide (MAG-OX) 400 (240 Mg) MG tablet Take 1 tablet (400 mg total) by mouth daily. 11/30/21   Davonna Belling, MD  metoprolol succinate (TOPROL XL) 25 MG 24 hr tablet Take 1 tablet (25 mg total) by mouth as needed (for palpitations). To not exceed 1 dose per day. Patient taking differently: Take 50 mg by mouth daily. 01/06/22   Duke, Tami Lin, PA  metoprolol succinate (TOPROL-XL) 200 MG 24 hr tablet Take 1 tablet (200 mg total) by mouth daily. Take with or immediately following a meal. 02/14/22   Minus Breeding, MD  Multiple Vitamin (MULTIVITAMIN WITH MINERALS) TABS tablet Take 1 tablet by mouth daily. 09/06/17   Bonnell Public, MD  nicotine (NICODERM CQ - DOSED IN MG/24 HOURS) 21 mg/24hr patch Place 1 patch (21 mg total) onto the skin at bedtime. 12/24/21   [name], MD  nicotine polacrilex (NICORETTE STARTER KIT) 4 MG gum Take 1 each (4 mg total) by mouth as needed for smoking cessation. 03/15/22   [name], [name], MD  nitroGLYCERIN (NITROSTAT) 0.4 MG SL tablet Place 1 tablet (0.4 mg total) under the tongue every 5 (five) minutes as needed for chest pain. Do not exceed 3 doses in a row. 01/06/22   Duke, Tami Lin, PA  ondansetron (ZOFRAN) 4 MG tablet Take 1 tablet (4 mg total) by mouth every 8 (eight) hours as needed for nausea or vomiting. 05/18/22   [name], [name], MD  oxyCODONE (ROXICODONE) 5 MG immediate release tablet Take 1 tablet (5 mg total) by mouth every 4 (four) hours as needed for severe pain. 02/17/22   Tegeler, Gwenyth Allegra, MD  oxyCODONE-acetaminophen (PERCOCET/ROXICET) 5-325 MG tablet Take 1 tablet by mouth every 6 (six) hours as needed for severe pain. 03/14/22   Long, Wonda Olds,  MD  pantoprazole (PROTONIX) 40 MG tablet Take 1 tablet (40 mg total) by mouth 2 (two) times daily. 06/27/22   Tedd Sias, PA  Polyethyl Glycol-Propyl Glycol (SYSTANE OP) Place 1 drop into both eyes 4 (four) times daily as needed (for dryness).    [provider]  thiamine 100 MG tablet Take 1 tablet (100 mg total) by mouth daily. 10/16/18   [name], MD  Vitamin D, Ergocalciferol, (DRISDOL) 1.25 MG (50000 UNIT) CAPS capsule Take 50,000 Units by mouth every Wednesday.    [provider]  amitriptyline (ELAVIL) 25 MG tablet Take 1 tablet (25  mg total) by mouth at bedtime. Patient not taking: Reported on 08/08/2019 10/15/18 08/08/19  [name], MD      Allergies    Robaxin [methocarbamol], Aspirin, Shellfish-derived products, Trazodone and nefazodone, Adhesive [tape], Latex, Toradol [ketorolac tromethamine], Contrast media [iodinated contrast media], Reglan [metoclopramide], and Salmon [fish oil]    Review of Systems   Review of Systems  Constitutional:  Negative for fever.  Cardiovascular:  Positive for chest pain.  Gastrointestinal:  Positive for abdominal pain and diarrhea. Negative for blood in stool and vomiting.    Physical Exam Updated Vital Signs BP (!) 141/94   Pulse 96   Temp 98.6 F (37 C) (Oral)   Resp 18   Ht 1.753 m ('5\' 9"'$ )   Wt 61.2 kg   SpO2 100%   BMI 19.94 kg/m  Physical Exam CONSTITUTIONAL: Disheveled, no acute distress HEAD: Normocephalic/atraumatic EYES: EOMI/PERRL ENMT: Mucous membranes moist NECK: supple no meningeal signs SPINE/BACK:entire spine nontender CV: S1/S2 noted, no murmurs/rubs/gallops noted LUNGS: Lungs are clear to auscultation bilaterally, no apparent distress ABDOMEN: soft, mild diffuse tenderness, no rebound or guarding, bowel sounds noted throughout abdomen GU:no cva tenderness NEURO: Pt is awake/alert/appropriate, moves all extremitiesx4.  No facial droop.   EXTREMITIES: pulses normal/equalx4, full ROM SKIN:  warm, color normal PSYCH: no abnormalities of mood noted, alert and oriented to situation  ED Results / Procedures / Treatments   Labs (all labs ordered are listed, but only abnormal results are displayed) Labs Reviewed - No data to display  EKG EKG Interpretation  Date/Time:  Friday July 08 2022 05:18:03 EST Ventricular Rate:  91 PR Interval:  94 QRS Duration: 82 QT Interval:  329 QTC Calculation: 405 R Axis:   77 Text Interpretation: Sinus tachycardia Multiform ventricular premature complexes Sinus pause with ventricular escape Short PR interval Left atrial enlargement Interpretation limited secondary to artifact Confirmed by Ripley Fraise [phone]) on 07/08/2022 5:23:08 AM  Radiology DG Chest Port 1 View  Result Date: 07/08/2022 CLINICAL DATA:  Substernal chest pain EXAM: PORTABLE CHEST 1 VIEW COMPARISON:  06/27/2022 FINDINGS: The lungs are clear without focal pneumonia, edema, pneumothorax or pleural effusion. The cardiopericardial silhouette is within normal limits for size. The visualized bony structures of the thorax are unremarkable. Telemetry leads overlie the chest. IMPRESSION: No active disease. Electronically Signed   By: Misty Stanley M.D.   On: 07/08/2022 06:10    Procedures Procedures    Medications Ordered in ED Medications  ondansetron (ZOFRAN) injection 4 mg (4 mg Intravenous Given 07/08/22 0553)  prochlorperazine (COMPAZINE) injection 10 mg (10 mg Intravenous Given 07/08/22 0602)    ED Course/ Medical Decision Making/ A&P Clinical Course as of 07/08/22 0646  Fri Jul 08, 2022  0611 Patient with multiple ED visits in the past 6 months typically presents with chest and abdominal pain.  He has had extensive evaluation previously.  He is followed by cardiology, symptoms were not likely to be ischemic in nature.  Will check chest x-ray and reassess [DW]  0645 Patient stable in the ER.  No significant tachycardic episodes.  He is safe for discharge home.  This is likely  chronic pain.  He requests refill for Creon, viscous lidocaine and Carafate [DW]    Clinical Course User Index [DW] Ripley Fraise, MD                             Medical Decision Making Amount and/or Complexity of Data Reviewed Radiology:  ordered.  Risk Prescription drug management.   This patient presents to the ED for concern of chest pain, this involves an extensive number of treatment options, and is a complaint that carries with it a high risk of complications and morbidity.  The differential diagnosis includes but is not limited to acute coronary syndrome, aortic dissection, pulmonary embolism, pericarditis, pneumothorax, pneumonia, myocarditis, pleurisy, esophageal rupture, pancreatitis    Comorbidities that complicate the patient evaluation: Patient's presentation is complicated by their history of chronic pain, substance use  Social Determinants of Health: Patient's  history of substance use disorder   increases the complexity of managing their presentation  Additional history obtained: Records reviewed  cardiology records reviewed  Imaging Studies ordered: I ordered imaging studies including X-ray chest   I independently visualized and interpreted imaging which showed no acute finding I agree with the radiologist interpretation   Medicines ordered and prescription drug management: I ordered medication including Compazine for abdominal pain and nausea Reevaluation of the patient after these medicines showed that the patient    stayed the same  Reevaluation: After the interventions noted above, I reevaluated the patient and found that they have :stayed the same  Complexity of problems addressed: Patient's presentation is most consistent with  exacerbation of chronic illness  Disposition: After consideration of the diagnostic results and the patient's response to treatment,  I feel that the patent would benefit from discharge   .           Final  Clinical Impression(s) / ED Diagnoses Final diagnoses:  Precordial pain  Epigastric pain    Rx / DC Orders ED Discharge Orders          Ordered    lidocaine (XYLOCAINE) 2 % solution  Every 6 hours PRN        07/08/22 0644    lipase/protease/amylase (CREON) 36000 UNITS CPEP capsule  Multiple Frequencies        07/08/22 0644    sucralfate (CARAFATE) 1 GM/10ML suspension  3 times daily with meals & bedtime        07/08/22 [id number]              Ripley Fraise, MD 07/08/22 [phone]

## 2022-07-08 NOTE — ED Triage Notes (Signed)
Pt arrives to ED c/o substernal CP since 2300 that has gotten worse in last hour. Pt was given '6mg'$  of morphine and '4mg'$  of zofran. Pt with PAC and PVC per EMS. Pt w/ BP of 190/94, hr in 100's. Pt endorses dizziness

## 2022-07-19 ENCOUNTER — Emergency Department (HOSPITAL_COMMUNITY): Payer: Medicaid Other

## 2022-07-19 ENCOUNTER — Other Ambulatory Visit: Payer: Self-pay

## 2022-07-19 ENCOUNTER — Inpatient Hospital Stay (HOSPITAL_COMMUNITY)
Admission: EM | Admit: 2022-07-19 | Discharge: 2022-07-23 | DRG: 918 | Disposition: A | Discharge status: Home / Self Care | Payer: Medicaid Other | Attending: Internal Medicine | Admitting: Internal Medicine

## 2022-07-19 DIAGNOSIS — I472 Ventricular tachycardia, unspecified: Secondary | ICD-10-CM | POA: Diagnosis not present

## 2022-07-19 DIAGNOSIS — T405X1A Poisoning by cocaine, accidental (unintentional), initial encounter: Principal | ICD-10-CM | POA: Diagnosis present

## 2022-07-19 DIAGNOSIS — Z8249 Family history of ischemic heart disease and other diseases of the circulatory system: Secondary | ICD-10-CM

## 2022-07-19 DIAGNOSIS — R000 Tachycardia, unspecified: Principal | ICD-10-CM | POA: Diagnosis present

## 2022-07-19 DIAGNOSIS — I10 Essential (primary) hypertension: Secondary | ICD-10-CM | POA: Diagnosis present

## 2022-07-19 DIAGNOSIS — Z91041 Radiographic dye allergy status: Secondary | ICD-10-CM

## 2022-07-19 DIAGNOSIS — K860 Alcohol-induced chronic pancreatitis: Secondary | ICD-10-CM | POA: Diagnosis present

## 2022-07-19 DIAGNOSIS — Z8701 Personal history of pneumonia (recurrent): Secondary | ICD-10-CM

## 2022-07-19 DIAGNOSIS — E7800 Pure hypercholesterolemia, unspecified: Secondary | ICD-10-CM | POA: Diagnosis present

## 2022-07-19 DIAGNOSIS — F1410 Cocaine abuse, uncomplicated: Secondary | ICD-10-CM | POA: Diagnosis present

## 2022-07-19 DIAGNOSIS — Z886 Allergy status to analgesic agent status: Secondary | ICD-10-CM

## 2022-07-19 DIAGNOSIS — Z7141 Alcohol abuse counseling and surveillance of alcoholic: Secondary | ICD-10-CM

## 2022-07-19 DIAGNOSIS — Z811 Family history of alcohol abuse and dependence: Secondary | ICD-10-CM

## 2022-07-19 DIAGNOSIS — F17210 Nicotine dependence, cigarettes, uncomplicated: Secondary | ICD-10-CM | POA: Diagnosis present

## 2022-07-19 DIAGNOSIS — D573 Sickle-cell trait: Secondary | ICD-10-CM | POA: Diagnosis present

## 2022-07-19 DIAGNOSIS — F1910 Other psychoactive substance abuse, uncomplicated: Secondary | ICD-10-CM | POA: Diagnosis present

## 2022-07-19 DIAGNOSIS — E876 Hypokalemia: Secondary | ICD-10-CM | POA: Diagnosis present

## 2022-07-19 DIAGNOSIS — G894 Chronic pain syndrome: Secondary | ICD-10-CM | POA: Diagnosis present

## 2022-07-19 DIAGNOSIS — Z8711 Personal history of peptic ulcer disease: Secondary | ICD-10-CM

## 2022-07-19 DIAGNOSIS — F319 Bipolar disorder, unspecified: Secondary | ICD-10-CM | POA: Diagnosis present

## 2022-07-19 DIAGNOSIS — R7989 Other specified abnormal findings of blood chemistry: Secondary | ICD-10-CM | POA: Diagnosis present

## 2022-07-19 DIAGNOSIS — Z7151 Drug abuse counseling and surveillance of drug abuser: Secondary | ICD-10-CM

## 2022-07-19 DIAGNOSIS — R569 Unspecified convulsions: Secondary | ICD-10-CM

## 2022-07-19 DIAGNOSIS — F10139 Alcohol abuse with withdrawal, unspecified: Secondary | ICD-10-CM | POA: Diagnosis present

## 2022-07-19 DIAGNOSIS — G40509 Epileptic seizures related to external causes, not intractable, without status epilepticus: Secondary | ICD-10-CM | POA: Diagnosis present

## 2022-07-19 DIAGNOSIS — Z91040 Latex allergy status: Secondary | ICD-10-CM

## 2022-07-19 DIAGNOSIS — Z1152 Encounter for screening for COVID-19: Secondary | ICD-10-CM

## 2022-07-19 DIAGNOSIS — M6282 Rhabdomyolysis: Secondary | ICD-10-CM | POA: Diagnosis present

## 2022-07-19 DIAGNOSIS — K7010 Alcoholic hepatitis without ascites: Secondary | ICD-10-CM | POA: Diagnosis present

## 2022-07-19 DIAGNOSIS — F17290 Nicotine dependence, other tobacco product, uncomplicated: Secondary | ICD-10-CM | POA: Diagnosis present

## 2022-07-19 DIAGNOSIS — F1210 Cannabis abuse, uncomplicated: Secondary | ICD-10-CM | POA: Diagnosis present

## 2022-07-19 DIAGNOSIS — Z808 Family history of malignant neoplasm of other organs or systems: Secondary | ICD-10-CM

## 2022-07-19 DIAGNOSIS — R636 Underweight: Secondary | ICD-10-CM | POA: Diagnosis present

## 2022-07-19 DIAGNOSIS — E872 Acidosis, unspecified: Secondary | ICD-10-CM | POA: Diagnosis present

## 2022-07-19 DIAGNOSIS — Z79899 Other long term (current) drug therapy: Secondary | ICD-10-CM

## 2022-07-19 DIAGNOSIS — R55 Syncope and collapse: Secondary | ICD-10-CM | POA: Diagnosis present

## 2022-07-19 DIAGNOSIS — R197 Diarrhea, unspecified: Secondary | ICD-10-CM | POA: Diagnosis present

## 2022-07-19 DIAGNOSIS — Z681 Body mass index (BMI) 19 or less, adult: Secondary | ICD-10-CM

## 2022-07-19 DIAGNOSIS — J4489 Other specified chronic obstructive pulmonary disease: Secondary | ICD-10-CM | POA: Diagnosis present

## 2022-07-19 DIAGNOSIS — Y92009 Unspecified place in unspecified non-institutional (private) residence as the place of occurrence of the external cause: Secondary | ICD-10-CM

## 2022-07-19 DIAGNOSIS — Z91013 Allergy to seafood: Secondary | ICD-10-CM

## 2022-07-19 DIAGNOSIS — K863 Pseudocyst of pancreas: Secondary | ICD-10-CM | POA: Diagnosis present

## 2022-07-19 DIAGNOSIS — Z888 Allergy status to other drugs, medicaments and biological substances status: Secondary | ICD-10-CM

## 2022-07-19 DIAGNOSIS — F1010 Alcohol abuse, uncomplicated: Secondary | ICD-10-CM

## 2022-07-19 DIAGNOSIS — I480 Paroxysmal atrial fibrillation: Secondary | ICD-10-CM | POA: Diagnosis present

## 2022-07-19 LAB — CBC WITH DIFFERENTIAL/PLATELET
Abs Immature Granulocytes: 0.12 10*3/uL — ABNORMAL HIGH (ref 0.00–0.07)
Basophils Absolute: 0 10*3/uL (ref 0.0–0.1)
Basophils Relative: 0 %
Eosinophils Absolute: 0.1 10*3/uL (ref 0.0–0.5)
Eosinophils Relative: 1 %
HCT: 33 % — ABNORMAL LOW (ref 39.0–52.0)
Hemoglobin: 10.7 g/dL — ABNORMAL LOW (ref 13.0–17.0)
Immature Granulocytes: 1 %
Lymphocytes Relative: 30 %
Lymphs Abs: 2.5 10*3/uL (ref 0.7–4.0)
MCH: 31.5 pg (ref 26.0–34.0)
MCHC: 32.4 g/dL (ref 30.0–36.0)
MCV: 97.1 fL (ref 80.0–100.0)
Monocytes Absolute: 0.8 10*3/uL (ref 0.1–1.0)
Monocytes Relative: 10 %
Neutro Abs: 4.9 10*3/uL (ref 1.7–7.7)
Neutrophils Relative %: 58 %
Platelets: 132 10*3/uL — ABNORMAL LOW (ref 150–400)
RBC: 3.4 MIL/uL — ABNORMAL LOW (ref 4.22–5.81)
RDW: 16.6 % — ABNORMAL HIGH (ref 11.5–15.5)
WBC: 8.3 10*3/uL (ref 4.0–10.5)
nRBC: 0.2 % (ref 0.0–0.2)

## 2022-07-19 LAB — CK: Total CK: 634 U/L — ABNORMAL HIGH (ref 49–397)

## 2022-07-19 LAB — COMPREHENSIVE METABOLIC PANEL
ALT: 56 U/L — ABNORMAL HIGH (ref 0–44)
AST: 175 U/L — ABNORMAL HIGH (ref 15–41)
Albumin: 3.9 g/dL (ref 3.5–5.0)
Alkaline Phosphatase: 96 U/L (ref 38–126)
Anion gap: 23 — ABNORMAL HIGH (ref 5–15)
BUN: <5 mg/dL — ABNORMAL LOW (ref 6–20)
CO2: 10 mmol/L — ABNORMAL LOW (ref 22–32)
Calcium: 5.9 mg/dL — CL (ref 8.9–10.3)
Chloride: 103 mmol/L (ref 98–111)
Creatinine, Ser: 1.27 mg/dL — ABNORMAL HIGH (ref 0.61–1.24)
GFR, Estimated: >60 mL/min (ref >60)
Glucose, Bld: 172 mg/dL — ABNORMAL HIGH (ref 70–99)
Potassium: 3.6 mmol/L (ref 3.5–5.1)
Sodium: 136 mmol/L (ref 135–145)
Total Bilirubin: 0.4 mg/dL (ref 0.3–1.2)
Total Protein: 7.2 g/dL (ref 6.5–8.1)

## 2022-07-19 LAB — TSH: TSH: 2.004 u[IU]/mL (ref 0.350–4.500)

## 2022-07-19 LAB — ETHANOL: Alcohol, Ethyl (B): <10 mg/dL (ref <10)

## 2022-07-19 LAB — T4, FREE: Free T4: 0.59 ng/dL — ABNORMAL LOW (ref 0.61–1.12)

## 2022-07-19 LAB — TROPONIN I (HIGH SENSITIVITY)
Troponin I (High Sensitivity): 15 ng/L (ref <18)
Troponin I (High Sensitivity): 20 ng/L — ABNORMAL HIGH (ref <18)

## 2022-07-19 LAB — RESP PANEL BY RT-PCR (RSV, FLU A&B, COVID)  RVPGX2
Influenza A by PCR: NEGATIVE
Influenza B by PCR: NEGATIVE
Resp Syncytial Virus by PCR: NEGATIVE
SARS Coronavirus 2 by RT PCR: NEGATIVE

## 2022-07-19 LAB — LIPASE, BLOOD: Lipase: 27 U/L (ref 11–51)

## 2022-07-19 MED ORDER — SODIUM CHLORIDE 0.9 % IV BOLUS
1000.0000 mL | Freq: Once | INTRAVENOUS | Status: AC
  Administered 2022-07-19: 1000 mL via INTRAVENOUS

## 2022-07-19 MED ORDER — KETOROLAC TROMETHAMINE 30 MG/ML IJ SOLN
30.0000 mg | Freq: Once | INTRAMUSCULAR | Status: AC
  Administered 2022-07-19: 30 mg via INTRAVENOUS
  Filled 2022-07-19: qty 1

## 2022-07-19 MED ORDER — FAMOTIDINE IN NACL 20-0.9 MG/50ML-% IV SOLN
20.0000 mg | Freq: Once | INTRAVENOUS | Status: AC
  Administered 2022-07-19: 20 mg via INTRAVENOUS
  Filled 2022-07-19: qty 50

## 2022-07-19 MED ORDER — CALCIUM GLUCONATE-NACL 1-0.675 GM/50ML-% IV SOLN
1.0000 g | Freq: Once | INTRAVENOUS | Status: AC
  Administered 2022-07-19: 1000 mg via INTRAVENOUS
  Filled 2022-07-19: qty 50

## 2022-07-19 MED ORDER — METOPROLOL TARTRATE 5 MG/5ML IV SOLN
5.0000 mg | Freq: Once | INTRAVENOUS | Status: AC
  Administered 2022-07-20: 5 mg via INTRAVENOUS
  Filled 2022-07-19: qty 5

## 2022-07-19 MED ORDER — LORAZEPAM 1 MG PO TABS
2.0000 mg | ORAL_TABLET | Freq: Once | ORAL | Status: AC
  Administered 2022-07-19: 2 mg via ORAL
  Filled 2022-07-19: qty 2

## 2022-07-19 MED ORDER — AMLODIPINE BESYLATE 5 MG PO TABS
10.0000 mg | ORAL_TABLET | Freq: Once | ORAL | Status: AC
  Administered 2022-07-19: 10 mg via ORAL
  Filled 2022-07-19: qty 2

## 2022-07-19 NOTE — ED Notes (Signed)
[name] pt son would like a update

## 2022-07-19 NOTE — ED Provider Notes (Incomplete)
[facility] Provider Note  CSN: [id number] Arrival date & time: 07/19/22 1936  Chief Complaint(s) Seizures  HPI [name] is a 52 y.o. male with past medical history as below, significant for polysubstance use, chronic alcohol abuse, WPW, chronic pancreatitis, bipolar disorder, asthma, hypertension, verbal compliance medications who presents to the ED with complaint of ?seizure.  Patient with seizure activity reported by bystander/friend.  On eMS arrival he was reportedly postictal, he was given Versed secondary to agitation.  On patient arrival he appears postictal, unclear of events leading up to arrival to ED today.  Unable to contribute much to medical history, does report some ongoing abdominal pain which does appear to be chronic   Level 5 caveat ams  Past Medical History Past Medical History:  Diagnosis Date   Alcoholism /alcohol abuse    Anemia    Anxiety    Arthritis    knees; arms; elbows (03/26/2015)   Asthma    Bipolar disorder (HCC)    Chronic bronchitis (HCC)    Chronic lower back pain    Chronic pancreatitis ([place])    Cocaine abuse ([place])    Depression    Family history of adverse reaction to anesthesia    Femoral condyle fracture ([place]) 03/08/2014   left medial/notes 03/09/2014   GERD (gastroesophageal reflux disease)    H/O hiatal hernia    H/O suicide attempt 10/2012   High cholesterol    History of blood transfusion 10/2012   when I tried to commit suicide   History of stomach ulcers    Hypertension    Marijuana abuse, continuous    Migraine    a few times/year (03/26/2015)   Pneumonia 1990's X 3   PTSD (post-traumatic stress disorder)    Sickle cell trait ([place])    WPW (Wolff-Parkinson-White syndrome)    [name] 03/06/2013   Patient Active Problem List   Diagnosis Date Noted   NSVT (nonsustained ventricular tachycardia) ([place]) 01/20/2022   Normal anion gap metabolic acidosis [id number]    Hypoalbuminemia 12/08/2021   Acute recurrent pancreatitis 12/07/2021   WPW (Wolff-Parkinson-White syndrome) 12/06/2021   Paroxysmal atrial fibrillation with RVR ([place]) 12/06/2021   Pulmonary nodule 12/06/2021   Leukocytosis 06/07/2021   Epigastric pain 0000000   Acute alcoholic pancreatitis 0000000   Urinary retention    Protein-calorie malnutrition, severe 11/17/2020   Acute pancreatitis 11/15/2020   Hypomagnesemia    Hypocalcemia 10/23/2020   AKI (acute kidney injury) ([place]) 11/13/2018   Seizure ([place]) 11/13/2018   Chest pain 01/08/2018   Acute on chronic pancreatitis ([place]) 09/28/2017   Abdominal pain 05/27/2017   Nausea & vomiting 03/18/2017   Diarrhea 03/18/2017   Gastroesophageal reflux disease    Normocytic anemia 12/05/2016   Alcohol use disorder, severe, dependence ([place]) 07/25/2016   Cocaine use disorder, severe, dependence ([place]) 07/25/2016   Major depressive disorder, recurrent severe without psychotic features ([place]) 07/20/2016   Chronic pancreatitis ([place]) 05/18/2015   Essential hypertension 02/06/2014   Mood disorder ([place]) 02/06/2014   Pancreatic pseudocyst/cyst 11/25/2013   [name] Parkinson White pattern seen on electrocardiogram 10/03/2012   Tobacco abuse 03/23/2007   Home Medication(s) Prior to Admission medications   Medication Sig Start Date End Date Taking? Authorizing Provider  acetaminophen (TYLENOL) 500 MG tablet Take 1,000 mg by mouth every 6 (six) hours as needed for moderate pain or headache.    [provider]  acetaminophen (TYLENOL) 650 MG CR tablet Take 1 tablet (650 mg total) by mouth  every 8 (eight) hours as needed for pain. 03/05/22   Nyoka Lint, PA-C  albuterol (VENTOLIN HFA) 108 (90 Base) MCG/ACT inhaler Inhale 2 puffs into the lungs every 4 (four) hours as needed for wheezing or shortness of breath. 08/17/21   [name], MD  amLODipine (NORVASC) 10 MG tablet Take 1 tablet (10 mg total) by mouth daily. 12/25/21   [name], MD  benzonatate (TESSALON) 100 MG capsule Take 1 capsule (100 mg total) by mouth 3 (three) times daily as needed for cough. 03/14/22   Long, Wonda Olds, MD  dextromethorphan-guaiFENesin [facility] DM) 30-600 MG 12hr tablet Take 1 tablet by mouth 2 (two) times daily. 03/05/22   Nyoka Lint, PA-C  dicyclomine (BENTYL) 10 MG capsule Take 1 capsule (10 mg total) by mouth 2 (two) times daily. 03/15/22   [name], [name], MD  DULoxetine (CYMBALTA) 30 MG capsule Take 2 capsules (60 mg total) by mouth at bedtime. Take 1 capsule daily at bedtime for 2 weeks and then increase to 2 capsules at bedtime 04/06/22   [name], [name], MD  folic acid (FOLVITE) 1 MG tablet Take 1 tablet (1 mg total) by mouth daily. 11/26/17   [name], MD  HYDROcodone-acetaminophen (NORCO/VICODIN) 5-325 MG tablet Take 1 tablet by mouth every 12 (twelve) hours as needed for moderate pain. 12/24/21   [name], MD  lidocaine (LMX) 4 % cream Apply topically 4 (four) times daily as needed (NGT pain). 12/24/21   [name], MD  lidocaine (XYLOCAINE) 2 % solution Use as directed 15 mLs in the mouth or throat every 6 (six) hours as needed for mouth pain. 07/08/22   Ripley Fraise, MD  lipase/protease/amylase (CREON) 36000 UNITS CPEP capsule Take 2 capsules (72,000 Units total) by mouth 3 (three) times daily with meals. May also take 1 capsule (36,000 Units total) as needed (with snacks). 07/08/22   Ripley Fraise, MD  loperamide (IMODIUM) 2 MG capsule Take 1 capsule (2 mg total) by mouth 4 (four) times daily as needed for diarrhea or loose stools. 02/17/22   Tegeler, Gwenyth Allegra, MD  magnesium oxide (MAG-OX) 400 (240 Mg) MG tablet Take 1 tablet (400 mg total) by mouth daily. 11/30/21   Davonna Belling, MD  metoprolol succinate (TOPROL XL) 25 MG 24 hr tablet Take 1 tablet (25 mg total) by mouth as needed (for palpitations). To not exceed 1 dose per day. Patient taking differently: Take 50 mg by mouth daily. 01/06/22    Duke, Tami Lin, PA  metoprolol succinate (TOPROL-XL) 200 MG 24 hr tablet Take 1 tablet (200 mg total) by mouth daily. Take with or immediately following a meal. 02/14/22   Minus Breeding, MD  Multiple Vitamin (MULTIVITAMIN WITH MINERALS) TABS tablet Take 1 tablet by mouth daily. 09/06/17   Bonnell Public, MD  nicotine (NICODERM CQ - DOSED IN MG/24 HOURS) 21 mg/24hr patch Place 1 patch (21 mg total) onto the skin at bedtime. 12/24/21   [name], MD  nicotine polacrilex (NICORETTE STARTER KIT) 4 MG gum Take 1 each (4 mg total) by mouth as needed for smoking cessation. 03/15/22   [name], [name], MD  nitroGLYCERIN (NITROSTAT) 0.4 MG SL tablet Place 1 tablet (0.4 mg total) under the tongue every 5 (five) minutes as needed for chest pain. Do not exceed 3 doses in a row. 01/06/22   Duke, Tami Lin, PA  ondansetron (ZOFRAN) 4 MG tablet Take 1 tablet (4 mg total) by mouth every 8 (  eight) hours as needed for nausea or vomiting. 05/18/22   [name], [name], MD  oxyCODONE (ROXICODONE) 5 MG immediate release tablet Take 1 tablet (5 mg total) by mouth every 4 (four) hours as needed for severe pain. 02/17/22   Tegeler, Gwenyth Allegra, MD  oxyCODONE-acetaminophen (PERCOCET/ROXICET) 5-325 MG tablet Take 1 tablet by mouth every 6 (six) hours as needed for severe pain. 03/14/22   Long, Wonda Olds, MD  pantoprazole (PROTONIX) 40 MG tablet Take 1 tablet (40 mg total) by mouth 2 (two) times daily. 06/27/22   Tedd Sias, PA  Polyethyl Glycol-Propyl Glycol (SYSTANE OP) Place 1 drop into both eyes 4 (four) times daily as needed (for dryness).    [provider]  sucralfate (CARAFATE) 1 GM/10ML suspension Take 10 mLs (1 g total) by mouth 4 (four) times daily -  with meals and at bedtime. 07/08/22   Ripley Fraise, MD  thiamine 100 MG tablet Take 1 tablet (100 mg total) by mouth daily. 10/16/18   [name], MD  Vitamin D, Ergocalciferol, (DRISDOL) 1.25 MG (50000 UNIT) CAPS capsule Take  50,000 Units by mouth every Wednesday.    [provider]  amitriptyline (ELAVIL) 25 MG tablet Take 1 tablet (25 mg total) by mouth at bedtime. Patient not taking: Reported on 08/08/2019 10/15/18 08/08/19  [name], MD                                                                                                                                    Past Surgical History Past Surgical History:  Procedure Laterality Date   BIOPSY  11/25/2017   Procedure: BIOPSY;  Surgeon: Arta Silence, MD;  Location: [facility] ENDOSCOPY;  Service: Endoscopy;;   BIOPSY  10/14/2018   Procedure: BIOPSY;  Surgeon: Arta Silence, MD;  Location: [facility];  Service: Endoscopy;;   CARDIAC CATHETERIZATION     CYST ENTEROSTOMY  01/02/2020   Procedure: CYST ASPIRATION;  Surgeon: Milus Banister, MD;  Location: [facility] ENDOSCOPY;  Service: Endoscopy;;   ESOPHAGOGASTRODUODENOSCOPY (EGD) WITH PROPOFOL N/A 11/25/2017   Procedure: ESOPHAGOGASTRODUODENOSCOPY (EGD) WITH PROPOFOL;  Surgeon: Arta Silence, MD;  Location: [facility];  Service: Endoscopy;  Laterality: N/A;   ESOPHAGOGASTRODUODENOSCOPY (EGD) WITH PROPOFOL Left 10/14/2018   Procedure: ESOPHAGOGASTRODUODENOSCOPY (EGD) WITH PROPOFOL;  Surgeon: Arta Silence, MD;  Location: [facility] ENDOSCOPY;  Service: Endoscopy;  Laterality: Left;   ESOPHAGOGASTRODUODENOSCOPY (EGD) WITH PROPOFOL N/A 11/14/2018   Procedure: ESOPHAGOGASTRODUODENOSCOPY (EGD) WITH PROPOFOL;  Surgeon: Laurence Spates, MD;  Location: [facility] ENDOSCOPY;  Service: Gastroenterology;  Laterality: N/A;   ESOPHAGOGASTRODUODENOSCOPY (EGD) WITH PROPOFOL N/A 01/02/2020   Procedure: ESOPHAGOGASTRODUODENOSCOPY (EGD) WITH PROPOFOL;  Surgeon: Milus Banister, MD;  Location: [facility] ENDOSCOPY;  Service: Endoscopy;  Laterality: N/A;   ESOPHAGOGASTRODUODENOSCOPY (EGD) WITH PROPOFOL N/A 10/25/2020   Procedure: ESOPHAGOGASTRODUODENOSCOPY (EGD) WITH PROPOFOL;  Surgeon: Rush Landmark Telford Nab., MD;  Location: [place];  Service:  Gastroenterology;  Laterality: N/A;   EUS  N/A 01/02/2020   Procedure: UPPER ENDOSCOPIC ULTRASOUND (EUS) RADIAL;  Surgeon: Milus Banister, MD;  Location: [facility] ENDOSCOPY;  Service: Endoscopy;  Laterality: N/A;   EYE SURGERY Left 1990's   "result of trauma"    FACIAL FRACTURE SURGERY Left 1990's   "result of trauma"    FLEXIBLE SIGMOIDOSCOPY N/A 10/25/2020   Procedure: FLEXIBLE SIGMOIDOSCOPY;  Surgeon: Irving Copas., MD;  Location: [place];  Service: Gastroenterology;  Laterality: N/A;   FRACTURE SURGERY     HEMOSTASIS CLIP PLACEMENT  10/25/2020   Procedure: HEMOSTASIS CLIP PLACEMENT;  Surgeon: Irving Copas., MD;  Location: [place];  Service: Gastroenterology;;   HERNIA REPAIR     LEFT HEART CATHETERIZATION WITH CORONARY ANGIOGRAM Right 03/07/2013   Procedure: LEFT HEART CATHETERIZATION WITH CORONARY ANGIOGRAM;  Surgeon: [name], MD;  Location: [facility] CATH LAB;  Service: Cardiovascular;  Laterality: Right;   UMBILICAL HERNIA REPAIR     UPPER GASTROINTESTINAL ENDOSCOPY     Family History Family History  Problem Relation Age of Onset   Hypertension Mother    Cirrhosis Father    Alcoholism Father    Hypertension Father    Melanoma Father    Hypertension Other    Coronary artery disease Other     Social History Social History   Tobacco Use   Smoking status: Every Day    Packs/day: 1.00    Years: 36.00    Total pack years: 36.00    Types: Cigarettes, E-cigarettes   Smokeless tobacco: Never  Vaping Use   Vaping Use: Former  Substance Use Topics   Alcohol use: Not Currently    Comment: last drink a few days ago   Drug use: Not Currently    Types: Marijuana, Cocaine   Allergies Robaxin [methocarbamol], Aspirin, Shellfish-derived products, Trazodone and nefazodone, Adhesive [tape], Latex, Toradol [ketorolac tromethamine], Contrast media [iodinated contrast media], Reglan [metoclopramide], and Salmon [fish oil]  Review of Systems Review of Systems   Constitutional:  Negative for chills and fever.  HENT:  Negative for facial swelling and trouble swallowing.   Eyes:  Negative for photophobia and visual disturbance.  Respiratory:  Negative for cough and shortness of breath.   Cardiovascular:  Negative for chest pain and palpitations.  Gastrointestinal:  Positive for abdominal pain. Negative for nausea and vomiting.  Endocrine: Negative for polydipsia and polyuria.  Genitourinary:  Negative for difficulty urinating and hematuria.  Musculoskeletal:  Negative for gait problem and joint swelling.  Skin:  Negative for pallor and rash.  Neurological:  Negative for syncope and headaches.  Psychiatric/Behavioral:  Negative for agitation and confusion.     Physical Exam Vital Signs  I have reviewed the triage vital signs BP (!) 154/100   Pulse (!) 129   Temp 97.9 F (36.6 C) (Oral)   Resp 17   SpO2 99%  Physical Exam Vitals and nursing note reviewed.  Constitutional:      General: He is not in acute distress.    Appearance: Normal appearance. He is well-developed. He is not ill-appearing or diaphoretic.  HENT:     Head: Normocephalic and atraumatic. No raccoon eyes, Battle's sign, right periorbital erythema or left periorbital erythema.     Jaw: There is normal jaw occlusion. No trismus.     Right Ear: External ear normal.     Left Ear: External ear normal.     Mouth/Throat:     Mouth: Mucous membranes are moist.  Eyes:     General: No scleral icterus. Cardiovascular:  Rate and Rhythm: Normal rate and regular rhythm.     Pulses: Normal pulses.     Heart sounds: Normal heart sounds.  Pulmonary:     Effort: Pulmonary effort is normal. No respiratory distress.     Breath sounds: Normal breath sounds.  Abdominal:     General: Abdomen is flat.     Palpations: Abdomen is soft.     Tenderness: There is no abdominal tenderness.  Musculoskeletal:        General: Normal range of motion.     Cervical back: Full passive range of  motion without pain and normal range of motion.     Right lower leg: No edema.     Left lower leg: No edema.  Skin:    General: Skin is warm and dry.     Capillary Refill: Capillary refill takes less than 2 seconds.  Neurological:     Mental Status: He is alert and oriented to person, place, and time.     GCS: GCS eye subscore is 4. GCS verbal subscore is 4. GCS motor subscore is 6.     Cranial Nerves: Cranial nerves 2-12 are intact.     Sensory: Sensation is intact.     Motor: Motor function is intact.     Coordination: Coordination is intact.     Comments: Post ictal Gait not tested 2/2 pt safety   Psychiatric:        Mood and Affect: Mood normal.        Behavior: Behavior normal.     ED Results and Treatments Labs (all labs ordered are listed, but only abnormal results are displayed) Labs Reviewed  CBC WITH DIFFERENTIAL/PLATELET - Abnormal; Notable for the following components:      Result Value   RBC 3.40 (*)    Hemoglobin 10.7 (*)    HCT 33.0 (*)    RDW 16.6 (*)    Platelets 132 (*)    Abs Immature Granulocytes 0.12 (*)    All other components within normal limits  COMPREHENSIVE METABOLIC PANEL - Abnormal; Notable for the following components:   CO2 10 (*)    Glucose, Bld 172 (*)    BUN <5 (*)    Creatinine, Ser 1.27 (*)    Calcium 5.9 (*)    AST 175 (*)    ALT 56 (*)    Anion gap 23 (*)    All other components within normal limits  CK - Abnormal; Notable for the following components:   Total CK 634 (*)    All other components within normal limits  T4, FREE - Abnormal; Notable for the following components:   Free T4 0.59 (*)    All other components within normal limits  TROPONIN I (HIGH SENSITIVITY) - Abnormal; Notable for the following components:   Troponin I (High Sensitivity) 20 (*)    All other components within normal limits  RESP PANEL BY RT-PCR (RSV, FLU A&B, COVID)  RVPGX2  LIPASE, BLOOD  TSH  ETHANOL  URINALYSIS, ROUTINE W REFLEX MICROSCOPIC   RAPID URINE DRUG SCREEN, HOSP PERFORMED  T3, FREE  TROPONIN I (HIGH SENSITIVITY)  Radiology CT Head Wo Contrast  Result Date: 07/19/2022 CLINICAL DATA:  Seizure, head trauma EXAM: CT HEAD WITHOUT CONTRAST TECHNIQUE: Contiguous axial images were obtained from the base of the skull through the vertex without intravenous contrast. RADIATION DOSE REDUCTION: This exam was performed according to the departmental dose-optimization program which includes automated exposure control, adjustment of the mA and/or kV according to patient size and/or use of iterative reconstruction technique. COMPARISON:  05/12/2021 FINDINGS: Brain: No acute infarct or hemorrhage. Lateral ventricles and midline structures are unremarkable. No acute extra-axial fluid collections. No mass effect. Vascular: No hyperdense vessel or unexpected calcification. Skull: Normal. Negative for fracture or focal lesion. Sinuses/Orbits: Chronic posttraumatic and postsurgical changes of the inferior and medial left orbital wall unchanged. Paranasal sinuses are clear. Other: None. IMPRESSION: 1. Stable head CT, no acute process. Electronically Signed   By: Randa Ngo M.D.   On: 07/19/2022 22:37   CT ABDOMEN PELVIS WO CONTRAST  Result Date: 07/19/2022 CLINICAL DATA:  Epigastric pain. Patient arrives to ED for seizure activity. Patient postictal and altered on scene per EMS. EXAM: CT ABDOMEN AND PELVIS WITHOUT CONTRAST TECHNIQUE: Multidetector CT imaging of the abdomen and pelvis was performed following the standard protocol without IV contrast. RADIATION DOSE REDUCTION: This exam was performed according to the departmental dose-optimization program which includes automated exposure control, adjustment of the mA and/or kV according to patient size and/or use of iterative reconstruction technique. COMPARISON:  CT 05/28/2022  FINDINGS: Lower chest: No acute abnormality. Hepatobiliary: Unremarkable liver, gallbladder, and biliary tree. Pancreas: Coarse calcifications in the pancreatic head and body compatible with sequela of chronic pancreatitis. Low-density fluid collection in the pancreatic tail measuring 2.2 cm is likely a pseudocyst. Additional pseudocyst in the distal pancreatic tail measuring 1.1 cm. Spleen: Unremarkable. Adrenals/Urinary Tract: Normal adrenal glands. No urinary calculi or hydronephrosis. Unremarkable bladder. Stomach/Bowel: Normal caliber large and small bowel. No bowel wall thickening. Unremarkable stomach. Normal appendix. Vascular/Lymphatic: Aortic atherosclerosis. No enlarged abdominal or pelvic lymph nodes. Reproductive: Unremarkable. Other: No free intraperitoneal air.  No abdominal wall hernia. Musculoskeletal: No acute or significant osseous findings. IMPRESSION: 1. No acute abnormality identified in the abdomen or pelvis. 2. 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 Aortic Atherosclerosis (ICD10-I70.0). Electronically Signed   By: Placido Sou M.D.   On: 07/19/2022 22:07   DG Chest 1 View  Result Date: 07/19/2022 CLINICAL DATA:  Seizures, Tachy EXAM: CHEST  1 VIEW COMPARISON:  07/08/2022 FINDINGS: Lungs are clear. Heart size and mediastinal contours are within normal limits. No effusion. Visualized bones unremarkable. IMPRESSION: No acute cardiopulmonary disease. Electronically Signed   By: Lucrezia Europe M.D.   On: 07/19/2022 20:14    Pertinent labs & imaging results that were available during my care of the patient were reviewed by me and considered in my medical decision making (see MDM for details).  Medications Ordered in ED Medications  calcium gluconate 1 g/ 50 mL sodium chloride IVPB (1,000 mg Intravenous New Bag/Given 07/19/22 2310)  LORazepam (ATIVAN) tablet 1-4 mg (has no administration in time range)  thiamine (VITAMIN B1) tablet 100 mg (has  no administration in time range)    Or  thiamine (VITAMIN B1) injection 100 mg (has no administration in time range)  folic acid (FOLVITE) tablet 1 mg (has no administration in time range)  multivitamin with minerals tablet 1 tablet (has no administration in time range)  sodium chloride 0.9 % bolus 1,000 mL (1,000 mLs Intravenous New Bag/Given 07/19/22 2011)  sodium chloride 0.9 % bolus 1,000 mL (1,000 mLs Intravenous New Bag/Given 07/19/22 2102)  famotidine (PEPCID) IVPB 20 mg premix (0 mg Intravenous Stopped 07/19/22 2310)  ketorolac (TORADOL) 30 MG/ML injection 30 mg (30 mg Intravenous Given 07/19/22 2131)  amLODipine (NORVASC) tablet 10 mg (10 mg Oral Given 07/19/22 2130)  LORazepam (ATIVAN) tablet 2 mg (2 mg Oral Given 07/19/22 2130)  metoprolol tartrate (LOPRESSOR) injection 5 mg (5 mg Intravenous Given 07/20/22 0003)                                                                                                                                     Procedures .Critical Care  Performed by: Jeanell Sparrow, DO Authorized by: Jeanell Sparrow, DO   Critical care provider statement:    Critical care time (minutes):  30   Critical care time was exclusive of:  Separately billable procedures and treating other patients   Critical care was necessary to treat or prevent imminent or life-threatening deterioration of the following conditions:  Metabolic crisis   Critical care was time spent personally by me on the following activities:  Development of treatment plan with patient or surrogate, discussions with consultants, evaluation of patient's response to treatment, examination of patient, ordering and review of laboratory studies, ordering and review of radiographic studies, ordering and performing treatments and interventions, pulse oximetry, re-evaluation of patient's condition, review of old charts and obtaining history from patient or surrogate   (including critical care time)  Medical Decision  Making / ED Course    Medical Decision Making:    [name] is a 52 y.o. male with past medical history as below, significant for polysubstance use, chronic alcohol abuse, WPW, chronic pancreatitis, bipolar disorder, asthma, hypertension, verbal compliance medications who presents to the ED with complaint of ?seizure. . The complaint involves an extensive differential diagnosis and also carries with it a high risk of complications and morbidity.  Serious etiology was considered. Ddx includes but is not limited to: Differential diagnoses for altered mental status includes but is not exclusive to alcohol, illicit or prescription medications, intracranial pathology such as stroke, intracerebral hemorrhage, fever or infectious causes including sepsis, hypoxemia, uremia, trauma, endocrine related disorders such as diabetes, hypoglycemia, thyroid-related diseases, etc.    Complete initial physical exam performed, notably the patient  was postictal, tachycardic.    Reviewed and confirmed nursing documentation for past medical history, family history, social history.  Vital signs reviewed.   On recheck he is feeling better, mental status improved, - does report that he has not taken his medications over the past 2 days, used cocaine 1 to 2 days ago.  Last alcohol was approximate 1 week ago.    Clinical Course as of 07/20/22 0004  Tue Jul 19, 2022  2052 Echo reviewed, will give 2nd L IVF [SG]  2122 Pt reports he has not taken his blood pressure medications in the  past 2 days, he also used cocaine 1 day ago.  Prior seizure approximately year and a half ago assist with alcohol use.  Not take AEDs.  No alcohol in approximately 1 to 1.5 weeks. [SG]    Clinical Course User Index [SG] Jeanell Sparrow, DO   Patient with tachycardia, hypertension, has not taken his blood pressure medications including beta-blocker in the past 2 days.  Will give Norvasc and metoprolol.  Last cocaine use was 2 days  ago.  Will give IV fluids and reassess.  Patient was also given benzo given can use, chronic alcohol abuse.  CIWA protocol ordered.  Labs reviewed, troponin is mildly elevated at 20, no chest pain.  Favor demand ischemia in setting of tachycardia  CPK is elevated 634, patient with reported seizure-like activity prior to arrival.  Calcium low, replaced intravenously.  Bicarb is also low.  Similar to prior reading.  Hemoglobin similar to prior.  No seizure while in the ED; seizure precautions in place. Pt reports prior seizure around 1.5 yrs ago believed to be a/w etoh abuse.  Patient with persistent tachycardia, unclear etiology, possibly secondary to alcohol withdrawal, missed med dose, cocaine abuse  Patient signed out to incoming EDP pending reassessment medication, patient may require admission given persistent tachycardia, some possible alcohol withdrawal versus other sources as above   Additional history obtained: -Additional history obtained from ems -External records from outside source obtained and reviewed including: Chart review including previous notes, labs, imaging, consultation notes including prior ED visits, prior labs and imaging, home medications   Lab Tests: -I ordered, reviewed, and interpreted labs.   The pertinent results include:   Labs Reviewed  CBC WITH DIFFERENTIAL/PLATELET - Abnormal; Notable for the following components:      Result Value   RBC 3.40 (*)    Hemoglobin 10.7 (*)    HCT 33.0 (*)    RDW 16.6 (*)    Platelets 132 (*)    Abs Immature Granulocytes 0.12 (*)    All other components within normal limits  COMPREHENSIVE METABOLIC PANEL - Abnormal; Notable for the following components:   CO2 10 (*)    Glucose, Bld 172 (*)    BUN <5 (*)    Creatinine, Ser 1.27 (*)    Calcium 5.9 (*)    AST 175 (*)    ALT 56 (*)    Anion gap 23 (*)    All other components within normal limits  CK - Abnormal; Notable for the following components:   Total CK 634  (*)    All other components within normal limits  T4, FREE - Abnormal; Notable for the following components:   Free T4 0.59 (*)    All other components within normal limits  TROPONIN I (HIGH SENSITIVITY) - Abnormal; Notable for the following components:   Troponin I (High Sensitivity) 20 (*)    All other components within normal limits  RESP PANEL BY RT-PCR (RSV, FLU A&B, COVID)  RVPGX2  LIPASE, BLOOD  TSH  ETHANOL  URINALYSIS, ROUTINE W REFLEX MICROSCOPIC  RAPID URINE DRUG SCREEN, HOSP PERFORMED  T3, FREE  TROPONIN I (HIGH SENSITIVITY)    Notable for anemia, mild elevated troponin, favor demand ischemia in setting of tachycardia, low bicarb  EKG   EKG Interpretation  Date/Time:  Tuesday July 19 2022 19:46:59 EDT Ventricular Rate:  157 PR Interval:  68 QRS Duration: 78 QT Interval:  281 QTC Calculation: 469 R Axis:   79 Text Interpretation: Sinus tachycardia Paired ventricular  premature complexes Abnormal R-wave progression, early transition Repolarization abnormality, prob rate related Confirmed by Ripley Fraise [phone]) on 07/19/2022 11:52:39 PM         Imaging Studies ordered: I ordered imaging studies including CT head, CT AP I independently visualized the following imaging with scope of interpretation limited to determining acute life threatening conditions related to emergency care: chronic changes  I independently visualized and interpreted imaging. I agree with the radiologist interpretation   Medicines ordered and prescription drug management: Meds ordered this encounter  Medications   sodium chloride 0.9 % bolus 1,000 mL   sodium chloride 0.9 % bolus 1,000 mL   famotidine (PEPCID) IVPB 20 mg premix   ketorolac (TORADOL) 30 MG/ML injection 30 mg   amLODipine (NORVASC) tablet 10 mg   LORazepam (ATIVAN) tablet 2 mg   calcium gluconate 1 g/ 50 mL sodium chloride IVPB   metoprolol tartrate (LOPRESSOR) injection 5 mg   LORazepam (ATIVAN) tablet 1-4 mg     Order Specific Question:   CIWA-AR < 5 =    Answer:   0 mg    Order Specific Question:   CIWA-AR 5 -10 =    Answer:   1 mg    Order Specific Question:   CIWA-AR 11 -15 =    Answer:   2 mg    Order Specific Question:   CIWA-AR 16 -20 =    Answer:   3 mg    Order Specific Question:   CIWA-AR 16 -20 =    Answer:   Recheck CIWA-AR in 1 hour; if > 20 notify MD    Order Specific Question:   CIWA-AR > 20 =    Answer:   4 mg    Order Specific Question:   CIWA-AR > 20 =    Answer:   Call Rapid Response   OR Linked Order Group    thiamine (VITAMIN B1) tablet 100 mg    thiamine (VITAMIN B1) injection 123XX123 mg   folic acid (FOLVITE) tablet 1 mg   multivitamin with minerals tablet 1 tablet    -I have reviewed the patients home medicines and have made adjustments as needed   Consultations Obtained: na   Cardiac Monitoring: The patient was maintained on a cardiac monitor.  I personally viewed and interpreted the cardiac monitored which showed an underlying rhythm of: sinus tachy  Social Determinants of Health:  Diagnosis or treatment significantly limited by social determinants of health: current smoker, alcohol use, and polysubstance abuse Counseled patient for approximately 3 minutes regarding smoking cessation. Discussed risks of smoking and how they applied and affected their visit here today. Patient not ready to quit at this time, however will follow up with their primary doctor when they are.   CPT code: [phone]: intermediate counseling for smoking cessation    Reevaluation: After the interventions noted above, I reevaluated the patient and found that they have stayed the same  Co morbidities that complicate the patient evaluation  Past Medical History:  Diagnosis Date   Alcoholism /alcohol abuse    Anemia    Anxiety    Arthritis    knees; arms; elbows (03/26/2015)   Asthma    Bipolar disorder ([place])    Chronic bronchitis ([place])    Chronic lower back pain    Chronic  pancreatitis ([place])    Cocaine abuse ([place])    Depression    Family history of adverse reaction to anesthesia    Femoral condyle fracture ([place]) 03/08/2014  left medial/notes 03/09/2014   GERD (gastroesophageal reflux disease)    H/O hiatal hernia    H/O suicide attempt 10/2012   High cholesterol    History of blood transfusion 10/2012   when I tried to commit suicide   History of stomach ulcers    Hypertension    Marijuana abuse, continuous    Migraine    a few times/year (03/26/2015)   Pneumonia 1990's X 3   PTSD (post-traumatic stress disorder)    Sickle cell trait ([facility])    WPW (Wolff-Parkinson-White syndrome)    [name] 03/06/2013      Dispostion: Disposition decision including need for hospitalization was considered, and patient disposition pending at time of sign out.    Final Clinical Impression(s) / ED Diagnoses Final diagnoses:  Tachycardia  Seizure-like activity ([place])  Polysubstance abuse ([place])  Alcohol abuse     This chart was dictated using voice recognition software.  Despite best efforts to proofread,  errors can occur which can change the documentation meaning.\   Jeanell Sparrow, DO 07/20/22 0005    Jeanell Sparrow, DO 07/20/22 Ileene Musa

## 2022-07-19 NOTE — ED Notes (Signed)
Pt in CT.

## 2022-07-19 NOTE — ED Provider Notes (Incomplete)
[name] Provider Note  CSN: [id number] Arrival date & time: 07/19/22 1936  Chief Complaint(s) Seizures  HPI [name] is a 52 y.o. male with past medical history as below, significant for polysubstance use, chronic alcohol abuse, WPW, chronic pancreatitis, bipolar disorder, asthma, hypertension, verbal compliance medications who presents to the ED with complaint of ?seizure.  Patient with seizure activity reported by bystander/friend.  On eMS arrival he was reportedly postictal, he was given Versed secondary to agitation.  On patient arrival he appears postictal, unclear of events leading up to arrival to ED today.  Unable to contribute much to medical history, does report some ongoing abdominal pain which does appear to be chronic   Level 5 caveat ams  Past Medical History Past Medical History:  Diagnosis Date  . Alcoholism /alcohol abuse   . Anemia   . Anxiety   . Arthritis    knees; arms; elbows (03/26/2015)  . Asthma   . Bipolar disorder ([place])   . Chronic bronchitis ([place])   . Chronic lower back pain   . Chronic pancreatitis ([place])   . Cocaine abuse ([place])   . Depression   . Family history of adverse reaction to anesthesia   . Femoral condyle fracture ([place]) 03/08/2014   left medial/notes 03/09/2014  . GERD (gastroesophageal reflux disease)   . H/O hiatal hernia   . H/O suicide attempt 10/2012  . High cholesterol   . History of blood transfusion 10/2012   when I tried to commit suicide  . History of stomach ulcers   . Hypertension   . Marijuana abuse, continuous   . Migraine    a few times/year (03/26/2015)  . Pneumonia 1990's X 3  . PTSD (post-traumatic stress disorder)   . Sickle cell trait ([place])   . WPW (Wolff-Parkinson-White syndrome)    [name] 03/06/2013   Patient Active Problem List   Diagnosis Date Noted  . NSVT (nonsustained ventricular tachycardia) ([place]) 01/20/2022  . Normal anion gap metabolic  acidosis [id number]  . Hypoalbuminemia 12/08/2021  . Acute recurrent pancreatitis 12/07/2021  . WPW (Wolff-Parkinson-White syndrome) 12/06/2021  . Paroxysmal atrial fibrillation with RVR ([place]) 12/06/2021  . Pulmonary nodule 12/06/2021  . Leukocytosis 06/07/2021  . Epigastric pain 06/07/2021  . Acute alcoholic pancreatitis 0000000  . Urinary retention   . Protein-calorie malnutrition, severe 11/17/2020  . Acute pancreatitis 11/15/2020  . Hypomagnesemia   . Hypocalcemia 10/23/2020  . AKI (acute kidney injury) ([place]) 11/13/2018  . Seizure ([place]) 11/13/2018  . Chest pain 01/08/2018  . Acute on chronic pancreatitis ([place]) 09/28/2017  . Abdominal pain 05/27/2017  . Nausea & vomiting 03/18/2017  . Diarrhea 03/18/2017  . Gastroesophageal reflux disease   . Normocytic anemia 12/05/2016  . Alcohol use disorder, severe, dependence ([place]) 07/25/2016  . Cocaine use disorder, severe, dependence ([place]) 07/25/2016  . Major depressive disorder, recurrent severe without psychotic features ([place]) 07/20/2016  . Chronic pancreatitis ([place]) 05/18/2015  . Essential hypertension 02/06/2014  . Mood disorder ([place]) 02/06/2014  . Pancreatic pseudocyst/cyst 11/25/2013  . 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 Tobacco abuse 03/23/2007   Home Medication(s) Prior to Admission medications   Medication Sig Start Date End Date Taking? Authorizing Provider  acetaminophen (TYLENOL) 500 MG tablet Take 1,000 mg by mouth every 6 (six) hours as needed for moderate pain or headache.    [provider]  acetaminophen (TYLENOL) 650 MG CR tablet Take 1 tablet (650 mg total) by mouth  every 8 (eight) hours as needed for pain. 03/05/22   Nyoka Lint, PA-C  albuterol (VENTOLIN HFA) 108 (90 Base) MCG/ACT inhaler Inhale 2 puffs into the lungs every 4 (four) hours as needed for wheezing or shortness of breath. 08/17/21   [name], MD  amLODipine (NORVASC) 10 MG tablet Take 1 tablet  (10 mg total) by mouth daily. 12/25/21   [name], MD  benzonatate (TESSALON) 100 MG capsule Take 1 capsule (100 mg total) by mouth 3 (three) times daily as needed for cough. 03/14/22   Long, Wonda Olds, MD  dextromethorphan-guaiFENesin [facility] DM) 30-600 MG 12hr tablet Take 1 tablet by mouth 2 (two) times daily. 03/05/22   Nyoka Lint, PA-C  dicyclomine (BENTYL) 10 MG capsule Take 1 capsule (10 mg total) by mouth 2 (two) times daily. 03/15/22   [name], [name], MD  DULoxetine (CYMBALTA) 30 MG capsule Take 2 capsules (60 mg total) by mouth at bedtime. Take 1 capsule daily at bedtime for 2 weeks and then increase to 2 capsules at bedtime 04/06/22   [name], [name], MD  folic acid (FOLVITE) 1 MG tablet Take 1 tablet (1 mg total) by mouth daily. 11/26/17   [name], MD  HYDROcodone-acetaminophen (NORCO/VICODIN) 5-325 MG tablet Take 1 tablet by mouth every 12 (twelve) hours as needed for moderate pain. 12/24/21   [name], MD  lidocaine (LMX) 4 % cream Apply topically 4 (four) times daily as needed (NGT pain). 12/24/21   [name], MD  lidocaine (XYLOCAINE) 2 % solution Use as directed 15 mLs in the mouth or throat every 6 (six) hours as needed for mouth pain. 07/08/22   Ripley Fraise, MD  lipase/protease/amylase (CREON) 36000 UNITS CPEP capsule Take 2 capsules (72,000 Units total) by mouth 3 (three) times daily with meals. May also take 1 capsule (36,000 Units total) as needed (with snacks). 07/08/22   Ripley Fraise, MD  loperamide (IMODIUM) 2 MG capsule Take 1 capsule (2 mg total) by mouth 4 (four) times daily as needed for diarrhea or loose stools. 02/17/22   Tegeler, Gwenyth Allegra, MD  magnesium oxide (MAG-OX) 400 (240 Mg) MG tablet Take 1 tablet (400 mg total) by mouth daily. 11/30/21   Davonna Belling, MD  metoprolol succinate (TOPROL XL) 25 MG 24 hr tablet Take 1 tablet (25 mg total) by mouth as needed (for palpitations). To not exceed 1 dose per day. Patient  taking differently: Take 50 mg by mouth daily. 01/06/22   Duke, Tami Lin, PA  metoprolol succinate (TOPROL-XL) 200 MG 24 hr tablet Take 1 tablet (200 mg total) by mouth daily. Take with or immediately following a meal. 02/14/22   Minus Breeding, MD  Multiple Vitamin (MULTIVITAMIN WITH MINERALS) TABS tablet Take 1 tablet by mouth daily. 09/06/17   Bonnell Public, MD  nicotine (NICODERM CQ - DOSED IN MG/24 HOURS) 21 mg/24hr patch Place 1 patch (21 mg total) onto the skin at bedtime. 12/24/21   [name], MD  nicotine polacrilex (NICORETTE STARTER KIT) 4 MG gum Take 1 each (4 mg total) by mouth as needed for smoking cessation. 03/15/22   [name], [name], MD  nitroGLYCERIN (NITROSTAT) 0.4 MG SL tablet Place 1 tablet (0.4 mg total) under the tongue every 5 (five) minutes as needed for chest pain. Do not exceed 3 doses in a row. 01/06/22   Duke, Tami Lin, PA  ondansetron (ZOFRAN) 4 MG tablet Take 1 tablet (4 mg total) by mouth every 8 (  eight) hours as needed for nausea or vomiting. 05/18/22   [name], [name], MD  oxyCODONE (ROXICODONE) 5 MG immediate release tablet Take 1 tablet (5 mg total) by mouth every 4 (four) hours as needed for severe pain. 02/17/22   Tegeler, Gwenyth Allegra, MD  oxyCODONE-acetaminophen (PERCOCET/ROXICET) 5-325 MG tablet Take 1 tablet by mouth every 6 (six) hours as needed for severe pain. 03/14/22   Long, Wonda Olds, MD  pantoprazole (PROTONIX) 40 MG tablet Take 1 tablet (40 mg total) by mouth 2 (two) times daily. 06/27/22   Tedd Sias, PA  Polyethyl Glycol-Propyl Glycol (SYSTANE OP) Place 1 drop into both eyes 4 (four) times daily as needed (for dryness).    [provider]  sucralfate (CARAFATE) 1 GM/10ML suspension Take 10 mLs (1 g total) by mouth 4 (four) times daily -  with meals and at bedtime. 07/08/22   Ripley Fraise, MD  thiamine 100 MG tablet Take 1 tablet (100 mg total) by mouth daily. 10/16/18   [name], MD  Vitamin D,  Ergocalciferol, (DRISDOL) 1.25 MG (50000 UNIT) CAPS capsule Take 50,000 Units by mouth every Wednesday.    [provider]  amitriptyline (ELAVIL) 25 MG tablet Take 1 tablet (25 mg total) by mouth at bedtime. Patient not taking: Reported on 08/08/2019 10/15/18 08/08/19  [name], MD                                                                                                                                    Past Surgical History Past Surgical History:  Procedure Laterality Date  . BIOPSY  11/25/2017   Procedure: BIOPSY;  Surgeon: Arta Silence, MD;  Location[facility] ENDOSCOPY;  Service: Endoscopy;;  . BIOPSY  10/14/2018   Procedure: BIOPSY;  Surgeon: Arta Silence, MD;  Location: [place];  Service: Endoscopy;;  . CARDIAC CATHETERIZATION    . CYST ENTEROSTOMY  01/02/2020   Procedure: CYST ASPIRATION;  Surgeon: Milus Banister, MD;  Location: [facility] ENDOSCOPY;  Service: Endoscopy;;  . ESOPHAGOGASTRODUODENOSCOPY (EGD) WITH PROPOFOL N/A 11/25/2017   Procedure: ESOPHAGOGASTRODUODENOSCOPY (EGD) WITH PROPOFOL;  Surgeon: Arta Silence, MD;  Location: [place];  Service: Endoscopy;  Laterality: N/A;  . ESOPHAGOGASTRODUODENOSCOPY (EGD) WITH PROPOFOL Left 10/14/2018   Procedure: ESOPHAGOGASTRODUODENOSCOPY (EGD) WITH PROPOFOL;  Surgeon: Arta Silence, MD;  Location: [facility] ENDOSCOPY;  Service: Endoscopy;  Laterality: Left;  . ESOPHAGOGASTRODUODENOSCOPY (EGD) WITH PROPOFOL N/A 11/14/2018   Procedure: ESOPHAGOGASTRODUODENOSCOPY (EGD) WITH PROPOFOL;  Surgeon: Laurence Spates, MD;  Location: [facility] ENDOSCOPY;  Service: Gastroenterology;  Laterality: N/A;  . ESOPHAGOGASTRODUODENOSCOPY (EGD) WITH PROPOFOL N/A 01/02/2020   Procedure: ESOPHAGOGASTRODUODENOSCOPY (EGD) WITH PROPOFOL;  Surgeon: Milus Banister, MD;  Location: [facility] ENDOSCOPY;  Service: Endoscopy;  Laterality: N/A;  . ESOPHAGOGASTRODUODENOSCOPY (EGD) WITH PROPOFOL N/A 10/25/2020   Procedure: ESOPHAGOGASTRODUODENOSCOPY (EGD) WITH PROPOFOL;  Surgeon:  Rush Landmark Telford Nab., MD;  Location: [place];  Service: Gastroenterology;  Laterality: N/A;  . EUS  N/A 01/02/2020   Procedure: UPPER ENDOSCOPIC ULTRASOUND (EUS) RADIAL;  Surgeon: Milus Banister, MD;  Location: [facility] ENDOSCOPY;  Service: Endoscopy;  Laterality: N/A;  . EYE SURGERY Left 1990's   "result of trauma"   . FACIAL FRACTURE SURGERY Left 1990's   "result of trauma"   . FLEXIBLE SIGMOIDOSCOPY N/A 10/25/2020   Procedure: FLEXIBLE SIGMOIDOSCOPY;  Surgeon: Rush Landmark Telford Nab., MD;  Location: [place];  Service: Gastroenterology;  Laterality: N/A;  . FRACTURE SURGERY    . HEMOSTASIS CLIP PLACEMENT  10/25/2020   Procedure: HEMOSTASIS CLIP PLACEMENT;  Surgeon: Irving Copas., MD;  Location: [place];  Service: Gastroenterology;;  . HERNIA REPAIR    . LEFT HEART CATHETERIZATION WITH CORONARY ANGIOGRAM Right 03/07/2013   Procedure: LEFT HEART CATHETERIZATION WITH CORONARY ANGIOGRAM;  Surgeon: [name], MD;  Location: [place] CATH LAB;  Service: Cardiovascular;  Laterality: Right;  . UMBILICAL HERNIA REPAIR    . UPPER GASTROINTESTINAL ENDOSCOPY     Family History Family History  Problem Relation Age of Onset  . Hypertension Mother   . Cirrhosis Father   . Alcoholism Father   . Hypertension Father   . Melanoma Father   . Hypertension Other   . Coronary artery disease Other     Social History Social History   Tobacco Use  . Smoking status: Every Day    Packs/day: 1.00    Years: 36.00    Total pack years: 36.00    Types: Cigarettes, E-cigarettes  . Smokeless tobacco: Never  Vaping Use  . Vaping Use: Former  Substance Use Topics  . Alcohol use: Not Currently    Comment: last drink a few days ago  . Drug use: Not Currently    Types: Marijuana, Cocaine   Allergies Robaxin [methocarbamol], Aspirin, Shellfish-derived products, Trazodone and nefazodone, Adhesive [tape], Latex, Toradol [ketorolac tromethamine], Contrast media [iodinated contrast media],  Reglan [metoclopramide], and Salmon [fish oil]  Review of Systems Review of Systems  Constitutional:  Negative for chills and fever.  HENT:  Negative for facial swelling and trouble swallowing.   Eyes:  Negative for photophobia and visual disturbance.  Respiratory:  Negative for cough and shortness of breath.   Cardiovascular:  Negative for chest pain and palpitations.  Gastrointestinal:  Positive for abdominal pain. Negative for nausea and vomiting.  Endocrine: Negative for polydipsia and polyuria.  Genitourinary:  Negative for difficulty urinating and hematuria.  Musculoskeletal:  Negative for gait problem and joint swelling.  Skin:  Negative for pallor and rash.  Neurological:  Negative for syncope and headaches.  Psychiatric/Behavioral:  Negative for agitation and confusion.     Physical Exam Vital Signs  I have reviewed the triage vital signs BP (!) 151/99   Pulse (!) 126   Temp 97.9 F (36.6 C) (Oral)   Resp (!) 22   SpO2 100%  Physical Exam Vitals and nursing note reviewed.  Constitutional:      General: He is not in acute distress.    Appearance: Normal appearance. He is well-developed. He is not ill-appearing or diaphoretic.  HENT:     Head: Normocephalic and atraumatic. No raccoon eyes, Battle's sign, right periorbital erythema or left periorbital erythema.     Jaw: There is normal jaw occlusion. No trismus.     Right Ear: External ear normal.     Left Ear: External ear normal.     Mouth/Throat:     Mouth: Mucous membranes are moist.  Eyes:     General: No scleral icterus.  Cardiovascular:     Rate and Rhythm: Normal rate and regular rhythm.     Pulses: Normal pulses.     Heart sounds: Normal heart sounds.  Pulmonary:     Effort: Pulmonary effort is normal. No respiratory distress.     Breath sounds: Normal breath sounds.  Abdominal:     General: Abdomen is flat.     Palpations: Abdomen is soft.     Tenderness: There is no abdominal tenderness.   Musculoskeletal:        General: Normal range of motion.     Cervical back: Full passive range of motion without pain and normal range of motion.     Right lower leg: No edema.     Left lower leg: No edema.  Skin:    General: Skin is warm and dry.     Capillary Refill: Capillary refill takes less than 2 seconds.  Neurological:     Mental Status: He is alert and oriented to person, place, and time.     GCS: GCS eye subscore is 4. GCS verbal subscore is 4. GCS motor subscore is 6.     Cranial Nerves: Cranial nerves 2-12 are intact.     Sensory: Sensation is intact.     Motor: Motor function is intact.     Coordination: Coordination is intact.     Comments: Post ictal Gait not tested 2/2 pt safety   Psychiatric:        Mood and Affect: Mood normal.        Behavior: Behavior normal.     ED Results and Treatments Labs (all labs ordered are listed, but only abnormal results are displayed) Labs Reviewed  CBC WITH DIFFERENTIAL/PLATELET - Abnormal; Notable for the following components:      Result Value   RBC 3.40 (*)    Hemoglobin 10.7 (*)    HCT 33.0 (*)    RDW 16.6 (*)    Platelets 132 (*)    Abs Immature Granulocytes 0.12 (*)    All other components within normal limits  COMPREHENSIVE METABOLIC PANEL - Abnormal; Notable for the following components:   CO2 10 (*)    Glucose, Bld 172 (*)    BUN <5 (*)    Creatinine, Ser 1.27 (*)    Calcium 5.9 (*)    AST 175 (*)    ALT 56 (*)    Anion gap 23 (*)    All other components within normal limits  CK - Abnormal; Notable for the following components:   Total CK 634 (*)    All other components within normal limits  T4, FREE - Abnormal; Notable for the following components:   Free T4 0.59 (*)    All other components within normal limits  TROPONIN I (HIGH SENSITIVITY) - Abnormal; Notable for the following components:   Troponin I (High Sensitivity) 20 (*)    All other components within normal limits  RESP PANEL BY RT-PCR (RSV,  FLU A&B, COVID)  RVPGX2  LIPASE, BLOOD  TSH  ETHANOL  URINALYSIS, ROUTINE W REFLEX MICROSCOPIC  RAPID URINE DRUG SCREEN, HOSP PERFORMED  T3, FREE  TROPONIN I (HIGH SENSITIVITY)  Radiology CT Head Wo Contrast  Result Date: 07/19/2022 CLINICAL DATA:  Seizure, head trauma EXAM: CT HEAD WITHOUT CONTRAST TECHNIQUE: Contiguous axial images were obtained from the base of the skull through the vertex without intravenous contrast. RADIATION DOSE REDUCTION: This exam was performed according to the departmental dose-optimization program which includes automated exposure control, adjustment of the mA and/or kV according to patient size and/or use of iterative reconstruction technique. COMPARISON:  05/12/2021 FINDINGS: Brain: No acute infarct or hemorrhage. Lateral ventricles and midline structures are unremarkable. No acute extra-axial fluid collections. No mass effect. Vascular: No hyperdense vessel or unexpected calcification. Skull: Normal. Negative for fracture or focal lesion. Sinuses/Orbits: Chronic posttraumatic and postsurgical changes of the inferior and medial left orbital wall unchanged. Paranasal sinuses are clear. Other: None. IMPRESSION: 1. Stable head CT, no acute process. Electronically Signed   By: Randa Ngo M.D.   On: 07/19/2022 22:37   CT ABDOMEN PELVIS WO CONTRAST  Result Date: 07/19/2022 CLINICAL DATA:  Epigastric pain. Patient arrives to ED for seizure activity. Patient postictal and altered on scene per EMS. EXAM: CT ABDOMEN AND PELVIS WITHOUT CONTRAST TECHNIQUE: Multidetector CT imaging of the abdomen and pelvis was performed following the standard protocol without IV contrast. RADIATION DOSE REDUCTION: This exam was performed according to the departmental dose-optimization program which includes automated exposure control, adjustment of the mA and/or kV according  to patient size and/or use of iterative reconstruction technique. COMPARISON:  CT 05/28/2022 FINDINGS: Lower chest: No acute abnormality. Hepatobiliary: Unremarkable liver, gallbladder, and biliary tree. Pancreas: Coarse calcifications in the pancreatic head and body compatible with sequela of chronic pancreatitis. Low-density fluid collection in the pancreatic tail measuring 2.2 cm is likely a pseudocyst. Additional pseudocyst in the distal pancreatic tail measuring 1.1 cm. Spleen: Unremarkable. Adrenals/Urinary Tract: Normal adrenal glands. No urinary calculi or hydronephrosis. Unremarkable bladder. Stomach/Bowel: Normal caliber large and small bowel. No bowel wall thickening. Unremarkable stomach. Normal appendix. Vascular/Lymphatic: Aortic atherosclerosis. No enlarged abdominal or pelvic lymph nodes. Reproductive: Unremarkable. Other: No free intraperitoneal air.  No abdominal wall hernia. Musculoskeletal: No acute or significant osseous findings. IMPRESSION: 1. No acute abnormality identified in the abdomen or pelvis. 2. 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 Aortic Atherosclerosis (ICD10-I70.0). Electronically Signed   By: Placido Sou M.D.   On: 07/19/2022 22:07   DG Chest 1 View  Result Date: 07/19/2022 CLINICAL DATA:  Seizures, Tachy EXAM: CHEST  1 VIEW COMPARISON:  07/08/2022 FINDINGS: Lungs are clear. Heart size and mediastinal contours are within normal limits. No effusion. Visualized bones unremarkable. IMPRESSION: No acute cardiopulmonary disease. Electronically Signed   By: Lucrezia Europe M.D.   On: 07/19/2022 20:14    Pertinent labs & imaging results that were available during my care of the patient were reviewed by me and considered in my medical decision making (see MDM for details).  Medications Ordered in ED Medications  calcium gluconate 1 g/ 50 mL sodium chloride IVPB (1,000 mg Intravenous New Bag/Given 07/19/22 2310)  LORazepam (ATIVAN)  tablet 1-4 mg (has no administration in time range)  thiamine (VITAMIN B1) tablet 100 mg (has no administration in time range)    Or  thiamine (VITAMIN B1) injection 100 mg (has no administration in time range)  folic acid (FOLVITE) tablet 1 mg (has no administration in time range)  multivitamin with minerals tablet 1 tablet (has no administration in time range)  sodium chloride 0.9 % bolus 1,000 mL (1,000 mLs Intravenous New Bag/Given 07/19/22 2011)  sodium chloride 0.9 % bolus 1,000 mL (1,000 mLs Intravenous New Bag/Given 07/19/22 2102)  famotidine (PEPCID) IVPB 20 mg premix (0 mg Intravenous Stopped 07/19/22 2310)  ketorolac (TORADOL) 30 MG/ML injection 30 mg (30 mg Intravenous Given 07/19/22 2131)  amLODipine (NORVASC) tablet 10 mg (10 mg Oral Given 07/19/22 2130)  LORazepam (ATIVAN) tablet 2 mg (2 mg Oral Given 07/19/22 2130)  metoprolol tartrate (LOPRESSOR) injection 5 mg (5 mg Intravenous Given 07/20/22 0003)                                                                                                                                     Procedures Procedures  (including critical care time)  Medical Decision Making / ED Course    Medical Decision Making:    Huriel Iktan Boyce is a 52 y.o. male with past medical history as below, significant for polysubstance use, chronic alcohol abuse, WPW, chronic pancreatitis, bipolar disorder, asthma, hypertension, verbal compliance medications who presents to the ED with complaint of ?seizure. . The complaint involves an extensive differential diagnosis and also carries with it a high risk of complications and morbidity.  Serious etiology was considered. Ddx includes but is not limited to: Differential diagnoses for altered mental status includes but is not exclusive to alcohol, illicit or prescription medications, intracranial pathology such as stroke, intracerebral hemorrhage, fever or infectious causes including sepsis, hypoxemia, uremia,  trauma, endocrine related disorders such as diabetes, hypoglycemia, thyroid-related diseases, etc.    Complete initial physical exam performed, notably the patient  was postictal, tachycardic.    Reviewed and confirmed nursing documentation for past medical history, family history, social history.  Vital signs reviewed.   On recheck he is feeling better, mental status improved, - does report that he has not taken his medications over the past 2 days, used cocaine 1 to 2 days ago.  Last alcohol was approximate 1 week ago.    Clinical Course as of 07/20/22 0004  Tue Jul 19, 2022  2052 Echo reviewed, will give 2nd L IVF [SG]  2122 Pt reports he has not taken his blood pressure medications in the past 2 days, he also used cocaine 1 day ago.  Prior seizure approximately year and a half ago assist with alcohol use.  Not take AEDs.  No alcohol in approximately 1 to 1.5 weeks. [SG]    Clinical Course User Index [SG] Jeanell Sparrow, DO   Patient with tachycardia, hypertension, has not taken his blood pressure medications including beta-blocker in the past 2 days.  Will give Norvasc and metoprolol.  Last cocaine use was 2 days ago.  Will give IV fluids and reassess.  Patient was also given benzo given can use, chronic alcohol abuse.  CIWA protocol ordered.  Labs reviewed, troponin is mildly elevated at 20, no chest pain.  Favor demand ischemia in setting of tachycardia  CPK is elevated 634, patient with reported seizure-like activity prior  to arrival.  Calcium low, replaced intravenously.  Bicarb is also low.  Similar to prior reading.  Hemoglobin similar to prior.  Patient with persistent tachycardia, unclear etiology, possibly secondary to alcohol withdrawal, missed med dose, cocaine abuse  Patient signed out to incoming EDP pending reassessment medication, patient may require admission given persistent tachycardia, some possible alcohol withdrawal versus other sources as above   Additional  history obtained: -Additional history obtained from ems -External records from outside source obtained and reviewed including: Chart review including previous notes, labs, imaging, consultation notes including prior ED visits, prior labs and imaging, home medications   Lab Tests: -I ordered, reviewed, and interpreted labs.   The pertinent results include:   Labs Reviewed  CBC WITH DIFFERENTIAL/PLATELET - Abnormal; Notable for the following components:      Result Value   RBC 3.40 (*)    Hemoglobin 10.7 (*)    HCT 33.0 (*)    RDW 16.6 (*)    Platelets 132 (*)    Abs Immature Granulocytes 0.12 (*)    All other components within normal limits  COMPREHENSIVE METABOLIC PANEL - Abnormal; Notable for the following components:   CO2 10 (*)    Glucose, Bld 172 (*)    BUN <5 (*)    Creatinine, Ser 1.27 (*)    Calcium 5.9 (*)    AST 175 (*)    ALT 56 (*)    Anion gap 23 (*)    All other components within normal limits  CK - Abnormal; Notable for the following components:   Total CK 634 (*)    All other components within normal limits  T4, FREE - Abnormal; Notable for the following components:   Free T4 0.59 (*)    All other components within normal limits  TROPONIN I (HIGH SENSITIVITY) - Abnormal; Notable for the following components:   Troponin I (High Sensitivity) 20 (*)    All other components within normal limits  RESP PANEL BY RT-PCR (RSV, FLU A&B, COVID)  RVPGX2  LIPASE, BLOOD  TSH  ETHANOL  URINALYSIS, ROUTINE W REFLEX MICROSCOPIC  RAPID URINE DRUG SCREEN, HOSP PERFORMED  T3, FREE  TROPONIN I (HIGH SENSITIVITY)    Notable for anemia, mild elevated troponin, favor demand ischemia in setting of tachycardia, low bicarb  EKG   EKG Interpretation  Date/Time:  Tuesday July 19 2022 19:46:59 EDT Ventricular Rate:  157 PR Interval:  68 QRS Duration: 78 QT Interval:  281 QTC Calculation: 469 R Axis:   79 Text Interpretation: Sinus tachycardia Paired ventricular premature  complexes Abnormal R-wave progression, early transition Repolarization abnormality, prob rate related Confirmed by Ripley Fraise [phone]) on 07/19/2022 11:52:39 PM         Imaging Studies ordered: I ordered imaging studies including CT head, CT AP I independently visualized the following imaging with scope of interpretation limited to determining acute life threatening conditions related to emergency care: chronic changes  I independently visualized and interpreted imaging. I agree with the radiologist interpretation   Medicines ordered and prescription drug management: Meds ordered this encounter  Medications  . sodium chloride 0.9 % bolus 1,000 mL  . sodium chloride 0.9 % bolus 1,000 mL  . famotidine (PEPCID) IVPB 20 mg premix  . ketorolac (TORADOL) 30 MG/ML injection 30 mg  . amLODipine (NORVASC) tablet 10 mg  . LORazepam (ATIVAN) tablet 2 mg  . calcium gluconate 1 g/ 50 mL sodium chloride IVPB  . metoprolol tartrate (LOPRESSOR) injection 5 mg  . LORazepam (  ATIVAN) tablet 1-4 mg    Order Specific Question:   CIWA-AR < 5 =    Answer:   0 mg    Order Specific Question:   CIWA-AR 5 -10 =    Answer:   1 mg    Order Specific Question:   CIWA-AR 11 -15 =    Answer:   2 mg    Order Specific Question:   CIWA-AR 16 -20 =    Answer:   3 mg    Order Specific Question:   CIWA-AR 16 -20 =    Answer:   Recheck CIWA-AR in 1 hour; if > 20 notify MD    Order Specific Question:   CIWA-AR > 20 =    Answer:   4 mg    Order Specific Question:   CIWA-AR > 20 =    Answer:   Call Rapid Response  . OR Linked Order Group   . thiamine (VITAMIN B1) tablet 100 mg   . thiamine (VITAMIN B1) injection 123XX123 mg  . folic acid (FOLVITE) tablet 1 mg  . multivitamin with minerals tablet 1 tablet    -I have reviewed the patients home medicines and have made adjustments as needed   Consultations Obtained: na   Cardiac Monitoring: The patient was maintained on a cardiac monitor.  I personally viewed  and interpreted the cardiac monitored which showed an underlying rhythm of: sinus tachy  Social Determinants of Health:  Diagnosis or treatment significantly limited by social determinants of health: current smoker, alcohol use, and polysubstance abuse Counseled patient for approximately 3 minutes regarding smoking cessation. Discussed risks of smoking and how they applied and affected their visit here today. Patient not ready to quit at this time, however will follow up with their primary doctor when they are.   CPT code: [phone]: intermediate counseling for smoking cessation    Reevaluation: After the interventions noted above, I reevaluated the patient and found that they have stayed the same  Co morbidities that complicate the patient evaluation . Past Medical History:  Diagnosis Date  . Alcoholism /alcohol abuse   . Anemia   . Anxiety   . Arthritis    knees; arms; elbows (03/26/2015)  . Asthma   . Bipolar disorder ([name])   . Chronic bronchitis ([place])   . Chronic lower back pain   . Chronic pancreatitis ([place])   . Cocaine abuse ([place])   . Depression   . Family history of adverse reaction to anesthesia   . Femoral condyle fracture ([place]) 03/08/2014   left medial/notes 03/09/2014  . GERD (gastroesophageal reflux disease)   . H/O hiatal hernia   . H/O suicide attempt 10/2012  . High cholesterol   . History of blood transfusion 10/2012   when I tried to commit suicide  . History of stomach ulcers   . Hypertension   . Marijuana abuse, continuous   . Migraine    a few times/year (03/26/2015)  . Pneumonia 1990's X 3  . PTSD (post-traumatic stress disorder)   . Sickle cell trait ([place])   . WPW (Wolff-Parkinson-White syndrome)    [name] 03/06/2013      Dispostion: Disposition decision including need for hospitalization was considered, and patient disposition pending at time of sign out.    Final Clinical Impression(s) / ED Diagnoses Final diagnoses:  None     This  chart was dictated using voice recognition software.  Despite best efforts to proofread,  errors can occur which can change the documentation  meaning.\

## 2022-07-19 NOTE — ED Triage Notes (Signed)
Pt [facility] ED from [facility] for seizure like activity. Pt postictal and altered on scene per EMS. Pt was given '5mg'$  of versed because of combative behavior. CBG 187. Unknown if pt hit ground and unknown time. No blood thinners reported. Pt with hx of WPW

## 2022-07-20 ENCOUNTER — Inpatient Hospital Stay (HOSPITAL_COMMUNITY): Payer: Medicaid Other

## 2022-07-20 DIAGNOSIS — Z79899 Other long term (current) drug therapy: Secondary | ICD-10-CM | POA: Diagnosis not present

## 2022-07-20 DIAGNOSIS — D573 Sickle-cell trait: Secondary | ICD-10-CM | POA: Diagnosis present

## 2022-07-20 DIAGNOSIS — M6282 Rhabdomyolysis: Secondary | ICD-10-CM | POA: Diagnosis present

## 2022-07-20 DIAGNOSIS — E7800 Pure hypercholesterolemia, unspecified: Secondary | ICD-10-CM | POA: Diagnosis present

## 2022-07-20 DIAGNOSIS — R000 Tachycardia, unspecified: Secondary | ICD-10-CM

## 2022-07-20 DIAGNOSIS — K860 Alcohol-induced chronic pancreatitis: Secondary | ICD-10-CM | POA: Diagnosis present

## 2022-07-20 DIAGNOSIS — Z1152 Encounter for screening for COVID-19: Secondary | ICD-10-CM | POA: Diagnosis not present

## 2022-07-20 DIAGNOSIS — G40509 Epileptic seizures related to external causes, not intractable, without status epilepticus: Secondary | ICD-10-CM | POA: Diagnosis present

## 2022-07-20 DIAGNOSIS — Z681 Body mass index (BMI) 19 or less, adult: Secondary | ICD-10-CM | POA: Diagnosis not present

## 2022-07-20 DIAGNOSIS — I10 Essential (primary) hypertension: Secondary | ICD-10-CM | POA: Diagnosis present

## 2022-07-20 DIAGNOSIS — F1010 Alcohol abuse, uncomplicated: Secondary | ICD-10-CM | POA: Diagnosis not present

## 2022-07-20 DIAGNOSIS — F17210 Nicotine dependence, cigarettes, uncomplicated: Secondary | ICD-10-CM | POA: Diagnosis present

## 2022-07-20 DIAGNOSIS — I472 Ventricular tachycardia, unspecified: Secondary | ICD-10-CM | POA: Diagnosis not present

## 2022-07-20 DIAGNOSIS — F10139 Alcohol abuse with withdrawal, unspecified: Secondary | ICD-10-CM | POA: Diagnosis present

## 2022-07-20 DIAGNOSIS — K7010 Alcoholic hepatitis without ascites: Secondary | ICD-10-CM | POA: Diagnosis present

## 2022-07-20 DIAGNOSIS — K863 Pseudocyst of pancreas: Secondary | ICD-10-CM | POA: Diagnosis present

## 2022-07-20 DIAGNOSIS — E872 Acidosis, unspecified: Secondary | ICD-10-CM | POA: Diagnosis present

## 2022-07-20 DIAGNOSIS — F1910 Other psychoactive substance abuse, uncomplicated: Secondary | ICD-10-CM | POA: Diagnosis not present

## 2022-07-20 DIAGNOSIS — R569 Unspecified convulsions: Secondary | ICD-10-CM

## 2022-07-20 DIAGNOSIS — F1410 Cocaine abuse, uncomplicated: Secondary | ICD-10-CM | POA: Diagnosis present

## 2022-07-20 DIAGNOSIS — E876 Hypokalemia: Secondary | ICD-10-CM | POA: Diagnosis present

## 2022-07-20 DIAGNOSIS — G894 Chronic pain syndrome: Secondary | ICD-10-CM | POA: Diagnosis present

## 2022-07-20 DIAGNOSIS — I480 Paroxysmal atrial fibrillation: Secondary | ICD-10-CM | POA: Diagnosis present

## 2022-07-20 DIAGNOSIS — I4891 Unspecified atrial fibrillation: Secondary | ICD-10-CM | POA: Diagnosis not present

## 2022-07-20 DIAGNOSIS — R55 Syncope and collapse: Secondary | ICD-10-CM | POA: Diagnosis present

## 2022-07-20 DIAGNOSIS — F319 Bipolar disorder, unspecified: Secondary | ICD-10-CM | POA: Diagnosis present

## 2022-07-20 DIAGNOSIS — T405X1A Poisoning by cocaine, accidental (unintentional), initial encounter: Secondary | ICD-10-CM | POA: Diagnosis present

## 2022-07-20 DIAGNOSIS — Y92009 Unspecified place in unspecified non-institutional (private) residence as the place of occurrence of the external cause: Secondary | ICD-10-CM | POA: Diagnosis not present

## 2022-07-20 LAB — I-STAT VENOUS BLOOD GAS, ED
Acid-base deficit: 14 mmol/L — ABNORMAL HIGH (ref 0.0–2.0)
Bicarbonate: 11.7 mmol/L — ABNORMAL LOW (ref 20.0–28.0)
Calcium, Ion: 0.68 mmol/L — CL (ref 1.15–1.40)
HCT: 36 % — ABNORMAL LOW (ref 39.0–52.0)
Hemoglobin: 12.2 g/dL — ABNORMAL LOW (ref 13.0–17.0)
O2 Saturation: 99 %
Potassium: 3.6 mmol/L (ref 3.5–5.1)
Sodium: 142 mmol/L (ref 135–145)
TCO2: 13 mmol/L — ABNORMAL LOW (ref 22–32)
pCO2, Ven: 26.4 mmHg — ABNORMAL LOW (ref 44–60)
pH, Ven: 7.257 (ref 7.25–7.43)
pO2, Ven: 187 mmHg — ABNORMAL HIGH (ref 32–45)

## 2022-07-20 LAB — BASIC METABOLIC PANEL
Anion gap: 14 (ref 5–15)
Anion gap: 12 (ref 5–15)
BUN: <5 mg/dL — ABNORMAL LOW (ref 6–20)
BUN: <5 mg/dL — ABNORMAL LOW (ref 6–20)
CO2: 12 mmol/L — ABNORMAL LOW (ref 22–32)
CO2: 19 mmol/L — ABNORMAL LOW (ref 22–32)
Calcium: 5.6 mg/dL — CL (ref 8.9–10.3)
Calcium: 7.1 mg/dL — ABNORMAL LOW (ref 8.9–10.3)
Chloride: 115 mmol/L — ABNORMAL HIGH (ref 98–111)
Chloride: 106 mmol/L (ref 98–111)
Creatinine, Ser: 0.76 mg/dL (ref 0.61–1.24)
Creatinine, Ser: 0.91 mg/dL (ref 0.61–1.24)
GFR, Estimated: >60 mL/min (ref >60)
GFR, Estimated: >60 mL/min (ref >60)
Glucose, Bld: 72 mg/dL (ref 70–99)
Glucose, Bld: 156 mg/dL — ABNORMAL HIGH (ref 70–99)
Potassium: 3.4 mmol/L — ABNORMAL LOW (ref 3.5–5.1)
Potassium: 3.3 mmol/L — ABNORMAL LOW (ref 3.5–5.1)
Sodium: 141 mmol/L (ref 135–145)
Sodium: 137 mmol/L (ref 135–145)

## 2022-07-20 LAB — CBC
HCT: 37.5 % — ABNORMAL LOW (ref 39.0–52.0)
Hemoglobin: 12 g/dL — ABNORMAL LOW (ref 13.0–17.0)
MCH: 31.1 pg (ref 26.0–34.0)
MCHC: 32 g/dL (ref 30.0–36.0)
MCV: 97.2 fL (ref 80.0–100.0)
Platelets: 124 10*3/uL — ABNORMAL LOW (ref 150–400)
RBC: 3.86 MIL/uL — ABNORMAL LOW (ref 4.22–5.81)
RDW: 16.9 % — ABNORMAL HIGH (ref 11.5–15.5)
WBC: 11.1 10*3/uL — ABNORMAL HIGH (ref 4.0–10.5)
nRBC: 0 % (ref 0.0–0.2)

## 2022-07-20 LAB — COMPREHENSIVE METABOLIC PANEL
ALT: 44 U/L (ref 0–44)
AST: 115 U/L — ABNORMAL HIGH (ref 15–41)
Albumin: 3.2 g/dL — ABNORMAL LOW (ref 3.5–5.0)
Alkaline Phosphatase: 84 U/L (ref 38–126)
Anion gap: 14 (ref 5–15)
BUN: <5 mg/dL — ABNORMAL LOW (ref 6–20)
CO2: 18 mmol/L — ABNORMAL LOW (ref 22–32)
Calcium: 7 mg/dL — ABNORMAL LOW (ref 8.9–10.3)
Chloride: 104 mmol/L (ref 98–111)
Creatinine, Ser: 0.86 mg/dL (ref 0.61–1.24)
GFR, Estimated: >60 mL/min (ref >60)
Glucose, Bld: 202 mg/dL — ABNORMAL HIGH (ref 70–99)
Potassium: 3.1 mmol/L — ABNORMAL LOW (ref 3.5–5.1)
Sodium: 136 mmol/L (ref 135–145)
Total Bilirubin: 0.5 mg/dL (ref 0.3–1.2)
Total Protein: 6 g/dL — ABNORMAL LOW (ref 6.5–8.1)

## 2022-07-20 LAB — TROPONIN I (HIGH SENSITIVITY): Troponin I (High Sensitivity): 14 ng/L (ref <18)

## 2022-07-20 LAB — VITAMIN D 25 HYDROXY (VIT D DEFICIENCY, FRACTURES): Vit D, 25-Hydroxy: 30.51 ng/mL (ref 30–100)

## 2022-07-20 LAB — PHOSPHORUS
Phosphorus: 4.1 mg/dL (ref 2.5–4.6)
Phosphorus: 3.6 mg/dL (ref 2.5–4.6)

## 2022-07-20 LAB — LACTIC ACID, PLASMA: Lactic Acid, Venous: 1.8 mmol/L (ref 0.5–1.9)

## 2022-07-20 LAB — MAGNESIUM
Magnesium: 0.8 mg/dL — CL (ref 1.7–2.4)
Magnesium: 1.7 mg/dL (ref 1.7–2.4)

## 2022-07-20 MED ORDER — HYDROCODONE-ACETAMINOPHEN 5-325 MG PO TABS
1.0000 | ORAL_TABLET | Freq: Four times a day (QID) | ORAL | Status: DC | PRN
  Administered 2022-07-20 – 2022-07-22 (×3): 2 via ORAL
  Administered 2022-07-22: 1 via ORAL
  Filled 2022-07-20 (×4): qty 2

## 2022-07-20 MED ORDER — AMIODARONE HCL IN DEXTROSE 360-4.14 MG/200ML-% IV SOLN: 30.0000 mg/h | INTRAVENOUS | Status: DC

## 2022-07-20 MED ORDER — MAGNESIUM SULFATE 4 GM/100ML IV SOLN
4.0000 g | INTRAVENOUS | Status: AC
  Administered 2022-07-20: 4 g via INTRAVENOUS
  Filled 2022-07-20: qty 100

## 2022-07-20 MED ORDER — MIDODRINE HCL 5 MG PO TABS
10.0000 mg | ORAL_TABLET | ORAL | Status: AC
  Administered 2022-07-20: 10 mg via ORAL
  Filled 2022-07-20: qty 2

## 2022-07-20 MED ORDER — AMIODARONE LOAD VIA INFUSION
150.0000 mg | Freq: Once | INTRAVENOUS | Status: AC
  Administered 2022-07-20: 150 mg via INTRAVENOUS
  Filled 2022-07-20: qty 83.34

## 2022-07-20 MED ORDER — MELATONIN 5 MG PO TABS
5.0000 mg | ORAL_TABLET | Freq: Every evening | ORAL | Status: DC | PRN
  Administered 2022-07-22: 5 mg via ORAL
  Filled 2022-07-20: qty 1

## 2022-07-20 MED ORDER — ALUM & MAG HYDROXIDE-SIMETH 200-200-20 MG/5ML PO SUSP
30.0000 mL | Freq: Four times a day (QID) | ORAL | Status: DC | PRN
  Administered 2022-07-20: 30 mL via ORAL
  Filled 2022-07-20: qty 30

## 2022-07-20 MED ORDER — ADULT MULTIVITAMIN W/MINERALS CH: 1.0000 | ORAL_TABLET | Freq: Every day | ORAL | Status: DC

## 2022-07-20 MED ORDER — PROCHLORPERAZINE EDISYLATE 10 MG/2ML IJ SOLN: 5.0000 mg | Freq: Four times a day (QID) | INTRAMUSCULAR | Status: DC | PRN

## 2022-07-20 MED ORDER — AMIODARONE HCL IN DEXTROSE 360-4.14 MG/200ML-% IV SOLN
30.0000 mg/h | INTRAVENOUS | Status: AC
  Administered 2022-07-20 (×2): 60 mg/h via INTRAVENOUS
  Administered 2022-07-21: 30 mg/h via INTRAVENOUS
  Administered 2022-07-21 (×2): 60 mg/h via INTRAVENOUS
  Administered 2022-07-22 (×2): 30 mg/h via INTRAVENOUS
  Filled 2022-07-20 (×7): qty 200

## 2022-07-20 MED ORDER — CALCIUM GLUCONATE-NACL 2-0.675 GM/100ML-% IV SOLN: 2.0000 g | Freq: Once | INTRAVENOUS | Status: DC

## 2022-07-20 MED ORDER — CALCIUM GLUCONATE-NACL 2-0.675 GM/100ML-% IV SOLN
2.0000 g | Freq: Once | INTRAVENOUS | Status: AC
  Administered 2022-07-20: 2000 mg via INTRAVENOUS
  Filled 2022-07-20: qty 100

## 2022-07-20 MED ORDER — SODIUM CHLORIDE 0.9 % IV BOLUS
500.0000 mL | INTRAVENOUS | Status: AC
  Administered 2022-07-20: 500 mL via INTRAVENOUS

## 2022-07-20 MED ORDER — SUCRALFATE 1 GM/10ML PO SUSP
1.0000 g | Freq: Three times a day (TID) | ORAL | Status: DC
  Administered 2022-07-20 – 2022-07-22 (×10): 1 g via ORAL
  Filled 2022-07-20 (×10): qty 10

## 2022-07-20 MED ORDER — ALBUTEROL SULFATE (2.5 MG/3ML) 0.083% IN NEBU: 3.0000 mL | INHALATION_SOLUTION | RESPIRATORY_TRACT | Status: DC | PRN

## 2022-07-20 MED ORDER — MAGNESIUM SULFATE 2 GM/50ML IV SOLN
2.0000 g | INTRAVENOUS | Status: AC
  Administered 2022-07-20: 2 g via INTRAVENOUS
  Filled 2022-07-20: qty 50

## 2022-07-20 MED ORDER — MAGNESIUM SULFATE 4 GM/100ML IV SOLN: 4.0000 g | Freq: Once | INTRAVENOUS | Status: DC

## 2022-07-20 MED ORDER — POLYETHYLENE GLYCOL 3350 17 G PO PACK: 17.0000 g | PACK | Freq: Every day | ORAL | Status: DC | PRN

## 2022-07-20 MED ORDER — FOLIC ACID 1 MG PO TABS: 1.0000 mg | ORAL_TABLET | Freq: Every day | ORAL | Status: DC

## 2022-07-20 MED ORDER — LORAZEPAM 1 MG PO TABS
1.0000 mg | ORAL_TABLET | ORAL | Status: DC | PRN
  Administered 2022-07-20: 1 mg via ORAL
  Administered 2022-07-20 (×2): 2 mg via ORAL
  Filled 2022-07-20: qty 1
  Filled 2022-07-20 (×2): qty 2

## 2022-07-20 MED ORDER — METOPROLOL TARTRATE 5 MG/5ML IV SOLN
2.5000 mg | INTRAVENOUS | Status: AC
  Administered 2022-07-20: 2.5 mg via INTRAVENOUS

## 2022-07-20 MED ORDER — PANCRELIPASE (LIP-PROT-AMYL) 12000-38000 UNITS PO CPEP
36000.0000 [IU] | ORAL_CAPSULE | Freq: Three times a day (TID) | ORAL | Status: DC
  Administered 2022-07-20 – 2022-07-23 (×10): 36000 [IU] via ORAL
  Filled 2022-07-20 (×2): qty 3
  Filled 2022-07-20 (×4): qty 1
  Filled 2022-07-20 (×2): qty 3
  Filled 2022-07-20 (×2): qty 1
  Filled 2022-07-20: qty 3
  Filled 2022-07-20 (×2): qty 1
  Filled 2022-07-20 (×2): qty 3

## 2022-07-20 MED ORDER — SODIUM CHLORIDE 0.9 % IV BOLUS (SEPSIS)
1000.0000 mL | Freq: Once | INTRAVENOUS | Status: AC
  Administered 2022-07-20: 1000 mL via INTRAVENOUS

## 2022-07-20 MED ORDER — LORAZEPAM 2 MG/ML IJ SOLN
2.0000 mg | Freq: Four times a day (QID) | INTRAMUSCULAR | Status: DC | PRN
  Filled 2022-07-20 (×2): qty 1

## 2022-07-20 MED ORDER — POTASSIUM CHLORIDE CRYS ER 20 MEQ PO TBCR
40.0000 meq | EXTENDED_RELEASE_TABLET | Freq: Two times a day (BID) | ORAL | Status: AC
  Administered 2022-07-20 (×2): 40 meq via ORAL
  Filled 2022-07-20 (×2): qty 2

## 2022-07-20 MED ORDER — FOLIC ACID 1 MG PO TABS
1.0000 mg | ORAL_TABLET | Freq: Every day | ORAL | Status: DC
  Administered 2022-07-20 – 2022-07-23 (×4): 1 mg via ORAL
  Filled 2022-07-20 (×4): qty 1

## 2022-07-20 MED ORDER — METOPROLOL TARTRATE 25 MG PO TABS
25.0000 mg | ORAL_TABLET | Freq: Two times a day (BID) | ORAL | Status: DC
  Administered 2022-07-20: 25 mg via ORAL
  Filled 2022-07-20: qty 1

## 2022-07-20 MED ORDER — METOPROLOL TARTRATE 5 MG/5ML IV SOLN
2.5000 mg | Freq: Once | INTRAVENOUS | Status: AC
  Administered 2022-07-20: 2.5 mg via INTRAVENOUS
  Filled 2022-07-20: qty 5

## 2022-07-20 MED ORDER — PANTOPRAZOLE SODIUM 40 MG PO TBEC
40.0000 mg | DELAYED_RELEASE_TABLET | Freq: Two times a day (BID) | ORAL | Status: DC
  Administered 2022-07-20 – 2022-07-23 (×7): 40 mg via ORAL
  Filled 2022-07-20 (×7): qty 1

## 2022-07-20 MED ORDER — THIAMINE HCL 100 MG/ML IJ SOLN
100.0000 mg | Freq: Every day | INTRAMUSCULAR | Status: DC
  Filled 2022-07-20 (×2): qty 2

## 2022-07-20 MED ORDER — LORAZEPAM 2 MG/ML IJ SOLN
1.0000 mg | INTRAMUSCULAR | Status: DC | PRN
  Administered 2022-07-20 – 2022-07-21 (×2): 4 mg via INTRAVENOUS
  Administered 2022-07-21 (×2): 2 mg via INTRAVENOUS
  Administered 2022-07-21: 1 mg via INTRAVENOUS
  Administered 2022-07-22: 2 mg via INTRAVENOUS
  Filled 2022-07-20: qty 2
  Filled 2022-07-20 (×2): qty 1

## 2022-07-20 MED ORDER — THIAMINE MONONITRATE 100 MG PO TABS
100.0000 mg | ORAL_TABLET | Freq: Every day | ORAL | Status: DC
  Administered 2022-07-20 – 2022-07-23 (×4): 100 mg via ORAL
  Filled 2022-07-20 (×4): qty 1

## 2022-07-20 MED ORDER — DULOXETINE HCL 60 MG PO CPEP
60.0000 mg | ORAL_CAPSULE | Freq: Every day | ORAL | Status: DC
  Administered 2022-07-20 – 2022-07-22 (×3): 60 mg via ORAL
  Filled 2022-07-20: qty 2
  Filled 2022-07-20 (×2): qty 1

## 2022-07-20 MED ORDER — SODIUM BICARBONATE 8.4 % IV SOLN
100.0000 meq | Freq: Once | INTRAVENOUS | Status: AC
  Administered 2022-07-20: 100 meq via INTRAVENOUS
  Filled 2022-07-20: qty 50

## 2022-07-20 MED ORDER — HYDROMORPHONE HCL 1 MG/ML IJ SOLN
0.5000 mg | INTRAMUSCULAR | Status: DC | PRN
  Administered 2022-07-20 (×2): 0.5 mg via INTRAVENOUS
  Filled 2022-07-20 (×2): qty 1

## 2022-07-20 MED ORDER — MAGNESIUM SULFATE 2 GM/50ML IV SOLN
2.0000 g | Freq: Once | INTRAVENOUS | Status: AC
  Administered 2022-07-20: 2 g via INTRAVENOUS
  Filled 2022-07-20: qty 50

## 2022-07-20 MED ORDER — LORAZEPAM 2 MG/ML IJ SOLN
0.0000 mg | Freq: Four times a day (QID) | INTRAMUSCULAR | Status: AC
  Administered 2022-07-21 – 2022-07-22 (×2): 1 mg via INTRAVENOUS
  Filled 2022-07-20: qty 2
  Filled 2022-07-20 (×2): qty 1

## 2022-07-20 MED ORDER — CALCITRIOL 0.5 MCG PO CAPS
0.5000 ug | ORAL_CAPSULE | Freq: Every day | ORAL | Status: DC
  Administered 2022-07-20 – 2022-07-23 (×4): 0.5 ug via ORAL
  Filled 2022-07-20 (×6): qty 1

## 2022-07-20 MED ORDER — ENOXAPARIN SODIUM 40 MG/0.4ML IJ SOSY
40.0000 mg | PREFILLED_SYRINGE | Freq: Every day | INTRAMUSCULAR | Status: DC
  Administered 2022-07-20 – 2022-07-23 (×4): 40 mg via SUBCUTANEOUS
  Filled 2022-07-20 (×4): qty 0.4

## 2022-07-20 MED ORDER — AMIODARONE HCL IN DEXTROSE 360-4.14 MG/200ML-% IV SOLN
30.0000 mg/h | INTRAVENOUS | Status: DC
  Administered 2022-07-20: 30 mg/h via INTRAVENOUS
  Filled 2022-07-20: qty 200

## 2022-07-20 MED ORDER — SODIUM BICARBONATE 8.4 % IV SOLN
INTRAVENOUS | Status: AC
  Filled 2022-07-20: qty 1000

## 2022-07-20 MED ORDER — METOPROLOL SUCCINATE ER 25 MG PO TB24
12.5000 mg | ORAL_TABLET | Freq: Every day | ORAL | Status: DC
  Administered 2022-07-20: 12.5 mg via ORAL
  Filled 2022-07-20: qty 1

## 2022-07-20 MED ORDER — METOPROLOL TARTRATE 5 MG/5ML IV SOLN
5.0000 mg | INTRAVENOUS | Status: DC | PRN
  Filled 2022-07-20: qty 5

## 2022-07-20 MED ORDER — HYDROMORPHONE HCL 1 MG/ML IJ SOLN
1.0000 mg | INTRAMUSCULAR | Status: DC | PRN
  Administered 2022-07-20 – 2022-07-21 (×2): 1 mg via INTRAVENOUS
  Filled 2022-07-20 (×2): qty 1

## 2022-07-20 MED ORDER — THIAMINE MONONITRATE 100 MG PO TABS: 100.0000 mg | ORAL_TABLET | Freq: Every day | ORAL | Status: DC

## 2022-07-20 MED ORDER — THIAMINE HCL 100 MG/ML IJ SOLN: 100.0000 mg | Freq: Every day | INTRAMUSCULAR | Status: DC

## 2022-07-20 MED ORDER — LORAZEPAM 1 MG PO TABS
1.0000 mg | ORAL_TABLET | ORAL | Status: DC | PRN
  Administered 2022-07-21: 2 mg via ORAL
  Administered 2022-07-21: 1 mg via ORAL
  Filled 2022-07-20: qty 2
  Filled 2022-07-20: qty 1
  Filled 2022-07-20: qty 2

## 2022-07-20 MED ORDER — LORAZEPAM 2 MG/ML IJ SOLN
0.0000 mg | Freq: Two times a day (BID) | INTRAMUSCULAR | Status: DC
  Administered 2022-07-22: 2 mg via INTRAVENOUS
  Filled 2022-07-20: qty 1

## 2022-07-20 MED ORDER — OYSTER SHELL CALCIUM/D3 500-5 MG-MCG PO TABS
1.0000 | ORAL_TABLET | Freq: Two times a day (BID) | ORAL | Status: DC
  Administered 2022-07-20 – 2022-07-23 (×6): 1 via ORAL
  Filled 2022-07-20 (×10): qty 1

## 2022-07-20 MED ORDER — METOPROLOL TARTRATE 25 MG PO TABS
25.0000 mg | ORAL_TABLET | Freq: Four times a day (QID) | ORAL | Status: DC
  Administered 2022-07-20 – 2022-07-21 (×5): 25 mg via ORAL
  Filled 2022-07-20 (×5): qty 1

## 2022-07-20 MED ORDER — DICYCLOMINE HCL 10 MG PO CAPS
10.0000 mg | ORAL_CAPSULE | Freq: Two times a day (BID) | ORAL | Status: DC
  Administered 2022-07-20 – 2022-07-23 (×7): 10 mg via ORAL
  Filled 2022-07-20 (×7): qty 1

## 2022-07-20 MED ORDER — POTASSIUM CHLORIDE CRYS ER 20 MEQ PO TBCR
40.0000 meq | EXTENDED_RELEASE_TABLET | Freq: Once | ORAL | Status: AC
  Administered 2022-07-20: 40 meq via ORAL
  Filled 2022-07-20: qty 2

## 2022-07-20 MED ORDER — ADULT MULTIVITAMIN W/MINERALS CH
1.0000 | ORAL_TABLET | Freq: Every day | ORAL | Status: DC
  Administered 2022-07-20 – 2022-07-23 (×4): 1 via ORAL
  Filled 2022-07-20 (×4): qty 1

## 2022-07-20 MED ORDER — AMIODARONE HCL IN DEXTROSE 360-4.14 MG/200ML-% IV SOLN
60.0000 mg/h | INTRAVENOUS | Status: AC
  Administered 2022-07-20 (×2): 60 mg/h via INTRAVENOUS

## 2022-07-20 NOTE — ED Notes (Signed)
Patient continues to be uncooperative, up out of bed, not following directions, swearing at staff and refusing to use call light or remain in bed despite being attached to monitor and IV fluids. Patient has been asked multiple times to remain in bed and use call light when needed, patient stated he would do so, but continues to be uncooperative.

## 2022-07-20 NOTE — H&P (Addendum)
History and Physical  [name] [id number] DOB: 1970-10-07 DOA: 07/19/2022  Referring physician: Dr. Christy Gentles, [place]. PCP: Pcp, No  Outpatient Specialists: GI, cardiology. Patient coming from: Home  Chief Complaint: Seizure activity  HPI: [name] is a 52 y.o. male with medical history significant for alcohol induced chronic pancreatitis, paroxysmal atrial fibrillation, WPW palpable, bipolar disorder, chronic anxiety/depression, chronic pancreatitis, polysubstance abuse including alcohol and cocaine, hyperlipidemia, hypertension, who presented to [facility] ED from home after witnessed seizure-like activity.  Patient with history of alcohol abuse.  He reports his last alcoholic drink was on Friday, 5 days ago.  EMS was activated.  He received Versed en route.  In the ED, CT head nonacute.  Lab studies notable for electrolytes derangement.  Received electrolyte replacement and IV fluid boluses.  Initially postictal, mentation is improved at the time of this visit.  TRH, hospitalist service, was asked to admit.  ED Course: Tmax 97.9.  BP 143/106, pulse 121, respiratory 26, with saturation 99% on room air.  Lab studies remarkable for potassium 3.4, serum bicarb 10, 12, corrected calcium for albumin 6.0, hemoglobin 10.7.  Platelet count 132.  Review of Systems: Review of systems as noted in the HPI. All other systems reviewed and are negative.   Past Medical History:  Diagnosis Date   Alcoholism /alcohol abuse    Anemia    Anxiety    Arthritis    knees; arms; elbows (03/26/2015)   Asthma    Bipolar disorder (HCC)    Chronic bronchitis (HCC)    Chronic lower back pain    Chronic pancreatitis ([place])    Cocaine abuse ([place])    Depression    Family history of adverse reaction to anesthesia    Femoral condyle fracture ([place]) 03/08/2014   left medial/notes 03/09/2014   GERD (gastroesophageal reflux disease)    H/O hiatal hernia    H/O suicide attempt 10/2012   High  cholesterol    History of blood transfusion 10/2012   when I tried to commit suicide   History of stomach ulcers    Hypertension    Marijuana abuse, continuous    Migraine    a few times/year (03/26/2015)   Pneumonia 1990's X 3   PTSD (post-traumatic stress disorder)    Sickle cell trait ([place])    WPW (Wolff-Parkinson-White syndrome)    [name] 03/06/2013   Past Surgical History:  Procedure Laterality Date   BIOPSY  11/25/2017   Procedure: BIOPSY;  Surgeon: Arta Silence, MD;  Location: [place];  Service: Endoscopy;;   BIOPSY  10/14/2018   Procedure: BIOPSY;  Surgeon: Arta Silence, MD;  Location: [place];  Service: Endoscopy;;   CARDIAC CATHETERIZATION     CYST ENTEROSTOMY  01/02/2020   Procedure: CYST ASPIRATION;  Surgeon: Milus Banister, MD;  Location: [facility] ENDOS[facility];  Service: Endoscopy;;   ESOPHAGOGASTRODUODENOSCOPY (EGD) WITH PROPOFOL N/A 11/25/2017   Procedure: ESOPHAGOGASTRODUODENOSCOPY (EGD) WITH PROPOFOL;  Surgeon: Arta Silence, MD;  Location: [place];  Service: Endoscopy;  Laterality: N/A;   ESOPHAGOGASTRODUODENOSCOPY (EGD) WITH PROPOFOL Left 10/14/2018   Procedure: ESOPHAGOGASTRODUODENOSCOPY (EGD) WITH PROPOFOL;  Surgeon: Arta Silence, MD;  Location: [facility] ENDOSCOPY;  Service: Endoscopy;  Laterality: Left;   ESOPHAGOGASTRODUODENOSCOPY (EGD) WITH PROPOFOL N/A 11/14/2018   Procedure: ESOPHAGOGASTRODUODENOSCOPY (EGD) WITH PROPOFOL;  Surgeon: Laurence Spates, MD;  Location: [facility] ENDOSCOPY;  Service: Gastroenterology;  Laterality: N/A;   ESOPHAGOGASTRODUODENOSCOPY (EGD) WITH PROPOFOL N/A 01/02/2020   Procedure: ESOPHAGOGASTRODUODENOSCOPY (EGD) WITH PROPOFOL;  Surgeon: Milus Banister, MD;  Location: [facility];  Service: Endoscopy;  Laterality: N/A;   ESOPHAGOGASTRODUODENOSCOPY (EGD) WITH PROPOFOL N/A 10/25/2020   Procedure: ESOPHAGOGASTRODUODENOSCOPY (EGD) WITH PROPOFOL;  Surgeon: Rush Landmark Telford Nab., MD;  Location: [place];  Service: Gastroenterology;   Laterality: N/A;   EUS N/A 01/02/2020   Procedure: UPPER ENDOSCOPIC ULTRASOUND (EUS) RADIAL;  Surgeon: Milus Banister, MD;  Location: [facility];  Service: Endoscopy;  Laterality: N/A;   EYE SURGERY Left 1990's   "result of trauma"    FACIAL FRACTURE SURGERY Left 1990's   "result of trauma"    FLEXIBLE SIGMOIDOSCOPY N/A 10/25/2020   Procedure: FLEXIBLE SIGMOIDOSCOPY;  Surgeon: Irving Copas., MD;  Location: [place];  Service: Gastroenterology;  Laterality: N/A;   FRACTURE SURGERY     HEMOSTASIS CLIP PLACEMENT  10/25/2020   Procedure: HEMOSTASIS CLIP PLACEMENT;  Surgeon: Irving Copas., MD;  Location: [place];  Service: Gastroenterology;;   HERNIA REPAIR     LEFT HEART CATHETERIZATION WITH CORONARY ANGIOGRAM Right 03/07/2013   Procedure: LEFT HEART CATHETERIZATION WITH CORONARY ANGIOGRAM;  Surgeon: [name], MD;  Location: [place] CATH LAB;  Service: Cardiovascular;  Laterality: Right;   UMBILICAL HERNIA REPAIR     UPPER GASTROINTESTINAL ENDOSCOPY      Social History:  reports that he has been smoking cigarettes and e-cigarettes. He has a 36.00 pack-year smoking history. He has never used smokeless tobacco. He reports that he does not currently use alcohol. He reports that he does not currently use drugs after having used the following drugs: Marijuana and Cocaine.   Allergies  Allergen Reactions   Robaxin [Methocarbamol] Other (See Comments)    jumpy limbs   Aspirin Other (See Comments)    Unknown reaction   Shellfish-Derived Products Nausea And Vomiting and Rash   Trazodone And Nefazodone Other (See Comments)    Muscle spasms   Adhesive [Tape] Itching   Latex Itching   Toradol [Ketorolac Tromethamine] Other (See Comments)    Has ulcers; cannot have this   Contrast Media [Iodinated Contrast Media] Hives   Reglan [Metoclopramide] Other (See Comments)    Muscle spasms   Salmon [Fish Oil] Nausea And Vomiting and Rash    Family History  Problem  Relation Age of Onset   Hypertension Mother    Cirrhosis Father    Alcoholism Father    Hypertension Father    Melanoma Father    Hypertension Other    Coronary artery disease Other       Prior to Admission medications   Medication Sig Start Date End Date Taking? Authorizing Provider  acetaminophen (TYLENOL) 500 MG tablet Take 1,000 mg by mouth every 6 (six) hours as needed for moderate pain or headache.    [provider]  acetaminophen (TYLENOL) 650 MG CR tablet Take 1 tablet (650 mg total) by mouth every 8 (eight) hours as needed for pain. 03/05/22   Nyoka Lint, PA-C  albuterol (VENTOLIN HFA) 108 (90 Base) MCG/ACT inhaler Inhale 2 puffs into the lungs every 4 (four) hours as needed for wheezing or shortness of breath. 08/17/21   [name], MD  amLODipine (NORVASC) 10 MG tablet Take 1 tablet (10 mg total) by mouth daily. 12/25/21   [name], MD  benzonatate (TESSALON) 100 MG capsule Take 1 capsule (100 mg total) by mouth 3 (three) times daily as needed for cough. 03/14/22   Long, Wonda Olds, MD  dextromethorphan-guaiFENesin [facility] DM) 30-600 MG 12hr tablet Take 1 tablet by mouth 2 (two) times daily. 03/05/22  Nyoka Lint, PA-C  dicyclomine (BENTYL) 10 MG capsule Take 1 capsule (10 mg total) by mouth 2 (two) times daily. 03/15/22   [name], [name], MD  DULoxetine (CYMBALTA) 30 MG capsule Take 2 capsules (60 mg total) by mouth at bedtime. Take 1 capsule daily at bedtime for 2 weeks and then increase to 2 capsules at bedtime 04/06/22   [name], [name], MD  folic acid (FOLVITE) 1 MG tablet Take 1 tablet (1 mg total) by mouth daily. 11/26/17   [name], MD  HYDROcodone-acetaminophen (NORCO/VICODIN) 5-325 MG tablet Take 1 tablet by mouth every 12 (twelve) hours as needed for moderate pain. 12/24/21   [name], MD  lidocaine (LMX) 4 % cream Apply topically 4 (four) times daily as needed (NGT pain). 12/24/21   [name], MD  lidocaine  (XYLOCAINE) 2 % solution Use as directed 15 mLs in the mouth or throat every 6 (six) hours as needed for mouth pain. 07/08/22   Ripley Fraise, MD  lipase/protease/amylase (CREON) 36000 UNITS CPEP capsule Take 2 capsules (72,000 Units total) by mouth 3 (three) times daily with meals. May also take 1 capsule (36,000 Units total) as needed (with snacks). 07/08/22   Ripley Fraise, MD  loperamide (IMODIUM) 2 MG capsule Take 1 capsule (2 mg total) by mouth 4 (four) times daily as needed for diarrhea or loose stools. 02/17/22   Tegeler, Gwenyth Allegra, MD  magnesium oxide (MAG-OX) 400 (240 Mg) MG tablet Take 1 tablet (400 mg total) by mouth daily. 11/30/21   Davonna Belling, MD  metoprolol succinate (TOPROL XL) 25 MG 24 hr tablet Take 1 tablet (25 mg total) by mouth as needed (for palpitations). To not exceed 1 dose per day. Patient taking differently: Take 50 mg by mouth daily. 01/06/22   Duke, Tami Lin, PA  metoprolol succinate (TOPROL-XL) 200 MG 24 hr tablet Take 1 tablet (200 mg total) by mouth daily. Take with or immediately following a meal. 02/14/22   Minus Breeding, MD  Multiple Vitamin (MULTIVITAMIN WITH MINERALS) TABS tablet Take 1 tablet by mouth daily. 09/06/17   Bonnell Public, MD  nicotine (NICODERM CQ - DOSED IN MG/24 HOURS) 21 mg/24hr patch Place 1 patch (21 mg total) onto the skin at bedtime. 12/24/21   [name], MD  nicotine polacrilex (NICORETTE STARTER KIT) 4 MG gum Take 1 each (4 mg total) by mouth as needed for smoking cessation. 03/15/22   [name], [name], MD  nitroGLYCERIN (NITROSTAT) 0.4 MG SL tablet Place 1 tablet (0.4 mg total) under the tongue every 5 (five) minutes as needed for chest pain. Do not exceed 3 doses in a row. 01/06/22   Duke, Tami Lin, PA  ondansetron (ZOFRAN) 4 MG tablet Take 1 tablet (4 mg total) by mouth every 8 (eight) hours as needed for nausea or vomiting. 05/18/22   [name], [name], MD  oxyCODONE (ROXICODONE) 5 MG immediate release  tablet Take 1 tablet (5 mg total) by mouth every 4 (four) hours as needed for severe pain. 02/17/22   Tegeler, Gwenyth Allegra, MD  oxyCODONE-acetaminophen (PERCOCET/ROXICET) 5-325 MG tablet Take 1 tablet by mouth every 6 (six) hours as needed for severe pain. 03/14/22   Long, Wonda Olds, MD  pantoprazole (PROTONIX) 40 MG tablet Take 1 tablet (40 mg total) by mouth 2 (two) times daily. 06/27/22   Tedd Sias, PA  Polyethyl Glycol-Propyl Glycol (SYSTANE OP) Place 1 drop into both eyes 4 (four) times daily as needed (for dryness).  [provider]  sucralfate (CARAFATE) 1 GM/10ML suspension Take 10 mLs (1 g total) by mouth 4 (four) times daily -  with meals and at bedtime. 07/08/22   Ripley Fraise, MD  thiamine 100 MG tablet Take 1 tablet (100 mg total) by mouth daily. 10/16/18   [name], MD  Vitamin D, Ergocalciferol, (DRISDOL) 1.25 MG (50000 UNIT) CAPS capsule Take 50,000 Units by mouth every Wednesday.    [provider]  amitriptyline (ELAVIL) 25 MG tablet Take 1 tablet (25 mg total) by mouth at bedtime. Patient not taking: Reported on 08/08/2019 10/15/18 08/08/19  [name], MD    Physical Exam: BP (!) 136/90   Pulse (!) 107   Temp 97.8 F (36.6 C) (Oral)   Resp 17   SpO2 96%   General: 52 y.o. year-old male well developed well nourished in no acute distress.  Alert and oriented x3. Cardiovascular: Regular rate and rhythm with no rubs or gallops.  No thyromegaly or JVD noted.  No lower extremity edema. 2/4 pulses in all 4 extremities. Respiratory: Clear to auscultation with no wheezes or rales. Good inspiratory effort. Abdomen: Soft nontender nondistended with normal bowel sounds x4 quadrants. Muskuloskeletal: No cyanosis, clubbing or edema noted bilaterally Neuro: CN II-XII intact, strength, sensation, reflexes Skin: No ulcerative lesions noted or rashes Psychiatry: Judgement and insight appear normal. Mood is appropriate for condition and setting           Labs on Admission:  Basic Metabolic Panel: Recent Labs  Lab 07/19/22 2010 07/20/22 0324 07/20/22 0408  NA 136 141 142  K 3.6 3.4* 3.6  CL 103 115*  --   CO2 10* 12*  --   GLUCOSE 172* 72  --   BUN <5* <5*  --   CREATININE 1.27* 0.76  --   CALCIUM 5.9* 5.6*  --    Liver Function Tests: Recent Labs  Lab 07/19/22 2010  AST 175*  ALT 56*  ALKPHOS 96  BILITOT 0.4  PROT 7.2  ALBUMIN 3.9   Recent Labs  Lab 07/19/22 2010  LIPASE 27   No results for input(s): "AMMONIA" in the last 168 hours. CBC: Recent Labs  Lab 07/19/22 2010 07/20/22 0408  WBC 8.3  --   NEUTROABS 4.9  --   HGB 10.7* 12.2*  HCT 33.0* 36.0*  MCV 97.1  --   PLT 132*  --    Cardiac Enzymes: Recent Labs  Lab 07/19/22 2010  CKTOTAL 634*    BNP (last 3 results) Recent Labs    08/17/21 0317 08/18/21 0257 01/20/22 1700  BNP 547.5* 1,104.6* 116.7*    ProBNP (last 3 results) No results for input(s): "PROBNP" in the last 8760 hours.  CBG: No results for input(s): "GLUCAP" in the last 168 hours.  Radiological Exams on Admission: CT Head Wo Contrast  Result Date: 07/19/2022 CLINICAL DATA:  Seizure, head trauma EXAM: CT HEAD WITHOUT CONTRAST TECHNIQUE: Contiguous axial images were obtained from the base of the skull through the vertex without intravenous contrast. RADIATION DOSE REDUCTION: This exam was performed according to the departmental dose-optimization program which includes automated exposure control, adjustment of the mA and/or kV according to patient size and/or use of iterative reconstruction technique. COMPARISON:  05/12/2021 FINDINGS: Brain: No acute infarct or hemorrhage. Lateral ventricles and midline structures are unremarkable. No acute extra-axial fluid collections. No mass effect. Vascular: No hyperdense vessel or unexpected calcification. Skull: Normal. Negative for fracture or focal lesion. Sinuses/Orbits: Chronic posttraumatic and postsurgical changes  of the inferior and medial  left orbital wall unchanged. Paranasal sinuses are clear. Other: None. IMPRESSION: 1. Stable head CT, no acute process. Electronically Signed   By: Randa Ngo M.D.   On: 07/19/2022 22:37   CT ABDOMEN PELVIS WO CONTRAST  Result Date: 07/19/2022 CLINICAL DATA:  Epigastric pain. Patient arrives to ED for seizure activity. Patient postictal and altered on scene per EMS. EXAM: CT ABDOMEN AND PELVIS WITHOUT CONTRAST TECHNIQUE: Multidetector CT imaging of the abdomen and pelvis was performed following the standard protocol without IV contrast. RADIATION DOSE REDUCTION: This exam was performed according to the departmental dose-optimization program which includes automated exposure control, adjustment of the mA and/or kV according to patient size and/or use of iterative reconstruction technique. COMPARISON:  CT 05/28/2022 FINDINGS: Lower chest: No acute abnormality. Hepatobiliary: Unremarkable liver, gallbladder, and biliary tree. Pancreas: Coarse calcifications in the pancreatic head and body compatible with sequela of chronic pancreatitis. Low-density fluid collection in the pancreatic tail measuring 2.2 cm is likely a pseudocyst. Additional pseudocyst in the distal pancreatic tail measuring 1.1 cm. Spleen: Unremarkable. Adrenals/Urinary Tract: Normal adrenal glands. No urinary calculi or hydronephrosis. Unremarkable bladder. Stomach/Bowel: Normal caliber large and small bowel. No bowel wall thickening. Unremarkable stomach. Normal appendix. Vascular/Lymphatic: Aortic atherosclerosis. No enlarged abdominal or pelvic lymph nodes. Reproductive: Unremarkable. Other: No free intraperitoneal air.  No abdominal wall hernia. Musculoskeletal: No acute or significant osseous findings. IMPRESSION: 1. No acute abnormality identified in the abdomen or pelvis. 2. 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 Aortic Atherosclerosis (ICD10-I70.0). Electronically Signed   By: Placido Sou  M.D.   On: 07/19/2022 22:07   DG Chest 1 View  Result Date: 07/19/2022 CLINICAL DATA:  Seizures, Tachy EXAM: CHEST  1 VIEW COMPARISON:  07/08/2022 FINDINGS: Lungs are clear. Heart size and mediastinal contours are within normal limits. No effusion. Visualized bones unremarkable. IMPRESSION: No acute cardiopulmonary disease. Electronically Signed   By: Lucrezia Europe M.D.   On: 07/19/2022 20:14    EKG: I independently viewed the EKG done and my findings are as followed: Atrial fibrillation with RVR 172.  Nonspecific ST-T changes.  QTc 462.  Assessment/Plan Present on Admission: **None**  Principal Problem:   Seizure (HCC)  Seizure, suspected from alcohol withdrawal CT head was nonacute EEG Seizure precautions Telemetry monitoring  Paroxysmal A-fib with RVR Not anticoagulated Resume home regimen Closely monitor on telemetry  High anion gap metabolic acidosis Serum bicarb 10, anion gap 23, pH 7.257 on VBG. 2 A of bicarb IV fluid hydration Repeat renal panel in the morning.  Hypocalcemia Corrected calcium for albumin 6.0 Repleted with 3 g of IV calcium gluconate  Hypokalemia Serum potassium 3.4 Repleted orally with 40 mill equivalent p.o. KCl x 2 doses. IV magnesium 2 g Obtain magnesium level  Alcohol abuse with concern for alcohol withdrawal Endorses last alcohol intake 5 days ago. CIWA protocol in place Continue multivitamins, thiamine and folic acid supplement.  Elevated liver chemistries in the setting of alcohol abuse Alkaline phosphatase and AST elevated Avoid hepatotoxic agents Monitor Repeat CMP  Chronic pancreatitis Resume home regimen As needed analgesics  Chronic anxiety/depression Resume home regimen   Critical care time: 65 minutes.   DVT prophylaxis: Subcu Lovenox daily  Code Status: Full code  Family Communication: None at bedside.  Disposition Plan: Admitted to progressive care unit.  Consults called: None.  Admission status: Inpatient  status.   Status is: Inpatient The patient receives at least 2 midnights for further evaluation  and treatment of present condition.   Kayleen Memos MD [facility] Pager [phone]  If 7PM-7AM, please contact night-coverage [email or web address] Password [email or web address]  07/20/2022, 5:00 AM

## 2022-07-20 NOTE — ED Notes (Signed)
Pt was cleaned and linen changed, due to incontinent of urine. Patient took medication with no issue. Soft restraint removed at this time.

## 2022-07-20 NOTE — ED Notes (Signed)
[name] (mother) would like an update. She says she has called 3 times.

## 2022-07-20 NOTE — Progress Notes (Signed)
EEG complete - results pending 

## 2022-07-20 NOTE — Progress Notes (Signed)
Called to check on patient this morning. RN would like to get patient settled before we begin study. Will try back in one hour for EEG.

## 2022-07-20 NOTE — ED Notes (Signed)
Pt continuing to have a fib RVR with a HR between 110-180s. MD Irene Pap notified and updated on pt's status verbally on phone by Selinda Flavin, PM.

## 2022-07-20 NOTE — Procedures (Signed)
Patient Name: [name]  MRN: [id number]  Epilepsy Attending: Lora Havens  Referring Physician/Provider: Kayleen Memos, DO  Date: 313/2024 Duration: 24.36 mins  Patient history: 52yo M with seizure in setting of alcohol withdrawal. EEG to evaluate for seizure  Level of alertness: Awake  AEDs during EEG study: Ativan  Technical aspects: This EEG study was done with scalp electrodes positioned according to the 10-20 International system of electrode placement. Electrical activity was reviewed with band pass filter of 1-'70Hz'$ , sensitivity of 7 uV/mm, display speed of 52m/sec with a '60Hz'$  notched filter applied as appropriate. EEG data were recorded continuously and digitally stored.  Video monitoring was available and reviewed as appropriate.  Description: The posterior dominant rhythm consists of 9 Hz activity of moderate voltage (25-35 uV) seen predominantly in posterior head regions, symmetric and reactive to eye opening and eye closing. There is an excessive amount of 15 to 18 Hz beta activity distributed symmetrically and diffusely. Physiologic photic driving was seen during photic stimulation.  Hyperventilation was not performed.     ABNORMALITY - Excessive beta, generalized  IMPRESSION: This study is within normal limits. The excessive beta activity seen in the background is most likely due to the effect of benzodiazepine and is a benign EEG pattern. No seizures or epileptiform discharges were seen throughout the recording.  [name] OBarbra Sarks

## 2022-07-20 NOTE — ED Provider Notes (Signed)
I assumed care in signout from Dr. Ursula Beath.  Patient with extensive history, presented tonight for possible seizure.  Possible withdrawal seizure versus substance induced seizure.  Patient is now resting comfortably in no acute distress.  He is still mildly tachycardic despite IV fluids and Lopressor given by Dr. Pearline Cables.  Patient will be admitted for further evaluation.  He has had previous electroencephalograms previously. He was also found to have hypocalcemia and is already received a dose of calcium  Endorsed to Dr. [place] Crane with [facility] for admission.  Will recheck electrolytes as well as a venous blood gas.  At patient request, I spoke to his mother [place].  She was updated on plan   Ripley Fraise, MD 07/20/22 [phone]

## 2022-07-20 NOTE — Progress Notes (Addendum)
PROGRESS NOTE        PATIENT DETAILS Name: [name] Age: 52 y.o. Sex: male Date of Birth: 11-04-1970 Admit Date: 07/19/2022 Admitting Physician Kayleen Memos, DO PCP:Pcp, No  Brief Summary: Patient is a 52 y.o.  male history of PAF, history of WPW syndrome s/p ablation, EtOH use, chronic pancreatitis secondary to EtOH use, bipolar disorder, chronic anxiety/depression, cocaine use-who presented to the ED with seizures.  Significant events: 3/12>> admitted to [facility]-brought in by EMS for seizures. 3/13>>Afib RVR-BP soft-amiodarone gtt-cards consulted  Significant studies: 3/12>> CXR: No PNA 3/12>> CT head: No acute process 3/12>> CT abdomen/pelvis: No acute abnormality in the abdomen/pelvis.  Sequelae of chronic pancreatitis with unchanged pseudocyst. 3/13>> EEG: No seizures  Significant microbiology data: 3/12>> COVID/influenza/RSV PCR: Negative  Procedures: None  Consults: Cardiology  Subjective: Lying comfortably in bed-denies any chest pain or shortness of breath.  Has some chronic upper abdominal pain that is unchanged.  Denies any excessive diarrhea over the past several days.  Objective: Vitals: Blood pressure (!) 161/114, pulse (!) 179, temperature (!) 97.4 F (36.3 C), temperature source Axillary, resp. rate 20, SpO2 100 %.   Exam: Gen Exam:Alert awake-not in any distress HEENT:atraumatic, normocephalic Chest: B/L clear to auscultation anteriorly CVS:S1S2 regular Abdomen:soft non tender, non distended Extremities:no edema Neurology: Non focal Skin: no rash  Pertinent Labs/Radiology:    Latest Ref Rng & Units 07/20/2022    5:15 AM 07/20/2022    4:08 AM 07/19/2022    8:10 PM  CBC  WBC 4.0 - 10.5 K/uL 11.1   8.3   Hemoglobin 13.0 - 17.0 g/dL 12.0  12.2  10.7   Hematocrit 39.0 - 52.0 % 37.5  36.0  33.0   Platelets 150 - 400 K/uL 124   132     Lab Results  Component Value Date   NA 142 07/20/2022   K 3.6 07/20/2022    CL 115 (H) 07/20/2022   CO2 12 (L) 07/20/2022      Assessment/Plan: Seizure disorder In the setting of alcohol use/cocaine use Poor historian but claims last drink was around 3-5 days back Continue Ativan per CIWA protocol CT head unremarkable-EEG negative for seizures-no indication for AEDs at this point  A-fib RVR Rate remains uncontrolled despite being started on amiodarone infusion this morning Start scheduled beta-blockers Await input from cardiology.   Hypokalemia Due to EtOH use and severe hypomagnesemia Repleted  Hypomagnesemia Likely due to EtOH use 4 g of IV magnesium sulfate given additionally this morning Repeat electrolytes later this afternoon  Hypocalcemia Likely due to hypomagnesemia-but will check PTH/vitamin D levels Received IV calcium gluconate earlier this morning, will repeat 2 g IV this afternoon Will start oral calcitriol and Os-Cal Repeat electrolytes later this afternoon  Non anion gap metabolic acidosis Suspect this is from intermittent diarrhea in the setting of chronic pancreatitis Start IVF with bicarb Follow electrolytes  Alcoholic hepatitis Mild Follow LFTs  Rhabdomyolysis Mild-likely in the setting of seizures Follow CK-on IVF  History of WPW syndrome s/p ablation Telemetry monitoring  Chronic pancreatitis Chronic pain syndrome Has chronic abdominal pain Continue Creon Continue as needed narcotics  Anxiety/depression Relatively stable Continue Cymbalta  Cocaine use/EtOH use Poor historian but acknowledges cocaine use approximately 2 days back, EtOH use-last drink around 3-5 days back No signs of withdrawal Ativan per CIWA protocol  Underweight: Estimated body mass  index is 19.94 kg/m as calculated from the following:   Height as of 07/08/22: '5\' 9"'$  (1.753 m).   Weight as of 07/08/22: 61.2 kg.   Code status:   Code Status: Full Code   DVT Prophylaxis: enoxaparin (LOVENOX) injection 40 mg Start: 07/20/22 1000    Family Communication: None at bedside   Disposition Plan: Status is: Inpatient Remains inpatient appropriate because: Severity of illness   Planned Discharge Destination:Home   Diet: Diet Order             Diet Heart Room service appropriate? Yes; Fluid consistency: Thin  Diet effective now                     Antimicrobial agents: Anti-infectives (From admission, onward)    None        MEDICATIONS: Scheduled Meds:  calcitRIOL  0.5 mcg Oral Daily   calcium-vitamin D  1 tablet Oral BID   DULoxetine  60 mg Oral QHS   enoxaparin (LOVENOX) injection  40 mg Subcutaneous Daily   folic acid  1 mg Oral Daily   lipase/protease/amylase  36,000 Units Oral TID AC   metoprolol tartrate  25 mg Oral BID   multivitamin with minerals  1 tablet Oral Daily   pantoprazole  40 mg Oral BID   potassium chloride  40 mEq Oral BID WC   sucralfate  1 g Oral TID WC & HS   thiamine  100 mg Oral Daily   Or   thiamine  100 mg Intravenous Daily   Continuous Infusions:  amiodarone 30 mg/hr (07/20/22 [id number])   calcium gluconate     magnesium sulfate bolus IVPB 4 g (07/20/22 0821)   sodium bicarbonate 150 mEq in dextrose 5 % 1,150 mL infusion 75 mL/hr at 07/20/22 0846   PRN Meds:.albuterol, LORazepam, LORazepam, melatonin, polyethylene glycol, prochlorperazine   I have personally reviewed following labs and imaging studies  LABORATORY DATA: CBC: Recent Labs  Lab 07/19/22 2010 07/20/22 0408 07/20/22 0515  WBC 8.3  --  11.1*  NEUTROABS 4.9  --   --   HGB 10.7* 12.2* 12.0*  HCT 33.0* 36.0* 37.5*  MCV 97.1  --  97.2  PLT 132*  --  124*    Basic Metabolic Panel: Recent Labs  Lab 07/19/22 2010 07/20/22 0324 07/20/22 0408 07/20/22 0515 07/20/22 0822  NA 136 141 142  --   --   K 3.6 3.4* 3.6  --   --   CL 103 115*  --   --   --   CO2 10* 12*  --   --   --   GLUCOSE 172* 72  --   --   --   BUN <5* <5*  --   --   --   CREATININE 1.27* 0.76  --   --   --   CALCIUM 5.9*  5.6*  --   --   --   MG  --   --   --  0.8*  --   PHOS  --   --   --   --  4.1    GFR: Estimated Creatinine Clearance: 92.4 mL/min (by C-G formula based on SCr of 0.76 mg/dL).  Liver Function Tests: Recent Labs  Lab 07/19/22 2010  AST 175*  ALT 56*  ALKPHOS 96  BILITOT 0.4  PROT 7.2  ALBUMIN 3.9   Recent Labs  Lab 07/19/22 2010  LIPASE 27   No results for input(s): "AMMONIA" in  the last 168 hours.  Coagulation Profile: No results for input(s): "INR", "PROTIME" in the last 168 hours.  Cardiac Enzymes: Recent Labs  Lab 07/19/22 2010  CKTOTAL 634*    BNP (last 3 results) No results for input(s): "PROBNP" in the last 8760 hours.  Lipid Profile: No results for input(s): "CHOL", "HDL", "LDLCALC", "TRIG", "CHOLHDL", "LDLDIRECT" in the last 72 hours.  Thyroid Function Tests: Recent Labs    07/19/22 2010  TSH 2.004  FREET4 0.59*    Anemia Panel: No results for input(s): "VITAMINB12", "FOLATE", "FERRITIN", "TIBC", "IRON", "RETICCTPCT" in the last 72 hours.  Urine analysis:    Component Value Date/Time   COLORURINE COLORLESS (A) 05/28/2022 0343   APPEARANCEUR CLEAR 05/28/2022 0343   LABSPEC 1.005 05/28/2022 0343   PHURINE 5.0 05/28/2022 0343   GLUCOSEU NEGATIVE 05/28/2022 0343   HGBUR NEGATIVE 05/28/2022 0343   HGBUR negative 04/30/2010 1020   BILIRUBINUR NEGATIVE 05/28/2022 0343   BILIRUBINUR negative 09/06/2019 1649   KETONESUR NEGATIVE 05/28/2022 0343   PROTEINUR NEGATIVE 05/28/2022 0343   UROBILINOGEN 0.2 09/06/2019 1649   UROBILINOGEN 0.2 03/15/2015 0508   NITRITE NEGATIVE 05/28/2022 0343   LEUKOCYTESUR NEGATIVE 05/28/2022 0343    Sepsis Labs: Lactic Acid, Venous    Component Value Date/Time   LATICACIDVEN 1.8 07/20/2022 [id number]    MICROBIOLOGY: Recent Results (from the past 240 hour(s))  Resp panel by RT-PCR (RSV, Flu A&B, Covid) Anterior Nasal Swab     Status: None   Collection Time: 07/19/22  8:18 PM   Specimen: Anterior Nasal Swab   Result Value Ref Range Status   SARS Coronavirus 2 by RT PCR NEGATIVE NEGATIVE Final   Influenza A by PCR NEGATIVE NEGATIVE Final   Influenza B by PCR NEGATIVE NEGATIVE Final    Comment: (NOTE) The Xpert Xpress SARS-CoV-2/FLU/RSV plus assay is intended as an aid in the diagnosis of influenza from Nasopharyngeal swab specimens and should not be used as a sole basis for treatment. Nasal washings and aspirates are unacceptable for Xpert Xpress SARS-CoV-2/FLU/RSV testing.  Fact Sheet for Patients: EntrepreneurPulse.com.au  Fact Sheet for Healthcare Providers: IncredibleEmployment.be  This test is not yet approved or cleared by the [place] FDA and has been authorized for detection and/or diagnosis of SARS-CoV-2 by FDA under an Emergency Use Authorization (EUA). This EUA will remain in effect (meaning this test can be used) for the duration of the COVID-19 declaration under Section 564(b)(1) of the Act, 21 U.S.C. section 360bbb-3(b)(1), unless the authorization is terminated or revoked.     Resp Syncytial Virus by PCR NEGATIVE NEGATIVE Final    Comment: (NOTE) Fact Sheet for Patients: EntrepreneurPulse.com.au  Fact Sheet for Healthcare Providers: IncredibleEmployment.be  This test is not yet approved or cleared by the [place] FDA and has been authorized for detection and/or diagnosis of SARS-CoV-2 by FDA under an Emergency Use Authorization (EUA). This EUA will remain in effect (meaning this test can be used) for the duration of the COVID-19 declaration under Section 564(b)(1) of the Act, 21 U.S.C. section 360bbb-3(b)(1), unless the authorization is terminated or revoked.  Performed at [facility], [place] [address], [place], [place] [address]     RADIOLOGY STUDIES/RESULTS: EEG adult  Result Date: 07/20/2022 Lora Havens, MD     07/20/2022 10:11 AM Patient Name: [name] MRN: [id number] Epilepsy Attending: Lora Havens Referring Physician/Provider: Kayleen Memos, DO Date: 313/2024 Duration: 24.36 mins Patient history: 52yo M with seizure in setting of alcohol withdrawal. EEG to  evaluate for seizure Level of alertness: Awake AEDs during EEG study: Ativan Technical aspects: This EEG study was done with scalp electrodes positioned according to the 10-20 International system of electrode placement. Electrical activity was reviewed with band pass filter of 1-'70Hz'$ , sensitivity of 7 uV/mm, display speed of 76m/sec with a '60Hz'$  notched filter applied as appropriate. EEG data were recorded continuously and digitally stored.  Video monitoring was available and reviewed as appropriate. Description: The posterior dominant rhythm consists of 9 Hz activity of moderate voltage (25-35 uV) seen predominantly in posterior head regions, symmetric and reactive to eye opening and eye closing. There is an excessive amount of 15 to 18 Hz beta activity distributed symmetrically and diffusely. Physiologic photic driving was seen during photic stimulation.  Hyperventilation was not performed.   ABNORMALITY - Excessive beta, generalized IMPRESSION: This study is within normal limits. The excessive beta activity seen in the background is most likely due to the effect of benzodiazepine and is a benign EEG pattern. No seizures or epileptiform discharges were seen throughout the recording. PLora Havens  CT Head Wo Contrast  Result Date: 07/19/2022 CLINICAL DATA:  Seizure, head trauma EXAM: CT HEAD WITHOUT CONTRAST TECHNIQUE: Contiguous axial images were obtained from the base of the skull through the vertex without intravenous contrast. RADIATION DOSE REDUCTION: This exam was performed according to the departmental dose-optimization program which includes automated exposure control, adjustment of the mA and/or kV according to patient size and/or use of iterative reconstruction technique.  COMPARISON:  05/12/2021 FINDINGS: Brain: No acute infarct or hemorrhage. Lateral ventricles and midline structures are unremarkable. No acute extra-axial fluid collections. No mass effect. Vascular: No hyperdense vessel or unexpected calcification. Skull: Normal. Negative for fracture or focal lesion. Sinuses/Orbits: Chronic posttraumatic and postsurgical changes of the inferior and medial left orbital wall unchanged. Paranasal sinuses are clear. Other: None. IMPRESSION: 1. Stable head CT, no acute process. Electronically Signed   By: MRanda NgoM.D.   On: 07/19/2022 22:37   CT ABDOMEN PELVIS WO CONTRAST  Result Date: 07/19/2022 CLINICAL DATA:  Epigastric pain. Patient arrives to ED for seizure activity. Patient postictal and altered on scene per EMS. EXAM: CT ABDOMEN AND PELVIS WITHOUT CONTRAST TECHNIQUE: Multidetector CT imaging of the abdomen and pelvis was performed following the standard protocol without IV contrast. RADIATION DOSE REDUCTION: This exam was performed according to the departmental dose-optimization program which includes automated exposure control, adjustment of the mA and/or kV according to patient size and/or use of iterative reconstruction technique. COMPARISON:  CT 05/28/2022 FINDINGS: Lower chest: No acute abnormality. Hepatobiliary: Unremarkable liver, gallbladder, and biliary tree. Pancreas: Coarse calcifications in the pancreatic head and body compatible with sequela of chronic pancreatitis. Low-density fluid collection in the pancreatic tail measuring 2.2 cm is likely a pseudocyst. Additional pseudocyst in the distal pancreatic tail measuring 1.1 cm. Spleen: Unremarkable. Adrenals/Urinary Tract: Normal adrenal glands. No urinary calculi or hydronephrosis. Unremarkable bladder. Stomach/Bowel: Normal caliber large and small bowel. No bowel wall thickening. Unremarkable stomach. Normal appendix. Vascular/Lymphatic: Aortic atherosclerosis. No enlarged abdominal or pelvic lymph nodes.  Reproductive: Unremarkable. Other: No free intraperitoneal air.  No abdominal wall hernia. Musculoskeletal: No acute or significant osseous findings. IMPRESSION: 1. No acute abnormality identified in the abdomen or pelvis. 2. 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 Aortic Atherosclerosis (ICD10-I70.0). Electronically Signed   By: TPlacido SouM.D.   On: 07/19/2022 22:07   DG Chest 1 View  Result Date: 07/19/2022 CLINICAL DATA:  Seizures,  Tachy EXAM: CHEST  1 VIEW COMPARISON:  07/08/2022 FINDINGS: Lungs are clear. Heart size and mediastinal contours are within normal limits. No effusion. Visualized bones unremarkable. IMPRESSION: No acute cardiopulmonary disease. Electronically Signed   By: Lucrezia Europe M.D.   On: 07/19/2022 20:14     LOS: 0 days   Oren Binet, MD  [facility]    To contact the attending provider between 7A-7P or the covering provider during after hours 7P-7A, please log into the web site [email or web address] and access using universal [place] password for that web site. If you do not have the password, please call the hospital operator.  07/20/2022, 10:12 AM

## 2022-07-20 NOTE — ED Notes (Signed)
Date and time results received: 07/20/22 0610 (use smartphrase ".now" to insert current time)  Test: magnesium Critical Value: 0.8  Name of Provider Notified: Irene Pap  Orders Received? Or Actions Taken?:  notified

## 2022-07-20 NOTE — Discharge Instructions (Signed)
Per  DMV statutes, patients with seizures are not allowed to drive until they have been seizure-free for six months.  Other recommendations include using caution when using heavy equipment or power tools. Avoid working on ladders or at heights. Take showers instead of baths.  Do not swim alone.  Ensure the water temperature is not too high on the home water heater. Do not go swimming alone. Do not lock yourself in a room alone (i.e. bathroom). When caring for infants or small children, sit down when holding, feeding, or changing them to minimize risk of injury to the child in the event you have a seizure. Maintain good sleep hygiene. Avoid alcohol.  Also recommend adequate sleep, hydration, good diet and minimize stress.     During the Seizure   - First, ensure adequate ventilation and place patients on the floor on their left side  Loosen clothing around the neck and ensure the airway is patent. If the patient is clenching the teeth, do not force the mouth open with any object as this can cause severe damage - Remove all items from the surrounding that can be hazardous. The patient may be oblivious to what's happening and may not even know what he or she is doing. If the patient is confused and wandering, either gently guide him/her away and block access to outside areas - Reassure the individual and be comforting - Call 911. In most cases, the seizure ends before EMS arrives. However, there are cases when seizures may last over 3 to 5 minutes. Or the individual may have developed breathing difficulties or severe injuries. If a pregnant patient or a person with diabetes develops a seizure, it is prudent to call an ambulance. - Finally, if the patient does not regain full consciousness, then call EMS. Most patients will remain confused for about 45 to 90 minutes after a seizure, so you must use judgment in calling for help. - Avoid restraints but make sure the patient is in a bed with  padded side rails - Place the individual in a lateral position with the neck slightly flexed; this will help the saliva drain from the mouth and prevent the tongue from falling backward - Remove all nearby furniture and other hazards from the area - Provide verbal assurance as the individual is regaining consciousness - Provide the patient with privacy if possible - Call for help and start treatment as ordered by the caregiver   After the Seizure (Postictal Stage)   After a seizure, most patients experience confusion, fatigue, muscle pain and/or a headache. Thus, one should permit the individual to sleep. For the next few days, reassurance is essential. Being calm and helping reorient the person is also of importance.   Most seizures are painless and end spontaneously. Seizures are not harmful to others but can lead to complications such as stress on the lungs, brain and the heart. Individuals with prior lung problems may develop labored breathing and respiratory distress.    

## 2022-07-20 NOTE — ED Notes (Signed)
Patient continuously trying to get up out of bed without assistance despite being told multiple times throughout the day that he needs to use his call light and request assistance if he needs to get up. Patient states that he understand that he needs to use the call light and wait for assistance each time that it is explained, however patient continues to be unable to follow directions. Patient provided with urinal, bedside commode, and call light is in reach. Patient informed that he needs to remain monitored at this time due to high heart rate and abnormal rhythm. Patient states that he does not want to use the bedside commode and wants to get up to the bathroom. Patient informed again that due to the critical nature of the medications and need for monitoring that he cannot leave the room and be off the cardiac monitor. Patient stated understanding and was re-oriented to room and call light. Patient provided with graham crackers and juice per request.

## 2022-07-20 NOTE — ED Notes (Signed)
ED TO INPATIENT HANDOFF REPORT  ED Nurse Name and Phone #: Mechele Claude, [place]  S Name/Age/Gender [place] 52 y.o. male Room/Bed: 034C/034C  Code Status   Code Status: Full Code  Home/SNF/Other Home Patient oriented to: self, place, time, and situation Is this baseline? Yes   Triage Complete: Triage complete  Chief Complaint Seizure [facility]) [R56.9]  Triage Note Pt arrives to ED from friends house for seizure like activity. Pt postictal and altered on scene per EMS. Pt was given '5mg'$  of versed because of combative behavior. CBG 187. Unknown if pt hit ground and unknown time. No blood thinners reported. Pt with hx of WPW    Allergies Allergies  Allergen Reactions   Robaxin [Methocarbamol] Other (See Comments)    jumpy limbs   Aspirin Other (See Comments)    Unknown reaction   Shellfish-Derived Products Nausea And Vomiting and Rash   Trazodone And Nefazodone Other (See Comments)    Muscle spasms   Adhesive [Tape] Itching   Latex Itching   Toradol [Ketorolac Tromethamine] Other (See Comments)    Has ulcers; cannot have this   Contrast Media [Iodinated Contrast Media] Hives   Reglan [Metoclopramide] Other (See Comments)    Muscle spasms   Salmon [Fish Oil] Nausea And Vomiting and Rash    Level of Care/Admitting Diagnosis ED Disposition     ED Disposition  Admit   Condition  --   [place]: [facility] [[id number]]  Level of Care: Progressive [102]  Admit to Progressive based on following criteria: NEUROLOGICAL AND NEUROSURGICAL complex patients with significant risk of instability, who do not meet ICU criteria, yet require close observation or frequent assessment (< / = every 2 - 4 hours) with medical / nursing intervention.  May admit patient to [facility] or [facility] if equivalent level of care is available:: Yes  Covid Evaluation: Asymptomatic - no recent exposure (last 10 days) testing not required  Diagnosis: Seizure  [facility]) [id number]  Admitting Physician: Kayleen Memos [id number]  Attending Physician: Kayleen Memos [id number]  Certification:: I certify this patient will need inpatient services for at least 2 midnights  Estimated Length of Stay: 2          B Medical/Surgery History Past Medical History:  Diagnosis Date   Alcoholism /alcohol abuse    Anemia    Anxiety    Arthritis    knees; arms; elbows (03/26/2015)   Asthma    Bipolar disorder ([place])    Chronic bronchitis ([place])    Chronic lower back pain    Chronic pancreatitis ([place])    Cocaine abuse ([place])    Depression    Family history of adverse reaction to anesthesia    Femoral condyle fracture ([place]) 03/08/2014   left medial/notes 03/09/2014   GERD (gastroesophageal reflux disease)    H/O hiatal hernia    H/O suicide attempt 10/2012   High cholesterol    History of blood transfusion 10/2012   when I tried to commit suicide   History of stomach ulcers    Hypertension    Marijuana abuse, continuous    Migraine    a few times/year (03/26/2015)   Pneumonia 1990's X 3   PTSD (post-traumatic stress disorder)    Sickle cell trait ([place])    WPW (Wolff-Parkinson-White syndrome)    [name] 03/06/2013   Past Surgical History:  Procedure Laterality Date   BIOPSY  11/25/2017   Procedure: BIOPSY;  Surgeon: Arta Silence, MD;  Location: [place] ENDOSCOPY;  Service: Endoscopy;;   BIOPSY  10/14/2018   Procedure: BIOPSY;  Surgeon: Arta Silence, MD;  Location: [facility];  Service: Endoscopy;;   CARDIAC CATHETERIZATION     CYST ENTEROSTOMY  01/02/2020   Procedure: CYST ASPIRATION;  Surgeon: Milus Banister, MD;  Location: [facility]OS[facility];  Service: Endoscopy;;   ESOPHAGOGASTRODUODENOSCOPY (EGD) WITH PROPOFOL N/A 11/25/2017   Procedure: ESOPHAGOGASTRODUODENOSCOPY (EGD) WITH PROPOFOL;  Surgeon: Arta Silence, MD;  Location: [facility];  Service: Endoscopy;  Laterality: N/A;   ESOPHAGOGASTRODUODENOSCOPY (EGD) WITH PROPOFOL Left 10/14/2018    Procedure: ESOPHAGOGASTRODUODENOSCOPY (EGD) WITH PROPOFOL;  Surgeon: Arta Silence, MD;  Location: [facility] ENDOSCOPY;  Service: Endoscopy;  Laterality: Left;   ESOPHAGOGASTRODUODENOSCOPY (EGD) WITH PROPOFOL N/A 11/14/2018   Procedure: ESOPHAGOGASTRODUODENOSCOPY (EGD) WITH PROPOFOL;  Surgeon: Laurence Spates, MD;  [facility];  Service: Gastroenterology;  Laterality: N/A;   ESOPHAGOGASTRODUODENOSCOPY (EGD) WITH PROPOFOL N/A 01/02/2020   Procedure: ESOPHAGOGASTRODUODENOSCOPY (EGD) WITH PROPOFOL;  Surgeon: Milus Banister, MD;  [facility];  Service: Endoscopy;  Laterality: N/A;   ESOPHAGOGASTRODUODENOSCOPY (EGD) WITH PROPOFOL N/A 10/25/2020   Procedure: ESOPHAGOGASTRODUODENOSCOPY (EGD) WITH PROPOFOL;  Surgeon: Rush Landmark Telford Nab., MD;  Location: [facility];  Service: Gastroenterology;  Laterality: N/A;   EUS N/A 01/02/2020   Procedure: UPPER ENDOSCOPIC ULTRASOUND (EUS) RADIAL;  Surgeon: Milus Banister, MD;  Location: [facility];  Service: Endoscopy;  Laterality: N/A;   EYE SURGERY Left 1990's   "result of trauma"    FACIAL FRACTURE SURGERY Left 1990's   "result of trauma"    FLEXIBLE SIGMOIDOSCOPY N/A 10/25/2020   Procedure: FLEXIBLE SIGMOIDOSCOPY;  Surgeon: Irving Copas., MD;  Location: [place];  Service: Gastroenterology;  Laterality: N/A;   FRACTURE SURGERY     HEMOSTASIS CLIP PLACEMENT  10/25/2020   Procedure: HEMOSTASIS CLIP PLACEMENT;  Surgeon: Irving Copas., MD;  Location: [place];  Service: Gastroenterology;;   HERNIA REPAIR     LEFT HEART CATHETERIZATION WITH CORONARY ANGIOGRAM Right 03/07/2013   Procedure: LEFT HEART CATHETERIZATION WITH CORONARY ANGIOGRAM;  Surgeon: [name], MD;  Location: [place] CATH LAB;  Service: Cardiovascular;  Laterality: Right;   UMBILICAL HERNIA REPAIR     UPPER GASTROINTESTINAL ENDOSCOPY       A IV Location/Drains/Wounds Patient Lines/Drains/Airways Status     Active Line/Drains/Airways     Name  Placement date Placement time Site Days   Peripheral IV 07/20/22 22 G Left;Posterior Hand 07/20/22  0515  Hand  less than 1   Peripheral IV 07/20/22 20 G Posterior;Right Forearm 07/20/22  0523  Forearm  less than 1            Intake/Output Last 24 hours  Intake/Output Summary (Last 24 hours) at 07/20/2022 0525 Last data filed at 07/20/2022 0241 Gross per 24 hour  Intake 3093.76 ml  Output --  Net 3093.76 ml    Labs/Imaging Results for orders placed or performed during the hospital encounter of 07/19/22 (from the past 48 hour(s))  CBC with Differential     Status: Abnormal   Collection Time: 07/19/22  8:10 PM  Result Value Ref Range   WBC 8.3 4.0 - 10.5 K/uL   RBC 3.40 (L) 4.22 - 5.81 MIL/uL   Hemoglobin 10.7 (L) 13.0 - 17.0 g/dL   HCT 33.0 (L) 39.0 - 52.0 %   MCV 97.1 80.0 - 100.0 fL   MCH 31.5 26.0 - 34.0 pg   MCHC 32.4 30.0 - 36.0 g/dL   RDW 16.6 (H) 11.5 - 15.5 %  Platelets 132 (L) 150 - 400 K/uL   nRBC 0.2 0.0 - 0.2 %   Neutrophils Relative % 58 %   Neutro Abs 4.9 1.7 - 7.7 K/uL   Lymphocytes Relative 30 %   Lymphs Abs 2.5 0.7 - 4.0 K/uL   Monocytes Relative 10 %   Monocytes Absolute 0.8 0.1 - 1.0 K/uL   Eosinophils Relative 1 %   Eosinophils Absolute 0.1 0.0 - 0.5 K/uL   Basophils Relative 0 %   Basophils Absolute 0.0 0.0 - 0.1 K/uL   Immature Granulocytes 1 %   Abs Immature Granulocytes 0.12 (H) 0.00 - 0.07 K/uL    Comment: Performed at [facility] [address] [place], [place] [address]  Comprehensive metabolic panel     Status: Abnormal   Collection Time: 07/19/22  8:10 PM  Result Value Ref Range   Sodium 136 135 - 145 mmol/L   Potassium 3.6 3.5 - 5.1 mmol/L   Chloride 103 98 - 111 mmol/L   CO2 10 (L) 22 - 32 mmol/L   Glucose, Bld 172 (H) 70 - 99 mg/dL    Comment: Glucose reference range applies only to samples taken after fasting for at least 8 hours.   BUN <5 (L) 6 - 20 mg/dL   Creatinine, Ser 1.27 (H) 0.61 - 1.24 mg/dL   Calcium 5.9 (LL)  8.9 - 10.3 mg/dL    Comment: CRITICAL RESULT CALLED TO, READ BACK BY AND VERIFIED WITH A.COBB,RN. [id number] 07/19/22. LPAIT   Total Protein 7.2 6.5 - 8.1 g/dL   Albumin 3.9 3.5 - 5.0 g/dL   AST 175 (H) 15 - 41 U/L   ALT 56 (H) 0 - 44 U/L   Alkaline Phosphatase 96 38 - 126 U/L   Total Bilirubin 0.4 0.3 - 1.2 mg/dL   GFR, Estimated >60 >60 mL/min    Comment: (NOTE) Calculated using the CKD-EPI Creatinine Equation (2021)    Anion gap 23 (H) 5 - 15    Comment: ELECTROLYTES REPEATED TO VERIFY Performed at [facility] [address], [place], [place] [address]   Lipase, blood     Status: None   Collection Time: 07/19/22  8:10 PM  Result Value Ref Range   Lipase 27 11 - 51 U/L    Comment: Performed at [facility] [address] [place], [place] [address]  CK     Status: Abnormal   Collection Time: 07/19/22  8:10 PM  Result Value Ref Range   Total CK 634 (H) 49 - 397 U/L    Comment: Performed at [facility], [place] [address], [place], [place] [address]  TSH     Status: None   Collection Time: 07/19/22  8:10 PM  Result Value Ref Range   TSH 2.004 0.350 - 4.500 uIU/mL    Comment: Performed by a 3rd Generation assay with a functional sensitivity of <=0.01 uIU/mL. Performed at [facility], [place] [address], [place], [place] [address]   T4, free     Status: Abnormal   Collection Time: 07/19/22  8:10 PM  Result Value Ref Range   Free T4 0.59 (L) 0.61 - 1.12 ng/dL    Comment: (NOTE) Biotin ingestion may interfere with free T4 tests. If the results are inconsistent with the TSH level, previous test results, or the clinical presentation, then consider biotin interference. If needed, order repeat testing after stopping biotin. Performed at [facility], [place],  [place] [address]   Troponin I (High Sensitivity)     Status: None   Collection Time: 07/19/22  8:10 PM  Result Value Ref Range   Troponin I (High Sensitivity) 15 <18 ng/L    Comment:  (NOTE) Elevated high sensitivity troponin I (hsTnI) values and significant  changes across serial measurements may suggest ACS but many other  chronic and acute conditions are known to elevate hsTnI results.  Refer to the "Links" section for chest pain algorithms and additional  guidance. Performed at [facility], [place] [address], [place], [place] [address]   Ethanol     Status: None   Collection Time: 07/19/22  8:10 PM  Result Value Ref Range   Alcohol, Ethyl (B) <10 <10 mg/dL    Comment: (NOTE) Lowest detectable limit for serum alcohol is 10 mg/dL.  For medical purposes only. Performed at [facility], [place] [address], [place], [place] [address]   Resp panel by RT-PCR (RSV, Flu A&B, Covid) Anterior Nasal Swab     Status: None   Collection Time: 07/19/22  8:18 PM   Specimen: Anterior Nasal Swab  Result Value Ref Range   SARS Coronavirus 2 by RT PCR NEGATIVE NEGATIVE   Influenza A by PCR NEGATIVE NEGATIVE   Influenza B by PCR NEGATIVE NEGATIVE    Comment: (NOTE) The Xpert Xpress SARS-CoV-2/FLU/RSV plus assay is intended as an aid in the diagnosis of influenza from Nasopharyngeal swab specimens and should not be used as a sole basis for treatment. Nasal washings and aspirates are unacceptable for Xpert Xpress SARS-CoV-2/FLU/RSV testing.  Fact Sheet for Patients: EntrepreneurPulse.com.au  Fact Sheet for Healthcare Providers: IncredibleEmployment.be  This test is not yet approved or cleared by the [place] FDA and has been authorized for detection and/or diagnosis of SARS-CoV-2 by FDA under an Emergency Use Authorization (EUA). This EUA will remain in effect (meaning this test can be used) for the duration of the COVID-19 declaration under Section 564(b)(1) of the Act, 21 U.S.C. section 360bbb-3(b)(1), unless the authorization is terminated or revoked.     Resp Syncytial Virus by PCR NEGATIVE NEGATIVE    Comment:  (NOTE) Fact Sheet for Patients: EntrepreneurPulse.com.au  Fact Sheet for Healthcare Providers: IncredibleEmployment.be  This test is not yet approved or cleared by the [place] FDA and has been authorized for detection and/or diagnosis of SARS-CoV-2 by FDA under an Emergency Use Authorization (EUA). This EUA will remain in effect (meaning this test can be used) for the duration of the COVID-19 declaration under Section 564(b)(1) of the Act, 21 U.S.C. section 360bbb-3(b)(1), unless the authorization is terminated or revoked.  Performed at [facility], [place] [address], [place], [place] [address]   Troponin I (High Sensitivity)     Status: Abnormal   Collection Time: 07/19/22 10:10 PM  Result Value Ref Range   Troponin I (High Sensitivity) 20 (H) <18 ng/L    Comment: (NOTE) Elevated high sensitivity troponin I (hsTnI) values and significant  changes across serial measurements may suggest ACS but many other  chronic and acute conditions are known to elevate hsTnI results.  Refer to the "Links" section for chest pain algorithms and additional  guidance. Performed at [facility], [place] [address], [place], [place] [id number]   Basic metabolic panel     Status: Abnormal   Collection Time: 07/20/22  3:24 AM  Result Value Ref Range   Sodium 141 135 - 145 mmol/L   Potassium 3.4 (L) 3.5 -  5.1 mmol/L   Chloride 115 (H) 98 - 111 mmol/L   CO2 12 (L) 22 - 32 mmol/L   Glucose, Bld 72 70 - 99 mg/dL    Comment: Glucose reference range applies only to samples taken after fasting for at least 8 hours.   BUN <5 (L) 6 - 20 mg/dL   Creatinine, Ser 0.76 0.61 - 1.24 mg/dL   Calcium 5.6 (LL) 8.9 - 10.3 mg/dL    Comment: CRITICAL RESULT CALLED TO, READ BACK BY AND VERIFIED WITH A.COBB,RN. [id number] 07/20/22. LPAIT   GFR, Estimated >60 >60 mL/min    Comment: (NOTE) Calculated using the CKD-EPI Creatinine Equation (2021)    Anion gap 14 5 - 15     Comment: Performed at [facility], [place] [address] [place], [place] [address]  I-Stat venous blood gas, ED     Status: Abnormal   Collection Time: 07/20/22  4:08 AM  Result Value Ref Range   pH, Ven 7.257 7.25 - 7.43   pCO2, Ven 26.4 (L) 44 - 60 mmHg   pO2, Ven 187 (H) 32 - 45 mmHg   Bicarbonate 11.7 (L) 20.0 - 28.0 mmol/L   TCO2 13 (L) 22 - 32 mmol/L   O2 Saturation 99 %   Acid-base deficit 14.0 (H) 0.0 - 2.0 mmol/L   Sodium 142 135 - 145 mmol/L   Potassium 3.6 3.5 - 5.1 mmol/L   Calcium, Ion 0.68 (LL) 1.15 - 1.40 mmol/L   HCT 36.0 (L) 39.0 - 52.0 %   Hemoglobin 12.2 (L) 13.0 - 17.0 g/dL   Sample type VENOUS    Comment NOTIFIED PHYSICIAN    CT Head Wo Contrast  Result Date: 07/19/2022 CLINICAL DATA:  Seizure, head trauma EXAM: CT HEAD WITHOUT CONTRAST TECHNIQUE: Contiguous axial images were obtained from the base of the skull through the vertex without intravenous contrast. RADIATION DOSE REDUCTION: This exam was performed according to the departmental dose-optimization program which includes automated exposure control, adjustment of the mA and/or kV according to patient size and/or use of iterative reconstruction technique. COMPARISON:  05/12/2021 FINDINGS: Brain: No acute infarct or hemorrhage. Lateral ventricles and midline structures are unremarkable. No acute extra-axial fluid collections. No mass effect. Vascular: No hyperdense vessel or unexpected calcification. Skull: Normal. Negative for fracture or focal lesion. Sinuses/Orbits: Chronic posttraumatic and postsurgical changes of the inferior and medial left orbital wall unchanged. Paranasal sinuses are clear. Other: None. IMPRESSION: 1. Stable head CT, no acute process. Electronically Signed   By: Randa Ngo M.D.   On: 07/19/2022 22:37   CT ABDOMEN PELVIS WO CONTRAST  Result Date: 07/19/2022 CLINICAL DATA:  Epigastric pain. Patient arrives to ED for seizure activity. Patient postictal and altered on scene per EMS. EXAM:  CT ABDOMEN AND PELVIS WITHOUT CONTRAST TECHNIQUE: Multidetector CT imaging of the abdomen and pelvis was performed following the standard protocol without IV contrast. RADIATION DOSE REDUCTION: This exam was performed according to the departmental dose-optimization program which includes automated exposure control, adjustment of the mA and/or kV according to patient size and/or use of iterative reconstruction technique. COMPARISON:  CT 05/28/2022 FINDINGS: Lower chest: No acute abnormality. Hepatobiliary: Unremarkable liver, gallbladder, and biliary tree. Pancreas: Coarse calcifications in the pancreatic head and body compatible with sequela of chronic pancreatitis. Low-density fluid collection in the pancreatic tail measuring 2.2 cm is likely a pseudocyst. Additional pseudocyst in the distal pancreatic tail measuring 1.1 cm. Spleen: Unremarkable. Adrenals/Urinary Tract: Normal adrenal glands. No urinary calculi or hydronephrosis. Unremarkable bladder. Stomach/Bowel:  Normal caliber large and small bowel. No bowel wall thickening. Unremarkable stomach. Normal appendix. Vascular/Lymphatic: Aortic atherosclerosis. No enlarged abdominal or pelvic lymph nodes. Reproductive: Unremarkable. Other: No free intraperitoneal air.  No abdominal wall hernia. Musculoskeletal: No acute or significant osseous findings. IMPRESSION: 1. No acute abnormality identified in the abdomen or pelvis. 2. 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 Aortic Atherosclerosis (ICD10-I70.0). Electronically Signed   By: Placido Sou M.D.   On: 07/19/2022 22:07   DG Chest 1 View  Result Date: 07/19/2022 CLINICAL DATA:  Seizures, Tachy EXAM: CHEST  1 VIEW COMPARISON:  07/08/2022 FINDINGS: Lungs are clear. Heart size and mediastinal contours are within normal limits. No effusion. Visualized bones unremarkable. IMPRESSION: No acute cardiopulmonary disease. Electronically Signed   By: Lucrezia Europe M.D.   On:  07/19/2022 20:14    Pending Labs Unresulted Labs (From admission, onward)     Start     Ordered   07/27/22 0500  Creatinine, serum  (enoxaparin (LOVENOX)    CrCl >/= 30 ml/min)  Weekly,   R     Comments: while on enoxaparin therapy    07/20/22 0444   07/20/22 1300  Comprehensive metabolic panel  Once-Timed,   TIMED        07/20/22 0511   07/20/22 0451  Magnesium  Add-on,   AD        07/20/22 0450   07/20/22 0444  CBC  (enoxaparin (LOVENOX)    CrCl >/= 30 ml/min)  Once,   R       Comments: Baseline for enoxaparin therapy IF NOT ALREADY DRAWN.  Notify MD if PLT < 100 K.    07/20/22 0444   07/20/22 0209  Blood gas, venous  Once,   R        07/20/22 0208   07/19/22 1956  Rapid urine drug screen (hospital performed)  ONCE - STAT,   STAT        07/19/22 1955   07/19/22 1956  T3, free  Once,   URGENT        07/19/22 1955   07/19/22 1954  Urinalysis, Routine w reflex microscopic -Urine, Clean Catch  (ED Abdominal Pain)  Once,   URGENT       Question:  Specimen Source  Answer:  Urine, Clean Catch   07/19/22 1955            Vitals/Pain Today's Vitals   07/20/22 0345 07/20/22 0415 07/20/22 0433 07/20/22 0450  BP: 130/87 (!) 136/90    Pulse: 66 77 (!) 107   Resp: 17 17    Temp:    97.8 F (36.6 C)  TempSrc:    Oral  SpO2: 98% 96%    PainSc: 3        Isolation Precautions Airborne and Contact precautions  Medications Medications  LORazepam (ATIVAN) tablet 1-4 mg (2 mg Oral Given 07/20/22 0437)  thiamine (VITAMIN B1) tablet 100 mg (has no administration in time range)    Or  thiamine (VITAMIN B1) injection 100 mg (has no administration in time range)  folic acid (FOLVITE) tablet 1 mg (has no administration in time range)  multivitamin with minerals tablet 1 tablet (has no administration in time range)  calcium gluconate 2 g/ 100 mL sodium chloride IVPB (2,000 mg Intravenous New Bag/Given 07/20/22 0454)  enoxaparin (LOVENOX) injection 40 mg (has no administration in time  range)  magnesium sulfate IVPB 2 g 50 mL (2 g Intravenous New Bag/Given 07/20/22 0516)  potassium chloride SA (KLOR-CON M) CR tablet 40 mEq (40 mEq Oral Given 07/20/22 0458)  LORazepam (ATIVAN) injection 2 mg (has no administration in time range)  metoprolol succinate (TOPROL-XL) 24 hr tablet 12.5 mg (12.5 mg Oral Given 07/20/22 0520)  polyethylene glycol (MIRALAX / GLYCOLAX) packet 17 g (has no administration in time range)  prochlorperazine (COMPAZINE) injection 5 mg (has no administration in time range)  melatonin tablet 5 mg (has no administration in time range)  albuterol (PROVENTIL) (2.5 MG/3ML) 0.083% nebulizer solution 3 mL (has no administration in time range)  DULoxetine (CYMBALTA) DR capsule 60 mg (has no administration in time range)  lipase/protease/amylase (CREON) capsule 36,000 Units (has no administration in time range)  pantoprazole (PROTONIX) EC tablet 40 mg (has no administration in time range)  sucralfate (CARAFATE) 1 GM/10ML suspension 1 g (has no administration in time range)  sodium chloride 0.9 % bolus 1,000 mL (0 mLs Intravenous Stopped 07/20/22 0017)  sodium chloride 0.9 % bolus 1,000 mL (0 mLs Intravenous Stopped 07/20/22 0241)  famotidine (PEPCID) IVPB 20 mg premix (0 mg Intravenous Stopped 07/19/22 2310)  ketorolac (TORADOL) 30 MG/ML injection 30 mg (30 mg Intravenous Given 07/19/22 2131)  amLODipine (NORVASC) tablet 10 mg (10 mg Oral Given 07/19/22 2130)  LORazepam (ATIVAN) tablet 2 mg (2 mg Oral Given 07/19/22 2130)  calcium gluconate 1 g/ 50 mL sodium chloride IVPB (0 mg Intravenous Stopped 07/20/22 0017)  metoprolol tartrate (LOPRESSOR) injection 5 mg (5 mg Intravenous Given 07/20/22 0003)  sodium chloride 0.9 % bolus 1,000 mL (0 mLs Intravenous Stopped 07/20/22 0202)  sodium bicarbonate injection 100 mEq (100 mEq Intravenous Given 07/20/22 0459)    Mobility walks     Focused Assessments Neuro Assessment Handoff:  Swallow screen pass? Yes  Cardiac Rhythm: Atrial  fibrillation       Neuro Assessment: Exceptions to WDL (Pt reports no memory of events that have brought him to the hospital. Pt currently a/ox4 and reported to have seizure like activity before arrival to ED) Neuro Checks:      Has TPA been given? No If patient is a Neuro Trauma and patient is going to OR before floor call report to [place] nurse: [phone] or [phone]   R Recommendations: See Admitting Provider Note  Report given to:   Additional Notes:

## 2022-07-20 NOTE — ED Notes (Signed)
Patient continued to become increasingly agitated, security was called to bedside, began to throw objects in room, pulled out both of his IV's, continued yelling at staff, making derogatory comments and swearing at staff, becoming more verbally aggressive. Patient was given ordered medication and placed in soft wrist restraints. Dr. Sloan Leiter made aware and came to bedside to see patient.

## 2022-07-20 NOTE — Consult Note (Addendum)
Cardiology Consultation   Patient ID: [name] MRN: [id number]; DOB: 07/18/1970  Admit date: 07/19/2022 Date of Consult: 07/20/2022  PCP:  Kathyrn Lass   [facility] Providers Cardiologist:  Minus Breeding, MD   Patient Profile:   [name] is a 52 y.o. male with a hx of PAF  who is being seen 07/20/2022 for the evaluation of tachycardia/ Afib wit hRVR, NSVT  at the request of Dr Sloan Leiter .  History of Present Illness:   [name] is a 52 yo with PAF, Hx WPW (s/p ablation), bipolar disorder, subastance abuse, esp ETOH, chronic pancreatitis with multiple ER visits.   No CAD at cath in 2014.      Presented to ER after witness seizure like activity    EMS called   Versed given    In ER BP 143/106  P 121   Sats 99% (RA)   Hgb 10.7  Corrected Ca was 6, HCO3 10    Anion gap 23 , lipase normal, AST 175  ALT 56     Pt received 2 Amp Bicarb, 3 G IV calcium gluconate, 2G Na, 2 doses Po K Amiodarone ordered for Afib with RVR   Midodrine given for mild hypotension.     EEG done   NO evid of seizure   The pt says over the past few weeks he has noticed his heart racing more, esp over the last few days      He has not eaten much this week    Initially said he had not had an EtOH drink since Friday but then said he had one yesterday        Last night he passed out    Groggy on waking He complains of  of low chest, epigastric pain  Pain similar to pain from  pancreatitis       Past Medical History:  Diagnosis Date   Alcoholism /alcohol abuse    Anemia    Anxiety    Arthritis    knees; arms; elbows (03/26/2015)   Asthma    Bipolar disorder ([place])    Chronic bronchitis ([place])    Chronic lower back pain    Chronic pancreatitis ([place])    Cocaine abuse ([place])    Depression    Family history of adverse reaction to anesthesia    Femoral condyle fracture ([name]) 03/08/2014   left medial/notes 03/09/2014   GERD (gastroesophageal reflux disease)    H/O hiatal hernia    H/O  suicide attempt 10/2012   High cholesterol    History of blood transfusion 10/2012   when I tried to commit suicide   History of stomach ulcers    Hypertension    Marijuana abuse, continuous    Migraine    a few times/year (03/26/2015)   Pneumonia 1990's X 3   PTSD (post-traumatic stress disorder)    Sickle cell trait ([place])    WPW (Wolff-Parkinson-White syndrome)    [name] 03/06/2013    Past Surgical History:  Procedure Laterality Date   BIOPSY  11/25/2017   Procedure: BIOPSY;  Surgeon: Arta Silence, MD;  Location: [facility];  Service: Endoscopy;;   BIOPSY  10/14/2018   Procedure: BIOPSY;  Surgeon: Arta Silence, MD;  Location: [facility];  Service: Endoscopy;;   CARDIAC CATHETERIZATION     CYST ENTEROSTOMY  01/02/2020   Procedure: CYST ASPIRATION;  Surgeon: Milus Banister, MD;  Location: [facility];  Service: Endoscopy;;   ESOPHAGOGASTRODUODENOSCOPY (EGD) WITH PROPOFOL N/A  11/25/2017   Procedure: ESOPHAGOGASTRODUODENOSCOPY (EGD) WITH PROPOFOL;  Surgeon: Arta Silence, MD;  Location: [facility] ENDOSCOPY;  Service: Endoscopy;  Laterality: N/A;   ESOPHAGOGASTRODUODENOSCOPY (EGD) WITH PROPOFOL Left 10/14/2018   Procedure: ESOPHAGOGASTRODUODENOSCOPY (EGD) WITH PROPOFOL;  Surgeon: Arta Silence, MD;  Location: [facility] ENDOSCOPY;  Service: Endoscopy;  Laterality: Left;   ESOPHAGOGASTRODUODENOSCOPY (EGD) WITH PROPOFOL N/A 11/14/2018   Procedure: ESOPHAGOGASTRODUODENOSCOPY (EGD) WITH PROPOFOL;  Surgeon: Laurence Spates, MD;  Location: [facility];  Service: Gastroenterology;  Laterality: N/A;   ESOPHAGOGASTRODUODENOSCOPY (EGD) WITH PROPOFOL N/A 01/02/2020   Procedure: ESOPHAGOGASTRODUODENOSCOPY (EGD) WITH PROPOFOL;  Surgeon: Milus Banister, MD;  Location: [facility];  Service: Endoscopy;  Laterality: N/A;   ESOPHAGOGASTRODUODENOSCOPY (EGD) WITH PROPOFOL N/A 10/25/2020   Procedure: ESOPHAGOGASTRODUODENOSCOPY (EGD) WITH PROPOFOL;  Surgeon: Rush Landmark Telford Nab., MD;  Location: [facility];   Service: Gastroenterology;  Laterality: N/A;   EUS N/A 01/02/2020   Procedure: UPPER ENDOSCOPIC ULTRASOUND (EUS) RADIAL;  Surgeon: Milus Banister, MD;  Location: [facility];  Service: Endoscopy;  Laterality: N/A;   EYE SURGERY Left 1990's   "result of trauma"    FACIAL FRACTURE SURGERY Left 1990's   "result of trauma"    FLEXIBLE SIGMOIDOSCOPY N/A 10/25/2020   Procedure: FLEXIBLE SIGMOIDOSCOPY;  Surgeon: Irving Copas., MD;  Location: [place];  Service: Gastroenterology;  Laterality: N/A;   FRACTURE SURGERY     HEMOSTASIS CLIP PLACEMENT  10/25/2020   Procedure: HEMOSTASIS CLIP PLACEMENT;  Surgeon: Irving Copas., MD;  Location: [place];  Service: Gastroenterology;;   HERNIA REPAIR     LEFT HEART CATHETERIZATION WITH CORONARY ANGIOGRAM Right 03/07/2013   Procedure: LEFT HEART CATHETERIZATION WITH CORONARY ANGIOGRAM;  Surgeon: [name], MD;  Location: [facility] CATH LAB;  Service: Cardiovascular;  Laterality: Right;   UMBILICAL HERNIA REPAIR     UPPER GASTROINTESTINAL ENDOSCOPY       Home Medications:  Prior to Admission medications   Medication Sig Start Date End Date Taking? Authorizing Provider  acetaminophen (TYLENOL) 500 MG tablet Take 1,000 mg by mouth every 6 (six) hours as needed for moderate pain or headache.    [provider]  acetaminophen (TYLENOL) 650 MG CR tablet Take 1 tablet (650 mg total) by mouth every 8 (eight) hours as needed for pain. 03/05/22   Nyoka Lint, PA-C  albuterol (VENTOLIN HFA) 108 (90 Base) MCG/ACT inhaler Inhale 2 puffs into the lungs every 4 (four) hours as needed for wheezing or shortness of breath. 08/17/21   [name], MD  amLODipine (NORVASC) 10 MG tablet Take 1 tablet (10 mg total) by mouth daily. 12/25/21   [name], MD  benzonatate (TESSALON) 100 MG capsule Take 1 capsule (100 mg total) by mouth 3 (three) times daily as needed for cough. 03/14/22   Long, Wonda Olds, MD  dextromethorphan-guaiFENesin  [facility] DM) 30-600 MG 12hr tablet Take 1 tablet by mouth 2 (two) times daily. 03/05/22   Nyoka Lint, PA-C  dicyclomine (BENTYL) 10 MG capsule Take 1 capsule (10 mg total) by mouth 2 (two) times daily. 03/15/22   [name], [name], MD  DULoxetine (CYMBALTA) 30 MG capsule Take 2 capsules (60 mg total) by mouth at bedtime. Take 1 capsule daily at bedtime for 2 weeks and then increase to 2 capsules at bedtime 04/06/22   [name], [name], MD  folic acid (FOLVITE) 1 MG tablet Take 1 tablet (1 mg total) by mouth daily. 11/26/17   [name], MD  HYDROcodone-acetaminophen (NORCO/VICODIN) 5-325 MG tablet Take 1 tablet  by mouth every 12 (twelve) hours as needed for moderate pain. 12/24/21   [name], MD  lidocaine (LMX) 4 % cream Apply topically 4 (four) times daily as needed (NGT pain). 12/24/21   [name], MD  lidocaine (XYLOCAINE) 2 % solution Use as directed 15 mLs in the mouth or throat every 6 (six) hours as needed for mouth pain. 07/08/22   Ripley Fraise, MD  lipase/protease/amylase (CREON) 36000 UNITS CPEP capsule Take 2 capsules (72,000 Units total) by mouth 3 (three) times daily with meals. May also take 1 capsule (36,000 Units total) as needed (with snacks). 07/08/22   Ripley Fraise, MD  loperamide (IMODIUM) 2 MG capsule Take 1 capsule (2 mg total) by mouth 4 (four) times daily as needed for diarrhea or loose stools. 02/17/22   Tegeler, Gwenyth Allegra, MD  magnesium oxide (MAG-OX) 400 (240 Mg) MG tablet Take 1 tablet (400 mg total) by mouth daily. 11/30/21   Davonna Belling, MD  metoprolol succinate (TOPROL XL) 25 MG 24 hr tablet Take 1 tablet (25 mg total) by mouth as needed (for palpitations). To not exceed 1 dose per day. Patient taking differently: Take 50 mg by mouth daily. 01/06/22   Duke, Tami Lin, PA  metoprolol succinate (TOPROL-XL) 200 MG 24 hr tablet Take 1 tablet (200 mg total) by mouth daily. Take with or immediately following a meal. 02/14/22   Minus Breeding, MD  Multiple Vitamin (MULTIVITAMIN WITH MINERALS) TABS tablet Take 1 tablet by mouth daily. 09/06/17   Bonnell Public, MD  nicotine (NICODERM CQ - DOSED IN MG/24 HOURS) 21 mg/24hr patch Place 1 patch (21 mg total) onto the skin at bedtime. 12/24/21   [name], MD  nicotine polacrilex (NICORETTE STARTER KIT) 4 MG gum Take 1 each (4 mg total) by mouth as needed for smoking cessation. 03/15/22   [name], [name], MD  nitroGLYCERIN (NITROSTAT) 0.4 MG SL tablet Place 1 tablet (0.4 mg total) under the tongue every 5 (five) minutes as needed for chest pain. Do not exceed 3 doses in a row. 01/06/22   Duke, Tami Lin, PA  ondansetron (ZOFRAN) 4 MG tablet Take 1 tablet (4 mg total) by mouth every 8 (eight) hours as needed for nausea or vomiting. 05/18/22   [name], [name], MD  oxyCODONE (ROXICODONE) 5 MG immediate release tablet Take 1 tablet (5 mg total) by mouth every 4 (four) hours as needed for severe pain. 02/17/22   Tegeler, Gwenyth Allegra, MD  oxyCODONE-acetaminophen (PERCOCET/ROXICET) 5-325 MG tablet Take 1 tablet by mouth every 6 (six) hours as needed for severe pain. 03/14/22   Long, Wonda Olds, MD  pantoprazole (PROTONIX) 40 MG tablet Take 1 tablet (40 mg total) by mouth 2 (two) times daily. 06/27/22   Tedd Sias, PA  Polyethyl Glycol-Propyl Glycol (SYSTANE OP) Place 1 drop into both eyes 4 (four) times daily as needed (for dryness).    [provider]  sucralfate (CARAFATE) 1 GM/10ML suspension Take 10 mLs (1 g total) by mouth 4 (four) times daily -  with meals and at bedtime. 07/08/22   Ripley Fraise, MD  thiamine 100 MG tablet Take 1 tablet (100 mg total) by mouth daily. 10/16/18   [name], MD  Vitamin D, Ergocalciferol, (DRISDOL) 1.25 MG (50000 UNIT) CAPS capsule Take 50,000 Units by mouth every Wednesday.    [provider]  amitriptyline (ELAVIL) 25 MG tablet Take 1 tablet (25 mg total) by mouth at bedtime. Patient not taking:  Reported on  08/08/2019 10/15/18 08/08/19  [name], MD    Inpatient Medications: Scheduled Meds:  calcitRIOL  0.5 mcg Oral Daily   calcium-vitamin D  1 tablet Oral BID   DULoxetine  60 mg Oral QHS   enoxaparin (LOVENOX) injection  40 mg Subcutaneous Daily   folic acid  1 mg Oral Daily   lipase/protease/amylase  36,000 Units Oral TID AC   metoprolol tartrate  25 mg Oral BID   multivitamin with minerals  1 tablet Oral Daily   pantoprazole  40 mg Oral BID   potassium chloride  40 mEq Oral BID WC   sucralfate  1 g Oral TID WC & HS   thiamine  100 mg Oral Daily   Or   thiamine  100 mg Intravenous Daily   Continuous Infusions:  amiodarone 30 mg/hr (07/20/22 [id number])   calcium gluconate     magnesium sulfate bolus IVPB 4 g (07/20/22 0821)   sodium bicarbonate 150 mEq in dextrose 5 % 1,150 mL infusion 75 mL/hr at 07/20/22 0846   PRN Meds: albuterol, LORazepam, LORazepam, melatonin, polyethylene glycol, prochlorperazine  Allergies:    Allergies  Allergen Reactions   Robaxin [Methocarbamol] Other (See Comments)    jumpy limbs   Aspirin Other (See Comments)    Unknown reaction   Shellfish-Derived Products Nausea And Vomiting and Rash   Trazodone And Nefazodone Other (See Comments)    Muscle spasms   Adhesive [Tape] Itching   Latex Itching   Toradol [Ketorolac Tromethamine] Other (See Comments)    Has ulcers; cannot have this   Contrast Media [Iodinated Contrast Media] Hives   Reglan [Metoclopramide] Other (See Comments)    Muscle spasms   Salmon [Fish Oil] Nausea And Vomiting and Rash    Social History:   Social History   Socioeconomic History   Marital status: Single    Spouse name: Not on file   Number of children: Not on file   Years of education: Not on file   Highest education level: Not on file  Occupational History   Occupation: disabled  Tobacco Use   Smoking status: Every Day    Packs/day: 1.00    Years: 36.00    Total pack years: 36.00    Types: Cigarettes,  E-cigarettes   Smokeless tobacco: Never  Vaping Use   Vaping Use: Former  Substance and Sexual Activity   Alcohol use: Not Currently    Comment: last drink a few days ago   Drug use: Not Currently    Types: Marijuana, Cocaine   Sexual activity: Yes    Birth control/protection: None  Other Topics Concern   Not on file  Social History Narrative   ** Merged History Encounter **       Social Determinants of Health   Financial Resource Strain: Not on file  Food Insecurity: Not on file  Transportation Needs: Not on file  Physical Activity: Not on file  Stress: Not on file  Social Connections: Not on file  Intimate Partner Violence: Not on file    Family History:    Family History  Problem Relation Age of Onset   Hypertension Mother    Cirrhosis Father    Alcoholism Father    Hypertension Father    Melanoma Father    Hypertension Other    Coronary artery disease Other      ROS:  Please see the history of present illness.   All other ROS reviewed and negative.  Physical Exam/Data:   Vitals:   07/20/22 0843 07/20/22 0845 07/20/22 0915 07/20/22 0923  BP:  (!) 141/106 (!) 148/106 (!) 161/114  Pulse:  (!) 105  (!) 179  Resp:  (!) 23 20   Temp: (!) 97.4 F (36.3 C)     TempSrc: Axillary     SpO2:  100%      Intake/Output Summary (Last 24 hours) at 07/20/2022 1015 Last data filed at 07/20/2022 0241 Gross per 24 hour  Intake 3093.76 ml  Output --  Net 3093.76 ml      07/08/2022    5:19 AM 06/27/2022    2:26 PM 05/21/2022   12:47 PM  Last 3 Weights  Weight (lbs) 135 lb 135 lb 115 lb  Weight (kg) 61.236 kg 61.236 kg 52.164 kg     There is no height or weight on file to calculate BMI.  General:  Thin 52 yo in NAD  HEENT: normal Neck: JVP is normal   No bruits  Vascular: No carotid bruits; Distal pulses 2+ bilaterally Cardiac  Irreg irreg   N oS3   No murmurs    Lungs:  clear to auscultation bilaterally  No rales  Abd:   Ext: no LE  edema Musculoskeletal:   No deformities, BUE and BLE strength normal and equal Skin: warm and dry  Neuro:  CNs 2-12 intact, no focal abnormalities noted Psych:  Normal affect   EKG:  The EKG was personally reviewed and demonstrates:  Afib with RVR  narrow complex    172 bpm  ST Twave changes anterolaterally , consider ischemia   Telemetry:  Telemetry was personally reviewed and demonstrates:  Afib with RVR with NSVT    Relevant CV Studies:  Echo October 2023     1. Left ventricular ejection fraction, by estimation, is 65 to 70%. The  left ventricle has normal function. The left ventricle has no regional  wall motion abnormalities. There is mild left ventricular hypertrophy.  Left ventricular diastolic parameters  were grossly normal. No significant intracavitary gradient.   2. Right ventricular systolic function is normal. The right ventricular  size is normal.   3. The mitral valve is normal in structure. No evidence of mitral valve  regurgitation. No evidence of mitral stenosis.   4. The aortic valve is grossly normal. Aortic valve regurgitation is not  visualized. No aortic stenosis is present.   5. Rhythm strip during this exam demonstrates frequent ectopy/premature  atrial contractions.    Laboratory Data:  High Sensitivity Troponin:   Recent Labs  Lab 06/27/22 1439 06/27/22 1629 07/19/22 2010 07/19/22 2210  TROPONINIHS '13 15 15 '$ 20*     Chemistry Recent Labs  Lab 07/19/22 2010 07/20/22 0324 07/20/22 0408 07/20/22 0515  NA 136 141 142  --   K 3.6 3.4* 3.6  --   CL 103 115*  --   --   CO2 10* 12*  --   --   GLUCOSE 172* 72  --   --   BUN <5* <5*  --   --   CREATININE 1.27* 0.76  --   --   CALCIUM 5.9* 5.6*  --   --   MG  --   --   --  0.8*  GFRNONAA >60 >60  --   --   ANIONGAP 23* 14  --   --     Recent Labs  Lab 07/19/22 2010  PROT 7.2  ALBUMIN 3.9  AST 175*  ALT 56*  ALKPHOS 96  BILITOT 0.4   Lipids No results for input(s): "CHOL", "TRIG", "HDL", "LABVLDL", "LDLCALC",  "CHOLHDL" in the last 168 hours.  Hematology Recent Labs  Lab 07/19/22 2010 07/20/22 0408 07/20/22 0515  WBC 8.3  --  11.1*  RBC 3.40*  --  3.86*  HGB 10.7* 12.2* 12.0*  HCT 33.0* 36.0* 37.5*  MCV 97.1  --  97.2  MCH 31.5  --  31.1  MCHC 32.4  --  32.0  RDW 16.6*  --  16.9*  PLT 132*  --  124*   Thyroid  Recent Labs  Lab 07/19/22 2010  TSH 2.004  FREET4 0.59*    BNPNo results for input(s): "BNP", "PROBNP" in the last 168 hours.  DDimer No results for input(s): "DDIMER" in the last 168 hours.   Radiology/Studies:  EEG adult  Result Date: 07/20/2022 Lora Havens, MD     07/20/2022 10:11 AM Patient Name: [name] MRN: [id number] Epilepsy Attending: Lora Havens Referring Physician/Provider: Kayleen Memos, DO Date: 313/2024 Duration: 24.36 mins Patient history: 52yo M with seizure in setting of alcohol withdrawal. EEG to evaluate for seizure Level of alertness: Awake AEDs during EEG study: Ativan Technical aspects: This EEG study was done with scalp electrodes positioned according to the 10-20 International system of electrode placement. Electrical activity was reviewed with band pass filter of 1-'70Hz'$ , sensitivity of 7 uV/mm, display speed of 55m/sec with a '60Hz'$  notched filter applied as appropriate. EEG data were recorded continuously and digitally stored.  Video monitoring was available and reviewed as appropriate. Description: The posterior dominant rhythm consists of 9 Hz activity of moderate voltage (25-35 uV) seen predominantly in posterior head regions, symmetric and reactive to eye opening and eye closing. There is an excessive amount of 15 to 18 Hz beta activity distributed symmetrically and diffusely. Physiologic photic driving was seen during photic stimulation.  Hyperventilation was not performed.   ABNORMALITY - Excessive beta, generalized IMPRESSION: This study is within normal limits. The excessive beta activity seen in the background is most likely  due to the effect of benzodiazepine and is a benign EEG pattern. No seizures or epileptiform discharges were seen throughout the recording. PLora Havens  CT Head Wo Contrast  Result Date: 07/19/2022 CLINICAL DATA:  Seizure, head trauma EXAM: CT HEAD WITHOUT CONTRAST TECHNIQUE: Contiguous axial images were obtained from the base of the skull through the vertex without intravenous contrast. RADIATION DOSE REDUCTION: This exam was performed according to the departmental dose-optimization program which includes automated exposure control, adjustment of the mA and/or kV according to patient size and/or use of iterative reconstruction technique. COMPARISON:  05/12/2021 FINDINGS: Brain: No acute infarct or hemorrhage. Lateral ventricles and midline structures are unremarkable. No acute extra-axial fluid collections. No mass effect. Vascular: No hyperdense vessel or unexpected calcification. Skull: Normal. Negative for fracture or focal lesion. Sinuses/Orbits: Chronic posttraumatic and postsurgical changes of the inferior and medial left orbital wall unchanged. Paranasal sinuses are clear. Other: None. IMPRESSION: 1. Stable head CT, no acute process. Electronically Signed   By: MRanda NgoM.D.   On: 07/19/2022 22:37   CT ABDOMEN PELVIS WO CONTRAST  Result Date: 07/19/2022 CLINICAL DATA:  Epigastric pain. Patient arrives to ED for seizure activity. Patient postictal and altered on scene per EMS. EXAM: CT ABDOMEN AND PELVIS WITHOUT CONTRAST TECHNIQUE: Multidetector CT imaging of the abdomen and pelvis was performed following the standard protocol without IV contrast. RADIATION DOSE REDUCTION: This exam was performed according to  the departmental dose-optimization program which includes automated exposure control, adjustment of the mA and/or kV according to patient size and/or use of iterative reconstruction technique. COMPARISON:  CT 05/28/2022 FINDINGS: Lower chest: No acute abnormality. Hepatobiliary:  Unremarkable liver, gallbladder, and biliary tree. Pancreas: Coarse calcifications in the pancreatic head and body compatible with sequela of chronic pancreatitis. Low-density fluid collection in the pancreatic tail measuring 2.2 cm is likely a pseudocyst. Additional pseudocyst in the distal pancreatic tail measuring 1.1 cm. Spleen: Unremarkable. Adrenals/Urinary Tract: Normal adrenal glands. No urinary calculi or hydronephrosis. Unremarkable bladder. Stomach/Bowel: Normal caliber large and small bowel. No bowel wall thickening. Unremarkable stomach. Normal appendix. Vascular/Lymphatic: Aortic atherosclerosis. No enlarged abdominal or pelvic lymph nodes. Reproductive: Unremarkable. Other: No free intraperitoneal air.  No abdominal wall hernia. Musculoskeletal: No acute or significant osseous findings. IMPRESSION: 1. No acute abnormality identified in the abdomen or pelvis. 2. 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 Aortic Atherosclerosis (ICD10-I70.0). Electronically Signed   By: Placido Sou M.D.   On: 07/19/2022 22:07   DG Chest 1 View  Result Date: 07/19/2022 CLINICAL DATA:  Seizures, Tachy EXAM: CHEST  1 VIEW COMPARISON:  07/08/2022 FINDINGS: Lungs are clear. Heart size and mediastinal contours are within normal limits. No effusion. Visualized bones unremarkable. IMPRESSION: No acute cardiopulmonary disease. Electronically Signed   By: Lucrezia Europe M.D.   On: 07/19/2022 20:14     Assessment and Plan:   AFib with RVR   Pt presented with afib with RVR  In setting of profound electrolyte abnormalities    He has noticed his heart racing a lot recently    He has been started on IV amiodarone (no bolus)     Initially BP marginal    High now     Would continue IV amio  Bolus and increase IV rate   Can also use b blocker.    With GI history I would hold anticoagulation right now   Follow H/H as he gets IV fluids    The pt's CHADSVASc score  is 2 (HTN, aortic  atherosclerosis)  With hx of EtOH abuse  and pancreatitis he is not a good candidate for anticoagulation  2   "Seizure"   Pt presented after passing out  No evid seizure on EEG      May represent afib with RVR and hypotension vs other arrhythmia  (VT)    Continue telemetry      Would get echo when rates improved to reassess LVEF   3   NSVT  Same recs as noted above   Repeat echo   Keep on tele  Continue IV amiodarone      4.  HTN  BP high   Favor use of b blocker for HR and BP control    5  Hx CP / abdominal pain   Trop 20 yesterday   Would repeat to confirm no high trend   May be GI  though amylase negative   6  atherosclerosis   CT of abdomen showed atherosclerosis of aorta .  7  Electrolytes    Prfound abnormaliites particularly of Ca   Being repleted   Follow   8   renal   Cr 1.27 on admit   Getting IV lfuids     9  GI    Elevated LFTs consistent with EtOH abuse      Getting IV fluids and pain meds   10  Hx WPW  S/p ablation .  QRS complex is narrow  for afib   Will continue to follow     For questions or updates, please contact [place] Please consult [email or web address] for contact info under    Signed, Dorris Carnes, MD  07/20/2022 10:15 AM

## 2022-07-20 NOTE — ED Notes (Signed)
[name] HR 160s-180s a fib. MD Irene Pap notified again and RN requested MD come to bedside to assess pt.

## 2022-07-20 NOTE — Progress Notes (Addendum)
Started on Amiodarone drip for afib rvr. 1 dose of midodrine ordered 10 mg x1 for soft BP. No evidence of lethargy, answers questions appropriately and follows commands.  Returned to see the patient.   Cardiology has been consulted to assist with the management of his Afib with RVR.  Time: 15 minutes

## 2022-07-21 ENCOUNTER — Encounter (HOSPITAL_COMMUNITY): Payer: Self-pay | Admitting: Internal Medicine

## 2022-07-21 DIAGNOSIS — R000 Tachycardia, unspecified: Secondary | ICD-10-CM | POA: Diagnosis not present

## 2022-07-21 DIAGNOSIS — R569 Unspecified convulsions: Secondary | ICD-10-CM | POA: Diagnosis not present

## 2022-07-21 DIAGNOSIS — F1010 Alcohol abuse, uncomplicated: Secondary | ICD-10-CM | POA: Diagnosis not present

## 2022-07-21 LAB — CBC
HCT: 28.3 % — ABNORMAL LOW (ref 39.0–52.0)
Hemoglobin: 9.1 g/dL — ABNORMAL LOW (ref 13.0–17.0)
MCH: 32 pg (ref 26.0–34.0)
MCHC: 32.2 g/dL (ref 30.0–36.0)
MCV: 99.6 fL (ref 80.0–100.0)
Platelets: 99 10*3/uL — ABNORMAL LOW (ref 150–400)
RBC: 2.84 MIL/uL — ABNORMAL LOW (ref 4.22–5.81)
RDW: 17.7 % — ABNORMAL HIGH (ref 11.5–15.5)
WBC: 9.7 10*3/uL (ref 4.0–10.5)
nRBC: 0 % (ref 0.0–0.2)

## 2022-07-21 LAB — COMPREHENSIVE METABOLIC PANEL
ALT: 48 U/L — ABNORMAL HIGH (ref 0–44)
AST: 99 U/L — ABNORMAL HIGH (ref 15–41)
Albumin: 3.3 g/dL — ABNORMAL LOW (ref 3.5–5.0)
Alkaline Phosphatase: 87 U/L (ref 38–126)
Anion gap: 10 (ref 5–15)
BUN: <5 mg/dL — ABNORMAL LOW (ref 6–20)
CO2: 19 mmol/L — ABNORMAL LOW (ref 22–32)
Calcium: 7.2 mg/dL — ABNORMAL LOW (ref 8.9–10.3)
Chloride: 109 mmol/L (ref 98–111)
Creatinine, Ser: 0.66 mg/dL (ref 0.61–1.24)
GFR, Estimated: >60 mL/min (ref >60)
Glucose, Bld: 101 mg/dL — ABNORMAL HIGH (ref 70–99)
Potassium: 4.7 mmol/L (ref 3.5–5.1)
Sodium: 138 mmol/L (ref 135–145)
Total Bilirubin: 0.9 mg/dL (ref 0.3–1.2)
Total Protein: 6.2 g/dL — ABNORMAL LOW (ref 6.5–8.1)

## 2022-07-21 LAB — MAGNESIUM: Magnesium: 1.6 mg/dL — ABNORMAL LOW (ref 1.7–2.4)

## 2022-07-21 LAB — PHOSPHORUS: Phosphorus: 2.4 mg/dL — ABNORMAL LOW (ref 2.5–4.6)

## 2022-07-21 LAB — T3, FREE: T3, Free: 3.3 pg/mL (ref 2.0–4.4)

## 2022-07-21 LAB — PARATHYROID HORMONE, INTACT (NO CA): PTH: 57 pg/mL (ref 15–65)

## 2022-07-21 LAB — CALCITRIOL (1,25 DI-OH VIT D): Vit D, 1,25-Dihydroxy: 52.5 pg/mL (ref 24.8–81.5)

## 2022-07-21 MED ORDER — SODIUM BICARBONATE 650 MG PO TABS
650.0000 mg | ORAL_TABLET | Freq: Two times a day (BID) | ORAL | Status: DC
  Administered 2022-07-21 – 2022-07-22 (×4): 650 mg via ORAL
  Filled 2022-07-21 (×4): qty 1

## 2022-07-21 MED ORDER — METOPROLOL TARTRATE 50 MG PO TABS
50.0000 mg | ORAL_TABLET | Freq: Four times a day (QID) | ORAL | Status: DC
  Administered 2022-07-21 – 2022-07-22 (×4): 50 mg via ORAL
  Filled 2022-07-21 (×4): qty 1

## 2022-07-21 MED ORDER — MAGNESIUM SULFATE 4 GM/100ML IV SOLN
4.0000 g | Freq: Once | INTRAVENOUS | Status: AC
  Administered 2022-07-21: 4 g via INTRAVENOUS
  Filled 2022-07-21: qty 100

## 2022-07-21 MED ORDER — HYDROMORPHONE HCL 1 MG/ML IJ SOLN
1.0000 mg | INTRAMUSCULAR | Status: DC | PRN
  Administered 2022-07-21 (×2): 1 mg via INTRAVENOUS
  Administered 2022-07-22 – 2022-07-23 (×7): 2 mg via INTRAVENOUS
  Filled 2022-07-21: qty 1
  Filled 2022-07-21 (×4): qty 2
  Filled 2022-07-21: qty 1
  Filled 2022-07-21 (×3): qty 2

## 2022-07-21 NOTE — Progress Notes (Signed)
Rounding Note    Patient Name: [name] Date of Encounter: 07/21/2022  [place] Cardiologist: Minus Breeding, MD   Subjective   Pt is sleeping soundly    Inpatient Medications    Scheduled Meds:  calcitRIOL  0.5 mcg Oral Daily   calcium-vitamin D  1 tablet Oral BID   dicyclomine  10 mg Oral BID   DULoxetine  60 mg Oral QHS   enoxaparin (LOVENOX) injection  40 mg Subcutaneous Daily   folic acid  1 mg Oral Daily   lipase/protease/amylase  36,000 Units Oral TID AC   LORazepam  0-4 mg Intravenous Q6H   Followed by   Derrill Memo ON 07/22/2022] LORazepam  0-4 mg Intravenous Q12H   metoprolol tartrate  25 mg Oral QID   multivitamin with minerals  1 tablet Oral Daily   pantoprazole  40 mg Oral BID   sucralfate  1 g Oral TID WC & HS   thiamine  100 mg Oral Daily   Or   thiamine  100 mg Intravenous Daily   Continuous Infusions:  amiodarone 30 mg/hr (07/21/22 0155)   sodium bicarbonate 150 mEq in dextrose 5 % 1,150 mL infusion Stopped (07/20/22 1655)   PRN Meds: albuterol, alum & mag hydroxide-simeth, HYDROcodone-acetaminophen, HYDROmorphone (DILAUDID) injection, LORazepam **OR** LORazepam, LORazepam, melatonin, metoprolol tartrate, polyethylene glycol, prochlorperazine   Vital Signs    Vitals:   07/21/22 0330 07/21/22 0345 07/21/22 0400 07/21/22 0428  BP: (!) 123/103 (!) 127/98 120/83 (!) 129/103  Pulse:    (!) 119  Resp: (!) '24 20 18   '$ Temp:      TempSrc:      SpO2:        Intake/Output Summary (Last 24 hours) at 07/21/2022 0605 Last data filed at 07/21/2022 0100 Gross per 24 hour  Intake 633.07 ml  Output --  Net 633.07 ml      07/08/2022    5:19 AM 06/27/2022    2:26 PM 05/21/2022   12:47 PM  Last 3 Weights  Weight (lbs) 135 lb 135 lb 115 lb  Weight (kg) 61.236 kg 61.236 kg 52.164 kg      Telemetry    Afib   110s to 140s   - Personally Reviewed  ECG    No new  - Personally Reviewed  Physical Exam   GEN: No acute distress.   Sleeping soundly  Neck: No obvious JVD   Cardiac: Irreg irreg   No  S3    Respiratory: Mild rhonchi    GI: Soft, nontender, non-distended  Ext   No LE edema  Labs    High Sensitivity Troponin:   Recent Labs  Lab 06/27/22 1439 06/27/22 1629 07/19/22 2010 07/19/22 2210 07/20/22 1113  TROPONINIHS '13 15 15 '$ 20* 14     Chemistry Recent Labs  Lab 07/19/22 2010 07/20/22 0324 07/20/22 0408 07/20/22 0515 07/20/22 1408 07/20/22 1714  NA 136 141 142  --  136 137  K 3.6 3.4* 3.6  --  3.1* 3.3*  CL 103 115*  --   --  104 106  CO2 10* 12*  --   --  18* 19*  GLUCOSE 172* 72  --   --  202* 156*  BUN <5* <5*  --   --  <5* <5*  CREATININE 1.27* 0.76  --   --  0.86 0.91  CALCIUM 5.9* 5.6*  --   --  7.0* 7.1*  MG  --   --   --  0.8*  --  1.7  PROT 7.2  --   --   --  6.0*  --   ALBUMIN 3.9  --   --   --  3.2*  --   AST 175*  --   --   --  115*  --   ALT 56*  --   --   --  44  --   ALKPHOS 96  --   --   --  84  --   BILITOT 0.4  --   --   --  0.5  --   GFRNONAA >60 >60  --   --  >60 >60  ANIONGAP 23* 14  --   --  14 12    Lipids No results for input(s): "CHOL", "TRIG", "HDL", "LABVLDL", "LDLCALC", "CHOLHDL" in the last 168 hours.  Hematology Recent Labs  Lab 07/19/22 2010 07/20/22 0408 07/20/22 0515  WBC 8.3  --  11.1*  RBC 3.40*  --  3.86*  HGB 10.7* 12.2* 12.0*  HCT 33.0* 36.0* 37.5*  MCV 97.1  --  97.2  MCH 31.5  --  31.1  MCHC 32.4  --  32.0  RDW 16.6*  --  16.9*  PLT 132*  --  124*   Thyroid  Recent Labs  Lab 07/19/22 2010  TSH 2.004  FREET4 0.59*    BNPNo results for input(s): "BNP", "PROBNP" in the last 168 hours.  DDimer No results for input(s): "DDIMER" in the last 168 hours.   Radiology    EEG adult  Result Date: 07/20/2022 Lora Havens, MD     07/20/2022 10:11 AM Patient Name: Joeangel Prorok MRN: [id number] Epilepsy Attending: Lora Havens Referring Physician/Provider: Kayleen Memos, DO Date: 313/2024 Duration: 24.36 mins Patient  history: 52yo M with seizure in setting of alcohol withdrawal. EEG to evaluate for seizure Level of alertness: Awake AEDs during EEG study: Ativan Technical aspects: This EEG study was done with scalp electrodes positioned according to the 10-20 International system of electrode placement. Electrical activity was reviewed with band pass filter of 1-'70Hz'$ , sensitivity of 7 uV/mm, display speed of 43m/sec with a '60Hz'$  notched filter applied as appropriate. EEG data were recorded continuously and digitally stored.  Video monitoring was available and reviewed as appropriate. Description: The posterior dominant rhythm consists of 9 Hz activity of moderate voltage (25-35 uV) seen predominantly in posterior head regions, symmetric and reactive to eye opening and eye closing. There is an excessive amount of 15 to 18 Hz beta activity distributed symmetrically and diffusely. Physiologic photic driving was seen during photic stimulation.  Hyperventilation was not performed.   ABNORMALITY - Excessive beta, generalized IMPRESSION: This study is within normal limits. The excessive beta activity seen in the background is most likely due to the effect of benzodiazepine and is a benign EEG pattern. No seizures or epileptiform discharges were seen throughout the recording. PLora Havens  CT Head Wo Contrast  Result Date: 07/19/2022 CLINICAL DATA:  Seizure, head trauma EXAM: CT HEAD WITHOUT CONTRAST TECHNIQUE: Contiguous axial images were obtained from the base of the skull through the vertex without intravenous contrast. RADIATION DOSE REDUCTION: This exam was performed according to the departmental dose-optimization program which includes automated exposure control, adjustment of the mA and/or kV according to patient size and/or use of iterative reconstruction technique. COMPARISON:  05/12/2021 FINDINGS: Brain: No acute infarct or hemorrhage. Lateral ventricles and midline structures are unremarkable. No acute extra-axial  fluid collections. No mass effect.  Vascular: No hyperdense vessel or unexpected calcification. Skull: Normal. Negative for fracture or focal lesion. Sinuses/Orbits: Chronic posttraumatic and postsurgical changes of the inferior and medial left orbital wall unchanged. Paranasal sinuses are clear. Other: None. IMPRESSION: 1. Stable head CT, no acute process. Electronically Signed   By: Randa Ngo M.D.   On: 07/19/2022 22:37   CT ABDOMEN PELVIS WO CONTRAST  Result Date: 07/19/2022 CLINICAL DATA:  Epigastric pain. Patient arrives to ED for seizure activity. Patient postictal and altered on scene per EMS. EXAM: CT ABDOMEN AND PELVIS WITHOUT CONTRAST TECHNIQUE: Multidetector CT imaging of the abdomen and pelvis was performed following the standard protocol without IV contrast. RADIATION DOSE REDUCTION: This exam was performed according to the departmental dose-optimization program which includes automated exposure control, adjustment of the mA and/or kV according to patient size and/or use of iterative reconstruction technique. COMPARISON:  CT 05/28/2022 FINDINGS: Lower chest: No acute abnormality. Hepatobiliary: Unremarkable liver, gallbladder, and biliary tree. Pancreas: Coarse calcifications in the pancreatic head and body compatible with sequela of chronic pancreatitis. Low-density fluid collection in the pancreatic tail measuring 2.2 cm is likely a pseudocyst. Additional pseudocyst in the distal pancreatic tail measuring 1.1 cm. Spleen: Unremarkable. Adrenals/Urinary Tract: Normal adrenal glands. No urinary calculi or hydronephrosis. Unremarkable bladder. Stomach/Bowel: Normal caliber large and small bowel. No bowel wall thickening. Unremarkable stomach. Normal appendix. Vascular/Lymphatic: Aortic atherosclerosis. No enlarged abdominal or pelvic lymph nodes. Reproductive: Unremarkable. Other: No free intraperitoneal air.  No abdominal wall hernia. Musculoskeletal: No acute or significant osseous findings.  IMPRESSION: 1. No acute abnormality identified in the abdomen or pelvis. 2. 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 Aortic Atherosclerosis (ICD10-I70.0). Electronically Signed   By: Placido Sou M.D.   On: 07/19/2022 22:07   DG Chest 1 View  Result Date: 07/19/2022 CLINICAL DATA:  Seizures, Tachy EXAM: CHEST  1 VIEW COMPARISON:  07/08/2022 FINDINGS: Lungs are clear. Heart size and mediastinal contours are within normal limits. No effusion. Visualized bones unremarkable. IMPRESSION: No acute cardiopulmonary disease. Electronically Signed   By: Lucrezia Europe M.D.   On: 07/19/2022 20:14    Cardiac Studies   Limited Echo  October 2023  1. Left ventricular ejection fraction, by estimation, is 65 to 70%. The  left ventricle has normal function. The left ventricle has no regional  wall motion abnormalities. There is mild left ventricular hypertrophy.  Left ventricular diastolic parameters  were grossly normal. No significant intracavitary gradient.   2. Right ventricular systolic function is normal. The right ventricular  size is normal.   3. The mitral valve is normal in structure. No evidence of mitral valve  regurgitation. No evidence of mitral stenosis.   4. The aortic valve is grossly normal. Aortic valve regurgitation is not  visualized. No aortic stenosis is present.   5. Rhythm strip during this exam demonstrates frequent ectopy/premature  atrial contractions.    Patient Profile      Kallon Azan Faver is a 52 y.o. male with a hx of PAF  who is being seen 07/20/2022 for the evaluation of tachycardia/ Afib wit hRVR, NSVT  at the request of Dr Sloan Leiter .  Assessment & Plan    1  Atrial fibrillation  Pt remains in Afib with RVR   on metoprolo and amiodarone      Driven probably by electrolyrte abnormalities, agitation (on CIWA protocol)     I would continue amiodarone infusion   I dont think he will convert I would push  metoprolol dose to  50 mg q 6 hours      Not a good candidate for anticoag given Etoh abuse , GI hx , noncompliance   2  Syncope   No evid of seizure    Happened prior to admit   On admit On admit pt in  afib / RVR with profound electrolyte abnormalities at the time         3  NSVT  On tele yesterday   No sustained   Follow   4  HTN    5  Hx CP / Abdominal pain  Trop neg x 2  Does not appear to be cardiac  Hx of pancreatitis and abdominal pain    6  F/E/N  Continues to get repletion  7  Hx WPW  s/p ablation     For questions or updates, please contact [place] Please consult [email or web address] for contact info under        Signed, Dorris Carnes, MD  07/21/2022, 6:05 AM

## 2022-07-21 NOTE — ED Notes (Signed)
Pt provided breakfast. Restraints removed but readily available if needed.

## 2022-07-21 NOTE — Progress Notes (Signed)
During safety round, patient found trying to get out of bed. While being redirected to get back to bed, this RN noticed the smell of cigarette. Upon questioning, pt did admit , stating 'I just smoked only a little bit''. Notified charge notified and security called for safety

## 2022-07-21 NOTE — ED Notes (Signed)
ED TO INPATIENT HANDOFF REPORT  ED Nurse Name and Phone #: Aki Abalos [phone]   S Name/Age/Gender [name] 52 y.o. male Room/Bed: 008C/008C  Code Status   Code Status: Full Code  Home/SNF/Other Home Patient oriented to: self, place, time, and situation Is this baseline? Yes   Triage Complete: Triage complete  Chief Complaint Seizure [facility]) [R56.9]  Triage Note Pt arrives to ED from friends house for seizure like activity. Pt postictal and altered on scene per EMS. Pt was given '5mg'$  of versed because of combative behavior. CBG 187. Unknown if pt hit ground and unknown time. No blood thinners reported. Pt with hx of WPW    Allergies Allergies  Allergen Reactions   Robaxin [Methocarbamol] Other (See Comments)    jumpy limbs   Aspirin Other (See Comments)    Unknown reaction   Shellfish-Derived Products Nausea And Vomiting and Rash   Trazodone And Nefazodone Other (See Comments)    Muscle spasms   Adhesive [Tape] Itching   Latex Itching   Toradol [Ketorolac Tromethamine] Other (See Comments)    Has ulcers; cannot have this   Contrast Media [Iodinated Contrast Media] Hives   Reglan [Metoclopramide] Other (See Comments)    Muscle spasms   Salmon [Fish Oil] Nausea And Vomiting and Rash    Level of Care/Admitting Diagnosis ED Disposition     ED Disposition  Admit   Condition  --   [place]: [place] [[id number]]  Level of Care: Progressive [102]  Admit to Progressive based on following criteria: NEUROLOGICAL AND NEUROSURGICAL complex patients with significant risk of instability, who do not meet ICU criteria, yet require close observation or frequent assessment (< / = every 2 - 4 hours) with medical / nursing intervention.  May admit patient to [facility] or [facility] if equivalent level of care is available:: Yes  Covid Evaluation: Asymptomatic - no recent exposure (last 10 days) testing not required  Diagnosis: Seizure [facility])  [id number]  Admitting Physician: Kayleen Memos [id number]  Attending Physician: Kayleen Memos [id number]  Certification:: I certify this patient will need inpatient services for at least 2 midnights  Estimated Length of Stay: 2          B Medical/Surgery History Past Medical History:  Diagnosis Date   Alcoholism /alcohol abuse    Anemia    Anxiety    Arthritis    knees; arms; elbows (03/26/2015)   Asthma    Bipolar disorder ([place])    Chronic bronchitis ([place])    Chronic lower back pain    Chronic pancreatitis ([place])    Cocaine abuse ([place])    Depression    Family history of adverse reaction to anesthesia    Femoral condyle fracture ([place]) 03/08/2014   left medial/notes 03/09/2014   GERD (gastroesophageal reflux disease)    H/O hiatal hernia    H/O suicide attempt 10/2012   High cholesterol    History of blood transfusion 10/2012   when I tried to commit suicide   History of stomach ulcers    Hypertension    Marijuana abuse, continuous    Migraine    a few times/year (03/26/2015)   Pneumonia 1990's X 3   PTSD (post-traumatic stress disorder)    Sickle cell trait ([place])    WPW (Wolff-Parkinson-White syndrome)    [name] 03/06/2013   Past Surgical History:  Procedure Laterality Date   BIOPSY  11/25/2017   Procedure: BIOPSY;  Surgeon: Arta Silence, MD;  Location: [place];  Service: Endoscopy;;   BIOPSY  10/14/2018   Procedure: BIOPSY;  Surgeon: Arta Silence, MD;  Location: [facility];  Service: Endoscopy;;   CARDIAC CATHETERIZATION     CYST ENTEROSTOMY  01/02/2020   Procedure: CYST ASPIRATION;  Surgeon: Milus Banister, MD;  [facility];  Service: Endoscopy;;   ESOPHAGOGASTRODUODENOSCOPY (EGD) WITH PROPOFOL N/A 11/25/2017   Procedure: ESOPHAGOGASTRODUODENOSCOPY (EGD) WITH PROPOFOL;  Surgeon: Arta Silence, MD;  Location: [place];  Service: Endoscopy;  Laterality: N/A;   ESOPHAGOGASTRODUODENOSCOPY (EGD) WITH PROPOFOL Left 10/14/2018   Procedure:  ESOPHAGOGASTRODUODENOSCOPY (EGD) WITH PROPOFOL;  Surgeon: Arta Silence, MD;  [facility];  Service: Endoscopy;  Laterality: Left;   ESOPHAGOGASTRODUODENOSCOPY (EGD) WITH PROPOFOL N/A 11/14/2018   Procedure: ESOPHAGOGASTRODUODENOSCOPY (EGD) WITH PROPOFOL;  Surgeon: Laurence Spates, MD;  [facility];  Service: Gastroenterology;  Laterality: N/A;   ESOPHAGOGASTRODUODENOSCOPY (EGD) WITH PROPOFOL N/A 01/02/2020   Procedure: ESOPHAGOGASTRODUODENOSCOPY (EGD) WITH PROPOFOL;  Surgeon: Milus Banister, MD;  [facility];  Service: Endoscopy;  Laterality: N/A;   ESOPHAGOGASTRODUODENOSCOPY (EGD) WITH PROPOFOL N/A 10/25/2020   Procedure: ESOPHAGOGASTRODUODENOSCOPY (EGD) WITH PROPOFOL;  Surgeon: Rush Landmark Telford Nab., MD;  Location: [place];  Service: Gastroenterology;  Laterality: N/A;   EUS N/A 01/02/2020   Procedure: UPPER ENDOSCOPIC ULTRASOUND (EUS) RADIAL;  Surgeon: Milus Banister, MD;  [facility];  Service: Endoscopy;  Laterality: N/A;   EYE SURGERY Left 1990's   "result of trauma"    FACIAL FRACTURE SURGERY Left 1990's   "result of trauma"    FLEXIBLE SIGMOIDOSCOPY N/A 10/25/2020   Procedure: FLEXIBLE SIGMOIDOSCOPY;  Surgeon: Irving Copas., MD;  Location: [place];  Service: Gastroenterology;  Laterality: N/A;   FRACTURE SURGERY     HEMOSTASIS CLIP PLACEMENT  10/25/2020   Procedure: HEMOSTASIS CLIP PLACEMENT;  Surgeon: Irving Copas., MD;  Location: [place];  Service: Gastroenterology;;   HERNIA REPAIR     LEFT HEART CATHETERIZATION WITH CORONARY ANGIOGRAM Right 03/07/2013   Procedure: LEFT HEART CATHETERIZATION WITH CORONARY ANGIOGRAM;  Surgeon: [name], MD;  Location: [facility] CATH LAB;  Service: Cardiovascular;  Laterality: Right;   UMBILICAL HERNIA REPAIR     UPPER GASTROINTESTINAL ENDOSCOPY       A IV Location/Drains/Wounds Patient Lines/Drains/Airways Status     Active Line/Drains/Airways     Name Placement  date Placement time Site Days   Peripheral IV 07/20/22 20 G Anterior;Distal;Right;Upper Antecubital 07/20/22  1716  Antecubital  1   Peripheral IV 07/20/22 22 G 1.75" Anterior;Left;Proximal Forearm 07/20/22  1938  Forearm  1   External Urinary Catheter 07/21/22  0340  --  less than 1            Intake/Output Last 24 hours  Intake/Output Summary (Last 24 hours) at 07/21/2022 1048 Last data filed at 07/21/2022 0740 Gross per 24 hour  Intake 1302.07 ml  Output --  Net 1302.07 ml    Labs/Imaging Results for orders placed or performed during the hospital encounter of 07/19/22 (from the past 48 hour(s))  CBC with Differential     Status: Abnormal   Collection Time: 07/19/22  8:10 PM  Result Value Ref Range   WBC 8.3 4.0 - 10.5 K/uL   RBC 3.40 (L) 4.22 - 5.81 MIL/uL   Hemoglobin 10.7 (L) 13.0 - 17.0 g/dL   HCT 33.0 (L) 39.0 - 52.0 %   MCV 97.1 80.0 - 100.0 fL   MCH 31.5 26.0 - 34.0 pg   MCHC 32.4 30.0 -  36.0 g/dL   RDW 16.6 (H) 11.5 - 15.5 %   Platelets 132 (L) 150 - 400 K/uL   nRBC 0.2 0.0 - 0.2 %   Neutrophils Relative % 58 %   Neutro Abs 4.9 1.7 - 7.7 K/uL   Lymphocytes Relative 30 %   Lymphs Abs 2.5 0.7 - 4.0 K/uL   Monocytes Relative 10 %   Monocytes Absolute 0.8 0.1 - 1.0 K/uL   Eosinophils Relative 1 %   Eosinophils Absolute 0.1 0.0 - 0.5 K/uL   Basophils Relative 0 %   Basophils Absolute 0.0 0.0 - 0.1 K/uL   Immature Granulocytes 1 %   Abs Immature Granulocytes 0.12 (H) 0.00 - 0.07 K/uL    Comment: Performed at [facility] [address] [place], [place] [address]  Comprehensive metabolic panel     Status: Abnormal   Collection Time: 07/19/22  8:10 PM  Result Value Ref Range   Sodium 136 135 - 145 mmol/L   Potassium 3.6 3.5 - 5.1 mmol/L   Chloride 103 98 - 111 mmol/L   CO2 10 (L) 22 - 32 mmol/L   Glucose, Bld 172 (H) 70 - 99 mg/dL    Comment: Glucose reference range applies only to samples taken after fasting for at least 8 hours.   BUN <5 (L) 6 - 20  mg/dL   Creatinine, Ser 1.27 (H) 0.61 - 1.24 mg/dL   Calcium 5.9 (LL) 8.9 - 10.3 mg/dL    Comment: CRITICAL RESULT CALLED TO, READ BACK BY AND VERIFIED WITH A.COBB,RN. [id number] 07/19/22. LPAIT   Total Protein 7.2 6.5 - 8.1 g/dL   Albumin 3.9 3.5 - 5.0 g/dL   AST 175 (H) 15 - 41 U/L   ALT 56 (H) 0 - 44 U/L   Alkaline Phosphatase 96 38 - 126 U/L   Total Bilirubin 0.4 0.3 - 1.2 mg/dL   GFR, Estimated >60 >60 mL/min    Comment: (NOTE) Calculated using the CKD-EPI Creatinine Equation (2021)    Anion gap 23 (H) 5 - 15    Comment: ELECTROLYTES REPEATED TO VERIFY Performed at [facility] [address] [place], [place] [address]   Lipase, blood     Status: None   Collection Time: 07/19/22  8:10 PM  Result Value Ref Range   Lipase 27 11 - 51 U/L    Comment: Performed at [facility] [address], [place], [place] [address]  CK     Status: Abnormal   Collection Time: 07/19/22  8:10 PM  Result Value Ref Range   Total CK 634 (H) 49 - 397 U/L    Comment: Performed at [facility], [place] [address] [place], [place] [address]  TSH     Status: None   Collection Time: 07/19/22  8:10 PM  Result Value Ref Range   TSH 2.004 0.350 - 4.500 uIU/mL    Comment: Performed by a 3rd Generation assay with a functional sensitivity of <=0.01 uIU/mL. Performed at [facility], [place] [address], [place], [place] [address]   T4, free     Status: Abnormal   Collection Time: 07/19/22  8:10 PM  Result Value Ref Range   Free T4 0.59 (L) 0.61 - 1.12 ng/dL    Comment: (NOTE) Biotin ingestion may interfere with free T4 tests. If the results are inconsistent with the TSH level, previous test results, or the clinical presentation, then consider biotin interference. If needed, order repeat testing after  stopping biotin. Performed at [facility], [place] [address], [place], [place] [address]   T3, free     Status: None   Collection Time: 07/19/22  8:10 PM  Result Value Ref Range   T3,  Free 3.3 2.0 - 4.4 pg/mL    Comment: (NOTE) Performed At: [facility] [place], [place] [id number] Rush Farmer MD [id number]   Troponin I (High Sensitivity)     Status: None   Collection Time: 07/19/22  8:10 PM  Result Value Ref Range   Troponin I (High Sensitivity) 15 <18 ng/L    Comment: (NOTE) Elevated high sensitivity troponin I (hsTnI) values and significant  changes across serial measurements may suggest ACS but many other  chronic and acute conditions are known to elevate hsTnI results.  Refer to the "Links" section for chest pain algorithms and additional  guidance. Performed at [facility], [place] [address] [place], [place] [address]   Ethanol     Status: None   Collection Time: 07/19/22  8:10 PM  Result Value Ref Range   Alcohol, Ethyl (B) <10 <10 mg/dL    Comment: (NOTE) Lowest detectable limit for serum alcohol is 10 mg/dL.  For medical purposes only. Performed at [facility], [place] [address], [place], [place] [address]   Resp panel by RT-PCR (RSV, Flu A&B, Covid) Anterior Nasal Swab     Status: None   Collection Time: 07/19/22  8:18 PM   Specimen: Anterior Nasal Swab  Result Value Ref Range   SARS Coronavirus 2 by RT PCR NEGATIVE NEGATIVE   Influenza A by PCR NEGATIVE NEGATIVE   Influenza B by PCR NEGATIVE NEGATIVE    Comment: (NOTE) The Xpert Xpress SARS-CoV-2/FLU/RSV plus assay is intended as an aid in the diagnosis of influenza from Nasopharyngeal swab specimens and should not be used as a sole basis for treatment. Nasal washings and aspirates are unacceptable for Xpert Xpress SARS-CoV-2/FLU/RSV testing.  Fact Sheet for Patients: EntrepreneurPulse.com.au  Fact Sheet for Healthcare Providers: IncredibleEmployment.be  This test is not yet approved or cleared by the [place] FDA and has been authorized for detection and/or diagnosis of SARS-CoV-2 by FDA under an Emergency  Use Authorization (EUA). This EUA will remain in effect (meaning this test can be used) for the duration of the COVID-19 declaration under Section 564(b)(1) of the Act, 21 U.S.C. section 360bbb-3(b)(1), unless the authorization is terminated or revoked.     Resp Syncytial Virus by PCR NEGATIVE NEGATIVE    Comment: (NOTE) Fact Sheet for Patients: EntrepreneurPulse.com.au  Fact Sheet for Healthcare Providers: IncredibleEmployment.be  This test is not yet approved or cleared by the [place] FDA and has been authorized for detection and/or diagnosis of SARS-CoV-2 by FDA under an Emergency Use Authorization (EUA). This EUA will remain in effect (meaning this test can be used) for the duration of the COVID-19 declaration under Section 564(b)(1) of the Act, 21 U.S.C. section 360bbb-3(b)(1), unless the authorization is terminated or revoked.  Performed at [facility], [place] [address], [place], [place] [address]   Troponin I (High Sensitivity)     Status: Abnormal   Collection Time: 07/19/22 10:10 PM  Result Value Ref Range   Troponin I (High Sensitivity) 20 (H) <18 ng/L    Comment: (NOTE) Elevated high sensitivity troponin I (hsTnI) values and significant  changes across serial measurements may suggest ACS but many other  chronic and acute conditions are known to elevate hsTnI results.  Refer to the "Links" section for chest pain algorithms and additional  guidance. Performed at [facility], [place] [address] [place], [place] [id number]   Basic metabolic panel     Status: Abnormal   Collection Time: 07/20/22  3:24 AM  Result Value Ref Range   Sodium 141 135 - 145 mmol/L   Potassium 3.4 (L) 3.5 - 5.1 mmol/L   Chloride 115 (H) 98 - 111 mmol/L   CO2 12 (L) 22 - 32 mmol/L   Glucose, Bld 72 70 - 99 mg/dL    Comment: Glucose reference range applies only to samples taken after fasting for at least 8 hours.   BUN <5 (L) 6 - 20 mg/dL    Creatinine, Ser 0.76 0.61 - 1.24 mg/dL   Calcium 5.6 (LL) 8.9 - 10.3 mg/dL    Comment: CRITICAL RESULT CALLED TO, READ BACK BY AND VERIFIED WITH A.COBB,RN. [id number] 07/20/22. LPAIT   GFR, Estimated >60 >60 mL/min    Comment: (NOTE) Calculated using the CKD-EPI Creatinine Equation (2021)    Anion gap 14 5 - 15    Comment: Performed at [facility], [place] [address] [place], [place] [address]  I-Stat venous blood gas, ED     Status: Abnormal   Collection Time: 07/20/22  4:08 AM  Result Value Ref Range   pH, Ven 7.257 7.25 - 7.43   pCO2, Ven 26.4 (L) 44 - 60 mmHg   pO2, Ven 187 (H) 32 - 45 mmHg   Bicarbonate 11.7 (L) 20.0 - 28.0 mmol/L   TCO2 13 (L) 22 - 32 mmol/L   O2 Saturation 99 %   Acid-base deficit 14.0 (H) 0.0 - 2.0 mmol/L   Sodium 142 135 - 145 mmol/L   Potassium 3.6 3.5 - 5.1 mmol/L   Calcium, Ion 0.68 (LL) 1.15 - 1.40 mmol/L   HCT 36.0 (L) 39.0 - 52.0 %   Hemoglobin 12.2 (L) 13.0 - 17.0 g/dL   Sample type VENOUS    Comment NOTIFIED PHYSICIAN   CBC     Status: Abnormal   Collection Time: 07/20/22  5:15 AM  Result Value Ref Range   WBC 11.1 (H) 4.0 - 10.5 K/uL   RBC 3.86 (L) 4.22 - 5.81 MIL/uL   Hemoglobin 12.0 (L) 13.0 - 17.0 g/dL   HCT 37.5 (L) 39.0 - 52.0 %   MCV 97.2 80.0 - 100.0 fL   MCH 31.1 26.0 - 34.0 pg   MCHC 32.0 30.0 - 36.0 g/dL   RDW 16.9 (H) 11.5 - 15.5 %   Platelets 124 (L) 150 - 400 K/uL    Comment: REPEATED TO VERIFY   nRBC 0.0 0.0 - 0.2 %    Comment: Performed at [facility], [place] [address] [place], [place] [address]  Magnesium     Status: Abnormal   Collection Time: 07/20/22  5:15 AM  Result Value Ref Range   Magnesium 0.8 (LL) 1.7 - 2.4 mg/dL    Comment: CRITICAL RESULT CALLED TO, READ BACK BY AND VERIFIED WITH K.BRANCH,EMTP. [id number] 07/20/22. LPAIT Performed at [facility], [place] [address] [place], [place] [address]   VITAMIN D 25 Hydroxy (Vit-D Deficiency, Fractures)     Status: None   Collection Time: 07/20/22  8:22 AM  Result  Value Ref Range   Vit D, 25-Hydroxy 30.51 30 - 100 ng/mL    Comment: (NOTE) Vitamin D deficiency has been defined by the Institute of Medicine  and an Endocrine Society practice  guideline as a level of serum 25-OH  vitamin D less than 20 ng/mL (1,2). The Endocrine Society went on to  further define vitamin D insufficiency as a level between 21 and 29  ng/mL (2).  1. IOM (Institute of Medicine). 2010. Dietary reference intakes for  calcium and D. [place]: The [employer]. 2. Holick MF, Binkley [place], Bischoff-Ferrari HA, et al. Evaluation,  treatment, and prevention of vitamin D deficiency: an Endocrine  Society clinical practice guideline, JCEM. 2011 Jul; 96(7): 1911-30.  Performed at [facility], [place] [address] [place], [place] [address]   Phosphorus     Status: None   Collection Time: 07/20/22  8:22 AM  Result Value Ref Range   Phosphorus 4.1 2.5 - 4.6 mg/dL    Comment: Performed at [facility] [address], [place], [place] [address]  Lactic acid, plasma     Status: None   Collection Time: 07/20/22  8:22 AM  Result Value Ref Range   Lactic Acid, Venous 1.8 0.5 - 1.9 mmol/L    Comment: Performed at [facility] [address], [place], [place] [address]  Troponin I (High Sensitivity)     Status: None   Collection Time: 07/20/22 11:13 AM  Result Value Ref Range   Troponin I (High Sensitivity) 14 <18 ng/L    Comment: (NOTE) Elevated high sensitivity troponin I (hsTnI) values and significant  changes across serial measurements may suggest ACS but many other  chronic and acute conditions are known to elevate hsTnI results.  Refer to the "Links" section for chest pain algorithms and additional  guidance. Performed at [facility], [place] [address] [place], [place] [address]   Comprehensive metabolic panel     Status: Abnormal   Collection Time: 07/20/22  2:08 PM  Result Value Ref Range   Sodium 136 135 - 145 mmol/L   Potassium  3.1 (L) 3.5 - 5.1 mmol/L   Chloride 104 98 - 111 mmol/L   CO2 18 (L) 22 - 32 mmol/L   Glucose, Bld 202 (H) 70 - 99 mg/dL    Comment: Glucose reference range applies only to samples taken after fasting for at least 8 hours.   BUN <5 (L) 6 - 20 mg/dL   Creatinine, Ser 0.86 0.61 - 1.24 mg/dL   Calcium 7.0 (L) 8.9 - 10.3 mg/dL   Total Protein 6.0 (L) 6.5 - 8.1 g/dL   Albumin 3.2 (L) 3.5 - 5.0 g/dL   AST 115 (H) 15 - 41 U/L   ALT 44 0 - 44 U/L   Alkaline Phosphatase 84 38 - 126 U/L   Total Bilirubin 0.5 0.3 - 1.2 mg/dL   GFR, Estimated >60 >60 mL/min    Comment: (NOTE) Calculated using the CKD-EPI Creatinine Equation (2021)    Anion gap 14 5 - 15    Comment: Performed at [facility], [place] [address] [place], [place] [id number]  Basic metabolic panel     Status: Abnormal   Collection Time: 07/20/22  5:14 PM  Result Value Ref Range   Sodium 137 135 - 145 mmol/L   Potassium 3.3 (L) 3.5 - 5.1 mmol/L   Chloride 106 98 - 111 mmol/L   CO2 19 (L) 22 - 32 mmol/L   Glucose, Bld 156 (H) 70 - 99 mg/dL    Comment: Glucose reference range applies only to samples taken after fasting for at least 8 hours.   BUN <5 (L) 6 -  20 mg/dL   Creatinine, Ser 0.91 0.61 - 1.24 mg/dL   Calcium 7.1 (L) 8.9 - 10.3 mg/dL   GFR, Estimated >60 >60 mL/min    Comment: (NOTE) Calculated using the CKD-EPI Creatinine Equation (2021)    Anion gap 12 5 - 15    Comment: Performed at [facility] [address], [place], [place] [address]  Magnesium     Status: None   Collection Time: 07/20/22  5:14 PM  Result Value Ref Range   Magnesium 1.7 1.7 - 2.4 mg/dL    Comment: Performed at [facility] [address], [place], [place] [address]  Phosphorus     Status: None   Collection Time: 07/20/22  5:14 PM  Result Value Ref Range   Phosphorus 3.6 2.5 - 4.6 mg/dL    Comment: Performed at [facility] [address] [place], [place] [address]  CBC     Status: Abnormal   Collection Time:  07/21/22  8:07 AM  Result Value Ref Range   WBC 9.7 4.0 - 10.5 K/uL   RBC 2.84 (L) 4.22 - 5.81 MIL/uL   Hemoglobin 9.1 (L) 13.0 - 17.0 g/dL   HCT 28.3 (L) 39.0 - 52.0 %   MCV 99.6 80.0 - 100.0 fL   MCH 32.0 26.0 - 34.0 pg   MCHC 32.2 30.0 - 36.0 g/dL   RDW 17.7 (H) 11.5 - 15.5 %   Platelets 99 (L) 150 - 400 K/uL    Comment: REPEATED TO VERIFY   nRBC 0.0 0.0 - 0.2 %    Comment: Performed at [facility] [address], [place], [place] [address]  Comprehensive metabolic panel     Status: Abnormal   Collection Time: 07/21/22  8:07 AM  Result Value Ref Range   Sodium 138 135 - 145 mmol/L   Potassium 4.7 3.5 - 5.1 mmol/L    Comment: DELTA CHECK NOTED   Chloride 109 98 - 111 mmol/L   CO2 19 (L) 22 - 32 mmol/L   Glucose, Bld 101 (H) 70 - 99 mg/dL    Comment: Glucose reference range applies only to samples taken after fasting for at least 8 hours.   BUN <5 (L) 6 - 20 mg/dL   Creatinine, Ser 0.66 0.61 - 1.24 mg/dL   Calcium 7.2 (L) 8.9 - 10.3 mg/dL   Total Protein 6.2 (L) 6.5 - 8.1 g/dL   Albumin 3.3 (L) 3.5 - 5.0 g/dL   AST 99 (H) 15 - 41 U/L   ALT 48 (H) 0 - 44 U/L   Alkaline Phosphatase 87 38 - 126 U/L   Total Bilirubin 0.9 0.3 - 1.2 mg/dL   GFR, Estimated >60 >60 mL/min    Comment: (NOTE) Calculated using the CKD-EPI Creatinine Equation (2021)    Anion gap 10 5 - 15    Comment: Performed at [facility], [place] [address], [place], [place] [address]  Magnesium     Status: Abnormal   Collection Time: 07/21/22  8:07 AM  Result Value Ref Range   Magnesium 1.6 (L) 1.7 - 2.4 mg/dL    Comment: Performed at [facility] [address], [place],  [address]  Phosphorus     Status: Abnormal   Collection Time: 07/21/22  8:07 AM  Result Value Ref Range   Phosphorus 2.4 (L) 2.5 - 4.6 mg/dL    Comment: Performed at [facility] [address] [place], [place]  [id number]   EEG adult  Result Date: 07/20/2022 Lora Havens, MD     07/20/2022 10:11 AM  Patient Name: Dareus Sundet MRN: [id number] Epilepsy Attending: Lora Havens Referring Physician/Provider: Kayleen Memos, DO Date: 313/2024 Duration: 24.36 mins Patient history: 52yo M with seizure in setting of alcohol withdrawal. EEG to evaluate for seizure Level of alertness: Awake AEDs during EEG study: Ativan Technical aspects: This EEG study was done with scalp electrodes positioned according to the 10-20 International system of electrode placement. Electrical activity was reviewed with band pass filter of 1-'70Hz'$ , sensitivity of 7 uV/mm, display speed of 48m/sec with a '60Hz'$  notched filter applied as appropriate. EEG data were recorded continuously and digitally stored.  Video monitoring was available and reviewed as appropriate. Description: The posterior dominant rhythm consists of 9 Hz activity of moderate voltage (25-35 uV) seen predominantly in posterior head regions, symmetric and reactive to eye opening and eye closing. There is an excessive amount of 15 to 18 Hz beta activity distributed symmetrically and diffusely. Physiologic photic driving was seen during photic stimulation.  Hyperventilation was not performed.   ABNORMALITY - Excessive beta, generalized IMPRESSION: This study is within normal limits. The excessive beta activity seen in the background is most likely due to the effect of benzodiazepine and is a benign EEG pattern. No seizures or epileptiform discharges were seen throughout the recording. PLora Havens  CT Head Wo Contrast  Result Date: 07/19/2022 CLINICAL DATA:  Seizure, head trauma EXAM: CT HEAD WITHOUT CONTRAST TECHNIQUE: Contiguous axial images were obtained from the base of the skull through the vertex without intravenous contrast. RADIATION DOSE REDUCTION: This exam was performed according to the departmental dose-optimization program which includes automated exposure control, adjustment of the mA and/or kV according to patient size and/or use of iterative  reconstruction technique. COMPARISON:  05/12/2021 FINDINGS: Brain: No acute infarct or hemorrhage. Lateral ventricles and midline structures are unremarkable. No acute extra-axial fluid collections. No mass effect. Vascular: No hyperdense vessel or unexpected calcification. Skull: Normal. Negative for fracture or focal lesion. Sinuses/Orbits: Chronic posttraumatic and postsurgical changes of the inferior and medial left orbital wall unchanged. Paranasal sinuses are clear. Other: None. IMPRESSION: 1. Stable head CT, no acute process. Electronically Signed   By: MRanda NgoM.D.   On: 07/19/2022 22:37   CT ABDOMEN PELVIS WO CONTRAST  Result Date: 07/19/2022 CLINICAL DATA:  Epigastric pain. Patient arrives to ED for seizure activity. Patient postictal and altered on scene per EMS. EXAM: CT ABDOMEN AND PELVIS WITHOUT CONTRAST TECHNIQUE: Multidetector CT imaging of the abdomen and pelvis was performed following the standard protocol without IV contrast. RADIATION DOSE REDUCTION: This exam was performed according to the departmental dose-optimization program which includes automated exposure control, adjustment of the mA and/or kV according to patient size and/or use of iterative reconstruction technique. COMPARISON:  CT 05/28/2022 FINDINGS: Lower chest: No acute abnormality. Hepatobiliary: Unremarkable liver, gallbladder, and biliary tree. Pancreas: Coarse calcifications in the pancreatic head and body compatible with sequela of chronic pancreatitis. Low-density fluid collection in the pancreatic tail measuring 2.2 cm is likely a pseudocyst. Additional pseudocyst in the distal pancreatic tail measuring 1.1 cm. Spleen: Unremarkable. Adrenals/Urinary Tract: Normal adrenal glands. No urinary calculi or hydronephrosis. Unremarkable bladder. Stomach/Bowel: Normal caliber large and small bowel. No bowel wall thickening. Unremarkable stomach. Normal appendix. Vascular/Lymphatic: Aortic atherosclerosis. No enlarged  abdominal or pelvic lymph nodes. Reproductive: Unremarkable. Other: No free intraperitoneal air.  No abdominal wall hernia. Musculoskeletal: No acute or  significant osseous findings. IMPRESSION: 1. No acute abnormality identified in the abdomen or pelvis. 2. 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 Aortic Atherosclerosis (ICD10-I70.0). Electronically Signed   By: Placido Sou M.D.   On: 07/19/2022 22:07   DG Chest 1 View  Result Date: 07/19/2022 CLINICAL DATA:  Seizures, Tachy EXAM: CHEST  1 VIEW COMPARISON:  07/08/2022 FINDINGS: Lungs are clear. Heart size and mediastinal contours are within normal limits. No effusion. Visualized bones unremarkable. IMPRESSION: No acute cardiopulmonary disease. Electronically Signed   By: Lucrezia Europe M.D.   On: 07/19/2022 20:14    Pending Labs Unresulted Labs (From admission, onward)     Start     Ordered   07/27/22 0500  Creatinine, serum  (enoxaparin (LOVENOX)    CrCl >/= 30 ml/min)  Weekly,   R     Comments: while on enoxaparin therapy    07/20/22 0444   07/20/22 0735  Parathyroid hormone, intact (no Ca)  Once,   R        07/20/22 0734   07/20/22 0735  Calcitriol (1,25 di-OH Vit D)  Once,   R        07/20/22 0734   07/20/22 0209  Blood gas, venous  Once,   R        07/20/22 0208   07/19/22 1956  Rapid urine drug screen (hospital performed)  ONCE - STAT,   STAT        07/19/22 1955   07/19/22 1954  Urinalysis, Routine w reflex microscopic -Urine, Clean Catch  (ED Abdominal Pain)  Once,   URGENT       Question:  Specimen Source  Answer:  Urine, Clean Catch   07/19/22 1955            Vitals/Pain Today's Vitals   07/21/22 0942 07/21/22 1000 07/21/22 1032 07/21/22 1036  BP: (!) 143/112 (!) 131/98  (!) 131/98  Pulse: (!) 144 (!) 127  (!) 115  Resp:  20    Temp:      TempSrc:      SpO2:  98%    Weight:      Height:      PainSc:   Asleep     Isolation Precautions Airborne and Contact  precautions  Medications Medications  thiamine (VITAMIN B1) tablet 100 mg (100 mg Oral Given 07/21/22 0934)    Or  thiamine (VITAMIN B1) injection 100 mg ( Intravenous See Alternative [id number])  folic acid (FOLVITE) tablet 1 mg (1 mg Oral Given 07/21/22 0935)  multivitamin with minerals tablet 1 tablet (1 tablet Oral Given 07/21/22 0934)  enoxaparin (LOVENOX) injection 40 mg (40 mg Subcutaneous Given 07/21/22 0935)  LORazepam (ATIVAN) injection 2 mg (has no administration in time range)  polyethylene glycol (MIRALAX / GLYCOLAX) packet 17 g (has no administration in time range)  prochlorperazine (COMPAZINE) injection 5 mg (has no administration in time range)  melatonin tablet 5 mg (has no administration in time range)  albuterol (PROVENTIL) (2.5 MG/3ML) 0.083% nebulizer solution 3 mL (has no administration in time range)  DULoxetine (CYMBALTA) DR capsule 60 mg (60 mg Oral Given 07/20/22 2224)  lipase/protease/amylase (CREON) capsule 36,000 Units (36,000 Units Oral Given 07/21/22 0756)  pantoprazole (PROTONIX) EC tablet 40 mg (40 mg Oral Given 07/21/22 0934)  sucralfate (CARAFATE) 1 GM/10ML suspension 1 g (1 g Oral Not Given 07/21/22 0800)  calcitRIOL (ROCALTROL) capsule 0.5 mcg (0.5 mcg Oral Given 07/21/22 0935)  calcium-vitamin D (OSCAL WITH D)  500-5 MG-MCG per tablet 1 tablet (1 tablet Oral Given 07/21/22 0756)  sodium bicarbonate 150 mEq in dextrose 5 % 1,150 mL infusion ( Intravenous Stopped 07/20/22 1655)  dicyclomine (BENTYL) capsule 10 mg (10 mg Oral Given 07/21/22 0935)  HYDROcodone-acetaminophen (NORCO/VICODIN) 5-325 MG per tablet 1-2 tablet (2 tablets Oral Given 07/21/22 0946)  amiodarone (NEXTERONE) 1.8 mg/mL load via infusion 150 mg (150 mg Intravenous Bolus from Bag 07/20/22 1043)    Followed by  amiodarone (NEXTERONE PREMIX) 360-4.14 MG/200ML-% (1.8 mg/mL) IV infusion (0 mg/hr Intravenous Stopped 07/20/22 1655)    Followed by  amiodarone (NEXTERONE PREMIX) 360-4.14 MG/200ML-% (1.8  mg/mL) IV infusion (30 mg/hr Intravenous Infusion Verify 07/21/22 0740)  metoprolol tartrate (LOPRESSOR) tablet 25 mg (25 mg Oral Given 07/21/22 0934)  alum & mag hydroxide-simeth (MAALOX/MYLANTA) 200-200-20 MG/5ML suspension 30 mL (30 mLs Oral Given 07/20/22 1259)  LORazepam (ATIVAN) tablet 1-4 mg (2 mg Oral Given 07/21/22 0947)    Or  LORazepam (ATIVAN) injection 1-4 mg ( Intravenous See Alternative 07/21/22 0947)  LORazepam (ATIVAN) injection 0-4 mg (0 mg Intravenous Hold 07/21/22 1036)    Followed by  LORazepam (ATIVAN) injection 0-4 mg (has no administration in time range)  metoprolol tartrate (LOPRESSOR) injection 5 mg (has no administration in time range)  HYDROmorphone (DILAUDID) injection 1-2 mg (has no administration in time range)  sodium chloride 0.9 % bolus 1,000 mL (0 mLs Intravenous Stopped 07/20/22 0017)  sodium chloride 0.9 % bolus 1,000 mL (0 mLs Intravenous Stopped 07/20/22 0241)  famotidine (PEPCID) IVPB 20 mg premix (0 mg Intravenous Stopped 07/19/22 2310)  ketorolac (TORADOL) 30 MG/ML injection 30 mg (30 mg Intravenous Given 07/19/22 2131)  amLODipine (NORVASC) tablet 10 mg (10 mg Oral Given 07/19/22 2130)  LORazepam (ATIVAN) tablet 2 mg (2 mg Oral Given 07/19/22 2130)  calcium gluconate 1 g/ 50 mL sodium chloride IVPB (0 mg Intravenous Stopped 07/20/22 0017)  metoprolol tartrate (LOPRESSOR) injection 5 mg (5 mg Intravenous Given 07/20/22 0003)  sodium chloride 0.9 % bolus 1,000 mL (0 mLs Intravenous Stopped 07/20/22 0202)  calcium gluconate 2 g/ 100 mL sodium chloride IVPB (0 mg Intravenous Stopped 07/20/22 0559)  magnesium sulfate IVPB 2 g 50 mL (0 g Intravenous Stopped 07/20/22 0618)  sodium bicarbonate injection 100 mEq (100 mEq Intravenous Given 07/20/22 0459)  potassium chloride SA (KLOR-CON M) CR tablet 40 mEq (40 mEq Oral Given 07/20/22 1737)  metoprolol tartrate (LOPRESSOR) injection 2.5 mg (2.5 mg Intravenous Given 07/20/22 0555)  sodium chloride 0.9 % bolus 500 mL (0 mLs  Intravenous Stopped 07/20/22 0750)  metoprolol tartrate (LOPRESSOR) injection 2.5 mg (2.5 mg Intravenous Given 07/20/22 0623)  midodrine (PROAMATINE) tablet 10 mg (10 mg Oral Given 07/20/22 0810)  calcium gluconate 2 g/ 100 mL sodium chloride IVPB (0 mg Intravenous Stopped 07/20/22 1445)  magnesium sulfate IVPB 4 g 100 mL (0 g Intravenous Stopped 07/20/22 1023)  potassium chloride SA (KLOR-CON M) CR tablet 40 mEq (40 mEq Oral Given 07/20/22 2225)  magnesium sulfate IVPB 2 g 50 mL (0 g Intravenous Stopped 07/20/22 2103)    Mobility walks     Focused Assessments Neuro Assessment Handoff:  Swallow screen pass? Yes  Cardiac Rhythm: Atrial fibrillation       Neuro Assessment: Exceptions to [facility] Neuro Checks:      Has TPA been given? No If patient is a Neuro Trauma and patient is going to OR before floor call report to [place] nurse: [phone] or [phone]   R Recommendations: See Admitting Provider  Note  Report given to:   Additional Notes: Pt was in restraints overnight, got violent with night nurse. No issues with me today.

## 2022-07-21 NOTE — Progress Notes (Signed)
PROGRESS NOTE        PATIENT DETAILS Name: [name] Age: 52 y.o. Sex: male Date of Birth: June 21, 1970 Admit Date: 07/19/2022 Admitting Physician Kayleen Memos, DO PCP:Pcp, No  Brief Summary: Patient is a 52 y.o.  male history of PAF, history of WPW syndrome s/p ablation, EtOH use, chronic pancreatitis secondary to EtOH use, bipolar disorder, chronic anxiety/depression, cocaine use-who presented to the ED with seizures.  Significant events: 3/12>> admitted to [facility]-brought in by EMS for seizures. 3/13>>Afib RVR-BP soft-amiodarone gtt-cards consulted  Significant studies: 3/12>> CXR: No PNA 3/12>> CT head: No acute process 3/12>> CT abdomen/pelvis: No acute abnormality in the abdomen/pelvis.  Sequelae of chronic pancreatitis with unchanged pseudocyst. 3/13>> EEG: No seizures  Significant microbiology data: 3/12>> COVID/influenza/RSV PCR: Negative  Procedures: None  Consults: Cardiology  Subjective: Has had several episodes of agitation-throughout the night.  No other issues.  Remains in A-fib and on amiodarone infusion  Objective: Vitals: Blood pressure 114/86, pulse (!) 120, temperature 97.8 F (36.6 C), resp. rate 14, height '5\' 9"'$  (1.753 m), weight 61.2 kg, SpO2 98 %.   Exam: Gen Exam:Alert awake-not in any distress HEENT:atraumatic, normocephalic Chest: B/L clear to auscultation anteriorly CVS:S1S2 regular Abdomen:soft non tender, non distended Extremities:no edema Neurology: Non focal Skin: no rash  Pertinent Labs/Radiology:    Latest Ref Rng & Units 07/21/2022    8:07 AM 07/20/2022    5:15 AM 07/20/2022    4:08 AM  CBC  WBC 4.0 - 10.5 K/uL 9.7  11.1    Hemoglobin 13.0 - 17.0 g/dL 9.1  12.0  12.2   Hematocrit 39.0 - 52.0 % 28.3  37.5  36.0   Platelets 150 - 400 K/uL 99  124      Lab Results  Component Value Date   NA 138 07/21/2022   K 4.7 07/21/2022   CL 109 07/21/2022   CO2 19 (L) 07/21/2022       Assessment/Plan: Seizure disorder In the setting of alcohol use/cocaine use Poor historian but claims last drink was around 3-5 days prior to his ED presentation Continue Ativan per CIWA protocol CT head unremarkable-EEG negative for seizures-no indication for AEDs at this point  EtOH use/withdrawal Has episodes of agitation/confusion since yesterday Continue Ativan per CIWA protocol  A-fib RVR Rate better controlled-on scheduled beta-blockers and amiodarone infusion Cardiology following.    Hypokalemia Due to EtOH use and severe hypomagnesemia Repleted  Hypomagnesemia Likely due to EtOH use Improved-but still on the lower side-repeat 4 g of IV magnesium sulfate today Follow electrolytes   Hypocalcemia Likely due to hypomagnesemia Improving-received several doses of IV calcium gluconate on admission For now continue with oral calcitriol and Os-Cal Follow electrolytes-await vitamin D/PTH levels.   Non anion gap metabolic acidosis Suspect this is from intermittent diarrhea in the setting of chronic pancreatitis Improved with IV fluid hydration with bicarb Will stop IVF and start on oral bicarb supplementation Follow electrolytes.    Alcoholic hepatitis Mild Follow LFTs  Rhabdomyolysis Mild-likely in the setting of seizures Follow CK  History of WPW syndrome s/p ablation Telemetry monitoring  Chronic pancreatitis Chronic pain syndrome Has chronic abdominal pain Continue Creon Continue as needed narcotics  Anxiety/depression Relatively stable Continue Cymbalta  Cocaine use/EtOH use Poor historian but acknowledges cocaine use approximately 2 days back, EtOH use-last drink around 3-5 days back Continue Ativan per CIWA  protocol Will need counseling prior to discharge  Underweight: Estimated body mass index is 19.94 kg/m as calculated from the following:   Height as of this encounter: '5\' 9"'$  (1.753 m).   Weight as of this encounter: 61.2 kg.   Code  status:   Code Status: Full Code   DVT Prophylaxis: enoxaparin (LOVENOX) injection 40 mg Start: 07/20/22 1000   Family Communication: None at bedside   Disposition Plan: Status is: Inpatient Remains inpatient appropriate because: Severity of illness   Planned Discharge Destination:Home   Diet: Diet Order             Diet Heart Room service appropriate? Yes; Fluid consistency: Thin  Diet effective now                     Antimicrobial agents: Anti-infectives (From admission, onward)    None        MEDICATIONS: Scheduled Meds:  calcitRIOL  0.5 mcg Oral Daily   calcium-vitamin D  1 tablet Oral BID   dicyclomine  10 mg Oral BID   DULoxetine  60 mg Oral QHS   enoxaparin (LOVENOX) injection  40 mg Subcutaneous Daily   folic acid  1 mg Oral Daily   lipase/protease/amylase  36,000 Units Oral TID AC   LORazepam  0-4 mg Intravenous Q6H   Followed by   Derrill Memo ON 07/22/2022] LORazepam  0-4 mg Intravenous Q12H   metoprolol tartrate  25 mg Oral QID   multivitamin with minerals  1 tablet Oral Daily   pantoprazole  40 mg Oral BID   sucralfate  1 g Oral TID WC & HS   thiamine  100 mg Oral Daily   Or   thiamine  100 mg Intravenous Daily   Continuous Infusions:  amiodarone 30 mg/hr (07/21/22 0740)   magnesium sulfate bolus IVPB     PRN Meds:.albuterol, alum & mag hydroxide-simeth, HYDROcodone-acetaminophen, HYDROmorphone (DILAUDID) injection, LORazepam **OR** LORazepam, LORazepam, melatonin, metoprolol tartrate, polyethylene glycol, prochlorperazine   I have personally reviewed following labs and imaging studies  LABORATORY DATA: CBC: Recent Labs  Lab 07/19/22 2010 07/20/22 0408 07/20/22 0515 07/21/22 0807  WBC 8.3  --  11.1* 9.7  NEUTROABS 4.9  --   --   --   HGB 10.7* 12.2* 12.0* 9.1*  HCT 33.0* 36.0* 37.5* 28.3*  MCV 97.1  --  97.2 99.6  PLT 132*  --  124* 99*     Basic Metabolic Panel: Recent Labs  Lab 07/19/22 2010 07/20/22 0324 07/20/22 0408  07/20/22 0515 07/20/22 0822 07/20/22 1408 07/20/22 1714 07/21/22 0807  NA 136 141 142  --   --  136 137 138  K 3.6 3.4* 3.6  --   --  3.1* 3.3* 4.7  CL 103 115*  --   --   --  104 106 109  CO2 10* 12*  --   --   --  18* 19* 19*  GLUCOSE 172* 72  --   --   --  202* 156* 101*  BUN <5* <5*  --   --   --  <5* <5* <5*  CREATININE 1.27* 0.76  --   --   --  0.86 0.91 0.66  CALCIUM 5.9* 5.6*  --   --   --  7.0* 7.1* 7.2*  MG  --   --   --  0.8*  --   --  1.7 1.6*  PHOS  --   --   --   --  4.1  --  3.6 2.4*     GFR: Estimated Creatinine Clearance: 92.4 mL/min (by C-G formula based on SCr of 0.66 mg/dL).  Liver Function Tests: Recent Labs  Lab 07/19/22 2010 07/20/22 1408 07/21/22 0807  AST 175* 115* 99*  ALT 56* 44 48*  ALKPHOS 96 84 87  BILITOT 0.4 0.5 0.9  PROT 7.2 6.0* 6.2*  ALBUMIN 3.9 3.2* 3.3*    Recent Labs  Lab 07/19/22 2010  LIPASE 27    No results for input(s): "AMMONIA" in the last 168 hours.  Coagulation Profile: No results for input(s): "INR", "PROTIME" in the last 168 hours.  Cardiac Enzymes: Recent Labs  Lab 07/19/22 2010  CKTOTAL 634*     BNP (last 3 results) No results for input(s): "PROBNP" in the last 8760 hours.  Lipid Profile: No results for input(s): "CHOL", "HDL", "LDLCALC", "TRIG", "CHOLHDL", "LDLDIRECT" in the last 72 hours.  Thyroid Function Tests: Recent Labs    07/19/22 2010  TSH 2.004  FREET4 0.59*  T3FREE 3.3     Anemia Panel: No results for input(s): "VITAMINB12", "FOLATE", "FERRITIN", "TIBC", "IRON", "RETICCTPCT" in the last 72 hours.  Urine analysis:    Component Value Date/Time   COLORURINE COLORLESS (A) 05/28/2022 0343   APPEARANCEUR CLEAR 05/28/2022 0343   LABSPEC 1.005 05/28/2022 0343   PHURINE 5.0 05/28/2022 0343   GLUCOSEU NEGATIVE 05/28/2022 0343   HGBUR NEGATIVE 05/28/2022 0343   HGBUR negative 04/30/2010 1020   BILIRUBINUR NEGATIVE 05/28/2022 0343   BILIRUBINUR negative 09/06/2019 1649   KETONESUR  NEGATIVE 05/28/2022 0343   PROTEINUR NEGATIVE 05/28/2022 0343   UROBILINOGEN 0.2 09/06/2019 1649   UROBILINOGEN 0.2 03/15/2015 0508   NITRITE NEGATIVE 05/28/2022 0343   LEUKOCYTESUR NEGATIVE 05/28/2022 0343    Sepsis Labs: Lactic Acid, Venous    Component Value Date/Time   LATICACIDVEN 1.8 07/20/2022 [id number]    MICROBIOLOGY: Recent Results (from the past 240 hour(s))  Resp panel by RT-PCR (RSV, Flu A&B, Covid) Anterior Nasal Swab     Status: None   Collection Time: 07/19/22  8:18 PM   Specimen: Anterior Nasal Swab  Result Value Ref Range Status   SARS Coronavirus 2 by RT PCR NEGATIVE NEGATIVE Final   Influenza A by PCR NEGATIVE NEGATIVE Final   Influenza B by PCR NEGATIVE NEGATIVE Final    Comment: (NOTE) The Xpert Xpress SARS-CoV-2/FLU/RSV plus assay is intended as an aid in the diagnosis of influenza from Nasopharyngeal swab specimens and should not be used as a sole basis for treatment. Nasal washings and aspirates are unacceptable for Xpert Xpress SARS-CoV-2/FLU/RSV testing.  Fact Sheet for Patients: EntrepreneurPulse.com.au  Fact Sheet for Healthcare Providers: IncredibleEmployment.be  This test is not yet approved or cleared by the [place] FDA and has been authorized for detection and/or diagnosis of SARS-CoV-2 by FDA under an Emergency Use Authorization (EUA). This EUA will remain in effect (meaning this test can be used) for the duration of the COVID-19 declaration under Section 564(b)(1) of the Act, 21 U.S.C. section 360bbb-3(b)(1), unless the authorization is terminated or revoked.     Resp Syncytial Virus by PCR NEGATIVE NEGATIVE Final    Comment: (NOTE) Fact Sheet for Patients: EntrepreneurPulse.com.au  Fact Sheet for Healthcare Providers: IncredibleEmployment.be  This test is not yet approved or cleared by the [place] FDA and has been authorized for detection and/or  diagnosis of SARS-CoV-2 by FDA under an Emergency Use Authorization (EUA). This EUA will remain in effect (meaning this test can be used) for the  duration of the COVID-19 declaration under Section 564(b)(1) of the Act, 21 U.S.C. section 360bbb-3(b)(1), unless the authorization is terminated or revoked.  Performed at [facility], [place] [address] [place], [place] [address]     RADIOLOGY STUDIES/RESULTS: EEG adult  Result Date: 07/20/2022 Lora Havens, MD     07/20/2022 10:11 AM Patient Name: Ediz Neylon MRN: [id number] Epilepsy Attending: Lora Havens Referring Physician/Provider: Kayleen Memos, DO Date: 313/2024 Duration: 24.36 mins Patient history: 52yo M with seizure in setting of alcohol withdrawal. EEG to evaluate for seizure Level of alertness: Awake AEDs during EEG study: Ativan Technical aspects: This EEG study was done with scalp electrodes positioned according to the 10-20 International system of electrode placement. Electrical activity was reviewed with band pass filter of 1-'70Hz'$ , sensitivity of 7 uV/mm, display speed of 41m/sec with a '60Hz'$  notched filter applied as appropriate. EEG data were recorded continuously and digitally stored.  Video monitoring was available and reviewed as appropriate. Description: The posterior dominant rhythm consists of 9 Hz activity of moderate voltage (25-35 uV) seen predominantly in posterior head regions, symmetric and reactive to eye opening and eye closing. There is an excessive amount of 15 to 18 Hz beta activity distributed symmetrically and diffusely. Physiologic photic driving was seen during photic stimulation.  Hyperventilation was not performed.   ABNORMALITY - Excessive beta, generalized IMPRESSION: This study is within normal limits. The excessive beta activity seen in the background is most likely due to the effect of benzodiazepine and is a benign EEG pattern. No seizures or epileptiform discharges were seen throughout  the recording. PLora Havens  CT Head Wo Contrast  Result Date: 07/19/2022 CLINICAL DATA:  Seizure, head trauma EXAM: CT HEAD WITHOUT CONTRAST TECHNIQUE: Contiguous axial images were obtained from the base of the skull through the vertex without intravenous contrast. RADIATION DOSE REDUCTION: This exam was performed according to the departmental dose-optimization program which includes automated exposure control, adjustment of the mA and/or kV according to patient size and/or use of iterative reconstruction technique. COMPARISON:  05/12/2021 FINDINGS: Brain: No acute infarct or hemorrhage. Lateral ventricles and midline structures are unremarkable. No acute extra-axial fluid collections. No mass effect. Vascular: No hyperdense vessel or unexpected calcification. Skull: Normal. Negative for fracture or focal lesion. Sinuses/Orbits: Chronic posttraumatic and postsurgical changes of the inferior and medial left orbital wall unchanged. Paranasal sinuses are clear. Other: None. IMPRESSION: 1. Stable head CT, no acute process. Electronically Signed   By: MRanda NgoM.D.   On: 07/19/2022 22:37   CT ABDOMEN PELVIS WO CONTRAST  Result Date: 07/19/2022 CLINICAL DATA:  Epigastric pain. Patient arrives to ED for seizure activity. Patient postictal and altered on scene per EMS. EXAM: CT ABDOMEN AND PELVIS WITHOUT CONTRAST TECHNIQUE: Multidetector CT imaging of the abdomen and pelvis was performed following the standard protocol without IV contrast. RADIATION DOSE REDUCTION: This exam was performed according to the departmental dose-optimization program which includes automated exposure control, adjustment of the mA and/or kV according to patient size and/or use of iterative reconstruction technique. COMPARISON:  CT 05/28/2022 FINDINGS: Lower chest: No acute abnormality. Hepatobiliary: Unremarkable liver, gallbladder, and biliary tree. Pancreas: Coarse calcifications in the pancreatic head and body compatible with  sequela of chronic pancreatitis. Low-density fluid collection in the pancreatic tail measuring 2.2 cm is likely a pseudocyst. Additional pseudocyst in the distal pancreatic tail measuring 1.1 cm. Spleen: Unremarkable. Adrenals/Urinary Tract: Normal adrenal glands. No urinary calculi or hydronephrosis. Unremarkable bladder. Stomach/Bowel: Normal caliber  large and small bowel. No bowel wall thickening. Unremarkable stomach. Normal appendix. Vascular/Lymphatic: Aortic atherosclerosis. No enlarged abdominal or pelvic lymph nodes. Reproductive: Unremarkable. Other: No free intraperitoneal air.  No abdominal wall hernia. Musculoskeletal: No acute or significant osseous findings. IMPRESSION: 1. No acute abnormality identified in the abdomen or pelvis. 2. 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 Aortic Atherosclerosis (ICD10-I70.0). Electronically Signed   By: Placido Sou M.D.   On: 07/19/2022 22:07   DG Chest 1 View  Result Date: 07/19/2022 CLINICAL DATA:  Seizures, Tachy EXAM: CHEST  1 VIEW COMPARISON:  07/08/2022 FINDINGS: Lungs are clear. Heart size and mediastinal contours are within normal limits. No effusion. Visualized bones unremarkable. IMPRESSION: No acute cardiopulmonary disease. Electronically Signed   By: Lucrezia Europe M.D.   On: 07/19/2022 20:14     LOS: 1 day   Oren Binet, MD  [facility]    To contact the attending provider between 7A-7P or the covering provider during after hours 7P-7A, please log into the web site [email or web address] and access using universal [place] password for that web site. If you do not have the password, please call the hospital operator.  07/21/2022, 11:20 AM

## 2022-07-21 NOTE — ED Notes (Signed)
Pt found standing next to bed near trash. Pt redirected into bed. Pt requesting pain meds.

## 2022-07-21 NOTE — Progress Notes (Signed)
CSW received consult for substance use resources. CSW spoke with patient at bedside. Patient reports PTA he comes from home with mother. CSW offered patient outpatient substance use treatment services resources. Patient accepted. Patient reports he may need transportation when medically ready for dc. TOC will continue to follow and will assist with transportation needs if needed at time of dc. All questions answered. No further questions reported at this time.

## 2022-07-21 NOTE — Progress Notes (Signed)
Assumed care of patient. Required frequent redirection for noncompliant and unsafe behavior including frequently getting out of bed despite education provided of fall risk. Pt on amio gtt. Converted to NSR. Confirmed with central tele monitoring. Paged On call cardiologist. MD order to  continue drip at same rate. Pt tolerating treatment very well.

## 2022-07-21 NOTE — ED Notes (Signed)
Pt came out of the room with his IV pole in his hand and wanted a soda. RN tried to redirect pt back to the room due to pt being off the monitor and having an IV in his arm with medication running at the time. Pt immediately became combative and shouting verbal insults. Pt tried to throw IV pole at the RN and other staff assisted pt back  to the room and he became more combative. Security was called due to combative behavior. Pt was put back in the bed and restraints were applied. Pt kept trying to get out of the bed and getting a hold of the IV pole were medication was running and trying to throw it at the RN. IV pole was then moved out of the pt's reach while he continued to shout verbal insults. MD Nevada Crane notified.

## 2022-07-21 NOTE — Progress Notes (Signed)
Nurse reached out to clarify amiodarone dosing. The order was entered by Dr. Harrington Challenger as 60 mg/hr (1 mg/min) x 6 hours, then 30 mg/hr (0.5 mg/min). However, the last order in Epic is for '60mg'$ /hr dosing. Since original drip was started on 3/13, per standard dosing guidelines will reduce drip order to intended '30mg'$ /hr.

## 2022-07-22 DIAGNOSIS — R569 Unspecified convulsions: Secondary | ICD-10-CM | POA: Diagnosis not present

## 2022-07-22 DIAGNOSIS — F1010 Alcohol abuse, uncomplicated: Secondary | ICD-10-CM | POA: Diagnosis not present

## 2022-07-22 LAB — CBC
HCT: 25.7 % — ABNORMAL LOW (ref 39.0–52.0)
Hemoglobin: 8.7 g/dL — ABNORMAL LOW (ref 13.0–17.0)
MCH: 32.2 pg (ref 26.0–34.0)
MCHC: 33.9 g/dL (ref 30.0–36.0)
MCV: 95.2 fL (ref 80.0–100.0)
Platelets: 104 10*3/uL — ABNORMAL LOW (ref 150–400)
RBC: 2.7 MIL/uL — ABNORMAL LOW (ref 4.22–5.81)
RDW: 17.3 % — ABNORMAL HIGH (ref 11.5–15.5)
WBC: 7.4 10*3/uL (ref 4.0–10.5)
nRBC: 0 % (ref 0.0–0.2)

## 2022-07-22 LAB — COMPREHENSIVE METABOLIC PANEL
ALT: 40 U/L (ref 0–44)
AST: 63 U/L — ABNORMAL HIGH (ref 15–41)
Albumin: 2.9 g/dL — ABNORMAL LOW (ref 3.5–5.0)
Alkaline Phosphatase: 84 U/L (ref 38–126)
Anion gap: 8 (ref 5–15)
BUN: <5 mg/dL — ABNORMAL LOW (ref 6–20)
CO2: 21 mmol/L — ABNORMAL LOW (ref 22–32)
Calcium: 7.3 mg/dL — ABNORMAL LOW (ref 8.9–10.3)
Chloride: 101 mmol/L (ref 98–111)
Creatinine, Ser: 0.67 mg/dL (ref 0.61–1.24)
GFR, Estimated: >60 mL/min (ref >60)
Glucose, Bld: 105 mg/dL — ABNORMAL HIGH (ref 70–99)
Potassium: 3.8 mmol/L (ref 3.5–5.1)
Sodium: 130 mmol/L — ABNORMAL LOW (ref 135–145)
Total Bilirubin: 0.4 mg/dL (ref 0.3–1.2)
Total Protein: 5.6 g/dL — ABNORMAL LOW (ref 6.5–8.1)

## 2022-07-22 LAB — MAGNESIUM: Magnesium: 1.6 mg/dL — ABNORMAL LOW (ref 1.7–2.4)

## 2022-07-22 LAB — CK: Total CK: 1037 U/L — ABNORMAL HIGH (ref 49–397)

## 2022-07-22 MED ORDER — POTASSIUM CHLORIDE CRYS ER 20 MEQ PO TBCR
20.0000 meq | EXTENDED_RELEASE_TABLET | Freq: Once | ORAL | Status: AC
  Administered 2022-07-22: 20 meq via ORAL
  Filled 2022-07-22: qty 1

## 2022-07-22 MED ORDER — AMIODARONE HCL 200 MG PO TABS
200.0000 mg | ORAL_TABLET | Freq: Two times a day (BID) | ORAL | Status: DC
  Administered 2022-07-22 – 2022-07-23 (×2): 200 mg via ORAL
  Filled 2022-07-22 (×2): qty 1

## 2022-07-22 MED ORDER — NICOTINE 21 MG/24HR TD PT24
21.0000 mg | MEDICATED_PATCH | Freq: Every day | TRANSDERMAL | Status: DC
  Administered 2022-07-22 – 2022-07-23 (×2): 21 mg via TRANSDERMAL
  Filled 2022-07-22 (×2): qty 1

## 2022-07-22 MED ORDER — MAGNESIUM OXIDE -MG SUPPLEMENT 400 (240 MG) MG PO TABS
400.0000 mg | ORAL_TABLET | Freq: Two times a day (BID) | ORAL | Status: DC
  Administered 2022-07-22 – 2022-07-23 (×3): 400 mg via ORAL
  Filled 2022-07-22 (×3): qty 1

## 2022-07-22 MED ORDER — CARVEDILOL 12.5 MG PO TABS
12.5000 mg | ORAL_TABLET | Freq: Two times a day (BID) | ORAL | Status: DC
  Administered 2022-07-22 – 2022-07-23 (×2): 12.5 mg via ORAL
  Filled 2022-07-22 (×2): qty 1

## 2022-07-22 MED ORDER — METOPROLOL SUCCINATE ER 100 MG PO TB24: 100.0000 mg | ORAL_TABLET | Freq: Two times a day (BID) | ORAL | Status: DC

## 2022-07-22 MED ORDER — MAGNESIUM SULFATE 4 GM/100ML IV SOLN
4.0000 g | Freq: Once | INTRAVENOUS | Status: AC
  Administered 2022-07-22: 4 g via INTRAVENOUS
  Filled 2022-07-22: qty 100

## 2022-07-22 NOTE — Progress Notes (Signed)
Mobility Specialist Progress Note:   07/22/22 1110  Mobility  Activity Ambulated with assistance in hallway  Level of Assistance Contact guard assist, steadying assist  Assistive Device None  Distance Ambulated (ft) 200 ft  Activity Response Tolerated fair  Mobility Referral Yes  $Mobility charge 1 Mobility   Responded to bed alarm. Pt found almost to door with IV pole/catheter pulling behind. Agreeable to ambulate in hallway. Cognition baseline per RN. Pt back in bed with all needs met, alarm on.   Nelta Numbers Mobility Specialist Please contact via SecureChat or  Rehab office at [phone]

## 2022-07-22 NOTE — Progress Notes (Signed)
PROGRESS NOTE        PATIENT DETAILS Name: [name] Age: 52 y.o. Sex: male Date of Birth: October 26, 1970 Admit Date: 07/19/2022 Admitting Physician Kayleen Memos, DO PCP:Pcp, No  Brief Summary: Patient is a 52 y.o.  male history of PAF, history of WPW syndrome s/p ablation, EtOH use, chronic pancreatitis secondary to EtOH use, bipolar disorder, chronic anxiety/depression, cocaine use-who presented to the ED with seizures.  Significant events: 3/12>> admitted to [facility]-brought in by EMS for seizures. 3/13>>Afib RVR-BP soft-amiodarone gtt-cards consulted  Significant studies: 3/12>> CXR: No PNA 3/12>> CT head: No acute process 3/12>> CT abdomen/pelvis: No acute abnormality in the abdomen/pelvis.  Sequelae of chronic pancreatitis with unchanged pseudocyst. 3/13>> EEG: No seizures  Significant microbiology data: 3/12>> COVID/influenza/RSV PCR: Negative  Procedures: None  Consults: Cardiology  Subjective: No major issues overnight-converted to sinus rhythm yesterday evening.  Sleeping comfortably-answers all my questions-moves all 4 extremities.  Objective: Vitals: Blood pressure (!) 126/91, pulse 78, temperature (P) 97.6 F (36.4 C), temperature source (P) Oral, resp. rate (P) 20, height 5\' 9"  (1.753 m), weight 61.2 kg, SpO2 100 %.   Exam: Gen Exam:Alert awake-not in any distress HEENT:atraumatic, normocephalic Chest: B/L clear to auscultation anteriorly CVS:S1S2 regular Abdomen:soft non tender, non distended Extremities:no edema Neurology: Non focal Skin: no rash  Pertinent Labs/Radiology:    Latest Ref Rng & Units 07/22/2022    2:33 AM 07/21/2022    8:07 AM 07/20/2022    5:15 AM  CBC  WBC 4.0 - 10.5 K/uL 7.4  9.7  11.1   Hemoglobin 13.0 - 17.0 g/dL 8.7  9.1  12.0   Hematocrit 39.0 - 52.0 % 25.7  28.3  37.5   Platelets 150 - 400 K/uL 104  99  124     Lab Results  Component Value Date   NA 130 (L) 07/22/2022   K 3.8 07/22/2022    CL 101 07/22/2022   CO2 21 (L) 07/22/2022      Assessment/Plan: Seizure disorder In the setting of alcohol use/cocaine use/profound electrolyte abnormalities Poor historian but claims last drink was around 3-5 days prior to his ED presentation No further seizures Continue Ativan per CIWA protocol CT head unremarkable-EEG negative for seizures-no indication for AEDs.   EtOH use/withdrawal Has episodes of agitation/confusion-however much better for the past several days Continue Ativan for CIWA protocol.  Paroxysmal A-fib RVR Converted to sinus rhythm overnight On amiodarone infusion Cardiology following Not a ideal candidate for anticoagulation given drug use/noncompliance.    Hypokalemia Due to EtOH use and severe hypomagnesemia Repleted  Hypomagnesemia Likely due to EtOH use Although improved-still on the lower side-start scheduled magnesium oxide for a few days, continue IV magnesium sulfate x 1 dose today Repeat electrolytes tomorrow morning.   Hypocalcemia Likely due to hypomagnesemia (PTH/vitamin D levels normal) Improving Continue with oral calcitriol/Os-Cal-suspect will not need on discharge.  Non anion gap metabolic acidosis Suspect this is from intermittent diarrhea in the setting of chronic pancreatitis Improved with IV fluid hydration with bicarb-this was discontinued on 3/13 Bicarb now stable on oral bicarbonate.  Alcoholic hepatitis Mild LFTs improving  Rhabdomyolysis Mild-likely in the setting of seizures Follow CK  History of WPW syndrome s/p ablation Telemetry monitoring  Chronic pancreatitis Chronic pain syndrome Has chronic abdominal pain Continue Creon Continue as needed narcotics  Anxiety/depression Relatively stable Continue Cymbalta  Cocaine  use/EtOH use Poor historian but acknowledges cocaine use approximately 2 days back, EtOH use-last drink around 3-5 days back Counseled significantly-not sure if he has any intention of  quitting at this point.  Underweight: Estimated body mass index is 19.94 kg/m as calculated from the following:   Height as of this encounter: 5\' 9"  (1.753 m).   Weight as of this encounter: 61.2 kg.   Code status:   Code Status: Full Code   DVT Prophylaxis: enoxaparin (LOVENOX) injection 40 mg Start: 07/20/22 1000   Family Communication: [phone] updated 3/15   Disposition Plan: Status is: Inpatient Remains inpatient appropriate because: Severity of illness   Planned Discharge Destination:Home   Diet: Diet Order             Diet Heart Room service appropriate? Yes; Fluid consistency: Thin  Diet effective now                     Antimicrobial agents: Anti-infectives (From admission, onward)    None        MEDICATIONS: Scheduled Meds:  calcitRIOL  0.5 mcg Oral Daily   calcium-vitamin D  1 tablet Oral BID   dicyclomine  10 mg Oral BID   DULoxetine  60 mg Oral QHS   enoxaparin (LOVENOX) injection  40 mg Subcutaneous Daily   folic acid  1 mg Oral Daily   lipase/protease/amylase  36,000 Units Oral TID AC   LORazepam  0-4 mg Intravenous Q6H   Followed by   LORazepam  0-4 mg Intravenous Q12H   magnesium oxide  400 mg Oral BID   metoprolol tartrate  50 mg Oral QID   multivitamin with minerals  1 tablet Oral Daily   nicotine  21 mg Transdermal Daily   pantoprazole  40 mg Oral BID   sodium bicarbonate  650 mg Oral BID   sucralfate  1 g Oral TID WC & HS   thiamine  100 mg Oral Daily   Or   thiamine  100 mg Intravenous Daily   Continuous Infusions:  amiodarone 30 mg/hr (07/22/22 0538)   magnesium sulfate bolus IVPB 4 g (07/22/22 1011)   PRN Meds:.albuterol, alum & mag hydroxide-simeth, HYDROcodone-acetaminophen, HYDROmorphone (DILAUDID) injection, LORazepam **OR** LORazepam, LORazepam, melatonin, metoprolol tartrate, polyethylene glycol, prochlorperazine   I have personally reviewed following labs and imaging studies  LABORATORY  DATA: CBC: Recent Labs  Lab 07/19/22 2010 07/20/22 0408 07/20/22 0515 07/21/22 0807 07/22/22 0233  WBC 8.3  --  11.1* 9.7 7.4  NEUTROABS 4.9  --   --   --   --   HGB 10.7* 12.2* 12.0* 9.1* 8.7*  HCT 33.0* 36.0* 37.5* 28.3* 25.7*  MCV 97.1  --  97.2 99.6 95.2  PLT 132*  --  124* 99* 104*     Basic Metabolic Panel: Recent Labs  Lab 07/20/22 0324 07/20/22 0408 07/20/22 0515 07/20/22 0822 07/20/22 1408 07/20/22 1714 07/21/22 0807 07/22/22 0233  NA 141 142  --   --  136 137 138 130*  K 3.4* 3.6  --   --  3.1* 3.3* 4.7 3.8  CL 115*  --   --   --  104 106 109 101  CO2 12*  --   --   --  18* 19* 19* 21*  GLUCOSE 72  --   --   --  202* 156* 101* 105*  BUN <5*  --   --   --  <5* <5* <5* <5*  CREATININE 0.76  --   --   --  0.86 0.91 0.66 0.67  CALCIUM 5.6*  --   --   --  7.0* 7.1* 7.2* 7.3*  MG  --   --  0.8*  --   --  1.7 1.6* 1.6*  PHOS  --   --   --  4.1  --  3.6 2.4*  --      GFR: Estimated Creatinine Clearance: 92.4 mL/min (by C-G formula based on SCr of 0.67 mg/dL).  Liver Function Tests: Recent Labs  Lab 07/19/22 2010 07/20/22 1408 07/21/22 0807 07/22/22 0233  AST 175* 115* 99* 63*  ALT 56* 44 48* 40  ALKPHOS 96 84 87 84  BILITOT 0.4 0.5 0.9 0.4  PROT 7.2 6.0* 6.2* 5.6*  ALBUMIN 3.9 3.2* 3.3* 2.9*    Recent Labs  Lab 07/19/22 2010  LIPASE 27    No results for input(s): "AMMONIA" in the last 168 hours.  Coagulation Profile: No results for input(s): "INR", "PROTIME" in the last 168 hours.  Cardiac Enzymes: Recent Labs  Lab 07/19/22 2010 07/22/22 0233  CKTOTAL 634* 1,037*     BNP (last 3 results) No results for input(s): "PROBNP" in the last 8760 hours.  Lipid Profile: No results for input(s): "CHOL", "HDL", "LDLCALC", "TRIG", "CHOLHDL", "LDLDIRECT" in the last 72 hours.  Thyroid Function Tests: Recent Labs    07/19/22 2010  TSH 2.004  FREET4 0.59*  T3FREE 3.3     Anemia Panel: No results for input(s): "VITAMINB12", "FOLATE",  "FERRITIN", "TIBC", "IRON", "RETICCTPCT" in the last 72 hours.  Urine analysis:    Component Value Date/Time   COLORURINE COLORLESS (A) 05/28/2022 0343   APPEARANCEUR CLEAR 05/28/2022 0343   LABSPEC 1.005 05/28/2022 0343   PHURINE 5.0 05/28/2022 0343   GLUCOSEU NEGATIVE 05/28/2022 0343   HGBUR NEGATIVE 05/28/2022 0343   HGBUR negative 04/30/2010 1020   BILIRUBINUR NEGATIVE 05/28/2022 0343   BILIRUBINUR negative 09/06/2019 1649   KETONESUR NEGATIVE 05/28/2022 0343   PROTEINUR NEGATIVE 05/28/2022 0343   UROBILINOGEN 0.2 09/06/2019 1649   UROBILINOGEN 0.2 03/15/2015 0508   NITRITE NEGATIVE 05/28/2022 0343   LEUKOCYTESUR NEGATIVE 05/28/2022 0343    Sepsis Labs: Lactic Acid, Venous    Component Value Date/Time   LATICACIDVEN 1.8 07/20/2022 [id number]    MICROBIOLOGY: Recent Results (from the past 240 hour(s))  Resp panel by RT-PCR (RSV, Flu A&B, Covid) Anterior Nasal Swab     Status: None   Collection Time: 07/19/22  8:18 PM   Specimen: Anterior Nasal Swab  Result Value Ref Range Status   SARS Coronavirus 2 by RT PCR NEGATIVE NEGATIVE Final   Influenza A by PCR NEGATIVE NEGATIVE Final   Influenza B by PCR NEGATIVE NEGATIVE Final    Comment: (NOTE) The Xpert Xpress SARS-CoV-2/FLU/RSV plus assay is intended as an aid in the diagnosis of influenza from Nasopharyngeal swab specimens and should not be used as a sole basis for treatment. Nasal washings and aspirates are unacceptable for Xpert Xpress SARS-CoV-2/FLU/RSV testing.  Fact Sheet for Patients: EntrepreneurPulse.com.au  Fact Sheet for Healthcare Providers: IncredibleEmployment.be  This test is not yet approved or cleared by the [place] FDA and has been authorized for detection and/or diagnosis of SARS-CoV-2 by FDA under an Emergency Use Authorization (EUA). This EUA will remain in effect (meaning this test can be used) for the duration of the COVID-19 declaration under Section  564(b)(1) of the Act, 21 U.S.C. section 360bbb-3(b)(1), unless the authorization is terminated or revoked.     Resp Syncytial Virus by PCR NEGATIVE NEGATIVE Final  Comment: (NOTE) Fact Sheet for Patients: EntrepreneurPulse.com.au  Fact Sheet for Healthcare Providers: IncredibleEmployment.be  This test is not yet approved or cleared by the [place] FDA and has been authorized for detection and/or diagnosis of SARS-CoV-2 by FDA under an Emergency Use Authorization (EUA). This EUA will remain in effect (meaning this test can be used) for the duration of the COVID-19 declaration under Section 564(b)(1) of the Act, 21 U.S.C. section 360bbb-3(b)(1), unless the authorization is terminated or revoked.  Performed at [facility], [place] [address], [place], [place] [address]     RADIOLOGY STUDIES/RESULTS: No results found.   LOS: 2 days   Oren Binet, MD  [facility]    To contact the attending provider between 7A-7P or the covering provider during after hours 7P-7A, please log into the web site [email or web address] and access using universal [place] password for that web site. If you do not have the password, please call the hospital operator.  07/22/2022, 10:18 AM

## 2022-07-22 NOTE — Progress Notes (Addendum)
Rounding Note    Patient Name: [name] Date of Encounter: 07/22/2022  [place] Cardiologist: Minus Breeding, MD   Subjective   Patient appears to be in no acute distress. Reports that he has ongoing severe upper abdominal pain 2/2 pancreatitis. Denies chest pain, shortness of breath, palpitations.   Inpatient Medications    Scheduled Meds:  amiodarone  200 mg Oral BID   calcitRIOL  0.5 mcg Oral Daily   calcium-vitamin D  1 tablet Oral BID   carvedilol  12.5 mg Oral BID WC   dicyclomine  10 mg Oral BID   DULoxetine  60 mg Oral QHS   enoxaparin (LOVENOX) injection  40 mg Subcutaneous Daily   folic acid  1 mg Oral Daily   lipase/protease/amylase  36,000 Units Oral TID AC   LORazepam  0-4 mg Intravenous Q6H   Followed by   LORazepam  0-4 mg Intravenous Q12H   magnesium oxide  400 mg Oral BID   multivitamin with minerals  1 tablet Oral Daily   nicotine  21 mg Transdermal Daily   pantoprazole  40 mg Oral BID   sodium bicarbonate  650 mg Oral BID   sucralfate  1 g Oral TID WC & HS   thiamine  100 mg Oral Daily   Or   thiamine  100 mg Intravenous Daily   Continuous Infusions:  amiodarone 30 mg/hr (07/22/22 0538)   PRN Meds: albuterol, alum & mag hydroxide-simeth, HYDROcodone-acetaminophen, HYDROmorphone (DILAUDID) injection, LORazepam **OR** LORazepam, LORazepam, melatonin, metoprolol tartrate, polyethylene glycol, prochlorperazine   Vital Signs    Vitals:   07/22/22 0524 07/22/22 0918 07/22/22 1011 07/22/22 1242  BP: 123/82 (!) 170/116 (!) 126/91 (!) 146/99  Pulse: 80 85 78 73  Resp:  (P) 20 15   Temp:  (P) 97.6 F (36.4 C) 97.8 F (36.6 C)   TempSrc:  (P) Oral Oral   SpO2:      Weight:      Height:        Intake/Output Summary (Last 24 hours) at 07/22/2022 1336 Last data filed at 07/22/2022 0900 Gross per 24 hour  Intake 240 ml  Output --  Net 240 ml      07/21/2022    8:01 AM 07/08/2022    5:19 AM 06/27/2022    2:26 PM  Last  3 Weights  Weight (lbs) 135 lb 135 lb 135 lb  Weight (kg) 61.236 kg 61.236 kg 61.236 kg      Telemetry    Sinus rhythm. Converted from afib at approximately 1926 on 3/14 - Personally Reviewed  ECG    NSR - Personally Reviewed  Physical Exam   GEN: No acute distress.   Neck: No JVD Cardiac: RRR, no murmurs, rubs, or gallops.  Respiratory: diffuse rhonchi with deep inspiration. Improves with coughing GI: Soft, non-distended. Epigastric tenderness MS: No edema; No deformity. Neuro:  Nonfocal  Psych: Normal affect   Labs    High Sensitivity Troponin:   Recent Labs  Lab 06/27/22 1439 06/27/22 1629 07/19/22 2010 07/19/22 2210 07/20/22 1113  TROPONINIHS 13 15 15  20* 14     Chemistry Recent Labs  Lab 07/20/22 1408 07/20/22 1714 07/21/22 0807 07/22/22 0233  NA 136 137 138 130*  K 3.1* 3.3* 4.7 3.8  CL 104 106 109 101  CO2 18* 19* 19* 21*  GLUCOSE 202* 156* 101* 105*  BUN <5* <5* <5* <5*  CREATININE 0.86 0.91 0.66 0.67  CALCIUM 7.0* 7.1* 7.2* 7.3*  MG  --  1.7 1.6* 1.6*  PROT 6.0*  --  6.2* 5.6*  ALBUMIN 3.2*  --  3.3* 2.9*  AST 115*  --  99* 63*  ALT 44  --  48* 40  ALKPHOS 84  --  87 84  BILITOT 0.5  --  0.9 0.4  GFRNONAA >60 >60 >60 >60  ANIONGAP 14 12 10 8     Lipids No results for input(s): "CHOL", "TRIG", "HDL", "LABVLDL", "LDLCALC", "CHOLHDL" in the last 168 hours.  Hematology Recent Labs  Lab 07/20/22 0515 07/21/22 0807 07/22/22 0233  WBC 11.1* 9.7 7.4  RBC 3.86* 2.84* 2.70*  HGB 12.0* 9.1* 8.7*  HCT 37.5* 28.3* 25.7*  MCV 97.2 99.6 95.2  MCH 31.1 32.0 32.2  MCHC 32.0 32.2 33.9  RDW 16.9* 17.7* 17.3*  PLT 124* 99* 104*   Thyroid  Recent Labs  Lab 07/19/22 2010  TSH 2.004  FREET4 0.59*    BNPNo results for input(s): "BNP", "PROBNP" in the last 168 hours.  DDimer No results for input(s): "DDIMER" in the last 168 hours.   Radiology    No results found.  Cardiac Studies   02/16/22 Limited Echocardiogram  IMPRESSIONS     1.  Left ventricular ejection fraction, by estimation, is 65 to 70%. The  left ventricle has normal function. The left ventricle has no regional  wall motion abnormalities. There is mild left ventricular hypertrophy.  Left ventricular diastolic parameters  were grossly normal. No significant intracavitary gradient.   2. Right ventricular systolic function is normal. The right ventricular  size is normal.   3. The mitral valve is normal in structure. No evidence of mitral valve  regurgitation. No evidence of mitral stenosis.   4. The aortic valve is grossly normal. Aortic valve regurgitation is not  visualized. No aortic stenosis is present.   5. Rhythm strip during this exam demonstrates frequent ectopy/premature  atrial contractions.   FINDINGS   Left Ventricle: Left ventricular ejection fraction, by estimation, is 65  to 70%. The left ventricle has normal function. The left ventricle has no  regional wall motion abnormalities. The left ventricular internal cavity  size was normal in size. There is   mild left ventricular hypertrophy. Left ventricular diastolic parameters  were normal.   Right Ventricle: The right ventricular size is normal. No increase in  right ventricular wall thickness. Right ventricular systolic function is  normal.   Left Atrium: Left atrial size was normal in size.   Right Atrium: Right atrial size was normal in size.   Pericardium: Trivial pericardial effusion is present.   Mitral Valve: The mitral valve is normal in structure. No evidence of  mitral valve stenosis.   Tricuspid Valve: The tricuspid valve is normal in structure. Tricuspid  valve regurgitation is not demonstrated. No evidence of tricuspid  stenosis.   Aortic Valve: AV morphology not evaluated on this exam. The aortic valve  is grossly normal. Aortic valve regurgitation is not visualized. No aortic  stenosis is present.   Aorta: The aortic root is normal in size and structure.   EKG:  Rhythm strip during this exam demonstrates premature atrial  contractions.   Patient Profile     Patient is a 52 y.o. male history of PAF, history of WPW syndrome s/p ablation, EtOH use, chronic pancreatitis secondary to EtOH use, bipolar disorder, chronic anxiety/depression, cocaine use-who presented to the ED with seizures. Cardiology was consulted for management of afib with RVR.   Assessment & Plan  Hx paroxysmal afib Afib with RVR  Patient found with RVR on 3/13 following admission for seizure-like activity. He was placed on amiodarone and metoprolol with spontaneous conversion to sinus rhythm on 3/14 at approximately 1926.   Patient has been on Metoprolol tartrate 50mg  Q6 hours. Although patient has hx cocaine use, appears that he has been taking Metoprolol with recent drug screens negative (though some notes indicate usage as recently as a few days ago. Will transition to Coreg from Metoprolol for safety. Will transition to oral amiodarone 200mg  BID this evening. Given that patient is a poor candidate for anticoagulation, would ideally favor long-term rhythm management. However, with concurrent GI issues/alcohol use disorder and mildly elevated but improving LFTs, not sure that Amiodarone is the best long-term agent.   Per primary team:  Chronic pancreatitis/chronic pain Metabolic acidosis Alcoholic hepatitis Anxiety and depression      For questions or updates, please contact [place] Please consult [email or web address] for contact info under        Signed, Candee Furbish, MD  07/22/2022, 1:36 PM    Personally seen and examined. Agree with above.  52 year old with alcohol and cocaine use with rapid atrial fibrillation converted to sinus rhythm with both amiodarone and metoprolol.  Has WPW post ablation. - Lets go ahead and continue with amiodarone 200 mg twice a day for 2 weeks and then transition him to 200 mg a day for at least a month or until follow-up visit.  At  follow-up visit consider discontinuation of amiodarone if maintaining sinus rhythm.  Given his age, alcohol use, elevated LFTs, amiodarone long-term is not likely a good agent for him. -Agree with transition over to carvedilol given alpha properties and recent cocaine use. -Cocaine cessation.  Candee Furbish, MD

## 2022-07-23 DIAGNOSIS — F1910 Other psychoactive substance abuse, uncomplicated: Secondary | ICD-10-CM | POA: Diagnosis not present

## 2022-07-23 DIAGNOSIS — R569 Unspecified convulsions: Secondary | ICD-10-CM | POA: Diagnosis not present

## 2022-07-23 LAB — BASIC METABOLIC PANEL
Anion gap: 8 (ref 5–15)
BUN: <5 mg/dL — ABNORMAL LOW (ref 6–20)
CO2: 25 mmol/L (ref 22–32)
Calcium: 8.1 mg/dL — ABNORMAL LOW (ref 8.9–10.3)
Chloride: 100 mmol/L (ref 98–111)
Creatinine, Ser: 0.7 mg/dL (ref 0.61–1.24)
GFR, Estimated: >60 mL/min (ref >60)
Glucose, Bld: 114 mg/dL — ABNORMAL HIGH (ref 70–99)
Potassium: 3.9 mmol/L (ref 3.5–5.1)
Sodium: 133 mmol/L — ABNORMAL LOW (ref 135–145)

## 2022-07-23 LAB — MAGNESIUM: Magnesium: 1.8 mg/dL (ref 1.7–2.4)

## 2022-07-23 MED ORDER — SUCRALFATE 1 GM/10ML PO SUSP: 1.0000 g | Freq: Three times a day (TID) | ORAL | 0 refills | Status: DC

## 2022-07-23 MED ORDER — MAGNESIUM OXIDE -MG SUPPLEMENT 400 (240 MG) MG PO TABS: 400.0000 mg | ORAL_TABLET | Freq: Every day | ORAL | 0 refills | Status: DC

## 2022-07-23 MED ORDER — ALBUTEROL SULFATE HFA 108 (90 BASE) MCG/ACT IN AERS: 2.0000 | INHALATION_SPRAY | RESPIRATORY_TRACT | 0 refills | Status: DC | PRN

## 2022-07-23 MED ORDER — ADULT MULTIVITAMIN W/MINERALS CH: 1.0000 | ORAL_TABLET | Freq: Every day | ORAL | 0 refills | Status: DC

## 2022-07-23 MED ORDER — HYDROCODONE-ACETAMINOPHEN 5-325 MG PO TABS: 1.0000 | ORAL_TABLET | Freq: Two times a day (BID) | ORAL | 0 refills | Status: DC | PRN

## 2022-07-23 MED ORDER — VITAMIN D (ERGOCALCIFEROL) 1.25 MG (50000 UNIT) PO CAPS: 50000.0000 [IU] | ORAL_CAPSULE | ORAL | 1 refills | Status: DC

## 2022-07-23 MED ORDER — PANCRELIPASE (LIP-PROT-AMYL) 36000-114000 UNITS PO CPEP: ORAL_CAPSULE | ORAL | 0 refills | Status: DC

## 2022-07-23 MED ORDER — DULOXETINE HCL 30 MG PO CPEP: 60.0000 mg | ORAL_CAPSULE | Freq: Every day | ORAL | 1 refills | Status: DC

## 2022-07-23 MED ORDER — MAGNESIUM SULFATE 2 GM/50ML IV SOLN
2.0000 g | Freq: Once | INTRAVENOUS | Status: AC
  Administered 2022-07-23: 2 g via INTRAVENOUS
  Filled 2022-07-23: qty 50

## 2022-07-23 MED ORDER — PANTOPRAZOLE SODIUM 40 MG PO TBEC: 40.0000 mg | DELAYED_RELEASE_TABLET | Freq: Two times a day (BID) | ORAL | 3 refills | Status: DC

## 2022-07-23 MED ORDER — HYDROCODONE-ACETAMINOPHEN 5-325 MG PO TABS
2.0000 | ORAL_TABLET | Freq: Four times a day (QID) | ORAL | Status: DC | PRN
  Administered 2022-07-23: 2 via ORAL
  Filled 2022-07-23: qty 2

## 2022-07-23 MED ORDER — LOPERAMIDE HCL 2 MG PO CAPS: 2.0000 mg | ORAL_CAPSULE | Freq: Four times a day (QID) | ORAL | 0 refills | Status: DC | PRN

## 2022-07-23 MED ORDER — AMIODARONE HCL 200 MG PO TABS: ORAL_TABLET | ORAL | 0 refills | Status: DC

## 2022-07-23 MED ORDER — SUCRALFATE 1 GM/10ML PO SUSP
1.0000 g | Freq: Three times a day (TID) | ORAL | Status: DC
  Administered 2022-07-23 (×2): 1 g via ORAL
  Filled 2022-07-23 (×2): qty 10

## 2022-07-23 MED ORDER — FOLIC ACID 1 MG PO TABS: 1.0000 mg | ORAL_TABLET | Freq: Every day | ORAL | 0 refills | Status: DC

## 2022-07-23 MED ORDER — CARVEDILOL 12.5 MG PO TABS: 12.5000 mg | ORAL_TABLET | Freq: Two times a day (BID) | ORAL | 1 refills | Status: DC

## 2022-07-23 MED ORDER — THIAMINE HCL 100 MG PO TABS: 100.0000 mg | ORAL_TABLET | Freq: Every day | ORAL | 0 refills | Status: DC

## 2022-07-23 MED ORDER — ACETAMINOPHEN ER 650 MG PO TBCR: 650.0000 mg | EXTENDED_RELEASE_TABLET | Freq: Three times a day (TID) | ORAL | 0 refills | Status: DC | PRN

## 2022-07-23 MED ORDER — LIDOCAINE VISCOUS HCL 2 % MT SOLN: 15.0000 mL | Freq: Four times a day (QID) | OROMUCOSAL | 0 refills | Status: DC | PRN

## 2022-07-23 MED ORDER — NICOTINE 21 MG/24HR TD PT24: 21.0000 mg | MEDICATED_PATCH | Freq: Every day | TRANSDERMAL | 0 refills | Status: DC

## 2022-07-23 NOTE — Plan of Care (Signed)
  Problem: Safety: Goal: Non-violent Restraint(s) Outcome: Adequate for Discharge   Problem: Education: Goal: Knowledge of General Education information will improve Description: Including pain rating scale, medication(s)/side effects and non-pharmacologic comfort measures Outcome: Adequate for Discharge   Problem: Health Behavior/Discharge Planning: Goal: Ability to manage health-related needs will improve Outcome: Adequate for Discharge   Problem: Clinical Measurements: Goal: Ability to maintain clinical measurements within normal limits will improve Outcome: Adequate for Discharge Goal: Will remain free from infection Outcome: Adequate for Discharge Goal: Diagnostic test results will improve Outcome: Adequate for Discharge Goal: Respiratory complications will improve Outcome: Adequate for Discharge Goal: Cardiovascular complication will be avoided Outcome: Adequate for Discharge   Problem: Activity: Goal: Risk for activity intolerance will decrease Outcome: Adequate for Discharge   Problem: Nutrition: Goal: Adequate nutrition will be maintained Outcome: Adequate for Discharge   Problem: Coping: Goal: Level of anxiety will decrease Outcome: Adequate for Discharge   Problem: Elimination: Goal: Will not experience complications related to bowel motility Outcome: Adequate for Discharge Goal: Will not experience complications related to urinary retention Outcome: Adequate for Discharge   Problem: Pain Managment: Goal: General experience of comfort will improve Outcome: Adequate for Discharge   Problem: Safety: Goal: Ability to remain free from injury will improve Outcome: Adequate for Discharge   Problem: Skin Integrity: Goal: Risk for impaired skin integrity will decrease Outcome: Adequate for Discharge   

## 2022-07-23 NOTE — Discharge Summary (Addendum)
PATIENT DETAILS Name: [name] Age: 52 y.o. Sex: male Date of Birth: Aug 29, 1970 MRN: [id number]. Admitting Physician: Kayleen Memos, DO PCP:Pcp, No  Admit Date: 07/19/2022 Discharge date: 07/23/2022  Recommendations for Outpatient Follow-up:  Follow up with PCP in 1-2 weeks Please obtain CMP/CBC/magnesium in one week Please ensure follow-up with cardiology Continue counseling regarding importance of abstaining from further alcohol/cocaine use  Admitted From:  Home  Disposition: Home   Discharge Condition: fair  CODE STATUS:   Code Status: Full Code   Diet recommendation:  Diet Order             Diet - low sodium heart healthy           Diet Heart Room service appropriate? Yes; Fluid consistency: Thin  Diet effective now                    Brief Summary: Patient is a 52 y.o.  male history of PAF, history of WPW syndrome s/p ablation, EtOH use, chronic pancreatitis secondary to EtOH use, bipolar disorder, chronic anxiety/depression, cocaine use-who presented to the ED with seizures in the setting of alcohol/cocaine use and profound electrolyte abnormalities.   Significant events: 3/12>> admitted to [facility]-brought in by EMS for seizures. 3/13>>Afib RVR-BP soft-amiodarone gtt-cards consulted   Significant studies: 3/12>> CXR: No PNA 3/12>> CT head: No acute process 3/12>> CT abdomen/pelvis: No acute abnormality in the abdomen/pelvis.  Sequelae of chronic pancreatitis with unchanged pseudocyst. 3/13>> EEG: No seizures   Significant microbiology data: 3/12>> COVID/influenza/RSV PCR: Negative   Procedures: None   Consults: Cardiology  Brief Hospital Course: Seizure disorder In the setting of alcohol use/cocaine use/profound electrolyte abnormalities Poor historian but claims last drink was around 3-5 days prior to his ED presentation No further seizures since hospitalization-he was managed with Ativan per [facility] protocol, profound electrolyte  abnormalities were corrected. CT head unremarkable-EEG negative for seizures-no indication for AEDs.  Patient aware of seizure precautions.   EtOH use/withdrawal Has episodes of agitation/confusion-however much better for the past several days Completely awake/alert Managed with Ativan per [facility] protocol Counseling provided-not sure if he is has any indication to quit   Paroxysmal A-fib RVR Managed with IV amiodarone infusion Has converted to sinus rhythm for the past several days Discussed with Dr. Izetta Dakin amiodarone at twice daily dosing x 1 week, then switch to daily dosing Not a ideal candidate for anticoagulation given drug use/noncompliance.    Hypokalemia Due to EtOH use and severe hypomagnesemia Repleted   Hypomagnesemia Likely due to EtOH use Required several doses of IV magnesium supplementation Magnesium levels have now normalized. Repeat electrolytes at PCPs office in 1 week.   Hypocalcemia Likely due to hypomagnesemia (PTH/vitamin D levels normal) Calcium levels improved after correction of magnesium levels-and with initiation of calcitriol/Os-Cal.  Do not think patient requires further treatment at this time. Repeat electrolytes at PCPs office within 1 week.   Non anion gap metabolic acidosis Suspect this is from intermittent diarrhea in the setting of chronic pancreatitis Improved with IV fluid hydration with bicarb-this was discontinued on [id number]   Alcoholic hepatitis Mild LFTs improving   Rhabdomyolysis Mild-likely in the setting of seizures   History of WPW syndrome s/p ablation Monitored in telemetry unit.   Chronic pancreatitis Chronic pain syndrome Has chronic abdominal pain Continue Creon Continue as needed narcotics   Anxiety/depression Relatively stable Continue Cymbalta   Cocaine use/EtOH use Poor historian but acknowledges cocaine use approximately 2 days back, EtOH use-last drink around  3-5 days back Counseled significantly-not  sure if he has any intention of quitting at this point.   Underweight: Estimated body mass index is 19.94 kg/m as calculated from the following:   Height as of this encounter: 5\' 9"  (1.753 m).   Weight as of this encounter: 61.2 kg.    Discharge Diagnoses:  Principal Problem:   Seizure ([place]) Active Problems:   Polysubstance abuse (tobacco, cocaine, THC, and ETOH)   Tachycardia   Discharge Instructions:  Activity:  As tolerated   Discharge Instructions     Diet - low sodium heart healthy   Complete by: As directed    Discharge instructions   Complete by: As directed    Follow with Primary MD in 1-2 weeks  Stop all alcohol/cocaine use  Please get a complete blood count and chemistry panel checked by your Primary MD at your next visit, and again as instructed by your Primary MD.  Get Medicines reviewed and adjusted: Please take all your medications with you for your next visit with your Primary MD  Laboratory/radiological data: Please request your Primary MD to go over all hospital tests and procedure/radiological results at the follow up, please ask your Primary MD to get all Hospital records sent to his/her office.  In some cases, they will be blood work, cultures and biopsy results pending at the time of your discharge. Please request that your primary care M.D. follows up on these results.  Also Note the following: If you experience worsening of your admission symptoms, develop shortness of breath, life threatening emergency, suicidal or homicidal thoughts you must seek medical attention immediately by calling 911 or calling your MD immediately  if symptoms less severe.  You must read complete instructions/literature along with all the possible adverse reactions/side effects for all the Medicines you take and that have been prescribed to you. Take any new Medicines after you have completely understood and accpet all the possible adverse reactions/side effects.   Do not  drive when taking Pain medications or sleeping medications (Benzodaizepines)  Do not take more than prescribed Pain, Sleep and Anxiety Medications. It is not advisable to combine anxiety,sleep and pain medications without talking with your primary care practitioner  Special Instructions: If you have smoked or chewed Tobacco  in the last 2 yrs please stop smoking, stop any regular Alcohol  and or any Recreational drug use.  Wear Seat belts while driving.  Please note: You were cared for by a hospitalist during your hospital stay. Once you are discharged, your primary care physician will handle any further medical issues. Please note that NO REFILLS for any discharge medications will be authorized once you are discharged, as it is imperative that you return to your primary care physician (or establish a relationship with a primary care physician if you do not have one) for your post hospital discharge needs so that they can reassess your need for medications and monitor your lab values.   Seizure precautions: Per [facility] statutes, patients with seizures are not allowed to drive until they have been seizure-free for six months and cleared by a physician    Use caution when using heavy equipment or power tools. Avoid working on ladders or at heights. Take showers instead of baths. Ensure the water temperature is not too high on the home water heater. Do not go swimming alone. Do not lock yourself in a room alone (i.e. bathroom). When caring for infants or small children, sit down when holding, feeding, or  changing them to minimize risk of injury to the child in the event you have a seizure. Maintain good sleep hygiene. Avoid alcohol.    If patient has another seizure, call 911 and bring them back to the ED if: A.  The seizure lasts longer than 5 minutes.      B.  The patient doesn't wake shortly after the seizure or has new problems such as difficulty seeing, speaking or moving following the  seizure C.  The patient was injured during the seizure D.  The patient has a temperature over 102 F (39C) E.  The patient vomited during the seizure and now is having trouble breathing    During the Seizure   - First, ensure adequate ventilation and place patients on the floor on their left side  Loosen clothing around the neck and ensure the airway is patent. If the patient is clenching the teeth, do not force the mouth open with any object as this can cause severe damage - Remove all items from the surrounding that can be hazardous. The patient may be oblivious to what's happening and may not even know what he or she is doing. If the patient is confused and wandering, either gently guide him/her away and block access to outside areas - Reassure the individual and be comforting - Call 911. In most cases, the seizure ends before EMS arrives. However, there are cases when seizures may last over 3 to 5 minutes. Or the individual may have developed breathing difficulties or severe injuries. If a pregnant patient or a person with diabetes develops a seizure, it is prudent to call an ambulance. - Finally, if the patient does not regain full consciousness, then call EMS. Most patients will remain confused for about 45 to 90 minutes after a seizure, so you must use judgment in calling for help. - Avoid restraints but make sure the patient is in a bed with padded side rails - Place the individual in a lateral position with the neck slightly flexed; this will help the saliva drain from the mouth and prevent the tongue from falling backward - Remove all nearby furniture and other hazards from the area - Provide verbal assurance as the individual is regaining consciousness - Provide the patient with privacy if possible - Call for help and start treatment as ordered by the caregiver    After the Seizure (Postictal Stage)   After a seizure, most patients experience confusion, fatigue, muscle pain and/or a  headache. Thus, one should permit the individual to sleep. For the next few days, reassurance is essential. Being calm and helping reorient the person is also of importance.   Most seizures are painless and end spontaneously. Seizures are not harmful to others but can lead to complications such as stress on the lungs, brain and the heart. Individuals with prior lung problems may develop labored breathing and respiratory distress.    Increase activity slowly   Complete by: As directed       Allergies as of 07/23/2022       Reactions   Robaxin [methocarbamol] Other (See Comments)   jumpy limbs   Aspirin Other (See Comments)   Unknown reaction   Shellfish-derived Products Nausea And Vomiting, Rash   Trazodone And Nefazodone Other (See Comments)   Muscle spasms   Adhesive [tape] Itching   Latex Itching   Toradol [ketorolac Tromethamine] Other (See Comments)   Has ulcers; cannot have this   Contrast Media [iodinated Contrast Media] Hives  Reglan [metoclopramide] Other (See Comments)   Muscle spasms   Salmon [fish Oil] Nausea And Vomiting, Rash        Medication List     STOP taking these medications    amLODipine 10 MG tablet Commonly known as: NORVASC   benzonatate 100 MG capsule Commonly known as: TESSALON   dextromethorphan-guaiFENesin 30-600 MG 12hr tablet Commonly known as: MUCINEX DM   dicyclomine 10 MG capsule Commonly known as: BENTYL   lidocaine 4 % cream Commonly known as: LMX   metoprolol 200 MG 24 hr tablet Commonly known as: TOPROL-XL   metoprolol succinate 25 MG 24 hr tablet Commonly known as: Toprol XL   nicotine polacrilex 4 MG gum Commonly known as: Nicorette Starter Kit   nitroGLYCERIN 0.4 MG SL tablet Commonly known as: Nitrostat   ondansetron 4 MG tablet Commonly known as: Zofran   oxyCODONE 5 MG immediate release tablet Commonly known as: Roxicodone   oxyCODONE-acetaminophen 5-325 MG tablet Commonly known as: PERCOCET/ROXICET    SYSTANE OP       TAKE these medications    acetaminophen 650 MG CR tablet Commonly known as: TYLENOL Take 1 tablet (650 mg total) by mouth every 8 (eight) hours as needed for pain. What changed: Another medication with the same name was removed. Continue taking this medication, and follow the directions you see here.   albuterol 108 (90 Base) MCG/ACT inhaler Commonly known as: VENTOLIN HFA Inhale 2 puffs into the lungs every 4 (four) hours as needed for wheezing or shortness of breath.   amiodarone 200 MG tablet Commonly known as: PACERONE 200 mg twice daily x 1 week, then switch to 200 mg daily   carvedilol 12.5 MG tablet Commonly known as: COREG Take 1 tablet (12.5 mg total) by mouth 2 (two) times daily with a meal.   DULoxetine 30 MG capsule Commonly known as: CYMBALTA Take 2 capsules (60 mg total) by mouth at bedtime. What changed: additional instructions   folic acid 1 MG tablet Commonly known as: FOLVITE Take 1 tablet (1 mg total) by mouth daily.   HYDROcodone-acetaminophen 5-325 MG tablet Commonly known as: NORCO/VICODIN Take 1-2 tablets by mouth every 12 (twelve) hours as needed for moderate pain. What changed: how much to take   lidocaine 2 % solution Commonly known as: XYLOCAINE Use as directed 15 mLs in the mouth or throat every 6 (six) hours as needed for mouth pain.   lipase/protease/amylase 36000 UNITS Cpep capsule Commonly known as: Creon Take 2 capsules (72,000 Units total) by mouth 3 (three) times daily with meals. May also take 1 capsule (36,000 Units total) as needed (with snacks).   loperamide 2 MG capsule Commonly known as: IMODIUM Take 1 capsule (2 mg total) by mouth 4 (four) times daily as needed for diarrhea or loose stools.   magnesium oxide 400 (240 Mg) MG tablet Commonly known as: MAG-OX Take 1 tablet (400 mg total) by mouth daily.   multivitamin with minerals Tabs tablet Take 1 tablet by mouth daily.   nicotine 21 mg/24hr  patch Commonly known as: NICODERM CQ - dosed in mg/24 hours Place 1 patch (21 mg total) onto the skin at bedtime.   pantoprazole 40 MG tablet Commonly known as: Protonix Take 1 tablet (40 mg total) by mouth 2 (two) times daily.   sucralfate 1 GM/10ML suspension Commonly known as: Carafate Take 10 mLs (1 g total) by mouth 4 (four) times daily -  with meals and at bedtime.   thiamine 100 MG  tablet Commonly known as: VITAMIN B1 Take 1 tablet (100 mg total) by mouth daily.   Vitamin D (Ergocalciferol) 1.25 MG (50000 UNIT) Caps capsule Commonly known as: DRISDOL Take 1 capsule (50,000 Units total) by mouth every Wednesday. Start taking on: July 27, 2022        Follow-up [facility] at [place] to .   Specialty: Emergency Medicine Why: As needed, If symptoms worsen Contact information: [address] [id number] [place], [place]. Schedule an appointment as soon as possible for a visit in 2 days.   Specialty: Family Medicine Why: If you do not have PCP, please see PCP above, Follow up from ER visit Contact information: [address] [place] [address] [phone]         [place]. Schedule an appointment as soon as possible for a visit in 2 days.   Specialty: Internal Medicine Why: Follow up from ER visit, If you do not have PCP, please see PCP above Contact information: [address] [place] [phone]        Minus Breeding, MD. Schedule an appointment as soon as possible for a visit in 1 week(s).   Specialty: Cardiology Contact information: [address] [place] [address] [phone]                Allergies  Allergen Reactions   Robaxin [Methocarbamol] Other (See Comments)    jumpy limbs   Aspirin Other  (See Comments)    Unknown reaction   Shellfish-Derived Products Nausea And Vomiting and Rash   Trazodone And Nefazodone Other (See Comments)    Muscle spasms   Adhesive [Tape] Itching   Latex Itching   Toradol [Ketorolac Tromethamine] Other (See Comments)    Has ulcers; cannot have this   Contrast Media [Iodinated Contrast Media] Hives   Reglan [Metoclopramide] Other (See Comments)    Muscle spasms   Salmon [Fish Oil] Nausea And Vomiting and Rash     Other Procedures/Studies: EEG adult  Result Date: 08-16-2022 Lora Havens, MD     08/16/22 10:11 AM Patient Name: [name] MRN: [id number] Epilepsy Attending: Lora Havens Referring Physician/Provider: Kayleen Memos, DO Date: 313/2024 Duration: 24.36 mins Patient history: 52yo M with seizure in setting of alcohol withdrawal. EEG to evaluate for seizure Level of alertness: Awake AEDs during EEG study: Ativan Technical aspects: This EEG study was done with scalp electrodes positioned according to the 10-20 International system of electrode placement. Electrical activity was reviewed with band pass filter of 1-70Hz , sensitivity of 7 uV/mm, display speed of 57mm/sec with a 60Hz  notched filter applied as appropriate. EEG data were recorded continuously and digitally stored.  Video monitoring was available and reviewed as appropriate. Description: The posterior dominant rhythm consists of 9 Hz activity of moderate voltage (25-35 uV) seen predominantly in posterior head regions, symmetric and reactive to eye opening and eye closing. There is an excessive amount of 15 to 18 Hz beta activity distributed symmetrically and diffusely. Physiologic photic driving was seen during photic stimulation.  Hyperventilation was not performed.   ABNORMALITY - Excessive beta, generalized IMPRESSION: This study is within normal limits. The excessive beta activity seen in the background is most likely due to the effect of benzodiazepine and is a benign  EEG pattern. No seizures  or epileptiform discharges were seen throughout the recording. Lora Havens   CT Head Wo Contrast  Result Date: 07/19/2022 CLINICAL DATA:  Seizure, head trauma EXAM: CT HEAD WITHOUT CONTRAST TECHNIQUE: Contiguous axial images were obtained from the base of the skull through the vertex without intravenous contrast. RADIATION DOSE REDUCTION: This exam was performed according to the departmental dose-optimization program which includes automated exposure control, adjustment of the mA and/or kV according to patient size and/or use of iterative reconstruction technique. COMPARISON:  05/12/2021 FINDINGS: Brain: No acute infarct or hemorrhage. Lateral ventricles and midline structures are unremarkable. No acute extra-axial fluid collections. No mass effect. Vascular: No hyperdense vessel or unexpected calcification. Skull: Normal. Negative for fracture or focal lesion. Sinuses/Orbits: Chronic posttraumatic and postsurgical changes of the inferior and medial left orbital wall unchanged. Paranasal sinuses are clear. Other: None. IMPRESSION: 1. Stable head CT, no acute process. Electronically Signed   By: Randa Ngo M.D.   On: 07/19/2022 22:37   CT ABDOMEN PELVIS WO CONTRAST  Result Date: 07/19/2022 CLINICAL DATA:  Epigastric pain. Patient arrives to ED for seizure activity. Patient postictal and altered on scene per EMS. EXAM: CT ABDOMEN AND PELVIS WITHOUT CONTRAST TECHNIQUE: Multidetector CT imaging of the abdomen and pelvis was performed following the standard protocol without IV contrast. RADIATION DOSE REDUCTION: This exam was performed according to the departmental dose-optimization program which includes automated exposure control, adjustment of the mA and/or kV according to patient size and/or use of iterative reconstruction technique. COMPARISON:  CT 05/28/2022 FINDINGS: Lower chest: No acute abnormality. Hepatobiliary: Unremarkable liver, gallbladder, and biliary tree.  Pancreas: Coarse calcifications in the pancreatic head and body compatible with sequela of chronic pancreatitis. Low-density fluid collection in the pancreatic tail measuring 2.2 cm is likely a pseudocyst. Additional pseudocyst in the distal pancreatic tail measuring 1.1 cm. Spleen: Unremarkable. Adrenals/Urinary Tract: Normal adrenal glands. No urinary calculi or hydronephrosis. Unremarkable bladder. Stomach/Bowel: Normal caliber large and small bowel. No bowel wall thickening. Unremarkable stomach. Normal appendix. Vascular/Lymphatic: Aortic atherosclerosis. No enlarged abdominal or pelvic lymph nodes. Reproductive: Unremarkable. Other: No free intraperitoneal air.  No abdominal wall hernia. Musculoskeletal: No acute or significant osseous findings. IMPRESSION: 1. No acute abnormality identified in the abdomen or pelvis. 2. 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 Aortic Atherosclerosis (ICD10-I70.0). Electronically Signed   By: Placido Sou M.D.   On: 07/19/2022 22:07   DG Chest 1 View  Result Date: 07/19/2022 CLINICAL DATA:  Seizures, Tachy EXAM: CHEST  1 VIEW COMPARISON:  07/08/2022 FINDINGS: Lungs are clear. Heart size and mediastinal contours are within normal limits. No effusion. Visualized bones unremarkable. IMPRESSION: No acute cardiopulmonary disease. Electronically Signed   By: Lucrezia Europe M.D.   On: 07/19/2022 20:14   DG Chest Port 1 View  Result Date: 07/08/2022 CLINICAL DATA:  Substernal chest pain EXAM: PORTABLE CHEST 1 VIEW COMPARISON:  06/27/2022 FINDINGS: The lungs are clear without focal pneumonia, edema, pneumothorax or pleural effusion. The cardiopericardial silhouette is within normal limits for size. The visualized bony structures of the thorax are unremarkable. Telemetry leads overlie the chest. IMPRESSION: No active disease. Electronically Signed   By: Misty Stanley M.D.   On: 07/08/2022 06:10   DG Chest 2 View  Result Date:  06/27/2022 CLINICAL DATA:  Sharp stabbing left-sided chest pain EXAM: CHEST - 2 VIEW COMPARISON:  Chest radiograph dated 05/28/2022 FINDINGS: Normal lung volumes. No focal consolidations. No pleural effusion or pneumothorax. The heart size and mediastinal contours are  within normal limits. The visualized skeletal structures are unremarkable. IMPRESSION: No active cardiopulmonary disease. Electronically Signed   By: Darrin Nipper M.D.   On: 06/27/2022 15:17     TODAY-DAY OF DISCHARGE:  Subjective:   [name] today has no headache,no chest abdominal pain,no new weakness tingling or numbness, feels much better wants to go home today.   Objective:   Blood pressure (!) 152/100, pulse 85, temperature 98.9 F (37.2 C), temperature source Oral, resp. rate 20, height 5\' 9"  (1.753 m), weight 61.2 kg, SpO2 95 %.  Intake/Output Summary (Last 24 hours) at 07/23/2022 1254 Last data filed at 07/22/2022 1524 Gross per 24 hour  Intake 661.89 ml  Output --  Net 661.89 ml   Filed Weights   07/21/22 0801  Weight: 61.2 kg    Exam: Awake Alert, Oriented *3, No new F.N deficits, Normal affect [place].AT,PERRAL Supple Neck,No JVD, No cervical lymphadenopathy appriciated.  Symmetrical Chest wall movement, Good air movement bilaterally, CTAB RRR,No Gallops,Rubs or new Murmurs, No Parasternal Heave +ve B.Sounds, Abd Soft, Non tender, No organomegaly appriciated, No rebound -guarding or rigidity. No Cyanosis, Clubbing or edema, No new Rash or bruise   PERTINENT RADIOLOGIC STUDIES: No results found.   PERTINENT LAB RESULTS: CBC: Recent Labs    07/21/22 0807 07/22/22 0233  WBC 9.7 7.4  HGB 9.1* 8.7*  HCT 28.3* 25.7*  PLT 99* 104*   CMET CMP     Component Value Date/Time   NA 133 (L) 07/23/2022 0125   NA 138 05/22/2020 1451   K 3.9 07/23/2022 0125   CL 100 07/23/2022 0125   CO2 25 07/23/2022 0125   GLUCOSE 114 (H) 07/23/2022 0125   BUN <5 (L) 07/23/2022 0125   BUN 14 05/22/2020 1451    CREATININE 0.70 07/23/2022 0125   CREATININE 0.65 11/08/2012 1226   CALCIUM 8.1 (L) 07/23/2022 0125   PROT 5.6 (L) 07/22/2022 0233   PROT 7.5 05/22/2020 1451   ALBUMIN 2.9 (L) 07/22/2022 0233   ALBUMIN 4.7 05/22/2020 1451   AST 63 (H) 07/22/2022 0233   ALT 40 07/22/2022 0233   ALKPHOS 84 07/22/2022 0233   BILITOT 0.4 07/22/2022 0233   BILITOT 0.3 05/22/2020 1451   GFRNONAA >60 07/23/2022 0125   GFRNONAA >89 11/08/2012 1226   GFRAA 126 05/22/2020 1451   GFRAA >89 11/08/2012 1226    GFR Estimated Creatinine Clearance: 92.4 mL/min (by C-G formula based on SCr of 0.7 mg/dL). No results for input(s): "LIPASE", "AMYLASE" in the last 72 hours. Recent Labs    07/22/22 0233  CKTOTAL 1,037*   Invalid input(s): "POCBNP" No results for input(s): "DDIMER" in the last 72 hours. No results for input(s): "HGBA1C" in the last 72 hours. No results for input(s): "CHOL", "HDL", "LDLCALC", "TRIG", "CHOLHDL", "LDLDIRECT" in the last 72 hours. No results for input(s): "TSH", "T4TOTAL", "T3FREE", "THYROIDAB" in the last 72 hours.  Invalid input(s): "FREET3" No results for input(s): "VITAMINB12", "FOLATE", "FERRITIN", "TIBC", "IRON", "RETICCTPCT" in the last 72 hours. Coags: No results for input(s): "INR" in the last 72 hours.  Invalid input(s): "PT" Microbiology: Recent Results (from the past 240 hour(s))  Resp panel by RT-PCR (RSV, Flu A&B, Covid) Anterior Nasal Swab     Status: None   Collection Time: 07/19/22  8:18 PM   Specimen: Anterior Nasal Swab  Result Value Ref Range Status   SARS Coronavirus 2 by RT PCR NEGATIVE NEGATIVE Final   Influenza A by PCR NEGATIVE NEGATIVE Final   Influenza B by PCR  NEGATIVE NEGATIVE Final    Comment: (NOTE) The Xpert Xpress SARS-CoV-2/FLU/RSV plus assay is intended as an aid in the diagnosis of influenza from Nasopharyngeal swab specimens and should not be used as a sole basis for treatment. Nasal washings and aspirates are unacceptable for Xpert Xpress  SARS-CoV-2/FLU/RSV testing.  Fact Sheet for Patients: EntrepreneurPulse.com.au  Fact Sheet for Healthcare Providers: IncredibleEmployment.be  This test is not yet approved or cleared by the [place] FDA and has been authorized for detection and/or diagnosis of SARS-CoV-2 by FDA under an Emergency Use Authorization (EUA). This EUA will remain in effect (meaning this test can be used) for the duration of the COVID-19 declaration under Section 564(b)(1) of the Act, 21 U.S.C. section 360bbb-3(b)(1), unless the authorization is terminated or revoked.     Resp Syncytial Virus by PCR NEGATIVE NEGATIVE Final    Comment: (NOTE) Fact Sheet for Patients: EntrepreneurPulse.com.au  Fact Sheet for Healthcare Providers: IncredibleEmployment.be  This test is not yet approved or cleared by the [place] FDA and has been authorized for detection and/or diagnosis of SARS-CoV-2 by FDA under an Emergency Use Authorization (EUA). This EUA will remain in effect (meaning this test can be used) for the duration of the COVID-19 declaration under Section 564(b)(1) of the Act, 21 U.S.C. section 360bbb-3(b)(1), unless the authorization is terminated or revoked.  Performed at [facility], [place] [address] [place],  [address]     FURTHER DISCHARGE INSTRUCTIONS:  Get Medicines reviewed and adjusted: Please take all your medications with you for your next visit with your Primary MD  Laboratory/radiological data: Please request your Primary MD to go over all hospital tests and procedure/radiological results at the follow up, please ask your Primary MD to get all Hospital records sent to his/her office.  In some cases, they will be blood work, cultures and biopsy results pending at the time of your discharge. Please request that your primary care M.D. goes through all the records of your hospital data and follows  up on these results.  Also Note the following: If you experience worsening of your admission symptoms, develop shortness of breath, life threatening emergency, suicidal or homicidal thoughts you must seek medical attention immediately by calling 911 or calling your MD immediately  if symptoms less severe.  You must read complete instructions/literature along with all the possible adverse reactions/side effects for all the Medicines you take and that have been prescribed to you. Take any new Medicines after you have completely understood and accpet all the possible adverse reactions/side effects.   Do not drive when taking Pain medications or sleeping medications (Benzodaizepines)  Do not take more than prescribed Pain, Sleep and Anxiety Medications. It is not advisable to combine anxiety,sleep and pain medications without talking with your primary care practitioner  Special Instructions: If you have smoked or chewed Tobacco  in the last 2 yrs please stop smoking, stop any regular Alcohol  and or any Recreational drug use.  Wear Seat belts while driving.  Please note: You were cared for by a hospitalist during your hospital stay. Once you are discharged, your primary care physician will handle any further medical issues. Please note that NO REFILLS for any discharge medications will be authorized once you are discharged, as it is imperative that you return to your primary care physician (or establish a relationship with a primary care physician if you do not have one) for your post hospital discharge needs so that they can reassess your need for medications  and monitor your lab values.  Total Time spent coordinating discharge including counseling, education and face to face time equals greater than 30 minutes.  SignedOren Binet 07/23/2022 12:54 PM

## 2022-07-23 NOTE — Progress Notes (Signed)
Attempted to call Ms [name]. . NO response noted. Mail box  messages full.
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
## 2022-08-31 IMAGING — CR DG CHEST 2V
2 series · 2 of 2 positions shown · non-contrast
Comparison: 03/05/2020

CLINICAL DATA: Chest pain

EXAM:
CHEST - 2 VIEW

[chest pa]
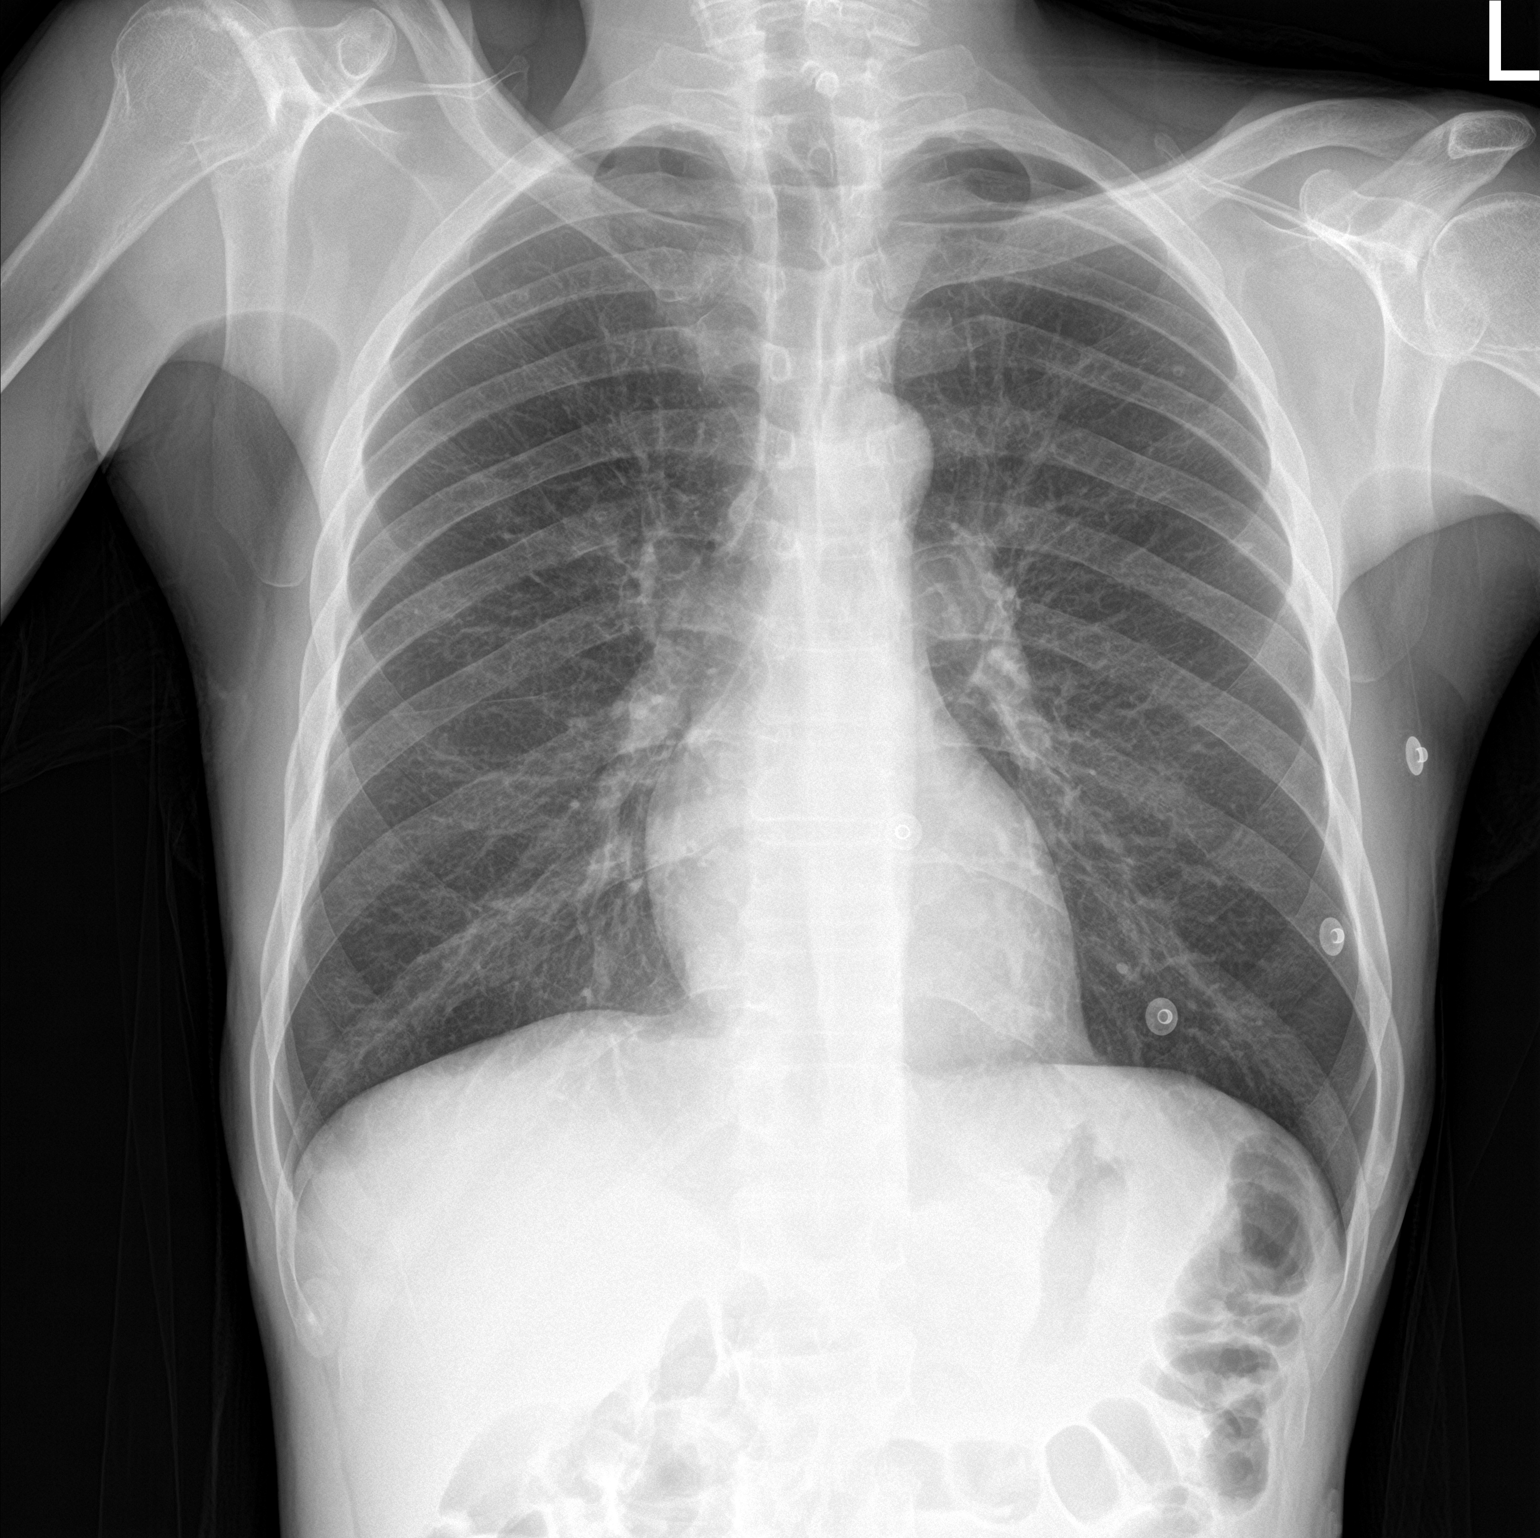

[chest lat]
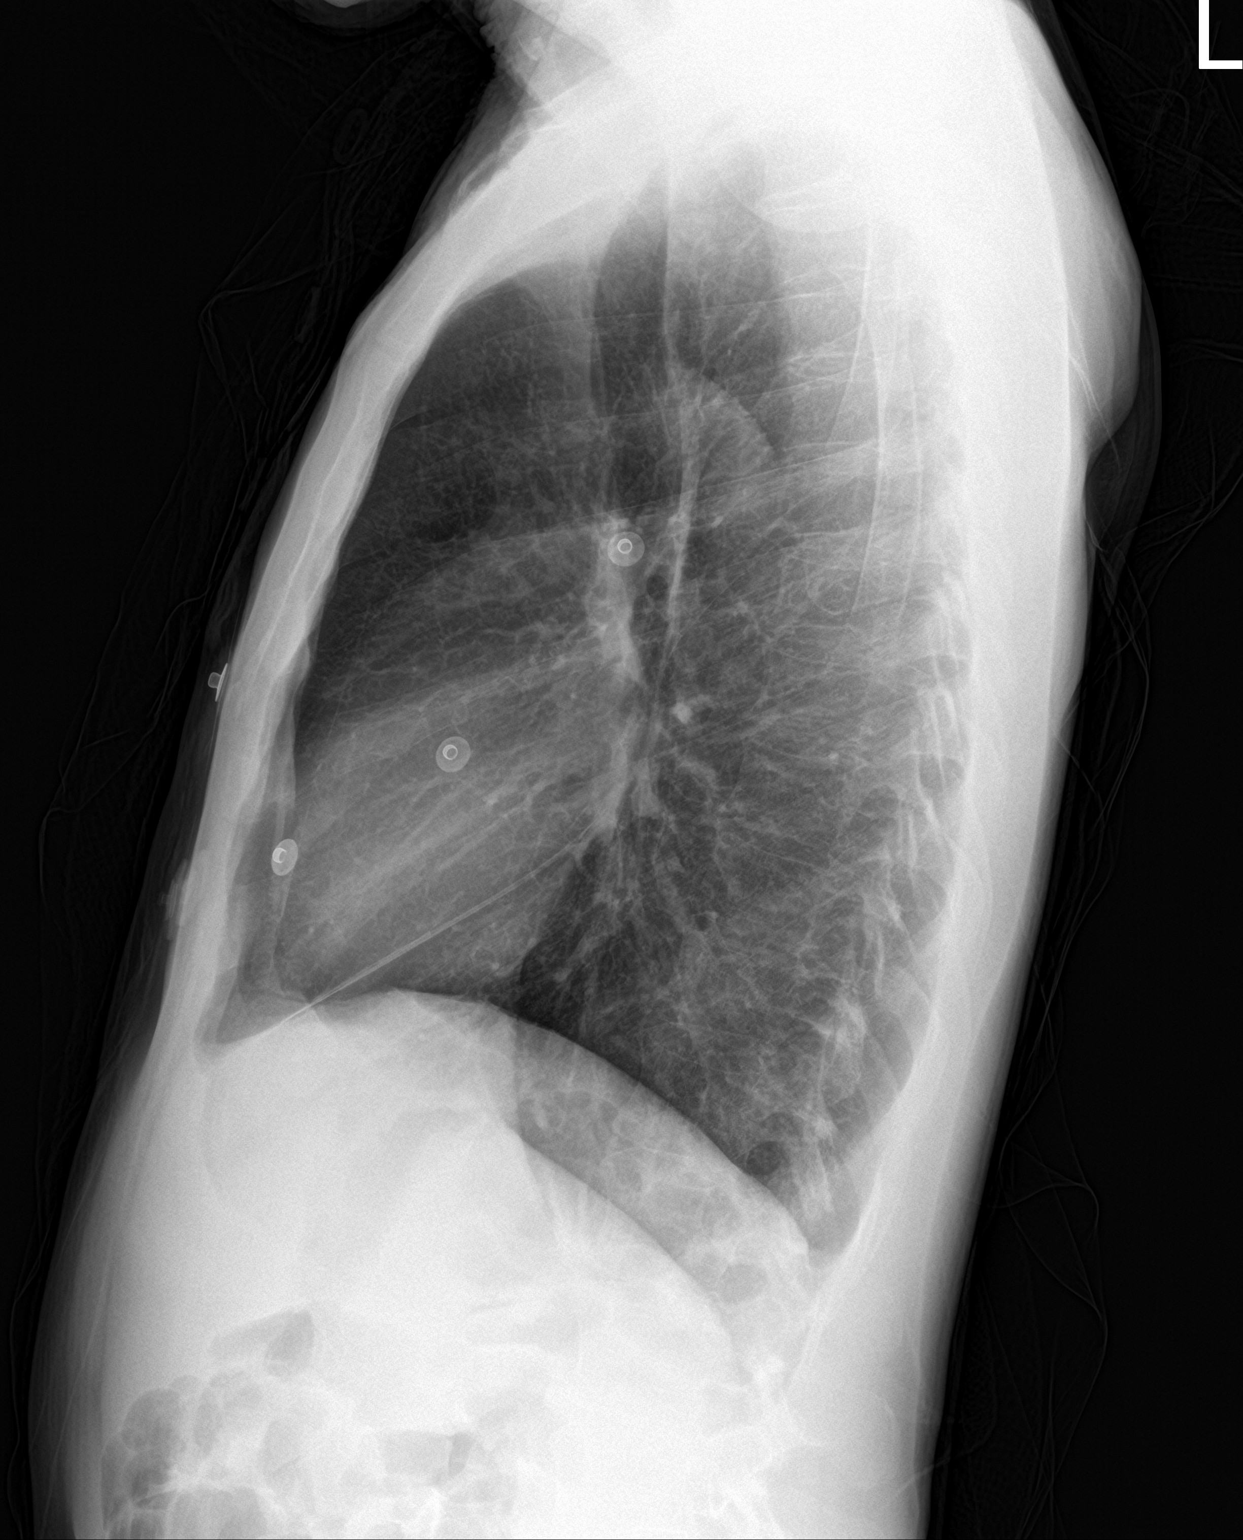

[2 of 2 positions shown; findings below may reference images not displayed]

FINDINGS: The heart size and mediastinal contours are within normal limits.
Both lungs are clear. The visualized skeletal structures are
unremarkable.
IMPRESSION: Negative.

## 2022-09-01 ENCOUNTER — Emergency Department (HOSPITAL_COMMUNITY)
Admission: EM | Admit: 2022-09-01 | Discharge: 2022-09-01 | Disposition: A | Discharge status: Home / Self Care | Payer: Medicaid Other | Attending: Emergency Medicine | Admitting: Emergency Medicine

## 2022-09-01 ENCOUNTER — Other Ambulatory Visit: Payer: Self-pay

## 2022-09-01 ENCOUNTER — Emergency Department (HOSPITAL_COMMUNITY): Payer: Medicaid Other

## 2022-09-01 ENCOUNTER — Encounter (HOSPITAL_COMMUNITY): Payer: Self-pay | Admitting: Emergency Medicine

## 2022-09-01 DIAGNOSIS — R079 Chest pain, unspecified: Secondary | ICD-10-CM | POA: Diagnosis present

## 2022-09-01 DIAGNOSIS — K209 Esophagitis, unspecified without bleeding: Secondary | ICD-10-CM | POA: Diagnosis not present

## 2022-09-01 DIAGNOSIS — Z91040 Latex allergy status: Secondary | ICD-10-CM | POA: Insufficient documentation

## 2022-09-01 DIAGNOSIS — K860 Alcohol-induced chronic pancreatitis: Secondary | ICD-10-CM | POA: Diagnosis not present

## 2022-09-01 LAB — URINALYSIS, ROUTINE W REFLEX MICROSCOPIC
Bilirubin Urine: NEGATIVE
Glucose, UA: NEGATIVE mg/dL
Hgb urine dipstick: NEGATIVE
Ketones, ur: NEGATIVE mg/dL
Leukocytes,Ua: NEGATIVE
Nitrite: NEGATIVE
Protein, ur: 30 mg/dL — AB
Specific Gravity, Urine: >1.03 — ABNORMAL HIGH (ref 1.005–1.030)
pH: 5.5 (ref 5.0–8.0)

## 2022-09-01 LAB — TROPONIN I (HIGH SENSITIVITY)
Troponin I (High Sensitivity): 4 ng/L (ref <18)
Troponin I (High Sensitivity): 5 ng/L (ref <18)

## 2022-09-01 LAB — URINALYSIS, MICROSCOPIC (REFLEX): RBC / HPF: NONE SEEN RBC/hpf (ref 0–5)

## 2022-09-01 LAB — CBC
HCT: 36.6 % — ABNORMAL LOW (ref 39.0–52.0)
Hemoglobin: 11.7 g/dL — ABNORMAL LOW (ref 13.0–17.0)
MCH: 31.8 pg (ref 26.0–34.0)
MCHC: 32 g/dL (ref 30.0–36.0)
MCV: 99.5 fL (ref 80.0–100.0)
Platelets: 188 10*3/uL (ref 150–400)
RBC: 3.68 MIL/uL — ABNORMAL LOW (ref 4.22–5.81)
RDW: 17.5 % — ABNORMAL HIGH (ref 11.5–15.5)
WBC: 7.1 10*3/uL (ref 4.0–10.5)
nRBC: 0 % (ref 0.0–0.2)

## 2022-09-01 LAB — BASIC METABOLIC PANEL
Anion gap: 10 (ref 5–15)
BUN: 5 mg/dL — ABNORMAL LOW (ref 6–20)
CO2: 13 mmol/L — ABNORMAL LOW (ref 22–32)
Calcium: 7.3 mg/dL — ABNORMAL LOW (ref 8.9–10.3)
Chloride: 112 mmol/L — ABNORMAL HIGH (ref 98–111)
Creatinine, Ser: 0.66 mg/dL (ref 0.61–1.24)
GFR, Estimated: >60 mL/min (ref >60)
Glucose, Bld: 84 mg/dL (ref 70–99)
Potassium: 4.5 mmol/L (ref 3.5–5.1)
Sodium: 135 mmol/L (ref 135–145)

## 2022-09-01 LAB — LIPASE, BLOOD: Lipase: 20 U/L (ref 11–51)

## 2022-09-01 MED ORDER — SODIUM BICARBONATE 8.4 % IV SOLN
50.0000 meq | Freq: Once | INTRAVENOUS | Status: AC
  Administered 2022-09-01: 50 meq via INTRAVENOUS
  Filled 2022-09-01: qty 50

## 2022-09-01 MED ORDER — LIDOCAINE VISCOUS HCL 2 % MT SOLN
15.0000 mL | Freq: Once | OROMUCOSAL | Status: AC
  Administered 2022-09-01: 15 mL via ORAL
  Filled 2022-09-01: qty 15

## 2022-09-01 MED ORDER — SODIUM CHLORIDE 0.9 % IV BOLUS
1000.0000 mL | Freq: Once | INTRAVENOUS | Status: AC
  Administered 2022-09-01: 1000 mL via INTRAVENOUS

## 2022-09-01 MED ORDER — HYDROMORPHONE HCL 1 MG/ML IJ SOLN
1.0000 mg | Freq: Once | INTRAMUSCULAR | Status: AC
  Administered 2022-09-01: 1 mg via INTRAVENOUS
  Filled 2022-09-01: qty 1

## 2022-09-01 MED ORDER — CALCIUM GLUCONATE-NACL 1-0.675 GM/50ML-% IV SOLN
1.0000 g | Freq: Once | INTRAVENOUS | Status: AC
  Administered 2022-09-01: 1000 mg via INTRAVENOUS
  Filled 2022-09-01: qty 50

## 2022-09-01 MED ORDER — FAMOTIDINE IN NACL 20-0.9 MG/50ML-% IV SOLN
20.0000 mg | Freq: Once | INTRAVENOUS | Status: AC
  Administered 2022-09-01: 20 mg via INTRAVENOUS
  Filled 2022-09-01: qty 50

## 2022-09-01 MED ORDER — ONDANSETRON 4 MG PO TBDP
4.0000 mg | ORAL_TABLET | Freq: Once | ORAL | Status: AC
  Administered 2022-09-01: 4 mg via ORAL
  Filled 2022-09-01: qty 1

## 2022-09-01 MED ORDER — ONDANSETRON 4 MG PO TBDP: ORAL_TABLET | ORAL | 0 refills | Status: DC

## 2022-09-01 MED ORDER — HYDROCODONE-ACETAMINOPHEN 5-325 MG PO TABS: 1.0000 | ORAL_TABLET | Freq: Four times a day (QID) | ORAL | 0 refills | Status: DC | PRN

## 2022-09-01 MED ORDER — ALUM & MAG HYDROXIDE-SIMETH 200-200-20 MG/5ML PO SUSP
30.0000 mL | Freq: Once | ORAL | Status: AC
  Administered 2022-09-01: 30 mL via ORAL
  Filled 2022-09-01: qty 30

## 2022-09-01 NOTE — ED Provider Notes (Signed)
[place] EMERGENCY DEPARTMENT AT [facility] Provider Note   CSN: [id number] Arrival date & time: 09/01/22  1700     History  Chief Complaint  Patient presents with   Chest Pain    [name] is a 52 y.o. male hx of alcohol abuse, chronic pancreatitis here presenting with possible pancreatitis flareup.  Patient states that he continues to drink alcohol and last drink was yesterday.  Patient states that he has been having epigastric pain.  He also ran out of his viscous lidocaine as well as pain medicine.  He states that the pain radiated to his chest.  He states that this is very similar to his previous pancreatitis.  The history is provided by the patient.       Home Medications Prior to Admission medications   Medication Sig Start Date End Date Taking? Authorizing Provider  acetaminophen (TYLENOL) 650 MG CR tablet Take 1 tablet (650 mg total) by mouth every 8 (eight) hours as needed for pain. 07/23/22   Ghimire, Werner Lean, MD  albuterol (VENTOLIN HFA) 108 (90 Base) MCG/ACT inhaler Inhale 2 puffs into the lungs every 4 (four) hours as needed for wheezing or shortness of breath. 07/23/22   Ghimire, Werner Lean, MD  amiodarone (PACERONE) 200 MG tablet 200 mg twice daily x 1 week, then switch to 200 mg daily 07/23/22   Ghimire, Werner Lean, MD  carvedilol (COREG) 12.5 MG tablet Take 1 tablet (12.5 mg total) by mouth 2 (two) times daily with a meal. 07/23/22   Ghimire, Werner Lean, MD  DULoxetine (CYMBALTA) 30 MG capsule Take 2 capsules (60 mg total) by mouth at bedtime. 07/23/22   Ghimire, Werner Lean, MD  folic acid (FOLVITE) 1 MG tablet Take 1 tablet (1 mg total) by mouth daily. 07/23/22   Ghimire, Werner Lean, MD  HYDROcodone-acetaminophen (NORCO/VICODIN) 5-325 MG tablet Take 1-2 tablets by mouth every 12 (twelve) hours as needed for moderate pain. 07/23/22   Ghimire, Werner Lean, MD  lidocaine (XYLOCAINE) 2 % solution Use as directed 15 mLs in the mouth or throat every 6 (six)  hours as needed for mouth pain. 07/23/22   Ghimire, Werner Lean, MD  lipase/protease/amylase (CREON) 36000 UNITS CPEP capsule Take 2 capsules (72,000 Units total) by mouth 3 (three) times daily with meals. May also take 1 capsule (36,000 Units total) as needed (with snacks). 07/23/22   Ghimire, Werner Lean, MD  loperamide (IMODIUM) 2 MG capsule Take 1 capsule (2 mg total) by mouth 4 (four) times daily as needed for diarrhea or loose stools. 07/23/22   Ghimire, Werner Lean, MD  magnesium oxide (MAG-OX) 400 (240 Mg) MG tablet Take 1 tablet (400 mg total) by mouth daily. 07/23/22   Ghimire, Werner Lean, MD  Multiple Vitamin (MULTIVITAMIN WITH MINERALS) TABS tablet Take 1 tablet by mouth daily. 07/23/22   Ghimire, Werner Lean, MD  nicotine (NICODERM CQ - DOSED IN MG/24 HOURS) 21 mg/24hr patch Place 1 patch (21 mg total) onto the skin at bedtime. 07/23/22   Ghimire, Werner Lean, MD  pantoprazole (PROTONIX) 40 MG tablet Take 1 tablet (40 mg total) by mouth 2 (two) times daily. 07/23/22   Ghimire, Werner Lean, MD  sucralfate (CARAFATE) 1 GM/10ML suspension Take 10 mLs (1 g total) by mouth 4 (four) times daily -  with meals and at bedtime. 07/23/22 08/22/22  Ghimire, Werner Lean, MD  thiamine (VITAMIN B1) 100 MG tablet Take 1 tablet (100 mg total) by mouth daily. 07/23/22   Ghimire,  Werner Lean, MD  Vitamin D, Ergocalciferol, (DRISDOL) 1.25 MG (50000 UNIT) CAPS capsule Take 1 capsule (50,000 Units total) by mouth every Wednesday. 07/27/22   Ghimire, Werner Lean, MD  amitriptyline (ELAVIL) 25 MG tablet Take 1 tablet (25 mg total) by mouth at bedtime. Patient not taking: Reported on 08/08/2019 10/15/18 08/08/19  Rolly Salter, MD      Allergies    Robaxin [methocarbamol], Aspirin, Shellfish-derived products, Trazodone and nefazodone, Adhesive [tape], Latex, Toradol [ketorolac tromethamine], Contrast media [iodinated contrast media], Reglan [metoclopramide], and Salmon [fish oil]    Review of Systems   Review of Systems  Cardiovascular:   Positive for chest pain.  Gastrointestinal:  Positive for abdominal pain.  All other systems reviewed and are negative.   Physical Exam Updated Vital Signs BP (!) 164/116 (BP Location: Right Arm)   Pulse (!) 114   Temp 98.4 F (36.9 C) (Oral)   Resp 18   Ht  (1.753 m)   Wt 61 kg   SpO2 100%   BMI 19.86 kg/m  Physical Exam Vitals and nursing note reviewed.  Constitutional:      Comments: Uncomfortable  HENT:     Head: Normocephalic.  Eyes:     Extraocular Movements: Extraocular movements intact.     Pupils: Pupils are equal, round, and reactive to light.  Cardiovascular:     Rate and Rhythm: Normal rate and regular rhythm.     Heart sounds: Normal heart sounds.  Pulmonary:     Effort: Pulmonary effort is normal.     Breath sounds: Normal breath sounds.  Abdominal:     Palpations: Abdomen is soft.     Comments: + epigastric tenderness   Musculoskeletal:        General: Normal range of motion.     Cervical back: Normal range of motion and neck supple.  Skin:    General: Skin is warm.     Capillary Refill: Capillary refill takes less than 2 seconds.  Neurological:     General: No focal deficit present.     Mental Status: He is alert and oriented to person, place, and time.  Psychiatric:        Mood and Affect: Mood normal.        Behavior: Behavior normal.     ED Results / Procedures / Treatments   Labs (all labs ordered are listed, but only abnormal results are displayed) Labs Reviewed  BASIC METABOLIC PANEL - Abnormal; Notable for the following components:      Result Value   Chloride 112 (*)    CO2 13 (*)    BUN 5 (*)    Calcium 7.3 (*)    All other components within normal limits  CBC - Abnormal; Notable for the following components:   RBC 3.68 (*)    Hemoglobin 11.7 (*)    HCT 36.6 (*)    RDW 17.5 (*)    All other components within normal limits  URINALYSIS, ROUTINE W REFLEX MICROSCOPIC - Abnormal; Notable for the following components:    Specific Gravity, Urine >1.030 (*)    Protein, ur 30 (*)    All other components within normal limits  URINALYSIS, MICROSCOPIC (REFLEX) - Abnormal; Notable for the following components:   Bacteria, UA RARE (*)    All other components within normal limits  LIPASE, BLOOD  RAPID URINE DRUG SCREEN, HOSP PERFORMED  HEPATIC FUNCTION PANEL  TROPONIN I (HIGH SENSITIVITY)  TROPONIN I (HIGH SENSITIVITY)    EKG None  Radiology DG Chest 2 View  Result Date: 09/01/2022 CLINICAL DATA:  Chest pain EXAM: CHEST - 2 VIEW COMPARISON:  None Available. FINDINGS: The heart size and mediastinal contours are within normal limits. No consolidation, pneumothorax or effusion. No edema. The visualized skeletal structures are unremarkable. Dense focus along the anterior aspect of the left fourth rib is stable, possible bone island. IMPRESSION: No acute cardiopulmonary disease Electronically Signed   By: Karen Kays M.D.   On: 09/01/2022 17:46    Procedures Procedures    Medications Ordered in ED Medications  calcium gluconate 1 g/ 50 mL sodium chloride IVPB (1,000 mg Intravenous New Bag/Given 09/01/22 2200)  sodium bicarbonate injection 50 mEq (has no administration in time range)  ondansetron (ZOFRAN-ODT) disintegrating tablet 4 mg (4 mg Oral Given 09/01/22 1734)  sodium chloride 0.9 % bolus 1,000 mL (1,000 mLs Intravenous New Bag/Given 09/01/22 2155)  HYDROmorphone (DILAUDID) injection 1 mg (1 mg Intravenous Given 09/01/22 2157)    ED Course/ Medical Decision Making/ A&P                             Medical Decision Making [name] is a 52 y.o. male here presenting with abdominal pain.  Patient has been drinking alcohol and has alcohol induced pancreatitis previously.  Plan to get CBC and CMP and lipase and troponin x 2.  Will also get CT abdomen pelvis.  Will hydrate and give pain meds and reassess.    11:02 PM Reviewed patient's labs and they were unremarkable.  Chest x-ray is clear.   Lipase is normal.  Calcium is 7.3 and it's supplemented.  Signed out to Dr. Blinda Leatherwood pending CT abdomen pelvis.  If CT is unremarkable anticipate he can be discharged with pain medicine.  Amount and/or Complexity of Data Reviewed Labs: ordered. Radiology: ordered.  Risk OTC drugs. Prescription drug management.    Final Clinical Impression(s) / ED Diagnoses Final diagnoses:  None    Rx / DC Orders ED Discharge Orders     None         Charlynne Pander, MD 09/01/22 2311

## 2022-09-01 NOTE — Discharge Instructions (Addendum)
You likely have chronic pancreatitis.  I sent in the prescription of Zofran for nausea and Norco for severe pain.  You need to stop drinking alcohol  See your doctor for follow-up  Return to ER if you have worse abdominal pain or vomiting or fever

## 2022-09-01 NOTE — ED Provider Triage Note (Addendum)
Emergency Medicine Provider Triage Evaluation Note  [name] , a 52 y.o. male  was evaluated in triage.  Pt complains of chest and abdominal pain. Describes it as pancreas pain. Has been having nausea and diarrhea, no improvement with immodium. Was taken off metoprolol after he had a seizure, was changed to carvedilol and amlodipine. States EMS couldn't give him nitro because he took ED meds earlier today. Reports alcohol use yesterday, had some chocolate yesterday too which he says flares his symptoms. Used marijuana 2 days ago, has not used cocaine in a month and a half.   Review of Systems  Positive: Chest and abd pain, nausea, diarrhea Negative:   Physical Exam  BP (!) 164/116 (BP Location: Right Arm)   Pulse (!) 114   Temp 98.4 F (36.9 C) (Oral)   Resp 18   Ht  (1.753 m)   Wt 61 kg   SpO2 100%   BMI 19.86 kg/m  Gen:   Awake, no distress   Resp:  Normal effort  MSK:   Moves extremities without difficulty  Other:    Medical Decision Making  Medically screening exam initiated at 5:20 PM.  Appropriate orders placed.  [name] was informed that the remainder of the evaluation will be completed by another provider, this initial triage assessment does not replace that evaluation, and the importance of remaining in the ED until their evaluation is complete.  Workup initiated      Su Monks, PA-C 09/01/22 1723

## 2022-09-01 NOTE — ED Triage Notes (Addendum)
Patient from home BIB [facility] w/ complaints of chest pain that started this afternoon at 1600. Patient states he was walking while this happened. Describes it centrally, stabbing radiating to the left side of the neck. Patient also endorses he feels like his pancreatitis is acting up, is having L sided abdominal pain point to the umbilicus area and states he is having diarrhea. Endorses 2 12 oz beers consumed yesterday.   BP- 180/120s  HR-103   Patient reports taking viagra this am so EMS did not admin nitro  Also endorses hx of ulcers and did not take ASA offered by EMS.

## 2022-09-13 ENCOUNTER — Telehealth: Payer: Self-pay | Admitting: Gastroenterology

## 2022-09-13 NOTE — Telephone Encounter (Signed)
Patient called stating that he needs refills for his Protonix and Promethazine medications sent to [facility] on [address]. Please advise, thank you.

## 2022-09-14 MED ORDER — SUCRALFATE 1 GM/10ML PO SUSP: 1.0000 g | Freq: Three times a day (TID) | ORAL | 2 refills | Status: DC

## 2022-09-14 MED ORDER — ONDANSETRON 4 MG PO TBDP: 4.0000 mg | ORAL_TABLET | Freq: Three times a day (TID) | ORAL | 0 refills | Status: DC | PRN

## 2022-09-14 MED ORDER — PANCRELIPASE (LIP-PROT-AMYL) 36000-114000 UNITS PO CPEP: ORAL_CAPSULE | ORAL | 2 refills | Status: DC

## 2022-09-14 NOTE — Telephone Encounter (Signed)
Patient calling states he also needs his Creon Rx. Please advise

## 2022-09-14 NOTE — Telephone Encounter (Signed)
Called and spoke to patient.  He has refills on his Protonix from another Dr. At [facility]. Dr. Adela Lank has not prescribed promethazine for him.  He said he would like a refill of Zofran instead.  And he requested refills of sucralfate and creon. OK per Dr. Adela Lank.  Refills sent

## 2022-09-18 ENCOUNTER — Emergency Department (HOSPITAL_COMMUNITY): Payer: Medicaid Other

## 2022-09-18 ENCOUNTER — Other Ambulatory Visit: Payer: Self-pay

## 2022-09-18 ENCOUNTER — Inpatient Hospital Stay (HOSPITAL_COMMUNITY)
Admission: EM | Admit: 2022-09-18 | Discharge: 2022-09-21 | DRG: 439 | Disposition: A | Discharge status: Home / Self Care | Payer: Medicaid Other | Attending: Internal Medicine | Admitting: Internal Medicine

## 2022-09-18 ENCOUNTER — Encounter (HOSPITAL_COMMUNITY): Payer: Self-pay | Admitting: *Deleted

## 2022-09-18 DIAGNOSIS — F17210 Nicotine dependence, cigarettes, uncomplicated: Secondary | ICD-10-CM | POA: Diagnosis present

## 2022-09-18 DIAGNOSIS — G8929 Other chronic pain: Secondary | ICD-10-CM | POA: Diagnosis present

## 2022-09-18 DIAGNOSIS — F1020 Alcohol dependence, uncomplicated: Secondary | ICD-10-CM | POA: Diagnosis present

## 2022-09-18 DIAGNOSIS — Z91041 Radiographic dye allergy status: Secondary | ICD-10-CM

## 2022-09-18 DIAGNOSIS — E876 Hypokalemia: Secondary | ICD-10-CM | POA: Diagnosis present

## 2022-09-18 DIAGNOSIS — Z888 Allergy status to other drugs, medicaments and biological substances status: Secondary | ICD-10-CM

## 2022-09-18 DIAGNOSIS — Z9102 Food additives allergy status: Secondary | ICD-10-CM

## 2022-09-18 DIAGNOSIS — F319 Bipolar disorder, unspecified: Secondary | ICD-10-CM | POA: Diagnosis present

## 2022-09-18 DIAGNOSIS — J45909 Unspecified asthma, uncomplicated: Secondary | ICD-10-CM | POA: Diagnosis present

## 2022-09-18 DIAGNOSIS — E162 Hypoglycemia, unspecified: Secondary | ICD-10-CM | POA: Diagnosis present

## 2022-09-18 DIAGNOSIS — Z91048 Other nonmedicinal substance allergy status: Secondary | ICD-10-CM

## 2022-09-18 DIAGNOSIS — Z811 Family history of alcohol abuse and dependence: Secondary | ICD-10-CM

## 2022-09-18 DIAGNOSIS — Z91040 Latex allergy status: Secondary | ICD-10-CM

## 2022-09-18 DIAGNOSIS — E869 Volume depletion, unspecified: Secondary | ICD-10-CM | POA: Diagnosis present

## 2022-09-18 DIAGNOSIS — E872 Acidosis, unspecified: Secondary | ICD-10-CM | POA: Diagnosis present

## 2022-09-18 DIAGNOSIS — I480 Paroxysmal atrial fibrillation: Secondary | ICD-10-CM

## 2022-09-18 DIAGNOSIS — Z808 Family history of malignant neoplasm of other organs or systems: Secondary | ICD-10-CM

## 2022-09-18 DIAGNOSIS — N179 Acute kidney failure, unspecified: Secondary | ICD-10-CM | POA: Diagnosis present

## 2022-09-18 DIAGNOSIS — Z79899 Other long term (current) drug therapy: Secondary | ICD-10-CM | POA: Diagnosis not present

## 2022-09-18 DIAGNOSIS — Z91013 Allergy to seafood: Secondary | ICD-10-CM | POA: Diagnosis not present

## 2022-09-18 DIAGNOSIS — K859 Acute pancreatitis without necrosis or infection, unspecified: Principal | ICD-10-CM | POA: Diagnosis present

## 2022-09-18 DIAGNOSIS — Z885 Allergy status to narcotic agent status: Secondary | ICD-10-CM

## 2022-09-18 DIAGNOSIS — F1420 Cocaine dependence, uncomplicated: Secondary | ICD-10-CM | POA: Diagnosis present

## 2022-09-18 DIAGNOSIS — I456 Pre-excitation syndrome: Secondary | ICD-10-CM | POA: Diagnosis present

## 2022-09-18 DIAGNOSIS — K861 Other chronic pancreatitis: Secondary | ICD-10-CM | POA: Diagnosis not present

## 2022-09-18 DIAGNOSIS — K219 Gastro-esophageal reflux disease without esophagitis: Secondary | ICD-10-CM | POA: Diagnosis present

## 2022-09-18 DIAGNOSIS — I10 Essential (primary) hypertension: Secondary | ICD-10-CM | POA: Diagnosis present

## 2022-09-18 DIAGNOSIS — Z886 Allergy status to analgesic agent status: Secondary | ICD-10-CM | POA: Diagnosis not present

## 2022-09-18 DIAGNOSIS — Z8249 Family history of ischemic heart disease and other diseases of the circulatory system: Secondary | ICD-10-CM

## 2022-09-18 DIAGNOSIS — E7800 Pure hypercholesterolemia, unspecified: Secondary | ICD-10-CM | POA: Diagnosis present

## 2022-09-18 DIAGNOSIS — D573 Sickle-cell trait: Secondary | ICD-10-CM | POA: Diagnosis present

## 2022-09-18 LAB — I-STAT VENOUS BLOOD GAS, ED
Acid-base deficit: 11 mmol/L — ABNORMAL HIGH (ref 0.0–2.0)
Bicarbonate: 13.9 mmol/L — ABNORMAL LOW (ref 20.0–28.0)
Calcium, Ion: 1.11 mmol/L — ABNORMAL LOW (ref 1.15–1.40)
HCT: 49 % (ref 39.0–52.0)
Hemoglobin: 16.7 g/dL (ref 13.0–17.0)
O2 Saturation: 42 %
Potassium: 3.6 mmol/L (ref 3.5–5.1)
Sodium: 134 mmol/L — ABNORMAL LOW (ref 135–145)
TCO2: 15 mmol/L — ABNORMAL LOW (ref 22–32)
pCO2, Ven: 29.9 mmHg — ABNORMAL LOW (ref 44–60)
pH, Ven: 7.276 (ref 7.25–7.43)
pO2, Ven: 27 mmHg — CL (ref 32–45)

## 2022-09-18 LAB — COMPREHENSIVE METABOLIC PANEL
ALT: 41 U/L (ref 0–44)
AST: 87 U/L — ABNORMAL HIGH (ref 15–41)
Albumin: 4.1 g/dL (ref 3.5–5.0)
Alkaline Phosphatase: 80 U/L (ref 38–126)
Anion gap: 18 — ABNORMAL HIGH (ref 5–15)
BUN: 15 mg/dL (ref 6–20)
CO2: 11 mmol/L — ABNORMAL LOW (ref 22–32)
Calcium: 8.7 mg/dL — ABNORMAL LOW (ref 8.9–10.3)
Chloride: 102 mmol/L (ref 98–111)
Creatinine, Ser: 1.43 mg/dL — ABNORMAL HIGH (ref 0.61–1.24)
GFR, Estimated: 59 mL/min — ABNORMAL LOW (ref >60)
Glucose, Bld: 104 mg/dL — ABNORMAL HIGH (ref 70–99)
Potassium: 3.4 mmol/L — ABNORMAL LOW (ref 3.5–5.1)
Sodium: 131 mmol/L — ABNORMAL LOW (ref 135–145)
Total Bilirubin: 0.7 mg/dL (ref 0.3–1.2)
Total Protein: 8 g/dL (ref 6.5–8.1)

## 2022-09-18 LAB — LACTIC ACID, PLASMA: Lactic Acid, Venous: 2.1 mmol/L (ref 0.5–1.9)

## 2022-09-18 LAB — CBC WITH DIFFERENTIAL/PLATELET
Abs Immature Granulocytes: 0.04 10*3/uL (ref 0.00–0.07)
Basophils Absolute: 0 10*3/uL (ref 0.0–0.1)
Basophils Relative: 0 %
Eosinophils Absolute: 0.1 10*3/uL (ref 0.0–0.5)
Eosinophils Relative: 1 %
HCT: 40.9 % (ref 39.0–52.0)
Hemoglobin: 13.5 g/dL (ref 13.0–17.0)
Immature Granulocytes: 0 %
Lymphocytes Relative: 27 %
Lymphs Abs: 2.6 10*3/uL (ref 0.7–4.0)
MCH: 31 pg (ref 26.0–34.0)
MCHC: 33 g/dL (ref 30.0–36.0)
MCV: 94 fL (ref 80.0–100.0)
Monocytes Absolute: 1.1 10*3/uL — ABNORMAL HIGH (ref 0.1–1.0)
Monocytes Relative: 12 %
Neutro Abs: 5.6 10*3/uL (ref 1.7–7.7)
Neutrophils Relative %: 60 %
Platelets: 243 10*3/uL (ref 150–400)
RBC: 4.35 MIL/uL (ref 4.22–5.81)
RDW: 15.6 % — ABNORMAL HIGH (ref 11.5–15.5)
WBC: 9.4 10*3/uL (ref 4.0–10.5)
nRBC: 0 % (ref 0.0–0.2)

## 2022-09-18 LAB — ETHANOL: Alcohol, Ethyl (B): <10 mg/dL (ref <10)

## 2022-09-18 LAB — BETA-HYDROXYBUTYRIC ACID: Beta-Hydroxybutyric Acid: 1.26 mmol/L — ABNORMAL HIGH (ref 0.05–0.27)

## 2022-09-18 LAB — MAGNESIUM: Magnesium: 1.3 mg/dL — ABNORMAL LOW (ref 1.7–2.4)

## 2022-09-18 LAB — CBG MONITORING, ED
Glucose-Capillary: 89 mg/dL (ref 70–99)
Glucose-Capillary: 85 mg/dL (ref 70–99)

## 2022-09-18 LAB — LIPASE, BLOOD: Lipase: 19 U/L (ref 11–51)

## 2022-09-18 MED ORDER — ONDANSETRON HCL 4 MG PO TABS: 4.0000 mg | ORAL_TABLET | Freq: Four times a day (QID) | ORAL | Status: DC | PRN

## 2022-09-18 MED ORDER — LACTATED RINGERS IV SOLN: INTRAVENOUS | Status: AC

## 2022-09-18 MED ORDER — ONDANSETRON HCL 4 MG/2ML IJ SOLN: 4.0000 mg | Freq: Four times a day (QID) | INTRAMUSCULAR | Status: DC | PRN

## 2022-09-18 MED ORDER — ONDANSETRON HCL 4 MG/2ML IJ SOLN
4.0000 mg | Freq: Once | INTRAMUSCULAR | Status: AC
  Administered 2022-09-18: 4 mg via INTRAVENOUS
  Filled 2022-09-18: qty 2

## 2022-09-18 MED ORDER — SUCRALFATE 1 GM/10ML PO SUSP
1.0000 g | Freq: Three times a day (TID) | ORAL | Status: DC
  Administered 2022-09-18 – 2022-09-21 (×9): 1 g via ORAL
  Filled 2022-09-18 (×13): qty 10

## 2022-09-18 MED ORDER — NICOTINE 21 MG/24HR TD PT24
21.0000 mg | MEDICATED_PATCH | Freq: Every day | TRANSDERMAL | Status: DC
  Administered 2022-09-18: 21 mg via TRANSDERMAL
  Filled 2022-09-18 (×3): qty 1

## 2022-09-18 MED ORDER — DULOXETINE HCL 30 MG PO CPEP
60.0000 mg | ORAL_CAPSULE | Freq: Every day | ORAL | Status: DC
  Administered 2022-09-18 – 2022-09-20 (×3): 60 mg via ORAL
  Filled 2022-09-18 (×3): qty 2

## 2022-09-18 MED ORDER — ENOXAPARIN SODIUM 40 MG/0.4ML IJ SOSY
40.0000 mg | PREFILLED_SYRINGE | INTRAMUSCULAR | Status: DC
  Administered 2022-09-18 – 2022-09-20 (×3): 40 mg via SUBCUTANEOUS
  Filled 2022-09-18 (×3): qty 0.4

## 2022-09-18 MED ORDER — LACTATED RINGERS IV SOLN: INTRAVENOUS | Status: DC

## 2022-09-18 MED ORDER — CARVEDILOL 12.5 MG PO TABS
12.5000 mg | ORAL_TABLET | Freq: Two times a day (BID) | ORAL | Status: DC
  Administered 2022-09-19 – 2022-09-21 (×5): 12.5 mg via ORAL
  Filled 2022-09-18 (×5): qty 1

## 2022-09-18 MED ORDER — MORPHINE SULFATE (PF) 4 MG/ML IV SOLN
4.0000 mg | Freq: Once | INTRAVENOUS | Status: AC
  Administered 2022-09-18: 4 mg via INTRAVENOUS
  Filled 2022-09-18: qty 1

## 2022-09-18 MED ORDER — AMIODARONE HCL 200 MG PO TABS
200.0000 mg | ORAL_TABLET | Freq: Every day | ORAL | Status: DC
  Administered 2022-09-19 – 2022-09-21 (×3): 200 mg via ORAL
  Filled 2022-09-18 (×3): qty 1

## 2022-09-18 MED ORDER — LACTATED RINGERS IV BOLUS
1000.0000 mL | Freq: Once | INTRAVENOUS | Status: AC
  Administered 2022-09-18: 1000 mL via INTRAVENOUS

## 2022-09-18 MED ORDER — PANTOPRAZOLE SODIUM 40 MG IV SOLR
40.0000 mg | Freq: Two times a day (BID) | INTRAVENOUS | Status: DC
  Administered 2022-09-19 – 2022-09-21 (×5): 40 mg via INTRAVENOUS
  Filled 2022-09-18 (×5): qty 10

## 2022-09-18 MED ORDER — ALUM & MAG HYDROXIDE-SIMETH 200-200-20 MG/5ML PO SUSP
30.0000 mL | Freq: Once | ORAL | Status: AC
  Administered 2022-09-18: 30 mL via ORAL
  Filled 2022-09-18: qty 30

## 2022-09-18 MED ORDER — POTASSIUM CHLORIDE 10 MEQ/100ML IV SOLN
10.0000 meq | INTRAVENOUS | Status: AC
  Administered 2022-09-18 – 2022-09-19 (×4): 10 meq via INTRAVENOUS
  Filled 2022-09-18 (×3): qty 100

## 2022-09-18 MED ORDER — MORPHINE SULFATE (PF) 2 MG/ML IV SOLN
2.0000 mg | INTRAVENOUS | Status: AC | PRN
  Administered 2022-09-18 – 2022-09-19 (×5): 2 mg via INTRAVENOUS
  Filled 2022-09-18 (×5): qty 1

## 2022-09-18 MED ORDER — PANTOPRAZOLE SODIUM 40 MG IV SOLR
40.0000 mg | Freq: Once | INTRAVENOUS | Status: AC
  Administered 2022-09-18: 40 mg via INTRAVENOUS
  Filled 2022-09-18: qty 10

## 2022-09-18 NOTE — ED Provider Notes (Signed)
[facility] AT [facility] Provider Note   CSN: [id number] Arrival date & time: 09/18/22  1437     History  Chief Complaint  Patient presents with   Chest Pain   Hypoglycemia    [name] is a 52 y.o. male.  52 year old male with history of recurrent alcohol induced pancreatitis, paroxysmal atrial fibrillation, WPW status post ablation, bipolar disorder, polysubstance use presenting for abdominal pain, vomiting, diarrhea.  Patient states yesterday he was at a family reunion had a few beers.  Last night he developed abdominal pain, nonbloody none bilious vomiting and nonbloody diarrhea.  No melena.  He has some mild chest pressure as well.  Pain is very similar to prior episodes of pancreatitis.  No pleuritic pain or shortness of breath.  With EMS he was hypoglycemic and received oral glucose with improvement.   Chest Pain Associated symptoms: abdominal pain, nausea and vomiting   Hypoglycemia Associated symptoms: vomiting        Home Medications Prior to Admission medications   Medication Sig Start Date End Date Taking? Authorizing Provider  acetaminophen (TYLENOL) 650 MG CR tablet Take 1 tablet (650 mg total) by mouth every 8 (eight) hours as needed for pain. 07/23/22   Ghimire, Werner Lean, MD  albuterol (VENTOLIN HFA) 108 (90 Base) MCG/ACT inhaler Inhale 2 puffs into the lungs every 4 (four) hours as needed for wheezing or shortness of breath. 07/23/22   Ghimire, Werner Lean, MD  amiodarone (PACERONE) 200 MG tablet 200 mg twice daily x 1 week, then switch to 200 mg daily 07/23/22   Ghimire, Werner Lean, MD  carvedilol (COREG) 12.5 MG tablet Take 1 tablet (12.5 mg total) by mouth 2 (two) times daily with a meal. 07/23/22   Ghimire, Werner Lean, MD  DULoxetine (CYMBALTA) 30 MG capsule Take 2 capsules (60 mg total) by mouth at bedtime. 07/23/22   Ghimire, Werner Lean, MD  folic acid (FOLVITE) 1 MG tablet Take 1 tablet (1 mg total) by mouth daily.  07/23/22   Ghimire, Werner Lean, MD  HYDROcodone-acetaminophen (NORCO/VICODIN) 5-325 MG tablet Take 1 tablet by mouth every 6 (six) hours as needed. 09/01/22   Charlynne Pander, MD  lidocaine (XYLOCAINE) 2 % solution Use as directed 15 mLs in the mouth or throat every 6 (six) hours as needed for mouth pain. 07/23/22   Ghimire, Werner Lean, MD  lipase/protease/amylase (CREON) 36000 UNITS CPEP capsule Take 2 capsules (72,000 Units total) by mouth 3 (three) times daily with meals. May also take 1 capsule (36,000 Units total) as needed (with snacks). 09/14/22   [name], Willaim Rayas, MD  loperamide (IMODIUM) 2 MG capsule Take 1 capsule (2 mg total) by mouth 4 (four) times daily as needed for diarrhea or loose stools. 07/23/22   Ghimire, Werner Lean, MD  magnesium oxide (MAG-OX) 400 (240 Mg) MG tablet Take 1 tablet (400 mg total) by mouth daily. 07/23/22   Ghimire, Werner Lean, MD  Multiple Vitamin (MULTIVITAMIN WITH MINERALS) TABS tablet Take 1 tablet by mouth daily. 07/23/22   Ghimire, Werner Lean, MD  nicotine (NICODERM CQ - DOSED IN MG/24 HOURS) 21 mg/24hr patch Place 1 patch (21 mg total) onto the skin at bedtime. 07/23/22   Ghimire, Werner Lean, MD  ondansetron (ZOFRAN-ODT) 4 MG disintegrating tablet Take 1 tablet (4 mg total) by mouth every 8 (eight) hours as needed for nausea or vomiting. 09/14/22   [name], Willaim Rayas, MD  pantoprazole (PROTONIX) 40 MG tablet Take 1 tablet (40  mg total) by mouth 2 (two) times daily. 07/23/22   Ghimire, Werner Lean, MD  sucralfate (CARAFATE) 1 GM/10ML suspension Take 10 mLs (1 g total) by mouth 4 (four) times daily -  with meals and at bedtime. 09/14/22   [name], Willaim Rayas, MD  thiamine (VITAMIN B1) 100 MG tablet Take 1 tablet (100 mg total) by mouth daily. 07/23/22   Ghimire, Werner Lean, MD  Vitamin D, Ergocalciferol, (DRISDOL) 1.25 MG (50000 UNIT) CAPS capsule Take 1 capsule (50,000 Units total) by mouth every Wednesday. 07/27/22   Ghimire, Werner Lean, MD  amitriptyline (ELAVIL) 25 MG tablet  Take 1 tablet (25 mg total) by mouth at bedtime. Patient not taking: Reported on 08/08/2019 10/15/18 08/08/19  Rolly Salter, MD      Allergies    Robaxin [methocarbamol], Aspirin, Shellfish-derived products, Trazodone and nefazodone, Adhesive [tape], Latex, Toradol [ketorolac tromethamine], Contrast media [iodinated contrast media], Reglan [metoclopramide], and Salmon [fish oil]    Review of Systems   Review of Systems  Cardiovascular:  Positive for chest pain.  Gastrointestinal:  Positive for abdominal pain, diarrhea, nausea and vomiting.  All other systems reviewed and are negative.   Physical Exam Updated Vital Signs BP (!) 138/97 (BP Location: Right Arm)   Pulse 78   Temp 98 F (36.7 C) (Oral)   Resp 18   SpO2 100%  Physical Exam Vitals and nursing note reviewed.  Constitutional:      General: He is not in acute distress.    Appearance: He is well-developed.  HENT:     Head: Normocephalic and atraumatic.  Eyes:     Conjunctiva/sclera: Conjunctivae normal.  Cardiovascular:     Rate and Rhythm: Normal rate and regular rhythm.     Pulses:          Radial pulses are 2+ on the right side and 2+ on the left side.       Dorsalis pedis pulses are 2+ on the left side.     Heart sounds: No murmur heard. Pulmonary:     Effort: Pulmonary effort is normal. No respiratory distress.     Breath sounds: Normal breath sounds.  Chest:     Chest wall: Tenderness present.  Abdominal:     Palpations: Abdomen is soft.     Tenderness: There is generalized abdominal tenderness. There is no guarding or rebound.  Musculoskeletal:        General: No swelling.     Cervical back: Neck supple.     Right lower leg: No edema.     Left lower leg: No edema.  Skin:    General: Skin is warm and dry.     Capillary Refill: Capillary refill takes less than 2 seconds.  Neurological:     General: No focal deficit present.     Mental Status: He is alert and oriented to person, place, and time.   Psychiatric:        Mood and Affect: Mood normal.     ED Results / Procedures / Treatments   Labs (all labs ordered are listed, but only abnormal results are displayed) Labs Reviewed  COMPREHENSIVE METABOLIC PANEL - Abnormal; Notable for the following components:      Result Value   Sodium 131 (*)    Potassium 3.4 (*)    CO2 11 (*)    Glucose, Bld 104 (*)    Creatinine, Ser 1.43 (*)    Calcium 8.7 (*)    AST 87 (*)    GFR,  Estimated 59 (*)    Anion gap 18 (*)    All other components within normal limits  CBC WITH DIFFERENTIAL/PLATELET - Abnormal; Notable for the following components:   RDW 15.6 (*)    Monocytes Absolute 1.1 (*)    All other components within normal limits  LACTIC ACID, PLASMA - Abnormal; Notable for the following components:   Lactic Acid, Venous 2.1 (*)    All other components within normal limits  BETA-HYDROXYBUTYRIC ACID - Abnormal; Notable for the following components:   Beta-Hydroxybutyric Acid 1.26 (*)    All other components within normal limits  MAGNESIUM - Abnormal; Notable for the following components:   Magnesium 1.3 (*)    All other components within normal limits  I-STAT VENOUS BLOOD GAS, ED - Abnormal; Notable for the following components:   pCO2, Ven 29.9 (*)    pO2, Ven 27 (*)    Bicarbonate 13.9 (*)    TCO2 15 (*)    Acid-base deficit 11.0 (*)    Sodium 134 (*)    Calcium, Ion 1.11 (*)    All other components within normal limits  LIPASE, BLOOD  ETHANOL  URINALYSIS, ROUTINE W REFLEX MICROSCOPIC  RAPID URINE DRUG SCREEN, HOSP PERFORMED  CBC  COMPREHENSIVE METABOLIC PANEL  CBG MONITORING, ED  CBG MONITORING, ED    EKG EKG Interpretation  Date/Time:  Sunday Sep 18 2022 14:43:36 EDT Ventricular Rate:  83 PR Interval:  115 QRS Duration: 81 QT Interval:  390 QTC Calculation: 459 R Axis:   70 Text Interpretation: Sinus rhythm Borderline short PR interval Biatrial enlargement Abnrm T, consider ischemia, anterolateral lds when  comapred to prior, similar appearance. No STEMI Confirmed by Theda Belfast ([id number]) on 09/18/2022 3:09:17 PM  Radiology CT ABDOMEN PELVIS WO CONTRAST  Result Date: 09/18/2022 CLINICAL DATA:  Acute abdominal pain, history of pancreatitis EXAM: CT ABDOMEN AND PELVIS WITHOUT CONTRAST TECHNIQUE: Multidetector CT imaging of the abdomen and pelvis was performed following the standard protocol without IV contrast. RADIATION DOSE REDUCTION: This exam was performed according to the departmental dose-optimization program which includes automated exposure control, adjustment of the mA and/or kV according to patient size and/or use of iterative reconstruction technique. COMPARISON:  09/01/2022 FINDINGS: Lower chest: No acute abnormality. Hepatobiliary: No focal liver abnormality is seen. No gallstones, gallbladder wall thickening, or biliary dilatation. Pancreas: Scattered pancreatic calcifications are noted consistent with chronic pancreatitis. Cystic lesion is again noted in the body of the pancreas measuring 2.6 x 1.7 cm. Given some variation in the technical imaging factors S is likely stable in appearance. Spleen: Normal in size without focal abnormality. Adrenals/Urinary Tract: Adrenal glands are within normal limits. Kidneys demonstrate a normal appearance. No renal calculi or obstructive changes are seen. Bladder is partially distended. Stomach/Bowel: Appendix is well visualized and within normal limits. No inflammatory changes to suggest appendicitis are seen. No obstructive or inflammatory changes of the colon are noted. The small bowel is within normal limits. Persistent gastric wall thickening is noted. Additionally there are foci of air which extend to the margin of the gastric wall which may represent gastric ulcerations. No evidence of perforation is seen. Vascular/Lymphatic: Aortic atherosclerosis. No enlarged abdominal or pelvic lymph nodes. Reproductive: Prostate is unremarkable. Other: No abdominal wall  hernia or abnormality. No abdominopelvic ascites. Musculoskeletal: No acute or significant osseous findings. IMPRESSION: Changes of chronic pancreatitis with calcification. Midbody cystic lesion is noted in the pancreas stable in appearance from the prior exam. Nonemergent MRI may be helpful for further evaluation.  Gastric wall thickening with findings suspicious for ulceration. No perforation is noted. No other focal abnormality is seen. Electronically Signed   By: Alcide Clever M.D.   On: 09/18/2022 17:15   DG Chest 2 View  Result Date: 09/18/2022 CLINICAL DATA:  Chest pain, nausea EXAM: CHEST - 2 VIEW COMPARISON:  09/01/2022 FINDINGS: Normal mediastinum and cardiac silhouette. Normal pulmonary vasculature. No evidence of effusion, infiltrate, or pneumothorax. No acute bony abnormality. IMPRESSION: Normal chest radiograph Electronically Signed   By: Genevive Bi M.D.   On: 09/18/2022 15:45    Procedures Procedures    Medications Ordered in ED Medications  lactated ringers infusion ( Intravenous New Bag/Given 09/18/22 2002)  enoxaparin (LOVENOX) injection 40 mg (40 mg Subcutaneous Given 09/18/22 2009)  morphine (PF) 2 MG/ML injection 2 mg (2 mg Intravenous Given 09/18/22 2208)  ondansetron (ZOFRAN) tablet 4 mg (has no administration in time range)    Or  ondansetron (ZOFRAN) injection 4 mg (has no administration in time range)  nicotine (NICODERM CQ - dosed in mg/24 hours) patch 21 mg (21 mg Transdermal Patch Applied 09/18/22 2007)  potassium chloride 10 mEq in 100 mL IVPB (10 mEq Intravenous New Bag/Given 09/18/22 2255)  pantoprazole (PROTONIX) injection 40 mg (has no administration in time range)  amiodarone (PACERONE) tablet 200 mg (has no administration in time range)  carvedilol (COREG) tablet 12.5 mg (has no administration in time range)  DULoxetine (CYMBALTA) DR capsule 60 mg (60 mg Oral Given 09/18/22 2209)  sucralfate (CARAFATE) 1 GM/10ML suspension 1 g (1 g Oral Given 09/18/22 2209)   ondansetron (ZOFRAN) injection 4 mg (4 mg Intravenous Given 09/18/22 1539)  morphine (PF) 4 MG/ML injection 4 mg (4 mg Intravenous Given 09/18/22 1539)  lactated ringers bolus 1,000 mL (0 mLs Intravenous Stopped 09/18/22 1842)  pantoprazole (PROTONIX) injection 40 mg (40 mg Intravenous Given 09/18/22 1539)  morphine (PF) 4 MG/ML injection 4 mg (4 mg Intravenous Given 09/18/22 1839)  alum & mag hydroxide-simeth (MAALOX/MYLANTA) 200-200-20 MG/5ML suspension 30 mL (30 mLs Oral Given 09/18/22 1841)    ED Course/ Medical Decision Making/ A&P Clinical Course as of 09/18/22 2331  Sun Sep 18, 2022  1515 EKG sinus rhythm, short PR with delta wave consistent with WPW, T wave inversions in V1 and V2, no other ST or T wave abnormalities. [JD]  1614 Chest x-ray reviewed, appears hyperinflated but no signs of consolidation, pneumonia, pleural effusion, pulmonary edema.  Cardiac and mediastinal silhouette appears normal. [JD]  1726 Labs reviewed, notable for sodium 131, bicarb of 11, AKI, anion gap metabolic acidosis.  Blood gas shows pH of 7.27 with appropriate respiratory compensation. [JD]    Clinical Course User Index [JD] Fulton Reek, MD                             Medical Decision Making Amount and/or Complexity of Data Reviewed Labs: ordered. Radiology: ordered.  Risk OTC drugs. Prescription drug management. Decision regarding hospitalization.   52 year old male with history of recurrent pancreatitis presenting for abdominal pain, vomiting, diarrhea, chest pain.  Vital signs reviewed.  On exam patient is resting calmly but does have significant abdominal pain and tenderness.  History is concerning for recurrent pancreatitis, also consider gastritis, cholecystitis, appendicitis.  Has history of contrast allergy, will obtain noncontrast CT and lab workup.  EKG without ischemic changes as noted in ED course.  Chest x-ray reviewed, no signs of acute pathology.  Lab work notable for sodium  131,  bicarb 11, AKI, anion gap metabolic acidosis.  He has elevated ketones and mildly elevated lactic acid likely due to dehydration.  Blood gas shows appropriate respiratory compensation.  CT scan reviewed, he has sequela of chronic pancreatitis her lipase is normal however CT and presentation are consistent with recurrent pancreatitis.  Also has some findings of gastritis.  He was given Protonix no signs of GI bleeding.  Given multiple lab abnormalities in setting of acute on chronic pancreatitis, discussed the patient with the hospitalist team and he was admitted for further management.        Final Clinical Impression(s) / ED Diagnoses Final diagnoses:  None    Rx / DC Orders ED Discharge Orders     None         Fulton Reek, MD 09/18/22 2331    Tegeler, Canary Brim, MD 09/19/22 1534

## 2022-09-18 NOTE — H&P (Signed)
History and Physical    [name] AVW:[id number] DOB: 11-06-1970 DOA: 09/18/2022  PCP: Pcp, No  Patient coming from: Home  I have personally briefly reviewed patient's old medical records in [facility]  Chief Complaint: Abdominal pain, nausea, vomiting  HPI: [name] is a 52 y.o. male with medical history significant for PAF not on anticoagulation due to substance use/nonadherence, WPW s/p ablation, alcohol associated chronic pancreatitis, pancreatic cysts, HTN, HLD, bipolar disorder, depression/anxiety, cocaine use who presented to the ED for evaluation of abdominal pain with nausea and vomiting.  Patient states he was on a family reunion yesterday.  He says he was eating fried food and had a couple beers.  Afterwards he developed some loose stools, epigastric pain, nausea and vomiting.  Abdominal pain radiated to his back.  This persisted today and he was unable to hold down solid or liquid foods today.  This felt similar to his prior pancreatitis episodes.  He called EMS.  He says on route to the ED he was found to be hypoglycemic.  He was given oral glucose but immediately threw it up.  Patient does report ongoing tobacco use about 3/4 PPD.  Also reports cocaine use 2 days ago.  He denies any injection drug use.  He says he has cut back on his alcohol use to about 3 to times a week.  Last alcohol use prior to yesterday was this past Wednesday.  He says he was a significant daily drinker in the past.  ED Course  Labs/Imaging on admission: I have personally reviewed following labs and imaging studies.  Initial vital showed BP 113/90, pulse 78, RR 18, temp 97.8 F, SpO2 100% on room air.  Labs show WBC 9.4, hemoglobin 13.5, platelets 243,000, lipase 19, sodium 131, potassium 3.4, bicarb 11, BUN 15, creatinine 1.43, serum glucose 104, AST 87, ALT 41, alk phos 80, total bilirubin 0.7, beta hydroxybutyrate 1.26, lactic acid 2.1.  2 view chest x-ray negative for  focal consolidation, edema, effusion.  CT abdomen/pelvis without contrast shows chronic pancreatitis with calcification.  Mid body cystic lesion is stable from prior exam.  Gastric wall thickening with findings suspicious for ulceration seen without perforation.  No other focal abnormality.  Patient was given 1 L LR, IV Protonix 40 mg, IV morphine 4 mg x 2, IV Zofran.  The hospitalist service was consulted to admit for further evaluation and management.  Review of Systems: All systems reviewed and are negative except as documented in history of present illness above.   Past Medical History:  Diagnosis Date   Alcoholism /alcohol abuse    Anemia    Anxiety    Arthritis    knees; arms; elbows (03/26/2015)   Asthma    Bipolar disorder (HCC)    Chronic bronchitis (HCC)    Chronic lower back pain    Chronic pancreatitis (HCC)    Cocaine abuse (HCC)    Depression    Family history of adverse reaction to anesthesia    Femoral condyle fracture (HCC) 03/08/2014   left medial/notes 03/09/2014   GERD (gastroesophageal reflux disease)    H/O hiatal hernia    H/O suicide attempt 10/2012   High cholesterol    History of blood transfusion 10/2012   when I tried to commit suicide   History of stomach ulcers    Hypertension    Marijuana abuse, continuous    Migraine    a few times/year (03/26/2015)   Pneumonia 1990's X 3  PTSD (post-traumatic stress disorder)    Sickle cell trait (HCC)    WPW (Wolff-Parkinson-White syndrome)    [name] 03/06/2013    Past Surgical History:  Procedure Laterality Date   BIOPSY  11/25/2017   Procedure: BIOPSY;  Surgeon: [name] Modena, MD;  Location: [facility] ENDOSCOPY;  Service: Endoscopy;;   BIOPSY  10/14/2018   Procedure: BIOPSY;  Surgeon: [name] Modena, MD;  Location: [facility] ENDOSCOPY;  Service: Endoscopy;;   CARDIAC CATHETERIZATION     CYST ENTEROSTOMY  01/02/2020   Procedure: CYST ASPIRATION;  Surgeon: Rachael Fee, MD;  Location: [facility] ENDOSCOPY;  Service:  Endoscopy;;   ESOPHAGOGASTRODUODENOSCOPY (EGD) WITH PROPOFOL N/A 11/25/2017   Procedure: ESOPHAGOGASTRODUODENOSCOPY (EGD) WITH PROPOFOL;  Surgeon: [name] Modena, MD;  Location: [facility] ENDOSCOPY;  Service: Endoscopy;  Laterality: N/A;   ESOPHAGOGASTRODUODENOSCOPY (EGD) WITH PROPOFOL Left 10/14/2018   Procedure: ESOPHAGOGASTRODUODENOSCOPY (EGD) WITH PROPOFOL;  Surgeon: [name] Modena, MD;  Location: [facility] ENDOSCOPY;  Service: Endoscopy;  Laterality: Left;   ESOPHAGOGASTRODUODENOSCOPY (EGD) WITH PROPOFOL N/A 11/14/2018   Procedure: ESOPHAGOGASTRODUODENOSCOPY (EGD) WITH PROPOFOL;  Surgeon: Carman Ching, MD;  Location: [facility] ENDOSCOPY;  Service: Gastroenterology;  Laterality: N/A;   ESOPHAGOGASTRODUODENOSCOPY (EGD) WITH PROPOFOL N/A 01/02/2020   Procedure: ESOPHAGOGASTRODUODENOSCOPY (EGD) WITH PROPOFOL;  Surgeon: Rachael Fee, MD;  Location: [facility] ENDOSCOPY;  Service: Endoscopy;  Laterality: N/A;   ESOPHAGOGASTRODUODENOSCOPY (EGD) WITH PROPOFOL N/A 10/25/2020   Procedure: ESOPHAGOGASTRODUODENOSCOPY (EGD) WITH PROPOFOL;  Surgeon: Meridee Score Netty Starring., MD;  Location: [facility] ENDOSCOPY;  Service: Gastroenterology;  Laterality: N/A;   EUS N/A 01/02/2020   Procedure: UPPER ENDOSCOPIC ULTRASOUND (EUS) RADIAL;  Surgeon: Rachael Fee, MD;  Location: [facility] ENDOSCOPY;  Service: Endoscopy;  Laterality: N/A;   EYE SURGERY Left 1990's   "result of trauma"    FACIAL FRACTURE SURGERY Left 1990's   "result of trauma"    FLEXIBLE SIGMOIDOSCOPY N/A 10/25/2020   Procedure: FLEXIBLE SIGMOIDOSCOPY;  Surgeon: Lemar Lofty., MD;  Location: [facility] ENDOSCOPY;  Service: Gastroenterology;  Laterality: N/A;   FRACTURE SURGERY     HEMOSTASIS CLIP PLACEMENT  10/25/2020   Procedure: HEMOSTASIS CLIP PLACEMENT;  Surgeon: Lemar Lofty., MD;  Location: [facility] ENDOSCOPY;  Service: Gastroenterology;;   HERNIA REPAIR     LEFT HEART CATHETERIZATION WITH CORONARY ANGIOGRAM Right 03/07/2013   Procedure: LEFT HEART CATHETERIZATION WITH  CORONARY ANGIOGRAM;  Surgeon: Ricki Rodriguez, MD;  Location: [facility] CATH LAB;  Service: Cardiovascular;  Laterality: Right;   UMBILICAL HERNIA REPAIR     UPPER GASTROINTESTINAL ENDOSCOPY      Social History:  reports that he has been smoking cigarettes and e-cigarettes. He has a 36.00 pack-year smoking history. He has never used smokeless tobacco. He reports that he does not currently use alcohol. He reports that he does not currently use drugs after having used the following drugs: Marijuana and Cocaine.  Allergies  Allergen Reactions   Robaxin [Methocarbamol] Other (See Comments)    jumpy limbs   Aspirin Other (See Comments)    Unknown reaction   Shellfish-Derived Products Nausea And Vomiting and Rash   Trazodone And Nefazodone Other (See Comments)    Muscle spasms   Adhesive [Tape] Itching   Latex Itching   Toradol [Ketorolac Tromethamine] Other (See Comments)    Has ulcers; cannot have this   Contrast Media [Iodinated Contrast Media] Hives   Reglan [Metoclopramide] Other (See Comments)    Muscle spasms   Salmon [Fish Oil] Nausea And Vomiting and Rash    Family History  Problem Relation Age of Onset  Hypertension Mother    Cirrhosis Father    Alcoholism Father    Hypertension Father    Melanoma Father    Hypertension Other    Coronary artery disease Other      Prior to Admission medications   Medication Sig Start Date End Date Taking? Authorizing Provider  acetaminophen (TYLENOL) 650 MG CR tablet Take 1 tablet (650 mg total) by mouth every 8 (eight) hours as needed for pain. 07/23/22   Ghimire, Werner Lean, MD  albuterol (VENTOLIN HFA) 108 (90 Base) MCG/ACT inhaler Inhale 2 puffs into the lungs every 4 (four) hours as needed for wheezing or shortness of breath. 07/23/22   Ghimire, Werner Lean, MD  amiodarone (PACERONE) 200 MG tablet 200 mg twice daily x 1 week, then switch to 200 mg daily 07/23/22   Ghimire, Werner Lean, MD  carvedilol (COREG) 12.5 MG tablet Take 1 tablet (12.5 mg  total) by mouth 2 (two) times daily with a meal. 07/23/22   Ghimire, Werner Lean, MD  DULoxetine (CYMBALTA) 30 MG capsule Take 2 capsules (60 mg total) by mouth at bedtime. 07/23/22   Ghimire, Werner Lean, MD  folic acid (FOLVITE) 1 MG tablet Take 1 tablet (1 mg total) by mouth daily. 07/23/22   Ghimire, Werner Lean, MD  HYDROcodone-acetaminophen (NORCO/VICODIN) 5-325 MG tablet Take 1 tablet by mouth every 6 (six) hours as needed. 09/01/22   Charlynne Pander, MD  lidocaine (XYLOCAINE) 2 % solution Use as directed 15 mLs in the mouth or throat every 6 (six) hours as needed for mouth pain. 07/23/22   Ghimire, Werner Lean, MD  lipase/protease/amylase (CREON) 36000 UNITS CPEP capsule Take 2 capsules (72,000 Units total) by mouth 3 (three) times daily with meals. May also take 1 capsule (36,000 Units total) as needed (with snacks). 09/14/22   [name], Willaim Rayas, MD  loperamide (IMODIUM) 2 MG capsule Take 1 capsule (2 mg total) by mouth 4 (four) times daily as needed for diarrhea or loose stools. 07/23/22   Ghimire, Werner Lean, MD  magnesium oxide (MAG-OX) 400 (240 Mg) MG tablet Take 1 tablet (400 mg total) by mouth daily. 07/23/22   Ghimire, Werner Lean, MD  Multiple Vitamin (MULTIVITAMIN WITH MINERALS) TABS tablet Take 1 tablet by mouth daily. 07/23/22   Ghimire, Werner Lean, MD  nicotine (NICODERM CQ - DOSED IN MG/24 HOURS) 21 mg/24hr patch Place 1 patch (21 mg total) onto the skin at bedtime. 07/23/22   Ghimire, Werner Lean, MD  ondansetron (ZOFRAN-ODT) 4 MG disintegrating tablet Take 1 tablet (4 mg total) by mouth every 8 (eight) hours as needed for nausea or vomiting. 09/14/22   [name], Willaim Rayas, MD  pantoprazole (PROTONIX) 40 MG tablet Take 1 tablet (40 mg total) by mouth 2 (two) times daily. 07/23/22   Ghimire, Werner Lean, MD  sucralfate (CARAFATE) 1 GM/10ML suspension Take 10 mLs (1 g total) by mouth 4 (four) times daily -  with meals and at bedtime. 09/14/22   [name], Willaim Rayas, MD  thiamine (VITAMIN B1) 100 MG tablet  Take 1 tablet (100 mg total) by mouth daily. 07/23/22   Ghimire, Werner Lean, MD  Vitamin D, Ergocalciferol, (DRISDOL) 1.25 MG (50000 UNIT) CAPS capsule Take 1 capsule (50,000 Units total) by mouth every Wednesday. 07/27/22   Ghimire, Werner Lean, MD  amitriptyline (ELAVIL) 25 MG tablet Take 1 tablet (25 mg total) by mouth at bedtime. Patient not taking: Reported on 08/08/2019 10/15/18 08/08/19  Rolly Salter, MD    Physical Exam: Vitals:  09/18/22 1457 09/18/22 1600 09/18/22 1900 09/18/22 1929  BP:  (!) 120/101  (!) 129/98  Pulse:  75 72 72  Resp:  14 15 (!) 24  Temp:    98.2 F (36.8 C)  TempSrc:    Oral  SpO2: 100% 99% 100% 100%   Constitutional: Sitting up in bed, NAD, calm, comfortable Eyes: EOMI, lids and conjunctivae normal ENMT: Mucous membranes are moist. Posterior pharynx clear of any exudate or lesions.Normal dentition.  Neck: normal, supple, no masses. Respiratory: clear to auscultation bilaterally, no wheezing, no crackles. Normal respiratory effort. No accessory muscle use.  Cardiovascular: Regular rate and rhythm, no murmurs / rubs / gallops. No extremity edema. 2+ pedal pulses. Abdomen: Generalized tenderness more prominent in the epigastric region, no masses palpated. Musculoskeletal: no clubbing / cyanosis. No joint deformity upper and lower extremities. Good ROM, no contractures. Normal muscle tone.  Skin: no rashes, lesions, ulcers. No induration Neurologic:  Sensation intact. Strength 5/5 in all 4.  Psychiatric: Alert and oriented x 3. Normal mood.   EKG: Personally reviewed. Sinus rhythm, rate 83, biatrial enlargement T wave flattening lead I and inversion aVL.  Similar to prior.  Assessment/Plan Principal Problem:   Acute on chronic pancreatitis (HCC) Active Problems:   AKI (acute kidney injury) (HCC)   Paroxysmal atrial fibrillation (HCC)   Alcohol use disorder, severe, dependence (HCC)   Cocaine use disorder, severe, dependence (HCC)   Gastroesophageal reflux  disease   Hypokalemia   [name] is a 52 y.o. male with medical history significant for PAF not on anticoagulation due to substance use/nonadherence, WPW s/p ablation, alcohol associated chronic pancreatitis, pancreatic cysts, HTN, HLD, bipolar disorder, depression/anxiety, cocaine use who is admitted with acute on chronic pancreatitis.  Assessment and Plan: Acute on chronic alcohol associated pancreatitis: Triggered by alcohol use and dietary indiscretion.  CT A/P showed changes of chronic pancreatitis, stable appearing mid body cystic lesion. -Keep n.p.o. except for sips with meds/ice chips as tolerated -Advance diet as tolerated -Continue IV fluid hydration overnight -IV antiemetics and analgesics as needed -Hold Creon until oral intake increases  Acute kidney injury: Secondary to volume depletion from GI losses.  Continue IV fluid hydration and repeat labs in AM.  Paroxysmal atrial fibrillation: In sinus rhythm with controlled rate.  Not on anticoagulation due to substance use and nonadherence per prior documentation. -Continue amiodarone and carvedilol  Substance use disorder: Reports ongoing tobacco, alcohol, and cocaine use.  No evidence of alcohol withdrawal. He is planning to attend [employer]. -Nicotine patch provided -Consult to TOC -Cessation advised  Hypokalemia: IV supplement ordered.  GERD: CT shows gastric wall thickening and findings suspicious for ulceration.  He has not had any evidence of GI bleed at time of admission. -Continue IV Protonix 40 mg BID.    Chronic pain: Secondary to chronic pancreatitis.  Continue analgesics as above.  Resume home Cymbalta.   DVT prophylaxis: enoxaparin (LOVENOX) injection 40 mg Start: 09/18/22 1930 Code Status: Full code, confirmed with patient on admission Family Communication: Discussed with patient, he has discussed with family Disposition Plan: From home and likely discharge to home pending clinical  progress Consults called: None Severity of Illness: The appropriate patient status for this patient is INPATIENT. Inpatient status is judged to be reasonable and necessary in order to provide the required intensity of service to ensure the patient's safety. The patient's presenting symptoms, physical exam findings, and initial radiographic and laboratory data in the context of their chronic comorbidities is felt to place  them at high risk for further clinical deterioration. Furthermore, it is not anticipated that the patient will be medically stable for discharge from the hospital within 2 midnights of admission.   * I certify that at the point of admission it is my clinical judgment that the patient will require inpatient hospital care spanning beyond 2 midnights from the point of admission due to high intensity of service, high risk for further deterioration and high frequency of surveillance required.Darreld Mclean MD [facility]  If 7PM-7AM, please contact night-coverage [email or web address]  09/18/2022, 7:35 PM

## 2022-09-18 NOTE — Hospital Course (Signed)
[name] is a 52 y.o. male with medical history significant for PAF not on anticoagulation due to substance use/nonadherence, WPW s/p ablation, alcohol associated chronic pancreatitis, pancreatic cysts, HTN, HLD, bipolar disorder, depression/anxiety, cocaine use who is admitted with acute on chronic pancreatitis.

## 2022-09-18 NOTE — ED Notes (Signed)
Patient transported to X-ray 

## 2022-09-18 NOTE — ED Notes (Signed)
Pt received for care at 1910.  Admitting provider at bedside with pt at this time.  Bed in lowest position, wheels locked.  Call bell within reach.

## 2022-09-18 NOTE — ED Notes (Signed)
Patient pacing in the halls hold his IV bag, RN educated him not to do that as he could get air in the line and it could be fatal. RN educated him on staying in the bed while IV fluids are running and after he had pain medication.  Patient stated he understood.

## 2022-09-18 NOTE — ED Triage Notes (Signed)
Pt BIB [facility] from home with c/o left sided chest pain that radiates to his lower back that started about an hour ago. Pt also c/o n/v/d that started last night. Pt reports he has been unable to keep anything down. Hx of pancreatitis and takes med for erectile dysfunction so unable to take aspirin or nitro per EMS. CBG 45, EMS gave 15 g oral glucose, 4 mg zofran PTA.    BP 124/94 HR 80 100% room air

## 2022-09-18 NOTE — ED Notes (Signed)
ED TO INPATIENT HANDOFF REPORT  ED Nurse Name and Phone #: Bethanee Redondo [phone]  S Name/Age/Gender [name] 52 y.o. male Room/Bed: 020C/020C  Code Status   Code Status: Prior  Home/SNF/Other Home Patient oriented to: self, place, time, and situation Is this baseline? Yes   Triage Complete: Triage complete  Chief Complaint Acute on chronic pancreatitis (HCC) [K85.90, K86.1]  Triage Note .  Pt BIB [facility] from home with c/o left sided chest pain that radiates to his lower back that started about an hour ago. Pt also c/o n/v/d that started last night. Pt reports he has been unable to keep anything down. Hx of pancreatitis and takes med for erectile dysfunction so unable to take aspirin or nitro per EMS. CBG 45, EMS gave 15 g oral glucose, 4 mg zofran PTA.    BP 124/94 HR 80 100% room air    Allergies Allergies  Allergen Reactions   Robaxin [Methocarbamol] Other (See Comments)    jumpy limbs   Aspirin Other (See Comments)    Unknown reaction   Shellfish-Derived Products Nausea And Vomiting and Rash   Trazodone And Nefazodone Other (See Comments)    Muscle spasms   Adhesive [Tape] Itching   Latex Itching   Toradol [Ketorolac Tromethamine] Other (See Comments)    Has ulcers; cannot have this   Contrast Media [Iodinated Contrast Media] Hives   Reglan [Metoclopramide] Other (See Comments)    Muscle spasms   Salmon [Fish Oil] Nausea And Vomiting and Rash    Level of Care/Admitting Diagnosis ED Disposition   ED Disposition: Admit Condition: None Comment: Hospital Area: [facility] [[id number]]  Level of Care: Telemetry Medical [104]  May admit patient to [facility] or [facility] if equivalent level of care is available:: No  Covid Evaluation: Asymptomatic - no recent exposure (last 10 days) testing not required  Diagnosis: Acute on chronic pancreatitis [facility]) [[id number]]  Admitting Physician: Charlsie Quest [[id number]]  Attending Physician: Charlsie Quest [[id number]]  Certification:: I certify this patient will need inpatient services for at least 2 midnights  Estimated Length of Stay: 2      B Medical/Surgery History Past Medical History:  Diagnosis Date   Alcoholism /alcohol abuse    Anemia    Anxiety    Arthritis    knees; arms; elbows (03/26/2015)   Asthma    Bipolar disorder (HCC)    Chronic bronchitis (HCC)    Chronic lower back pain    Chronic pancreatitis (HCC)    Cocaine abuse (HCC)    Depression    Family history of adverse reaction to anesthesia    Femoral condyle fracture (HCC) 03/08/2014   left medial/notes 03/09/2014   GERD (gastroesophageal reflux disease)    H/O hiatal hernia    H/O suicide attempt 10/2012   High cholesterol    History of blood transfusion 10/2012   when I tried to commit suicide   History of stomach ulcers    Hypertension    Marijuana abuse, continuous    Migraine    a few times/year (03/26/2015)   Pneumonia 1990's X 3   PTSD (post-traumatic stress disorder)    Sickle cell trait (HCC)    WPW (Wolff-Parkinson-White syndrome)    [name] 03/06/2013   Past Surgical History:  Procedure Laterality Date   BIOPSY  11/25/2017   Procedure: BIOPSY;  Surgeon: [name] Modena, MD;  Location: [facility] ENDOSCOPY;  Service: Endoscopy;;   BIOPSY  10/14/2018   Procedure: BIOPSY;  Surgeon: [name] Modena, MD;  Location: [facility] ENDOSCOPY;  Service: Endoscopy;;   CARDIAC CATHETERIZATION     CYST ENTEROSTOMY  01/02/2020   Procedure: CYST ASPIRATION;  Surgeon: Rachael Fee, MD;  Location: [facility] ENDOSCOPY;  Service: Endoscopy;;   ESOPHAGOGASTRODUODENOSCOPY (EGD) WITH PROPOFOL N/A 11/25/2017   Procedure: ESOPHAGOGASTRODUODENOSCOPY (EGD) WITH PROPOFOL;  Surgeon: [name] Modena, MD;  Location: [facility] ENDOSCOPY;  Service: Endoscopy;  Laterality: N/A;   ESOPHAGOGASTRODUODENOSCOPY (EGD) WITH PROPOFOL Left 10/14/2018   Procedure: ESOPHAGOGASTRODUODENOSCOPY (EGD) WITH PROPOFOL;  Surgeon: [name] Modena, MD;  Location: [facility]  ENDOSCOPY;  Service: Endoscopy;  Laterality: Left;   ESOPHAGOGASTRODUODENOSCOPY (EGD) WITH PROPOFOL N/A 11/14/2018   Procedure: ESOPHAGOGASTRODUODENOSCOPY (EGD) WITH PROPOFOL;  Surgeon: Carman Ching, MD;  Location: [facility] ENDOSCOPY;  Service: Gastroenterology;  Laterality: N/A;   ESOPHAGOGASTRODUODENOSCOPY (EGD) WITH PROPOFOL N/A 01/02/2020   Procedure: ESOPHAGOGASTRODUODENOSCOPY (EGD) WITH PROPOFOL;  Surgeon: Rachael Fee, MD;  Location: [facility] ENDOSCOPY;  Service: Endoscopy;  Laterality: N/A;   ESOPHAGOGASTRODUODENOSCOPY (EGD) WITH PROPOFOL N/A 10/25/2020   Procedure: ESOPHAGOGASTRODUODENOSCOPY (EGD) WITH PROPOFOL;  Surgeon: Meridee Score Netty Starring., MD;  Location: [facility] ENDOSCOPY;  Service: Gastroenterology;  Laterality: N/A;   EUS N/A 01/02/2020   Procedure: UPPER ENDOSCOPIC ULTRASOUND (EUS) RADIAL;  Surgeon: Rachael Fee, MD;  Location: [facility] ENDOSCOPY;  Service: Endoscopy;  Laterality: N/A;   EYE SURGERY Left 1990's   "result of trauma"    FACIAL FRACTURE SURGERY Left 1990's   "result of trauma"    FLEXIBLE SIGMOIDOSCOPY N/A 10/25/2020   Procedure: FLEXIBLE SIGMOIDOSCOPY;  Surgeon: Lemar Lofty., MD;  Location: [facility] ENDOSCOPY;  Service: Gastroenterology;  Laterality: N/A;   FRACTURE SURGERY     HEMOSTASIS CLIP PLACEMENT  10/25/2020   Procedure: HEMOSTASIS CLIP PLACEMENT;  Surgeon: Lemar Lofty., MD;  Location: [facility] ENDOSCOPY;  Service: Gastroenterology;;   HERNIA REPAIR     LEFT HEART CATHETERIZATION WITH CORONARY ANGIOGRAM Right 03/07/2013   Procedure: LEFT HEART CATHETERIZATION WITH CORONARY ANGIOGRAM;  Surgeon: Ricki Rodriguez, MD;  Location: [facility] CATH LAB;  Service: Cardiovascular;  Laterality: Right;   UMBILICAL HERNIA REPAIR     UPPER GASTROINTESTINAL ENDOSCOPY       A IV Location/Drains/Wounds Patient Lines/Drains/Airways Status     Active Line/Drains/Airways     Name Placement date Placement time Site Days   Peripheral IV 09/18/22 22 G Left Wrist 09/18/22  1442  Wrist   less than 1            Intake/Output Last 24 hours No intake or output data in the 24 hours ending 09/18/22 1855  Labs/Imaging Results for orders placed or performed during the hospital encounter of 09/18/22 (from the past 48 hour(s))  CBG monitoring, ED     Status: None   Collection Time: 09/18/22  2:46 PM  Result Value Ref Range   Glucose-Capillary 89 70 - 99 mg/dL    Comment: Glucose reference range applies only to samples taken after fasting for at least 8 hours.  Comprehensive metabolic panel     Status: Abnormal   Collection Time: 09/18/22  3:52 PM  Result Value Ref Range   Sodium 131 (L) 135 - 145 mmol/L   Potassium 3.4 (L) 3.5 - 5.1 mmol/L   Chloride 102 98 - 111 mmol/L   CO2 11 (L) 22 - 32 mmol/L   Glucose, Bld 104 (H) 70 - 99 mg/dL    Comment: Glucose reference range applies only to samples taken after fasting for at least 8 hours.   BUN 15 6 - 20 mg/dL  Creatinine, Ser 1.43 (H) 0.61 - 1.24 mg/dL   Calcium 8.7 (L) 8.9 - 10.3 mg/dL   Total Protein 8.0 6.5 - 8.1 g/dL   Albumin 4.1 3.5 - 5.0 g/dL   AST 87 (H) 15 - 41 U/L   ALT 41 0 - 44 U/L   Alkaline Phosphatase 80 38 - 126 U/L   Total Bilirubin 0.7 0.3 - 1.2 mg/dL   GFR, Estimated 59 (L) >60 mL/min    Comment: (NOTE) Calculated using the CKD-EPI Creatinine Equation (2021)    Anion gap 18 (H) 5 - 15    Comment: Performed at [facility], [address] [place], [place] [address]  Lipase, blood     Status: None   Collection Time: 09/18/22  3:52 PM  Result Value Ref Range   Lipase 19 11 - 51 U/L    Comment: Performed at [facility], [address], [place], [place] [address]  CBC with Diff     Status: Abnormal   Collection Time: 09/18/22  3:52 PM  Result Value Ref Range   WBC 9.4 4.0 - 10.5 K/uL   RBC 4.35 4.22 - 5.81 MIL/uL   Hemoglobin 13.5 13.0 - 17.0 g/dL   HCT 24.4 01.0 - 27.2 %   MCV 94.0 80.0 - 100.0 fL   MCH 31.0 26.0 - 34.0 pg   MCHC 33.0 30.0 - 36.0 g/dL   RDW 53.6 (H) 64.4 -  15.5 %   Platelets 243 150 - 400 K/uL   nRBC 0.0 0.0 - 0.2 %   Neutrophils Relative % 60 %   Neutro Abs 5.6 1.7 - 7.7 K/uL   Lymphocytes Relative 27 %   Lymphs Abs 2.6 0.7 - 4.0 K/uL   Monocytes Relative 12 %   Monocytes Absolute 1.1 (H) 0.1 - 1.0 K/uL   Eosinophils Relative 1 %   Eosinophils Absolute 0.1 0.0 - 0.5 K/uL   Basophils Relative 0 %   Basophils Absolute 0.0 0.0 - 0.1 K/uL   Immature Granulocytes 0 %   Abs Immature Granulocytes 0.04 0.00 - 0.07 K/uL    Comment: Performed at [facility], [address], [place], [place] [address]  Beta-hydroxybutyric acid     Status: Abnormal   Collection Time: 09/18/22  3:58 PM  Result Value Ref Range   Beta-Hydroxybutyric Acid 1.26 (H) 0.05 - 0.27 mmol/L    Comment: Performed at [facility], [address], [place], [place] [address]  Lactic acid, plasma     Status: Abnormal   Collection Time: 09/18/22  5:15 PM  Result Value Ref Range   Lactic Acid, Venous 2.1 (HH) 0.5 - 1.9 mmol/L    Comment: CRITICAL RESULT CALLED TO, READ BACK BY AND VERIFIED WITH K,Telesha Deguzman RN @1806  09/18/22 E,BENTON Performed at [facility], [address], [place], [place] [address]   I-Stat venous blood gas, [facility], [facility], [facility])     Status: Abnormal   Collection Time: 09/18/22  5:18 PM  Result Value Ref Range   pH, Ven 7.276 7.25 - 7.43   pCO2, Ven 29.9 (L) 44 - 60 mmHg   pO2, Ven 27 (LL) 32 - 45 mmHg   Bicarbonate 13.9 (L) 20.0 - 28.0 mmol/L   TCO2 15 (L) 22 - 32 mmol/L   O2 Saturation 42 %   Acid-base deficit 11.0 (H) 0.0 - 2.0 mmol/L   Sodium 134 (L) 135 - 145 mmol/L   Potassium 3.6 3.5 - 5.1  mmol/L   Calcium, Ion 1.11 (L) 1.15 - 1.40 mmol/L   HCT 49.0 39.0 - 52.0 %   Hemoglobin 16.7 13.0 - 17.0 g/dL   Sample type VENOUS    Comment NOTIFIED PHYSICIAN   POC CBG, ED     Status: None   Collection Time: 09/18/22  5:38 PM  Result Value Ref Range   Glucose-Capillary 85 70 - 99 mg/dL    Comment: Glucose reference range applies only to  samples taken after fasting for at least 8 hours.   CT ABDOMEN PELVIS WO CONTRAST  Result Date: 09/18/2022 CLINICAL DATA:  Acute abdominal pain, history of pancreatitis EXAM: CT ABDOMEN AND PELVIS WITHOUT CONTRAST TECHNIQUE: Multidetector CT imaging of the abdomen and pelvis was performed following the standard protocol without IV contrast. RADIATION DOSE REDUCTION: This exam was performed according to the departmental dose-optimization program which includes automated exposure control, adjustment of the mA and/or kV according to patient size and/or use of iterative reconstruction technique. COMPARISON:  09/01/2022 FINDINGS: Lower chest: No acute abnormality. Hepatobiliary: No focal liver abnormality is seen. No gallstones, gallbladder wall thickening, or biliary dilatation. Pancreas: Scattered pancreatic calcifications are noted consistent with chronic pancreatitis. Cystic lesion is again noted in the body of the pancreas measuring 2.6 x 1.7 cm. Given some variation in the technical imaging factors S is likely stable in appearance. Spleen: Normal in size without focal abnormality. Adrenals/Urinary Tract: Adrenal glands are within normal limits. Kidneys demonstrate a normal appearance. No renal calculi or obstructive changes are seen. Bladder is partially distended. Stomach/Bowel: Appendix is well visualized and within normal limits. No inflammatory changes to suggest appendicitis are seen. No obstructive or inflammatory changes of the colon are noted. The small bowel is within normal limits. Persistent gastric wall thickening is noted. Additionally there are foci of air which extend to the margin of the gastric wall which may represent gastric ulcerations. No evidence of perforation is seen. Vascular/Lymphatic: Aortic atherosclerosis. No enlarged abdominal or pelvic lymph nodes. Reproductive: Prostate is unremarkable. Other: No abdominal wall hernia or abnormality. No abdominopelvic ascites. Musculoskeletal: No  acute or significant osseous findings. IMPRESSION: Changes of chronic pancreatitis with calcification. Midbody cystic lesion is noted in the pancreas stable in appearance from the prior exam. Nonemergent MRI may be helpful for further evaluation. Gastric wall thickening with findings suspicious for ulceration. No perforation is noted. No other focal abnormality is seen. Electronically Signed   By: Alcide Clever M.D.   On: 09/18/2022 17:15   DG Chest 2 View  Result Date: 09/18/2022 CLINICAL DATA:  Chest pain, nausea EXAM: CHEST - 2 VIEW COMPARISON:  09/01/2022 FINDINGS: Normal mediastinum and cardiac silhouette. Normal pulmonary vasculature. No evidence of effusion, infiltrate, or pneumothorax. No acute bony abnormality. IMPRESSION: Normal chest radiograph Electronically Signed   By: Genevive Bi M.D.   On: 09/18/2022 15:45    Pending Labs Unresulted Labs (From admission, onward)     Start     Ordered   09/18/22 1854  Magnesium  Add-on,   AD        09/18/22 1854   09/18/22 1854  Ethanol  Add-on,   AD        09/18/22 1854   09/18/22 1854  Rapid urine drug screen (hospital performed)  Once,   R        09/18/22 1854   09/18/22 1517  Urinalysis, Routine w reflex microscopic -Urine, Clean Catch  Once,   URGENT       Question:  Specimen Source  Answer:  Urine, Clean Catch   09/18/22 1518            Vitals/Pain Today's Vitals   09/18/22 1448 09/18/22 1457 09/18/22 1600  BP: (Abnormal) 113/92  (Abnormal) 120/101  Pulse: 79  75  Resp: 18  14  Temp: 97.8 F (36.6 C)    TempSrc: Oral    SpO2: 100% 100% 99%    Isolation Precautions No active isolations  Medications Medications  lactated ringers infusion ( Intravenous New Bag/Given 09/18/22 1842)  ondansetron (ZOFRAN) injection 4 mg (4 mg Intravenous Given 09/18/22 1539)  morphine (PF) 4 MG/ML injection 4 mg (4 mg Intravenous Given 09/18/22 1539)  lactated ringers bolus 1,000 mL (0 mLs Intravenous Stopped 09/18/22 1842)  pantoprazole  (PROTONIX) injection 40 mg (40 mg Intravenous Given 09/18/22 1539)  morphine (PF) 4 MG/ML injection 4 mg (4 mg Intravenous Given 09/18/22 1839)  alum & mag hydroxide-simeth (MAALOX/MYLANTA) 200-200-20 MG/5ML suspension 30 mL (30 mLs Oral Given 09/18/22 1841)    Mobility walks     Focused Assessments Gastrointestinal    R Recommendations: See Admitting Provider Note  Report given to:   Additional Notes:

## 2022-09-18 NOTE — ED Notes (Signed)
EDP notified of critical lab values.   Lactic Acid 2.1

## 2022-09-19 DIAGNOSIS — I480 Paroxysmal atrial fibrillation: Secondary | ICD-10-CM

## 2022-09-19 DIAGNOSIS — N179 Acute kidney failure, unspecified: Secondary | ICD-10-CM

## 2022-09-19 DIAGNOSIS — E876 Hypokalemia: Secondary | ICD-10-CM

## 2022-09-19 DIAGNOSIS — F1420 Cocaine dependence, uncomplicated: Secondary | ICD-10-CM

## 2022-09-19 DIAGNOSIS — F1020 Alcohol dependence, uncomplicated: Secondary | ICD-10-CM

## 2022-09-19 DIAGNOSIS — K219 Gastro-esophageal reflux disease without esophagitis: Secondary | ICD-10-CM

## 2022-09-19 DIAGNOSIS — K859 Acute pancreatitis without necrosis or infection, unspecified: Secondary | ICD-10-CM | POA: Diagnosis not present

## 2022-09-19 LAB — CBC
HCT: 35.4 % — ABNORMAL LOW (ref 39.0–52.0)
Hemoglobin: 11.3 g/dL — ABNORMAL LOW (ref 13.0–17.0)
MCH: 31 pg (ref 26.0–34.0)
MCHC: 31.9 g/dL (ref 30.0–36.0)
MCV: 97 fL (ref 80.0–100.0)
Platelets: 191 10*3/uL (ref 150–400)
RBC: 3.65 MIL/uL — ABNORMAL LOW (ref 4.22–5.81)
RDW: 15.7 % — ABNORMAL HIGH (ref 11.5–15.5)
WBC: 8.3 10*3/uL (ref 4.0–10.5)
nRBC: 0 % (ref 0.0–0.2)

## 2022-09-19 LAB — COMPREHENSIVE METABOLIC PANEL
ALT: 33 U/L (ref 0–44)
AST: 71 U/L — ABNORMAL HIGH (ref 15–41)
Albumin: 3.3 g/dL — ABNORMAL LOW (ref 3.5–5.0)
Alkaline Phosphatase: 64 U/L (ref 38–126)
Anion gap: 14 (ref 5–15)
BUN: 10 mg/dL (ref 6–20)
CO2: 14 mmol/L — ABNORMAL LOW (ref 22–32)
Calcium: 8.3 mg/dL — ABNORMAL LOW (ref 8.9–10.3)
Chloride: 105 mmol/L (ref 98–111)
Creatinine, Ser: 1.04 mg/dL (ref 0.61–1.24)
GFR, Estimated: >60 mL/min (ref >60)
Glucose, Bld: 78 mg/dL (ref 70–99)
Potassium: 3.4 mmol/L — ABNORMAL LOW (ref 3.5–5.1)
Sodium: 133 mmol/L — ABNORMAL LOW (ref 135–145)
Total Bilirubin: 0.4 mg/dL (ref 0.3–1.2)
Total Protein: 6.3 g/dL — ABNORMAL LOW (ref 6.5–8.1)

## 2022-09-19 LAB — MAGNESIUM: Magnesium: 1.4 mg/dL — ABNORMAL LOW (ref 1.7–2.4)

## 2022-09-19 IMAGING — DX DG MANDIBLE 4+V
6 series · 6 of 6 positions shown · non-contrast
Comparison: None.

CLINICAL DATA: Jaw pain.

EXAM:
MANDIBLE - 4+ VIEW

[facial ap townes (1 of 2)]
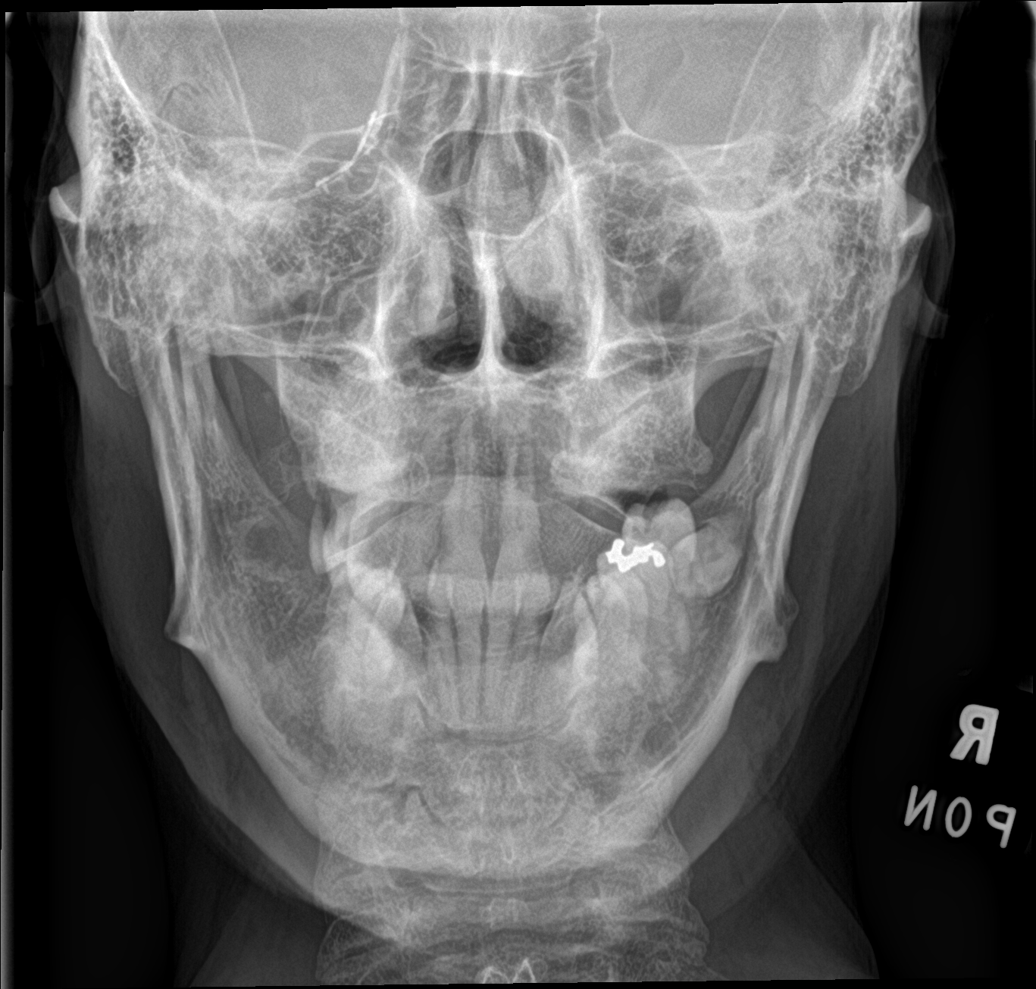

[facial waters]
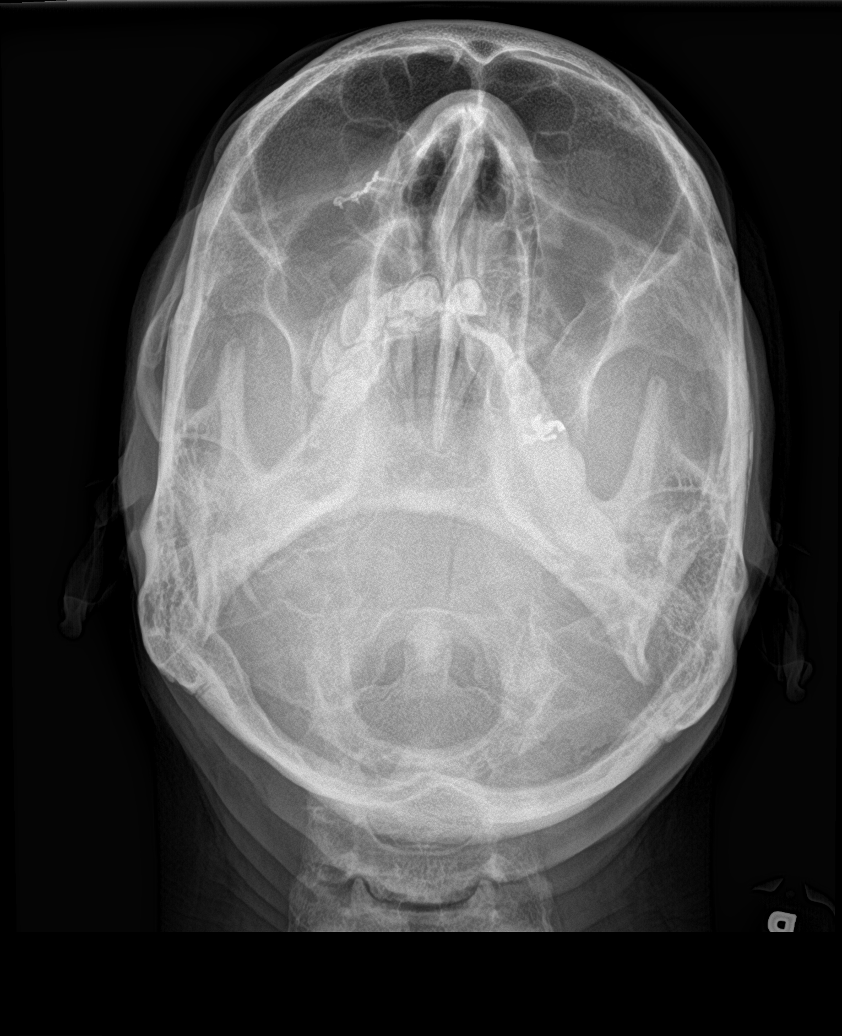

[facial lateral (1 of 3)]
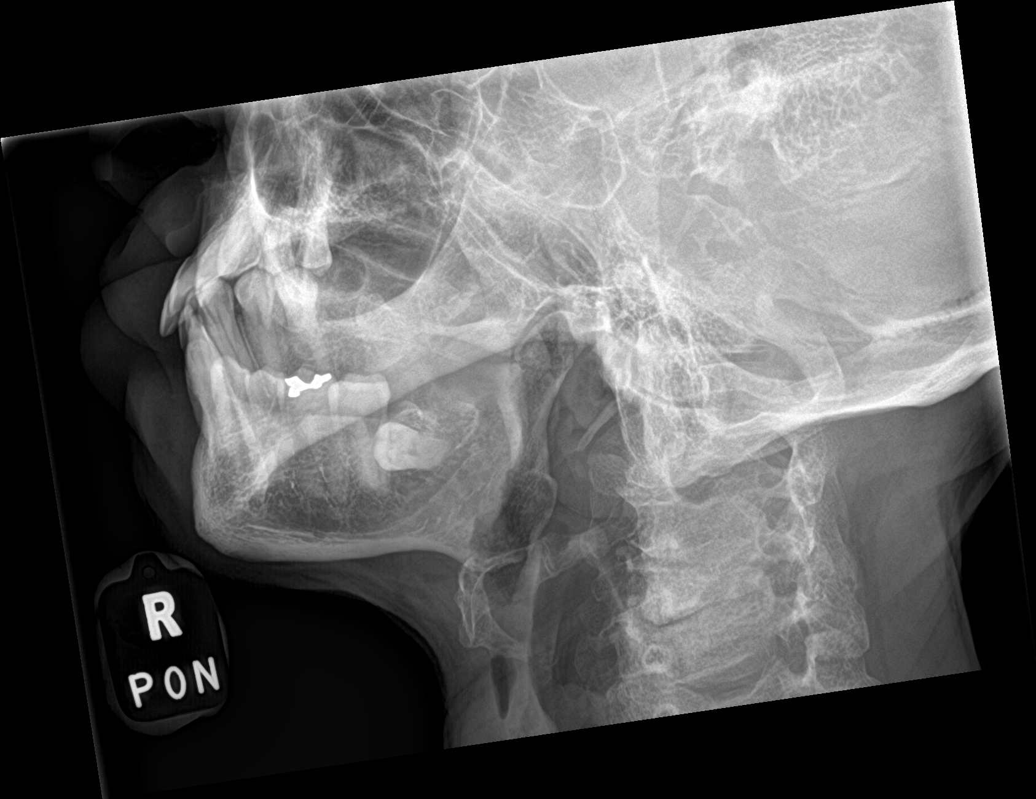

[facial ap townes (2 of 2)]
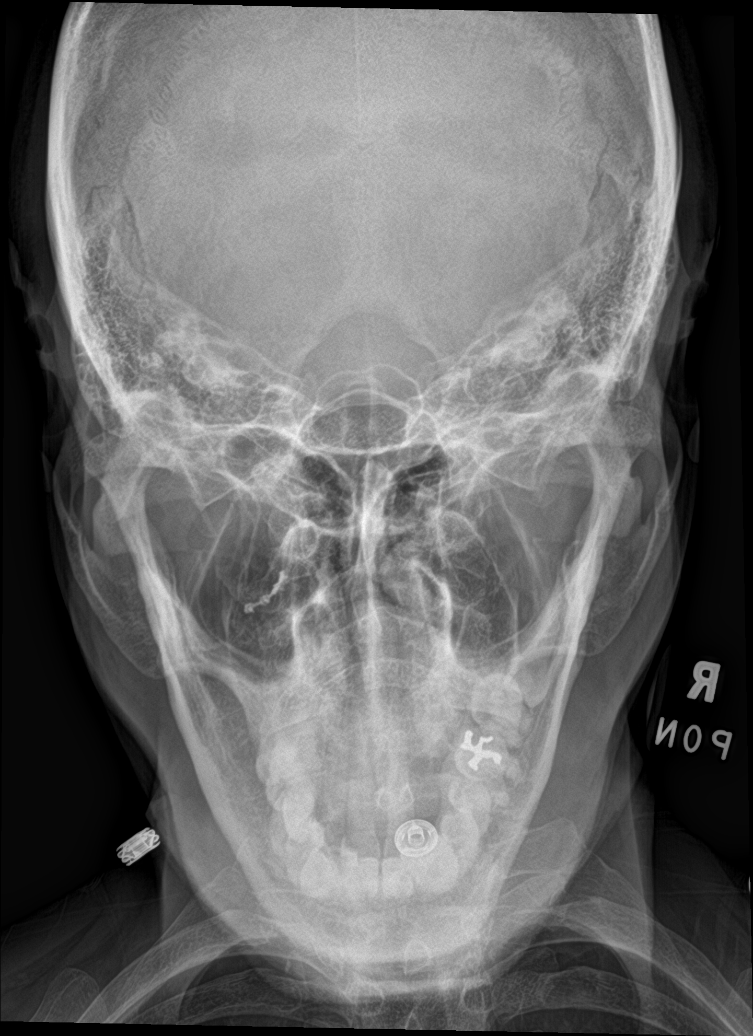

[facial lateral (2 of 3)]
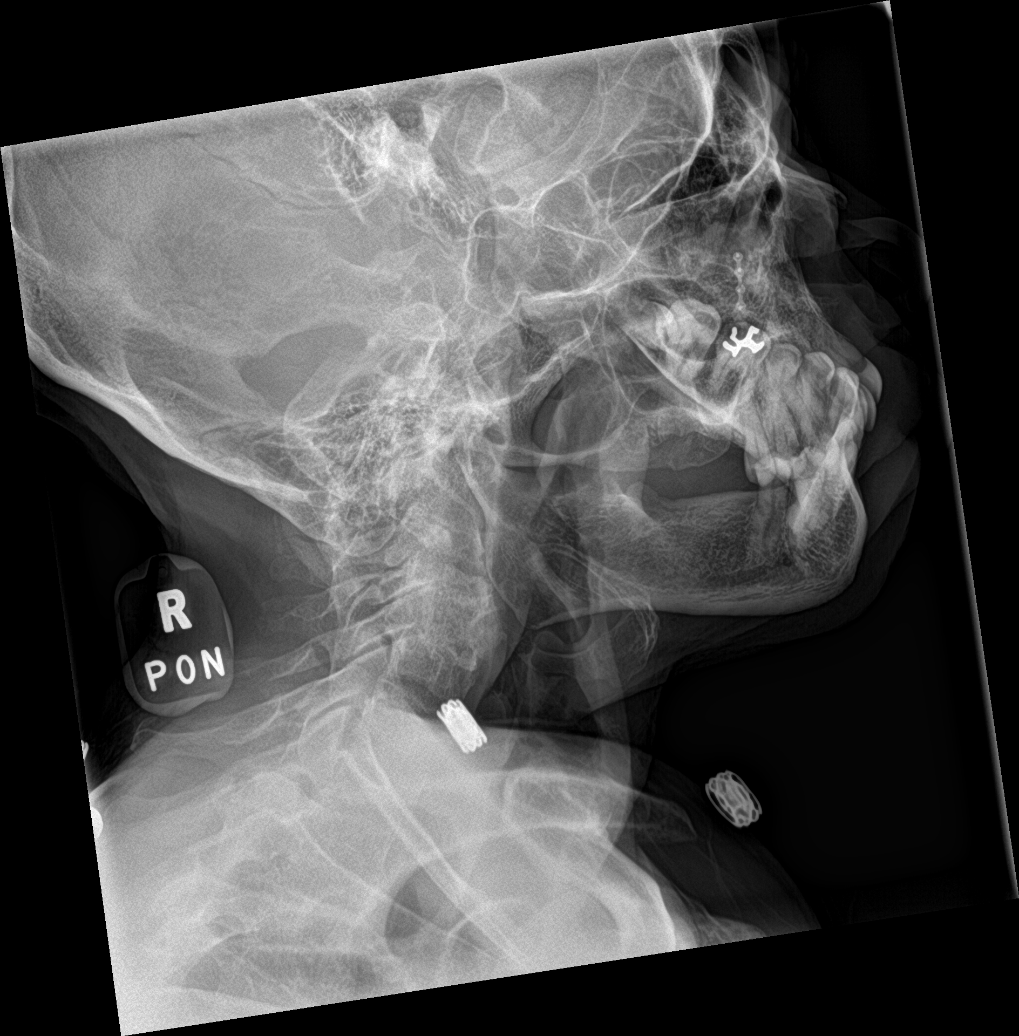

[facial lateral (3 of 3)]
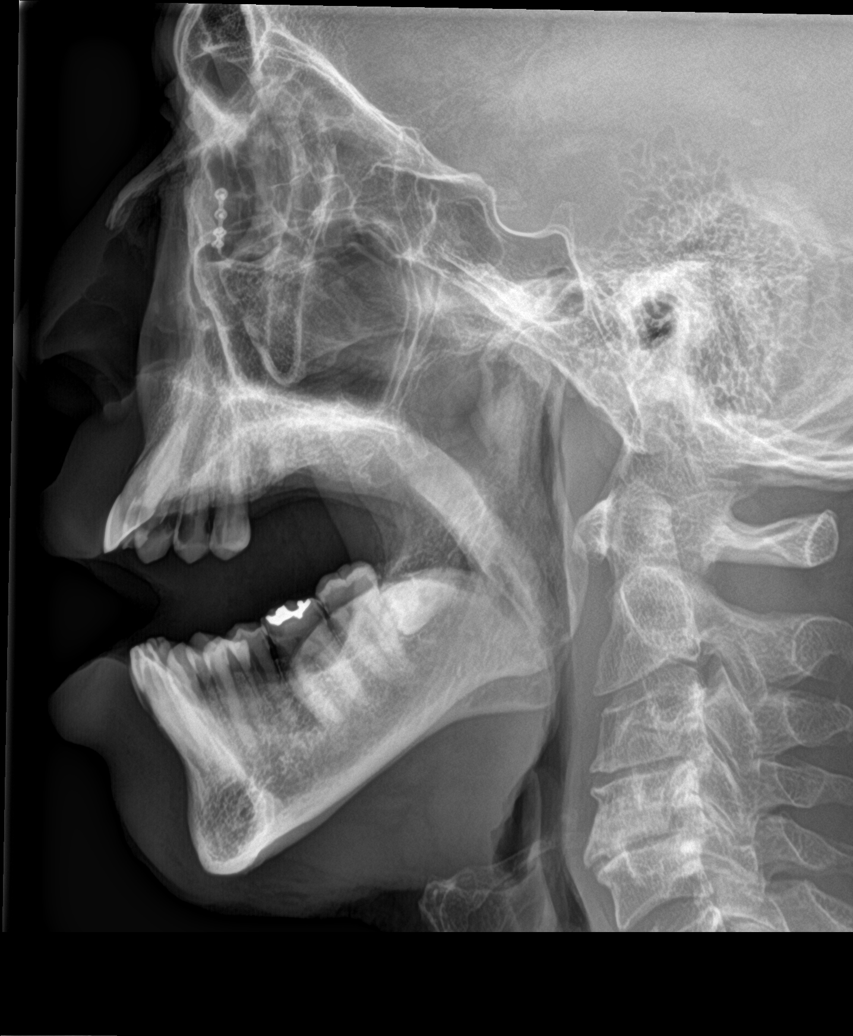

[6 of 6 positions shown; findings below may reference images not displayed]

FINDINGS: There is no evidence of fracture or other focal bone lesions.
IMPRESSION: No fractures identified.  CT imaging would be much more sensitive.

## 2022-09-19 IMAGING — DX DG CHEST 1V
1 series · 1 of 1 positions shown · non-contrast
Comparison: March 10, 2020

CLINICAL DATA: Chest pain

EXAM:
CHEST  1 VIEW

[chest pa]
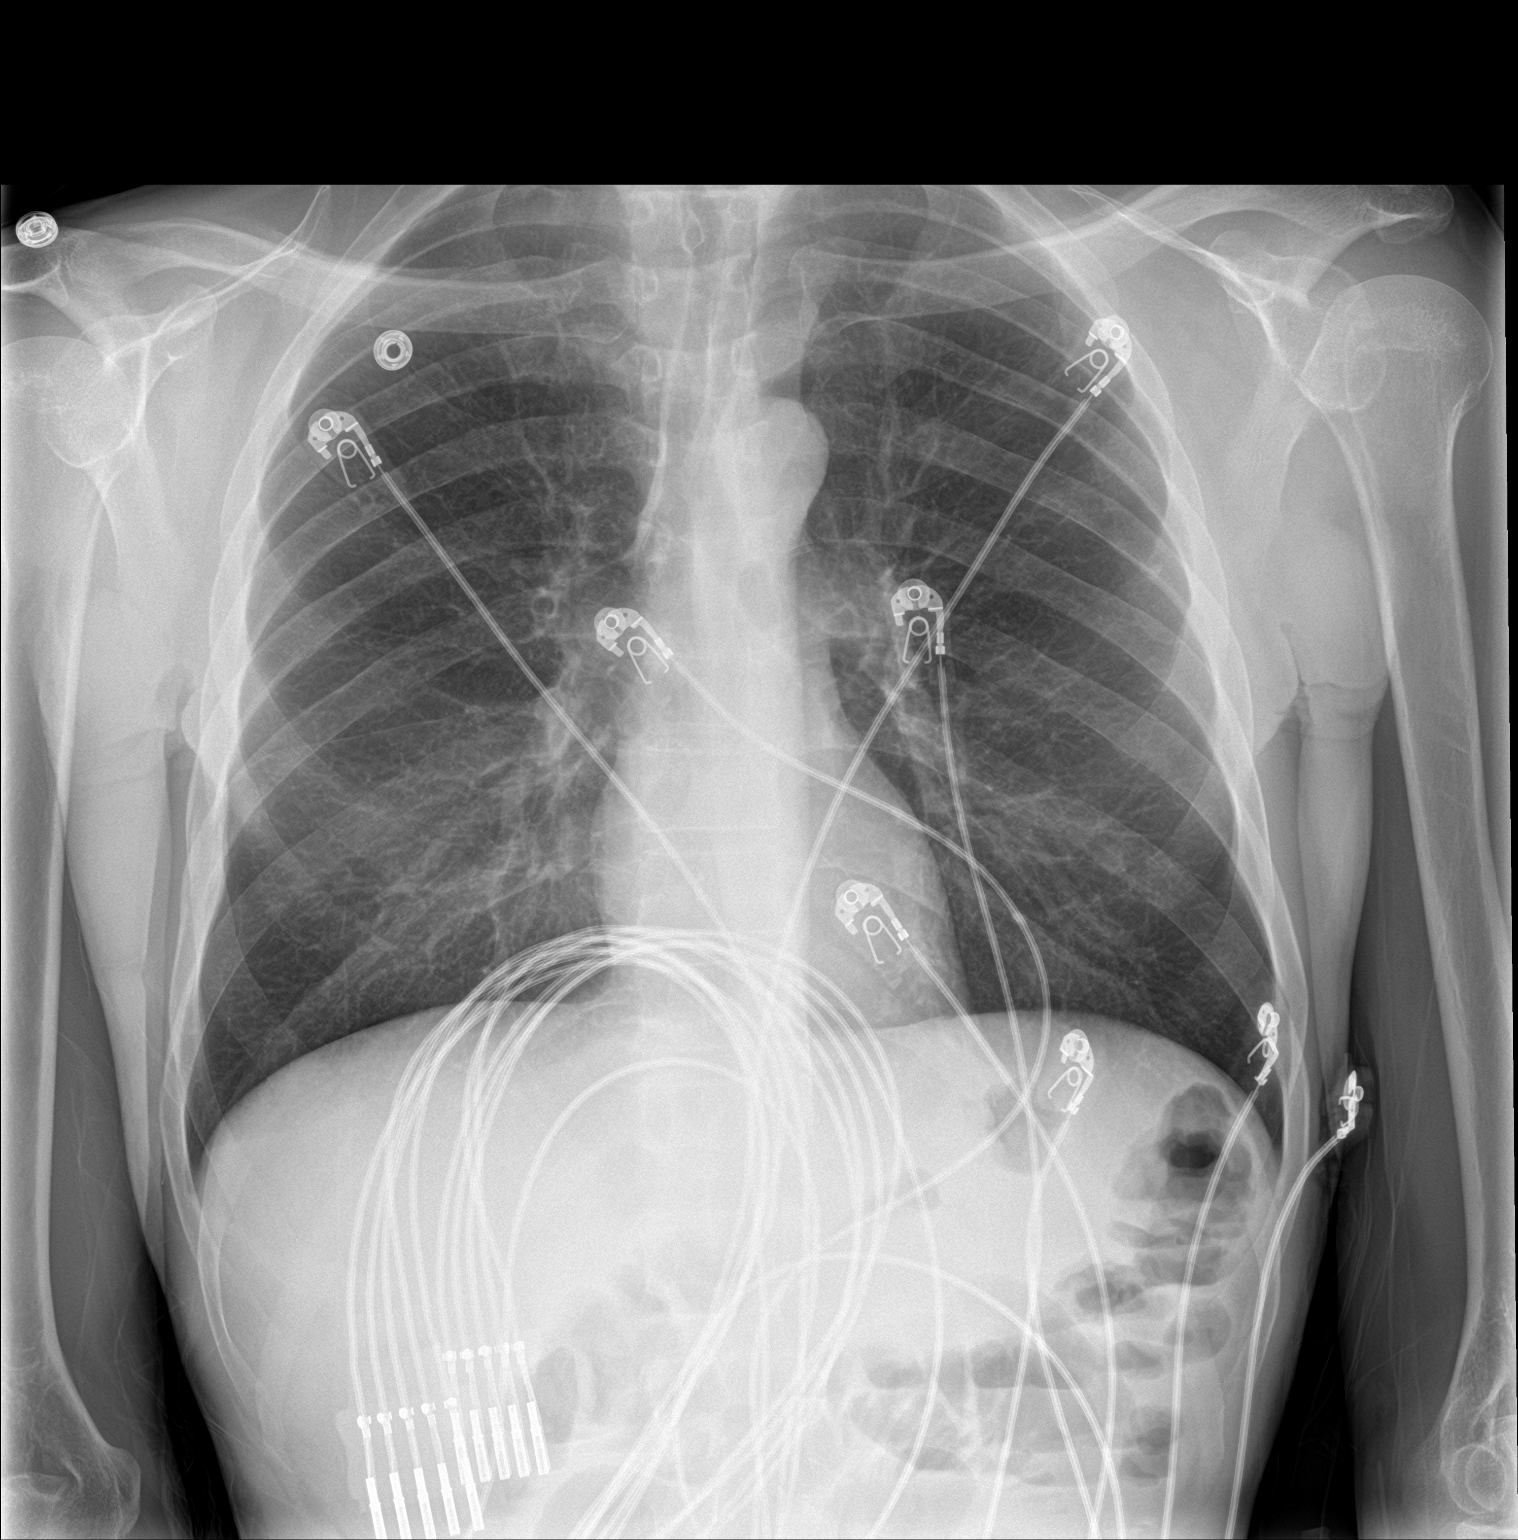

[1 of 1 positions shown; findings below may reference images not displayed]

FINDINGS: The heart size and mediastinal contours are within normal limits.
Both lungs are clear. The visualized skeletal structures are
unremarkable.
IMPRESSION: No active disease.

## 2022-09-19 MED ORDER — MAGNESIUM SULFATE 2 GM/50ML IV SOLN
2.0000 g | Freq: Once | INTRAVENOUS | Status: AC
  Administered 2022-09-19: 2 g via INTRAVENOUS
  Filled 2022-09-19: qty 50

## 2022-09-19 MED ORDER — POTASSIUM CHLORIDE 10 MEQ/100ML IV SOLN
10.0000 meq | INTRAVENOUS | Status: AC
  Administered 2022-09-19 (×4): 10 meq via INTRAVENOUS
  Filled 2022-09-19 (×4): qty 100

## 2022-09-19 MED ORDER — LOPERAMIDE HCL 2 MG PO CAPS
2.0000 mg | ORAL_CAPSULE | Freq: Once | ORAL | Status: AC
  Administered 2022-09-19: 2 mg via ORAL
  Filled 2022-09-19: qty 1

## 2022-09-19 MED ORDER — MORPHINE SULFATE (PF) 2 MG/ML IV SOLN
2.0000 mg | INTRAVENOUS | Status: DC | PRN
  Administered 2022-09-19 – 2022-09-21 (×12): 2 mg via INTRAVENOUS
  Filled 2022-09-19 (×12): qty 1

## 2022-09-19 MED ORDER — ACETAMINOPHEN 325 MG PO TABS
650.0000 mg | ORAL_TABLET | Freq: Once | ORAL | Status: AC
  Administered 2022-09-19: 650 mg via ORAL
  Filled 2022-09-19: qty 2

## 2022-09-19 NOTE — Plan of Care (Signed)

## 2022-09-19 NOTE — Plan of Care (Signed)
  Problem: Education: Goal: Knowledge of General Education information will improve Description Including pain rating scale, medication(s)/side effects and non-pharmacologic comfort measures Outcome: Progressing   Problem: Health Behavior/Discharge Planning: Goal: Ability to manage health-related needs will improve Outcome: Progressing   Problem: Activity: Goal: Risk for activity intolerance will decrease Outcome: Progressing   Problem: Nutrition: Goal: Adequate nutrition will be maintained Outcome: Progressing   Problem: Pain Managment: Goal: General experience of comfort will improve Outcome: Progressing   Problem: Safety: Goal: Ability to remain free from injury will improve Outcome: Progressing   Problem: Skin Integrity: Goal: Risk for impaired skin integrity will decrease Outcome: Progressing   

## 2022-09-19 NOTE — TOC Initial Note (Signed)
Transition of Care [facility]) - Initial/Assessment Note    Patient Details  Name: [name] MRN: [id number] Date of Birth: May 27, 1970  [facility] CM/SW Contact:    Lorri Frederick, LCSW Phone Number: 09/19/2022, 2:23 PM  Clinical Narrative:    CSW met with pt for initial assessment.  Substance use and treatment discussed, see cage aid note.  Pt from home, lives with his mother.  No current services.  TOC will continue to follow for DC needs.                Expected Discharge Plan: Home/Self Care Barriers to Discharge: Continued Medical Work up   Patient Goals and CMS Choice Patient states their goals for this hospitalization and ongoing recovery are:: get away from drugs and drinking          Expected Discharge Plan and Services In-house Referral: Clinical Social Work   Post Acute Care Choice:  (TBD)                                        Prior Living Arrangements/Services   Lives with:: Parents (with mother) Patient language and need for interpreter reviewed:: Yes Do you feel safe going back to the place where you live?: Yes      Need for Family Participation in Patient Care: No (Comment) Care giver support system in place?: Yes (comment) Current home services: Other (comment) (none) Criminal Activity/Legal Involvement Pertinent to Current Situation/Hospitalization: No - Comment as needed  Activities of Daily Living Home Assistive Devices/Equipment: Nebulizer ADL Screening (condition at time of admission) Patient's cognitive ability adequate to safely complete daily activities?: Yes Is the patient deaf or have difficulty hearing?: No Does the patient have difficulty seeing, even when wearing glasses/contacts?: No Does the patient have difficulty concentrating, remembering, or making decisions?: No Patient able to express need for assistance with ADLs?: Yes Does the patient have difficulty dressing or bathing?: No Independently  performs ADLs?: Yes (appropriate for developmental age) Does the patient have difficulty walking or climbing stairs?: Yes Weakness of Legs: None Weakness of Arms/Hands: None  Permission Sought/Granted                  Emotional Assessment Appearance:: Appears older than stated age Attitude/Demeanor/Rapport: Engaged Affect (typically observed): Appropriate, Pleasant Orientation: :  (not recorded, appears oriented) Alcohol / Substance Use: Alcohol Use, Illicit Drugs    Admission diagnosis:  Acute on chronic pancreatitis (HCC) [K85.90, K86.1] Patient Active Problem List   Diagnosis Date Noted   Hypokalemia 09/18/2022   NSVT (nonsustained ventricular tachycardia) (HCC) 01/20/2022   Normal anion gap metabolic acidosis 12/08/2021   Hypoalbuminemia 12/08/2021   Acute recurrent pancreatitis 12/07/2021   WPW (Wolff-Parkinson-White syndrome) 12/06/2021   Paroxysmal atrial fibrillation (HCC) 12/06/2021   Pulmonary nodule 12/06/2021   Leukocytosis 06/07/2021   Epigastric pain 06/07/2021   Acute alcoholic pancreatitis 06/06/2021   Urinary retention    Protein-calorie malnutrition, severe 11/17/2020   Acute pancreatitis 11/15/2020   Hypomagnesemia    Hypocalcemia 10/23/2020   AKI (acute kidney injury) (HCC) 11/13/2018   Seizure (HCC) 11/13/2018   Chest pain 01/08/2018   Acute on chronic pancreatitis (HCC) 09/28/2017   Abdominal pain 05/27/2017   Tachycardia 03/18/2017   Nausea & vomiting 03/18/2017   Diarrhea 03/18/2017   Gastroesophageal reflux disease    Normocytic anemia 12/05/2016   Alcohol use disorder, severe, dependence (HCC)  07/25/2016   Cocaine use disorder, severe, dependence (HCC) 07/25/2016   Major depressive disorder, 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   Chronic pancreatitis (HCC) 05/18/2015   Polysubstance abuse (tobacco, cocaine, THC, and ETOH) 03/26/2015   Essential hypertension 02/06/2014   Mood disorder (HCC) 02/06/2014    Pancreatic pseudocyst/cyst 11/25/2013   Phillips Odor White pattern seen on electrocardiogram 10/03/2012   Tobacco abuse 03/23/2007   PCP:  Oneita Hurt, No Pharmacy:   [facility] [address] [place] [address] Phone: [phone] Fax: [phone]  [id number] - [place], [place] - [address] AT [facility] [address] & [address] [place] [address] Phone: [phone] Fax: [phone]  [facility] [address] [place] [address] Phone: [phone] Fax: [phone]  [id number] [place], [place] - [address] AT [facility] [address] [place] [address] Phone: [phone] Fax: [phone]     Social Determinants of Health (SDOH) Social History: SDOH Screenings   Food Insecurity: No Food Insecurity (09/18/2022)  Housing: Low Risk  (09/18/2022)  Transportation Needs: No Transportation Needs (09/18/2022)  Utilities: Not At Risk (09/18/2022)  Alcohol Screen: Medium Risk (03/14/2017)  Depression (PHQ2-9): Medium Risk (03/27/2020)  Tobacco Use: High Risk (09/18/2022)   SDOH Interventions:     Readmission Risk Interventions    12/16/2021    4:07 PM 08/18/2021   10:38 AM 06/08/2021   11:22 AM  Readmission Risk Prevention Plan  Transportation Screening Complete Complete Complete  Medication Review (RN Care Manager) Referral to Pharmacy Complete Referral to Pharmacy  PCP or Specialist appointment within 3-5 days of discharge Complete Complete Not Complete  PCP/Specialist Appt Not Complete comments   patient not ready to d/c  HRI or Home Care Consult Complete Complete Complete  SW Recovery Care/Counseling Consult Complete Complete Complete  Palliative Care Screening Not Applicable Not Applicable Not Applicable  Skilled Nursing Facility Not Applicable Not Applicable Not Applicable

## 2022-09-19 NOTE — Progress Notes (Signed)
PROGRESS NOTE    [name]  ZOX:[id number] DOB: Jan 14, 1971 DOA: 09/18/2022 PCP: Pcp, No    Brief Narrative:  [name] is a 52 y.o. male with medical history significant for PAF not on anticoagulation due to substance use/nonadherence, WPW s/p ablation, alcohol associated chronic pancreatitis, pancreatic cysts, HTN, HLD, bipolar disorder, depression/anxiety, cocaine use presented to hospital with abdominal pain nausea and vomiting after a family reunion.  Patient did have some fried food and couple of beers after which he started developing loose stools epigastric pain nausea and vomiting.  Patient with history of smoking and use of cocaine recently.  Last drink 1 day prior to presentation.  In the ED, patient was hemodynamically stable.  Labs showed sodium at 131 lipase of 19.  Potassium was slightly low at 3.4 with creatinine of 1.4 AST ALT slightly elevated.  Lactate was elevated at 2.1.  Chest x-ray was negative for infiltrate or effusion.  CT scan of the abdomen and pelvis showed  chronic pancreatitis with calcification.   Patient was then considered for admission to the hospital for further evaluation and treatment.  Assessment/Plan    Acute on chronic alcohol associated pancreatitis: Patient with history of alcohol and dietary indiscretion.  CT A/P showed changes of chronic pancreatitis, stable appearing mid body cystic lesion.  Currently on conservative treatment including IV fluids and p.o. analgesic.  Holding Creon until oral intake improves.  Will start patient on some clears today.  On morphine for severe pain.  Acute kidney injury: Continue IV hydration.  Creatinine of 1.0.  Initial creatinine of 1.4.  Paroxysmal atrial fibrillation: Currently rate controlled.  Not on anticoagulation due to substance abuse and nonadherence as per previous record.  Continue amiodarone and Coreg.   Polysubstance abuse. Reports ongoing tobacco, alcohol, and cocaine use.  No  evidence of alcohol withdrawal. He is planning to attend [facility]..  Continue nicotine patch.  TOC consulted.  Hypokalemia: Mild.  Will replace through IV.  Check BMP in AM.  Hypomagnesemia.  Magnesium today at 1.4.  Will replace through IV.   GERD: CT shows gastric wall thickening and findings suspicious for ulceration.  Continue IV Protonix 40 mg BID.     Chronic pain: Secondary to chronic pancreatitis.  Continue Cymbalta.  Pain management.     DVT prophylaxis: enoxaparin (LOVENOX) injection 40 mg Start: 09/18/22 1930   Code Status:     Code Status: Full Code  Disposition: home  Status is: Inpatient Remains inpatient appropriate because: N.p.o. status, acute on chronic pancreatitis.   Family Communication: none  Consultants:  none  Procedures:  None  Antimicrobials:  None  Anti-infectives (From admission, onward)    None      Subjective: Today, patient was seen and examined at bedside.  Complains of nausea epigastric pain but wishes to try clears.  Has not not had more diarrhea.  Objective: Vitals:   09/18/22 1929 09/18/22 1959 09/19/22 0504 09/19/22 0800  BP: (!) 129/98 (!) 138/97 (!) 138/92 (!) 148/88  Pulse: 72 78 64 68  Resp: (!) 24 18 16 14   Temp: 98.2 F (36.8 C) 98 F (36.7 C) 97.8 F (36.6 C) 98 F (36.7 C)  TempSrc: Oral Oral Oral   SpO2: 100% 100% 100% 100%   No intake or output data in the 24 hours ending 09/19/22 1057 There were no vitals filed for this visit.  Physical Examination: There is no height or weight on file to calculate BMI.  General:  not in obvious  distress, mildly anxious, thinly built, HENT:   No scleral pallor or icterus noted. Oral mucosa is moist.  Chest:  Clear breath sounds.  Diminished breath sounds bilaterally. No crackles or wheezes.  CVS: S1 &S2 heard. No murmur.  Regular rate and rhythm. Abdomen: Soft, nontender,  Bowel sounds are heard.  Left upper quadrant tenderness on palpation. Extremities: No cyanosis,  clubbing or edema.  Peripheral pulses are palpable. Psych: Alert, awake and oriented, normal mood CNS:  No cranial nerve deficits.  Power equal in all extremities.   Skin: Warm and dry.  No rashes noted.  Data Reviewed:   CBC: Recent Labs  Lab 09/18/22 1552 09/18/22 1718 09/19/22 0109  WBC 9.4  --  8.3  NEUTROABS 5.6  --   --   HGB 13.5 16.7 11.3*  HCT 40.9 49.0 35.4*  MCV 94.0  --  97.0  PLT 243  --  191    Basic Metabolic Panel: Recent Labs  Lab 09/18/22 1552 09/18/22 1718 09/18/22 2046 09/19/22 0109  NA 131* 134*  --  133*  K 3.4* 3.6  --  3.4*  CL 102  --   --  105  CO2 11*  --   --  14*  GLUCOSE 104*  --   --  78  BUN 15  --   --  10  CREATININE 1.43*  --   --  1.04  CALCIUM 8.7*  --   --  8.3*  MG  --   --  1.3* 1.4*    Liver Function Tests: Recent Labs  Lab 09/18/22 1552 09/19/22 0109  AST 87* 71*  ALT 41 33  ALKPHOS 80 64  BILITOT 0.7 0.4  PROT 8.0 6.3*  ALBUMIN 4.1 3.3*     Radiology Studies: CT ABDOMEN PELVIS WO CONTRAST  Result Date: 09/18/2022 CLINICAL DATA:  Acute abdominal pain, history of pancreatitis EXAM: CT ABDOMEN AND PELVIS WITHOUT CONTRAST TECHNIQUE: Multidetector CT imaging of the abdomen and pelvis was performed following the standard protocol without IV contrast. RADIATION DOSE REDUCTION: This exam was performed according to the departmental dose-optimization program which includes automated exposure control, adjustment of the mA and/or kV according to patient size and/or use of iterative reconstruction technique. COMPARISON:  09/01/2022 FINDINGS: Lower chest: No acute abnormality. Hepatobiliary: No focal liver abnormality is seen. No gallstones, gallbladder wall thickening, or biliary dilatation. Pancreas: Scattered pancreatic calcifications are noted consistent with chronic pancreatitis. Cystic lesion is again noted in the body of the pancreas measuring 2.6 x 1.7 cm. Given some variation in the technical imaging factors S is likely  stable in appearance. Spleen: Normal in size without focal abnormality. Adrenals/Urinary Tract: Adrenal glands are within normal limits. Kidneys demonstrate a normal appearance. No renal calculi or obstructive changes are seen. Bladder is partially distended. Stomach/Bowel: Appendix is well visualized and within normal limits. No inflammatory changes to suggest appendicitis are seen. No obstructive or inflammatory changes of the colon are noted. The small bowel is within normal limits. Persistent gastric wall thickening is noted. Additionally there are foci of air which extend to the margin of the gastric wall which may represent gastric ulcerations. No evidence of perforation is seen. Vascular/Lymphatic: Aortic atherosclerosis. No enlarged abdominal or pelvic lymph nodes. Reproductive: Prostate is unremarkable. Other: No abdominal wall hernia or abnormality. No abdominopelvic ascites. Musculoskeletal: No acute or significant osseous findings. IMPRESSION: Changes of chronic pancreatitis with calcification. Midbody cystic lesion is noted in the pancreas stable in appearance from the prior exam. Nonemergent MRI  may be helpful for further evaluation. Gastric wall thickening with findings suspicious for ulceration. No perforation is noted. No other focal abnormality is seen. Electronically Signed   By: Alcide Clever M.D.   On: 09/18/2022 17:15   DG Chest 2 View  Result Date: 09/18/2022 CLINICAL DATA:  Chest pain, nausea EXAM: CHEST - 2 VIEW COMPARISON:  09/01/2022 FINDINGS: Normal mediastinum and cardiac silhouette. Normal pulmonary vasculature. No evidence of effusion, infiltrate, or pneumothorax. No acute bony abnormality. IMPRESSION: Normal chest radiograph Electronically Signed   By: Genevive Bi M.D.   On: 09/18/2022 15:45      LOS: 1 day     Joycelyn Das, MD [facility] Available via Epic secure chat 7am-7pm After these hours, please refer to coverage provider listed on  [email or web address] 09/19/2022, 10:57 AM

## 2022-09-19 NOTE — TOC CAGE-AID Note (Signed)
Transition of Care [facility]) - CAGE-AID Screening   Patient Details  Name: [name] MRN: [id number] Date of Birth: [birth year]  [facility]) CM/SW Contact:    Lorri Frederick, LCSW Phone Number: 09/19/2022, 2:20 PM   Clinical Narrative: CSW met with pt to complete cage aid.  Pt reports ETOH use 4x week, usually 6 pack of beer.  Marijuana use 2-3x per week.  Cocaine use 1-2x week, usually $20-30.  Pt does want to pursue residential treatment at [facility], has been there once before many years ago. Pt confirmed he is [place] resident, does have ID at home, but not with him at the hospital. Contact information provided, CSW asked pt to talk to admissions staff at [facility] and CSW will follow up after for any documentation they may need.      CAGE-AID Screening:    Have You Ever Felt You Ought to Cut Down on Your Drinking or Drug Use?: Yes Have People Annoyed You By Critizing Your Drinking Or Drug Use?: Yes Have You Felt Bad Or Guilty About Your Drinking Or Drug Use?: Yes Have You Ever Had a Drink or Used Drugs First Thing In The Morning to Steady Your Nerves or to Get Rid of a Hangover?: Yes CAGE-AID Score: 4     Substance abuse interventions: Other (must comment) (contact list of substance abuse treatment providers)

## 2022-09-20 DIAGNOSIS — F1020 Alcohol dependence, uncomplicated: Secondary | ICD-10-CM | POA: Diagnosis not present

## 2022-09-20 DIAGNOSIS — I480 Paroxysmal atrial fibrillation: Secondary | ICD-10-CM | POA: Diagnosis not present

## 2022-09-20 DIAGNOSIS — N179 Acute kidney failure, unspecified: Secondary | ICD-10-CM | POA: Diagnosis not present

## 2022-09-20 DIAGNOSIS — K859 Acute pancreatitis without necrosis or infection, unspecified: Secondary | ICD-10-CM | POA: Diagnosis not present

## 2022-09-20 MED ORDER — MAGNESIUM OXIDE -MG SUPPLEMENT 400 (240 MG) MG PO TABS: 400.0000 mg | ORAL_TABLET | Freq: Every day | ORAL | Status: DC

## 2022-09-20 MED ORDER — POTASSIUM CHLORIDE CRYS ER 20 MEQ PO TBCR
40.0000 meq | EXTENDED_RELEASE_TABLET | Freq: Once | ORAL | Status: AC
  Administered 2022-09-20: 40 meq via ORAL
  Filled 2022-09-20: qty 2

## 2022-09-20 MED ORDER — OXYCODONE HCL 5 MG PO TABS
5.0000 mg | ORAL_TABLET | ORAL | Status: DC | PRN
  Administered 2022-09-20 – 2022-09-21 (×4): 5 mg via ORAL
  Filled 2022-09-20 (×5): qty 1

## 2022-09-20 MED ORDER — MAGNESIUM SULFATE 2 GM/50ML IV SOLN
2.0000 g | Freq: Once | INTRAVENOUS | Status: AC
  Administered 2022-09-20: 2 g via INTRAVENOUS
  Filled 2022-09-20: qty 50

## 2022-09-20 MED ORDER — ALUM & MAG HYDROXIDE-SIMETH 200-200-20 MG/5ML PO SUSP: 30.0000 mL | Freq: Four times a day (QID) | ORAL | Status: DC | PRN

## 2022-09-20 MED ORDER — PANCRELIPASE (LIP-PROT-AMYL) 36000-114000 UNITS PO CPEP
36000.0000 [IU] | ORAL_CAPSULE | Freq: Three times a day (TID) | ORAL | Status: DC
  Administered 2022-09-20 – 2022-09-21 (×3): 36000 [IU] via ORAL
  Filled 2022-09-20 (×3): qty 1

## 2022-09-20 MED ORDER — LIDOCAINE VISCOUS HCL 2 % MT SOLN
15.0000 mL | Freq: Once | OROMUCOSAL | Status: AC
  Administered 2022-09-20: 15 mL via ORAL
  Filled 2022-09-20: qty 15

## 2022-09-20 NOTE — Plan of Care (Signed)

## 2022-09-20 NOTE — Progress Notes (Signed)
PROGRESS NOTE    [name]  ZOX:[id number] DOB: 1970-11-11 DOA: 09/18/2022 PCP: Pcp, No    Brief Narrative:   [name] is a 52 y.o. male with medical history significant for PAF not on anticoagulation due to substance use/nonadherence, WPW s/p ablation, alcohol associated chronic pancreatitis, pancreatic cysts, HTN, HLD, bipolar disorder, depression/anxiety, cocaine use presented to the hospital with abdominal pain, nausea and vomiting after a family reunion.  Patient did have some fried food and couple of beers after which he started developing loose stools epigastric pain nausea and vomiting.  Patient with history of smoking and use of cocaine recently.  Last drink 1 day prior to presentation.  In the ED, patient was hemodynamically stable.  Labs showed sodium at 131 lipase of 19.  Potassium was slightly low at 3.4 with creatinine of 1.4 AST ALT slightly elevated.  Lactate was elevated at 2.1.  Chest x-ray was negative for infiltrate or effusion.  CT scan of the abdomen and pelvis showed  chronic pancreatitis with calcification.   Patient was then considered for admission to the hospital for further evaluation and treatment.  Assessment/Plan    Acute on chronic alcohol associated pancreatitis: Patient with history of alcohol and dietary indiscretion.  CT A/P showed changes of chronic pancreatitis, stable appearing mid body cystic lesion.  Currently on conservative treatment including IV fluids and analgesics.  Will add oral analgesic.  Will restart Creon, morphine for severe pain.  Will advance to full liquids if tolerated, patient still complains of abdominal pain.  Was able to hold the clears so far.  Acute kidney injury: Received IV hydration.  Creatinine of 1.0.  Initial creatinine of 1.4.  Paroxysmal atrial fibrillation: Currently rate controlled.  Not on anticoagulation due to substance abuse and nonadherence as per previous record.  Continue amiodarone and  Coreg.   Polysubstance abuse. Reports ongoing tobacco, alcohol, and cocaine use.  No evidence of alcohol withdrawal. He is planning to attend [facility]..  Continue nicotine patch.  TOC consulted.  Hypokalemia: Mild.  Was replenished.  Will continue oral replacement.  Check levels in AM.  Hypomagnesemia.  Magnesium on 5/13/20204 at 1.4.  Continue IV magnesium sulfate replacement   GERD: CT shows gastric wall thickening and findings suspicious for ulceration.  Continue IV Protonix 40 mg BID.     Chronic pain: Secondary to chronic pancreatitis.  Continue Cymbalta.  Pain management.     DVT prophylaxis: enoxaparin (LOVENOX) injection 40 mg Start: 09/18/22 1930   Code Status:     Code Status: Full Code  Disposition: home   Status is: Inpatient  Remains inpatient appropriate because: Pending clinical improvement, acute on chronic pancreatitis, poor oral tolerance.    Family Communication: none  Consultants:  none  Procedures:  None  Antimicrobials:  None  Anti-infectives (From admission, onward)    None      Subjective: Today, patient was seen and examined at bedside.  Complains of epigastric pain was able to tolerate clears.  Did not have further diarrhea.  No nausea vomiting fever or chills.    Objective: Vitals:   09/19/22 1450 09/19/22 2036 09/20/22 0407 09/20/22 0759  BP: 125/86 113/76 124/85 127/85  Pulse: 61  71 96  Resp: 16 15 15 17   Temp: 97.6 F (36.4 C) 97.7 F (36.5 C) 98 F (36.7 C)   TempSrc: Oral Oral    SpO2: 100% 100% 100% 100%    Intake/Output Summary (Last 24 hours) at 09/20/2022 1156 Last data filed at  09/20/2022 0900 Gross per 24 hour  Intake 880.03 ml  Output --  Net 880.03 ml   There were no vitals filed for this visit.  Physical Examination: There is no height or weight on file to calculate BMI.   General:  not in obvious distress, anxious, thinly built. HENT:   No scleral pallor or icterus noted. Oral mucosa is moist.   Chest:   Diminished breath sounds bilaterally. No crackles or wheezes.  CVS: S1 &S2 heard. No murmur.  Regular rate and rhythm. Abdomen: Soft, nontender,  Bowel sounds are heard.  Left upper quadrant tenderness on palpation. Extremities: No cyanosis, clubbing or edema.  Peripheral pulses are palpable. Psych: Alert, awake and oriented, normal mood CNS:  No cranial nerve deficits.  Power equal in all extremities.   Skin: Warm and dry.  No rashes noted.  Data Reviewed:   CBC: Recent Labs  Lab 09/18/22 1552 09/18/22 1718 09/19/22 0109  WBC 9.4  --  8.3  NEUTROABS 5.6  --   --   HGB 13.5 16.7 11.3*  HCT 40.9 49.0 35.4*  MCV 94.0  --  97.0  PLT 243  --  191     Basic Metabolic Panel: Recent Labs  Lab 09/18/22 1552 09/18/22 1718 09/18/22 2046 09/19/22 0109  NA 131* 134*  --  133*  K 3.4* 3.6  --  3.4*  CL 102  --   --  105  CO2 11*  --   --  14*  GLUCOSE 104*  --   --  78  BUN 15  --   --  10  CREATININE 1.43*  --   --  1.04  CALCIUM 8.7*  --   --  8.3*  MG  --   --  1.3* 1.4*     Liver Function Tests: Recent Labs  Lab 09/18/22 1552 09/19/22 0109  AST 87* 71*  ALT 41 33  ALKPHOS 80 64  BILITOT 0.7 0.4  PROT 8.0 6.3*  ALBUMIN 4.1 3.3*      Radiology Studies: CT ABDOMEN PELVIS WO CONTRAST  Result Date: 09/18/2022 CLINICAL DATA:  Acute abdominal pain, history of pancreatitis EXAM: CT ABDOMEN AND PELVIS WITHOUT CONTRAST TECHNIQUE: Multidetector CT imaging of the abdomen and pelvis was performed following the standard protocol without IV contrast. RADIATION DOSE REDUCTION: This exam was performed according to the departmental dose-optimization program which includes automated exposure control, adjustment of the mA and/or kV according to patient size and/or use of iterative reconstruction technique. COMPARISON:  09/01/2022 FINDINGS: Lower chest: No acute abnormality. Hepatobiliary: No focal liver abnormality is seen. No gallstones, gallbladder wall thickening, or  biliary dilatation. Pancreas: Scattered pancreatic calcifications are noted consistent with chronic pancreatitis. Cystic lesion is again noted in the body of the pancreas measuring 2.6 x 1.7 cm. Given some variation in the technical imaging factors S is likely stable in appearance. Spleen: Normal in size without focal abnormality. Adrenals/Urinary Tract: Adrenal glands are within normal limits. Kidneys demonstrate a normal appearance. No renal calculi or obstructive changes are seen. Bladder is partially distended. Stomach/Bowel: Appendix is well visualized and within normal limits. No inflammatory changes to suggest appendicitis are seen. No obstructive or inflammatory changes of the colon are noted. The small bowel is within normal limits. Persistent gastric wall thickening is noted. Additionally there are foci of air which extend to the margin of the gastric wall which may represent gastric ulcerations. No evidence of perforation is seen. Vascular/Lymphatic: Aortic atherosclerosis. No enlarged abdominal or pelvic  lymph nodes. Reproductive: Prostate is unremarkable. Other: No abdominal wall hernia or abnormality. No abdominopelvic ascites. Musculoskeletal: No acute or significant osseous findings. IMPRESSION: Changes of chronic pancreatitis with calcification. Midbody cystic lesion is noted in the pancreas stable in appearance from the prior exam. Nonemergent MRI may be helpful for further evaluation. Gastric wall thickening with findings suspicious for ulceration. No perforation is noted. No other focal abnormality is seen. Electronically Signed   By: Alcide Clever M.D.   On: 09/18/2022 17:15   DG Chest 2 View  Result Date: 09/18/2022 CLINICAL DATA:  Chest pain, nausea EXAM: CHEST - 2 VIEW COMPARISON:  09/01/2022 FINDINGS: Normal mediastinum and cardiac silhouette. Normal pulmonary vasculature. No evidence of effusion, infiltrate, or pneumothorax. No acute bony abnormality. IMPRESSION: Normal chest radiograph  Electronically Signed   By: Genevive Bi M.D.   On: 09/18/2022 15:45      LOS: 2 days    Joycelyn Das, MD [facility] Available via Epic secure chat 7am-7pm After these hours, please refer to coverage provider listed on [email or web address] 09/20/2022, 11:56 AM

## 2022-09-20 NOTE — TOC Progression Note (Signed)
Transition of Care [facility]) - Progression Note    Patient Details  Name: [name] MRN: [id number] Date of Birth: [birth year]  Transition [facility] Care [facility]) CM/SW Contact  Lorri Frederick, LCSW Phone Number: 09/20/2022, 2:13 PM  Clinical Narrative:  CSW spoke with pt regarding follow up at [facility].  Pt reports he did speak with [facility] and they told him to call when he is discharged from the hospital.  They are not requesting anything additional from CSW.  Pt aware that bus does not run as far as [facility], says he can get a ride from his father.  Pt also asking for letter that he is currently hospitalized for court custody hearing this week, letter provided.     Expected Discharge Plan: Home/Self Care Barriers to Discharge: Continued Medical Work up  Expected Discharge Plan and Services In-house Referral: Clinical Social Work   Post Acute Care Choice:  (TBD)                                         Social Determinants of Health (SDOH) Interventions SDOH Screenings   Food Insecurity: No Food Insecurity (09/18/2022)  Housing: Low Risk  (09/18/2022)  Transportation Needs: No Transportation Needs (09/18/2022)  Utilities: Not At Risk (09/18/2022)  Alcohol Screen: Medium Risk (03/14/2017)  Depression (PHQ2-9): Medium Risk (03/27/2020)  Tobacco Use: High Risk (09/18/2022)    Readmission Risk Interventions    12/16/2021    4:07 PM 08/18/2021   10:38 AM 06/08/2021   11:22 AM  Readmission Risk Prevention Plan  Transportation Screening Complete Complete Complete  Medication Review (RN Care Manager) Referral to Pharmacy Complete Referral to Pharmacy  PCP or Specialist appointment within 3-5 days of discharge Complete Complete Not Complete  PCP/Specialist Appt Not Complete comments   patient not ready to d/c  HRI or Home Care Consult Complete Complete Complete  SW Recovery Care/Counseling Consult Complete Complete Complete  Palliative Care Screening Not Applicable Not  Applicable Not Applicable  Skilled Nursing Facility Not Applicable Not Applicable Not Applicable

## 2022-09-21 DIAGNOSIS — I480 Paroxysmal atrial fibrillation: Secondary | ICD-10-CM | POA: Diagnosis not present

## 2022-09-21 DIAGNOSIS — F1020 Alcohol dependence, uncomplicated: Secondary | ICD-10-CM | POA: Diagnosis not present

## 2022-09-21 DIAGNOSIS — K859 Acute pancreatitis without necrosis or infection, unspecified: Secondary | ICD-10-CM | POA: Diagnosis not present

## 2022-09-21 DIAGNOSIS — N179 Acute kidney failure, unspecified: Secondary | ICD-10-CM | POA: Diagnosis not present

## 2022-09-21 LAB — BASIC METABOLIC PANEL
Anion gap: 9 (ref 5–15)
BUN: <5 mg/dL — ABNORMAL LOW (ref 6–20)
CO2: 23 mmol/L (ref 22–32)
Calcium: 8.4 mg/dL — ABNORMAL LOW (ref 8.9–10.3)
Chloride: 102 mmol/L (ref 98–111)
Creatinine, Ser: 0.8 mg/dL (ref 0.61–1.24)
GFR, Estimated: >60 mL/min (ref >60)
Glucose, Bld: 130 mg/dL — ABNORMAL HIGH (ref 70–99)
Potassium: 3.9 mmol/L (ref 3.5–5.1)
Sodium: 134 mmol/L — ABNORMAL LOW (ref 135–145)

## 2022-09-21 LAB — CBC
HCT: 30.9 % — ABNORMAL LOW (ref 39.0–52.0)
Hemoglobin: 10 g/dL — ABNORMAL LOW (ref 13.0–17.0)
MCH: 31 pg (ref 26.0–34.0)
MCHC: 32.4 g/dL (ref 30.0–36.0)
MCV: 95.7 fL (ref 80.0–100.0)
Platelets: 166 10*3/uL (ref 150–400)
RBC: 3.23 MIL/uL — ABNORMAL LOW (ref 4.22–5.81)
RDW: 15.5 % (ref 11.5–15.5)
WBC: 7.2 10*3/uL (ref 4.0–10.5)
nRBC: 0 % (ref 0.0–0.2)

## 2022-09-21 LAB — MAGNESIUM: Magnesium: 1.5 mg/dL — ABNORMAL LOW (ref 1.7–2.4)

## 2022-09-21 MED ORDER — ALUM & MAG HYDROXIDE-SIMETH 200-200-20 MG/5ML PO SUSP: 30.0000 mL | Freq: Four times a day (QID) | ORAL | 0 refills | Status: DC | PRN

## 2022-09-21 MED ORDER — CARVEDILOL 12.5 MG PO TABS: 12.5000 mg | ORAL_TABLET | Freq: Two times a day (BID) | ORAL | 1 refills | Status: DC

## 2022-09-21 MED ORDER — ONDANSETRON 4 MG PO TBDP: 4.0000 mg | ORAL_TABLET | Freq: Two times a day (BID) | ORAL | 2 refills | Status: DC | PRN

## 2022-09-21 MED ORDER — PANTOPRAZOLE SODIUM 40 MG PO TBEC: 40.0000 mg | DELAYED_RELEASE_TABLET | Freq: Two times a day (BID) | ORAL | 3 refills | Status: DC

## 2022-09-21 MED ORDER — PANCRELIPASE (LIP-PROT-AMYL) 36000-114000 UNITS PO CPEP: ORAL_CAPSULE | ORAL | 2 refills | Status: DC

## 2022-09-21 MED ORDER — FOLIC ACID 1 MG PO TABS: 1.0000 mg | ORAL_TABLET | Freq: Every day | ORAL | 0 refills | Status: DC

## 2022-09-21 MED ORDER — HYDROCODONE-ACETAMINOPHEN 5-325 MG PO TABS: 1.0000 | ORAL_TABLET | Freq: Four times a day (QID) | ORAL | 0 refills | Status: DC | PRN

## 2022-09-21 MED ORDER — CHLORHEXIDINE GLUCONATE 0.12 % MT SOLN: 15.0000 mL | Freq: Two times a day (BID) | OROMUCOSAL | 0 refills | Status: DC

## 2022-09-21 NOTE — Progress Notes (Signed)
Pt discharged to home. DC completed by Betsy Pries RN. See nurse's note for detail.

## 2022-09-21 NOTE — Discharge Summary (Signed)
Physician Discharge Summary  [name] ZOX:[id number] DOB: 1970/10/05 DOA: 09/18/2022  PCP: Pcp, No  Admit date: 09/18/2022 Discharge date: 09/21/2022  Admitted From: Home  Discharge disposition: home   Recommendations for Outpatient Follow-Up:   Follow up with your primary care provider in one week.  Check CBC, BMP, magnesium in the next visit Patient should be discouraged to alcohol, fatty fried foods.  Discharge Diagnosis:   Principal Problem:   Acute on chronic pancreatitis (HCC) Active Problems:   AKI (acute kidney injury) (HCC)   Paroxysmal atrial fibrillation (HCC)   Alcohol use disorder, severe, dependence (HCC)   Cocaine use disorder, severe, dependence (HCC)   Gastroesophageal reflux disease   Hypokalemia  Discharge Condition: Improved.  Diet recommendation: Low-fat diet.  Wound care: None.  Code status: Full.   History of Present Illness:   [name] is a 52 y.o. male with medical history significant for PAF not on anticoagulation due to substance use/nonadherence, WPW s/p ablation, alcohol associated chronic pancreatitis, pancreatic cysts, HTN, HLD, bipolar disorder, depression/anxiety, cocaine use presented to the hospital with abdominal pain, nausea and vomiting after a family reunion.  Patient did have some fried food and couple of beers after which he started developing loose stools epigastric pain nausea and vomiting.  Patient with history of smoking and use of cocaine recently.  Last drink 1 day prior to presentation.  In the ED, patient was hemodynamically stable.  Labs showed sodium at 131 lipase of 19.  Potassium was slightly low at 3.4 with creatinine of 1.4 AST ALT slightly elevated.  Lactate was elevated at 2.1.  Chest x-ray was negative for infiltrate or effusion.  CT scan of the abdomen and pelvis showed  chronic pancreatitis with calcification.   Patient was then considered for admission to the hospital for further evaluation  and treatment.    Hospital Course:   Following conditions were addressed during hospitalization as listed below,  Acute on chronic alcohol associated pancreatitis: Patient with history of alcohol and dietary indiscretion.  CT A/P showed changes of chronic pancreatitis, stable appearing mid body cystic lesion.  He was treated conservatively during hospital stay with IV fluids and Lasix and gradually was increased on oral diet which he has tolerated.  Has been restarted back on Creon.  Patient was extensively counseled regarding quitting alcohol and staying away from fatty fried foods.     Acute kidney injury: Resolved.  Patient initially received IV hydration.  Creatinine of 1.0.  Initial creatinine of 1.4.   Paroxysmal atrial fibrillation: Currently rate controlled.  Not on anticoagulation due to substance abuse and nonadherence as per previous record.  Continue amiodarone and Coreg.   Polysubstance abuse. Reports ongoing tobacco, alcohol, and cocaine use.  No evidence of alcohol withdrawal. He is planning to attend [employer]..  Continue nicotine patch.  Patient has supplies at home.  Counseled against alcohol and cocaine.   Hypokalemia: Resolved.  Improved after replacement.   Hypomagnesemia.  Received magnesium oxide supplement  GERD: CT shows gastric wall thickening and findings suspicious for ulceration.  Patient received IV Protonix 40 mg BID lysine and will be continued on 40 twice daily on discharge..     Chronic pain: Secondary to chronic pancreatitis.  Continue Cymbalta.  Patient is supposed to follow-up with pain management as outpatient.  Encouraged to keep that appointment.  Disposition.  At this time, patient is stable for disposition home with outpatient PCP and pain management follow-up.  Medical Consultants:   None.  Procedures:    None Subjective:   Today, patient was seen and examined at bedside.  Denies any nausea vomiting fever chills or rigors.  Was able  to tolerate a p.o. diet.  Discharge Exam:   Vitals:   09/20/22 1433 09/20/22 1956  BP: (!) 151/92 120/82  Pulse: 70 68  Resp: 20 17  Temp: 97.6 F (36.4 C) 98.2 F (36.8 C)  SpO2: 100% 100%   Vitals:   09/20/22 0407 09/20/22 0759 09/20/22 1433 09/20/22 1956  BP: 124/85 127/85 (!) 151/92 120/82  Pulse: 71 96 70 68  Resp: 15 17 20 17   Temp: 98 F (36.7 C)  97.6 F (36.4 C) 98.2 F (36.8 C)  TempSrc:   Oral Oral  SpO2: 100% 100% 100% 100%   There is no height or weight on file to calculate BMI.  General: Alert awake, not in obvious distress, thinly built HENT: pupils equally reacting to light,  No scleral pallor or icterus noted. Oral mucosa is moist.  Chest:    Diminished breath sounds bilaterally. No crackles or wheezes.  CVS: S1 &S2 heard. No murmur.  Regular rate and rhythm. Abdomen: Soft, mild nonspecific upper quadrant tenderness.  Nondistended.  Bowel sounds are heard.   Extremities: No cyanosis, clubbing or edema.  Peripheral pulses are palpable. Psych: Alert, awake and oriented, normal mood CNS:  No cranial nerve deficits.  Power equal in all extremities.   Skin: Warm and dry.  No rashes noted.  The results of significant diagnostics from this hospitalization (including imaging, microbiology, ancillary and laboratory) are listed below for reference.     Diagnostic Studies:   CT ABDOMEN PELVIS WO CONTRAST  Result Date: 09/18/2022 CLINICAL DATA:  Acute abdominal pain, history of pancreatitis EXAM: CT ABDOMEN AND PELVIS WITHOUT CONTRAST TECHNIQUE: Multidetector CT imaging of the abdomen and pelvis was performed following the standard protocol without IV contrast. RADIATION DOSE REDUCTION: This exam was performed according to the departmental dose-optimization program which includes automated exposure control, adjustment of the mA and/or kV according to patient size and/or use of iterative reconstruction technique. COMPARISON:  09/01/2022 FINDINGS: Lower chest: No acute  abnormality. Hepatobiliary: No focal liver abnormality is seen. No gallstones, gallbladder wall thickening, or biliary dilatation. Pancreas: Scattered pancreatic calcifications are noted consistent with chronic pancreatitis. Cystic lesion is again noted in the body of the pancreas measuring 2.6 x 1.7 cm. Given some variation in the technical imaging factors S is likely stable in appearance. Spleen: Normal in size without focal abnormality. Adrenals/Urinary Tract: Adrenal glands are within normal limits. Kidneys demonstrate a normal appearance. No renal calculi or obstructive changes are seen. Bladder is partially distended. Stomach/Bowel: Appendix is well visualized and within normal limits. No inflammatory changes to suggest appendicitis are seen. No obstructive or inflammatory changes of the colon are noted. The small bowel is within normal limits. Persistent gastric wall thickening is noted. Additionally there are foci of air which extend to the margin of the gastric wall which may represent gastric ulcerations. No evidence of perforation is seen. Vascular/Lymphatic: Aortic atherosclerosis. No enlarged abdominal or pelvic lymph nodes. Reproductive: Prostate is unremarkable. Other: No abdominal wall hernia or abnormality. No abdominopelvic ascites. Musculoskeletal: No acute or significant osseous findings. IMPRESSION: Changes of chronic pancreatitis with calcification. Midbody cystic lesion is noted in the pancreas stable in appearance from the prior exam. Nonemergent MRI may be helpful for further evaluation. Gastric wall thickening with findings suspicious for ulceration. No perforation is noted. No other focal abnormality is seen.  Electronically Signed   By: Alcide Clever M.D.   On: 09/18/2022 17:15   DG Chest 2 View  Result Date: 09/18/2022 CLINICAL DATA:  Chest pain, nausea EXAM: CHEST - 2 VIEW COMPARISON:  09/01/2022 FINDINGS: Normal mediastinum and cardiac silhouette. Normal pulmonary vasculature. No  evidence of effusion, infiltrate, or pneumothorax. No acute bony abnormality. IMPRESSION: Normal chest radiograph Electronically Signed   By: Genevive Bi M.D.   On: 09/18/2022 15:45     Labs:   Basic Metabolic Panel: Recent Labs  Lab 09/18/22 1552 09/18/22 1718 09/18/22 2046 09/19/22 0109 09/21/22 0630  NA 131* 134*  --  133* 134*  K 3.4* 3.6  --  3.4* 3.9  CL 102  --   --  105 102  CO2 11*  --   --  14* 23  GLUCOSE 104*  --   --  78 130*  BUN 15  --   --  10 <5*  CREATININE 1.43*  --   --  1.04 0.80  CALCIUM 8.7*  --   --  8.3* 8.4*  MG  --   --  1.3* 1.4* 1.5*   GFR CrCl cannot be calculated (Unknown ideal weight.). Liver Function Tests: Recent Labs  Lab 09/18/22 1552 09/19/22 0109  AST 87* 71*  ALT 41 33  ALKPHOS 80 64  BILITOT 0.7 0.4  PROT 8.0 6.3*  ALBUMIN 4.1 3.3*   Recent Labs  Lab 09/18/22 1552  LIPASE 19   No results for input(s): "AMMONIA" in the last 168 hours. Coagulation profile No results for input(s): "INR", "PROTIME" in the last 168 hours.  CBC: Recent Labs  Lab 09/18/22 1552 09/18/22 1718 09/19/22 0109 09/21/22 0630  WBC 9.4  --  8.3 7.2  NEUTROABS 5.6  --   --   --   HGB 13.5 16.7 11.3* 10.0*  HCT 40.9 49.0 35.4* 30.9*  MCV 94.0  --  97.0 95.7  PLT 243  --  191 166   Cardiac Enzymes: No results for input(s): "CKTOTAL", "CKMB", "CKMBINDEX", "TROPONINI" in the last 168 hours. BNP: Invalid input(s): "POCBNP" CBG: Recent Labs  Lab 09/18/22 1446 09/18/22 1738  GLUCAP 89 85   D-Dimer No results for input(s): "DDIMER" in the last 72 hours. Hgb A1c No results for input(s): "HGBA1C" in the last 72 hours. Lipid Profile No results for input(s): "CHOL", "HDL", "LDLCALC", "TRIG", "CHOLHDL", "LDLDIRECT" in the last 72 hours. Thyroid function studies No results for input(s): "TSH", "T4TOTAL", "T3FREE", "THYROIDAB" in the last 72 hours.  Invalid input(s): "FREET3" Anemia work up No results for input(s): "VITAMINB12", "FOLATE",  "FERRITIN", "TIBC", "IRON", "RETICCTPCT" in the last 72 hours. Microbiology No results found for this or any previous visit (from the past 240 hour(s)).   Discharge Instructions:   Discharge Instructions     Diet - low sodium heart healthy   Complete by: As directed    Discharge instructions   Complete by: As directed    Follow up with your primary care physician in one week. No alcohol please. Avoid fattty, fried, cheesy and greasy food. Please follow up with pain management clinic as has been scheduled. Seek medical attention for worsening symptoms.   Increase activity slowly   Complete by: As directed       Allergies as of 09/21/2022       Reactions   Robaxin [methocarbamol] Other (See Comments)   jumpy limbs   Aspirin Other (See Comments)   Unknown reaction   Shellfish-derived Products Nausea And Vomiting,  Rash   Trazodone And Nefazodone Other (See Comments)   Muscle spasms   Adhesive [tape] Itching   Latex Itching   Toradol [ketorolac Tromethamine] Other (See Comments)   Has ulcers; cannot have this   Contrast Media [iodinated Contrast Media] Hives   Reglan [metoclopramide] Other (See Comments)   Muscle spasms   Salmon [fish Oil] Nausea And Vomiting, Rash        Medication List     STOP taking these medications    cyclobenzaprine 5 MG tablet Commonly known as: FLEXERIL   magnesium oxide 400 (240 Mg) MG tablet Commonly known as: MAG-OX       TAKE these medications    acetaminophen 650 MG CR tablet Commonly known as: TYLENOL Take 1 tablet (650 mg total) by mouth every 8 (eight) hours as needed for pain. What changed: how much to take   albuterol 108 (90 Base) MCG/ACT inhaler Commonly known as: VENTOLIN HFA Inhale 2 puffs into the lungs every 4 (four) hours as needed for wheezing or shortness of breath.   alum & mag hydroxide-simeth 200-200-20 MG/5ML suspension Commonly known as: MAALOX/MYLANTA Take 30 mLs by mouth every 6 (six) hours as needed for  indigestion or heartburn.   amiodarone 200 MG tablet Commonly known as: PACERONE 200 mg twice daily x 1 week, then switch to 200 mg daily What changed:  how much to take how to take this when to take this additional instructions   carvedilol 12.5 MG tablet Commonly known as: COREG Take 1 tablet (12.5 mg total) by mouth 2 (two) times daily with a meal.   chlorhexidine 0.12 % solution Commonly known as: PERIDEX Use as directed 15 mLs in the mouth or throat 2 (two) times daily.   DULoxetine 30 MG capsule Commonly known as: CYMBALTA Take 2 capsules (60 mg total) by mouth at bedtime.   ferrous sulfate 325 (65 FE) MG tablet Take 325 mg by mouth daily.   fluticasone 50 MCG/ACT nasal spray Commonly known as: FLONASE Place 2 sprays into both nostrils in the morning, at noon, and at bedtime.   folic acid 1 MG tablet Commonly known as: FOLVITE Take 1 tablet (1 mg total) by mouth daily.   HYDROcodone-acetaminophen 5-325 MG tablet Commonly known as: NORCO/VICODIN Take 1 tablet by mouth every 6 (six) hours as needed for moderate pain or severe pain.   lidocaine 2 % solution Commonly known as: XYLOCAINE Use as directed 15 mLs in the mouth or throat every 6 (six) hours as needed for mouth pain.   lipase/protease/amylase 57846 UNITS Cpep capsule Commonly known as: Creon Take 2 capsules (72,000 Units total) by mouth 3 (three) times daily with meals. May also take 1 capsule (36,000 Units total) as needed (with snacks).   loperamide 2 MG capsule Commonly known as: IMODIUM Take 1 capsule (2 mg total) by mouth 4 (four) times daily as needed for diarrhea or loose stools. What changed:  how much to take when to take this   multivitamin with minerals Tabs tablet Take 1 tablet by mouth daily. What changed: how much to take   nicotine 21 mg/24hr patch Commonly known as: NICODERM CQ - dosed in mg/24 hours Place 1 patch (21 mg total) onto the skin at bedtime.   ondansetron 4 MG  disintegrating tablet Commonly known as: ZOFRAN-ODT Take 1 tablet (4 mg total) by mouth 2 (two) times daily as needed for nausea or vomiting.   pantoprazole 40 MG tablet Commonly known as: Protonix Take 1 tablet (  40 mg total) by mouth 2 (two) times daily.   promethazine 25 MG tablet Commonly known as: PHENERGAN Take 25 mg by mouth as needed for nausea or vomiting.   sucralfate 1 GM/10ML suspension Commonly known as: Carafate Take 10 mLs (1 g total) by mouth 4 (four) times daily -  with meals and at bedtime. What changed: when to take this   thiamine 100 MG tablet Commonly known as: VITAMIN B1 Take 1 tablet (100 mg total) by mouth daily.   Vitamin D (Ergocalciferol) 1.25 MG (50000 UNIT) Caps capsule Commonly known as: DRISDOL Take 1 capsule (50,000 Units total) by mouth every Wednesday. What changed: when to take this          Time coordinating discharge: 39 minutes  Signed:  Carlise Stofer  [facility] 09/21/2022, 11:43 AM

## 2022-09-21 NOTE — Progress Notes (Signed)
Discharge instructions given. Patient verbalized understanding and all questions were answered.  ?

## 2022-09-22 ENCOUNTER — Telehealth: Payer: Self-pay | Admitting: Gastroenterology

## 2022-09-22 NOTE — Telephone Encounter (Signed)
Called and LM for patient that Dr. Adela Lank does not endorse Lidocaine solution for GI Sx.  He did not originally send that script for him but just gave him a one time refill as a courtesy in January. If he is having GI Sx he needs to schedule an appointment with [place].

## 2022-09-22 NOTE — Telephone Encounter (Signed)
Patient is requesting refill for  Refill for lidocaine solution 2%  Sent to [facility] on [address].

## 2022-11-04 ENCOUNTER — Emergency Department (HOSPITAL_COMMUNITY): Payer: Medicaid Other

## 2022-11-04 ENCOUNTER — Other Ambulatory Visit: Payer: Self-pay

## 2022-11-04 ENCOUNTER — Encounter (HOSPITAL_COMMUNITY): Payer: Self-pay

## 2022-11-04 ENCOUNTER — Emergency Department (HOSPITAL_COMMUNITY)
Admission: EM | Admit: 2022-11-04 | Discharge: 2022-11-05 | Disposition: A | Discharge status: Home / Self Care | Payer: Medicaid Other | Attending: Emergency Medicine | Admitting: Emergency Medicine

## 2022-11-04 DIAGNOSIS — R1013 Epigastric pain: Secondary | ICD-10-CM | POA: Diagnosis present

## 2022-11-04 DIAGNOSIS — I10 Essential (primary) hypertension: Secondary | ICD-10-CM | POA: Diagnosis not present

## 2022-11-04 DIAGNOSIS — K2950 Unspecified chronic gastritis without bleeding: Secondary | ICD-10-CM | POA: Diagnosis not present

## 2022-11-04 DIAGNOSIS — Z79899 Other long term (current) drug therapy: Secondary | ICD-10-CM | POA: Insufficient documentation

## 2022-11-04 DIAGNOSIS — R519 Headache, unspecified: Secondary | ICD-10-CM | POA: Diagnosis not present

## 2022-11-04 DIAGNOSIS — Z91040 Latex allergy status: Secondary | ICD-10-CM | POA: Diagnosis not present

## 2022-11-04 LAB — CBC
HCT: 40.4 % (ref 39.0–52.0)
Hemoglobin: 12.9 g/dL — ABNORMAL LOW (ref 13.0–17.0)
MCH: 29.2 pg (ref 26.0–34.0)
MCHC: 31.9 g/dL (ref 30.0–36.0)
MCV: 91.4 fL (ref 80.0–100.0)
Platelets: 248 10*3/uL (ref 150–400)
RBC: 4.42 MIL/uL (ref 4.22–5.81)
RDW: 17.2 % — ABNORMAL HIGH (ref 11.5–15.5)
WBC: 8.4 10*3/uL (ref 4.0–10.5)
nRBC: 0 % (ref 0.0–0.2)

## 2022-11-04 LAB — BASIC METABOLIC PANEL
Anion gap: 13 (ref 5–15)
BUN: 6 mg/dL (ref 6–20)
CO2: 12 mmol/L — ABNORMAL LOW (ref 22–32)
Calcium: 8.8 mg/dL — ABNORMAL LOW (ref 8.9–10.3)
Chloride: 108 mmol/L (ref 98–111)
Creatinine, Ser: 0.9 mg/dL (ref 0.61–1.24)
GFR, Estimated: >60 mL/min (ref >60)
Glucose, Bld: 86 mg/dL (ref 70–99)
Potassium: 3.4 mmol/L — ABNORMAL LOW (ref 3.5–5.1)
Sodium: 133 mmol/L — ABNORMAL LOW (ref 135–145)

## 2022-11-04 LAB — TROPONIN I (HIGH SENSITIVITY): Troponin I (High Sensitivity): 6 ng/L (ref <18)

## 2022-11-04 NOTE — ED Triage Notes (Signed)
Pt arrived from home via [facility] c/o chest pain began at 2030 left sided radiating to left arm, light headedness, sob

## 2022-11-04 NOTE — ED Notes (Signed)
Patient transported to X-ray 

## 2022-11-05 ENCOUNTER — Emergency Department (HOSPITAL_COMMUNITY): Payer: Medicaid Other

## 2022-11-05 LAB — HEPATIC FUNCTION PANEL
ALT: 35 U/L (ref 0–44)
AST: 44 U/L — ABNORMAL HIGH (ref 15–41)
Albumin: 4 g/dL (ref 3.5–5.0)
Alkaline Phosphatase: 88 U/L (ref 38–126)
Bilirubin, Direct: 0.2 mg/dL (ref 0.0–0.2)
Indirect Bilirubin: 0.3 mg/dL (ref 0.3–0.9)
Total Bilirubin: 0.5 mg/dL (ref 0.3–1.2)
Total Protein: 7.9 g/dL (ref 6.5–8.1)

## 2022-11-05 LAB — LIPASE, BLOOD: Lipase: 24 U/L (ref 11–51)

## 2022-11-05 LAB — TROPONIN I (HIGH SENSITIVITY): Troponin I (High Sensitivity): 6 ng/L (ref <18)

## 2022-11-05 MED ORDER — ALUM & MAG HYDROXIDE-SIMETH 200-200-20 MG/5ML PO SUSP
30.0000 mL | Freq: Once | ORAL | Status: AC
  Administered 2022-11-05: 30 mL via ORAL
  Filled 2022-11-05: qty 30

## 2022-11-05 MED ORDER — HYDROCODONE-ACETAMINOPHEN 5-325 MG PO TABS: 1.0000 | ORAL_TABLET | ORAL | 0 refills | Status: DC | PRN

## 2022-11-05 MED ORDER — LACTATED RINGERS IV BOLUS
1000.0000 mL | Freq: Once | INTRAVENOUS | Status: AC
  Administered 2022-11-05: 1000 mL via INTRAVENOUS

## 2022-11-05 MED ORDER — ONDANSETRON HCL 4 MG/2ML IJ SOLN
4.0000 mg | Freq: Once | INTRAMUSCULAR | Status: AC
  Administered 2022-11-05: 4 mg via INTRAVENOUS
  Filled 2022-11-05: qty 2

## 2022-11-05 MED ORDER — MORPHINE SULFATE (PF) 4 MG/ML IV SOLN
4.0000 mg | Freq: Once | INTRAVENOUS | Status: AC
  Administered 2022-11-05: 4 mg via INTRAVENOUS
  Filled 2022-11-05: qty 1

## 2022-11-05 MED ORDER — MORPHINE SULFATE (PF) 2 MG/ML IV SOLN
2.0000 mg | Freq: Once | INTRAVENOUS | Status: AC
  Administered 2022-11-05: 2 mg via INTRAVENOUS
  Filled 2022-11-05: qty 1

## 2022-11-05 MED ORDER — DICYCLOMINE HCL 10 MG PO CAPS
10.0000 mg | ORAL_CAPSULE | Freq: Once | ORAL | Status: AC
  Administered 2022-11-05: 10 mg via ORAL
  Filled 2022-11-05: qty 1

## 2022-11-05 MED ORDER — PANTOPRAZOLE SODIUM 40 MG PO TBEC: 40.0000 mg | DELAYED_RELEASE_TABLET | Freq: Every day | ORAL | 0 refills | Status: DC

## 2022-11-05 NOTE — Discharge Instructions (Signed)
You were seen in the ER today for your upper abdominal pain.  Your laboratory studies are reassuring.  You likely are experiencing pain from her chronic gastritis.  You have been provided a refill of your Protonix.  Please take this as prescribed and follow-up with your gastroenterologist.  Return to the ER with any severe symptoms.

## 2022-11-05 NOTE — ED Provider Notes (Signed)
[place] [facility]GENC[facility] DEPARTMENT AT [facility] Provider Note   CSN: [id number] Arrival date & time: 11/04/22  2156     History Chief Complaint  Patient presents with   Chest Pain    [name] is a 52 y.o. male history of polysubstance abuse including alcohol with alcohol associated chronic pancreatitis who presents with report of epigastric pain.  Patient also has history of WPW status post ablation.  Endorses epigastric pain in the left upper quadrant as well, denies chest pain to me, denies shortness of breath, lightheadedness or palpitations but this provider.  States that he is primarily concerned with diarrhea for the last couple of days, treating with Imodium at home.  Recently was restarted on Creon following his admission for acute on chronic pancreatitis in May of this year.  Patient also endorsing abdominal pain, out of his hydrocodone at home for chronic pancreatitis.  Endorses alcohol use in the last 24 hours, states it has been 72 hours since he used cocaine, did smoke marijuana recently as well.  I have reviewed his medical records, history of paroxysmal A-fib not anticoagulated due to compliance/polysubstance use.  HPI     Home Medications Prior to Admission medications   Medication Sig Start Date End Date Taking? Authorizing Provider  acetaminophen (TYLENOL) 650 MG CR tablet Take 1 tablet (650 mg total) by mouth every 8 (eight) hours as needed for pain. Patient taking differently: Take 1,300 mg by mouth every 8 (eight) hours as needed for pain. 07/23/22   Ghimire, Werner Lean, MD  albuterol (VENTOLIN HFA) 108 (90 Base) MCG/ACT inhaler Inhale 2 puffs into the lungs every 4 (four) hours as needed for wheezing or shortness of breath. 07/23/22   Ghimire, Werner Lean, MD  alum & mag hydroxide-simeth (MAALOX/MYLANTA) 200-200-20 MG/5ML suspension Take 30 mLs by mouth every 6 (six) hours as needed for indigestion or heartburn. 09/21/22   Pokhrel, Eleyna Brugh Chester[place], MD   amiodarone (PACERONE) 200 MG tablet 200 mg twice daily x 1 week, then switch to 200 mg daily Patient taking differently: Take 200 mg by mouth daily. 07/23/22   Ghimire, Werner Lean, MD  carvedilol (COREG) 12.5 MG tablet Take 1 tablet (12.5 mg total) by mouth 2 (two) times daily with a meal. 09/21/22   Pokhrel, Paytin Ramakrishnan [place], MD  chlorhexidine (PERIDEX) 0.12 % solution Use as directed 15 mLs in the mouth or throat 2 (two) times daily. 09/21/22   Pokhrel, Daniel Johndrow [place], MD  DULoxetine (CYMBALTA) 30 MG capsule Take 2 capsules (60 mg total) by mouth at bedtime. 07/23/22   Ghimire, Werner Lean, MD  ferrous sulfate 325 (65 FE) MG tablet Take 325 mg by mouth daily. 05/11/21   [provider]  fluticasone (FLONASE) 50 MCG/ACT nasal spray Place 2 sprays into both nostrils in the morning, at noon, and at bedtime.    [provider]  folic acid (FOLVITE) 1 MG tablet Take 1 tablet (1 mg total) by mouth daily. 09/21/22   Pokhrel, Kapena Hamme [place], MD  HYDROcodone-acetaminophen (NORCO/VICODIN) 5-325 MG tablet Take 1 tablet by mouth every 6 (six) hours as needed for moderate pain or severe pain. 09/21/22 09/21/23  Pokhrel, Montavious Wierzba [place], MD  lidocaine (XYLOCAINE) 2 % solution Use as directed 15 mLs in the mouth or throat every 6 (six) hours as needed for mouth pain. 07/23/22   Ghimire, Werner Lean, MD  lipase/protease/amylase (CREON) 36000 UNITS CPEP capsule Take 2 capsules (72,000 Units total) by mouth 3 (three) times daily with meals. May also take 1 capsule (36,000 Units  total) as needed (with snacks). 09/21/22   Pokhrel, Dyan Labarbera Chester[place], MD  loperamide (IMODIUM) 2 MG capsule Take 1 capsule (2 mg total) by mouth 4 (four) times daily as needed for diarrhea or loose stools. Patient taking differently: Take 4 mg by mouth as needed for diarrhea or loose stools. 07/23/22   Ghimire, Werner Lean, MD  Multiple Vitamin (MULTIVITAMIN WITH MINERALS) TABS tablet Take 1 tablet by mouth daily. Patient taking differently: Take 2 tablets by mouth daily. 07/23/22    Ghimire, Werner Lean, MD  nicotine (NICODERM CQ - DOSED IN MG/24 HOURS) 21 mg/24hr patch Place 1 patch (21 mg total) onto the skin at bedtime. Patient not taking: Reported on 09/19/2022 07/23/22   Maretta Bees, MD  ondansetron (ZOFRAN-ODT) 4 MG disintegrating tablet Take 1 tablet (4 mg total) by mouth 2 (two) times daily as needed for nausea or vomiting. 09/21/22   Pokhrel, Sharona Rovner [place], MD  pantoprazole (PROTONIX) 40 MG tablet Take 1 tablet (40 mg total) by mouth 2 (two) times daily. 09/21/22   Pokhrel, Mccrae Speciale [place], MD  promethazine (PHENERGAN) 25 MG tablet Take 25 mg by mouth as needed for nausea or vomiting. 06/30/22   [provider]  sucralfate (CARAFATE) 1 GM/10ML suspension Take 10 mLs (1 g total) by mouth 4 (four) times daily -  with meals and at bedtime. Patient taking differently: Take 1 g by mouth in the morning, at noon, and at bedtime. 09/14/22   [name], Willaim Rayas, MD  thiamine (VITAMIN B1) 100 MG tablet Take 1 tablet (100 mg total) by mouth daily. 07/23/22   Ghimire, Werner Lean, MD  Vitamin D, Ergocalciferol, (DRISDOL) 1.25 MG (50000 UNIT) CAPS capsule Take 1 capsule (50,000 Units total) by mouth every Wednesday. Patient taking differently: Take 50,000 Units by mouth once a week. 07/27/22   Ghimire, Werner Lean, MD  amitriptyline (ELAVIL) 25 MG tablet Take 1 tablet (25 mg total) by mouth at bedtime. Patient not taking: Reported on 08/08/2019 10/15/18 08/08/19  Rolly Salter, MD      Allergies    Robaxin [methocarbamol], Aspirin, Shellfish-derived products, Trazodone and nefazodone, Adhesive [tape], Latex, Toradol [ketorolac tromethamine], Contrast media [iodinated contrast media], Reglan [metoclopramide], and Salmon [fish oil]    Review of Systems   Review of Systems  Constitutional:  Positive for appetite change. Negative for activity change, fatigue and fever.  HENT: Negative.    Respiratory: Negative.    Cardiovascular:  Positive for chest pain. Negative for palpitations and leg  swelling.  Gastrointestinal:  Positive for abdominal pain, diarrhea and nausea. Negative for vomiting.  Genitourinary: Negative.   Musculoskeletal: Negative.   Skin: Negative.   Neurological:  Positive for syncope. Negative for weakness.    Physical Exam Updated Vital Signs BP (!) 181/99 (BP Location: Right Arm)   Pulse 93   Temp 97.9 F (36.6 C) (Oral)   Resp 19   Ht 5\' 9"  (1.753 m)   Wt 61.2 kg   SpO2 99%   BMI 19.94 kg/m  Physical Exam Vitals and nursing note reviewed.  Constitutional:      Appearance: He is not ill-appearing or toxic-appearing.  HENT:     Head: Normocephalic and atraumatic.     Mouth/Throat:     Mouth: Mucous membranes are moist.     Pharynx: No oropharyngeal exudate or posterior oropharyngeal erythema.  Eyes:     General:        Right eye: No discharge.        Left eye: No discharge.  Extraocular Movements: Extraocular movements intact.     Conjunctiva/sclera: Conjunctivae normal.     Pupils: Pupils are equal, round, and reactive to light.  Cardiovascular:     Rate and Rhythm: Normal rate and regular rhythm.     Pulses: Normal pulses.     Heart sounds: Normal heart sounds. No murmur heard. Pulmonary:     Effort: Pulmonary effort is normal. No respiratory distress.     Breath sounds: Normal breath sounds. No wheezing or rales.  Abdominal:     General: Bowel sounds are normal. There is no distension.     Palpations: Abdomen is soft.     Tenderness: There is abdominal tenderness in the epigastric area, periumbilical area and left upper quadrant. There is guarding. There is no right CVA tenderness, left CVA tenderness or rebound.  Musculoskeletal:        General: No deformity.     Cervical back: Neck supple.     Right lower leg: No edema.     Left lower leg: No edema.  Skin:    General: Skin is warm and dry.     Capillary Refill: Capillary refill takes less than 2 seconds.  Neurological:     General: No focal deficit present.     Mental  Status: He is alert and oriented to person, place, and time. Mental status is at baseline.  Psychiatric:        Mood and Affect: Mood normal.    ED Results / Procedures / Treatments   Labs (all labs ordered are listed, but only abnormal results are displayed) Labs Reviewed  BASIC METABOLIC PANEL - Abnormal; Notable for the following components:      Result Value   Sodium 133 (*)    Potassium 3.4 (*)    CO2 12 (*)    Calcium 8.8 (*)    All other components within normal limits  CBC - Abnormal; Notable for the following components:   Hemoglobin 12.9 (*)    RDW 17.2 (*)    All other components within normal limits  TROPONIN I (HIGH SENSITIVITY)  TROPONIN I (HIGH SENSITIVITY)    EKG None  Radiology DG Chest 2 View  Result Date: 11/04/2022 CLINICAL DATA:  Chest pain EXAM: CHEST - 2 VIEW COMPARISON:  09/18/2022 FINDINGS: Lungs are clear.  No pleural effusion or pneumothorax. The heart is normal in size. Visualized osseous structures are within normal limits. IMPRESSION: Normal chest radiographs. Electronically Signed   By: Charline Bills M.D.   On: 11/04/2022 22:41    Procedures Procedures    Medications Ordered in ED Medications - No data to display  ED Course/ Medical Decision Making/ A&P                             Medical Decision Making 52 year old male with chronic pancreatitis presents epigastric pain  Mildly hypertensive, vitals otherwise normal.  Cardiopulmonary exam is normal, abdominal exam as above with generalized tenderness palpation worse in the epigastrium with guarding but without rebound.  No CVAT.  Differential diagnosis of epigastric pain includes but is not limited to: Functional or nonulcer dyspepsia (MCC), PUD, GERD, Gastritis, (NSAIDs, alcohol, stress, H. pylori, pernicious anemia), pancreatitis / pancreatic cancer, overeating indigestion (high-fat foods, coffee), drugs (aspirin, antibiotics (eg, macrolides, metronidazole), corticosteroids,  digoxin, narcotics, theophylline), gastroparesis, gastric volvulus, gastric cancer, lactose intolerance, malabsorption, parasitic infection (Giardia, Strongyloides, Ascaris), abdominal hernia, intestinal ischemia, esophageal rupture,  cholelithiasis /choledocholithiasis / cholangitis, hepatitis, ACS,  pericarditis, pneumonia, pregnancy.   Amount and/or Complexity of Data Reviewed Labs: ordered.    Details: CBC without leukocytosis but with mild anemia with hemoglobin of 12.9.  BMP with mild hyponatremia of 133, hypokalemia of 3.4, otherwise unremarkable.  Hepatic function test with very mildly elevated AST to 44, troponin negative x 2, lipase is normal at 24.  Radiology: ordered.    Details:  Chest x-ray negative for acute cardiopulmonary disease, CT of the head and abdomen pelvis negative for acute intracranial or intra-abdominal abnormality.  Question gastritis.  Known pancreatic pseudocyst.  Risk OTC drugs. Prescription drug management.    Patient administered multiple doses of pain medication and GI cocktail in the emergency department.  He is hemodynamically stable with reassuring laboratory studies. Patient is currently pain free following GI cocktail.  Recommend close outpatient follow-up, will refill Protonix tonight.  Clinical picture most consistent with gastritis.  Clinical concern for emergent underlying etiology that would warrant further ED workup or inpatient management is low. Dquan voiced understanding of his medical evaluation and treatment plan. Each of their questions answered to their expressed satisfaction.  Return precautions were given.  Patient is well-appearing, stable, and was discharged in good condition.  This chart was dictated using voice recognition software, Dragon. Despite the best efforts of this provider to proofread and correct errors, errors may still occur which can change documentation meaning.    Final Clinical Impression(s) / ED Diagnoses Final  diagnoses:  None    Rx / DC Orders ED Discharge Orders     None         Paris Lore, PA-C 11/05/22 0355    Gilda Crease, MD 11/07/22 [phone]

## 2022-11-15 ENCOUNTER — Emergency Department (HOSPITAL_COMMUNITY): Payer: MEDICAID

## 2022-11-15 ENCOUNTER — Encounter (HOSPITAL_COMMUNITY): Payer: Self-pay

## 2022-11-15 ENCOUNTER — Emergency Department (HOSPITAL_COMMUNITY)
Admission: EM | Admit: 2022-11-15 | Discharge: 2022-11-15 | Disposition: A | Discharge status: Home / Self Care | Payer: MEDICAID | Attending: Emergency Medicine | Admitting: Emergency Medicine

## 2022-11-15 DIAGNOSIS — Z91040 Latex allergy status: Secondary | ICD-10-CM | POA: Diagnosis not present

## 2022-11-15 DIAGNOSIS — R109 Unspecified abdominal pain: Secondary | ICD-10-CM | POA: Diagnosis present

## 2022-11-15 DIAGNOSIS — R1084 Generalized abdominal pain: Secondary | ICD-10-CM | POA: Diagnosis not present

## 2022-11-15 LAB — CBC
HCT: 27 % — ABNORMAL LOW (ref 39.0–52.0)
Hemoglobin: 8.8 g/dL — ABNORMAL LOW (ref 13.0–17.0)
MCH: 29.1 pg (ref 26.0–34.0)
MCHC: 32.6 g/dL (ref 30.0–36.0)
MCV: 89.4 fL (ref 80.0–100.0)
Platelets: 229 10*3/uL (ref 150–400)
RBC: 3.02 MIL/uL — ABNORMAL LOW (ref 4.22–5.81)
RDW: 17.8 % — ABNORMAL HIGH (ref 11.5–15.5)
WBC: 7.5 10*3/uL (ref 4.0–10.5)
nRBC: 0 % (ref 0.0–0.2)

## 2022-11-15 LAB — BASIC METABOLIC PANEL
Anion gap: 10 (ref 5–15)
BUN: 7 mg/dL (ref 6–20)
CO2: 17 mmol/L — ABNORMAL LOW (ref 22–32)
Calcium: 8 mg/dL — ABNORMAL LOW (ref 8.9–10.3)
Chloride: 110 mmol/L (ref 98–111)
Creatinine, Ser: 0.76 mg/dL (ref 0.61–1.24)
GFR, Estimated: >60 mL/min (ref >60)
Glucose, Bld: 82 mg/dL (ref 70–99)
Potassium: 3.9 mmol/L (ref 3.5–5.1)
Sodium: 137 mmol/L (ref 135–145)

## 2022-11-15 LAB — TROPONIN I (HIGH SENSITIVITY)
Troponin I (High Sensitivity): 7 ng/L (ref <18)
Troponin I (High Sensitivity): 7 ng/L (ref <18)

## 2022-11-15 LAB — HEMOGLOBIN AND HEMATOCRIT, BLOOD
HCT: 32.8 % — ABNORMAL LOW (ref 39.0–52.0)
Hemoglobin: 10.4 g/dL — ABNORMAL LOW (ref 13.0–17.0)

## 2022-11-15 LAB — LIPASE, BLOOD: Lipase: 29 U/L (ref 11–51)

## 2022-11-15 MED ORDER — FAMOTIDINE IN NACL 20-0.9 MG/50ML-% IV SOLN
20.0000 mg | Freq: Once | INTRAVENOUS | Status: AC
  Administered 2022-11-15: 20 mg via INTRAVENOUS
  Filled 2022-11-15: qty 50

## 2022-11-15 MED ORDER — LIDOCAINE VISCOUS HCL 2 % MT SOLN: 15.0000 mL | Freq: Four times a day (QID) | OROMUCOSAL | 0 refills | Status: DC | PRN

## 2022-11-15 MED ORDER — SODIUM CHLORIDE 0.9 % IV BOLUS
1000.0000 mL | Freq: Once | INTRAVENOUS | Status: AC
  Administered 2022-11-15: 1000 mL via INTRAVENOUS

## 2022-11-15 MED ORDER — PANTOPRAZOLE SODIUM 40 MG PO TBEC: 40.0000 mg | DELAYED_RELEASE_TABLET | Freq: Every day | ORAL | 0 refills | Status: DC

## 2022-11-15 MED ORDER — HYDROMORPHONE HCL 1 MG/ML IJ SOLN
1.0000 mg | Freq: Once | INTRAMUSCULAR | Status: AC
  Administered 2022-11-15: 1 mg via INTRAVENOUS
  Filled 2022-11-15: qty 1

## 2022-11-15 MED ORDER — SUCRALFATE 1 GM/10ML PO SUSP
1.0000 g | Freq: Once | ORAL | Status: AC
  Administered 2022-11-15: 1 g via ORAL
  Filled 2022-11-15: qty 10

## 2022-11-15 NOTE — ED Provider Notes (Signed)
[place] EMERGENCY DEPARTMENT AT [facility] Provider Note   CSN: [id number] Arrival date & time: 11/15/22  0011     History  Chief Complaint  Patient presents with   Chest Pain    [name] is a 52 y.o. male.  HPI Patient presents with left-sided little pain.  He notes the pain is similar to that which he experienced prior to admission a month ago. Patient acknowledges history of chronic pancreatitis, prior polysubstance abuse, states that he is not currently using illicit substances.  No clear precipitant, but over the past days he has developed increasing left-sided abdominal pain, nausea, anorexia, no actual vomiting.     Home Medications Prior to Admission medications   Medication Sig Start Date End Date Taking? Authorizing Provider  acetaminophen (TYLENOL) 650 MG CR tablet Take 1 tablet (650 mg total) by mouth every 8 (eight) hours as needed for pain. Patient taking differently: Take 1,300 mg by mouth every 8 (eight) hours as needed for pain. 07/23/22   Ghimire, Werner Lean, MD  albuterol (VENTOLIN HFA) 108 (90 Base) MCG/ACT inhaler Inhale 2 puffs into the lungs every 4 (four) hours as needed for wheezing or shortness of breath. 07/23/22   Ghimire, Werner Lean, MD  alum & mag hydroxide-simeth (MAALOX/MYLANTA) 200-200-20 MG/5ML suspension Take 30 mLs by mouth every 6 (six) hours as needed for indigestion or heartburn. 09/21/22   Pokhrel, Rebekah Chesterfield, MD  amiodarone (PACERONE) 200 MG tablet 200 mg twice daily x 1 week, then switch to 200 mg daily Patient taking differently: Take 200 mg by mouth daily. 07/23/22   Ghimire, Werner Lean, MD  carvedilol (COREG) 12.5 MG tablet Take 1 tablet (12.5 mg total) by mouth 2 (two) times daily with a meal. 09/21/22   Pokhrel, Rebekah [place], MD  chlorhexidine (PERIDEX) 0.12 % solution Use as directed 15 mLs in the mouth or throat 2 (two) times daily. 09/21/22   Pokhrel, Rebekah [place], MD  DULoxetine (CYMBALTA) 30 MG capsule Take 2 capsules (60 mg  total) by mouth at bedtime. 07/23/22   Ghimire, Werner Lean, MD  ferrous sulfate 325 (65 FE) MG tablet Take 325 mg by mouth daily. 05/11/21   [provider]  fluticasone (FLONASE) 50 MCG/ACT nasal spray Place 2 sprays into both nostrils in the morning, at noon, and at bedtime.    [provider]  folic acid (FOLVITE) 1 MG tablet Take 1 tablet (1 mg total) by mouth daily. 09/21/22   Pokhrel, Rebekah [place], MD  HYDROcodone-acetaminophen (NORCO/VICODIN) 5-325 MG tablet Take 1 tablet by mouth every 4 (four) hours as needed. 11/05/22   Sponseller, Lupe Carney R, PA-C  lidocaine (XYLOCAINE) 2 % solution Use as directed 15 mLs in the mouth or throat every 6 (six) hours as needed for mouth pain. 07/23/22   Ghimire, Werner Lean, MD  lipase/protease/amylase (CREON) 36000 UNITS CPEP capsule Take 2 capsules (72,000 Units total) by mouth 3 (three) times daily with meals. May also take 1 capsule (36,000 Units total) as needed (with snacks). 09/21/22   Pokhrel, Rebekah Chester[place], MD  loperamide (IMODIUM) 2 MG capsule Take 1 capsule (2 mg total) by mouth 4 (four) times daily as needed for diarrhea or loose stools. Patient taking differently: Take 4 mg by mouth as needed for diarrhea or loose stools. 07/23/22   Ghimire, Werner Lean, MD  Multiple Vitamin (MULTIVITAMIN WITH MINERALS) TABS tablet Take 1 tablet by mouth daily. Patient taking differently: Take 2 tablets by mouth daily. 07/23/22   Ghimire, Werner Lean, MD  nicotine (NICODERM  CQ - DOSED IN MG/24 HOURS) 21 mg/24hr patch Place 1 patch (21 mg total) onto the skin at bedtime. Patient not taking: Reported on 09/19/2022 07/23/22   Maretta Bees, MD  ondansetron (ZOFRAN-ODT) 4 MG disintegrating tablet Take 1 tablet (4 mg total) by mouth 2 (two) times daily as needed for nausea or vomiting. 09/21/22   Pokhrel, Rebekah Chesterfield, MD  pantoprazole (PROTONIX) 40 MG tablet Take 1 tablet (40 mg total) by mouth daily. 11/05/22   Sponseller, Eugene Gavia, PA-C  promethazine (PHENERGAN) 25 MG tablet Take  25 mg by mouth as needed for nausea or vomiting. 06/30/22   [provider]  sucralfate (CARAFATE) 1 GM/10ML suspension Take 10 mLs (1 g total) by mouth 4 (four) times daily -  with meals and at bedtime. Patient taking differently: Take 1 g by mouth in the morning, at noon, and at bedtime. 09/14/22   [name], Willaim Rayas, MD  thiamine (VITAMIN B1) 100 MG tablet Take 1 tablet (100 mg total) by mouth daily. 07/23/22   Ghimire, Werner Lean, MD  Vitamin D, Ergocalciferol, (DRISDOL) 1.25 MG (50000 UNIT) CAPS capsule Take 1 capsule (50,000 Units total) by mouth every Wednesday. Patient taking differently: Take 50,000 Units by mouth once a week. 07/27/22   Ghimire, Werner Lean, MD  amitriptyline (ELAVIL) 25 MG tablet Take 1 tablet (25 mg total) by mouth at bedtime. Patient not taking: Reported on 08/08/2019 10/15/18 08/08/19  Rolly Salter, MD      Allergies    Robaxin [methocarbamol], Aspirin, Shellfish-derived products, Trazodone and nefazodone, Adhesive [tape], Latex, Toradol [ketorolac tromethamine], Contrast media [iodinated contrast media], Reglan [metoclopramide], and Salmon [fish oil]    Review of Systems   Review of Systems  Physical Exam Updated Vital Signs BP (!) 128/95 (BP Location: Left Arm)   Pulse (!) 113   Temp (!) 97.3 F (36.3 C) (Oral)   Resp 18   SpO2 100%  Physical Exam  ED Results / Procedures / Treatments   Labs (all labs ordered are listed, but only abnormal results are displayed) Labs Reviewed  BASIC METABOLIC PANEL - Abnormal; Notable for the following components:      Result Value   CO2 17 (*)    Calcium 8.0 (*)    All other components within normal limits  CBC - Abnormal; Notable for the following components:   RBC 3.02 (*)    Hemoglobin 8.8 (*)    HCT 27.0 (*)    RDW 17.8 (*)    All other components within normal limits  LIPASE, BLOOD  HEMOGLOBIN AND HEMATOCRIT, BLOOD  TROPONIN I (HIGH SENSITIVITY)  TROPONIN I (HIGH SENSITIVITY)     EKG None  Radiology DG Chest 2 View  Result Date: 11/15/2022 CLINICAL DATA:  Dyspnea and chest pain onset yesterday. EXAM: CHEST - 2 VIEW COMPARISON:  PA and lateral 11/04/2022 FINDINGS: The heart size and mediastinal contours are within normal limits. Both lungs are clear. The visualized skeletal structures are unremarkable. IMPRESSION: No active cardiopulmonary disease.  Stable chest. Electronically Signed   By: Almira Bar M.D.   On: 11/15/2022 00:58    Procedures Procedures    Medications Ordered in ED Medications  HYDROmorphone (DILAUDID) injection 1 mg (has no administration in time range)  sucralfate (CARAFATE) 1 GM/10ML suspension 1 g (1 g Oral Given 11/15/22 0949)  famotidine (PEPCID) IVPB 20 mg premix (0 mg Intravenous Stopped 11/15/22 1019)  sodium chloride 0.9 % bolus 1,000 mL (1,000 mLs Intravenous New Bag/Given 11/15/22 0947)  ED Course/ Medical Decision Making/ A&P                             Medical Decision Making Adult male with chronic pain, prior gastritis, pancreatitis presents with left-sided abdominal pain.  Given his history, recurrence is a consideration.  As this perforation, anemia, less likely ACS. Labs reviewed, similar with multiple prior studies, aside from anemia.  On secondary discussion patient notes that he has had episodes of blood in stool requiring transfusion.  Today's values are not low enough to substantiate transfusion, but the patient can follow-up in this regard with his gastroenterologist. Patient had fluids, Pepcid, Carafate, and he and I discussed importance of outpatient follow-up for his chronic pain management.  With no peritonitis, no acute hypotension, after fluids resulted in improved heart rate, patient discharged in stable condition to follow-up with GI, pain management.  Amount and/or Complexity of Data Reviewed External Data Reviewed: notes.    Details: 8 ED visits over the past 6 months, including 2 weeks ago, with pertinent  note included below Labs: ordered. Decision-making details documented in ED Course. Radiology: ordered. ECG/medicine tests:  Decision-making details documented in ED Course.  Risk Prescription drug management. Decision regarding hospitalization. Diagnosis or treatment significantly limited by social determinants of health.  Per HPI from ED visit last month:   Arrival date & time: 11/04/22  2156     History    Chief Complaint  Patient presents with   Chest Pain      "[name] is a 52 y.o. male history of polysubstance abuse including alcohol with alcohol associated chronic pancreatitis who presents with report of epigastric pain.  Patient also has history of WPW status post ablation.  Endorses epigastric pain in the left upper quadrant as well, denies chest pain to me, denies shortness of breath, lightheadedness or palpitations but this provider.  States that he is primarily concerned with diarrhea for the last couple of days, treating with Imodium at home.  Recently was restarted on Creon following his admission for acute on chronic pancreatitis in May of this year.  Patient also endorsing abdominal pain, out of his hydrocodone at home for chronic pancreatitis.  Endorses alcohol use in the last 24 hours, states it has been 72 hours since he used cocaine, did smoke marijuana recently as well."    10:37 AM Patient in no distress, resting, wearing headphones, watching something on his cellular telephone. As above, labs generally reassuring, suspicion for acute on chronic pain given his otherwise reassuring findings.  He and I discussed interventions, appropriate follow-up, patient discharged in stable condition.        Final Clinical Impression(s) / ED Diagnoses Final diagnoses:  Generalized abdominal pain    Rx / DC Orders ED Discharge Orders     None         Gerhard Munch, MD 11/15/22 1038

## 2022-11-15 NOTE — ED Triage Notes (Addendum)
Chest pain since noon yesterday, he has had this pain before, pain is mid sternal going to jaw. Expresses some dyspnea with exertion. Also feels like his pancreatitis is acting up   Medic Vitals  114/82 98hr 100% 16  18g left forearm

## 2022-11-15 NOTE — Discharge Instructions (Signed)
As discussed, it is importantly follow-up with your gastroenterologist, and engage with a pain medicine clinic.  If you are unable to follow-up with your prior clinic, another option has been included below.  Call to see if they are accepting new patients. Return here for concerning changes in your condition.

## 2022-11-15 NOTE — ED Notes (Signed)
Pt was called x3 no answer 

## 2022-11-16 ENCOUNTER — Ambulatory Visit: Payer: Self-pay | Admitting: *Deleted

## 2022-11-16 NOTE — Telephone Encounter (Signed)
  Chief Complaint: headache, rectal bleeding Symptoms: headache- migraine- sharp stabbing pain in back of head, eye pain, joint stiffness- patient has seizure hx and feels he is at risk with symptoms. Rectal bleeding- dark red blood and clots in stool, weakness, diarrhea- 3 days- hx blood transfusion for anemia, chest pain, abdominal pain Frequency: headache started today, rectal bleeding- 3 days  Disposition: [x] ED /[] Urgent Care (no appt availability in office) / [] Appointment(In office/virtual)/ []  [facility]/ [] Home Care/ [] Refused Recommended Disposition /[] [facility]/ []  Follow-up with PCP Additional Notes: Patient advised ED for symptoms - dues to heat advised patient do not walk to hospital- call 911 for transportation

## 2022-11-16 NOTE — Telephone Encounter (Signed)
Call placed to patient unable to reach . Unable to leave message VM not set-up Call placed to Advise that patient had not been seen here since 2021. Will have to be established as a new patient. We will schedule him an appointment at [facility] for ED follow-up and have him come back for new patient visit after that appointment.
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
## 2022-11-17 ENCOUNTER — Telehealth: Payer: Self-pay | Admitting: Gastroenterology

## 2022-11-17 NOTE — Telephone Encounter (Signed)
Mr [name]  has brought his insurance card in to be scanned.

## 2022-11-17 NOTE — Telephone Encounter (Signed)
Called patient; [facility] requires a copy of his insurance card front and back and we don't have that. He said he will send it via MyChart or come by so we can make a copy. Referral form printed and completed.  Copies of records printed to accompany referral. Patient was informed that Dr. Adela Lank returns on Monday and I will need him to approve the referral.  He expressed understanding.

## 2022-11-17 NOTE — Telephone Encounter (Signed)
Inbound call from patient providing information for [facility] [phone] states he has already contacted them and they are accepting referrals. Please advise.

## 2022-11-21 NOTE — Telephone Encounter (Signed)
 Yes that is fine  Thanks   

## 2022-11-21 NOTE — Telephone Encounter (Signed)
PT returned call. Informed him that the referral has been sent to [facility]

## 2022-11-21 NOTE — Telephone Encounter (Signed)
Referral faxed to [facility]. Tried to call patient but VM has not been set up

## 2022-11-21 NOTE — Telephone Encounter (Signed)
PT is calling to get an update on the referral that should be sent to [facility]. Please advise.

## 2022-11-22 NOTE — Telephone Encounter (Signed)
PT was advised by [facility] that they never received the referral. It needs to be refaxed to [phone]

## 2022-11-22 NOTE — Telephone Encounter (Signed)
[facility]'s fax number is [phone]. Referral was faxed and confirmation rec'd. Tried to call patient but can't leave a message. If patient calls back, please confirm the exact name of the pain management clinic he is interested in seeing. I have not been able to find one with that fax # but I assume it is in [place] area. ?

## 2022-11-23 NOTE — Telephone Encounter (Signed)
Inbound call from patient stating that [facility] only does injections and he does not qualify and needs help finding somewhere else to go. Please advise.

## 2022-11-23 NOTE — Telephone Encounter (Signed)
Patient has been referred multiple times to multiple pain management clinics, some won't take him bc of his insurance. He has been advised that he will need to determine which will take his insurance and in a location he is willing to travel to and we will be happy to refer him.

## 2022-11-24 ENCOUNTER — Emergency Department (HOSPITAL_COMMUNITY)
Admission: EM | Admit: 2022-11-24 | Discharge: 2022-11-24 | Disposition: A | Discharge status: Home / Self Care | Payer: MEDICAID | Attending: Emergency Medicine | Admitting: Emergency Medicine

## 2022-11-24 ENCOUNTER — Encounter (HOSPITAL_COMMUNITY): Payer: Self-pay

## 2022-11-24 ENCOUNTER — Emergency Department (HOSPITAL_COMMUNITY): Payer: MEDICAID

## 2022-11-24 ENCOUNTER — Other Ambulatory Visit: Payer: Self-pay

## 2022-11-24 ENCOUNTER — Telehealth: Payer: Self-pay | Admitting: Surgery

## 2022-11-24 DIAGNOSIS — Z91040 Latex allergy status: Secondary | ICD-10-CM | POA: Insufficient documentation

## 2022-11-24 DIAGNOSIS — Z79899 Other long term (current) drug therapy: Secondary | ICD-10-CM | POA: Insufficient documentation

## 2022-11-24 DIAGNOSIS — K861 Other chronic pancreatitis: Secondary | ICD-10-CM | POA: Insufficient documentation

## 2022-11-24 DIAGNOSIS — R1013 Epigastric pain: Secondary | ICD-10-CM | POA: Diagnosis present

## 2022-11-24 LAB — CBC WITH DIFFERENTIAL/PLATELET
Abs Immature Granulocytes: 0.02 10*3/uL (ref 0.00–0.07)
Basophils Absolute: 0 10*3/uL (ref 0.0–0.1)
Basophils Relative: 1 %
Eosinophils Absolute: 0.2 10*3/uL (ref 0.0–0.5)
Eosinophils Relative: 3 %
HCT: 30.4 % — ABNORMAL LOW (ref 39.0–52.0)
Hemoglobin: 9.7 g/dL — ABNORMAL LOW (ref 13.0–17.0)
Immature Granulocytes: 0 %
Lymphocytes Relative: 33 %
Lymphs Abs: 1.7 10*3/uL (ref 0.7–4.0)
MCH: 28.8 pg (ref 26.0–34.0)
MCHC: 31.9 g/dL (ref 30.0–36.0)
MCV: 90.2 fL (ref 80.0–100.0)
Monocytes Absolute: 0.9 10*3/uL (ref 0.1–1.0)
Monocytes Relative: 17 %
Neutro Abs: 2.4 10*3/uL (ref 1.7–7.7)
Neutrophils Relative %: 46 %
Platelets: 245 10*3/uL (ref 150–400)
RBC: 3.37 MIL/uL — ABNORMAL LOW (ref 4.22–5.81)
RDW: 17 % — ABNORMAL HIGH (ref 11.5–15.5)
WBC: 5.2 10*3/uL (ref 4.0–10.5)
nRBC: 0 % (ref 0.0–0.2)

## 2022-11-24 LAB — COMPREHENSIVE METABOLIC PANEL
ALT: 23 U/L (ref 0–44)
AST: 30 U/L (ref 15–41)
Albumin: 3.7 g/dL (ref 3.5–5.0)
Alkaline Phosphatase: 80 U/L (ref 38–126)
Anion gap: 9 (ref 5–15)
BUN: 10 mg/dL (ref 6–20)
CO2: 18 mmol/L — ABNORMAL LOW (ref 22–32)
Calcium: 8.5 mg/dL — ABNORMAL LOW (ref 8.9–10.3)
Chloride: 108 mmol/L (ref 98–111)
Creatinine, Ser: 1.06 mg/dL (ref 0.61–1.24)
GFR, Estimated: >60 mL/min (ref >60)
Glucose, Bld: 89 mg/dL (ref 70–99)
Potassium: 4.2 mmol/L (ref 3.5–5.1)
Sodium: 135 mmol/L (ref 135–145)
Total Bilirubin: 0.4 mg/dL (ref 0.3–1.2)
Total Protein: 6.9 g/dL (ref 6.5–8.1)

## 2022-11-24 LAB — URINALYSIS, ROUTINE W REFLEX MICROSCOPIC
Bilirubin Urine: NEGATIVE
Glucose, UA: NEGATIVE mg/dL
Hgb urine dipstick: NEGATIVE
Ketones, ur: NEGATIVE mg/dL
Leukocytes,Ua: NEGATIVE
Nitrite: NEGATIVE
Protein, ur: NEGATIVE mg/dL
Specific Gravity, Urine: 1.015 (ref 1.005–1.030)
pH: 5 (ref 5.0–8.0)

## 2022-11-24 LAB — TROPONIN I (HIGH SENSITIVITY)
Troponin I (High Sensitivity): 7 ng/L (ref <18)
Troponin I (High Sensitivity): 7 ng/L (ref <18)

## 2022-11-24 LAB — LIPASE, BLOOD: Lipase: 29 U/L (ref 11–51)

## 2022-11-24 MED ORDER — SODIUM CHLORIDE 0.9 % IV BOLUS
2000.0000 mL | Freq: Once | INTRAVENOUS | Status: AC
  Administered 2022-11-24: 2000 mL via INTRAVENOUS

## 2022-11-24 MED ORDER — SODIUM CHLORIDE 0.9 % IV SOLN
12.5000 mg | Freq: Once | INTRAVENOUS | Status: AC
  Administered 2022-11-24: 12.5 mg via INTRAVENOUS
  Filled 2022-11-24: qty 12.5

## 2022-11-24 MED ORDER — ONDANSETRON HCL 4 MG/2ML IJ SOLN
4.0000 mg | Freq: Once | INTRAMUSCULAR | Status: AC
  Administered 2022-11-24: 4 mg via INTRAVENOUS
  Filled 2022-11-24: qty 2

## 2022-11-24 MED ORDER — HYDROMORPHONE HCL 1 MG/ML IJ SOLN
0.5000 mg | Freq: Once | INTRAMUSCULAR | Status: AC
  Administered 2022-11-24: 0.5 mg via INTRAVENOUS
  Filled 2022-11-24: qty 1

## 2022-11-24 MED ORDER — LIDOCAINE VISCOUS HCL 2 % MT SOLN: 15.0000 mL | OROMUCOSAL | 0 refills | Status: DC | PRN

## 2022-11-24 MED ORDER — HYDROCODONE-ACETAMINOPHEN 5-325 MG PO TABS
1.0000 | ORAL_TABLET | Freq: Once | ORAL | Status: AC
  Administered 2022-11-24: 1 via ORAL
  Filled 2022-11-24: qty 1

## 2022-11-24 MED ORDER — MORPHINE SULFATE (PF) 4 MG/ML IV SOLN
4.0000 mg | Freq: Once | INTRAVENOUS | Status: AC
  Administered 2022-11-24: 4 mg via INTRAVENOUS
  Filled 2022-11-24: qty 1

## 2022-11-24 MED ORDER — HYDROCODONE-ACETAMINOPHEN 5-325 MG PO TABS: 1.0000 | ORAL_TABLET | Freq: Four times a day (QID) | ORAL | 0 refills | Status: DC | PRN

## 2022-11-24 NOTE — Telephone Encounter (Signed)
Received call from patient concerning [facility] not receiving discharge prescriptions. Sent message to Leroy Kennedy PA-c prescriber to follow up . No further ED RNCM needs identified.

## 2022-11-24 NOTE — ED Provider Notes (Signed)
[facility] EM[facility] DE[facility] AT [facility] Provider Note   CSN: [id number] Arrival date & time: 11/24/22  1402     History  Chief Complaint  Patient presents with   Chest Pain    [name] is a 52 y.o. male, history of WPW, pancreatitis, polysubstance use, who presents to the ED secondary to stabbing epigastric pain, radiating to the back, has been going on since 3 AM this morning.  He states he has associated nausea, and for the last for few days has just felt fatigued, and had some reduced appetite.  Notes this feels very similar to past pancreatic flares, and notes that he tried his home Norco, Carafate, pantoprazole without any relief.  He states he was given fentanyl in the ambulance without any relief.  Denies any shortness of breath, vomiting, blood in the stool, constipation.  Has had a few episodes of diarrhea the last few days.  Endorses drinking about 3 12 ounce beers daily, and last cocaine use was 3 days ago.  Did not take his nitroglycerin today, because he did take his erectile dysfunction medicine.     Home Medications Prior to Admission medications   Medication Sig Start Date End Date Taking? Authorizing Provider  albuterol (VENTOLIN HFA) 108 (90 Base) MCG/ACT inhaler Inhale 2 puffs into the lungs every 4 (four) hours as needed for wheezing or shortness of breath. 07/23/22  Yes Ghimire, Werner Lean, MD  HYDROcodone-acetaminophen (NORCO) 5-325 MG tablet Take 1 tablet by mouth every 6 (six) hours as needed for moderate pain. 11/24/22  Yes Lexine Jaspers L, PA  lidocaine (XYLOCAINE) 2 % solution Use as directed 15 mLs in the mouth or throat as needed for mouth pain. 11/24/22  Yes Yvonda Fouty L, PA  lipase/protease/amylase (CREON) 36000 UNITS CPEP capsule Take 2 capsules (72,000 Units total) by mouth 3 (three) times daily with meals. May also take 1 capsule (36,000 Units total) as needed (with snacks). 09/21/22  Yes Pokhrel, Laxman, MD  nitroGLYCERIN  (NITROSTAT) 0.4 MG SL tablet Place 0.4 mg under the tongue every 5 (five) minutes as needed for chest pain.   Yes [provider]  sucralfate (CARAFATE) 1 GM/10ML suspension Take 10 mLs (1 g total) by mouth 4 (four) times daily -  with meals and at bedtime. Patient taking differently: Take 1 g by mouth See admin instructions. Take 4 times with meals and at bedtime. 09/14/22  Yes [name], Willaim Rayas, MD  acetaminophen (TYLENOL) 650 MG CR tablet Take 1 tablet (650 mg total) by mouth every 8 (eight) hours as needed for pain. Patient not taking: Reported on 11/24/2022 07/23/22   Maretta Bees, MD  alum & mag hydroxide-simeth (MAALOX/MYLANTA) 200-200-20 MG/5ML suspension Take 30 mLs by mouth every 6 (six) hours as needed for indigestion or heartburn. 09/21/22   Pokhrel, Rebekah Chesterfield, MD  amiodarone (PACERONE) 200 MG tablet 200 mg twice daily x 1 week, then switch to 200 mg daily Patient taking differently: Take 200 mg by mouth daily. 07/23/22   Ghimire, Werner Lean, MD  carvedilol (COREG) 12.5 MG tablet Take 1 tablet (12.5 mg total) by mouth 2 (two) times daily with a meal. 09/21/22   Pokhrel, Rebekah Chesterfield, MD  chlorhexidine (PERIDEX) 0.12 % solution Use as directed 15 mLs in the mouth or throat 2 (two) times daily. 09/21/22   Pokhrel, Rebekah Chesterfield, MD  DULoxetine (CYMBALTA) 30 MG capsule Take 2 capsules (60 mg total) by mouth at bedtime. 07/23/22   Ghimire, Werner Lean, MD  ferrous  sulfate 325 (65 FE) MG tablet Take 325 mg by mouth daily. 05/11/21   [provider]  fluticasone (FLONASE) 50 MCG/ACT nasal spray Place 2 sprays into both nostrils in the morning, at noon, and at bedtime.    [provider]  folic acid (FOLVITE) 1 MG tablet Take 1 tablet (1 mg total) by mouth daily. 09/21/22   Pokhrel, Rebekah Chesterfield, MD  loperamide (IMODIUM) 2 MG capsule Take 1 capsule (2 mg total) by mouth 4 (four) times daily as needed for diarrhea or loose stools. Patient taking differently: Take 4 mg by mouth as needed for  diarrhea or loose stools. 07/23/22   Ghimire, Werner Lean, MD  Multiple Vitamin (MULTIVITAMIN WITH MINERALS) TABS tablet Take 1 tablet by mouth daily. Patient taking differently: Take 2 tablets by mouth daily. 07/23/22   Ghimire, Werner Lean, MD  nicotine (NICODERM CQ - DOSED IN MG/24 HOURS) 21 mg/24hr patch Place 1 patch (21 mg total) onto the skin at bedtime. Patient not taking: Reported on 09/19/2022 07/23/22   Maretta Bees, MD  ondansetron (ZOFRAN-ODT) 4 MG disintegrating tablet Take 1 tablet (4 mg total) by mouth 2 (two) times daily as needed for nausea or vomiting. 09/21/22   Pokhrel, Rebekah Chesterfield, MD  pantoprazole (PROTONIX) 40 MG tablet Take 1 tablet (40 mg total) by mouth daily. 11/15/22   Gerhard Munch, MD  promethazine (PHENERGAN) 25 MG tablet Take 25 mg by mouth as needed for nausea or vomiting. 06/30/22   [provider]  thiamine (VITAMIN B1) 100 MG tablet Take 1 tablet (100 mg total) by mouth daily. 07/23/22   Ghimire, Werner Lean, MD  Vitamin D, Ergocalciferol, (DRISDOL) 1.25 MG (50000 UNIT) CAPS capsule Take 1 capsule (50,000 Units total) by mouth every Wednesday. Patient taking differently: Take 50,000 Units by mouth once a week. 07/27/22   Ghimire, Werner Lean, MD  amitriptyline (ELAVIL) 25 MG tablet Take 1 tablet (25 mg total) by mouth at bedtime. Patient not taking: Reported on 08/08/2019 10/15/18 08/08/19  Rolly Salter, MD      Allergies    Robaxin [methocarbamol], Aspirin, Shellfish-derived products, Trazodone and nefazodone, Adhesive [tape], Latex, Toradol [ketorolac tromethamine], Contrast media [iodinated contrast media], Reglan [metoclopramide], and Salmon [fish oil]    Review of Systems   Review of Systems  Respiratory:  Negative for shortness of breath.   Cardiovascular:  Positive for chest pain.    Physical Exam Updated Vital Signs BP (!) 167/99   Pulse 93   Temp 98.8 F (37.1 C) (Oral)   Resp 12   SpO2 97%  Physical Exam Vitals and nursing note reviewed.   Constitutional:      General: He is not in acute distress.    Appearance: He is well-developed.  HENT:     Head: Normocephalic and atraumatic.  Eyes:     Conjunctiva/sclera: Conjunctivae normal.  Cardiovascular:     Rate and Rhythm: Normal rate and regular rhythm.     Heart sounds: No murmur heard. Pulmonary:     Effort: Pulmonary effort is normal. No respiratory distress.     Breath sounds: Normal breath sounds.  Abdominal:     Palpations: Abdomen is soft.     Tenderness: There is abdominal tenderness in the epigastric area.  Musculoskeletal:        General: No swelling.     Cervical back: Neck supple.  Skin:    General: Skin is warm and dry.     Capillary Refill: Capillary refill takes less than 2 seconds.  Neurological:     Mental Status: He is alert.  Psychiatric:        Mood and Affect: Mood normal.     ED Results / Procedures / Treatments   Labs (all labs ordered are listed, but only abnormal results are displayed) Labs Reviewed  CBC WITH DIFFERENTIAL/PLATELET - Abnormal; Notable for the following components:      Result Value   RBC 3.37 (*)    Hemoglobin 9.7 (*)    HCT 30.4 (*)    RDW 17.0 (*)    All other components within normal limits  COMPREHENSIVE METABOLIC PANEL - Abnormal; Notable for the following components:   CO2 18 (*)    Calcium 8.5 (*)    All other components within normal limits  LIPASE, BLOOD  URINALYSIS, ROUTINE W REFLEX MICROSCOPIC  TROPONIN I (HIGH SENSITIVITY)  TROPONIN I (HIGH SENSITIVITY)    EKG None  Radiology DG Chest 1 View  Result Date: 11/24/2022 CLINICAL DATA:  Chest pain EXAM: CHEST  1 VIEW COMPARISON:  11/15/2022 FINDINGS: The heart size and mediastinal contours are within normal limits. Both lungs are clear. The visualized skeletal structures are unremarkable. IMPRESSION: No active disease. Electronically Signed   By: Jasmine Pang M.D.   On: 11/24/2022 15:44    Procedures Procedures    Medications Ordered in  ED Medications  HYDROcodone-acetaminophen (NORCO/VICODIN) 5-325 MG per tablet 1 tablet (1 tablet Oral Given 11/24/22 1447)  ondansetron (ZOFRAN) injection 4 mg (4 mg Intravenous Given 11/24/22 1448)  HYDROmorphone (DILAUDID) injection 0.5 mg (0.5 mg Intravenous Given 11/24/22 1754)  sodium chloride 0.9 % bolus 2,000 mL (0 mLs Intravenous Stopped 11/24/22 2030)  promethazine (PHENERGAN) 12.5 mg in sodium chloride 0.9 % 50 mL IVPB (0 mg Intravenous Stopped 11/24/22 1909)  morphine (PF) 4 MG/ML injection 4 mg (4 mg Intravenous Given 11/24/22 1918)    ED Course/ Medical Decision Making/ A&P                             Medical Decision Making Patient is a 52 year old male, history of chronic pancreatitis, acute pancreatitis, polysubstance use, and WPW, who presents to the ED secondary to epigastric pain radiating to his back this been going on for the last day, with associated nausea, fatigue the days prior.  Denies any chest pain or shortness of breath.  On exam he has a epigastric tenderness but is overall well-appearing.  He is not hypoxic, or short of breath.  We obtained a chest x-ray, blood work, for further evaluation as well as treat his pain.  He states this feels very similar to his past pancreatic flares.  Amount and/or Complexity of Data Reviewed Labs:     Details: Lipase within normal limits, no electrolyte abnormalities, troponin within normal limits Radiology:     Details: Chest x-ray clear ECG/medicine tests:  Decision-making details documented in ED Course. Discussion of management or test interpretation with external provider(s): Discussed with patient, he is feeling much better after pain medications, and he was able to tolerate p.o. intake.  He states that this feels very similar to his past flares, and only that he likely have chronic findings.  I prescribed him a few doses of Norco, as he is between pain doctors for his pancreatitis and he is currently in a flare. His blood pressure  improved as pain management improved and he was discharged in no acute distress and requesting to go home.   Risk Prescription  drug management.    Final Clinical Impression(s) / ED Diagnoses Final diagnoses:  Epigastric pain  Chronic pancreatitis, unspecified pancreatitis type [facility])    Rx / DC Orders ED Discharge Orders          Ordered    lidocaine (XYLOCAINE) 2 % solution  As needed        11/24/22 2039    HYDROcodone-acetaminophen (NORCO) 5-325 MG tablet  Every 6 hours PRN        11/24/22 2039              Pete Pelt, [place] 11/24/22 2141    Laurence Spates, MD 11/25/22 1547

## 2022-11-24 NOTE — ED Triage Notes (Signed)
Pt coming in for chest pain, dizziness, shortness of breath, and nausea that all started within the last few hours. Was recently here on the 9th for chest pain as well. No other complaints have been stated and pt is still expressing pain in chest.   ,edic vitals 170/112 100hr 20rr 99%ra  20g left forearm fentanyl given by [employer]

## 2022-11-24 NOTE — ED Notes (Signed)
Patient ambulated to the bath independently

## 2022-11-24 NOTE — ED Provider Triage Note (Signed)
Emergency Medicine Provider Triage Evaluation Note  [name] , a 52 y.o. male  was evaluated in triage.  Pt complains of chest pain abdominal pain that began this morning.  History of pancreatitis and patient thinks that the pain is pancreatitis related as it is in his epigastric region rating up to his chest.  Patient states he had a few episodes of nonbloody emesis and still nauseous.  Patient denies LOC, history of cirrhosis, hematemesis, blood thinners.  Patient dates he has history of WPW.  Review of Systems  Positive: See HPI Negative: See HPI  Physical Exam  BP (!) 177/113 (BP Location: Right Arm)   Pulse (!) 108   Temp 98.9 F (37.2 C) (Oral)   Resp 17   SpO2 100%  Gen:   Awake, no distress   Resp:  Normal effort  MSK:   Moves extremities without difficulty  Other:  2+ bilateral radial pulses with increased rate, generalized abdominal tenderness without peritoneal signs  Medical Decision Making  Medically screening exam initiated at 2:13 PM.  Appropriate orders placed.  [name] was informed that the remainder of the evaluation will be completed by another provider, this initial triage assessment does not replace that evaluation, and the importance of remaining in the ED until their evaluation is complete.  Workup initiated, patient given Norco and Zofran for symptoms, patient stable at this time.   [name], PA-C 11/24/22 1415

## 2022-11-24 NOTE — Discharge Instructions (Addendum)
Please follow-up with your primary care doctor, return to the ER if you have worsening pain, chest pain, shortness of breath, intractable nausea or vomiting.  I prescribed you a short dose of pain medications, to help you with your abdominal pain.

## 2022-11-28 ENCOUNTER — Emergency Department (HOSPITAL_COMMUNITY): Payer: MEDICAID

## 2022-11-28 ENCOUNTER — Encounter (HOSPITAL_COMMUNITY): Payer: Self-pay

## 2022-11-28 ENCOUNTER — Other Ambulatory Visit: Payer: Self-pay

## 2022-11-28 ENCOUNTER — Emergency Department (HOSPITAL_COMMUNITY)
Admission: EM | Admit: 2022-11-28 | Discharge: 2022-11-28 | Disposition: A | Discharge status: Home / Self Care | Payer: MEDICAID | Attending: Emergency Medicine | Admitting: Emergency Medicine

## 2022-11-28 DIAGNOSIS — R079 Chest pain, unspecified: Secondary | ICD-10-CM | POA: Diagnosis present

## 2022-11-28 DIAGNOSIS — I10 Essential (primary) hypertension: Secondary | ICD-10-CM | POA: Diagnosis not present

## 2022-11-28 DIAGNOSIS — R0602 Shortness of breath: Secondary | ICD-10-CM | POA: Insufficient documentation

## 2022-11-28 DIAGNOSIS — K861 Other chronic pancreatitis: Secondary | ICD-10-CM | POA: Insufficient documentation

## 2022-11-28 DIAGNOSIS — D649 Anemia, unspecified: Secondary | ICD-10-CM | POA: Insufficient documentation

## 2022-11-28 DIAGNOSIS — K219 Gastro-esophageal reflux disease without esophagitis: Secondary | ICD-10-CM | POA: Diagnosis not present

## 2022-11-28 DIAGNOSIS — Z91040 Latex allergy status: Secondary | ICD-10-CM | POA: Insufficient documentation

## 2022-11-28 LAB — COMPREHENSIVE METABOLIC PANEL
ALT: 18 U/L (ref 0–44)
AST: 33 U/L (ref 15–41)
Albumin: 3.4 g/dL — ABNORMAL LOW (ref 3.5–5.0)
Alkaline Phosphatase: 72 U/L (ref 38–126)
Anion gap: 11 (ref 5–15)
BUN: 9 mg/dL (ref 6–20)
CO2: 23 mmol/L (ref 22–32)
Calcium: 8.4 mg/dL — ABNORMAL LOW (ref 8.9–10.3)
Chloride: 104 mmol/L (ref 98–111)
Creatinine, Ser: 0.9 mg/dL (ref 0.61–1.24)
GFR, Estimated: >60 mL/min (ref >60)
Glucose, Bld: 75 mg/dL (ref 70–99)
Potassium: 3.6 mmol/L (ref 3.5–5.1)
Sodium: 138 mmol/L (ref 135–145)
Total Bilirubin: 0.2 mg/dL — ABNORMAL LOW (ref 0.3–1.2)
Total Protein: 6.5 g/dL (ref 6.5–8.1)

## 2022-11-28 LAB — CBC
HCT: 31.7 % — ABNORMAL LOW (ref 39.0–52.0)
Hemoglobin: 10.2 g/dL — ABNORMAL LOW (ref 13.0–17.0)
MCH: 28.4 pg (ref 26.0–34.0)
MCHC: 32.2 g/dL (ref 30.0–36.0)
MCV: 88.3 fL (ref 80.0–100.0)
Platelets: 260 10*3/uL (ref 150–400)
RBC: 3.59 MIL/uL — ABNORMAL LOW (ref 4.22–5.81)
RDW: 17.1 % — ABNORMAL HIGH (ref 11.5–15.5)
WBC: 7.1 10*3/uL (ref 4.0–10.5)
nRBC: 0 % (ref 0.0–0.2)

## 2022-11-28 LAB — TROPONIN I (HIGH SENSITIVITY)
Troponin I (High Sensitivity): 5 ng/L (ref <18)
Troponin I (High Sensitivity): 5 ng/L (ref <18)

## 2022-11-28 LAB — LIPASE, BLOOD: Lipase: 23 U/L (ref 11–51)

## 2022-11-28 MED ORDER — OXYCODONE HCL 5 MG PO TABS
10.0000 mg | ORAL_TABLET | Freq: Once | ORAL | Status: AC
  Administered 2022-11-28: 10 mg via ORAL
  Filled 2022-11-28: qty 2

## 2022-11-28 MED ORDER — SODIUM CHLORIDE 0.9 % IV BOLUS
1000.0000 mL | Freq: Once | INTRAVENOUS | Status: AC
  Administered 2022-11-28: 1000 mL via INTRAVENOUS

## 2022-11-28 MED ORDER — ACETAMINOPHEN 500 MG PO TABS
1000.0000 mg | ORAL_TABLET | Freq: Once | ORAL | Status: AC
  Administered 2022-11-28: 1000 mg via ORAL
  Filled 2022-11-28: qty 2

## 2022-11-28 MED ORDER — MORPHINE SULFATE (PF) 4 MG/ML IV SOLN
4.0000 mg | Freq: Once | INTRAVENOUS | Status: AC
  Administered 2022-11-28: 4 mg via INTRAVENOUS
  Filled 2022-11-28: qty 1

## 2022-11-28 MED ORDER — ONDANSETRON 4 MG PO TBDP
8.0000 mg | ORAL_TABLET | Freq: Once | ORAL | Status: AC
  Administered 2022-11-28: 8 mg via ORAL
  Filled 2022-11-28: qty 2

## 2022-11-28 MED ORDER — LIDOCAINE VISCOUS HCL 2 % MT SOLN: 15.0000 mL | Freq: Four times a day (QID) | OROMUCOSAL | 0 refills | Status: DC | PRN

## 2022-11-28 MED ORDER — HYDROMORPHONE HCL 1 MG/ML IJ SOLN
1.0000 mg | Freq: Once | INTRAMUSCULAR | Status: AC
  Administered 2022-11-28: 1 mg via INTRAVENOUS
  Filled 2022-11-28: qty 1

## 2022-11-28 NOTE — Discharge Instructions (Addendum)
Your CT scan today shows changes of your pancreas which are consistent with chronic pancreatitis.  Please keep hydrated at home and drink plenty water.  Please avoid fatty foods.  You have been prescribed viscous lidocaine which you may use every 4 hours as needed for pain.  You may take up to 1000mg  of tylenol every 6 hours as needed for pain. You may use up to 400mg  ibuprofen every 6 hours sparingly as needed for pain if your pain is not controlled with lidocaine and tylenol.  Your calcium was slightly low here today.  Please incorporate foods into your diet that are high in calcium and vitamin D like cheese and milk.  Return to the ER if you develop any fevers or chills, uncontrolled abdominal pain, shortness of breath, chest pain, any new or concerning symptoms free.  Please follow-up with your pain management team for further management of your chronic pain.

## 2022-11-28 NOTE — ED Provider Notes (Signed)
[place] EMERGENC[facility] DEPARTMENT AT [facility] Provider Note   CSN: [id number] Arrival date & time: 11/28/22  4132     History {Add pertinent medical, surgical, social history, OB history to HPI:1} Chief Complaint  Patient presents with   Chest Pain    [name] is a 52 y.o. male.   Chest Pain   52 year old male presents emergency department with complaints of chest pain.  Patient states that he began with chest pain earlier this morning after he got to an argument with his significant other.  States that they were arguing about politics when symptoms began.  States he walked away to a nearby fire station 2-3 blocks with this stopped and rested due to experience of chest pain and called EMS.  This event mildly short of breath during symptoms experienced earlier.  Currently denies chest pain.  Also states that he had slightly worsening epigastric abdominal pain.  Patient with history of chronic pancreatitis and states it feels like a mild flareup.  States he had a few episodes of emesis earlier today.  Denies any fever, chills, night sweats, cough, hematemesis, coffee-ground emesis, urinary symptoms, change in bowel habits.  Denies any recent cocaine, alcohol or other substance use.  Past medical history significant for hypertension, WPW post ablation, cocaine/marijuana/alcohol use/abuse, chronic bronchitis, chronic pancreatitis, chronic low back pain, GERD, sickle cell trait, anemia  Home Medications Prior to Admission medications   Medication Sig Start Date End Date Taking? Authorizing Provider  acetaminophen (TYLENOL) 650 MG CR tablet Take 1 tablet (650 mg total) by mouth every 8 (eight) hours as needed for pain. Patient not taking: Reported on 11/24/2022 07/23/22   Maretta Bees, MD  albuterol (VENTOLIN HFA) 108 (90 Base) MCG/ACT inhaler Inhale 2 puffs into the lungs every 4 (four) hours as needed for wheezing or shortness of breath. 07/23/22   Ghimire,  Werner Lean, MD  alum & mag hydroxide-simeth (MAALOX/MYLANTA) 200-200-20 MG/5ML suspension Take 30 mLs by mouth every 6 (six) hours as needed for indigestion or heartburn. 09/21/22   Pokhrel, Rebekah Chesterfield, MD  amiodarone (PACERONE) 200 MG tablet 200 mg twice daily x 1 week, then switch to 200 mg daily Patient taking differently: Take 200 mg by mouth daily. 07/23/22   Ghimire, Werner Lean, MD  carvedilol (COREG) 12.5 MG tablet Take 1 tablet (12.5 mg total) by mouth 2 (two) times daily with a meal. 09/21/22   Pokhrel, Rebekah Chesterfield, MD  chlorhexidine (PERIDEX) 0.12 % solution Use as directed 15 mLs in the mouth or throat 2 (two) times daily. 09/21/22   Pokhrel, Rebekah Chesterfield, MD  DULoxetine (CYMBALTA) 30 MG capsule Take 2 capsules (60 mg total) by mouth at bedtime. 07/23/22   Ghimire, Werner Lean, MD  ferrous sulfate 325 (65 FE) MG tablet Take 325 mg by mouth daily. 05/11/21   [provider]  fluticasone (FLONASE) 50 MCG/ACT nasal spray Place 2 sprays into both nostrils in the morning, at noon, and at bedtime.    [provider]  folic acid (FOLVITE) 1 MG tablet Take 1 tablet (1 mg total) by mouth daily. 09/21/22   Pokhrel, Rebekah Chesterfield, MD  HYDROcodone-acetaminophen (NORCO) 5-325 MG tablet Take 1 tablet by mouth every 6 (six) hours as needed for moderate pain. 11/24/22   Small, Brooke L, PA  lidocaine (XYLOCAINE) 2 % solution Use as directed 15 mLs in the mouth or throat as needed for mouth pain. 11/24/22   Small, Brooke L, PA  lipase/protease/amylase (CREON) 36000 UNITS CPEP capsule Take 2  capsules (72,000 Units total) by mouth 3 (three) times daily with meals. May also take 1 capsule (36,000 Units total) as needed (with snacks). 09/21/22   Pokhrel, Rebekah Chesterfield, MD  loperamide (IMODIUM) 2 MG capsule Take 1 capsule (2 mg total) by mouth 4 (four) times daily as needed for diarrhea or loose stools. Patient taking differently: Take 4 mg by mouth as needed for diarrhea or loose stools. 07/23/22   Ghimire, Werner Lean, MD  Multiple  Vitamin (MULTIVITAMIN WITH MINERALS) TABS tablet Take 1 tablet by mouth daily. Patient taking differently: Take 2 tablets by mouth daily. 07/23/22   Ghimire, Werner Lean, MD  nicotine (NICODERM CQ - DOSED IN MG/24 HOURS) 21 mg/24hr patch Place 1 patch (21 mg total) onto the skin at bedtime. Patient not taking: Reported on 09/19/2022 07/23/22   Maretta Bees, MD  nitroGLYCERIN (NITROSTAT) 0.4 MG SL tablet Place 0.4 mg under the tongue every 5 (five) minutes as needed for chest pain.    [provider]  ondansetron (ZOFRAN-ODT) 4 MG disintegrating tablet Take 1 tablet (4 mg total) by mouth 2 (two) times daily as needed for nausea or vomiting. 09/21/22   Pokhrel, Rebekah Chesterfield, MD  pantoprazole (PROTONIX) 40 MG tablet Take 1 tablet (40 mg total) by mouth daily. 11/15/22   Gerhard Munch, MD  promethazine (PHENERGAN) 25 MG tablet Take 25 mg by mouth as needed for nausea or vomiting. 06/30/22   [provider]  sucralfate (CARAFATE) 1 GM/10ML suspension Take 10 mLs (1 g total) by mouth 4 (four) times daily -  with meals and at bedtime. Patient taking differently: Take 1 g by mouth See admin instructions. Take 4 times with meals and at bedtime. 09/14/22   [name], Willaim Rayas, MD  thiamine (VITAMIN B1) 100 MG tablet Take 1 tablet (100 mg total) by mouth daily. 07/23/22   Ghimire, Werner Lean, MD  Vitamin D, Ergocalciferol, (DRISDOL) 1.25 MG (50000 UNIT) CAPS capsule Take 1 capsule (50,000 Units total) by mouth every Wednesday. Patient taking differently: Take 50,000 Units by mouth once a week. 07/27/22   Ghimire, Werner Lean, MD  amitriptyline (ELAVIL) 25 MG tablet Take 1 tablet (25 mg total) by mouth at bedtime. Patient not taking: Reported on 08/08/2019 10/15/18 08/08/19  Rolly Salter, MD      Allergies    Robaxin [methocarbamol], Aspirin, Shellfish-derived products, Trazodone and nefazodone, Adhesive [tape], Latex, Toradol [ketorolac tromethamine], Contrast media [iodinated contrast media], Reglan  [metoclopramide], and Salmon [fish oil]    Review of Systems   Review of Systems  Cardiovascular:  Positive for chest pain.  All other systems reviewed and are negative.   Physical Exam Updated Vital Signs BP (!) 162/104   Pulse 97   Temp 98.9 F (37.2 C)   Resp 16   SpO2 100%  Physical Exam Vitals and nursing note reviewed.  Constitutional:      General: He is not in acute distress.    Appearance: He is well-developed.  HENT:     Head: Normocephalic and atraumatic.  Eyes:     Conjunctiva/sclera: Conjunctivae normal.  Cardiovascular:     Rate and Rhythm: Normal rate and regular rhythm.     Heart sounds: No murmur heard. Pulmonary:     Effort: Pulmonary effort is normal. No respiratory distress.     Breath sounds: Normal breath sounds. No wheezing, rhonchi or rales.  Abdominal:     Palpations: Abdomen is soft.     Tenderness: There is abdominal tenderness. There is no right  CVA tenderness or left CVA tenderness.     Comments: Mild epigastric tender  Musculoskeletal:        General: No swelling.     Cervical back: Neck supple.     Right lower leg: No edema.     Left lower leg: No edema.  Skin:    General: Skin is warm and dry.     Capillary Refill: Capillary refill takes less than 2 seconds.  Neurological:     Mental Status: He is alert.  Psychiatric:        Mood and Affect: Mood normal.     ED Results / Procedures / Treatments   Labs (all labs ordered are listed, but only abnormal results are displayed) Labs Reviewed  CBC  BASIC METABOLIC PANEL  TROPONIN I (HIGH SENSITIVITY)    EKG None  Radiology No results found.  Procedures Procedures  {Document cardiac monitor, telemetry assessment procedure when appropriate:1}  Medications Ordered in ED Medications - No data to display  ED Course/ Medical Decision Making/ A&P   {   Click here for ABCD2, HEART and other calculatorsREFRESH Note before signing :1}                          Medical Decision  Making Amount and/or Complexity of Data Reviewed Labs: ordered. Radiology: ordered.  Risk Prescription drug management.   This patient presents to the ED for concern of ***, this involves an extensive number of treatment options, and is a complaint that carries with it a high risk of complications and morbidity.  The differential diagnosis includes ***   Co morbidities that complicate the patient evaluation  See HPI   Additional history obtained:  Additional history obtained from EMR External records from outside source obtained and reviewed including ***   Lab Tests:  I Ordered, and personally interpreted labs.  The pertinent results include:  ***   Imaging Studies ordered:  I ordered imaging studies including ***  I independently visualized and interpreted imaging which showed *** I agree with the radiologist interpretation   Cardiac Monitoring: / EKG:  The patient was maintained on a cardiac monitor.  I personally viewed and interpreted the cardiac monitored which showed an underlying rhythm of: ***   Consultations Obtained:  I requested consultation with the ***,  and discussed lab and imaging findings as well as pertinent plan - they recommend: ***   Problem List / ED Course / Critical interventions / Medication management  *** I ordered medication including ***  for ***  Reevaluation of the patient after these medicines showed that the patient {resolved/improved/worsened:23923::"improved"} I have reviewed the patients home medicines and have made adjustments as needed   Social Determinants of Health:  ***   Test / Admission - Considered:  Vitals signs significant for ***. Otherwise within normal range and stable throughout visit. Laboratory/imaging studies significant for: *** *** Worrisome signs and symptoms were discussed with the patient, and the patient acknowledged understanding to return to the ED if noticed. Patient was stable upon  discharge.    {Document critical care time when appropriate:1} {Document review of labs and clinical decision tools ie heart score, Chads2Vasc2 etc:1}  {Document your independent review of radiology images, and any outside records:1} {Document your discussion with family members, caretakers, and with consultants:1} {Document social determinants of health affecting pt's care:1} {Document your decision making why or why not admission, treatments were needed:1} Final Clinical Impression(s) / ED Diagnoses Final diagnoses:  None    Rx / DC Orders ED Discharge Orders     None

## 2022-11-28 NOTE — ED Notes (Signed)
Patient transported to CT 

## 2022-11-28 NOTE — ED Triage Notes (Signed)
Pt BIBEMS w cc of CP, non-radiating that started this morning after argument with gf. Pt states he has hx of CP episodes and that he feels today was exacerbated by the argument with gf. Pt states he took med at 7am for ED and cannot take nitroglycerin or aspirin. Pt states pain is 8/10 in mid chest. Denies any other complaints.   164/112 110 HR 100%RA

## 2022-11-28 NOTE — ED Notes (Signed)
Patient transported to X-ray 

## 2022-11-28 NOTE — ED Provider Notes (Signed)
Physical Exam  BP (!) 154/78 (BP Location: Right Arm)   Pulse 84   Temp 98.1 F (36.7 C) (Oral)   Resp 18   SpO2 99%   Physical Exam Vitals and nursing note reviewed.  Constitutional:      General: He is not in acute distress.    Appearance: He is well-developed.  HENT:     Head: Normocephalic and atraumatic.  Eyes:     Conjunctiva/sclera: Conjunctivae normal.  Cardiovascular:     Rate and Rhythm: Normal rate and regular rhythm.     Heart sounds: No murmur heard. Pulmonary:     Effort: Pulmonary effort is normal. No respiratory distress.     Breath sounds: Normal breath sounds.  Abdominal:     General: Abdomen is flat. Bowel sounds are normal. There is no distension.     Palpations: Abdomen is soft.     Tenderness: There is abdominal tenderness. There is no guarding.     Comments: Mild epigastric tenderness to palpation  Musculoskeletal:        General: No swelling.     Cervical back: Neck supple.  Skin:    General: Skin is warm and dry.     Capillary Refill: Capillary refill takes less than 2 seconds.  Neurological:     Mental Status: He is alert.  Psychiatric:        Mood and Affect: Mood normal.     Procedures  Procedures  ED Course / MDM    Medical Decision Making Amount and/or Complexity of Data Reviewed Labs: ordered. Radiology: ordered.  Risk OTC drugs. Prescription drug management.   52 y.o. male with pertinent past medical history of  WPW, alcohol use disorder, cocaine use disorder, chronic pancreatitis presents to the ED for concern of epigastric pain and diarrhea that started this morning  Differential diagnosis includes but is not limited to acute pancreatitis, chronic pancreatitis, cholecystitis, diverticulitis, gastroenteritis  ED Course:  Patient resting comfortably in bed in no acute distress. Moving without pain.  Vital signs within normal limit except for a slightly elevated blood pressure 154/78.  Upon examination he has slight  tenderness to palpation in the epigastric region.  He has chronic pancreatitis and his CT scan shows changes consistent with chronic pancreatitis.  No acute abnormalities, no elevation in lipase or LFTs.  Has no fever or chills here.  He was seen recently in the ER on 7/18 and given Norco, will not refill this medication at this time due to recent fill and opioid use disorder.  He may use Tylenol and viscous lidocaine as needed for pain.  He was instructed to follow-up with his pain management team for ongoing pain control.  Instructed to avoid fatty foods to help with his diarrhea.   Impression: Chronic pancreatitis  Disposition:  The patient was discharged home with instructions to keep hydrated, avoid fatty foods, instructed on nonopioid pain control methods.  Instructed to follow-up with his pain management team for further pain control. Return precautions given.  Lab Tests: I Ordered, and personally interpreted labs.  The pertinent results include:   CMP only significant for a slightly low calcium of 8.4, no elevation in LFTs Lipase within normal limits CBC with no leukocytosis, hemoglobin low but at patient baseline Initial and repeat troponin of 5  Imaging Studies ordered: I ordered imaging studies including CT abdomen pelvis, chest x-ray I independently visualized the imaging with scope of interpretation limited to determining acute life threatening conditions related to emergency care.  Imaging showed  Calcifications within the pancreatic head and body consistent with chronic pancreatitis.  Stable fluid collection in the pancreatic body consistent with pseudocyst Chest x-ray with no consolidations, pneumothorax or effusions I agree with the radiologist interpretation    Co morbidities that complicate the patient evaluation   WPW, alcohol use disorder, cocaine use disorder, chronic pancreatitis  Social Determinants of Health:  Alcohol and cocaine use disorder              Arabella Merles, Cordelia Poche 11/28/22 1827    Terrilee Files, MD 11/29/22 1104

## 2022-11-29 ENCOUNTER — Other Ambulatory Visit: Payer: Self-pay

## 2022-11-29 ENCOUNTER — Emergency Department (HOSPITAL_COMMUNITY)
Admission: EM | Admit: 2022-11-29 | Discharge: 2022-11-29 | Disposition: A | Discharge status: Home / Self Care | Payer: MEDICAID | Attending: Emergency Medicine | Admitting: Emergency Medicine

## 2022-11-29 ENCOUNTER — Encounter (HOSPITAL_COMMUNITY): Payer: Self-pay | Admitting: Emergency Medicine

## 2022-11-29 DIAGNOSIS — Z91040 Latex allergy status: Secondary | ICD-10-CM | POA: Insufficient documentation

## 2022-11-29 DIAGNOSIS — I10 Essential (primary) hypertension: Secondary | ICD-10-CM | POA: Diagnosis not present

## 2022-11-29 DIAGNOSIS — K861 Other chronic pancreatitis: Secondary | ICD-10-CM | POA: Insufficient documentation

## 2022-11-29 DIAGNOSIS — Z79899 Other long term (current) drug therapy: Secondary | ICD-10-CM | POA: Diagnosis not present

## 2022-11-29 DIAGNOSIS — R109 Unspecified abdominal pain: Secondary | ICD-10-CM | POA: Diagnosis present

## 2022-11-29 MED ORDER — DROPERIDOL 2.5 MG/ML IJ SOLN
2.5000 mg | Freq: Once | INTRAMUSCULAR | Status: AC
  Administered 2022-11-29: 2.5 mg via INTRAVENOUS
  Filled 2022-11-29: qty 2

## 2022-11-29 NOTE — Progress Notes (Unsigned)
11/30/2022 [name] [id number] Dec 13, 1970  Referring provider: No ref. provider found Primary GI doctor: Dr. Adela Lank  ASSESSMENT AND PLAN:   Alcohol-induced chronic pancreatitis (HCC) in setting of continuing alcohol use, cigarette smoking-patient  has been in the ER every month with worsening epigastric pain. Patient with prior history of ulcers on EGD 2021, most recent endoscopy 2022 showed 2 cm hiatal hernia, gastritis but no ulcers Was taking Excedrin for 3 days, has been out of Protonix for 3 days. -Most likely epigastric pain is from chronic pancreatitis, had been on gabapentin and Cymbalta without help, will add on Lyrica as this has been shown in studies to be more pertinent for pancreatitis.  It is nongeneric. Will refill oxycodone for patient for his chronic pancreatitis, has follow-up next week with pain management which encourage patient to keep, only 5 days worth given, reviewed PDMP. -Long discussion with patient in regards to cessation of alcohol and smoking and he is talking but doing an outpatient program which I encouraged him to do. -Epigastric pain could also be potentially from ulcer/GERD, will refill pantoprazole 40 mg twice daily and add Carafate QID.   -Has had some worsening anemia, will recheck hemoglobin today, ER precautions discussed, may need repeat EGD.  Pancreatic pseudocyst MRCP 11/22/2019 chronic pancreatitis duct dilation and intra parenchymal pseudocyst EUS 2021 medium sized thick walled cystic lesion status post aspiration.  Cytology negative for CEA amylase, lipase. January Ct showed 2 cm cystic mass distal pancreatic body, most recently 11/28/2022 was 3.3 cm Discussed again alcohol cessation, will repeat MRCP to evaluate cyst further, consider EUS. Will also get Ca19 9.  Alcohol abuse -Alcohol Abstinence counseling discussed with patient as continued use is strongly associated with worsening liver disease progression - get on  MVIT - resources given, discuss with PCP  Chronic diarrhea but worsening in the last 7 days with associated nausea vomiting abdominal pain. Also had some weight loss. States Imodium is not responsive. Has been taking Creon. Refilled Imodium, Creon, will get C. difficile and stool culture samples. Potentially from alcohol/fatty foods, consider colestipol.  Difficult chronic patient, further recommendations per Dr. Adela Lank. Had very frank conversation with patient if he continues to drink he will continue to have worsening pain and complications, and also with pain management will likely be less likely to be able to get pain medications for his chronic pancreatitis.   Patient Care Team: Default, Provider, MD as PCP - General Rollene Rotunda, MD as PCP - Cardiology (Cardiology) [name], Chales Abrahams, NP Arlean Hopping, MD as Rounding Team (Internal Medicine) Duke, Roe Rutherford, PA as Physician Assistant (Cardiology)  HISTORY OF PRESENT ILLNESS: 52 y.o. male with a past medical history of chronic pancreatitis, chronic abdominal pain, history of peptic ulcer disease, history of alcohol and tobacco use and others listed below presents for evaluation of AB pain, chronic pancreatitis.   03/15/2022 office visit with Dr. Adela Lank for alcohol induced chronic pancreatitis, was complaining of worsening abdominal discomfort at that time, started back on Cymbalta 30 mg daily to increase to 60 mg, added dicyclomine 10 mg twice daily, continued Creon, advised to stop alcohol and tobacco use, referred to behavioral health increase Protonix to twice daily and added on Carafate with Zofran.    11/28/2022 CT abdomen pelvis without contrast for acute pancreatitis/pain showed stable calcifications within the pancreatic head and body consistent with chronic pancreatitis, grossly stable 3.3 cm fluid collection in pancreatic body consistent with pseudocyst liver normal, gallbladder unremarkable  Patient has  had CT abdomen pelvis without contrast every month of 2024 so far other than February.  For abdominal pain, January 2 cm cystic mass distal pancreatic body, most recently 11/2020 3.3 cm. Last MRCP and EUS/2021 status post cyst aspiration which was unremarkable. 09/18/2022 CT AB for ab pain showed Changes of chronic pancreatitis with calcification. Midbody cystic lesion is noted in the pancreas stable in appearance from the priorn exam. Nonemergent MRI may be helpful for further evaluation.Gastric wall thickening with findings suspicious for ulceration. No perforation is noted.  He states he has been having AB pain, nausea and vomiting and diarrhea started about 7 days ago after highschool reunion, had fried fish/chicken with hot sauce, denies drinking at that time or since that time.  He has had epigastric pain, associated nausea/vomiting, could not hold food.  He also has had diarrhea all day, having to wear diapers at night, having diarrhea 3 an hour.  He is still on carafate and creon, takes with every meal.  He has noticed black stools on 07/18, has transitioned to dark red blood that transitioned to bright red blood in stool last night, in the water, no large  Was in the ER 07/18 and again 07/22, given hydrocodone #5 on the 18th, 11/05/22 given 5, 05/15 given 15. Referred to pain management but he has been having a hard time getting pain medications and being able to see somebody.  He is out of pantoprazole for 3 days, has been on viscuous lidocaine for sore throat and having some pain with swallowing.  He has not had a drink in a week. He has not been able to smoke.  He was taking execedrin 3-4 days ago, did not know if had NSAIDS in it, he took 2 every 6 hours for 3 days.   11/28/2022  HGB 10.2 MCV 88.3 Platelets 260 12/18/2021 Iron 75 Ferritin 314 B12 527   He  reports that he has been smoking cigarettes and e-cigarettes. He has a 36 pack-year smoking history. He has never used smokeless  tobacco. He reports that he does not currently use alcohol. He reports that he does not currently use drugs after having used the following drugs: Marijuana and Cocaine.  RELEVANT LABS AND IMAGING:  EGD 11/13/19 -  - Multiple small white plaques were found in the upper third of the esophagus, grossly consistent with esophageal candidiasis. - The exam of the esophagus was otherwise normal. - Patchy mild inflammation characterized by erythema was found in the gastric body and in the gastric antrum. - The exam of the stomach was otherwise normal. - Biopsies were taken with a cold forceps in the gastric body, at the incisura and in the gastric antrum for Helicobacter pylori testing. - One non-bleeding cratered duodenal ulcer with no stigmata of bleeding was found in the distal duodenal bulb (entering first portion duodenal sweep), surrounded by some nodular inflammatory appearing mucosa. The lesion was 4 mm in largest dimension. Biopsies were taken with a cold forceps for histology from the periphery of it / nodular mucosa. - Patchy inflammation characterized by erosions was found in the duodenal bulb. - The exam of the duodenum was otherwise normal.   Colonoscopy 11/13/19 - The perianal and digital rectal examinations were normal. - The terminal ileum appeared normal. - Two sessile polyps were found in the transverse colon. The polyps were 3 to 7 mm in size. These polyps were removed with a cold snare. Resection and retrieval were complete. - A 3 to 4 mm polyp  was found in the sigmoid colon. The polyp was sessile. The polyp was removed with a cold snare. Resection and retrieval were complete. - Internal hemorrhoids were found during retroflexion. The hemorrhoids were small. - The exam was otherwise without abnormality. There was some residual gum / seeds in the cecum which were lavaged / washed and no polyps / lesions noted in the cecum. - Biopsies for histology were taken with a cold forceps  from the right colon, left colon and transverse colon for evaluation of microscopic colitis.   1. Surgical [P], duodenal ulcer - PEPTIC DUODENITIS. EVIDENCE OF HEALING EROSION/ULCER. - WARTHIN-STARRY STAIN IS NEGATIVE FOR HELICOBACTER PYLORI. 2. Surgical [P], gastric antrum and gastric body - MILD CHRONIC GASTRITIS. MILD REACTION GASTROPATHY. - WARTHIN-STARRY STAIN IS NEGATIVE FOR HELICOBACTER PYLORI. 3. Surgical [P], colon, transverse, sigmoid, polyp (3) - TUBULAR ADENOMA, NEGATIVE FOR HIGH GRADE DYSPLASIA (X1). -HYPERPLASTIC POLYP (X2). 4. Surgical [P], colon, random sites - NO SIGNIFICANT PATHOLOGIC FINDINGS. - NEGATIVE FOR ACTIVE INFLAMMATION AND OTHER ABNORMALITIES.     MRCP 11/22/19 - IMPRESSION: 1. Findings of chronic pancreatitis with decreased and new/increased cystic lesions throughout compared to 08/08/2019 CT (given difficulty in cross modality comparison). These are likely a combination of foci of duct dilatation and intraparenchymal pseudocysts. No evidence of acute pancreatitis. 2. No biliary duct dilatation. 3.  Possible constipation. 4. Chronic gastric wall thickening, again suspicious for gastritis. 5.  Aortic Atherosclerosis (ICD10-I70.0).   Gastrin level 11/26/19 - 1635   Chromograinin A level 12/18/19 - 1296   EUS 01/02/20 - Medium sized (4.8cm) thick walled cystic lesion in the body of the pancreas. This was nearly completley aspirated, yielding 120cc of brownish, old blood tinged fluid with solid flecks of tissue which was sent for cytology, CEA and amylase. - Following cyst aspiration the pancreatic parenchyma showed pretty clear signs of chronic pancreatitis but no obvious mass lesions (except to the residual 'rind' from the cyst aspiration above). The changes of chronic pancreatitis signficantly limit the sensitivity of EUS (and other imaging modalities) to detect neolasm. - The previously noted duodenal bulb ulcer is much improved in the interim.    Cytology  Negative Lipase / amylase level not returned      EGD 10/25/20: The Z-line was regular and was found 43 cm from the incisors. A 2 cm hiatal hernia was present. Patchy mildly erythematous mucosa without bleeding was found in the gastric body and in the gastric antrum - previously biopsied within the last 10 months so not rebiopsied. No other gross lesions were noted in the entire examined stomach. No gross lesions were noted in the duodenal bulb, in the first portion of the duodenum and in the second portion of the duodenum.   Flexible sig 10/25/20: A large amount of semi-liquid semi-solid stool was found in the entire colon, interfering with visualization. Lavage of the area was performed using copious amounts, resulting in clearance with fair visualization. A single (solitary) ten mm ulcer was found in the distal rectum. Oozing was present. It is not clear to me that this is truly a stercoral ulcer vs potential ulcer from recent enemas for today's procedure. I favor this to be from the enemas, but it needed to be treated. For hemostasis, three hemostatic clips were successfully placed (MR conditional). There was no bleeding at the end of the procedure. Non-bleeding non-thrombosed internal hemorrhoids were found during retroflexion, during perianal exam and during digital exam. The hemorrhoids were Grade II (internal hemorrhoids that prolapse but reduce spontaneously).  Echo 02/16/22: EF 65-70%     RUQ [place] 02/17/22: IMPRESSION: Negative right upper quadrant ultrasound   CT abdomen / pelvis without contrast 01/20/22: IMPRESSION: 1. No acute intra-abdominal or pelvic pathology. No bowel obstruction. Normal appendix. 2. Sequela of chronic pancreatitis. 3. Aortic Atherosclerosis (ICD10-I70.0).   MRCP 12/22/21: IMPRESSION: 1. No evidence acute pancreatitis. Chronic cysts within the pancreatic body are improved from MRI 2021. No new fluid collections. 2. Normal  biliary tree.  No cholelithiasis. 3. No enhancing hepatic lesion. 4. Some limitation of exam due to patient being uncooperative.     CT scan abdomen / pelvis 12/11/21: IMPRESSION: 1. Slight indistinctness of peripancreatic fat planes may represent acute pancreatitis. 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 No evidence of new pancreatic collection. 2. Sequela of chronic pancreatitis with pancreatic calcifications. 3. Chronic gastric thickening, unchanged from prior exam. 4. Minimal left colonic diverticulosis without diverticulitis.   CT abdomen / pelvis 11/02/21: IMPRESSION: 1. Coarse calcifications consistent with chronic calcific pancreatitis. Questionable acute on chronic pancreatitis with trace stranding at the anterior head of the pancreas versus fat scarring from old pancreatitis. 2. 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 3. Gastroenteritis, constipation, diverticulosis and likely undigested medication in the small and large bowel. 4. Aortic atherosclerosis.     CT abdomen / pelvis 08/14/21 IMPRESSION: 1. Interval development of moderate right and small left pleural effusions with bibasilar atelectasis. 2. Sequelae of chronic pancreatitis. 3. Small lesions in the pancreas mm hypoattenuating are unchanged and may reflect chronic pseudocysts. 4. Aortic Atherosclerosis (ICD10-I70.0).     CT scan abdomen  / pelvis 06/06/21: IMPRESSION: 1. Mild acute pancreatitis. 2. 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 3. Air-fluid levels throughout small bowel loops. Findings are nonspecific, but can be seen with enteritis. CBC    Component Value Date/Time   WBC 7.1 11/28/2022 1025   RBC 3.59 (L) 11/28/2022 1025   HGB 10.2 (L) 11/28/2022 1025   HGB 11.9 (L) 05/22/2020  1451   HCT 31.7 (L) 11/28/2022 1025   HCT 36.5 (L) 05/22/2020 1451   PLT 260 11/28/2022 1025   PLT 212 05/22/2020 1451   MCV 88.3 11/28/2022 1025   MCV 100 (H) 05/22/2020 1451   MCH 28.4 11/28/2022 1025   MCHC 32.2 11/28/2022 1025   RDW 17.1 (H) 11/28/2022 1025   RDW 13.1 05/22/2020 1451   LYMPHSABS 1.7 11/24/2022 1416   LYMPHSABS 2.4 05/22/2020 1451   MONOABS 0.9 11/24/2022 1416   EOSABS 0.2 11/24/2022 1416   EOSABS 0.1 05/22/2020 1451   BASOSABS 0.0 11/24/2022 1416   BASOSABS 0.0 05/22/2020 1451   Recent Labs    09/01/22 1710 09/18/22 1552 09/18/22 1718 09/19/22 0109 09/21/22 0630 11/04/22 2259 11/15/22 0035 11/15/22 0950 11/24/22 1416 11/28/22 1025  HGB 11.7* 13.5 16.7 11.3* 10.0* 12.9* 8.8* 10.4* 9.7* 10.2*    CMP     Component Value Date/Time   NA 138 11/28/2022 1015   NA 138 05/22/2020 1451   K 3.6 11/28/2022 1015   CL 104 11/28/2022 1015   CO2 23 11/28/2022 1015   GLUCOSE 75 11/28/2022 1015   BUN 9 11/28/2022 1015   BUN 14 05/22/2020 1451   CREATININE 0.90 11/28/2022 1015   CREATININE 0.65 11/08/2012 1226   CALCIUM 8.4 (L) 11/28/2022 1015   PROT 6.5 11/28/2022 1015   PROT 7.5 05/22/2020  1451   ALBUMIN 3.4 (L) 11/28/2022 1015   ALBUMIN 4.7 05/22/2020 1451   AST 33 11/28/2022 1015   ALT 18 11/28/2022 1015   ALKPHOS 72 11/28/2022 1015   BILITOT 0.2 (L) 11/28/2022 1015   BILITOT 0.3 05/22/2020 1451   GFRNONAA >60 11/28/2022 1015   GFRNONAA >89 11/08/2012 1226   GFRAA 126 05/22/2020 1451   GFRAA >89 11/08/2012 1226      Latest Ref Rng & Units 11/28/2022   10:15 AM 11/24/2022    2:16 PM 11/05/2022    1:44 AM  Hepatic Function  Total Protein 6.5 - 8.1 g/dL 6.5  6.9  7.9   Albumin 3.5 - 5.0 g/dL 3.4  3.7  4.0   AST 15 - 41 U/L 33  30  44   ALT 0 - 44 U/L 18  23  35   Alk Phosphatase 38 - 126 U/L 72  80  88   Total Bilirubin 0.3 - 1.2 mg/dL 0.2  0.4  0.5   Bilirubin, Direct 0.0 - 0.2 mg/dL   0.2       Current Medications:    Current  Outpatient Medications (Cardiovascular):    amiodarone (PACERONE) 200 MG tablet, 200 mg twice daily x 1 week, then switch to 200 mg daily (Patient taking differently: Take 200 mg by mouth daily.)   carvedilol (COREG) 12.5 MG tablet, Take 1 tablet (12.5 mg total) by mouth 2 (two) times daily with a meal.   metoprolol tartrate (LOPRESSOR) 100 MG tablet, Take 100 mg by mouth 2 (two) times daily.   nitroGLYCERIN (NITROSTAT) 0.4 MG SL tablet, Place 0.4 mg under the tongue every 5 (five) minutes as needed for chest pain.  Current Outpatient Medications (Respiratory):    albuterol (VENTOLIN HFA) 108 (90 Base) MCG/ACT inhaler, Inhale 2 puffs into the lungs every 4 (four) hours as needed for wheezing or shortness of breath.   fluticasone (FLONASE) 50 MCG/ACT nasal spray, Place 2 sprays into both nostrils in the morning, at noon, and at bedtime.   promethazine (PHENERGAN) 25 MG tablet, Take 25 mg by mouth as needed for nausea or vomiting.  Current Outpatient Medications (Analgesics):    oxyCODONE-acetaminophen (PERCOCET/ROXICET) 5-325 MG tablet, Take 1 tablet by mouth every 6 (six) hours as needed for up to 5 days for severe pain.  Current Outpatient Medications (Hematological):    ferrous sulfate 325 (65 FE) MG tablet, Take 325 mg by mouth daily.   folic acid (FOLVITE) 1 MG tablet, Take 1 tablet (1 mg total) by mouth daily.  Current Outpatient Medications (Other):    chlorhexidine (PERIDEX) 0.12 % solution, Use as directed 15 mLs in the mouth or throat 2 (two) times daily.   lipase/protease/amylase (CREON) 36000 UNITS CPEP capsule, Take 2 capsules (72,000 Units total) by mouth 3 (three) times daily with meals. May also take 1 capsule (36,000 Units total) as needed (with snacks).   Multiple Vitamin (MULTIVITAMIN WITH MINERALS) TABS tablet, Take 1 tablet by mouth daily. (Patient taking differently: Take 2 tablets by mouth daily.)   nicotine (NICODERM CQ - DOSED IN MG/24 HOURS) 21 mg/24hr patch, Place 1  patch (21 mg total) onto the skin at bedtime.   ondansetron (ZOFRAN-ODT) 4 MG disintegrating tablet, Take 1 tablet (4 mg total) by mouth 2 (two) times daily as needed for nausea or vomiting.   pregabalin (LYRICA) 100 MG capsule, Take 1 capsule (100 mg total) by mouth 2 (two) times daily.   thiamine (VITAMIN B1) 100 MG tablet, Take  1 tablet (100 mg total) by mouth daily.   Vitamin D, Ergocalciferol, (DRISDOL) 1.25 MG (50000 UNIT) CAPS capsule, Take 1 capsule (50,000 Units total) by mouth every Wednesday. (Patient taking differently: Take 50,000 Units by mouth once a week.)   alum & mag hydroxide-simeth (MAALOX/MYLANTA) 200-200-20 MG/5ML suspension, Take 30 mLs by mouth every 6 (six) hours as needed for indigestion or heartburn.   lidocaine (XYLOCAINE) 2 % solution, Use as directed 15 mLs in the mouth or throat every 6 (six) hours as needed for mouth pain.   loperamide (IMODIUM) 2 MG capsule, Take 2 capsules (4 mg total) by mouth as needed for diarrhea or loose stools.   pantoprazole (PROTONIX) 40 MG tablet, Take 1 tablet (40 mg total) by mouth daily.   sucralfate (CARAFATE) 1 GM/10ML suspension, Take 10 mLs (1 g total) by mouth See admin instructions. Take 4 times with meals and at bedtime.  Medical History:  Past Medical History:  Diagnosis Date   Alcoholism /alcohol abuse    Anemia    Anxiety    Arthritis    knees; arms; elbows (03/26/2015)   Asthma    Bipolar disorder (HCC)    Chronic bronchitis (HCC)    Chronic lower back pain    Chronic pancreatitis (HCC)    Cocaine abuse (HCC)    Depression    Family history of adverse reaction to anesthesia    Femoral condyle fracture (HCC) 03/08/2014   left medial/notes 03/09/2014   GERD (gastroesophageal reflux disease)    H/O hiatal hernia    H/O suicide attempt 10/2012   High cholesterol    History of blood transfusion 10/2012   when I tried to commit suicide   History of stomach ulcers    Hypertension    Marijuana abuse, continuous     Migraine    a few times/year (03/26/2015)   Pneumonia 1990's X 3   PTSD (post-traumatic stress disorder)    Seizures (HCC)    Sickle cell trait (HCC)    WPW (Wolff-Parkinson-White syndrome)    [name] 03/06/2013   Allergies:  Allergies  Allergen Reactions   Robaxin [Methocarbamol] Other (See Comments)    jumpy limbs   Aspirin Other (See Comments)    Unknown reaction   Shellfish-Derived Products Nausea And Vomiting and Rash   Trazodone And Nefazodone Other (See Comments)    Muscle spasms   Adhesive [Tape] Itching   Latex Itching   Toradol [Ketorolac Tromethamine] Other (See Comments)    Has ulcers; cannot have this   Contrast Media [Iodinated Contrast Media] Hives   Reglan [Metoclopramide] Other (See Comments)    Muscle spasms   Salmon [Fish Oil] Nausea And Vomiting and Rash     Surgical History:  He  has a past surgical history that includes Facial fracture surgery (Left, 1990's); Eye surgery (Left, 1990's); left heart catheterization with coronary angiogram (Right, 03/07/2013); Cardiac catheterization; Fracture surgery; Umbilical hernia repair; Hernia repair; Esophagogastroduodenoscopy (egd) with propofol (N/A, 11/25/2017); biopsy (11/25/2017); Esophagogastroduodenoscopy (egd) with propofol (Left, 10/14/2018); biopsy (10/14/2018); Esophagogastroduodenoscopy (egd) with propofol (N/A, 11/14/2018); Upper gastrointestinal endoscopy; EUS (N/A, 01/02/2020); Esophagogastroduodenoscopy (egd) with propofol (N/A, 01/02/2020); Cyst enterostomy (01/02/2020); Esophagogastroduodenoscopy (egd) with propofol (N/A, 10/25/2020); Flexible sigmoidoscopy (N/A, 10/25/2020); and Hemostasis clip placement (10/25/2020). Family History:  His family history includes Alcoholism in his father; Cirrhosis in his father; Coronary artery disease in an other family member; Hypertension in his father, mother, and another family member; Melanoma in his father.  REVIEW OF SYSTEMS  : All other systems  reviewed and negative except  where noted in the History of Present Illness.  PHYSICAL EXAM: BP 126/88   Pulse 89   Ht 5\' 8"  (1.727 m)   Wt 121 lb (54.9 kg)   BMI 18.40 kg/m  General Appearance: Thin, malnourished appearing in no apparent distress. Head:   Normocephalic and atraumatic. Eyes:  sclerae anicteric,conjunctive pink  Respiratory: Respiratory effort normal, BS equal bilaterally without rales, rhonchi, wheezing. Cardio: RRR with no MRGs. Peripheral pulses intact.  Abdomen: Soft,  Flat ,active bowel sounds. moderate tenderness in the epigastrium. Without guarding and Without rebound. No masses. Rectal: Not evaluated Musculoskeletal: Full ROM, Normal gait. Without edema. Skin:  Dry and intact without significant lesions or rashes Neuro: Alert and  oriented x4;  No focal deficits. Psych:  Cooperative. Normal mood and affect.    Doree Albee, PA-C 11:15 AM

## 2022-11-29 NOTE — Discharge Instructions (Addendum)
You were evaluated today for abdominal pain which seems to be due to your chronic pain. It is important that you keep your appointment next week with pain management. If you develop any life threatening symptoms please return to the emergency department

## 2022-11-29 NOTE — ED Provider Notes (Signed)
[place] EMERGENCY DEPARTMENT AT [facility] Provider Note   CSN: [id number] Arrival date & time: 11/29/22  3875     History  Chief Complaint  Patient presents with   Chest Pain    [name] is a 52 y.o. male. Patient returns to the emergency department via EMS complaining of continued chronic abdominal pain.  He was evaluated earlier today for chest pain and abdominal pain at that time he had troponins x 2 which were negative, lipase within normal limits, and his CT abdomen pelvis showing signs of chronic pancreatitis with no acute or emergent features.  He was discharged with recommendations for continued follow-up with pain management.  He went home and states he has continued pain and does not understand why he was discharged.  He was administered 4 mg of morphine by EMS.  He has no new complaints at this time and is seeking continued pain control.  Pain upon arrival was rated 4/10 in severity.   Past medical history significant for hypertension, WPW post ablation, cocaine/marijuana/alcohol use/abuse, chronic bronchitis, chronic pancreatitis, chronic low back pain, GERD, sickle cell trait, anemia  HPI     Home Medications Prior to Admission medications   Medication Sig Start Date End Date Taking? Authorizing Provider  acetaminophen (TYLENOL) 650 MG CR tablet Take 1 tablet (650 mg total) by mouth every 8 (eight) hours as needed for pain. Patient not taking: Reported on 11/24/2022 07/23/22   Maretta Bees, MD  albuterol (VENTOLIN HFA) 108 (90 Base) MCG/ACT inhaler Inhale 2 puffs into the lungs every 4 (four) hours as needed for wheezing or shortness of breath. 07/23/22   Ghimire, Werner Lean, MD  alum & mag hydroxide-simeth (MAALOX/MYLANTA) 200-200-20 MG/5ML suspension Take 30 mLs by mouth every 6 (six) hours as needed for indigestion or heartburn. 09/21/22   Pokhrel, Rebekah Chesterfield, MD  amiodarone (PACERONE) 200 MG tablet 200 mg twice daily x 1 week, then switch to  200 mg daily Patient taking differently: Take 200 mg by mouth daily. 07/23/22   Ghimire, Werner Lean, MD  carvedilol (COREG) 12.5 MG tablet Take 1 tablet (12.5 mg total) by mouth 2 (two) times daily with a meal. 09/21/22   Pokhrel, Rebekah Chesterfield, MD  chlorhexidine (PERIDEX) 0.12 % solution Use as directed 15 mLs in the mouth or throat 2 (two) times daily. 09/21/22   Pokhrel, Rebekah Chesterfield, MD  DULoxetine (CYMBALTA) 30 MG capsule Take 2 capsules (60 mg total) by mouth at bedtime. 07/23/22   Ghimire, Werner Lean, MD  ferrous sulfate 325 (65 FE) MG tablet Take 325 mg by mouth daily. 05/11/21   [provider]  fluticasone (FLONASE) 50 MCG/ACT nasal spray Place 2 sprays into both nostrils in the morning, at noon, and at bedtime.    [provider]  folic acid (FOLVITE) 1 MG tablet Take 1 tablet (1 mg total) by mouth daily. 09/21/22   Pokhrel, Rebekah Chesterfield, MD  HYDROcodone-acetaminophen (NORCO) 5-325 MG tablet Take 1 tablet by mouth every 6 (six) hours as needed for moderate pain. 11/24/22   Small, Brooke L, PA  lidocaine (XYLOCAINE) 2 % solution Use as directed 15 mLs in the mouth or throat every 6 (six) hours as needed for mouth pain. 11/28/22   Arabella Merles, PA-C  lipase/protease/amylase (CREON) 36000 UNITS CPEP capsule Take 2 capsules (72,000 Units total) by mouth 3 (three) times daily with meals. May also take 1 capsule (36,000 Units total) as needed (with snacks). 09/21/22   Pokhrel, Rebekah Chesterfield, MD  loperamide (IMODIUM) 2  MG capsule Take 1 capsule (2 mg total) by mouth 4 (four) times daily as needed for diarrhea or loose stools. Patient taking differently: Take 4 mg by mouth as needed for diarrhea or loose stools. 07/23/22   Ghimire, Werner Lean, MD  Multiple Vitamin (MULTIVITAMIN WITH MINERALS) TABS tablet Take 1 tablet by mouth daily. Patient taking differently: Take 2 tablets by mouth daily. 07/23/22   Ghimire, Werner Lean, MD  nicotine (NICODERM CQ - DOSED IN MG/24 HOURS) 21 mg/24hr patch Place 1 patch (21 mg total)  onto the skin at bedtime. Patient not taking: Reported on 09/19/2022 07/23/22   Maretta Bees, MD  nitroGLYCERIN (NITROSTAT) 0.4 MG SL tablet Place 0.4 mg under the tongue every 5 (five) minutes as needed for chest pain.    [provider]  ondansetron (ZOFRAN-ODT) 4 MG disintegrating tablet Take 1 tablet (4 mg total) by mouth 2 (two) times daily as needed for nausea or vomiting. 09/21/22   Pokhrel, Rebekah Chesterfield, MD  pantoprazole (PROTONIX) 40 MG tablet Take 1 tablet (40 mg total) by mouth daily. 11/15/22   Gerhard Munch, MD  promethazine (PHENERGAN) 25 MG tablet Take 25 mg by mouth as needed for nausea or vomiting. 06/30/22   [provider]  sucralfate (CARAFATE) 1 GM/10ML suspension Take 10 mLs (1 g total) by mouth 4 (four) times daily -  with meals and at bedtime. Patient taking differently: Take 1 g by mouth See admin instructions. Take 4 times with meals and at bedtime. 09/14/22   [name], Willaim Rayas, MD  thiamine (VITAMIN B1) 100 MG tablet Take 1 tablet (100 mg total) by mouth daily. 07/23/22   Ghimire, Werner Lean, MD  Vitamin D, Ergocalciferol, (DRISDOL) 1.25 MG (50000 UNIT) CAPS capsule Take 1 capsule (50,000 Units total) by mouth every Wednesday. Patient taking differently: Take 50,000 Units by mouth once a week. 07/27/22   Ghimire, Werner Lean, MD  amitriptyline (ELAVIL) 25 MG tablet Take 1 tablet (25 mg total) by mouth at bedtime. Patient not taking: Reported on 08/08/2019 10/15/18 08/08/19  Rolly Salter, MD      Allergies    Robaxin [methocarbamol], Aspirin, Shellfish-derived products, Trazodone and nefazodone, Adhesive [tape], Latex, Toradol [ketorolac tromethamine], Contrast media [iodinated contrast media], Reglan [metoclopramide], and Salmon [fish oil]    Review of Systems   Review of Systems  Physical Exam Updated Vital Signs BP (!) 146/99   Pulse (!) 107   Temp 98.3 F (36.8 C)   Resp (!) 22   Ht 5\' 9"  (1.753 m)   Wt 61.2 kg   SpO2 97%   BMI 19.94 kg/m   Physical Exam Vitals and nursing note reviewed.  Constitutional:      General: He is not in acute distress.    Appearance: He is well-developed.  HENT:     Head: Normocephalic and atraumatic.  Eyes:     Conjunctiva/sclera: Conjunctivae normal.  Cardiovascular:     Rate and Rhythm: Normal rate and regular rhythm.     Heart sounds: No murmur heard. Pulmonary:     Effort: Pulmonary effort is normal. No respiratory distress.     Breath sounds: Normal breath sounds.  Abdominal:     General: Abdomen is flat. Bowel sounds are normal. There is no distension.     Palpations: Abdomen is soft.     Tenderness: There is abdominal tenderness. There is no guarding.     Comments: Mild epigastric tenderness to palpation  Musculoskeletal:        General:  No swelling.     Cervical back: Neck supple.  Skin:    General: Skin is warm and dry.     Capillary Refill: Capillary refill takes less than 2 seconds.  Neurological:     Mental Status: He is alert.  Psychiatric:        Mood and Affect: Mood normal.     ED Results / Procedures / Treatments   Labs (all labs ordered are listed, but only abnormal results are displayed) Labs Reviewed - No data to display  EKG None  Radiology CT ABDOMEN PELVIS WO CONTRAST  Result Date: 11/28/2022 CLINICAL DATA:  Acute pancreatitis. EXAM: CT ABDOMEN AND PELVIS WITHOUT CONTRAST TECHNIQUE: Multidetector CT imaging of the abdomen and pelvis was performed following the standard protocol without IV contrast. RADIATION DOSE REDUCTION: This exam was performed according to the departmental dose-optimization program which includes automated exposure control, adjustment of the mA and/or kV according to patient size and/or use of iterative reconstruction technique. COMPARISON:  November 05, 2022. FINDINGS: Lower chest: No acute abnormality. Hepatobiliary: No focal liver abnormality is seen. No gallstones, gallbladder wall thickening, or biliary dilatation. Pancreas:  Calcifications are again noted in pancreatic head and body consistent with chronic pancreatitis. Grossly stable 3.3 cm fluid collection seen in pancreatic body consistent with pseudocyst. Spleen: Normal in size without focal abnormality. Adrenals/Urinary Tract: Adrenal glands are unremarkable. Kidneys are normal, without renal calculi, focal lesion, or hydronephrosis. Bladder is unremarkable. Stomach/Bowel: Stomach is within normal limits. Appendix appears normal. No evidence of bowel wall thickening, distention, or inflammatory changes. Vascular/Lymphatic: Aortic atherosclerosis. No enlarged abdominal or pelvic lymph nodes. Reproductive: Prostate is unremarkable. Other: No abdominal wall hernia or abnormality. No abdominopelvic ascites. Musculoskeletal: No acute or significant osseous findings. IMPRESSION: Table calcifications within pancreatic head and body consistent with chronic pancreatitis. Grossly stable 3.3 cm fluid collection is seen in pancreatic body consistent with pseudocyst as noted on prior exam. Electronically Signed   By: Lupita Raider M.D.   On: 11/28/2022 17:08   DG Chest 1 View  Result Date: 11/28/2022 CLINICAL DATA:  Chest pain EXAM: CHEST  1 VIEW COMPARISON:  X-ray 11/24/2022 and older FINDINGS: No consolidation, pneumothorax or effusion. Normal cardiopericardial silhouette without edema. IMPRESSION: No acute cardiopulmonary disease. Electronically Signed   By: Karen Kays M.D.   On: 11/28/2022 10:57    Procedures Procedures    Medications Ordered in ED Medications  droperidol (INAPSINE) 2.5 MG/ML injection 2.5 mg ( Intravenous Canceled Entry 11/29/22 0355)    ED Course/ Medical Decision Making/ A&P                             Medical Decision Making Risk Prescription drug management.   This patient presents to the ED for concern of chronic abdominal pain, this involves an extensive number of treatment options, and is a complaint that carries with it a high risk of  complications and morbidity.  The differential diagnosis includes chronic pain from pancreatitis, cholecystitis, appendicitis, others   Co morbidities that complicate the patient evaluation  Polysubstance abuse, chronic pancreatitis   Additional history obtained:  Additional history obtained from EMS External records from outside source obtained and reviewed including ED notes from earlier today, discharge summary from May 15 after admission for acute on chronic pancreatitis   Lab Tests:  I considered ordering labs with the patient has had no change in condition since earlier today.  Labs at that time included initial  and repeat troponins of 5, lipase 23, hemoglobin 10.2 improving over past few weeks   Imaging Studies ordered:  I reviewed the CT abdomen pelvis that was performed yesterday. Table calcifications within pancreatic head and body consistent with chronic pancreatitis. Grossly stable 3.3 cm fluid collection is seen in pancreatic body consistent with pseudocyst as noted on prior exam.   Cardiac Monitoring: / EKG:  The patient was maintained on a cardiac monitor.  I personally viewed and interpreted the cardiac monitored which showed an underlying rhythm of: Sinus rhythm   Problem List / ED Course / Critical interventions / Medication management   I ordered medication including droperidol for abdominal pain Reevaluation of the patient after these medicines showed that the patient improved I have reviewed the patients home medicines and have made adjustments as needed   Social Determinants of Health:  Patient with no current primary care provider   Test / Admission - Considered:  Patient states he has plans to initiate care with pain management next week.  Based on results from earlier visit today there is no new complaint.  I see no indication for repeating labs from less than 24 hours ago or to repeat imaging.  Patient with mild tenderness on palpation.  Doubt  cholecystitis, appendicitis.  Considered this may be drug-seeking behavior.  Patient requested Dilaudid, then requested morphine, but improved significantly with droperidol.  With history of polysubstance abuse do not feel that prescription for narcotics would be appropriate.  Plan to discharge home at this time with return precautions         Final Clinical Impression(s) / ED Diagnoses Final diagnoses:  Other chronic pancreatitis [facility])    Rx / DC Orders ED Discharge Orders     None         Pamala Duffel 11/29/22 0432    Palumbo, April, MD 11/29/22 [id number]

## 2022-11-29 NOTE — ED Triage Notes (Addendum)
Pt BIB EMS from home for CP, states he is here for abd pain and back pain from his pancreatitis. States he doesn't know why he was discharged. 4mg  of Morphine given by EMS.

## 2022-11-30 ENCOUNTER — Other Ambulatory Visit (INDEPENDENT_AMBULATORY_CARE_PROVIDER_SITE_OTHER): Payer: MEDICAID

## 2022-11-30 ENCOUNTER — Encounter: Payer: Self-pay | Admitting: Physician Assistant

## 2022-11-30 ENCOUNTER — Ambulatory Visit (INDEPENDENT_AMBULATORY_CARE_PROVIDER_SITE_OTHER): Payer: MEDICAID | Admitting: Physician Assistant

## 2022-11-30 VITALS — BP 126/88 | HR 89 | Ht 68.0 in | Wt 121.0 lb

## 2022-11-30 DIAGNOSIS — K219 Gastro-esophageal reflux disease without esophagitis: Secondary | ICD-10-CM

## 2022-11-30 DIAGNOSIS — F1010 Alcohol abuse, uncomplicated: Secondary | ICD-10-CM | POA: Diagnosis not present

## 2022-11-30 DIAGNOSIS — K863 Pseudocyst of pancreas: Secondary | ICD-10-CM

## 2022-11-30 DIAGNOSIS — K862 Cyst of pancreas: Secondary | ICD-10-CM

## 2022-11-30 DIAGNOSIS — K852 Alcohol induced acute pancreatitis without necrosis or infection: Secondary | ICD-10-CM

## 2022-11-30 DIAGNOSIS — Z8711 Personal history of peptic ulcer disease: Secondary | ICD-10-CM

## 2022-11-30 DIAGNOSIS — K860 Alcohol-induced chronic pancreatitis: Secondary | ICD-10-CM | POA: Diagnosis not present

## 2022-11-30 DIAGNOSIS — F17200 Nicotine dependence, unspecified, uncomplicated: Secondary | ICD-10-CM

## 2022-11-30 DIAGNOSIS — A09 Infectious gastroenteritis and colitis, unspecified: Secondary | ICD-10-CM

## 2022-11-30 DIAGNOSIS — K529 Noninfective gastroenteritis and colitis, unspecified: Secondary | ICD-10-CM

## 2022-11-30 LAB — COMPREHENSIVE METABOLIC PANEL
ALT: 25 U/L (ref 0–53)
AST: 41 U/L — ABNORMAL HIGH (ref 0–37)
Albumin: 4.1 g/dL (ref 3.5–5.2)
Alkaline Phosphatase: 84 U/L (ref 39–117)
BUN: 5 mg/dL — ABNORMAL LOW (ref 6–23)
CO2: 21 mEq/L (ref 19–32)
Calcium: 8.4 mg/dL (ref 8.4–10.5)
Chloride: 105 mEq/L (ref 96–112)
Creatinine, Ser: 0.71 mg/dL (ref 0.40–1.50)
GFR: 104.84 mL/min (ref >60.00)
Glucose, Bld: 90 mg/dL (ref 70–99)
Potassium: 3.5 mEq/L (ref 3.5–5.1)
Sodium: 137 mEq/L (ref 135–145)
Total Bilirubin: 0.4 mg/dL (ref 0.2–1.2)
Total Protein: 7.3 g/dL (ref 6.0–8.3)

## 2022-11-30 LAB — IBC + FERRITIN
Ferritin: 10.7 ng/mL — ABNORMAL LOW (ref 22.0–322.0)
Iron: 89 ug/dL (ref 42–165)
Saturation Ratios: 18.1 % — ABNORMAL LOW (ref 20.0–50.0)
TIBC: 492.8 ug/dL — ABNORMAL HIGH (ref 250.0–450.0)
Transferrin: 352 mg/dL (ref 212.0–360.0)

## 2022-11-30 LAB — CBC WITH DIFFERENTIAL/PLATELET
Basophils Absolute: 0 10*3/uL (ref 0.0–0.1)
Basophils Relative: 0.3 % (ref 0.0–3.0)
Eosinophils Absolute: 0.1 10*3/uL (ref 0.0–0.7)
Eosinophils Relative: 2 % (ref 0.0–5.0)
HCT: 35 % — ABNORMAL LOW (ref 39.0–52.0)
Hemoglobin: 10.7 g/dL — ABNORMAL LOW (ref 13.0–17.0)
Lymphocytes Relative: 27.6 % (ref 12.0–46.0)
Lymphs Abs: 2 10*3/uL (ref 0.7–4.0)
MCHC: 30.5 g/dL (ref 30.0–36.0)
MCV: 91.7 fl (ref 78.0–100.0)
Monocytes Absolute: 0.7 10*3/uL (ref 0.1–1.0)
Monocytes Relative: 9.9 % (ref 3.0–12.0)
Neutro Abs: 4.4 10*3/uL (ref 1.4–7.7)
Neutrophils Relative %: 60.2 % (ref 43.0–77.0)
Platelets: 215 10*3/uL (ref 150.0–400.0)
RBC: 3.81 Mil/uL — ABNORMAL LOW (ref 4.22–5.81)
RDW: 18 % — ABNORMAL HIGH (ref 11.5–15.5)
WBC: 7.2 10*3/uL (ref 4.0–10.5)

## 2022-11-30 MED ORDER — PREGABALIN 100 MG PO CAPS: 100.0000 mg | ORAL_CAPSULE | Freq: Two times a day (BID) | ORAL | 3 refills | Status: DC

## 2022-11-30 MED ORDER — PANTOPRAZOLE SODIUM 40 MG PO TBEC
40.0000 mg | DELAYED_RELEASE_TABLET | Freq: Every day | ORAL | 0 refills | Status: DC
Start: 2022-11-30 — End: 2023-01-11

## 2022-11-30 MED ORDER — LOPERAMIDE HCL 2 MG PO CAPS
4.0000 mg | ORAL_CAPSULE | ORAL | Status: DC | PRN
Start: 2022-11-30 — End: 2023-01-10

## 2022-11-30 MED ORDER — SUCRALFATE 1 GM/10ML PO SUSP
1.0000 g | ORAL | 4 refills | Status: DC
Start: 2022-11-30 — End: 2023-01-05

## 2022-11-30 MED ORDER — LIDOCAINE VISCOUS HCL 2 % MT SOLN
15.0000 mL | Freq: Four times a day (QID) | OROMUCOSAL | 0 refills | Status: DC | PRN
Start: 2022-11-30 — End: 2022-12-17

## 2022-11-30 MED ORDER — OXYCODONE-ACETAMINOPHEN 5-325 MG PO TABS
1.0000 | ORAL_TABLET | Freq: Four times a day (QID) | ORAL | 0 refills | Status: AC | PRN
Start: 2022-11-30 — End: 2022-12-05

## 2022-11-30 MED ORDER — ALUM & MAG HYDROXIDE-SIMETH 200-200-20 MG/5ML PO SUSP
30.0000 mL | Freq: Four times a day (QID) | ORAL | 0 refills | Status: DC | PRN
Start: 2022-11-30 — End: 2023-01-05

## 2022-11-30 NOTE — Progress Notes (Signed)
Agree with assessment and plan as outlined with the following thoughts:  He absolutely must stop drinking alcohol and using tobacco in light of these issues which will be very difficult to manage without that happening. If he needs to see behavioral health to help with this we can refer him for that. I have referred him to chronic pain management multiple times.  I will not prescribe chronic narcotics for this issue for him, especially if he continues to drink alcohol and smoke tobacco.  That can be managed by pain management clinic.  He just had a CT scan, I do not think we need to repeat an MRCP right now it would likely be low yield.  His pancreatic duct does not seem dilated on the CT scan. I do not know if he is a candidate for celiac plexus block, I will ask Dr. Meridee Score to get his opinion on that. Agree with trial of Lyrica in the interim. Agree with BID PPI and carafate. He has had a few EGDs for these symptoms, I think a repeat exam is likely low yield.

## 2022-11-30 NOTE — Patient Instructions (Signed)
Your provider has requested that you go to the basement level for lab work before leaving today. Press "B" on the elevator. The lab is located at the first door on the left as you exit the elevator.  You have been scheduled for an MRI at [facility], 1st floor, Radiology. Your appointment is scheduled on 12/09/2022 at 9:00AM. Please arrive 15 minutes prior to your appointment time for registration purposes. Please make certain not to have anything to eat or drink 6 hours prior to your test. In addition, if you have any metal in your body, have a pacemaker or defibrillator, please be sure to let your ordering physician know. This test typically takes 45 minutes to 1 hour to complete. Should you need to reschedule, please call [phone].   Take the lyrica once at night, can increase to twice a day Advised to go to the ER if there is any severe weakness, severe abdominal pain, vomit blood, dark red blood in your bowel movement, shortness of breath or chest pain.  Make sure you stay on the protonix 40 mg TWICE a dya before food   We have sent the following medications to your pharmacy for you to pick up at your convenience: Imodium  Protonix Lyrica  Carafate  Xylocaine   _______________________________________________________  If your blood pressure at your visit was 140/90 or greater, please contact your primary care physician to follow up on this.  _______________________________________________________  If you are age 51 or older, your body mass index should be between 23-30. Your Body mass index is 18.4 kg/m. If this is out of the aforementioned range listed, please consider follow up with your Primary Care Provider.  If you are age 9 or younger, your body mass index should be between 19-25. Your Body mass index is 18.4 kg/m. If this is out of the aformentioned range listed, please consider follow up with your Primary Care Provider.    ________________________________________________________  The [facility] providers would like to encourage you to use [facility] to communicate with providers for non-urgent requests or questions.  Due to long hold times on the telephone, sending your provider a message by [facility] may be a faster and more efficient way to get a response.  Please allow 48 business hours for a response.  Please remember that this is for non-urgent requests.  _______________________________________________________  It was a pleasure to see you today!  Thank you for trusting me with your gastrointestinal care!

## 2022-12-01 LAB — CANCER ANTIGEN 19-9: CA 19-9: 104 U/mL — ABNORMAL HIGH (ref <34)

## 2022-12-01 NOTE — Progress Notes (Signed)
I asked Dr. Meridee Score to review his case to see if he was a candidate for celiac block.  If the patient wants to entertain that he will offer him the exam.  This would also get a better look at the cyst although this appears to be likely pseudocyst.   Marchelle Folks can you let him know and see if he would want to pursue an EUS with possible celiac axis block?  Thanks

## 2022-12-02 ENCOUNTER — Telehealth: Payer: Self-pay | Admitting: Physician Assistant

## 2022-12-02 ENCOUNTER — Other Ambulatory Visit: Payer: Self-pay

## 2022-12-02 DIAGNOSIS — K860 Alcohol-induced chronic pancreatitis: Secondary | ICD-10-CM

## 2022-12-02 NOTE — Telephone Encounter (Signed)
-----   Message from Benancio Deeds sent at 12/01/2022  1:08 PM EDT ----- Thanks, appreciate taking a look Gabe.   Marchelle Folks we can offer him an EUS with celiac block if he wants to try it, he has failed other measures and is likely going to need chronic narcotics otherwise for pain management.  Can you have your nurse touch base with him to see if he wants to schedule and CC Patty if so? Thanks ----- Message ----- From: Lemar Lofty., MD Sent: 12/01/2022   8:56 AM EDT To: Doree Albee, PA-C; #  Team, Not opposed to trying a Celiac block. This cyst is coming/going so likely pseudocyst, but could consider sampling if necessary. If he is interested, then let me know. It's going to be a few weeks out. GM ----- Message ----- From: Benancio Deeds, MD Sent: 11/30/2022   5:45 PM EDT To: Doree Albee, PA-C; #   Gabe, very challenging patient with chronic pancreatitis, will not stop drinking alcohol or smoking.  I referred him to pain management.  He is on pancreatic enzymes.  Marchelle Folks, he just had a CT scan, I think the yield of a repeat MRCP right now is probably low, can consider this in upcoming months if he continues to worsen.  Liz Beach do think this patient is a candidate for celiac plexus block?  Not sure if you offer that or what your thoughts are for that in relation to this patient.  Thanks for your opinion.  Brett Canales ----- Message ----- From: Javier Glazier Sent: 11/30/2022  11:27 AM EDT To: Benancio Deeds, MD  Kind of a complicated/chronic one, He may need at least an EGD, getting repeat MRCP as he continues to drink and cyst has gotten larger in last CT's. Let me know any suggestions. Thanks!

## 2022-12-02 NOTE — Telephone Encounter (Signed)
Called to discuss EUS with celiac block with the patient for additional pain management. Suggest continuing with pain management appointment.   The risks of an EUS including intestinal perforation, bleeding, infection, aspiration, and medication effects should be discussed.   Can set up with Patty.

## 2022-12-02 NOTE — Telephone Encounter (Signed)
EUS celiac block scheduled for 8/22 at 315 pm at [facility] with GM   Left message on machine to call back

## 2022-12-02 NOTE — Telephone Encounter (Signed)
Attempted to reach pt again and message states voice mail not set up. Will attempt later.

## 2022-12-05 NOTE — Telephone Encounter (Signed)
Patient's elevated CA19-9 from 7 years ago has slightly risen.  Previous EUS by my partner Dr. Christella Hartigan years ago has been reviewed.  Will plan to reevaluate cyst and likely sample and perform celiac block at same time at the time of my EUS. GM

## 2022-12-05 NOTE — Telephone Encounter (Signed)
The pt has been that he has been referred to pain management NOT oncologist.  The pt has been advised of the information and verbalized understanding.

## 2022-12-05 NOTE — Telephone Encounter (Signed)
EUS scheduled, pt instructed and medications reviewed.  Patient instructions mailed to home.  Patient to call with any questions or concerns.  

## 2022-12-05 NOTE — Telephone Encounter (Signed)
Inbound call from patient requesting to speak about an oncologist. States he was asked if he had one but was unsure if he needs one. Please advise, thank you.

## 2022-12-06 ENCOUNTER — Telehealth: Payer: Self-pay

## 2022-12-06 ENCOUNTER — Other Ambulatory Visit: Payer: Self-pay

## 2022-12-06 DIAGNOSIS — K860 Alcohol-induced chronic pancreatitis: Secondary | ICD-10-CM

## 2022-12-06 NOTE — Telephone Encounter (Signed)
Referral to [facility] in [place] made on 7-16.  Once he spoke to them Patient indicated he did not want to see them.  Rec'd an email from [facility] on 7-30 indicating "We decline to accept this patient for scheduling at this time".  Called them at [phone], ext [phone] and LM for [name] - NP Referral Coordinator) to request additional information to document his chart".

## 2022-12-08 ENCOUNTER — Emergency Department (HOSPITAL_COMMUNITY): Payer: MEDICAID

## 2022-12-08 ENCOUNTER — Other Ambulatory Visit: Payer: Self-pay

## 2022-12-08 ENCOUNTER — Emergency Department (HOSPITAL_COMMUNITY)
Admission: EM | Admit: 2022-12-08 | Discharge: 2022-12-08 | Discharge status: Against Medical Advice | Payer: MEDICAID | Attending: Emergency Medicine | Admitting: Emergency Medicine

## 2022-12-08 ENCOUNTER — Encounter (HOSPITAL_COMMUNITY): Payer: Self-pay

## 2022-12-08 DIAGNOSIS — Z79899 Other long term (current) drug therapy: Secondary | ICD-10-CM | POA: Insufficient documentation

## 2022-12-08 DIAGNOSIS — I10 Essential (primary) hypertension: Secondary | ICD-10-CM | POA: Insufficient documentation

## 2022-12-08 DIAGNOSIS — R079 Chest pain, unspecified: Secondary | ICD-10-CM | POA: Insufficient documentation

## 2022-12-08 DIAGNOSIS — R1013 Epigastric pain: Secondary | ICD-10-CM

## 2022-12-08 DIAGNOSIS — Z91040 Latex allergy status: Secondary | ICD-10-CM | POA: Diagnosis not present

## 2022-12-08 DIAGNOSIS — K861 Other chronic pancreatitis: Secondary | ICD-10-CM | POA: Insufficient documentation

## 2022-12-08 DIAGNOSIS — R0602 Shortness of breath: Secondary | ICD-10-CM | POA: Diagnosis not present

## 2022-12-08 LAB — BASIC METABOLIC PANEL
Anion gap: 17 — ABNORMAL HIGH (ref 5–15)
BUN: 20 mg/dL (ref 6–20)
CO2: 16 mmol/L — ABNORMAL LOW (ref 22–32)
Calcium: 7.8 mg/dL — ABNORMAL LOW (ref 8.9–10.3)
Chloride: 111 mmol/L (ref 98–111)
Creatinine, Ser: 1.51 mg/dL — ABNORMAL HIGH (ref 0.61–1.24)
GFR, Estimated: 55 mL/min — ABNORMAL LOW (ref >60)
Glucose, Bld: 64 mg/dL — ABNORMAL LOW (ref 70–99)
Potassium: 4 mmol/L (ref 3.5–5.1)
Sodium: 144 mmol/L (ref 135–145)

## 2022-12-08 LAB — HEPATIC FUNCTION PANEL
ALT: 22 U/L (ref 0–44)
AST: 36 U/L (ref 15–41)
Albumin: 3.9 g/dL (ref 3.5–5.0)
Alkaline Phosphatase: 80 U/L (ref 38–126)
Bilirubin, Direct: 0.3 mg/dL — ABNORMAL HIGH (ref 0.0–0.2)
Indirect Bilirubin: 0.2 mg/dL — ABNORMAL LOW (ref 0.3–0.9)
Total Bilirubin: 0.5 mg/dL (ref 0.3–1.2)
Total Protein: 7.3 g/dL (ref 6.5–8.1)

## 2022-12-08 LAB — CBC
HCT: 28.1 % — ABNORMAL LOW (ref 39.0–52.0)
Hemoglobin: 8.8 g/dL — ABNORMAL LOW (ref 13.0–17.0)
MCH: 27.9 pg (ref 26.0–34.0)
MCHC: 31.3 g/dL (ref 30.0–36.0)
MCV: 89.2 fL (ref 80.0–100.0)
Platelets: 169 10*3/uL (ref 150–400)
RBC: 3.15 MIL/uL — ABNORMAL LOW (ref 4.22–5.81)
RDW: 17.3 % — ABNORMAL HIGH (ref 11.5–15.5)
WBC: 14.1 10*3/uL — ABNORMAL HIGH (ref 4.0–10.5)
nRBC: 0 % (ref 0.0–0.2)

## 2022-12-08 LAB — TROPONIN I (HIGH SENSITIVITY)
Troponin I (High Sensitivity): 6 ng/L (ref <18)
Troponin I (High Sensitivity): 7 ng/L (ref <18)

## 2022-12-08 LAB — LIPASE, BLOOD: Lipase: 22 U/L (ref 11–51)

## 2022-12-08 MED ORDER — LACTATED RINGERS IV BOLUS
1000.0000 mL | Freq: Once | INTRAVENOUS | Status: AC
  Administered 2022-12-08: 1000 mL via INTRAVENOUS

## 2022-12-08 MED ORDER — ALUM & MAG HYDROXIDE-SIMETH 200-200-20 MG/5ML PO SUSP
30.0000 mL | Freq: Once | ORAL | Status: AC
  Administered 2022-12-08: 30 mL via ORAL
  Filled 2022-12-08: qty 30

## 2022-12-08 MED ORDER — PROCHLORPERAZINE EDISYLATE 10 MG/2ML IJ SOLN
10.0000 mg | Freq: Once | INTRAMUSCULAR | Status: DC
  Filled 2022-12-08: qty 2

## 2022-12-08 MED ORDER — FAMOTIDINE 20 MG PO TABS
40.0000 mg | ORAL_TABLET | Freq: Once | ORAL | Status: AC
  Administered 2022-12-08: 40 mg via ORAL
  Filled 2022-12-08: qty 2

## 2022-12-08 NOTE — ED Notes (Signed)
Pt is a&ox4, warm and dry, asking to leave. Pt has been walking around the pod wanting to leave. Pt has taken himself off monitor and vitals. Pt has also pulled his IV out. Site wrapped with 2x2 and coban. Pt has also gone ahead and dressed himself. Pt remains pleasant and calm.

## 2022-12-08 NOTE — ED Notes (Signed)
X-ray at bedside

## 2022-12-08 NOTE — ED Notes (Signed)
Assumed care of pt who arrived via ems from home c/o midsternal cp radiating into left arm and sob. Pt has WPW and significant cardiac hx. Pt given 6mg  morphine enroute via ems current pain 3/10 Patient a/o x 4 respirations even and non labored Pt hypertensive burt did not take BP meds pta.

## 2022-12-08 NOTE — ED Notes (Signed)
Pt is a&ox4 and ambulatory. Pt states that he would like to leave and was very pleasant and cooperative. IV removed, covered with 2x2 and coban. Pt was able to take himself off monitor and vitals. He had already called his sister to come get him. Pt has been having issues with the pain clinic.

## 2022-12-08 NOTE — ED Provider Notes (Signed)
  Physical Exam  BP (!) 156/94   Pulse 85   Temp 98.4 F (36.9 C) (Oral)   Resp 19   Ht 5\' 8"  (1.727 m)   Wt 55 kg   SpO2 98%   BMI 18.44 kg/m   Physical Exam Vitals and nursing note reviewed.  Constitutional:      General: He is not in acute distress.    Appearance: He is well-developed.  HENT:     Head: Normocephalic and atraumatic.  Eyes:     Conjunctiva/sclera: Conjunctivae normal.  Cardiovascular:     Rate and Rhythm: Normal rate and regular rhythm.     Heart sounds: No murmur heard. Pulmonary:     Effort: Pulmonary effort is normal. No respiratory distress.     Breath sounds: Normal breath sounds.  Abdominal:     Palpations: Abdomen is soft.     Tenderness: There is no abdominal tenderness.     Comments: Very mild tenderness to palpation epigastric region and left upper quadrant  Musculoskeletal:        General: No swelling.     Cervical back: Neck supple.  Skin:    General: Skin is warm and dry.     Capillary Refill: Capillary refill takes less than 2 seconds.  Neurological:     Mental Status: He is alert.  Psychiatric:        Mood and Affect: Mood normal.     Procedures  Procedures  ED Course / MDM   Clinical Course as of 12/08/22 0721  Thu Dec 08, 2022  1610 Frequent ED visits, chronic abd pain, chronic pancrreatitis. Here w CP and radiation to arm. Now epigastric pain. First trop 6. Getting delta trop. EKG ok. Plan to DC if trop flat.  [WL]  0719 Lipase: 22 [WL]    Clinical Course User Index [WL] Dyanne Iha, MD   Medical Decision Making Amount and/or Complexity of Data Reviewed Labs: ordered. Decision-making details documented in ED Course. Radiology: ordered.  Risk OTC drugs. Prescription drug management.   Upon reassessment, patient resting comfortably with stable vital signs.  Remains afebrile.  Complaining of mild ache in the upper abdomen.  Requesting to go home.  Patient counseled on importance of staying for IV fluid  administration considering AKI with creatinine of 1.5 from 0.75.  Also counseled on the importance of staying for second troponin.  Patient complaining of nausea and abdominal pain.  Compazine, famotidine administered in addition to IV fluids.  Hepatic function panel ordered.  Upon reassessment, patient requesting pain medication.  Conversation held about importance of refraining from narcotic pain medication to treat chronic abdominal pain.  Patient expressed agreement and understanding.  Patient then became frustrated and pulled out his IV, refusing additional workup and additional fluids.  Discharged AMA.     Dyanne Iha, MD 12/08/22 9604    Margarita Grizzle, MD 12/08/22 1537

## 2022-12-08 NOTE — ED Triage Notes (Signed)
C/o midsternal cp and sob radiates into left arm onset 1.5 hours ago 6mg  Morphine via EMS

## 2022-12-08 NOTE — ED Provider Notes (Cosign Needed)
[place] EMERGENCY DEPARTMENT AT [facility] Provider Note   CSN: [id number] Arrival date & time: 12/08/22  4665     History  Chief Complaint  Patient presents with   Chest Pain    Midsternal radiating into left arm onset 1.5 hopurs ago hx WPW    [name] is a 52 y.o. male.  Patient with past medical history significant for GERD, chronic pancreatitis, polysubstance abuse, WPW, pancreatic pseudocyst/cyst, hypertension presents to the emergency department via EMS complaining of midsternal chest pain with shortness of breath and radiation into left arm which began 1 and half hours prior to arrival.  EMS administered 6 mg of morphine during transport.  Upon my assessment patient states pain is more epigastric in nature denies current radiation into the left arm or chest pain at this time.  He is requesting more pain medication upon my assessment.  Patient denies nausea, vomiting, shortness of breath at this time.  HPI     Home Medications Prior to Admission medications   Medication Sig Start Date End Date Taking? Authorizing Provider  albuterol (VENTOLIN HFA) 108 (90 Base) MCG/ACT inhaler Inhale 2 puffs into the lungs every 4 (four) hours as needed for wheezing or shortness of breath. 07/23/22   Ghimire, Werner Lean, MD  alum & mag hydroxide-simeth (MAALOX/MYLANTA) 200-200-20 MG/5ML suspension Take 30 mLs by mouth every 6 (six) hours as needed for indigestion or heartburn. 11/30/22   Doree Albee, PA-C  amiodarone (PACERONE) 200 MG tablet 200 mg twice daily x 1 week, then switch to 200 mg daily Patient taking differently: Take 200 mg by mouth daily. 07/23/22   Ghimire, Werner Lean, MD  carvedilol (COREG) 12.5 MG tablet Take 1 tablet (12.5 mg total) by mouth 2 (two) times daily with a meal. 09/21/22   Pokhrel, Rebekah Chesterfield, MD  chlorhexidine (PERIDEX) 0.12 % solution Use as directed 15 mLs in the mouth or throat 2 (two) times daily. 09/21/22   Pokhrel, Rebekah Chesterfield, MD  ferrous  sulfate 325 (65 FE) MG tablet Take 325 mg by mouth daily. 05/11/21   [provider]  fluticasone (FLONASE) 50 MCG/ACT nasal spray Place 2 sprays into both nostrils in the morning, at noon, and at bedtime.    [provider]  folic acid (FOLVITE) 1 MG tablet Take 1 tablet (1 mg total) by mouth daily. 09/21/22   Pokhrel, Rebekah Chesterfield, MD  lidocaine (XYLOCAINE) 2 % solution Use as directed 15 mLs in the mouth or throat every 6 (six) hours as needed for mouth pain. 11/30/22   Doree Albee, PA-C  lipase/protease/amylase (CREON) 36000 UNITS CPEP capsule Take 2 capsules (72,000 Units total) by mouth 3 (three) times daily with meals. May also take 1 capsule (36,000 Units total) as needed (with snacks). 09/21/22   Pokhrel, Rebekah Chesterfield, MD  loperamide (IMODIUM) 2 MG capsule Take 2 capsules (4 mg total) by mouth as needed for diarrhea or loose stools. 11/30/22   Doree Albee, PA-C  metoprolol tartrate (LOPRESSOR) 100 MG tablet Take 100 mg by mouth 2 (two) times daily.    [provider]  Multiple Vitamin (MULTIVITAMIN WITH MINERALS) TABS tablet Take 1 tablet by mouth daily. Patient taking differently: Take 2 tablets by mouth daily. 07/23/22   Ghimire, Werner Lean, MD  nicotine (NICODERM CQ - DOSED IN MG/24 HOURS) 21 mg/24hr patch Place 1 patch (21 mg total) onto the skin at bedtime. 07/23/22   Ghimire, Werner Lean, MD  nitroGLYCERIN (NITROSTAT) 0.4 MG SL tablet Place 0.4  mg under the tongue every 5 (five) minutes as needed for chest pain.    [provider]  ondansetron (ZOFRAN-ODT) 4 MG disintegrating tablet Take 1 tablet (4 mg total) by mouth 2 (two) times daily as needed for nausea or vomiting. 09/21/22   Pokhrel, Rebekah Chesterfield, MD  pantoprazole (PROTONIX) 40 MG tablet Take 1 tablet (40 mg total) by mouth daily. 11/30/22   Doree Albee, PA-C  pregabalin (LYRICA) 100 MG capsule Take 1 capsule (100 mg total) by mouth 2 (two) times daily. 11/30/22   Doree Albee, PA-C  promethazine  (PHENERGAN) 25 MG tablet Take 25 mg by mouth as needed for nausea or vomiting. 06/30/22   [provider]  sucralfate (CARAFATE) 1 GM/10ML suspension Take 10 mLs (1 g total) by mouth See admin instructions. Take 4 times with meals and at bedtime. 11/30/22   Doree Albee, PA-C  thiamine (VITAMIN B1) 100 MG tablet Take 1 tablet (100 mg total) by mouth daily. 07/23/22   Ghimire, Werner Lean, MD  Vitamin D, Ergocalciferol, (DRISDOL) 1.25 MG (50000 UNIT) CAPS capsule Take 1 capsule (50,000 Units total) by mouth every Wednesday. Patient taking differently: Take 50,000 Units by mouth once a week. 07/27/22   Ghimire, Werner Lean, MD  amitriptyline (ELAVIL) 25 MG tablet Take 1 tablet (25 mg total) by mouth at bedtime. Patient not taking: Reported on 08/08/2019 10/15/18 08/08/19  Rolly Salter, MD      Allergies    Robaxin [methocarbamol], Aspirin, Shellfish-derived products, Trazodone and nefazodone, Adhesive [tape], Latex, Toradol [ketorolac tromethamine], Contrast media [iodinated contrast media], Reglan [metoclopramide], and Salmon [fish oil]    Review of Systems   Review of Systems  Physical Exam Updated Vital Signs BP (!) 156/94   Pulse 85   Temp 98.4 F (36.9 C) (Oral)   Resp 19   Ht 5\' 8"  (1.727 m)   Wt 55 kg   SpO2 98%   BMI 18.44 kg/m  Physical Exam Vitals and nursing note reviewed.  Constitutional:      General: He is not in acute distress.    Appearance: He is well-developed.  HENT:     Head: Normocephalic and atraumatic.  Eyes:     Conjunctiva/sclera: Conjunctivae normal.  Cardiovascular:     Rate and Rhythm: Normal rate and regular rhythm.     Heart sounds: No murmur heard. Pulmonary:     Effort: Pulmonary effort is normal. No respiratory distress.     Breath sounds: Normal breath sounds.  Abdominal:     Palpations: Abdomen is soft.     Tenderness: There is abdominal tenderness (Epigastric).  Musculoskeletal:        General: No swelling.     Cervical back: Neck  supple.     Right lower leg: No edema.     Left lower leg: No edema.  Skin:    General: Skin is warm and dry.     Capillary Refill: Capillary refill takes less than 2 seconds.  Neurological:     Mental Status: He is alert.  Psychiatric:        Mood and Affect: Mood normal.     ED Results / Procedures / Treatments   Labs (all labs ordered are listed, but only abnormal results are displayed) Labs Reviewed  BASIC METABOLIC PANEL - Abnormal; Notable for the following components:      Result Value   CO2 16 (*)    Glucose, Bld 64 (*)    Creatinine, Ser 1.51 (*)  Calcium 7.8 (*)    GFR, Estimated 55 (*)    Anion gap 17 (*)    All other components within normal limits  CBC - Abnormal; Notable for the following components:   WBC 14.1 (*)    RBC 3.15 (*)    Hemoglobin 8.8 (*)    HCT 28.1 (*)    RDW 17.3 (*)    All other components within normal limits  LIPASE, BLOOD  TROPONIN I (HIGH SENSITIVITY)    EKG None  Radiology DG Chest Port 1 View  Result Date: 12/08/2022 CLINICAL DATA:  Chest pain EXAM: PORTABLE CHEST 1 VIEW COMPARISON:  11/28/2022 FINDINGS: Artifact from EKG leads. Normal heart size and mediastinal contours. No acute infiltrate or edema. No effusion or pneumothorax. No acute osseous findings. IMPRESSION: No active disease. Electronically Signed   By: Tiburcio Pea M.D.   On: 12/08/2022 06:45    Procedures Procedures    Medications Ordered in ED Medications  lactated ringers bolus 1,000 mL (has no administration in time range)  alum & mag hydroxide-simeth (MAALOX/MYLANTA) 200-200-20 MG/5ML suspension 30 mL (30 mLs Oral Given 12/08/22 0554)    ED Course/ Medical Decision Making/ A&P Clinical Course as of 12/08/22 0700  Thu Dec 08, 2022  3329 Frequent ED visits, chronic abd pain, chronic pancrreatitis. Here w CP and radiation to arm. Now epigastric pain. First trop 6. Getting delta trop. EKG ok. Plan to DC if trop flat.  [WL]    Clinical Course User  Index [WL] Dyanne Iha, MD                                 Medical Decision Making Amount and/or Complexity of Data Reviewed Labs: ordered. Radiology: ordered.  Risk OTC drugs.   This patient presents to the ED for concern of abdominal pain, this involves an extensive number of treatment options, and is a complaint that carries with it a high risk of complications and morbidity.  The differential diagnosis includes acute on chronic pancreatitis, ACS, pneumonia, others   Co morbidities that complicate the patient evaluation  Chronic epigastric pain/chronic pancreatitis, WPW   Additional history obtained:  Additional history obtained from EMS External records from outside source obtained and reviewed including notes from gastroenterology   Lab Tests:  I Ordered, and personally interpreted labs.  The pertinent results include: WBC 14.1, hemoglobin 8.8   Imaging Studies ordered:  I considered ordering abdominal imaging but the patient had a CT abdomen pelvis 9 days ago. Presentation continues to be consistent with chronic pain with no new features.  I ordered a chest x-ray.   Cardiac Monitoring: / EKG:  The patient was maintained on a cardiac monitor.  I personally viewed and interpreted the cardiac monitored which showed an underlying rhythm of: Sinus rhythm    Problem List / ED Course / Critical interventions / Medication management   I ordered medication including GI cocktail for abdominal pain Reevaluation of the patient after these medicines showed that the patient improved I have reviewed the patients home medicines and have made adjustments as needed   Social Determinants of Health:  Patient currently has no PCP, no pain management provider   Test / Admission - Considered:  Patient's last CT scan with acute on chronic pancreatitis had corresponding lipase of 46, doubt acute on chronic flare at this time. Patient with troponin of 6, nonischemic EKG,  no current complaints of chest pain or  shortness of breath. Plan to transfer care to Dr. Mcneil Sober at shift handoff. If repeat troponin remains low anticipate discharge home. Pain seems most consistent at this time with patient's chronic abdominal pain.          Final Clinical Impression(s) / ED Diagnoses Final diagnoses:  None    Rx / DC Orders ED Discharge Orders     None         Darrick Grinder, PA-C 12/08/22 0700

## 2022-12-09 ENCOUNTER — Ambulatory Visit (HOSPITAL_COMMUNITY): Payer: MEDICAID

## 2022-12-12 ENCOUNTER — Telehealth: Payer: Self-pay | Admitting: Physician Assistant

## 2022-12-12 NOTE — Telephone Encounter (Signed)
Inbound call from [facility] regarding referral for patient. States they need to have patient's insurance information and last 2 office visits faxed to [phone]. Please advise, thank you.

## 2022-12-17 ENCOUNTER — Encounter (HOSPITAL_COMMUNITY): Payer: Self-pay

## 2022-12-17 ENCOUNTER — Emergency Department (HOSPITAL_COMMUNITY): Payer: MEDICAID

## 2022-12-17 ENCOUNTER — Other Ambulatory Visit: Payer: Self-pay

## 2022-12-17 ENCOUNTER — Emergency Department (HOSPITAL_COMMUNITY)
Admission: EM | Admit: 2022-12-17 | Discharge: 2022-12-17 | Disposition: A | Discharge status: Home / Self Care | Payer: MEDICAID | Attending: Emergency Medicine | Admitting: Emergency Medicine

## 2022-12-17 DIAGNOSIS — Z8711 Personal history of peptic ulcer disease: Secondary | ICD-10-CM

## 2022-12-17 DIAGNOSIS — K2901 Acute gastritis with bleeding: Secondary | ICD-10-CM

## 2022-12-17 DIAGNOSIS — Z91040 Latex allergy status: Secondary | ICD-10-CM | POA: Diagnosis not present

## 2022-12-17 DIAGNOSIS — R079 Chest pain, unspecified: Secondary | ICD-10-CM | POA: Diagnosis present

## 2022-12-17 LAB — BASIC METABOLIC PANEL
Anion gap: 10 (ref 5–15)
BUN: 14 mg/dL (ref 6–20)
CO2: 19 mmol/L — ABNORMAL LOW (ref 22–32)
Calcium: 8.4 mg/dL — ABNORMAL LOW (ref 8.9–10.3)
Chloride: 105 mmol/L (ref 98–111)
Creatinine, Ser: 1.04 mg/dL (ref 0.61–1.24)
GFR, Estimated: >60 mL/min (ref >60)
Glucose, Bld: 86 mg/dL (ref 70–99)
Potassium: 4.2 mmol/L (ref 3.5–5.1)
Sodium: 134 mmol/L — ABNORMAL LOW (ref 135–145)

## 2022-12-17 LAB — CBC
HCT: 31.6 % — ABNORMAL LOW (ref 39.0–52.0)
Hemoglobin: 9.9 g/dL — ABNORMAL LOW (ref 13.0–17.0)
MCH: 27.2 pg (ref 26.0–34.0)
MCHC: 31.3 g/dL (ref 30.0–36.0)
MCV: 86.8 fL (ref 80.0–100.0)
Platelets: 224 10*3/uL (ref 150–400)
RBC: 3.64 MIL/uL — ABNORMAL LOW (ref 4.22–5.81)
RDW: 17.6 % — ABNORMAL HIGH (ref 11.5–15.5)
WBC: 8.2 10*3/uL (ref 4.0–10.5)
nRBC: 0 % (ref 0.0–0.2)

## 2022-12-17 LAB — URINALYSIS, ROUTINE W REFLEX MICROSCOPIC
Bilirubin Urine: NEGATIVE
Glucose, UA: NEGATIVE mg/dL
Hgb urine dipstick: NEGATIVE
Ketones, ur: NEGATIVE mg/dL
Leukocytes,Ua: NEGATIVE
Nitrite: NEGATIVE
Protein, ur: NEGATIVE mg/dL
Specific Gravity, Urine: 1.019 (ref 1.005–1.030)
pH: 5 (ref 5.0–8.0)

## 2022-12-17 LAB — POC OCCULT BLOOD, ED: Fecal Occult Bld: POSITIVE — AB

## 2022-12-17 LAB — LIPASE, BLOOD: Lipase: 29 U/L (ref 11–51)

## 2022-12-17 LAB — TROPONIN I (HIGH SENSITIVITY)
Troponin I (High Sensitivity): 5 ng/L (ref <18)
Troponin I (High Sensitivity): 5 ng/L (ref <18)

## 2022-12-17 MED ORDER — ALUM & MAG HYDROXIDE-SIMETH 200-200-20 MG/5ML PO SUSP
30.0000 mL | Freq: Once | ORAL | Status: AC
  Administered 2022-12-17: 30 mL via ORAL
  Filled 2022-12-17: qty 30

## 2022-12-17 MED ORDER — ACETAMINOPHEN 500 MG PO TABS
1000.0000 mg | ORAL_TABLET | Freq: Once | ORAL | Status: AC
  Administered 2022-12-17: 1000 mg via ORAL
  Filled 2022-12-17: qty 2

## 2022-12-17 MED ORDER — LIDOCAINE VISCOUS HCL 2 % MT SOLN: 15.0000 mL | Freq: Four times a day (QID) | OROMUCOSAL | 0 refills | Status: DC | PRN

## 2022-12-17 MED ORDER — ONDANSETRON HCL 4 MG/2ML IJ SOLN
4.0000 mg | Freq: Once | INTRAMUSCULAR | Status: AC
  Administered 2022-12-17: 4 mg via INTRAVENOUS
  Filled 2022-12-17: qty 2

## 2022-12-17 MED ORDER — FAMOTIDINE 20 MG PO TABS: 20.0000 mg | ORAL_TABLET | Freq: Two times a day (BID) | ORAL | 0 refills | Status: DC

## 2022-12-17 MED ORDER — SODIUM CHLORIDE 0.9 % IV BOLUS
1000.0000 mL | Freq: Once | INTRAVENOUS | Status: AC
  Administered 2022-12-17: 1000 mL via INTRAVENOUS

## 2022-12-17 MED ORDER — PANTOPRAZOLE SODIUM 40 MG IV SOLR
40.0000 mg | Freq: Once | INTRAVENOUS | Status: AC
  Administered 2022-12-17: 40 mg via INTRAVENOUS
  Filled 2022-12-17: qty 10

## 2022-12-17 MED ORDER — MORPHINE SULFATE (PF) 4 MG/ML IV SOLN
4.0000 mg | Freq: Once | INTRAVENOUS | Status: AC
  Administered 2022-12-17: 4 mg via INTRAVENOUS
  Filled 2022-12-17: qty 1

## 2022-12-17 NOTE — ED Provider Notes (Signed)
[place] EMERGENCY DEPARTMENT AT [facility] Provider Note   CSN: [id number] Arrival date & time: 12/17/22  1026     History  Chief Complaint  Patient presents with   Chest Pain    [name] is a 52 y.o. male.  The history is provided by the patient and medical records. No language interpreter was used.  Chest Pain   52 yo male with hx of chronic pancreatitis, alcohol abuse, WPW syndrome, and cocaine use presents to the ED today complaining of nausea and epigastric pain x ~15 hours. Notes he ate fried chicken for dinner last night and started to feel nauseated. Had difficulty sleeping. Ate eggs and grits this morning and vomited shortly after. He also endorses "spongy stool" that is dark brown and black in color. His last normal bowel movement was 2 days ago. Denies hematemesis. Pain is generalized but worse in his epigastric area. Pain radiates to chest. Denies diaphoresis, numbness and tingling down his arm, jaw pain, headache. Endorses dyspnea on exertion. He notes that this feel a lot like when he has an acute episode of pancreatitis. He has taken his normally prescribed medicines regularly and has not missed a recent dose. No known sick contacts or recent travels.   Home Medications Prior to Admission medications   Medication Sig Start Date End Date Taking? Authorizing Provider  albuterol (VENTOLIN HFA) 108 (90 Base) MCG/ACT inhaler Inhale 2 puffs into the lungs every 4 (four) hours as needed for wheezing or shortness of breath. 07/23/22   Ghimire, Werner Lean, MD  alum & mag hydroxide-simeth (MAALOX/MYLANTA) 200-200-20 MG/5ML suspension Take 30 mLs by mouth every 6 (six) hours as needed for indigestion or heartburn. 11/30/22   Doree Albee, PA-C  amiodarone (PACERONE) 200 MG tablet 200 mg twice daily x 1 week, then switch to 200 mg daily Patient taking differently: Take 200 mg by mouth daily. 07/23/22   Ghimire, Werner Lean, MD  carvedilol (COREG) 12.5 MG  tablet Take 1 tablet (12.5 mg total) by mouth 2 (two) times daily with a meal. 09/21/22   Pokhrel, Rebekah Chesterfield, MD  chlorhexidine (PERIDEX) 0.12 % solution Use as directed 15 mLs in the mouth or throat 2 (two) times daily. 09/21/22   Pokhrel, Rebekah Chesterfield, MD  ferrous sulfate 325 (65 FE) MG tablet Take 325 mg by mouth daily. 05/11/21   [provider]  fluticasone (FLONASE) 50 MCG/ACT nasal spray Place 2 sprays into both nostrils in the morning, at noon, and at bedtime.    [provider]  folic acid (FOLVITE) 1 MG tablet Take 1 tablet (1 mg total) by mouth daily. 09/21/22   Pokhrel, Rebekah Chesterfield, MD  lidocaine (XYLOCAINE) 2 % solution Use as directed 15 mLs in the mouth or throat every 6 (six) hours as needed for mouth pain. 11/30/22   Doree Albee, PA-C  lipase/protease/amylase (CREON) 36000 UNITS CPEP capsule Take 2 capsules (72,000 Units total) by mouth 3 (three) times daily with meals. May also take 1 capsule (36,000 Units total) as needed (with snacks). 09/21/22   Pokhrel, Rebekah Chesterfield, MD  loperamide (IMODIUM) 2 MG capsule Take 2 capsules (4 mg total) by mouth as needed for diarrhea or loose stools. 11/30/22   Doree Albee, PA-C  metoprolol tartrate (LOPRESSOR) 100 MG tablet Take 100 mg by mouth 2 (two) times daily.    [provider]  Multiple Vitamin (MULTIVITAMIN WITH MINERALS) TABS tablet Take 1 tablet by mouth daily. Patient taking differently: Take 2 tablets by mouth  daily. 07/23/22   Ghimire, Werner Lean, MD  nicotine (NICODERM CQ - DOSED IN MG/24 HOURS) 21 mg/24hr patch Place 1 patch (21 mg total) onto the skin at bedtime. 07/23/22   Ghimire, Werner Lean, MD  nitroGLYCERIN (NITROSTAT) 0.4 MG SL tablet Place 0.4 mg under the tongue every 5 (five) minutes as needed for chest pain.    [provider]  ondansetron (ZOFRAN-ODT) 4 MG disintegrating tablet Take 1 tablet (4 mg total) by mouth 2 (two) times daily as needed for nausea or vomiting. 09/21/22   Pokhrel, Rebekah Chesterfield, MD   pantoprazole (PROTONIX) 40 MG tablet Take 1 tablet (40 mg total) by mouth daily. 11/30/22   Doree Albee, PA-C  pregabalin (LYRICA) 100 MG capsule Take 1 capsule (100 mg total) by mouth 2 (two) times daily. 11/30/22   Doree Albee, PA-C  promethazine (PHENERGAN) 25 MG tablet Take 25 mg by mouth as needed for nausea or vomiting. 06/30/22   [provider]  sucralfate (CARAFATE) 1 GM/10ML suspension Take 10 mLs (1 g total) by mouth See admin instructions. Take 4 times with meals and at bedtime. 11/30/22   Doree Albee, PA-C  thiamine (VITAMIN B1) 100 MG tablet Take 1 tablet (100 mg total) by mouth daily. 07/23/22   Ghimire, Werner Lean, MD  Vitamin D, Ergocalciferol, (DRISDOL) 1.25 MG (50000 UNIT) CAPS capsule Take 1 capsule (50,000 Units total) by mouth every Wednesday. Patient taking differently: Take 50,000 Units by mouth once a week. 07/27/22   Ghimire, Werner Lean, MD  amitriptyline (ELAVIL) 25 MG tablet Take 1 tablet (25 mg total) by mouth at bedtime. Patient not taking: Reported on 08/08/2019 10/15/18 08/08/19  Rolly Salter, MD      Allergies    Robaxin [methocarbamol], Aspirin, Shellfish-derived products, Trazodone and nefazodone, Adhesive [tape], Latex, Toradol [ketorolac tromethamine], Contrast media [iodinated contrast media], Reglan [metoclopramide], and Salmon [fish oil]    Review of Systems   Review of Systems  Cardiovascular:  Positive for chest pain.  All other systems reviewed and are negative.   Physical Exam Updated Vital Signs BP (!) 141/95   Pulse 72   Temp 98.3 F (36.8 C) (Oral)   Resp 11   Ht 5\' 8"  (1.727 m)   Wt 56.7 kg   SpO2 100%   BMI 19.01 kg/m  Physical Exam Vitals and nursing note reviewed.  Constitutional:      General: He is not in acute distress.    Appearance: He is well-developed.  HENT:     Head: Atraumatic.  Eyes:     Conjunctiva/sclera: Conjunctivae normal.  Genitourinary:    Comments: Chaperone present during exam.  Normal  rectal tone no obvious mass normal color stool on glove no impacted stool.  No gross bleeding. Musculoskeletal:     Cervical back: Neck supple.  Skin:    Findings: No rash.  Neurological:     Mental Status: He is alert.     ED Results / Procedures / Treatments   Labs (all labs ordered are listed, but only abnormal results are displayed) Labs Reviewed  BASIC METABOLIC PANEL - Abnormal; Notable for the following components:      Result Value   Sodium 134 (*)    CO2 19 (*)    Calcium 8.4 (*)    All other components within normal limits  CBC - Abnormal; Notable for the following components:   RBC 3.64 (*)    Hemoglobin 9.9 (*)    HCT 31.6 (*)  RDW 17.6 (*)    All other components within normal limits  LIPASE, BLOOD  URINALYSIS, ROUTINE W REFLEX MICROSCOPIC  POC OCCULT BLOOD, ED  TROPONIN I (HIGH SENSITIVITY)  TROPONIN I (HIGH SENSITIVITY)    EKG EKG Interpretation Date/Time:  Saturday December 17 2022 10:39:17 EDT Ventricular Rate:  74 PR Interval:  104 QRS Duration:  82 QT Interval:  366 QTC Calculation: 406 R Axis:   73  Text Interpretation: Sinus rhythm with short PR Possible Left atrial enlargement Nonspecific T wave abnormality Abnormal ECG When compared with ECG of 08-Dec-2022 05:30, PREVIOUS ECG IS PRESENT similar to prior EKG Confirmed by Rolan Bucco [id number]) on 12/17/2022 11:08:14 AM  Radiology DG Chest 2 View  Result Date: 12/17/2022 CLINICAL DATA:  52 year old male with chest pain and dizziness. EXAM: CHEST - 2 VIEW COMPARISON:  Portable chest 12/08/2022 and earlier. FINDINGS: AP and lateral views at 1113 hours. Lung volumes and mediastinal contours are normal. Visualized tracheal air column is within normal limits. Lung markings appear stable and within normal limits, both lungs appear clear. No pneumothorax or pleural effusion. Negative visible bowel gas and osseous structures. IMPRESSION: No acute cardiopulmonary abnormality. Electronically Signed   By: Odessa Fleming M.D.   On: 12/17/2022 11:18    Procedures Procedures    Medications Ordered in ED Medications  acetaminophen (TYLENOL) tablet 1,000 mg (has no administration in time range)  alum & mag hydroxide-simeth (MAALOX/MYLANTA) 200-200-20 MG/5ML suspension 30 mL (30 mLs Oral Given 12/17/22 1327)  sodium chloride 0.9 % bolus 1,000 mL (1,000 mLs Intravenous New Bag/Given 12/17/22 1327)  ondansetron (ZOFRAN) injection 4 mg (4 mg Intravenous Given 12/17/22 1327)  pantoprazole (PROTONIX) injection 40 mg (40 mg Intravenous Given 12/17/22 1327)    ED Course/ Medical Decision Making/ A&P                                 Medical Decision Making Amount and/or Complexity of Data Reviewed Labs: ordered. Radiology: ordered.  Risk OTC drugs. Prescription drug management.   BP (!) 141/95   Pulse 72   Temp 98.3 F (36.8 C) (Oral)   Resp 11   Ht 5\' 8"  (1.727 m)   Wt 56.7 kg   SpO2 100%   BMI 19.01 kg/m   12:43 PM 52 yo male with hx of chronic pancreatitis, alcohol abuse, WPW syndrome, and cocaine use presents to the ED today complaining of nausea and epigastric pain x ~15 hours. Notes he ate fried chicken for dinner last night and started to feel nauseated. Had difficulty sleeping. Ate eggs and grits this morning and vomited shortly after. He also endorses "spongy stool" that is dark brown and black in color. His last normal bowel movement was 2 days ago. Denies hematemesis. Pain is generalized but worse in his epigastric area. Pain radiates to chest. Denies diaphoresis, numbness and tingling down his arm, jaw pain, headache. Endorses dyspnea on exertion. He notes that this feel a lot like when he has an acute episode of pancreatitis. He has taken his normally prescribed medicines regularly and has not missed a recent dose. No known sick contacts or recent travels.   Patient tried taking his Creon, sucralfate and other medication prescribed at home but it did not provide adequate relief.  Last  alcohol use was yesterday approximately 2 beers.  Last cocaine use was 3 days ago.  On exam patient is laying in bed appears to be in  no acute discomfort.  Heart with normal rate and rhythm, lungs clear to auscultation bilaterally abdomen is soft but diffusely tender more significant to left upper quadrant.  -Labs ordered, independently viewed and interpreted by me.  Labs remarkable for hemoccult positive.  Hgb 9.9 -The patient was maintained on a cardiac monitor.  I personally viewed and interpreted the cardiac monitored which showed an underlying rhythm of: NSR -Imaging independently viewed and interpreted by me and I agree with radiologist's interpretation.  Result remarkable for CXR unremarkable -This patient presents to the ED for concern of abd pain, this involves an extensive number of treatment options, and is a complaint that carries with it a high risk of complications and morbidity.  The differential diagnosis includes pancreatitis, gastritis, PUD, cholecystitis, appendicitis, colitis, diverticulitis -Co morbidities that complicate the patient evaluation includes pancreatitis, alcohol use, WPW, polysubstance use -Treatment includes zofran, tylenol, morphine, GI cocktail -Reevaluation of the patient after these medicines showed that the patient improved -PCP office notes or outside notes reviewed -Escalation to admission/observation considered: patients feels much better, is comfortable with discharge, and will follow up with PCP -Prescription medication considered, patient comfortable with home medication and H2 blocker -Social Determinant of Health considered which includes tobacco use and polysubstance use         Final Clinical Impression(s) / ED Diagnoses Final diagnoses:  Acute gastritis with hemorrhage, unspecified gastritis type    Rx / DC Orders ED Discharge Orders          Ordered    famotidine (PEPCID) 20 MG tablet  2 times daily        12/17/22 1655               Fayrene Helper, PA-C 12/17/22 1700    Rolan Bucco, MD 12/17/22 1709

## 2022-12-17 NOTE — ED Triage Notes (Addendum)
Pt BIB [facility] from home c/o centralized CP that radiates to his left arm, jaw and back that started 45 mins prior to arrival. Pt allergic aspirin received 4mg  of zofran. Pt requesting morphine by name. Pt did take a dose of Viagra this morning so EMS was not able to give nitroglycerin.  Pt states he has pancreatis and it kinda feels like that but it is traveling up to his chest. Pt states he has had N/V/D as well for a couple days.

## 2022-12-17 NOTE — Discharge Instructions (Addendum)
Your abdominal discomfort is likely due to gastritis and possibly pancreatitis.  Please avoid alcohol at all cost.  Avoid greasy fatty food and drink mostly water as you give your body a chance to heal.  Continue to take sucralfate, Creon, and your home medication.  Follow-up closely with your GI specialist for further care.

## 2022-12-20 ENCOUNTER — Encounter (HOSPITAL_COMMUNITY): Payer: Self-pay | Admitting: Gastroenterology

## 2022-12-21 NOTE — Progress Notes (Signed)
Attempted to obtain medical history via telephone, unable to reach at this time. HIPAA compliant voicemail message left requesting return call to pre surgical testing department. 

## 2022-12-28 ENCOUNTER — Encounter (HOSPITAL_COMMUNITY): Payer: Self-pay | Admitting: Certified Registered"

## 2022-12-29 ENCOUNTER — Ambulatory Visit (HOSPITAL_COMMUNITY): Admission: RE | Admit: 2022-12-29 | Payer: MEDICAID | Source: Home / Self Care | Admitting: Gastroenterology

## 2022-12-29 ENCOUNTER — Encounter (HOSPITAL_COMMUNITY): Admission: RE | Payer: Self-pay | Source: Home / Self Care

## 2022-12-29 SURGERY — UPPER ENDOSCOPIC ULTRASOUND (EUS) RADIAL
Anesthesia: Monitor Anesthesia Care

## 2022-12-29 NOTE — Progress Notes (Addendum)
Patient called by this RN for being late for procedure. No answer x2.  Update: able to reach Bruce's mother via emergency contact in the chart. She says she is his source of transportation. She is unsure where he is today. She will tell him to call the office to reschedule.

## 2022-12-29 NOTE — Anesthesia Preprocedure Evaluation (Signed)
Anesthesia Evaluation    Airway        Dental   Pulmonary COPD,  COPD inhaler, Current Smoker and Patient abstained from smoking.          Cardiovascular hypertension, Pt. on medications and Pt. on home beta blockers + dysrhythmias Atrial Fibrillation   '23 ECHO: EF 65 to 70%.  1. The LV has normal function, no regional wall motion abnormalities. There is mild left ventricular hypertrophy. Left ventricular diastolic parameters were grossly normal. No significant intracavitary gradient.   2. RVF is normal. The right ventricular size is normal.   3. The mitral valve is normal in structure. No evidence of MR. No evidence of mitral stenosis.   4. The aortic valve is grossly normal. AI is not visualized. No aortic stenosis is present.     Neuro/Psych  Headaches  Anxiety Depression Bipolar Disorder      GI/Hepatic ,GERD  Medicated,,(+)     substance abuse  cocaine use and marijuana useChronic pancreatitis   Endo/Other    Renal/GU      Musculoskeletal  (+) Arthritis ,    Abdominal   Peds  Hematology  (+) Blood dyscrasia (Hb 9.9), Sickle cell trait and anemia   Anesthesia Other Findings   Reproductive/Obstetrics                              Anesthesia Physical Anesthesia Plan  ASA: 3  Anesthesia Plan: MAC   Post-op Pain Management: Minimal or no pain anticipated   Induction: Intravenous  PONV Risk Score and Plan: Treatment may vary due to age or medical condition  Airway Management Planned: Natural Airway and Nasal Cannula  Additional Equipment: None  Intra-op Plan:   Post-operative Plan:   Informed Consent:   Plan Discussed with:   Anesthesia Plan Comments:          Anesthesia Quick Evaluation

## 2022-12-30 ENCOUNTER — Telehealth: Payer: Self-pay | Admitting: Gastroenterology

## 2022-12-30 NOTE — Telephone Encounter (Signed)
Dr Meridee Score the pt no showed his EUS yesterday 8/22 and he is calling to reschedule.  Do you want him to come in to the office before rescheduling?

## 2022-12-30 NOTE — Telephone Encounter (Signed)
Inbound call from patient wishing to reschedule yesterday's 8/22 procedure.  Requesting a call back/ Please advise, thank you/

## 2022-12-31 NOTE — Telephone Encounter (Signed)
Patty, He can get 1 more opportunity with me. He can be scheduled for my next available EUS slot with celiac block and FNA. If he no-shows again or cancels last-minute, he will not be able to have an EUS referrals by me any further and he will need to be referred elsewhere thereafter. Thanks. GM

## 2023-01-01 ENCOUNTER — Other Ambulatory Visit: Payer: Self-pay

## 2023-01-01 ENCOUNTER — Encounter (HOSPITAL_COMMUNITY): Payer: Self-pay

## 2023-01-01 ENCOUNTER — Emergency Department (HOSPITAL_COMMUNITY): Payer: MEDICAID

## 2023-01-01 ENCOUNTER — Inpatient Hospital Stay (HOSPITAL_COMMUNITY)
Admission: EM | Admit: 2023-01-01 | Discharge: 2023-01-05 | DRG: 439 | Disposition: A | Discharge status: Home / Self Care | Payer: MEDICAID | Attending: Internal Medicine | Admitting: Internal Medicine

## 2023-01-01 DIAGNOSIS — Z8711 Personal history of peptic ulcer disease: Secondary | ICD-10-CM

## 2023-01-01 DIAGNOSIS — K859 Acute pancreatitis without necrosis or infection, unspecified: Secondary | ICD-10-CM | POA: Diagnosis not present

## 2023-01-01 DIAGNOSIS — G8929 Other chronic pain: Secondary | ICD-10-CM | POA: Diagnosis present

## 2023-01-01 DIAGNOSIS — I10 Essential (primary) hypertension: Secondary | ICD-10-CM

## 2023-01-01 DIAGNOSIS — Z811 Family history of alcohol abuse and dependence: Secondary | ICD-10-CM

## 2023-01-01 DIAGNOSIS — Z91041 Radiographic dye allergy status: Secondary | ICD-10-CM

## 2023-01-01 DIAGNOSIS — D6959 Other secondary thrombocytopenia: Secondary | ICD-10-CM | POA: Diagnosis present

## 2023-01-01 DIAGNOSIS — K862 Cyst of pancreas: Secondary | ICD-10-CM

## 2023-01-01 DIAGNOSIS — Z888 Allergy status to other drugs, medicaments and biological substances status: Secondary | ICD-10-CM

## 2023-01-01 DIAGNOSIS — D573 Sickle-cell trait: Secondary | ICD-10-CM | POA: Diagnosis present

## 2023-01-01 DIAGNOSIS — Z885 Allergy status to narcotic agent status: Secondary | ICD-10-CM

## 2023-01-01 DIAGNOSIS — K860 Alcohol-induced chronic pancreatitis: Secondary | ICD-10-CM

## 2023-01-01 DIAGNOSIS — K861 Other chronic pancreatitis: Secondary | ICD-10-CM

## 2023-01-01 DIAGNOSIS — F17290 Nicotine dependence, other tobacco product, uncomplicated: Secondary | ICD-10-CM | POA: Diagnosis present

## 2023-01-01 DIAGNOSIS — N179 Acute kidney failure, unspecified: Secondary | ICD-10-CM

## 2023-01-01 DIAGNOSIS — K2920 Alcoholic gastritis without bleeding: Secondary | ICD-10-CM

## 2023-01-01 DIAGNOSIS — F319 Bipolar disorder, unspecified: Secondary | ICD-10-CM | POA: Diagnosis present

## 2023-01-01 DIAGNOSIS — F419 Anxiety disorder, unspecified: Secondary | ICD-10-CM | POA: Diagnosis present

## 2023-01-01 DIAGNOSIS — F1910 Other psychoactive substance abuse, uncomplicated: Secondary | ICD-10-CM

## 2023-01-01 DIAGNOSIS — Z91040 Latex allergy status: Secondary | ICD-10-CM

## 2023-01-01 DIAGNOSIS — E7800 Pure hypercholesterolemia, unspecified: Secondary | ICD-10-CM | POA: Diagnosis present

## 2023-01-01 DIAGNOSIS — Z808 Family history of malignant neoplasm of other organs or systems: Secondary | ICD-10-CM

## 2023-01-01 DIAGNOSIS — R54 Age-related physical debility: Secondary | ICD-10-CM | POA: Diagnosis present

## 2023-01-01 DIAGNOSIS — K219 Gastro-esophageal reflux disease without esophagitis: Secondary | ICD-10-CM

## 2023-01-01 DIAGNOSIS — M545 Low back pain, unspecified: Secondary | ICD-10-CM | POA: Diagnosis present

## 2023-01-01 DIAGNOSIS — K852 Alcohol induced acute pancreatitis without necrosis or infection: Principal | ICD-10-CM | POA: Diagnosis present

## 2023-01-01 DIAGNOSIS — Z79899 Other long term (current) drug therapy: Secondary | ICD-10-CM

## 2023-01-01 DIAGNOSIS — Z8249 Family history of ischemic heart disease and other diseases of the circulatory system: Secondary | ICD-10-CM

## 2023-01-01 DIAGNOSIS — K863 Pseudocyst of pancreas: Secondary | ICD-10-CM

## 2023-01-01 DIAGNOSIS — F17210 Nicotine dependence, cigarettes, uncomplicated: Secondary | ICD-10-CM | POA: Diagnosis present

## 2023-01-01 DIAGNOSIS — F1410 Cocaine abuse, uncomplicated: Secondary | ICD-10-CM | POA: Diagnosis present

## 2023-01-01 DIAGNOSIS — D649 Anemia, unspecified: Secondary | ICD-10-CM | POA: Diagnosis present

## 2023-01-01 DIAGNOSIS — F1210 Cannabis abuse, uncomplicated: Secondary | ICD-10-CM | POA: Diagnosis present

## 2023-01-01 DIAGNOSIS — E872 Acidosis, unspecified: Secondary | ICD-10-CM | POA: Diagnosis present

## 2023-01-01 DIAGNOSIS — E860 Dehydration: Secondary | ICD-10-CM

## 2023-01-01 DIAGNOSIS — I480 Paroxysmal atrial fibrillation: Secondary | ICD-10-CM | POA: Diagnosis not present

## 2023-01-01 DIAGNOSIS — F1010 Alcohol abuse, uncomplicated: Secondary | ICD-10-CM | POA: Diagnosis present

## 2023-01-01 LAB — CBC
HCT: 38.3 % — ABNORMAL LOW (ref 39.0–52.0)
Hemoglobin: 11.8 g/dL — ABNORMAL LOW (ref 13.0–17.0)
MCH: 27.8 pg (ref 26.0–34.0)
MCHC: 30.8 g/dL (ref 30.0–36.0)
MCV: 90.1 fL (ref 80.0–100.0)
Platelets: 213 10*3/uL (ref 150–400)
RBC: 4.25 MIL/uL (ref 4.22–5.81)
RDW: 17.9 % — ABNORMAL HIGH (ref 11.5–15.5)
WBC: 13.7 10*3/uL — ABNORMAL HIGH (ref 4.0–10.5)
nRBC: 0 % (ref 0.0–0.2)

## 2023-01-01 LAB — BASIC METABOLIC PANEL
Anion gap: 18 — ABNORMAL HIGH (ref 5–15)
BUN: 14 mg/dL (ref 6–20)
CO2: 10 mmol/L — ABNORMAL LOW (ref 22–32)
Calcium: 9 mg/dL (ref 8.9–10.3)
Chloride: 110 mmol/L (ref 98–111)
Creatinine, Ser: 1.68 mg/dL — ABNORMAL HIGH (ref 0.61–1.24)
GFR, Estimated: 48 mL/min — ABNORMAL LOW (ref >60)
Glucose, Bld: 83 mg/dL (ref 70–99)
Potassium: 4.7 mmol/L (ref 3.5–5.1)
Sodium: 138 mmol/L (ref 135–145)

## 2023-01-01 LAB — TROPONIN I (HIGH SENSITIVITY): Troponin I (High Sensitivity): 6 ng/L (ref <18)

## 2023-01-01 LAB — LIPASE, BLOOD: Lipase: 20 U/L (ref 11–51)

## 2023-01-01 LAB — MAGNESIUM: Magnesium: 1.5 mg/dL — ABNORMAL LOW (ref 1.7–2.4)

## 2023-01-01 MED ORDER — FERROUS SULFATE 325 (65 FE) MG PO TABS
325.0000 mg | ORAL_TABLET | Freq: Every day | ORAL | Status: DC
  Administered 2023-01-02 – 2023-01-05 (×5): 325 mg via ORAL
  Filled 2023-01-01 (×6): qty 1

## 2023-01-01 MED ORDER — HYDROMORPHONE HCL 1 MG/ML IJ SOLN
1.0000 mg | INTRAMUSCULAR | Status: DC | PRN
  Administered 2023-01-02 (×3): 1 mg via INTRAVENOUS
  Filled 2023-01-01 (×3): qty 1

## 2023-01-01 MED ORDER — LOPERAMIDE HCL 2 MG PO CAPS
4.0000 mg | ORAL_CAPSULE | ORAL | Status: DC | PRN
  Administered 2023-01-02: 4 mg via ORAL
  Filled 2023-01-01: qty 2

## 2023-01-01 MED ORDER — SUCRALFATE 1 GM/10ML PO SUSP
1.0000 g | Freq: Three times a day (TID) | ORAL | Status: DC
  Administered 2023-01-02 – 2023-01-05 (×14): 1 g via ORAL
  Filled 2023-01-01 (×14): qty 10

## 2023-01-01 MED ORDER — LACTATED RINGERS IV SOLN: INTRAVENOUS | Status: AC

## 2023-01-01 MED ORDER — PANTOPRAZOLE SODIUM 40 MG IV SOLR
40.0000 mg | Freq: Once | INTRAVENOUS | Status: AC
  Administered 2023-01-01: 40 mg via INTRAVENOUS
  Filled 2023-01-01: qty 10

## 2023-01-01 MED ORDER — LORAZEPAM 1 MG PO TABS: 1.0000 mg | ORAL_TABLET | ORAL | Status: AC | PRN

## 2023-01-01 MED ORDER — PANTOPRAZOLE SODIUM 40 MG PO TBEC
40.0000 mg | DELAYED_RELEASE_TABLET | Freq: Every day | ORAL | Status: DC
  Administered 2023-01-02 – 2023-01-05 (×4): 40 mg via ORAL
  Filled 2023-01-01 (×4): qty 1

## 2023-01-01 MED ORDER — FOLIC ACID 1 MG PO TABS
1.0000 mg | ORAL_TABLET | Freq: Every day | ORAL | Status: DC
  Administered 2023-01-02 – 2023-01-05 (×4): 1 mg via ORAL
  Filled 2023-01-01 (×4): qty 1

## 2023-01-01 MED ORDER — MORPHINE SULFATE (PF) 2 MG/ML IV SOLN
2.0000 mg | INTRAVENOUS | Status: DC | PRN
  Administered 2023-01-02 (×2): 2 mg via INTRAVENOUS
  Filled 2023-01-01 (×2): qty 1

## 2023-01-01 MED ORDER — LACTATED RINGERS IV BOLUS
1000.0000 mL | Freq: Once | INTRAVENOUS | Status: AC
  Administered 2023-01-01: 1000 mL via INTRAVENOUS

## 2023-01-01 MED ORDER — MAGNESIUM SULFATE 2 GM/50ML IV SOLN
2.0000 g | Freq: Once | INTRAVENOUS | Status: AC
  Administered 2023-01-01: 2 g via INTRAVENOUS
  Filled 2023-01-01: qty 50

## 2023-01-01 MED ORDER — METOPROLOL SUCCINATE ER 50 MG PO TB24
200.0000 mg | ORAL_TABLET | Freq: Every day | ORAL | Status: DC
  Administered 2023-01-03 – 2023-01-05 (×3): 200 mg via ORAL
  Filled 2023-01-01 (×4): qty 4

## 2023-01-01 MED ORDER — HYDROMORPHONE HCL 1 MG/ML IJ SOLN
1.0000 mg | Freq: Once | INTRAMUSCULAR | Status: AC
  Administered 2023-01-01: 1 mg via INTRAVENOUS
  Filled 2023-01-01: qty 1

## 2023-01-01 MED ORDER — PANCRELIPASE (LIP-PROT-AMYL) 36000-114000 UNITS PO CPEP
72000.0000 [IU] | ORAL_CAPSULE | Freq: Three times a day (TID) | ORAL | Status: DC
  Administered 2023-01-02 – 2023-01-05 (×10): 72000 [IU] via ORAL
  Filled 2023-01-01 (×11): qty 2

## 2023-01-01 MED ORDER — LORAZEPAM 2 MG/ML IJ SOLN: 1.0000 mg | INTRAMUSCULAR | Status: AC | PRN

## 2023-01-01 MED ORDER — ENOXAPARIN SODIUM 40 MG/0.4ML IJ SOSY
40.0000 mg | PREFILLED_SYRINGE | INTRAMUSCULAR | Status: DC
  Administered 2023-01-02 – 2023-01-04 (×4): 40 mg via SUBCUTANEOUS
  Filled 2023-01-01 (×5): qty 0.4

## 2023-01-01 MED ORDER — FAMOTIDINE 20 MG PO TABS
20.0000 mg | ORAL_TABLET | Freq: Two times a day (BID) | ORAL | Status: DC
  Administered 2023-01-02 – 2023-01-05 (×8): 20 mg via ORAL
  Filled 2023-01-01 (×8): qty 1

## 2023-01-01 MED ORDER — MORPHINE SULFATE (PF) 4 MG/ML IV SOLN
8.0000 mg | Freq: Once | INTRAVENOUS | Status: AC
  Administered 2023-01-01: 8 mg via INTRAVENOUS
  Filled 2023-01-01: qty 2

## 2023-01-01 MED ORDER — PREGABALIN 100 MG PO CAPS
100.0000 mg | ORAL_CAPSULE | Freq: Two times a day (BID) | ORAL | Status: DC
  Administered 2023-01-02 – 2023-01-05 (×8): 100 mg via ORAL
  Filled 2023-01-01 (×8): qty 1

## 2023-01-01 MED ORDER — ONDANSETRON HCL 4 MG/2ML IJ SOLN
4.0000 mg | Freq: Once | INTRAMUSCULAR | Status: AC
  Administered 2023-01-01: 4 mg via INTRAVENOUS
  Filled 2023-01-01: qty 2

## 2023-01-01 MED ORDER — ALUM & MAG HYDROXIDE-SIMETH 200-200-20 MG/5ML PO SUSP
30.0000 mL | Freq: Once | ORAL | Status: AC
  Administered 2023-01-01: 30 mL via ORAL
  Filled 2023-01-01: qty 30

## 2023-01-01 MED ORDER — PANCRELIPASE (LIP-PROT-AMYL) 36000-114000 UNITS PO CPEP: 36000.0000 [IU] | ORAL_CAPSULE | Freq: Three times a day (TID) | ORAL | Status: DC | PRN

## 2023-01-01 MED ORDER — ALBUTEROL SULFATE (2.5 MG/3ML) 0.083% IN NEBU: 2.5000 mg | INHALATION_SOLUTION | RESPIRATORY_TRACT | Status: DC | PRN

## 2023-01-01 NOTE — H&P (Signed)
History and Physical    Patient: [name] ZOX:[id number] DOB: 10/09/1970 DOA: 01/01/2023 DOS: the patient was seen and examined on 01/01/2023 PCP: Pcp, No  Patient coming from: Home  Chief Complaint:  Chief Complaint  Patient presents with   Chest Pain   HPI: [name] is a 52 y.o. male with medical history significant of Paroxysmal atrial fibrillation not on on anticoagulation due to substance abuse/nonhearing, WPW s/p ablation, alcohol associated chronic pancreatitis, pancreatic pseudocyst, hypertension, GERD, hyperlipidemia, bipolar disorder, depression and anxiety, cocaine use, hx of seizure (electrolyte abnormalities from alcohol/cocaine use) who presents with abdominal pain and diarrhea.  He started to have persistent diarrhea as well as vomiting last evening.  This morning developed severe mid epigastric sharp pain radiating to right upper quadrant.  Feels similar to prior pancreatitis.  He tried to take Imodium without relief.  Reports last alcohol use of two 24 ounce beers yesterday and had fatty food which she believes provoked his symptoms.  Also had marijuana use yesterday.  Reports last cocaine use at least 72 hours ago.  Yesterday he also felt like he was about to have a seizure but did not have one.  He has history of seizure in the past in the setting of alcohol/cocaine use.  Says that upon discharge he will go to [facility] for alcohol cessation.  Wants to see social worker for inpatient to get more assistance  In the ED, he is afebrile normotensive on room air.  CBC with leukocytosis of 13.7, hemoglobin appears concentrated at 11.9 with hematocrit of 38 from a previous baseline of around 9-10.  BMP notable for AKI with creatinine of 1.68 from prior of 1.04.  Also has metabolic acidosis with CO2 of 10 and bicarb of 18.  LFTs are within normal limits.  Lipase is 20.  Troponin is a 6 EKG reassuring and similar to prior.  Chest x-ray is negative.  No  repeat CT imaging was performed today since he has had numerous CT abdomen pelvis monthly since March all demonstrating chronic pancreatitis with pseudocysts.    Hospitalist was consulted for admission of acute on chronic pancreatitis with persistent pain and metabolic acidosis.  Review of Systems: As mentioned in the history of present illness. All other systems reviewed and are negative. Past Medical History:  Diagnosis Date   Alcoholism /alcohol abuse    Anemia    Anxiety    Arthritis    knees; arms; elbows (03/26/2015)   Asthma    Bipolar disorder (HCC)    Chronic bronchitis (HCC)    Chronic lower back pain    Chronic pancreatitis (HCC)    Cocaine abuse (HCC)    Depression    Family history of adverse reaction to anesthesia    Femoral condyle fracture (HCC) 03/08/2014   left medial/notes 03/09/2014   GERD (gastroesophageal reflux disease)    H/O hiatal hernia    H/O suicide attempt 10/2012   High cholesterol    History of blood transfusion 10/2012   when I tried to commit suicide   History of stomach ulcers    Hypertension    Marijuana abuse, continuous    Migraine    a few times/year (03/26/2015)   Pneumonia 1990's X 3   PTSD (post-traumatic stress disorder)    Seizures (HCC)    Sickle cell trait (HCC)    WPW (Wolff-Parkinson-White syndrome)    [name] 03/06/2013   Past Surgical History:  Procedure Laterality Date   BIOPSY  11/25/2017  Procedure: BIOPSY;  Surgeon: [name] Modena, MD;  Location: [facility] ENDOSCOPY;  Service: Endoscopy;;   BIOPSY  10/14/2018   Procedure: BIOPSY;  Surgeon: [name] Modena, MD;  Location: [facility] ENDOSCOPY;  Service: Endoscopy;;   CARDIAC CATHETERIZATION     CYST ENTEROSTOMY  01/02/2020   Procedure: CYST ASPIRATION;  Surgeon: Rachael Fee, MD;  Location: [facility] ENDOSCOPY;  Service: Endoscopy;;   ESOPHAGOGASTRODUODENOSCOPY (EGD) WITH PROPOFOL N/A 11/25/2017   Procedure: ESOPHAGOGASTRODUODENOSCOPY (EGD) WITH PROPOFOL;  Surgeon: [name] Modena, MD;   Location: [facility] ENDOSCOPY;  Service: Endoscopy;  Laterality: N/A;   ESOPHAGOGASTRODUODENOSCOPY (EGD) WITH PROPOFOL Left 10/14/2018   Procedure: ESOPHAGOGASTRODUODENOSCOPY (EGD) WITH PROPOFOL;  Surgeon: [name] Modena, MD;  Location: [facility] ENDOSCOPY;  Service: Endoscopy;  Laterality: Left;   ESOPHAGOGASTRODUODENOSCOPY (EGD) WITH PROPOFOL N/A 11/14/2018   Procedure: ESOPHAGOGASTRODUODENOSCOPY (EGD) WITH PROPOFOL;  Surgeon: Carman Finas Delone, MD;  Location: [facility] ENDOSCOPY;  Service: Gastroenterology;  Laterality: N/A;   ESOPHAGOGASTRODUODENOSCOPY (EGD) WITH PROPOFOL N/A 01/02/2020   Procedure: ESOPHAGOGASTRODUODENOSCOPY (EGD) WITH PROPOFOL;  Surgeon: Rachael Fee, MD;  Location: [facility] ENDOSCOPY;  Service: Endoscopy;  Laterality: N/A;   ESOPHAGOGASTRODUODENOSCOPY (EGD) WITH PROPOFOL N/A 10/25/2020   Procedure: ESOPHAGOGASTRODUODENOSCOPY (EGD) WITH PROPOFOL;  Surgeon: Meridee Score Netty Starring., MD;  Location: [facility] ENDOSCOPY;  Service: Gastroenterology;  Laterality: N/A;   EUS N/A 01/02/2020   Procedure: UPPER ENDOSCOPIC ULTRASOUND (EUS) RADIAL;  Surgeon: Rachael Fee, MD;  Location: [facility] ENDOSCOPY;  Service: Endoscopy;  Laterality: N/A;   EYE SURGERY Left 1990's   "result of trauma"    FACIAL FRACTURE SURGERY Left 1990's   "result of trauma"    FLEXIBLE SIGMOIDOSCOPY N/A 10/25/2020   Procedure: FLEXIBLE SIGMOIDOSCOPY;  Surgeon: Lemar Lofty., MD;  Location: [facility] ENDOSCOPY;  Service: Gastroenterology;  Laterality: N/A;   FRACTURE SURGERY     HEMOSTASIS CLIP PLACEMENT  10/25/2020   Procedure: HEMOSTASIS CLIP PLACEMENT;  Surgeon: Lemar Lofty., MD;  Location: [facility] ENDOSCOPY;  Service: Gastroenterology;;   HERNIA REPAIR     LEFT HEART CATHETERIZATION WITH CORONARY ANGIOGRAM Right 03/07/2013   Procedure: LEFT HEART CATHETERIZATION WITH CORONARY ANGIOGRAM;  Surgeon: Ricki Rodriguez, MD;  Location: [facility] CATH LAB;  Service: Cardiovascular;  Laterality: Right;   UMBILICAL HERNIA REPAIR     UPPER GASTROINTESTINAL  ENDOSCOPY     Social History:  reports that he has been smoking cigarettes and e-cigarettes. He has a 36 pack-year smoking history. He has never used smokeless tobacco. He reports current alcohol use. He reports that he does not currently use drugs after having used the following drugs: Marijuana and Cocaine.  Allergies  Allergen Reactions   Robaxin [Methocarbamol] Other (See Comments)    jumpy limbs   Aspirin Other (See Comments)    Unknown reaction   Shellfish-Derived Products Nausea And Vomiting and Rash   Trazodone And Nefazodone Other (See Comments)    Muscle spasms   Adhesive [Tape] Itching   Latex Itching   Toradol [Ketorolac Tromethamine] Other (See Comments)    Has ulcers; cannot have this   Contrast Media [Iodinated Contrast Media] Hives   Reglan [Metoclopramide] Other (See Comments)    Muscle spasms   Salmon [Fish Oil] Nausea And Vomiting and Rash    Family History  Problem Relation Age of Onset   Hypertension Mother    Cirrhosis Father    Alcoholism Father    Hypertension Father    Melanoma Father    Hypertension Other    Coronary artery disease Other     Prior to Admission medications  Medication Sig Start Date End Date Taking? Authorizing Provider  albuterol (VENTOLIN HFA) 108 (90 Base) MCG/ACT inhaler Inhale 2 puffs into the lungs every 4 (four) hours as needed for wheezing or shortness of breath. 07/23/22  Yes Ghimire, Werner Lean, MD  alum & mag hydroxide-simeth (MAALOX/MYLANTA) 200-200-20 MG/5ML suspension Take 30 mLs by mouth every 6 (six) hours as needed for indigestion or heartburn. 11/30/22  Yes Doree Albee, PA-C  famotidine (PEPCID) 20 MG tablet Take 1 tablet (20 mg total) by mouth 2 (two) times daily. 12/17/22  Yes Fayrene Helper, PA-C  ferrous sulfate 325 (65 FE) MG tablet Take 325 mg by mouth daily. 05/11/21  Yes [provider]  fluticasone (FLONASE) 50 MCG/ACT nasal spray Place 2 sprays into both nostrils in the morning, at noon, and at  bedtime.   Yes [provider]  folic acid (FOLVITE) 1 MG tablet Take 1 tablet (1 mg total) by mouth daily. 09/21/22  Yes Pokhrel, Laxman, MD  lidocaine (XYLOCAINE) 2 % solution Use as directed 15 mLs in the mouth or throat every 6 (six) hours as needed for mouth pain. 12/17/22  Yes Fayrene Helper, PA-C  lipase/protease/amylase (CREON) 36000 UNITS CPEP capsule Take 2 capsules (72,000 Units total) by mouth 3 (three) times daily with meals. May also take 1 capsule (36,000 Units total) as needed (with snacks). 09/21/22  Yes Pokhrel, Laxman, MD  loperamide (IMODIUM) 2 MG capsule Take 2 capsules (4 mg total) by mouth as needed for diarrhea or loose stools. 11/30/22  Yes Quentin Mulling R, PA-C  metoprolol (TOPROL-XL) 200 MG 24 hr tablet Take 200 mg by mouth daily. 11/30/22  Yes [provider]  Multiple Vitamin (MULTIVITAMIN WITH MINERALS) TABS tablet Take 1 tablet by mouth daily. Patient taking differently: Take 2 tablets by mouth daily. 07/23/22  Yes Ghimire, Werner Lean, MD  nitroGLYCERIN (NITROSTAT) 0.4 MG SL tablet Place 0.4 mg under the tongue every 5 (five) minutes as needed for chest pain.   Yes [provider]  ondansetron (ZOFRAN-ODT) 4 MG disintegrating tablet Take 1 tablet (4 mg total) by mouth 2 (two) times daily as needed for nausea or vomiting. 09/21/22  Yes Pokhrel, Laxman, MD  pantoprazole (PROTONIX) 40 MG tablet Take 1 tablet (40 mg total) by mouth daily. 11/30/22  Yes Quentin Mulling R, PA-C  pregabalin (LYRICA) 100 MG capsule Take 1 capsule (100 mg total) by mouth 2 (two) times daily. 11/30/22  Yes Quentin Mulling R, PA-C  sucralfate (CARAFATE) 1 GM/10ML suspension Take 10 mLs (1 g total) by mouth See admin instructions. Take 4 times with meals and at bedtime. 11/30/22  Yes Quentin Mulling R, PA-C  Vitamin D, Ergocalciferol, (DRISDOL) 1.25 MG (50000 UNIT) CAPS capsule Take 1 capsule (50,000 Units total) by mouth every Wednesday. Patient taking differently: Take 50,000 Units  by mouth once a week. 07/27/22  Yes Ghimire, Werner Lean, MD  amitriptyline (ELAVIL) 25 MG tablet Take 1 tablet (25 mg total) by mouth at bedtime. Patient not taking: Reported on 08/08/2019 10/15/18 08/08/19  Rolly Salter, MD    Physical Exam: Vitals:   01/01/23 1915 01/01/23 2000 01/01/23 2115 01/01/23 2133  BP:  124/89 (!) 143/94   Pulse: 84 (!) 54    Resp: 20 (!) 21 17   Temp:    (!) 97.3 F (36.3 C)  TempSrc:    Oral  SpO2: 100% (!) 81%    Weight:      Height:       Constitutional: NAD, calm,  comfortable, thin middle-age male sitting up in bed Eyes: lids and conjunctivae normal ENMT: Mucous membranes are moist.  Neck: normal, supple Respiratory: clear to auscultation bilaterally, no wheezing, no crackles. Normal respiratory effort. No accessory muscle use.  Cardiovascular: Regular rate and rhythm, no murmurs / rubs / gallops. No extremity edema.  Abdomen: Soft, nondistended, diffuse mild tenderness.  No rebound tenderness, guarding or rigidity.  Musculoskeletal: no clubbing / cyanosis. No joint deformity upper and lower extremities. Good ROM, no contractures. Normal muscle tone.  Skin: no rashes, lesions, ulcers. No induration Neurologic: CN 2-12 grossly intact.  Strength 5/5 in all 4.  Psychiatric: Normal judgment and insight. Alert and oriented x 3. Normal mood. Data Reviewed:  See HPI  Assessment and Plan: Acute on chronic pancreatitis (HCC) - History of pancreatic pseudocyst and chronic pancreatitis secondary to alcohol abuse.  Patient presents send all times monthly for the same.  Has had monthly CT abdomen and pelvis and will defer any imaging today as his symptoms align again with acute on chronic pancreatitis -Continue aggressive IV fluid hydration -prn IV opioids for pain -Full liquid diet and can advance as tolerated -Strongly encourage alcohol cessation.  Patient reports that he will go to [facility] upon discharge.  Requested social work consult for assistance with  resources which I have placed. -continue Creon  AKI (acute kidney injury) (HCC) Metabolic acidosis with elevated anion gap -Secondary to alcohol use and dehydration - Continue IV fluid hydration and follow creatinine trend  Paroxysmal atrial fibrillation (HCC) - Continue metoprolol - Not on anticoagulation due to nonadherence and polysubstance abuse  Hypomagnesemia - Replenish with IV magnesium supplementation  Gastroesophageal reflux disease Continue home PPI and famotidine  Polysubstance abuse (tobacco, cocaine, THC, and ETOH) - Patient reports last marijuana and alcohol use (two 24 oz beer which he reports is less than his usual amount) yesterday.  Cocaine use greater than 72 hours ago. -place on CIWA protocol  Essential hypertension - Controlled - Continue metoprolol      Advance Care Planning: Full  Consults: none  Family Communication: none at bedside  Severity of Illness: The appropriate patient status for this patient is OBSERVATION. Observation status is judged to be reasonable and necessary in order to provide the required intensity of service to ensure the patient's safety. The patient's presenting symptoms, physical exam findings, and initial radiographic and laboratory data in the context of their medical condition is felt to place them at decreased risk for further clinical deterioration. Furthermore, it is anticipated that the patient will be medically stable for discharge from the hospital within 2 midnights of admission.   Author: Anselm Jungling, DO 01/01/2023 10:14 PM  For on call review [email or web address].

## 2023-01-01 NOTE — Assessment & Plan Note (Signed)
Controlled.  Continue metoprolol ?

## 2023-01-01 NOTE — Assessment & Plan Note (Signed)
-   Patient reports last marijuana and alcohol use (two 24 oz beer which he reports is less than his usual amount) yesterday.  Cocaine use greater than 72 hours ago. -place on CIWA protocol

## 2023-01-01 NOTE — Assessment & Plan Note (Signed)
Continue home PPI and famotidine

## 2023-01-01 NOTE — ED Notes (Addendum)
ED TO INPATIENT HANDOFF REPORT  ED Nurse Name and Phone #: Juliette Alcide RN [phone]  S Name/Age/Gender [name] 52 y.o. male Room/Bed: 010C/010C  Code Status   Code Status: Full Code  Home/SNF/Other Home Patient oriented to: self, place, time, and situation Is this baseline? Yes   Triage Complete: Triage complete  Chief Complaint Acute on chronic pancreatitis (HCC) [K85.90, K86.1]  Triage Note Pt BIB [facility]  c/o CP that is located to lower sternum chest with radiation around L side to back. Pain is described as stabbing and burning. Pt states it hurts to breathe and move about. Symptoms started while he was out fishing. Pt has associated vomiting and watery diarrhea. Pt states he had a similar episode 2 weeks ago and was diagnosed with pancreatitis. Pt reports ETOH use last night.   EMS report pt took Viagra at 10:30 this AM and NTG was contraindicated. Pt also was not given ASA due to hx of PUD.   EMS Vitals   98/64 HR 89    Allergies Allergies  Allergen Reactions   Robaxin [Methocarbamol] Other (See Comments)    jumpy limbs   Aspirin Other (See Comments)    Unknown reaction   Shellfish-Derived Products Nausea And Vomiting and Rash   Trazodone And Nefazodone Other (See Comments)    Muscle spasms   Adhesive [Tape] Itching   Latex Itching   Toradol [Ketorolac Tromethamine] Other (See Comments)    Has ulcers; cannot have this   Contrast Media [Iodinated Contrast Media] Hives   Reglan [Metoclopramide] Other (See Comments)    Muscle spasms   Salmon [Fish Oil] Nausea And Vomiting and Rash    Level of Care/Admitting Diagnosis ED Disposition     ED Disposition  Admit   Condition  --   Comment  Hospital Area: [facility] [[id number]]  Level of Care: Telemetry Medical [104]  May place patient in observation at [facility] or [facility] if equivalent level of care is available:: No  Covid Evaluation: Asymptomatic - no recent exposure (last 10  days) testing not required  Diagnosis: Acute on chronic pancreatitis [facility]) [[id number]]  Admitting Physician: Anselm Jungling [[id number]]  Attending Physician: Anselm Jungling [[id number]]          B Medical/Surgery History Past Medical History:  Diagnosis Date   Alcoholism /alcohol abuse    Anemia    Anxiety    Arthritis    knees; arms; elbows (03/26/2015)   Asthma    Bipolar disorder (HCC)    Chronic bronchitis (HCC)    Chronic lower back pain    Chronic pancreatitis (HCC)    Cocaine abuse (HCC)    Depression    Family history of adverse reaction to anesthesia    Femoral condyle fracture (HCC) 03/08/2014   left medial/notes 03/09/2014   GERD (gastroesophageal reflux disease)    H/O hiatal hernia    H/O suicide attempt 10/2012   High cholesterol    History of blood transfusion 10/2012   when I tried to commit suicide   History of stomach ulcers    Hypertension    Marijuana abuse, continuous    Migraine    a few times/year (03/26/2015)   Pneumonia 1990's X 3   PTSD (post-traumatic stress disorder)    Seizures (HCC)    Sickle cell trait (HCC)    WPW (Wolff-Parkinson-White syndrome)    [name] 03/06/2013   Past Surgical History:  Procedure Laterality Date   BIOPSY  11/25/2017  Procedure: BIOPSY;  Surgeon: [name] Modena, MD;  Location: [facility] ENDOSCOPY;  Service: Endoscopy;;   BIOPSY  10/14/2018   Procedure: BIOPSY;  Surgeon: [name] Modena, MD;  Location: [facility] ENDOSCOPY;  Service: Endoscopy;;   CARDIAC CATHETERIZATION     CYST ENTEROSTOMY  01/02/2020   Procedure: CYST ASPIRATION;  Surgeon: Rachael Fee, MD;  Location: [facility] ENDOSCOPY;  Service: Endoscopy;;   ESOPHAGOGASTRODUODENOSCOPY (EGD) WITH PROPOFOL N/A 11/25/2017   Procedure: ESOPHAGOGASTRODUODENOSCOPY (EGD) WITH PROPOFOL;  Surgeon: [name] Modena, MD;  Location: [facility] ENDOSCOPY;  Service: Endoscopy;  Laterality: N/A;   ESOPHAGOGASTRODUODENOSCOPY (EGD) WITH PROPOFOL Left 10/14/2018   Procedure: ESOPHAGOGASTRODUODENOSCOPY (EGD) WITH  PROPOFOL;  Surgeon: [name] Modena, MD;  Location: [facility] ENDOSCOPY;  Service: Endoscopy;  Laterality: Left;   ESOPHAGOGASTRODUODENOSCOPY (EGD) WITH PROPOFOL N/A 11/14/2018   Procedure: ESOPHAGOGASTRODUODENOSCOPY (EGD) WITH PROPOFOL;  Surgeon: Carman Ching, MD;  Location: [facility] ENDOSCOPY;  Service: Gastroenterology;  Laterality: N/A;   ESOPHAGOGASTRODUODENOSCOPY (EGD) WITH PROPOFOL N/A 01/02/2020   Procedure: ESOPHAGOGASTRODUODENOSCOPY (EGD) WITH PROPOFOL;  Surgeon: Rachael Fee, MD;  Location: [facility] ENDOSCOPY;  Service: Endoscopy;  Laterality: N/A;   ESOPHAGOGASTRODUODENOSCOPY (EGD) WITH PROPOFOL N/A 10/25/2020   Procedure: ESOPHAGOGASTRODUODENOSCOPY (EGD) WITH PROPOFOL;  Surgeon: Meridee Score Netty Starring., MD;  Location: [facility] ENDOSCOPY;  Service: Gastroenterology;  Laterality: N/A;   EUS N/A 01/02/2020   Procedure: UPPER ENDOSCOPIC ULTRASOUND (EUS) RADIAL;  Surgeon: Rachael Fee, MD;  Location: [facility] ENDOSCOPY;  Service: Endoscopy;  Laterality: N/A;   EYE SURGERY Left 1990's   "result of trauma"    FACIAL FRACTURE SURGERY Left 1990's   "result of trauma"    FLEXIBLE SIGMOIDOSCOPY N/A 10/25/2020   Procedure: FLEXIBLE SIGMOIDOSCOPY;  Surgeon: Lemar Lofty., MD;  Location: [facility] ENDOSCOPY;  Service: Gastroenterology;  Laterality: N/A;   FRACTURE SURGERY     HEMOSTASIS CLIP PLACEMENT  10/25/2020   Procedure: HEMOSTASIS CLIP PLACEMENT;  Surgeon: Lemar Lofty., MD;  Location: [facility] ENDOSCOPY;  Service: Gastroenterology;;   HERNIA REPAIR     LEFT HEART CATHETERIZATION WITH CORONARY ANGIOGRAM Right 03/07/2013   Procedure: LEFT HEART CATHETERIZATION WITH CORONARY ANGIOGRAM;  Surgeon: Ricki Rodriguez, MD;  Location: [facility] CATH LAB;  Service: Cardiovascular;  Laterality: Right;   UMBILICAL HERNIA REPAIR     UPPER GASTROINTESTINAL ENDOSCOPY       A IV Location/Drains/Wounds Patient Lines/Drains/Airways Status     Active Line/Drains/Airways     Name Placement date Placement time Site Days    Peripheral IV 01/01/23 20 G Posterior;Right Forearm 01/01/23  1828  Forearm  less than 1   Peripheral IV 01/01/23 22 G Right Hand 01/01/23  2136  Hand  less than 1            Intake/Output Last 24 hours No intake or output data in the 24 hours ending 01/01/23 2206  Labs/Imaging Results for orders placed or performed during the hospital encounter of 01/01/23 (from the past 48 hour(s))  Basic metabolic panel     Status: Abnormal   Collection Time: 01/01/23  6:41 PM  Result Value Ref Range   Sodium 138 135 - 145 mmol/L   Potassium 4.7 3.5 - 5.1 mmol/L   Chloride 110 98 - 111 mmol/L   CO2 10 (L) 22 - 32 mmol/L   Glucose, Bld 83 70 - 99 mg/dL    Comment: Glucose reference range applies only to samples taken after fasting for at least 8 hours.   BUN 14 6 - 20 mg/dL   Creatinine, Ser 1.19 (H) 0.61 - 1.24 mg/dL  Calcium 9.0 8.9 - 10.3 mg/dL   GFR, Estimated 48 (L) >60 mL/min    Comment: (NOTE) Calculated using the CKD-EPI Creatinine Equation (2021)    Anion gap 18 (H) 5 - 15    Comment: Performed at [facility], [address] [place], [place] [address]  CBC     Status: Abnormal   Collection Time: 01/01/23  6:41 PM  Result Value Ref Range   WBC 13.7 (H) 4.0 - 10.5 K/uL   RBC 4.25 4.22 - 5.81 MIL/uL   Hemoglobin 11.8 (L) 13.0 - 17.0 g/dL   HCT 32.9 (L) 51.8 - 84.1 %   MCV 90.1 80.0 - 100.0 fL   MCH 27.8 26.0 - 34.0 pg   MCHC 30.8 30.0 - 36.0 g/dL   RDW 66.0 (H) 63.0 - 16.0 %   Platelets 213 150 - 400 K/uL   nRBC 0.0 0.0 - 0.2 %    Comment: Performed at [facility], [address] [place], [place] [address]  Troponin I (High Sensitivity)     Status: None   Collection Time: 01/01/23  6:41 PM  Result Value Ref Range   Troponin I (High Sensitivity) 6 <18 ng/L    Comment: (NOTE) Elevated high sensitivity troponin I (hsTnI) values and significant  changes across serial measurements may suggest ACS but many other  chronic and acute conditions are known to elevate  hsTnI results.  Refer to the "Links" section for chest pain algorithms and additional  guidance. Performed at [facility], [address], [place], [place] [address]   Lipase, blood     Status: None   Collection Time: 01/01/23  6:41 PM  Result Value Ref Range   Lipase 20 11 - 51 U/L    Comment: Performed at [facility], [address], [place], [place] [address]  Magnesium     Status: Abnormal   Collection Time: 01/01/23  6:41 PM  Result Value Ref Range   Magnesium 1.5 (L) 1.7 - 2.4 mg/dL    Comment: Performed at [facility], [address], [place], [place] [address]   DG Chest 2 View  Result Date: 01/01/2023 CLINICAL DATA:  Chest pain EXAM: CHEST - 2 VIEW COMPARISON:  None Available. FINDINGS: The heart size and mediastinal contours are within normal limits. No consolidation, pneumothorax or effusion. No edema. The visualized skeletal structures are unremarkable. Overlapping cardiac leads. Air-fluid level along the stomach beneath the left hemidiaphragm. IMPRESSION: No acute cardiopulmonary disease Electronically Signed   By: Karen Kays M.D.   On: 01/01/2023 18:32    Pending Labs Unresulted Labs (From admission, onward)     Start     Ordered   01/02/23 0500  CBC  Tomorrow morning,   R        01/01/23 2148   01/02/23 0500  Basic metabolic panel  Tomorrow morning,   R        01/01/23 2148   01/01/23 2148  HIV Antibody (routine testing w rflx)  (HIV Antibody (Routine testing w reflex) panel)  Once,   R        01/01/23 2148            Vitals/Pain Today's Vitals   01/01/23 1915 01/01/23 2000 01/01/23 2115 01/01/23 2133  BP:  124/89 (!) 143/94   Pulse: 84 (!) 54    Resp: 20 (!) 21 17   Temp:    (!) 97.3 F (36.3 C)  TempSrc:  Oral  SpO2: 100% (!) 81%    Weight:      Height:      PainSc:        Isolation Precautions No active isolations  Medications Medications  magnesium sulfate IVPB 2 g 50 mL (2 g Intravenous New Bag/Given 01/01/23 2150)   enoxaparin (LOVENOX) injection 40 mg (has no administration in time range)  metoprolol succinate (TOPROL-XL) 24 hr tablet 200 mg (has no administration in time range)  famotidine (PEPCID) tablet 20 mg (has no administration in time range)  lipase/protease/amylase (CREON) capsule 72,000 Units (has no administration in time range)    And  lipase/protease/amylase (CREON) capsule 36,000 Units (has no administration in time range)  pantoprazole (PROTONIX) EC tablet 40 mg (has no administration in time range)  sucralfate (CARAFATE) 1 GM/10ML suspension 1 g (has no administration in time range)  ferrous sulfate tablet 325 mg (has no administration in time range)  folic acid (FOLVITE) tablet 1 mg (has no administration in time range)  pregabalin (LYRICA) capsule 100 mg (has no administration in time range)  albuterol (PROVENTIL) (2.5 MG/3ML) 0.083% nebulizer solution 2.5 mg (has no administration in time range)  lactated ringers infusion (has no administration in time range)  morphine (PF) 2 MG/ML injection 2 mg (has no administration in time range)  HYDROmorphone (DILAUDID) injection 1 mg (has no administration in time range)  LORazepam (ATIVAN) tablet 1-4 mg (has no administration in time range)    Or  LORazepam (ATIVAN) injection 1-4 mg (has no administration in time range)  lactated ringers bolus 1,000 mL (1,000 mLs Intravenous New Bag/Given 01/01/23 1833)  morphine (PF) 4 MG/ML injection 8 mg (8 mg Intravenous Given 01/01/23 1833)  alum & mag hydroxide-simeth (MAALOX/MYLANTA) 200-200-20 MG/5ML suspension 30 mL (30 mLs Oral Given 01/01/23 2151)  pantoprazole (PROTONIX) injection 40 mg (40 mg Intravenous Given 01/01/23 1836)  ondansetron (ZOFRAN) injection 4 mg (4 mg Intravenous Given 01/01/23 2140)  lactated ringers bolus 1,000 mL (1,000 mLs Intravenous New Bag/Given 01/01/23 2149)  HYDROmorphone (DILAUDID) injection 1 mg (1 mg Intravenous Given 01/01/23 2143)    Mobility walks     Focused  Assessments Cardiac Assessment Handoff:  Cardiac Rhythm: Normal sinus rhythm Lab Results  Component Value Date   CKTOTAL 1,037 (H) 07/22/2022   CKMB 3.0 10/03/2011   TROPONINI <0.03 10/13/2018   Lab Results  Component Value Date   DDIMER 0.54 (H) 12/06/2021   Does the Patient currently have chest pain? No   , Neuro Assessment Handoff:  Swallow screen pass? Yes  Cardiac Rhythm: Normal sinus rhythm       Neuro Assessment:   Neuro Checks:      Has TPA been given? No If patient is a Neuro Trauma and patient is going to OR before floor call report to 4N Charge nurse: [phone] or [phone]   R Recommendations: See Admitting Provider Note  Report given to:   Additional Notes: Pt A&Ox4. Ambulatory. Continent.

## 2023-01-01 NOTE — Assessment & Plan Note (Signed)
-   Continue metoprolol - Not on anticoagulation due to nonadherence and polysubstance abuse

## 2023-01-01 NOTE — Assessment & Plan Note (Signed)
-   Replenish with IV magnesium supplementation

## 2023-01-01 NOTE — Assessment & Plan Note (Signed)
Metabolic acidosis with elevated anion gap -Secondary to alcohol use and dehydration - Continue IV fluid hydration and follow creatinine trend

## 2023-01-01 NOTE — ED Triage Notes (Addendum)
Pt BIB [facility]  c/o CP that is located to lower sternum chest with radiation around L side to back. Pain is described as stabbing and burning. Pt states it hurts to breathe and move about. Symptoms started while he was out fishing. Pt has associated vomiting and watery diarrhea. Pt states he had a similar episode 2 weeks ago and was diagnosed with pancreatitis. Pt reports ETOH use last night.   EMS report pt took Viagra at 10:30 this AM and NTG was contraindicated. Pt also was not given ASA due to hx of PUD.   EMS Vitals   98/64 HR 89

## 2023-01-01 NOTE — Assessment & Plan Note (Addendum)
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  Patient presents send all times monthly for the same.  Has had monthly CT abdomen and pelvis and will defer any imaging today as his symptoms align again with acute on chronic pancreatitis -Continue aggressive IV fluid hydration -prn IV opioids for pain -Full liquid diet and can advance as tolerated -Strongly encourage alcohol cessation.  Patient reports that he will go to [facility] upon discharge.  Requested social work consult for assistance with resources which I have placed. -continue Creon

## 2023-01-01 NOTE — ED Provider Notes (Signed)
[place] EMERGENCY DEPARTMENT AT [facility] Provider Note   CSN: [id number] Arrival date & time: 01/01/23  1707     History  Chief Complaint  Patient presents with   Chest Pain    [name] is a 52 y.o. male.  HPI    52 y.o. male with medical history significant for PAF not on anticoagulation due to substance use/nonadherence, WPW s/p ablation, alcohol associated chronic pancreatitis, pancreatic cysts, HTN, HLD, bipolar disorder, depression/anxiety, cocaine use presented to the hospital with abdominal pain, nausea and diarrhea.  Patient states that he is having diarrhea yesterday.  He has had about 5-10 episodes of loose, watery bowel movements.  No blood in the stool.  This morning he went to fish.  In the afternoon he started having severe abdominal pain.  Abdominal pain is in the epigastric chest region and it radiates to the back, similar to his previous pancreatitis pain.  Patient did have couple of beers yesterday.  Home Medications Prior to Admission medications   Medication Sig Start Date End Date Taking? Authorizing Provider  albuterol (VENTOLIN HFA) 108 (90 Base) MCG/ACT inhaler Inhale 2 puffs into the lungs every 4 (four) hours as needed for wheezing or shortness of breath. 07/23/22   Ghimire, Werner Lean, MD  alum & mag hydroxide-simeth (MAALOX/MYLANTA) 200-200-20 MG/5ML suspension Take 30 mLs by mouth every 6 (six) hours as needed for indigestion or heartburn. 11/30/22   Doree Albee, PA-C  amiodarone (PACERONE) 200 MG tablet 200 mg twice daily x 1 week, then switch to 200 mg daily Patient taking differently: Take 200 mg by mouth daily. 07/23/22   Ghimire, Werner Lean, MD  carvedilol (COREG) 12.5 MG tablet Take 1 tablet (12.5 mg total) by mouth 2 (two) times daily with a meal. 09/21/22   Pokhrel, Rebekah Chesterfield, MD  chlorhexidine (PERIDEX) 0.12 % solution Use as directed 15 mLs in the mouth or throat 2 (two) times daily. 09/21/22   Pokhrel, Rebekah Chesterfield, MD   famotidine (PEPCID) 20 MG tablet Take 1 tablet (20 mg total) by mouth 2 (two) times daily. 12/17/22   Fayrene Helper, PA-C  ferrous sulfate 325 (65 FE) MG tablet Take 325 mg by mouth daily. 05/11/21   [provider]  fluticasone (FLONASE) 50 MCG/ACT nasal spray Place 2 sprays into both nostrils in the morning, at noon, and at bedtime.    [provider]  folic acid (FOLVITE) 1 MG tablet Take 1 tablet (1 mg total) by mouth daily. 09/21/22   Pokhrel, Rebekah Chesterfield, MD  lidocaine (XYLOCAINE) 2 % solution Use as directed 15 mLs in the mouth or throat every 6 (six) hours as needed for mouth pain. 12/17/22   Fayrene Helper, PA-C  lipase/protease/amylase (CREON) 36000 UNITS CPEP capsule Take 2 capsules (72,000 Units total) by mouth 3 (three) times daily with meals. May also take 1 capsule (36,000 Units total) as needed (with snacks). 09/21/22   Pokhrel, Rebekah Chesterfield, MD  loperamide (IMODIUM) 2 MG capsule Take 2 capsules (4 mg total) by mouth as needed for diarrhea or loose stools. 11/30/22   Doree Albee, PA-C  metoprolol tartrate (LOPRESSOR) 100 MG tablet Take 100 mg by mouth 2 (two) times daily.    [provider]  Multiple Vitamin (MULTIVITAMIN WITH MINERALS) TABS tablet Take 1 tablet by mouth daily. Patient taking differently: Take 2 tablets by mouth daily. 07/23/22   Ghimire, Werner Lean, MD  nicotine (NICODERM CQ - DOSED IN MG/24 HOURS) 21 mg/24hr patch Place 1 patch (21 mg  total) onto the skin at bedtime. 07/23/22   Ghimire, Werner Lean, MD  nitroGLYCERIN (NITROSTAT) 0.4 MG SL tablet Place 0.4 mg under the tongue every 5 (five) minutes as needed for chest pain.    [provider]  ondansetron (ZOFRAN-ODT) 4 MG disintegrating tablet Take 1 tablet (4 mg total) by mouth 2 (two) times daily as needed for nausea or vomiting. 09/21/22   Pokhrel, Rebekah Chesterfield, MD  pantoprazole (PROTONIX) 40 MG tablet Take 1 tablet (40 mg total) by mouth daily. 11/30/22   Doree Albee, PA-C  pregabalin (LYRICA) 100 MG  capsule Take 1 capsule (100 mg total) by mouth 2 (two) times daily. 11/30/22   Doree Albee, PA-C  promethazine (PHENERGAN) 25 MG tablet Take 25 mg by mouth as needed for nausea or vomiting. 06/30/22   [provider]  sucralfate (CARAFATE) 1 GM/10ML suspension Take 10 mLs (1 g total) by mouth See admin instructions. Take 4 times with meals and at bedtime. 11/30/22   Doree Albee, PA-C  thiamine (VITAMIN B1) 100 MG tablet Take 1 tablet (100 mg total) by mouth daily. 07/23/22   Ghimire, Werner Lean, MD  Vitamin D, Ergocalciferol, (DRISDOL) 1.25 MG (50000 UNIT) CAPS capsule Take 1 capsule (50,000 Units total) by mouth every Wednesday. Patient taking differently: Take 50,000 Units by mouth once a week. 07/27/22   Ghimire, Werner Lean, MD  amitriptyline (ELAVIL) 25 MG tablet Take 1 tablet (25 mg total) by mouth at bedtime. Patient not taking: Reported on 08/08/2019 10/15/18 08/08/19  Rolly Salter, MD      Allergies    Robaxin [methocarbamol], Aspirin, Shellfish-derived products, Trazodone and nefazodone, Adhesive [tape], Latex, Toradol [ketorolac tromethamine], Contrast media [iodinated contrast media], Reglan [metoclopramide], and Rosanne Ashing oil]    Review of Systems   Review of Systems  All other systems reviewed and are negative.   Physical Exam Updated Vital Signs BP 124/89   Pulse (!) 54   Temp 98.6 F (37 C) (Oral)   Resp (!) 21   Ht 5\' 8"  (1.727 m)   Wt 55.8 kg   SpO2 (!) 81%   BMI 18.70 kg/m  Physical Exam Vitals and nursing note reviewed.  Constitutional:      Appearance: He is well-developed.  HENT:     Head: Atraumatic.  Cardiovascular:     Rate and Rhythm: Normal rate.     Pulses:          Radial pulses are 2+ on the right side and 2+ on the left side.     Heart sounds: Normal heart sounds.  Pulmonary:     Effort: Pulmonary effort is normal.     Breath sounds: Normal breath sounds.  Abdominal:     Palpations: Abdomen is soft.     Comments: Epigastric  tendnerness, no peritonitis  Musculoskeletal:     Cervical back: Neck supple.  Skin:    General: Skin is warm.  Neurological:     Mental Status: He is alert and oriented to person, place, and time.     ED Results / Procedures / Treatments   Labs (all labs ordered are listed, but only abnormal results are displayed) Labs Reviewed  BASIC METABOLIC PANEL - Abnormal; Notable for the following components:      Result Value   CO2 10 (*)    Creatinine, Ser 1.68 (*)    GFR, Estimated 48 (*)    Anion gap 18 (*)    All other components within normal limits  CBC - Abnormal; Notable for the following components:   WBC 13.7 (*)    Hemoglobin 11.8 (*)    HCT 38.3 (*)    RDW 17.9 (*)    All other components within normal limits  MAGNESIUM - Abnormal; Notable for the following components:   Magnesium 1.5 (*)    All other components within normal limits  LIPASE, BLOOD  TROPONIN I (HIGH SENSITIVITY)  TROPONIN I (HIGH SENSITIVITY)    EKG EKG Interpretation Date/Time:  Sunday January 01 2023 17:23:19 EDT Ventricular Rate:  91 PR Interval:  103 QRS Duration:  85 QT Interval:  347 QTC Calculation: 427 R Axis:   73  Text Interpretation: Sinus rhythm Short PR interval Biatrial enlargement No acute changes No significant change since last tracing Confirmed by Derwood Kaplan [phone]) on 01/01/2023 5:52:27 PM  Radiology DG Chest 2 View  Result Date: 01/01/2023 CLINICAL DATA:  Chest pain EXAM: CHEST - 2 VIEW COMPARISON:  None Available. FINDINGS: The heart size and mediastinal contours are within normal limits. No consolidation, pneumothorax or effusion. No edema. The visualized skeletal structures are unremarkable. Overlapping cardiac leads. Air-fluid level along the stomach beneath the left hemidiaphragm. IMPRESSION: No acute cardiopulmonary disease Electronically Signed   By: Karen Kays M.D.   On: 01/01/2023 18:32    Procedures .Critical Care  Performed by: Derwood Kaplan,  MD Authorized by: Derwood Kaplan, MD   Critical care provider statement:    Critical care time (minutes):  37   Critical care was necessary to treat or prevent imminent or life-threatening deterioration of the following conditions:  Metabolic crisis   Critical care was time spent personally by me on the following activities:  Development of treatment plan with patient or surrogate, discussions with consultants, evaluation of patient's response to treatment, examination of patient, ordering and review of laboratory studies, ordering and review of radiographic studies, ordering and performing treatments and interventions, pulse oximetry, re-evaluation of patient's condition, review of old charts and obtaining history from patient or surrogate     Medications Ordered in ED Medications  alum & mag hydroxide-simeth (MAALOX/MYLANTA) 200-200-20 MG/5ML suspension 30 mL (has no administration in time range)  ondansetron (ZOFRAN) injection 4 mg (has no administration in time range)  magnesium sulfate IVPB 2 g 50 mL (has no administration in time range)  lactated ringers bolus 1,000 mL (has no administration in time range)  HYDROmorphone (DILAUDID) injection 1 mg (has no administration in time range)  lactated ringers bolus 1,000 mL (1,000 mLs Intravenous New Bag/Given 01/01/23 1833)  morphine (PF) 4 MG/ML injection 8 mg (8 mg Intravenous Given 01/01/23 1833)  pantoprazole (PROTONIX) injection 40 mg (40 mg Intravenous Given 01/01/23 1836)    ED Course/ Medical Decision Making/ A&P                                 Medical Decision Making Amount and/or Complexity of Data Reviewed Labs: ordered. Radiology: ordered.  Risk OTC drugs. Prescription drug management. Decision regarding hospitalization.  This patient presents to the ED with chief complaint(s) of abdominal pain, nausea, vomiting, diarrhea with pertinent past medical history of alcoholic pancreatitis, gastritis, cocaine use disorder, last  use being about a month ago.The complaint involves an extensive differential diagnosis and also carries with it a high risk of complications and morbidity.    The differential diagnosis includes : Pancreatitis, Hepatobiliary pathology including cholelithiasis and cholecystitis, Gastritis/peptic ulcer disease, small bowel obstruction, Acute  coronary syndrome, Aortic Dissection   The initial plan is to get basic labs, given some IV fluid and reassess the patient.  I reviewed patient's previous labs and imaging.  Patient is not toxic appearing, doubt that this is necrotic pancreatitis or infected pancreatic pseudocyst.   Additional history obtained: Records reviewed previous admission documents.  I have also reviewed patient's previous upper endoscopy from 2022, he has gastritis.  He also had CT scan in May, June and July.  All from showing gastritis and pancreatitis like finding.  Patient does have a pseudocyst.   Independent labs interpretation:  The following labs were independently interpreted: Patient has hypomagnesemia, elevated creatinine and also significantly low bicarb with anion gap.  Bicarb is less than 10. Plan is to give him another liter of IV fluid and then we can repeat BMP.  Potation continues to complain of pain.  Additional pain medications ordered.  I do not think CT imaging is still indicated at this time.  Plan is to just resuscitate him better.  Lipase is reassuring.  CBC shows mild elevation of white count at 13.7.  If patient's symptoms continue to persist, then CT scan can be ordered by admitting team.    Final Clinical Impression(s) / ED Diagnoses Final diagnoses:  Alcohol-induced chronic pancreatitis (HCC)  Acute alcoholic gastritis without hemorrhage  Severe dehydration  AKI (acute kidney injury) [facility])    Rx / DC Orders ED Discharge Orders     None         Derwood Kaplan, MD 01/01/23 2114

## 2023-01-02 ENCOUNTER — Other Ambulatory Visit: Payer: Self-pay

## 2023-01-02 ENCOUNTER — Encounter (HOSPITAL_COMMUNITY): Payer: Self-pay | Admitting: Family Medicine

## 2023-01-02 DIAGNOSIS — I480 Paroxysmal atrial fibrillation: Secondary | ICD-10-CM | POA: Diagnosis present

## 2023-01-02 DIAGNOSIS — F1410 Cocaine abuse, uncomplicated: Secondary | ICD-10-CM | POA: Diagnosis present

## 2023-01-02 DIAGNOSIS — N179 Acute kidney failure, unspecified: Secondary | ICD-10-CM | POA: Diagnosis present

## 2023-01-02 DIAGNOSIS — Z91041 Radiographic dye allergy status: Secondary | ICD-10-CM | POA: Diagnosis not present

## 2023-01-02 DIAGNOSIS — E872 Acidosis, unspecified: Secondary | ICD-10-CM | POA: Diagnosis present

## 2023-01-02 DIAGNOSIS — Z91040 Latex allergy status: Secondary | ICD-10-CM | POA: Diagnosis not present

## 2023-01-02 DIAGNOSIS — K860 Alcohol-induced chronic pancreatitis: Secondary | ICD-10-CM | POA: Diagnosis present

## 2023-01-02 DIAGNOSIS — D573 Sickle-cell trait: Secondary | ICD-10-CM | POA: Diagnosis present

## 2023-01-02 DIAGNOSIS — E7800 Pure hypercholesterolemia, unspecified: Secondary | ICD-10-CM | POA: Diagnosis present

## 2023-01-02 DIAGNOSIS — F17210 Nicotine dependence, cigarettes, uncomplicated: Secondary | ICD-10-CM | POA: Diagnosis present

## 2023-01-02 DIAGNOSIS — F1210 Cannabis abuse, uncomplicated: Secondary | ICD-10-CM | POA: Diagnosis present

## 2023-01-02 DIAGNOSIS — E860 Dehydration: Secondary | ICD-10-CM

## 2023-01-02 DIAGNOSIS — K219 Gastro-esophageal reflux disease without esophagitis: Secondary | ICD-10-CM | POA: Diagnosis present

## 2023-01-02 DIAGNOSIS — F1010 Alcohol abuse, uncomplicated: Secondary | ICD-10-CM | POA: Diagnosis present

## 2023-01-02 DIAGNOSIS — D6959 Other secondary thrombocytopenia: Secondary | ICD-10-CM | POA: Diagnosis present

## 2023-01-02 DIAGNOSIS — F319 Bipolar disorder, unspecified: Secondary | ICD-10-CM | POA: Diagnosis present

## 2023-01-02 DIAGNOSIS — K859 Acute pancreatitis without necrosis or infection, unspecified: Secondary | ICD-10-CM | POA: Diagnosis not present

## 2023-01-02 DIAGNOSIS — K2920 Alcoholic gastritis without bleeding: Secondary | ICD-10-CM | POA: Diagnosis not present

## 2023-01-02 DIAGNOSIS — D649 Anemia, unspecified: Secondary | ICD-10-CM | POA: Diagnosis present

## 2023-01-02 DIAGNOSIS — Z888 Allergy status to other drugs, medicaments and biological substances status: Secondary | ICD-10-CM | POA: Diagnosis not present

## 2023-01-02 DIAGNOSIS — I10 Essential (primary) hypertension: Secondary | ICD-10-CM | POA: Diagnosis present

## 2023-01-02 DIAGNOSIS — Z885 Allergy status to narcotic agent status: Secondary | ICD-10-CM | POA: Diagnosis not present

## 2023-01-02 DIAGNOSIS — K863 Pseudocyst of pancreas: Secondary | ICD-10-CM | POA: Diagnosis present

## 2023-01-02 DIAGNOSIS — K861 Other chronic pancreatitis: Secondary | ICD-10-CM | POA: Diagnosis not present

## 2023-01-02 DIAGNOSIS — Z79899 Other long term (current) drug therapy: Secondary | ICD-10-CM | POA: Diagnosis not present

## 2023-01-02 DIAGNOSIS — K852 Alcohol induced acute pancreatitis without necrosis or infection: Secondary | ICD-10-CM | POA: Diagnosis present

## 2023-01-02 DIAGNOSIS — K862 Cyst of pancreas: Secondary | ICD-10-CM

## 2023-01-02 LAB — CBC
HCT: 28.8 % — ABNORMAL LOW (ref 39.0–52.0)
Hemoglobin: 9 g/dL — ABNORMAL LOW (ref 13.0–17.0)
MCH: 28.7 pg (ref 26.0–34.0)
MCHC: 31.3 g/dL (ref 30.0–36.0)
MCV: 91.7 fL (ref 80.0–100.0)
Platelets: 147 10*3/uL — ABNORMAL LOW (ref 150–400)
RBC: 3.14 MIL/uL — ABNORMAL LOW (ref 4.22–5.81)
RDW: 17.1 % — ABNORMAL HIGH (ref 11.5–15.5)
WBC: 11.1 10*3/uL — ABNORMAL HIGH (ref 4.0–10.5)
nRBC: 0 % (ref 0.0–0.2)

## 2023-01-02 LAB — BASIC METABOLIC PANEL
Anion gap: 10 (ref 5–15)
BUN: 9 mg/dL (ref 6–20)
CO2: 16 mmol/L — ABNORMAL LOW (ref 22–32)
Calcium: 7.9 mg/dL — ABNORMAL LOW (ref 8.9–10.3)
Chloride: 109 mmol/L (ref 98–111)
Creatinine, Ser: 0.98 mg/dL (ref 0.61–1.24)
GFR, Estimated: >60 mL/min (ref >60)
Glucose, Bld: 78 mg/dL (ref 70–99)
Potassium: 3.6 mmol/L (ref 3.5–5.1)
Sodium: 135 mmol/L (ref 135–145)

## 2023-01-02 LAB — HIV ANTIBODY (ROUTINE TESTING W REFLEX): HIV Screen 4th Generation wRfx: NONREACTIVE

## 2023-01-02 LAB — MAGNESIUM: Magnesium: 1.8 mg/dL (ref 1.7–2.4)

## 2023-01-02 LAB — TROPONIN I (HIGH SENSITIVITY): Troponin I (High Sensitivity): 4 ng/L (ref <18)

## 2023-01-02 MED ORDER — ONDANSETRON HCL 4 MG/2ML IJ SOLN
4.0000 mg | Freq: Four times a day (QID) | INTRAMUSCULAR | Status: DC | PRN
  Administered 2023-01-03: 4 mg via INTRAVENOUS
  Filled 2023-01-02: qty 2

## 2023-01-02 MED ORDER — LACTATED RINGERS IV SOLN: INTRAVENOUS | Status: DC

## 2023-01-02 MED ORDER — NICOTINE 21 MG/24HR TD PT24
21.0000 mg | MEDICATED_PATCH | Freq: Every day | TRANSDERMAL | Status: DC
  Administered 2023-01-02 – 2023-01-05 (×4): 21 mg via TRANSDERMAL
  Filled 2023-01-02 (×4): qty 1

## 2023-01-02 MED ORDER — OXYCODONE HCL 5 MG PO TABS
5.0000 mg | ORAL_TABLET | ORAL | Status: DC | PRN
  Administered 2023-01-02 – 2023-01-04 (×8): 5 mg via ORAL
  Filled 2023-01-02 (×10): qty 1

## 2023-01-02 MED ORDER — HYDROMORPHONE HCL 1 MG/ML IJ SOLN
1.0000 mg | Freq: Once | INTRAMUSCULAR | Status: AC
  Administered 2023-01-02: 1 mg via INTRAVENOUS
  Filled 2023-01-02: qty 1

## 2023-01-02 MED ORDER — THIAMINE MONONITRATE 100 MG PO TABS
100.0000 mg | ORAL_TABLET | Freq: Every day | ORAL | Status: DC
  Administered 2023-01-02 – 2023-01-05 (×4): 100 mg via ORAL
  Filled 2023-01-02 (×4): qty 1

## 2023-01-02 MED ORDER — HYDROMORPHONE HCL 1 MG/ML IJ SOLN
0.5000 mg | INTRAMUSCULAR | Status: DC | PRN
  Administered 2023-01-02 – 2023-01-05 (×20): 1 mg via INTRAVENOUS
  Filled 2023-01-02 (×22): qty 1

## 2023-01-02 MED ORDER — ACETAMINOPHEN 325 MG PO TABS
650.0000 mg | ORAL_TABLET | Freq: Four times a day (QID) | ORAL | Status: DC | PRN
  Administered 2023-01-02 – 2023-01-05 (×5): 650 mg via ORAL
  Filled 2023-01-02 (×5): qty 2

## 2023-01-02 NOTE — Progress Notes (Addendum)
PROGRESS NOTE   [name]  VOZ:[id number]    DOB: 14-Jul-1970    DOA: 01/01/2023  PCP: Pcp, No   I have briefly reviewed patients previous medical records in [facility].  Chief Complaint  Patient presents with   Chest Pain    Brief Narrative:  52 year old male with PMH of polysubstance use (alcohol use disorder, cocaine, THC), PAF not on anticoagulation due to substance abuse and nonadherence, WPW s/p ablation, alcohol-related chronic pancreatitis, pancreatic pseudocyst, HTN, GERD, HLD, bipolar disorder, depression and anxiety, history of seizures (related to electrolyte abnormalities and alcohol/cocaine use), presented to the ED on 01/01/2023 with complaints of abdominal pain, persistent diarrhea and some vomiting since night prior consistent with prior episodes of acute on chronic pancreatitis.  Volunteers to drinking two 24 ounce beers when consuming fatty food prior to symptoms.  Admitted for suspected acute on chronic pancreatitis, dehydration due to GI losses, AKI, NAGMA and electrolyte abnormalities.    Assessment & Plan:  Active Problems:   Acute on chronic pancreatitis (HCC)   AKI (acute kidney injury) (HCC)   Paroxysmal atrial fibrillation (HCC)   Pancreatic pseudocyst/cyst   Essential hypertension   Polysubstance abuse (tobacco, cocaine, THC, and ETOH)   Gastroesophageal reflux disease   Hypomagnesemia   Acute on chronic alcoholic pancreatitis, history of pancreatic pseudocyst: Ongoing alcohol use Frequent admissions for same. Has had several CT abdomen and pelvis almost on a monthly basis this year and on admission repeat CT was not warranted.  Lipase normal at 20 Treat supportively with bowel rest (full liquids and advance as tolerated), IV fluid hydration, multimodality pain control (minimize opioids as much as possible), p.o. Pepcid/Protonix/sucralfate (can change PPI to IV if ongoing emesis) and as needed antiemetics. Continue Creon  supplements Alcohol moderation and eventual abstinence counseled.  Acute kidney injury/NAGMA  due to dehydration from GI losses Presented with creatinine of 1.68.  Resolved after IV fluids. Continue IV fluids until able to consistently tolerate p.o. Presented with bicarb of 10, anion gap of 18 which has improved to 16.  Lactated Ringer's.  Follow BMP in a.m.  Hypomagnesemia Magnesium 1.5 on admission.  IV replaced. Follow-up magnesium and replace as needed.  Normocytic anemia: Hemoglobin is dropped from 11.8-9, likely hemodilution Follow CBCs daily No overt bleeding  Mild thrombocytopenia Secondary to alcohol use Follow CBC daily  Polysubstance abuse (alcohol use disorder, cocaine, tobacco and THC) Ongoing current use including prior to admission Cessation counseled CIWA protocol.  Nicotine patch. Patient states that he would like to go to [facility] substance abuse rehab-TOC has been consulted.  Paroxysmal A-fib Continue Toprol-XL 200 Mg daily. Sinus rhythm on telemetry Not a candidate for long-term anticoagulation due to substance abuse and nonadherence  HTN Controlled on metoprolol, continue  GERD: Pepcid/PPI/sucralfate   Body mass index is 18.7 kg/m.  ACP Documents: None present DVT prophylaxis: enoxaparin (LOVENOX) injection 40 mg Start: 01/01/23 2200     Code Status: Full Code:  Family Communication: None at bedside Disposition:  Status is: Observation The patient will require care spanning > 2 midnights and should be moved to inpatient because: IV fluids and meds, DC pending clinical improvement which will probably be 2 to 3 days.     Consultants:     Procedures:     Antimicrobials:      Subjective:  Patient reports that he has 7/10 epigastric abdominal pain radiating to back.  No vomiting since admission.  States that he had about 20 clear/loose stools in the last  24 hours and 2 episodes this morning.  No blood in stools, black stools, vomiting  blood or coffee-ground material.  Volunteers to drinking 240 ounce beers daily up to 6 days a week, doing cocaine and THC prior to admission.  Asking to go to [facility] postdischarge.  Objective:   Vitals:   01/01/23 2259 01/02/23 0302 01/02/23 0439 01/02/23 0814  BP:  126/88 (!) 133/97 (!) 124/91  Pulse:  76 71 61  Resp: 18 16 18 18   Temp:  98 F (36.7 C) (!) 97.5 F (36.4 C) (!) 97.4 F (36.3 C)  TempSrc:  Oral Oral   SpO2:  98% 100% (!) 66%  Weight:      Height:        General exam: Young male, moderately built, frail, chronically ill looking lying comfortably propped up in bed without distress.  Oral mucosa moist. Respiratory system: Clear to auscultation. Respiratory effort normal. Cardiovascular system: S1 & S2 heard, RRR. No JVD, murmurs, rubs, gallops or clicks. No pedal edema. Gastrointestinal system: Abdomen is nondistended, soft.  Mild epigastric tenderness without peritoneal signs. No organomegaly or masses felt. Normal bowel sounds heard. Central nervous system: Alert and oriented. No focal neurological deficits. Extremities: Symmetric 5 x 5 power. Skin: No rashes, lesions or ulcers Psychiatry: Judgement and insight appear normal. Mood & affect appropriate.     Data Reviewed:   I have personally reviewed following labs and imaging studies   CBC: Recent Labs  Lab 01/01/23 1841 01/02/23 0556  WBC 13.7* 11.1*  HGB 11.8* 9.0*  HCT 38.3* 28.8*  MCV 90.1 91.7  PLT 213 147*    Basic Metabolic Panel: Recent Labs  Lab 01/01/23 1841 01/02/23 0556  NA 138 135  K 4.7 3.6  CL 110 109  CO2 10* 16*  GLUCOSE 83 78  BUN 14 9  CREATININE 1.68* 0.98  CALCIUM 9.0 7.9*  MG 1.5*  --     Liver Function Tests: No results for input(s): "AST", "ALT", "ALKPHOS", "BILITOT", "PROT", "ALBUMIN" in the last 168 hours.  CBG: No results for input(s): "GLUCAP" in the last 168 hours.  Microbiology Studies:  No results found for this or any previous visit (from the past 240  hour(s)).  Radiology Studies:  DG Chest 2 View  Result Date: 01/01/2023 CLINICAL DATA:  Chest pain EXAM: CHEST - 2 VIEW COMPARISON:  None Available. FINDINGS: The heart size and mediastinal contours are within normal limits. No consolidation, pneumothorax or effusion. No edema. The visualized skeletal structures are unremarkable. Overlapping cardiac leads. Air-fluid level along the stomach beneath the left hemidiaphragm. IMPRESSION: No acute cardiopulmonary disease Electronically Signed   By: Karen Kays M.D.   On: 01/01/2023 18:32    Scheduled Meds:    enoxaparin (LOVENOX) injection  40 mg Subcutaneous Q24H   famotidine  20 mg Oral BID   ferrous sulfate  325 mg Oral Daily   folic acid  1 mg Oral Daily   lipase/protease/amylase  72,000 Units Oral TID WC   metoprolol  200 mg Oral Daily   nicotine  21 mg Transdermal Daily   pantoprazole  40 mg Oral Daily   pregabalin  100 mg Oral BID   sucralfate  1 g Oral TID AC & HS    Continuous Infusions:     LOS: 0 days     Marcellus Scott, MD,  FACP, FHM, [facility], [facility], [facility]   [facility] [place]     To contact the attending provider between  7A-7P or the covering provider during after hours 7P-7A, please log into the web site [email or web address] and access using universal [place] password for that web site. If you do not have the password, please call the hospital operator.  01/02/2023, 10:14 AM

## 2023-01-02 NOTE — Progress Notes (Signed)
Education materials provided at the bedside.  Patient plans to call [facility] and follow up regarding OP counseling services.

## 2023-01-02 NOTE — Progress Notes (Signed)
Pt complaining of upper left quad ABD pain. MD notified via epic messenger,

## 2023-01-02 NOTE — Plan of Care (Signed)

## 2023-01-02 NOTE — Telephone Encounter (Signed)
EUS Celiac block FNA has been set up for 03/02/23 at 1030 am at [facility] with GM   Left message on machine to call back

## 2023-01-02 NOTE — Progress Notes (Signed)
Transition of Care [facility]) - Inpatient Brief Assessment   Patient Details  Name: [name] MRN: [id number] Date of Birth: 26-Jun-1970  Transition [facility]) CM/SW Contact:    Janae Bridgeman, RN Phone Number: 01/02/2023, 2:45 PM   Clinical Narrative: CM spoke with the patient at the bedside to discuss TOC needs.  The patient states that he lives with his mother at the home and drinks 2-3 40 ounce beers day, snorts cocaine - last use was last Friday and uses marijuana as well.  Resources provided to the patient at the bedside and the patient plans to call [facility] for follow up for Substance abuse counseling.  The patient states that he past history of "being clean for 7 years after admission to an inpatient substance facility in [facility] facility years ago".  Patient states that he has disability and does not work.  Patient was agreeable to be set up for hospital follow up with [facility] clinic - patient scheduled and placed in AVS.  Patient plans to take Medicaid transportation.  No other TOC needs at this time.   Transition of Care Asessment: Insurance and Status: (P) Insurance coverage has been reviewed Patient has primary care physician: (P) No (Patient scheduled for hospital follow up with [facility] - see AVS) Home environment has been reviewed: (P) Patient lives at home with his mother Prior level of function:: (P) Independent Prior/Current Home Services: (P) No current home services Social Determinants of Health Reivew: (P) SDOH reviewed interventions complete Readmission risk has been reviewed: (P) Yes Transition of care needs: (P) transition of care needs identified, TOC will continue to follow

## 2023-01-03 DIAGNOSIS — K860 Alcohol-induced chronic pancreatitis: Secondary | ICD-10-CM | POA: Diagnosis not present

## 2023-01-03 DIAGNOSIS — N179 Acute kidney failure, unspecified: Secondary | ICD-10-CM | POA: Diagnosis not present

## 2023-01-03 LAB — COMPREHENSIVE METABOLIC PANEL
ALT: 32 U/L (ref 0–44)
AST: 33 U/L (ref 15–41)
Albumin: 3.2 g/dL — ABNORMAL LOW (ref 3.5–5.0)
Alkaline Phosphatase: 64 U/L (ref 38–126)
Anion gap: 8 (ref 5–15)
BUN: <5 mg/dL — ABNORMAL LOW (ref 6–20)
CO2: 19 mmol/L — ABNORMAL LOW (ref 22–32)
Calcium: 8.1 mg/dL — ABNORMAL LOW (ref 8.9–10.3)
Chloride: 112 mmol/L — ABNORMAL HIGH (ref 98–111)
Creatinine, Ser: 0.66 mg/dL (ref 0.61–1.24)
GFR, Estimated: >60 mL/min (ref >60)
Glucose, Bld: 65 mg/dL — ABNORMAL LOW (ref 70–99)
Potassium: 4.3 mmol/L (ref 3.5–5.1)
Sodium: 139 mmol/L (ref 135–145)
Total Bilirubin: 0.6 mg/dL (ref 0.3–1.2)
Total Protein: 6.2 g/dL — ABNORMAL LOW (ref 6.5–8.1)

## 2023-01-03 LAB — CBC
HCT: 29.9 % — ABNORMAL LOW (ref 39.0–52.0)
Hemoglobin: 8.9 g/dL — ABNORMAL LOW (ref 13.0–17.0)
MCH: 28.1 pg (ref 26.0–34.0)
MCHC: 29.8 g/dL — ABNORMAL LOW (ref 30.0–36.0)
MCV: 94.3 fL (ref 80.0–100.0)
Platelets: 148 10*3/uL — ABNORMAL LOW (ref 150–400)
RBC: 3.17 MIL/uL — ABNORMAL LOW (ref 4.22–5.81)
RDW: 17.5 % — ABNORMAL HIGH (ref 11.5–15.5)
WBC: 7.6 10*3/uL (ref 4.0–10.5)
nRBC: 0 % (ref 0.0–0.2)

## 2023-01-03 LAB — GLUCOSE, CAPILLARY
Glucose-Capillary: 53 mg/dL — ABNORMAL LOW (ref 70–99)
Glucose-Capillary: 72 mg/dL (ref 70–99)
Glucose-Capillary: 76 mg/dL (ref 70–99)

## 2023-01-03 LAB — MAGNESIUM: Magnesium: 1.2 mg/dL — ABNORMAL LOW (ref 1.7–2.4)

## 2023-01-03 LAB — PHOSPHORUS: Phosphorus: 2.6 mg/dL (ref 2.5–4.6)

## 2023-01-03 MED ORDER — LACTATED RINGERS IV SOLN: INTRAVENOUS | Status: AC

## 2023-01-03 MED ORDER — MAGNESIUM SULFATE 4 GM/100ML IV SOLN
4.0000 g | Freq: Once | INTRAVENOUS | Status: AC
  Administered 2023-01-03: 4 g via INTRAVENOUS
  Filled 2023-01-03: qty 100

## 2023-01-03 NOTE — Progress Notes (Signed)
PROGRESS NOTE   [name]  VWU:[id number]    DOB: 25-Feb-1971    DOA: 01/01/2023  PCP: Pcp, No   I have briefly reviewed patients previous medical records in [facility].  Chief Complaint  Patient presents with   Chest Pain    Brief Narrative:  52 year old male with PMH of polysubstance use (alcohol use disorder, cocaine, THC), PAF not on anticoagulation due to substance abuse and nonadherence, WPW s/p ablation, alcohol-related chronic pancreatitis, pancreatic pseudocyst, HTN, GERD, HLD, bipolar disorder, depression and anxiety, history of seizures (related to electrolyte abnormalities and alcohol/cocaine use), presented to the ED on 01/01/2023 with complaints of abdominal pain, persistent diarrhea and some vomiting since night prior consistent with prior episodes of acute on chronic pancreatitis.  Volunteers to drinking two 24 ounce beers when consuming fatty food prior to symptoms.  Admitted for suspected acute on chronic pancreatitis, dehydration due to GI losses, AKI, NAGMA and electrolyte abnormalities.    Assessment & Plan:  Active Problems:   Acute on chronic pancreatitis (HCC)   AKI (acute kidney injury) (HCC)   Paroxysmal atrial fibrillation (HCC)   Pancreatic pseudocyst/cyst   Essential hypertension   Polysubstance abuse (tobacco, cocaine, THC, and ETOH)   Gastroesophageal reflux disease   Hypomagnesemia   Acute on chronic alcoholic pancreatitis, history of pancreatic pseudocyst: Ongoing alcohol use Frequent admissions for same. Has had several CT abdomen and pelvis almost on a monthly basis this year and on admission repeat CT was not warranted.  Lipase normal at 20 Treat supportively with bowel rest (full liquids and advance as tolerated), IV fluid hydration, multimodality pain control (minimize opioids as much as possible), p.o. Pepcid/Protonix/sucralfate (can change PPI to IV if ongoing emesis) and as needed antiemetics. Continue Creon  supplements Alcohol moderation and eventual abstinence counseled. Some abdominal pain, worsened after he tried grits last night and hence remained on liquids, willing to try soft diet today.  Acute kidney injury/NAGMA  due to dehydration from GI losses Presented with creatinine of 1.68.  Resolved after IV fluids. Continue IV fluids until able to consistently tolerate p.o. Presented with bicarb of 10, anion gap of 18 which has improved to 16.  Lactated Ringer's.   Despite labs being ordered, not drawn even up to 11 AM, reordered stat labs.  Hypomagnesemia Magnesium 1.5 on admission.  IV replaced. Follow-up magnesium and replace as needed.  Normocytic anemia: Hemoglobin is dropped from 11.8-9, likely hemodilution Follow CBCs daily-reordered No overt bleeding  Mild thrombocytopenia Secondary to alcohol use Follow CBC daily-reordered  Polysubstance abuse (alcohol use disorder, cocaine, tobacco and THC) Ongoing current use including prior to admission Cessation counseled CIWA protocol.  Nicotine patch.  CIWA scores 0. Patient states that he would like to go to [facility] substance abuse rehab-TOC has seen and input appreciated  Paroxysmal A-fib Continue Toprol-XL 200 Mg daily. Sinus rhythm on telemetry, telemetry discontinued  Not a candidate for long-term anticoagulation due to substance abuse and nonadherence  HTN Controlled on metoprolol, continue  GERD: Pepcid/PPI/sucralfate   Body mass index is 18.7 kg/m.  ACP Documents: None present DVT prophylaxis: enoxaparin (LOVENOX) injection 40 mg Start: 01/01/23 2200     Code Status: Full Code:  Family Communication: None at bedside Disposition:  Inpatient appropriate.  Yet to advance diet due to pain.  Remains on IVF.     Consultants:     Procedures:     Antimicrobials:      Subjective:  No BM in the last 24 hours.  No  vomiting.  Last night tried to eat some grits but developed worsening pain and back on fluids.   Ongoing abdominal pain, mild to moderate  Objective:   Vitals:   01/02/23 1946 01/03/23 0002 01/03/23 0521 01/03/23 0805  BP: (!) 158/94 (!) 150/95 (!) 154/102 (!) 134/92  Pulse: 85 81 70 85  Resp: 16 17 16 18   Temp: 98.4 F (36.9 C) 97.9 F (36.6 C) 97.7 F (36.5 C) (!) 97.5 F (36.4 C)  TempSrc: Oral Oral  Oral  SpO2: 100% 100% (!) 89% 100%  Weight:      Height:        General exam: Young male, moderately built, frail, chronically ill looking seen ambulating steadily to the nurses station and back.  Examined in his bed.  Oral mucosa moist. Respiratory system: Clear to auscultation. Respiratory effort normal. Cardiovascular system: S1 & S2 heard, RRR. No JVD, murmurs, rubs, gallops or clicks. No pedal edema. Gastrointestinal system: Abdomen is nondistended, soft.  Mild epigastric tenderness without peritoneal signs, unchanged compared to yesterday. No organomegaly or masses felt. Normal bowel sounds heard. Central nervous system: Alert and oriented. No focal neurological deficits. Extremities: Symmetric 5 x 5 power. Skin: No rashes, lesions or ulcers Psychiatry: Judgement and insight appear normal. Mood & affect appropriate.     Data Reviewed:   I have personally reviewed following labs and imaging studies   CBC: Recent Labs  Lab 01/01/23 1841 01/02/23 0556  WBC 13.7* 11.1*  HGB 11.8* 9.0*  HCT 38.3* 28.8*  MCV 90.1 91.7  PLT 213 147*    Basic Metabolic Panel: Recent Labs  Lab 01/01/23 1841 01/02/23 0556  NA 138 135  K 4.7 3.6  CL 110 109  CO2 10* 16*  GLUCOSE 83 78  BUN 14 9  CREATININE 1.68* 0.98  CALCIUM 9.0 7.9*  MG 1.5* 1.8    Liver Function Tests: No results for input(s): "AST", "ALT", "ALKPHOS", "BILITOT", "PROT", "ALBUMIN" in the last 168 hours.  CBG: No results for input(s): "GLUCAP" in the last 168 hours.  Microbiology Studies:  No results found for this or any previous visit (from the past 240 hour(s)).  Radiology Studies:  DG Chest  2 View  Result Date: 01/01/2023 CLINICAL DATA:  Chest pain EXAM: CHEST - 2 VIEW COMPARISON:  None Available. FINDINGS: The heart size and mediastinal contours are within normal limits. No consolidation, pneumothorax or effusion. No edema. The visualized skeletal structures are unremarkable. Overlapping cardiac leads. Air-fluid level along the stomach beneath the left hemidiaphragm. IMPRESSION: No acute cardiopulmonary disease Electronically Signed   By: Karen Kays M.D.   On: 01/01/2023 18:32    Scheduled Meds:    enoxaparin (LOVENOX) injection  40 mg Subcutaneous Q24H   famotidine  20 mg Oral BID   ferrous sulfate  325 mg Oral Daily   folic acid  1 mg Oral Daily   lipase/protease/amylase  72,000 Units Oral TID WC   metoprolol  200 mg Oral Daily   nicotine  21 mg Transdermal Daily   pantoprazole  40 mg Oral Daily   pregabalin  100 mg Oral BID   sucralfate  1 g Oral TID AC & HS   thiamine  100 mg Oral Daily    Continuous Infusions:    lactated ringers 125 mL/hr at 01/03/23 0433     LOS: 1 day     Marcellus Scott, MD,  FACP, [facility], [facility], [facility], [facility]   [facility]  To contact the attending provider between 7A-7P or the covering provider during after hours 7P-7A, please log into the web site [email or web address] and access using universal [place] password for that web site. If you do not have the password, please call the hospital operator.  01/03/2023, 11:03 AM

## 2023-01-03 NOTE — Progress Notes (Addendum)
Patient was upgraded to a regular diet. He had baked chicken, mashed potatoes with gravy and macaroni and cheese. He ate about 50% of the tray. He complained of really bad abdomen pain. PRNs given per MAR. Will degrade him to liquid diet.

## 2023-01-03 NOTE — Plan of Care (Signed)

## 2023-01-03 NOTE — Telephone Encounter (Signed)
Line rings then goes busy 

## 2023-01-03 NOTE — Progress Notes (Signed)
Pt resting quietly with eyes closed.  Resp even and unlabored.  Pt appears to be sleeping comfortably.

## 2023-01-04 DIAGNOSIS — N179 Acute kidney failure, unspecified: Secondary | ICD-10-CM | POA: Diagnosis not present

## 2023-01-04 DIAGNOSIS — E860 Dehydration: Secondary | ICD-10-CM | POA: Diagnosis not present

## 2023-01-04 DIAGNOSIS — K860 Alcohol-induced chronic pancreatitis: Secondary | ICD-10-CM | POA: Diagnosis not present

## 2023-01-04 DIAGNOSIS — K2920 Alcoholic gastritis without bleeding: Secondary | ICD-10-CM

## 2023-01-04 LAB — CBC
HCT: 29.8 % — ABNORMAL LOW (ref 39.0–52.0)
Hemoglobin: 9.2 g/dL — ABNORMAL LOW (ref 13.0–17.0)
MCH: 28.8 pg (ref 26.0–34.0)
MCHC: 30.9 g/dL (ref 30.0–36.0)
MCV: 93.1 fL (ref 80.0–100.0)
Platelets: 137 10*3/uL — ABNORMAL LOW (ref 150–400)
RBC: 3.2 MIL/uL — ABNORMAL LOW (ref 4.22–5.81)
RDW: 17.9 % — ABNORMAL HIGH (ref 11.5–15.5)
WBC: 9.9 10*3/uL (ref 4.0–10.5)
nRBC: 0 % (ref 0.0–0.2)

## 2023-01-04 LAB — COMPREHENSIVE METABOLIC PANEL
ALT: 64 U/L — ABNORMAL HIGH (ref 0–44)
AST: 143 U/L — ABNORMAL HIGH (ref 15–41)
Albumin: 3.3 g/dL — ABNORMAL LOW (ref 3.5–5.0)
Alkaline Phosphatase: 104 U/L (ref 38–126)
Anion gap: 6 (ref 5–15)
BUN: <5 mg/dL — ABNORMAL LOW (ref 6–20)
CO2: 23 mmol/L (ref 22–32)
Calcium: 8.7 mg/dL — ABNORMAL LOW (ref 8.9–10.3)
Chloride: 109 mmol/L (ref 98–111)
Creatinine, Ser: 1.09 mg/dL (ref 0.61–1.24)
GFR, Estimated: >60 mL/min (ref >60)
Glucose, Bld: 86 mg/dL (ref 70–99)
Potassium: 5.1 mmol/L (ref 3.5–5.1)
Sodium: 138 mmol/L (ref 135–145)
Total Bilirubin: 0.9 mg/dL (ref 0.3–1.2)
Total Protein: 6.4 g/dL — ABNORMAL LOW (ref 6.5–8.1)

## 2023-01-04 LAB — OCCULT BLOOD X 1 CARD TO LAB, STOOL: Fecal Occult Bld: NEGATIVE

## 2023-01-04 LAB — GLUCOSE, CAPILLARY
Glucose-Capillary: 122 mg/dL — ABNORMAL HIGH (ref 70–99)
Glucose-Capillary: 100 mg/dL — ABNORMAL HIGH (ref 70–99)
Glucose-Capillary: 83 mg/dL (ref 70–99)

## 2023-01-04 LAB — MAGNESIUM: Magnesium: 1.8 mg/dL (ref 1.7–2.4)

## 2023-01-04 MED ORDER — GUAIFENESIN 100 MG/5ML PO LIQD: 5.0000 mL | ORAL | Status: DC | PRN

## 2023-01-04 MED ORDER — ENSURE ENLIVE PO LIQD
237.0000 mL | Freq: Two times a day (BID) | ORAL | Status: DC
  Administered 2023-01-04: 237 mL via ORAL

## 2023-01-04 MED ORDER — ALPRAZOLAM 0.5 MG PO TABS
0.5000 mg | ORAL_TABLET | Freq: Once | ORAL | Status: AC
  Administered 2023-01-04: 0.5 mg via ORAL
  Filled 2023-01-04: qty 1

## 2023-01-04 NOTE — Telephone Encounter (Signed)
I have attempted to reach the pt on multiple occasion by phone with no response. The line rings then goes busy.  I have mailed all the information to the pt home address.

## 2023-01-04 NOTE — Progress Notes (Addendum)
[facility]  PROGRESS NOTE  [name] ZOX:[id number] DOB: 05-27-1970 DOA: 01/01/2023 PCP: Pcp, No   Brief HPI:   52-year-old male with PMH of polysubstance use (alcohol use disorder, cocaine, THC), PAF not on anticoagulation due to substance abuse and nonadherence, WPW s/p ablation, alcohol-related chronic pancreatitis, pancreatic pseudocyst, HTN, GERD, HLD, bipolar disorder, depression and anxiety, history of seizures (related to electrolyte abnormalities and alcohol/cocaine use), presented to the ED on 01/01/2023 with complaints of abdominal pain, persistent diarrhea and some vomiting since night prior consistent with prior episodes of acute on chronic pancreatitis. Volunteers to drinking two 24 ounce beers when consuming fatty food prior to symptoms. Admitted for suspected acute on chronic pancreatitis, dehydration due to GI losses, AKI, NAGMA and electrolyte abnormalities.     Assessment/Plan:   Acute on chronic alcoholic pancreatitis, history of pancreatic pseudocyst: Ongoing alcohol use Frequent admissions for same. Has had several CT abdomen and pelvis almost on a monthly basis this year and on admission repeat CT was not warranted.  Lipase normal at 20 Treat supportively with bowel rest (full liquids and advance as tolerated), IV fluid hydration, multimodality pain control (minimize opioids as much as possible), p.o. Pepcid/Protonix/sucralfate (can change PPI to IV if ongoing emesis) and as needed antiemetics. Continue Creon supplements Alcohol moderation and eventual abstinence counseled. Continue Dilaudid as needed for abdominal pain  Transaminitis -?  Alcoholic hepatitis -Follow LFTs in a.m.   Acute kidney injury due to dehydration from GI losses Presented with creatinine of 1.68.  Resolved after IV fluids. Continue IV fluids until able to consistently tolerate p.o. Presented with bicarb of 10, anion gap of 18 which has improved to 16.  Lactated Ringer's.    -Creatinine 1.09   Hypomagnesemia -Replete   Normocytic anemia: Hemoglobin is dropped from 11.8-9, likely hemodilution Stool for FOBT checked this morning was negative  Mild thrombocytopenia Secondary to alcohol use Follow CBC daily-reordered   Polysubstance abuse (alcohol use disorder, cocaine, tobacco and THC) Ongoing current use including prior to admission Cessation counseled CIWA protocol.  Nicotine patch.  CIWA scores 0. Patient states that he would like to go to [facility] substance abuse rehab-TOC has seen and input appreciated   Paroxysmal A-fib Continue Toprol-XL 200 Mg daily. Sinus rhythm on telemetry, telemetry discontinued  Not a candidate for long-term anticoagulation due to substance abuse and nonadherence   HTN Controlled on metoprolol, continue   GERD: Pepcid/PPI/sucralfate      Medications     enoxaparin (LOVENOX) injection  40 mg Subcutaneous Q24H   famotidine  20 mg Oral BID   ferrous sulfate  325 mg Oral Daily   folic acid  1 mg Oral Daily   lipase/protease/amylase  72,000 Units Oral TID WC   metoprolol  200 mg Oral Daily   nicotine  21 mg Transdermal Daily   pantoprazole  40 mg Oral Daily   pregabalin  100 mg Oral BID   sucralfate  1 g Oral TID AC & HS   thiamine  100 mg Oral Daily     Data Reviewed:   CBG:  Recent Labs  Lab 01/03/23 1712 01/03/23 1819 01/03/23 2049 01/04/23 0759  GLUCAP 53* 72 76 122*    SpO2: 100 %    Vitals:   01/03/23 1641 01/03/23 1949 01/04/23 0449 01/04/23 0758  BP: (!) 152/98 (!) 151/104 (!) 160/112 (!) 156/104  Pulse: 67 73 77 80  Resp: 18 17 19    Temp: 98.2 F (36.8 C) 98.1 F (36.7 C) 97.8 F (  36.6 C) 97.8 F (36.6 C)  TempSrc: Oral  Oral   SpO2: 100% 100% 100% 100%  Weight:      Height:          Data Reviewed:  Basic Metabolic Panel: Recent Labs  Lab 01/01/23 1841 01/02/23 0556 01/03/23 1110 01/04/23 0548  NA 138 135 139 138  K 4.7 3.6 4.3 5.1  CL 110 109 112* 109  CO2 10*  16* 19* 23  GLUCOSE 83 78 65* 86  BUN 14 9 <5* <5*  CREATININE 1.68* 0.98 0.66 1.09  CALCIUM 9.0 7.9* 8.1* 8.7*  MG 1.5* 1.8 1.2* 1.8  PHOS  --   --  2.6  --     CBC: Recent Labs  Lab 01/01/23 1841 01/02/23 0556 01/03/23 1110 01/04/23 0548  WBC 13.7* 11.1* 7.6 9.9  HGB 11.8* 9.0* 8.9* 9.2*  HCT 38.3* 28.8* 29.9* 29.8*  MCV 90.1 91.7 94.3 93.1  PLT 213 147* 148* 137*    LFT Recent Labs  Lab 01/03/23 1110 01/04/23 0548  AST 33 143*  ALT 32 64*  ALKPHOS 64 104  BILITOT 0.6 0.9  PROT 6.2* 6.4*  ALBUMIN 3.2* 3.3*     Antibiotics: Anti-infectives (From admission, onward)    None        DVT prophylaxis: Lovenox  Code Status: Full code  Family Communication: No family at bedside   CONSULTS    Subjective   Still has some nausea and abdominal pain.   Objective    Physical Examination:   General-appears in no acute distress Heart-S1-S2, regular, no murmur auscultated Lungs-clear to auscultation bilaterally, no wheezing or crackles auscultated Abdomen-soft, positive epigastric tenderness to palpation Extremities-no edema in the lower extremities Neuro-alert, oriented x3, no focal deficit noted  Status is: Inpatient:             Meredeth Ide   [facility] If 7PM-7AM, please contact night-coverage at [email or web address], Office  [phone]   01/04/2023, 10:47 AM  LOS: 2 days

## 2023-01-05 DIAGNOSIS — K861 Other chronic pancreatitis: Secondary | ICD-10-CM | POA: Diagnosis not present

## 2023-01-05 DIAGNOSIS — K859 Acute pancreatitis without necrosis or infection, unspecified: Secondary | ICD-10-CM | POA: Diagnosis not present

## 2023-01-05 LAB — COMPREHENSIVE METABOLIC PANEL
ALT: 44 U/L (ref 0–44)
AST: 56 U/L — ABNORMAL HIGH (ref 15–41)
Albumin: 2.8 g/dL — ABNORMAL LOW (ref 3.5–5.0)
Alkaline Phosphatase: 89 U/L (ref 38–126)
Anion gap: 9 (ref 5–15)
BUN: 8 mg/dL (ref 6–20)
CO2: 21 mmol/L — ABNORMAL LOW (ref 22–32)
Calcium: 8.6 mg/dL — ABNORMAL LOW (ref 8.9–10.3)
Chloride: 104 mmol/L (ref 98–111)
Creatinine, Ser: 1.02 mg/dL (ref 0.61–1.24)
GFR, Estimated: >60 mL/min (ref >60)
Glucose, Bld: 93 mg/dL (ref 70–99)
Potassium: 4.6 mmol/L (ref 3.5–5.1)
Sodium: 134 mmol/L — ABNORMAL LOW (ref 135–145)
Total Bilirubin: 0.4 mg/dL (ref 0.3–1.2)
Total Protein: 5.4 g/dL — ABNORMAL LOW (ref 6.5–8.1)

## 2023-01-05 LAB — GLUCOSE, CAPILLARY: Glucose-Capillary: 72 mg/dL (ref 70–99)

## 2023-01-05 MED ORDER — ADULT MULTIVITAMIN W/MINERALS CH: 2.0000 | ORAL_TABLET | Freq: Every day | ORAL | 0 refills | Status: AC

## 2023-01-05 MED ORDER — LIDOCAINE VISCOUS HCL 2 % MT SOLN: 15.0000 mL | Freq: Four times a day (QID) | OROMUCOSAL | 0 refills | Status: DC | PRN

## 2023-01-05 MED ORDER — GUAIFENESIN 100 MG/5ML PO LIQD: 5.0000 mL | ORAL | 0 refills | Status: DC | PRN

## 2023-01-05 MED ORDER — SUCRALFATE 1 GM/10ML PO SUSP: 1.0000 g | ORAL | 4 refills | Status: DC

## 2023-01-05 MED ORDER — ALUM & MAG HYDROXIDE-SIMETH 200-200-20 MG/5ML PO SUSP: 30.0000 mL | Freq: Four times a day (QID) | ORAL | 0 refills | Status: DC | PRN

## 2023-01-05 MED ORDER — OXYCODONE HCL 5 MG PO TABS: 5.0000 mg | ORAL_TABLET | Freq: Four times a day (QID) | ORAL | 0 refills | Status: DC | PRN

## 2023-01-05 NOTE — Discharge Summary (Signed)
Physician Discharge Summary  [name] ZOX:[id number] DOB: [birth year] DOA: 01/01/2023  PCP: Pcp, No  Admit date: 01/01/2023 Discharge date: 01/05/2023  Admitted From: Home Disposition: Home  Recommendations for Outpatient Follow-up:  Follow up with PCP in 1-2 weeks Please obtain CMP/CBC/magnesium/phosphorus in one week GI to schedule follow-up  Home Health: Not applicable Equipment/Devices: Not applicable  Discharge Condition: Stable CODE STATUS: Full code Diet recommendation: Low-salt diet, soft diet and fluids.  Discharge summary: 52 year old with history of alcohol, cocaine and marijuana use, PAF not on anticoagulation due to substance abuse and nonadherence, WPW syndrome status post ablation, chronic pancreatitis, pancreatic pseudocyst, hypertension, GERD, depression and anxiety history of seizures that are not currently treated presented with another episode of abdominal pain, nausea vomiting.  Patient was admitted to hospital and treated for acute on chronic alcoholic pancreatitis with ongoing alcohol use.  Multiple CT scans with no acute findings.  Lipase was normal as expected from chronic pancreatitis.  Patient stayed in the hospital, treated with IV fluids, IV and oral pain medications and nausea medications with good clinical response.  Did not have any evidence of alcohol withdrawal.  Plan: Reasonably stabilized today tolerating full liquid diet.  His pain is managed with oral pain medication, however prefers to use IV Dilaudid.  Electrolytes are adequate. Able to go home.  Will prescribe short course of oral pain medications, currently not abusing narcotics.  He has been referred to pain management clinic and waiting for schedule. Stay on liquid diet and soft diet. Complete abstinence from alcohol.  Seeking outpatient resources. He will continue Protonix, Pepcid, Carafate.    Discharge Diagnoses:  Active Problems:   Acute on chronic pancreatitis (HCC)   AKI  (acute kidney injury) (HCC)   Paroxysmal atrial fibrillation (HCC)   Pancreatic pseudocyst/cyst   Essential hypertension   Polysubstance abuse (tobacco, cocaine, THC, and ETOH)   Gastroesophageal reflux disease   Hypomagnesemia    Discharge Instructions  Discharge Instructions     Diet - low sodium heart healthy   Complete by: As directed    Discharge instructions   Complete by: As directed    Stay on soft and liquid diet   Increase activity slowly   Complete by: As directed       Allergies as of 01/05/2023       Reactions   Robaxin [methocarbamol] Other (See Comments)   jumpy limbs   Aspirin Other (See Comments)   Unknown reaction   Shellfish-derived Products Nausea And Vomiting, Rash   Trazodone And Nefazodone Other (See Comments)   Muscle spasms   Adhesive [tape] Itching   Latex Itching   Toradol [ketorolac Tromethamine] Other (See Comments)   Has ulcers; cannot have this   Contrast Media [iodinated Contrast Media] Hives   Reglan [metoclopramide] Other (See Comments)   Muscle spasms   Salmon [fish Oil] Nausea And Vomiting, Rash        Medication List     STOP taking these medications    Vitamin D (Ergocalciferol) 1.25 MG (50000 UNIT) Caps capsule Commonly known as: DRISDOL       TAKE these medications    albuterol 108 (90 Base) MCG/ACT inhaler Commonly known as: VENTOLIN HFA Inhale 2 puffs into the lungs every 4 (four) hours as needed for wheezing or shortness of breath.   alum & mag hydroxide-simeth 200-200-20 MG/5ML suspension Commonly known as: MAALOX/MYLANTA Take 30 mLs by mouth every 6 (six) hours as needed for indigestion or heartburn.   famotidine 20 MG  tablet Commonly known as: PEPCID Take 1 tablet (20 mg total) by mouth 2 (two) times daily.   ferrous sulfate 325 (65 FE) MG tablet Take 325 mg by mouth daily.   fluticasone 50 MCG/ACT nasal spray Commonly known as: FLONASE Place 2 sprays into both nostrils in the morning, at noon,  and at bedtime.   folic acid 1 MG tablet Commonly known as: FOLVITE Take 1 tablet (1 mg total) by mouth daily.   guaiFENesin 100 MG/5ML liquid Commonly known as: ROBITUSSIN Take 5 mLs by mouth every 4 (four) hours as needed for cough or to loosen phlegm.   lidocaine 2 % solution Commonly known as: XYLOCAINE Use as directed 15 mLs in the mouth or throat every 6 (six) hours as needed for mouth pain.   lipase/protease/amylase 16109 UNITS Cpep capsule Commonly known as: Creon Take 2 capsules (72,000 Units total) by mouth 3 (three) times daily with meals. May also take 1 capsule (36,000 Units total) as needed (with snacks).   loperamide 2 MG capsule Commonly known as: IMODIUM Take 2 capsules (4 mg total) by mouth as needed for diarrhea or loose stools.   metoprolol 200 MG 24 hr tablet Commonly known as: TOPROL-XL Take 200 mg by mouth daily.   multivitamin with minerals Tabs tablet Take 2 tablets by mouth daily.   nitroGLYCERIN 0.4 MG SL tablet Commonly known as: NITROSTAT Place 0.4 mg under the tongue every 5 (five) minutes as needed for chest pain.   ondansetron 4 MG disintegrating tablet Commonly known as: ZOFRAN-ODT Take 1 tablet (4 mg total) by mouth 2 (two) times daily as needed for nausea or vomiting.   oxyCODONE 5 MG immediate release tablet Commonly known as: Roxicodone Take 1 tablet (5 mg total) by mouth every 6 (six) hours as needed for up to 5 days for severe pain or moderate pain.   pantoprazole 40 MG tablet Commonly known as: Protonix Take 1 tablet (40 mg total) by mouth daily.   pregabalin 100 MG capsule Commonly known as: Lyrica Take 1 capsule (100 mg total) by mouth 2 (two) times daily.   sucralfate 1 GM/10ML suspension Commonly known as: Carafate Take 10 mLs (1 g total) by mouth See admin instructions. Take 4 times with meals and at bedtime.        Follow-up Information     [facility] Follow up on 01/25/2023.   Why:  You are scheduled for a hospital follow up on January 25, 2023 at 9:30 am. Contact information: [address] [place] [address] [phone]               Allergies  Allergen Reactions   Robaxin [Methocarbamol] Other (See Comments)    jumpy limbs   Aspirin Other (See Comments)    Unknown reaction   Shellfish-Derived Products Nausea And Vomiting and Rash   Trazodone And Nefazodone Other (See Comments)    Muscle spasms   Adhesive [Tape] Itching   Latex Itching   Toradol [Ketorolac Tromethamine] Other (See Comments)    Has ulcers; cannot have this   Contrast Media [Iodinated Contrast Media] Hives   Reglan [Metoclopramide] Other (See Comments)    Muscle spasms   Salmon [Fish Oil] Nausea And Vomiting and Rash    Consultations: None   Procedures/Studies: DG Chest 2 View  Result Date: 01/01/2023 CLINICAL DATA:  Chest pain EXAM: CHEST - 2 VIEW COMPARISON:  None Available. FINDINGS: The heart size and mediastinal contours are  within normal limits. No consolidation, pneumothorax or effusion. No edema. The visualized skeletal structures are unremarkable. Overlapping cardiac leads. Air-fluid level along the stomach beneath the left hemidiaphragm. IMPRESSION: No acute cardiopulmonary disease Electronically Signed   By: Karen Kays M.D.   On: 01/01/2023 18:32   DG Chest 2 View  Result Date: 12/17/2022 CLINICAL DATA:  52 year old male with chest pain and dizziness. EXAM: CHEST - 2 VIEW COMPARISON:  Portable chest 12/08/2022 and earlier. FINDINGS: AP and lateral views at 1113 hours. Lung volumes and mediastinal contours are normal. Visualized tracheal air column is within normal limits. Lung markings appear stable and within normal limits, both lungs appear clear. No pneumothorax or pleural effusion. Negative visible bowel gas and osseous structures. IMPRESSION: No acute cardiopulmonary abnormality. Electronically Signed   By: Odessa Fleming M.D.   On:  12/17/2022 11:18   DG Chest Port 1 View  Result Date: 12/08/2022 CLINICAL DATA:  Chest pain EXAM: PORTABLE CHEST 1 VIEW COMPARISON:  11/28/2022 FINDINGS: Artifact from EKG leads. Normal heart size and mediastinal contours. No acute infiltrate or edema. No effusion or pneumothorax. No acute osseous findings. IMPRESSION: No active disease. Electronically Signed   By: Tiburcio Pea M.D.   On: 12/08/2022 06:45   (Echo, Carotid, EGD, Colonoscopy, ERCP)    Subjective: Patient seen in the morning rounds.  Pain is mild to moderate.  He is tolerating full liquid diet as well as snacking on chips.  Discussed about ongoing pain management and encouraged to use oral pain medications.  Discussed about stopping further IV pain medications and monitoring, however patient stated that he feels comfortable going home and he will avoid alcohol and fatty food.  He does have appointment with GI for repeat endoscopy.  Overnight noted to have black stool, FOBT negative.  Last bowel movement early morning today was brown and liquidy.   Discharge Exam: Vitals:   01/05/23 0343 01/05/23 0743  BP: (!) 167/107 (!) 168/108  Pulse: 71 67  Resp:    Temp: 98.2 F (36.8 C) 97.7 F (36.5 C)  SpO2: 97% 99%   Vitals:   01/04/23 2039 01/04/23 2100 01/05/23 0343 01/05/23 0743  BP: (!) 206/119 (!) 167/114 (!) 167/107 (!) 168/108  Pulse: 88 76 71 67  Resp:  18    Temp: 99 F (37.2 C)  98.2 F (36.8 C) 97.7 F (36.5 C)  TempSrc:      SpO2: 100%  97% 99%  Weight:      Height:        General: Pt is alert, awake, not in acute distress Cardiovascular: RRR, S1/S2 +, no rubs, no gallops Respiratory: CTA bilaterally, no wheezing, no rhonchi Abdominal: Soft, mild tenderness on palpation of epigastrium, ND, bowel sounds + Extremities: no edema, no cyanosis    The results of significant diagnostics from this hospitalization (including imaging, microbiology, ancillary and laboratory) are listed below for reference.      Microbiology: No results found for this or any previous visit (from the past 240 hour(s)).   Labs: BNP (last 3 results) Recent Labs    01/20/22 1700  BNP 116.7*   Basic Metabolic Panel: Recent Labs  Lab 01/01/23 1841 01/02/23 0556 01/03/23 1110 01/04/23 0548 01/05/23 0158  NA 138 135 139 138 134*  K 4.7 3.6 4.3 5.1 4.6  CL 110 109 112* 109 104  CO2 10* 16* 19* 23 21*  GLUCOSE 83 78 65* 86 93  BUN 14 9 <5* <5* 8  CREATININE 1.68* 0.98 0.66  1.09 1.02  CALCIUM 9.0 7.9* 8.1* 8.7* 8.6*  MG 1.5* 1.8 1.2* 1.8  --   PHOS  --   --  2.6  --   --    Liver Function Tests: Recent Labs  Lab 01/03/23 1110 01/04/23 0548 01/05/23 0158  AST 33 143* 56*  ALT 32 64* 44  ALKPHOS 64 104 89  BILITOT 0.6 0.9 0.4  PROT 6.2* 6.4* 5.4*  ALBUMIN 3.2* 3.3* 2.8*   Recent Labs  Lab 01/01/23 1841  LIPASE 20   No results for input(s): "AMMONIA" in the last 168 hours. CBC: Recent Labs  Lab 01/01/23 1841 01/02/23 0556 01/03/23 1110 01/04/23 0548  WBC 13.7* 11.1* 7.6 9.9  HGB 11.8* 9.0* 8.9* 9.2*  HCT 38.3* 28.8* 29.9* 29.8*  MCV 90.1 91.7 94.3 93.1  PLT 213 147* 148* 137*   Cardiac Enzymes: No results for input(s): "CKTOTAL", "CKMB", "CKMBINDEX", "TROPONINI" in the last 168 hours. BNP: Invalid input(s): "POCBNP" CBG: Recent Labs  Lab 01/03/23 2049 01/04/23 0759 01/04/23 1234 01/04/23 1627 01/05/23 0743  GLUCAP 76 122* 100* 83 72   D-Dimer No results for input(s): "DDIMER" in the last 72 hours. Hgb A1c No results for input(s): "HGBA1C" in the last 72 hours. Lipid Profile No results for input(s): "CHOL", "HDL", "LDLCALC", "TRIG", "CHOLHDL", "LDLDIRECT" in the last 72 hours. Thyroid function studies No results for input(s): "TSH", "T4TOTAL", "T3FREE", "THYROIDAB" in the last 72 hours.  Invalid input(s): "FREET3" Anemia work up No results for input(s): "VITAMINB12", "FOLATE", "FERRITIN", "TIBC", "IRON", "RETICCTPCT" in the last 72 hours. Urinalysis    Component  Value Date/Time   COLORURINE YELLOW 12/17/2022 1046   APPEARANCEUR CLEAR 12/17/2022 1046   LABSPEC 1.019 12/17/2022 1046   PHURINE 5.0 12/17/2022 1046   GLUCOSEU NEGATIVE 12/17/2022 1046   HGBUR NEGATIVE 12/17/2022 1046   HGBUR negative 04/30/2010 1020   BILIRUBINUR NEGATIVE 12/17/2022 1046   BILIRUBINUR negative 09/06/2019 1649   KETONESUR NEGATIVE 12/17/2022 1046   PROTEINUR NEGATIVE 12/17/2022 1046   UROBILINOGEN 0.2 09/06/2019 1649   UROBILINOGEN 0.2 03/15/2015 0508   NITRITE NEGATIVE 12/17/2022 1046   LEUKOCYTESUR NEGATIVE 12/17/2022 1046   Sepsis Labs Recent Labs  Lab 01/01/23 1841 01/02/23 0556 01/03/23 1110 01/04/23 0548  WBC 13.7* 11.1* 7.6 9.9   Microbiology No results found for this or any previous visit (from the past 240 hour(s)).   Time coordinating discharge: 35 minutes  SIGNED:   Dorcas Carrow, MD  [facility] 01/05/2023, 10:27 AM

## 2023-01-05 NOTE — Plan of Care (Signed)
°  Problem: Education: °Goal: Knowledge of General Education information will improve °Description: Including pain rating scale, medication(s)/side effects and non-pharmacologic comfort measures °Outcome: Progressing °  °Problem: Health Behavior/Discharge Planning: °Goal: Ability to manage health-related needs will improve °Outcome: Progressing °  °Problem: Clinical Measurements: °Goal: Ability to maintain clinical measurements within normal limits will improve °Outcome: Progressing °Goal: Will remain free from infection °Outcome: Progressing °Goal: Diagnostic test results will improve °Outcome: Progressing °Goal: Respiratory complications will improve °Outcome: Progressing °Goal: Cardiovascular complication will be avoided °Outcome: Progressing °  °Problem: Activity: °Goal: Risk for activity intolerance will decrease °Outcome: Progressing °  °Problem: Nutrition: °Goal: Adequate nutrition will be maintained °Outcome: Progressing °  °Problem: Coping: °Goal: Level of anxiety will decrease °Outcome: Progressing °  °Problem: Elimination: °Goal: Will not experience complications related to bowel motility °Outcome: Progressing °Goal: Will not experience complications related to urinary retention °Outcome: Progressing °  °Problem: Pain Managment: °Goal: General experience of comfort will improve °Outcome: Progressing °  °Problem: Safety: °Goal: Ability to remain free from injury will improve °Outcome: Progressing °  °

## 2023-01-08 ENCOUNTER — Emergency Department (HOSPITAL_COMMUNITY): Payer: MEDICAID

## 2023-01-08 ENCOUNTER — Inpatient Hospital Stay (HOSPITAL_COMMUNITY)
Admission: EM | Admit: 2023-01-08 | Discharge: 2023-01-11 | DRG: 389 | Disposition: A | Discharge status: Home / Self Care | Payer: MEDICAID | Attending: Internal Medicine | Admitting: Internal Medicine

## 2023-01-08 ENCOUNTER — Encounter (HOSPITAL_COMMUNITY): Payer: Self-pay

## 2023-01-08 ENCOUNTER — Other Ambulatory Visit: Payer: Self-pay

## 2023-01-08 DIAGNOSIS — Z91041 Radiographic dye allergy status: Secondary | ICD-10-CM

## 2023-01-08 DIAGNOSIS — K860 Alcohol-induced chronic pancreatitis: Secondary | ICD-10-CM | POA: Diagnosis present

## 2023-01-08 DIAGNOSIS — D573 Sickle-cell trait: Secondary | ICD-10-CM | POA: Diagnosis present

## 2023-01-08 DIAGNOSIS — I10 Essential (primary) hypertension: Secondary | ICD-10-CM | POA: Diagnosis present

## 2023-01-08 DIAGNOSIS — K56609 Unspecified intestinal obstruction, unspecified as to partial versus complete obstruction: Secondary | ICD-10-CM

## 2023-01-08 DIAGNOSIS — G8929 Other chronic pain: Secondary | ICD-10-CM | POA: Diagnosis present

## 2023-01-08 DIAGNOSIS — Z886 Allergy status to analgesic agent status: Secondary | ICD-10-CM

## 2023-01-08 DIAGNOSIS — F1010 Alcohol abuse, uncomplicated: Secondary | ICD-10-CM | POA: Diagnosis present

## 2023-01-08 DIAGNOSIS — Z808 Family history of malignant neoplasm of other organs or systems: Secondary | ICD-10-CM

## 2023-01-08 DIAGNOSIS — Z885 Allergy status to narcotic agent status: Secondary | ICD-10-CM

## 2023-01-08 DIAGNOSIS — I456 Pre-excitation syndrome: Secondary | ICD-10-CM | POA: Diagnosis present

## 2023-01-08 DIAGNOSIS — K863 Pseudocyst of pancreas: Secondary | ICD-10-CM | POA: Diagnosis present

## 2023-01-08 DIAGNOSIS — Z79899 Other long term (current) drug therapy: Secondary | ICD-10-CM

## 2023-01-08 DIAGNOSIS — I480 Paroxysmal atrial fibrillation: Secondary | ICD-10-CM | POA: Diagnosis present

## 2023-01-08 DIAGNOSIS — E7800 Pure hypercholesterolemia, unspecified: Secondary | ICD-10-CM | POA: Diagnosis present

## 2023-01-08 DIAGNOSIS — G40909 Epilepsy, unspecified, not intractable, without status epilepticus: Secondary | ICD-10-CM | POA: Diagnosis present

## 2023-01-08 DIAGNOSIS — Z91048 Other nonmedicinal substance allergy status: Secondary | ICD-10-CM

## 2023-01-08 DIAGNOSIS — Z888 Allergy status to other drugs, medicaments and biological substances status: Secondary | ICD-10-CM

## 2023-01-08 DIAGNOSIS — Z8711 Personal history of peptic ulcer disease: Secondary | ICD-10-CM

## 2023-01-08 DIAGNOSIS — Z811 Family history of alcohol abuse and dependence: Secondary | ICD-10-CM

## 2023-01-08 DIAGNOSIS — F419 Anxiety disorder, unspecified: Secondary | ICD-10-CM | POA: Diagnosis present

## 2023-01-08 DIAGNOSIS — R451 Restlessness and agitation: Secondary | ICD-10-CM | POA: Diagnosis present

## 2023-01-08 DIAGNOSIS — K861 Other chronic pancreatitis: Secondary | ICD-10-CM | POA: Diagnosis present

## 2023-01-08 DIAGNOSIS — K567 Ileus, unspecified: Principal | ICD-10-CM | POA: Diagnosis present

## 2023-01-08 DIAGNOSIS — F17290 Nicotine dependence, other tobacco product, uncomplicated: Secondary | ICD-10-CM | POA: Diagnosis present

## 2023-01-08 DIAGNOSIS — F319 Bipolar disorder, unspecified: Secondary | ICD-10-CM | POA: Diagnosis present

## 2023-01-08 DIAGNOSIS — F1210 Cannabis abuse, uncomplicated: Secondary | ICD-10-CM | POA: Diagnosis present

## 2023-01-08 DIAGNOSIS — F1090 Alcohol use, unspecified, uncomplicated: Secondary | ICD-10-CM | POA: Insufficient documentation

## 2023-01-08 DIAGNOSIS — Z91040 Latex allergy status: Secondary | ICD-10-CM

## 2023-01-08 DIAGNOSIS — J4489 Other specified chronic obstructive pulmonary disease: Secondary | ICD-10-CM | POA: Diagnosis present

## 2023-01-08 DIAGNOSIS — F1410 Cocaine abuse, uncomplicated: Secondary | ICD-10-CM | POA: Diagnosis present

## 2023-01-08 DIAGNOSIS — Z91013 Allergy to seafood: Secondary | ICD-10-CM

## 2023-01-08 DIAGNOSIS — M549 Dorsalgia, unspecified: Secondary | ICD-10-CM | POA: Diagnosis present

## 2023-01-08 DIAGNOSIS — R1013 Epigastric pain: Secondary | ICD-10-CM | POA: Diagnosis present

## 2023-01-08 DIAGNOSIS — K219 Gastro-esophageal reflux disease without esophagitis: Secondary | ICD-10-CM | POA: Insufficient documentation

## 2023-01-08 DIAGNOSIS — Z8249 Family history of ischemic heart disease and other diseases of the circulatory system: Secondary | ICD-10-CM

## 2023-01-08 DIAGNOSIS — J45909 Unspecified asthma, uncomplicated: Secondary | ICD-10-CM

## 2023-01-08 DIAGNOSIS — F17210 Nicotine dependence, cigarettes, uncomplicated: Secondary | ICD-10-CM | POA: Diagnosis present

## 2023-01-08 LAB — HEPATIC FUNCTION PANEL
ALT: 28 U/L (ref 0–44)
AST: 30 U/L (ref 15–41)
Albumin: 3.4 g/dL — ABNORMAL LOW (ref 3.5–5.0)
Alkaline Phosphatase: 84 U/L (ref 38–126)
Bilirubin, Direct: <0.1 mg/dL (ref 0.0–0.2)
Total Bilirubin: 0.6 mg/dL (ref 0.3–1.2)
Total Protein: 7 g/dL (ref 6.5–8.1)

## 2023-01-08 LAB — CBC
HCT: 30.4 % — ABNORMAL LOW (ref 39.0–52.0)
Hemoglobin: 9.5 g/dL — ABNORMAL LOW (ref 13.0–17.0)
MCH: 27.9 pg (ref 26.0–34.0)
MCHC: 31.3 g/dL (ref 30.0–36.0)
MCV: 89.4 fL (ref 80.0–100.0)
Platelets: 163 10*3/uL (ref 150–400)
RBC: 3.4 MIL/uL — ABNORMAL LOW (ref 4.22–5.81)
RDW: 18.1 % — ABNORMAL HIGH (ref 11.5–15.5)
WBC: 8.4 10*3/uL (ref 4.0–10.5)
nRBC: 0 % (ref 0.0–0.2)

## 2023-01-08 LAB — URINALYSIS, ROUTINE W REFLEX MICROSCOPIC
Bilirubin Urine: NEGATIVE
Glucose, UA: NEGATIVE mg/dL
Hgb urine dipstick: NEGATIVE
Ketones, ur: NEGATIVE mg/dL
Leukocytes,Ua: NEGATIVE
Nitrite: NEGATIVE
Protein, ur: NEGATIVE mg/dL
Specific Gravity, Urine: 1.014 (ref 1.005–1.030)
pH: 5 (ref 5.0–8.0)

## 2023-01-08 LAB — BASIC METABOLIC PANEL
Anion gap: 13 (ref 5–15)
BUN: 9 mg/dL (ref 6–20)
CO2: 20 mmol/L — ABNORMAL LOW (ref 22–32)
Calcium: 8.3 mg/dL — ABNORMAL LOW (ref 8.9–10.3)
Chloride: 104 mmol/L (ref 98–111)
Creatinine, Ser: 0.88 mg/dL (ref 0.61–1.24)
GFR, Estimated: >60 mL/min (ref >60)
Glucose, Bld: 83 mg/dL (ref 70–99)
Potassium: 3.8 mmol/L (ref 3.5–5.1)
Sodium: 137 mmol/L (ref 135–145)

## 2023-01-08 LAB — TROPONIN I (HIGH SENSITIVITY)
Troponin I (High Sensitivity): 4 ng/L (ref <18)
Troponin I (High Sensitivity): 4 ng/L (ref <18)

## 2023-01-08 LAB — LIPASE, BLOOD: Lipase: 22 U/L (ref 11–51)

## 2023-01-08 MED ORDER — LACTATED RINGERS IV BOLUS
1000.0000 mL | Freq: Once | INTRAVENOUS | Status: AC
  Administered 2023-01-09: 1000 mL via INTRAVENOUS

## 2023-01-08 MED ORDER — ONDANSETRON HCL 4 MG/2ML IJ SOLN
4.0000 mg | Freq: Once | INTRAMUSCULAR | Status: AC
  Administered 2023-01-09: 4 mg via INTRAVENOUS
  Filled 2023-01-08: qty 2

## 2023-01-08 MED ORDER — HYDROMORPHONE HCL 1 MG/ML IJ SOLN
1.0000 mg | Freq: Once | INTRAMUSCULAR | Status: AC
  Administered 2023-01-09: 1 mg via INTRAVENOUS
  Filled 2023-01-08 (×2): qty 1

## 2023-01-08 NOTE — ED Provider Notes (Signed)
[facility] Emergency Department Provider Note MRN:  [id number]  Arrival date & time: 01/09/23     Chief Complaint   Chest Pain   History of Present Illness   [name] is a 52 y.o. year-old male presents to the ED with chief complaint of epigastric abdominal pain.  Hx of chronic pancreatitis.  Was just admitted for the same and discharged on 8/29.  States that it was suggested he stay in the hospital a few days longer, but he wanted to go home and was discharged.  He states that since getting discharged he has continued to vomit.  He has been trying to take his home medications, but states that they come right back up.  History provided by patient.   Review of Systems  Pertinent positive and negative review of systems noted in HPI.    Physical Exam   Vitals:   01/08/23 2215 01/09/23 0137  BP: (!) 151/93 (!) 154/100  Pulse: 63 68  Resp: 17 17  Temp: 97.8 F (36.6 C) 98.4 F (36.9 C)  SpO2: 100% 100%    CONSTITUTIONAL:  non toxic-appearing, NAD NEURO:  Alert and oriented x 3, CN 3-12 grossly intact EYES:  eyes equal and reactive ENT/NECK:  Supple, no stridor  CARDIO:  normal rate, regular rhythm, appears well-perfused  PULM:  No respiratory distress, CTAB GI/GU:  non-distended, epigastric and left upper abdominal tenderness MSK/SPINE:  No gross deformities, no edema, moves all extremities  SKIN:  no rash, atraumatic   *Additional and/or pertinent findings included in MDM below  Diagnostic and Interventional Summary    EKG Interpretation Date/Time:  Sunday January 08 2023 18:34:01 EDT Ventricular Rate:  65 PR Interval:  106 QRS Duration:  76 QT Interval:  394 QTC Calculation: 409 R Axis:   75  Text Interpretation: Sinus rhythm with short PR T wave abnormality, consider anterior ischemia Abnormal ECG When compared with ECG of 01-Jan-2023 17:23, No significant change since last tracing Confirmed by Meridee Score [phone]) on  01/08/2023 10:00:31 PM       Labs Reviewed  BASIC METABOLIC PANEL - Abnormal; Notable for the following components:      Result Value   CO2 20 (*)    Calcium 8.3 (*)    All other components within normal limits  CBC - Abnormal; Notable for the following components:   RBC 3.40 (*)    Hemoglobin 9.5 (*)    HCT 30.4 (*)    RDW 18.1 (*)    All other components within normal limits  HEPATIC FUNCTION PANEL - Abnormal; Notable for the following components:   Albumin 3.4 (*)    All other components within normal limits  LIPASE, BLOOD  URINALYSIS, ROUTINE W REFLEX MICROSCOPIC  TROPONIN I (HIGH SENSITIVITY)  TROPONIN I (HIGH SENSITIVITY)    CT ABDOMEN PELVIS WO CONTRAST  Final Result    DG Chest 2 View  Final Result      Medications  lactated ringers bolus 1,000 mL (1,000 mLs Intravenous New Bag/Given 01/09/23 0127)  HYDROmorphone (DILAUDID) injection 1 mg (1 mg Intravenous Given 01/09/23 0136)  ondansetron (ZOFRAN) injection 4 mg (4 mg Intravenous Given 01/09/23 0126)     Procedures  /  Critical Care Procedures  ED Course and Medical Decision Making  I have reviewed the triage vital signs, the nursing notes, and pertinent available records from the EMR.  Social Determinants Affecting Complexity of Care: Patient has no clinically significant social determinants affecting this chief complaint.[name]  ED Course:    Medical Decision Making Amount and/or Complexity of Data Reviewed Labs: ordered. Radiology: ordered.  Risk Prescription drug management. Decision regarding hospitalization.         Consultants: I consulted with Hospitalist, Dr. Loney Loh, who is appreciated for admitting. I consulted with Dr. Royanne Foots, from general surgery, who is appreciated for consulting.   Treatment and Plan: Patient's exam and diagnostic results are concerning for vomiting and epigastric pain.  Feel that patient will need admission to the hospital for further treatment and  evaluation.    Final Clinical Impressions(s) / ED Diagnoses     ICD-10-CM   1. Epigastric pain  R10.13       ED Discharge Orders     None         Discharge Instructions Discussed with and Provided to Patient:   Discharge Instructions   None      Roxy Horseman, PA-C 01/09/23 0142    Gilda Crease, MD 01/11/23 [phone]

## 2023-01-08 NOTE — ED Triage Notes (Signed)
Pt c/o left sided chest pain that radiates to left shoulder started today. Pt also c/o epigastric pain, N/V/D started today. Pt didn't take the prescribed nitroglycerin at home, because he took viagra at 1300 today. EMS gave ASA 324 mg.

## 2023-01-08 NOTE — ED Provider Triage Note (Signed)
Emergency Medicine Provider Triage Evaluation Note  Vue Khamoni Deutscher , a 52 y.o. male  was evaluated in triage.  Pt complains of epigastric pain that radiates to his back.  Patient discharged from hospital on 8/29, was admitted for alcoholic pancreatitis and gastritis.  He continues to complain of similar pain, nausea, vomiting, and diarrhea.  Denies alcohol or drug use, fevers.    Review of Systems  Positive: As above Negative: As above  Physical Exam  BP 137/83   Pulse 63   Temp 98.9 F (37.2 C) (Oral)   Resp 17   Ht 5\' 8"  (1.727 m)   Wt 55.8 kg   SpO2 100%   BMI 18.70 kg/m  Gen:   Awake, no distress   Resp:  Normal effort  MSK:   Moves extremities without difficulty  Other:    Medical Decision Making  Medically screening exam initiated at 6:45 PM.  Appropriate orders placed.  Jefry Suchir Devries was informed that the remainder of the evaluation will be completed by another provider, this initial triage assessment does not replace that evaluation, and the importance of remaining in the ED until their evaluation is complete.     Lenard Simmer, [place] 01/08/23 [phone]

## 2023-01-09 ENCOUNTER — Encounter (HOSPITAL_COMMUNITY): Payer: Self-pay | Admitting: Internal Medicine

## 2023-01-09 ENCOUNTER — Inpatient Hospital Stay (HOSPITAL_COMMUNITY): Payer: MEDICAID

## 2023-01-09 ENCOUNTER — Emergency Department (HOSPITAL_COMMUNITY): Payer: MEDICAID

## 2023-01-09 DIAGNOSIS — I10 Essential (primary) hypertension: Secondary | ICD-10-CM | POA: Diagnosis present

## 2023-01-09 DIAGNOSIS — Z888 Allergy status to other drugs, medicaments and biological substances status: Secondary | ICD-10-CM | POA: Diagnosis not present

## 2023-01-09 DIAGNOSIS — R1013 Epigastric pain: Secondary | ICD-10-CM | POA: Diagnosis present

## 2023-01-09 DIAGNOSIS — F17210 Nicotine dependence, cigarettes, uncomplicated: Secondary | ICD-10-CM | POA: Diagnosis present

## 2023-01-09 DIAGNOSIS — K567 Ileus, unspecified: Secondary | ICD-10-CM | POA: Diagnosis present

## 2023-01-09 DIAGNOSIS — K860 Alcohol-induced chronic pancreatitis: Secondary | ICD-10-CM | POA: Diagnosis present

## 2023-01-09 DIAGNOSIS — G40909 Epilepsy, unspecified, not intractable, without status epilepticus: Secondary | ICD-10-CM | POA: Diagnosis present

## 2023-01-09 DIAGNOSIS — K219 Gastro-esophageal reflux disease without esophagitis: Secondary | ICD-10-CM | POA: Diagnosis present

## 2023-01-09 DIAGNOSIS — F1010 Alcohol abuse, uncomplicated: Secondary | ICD-10-CM | POA: Diagnosis present

## 2023-01-09 DIAGNOSIS — E7800 Pure hypercholesterolemia, unspecified: Secondary | ICD-10-CM | POA: Diagnosis present

## 2023-01-09 DIAGNOSIS — Z8711 Personal history of peptic ulcer disease: Secondary | ICD-10-CM | POA: Diagnosis not present

## 2023-01-09 DIAGNOSIS — Z886 Allergy status to analgesic agent status: Secondary | ICD-10-CM | POA: Diagnosis not present

## 2023-01-09 DIAGNOSIS — Z91013 Allergy to seafood: Secondary | ICD-10-CM | POA: Diagnosis not present

## 2023-01-09 DIAGNOSIS — F1410 Cocaine abuse, uncomplicated: Secondary | ICD-10-CM | POA: Diagnosis present

## 2023-01-09 DIAGNOSIS — F319 Bipolar disorder, unspecified: Secondary | ICD-10-CM | POA: Diagnosis present

## 2023-01-09 DIAGNOSIS — J4489 Other specified chronic obstructive pulmonary disease: Secondary | ICD-10-CM | POA: Diagnosis present

## 2023-01-09 DIAGNOSIS — J45909 Unspecified asthma, uncomplicated: Secondary | ICD-10-CM

## 2023-01-09 DIAGNOSIS — F17290 Nicotine dependence, other tobacco product, uncomplicated: Secondary | ICD-10-CM | POA: Diagnosis present

## 2023-01-09 DIAGNOSIS — I480 Paroxysmal atrial fibrillation: Secondary | ICD-10-CM | POA: Diagnosis present

## 2023-01-09 DIAGNOSIS — Z91041 Radiographic dye allergy status: Secondary | ICD-10-CM | POA: Diagnosis not present

## 2023-01-09 DIAGNOSIS — G8929 Other chronic pain: Secondary | ICD-10-CM | POA: Diagnosis present

## 2023-01-09 DIAGNOSIS — K56609 Unspecified intestinal obstruction, unspecified as to partial versus complete obstruction: Secondary | ICD-10-CM | POA: Diagnosis not present

## 2023-01-09 DIAGNOSIS — M549 Dorsalgia, unspecified: Secondary | ICD-10-CM | POA: Diagnosis present

## 2023-01-09 DIAGNOSIS — D573 Sickle-cell trait: Secondary | ICD-10-CM | POA: Diagnosis present

## 2023-01-09 DIAGNOSIS — F1210 Cannabis abuse, uncomplicated: Secondary | ICD-10-CM | POA: Diagnosis present

## 2023-01-09 DIAGNOSIS — I456 Pre-excitation syndrome: Secondary | ICD-10-CM | POA: Diagnosis present

## 2023-01-09 DIAGNOSIS — K863 Pseudocyst of pancreas: Secondary | ICD-10-CM | POA: Diagnosis present

## 2023-01-09 LAB — BASIC METABOLIC PANEL
Anion gap: 11 (ref 5–15)
BUN: 7 mg/dL (ref 6–20)
CO2: 21 mmol/L — ABNORMAL LOW (ref 22–32)
Calcium: 8.2 mg/dL — ABNORMAL LOW (ref 8.9–10.3)
Chloride: 105 mmol/L (ref 98–111)
Creatinine, Ser: 0.8 mg/dL (ref 0.61–1.24)
GFR, Estimated: >60 mL/min (ref >60)
Glucose, Bld: 89 mg/dL (ref 70–99)
Potassium: 3.5 mmol/L (ref 3.5–5.1)
Sodium: 137 mmol/L (ref 135–145)

## 2023-01-09 LAB — CBC
HCT: 30.9 % — ABNORMAL LOW (ref 39.0–52.0)
Hemoglobin: 9.4 g/dL — ABNORMAL LOW (ref 13.0–17.0)
MCH: 27.2 pg (ref 26.0–34.0)
MCHC: 30.4 g/dL (ref 30.0–36.0)
MCV: 89.3 fL (ref 80.0–100.0)
Platelets: 159 10*3/uL (ref 150–400)
RBC: 3.46 MIL/uL — ABNORMAL LOW (ref 4.22–5.81)
RDW: 18.3 % — ABNORMAL HIGH (ref 11.5–15.5)
WBC: 7.3 10*3/uL (ref 4.0–10.5)
nRBC: 0 % (ref 0.0–0.2)

## 2023-01-09 MED ORDER — SUCRALFATE 1 GM/10ML PO SUSP
1.0000 g | Freq: Three times a day (TID) | ORAL | Status: DC
  Administered 2023-01-09 – 2023-01-11 (×8): 1 g via ORAL
  Filled 2023-01-09 (×8): qty 10

## 2023-01-09 MED ORDER — PREGABALIN 100 MG PO CAPS
100.0000 mg | ORAL_CAPSULE | Freq: Two times a day (BID) | ORAL | Status: DC
  Administered 2023-01-09 – 2023-01-11 (×6): 100 mg via ORAL
  Filled 2023-01-09 (×6): qty 1

## 2023-01-09 MED ORDER — PANCRELIPASE (LIP-PROT-AMYL) 36000-114000 UNITS PO CPEP
72000.0000 [IU] | ORAL_CAPSULE | Freq: Three times a day (TID) | ORAL | Status: DC
  Administered 2023-01-09 – 2023-01-11 (×8): 72000 [IU] via ORAL
  Filled 2023-01-09 (×9): qty 2

## 2023-01-09 MED ORDER — KCL IN DEXTROSE-NACL 20-5-0.9 MEQ/L-%-% IV SOLN
INTRAVENOUS | Status: DC
  Filled 2023-01-09 (×3): qty 1000

## 2023-01-09 MED ORDER — HYDRALAZINE HCL 20 MG/ML IJ SOLN
10.0000 mg | INTRAMUSCULAR | Status: DC | PRN
  Administered 2023-01-09: 10 mg via INTRAVENOUS
  Filled 2023-01-09: qty 1

## 2023-01-09 MED ORDER — ACETAMINOPHEN 325 MG PO TABS
650.0000 mg | ORAL_TABLET | Freq: Four times a day (QID) | ORAL | Status: DC | PRN
  Administered 2023-01-09: 650 mg via ORAL
  Filled 2023-01-09 (×2): qty 2

## 2023-01-09 MED ORDER — THIAMINE HCL 100 MG/ML IJ SOLN: 100.0000 mg | Freq: Every day | INTRAMUSCULAR | Status: DC

## 2023-01-09 MED ORDER — DIATRIZOATE MEGLUMINE & SODIUM 66-10 % PO SOLN
90.0000 mL | Freq: Once | ORAL | Status: AC
  Administered 2023-01-09: 90 mL via ORAL
  Filled 2023-01-09: qty 90

## 2023-01-09 MED ORDER — LORAZEPAM 1 MG PO TABS
1.0000 mg | ORAL_TABLET | ORAL | Status: DC | PRN
  Administered 2023-01-10 – 2023-01-11 (×4): 1 mg via ORAL
  Filled 2023-01-09 (×4): qty 1

## 2023-01-09 MED ORDER — GERHARDT'S BUTT CREAM
TOPICAL_CREAM | Freq: Three times a day (TID) | CUTANEOUS | Status: DC | PRN
  Filled 2023-01-09: qty 1

## 2023-01-09 MED ORDER — THIAMINE MONONITRATE 100 MG PO TABS
100.0000 mg | ORAL_TABLET | Freq: Every day | ORAL | Status: DC
  Administered 2023-01-09 – 2023-01-11 (×3): 100 mg via ORAL
  Filled 2023-01-09 (×3): qty 1

## 2023-01-09 MED ORDER — HYDROMORPHONE HCL 1 MG/ML IJ SOLN
1.0000 mg | INTRAMUSCULAR | Status: DC | PRN
  Administered 2023-01-09 – 2023-01-10 (×5): 1 mg via INTRAVENOUS
  Filled 2023-01-09 (×5): qty 1

## 2023-01-09 MED ORDER — NALOXONE HCL 0.4 MG/ML IJ SOLN: 0.4000 mg | INTRAMUSCULAR | Status: DC | PRN

## 2023-01-09 MED ORDER — ALBUTEROL SULFATE (2.5 MG/3ML) 0.083% IN NEBU: 2.5000 mg | INHALATION_SOLUTION | RESPIRATORY_TRACT | Status: DC | PRN

## 2023-01-09 MED ORDER — FOLIC ACID 1 MG PO TABS
1.0000 mg | ORAL_TABLET | Freq: Every day | ORAL | Status: DC
  Administered 2023-01-09 – 2023-01-11 (×3): 1 mg via ORAL
  Filled 2023-01-09 (×3): qty 1

## 2023-01-09 MED ORDER — SODIUM CHLORIDE 0.9 % IV SOLN: INTRAVENOUS | Status: DC

## 2023-01-09 MED ORDER — ONDANSETRON HCL 4 MG/2ML IJ SOLN
4.0000 mg | Freq: Four times a day (QID) | INTRAMUSCULAR | Status: DC | PRN
  Administered 2023-01-10: 4 mg via INTRAVENOUS
  Filled 2023-01-09: qty 2

## 2023-01-09 MED ORDER — METOPROLOL SUCCINATE ER 50 MG PO TB24
200.0000 mg | ORAL_TABLET | Freq: Every day | ORAL | Status: DC
  Administered 2023-01-09 – 2023-01-11 (×2): 200 mg via ORAL
  Filled 2023-01-09 (×3): qty 4

## 2023-01-09 MED ORDER — ACETAMINOPHEN 650 MG RE SUPP: 650.0000 mg | Freq: Four times a day (QID) | RECTAL | Status: DC | PRN

## 2023-01-09 MED ORDER — NICOTINE 21 MG/24HR TD PT24: 21.0000 mg | MEDICATED_PATCH | Freq: Every day | TRANSDERMAL | Status: DC | PRN

## 2023-01-09 MED ORDER — HYDROMORPHONE HCL 1 MG/ML IJ SOLN
1.0000 mg | INTRAMUSCULAR | Status: DC | PRN
  Administered 2023-01-09 (×2): 1 mg via INTRAVENOUS
  Filled 2023-01-09 (×2): qty 1

## 2023-01-09 MED ORDER — PANTOPRAZOLE SODIUM 40 MG PO TBEC
40.0000 mg | DELAYED_RELEASE_TABLET | Freq: Every day | ORAL | Status: DC
  Administered 2023-01-09 – 2023-01-11 (×3): 40 mg via ORAL
  Filled 2023-01-09 (×3): qty 1

## 2023-01-09 MED ORDER — LORAZEPAM 2 MG/ML IJ SOLN
1.0000 mg | INTRAMUSCULAR | Status: DC | PRN
  Administered 2023-01-09: 1 mg via INTRAVENOUS
  Administered 2023-01-10: 2 mg via INTRAVENOUS
  Filled 2023-01-09 (×2): qty 1

## 2023-01-09 MED ORDER — PANCRELIPASE (LIP-PROT-AMYL) 36000-114000 UNITS PO CPEP: 36000.0000 [IU] | ORAL_CAPSULE | ORAL | Status: DC | PRN

## 2023-01-09 MED ORDER — ADULT MULTIVITAMIN W/MINERALS CH
1.0000 | ORAL_TABLET | Freq: Every day | ORAL | Status: DC
  Administered 2023-01-09 – 2023-01-11 (×3): 1 via ORAL
  Filled 2023-01-09 (×3): qty 1

## 2023-01-09 NOTE — Plan of Care (Signed)

## 2023-01-09 NOTE — Progress Notes (Signed)
Dr. Julian Reil notified regarding Pt BP. Pt denies CP at this time. Awaiting new orders. Nursing plan of care ongoing.

## 2023-01-09 NOTE — Consult Note (Signed)
Consulting Physician: Hyman Hopes Kalev Temme  Referring Provider: Roxy Horseman, PA-C  Chief Complaint: Abdominal pain  Reason for Consult: Small bowel obstruction or ileus   Subjective   HPI: [name] is an 52 y.o. male who is here for abdominal pain.  The pain is located in the right mid to low abdomen.  He also feels bloated.  He had some diarrhea recently then some clay light colored stools.  He has a history of alcoholic chronic pancreatitis,  but says this pain is different than pancreatitis pain.  Past Medical History:  Diagnosis Date   Alcoholism /alcohol abuse    Anemia    Anxiety    Arthritis    knees; arms; elbows (03/26/2015)   Asthma    Bipolar disorder (HCC)    Chronic bronchitis (HCC)    Chronic lower back pain    Chronic pancreatitis (HCC)    Cocaine abuse (HCC)    Depression    Family history of adverse reaction to anesthesia    Femoral condyle fracture (HCC) 03/08/2014   left medial/notes 03/09/2014   GERD (gastroesophageal reflux disease)    H/O hiatal hernia    H/O suicide attempt 10/2012   High cholesterol    History of blood transfusion 10/2012   when I tried to commit suicide   History of stomach ulcers    Hypertension    Marijuana abuse, continuous    Migraine    a few times/year (03/26/2015)   Pneumonia 1990's X 3   PTSD (post-traumatic stress disorder)    Seizures (HCC)    Sickle cell trait (HCC)    WPW (Wolff-Parkinson-White syndrome)    [name] 03/06/2013    Past Surgical History:  Procedure Laterality Date   BIOPSY  11/25/2017   Procedure: BIOPSY;  Surgeon: [name] Modena, MD;  Location: [facility] ENDOSCOPY;  Service: Endoscopy;;   BIOPSY  10/14/2018   Procedure: BIOPSY;  Surgeon: [name] Modena, MD;  Location: [facility] ENDOSCOPY;  Service: Endoscopy;;   CARDIAC CATHETERIZATION     CYST ENTEROSTOMY  01/02/2020   Procedure: CYST ASPIRATION;  Surgeon: Rachael Fee, MD;  Location: [facility] ENDOSCOPY;  Service: Endoscopy;;    ESOPHAGOGASTRODUODENOSCOPY (EGD) WITH PROPOFOL N/A 11/25/2017   Procedure: ESOPHAGOGASTRODUODENOSCOPY (EGD) WITH PROPOFOL;  Surgeon: [name] Modena, MD;  Location: [facility] ENDOSCOPY;  Service: Endoscopy;  Laterality: N/A;   ESOPHAGOGASTRODUODENOSCOPY (EGD) WITH PROPOFOL Left 10/14/2018   Procedure: ESOPHAGOGASTRODUODENOSCOPY (EGD) WITH PROPOFOL;  Surgeon: [name] Modena, MD;  Location: [facility] ENDOSCOPY;  Service: Endoscopy;  Laterality: Left;   ESOPHAGOGASTRODUODENOSCOPY (EGD) WITH PROPOFOL N/A 11/14/2018   Procedure: ESOPHAGOGASTRODUODENOSCOPY (EGD) WITH PROPOFOL;  Surgeon: Carman Ching, MD;  Location: [facility] ENDOSCOPY;  Service: Gastroenterology;  Laterality: N/A;   ESOPHAGOGASTRODUODENOSCOPY (EGD) WITH PROPOFOL N/A 01/02/2020   Procedure: ESOPHAGOGASTRODUODENOSCOPY (EGD) WITH PROPOFOL;  Surgeon: Rachael Fee, MD;  Location: [facility] ENDOSCOPY;  Service: Endoscopy;  Laterality: N/A;   ESOPHAGOGASTRODUODENOSCOPY (EGD) WITH PROPOFOL N/A 10/25/2020   Procedure: ESOPHAGOGASTRODUODENOSCOPY (EGD) WITH PROPOFOL;  Surgeon: Meridee Score Netty Starring., MD;  Location[facility] ENDOSCOPY;  Service: Gastroenterology;  Laterality: N/A;   EUS N/A 01/02/2020   Procedure: UPPER ENDOSCOPIC ULTRASOUND (EUS) RADIAL;  Surgeon: Rachael Fee, MD;  Location: [facility] ENDOSCOPY;  Service: Endoscopy;  Laterality: N/A;   EYE SURGERY Left 1990's   "result of trauma"    FACIAL FRACTURE SURGERY Left 1990's   "result of trauma"    FLEXIBLE SIGMOIDOSCOPY N/A 10/25/2020   Procedure: FLEXIBLE SIGMOIDOSCOPY;  Surgeon: Lemar Lofty., MD;  Location: [facility] ENDOSCOPY;  Service: Gastroenterology;  Laterality: N/A;   FRACTURE SURGERY     HEMOSTASIS CLIP PLACEMENT  10/25/2020   Procedure: HEMOSTASIS CLIP PLACEMENT;  Surgeon: Lemar Lofty., MD;  Location: [facility] ENDOSCOPY;  Service: Gastroenterology;;   HERNIA REPAIR     LEFT HEART CATHETERIZATION WITH CORONARY ANGIOGRAM Right 03/07/2013   Procedure: LEFT HEART CATHETERIZATION WITH CORONARY ANGIOGRAM;   Surgeon: Ricki Rodriguez, MD;  Location: [facility] CATH LAB;  Service: Cardiovascular;  Laterality: Right;   UMBILICAL HERNIA REPAIR     UPPER GASTROINTESTINAL ENDOSCOPY      Family History  Problem Relation Age of Onset   Hypertension Mother    Cirrhosis Father    Alcoholism Father    Hypertension Father    Melanoma Father    Hypertension Other    Coronary artery disease Other     Social:  reports that he has been smoking cigarettes and e-cigarettes. He has a 36 pack-year smoking history. He has never used smokeless tobacco. He reports current alcohol use of about 4.0 standard drinks of alcohol per week. He reports that he does not currently use drugs after having used the following drugs: Marijuana and Cocaine.  Allergies:  Allergies  Allergen Reactions   Robaxin [Methocarbamol] Other (See Comments)    jumpy limbs   Aspirin Other (See Comments)    Unknown reaction   Shellfish-Derived Products Nausea And Vomiting and Rash   Trazodone And Nefazodone Other (See Comments)    Muscle spasms   Adhesive [Tape] Itching   Latex Itching   Toradol [Ketorolac Tromethamine] Other (See Comments)    Has ulcers; cannot have this   Contrast Media [Iodinated Contrast Media] Hives   Reglan [Metoclopramide] Other (See Comments)    Muscle spasms   Salmon [Fish Oil] Nausea And Vomiting and Rash    Medications: Current Outpatient Medications  Medication Instructions   albuterol (VENTOLIN HFA) 108 (90 Base) MCG/ACT inhaler 2 puffs, Inhalation, Every 4 hours PRN   alum & mag hydroxide-simeth (MAALOX/MYLANTA) 200-200-20 MG/5ML suspension 30 mLs, Oral, Every 6 hours PRN   famotidine (PEPCID) 20 mg, Oral, 2 times daily   ferrous sulfate 325 mg, Oral, Daily   fluticasone (FLONASE) 50 MCG/ACT nasal spray 2 sprays, Each Nare, 3 times daily   folic acid (FOLVITE) 1 mg, Oral, Daily   guaiFENesin (ROBITUSSIN) 100 MG/5ML liquid 5 mLs, Oral, Every 4 hours PRN   lidocaine (XYLOCAINE) 2 % solution 15 mLs,  Mouth/Throat, Every 6 hours PRN   lipase/protease/amylase (CREON) 36000 UNITS CPEP capsule Take 2 capsules (72,000 Units total) by mouth 3 (three) times daily with meals. May also take 1 capsule (36,000 Units total) as needed (with snacks).   loperamide (IMODIUM) 4 mg, Oral, As needed   metoprolol (TOPROL-XL) 200 mg, Oral, Daily   Multiple Vitamin (MULTIVITAMIN WITH MINERALS) TABS tablet 2 tablets, Oral, Daily   nitroGLYCERIN (NITROSTAT) 0.4 mg, Sublingual, Every 5 min PRN   ondansetron (ZOFRAN-ODT) 4 mg, Oral, 2 times daily PRN   oxyCODONE (ROXICODONE) 5 mg, Oral, Every 6 hours PRN   pantoprazole (PROTONIX) 40 mg, Oral, Daily   pregabalin (LYRICA) 100 mg, Oral, 2 times daily   sucralfate (CARAFATE) 1 g, Oral, See admin instructions, Take 4 times with meals and at bedtime.    ROS - all of the below systems have been reviewed with the patient and positives are indicated with bold text General: chills, fever or night sweats Eyes: blurry vision or double vision ENT: epistaxis or sore throat Allergy/Immunology: itchy/watery eyes or nasal congestion Hematologic/Lymphatic:  bleeding problems, blood clots or swollen lymph nodes Endocrine: temperature intolerance or unexpected weight changes Breast: new or changing breast lumps or nipple discharge Resp: cough, shortness of breath, or wheezing CV: chest pain or dyspnea on exertion GI: as per HPI GU: dysuria, trouble voiding, or hematuria MSK: joint pain or joint stiffness Neuro: TIA or stroke symptoms Derm: pruritus and skin lesion changes Psych: anxiety and depression  Objective   PE Blood pressure (!) 156/101, pulse 81, temperature 98.2 F (36.8 C), temperature source Oral, resp. rate 18, height 5\' 8"  (1.727 m), weight 55.8 kg, SpO2 (!) 48%. Constitutional: NAD; conversant; no deformities Eyes: Moist conjunctiva; no lid lag; anicteric; PERRL Neck: Trachea midline; no thyromegaly Lungs: Normal respiratory effort; no tactile fremitus CV:  RRR; no palpable thrills; no pitting edema GI: Abd Soft, mild distention, mild tenderness worse over right periumbilical region; no palpable hepatosplenomegaly MSK: Normal range of motion of extremities; no clubbing/cyanosis Psychiatric: Appropriate affect; alert and oriented x3 Lymphatic: No palpable cervical or axillary lymphadenopathy  Results for orders placed or performed during the hospital encounter of 01/08/23 (from the past 24 hour(s))  Basic metabolic panel     Status: Abnormal   Collection Time: 01/08/23  6:38 PM  Result Value Ref Range   Sodium 137 135 - 145 mmol/L   Potassium 3.8 3.5 - 5.1 mmol/L   Chloride 104 98 - 111 mmol/L   CO2 20 (L) 22 - 32 mmol/L   Glucose, Bld 83 70 - 99 mg/dL   BUN 9 6 - 20 mg/dL   Creatinine, Ser 4.09 0.61 - 1.24 mg/dL   Calcium 8.3 (L) 8.9 - 10.3 mg/dL   GFR, Estimated >81 >19 mL/min   Anion gap 13 5 - 15  CBC     Status: Abnormal   Collection Time: 01/08/23  6:38 PM  Result Value Ref Range   WBC 8.4 4.0 - 10.5 K/uL   RBC 3.40 (L) 4.22 - 5.81 MIL/uL   Hemoglobin 9.5 (L) 13.0 - 17.0 g/dL   HCT 14.7 (L) 82.9 - 56.2 %   MCV 89.4 80.0 - 100.0 fL   MCH 27.9 26.0 - 34.0 pg   MCHC 31.3 30.0 - 36.0 g/dL   RDW 13.0 (H) 86.5 - 78.4 %   Platelets 163 150 - 400 K/uL   nRBC 0.0 0.0 - 0.2 %  Troponin I (High Sensitivity)     Status: None   Collection Time: 01/08/23  6:38 PM  Result Value Ref Range   Troponin I (High Sensitivity) 4 <18 ng/L  Lipase, blood     Status: None   Collection Time: 01/08/23  6:38 PM  Result Value Ref Range   Lipase 22 11 - 51 U/L  Hepatic function panel     Status: Abnormal   Collection Time: 01/08/23  6:38 PM  Result Value Ref Range   Total Protein 7.0 6.5 - 8.1 g/dL   Albumin 3.4 (L) 3.5 - 5.0 g/dL   AST 30 15 - 41 U/L   ALT 28 0 - 44 U/L   Alkaline Phosphatase 84 38 - 126 U/L   Total Bilirubin 0.6 0.3 - 1.2 mg/dL   Bilirubin, Direct <6.9 0.0 - 0.2 mg/dL   Indirect Bilirubin NOT CALCULATED 0.3 - 0.9 mg/dL   Urinalysis, Routine w reflex microscopic -Urine, Clean Catch     Status: None   Collection Time: 01/08/23  6:43 PM  Result Value Ref Range   Color, Urine YELLOW YELLOW   APPearance CLEAR CLEAR  Specific Gravity, Urine 1.014 1.005 - 1.030   pH 5.0 5.0 - 8.0   Glucose, UA NEGATIVE NEGATIVE mg/dL   Hgb urine dipstick NEGATIVE NEGATIVE   Bilirubin Urine NEGATIVE NEGATIVE   Ketones, ur NEGATIVE NEGATIVE mg/dL   Protein, ur NEGATIVE NEGATIVE mg/dL   Nitrite NEGATIVE NEGATIVE   Leukocytes,Ua NEGATIVE NEGATIVE  Troponin I (High Sensitivity)     Status: None   Collection Time: 01/08/23 11:08 PM  Result Value Ref Range   Troponin I (High Sensitivity) 4 <18 ng/L     Imaging Orders         DG Chest 2 View         CT ABDOMEN PELVIS WO CONTRAST    CT Abd/Pel 01/09/23   1. Sequela of chronic pancreatitis with multiple pseudocysts in the pancreatic tail, increased in size from the prior exam. No acute inflammatory changes are seen. 2. Mildly distended fluid-filled loops of small bowel in the pelvis measuring up to 3.3 cm. No transition point is identified. Findings may represent ileus versus partial or early small bowel obstruction. 3. Emphysema. 4. Aortic atherosclerosis.   Assessment and Plan   [name] is an 52 y.o. male with a history of hernia repair and chronic pancreatitis related to alcohol use who presents with abdominal pain and has dilated small bowel on CT.  I recommend the small bowel obstruction protocol to help determine if this is a small bowel obstruction or ileus.  Surgery team will follow up results.    ICD-10-CM   1. Epigastric pain  R10.13        Quentin Ore, MD  [facility] Use [email or web address] to contact on call provider  New Patient Billing: [id number] - Moderate MDM

## 2023-01-09 NOTE — ED Notes (Signed)
Attempted IV x2 and was unsuccessful. IV team consult order placed.

## 2023-01-09 NOTE — Progress Notes (Signed)
Charge RN notified that patient's room smelled of cigarettes and brown flakes noted to blanket. Charge RN went into patients room. Patient educated on tobacco free campus and extreme dangers of smoking in the room. Patient handed nurse 1 cigarette and removed his lighter from his sock. Cigarette and lighter removed from room and placed in patient's chart.

## 2023-01-09 NOTE — ED Notes (Signed)
ED TO INPATIENT HANDOFF REPORT  ED Nurse Name and Phone #: [phone]  S Name/Age/Gender [name] 52 y.o. male Room/Bed: 025C/025C  Code Status   Code Status: Full Code  Home/SNF/Other Home Patient oriented to: self, place, time, and situation Is this baseline? Yes   Triage Complete: Triage complete  Chief Complaint Epigastric abdominal pain [R10.13]  Triage Note Pt c/o left sided chest pain that radiates to left shoulder started today. Pt also c/o epigastric pain, N/V/D started today. Pt didn't take the prescribed nitroglycerin at home, because he took viagra at 1300 today. EMS gave ASA 324 mg.   Allergies Allergies  Allergen Reactions   Robaxin [Methocarbamol] Other (See Comments)    jumpy limbs   Aspirin Other (See Comments)    Unknown reaction   Shellfish-Derived Products Nausea And Vomiting and Rash   Trazodone And Nefazodone Other (See Comments)    Muscle spasms   Adhesive [Tape] Itching   Latex Itching   Toradol [Ketorolac Tromethamine] Other (See Comments)    Has ulcers; cannot have this   Contrast Media [Iodinated Contrast Media] Hives   Reglan [Metoclopramide] Other (See Comments)    Muscle spasms   Salmon [Fish Oil] Nausea And Vomiting and Rash    Level of Care/Admitting Diagnosis ED Disposition     ED Disposition  Admit   Condition  --   Comment  Hospital Area: [facility] [[id number]]  Level of Care: Telemetry Medical [104]  May place patient in observation at [facility] or [facility] if equivalent level of care is available:: Yes  Covid Evaluation: Asymptomatic - no recent exposure (last 10 days) testing not required  Diagnosis: Epigastric abdominal pain [214841]  Admitting Physician: John Giovanni [[id number]]  Attending Physician: John Giovanni [[id number]]          B Medical/Surgery History Past Medical History:  Diagnosis Date   Alcoholism /alcohol abuse    Anemia    Anxiety    Arthritis    knees;  arms; elbows (03/26/2015)   Asthma    Bipolar disorder (HCC)    Chronic bronchitis (HCC)    Chronic lower back pain    Chronic pancreatitis (HCC)    Cocaine abuse (HCC)    Depression    Family history of adverse reaction to anesthesia    Femoral condyle fracture (HCC) 03/08/2014   left medial/notes 03/09/2014   GERD (gastroesophageal reflux disease)    H/O hiatal hernia    H/O suicide attempt 10/2012   High cholesterol    History of blood transfusion 10/2012   when I tried to commit suicide   History of stomach ulcers    Hypertension    Marijuana abuse, continuous    Migraine    a few times/year (03/26/2015)   Pneumonia 1990's X 3   PTSD (post-traumatic stress disorder)    Seizures (HCC)    Sickle cell trait (HCC)    WPW (Wolff-Parkinson-White syndrome)    [name] 03/06/2013   Past Surgical History:  Procedure Laterality Date   BIOPSY  11/25/2017   Procedure: BIOPSY;  Surgeon: [name] Modena, MD;  Location: [facility] ENDOSCOPY;  Service: Endoscopy;;   BIOPSY  10/14/2018   Procedure: BIOPSY;  Surgeon: [name] Modena, MD;  Location: [facility] ENDOSCOPY;  Service: Endoscopy;;   CARDIAC CATHETERIZATION     CYST ENTEROSTOMY  01/02/2020   Procedure: CYST ASPIRATION;  Surgeon: Rachael Fee, MD;  Location: [facility] ENDOSCOPY;  Service: Endoscopy;;   ESOPHAGOGASTRODUODENOSCOPY (EGD) WITH PROPOFOL N/A 11/25/2017  Procedure: ESOPHAGOGASTRODUODENOSCOPY (EGD) WITH PROPOFOL;  Surgeon: [name] Modena, MD;  Location: [facility] ENDOSCOPY;  Service: Endoscopy;  Laterality: N/A;   ESOPHAGOGASTRODUODENOSCOPY (EGD) WITH PROPOFOL Left 10/14/2018   Procedure: ESOPHAGOGASTRODUODENOSCOPY (EGD) WITH PROPOFOL;  Surgeon: [name] Modena, MD;  Location: [facility] ENDOSCOPY;  Service: Endoscopy;  Laterality: Left;   ESOPHAGOGASTRODUODENOSCOPY (EGD) WITH PROPOFOL N/A 11/14/2018   Procedure: ESOPHAGOGASTRODUODENOSCOPY (EGD) WITH PROPOFOL;  Surgeon: Carman Ching, MD;  Location: [facility] ENDOSCOPY;  Service: Gastroenterology;  Laterality: N/A;    ESOPHAGOGASTRODUODENOSCOPY (EGD) WITH PROPOFOL N/A 01/02/2020   Procedure: ESOPHAGOGASTRODUODENOSCOPY (EGD) WITH PROPOFOL;  Surgeon: Rachael Fee, MD;  Location: [facility] ENDOSCOPY;  Service: Endoscopy;  Laterality: N/A;   ESOPHAGOGASTRODUODENOSCOPY (EGD) WITH PROPOFOL N/A 10/25/2020   Procedure: ESOPHAGOGASTRODUODENOSCOPY (EGD) WITH PROPOFOL;  Surgeon: Meridee Score Netty Starring., MD;  Location: [facility] ENDOSCOPY;  Service: Gastroenterology;  Laterality: N/A;   EUS N/A 01/02/2020   Procedure: UPPER ENDOSCOPIC ULTRASOUND (EUS) RADIAL;  Surgeon: Rachael Fee, MD;  Location: [facility] ENDOSCOPY;  Service: Endoscopy;  Laterality: N/A;   EYE SURGERY Left 1990's   "result of trauma"    FACIAL FRACTURE SURGERY Left 1990's   "result of trauma"    FLEXIBLE SIGMOIDOSCOPY N/A 10/25/2020   Procedure: FLEXIBLE SIGMOIDOSCOPY;  Surgeon: Lemar Lofty., MD;  Location: [facility] ENDOSCOPY;  Service: Gastroenterology;  Laterality: N/A;   FRACTURE SURGERY     HEMOSTASIS CLIP PLACEMENT  10/25/2020   Procedure: HEMOSTASIS CLIP PLACEMENT;  Surgeon: Lemar Lofty., MD;  Location: [facility] ENDOSCOPY;  Service: Gastroenterology;;   HERNIA REPAIR     LEFT HEART CATHETERIZATION WITH CORONARY ANGIOGRAM Right 03/07/2013   Procedure: LEFT HEART CATHETERIZATION WITH CORONARY ANGIOGRAM;  Surgeon: Ricki Rodriguez, MD;  Location: [facility] CATH LAB;  Service: Cardiovascular;  Laterality: Right;   UMBILICAL HERNIA REPAIR     UPPER GASTROINTESTINAL ENDOSCOPY       A IV Location/Drains/Wounds Patient Lines/Drains/Airways Status     Active Line/Drains/Airways     Name Placement date Placement time Site Days   Peripheral IV 01/09/23 22 G 1" Left;Medial Forearm 01/09/23  0112  Forearm  less than 1            Intake/Output Last 24 hours No intake or output data in the 24 hours ending 01/09/23 0400  Labs/Imaging Results for orders placed or performed during the hospital encounter of 01/08/23 (from the past 48 hour(s))  Basic metabolic  panel     Status: Abnormal   Collection Time: 01/08/23  6:38 PM  Result Value Ref Range   Sodium 137 135 - 145 mmol/L   Potassium 3.8 3.5 - 5.1 mmol/L   Chloride 104 98 - 111 mmol/L   CO2 20 (L) 22 - 32 mmol/L   Glucose, Bld 83 70 - 99 mg/dL    Comment: Glucose reference range applies only to samples taken after fasting for at least 8 hours.   BUN 9 6 - 20 mg/dL   Creatinine, Ser 1.61 0.61 - 1.24 mg/dL   Calcium 8.3 (L) 8.9 - 10.3 mg/dL   GFR, Estimated >09 >60 mL/min    Comment: (NOTE) Calculated using the CKD-EPI Creatinine Equation (2021)    Anion gap 13 5 - 15    Comment: Performed at [facility], [address] [place], [place] [address]  CBC     Status: Abnormal   Collection Time: 01/08/23  6:38 PM  Result Value Ref Range   WBC 8.4 4.0 - 10.5 K/uL   RBC 3.40 (L) 4.22 - 5.81 MIL/uL  Hemoglobin 9.5 (L) 13.0 - 17.0 g/dL   HCT 16.1 (L) 09.6 - 04.5 %   MCV 89.4 80.0 - 100.0 fL   MCH 27.9 26.0 - 34.0 pg   MCHC 31.3 30.0 - 36.0 g/dL   RDW 40.9 (H) 81.1 - 91.4 %   Platelets 163 150 - 400 K/uL   nRBC 0.0 0.0 - 0.2 %    Comment: Performed at [facility], [address] [place], [place] [address]  Troponin I (High Sensitivity)     Status: None   Collection Time: 01/08/23  6:38 PM  Result Value Ref Range   Troponin I (High Sensitivity) 4 <18 ng/L    Comment: (NOTE) Elevated high sensitivity troponin I (hsTnI) values and significant  changes across serial measurements may suggest ACS but many other  chronic and acute conditions are known to elevate hsTnI results.  Refer to the "Links" section for chest pain algorithms and additional  guidance. Performed at [facility], [address] [place], [place] [address]   Lipase, blood     Status: None   Collection Time: 01/08/23  6:38 PM  Result Value Ref Range   Lipase 22 11 - 51 U/L    Comment: Performed at [facility], [address], [place], [place] [address]  Hepatic function panel     Status:  Abnormal   Collection Time: 01/08/23  6:38 PM  Result Value Ref Range   Total Protein 7.0 6.5 - 8.1 g/dL   Albumin 3.4 (L) 3.5 - 5.0 g/dL   AST 30 15 - 41 U/L   ALT 28 0 - 44 U/L   Alkaline Phosphatase 84 38 - 126 U/L   Total Bilirubin 0.6 0.3 - 1.2 mg/dL   Bilirubin, Direct <4.6 0.0 - 0.2 mg/dL   Indirect Bilirubin NOT CALCULATED 0.3 - 0.9 mg/dL    Comment: Performed at [facility], [address], [place], [place] [address]  Urinalysis, Routine w reflex microscopic -Urine, Clean Catch     Status: None   Collection Time: 01/08/23  6:43 PM  Result Value Ref Range   Color, Urine YELLOW YELLOW   APPearance CLEAR CLEAR   Specific Gravity, Urine 1.014 1.005 - 1.030   pH 5.0 5.0 - 8.0   Glucose, UA NEGATIVE NEGATIVE mg/dL   Hgb urine dipstick NEGATIVE NEGATIVE   Bilirubin Urine NEGATIVE NEGATIVE   Ketones, ur NEGATIVE NEGATIVE mg/dL   Protein, ur NEGATIVE NEGATIVE mg/dL   Nitrite NEGATIVE NEGATIVE   Leukocytes,Ua NEGATIVE NEGATIVE    Comment: Performed at [facility], [address], [place], [place] [address]  Troponin I (High Sensitivity)     Status: None   Collection Time: 01/08/23 11:08 PM  Result Value Ref Range   Troponin I (High Sensitivity) 4 <18 ng/L    Comment: (NOTE) Elevated high sensitivity troponin I (hsTnI) values and significant  changes across serial measurements may suggest ACS but many other  chronic and acute conditions are known to elevate hsTnI results.  Refer to the "Links" section for chest pain algorithms and additional  guidance. Performed at [facility], [address] [place], [place] [address]    CT ABDOMEN PELVIS WO CONTRAST  Result Date: 01/09/2023 CLINICAL DATA:  Pancreatitis, acute, severe. EXAM: CT ABDOMEN AND PELVIS WITHOUT CONTRAST TECHNIQUE: Multidetector CT imaging of the abdomen and pelvis was performed following the standard protocol without IV contrast. RADIATION DOSE REDUCTION: This exam was performed according  to  the departmental dose-optimization program which includes automated exposure control, adjustment of the mA and/or kV according to patient size and/or use of iterative reconstruction technique. COMPARISON:  11/28/2022. FINDINGS: Lower chest: Emphysematous changes are noted in the lungs. Mild atelectasis or scarring is noted at the lung bases. There is a trace pericardial effusion. Hepatobiliary: No focal liver abnormality is seen. No gallstones, gallbladder wall thickening, or biliary dilatation. Pancreas: Multiple calcifications are noted in the pancreas, compatible with sequela of chronic pancreatitis. No biliary ductal dilatation. Cysts are present in the pancreatic tail, the largest measuring 3.9 cm. There is a 1.4 cm cyst seen distal to this. A cyst is present at the tip of the tail measuring 1.4 cm. No acute inflammatory changes are seen. Spleen: Normal in size without focal abnormality. Adrenals/Urinary Tract: The adrenal glands are within normal limits. No renal calculus or hydronephrosis bilaterally. The bladder is unremarkable. Stomach/Bowel: Stomach is within normal limits. Appendix appears normal. There are mildly distended fluid-filled loops of small bowel in the pelvis measuring up to 3.3 cm. No transition point is seen. No focal bowel wall thickening or surrounding inflammatory changes. No free air or pneumatosis. Vascular/Lymphatic: Aortic atherosclerosis. No enlarged abdominal or pelvic lymph nodes. Reproductive: Prostate is unremarkable. Other: No abdominopelvic ascites. Musculoskeletal: No acute osseous abnormality. IMPRESSION: 1. Sequela of chronic pancreatitis with multiple pseudocysts in the pancreatic tail, increased in size from the prior exam. No acute inflammatory changes are seen. 2. Mildly distended fluid-filled loops of small bowel in the pelvis measuring up to 3.3 cm. No transition point is identified. Findings may represent ileus versus partial or early small bowel obstruction. 3.  Emphysema. 4. Aortic atherosclerosis. Electronically Signed   By: Thornell Sartorius M.D.   On: 01/09/2023 01:01   DG Chest 2 View  Result Date: 01/08/2023 CLINICAL DATA:  Chest pain on left EXAM: CHEST - 2 VIEW COMPARISON:  01/01/2023 FINDINGS: Normal cardiomediastinal silhouette. No focal consolidation, pleural effusion, or pneumothorax. No displaced rib fractures. IMPRESSION: No acute cardiopulmonary disease. Electronically Signed   By: Minerva Fester M.D.   On: 01/08/2023 19:28    Pending Labs Unresulted Labs (From admission, onward)     Start     Ordered   01/09/23 0500  Basic metabolic panel  Tomorrow morning,   R        01/09/23 0332   01/09/23 0500  CBC  Tomorrow morning,   R        01/09/23 0332            Vitals/Pain Today's Vitals   01/08/23 1826 01/08/23 2215 01/09/23 0137 01/09/23 0138  BP:  (!) 151/93 (!) 154/100 (!) 154/100  Pulse:  63 68 72  Resp:  17 17 19   Temp:  97.8 F (36.6 C) 98.4 F (36.9 C)   TempSrc:  Oral Oral   SpO2:  100% 100% 100%  Weight: 55.8 kg     Height: 5\' 8"  (1.727 m)     PainSc: 9        Isolation Precautions No active isolations  Medications Medications  metoprolol succinate (TOPROL-XL) 24 hr tablet 200 mg (has no administration in time range)  lipase/protease/amylase (CREON) capsule 72,000 Units (has no administration in time range)    And  lipase/protease/amylase (CREON) capsule 36,000 Units (has no administration in time range)  pantoprazole (PROTONIX) EC tablet 40 mg (has no administration in time range)  sucralfate (CARAFATE) 1 GM/10ML suspension 1 g (has no administration in time range)  pregabalin (  LYRICA) capsule 100 mg (100 mg Oral Given 01/09/23 0354)  albuterol (PROVENTIL) (2.5 MG/3ML) 0.083% nebulizer solution 2.5 mg (has no administration in time range)  acetaminophen (TYLENOL) tablet 650 mg (has no administration in time range)    Or  acetaminophen (TYLENOL) suppository 650 mg (has no administration in time range)   LORazepam (ATIVAN) tablet 1-4 mg (has no administration in time range)    Or  LORazepam (ATIVAN) injection 1-4 mg (has no administration in time range)  thiamine (VITAMIN B1) tablet 100 mg (has no administration in time range)    Or  thiamine (VITAMIN B1) injection 100 mg (has no administration in time range)  folic acid (FOLVITE) tablet 1 mg (has no administration in time range)  multivitamin with minerals tablet 1 tablet (has no administration in time range)  0.9 %  sodium chloride infusion (has no administration in time range)  HYDROmorphone (DILAUDID) injection 1 mg (has no administration in time range)  naloxone (NARCAN) injection 0.4 mg (has no administration in time range)  ondansetron (ZOFRAN) injection 4 mg (has no administration in time range)  lactated ringers bolus 1,000 mL (0 mLs Intravenous Stopped 01/09/23 0317)  HYDROmorphone (DILAUDID) injection 1 mg (1 mg Intravenous Given 01/09/23 0136)  ondansetron (ZOFRAN) injection 4 mg (4 mg Intravenous Given 01/09/23 0126)    Mobility walks     Focused Assessments N/a   R Recommendations: See Admitting Provider Note  Report given to:   Additional Notes:

## 2023-01-09 NOTE — H&P (Signed)
History and Physical    [name] ZOX:[id number] DOB: April 07, 1971 DOA: 01/08/2023  PCP: Pcp, No  Patient coming from: Home  Chief Complaint: Abdominal pain  HPI: [name] is a 52 y.o. male with medical history significant of polysubstance abuse (alcohol, cocaine, marijuana, tobacco), paroxysmal A-fib not on anticoagulation due to substance abuse and nonadherence, WPW syndrome status post ablation, chronic pancreatitis, pancreatic pseudocyst, hypertension, depression/anxiety, bipolar disorder, asthma/chronic bronchitis, history of seizures, chronic back pain, GERD/PUD, hypertension, hyperlipidemia.  Recently admitted 8/25-8/29 for acute on chronic pancreatitis with ongoing alcohol use.  Patient presents to the ED complaining of epigastric abdominal pain, nausea, vomiting, and diarrhea.  Blood pressure elevated with systolic in the 150s, remainder of vital signs stable.  Labs showing no leukocytosis, hemoglobin 9.5 (stable), bicarb 20, glucose 83, creatinine 0.8, troponin negative x 2, lipase and LFTs normal, UA not suggestive of infection.  Chest x-ray showing no acute cardiopulmonary disease.  CT abdomen pelvis showing sequela of chronic pancreatitis with multiple pseudocysts in the pancreatic tail, increase in size from the prior exam.  No acute inflammatory changes are seen.  Also showing mildly distended fluid-filled loops of small bowel in the pelvis measuring up to 3.3 cm but no transition point identified.  Findings may represent ileus versus partial or early SBO. Patient was given Dilaudid, Zofran, and 1 L LR.  ED PA discussed the case with general surgery (Dr. Royanne Foots), did not recommend placing NG tube at this time.  General surgery will consult in the morning.  TR[facility] called to admit.  Patient states he has continued to have epigastric abdominal pain and diarrhea since after he left the hospital.  Yesterday he ate some scrambled eggs and potatoes after which he  started having nausea and vomiting.  He vomited twice at home but no further episodes in the ED.  Continues to have epigastric abdominal pain.  States he saw [facility] a month ago and is scheduled for an EGD on October 24th.  His diarrhea has improved and when he had his last bowel movement at 11:30 PM tonight, the stool appeared more well-formed.  No other complaints.  Review of Systems:  Review of Systems  All other systems reviewed and are negative.   Past Medical History:  Diagnosis Date   Alcoholism /alcohol abuse    Anemia    Anxiety    Arthritis    knees; arms; elbows (03/26/2015)   Asthma    Bipolar disorder (HCC)    Chronic bronchitis (HCC)    Chronic lower back pain    Chronic pancreatitis (HCC)    Cocaine abuse (HCC)    Depression    Family history of adverse reaction to anesthesia    Femoral condyle fracture (HCC) 03/08/2014   left medial/notes 03/09/2014   GERD (gastroesophageal reflux disease)    H/O hiatal hernia    H/O suicide attempt 10/2012   High cholesterol    History of blood transfusion 10/2012   when I tried to commit suicide   History of stomach ulcers    Hypertension    Marijuana abuse, continuous    Migraine    a few times/year (03/26/2015)   Pneumonia 1990's X 3   PTSD (post-traumatic stress disorder)    Seizures (HCC)    Sickle cell trait (HCC)    WPW (Wolff-Parkinson-White syndrome)    [name] 03/06/2013    Past Surgical History:  Procedure Laterality Date   BIOPSY  11/25/2017   Procedure: BIOPSY;  Surgeon: [name] Modena,  MD;  Location: [facility] ENDOSCOPY;  Service: Endoscopy;;   BIOPSY  10/14/2018   Procedure: BIOPSY;  Surgeon: [name] Modena, MD;  Location: [facility] ENDOSCOPY;  Service: Endoscopy;;   CARDIAC CATHETERIZATION     CYST ENTEROSTOMY  01/02/2020   Procedure: CYST ASPIRATION;  Surgeon: Rachael Fee, MD;  Location: [facility] ENDOSCOPY;  Service: Endoscopy;;   ESOPHAGOGASTRODUODENOSCOPY (EGD) WITH PROPOFOL N/A 11/25/2017   Procedure:  ESOPHAGOGASTRODUODENOSCOPY (EGD) WITH PROPOFOL;  Surgeon: [name] Modena, MD;  Location: [facility] ENDOSCOPY;  Service: Endoscopy;  Laterality: N/A;   ESOPHAGOGASTRODUODENOSCOPY (EGD) WITH PROPOFOL Left 10/14/2018   Procedure: ESOPHAGOGASTRODUODENOSCOPY (EGD) WITH PROPOFOL;  Surgeon: [name] Modena, MD;  Location: [facility] ENDOSCOPY;  Service: Endoscopy;  Laterality: Left;   ESOPHAGOGASTRODUODENOSCOPY (EGD) WITH PROPOFOL N/A 11/14/2018   Procedure: ESOPHAGOGASTRODUODENOSCOPY (EGD) WITH PROPOFOL;  Surgeon: Carman Ching, MD;  Location: [facility] ENDOSCOPY;  Service: Gastroenterology;  Laterality: N/A;   ESOPHAGOGASTRODUODENOSCOPY (EGD) WITH PROPOFOL N/A 01/02/2020   Procedure: ESOPHAGOGASTRODUODENOSCOPY (EGD) WITH PROPOFOL;  Surgeon: Rachael Fee, MD;  Location: [facility] ENDOSCOPY;  Service: Endoscopy;  Laterality: N/A;   ESOPHAGOGASTRODUODENOSCOPY (EGD) WITH PROPOFOL N/A 10/25/2020   Procedure: ESOPHAGOGASTRODUODENOSCOPY (EGD) WITH PROPOFOL;  Surgeon: Meridee Score Netty Starring., MD;  Location: [facility] ENDOSCOPY;  Service: Gastroenterology;  Laterality: N/A;   EUS N/A 01/02/2020   Procedure: UPPER ENDOSCOPIC ULTRASOUND (EUS) RADIAL;  Surgeon: Rachael Fee, MD;  Location: [facility] ENDOSCOPY;  Service: Endoscopy;  Laterality: N/A;   EYE SURGERY Left 1990's   "result of trauma"    FACIAL FRACTURE SURGERY Left 1990's   "result of trauma"    FLEXIBLE SIGMOIDOSCOPY N/A 10/25/2020   Procedure: FLEXIBLE SIGMOIDOSCOPY;  Surgeon: Lemar Lofty., MD;  Location: [facility] ENDOSCOPY;  Service: Gastroenterology;  Laterality: N/A;   FRACTURE SURGERY     HEMOSTASIS CLIP PLACEMENT  10/25/2020   Procedure: HEMOSTASIS CLIP PLACEMENT;  Surgeon: Lemar Lofty., MD;  Location: [facility] ENDOSCOPY;  Service: Gastroenterology;;   HERNIA REPAIR     LEFT HEART CATHETERIZATION WITH CORONARY ANGIOGRAM Right 03/07/2013   Procedure: LEFT HEART CATHETERIZATION WITH CORONARY ANGIOGRAM;  Surgeon: Ricki Rodriguez, MD;  Location: [facility] CATH LAB;  Service:  Cardiovascular;  Laterality: Right;   UMBILICAL HERNIA REPAIR     UPPER GASTROINTESTINAL ENDOSCOPY       reports that he has been smoking cigarettes and e-cigarettes. He has a 36 pack-year smoking history. He has never used smokeless tobacco. He reports current alcohol use of about 4.0 standard drinks of alcohol per week. He reports that he does not currently use drugs after having used the following drugs: Marijuana and Cocaine.  Allergies  Allergen Reactions   Robaxin [Methocarbamol] Other (See Comments)    jumpy limbs   Aspirin Other (See Comments)    Unknown reaction   Shellfish-Derived Products Nausea And Vomiting and Rash   Trazodone And Nefazodone Other (See Comments)    Muscle spasms   Adhesive [Tape] Itching   Latex Itching   Toradol [Ketorolac Tromethamine] Other (See Comments)    Has ulcers; cannot have this   Contrast Media [Iodinated Contrast Media] Hives   Reglan [Metoclopramide] Other (See Comments)    Muscle spasms   Salmon [Fish Oil] Nausea And Vomiting and Rash    Family History  Problem Relation Age of Onset   Hypertension Mother    Cirrhosis Father    Alcoholism Father    Hypertension Father    Melanoma Father    Hypertension Other    Coronary artery disease Other     Prior to Admission medications  Medication Sig Start Date End Date Taking? Authorizing Provider  albuterol (VENTOLIN HFA) 108 (90 Base) MCG/ACT inhaler Inhale 2 puffs into the lungs every 4 (four) hours as needed for wheezing or shortness of breath. 07/23/22  Yes Ghimire, Werner Lean, MD  famotidine (PEPCID) 20 MG tablet Take 1 tablet (20 mg total) by mouth 2 (two) times daily. Patient taking differently: Take 20 mg by mouth daily as needed for heartburn. 12/17/22  Yes Fayrene Helper, PA-C  ferrous sulfate 325 (65 FE) MG tablet Take 325 mg by mouth daily. 05/11/21  Yes [provider]  fluticasone (FLONASE) 50 MCG/ACT nasal spray Place 2 sprays into both nostrils in the morning, at noon,  and at bedtime.   Yes [provider]  folic acid (FOLVITE) 1 MG tablet Take 1 tablet (1 mg total) by mouth daily. 09/21/22  Yes Pokhrel, Laxman, MD  guaiFENesin (ROBITUSSIN) 100 MG/5ML liquid Take 5 mLs by mouth every 4 (four) hours as needed for cough or to loosen phlegm. 01/05/23  Yes Dorcas Carrow, MD  lidocaine (XYLOCAINE) 2 % solution Use as directed 15 mLs in the mouth or throat every 6 (six) hours as needed for mouth pain. 01/05/23  Yes Dorcas Carrow, MD  lipase/protease/amylase (CREON) 36000 UNITS CPEP capsule Take 2 capsules (72,000 Units total) by mouth 3 (three) times daily with meals. May also take 1 capsule (36,000 Units total) as needed (with snacks). 09/21/22  Yes Pokhrel, Laxman, MD  loperamide (IMODIUM) 2 MG capsule Take 2 capsules (4 mg total) by mouth as needed for diarrhea or loose stools. 11/30/22  Yes Quentin Mulling R, PA-C  metoprolol (TOPROL-XL) 200 MG 24 hr tablet Take 200 mg by mouth daily. 11/30/22  Yes [provider]  Multiple Vitamin (MULTIVITAMIN WITH MINERALS) TABS tablet Take 2 tablets by mouth daily. 01/05/23 02/04/23 Yes Ghimire, Lyndel Safe, MD  nitroGLYCERIN (NITROSTAT) 0.4 MG SL tablet Place 0.4 mg under the tongue every 5 (five) minutes as needed for chest pain.   Yes [provider]  ondansetron (ZOFRAN-ODT) 4 MG disintegrating tablet Take 1 tablet (4 mg total) by mouth 2 (two) times daily as needed for nausea or vomiting. 09/21/22  Yes Pokhrel, Laxman, MD  oxyCODONE (ROXICODONE) 5 MG immediate release tablet Take 1 tablet (5 mg total) by mouth every 6 (six) hours as needed for up to 5 days for severe pain or moderate pain. 01/05/23 01/10/23 Yes Ghimire, Lyndel Safe, MD  pantoprazole (PROTONIX) 40 MG tablet Take 1 tablet (40 mg total) by mouth daily. 11/30/22  Yes Quentin Mulling R, PA-C  pregabalin (LYRICA) 100 MG capsule Take 1 capsule (100 mg total) by mouth 2 (two) times daily. 11/30/22  Yes Quentin Mulling R, PA-C  sucralfate (CARAFATE) 1 GM/10ML  suspension Take 10 mLs (1 g total) by mouth See admin instructions. Take 4 times with meals and at bedtime. Patient taking differently: Take 1 g by mouth in the morning, at noon, and at bedtime. 01/05/23  Yes Dorcas Carrow, MD  alum & mag hydroxide-simeth (MAALOX/MYLANTA) 200-200-20 MG/5ML suspension Take 30 mLs by mouth every 6 (six) hours as needed for indigestion or heartburn. Patient not taking: Reported on 01/09/2023 01/05/23   Dorcas Carrow, MD  amitriptyline (ELAVIL) 25 MG tablet Take 1 tablet (25 mg total) by mouth at bedtime. Patient not taking: Reported on 08/08/2019 10/15/18 08/08/19  Rolly Salter, MD    Physical Exam: Vitals:   01/08/23 1826 01/08/23 2215 01/09/23 0137 01/09/23 0138  BP:  (!) 151/93 (!) 154/100 (!) 154/100  Pulse:  63 68 72  Resp:  17 17 19   Temp:  97.8 F (36.6 C) 98.4 F (36.9 C)   TempSrc:  Oral Oral   SpO2:  100% 100% 100%  Weight: 55.8 kg     Height: 5\' 8"  (1.727 m)       Physical Exam Vitals reviewed.  Constitutional:      General: He is not in acute distress. HENT:     Head: Normocephalic and atraumatic.  Eyes:     Extraocular Movements: Extraocular movements intact.  Cardiovascular:     Rate and Rhythm: Normal rate and regular rhythm.     Pulses: Normal pulses.  Pulmonary:     Effort: Pulmonary effort is normal. No respiratory distress.     Breath sounds: Normal breath sounds. No wheezing or rales.  Abdominal:     General: Bowel sounds are normal. There is no distension.     Palpations: Abdomen is soft.     Tenderness: There is abdominal tenderness. There is no guarding or rebound.     Comments: Epigastrium tender to palpation  Musculoskeletal:     Cervical back: Normal range of motion.     Right lower leg: No edema.     Left lower leg: No edema.  Skin:    General: Skin is warm and dry.  Neurological:     General: No focal deficit present.     Mental Status: He is alert and oriented to person, place, and time.     Labs on  Admission: I have personally reviewed following labs and imaging studies  CBC: Recent Labs  Lab 01/02/23 0556 01/03/23 1110 01/04/23 0548 01/08/23 1838  WBC 11.1* 7.6 9.9 8.4  HGB 9.0* 8.9* 9.2* 9.5*  HCT 28.8* 29.9* 29.8* 30.4*  MCV 91.7 94.3 93.1 89.4  PLT 147* 148* 137* 163   Basic Metabolic Panel: Recent Labs  Lab 01/02/23 0556 01/03/23 1110 01/04/23 0548 01/05/23 0158 01/08/23 1838  NA 135 139 138 134* 137  K 3.6 4.3 5.1 4.6 3.8  CL 109 112* 109 104 104  CO2 16* 19* 23 21* 20*  GLUCOSE 78 65* 86 93 83  BUN 9 <5* <5* 8 9  CREATININE 0.98 0.66 1.09 1.02 0.88  CALCIUM 7.9* 8.1* 8.7* 8.6* 8.3*  MG 1.8 1.2* 1.8  --   --   PHOS  --  2.6  --   --   --    GFR: Estimated Creatinine Clearance: 76.6 mL/min (by C-G formula based on SCr of 0.88 mg/dL). Liver Function Tests: Recent Labs  Lab 01/03/23 1110 01/04/23 0548 01/05/23 0158 01/08/23 1838  AST 33 143* 56* 30  ALT 32 64* 44 28  ALKPHOS 64 104 89 84  BILITOT 0.6 0.9 0.4 0.6  PROT 6.2* 6.4* 5.4* 7.0  ALBUMIN 3.2* 3.3* 2.8* 3.4*   Recent Labs  Lab 01/08/23 1838  LIPASE 22   No results for input(s): "AMMONIA" in the last 168 hours. Coagulation Profile: No results for input(s): "INR", "PROTIME" in the last 168 hours. Cardiac Enzymes: No results for input(s): "CKTOTAL", "CKMB", "CKMBINDEX", "TROPONINI" in the last 168 hours. BNP (last 3 results) No results for input(s): "PROBNP" in the last 8760 hours. HbA1C: No results for input(s): "HGBA1C" in the last 72 hours. CBG: Recent Labs  Lab 01/03/23 2049 01/04/23 0759 01/04/23 1234 01/04/23 1627 01/05/23 0743  GLUCAP 76 122* 100* 83 72   Lipid Profile: No results for input(s): "CHOL", "HDL", "LDLCALC", "TRIG", "CHOLHDL", "LDLDIRECT" in the last  72 hours. Thyroid Function Tests: No results for input(s): "TSH", "T4TOTAL", "FREET4", "T3FREE", "THYROIDAB" in the last 72 hours. Anemia Panel: No results for input(s): "VITAMINB12", "FOLATE", "FERRITIN",  "TIBC", "IRON", "RETICCTPCT" in the last 72 hours. Urine analysis:    Component Value Date/Time   COLORURINE YELLOW 01/08/2023 1843   APPEARANCEUR CLEAR 01/08/2023 1843   LABSPEC 1.014 01/08/2023 1843   PHURINE 5.0 01/08/2023 1843   GLUCOSEU NEGATIVE 01/08/2023 1843   HGBUR NEGATIVE 01/08/2023 1843   HGBUR negative 04/30/2010 1020   BILIRUBINUR NEGATIVE 01/08/2023 1843   BILIRUBINUR negative 09/06/2019 1649   KETONESUR NEGATIVE 01/08/2023 1843   PROTEINUR NEGATIVE 01/08/2023 1843   UROBILINOGEN 0.2 09/06/2019 1649   UROBILINOGEN 0.2 03/15/2015 0508   NITRITE NEGATIVE 01/08/2023 1843   LEUKOCYTESUR NEGATIVE 01/08/2023 1843    Radiological Exams on Admission: CT ABDOMEN PELVIS WO CONTRAST  Result Date: 01/09/2023 CLINICAL DATA:  Pancreatitis, acute, severe. EXAM: CT ABDOMEN AND PELVIS WITHOUT CONTRAST TECHNIQUE: Multidetector CT imaging of the abdomen and pelvis was performed following the standard protocol without IV contrast. RADIATION DOSE REDUCTION: This exam was performed according to the departmental dose-optimization program which includes automated exposure control, adjustment of the mA and/or kV according to patient size and/or use of iterative reconstruction technique. COMPARISON:  11/28/2022. FINDINGS: Lower chest: Emphysematous changes are noted in the lungs. Mild atelectasis or scarring is noted at the lung bases. There is a trace pericardial effusion. Hepatobiliary: No focal liver abnormality is seen. No gallstones, gallbladder wall thickening, or biliary dilatation. Pancreas: Multiple calcifications are noted in the pancreas, compatible with sequela of chronic pancreatitis. No biliary ductal dilatation. Cysts are present in the pancreatic tail, the largest measuring 3.9 cm. There is a 1.4 cm cyst seen distal to this. A cyst is present at the tip of the tail measuring 1.4 cm. No acute inflammatory changes are seen. Spleen: Normal in size without focal abnormality. Adrenals/Urinary  Tract: The adrenal glands are within normal limits. No renal calculus or hydronephrosis bilaterally. The bladder is unremarkable. Stomach/Bowel: Stomach is within normal limits. Appendix appears normal. There are mildly distended fluid-filled loops of small bowel in the pelvis measuring up to 3.3 cm. No transition point is seen. No focal bowel wall thickening or surrounding inflammatory changes. No free air or pneumatosis. Vascular/Lymphatic: Aortic atherosclerosis. No enlarged abdominal or pelvic lymph nodes. Reproductive: Prostate is unremarkable. Other: No abdominopelvic ascites. Musculoskeletal: No acute osseous abnormality. IMPRESSION: 1. Sequela of chronic pancreatitis with multiple pseudocysts in the pancreatic tail, increased in size from the prior exam. No acute inflammatory changes are seen. 2. Mildly distended fluid-filled loops of small bowel in the pelvis measuring up to 3.3 cm. No transition point is identified. Findings may represent ileus versus partial or early small bowel obstruction. 3. Emphysema. 4. Aortic atherosclerosis. Electronically Signed   By: Thornell Sartorius M.D.   On: 01/09/2023 01:01   DG Chest 2 View  Result Date: 01/08/2023 CLINICAL DATA:  Chest pain on left EXAM: CHEST - 2 VIEW COMPARISON:  01/01/2023 FINDINGS: Normal cardiomediastinal silhouette. No focal consolidation, pleural effusion, or pneumothorax. No displaced rib fractures. IMPRESSION: No acute cardiopulmonary disease. Electronically Signed   By: Minerva Fester M.D.   On: 01/08/2023 19:28    EKG: Independently reviewed.  Sinus rhythm with short PR interval and T wave abnormality in anterior leads.  No significant change compared to previous EKGs.  Assessment and Plan  Epigastric abdominal pain, vomiting Possible ileus versus partial early SBO Patient presenting with complaints of epigastric abdominal  pain, nausea, and vomiting.  CT showing mildly distended fluid-filled loops of small bowel in the pelvis measuring  up to 3.3 cm but no transition point identified.  Radiologist thinks findings may represent ileus versus partial or early SBO.  No NG tube placed in the ED as patient is not actively vomiting.  No abdominal distention on exam.  No constipation, instead, he is endorsing diarrhea since his recent hospital discharge which has improved, last bowel movement was at 11:30 PM tonight.  General surgery consulted.  Keep n.p.o. except sips with meds as he is no longer vomiting, IV fluid hydration, antiemetic as needed, and continue pain management.  Chronic pancreatitis Pancreatic pseudocysts CT showing sequela of chronic pancreatitis with multiple pseudocysts in the pancreatic tail, increase in size from the prior exam.  No acute inflammatory changes are seen.  Lipase is normal.  No fever or leukocytosis.  Continue Creon.  Hypertension Slightly hypertensive.  Continue metoprolol.  Alcohol use disorder No signs of withdrawal at this time.  Placed on CIWA protocol.  Paroxysmal A-fib Not on chronic anticoagulation due to substance abuse and nonadherence.  Continue metoprolol.  GERD/PUD Continue Protonix and Carafate  Asthma Stable, no signs of acute exacerbation.  Continue albuterol as needed.  DVT prophylaxis: SCDs Code Status: Full Code (discussed with the patient) Level of care: Telemetry bed Admission status: It is my clinical opinion that referral for OBSERVATION is reasonable and necessary in this patient based on the above information provided. The aforementioned taken together are felt to place the patient at high risk for further clinical deterioration. However, it is anticipated that the patient may be medically stable for discharge from the hospital within 24 to 48 hours.  John Giovanni MD [facility]  If 7PM-7AM, please contact night-coverage [email or web address]  01/09/2023, 2:36 AM

## 2023-01-09 NOTE — Progress Notes (Signed)
[name]  OJJ:[id number] DOB: 05-22-1970 DOA: 01/08/2023 PCP: Pcp, No    Brief Narrative:  52 year old with a history of alcohol cocaine and tobacco abuse, PAF not on anticoagulation due to substance abuse, WPW syndrome status post ablation, chronic alcohol associated pancreatitis with pseudocyst, HTN, bipolar disorder, asthma/chronic bronchitis, seizure disorder, chronic back pain, and HLD who was admitted 8/25-8/29 with an acute exacerbation of his chronic pancreatitis due to ongoing alcohol use and returned to the ER 9/1 with complaints of epigastric abdominal pain nausea and vomiting.  Lipase was normal at presentation.  CT abdomen noted sequelae of chronic pancreatitis with mildly distended fluid-filled loops of small bowel in the pelvis consistent with ileus versus PSBO.  Goals of Care:   Code Status: Full Code   DVT prophylaxis: SCDs Start: 01/09/23 0330  Interim Hx: Afebrile since admission.  Blood pressure modestly elevated with systolics into the 150 range.  The patient reports persistent epigastric and also lower abdominal pain.  He denies nausea or vomiting.  He tolerated the ingestion of his contrast material without difficulty.  Assessment & Plan:  Ileus versus PSBO Small bowel obstruction protocol initiated by General Surgery -follow-up KUB pending  Chronic alcohol related pancreatitis with multiple pancreatic pseudocysts Continue Creon -counseled again on alcohol abstinence  HTN Likely acutely exacerbated due to pain -monitor without change in medical therapy for now  Polysubstance abuse Counseled on need to abstain from substances of abuse  Paroxysmal atrial fibrillation Rate presently controlled/normal sinus -agree he is an exceptionally poor candidate for anticoagulation  GERD/PUD Continue usual outpatient therapies  Asthma Well compensated presently  Family Communication: No family present at time of exam Disposition: Will depend upon clinical  progression   Objective: Blood pressure (!) 152/99, pulse 75, temperature 97.7 F (36.5 C), temperature source Oral, resp. rate 17, height 5\' 8"  (1.727 m), weight 55.8 kg, SpO2 98%.  Intake/Output Summary (Last 24 hours) at 01/09/2023 1025 Last data filed at 01/09/2023 0500 Gross per 24 hour  Intake 1033.17 ml  Output --  Net 1033.17 ml   Filed Weights   01/08/23 1826 01/09/23 0612  Weight: 55.8 kg 55.8 kg    Examination: General: No acute respiratory distress Lungs: Clear to auscultation bilaterally without wheezes or crackles Cardiovascular: Regular rate and rhythm without murmur gallop or rub normal S1 and S2 Abdomen: Nondistended, soft, tender to palpation diffusely with no appreciable mass Extremities: No significant cyanosis, clubbing, or edema bilateral lower extremities  CBC: Recent Labs  Lab 01/04/23 0548 01/08/23 1838 01/09/23 0609  WBC 9.9 8.4 7.3  HGB 9.2* 9.5* 9.4*  HCT 29.8* 30.4* 30.9*  MCV 93.1 89.4 89.3  PLT 137* 163 159   Basic Metabolic Panel: Recent Labs  Lab 01/03/23 1110 01/04/23 0548 01/05/23 0158 01/08/23 1838 01/09/23 0609  NA 139 138 134* 137 137  K 4.3 5.1 4.6 3.8 3.5  CL 112* 109 104 104 105  CO2 19* 23 21* 20* 21*  GLUCOSE 65* 86 93 83 89  BUN <5* <5* 8 9 7   CREATININE 0.66 1.09 1.02 0.88 0.80  CALCIUM 8.1* 8.7* 8.6* 8.3* 8.2*  MG 1.2* 1.8  --   --   --   PHOS 2.6  --   --   --   --    GFR: Estimated Creatinine Clearance: 84.3 mL/min (by C-G formula based on SCr of 0.8 mg/dL).   Scheduled Meds:  folic acid  1 mg Oral Daily   lipase/protease/amylase  72,000 Units Oral TID WC  metoprolol  200 mg Oral Daily   multivitamin with minerals  1 tablet Oral Daily   pantoprazole  40 mg Oral Daily   pregabalin  100 mg Oral BID   sucralfate  1 g Oral TID   thiamine  100 mg Oral Daily   Or   thiamine  100 mg Intravenous Daily   Continuous Infusions:  sodium chloride 125 mL/hr at 01/09/23 0443     LOS: 0 days   Lonia Blood, MD [facility] Office  [phone] Pager - Text Page per Loretha Stapler  If 7PM-7AM, please contact night-coverage per Amion 01/09/2023, 10:25 AM

## 2023-01-10 DIAGNOSIS — R1013 Epigastric pain: Secondary | ICD-10-CM | POA: Diagnosis not present

## 2023-01-10 LAB — COMPREHENSIVE METABOLIC PANEL
ALT: 23 U/L (ref 0–44)
AST: 24 U/L (ref 15–41)
Albumin: 3 g/dL — ABNORMAL LOW (ref 3.5–5.0)
Alkaline Phosphatase: 79 U/L (ref 38–126)
Anion gap: 15 (ref 5–15)
BUN: <5 mg/dL — ABNORMAL LOW (ref 6–20)
CO2: 19 mmol/L — ABNORMAL LOW (ref 22–32)
Calcium: 8.5 mg/dL — ABNORMAL LOW (ref 8.9–10.3)
Chloride: 108 mmol/L (ref 98–111)
Creatinine, Ser: 0.67 mg/dL (ref 0.61–1.24)
GFR, Estimated: >60 mL/min (ref >60)
Glucose, Bld: 89 mg/dL (ref 70–99)
Potassium: 4.6 mmol/L (ref 3.5–5.1)
Sodium: 142 mmol/L (ref 135–145)
Total Bilirubin: 0.5 mg/dL (ref 0.3–1.2)
Total Protein: 5.8 g/dL — ABNORMAL LOW (ref 6.5–8.1)

## 2023-01-10 LAB — CBC
HCT: 30.5 % — ABNORMAL LOW (ref 39.0–52.0)
Hemoglobin: 9.3 g/dL — ABNORMAL LOW (ref 13.0–17.0)
MCH: 27.2 pg (ref 26.0–34.0)
MCHC: 30.5 g/dL (ref 30.0–36.0)
MCV: 89.2 fL (ref 80.0–100.0)
Platelets: 145 10*3/uL — ABNORMAL LOW (ref 150–400)
RBC: 3.42 MIL/uL — ABNORMAL LOW (ref 4.22–5.81)
RDW: 17.9 % — ABNORMAL HIGH (ref 11.5–15.5)
WBC: 8.5 10*3/uL (ref 4.0–10.5)
nRBC: 0.2 % (ref 0.0–0.2)

## 2023-01-10 LAB — MAGNESIUM: Magnesium: 1.4 mg/dL — ABNORMAL LOW (ref 1.7–2.4)

## 2023-01-10 LAB — PHOSPHORUS: Phosphorus: 3.1 mg/dL (ref 2.5–4.6)

## 2023-01-10 MED ORDER — OXYCODONE HCL 5 MG PO TABS: 5.0000 mg | ORAL_TABLET | Freq: Four times a day (QID) | ORAL | Status: DC | PRN

## 2023-01-10 MED ORDER — ACETAMINOPHEN 325 MG PO TABS
650.0000 mg | ORAL_TABLET | Freq: Four times a day (QID) | ORAL | Status: DC | PRN
  Administered 2023-01-10 – 2023-01-11 (×3): 650 mg via ORAL
  Filled 2023-01-10 (×3): qty 2

## 2023-01-10 MED ORDER — OXYCODONE HCL 5 MG PO TABS
5.0000 mg | ORAL_TABLET | Freq: Four times a day (QID) | ORAL | Status: DC | PRN
  Administered 2023-01-10 – 2023-01-11 (×2): 5 mg via ORAL
  Filled 2023-01-10 (×2): qty 1

## 2023-01-10 MED ORDER — HYDROMORPHONE HCL 1 MG/ML IJ SOLN
1.0000 mg | INTRAMUSCULAR | Status: DC | PRN
  Administered 2023-01-10 – 2023-01-11 (×8): 1 mg via INTRAVENOUS
  Filled 2023-01-10 (×8): qty 1

## 2023-01-10 MED ORDER — MAGNESIUM SULFATE 4 GM/100ML IV SOLN
4.0000 g | Freq: Once | INTRAVENOUS | Status: AC
  Administered 2023-01-10: 4 g via INTRAVENOUS
  Filled 2023-01-10: qty 100

## 2023-01-10 NOTE — Plan of Care (Signed)

## 2023-01-10 NOTE — Plan of Care (Signed)
  Problem: Activity: Goal: Risk for activity intolerance will decrease Outcome: Progressing   Problem: Nutrition: Goal: Adequate nutrition will be maintained Outcome: Progressing   Problem: Coping: Goal: Level of anxiety will decrease Outcome: Progressing   Problem: Elimination: Goal: Will not experience complications related to bowel motility Outcome: Progressing   Problem: Safety: Goal: Ability to remain free from injury will improve Outcome: Progressing   

## 2023-01-10 NOTE — Progress Notes (Signed)
Transition of Care [facility]) - Inpatient Brief Assessment   Patient Details  Name: [name] MRN: [id number] Date of Birth: 12/23/1970  Transition of Care [facility]) CM/SW Contact:    Janae Bridgeman, RN Phone Number: 01/10/2023, 4:33 PM   Clinical Narrative: Patient readmitted to the hospital for abdominal pain, esophagitis, gastroenteritis and digestive disorders.  Patient with recent history of ETOH pancreatis and history of ETOH, tobacco use, cocaine and THC.  Resources provided in the AVS.  Patient has current hospital follow up scheduled for [facility] follow up that was set up from previous admission - January 25, 2023 at 9:30 am - noted in AVS.  No other TOC needs at this time.   Transition of Care Asessment: Insurance and Status: (P) Insurance coverage has been reviewed Patient has primary care physician: (P) Yes (pre[facility] scheduled with [facility] - noted in AVS) Home environment has been reviewed: (P) Lives at home with mother Prior level of function:: (P) Independent Prior/Current Home Services: (P) No current home services Social Determinants of Health Reivew: (P) SDOH reviewed interventions complete Readmission risk has been reviewed: (P) Yes Transition of care needs: (P) no transition of care needs at this time

## 2023-01-10 NOTE — Progress Notes (Signed)
Patient walked off unit. Patient informed that he cannot walk off the floor. Patient started cursing at staff that we "cannot tell him what to fucking do".   Staff told patient to stop yelling and to go to his room. Patient continued to be belligerent and curse at staff in hallway.   Security called and arrived after situation was deescalated. Security asked to go into room to have discussion with patient regarding his behaviors.

## 2023-01-10 NOTE — Progress Notes (Signed)
[facility] Progress Note     Subjective: CC-  States that he still has some abdominal pain, feels more like his chronic abdominal pain. Bloating improved. States that he is passing flatus and having bowel movements but RN states no BM. Also states that he is still vomiting but nurse says he has had no emesis. Wants something to eat.  Objective: Vital signs in last 24 hours: Temp:  [97.4 F (36.3 C)-98.1 F (36.7 C)] 97.4 F (36.3 C) (09/03 0506) Pulse Rate:  [52-79] 79 (09/03 0506) Resp:  [17-18] 17 (09/03 0506) BP: (131-195)/(80-100) 131/80 (09/03 0506) SpO2:  [100 %] 100 % (09/03 0506) Last BM Date : 01/07/23  Intake/Output from previous day: 09/02 0701 - 09/03 0700 In: 1463.9 [I.V.:1463.9] Out: -  Intake/Output this shift: No intake/output data recorded.  PE: Gen:  Alert, NAD Abd: soft, minimal distension, nontender  Lab Results:  Recent Labs    01/08/23 1838 01/09/23 0609  WBC 8.4 7.3  HGB 9.5* 9.4*  HCT 30.4* 30.9*  PLT 163 159   BMET Recent Labs    01/08/23 1838 01/09/23 0609  NA 137 137  K 3.8 3.5  CL 104 105  CO2 20* 21*  GLUCOSE 83 89  BUN 9 7  CREATININE 0.88 0.80  CALCIUM 8.3* 8.2*   PT/INR No results for input(s): "LABPROT", "INR" in the last 72 hours. CMP     Component Value Date/Time   NA 137 01/09/2023 0609   NA 138 05/22/2020 1451   K 3.5 01/09/2023 0609   CL 105 01/09/2023 0609   CO2 21 (L) 01/09/2023 0609   GLUCOSE 89 01/09/2023 0609   BUN 7 01/09/2023 0609   BUN 14 05/22/2020 1451   CREATININE 0.80 01/09/2023 0609   CREATININE 0.65 11/08/2012 1226   CALCIUM 8.2 (L) 01/09/2023 0609   PROT 7.0 01/08/2023 1838   PROT 7.5 05/22/2020 1451   ALBUMIN 3.4 (L) 01/08/2023 1838   ALBUMIN 4.7 05/22/2020 1451   AST 30 01/08/2023 1838   ALT 28 01/08/2023 1838   ALKPHOS 84 01/08/2023 1838   BILITOT 0.6 01/08/2023 1838   BILITOT 0.3 05/22/2020 1451   GFRNONAA >60 01/09/2023 0609   GFRNONAA >89 11/08/2012 1226   GFRAA  126 05/22/2020 1451   GFRAA >89 11/08/2012 1226   Lipase     Component Value Date/Time   LIPASE 22 01/08/2023 1838       Studies/Results: DG Abd Portable 1V-Small Bowel Obstruction Protocol-initial, 8 hr delay  Result Date: 01/09/2023 CLINICAL DATA:  Small bowel protocol, 8 hour delay. EXAM: PORTABLE ABDOMEN - 1 VIEW COMPARISON:  CT earlier today FINDINGS: Administered enteric contrast is seen within the ascending, transverse, descending and rectosigmoid colon. No gaseous small bowel distension. IMPRESSION: Enteric contrast throughout the colon. No gaseous small bowel distension. Electronically Signed   By: Narda Rutherford M.D.   On: 01/09/2023 21:20   CT ABDOMEN PELVIS WO CONTRAST  Result Date: 01/09/2023 CLINICAL DATA:  Pancreatitis, acute, severe. EXAM: CT ABDOMEN AND PELVIS WITHOUT CONTRAST TECHNIQUE: Multidetector CT imaging of the abdomen and pelvis was performed following the standard protocol without IV contrast. RADIATION DOSE REDUCTION: This exam was performed according to the departmental dose-optimization program which includes automated exposure control, adjustment of the mA and/or kV according to patient size and/or use of iterative reconstruction technique. COMPARISON:  11/28/2022. FINDINGS: Lower chest: Emphysematous changes are noted in the lungs. Mild atelectasis or scarring is noted at the lung bases. There is a trace pericardial effusion.  Hepatobiliary: No focal liver abnormality is seen. No gallstones, gallbladder wall thickening, or biliary dilatation. Pancreas: Multiple calcifications are noted in the pancreas, compatible with sequela of chronic pancreatitis. No biliary ductal dilatation. Cysts are present in the pancreatic tail, the largest measuring 3.9 cm. There is a 1.4 cm cyst seen distal to this. A cyst is present at the tip of the tail measuring 1.4 cm. No acute inflammatory changes are seen. Spleen: Normal in size without focal abnormality. Adrenals/Urinary Tract: The  adrenal glands are within normal limits. No renal calculus or hydronephrosis bilaterally. The bladder is unremarkable. Stomach/Bowel: Stomach is within normal limits. Appendix appears normal. There are mildly distended fluid-filled loops of small bowel in the pelvis measuring up to 3.3 cm. No transition point is seen. No focal bowel wall thickening or surrounding inflammatory changes. No free air or pneumatosis. Vascular/Lymphatic: Aortic atherosclerosis. No enlarged abdominal or pelvic lymph nodes. Reproductive: Prostate is unremarkable. Other: No abdominopelvic ascites. Musculoskeletal: No acute osseous abnormality. IMPRESSION: 1. Sequela of chronic pancreatitis with multiple pseudocysts in the pancreatic tail, increased in size from the prior exam. No acute inflammatory changes are seen. 2. Mildly distended fluid-filled loops of small bowel in the pelvis measuring up to 3.3 cm. No transition point is identified. Findings may represent ileus versus partial or early small bowel obstruction. 3. Emphysema. 4. Aortic atherosclerosis. Electronically Signed   By: Thornell Sartorius M.D.   On: 01/09/2023 01:01   DG Chest 2 View  Result Date: 01/08/2023 CLINICAL DATA:  Chest pain on left EXAM: CHEST - 2 VIEW COMPARISON:  01/01/2023 FINDINGS: Normal cardiomediastinal silhouette. No focal consolidation, pleural effusion, or pneumothorax. No displaced rib fractures. IMPRESSION: No acute cardiopulmonary disease. Electronically Signed   By: Minerva Fester M.D.   On: 01/08/2023 19:28    Anti-infectives: Anti-infectives (From admission, onward)    None        Assessment/Plan Ileus vs SBO - Follow up film after PO gastrograffin with contrast in colon and no small bowel distension. Bowel function unclear but abdominal exam benign. Trial clear liquids and advance as tolerated.  ID - none FEN - CLD VTE - ok for chemical dvt ppx from surgical standpoint Foley - none  Chronic alcohol associated pancreatitis with  pseudocyst PAF not on anticoagulation due to substance abuse WPW syndrome status post ablation HTN Bipolar disorder Asthma/chronic bronchitis Seizure disorder Chronic back pain HLD  Hx substance abuse  I reviewed hospitalist notes, last 24 h vitals and pain scores, last 48 h intake and output, last 24 h labs and trends, and last 24 h imaging results.    LOS: 1 day    Franne Forts, [facility] 01/10/2023, 9:55 AM Please see Amion for pager number during day hours 7:00am-4:30pm

## 2023-01-10 NOTE — Progress Notes (Signed)
Patient has been verbally aggressive throughout the shift because he want's to eat and drink. Patient is NPO and has been educated about being NPO. Patient kept coming at the nurses station cursing at staff and demanded to speak with the MD. Situation was deescalated with no further actions needed. Will continue to monitor.

## 2023-01-10 NOTE — Progress Notes (Addendum)
[name]  MVH:[id number] DOB: 1970-08-19 DOA: 01/08/2023 PCP: Pcp, No    Brief Narrative:  52 year old with a history of alcohol cocaine and tobacco abuse, PAF not on anticoagulation due to substance abuse, WPW syndrome status post ablation, chronic alcohol associated pancreatitis with pseudocyst, HTN, bipolar disorder, asthma/chronic bronchitis, seizure disorder, chronic back pain, and HLD who was admitted 8/25-8/29 with an acute exacerbation of his chronic pancreatitis due to ongoing alcohol use and returned to the ER 9/1 with complaints of epigastric abdominal pain nausea and vomiting.  Lipase was normal at presentation.  CT abdomen noted sequelae of chronic pancreatitis with mildly distended fluid-filled loops of small bowel in the pelvis consistent with ileus versus PSBO.  Goals of Care:   Code Status: Full Code   DVT prophylaxis: SCDs Start: 01/09/23 0330  Interim Hx: Afebrile.  Vital signs stable.  Patient was found to be smoking in his room yesterday.  He also became very agitated last night demanding to be allowed to eat.  KUB last evening noted contrast material within ascending transverse descending and rectosigmoid colon with no gaseous small bowel distention.  He admits to feeling better overall today.  He is quite hungry and anxious to have his diet advanced.  He denies chest pain or shortness of breath.  The patient has been advised that it is not acceptable for him to be verbally abusive to staff.  The patient has been advised that should he do so again, or attempt to leave the unit when he has been instructed it is not safe to do so, but he will be considered as leaving AMA and he will be discharged from the hospital.  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  HTN Reasonably well-controlled at this time  Polysubstance abuse Counseled on need to abstain from substances of abuse  Paroxysmal atrial fibrillation Rate presently controlled/normal sinus - agree he is an exceptionally poor candidate for anticoagulation  GERD/PUD Continue usual outpatient therapies  Asthma Well compensated presently  Family Communication: No family present at time of exam Disposition: Will depend upon clinical progression -suspect probable discharge home 9/4 if tolerates advancement of diet   Objective: Blood pressure (!) 145/94, pulse 84, temperature 98.6 F (37 C), temperature source Oral, resp. rate 18, height 5\' 8"  (1.727 m), weight 55.8 kg, SpO2 100%.  Intake/Output Summary (Last 24 hours) at 01/10/2023 1756 Last data filed at 01/10/2023 0654 Gross per 24 hour  Intake 1463.87 ml  Output --  Net 1463.87 ml   Filed Weights   01/08/23 1826 01/09/23 0612  Weight: 55.8 kg 55.8 kg    Examination: General: No acute respiratory distress Lungs: Clear to auscultation bilaterally  Cardiovascular: Regular rate and rhythm without murmur gallop or rub normal S1 and S2 Abdomen: Nondistended, soft, tender to palpation diffusely with no appreciable mass -exam essentially unchanged Extremities: No significant cyanosis, clubbing, or edema bilateral lower extremities  CBC: Recent Labs  Lab 01/08/23 1838 01/09/23 0609 01/10/23 1343  WBC 8.4 7.3 8.5  HGB 9.5* 9.4* 9.3*  HCT 30.4* 30.9* 30.5*  MCV 89.4  89.3 89.2  PLT 163 159 145*   Basic Metabolic Panel: Recent Labs  Lab 01/04/23 0548 01/05/23 0158 01/08/23 1838 01/09/23 0609 01/10/23 1343  NA 138   < > 137  137 142  K 5.1   < > 3.8 3.5 4.6  CL 109   < > 104 105 108  CO2 23   < > 20* 21* 19*  GLUCOSE 86   < > 83 89 89  BUN <5*   < > 9 7 <5*  CREATININE 1.09   < > 0.88 0.80 0.67  CALCIUM 8.7*   < > 8.3* 8.2* 8.5*  MG 1.8  --   --   --  1.4*  PHOS  --   --   --   --  3.1   < > = values in this interval not displayed.   GFR: Estimated Creatinine Clearance: 84.3 mL/min (by C-G formula based on SCr of 0.67 mg/dL).   Scheduled Meds:  folic acid  1 mg Oral Daily   lipase/protease/amylase  72,000 Units Oral TID WC   metoprolol  200 mg Oral Daily   multivitamin with minerals  1 tablet Oral Daily   pantoprazole  40 mg Oral Daily   pregabalin  100 mg Oral BID   sucralfate  1 g Oral TID   thiamine  100 mg Oral Daily   Or   thiamine  100 mg Intravenous Daily   Continuous Infusions:  magnesium sulfate bolus IVPB       LOS: 1 day   Lonia Blood, MD [facility] Office  [phone] Pager - Text Page per Loretha Stapler  If 7PM-7AM, please contact night-coverage per Amion 01/10/2023, 5:56 PM

## 2023-01-11 ENCOUNTER — Other Ambulatory Visit (HOSPITAL_COMMUNITY): Payer: Self-pay

## 2023-01-11 DIAGNOSIS — K56609 Unspecified intestinal obstruction, unspecified as to partial versus complete obstruction: Secondary | ICD-10-CM | POA: Diagnosis not present

## 2023-01-11 DIAGNOSIS — R1013 Epigastric pain: Secondary | ICD-10-CM | POA: Diagnosis not present

## 2023-01-11 DIAGNOSIS — Z8711 Personal history of peptic ulcer disease: Secondary | ICD-10-CM

## 2023-01-11 LAB — BASIC METABOLIC PANEL
Anion gap: 8 (ref 5–15)
BUN: <5 mg/dL — ABNORMAL LOW (ref 6–20)
CO2: 20 mmol/L — ABNORMAL LOW (ref 22–32)
Calcium: 8.6 mg/dL — ABNORMAL LOW (ref 8.9–10.3)
Chloride: 111 mmol/L (ref 98–111)
Creatinine, Ser: 0.75 mg/dL (ref 0.61–1.24)
GFR, Estimated: >60 mL/min (ref >60)
Glucose, Bld: 72 mg/dL (ref 70–99)
Potassium: 5.1 mmol/L (ref 3.5–5.1)
Sodium: 139 mmol/L (ref 135–145)

## 2023-01-11 LAB — CBC
HCT: 30.1 % — ABNORMAL LOW (ref 39.0–52.0)
Hemoglobin: 8.8 g/dL — ABNORMAL LOW (ref 13.0–17.0)
MCH: 27 pg (ref 26.0–34.0)
MCHC: 29.2 g/dL — ABNORMAL LOW (ref 30.0–36.0)
MCV: 92.3 fL (ref 80.0–100.0)
Platelets: 150 10*3/uL (ref 150–400)
RBC: 3.26 MIL/uL — ABNORMAL LOW (ref 4.22–5.81)
RDW: 17.7 % — ABNORMAL HIGH (ref 11.5–15.5)
WBC: 7 10*3/uL (ref 4.0–10.5)
nRBC: 0 % (ref 0.0–0.2)

## 2023-01-11 LAB — MAGNESIUM: Magnesium: 1.8 mg/dL (ref 1.7–2.4)

## 2023-01-11 MED ORDER — PANCRELIPASE (LIP-PROT-AMYL) 36000-114000 UNITS PO CPEP
ORAL_CAPSULE | ORAL | 2 refills | Status: DC
  Filled 2023-01-11: qty 240, 30d supply, fill #0

## 2023-01-11 MED ORDER — HYDROXYZINE PAMOATE 25 MG PO CAPS
25.0000 mg | ORAL_CAPSULE | Freq: Three times a day (TID) | ORAL | 0 refills | Status: DC | PRN
  Filled 2023-01-11: qty 20, 7d supply, fill #0

## 2023-01-11 MED ORDER — LIDOCAINE VISCOUS HCL 2 % MT SOLN
15.0000 mL | Freq: Four times a day (QID) | OROMUCOSAL | 0 refills | Status: DC | PRN
  Filled 2023-01-11: qty 100, 2d supply, fill #0

## 2023-01-11 MED ORDER — SUCRALFATE 1 GM/10ML PO SUSP
1.0000 g | ORAL | 4 refills | Status: DC
Start: 2023-01-11 — End: 2023-05-26
  Filled 2023-01-11: qty 130, fill #0

## 2023-01-11 MED ORDER — PANTOPRAZOLE SODIUM 40 MG PO TBEC
40.0000 mg | DELAYED_RELEASE_TABLET | Freq: Every day | ORAL | 0 refills | Status: DC
  Filled 2023-01-11: qty 30, 30d supply, fill #0

## 2023-01-11 MED ORDER — OXYCODONE HCL 5 MG PO TABS
5.0000 mg | ORAL_TABLET | Freq: Four times a day (QID) | ORAL | 0 refills | Status: AC | PRN
  Filled 2023-01-11: qty 20, 5d supply, fill #0

## 2023-01-11 NOTE — Hospital Course (Signed)
52 year old with a history of alcohol cocaine and tobacco abuse, PAF not on anticoagulation due to substance abuse, WPW syndrome status post ablation, chronic alcohol associated pancreatitis with pseudocyst, HTN, bipolar disorder, asthma/chronic bronchitis, seizure disorder, chronic back pain, and HLD who was admitted 8/25-8/29 with an acute exacerbation of his chronic pancreatitis due to ongoing alcohol use and returned to the ER 9/1 with complaints of epigastric abdominal pain nausea and vomiting. Lipase was normal at presentation. CT abdomen noted sequelae of chronic pancreatitis with mildly distended fluid-filled loops of small bowel in the pelvis consistent with ileus versus PSBO.

## 2023-01-11 NOTE — Progress Notes (Signed)
   01/11/23 1713  Spiritual Encounters  Type of Visit Initial  Care provided to: Patient  Conversation partners present during encounter Nurse  Reason for visit Routine spiritual support  OnCall Visit No   Patient is dealing with alcohol-related disease. He wants to stop drinking and enter a treatment program.  Family is in turmoil.  He wants to live to be with his daughter and one grandchild.  He'd like a treatment facility that will keep him 2 months.  I asked nurses to have a social worker visit with him.  Gwendoline Judy Lile-King

## 2023-01-11 NOTE — Discharge Summary (Signed)
Physician Discharge Summary   Patient: [name] MRN: [id number] DOB: 1971/05/03  Admit date:     01/08/2023  Discharge date: 01/11/23  Discharge Physician: Rickey Barbara   PCP: Pcp, No   Recommendations at discharge:    Follow up with PCP as scheduled at [facility] on 9/18  Discharge Diagnoses: Principal Problem:   Epigastric abdominal pain Active Problems:   Essential hypertension   Chronic pancreatitis (HCC)   Alcohol use disorder   GERD (gastroesophageal reflux disease)   Asthma, chronic   Ileus (HCC)  Resolved Problems:   * No resolved hospital problems. *  Hospital Course: 52 year old with a history of alcohol cocaine and tobacco abuse, PAF not on anticoagulation due to substance abuse, WPW syndrome status post ablation, chronic alcohol associated pancreatitis with pseudocyst, HTN, bipolar disorder, asthma/chronic bronchitis, seizure disorder, chronic back pain, and HLD who was admitted 8/25-8/29 with an acute exacerbation of his chronic pancreatitis due to ongoing alcohol use and returned to the ER 9/1 with complaints of epigastric abdominal pain nausea and vomiting. Lipase was normal at presentation. CT abdomen noted sequelae of chronic pancreatitis with mildly distended fluid-filled loops of small bowel in the pelvis consistent with ileus versus PSBO.   Assessment and Plan: Ileus versus PSBO Small bowel obstruction protocol initiated by General Surgery -follow-up KUB suggests resolution of ileus/no persisting obstruction -advanced to regular diet -N[facility] registry reviewed. 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 Continue Creon -Cessation done at bedside in depth   Hypomagnesemia -replaced   HTN Reasonably well-controlled at this time    Polysubstance abuse Counseled on need to abstain from substances of abuse   Paroxysmal atrial fibrillation Rate presently controlled/normal sinus - agree he is an exceptionally poor candidate for anticoagulation   GERD/PUD Continue usual outpatient therapies   Asthma Well compensated presently    Consultants: General Surgery Procedures performed:   Disposition: Home Diet recommendation:  Regular diet DISCHARGE MEDICATION: Allergies as of 01/11/2023       Reactions   Robaxin [methocarbamol] Other (See Comments)   jumpy limbs   Aspirin Other (See Comments)   Unknown reaction   Shellfish-derived Products Nausea And Vomiting, Rash   Trazodone And Nefazodone Other (See Comments)   Muscle spasms   Adhesive [tape] Itching   Latex Itching   Toradol [ketorolac Tromethamine] Other (See Comments)   Has ulcers; cannot have this   Contrast Media [iodinated Contrast Media] Hives   Reglan [metoclopramide] Other (See Comments)   Muscle spasms   Salmon [fish Oil] Nausea And Vomiting, Rash        Medication List     STOP taking these medications    ferrous sulfate 325 (65 FE) MG tablet       TAKE these medications    albuterol 108 (90 Base) MCG/ACT inhaler Commonly known as: VENTOLIN HFA Inhale 2 puffs into the lungs every 4 (four) hours as needed for wheezing or shortness of breath.   famotidine 20 MG tablet Commonly known as: PEPCID Take 1 tablet (20 mg total) by mouth 2 (two) times daily. What changed:  when to take this reasons to take this   fluticasone 50 MCG/ACT nasal spray Commonly known as: FLONASE Place 2 sprays into both nostrils in the morning, at noon, and at bedtime.  folic acid 1 MG tablet Commonly known as: FOLVITE Take 1 tablet (1 mg total) by mouth daily.   guaiFENesin 100 MG/5ML liquid Commonly known as: ROBITUSSIN Take 5 mLs by mouth every 4 (four) hours as needed for cough or to loosen phlegm.   hydrOXYzine 25 MG capsule Commonly known  as: Vistaril Take 1 capsule (25 mg total) by mouth every 8 (eight) hours as needed for anxiety.   lidocaine 2 % solution Commonly known as: XYLOCAINE Use as directed 15 mLs in the mouth or throat every 6 (six) hours as needed for mouth pain.   lipase/protease/amylase [address] UNITS Cpep capsule Commonly known as: Creon Take 2 capsules (72,000 Units total) by mouth 3 (three) times daily with meals. May also take 1 capsule (36,000 Units total) as needed (with snacks).   metoprolol 200 MG 24 hr tablet Commonly known as: TOPROL-XL Take 200 mg by mouth daily.   multivitamin with minerals Tabs tablet Take 2 tablets by mouth daily.   nitroGLYCERIN 0.4 MG SL tablet Commonly known as: NITROSTAT Place 0.4 mg under the tongue every 5 (five) minutes as needed for chest pain.   ondansetron 4 MG disintegrating tablet Commonly known as: ZOFRAN-ODT Take 1 tablet (4 mg total) by mouth 2 (two) times daily as needed for nausea or vomiting.   oxyCODONE 5 MG immediate release tablet Commonly known as: Roxicodone Take 1 tablet (5 mg total) by mouth every 6 (six) hours as needed for up to 5 days for severe pain or moderate pain.   pantoprazole 40 MG tablet Commonly known as: Protonix Take 1 tablet (40 mg total) by mouth daily.   pregabalin 100 MG capsule Commonly known as: Lyrica Take 1 capsule (100 mg total) by mouth 2 (two) times daily.   sucralfate 1 GM/10ML suspension Commonly known as: Carafate Take 10 mLs (1 g total) by mouth See admin instructions. Take 4 times with meals and at bedtime. What changed:  when to take this additional instructions        Follow-up Information     [facility] Follow up on 01/25/2023.   Why: You are scheduled for hopsital follow up on January 25, 2023 at 930 am. Contact information: [address] [place] [address] [phone]               Discharge Exam: Ceasar Mons Weights    01/08/23 1826 01/09/23 0612  Weight: 55.8 kg 55.8 kg   General exam: Awake, laying in bed, in nad Respiratory system: Normal respiratory effort, no wheezing Cardiovascular system: regular rate, s1, s2 Gastrointestinal system: Soft, nondistended, positive BS Central nervous system: CN2-12 grossly intact, strength intact Extremities: Perfused, no clubbing Skin: Normal skin turgor, no notable skin lesions seen Psychiatry: Mood normal // no visual hallucinations   Condition at discharge: fair  The results of significant diagnostics from this hospitalization (including imaging, microbiology, ancillary and laboratory) are listed below for reference.   Imaging Studies: DG Abd Portable 1V-Small Bowel Obstruction Protocol-initial, 8 hr delay  Result Date: 01/09/2023 CLINICAL DATA:  Small bowel protocol, 8 hour delay. EXAM: PORTABLE ABDOMEN - 1 VIEW COMPARISON:  CT earlier today FINDINGS: Administered enteric contrast is seen within the ascending, transverse, descending and rectosigmoid colon. No gaseous small bowel distension. IMPRESSION: Enteric contrast throughout the colon. No gaseous small bowel distension. Electronically Signed   By: Narda Rutherford M.D.   On: 01/09/2023 21:20   CT ABDOMEN PELVIS WO CONTRAST  Result Date: 01/09/2023  CLINICAL DATA:  Pancreatitis, acute, severe. EXAM: CT ABDOMEN AND PELVIS WITHOUT CONTRAST TECHNIQUE: Multidetector CT imaging of the abdomen and pelvis was performed following the standard protocol without IV contrast. RADIATION DOSE REDUCTION: This exam was performed according to the departmental dose-optimization program which includes automated exposure control, adjustment of the mA and/or kV according to patient size and/or use of iterative reconstruction technique. COMPARISON:  11/28/2022. FINDINGS: Lower chest: Emphysematous changes are noted in the lungs. Mild atelectasis or scarring is noted at the lung bases. There is a trace pericardial effusion.  Hepatobiliary: No focal liver abnormality is seen. No gallstones, gallbladder wall thickening, or biliary dilatation. Pancreas: Multiple calcifications are noted in the pancreas, compatible with sequela of chronic pancreatitis. No biliary ductal dilatation. Cysts are present in the pancreatic tail, the largest measuring 3.9 cm. There is a 1.4 cm cyst seen distal to this. A cyst is present at the tip of the tail measuring 1.4 cm. No acute inflammatory changes are seen. Spleen: Normal in size without focal abnormality. Adrenals/Urinary Tract: The adrenal glands are within normal limits. No renal calculus or hydronephrosis bilaterally. The bladder is unremarkable. Stomach/Bowel: Stomach is within normal limits. Appendix appears normal. There are mildly distended fluid-filled loops of small bowel in the pelvis measuring up to 3.3 cm. No transition point is seen. No focal bowel wall thickening or surrounding inflammatory changes. No free air or pneumatosis. Vascular/Lymphatic: Aortic atherosclerosis. No enlarged abdominal or pelvic lymph nodes. Reproductive: Prostate is unremarkable. Other: No abdominopelvic ascites. Musculoskeletal: No acute osseous abnormality. IMPRESSION: 1. Sequela of chronic pancreatitis with multiple pseudocysts in the pancreatic tail, increased in size from the prior exam. No acute inflammatory changes are seen. 2. Mildly distended fluid-filled loops of small bowel in the pelvis measuring up to 3.3 cm. No transition point is identified. Findings may represent ileus versus partial or early small bowel obstruction. 3. Emphysema. 4. Aortic atherosclerosis. Electronically Signed   By: Thornell Sartorius M.D.   On: 01/09/2023 01:01   DG Chest 2 View  Result Date: 01/08/2023 CLINICAL DATA:  Chest pain on left EXAM: CHEST - 2 VIEW COMPARISON:  01/01/2023 FINDINGS: Normal cardiomediastinal silhouette. No focal consolidation, pleural effusion, or pneumothorax. No displaced rib fractures. IMPRESSION: No acute  cardiopulmonary disease. Electronically Signed   By: Minerva Fester M.D.   On: 01/08/2023 19:28   DG Chest 2 View  Result Date: 01/01/2023 CLINICAL DATA:  Chest pain EXAM: CHEST - 2 VIEW COMPARISON:  None Available. FINDINGS: The heart size and mediastinal contours are within normal limits. No consolidation, pneumothorax or effusion. No edema. The visualized skeletal structures are unremarkable. Overlapping cardiac leads. Air-fluid level along the stomach beneath the left hemidiaphragm. IMPRESSION: No acute cardiopulmonary disease Electronically Signed   By: Karen Kays M.D.   On: 01/01/2023 18:32   DG Chest 2 View  Result Date: 12/17/2022 CLINICAL DATA:  52 year old male with chest pain and dizziness. EXAM: CHEST - 2 VIEW COMPARISON:  Portable chest 12/08/2022 and earlier. FINDINGS: AP and lateral views at 1113 hours. Lung volumes and mediastinal contours are normal. Visualized tracheal air column is within normal limits. Lung markings appear stable and within normal limits, both lungs appear clear. No pneumothorax or pleural effusion. Negative visible bowel gas and osseous structures. IMPRESSION: No acute cardiopulmonary abnormality. Electronically Signed   By: Odessa Fleming M.D.   On: 12/17/2022 11:18    Microbiology: Results for orders placed or performed during the hospital encounter of 07/19/22  Resp panel by RT-PCR (RSV, Flu  A&B, Covid) Anterior Nasal Swab     Status: None   Collection Time: 07/19/22  8:18 PM   Specimen: Anterior Nasal Swab  Result Value Ref Range Status   SARS Coronavirus 2 by RT PCR NEGATIVE NEGATIVE Final   Influenza A by PCR NEGATIVE NEGATIVE Final   Influenza B by PCR NEGATIVE NEGATIVE Final    Comment: (NOTE) The Xpert Xpress SARS-CoV-2/FLU/RSV plus assay is intended as an aid in the diagnosis of influenza from Nasopharyngeal swab specimens and should not be used as a sole basis for treatment. Nasal washings and aspirates are unacceptable for Xpert Xpress  SARS-CoV-2/FLU/RSV testing.  Fact Sheet for Patients: BloggerCourse.com  Fact Sheet for Healthcare Providers: SeriousBroker.it  This test is not yet approved or cleared by the [place] FDA and has been authorized for detection and/or diagnosis of SARS-CoV-2 by FDA under an Emergency Use Authorization (EUA). This EUA will remain in effect (meaning this test can be used) for the duration of the COVID-19 declaration under Section 564(b)(1) of the Act, 21 U.S.C. section 360bbb-3(b)(1), unless the authorization is terminated or revoked.     Resp Syncytial Virus by PCR NEGATIVE NEGATIVE Final    Comment: (NOTE) Fact Sheet for Patients: BloggerCourse.com  Fact Sheet for Healthcare Providers: SeriousBroker.it  This test is not yet approved or cleared by the [place] FDA and has been authorized for detection and/or diagnosis of SARS-CoV-2 by FDA under an Emergency Use Authorization (EUA). This EUA will remain in effect (meaning this test can be used) for the duration of the COVID-19 declaration under Section 564(b)(1) of the Act, 21 U.S.C. section 360bbb-3(b)(1), unless the authorization is terminated or revoked.  Performed at [facility], [address] [place], [place] [address]     Labs: CBC: Recent Labs  Lab 01/08/23 1838 01/09/23 0609 01/10/23 1343 01/11/23 0912  WBC 8.4 7.3 8.5 7.0  HGB 9.5* 9.4* 9.3* 8.8*  HCT 30.4* 30.9* 30.5* 30.1*  MCV 89.4 89.3 89.2 92.3  PLT 163 159 145* 150   Basic Metabolic Panel: Recent Labs  Lab 01/05/23 0158 01/08/23 1838 01/09/23 0609 01/10/23 1343 01/11/23 0912  NA 134* 137 137 142 139  K 4.6 3.8 3.5 4.6 5.1  CL 104 104 105 108 111  CO2 21* 20* 21* 19* 20*  GLUCOSE 93 83 89 89 72  BUN 8 9 7  <5* <5*  CREATININE 1.02 0.88 0.80 0.67 0.75  CALCIUM 8.6* 8.3* 8.2* 8.5* 8.6*  MG  --   --   --  1.4* 1.8  PHOS  --    --   --  3.1  --    Liver Function Tests: Recent Labs  Lab 01/05/23 0158 01/08/23 1838 01/10/23 1343  AST 56* 30 24  ALT 44 28 23  ALKPHOS 89 84 79  BILITOT 0.4 0.6 0.5  PROT 5.4* 7.0 5.8*  ALBUMIN 2.8* 3.4* 3.0*   CBG: Recent Labs  Lab 01/05/23 0743  GLUCAP 72    Discharge time spent: less than 30 minutes.  Signed: Rickey Barbara, MD [facility] 01/11/2023

## 2023-01-11 NOTE — Plan of Care (Signed)

## 2023-01-16 ENCOUNTER — Emergency Department (HOSPITAL_COMMUNITY): Payer: MEDICAID

## 2023-01-16 ENCOUNTER — Emergency Department (HOSPITAL_COMMUNITY)
Admission: EM | Admit: 2023-01-16 | Discharge: 2023-01-16 | Disposition: A | Discharge status: Home / Self Care | Payer: MEDICAID | Attending: Emergency Medicine | Admitting: Emergency Medicine

## 2023-01-16 ENCOUNTER — Encounter (HOSPITAL_COMMUNITY): Payer: Self-pay | Admitting: Emergency Medicine

## 2023-01-16 ENCOUNTER — Other Ambulatory Visit: Payer: Self-pay

## 2023-01-16 DIAGNOSIS — I10 Essential (primary) hypertension: Secondary | ICD-10-CM | POA: Insufficient documentation

## 2023-01-16 DIAGNOSIS — Z91040 Latex allergy status: Secondary | ICD-10-CM | POA: Diagnosis not present

## 2023-01-16 DIAGNOSIS — G8929 Other chronic pain: Secondary | ICD-10-CM | POA: Diagnosis not present

## 2023-01-16 DIAGNOSIS — J45909 Unspecified asthma, uncomplicated: Secondary | ICD-10-CM | POA: Insufficient documentation

## 2023-01-16 DIAGNOSIS — R079 Chest pain, unspecified: Secondary | ICD-10-CM | POA: Diagnosis present

## 2023-01-16 DIAGNOSIS — R109 Unspecified abdominal pain: Secondary | ICD-10-CM | POA: Insufficient documentation

## 2023-01-16 DIAGNOSIS — Z87891 Personal history of nicotine dependence: Secondary | ICD-10-CM | POA: Diagnosis not present

## 2023-01-16 DIAGNOSIS — I4891 Unspecified atrial fibrillation: Secondary | ICD-10-CM | POA: Diagnosis not present

## 2023-01-16 LAB — CBC WITH DIFFERENTIAL/PLATELET
Abs Immature Granulocytes: 0.03 10*3/uL (ref 0.00–0.07)
Basophils Absolute: 0.1 10*3/uL (ref 0.0–0.1)
Basophils Relative: 1 %
Eosinophils Absolute: 0.3 10*3/uL (ref 0.0–0.5)
Eosinophils Relative: 4 %
HCT: 32.5 % — ABNORMAL LOW (ref 39.0–52.0)
Hemoglobin: 9.8 g/dL — ABNORMAL LOW (ref 13.0–17.0)
Immature Granulocytes: 0 %
Lymphocytes Relative: 43 %
Lymphs Abs: 3 10*3/uL (ref 0.7–4.0)
MCH: 27.1 pg (ref 26.0–34.0)
MCHC: 30.2 g/dL (ref 30.0–36.0)
MCV: 90 fL (ref 80.0–100.0)
Monocytes Absolute: 1 10*3/uL (ref 0.1–1.0)
Monocytes Relative: 14 %
Neutro Abs: 2.6 10*3/uL (ref 1.7–7.7)
Neutrophils Relative %: 38 %
Platelets: 242 10*3/uL (ref 150–400)
RBC: 3.61 MIL/uL — ABNORMAL LOW (ref 4.22–5.81)
RDW: 17.5 % — ABNORMAL HIGH (ref 11.5–15.5)
WBC: 7 10*3/uL (ref 4.0–10.5)
nRBC: 0 % (ref 0.0–0.2)

## 2023-01-16 LAB — RAPID URINE DRUG SCREEN, HOSP PERFORMED
Amphetamines: NOT DETECTED
Barbiturates: NOT DETECTED
Benzodiazepines: NOT DETECTED
Cocaine: POSITIVE — AB
Opiates: NOT DETECTED
Tetrahydrocannabinol: NOT DETECTED

## 2023-01-16 LAB — LIPASE, BLOOD: Lipase: 24 U/L (ref 11–51)

## 2023-01-16 LAB — COMPREHENSIVE METABOLIC PANEL
ALT: 56 U/L — ABNORMAL HIGH (ref 0–44)
AST: 222 U/L — ABNORMAL HIGH (ref 15–41)
Albumin: 3.3 g/dL — ABNORMAL LOW (ref 3.5–5.0)
Alkaline Phosphatase: 91 U/L (ref 38–126)
Anion gap: 9 (ref 5–15)
BUN: 12 mg/dL (ref 6–20)
CO2: 21 mmol/L — ABNORMAL LOW (ref 22–32)
Calcium: 7.8 mg/dL — ABNORMAL LOW (ref 8.9–10.3)
Chloride: 109 mmol/L (ref 98–111)
Creatinine, Ser: 1.08 mg/dL (ref 0.61–1.24)
GFR, Estimated: >60 mL/min (ref >60)
Glucose, Bld: 99 mg/dL (ref 70–99)
Potassium: 4.3 mmol/L (ref 3.5–5.1)
Sodium: 139 mmol/L (ref 135–145)
Total Bilirubin: 0.4 mg/dL (ref 0.3–1.2)
Total Protein: 6.8 g/dL (ref 6.5–8.1)

## 2023-01-16 LAB — TROPONIN I (HIGH SENSITIVITY)
Troponin I (High Sensitivity): 5 ng/L (ref <18)
Troponin I (High Sensitivity): 6 ng/L (ref <18)

## 2023-01-16 MED ORDER — MORPHINE SULFATE (PF) 4 MG/ML IV SOLN
4.0000 mg | Freq: Once | INTRAVENOUS | Status: AC
  Administered 2023-01-16: 4 mg via INTRAMUSCULAR

## 2023-01-16 MED ORDER — MORPHINE SULFATE (PF) 4 MG/ML IV SOLN
4.0000 mg | Freq: Once | INTRAVENOUS | Status: DC
  Filled 2023-01-16: qty 1

## 2023-01-16 MED ORDER — LIDOCAINE VISCOUS HCL 2 % MT SOLN
15.0000 mL | Freq: Once | OROMUCOSAL | Status: AC
  Administered 2023-01-16: 15 mL via ORAL
  Filled 2023-01-16: qty 15

## 2023-01-16 MED ORDER — ONDANSETRON HCL 4 MG/2ML IJ SOLN
4.0000 mg | Freq: Once | INTRAMUSCULAR | Status: DC
  Filled 2023-01-16: qty 2

## 2023-01-16 MED ORDER — ONDANSETRON 4 MG PO TBDP
8.0000 mg | ORAL_TABLET | Freq: Once | ORAL | Status: AC
  Administered 2023-01-16: 8 mg via ORAL
  Filled 2023-01-16: qty 2

## 2023-01-16 MED ORDER — ONDANSETRON HCL 4 MG/2ML IJ SOLN
4.0000 mg | Freq: Once | INTRAMUSCULAR | Status: AC
  Administered 2023-01-16: 4 mg via INTRAVENOUS
  Filled 2023-01-16: qty 2

## 2023-01-16 MED ORDER — FAMOTIDINE IN NACL 20-0.9 MG/50ML-% IV SOLN
20.0000 mg | Freq: Once | INTRAVENOUS | Status: AC
  Administered 2023-01-16: 20 mg via INTRAVENOUS
  Filled 2023-01-16: qty 50

## 2023-01-16 MED ORDER — PANTOPRAZOLE SODIUM 40 MG IV SOLR
40.0000 mg | Freq: Once | INTRAVENOUS | Status: AC
  Administered 2023-01-16: 40 mg via INTRAVENOUS
  Filled 2023-01-16: qty 10

## 2023-01-16 MED ORDER — MORPHINE SULFATE (PF) 2 MG/ML IV SOLN
2.0000 mg | Freq: Once | INTRAVENOUS | Status: AC
  Administered 2023-01-16: 2 mg via INTRAVENOUS
  Filled 2023-01-16: qty 1

## 2023-01-16 MED ORDER — ALUM & MAG HYDROXIDE-SIMETH 200-200-20 MG/5ML PO SUSP
30.0000 mL | Freq: Once | ORAL | Status: AC
  Administered 2023-01-16: 30 mL via ORAL
  Filled 2023-01-16: qty 30

## 2023-01-16 NOTE — ED Triage Notes (Signed)
Patient BIB EMS for evaluation of chest pain that started tonight.  Reports he recently was admitted for multiple days for same.  Hx of WPW and pancreatitis.  Allergy to ASA.  EMS reports patient took Viagra tonight and was not able to receive nitroglycerin for pain relief.  Pt states he feels like his pancreatitis has returned "because my stool is gray again."  Has had nausea.

## 2023-01-16 NOTE — ED Provider Notes (Signed)
Signout received on this 52 year old male who presented for abdominal pain.  See previous note from [facility] for full details.  He has history of chronic abdominal pain.  Recent admission.  Acute abdominal series without evidence of bowel obstruction.  Having bowel movements which suggest against an acute bowel obstruction at this time.  Lipase was and delta troponin pending at the time of shift change. Physical Exam  BP (!) 150/100   Pulse 75   Temp 98.6 F (37 C) (Oral)   Resp 18   Ht 5\' 8"  (1.727 m)   Wt 55.8 kg   SpO2 100%   BMI 18.70 kg/m     Procedures  Procedures  ED Course / MDM   Clinical Course as of 01/16/23 1013  Mon Jan 16, 2023  0458 No bowel gas pattern on acute abdominal series.  Despite vomiting, patient has continued to have bowel movements.  No peritoneal signs on exam.  Doubt recurrent ileus/pSBO.  No other acute findings in the chest. [KH]  0535 Per chart review, AST historically fluctuates. Has trended in the 100's in the past. Suspected to correlate with ongoing substance use/abuse. Hgb stable at 9.8 compared to prior. No leukocytosis today. [KH]    Clinical Course User Index [KH] Antony Madura, PA-C   Medical Decision Making Amount and/or Complexity of Data Reviewed Labs: ordered. Radiology: ordered.  Risk OTC drugs. Prescription drug management.   Lipase and delta troponin with normal.  P.o. trial given.  He still reports significant abdominal pain.  We discussed CT imaging but patient prefers another round of pain medication and discharge.  Return precautions given.  Discharged in stable condition.       Marita Kansas, PA-C 01/16/23 1014    [place], Adam, DO 01/16/23 1050

## 2023-01-16 NOTE — Discharge Instructions (Signed)
Your workup today was reassuring.  Follow-up with your primary care provider.  For any concerning symptoms return to the emergency room.

## 2023-01-16 NOTE — ED Provider Notes (Signed)
EMERGENCY DEPARTMENT AT [facility] Provider Note   CSN: [id number] Arrival date & time: 01/16/23  4696     History  Chief Complaint  Patient presents with   Chest Pain    [name] is a 52 y.o. male.  52 y/o male with hx of alcohol cocaine and tobacco abuse, PAF (not on anticoagulation due to substance abuse), WPW syndrome s/p ablation, chronic alcohol associated pancreatitis with pseudocyst, HTN, bipolar disorder, asthma/chronic bronchitis, seizure disorder, chronic back pain, and HLD presents to the ED for complaints of chest pain and abdominal pain. He states that symptom onset was round 0200. Questions whether his pSBO has returned because he ate some baked chicken tonight. Was admitted for pSBO from 9/1-01/11/23.  Reports emesis x2; however, has continued to have bowel movements. Took Oxycodone at 1600 and 2200 tonight, but not since his worsening pain at 0200. Does report that pain feels similar to prior exacerbations of pancreatitis. States last ETOH intake was Saturday night.  Also took a Viagra round 2300.  The history is provided by the patient and the EMS personnel. No language interpreter was used.       Home Medications Prior to Admission medications   Medication Sig Start Date End Date Taking? Authorizing Provider  albuterol (VENTOLIN HFA) 108 (90 Base) MCG/ACT inhaler Inhale 2 puffs into the lungs every 4 (four) hours as needed for wheezing or shortness of breath. 07/23/22   Ghimire, Werner Lean, MD  famotidine (PEPCID) 20 MG tablet Take 1 tablet (20 mg total) by mouth 2 (two) times daily. Patient taking differently: Take 20 mg by mouth daily as needed for heartburn. 12/17/22   Fayrene Helper, PA-C  fluticasone (FLONASE) 50 MCG/ACT nasal spray Place 2 sprays into both nostrils in the morning, at noon, and at bedtime.    [provider]  folic acid (FOLVITE) 1 MG tablet Take 1 tablet (1 mg total) by mouth daily. 09/21/22   Pokhrel, Rebekah Chesterfield,  MD  guaiFENesin (ROBITUSSIN) 100 MG/5ML liquid Take 5 mLs by mouth every 4 (four) hours as needed for cough or to loosen phlegm. 01/05/23   Dorcas Carrow, MD  hydrOXYzine (VISTARIL) 25 MG capsule Take 1 capsule (25 mg total) by mouth every 8 (eight) hours as needed for anxiety. 01/11/23   Jerald Kief, MD  lidocaine (XYLOCAINE) 2 % solution Use as directed 15 mLs in the mouth or throat every 6 (six) hours as needed for mouth pain. 01/11/23   Jerald Kief, MD  lipase/protease/amylase (CREON) 36000 UNITS CPEP capsule Take 2 capsules (72,000 Units total) by mouth 3 (three) times daily with meals. May also take 1 capsule (36,000 Units total) as needed (with snacks). 01/11/23   Jerald Kief, MD  metoprolol (TOPROL-XL) 200 MG 24 hr tablet Take 200 mg by mouth daily. 11/30/22   [provider]  Multiple Vitamin (MULTIVITAMIN WITH MINERALS) TABS tablet Take 2 tablets by mouth daily. 01/05/23 02/04/23  Dorcas Carrow, MD  nitroGLYCERIN (NITROSTAT) 0.4 MG SL tablet Place 0.4 mg under the tongue every 5 (five) minutes as needed for chest pain.    [provider]  ondansetron (ZOFRAN-ODT) 4 MG disintegrating tablet Take 1 tablet (4 mg total) by mouth 2 (two) times daily as needed for nausea or vomiting. 09/21/22   Pokhrel, Rebekah Chesterfield, MD  oxyCODONE (ROXICODONE) 5 MG immediate release tablet Take 1 tablet (5 mg total) by mouth every 6 (six) hours as needed for up to 5 days for severe pain  or moderate pain. 01/11/23 01/16/23  Jerald Kief, MD  pantoprazole (PROTONIX) 40 MG tablet Take 1 tablet (40 mg total) by mouth daily. 01/11/23   Jerald Kief, MD  pregabalin (LYRICA) 100 MG capsule Take 1 capsule (100 mg total) by mouth 2 (two) times daily. 11/30/22   Doree Albee, PA-C  sucralfate (CARAFATE) 1 GM/10ML suspension Take 10 mLs (1 g total) by mouth See admin instructions. Take 4 times with meals and at bedtime. 01/11/23   Jerald Kief, MD  amitriptyline (ELAVIL) 25 MG tablet Take 1 tablet (25 mg  total) by mouth at bedtime. Patient not taking: Reported on 08/08/2019 10/15/18 08/08/19  Rolly Salter, MD      Allergies    Robaxin [methocarbamol], Aspirin, Shellfish-derived products, Trazodone and nefazodone, Adhesive [tape], Latex, Toradol [ketorolac tromethamine], Contrast media [iodinated contrast media], Reglan [metoclopramide], and Salmon [fish oil]    Review of Systems   Review of Systems  Constitutional:  Negative for fever.  Cardiovascular:  Positive for chest pain.  Gastrointestinal:  Positive for abdominal pain and vomiting.  Ten systems reviewed and are negative for acute change, except as noted in the HPI.    Physical Exam Updated Vital Signs BP (!) 135/93   Pulse 75   Temp 98.7 F (37.1 C) (Oral)   Resp 15   Ht 5\' 8"  (1.727 m)   Wt 55.8 kg   SpO2 100%   BMI 18.70 kg/m   Physical Exam Vitals and nursing note reviewed.  Constitutional:      General: He is not in acute distress.    Appearance: He is well-developed. He is not diaphoretic.     Comments: Nontoxic appearing and in NAD  HENT:     Head: Normocephalic and atraumatic.  Eyes:     General: No scleral icterus.    Conjunctiva/sclera: Conjunctivae normal.  Cardiovascular:     Rate and Rhythm: Normal rate and regular rhythm.     Pulses: Normal pulses.  Pulmonary:     Effort: Pulmonary effort is normal. No respiratory distress.     Breath sounds: No stridor. No wheezing.     Comments: Respirations even and unlabored Abdominal:     General: There is no distension.     Palpations: There is no mass.     Tenderness: There is abdominal tenderness.     Comments: Generalized abdominal TTP without focality. No peritoneal signs or palpable masses.   Musculoskeletal:        General: Normal range of motion.     Cervical back: Normal range of motion.  Skin:    General: Skin is warm and dry.     Coloration: Skin is not pale.     Findings: No erythema or rash.  Neurological:     Mental Status: He is alert and  oriented to person, place, and time.     Coordination: Coordination normal.  Psychiatric:        Behavior: Behavior normal.     ED Results / Procedures / Treatments   Labs (all labs ordered are listed, but only abnormal results are displayed) Labs Reviewed  CBC WITH DIFFERENTIAL/PLATELET - Abnormal; Notable for the following components:      Result Value   RBC 3.61 (*)    Hemoglobin 9.8 (*)    HCT 32.5 (*)    RDW 17.5 (*)    All other components within normal limits  COMPREHENSIVE METABOLIC PANEL - Abnormal; Notable for the following components:   CO2  21 (*)    Calcium 7.8 (*)    Albumin 3.3 (*)    AST 222 (*)    ALT 56 (*)    All other components within normal limits  RAPID URINE DRUG SCREEN, HOSP PERFORMED  LIPASE, BLOOD  TROPONIN I (HIGH SENSITIVITY)  TROPONIN I (HIGH SENSITIVITY)    EKG EKG Interpretation Date/Time:  Monday January 16 2023 03:46:18 EDT Ventricular Rate:  76 PR Interval:  101 QRS Duration:  87 QT Interval:  371 QTC Calculation: 418 R Axis:   81  Text Interpretation: Sinus rhythm Short PR interval Right atrial enlargement Nonspecific T abnormalities, lateral leads Confirmed by Zadie Rhine ([id number]) on 01/16/2023 4:23:39 AM  Radiology DG Abdomen Acute W/Chest  Result Date: 01/16/2023 CLINICAL DATA:  52 year old male with chest and abdominal pain. EXAM: DG ABDOMEN ACUTE WITH 1 VIEW CHEST COMPARISON:  CT Abdomen and Pelvis 01/09/2023, chest radiographs 01/08/2023, and earlier. FINDINGS: Semi upright AP view of the chest at 0426 hours. Lung volumes and mediastinal contours remain normal. Visualized tracheal air column is within normal limits. EKG leads and wires overlie the chest. Both lungs appear clear. No pneumothorax or pneumoperitoneum. Upright and supine views of the abdomen and pelvis at 0427 hours. No pneumoperitoneum. Non obstructed bowel gas pattern. Aortoiliac calcified atherosclerosis. Dystrophic pancreatic calcifications faintly visible in  the epigastrium. Other abdominal and pelvic visceral contours appear negative. Incidental pelvic phleboliths. No acute osseous abnormality identified. IMPRESSION: 1. No acute cardiopulmonary abnormality. 2.  Normal bowel gas pattern, no free air. 3. Chronic calcific pancreatitis, better demonstrated on recent CT. 4. Aortic Atherosclerosis (ICD10-I70.0). Electronically Signed   By: Odessa Fleming M.D.   On: 01/16/2023 04:52    Procedures Procedures    Medications Ordered in ED Medications  alum & mag hydroxide-simeth (MAALOX/MYLANTA) 200-200-20 MG/5ML suspension 30 mL (30 mLs Oral Given 01/16/23 0549)    And  lidocaine (XYLOCAINE) 2 % viscous mouth solution 15 mL (15 mLs Oral Given 01/16/23 0549)  ondansetron (ZOFRAN) injection 4 mg (4 mg Intravenous Given 01/16/23 0542)  famotidine (PEPCID) IVPB 20 mg premix (0 mg Intravenous Stopped 01/16/23 0617)  pantoprazole (PROTONIX) injection 40 mg (40 mg Intravenous Given 01/16/23 0544)    ED Course/ Medical Decision Making/ A&P Clinical Course as of 01/16/23 0617  Mon Jan 16, 2023  2956 No bowel gas pattern on acute abdominal series.  Despite vomiting, patient has continued to have bowel movements.  No peritoneal signs on exam.  Doubt recurrent ileus/pSBO.  No other acute findings in the chest. [KH]  0535 Per chart review, AST historically fluctuates. Has trended in the 100's in the past. Suspected to correlate with ongoing substance use/abuse. Hgb stable at 9.8 compared to prior. No leukocytosis today. [KH]    Clinical Course User Index [KH] Antony Madura, PA-C                                 Medical Decision Making Amount and/or Complexity of Data Reviewed Labs: ordered. Radiology: ordered.  Risk OTC drugs. Prescription drug management.   This patient presents to the ED for concern of chest pain and abdominal pain, this involves an extensive number of treatment options, and is a complaint that carries with it a high risk of complications and morbidity.   The differential diagnosis includes ACS vs PTX vs PNA vs pleural effusion vs gastritis/PUD vs acute on chronic pancreatitis vs pSBO/SBO   Co morbidities that complicate the  patient evaluation  Polysubstance abuse PAF Chronic pancreatitis Bipolar d/o   Additional history obtained:  Additional history obtained from EMS personnel External records from outside source obtained and reviewed including CT abdomen/pelvis from 01/09/23 questioning pSBO vs ileus. Findings resolved on abdominal Xray later that same day.   Lab Tests:  I Ordered, and personally interpreted labs.  The pertinent results include:  Hgb 9.8 (stable), AST 222, ALT 56   Imaging Studies ordered:  I ordered imaging studies including Acute abdomen w/chest  I independently visualized and interpreted imaging which showed no acute cardiopulmonary abnormalities. No obstructive bowel gas pattern. I agree with the radiologist interpretation   Cardiac Monitoring:  The patient was maintained on a cardiac monitor.  I personally viewed and interpreted the cardiac monitored which showed an underlying rhythm of: NSR   Medicines ordered and prescription drug management:  I ordered medication including GI cocktail, Protonix, Pepcid for pain. Given Zofran for nausea  I have reviewed the patients home medicines and have made adjustments as needed   Test Considered:  Ethanol   Problem List / ED Course:  As above   Reevaluation:  After the interventions noted above, I reevaluated the patient and found that they have :stayed the same   Social Determinants of Health:  Polysubstance abuse   Dispostion:  Care signed out to [place], [place] at shift change pending lipase and delta troponin level.   Narcotics avoided given hx of polysubstance abuse, known chronic abdominal pain. Hx frequent ED utilization.         Final Clinical Impression(s) / ED Diagnoses Final diagnoses:  Chest pain, unspecified type  Chronic  abdominal pain    Rx / DC Orders ED Discharge Orders     None         Antony Madura, PA-C 01/16/23 0617    Zadie Rhine, MD 01/16/23 [phone]

## 2023-01-24 ENCOUNTER — Observation Stay (HOSPITAL_COMMUNITY)
Admission: EM | Admit: 2023-01-24 | Discharge: 2023-01-26 | Disposition: A | Discharge status: Home / Self Care | Payer: MEDICAID | Attending: Internal Medicine | Admitting: Internal Medicine

## 2023-01-24 ENCOUNTER — Emergency Department (HOSPITAL_COMMUNITY): Payer: MEDICAID

## 2023-01-24 ENCOUNTER — Encounter (HOSPITAL_COMMUNITY): Payer: Self-pay | Admitting: Emergency Medicine

## 2023-01-24 ENCOUNTER — Other Ambulatory Visit: Payer: Self-pay

## 2023-01-24 DIAGNOSIS — K560 Paralytic ileus: Secondary | ICD-10-CM | POA: Diagnosis not present

## 2023-01-24 DIAGNOSIS — F12188 Cannabis abuse with other cannabis-induced disorder: Principal | ICD-10-CM | POA: Insufficient documentation

## 2023-01-24 DIAGNOSIS — I10 Essential (primary) hypertension: Secondary | ICD-10-CM | POA: Insufficient documentation

## 2023-01-24 DIAGNOSIS — F17210 Nicotine dependence, cigarettes, uncomplicated: Secondary | ICD-10-CM | POA: Insufficient documentation

## 2023-01-24 DIAGNOSIS — Z91040 Latex allergy status: Secondary | ICD-10-CM | POA: Insufficient documentation

## 2023-01-24 DIAGNOSIS — R1116 Cannabis hyperemesis syndrome: Secondary | ICD-10-CM | POA: Diagnosis present

## 2023-01-24 DIAGNOSIS — R112 Nausea with vomiting, unspecified: Secondary | ICD-10-CM | POA: Diagnosis present

## 2023-01-24 DIAGNOSIS — R109 Unspecified abdominal pain: Secondary | ICD-10-CM | POA: Diagnosis present

## 2023-01-24 DIAGNOSIS — R079 Chest pain, unspecified: Principal | ICD-10-CM

## 2023-01-24 DIAGNOSIS — Z79899 Other long term (current) drug therapy: Secondary | ICD-10-CM | POA: Diagnosis not present

## 2023-01-24 DIAGNOSIS — Z8711 Personal history of peptic ulcer disease: Secondary | ICD-10-CM

## 2023-01-24 NOTE — ED Notes (Signed)
Patient transported to X-ray 

## 2023-01-24 NOTE — ED Triage Notes (Signed)
Patient BIB [facility] for eval of chest pain to the left side along with SHOB, n/v for 30 minutes. States he feels like his pancreatitis is acting up. VSS w/ EMS BP 170/102 HR 100 Spo2- 99% on RA.  Nitroglycerin deferred due to ED drugs and no ASA given due to hx of ulcers.

## 2023-01-25 ENCOUNTER — Emergency Department (HOSPITAL_COMMUNITY): Payer: MEDICAID

## 2023-01-25 ENCOUNTER — Inpatient Hospital Stay: Payer: MEDICAID | Admitting: Critical Care Medicine

## 2023-01-25 DIAGNOSIS — R112 Nausea with vomiting, unspecified: Secondary | ICD-10-CM | POA: Diagnosis not present

## 2023-01-25 DIAGNOSIS — F1290 Cannabis use, unspecified, uncomplicated: Secondary | ICD-10-CM

## 2023-01-25 DIAGNOSIS — K560 Paralytic ileus: Secondary | ICD-10-CM | POA: Diagnosis present

## 2023-01-25 LAB — HEPATIC FUNCTION PANEL
ALT: 48 U/L — ABNORMAL HIGH (ref 0–44)
AST: 39 U/L (ref 15–41)
Albumin: 3.6 g/dL (ref 3.5–5.0)
Alkaline Phosphatase: 95 U/L (ref 38–126)
Bilirubin, Direct: 0.1 mg/dL (ref 0.0–0.2)
Indirect Bilirubin: 0.4 mg/dL (ref 0.3–0.9)
Total Bilirubin: 0.5 mg/dL (ref 0.3–1.2)
Total Protein: 7 g/dL (ref 6.5–8.1)

## 2023-01-25 LAB — CBC
HCT: 27.6 % — ABNORMAL LOW (ref 39.0–52.0)
Hemoglobin: 8.9 g/dL — ABNORMAL LOW (ref 13.0–17.0)
MCH: 26.9 pg (ref 26.0–34.0)
MCHC: 32.2 g/dL (ref 30.0–36.0)
MCV: 83.4 fL (ref 80.0–100.0)
Platelets: 179 10*3/uL (ref 150–400)
RBC: 3.31 MIL/uL — ABNORMAL LOW (ref 4.22–5.81)
RDW: 17.4 % — ABNORMAL HIGH (ref 11.5–15.5)
WBC: 10.4 10*3/uL (ref 4.0–10.5)
nRBC: 0 % (ref 0.0–0.2)

## 2023-01-25 LAB — TROPONIN I (HIGH SENSITIVITY)
Troponin I (High Sensitivity): 6 ng/L (ref <18)
Troponin I (High Sensitivity): 6 ng/L (ref <18)
Troponin I (High Sensitivity): 6 ng/L (ref <18)

## 2023-01-25 LAB — CREATININE, SERUM
Creatinine, Ser: 0.63 mg/dL (ref 0.61–1.24)
GFR, Estimated: >60 mL/min (ref >60)

## 2023-01-25 MED ORDER — ACETAMINOPHEN 10 MG/ML IV SOLN: 1000.0000 mg | Freq: Four times a day (QID) | INTRAVENOUS | Status: DC | PRN

## 2023-01-25 MED ORDER — SUCRALFATE 1 GM/10ML PO SUSP
1.0000 g | Freq: Three times a day (TID) | ORAL | Status: DC
  Administered 2023-01-25 – 2023-01-26 (×3): 1 g via ORAL
  Filled 2023-01-25 (×3): qty 10

## 2023-01-25 MED ORDER — ACETAMINOPHEN 650 MG RE SUPP: 650.0000 mg | Freq: Four times a day (QID) | RECTAL | Status: DC | PRN

## 2023-01-25 MED ORDER — HALOPERIDOL LACTATE 5 MG/ML IJ SOLN
5.0000 mg | Freq: Once | INTRAMUSCULAR | Status: AC
  Administered 2023-01-25: 5 mg via INTRAVENOUS
  Filled 2023-01-25: qty 1

## 2023-01-25 MED ORDER — PANTOPRAZOLE SODIUM 40 MG PO TBEC
40.0000 mg | DELAYED_RELEASE_TABLET | Freq: Every day | ORAL | Status: DC
  Administered 2023-01-25 – 2023-01-26 (×2): 40 mg via ORAL
  Filled 2023-01-25 (×2): qty 1

## 2023-01-25 MED ORDER — ONDANSETRON HCL 4 MG/2ML IJ SOLN
4.0000 mg | Freq: Once | INTRAMUSCULAR | Status: AC
  Administered 2023-01-25: 4 mg via INTRAVENOUS
  Filled 2023-01-25: qty 2

## 2023-01-25 MED ORDER — HYDRALAZINE HCL 20 MG/ML IJ SOLN
5.0000 mg | Freq: Four times a day (QID) | INTRAMUSCULAR | Status: DC | PRN
  Administered 2023-01-25: 5 mg via INTRAVENOUS
  Filled 2023-01-25: qty 1

## 2023-01-25 MED ORDER — HYDROMORPHONE HCL 1 MG/ML IJ SOLN
1.0000 mg | Freq: Once | INTRAMUSCULAR | Status: AC
  Administered 2023-01-25: 1 mg via INTRAVENOUS
  Filled 2023-01-25: qty 1

## 2023-01-25 MED ORDER — ACETAMINOPHEN 325 MG PO TABS: 650.0000 mg | ORAL_TABLET | Freq: Four times a day (QID) | ORAL | Status: DC | PRN

## 2023-01-25 MED ORDER — ACETAMINOPHEN 10 MG/ML IV SOLN
1000.0000 mg | Freq: Four times a day (QID) | INTRAVENOUS | Status: DC | PRN
  Administered 2023-01-26 (×2): 1000 mg via INTRAVENOUS
  Filled 2023-01-25 (×2): qty 100

## 2023-01-25 MED ORDER — ACETAMINOPHEN 325 MG PO TABS
650.0000 mg | ORAL_TABLET | Freq: Four times a day (QID) | ORAL | Status: DC | PRN
  Administered 2023-01-25: 650 mg via ORAL
  Filled 2023-01-25: qty 2

## 2023-01-25 MED ORDER — ALUM & MAG HYDROXIDE-SIMETH 200-200-20 MG/5ML PO SUSP
30.0000 mL | Freq: Once | ORAL | Status: AC
  Administered 2023-01-25: 30 mL via ORAL
  Filled 2023-01-25: qty 30

## 2023-01-25 MED ORDER — PROMETHAZINE HCL 25 MG/ML IJ SOLN: 12.5000 mg | Freq: Four times a day (QID) | INTRAMUSCULAR | Status: DC | PRN

## 2023-01-25 MED ORDER — SODIUM CHLORIDE 0.9 % IV BOLUS
1000.0000 mL | Freq: Once | INTRAVENOUS | Status: AC
  Administered 2023-01-25: 1000 mL via INTRAVENOUS

## 2023-01-25 MED ORDER — ONDANSETRON HCL 4 MG/2ML IJ SOLN
4.0000 mg | Freq: Four times a day (QID) | INTRAMUSCULAR | Status: DC | PRN
  Administered 2023-01-25 – 2023-01-26 (×2): 4 mg via INTRAVENOUS
  Filled 2023-01-25 (×2): qty 2

## 2023-01-25 MED ORDER — ONDANSETRON HCL 4 MG PO TABS: 4.0000 mg | ORAL_TABLET | Freq: Four times a day (QID) | ORAL | Status: DC | PRN

## 2023-01-25 MED ORDER — LACTATED RINGERS IV SOLN: INTRAVENOUS | Status: DC

## 2023-01-25 MED ORDER — PANTOPRAZOLE SODIUM 40 MG IV SOLR
40.0000 mg | Freq: Once | INTRAVENOUS | Status: AC
  Administered 2023-01-25: 40 mg via INTRAVENOUS
  Filled 2023-01-25: qty 10

## 2023-01-25 MED ORDER — METOPROLOL SUCCINATE ER 100 MG PO TB24
200.0000 mg | ORAL_TABLET | Freq: Every day | ORAL | Status: DC
  Administered 2023-01-26: 200 mg via ORAL
  Filled 2023-01-25: qty 2

## 2023-01-25 MED ORDER — ENOXAPARIN SODIUM 40 MG/0.4ML IJ SOSY
40.0000 mg | PREFILLED_SYRINGE | INTRAMUSCULAR | Status: DC
  Administered 2023-01-25: 40 mg via SUBCUTANEOUS
  Filled 2023-01-25: qty 0.4

## 2023-01-25 NOTE — ED Notes (Signed)
Patient is continuing to wander in hallways. He has been told several times to stay at his assigned bed. Patient still refuses to stay in bed and keeps stating "I'm waiting on my sister to bring me something."

## 2023-01-25 NOTE — ED Provider Notes (Signed)
Accepted handoff at shift change from [facility]. Please see prior provider note for more detail.   Briefly: Patient is 52 y.o.   DDX: concern for nausea, vomiting, abdominal pain. Acute on chronic. Hx of pancreatitis, chronic alcohol abuse, marijuana abuse, cocaine abuse.  Plain film x-ray showing adynamic ileus, patient has had some improvement of abdominal pain nausea and 1 very small bowel movement while in the hospital but has had persistent pain for nearly the entirety of the 16 hours has been present in the department.  Plan to admit for ileus, chronic abdominal pain.  Plan: New ST depression, third troponin pending, CT abdomen pending to help elucidate adynamic ileus.  Spoke with the hospitalist, Dr. Natale Milch who will plan for admission.   RISR  EDTHIS    Olene Floss, PA-C 01/25/23 1559    Lonell Grandchild, MD 01/25/23 2100

## 2023-01-25 NOTE — Plan of Care (Signed)
Problem: Education: Goal: Knowledge of General Education information will improve Description: Including pain rating scale, medication(s)/side effects and non-pharmacologic comfort measures Outcome: Progressing   Problem: Health Behavior/Discharge Planning: Goal: Ability to manage health-related needs will improve Outcome: Progressing   Problem: Clinical Measurements: Goal: Will remain free from infection Outcome: Progressing   Problem: Activity: Goal: Risk for activity intolerance will decrease Outcome: Progressing   Problem: Nutrition: Goal: Adequate nutrition will be maintained Outcome: Progressing   Problem: Coping: Goal: Level of anxiety will decrease Outcome: Progressing   Problem: Elimination: Goal: Will not experience complications related to bowel motility Outcome: Progressing Goal: Will not experience complications related to urinary retention Outcome: Progressing   Problem: Pain Managment: Goal: General experience of comfort will improve Outcome: Progressing   Problem: Safety: Goal: Ability to remain free from injury will improve Outcome: Progressing   Problem: Skin Integrity: Goal: Risk for impaired skin integrity will decrease Outcome: Progressing

## 2023-01-25 NOTE — ED Notes (Signed)
Patient transported to CT 

## 2023-01-25 NOTE — ED Notes (Signed)
ED TO INPATIENT HANDOFF REPORT  ED Nurse Name and Phone #: [phone]  S Name/Age/Gender [name] 52 y.o. male Room/Bed: H018C/H018C  Code Status   Code Status: Full Code  Home/SNF/Other Home Patient oriented to: self, place, time, and situation Is this baseline? Yes   Triage Complete: Triage complete  Chief Complaint Adynamic ileus (HCC) [K56.0]  Triage Note Patient BIB [facility] for eval of chest pain to the left side along with SHOB, n/v for 30 minutes. States he feels like his pancreatitis is acting up. VSS w/ EMS BP 170/102 HR 100 Spo2- 99% on RA.  Nitroglycerin deferred due to ED drugs and no ASA given due to hx of ulcers.    Allergies Allergies  Allergen Reactions   Robaxin [Methocarbamol] Other (See Comments)    jumpy limbs   Aspirin Other (See Comments)    Unknown reaction   Shellfish-Derived Products Nausea And Vomiting and Rash   Trazodone And Nefazodone Other (See Comments)    Muscle spasms   Adhesive [Tape] Itching   Latex Itching   Toradol [Ketorolac Tromethamine] Other (See Comments)    Has ulcers; cannot have this   Contrast Media [Iodinated Contrast Media] Hives   Reglan [Metoclopramide] Other (See Comments)    Muscle spasms   Salmon [Fish Oil] Nausea And Vomiting and Rash    Level of Care/Admitting Diagnosis ED Disposition     ED Disposition  Admit   Condition  --   Comment  Hospital Area: [facility] [[id number]]  Level of Care: Med-Surg [16]  May place patient in observation at [facility] or [facility] if equivalent level of care is available:: Yes  Covid Evaluation: Confirmed COVID Negative  Diagnosis: Adynamic ileus [facility]) [[id number]]  Admitting Physician: Azucena Fallen [[id number]]  Attending Physician: Azucena Fallen [[id number]]          B Medical/Surgery History Past Medical History:  Diagnosis Date   Alcoholism /alcohol abuse    Anemia    Anxiety    Arthritis    knees; arms; elbows  (03/26/2015)   Asthma    Bipolar disorder (HCC)    Chronic bronchitis (HCC)    Chronic lower back pain    Chronic pancreatitis (HCC)    Cocaine abuse (HCC)    Depression    Family history of adverse reaction to anesthesia    Femoral condyle fracture (HCC) 03/08/2014   left medial/notes 03/09/2014   GERD (gastroesophageal reflux disease)    H/O hiatal hernia    H/O suicide attempt 10/2012   High cholesterol    History of blood transfusion 10/2012   when I tried to commit suicide   History of stomach ulcers    Hypertension    Marijuana abuse, continuous    Migraine    a few times/year (03/26/2015)   Pneumonia 1990's X 3   PTSD (post-traumatic stress disorder)    Seizures (HCC)    Sickle cell trait (HCC)    WPW (Wolff-Parkinson-White syndrome)    [name] 03/06/2013   Past Surgical History:  Procedure Laterality Date   BIOPSY  11/25/2017   Procedure: BIOPSY;  Surgeon: [name] Modena, MD;  Location: [facility] ENDOSCOPY;  Service: Endoscopy;;   BIOPSY  10/14/2018   Procedure: BIOPSY;  Surgeon: [name] Modena, MD;  Location: [facility] ENDOSCOPY;  Service: Endoscopy;;   CARDIAC CATHETERIZATION     CYST ENTEROSTOMY  01/02/2020   Procedure: CYST ASPIRATION;  Surgeon: Rachael Fee, MD;  Location: [facility] ENDOSCOPY;  Service: Endoscopy;;  ESOPHAGOGASTRODUODENOSCOPY (EGD) WITH PROPOFOL N/A 11/25/2017   Procedure: ESOPHAGOGASTRODUODENOSCOPY (EGD) WITH PROPOFOL;  Surgeon: [name] Modena, MD;  Location[facility] ENDOSCOPY;  Service: Endoscopy;  Laterality: N/A;   ESOPHAGOGASTRODUODENOSCOPY (EGD) WITH PROPOFOL Left 10/14/2018   Procedure: ESOPHAGOGASTRODUODENOSCOPY (EGD) WITH PROPOFOL;  Surgeon: [name] Modena, MD;  Location: [facility] ENDOSCOPY;  Service: Endoscopy;  Laterality: Left;   ESOPHAGOGASTRODUODENOSCOPY (EGD) WITH PROPOFOL N/A 11/14/2018   Procedure: ESOPHAGOGASTRODUODENOSCOPY (EGD) WITH PROPOFOL;  Surgeon: Carman Ching, MD;  Location: [facility] ENDOSCOPY;  Service: Gastroenterology;  Laterality: N/A;    ESOPHAGOGASTRODUODENOSCOPY (EGD) WITH PROPOFOL N/A 01/02/2020   Procedure: ESOPHAGOGASTRODUODENOSCOPY (EGD) WITH PROPOFOL;  Surgeon: Rachael Fee, MD;  Location: [facility] ENDOSCOPY;  Service: Endoscopy;  Laterality: N/A;   ESOPHAGOGASTRODUODENOSCOPY (EGD) WITH PROPOFOL N/A 10/25/2020   Procedure: ESOPHAGOGASTRODUODENOSCOPY (EGD) WITH PROPOFOL;  Surgeon: Meridee Score Netty Starring., MD;  Location[facility] ENDOSCOPY;  Service: Gastroenterology;  Laterality: N/A;   EUS N/A 01/02/2020   Procedure: UPPER ENDOSCOPIC ULTRASOUND (EUS) RADIAL;  Surgeon: Rachael Fee, MD;  Location: [facility] ENDOSCOPY;  Service: Endoscopy;  Laterality: N/A;   EYE SURGERY Left 1990's   "result of trauma"    FACIAL FRACTURE SURGERY Left 1990's   "result of trauma"    FLEXIBLE SIGMOIDOSCOPY N/A 10/25/2020   Procedure: FLEXIBLE SIGMOIDOSCOPY;  Surgeon: Lemar Lofty., MD;  Location: [facility] ENDOSCOPY;  Service: Gastroenterology;  Laterality: N/A;   FRACTURE SURGERY     HEMOSTASIS CLIP PLACEMENT  10/25/2020   Procedure: HEMOSTASIS CLIP PLACEMENT;  Surgeon: Lemar Lofty., MD;  Location: [facility] ENDOSCOPY;  Service: Gastroenterology;;   HERNIA REPAIR     LEFT HEART CATHETERIZATION WITH CORONARY ANGIOGRAM Right 03/07/2013   Procedure: LEFT HEART CATHETERIZATION WITH CORONARY ANGIOGRAM;  Surgeon: Ricki Rodriguez, MD;  Location: [facility] CATH LAB;  Service: Cardiovascular;  Laterality: Right;   UMBILICAL HERNIA REPAIR     UPPER GASTROINTESTINAL ENDOSCOPY       A IV Location/Drains/Wounds Patient Lines/Drains/Airways Status     Active Line/Drains/Airways     Name Placement date Placement time Site Days   Peripheral IV 01/25/23 20 G Anterior;Right Hand 01/25/23  1213  Hand  less than 1            Intake/Output Last 24 hours No intake or output data in the 24 hours ending 01/25/23 2002  Labs/Imaging Results for orders placed or performed during the hospital encounter of 01/24/23 (from the past 48 hour(s))  Basic metabolic panel      Status: Abnormal   Collection Time: 01/24/23 11:41 PM  Result Value Ref Range   Sodium 139 135 - 145 mmol/L   Potassium 3.6 3.5 - 5.1 mmol/L   Chloride 109 98 - 111 mmol/L   CO2 16 (L) 22 - 32 mmol/L   Glucose, Bld 95 70 - 99 mg/dL    Comment: Glucose reference range applies only to samples taken after fasting for at least 8 hours.   BUN 14 6 - 20 mg/dL   Creatinine, Ser 1.61 0.61 - 1.24 mg/dL   Calcium 8.6 (L) 8.9 - 10.3 mg/dL   GFR, Estimated >09 >60 mL/min    Comment: (NOTE) Calculated using the CKD-EPI Creatinine Equation (2021)    Anion gap 14 5 - 15    Comment: Performed at [facility], [address], [place], [place] [address]  CBC     Status: Abnormal   Collection Time: 01/24/23 11:41 PM  Result Value Ref Range   WBC 8.1 4.0 - 10.5 K/uL   RBC 3.47 (L) 4.22 -  5.81 MIL/uL   Hemoglobin 9.5 (L) 13.0 - 17.0 g/dL   HCT 74.2 (L) 59.5 - 63.8 %   MCV 85.6 80.0 - 100.0 fL   MCH 27.4 26.0 - 34.0 pg   MCHC 32.0 30.0 - 36.0 g/dL   RDW 75.6 (H) 43.3 - 29.5 %   Platelets 200 150 - 400 K/uL   nRBC 0.0 0.0 - 0.2 %    Comment: Performed at [facility], [address] [place], [place] [address]  Troponin I (High Sensitivity)     Status: None   Collection Time: 01/24/23 11:41 PM  Result Value Ref Range   Troponin I (High Sensitivity) 6 <18 ng/L    Comment: (NOTE) Elevated high sensitivity troponin I (hsTnI) values and significant  changes across serial measurements may suggest ACS but many other  chronic and acute conditions are known to elevate hsTnI results.  Refer to the "Links" section for chest pain algorithms and additional  guidance. Performed at [facility], [address] [place], [place] [address]   Lipase, blood     Status: None   Collection Time: 01/24/23 11:41 PM  Result Value Ref Range   Lipase 25 11 - 51 U/L    Comment: Performed at [facility], [address] [place], [place] [address]  Troponin I (High Sensitivity)     Status:  None   Collection Time: 01/25/23  1:00 AM  Result Value Ref Range   Troponin I (High Sensitivity) 6 <18 ng/L    Comment: (NOTE) Elevated high sensitivity troponin I (hsTnI) values and significant  changes across serial measurements may suggest ACS but many other  chronic and acute conditions are known to elevate hsTnI results.  Refer to the "Links" section for chest pain algorithms and additional  guidance. Performed at [facility], [address] [place], [place] [address]   Troponin I (High Sensitivity)     Status: None   Collection Time: 01/25/23  1:08 PM  Result Value Ref Range   Troponin I (High Sensitivity) 6 <18 ng/L    Comment: (NOTE) Elevated high sensitivity troponin I (hsTnI) values and significant  changes across serial measurements may suggest ACS but many other  chronic and acute conditions are known to elevate hsTnI results.  Refer to the "Links" section for chest pain algorithms and additional  guidance. Performed at [facility], [address], [place], [place] [address]    CT ABDOMEN PELVIS WO CONTRAST  Result Date: 01/25/2023 CLINICAL DATA:  Left chest and abdominal pain associated with shortness of breath, nausea, and vomiting EXAM: CT ABDOMEN AND PELVIS WITHOUT CONTRAST TECHNIQUE: Multidetector CT imaging of the abdomen and pelvis was performed following the standard protocol without IV contrast. RADIATION DOSE REDUCTION: This exam was performed according to the departmental dose-optimization program which includes automated exposure control, adjustment of the mA and/or kV according to patient size and/or use of iterative reconstruction technique. COMPARISON:  CT abdomen and pelvis dated 01/09/2023 FINDINGS: Lower chest: No focal consolidation or pulmonary nodule in the lung bases. No pleural effusion or pneumothorax demonstrated. Partially imaged heart size is normal. Hepatobiliary: No focal hepatic lesions. No intra or extrahepatic biliary ductal  dilation. Normal gallbladder. Pancreas: Sequela of chronic pancreatitis with coarse calcifications. 3.8 x 3.5 cm hypoattenuation within the pancreatic body (3:24) and an adjacent smaller hypodensity measuring 0.9 cm (3:25), not substantially changed from 01/09/2023. A cyst at the pancreatic tail measures 1.0 cm (3:24). Spleen:  Normal in size without focal abnormality. Adrenals/Urinary Tract: No adrenal nodules. No suspicious renal mass, calculi or hydronephrosis. No focal bladder wall thickening. Stomach/Bowel: Underdistended stomach demonstrates similar mural thickening along the greater curvature. No evidence of bowel wall thickening, distention, or inflammatory changes. Normal appendix. Vascular/Lymphatic: Aortic atherosclerosis. No enlarged abdominal or pelvic lymph nodes. Reproductive: Prostate is unremarkable. Other: No free fluid, fluid collection, or free air. Musculoskeletal: No acute or abnormal lytic or blastic osseous lesions. Degenerative changes at L5-S1. IMPRESSION: 1. No acute abnormality in the abdomen or pelvis. 2. Similar mural thickening along the greater curvature of the stomach, which may be due to underdistention or gastritis. 3. Sequela of chronic pancreatitis with similar hypoattenuating lesions in the pancreatic body and tail, which may represent pseudocysts. 4.  Aortic Atherosclerosis (ICD10-I70.0). Electronically Signed   By: Agustin Cree M.D.   On: 01/25/2023 16:24   DG Abdomen 1 View  Result Date: 01/25/2023 CLINICAL DATA:  Abdominal pain.  History of ileus. EXAM: ABDOMEN - 1 VIEW COMPARISON:  01/16/2023. FINDINGS: There is increased amount of gas throughout the small bowel and colon, likely manifestation of adynamic ileus. Consider radiographic follow up if clinically indicated. No evidence of pneumoperitoneum, within the limitations of a supine film. No acute osseous abnormalities. The soft tissues are within normal limits. Surgical changes, devices, tubes and lines: None.  IMPRESSION: 1. Increased amount of gas throughout the small bowel and colon, likely manifestation of adynamic ileus. Electronically Signed   By: Jules Schick M.D.   On: 01/25/2023 14:49   DG Chest 2 View  Result Date: 01/24/2023 CLINICAL DATA:  Chest pain and shortness of breath. Nausea and vomiting. EXAM: CHEST - 2 VIEW COMPARISON:  Chest from abdominal series 01/16/2023 FINDINGS: The cardiomediastinal contours are normal. Mild chronic bronchial thickening. Pulmonary vasculature is normal. No consolidation, pleural effusion, or pneumothorax. No acute osseous abnormalities are seen. IMPRESSION: No active cardiopulmonary disease. Electronically Signed   By: Narda Rutherford M.D.   On: 01/24/2023 23:21    Pending Labs Unresulted Labs (From admission, onward)     Start     Ordered   02/01/23 0500  Creatinine, serum  (enoxaparin (LOVENOX)    CrCl >/= 30 ml/min)  Weekly,   R     Comments: while on enoxaparin therapy    01/25/23 1550   01/26/23 0500  Comprehensive metabolic panel  Daily,   R      01/25/23 1550   01/26/23 0500  CBC  Daily,   R      01/25/23 1550   01/26/23 0500  Lipase, blood  Daily,   R      01/25/23 1550   01/25/23 1546  CBC  (enoxaparin (LOVENOX)    CrCl >/= 30 ml/min)  Once,   R       Comments: Baseline for enoxaparin therapy IF NOT ALREADY DRAWN.  Notify MD if PLT < 100 K.    01/25/23 1550   01/25/23 1546  Creatinine, serum  (enoxaparin (LOVENOX)    CrCl >/= 30 ml/min)  Once,   R       Comments: Baseline for enoxaparin therapy IF NOT ALREADY DRAWN.    01/25/23 1550   01/25/23 1119  Hepatic function panel  Add-on,   AD        01/25/23 1119            Vitals/Pain Today's Vitals   01/25/23 0928 01/25/23 1300 01/25/23 1337 01/25/23 1800  BP: (!) 156/105 (!) 147/103  Pulse: 89 (!) 111    Resp: 17 20    Temp: 97.9 F (36.6 C)  98 F (36.7 C) 98.3 F (36.8 C)  TempSrc: Oral  Oral Oral  SpO2: 100% 100%    Weight:      Height:      PainSc:         Isolation Precautions No active isolations  Medications Medications  enoxaparin (LOVENOX) injection 40 mg (has no administration in time range)  lactated ringers infusion (has no administration in time range)  ondansetron (ZOFRAN) tablet 4 mg (has no administration in time range)    Or  ondansetron (ZOFRAN) injection 4 mg (has no administration in time range)  promethazine (PHENERGAN) injection 12.5 mg (has no administration in time range)  acetaminophen (OFIRMEV) IV 1,000 mg (has no administration in time range)  acetaminophen (TYLENOL) tablet 650 mg (has no administration in time range)    Or  acetaminophen (TYLENOL) suppository 650 mg (has no administration in time range)  metoprolol succinate (TOPROL-XL) 24 hr tablet 200 mg (has no administration in time range)  hydrALAZINE (APRESOLINE) injection 5 mg (has no administration in time range)  pantoprazole (PROTONIX) EC tablet 40 mg (has no administration in time range)  sucralfate (CARAFATE) 1 GM/10ML suspension 1 g (has no administration in time range)  sodium chloride 0.9 % bolus 1,000 mL (0 mLs Intravenous Stopped 01/25/23 1415)  pantoprazole (PROTONIX) injection 40 mg (40 mg Intravenous Given 01/25/23 1301)  alum & mag hydroxide-simeth (MAALOX/MYLANTA) 200-200-20 MG/5ML suspension 30 mL (30 mLs Oral Given 01/25/23 1256)  ondansetron (ZOFRAN) injection 4 mg (4 mg Intravenous Given 01/25/23 1304)  haloperidol lactate (HALDOL) injection 5 mg (5 mg Intravenous Given 01/25/23 1305)  HYDROmorphone (DILAUDID) injection 1 mg (1 mg Intravenous Given 01/25/23 1511)    Mobility walks     Focused Assessments Cardiac Assessment Handoff:    Lab Results  Component Value Date   CKTOTAL 1,037 (H) 07/22/2022   CKMB 3.0 10/03/2011   TROPONINI <0.03 10/13/2018   Lab Results  Component Value Date   DDIMER 0.54 (H) 12/06/2021   Does the Patient currently have chest pain? No    R Recommendations: See Admitting Provider Note  Report  given to:   Additional Notes:

## 2023-01-25 NOTE — Progress Notes (Deleted)
New Patient Office Visit  Subjective    Patient ID: [name], male    DOB: 1971/01/13  Age: 52 y.o. MRN: [id number]  CC: No chief complaint on file.   HPI [name] presents to establish care Admit date:     01/08/2023  Discharge date: 01/11/23  Discharge Physician: Rickey Barbara    PCP: Pcp, No    Recommendations at discharge:     Follow up with PCP as scheduled at [facility] on 9/18   Discharge Diagnoses: Principal Problem:   Epigastric abdominal pain Active Problems:   Essential hypertension   Chronic pancreatitis (HCC)   Alcohol use disorder   GERD (gastroesophageal reflux disease)   Asthma, chronic   Ileus (HCC)   Resolved Problems:   * No resolved hospital problems. *   Hospital Course: 52 year old with a history of alcohol cocaine and tobacco abuse, PAF not on anticoagulation due to substance abuse, WPW syndrome status post ablation, chronic alcohol associated pancreatitis with pseudocyst, HTN, bipolar disorder, asthma/chronic bronchitis, seizure disorder, chronic back pain, and HLD who was admitted 8/25-8/29 with an acute exacerbation of his chronic pancreatitis due to ongoing alcohol use and returned to the ER 9/1 with complaints of epigastric abdominal pain nausea and vomiting. Lipase was normal at presentation. CT abdomen noted sequelae of chronic pancreatitis with mildly distended fluid-filled loops of small bowel in the pelvis consistent with ileus versus PSBO.    Assessment and Plan: Ileus versus PSBO Small bowel obstruction protocol initiated by General Surgery -follow-up KUB suggests resolution of ileus/no persisting obstruction -advanced to regular diet -[facility] registry reviewed. 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 Continue Creon -Cessation done at bedside in depth   Hypomagnesemia -replaced   HTN Reasonably well-controlled at this time   Polysubstance abuse Counseled on need to abstain from substances of abuse   Paroxysmal atrial fibrillation Rate presently controlled/normal sinus - agree he is an exceptionally poor candidate for anticoagulation   GERD/PUD Continue usual outpatient therapies   Asthma Well compensated presently     Consultants: General Surgery  Outpatient Encounter Medications as of 01/25/2023  Medication Sig   albuterol (VENTOLIN HFA) 108 (90 Base) MCG/ACT inhaler Inhale 2 puffs into the lungs every 4 (four) hours as needed for wheezing or shortness of breath.   famotidine (PEPCID) 20 MG tablet Take 1 tablet (20 mg total) by mouth 2 (two) times daily. (Patient taking differently: Take 20 mg by mouth daily as needed for heartburn.)   fluticasone (FLONASE) 50 MCG/ACT nasal spray Place 2 sprays into both nostrils in the morning, at noon, and at bedtime.   folic acid (FOLVITE) 1 MG tablet Take 1 tablet (1 mg total) by mouth daily.   guaiFENesin (ROBITUSSIN) 100 MG/5ML liquid Take 5 mLs by mouth every 4 (four) hours as needed for cough or to loosen phlegm.   hydrOXYzine (VISTARIL) 25 MG capsule Take 1 capsule (25 mg total) by mouth every 8 (eight) hours as needed for anxiety.   lidocaine (XYLOCAINE) 2 % solution Use as directed 15 mLs in the mouth or throat every 6 (six) hours as needed for mouth pain.   lipase/protease/amylase (CREON) 36000 UNITS CPEP capsule Take 2 capsules (72,000 Units total) by mouth 3 (three) times daily with meals. May also take  1 capsule (36,000 Units total) as needed (with snacks).   metoprolol (TOPROL-XL) 200 MG 24 hr tablet Take 200 mg by mouth daily.   Multiple Vitamin (MULTIVITAMIN WITH MINERALS) TABS tablet Take 2 tablets by mouth daily.   nitroGLYCERIN (NITROSTAT) 0.4 MG SL tablet Place 0.4 mg under the tongue every 5 (five) minutes as  needed for chest pain.   ondansetron (ZOFRAN-ODT) 4 MG disintegrating tablet Take 1 tablet (4 mg total) by mouth 2 (two) times daily as needed for nausea or vomiting.   pantoprazole (PROTONIX) 40 MG tablet Take 1 tablet (40 mg total) by mouth daily.   pregabalin (LYRICA) 100 MG capsule Take 1 capsule (100 mg total) by mouth 2 (two) times daily.   sucralfate (CARAFATE) 1 GM/10ML suspension Take 10 mLs (1 g total) by mouth See admin instructions. Take 4 times with meals and at bedtime.   [DISCONTINUED] amitriptyline (ELAVIL) 25 MG tablet Take 1 tablet (25 mg total) by mouth at bedtime. (Patient not taking: Reported on 08/08/2019)   No facility-administered encounter medications on file as of 01/25/2023.    Past Medical History:  Diagnosis Date   Alcoholism /alcohol abuse    Anemia    Anxiety    Arthritis    knees; arms; elbows (03/26/2015)   Asthma    Bipolar disorder (HCC)    Chronic bronchitis (HCC)    Chronic lower back pain    Chronic pancreatitis (HCC)    Cocaine abuse (HCC)    Depression    Family history of adverse reaction to anesthesia    Femoral condyle fracture (HCC) 03/08/2014   left medial/notes 03/09/2014   GERD (gastroesophageal reflux disease)    H/O hiatal hernia    H/O suicide attempt 10/2012   High cholesterol    History of blood transfusion 10/2012   when I tried to commit suicide   History of stomach ulcers    Hypertension    Marijuana abuse, continuous    Migraine    a few times/year (03/26/2015)   Pneumonia 1990's X 3   PTSD (post-traumatic stress disorder)    Seizures (HCC)    Sickle cell trait (HCC)    WPW (Wolff-Parkinson-White syndrome)    [name] 03/06/2013    Past Surgical History:  Procedure Laterality Date   BIOPSY  11/25/2017   Procedure: BIOPSY;  Surgeon: [name] Modena, MD;  Location: [facility] ENDOSCOPY;  Service: Endoscopy;;   BIOPSY  10/14/2018   Procedure: BIOPSY;  Surgeon: [name] Modena, MD;  Location: [facility] ENDOSCOPY;  Service: Endoscopy;;    CARDIAC CATHETERIZATION     CYST ENTEROSTOMY  01/02/2020   Procedure: CYST ASPIRATION;  Surgeon: Rachael Fee, MD;  Location: [facility] ENDOSCOPY;  Service: Endoscopy;;   ESOPHAGOGASTRODUODENOSCOPY (EGD) WITH PROPOFOL N/A 11/25/2017   Procedure: ESOPHAGOGASTRODUODENOSCOPY (EGD) WITH PROPOFOL;  Surgeon: [name] Modena, MD;  Location: [facility] ENDOSCOPY;  Service: Endoscopy;  Laterality: N/A;   ESOPHAGOGASTRODUODENOSCOPY (EGD) WITH PROPOFOL Left 10/14/2018   Procedure: ESOPHAGOGASTRODUODENOSCOPY (EGD) WITH PROPOFOL;  Surgeon: [name] Modena, MD;  Location: [facility] ENDOSCOPY;  Service: Endoscopy;  Laterality: Left;   ESOPHAGOGASTRODUODENOSCOPY (EGD) WITH PROPOFOL N/A 11/14/2018   Procedure: ESOPHAGOGASTRODUODENOSCOPY (EGD) WITH PROPOFOL;  Surgeon: Carman Ching, MD;  Location: [facility] ENDOSCOPY;  Service: Gastroenterology;  Laterality: N/A;   ESOPHAGOGASTRODUODENOSCOPY (EGD) WITH PROPOFOL N/A 01/02/2020   Procedure: ESOPHAGOGASTRODUODENOSCOPY (EGD) WITH PROPOFOL;  Surgeon: Rachael Fee, MD;  Location: [facility] ENDOSCOPY;  Service: Endoscopy;  Laterality: N/A;   ESOPHAGOGASTRODUODENOSCOPY (EGD) WITH PROPOFOL N/A 10/25/2020   Procedure: ESOPHAGOGASTRODUODENOSCOPY (EGD) WITH PROPOFOL;  Surgeon: Lemar Lofty., MD;  Location: [facility] ENDOSCOPY;  Service: Gastroenterology;  Laterality: N/A;   EUS N/A 01/02/2020   Procedure: UPPER ENDOSCOPIC ULTRASOUND (EUS) RADIAL;  Surgeon: Rachael Fee, MD;  Location: [facility] ENDOSCOPY;  Service: Endoscopy;  Laterality: N/A;   EYE SURGERY Left 1990's   "result of trauma"    FACIAL FRACTURE SURGERY Left 1990's   "result of trauma"    FLEXIBLE SIGMOIDOSCOPY N/A 10/25/2020   Procedure: FLEXIBLE SIGMOIDOSCOPY;  Surgeon: Lemar Lofty., MD;  Location: [facility] ENDOSCOPY;  Service: Gastroenterology;  Laterality: N/A;   FRACTURE SURGERY     HEMOSTASIS CLIP PLACEMENT  10/25/2020   Procedure: HEMOSTASIS CLIP PLACEMENT;  Surgeon: Lemar Lofty., MD;  Location: [facility] ENDOSCOPY;  Service:  Gastroenterology;;   HERNIA REPAIR     LEFT HEART CATHETERIZATION WITH CORONARY ANGIOGRAM Right 03/07/2013   Procedure: LEFT HEART CATHETERIZATION WITH CORONARY ANGIOGRAM;  Surgeon: Ricki Rodriguez, MD;  Location: [facility] CATH LAB;  Service: Cardiovascular;  Laterality: Right;   UMBILICAL HERNIA REPAIR     UPPER GASTROINTESTINAL ENDOSCOPY      Family History  Problem Relation Age of Onset   Hypertension Mother    Cirrhosis Father    Alcoholism Father    Hypertension Father    Melanoma Father    Hypertension Other    Coronary artery disease Other     Social History   Socioeconomic History   Marital status: Single    Spouse name: Not on file   Number of children: Not on file   Years of education: Not on file   Highest education level: Not on file  Occupational History   Occupation: disabled  Tobacco Use   Smoking status: Every Day    Current packs/day: 1.00    Average packs/day: 1 pack/day for 36.0 years (36.0 ttl pk-yrs)    Types: Cigarettes, E-cigarettes   Smokeless tobacco: Never  Vaping Use   Vaping status: Former  Substance and Sexual Activity   Alcohol use: Yes    Alcohol/week: 4.0 standard drinks of alcohol    Types: 4 Cans of beer per week   Drug use: Not Currently    Types: Marijuana, Cocaine   Sexual activity: Yes    Birth control/protection: None  Other Topics Concern   Not on file  Social History Narrative   ** Merged History Encounter **       Social Determinants of Health   Financial Resource Strain: Not on file  Food Insecurity: No Food Insecurity (01/09/2023)   Hunger Vital Sign    Worried About Running Out of Food in the Last Year: Never true    Ran Out of Food in the Last Year: Never true  Transportation Needs: No Transportation Needs (01/09/2023)   PRAPARE - Administrator, Civil Service (Medical): No    Lack of Transportation (Non-Medical): No  Physical Activity: Not on file  Stress: Not on file  Social Connections: Not on file   Intimate Partner Violence: Not At Risk (01/09/2023)   Humiliation, Afraid, Rape, and Kick questionnaire    Fear of Current or Ex-Partner: No    Emotionally Abused: No    Physically Abused: No    Sexually Abused: No    ROS      Objective    There were no vitals taken for this visit.  Physical Exam  {Labs (Optional):23779}    Assessment & Plan:   Problem List Items Addressed This Visit   None   No follow-ups  on file.   Shan Levans, MD

## 2023-01-25 NOTE — H&P (Signed)
History and Physical    [name] GEX:[id number] DOB: 1970/10/17 DOA: 01/24/2023  PCP: Pcp, No   Chief Complaint: Nausea/abdominal pain  HPI: [name] is a 52 y.o. male with medical history significant of acute recurrent alcoholic pancreatitis, alcohol use disorder, cocaine and marijuana use/abuse, hypertension, GERD, bipolar disorder, wolff-parkinson-white syndrome - who presents with subacute nausea/vomiting and abdominal pain. Interestingly denies alcohol use for approximately 2 weeks but admits to recent cocaine/marijuana use in the past few days - concurrent with onset of his symptoms. ED imaging remarkable for adynamic ileus appearance, given inability to take PO safely hospitalist was called for admission.   Review of Systems: As per HPI - intractable nausea, vomiting, and abdominal pain.  Assessment/Plan Principal Problem:   Cannabinoid hyperemesis syndrome Active Problems:   Adynamic ileus (HCC)   Intractable nausea/vomiting Likely cannabinoid hyperemesis syndrome -Given timing of marijuana use and symptoms this is likely cannabinoid hyperemesis syndrome -Pancreatitis unlikely given normal lipase and negative imaging, symptoms minimum -Avoid narcotics - Tylenol PO/PR/IV ordered   Hypertension - Resume metoprolol, as needed hydralazine  GERD -continue PPI, sucralfate  EtOH abuse -no alcohol intake in over 2 weeks, outside the window for CIWA protocol, avoid benzos given above   DVT prophylaxis: Lovenox Code Status: Full Family Communication: None present Status is: Inpatient  Dispo: The patient is from: Home              Anticipated d/c is to: Home              Anticipated d/c date is: 24 to 48 hours              Patient currently not medically stable for discharge given inability to tolerate p.o. safely  Consultants:  None  Procedures:  None   Past Medical History:  Diagnosis Date   Alcoholism /alcohol abuse    Anemia    Anxiety     Arthritis    knees; arms; elbows (03/26/2015)   Asthma    Bipolar disorder (HCC)    Chronic bronchitis (HCC)    Chronic lower back pain    Chronic pancreatitis (HCC)    Cocaine abuse (HCC)    Depression    Family history of adverse reaction to anesthesia    Femoral condyle fracture (HCC) 03/08/2014   left medial/notes 03/09/2014   GERD (gastroesophageal reflux disease)    H/O hiatal hernia    H/O suicide attempt 10/2012   High cholesterol    History of blood transfusion 10/2012   when I tried to commit suicide   History of stomach ulcers    Hypertension    Marijuana abuse, continuous    Migraine    a few times/year (03/26/2015)   Pneumonia 1990's X 3   PTSD (post-traumatic stress disorder)    Seizures (HCC)    Sickle cell trait (HCC)    WPW (Wolff-Parkinson-White syndrome)    [name] 03/06/2013    Past Surgical History:  Procedure Laterality Date   BIOPSY  11/25/2017   Procedure: BIOPSY;  Surgeon: [name] Modena, MD;  Location: [facility] ENDOSCOPY;  Service: Endoscopy;;   BIOPSY  10/14/2018   Procedure: BIOPSY;  Surgeon: [name] Modena, MD;  Location: [facility] ENDOSCOPY;  Service: Endoscopy;;   CARDIAC CATHETERIZATION     CYST ENTEROSTOMY  01/02/2020   Procedure: CYST ASPIRATION;  Surgeon: Rachael Fee, MD;  Location: [facility] ENDOSCOPY;  Service: Endoscopy;;   ESOPHAGOGASTRODUODENOSCOPY (EGD) WITH PROPOFOL N/A 11/25/2017   Procedure: ESOPHAGOGASTRODUODENOSCOPY (EGD) WITH PROPOFOL;  Surgeon:  [name] Modena, MD;  Location: [facility] ENDOSCOPY;  Service: Endoscopy;  Laterality: N/A;   ESOPHAGOGASTRODUODENOSCOPY (EGD) WITH PROPOFOL Left 10/14/2018   Procedure: ESOPHAGOGASTRODUODENOSCOPY (EGD) WITH PROPOFOL;  Surgeon: [name] Modena, MD;  Location: [facility] ENDOSCOPY;  Service: Endoscopy;  Laterality: Left;   ESOPHAGOGASTRODUODENOSCOPY (EGD) WITH PROPOFOL N/A 11/14/2018   Procedure: ESOPHAGOGASTRODUODENOSCOPY (EGD) WITH PROPOFOL;  Surgeon: Carman Ching, MD;  Location: [facility] ENDOSCOPY;  Service:  Gastroenterology;  Laterality: N/A;   ESOPHAGOGASTRODUODENOSCOPY (EGD) WITH PROPOFOL N/A 01/02/2020   Procedure: ESOPHAGOGASTRODUODENOSCOPY (EGD) WITH PROPOFOL;  Surgeon: Rachael Fee, MD;  Location: [facility] ENDOSCOPY;  Service: Endoscopy;  Laterality: N/A;   ESOPHAGOGASTRODUODENOSCOPY (EGD) WITH PROPOFOL N/A 10/25/2020   Procedure: ESOPHAGOGASTRODUODENOSCOPY (EGD) WITH PROPOFOL;  Surgeon: Meridee Score Netty Starring., MD;  Location: [facility] ENDOSCOPY;  Service: Gastroenterology;  Laterality: N/A;   EUS N/A 01/02/2020   Procedure: UPPER ENDOSCOPIC ULTRASOUND (EUS) RADIAL;  Surgeon: Rachael Fee, MD;  Location: [facility] ENDOSCOPY;  Service: Endoscopy;  Laterality: N/A;   EYE SURGERY Left 1990's   "result of trauma"    FACIAL FRACTURE SURGERY Left 1990's   "result of trauma"    FLEXIBLE SIGMOIDOSCOPY N/A 10/25/2020   Procedure: FLEXIBLE SIGMOIDOSCOPY;  Surgeon: Lemar Lofty., MD;  Location[facility] ENDOSCOPY;  Service: Gastroenterology;  Laterality: N/A;   FRACTURE SURGERY     HEMOSTASIS CLIP PLACEMENT  10/25/2020   Procedure: HEMOSTASIS CLIP PLACEMENT;  Surgeon: Lemar Lofty., MD;  Location: [facility] ENDOSCOPY;  Service: Gastroenterology;;   HERNIA REPAIR     LEFT HEART CATHETERIZATION WITH CORONARY ANGIOGRAM Right 03/07/2013   Procedure: LEFT HEART CATHETERIZATION WITH CORONARY ANGIOGRAM;  Surgeon: Ricki Rodriguez, MD;  Location: [facility] CATH LAB;  Service: Cardiovascular;  Laterality: Right;   UMBILICAL HERNIA REPAIR     UPPER GASTROINTESTINAL ENDOSCOPY       reports that he has been smoking cigarettes and e-cigarettes. He has a 36 pack-year smoking history. He has never used smokeless tobacco. He reports current alcohol use of about 4.0 standard drinks of alcohol per week. He reports that he does not currently use drugs after having used the following drugs: Marijuana and Cocaine.  Allergies  Allergen Reactions   Robaxin [Methocarbamol] Other (See Comments)    jumpy limbs   Aspirin Other (See  Comments)    Unknown reaction   Shellfish-Derived Products Nausea And Vomiting and Rash   Trazodone And Nefazodone Other (See Comments)    Muscle spasms   Adhesive [Tape] Itching   Latex Itching   Toradol [Ketorolac Tromethamine] Other (See Comments)    Has ulcers; cannot have this   Contrast Media [Iodinated Contrast Media] Hives   Reglan [Metoclopramide] Other (See Comments)    Muscle spasms   Salmon [Fish Oil] Nausea And Vomiting and Rash    Family History  Problem Relation Age of Onset   Hypertension Mother    Cirrhosis Father    Alcoholism Father    Hypertension Father    Melanoma Father    Hypertension Other    Coronary artery disease Other     Prior to Admission medications   Medication Sig Start Date End Date Taking? Authorizing Provider  albuterol (VENTOLIN HFA) 108 (90 Base) MCG/ACT inhaler Inhale 2 puffs into the lungs every 4 (four) hours as needed for wheezing or shortness of breath. 07/23/22   Ghimire, Werner Lean, MD  famotidine (PEPCID) 20 MG tablet Take 1 tablet (20 mg total) by mouth 2 (two) times daily. Patient taking differently: Take 20 mg by mouth daily as needed for heartburn.  12/17/22   Fayrene Helper, PA-C  fluticasone (FLONASE) 50 MCG/ACT nasal spray Place 2 sprays into both nostrils in the morning, at noon, and at bedtime.    [provider]  folic acid (FOLVITE) 1 MG tablet Take 1 tablet (1 mg total) by mouth daily. 09/21/22   Pokhrel, Rebekah Chesterfield, MD  guaiFENesin (ROBITUSSIN) 100 MG/5ML liquid Take 5 mLs by mouth every 4 (four) hours as needed for cough or to loosen phlegm. 01/05/23   Dorcas Carrow, MD  hydrOXYzine (VISTARIL) 25 MG capsule Take 1 capsule (25 mg total) by mouth every 8 (eight) hours as needed for anxiety. 01/11/23   Jerald Kief, MD  lidocaine (XYLOCAINE) 2 % solution Use as directed 15 mLs in the mouth or throat every 6 (six) hours as needed for mouth pain. 01/11/23   Jerald Kief, MD  lipase/protease/amylase (CREON) 36000 UNITS CPEP  capsule Take 2 capsules (72,000 Units total) by mouth 3 (three) times daily with meals. May also take 1 capsule (36,000 Units total) as needed (with snacks). 01/11/23   Jerald Kief, MD  metoprolol (TOPROL-XL) 200 MG 24 hr tablet Take 200 mg by mouth daily. 11/30/22   [provider]  Multiple Vitamin (MULTIVITAMIN WITH MINERALS) TABS tablet Take 2 tablets by mouth daily. 01/05/23 02/04/23  Dorcas Carrow, MD  nitroGLYCERIN (NITROSTAT) 0.4 MG SL tablet Place 0.4 mg under the tongue every 5 (five) minutes as needed for chest pain.    [provider]  ondansetron (ZOFRAN-ODT) 4 MG disintegrating tablet Take 1 tablet (4 mg total) by mouth 2 (two) times daily as needed for nausea or vomiting. 09/21/22   Pokhrel, Rebekah Chesterfield, MD  pantoprazole (PROTONIX) 40 MG tablet Take 1 tablet (40 mg total) by mouth daily. 01/11/23   Jerald Kief, MD  pregabalin (LYRICA) 100 MG capsule Take 1 capsule (100 mg total) by mouth 2 (two) times daily. 11/30/22   Doree Albee, PA-C  sucralfate (CARAFATE) 1 GM/10ML suspension Take 10 mLs (1 g total) by mouth See admin instructions. Take 4 times with meals and at bedtime. 01/11/23   Jerald Kief, MD  amitriptyline (ELAVIL) 25 MG tablet Take 1 tablet (25 mg total) by mouth at bedtime. Patient not taking: Reported on 08/08/2019 10/15/18 08/08/19  Rolly Salter, MD    Physical Exam: Vitals:   01/25/23 0513 01/25/23 0928 01/25/23 1300 01/25/23 1337  BP: (!) 137/95 (!) 156/105 (!) 147/103   Pulse: 86 89 (!) 111   Resp: 17 17 20    Temp: 98 F (36.7 C) 97.9 F (36.6 C)  98 F (36.7 C)  TempSrc:  Oral  Oral  SpO2: 100% 100% 100%   Weight:      Height:        Constitutional: NAD, calm, comfortable Vitals:   01/25/23 0513 01/25/23 0928 01/25/23 1300 01/25/23 1337  BP: (!) 137/95 (!) 156/105 (!) 147/103   Pulse: 86 89 (!) 111   Resp: 17 17 20    Temp: 98 F (36.7 C) 97.9 F (36.6 C)  98 F (36.7 C)  TempSrc:  Oral  Oral  SpO2: 100% 100% 100%   Weight:       Height:       General:  Pleasantly resting in bed, No acute distress. HEENT:  Normocephalic atraumatic.  Sclerae nonicteric, noninjected.  Extraocular movements intact bilaterally. Neck:  Without mass or deformity.  Trachea is midline. Lungs:  Clear to auscultate bilaterally without rhonchi, wheeze, or rales. Heart:  Regular rate and  rhythm.  Without murmurs, rubs, or gallops. Abdomen:  Soft, nontender, nondistended.  Without guarding or rebound. Extremities: Without cyanosis, clubbing, edema, or obvious deformity. Vascular:  Dorsalis pedis and posterior tibial pulses palpable bilaterally. Skin:  Warm and dry, no erythema, no ulcerations.  Labs on Admission: I have personally reviewed following labs and imaging studies  CBC: Recent Labs  Lab 01/24/23 2341  WBC 8.1  HGB 9.5*  HCT 29.7*  MCV 85.6  PLT 200   Basic Metabolic Panel: Recent Labs  Lab 01/24/23 2341  NA 139  K 3.6  CL 109  CO2 16*  GLUCOSE 95  BUN 14  CREATININE 0.92  CALCIUM 8.6*   GFR: Estimated Creatinine Clearance: 72.2 mL/min (by C-G formula based on SCr of 0.92 mg/dL). Liver Function Tests: No results for input(s): "AST", "ALT", "ALKPHOS", "BILITOT", "PROT", "ALBUMIN" in the last 168 hours. Recent Labs  Lab 01/24/23 2341  LIPASE 25   No results for input(s): "AMMONIA" in the last 168 hours. Coagulation Profile: No results for input(s): "INR", "PROTIME" in the last 168 hours. Cardiac Enzymes: No results for input(s): "CKTOTAL", "CKMB", "CKMBINDEX", "TROPONINI" in the last 168 hours. BNP (last 3 results) No results for input(s): "PROBNP" in the last 8760 hours. HbA1C: No results for input(s): "HGBA1C" in the last 72 hours. CBG: No results for input(s): "GLUCAP" in the last 168 hours. Lipid Profile: No results for input(s): "CHOL", "HDL", "LDLCALC", "TRIG", "CHOLHDL", "LDLDIRECT" in the last 72 hours. Thyroid Function Tests: No results for input(s): "TSH", "T4TOTAL", "FREET4", "T3FREE",  "THYROIDAB" in the last 72 hours. Anemia Panel: No results for input(s): "VITAMINB12", "FOLATE", "FERRITIN", "TIBC", "IRON", "RETICCTPCT" in the last 72 hours. Urine analysis:    Component Value Date/Time   COLORURINE YELLOW 01/08/2023 1843   APPEARANCEUR CLEAR 01/08/2023 1843   LABSPEC 1.014 01/08/2023 1843   PHURINE 5.0 01/08/2023 1843   GLUCOSEU NEGATIVE 01/08/2023 1843   HGBUR NEGATIVE 01/08/2023 1843   HGBUR negative 04/30/2010 1020   BILIRUBINUR NEGATIVE 01/08/2023 1843   BILIRUBINUR negative 09/06/2019 1649   KETONESUR NEGATIVE 01/08/2023 1843   PROTEINUR NEGATIVE 01/08/2023 1843   UROBILINOGEN 0.2 09/06/2019 1649   UROBILINOGEN 0.2 03/15/2015 0508   NITRITE NEGATIVE 01/08/2023 1843   LEUKOCYTESUR NEGATIVE 01/08/2023 1843    Radiological Exams on Admission: CT ABDOMEN PELVIS WO CONTRAST  Result Date: 01/25/2023 CLINICAL DATA:  Left chest and abdominal pain associated with shortness of breath, nausea, and vomiting EXAM: CT ABDOMEN AND PELVIS WITHOUT CONTRAST TECHNIQUE: Multidetector CT imaging of the abdomen and pelvis was performed following the standard protocol without IV contrast. RADIATION DOSE REDUCTION: This exam was performed according to the departmental dose-optimization program which includes automated exposure control, adjustment of the mA and/or kV according to patient size and/or use of iterative reconstruction technique. COMPARISON:  CT abdomen and pelvis dated 01/09/2023 FINDINGS: Lower chest: No focal consolidation or pulmonary nodule in the lung bases. No pleural effusion or pneumothorax demonstrated. Partially imaged heart size is normal. Hepatobiliary: No focal hepatic lesions. No intra or extrahepatic biliary ductal dilation. Normal gallbladder. Pancreas: Sequela of chronic pancreatitis with coarse calcifications. 3.8 x 3.5 cm hypoattenuation within the pancreatic body (3:24) and an adjacent smaller hypodensity measuring 0.9 cm (3:25), not substantially changed  from 01/09/2023. A cyst at the pancreatic tail measures 1.0 cm (3:24). Spleen: Normal in size without focal abnormality. Adrenals/Urinary Tract: No adrenal nodules. No suspicious renal mass, calculi or hydronephrosis. No focal bladder wall thickening. Stomach/Bowel: Underdistended stomach demonstrates similar mural thickening along the greater  curvature. No evidence of bowel wall thickening, distention, or inflammatory changes. Normal appendix. Vascular/Lymphatic: Aortic atherosclerosis. No enlarged abdominal or pelvic lymph nodes. Reproductive: Prostate is unremarkable. Other: No free fluid, fluid collection, or free air. Musculoskeletal: No acute or abnormal lytic or blastic osseous lesions. Degenerative changes at L5-S1. IMPRESSION: 1. No acute abnormality in the abdomen or pelvis. 2. Similar mural thickening along the greater curvature of the stomach, which may be due to underdistention or gastritis. 3. Sequela of chronic pancreatitis with similar hypoattenuating lesions in the pancreatic body and tail, which may represent pseudocysts. 4.  Aortic Atherosclerosis (ICD10-I70.0). Electronically Signed   By: Agustin Cree M.D.   On: 01/25/2023 16:24   DG Abdomen 1 View  Result Date: 01/25/2023 CLINICAL DATA:  Abdominal pain.  History of ileus. EXAM: ABDOMEN - 1 VIEW COMPARISON:  01/16/2023. FINDINGS: There is increased amount of gas throughout the small bowel and colon, likely manifestation of adynamic ileus. Consider radiographic follow up if clinically indicated. No evidence of pneumoperitoneum, within the limitations of a supine film. No acute osseous abnormalities. The soft tissues are within normal limits. Surgical changes, devices, tubes and lines: None. IMPRESSION: 1. Increased amount of gas throughout the small bowel and colon, likely manifestation of adynamic ileus. Electronically Signed   By: Jules Schick M.D.   On: 01/25/2023 14:49   DG Chest 2 View  Result Date: 01/24/2023 CLINICAL DATA:  Chest  pain and shortness of breath. Nausea and vomiting. EXAM: CHEST - 2 VIEW COMPARISON:  Chest from abdominal series 01/16/2023 FINDINGS: The cardiomediastinal contours are normal. Mild chronic bronchial thickening. Pulmonary vasculature is normal. No consolidation, pleural effusion, or pneumothorax. No acute osseous abnormalities are seen. IMPRESSION: No active cardiopulmonary disease. Electronically Signed   By: Narda Rutherford M.D.   On: 01/24/2023 23:21    EKG: Independently reviewed.  Without ST elevations or depressions.   Azucena Fallen DO [facility] For contact please use secure messenger on Epic  If 7PM-7AM, please contact night-coverage located on [email or web address]   01/25/2023, 4:31 PM

## 2023-01-25 NOTE — ED Provider Notes (Signed)
[place] [facility] AT [facility] Provider Note   CSN: [id number] Arrival date & time: 01/24/23  2249     History  Chief Complaint  Patient presents with   Chest Pain    [name] is a 52 y.o. male.  The history is provided by the patient and medical records. No language interpreter was used.  Chest Pain    52 year old male with extensive history of polysubstance use which includes alcohol use, cocaine use and tobacco use, history of chronic pancreatitis, Wolff-Parkinson-White syndrome, pancreatic pseudocyst, GERD, bipolar disorder presenting to the ED with complaints of abdominal pain.  Patient endorsed progressive worsening reoccurring pain to his abdomen that radiates to his back and his chest that started since last night.  Pain is sharp stabbing and crampy, moderate in intensity.  He endorsed feeling nauseous and has vomited twice of nonbloody nonbilious contents.  He also has had some bowel movement.  He endorsed having some subjective fever without chills.  No shortness of breath productive cough no dysuria or hematuria.  He reported his last alcohol use was 2 weeks ago and his last cocaine use was 4 days ago.  Also admits to marijuana use last use was several days ago.  He felt his pain is similar to his prior pancreatitis flare.  He mention he is compliant with his medication but it has not provided adequate relief.  Denies exertional chest pain lightheadedness dizziness or diaphoresis.  Home Medications Prior to Admission medications   Medication Sig Start Date End Date Taking? Authorizing Provider  albuterol (VENTOLIN HFA) 108 (90 Base) MCG/ACT inhaler Inhale 2 puffs into the lungs every 4 (four) hours as needed for wheezing or shortness of breath. 07/23/22   Ghimire, Werner Lean, MD  famotidine (PEPCID) 20 MG tablet Take 1 tablet (20 mg total) by mouth 2 (two) times daily. Patient taking differently: Take 20 mg by mouth daily as needed for  heartburn. 12/17/22   Fayrene Helper, PA-C  fluticasone (FLONASE) 50 MCG/ACT nasal spray Place 2 sprays into both nostrils in the morning, at noon, and at bedtime.    [provider]  folic acid (FOLVITE) 1 MG tablet Take 1 tablet (1 mg total) by mouth daily. 09/21/22   Pokhrel, Rebekah Chesterfield, MD  guaiFENesin (ROBITUSSIN) 100 MG/5ML liquid Take 5 mLs by mouth every 4 (four) hours as needed for cough or to loosen phlegm. 01/05/23   Dorcas Carrow, MD  hydrOXYzine (VISTARIL) 25 MG capsule Take 1 capsule (25 mg total) by mouth every 8 (eight) hours as needed for anxiety. 01/11/23   Jerald Kief, MD  lidocaine (XYLOCAINE) 2 % solution Use as directed 15 mLs in the mouth or throat every 6 (six) hours as needed for mouth pain. 01/11/23   Jerald Kief, MD  lipase/protease/amylase (CREON) 36000 UNITS CPEP capsule Take 2 capsules (72,000 Units total) by mouth 3 (three) times daily with meals. May also take 1 capsule (36,000 Units total) as needed (with snacks). 01/11/23   Jerald Kief, MD  metoprolol (TOPROL-XL) 200 MG 24 hr tablet Take 200 mg by mouth daily. 11/30/22   [provider]  Multiple Vitamin (MULTIVITAMIN WITH MINERALS) TABS tablet Take 2 tablets by mouth daily. 01/05/23 02/04/23  Dorcas Carrow, MD  nitroGLYCERIN (NITROSTAT) 0.4 MG SL tablet Place 0.4 mg under the tongue every 5 (five) minutes as needed for chest pain.    [provider]  ondansetron (ZOFRAN-ODT) 4 MG disintegrating tablet Take 1 tablet (4 mg total)  by mouth 2 (two) times daily as needed for nausea or vomiting. 09/21/22   Pokhrel, Rebekah Chesterfield, MD  pantoprazole (PROTONIX) 40 MG tablet Take 1 tablet (40 mg total) by mouth daily. 01/11/23   Jerald Kief, MD  pregabalin (LYRICA) 100 MG capsule Take 1 capsule (100 mg total) by mouth 2 (two) times daily. 11/30/22   Doree Albee, PA-C  sucralfate (CARAFATE) 1 GM/10ML suspension Take 10 mLs (1 g total) by mouth See admin instructions. Take 4 times with meals and at bedtime.  01/11/23   Jerald Kief, MD  amitriptyline (ELAVIL) 25 MG tablet Take 1 tablet (25 mg total) by mouth at bedtime. Patient not taking: Reported on 08/08/2019 10/15/18 08/08/19  Rolly Salter, MD      Allergies    Robaxin [methocarbamol], Aspirin, Shellfish-derived products, Trazodone and nefazodone, Adhesive [tape], Latex, Toradol [ketorolac tromethamine], Contrast media [iodinated contrast media], Reglan [metoclopramide], and Salmon [fish oil]    Review of Systems   Review of Systems  Cardiovascular:  Positive for chest pain.  All other systems reviewed and are negative.   Physical Exam Updated Vital Signs BP (!) 156/105 (BP Location: Left Arm)   Pulse 89   Temp 97.9 F (36.6 C) (Oral)   Resp 17   Ht 5\' 8"  (1.727 m)   Wt 55 kg   SpO2 100%   BMI 18.44 kg/m  Physical Exam Vitals and nursing note reviewed.  Constitutional:      Appearance: He is well-developed.     Comments: Patient appears uncomfortable, holding his abdomen but nontoxic in appearance  HENT:     Head: Atraumatic.  Eyes:     Conjunctiva/sclera: Conjunctivae normal.  Cardiovascular:     Rate and Rhythm: Normal rate and regular rhythm.     Pulses: Normal pulses.     Heart sounds: Normal heart sounds.  Pulmonary:     Effort: Pulmonary effort is normal.     Breath sounds: Normal breath sounds.  Abdominal:     Palpations: Abdomen is soft.     Tenderness: There is abdominal tenderness (Abdomen is diffusely tender no guarding no rebound tenderness).  Musculoskeletal:     Cervical back: Neck supple.  Skin:    Findings: No rash.  Neurological:     Mental Status: He is alert.     ED Results / Procedures / Treatments   Labs (all labs ordered are listed, but only abnormal results are displayed) Labs Reviewed  BASIC METABOLIC PANEL - Abnormal; Notable for the following components:      Result Value   CO2 16 (*)    Calcium 8.6 (*)    All other components within normal limits  CBC - Abnormal; Notable for the  following components:   RBC 3.47 (*)    Hemoglobin 9.5 (*)    HCT 29.7 (*)    RDW 17.1 (*)    All other components within normal limits  LIPASE, BLOOD  HEPATIC FUNCTION PANEL  TROPONIN I (HIGH SENSITIVITY)  TROPONIN I (HIGH SENSITIVITY)  TROPONIN I (HIGH SENSITIVITY)  TROPONIN I (HIGH SENSITIVITY)    EKG EKG Interpretation Date/Time:  Wednesday January 25 2023 11:04:05 EDT Ventricular Rate:  112 PR Interval:  130 QRS Duration:  80 QT Interval:  320 QTC Calculation: 436 R Axis:   73  Text Interpretation: Sinus tachycardia with occasional ventricular-paced complexes T wave abnormality, consider lateral ischemia When compared with ECG of 24-Jan-2023 22:25, PREVIOUS ECG IS PRESENT COmparde to prior tracing T wave inversions  lateral leads slightly more pronounced Confirmed by Alvester Chou [id number]) on 01/25/2023 11:05:46 AM  Radiology DG Chest 2 View  Result Date: 01/24/2023 CLINICAL DATA:  Chest pain and shortness of breath. Nausea and vomiting. EXAM: CHEST - 2 VIEW COMPARISON:  Chest from abdominal series 01/16/2023 FINDINGS: The cardiomediastinal contours are normal. Mild chronic bronchial thickening. Pulmonary vasculature is normal. No consolidation, pleural effusion, or pneumothorax. No acute osseous abnormalities are seen. IMPRESSION: No active cardiopulmonary disease. Electronically Signed   By: Narda Rutherford M.D.   On: 01/24/2023 23:21    Procedures Procedures    Medications Ordered in ED Medications  HYDROmorphone (DILAUDID) injection 1 mg (has no administration in time range)  sodium chloride 0.9 % bolus 1,000 mL (0 mLs Intravenous Stopped 01/25/23 1415)  pantoprazole (PROTONIX) injection 40 mg (40 mg Intravenous Given 01/25/23 1301)  alum & mag hydroxide-simeth (MAALOX/MYLANTA) 200-200-20 MG/5ML suspension 30 mL (30 mLs Oral Given 01/25/23 1256)  ondansetron (ZOFRAN) injection 4 mg (4 mg Intravenous Given 01/25/23 1304)  haloperidol lactate (HALDOL) injection 5 mg (5  mg Intravenous Given 01/25/23 1305)    ED Course/ Medical Decision Making/ A&P                                 Medical Decision Making Amount and/or Complexity of Data Reviewed Labs: ordered. Radiology: ordered. ECG/medicine tests: ordered.  Risk OTC drugs. Prescription drug management.   BP (!) 156/105 (BP Location: Left Arm)   Pulse 89   Temp 97.9 F (36.6 C) (Oral)   Resp 17   Ht 5\' 8"  (1.727 m)   Wt 55 kg   SpO2 100%   BMI 18.44 kg/m   3:71 AM  52 year old male with extensive history of polysubstance use which includes alcohol use, cocaine use and tobacco use, history of chronic pancreatitis, Wolff-Parkinson-White syndrome, pancreatic pseudocyst, GERD, bipolar disorder presenting to the ED with complaints of abdominal pain.  Patient endorsed progressive worsening reoccurring pain to his abdomen that radiates to his back and his chest that started since last night.  Pain is sharp stabbing and crampy, moderate in intensity.  He endorsed feeling nauseous and has vomited twice of nonbloody nonbilious contents.  He also has had some bowel movement.  He endorsed having some subjective fever without chills.  No shortness of breath productive cough no dysuria or hematuria.  He reported his last alcohol use was 2 weeks ago and his last cocaine use was 4 days ago.  Also admits to marijuana use last use was several days ago.  He felt his pain is similar to his prior pancreatitis flare.  He mention he is compliant with his medication but it has not provided adequate relief.  Denies exertional chest pain lightheadedness dizziness or diaphoresis.  Exam remarkable for tenderness across the abdomen on palpation no guarding no rebound tenderness.  Heart with normal rate and rhythm, lungs clear to auscultation bilaterally.  Vitals are notable for elevated blood pressure 156/105.  Patient is afebrile no hypoxia.  -Labs ordered, independently viewed and interpreted by me.  Labs remarkable for  baseline labs -The patient was maintained on a cardiac monitor.  I personally viewed and interpreted the cardiac monitored which showed an underlying rhythm of: sinus tachycardia -This patient presents to the ED for concern of abd pain, this involves an extensive number of treatment options, and is a complaint that carries with it a high risk of complications and morbidity.  The differential diagnosis includes pancreatitis, gastritis, GERD, PUD, MI, PNA, PTX, cholecystitis, appendicitis, colitis -Co morbidities that complicate the patient evaluation includes pancreatitis, bipolar, polysubstance use, GERD -Treatment includes dilaudid,haldol, zofran, protonix, GI cocktail, IVF -Reevaluation of the patient after these medicines showed that the patient improved -PCP office notes or outside notes reviewed -Discussion with oncoming team who will consult medicine for admission -Escalation to admission/observation considered: patient amenable for admission  2:50 PM Patient here with complaints of abdominal pain that radiates to his chest and back.  Known history of pancreatitis and has been seen in the ED multiple times for same.  He felt his symptoms very similar to prior parotitis.  Today's labs overall reassuring, negative delta troponin, normal lipase, I have reviewed patient's previous CT scan of the abdomen pelvis, he was last seen for the same complaint at that time CT scan shows no acute finding except for pancreatic cyst which is not new.  Patient was hospitalized for period of time for concerns of ileus versus partial small bowel bowel obstruction.  States he had abdominal discomfort and some nausea and occasional vomiting, a KUB was obtained showing increased amount of gas suggesting adynamic ileus.  Chest x-ray overall reassuring, EKG did show some ST depression in the lateral leads which is new.  Patient has had previous cardiac stress test but no prior heart catheterization.  He does use cocaine  which may contribute to his complaint. Will follow up with another troponin.    Pt still endorse moderate pain.  Last bowel movement was 20 minutes ago and last episode of vomit was last night.  However, he has not eat/drink since.          Final Clinical Impression(s) / ED Diagnoses Final diagnoses:  None    Rx / DC Orders ED Discharge Orders     None         Fayrene Helper, PA-C 01/25/23 1525    Terald Sleeper, MD 01/25/23 [phone]

## 2023-01-26 ENCOUNTER — Other Ambulatory Visit (HOSPITAL_COMMUNITY): Payer: Self-pay

## 2023-01-26 DIAGNOSIS — F1290 Cannabis use, unspecified, uncomplicated: Secondary | ICD-10-CM | POA: Diagnosis not present

## 2023-01-26 DIAGNOSIS — Z8711 Personal history of peptic ulcer disease: Secondary | ICD-10-CM | POA: Diagnosis not present

## 2023-01-26 DIAGNOSIS — R079 Chest pain, unspecified: Secondary | ICD-10-CM | POA: Diagnosis not present

## 2023-01-26 DIAGNOSIS — R112 Nausea with vomiting, unspecified: Secondary | ICD-10-CM | POA: Diagnosis not present

## 2023-01-26 LAB — LIPASE, BLOOD: Lipase: 23 U/L (ref 11–51)

## 2023-01-26 LAB — COMPREHENSIVE METABOLIC PANEL
ALT: 50 U/L — ABNORMAL HIGH (ref 0–44)
AST: 65 U/L — ABNORMAL HIGH (ref 15–41)
Albumin: 3.7 g/dL (ref 3.5–5.0)
Alkaline Phosphatase: 92 U/L (ref 38–126)
Anion gap: 11 (ref 5–15)
BUN: 5 mg/dL — ABNORMAL LOW (ref 6–20)
CO2: 18 mmol/L — ABNORMAL LOW (ref 22–32)
Calcium: 8.8 mg/dL — ABNORMAL LOW (ref 8.9–10.3)
Chloride: 107 mmol/L (ref 98–111)
Creatinine, Ser: 0.72 mg/dL (ref 0.61–1.24)
GFR, Estimated: >60 mL/min (ref >60)
Glucose, Bld: 102 mg/dL — ABNORMAL HIGH (ref 70–99)
Potassium: 3.4 mmol/L — ABNORMAL LOW (ref 3.5–5.1)
Sodium: 136 mmol/L (ref 135–145)
Total Bilirubin: 0.8 mg/dL (ref 0.3–1.2)
Total Protein: 7.1 g/dL (ref 6.5–8.1)

## 2023-01-26 LAB — CBC
HCT: 29.1 % — ABNORMAL LOW (ref 39.0–52.0)
Hemoglobin: 9 g/dL — ABNORMAL LOW (ref 13.0–17.0)
MCH: 25.9 pg — ABNORMAL LOW (ref 26.0–34.0)
MCHC: 30.9 g/dL (ref 30.0–36.0)
MCV: 83.6 fL (ref 80.0–100.0)
Platelets: 180 10*3/uL (ref 150–400)
RBC: 3.48 MIL/uL — ABNORMAL LOW (ref 4.22–5.81)
RDW: 17.3 % — ABNORMAL HIGH (ref 11.5–15.5)
WBC: 9.5 10*3/uL (ref 4.0–10.5)
nRBC: 0 % (ref 0.0–0.2)

## 2023-01-26 IMAGING — DX DG CHEST 2V
2 series · 2 of 2 positions shown · non-contrast
Comparison: 03/29/2020

CLINICAL DATA: Left-sided chest pain

EXAM:
CHEST - 2 VIEW

[chest pa]
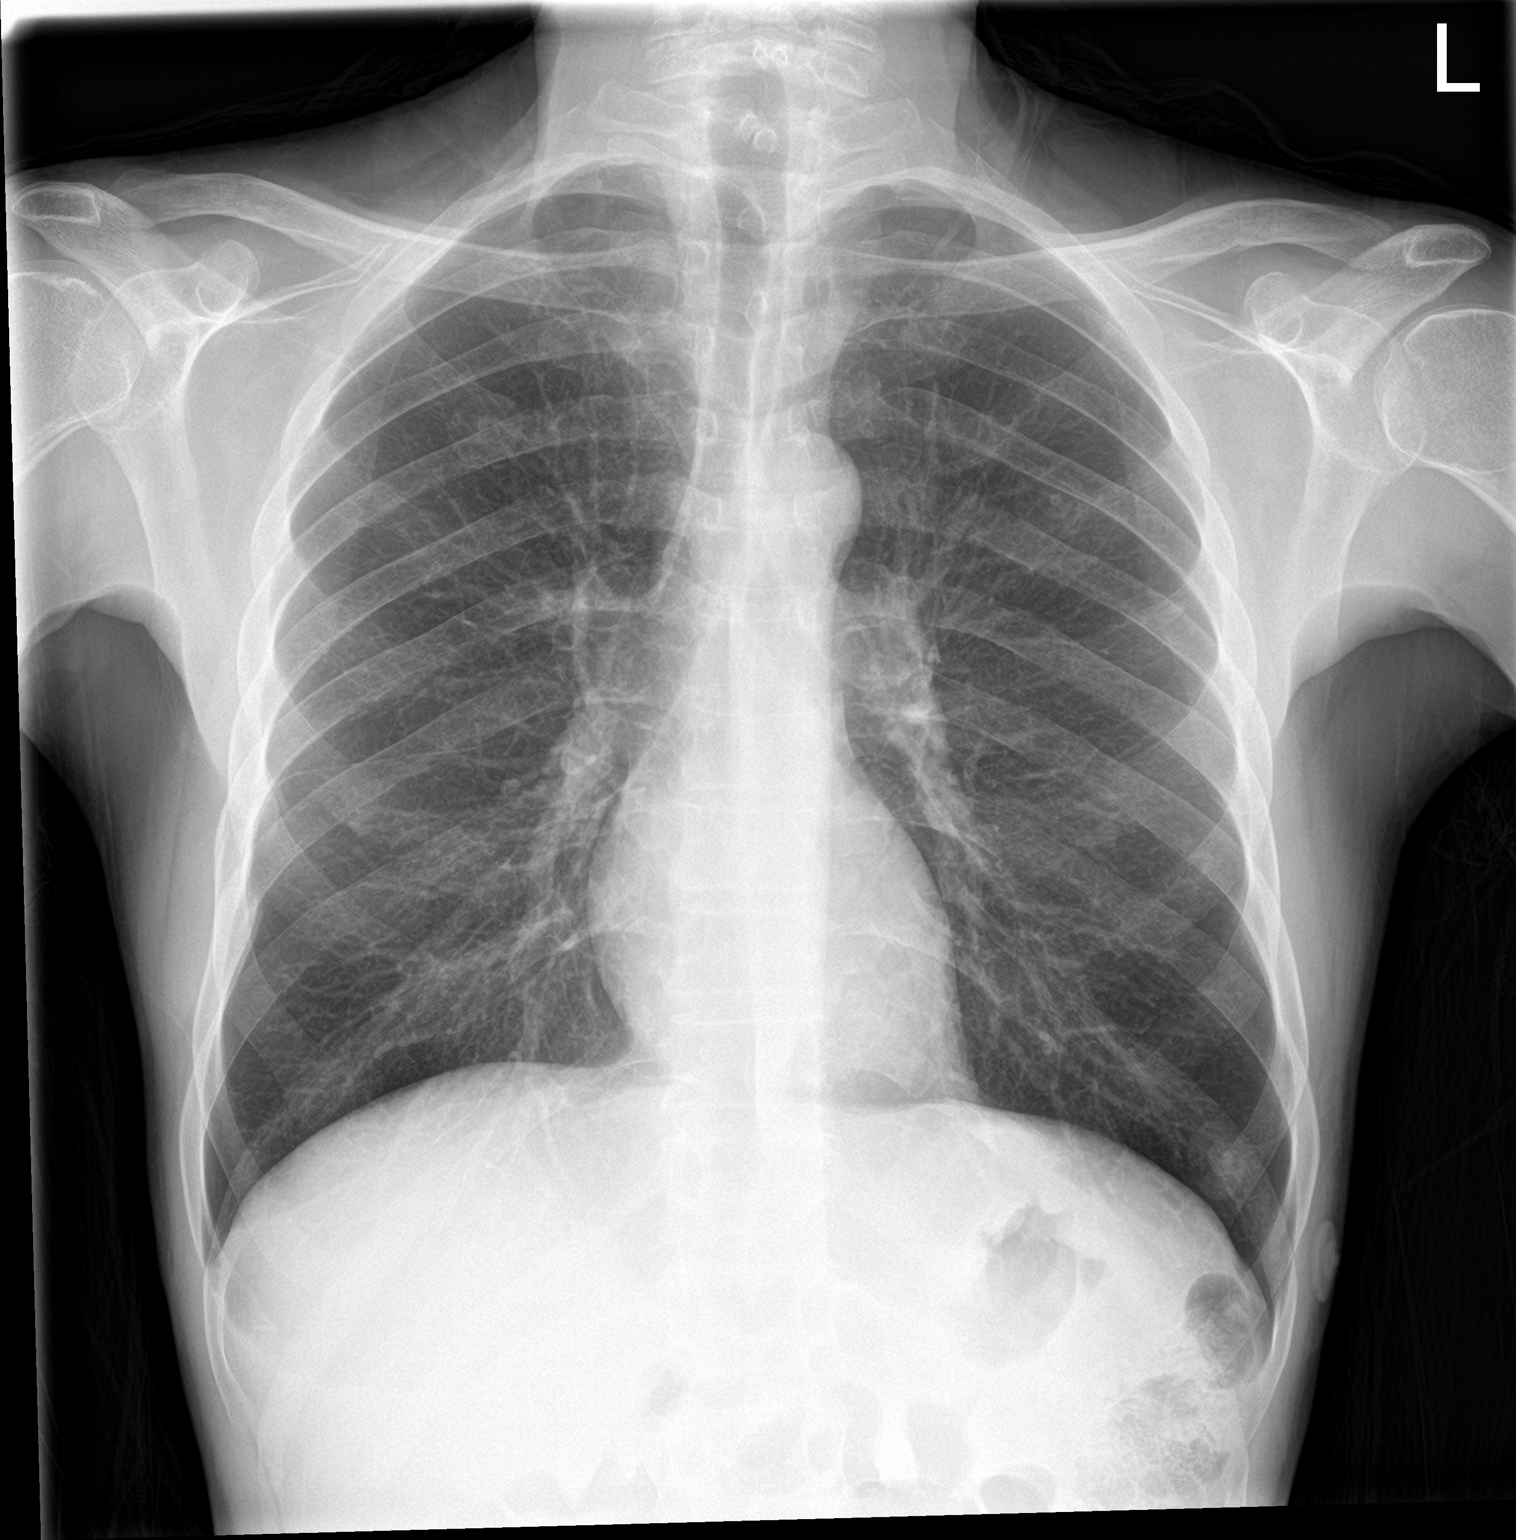

[chest lat]
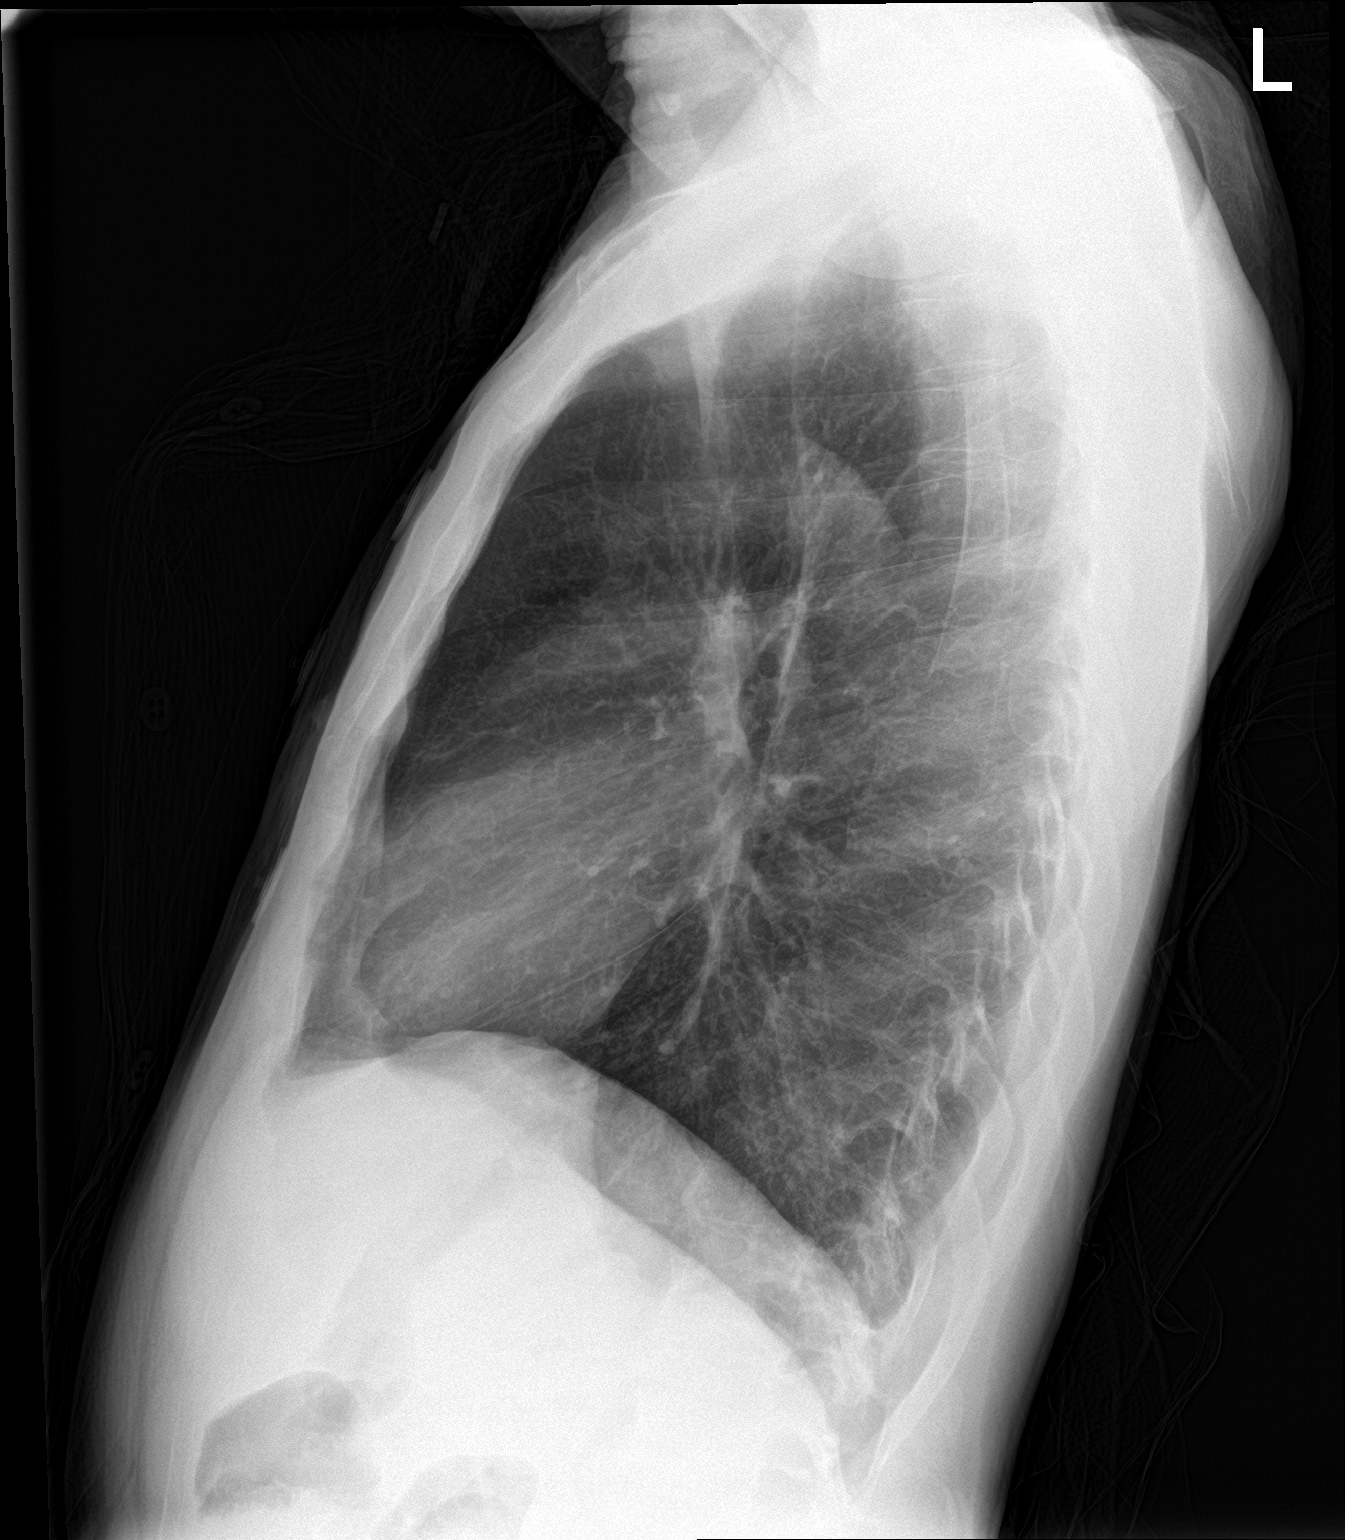

[2 of 2 positions shown; findings below may reference images not displayed]

FINDINGS: Cardiac shadow is within normal limits. Lungs are with well aerated
bilaterally. No focal infiltrate or effusion is seen. No bony
abnormality is noted.
IMPRESSION: No acute abnormality seen.

## 2023-01-26 MED ORDER — LIDOCAINE 5 % EX OINT
1.0000 | TOPICAL_OINTMENT | CUTANEOUS | 0 refills | Status: DC | PRN
  Filled 2023-01-26: qty 30, fill #0

## 2023-01-26 MED ORDER — ONDANSETRON 4 MG PO TBDP
4.0000 mg | ORAL_TABLET | Freq: Two times a day (BID) | ORAL | 0 refills | Status: DC | PRN
  Filled 2023-01-26: qty 30, 15d supply, fill #0

## 2023-01-26 MED ORDER — PANTOPRAZOLE SODIUM 40 MG PO TBEC
40.0000 mg | DELAYED_RELEASE_TABLET | Freq: Every day | ORAL | 0 refills | Status: DC
Start: 2023-01-26 — End: 2023-02-11
  Filled 2023-01-26: qty 30, 30d supply, fill #0

## 2023-01-26 MED ORDER — PANCRELIPASE (LIP-PROT-AMYL) 36000-114000 UNITS PO CPEP
ORAL_CAPSULE | ORAL | 0 refills | Status: DC
  Filled 2023-01-26: qty 240, fill #0

## 2023-01-26 MED ORDER — OXYCODONE HCL 5 MG PO TABS
5.0000 mg | ORAL_TABLET | ORAL | Status: DC | PRN
  Administered 2023-01-26: 5 mg via ORAL
  Filled 2023-01-26: qty 1

## 2023-01-26 MED ORDER — CAPSAICIN 0.025 % EX CREA
TOPICAL_CREAM | Freq: Two times a day (BID) | CUTANEOUS | Status: DC
  Filled 2023-01-26: qty 60

## 2023-01-26 MED ORDER — POTASSIUM CHLORIDE CRYS ER 20 MEQ PO TBCR
60.0000 meq | EXTENDED_RELEASE_TABLET | Freq: Once | ORAL | Status: AC
  Administered 2023-01-26: 60 meq via ORAL
  Filled 2023-01-26: qty 3

## 2023-01-26 MED ORDER — OXYCODONE HCL 5 MG PO TABS
5.0000 mg | ORAL_TABLET | ORAL | 0 refills | Status: DC | PRN
  Filled 2023-01-26: qty 20, 4d supply, fill #0

## 2023-01-26 MED ORDER — ORAL CARE MOUTH RINSE: 15.0000 mL | OROMUCOSAL | Status: DC | PRN

## 2023-01-26 NOTE — Progress Notes (Signed)
Patient requesting to leave AMA. Educated patient on importance of staying, patient declined. MD notified. Charge nurse aware.

## 2023-01-26 NOTE — Discharge Instructions (Signed)
Do NOT dive, operate heavy equipment, or handle delicate objects such as small children if taking sedating meds such as your pain meds

## 2023-01-26 NOTE — Progress Notes (Signed)
Patient decided not to leave ama after speaking with MD

## 2023-01-26 NOTE — Discharge Summary (Signed)
Physician Discharge Summary   Patient: [name] MRN: [id number] DOB: March 20, 1971  Admit date:     01/24/2023  Discharge date: 01/26/23  Discharge Physician: Rickey Barbara   PCP: Pcp, No   Recommendations at discharge:    Follow up with PCP in 1-2 weeks Follow up with [facility] as already scheduled  Discharge Diagnoses: Principal Problem:   Cannabinoid hyperemesis syndrome Active Problems:   Adynamic ileus (HCC)  Resolved Problems:   * No resolved hospital problems. *  Hospital Course: 52 y.o. male with medical history significant of acute recurrent alcoholic pancreatitis, alcohol use disorder, cocaine and marijuana use/abuse, hypertension, GERD, bipolar disorder, wolff-parkinson-white syndrome - who presents with subacute nausea/vomiting and abdominal pain. Interestingly denies alcohol use for approximately 2 weeks but admits to recent cocaine/marijuana use in the past few days - concurrent with onset of his symptoms. ED imaging remarkable for adynamic ileus appearance, given inability to take PO safely hospitalist was called for admission.   Assessment and Plan: Intractable nausea/vomiting Suspected cannabinoid hyperemesis syndrome with chronic pancreatitis -Given timing of marijuana use and symptoms this is likely cannabinoid hyperemesis syndrome in the setting of chronic pancreatitis -Pancreatitis unlikely given normal lipase and negative imaging, symptoms minimum. -resumed previously prescribed oxycodone with good effect. Diet was successfully advanced -Limited quantity of narcotic prescribed for d/c. Pt instructed to reach out to GI to f/u on pain clinic referral    Hypertension - Resumed metoprolol   GERD -continue PPI, sucralfate   EtOH abuse -no alcohol intake in over 2 weeks    Consultants:  Procedures performed:   Disposition: Home Diet recommendation:  Regular diet DISCHARGE MEDICATION: Allergies as of 01/26/2023       Reactions   Robaxin  [methocarbamol] Other (See Comments)   jumpy limbs   Aspirin Other (See Comments)   Unknown reaction   Shellfish-derived Products Nausea And Vomiting, Rash   Trazodone And Nefazodone Other (See Comments)   Muscle spasms   Adhesive [tape] Itching   Latex Itching   Toradol [ketorolac Tromethamine] Other (See Comments)   Has ulcers; cannot have this   Contrast Media [iodinated Contrast Media] Hives   Reglan [metoclopramide] Other (See Comments)   Muscle spasms   Salmon [fish Oil] Nausea And Vomiting, Rash        Medication List     STOP taking these medications    famotidine 20 MG tablet Commonly known as: PEPCID       TAKE these medications    fluticasone 50 MCG/ACT nasal spray Commonly known as: FLONASE Place 2 sprays into both nostrils 3 (three) times daily as needed for allergies.   folic acid 1 MG tablet Commonly known as: FOLVITE Take 1 tablet (1 mg total) by mouth daily.   lidocaine 5 % ointment Commonly known as: XYLOCAINE Apply 1 Application topically as needed for mild pain.   lipase/protease/amylase 52841 UNITS Cpep capsule Commonly known as: Creon Take 2 capsules (72,000 Units total) by mouth 3 (three) times daily with meals. May also take 1 capsule (36,000 Units total) as needed (with snacks).   metoprolol 200 MG 24 hr tablet Commonly known as: TOPROL-XL Take 200 mg by mouth daily.   multivitamin with minerals Tabs tablet Take 2 tablets by mouth daily.   nitroGLYCERIN 0.4 MG SL tablet Commonly known as: NITROSTAT Place 0.4 mg under the tongue every 5 (five) minutes as needed for chest pain.   ondansetron 4 MG disintegrating tablet Commonly known as: ZOFRAN-ODT Take 1 tablet (4 mg  total) by mouth 2 (two) times daily as needed for nausea or vomiting.   oxyCODONE 5 MG immediate release tablet Commonly known as: Oxy IR/ROXICODONE Take 1 tablet (5 mg total) by mouth every 4 (four) hours as needed for severe pain.   pantoprazole 40 MG  tablet Commonly known as: Protonix Take 1 tablet (40 mg total) by mouth daily.   pregabalin 100 MG capsule Commonly known as: Lyrica Take 1 capsule (100 mg total) by mouth 2 (two) times daily.   sucralfate 1 GM/10ML suspension Commonly known as: Carafate Take 10 mLs (1 g total) by mouth See admin instructions. Take 4 times with meals and at bedtime. What changed: when to take this        Follow-up Information     Follow up with your PCP in 1-2 weeks Follow up.          Mansouraty, Netty Starring., MD Follow up.   Specialties: Gastroenterology, Internal Medicine Why: Hospital follow up, as scheduled Contact information: [address] [place] [address] [phone]                Discharge Exam: Ceasar Mons Weights   01/24/23 2257  Weight: 55 kg   General exam: Awake, laying in bed, in nad Respiratory system: Normal respiratory effort, no wheezing Cardiovascular system: regular rate, s1, s2 Gastrointestinal system: Soft, nondistended, positive BS Central nervous system: CN2-12 grossly intact, strength intact Extremities: Perfused, no clubbing Skin: Normal skin turgor, no notable skin lesions seen Psychiatry: Mood normal // no visual hallucinations   Condition at discharge: fair  The results of significant diagnostics from this hospitalization (including imaging, microbiology, ancillary and laboratory) are listed below for reference.   Imaging Studies: CT ABDOMEN PELVIS WO CONTRAST  Result Date: 01/25/2023 CLINICAL DATA:  Left chest and abdominal pain associated with shortness of breath, nausea, and vomiting EXAM: CT ABDOMEN AND PELVIS WITHOUT CONTRAST TECHNIQUE: Multidetector CT imaging of the abdomen and pelvis was performed following the standard protocol without IV contrast. RADIATION DOSE REDUCTION: This exam was performed according to the departmental dose-optimization program which includes automated exposure control, adjustment of the mA and/or kV according  to patient size and/or use of iterative reconstruction technique. COMPARISON:  CT abdomen and pelvis dated 01/09/2023 FINDINGS: Lower chest: No focal consolidation or pulmonary nodule in the lung bases. No pleural effusion or pneumothorax demonstrated. Partially imaged heart size is normal. Hepatobiliary: No focal hepatic lesions. No intra or extrahepatic biliary ductal dilation. Normal gallbladder. Pancreas: Sequela of chronic pancreatitis with coarse calcifications. 3.8 x 3.5 cm hypoattenuation within the pancreatic body (3:24) and an adjacent smaller hypodensity measuring 0.9 cm (3:25), not substantially changed from 01/09/2023. A cyst at the pancreatic tail measures 1.0 cm (3:24). Spleen: Normal in size without focal abnormality. Adrenals/Urinary Tract: No adrenal nodules. No suspicious renal mass, calculi or hydronephrosis. No focal bladder wall thickening. Stomach/Bowel: Underdistended stomach demonstrates similar mural thickening along the greater curvature. No evidence of bowel wall thickening, distention, or inflammatory changes. Normal appendix. Vascular/Lymphatic: Aortic atherosclerosis. No enlarged abdominal or pelvic lymph nodes. Reproductive: Prostate is unremarkable. Other: No free fluid, fluid collection, or free air. Musculoskeletal: No acute or abnormal lytic or blastic osseous lesions. Degenerative changes at L5-S1. IMPRESSION: 1. No acute abnormality in the abdomen or pelvis. 2. Similar mural thickening along the greater curvature of the stomach, which may be due to underdistention or gastritis. 3. Sequela of chronic pancreatitis with similar hypoattenuating lesions in the pancreatic body and tail, which may represent pseudocysts.  4.  Aortic Atherosclerosis (ICD10-I70.0). Electronically Signed   By: Agustin Cree M.D.   On: 01/25/2023 16:24   DG Abdomen 1 View  Result Date: 01/25/2023 CLINICAL DATA:  Abdominal pain.  History of ileus. EXAM: ABDOMEN - 1 VIEW COMPARISON:  01/16/2023. FINDINGS:  There is increased amount of gas throughout the small bowel and colon, likely manifestation of adynamic ileus. Consider radiographic follow up if clinically indicated. No evidence of pneumoperitoneum, within the limitations of a supine film. No acute osseous abnormalities. The soft tissues are within normal limits. Surgical changes, devices, tubes and lines: None. IMPRESSION: 1. Increased amount of gas throughout the small bowel and colon, likely manifestation of adynamic ileus. Electronically Signed   By: Jules Schick M.D.   On: 01/25/2023 14:49   DG Chest 2 View  Result Date: 01/24/2023 CLINICAL DATA:  Chest pain and shortness of breath. Nausea and vomiting. EXAM: CHEST - 2 VIEW COMPARISON:  Chest from abdominal series 01/16/2023 FINDINGS: The cardiomediastinal contours are normal. Mild chronic bronchial thickening. Pulmonary vasculature is normal. No consolidation, pleural effusion, or pneumothorax. No acute osseous abnormalities are seen. IMPRESSION: No active cardiopulmonary disease. Electronically Signed   By: Narda Rutherford M.D.   On: 01/24/2023 23:21   DG Abdomen Acute W/Chest  Result Date: 01/16/2023 CLINICAL DATA:  52 year old male with chest and abdominal pain. EXAM: DG ABDOMEN ACUTE WITH 1 VIEW CHEST COMPARISON:  CT Abdomen and Pelvis 01/09/2023, chest radiographs 01/08/2023, and earlier. FINDINGS: Semi upright AP view of the chest at 0426 hours. Lung volumes and mediastinal contours remain normal. Visualized tracheal air column is within normal limits. EKG leads and wires overlie the chest. Both lungs appear clear. No pneumothorax or pneumoperitoneum. Upright and supine views of the abdomen and pelvis at 0427 hours. No pneumoperitoneum. Non obstructed bowel gas pattern. Aortoiliac calcified atherosclerosis. Dystrophic pancreatic calcifications faintly visible in the epigastrium. Other abdominal and pelvic visceral contours appear negative. Incidental pelvic phleboliths. No acute osseous  abnormality identified. IMPRESSION: 1. No acute cardiopulmonary abnormality. 2.  Normal bowel gas pattern, no free air. 3. Chronic calcific pancreatitis, better demonstrated on recent CT. 4. Aortic Atherosclerosis (ICD10-I70.0). Electronically Signed   By: Odessa Fleming M.D.   On: 01/16/2023 04:52   DG Abd Portable 1V-Small Bowel Obstruction Protocol-initial, 8 hr delay  Result Date: 01/09/2023 CLINICAL DATA:  Small bowel protocol, 8 hour delay. EXAM: PORTABLE ABDOMEN - 1 VIEW COMPARISON:  CT earlier today FINDINGS: Administered enteric contrast is seen within the ascending, transverse, descending and rectosigmoid colon. No gaseous small bowel distension. IMPRESSION: Enteric contrast throughout the colon. No gaseous small bowel distension. Electronically Signed   By: Narda Rutherford M.D.   On: 01/09/2023 21:20   CT ABDOMEN PELVIS WO CONTRAST  Result Date: 01/09/2023 CLINICAL DATA:  Pancreatitis, acute, severe. EXAM: CT ABDOMEN AND PELVIS WITHOUT CONTRAST TECHNIQUE: Multidetector CT imaging of the abdomen and pelvis was performed following the standard protocol without IV contrast. RADIATION DOSE REDUCTION: This exam was performed according to the departmental dose-optimization program which includes automated exposure control, adjustment of the mA and/or kV according to patient size and/or use of iterative reconstruction technique. COMPARISON:  11/28/2022. FINDINGS: Lower chest: Emphysematous changes are noted in the lungs. Mild atelectasis or scarring is noted at the lung bases. There is a trace pericardial effusion. Hepatobiliary: No focal liver abnormality is seen. No gallstones, gallbladder wall thickening, or biliary dilatation. Pancreas: Multiple calcifications are noted in the pancreas, compatible with sequela of chronic pancreatitis. No biliary ductal dilatation. Cysts  are present in the pancreatic tail, the largest measuring 3.9 cm. There is a 1.4 cm cyst seen distal to this. A cyst is present at the tip  of the tail measuring 1.4 cm. No acute inflammatory changes are seen. Spleen: Normal in size without focal abnormality. Adrenals/Urinary Tract: The adrenal glands are within normal limits. No renal calculus or hydronephrosis bilaterally. The bladder is unremarkable. Stomach/Bowel: Stomach is within normal limits. Appendix appears normal. There are mildly distended fluid-filled loops of small bowel in the pelvis measuring up to 3.3 cm. No transition point is seen. No focal bowel wall thickening or surrounding inflammatory changes. No free air or pneumatosis. Vascular/Lymphatic: Aortic atherosclerosis. No enlarged abdominal or pelvic lymph nodes. Reproductive: Prostate is unremarkable. Other: No abdominopelvic ascites. Musculoskeletal: No acute osseous abnormality. IMPRESSION: 1. Sequela of chronic pancreatitis with multiple pseudocysts in the pancreatic tail, increased in size from the prior exam. No acute inflammatory changes are seen. 2. Mildly distended fluid-filled loops of small bowel in the pelvis measuring up to 3.3 cm. No transition point is identified. Findings may represent ileus versus partial or early small bowel obstruction. 3. Emphysema. 4. Aortic atherosclerosis. Electronically Signed   By: Thornell Sartorius M.D.   On: 01/09/2023 01:01   DG Chest 2 View  Result Date: 01/08/2023 CLINICAL DATA:  Chest pain on left EXAM: CHEST - 2 VIEW COMPARISON:  01/01/2023 FINDINGS: Normal cardiomediastinal silhouette. No focal consolidation, pleural effusion, or pneumothorax. No displaced rib fractures. IMPRESSION: No acute cardiopulmonary disease. Electronically Signed   By: Minerva Fester M.D.   On: 01/08/2023 19:28   DG Chest 2 View  Result Date: 01/01/2023 CLINICAL DATA:  Chest pain EXAM: CHEST - 2 VIEW COMPARISON:  None Available. FINDINGS: The heart size and mediastinal contours are within normal limits. No consolidation, pneumothorax or effusion. No edema. The visualized skeletal structures are  unremarkable. Overlapping cardiac leads. Air-fluid level along the stomach beneath the left hemidiaphragm. IMPRESSION: No acute cardiopulmonary disease Electronically Signed   By: Karen Kays M.D.   On: 01/01/2023 18:32    Microbiology: Results for orders placed or performed during the hospital encounter of 07/19/22  Resp panel by RT-PCR (RSV, Flu A&B, Covid) Anterior Nasal Swab     Status: None   Collection Time: 07/19/22  8:18 PM   Specimen: Anterior Nasal Swab  Result Value Ref Range Status   SARS Coronavirus 2 by RT PCR NEGATIVE NEGATIVE Final   Influenza A by PCR NEGATIVE NEGATIVE Final   Influenza B by PCR NEGATIVE NEGATIVE Final    Comment: (NOTE) The Xpert Xpress SARS-CoV-2/FLU/RSV plus assay is intended as an aid in the diagnosis of influenza from Nasopharyngeal swab specimens and should not be used as a sole basis for treatment. Nasal washings and aspirates are unacceptable for Xpert Xpress SARS-CoV-2/FLU/RSV testing.  Fact Sheet for Patients: BloggerCourse.com  Fact Sheet for Healthcare Providers: SeriousBroker.it  This test is not yet approved or cleared by the [place] FDA and has been authorized for detection and/or diagnosis of SARS-CoV-2 by FDA under an Emergency Use Authorization (EUA). This EUA will remain in effect (meaning this test can be used) for the duration of the COVID-19 declaration under Section 564(b)(1) of the Act, 21 U.S.C. section 360bbb-3(b)(1), unless the authorization is terminated or revoked.     Resp Syncytial Virus by PCR NEGATIVE NEGATIVE Final    Comment: (NOTE) Fact Sheet for Patients: BloggerCourse.com  Fact Sheet for Healthcare Providers: SeriousBroker.it  This test is not yet approved or cleared  by the [place] and has been authorized for detection and/or diagnosis of SARS-CoV-2 by FDA under an Emergency Use  Authorization (EUA). This EUA will remain in effect (meaning this test can be used) for the duration of the COVID-19 declaration under Section 564(b)(1) of the Act, 21 U.S.C. section 360bbb-3(b)(1), unless the authorization is terminated or revoked.  Performed at [facility], [address], [place], [place] [address]     Labs: CBC: Recent Labs  Lab 01/24/23 2341 01/25/23 2231 01/26/23 0313  WBC 8.1 10.4 9.5  HGB 9.5* 8.9* 9.0*  HCT 29.7* 27.6* 29.1*  MCV 85.6 83.4 83.6  PLT 200 179 180   Basic Metabolic Panel: Recent Labs  Lab 01/24/23 2341 01/25/23 2231 01/26/23 0313  NA 139  --  136  K 3.6  --  3.4*  CL 109  --  107  CO2 16*  --  18*  GLUCOSE 95  --  102*  BUN 14  --  5*  CREATININE 0.92 0.63 0.72  CALCIUM 8.6*  --  8.8*   Liver Function Tests: Recent Labs  Lab 01/25/23 2231 01/26/23 0313  AST 39 65*  ALT 48* 50*  ALKPHOS 95 92  BILITOT 0.5 0.8  PROT 7.0 7.1  ALBUMIN 3.6 3.7   CBG: No results for input(s): "GLUCAP" in the last 168 hours.  Discharge time spent: less than 30 minutes.  Signed: Rickey Barbara, MD [facility] 01/26/2023

## 2023-01-26 NOTE — Plan of Care (Signed)

## 2023-01-26 NOTE — Hospital Course (Signed)
52 y.o. male with medical history significant of acute recurrent alcoholic pancreatitis, alcohol use disorder, cocaine and marijuana use/abuse, hypertension, GERD, bipolar disorder, wolff-parkinson-white syndrome - who presents with subacute nausea/vomiting and abdominal pain. Interestingly denies alcohol use for approximately 2 weeks but admits to recent cocaine/marijuana use in the past few days - concurrent with onset of his symptoms. ED imaging remarkable for adynamic ileus appearance, given inability to take PO safely hospitalist was called for admission.

## 2023-01-30 ENCOUNTER — Telehealth: Payer: Self-pay | Admitting: Internal Medicine

## 2023-01-30 NOTE — Telephone Encounter (Signed)
Pt called for 2 reasons: To see what time his EUS was for tomorrow.  I instructed him this was for Oct 24 (not Sept) with Dr Meridee Score.  Looks like EUS at 1130 so I told him to be at [facility] at 10 AM.  He knows about NPO status. He also requests pain med as he is trying to avoid another ER visit.  He was given limited supply of oxycodone at discharge (#20).  He is trying to be judicious with these meds.   I instructed him I needed to defer this to his primary [facility] MD.  I will forward to SA.  He has appt with SA later in Oct.  He thanked me for the call JMP

## 2023-01-31 IMAGING — DX DG CHEST 1V PORT
1 series · 1 of 1 positions shown · non-contrast
Comparison: August 05, 2020.

CLINICAL DATA: Chest pain.

EXAM:
PORTABLE CHEST 1 VIEW

[chest]
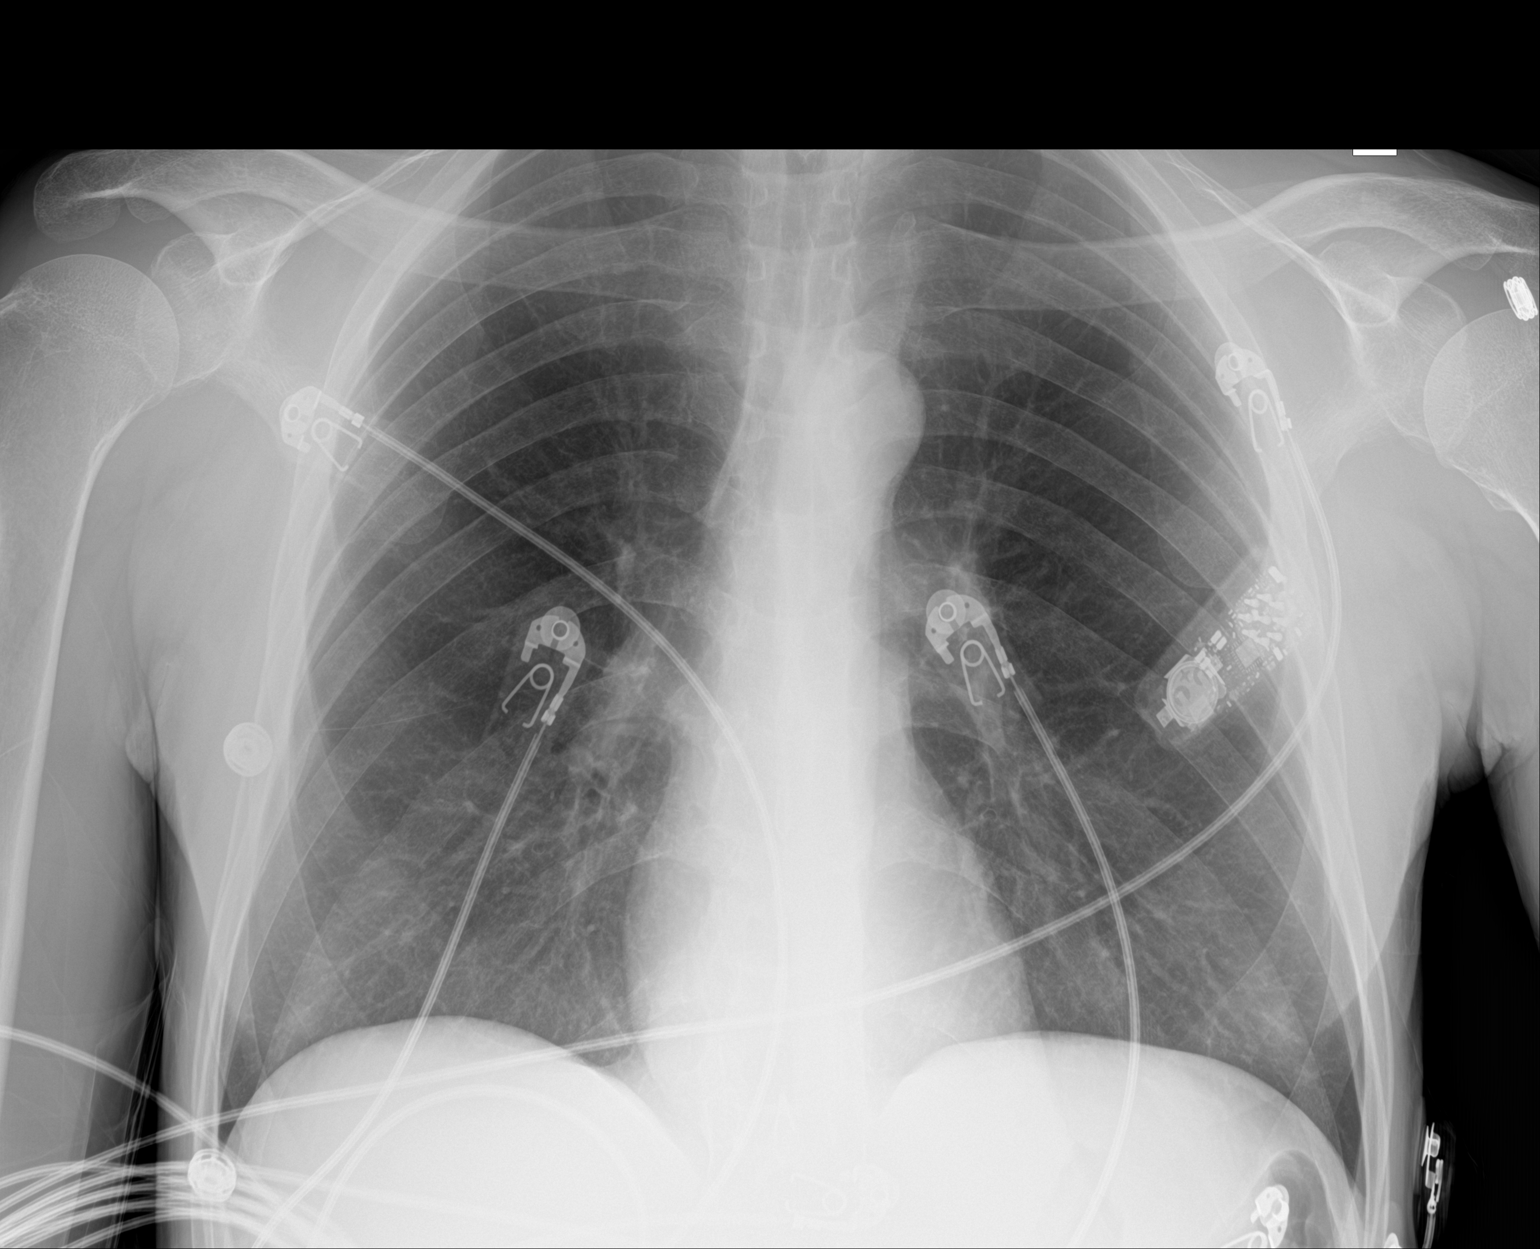

[1 of 1 positions shown; findings below may reference images not displayed]

FINDINGS: The heart size and mediastinal contours are within normal limits.
Both lungs are clear. No visible pleural effusions or pneumothorax.
No acute osseous abnormality. Electronic device projects over the
lateral left chest.
IMPRESSION: No evidence of acute cardiopulmonary disease.

## 2023-01-31 NOTE — Telephone Encounter (Signed)
Thanks JMP Agree with your recommendations, he needs to see his PCP for narcotics, I have not prescribed that for him.

## 2023-01-31 NOTE — Telephone Encounter (Signed)
PT is returning call to find out if something will be called in for pain since being discharged from hospital. Please adv

## 2023-01-31 NOTE — Telephone Encounter (Signed)
Returned call to patient twice. His phone line rings then goes to a fast busy signal. Pt will need to get pain meds from PCP as noted below by Dr. Adela Lank.

## 2023-02-01 NOTE — Telephone Encounter (Signed)
2nd attempt to reach patient. Phone rings then goes to fast busy signal. No option to leave a vm.

## 2023-02-02 NOTE — Telephone Encounter (Signed)
No return call received. Will await further communication from patient.

## 2023-02-05 ENCOUNTER — Emergency Department (HOSPITAL_COMMUNITY): Payer: MEDICAID

## 2023-02-05 ENCOUNTER — Emergency Department (HOSPITAL_COMMUNITY)
Admission: EM | Admit: 2023-02-05 | Discharge: 2023-02-06 | Disposition: A | Discharge status: Home / Self Care | Payer: MEDICAID | Attending: Emergency Medicine | Admitting: Emergency Medicine

## 2023-02-05 DIAGNOSIS — F1990 Other psychoactive substance use, unspecified, uncomplicated: Secondary | ICD-10-CM | POA: Diagnosis not present

## 2023-02-05 DIAGNOSIS — I10 Essential (primary) hypertension: Secondary | ICD-10-CM | POA: Diagnosis not present

## 2023-02-05 DIAGNOSIS — R4182 Altered mental status, unspecified: Secondary | ICD-10-CM | POA: Diagnosis present

## 2023-02-05 DIAGNOSIS — Z79899 Other long term (current) drug therapy: Secondary | ICD-10-CM | POA: Diagnosis not present

## 2023-02-05 DIAGNOSIS — Z91040 Latex allergy status: Secondary | ICD-10-CM | POA: Insufficient documentation

## 2023-02-05 LAB — CBG MONITORING, ED
Glucose-Capillary: 185 mg/dL — ABNORMAL HIGH (ref 70–99)
Glucose-Capillary: 48 mg/dL — ABNORMAL LOW (ref 70–99)

## 2023-02-05 LAB — COMPREHENSIVE METABOLIC PANEL
ALT: 35 U/L (ref 0–44)
AST: 46 U/L — ABNORMAL HIGH (ref 15–41)
Albumin: 4 g/dL (ref 3.5–5.0)
Alkaline Phosphatase: 97 U/L (ref 38–126)
Anion gap: 16 — ABNORMAL HIGH (ref 5–15)
BUN: 11 mg/dL (ref 6–20)
CO2: 16 mmol/L — ABNORMAL LOW (ref 22–32)
Calcium: 8.8 mg/dL — ABNORMAL LOW (ref 8.9–10.3)
Chloride: 107 mmol/L (ref 98–111)
Creatinine, Ser: 1.32 mg/dL — ABNORMAL HIGH (ref 0.61–1.24)
GFR, Estimated: >60 mL/min (ref >60)
Glucose, Bld: 96 mg/dL (ref 70–99)
Potassium: 4.4 mmol/L (ref 3.5–5.1)
Sodium: 139 mmol/L (ref 135–145)
Total Bilirubin: 0.8 mg/dL (ref 0.3–1.2)
Total Protein: 7.2 g/dL (ref 6.5–8.1)

## 2023-02-05 LAB — CBC WITH DIFFERENTIAL/PLATELET
Abs Immature Granulocytes: 0.01 10*3/uL (ref 0.00–0.07)
Basophils Absolute: 0.1 10*3/uL (ref 0.0–0.1)
Basophils Relative: 1 %
Eosinophils Absolute: 0.3 10*3/uL (ref 0.0–0.5)
Eosinophils Relative: 4 %
HCT: 31.6 % — ABNORMAL LOW (ref 39.0–52.0)
Hemoglobin: 10 g/dL — ABNORMAL LOW (ref 13.0–17.0)
Immature Granulocytes: 0 %
Lymphocytes Relative: 41 %
Lymphs Abs: 2.8 10*3/uL (ref 0.7–4.0)
MCH: 26.7 pg (ref 26.0–34.0)
MCHC: 31.6 g/dL (ref 30.0–36.0)
MCV: 84.5 fL (ref 80.0–100.0)
Monocytes Absolute: 0.8 10*3/uL (ref 0.1–1.0)
Monocytes Relative: 12 %
Neutro Abs: 2.9 10*3/uL (ref 1.7–7.7)
Neutrophils Relative %: 42 %
Platelets: 192 10*3/uL (ref 150–400)
RBC: 3.74 MIL/uL — ABNORMAL LOW (ref 4.22–5.81)
RDW: 18 % — ABNORMAL HIGH (ref 11.5–15.5)
WBC: 6.7 10*3/uL (ref 4.0–10.5)
nRBC: 0 % (ref 0.0–0.2)

## 2023-02-05 LAB — ETHANOL: Alcohol, Ethyl (B): 39 mg/dL — ABNORMAL HIGH (ref <10)

## 2023-02-05 LAB — SALICYLATE LEVEL: Salicylate Lvl: <7 mg/dL — ABNORMAL LOW (ref 7.0–30.0)

## 2023-02-05 LAB — ACETAMINOPHEN LEVEL: Acetaminophen (Tylenol), Serum: 12 ug/mL (ref 10–30)

## 2023-02-05 IMAGING — CR DG CHEST 2V
2 series · 2 of 2 positions shown · non-contrast
Comparison: Chest x-ray 08/10/2020.

CLINICAL DATA: 50-year-old male with history of central chest pain
radiating into the right arm. Dizziness.

EXAM:
CHEST - 2 VIEW

[chest lat]
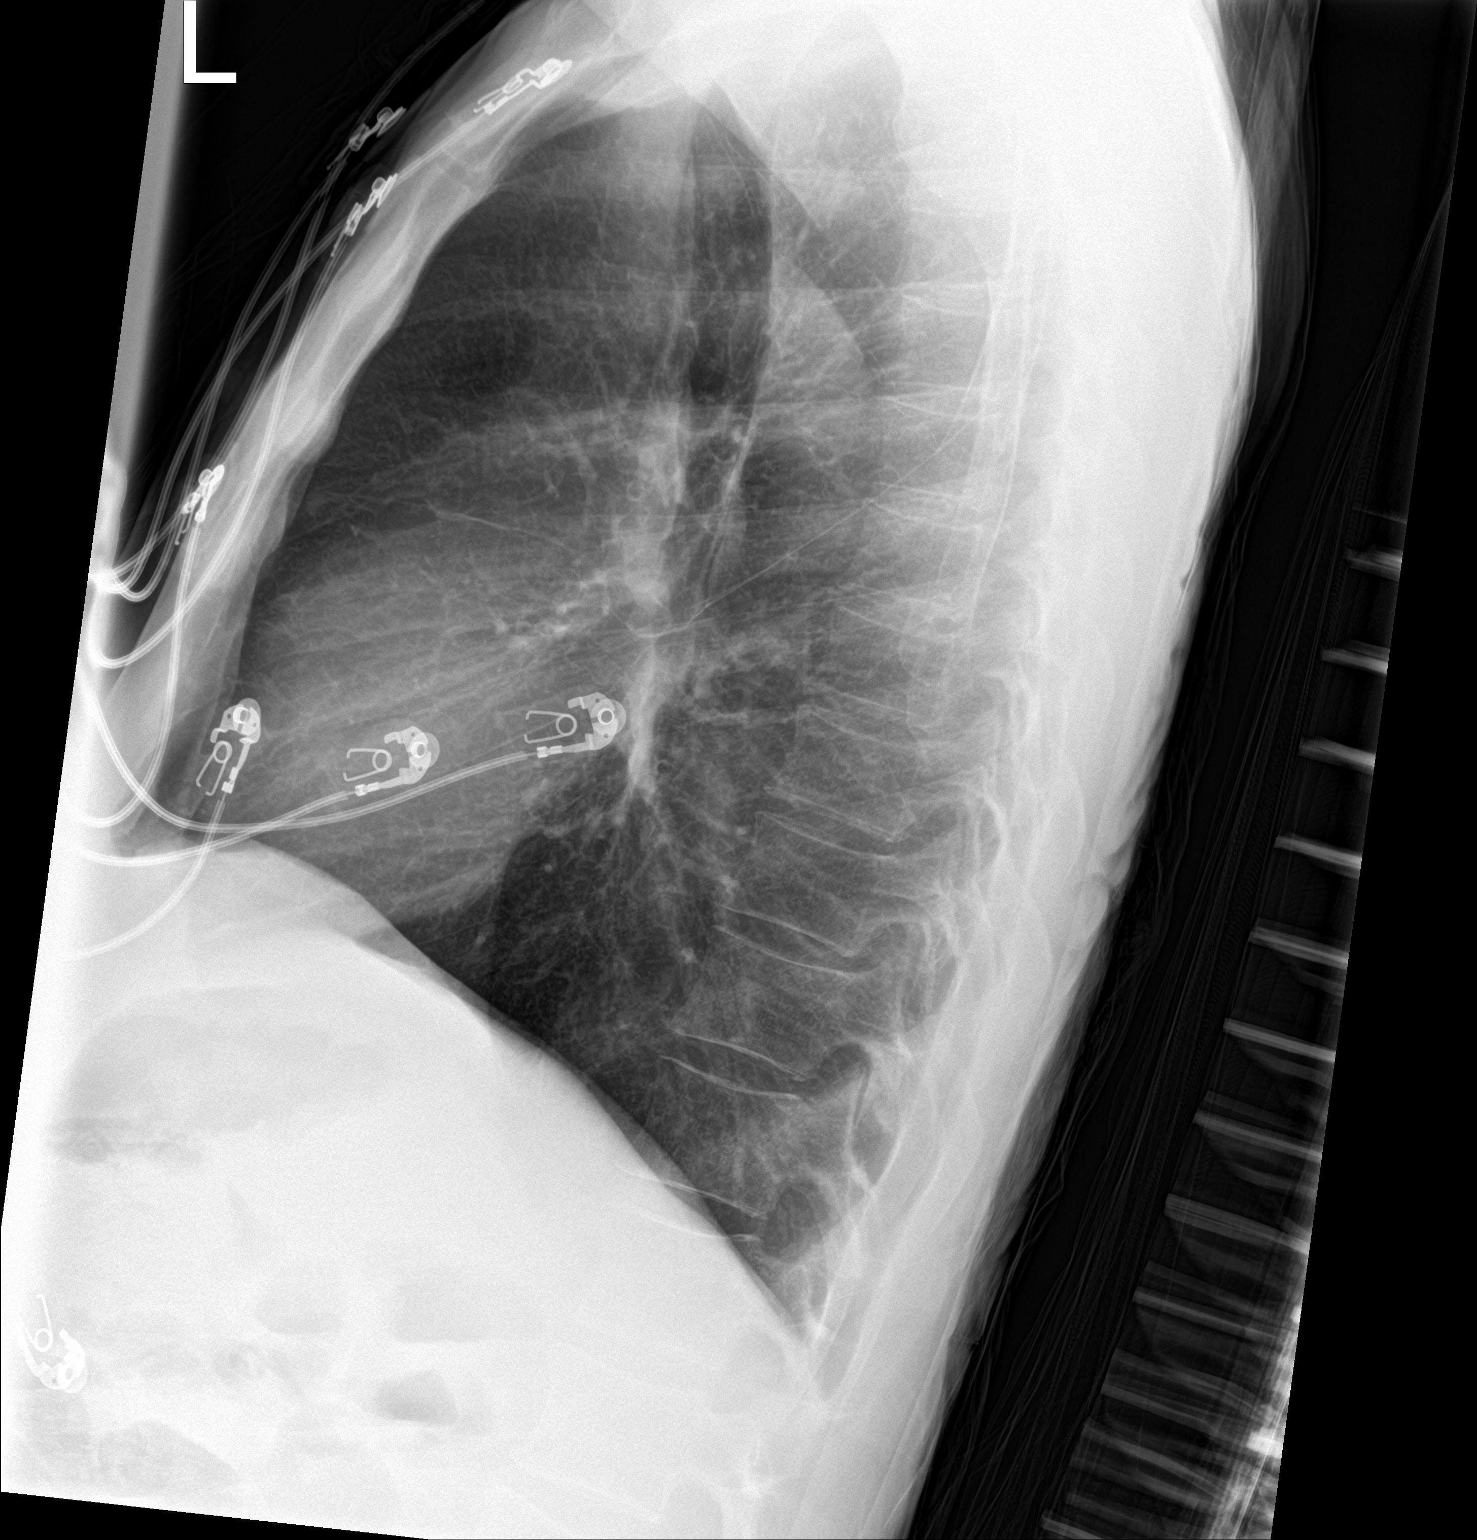

[chest ap]
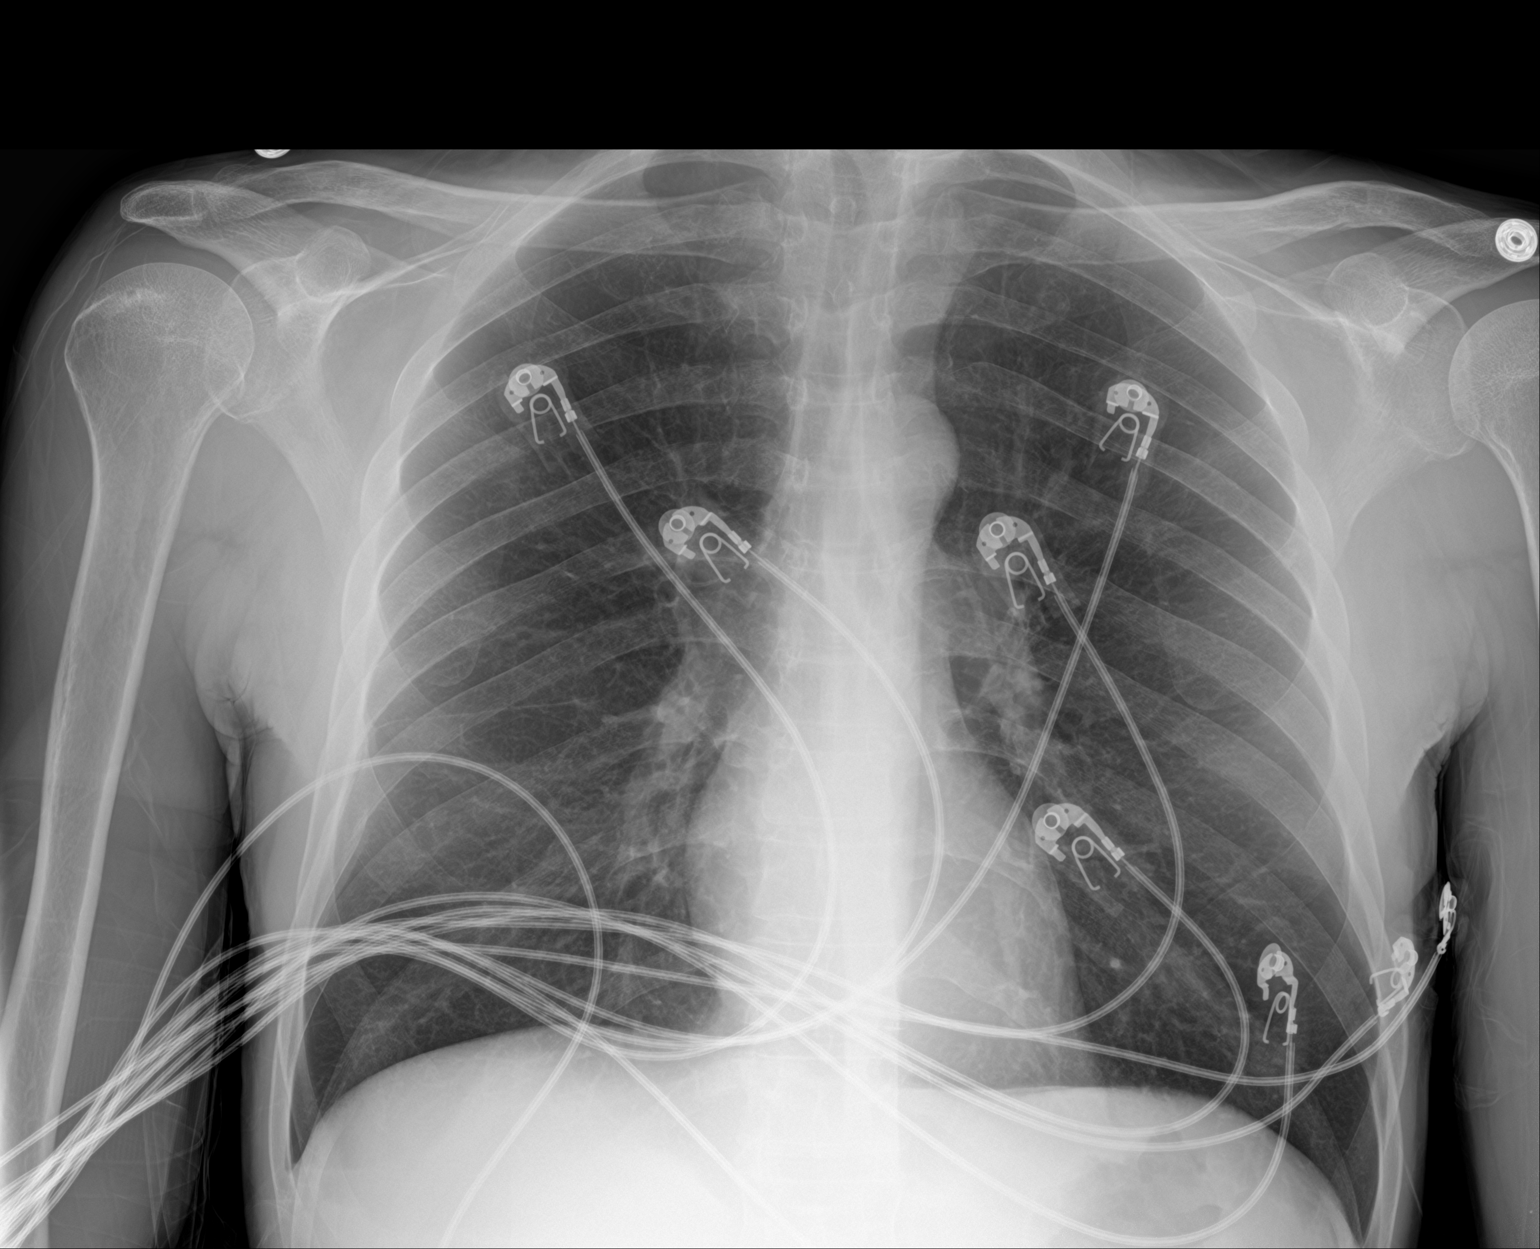

[2 of 2 positions shown; findings below may reference images not displayed]

FINDINGS: Lung volumes are normal. No consolidative airspace disease. No
pleural effusions. No pneumothorax. No pulmonary nodule or mass
noted. Pulmonary vasculature and the cardiomediastinal silhouette
are within normal limits.
IMPRESSION: No radiographic evidence of acute cardiopulmonary disease.

## 2023-02-05 MED ORDER — DEXTROSE 50 % IV SOLN
INTRAVENOUS | Status: AC
  Administered 2023-02-05: 50 mL via INTRAVENOUS
  Filled 2023-02-05: qty 50

## 2023-02-05 MED ORDER — DEXTROSE 50 % IV SOLN: 1.0000 | Freq: Once | INTRAVENOUS | Status: AC

## 2023-02-05 NOTE — ED Provider Notes (Signed)
[place] EMERGENCY DEPARTMENT AT [facility] Provider Note   CSN: [id number] Arrival date & time: 02/05/23  2209     History {Add pertinent medical, surgical, social history, OB history to HPI:1} Chief Complaint  Patient presents with   Altered Mental Status    [name] is a 52 y.o. male.  The history is provided by the EMS personnel and medical records.   Level 5 caveat: Altered mental status  52 year old male with history of alcohol abuse, polysubstance abuse, GERD, hypertension, paroxysmal A-fib not on anticoagulation, WPW, presenting to the ED with altered mental status.  Patient was apparently outside, came into the house at home when family noticed that he was very altered, not really making a lot of sense, not answering questions and seemed to be staring off in space.  He has improved a little bit with EMS but still far from baseline.  No reported trauma.  No one else available for additional history at this time.  Home Medications Prior to Admission medications   Medication Sig Start Date End Date Taking? Authorizing Provider  fluticasone (FLONASE) 50 MCG/ACT nasal spray Place 2 sprays into both nostrils 3 (three) times daily as needed for allergies.    [provider]  folic acid (FOLVITE) 1 MG tablet Take 1 tablet (1 mg total) by mouth daily. 09/21/22   Pokhrel, Rebekah Chesterfield, MD  lidocaine (XYLOCAINE) 5 % ointment Apply 1 Application topically as needed for mild pain. 01/26/23   Jerald Kief, MD  lipase/protease/amylase (CREON) 36000 UNITS CPEP capsule Take 2 capsules (72,000 Units total) by mouth 3 (three) times daily with meals. May also take 1 capsule (36,000 Units total) as needed (with snacks). 01/26/23   Jerald Kief, MD  metoprolol (TOPROL-XL) 200 MG 24 hr tablet Take 200 mg by mouth daily. 11/30/22   [provider]  nitroGLYCERIN (NITROSTAT) 0.4 MG SL tablet Place 0.4 mg under the tongue every 5 (five) minutes as needed for  chest pain.    [provider]  ondansetron (ZOFRAN-ODT) 4 MG disintegrating tablet Take 1 tablet (4 mg total) by mouth 2 (two) times daily as needed for nausea or vomiting. 01/26/23   Jerald Kief, MD  oxyCODONE (OXY IR/ROXICODONE) 5 MG immediate release tablet Take 1 tablet (5 mg total) by mouth every 4 (four) hours as needed for severe pain. 01/26/23   Jerald Kief, MD  pantoprazole (PROTONIX) 40 MG tablet Take 1 tablet (40 mg total) by mouth daily. 01/26/23   Jerald Kief, MD  pregabalin (LYRICA) 100 MG capsule Take 1 capsule (100 mg total) by mouth 2 (two) times daily. 11/30/22   Doree Albee, PA-C  sucralfate (CARAFATE) 1 GM/10ML suspension Take 10 mLs (1 g total) by mouth See admin instructions. Take 4 times with meals and at bedtime. Patient taking differently: Take 1 g by mouth 4 (four) times daily -  with meals and at bedtime. Take 4 times with meals and at bedtime. 01/11/23   Jerald Kief, MD  amitriptyline (ELAVIL) 25 MG tablet Take 1 tablet (25 mg total) by mouth at bedtime. Patient not taking: Reported on 08/08/2019 10/15/18 08/08/19  Rolly Salter, MD      Allergies    Robaxin [methocarbamol], Aspirin, Shellfish-derived products, Trazodone and nefazodone, Adhesive [tape], Latex, Toradol [ketorolac tromethamine], Contrast media [iodinated contrast media], Reglan [metoclopramide], and Salmon [fish oil]    Review of Systems   Review of Systems  Unable to perform ROS: Mental status  change    Physical Exam Updated Vital Signs BP (!) 203/120 (BP Location: Left Arm)   Pulse 70   Temp 98.1 F (36.7 C) (Oral)   Resp 16   SpO2 100%   Physical Exam Vitals and nursing note reviewed.  Constitutional:      Appearance: He is well-developed.     Comments: Awake, not really making any sense, clothes are wet/muddy  HENT:     Head: Normocephalic and atraumatic.     Comments: No visible head trauma Eyes:     Conjunctiva/sclera: Conjunctivae normal.     Pupils: Pupils  are equal, round, and reactive to light.  Cardiovascular:     Rate and Rhythm: Normal rate and regular rhythm.     Heart sounds: Normal heart sounds.  Pulmonary:     Effort: Pulmonary effort is normal.     Breath sounds: Normal breath sounds.  Abdominal:     General: Bowel sounds are normal.     Palpations: Abdomen is soft.  Musculoskeletal:        General: Normal range of motion.     Cervical back: Normal range of motion.  Skin:    General: Skin is warm and dry.  Neurological:     Comments: Awake, when asked about location he states "March 3rd" then begins spelling his name     ED Results / Procedures / Treatments   Labs (all labs ordered are listed, but only abnormal results are displayed) Labs Reviewed  CBC WITH DIFFERENTIAL/PLATELET  COMPREHENSIVE METABOLIC PANEL  ETHANOL  RAPID URINE DRUG SCREEN, HOSP PERFORMED  SALICYLATE LEVEL  ACETAMINOPHEN LEVEL  CBG MONITORING, ED    EKG None  Radiology No results found.  Procedures Procedures  {Document cardiac monitor, telemetry assessment procedure when appropriate:1}  Medications Ordered in ED Medications - No data to display  ED Course/ Medical Decision Making/ A&P   {   Click here for ABCD2, HEART and other calculatorsREFRESH Note before signing :1}                              Medical Decision Making Amount and/or Complexity of Data Reviewed Labs: ordered. Radiology: ordered. ECG/medicine tests: ordered.   ***  {Document critical care time when appropriate:1} {Document review of labs and clinical decision tools ie heart score, Chads2Vasc2 etc:1}  {Document your independent review of radiology images, and any outside records:1} {Document your discussion with family members, caretakers, and with consultants:1} {Document social determinants of health affecting pt's care:1} {Document your decision making why or why not admission, treatments were needed:1} Final Clinical Impression(s) / ED Diagnoses Final  diagnoses:  None    Rx / DC Orders ED Discharge Orders     None

## 2023-02-05 NOTE — ED Triage Notes (Signed)
Pt BIB [facility] from home with c/o altered mental status x 1 hour. Pt responds to questions but not appropriately. Family reports pt came home, came inside and was staring into space. Pt becoming more responsive since initial arrival per EMS.  EMS Vitals: Temp 97.64F BP 170/98 HR 63 O2 99% ETCO2 21 CBG 87

## 2023-02-06 ENCOUNTER — Encounter (HOSPITAL_COMMUNITY): Payer: Self-pay

## 2023-02-06 ENCOUNTER — Emergency Department (HOSPITAL_COMMUNITY): Payer: MEDICAID

## 2023-02-06 LAB — RAPID URINE DRUG SCREEN, HOSP PERFORMED
Amphetamines: NOT DETECTED
Barbiturates: NOT DETECTED
Benzodiazepines: NOT DETECTED
Cocaine: POSITIVE — AB
Opiates: NOT DETECTED
Tetrahydrocannabinol: POSITIVE — AB

## 2023-02-06 MED ORDER — ACETAMINOPHEN 500 MG PO TABS
1000.0000 mg | ORAL_TABLET | Freq: Once | ORAL | Status: AC
  Administered 2023-02-06: 1000 mg via ORAL
  Filled 2023-02-06: qty 2

## 2023-02-06 NOTE — ED Notes (Signed)
Pt given sandwich and drink at this time.  

## 2023-02-06 NOTE — Discharge Instructions (Signed)
You did have drugs and alcohol in your system today. Blood sugar was transiently low but back to normal now. Refrain from using illicit substances.  Make sure to eat/drink regularly. Follow-up with your doctor. Return here for new concerns.

## 2023-02-07 ENCOUNTER — Inpatient Hospital Stay (HOSPITAL_COMMUNITY)
Admission: EM | Admit: 2023-02-07 | Discharge: 2023-02-11 | DRG: 439 | Disposition: A | Discharge status: Home / Self Care | Payer: MEDICAID | Attending: Internal Medicine | Admitting: Internal Medicine

## 2023-02-07 ENCOUNTER — Other Ambulatory Visit: Payer: Self-pay

## 2023-02-07 ENCOUNTER — Encounter (HOSPITAL_COMMUNITY): Payer: Self-pay | Admitting: Radiology

## 2023-02-07 DIAGNOSIS — R52 Pain, unspecified: Secondary | ICD-10-CM | POA: Diagnosis present

## 2023-02-07 DIAGNOSIS — K862 Cyst of pancreas: Secondary | ICD-10-CM

## 2023-02-07 DIAGNOSIS — Z91048 Other nonmedicinal substance allergy status: Secondary | ICD-10-CM

## 2023-02-07 DIAGNOSIS — E7800 Pure hypercholesterolemia, unspecified: Secondary | ICD-10-CM | POA: Diagnosis present

## 2023-02-07 DIAGNOSIS — K863 Pseudocyst of pancreas: Secondary | ICD-10-CM | POA: Diagnosis present

## 2023-02-07 DIAGNOSIS — I480 Paroxysmal atrial fibrillation: Secondary | ICD-10-CM | POA: Diagnosis present

## 2023-02-07 DIAGNOSIS — Z7151 Drug abuse counseling and surveillance of drug abuser: Secondary | ICD-10-CM

## 2023-02-07 DIAGNOSIS — E860 Dehydration: Secondary | ICD-10-CM | POA: Diagnosis present

## 2023-02-07 DIAGNOSIS — I10 Essential (primary) hypertension: Secondary | ICD-10-CM | POA: Diagnosis present

## 2023-02-07 DIAGNOSIS — K860 Alcohol-induced chronic pancreatitis: Secondary | ICD-10-CM

## 2023-02-07 DIAGNOSIS — E8729 Other acidosis: Secondary | ICD-10-CM | POA: Diagnosis present

## 2023-02-07 DIAGNOSIS — Z811 Family history of alcohol abuse and dependence: Secondary | ICD-10-CM

## 2023-02-07 DIAGNOSIS — K852 Alcohol induced acute pancreatitis without necrosis or infection: Secondary | ICD-10-CM

## 2023-02-07 DIAGNOSIS — Z681 Body mass index (BMI) 19 or less, adult: Secondary | ICD-10-CM

## 2023-02-07 DIAGNOSIS — Z23 Encounter for immunization: Secondary | ICD-10-CM

## 2023-02-07 DIAGNOSIS — Z8249 Family history of ischemic heart disease and other diseases of the circulatory system: Secondary | ICD-10-CM

## 2023-02-07 DIAGNOSIS — Z885 Allergy status to narcotic agent status: Secondary | ICD-10-CM

## 2023-02-07 DIAGNOSIS — F4310 Post-traumatic stress disorder, unspecified: Secondary | ICD-10-CM | POA: Diagnosis present

## 2023-02-07 DIAGNOSIS — G8929 Other chronic pain: Secondary | ICD-10-CM | POA: Diagnosis present

## 2023-02-07 DIAGNOSIS — K859 Acute pancreatitis without necrosis or infection, unspecified: Secondary | ICD-10-CM | POA: Diagnosis present

## 2023-02-07 DIAGNOSIS — E440 Moderate protein-calorie malnutrition: Secondary | ICD-10-CM | POA: Diagnosis present

## 2023-02-07 DIAGNOSIS — R1033 Periumbilical pain: Secondary | ICD-10-CM

## 2023-02-07 DIAGNOSIS — F1410 Cocaine abuse, uncomplicated: Secondary | ICD-10-CM | POA: Diagnosis present

## 2023-02-07 DIAGNOSIS — Z91041 Radiographic dye allergy status: Secondary | ICD-10-CM

## 2023-02-07 DIAGNOSIS — K861 Other chronic pancreatitis: Principal | ICD-10-CM | POA: Diagnosis present

## 2023-02-07 DIAGNOSIS — Z881 Allergy status to other antibiotic agents status: Secondary | ICD-10-CM

## 2023-02-07 DIAGNOSIS — E875 Hyperkalemia: Secondary | ICD-10-CM | POA: Diagnosis present

## 2023-02-07 DIAGNOSIS — E872 Acidosis, unspecified: Secondary | ICD-10-CM | POA: Diagnosis present

## 2023-02-07 DIAGNOSIS — Z720 Tobacco use: Secondary | ICD-10-CM | POA: Diagnosis present

## 2023-02-07 DIAGNOSIS — Z888 Allergy status to other drugs, medicaments and biological substances status: Secondary | ICD-10-CM

## 2023-02-07 DIAGNOSIS — D573 Sickle-cell trait: Secondary | ICD-10-CM | POA: Diagnosis present

## 2023-02-07 DIAGNOSIS — F1010 Alcohol abuse, uncomplicated: Secondary | ICD-10-CM | POA: Diagnosis present

## 2023-02-07 DIAGNOSIS — K219 Gastro-esophageal reflux disease without esophagitis: Secondary | ICD-10-CM | POA: Diagnosis present

## 2023-02-07 DIAGNOSIS — Z91013 Allergy to seafood: Secondary | ICD-10-CM

## 2023-02-07 DIAGNOSIS — M545 Low back pain, unspecified: Secondary | ICD-10-CM | POA: Diagnosis present

## 2023-02-07 DIAGNOSIS — D72829 Elevated white blood cell count, unspecified: Secondary | ICD-10-CM | POA: Diagnosis present

## 2023-02-07 DIAGNOSIS — R109 Unspecified abdominal pain: Secondary | ICD-10-CM | POA: Diagnosis present

## 2023-02-07 DIAGNOSIS — Z91040 Latex allergy status: Secondary | ICD-10-CM

## 2023-02-07 DIAGNOSIS — Z79899 Other long term (current) drug therapy: Secondary | ICD-10-CM

## 2023-02-07 DIAGNOSIS — F17210 Nicotine dependence, cigarettes, uncomplicated: Secondary | ICD-10-CM | POA: Diagnosis present

## 2023-02-07 DIAGNOSIS — N179 Acute kidney failure, unspecified: Principal | ICD-10-CM | POA: Diagnosis present

## 2023-02-07 DIAGNOSIS — Z6372 Alcoholism and drug addiction in family: Secondary | ICD-10-CM

## 2023-02-07 DIAGNOSIS — Z716 Tobacco abuse counseling: Secondary | ICD-10-CM

## 2023-02-07 DIAGNOSIS — Z8711 Personal history of peptic ulcer disease: Secondary | ICD-10-CM

## 2023-02-07 DIAGNOSIS — F319 Bipolar disorder, unspecified: Secondary | ICD-10-CM | POA: Diagnosis present

## 2023-02-07 DIAGNOSIS — I456 Pre-excitation syndrome: Secondary | ICD-10-CM | POA: Diagnosis present

## 2023-02-07 DIAGNOSIS — Z886 Allergy status to analgesic agent status: Secondary | ICD-10-CM

## 2023-02-07 DIAGNOSIS — R112 Nausea with vomiting, unspecified: Secondary | ICD-10-CM | POA: Diagnosis present

## 2023-02-07 NOTE — ED Triage Notes (Signed)
Pt states he started having abd pain x3 hours ago after drinking alcohol. 2 40oz. Pt has a history of pancreatitis.

## 2023-02-08 ENCOUNTER — Observation Stay (HOSPITAL_COMMUNITY): Payer: MEDICAID

## 2023-02-08 DIAGNOSIS — R197 Diarrhea, unspecified: Secondary | ICD-10-CM

## 2023-02-08 DIAGNOSIS — K859 Acute pancreatitis without necrosis or infection, unspecified: Secondary | ICD-10-CM | POA: Diagnosis not present

## 2023-02-08 DIAGNOSIS — I456 Pre-excitation syndrome: Secondary | ICD-10-CM

## 2023-02-08 DIAGNOSIS — D72825 Bandemia: Secondary | ICD-10-CM

## 2023-02-08 DIAGNOSIS — I480 Paroxysmal atrial fibrillation: Secondary | ICD-10-CM

## 2023-02-08 DIAGNOSIS — R112 Nausea with vomiting, unspecified: Secondary | ICD-10-CM

## 2023-02-08 DIAGNOSIS — N179 Acute kidney failure, unspecified: Secondary | ICD-10-CM | POA: Diagnosis not present

## 2023-02-08 DIAGNOSIS — R109 Unspecified abdominal pain: Secondary | ICD-10-CM

## 2023-02-08 DIAGNOSIS — Z720 Tobacco use: Secondary | ICD-10-CM

## 2023-02-08 DIAGNOSIS — F1010 Alcohol abuse, uncomplicated: Secondary | ICD-10-CM

## 2023-02-08 DIAGNOSIS — K219 Gastro-esophageal reflux disease without esophagitis: Secondary | ICD-10-CM

## 2023-02-08 DIAGNOSIS — F1410 Cocaine abuse, uncomplicated: Secondary | ICD-10-CM | POA: Diagnosis present

## 2023-02-08 DIAGNOSIS — K861 Other chronic pancreatitis: Secondary | ICD-10-CM

## 2023-02-08 DIAGNOSIS — E8729 Other acidosis: Secondary | ICD-10-CM | POA: Diagnosis present

## 2023-02-08 LAB — URINALYSIS, ROUTINE W REFLEX MICROSCOPIC
Bacteria, UA: NONE SEEN
Bilirubin Urine: NEGATIVE
Glucose, UA: NEGATIVE mg/dL
Hgb urine dipstick: NEGATIVE
Ketones, ur: NEGATIVE mg/dL
Leukocytes,Ua: NEGATIVE
Nitrite: NEGATIVE
Protein, ur: 30 mg/dL — AB
Specific Gravity, Urine: 1.009 (ref 1.005–1.030)
pH: 5 (ref 5.0–8.0)

## 2023-02-08 LAB — COMPREHENSIVE METABOLIC PANEL
ALT: 36 U/L (ref 0–44)
AST: 46 U/L — ABNORMAL HIGH (ref 15–41)
Albumin: 4.5 g/dL (ref 3.5–5.0)
Alkaline Phosphatase: 113 U/L (ref 38–126)
Anion gap: 19 — ABNORMAL HIGH (ref 5–15)
BUN: 9 mg/dL (ref 6–20)
CO2: 13 mmol/L — ABNORMAL LOW (ref 22–32)
Calcium: 8.8 mg/dL — ABNORMAL LOW (ref 8.9–10.3)
Chloride: 106 mmol/L (ref 98–111)
Creatinine, Ser: 2.09 mg/dL — ABNORMAL HIGH (ref 0.61–1.24)
GFR, Estimated: 37 mL/min — ABNORMAL LOW (ref >60)
Glucose, Bld: 87 mg/dL (ref 70–99)
Potassium: 3.9 mmol/L (ref 3.5–5.1)
Sodium: 138 mmol/L (ref 135–145)
Total Bilirubin: 0.8 mg/dL (ref 0.3–1.2)
Total Protein: 8.5 g/dL — ABNORMAL HIGH (ref 6.5–8.1)

## 2023-02-08 LAB — CBC WITH DIFFERENTIAL/PLATELET
Abs Immature Granulocytes: 0.02 10*3/uL (ref 0.00–0.07)
Basophils Absolute: 0 10*3/uL (ref 0.0–0.1)
Basophils Relative: 0 %
Eosinophils Absolute: 0.2 10*3/uL (ref 0.0–0.5)
Eosinophils Relative: 2 %
HCT: 31 % — ABNORMAL LOW (ref 39.0–52.0)
Hemoglobin: 9.7 g/dL — ABNORMAL LOW (ref 13.0–17.0)
Immature Granulocytes: 0 %
Lymphocytes Relative: 37 %
Lymphs Abs: 3.2 10*3/uL (ref 0.7–4.0)
MCH: 26.6 pg (ref 26.0–34.0)
MCHC: 31.3 g/dL (ref 30.0–36.0)
MCV: 84.9 fL (ref 80.0–100.0)
Monocytes Absolute: 0.8 10*3/uL (ref 0.1–1.0)
Monocytes Relative: 10 %
Neutro Abs: 4.4 10*3/uL (ref 1.7–7.7)
Neutrophils Relative %: 51 %
Platelets: 173 10*3/uL (ref 150–400)
RBC: 3.65 MIL/uL — ABNORMAL LOW (ref 4.22–5.81)
RDW: 18.8 % — ABNORMAL HIGH (ref 11.5–15.5)
WBC: 8.6 10*3/uL (ref 4.0–10.5)
nRBC: 0.2 % (ref 0.0–0.2)

## 2023-02-08 LAB — PHOSPHORUS: Phosphorus: 5.5 mg/dL — ABNORMAL HIGH (ref 2.5–4.6)

## 2023-02-08 LAB — CBC
HCT: 35.9 % — ABNORMAL LOW (ref 39.0–52.0)
Hemoglobin: 10.7 g/dL — ABNORMAL LOW (ref 13.0–17.0)
MCH: 25.7 pg — ABNORMAL LOW (ref 26.0–34.0)
MCHC: 29.8 g/dL — ABNORMAL LOW (ref 30.0–36.0)
MCV: 86.3 fL (ref 80.0–100.0)
Platelets: 202 10*3/uL (ref 150–400)
RBC: 4.16 MIL/uL — ABNORMAL LOW (ref 4.22–5.81)
RDW: 19.4 % — ABNORMAL HIGH (ref 11.5–15.5)
WBC: 12.9 10*3/uL — ABNORMAL HIGH (ref 4.0–10.5)
nRBC: 0.2 % (ref 0.0–0.2)

## 2023-02-08 LAB — MAGNESIUM: Magnesium: 1.4 mg/dL — ABNORMAL LOW (ref 1.7–2.4)

## 2023-02-08 LAB — LIPASE, BLOOD: Lipase: 31 U/L (ref 11–51)

## 2023-02-08 LAB — CK: Total CK: 385 U/L (ref 49–397)

## 2023-02-08 LAB — PROTIME-INR
INR: 1.1 (ref 0.8–1.2)
Prothrombin Time: 14.4 s (ref 11.4–15.2)

## 2023-02-08 LAB — ETHANOL: Alcohol, Ethyl (B): 121 mg/dL — ABNORMAL HIGH (ref <10)

## 2023-02-08 LAB — C-REACTIVE PROTEIN: CRP: 0.6 mg/dL (ref <1.0)

## 2023-02-08 MED ORDER — GADOBUTROL 1 MMOL/ML IV SOLN
7.5000 mL | Freq: Once | INTRAVENOUS | Status: AC | PRN
  Administered 2023-02-08: 7.5 mL via INTRAVENOUS

## 2023-02-08 MED ORDER — MORPHINE SULFATE (PF) 2 MG/ML IV SOLN
2.0000 mg | Freq: Once | INTRAVENOUS | Status: AC
  Administered 2023-02-08: 2 mg via INTRAVENOUS
  Filled 2023-02-08: qty 1

## 2023-02-08 MED ORDER — ONDANSETRON HCL 4 MG PO TABS
4.0000 mg | ORAL_TABLET | Freq: Four times a day (QID) | ORAL | Status: DC | PRN
  Administered 2023-02-09 – 2023-02-11 (×3): 4 mg via ORAL
  Filled 2023-02-08 (×4): qty 1

## 2023-02-08 MED ORDER — SUCRALFATE 1 GM/10ML PO SUSP
1.0000 g | Freq: Three times a day (TID) | ORAL | Status: DC
  Administered 2023-02-08 – 2023-02-11 (×8): 1 g via ORAL
  Filled 2023-02-08 (×8): qty 10

## 2023-02-08 MED ORDER — LORAZEPAM 2 MG/ML IJ SOLN
1.0000 mg | INTRAMUSCULAR | Status: AC | PRN
  Administered 2023-02-08: 1 mg via INTRAVENOUS
  Filled 2023-02-08: qty 1

## 2023-02-08 MED ORDER — ONDANSETRON HCL 4 MG/2ML IJ SOLN
4.0000 mg | Freq: Once | INTRAMUSCULAR | Status: AC
  Administered 2023-02-08: 4 mg via INTRAVENOUS
  Filled 2023-02-08: qty 2

## 2023-02-08 MED ORDER — SODIUM CHLORIDE 0.9% FLUSH
3.0000 mL | Freq: Two times a day (BID) | INTRAVENOUS | Status: DC
  Administered 2023-02-08 – 2023-02-11 (×6): 3 mL via INTRAVENOUS

## 2023-02-08 MED ORDER — FLUTICASONE PROPIONATE 50 MCG/ACT NA SUSP: 2.0000 | Freq: Two times a day (BID) | NASAL | Status: DC | PRN

## 2023-02-08 MED ORDER — PANCRELIPASE (LIP-PROT-AMYL) 12000-38000 UNITS PO CPEP: 36000.0000 [IU] | ORAL_CAPSULE | ORAL | Status: DC | PRN

## 2023-02-08 MED ORDER — HYDRALAZINE HCL 20 MG/ML IJ SOLN: 10.0000 mg | INTRAMUSCULAR | Status: DC | PRN

## 2023-02-08 MED ORDER — ALUM & MAG HYDROXIDE-SIMETH 200-200-20 MG/5ML PO SUSP: 30.0000 mL | Freq: Four times a day (QID) | ORAL | Status: DC | PRN

## 2023-02-08 MED ORDER — LORAZEPAM 1 MG PO TABS: 1.0000 mg | ORAL_TABLET | ORAL | Status: AC | PRN

## 2023-02-08 MED ORDER — NICOTINE 21 MG/24HR TD PT24
21.0000 mg | MEDICATED_PATCH | Freq: Every day | TRANSDERMAL | Status: DC
  Administered 2023-02-08 – 2023-02-11 (×4): 21 mg via TRANSDERMAL
  Filled 2023-02-08 (×4): qty 1

## 2023-02-08 MED ORDER — LIDOCAINE VISCOUS HCL 2 % MT SOLN: 15.0000 mL | Freq: Two times a day (BID) | OROMUCOSAL | Status: DC | PRN

## 2023-02-08 MED ORDER — INFLUENZA VIRUS VACC SPLIT PF (FLUZONE) 0.5 ML IM SUSY
0.5000 mL | PREFILLED_SYRINGE | INTRAMUSCULAR | Status: AC
  Administered 2023-02-09: 0.5 mL via INTRAMUSCULAR
  Filled 2023-02-08: qty 0.5

## 2023-02-08 MED ORDER — MORPHINE SULFATE (PF) 2 MG/ML IV SOLN
2.0000 mg | INTRAVENOUS | Status: DC | PRN
  Administered 2023-02-08 – 2023-02-10 (×22): 2 mg via INTRAVENOUS
  Filled 2023-02-08 (×22): qty 1

## 2023-02-08 MED ORDER — LACTATED RINGERS IV BOLUS
1000.0000 mL | Freq: Once | INTRAVENOUS | Status: AC
  Administered 2023-02-08: 1000 mL via INTRAVENOUS

## 2023-02-08 MED ORDER — PANCRELIPASE (LIP-PROT-AMYL) 12000-38000 UNITS PO CPEP
72000.0000 [IU] | ORAL_CAPSULE | Freq: Three times a day (TID) | ORAL | Status: DC
  Administered 2023-02-08 – 2023-02-11 (×8): 72000 [IU] via ORAL
  Filled 2023-02-08 (×8): qty 6

## 2023-02-08 MED ORDER — ALBUTEROL SULFATE (2.5 MG/3ML) 0.083% IN NEBU: 2.5000 mg | INHALATION_SOLUTION | Freq: Four times a day (QID) | RESPIRATORY_TRACT | Status: DC | PRN

## 2023-02-08 MED ORDER — ACETAMINOPHEN 325 MG PO TABS: 650.0000 mg | ORAL_TABLET | Freq: Four times a day (QID) | ORAL | Status: DC | PRN

## 2023-02-08 MED ORDER — METOPROLOL SUCCINATE ER 100 MG PO TB24
200.0000 mg | ORAL_TABLET | Freq: Every day | ORAL | Status: DC
  Administered 2023-02-08 – 2023-02-11 (×4): 200 mg via ORAL
  Filled 2023-02-08 (×4): qty 2

## 2023-02-08 MED ORDER — MAGNESIUM SULFATE 2 GM/50ML IV SOLN
2.0000 g | Freq: Once | INTRAVENOUS | Status: AC
  Administered 2023-02-08: 2 g via INTRAVENOUS
  Filled 2023-02-08: qty 50

## 2023-02-08 MED ORDER — ONDANSETRON HCL 4 MG/2ML IJ SOLN: 4.0000 mg | Freq: Four times a day (QID) | INTRAMUSCULAR | Status: DC | PRN

## 2023-02-08 MED ORDER — ENOXAPARIN SODIUM 40 MG/0.4ML IJ SOSY
40.0000 mg | PREFILLED_SYRINGE | INTRAMUSCULAR | Status: DC
  Administered 2023-02-08 – 2023-02-10 (×3): 40 mg via SUBCUTANEOUS
  Filled 2023-02-08 (×3): qty 0.4

## 2023-02-08 MED ORDER — THIAMINE MONONITRATE 100 MG PO TABS
100.0000 mg | ORAL_TABLET | Freq: Every day | ORAL | Status: DC
  Administered 2023-02-08 – 2023-02-11 (×4): 100 mg via ORAL
  Filled 2023-02-08 (×4): qty 1

## 2023-02-08 MED ORDER — THIAMINE HCL 100 MG/ML IJ SOLN
100.0000 mg | Freq: Every day | INTRAMUSCULAR | Status: DC
  Filled 2023-02-08: qty 2

## 2023-02-08 MED ORDER — ACETAMINOPHEN 650 MG RE SUPP: 650.0000 mg | Freq: Four times a day (QID) | RECTAL | Status: DC | PRN

## 2023-02-08 MED ORDER — LACTATED RINGERS IV SOLN: INTRAVENOUS | Status: DC

## 2023-02-08 MED ORDER — PANTOPRAZOLE SODIUM 40 MG IV SOLR
40.0000 mg | Freq: Two times a day (BID) | INTRAVENOUS | Status: DC
  Administered 2023-02-08 – 2023-02-11 (×7): 40 mg via INTRAVENOUS
  Filled 2023-02-08 (×7): qty 10

## 2023-02-08 MED ORDER — OXYCODONE HCL 5 MG PO TABS
5.0000 mg | ORAL_TABLET | ORAL | Status: DC | PRN
  Administered 2023-02-09 (×2): 5 mg via ORAL
  Filled 2023-02-08 (×2): qty 1

## 2023-02-08 MED ORDER — FOLIC ACID 1 MG PO TABS
1.0000 mg | ORAL_TABLET | Freq: Every day | ORAL | Status: DC
  Administered 2023-02-08 – 2023-02-11 (×4): 1 mg via ORAL
  Filled 2023-02-08 (×4): qty 1

## 2023-02-08 MED ORDER — ADULT MULTIVITAMIN W/MINERALS CH
1.0000 | ORAL_TABLET | Freq: Every day | ORAL | Status: DC
  Administered 2023-02-08 – 2023-02-11 (×4): 1 via ORAL
  Filled 2023-02-08 (×4): qty 1

## 2023-02-08 MED ORDER — LIDOCAINE VISCOUS HCL 2 % MT SOLN: 15.0000 mL | Freq: Four times a day (QID) | OROMUCOSAL | Status: DC | PRN

## 2023-02-08 NOTE — ED Provider Notes (Signed)
  Physical Exam  BP (!) 169/107 (BP Location: Right Arm)   Pulse (!) 107   Temp 98.2 F (36.8 C) (Oral)   Resp 19   Ht 5\' 8"  (1.727 m)   Wt 54.9 kg   SpO2 100%   BMI 18.40 kg/m   Physical Exam Vitals and nursing note reviewed.  Constitutional:      General: He is not in acute distress.    Appearance: He is not toxic-appearing.  HENT:     Head: Normocephalic and atraumatic.  Pulmonary:     Effort: No respiratory distress.  Abdominal:     Tenderness: There is generalized abdominal tenderness.  Skin:    Coloration: Skin is not jaundiced or pale.  Neurological:     Mental Status: He is alert and oriented to person, place, and time.  Psychiatric:        Behavior: Behavior normal.     Procedures  Procedures  ED Course / MDM   Clinical Course as of 02/08/23 0733  Wed Feb 08, 2023  8657 Chronic pancreatitis, AKI. Drank yesterday, 2 40oz beers. N/V.  [CG]  8469 Admit for AKI [CG]    Clinical Course User Index [CG] Al Decant, PA-C   Medical Decision Making Amount and/or Complexity of Data Reviewed Labs: ordered.  Risk Prescription drug management. Decision regarding hospitalization.   52 year old male signed out to me at shift change pending admission.  Please see HPI previous provider for further details.  In short 51 year old male with chronic pancreatitis presents after consuming alcohol for the last 2 days.  Patient has had nausea and vomiting as result along with generalized abdominal pain.  Here his creatinine is elevated.  Was 1.3 3 days ago, today 2.09.  Patient has had fluids, pain medication and antinausea medication ordered.  Patient will require admission for AKI.  Discussed with Dr. Katrinka Blazing who has agreed to admit patient.  Patient is amenable to plan.  Additional 2 mg of morphine ordered at this time for patient pain.       Al Decant, PA-C 02/08/23 6295    Lorre Nick, MD 02/08/23 [phone]

## 2023-02-08 NOTE — ED Provider Notes (Signed)
[place] EMERGENCY DEPARTMENT AT [facility] Provider Note   CSN: [id number] Arrival date & time: 02/07/23  2323     History Chief Complaint  Patient presents with   Abdominal Pain    [name] is a 52 y.o. male.  Patient with history of pancreatic pseudocyst, Wolff-Parkinson-White, polysubstance abuse presents emergency department concerns of abdominal pain.  Reports that he began to experience abdominal pain about 3 hours after drinking alcohol.  Has a history of chronic pancreatitis and feels like his drinking made this pain worse.  Was seen in the emergency department on 02/05/2023 for concerns of altered mental status.  Patient reports to me that he last uses cocaine a few days ago.  Denies any auditory visual hallucinations.  Pain in the abdomen is primarily in the periumbilical region.   Abdominal Pain      Home Medications Prior to Admission medications   Medication Sig Start Date End Date Taking? Authorizing Provider  fluticasone (FLONASE) 50 MCG/ACT nasal spray Place 2 sprays into both nostrils 3 (three) times daily as needed for allergies.    [provider]  folic acid (FOLVITE) 1 MG tablet Take 1 tablet (1 mg total) by mouth daily. Patient not taking: Reported on 02/06/2023 09/21/22   Pokhrel, Rebekah Chesterfield, MD  lidocaine (XYLOCAINE) 5 % ointment Apply 1 Application topically as needed for mild pain. Patient not taking: Reported on 02/06/2023 01/26/23   Jerald Kief, MD  lipase/protease/amylase (CREON) 36000 UNITS CPEP capsule Take 2 capsules (72,000 Units total) by mouth 3 (three) times daily with meals. May also take 1 capsule (36,000 Units total) as needed (with snacks). Patient not taking: Reported on 02/06/2023 01/26/23   Jerald Kief, MD  metoprolol (TOPROL-XL) 200 MG 24 hr tablet Take 200 mg by mouth daily. 11/30/22   [provider]  nitroGLYCERIN (NITROSTAT) 0.4 MG SL tablet Place 0.4 mg under the tongue every 5 (five) minutes  as needed for chest pain. Patient not taking: Reported on 02/06/2023    [provider]  ondansetron (ZOFRAN-ODT) 4 MG disintegrating tablet Take 1 tablet (4 mg total) by mouth 2 (two) times daily as needed for nausea or vomiting. Patient not taking: Reported on 02/06/2023 01/26/23   Jerald Kief, MD  oxyCODONE (OXY IR/ROXICODONE) 5 MG immediate release tablet Take 1 tablet (5 mg total) by mouth every 4 (four) hours as needed for severe pain. 01/26/23   Jerald Kief, MD  pantoprazole (PROTONIX) 40 MG tablet Take 1 tablet (40 mg total) by mouth daily. 01/26/23   Jerald Kief, MD  pregabalin (LYRICA) 100 MG capsule Take 1 capsule (100 mg total) by mouth 2 (two) times daily. 11/30/22   Doree Albee, PA-C  sucralfate (CARAFATE) 1 GM/10ML suspension Take 10 mLs (1 g total) by mouth See admin instructions. Take 4 times with meals and at bedtime. Patient taking differently: Take 1 g by mouth 4 (four) times daily -  with meals and at bedtime. Take 4 times with meals and at bedtime. 01/11/23   Jerald Kief, MD  amitriptyline (ELAVIL) 25 MG tablet Take 1 tablet (25 mg total) by mouth at bedtime. Patient not taking: Reported on 08/08/2019 10/15/18 08/08/19  Rolly Salter, MD      Allergies    Robaxin [methocarbamol], Aspirin, Shellfish-derived products, Trazodone and nefazodone, Adhesive [tape], Latex, Toradol [ketorolac tromethamine], Contrast media [iodinated contrast media], Reglan [metoclopramide], and Rosanne Ashing oil]    Review of Systems   Review  of Systems  Gastrointestinal:  Positive for abdominal pain.  All other systems reviewed and are negative.   Physical Exam Updated Vital Signs BP (!) 158/97 (BP Location: Right Arm)   Pulse (!) 47   Temp 97.7 F (36.5 C)   Resp 20   Ht 5\' 8"  (1.727 m)   Wt 54.9 kg   SpO2 95%   BMI 18.40 kg/m  Physical Exam Vitals and nursing note reviewed.  Constitutional:      General: He is not in acute distress.    Appearance: He is  well-developed.  HENT:     Head: Normocephalic and atraumatic.  Eyes:     Conjunctiva/sclera: Conjunctivae normal.  Cardiovascular:     Rate and Rhythm: Normal rate and regular rhythm.     Heart sounds: No murmur heard. Pulmonary:     Effort: Pulmonary effort is normal. No respiratory distress.     Breath sounds: Normal breath sounds.  Abdominal:     General: Bowel sounds are decreased.     Palpations: Abdomen is soft.     Tenderness: There is abdominal tenderness in the periumbilical area.  Musculoskeletal:        General: No swelling.     Cervical back: Neck supple.  Skin:    General: Skin is warm and dry.     Capillary Refill: Capillary refill takes less than 2 seconds.  Neurological:     Mental Status: He is alert.  Psychiatric:        Mood and Affect: Mood normal.     ED Results / Procedures / Treatments   Labs (all labs ordered are listed, but only abnormal results are displayed) Labs Reviewed  COMPREHENSIVE METABOLIC PANEL - Abnormal; Notable for the following components:      Result Value   CO2 13 (*)    Creatinine, Ser 2.09 (*)    Calcium 8.8 (*)    Total Protein 8.5 (*)    AST 46 (*)    GFR, Estimated 37 (*)    Anion gap 19 (*)    All other components within normal limits  CBC - Abnormal; Notable for the following components:   WBC 12.9 (*)    RBC 4.16 (*)    Hemoglobin 10.7 (*)    HCT 35.9 (*)    MCH 25.7 (*)    MCHC 29.8 (*)    RDW 19.4 (*)    All other components within normal limits  URINALYSIS, ROUTINE W REFLEX MICROSCOPIC - Abnormal; Notable for the following components:   APPearance HAZY (*)    Protein, ur 30 (*)    All other components within normal limits  ETHANOL - Abnormal; Notable for the following components:   Alcohol, Ethyl (B) 121 (*)    All other components within normal limits  LIPASE, BLOOD    EKG None  Radiology No results found.  Procedures Procedures   Medications Ordered in ED Medications  lactated ringers bolus  1,000 mL (1,000 mLs Intravenous New Bag/Given 02/08/23 0654)  morphine (PF) 2 MG/ML injection 2 mg (2 mg Intravenous Given 02/08/23 0654)  ondansetron (ZOFRAN) injection 4 mg (4 mg Intravenous Given 02/08/23 4540)    ED Course/ Medical Decision Making/ A&P Clinical Course as of 02/08/23 0701  Wed Feb 08, 2023  0649 Chronic pancreatitis, AKI. Drank yesterday, 2 40oz beers. N/V.  [CG]  9811 Admit for AKI [CG]    Clinical Course User Index [CG] Al Decant, PA-C  Medical Decision Making Amount and/or Complexity of Data Reviewed Labs: ordered. Radiology: ordered.  Risk Prescription drug management.   This patient presents to the ED for concern of abdominal pain.  Differential diagnosis includes pancreatitis, bowel obstruction, pyelonephritis   Lab Tests:  I Ordered, and personally interpreted labs.  The pertinent results include: CBC with leukocytosis of 12.9, CMP with evidence of AKI with elevation in creatinine up to 2.09 compared to CMP 3 days ago with creatinine at 1.32, lipase unremarkable, ethanol elevated at 121, UA without signs of infection.   Medicines ordered and prescription drug management:  I ordered medication including fluids, morphine, Zofran for dehydration, pain, nausea Reevaluation of the patient after these medicines showed that the patient improved I have reviewed the patients home medicines and have made adjustments as needed   Problem List / ED Course:  Patient presents to the emergency department concerns of abdominal pain.  Reports that he has a history of chronic pancreatitis and recently drank alcohol yesterday prior to arriving in the emergency department.  Believes that this has caused the symptoms to worsen.  Has had some nausea and vomiting prior to coming to the emergency department.  No episodes of nausea or vomiting witnessed while patient has been here. CBC with mild leukocytosis of 12.9 and CMP showing  evidence of AKI with elevation in creatinine.  GFR also decreased.  Will require admission for this. Patient able to have a line in with fluids, morphine, Zofran given for symptom management.  Denies need for admission and patient is in agreement with this plan.  7:01 AM Care of [name] transferred to [facility] and Dr. Freida Busman at the end of my shift as the patient will require reassessment once labs/imaging have resulted. Patient presentation, ED course, and plan of care discussed with review of all pertinent labs and imaging. Please see his/her note for further details regarding further ED course and disposition. Plan at time of handoff is admit for AKI. Patient agreeable with this. Does not appear to be acutely withdrawing from alcohol but is likely having worsened chronic pancreatitis pain. This may be altered or completely changed at the discretion of the oncoming team pending results of further workup.  Final Clinical Impression(s) / ED Diagnoses Final diagnoses:  None    Rx / DC Orders ED Discharge Orders     None         Smitty Knudsen, PA-C 02/08/23 0701    Shon Baton, MD 02/09/23 [phone]

## 2023-02-08 NOTE — H&P (Signed)
History and Physical    Patient: [name] WJX:[id number] DOB: 1970/08/03 DOA: 02/07/2023 DOS: the patient was seen and examined on 02/08/2023 PCP: Pcp, No  Patient coming from: Home  Chief Complaint:  Chief Complaint  Patient presents with   Abdominal Pain   HPI: [name] is a 52 y.o. male with medical history significant of hypertension, paroxysmal atrial fibrillation not on anticoagulation, Wolff-Parkinson-White syndrome s/p ablation, recurrent alcoholic pancreatitis with pancreatic pseudocyst, bipolar disorder, GERD, and polysubstance abuse(alcohol, tobacco, cocaine, marijuana) who presents with complaints of abdominal pain that started yesterday evening.  He reports having severe pain mostly in the left middle part of his abdomen, but also midline on the right side as well.  He reported having associated symptoms of nausea, vomiting, and diarrhea.  Over the last 2 days he had drank about half a pint of vodka as well as 15 twelve ounce beers.  He also admitted to using cocaine about 5 days ago.  Denies having any significant fever, chills, blood in stools, or recent antibiotics.  In the emergency department patient was noted to be afebrile, pulse 47-1 07, blood pressures elevated up to 169/107, and O2 saturations maintained on room air.  Labs significant for WBC 12.9, hemoglobin 10.7, CO2 13, BUN 9, creatinine 2.09, anion gap 19, lipase 31, and AST 46.  Patient had been given a liter of lactated Ringer's, Zofran 4 mg IV, and morphine IV for pain.  Review of Systems: As mentioned in the history of present illness. All other systems reviewed and are negative. Past Medical History:  Diagnosis Date   Alcoholism /alcohol abuse    Anemia    Anxiety    Arthritis    knees; arms; elbows (03/26/2015)   Asthma    Bipolar disorder (HCC)    Chronic bronchitis (HCC)    Chronic lower back pain    Chronic pancreatitis (HCC)    Cocaine abuse (HCC)    Depression    Family  history of adverse reaction to anesthesia    Femoral condyle fracture (HCC) 03/08/2014   left medial/notes 03/09/2014   GERD (gastroesophageal reflux disease)    H/O hiatal hernia    H/O suicide attempt 10/2012   High cholesterol    History of blood transfusion 10/2012   when I tried to commit suicide   History of stomach ulcers    Hypertension    Marijuana abuse, continuous    Migraine    a few times/year (03/26/2015)   Pancreatitis    Pneumonia 1990's X 3   PTSD (post-traumatic stress disorder)    Seizures (HCC)    Sickle cell trait (HCC)    WPW (Wolff-Parkinson-White syndrome)    [name] 03/06/2013   Past Surgical History:  Procedure Laterality Date   BIOPSY  11/25/2017   Procedure: BIOPSY;  Surgeon: [name] Modena, MD;  Location: [facility] ENDOSCOPY;  Service: Endoscopy;;   BIOPSY  10/14/2018   Procedure: BIOPSY;  Surgeon: [name] Modena, MD;  Location: [facility] ENDOSCOPY;  Service: Endoscopy;;   CARDIAC CATHETERIZATION     CYST ENTEROSTOMY  01/02/2020   Procedure: CYST ASPIRATION;  Surgeon: Rachael Fee, MD;  Location: [facility] ENDOSCOPY;  Service: Endoscopy;;   ESOPHAGOGASTRODUODENOSCOPY (EGD) WITH PROPOFOL N/A 11/25/2017   Procedure: ESOPHAGOGASTRODUODENOSCOPY (EGD) WITH PROPOFOL;  Surgeon: [name] Modena, MD;  Location: [facility] ENDOSCOPY;  Service: Endoscopy;  Laterality: N/A;   ESOPHAGOGASTRODUODENOSCOPY (EGD) WITH PROPOFOL Left 10/14/2018   Procedure: ESOPHAGOGASTRODUODENOSCOPY (EGD) WITH PROPOFOL;  Surgeon: [name] Modena, MD;  Location: [facility] ENDOSCOPY;  Service: Endoscopy;  Laterality: Left;   ESOPHAGOGASTRODUODENOSCOPY (EGD) WITH PROPOFOL N/A 11/14/2018   Procedure: ESOPHAGOGASTRODUODENOSCOPY (EGD) WITH PROPOFOL;  Surgeon: Carman Ching, MD;  Location: [facility] ENDOSCOPY;  Service: Gastroenterology;  Laterality: N/A;   ESOPHAGOGASTRODUODENOSCOPY (EGD) WITH PROPOFOL N/A 01/02/2020   Procedure: ESOPHAGOGASTRODUODENOSCOPY (EGD) WITH PROPOFOL;  Surgeon: Rachael Fee, MD;  Location: [facility] ENDOSCOPY;   Service: Endoscopy;  Laterality: N/A;   ESOPHAGOGASTRODUODENOSCOPY (EGD) WITH PROPOFOL N/A 10/25/2020   Procedure: ESOPHAGOGASTRODUODENOSCOPY (EGD) WITH PROPOFOL;  Surgeon: Meridee Score Netty Starring., MD;  Location: [facility] ENDOSCOPY;  Service: Gastroenterology;  Laterality: N/A;   EUS N/A 01/02/2020   Procedure: UPPER ENDOSCOPIC ULTRASOUND (EUS) RADIAL;  Surgeon: Rachael Fee, MD;  Location: [facility] ENDOSCOPY;  Service: Endoscopy;  Laterality: N/A;   EYE SURGERY Left 1990's   "result of trauma"    FACIAL FRACTURE SURGERY Left 1990's   "result of trauma"    FLEXIBLE SIGMOIDOSCOPY N/A 10/25/2020   Procedure: FLEXIBLE SIGMOIDOSCOPY;  Surgeon: Lemar Lofty., MD;  Location: [facility] ENDOSCOPY;  Service: Gastroenterology;  Laterality: N/A;   FRACTURE SURGERY     HEMOSTASIS CLIP PLACEMENT  10/25/2020   Procedure: HEMOSTASIS CLIP PLACEMENT;  Surgeon: Lemar Lofty., MD;  Location: [facility] ENDOSCOPY;  Service: Gastroenterology;;   HERNIA REPAIR     LEFT HEART CATHETERIZATION WITH CORONARY ANGIOGRAM Right 03/07/2013   Procedure: LEFT HEART CATHETERIZATION WITH CORONARY ANGIOGRAM;  Surgeon: Ricki Rodriguez, MD;  Location: [facility] CATH LAB;  Service: Cardiovascular;  Laterality: Right;   UMBILICAL HERNIA REPAIR     UPPER GASTROINTESTINAL ENDOSCOPY     Social History:  reports that he has been smoking cigarettes and e-cigarettes. He has a 36 pack-year smoking history. He has never used smokeless tobacco. He reports current alcohol use of about 4.0 standard drinks of alcohol per week. He reports that he does not currently use drugs after having used the following drugs: Marijuana and Cocaine.  Allergies  Allergen Reactions   Robaxin [Methocarbamol] Other (See Comments)    jumpy limbs   Aspirin Other (See Comments)    Unknown reaction   Shellfish-Derived Products Nausea And Vomiting and Rash   Trazodone And Nefazodone Other (See Comments)    Muscle spasms   Adhesive [Tape] Itching   Latex Itching   Toradol  [Ketorolac Tromethamine] Other (See Comments)    Has ulcers; cannot have this   Contrast Media [Iodinated Contrast Media] Hives   Reglan [Metoclopramide] Other (See Comments)    Muscle spasms   Salmon [Fish Oil] Nausea And Vomiting and Rash    Family History  Problem Relation Age of Onset   Hypertension Mother    Cirrhosis Father    Alcoholism Father    Hypertension Father    Melanoma Father    Hypertension Other    Coronary artery disease Other     Prior to Admission medications   Medication Sig Start Date End Date Taking? Authorizing Provider  fluticasone (FLONASE) 50 MCG/ACT nasal spray Place 2 sprays into both nostrils 3 (three) times daily as needed for allergies.    [provider]  folic acid (FOLVITE) 1 MG tablet Take 1 tablet (1 mg total) by mouth daily. Patient not taking: Reported on 02/06/2023 09/21/22   Pokhrel, Rebekah Chesterfield, MD  lidocaine (XYLOCAINE) 5 % ointment Apply 1 Application topically as needed for mild pain. Patient not taking: Reported on 02/06/2023 01/26/23   Jerald Kief, MD  lipase/protease/amylase (CREON) 36000 UNITS CPEP capsule Take 2 capsules (72,000 Units total) by mouth 3 (three) times daily  with meals. May also take 1 capsule (36,000 Units total) as needed (with snacks). Patient not taking: Reported on 02/06/2023 01/26/23   Jerald Kief, MD  metoprolol (TOPROL-XL) 200 MG 24 hr tablet Take 200 mg by mouth daily. 11/30/22   [provider]  nitroGLYCERIN (NITROSTAT) 0.4 MG SL tablet Place 0.4 mg under the tongue every 5 (five) minutes as needed for chest pain. Patient not taking: Reported on 02/06/2023    [provider]  ondansetron (ZOFRAN-ODT) 4 MG disintegrating tablet Take 1 tablet (4 mg total) by mouth 2 (two) times daily as needed for nausea or vomiting. Patient not taking: Reported on 02/06/2023 01/26/23   Jerald Kief, MD  oxyCODONE (OXY IR/ROXICODONE) 5 MG immediate release tablet Take 1 tablet (5 mg total) by mouth every  4 (four) hours as needed for severe pain. 01/26/23   Jerald Kief, MD  pantoprazole (PROTONIX) 40 MG tablet Take 1 tablet (40 mg total) by mouth daily. 01/26/23   Jerald Kief, MD  pregabalin (LYRICA) 100 MG capsule Take 1 capsule (100 mg total) by mouth 2 (two) times daily. 11/30/22   Doree Albee, PA-C  sucralfate (CARAFATE) 1 GM/10ML suspension Take 10 mLs (1 g total) by mouth See admin instructions. Take 4 times with meals and at bedtime. Patient taking differently: Take 1 g by mouth 4 (four) times daily -  with meals and at bedtime. Take 4 times with meals and at bedtime. 01/11/23   Jerald Kief, MD  amitriptyline (ELAVIL) 25 MG tablet Take 1 tablet (25 mg total) by mouth at bedtime. Patient not taking: Reported on 08/08/2019 10/15/18 08/08/19  Rolly Salter, MD    Physical Exam: Vitals:   02/07/23 2325 02/07/23 2325 02/08/23 0204 02/08/23 0700  BP:  (!) 157/105 (!) 158/97 (!) 169/107  Pulse:  (!) 104 (!) 47 (!) 107  Resp:  20 20 19   Temp:  (!) 97.4 F (36.3 C) 97.7 F (36.5 C) 98.2 F (36.8 C)  TempSrc:    Oral  SpO2:  100% 95% 100%  Weight: 54.9 kg     Height: 5\' 8"  (1.727 m)      Constitutional: Middle-age male who appears in discomfort Eyes: PERRL, lids and conjunctivae normal ENMT: Mucous membranes are dry.  Poor dentition. Neck: normal, supple, no masses, no thyromegaly Respiratory: clear to auscultation bilaterally, no wheezing, no crackles. Normal respiratory effort.   Cardiovascular: Regular rate and rhythm, no murmurs / rubs / gallops. No extremity edema.  Abdomen: Moderate tenderness palpation of the left quadrant of the abdomen with less tenderness noted to palpation midline. Bowel sounds positive.  Musculoskeletal: no clubbing / cyanosis. No joint deformity upper and lower extremities. Good ROM, no contractures.  Skin: no rashes, lesions, ulcers. No induration Neurologic: CN 2-12 grossly intact.   Strength 5/5 in all 4.  Mild tremor present. Psychiatric:  Normal judgment and insight. Alert and oriented x 3. Normal mood.   Data Reviewed:  EKG revealed sinus tachycardia 102 bpm with biatrial enlargement with a nonspecific T wave abnormality present in the inferior leads.  Reviewed labs, imaging, and pertinent records as noted above in HPI.  Assessment and Plan:  Abdominal pain Patient presents with complaints of abdominal pain after recently drinking alcohol for the last 2 days.  Lipase was noted to be within normal limits similar to prior.  Last CT scan performed on 9/18 noted similar mural thickening along the greater curvature of the stomach concerning for underdistention or  gastritis, sequela from chronic pancreatitis with similar hypoattenuating lesions in the pancreatic body and tail which may represent pseudocyst.  Records note patient was scheduled to have a MRCP of the abdomen with [facility].  Question acute on chronic pancreatitis versus alcoholic gastritis/PUD versus other. -Admit to a medical telemetry bed -Clear liquid diet and advance as tolerated -Lactated Ringer's at 125 mL/h -Oxycodone/morphine IV as needed for moderate to severe pain -Check MRCP with and without contrast   Nausea, vomiting, and diarrhea Acute.  Patient reports having nausea, vomiting, and diarrhea.  Unclear if symptoms are related to chronic pancreatitis +/- recent cocaine use. -Monitor intake and output -Antiemetics as needed -Continue supportive care as noted above  Leukocytosis Acute.  On admission WBC elevated at 12.9.  Patient was noted to be afebrile.  Question of reactive to above. -Recheck CBC with differential  Acute kidney injury Patient presents with creatinine elevated up to 2.09 with BUN 9.  Baseline creatinine previously noted to be 0.6-0.7 last month.  Urinalysis was positive for ketones without signs for infection or blood to suggest kidney stone. Patient had been started on 1 L normal saline IV fluids.  Suspect most likely secondary to  fluid losses. -Monitor intake and output -Check bladder scan -Check urine sodium and creatinine -On lactated Ringer's at 125 mL/h -Recheck kidney function in a.m.  Chronic pancreatitis Pancreatic pseudocyst Patient has a history of chronic pancreatitis related to history of alcohol abuse. -Continue Creon -Follow-up MRCP -Formally consult [facility] if needed based off findings  Paroxysmal atrial fibrillation Patient appears to be in sinus rhythm at this time -Continue metoprolol  Metabolic acidosis with elevated anion gap Acute.  On admission CO2 was 13 with anion gap of 19.  Glucose was noted to be within normal limits at 87.  Possibly related with patient's history of drinking alcohol. -Continue to monitor  Alcohol abuse Chronic.  Patient reported drinking about half a pint of vodka as well as 15 twelve ounce beers over the last 2 days, but had stopped temporarily. -CIWA protocols initiated with Ativan IV as needed  Cocaine abuse Patient reports recently using cocaine approximately 5 days ago. -Continue to counsel need of cessation of cocaine use  Tobacco abuse Patient reports smoking a pack of cigarettes per day on average. -Nicotine patch offered -Continue to counsel need of cessation of tobacco use  History of Wolff-Parkinson-White Status post ablation  GERD History of PUD -Continue Protonix and Carafate  DVT prophylaxis: Lovenox Advance Care Planning:   Code Status: Full Code    Consults: None  Family Communication: Patient's mother updated over the phone  Severity of Illness: The appropriate patient status for this patient is OBSERVATION. Observation status is judged to be reasonable and necessary in order to provide the required intensity of service to ensure the patient's safety. The patient's presenting symptoms, physical exam findings, and initial radiographic and laboratory data in the context of their medical condition is felt to place them  at decreased risk for further clinical deterioration. Furthermore, it is anticipated that the patient will be medically stable for discharge from the hospital within 2 midnights of admission.   Author: Clydie Braun, MD 02/08/2023 7:14 AM  For on call review [email or web address].

## 2023-02-08 NOTE — Plan of Care (Signed)
Pt understanding of current plan of care

## 2023-02-08 NOTE — ED Notes (Signed)
ED TO INPATIENT HANDOFF REPORT  ED Nurse Name and Phone #: Kepler Mccabe [phone]  S Name/Age/Gender [name] 52 y.o. male Room/Bed: H018C/H018C  Code Status   Code Status: Full Code  Home/SNF/Other Home Patient oriented to: self, place, time, and situation Is this baseline? Yes   Triage Complete: Triage complete  Chief Complaint Abdominal pain [R10.9]  Triage Note Pt states he started having abd pain x3 hours ago after drinking alcohol. 2 40oz. Pt has a history of pancreatitis.   Allergies Allergies  Allergen Reactions   Robaxin [Methocarbamol] Other (See Comments)    jumpy limbs   Aspirin Other (See Comments)    Unknown reaction   Shellfish-Derived Products Nausea And Vomiting and Rash   Trazodone And Nefazodone Other (See Comments)    Muscle spasms   Adhesive [Tape] Itching   Latex Itching   Toradol [Ketorolac Tromethamine] Other (See Comments)    Has ulcers; cannot have this   Contrast Media [Iodinated Contrast Media] Hives   Reglan [Metoclopramide] Other (See Comments)    Muscle spasms   Salmon [Fish Oil] Nausea And Vomiting and Rash    Level of Care/Admitting Diagnosis ED Disposition     ED Disposition  Admit   Condition  --   Comment  Hospital Area: [facility] [[id number]]  Level of Care: Telemetry Medical [104]  May place patient in observation at [facility] or [facility] if equivalent level of care is available:: No  Covid Evaluation: Asymptomatic - no recent exposure (last 10 days) testing not required  Diagnosis: Abdominal pain [578469]  Admitting Physician: Clydie Braun [[id number]]  Attending Physician: Clydie Braun [[id number]]          B Medical/Surgery History Past Medical History:  Diagnosis Date   Alcoholism /alcohol abuse    Anemia    Anxiety    Arthritis    knees; arms; elbows (03/26/2015)   Asthma    Bipolar disorder (HCC)    Chronic bronchitis (HCC)    Chronic lower back pain    Chronic  pancreatitis (HCC)    Cocaine abuse (HCC)    Depression    Family history of adverse reaction to anesthesia    Femoral condyle fracture (HCC) 03/08/2014   left medial/notes 03/09/2014   GERD (gastroesophageal reflux disease)    H/O hiatal hernia    H/O suicide attempt 10/2012   High cholesterol    History of blood transfusion 10/2012   when I tried to commit suicide   History of stomach ulcers    Hypertension    Marijuana abuse, continuous    Migraine    a few times/year (03/26/2015)   Pancreatitis    Pneumonia 1990's X 3   PTSD (post-traumatic stress disorder)    Seizures (HCC)    Sickle cell trait (HCC)    WPW (Wolff-Parkinson-White syndrome)    [name] 03/06/2013   Past Surgical History:  Procedure Laterality Date   BIOPSY  11/25/2017   Procedure: BIOPSY;  Surgeon: [name] Modena, MD;  Location: [facility] ENDOSCOPY;  Service: Endoscopy;;   BIOPSY  10/14/2018   Procedure: BIOPSY;  Surgeon: [name] Modena, MD;  Location: [facility] ENDOSCOPY;  Service: Endoscopy;;   CARDIAC CATHETERIZATION     CYST ENTEROSTOMY  01/02/2020   Procedure: CYST ASPIRATION;  Surgeon: Rachael Fee, MD;  Location: [facility] ENDOSCOPY;  Service: Endoscopy;;   ESOPHAGOGASTRODUODENOSCOPY (EGD) WITH PROPOFOL N/A 11/25/2017   Procedure: ESOPHAGOGASTRODUODENOSCOPY (EGD) WITH PROPOFOL;  Surgeon: [name] Modena, MD;  Location: [facility];  Service: Endoscopy;  Laterality: N/A;   ESOPHAGOGASTRODUODENOSCOPY (EGD) WITH PROPOFOL Left 10/14/2018   Procedure: ESOPHAGOGASTRODUODENOSCOPY (EGD) WITH PROPOFOL;  Surgeon: [name] Modena, MD;  Location: [facility] ENDOSCOPY;  Service: Endoscopy;  Laterality: Left;   ESOPHAGOGASTRODUODENOSCOPY (EGD) WITH PROPOFOL N/A 11/14/2018   Procedure: ESOPHAGOGASTRODUODENOSCOPY (EGD) WITH PROPOFOL;  Surgeon: Carman Ching, MD;  Location: [facility] ENDOSCOPY;  Service: Gastroenterology;  Laterality: N/A;   ESOPHAGOGASTRODUODENOSCOPY (EGD) WITH PROPOFOL N/A 01/02/2020   Procedure: ESOPHAGOGASTRODUODENOSCOPY (EGD) WITH  PROPOFOL;  Surgeon: Rachael Fee, MD;  Location: [facility];  Service: Endoscopy;  Laterality: N/A;   ESOPHAGOGASTRODUODENOSCOPY (EGD) WITH PROPOFOL N/A 10/25/2020   Procedure: ESOPHAGOGASTRODUODENOSCOPY (EGD) WITH PROPOFOL;  Surgeon: Meridee Score Netty Starring., MD;  Location: [facility] ENDOSCOPY;  Service: Gastroenterology;  Laterality: N/A;   EUS N/A 01/02/2020   Procedure: UPPER ENDOSCOPIC ULTRASOUND (EUS) RADIAL;  Surgeon: Rachael Fee, MD;  Location: [facility] ENDOSCOPY;  Service: Endoscopy;  Laterality: N/A;   EYE SURGERY Left 1990's   "result of trauma"    FACIAL FRACTURE SURGERY Left 1990's   "result of trauma"    FLEXIBLE SIGMOIDOSCOPY N/A 10/25/2020   Procedure: FLEXIBLE SIGMOIDOSCOPY;  Surgeon: Lemar Lofty., MD;  Location: [facility] ENDOSCOPY;  Service: Gastroenterology;  Laterality: N/A;   FRACTURE SURGERY     HEMOSTASIS CLIP PLACEMENT  10/25/2020   Procedure: HEMOSTASIS CLIP PLACEMENT;  Surgeon: Lemar Lofty., MD;  Location: [facility] ENDOSCOPY;  Service: Gastroenterology;;   HERNIA REPAIR     LEFT HEART CATHETERIZATION WITH CORONARY ANGIOGRAM Right 03/07/2013   Procedure: LEFT HEART CATHETERIZATION WITH CORONARY ANGIOGRAM;  Surgeon: Ricki Rodriguez, MD;  Location: [facility] CATH LAB;  Service: Cardiovascular;  Laterality: Right;   UMBILICAL HERNIA REPAIR     UPPER GASTROINTESTINAL ENDOSCOPY       A IV Location/Drains/Wounds Patient Lines/Drains/Airways Status     Active Line/Drains/Airways     Name Placement date Placement time Site Days   Peripheral IV 02/08/23 20 G Anterior;Distal;Right Wrist 02/08/23  0653  Wrist  less than 1            Intake/Output Last 24 hours No intake or output data in the 24 hours ending 02/08/23 0816  Labs/Imaging Results for orders placed or performed during the hospital encounter of 02/07/23 (from the past 48 hour(s))  Lipase, blood     Status: None   Collection Time: 02/07/23 11:43 PM  Result Value Ref Range   Lipase 31 11 - 51 U/L     Comment: Performed at [facility], [address], [place], [place] [address]  Comprehensive metabolic panel     Status: Abnormal   Collection Time: 02/07/23 11:43 PM  Result Value Ref Range   Sodium 138 135 - 145 mmol/L   Potassium 3.9 3.5 - 5.1 mmol/L   Chloride 106 98 - 111 mmol/L   CO2 13 (L) 22 - 32 mmol/L   Glucose, Bld 87 70 - 99 mg/dL    Comment: Glucose reference range applies only to samples taken after fasting for at least 8 hours.   BUN 9 6 - 20 mg/dL   Creatinine, Ser 1.91 (H) 0.61 - 1.24 mg/dL   Calcium 8.8 (L) 8.9 - 10.3 mg/dL   Total Protein 8.5 (H) 6.5 - 8.1 g/dL   Albumin 4.5 3.5 - 5.0 g/dL   AST 46 (H) 15 - 41 U/L   ALT 36 0 - 44 U/L   Alkaline Phosphatase 113 38 - 126 U/L   Total Bilirubin 0.8 0.3 - 1.2 mg/dL  GFR, Estimated 37 (L) >60 mL/min    Comment: (NOTE) Calculated using the CKD-EPI Creatinine Equation (2021)    Anion gap 19 (H) 5 - 15    Comment: Performed at [facility], [address], [place], [place] [address]  CBC     Status: Abnormal   Collection Time: 02/07/23 11:43 PM  Result Value Ref Range   WBC 12.9 (H) 4.0 - 10.5 K/uL   RBC 4.16 (L) 4.22 - 5.81 MIL/uL   Hemoglobin 10.7 (L) 13.0 - 17.0 g/dL   HCT 60.4 (L) 54.0 - 98.1 %   MCV 86.3 80.0 - 100.0 fL   MCH 25.7 (L) 26.0 - 34.0 pg   MCHC 29.8 (L) 30.0 - 36.0 g/dL   RDW 19.1 (H) 47.8 - 29.5 %   Platelets 202 150 - 400 K/uL   nRBC 0.2 0.0 - 0.2 %    Comment: Performed at [facility], [address], [place], [place] [address]  Ethanol     Status: Abnormal   Collection Time: 02/07/23 11:43 PM  Result Value Ref Range   Alcohol, Ethyl (B) 121 (H) <10 mg/dL    Comment: (NOTE) Lowest detectable limit for serum alcohol is 10 mg/dL.  For medical purposes only. Performed at [facility], [address], [place], [place] [address]   Magnesium     Status: Abnormal   Collection Time: 02/07/23 11:43 PM  Result Value Ref Range   Magnesium 1.4 (L) 1.7 - 2.4 mg/dL     Comment: Performed at [facility], [address] [place], [place] [address]  Phosphorus     Status: Abnormal   Collection Time: 02/07/23 11:43 PM  Result Value Ref Range   Phosphorus 5.5 (H) 2.5 - 4.6 mg/dL    Comment: Performed at [facility], [address] [place], [place] [address]  Urinalysis, Routine w reflex microscopic -Urine, Clean Catch     Status: Abnormal   Collection Time: 02/07/23 11:49 PM  Result Value Ref Range   Color, Urine YELLOW YELLOW   APPearance HAZY (A) CLEAR   Specific Gravity, Urine 1.009 1.005 - 1.030   pH 5.0 5.0 - 8.0   Glucose, UA NEGATIVE NEGATIVE mg/dL   Hgb urine dipstick NEGATIVE NEGATIVE   Bilirubin Urine NEGATIVE NEGATIVE   Ketones, ur NEGATIVE NEGATIVE mg/dL   Protein, ur 30 (A) NEGATIVE mg/dL   Nitrite NEGATIVE NEGATIVE   Leukocytes,Ua NEGATIVE NEGATIVE   RBC / HPF 0-5 0 - 5 RBC/hpf   WBC, UA 0-5 0 - 5 WBC/hpf   Bacteria, UA NONE SEEN NONE SEEN   Squamous Epithelial / HPF 0-5 0 - 5 /HPF   Mucus PRESENT    Hyaline Casts, UA PRESENT     Comment: Performed at [facility], [address] [place], [place] [address]   No results found.  Pending Labs Unresulted Labs (From admission, onward)     Start     Ordered   02/09/23 0500  CBC  Tomorrow morning,   R        02/08/23 0746   02/09/23 0500  Comprehensive metabolic panel  Tomorrow morning,   R        02/08/23 0746   02/08/23 0755  Protime-INR  Once,   R        02/08/23 0755            Vitals/Pain Today's Vitals   02/07/23 2325 02/07/23 2325 02/08/23 0102  02/08/23 0700  BP:  (!) 157/105 (!) 158/97 (!) 169/107  Pulse:  (!) 104 (!) 47 (!) 107  Resp:  20 20 19   Temp:  (!) 97.4 F (36.3 C) 97.7 F (36.5 C) 98.2 F (36.8 C)  TempSrc:    Oral  SpO2:  100% 95% 100%  Weight: 54.9 kg     Height: 5\' 8"  (1.727 m)       Isolation Precautions No active isolations  Medications Medications  enoxaparin (LOVENOX) injection 40 mg (has no administration in time  range)  sodium chloride flush (NS) 0.9 % injection 3 mL (has no administration in time range)  acetaminophen (TYLENOL) tablet 650 mg (has no administration in time range)    Or  acetaminophen (TYLENOL) suppository 650 mg (has no administration in time range)  ondansetron (ZOFRAN) tablet 4 mg (has no administration in time range)    Or  ondansetron (ZOFRAN) injection 4 mg (has no administration in time range)  albuterol (PROVENTIL) (2.5 MG/3ML) 0.083% nebulizer solution 2.5 mg (has no administration in time range)  lactated ringers infusion (has no administration in time range)  hydrALAZINE (APRESOLINE) injection 10 mg (has no administration in time range)  LORazepam (ATIVAN) tablet 1-4 mg (has no administration in time range)    Or  LORazepam (ATIVAN) injection 1-4 mg (has no administration in time range)  thiamine (VITAMIN B1) tablet 100 mg (has no administration in time range)    Or  thiamine (VITAMIN B1) injection 100 mg (has no administration in time range)  folic acid (FOLVITE) tablet 1 mg (has no administration in time range)  multivitamin with minerals tablet 1 tablet (has no administration in time range)  pantoprazole (PROTONIX) injection 40 mg (has no administration in time range)  morphine (PF) 2 MG/ML injection 2 mg (has no administration in time range)  lactated ringers bolus 1,000 mL (1,000 mLs Intravenous New Bag/Given 02/08/23 0654)  morphine (PF) 2 MG/ML injection 2 mg (2 mg Intravenous Given 02/08/23 0654)  ondansetron (ZOFRAN) injection 4 mg (4 mg Intravenous Given 02/08/23 0653)  morphine (PF) 2 MG/ML injection 2 mg (2 mg Intravenous Given 02/08/23 0743)    Mobility walks     Focused Assessments History of Present Illness   Patient Identification [name] is a 52 y.o. male.  Patient information was obtained from patient. History/Exam limitations: none. Patient presented to the Emergency Department by private vehicle.  Chief Complaint  Abdominal  Pain   Past Medical History:  Diagnosis Date   Alcoholism /alcohol abuse    Anemia    Anxiety    Arthritis    knees; arms; elbows (03/26/2015)   Asthma    Bipolar disorder (HCC)    Chronic bronchitis (HCC)    Chronic lower back pain    Chronic pancreatitis (HCC)    Cocaine abuse (HCC)    Depression    Family history of adverse reaction to anesthesia    Femoral condyle fracture (HCC) 03/08/2014   left medial/notes 03/09/2014   GERD (gastroesophageal reflux disease)    H/O hiatal hernia    H/O suicide attempt 10/2012   High cholesterol    History of blood transfusion 10/2012   when I tried to commit suicide   History of stomach ulcers    Hypertension    Marijuana abuse, continuous    Migraine    a few times/year (03/26/2015)   Pancreatitis    Pneumonia 1990's X 3   PTSD (post-traumatic stress disorder)    Seizures (HCC)  Sickle cell trait (HCC)    WPW (Wolff-Parkinson-White syndrome)    [name] 03/06/2013   Family History  Problem Relation Age of Onset   Hypertension Mother    Cirrhosis Father    Alcoholism Father    Hypertension Father    Melanoma Father    Hypertension Other    Coronary artery disease Other   ([address]) Current Facility-Administered Medications  Medication Dose Route Frequency Provider Last Rate Last Admin   acetaminophen (TYLENOL) tablet 650 mg  650 mg Oral Q6H PRN Clydie Braun, MD       Or   acetaminophen (TYLENOL) suppository 650 mg  650 mg Rectal Q6H PRN Madelyn Flavors A, MD       albuterol (PROVENTIL) (2.5 MG/3ML) 0.083% nebulizer solution 2.5 mg  2.5 mg Nebulization Q6H PRN Katrinka Blazing, Rondell A, MD       enoxaparin (LOVENOX) injection 40 mg  40 mg Subcutaneous Q24H Smith, Rondell A, MD       folic acid (FOLVITE) tablet 1 mg  1 mg Oral Daily Smith, Rondell A, MD       hydrALAZINE (APRESOLINE) injection 10 mg  10 mg Intravenous Q4H PRN Katrinka Blazing, Rondell A, MD       lactated ringers infusion   Intravenous Continuous Smith, Rondell A, MD        LORazepam (ATIVAN) tablet 1-4 mg  1-4 mg Oral Q1H PRN Clydie Braun, MD       Or   LORazepam (ATIVAN) injection 1-4 mg  1-4 mg Intravenous Q1H PRN Smith, Rondell A, MD       morphine (PF) 2 MG/ML injection 2 mg  2 mg Intravenous Q2H PRN Clydie Braun, MD       multivitamin with minerals tablet 1 tablet  1 tablet Oral Daily Smith, Rondell A, MD       ondansetron (ZOFRAN) tablet 4 mg  4 mg Oral Q6H PRN Madelyn Flavors A, MD       Or   ondansetron (ZOFRAN) injection 4 mg  4 mg Intravenous Q6H PRN Smith, Rondell A, MD       pantoprazole (PROTONIX) injection 40 mg  40 mg Intravenous Q12H Smith, Rondell A, MD       sodium chloride flush (NS) 0.9 % injection 3 mL  3 mL Intravenous Q12H Smith, Rondell A, MD       thiamine (VITAMIN B1) tablet 100 mg  100 mg Oral Daily Smith, Rondell A, MD       Or   thiamine (VITAMIN B1) injection 100 mg  100 mg Intravenous Daily Madelyn Flavors A, MD       Current Outpatient Medications  Medication Sig Dispense Refill   famotidine (PEPCID) 20 MG tablet Take 20 mg by mouth 2 (two) times daily.     fluticasone (FLONASE) 50 MCG/ACT nasal spray Place 2 sprays into both nostrils 2 (two) times daily as needed for allergies.     folic acid (FOLVITE) 1 MG tablet Take 1 tablet (1 mg total) by mouth daily. 30 tablet 0   lidocaine (XYLOCAINE) 2 % solution Use as directed 15 mLs in the mouth or throat 2 (two) times daily as needed for mouth pain.     lipase/protease/amylase (CREON) 36000 UNITS CPEP capsule Take 2 capsules (72,000 Units total) by mouth 3 (three) times daily with meals. May also take 1 capsule (36,000 Units total) as needed (with snacks). 240 capsule 0   metoprolol (TOPROL-XL) 200 MG 24 hr tablet Take 200 mg by  mouth daily.     nitroGLYCERIN (NITROSTAT) 0.4 MG SL tablet Place 0.4 mg under the tongue every 5 (five) minutes as needed for chest pain.     ondansetron (ZOFRAN-ODT) 4 MG disintegrating tablet Take 1 tablet (4 mg total) by mouth 2 (two) times daily as  needed for nausea or vomiting. (Patient taking differently: Take 4 mg by mouth as needed for nausea or vomiting.) 30 tablet 0   oxyCODONE (OXY IR/ROXICODONE) 5 MG immediate release tablet Take 1 tablet (5 mg total) by mouth every 4 (four) hours as needed for severe pain. (Patient taking differently: Take 5 mg by mouth as needed for severe pain.) 20 tablet 0   pantoprazole (PROTONIX) 40 MG tablet Take 1 tablet (40 mg total) by mouth daily. (Patient taking differently: Take 40 mg by mouth 2 (two) times daily.) 30 tablet 0   sucralfate (CARAFATE) 1 GM/10ML suspension Take 10 mLs (1 g total) by mouth See admin instructions. Take 4 times with meals and at bedtime. (Patient taking differently: Take 1 g by mouth See admin instructions. Take 1g by mouth three times a day with meals.  May take an additional 1g with a snack.) 130 mL 4   lidocaine (XYLOCAINE) 5 % ointment Apply 1 Application topically as needed for mild pain. (Patient not taking: Reported on 02/08/2023) 30 g 0   pregabalin (LYRICA) 100 MG capsule Take 1 capsule (100 mg total) by mouth 2 (two) times daily. (Patient not taking: Reported on 02/08/2023) 60 capsule 3   Allergies  Allergen Reactions   Robaxin [Methocarbamol] Other (See Comments)    jumpy limbs   Aspirin Other (See Comments)    Unknown reaction   Shellfish-Derived Products Nausea And Vomiting and Rash   Trazodone And Nefazodone Other (See Comments)    Muscle spasms   Adhesive [Tape] Itching   Latex Itching   Toradol [Ketorolac Tromethamine] Other (See Comments)    Has ulcers; cannot have this   Contrast Media [Iodinated Contrast Media] Hives   Reglan [Metoclopramide] Other (See Comments)    Muscle spasms   Salmon [Fish Oil] Nausea And Vomiting and Rash   Social History   Socioeconomic History   Marital status: Single    Spouse name: Not on file   Number of children: Not on file   Years of education: Not on file   Highest education level: Not on file  Occupational  History   Occupation: disabled  Tobacco Use   Smoking status: Every Day    Current packs/day: 1.00    Average packs/day: 1 pack/day for 36.0 years (36.0 ttl pk-yrs)    Types: Cigarettes, E-cigarettes   Smokeless tobacco: Never  Vaping Use   Vaping status: Former  Substance and Sexual Activity   Alcohol use: Yes    Alcohol/week: 4.0 standard drinks of alcohol    Types: 4 Cans of beer per week    Comment: today 2, 40 oz   Drug use: Not Currently    Types: Marijuana, Cocaine   Sexual activity: Yes    Birth control/protection: None  Other Topics Concern   Not on file  Social History Narrative   ** Merged History Encounter **       Social Determinants of Health   Financial Resource Strain: Not on file  Food Insecurity: No Food Insecurity (01/25/2023)   Hunger Vital Sign    Worried About Running Out of Food in the Last Year: Never true    Ran Out of Food in the Last  Year: Never true  Transportation Needs: No Transportation Needs (01/25/2023)   PRAPARE - Administrator, Civil Service (Medical): No    Lack of Transportation (Non-Medical): No  Physical Activity: Not on file  Stress: Not on file  Social Connections: Not on file  Intimate Partner Violence: Not At Risk (01/25/2023)   Humiliation, Afraid, Rape, and Kick questionnaire    Fear of Current or Ex-Partner: No    Emotionally Abused: No    Physically Abused: No    Sexually Abused: No   Review of Systems Pertinent items are noted in HPI.     R Recommendations: See Admitting Provider Note  Report given to:   Additional Notes:

## 2023-02-09 DIAGNOSIS — K863 Pseudocyst of pancreas: Secondary | ICD-10-CM | POA: Diagnosis present

## 2023-02-09 DIAGNOSIS — Z23 Encounter for immunization: Secondary | ICD-10-CM | POA: Diagnosis not present

## 2023-02-09 DIAGNOSIS — E7800 Pure hypercholesterolemia, unspecified: Secondary | ICD-10-CM | POA: Diagnosis present

## 2023-02-09 DIAGNOSIS — E440 Moderate protein-calorie malnutrition: Secondary | ICD-10-CM | POA: Diagnosis present

## 2023-02-09 DIAGNOSIS — D573 Sickle-cell trait: Secondary | ICD-10-CM | POA: Diagnosis present

## 2023-02-09 DIAGNOSIS — Z79899 Other long term (current) drug therapy: Secondary | ICD-10-CM | POA: Diagnosis not present

## 2023-02-09 DIAGNOSIS — F1010 Alcohol abuse, uncomplicated: Secondary | ICD-10-CM | POA: Diagnosis present

## 2023-02-09 DIAGNOSIS — E875 Hyperkalemia: Secondary | ICD-10-CM | POA: Diagnosis present

## 2023-02-09 DIAGNOSIS — N179 Acute kidney failure, unspecified: Secondary | ICD-10-CM | POA: Diagnosis present

## 2023-02-09 DIAGNOSIS — D72829 Elevated white blood cell count, unspecified: Secondary | ICD-10-CM | POA: Diagnosis present

## 2023-02-09 DIAGNOSIS — G8929 Other chronic pain: Secondary | ICD-10-CM | POA: Diagnosis present

## 2023-02-09 DIAGNOSIS — M545 Low back pain, unspecified: Secondary | ICD-10-CM | POA: Diagnosis present

## 2023-02-09 DIAGNOSIS — E872 Acidosis, unspecified: Secondary | ICD-10-CM | POA: Diagnosis present

## 2023-02-09 DIAGNOSIS — R109 Unspecified abdominal pain: Secondary | ICD-10-CM | POA: Diagnosis not present

## 2023-02-09 DIAGNOSIS — R1033 Periumbilical pain: Secondary | ICD-10-CM | POA: Diagnosis present

## 2023-02-09 DIAGNOSIS — Z681 Body mass index (BMI) 19 or less, adult: Secondary | ICD-10-CM | POA: Diagnosis not present

## 2023-02-09 DIAGNOSIS — K861 Other chronic pancreatitis: Secondary | ICD-10-CM | POA: Diagnosis not present

## 2023-02-09 DIAGNOSIS — R52 Pain, unspecified: Secondary | ICD-10-CM | POA: Diagnosis present

## 2023-02-09 DIAGNOSIS — E860 Dehydration: Secondary | ICD-10-CM | POA: Diagnosis present

## 2023-02-09 DIAGNOSIS — I480 Paroxysmal atrial fibrillation: Secondary | ICD-10-CM | POA: Diagnosis present

## 2023-02-09 DIAGNOSIS — F17210 Nicotine dependence, cigarettes, uncomplicated: Secondary | ICD-10-CM | POA: Diagnosis present

## 2023-02-09 DIAGNOSIS — I10 Essential (primary) hypertension: Secondary | ICD-10-CM | POA: Diagnosis present

## 2023-02-09 DIAGNOSIS — F4310 Post-traumatic stress disorder, unspecified: Secondary | ICD-10-CM | POA: Diagnosis present

## 2023-02-09 DIAGNOSIS — F1410 Cocaine abuse, uncomplicated: Secondary | ICD-10-CM | POA: Diagnosis present

## 2023-02-09 DIAGNOSIS — K219 Gastro-esophageal reflux disease without esophagitis: Secondary | ICD-10-CM | POA: Diagnosis present

## 2023-02-09 DIAGNOSIS — F319 Bipolar disorder, unspecified: Secondary | ICD-10-CM | POA: Diagnosis present

## 2023-02-09 LAB — COMPREHENSIVE METABOLIC PANEL
ALT: 28 U/L (ref 0–44)
AST: 38 U/L (ref 15–41)
Albumin: 3.7 g/dL (ref 3.5–5.0)
Alkaline Phosphatase: 93 U/L (ref 38–126)
Anion gap: 11 (ref 5–15)
BUN: 9 mg/dL (ref 6–20)
CO2: 14 mmol/L — ABNORMAL LOW (ref 22–32)
Calcium: 8.2 mg/dL — ABNORMAL LOW (ref 8.9–10.3)
Chloride: 111 mmol/L (ref 98–111)
Creatinine, Ser: 1.17 mg/dL (ref 0.61–1.24)
GFR, Estimated: >60 mL/min (ref >60)
Glucose, Bld: 107 mg/dL — ABNORMAL HIGH (ref 70–99)
Potassium: 4.7 mmol/L (ref 3.5–5.1)
Sodium: 136 mmol/L (ref 135–145)
Total Bilirubin: 1 mg/dL (ref 0.3–1.2)
Total Protein: 7 g/dL (ref 6.5–8.1)

## 2023-02-09 LAB — CBC
HCT: 30.1 % — ABNORMAL LOW (ref 39.0–52.0)
Hemoglobin: 9.1 g/dL — ABNORMAL LOW (ref 13.0–17.0)
MCH: 25.9 pg — ABNORMAL LOW (ref 26.0–34.0)
MCHC: 30.2 g/dL (ref 30.0–36.0)
MCV: 85.5 fL (ref 80.0–100.0)
Platelets: 164 10*3/uL (ref 150–400)
RBC: 3.52 MIL/uL — ABNORMAL LOW (ref 4.22–5.81)
RDW: 19.1 % — ABNORMAL HIGH (ref 11.5–15.5)
WBC: 7.9 10*3/uL (ref 4.0–10.5)
nRBC: 0.3 % — ABNORMAL HIGH (ref 0.0–0.2)

## 2023-02-09 MED ORDER — OXYCODONE HCL 5 MG PO TABS
5.0000 mg | ORAL_TABLET | ORAL | Status: AC | PRN
  Administered 2023-02-10 (×3): 5 mg via ORAL
  Filled 2023-02-09 (×3): qty 1

## 2023-02-09 NOTE — Progress Notes (Addendum)
PROGRESS NOTE  [name]  DOB: 07-13-1970  PCP: Aviva Kluver QMV:[id number]  DOA: 02/07/2023  LOS: 0 days  Hospital Day: 3  Brief narrative: [name] is a 52 y.o. male with PMH significant for chronic alcoholism, chronic pancreatitis with pancreatic pseudocyst, polysubstance abuse (tobacco, marijuana, cocaine), HTN, paroxysmal A-fib not on anticoagulation, WPW syndrome s/p ablation, GERD, bipolar disorder, anxiety/depression, h/o suicidal ideation. Recently hospitalized 9/17 to 9/19 for intractable nausea, vomiting diarrhea suspected to be due to marijuana use. 10/1, he presented to the ED with complaint of abdominal pain and also with nausea, vomiting and diarrhea.  Patient continues to drink and drank about half a pill of vodka and more than a dozen 12 ounce beers in a span of 48 hours prior to presentation.  Also did cocaine in the last few days  In the ED, initial heart rate as low as 47, blood pressure 150s, breathing on room air Labs with WC of 12.9, hemoglobin 10.7, serum bicarb low at 13, anion gap elevated BUN/creatinine 9/2.09, magnesium low at 1.4, lipase level not elevated. Patient was given IV fluid, IV morphine, IV Zofran Kept in observation to [facility]  Subjective: Patient was seen and examined this morning. Middle-aged [name].  Sitting up in bed.  Seems anxious about pseudocyst.  Continues to ask, ' do I have a cancer' Chart reviewed. No fever, heart rate in 60s and 70s, blood pressure 140s to 150s overnight, breathing on room air  Assessment and plan: Intractable abdominal pain Chronic pancreatitis and pancreatic pseudocyst Presented with intractable abdominal pain, nausea, vomiting, diarrhea after heavy drinking for 2 days. Lipase was normal  MRCP showed moderate-sized pseudocyst in the mid body of the pancreas with upstream ductal ectasia and cystic change consistent with sequelae of prior pancreatitis.  No evidence of acute  pancreatitis.  Normal biliary tree.  No enhancing liver lesion. Currently on clear liquid diet.  I would continue the same for today and slowly advance Continue IV fluid, IV antiemetic Continue as needed IV/oral pain meds Continue Creon Follows up with [facility] as an outpatient Recent Labs  Lab 02/07/23 2343  LIPASE 31    Leukocytosis Likely stress reaction.  No evidence of infection.  WBC count normal this morning Recent Labs  Lab 02/05/23 2229 02/07/23 2343 02/08/23 1604 02/09/23 0452  WBC 6.7 12.9* 8.6 7.9   Acute kidney injury Acute anion gap metabolic acidosis Creatinine normal at baseline.  Present with creatinine elevated to 2.09 likely prerenal due to GI loss.  Serum bicarb was low and anion gap was wide.  It could be due to excess alcohol and lactic acid and blood.  However neither blood alcohol ever not lactic acid level were checked on presentation. With IV hydration, creatinine and anion gap have normalized.  Serum bicarbonate remains low.  Continue to monitor. Recent Labs    01/08/23 1838 01/09/23 0609 01/10/23 1343 01/11/23 0912 01/16/23 0415 01/24/23 2341 01/25/23 2231 01/26/23 0313 02/05/23 2229 02/07/23 2343 02/09/23 0452  BUN 9 7 <5* <5* 12 14  --  5* 11 9 9   CREATININE 0.88 0.80 0.67 0.75 1.08 0.92 0.63 0.72 1.32* 2.09* 1.17  CO2 20* 21* 19* 20* 21* 16*  --  18* 16* 13* 14*   Chronic alcohol use High risk of alcohol withdrawal Continues to drink alcohol despite chronic pancreatitis. Counseled to quit. CIWA protocol initiated  Polysubstance abuse (tobacco, cocaine, marijuana, alcohol) Counseled to quit  Paroxysmal atrial fibrillation H/o WPW syndrome s/p ablation Patient appears  to be in sinus rhythm at this time Continue metoprolol    GERD H/o PUD Continue Protonix and Carafate   Mobility: Encourage ambulation  Goals of care   Code Status: Full Code     DVT prophylaxis:  enoxaparin (LOVENOX) injection 40 mg Start: 02/08/23  2200   Antimicrobials: None Fluid: Continue LR at 125 mL/h Consultants: None Family Communication: None at bedside  Status: Observation Level of care:  Telemetry Medical   Patient is from: Home Needs to continue in-hospital care: Continues to have significant pain and intolerance to oral intake.  Requires IV fluid and IV pain medicines Anticipated d/c to: Hopefully home in 1 to 2 days   Diet:  Diet Order             Diet clear liquid Room service appropriate? Yes; Fluid consistency: Thin  Diet effective now                   Scheduled Meds:  enoxaparin (LOVENOX) injection  40 mg Subcutaneous Q24H   folic acid  1 mg Oral Daily   lipase/protease/amylase  72,000 Units Oral TID WC   metoprolol  200 mg Oral Daily   multivitamin with minerals  1 tablet Oral Daily   nicotine  21 mg Transdermal Daily   pantoprazole (PROTONIX) IV  40 mg Intravenous Q12H   sodium chloride flush  3 mL Intravenous Q12H   sucralfate  1 g Oral TID with meals   thiamine  100 mg Oral Daily   Or   thiamine  100 mg Intravenous Daily    PRN meds: acetaminophen **OR** acetaminophen, albuterol, fluticasone, hydrALAZINE, lidocaine, lipase/protease/amylase **AND** lipase/protease/amylase, LORazepam **OR** LORazepam, morphine injection, ondansetron **OR** ondansetron (ZOFRAN) IV, oxyCODONE   Infusions:   lactated ringers 125 mL/hr at 02/08/23 1006    Antimicrobials: Anti-infectives (From admission, onward)    None       Objective: Vitals:   02/09/23 0937 02/09/23 0948  BP: (!) 154/107   Pulse: 64   Resp: 18   Temp: (!) 97.5 F (36.4 C)   SpO2: (!) 86% 96%    Intake/Output Summary (Last 24 hours) at 02/09/2023 1057 Last data filed at 02/09/2023 0924 Gross per 24 hour  Intake 783 ml  Output 650 ml  Net 133 ml   Filed Weights   02/07/23 2325  Weight: 54.9 kg   Weight change:  Body mass index is 18.4 kg/m.   Physical Exam: General exam: Middle-aged [name] Skin:  No rashes, lesions or ulcers. HEENT: Atraumatic, normocephalic, no obvious bleeding Lungs: Clear to auscultation bilaterally CVS: Regular rate and rhythm, no murmur GI/Abd soft, moderate epigastric tenderness present CNS: Alert, awake, oriented x 3 Psychiatry: Anxious.  Not in alcohol withdrawal at this time Extremities: No pedal edema, no calf tenderness  Data Review: I have personally reviewed the laboratory data and studies available.  F/u labs ordered Unresulted Labs (From admission, onward)     Start     Ordered   02/10/23 0500  Basic metabolic panel  Daily,   R      02/09/23 1055   02/10/23 0500  CBC with Differential/Platelet  Daily,   R      02/09/23 1055   02/08/23 1350  CBC with Differential/Platelet  Once,   AD        02/08/23 1349            Total time spent in review of labs and imaging, patient evaluation, formulation of plan, documentation and  communication with family: 55 minutes  Signed, Lorin Glass, MD [facility] 02/09/2023

## 2023-02-09 NOTE — Plan of Care (Signed)
  Problem: Education: Goal: Knowledge of General Education information will improve Description: Including pain rating scale, medication(s)/side effects and non-pharmacologic comfort measures Outcome: Progressing   Problem: Clinical Measurements: Goal: Will remain free from infection Outcome: Progressing Goal: Diagnostic test results will improve Outcome: Progressing   Problem: Activity: Goal: Risk for activity intolerance will decrease Outcome: Progressing   Problem: Nutrition: Goal: Adequate nutrition will be maintained Outcome: Progressing   Problem: Elimination: Goal: Will not experience complications related to bowel motility Outcome: Progressing Goal: Will not experience complications related to urinary retention Outcome: Progressing   Problem: Safety: Goal: Ability to remain free from injury will improve Outcome: Progressing

## 2023-02-09 NOTE — TOC Initial Note (Addendum)
Transition of Care (TOC) - Initial/Assessment Note   Patient Confirmed face sheet information. Per last TOC discharge note patient had a follow up appointment at [facility] . Patient stated he did no go to appointment and did not call. NCM offered to call to reschedule . Patient wants to go to [facility] for PCP . NCM asked which location and if patient wanted NCM to call to schedule appointment . Patient stated he will schedule PCP appointment .   Substance abuse resources added to AVS   Patient states [facility] has referred him to [facility] but he has not gotten a phone call. NCM will follow up with [facility] . Called [facility] was told Dr Adela Lank and Dr Rhea Belton did not refer patient to a pain management clinic , they want patient's PCP to refer to pain management clinic . Notified patient . Patient would like NCM to schedule follow up appointment in [facility] . Appointment information on AVS and patient aware  Patient Details  Name: [name] MRN: [id number] Date of Birth: 03-Feb-1971  Transition of Care [facility]) CM/SW Contact:    Kingsley Plan, RN Phone Number: 02/09/2023, 12:21 PM  Clinical Narrative:                   Expected Discharge Plan: Home/Self Care Barriers to Discharge: Continued Medical Work up   Patient Goals and CMS Choice Patient states their goals for this hospitalization and ongoing recovery are:: to return to home          Expected Discharge Plan and Services   Discharge Planning Services: CM Consult   Living arrangements for the past 2 months: Single Family Home                 DME Arranged: N/A         HH Arranged: NA          Prior Living Arrangements/Services Living arrangements for the past 2 months: Single Family Home Lives with:: Siblings, Adult Children Patient language and need for interpreter reviewed:: Yes Do you feel safe going back to the place where you live?: Yes      Need for  Family Participation in Patient Care: Yes (Comment) Care giver support system in place?: Yes (comment) Current home services: DME Criminal Activity/Legal Involvement Pertinent to Current Situation/Hospitalization: No - Comment as needed  Activities of Daily Living   ADL Screening (condition at time of admission) Independently performs ADLs?: Yes (appropriate for developmental age) Is the patient deaf or have difficulty hearing?: No Does the patient have difficulty seeing, even when wearing glasses/contacts?: No Does the patient have difficulty concentrating, remembering, or making decisions?: No  Permission Sought/Granted   Permission granted to share information with : No              Emotional Assessment Appearance:: Appears stated age Attitude/Demeanor/Rapport: Engaged Affect (typically observed): Accepting Orientation: : Oriented to Self, Oriented to Place, Oriented to  Time, Oriented to Situation Alcohol / Substance Use: Not Applicable Psych Involvement: No (comment)  Admission diagnosis:  Periumbilical abdominal pain [R10.33] AKI (acute kidney injury) (HCC) [N17.9] Abdominal pain [R10.9] Patient Active Problem List   Diagnosis Date Noted   Metabolic acidosis, increased anion gap 02/08/2023   Cocaine abuse (HCC) 02/08/2023   Adynamic ileus (HCC) 01/25/2023   Cannabinoid hyperemesis syndrome 01/25/2023   Epigastric abdominal pain 01/09/2023   Asthma, chronic 01/09/2023   Ileus (HCC) 01/09/2023   Hypokalemia 09/18/2022  NSVT (nonsustained ventricular tachycardia) (HCC) 01/20/2022   Normal anion gap metabolic acidosis 12/08/2021   Hypoalbuminemia 12/08/2021   Acute recurrent pancreatitis 12/07/2021   WPW (Wolff-Parkinson-White syndrome) 12/06/2021   Paroxysmal atrial fibrillation (HCC) 12/06/2021   Pulmonary nodule 12/06/2021   Leukocytosis 06/07/2021   Epigastric pain 06/07/2021   Acute alcoholic pancreatitis 06/06/2021   Urinary retention    Protein-calorie  malnutrition, severe 11/17/2020   Acute pancreatitis 11/15/2020   Hypomagnesemia    Hypocalcemia 10/23/2020   AKI (acute kidney injury) (HCC) 11/13/2018   Seizure (HCC) 11/13/2018   Chest pain 01/08/2018   Acute on chronic pancreatitis (HCC) 09/28/2017   Abdominal pain 05/27/2017   Tachycardia 03/18/2017   Nausea, vomiting, and diarrhea 03/18/2017   Diarrhea 03/18/2017   GERD (gastroesophageal reflux disease)    Alcohol use disorder 12/05/2016   Normocytic anemia 12/05/2016   Alcohol use disorder, severe, dependence (HCC) 07/25/2016   Cocaine use disorder, severe, dependence (HCC) 07/25/2016   Major depressive disorder, 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   Chronic pancreatitis (HCC) 05/18/2015   Polysubstance abuse (tobacco, cocaine, THC, and ETOH) 03/26/2015   Essential hypertension 02/06/2014   Mood disorder (HCC) 02/06/2014   Pancreatic pseudocyst/cyst 11/25/2013   Phillips Odor White pattern seen on electrocardiogram 10/03/2012   Alcohol abuse 03/23/2007   Tobacco abuse 03/23/2007   PCP:  Pcp, No Pharmacy:   [facility] [address] [place] [address] Phone: [phone] Fax: [phone]  [id number] - [place], [place] - [address] AT [facility] [address] [place] [address] Phone: [phone] Fax: [phone]  [facility] [address] [place] [address] Phone: [phone] Fax: [phone]     Social Determinants of Health (SDOH) Social History: SDOH Screenings   Food Insecurity: No Food Insecurity (02/08/2023)  Housing: Patient Declined (02/08/2023)  Transportation Needs: No Transportation Needs (02/08/2023)  Utilities: Not At Risk (02/08/2023)  Alcohol Screen: Medium Risk (03/14/2017)  Depression (PHQ2-9): Medium Risk (03/27/2020)  Tobacco Use: High Risk (02/07/2023)   SDOH  Interventions:     Readmission Risk Interventions    01/10/2023    4:32 PM 01/02/2023    2:44 PM 12/16/2021    4:07 PM  Readmission Risk Prevention Plan  Transportation Screening Complete Complete Complete  Medication Review (RN Care Manager) Complete Complete Referral to Pharmacy  PCP or Specialist appointment within 3-5 days of discharge Complete Complete Complete  HRI or Home Care Consult Complete Complete Complete  SW Recovery Care/Counseling Consult Complete Complete Complete  Palliative Care Screening Not Applicable Not Applicable Not Applicable  Skilled Nursing Facility Not Applicable Not Applicable Not Applicable

## 2023-02-09 NOTE — Plan of Care (Signed)

## 2023-02-10 DIAGNOSIS — R109 Unspecified abdominal pain: Secondary | ICD-10-CM | POA: Diagnosis not present

## 2023-02-10 LAB — CBC WITH DIFFERENTIAL/PLATELET
Abs Immature Granulocytes: 0.03 10*3/uL (ref 0.00–0.07)
Basophils Absolute: 0 10*3/uL (ref 0.0–0.1)
Basophils Relative: 0 %
Eosinophils Absolute: 0.2 10*3/uL (ref 0.0–0.5)
Eosinophils Relative: 3 %
HCT: 26.9 % — ABNORMAL LOW (ref 39.0–52.0)
Hemoglobin: 8.1 g/dL — ABNORMAL LOW (ref 13.0–17.0)
Immature Granulocytes: 1 %
Lymphocytes Relative: 37 %
Lymphs Abs: 2.4 10*3/uL (ref 0.7–4.0)
MCH: 27 pg (ref 26.0–34.0)
MCHC: 30.1 g/dL (ref 30.0–36.0)
MCV: 89.7 fL (ref 80.0–100.0)
Monocytes Absolute: 0.7 10*3/uL (ref 0.1–1.0)
Monocytes Relative: 11 %
Neutro Abs: 3.2 10*3/uL (ref 1.7–7.7)
Neutrophils Relative %: 48 %
Platelets: 145 10*3/uL — ABNORMAL LOW (ref 150–400)
RBC: 3 MIL/uL — ABNORMAL LOW (ref 4.22–5.81)
RDW: 19.1 % — ABNORMAL HIGH (ref 11.5–15.5)
WBC: 6.5 10*3/uL (ref 4.0–10.5)
nRBC: 0 % (ref 0.0–0.2)

## 2023-02-10 LAB — BASIC METABOLIC PANEL
Anion gap: 9 (ref 5–15)
BUN: 5 mg/dL — ABNORMAL LOW (ref 6–20)
CO2: 15 mmol/L — ABNORMAL LOW (ref 22–32)
Calcium: 7.8 mg/dL — ABNORMAL LOW (ref 8.9–10.3)
Chloride: 114 mmol/L — ABNORMAL HIGH (ref 98–111)
Creatinine, Ser: 0.83 mg/dL (ref 0.61–1.24)
GFR, Estimated: >60 mL/min (ref >60)
Glucose, Bld: 67 mg/dL — ABNORMAL LOW (ref 70–99)
Potassium: 5.8 mmol/L — ABNORMAL HIGH (ref 3.5–5.1)
Sodium: 138 mmol/L (ref 135–145)

## 2023-02-10 MED ORDER — AMLODIPINE BESYLATE 5 MG PO TABS
5.0000 mg | ORAL_TABLET | Freq: Every day | ORAL | Status: DC
  Administered 2023-02-10 – 2023-02-11 (×2): 5 mg via ORAL
  Filled 2023-02-10 (×2): qty 1

## 2023-02-10 MED ORDER — SODIUM BICARBONATE 650 MG PO TABS
1300.0000 mg | ORAL_TABLET | Freq: Two times a day (BID) | ORAL | Status: DC
  Administered 2023-02-10 – 2023-02-11 (×3): 1300 mg via ORAL
  Filled 2023-02-10 (×3): qty 2

## 2023-02-10 MED ORDER — LOPERAMIDE HCL 2 MG PO CAPS
2.0000 mg | ORAL_CAPSULE | Freq: Four times a day (QID) | ORAL | Status: DC | PRN
  Administered 2023-02-10 – 2023-02-11 (×2): 2 mg via ORAL
  Filled 2023-02-10 (×2): qty 1

## 2023-02-10 MED ORDER — MORPHINE SULFATE (PF) 2 MG/ML IV SOLN
1.0000 mg | INTRAVENOUS | Status: DC | PRN
  Administered 2023-02-10 – 2023-02-11 (×4): 1 mg via INTRAVENOUS
  Filled 2023-02-10 (×4): qty 1

## 2023-02-10 NOTE — Progress Notes (Signed)
PROGRESS NOTE  [name]  DOB: 04/28/1971  PCP: Aviva Kluver WUJ:[id number]  DOA: 02/07/2023  LOS: 1 day  Hospital Day: 4  Brief narrative: [name] is a 52 y.o. male with PMH significant for chronic alcoholism, chronic pancreatitis with pancreatic pseudocyst, polysubstance abuse (tobacco, marijuana, cocaine), HTN, paroxysmal A-fib not on anticoagulation, WPW syndrome s/p ablation, GERD, bipolar disorder, anxiety/depression, h/o suicidal ideation. Recently hospitalized 9/17 to 9/19 for intractable nausea, vomiting diarrhea suspected to be due to marijuana use. 10/1, he presented to the ED with complaint of abdominal pain and also with nausea, vomiting and diarrhea.  Patient continues to drink and drank about half a pill of vodka and more than a dozen 12 ounce beers in a span of 48 hours prior to presentation.  Also did cocaine in the last few days  In the ED, initial heart rate as low as 47, blood pressure 150s, breathing on room air Labs with WC of 12.9, hemoglobin 10.7, serum bicarb low at 13, anion gap elevated BUN/creatinine 9/2.09, magnesium low at 1.4, lipase level not elevated. Patient was given IV fluid, IV morphine, IV Zofran Kept in observation to [facility]  Subjective: Patient was seen and examined this morning. Sitting up in bed.  Continues to have abdominal pain requiring IV and oral pain meds. On liquid diet to but wants to try soft diet today. No withdrawal symptoms noted.  Assessment and plan: Intractable abdominal pain Chronic pancreatitis and pancreatic pseudocyst Presented with intractable abdominal pain, nausea, vomiting, diarrhea after heavy drinking for 2 days. Lipase was normal  MRCP showed moderate-sized pseudocyst in the mid body of the pancreas with upstream ductal ectasia and cystic change consistent with sequelae of prior pancreatitis.  No evidence of acute pancreatitis.  Normal biliary tree.  No enhancing liver lesion. Currently on clear  liquid diet.  Wanted to advance soft diet today. Continue IV fluid, IV antiemetic Continue to have abdominal pain.  Continue as needed IV/oral pain meds Continue Creon.  Add Imodium as needed. Follows up with [facility] as an outpatient Recent Labs  Lab 02/07/23 2343  LIPASE 31    Leukocytosis Likely stress reaction.  No evidence of infection.  WBC count normal this morning Recent Labs  Lab 02/05/23 2229 02/07/23 2343 02/08/23 1604 02/09/23 0452 02/10/23 0424  WBC 6.7 12.9* 8.6 7.9 6.5   Acute kidney injury Acute anion gap metabolic acidosis Creatinine normal at baseline.  Present with creatinine elevated to 2.09 likely prerenal due to GI loss.  Serum bicarb was low and anion gap was wide.  It could be due to excess alcohol and lactic acid and blood.  However neither blood alcohol ever not lactic acid level were checked on presentation. With IV hydration, creatinine and anion gap have normalized.  Serum bicarbonate remains low.  Start sodium bicarb supplement Recent Labs    01/09/23 0609 01/10/23 1343 01/11/23 0912 01/16/23 0415 01/24/23 2341 01/25/23 2231 01/26/23 0313 02/05/23 2229 02/07/23 2343 02/09/23 0452 02/10/23 0424  BUN 7 <5* <5* 12 14  --  5* 11 9 9  5*  CREATININE 0.80 0.67 0.75 1.08 0.92 0.63 0.72 1.32* 2.09* 1.17 0.83  CO2 21* 19* 20* 21* 16*  --  18* 16* 13* 14* 15*   Chronic alcohol use High risk of alcohol withdrawal Continues to drink alcohol despite chronic pancreatitis. Counseled to quit. CIWA protocol initiated  Paroxysmal atrial fibrillation H/o WPW syndrome s/p ablation Patient appears to be in sinus rhythm at this time Continue metoprolol  Hypertension Blood pressure elevated to 150s and 160s despite metoprolol. Add amlodipine 5 mg daily. Continue to monitor blood pressure.  IV hydralazine as needed.    GERD H/o PUD Continue Protonix and Carafate  Polysubstance abuse (tobacco, cocaine, marijuana, alcohol) Counseled to  quit   Mobility: Encourage ambulation  Goals of care   Code Status: Full Code     DVT prophylaxis:  enoxaparin (LOVENOX) injection 40 mg Start: 02/08/23 2200   Antimicrobials: None Fluid: Stop IV fluid today Consultants: None Family Communication: None at bedside  Status: Observation Level of care:  Telemetry Medical   Patient is from: Home Needs to continue in-hospital care: Continues to have significant pain and intolerance to oral intake.  Requires IV and oral pain medicines Anticipated d/c to: Hopefully home in 1 to 2 days   Diet:  Diet Order             DIET SOFT Room service appropriate? Yes; Fluid consistency: Thin  Diet effective now                   Scheduled Meds:  enoxaparin (LOVENOX) injection  40 mg Subcutaneous Q24H   folic acid  1 mg Oral Daily   lipase/protease/amylase  72,000 Units Oral TID WC   metoprolol  200 mg Oral Daily   multivitamin with minerals  1 tablet Oral Daily   nicotine  21 mg Transdermal Daily   pantoprazole (PROTONIX) IV  40 mg Intravenous Q12H   sodium bicarbonate  1,300 mg Oral BID   sodium chloride flush  3 mL Intravenous Q12H   sucralfate  1 g Oral TID with meals   thiamine  100 mg Oral Daily   Or   thiamine  100 mg Intravenous Daily    PRN meds: acetaminophen **OR** acetaminophen, albuterol, fluticasone, hydrALAZINE, lidocaine, lipase/protease/amylase **AND** lipase/protease/amylase, loperamide, LORazepam **OR** LORazepam, morphine injection, ondansetron **OR** ondansetron (ZOFRAN) IV, oxyCODONE   Infusions:   lactated ringers Stopped (02/08/23 1904)    Antimicrobials: Anti-infectives (From admission, onward)    None       Objective: Vitals:   02/10/23 0445 02/10/23 0725  BP: (!) 161/98 (!) 179/97  Pulse: 80 91  Resp: (!) 24 16  Temp: 98 F (36.7 C) 98.1 F (36.7 C)  SpO2: 93% 100%    Intake/Output Summary (Last 24 hours) at 02/10/2023 1415 Last data filed at 02/10/2023 1026 Gross per 24 hour   Intake 3730.05 ml  Output 300 ml  Net 3430.05 ml   Filed Weights   02/07/23 2325  Weight: 54.9 kg   Weight change:  Body mass index is 18.4 kg/m.   Physical Exam: General exam: Middle-aged [name] Skin: No rashes, lesions or ulcers. HEENT: Atraumatic, normocephalic, no obvious bleeding Lungs: Clear to auscultation bilaterally CVS: Regular rate and rhythm, no murmur GI/Abd soft, moderate epigastric tenderness present, bowel sound present CNS: Alert, awake, oriented x 3 Psychiatry: Anxious.  Not in alcohol withdrawal at this time Extremities: No pedal edema, no calf tenderness  Data Review: I have personally reviewed the laboratory data and studies available.  F/u labs ordered Unresulted Labs (From admission, onward)     Start     Ordered   02/10/23 0500  Basic metabolic panel  Daily,   R      02/09/23 1055   02/10/23 0500  CBC with Differential/Platelet  Daily,   R      02/09/23 1055   02/08/23 1350  CBC with Differential/Platelet  Once,   AD  02/08/23 1349            Total time spent in review of labs and imaging, patient evaluation, formulation of plan, documentation and communication with family: 45 minutes  Signed, Lorin Glass, MD [facility] 02/10/2023

## 2023-02-11 ENCOUNTER — Other Ambulatory Visit: Payer: Self-pay | Admitting: Internal Medicine

## 2023-02-11 DIAGNOSIS — R109 Unspecified abdominal pain: Secondary | ICD-10-CM | POA: Diagnosis not present

## 2023-02-11 LAB — CBC WITH DIFFERENTIAL/PLATELET
Abs Immature Granulocytes: 0.06 10*3/uL (ref 0.00–0.07)
Basophils Absolute: 0 10*3/uL (ref 0.0–0.1)
Basophils Relative: 0 %
Eosinophils Absolute: 0.2 10*3/uL (ref 0.0–0.5)
Eosinophils Relative: 3 %
HCT: 30.5 % — ABNORMAL LOW (ref 39.0–52.0)
Hemoglobin: 9.4 g/dL — ABNORMAL LOW (ref 13.0–17.0)
Immature Granulocytes: 1 %
Lymphocytes Relative: 23 %
Lymphs Abs: 1.9 10*3/uL (ref 0.7–4.0)
MCH: 26.1 pg (ref 26.0–34.0)
MCHC: 30.8 g/dL (ref 30.0–36.0)
MCV: 84.7 fL (ref 80.0–100.0)
Monocytes Absolute: 1 10*3/uL (ref 0.1–1.0)
Monocytes Relative: 12 %
Neutro Abs: 5.1 10*3/uL (ref 1.7–7.7)
Neutrophils Relative %: 61 %
Platelets: 149 10*3/uL — ABNORMAL LOW (ref 150–400)
RBC: 3.6 MIL/uL — ABNORMAL LOW (ref 4.22–5.81)
RDW: 19.7 % — ABNORMAL HIGH (ref 11.5–15.5)
WBC: 8.3 10*3/uL (ref 4.0–10.5)
nRBC: 0 % (ref 0.0–0.2)

## 2023-02-11 LAB — BASIC METABOLIC PANEL
Anion gap: 10 (ref 5–15)
BUN: 6 mg/dL (ref 6–20)
CO2: 19 mmol/L — ABNORMAL LOW (ref 22–32)
Calcium: 8.2 mg/dL — ABNORMAL LOW (ref 8.9–10.3)
Chloride: 107 mmol/L (ref 98–111)
Creatinine, Ser: 0.94 mg/dL (ref 0.61–1.24)
GFR, Estimated: >60 mL/min (ref >60)
Glucose, Bld: 136 mg/dL — ABNORMAL HIGH (ref 70–99)
Potassium: 4.5 mmol/L (ref 3.5–5.1)
Sodium: 136 mmol/L (ref 135–145)

## 2023-02-11 MED ORDER — PANTOPRAZOLE SODIUM 40 MG PO TBEC: 40.0000 mg | DELAYED_RELEASE_TABLET | Freq: Two times a day (BID) | ORAL | 0 refills | Status: DC

## 2023-02-11 MED ORDER — SIMETHICONE 40 MG/0.6ML PO LIQD: 40.0000 mg | Freq: Three times a day (TID) | ORAL | 0 refills | Status: DC

## 2023-02-11 MED ORDER — LIDOCAINE VISCOUS HCL 2 % MT SOLN: 15.0000 mL | Freq: Four times a day (QID) | OROMUCOSAL | 0 refills | Status: AC | PRN

## 2023-02-11 MED ORDER — LOPERAMIDE HCL 2 MG PO CAPS: 2.0000 mg | ORAL_CAPSULE | Freq: Four times a day (QID) | ORAL | 0 refills | Status: AC | PRN

## 2023-02-11 MED ORDER — LIDOCAINE VISCOUS HCL 2 % MT SOLN: 15.0000 mL | Freq: Two times a day (BID) | OROMUCOSAL | 3 refills | Status: DC | PRN

## 2023-02-11 MED ORDER — OXYCODONE HCL 5 MG PO TABS: 5.0000 mg | ORAL_TABLET | Freq: Four times a day (QID) | ORAL | 0 refills | Status: DC | PRN

## 2023-02-11 MED ORDER — SODIUM BICARBONATE 650 MG PO TABS: 1300.0000 mg | ORAL_TABLET | Freq: Two times a day (BID) | ORAL | 0 refills | Status: AC

## 2023-02-11 MED ORDER — CALAMINE EX LOTN: 1.0000 | TOPICAL_LOTION | CUTANEOUS | 0 refills | Status: AC | PRN

## 2023-02-11 MED ORDER — OXYCODONE HCL 5 MG PO TABS: 5.0000 mg | ORAL_TABLET | Freq: Four times a day (QID) | ORAL | 0 refills | Status: AC | PRN

## 2023-02-11 NOTE — Progress Notes (Signed)
Patient is being discharged home, self care. PIV removed. AVS discussed, pt verbalized understanding. Patient escorted off the unit via wheelchair with his belongings, in NAD.

## 2023-02-11 NOTE — Discharge Summary (Addendum)
Physician Discharge Summary  [name] NWG:[id number] DOB: [birth year] DOA: 02/07/2023  PCP: Pcp, No  Admit date: 02/07/2023 Discharge date: 02/11/2023  Admitted From: Home Discharge disposition: Home  Recommendations at discharge:  Stop alcohol Stop drug use Follow-up with pain management as an outpatient Continue Protonix, viscous lidocaine  Brief narrative: [name] is a 52 y.o. male with PMH significant for chronic alcoholism, chronic pancreatitis with pancreatic pseudocyst, polysubstance abuse (tobacco, marijuana, cocaine), HTN, paroxysmal A-fib not on anticoagulation, WPW syndrome s/p ablation, GERD, bipolar disorder, anxiety/depression, h/o suicidal ideation. Recently hospitalized 9/17 to 9/19 for intractable nausea, vomiting diarrhea suspected to be due to marijuana use. 10/1, he presented to the ED with complaint of abdominal pain and also with nausea, vomiting and diarrhea.  Patient continues to drink and drank about half a pill of vodka and more than a dozen 12 ounce beers in a span of 48 hours prior to presentation.  Also did cocaine in the last few days  In the ED, initial heart rate as low as 47, blood pressure 150s, breathing on room air Labs with WC of 12.9, hemoglobin 10.7, serum bicarb low at 13, anion gap elevated BUN/creatinine 9/2.09, magnesium low at 1.4, lipase level not elevated. Patient was given IV fluid, IV morphine, IV Zofran Kept in observation to [facility]  Subjective: Patient was seen and examined this morning.  Sitting up in bed.  Not in distress.  Continue to have dull aching abdominal pain but better than at presentation.  Able to tolerate soft diet.  Hospital course: Intractable abdominal pain Chronic pancreatitis and pancreatic pseudocyst Presented with intractable abdominal pain, nausea, vomiting, diarrhea after heavy drinking for 2 days. Lipase was normal  MRCP showed moderate-sized pseudocyst in the mid body of the pancreas  with upstream ductal ectasia and cystic change consistent with sequelae of prior pancreatitis.  No evidence of acute pancreatitis.  Normal biliary tree.  No enhancing liver lesion. Patient was started on clear liquid diet and gradually advanced. Adequately hydrated. For acute pain, he needed IV and oral pain meds.  Pain improving.  Discharged on short course of oral pain meds. Recommended to follow-up with pain management as an outpatient. Continue Creon.  Add Imodium as needed. Follows up with [facility] as an outpatient Recent Labs  Lab 02/07/23 2343  LIPASE 31    Chronic alcohol use High risk of alcohol withdrawal Patient continued to drink alcohol despite chronic pancreatitis. Counseled to quit. No withdrawal symptoms in the hospital.  Leukocytosis Likely stress reaction.  No evidence of infection.  WBC count normal this morning Recent Labs  Lab 02/07/23 2343 02/08/23 1604 02/09/23 0452 02/10/23 0424 02/11/23 0804  WBC 12.9* 8.6 7.9 6.5 8.3   Acute kidney injury Acute anion gap metabolic acidosis Creatinine normal at baseline.  Present with creatinine elevated to 2.09 likely prerenal due to GI loss.  Serum bicarb was low and anion gap was wide.  It could be due to excess alcohol and lactic acid and blood.  However neither blood alcohol ever not lactic acid level were checked on presentation. With IV hydration, creatinine and anion gap have normalized.  Serum bicarbonate level improved with sodium bicarb supplement.  I will sodium bicarb supplement for 1 week. Recent Labs    01/10/23 1343 01/11/23 0912 01/16/23 0415 01/24/23 2341 01/25/23 2231 01/26/23 0313 02/05/23 2229 02/07/23 2343 02/09/23 0452 02/10/23 0424 02/11/23 0804  BUN <5* <5* 12 14  --  5* 11 9 9  5* 6  CREATININE  0.67 0.75 1.08 0.92 0.63 0.72 1.32* 2.09* 1.17 0.83 0.94  CO2 19* 20* 21* 16*  --  18* 16* 13* 14* 15* 19*   Hyperkalemia Hypomagnesemia Potassium level was elevated at 5.8 yesterday.   Unclear etiology.  Improved by self today.. Recent Labs  Lab 02/05/23 2229 02/07/23 2343 02/09/23 0452 02/10/23 0424 02/11/23 0804  K 4.4 3.9 4.7 5.8* 4.5  MG  --  1.4*  --   --   --   PHOS  --  5.5*  --   --   --    Paroxysmal atrial fibrillation H/o WPW syndrome s/p ablation Patient appears to be in sinus rhythm at this time Continue metoprolol as before  Hypertension Blood pressure improved after IV fluid was stopped.  Continue metoprolol as before.    GERD H/o PUD Continue Protonix and Carafate  Polysubstance abuse (tobacco, cocaine, marijuana, alcohol) Counseled to quit   Mobility: Encourage ambulation  Goals of care   Code Status: Full Code   Wounds:  -    Discharge Exam:   Vitals:   02/10/23 1538 02/10/23 1959 02/11/23 0431 02/11/23 0719  BP: (!) 147/89 (!) 136/92 (!) 142/93 139/89  Pulse: 72 75 81 70  Resp: 16 20 (!) 21 16  Temp: 98.1 F (36.7 C) 98.2 F (36.8 C) 98.6 F (37 C) 98.2 F (36.8 C)  TempSrc: Oral Oral Oral Oral  SpO2: 100% 100% 100% 100%  Weight:      Height:        Body mass index is 18.4 kg/m.  General exam: Middle-aged [name] Skin: No rashes, lesions or ulcers. HEENT: Atraumatic, normocephalic, no obvious bleeding Lungs: Clear to auscultation bilaterally CVS: Regular rate and rhythm, no murmur GI/Abd soft, mild epigastric tenderness present, bowel sound present CNS: Alert, awake, oriented x 3 Psychiatry: Anxious.  Not in alcohol withdrawal at this time Extremities: No pedal edema, no calf tenderness  Follow ups:    Follow-up Information     Nooruddin, Jason Fila, MD Follow up.   Specialty: Internal Medicine Why: March 06, 2023 at 2:15 pm Contact information: [address] [place] [address] [phone]                 Discharge Instructions:   Discharge Instructions     Call MD for:  difficulty breathing, headache or visual disturbances   Complete by: As directed    Call MD for:   extreme fatigue   Complete by: As directed    Call MD for:  hives   Complete by: As directed    Call MD for:  persistant dizziness or light-headedness   Complete by: As directed    Call MD for:  persistant nausea and vomiting   Complete by: As directed    Call MD for:  severe uncontrolled pain   Complete by: As directed    Call MD for:  temperature >100.4   Complete by: As directed    Diet general   Complete by: As directed    Discharge instructions   Complete by: As directed    Recommendations at discharge:   Stop alcohol  Stop drug use  Follow-up with pain management as an outpatient  Continue Protonix, viscous lidocaine  General discharge instructions: Follow with Primary MD Pcp, No in 7 days  Please request your PCP  to go over your hospital tests, procedures, radiology results at the follow up. Please get your medicines reviewed and adjusted.  Your PCP may decide to repeat  certain labs or tests as needed. Do not drive, operate heavy machinery, perform activities at heights, swimming or participation in water activities or provide baby sitting services if your were admitted for syncope or siezures until you have seen by Primary MD or a Neurologist and advised to do so again. [place] Controlled Substance Reporting System database was reviewed. Do not drive, operate heavy machinery, perform activities at heights, swim, participate in water activities or provide baby-sitting services while on medications for pain, sleep and mood until your outpatient physician has reevaluated you and advised to do so again.  You are strongly recommended to comply with the dose, frequency and duration of prescribed medications. Activity: As tolerated with Full fall precautions use walker/cane & assistance as needed Avoid using any recreational substances like cigarette, tobacco, alcohol, or non-prescribed drug. If you experience worsening of your admission symptoms, develop shortness of  breath, life threatening emergency, suicidal or homicidal thoughts you must seek medical attention immediately by calling 911 or calling your MD immediately  if symptoms less severe. You must read complete instructions/literature along with all the possible adverse reactions/side effects for all the medicines you take and that have been prescribed to you. Take any new medicine only after you have completely understood and accepted all the possible adverse reactions/side effects.  Wear Seat belts while driving. You were cared for by a hospitalist during your hospital stay. If you have any questions about your discharge medications or the care you received while you were in the hospital after you are discharged, you can call the unit and ask to speak with the hospitalist or the covering physician. Once you are discharged, your primary care physician will handle any further medical issues. Please note that NO REFILLS for any discharge medications will be authorized once you are discharged, as it is imperative that you return to your primary care physician (or establish a relationship with a primary care physician if you do not have one).   Increase activity slowly   Complete by: As directed        Discharge Medications:   Allergies as of 02/11/2023       Reactions   Robaxin [methocarbamol] Other (See Comments)   jumpy limbs   Aspirin Other (See Comments)   Unknown reaction   Shellfish-derived Products Nausea And Vomiting, Rash   Trazodone And Nefazodone Other (See Comments)   Muscle spasms   Adhesive [tape] Itching   Latex Itching   Toradol [ketorolac Tromethamine] Other (See Comments)   Has ulcers; cannot have this   Contrast Media [iodinated Contrast Media] Hives   Reglan [metoclopramide] Other (See Comments)   Muscle spasms   Salmon [fish Oil] Nausea And Vomiting, Rash        Medication List     STOP taking these medications    lidocaine 5 % ointment Commonly known as:  XYLOCAINE   pregabalin 100 MG capsule Commonly known as: Lyrica       TAKE these medications    calamine lotion Apply 1 Application topically as needed for up to 7 days for itching.   famotidine 20 MG tablet Commonly known as: PEPCID Take 20 mg by mouth 2 (two) times daily.   fluticasone 50 MCG/ACT nasal spray Commonly known as: FLONASE Place 2 sprays into both nostrils 2 (two) times daily as needed for allergies.   folic acid 1 MG tablet Commonly known as: FOLVITE Take 1 tablet (1 mg total) by mouth daily.   lidocaine 2 % solution  Commonly known as: XYLOCAINE Use as directed 15 mLs in the mouth or throat every 6 (six) hours as needed for up to 7 days for mouth pain. What changed: when to take this   lipase/protease/amylase 29562 UNITS Cpep capsule Commonly known as: Creon Take 2 capsules (72,000 Units total) by mouth 3 (three) times daily with meals. May also take 1 capsule (36,000 Units total) as needed (with snacks).   loperamide 2 MG capsule Commonly known as: IMODIUM Take 1 capsule (2 mg total) by mouth every 6 (six) hours as needed for up to 5 days for diarrhea or loose stools.   metoprolol 200 MG 24 hr tablet Commonly known as: TOPROL-XL Take 200 mg by mouth daily.   nitroGLYCERIN 0.4 MG SL tablet Commonly known as: NITROSTAT Place 0.4 mg under the tongue every 5 (five) minutes as needed for chest pain.   ondansetron 4 MG disintegrating tablet Commonly known as: ZOFRAN-ODT Take 1 tablet (4 mg total) by mouth 2 (two) times daily as needed for nausea or vomiting. What changed: when to take this   oxyCODONE 5 MG immediate release tablet Commonly known as: Oxy IR/ROXICODONE Take 1 tablet (5 mg total) by mouth every 6 (six) hours as needed for up to 5 days for severe pain. What changed: when to take this   pantoprazole 40 MG tablet Commonly known as: Protonix Take 1 tablet (40 mg total) by mouth 2 (two) times daily before a meal. What changed: when to take  this   Simethicone 40 MG/0.6ML Liqd Take 0.6 mLs (40 mg total) by mouth in the morning, at noon, and at bedtime for 7 days.   sodium bicarbonate 650 MG tablet Take 2 tablets (1,300 mg total) by mouth 2 (two) times daily for 7 days.   sucralfate 1 GM/10ML suspension Commonly known as: Carafate Take 10 mLs (1 g total) by mouth See admin instructions. Take 4 times with meals and at bedtime. What changed: additional instructions         The results of significant diagnostics from this hospitalization (including imaging, microbiology, ancillary and laboratory) are listed below for reference.    Procedures and Diagnostic Studies:   MR ABDOMEN MRCP W WO CONTAST  Result Date: 02/09/2023 CLINICAL DATA:  Persistent pancreatitis. EXAM: MRI ABDOMEN WITHOUT AND WITH CONTRAST (INCLUDING MRCP) TECHNIQUE: Multiplanar multisequence MR imaging of the abdomen was performed both before and after the administration of intravenous contrast. Heavily T2-weighted images of the biliary and pancreatic ducts were obtained, and three-dimensional MRCP images were rendered by post processing. CONTRAST:  7.47mL GADAVIST GADOBUTROL 1 MMOL/ML IV SOLN COMPARISON:  CT 01/25/2023 FINDINGS: Lower chest:  Lung bases are clear. Hepatobiliary: No intrahepatic biliary duct dilatation. No early enhancing hepatic lesion. Gallbladder normal. The common bile duct common hepatic duct normal caliber. No choledocholithiasis. Pancreas: Pancreatic duct is normal caliber through the head and midbody. There is a large cystic collection in the mid body of the pancreas towards the tail measuring 3.6 cm by 3.1 cm (image 17/series 3). This cystic structure is uniformly high signal intensity on T2 weighted imaging and no post-contrast enhancement. There is ductal ectasia and cystic change in the tail the pancreas (image 15/3. The large cyst in the mid pancreas does have inherent intermediate signal intensity on T1 weighted imaging demonstrate  consistent with proteinaceous material within cyst. There is no post-contrast enhancement (series 101). Cyst size not changed from comparison CT. No suspicious lesions in the pancreas. There are no fluid collections outside of the  pancreas. No fluid in the peritoneal retroperitoneal space. [name] Spleen: Normal spleen. Adrenals/urinary tract: Adrenal glands and kidneys are normal. Stomach/Bowel: Stomach and limited of the small bowel is unremarkable Vascular/Lymphatic: Abdominal aortic normal caliber. No retroperitoneal periportal lymphadenopathy. Musculoskeletal: No aggressive osseous lesion IMPRESSION: 1. 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 No evidence acute pancreatitis. 2. Normal biliary tree. 3. No enhancing lesion within liver. Electronically Signed   By: Genevive Bi M.D.   On: 02/09/2023 09:09   MR 3D Recon At Scanner  Result Date: 02/09/2023 CLINICAL DATA:  Persistent pancreatitis. EXAM: MRI ABDOMEN WITHOUT AND WITH CONTRAST (INCLUDING MRCP) TECHNIQUE: Multiplanar multisequence MR imaging of the abdomen was performed both before and after the administration of intravenous contrast. Heavily T2-weighted images of the biliary and pancreatic ducts were obtained, and three-dimensional MRCP images were rendered by post processing. CONTRAST:  7.46mL GADAVIST GADOBUTROL 1 MMOL/ML IV SOLN COMPARISON:  CT 01/25/2023 FINDINGS: Lower chest:  Lung bases are clear. Hepatobiliary: No intrahepatic biliary duct dilatation. No early enhancing hepatic lesion. Gallbladder normal. The common bile duct common hepatic duct normal caliber. No choledocholithiasis. Pancreas: Pancreatic duct is normal caliber through the head and midbody. There is a large cystic collection in the mid body of the pancreas towards the tail measuring 3.6 cm by 3.1 cm (image 17/series 3). This cystic structure is uniformly high signal  intensity on T2 weighted imaging and no post-contrast enhancement. There is ductal ectasia and cystic change in the tail the pancreas (image 15/3. The large cyst in the mid pancreas does have inherent intermediate signal intensity on T1 weighted imaging demonstrate consistent with proteinaceous material within cyst. There is no post-contrast enhancement (series 101). Cyst size not changed from comparison CT. No suspicious lesions in the pancreas. There are no fluid collections outside of the pancreas. No fluid in the peritoneal retroperitoneal space. [name] Spleen: Normal spleen. Adrenals/urinary tract: Adrenal glands and kidneys are normal. Stomach/Bowel: Stomach and limited of the small bowel is unremarkable Vascular/Lymphatic: Abdominal aortic normal caliber. No retroperitoneal periportal lymphadenopathy. Musculoskeletal: No aggressive osseous lesion IMPRESSION: 1. 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 No evidence acute pancreatitis. 2. Normal biliary tree. 3. No enhancing lesion within liver. Electronically Signed   By: Genevive Bi M.D.   On: 02/09/2023 09:09     Labs:   Basic Metabolic Panel: Recent Labs  Lab 02/05/23 2229 02/07/23 2343 02/09/23 0452 02/10/23 0424 02/11/23 0804  NA 139 138 136 138 136  K 4.4 3.9 4.7 5.8* 4.5  CL 107 106 111 114* 107  CO2 16* 13* 14* 15* 19*  GLUCOSE 96 87 107* 67* 136*  BUN 11 9 9  5* 6  CREATININE 1.32* 2.09* 1.17 0.83 0.94  CALCIUM 8.8* 8.8* 8.2* 7.8* 8.2*  MG  --  1.4*  --   --   --   PHOS  --  5.5*  --   --   --    GFR Estimated Creatinine Clearance: 70.6 mL/min (by C-G formula based on SCr of 0.94 mg/dL). Liver Function Tests: Recent Labs  Lab 02/05/23 2229 02/07/23 2343 02/09/23 0452  AST 46* 46* 38  ALT 35 36 28  ALKPHOS 97 113 93  BILITOT 0.8 0.8 1.0  PROT 7.2 8.5* 7.0  ALBUMIN 4.0 4.5 3.7   Recent Labs  Lab  02/07/23 2343  LIPASE 31   No results for input(s): "AMMONIA" in the last 168 hours. Coagulation profile Recent Labs  Lab 02/08/23 0944  INR 1.1    CBC: Recent Labs  Lab 02/05/23 2229 02/07/23 2343 02/08/23 1604 02/09/23 0452 02/10/23 0424 02/11/23 0804  WBC 6.7 12.9* 8.6 7.9 6.5 8.3  NEUTROABS 2.9  --  4.4  --  3.2 5.1  HGB 10.0* 10.7* 9.7* 9.1* 8.1* 9.4*  HCT 31.6* 35.9* 31.0* 30.1* 26.9* 30.5*  MCV 84.5 86.3 84.9 85.5 89.7 84.7  PLT 192 202 173 164 145* 149*   Cardiac Enzymes: Recent Labs  Lab 02/08/23 0743  CKTOTAL 385   BNP: Invalid input(s): "POCBNP" CBG: Recent Labs  Lab 02/05/23 2254 02/05/23 2348  GLUCAP 48* 185*   D-Dimer No results for input(s): "DDIMER" in the last 72 hours. Hgb A1c No results for input(s): "HGBA1C" in the last 72 hours. Lipid Profile No results for input(s): "CHOL", "HDL", "LDLCALC", "TRIG", "CHOLHDL", "LDLDIRECT" in the last 72 hours. Thyroid function studies No results for input(s): "TSH", "T4TOTAL", "T3FREE", "THYROIDAB" in the last 72 hours.  Invalid input(s): "FREET3" Anemia work up No results for input(s): "VITAMINB12", "FOLATE", "FERRITIN", "TIBC", "IRON", "RETICCTPCT" in the last 72 hours. Microbiology No results found for this or any previous visit (from the past 240 hour(s)).  Time coordinating discharge: 45 minutes  Signed: Dennis Killilea  [facility] 02/11/2023, 11:24 AM

## 2023-02-11 NOTE — Plan of Care (Signed)

## 2023-02-19 ENCOUNTER — Emergency Department (HOSPITAL_COMMUNITY)
Admission: EM | Admit: 2023-02-19 | Discharge: 2023-02-19 | Disposition: A | Discharge status: Home / Self Care | Payer: MEDICAID | Attending: Emergency Medicine | Admitting: Emergency Medicine

## 2023-02-19 ENCOUNTER — Encounter (HOSPITAL_COMMUNITY): Payer: Self-pay

## 2023-02-19 ENCOUNTER — Emergency Department (HOSPITAL_COMMUNITY): Payer: MEDICAID

## 2023-02-19 ENCOUNTER — Other Ambulatory Visit: Payer: Self-pay

## 2023-02-19 DIAGNOSIS — Z91040 Latex allergy status: Secondary | ICD-10-CM | POA: Diagnosis not present

## 2023-02-19 DIAGNOSIS — R0789 Other chest pain: Secondary | ICD-10-CM | POA: Diagnosis present

## 2023-02-19 LAB — COMPREHENSIVE METABOLIC PANEL
ALT: 23 U/L (ref 0–44)
AST: 46 U/L — ABNORMAL HIGH (ref 15–41)
Albumin: 3.5 g/dL (ref 3.5–5.0)
Alkaline Phosphatase: 82 U/L (ref 38–126)
Anion gap: 14 (ref 5–15)
BUN: 7 mg/dL (ref 6–20)
CO2: 21 mmol/L — ABNORMAL LOW (ref 22–32)
Calcium: 8.3 mg/dL — ABNORMAL LOW (ref 8.9–10.3)
Chloride: 105 mmol/L (ref 98–111)
Creatinine, Ser: 1.06 mg/dL (ref 0.61–1.24)
GFR, Estimated: >60 mL/min (ref >60)
Glucose, Bld: 94 mg/dL (ref 70–99)
Potassium: 4.1 mmol/L (ref 3.5–5.1)
Sodium: 140 mmol/L (ref 135–145)
Total Bilirubin: 0.7 mg/dL (ref 0.3–1.2)
Total Protein: 6.6 g/dL (ref 6.5–8.1)

## 2023-02-19 LAB — CBC WITH DIFFERENTIAL/PLATELET
Abs Immature Granulocytes: 0.02 10*3/uL (ref 0.00–0.07)
Basophils Absolute: 0 10*3/uL (ref 0.0–0.1)
Basophils Relative: 0 %
Eosinophils Absolute: 0.1 10*3/uL (ref 0.0–0.5)
Eosinophils Relative: 2 %
HCT: 28.4 % — ABNORMAL LOW (ref 39.0–52.0)
Hemoglobin: 9 g/dL — ABNORMAL LOW (ref 13.0–17.0)
Immature Granulocytes: 0 %
Lymphocytes Relative: 43 %
Lymphs Abs: 3.3 10*3/uL (ref 0.7–4.0)
MCH: 25.9 pg — ABNORMAL LOW (ref 26.0–34.0)
MCHC: 31.7 g/dL (ref 30.0–36.0)
MCV: 81.8 fL (ref 80.0–100.0)
Monocytes Absolute: 0.8 10*3/uL (ref 0.1–1.0)
Monocytes Relative: 11 %
Neutro Abs: 3.4 10*3/uL (ref 1.7–7.7)
Neutrophils Relative %: 44 %
Platelets: 184 10*3/uL (ref 150–400)
RBC: 3.47 MIL/uL — ABNORMAL LOW (ref 4.22–5.81)
RDW: 19.4 % — ABNORMAL HIGH (ref 11.5–15.5)
WBC: 7.7 10*3/uL (ref 4.0–10.5)
nRBC: 0 % (ref 0.0–0.2)

## 2023-02-19 LAB — TROPONIN I (HIGH SENSITIVITY)
Troponin I (High Sensitivity): 5 ng/L (ref <18)
Troponin I (High Sensitivity): 4 ng/L (ref <18)

## 2023-02-19 LAB — RAPID URINE DRUG SCREEN, HOSP PERFORMED
Amphetamines: NOT DETECTED
Barbiturates: NOT DETECTED
Benzodiazepines: NOT DETECTED
Cocaine: NOT DETECTED
Opiates: NOT DETECTED
Tetrahydrocannabinol: NOT DETECTED

## 2023-02-19 LAB — URINALYSIS, ROUTINE W REFLEX MICROSCOPIC
Bilirubin Urine: NEGATIVE
Glucose, UA: NEGATIVE mg/dL
Hgb urine dipstick: NEGATIVE
Ketones, ur: NEGATIVE mg/dL
Leukocytes,Ua: NEGATIVE
Nitrite: NEGATIVE
Protein, ur: NEGATIVE mg/dL
Specific Gravity, Urine: 1.009 (ref 1.005–1.030)
pH: 6 (ref 5.0–8.0)

## 2023-02-19 MED ORDER — ONDANSETRON HCL 4 MG/2ML IJ SOLN
4.0000 mg | Freq: Once | INTRAMUSCULAR | Status: AC
  Administered 2023-02-19: 4 mg via INTRAVENOUS
  Filled 2023-02-19: qty 2

## 2023-02-19 MED ORDER — FENTANYL CITRATE PF 50 MCG/ML IJ SOSY
25.0000 ug | PREFILLED_SYRINGE | Freq: Once | INTRAMUSCULAR | Status: AC
  Administered 2023-02-19: 25 ug via INTRAVENOUS
  Filled 2023-02-19: qty 1

## 2023-02-19 NOTE — ED Provider Notes (Signed)
[place] EMERGENCY DEPARTMENT AT [facility] Provider Note   CSN: [id number] Arrival date & time: 02/19/23  1825     History Chief Complaint  Patient presents with   Chest Pain    [name] is a 52 y.o. male.  Patient past history significant for cocaine use disorder, chronic pancreatitis, pancreatic pseudocyst, tachycardia, and chest pain presents emergency department concerns of chest pain.  States that pain presented about an hour prior to arriving and was brought in by EMS.  Endorses the pain is a sharp stabbing feeling with radiation from the chest towards the neck but also the stomach.  Denies any diaphoresis, nausea, vomiting or shortness of breath.  Patient also currently denies any cocaine use but does have prior history of cocaine use.  No recent alcohol use either.  Denies any specific abdominal pain such as the epigastrium.   Chest Pain      Home Medications Prior to Admission medications   Medication Sig Start Date End Date Taking? Authorizing Provider  famotidine (PEPCID) 20 MG tablet Take 20 mg by mouth 2 (two) times daily.    [provider]  fluticasone (FLONASE) 50 MCG/ACT nasal spray Place 2 sprays into both nostrils 2 (two) times daily as needed for allergies.    [provider]  folic acid (FOLVITE) 1 MG tablet Take 1 tablet (1 mg total) by mouth daily. 09/21/22   Pokhrel, Rebekah Chesterfield, MD  lipase/protease/amylase (CREON) 36000 UNITS CPEP capsule Take 2 capsules (72,000 Units total) by mouth 3 (three) times daily with meals. May also take 1 capsule (36,000 Units total) as needed (with snacks). 01/26/23   Jerald Kief, MD  metoprolol (TOPROL-XL) 200 MG 24 hr tablet Take 200 mg by mouth daily. 11/30/22   [provider]  nitroGLYCERIN (NITROSTAT) 0.4 MG SL tablet Place 0.4 mg under the tongue every 5 (five) minutes as needed for chest pain.    [provider]  ondansetron (ZOFRAN-ODT) 4 MG disintegrating tablet  Take 1 tablet (4 mg total) by mouth 2 (two) times daily as needed for nausea or vomiting. Patient taking differently: Take 4 mg by mouth as needed for nausea or vomiting. 01/26/23   Jerald Kief, MD  pantoprazole (PROTONIX) 40 MG tablet Take 1 tablet (40 mg total) by mouth 2 (two) times daily before a meal. 02/11/23 03/13/23  Dahal, Melina Schools, MD  Simethicone 40 MG/0.6ML LIQD Take 0.6 mLs (40 mg total) by mouth in the morning, at noon, and at bedtime for 7 days. 02/11/23 02/18/23  Dahal, Melina Schools, MD  sucralfate (CARAFATE) 1 GM/10ML suspension Take 10 mLs (1 g total) by mouth See admin instructions. Take 4 times with meals and at bedtime. Patient taking differently: Take 1 g by mouth See admin instructions. Take 1g by mouth three times a day with meals.  May take an additional 1g with a snack. 01/11/23   Jerald Kief, MD  amitriptyline (ELAVIL) 25 MG tablet Take 1 tablet (25 mg total) by mouth at bedtime. Patient not taking: Reported on 08/08/2019 10/15/18 08/08/19  Rolly Salter, MD      Allergies    Robaxin [methocarbamol], Aspirin, Shellfish-derived products, Trazodone and nefazodone, Adhesive [tape], Latex, Toradol [ketorolac tromethamine], Contrast media [iodinated contrast media], Reglan [metoclopramide], and Salmon [fish oil]    Review of Systems   Review of Systems  Cardiovascular:  Positive for chest pain.  All other systems reviewed and are negative.   Physical Exam Updated Vital Signs BP (!) 162/100 (  BP Location: Right Arm)   Pulse 80   Temp 98.3 F (36.8 C) (Oral)   Resp 16   SpO2 100%  Physical Exam Vitals and nursing note reviewed.  Constitutional:      General: He is not in acute distress.    Appearance: He is well-developed.  HENT:     Head: Normocephalic and atraumatic.  Eyes:     Conjunctiva/sclera: Conjunctivae normal.  Cardiovascular:     Rate and Rhythm: Normal rate and regular rhythm.     Heart sounds: No murmur heard. Pulmonary:     Effort: Pulmonary effort is  normal. No respiratory distress.     Breath sounds: Normal breath sounds.  Abdominal:     General: Bowel sounds are normal.     Palpations: Abdomen is soft. There is no mass.     Tenderness: There is no abdominal tenderness. There is no guarding or rebound.  Musculoskeletal:        General: No swelling.     Cervical back: Neck supple.  Skin:    General: Skin is warm and dry.     Capillary Refill: Capillary refill takes less than 2 seconds.  Neurological:     Mental Status: He is alert.  Psychiatric:        Mood and Affect: Mood normal.     ED Results / Procedures / Treatments   Labs (all labs ordered are listed, but only abnormal results are displayed) Labs Reviewed  CBC WITH DIFFERENTIAL/PLATELET - Abnormal; Notable for the following components:      Result Value   RBC 3.47 (*)    Hemoglobin 9.0 (*)    HCT 28.4 (*)    MCH 25.9 (*)    RDW 19.4 (*)    All other components within normal limits  COMPREHENSIVE METABOLIC PANEL - Abnormal; Notable for the following components:   CO2 21 (*)    Calcium 8.3 (*)    AST 46 (*)    All other components within normal limits  RAPID URINE DRUG SCREEN, HOSP PERFORMED  URINALYSIS, ROUTINE W REFLEX MICROSCOPIC  TROPONIN I (HIGH SENSITIVITY)  TROPONIN I (HIGH SENSITIVITY)    EKG EKG Interpretation Date/Time:  Sunday February 19 2023 18:50:10 EDT Ventricular Rate:  75 PR Interval:  110 QRS Duration:  80 QT Interval:  386 QTC Calculation: 431 R Axis:   75  Text Interpretation: Sinus rhythm with short PR with Premature atrial complexes with Abberant conduction Right atrial enlargement T wave abnormality, consider anterior ischemia Abnormal ECG When compared with ECG of 07-Feb-2023 23:18, Premature atrial complexes are now present Confirmed by Dione Booze ([id number]) on 02/19/2023 10:51:00 PM  Radiology DG Chest 2 View  Result Date: 02/19/2023 CLINICAL DATA:  Chest pain. EXAM: CHEST - 2 VIEW COMPARISON:  Chest radiograph dated  02/05/2023. FINDINGS: No focal consolidation, pleural effusion, or pneumothorax. The cardiac silhouette is within normal limits. No acute osseous pathology. IMPRESSION: No active cardiopulmonary disease. Electronically Signed   By: Elgie Collard M.D.   On: 02/19/2023 20:01    Procedures Procedures   Medications Ordered in ED Medications  ondansetron [facility]) injection 4 mg (4 mg Intravenous Given 02/19/23 2040)  fentaNYL (SUBLIMAZE) injection 25 mcg (25 mcg Intravenous Given 02/19/23 2040)  fentaNYL (SUBLIMAZE) injection 25 mcg (25 mcg Intravenous Given 02/19/23 2246)    ED Course/ Medical Decision Making/ A&P  Medical Decision Making Amount and/or Complexity of Data Reviewed Labs: ordered. Radiology: ordered.  Risk Prescription drug management.   This patient presents to the ED for concern of chest pain.  Differential diagnosis includes ACS, esophageal spasm, GERD, PE, pneumonia   Lab Tests:  I Ordered, and personally interpreted labs.  The pertinent results include: CBC unremarkable and at baseline, CMP without any acute findings suggesting AKI or other electrolyte abnormality, troponin flat at 5 and then 4, UDS negative, urinalysis without evidence of infection   Imaging Studies ordered:  I ordered imaging studies including chest x-ray I independently visualized and interpreted imaging which showed no acute cardiopulmonary process I agree with the radiologist interpretation   Medicines ordered and prescription drug management:  I ordered medication including fentanyl, Zofran for pain and nausea Reevaluation of the patient after these medicines showed that the patient improved I have reviewed the patients home medicines and have made adjustments as needed   Problem List / ED Course:  Patient presented to the emergency department with concerns of chest pain.  Significant past medical history concerning for cocaine use and other  polysubstance use disorders.  Previously has had multiple presentations to the emergency department concerns of chest pain without any acute etiology noted.  Patient is followed closely by cardiology.  States that the pain began earlier this evening about an hour prior to arriving when he reports he noted centralized chest pain with some radiation towards the neck and the stomach.  Denies any nausea, vomiting, or diaphoresis as his chest pain began.  Currently Dors is that pain is minimal in nature but still feels some discomfort.  Given history and current presentation of chest pain, will order evaluation with CBC, CMP, troponin, UA and UDS.  Low concern at this time for ACS as patient's EKG is unremarkable.  Chest x-ray also ordered for evaluation. Basic labs are thankfully reassuring without any acute abnormalities noted.  No evidence of AKI.  No signs of urinary infection or any substance use based on UDS screening.  Troponin is also negative and flat on delta troponin.  Chest x-ray with no acute findings. On reassessment of patient, he reports improvement after administration of fentanyl and Zofran.  Patient feels comfortable discharging home at this time and verbalized understanding strict return precautions.  Advised patient to follow-up with primary care provider and cardiology.  All questions answered prior to patient discharge.  Patient discharged home in stable condition.   Final Clinical Impression(s) / ED Diagnoses Final diagnoses:  Other chest pain    Rx / DC Orders ED Discharge Orders     None         Smitty Knudsen, PA-C 02/19/23 2301    Glyn Ade, MD 02/19/23 2321

## 2023-02-19 NOTE — Discharge Instructions (Signed)
You are seen in the emergency department for chest pain.  Thankfully your labs and imaging are reassuring at this time no signs of cardiac abnormalities are ongoing.  I would recommend following up with your primary care provider and cardiology team.  If symptoms worsen, return the emergency department.

## 2023-02-19 NOTE — ED Triage Notes (Signed)
Pt BIB [facility] from home d/t CP that started an hour ago. Sharp and stabbing pain radiate from neck to chest to stomach. 4mg  zofran given by EMS. No n/v. No SOB.   BP190/108 82 HR  100% RA

## 2023-02-21 ENCOUNTER — Other Ambulatory Visit: Payer: Self-pay

## 2023-02-21 ENCOUNTER — Emergency Department (HOSPITAL_COMMUNITY): Payer: MEDICAID

## 2023-02-21 ENCOUNTER — Observation Stay (HOSPITAL_COMMUNITY)
Admission: EM | Admit: 2023-02-21 | Discharge: 2023-02-22 | Disposition: A | Discharge status: Home / Self Care | Payer: MEDICAID | Attending: Obstetrics and Gynecology | Admitting: Obstetrics and Gynecology

## 2023-02-21 ENCOUNTER — Encounter (HOSPITAL_COMMUNITY): Payer: Self-pay

## 2023-02-21 DIAGNOSIS — F1410 Cocaine abuse, uncomplicated: Secondary | ICD-10-CM | POA: Diagnosis present

## 2023-02-21 DIAGNOSIS — Z91040 Latex allergy status: Secondary | ICD-10-CM | POA: Insufficient documentation

## 2023-02-21 DIAGNOSIS — I10 Essential (primary) hypertension: Secondary | ICD-10-CM | POA: Diagnosis not present

## 2023-02-21 DIAGNOSIS — K859 Acute pancreatitis without necrosis or infection, unspecified: Secondary | ICD-10-CM | POA: Diagnosis not present

## 2023-02-21 DIAGNOSIS — R1084 Generalized abdominal pain: Principal | ICD-10-CM

## 2023-02-21 DIAGNOSIS — J45909 Unspecified asthma, uncomplicated: Secondary | ICD-10-CM | POA: Diagnosis not present

## 2023-02-21 DIAGNOSIS — K861 Other chronic pancreatitis: Secondary | ICD-10-CM | POA: Diagnosis not present

## 2023-02-21 DIAGNOSIS — Z720 Tobacco use: Secondary | ICD-10-CM | POA: Diagnosis present

## 2023-02-21 DIAGNOSIS — I480 Paroxysmal atrial fibrillation: Secondary | ICD-10-CM | POA: Diagnosis not present

## 2023-02-21 DIAGNOSIS — R079 Chest pain, unspecified: Secondary | ICD-10-CM | POA: Diagnosis present

## 2023-02-21 DIAGNOSIS — Z79899 Other long term (current) drug therapy: Secondary | ICD-10-CM | POA: Diagnosis not present

## 2023-02-21 DIAGNOSIS — F17210 Nicotine dependence, cigarettes, uncomplicated: Secondary | ICD-10-CM | POA: Diagnosis not present

## 2023-02-21 DIAGNOSIS — N179 Acute kidney failure, unspecified: Secondary | ICD-10-CM | POA: Insufficient documentation

## 2023-02-21 DIAGNOSIS — F1010 Alcohol abuse, uncomplicated: Secondary | ICD-10-CM | POA: Diagnosis present

## 2023-02-21 LAB — CBC
HCT: 31.2 % — ABNORMAL LOW (ref 39.0–52.0)
Hemoglobin: 9.8 g/dL — ABNORMAL LOW (ref 13.0–17.0)
MCH: 26.9 pg (ref 26.0–34.0)
MCHC: 31.4 g/dL (ref 30.0–36.0)
MCV: 85.7 fL (ref 80.0–100.0)
Platelets: 192 10*3/uL (ref 150–400)
RBC: 3.64 MIL/uL — ABNORMAL LOW (ref 4.22–5.81)
RDW: 20 % — ABNORMAL HIGH (ref 11.5–15.5)
WBC: 6.9 10*3/uL (ref 4.0–10.5)
nRBC: 0 % (ref 0.0–0.2)

## 2023-02-21 LAB — BASIC METABOLIC PANEL
Anion gap: 10 (ref 5–15)
BUN: 12 mg/dL (ref 6–20)
CO2: 22 mmol/L (ref 22–32)
Calcium: 8.5 mg/dL — ABNORMAL LOW (ref 8.9–10.3)
Chloride: 106 mmol/L (ref 98–111)
Creatinine, Ser: 1.3 mg/dL — ABNORMAL HIGH (ref 0.61–1.24)
GFR, Estimated: >60 mL/min (ref >60)
Glucose, Bld: 90 mg/dL (ref 70–99)
Potassium: 4.5 mmol/L (ref 3.5–5.1)
Sodium: 138 mmol/L (ref 135–145)

## 2023-02-21 LAB — URINALYSIS, ROUTINE W REFLEX MICROSCOPIC
Bilirubin Urine: NEGATIVE
Glucose, UA: NEGATIVE mg/dL
Hgb urine dipstick: NEGATIVE
Ketones, ur: NEGATIVE mg/dL
Leukocytes,Ua: NEGATIVE
Nitrite: NEGATIVE
Protein, ur: NEGATIVE mg/dL
Specific Gravity, Urine: 1.011 (ref 1.005–1.030)
pH: 5 (ref 5.0–8.0)

## 2023-02-21 LAB — LIPASE, BLOOD: Lipase: 24 U/L (ref 11–51)

## 2023-02-21 LAB — TROPONIN I (HIGH SENSITIVITY): Troponin I (High Sensitivity): 4 ng/L (ref <18)

## 2023-02-21 MED ORDER — ONDANSETRON HCL 4 MG/2ML IJ SOLN
4.0000 mg | Freq: Once | INTRAMUSCULAR | Status: AC
  Administered 2023-02-21: 4 mg via INTRAVENOUS

## 2023-02-21 MED ORDER — HYDROMORPHONE HCL 1 MG/ML IJ SOLN
INTRAMUSCULAR | Status: AC
  Filled 2023-02-21: qty 1

## 2023-02-21 MED ORDER — ONDANSETRON HCL 4 MG/2ML IJ SOLN
INTRAMUSCULAR | Status: AC
  Filled 2023-02-21: qty 2

## 2023-02-21 MED ORDER — OXYCODONE HCL 5 MG PO TABS: 2.5000 mg | ORAL_TABLET | Freq: Four times a day (QID) | ORAL | Status: DC | PRN

## 2023-02-21 MED ORDER — HYDROMORPHONE HCL 1 MG/ML IJ SOLN: 0.5000 mg | INTRAMUSCULAR | Status: DC | PRN

## 2023-02-21 MED ORDER — HYDROMORPHONE HCL 1 MG/ML IJ SOLN
1.0000 mg | Freq: Once | INTRAMUSCULAR | Status: AC
  Administered 2023-02-21: 1 mg via INTRAVENOUS

## 2023-02-21 MED ORDER — DROPERIDOL 2.5 MG/ML IJ SOLN
INTRAMUSCULAR | Status: AC
  Filled 2023-02-21: qty 2

## 2023-02-21 MED ORDER — DROPERIDOL 2.5 MG/ML IJ SOLN
1.2500 mg | Freq: Once | INTRAMUSCULAR | Status: AC
  Administered 2023-02-21: 1.25 mg via INTRAVENOUS

## 2023-02-21 MED ORDER — SODIUM CHLORIDE 0.9 % IV SOLN
12.5000 mg | Freq: Once | INTRAVENOUS | Status: AC
  Administered 2023-02-21: 12.5 mg via INTRAVENOUS
  Filled 2023-02-21: qty 0.5

## 2023-02-21 MED ORDER — ENOXAPARIN SODIUM 40 MG/0.4ML IJ SOSY
40.0000 mg | PREFILLED_SYRINGE | INTRAMUSCULAR | Status: DC
  Administered 2023-02-22: 40 mg via SUBCUTANEOUS
  Filled 2023-02-21: qty 0.4

## 2023-02-21 MED ORDER — ACETAMINOPHEN 500 MG PO TABS: 1000.0000 mg | ORAL_TABLET | Freq: Four times a day (QID) | ORAL | Status: DC | PRN

## 2023-02-21 MED ORDER — HYDRALAZINE HCL 20 MG/ML IJ SOLN: 20.0000 mg | Freq: Once | INTRAMUSCULAR | Status: DC

## 2023-02-21 MED ORDER — SODIUM CHLORIDE 0.9 % IV BOLUS
1000.0000 mL | Freq: Once | INTRAVENOUS | Status: AC
  Administered 2023-02-21: 1000 mL via INTRAVENOUS

## 2023-02-21 MED ORDER — OXYCODONE HCL 5 MG PO TABS
5.0000 mg | ORAL_TABLET | Freq: Four times a day (QID) | ORAL | Status: DC | PRN
  Administered 2023-02-21: 5 mg via ORAL
  Filled 2023-02-21: qty 1

## 2023-02-21 NOTE — ED Triage Notes (Signed)
Pt arrived via [facility] from home for c/o non radiating misternal chest pain, RUQ, LUQ abdominal pain, N/V, urinary retentionx2d.

## 2023-02-21 NOTE — ED Notes (Signed)
Patient transported to X-ray 

## 2023-02-21 NOTE — ED Provider Triage Note (Signed)
Emergency Medicine Provider Triage Evaluation Note  [name] , a 52 y.o. male  was evaluated in triage.  Pt complains of dysuria, abdominal pain. States that he doesn't feel like he is emptying his bladder fully when he urinates. Notes nausea with vomiting improved with home zofran. Pain consistent with his chronic pancreatitis.  Review of Systems  Positive:  Negative:   Physical Exam  BP (!) 137/95   Pulse 70   Temp 98.2 F (36.8 C) (Oral)   Resp 18   Ht 5\' 8"  (1.727 m)   Wt 54.9 kg   SpO2 100%   BMI 18.40 kg/m  Gen:   Awake, no distress   Resp:  Normal effort MSK:   Moves extremities without difficulty  Other:    Medical Decision Making  Medically screening exam initiated at 4:29 PM.  Appropriate orders placed.  Nirvan Kionte Baumgardner was informed that the remainder of the evaluation will be completed by another provider, this initial triage assessment does not replace that evaluation, and the importance of remaining in the ED until their evaluation is complete.     Silva Bandy, PA-C 02/21/23 [phone]

## 2023-02-21 NOTE — ED Notes (Signed)
ED TO INPATIENT HANDOFF REPORT  ED Nurse Name and Phone #: France Ravens [phone]  S Name/Age/Gender [name] 52 y.o. male Room/Bed: 008C/008C  Code Status   Code Status: Prior  Home/SNF/Other Home Patient oriented to: self, place, time, and situation Is this baseline? Yes   Triage Complete: Triage complete  Chief Complaint Acute on chronic pancreatitis (HCC) [K85.90, K86.1]  Triage Note Pt arrived via [facility] from home for c/o non radiating misternal chest pain, RUQ, LUQ abdominal pain, N/V, urinary retentionx2d.   Allergies Allergies  Allergen Reactions   Robaxin [Methocarbamol] Other (See Comments)    jumpy limbs   Aspirin Other (See Comments)    Unknown reaction   Shellfish-Derived Products Nausea And Vomiting and Rash   Trazodone And Nefazodone Other (See Comments)    Muscle spasms   Adhesive [Tape] Itching   Latex Itching   Toradol [Ketorolac Tromethamine] Other (See Comments)    Has ulcers; cannot have this   Contrast Media [Iodinated Contrast Media] Hives   Reglan [Metoclopramide] Other (See Comments)    Muscle spasms   Salmon [Fish Oil] Nausea And Vomiting and Rash    Level of Care/Admitting Diagnosis ED Disposition     ED Disposition  Admit   Condition  --   Comment  Hospital Area: [facility] [[id number]]  Level of Care: Med-Surg [16]  May place patient in observation at [facility] or [facility] if equivalent level of care is available:: No  Covid Evaluation: Asymptomatic - no recent exposure (last 10 days) testing not required  Diagnosis: Acute on chronic pancreatitis [facility]) [[id number]]  Admitting Physician: Dolly Rias [[id number]]  Attending Physician: Dolly Rias [[id number]]          B Medical/Surgery History Past Medical History:  Diagnosis Date   Alcoholism /alcohol abuse    Anemia    Anxiety    Arthritis    knees; arms; elbows (03/26/2015)   Asthma    Bipolar disorder (HCC)    Chronic bronchitis (HCC)     Chronic lower back pain    Chronic pancreatitis (HCC)    Cocaine abuse (HCC)    Depression    Family history of adverse reaction to anesthesia    Femoral condyle fracture (HCC) 03/08/2014   left medial/notes 03/09/2014   GERD (gastroesophageal reflux disease)    H/O hiatal hernia    H/O suicide attempt 10/2012   High cholesterol    History of blood transfusion 10/2012   when I tried to commit suicide   History of stomach ulcers    Hypertension    Marijuana abuse, continuous    Migraine    a few times/year (03/26/2015)   Pancreatitis    Pneumonia 1990's X 3   PTSD (post-traumatic stress disorder)    Seizures (HCC)    Sickle cell trait (HCC)    WPW (Wolff-Parkinson-White syndrome)    [name] 03/06/2013   Past Surgical History:  Procedure Laterality Date   BIOPSY  11/25/2017   Procedure: BIOPSY;  Surgeon: [name] Modena, MD;  Location: [facility] ENDOSCOPY;  Service: Endoscopy;;   BIOPSY  10/14/2018   Procedure: BIOPSY;  Surgeon: [name] Modena, MD;  Location: [facility] ENDOSCOPY;  Service: Endoscopy;;   CARDIAC CATHETERIZATION     CYST ENTEROSTOMY  01/02/2020   Procedure: CYST ASPIRATION;  Surgeon: Rachael Fee, MD;  Location: [facility] ENDOSCOPY;  Service: Endoscopy;;   ESOPHAGOGASTRODUODENOSCOPY (EGD) WITH PROPOFOL N/A 11/25/2017   Procedure: ESOPHAGOGASTRODUODENOSCOPY (EGD) WITH PROPOFOL;  Surgeon: [name] Modena, MD;  Location: [facility]  ENDOSCOPY;  Service: Endoscopy;  Laterality: N/A;   ESOPHAGOGASTRODUODENOSCOPY (EGD) WITH PROPOFOL Left 10/14/2018   Procedure: ESOPHAGOGASTRODUODENOSCOPY (EGD) WITH PROPOFOL;  Surgeon: [name] Modena, MD;  Location: [facility] ENDOSCOPY;  Service: Endoscopy;  Laterality: Left;   ESOPHAGOGASTRODUODENOSCOPY (EGD) WITH PROPOFOL N/A 11/14/2018   Procedure: ESOPHAGOGASTRODUODENOSCOPY (EGD) WITH PROPOFOL;  Surgeon: Carman Ching, MD;  Location: [facility] ENDOSCOPY;  Service: Gastroenterology;  Laterality: N/A;   ESOPHAGOGASTRODUODENOSCOPY (EGD) WITH PROPOFOL N/A 01/02/2020   Procedure:  ESOPHAGOGASTRODUODENOSCOPY (EGD) WITH PROPOFOL;  Surgeon: Rachael Fee, MD;  Location: [facility] ENDOSCOPY;  Service: Endoscopy;  Laterality: N/A;   ESOPHAGOGASTRODUODENOSCOPY (EGD) WITH PROPOFOL N/A 10/25/2020   Procedure: ESOPHAGOGASTRODUODENOSCOPY (EGD) WITH PROPOFOL;  Surgeon: Meridee Score Netty Starring., MD;  Location: [facility] ENDOSCOPY;  Service: Gastroenterology;  Laterality: N/A;   EUS N/A 01/02/2020   Procedure: UPPER ENDOSCOPIC ULTRASOUND (EUS) RADIAL;  Surgeon: Rachael Fee, MD;  Location: [facility] ENDOSCOPY;  Service: Endoscopy;  Laterality: N/A;   EYE SURGERY Left 1990's   "result of trauma"    FACIAL FRACTURE SURGERY Left 1990's   "result of trauma"    FLEXIBLE SIGMOIDOSCOPY N/A 10/25/2020   Procedure: FLEXIBLE SIGMOIDOSCOPY;  Surgeon: Lemar Lofty., MD;  Location: [facility] ENDOSCOPY;  Service: Gastroenterology;  Laterality: N/A;   FRACTURE SURGERY     HEMOSTASIS CLIP PLACEMENT  10/25/2020   Procedure: HEMOSTASIS CLIP PLACEMENT;  Surgeon: Lemar Lofty., MD;  Location: [facility] ENDOSCOPY;  Service: Gastroenterology;;   HERNIA REPAIR     LEFT HEART CATHETERIZATION WITH CORONARY ANGIOGRAM Right 03/07/2013   Procedure: LEFT HEART CATHETERIZATION WITH CORONARY ANGIOGRAM;  Surgeon: Ricki Rodriguez, MD;  Location: [facility] CATH LAB;  Service: Cardiovascular;  Laterality: Right;   UMBILICAL HERNIA REPAIR     UPPER GASTROINTESTINAL ENDOSCOPY       A IV Location/Drains/Wounds Patient Lines/Drains/Airways Status     Active Line/Drains/Airways     Name Placement date Placement time Site Days   Peripheral IV 02/21/23 20 G Left;Posterior Forearm 02/21/23  2144  Forearm  less than 1            Intake/Output Last 24 hours No intake or output data in the 24 hours ending 02/21/23 2334  Labs/Imaging Results for orders placed or performed during the hospital encounter of 02/21/23 (from the past 48 hour(s))  Basic metabolic panel     Status: Abnormal   Collection Time: 02/21/23  3:43 PM  Result  Value Ref Range   Sodium 138 135 - 145 mmol/L   Potassium 4.5 3.5 - 5.1 mmol/L   Chloride 106 98 - 111 mmol/L   CO2 22 22 - 32 mmol/L   Glucose, Bld 90 70 - 99 mg/dL    Comment: Glucose reference range applies only to samples taken after fasting for at least 8 hours.   BUN 12 6 - 20 mg/dL   Creatinine, Ser 1.61 (H) 0.61 - 1.24 mg/dL   Calcium 8.5 (L) 8.9 - 10.3 mg/dL   GFR, Estimated >09 >60 mL/min    Comment: (NOTE) Calculated using the CKD-EPI Creatinine Equation (2021)    Anion gap 10 5 - 15    Comment: Performed at [facility], [address], [place], [place] [address]  CBC     Status: Abnormal   Collection Time: 02/21/23  3:43 PM  Result Value Ref Range   WBC 6.9 4.0 - 10.5 K/uL   RBC 3.64 (L) 4.22 - 5.81 MIL/uL   Hemoglobin 9.8 (L) 13.0 - 17.0 g/dL   HCT 81.1 (L) 91.4 -  52.0 %   MCV 85.7 80.0 - 100.0 fL   MCH 26.9 26.0 - 34.0 pg   MCHC 31.4 30.0 - 36.0 g/dL   RDW 40.9 (H) 81.1 - 91.4 %   Platelets 192 150 - 400 K/uL   nRBC 0.0 0.0 - 0.2 %    Comment: Performed at [facility], [address], [place], [place] [address]  Troponin I (High Sensitivity)     Status: None   Collection Time: 02/21/23  3:43 PM  Result Value Ref Range   Troponin I (High Sensitivity) 4 <18 ng/L    Comment: (NOTE) Elevated high sensitivity troponin I (hsTnI) values and significant  changes across serial measurements may suggest ACS but many other  chronic and acute conditions are known to elevate hsTnI results.  Refer to the "Links" section for chest pain algorithms and additional  guidance. Performed at [facility], [address], [place], [place] [address]   Lipase, blood     Status: None   Collection Time: 02/21/23  3:43 PM  Result Value Ref Range   Lipase 24 11 - 51 U/L    Comment: Performed at [facility], [address], [place], [place] [address]  Urinalysis, Routine w reflex microscopic -Urine, Clean Catch     Status: None   Collection Time: 02/21/23   3:50 PM  Result Value Ref Range   Color, Urine YELLOW YELLOW   APPearance CLEAR CLEAR   Specific Gravity, Urine 1.011 1.005 - 1.030   pH 5.0 5.0 - 8.0   Glucose, UA NEGATIVE NEGATIVE mg/dL   Hgb urine dipstick NEGATIVE NEGATIVE   Bilirubin Urine NEGATIVE NEGATIVE   Ketones, ur NEGATIVE NEGATIVE mg/dL   Protein, ur NEGATIVE NEGATIVE mg/dL   Nitrite NEGATIVE NEGATIVE   Leukocytes,Ua NEGATIVE NEGATIVE    Comment: Performed at [facility], [address], [place], [place] [address]   DG Chest 2 View  Result Date: 02/21/2023 CLINICAL DATA:  Chest pain. Right upper quadrant and left upper quadrant abdominal pain. EXAM: CHEST - 2 VIEW COMPARISON:  02/19/2023 FINDINGS: Midline trachea. Normal heart size and mediastinal contours. No pleural effusion or pneumothorax. Mild hyperinflation and interstitial thickening, consistent with the clinical history of smoking. No superimposed consolidation. IMPRESSION: No acute cardiopulmonary disease. Peribronchial thickening which may relate to chronic bronchitis or smoking. Electronically Signed   By: Jeronimo Greaves M.D.   On: 02/21/2023 18:18    Pending Labs Unresulted Labs (From admission, onward)     Start     Ordered   02/21/23 2245  Ethanol  Add-on,   AD        02/21/23 2244   02/21/23 2245  Rapid urine drug screen (hospital performed)  Add-on,   AD        02/21/23 2244            Vitals/Pain Today's Vitals   02/21/23 2206 02/21/23 2210 02/21/23 2230 02/21/23 2239  BP:  (!) 221/112 (!) 167/100   Pulse:  71 60   Resp:  13 17   Temp:      TempSrc:      SpO2:  100% 100%   Weight:      Height:      PainSc: 8    8     Isolation Precautions No active isolations  Medications Medications  promethazine (PHENERGAN) 12.5 mg in sodium chloride 0.9 % 50 mL IVPB (12.5 mg Intravenous New Bag/Given 02/21/23 2319)  enoxaparin (LOVENOX)  injection 40 mg (has no administration in time range)  acetaminophen (TYLENOL) tablet 1,000 mg (has no  administration in time range)  oxyCODONE (Oxy IR/ROXICODONE) immediate release tablet 2.5 mg (has no administration in time range)    Or  oxyCODONE (Oxy IR/ROXICODONE) immediate release tablet 5 mg (has no administration in time range)  HYDROmorphone (DILAUDID) injection 0.5 mg (has no administration in time range)  HYDROmorphone (DILAUDID) injection 1 mg ( Intravenous Canceled Entry 02/21/23 2242)  droperidol (INAPSINE) 2.5 MG/ML injection 1.25 mg ( Intravenous Canceled Entry 02/21/23 2240)  sodium chloride 0.9 % bolus 1,000 mL (1,000 mLs Intravenous New Bag/Given 02/21/23 2205)  ondansetron (ZOFRAN) injection 4 mg ( Intravenous Canceled Entry 02/21/23 2241)    Mobility walks     Focused Assessments    R Recommendations: See Admitting Provider Note  Report given to:   Additional Notes:

## 2023-02-21 NOTE — ED Provider Notes (Signed)
[place] EMERGENCY DEPARTMENT AT [facility] Provider Note   CSN: [id number] Arrival date & time: 02/21/23  1532     History  Chief Complaint  Patient presents with   Abdominal Pain   Chest Pain    [name] is a 52 y.o. male with a history of chronic pancreatitis, alcohol use disorder, cocaine and marijuana use, hypertension, bipolar disorder, presenting to the ED with acute onset of lower abdominal pain, nausea vomiting.  Patient reports his symptoms are similar to when he was seen in the ED 2 days ago.  Takes carafate at home, no relief  Denies THC use in 1 week, denies drug use, denies etoh use  Reports pain in his chest, shoulders, pelvis, bladder and back as well   HPI     Home Medications Prior to Admission medications   Medication Sig Start Date End Date Taking? Authorizing Provider  famotidine (PEPCID) 20 MG tablet Take 20 mg by mouth 2 (two) times daily.   Yes [provider]  fluticasone (FLONASE) 50 MCG/ACT nasal spray Place 2 sprays into both nostrils 2 (two) times daily as needed for allergies.   Yes [provider]  folic acid (FOLVITE) 1 MG tablet Take 1 tablet (1 mg total) by mouth daily. 09/21/22  Yes Pokhrel, Laxman, MD  lipase/protease/amylase (CREON) 36000 UNITS CPEP capsule Take 2 capsules (72,000 Units total) by mouth 3 (three) times daily with meals. May also take 1 capsule (36,000 Units total) as needed (with snacks). 01/26/23  Yes Jerald Kief, MD  metoprolol (TOPROL-XL) 200 MG 24 hr tablet Take 200 mg by mouth daily. 11/30/22  Yes [provider]  nitroGLYCERIN (NITROSTAT) 0.4 MG SL tablet Place 0.4 mg under the tongue every 5 (five) minutes as needed for chest pain.   Yes [provider]  ondansetron (ZOFRAN-ODT) 4 MG disintegrating tablet Take 1 tablet (4 mg total) by mouth 2 (two) times daily as needed for nausea or vomiting. Patient taking differently: Take 4 mg by mouth as needed for  nausea or vomiting. 01/26/23  Yes Jerald Kief, MD  pantoprazole (PROTONIX) 40 MG tablet Take 1 tablet (40 mg total) by mouth 2 (two) times daily before a meal. 02/11/23 03/13/23 Yes Dahal, Melina Schools, MD  Simethicone 40 MG/0.6ML LIQD Take 0.6 mLs (40 mg total) by mouth in the morning, at noon, and at bedtime for 7 days. Patient taking differently: Take 40 mg by mouth 3 (three) times daily as needed (for bloating and gas). 02/11/23 02/21/24 Yes Dahal, Melina Schools, MD  sucralfate (CARAFATE) 1 GM/10ML suspension Take 10 mLs (1 g total) by mouth See admin instructions. Take 4 times with meals and at bedtime. Patient taking differently: Take 1 g by mouth See admin instructions. Take 1g by mouth three times a day with meals.  May take an additional 1g with a snack. 01/11/23  Yes Jerald Kief, MD  amitriptyline (ELAVIL) 25 MG tablet Take 1 tablet (25 mg total) by mouth at bedtime. Patient not taking: Reported on 08/08/2019 10/15/18 08/08/19  Rolly Salter, MD      Allergies    Robaxin [methocarbamol], Aspirin, Shellfish-derived products, Trazodone and nefazodone, Adhesive [tape], Latex, Toradol [ketorolac tromethamine], Contrast media [iodinated contrast media], Reglan [metoclopramide], and Salmon [fish oil]    Review of Systems   Review of Systems  Physical Exam Updated Vital Signs BP (!) 167/100   Pulse 60   Temp 98.2 F (36.8 C)   Resp 17   Ht 5'  8" (1.727 m)   Wt 54.9 kg   SpO2 100%   BMI 18.40 kg/m  Physical Exam Constitutional:      General: He is not in acute distress. HENT:     Head: Normocephalic and atraumatic.  Eyes:     Conjunctiva/sclera: Conjunctivae normal.     Pupils: Pupils are equal, round, and reactive to light.  Cardiovascular:     Rate and Rhythm: Normal rate and regular rhythm.  Pulmonary:     Effort: Pulmonary effort is normal. No respiratory distress.  Abdominal:     General: There is no distension.     Tenderness: There is generalized abdominal tenderness.  Skin:     General: Skin is warm and dry.  Neurological:     General: No focal deficit present.     Mental Status: He is alert. Mental status is at baseline.  Psychiatric:        Mood and Affect: Mood normal.        Behavior: Behavior normal.     ED Results / Procedures / Treatments   Labs (all labs ordered are listed, but only abnormal results are displayed) Labs Reviewed  BASIC METABOLIC PANEL - Abnormal; Notable for the following components:      Result Value   Creatinine, Ser 1.30 (*)    Calcium 8.5 (*)    All other components within normal limits  CBC - Abnormal; Notable for the following components:   RBC 3.64 (*)    Hemoglobin 9.8 (*)    HCT 31.2 (*)    RDW 20.0 (*)    All other components within normal limits  LIPASE, BLOOD  URINALYSIS, ROUTINE W REFLEX MICROSCOPIC  ETHANOL  RAPID URINE DRUG SCREEN, HOSP PERFORMED  TROPONIN I (HIGH SENSITIVITY)    EKG EKG Interpretation Date/Time:  Tuesday February 21 2023 15:39:12 EDT Ventricular Rate:  71 PR Interval:  100 QRS Duration:  82 QT Interval:  378 QTC Calculation: 410 R Axis:   138  Text Interpretation: Sinus rhythm with short PR with Premature atrial complexes with Abberant conduction Left posterior fascicular block Possible Inferior infarct , age undetermined T wave abnormality, consider anterolateral ischemia Abnormal ECG When compared with ECG of 19-Feb-2023 18:50, PREVIOUS ECG IS PRESENT Confirmed by Alvester Chou [phone]) on 02/21/2023 9:24:00 PM  Radiology DG Chest 2 View  Result Date: 02/21/2023 CLINICAL DATA:  Chest pain. Right upper quadrant and left upper quadrant abdominal pain. EXAM: CHEST - 2 VIEW COMPARISON:  02/19/2023 FINDINGS: Midline trachea. Normal heart size and mediastinal contours. No pleural effusion or pneumothorax. Mild hyperinflation and interstitial thickening, consistent with the clinical history of smoking. No superimposed consolidation. IMPRESSION: No acute cardiopulmonary disease. Peribronchial  thickening which may relate to chronic bronchitis or smoking. Electronically Signed   By: Jeronimo Greaves M.D.   On: 02/21/2023 18:18    Procedures Procedures    Medications Ordered in ED Medications  promethazine (PHENERGAN) 12.5 mg in sodium chloride 0.9 % 50 mL IVPB (12.5 mg Intravenous New Bag/Given 02/21/23 2319)  HYDROmorphone (DILAUDID) injection 1 mg ( Intravenous Canceled Entry 02/21/23 2242)  droperidol (INAPSINE) 2.5 MG/ML injection 1.25 mg ( Intravenous Canceled Entry 02/21/23 2240)  sodium chloride 0.9 % bolus 1,000 mL (1,000 mLs Intravenous New Bag/Given 02/21/23 2205)  ondansetron (ZOFRAN) injection 4 mg ( Intravenous Canceled Entry 02/21/23 2241)    ED Course/ Medical Decision Making/ A&P Clinical Course as of 02/21/23 2331  Tue Feb 21, 2023  2243 Blood pressure had improved, IV hydralazine discontinued.  Patient is still having intractable abdominal pain reports inability to tolerate p.o.  Suspect this is likely acute on chronic pancreatitis.  Given the persistency of the symptoms I will call the hospitalist regarding admission [MT]  2330 Admitted to hospitalist [MT]    Clinical Course User Index [MT] Sailor Haughn, Kermit Balo, MD                                 Medical Decision Making Amount and/or Complexity of Data Reviewed Labs: ordered. Radiology: ordered.  Risk Prescription drug management. Decision regarding hospitalization.   This patient presents to the ED with concern for abdominal pain, nausea, vomiting. This involves an extensive number of treatment options, and is a complaint that carries with it a high risk of complications and morbidity.  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  The pertinent results include:  no emergent findings; lipase and WBC and UA unremarkable  I ordered imaging studies including dg chest I independently visualized and interpreted imaging which showed no emergent findings I agree with the radiologist interpretation  The patient was maintained on a cardiac monitor.  I personally viewed and interpreted the cardiac monitored which showed an underlying rhythm of: NSR  Per my interpretation the patient's ECG shows no acute ischemic findings  I ordered medication including IV pain and nausea medication and IV fluids for suspected acute on chronic pancreatitis.  IV droperidol for potential hyperemesis syndrome  I have reviewed the patients home medicines and have made adjustments as needed  Test Considered: Patient has had multiple CT scans and a recent MRI of the abdomen for chronic pancreatitis.  I do not see indication to repeat the scan emergently at this time.  I have a low suspicion for bowel obstruction as he is passing gas and having bowel movements, and I doubt a bacterial infection or surgical emergency of the abdomen.  After the interventions noted above, I reevaluated the patient and found that they have: stayed the same  Dispostion:  After consideration of the diagnostic results and the patients response to treatment, I feel that the patent would benefit from medical admission.         Final Clinical Impression(s) / ED Diagnoses Final diagnoses:  Generalized abdominal pain    Rx / DC Orders ED Discharge Orders     None         Terald Sleeper, MD 02/21/23 [phone]

## 2023-02-22 DIAGNOSIS — K860 Alcohol-induced chronic pancreatitis: Secondary | ICD-10-CM | POA: Diagnosis not present

## 2023-02-22 DIAGNOSIS — K861 Other chronic pancreatitis: Secondary | ICD-10-CM | POA: Diagnosis not present

## 2023-02-22 DIAGNOSIS — K859 Acute pancreatitis without necrosis or infection, unspecified: Secondary | ICD-10-CM | POA: Diagnosis not present

## 2023-02-22 LAB — HEPATIC FUNCTION PANEL
ALT: 22 U/L (ref 0–44)
ALT: 23 U/L (ref 0–44)
AST: 35 U/L (ref 15–41)
AST: 37 U/L (ref 15–41)
Albumin: 3.5 g/dL (ref 3.5–5.0)
Albumin: 3.8 g/dL (ref 3.5–5.0)
Alkaline Phosphatase: 76 U/L (ref 38–126)
Alkaline Phosphatase: 82 U/L (ref 38–126)
Bilirubin, Direct: 0.1 mg/dL (ref 0.0–0.2)
Bilirubin, Direct: <0.1 mg/dL (ref 0.0–0.2)
Indirect Bilirubin: 0.5 mg/dL (ref 0.3–0.9)
Total Bilirubin: 0.4 mg/dL (ref 0.3–1.2)
Total Bilirubin: 0.6 mg/dL (ref 0.3–1.2)
Total Protein: 6.7 g/dL (ref 6.5–8.1)
Total Protein: 7.3 g/dL (ref 6.5–8.1)

## 2023-02-22 LAB — BASIC METABOLIC PANEL
Anion gap: 12 (ref 5–15)
BUN: 9 mg/dL (ref 6–20)
CO2: 19 mmol/L — ABNORMAL LOW (ref 22–32)
Calcium: 8.5 mg/dL — ABNORMAL LOW (ref 8.9–10.3)
Chloride: 104 mmol/L (ref 98–111)
Creatinine, Ser: 1.07 mg/dL (ref 0.61–1.24)
GFR, Estimated: >60 mL/min (ref >60)
Glucose, Bld: 105 mg/dL — ABNORMAL HIGH (ref 70–99)
Potassium: 4.1 mmol/L (ref 3.5–5.1)
Sodium: 135 mmol/L (ref 135–145)

## 2023-02-22 LAB — RAPID URINE DRUG SCREEN, HOSP PERFORMED
Amphetamines: POSITIVE — AB
Barbiturates: NOT DETECTED
Benzodiazepines: NOT DETECTED
Cocaine: POSITIVE — AB
Opiates: NOT DETECTED
Tetrahydrocannabinol: NOT DETECTED

## 2023-02-22 LAB — CBC
HCT: 31.8 % — ABNORMAL LOW (ref 39.0–52.0)
Hemoglobin: 9.7 g/dL — ABNORMAL LOW (ref 13.0–17.0)
MCH: 25.7 pg — ABNORMAL LOW (ref 26.0–34.0)
MCHC: 30.5 g/dL (ref 30.0–36.0)
MCV: 84.4 fL (ref 80.0–100.0)
Platelets: 187 10*3/uL (ref 150–400)
RBC: 3.77 MIL/uL — ABNORMAL LOW (ref 4.22–5.81)
RDW: 20 % — ABNORMAL HIGH (ref 11.5–15.5)
WBC: 8.6 10*3/uL (ref 4.0–10.5)
nRBC: 0 % (ref 0.0–0.2)

## 2023-02-22 LAB — PROTIME-INR
INR: 1 (ref 0.8–1.2)
Prothrombin Time: 13.1 s (ref 11.4–15.2)

## 2023-02-22 LAB — PHOSPHORUS: Phosphorus: 3.9 mg/dL (ref 2.5–4.6)

## 2023-02-22 LAB — TROPONIN I (HIGH SENSITIVITY): Troponin I (High Sensitivity): 4 ng/L (ref <18)

## 2023-02-22 LAB — MAGNESIUM: Magnesium: 1.2 mg/dL — ABNORMAL LOW (ref 1.7–2.4)

## 2023-02-22 LAB — ETHANOL: Alcohol, Ethyl (B): <10 mg/dL (ref <10)

## 2023-02-22 MED ORDER — ALUM & MAG HYDROXIDE-SIMETH 200-200-20 MG/5ML PO SUSP
30.0000 mL | ORAL | Status: DC | PRN
  Administered 2023-02-22: 30 mL via ORAL
  Filled 2023-02-22: qty 30

## 2023-02-22 MED ORDER — SUCRALFATE 1 GM/10ML PO SUSP
1.0000 g | Freq: Three times a day (TID) | ORAL | Status: DC
  Administered 2023-02-22 (×2): 1 g via ORAL
  Filled 2023-02-22 (×2): qty 10

## 2023-02-22 MED ORDER — SUCRALFATE 1 GM/10ML PO SUSP: 1.0000 g | Freq: Four times a day (QID) | ORAL | Status: DC

## 2023-02-22 MED ORDER — ONDANSETRON HCL 4 MG/2ML IJ SOLN
4.0000 mg | Freq: Four times a day (QID) | INTRAMUSCULAR | Status: DC | PRN
  Administered 2023-02-22: 4 mg via INTRAVENOUS
  Filled 2023-02-22: qty 2

## 2023-02-22 MED ORDER — PANCRELIPASE (LIP-PROT-AMYL) 36000-114000 UNITS PO CPEP
72000.0000 [IU] | ORAL_CAPSULE | Freq: Three times a day (TID) | ORAL | Status: DC
  Administered 2023-02-22 (×2): 72000 [IU] via ORAL
  Filled 2023-02-22 (×2): qty 2

## 2023-02-22 MED ORDER — FAMOTIDINE 20 MG PO TABS
20.0000 mg | ORAL_TABLET | Freq: Two times a day (BID) | ORAL | Status: DC
  Administered 2023-02-22 (×2): 20 mg via ORAL
  Filled 2023-02-22 (×2): qty 1

## 2023-02-22 MED ORDER — PANTOPRAZOLE SODIUM 40 MG PO TBEC
40.0000 mg | DELAYED_RELEASE_TABLET | Freq: Two times a day (BID) | ORAL | Status: DC
  Administered 2023-02-22: 40 mg via ORAL
  Filled 2023-02-22: qty 1

## 2023-02-22 MED ORDER — SIMETHICONE 40 MG/0.6ML PO SUSP: 40.0000 mg | Freq: Three times a day (TID) | ORAL | Status: DC | PRN

## 2023-02-22 MED ORDER — ACETAMINOPHEN 500 MG PO TABS
1000.0000 mg | ORAL_TABLET | Freq: Four times a day (QID) | ORAL | Status: DC
  Administered 2023-02-22 (×2): 1000 mg via ORAL
  Filled 2023-02-22 (×2): qty 2

## 2023-02-22 MED ORDER — PANCRELIPASE (LIP-PROT-AMYL) 36000-114000 UNITS PO CPEP: 36000.0000 [IU] | ORAL_CAPSULE | ORAL | Status: DC | PRN

## 2023-02-22 MED ORDER — PREGABALIN 75 MG PO CAPS
75.0000 mg | ORAL_CAPSULE | Freq: Two times a day (BID) | ORAL | Status: DC
  Administered 2023-02-22 (×2): 75 mg via ORAL
  Filled 2023-02-22 (×2): qty 1

## 2023-02-22 MED ORDER — METOPROLOL SUCCINATE ER 50 MG PO TB24: 200.0000 mg | ORAL_TABLET | Freq: Every day | ORAL | Status: DC

## 2023-02-22 MED ORDER — MAGNESIUM SULFATE 4 GM/100ML IV SOLN
4.0000 g | Freq: Once | INTRAVENOUS | Status: AC
  Administered 2023-02-22: 4 g via INTRAVENOUS
  Filled 2023-02-22: qty 100

## 2023-02-22 MED ORDER — SUCRALFATE 1 GM/10ML PO SUSP: 1.0000 g | ORAL | Status: DC | PRN

## 2023-02-22 MED ORDER — OXYCODONE HCL 5 MG PO TABS
2.5000 mg | ORAL_TABLET | Freq: Four times a day (QID) | ORAL | Status: DC | PRN
  Administered 2023-02-22 (×2): 2.5 mg via ORAL
  Filled 2023-02-22 (×2): qty 1

## 2023-02-22 MED ORDER — NICOTINE 7 MG/24HR TD PT24
7.0000 mg | MEDICATED_PATCH | Freq: Every day | TRANSDERMAL | Status: DC
  Administered 2023-02-22: 7 mg via TRANSDERMAL
  Filled 2023-02-22: qty 1

## 2023-02-22 NOTE — Progress Notes (Signed)
Patient was informed he was being discharged. Patient seen leaving nursing unit. Patient still had IV in his arm and had not received paperwork. Patient got onto elevator. Security button pulled.   This RN caught up to patient and stopped elevator doors from closing. Patient informed he has to have his IV out before leaving the hospital. Patient states that he was told he was being discharged so he is leaving. Patient refused to let nurse remove IV. Patient states "get the fuck away from me. Ill take it out myself". Patient exited elevator and headed towards [facility]. Patient followed by RN. Patient attempted to exit out glass doors. Doors thankfully locked. This RN yelled for help from security at [facility] entrance. Security came to assist. Patient belligerent towards security and staff. Patient throwing belongings. Patient informed again that he could not leave until his IV was removed. Patient stated that everyone needed to stay away from him. Patient asked if nursing staff could approach and remove his IV then he could leave. Patient stated that as long as security stayed away from that nursing could remove IV. IV removed from patient. Patient deescalated and informed that his discharge paperwork is back on [address] and he can come to get it. Patient agreed to follow nursing back to unit to get paperwork. Paperwork reviewed with patient. Patient given bus passes and wheeled to main entrance by this RN.

## 2023-02-22 NOTE — Discharge Summary (Signed)
[name] QIO:[id number] DOB: 20-Apr-1971 DOA: 02/21/2023  PCP: Default, Provider, MD  Admit date: 02/21/2023 Discharge date: 02/22/2023  Time spent: 35 minutes  Recommendations for Outpatient Follow-up:  GI procedure 10/24 as scheduled Pain clinic appointment and GI f/u are both scheduled for later this month, per patient     Discharge Diagnoses:  Active Problems:   Acute on chronic pancreatitis (HCC)   Paroxysmal atrial fibrillation (HCC)   Alcohol abuse   Tobacco abuse   Essential hypertension   Cocaine abuse (HCC)   Discharge Condition: stable  Diet recommendation: low fat  Filed Weights   02/21/23 1535  Weight: 54.9 kg    History of present illness:  From admission h and p [name] is a 52 y.o. male with hx of chronic alcoholic pancreatitis, with pancreatic pseudocyst, paroxysmal atrial fibrillation not on anticoagulation, Wolff-Parkinson-White syndrome s/p ablation, hypertension, bipolar disorder, GERD, and polysubstance abuse(alcohol, tobacco, cocaine, marijuana), who presents with worsening of his chronic abd pain. Reports that he has been unable to tolerate any significant oral intake over the past 3 days.  Notes that when he eats fatty/greasy food like macaroni and cheese, fried chicken, eggs and grits that this worsens his pain.  Vomited after eggs and grits this morning.  He has chronic worsened pain in the epigastrium/left upper quadrant.  Associated he also has pain that radiates down to his groin.  Reports that when he gets dehydrated this groin pain sometimes appears.  And otherwise he reports difficulty with voiding and straining.  He has run out of his opiate medications.  His chronic pain is typically a 3-5 out of 10, currently rated at a 7.   Hospital Course:  Patient presents with chronic abdominal pain from chronic pancreatitis with pseudocyst. No significant lab abnormalities. GI consulted, advised continue current plan which is EUS  with FNA and possible celiac block next week with dr. Meridee Moran. Patient also has f/u later this month with his gastroenterologist and with a pain clinic. Polysubstance abuse continues with UDS positive for cocaine and amphetamines. Patient is ambulating without assistance and tolerating solid foods, stable for discharge.   Procedures: none   Consultations: GI  Discharge Exam: Vitals:   02/22/23 0739 02/22/23 1131  BP: (!) 170/95   Pulse: 67   Resp: 18 16  Temp: 97.7 F (36.5 C)   SpO2: 100%     General: NAD Cardiovascular: RRR Respiratory: CTAB Abdomen: mild distention, ttp epigastrum, positive bowel sounds Ext: warm, no edema  Discharge Instructions   Discharge Instructions     Diet - low sodium heart healthy   Complete by: As directed    Increase activity slowly   Complete by: As directed       Allergies as of 02/22/2023       Reactions   Robaxin [methocarbamol] Other (See Comments)   jumpy limbs   Aspirin Other (See Comments)   Unknown reaction   Shellfish-derived Products Nausea And Vomiting, Rash   Trazodone And Nefazodone Other (See Comments)   Muscle spasms   Adhesive [tape] Itching   Latex Itching   Toradol [ketorolac Tromethamine] Other (See Comments)   Has ulcers; cannot have this   Contrast Media [iodinated Contrast Media] Hives   Reglan [metoclopramide] Other (See Comments)   Muscle spasms   Salmon [fish Oil] Nausea And Vomiting, Rash        Medication List     TAKE these medications    famotidine 20 MG tablet Commonly known as: PEPCID  Take 20 mg by mouth 2 (two) times daily.   fluticasone 50 MCG/ACT nasal spray Commonly known as: FLONASE Place 2 sprays into both nostrils 2 (two) times daily as needed for allergies.   folic acid 1 MG tablet Commonly known as: FOLVITE Take 1 tablet (1 mg total) by mouth daily.   lipase/protease/amylase 16109 UNITS Cpep capsule Commonly known as: Creon Take 2 capsules (72,000 Units total) by  mouth 3 (three) times daily with meals. May also take 1 capsule (36,000 Units total) as needed (with snacks).   metoprolol 200 MG 24 hr tablet Commonly known as: TOPROL-XL Take 200 mg by mouth daily.   nitroGLYCERIN 0.4 MG SL tablet Commonly known as: NITROSTAT Place 0.4 mg under the tongue every 5 (five) minutes as needed for chest pain.   ondansetron 4 MG disintegrating tablet Commonly known as: ZOFRAN-ODT Take 1 tablet (4 mg total) by mouth 2 (two) times daily as needed for nausea or vomiting. What changed: when to take this   pantoprazole 40 MG tablet Commonly known as: Protonix Take 1 tablet (40 mg total) by mouth 2 (two) times daily before a meal.   Simethicone 40 MG/0.6ML Liqd Take 0.6 mLs (40 mg total) by mouth in the morning, at noon, and at bedtime for 7 days. What changed:  when to take this reasons to take this   sucralfate 1 GM/10ML suspension Commonly known as: Carafate Take 10 mLs (1 g total) by mouth See admin instructions. Take 4 times with meals and at bedtime. What changed: additional instructions       Allergies  Allergen Reactions   Robaxin [Methocarbamol] Other (See Comments)    jumpy limbs   Aspirin Other (See Comments)    Unknown reaction   Shellfish-Derived Products Nausea And Vomiting and Rash   Trazodone And Nefazodone Other (See Comments)    Muscle spasms   Adhesive [Tape] Itching   Latex Itching   Toradol [Ketorolac Tromethamine] Other (See Comments)    Has ulcers; cannot have this   Contrast Media [Iodinated Contrast Media] Hives   Reglan [Metoclopramide] Other (See Comments)    Muscle spasms   Salmon [Fish Oil] Nausea And Vomiting and Rash    Follow-up Information     Next week for your GI procedure, and with the pain medicine specialist and your Gastroenterologist as scheduled Follow up.                   The results of significant diagnostics from this hospitalization (including imaging, microbiology, ancillary and  laboratory) are listed below for reference.    Significant Diagnostic Studies: DG Chest 2 View  Result Date: 02/21/2023 CLINICAL DATA:  Chest pain. Right upper quadrant and left upper quadrant abdominal pain. EXAM: CHEST - 2 VIEW COMPARISON:  02/19/2023 FINDINGS: Midline trachea. Normal heart size and mediastinal contours. No pleural effusion or pneumothorax. Mild hyperinflation and interstitial thickening, consistent with the clinical history of smoking. No superimposed consolidation. IMPRESSION: No acute cardiopulmonary disease. Peribronchial thickening which may relate to chronic bronchitis or smoking. Electronically Signed   By: Jeronimo Greaves M.D.   On: 02/21/2023 18:18   DG Chest 2 View  Result Date: 02/19/2023 CLINICAL DATA:  Chest pain. EXAM: CHEST - 2 VIEW COMPARISON:  Chest radiograph dated 02/05/2023. FINDINGS: No focal consolidation, pleural effusion, or pneumothorax. The cardiac silhouette is within normal limits. No acute osseous pathology. IMPRESSION: No active cardiopulmonary disease. Electronically Signed   By: Elgie Collard M.D.   On: 02/19/2023 20:01  MR ABDOMEN MRCP W WO CONTAST  Result Date: 02/09/2023 CLINICAL DATA:  Persistent pancreatitis. EXAM: MRI ABDOMEN WITHOUT AND WITH CONTRAST (INCLUDING MRCP) TECHNIQUE: Multiplanar multisequence MR imaging of the abdomen was performed both before and after the administration of intravenous contrast. Heavily T2-weighted images of the biliary and pancreatic ducts were obtained, and three-dimensional MRCP images were rendered by post processing. CONTRAST:  7.12mL GADAVIST GADOBUTROL 1 MMOL/ML IV SOLN COMPARISON:  CT 01/25/2023 FINDINGS: Lower chest:  Lung bases are clear. Hepatobiliary: No intrahepatic biliary duct dilatation. No early enhancing hepatic lesion. Gallbladder normal. The common bile duct common hepatic duct normal caliber. No choledocholithiasis. Pancreas: Pancreatic duct is normal caliber through the head and midbody. There  is a large cystic collection in the mid body of the pancreas towards the tail measuring 3.6 cm by 3.1 cm (image 17/series 3). This cystic structure is uniformly high signal intensity on T2 weighted imaging and no post-contrast enhancement. There is ductal ectasia and cystic change in the tail the pancreas (image 15/3. The large cyst in the mid pancreas does have inherent intermediate signal intensity on T1 weighted imaging demonstrate consistent with proteinaceous material within cyst. There is no post-contrast enhancement (series 101). Cyst size not changed from comparison CT. No suspicious lesions in the pancreas. There are no fluid collections outside of the pancreas. No fluid in the peritoneal retroperitoneal space. [name] Spleen: Normal spleen. Adrenals/urinary tract: Adrenal glands and kidneys are normal. Stomach/Bowel: Stomach and limited of the small bowel is unremarkable Vascular/Lymphatic: Abdominal aortic normal caliber. No retroperitoneal periportal lymphadenopathy. Musculoskeletal: No aggressive osseous lesion IMPRESSION: 1. 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 No evidence acute pancreatitis. 2. Normal biliary tree. 3. No enhancing lesion within liver. Electronically Signed   By: Genevive Bi M.D.   On: 02/09/2023 09:09   MR 3D Recon At Scanner  Result Date: 02/09/2023 CLINICAL DATA:  Persistent pancreatitis. EXAM: MRI ABDOMEN WITHOUT AND WITH CONTRAST (INCLUDING MRCP) TECHNIQUE: Multiplanar multisequence MR imaging of the abdomen was performed both before and after the administration of intravenous contrast. Heavily T2-weighted images of the biliary and pancreatic ducts were obtained, and three-dimensional MRCP images were rendered by post processing. CONTRAST:  7.71mL GADAVIST GADOBUTROL 1 MMOL/ML IV SOLN COMPARISON:  CT 01/25/2023 FINDINGS: Lower chest:  Lung bases are clear.  Hepatobiliary: No intrahepatic biliary duct dilatation. No early enhancing hepatic lesion. Gallbladder normal. The common bile duct common hepatic duct normal caliber. No choledocholithiasis. Pancreas: Pancreatic duct is normal caliber through the head and midbody. There is a large cystic collection in the mid body of the pancreas towards the tail measuring 3.6 cm by 3.1 cm (image 17/series 3). This cystic structure is uniformly high signal intensity on T2 weighted imaging and no post-contrast enhancement. There is ductal ectasia and cystic change in the tail the pancreas (image 15/3. The large cyst in the mid pancreas does have inherent intermediate signal intensity on T1 weighted imaging demonstrate consistent with proteinaceous material within cyst. There is no post-contrast enhancement (series 101). Cyst size not changed from comparison CT. No suspicious lesions in the pancreas. There are no fluid collections outside of the pancreas. No fluid in the peritoneal retroperitoneal space. [name] Spleen: Normal spleen. Adrenals/urinary tract: Adrenal glands and kidneys are normal. Stomach/Bowel: Stomach and limited of the small bowel is unremarkable Vascular/Lymphatic: Abdominal aortic normal caliber. No retroperitoneal periportal lymphadenopathy. Musculoskeletal: No aggressive osseous lesion IMPRESSION: 1. Moderate size pseudocyst  in the mid body the pancreas with upstream ductal ectasia and cystic change consistent with sequelae of prior pancreatitis. No evidence acute pancreatitis. 2. Normal biliary tree. 3. No enhancing lesion within liver. Electronically Signed   By: Genevive Bi M.D.   On: 02/09/2023 09:09   CT HEAD WO CONTRAST ( )  Result Date: 02/06/2023 CLINICAL DATA:  Delirium, altered mental status.  Hypertensive. EXAM: CT HEAD WITHOUT CONTRAST TECHNIQUE: Contiguous axial images were obtained from the base of the skull through the vertex without intravenous contrast. RADIATION DOSE REDUCTION: This exam  was performed according to the departmental dose-optimization program which includes automated exposure control, adjustment of the mA and/or kV according to patient size and/or use of iterative reconstruction technique. COMPARISON:  11/05/2022. FINDINGS: Brain: No acute intracranial hemorrhage, midline shift or mass effect. No extra-axial fluid collection. Gray-white matter differentiation is within normal limits. No hydrocephalus. Vascular: No hyperdense vessel or unexpected calcification. Skull: No acute fracture. Sinuses/Orbits: No acute abnormality. There is bony deformity of the medial orbital wall on the left with fixation hardware. Other: None. IMPRESSION: No acute intracranial process. Electronically Signed   By: Thornell Sartorius M.D.   On: 02/06/2023 00:46   DG Chest 2 View  Result Date: 02/06/2023 CLINICAL DATA:  Altered mental status EXAM: CHEST - 2 VIEW COMPARISON:  01/24/2023 FINDINGS: The heart size and mediastinal contours are within normal limits. Both lungs are clear. The visualized skeletal structures are unremarkable. IMPRESSION: Normal study. Electronically Signed   By: Charlett Nose M.D.   On: 02/06/2023 00:00   CT ABDOMEN PELVIS WO CONTRAST  Result Date: 01/25/2023 CLINICAL DATA:  Left chest and abdominal pain associated with shortness of breath, nausea, and vomiting EXAM: CT ABDOMEN AND PELVIS WITHOUT CONTRAST TECHNIQUE: Multidetector CT imaging of the abdomen and pelvis was performed following the standard protocol without IV contrast. RADIATION DOSE REDUCTION: This exam was performed according to the departmental dose-optimization program which includes automated exposure control, adjustment of the mA and/or kV according to patient size and/or use of iterative reconstruction technique. COMPARISON:  CT abdomen and pelvis dated 01/09/2023 FINDINGS: Lower chest: No focal consolidation or pulmonary nodule in the lung bases. No pleural effusion or pneumothorax demonstrated. Partially imaged  heart size is normal. Hepatobiliary: No focal hepatic lesions. No intra or extrahepatic biliary ductal dilation. Normal gallbladder. Pancreas: Sequela of chronic pancreatitis with coarse calcifications. 3.8 x 3.5 cm hypoattenuation within the pancreatic body (3:24) and an adjacent smaller hypodensity measuring 0.9 cm (3:25), not substantially changed from 01/09/2023. A cyst at the pancreatic tail measures 1.0 cm (3:24). Spleen: Normal in size without focal abnormality. Adrenals/Urinary Tract: No adrenal nodules. No suspicious renal mass, calculi or hydronephrosis. No focal bladder wall thickening. Stomach/Bowel: Underdistended stomach demonstrates similar mural thickening along the greater curvature. No evidence of bowel wall thickening, distention, or inflammatory changes. Normal appendix. Vascular/Lymphatic: Aortic atherosclerosis. No enlarged abdominal or pelvic lymph nodes. Reproductive: Prostate is unremarkable. Other: No free fluid, fluid collection, or free air. Musculoskeletal: No acute or abnormal lytic or blastic osseous lesions. Degenerative changes at L5-S1. IMPRESSION: 1. No acute abnormality in the abdomen or pelvis. 2. Similar mural thickening along the greater curvature of the stomach, which may be due to underdistention or gastritis. 3. Sequela of chronic pancreatitis with similar hypoattenuating lesions in the pancreatic body and tail, which may represent pseudocysts. 4.  Aortic Atherosclerosis (ICD10-I70.0). Electronically Signed   By: Agustin Cree M.D.   On: 01/25/2023 16:24   DG Abdomen 1 View  Result  Date: 01/25/2023 CLINICAL DATA:  Abdominal pain.  History of ileus. EXAM: ABDOMEN - 1 VIEW COMPARISON:  01/16/2023. FINDINGS: There is increased amount of gas throughout the small bowel and colon, likely manifestation of adynamic ileus. Consider radiographic follow up if clinically indicated. No evidence of pneumoperitoneum, within the limitations of a supine film. No acute osseous abnormalities.  The soft tissues are within normal limits. Surgical changes, devices, tubes and lines: None. IMPRESSION: 1. Increased amount of gas throughout the small bowel and colon, likely manifestation of adynamic ileus. Electronically Signed   By: Jules Schick M.D.   On: 01/25/2023 14:49   DG Chest 2 View  Result Date: 01/24/2023 CLINICAL DATA:  Chest pain and shortness of breath. Nausea and vomiting. EXAM: CHEST - 2 VIEW COMPARISON:  Chest from abdominal series 01/16/2023 FINDINGS: The cardiomediastinal contours are normal. Mild chronic bronchial thickening. Pulmonary vasculature is normal. No consolidation, pleural effusion, or pneumothorax. No acute osseous abnormalities are seen. IMPRESSION: No active cardiopulmonary disease. Electronically Signed   By: Narda Rutherford M.D.   On: 01/24/2023 23:21    Microbiology: No results found for this or any previous visit (from the past 240 hour(s)).   Labs: Basic Metabolic Panel: Recent Labs  Lab 02/19/23 2034 02/21/23 1543 02/22/23 0521  NA 140 138 135  K 4.1 4.5 4.1  CL 105 106 104  CO2 21* 22 19*  GLUCOSE 94 90 105*  BUN 7 12 9   CREATININE 1.06 1.30* 1.07  CALCIUM 8.3* 8.5* 8.5*  MG  --   --  1.2*  PHOS  --   --  3.9   Liver Function Tests: Recent Labs  Lab 02/19/23 2034 02/21/23 2345 02/22/23 0521  AST 46* 35 37  ALT 23 23 22   ALKPHOS 82 76 82  BILITOT 0.7 0.4 0.6  PROT 6.6 6.7 7.3  ALBUMIN 3.5 3.5 3.8   Recent Labs  Lab 02/21/23 1543  LIPASE 24   No results for input(s): "AMMONIA" in the last 168 hours. CBC: Recent Labs  Lab 02/19/23 2034 02/21/23 1543 02/22/23 0521  WBC 7.7 6.9 8.6  NEUTROABS 3.4  --   --   HGB 9.0* 9.8* 9.7*  HCT 28.4* 31.2* 31.8*  MCV 81.8 85.7 84.4  PLT 184 192 187   Cardiac Enzymes: No results for input(s): "CKTOTAL", "CKMB", "CKMBINDEX", "TROPONINI" in the last 168 hours. BNP: BNP (last 3 results) No results for input(s): "BNP" in the last 8760 hours.  ProBNP (last 3 results) No results  for input(s): "PROBNP" in the last 8760 hours.  CBG: No results for input(s): "GLUCAP" in the last 168 hours.     Signed:  Silvano Bilis MD.  [facility] 02/22/2023, 3:28 PM

## 2023-02-22 NOTE — H&P (Addendum)
History and Physical    [name] QMV:[id number] DOB: [birth year] DOA: 02/21/2023  PCP: Default, Provider, MD   Patient coming from: Home   Chief Complaint:  Chief Complaint  Patient presents with   Abdominal Pain   Chest Pain    HPI:  [name] is a 52 y.o. male with hx of chronic alcoholic pancreatitis, with pancreatic pseudocyst, paroxysmal atrial fibrillation not on anticoagulation, Wolff-Parkinson-White syndrome s/p ablation, hypertension, bipolar disorder, GERD, and polysubstance abuse(alcohol, tobacco, cocaine, marijuana), who presents with worsening of his chronic abd pain. Reports that he has been unable to tolerate any significant oral intake over the past 3 days.  Notes that when he eats fatty/greasy food like macaroni and cheese, fried chicken, eggs and grits that this worsens his pain.  Vomited after eggs and grits this morning.  He has chronic worsened pain in the epigastrium/left upper quadrant.  Associated he also has pain that radiates down to his groin.  Reports that when he gets dehydrated this groin pain sometimes appears.  And otherwise he reports difficulty with voiding and straining.  He has run out of his opiate medications.  His chronic pain is typically a 3-5 out of 10, currently rated at a 7.    Review of Systems:  ROS complete and negative except as marked above   Allergies  Allergen Reactions   Robaxin [Methocarbamol] Other (See Comments)    jumpy limbs   Aspirin Other (See Comments)    Unknown reaction   Shellfish-Derived Products Nausea And Vomiting and Rash   Trazodone And Nefazodone Other (See Comments)    Muscle spasms   Adhesive [Tape] Itching   Latex Itching   Toradol [Ketorolac Tromethamine] Other (See Comments)    Has ulcers; cannot have this   Contrast Media [Iodinated Contrast Media] Hives   Reglan [Metoclopramide] Other (See Comments)    Muscle spasms   Salmon [Fish Oil] Nausea And Vomiting and Rash    Prior  to Admission medications   Medication Sig Start Date End Date Taking? Authorizing Provider  famotidine (PEPCID) 20 MG tablet Take 20 mg by mouth 2 (two) times daily.   Yes [provider]  fluticasone (FLONASE) 50 MCG/ACT nasal spray Place 2 sprays into both nostrils 2 (two) times daily as needed for allergies.   Yes [provider]  folic acid (FOLVITE) 1 MG tablet Take 1 tablet (1 mg total) by mouth daily. 09/21/22  Yes Pokhrel, Laxman, MD  lipase/protease/amylase (CREON) 36000 UNITS CPEP capsule Take 2 capsules (72,000 Units total) by mouth 3 (three) times daily with meals. May also take 1 capsule (36,000 Units total) as needed (with snacks). 01/26/23  Yes Jerald Kief, MD  metoprolol (TOPROL-XL) 200 MG 24 hr tablet Take 200 mg by mouth daily. 11/30/22  Yes [provider]  nitroGLYCERIN (NITROSTAT) 0.4 MG SL tablet Place 0.4 mg under the tongue every 5 (five) minutes as needed for chest pain.   Yes [provider]  ondansetron (ZOFRAN-ODT) 4 MG disintegrating tablet Take 1 tablet (4 mg total) by mouth 2 (two) times daily as needed for nausea or vomiting. Patient taking differently: Take 4 mg by mouth as needed for nausea or vomiting. 01/26/23  Yes Jerald Kief, MD  pantoprazole (PROTONIX) 40 MG tablet Take 1 tablet (40 mg total) by mouth 2 (two) times daily before a meal. 02/11/23 03/13/23 Yes Dahal, Melina Schools, MD  Simethicone 40 MG/0.6ML LIQD Take 0.6 mLs (40 mg total) by mouth in the morning, at  noon, and at bedtime for 7 days. Patient taking differently: Take 40 mg by mouth 3 (three) times daily as needed (for bloating and gas). 02/11/23 02/21/24 Yes Dahal, Melina Schools, MD  sucralfate (CARAFATE) 1 GM/10ML suspension Take 10 mLs (1 g total) by mouth See admin instructions. Take 4 times with meals and at bedtime. Patient taking differently: Take 1 g by mouth See admin instructions. Take 1g by mouth three times a day with meals.  May take an additional 1g with a snack.  01/11/23  Yes Jerald Kief, MD  amitriptyline (ELAVIL) 25 MG tablet Take 1 tablet (25 mg total) by mouth at bedtime. Patient not taking: Reported on 08/08/2019 10/15/18 08/08/19  Rolly Salter, MD    Past Medical History:  Diagnosis Date   Alcoholism /alcohol abuse    Anemia    Anxiety    Arthritis    knees; arms; elbows (03/26/2015)   Asthma    Bipolar disorder (HCC)    Chronic bronchitis (HCC)    Chronic lower back pain    Chronic pancreatitis (HCC)    Cocaine abuse (HCC)    Depression    Family history of adverse reaction to anesthesia    Femoral condyle fracture (HCC) 03/08/2014   left medial/notes 03/09/2014   GERD (gastroesophageal reflux disease)    H/O hiatal hernia    H/O suicide attempt 10/2012   High cholesterol    History of blood transfusion 10/2012   when I tried to commit suicide   History of stomach ulcers    Hypertension    Marijuana abuse, continuous    Migraine    a few times/year (03/26/2015)   Pancreatitis    Pneumonia 1990's X 3   PTSD (post-traumatic stress disorder)    Seizures (HCC)    Sickle cell trait (HCC)    WPW (Wolff-Parkinson-White syndrome)    [name] 03/06/2013    Past Surgical History:  Procedure Laterality Date   BIOPSY  11/25/2017   Procedure: BIOPSY;  Surgeon: [name] Modena, MD;  Location: [facility] ENDOSCOPY;  Service: Endoscopy;;   BIOPSY  10/14/2018   Procedure: BIOPSY;  Surgeon: [name] Modena, MD;  Location: [facility] ENDOSCOPY;  Service: Endoscopy;;   CARDIAC CATHETERIZATION     CYST ENTEROSTOMY  01/02/2020   Procedure: CYST ASPIRATION;  Surgeon: Rachael Fee, MD;  Location: [facility] ENDOSCOPY;  Service: Endoscopy;;   ESOPHAGOGASTRODUODENOSCOPY (EGD) WITH PROPOFOL N/A 11/25/2017   Procedure: ESOPHAGOGASTRODUODENOSCOPY (EGD) WITH PROPOFOL;  Surgeon: [name] Modena, MD;  Location: [facility] ENDOSCOPY;  Service: Endoscopy;  Laterality: N/A;   ESOPHAGOGASTRODUODENOSCOPY (EGD) WITH PROPOFOL Left 10/14/2018   Procedure: ESOPHAGOGASTRODUODENOSCOPY (EGD)  WITH PROPOFOL;  Surgeon: [name] Modena, MD;  Location: [facility] END[facility]COPY;  Service: Endoscopy;  Laterality: Left;   ESOPHAGOGASTRODUODENOSCOPY (EGD) WITH PROPOFOL N/A 11/14/2018   Procedure: ESOPHAGOGASTRODUODENOSCOPY (EGD) WITH PROPOFOL;  Surgeon: Carman Ching, MD;  Location: [facility] ENDOSCOPY;  Service: Gastroenterology;  Laterality: N/A;   ESOPHAGOGASTRODUODENOSCOPY (EGD) WITH PROPOFOL N/A 01/02/2020   Procedure: ESOPHAGOGASTRODUODENOSCOPY (EGD) WITH PROPOFOL;  Surgeon: Rachael Fee, MD;  Location: [facility] ENDOSCOPY;  Service: Endoscopy;  Laterality: N/A;   ESOPHAGOGASTRODUODENOSCOPY (EGD) WITH PROPOFOL N/A 10/25/2020   Procedure: ESOPHAGOGASTRODUODENOSCOPY (EGD) WITH PROPOFOL;  Surgeon: Meridee Score Netty Starring., MD;  Location: [facility] ENDOSCOPY;  Service: Gastroenterology;  Laterality: N/A;   EUS N/A 01/02/2020   Procedure: UPPER ENDOSCOPIC ULTRASOUND (EUS) RADIAL;  Surgeon: Rachael Fee, MD;  Location: [facility] ENDOSCOPY;  Service: Endoscopy;  Laterality: N/A;   EYE SURGERY Left 1990's   "result of trauma"  FACIAL FRACTURE SURGERY Left 1990's   "result of trauma"    FLEXIBLE SIGMOIDOSCOPY N/A 10/25/2020   Procedure: FLEXIBLE SIGMOIDOSCOPY;  Surgeon: Meridee Score Netty Starring., MD;  Location: [facility] ENDOSCOPY;  Service: Gastroenterology;  Laterality: N/A;   FRACTURE SURGERY     HEMOSTASIS CLIP PLACEMENT  10/25/2020   Procedure: HEMOSTASIS CLIP PLACEMENT;  Surgeon: Lemar Lofty., MD;  Location: [facility] ENDOSCOPY;  Service: Gastroenterology;;   HERNIA REPAIR     LEFT HEART CATHETERIZATION WITH CORONARY ANGIOGRAM Right 03/07/2013   Procedure: LEFT HEART CATHETERIZATION WITH CORONARY ANGIOGRAM;  Surgeon: Ricki Rodriguez, MD;  Location: [facility] CATH LAB;  Service: Cardiovascular;  Laterality: Right;   UMBILICAL HERNIA REPAIR     UPPER GASTROINTESTINAL ENDOSCOPY       reports that he has been smoking cigarettes and e-cigarettes. He has a 36 pack-year smoking history. He has never used smokeless tobacco. He reports that  he does not currently use alcohol after a past usage of about 4.0 standard drinks of alcohol per week. He reports that he does not currently use drugs after having used the following drugs: Marijuana and Cocaine.  Family History  Problem Relation Age of Onset   Hypertension Mother    Cirrhosis Father    Alcoholism Father    Hypertension Father    Melanoma Father    Hypertension Other    Coronary artery disease Other      Physical Exam: Vitals:   02/21/23 2230 02/21/23 2341 02/21/23 2347 02/22/23 0027  BP: (!) 167/100  (!) 155/100 (!) 157/96  Pulse: 60  80 85  Resp: 17  20 18   Temp:  97.8 F (36.6 C)  97.6 F (36.4 C)  TempSrc:  Oral  Oral  SpO2: 100%  100% 100%  Weight:      Height:        Gen: Awake, alert, NAD, chronically ill-appearing CV: Regular, normal S1, S2, no murmurs  Resp: Normal WOB, CTAB  Abd: Flat, hypoactive, mild to moderate tenderness in the epigastrium.  Does not appear to have any hernia in the groin.  MSK: Symmetric, no edema  Skin: No rashes or lesions to exposed skin  Neuro: Alert and interactive  Psych: Insight is limited   Data review:   Labs reviewed, notable for:   Lipase is 24 LFTs within normal limit Serum alcohol negative Urine drug screen positive for amphetamine and cocaine Creatinine 1.3, up from baseline approximately 0.6  Micro:  Results for orders placed or performed during the hospital encounter of 07/19/22  Resp panel by RT-PCR (RSV, Flu A&B, Covid) Anterior Nasal Swab     Status: None   Collection Time: 07/19/22  8:18 PM   Specimen: Anterior Nasal Swab  Result Value Ref Range Status   SARS Coronavirus 2 by RT PCR NEGATIVE NEGATIVE Final   Influenza A by PCR NEGATIVE NEGATIVE Final   Influenza B by PCR NEGATIVE NEGATIVE Final    Comment: (NOTE) The Xpert Xpress SARS-CoV-2/FLU/RSV plus assay is intended as an aid in the diagnosis of influenza from Nasopharyngeal swab specimens and should not be used as a sole basis for  treatment. Nasal washings and aspirates are unacceptable for Xpert Xpress SARS-CoV-2/FLU/RSV testing.  Fact Sheet for Patients: BloggerCourse.com  Fact Sheet for Healthcare Providers: SeriousBroker.it  This test is not yet approved or cleared by the [place] FDA and has been authorized for detection and/or diagnosis of SARS-CoV-2 by FDA under an Emergency Use Authorization (EUA). This EUA will remain in effect (meaning this  test can be used) for the duration of the COVID-19 declaration under Section 564(b)(1) of the Act, 21 U.S.C. section 360bbb-3(b)(1), unless the authorization is terminated or revoked.     Resp Syncytial Virus by PCR NEGATIVE NEGATIVE Final    Comment: (NOTE) Fact Sheet for Patients: BloggerCourse.com  Fact Sheet for Healthcare Providers: SeriousBroker.it  This test is not yet approved or cleared by the [place] FDA and has been authorized for detection and/or diagnosis of SARS-CoV-2 by FDA under an Emergency Use Authorization (EUA). This EUA will remain in effect (meaning this test can be used) for the duration of the COVID-19 declaration under Section 564(b)(1) of the Act, 21 U.S.C. section 360bbb-3(b)(1), unless the authorization is terminated or revoked.  Performed at [facility], [address] [place], [place] [address]     Imaging reviewed:  DG Chest 2 View  Result Date: 02/21/2023 CLINICAL DATA:  Chest pain. Right upper quadrant and left upper quadrant abdominal pain. EXAM: CHEST - 2 VIEW COMPARISON:  02/19/2023 FINDINGS: Midline trachea. Normal heart size and mediastinal contours. No pleural effusion or pneumothorax. Mild hyperinflation and interstitial thickening, consistent with the clinical history of smoking. No superimposed consolidation. IMPRESSION: No acute cardiopulmonary disease. Peribronchial thickening which may relate  to chronic bronchitis or smoking. Electronically Signed   By: Jeronimo Greaves M.D.   On: 02/21/2023 18:18    EKG:  Sinus rhythm, right axis deviation, right atrial enlargement, short PR, PVC, T wave inversions anterolaterally; when compared to previous T wave inversion is more diffuse  ED Course:  Treated with 1 L NS, droperidol, promethazine, Zofran, Dilaudid   Assessment/Plan:  52 y.o. male with hx chronic alcoholic pancreatitis, with pancreatic pseudocyst, paroxysmal atrial fibrillation not on anticoagulation, Wolff-Parkinson-White syndrome s/p ablation, hypertension, bipolar disorder, GERD, and polysubstance abuse(alcohol, tobacco, cocaine, marijuana), who presents with acute on chronic pancreatitis   Acute on chronic pancreatitis Hx pancreatic pseudocyst  Patient with a history of chronic alcoholic pancreatitis.  He continues to drink, last drink was 1 week ago per his report.  However his report about other substance use is not accurate.  Concerned that he may still have some alcohol use.  Today his alcohol level is negative.  LFTs are normal.  No imaging obtained this admission, has recent history of MRCP on 10/2 demonstrating Moderate size pseudocyst in the mid body the pancreas with upstream ductal ectasia and cystic change apart from his chronic pancreatitis he appears to have drug-seeking behaviors.  When discussing upcoming treatments for his pancreatitis he asks "if I get a nerve block, do they still prescribe opiates?"  He is concentrated on opiate therapy for his chronic pancreatitis, and we discussed that he is a poor candidate for chronic opioid therapy considering overall risks of opiates, and his polysubstance use which is active.  Currently he is opiate dependent as discussed further below.  He has been trialed on duloxetine, gabapentin and most recently Lyrica but has no fill history in the past 2 months.  With his history of WPW despite prior ablation and may be good to avoid TCAs.   He has planned upcoming EUS, celiac nerve block, and will be establishing care with a primary care physician.  - GI consult in AM, messaged Dr. Rhea Belton of [facility] overnight. appreciate input on medication therapy as well as any utility in expedited EUS (although is scheduled 10/24) - Status post 1 L NS, continue oral hydration - Restart Lyrica at 75 mg twice daily - Symptomatic management:  Tylenol 1 g every 6 hours scheduled, Lyrica per above, oxycodone 2.5 mg every 6 hour as needed for severe. Avoid escalation in opiates.  - Due to his polysubstance use and more chronic nature of pain we will not be providing IV opiate therapy - Continue home Creon  Acute kidney injury stage 2  Creatinine 1.3, up from baseline approximately 0.6, although has fluctuated. Suspect prerenal with decreased intake, N/V - IVF per above, continue oral hydration  - Check PVR with hx straining   Question opiate dependence He has received short-term oxycodone prescriptions with his more recent hospitalizations.  With his polysubstance use I am not sure if he is using any illicit opiates but I suspect this could be the case.  He had been on Suboxone in the past but reports he did not tolerate due to stomach issues -Send comprehensive urine drug screen and urine fentanyl -Will try to minimize opiates as much as possible due to his hx of substance use disorder and rely on alternative / neuropathic agents for pancreatitis.   Active polysubstance abuse He reports recent alcohol use 1 week ago.  When asked about other substances he denies other than cocaine use, which she did not specify when his last use was.  His urine drug screen is positive for amphetamines and cocaine.  -See discussion above, limit opiate therapy -He should see an addiction medicine specialist outpatient  Current smoking  1/2 PPD smoker, counseled on smoking cessation  - Nicotine 7 mg patch   Abnormal EKG -Check troponin  Chronic medical  problems: A-fib, paroxysmal not on anticoagulation: Hold his home metoprolol since he likely has recent cocaine use.  I have counseled him on the risk of beta-blocker and cocaine use GERD: Continue home pantoprazole, famotidine, Carafate Hypertension: See beta-blocker above Bipolar disorder: Outpatient psych follow-up  Body mass index is 18.4 kg/m.    DVT prophylaxis:  Lovenox Code Status:  Full Code Diet:  Diet Orders (From admission, onward)     Start     Ordered   02/21/23 2332  Diet clear liquid Room service appropriate? Yes; Fluid consistency: Thin  Diet effective now       Question Answer Comment  Room service appropriate? Yes   Fluid consistency: Thin      02/21/23 2334           Family Communication:  No   Consults:  GI - [place]  Admission status:   Inpatient, Med-Surg  Severity of Illness: The appropriate patient status for this patient is INPATIENT. Inpatient status is judged to be reasonable and necessary in order to provide the required intensity of service to ensure the patient's safety. The patient's presenting symptoms, physical exam findings, and initial radiographic and laboratory data in the context of their chronic comorbidities is felt to place them at high risk for further clinical deterioration. Furthermore, it is not anticipated that the patient will be medically stable for discharge from the hospital within 2 midnights of admission.   * I certify that at the point of admission it is my clinical judgment that the patient will require inpatient hospital care spanning beyond 2 midnights from the point of admission due to high intensity of service, high risk for further deterioration and high frequency of surveillance required.*   Dolly Rias, MD [facility]  How to contact the [facility] Attending or Consulting provider 7A - 7P or covering provider during after hours 7P -7A, for this patient.  Check the care team in [facility] and  look for a)  attending/consulting [facility] provider listed and b) the [facility] team listed Log into [email or web address] and use [place]'s universal password to access. If you do not have the password, please contact the hospital operator. Locate the [facility] provider you are looking for under [facility] and page to a number that you can be directly reached. If you still have difficulty reaching the provider, please page the [facility] (Director on Call) for the Hospitalists listed on amion for assistance.  02/22/2023, 2:00 AM

## 2023-02-22 NOTE — Plan of Care (Signed)

## 2023-02-22 NOTE — Consult Note (Addendum)
Attending physician's note   I have taken a history, reviewed the chart, and examined the patient. I performed a substantive portion of this encounter, including complete performance of at least one of the key components, in conjunction with the APP. I agree with the APP's note, impression, and recommendations with my edits.   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  When I saw him this morning, reports his abdominal pain is more or less same as it has been, but perhaps some increase in pelvic and hip pain.  Does admit to using cocaine, THC, and EtOH 2 days ago, and continues smoking.  Admission evaluation notable for the following: - WBC 6.9, H/H 9.8/31.2 (at baseline) - BUN/creatinine 12/1.3--> 9/1.07 (back to baseline) - Normal liver enzymes, INR - Negative/normal troponin, lipase - UDS: + For cocaine and amphetamines.  Interestingly negative for opiates despite telling me he last took prescribed opiates 4 days ago.  Extended UDS for other agents pending  1) Chronic pancreatitis 2) EtOH use disorder 3) Polysubstance abuse 4) Drug-seeking behavior - Scheduled for EUS with FNA and possible celiac block next week.  Strongly encouraged him to keep that appointment as scheduled - IV fluids for now but can advance diet as tolerated - Strongly counseled on complete cessation of all EtOH and drug use - Strongly counseled on tobacco cessation - No role for inpatient endoscopy for these chronic findings - Pain control as inpatient per primary Hospital service - Needs to get plugged in with outpatient Pain Management clinic - Continue Creon - Continue Lyrica  Inpatient GI service will remain available as needed.  Please do not hesitate to contact us with additional questions or concerns  Doristine Locks, DO, FACG [phone] office          Referring Provider: Dr. Lazarus Salines Primary Care Physician:  Default, Provider, MD Primary Gastroenterologist:  Dr. Adela Lank  Reason for Consultation:  chronic pancreatitis and pseudocyst  HPI: [name] is a 52 y.o. male with PMH of chronic alcoholic pancreatitis with pancreatic pseudocyst, paroxysmal atrial fibrillation not on anticoagulation, Wolff-Parkinson-White syndrome s/p ablation, hypertension, bipolar disorder, GERD, and polysubstance abuse (alcohol, tobacco, cocaine, marijuana, amphetamines) who presents with worsening of his chronic abd pain.  No significant lab abnormalities.  Is scheduled for an EUS with celiac block and FNA with Dr. Meridee Score next week.  MRI abdomen/MRCP 02/09/2023:  IMPRESSION: 1. 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 No evidence acute pancreatitis. 2. Normal biliary tree. 3. No enhancing lesion within liver.  Asked for pain medication on multiple occasions.  Reports pain in his hips.  Urinary symptoms.  Last cocaine use on Monday.  Past Medical History:  Diagnosis Date   Alcoholism /alcohol abuse    Anemia    Anxiety    Arthritis    knees; arms; elbows (03/26/2015)   Asthma    Bipolar disorder (HCC)    Chronic bronchitis (HCC)    Chronic lower back pain    Chronic pancreatitis (HCC)    Cocaine abuse (HCC)    Depression    Family history of adverse reaction to anesthesia    Femoral condyle fracture (HCC) 03/08/2014   left medial/notes 03/09/2014   GERD (gastroesophageal reflux disease)    H/O hiatal hernia  H/O suicide attempt 10/2012   High cholesterol    History of blood transfusion 10/2012   when I tried to commit suicide   History of stomach ulcers    Hypertension    Marijuana abuse, continuous    Migraine    a few times/year (03/26/2015)   Pancreatitis     Pneumonia 1990's X 3   PTSD (post-traumatic stress disorder)    Seizures (HCC)    Sickle cell trait (HCC)    WPW (Wolff-Parkinson-White syndrome)    [name] 03/06/2013    Past Surgical History:  Procedure Laterality Date   BIOPSY  11/25/2017   Procedure: BIOPSY;  Surgeon: [name] Modena, MD;  Location: [facility] ENDOSCOPY;  Service: Endoscopy;;   BIOPSY  10/14/2018   Procedure: BIOPSY;  Surgeon: [name] Modena, MD;  Location: [facility] ENDOSCOPY;  Service: Endoscopy;;   CARDIAC CATHETERIZATION     CYST ENTEROSTOMY  01/02/2020   Procedure: CYST ASPIRATION;  Surgeon: Rachael Fee, MD;  Location: [facility] ENDOSCOPY;  Service: Endoscopy;;   ESOPHAGOGASTRODUODENOSCOPY (EGD) WITH PROPOFOL N/A 11/25/2017   Procedure: ESOPHAGOGASTRODUODENOSCOPY (EGD) WITH PROPOFOL;  Surgeon: [name] Modena, MD;  Location: [facility] ENDOSCOPY;  Service: Endoscopy;  Laterality: N/A;   ESOPHAGOGASTRODUODENOSCOPY (EGD) WITH PROPOFOL Left 10/14/2018   Procedure: ESOPHAGOGASTRODUODENOSCOPY (EGD) WITH PROPOFOL;  Surgeon: [name] Modena, MD;  Location: [facility] ENDOSCOPY;  Service: Endoscopy;  Laterality: Left;   ESOPHAGOGASTRODUODENOSCOPY (EGD) WITH PROPOFOL N/A 11/14/2018   Procedure: ESOPHAGOGASTRODUODENOSCOPY (EGD) WITH PROPOFOL;  Surgeon: Carman Ching, MD;  Location: [facility] ENDOSCOPY;  Service: Gastroenterology;  Laterality: N/A;   ESOPHAGOGASTRODUODENOSCOPY (EGD) WITH PROPOFOL N/A 01/02/2020   Procedure: ESOPHAGOGASTRODUODENOSCOPY (EGD) WITH PROPOFOL;  Surgeon: Rachael Fee, MD;  Location: [facility] ENDOSCOPY;  Service: Endoscopy;  Laterality: N/A;   ESOPHAGOGASTRODUODENOSCOPY (EGD) WITH PROPOFOL N/A 10/25/2020   Procedure: ESOPHAGOGASTRODUODENOSCOPY (EGD) WITH PROPOFOL;  Surgeon: Meridee Score Netty Starring., MD;  Location: [facility] ENDOSCOPY;  Service: Gastroenterology;  Laterality: N/A;   EUS N/A 01/02/2020   Procedure: UPPER ENDOSCOPIC ULTRASOUND (EUS) RADIAL;  Surgeon: Rachael Fee, MD;  Location: [facility] ENDOSCOPY;  Service: Endoscopy;  Laterality: N/A;   EYE  SURGERY Left 1990's   "result of trauma"    FACIAL FRACTURE SURGERY Left 1990's   "result of trauma"    FLEXIBLE SIGMOIDOSCOPY N/A 10/25/2020   Procedure: FLEXIBLE SIGMOIDOSCOPY;  Surgeon: Lemar Lofty., MD;  Location: [facility] ENDOSCOPY;  Service: Gastroenterology;  Laterality: N/A;   FRACTURE SURGERY     HEMOSTASIS CLIP PLACEMENT  10/25/2020   Procedure: HEMOSTASIS CLIP PLACEMENT;  Surgeon: Lemar Lofty., MD;  Location: [facility] ENDOSCOPY;  Service: Gastroenterology;;   HERNIA REPAIR     LEFT HEART CATHETERIZATION WITH CORONARY ANGIOGRAM Right 03/07/2013   Procedure: LEFT HEART CATHETERIZATION WITH CORONARY ANGIOGRAM;  Surgeon: Ricki Rodriguez, MD;  Location: [facility] CATH LAB;  Service: Cardiovascular;  Laterality: Right;   UMBILICAL HERNIA REPAIR     UPPER GASTROINTESTINAL ENDOSCOPY      Prior to Admission medications   Medication Sig Start Date End Date Taking? Authorizing Provider  famotidine (PEPCID) 20 MG tablet Take 20 mg by mouth 2 (two) times daily.   Yes [provider]  fluticasone (FLONASE) 50 MCG/ACT nasal spray Place 2 sprays into both nostrils 2 (two) times daily as needed for allergies.   Yes [provider]  folic acid (FOLVITE) 1 MG tablet Take 1 tablet (1 mg total) by mouth daily. 09/21/22  Yes Pokhrel, Laxman, MD  lipase/protease/amylase (CREON) 36000 UNITS CPEP capsule Take 2 capsules (72,000 Units total) by mouth 3 (three)  times daily with meals. May also take 1 capsule (36,000 Units total) as needed (with snacks). 01/26/23  Yes Jerald Kief, MD  metoprolol (TOPROL-XL) 200 MG 24 hr tablet Take 200 mg by mouth daily. 11/30/22  Yes [provider]  nitroGLYCERIN (NITROSTAT) 0.4 MG SL tablet Place 0.4 mg under the tongue every 5 (five) minutes as needed for chest pain.   Yes [provider]  ondansetron (ZOFRAN-ODT) 4 MG disintegrating tablet Take 1 tablet (4 mg total) by mouth 2 (two) times daily as needed for nausea or  vomiting. Patient taking differently: Take 4 mg by mouth as needed for nausea or vomiting. 01/26/23  Yes Jerald Kief, MD  pantoprazole (PROTONIX) 40 MG tablet Take 1 tablet (40 mg total) by mouth 2 (two) times daily before a meal. 02/11/23 03/13/23 Yes Dahal, Melina Schools, MD  Simethicone 40 MG/0.6ML LIQD Take 0.6 mLs (40 mg total) by mouth in the morning, at noon, and at bedtime for 7 days. Patient taking differently: Take 40 mg by mouth 3 (three) times daily as needed (for bloating and gas). 02/11/23 02/21/24 Yes Dahal, Melina Schools, MD  sucralfate (CARAFATE) 1 GM/10ML suspension Take 10 mLs (1 g total) by mouth See admin instructions. Take 4 times with meals and at bedtime. Patient taking differently: Take 1 g by mouth See admin instructions. Take 1g by mouth three times a day with meals.  May take an additional 1g with a snack. 01/11/23  Yes Jerald Kief, MD  amitriptyline (ELAVIL) 25 MG tablet Take 1 tablet (25 mg total) by mouth at bedtime. Patient not taking: Reported on 08/08/2019 10/15/18 08/08/19  Rolly Salter, MD    Current Facility-Administered Medications  Medication Dose Route Frequency Provider Last Rate Last Admin   acetaminophen (TYLENOL) tablet 1,000 mg  1,000 mg Oral Q6H Segars, Christiane Ha, MD   1,000 mg at 02/22/23 0812   alum & mag hydroxide-simeth (MAALOX/MYLANTA) 200-200-20 MG/5ML suspension 30 mL  30 mL Oral Q4H PRN Dolly Rias, MD   30 mL at 02/22/23 0354   enoxaparin (LOVENOX) injection 40 mg  40 mg Subcutaneous Q24H Dolly Rias, MD   40 mg at 02/22/23 1610   famotidine (PEPCID) tablet 20 mg  20 mg Oral BID Dolly Rias, MD   20 mg at 02/22/23 9604   lipase/protease/amylase (CREON) capsule 72,000 Units  72,000 Units Oral TID WC Dolly Rias, MD   72,000 Units at 02/22/23 5409   And   lipase/protease/amylase (CREON) capsule 36,000 Units  36,000 Units Oral PRN Dolly Rias, MD       magnesium sulfate IVPB 4 g 100 mL  4 g Intravenous Once Wouk, Wilfred Curtis, MD        nicotine (NICODERM CQ - dosed in mg/24 hr) patch 7 mg  7 mg Transdermal Daily Segars, Christiane Ha, MD   7 mg at 02/22/23 0821   ondansetron (ZOFRAN) injection 4 mg  4 mg Intravenous Q6H PRN Dolly Rias, MD   4 mg at 02/22/23 8119   oxyCODONE (Oxy IR/ROXICODONE) immediate release tablet 2.5 mg  2.5 mg Oral Q6H PRN Dolly Rias, MD   2.5 mg at 02/22/23 0530   pantoprazole (PROTONIX) EC tablet 40 mg  40 mg Oral BID AC Segars, Christiane Ha, MD   40 mg at 02/22/23 0530   pregabalin (LYRICA) capsule 75 mg  75 mg Oral BID Dolly Rias, MD   75 mg at 02/22/23 0812   simethicone (MYLICON) 40 MG/0.6ML suspension 40 mg  40 mg Oral TID PRN  Dolly Rias, MD       sucralfate (CARAFATE) 1 GM/10ML suspension 1 g  1 g Oral TID WC Dolly Rias, MD   1 g at 02/22/23 4696   And   sucralfate (CARAFATE) 1 GM/10ML suspension 1 g  1 g Oral PRN Dolly Rias, MD        Allergies as of 02/21/2023 - Review Complete 02/21/2023  Allergen Reaction Noted   Robaxin [methocarbamol] Other (See Comments) 07/20/2016   Aspirin Other (See Comments) 07/31/2018   Shellfish-derived products Nausea And Vomiting and Rash 08/18/2010   Trazodone and nefazodone Other (See Comments) 09/28/2011   Adhesive [tape] Itching 03/17/2018   Latex Itching 03/17/2018   Toradol [ketorolac tromethamine] Other (See Comments) 03/17/2018   Contrast media [iodinated contrast media] Hives 12/04/2016   Reglan [metoclopramide] Other (See Comments) 11/18/2015   Rosanne Ashing oil] Nausea And Vomiting and Rash 08/15/2021    Family History  Problem Relation Age of Onset   Hypertension Mother    Cirrhosis Father    Alcoholism Father    Hypertension Father    Melanoma Father    Hypertension Other    Coronary artery disease Other     Social History   Socioeconomic History   Marital status: Single    Spouse name: Not on file   Number of children: Not on file   Years of education: Not on file   Highest education level: Not on  file  Occupational History   Occupation: disabled  Tobacco Use   Smoking status: Every Day    Current packs/day: 1.00    Average packs/day: 1 pack/day for 36.0 years (36.0 ttl pk-yrs)    Types: Cigarettes, E-cigarettes   Smokeless tobacco: Never  Vaping Use   Vaping status: Former  Substance and Sexual Activity   Alcohol use: Not Currently    Alcohol/week: 4.0 standard drinks of alcohol    Types: 4 Cans of beer per week   Drug use: Not Currently    Types: Marijuana, Cocaine   Sexual activity: Yes    Birth control/protection: None  Other Topics Concern   Not on file  Social History Narrative   ** Merged History Encounter **       Social Determinants of Health   Financial Resource Strain: Not on file  Food Insecurity: Patient Declined (02/22/2023)   Hunger Vital Sign    Worried About Radiation protection practitioner of Food in the Last Year: Patient declined    Barista in the Last Year: Patient declined  Transportation Needs: Patient Declined (02/22/2023)   PRAPARE - Administrator, Civil Service (Medical): Patient declined    Lack of Transportation (Non-Medical): Patient declined  Physical Activity: Not on file  Stress: Not on file  Social Connections: Not on file  Intimate Partner Violence: Patient Declined (02/22/2023)   Humiliation, Afraid, Rape, and Kick questionnaire    Fear of Current or Ex-Partner: Patient declined    Emotionally Abused: Patient declined    Physically Abused: Patient declined    Sexually Abused: Patient declined    Review of Systems: ROS is O/W negative except as mentioned in HPI.  Physical Exam: Vital signs in last 24 hours: Temp:  [97.6 F (36.4 C)-98.2 F (36.8 C)] 97.7 F (36.5 C) (10/16 0739) Pulse Rate:  [60-85] 67 (10/16 0739) Resp:  [13-20] 18 (10/16 0739) BP: (130-221)/(85-130) 170/95 (10/16 0739) SpO2:  [100 %] 100 % (10/16 0739) Weight:  [54.9 kg] 54.9 kg (10/15 1535) Last BM Date :  02/21/23 General:  Alert, in NAD. Head:   Normocephalic and atraumatic. Eyes:  Sclera clear, no icterus.  Conjunctiva pink. Ears:  Normal auditory acuity. Mouth:  No deformity or lesions.   Lungs:  Clear throughout to auscultation.  No wheezes, crackles, or rhonchi.  Heart:  Regular rate and rhythm; no murmurs, clicks, rubs, or gallops. Abdomen:  Soft, non-distended.  BS present.  Diffuse TTP > in the epigastrium. Msk:  Symmetrical without gross deformities. Pulses:  Normal pulses noted. Extremities:  Without clubbing or edema. Neurologic:  Alert and oriented x 4;  grossly normal neurologically. Skin:  Intact without significant lesions or rashes.  Intake/Output from previous day: 10/15 0701 - 10/16 0700 In: 1280 [P.O.:1280] Out: 854 [Urine:854] Intake/Output this shift: Total I/O In: 118 [P.O.:118] Out: 200 [Urine:200]  Lab Results: Recent Labs    02/19/23 2034 02/21/23 1543 02/22/23 0521  WBC 7.7 6.9 8.6  HGB 9.0* 9.8* 9.7*  HCT 28.4* 31.2* 31.8*  PLT 184 192 187   BMET Recent Labs    02/19/23 2034 02/21/23 1543 02/22/23 0521  NA 140 138 135  K 4.1 4.5 4.1  CL 105 106 104  CO2 21* 22 19*  GLUCOSE 94 90 105*  BUN 7 12 9   CREATININE 1.06 1.30* 1.07  CALCIUM 8.3* 8.5* 8.5*   LFT Recent Labs    02/22/23 0521  PROT 7.3  ALBUMIN 3.8  AST 37  ALT 22  ALKPHOS 82  BILITOT 0.6  BILIDIR 0.1  IBILI 0.5   PT/INR Recent Labs    02/22/23 0521  LABPROT 13.1  INR 1.0   Studies/Results: DG Chest 2 View  Result Date: 02/21/2023 CLINICAL DATA:  Chest pain. Right upper quadrant and left upper quadrant abdominal pain. EXAM: CHEST - 2 VIEW COMPARISON:  02/19/2023 FINDINGS: Midline trachea. Normal heart size and mediastinal contours. No pleural effusion or pneumothorax. Mild hyperinflation and interstitial thickening, consistent with the clinical history of smoking. No superimposed consolidation. IMPRESSION: No acute cardiopulmonary disease. Peribronchial thickening which may relate to chronic bronchitis or  smoking. Electronically Signed   By: Jeronimo Greaves M.D.   On: 02/21/2023 18:18    IMPRESSION:  *52 year old male with alcohol induced chronic pancreatitis in the setting of continuing alcohol use and urine drug screen on this occasion positive for cocaine and amphetamines.  Has a moderate pseudocyst in the mid body of the pancreas.  Is scheduled for EUS with celiac block and FNA next week.  No acute changes or abnormalities in his labs.  PLAN: *Scheduled for outpatient EUS on 10/24 with Dr. Meridee Score.  Will proceed with procedure at that time as no sooner availability for this nonurgent procedure as an inpatient. *Continue supportive care with IV hydration, etc. but all of his symptoms are chronic and suspect that he can be discharged soon. *Needs to completely discontinue drug and alcohol use.  Princella Pellegrini. Zehr  02/22/2023, 10:51 AM

## 2023-02-23 ENCOUNTER — Encounter (HOSPITAL_COMMUNITY): Payer: Self-pay | Admitting: Emergency Medicine

## 2023-02-23 ENCOUNTER — Emergency Department (HOSPITAL_COMMUNITY): Payer: MEDICAID

## 2023-02-23 ENCOUNTER — Emergency Department (HOSPITAL_COMMUNITY)
Admission: EM | Admit: 2023-02-23 | Discharge: 2023-02-23 | Disposition: A | Discharge status: Home / Self Care | Payer: MEDICAID | Attending: Emergency Medicine | Admitting: Emergency Medicine

## 2023-02-23 DIAGNOSIS — Z91040 Latex allergy status: Secondary | ICD-10-CM | POA: Diagnosis not present

## 2023-02-23 DIAGNOSIS — K860 Alcohol-induced chronic pancreatitis: Secondary | ICD-10-CM | POA: Diagnosis not present

## 2023-02-23 DIAGNOSIS — R079 Chest pain, unspecified: Secondary | ICD-10-CM | POA: Diagnosis present

## 2023-02-23 LAB — COMPREHENSIVE METABOLIC PANEL
ALT: 24 U/L (ref 0–44)
AST: 37 U/L (ref 15–41)
Albumin: 3.9 g/dL (ref 3.5–5.0)
Alkaline Phosphatase: 82 U/L (ref 38–126)
Anion gap: 12 (ref 5–15)
BUN: 10 mg/dL (ref 6–20)
CO2: 18 mmol/L — ABNORMAL LOW (ref 22–32)
Calcium: 9.2 mg/dL (ref 8.9–10.3)
Chloride: 106 mmol/L (ref 98–111)
Creatinine, Ser: 1.08 mg/dL (ref 0.61–1.24)
GFR, Estimated: >60 mL/min (ref >60)
Glucose, Bld: 102 mg/dL — ABNORMAL HIGH (ref 70–99)
Potassium: 5.1 mmol/L (ref 3.5–5.1)
Sodium: 136 mmol/L (ref 135–145)
Total Bilirubin: 0.5 mg/dL (ref 0.3–1.2)
Total Protein: 7.4 g/dL (ref 6.5–8.1)

## 2023-02-23 LAB — CBC WITH DIFFERENTIAL/PLATELET
Abs Immature Granulocytes: 0.04 10*3/uL (ref 0.00–0.07)
Basophils Absolute: 0 10*3/uL (ref 0.0–0.1)
Basophils Relative: 0 %
Eosinophils Absolute: 0.2 10*3/uL (ref 0.0–0.5)
Eosinophils Relative: 2 %
HCT: 31.2 % — ABNORMAL LOW (ref 39.0–52.0)
Hemoglobin: 9.6 g/dL — ABNORMAL LOW (ref 13.0–17.0)
Immature Granulocytes: 1 %
Lymphocytes Relative: 23 %
Lymphs Abs: 1.7 10*3/uL (ref 0.7–4.0)
MCH: 26.7 pg (ref 26.0–34.0)
MCHC: 30.8 g/dL (ref 30.0–36.0)
MCV: 86.7 fL (ref 80.0–100.0)
Monocytes Absolute: 0.7 10*3/uL (ref 0.1–1.0)
Monocytes Relative: 10 %
Neutro Abs: 4.9 10*3/uL (ref 1.7–7.7)
Neutrophils Relative %: 64 %
Platelets: 155 10*3/uL (ref 150–400)
RBC: 3.6 MIL/uL — ABNORMAL LOW (ref 4.22–5.81)
RDW: 20.4 % — ABNORMAL HIGH (ref 11.5–15.5)
WBC: 7.6 10*3/uL (ref 4.0–10.5)
nRBC: 0 % (ref 0.0–0.2)

## 2023-02-23 LAB — LIPASE, BLOOD: Lipase: 32 U/L (ref 11–51)

## 2023-02-23 LAB — TROPONIN I (HIGH SENSITIVITY)
Troponin I (High Sensitivity): 6 ng/L (ref <18)
Troponin I (High Sensitivity): 6 ng/L (ref <18)

## 2023-02-23 MED ORDER — ONDANSETRON HCL 4 MG/2ML IJ SOLN
4.0000 mg | Freq: Once | INTRAMUSCULAR | Status: AC
  Administered 2023-02-23: 4 mg via INTRAVENOUS
  Filled 2023-02-23: qty 2

## 2023-02-23 MED ORDER — MORPHINE SULFATE (PF) 4 MG/ML IV SOLN
4.0000 mg | Freq: Once | INTRAVENOUS | Status: AC
  Administered 2023-02-23: 4 mg via INTRAVENOUS
  Filled 2023-02-23: qty 1

## 2023-02-23 MED ORDER — OXYCODONE HCL 5 MG PO TABS
5.0000 mg | ORAL_TABLET | ORAL | 0 refills | Status: DC | PRN
Start: 2023-02-23 — End: 2023-02-28

## 2023-02-23 MED ORDER — ALUM & MAG HYDROXIDE-SIMETH 200-200-20 MG/5ML PO SUSP
30.0000 mL | Freq: Once | ORAL | Status: AC
  Administered 2023-02-23: 30 mL via ORAL
  Filled 2023-02-23: qty 30

## 2023-02-23 MED ORDER — LIDOCAINE VISCOUS HCL 2 % MT SOLN
15.0000 mL | Freq: Once | OROMUCOSAL | Status: AC
  Administered 2023-02-23: 15 mL via OROMUCOSAL
  Filled 2023-02-23: qty 15

## 2023-02-23 NOTE — ED Provider Notes (Signed)
[facility] AT [facility] Provider Note   CSN: [id number] Arrival date & time: 02/23/23  0022     History  Chief Complaint  Patient presents with   Chest Pain    [name] is a 52 y.o. male.  Presents to the emergency department with chest pain.  Patient reports he was just hospitalized for pancreatitis and has had pain since discharge.       Home Medications Prior to Admission medications   Medication Sig Start Date End Date Taking? Authorizing Provider  oxyCODONE (ROXICODONE) 5 MG immediate release tablet Take 1 tablet (5 mg total) by mouth every 4 (four) hours as needed for severe pain (pain score 7-10). 02/23/23  Yes Jaking Thayer, Canary Brim, MD  famotidine (PEPCID) 20 MG tablet Take 20 mg by mouth 2 (two) times daily.    [provider]  fluticasone (FLONASE) 50 MCG/ACT nasal spray Place 2 sprays into both nostrils 2 (two) times daily as needed for allergies.    [provider]  folic acid (FOLVITE) 1 MG tablet Take 1 tablet (1 mg total) by mouth daily. 09/21/22   Pokhrel, Rebekah Chesterfield, MD  lipase/protease/amylase (CREON) 36000 UNITS CPEP capsule Take 2 capsules (72,000 Units total) by mouth 3 (three) times daily with meals. May also take 1 capsule (36,000 Units total) as needed (with snacks). 01/26/23   Jerald Kief, MD  metoprolol (TOPROL-XL) 200 MG 24 hr tablet Take 200 mg by mouth daily. 11/30/22   [provider]  nitroGLYCERIN (NITROSTAT) 0.4 MG SL tablet Place 0.4 mg under the tongue every 5 (five) minutes as needed for chest pain.    [provider]  ondansetron (ZOFRAN-ODT) 4 MG disintegrating tablet Take 1 tablet (4 mg total) by mouth 2 (two) times daily as needed for nausea or vomiting. Patient taking differently: Take 4 mg by mouth as needed for nausea or vomiting. 01/26/23   Jerald Kief, MD  pantoprazole (PROTONIX) 40 MG tablet Take 1 tablet (40 mg total) by mouth 2 (two) times daily  before a meal. 02/11/23 03/13/23  Dahal, Melina Schools, MD  Simethicone 40 MG/0.6ML LIQD Take 0.6 mLs (40 mg total) by mouth in the morning, at noon, and at bedtime for 7 days. Patient taking differently: Take 40 mg by mouth 3 (three) times daily as needed (for bloating and gas). 02/11/23 02/21/24  Dahal, Melina Schools, MD  sucralfate (CARAFATE) 1 GM/10ML suspension Take 10 mLs (1 g total) by mouth See admin instructions. Take 4 times with meals and at bedtime. Patient taking differently: Take 1 g by mouth See admin instructions. Take 1g by mouth three times a day with meals.  May take an additional 1g with a snack. 01/11/23   Jerald Kief, MD  amitriptyline (ELAVIL) 25 MG tablet Take 1 tablet (25 mg total) by mouth at bedtime. Patient not taking: Reported on 08/08/2019 10/15/18 08/08/19  Rolly Salter, MD      Allergies    Robaxin [methocarbamol], Aspirin, Shellfish-derived products, Trazodone and nefazodone, Adhesive [tape], Latex, Toradol [ketorolac tromethamine], Contrast media [iodinated contrast media], Reglan [metoclopramide], and Salmon [fish oil]    Review of Systems   Review of Systems  Physical Exam Updated Vital Signs BP (!) 161/99   Pulse 72   Temp 98 F (36.7 C)   Resp 16   SpO2 96%  Physical Exam Vitals and nursing note reviewed.  Constitutional:      General: He is not in acute distress.    Appearance:  He is well-developed.  HENT:     Head: Normocephalic and atraumatic.     Mouth/Throat:     Mouth: Mucous membranes are moist.  Eyes:     General: Vision grossly intact. Gaze aligned appropriately.     Extraocular Movements: Extraocular movements intact.     Conjunctiva/sclera: Conjunctivae normal.  Cardiovascular:     Rate and Rhythm: Normal rate and regular rhythm.     Pulses: Normal pulses.     Heart sounds: Normal heart sounds, S1 normal and S2 normal. No murmur heard.    No friction rub. No gallop.  Pulmonary:     Effort: Pulmonary effort is normal. No respiratory distress.      Breath sounds: Normal breath sounds.  Abdominal:     Palpations: Abdomen is soft.     Tenderness: There is no abdominal tenderness. There is no guarding or rebound.     Hernia: No hernia is present.  Musculoskeletal:        General: No swelling.     Cervical back: Full passive range of motion without pain, normal range of motion and neck supple. No pain with movement, spinous process tenderness or muscular tenderness. Normal range of motion.     Right lower leg: No edema.     Left lower leg: No edema.  Skin:    General: Skin is warm and dry.     Capillary Refill: Capillary refill takes less than 2 seconds.     Findings: No ecchymosis, erythema, lesion or wound.  Neurological:     Mental Status: He is alert and oriented to person, place, and time.     GCS: GCS eye subscore is 4. GCS verbal subscore is 5. GCS motor subscore is 6.     Cranial Nerves: Cranial nerves 2-12 are intact.     Sensory: Sensation is intact.     Motor: Motor function is intact. No weakness or abnormal muscle tone.     Coordination: Coordination is intact.  Psychiatric:        Mood and Affect: Mood normal.        Speech: Speech normal.        Behavior: Behavior normal.     ED Results / Procedures / Treatments   Labs (all labs ordered are listed, but only abnormal results are displayed) Labs Reviewed  COMPREHENSIVE METABOLIC PANEL - Abnormal; Notable for the following components:      Result Value   CO2 18 (*)    Glucose, Bld 102 (*)    All other components within normal limits  CBC WITH DIFFERENTIAL/PLATELET - Abnormal; Notable for the following components:   RBC 3.60 (*)    Hemoglobin 9.6 (*)    HCT 31.2 (*)    RDW 20.4 (*)    All other components within normal limits  LIPASE, BLOOD  TROPONIN I (HIGH SENSITIVITY)  TROPONIN I (HIGH SENSITIVITY)    EKG EKG Interpretation Date/Time:  Thursday February 23 2023 00:00:30 EDT Ventricular Rate:  82 PR Interval:  104 QRS Duration:  98 QT  Interval:  352 QTC Calculation: 411 R Axis:   61  Text Interpretation: Sinus rhythm with short PR Nonspecific T wave abnormality Abnormal ECG When compared with ECG of 21-Feb-2023 15:39, T wave inversion less evident in is less pronounced Confirmed by Dione Booze ([id number]) on 02/23/2023 1:21:10 AM  Radiology DG Chest 2 View  Result Date: 02/23/2023 CLINICAL DATA:  Encounter for chest pain EXAM: CHEST - 2 VIEW COMPARISON:  Two days ago  FINDINGS: Generalized airway thickening. No collapse or consolidation. No edema, effusion, or pneumothorax. Normal heart size and mediastinal contours. IMPRESSION: Generalized bronchitic airway thickening. No pneumonia or pulmonary collapse. Electronically Signed   By: Tiburcio Pea M.D.   On: 02/23/2023 04:54   DG Chest 2 View  Result Date: 02/21/2023 CLINICAL DATA:  Chest pain. Right upper quadrant and left upper quadrant abdominal pain. EXAM: CHEST - 2 VIEW COMPARISON:  02/19/2023 FINDINGS: Midline trachea. Normal heart size and mediastinal contours. No pleural effusion or pneumothorax. Mild hyperinflation and interstitial thickening, consistent with the clinical history of smoking. No superimposed consolidation. IMPRESSION: No acute cardiopulmonary disease. Peribronchial thickening which may relate to chronic bronchitis or smoking. Electronically Signed   By: Jeronimo Greaves M.D.   On: 02/21/2023 18:18    Procedures Procedures    Medications Ordered in ED Medications - No data to display  ED Course/ Medical Decision Making/ A&P                                 Medical Decision Making Amount and/or Complexity of Data Reviewed Labs: ordered. Radiology: ordered.   Presents for evaluation of abdominal and chest pain.  Patient with chronic pancreatitis.  He was hospitalized and evaluated by GI, has multiple procedure scheduled as an outpatient next week.  Patient does not appear toxic.  Abdominal exam is nonfocal.  Lab work is stable.  I do not feel that  the patient requires repeat hospitalization.  Provided analgesia.  Follow-up with GI as scheduled.        Final Clinical Impression(s) / ED Diagnoses Final diagnoses:  Alcohol-induced chronic pancreatitis (HCC)    Rx / DC Orders ED Discharge Orders          Ordered    oxyCODONE (ROXICODONE) 5 MG immediate release tablet  Every 4 hours PRN        02/23/23 0617              Gilda Crease, MD 02/23/23 [phone]

## 2023-02-23 NOTE — ED Triage Notes (Addendum)
Sudden onset of chest pain worse on exertion starting at 7pm radiating to neck. 8/10 has not taken nitros bc had ED meds earlier today. Scheduled for another ablation. He just was discharged for pancreatitis and is still complaining of pain. Pt states he has not been drinking since discharge but did eat tacos, mac and cheese, baked chicken

## 2023-02-23 NOTE — ED Notes (Signed)
Declined repeat vitals.

## 2023-02-24 ENCOUNTER — Encounter (HOSPITAL_COMMUNITY): Payer: Self-pay | Admitting: Gastroenterology

## 2023-02-24 NOTE — Progress Notes (Signed)
Attempted to obtain medical history for pre op call via telephone, unable to reach at this time. HIPAA compliant voicemail message left requesting return call to pre surgical testing department.

## 2023-02-27 ENCOUNTER — Emergency Department (HOSPITAL_COMMUNITY): Payer: MEDICAID

## 2023-02-27 ENCOUNTER — Inpatient Hospital Stay (HOSPITAL_COMMUNITY)
Admission: EM | Admit: 2023-02-27 | Discharge: 2023-03-07 | DRG: 683 | Disposition: A | Discharge status: Home / Self Care | Payer: MEDICAID | Attending: Internal Medicine | Admitting: Internal Medicine

## 2023-02-27 ENCOUNTER — Other Ambulatory Visit: Payer: Self-pay

## 2023-02-27 DIAGNOSIS — Z91040 Latex allergy status: Secondary | ICD-10-CM

## 2023-02-27 DIAGNOSIS — Z8711 Personal history of peptic ulcer disease: Secondary | ICD-10-CM

## 2023-02-27 DIAGNOSIS — E7800 Pure hypercholesterolemia, unspecified: Secondary | ICD-10-CM | POA: Diagnosis present

## 2023-02-27 DIAGNOSIS — J4489 Other specified chronic obstructive pulmonary disease: Secondary | ICD-10-CM | POA: Diagnosis present

## 2023-02-27 DIAGNOSIS — K2980 Duodenitis without bleeding: Secondary | ICD-10-CM

## 2023-02-27 DIAGNOSIS — I456 Pre-excitation syndrome: Secondary | ICD-10-CM | POA: Diagnosis present

## 2023-02-27 DIAGNOSIS — F17210 Nicotine dependence, cigarettes, uncomplicated: Secondary | ICD-10-CM | POA: Diagnosis present

## 2023-02-27 DIAGNOSIS — K2289 Other specified disease of esophagus: Secondary | ICD-10-CM | POA: Diagnosis present

## 2023-02-27 DIAGNOSIS — Z8249 Family history of ischemic heart disease and other diseases of the circulatory system: Secondary | ICD-10-CM

## 2023-02-27 DIAGNOSIS — K449 Diaphragmatic hernia without obstruction or gangrene: Secondary | ICD-10-CM | POA: Diagnosis present

## 2023-02-27 DIAGNOSIS — K862 Cyst of pancreas: Secondary | ICD-10-CM

## 2023-02-27 DIAGNOSIS — Z1152 Encounter for screening for COVID-19: Secondary | ICD-10-CM

## 2023-02-27 DIAGNOSIS — Z885 Allergy status to narcotic agent status: Secondary | ICD-10-CM

## 2023-02-27 DIAGNOSIS — N50819 Testicular pain, unspecified: Secondary | ICD-10-CM | POA: Diagnosis present

## 2023-02-27 DIAGNOSIS — R531 Weakness: Secondary | ICD-10-CM

## 2023-02-27 DIAGNOSIS — M5417 Radiculopathy, lumbosacral region: Secondary | ICD-10-CM

## 2023-02-27 DIAGNOSIS — E875 Hyperkalemia: Secondary | ICD-10-CM

## 2023-02-27 DIAGNOSIS — I10 Essential (primary) hypertension: Secondary | ICD-10-CM | POA: Diagnosis present

## 2023-02-27 DIAGNOSIS — R1084 Generalized abdominal pain: Principal | ICD-10-CM

## 2023-02-27 DIAGNOSIS — Z808 Family history of malignant neoplasm of other organs or systems: Secondary | ICD-10-CM

## 2023-02-27 DIAGNOSIS — K3189 Other diseases of stomach and duodenum: Secondary | ICD-10-CM | POA: Diagnosis present

## 2023-02-27 DIAGNOSIS — Z79899 Other long term (current) drug therapy: Secondary | ICD-10-CM

## 2023-02-27 DIAGNOSIS — I480 Paroxysmal atrial fibrillation: Secondary | ICD-10-CM | POA: Diagnosis present

## 2023-02-27 DIAGNOSIS — F1410 Cocaine abuse, uncomplicated: Secondary | ICD-10-CM | POA: Diagnosis present

## 2023-02-27 DIAGNOSIS — K766 Portal hypertension: Secondary | ICD-10-CM | POA: Diagnosis present

## 2023-02-27 DIAGNOSIS — R32 Unspecified urinary incontinence: Secondary | ICD-10-CM | POA: Diagnosis present

## 2023-02-27 DIAGNOSIS — F319 Bipolar disorder, unspecified: Secondary | ICD-10-CM | POA: Diagnosis present

## 2023-02-27 DIAGNOSIS — F4310 Post-traumatic stress disorder, unspecified: Secondary | ICD-10-CM | POA: Diagnosis present

## 2023-02-27 DIAGNOSIS — Z811 Family history of alcohol abuse and dependence: Secondary | ICD-10-CM

## 2023-02-27 DIAGNOSIS — K863 Pseudocyst of pancreas: Secondary | ICD-10-CM | POA: Diagnosis present

## 2023-02-27 DIAGNOSIS — D573 Sickle-cell trait: Secondary | ICD-10-CM | POA: Diagnosis present

## 2023-02-27 DIAGNOSIS — Z881 Allergy status to other antibiotic agents status: Secondary | ICD-10-CM

## 2023-02-27 DIAGNOSIS — Z9151 Personal history of suicidal behavior: Secondary | ICD-10-CM

## 2023-02-27 DIAGNOSIS — F419 Anxiety disorder, unspecified: Secondary | ICD-10-CM | POA: Diagnosis present

## 2023-02-27 DIAGNOSIS — G40909 Epilepsy, unspecified, not intractable, without status epilepticus: Secondary | ICD-10-CM | POA: Diagnosis present

## 2023-02-27 DIAGNOSIS — K5520 Angiodysplasia of colon without hemorrhage: Secondary | ICD-10-CM

## 2023-02-27 DIAGNOSIS — Z91048 Other nonmedicinal substance allergy status: Secondary | ICD-10-CM

## 2023-02-27 DIAGNOSIS — Z888 Allergy status to other drugs, medicaments and biological substances status: Secondary | ICD-10-CM

## 2023-02-27 DIAGNOSIS — N4889 Other specified disorders of penis: Secondary | ICD-10-CM | POA: Diagnosis present

## 2023-02-27 DIAGNOSIS — M546 Pain in thoracic spine: Secondary | ICD-10-CM

## 2023-02-27 DIAGNOSIS — R338 Other retention of urine: Secondary | ICD-10-CM

## 2023-02-27 DIAGNOSIS — Z91041 Radiographic dye allergy status: Secondary | ICD-10-CM

## 2023-02-27 DIAGNOSIS — K860 Alcohol-induced chronic pancreatitis: Secondary | ICD-10-CM | POA: Diagnosis present

## 2023-02-27 DIAGNOSIS — K31819 Angiodysplasia of stomach and duodenum without bleeding: Secondary | ICD-10-CM

## 2023-02-27 DIAGNOSIS — E860 Dehydration: Secondary | ICD-10-CM | POA: Diagnosis present

## 2023-02-27 DIAGNOSIS — Z886 Allergy status to analgesic agent status: Secondary | ICD-10-CM

## 2023-02-27 DIAGNOSIS — G8929 Other chronic pain: Secondary | ICD-10-CM | POA: Diagnosis present

## 2023-02-27 DIAGNOSIS — N179 Acute kidney failure, unspecified: Principal | ICD-10-CM | POA: Diagnosis present

## 2023-02-27 DIAGNOSIS — Z91013 Allergy to seafood: Secondary | ICD-10-CM

## 2023-02-27 DIAGNOSIS — K219 Gastro-esophageal reflux disease without esophagitis: Secondary | ICD-10-CM | POA: Diagnosis present

## 2023-02-27 DIAGNOSIS — R339 Retention of urine, unspecified: Secondary | ICD-10-CM | POA: Diagnosis present

## 2023-02-27 LAB — CBC WITH DIFFERENTIAL/PLATELET
Abs Immature Granulocytes: 0.02 10*3/uL (ref 0.00–0.07)
Basophils Absolute: 0 10*3/uL (ref 0.0–0.1)
Basophils Relative: 1 %
Eosinophils Absolute: 0.2 10*3/uL (ref 0.0–0.5)
Eosinophils Relative: 2 %
HCT: 31 % — ABNORMAL LOW (ref 39.0–52.0)
Hemoglobin: 9.6 g/dL — ABNORMAL LOW (ref 13.0–17.0)
Immature Granulocytes: 0 %
Lymphocytes Relative: 37 %
Lymphs Abs: 2.4 10*3/uL (ref 0.7–4.0)
MCH: 26.2 pg (ref 26.0–34.0)
MCHC: 31 g/dL (ref 30.0–36.0)
MCV: 84.7 fL (ref 80.0–100.0)
Monocytes Absolute: 0.6 10*3/uL (ref 0.1–1.0)
Monocytes Relative: 10 %
Neutro Abs: 3.3 10*3/uL (ref 1.7–7.7)
Neutrophils Relative %: 50 %
Platelets: 164 10*3/uL (ref 150–400)
RBC: 3.66 MIL/uL — ABNORMAL LOW (ref 4.22–5.81)
RDW: 20 % — ABNORMAL HIGH (ref 11.5–15.5)
WBC: 6.5 10*3/uL (ref 4.0–10.5)
nRBC: 0 % (ref 0.0–0.2)

## 2023-02-27 LAB — DRUG PROFILE, UR, 9 DRUGS (LABCORP)
Amphetamines, Urine: NEGATIVE ng/mL
Barbiturate, Ur: NEGATIVE ng/mL
Benzodiazepine Quant, Ur: NEGATIVE ng/mL
Cannabinoid Quant, Ur: NEGATIVE ng/mL
Cocaine (Metab.): POSITIVE ng/mL — AB
Methadone Screen, Urine: NEGATIVE ng/mL
Opiate Quant, Ur: NEGATIVE ng/mL
Phencyclidine, Ur: NEGATIVE ng/mL
Propoxyphene, Urine: NEGATIVE ng/mL

## 2023-02-27 LAB — COMPREHENSIVE METABOLIC PANEL
ALT: 23 U/L (ref 0–44)
AST: 35 U/L (ref 15–41)
Albumin: 4.5 g/dL (ref 3.5–5.0)
Alkaline Phosphatase: 92 U/L (ref 38–126)
Anion gap: 11 (ref 5–15)
BUN: 19 mg/dL (ref 6–20)
CO2: 20 mmol/L — ABNORMAL LOW (ref 22–32)
Calcium: 9.7 mg/dL (ref 8.9–10.3)
Chloride: 106 mmol/L (ref 98–111)
Creatinine, Ser: 1.64 mg/dL — ABNORMAL HIGH (ref 0.61–1.24)
GFR, Estimated: 50 mL/min — ABNORMAL LOW (ref >60)
Glucose, Bld: 82 mg/dL (ref 70–99)
Potassium: 4.1 mmol/L (ref 3.5–5.1)
Sodium: 137 mmol/L (ref 135–145)
Total Bilirubin: 0.7 mg/dL (ref 0.3–1.2)
Total Protein: 8.4 g/dL — ABNORMAL HIGH (ref 6.5–8.1)

## 2023-02-27 LAB — LIPASE, BLOOD: Lipase: 21 U/L (ref 11–51)

## 2023-02-27 LAB — I-STAT CHEM 8, ED
BUN: 19 mg/dL (ref 6–20)
Calcium, Ion: 1.26 mmol/L (ref 1.15–1.40)
Chloride: 110 mmol/L (ref 98–111)
Creatinine, Ser: 1.8 mg/dL — ABNORMAL HIGH (ref 0.61–1.24)
Glucose, Bld: 82 mg/dL (ref 70–99)
HCT: 38 % — ABNORMAL LOW (ref 39.0–52.0)
Hemoglobin: 12.9 g/dL — ABNORMAL LOW (ref 13.0–17.0)
Potassium: 4.3 mmol/L (ref 3.5–5.1)
Sodium: 140 mmol/L (ref 135–145)
TCO2: 21 mmol/L — ABNORMAL LOW (ref 22–32)

## 2023-02-27 MED ORDER — LACTATED RINGERS IV BOLUS
1000.0000 mL | Freq: Once | INTRAVENOUS | Status: AC
  Administered 2023-02-28: 1000 mL via INTRAVENOUS

## 2023-02-27 MED ORDER — FAMOTIDINE IN NACL 20-0.9 MG/50ML-% IV SOLN
20.0000 mg | Freq: Once | INTRAVENOUS | Status: AC
  Administered 2023-02-28: 20 mg via INTRAVENOUS
  Filled 2023-02-27: qty 50

## 2023-02-27 MED ORDER — ONDANSETRON HCL 4 MG/2ML IJ SOLN
4.0000 mg | Freq: Once | INTRAMUSCULAR | Status: AC
  Administered 2023-02-28: 4 mg via INTRAVENOUS
  Filled 2023-02-27: qty 2

## 2023-02-27 MED ORDER — FENTANYL CITRATE PF 50 MCG/ML IJ SOSY
50.0000 ug | PREFILLED_SYRINGE | Freq: Once | INTRAMUSCULAR | Status: AC
  Administered 2023-02-28: 50 ug via INTRAVENOUS
  Filled 2023-02-27: qty 1

## 2023-02-27 MED ORDER — PANTOPRAZOLE SODIUM 40 MG IV SOLR: 40.0000 mg | Freq: Once | INTRAVENOUS | Status: DC

## 2023-02-27 NOTE — ED Notes (Signed)
Noted pt screaming and crying in the bathroom. Pt states it hurts so bad when I pee. Triage RN aware

## 2023-02-27 NOTE — ED Notes (Signed)
I called patient to recheck vitals and no one respond

## 2023-02-27 NOTE — ED Provider Triage Note (Signed)
Emergency Medicine Provider Triage Evaluation Note  [name] , a 52 y.o. male  was evaluated in triage.  Pt complains of penile/testicular pain.  Review of Systems  Positive:  Negative:   Physical Exam  BP (!) 149/103 (BP Location: Left Arm)   Pulse 77   Temp 98.2 F (36.8 C) (Oral)   Resp 16   SpO2 100%  Gen:   Awake, no distress   Resp:  Normal effort  MSK:   Moves extremities without difficulty  Other:    Medical Decision Making  Medically screening exam initiated at 6:42 PM.  Appropriate orders placed.  [name] was informed that the remainder of the evaluation will be completed by another provider, this initial triage assessment does not replace that evaluation, and the importance of remaining in the ED until their evaluation is complete.  Patient with penile/testicular pain x3 days. Screaming in pain but when he is yelling at staff he no longer appears to be in pain. States that he has some dysuria and possibly some hematuria. Endorses nausea but denies vomiting and diarrhea. Denies penile discharge.   [name], [name] 02/27/23 [phone]

## 2023-02-27 NOTE — ED Triage Notes (Signed)
Pt arrived reporting abdominal pain a few days. Denies any other symptoms. Pt boisterous and rude with staff. Patient escorted to lobby to wait for provider eval

## 2023-02-27 NOTE — ED Notes (Signed)
Pt was outside smoking when called the first time for vitals

## 2023-02-27 NOTE — ED Provider Notes (Signed)
[place] [facility]ENC[facility] DEPARTMENT AT [facility] Provider Note   CSN: [id number] Arrival date & time: 02/27/23  1528     History  Chief Complaint  Patient presents with   Abdominal Pain    [name] is a 52 y.o. male.   Abdominal Pain Associated symptoms: chest pain      52 year old male with medical history significant for chronic alcoholic pancreatitis with pancreatic pseudocyst, paroxysmal atrial fibrillation not on anticoagulation, WPW status post ablation, HTN, bipolar disorder, GERD, polysubstance abuse who presents to the emergency department with abdominal pain.  Patient states that he has had pain that progressed over the weekend.  He feels as if his pancreatitis could be flaring up.  He also endorses sharp discomfort in the right lower quadrant of his abdomen, no radiation, no nausea or vomiting.  Endorses burning while urinating with discomfort suprapubically radiating down to his scrotum. He is not concerned for sexually transmitted infection. No fevers or chills. He also now endorses a sharp left sided chest discomfort, not alleviated with his home GI meds (he takes creon and carafate in addition to pepcid and protonix).  He also states that since this past Wednesday he has been incontinent of urine.  Home Medications Prior to Admission medications   Medication Sig Start Date End Date Taking? Authorizing Provider  pregabalin (LYRICA) 100 MG capsule Take 100 mg by mouth 2 (two) times daily. 02/26/23  Yes [provider]  famotidine (PEPCID) 20 MG tablet Take 20 mg by mouth 2 (two) times daily.    [provider]  fluticasone (FLONASE) 50 MCG/ACT nasal spray Place 2 sprays into both nostrils 2 (two) times daily as needed for allergies.    [provider]  folic acid (FOLVITE) 1 MG tablet Take 1 tablet (1 mg total) by mouth daily. 09/21/22   Pokhrel, Rebekah Chesterfield, MD  lipase/protease/amylase (CREON) 36000 UNITS CPEP capsule Take 2  capsules (72,000 Units total) by mouth 3 (three) times daily with meals. May also take 1 capsule (36,000 Units total) as needed (with snacks). 01/26/23   Jerald Kief, MD  metoprolol (TOPROL-XL) 200 MG 24 hr tablet Take 200 mg by mouth daily. 11/30/22   [provider]  nitroGLYCERIN (NITROSTAT) 0.4 MG SL tablet Place 0.4 mg under the tongue every 5 (five) minutes as needed for chest pain.    [provider]  ondansetron (ZOFRAN-ODT) 4 MG disintegrating tablet Take 1 tablet (4 mg total) by mouth 2 (two) times daily as needed for nausea or vomiting. Patient taking differently: Take 4 mg by mouth as needed for nausea or vomiting. 01/26/23   Jerald Kief, MD  oxyCODONE (ROXICODONE) 5 MG immediate release tablet Take 1 tablet (5 mg total) by mouth every 4 (four) hours as needed for severe pain (pain score 7-10). 02/23/23   Gilda Crease, MD  pantoprazole (PROTONIX) 40 MG tablet Take 1 tablet (40 mg total) by mouth 2 (two) times daily before a meal. 02/11/23 03/13/23  Dahal, Melina Schools, MD  Simethicone 40 MG/0.6ML LIQD Take 0.6 mLs (40 mg total) by mouth in the morning, at noon, and at bedtime for 7 days. Patient taking differently: Take 40 mg by mouth 3 (three) times daily as needed (for bloating and gas). 02/11/23 02/21/24  Dahal, Melina Schools, MD  sucralfate (CARAFATE) 1 GM/10ML suspension Take 10 mLs (1 g total) by mouth See admin instructions. Take 4 times with meals and at bedtime. Patient taking differently: Take 1 g by mouth See admin  instructions. Take 1g by mouth three times a day with meals.  May take an additional 1g with a snack. 01/11/23   Jerald Kief, MD  amitriptyline (ELAVIL) 25 MG tablet Take 1 tablet (25 mg total) by mouth at bedtime. Patient not taking: Reported on 08/08/2019 10/15/18 08/08/19  Rolly Salter, MD      Allergies    Robaxin [methocarbamol], Aspirin, Shellfish-derived products, Trazodone and nefazodone, Adhesive [tape], Latex, Toradol [ketorolac  tromethamine], Contrast media [iodinated contrast media], Reglan [metoclopramide], and Salmon [fish oil]    Review of Systems   Review of Systems  Cardiovascular:  Positive for chest pain.  Gastrointestinal:  Positive for abdominal pain.  All other systems reviewed and are negative.   Physical Exam Updated Vital Signs BP (!) 141/95   Pulse 75   Temp 97.8 F (36.6 C) (Oral)   Resp 14   SpO2 100%  Physical Exam Vitals and nursing note reviewed.  Constitutional:      General: He is not in acute distress.    Appearance: He is well-developed.  HENT:     Head: Normocephalic and atraumatic.  Eyes:     Conjunctiva/sclera: Conjunctivae normal.  Cardiovascular:     Rate and Rhythm: Normal rate and regular rhythm.     Heart sounds: No murmur heard. Pulmonary:     Effort: Pulmonary effort is normal. No respiratory distress.     Breath sounds: Normal breath sounds.  Abdominal:     Palpations: Abdomen is soft.     Tenderness: There is abdominal tenderness in the right lower quadrant, suprapubic area and left upper quadrant. There is guarding. There is no rebound.  Musculoskeletal:        General: No swelling.     Cervical back: Neck supple.  Skin:    General: Skin is warm and dry.     Capillary Refill: Capillary refill takes less than 2 seconds.  Neurological:     Mental Status: He is alert.     Comments: No cranial nerve deficit.  5 out of 5 strength in the bilateral upper extremities with intact sensation to light touch.  4-5 strength in the right lower extremity, 5 out of 5 strength in the left lower extremity, intact sensation to light touch.  Psychiatric:        Mood and Affect: Mood normal.     ED Results / Procedures / Treatments   Labs (all labs ordered are listed, but only abnormal results are displayed) Labs Reviewed  COMPREHENSIVE METABOLIC PANEL - Abnormal; Notable for the following components:      Result Value   CO2 20 (*)    Creatinine, Ser 1.64 (*)    Total  Protein 8.4 (*)    GFR, Estimated 50 (*)    All other components within normal limits  CBC WITH DIFFERENTIAL/PLATELET - Abnormal; Notable for the following components:   RBC 3.66 (*)    Hemoglobin 9.6 (*)    HCT 31.0 (*)    RDW 20.0 (*)    All other components within normal limits  RAPID URINE DRUG SCREEN, HOSP PERFORMED - Abnormal; Notable for the following components:   Cocaine POSITIVE (*)    Amphetamines POSITIVE (*)    All other components within normal limits  I-STAT CHEM 8, ED - Abnormal; Notable for the following components:   Creatinine, Ser 1.80 (*)    TCO2 21 (*)    Hemoglobin 12.9 (*)    HCT 38.0 (*)    All other  components within normal limits  RESP PANEL BY RT-PCR (RSV, FLU A&B, COVID)  RVPGX2  LIPASE, BLOOD  URINALYSIS, W/ REFLEX TO CULTURE (INFECTION SUSPECTED)  SEDIMENTATION RATE  C-REACTIVE PROTEIN  TROPONIN I (HIGH SENSITIVITY)  TROPONIN I (HIGH SENSITIVITY)    EKG EKG Interpretation Date/Time:  Tuesday February 28 2023 00:28:22 EDT Ventricular Rate:  72 PR Interval:  108 QRS Duration:  76 QT Interval:  344 QTC Calculation: 377 R Axis:   72  Text Interpretation: Sinus rhythm Short PR interval Biatrial enlargement Nonspecific T abnormalities, lateral leads Confirmed by Ernie Avena ([id number]) on 02/28/2023 1:17:51 AM  Radiology MR Lumbar Spine W Wo Contrast  Result Date: 02/28/2023 CLINICAL DATA:  52 year old male with flank and back pain, urinary retention, left lower extremity weakness. Acute myelopathy. EXAM: MRI LUMBAR SPINE WITHOUT AND WITH CONTRAST TECHNIQUE: Multiplanar and multiecho pulse sequences of the lumbar spine were obtained without and with intravenous contrast. CONTRAST:  5.93mL GADAVIST GADOBUTROL 1 MMOL/ML IV SOLN in conjunction with contrast enhanced imaging of the thoracic spine reported separately. COMPARISON:  Lumbar MRI 05/21/2022. Thoracic MRI today reported separately. CT Abdomen and Pelvis. Yesterday FINDINGS: Segmentation: Normal,  concordant with the thoracic numbering today and also the previous lumbar MRI in January. Alignment: Stable straightening of lumbar lordosis, with subtle degenerative appearing retrolisthesis of L3 on L4 through L5-S1. No significant scoliosis. Vertebrae: Normal background bone marrow signal. Mild acute on chronic L5-S1 endplate degeneration including far lateral mild endplate marrow edema which is greater on the right. See additional details of that level below. No other acute osseous abnormality identified. Intact visible sacrum and SI joints. Conus medullaris and cauda equina: Conus extends to the T12-L1 level. No convincing lower spinal cord or conus signal abnormality. No abnormal intradural enhancement. Cauda equina nerve roots are within normal limits. No dural thickening. Paraspinal and other soft tissues: Stable to the CT Abdomen and Pelvis yesterday, including abnormal pancreas with partially visible cystic masses in the tail. Negative visualized posterior paraspinal soft tissues. Disc levels: T11-T12 and T12-L1:  Negative. L1-L2: Mild disc space loss and disc bulging without stenosis, stable. L2-L3: Mild circumferential disc bulge, facet and ligament flavum hypertrophy without stenosis is stable. L3-L4: Subtle retrolisthesis. Circumferential disc bulge with small posterior annular fissure (series 4, image 23). Mild facet and ligament flavum hypertrophy. No spinal or lateral recess stenosis. Mild L3 foraminal stenosis on the left is stable. L4-L5: Chronic disc bulging with broad-based posterior central disc protrusion and annular fissure (series 4, image 30). Ligament flavum and mild facet hypertrophy. Disc effaces the descending L5 nerve roots in the lateral recesses, without spinal stenosis. No foraminal stenosis. L5-S1: Moderate to severe disc space loss and evidence of vacuum disc. Circumferential disc osteophyte complex with a left paracentral posterior component (series 4, image 35). Chronically  effaced descending left S1 nerve roots in the lateral recesses although no convincing lateral recess stenosis. No spinal stenosis. Mild to moderate bilateral L5 foraminal stenosis appears stable and greater on the right. IMPRESSION: 1. Stable lumbar spine degeneration from a January MRI with no lumbar spinal stenosis. Chronic disc degeneration which could be a source of L5 and S1 radiculitis. And up to moderate chronic bilateral L5 neural foraminal stenosis. 2. Conus medullaris and cauda equina nerve roots are stable and within normal limits. See Thoracic MRI today reported separately. 3. Abnormal pancreas. See details on CT Abdomen and Pelvis yesterday. Electronically Signed   By: Odessa Fleming M.D.   On: 02/28/2023 05:21   MR THORACIC SPINE  W WO CONTRAST  Result Date: 02/28/2023 CLINICAL DATA:  52 year old male with flank and back pain, urinary retention, left lower extremity weakness. Acute myelopathy. EXAM: MRI THORACIC WITHOUT AND WITH CONTRAST TECHNIQUE: Multiplanar and multiecho pulse sequences of the thoracic spine were obtained without and with intravenous contrast. CONTRAST:  5.65mL GADAVIST GADOBUTROL 1 MMOL/ML IV SOLN COMPARISON:  Chest radiographs 02/23/2023.  CTA chest 12/06/2021. Cervical spine CT 03/05/2020. FINDINGS: Limited cervical spine imaging: Age advanced cervical spine degeneration, appearing similar to the 2021 CT. Altered cervical vertebral marrow signal at C4 and C5 corresponds to conspicuous degenerative appearing sclerosis on the prior CT. Mild chronic spondylolisthesis. Evidence of degenerative cervical spinal stenosis at C3-C4. Thoracic spine segmentation:  Normal on the comparison CTA. Alignment: Normal thoracic kyphosis. No thoracic spondylolisthesis. Vertebrae: Normal background thoracic vertebral marrow signal. Maintained vertebral body height. No marrow edema or evidence of acute osseous abnormality. Mild chronic inferior endplate degeneration at T1. Cord: Intermittent mild motion  artifact on the axial thoracic images. And on both axial images (especially series 21) and sagittal T2/stir (series 19, image 10), it is difficult to exclude scattered abnormal increased signal within the thoracic spinal cord. The levels below T10 would be spared. However, there is no cord expansion, and no evidence of abnormal spinal cord or intradural enhancement. No dural thickening. Furthermore, thoracic spinal cord volume seems relatively normal. The conus medullaris appears to remain normal at T12-L1, see also lumbar MRI today reported separately. Paraspinal and other soft tissues: Negative. Disc levels: Capacious thoracic spinal canal little to no age advanced thoracic spine degeneration as follows T1-T2: Disc desiccation, mild disc space loss. Mild disc bulging. No stenosis. T2-T3: Disc desiccation.  Otherwise negative. T7-T8: Minor disc desiccation.  Otherwise negative. IMPRESSION: 1. Renal versus Artifactual thoracic spinal cord T2 signal heterogeneity at the levels T5 through T10. No associated cord expansion, edema, or enhancement. Subsequently, difficult to exclude chronic thoracic demyelination or transverse myelitis. 2. Capacious thoracic spinal canal with minimal for age thoracic spine degeneration. No thoracic spinal stenosis or neural impingement. 3. Partially visible chronically advanced cervical spine degeneration, similar to a 2021 CT. Suspected degenerative spinal stenosis at C3-C4. Electronically Signed   By: Odessa Fleming M.D.   On: 02/28/2023 05:13   CT ABDOMEN PELVIS WO CONTRAST  Result Date: 02/28/2023 CLINICAL DATA:  Left-sided flank pain, history of pancreatitis, initial encounter EXAM: CT ABDOMEN AND PELVIS WITHOUT CONTRAST TECHNIQUE: Multidetector CT imaging of the abdomen and pelvis was performed following the standard protocol without IV contrast. RADIATION DOSE REDUCTION: This exam was performed according to the departmental dose-optimization program which includes automated exposure  control, adjustment of the mA and/or kV according to patient size and/or use of iterative reconstruction technique. COMPARISON:  01/25/2023 FINDINGS: Lower chest: Lung bases are free of acute infiltrate or sizable effusion. Hepatobiliary: No focal liver abnormality is seen. No gallstones, gallbladder wall thickening, or biliary dilatation. Pancreas: Scattered calcifications consistent with chronic pancreatitis are again seen. Cystic lesions are noted in the body and tail of the pancreas. These are also stable from the prior exam. The largest of these measures 3.9 cm in the pancreatic body. Spleen: Normal in size without focal abnormality. Adrenals/Urinary Tract: Adrenal glands are within normal limits.Kidneys demonstrate a normal appearance without renal calculi. No obstructive changes are seen. The bladder is well distended. Stomach/Bowel: Scattered fecal material is noted throughout the colon without obstructive change. The appendix is within normal limits. Small bowel is unremarkable. Stomach is within normal limits as well. Vascular/Lymphatic: Aortic  atherosclerosis. No enlarged abdominal or pelvic lymph nodes. Reproductive: Prostate is unremarkable. Other: No abdominal wall hernia or abnormality. No abdominopelvic ascites. Musculoskeletal: No acute or significant osseous findings. IMPRESSION: Changes of chronic pancreatitis with pseudocyst formation. No acute abnormality is identified to correspond with the given clinical history. Electronically Signed   By: Alcide Clever M.D.   On: 02/28/2023 01:12   US SCROTUM W/DOPPLER  Result Date: 02/27/2023 CLINICAL DATA:  Bilateral testicular pain for 2 days. EXAM: SCROTAL ULTRASOUND DOPPLER ULTRASOUND OF THE TESTICLES TECHNIQUE: Complete ultrasound examination of the testicles, epididymis, and other scrotal structures was performed. Color and spectral Doppler ultrasound were also utilized to evaluate blood flow to the testicles. COMPARISON:  01/25/2023. FINDINGS:  Right testicle Measurements: 3.0 x 1.7 x 2.3 cm. Heterogeneous echotexture, microlithiasis visualized. Left testicle Measurements: 3.4 x 1.8 x 2.3 cm. Heterogeneous echotexture, microlithiasis visualized. Right epididymis:  Normal in size and appearance. Left epididymis: Normal in size. A cyst is noted in the epididymal head measuring 0.4 x 0.3 x 0.4 cm. Hydrocele:  None visualized. Varicocele:  None visualized. Pulsed Doppler interrogation of both testes demonstrates normal low resistance arterial and venous waveforms bilaterally. IMPRESSION: 1. No evidence of testicular torsion. 2. 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 Current literature suggests that testicular microlithiasis is not a significant independent risk factor for development of testicular carcinoma, and that follow up imaging is not warranted in the absence of other risk factors. Monthly testicular self-examination and annual physical exams are considered appropriate surveillance. If patient has other risk factors for testicular carcinoma, then referral to Urology should be considered. (Reference: DeCastro, et al.: A 5-Year Follow up Study of Asymptomatic Men with Testicular Microlithiasis. J Urol 2008; 179:1420-1423.) 3. Left epididymal head cyst. Electronically Signed   By: Thornell Sartorius M.D.   On: 02/27/2023 22:18    Procedures Procedures    Medications Ordered in ED Medications  fentaNYL (SUBLIMAZE) injection 50 mcg (50 mcg Intravenous Given 02/28/23 0012)  ondansetron (ZOFRAN) injection 4 mg (4 mg Intravenous Given 02/28/23 0011)  lactated ringers bolus 1,000 mL (0 mLs Intravenous Stopped 02/28/23 0256)  famotidine (PEPCID) IVPB 20 mg premix (0 mg Intravenous Stopped 02/28/23 0045)  fentaNYL (SUBLIMAZE) injection 50 mcg (50 mcg Intravenous Given 02/28/23 0149)  LORazepam (ATIVAN) injection 1 mg (1 mg Intravenous Given 02/28/23 0352)  fentaNYL (SUBLIMAZE) injection 50 mcg (50 mcg Intravenous Given  02/28/23 0352)  gadobutrol (GADAVIST) 1 MMOL/ML injection 5.5 mL (5.5 mLs Intravenous Contrast Given 02/28/23 0437)    ED Course/ Medical Decision Making/ A&P                                 Medical Decision Making Amount and/or Complexity of Data Reviewed Labs: ordered. Radiology: ordered.  Risk Prescription drug management. Decision regarding hospitalization.     52 year old male with medical history significant for chronic alcoholic pancreatitis with pancreatic pseudocyst, paroxysmal atrial fibrillation not on anticoagulation, WPW status post ablation, HTN, bipolar disorder, GERD, polysubstance abuse who presents to the emergency department with abdominal pain.  Patient states that he has had pain that progressed over the weekend.  He feels as if his pancreatitis could be flaring up.  He also endorses sharp discomfort in the right lower quadrant of his abdomen, no radiation, no nausea or vomiting.  Endorses burning while urinating with discomfort suprapubically radiating down to his scrotum. He is not concerned for sexually transmitted infection. No fevers or  chills. He also now endorses a sharp left sided chest discomfort, not alleviated with his home GI meds (he takes creon and carafate in addition to pepcid and protonix).  He also states that since this past Wednesday he has been incontinent of urine.  On arrival, the patient was afebrile, not tachycardic or tachypneic, hypertensive BP 149/103, saturating 100% on room air.  Physical exam revealed diffuse abdominal tenderness to palpation, some weakness in the right lower extremity noted on exam.  Differential diagnosis is broad and includes small bowel obstruction, other acute intra-abdominal emergency, UTI, acute urinary retention, discitis/osteomyelitis, cauda equina syndrome, nephrolithiasis, pancreatitis.  No ripping or tearing component, low concern for aortic dissection.  Low concern for AAA.  The access was obtained and the  patient was administered IV fluid bolus, IV fentanyl, IV Zofran and IV Pepcid.  Labs: No leukocytosis, anemia to 9.6 noted, CMP with evidence of an AKI with a serum creatinine of 1.64 from baseline of around 1-1.3, ESR normal, lipase normal,'s COVID-19 influenza PCR testing negative, UDS positive for cocaine and amphetamines, troponin negative, urinalysis negative for UTI or hematuria.  Scrotal US ordered in triage due to pain radiating to scrotum: IMPRESSION:  1. No evidence of testicular torsion.  2. Heterogeneous echotexture of the testicles bilaterally with  microlithiasis. Current literature suggests that testicular  microlithiasis is not a significant independent risk factor for  development of testicular carcinoma, and that follow up imaging is  not warranted in the absence of other risk factors. Monthly  testicular self-examination and annual physical exams are considered  appropriate surveillance. If patient has other risk factors for  testicular carcinoma, then referral to Urology should be considered.  (Reference: DeCastro, et al.: A 5-Year Follow up Study of  Asymptomatic Men with Testicular Microlithiasis. J Urol 2008;  179:1420-1423.)  3. Left epididymal head cyst.    CT Abd Pel:  IMPRESSION:  Changes of chronic pancreatitis with pseudocyst formation.    No acute abnormality is identified to correspond with the given  clinical history.    Bladder scan revealed 203 cc of urine in the bladder and the patient is having ongoing leakage of urine with incontinence.  He does endorse back pain, considered acute urinary retention in the setting of spinal cord compression.  MRI Lumbar Spine WWO: IMPRESSION:  1. Stable lumbar spine degeneration from a January MRI with no  lumbar spinal stenosis. Chronic disc degeneration which could be a  source of L5 and S1 radiculitis. And up to moderate chronic  bilateral L5 neural foraminal stenosis.    2. Conus medullaris and cauda  equina nerve roots are stable and  within normal limits. See Thoracic MRI today reported separately.    3. Abnormal pancreas. See details on CT Abdomen and Pelvis  yesterday.    MR Thoracic Spine WWO: IMPRESSION:  1. Renal versus Artifactual thoracic spinal cord T2 signal  heterogeneity at the levels T5 through T10.  No associated cord expansion, edema, or enhancement. Subsequently,  difficult to exclude chronic thoracic demyelination or transverse  myelitis.  2. Capacious thoracic spinal canal with minimal for age thoracic  spine degeneration. No thoracic spinal stenosis or neural  impingement.  3. Partially visible chronically advanced cervical spine  degeneration, similar to a 2021 CT. Suspected degenerative spinal  stenosis at C3-C4.   I spoke with Dr. Derry Lory of neurology who reviewed the patient's MRI imaging of the spine.  Low concern for demyelinating syndrome at this time after his review.  No indication for  LP or any further diagnostic testing.  Patient with AKI in the setting of acute urinary retention.  Recommended admission for observation and recheck of the patient's renal function.  Hospitalist medicine consulted for admission, Dr. Joneen Roach accepting.   Final Clinical Impression(s) / ED Diagnoses Final diagnoses:  Generalized abdominal pain  AKI (acute kidney injury) (HCC)  Acute urinary retention  Acute midline thoracic back pain  Weakness  Lumbosacral radiculopathy    Rx / DC Orders ED Discharge Orders     None         Ernie Avena, MD 02/28/23 [phone]

## 2023-02-28 ENCOUNTER — Emergency Department (HOSPITAL_COMMUNITY): Payer: MEDICAID

## 2023-02-28 DIAGNOSIS — N179 Acute kidney failure, unspecified: Principal | ICD-10-CM

## 2023-02-28 LAB — CBC
HCT: 34.8 % — ABNORMAL LOW (ref 39.0–52.0)
Hemoglobin: 10.6 g/dL — ABNORMAL LOW (ref 13.0–17.0)
MCH: 26.2 pg (ref 26.0–34.0)
MCHC: 30.5 g/dL (ref 30.0–36.0)
MCV: 86.1 fL (ref 80.0–100.0)
Platelets: 172 10*3/uL (ref 150–400)
RBC: 4.04 MIL/uL — ABNORMAL LOW (ref 4.22–5.81)
RDW: 20 % — ABNORMAL HIGH (ref 11.5–15.5)
WBC: 7.4 10*3/uL (ref 4.0–10.5)
nRBC: 0 % (ref 0.0–0.2)

## 2023-02-28 LAB — URINALYSIS, W/ REFLEX TO CULTURE (INFECTION SUSPECTED)
Bacteria, UA: NONE SEEN
Bilirubin Urine: NEGATIVE
Glucose, UA: NEGATIVE mg/dL
Hgb urine dipstick: NEGATIVE
Ketones, ur: NEGATIVE mg/dL
Leukocytes,Ua: NEGATIVE
Nitrite: NEGATIVE
Protein, ur: NEGATIVE mg/dL
Specific Gravity, Urine: 1.011 (ref 1.005–1.030)
pH: 5 (ref 5.0–8.0)

## 2023-02-28 LAB — BASIC METABOLIC PANEL
Anion gap: 9 (ref 5–15)
BUN: 16 mg/dL (ref 6–20)
CO2: 22 mmol/L (ref 22–32)
Calcium: 8.9 mg/dL (ref 8.9–10.3)
Chloride: 104 mmol/L (ref 98–111)
Creatinine, Ser: 1.25 mg/dL — ABNORMAL HIGH (ref 0.61–1.24)
GFR, Estimated: >60 mL/min (ref >60)
Glucose, Bld: 94 mg/dL (ref 70–99)
Potassium: 4.4 mmol/L (ref 3.5–5.1)
Sodium: 135 mmol/L (ref 135–145)

## 2023-02-28 LAB — RAPID URINE DRUG SCREEN, HOSP PERFORMED
Amphetamines: POSITIVE — AB
Barbiturates: NOT DETECTED
Benzodiazepines: NOT DETECTED
Cocaine: POSITIVE — AB
Opiates: NOT DETECTED
Tetrahydrocannabinol: NOT DETECTED

## 2023-02-28 LAB — C-REACTIVE PROTEIN: CRP: <0.5 mg/dL (ref <1.0)

## 2023-02-28 LAB — TROPONIN I (HIGH SENSITIVITY)
Troponin I (High Sensitivity): 3 ng/L (ref <18)
Troponin I (High Sensitivity): 3 ng/L (ref <18)

## 2023-02-28 LAB — RESP PANEL BY RT-PCR (RSV, FLU A&B, COVID)  RVPGX2
Influenza A by PCR: NEGATIVE
Influenza B by PCR: NEGATIVE
Resp Syncytial Virus by PCR: NEGATIVE
SARS Coronavirus 2 by RT PCR: NEGATIVE

## 2023-02-28 LAB — SEDIMENTATION RATE: Sed Rate: 10 mm/h (ref 0–16)

## 2023-02-28 MED ORDER — ONDANSETRON HCL 4 MG/2ML IJ SOLN
4.0000 mg | Freq: Four times a day (QID) | INTRAMUSCULAR | Status: DC | PRN
  Administered 2023-03-03 – 2023-03-04 (×3): 4 mg via INTRAVENOUS
  Filled 2023-02-28 (×3): qty 2

## 2023-02-28 MED ORDER — FENTANYL CITRATE PF 50 MCG/ML IJ SOSY
50.0000 ug | PREFILLED_SYRINGE | Freq: Once | INTRAMUSCULAR | Status: AC
  Administered 2023-02-28: 50 ug via INTRAVENOUS
  Filled 2023-02-28: qty 1

## 2023-02-28 MED ORDER — SUCRALFATE 1 GM/10ML PO SUSP
1.0000 g | Freq: Three times a day (TID) | ORAL | Status: DC
  Administered 2023-02-28 – 2023-03-07 (×19): 1 g via ORAL
  Filled 2023-02-28 (×21): qty 10

## 2023-02-28 MED ORDER — ONDANSETRON HCL 4 MG PO TABS: 4.0000 mg | ORAL_TABLET | Freq: Four times a day (QID) | ORAL | Status: DC | PRN

## 2023-02-28 MED ORDER — METOPROLOL SUCCINATE ER 100 MG PO TB24
200.0000 mg | ORAL_TABLET | Freq: Every day | ORAL | Status: DC
  Administered 2023-02-28 – 2023-03-07 (×8): 200 mg via ORAL
  Filled 2023-02-28 (×5): qty 2
  Filled 2023-02-28: qty 4
  Filled 2023-02-28 (×2): qty 2

## 2023-02-28 MED ORDER — PANTOPRAZOLE SODIUM 40 MG IV SOLR
40.0000 mg | Freq: Once | INTRAVENOUS | Status: AC
  Administered 2023-02-28: 40 mg via INTRAVENOUS
  Filled 2023-02-28: qty 10

## 2023-02-28 MED ORDER — GADOBUTROL 1 MMOL/ML IV SOLN
5.5000 mL | Freq: Once | INTRAVENOUS | Status: AC | PRN
  Administered 2023-02-28: 5.5 mL via INTRAVENOUS

## 2023-02-28 MED ORDER — FENTANYL CITRATE PF 50 MCG/ML IJ SOSY
50.0000 ug | PREFILLED_SYRINGE | INTRAMUSCULAR | Status: DC | PRN
  Administered 2023-02-28 – 2023-03-01 (×7): 50 ug via INTRAVENOUS
  Filled 2023-02-28 (×8): qty 1

## 2023-02-28 MED ORDER — HYDRALAZINE HCL 20 MG/ML IJ SOLN
20.0000 mg | INTRAMUSCULAR | Status: DC | PRN
  Administered 2023-02-28 – 2023-03-02 (×3): 20 mg via INTRAVENOUS
  Filled 2023-02-28 (×3): qty 1

## 2023-02-28 MED ORDER — PANCRELIPASE (LIP-PROT-AMYL) 12000-38000 UNITS PO CPEP
72000.0000 [IU] | ORAL_CAPSULE | Freq: Three times a day (TID) | ORAL | Status: DC
  Administered 2023-02-28 – 2023-03-07 (×20): 72000 [IU] via ORAL
  Filled 2023-02-28 (×3): qty 6
  Filled 2023-02-28: qty 2
  Filled 2023-02-28 (×18): qty 6

## 2023-02-28 MED ORDER — PREGABALIN 50 MG PO CAPS
100.0000 mg | ORAL_CAPSULE | Freq: Two times a day (BID) | ORAL | Status: DC
  Administered 2023-02-28 – 2023-03-07 (×15): 100 mg via ORAL
  Filled 2023-02-28 (×15): qty 2

## 2023-02-28 MED ORDER — LORAZEPAM 2 MG/ML IJ SOLN
1.0000 mg | INTRAMUSCULAR | Status: AC | PRN
  Administered 2023-02-28: 1 mg via INTRAVENOUS
  Filled 2023-02-28: qty 1

## 2023-02-28 MED ORDER — PANCRELIPASE (LIP-PROT-AMYL) 12000-38000 UNITS PO CPEP: 36000.0000 [IU] | ORAL_CAPSULE | ORAL | Status: DC | PRN

## 2023-02-28 MED ORDER — ONDANSETRON HCL 4 MG/2ML IJ SOLN
4.0000 mg | Freq: Once | INTRAMUSCULAR | Status: AC
  Administered 2023-02-28: 4 mg via INTRAVENOUS
  Filled 2023-02-28: qty 2

## 2023-02-28 MED ORDER — PANTOPRAZOLE SODIUM 40 MG PO TBEC
40.0000 mg | DELAYED_RELEASE_TABLET | Freq: Two times a day (BID) | ORAL | Status: DC
  Administered 2023-02-28 – 2023-03-07 (×15): 40 mg via ORAL
  Filled 2023-02-28 (×15): qty 1

## 2023-02-28 MED ORDER — LACTATED RINGERS IV SOLN: INTRAVENOUS | Status: DC

## 2023-02-28 MED ORDER — ACETAMINOPHEN 325 MG PO TABS
650.0000 mg | ORAL_TABLET | Freq: Four times a day (QID) | ORAL | Status: DC | PRN
  Administered 2023-02-28 – 2023-03-05 (×8): 650 mg via ORAL
  Filled 2023-02-28 (×9): qty 2

## 2023-02-28 MED ORDER — ACETAMINOPHEN 650 MG RE SUPP: 650.0000 mg | Freq: Four times a day (QID) | RECTAL | Status: DC | PRN

## 2023-02-28 MED ORDER — FAMOTIDINE 20 MG PO TABS
20.0000 mg | ORAL_TABLET | Freq: Two times a day (BID) | ORAL | Status: DC
  Administered 2023-02-28 – 2023-03-07 (×15): 20 mg via ORAL
  Filled 2023-02-28 (×15): qty 1

## 2023-02-28 MED ORDER — ALPRAZOLAM 0.5 MG PO TABS
0.5000 mg | ORAL_TABLET | Freq: Once | ORAL | Status: AC
  Administered 2023-02-28: 0.5 mg via ORAL
  Filled 2023-02-28: qty 1

## 2023-02-28 NOTE — Progress Notes (Signed)
The patient arrived to the unit. Telemetry applied and verified. Bed alarm set to assist with safety precautions. Patient was witnessed in the hallway walking and gait is unsteady. Discussed importance of foley cathter and safety measures related to proper placement to allow for patency/drainage.  The patient is not agreeable to safety measures put in place. Pt states that he may leave.

## 2023-02-28 NOTE — ED Notes (Signed)
ED TO INPATIENT HANDOFF REPORT  ED Nurse Name and Phone #: Suann Larry Name/Age/Gender [name] 52 y.o. male Room/Bed: WA15/WA15  Code Status   Code Status: Full Code  Home/SNF/Other Home Patient oriented to: self, place, time, and situation Is this baseline? Yes   Triage Complete: Triage complete  Chief Complaint AKI (acute kidney injury) (HCC) [N17.9]  Triage Note Pt arrived reporting abdominal pain a few days. Denies any other symptoms. Pt boisterous and rude with staff. Patient escorted to lobby to wait for provider eval   Allergies Allergies  Allergen Reactions   Robaxin [Methocarbamol] Other (See Comments)    jumpy limbs   Aspirin Other (See Comments)    Unknown reaction   Shellfish-Derived Products Nausea And Vomiting and Rash   Trazodone And Nefazodone Other (See Comments)    Muscle spasms   Adhesive [Tape] Itching   Latex Itching   Toradol [Ketorolac Tromethamine] Other (See Comments)    Has ulcers; cannot have this   Contrast Media [Iodinated Contrast Media] Hives   Reglan [Metoclopramide] Other (See Comments)    Muscle spasms   Salmon [Fish Oil] Nausea And Vomiting and Rash    Level of Care/Admitting Diagnosis ED Disposition     ED Disposition  Admit   Condition  --   Comment  Hospital Area: [facility]  HOSPITAL [[id number]]  Level of Care: Telemetry [5]  Admit to tele based on following criteria: Complex arrhythmia (Bradycardia/Tachycardia)  May place patient in observation at [facility] or [facility] if equivalent level of care is available:: No  Covid Evaluation: Asymptomatic - no recent exposure (last 10 days) testing not required  Diagnosis: AKI (acute kidney injury) [facility]) [[id number]]  Admitting Physician: [name] [[id number]]  Attending Physician: [name] [[id number]]          B Medical/Surgery History Past Medical History:  Diagnosis Date   Alcoholism /alcohol abuse    Anemia    Anxiety     Arthritis    knees; arms; elbows (03/26/2015)   Asthma    Bipolar disorder (HCC)    Chronic bronchitis (HCC)    Chronic lower back pain    Chronic pancreatitis (HCC)    Cocaine abuse (HCC)    Depression    Family history of adverse reaction to anesthesia    Femoral condyle fracture (HCC) 03/08/2014   left medial/notes 03/09/2014   GERD (gastroesophageal reflux disease)    H/O hiatal hernia    H/O suicide attempt 10/2012   High cholesterol    History of blood transfusion 10/2012   when I tried to commit suicide   History of stomach ulcers    Hypertension    Marijuana abuse, continuous    Migraine    a few times/year (03/26/2015)   Pancreatitis    Pneumonia 1990's X 3   PTSD (post-traumatic stress disorder)    Seizures (HCC)    Sickle cell trait (HCC)    WPW (Wolff-Parkinson-White syndrome)    [name] 03/06/2013   Past Surgical History:  Procedure Laterality Date   BIOPSY  11/25/2017   Procedure: BIOPSY;  Surgeon: [name] Modena, MD;  Location: [facility] ENDOSCOPY;  Service: Endoscopy;;   BIOPSY  10/14/2018   Procedure: BIOPSY;  Surgeon: [name] Modena, MD;  Location: [facility] ENDOSCOPY;  Service: Endoscopy;;   CARDIAC CATHETERIZATION     CYST ENTEROSTOMY  01/02/2020   Procedure: CYST ASPIRATION;  Surgeon: Rachael Fee, MD;  Location: [facility] ENDOSCOPY;  Service: Endoscopy;;   ESOPHAGOGASTRODUODENOSCOPY (EGD)  WITH PROPOFOL N/A 11/25/2017   Procedure: ESOPHAGOGASTRODUODENOSCOPY (EGD) WITH PROPOFOL;  Surgeon: [name] Modena, MD;  Location: [facility] ENDOSCOPY;  Service: Endoscopy;  Laterality: N/A;   ESOPHAGOGASTRODUODENOSCOPY (EGD) WITH PROPOFOL Left 10/14/2018   Procedure: ESOPHAGOGASTRODUODENOSCOPY (EGD) WITH PROPOFOL;  Surgeon: [name] Modena, MD;  Location: [facility] ENDOSCOPY;  Service: Endoscopy;  Laterality: Left;   ESOPHAGOGASTRODUODENOSCOPY (EGD) WITH PROPOFOL N/A 11/14/2018   Procedure: ESOPHAGOGASTRODUODENOSCOPY (EGD) WITH PROPOFOL;  Surgeon: Carman Ching, MD;  Location: [facility] ENDOSCOPY;  Service:  Gastroenterology;  Laterality: N/A;   ESOPHAGOGASTRODUODENOSCOPY (EGD) WITH PROPOFOL N/A 01/02/2020   Procedure: ESOPHAGOGASTRODUODENOSCOPY (EGD) WITH PROPOFOL;  Surgeon: Rachael Fee, MD;  Location: [facility] ENDOSCOPY;  Service: Endoscopy;  Laterality: N/A;   ESOPHAGOGASTRODUODENOSCOPY (EGD) WITH PROPOFOL N/A 10/25/2020   Procedure: ESOPHAGOGASTRODUODENOSCOPY (EGD) WITH PROPOFOL;  Surgeon: Meridee Score Netty Starring., MD;  Location: [facility] ENDOSCOPY;  Service: Gastroenterology;  Laterality: N/A;   EUS N/A 01/02/2020   Procedure: UPPER ENDOSCOPIC ULTRASOUND (EUS) RADIAL;  Surgeon: Rachael Fee, MD;  Location: [facility] ENDOSCOPY;  Service: Endoscopy;  Laterality: N/A;   EYE SURGERY Left 1990's   "result of trauma"    FACIAL FRACTURE SURGERY Left 1990's   "result of trauma"    FLEXIBLE SIGMOIDOSCOPY N/A 10/25/2020   Procedure: FLEXIBLE SIGMOIDOSCOPY;  Surgeon: Lemar Lofty., MD;  Location: [facility] ENDOSCOPY;  Service: Gastroenterology;  Laterality: N/A;   FRACTURE SURGERY     HEMOSTASIS CLIP PLACEMENT  10/25/2020   Procedure: HEMOSTASIS CLIP PLACEMENT;  Surgeon: Lemar Lofty., MD;  Location: [facility] ENDOSCOPY;  Service: Gastroenterology;;   HERNIA REPAIR     LEFT HEART CATHETERIZATION WITH CORONARY ANGIOGRAM Right 03/07/2013   Procedure: LEFT HEART CATHETERIZATION WITH CORONARY ANGIOGRAM;  Surgeon: Ricki Rodriguez, MD;  Location: [facility] CATH LAB;  Service: Cardiovascular;  Laterality: Right;   UMBILICAL HERNIA REPAIR     UPPER GASTROINTESTINAL ENDOSCOPY       A IV Location/Drains/Wounds Patient Lines/Drains/Airways Status     Active Line/Drains/Airways     Name Placement date Placement time Site Days   Peripheral IV 02/27/23 20 G 1" Left;Posterior;Proximal Forearm 02/27/23  2331  Forearm  1   Urethral Catheter Bethany, RN Coude 16 Fr. 02/28/23  0302  Coude  less than 1            Intake/Output Last 24 hours  Intake/Output Summary (Last 24 hours) at 02/28/2023 1658 Last data filed at  02/28/2023 0256 Gross per 24 hour  Intake 1002.89 ml  Output --  Net 1002.89 ml    Labs/Imaging Results for orders placed or performed during the hospital encounter of 02/27/23 (from the past 48 hour(s))  Comprehensive metabolic panel     Status: Abnormal   Collection Time: 02/27/23  9:06 PM  Result Value Ref Range   Sodium 137 135 - 145 mmol/L   Potassium 4.1 3.5 - 5.1 mmol/L   Chloride 106 98 - 111 mmol/L   CO2 20 (L) 22 - 32 mmol/L   Glucose, Bld 82 70 - 99 mg/dL    Comment: Glucose reference range applies only to samples taken after fasting for at least 8 hours.   BUN 19 6 - 20 mg/dL   Creatinine, Ser 1.61 (H) 0.61 - 1.24 mg/dL   Calcium 9.7 8.9 - 09.6 mg/dL   Total Protein 8.4 (H) 6.5 - 8.1 g/dL   Albumin 4.5 3.5 - 5.0 g/dL   AST 35 15 - 41 U/L   ALT 23 0 - 44 U/L   Alkaline Phosphatase 92 38 - 126 U/L  Total Bilirubin 0.7 0.3 - 1.2 mg/dL   GFR, Estimated 50 (L) >60 mL/min    Comment: (NOTE) Calculated using the CKD-EPI Creatinine Equation (2021)    Anion gap 11 5 - 15    Comment: Performed at [facility], [address] [place], [place] [address]  Lipase, blood     Status: None   Collection Time: 02/27/23  9:06 PM  Result Value Ref Range   Lipase 21 11 - 51 U/L    Comment: Performed at [facility], [address] [place], [place] [address]  CBC with Diff     Status: Abnormal   Collection Time: 02/27/23  9:06 PM  Result Value Ref Range   WBC 6.5 4.0 - 10.5 K/uL   RBC 3.66 (L) 4.22 - 5.81 MIL/uL   Hemoglobin 9.6 (L) 13.0 - 17.0 g/dL   HCT 44.0 (L) 10.2 - 72.5 %   MCV 84.7 80.0 - 100.0 fL   MCH 26.2 26.0 - 34.0 pg   MCHC 31.0 30.0 - 36.0 g/dL   RDW 36.6 (H) 44.0 - 34.7 %   Platelets 164 150 - 400 K/uL   nRBC 0.0 0.0 - 0.2 %   Neutrophils Relative % 50 %   Neutro Abs 3.3 1.7 - 7.7 K/uL   Lymphocytes Relative 37 %   Lymphs Abs 2.4 0.7 - 4.0 K/uL   Monocytes Relative 10 %   Monocytes Absolute 0.6 0.1 - 1.0 K/uL    Eosinophils Relative 2 %   Eosinophils Absolute 0.2 0.0 - 0.5 K/uL   Basophils Relative 1 %   Basophils Absolute 0.0 0.0 - 0.1 K/uL   Immature Granulocytes 0 %   Abs Immature Granulocytes 0.02 0.00 - 0.07 K/uL    Comment: Performed at [facility], [address], [place], [place] [address]  Troponin I (High Sensitivity)     Status: None   Collection Time: 02/27/23  9:06 PM  Result Value Ref Range   Troponin I (High Sensitivity) 3 <18 ng/L    Comment: (NOTE) Elevated high sensitivity troponin I (hsTnI) values and significant  changes across serial measurements may suggest ACS but many other  chronic and acute conditions are known to elevate hsTnI results.  Refer to the "Links" section for chest pain algorithms and additional  guidance. Performed at [facility], [address] [place], [place] [address]   Sedimentation rate     Status: None   Collection Time: 02/27/23  9:06 PM  Result Value Ref Range   Sed Rate 10 0 - 16 mm/hr    Comment: Performed at [facility], [address] [place], [place] [address]  I-stat chem 8, ED (not at [facility], [facility] or [facility])     Status: Abnormal   Collection Time: 02/27/23  9:20 PM  Result Value Ref Range   Sodium 140 135 - 145 mmol/L   Potassium 4.3 3.5 - 5.1 mmol/L   Chloride 110 98 - 111 mmol/L   BUN 19 6 - 20 mg/dL   Creatinine, Ser 9.51 (H) 0.61 - 1.24 mg/dL   Glucose, Bld 82 70 - 99 mg/dL    Comment: Glucose reference range applies only to samples taken after fasting for at least 8 hours.   Calcium, Ion 1.26 1.15 - 1.40 mmol/L   TCO2 21 (L) 22 - 32 mmol/L   Hemoglobin 12.9 (L) 13.0 - 17.0 g/dL   HCT 88.4 (L) 16.6 - 06.3 %  Resp  panel by RT-PCR (RSV, Flu A&B, Covid) Anterior Nasal Swab     Status: None   Collection Time: 02/28/23 12:34 AM   Specimen: Anterior Nasal Swab  Result Value Ref Range   SARS Coronavirus 2 by RT PCR NEGATIVE NEGATIVE    Comment: (NOTE) SARS-CoV-2 target  nucleic acids are NOT DETECTED.  The SARS-CoV-2 RNA is generally detectable in upper respiratory specimens during the acute phase of infection. The lowest concentration of SARS-CoV-2 viral copies this assay can detect is 138 copies/mL. A negative result does not preclude SARS-Cov-2 infection and should not be used as the sole basis for treatment or other patient management decisions. A negative result may occur with  improper specimen collection/handling, submission of specimen other than nasopharyngeal swab, presence of viral mutation(s) within the areas targeted by this assay, and inadequate number of viral copies(<138 copies/mL). A negative result must be combined with clinical observations, patient history, and epidemiological information. The expected result is Negative.  Fact Sheet for Patients:  BloggerCourse.com  Fact Sheet for Healthcare Providers:  SeriousBroker.it  This test is no t yet approved or cleared by the [place] FDA and  has been authorized for detection and/or diagnosis of SARS-CoV-2 by FDA under an Emergency Use Authorization (EUA). This EUA will remain  in effect (meaning this test can be used) for the duration of the COVID-19 declaration under Section 564(b)(1) of the Act, 21 U.S.C.section 360bbb-3(b)(1), unless the authorization is terminated  or revoked sooner.       Influenza A by PCR NEGATIVE NEGATIVE   Influenza B by PCR NEGATIVE NEGATIVE    Comment: (NOTE) The Xpert Xpress SARS-CoV-2/FLU/RSV plus assay is intended as an aid in the diagnosis of influenza from Nasopharyngeal swab specimens and should not be used as a sole basis for treatment. Nasal washings and aspirates are unacceptable for Xpert Xpress SARS-CoV-2/FLU/RSV testing.  Fact Sheet for Patients: BloggerCourse.com  Fact Sheet for Healthcare Providers: SeriousBroker.it  This test  is not yet approved or cleared by the [place] FDA and has been authorized for detection and/or diagnosis of SARS-CoV-2 by FDA under an Emergency Use Authorization (EUA). This EUA will remain in effect (meaning this test can be used) for the duration of the COVID-19 declaration under Section 564(b)(1) of the Act, 21 U.S.C. section 360bbb-3(b)(1), unless the authorization is terminated or revoked.     Resp Syncytial Virus by PCR NEGATIVE NEGATIVE    Comment: (NOTE) Fact Sheet for Patients: BloggerCourse.com  Fact Sheet for Healthcare Providers: SeriousBroker.it  This test is not yet approved or cleared by the [place] FDA and has been authorized for detection and/or diagnosis of SARS-CoV-2 by FDA under an Emergency Use Authorization (EUA). This EUA will remain in effect (meaning this test can be used) for the duration of the COVID-19 declaration under Section 564(b)(1) of the Act, 21 U.S.C. section 360bbb-3(b)(1), unless the authorization is terminated or revoked.  Performed at [facility], [address] [place], [place] [address]   Urinalysis, w/ Reflex to Culture (Infection Suspected) -Urine, Clean Catch     Status: None   Collection Time: 02/28/23  1:21 AM  Result Value Ref Range   Specimen Source URINE, CLEAN CATCH    Color, Urine YELLOW YELLOW   APPearance CLEAR CLEAR   Specific Gravity, Urine 1.011 1.005 - 1.030   pH 5.0 5.0 - 8.0   Glucose, UA NEGATIVE NEGATIVE mg/dL   Hgb urine dipstick NEGATIVE NEGATIVE   Bilirubin Urine NEGATIVE NEGATIVE  Ketones, ur NEGATIVE NEGATIVE mg/dL   Protein, ur NEGATIVE NEGATIVE mg/dL   Nitrite NEGATIVE NEGATIVE   Leukocytes,Ua NEGATIVE NEGATIVE   RBC / HPF 0-5 0 - 5 RBC/hpf   WBC, UA 0-5 0 - 5 WBC/hpf    Comment:        Reflex urine culture not performed if WBC <=10, OR if Squamous epithelial cells >5. If Squamous epithelial cells >5 suggest  recollection.    Bacteria, UA NONE SEEN NONE SEEN   Squamous Epithelial / HPF 0-5 0 - 5 /HPF   Mucus PRESENT    Hyaline Casts, UA PRESENT     Comment: Performed at [facility], [address], [place], [place] [address]  Rapid urine drug screen (hospital performed)     Status: Abnormal   Collection Time: 02/28/23  1:21 AM  Result Value Ref Range   Opiates NONE DETECTED NONE DETECTED   Cocaine POSITIVE (A) NONE DETECTED   Benzodiazepines NONE DETECTED NONE DETECTED   Amphetamines POSITIVE (A) NONE DETECTED   Tetrahydrocannabinol NONE DETECTED NONE DETECTED   Barbiturates NONE DETECTED NONE DETECTED    Comment: (NOTE) DRUG SCREEN FOR MEDICAL PURPOSES ONLY.  IF CONFIRMATION IS NEEDED FOR ANY PURPOSE, NOTIFY LAB WITHIN 5 DAYS.  LOWEST DETECTABLE LIMITS FOR URINE DRUG SCREEN Drug Class                     Cutoff (ng/mL) Amphetamine and metabolites    1000 Barbiturate and metabolites    200 Benzodiazepine                 200 Opiates and metabolites        300 Cocaine and metabolites        300 THC                            50 Performed at [facility], [address] [place], [place] [address]   C-reactive protein     Status: None   Collection Time: 02/28/23  1:39 AM  Result Value Ref Range   CRP <0.5 <1.0 mg/dL    Comment: Performed at [facility], [address], [place], [place] [address]  Troponin I (High Sensitivity)     Status: None   Collection Time: 02/28/23  1:39 AM  Result Value Ref Range   Troponin I (High Sensitivity) 3 <18 ng/L    Comment: (NOTE) Elevated high sensitivity troponin I (hsTnI) values and significant  changes across serial measurements may suggest ACS but many other  chronic and acute conditions are known to elevate hsTnI results.  Refer to the "Links" section for chest pain algorithms and additional  guidance. Performed at [facility], [address], [place], [place] [address]    CBC     Status: Abnormal   Collection Time: 02/28/23 11:10 AM  Result Value Ref Range   WBC 7.4 4.0 - 10.5 K/uL   RBC 4.04 (L) 4.22 - 5.81 MIL/uL   Hemoglobin 10.6 (L) 13.0 - 17.0 g/dL   HCT 57.8 (L) 46.9 - 62.9 %   MCV 86.1 80.0 - 100.0 fL   MCH 26.2 26.0 - 34.0 pg   MCHC 30.5 30.0 - 36.0 g/dL   RDW 52.8 (H) 41.3 - 24.4 %   Platelets 172 150 - 400 K/uL   nRBC 0.0 0.0 - 0.2 %    Comment: Performed at [facility], [address] [place], [place] [address]  Basic metabolic panel     Status: Abnormal   Collection Time: 02/28/23 11:10 AM  Result Value Ref Range   Sodium 135 135 - 145 mmol/L   Potassium 4.4 3.5 - 5.1 mmol/L   Chloride 104 98 - 111 mmol/L   CO2 22 22 - 32 mmol/L   Glucose, Bld 94 70 - 99 mg/dL    Comment: Glucose reference range applies only to samples taken after fasting for at least 8 hours.   BUN 16 6 - 20 mg/dL   Creatinine, Ser 2.37 (H) 0.61 - 1.24 mg/dL   Calcium 8.9 8.9 - 62.8 mg/dL   GFR, Estimated >31 >51 mL/min    Comment: (NOTE) Calculated using the CKD-EPI Creatinine Equation (2021)    Anion gap 9 5 - 15    Comment: Performed at [facility], [address], [place], [place] [address]   MR Lumbar Spine W Wo Contrast  Result Date: 02/28/2023 CLINICAL DATA:  52 year old male with flank and back pain, urinary retention, left lower extremity weakness. Acute myelopathy. EXAM: MRI LUMBAR SPINE WITHOUT AND WITH CONTRAST TECHNIQUE: Multiplanar and multiecho pulse sequences of the lumbar spine were obtained without and with intravenous contrast. CONTRAST:  5.76mL GADAVIST GADOBUTROL 1 MMOL/ML IV SOLN in conjunction with contrast enhanced imaging of the thoracic spine reported separately. COMPARISON:  Lumbar MRI 05/21/2022. Thoracic MRI today reported separately. CT Abdomen and Pelvis. Yesterday FINDINGS: Segmentation: Normal, concordant with the thoracic numbering today and also the previous lumbar MRI in January. Alignment:  Stable straightening of lumbar lordosis, with subtle degenerative appearing retrolisthesis of L3 on L4 through L5-S1. No significant scoliosis. Vertebrae: Normal background bone marrow signal. Mild acute on chronic L5-S1 endplate degeneration including far lateral mild endplate marrow edema which is greater on the right. See additional details of that level below. No other acute osseous abnormality identified. Intact visible sacrum and SI joints. Conus medullaris and cauda equina: Conus extends to the T12-L1 level. No convincing lower spinal cord or conus signal abnormality. No abnormal intradural enhancement. Cauda equina nerve roots are within normal limits. No dural thickening. Paraspinal and other soft tissues: Stable to the CT Abdomen and Pelvis yesterday, including abnormal pancreas with partially visible cystic masses in the tail. Negative visualized posterior paraspinal soft tissues. Disc levels: T11-T12 and T12-L1:  Negative. L1-L2: Mild disc space loss and disc bulging without stenosis, stable. L2-L3: Mild circumferential disc bulge, facet and ligament flavum hypertrophy without stenosis is stable. L3-L4: Subtle retrolisthesis. Circumferential disc bulge with small posterior annular fissure (series 4, image 23). Mild facet and ligament flavum hypertrophy. No spinal or lateral recess stenosis. Mild L3 foraminal stenosis on the left is stable. L4-L5: Chronic disc bulging with broad-based posterior central disc protrusion and annular fissure (series 4, image 30). Ligament flavum and mild facet hypertrophy. Disc effaces the descending L5 nerve roots in the lateral recesses, without spinal stenosis. No foraminal stenosis. L5-S1: Moderate to severe disc space loss and evidence of vacuum disc. Circumferential disc osteophyte complex with a left paracentral posterior component (series 4, image 35). Chronically effaced descending left S1 nerve roots in the lateral recesses although no convincing lateral recess  stenosis. No spinal stenosis. Mild to moderate bilateral L5 foraminal stenosis appears stable and greater on the right. IMPRESSION: 1. Stable lumbar spine degeneration from a January MRI with no lumbar spinal stenosis. Chronic disc degeneration which could be a source of L5 and S1 radiculitis. And up to  moderate chronic bilateral L5 neural foraminal stenosis. 2. Conus medullaris and cauda equina nerve roots are stable and within normal limits. See Thoracic MRI today reported separately. 3. Abnormal pancreas. See details on CT Abdomen and Pelvis yesterday. Electronically Signed   By: Odessa Fleming M.D.   On: 02/28/2023 05:21   MR THORACIC SPINE W WO CONTRAST  Result Date: 02/28/2023 CLINICAL DATA:  52 year old male with flank and back pain, urinary retention, left lower extremity weakness. Acute myelopathy. EXAM: MRI THORACIC WITHOUT AND WITH CONTRAST TECHNIQUE: Multiplanar and multiecho pulse sequences of the thoracic spine were obtained without and with intravenous contrast. CONTRAST:  5.81mL GADAVIST GADOBUTROL 1 MMOL/ML IV SOLN COMPARISON:  Chest radiographs 02/23/2023.  CTA chest 12/06/2021. Cervical spine CT 03/05/2020. FINDINGS: Limited cervical spine imaging: Age advanced cervical spine degeneration, appearing similar to the 2021 CT. Altered cervical vertebral marrow signal at C4 and C5 corresponds to conspicuous degenerative appearing sclerosis on the prior CT. Mild chronic spondylolisthesis. Evidence of degenerative cervical spinal stenosis at C3-C4. Thoracic spine segmentation:  Normal on the comparison CTA. Alignment: Normal thoracic kyphosis. No thoracic spondylolisthesis. Vertebrae: Normal background thoracic vertebral marrow signal. Maintained vertebral body height. No marrow edema or evidence of acute osseous abnormality. Mild chronic inferior endplate degeneration at T1. Cord: Intermittent mild motion artifact on the axial thoracic images. And on both axial images (especially series 21) and sagittal  T2/stir (series 19, image 10), it is difficult to exclude scattered abnormal increased signal within the thoracic spinal cord. The levels below T10 would be spared. However, there is no cord expansion, and no evidence of abnormal spinal cord or intradural enhancement. No dural thickening. Furthermore, thoracic spinal cord volume seems relatively normal. The conus medullaris appears to remain normal at T12-L1, see also lumbar MRI today reported separately. Paraspinal and other soft tissues: Negative. Disc levels: Capacious thoracic spinal canal little to no age advanced thoracic spine degeneration as follows T1-T2: Disc desiccation, mild disc space loss. Mild disc bulging. No stenosis. T2-T3: Disc desiccation.  Otherwise negative. T7-T8: Minor disc desiccation.  Otherwise negative. IMPRESSION: 1. Renal versus Artifactual thoracic spinal cord T2 signal heterogeneity at the levels T5 through T10. No associated cord expansion, edema, or enhancement. Subsequently, difficult to exclude chronic thoracic demyelination or transverse myelitis. 2. Capacious thoracic spinal canal with minimal for age thoracic spine degeneration. No thoracic spinal stenosis or neural impingement. 3. Partially visible chronically advanced cervical spine degeneration, similar to a 2021 CT. Suspected degenerative spinal stenosis at C3-C4. Electronically Signed   By: Odessa Fleming M.D.   On: 02/28/2023 05:13   CT ABDOMEN PELVIS WO CONTRAST  Result Date: 02/28/2023 CLINICAL DATA:  Left-sided flank pain, history of pancreatitis, initial encounter EXAM: CT ABDOMEN AND PELVIS WITHOUT CONTRAST TECHNIQUE: Multidetector CT imaging of the abdomen and pelvis was performed following the standard protocol without IV contrast. RADIATION DOSE REDUCTION: This exam was performed according to the departmental dose-optimization program which includes automated exposure control, adjustment of the mA and/or kV according to patient size and/or use of iterative  reconstruction technique. COMPARISON:  01/25/2023 FINDINGS: Lower chest: Lung bases are free of acute infiltrate or sizable effusion. Hepatobiliary: No focal liver abnormality is seen. No gallstones, gallbladder wall thickening, or biliary dilatation. Pancreas: Scattered calcifications consistent with chronic pancreatitis are again seen. Cystic lesions are noted in the body and tail of the pancreas. These are also stable from the prior exam. The largest of these measures 3.9 cm in the pancreatic body. Spleen: Normal in size without focal  abnormality. Adrenals/Urinary Tract: Adrenal glands are within normal limits.Kidneys demonstrate a normal appearance without renal calculi. No obstructive changes are seen. The bladder is well distended. Stomach/Bowel: Scattered fecal material is noted throughout the colon without obstructive change. The appendix is within normal limits. Small bowel is unremarkable. Stomach is within normal limits as well. Vascular/Lymphatic: Aortic atherosclerosis. No enlarged abdominal or pelvic lymph nodes. Reproductive: Prostate is unremarkable. Other: No abdominal wall hernia or abnormality. No abdominopelvic ascites. Musculoskeletal: No acute or significant osseous findings. IMPRESSION: Changes of chronic pancreatitis with pseudocyst formation. No acute abnormality is identified to correspond with the given clinical history. Electronically Signed   By: Alcide Clever M.D.   On: 02/28/2023 01:12   US SCROTUM W/DOPPLER  Result Date: 02/27/2023 CLINICAL DATA:  Bilateral testicular pain for 2 days. EXAM: SCROTAL ULTRASOUND DOPPLER ULTRASOUND OF THE TESTICLES TECHNIQUE: Complete ultrasound examination of the testicles, epididymis, and other scrotal structures was performed. Color and spectral Doppler ultrasound were also utilized to evaluate blood flow to the testicles. COMPARISON:  01/25/2023. FINDINGS: Right testicle Measurements: 3.0 x 1.7 x 2.3 cm. Heterogeneous echotexture, microlithiasis  visualized. Left testicle Measurements: 3.4 x 1.8 x 2.3 cm. Heterogeneous echotexture, microlithiasis visualized. Right epididymis:  Normal in size and appearance. Left epididymis: Normal in size. A cyst is noted in the epididymal head measuring 0.4 x 0.3 x 0.4 cm. Hydrocele:  None visualized. Varicocele:  None visualized. Pulsed Doppler interrogation of both testes demonstrates normal low resistance arterial and venous waveforms bilaterally. IMPRESSION: 1. No evidence of testicular torsion. 2. 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 Current literature suggests that testicular microlithiasis is not a significant independent risk factor for development of testicular carcinoma, and that follow up imaging is not warranted in the absence of other risk factors. Monthly testicular self-examination and annual physical exams are considered appropriate surveillance. If patient has other risk factors for testicular carcinoma, then referral to Urology should be considered. (Reference: DeCastro, et al.: A 5-Year Follow up Study of Asymptomatic Men with Testicular Microlithiasis. J Urol 2008; 179:1420-1423.) 3. Left epididymal head cyst. Electronically Signed   By: Thornell Sartorius M.D.   On: 02/27/2023 22:18    Pending Labs Unresulted Labs (From admission, onward)     Start     Ordered   03/01/23 0500  CBC  Tomorrow morning,   R        02/28/23 0919   03/01/23 0500  Basic metabolic panel  Tomorrow morning,   R        02/28/23 0919            Vitals/Pain Today's Vitals   02/28/23 1101 02/28/23 1122 02/28/23 1512 02/28/23 1653  BP:  (!) 147/110 (!) 145/101   Pulse:  67 78   Resp:  16 (!) 22   Temp:  (!) 97.4 F (36.3 C) (!) 97.3 F (36.3 C)   TempSrc:  Oral Oral   SpO2:  100% 100%   PainSc: 7    8     Isolation Precautions No active isolations  Medications Medications  lactated ringers infusion ( Intravenous New Bag/Given 02/28/23 0653)  acetaminophen (TYLENOL)  tablet 650 mg (650 mg Oral Given 02/28/23 1101)    Or  acetaminophen (TYLENOL) suppository 650 mg ( Rectal See Alternative 02/28/23 1101)  ondansetron (ZOFRAN) tablet 4 mg (has no administration in time range)    Or  ondansetron (ZOFRAN) injection 4 mg (has no administration in time range)  fentaNYL (SUBLIMAZE) injection 50 mcg (50 mcg  Intravenous Given 02/28/23 1652)  famotidine (PEPCID) tablet 20 mg (20 mg Oral Given 02/28/23 1338)  pantoprazole (PROTONIX) EC tablet 40 mg (40 mg Oral Given 02/28/23 1649)  sucralfate (CARAFATE) 1 GM/10ML suspension 1 g (1 g Oral Given 02/28/23 1651)  metoprolol succinate (TOPROL-XL) 24 hr tablet 200 mg (200 mg Oral Given 02/28/23 1338)  lipase/protease/amylase (CREON) capsule 72,000 Units (72,000 Units Oral Given 02/28/23 1650)    And  lipase/protease/amylase (CREON) capsule 36,000 Units (has no administration in time range)  pregabalin (LYRICA) capsule 100 mg (100 mg Oral Given 02/28/23 1339)  fentaNYL (SUBLIMAZE) injection 50 mcg (50 mcg Intravenous Given 02/28/23 0012)  ondansetron (ZOFRAN) injection 4 mg (4 mg Intravenous Given 02/28/23 0011)  lactated ringers bolus 1,000 mL (0 mLs Intravenous Stopped 02/28/23 0256)  famotidine (PEPCID) IVPB 20 mg premix (0 mg Intravenous Stopped 02/28/23 0045)  fentaNYL (SUBLIMAZE) injection 50 mcg (50 mcg Intravenous Given 02/28/23 0149)  LORazepam (ATIVAN) injection 1 mg (1 mg Intravenous Given 02/28/23 0352)  fentaNYL (SUBLIMAZE) injection 50 mcg (50 mcg Intravenous Given 02/28/23 0352)  gadobutrol (GADAVIST) 1 MMOL/ML injection 5.5 mL (5.5 mLs Intravenous Contrast Given 02/28/23 0437)  fentaNYL (SUBLIMAZE) injection 50 mcg (50 mcg Intravenous Given 02/28/23 0831)  ondansetron (ZOFRAN) injection 4 mg (4 mg Intravenous Given 02/28/23 0831)  pantoprazole (PROTONIX) injection 40 mg (40 mg Intravenous Given 02/28/23 1610)    Mobility walks with person assist     Focused Assessments Abdominal  Pain   R Recommendations: See Admitting Provider Note  Report given to:   Additional Notes: .

## 2023-02-28 NOTE — ED Notes (Signed)
Attempted foley placement unsuccessfully. Will try with coude per Karene Fry, MD.

## 2023-02-28 NOTE — H&P (Signed)
History and Physical    Patient: [name] ZOX:[id number] DOB: May 07, 1971 DOA: 02/27/2023 DOS: the patient was seen and examined on 02/28/2023 PCP: Default, Provider, MD  Patient coming from: Home  Chief Complaint:  Chief Complaint  Patient presents with   Abdominal Pain   HPI: [name] is a 52 y.o. male with medical history significant of alcoholism, anemia, anxiety, depression, bipolar disorder, history of suicidal attempt, PTSD, osteoarthritis, chronic bronchitis, chronic lower back pain, chronic pancreatitis, cocaine abuse, femoral condyle fracture, GERD, hiatal hernia, hyperlipidemia, hypertension, cannabis use, migraine headaches, history of pneumonia, seizure disorder, sickle cell trait, Wolff-Parkinson-White syndrome who presented to the emergency department complaints of RUQ abdominal pain for the past few days.  Pain feels similar to his pancreatitis.  He is also having burning dysuria with suprapubic discomfort radiating down to his scrotum.  He feels he has been incontinent since last Wednesday.  Left-sided chest discomfort  98.2 F, pulse 77, respirations 16, BP 149/103 mmHg O2 sat 100% on room air.  Patient received famotidine 20 mg IVP, fentanyl 50 mcg IVP x 2, lorazepam 1 mg IVP, ondansetron 4 mg IVP x 2, Protonix 40 mg IVP and 1000 mL of LR bolus.  Lab work: Urinalysis was unremarkable.  UDS was positive for cocaine and amphetamines.  Coronavirus influenza and RSV PCR negative.  CRP and sed rate were normal.  Troponin x 2 unremarkable.  Normal lipase level.  CMP showed a CO2 of 20 mmol/L with a normal anion gap, creatinine of 1.64 mg/dL and total protein 8.4 g/dL.  Imaging: CT abdomen/pelvis without contrast with chronic pancreatitis changes and pseudocyst formation.  60 lesions are stable from prior exam with the largest one measuring 3.9 cm in the pancreatic body.  There was no acute abnormality.  Aortic atherosclerosis.  You have a scrotum ultrasound  with Doppler with no evidence of testicular torsion.  Heterogeneous echotexture of the testicle bilateral with microlithiasis.  Monthly testicular self-examination and annual physical exams recommended.  If any other risk factors for testicular carcinoma urology referral should be considered.  Left epididymal head cyst.  MRI lumbar spine without contrast showing stable lumbar spine degeneration when compared to January MRI with no lumbar spinal stenosis.  Normal pancreas.  Thoracic spine with renal versus artifactual thoracic spinal cord T2 signal heterogeneity at the levels of T5-T10.  No associated correspond [name], edema or enhancement.  Subsequently, difficult exclude chronic thoracic demyelination or transfer myelitis.  Ashes thoracic spinal canal with minimal for age thoracic spine degeneration.  No thoracic spinal stenosis or neural impingement.  Partially visible chronically advanced cervical spine degeneration similar to 2021 CT.  Suspected degenerative spinal stenosis at C3-C4.  Please see images and full radiology report for further details.   Review of Systems: As mentioned in the history of present illness. All other systems reviewed and are negative.  Past Medical History:  Diagnosis Date   Alcoholism /alcohol abuse    Anemia    Anxiety    Arthritis    knees; arms; elbows (03/26/2015)   Asthma    Bipolar disorder (HCC)    Chronic bronchitis (HCC)    Chronic lower back pain    Chronic pancreatitis (HCC)    Cocaine abuse (HCC)    Depression    Family history of adverse reaction to anesthesia    Femoral condyle fracture (HCC) 03/08/2014   left medial/notes 03/09/2014   GERD (gastroesophageal reflux disease)    H/O hiatal hernia    H/O suicide attempt  10/2012   High cholesterol    History of blood transfusion 10/2012   when I tried to commit suicide   History of stomach ulcers    Hypertension    Marijuana abuse, continuous    Migraine    a few times/year (03/26/2015)    Pancreatitis    Pneumonia 1990's X 3   PTSD (post-traumatic stress disorder)    Seizures (HCC)    Sickle cell trait (HCC)    WPW (Wolff-Parkinson-White syndrome)    [name] 03/06/2013   Past Surgical History:  Procedure Laterality Date   BIOPSY  11/25/2017   Procedure: BIOPSY;  Surgeon: [name] Modena, MD;  Location: [facility] ENDOSCOPY;  Service: Endoscopy;;   BIOPSY  10/14/2018   Procedure: BIOPSY;  Surgeon: [name] Modena, MD;  Location: [facility] ENDOSCOPY;  Service: Endoscopy;;   CARDIAC CATHETERIZATION     CYST ENTEROSTOMY  01/02/2020   Procedure: CYST ASPIRATION;  Surgeon: Rachael Fee, MD;  Location: [facility] ENDOSCOPY;  Service: Endoscopy;;   ESOPHAGOGASTRODUODENOSCOPY (EGD) WITH PROPOFOL N/A 11/25/2017   Procedure: ESOPHAGOGASTRODUODENOSCOPY (EGD) WITH PROPOFOL;  Surgeon: [name] Modena, MD;  Location: [facility] ENDOSCOPY;  Service: Endoscopy;  Laterality: N/A;   ESOPHAGOGASTRODUODENOSCOPY (EGD) WITH PROPOFOL Left 10/14/2018   Procedure: ESOPHAGOGASTRODUODENOSCOPY (EGD) WITH PROPOFOL;  Surgeon: [name] Modena, MD;  Location: [facility] ENDOSCOPY;  Service: Endoscopy;  Laterality: Left;   ESOPHAGOGASTRODUODENOSCOPY (EGD) WITH PROPOFOL N/A 11/14/2018   Procedure: ESOPHAGOGASTRODUODENOSCOPY (EGD) WITH PROPOFOL;  Surgeon: Carman Ching, MD;  Location: [facility] ENDOSCOPY;  Service: Gastroenterology;  Laterality: N/A;   ESOPHAGOGASTRODUODENOSCOPY (EGD) WITH PROPOFOL N/A 01/02/2020   Procedure: ESOPHAGOGASTRODUODENOSCOPY (EGD) WITH PROPOFOL;  Surgeon: Rachael Fee, MD;  Location: [facility] ENDOSCOPY;  Service: Endoscopy;  Laterality: N/A;   ESOPHAGOGASTRODUODENOSCOPY (EGD) WITH PROPOFOL N/A 10/25/2020   Procedure: ESOPHAGOGASTRODUODENOSCOPY (EGD) WITH PROPOFOL;  Surgeon: Meridee Score Netty Starring., MD;  Location: [facility] ENDOSCOPY;  Service: Gastroenterology;  Laterality: N/A;   EUS N/A 01/02/2020   Procedure: UPPER ENDOSCOPIC ULTRASOUND (EUS) RADIAL;  Surgeon: Rachael Fee, MD;  Location: [facility] ENDOSCOPY;  Service: Endoscopy;  Laterality:  N/A;   EYE SURGERY Left 1990's   "result of trauma"    FACIAL FRACTURE SURGERY Left 1990's   "result of trauma"    FLEXIBLE SIGMOIDOSCOPY N/A 10/25/2020   Procedure: FLEXIBLE SIGMOIDOSCOPY;  Surgeon: Lemar Lofty., MD;  Location: [facility] ENDOSCOPY;  Service: Gastroenterology;  Laterality: N/A;   FRACTURE SURGERY     HEMOSTASIS CLIP PLACEMENT  10/25/2020   Procedure: HEMOSTASIS CLIP PLACEMENT;  Surgeon: Lemar Lofty., MD;  Location: [facility] ENDOSCOPY;  Service: Gastroenterology;;   HERNIA REPAIR     LEFT HEART CATHETERIZATION WITH CORONARY ANGIOGRAM Right 03/07/2013   Procedure: LEFT HEART CATHETERIZATION WITH CORONARY ANGIOGRAM;  Surgeon: Ricki Rodriguez, MD;  Location: [facility] CATH LAB;  Service: Cardiovascular;  Laterality: Right;   UMBILICAL HERNIA REPAIR     UPPER GASTROINTESTINAL ENDOSCOPY     Social History:  reports that he has been smoking cigarettes and e-cigarettes. He has a 36 pack-year smoking history. He has never used smokeless tobacco. He reports that he does not currently use alcohol after a past usage of about 4.0 standard drinks of alcohol per week. He reports that he does not currently use drugs after having used the following drugs: Marijuana and Cocaine.  Allergies  Allergen Reactions   Robaxin [Methocarbamol] Other (See Comments)    jumpy limbs   Aspirin Other (See Comments)    Unknown reaction   Shellfish-Derived Products Nausea And Vomiting and Rash   Trazodone And Nefazodone  Other (See Comments)    Muscle spasms   Adhesive [Tape] Itching   Latex Itching   Toradol [Ketorolac Tromethamine] Other (See Comments)    Has ulcers; cannot have this   Contrast Media [Iodinated Contrast Media] Hives   Reglan [Metoclopramide] Other (See Comments)    Muscle spasms   Salmon [Fish Oil] Nausea And Vomiting and Rash    Family History  Problem Relation Age of Onset   Hypertension Mother    Cirrhosis Father    Alcoholism Father    Hypertension Father    Melanoma  Father    Hypertension Other    Coronary artery disease Other     Prior to Admission medications   Medication Sig Start Date End Date Taking? Authorizing Provider  pregabalin (LYRICA) 100 MG capsule Take 100 mg by mouth 2 (two) times daily. 02/26/23  Yes [provider]  famotidine (PEPCID) 20 MG tablet Take 20 mg by mouth 2 (two) times daily.    [provider]  fluticasone (FLONASE) 50 MCG/ACT nasal spray Place 2 sprays into both nostrils 2 (two) times daily as needed for allergies.    [provider]  folic acid (FOLVITE) 1 MG tablet Take 1 tablet (1 mg total) by mouth daily. 09/21/22   Pokhrel, Rebekah Chesterfield, MD  lipase/protease/amylase (CREON) 36000 UNITS CPEP capsule Take 2 capsules (72,000 Units total) by mouth 3 (three) times daily with meals. May also take 1 capsule (36,000 Units total) as needed (with snacks). 01/26/23   Jerald Kief, MD  metoprolol (TOPROL-XL) 200 MG 24 hr tablet Take 200 mg by mouth daily. 11/30/22   [provider]  nitroGLYCERIN (NITROSTAT) 0.4 MG SL tablet Place 0.4 mg under the tongue every 5 (five) minutes as needed for chest pain.    [provider]  ondansetron (ZOFRAN-ODT) 4 MG disintegrating tablet Take 1 tablet (4 mg total) by mouth 2 (two) times daily as needed for nausea or vomiting. Patient taking differently: Take 4 mg by mouth as needed for nausea or vomiting. 01/26/23   Jerald Kief, MD  oxyCODONE (ROXICODONE) 5 MG immediate release tablet Take 1 tablet (5 mg total) by mouth every 4 (four) hours as needed for severe pain (pain score 7-10). 02/23/23   Gilda Crease, MD  pantoprazole (PROTONIX) 40 MG tablet Take 1 tablet (40 mg total) by mouth 2 (two) times daily before a meal. 02/11/23 03/13/23  Dahal, Melina Schools, MD  Simethicone 40 MG/0.6ML LIQD Take 0.6 mLs (40 mg total) by mouth in the morning, at noon, and at bedtime for 7 days. Patient taking differently: Take 40 mg by mouth 3 (three) times daily as needed  (for bloating and gas). 02/11/23 02/21/24  Dahal, Melina Schools, MD  sucralfate (CARAFATE) 1 GM/10ML suspension Take 10 mLs (1 g total) by mouth See admin instructions. Take 4 times with meals and at bedtime. Patient taking differently: Take 1 g by mouth See admin instructions. Take 1g by mouth three times a day with meals.  May take an additional 1g with a snack. 01/11/23   Jerald Kief, MD  amitriptyline (ELAVIL) 25 MG tablet Take 1 tablet (25 mg total) by mouth at bedtime. Patient not taking: Reported on 08/08/2019 10/15/18 08/08/19  Rolly Salter, MD    Physical Exam: Vitals:   02/27/23 1951 02/27/23 2330 02/28/23 0233 02/28/23 0630  BP: (!) 153/106 (!) 162/114 (!) 164/102 (!) 141/95  Pulse: (!) 101 79 76 75  Resp: 16 18 18 14   Temp: 97.9 F (  36.6 C) 98.2 F (36.8 C) 98 F (36.7 C) 97.8 F (36.6 C)  TempSrc: Oral   Oral  SpO2: 92% 100% 100% 100%   Physical Exam Vitals and nursing note reviewed.  Constitutional:      Appearance: He is well-developed.  HENT:     Head: Normocephalic.     Nose: No rhinorrhea.     Mouth/Throat:     Mouth: Mucous membranes are moist.  Eyes:     General: No scleral icterus.    Pupils: Pupils are equal, round, and reactive to light.  Cardiovascular:     Rate and Rhythm: Normal rate and regular rhythm.  Pulmonary:     Effort: Pulmonary effort is normal.     Breath sounds: Normal breath sounds.  Abdominal:     Palpations: Abdomen is soft.     Tenderness: There is abdominal tenderness.  Musculoskeletal:     Cervical back: Neck supple.     Right lower leg: No edema.     Left lower leg: No edema.  Skin:    General: Skin is warm and dry.  Neurological:     General: No focal deficit present.     Mental Status: He is alert and oriented to person, place, and time.  Psychiatric:        Mood and Affect: Mood normal.        Behavior: Behavior normal.    Data Reviewed:  Results are pending, will review when available.  Assessment and Plan: Principal  Problem:   AKI (acute kidney injury) (HCC) Observation/telemetry. Continue IV fluids. Hold ARB/ACE. Hold diuretic. Avoid hypotension. Avoid nephrotoxins. Monitor intake and output. Monitor renal function electrolytes.  Active Problems:   Paroxysmal atrial fibrillation (HCC)   WPW (Wolff-Parkinson-White syndrome) Keep electrolytes optimized. Continue metoprolol.    Essential hypertension Continue metoprolol succinate 200 mg p.o. daily. Monitor blood pressure and heart rate.    GERD (gastroesophageal reflux disease) Continue famotidine 20 mg p.o. twice daily.     Advance Care Planning:   Code Status: Full Code   Consults:   Family Communication:   Severity of Illness: The appropriate patient status for this patient is OBSERVATION. Observation status is judged to be reasonable and necessary in order to provide the required intensity of service to ensure the patient's safety. The patient's presenting symptoms, physical exam findings, and initial radiographic and laboratory data in the context of their medical condition is felt to place them at decreased risk for further clinical deterioration. Furthermore, it is anticipated that the patient will be medically stable for discharge from the hospital within 2 midnights of admission.   Author: Bobette Mo, MD 02/28/2023 7:11 AM  For on call review [email or web address].   This document was prepared using Dragon voice recognition software and may contain some unintended transcription errors.

## 2023-02-28 NOTE — ED Notes (Signed)
PT has an outcome of 203 mL from the bladder scan

## 2023-03-01 ENCOUNTER — Encounter (HOSPITAL_COMMUNITY): Payer: Self-pay | Admitting: Internal Medicine

## 2023-03-01 DIAGNOSIS — J4489 Other specified chronic obstructive pulmonary disease: Secondary | ICD-10-CM | POA: Diagnosis present

## 2023-03-01 DIAGNOSIS — F1410 Cocaine abuse, uncomplicated: Secondary | ICD-10-CM | POA: Diagnosis present

## 2023-03-01 DIAGNOSIS — E7800 Pure hypercholesterolemia, unspecified: Secondary | ICD-10-CM | POA: Diagnosis present

## 2023-03-01 DIAGNOSIS — I456 Pre-excitation syndrome: Secondary | ICD-10-CM | POA: Diagnosis not present

## 2023-03-01 DIAGNOSIS — E875 Hyperkalemia: Secondary | ICD-10-CM | POA: Diagnosis present

## 2023-03-01 DIAGNOSIS — Z79899 Other long term (current) drug therapy: Secondary | ICD-10-CM | POA: Diagnosis not present

## 2023-03-01 DIAGNOSIS — K31811 Angiodysplasia of stomach and duodenum with bleeding: Secondary | ICD-10-CM | POA: Diagnosis not present

## 2023-03-01 DIAGNOSIS — Z9151 Personal history of suicidal behavior: Secondary | ICD-10-CM | POA: Diagnosis not present

## 2023-03-01 DIAGNOSIS — Z1152 Encounter for screening for COVID-19: Secondary | ICD-10-CM | POA: Diagnosis not present

## 2023-03-01 DIAGNOSIS — K219 Gastro-esophageal reflux disease without esophagitis: Secondary | ICD-10-CM

## 2023-03-01 DIAGNOSIS — R338 Other retention of urine: Secondary | ICD-10-CM

## 2023-03-01 DIAGNOSIS — K861 Other chronic pancreatitis: Secondary | ICD-10-CM

## 2023-03-01 DIAGNOSIS — K31819 Angiodysplasia of stomach and duodenum without bleeding: Secondary | ICD-10-CM | POA: Diagnosis not present

## 2023-03-01 DIAGNOSIS — I10 Essential (primary) hypertension: Secondary | ICD-10-CM | POA: Diagnosis present

## 2023-03-01 DIAGNOSIS — K862 Cyst of pancreas: Secondary | ICD-10-CM | POA: Diagnosis not present

## 2023-03-01 DIAGNOSIS — K863 Pseudocyst of pancreas: Secondary | ICD-10-CM | POA: Diagnosis present

## 2023-03-01 DIAGNOSIS — F319 Bipolar disorder, unspecified: Secondary | ICD-10-CM | POA: Diagnosis present

## 2023-03-01 DIAGNOSIS — K766 Portal hypertension: Secondary | ICD-10-CM | POA: Diagnosis present

## 2023-03-01 DIAGNOSIS — I4891 Unspecified atrial fibrillation: Secondary | ICD-10-CM | POA: Diagnosis not present

## 2023-03-01 DIAGNOSIS — N179 Acute kidney failure, unspecified: Secondary | ICD-10-CM | POA: Diagnosis present

## 2023-03-01 DIAGNOSIS — G40909 Epilepsy, unspecified, not intractable, without status epilepticus: Secondary | ICD-10-CM | POA: Diagnosis present

## 2023-03-01 DIAGNOSIS — F17210 Nicotine dependence, cigarettes, uncomplicated: Secondary | ICD-10-CM | POA: Diagnosis present

## 2023-03-01 DIAGNOSIS — M5417 Radiculopathy, lumbosacral region: Secondary | ICD-10-CM | POA: Diagnosis present

## 2023-03-01 DIAGNOSIS — Z8249 Family history of ischemic heart disease and other diseases of the circulatory system: Secondary | ICD-10-CM | POA: Diagnosis not present

## 2023-03-01 DIAGNOSIS — K2980 Duodenitis without bleeding: Secondary | ICD-10-CM | POA: Diagnosis present

## 2023-03-01 DIAGNOSIS — I480 Paroxysmal atrial fibrillation: Secondary | ICD-10-CM

## 2023-03-01 DIAGNOSIS — E860 Dehydration: Secondary | ICD-10-CM | POA: Diagnosis present

## 2023-03-01 DIAGNOSIS — G8929 Other chronic pain: Secondary | ICD-10-CM | POA: Diagnosis present

## 2023-03-01 DIAGNOSIS — K860 Alcohol-induced chronic pancreatitis: Secondary | ICD-10-CM | POA: Diagnosis present

## 2023-03-01 DIAGNOSIS — F4310 Post-traumatic stress disorder, unspecified: Secondary | ICD-10-CM | POA: Diagnosis present

## 2023-03-01 DIAGNOSIS — D573 Sickle-cell trait: Secondary | ICD-10-CM | POA: Diagnosis present

## 2023-03-01 LAB — CBC
HCT: 35.9 % — ABNORMAL LOW (ref 39.0–52.0)
Hemoglobin: 10.4 g/dL — ABNORMAL LOW (ref 13.0–17.0)
MCH: 25.8 pg — ABNORMAL LOW (ref 26.0–34.0)
MCHC: 29 g/dL — ABNORMAL LOW (ref 30.0–36.0)
MCV: 89.1 fL (ref 80.0–100.0)
Platelets: 157 10*3/uL (ref 150–400)
RBC: 4.03 MIL/uL — ABNORMAL LOW (ref 4.22–5.81)
RDW: 20.1 % — ABNORMAL HIGH (ref 11.5–15.5)
WBC: 8.4 10*3/uL (ref 4.0–10.5)
nRBC: 0 % (ref 0.0–0.2)

## 2023-03-01 LAB — BASIC METABOLIC PANEL
Anion gap: 9 (ref 5–15)
BUN: 12 mg/dL (ref 6–20)
CO2: 21 mmol/L — ABNORMAL LOW (ref 22–32)
Calcium: 9.6 mg/dL (ref 8.9–10.3)
Chloride: 109 mmol/L (ref 98–111)
Creatinine, Ser: 0.97 mg/dL (ref 0.61–1.24)
GFR, Estimated: >60 mL/min (ref >60)
Glucose, Bld: 112 mg/dL — ABNORMAL HIGH (ref 70–99)
Potassium: 5.3 mmol/L — ABNORMAL HIGH (ref 3.5–5.1)
Sodium: 139 mmol/L (ref 135–145)

## 2023-03-01 LAB — POTASSIUM: Potassium: 4.9 mmol/L (ref 3.5–5.1)

## 2023-03-01 MED ORDER — ZINC OXIDE 40 % EX OINT
TOPICAL_OINTMENT | Freq: Two times a day (BID) | CUTANEOUS | Status: DC
  Filled 2023-03-01: qty 57

## 2023-03-01 MED ORDER — FLUTICASONE PROPIONATE 50 MCG/ACT NA SUSP
2.0000 | Freq: Every day | NASAL | Status: DC
  Administered 2023-03-02 – 2023-03-07 (×6): 2 via NASAL
  Filled 2023-03-01: qty 16

## 2023-03-01 MED ORDER — ENOXAPARIN SODIUM 40 MG/0.4ML IJ SOSY
40.0000 mg | PREFILLED_SYRINGE | Freq: Every day | INTRAMUSCULAR | Status: DC
  Administered 2023-03-01: 40 mg via SUBCUTANEOUS
  Filled 2023-03-01 (×2): qty 0.4

## 2023-03-01 MED ORDER — LOPERAMIDE HCL 2 MG PO CAPS
2.0000 mg | ORAL_CAPSULE | ORAL | Status: DC | PRN
  Administered 2023-03-01 – 2023-03-02 (×4): 2 mg via ORAL
  Filled 2023-03-01 (×4): qty 1

## 2023-03-01 MED ORDER — CHLORHEXIDINE GLUCONATE CLOTH 2 % EX PADS
6.0000 | MEDICATED_PAD | Freq: Every day | CUTANEOUS | Status: DC
  Administered 2023-03-01 – 2023-03-07 (×6): 6 via TOPICAL

## 2023-03-01 MED ORDER — ORAL CARE MOUTH RINSE: 15.0000 mL | OROMUCOSAL | Status: DC | PRN

## 2023-03-01 MED ORDER — HYDROCORTISONE 1 % EX CREA
TOPICAL_CREAM | CUTANEOUS | Status: DC | PRN
  Filled 2023-03-01: qty 28

## 2023-03-01 MED ORDER — DEXTROSE-SODIUM CHLORIDE 5-0.9 % IV SOLN: INTRAVENOUS | Status: DC

## 2023-03-01 MED ORDER — HYDROMORPHONE HCL 1 MG/ML IJ SOLN
1.0000 mg | INTRAMUSCULAR | Status: DC | PRN
  Administered 2023-03-01 – 2023-03-07 (×35): 1 mg via INTRAVENOUS
  Filled 2023-03-01 (×37): qty 1

## 2023-03-01 MED ORDER — ALPRAZOLAM 0.5 MG PO TABS
0.5000 mg | ORAL_TABLET | Freq: Every evening | ORAL | Status: DC | PRN
  Administered 2023-03-01 – 2023-03-06 (×5): 0.5 mg via ORAL
  Filled 2023-03-01 (×5): qty 1

## 2023-03-01 MED ORDER — OXYCODONE HCL 5 MG PO TABS
5.0000 mg | ORAL_TABLET | ORAL | Status: DC | PRN
  Administered 2023-03-01 – 2023-03-07 (×32): 5 mg via ORAL
  Filled 2023-03-01 (×34): qty 1

## 2023-03-01 NOTE — Plan of Care (Signed)
  Problem: Coping: Goal: Level of anxiety will decrease Outcome: Progressing   Problem: Pain Management: Goal: General experience of comfort will improve Outcome: Progressing   Problem: Safety: Goal: Ability to remain free from injury will improve Outcome: Progressing

## 2023-03-01 NOTE — Progress Notes (Signed)
This RN received report about this new pt from day shift nurse. Around 1950, pt complained of bed alarm , which was set by day shift nurse.  Pt requested " turning off the alarm". [name] last SBP was over 200. PRN BP and  PRN  pain medication was given before shift change. This RN explained that " let me check your safety including fall risk". Then pt became upset and yelling in a room and hall.Security called and came. Pt became calm down and understood. This RN checked pt' safety score and turned it off the alarm. Will continue to monitor.

## 2023-03-01 NOTE — Progress Notes (Signed)
PROGRESS NOTE    [name]  ZOX:[id number] DOB: 01-Sep-1970 DOA: 02/27/2023 PCP: Default, Provider, MD    Chief Complaint  Patient presents with   Abdominal Pain    Brief Narrative:  Patient 52 year old gentleman history of chronic alcoholism, polysubstance abuse, anemia, depression/anxiety, bipolar disorder, history of suicide attempt, PTSD, OA, chronic bronchitis, chronic lower back pain, chronic pancreatitis, cocaine abuse, GERD, hiatal hernia, hypertension, WPW, sickle cell trait presented to the ED with complaints of right upper quadrant abdominal pain similar to his pancreatitis pain, some burning with dysuria and suprapubic discomfort down to the scrotum area with some urinary incontinence.  Urinalysis done unremarkable.  UDS positive for cocaine and amphetamines.  CRP sed rate normal.  CT abdomen and pelvis done with changes of chronic pancreatitis with pseudocyst formation.  No acute abnormality identified.  MRI of the L-spine with stable lumbar spine degeneration.  Thoracic spine cord T2 signal heterogenicity at the levels of T5-T10.  No thoracic spinal stenosis or neural impingement.  Patient noted to be in AKI, noted to have some urinary retention, Foley catheter placed, electrolytes optimized and pain managed.  Patient noted to be scheduled for EUS with celiac plexus block on 03/02/2023, per GI patient to undergo this procedure in house tomorrow 03/02/2023 if medically stable as patient with difficulty with outpatient follow-up with appointments.   Assessment & Plan:   Principal Problem:   AKI (acute kidney injury) (HCC) Active Problems:   Paroxysmal atrial fibrillation (HCC)   Essential hypertension   GERD (gastroesophageal reflux disease)   WPW (Wolff-Parkinson-White syndrome)   Acute urinary retention   #1 acute kidney injury/urinary retention -Likely multifactorial secondary to a prerenal azotemia in the setting of dehydration and likelyalsopostrenal as  patient with urinary incontinence and concern for urinary retention. -Foley catheter placed on admission with good urine output of 1.9 L over the past 24 hours. -Renal function improving creatinine down to 0.97 from 1.80 on presentation. -Continue Foley catheter. -Outpatient follow-up with urology for voiding trial and further evaluation.  2.  Paroxysmal A-fib/WPW -Stable. -Optimize electrolytes. -Continue Toprol-XL.  3.  Hypertension -Continue Toprol-XL.  4.  GERD -Continue PPI, Pepcid.  5.  Chronic pancreatitis -Patient with complaints of abdominal pain similar to his chronic pancreatitis. -CT abdomen and pelvis done with changes of chronic pancreatitis with pseudocyst formation.  No acute abnormality identified. -Continue clear liquids for now. -Continue Creon. -Will discontinue fentanyl and placed on IV Dilaudid for severe pain.  Oxycodone for moderate and breakthrough pain. -Patient was to have a EUS and celiac plexus block tomorrow 03/02/2023. -Contacted by GI who would like to have procedure done during this hospitalization and procedure to be done tomorrow 03/02/2023. -N.p.o. after midnight except sips with meds. -Discontinue DVT prophylaxis of Lovenox.  6.  Hyperkalemia -Repeat potassium level of 4.9. -Repeat labs in the AM.   DVT prophylaxis: Lovenox>>>>> hold tomorrow dose in anticipation of procedure. Code Status: Full Family Communication: Updated patient.  No family at bedside. Disposition: Likely home when clinically improved.  Status is: Inpatient The patient will require care spanning > 2 midnights and should be moved to inpatient because: Severity of illness   Consultants:  None  Procedures:  CT abdomen and pelvis 02/28/2023 MRI of the T and L-spine 02/28/2023  Antimicrobials:  Anti-infectives (From admission, onward)    None         Subjective: Patient laying in bed.  Some complaints of lower abdominal pain when trying to urinate.  Still  with some  chronic upper abdominal pain. Patient noted early on ambulating in the hallways around the unit without any difficulty.  States tolerated clear liquids.  Foley catheter noted in place.  Objective: Vitals:   03/01/23 0517 03/01/23 0529 03/01/23 0827 03/01/23 1358  BP: 125/87  132/86 (!) 135/91  Pulse: 76 87 94 92  Resp: 17  16   Temp: 97.6 F (36.4 C)  97.9 F (36.6 C) 97.8 F (36.6 C)  TempSrc: Oral  Oral Oral  SpO2: (!) 78% 90% 99% 94%  Weight:      Height:        Intake/Output Summary (Last 24 hours) at 03/01/2023 1934 Last data filed at 03/01/2023 1845 Gross per 24 hour  Intake 1198.55 ml  Output 2750 ml  Net -1551.45 ml   Filed Weights   03/01/23 0245  Weight: 55.9 kg    Examination:  General exam: Appears calm and comfortable  Respiratory system: Clear to auscultation. Respiratory effort normal. Cardiovascular system: S1 & S2 heard, RRR. No JVD, murmurs, rubs, gallops or clicks. No pedal edema. Gastrointestinal system: Abdomen is nondistended, soft and some diffuse tenderness to palpation.  Positive bowel sounds.  No rebound.  No guarding.  Central nervous system: Alert and oriented. No focal neurological deficits. Extremities: Symmetric 5 x 5 power. Skin: No rashes, lesions or ulcers Psychiatry: Judgement and insight appear normal. Mood & affect appropriate.     Data Reviewed: I have personally reviewed following labs and imaging studies  CBC: Recent Labs  Lab 02/23/23 0024 02/27/23 2106 02/27/23 2120 02/28/23 1110 03/01/23 0418  WBC 7.6 6.5  --  7.4 8.4  NEUTROABS 4.9 3.3  --   --   --   HGB 9.6* 9.6* 12.9* 10.6* 10.4*  HCT 31.2* 31.0* 38.0* 34.8* 35.9*  MCV 86.7 84.7  --  86.1 89.1  PLT 155 164  --  172 157    Basic Metabolic Panel: Recent Labs  Lab 02/23/23 0024 02/27/23 2106 02/27/23 2120 02/28/23 1110 03/01/23 0418 03/01/23 0910  NA 136 137 140 135 139  --   K 5.1 4.1 4.3 4.4 5.3* 4.9  CL 106 106 110 104 109  --   CO2 18*  20*  --  22 21*  --   GLUCOSE 102* 82 82 94 112*  --   BUN 10 19 19 16 12   --   CREATININE 1.08 1.64* 1.80* 1.25* 0.97  --   CALCIUM 9.2 9.7  --  8.9 9.6  --     GFR: Estimated Creatinine Clearance: 69.6 mL/min (by C-G formula based on SCr of 0.97 mg/dL).  Liver Function Tests: Recent Labs  Lab 02/23/23 0024 02/27/23 2106  AST 37 35  ALT 24 23  ALKPHOS 82 92  BILITOT 0.5 0.7  PROT 7.4 8.4*  ALBUMIN 3.9 4.5    CBG: No results for input(s): "GLUCAP" in the last 168 hours.   Recent Results (from the past 240 hour(s))  Resp panel by RT-PCR (RSV, Flu A&B, Covid) Anterior Nasal Swab     Status: None   Collection Time: 02/28/23 12:34 AM   Specimen: Anterior Nasal Swab  Result Value Ref Range Status   SARS Coronavirus 2 by RT PCR NEGATIVE NEGATIVE Final    Comment: (NOTE) SARS-CoV-2 target nucleic acids are NOT DETECTED.  The SARS-CoV-2 RNA is generally detectable in upper respiratory specimens during the acute phase of infection. The lowest concentration of SARS-CoV-2 viral copies this assay can detect is 138 copies/mL. A negative result does  not preclude SARS-Cov-2 infection and should not be used as the sole basis for treatment or other patient management decisions. A negative result may occur with  improper specimen collection/handling, submission of specimen other than nasopharyngeal swab, presence of viral mutation(s) within the areas targeted by this assay, and inadequate number of viral copies(<138 copies/mL). A negative result must be combined with clinical observations, patient history, and epidemiological information. The expected result is Negative.  Fact Sheet for Patients:  BloggerCourse.com  Fact Sheet for Healthcare Providers:  SeriousBroker.it  This test is no t yet approved or cleared by the [place] FDA and  has been authorized for detection and/or diagnosis of SARS-CoV-2 by FDA under an  Emergency Use Authorization (EUA). This EUA will remain  in effect (meaning this test can be used) for the duration of the COVID-19 declaration under Section 564(b)(1) of the Act, 21 U.S.C.section 360bbb-3(b)(1), unless the authorization is terminated  or revoked sooner.       Influenza A by PCR NEGATIVE NEGATIVE Final   Influenza B by PCR NEGATIVE NEGATIVE Final    Comment: (NOTE) The Xpert Xpress SARS-CoV-2/FLU/RSV plus assay is intended as an aid in the diagnosis of influenza from Nasopharyngeal swab specimens and should not be used as a sole basis for treatment. Nasal washings and aspirates are unacceptable for Xpert Xpress SARS-CoV-2/FLU/RSV testing.  Fact Sheet for Patients: BloggerCourse.com  Fact Sheet for Healthcare Providers: SeriousBroker.it  This test is not yet approved or cleared by the [place] FDA and has been authorized for detection and/or diagnosis of SARS-CoV-2 by FDA under an Emergency Use Authorization (EUA). This EUA will remain in effect (meaning this test can be used) for the duration of the COVID-19 declaration under Section 564(b)(1) of the Act, 21 U.S.C. section 360bbb-3(b)(1), unless the authorization is terminated or revoked.     Resp Syncytial Virus by PCR NEGATIVE NEGATIVE Final    Comment: (NOTE) Fact Sheet for Patients: BloggerCourse.com  Fact Sheet for Healthcare Providers: SeriousBroker.it  This test is not yet approved or cleared by the [place] FDA and has been authorized for detection and/or diagnosis of SARS-CoV-2 by FDA under an Emergency Use Authorization (EUA). This EUA will remain in effect (meaning this test can be used) for the duration of the COVID-19 declaration under Section 564(b)(1) of the Act, 21 U.S.C. section 360bbb-3(b)(1), unless the authorization is terminated or revoked.  Performed at [facility], [address], [place], [place] [address]          Radiology Studies: MR Lumbar Spine W Wo Contrast  Result Date: 02/28/2023 CLINICAL DATA:  52 year old male with flank and back pain, urinary retention, left lower extremity weakness. Acute myelopathy. EXAM: MRI LUMBAR SPINE WITHOUT AND WITH CONTRAST TECHNIQUE: Multiplanar and multiecho pulse sequences of the lumbar spine were obtained without and with intravenous contrast. CONTRAST:  5.56mL GADAVIST GADOBUTROL 1 MMOL/ML IV SOLN in conjunction with contrast enhanced imaging of the thoracic spine reported separately. COMPARISON:  Lumbar MRI 05/21/2022. Thoracic MRI today reported separately. CT Abdomen and Pelvis. Yesterday FINDINGS: Segmentation: Normal, concordant with the thoracic numbering today and also the previous lumbar MRI in January. Alignment: Stable straightening of lumbar lordosis, with subtle degenerative appearing retrolisthesis of L3 on L4 through L5-S1. No significant scoliosis. Vertebrae: Normal background bone marrow signal. Mild acute on chronic L5-S1 endplate degeneration including far lateral mild endplate marrow edema which is greater on the right. See additional details of that level below. No other acute osseous abnormality identified.  Intact visible sacrum and SI joints. Conus medullaris and cauda equina: Conus extends to the T12-L1 level. No convincing lower spinal cord or conus signal abnormality. No abnormal intradural enhancement. Cauda equina nerve roots are within normal limits. No dural thickening. Paraspinal and other soft tissues: Stable to the CT Abdomen and Pelvis yesterday, including abnormal pancreas with partially visible cystic masses in the tail. Negative visualized posterior paraspinal soft tissues. Disc levels: T11-T12 and T12-L1:  Negative. L1-L2: Mild disc space loss and disc bulging without stenosis, stable. L2-L3: Mild circumferential disc bulge, facet and ligament flavum hypertrophy without  stenosis is stable. L3-L4: Subtle retrolisthesis. Circumferential disc bulge with small posterior annular fissure (series 4, image 23). Mild facet and ligament flavum hypertrophy. No spinal or lateral recess stenosis. Mild L3 foraminal stenosis on the left is stable. L4-L5: Chronic disc bulging with broad-based posterior central disc protrusion and annular fissure (series 4, image 30). Ligament flavum and mild facet hypertrophy. Disc effaces the descending L5 nerve roots in the lateral recesses, without spinal stenosis. No foraminal stenosis. L5-S1: Moderate to severe disc space loss and evidence of vacuum disc. Circumferential disc osteophyte complex with a left paracentral posterior component (series 4, image 35). Chronically effaced descending left S1 nerve roots in the lateral recesses although no convincing lateral recess stenosis. No spinal stenosis. Mild to moderate bilateral L5 foraminal stenosis appears stable and greater on the right. IMPRESSION: 1. Stable lumbar spine degeneration from a January MRI with no lumbar spinal stenosis. Chronic disc degeneration which could be a source of L5 and S1 radiculitis. And up to moderate chronic bilateral L5 neural foraminal stenosis. 2. Conus medullaris and cauda equina nerve roots are stable and within normal limits. See Thoracic MRI today reported separately. 3. Abnormal pancreas. See details on CT Abdomen and Pelvis yesterday. Electronically Signed   By: Odessa Fleming M.D.   On: 02/28/2023 05:21   MR THORACIC SPINE W WO CONTRAST  Result Date: 02/28/2023 CLINICAL DATA:  52 year old male with flank and back pain, urinary retention, left lower extremity weakness. Acute myelopathy. EXAM: MRI THORACIC WITHOUT AND WITH CONTRAST TECHNIQUE: Multiplanar and multiecho pulse sequences of the thoracic spine were obtained without and with intravenous contrast. CONTRAST:  5.52mL GADAVIST GADOBUTROL 1 MMOL/ML IV SOLN COMPARISON:  Chest radiographs 02/23/2023.  CTA chest 12/06/2021.  Cervical spine CT 03/05/2020. FINDINGS: Limited cervical spine imaging: Age advanced cervical spine degeneration, appearing similar to the 2021 CT. Altered cervical vertebral marrow signal at C4 and C5 corresponds to conspicuous degenerative appearing sclerosis on the prior CT. Mild chronic spondylolisthesis. Evidence of degenerative cervical spinal stenosis at C3-C4. Thoracic spine segmentation:  Normal on the comparison CTA. Alignment: Normal thoracic kyphosis. No thoracic spondylolisthesis. Vertebrae: Normal background thoracic vertebral marrow signal. Maintained vertebral body height. No marrow edema or evidence of acute osseous abnormality. Mild chronic inferior endplate degeneration at T1. Cord: Intermittent mild motion artifact on the axial thoracic images. And on both axial images (especially series 21) and sagittal T2/stir (series 19, image 10), it is difficult to exclude scattered abnormal increased signal within the thoracic spinal cord. The levels below T10 would be spared. However, there is no cord expansion, and no evidence of abnormal spinal cord or intradural enhancement. No dural thickening. Furthermore, thoracic spinal cord volume seems relatively normal. The conus medullaris appears to remain normal at T12-L1, see also lumbar MRI today reported separately. Paraspinal and other soft tissues: Negative. Disc levels: Capacious thoracic spinal canal little to no age advanced thoracic spine degeneration as follows  T1-T2: Disc desiccation, mild disc space loss. Mild disc bulging. No stenosis. T2-T3: Disc desiccation.  Otherwise negative. T7-T8: Minor disc desiccation.  Otherwise negative. IMPRESSION: 1. Renal versus Artifactual thoracic spinal cord T2 signal heterogeneity at the levels T5 through T10. No associated cord expansion, edema, or enhancement. Subsequently, difficult to exclude chronic thoracic demyelination or transverse myelitis. 2. Capacious thoracic spinal canal with minimal for age  thoracic spine degeneration. No thoracic spinal stenosis or neural impingement. 3. Partially visible chronically advanced cervical spine degeneration, similar to a 2021 CT. Suspected degenerative spinal stenosis at C3-C4. Electronically Signed   By: Odessa Fleming M.D.   On: 02/28/2023 05:13   CT ABDOMEN PELVIS WO CONTRAST  Result Date: 02/28/2023 CLINICAL DATA:  Left-sided flank pain, history of pancreatitis, initial encounter EXAM: CT ABDOMEN AND PELVIS WITHOUT CONTRAST TECHNIQUE: Multidetector CT imaging of the abdomen and pelvis was performed following the standard protocol without IV contrast. RADIATION DOSE REDUCTION: This exam was performed according to the departmental dose-optimization program which includes automated exposure control, adjustment of the mA and/or kV according to patient size and/or use of iterative reconstruction technique. COMPARISON:  01/25/2023 FINDINGS: Lower chest: Lung bases are free of acute infiltrate or sizable effusion. Hepatobiliary: No focal liver abnormality is seen. No gallstones, gallbladder wall thickening, or biliary dilatation. Pancreas: Scattered calcifications consistent with chronic pancreatitis are again seen. Cystic lesions are noted in the body and tail of the pancreas. These are also stable from the prior exam. The largest of these measures 3.9 cm in the pancreatic body. Spleen: Normal in size without focal abnormality. Adrenals/Urinary Tract: Adrenal glands are within normal limits.Kidneys demonstrate a normal appearance without renal calculi. No obstructive changes are seen. The bladder is well distended. Stomach/Bowel: Scattered fecal material is noted throughout the colon without obstructive change. The appendix is within normal limits. Small bowel is unremarkable. Stomach is within normal limits as well. Vascular/Lymphatic: Aortic atherosclerosis. No enlarged abdominal or pelvic lymph nodes. Reproductive: Prostate is unremarkable. Other: No abdominal wall hernia  or abnormality. No abdominopelvic ascites. Musculoskeletal: No acute or significant osseous findings. IMPRESSION: Changes of chronic pancreatitis with pseudocyst formation. No acute abnormality is identified to correspond with the given clinical history. Electronically Signed   By: Alcide Clever M.D.   On: 02/28/2023 01:12        Scheduled Meds:  Chlorhexidine Gluconate Cloth  6 each Topical Daily   famotidine  20 mg Oral BID   fluticasone  2 spray Each Nare Daily   lipase/protease/amylase  72,000 Units Oral TID WC   liver oil-zinc oxide   Topical BID   metoprolol  200 mg Oral Daily   pantoprazole  40 mg Oral BID AC   pregabalin  100 mg Oral BID   sucralfate  1 g Oral TID WC   Continuous Infusions:  dextrose 5 % and 0.9 % NaCl 100 mL/hr at 03/01/23 1053     LOS: 0 days    Time spent: 40 minutes    Ramiro Harvest, MD [facility]   To contact the attending provider between 7A-7P or the covering provider during after hours 7P-7A, please log into the web site [email or web address] and access using universal [place] password for that web site. If you do not have the password, please call the hospital operator.  03/01/2023, 7:34 PM

## 2023-03-02 ENCOUNTER — Other Ambulatory Visit: Payer: Self-pay

## 2023-03-02 ENCOUNTER — Ambulatory Visit (HOSPITAL_COMMUNITY): Admission: RE | Admit: 2023-03-02 | Payer: MEDICAID | Source: Home / Self Care | Admitting: Gastroenterology

## 2023-03-02 DIAGNOSIS — N179 Acute kidney failure, unspecified: Secondary | ICD-10-CM | POA: Diagnosis not present

## 2023-03-02 DIAGNOSIS — I480 Paroxysmal atrial fibrillation: Secondary | ICD-10-CM | POA: Diagnosis not present

## 2023-03-02 DIAGNOSIS — Z8711 Personal history of peptic ulcer disease: Secondary | ICD-10-CM

## 2023-03-02 DIAGNOSIS — K862 Cyst of pancreas: Secondary | ICD-10-CM

## 2023-03-02 DIAGNOSIS — K860 Alcohol-induced chronic pancreatitis: Secondary | ICD-10-CM

## 2023-03-02 DIAGNOSIS — I456 Pre-excitation syndrome: Secondary | ICD-10-CM | POA: Diagnosis not present

## 2023-03-02 DIAGNOSIS — I10 Essential (primary) hypertension: Secondary | ICD-10-CM | POA: Diagnosis not present

## 2023-03-02 LAB — COMPREHENSIVE METABOLIC PANEL
ALT: 17 U/L (ref 0–44)
AST: 27 U/L (ref 15–41)
Albumin: 3.5 g/dL (ref 3.5–5.0)
Alkaline Phosphatase: 67 U/L (ref 38–126)
Anion gap: 7 (ref 5–15)
BUN: 7 mg/dL (ref 6–20)
CO2: 19 mmol/L — ABNORMAL LOW (ref 22–32)
Calcium: 9.2 mg/dL (ref 8.9–10.3)
Chloride: 112 mmol/L — ABNORMAL HIGH (ref 98–111)
Creatinine, Ser: 0.77 mg/dL (ref 0.61–1.24)
GFR, Estimated: >60 mL/min (ref >60)
Glucose, Bld: 77 mg/dL (ref 70–99)
Potassium: 5.6 mmol/L — ABNORMAL HIGH (ref 3.5–5.1)
Sodium: 138 mmol/L (ref 135–145)
Total Bilirubin: 0.7 mg/dL (ref 0.3–1.2)
Total Protein: 6.9 g/dL (ref 6.5–8.1)

## 2023-03-02 LAB — POTASSIUM
Potassium: 6.1 mmol/L — ABNORMAL HIGH (ref 3.5–5.1)
Potassium: 5.4 mmol/L — ABNORMAL HIGH (ref 3.5–5.1)

## 2023-03-02 LAB — CBC
HCT: 30 % — ABNORMAL LOW (ref 39.0–52.0)
Hemoglobin: 8.7 g/dL — ABNORMAL LOW (ref 13.0–17.0)
MCH: 26 pg (ref 26.0–34.0)
MCHC: 29 g/dL — ABNORMAL LOW (ref 30.0–36.0)
MCV: 89.6 fL (ref 80.0–100.0)
Platelets: 135 10*3/uL — ABNORMAL LOW (ref 150–400)
RBC: 3.35 MIL/uL — ABNORMAL LOW (ref 4.22–5.81)
RDW: 19.9 % — ABNORMAL HIGH (ref 11.5–15.5)
WBC: 7.1 10*3/uL (ref 4.0–10.5)
nRBC: 0 % (ref 0.0–0.2)

## 2023-03-02 LAB — MAGNESIUM: Magnesium: 1.1 mg/dL — ABNORMAL LOW (ref 1.7–2.4)

## 2023-03-02 MED ORDER — SODIUM ZIRCONIUM CYCLOSILICATE 10 G PO PACK
10.0000 g | PACK | Freq: Two times a day (BID) | ORAL | Status: DC
  Administered 2023-03-02: 10 g via ORAL
  Filled 2023-03-02: qty 1

## 2023-03-02 MED ORDER — SODIUM ZIRCONIUM CYCLOSILICATE 10 G PO PACK
10.0000 g | PACK | Freq: Three times a day (TID) | ORAL | Status: DC
  Administered 2023-03-02 – 2023-03-03 (×3): 10 g via ORAL
  Filled 2023-03-02 (×3): qty 1

## 2023-03-02 MED ORDER — MAGNESIUM SULFATE 4 GM/100ML IV SOLN
4.0000 g | Freq: Once | INTRAVENOUS | Status: AC
  Administered 2023-03-02: 4 g via INTRAVENOUS
  Filled 2023-03-02: qty 100

## 2023-03-02 NOTE — Progress Notes (Addendum)
PROGRESS NOTE    [name]  WUJ:[id number] DOB: 1970/05/10 DOA: 02/27/2023 PCP: Default, Provider, MD    Chief Complaint  Patient presents with   Abdominal Pain    Brief Narrative:  Patient 52 year old gentleman history of chronic alcoholism, polysubstance abuse, anemia, depression/anxiety, bipolar disorder, history of suicide attempt, PTSD, OA, chronic bronchitis, chronic lower back pain, chronic pancreatitis, cocaine abuse, GERD, hiatal hernia, hypertension, WPW, sickle cell trait presented to the ED with complaints of right upper quadrant abdominal pain similar to his pancreatitis pain, some burning with dysuria and suprapubic discomfort down to the scrotum area with some urinary incontinence.  Urinalysis done unremarkable.  UDS positive for cocaine and amphetamines.  CRP sed rate normal.  CT abdomen and pelvis done with changes of chronic pancreatitis with pseudocyst formation.  No acute abnormality identified.  MRI of the L-spine with stable lumbar spine degeneration.  Thoracic spine cord T2 signal heterogenicity at the levels of T5-T10.  No thoracic spinal stenosis or neural impingement.  Patient noted to be in AKI, noted to have some urinary retention, Foley catheter placed, electrolytes optimized and pain managed.  Patient noted to be scheduled for EUS with celiac plexus block on 03/02/2023, per GI patient to undergo this procedure in house tomorrow 03/02/2023 if medically stable as patient with difficulty with outpatient follow-up with appointments.   Assessment & Plan:   Principal Problem:   AKI (acute kidney injury) (HCC) Active Problems:   Paroxysmal atrial fibrillation (HCC)   Essential hypertension   GERD (gastroesophageal reflux disease)   WPW (Wolff-Parkinson-White syndrome)   Acute urinary retention   #1 acute kidney injury/urinary retention -Likely multifactorial secondary to a prerenal azotemia in the setting of dehydration and likelyalsopostrenal as  patient with urinary incontinence and concern for urinary retention. -Foley catheter placed on admission with good urine output of 3.275 L over the past 24 hours. -Renal function improving creatinine down to 0.77 from 1.80 on presentation. -Continue Foley catheter. -Outpatient follow-up with urology for voiding trial and further evaluation.  2.  Paroxysmal A-fib/WPW -Stable. -Optimize electrolytes. -Toprol-XL.    3.  Hypertension -Continue Toprol-XL.  4.  GERD -PPI, Pepcid.    5.  Chronic pancreatitis -Patient with complaints of abdominal pain similar to his chronic pancreatitis. -CT abdomen and pelvis done with changes of chronic pancreatitis with pseudocyst formation.  No acute abnormality identified. -Patient currently n.p.o. in anticipation of GI procedure today.   -Hold Creon and resume once placed back on a diet.  -Fentanyl discontinued. -Continue IV Dilaudid for severe pain, oxycodone for moderate and breakthrough pain. -Patient was to have a EUS and celiac plexus block in the outpatient setting, today 03/02/2023. -Contacted by GI who would like to have procedure done during this hospitalization and procedure to be done hopefully today 03/02/2023 if resolution/improvement with hyperkalemia.   -Patient given a dose of Lokelma this morning awaiting repeat labs.   -Currently NPO.   -Per GI.  6.  Hyperkalemia -Potassium noted at 5.6 this morning with repeat at 6.1.   -Place on Lokelma.   -Repeat labs this afternoon.  7.  Hypomagnesemia -Magnesium at 1.1. -Magnesium sulfate 4 g IV x 1. -Repeat labs in the AM.   DVT prophylaxis: SCDs.  Code Status: Full Family Communication: Updated patient.  No family at bedside. Disposition: Likely home when clinically improved and post GI procedure..  Status is: Inpatient The patient will require care spanning > 2 midnights and should be moved to inpatient because: Severity of illness  Consultants:  None  Procedures:  CT  abdomen and pelvis 02/28/2023 MRI of the T and L-spine 02/28/2023  Antimicrobials:  Anti-infectives (From admission, onward)    None         Subjective: Patient noted ambulating in hallway with IV pole.  States he feels he may be having an acute pancreatitis flare with some nausea and some upper abdominal pain.  No emesis.  Some improvement lower abdominal pain.  Foley catheter in place.    Objective: Vitals:   03/01/23 0529 03/01/23 0827 03/01/23 1358 03/02/23 0441  BP:  132/86 (!) 135/91 (!) 161/106  Pulse: 87 94 92 79  Resp:  16  18  Temp:  97.9 F (36.6 C) 97.8 F (36.6 C) 98.1 F (36.7 C)  TempSrc:  Oral Oral Oral  SpO2: 90% 99% 94% 100%  Weight:      Height:        Intake/Output Summary (Last 24 hours) at 03/02/2023 1239 Last data filed at 03/02/2023 0900 Gross per 24 hour  Intake 2663.67 ml  Output 3825 ml  Net -1161.33 ml   Filed Weights   03/01/23 0245  Weight: 55.9 kg    Examination:  General exam: NAD Respiratory system: Lungs clear to auscultation bilaterally.  No wheezes, no crackles, no rhonchi.  Fair air movement.  Speaking in full sentences.  Cardiovascular system: Regular rate rhythm no murmurs rubs or gallops.  No JVD.  No pitting lower extremity edema.  Gastrointestinal system: Abdomen is soft, nondistended, some diffuse tenderness to palpation especially in the upper abdominal region.  No rebound.  No guarding.  Positive bowel sounds.  Central nervous system: Alert and oriented. No focal neurological deficits. Extremities: Symmetric 5 x 5 power. Skin: No rashes, lesions or ulcers Psychiatry: Judgement and insight appear normal. Mood & affect appropriate.     Data Reviewed: I have personally reviewed following labs and imaging studies  CBC: Recent Labs  Lab 02/27/23 2106 02/27/23 2120 02/28/23 1110 03/01/23 0418 03/02/23 0430  WBC 6.5  --  7.4 8.4 7.1  NEUTROABS 3.3  --   --   --   --   HGB 9.6* 12.9* 10.6* 10.4* 8.7*  HCT 31.0*  38.0* 34.8* 35.9* 30.0*  MCV 84.7  --  86.1 89.1 89.6  PLT 164  --  172 157 135*    Basic Metabolic Panel: Recent Labs  Lab 02/27/23 2106 02/27/23 2120 02/28/23 1110 03/01/23 0418 03/01/23 0910 03/02/23 0430 03/02/23 0812  NA 137 140 135 139  --  138  --   K 4.1 4.3 4.4 5.3* 4.9 5.6* 6.1*  CL 106 110 104 109  --  112*  --   CO2 20*  --  22 21*  --  19*  --   GLUCOSE 82 82 94 112*  --  77  --   BUN 19 19 16 12   --  7  --   CREATININE 1.64* 1.80* 1.25* 0.97  --  0.77  --   CALCIUM 9.7  --  8.9 9.6  --  9.2  --   MG  --   --   --   --   --  1.1*  --     GFR: Estimated Creatinine Clearance: 84.4 mL/min (by C-G formula based on SCr of 0.77 mg/dL).  Liver Function Tests: Recent Labs  Lab 02/27/23 2106 03/02/23 0430  AST 35 27  ALT 23 17  ALKPHOS 92 67  BILITOT 0.7 0.7  PROT 8.4* 6.9  ALBUMIN 4.5 3.5    CBG: No results for input(s): "GLUCAP" in the last 168 hours.   Recent Results (from the past 240 hour(s))  Resp panel by RT-PCR (RSV, Flu A&B, Covid) Anterior Nasal Swab     Status: None   Collection Time: 02/28/23 12:34 AM   Specimen: Anterior Nasal Swab  Result Value Ref Range Status   SARS Coronavirus 2 by RT PCR NEGATIVE NEGATIVE Final    Comment: (NOTE) SARS-CoV-2 target nucleic acids are NOT DETECTED.  The SARS-CoV-2 RNA is generally detectable in upper respiratory specimens during the acute phase of infection. The lowest concentration of SARS-CoV-2 viral copies this assay can detect is 138 copies/mL. A negative result does not preclude SARS-Cov-2 infection and should not be used as the sole basis for treatment or other patient management decisions. A negative result may occur with  improper specimen collection/handling, submission of specimen other than nasopharyngeal swab, presence of viral mutation(s) within the areas targeted by this assay, and inadequate number of viral copies(<138 copies/mL). A negative result must be combined with clinical  observations, patient history, and epidemiological information. The expected result is Negative.  Fact Sheet for Patients:  BloggerCourse.com  Fact Sheet for Healthcare Providers:  SeriousBroker.it  This test is no t yet approved or cleared by the [place] FDA and  has been authorized for detection and/or diagnosis of SARS-CoV-2 by FDA under an Emergency Use Authorization (EUA). This EUA will remain  in effect (meaning this test can be used) for the duration of the COVID-19 declaration under Section 564(b)(1) of the Act, 21 U.S.C.section 360bbb-3(b)(1), unless the authorization is terminated  or revoked sooner.       Influenza A by PCR NEGATIVE NEGATIVE Final   Influenza B by PCR NEGATIVE NEGATIVE Final    Comment: (NOTE) The Xpert Xpress SARS-CoV-2/FLU/RSV plus assay is intended as an aid in the diagnosis of influenza from Nasopharyngeal swab specimens and should not be used as a sole basis for treatment. Nasal washings and aspirates are unacceptable for Xpert Xpress SARS-CoV-2/FLU/RSV testing.  Fact Sheet for Patients: BloggerCourse.com  Fact Sheet for Healthcare Providers: SeriousBroker.it  This test is not yet approved or cleared by the [place] FDA and has been authorized for detection and/or diagnosis of SARS-CoV-2 by FDA under an Emergency Use Authorization (EUA). This EUA will remain in effect (meaning this test can be used) for the duration of the COVID-19 declaration under Section 564(b)(1) of the Act, 21 U.S.C. section 360bbb-3(b)(1), unless the authorization is terminated or revoked.     Resp Syncytial Virus by PCR NEGATIVE NEGATIVE Final    Comment: (NOTE) Fact Sheet for Patients: BloggerCourse.com  Fact Sheet for Healthcare Providers: SeriousBroker.it  This test is not yet approved or cleared by  the [place] FDA and has been authorized for detection and/or diagnosis of SARS-CoV-2 by FDA under an Emergency Use Authorization (EUA). This EUA will remain in effect (meaning this test can be used) for the duration of the COVID-19 declaration under Section 564(b)(1) of the Act, 21 U.S.C. section 360bbb-3(b)(1), unless the authorization is terminated or revoked.  Performed at [facility], [address], [place], [place] [address]          Radiology Studies: No results found.      Scheduled Meds:  Chlorhexidine Gluconate Cloth  6 each Topical Daily   famotidine  20 mg Oral BID   fluticasone  2 spray Each Nare Daily   lipase/protease/amylase  72,000 Units Oral TID  WC   liver oil-zinc oxide   Topical BID   metoprolol  200 mg Oral Daily   pantoprazole  40 mg Oral BID AC   pregabalin  100 mg Oral BID   sodium zirconium cyclosilicate  10 g Oral BID   sucralfate  1 g Oral TID WC   Continuous Infusions:     LOS: 1 day    Time spent: 35 minutes    Ramiro Harvest, MD [facility]   To contact the attending provider between 7A-7P or the covering provider during after hours 7P-7A, please log into the web site [email or web address] and access using universal [place] password for that web site. If you do not have the password, please call the hospital operator.  03/02/2023, 12:39 PM

## 2023-03-02 NOTE — TOC Initial Note (Signed)
Transition of Care [facility]) - Initial/Assessment Note    Patient Details  Name: [name] MRN: [id number] Date of Birth: 08/16/1970  [facility]) CM/SW Contact:    Larrie Kass, LCSW Phone Number: 03/02/2023, 10:42 AM  Clinical Narrative:                 CSW received consult for substance use resources. CSW spoke with pt, who stated he would like resources. Pt stated he had done residential in the past. He said he would like resources for residential facilities, and OP follow-up does not work. CSW attached resources to pt's AVS. Pt will need to follow up with facilities on his own. Pt denied transportation and DME needs. No further TOC needs TOC sign-off.   Expected Discharge Plan: Home/Self Care Barriers to Discharge: Continued Medical Work up   Patient Goals and CMS Choice Patient states their goals for this hospitalization and ongoing recovery are:: retrun home and work on residental treatment          Expected Discharge Plan and Services       Living arrangements for the past 2 months: Single Family Home                                      Prior Living Arrangements/Services Living arrangements for the past 2 months: Single Family Home Lives with:: Self Patient language and need for interpreter reviewed:: Yes Do you feel safe going back to the place where you live?: Yes      Need for Family Participation in Patient Care: No (Comment) Care giver support system in place?: No (comment)   Criminal Activity/Legal Involvement Pertinent to Current Situation/Hospitalization: No - Comment as needed  Activities of Daily Living   ADL Screening (condition at time of admission) Independently performs ADLs?: Yes (appropriate for developmental age) Is the patient deaf or have difficulty hearing?: No Does the patient have difficulty seeing, even when wearing glasses/contacts?: No Does the patient have difficulty concentrating, remembering,  or making decisions?: No  Permission Sought/Granted                  Emotional Assessment Appearance:: Appears stated age Attitude/Demeanor/Rapport: Gracious, Engaged Affect (typically observed): Accepting Orientation: : Oriented to Self, Oriented to Place, Oriented to  Time, Oriented to Situation Alcohol / Substance Use: Alcohol Use, Illicit Drugs Psych Involvement: No (comment)  Admission diagnosis:  Lumbosacral radiculopathy [M54.17] Weakness [R53.1] Generalized abdominal pain [R10.84] AKI (acute kidney injury) (HCC) [N17.9] Acute urinary retention [R33.8] Acute midline thoracic back pain [M54.6] Patient Active Problem List   Diagnosis Date Noted   Acute urinary retention 03/01/2023   Intractable pain 02/09/2023   Metabolic acidosis, increased anion gap 02/08/2023   Cocaine abuse (HCC) 02/08/2023   Adynamic ileus (HCC) 01/25/2023   Cannabinoid hyperemesis syndrome 01/25/2023   Epigastric abdominal pain 01/09/2023   Asthma, chronic 01/09/2023   Ileus (HCC) 01/09/2023   Hypokalemia 09/18/2022   NSVT (nonsustained ventricular tachycardia) (HCC) 01/20/2022   Normal anion gap metabolic acidosis 12/08/2021   Hypoalbuminemia 12/08/2021   Acute recurrent pancreatitis 12/07/2021   WPW (Wolff-Parkinson-White syndrome) 12/06/2021   Paroxysmal atrial fibrillation (HCC) 12/06/2021   Pulmonary nodule 12/06/2021   Leukocytosis 06/07/2021   Epigastric pain 06/07/2021   Acute alcoholic pancreatitis 06/06/2021   Urinary retention    Protein-calorie malnutrition, severe 11/17/2020   Acute pancreatitis 11/15/2020   Hypomagnesemia  Hypocalcemia 10/23/2020   AKI (acute kidney injury) (HCC) 11/13/2018   Seizure (HCC) 11/13/2018   Chest pain 01/08/2018   Acute on chronic pancreatitis (HCC) 09/28/2017   Abdominal pain 05/27/2017   Tachycardia 03/18/2017   Nausea, vomiting, and diarrhea 03/18/2017   Diarrhea 03/18/2017   GERD (gastroesophageal reflux disease)    Alcohol use  disorder 12/05/2016   Normocytic anemia 12/05/2016   Alcohol use disorder, severe, dependence (HCC) 07/25/2016   Cocaine use disorder, severe, dependence (HCC) 07/25/2016   Major depressive disorder, 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   Chronic pancreatitis (HCC) 05/18/2015   Polysubstance abuse (tobacco, cocaine, THC, and ETOH) 03/26/2015   Essential hypertension 02/06/2014   Mood disorder (HCC) 02/06/2014   Pancreatic pseudocyst/cyst 11/25/2013   Phillips Odor White pattern seen on electrocardiogram 10/03/2012   Alcohol abuse 03/23/2007   Tobacco abuse 03/23/2007   PCP:  Default, Provider, MD Pharmacy:   [facility] [address] [place] [address] Phone: [phone] Fax: [phone]  [id number] - [place], [place] - [address] AT [facility] [address] [place] [address] Phone: [phone] Fax: [phone]  [facility] [address] [place] [address] Phone: [phone] Fax: [phone]  [id number] [place], [place] - [address] AT [facility] [address] [place] [address] Phone: [phone] Fax: [phone]     Social Determinants of Health (SDOH) Social History: SDOH Screenings   Food Insecurity: No Food Insecurity (02/28/2023)  Housing: Patient Declined (02/28/2023)  Transportation Needs: No Transportation Needs (02/28/2023)  Utilities: Not At Risk (02/28/2023)  Alcohol Screen: Medium Risk (03/14/2017)  Depression (PHQ2-9): Medium Risk (03/27/2020)  Tobacco Use: High Risk (03/01/2023)   SDOH Interventions:     Readmission Risk Interventions    01/10/2023    4:32 PM 01/02/2023    2:44 PM 12/16/2021    4:07 PM  Readmission Risk Prevention Plan  Transportation Screening Complete Complete Complete  Medication Review Furniture conservator/restorer) Complete Complete Referral to Pharmacy  PCP or Specialist appointment within 3-5 days of discharge Complete Complete Complete  HRI or Home Care Consult Complete Complete Complete  SW Recovery Care/Counseling Consult Complete Complete Complete  Palliative Care Screening Not Applicable Not Applicable Not Applicable  Skilled Nursing Facility Not Applicable Not Applicable Not Applicable

## 2023-03-02 NOTE — Discharge Instructions (Signed)
To help you maintain a sober lifestyle, a substance abuse treatment program may be beneficial to you.  Contact one of the following providers at your earliest opportunity to ask about enrolling their program:  RESIDENTIAL PROGRAMS:       [facility]      [address]      [place], [place] [address]      [phone]       [facility]      [address]      [place], [place] [address]      [phone]       [facility]      [address]      [place], [place] [address]      [phone]       [facility]      [address]      [place], [place] [address]      [phone]       [facility]      [address]      [place], [place] [address]      [phone]       [facility]      [address]      [place], [place] [address]      [phone]       [facility]      [address]      [place], [place] [address]      [phone]  OUTPATIENT PROGRAMS:       [facility]      [address]      [place],  [address]      [phone]      Ask about their Substance Abuse Intensive Outpatient Program.  They also offer psychiatry/medication management and therapy.  New patients are being seen in their walk-in clinic.  Walk-in hours are Monday - Thursday from 8:00 am - 11:00 am for psychiatry, and Friday from 1:00 pm - 4:00 pm for therapy.  Walk-in patients are seen on a first come, first served basis, so try to arrive as early as possible for the best chance of being seen the same day.  

## 2023-03-02 NOTE — Plan of Care (Signed)

## 2023-03-02 NOTE — Anesthesia Preprocedure Evaluation (Addendum)
Anesthesia Evaluation  Patient identified by MRN, date of birth, ID band Patient awake    Reviewed: Allergy & Precautions, Patient's Chart, lab work & pertinent test results  History of Anesthesia Complications Negative for: history of anesthetic complications  Airway Mallampati: III  TM Distance: >3 FB     Dental  (+) Edentulous Upper, Poor Dentition   Pulmonary asthma , Current Smoker and Patient abstained from smoking.   breath sounds clear to auscultation       Cardiovascular hypertension, Pt. on medications and Pt. on home beta blockers +CHF (last echo with mildly reduced function)  (-) Past MI + dysrhythmias (WPW s/p ablation) Atrial Fibrillation and Supra Ventricular Tachycardia  Rhythm:Regular Rate:Normal   Hx WPW    Neuro/Psych  Headaches, Seizures -,  PSYCHIATRIC DISORDERS Anxiety Depression Bipolar Disorder    Hx suicide attempt    GI/Hepatic hiatal hernia,GERD  Medicated,,(+)     substance abuse  alcohol use, cocaine use and marijuana use  Endo/Other   K 5.6   Renal/GU Renal disease     Musculoskeletal  (+) Arthritis ,    Abdominal   Peds  Hematology  (+) Blood dyscrasia, Sickle cell trait and anemia  Plt 135k    Anesthesia Other Findings   Reproductive/Obstetrics                             Anesthesia Physical Anesthesia Plan  ASA: 3  Anesthesia Plan: MAC   Post-op Pain Management: Minimal or no pain anticipated   Induction:   PONV Risk Score and Plan: 0 and Propofol infusion and Treatment may vary due to age or medical condition  Airway Management Planned: Nasal Cannula and Natural Airway  Additional Equipment: None  Intra-op Plan:   Post-operative Plan:   Informed Consent: I have reviewed the patients History and Physical, chart, labs and discussed the procedure including the risks, benefits and alternatives for the proposed anesthesia with the  patient or authorized representative who has indicated his/her understanding and acceptance.     Dental advisory given  Plan Discussed with: CRNA  Anesthesia Plan Comments:         Anesthesia Quick Evaluation

## 2023-03-03 DIAGNOSIS — I456 Pre-excitation syndrome: Secondary | ICD-10-CM | POA: Diagnosis not present

## 2023-03-03 DIAGNOSIS — I480 Paroxysmal atrial fibrillation: Secondary | ICD-10-CM | POA: Diagnosis not present

## 2023-03-03 DIAGNOSIS — E875 Hyperkalemia: Secondary | ICD-10-CM

## 2023-03-03 DIAGNOSIS — N179 Acute kidney failure, unspecified: Secondary | ICD-10-CM | POA: Diagnosis not present

## 2023-03-03 DIAGNOSIS — I10 Essential (primary) hypertension: Secondary | ICD-10-CM | POA: Diagnosis not present

## 2023-03-03 LAB — MAGNESIUM: Magnesium: 1.2 mg/dL — ABNORMAL LOW (ref 1.7–2.4)

## 2023-03-03 LAB — CBC WITH DIFFERENTIAL/PLATELET
Abs Immature Granulocytes: 0.02 10*3/uL (ref 0.00–0.07)
Basophils Absolute: 0 10*3/uL (ref 0.0–0.1)
Basophils Relative: 0 %
Eosinophils Absolute: 0.1 10*3/uL (ref 0.0–0.5)
Eosinophils Relative: 2 %
HCT: 29.2 % — ABNORMAL LOW (ref 39.0–52.0)
Hemoglobin: 8.5 g/dL — ABNORMAL LOW (ref 13.0–17.0)
Immature Granulocytes: 0 %
Lymphocytes Relative: 36 %
Lymphs Abs: 2.2 10*3/uL (ref 0.7–4.0)
MCH: 26.3 pg (ref 26.0–34.0)
MCHC: 29.1 g/dL — ABNORMAL LOW (ref 30.0–36.0)
MCV: 90.4 fL (ref 80.0–100.0)
Monocytes Absolute: 1 10*3/uL (ref 0.1–1.0)
Monocytes Relative: 16 %
Neutro Abs: 2.7 10*3/uL (ref 1.7–7.7)
Neutrophils Relative %: 46 %
Platelets: 137 10*3/uL — ABNORMAL LOW (ref 150–400)
RBC: 3.23 MIL/uL — ABNORMAL LOW (ref 4.22–5.81)
RDW: 20.3 % — ABNORMAL HIGH (ref 11.5–15.5)
WBC: 6 10*3/uL (ref 4.0–10.5)
nRBC: 0 % (ref 0.0–0.2)

## 2023-03-03 LAB — BASIC METABOLIC PANEL
Anion gap: 8 (ref 5–15)
BUN: <5 mg/dL — ABNORMAL LOW (ref 6–20)
CO2: 20 mmol/L — ABNORMAL LOW (ref 22–32)
Calcium: 9 mg/dL (ref 8.9–10.3)
Chloride: 111 mmol/L (ref 98–111)
Creatinine, Ser: 0.79 mg/dL (ref 0.61–1.24)
GFR, Estimated: >60 mL/min (ref >60)
Glucose, Bld: 96 mg/dL (ref 70–99)
Potassium: 4.6 mmol/L (ref 3.5–5.1)
Sodium: 139 mmol/L (ref 135–145)

## 2023-03-03 MED ORDER — MAGNESIUM SULFATE 50 % IJ SOLN
6.0000 g | Freq: Once | INTRAVENOUS | Status: AC
  Administered 2023-03-03: 6 g via INTRAVENOUS
  Filled 2023-03-03: qty 10

## 2023-03-03 MED ORDER — MAGNESIUM OXIDE -MG SUPPLEMENT 400 (240 MG) MG PO TABS
400.0000 mg | ORAL_TABLET | Freq: Two times a day (BID) | ORAL | Status: DC
  Administered 2023-03-04 – 2023-03-05 (×3): 400 mg via ORAL
  Filled 2023-03-03 (×3): qty 1

## 2023-03-03 NOTE — Plan of Care (Signed)

## 2023-03-03 NOTE — Progress Notes (Signed)
PROGRESS NOTE    [name]  PPI:[id number] DOB: [birth year] DOA: 02/27/2023 PCP: Default, Provider, MD    Chief Complaint  Patient presents with   Abdominal Pain    Brief Narrative:  Patient 52 year old gentleman history of chronic alcoholism, polysubstance abuse, anemia, depression/anxiety, bipolar disorder, history of suicide attempt, PTSD, OA, chronic bronchitis, chronic lower back pain, chronic pancreatitis, cocaine abuse, GERD, hiatal hernia, hypertension, WPW, sickle cell trait presented to the ED with complaints of right upper quadrant abdominal pain similar to his pancreatitis pain, some burning with dysuria and suprapubic discomfort down to the scrotum area with some urinary incontinence.  Urinalysis done unremarkable.  UDS positive for cocaine and amphetamines.  CRP sed rate normal.  CT abdomen and pelvis done with changes of chronic pancreatitis with pseudocyst formation.  No acute abnormality identified.  MRI of the L-spine with stable lumbar spine degeneration.  Thoracic spine cord T2 signal heterogenicity at the levels of T5-T10.  No thoracic spinal stenosis or neural impingement.  Patient noted to be in AKI, noted to have some urinary retention, Foley catheter placed, electrolytes optimized and pain managed.  Patient noted to be scheduled for EUS with celiac plexus block on 03/02/2023, per GI patient to undergo this procedure in house tomorrow 03/02/2023 if medically stable as patient with difficulty with outpatient follow-up with appointments.   Assessment & Plan:   Principal Problem:   AKI (acute kidney injury) (HCC) Active Problems:   Paroxysmal atrial fibrillation (HCC)   Essential hypertension   GERD (gastroesophageal reflux disease)   Hypomagnesemia   WPW (Wolff-Parkinson-White syndrome)   Acute urinary retention   Hyperkalemia   #1 acute kidney injury/urinary retention -Likely multifactorial secondary to a prerenal azotemia in the setting of  dehydration and likelyalsopostrenal as patient with urinary incontinence and concern for urinary retention. -Foley catheter placed on admission with good urine output of 3.1 L over the past 24 hours. -Renal function improving creatinine down to 0.79 from 1.80 on presentation. -Continue Foley catheter. -Outpatient follow-up with urology for voiding trial and further evaluation.  2.  Paroxysmal A-fib/WPW -Stable. -Optimize electrolytes. -Toprol-XL.    3.  Hypertension -Abdominal pain likely contributing to hypertension.   -Continue Toprol-XL.  4.  GERD -PPI, Pepcid.    5.  Chronic pancreatitis -Patient with complaints of abdominal pain similar to his chronic pancreatitis. -CT abdomen and pelvis done with changes of chronic pancreatitis with pseudocyst formation.  No acute abnormality identified. -Patient currently n.p.o. in anticipation of GI procedure today.   -Hold Creon and resume once placed back on a diet.  -Fentanyl discontinued. -Continue IV Dilaudid for severe pain, oxycodone for moderate and breakthrough pain. -Patient was to have a EUS and celiac plexus block in the outpatient setting, on 03/02/2023. -Contacted by GI who would like to have procedure done during this hospitalization and procedure was to be done 03/02/2023 however due to hyperkalemia procedure canceled and rescheduled for Monday.   -Patient status post Lokelma with resolution of hyperkalemia with potassium at 4.6.   -Continue current soft diet, Creon, pain management.   -Will need to follow-up with GI in the outpatient setting.   6.  Hyperkalemia -Potassium currently at 4.6 this morning status post Lokelma 10 mg 3 times daily.   -DC Lokelma.   -Repeat labs in the AM.    7.  Hypomagnesemia -Magnesium at 1.2. -Magnesium sulfate 6 g IV x 1. -Repeat labs in the AM.   DVT prophylaxis: SCDs.  Code Status: Full Family Communication: Updated  patient.  No family at bedside. Disposition: Likely home when  clinically improved..  Status is: Inpatient The patient will require care spanning > 2 midnights and should be moved to inpatient because: Severity of illness   Consultants:  None  Procedures:  CT abdomen and pelvis 02/28/2023 MRI of the T and L-spine 02/28/2023  Antimicrobials:  Anti-infectives (From admission, onward)    None         Subjective: Patient sitting up on the side of the bed with complaints of chronic upper abdominal pain.  Stated had an episode of emesis overnight.  Denies any emesis today.  Able to tolerate some solid food.    Objective: Vitals:   03/02/23 2214 03/02/23 2255 03/03/23 0531 03/03/23 1258  BP: (!) 203/117 (!) 155/104 133/80 (!) 181/108  Pulse: 75 (!) 102 91 87  Resp:   18 (!) 22  Temp:   98.5 F (36.9 C) 98.1 F (36.7 C)  TempSrc:   Oral Oral  SpO2:  100% 100% 100%  Weight:      Height:        Intake/Output Summary (Last 24 hours) at 03/03/2023 1912 Last data filed at 03/03/2023 1820 Gross per 24 hour  Intake 472 ml  Output 3475 ml  Net -3003 ml   Filed Weights   03/01/23 0245  Weight: 55.9 kg    Examination:  General exam: NAD Respiratory system: CTAB.  No wheezes, no crackles, no rhonchi.  Fair air movement.  Speaking in full sentences.  Cardiovascular system: RRR no murmurs rubs or gallops.  No JVD.  No lower extremity edema.  Gastrointestinal system: Abdomen is soft, nondistended, some diffuse tenderness to palpation especially in the upper abdominal region.  Positive bowel sounds.  No rebound.  No guarding.  Central nervous system: Alert and oriented. No focal neurological deficits. Extremities: Symmetric 5 x 5 power. Skin: No rashes, lesions or ulcers Psychiatry: Judgement and insight appear normal. Mood & affect appropriate.     Data Reviewed: I have personally reviewed following labs and imaging studies  CBC: Recent Labs  Lab 02/27/23 2106 02/27/23 2120 02/28/23 1110 03/01/23 0418 03/02/23 0430  03/03/23 0416  WBC 6.5  --  7.4 8.4 7.1 6.0  NEUTROABS 3.3  --   --   --   --  2.7  HGB 9.6* 12.9* 10.6* 10.4* 8.7* 8.5*  HCT 31.0* 38.0* 34.8* 35.9* 30.0* 29.2*  MCV 84.7  --  86.1 89.1 89.6 90.4  PLT 164  --  172 157 135* 137*    Basic Metabolic Panel: Recent Labs  Lab 02/27/23 2106 02/27/23 2120 02/28/23 1110 03/01/23 0418 03/01/23 0910 03/02/23 0430 03/02/23 0812 03/02/23 1312 03/03/23 0416  NA 137 140 135 139  --  138  --   --  139  K 4.1 4.3 4.4 5.3* 4.9 5.6* 6.1* 5.4* 4.6  CL 106 110 104 109  --  112*  --   --  111  CO2 20*  --  22 21*  --  19*  --   --  20*  GLUCOSE 82 82 94 112*  --  77  --   --  96  BUN 19 19 16 12   --  7  --   --  <5*  CREATININE 1.64* 1.80* 1.25* 0.97  --  0.77  --   --  0.79  CALCIUM 9.7  --  8.9 9.6  --  9.2  --   --  9.0  MG  --   --   --   --   --  1.1*  --   --  1.2*    GFR: Estimated Creatinine Clearance: 84.4 mL/min (by C-G formula based on SCr of 0.79 mg/dL).  Liver Function Tests: Recent Labs  Lab 02/27/23 2106 03/02/23 0430  AST 35 27  ALT 23 17  ALKPHOS 92 67  BILITOT 0.7 0.7  PROT 8.4* 6.9  ALBUMIN 4.5 3.5    CBG: No results for input(s): "GLUCAP" in the last 168 hours.   Recent Results (from the past 240 hour(s))  Resp panel by RT-PCR (RSV, Flu A&B, Covid) Anterior Nasal Swab     Status: None   Collection Time: 02/28/23 12:34 AM   Specimen: Anterior Nasal Swab  Result Value Ref Range Status   SARS Coronavirus 2 by RT PCR NEGATIVE NEGATIVE Final    Comment: (NOTE) SARS-CoV-2 target nucleic acids are NOT DETECTED.  The SARS-CoV-2 RNA is generally detectable in upper respiratory specimens during the acute phase of infection. The lowest concentration of SARS-CoV-2 viral copies this assay can detect is 138 copies/mL. A negative result does not preclude SARS-Cov-2 infection and should not be used as the sole basis for treatment or other patient management decisions. A negative result may occur with  improper  specimen collection/handling, submission of specimen other than nasopharyngeal swab, presence of viral mutation(s) within the areas targeted by this assay, and inadequate number of viral copies(<138 copies/mL). A negative result must be combined with clinical observations, patient history, and epidemiological information. The expected result is Negative.  Fact Sheet for Patients:  BloggerCourse.com  Fact Sheet for Healthcare Providers:  SeriousBroker.it  This test is no t yet approved or cleared by the [place] FDA and  has been authorized for detection and/or diagnosis of SARS-CoV-2 by FDA under an Emergency Use Authorization (EUA). This EUA will remain  in effect (meaning this test can be used) for the duration of the COVID-19 declaration under Section 564(b)(1) of the Act, 21 U.S.C.section 360bbb-3(b)(1), unless the authorization is terminated  or revoked sooner.       Influenza A by PCR NEGATIVE NEGATIVE Final   Influenza B by PCR NEGATIVE NEGATIVE Final    Comment: (NOTE) The Xpert Xpress SARS-CoV-2/FLU/RSV plus assay is intended as an aid in the diagnosis of influenza from Nasopharyngeal swab specimens and should not be used as a sole basis for treatment. Nasal washings and aspirates are unacceptable for Xpert Xpress SARS-CoV-2/FLU/RSV testing.  Fact Sheet for Patients: BloggerCourse.com  Fact Sheet for Healthcare Providers: SeriousBroker.it  This test is not yet approved or cleared by the [place] FDA and has been authorized for detection and/or diagnosis of SARS-CoV-2 by FDA under an Emergency Use Authorization (EUA). This EUA will remain in effect (meaning this test can be used) for the duration of the COVID-19 declaration under Section 564(b)(1) of the Act, 21 U.S.C. section 360bbb-3(b)(1), unless the authorization is terminated or revoked.     Resp  Syncytial Virus by PCR NEGATIVE NEGATIVE Final    Comment: (NOTE) Fact Sheet for Patients: BloggerCourse.com  Fact Sheet for Healthcare Providers: SeriousBroker.it  This test is not yet approved or cleared by the [place] FDA and has been authorized for detection and/or diagnosis of SARS-CoV-2 by FDA under an Emergency Use Authorization (EUA). This EUA will remain in effect (meaning this test can be used) for the duration of the COVID-19 declaration under Section 564(b)(1) of the Act, 21 U.S.C. section 360bbb-3(b)(1), unless the authorization is terminated or revoked.  Performed at [facility], [address]  [name], [place] [address]          Radiology Studies: No results found.      Scheduled Meds:  Chlorhexidine Gluconate Cloth  6 each Topical Daily   famotidine  20 mg Oral BID   fluticasone  2 spray Each Nare Daily   lipase/protease/amylase  72,000 Units Oral TID WC   liver oil-zinc oxide   Topical BID   metoprolol  200 mg Oral Daily   pantoprazole  40 mg Oral BID AC   pregabalin  100 mg Oral BID   sucralfate  1 g Oral TID WC   Continuous Infusions:     LOS: 2 days    Time spent: 40 minutes    Ramiro Harvest, MD [facility]   To contact the attending provider between 7A-7P or the covering provider during after hours 7P-7A, please log into the web site [email or web address] and access using universal [place] password for that web site. If you do not have the password, please call the hospital operator.  03/03/2023, 7:12 PM

## 2023-03-04 DIAGNOSIS — E875 Hyperkalemia: Secondary | ICD-10-CM | POA: Diagnosis not present

## 2023-03-04 DIAGNOSIS — I480 Paroxysmal atrial fibrillation: Secondary | ICD-10-CM | POA: Diagnosis not present

## 2023-03-04 DIAGNOSIS — N179 Acute kidney failure, unspecified: Secondary | ICD-10-CM | POA: Diagnosis not present

## 2023-03-04 LAB — RENAL FUNCTION PANEL
Albumin: 3.4 g/dL — ABNORMAL LOW (ref 3.5–5.0)
Anion gap: 8 (ref 5–15)
BUN: 9 mg/dL (ref 6–20)
CO2: 23 mmol/L (ref 22–32)
Calcium: 8.8 mg/dL — ABNORMAL LOW (ref 8.9–10.3)
Chloride: 103 mmol/L (ref 98–111)
Creatinine, Ser: 0.86 mg/dL (ref 0.61–1.24)
GFR, Estimated: >60 mL/min (ref >60)
Glucose, Bld: 97 mg/dL (ref 70–99)
Phosphorus: 4.6 mg/dL (ref 2.5–4.6)
Potassium: 4.2 mmol/L (ref 3.5–5.1)
Sodium: 134 mmol/L — ABNORMAL LOW (ref 135–145)

## 2023-03-04 LAB — CBC
HCT: 26.2 % — ABNORMAL LOW (ref 39.0–52.0)
Hemoglobin: 8.1 g/dL — ABNORMAL LOW (ref 13.0–17.0)
MCH: 26.6 pg (ref 26.0–34.0)
MCHC: 30.9 g/dL (ref 30.0–36.0)
MCV: 86.2 fL (ref 80.0–100.0)
Platelets: 133 10*3/uL — ABNORMAL LOW (ref 150–400)
RBC: 3.04 MIL/uL — ABNORMAL LOW (ref 4.22–5.81)
RDW: 20.1 % — ABNORMAL HIGH (ref 11.5–15.5)
WBC: 6.3 10*3/uL (ref 4.0–10.5)
nRBC: 0 % (ref 0.0–0.2)

## 2023-03-04 LAB — MAGNESIUM: Magnesium: 1.8 mg/dL (ref 1.7–2.4)

## 2023-03-04 LAB — FENTANYL AND METABOLITE
Fentanyl: NOT DETECTED ng/mL (ref 0.3–1.5)
Norfentanyl: NOT DETECTED ng/mL

## 2023-03-04 MED ORDER — PROCHLORPERAZINE EDISYLATE 10 MG/2ML IJ SOLN: 5.0000 mg | Freq: Four times a day (QID) | INTRAMUSCULAR | Status: DC | PRN

## 2023-03-04 MED ORDER — MAGNESIUM SULFATE 2 GM/50ML IV SOLN
2.0000 g | Freq: Once | INTRAVENOUS | Status: AC
  Administered 2023-03-04: 2 g via INTRAVENOUS
  Filled 2023-03-04: qty 50

## 2023-03-04 NOTE — Progress Notes (Signed)
Patient tried eating his dinner but vomited after. Zofran given. Complained of pain 9/10 and still nauseous. On call A. Virgel Manifold, NP informed. IV dilaudid given earlier as scheduled. Will continue to monitor.

## 2023-03-04 NOTE — Progress Notes (Signed)
PROGRESS NOTE    [name]  ZOX:[id number] DOB: 1970-07-02 DOA: 02/27/2023 PCP: Default, Provider, MD    Chief Complaint  Patient presents with   Abdominal Pain    Brief Narrative:  Patient 52 year old gentleman history of chronic alcoholism, polysubstance abuse, anemia, depression/anxiety, bipolar disorder, history of suicide attempt, PTSD, OA, chronic bronchitis, chronic lower back pain, chronic pancreatitis, cocaine abuse, GERD, hiatal hernia, hypertension, WPW, sickle cell trait presented to the ED with complaints of right upper quadrant abdominal pain similar to his pancreatitis pain, some burning with dysuria and suprapubic discomfort down to the scrotum area with some urinary incontinence.  Urinalysis done unremarkable.  UDS positive for cocaine and amphetamines.  CRP sed rate normal.  CT abdomen and pelvis done with changes of chronic pancreatitis with pseudocyst formation.  No acute abnormality identified.  MRI of the L-spine with stable lumbar spine degeneration.  Thoracic spine cord T2 signal heterogenicity at the levels of T5-T10.  No thoracic spinal stenosis or neural impingement.  Patient noted to be in AKI, noted to have some urinary retention, Foley catheter placed, electrolytes optimized and pain managed.  Patient noted to be scheduled for EUS with celiac plexus block on 03/02/2023, per GI patient to undergo this procedure in house tomorrow 03/02/2023 if medically stable as patient with difficulty with outpatient follow-up with appointments.   Assessment & Plan:   Principal Problem:   AKI (acute kidney injury) (HCC) Active Problems:   Paroxysmal atrial fibrillation (HCC)   Essential hypertension   GERD (gastroesophageal reflux disease)   Hypomagnesemia   WPW (Wolff-Parkinson-White syndrome)   Acute urinary retention   Hyperkalemia   #1 acute kidney injury/urinary retention -Likely multifactorial secondary to a prerenal azotemia in the setting of  dehydration and likelyalsopostrenal as patient with urinary incontinence and concern for urinary retention. -Foley catheter placed on admission with good urine output of 4.5-5 L over the past 24 hours. -Renal function improving creatinine down to 0.86 from 1.80 on presentation. -Continue Foley catheter. -Voiding trial tomorrow. -Outpatient follow-up with urology.  2.  Paroxysmal A-fib/WPW -Stable. -Optimize electrolytes. -Toprol-XL.    3.  Hypertension -Toprol-XL.   4.  GERD -PPI, Pepcid.    5.  Chronic pancreatitis -Patient with complaints of abdominal pain similar to his chronic pancreatitis. -CT abdomen and pelvis done with changes of chronic pancreatitis with pseudocyst formation.  No acute abnormality identified. -Patient currently tolerating clear liquids and diet advanced to a regular diet. -Creon resumed. -Continue IV Dilaudid for severe pain, oxycodone for moderate and breakthrough pain. -Patient was to have a EUS and celiac plexus block in the outpatient setting, on 03/02/2023. -Contacted by GI who would like to have procedure done during this hospitalization and procedure was to be done 03/02/2023 however due to hyperkalemia procedure canceled and rescheduled for Monday.   -Patient status post Lokelma with resolution of hyperkalemia with potassium at 4.2.   -Continue current soft diet, Creon, pain management.   -Will need to follow-up with GI in the outpatient setting.   6.  Hyperkalemia -Potassium currently at 4.2 this morning status post Lokelma 10 mg 3 times daily.   -Lokelma discontinued. -Repeat labs in the AM.    7.  Hypomagnesemia -Magnesium at 1.8 this morning. -Magnesium sulfate 2 g IV x 1.. -Repeat labs in the AM.   DVT prophylaxis: SCDs.  Code Status: Full Family Communication: Updated patient.  No family at bedside. Disposition: Likely home when clinically improved..  Status is: Inpatient The patient will require care  spanning > 2 midnights and  should be moved to inpatient because: Severity of illness   Consultants:  None  Procedures:  CT abdomen and pelvis 02/28/2023 MRI of the T and L-spine 02/28/2023  Antimicrobials:  Anti-infectives (From admission, onward)    None         Subjective: Sleeping but arousable.  Events overnight noted that patient with some nausea and a bout of emesis after eating around 9:30 PM however did not receive any Creon prior to his meal.  Denies any chest pain or shortness of breath.  States abdominal pain currently his chronic pain.  Tolerated clear liquids this morning.   Objective: Vitals:   03/03/23 1258 03/03/23 1942 03/03/23 2145 03/04/23 0423  BP: (!) 181/108 (!) 173/111 (!) 154/78 (!) 155/98  Pulse: 87 92  77  Resp: (!) 22 16  16   Temp: 98.1 F (36.7 C) 99.2 F (37.3 C)  (!) 97.4 F (36.3 C)  TempSrc: Oral Oral  Oral  SpO2: 100% 100%  99%  Weight:      Height:        Intake/Output Summary (Last 24 hours) at 03/04/2023 1228 Last data filed at 03/04/2023 0940 Gross per 24 hour  Intake 712 ml  Output 5325 ml  Net -4613 ml   Filed Weights   03/01/23 0245  Weight: 55.9 kg    Examination:  General exam: NAD Respiratory system: Lungs clear to auscultation bilaterally.  No wheezes, no crackles, no rhonchi.  Fair air movement.  Speaking in full sentences.  Cardiovascular system: Regular rate rhythm no murmurs rubs or gallops.  No JVD.  No pitting lower extremity edema.   Gastrointestinal system: Abdomen soft, nondistended, decreased tenderness to palpation in the upper abdominal region.  Positive bowel sounds.  No rebound.  No guarding.  Central nervous system: Alert and oriented. No focal neurological deficits. Extremities: Symmetric 5 x 5 power. Skin: No rashes, lesions or ulcers Psychiatry: Judgement and insight appear normal. Mood & affect appropriate.     Data Reviewed: I have personally reviewed following labs and imaging studies  CBC: Recent Labs  Lab  02/27/23 2106 02/27/23 2120 02/28/23 1110 03/01/23 0418 03/02/23 0430 03/03/23 0416 03/04/23 0442  WBC 6.5  --  7.4 8.4 7.1 6.0 6.3  NEUTROABS 3.3  --   --   --   --  2.7  --   HGB 9.6*   < > 10.6* 10.4* 8.7* 8.5* 8.1*  HCT 31.0*   < > 34.8* 35.9* 30.0* 29.2* 26.2*  MCV 84.7  --  86.1 89.1 89.6 90.4 86.2  PLT 164  --  172 157 135* 137* 133*   < > = values in this interval not displayed.    Basic Metabolic Panel: Recent Labs  Lab 02/28/23 1110 03/01/23 0418 03/01/23 0910 03/02/23 0430 03/02/23 0812 03/02/23 1312 03/03/23 0416 03/04/23 0442  NA 135 139  --  138  --   --  139 134*  K 4.4 5.3*   < > 5.6* 6.1* 5.4* 4.6 4.2  CL 104 109  --  112*  --   --  111 103  CO2 22 21*  --  19*  --   --  20* 23  GLUCOSE 94 112*  --  77  --   --  96 97  BUN 16 12  --  7  --   --  <5* 9  CREATININE 1.25* 0.97  --  0.77  --   --  0.79 0.86  CALCIUM 8.9 9.6  --  9.2  --   --  9.0 8.8*  MG  --   --   --  1.1*  --   --  1.2* 1.8  PHOS  --   --   --   --   --   --   --  4.6   < > = values in this interval not displayed.    GFR: Estimated Creatinine Clearance: 78.5 mL/min (by C-G formula based on SCr of 0.86 mg/dL).  Liver Function Tests: Recent Labs  Lab 02/27/23 2106 03/02/23 0430 03/04/23 0442  AST 35 27  --   ALT 23 17  --   ALKPHOS 92 67  --   BILITOT 0.7 0.7  --   PROT 8.4* 6.9  --   ALBUMIN 4.5 3.5 3.4*    CBG: No results for input(s): "GLUCAP" in the last 168 hours.   Recent Results (from the past 240 hour(s))  Resp panel by RT-PCR (RSV, Flu A&B, Covid) Anterior Nasal Swab     Status: None   Collection Time: 02/28/23 12:34 AM   Specimen: Anterior Nasal Swab  Result Value Ref Range Status   SARS Coronavirus 2 by RT PCR NEGATIVE NEGATIVE Final    Comment: (NOTE) SARS-CoV-2 target nucleic acids are NOT DETECTED.  The SARS-CoV-2 RNA is generally detectable in upper respiratory specimens during the acute phase of infection. The lowest concentration of SARS-CoV-2  viral copies this assay can detect is 138 copies/mL. A negative result does not preclude SARS-Cov-2 infection and should not be used as the sole basis for treatment or other patient management decisions. A negative result may occur with  improper specimen collection/handling, submission of specimen other than nasopharyngeal swab, presence of viral mutation(s) within the areas targeted by this assay, and inadequate number of viral copies(<138 copies/mL). A negative result must be combined with clinical observations, patient history, and epidemiological information. The expected result is Negative.  Fact Sheet for Patients:  BloggerCourse.com  Fact Sheet for Healthcare Providers:  SeriousBroker.it  This test is no t yet approved or cleared by the [place] FDA and  has been authorized for detection and/or diagnosis of SARS-CoV-2 by FDA under an Emergency Use Authorization (EUA). This EUA will remain  in effect (meaning this test can be used) for the duration of the COVID-19 declaration under Section 564(b)(1) of the Act, 21 U.S.C.section 360bbb-3(b)(1), unless the authorization is terminated  or revoked sooner.       Influenza A by PCR NEGATIVE NEGATIVE Final   Influenza B by PCR NEGATIVE NEGATIVE Final    Comment: (NOTE) The Xpert Xpress SARS-CoV-2/FLU/RSV plus assay is intended as an aid in the diagnosis of influenza from Nasopharyngeal swab specimens and should not be used as a sole basis for treatment. Nasal washings and aspirates are unacceptable for Xpert Xpress SARS-CoV-2/FLU/RSV testing.  Fact Sheet for Patients: BloggerCourse.com  Fact Sheet for Healthcare Providers: SeriousBroker.it  This test is not yet approved or cleared by the [place] FDA and has been authorized for detection and/or diagnosis of SARS-CoV-2 by FDA under an Emergency Use Authorization  (EUA). This EUA will remain in effect (meaning this test can be used) for the duration of the COVID-19 declaration under Section 564(b)(1) of the Act, 21 U.S.C. section 360bbb-3(b)(1), unless the authorization is terminated or revoked.     Resp Syncytial Virus by PCR NEGATIVE NEGATIVE Final    Comment: (NOTE) Fact Sheet for Patients: BloggerCourse.com  Fact Sheet for Healthcare  Providers: SeriousBroker.it  This test is not yet approved or cleared by the [place] and has been authorized for detection and/or diagnosis of SARS-CoV-2 by FDA under an Emergency Use Authorization (EUA). This EUA will remain in effect (meaning this test can be used) for the duration of the COVID-19 declaration under Section 564(b)(1) of the Act, 21 U.S.C. section 360bbb-3(b)(1), unless the authorization is terminated or revoked.  Performed at [facility], [address] [place], [place] [address]          Radiology Studies: No results found.      Scheduled Meds:  Chlorhexidine Gluconate Cloth  6 each Topical Daily   famotidine  20 mg Oral BID   fluticasone  2 spray Each Nare Daily   lipase/protease/amylase  72,000 Units Oral TID WC   liver oil-zinc oxide   Topical BID   magnesium oxide  400 mg Oral BID   metoprolol  200 mg Oral Daily   pantoprazole  40 mg Oral BID AC   pregabalin  100 mg Oral BID   sucralfate  1 g Oral TID WC   Continuous Infusions:     LOS: 3 days    Time spent: 35 minutes    Ramiro Harvest, MD [facility]   To contact the attending provider between 7A-7P or the covering provider during after hours 7P-7A, please log into the web site [email or web address] and access using universal [place] password for that web site. If you do not have the password, please call the hospital operator.  03/04/2023, 12:28 PM

## 2023-03-05 DIAGNOSIS — I456 Pre-excitation syndrome: Secondary | ICD-10-CM | POA: Diagnosis not present

## 2023-03-05 DIAGNOSIS — N179 Acute kidney failure, unspecified: Secondary | ICD-10-CM | POA: Diagnosis not present

## 2023-03-05 DIAGNOSIS — I480 Paroxysmal atrial fibrillation: Secondary | ICD-10-CM | POA: Diagnosis not present

## 2023-03-05 DIAGNOSIS — I10 Essential (primary) hypertension: Secondary | ICD-10-CM | POA: Diagnosis not present

## 2023-03-05 LAB — CBC
HCT: 29.6 % — ABNORMAL LOW (ref 39.0–52.0)
Hemoglobin: 8.8 g/dL — ABNORMAL LOW (ref 13.0–17.0)
MCH: 26.2 pg (ref 26.0–34.0)
MCHC: 29.7 g/dL — ABNORMAL LOW (ref 30.0–36.0)
MCV: 88.1 fL (ref 80.0–100.0)
Platelets: 154 10*3/uL (ref 150–400)
RBC: 3.36 MIL/uL — ABNORMAL LOW (ref 4.22–5.81)
RDW: 20.5 % — ABNORMAL HIGH (ref 11.5–15.5)
WBC: 8.4 10*3/uL (ref 4.0–10.5)
nRBC: 0 % (ref 0.0–0.2)

## 2023-03-05 LAB — BASIC METABOLIC PANEL
Anion gap: 9 (ref 5–15)
BUN: 11 mg/dL (ref 6–20)
CO2: 24 mmol/L (ref 22–32)
Calcium: 8.9 mg/dL (ref 8.9–10.3)
Chloride: 101 mmol/L (ref 98–111)
Creatinine, Ser: 0.87 mg/dL (ref 0.61–1.24)
GFR, Estimated: >60 mL/min (ref >60)
Glucose, Bld: 194 mg/dL — ABNORMAL HIGH (ref 70–99)
Potassium: 4.3 mmol/L (ref 3.5–5.1)
Sodium: 134 mmol/L — ABNORMAL LOW (ref 135–145)

## 2023-03-05 LAB — MAGNESIUM: Magnesium: 1.3 mg/dL — ABNORMAL LOW (ref 1.7–2.4)

## 2023-03-05 MED ORDER — MAGNESIUM OXIDE -MG SUPPLEMENT 400 (240 MG) MG PO TABS
800.0000 mg | ORAL_TABLET | Freq: Two times a day (BID) | ORAL | Status: DC
  Administered 2023-03-05 – 2023-03-07 (×4): 800 mg via ORAL
  Filled 2023-03-05 (×4): qty 2

## 2023-03-05 MED ORDER — MAGNESIUM SULFATE 50 % IJ SOLN: 6.0000 g | Freq: Once | INTRAVENOUS | Status: DC

## 2023-03-05 MED ORDER — ALUM & MAG HYDROXIDE-SIMETH 200-200-20 MG/5ML PO SUSP
30.0000 mL | Freq: Once | ORAL | Status: AC
  Administered 2023-03-05: 30 mL via ORAL
  Filled 2023-03-05: qty 30

## 2023-03-05 MED ORDER — MAGNESIUM SULFATE 4 GM/100ML IV SOLN
4.0000 g | Freq: Once | INTRAVENOUS | Status: AC
  Administered 2023-03-05: 4 g via INTRAVENOUS
  Filled 2023-03-05: qty 100

## 2023-03-05 MED ORDER — TAMSULOSIN HCL 0.4 MG PO CAPS
0.4000 mg | ORAL_CAPSULE | Freq: Every day | ORAL | Status: DC
  Administered 2023-03-05 – 2023-03-07 (×3): 0.4 mg via ORAL
  Filled 2023-03-05 (×3): qty 1

## 2023-03-05 MED ORDER — LIDOCAINE VISCOUS HCL 2 % MT SOLN
15.0000 mL | Freq: Once | OROMUCOSAL | Status: AC
  Administered 2023-03-05: 15 mL via ORAL
  Filled 2023-03-05 (×2): qty 15

## 2023-03-05 MED ORDER — MAGNESIUM SULFATE 2 GM/50ML IV SOLN
2.0000 g | Freq: Once | INTRAVENOUS | Status: AC
  Administered 2023-03-05: 2 g via INTRAVENOUS
  Filled 2023-03-05: qty 50

## 2023-03-05 NOTE — Progress Notes (Signed)
PROGRESS NOTE    [name]  LKG:[id number] DOB: 09-13-1970 DOA: 02/27/2023 PCP: Default, Provider, MD    Chief Complaint  Patient presents with   Abdominal Pain    Brief Narrative:  Patient 52 year old gentleman history of chronic alcoholism, polysubstance abuse, anemia, depression/anxiety, bipolar disorder, history of suicide attempt, PTSD, OA, chronic bronchitis, chronic lower back pain, chronic pancreatitis, cocaine abuse, GERD, hiatal hernia, hypertension, WPW, sickle cell trait presented to the ED with complaints of right upper quadrant abdominal pain similar to his pancreatitis pain, some burning with dysuria and suprapubic discomfort down to the scrotum area with some urinary incontinence.  Urinalysis done unremarkable.  UDS positive for cocaine and amphetamines.  CRP sed rate normal.  CT abdomen and pelvis done with changes of chronic pancreatitis with pseudocyst formation.  No acute abnormality identified.  MRI of the L-spine with stable lumbar spine degeneration.  Thoracic spine cord T2 signal heterogenicity at the levels of T5-T10.  No thoracic spinal stenosis or neural impingement.  Patient noted to be in AKI, noted to have some urinary retention, Foley catheter placed, electrolytes optimized and pain managed.  Patient noted to be scheduled for EUS with celiac plexus block on 03/02/2023, per GI patient to undergo this procedure in house tomorrow 03/02/2023 if medically stable as patient with difficulty with outpatient follow-up with appointments.   Assessment & Plan:   Principal Problem:   AKI (acute kidney injury) (HCC) Active Problems:   Paroxysmal atrial fibrillation (HCC)   Essential hypertension   GERD (gastroesophageal reflux disease)   Hypomagnesemia   WPW (Wolff-Parkinson-White syndrome)   Acute urinary retention   Hyperkalemia   #1 acute kidney injury/urinary retention -Likely multifactorial secondary to a prerenal azotemia in the setting of  dehydration and likelyalsopostrenal as patient with urinary incontinence and concern for urinary retention. -Foley catheter placed on admission with good urine output of 3.3 L over the past 24 hours. -Renal function improving creatinine down to 0.87 from 1.80 on presentation. -Start Flomax 0.4 mg daily for urinary retention. -Voiding trial. -If fails voiding trial Foley catheter to be placed back in with outpatient follow-up with urology.  2.  Paroxysmal A-fib/WPW -Stable. -Optimize electrolytes. -Toprol-XL.    3.  Hypertension -Toprol-XL.   4.  GERD -Continue PPI, Pepcid.   -GI cocktail x 1.   5.  Chronic pancreatitis -Patient with complaints of abdominal pain similar to his chronic pancreatitis. -CT abdomen and pelvis done with changes of chronic pancreatitis with pseudocyst formation.  No acute abnormality identified. -Patient currently tolerating clear liquids and diet advanced to a regular diet. -Creon resumed. -Continue IV Dilaudid for severe pain, oxycodone for moderate and breakthrough pain. -Patient was to have a EUS and celiac plexus block in the outpatient setting, on 03/02/2023. -Contacted by GI who would like to have procedure done during this hospitalization and procedure was to be done 03/02/2023 however due to hyperkalemia procedure canceled and rescheduled for Monday.   -Patient status post Lokelma with resolution of hyperkalemia with potassium at 4.3.   -Continue current diet, Creon, pain management.   -Will need to follow-up with GI in the outpatient setting.   6.  Hyperkalemia -Potassium currently at 4.3 this morning status post Lokelma 10 mg 3 times daily.   -Lokelma discontinued. -Repeat labs in the AM.    7.  Hypomagnesemia -Magnesium at 1.3 this morning.   -Magnesium sulfate 6 g IV x 1. -Magnesium oxide 800 mg p.o. twice daily.  -Repeat labs in the AM.  DVT prophylaxis: SCDs.  Code Status: Full Family Communication: Updated patient.  No family at  bedside. Disposition: Likely home when clinically improved and post GI procedure...  Status is: Inpatient The patient will require care spanning > 2 midnights and should be moved to inpatient because: Severity of illness   Consultants:  None  Procedures:  CT abdomen and pelvis 02/28/2023 MRI of the T and L-spine 02/28/2023  Antimicrobials:  Anti-infectives (From admission, onward)    None         Subjective: Patient sitting up washing himself.  Denies any chest pain or shortness of breath.  Still with some abdominal discomfort and nausea.  No emesis.  Tolerated breakfast this morning.  Patient with some complaints of heartburn and asking for GI cocktail.    Objective: Vitals:   03/04/23 0423 03/04/23 1235 03/04/23 2033 03/05/23 0428  BP: (!) 155/98 (!) 132/94 (!) 158/93 (!) 137/98  Pulse: 77 79 90 82  Resp: 16  16 16   Temp: (!) 97.4 F (36.3 C) 98.1 F (36.7 C) 98.4 F (36.9 C) 98.4 F (36.9 C)  TempSrc: Oral Oral Oral Oral  SpO2: 99% 97% 100% 100%  Weight:      Height:        Intake/Output Summary (Last 24 hours) at 03/05/2023 1140 Last data filed at 03/05/2023 1045 Gross per 24 hour  Intake 920 ml  Output 2600 ml  Net -1680 ml   Filed Weights   03/01/23 0245  Weight: 55.9 kg    Examination:  General exam: NAD Respiratory system: CTAB.  No wheezes, no crackles, no rhonchi.  Fair air movement.  Speaking in full sentences.   Cardiovascular system: RRR no murmurs rubs or gallops.  No JVD.  No lower extremity edema.  Gastrointestinal system: Abdomen soft, nondistended, decreased tenderness to palpation in the upper abdominal region.  Positive bowel sounds.  No rebound.  No guarding.  Central nervous system: Alert and oriented. No focal neurological deficits. Extremities: Symmetric 5 x 5 power. Skin: No rashes, lesions or ulcers Psychiatry: Judgement and insight appear normal. Mood & affect appropriate.     Data Reviewed: I have personally reviewed  following labs and imaging studies  CBC: Recent Labs  Lab 02/27/23 2106 02/27/23 2120 03/01/23 0418 03/02/23 0430 03/03/23 0416 03/04/23 0442 03/05/23 0807  WBC 6.5   < > 8.4 7.1 6.0 6.3 8.4  NEUTROABS 3.3  --   --   --  2.7  --   --   HGB 9.6*   < > 10.4* 8.7* 8.5* 8.1* 8.8*  HCT 31.0*   < > 35.9* 30.0* 29.2* 26.2* 29.6*  MCV 84.7   < > 89.1 89.6 90.4 86.2 88.1  PLT 164   < > 157 135* 137* 133* 154   < > = values in this interval not displayed.    Basic Metabolic Panel: Recent Labs  Lab 03/01/23 0418 03/01/23 0910 03/02/23 0430 03/02/23 0812 03/02/23 1312 03/03/23 0416 03/04/23 0442 03/05/23 0807  NA 139  --  138  --   --  139 134* 134*  K 5.3*   < > 5.6* 6.1* 5.4* 4.6 4.2 4.3  CL 109  --  112*  --   --  111 103 101  CO2 21*  --  19*  --   --  20* 23 24  GLUCOSE 112*  --  77  --   --  96 97 194*  BUN 12  --  7  --   --  <  5* 9 11  CREATININE 0.97  --  0.77  --   --  0.79 0.86 0.87  CALCIUM 9.6  --  9.2  --   --  9.0 8.8* 8.9  MG  --   --  1.1*  --   --  1.2* 1.8 1.3*  PHOS  --   --   --   --   --   --  4.6  --    < > = values in this interval not displayed.    GFR: Estimated Creatinine Clearance: 77.6 mL/min (by C-G formula based on SCr of 0.87 mg/dL).  Liver Function Tests: Recent Labs  Lab 02/27/23 2106 03/02/23 0430 03/04/23 0442  AST 35 27  --   ALT 23 17  --   ALKPHOS 92 67  --   BILITOT 0.7 0.7  --   PROT 8.4* 6.9  --   ALBUMIN 4.5 3.5 3.4*    CBG: No results for input(s): "GLUCAP" in the last 168 hours.   Recent Results (from the past 240 hour(s))  Resp panel by RT-PCR (RSV, Flu A&B, Covid) Anterior Nasal Swab     Status: None   Collection Time: 02/28/23 12:34 AM   Specimen: Anterior Nasal Swab  Result Value Ref Range Status   SARS Coronavirus 2 by RT PCR NEGATIVE NEGATIVE Final    Comment: (NOTE) SARS-CoV-2 target nucleic acids are NOT DETECTED.  The SARS-CoV-2 RNA is generally detectable in upper respiratory specimens during the  acute phase of infection. The lowest concentration of SARS-CoV-2 viral copies this assay can detect is 138 copies/mL. A negative result does not preclude SARS-Cov-2 infection and should not be used as the sole basis for treatment or other patient management decisions. A negative result may occur with  improper specimen collection/handling, submission of specimen other than nasopharyngeal swab, presence of viral mutation(s) within the areas targeted by this assay, and inadequate number of viral copies(<138 copies/mL). A negative result must be combined with clinical observations, patient history, and epidemiological information. The expected result is Negative.  Fact Sheet for Patients:  BloggerCourse.com  Fact Sheet for Healthcare Providers:  SeriousBroker.it  This test is no t yet approved or cleared by the [place] FDA and  has been authorized for detection and/or diagnosis of SARS-CoV-2 by FDA under an Emergency Use Authorization (EUA). This EUA will remain  in effect (meaning this test can be used) for the duration of the COVID-19 declaration under Section 564(b)(1) of the Act, 21 U.S.C.section 360bbb-3(b)(1), unless the authorization is terminated  or revoked sooner.       Influenza A by PCR NEGATIVE NEGATIVE Final   Influenza B by PCR NEGATIVE NEGATIVE Final    Comment: (NOTE) The Xpert Xpress SARS-CoV-2/FLU/RSV plus assay is intended as an aid in the diagnosis of influenza from Nasopharyngeal swab specimens and should not be used as a sole basis for treatment. Nasal washings and aspirates are unacceptable for Xpert Xpress SARS-CoV-2/FLU/RSV testing.  Fact Sheet for Patients: BloggerCourse.com  Fact Sheet for Healthcare Providers: SeriousBroker.it  This test is not yet approved or cleared by the [place] FDA and has been authorized for detection and/or  diagnosis of SARS-CoV-2 by FDA under an Emergency Use Authorization (EUA). This EUA will remain in effect (meaning this test can be used) for the duration of the COVID-19 declaration under Section 564(b)(1) of the Act, 21 U.S.C. section 360bbb-3(b)(1), unless the authorization is terminated or revoked.     Resp Syncytial Virus by PCR  NEGATIVE NEGATIVE Final    Comment: (NOTE) Fact Sheet for Patients: BloggerCourse.com  Fact Sheet for Healthcare Providers: SeriousBroker.it  This test is not yet approved or cleared by the [place] FDA and has been authorized for detection and/or diagnosis of SARS-CoV-2 by FDA under an Emergency Use Authorization (EUA). This EUA will remain in effect (meaning this test can be used) for the duration of the COVID-19 declaration under Section 564(b)(1) of the Act, 21 U.S.C. section 360bbb-3(b)(1), unless the authorization is terminated or revoked.  Performed at [facility], [address], [place], [place] [address]          Radiology Studies: No results found.      Scheduled Meds:  Chlorhexidine Gluconate Cloth  6 each Topical Daily   famotidine  20 mg Oral BID   fluticasone  2 spray Each Nare Daily   lipase/protease/amylase  72,000 Units Oral TID WC   liver oil-zinc oxide   Topical BID   magnesium oxide  400 mg Oral BID   metoprolol  200 mg Oral Daily   pantoprazole  40 mg Oral BID AC   pregabalin  100 mg Oral BID   sucralfate  1 g Oral TID WC   tamsulosin  0.4 mg Oral Daily   Continuous Infusions:     LOS: 4 days    Time spent: 35 minutes    Ramiro Harvest, MD [facility]   To contact the attending provider between 7A-7P or the covering provider during after hours 7P-7A, please log into the web site [email or web address] and access using universal [place] password for that web site. If you do not have the password, please call the hospital  operator.  03/05/2023, 11:40 AM

## 2023-03-06 ENCOUNTER — Inpatient Hospital Stay (HOSPITAL_COMMUNITY): Payer: MEDICAID | Admitting: Anesthesiology

## 2023-03-06 ENCOUNTER — Encounter (HOSPITAL_COMMUNITY): Payer: Self-pay | Admitting: Internal Medicine

## 2023-03-06 ENCOUNTER — Ambulatory Visit: Payer: MEDICAID | Admitting: Student

## 2023-03-06 ENCOUNTER — Encounter (HOSPITAL_COMMUNITY)
Admission: EM | Disposition: A | Discharge status: Home / Self Care | Payer: Self-pay | Source: Home / Self Care | Attending: Internal Medicine

## 2023-03-06 DIAGNOSIS — K860 Alcohol-induced chronic pancreatitis: Secondary | ICD-10-CM | POA: Diagnosis not present

## 2023-03-06 DIAGNOSIS — I456 Pre-excitation syndrome: Secondary | ICD-10-CM | POA: Diagnosis not present

## 2023-03-06 DIAGNOSIS — I10 Essential (primary) hypertension: Secondary | ICD-10-CM | POA: Diagnosis not present

## 2023-03-06 DIAGNOSIS — F17210 Nicotine dependence, cigarettes, uncomplicated: Secondary | ICD-10-CM

## 2023-03-06 DIAGNOSIS — I4891 Unspecified atrial fibrillation: Secondary | ICD-10-CM

## 2023-03-06 DIAGNOSIS — K2980 Duodenitis without bleeding: Secondary | ICD-10-CM | POA: Diagnosis not present

## 2023-03-06 DIAGNOSIS — K766 Portal hypertension: Secondary | ICD-10-CM

## 2023-03-06 DIAGNOSIS — I480 Paroxysmal atrial fibrillation: Secondary | ICD-10-CM | POA: Diagnosis not present

## 2023-03-06 DIAGNOSIS — K862 Cyst of pancreas: Secondary | ICD-10-CM | POA: Diagnosis not present

## 2023-03-06 DIAGNOSIS — K31811 Angiodysplasia of stomach and duodenum with bleeding: Secondary | ICD-10-CM | POA: Diagnosis not present

## 2023-03-06 DIAGNOSIS — K31819 Angiodysplasia of stomach and duodenum without bleeding: Secondary | ICD-10-CM | POA: Diagnosis not present

## 2023-03-06 DIAGNOSIS — K5520 Angiodysplasia of colon without hemorrhage: Secondary | ICD-10-CM

## 2023-03-06 DIAGNOSIS — N179 Acute kidney failure, unspecified: Secondary | ICD-10-CM | POA: Diagnosis not present

## 2023-03-06 HISTORY — PX: BIOPSY: SHX5522

## 2023-03-06 HISTORY — PX: ESOPHAGOGASTRODUODENOSCOPY: SHX5428

## 2023-03-06 HISTORY — PX: FINE NEEDLE ASPIRATION: SHX5430

## 2023-03-06 HISTORY — PX: HOT HEMOSTASIS: SHX5433

## 2023-03-06 HISTORY — PX: EUS: SHX5427

## 2023-03-06 LAB — CBC
HCT: 25.8 % — ABNORMAL LOW (ref 39.0–52.0)
Hemoglobin: 7.8 g/dL — ABNORMAL LOW (ref 13.0–17.0)
MCH: 25.6 pg — ABNORMAL LOW (ref 26.0–34.0)
MCHC: 30.2 g/dL (ref 30.0–36.0)
MCV: 84.6 fL (ref 80.0–100.0)
Platelets: 132 10*3/uL — ABNORMAL LOW (ref 150–400)
RBC: 3.05 MIL/uL — ABNORMAL LOW (ref 4.22–5.81)
RDW: 19.9 % — ABNORMAL HIGH (ref 11.5–15.5)
WBC: 7.9 10*3/uL (ref 4.0–10.5)
nRBC: 0 % (ref 0.0–0.2)

## 2023-03-06 LAB — BASIC METABOLIC PANEL
Anion gap: 9 (ref 5–15)
BUN: 13 mg/dL (ref 6–20)
CO2: 24 mmol/L (ref 22–32)
Calcium: 8.9 mg/dL (ref 8.9–10.3)
Chloride: 103 mmol/L (ref 98–111)
Creatinine, Ser: 0.55 mg/dL — ABNORMAL LOW (ref 0.61–1.24)
GFR, Estimated: >60 mL/min (ref >60)
Glucose, Bld: 157 mg/dL — ABNORMAL HIGH (ref 70–99)
Potassium: 3.9 mmol/L (ref 3.5–5.1)
Sodium: 136 mmol/L (ref 135–145)

## 2023-03-06 LAB — MAGNESIUM: Magnesium: 1.6 mg/dL — ABNORMAL LOW (ref 1.7–2.4)

## 2023-03-06 SURGERY — UPPER ENDOSCOPIC ULTRASOUND (EUS) RADIAL
Anesthesia: Monitor Anesthesia Care

## 2023-03-06 MED ORDER — MAGNESIUM SULFATE 4 GM/100ML IV SOLN
4.0000 g | Freq: Once | INTRAVENOUS | Status: AC
  Administered 2023-03-06: 4 g via INTRAVENOUS
  Filled 2023-03-06: qty 100

## 2023-03-06 MED ORDER — PROPOFOL 10 MG/ML IV BOLUS
INTRAVENOUS | Status: AC
Start: 2023-03-06 — End: ?
  Filled 2023-03-06: qty 20

## 2023-03-06 MED ORDER — CIPROFLOXACIN IN D5W 400 MG/200ML IV SOLN
INTRAVENOUS | Status: DC | PRN
  Administered 2023-03-06: 400 mg via INTRAVENOUS

## 2023-03-06 MED ORDER — MAGNESIUM SULFATE 50 % IJ SOLN: 6.0000 g | Freq: Once | INTRAVENOUS | Status: DC

## 2023-03-06 MED ORDER — GLYCOPYRROLATE PF 0.2 MG/ML IJ SOSY
PREFILLED_SYRINGE | INTRAMUSCULAR | Status: DC | PRN
  Administered 2023-03-06: .2 mg via INTRAVENOUS

## 2023-03-06 MED ORDER — BUPIVACAINE HCL 0.25 % IJ SOLN: 20.0000 mL | Freq: Once | INTRAMUSCULAR | Status: DC

## 2023-03-06 MED ORDER — TRIAMCINOLONE ACETONIDE 40 MG/ML IJ SUSP
INTRAMUSCULAR | Status: AC
  Filled 2023-03-06: qty 2

## 2023-03-06 MED ORDER — LIDOCAINE 2% (20 MG/ML) 5 ML SYRINGE
INTRAMUSCULAR | Status: DC | PRN
  Administered 2023-03-06: 65 mg via INTRAVENOUS

## 2023-03-06 MED ORDER — SODIUM CHLORIDE 0.9 % IV SOLN: INTRAVENOUS | Status: DC

## 2023-03-06 MED ORDER — PROPOFOL 500 MG/50ML IV EMUL
INTRAVENOUS | Status: DC | PRN
  Administered 2023-03-06: 125 ug/kg/min via INTRAVENOUS
  Administered 2023-03-06: 100 mg via INTRAVENOUS

## 2023-03-06 MED ORDER — PROPOFOL 1000 MG/100ML IV EMUL
INTRAVENOUS | Status: AC
Start: 2023-03-06 — End: ?
  Filled 2023-03-06: qty 100

## 2023-03-06 MED ORDER — MAGNESIUM SULFATE 2 GM/50ML IV SOLN
2.0000 g | Freq: Once | INTRAVENOUS | Status: AC
  Administered 2023-03-06: 2 g via INTRAVENOUS
  Filled 2023-03-06: qty 50

## 2023-03-06 MED ORDER — CIPROFLOXACIN IN D5W 400 MG/200ML IV SOLN
INTRAVENOUS | Status: AC
Start: 2023-03-06 — End: ?
  Filled 2023-03-06: qty 200

## 2023-03-06 MED ORDER — PHENYLEPHRINE 80 MCG/ML (10ML) SYRINGE FOR IV PUSH (FOR BLOOD PRESSURE SUPPORT)
PREFILLED_SYRINGE | INTRAVENOUS | Status: DC | PRN
  Administered 2023-03-06: 100 ug via INTRAVENOUS
  Administered 2023-03-06: 80 ug via INTRAVENOUS
  Administered 2023-03-06: 100 ug via INTRAVENOUS
  Administered 2023-03-06 (×3): 80 ug via INTRAVENOUS

## 2023-03-06 MED ORDER — BUPIVACAINE HCL (PF) 0.25 % IJ SOLN
20.0000 mL | Freq: Once | INTRAMUSCULAR | Status: DC
  Filled 2023-03-06: qty 20

## 2023-03-06 MED ORDER — CIPROFLOXACIN HCL 500 MG PO TABS
500.0000 mg | ORAL_TABLET | Freq: Two times a day (BID) | ORAL | Status: DC
  Administered 2023-03-06 – 2023-03-07 (×2): 500 mg via ORAL
  Filled 2023-03-06 (×3): qty 1

## 2023-03-06 MED ORDER — ONDANSETRON HCL 4 MG/2ML IJ SOLN
INTRAMUSCULAR | Status: DC | PRN
  Administered 2023-03-06: 4 mg via INTRAVENOUS

## 2023-03-06 NOTE — Plan of Care (Signed)
  Problem: Health Behavior/Discharge Planning: Goal: Ability to manage health-related needs will improve Outcome: Progressing   Problem: Clinical Measurements: Goal: Ability to maintain clinical measurements within normal limits will improve Outcome: Progressing Goal: Will remain free from infection Outcome: Progressing Goal: Diagnostic test results will improve Outcome: Progressing Goal: Cardiovascular complication will be avoided Outcome: Progressing   Problem: Pain Management: Goal: General experience of comfort will improve Outcome: Progressing   Problem: Education: Goal: Knowledge of General Education information will improve Description: Including pain rating scale, medication(s)/side effects and non-pharmacologic comfort measures Outcome: Adequate for Discharge   Problem: Clinical Measurements: Goal: Respiratory complications will improve Outcome: Adequate for Discharge   Problem: Activity: Goal: Risk for activity intolerance will decrease Outcome: Adequate for Discharge   Problem: Nutrition: Goal: Adequate nutrition will be maintained Outcome: Adequate for Discharge   Problem: Coping: Goal: Level of anxiety will decrease Outcome: Adequate for Discharge   Problem: Elimination: Goal: Will not experience complications related to bowel motility Outcome: Adequate for Discharge Goal: Will not experience complications related to urinary retention Outcome: Adequate for Discharge   Problem: Safety: Goal: Ability to remain free from injury will improve Outcome: Adequate for Discharge   Problem: Skin Integrity: Goal: Risk for impaired skin integrity will decrease Outcome: Adequate for Discharge

## 2023-03-06 NOTE — Plan of Care (Signed)
  Problem: Education: Goal: Knowledge of General Education information will improve Description: Including pain rating scale, medication(s)/side effects and non-pharmacologic comfort measures Outcome: Progressing   Problem: Coping: Goal: Level of anxiety will decrease Outcome: Progressing   Problem: Pain Management: Goal: General experience of comfort will improve Outcome: Progressing   Problem: Safety: Goal: Ability to remain free from injury will improve Outcome: Progressing

## 2023-03-06 NOTE — Op Note (Addendum)
[facility] Patient Name: [name] Procedure Date: 03/06/2023 MRN: [id number] Attending MD: Corliss Parish , MD, [id number] Date of Birth: February 03, 1971 CSN: [id number] Age: 52 Admit Type: Inpatient Procedure:                Upper EUS Indications:              Pancreatic cyst on CT scan, Elevated CA 19-9,                            Generalized abdominal pain, Celiac plexus block for                            pain secondary to chronic pancreatitis Providers:                Corliss Parish, MD, Lorenza Evangelist, RN, Harrington Challenger, Technician Referring MD:             Willaim Rayas. Adela Lank, MD, Quentin Mulling, Inpatient                            Medical Service Medicines:                Monitored Anesthesia Care, Cipro 400 mg IV Complications:            No immediate complications. Estimated Blood Loss:     Estimated blood loss was minimal. Procedure:                Pre-Anesthesia Assessment:                           - Prior to the procedure, a History and Physical                            was performed, and patient medications and                            allergies were reviewed. The patient's tolerance of                            previous anesthesia was also reviewed. The risks                            and benefits of the procedure and the sedation                            options and risks were discussed with the patient.                            All questions were answered, and informed consent                            was obtained. Prior Anticoagulants: The patient has  taken no anticoagulant or antiplatelet agents. ASA                            Grade Assessment: III - A patient with severe                            systemic disease. After reviewing the risks and                            benefits, the patient was deemed in satisfactory                            condition to undergo the  procedure.                           After obtaining informed consent, the endoscope was                            passed under direct vision. Throughout the                            procedure, the patient's blood pressure, pulse, and                            oxygen saturations were monitored continuously. The                            [id number]-H190 ([id number] Olympus endoscope was introduced                            through the mouth, and advanced to the second part                            of duodenum. The [id number]-[id number] ([id number]) Olympus                            duodenoscope was introduced through the mouth, and                            advanced to the second part of duodenum. The                            [id number] ([id number]) Olympus linear ultrasound scope                            was introduced through the mouth, and advanced to                            the duodenum for ultrasound examination from the                            stomach and duodenum. The upper EUS was  accomplished without difficulty. The patient                            tolerated the procedure. Scope In: Scope Out: Findings:      ENDOSCOPIC FINDING: :      The examined esophagus was mildly tortuous.      The Z-line was irregular and was found 45 cm from the incisors.      A 1 cm hiatal hernia was present.      Mild portal hypertensive gastropathy was found in the cardia, in the       gastric fundus and in the gastric body.      Three small angioectasias with no bleeding were found in the gastric       body. Fulguration to ablate the lesion to prevent bleeding by argon       plasma was successful.      No gross lesions were noted in the entire examined stomach.      A single small angioectasia without bleeding was found in the second       portion of the duodenum. Fulguration to ablate the lesion to prevent       bleeding by argon plasma was successful.      Diffuse moderately  congested mucosa without active bleeding and with no       stigmata of bleeding was found in the duodenal bulb, in the first       portion of the duodenum and in the second portion of the duodenum.       Biopsies were taken with a cold forceps for histology and Helicobacter       pylori testing.      Changes at the major papillary region, suggestive of either previous       ERCP with what appears to be prior sphincterotomy at both the biliary       and pancreatic regions (though chart suggests he has never had).      ENDOSONOGRAPHIC FINDING: :      An anechoic lesion suggestive of a cyst was identified in the pancreatic       tail. It is not in obvious communication with the pancreatic duct. The       lesion measured 41 mm by 38 mm in maximal cross-sectional diameter.       There was a single compartment without septae. The outer wall of the       lesion was thin. There was no associated mass. There was no internal       debris within the fluid-filled cavity. Diagnostic needle aspiration for       fluid was performed. Color Doppler imaging was utilized prior to needle       puncture to confirm a lack of significant vascular structures within the       needle path. One pass was made with the 22 gauge needle using a       transgastric approach. A stylet was used. The amount of fluid collected       was 30 mL. The fluid was cloudy, yellow and slightly viscous. Samples       were sent for glucose, amylase concentration, cytology and CEA.      Pancreatic parenchymal abnormalities were noted in the entire pancreas.       These consisted of hyperechoic foci with shadowing and lobularity with       honeycombing.  The pancreatic duct had a tortuous/ectatic appearance in the pancreatic       head (PD - 1.7 mm), genu of the pancreas (PD - 2.4 mm), body of the       pancreas (PD - 1.5 mm) and tail of the pancreas (PD - 1.0 mm).      Endosonographic imaging of the ampulla showed no intramural        (subepithelial) lesion.      A small amount of hyperechoic material consistent with sludge was       visualized endosonographically in the gallbladder.      There was no sign of significant endosonographic abnormality in the       common bile duct (4.0 mm) and in the common hepatic duct (8.0 mm), just       mild prominence proximally.      Endosonographic imaging in the visualized portion of the liver showed no       mass.      No malignant-appearing lymph nodes were visualized in the celiac region       (level 20), peripancreatic region and porta hepatis region.      The celiac region was visualized. Patient has variance in anatomy where       the splenic artery makes much more acute angulation sooner than would be       expected, making access of the Celiac trunk not safe for an endoscopic       celiac block to be pursued. Impression:               EGD Impression:                           - Tortuous esophagus. Z-line irregular, 45 cm from                            the incisors.                           - 1 cm hiatal hernia.                           - Portal hypertensive gastropathy proximally.                           - Three non-bleeding angioectasias in the stomach.                            Treated with argon plasma coagulation (APC).                           - No gross lesions in the entire stomach.                           - Single duodenal angioectasia noted in D2. Treated                            with APC.                           - Congested duodenal mucosa. Biopsied.                           -  Mucosal changes in the duodenum.                           EUS Impression:                           - A cystic lesion was seen in the pancreatic tail.                            Cytology results are pending. However, the                            endosonographic appearance is suspicious for a                            pancreatic pseudocyst. Fine needle aspiration for                             fluid performed with elevated CA19-9 level noted in                            bloodwork (has been elevated for years).                           - Pancreatic parenchymal abnormalities consisting                            of hyperechoic foci and lobularity with                            honeycombing were noted in the entire pancreas.                           - The pancreatic duct had a tortuous/ectatic                            appearance in the pancreatic head, genu of the                            pancreas, body of the pancreas and tail of the                            pancreas.                           - Patient has chronic pancreatitis.                           - Hyperechoic material consistent with sludge was                            visualized endosonographically in the gallbladder.                           - There was no sign of significant pathology in the  common bile duct and in the common hepatic duct                            (other than prominence proximally).                           - No malignant-appearing lymph nodes were                            visualized in the celiac region (level 20),                            peripancreatic region and porta hepatis region.                           - Due to variance in anatomy from the Celiac trunk,                            safe endoscopic Celiac block cannot be pursued.                           - Caveat is as previously noted with the                            extensiveness of his chronic pancreatitis, subtle                            lesions can be missed in the endoscopic evaluation                            so sensitivity of today's procedure is decreased;                            but other changes such as ductal dilation or overt                            mass-lesion development is not felt to be noted at                            this time. Moderate  Sedation:      Not Applicable - Patient had care per Anesthesia. Recommendation:           - The patient will be observed post-procedure,                            until all discharge criteria are met.                           - Return patient to hospital ward for ongoing care.                           - Advance diet as tolerated.                           -  Ciprofloxacin 500 mg BID x 3-days to decrease                            risk of post-interventional infectious risk.                           - Complete alcohol cessation recommended.                           - Observe patient's clinical course.                           - Await cytology results.                           - Repeat imaging reasonable within 69-months.                           - Caveat is as previously noted with the                            extensiveness of his chronic pancreatitis, subtle                            lesions can be missed in the endoscopic evaluation                            so sensitivity of today's procedure is decreased;                            but other changes such as ductal dilation or overt                            mass-lesion development is not felt to be noted at                            this time.                           - The findings and recommendations were discussed                            with the patient.                           - The findings and recommendations were discussed                            with the referring physician. Procedure Code(s):        --- Professional ---                           [phone], Esophagogastroduodenoscopy, flexible,                            transoral; with transendoscopic ultrasound-guided  intramural or transmural fine needle                            aspiration/biopsy(s), (includes endoscopic                            ultrasound examination limited to the esophagus,                             stomach or duodenum, and adjacent structures)                           43255, 59, Esophagogastroduodenoscopy, flexible,                            transoral; with control of bleeding, any method                           43239, 59, Esophagogastroduodenoscopy, flexible,                            transoral; with biopsy, single or multiple Diagnosis Code(s):        --- Professional ---                           Q39.9, Congenital malformation of esophagus,                            unspecified                           K22.89, Other specified disease of esophagus                           K44.9, Diaphragmatic hernia without obstruction or                            gangrene                           K76.6, Portal hypertension                           K31.89, Other diseases of stomach and duodenum                           K31.819, Angiodysplasia of stomach and duodenum                            without bleeding                           K86.2, Cyst of pancreas                           K86.9, Disease of pancreas, unspecified  I89.9, Noninfective disorder of lymphatic vessels                            and lymph nodes, unspecified                           R97.8, Other abnormal tumor markers                           R10.84, Generalized abdominal pain                           K86.1, Other chronic pancreatitis                           R93.3, Abnormal findings on diagnostic imaging of                            other parts of digestive tract                           K83.8, Other specified diseases of biliary tract CPT copyright 2022 [employer]. All rights reserved. The codes documented in this report are preliminary and upon coder review may  be revised to meet current compliance requirements. Corliss Parish, MD 03/06/2023 2:12:44 PM Number of Addenda: 0
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
## 2023-03-06 NOTE — Transfer of Care (Signed)
Immediate Anesthesia Transfer of Care Note  Patient: Remer Tien Khoshaba  Procedure(s) Performed: UPPER ENDOSCOPIC ULTRASOUND (EUS) RADIAL ESOPHAGOGASTRODUODENOSCOPY (EGD) BIOPSY HOT HEMOSTASIS (ARGON PLASMA COAGULATION/BICAP) FINE NEEDLE ASPIRATION (FNA) LINEAR  Patient Location: PACU  Anesthesia Type:MAC  Level of Consciousness: drowsy  Airway & Oxygen Therapy: Patient Spontanous Breathing  Post-op Assessment: Report given to RN and Post -op Vital signs reviewed and stable  Post vital signs: Reviewed  Last Vitals:  Vitals Value Taken Time  BP    Temp    Pulse    Resp    SpO2      Last Pain:  Vitals:   03/06/23 1227  TempSrc: Temporal  PainSc: 6       Patients Stated Pain Goal: 3 (03/06/23 1045)  Complications: No notable events documented.

## 2023-03-06 NOTE — Progress Notes (Signed)
Chaplain engaged in an initial visit with [name]. Bohdan shared about a significant life experience that he is enduring right now outside of his health. Chaplain offered space for [name] to candidly share and engage in narrative life review.   Chaplain believes it may be beneficial for Mattias to speak with Social Work as he is enduring some socio-economic issues. Chaplain gave nurse referral. Daris also shared his faith around this time in his life.   Chaplain provided presence, support, and listening. Chaplain will follow-up tomorrow.     03/06/23 1600  Spiritual Encounters  Type of Visit Initial  Care provided to: Patient  Reason for visit Routine spiritual support  Spiritual Framework  Presenting Themes Significant life change;Community and relationships;Impactful experiences and emotions  Interventions  Spiritual Care Interventions Made Established relationship of care and support;Compassionate presence;Reflective listening;Narrative/life review;Encouragement  Intervention Outcomes  Outcomes Connection to spiritual care;Awareness of support

## 2023-03-06 NOTE — Progress Notes (Signed)
PROGRESS NOTE    [name]  BMW:[id number] DOB: [birth year] DOA: 02/27/2023 PCP: Default, Provider, MD    Chief Complaint  Patient presents with   Abdominal Pain    Brief Narrative:  Patient 52 year old gentleman history of chronic alcoholism, polysubstance abuse, anemia, depression/anxiety, bipolar disorder, history of suicide attempt, PTSD, OA, chronic bronchitis, chronic lower back pain, chronic pancreatitis, cocaine abuse, GERD, hiatal hernia, hypertension, WPW, sickle cell trait presented to the ED with complaints of right upper quadrant abdominal pain similar to his pancreatitis pain, some burning with dysuria and suprapubic discomfort down to the scrotum area with some urinary incontinence.  Urinalysis done unremarkable.  UDS positive for cocaine and amphetamines.  CRP sed rate normal.  CT abdomen and pelvis done with changes of chronic pancreatitis with pseudocyst formation.  No acute abnormality identified.  MRI of the L-spine with stable lumbar spine degeneration.  Thoracic spine cord T2 signal heterogenicity at the levels of T5-T10.  No thoracic spinal stenosis or neural impingement.  Patient noted to be in AKI, noted to have some urinary retention, Foley catheter placed, electrolytes optimized and pain managed.  Patient noted to be scheduled for EUS with celiac plexus block on 03/02/2023, per GI patient to undergo this procedure in house tomorrow 03/02/2023 if medically stable as patient with difficulty with outpatient follow-up with appointments.   Assessment & Plan:   Principal Problem:   AKI (acute kidney injury) (HCC) Active Problems:   Paroxysmal atrial fibrillation (HCC)   Essential hypertension   GERD (gastroesophageal reflux disease)   Hypomagnesemia   WPW (Wolff-Parkinson-White syndrome)   Acute urinary retention   Hyperkalemia   #1 acute kidney injury/urinary retention -Likely multifactorial secondary to a prerenal azotemia in the setting of  dehydration and likelyalsopostrenal as patient with urinary incontinence and concern for urinary retention. -Foley catheter placed on admission with good urine output while Foley catheter was in place.   -Patient underwent voiding trial yesterday Foley catheter removed with urine output of 4.866 L over the past 24 hours with 2.6 L out of Foley catheter prior to discontinuation.  -Renal function improving creatinine down to 0.55 from 1.80 on presentation. -Continue Flomax 0.4 mg daily for urinary retention.   -Outpatient follow-up.    2.  Paroxysmal A-fib/WPW -Stable. -Optimize electrolytes. -Toprol-XL.    3.  Hypertension -Continue Toprol-XL.   4.  GERD -Continue PPI, Pepcid.   -Status post GI cocktail x 1.   5.  Chronic pancreatitis -Patient with complaints of abdominal pain similar to his chronic pancreatitis. -CT abdomen and pelvis done with changes of chronic pancreatitis with pseudocyst formation.  No acute abnormality identified. -Patient currently tolerating clear liquids and diet advanced to a regular diet. -Creon resumed. -Continue IV Dilaudid for severe pain, oxycodone for moderate and breakthrough pain. -Patient was to have a EUS and celiac plexus block in the outpatient setting, on 03/02/2023. -Contacted by GI who would like to have procedure done during this hospitalization and procedure was to be done 03/02/2023 however due to hyperkalemia procedure canceled and rescheduled for today, Monday, 03/06/2023.  -Patient status post Lokelma with resolution of hyperkalemia with potassium at 3.9.   -Continue current diet, Creon, pain management.   -Will need to follow-up with GI in the outpatient setting.   6.  Hyperkalemia -Potassium currently at 3.9 this morning status post Lokelma 10 mg 3 times daily.   -Lokelma discontinued. -Repeat labs in the AM.    7.  Hypomagnesemia -Magnesium at 1.6 this morning.   -  Magnesium sulfate 6 g IV x 1. -Continue magnesium oxide 800 mg  p.o. twice daily.  -Repeat labs in the AM.   DVT prophylaxis: SCDs.  Code Status: Full Family Communication: Updated patient.  No family at bedside. Disposition: Likely home when clinically improved and post GI procedure hopefully in the next 24 hours....  Status is: Inpatient The patient will require care spanning > 2 midnights and should be moved to inpatient because: Severity of illness   Consultants:  None  Procedures:  CT abdomen and pelvis 02/28/2023 MRI of the T and L-spine 02/28/2023  Antimicrobials:  Anti-infectives (From admission, onward)    None         Subjective: Patient standing up in doorway.  Denies any chest pain or shortness of breath.  Some complaints of epigastric upper abdominal pain.  No nausea or vomiting.  States has been having good urine output after Foley catheter was removed yesterday.  Denies any bleeding.  Awaiting procedure to be done this afternoon.    Objective: Vitals:   03/05/23 1443 03/05/23 2022 03/05/23 2037 03/06/23 0506  BP: (!) 148/94 (!) 181/107 (!) 149/100 124/87  Pulse: 92 96 85 78  Resp: 19 19  19   Temp: 98.2 F (36.8 C) 98.4 F (36.9 C)  97.6 F (36.4 C)  TempSrc: Oral Oral  Oral  SpO2: 100% 100%  99%  Weight:      Height:        Intake/Output Summary (Last 24 hours) at 03/06/2023 1024 Last data filed at 03/06/2023 0533 Gross per 24 hour  Intake 757.81 ml  Output 4866 ml  Net -4108.19 ml   Filed Weights   03/01/23 0245  Weight: 55.9 kg    Examination:  General exam: NAD Respiratory system: Lungs clear auscultation bilaterally.  No wheezes, no crackles, no rhonchi.  Fair air movement.  Speaking in full sentences.  Cardiovascular system: Regular rate rhythm no murmurs rubs or gallops.  No JVD.  No lower extremity edema.  Gastrointestinal system: Abdomen is soft, nontender, nondistended, some tenderness to palpation epigastric region.  Positive bowel sounds.  No rebound.  No guarding.  Central nervous system:  Alert and oriented. No focal neurological deficits. Extremities: Symmetric 5 x 5 power. Skin: No rashes, lesions or ulcers Psychiatry: Judgement and insight appear normal. Mood & affect appropriate.     Data Reviewed: I have personally reviewed following labs and imaging studies  CBC: Recent Labs  Lab 02/27/23 2106 02/27/23 2120 03/02/23 0430 03/03/23 0416 03/04/23 0442 03/05/23 0807 03/06/23 0429  WBC 6.5   < > 7.1 6.0 6.3 8.4 7.9  NEUTROABS 3.3  --   --  2.7  --   --   --   HGB 9.6*   < > 8.7* 8.5* 8.1* 8.8* 7.8*  HCT 31.0*   < > 30.0* 29.2* 26.2* 29.6* 25.8*  MCV 84.7   < > 89.6 90.4 86.2 88.1 84.6  PLT 164   < > 135* 137* 133* 154 132*   < > = values in this interval not displayed.    Basic Metabolic Panel: Recent Labs  Lab 03/02/23 0430 03/02/23 0812 03/02/23 1312 03/03/23 0416 03/04/23 0442 03/05/23 0807 03/06/23 0429  NA 138  --   --  139 134* 134* 136  K 5.6*   < > 5.4* 4.6 4.2 4.3 3.9  CL 112*  --   --  111 103 101 103  CO2 19*  --   --  20* 23 24 24  GLUCOSE 77  --   --  96 97 194* 157*  BUN 7  --   --  <5* 9 11 13   CREATININE 0.77  --   --  0.79 0.86 0.87 0.55*  CALCIUM 9.2  --   --  9.0 8.8* 8.9 8.9  MG 1.1*  --   --  1.2* 1.8 1.3* 1.6*  PHOS  --   --   --   --  4.6  --   --    < > = values in this interval not displayed.    GFR: Estimated Creatinine Clearance: 84.4 mL/min (A) (by C-G formula based on SCr of 0.55 mg/dL (L)).  Liver Function Tests: Recent Labs  Lab 02/27/23 2106 03/02/23 0430 03/04/23 0442  AST 35 27  --   ALT 23 17  --   ALKPHOS 92 67  --   BILITOT 0.7 0.7  --   PROT 8.4* 6.9  --   ALBUMIN 4.5 3.5 3.4*    CBG: No results for input(s): "GLUCAP" in the last 168 hours.   Recent Results (from the past 240 hour(s))  Resp panel by RT-PCR (RSV, Flu A&B, Covid) Anterior Nasal Swab     Status: None   Collection Time: 02/28/23 12:34 AM   Specimen: Anterior Nasal Swab  Result Value Ref Range Status   SARS Coronavirus 2 by  RT PCR NEGATIVE NEGATIVE Final    Comment: (NOTE) SARS-CoV-2 target nucleic acids are NOT DETECTED.  The SARS-CoV-2 RNA is generally detectable in upper respiratory specimens during the acute phase of infection. The lowest concentration of SARS-CoV-2 viral copies this assay can detect is 138 copies/mL. A negative result does not preclude SARS-Cov-2 infection and should not be used as the sole basis for treatment or other patient management decisions. A negative result may occur with  improper specimen collection/handling, submission of specimen other than nasopharyngeal swab, presence of viral mutation(s) within the areas targeted by this assay, and inadequate number of viral copies(<138 copies/mL). A negative result must be combined with clinical observations, patient history, and epidemiological information. The expected result is Negative.  Fact Sheet for Patients:  BloggerCourse.com  Fact Sheet for Healthcare Providers:  SeriousBroker.it  This test is no t yet approved or cleared by the [place] FDA and  has been authorized for detection and/or diagnosis of SARS-CoV-2 by FDA under an Emergency Use Authorization (EUA). This EUA will remain  in effect (meaning this test can be used) for the duration of the COVID-19 declaration under Section 564(b)(1) of the Act, 21 U.S.C.section 360bbb-3(b)(1), unless the authorization is terminated  or revoked sooner.       Influenza A by PCR NEGATIVE NEGATIVE Final   Influenza B by PCR NEGATIVE NEGATIVE Final    Comment: (NOTE) The Xpert Xpress SARS-CoV-2/FLU/RSV plus assay is intended as an aid in the diagnosis of influenza from Nasopharyngeal swab specimens and should not be used as a sole basis for treatment. Nasal washings and aspirates are unacceptable for Xpert Xpress SARS-CoV-2/FLU/RSV testing.  Fact Sheet for Patients: BloggerCourse.com  Fact Sheet  for Healthcare Providers: SeriousBroker.it  This test is not yet approved or cleared by the [place] FDA and has been authorized for detection and/or diagnosis of SARS-CoV-2 by FDA under an Emergency Use Authorization (EUA). This EUA will remain in effect (meaning this test can be used) for the duration of the COVID-19 declaration under Section 564(b)(1) of the Act, 21 U.S.C. section 360bbb-3(b)(1), unless the authorization is terminated or  revoked.     Resp Syncytial Virus by PCR NEGATIVE NEGATIVE Final    Comment: (NOTE) Fact Sheet for Patients: BloggerCourse.com  Fact Sheet for Healthcare Providers: SeriousBroker.it  This test is not yet approved or cleared by the [place] FDA and has been authorized for detection and/or diagnosis of SARS-CoV-2 by FDA under an Emergency Use Authorization (EUA). This EUA will remain in effect (meaning this test can be used) for the duration of the COVID-19 declaration under Section 564(b)(1) of the Act, 21 U.S.C. section 360bbb-3(b)(1), unless the authorization is terminated or revoked.  Performed at [facility], [address], [place], [place] [address]          Radiology Studies: No results found.      Scheduled Meds:  Chlorhexidine Gluconate Cloth  6 each Topical Daily   famotidine  20 mg Oral BID   fluticasone  2 spray Each Nare Daily   lipase/protease/amylase  72,000 Units Oral TID WC   liver oil-zinc oxide   Topical BID   magnesium oxide  800 mg Oral BID   metoprolol  200 mg Oral Daily   pantoprazole  40 mg Oral BID AC   pregabalin  100 mg Oral BID   sucralfate  1 g Oral TID WC   tamsulosin  0.4 mg Oral Daily   Continuous Infusions:  magnesium sulfate bolus IVPB     Followed by   magnesium sulfate bolus IVPB        LOS: 5 days    Time spent: 35 minutes    Ramiro Harvest, MD [facility]   To  contact the attending provider between 7A-7P or the covering provider during after hours 7P-7A, please log into the web site [email or web address] and access using universal [place] password for that web site. If you do not have the password, please call the hospital operator.  03/06/2023, 10:24 AM

## 2023-03-07 ENCOUNTER — Ambulatory Visit: Payer: MEDICAID | Admitting: Gastroenterology

## 2023-03-07 ENCOUNTER — Encounter: Payer: Self-pay | Admitting: Gastroenterology

## 2023-03-07 ENCOUNTER — Other Ambulatory Visit (HOSPITAL_COMMUNITY): Payer: Self-pay

## 2023-03-07 ENCOUNTER — Encounter (HOSPITAL_COMMUNITY): Payer: Self-pay | Admitting: Gastroenterology

## 2023-03-07 DIAGNOSIS — E875 Hyperkalemia: Secondary | ICD-10-CM | POA: Diagnosis not present

## 2023-03-07 DIAGNOSIS — K2980 Duodenitis without bleeding: Secondary | ICD-10-CM

## 2023-03-07 DIAGNOSIS — Z8711 Personal history of peptic ulcer disease: Secondary | ICD-10-CM

## 2023-03-07 DIAGNOSIS — M5417 Radiculopathy, lumbosacral region: Secondary | ICD-10-CM

## 2023-03-07 DIAGNOSIS — K862 Cyst of pancreas: Secondary | ICD-10-CM

## 2023-03-07 DIAGNOSIS — K31819 Angiodysplasia of stomach and duodenum without bleeding: Secondary | ICD-10-CM

## 2023-03-07 DIAGNOSIS — K860 Alcohol-induced chronic pancreatitis: Secondary | ICD-10-CM

## 2023-03-07 DIAGNOSIS — K863 Pseudocyst of pancreas: Secondary | ICD-10-CM

## 2023-03-07 DIAGNOSIS — I480 Paroxysmal atrial fibrillation: Secondary | ICD-10-CM | POA: Diagnosis not present

## 2023-03-07 DIAGNOSIS — N179 Acute kidney failure, unspecified: Secondary | ICD-10-CM | POA: Diagnosis not present

## 2023-03-07 LAB — BASIC METABOLIC PANEL
Anion gap: 8 (ref 5–15)
BUN: 10 mg/dL (ref 6–20)
CO2: 23 mmol/L (ref 22–32)
Calcium: 8.7 mg/dL — ABNORMAL LOW (ref 8.9–10.3)
Chloride: 101 mmol/L (ref 98–111)
Creatinine, Ser: 0.92 mg/dL (ref 0.61–1.24)
GFR, Estimated: >60 mL/min (ref >60)
Glucose, Bld: 142 mg/dL — ABNORMAL HIGH (ref 70–99)
Potassium: 3.7 mmol/L (ref 3.5–5.1)
Sodium: 132 mmol/L — ABNORMAL LOW (ref 135–145)

## 2023-03-07 LAB — CBC
HCT: 27.3 % — ABNORMAL LOW (ref 39.0–52.0)
Hemoglobin: 8.2 g/dL — ABNORMAL LOW (ref 13.0–17.0)
MCH: 25.8 pg — ABNORMAL LOW (ref 26.0–34.0)
MCHC: 30 g/dL (ref 30.0–36.0)
MCV: 85.8 fL (ref 80.0–100.0)
Platelets: 130 10*3/uL — ABNORMAL LOW (ref 150–400)
RBC: 3.18 MIL/uL — ABNORMAL LOW (ref 4.22–5.81)
RDW: 19.7 % — ABNORMAL HIGH (ref 11.5–15.5)
WBC: 11.8 10*3/uL — ABNORMAL HIGH (ref 4.0–10.5)
nRBC: 0 % (ref 0.0–0.2)

## 2023-03-07 LAB — SURGICAL PATHOLOGY

## 2023-03-07 LAB — MAGNESIUM: Magnesium: 1.7 mg/dL (ref 1.7–2.4)

## 2023-03-07 MED ORDER — OXYCODONE HCL 5 MG PO TABS
5.0000 mg | ORAL_TABLET | ORAL | 0 refills | Status: DC | PRN
  Filled 2023-03-07: qty 15, 3d supply, fill #0

## 2023-03-07 MED ORDER — LOPERAMIDE HCL 2 MG PO CAPS
2.0000 mg | ORAL_CAPSULE | ORAL | 0 refills | Status: DC | PRN
  Filled 2023-03-07: qty 20, 5d supply, fill #0

## 2023-03-07 MED ORDER — TAMSULOSIN HCL 0.4 MG PO CAPS
0.4000 mg | ORAL_CAPSULE | Freq: Every day | ORAL | 1 refills | Status: DC
  Filled 2023-03-07: qty 30, 30d supply, fill #0

## 2023-03-07 MED ORDER — HYDROCORTISONE 1 % EX CREA
TOPICAL_CREAM | CUTANEOUS | 0 refills | Status: DC | PRN
  Filled 2023-03-07: qty 28, 30d supply, fill #0

## 2023-03-07 MED ORDER — ZINC OXIDE 40 % EX OINT
TOPICAL_OINTMENT | Freq: Two times a day (BID) | CUTANEOUS | 0 refills | Status: DC
  Filled 2023-03-07: qty 56, fill #0

## 2023-03-07 MED ORDER — MAGNESIUM SULFATE 4 GM/100ML IV SOLN
4.0000 g | Freq: Once | INTRAVENOUS | Status: AC
Start: 2023-03-07 — End: 2023-03-07
  Administered 2023-03-07: 4 g via INTRAVENOUS
  Filled 2023-03-07: qty 100

## 2023-03-07 MED ORDER — CIPROFLOXACIN HCL 500 MG PO TABS
500.0000 mg | ORAL_TABLET | Freq: Two times a day (BID) | ORAL | 0 refills | Status: AC
  Filled 2023-03-07: qty 6, 3d supply, fill #0

## 2023-03-07 MED ORDER — PANTOPRAZOLE SODIUM 40 MG PO TBEC
40.0000 mg | DELAYED_RELEASE_TABLET | Freq: Two times a day (BID) | ORAL | 0 refills | Status: DC
  Filled 2023-03-07: qty 60, 30d supply, fill #0

## 2023-03-07 MED ORDER — MAGNESIUM OXIDE -MG SUPPLEMENT 400 (240 MG) MG PO TABS
800.0000 mg | ORAL_TABLET | Freq: Two times a day (BID) | ORAL | 0 refills | Status: AC
  Filled 2023-03-07: qty 28, 7d supply, fill #0

## 2023-03-07 MED ORDER — FAMOTIDINE 20 MG PO TABS
20.0000 mg | ORAL_TABLET | Freq: Two times a day (BID) | ORAL | 0 refills | Status: DC
  Filled 2023-03-07: qty 60, 30d supply, fill #0

## 2023-03-07 MED ORDER — METOPROLOL SUCCINATE ER 200 MG PO TB24
200.0000 mg | ORAL_TABLET | Freq: Every day | ORAL | 0 refills | Status: DC
  Filled 2023-03-07: qty 90, 90d supply, fill #0

## 2023-03-07 NOTE — Discharge Summary (Signed)
Physician Discharge Summary  [name] ZOX:[id number] DOB: 1970-05-14 DOA: 02/27/2023  PCP: Default, Provider, MD  Admit date: 02/27/2023 Discharge date: 03/07/2023  Time spent: 60 minutes  Recommendations for Outpatient Follow-up:  Follow-up with PCP in 1 week.  On follow-up patient will need a basic metabolic profile, magnesium level done to follow-up on electrolytes and renal function. Follow-up with GI, Dr. Adela Lank in 3 weeks. Follow-up with [facility] in 1 week for follow-up for urinary retention and voiding trial.   Discharge Diagnoses:  Principal Problem:   AKI (acute kidney injury) (HCC) Active Problems:   Paroxysmal atrial fibrillation (HCC)   Essential hypertension   GERD (gastroesophageal reflux disease)   Hypomagnesemia   WPW (Wolff-Parkinson-White syndrome)   Acute urinary retention   Hyperkalemia   Duodenitis   Gastric AVM   AVM (arteriovenous malformation) of small bowel, acquired   History of peptic ulcer   Lumbosacral radiculopathy   Discharge Condition: Stable and improved.  Diet recommendation: Heart healthy  Filed Weights   03/01/23 0245  Weight: 55.9 kg    History of present illness:  HPI per Dr. Fleeta Emmer Joevany Moran is a 52 y.o. male with medical history significant of alcoholism, anemia, anxiety, depression, bipolar disorder, history of suicidal attempt, PTSD, osteoarthritis, chronic bronchitis, chronic lower back pain, chronic pancreatitis, cocaine abuse, femoral condyle fracture, GERD, hiatal hernia, hyperlipidemia, hypertension, cannabis use, migraine headaches, history of pneumonia, seizure disorder, sickle cell trait, Wolff-Parkinson-White syndrome who presented to the emergency department complaints of RUQ abdominal pain for the past few days.  Pain feels similar to his pancreatitis.  He is also having burning dysuria with suprapubic discomfort radiating down to his scrotum.  He feels he has been incontinent since  last Wednesday.  Left-sided chest discomfort   98.2 F, pulse 77, respirations 16, BP 149/103 mmHg O2 sat 100% on room air.  Patient received famotidine 20 mg IVP, fentanyl 50 mcg IVP x 2, lorazepam 1 mg IVP, ondansetron 4 mg IVP x 2, Protonix 40 mg IVP and 1000 mL of LR bolus.   Lab work: Urinalysis was unremarkable.  UDS was positive for cocaine and amphetamines.  Coronavirus influenza and RSV PCR negative.  CRP and sed rate were normal.  Troponin x 2 unremarkable.  Normal lipase level.  CMP showed a CO2 of 20 mmol/L with a normal anion gap, creatinine of 1.64 mg/dL and total protein 8.4 g/dL.   Imaging: CT abdomen/pelvis without contrast with chronic pancreatitis changes and pseudocyst formation.  60 lesions are stable from prior exam with the largest one measuring 3.9 cm in the pancreatic body.  There was no acute abnormality.  Aortic atherosclerosis.  You have a scrotum ultrasound with Doppler with no evidence of testicular torsion.  Heterogeneous echotexture of the testicle bilateral with microlithiasis.  Monthly testicular self-examination and annual physical exams recommended.  If any other risk factors for testicular carcinoma urology referral should be considered.  Left epididymal head cyst.  MRI lumbar spine without contrast showing stable lumbar spine degeneration when compared to January MRI with no lumbar spinal stenosis.  Normal pancreas.  Thoracic spine with renal versus artifactual thoracic spinal cord T2 signal heterogeneity at the levels of T5-T10.  No associated correspond [name], edema or enhancement.  Subsequently, difficult exclude chronic thoracic demyelination or transfer myelitis.  Ashes thoracic spinal canal with minimal for age thoracic spine degeneration.  No thoracic spinal stenosis or neural impingement.  Partially visible chronically advanced cervical spine degeneration similar to 2021 CT.  Suspected  degenerative spinal stenosis at C3-C4.  Please see images and full radiology  report for further details.  Hospital Course:  #1 acute kidney injury/urinary retention -Likely multifactorial secondary to a prerenal azotemia in the setting of dehydration and likelyalsopostrenal as patient with urinary incontinence and concern for urinary retention. -Foley catheter placed on admission with good urine output while Foley catheter was in place.   -Patient underwent voiding trial which he failed on Foley catheter to be placed back in.   -Renal function improved and AKI had resolved by day of discharge.   -Patient started on Flomax 0.4 mg daily which will be discharged on.   -Patient was discharged with Foley catheter in place with outpatient follow-up with urology.    2.  Paroxysmal A-fib/WPW -Stable. -Patient maintained on Toprol-XL.   -Patient maintained on Toprol XL.     3.  Hypertension -Patient maintained on Toprol-XL during the hospitalization.    4.  GERD -Patient maintained on PPI, Pepcid during the hospitalization.   -Patient status post GI cocktail x 1.    5.  Chronic pancreatitis -Patient with complaints of abdominal pain similar to his chronic pancreatitis. -CT abdomen and pelvis done with changes of chronic pancreatitis with pseudocyst formation.  No acute abnormality identified. -Patient was placed on clear liquids and diet subsequently advanced to regular diet which he tolerated.   -Patient maintained on home regimen Creon.   -Patient also placed on IV Dilaudid for severe pain as well as oxycodone for moderate and breakthrough pain.   -Patient was to have a EUS and celiac plexus block in the outpatient setting, on 03/02/2023. -Contacted by GI who would like to have procedure done during this hospitalization and procedure was to be done 03/02/2023 however due to hyperkalemia procedure canceled and rescheduled and done on 03/06/2023.  -Patient underwent an upper EUS per GI on 03/06/2023 which showed a torturous esophagus, Z-line irregular, 45 cm from  incisors, 1 cm hiatal hernia, portal hypertensive gastropathy approximately.  3 nonbleeding angioectasias in the stomach status post APC.  No gross lesions in the entire stomach.  Single duodenal angiectasia noted in D2 treated with APC.  Congested duodenal mucosa biopsied.  Mucosal changes in the duodenum.  Cystic lesion seen in the pancreatic tail, cytology results pending.  Endosonographic appearance suspicious for pancreatic pseudocyst.  Fine-needle aspiration for fluid performed with elevated CA 19-9 level noted in blood work noted to be elevated for years.  Pancreatic parenchymal abnormalities consisting of hyperechoic foci and lobularity with honeycombing also noted entire pancreas.  Pancreatic duct had a tortuous ectatic appearance in the pancreatic head, genu of the pancreas, body of pancreas and tail of pancreas.  Patient has chronic pancreatitis.  Hyperechoic material consistent with sludge was visualized endosonographically in the gallbladder.  No sign of significant pathology in the CBD and common hepatic duct.  No malignant appearing lymph nodes were visualized in the celiac region peripancreatic region and porta hepatitis region.  Due to variance in anatomy from celiac trunk safe endoscopic celiac block could not be pursued. -Post upper EUS GI recommended 3 days of ciprofloxacin to decrease risk of post interventional infection.  Complete alcohol cessation recommended to patient. -Patient placed back on a diet which she tolerated, pain was managed and patient will be discharged in stable and improved condition with outpatient follow-up with PCP and gastroenterology.   6.  Hyperkalemia -Patient received Lokelma during the hospitalization with resolution of hyperkalemia.     7.  Hypomagnesemia -Repleted during the hospitalization  with IV magnesium.   -Patient subsequently started on oral magnesium oxide 800 mg twice daily and be discharged on 1 week of magnesium oxide.   -Outpatient follow-up.        Procedures: CT abdomen and pelvis 02/28/2023 MRI of the T and L-spine 02/28/2023 Upper EUS 03/06/2023 per GI: Dr. Meridee Score  Consultations: None  Discharge Exam: Vitals:   03/07/23 0617 03/07/23 1208  BP:  119/89  Pulse:  73  Resp: 12 16  Temp:  98.7 F (37.1 C)  SpO2:      General: NAD Cardiovascular: RRR no murmurs rubs or gallops.  No JVD.  No lower extremity edema. Respiratory: Clear to auscultation bilaterally.  No wheezes, no crackles, no rhonchi.  Fair air movement.  Speaking in full sentences.  Discharge Instructions   Discharge Instructions     Diet - low sodium heart healthy   Complete by: As directed    Increase activity slowly   Complete by: As directed       Allergies as of 03/07/2023       Reactions   Robaxin [methocarbamol] Other (See Comments)   jumpy limbs   Aspirin Other (See Comments)   Unknown reaction   Shellfish-derived Products Nausea And Vomiting, Rash   Trazodone And Nefazodone Other (See Comments)   Muscle spasms   Adhesive [tape] Itching   Fish-derived Products    Latex Itching   Toradol [ketorolac Tromethamine] Other (See Comments)   Has ulcers; cannot have this   Contrast Media [iodinated Contrast Media] Hives   Reglan [metoclopramide] Other (See Comments)   Muscle spasms   Salmon [fish Oil] Nausea And Vomiting, Rash        Medication List     TAKE these medications    ciprofloxacin 500 MG tablet Commonly known as: CIPRO Take 1 tablet (500 mg total) by mouth 2 (two) times daily for 3 days.   famotidine 20 MG tablet Commonly known as: PEPCID Take 1 tablet (20 mg total) by mouth 2 (two) times daily.   fluticasone 50 MCG/ACT nasal spray Commonly known as: FLONASE Place 2 sprays into both nostrils 2 (two) times daily as needed for allergies.   folic acid 1 MG tablet Commonly known as: FOLVITE Take 1 tablet (1 mg total) by mouth daily.   hydrocortisone cream 1 % Apply topically as needed for itching.    lipase/protease/amylase [address] UNITS Cpep capsule Commonly known as: Creon Take 2 capsules (72,000 Units total) by mouth 3 (three) times daily with meals. May also take 1 capsule (36,000 Units total) as needed (with snacks).   liver oil-zinc oxide 40 % ointment Commonly known as: DESITIN Apply topically 2 (two) times daily. Apply to buttocks   loperamide 2 MG capsule Commonly known as: IMODIUM Take 1 capsule (2 mg total) by mouth as needed for diarrhea or loose stools.   magnesium oxide 400 (240 Mg) MG tablet Commonly known as: MAG-OX Take 2 tablets (800 mg total) by mouth 2 (two) times daily for 7 days.   metoprolol 200 MG 24 hr tablet Commonly known as: TOPROL-XL Take 1 tablet (200 mg total) by mouth daily.   nitroGLYCERIN 0.4 MG SL tablet Commonly known as: NITROSTAT Place 0.4 mg under the tongue every 5 (five) minutes as needed for chest pain.   ondansetron 4 MG disintegrating tablet Commonly known as: ZOFRAN-ODT Take 1 tablet (4 mg total) by mouth 2 (two) times daily as needed for nausea or vomiting. What changed: when to take this  oxyCODONE 5 MG immediate release tablet Commonly known as: Oxy IR/ROXICODONE Take 1 tablet (5 mg total) by mouth every 4 (four) hours as needed for breakthrough pain or moderate pain (pain score 4-6).   pantoprazole 40 MG tablet Commonly known as: Protonix Take 1 tablet (40 mg total) by mouth 2 (two) times daily before a meal.   pregabalin 100 MG capsule Commonly known as: LYRICA Take 100 mg by mouth 2 (two) times daily.   Simethicone 40 MG/0.6ML Liqd Take 0.6 mLs (40 mg total) by mouth in the morning, at noon, and at bedtime for 7 days. What changed:  when to take this reasons to take this   sucralfate 1 GM/10ML suspension Commonly known as: Carafate Take 10 mLs (1 g total) by mouth See admin instructions. Take 4 times with meals and at bedtime. What changed: additional instructions   tamsulosin 0.4 MG Caps capsule Commonly  known as: FLOMAX Take 1 capsule (0.4 mg total) by mouth daily. Start taking on: March 08, 2023       Allergies  Allergen Reactions   Robaxin [Methocarbamol] Other (See Comments)    jumpy limbs   Aspirin Other (See Comments)    Unknown reaction   Shellfish-Derived Products Nausea And Vomiting and Rash   Trazodone And Nefazodone Other (See Comments)    Muscle spasms   Adhesive [Tape] Itching   Fish-Derived Products    Latex Itching   Toradol [Ketorolac Tromethamine] Other (See Comments)    Has ulcers; cannot have this   Contrast Media [Iodinated Contrast Media] Hives   Reglan [Metoclopramide] Other (See Comments)    Muscle spasms   Salmon [Fish Oil] Nausea And Vomiting and Rash    Follow-up Information     PCP. Schedule an appointment as soon as possible for a visit in 1 week(s).          [name], Willaim Rayas, MD. Schedule an appointment as soon as possible for a visit in 3 week(s).   Specialty: Gastroenterology Contact information: [address] [place] [address] [phone]         [facility]. Schedule an appointment as soon as possible for a visit in 1 week(s).   Contact information: [address] [place] [address] [phone]                 The results of significant diagnostics from this hospitalization (including imaging, microbiology, ancillary and laboratory) are listed below for reference.    Significant Diagnostic Studies: MR Lumbar Spine W Wo Contrast  Result Date: 02/28/2023 CLINICAL DATA:  52 year old male with flank and back pain, urinary retention, left lower extremity weakness. Acute myelopathy. EXAM: MRI LUMBAR SPINE WITHOUT AND WITH CONTRAST TECHNIQUE: Multiplanar and multiecho pulse sequences of the lumbar spine were obtained without and with intravenous contrast. CONTRAST:  5.76mL GADAVIST GADOBUTROL 1 MMOL/ML IV SOLN in conjunction with contrast enhanced imaging of the thoracic  spine reported separately. COMPARISON:  Lumbar MRI 05/21/2022. Thoracic MRI today reported separately. CT Abdomen and Pelvis. Yesterday FINDINGS: Segmentation: Normal, concordant with the thoracic numbering today and also the previous lumbar MRI in January. Alignment: Stable straightening of lumbar lordosis, with subtle degenerative appearing retrolisthesis of L3 on L4 through L5-S1. No significant scoliosis. Vertebrae: Normal background bone marrow signal. Mild acute on chronic L5-S1 endplate degeneration including far lateral mild endplate marrow edema which is greater on the right. See additional details of that level below. No other acute osseous abnormality identified.  Intact visible sacrum and SI joints. Conus medullaris and cauda equina: Conus extends to the T12-L1 level. No convincing lower spinal cord or conus signal abnormality. No abnormal intradural enhancement. Cauda equina nerve roots are within normal limits. No dural thickening. Paraspinal and other soft tissues: Stable to the CT Abdomen and Pelvis yesterday, including abnormal pancreas with partially visible cystic masses in the tail. Negative visualized posterior paraspinal soft tissues. Disc levels: T11-T12 and T12-L1:  Negative. L1-L2: Mild disc space loss and disc bulging without stenosis, stable. L2-L3: Mild circumferential disc bulge, facet and ligament flavum hypertrophy without stenosis is stable. L3-L4: Subtle retrolisthesis. Circumferential disc bulge with small posterior annular fissure (series 4, image 23). Mild facet and ligament flavum hypertrophy. No spinal or lateral recess stenosis. Mild L3 foraminal stenosis on the left is stable. L4-L5: Chronic disc bulging with broad-based posterior central disc protrusion and annular fissure (series 4, image 30). Ligament flavum and mild facet hypertrophy. Disc effaces the descending L5 nerve roots in the lateral recesses, without spinal stenosis. No foraminal stenosis. L5-S1: Moderate to severe  disc space loss and evidence of vacuum disc. Circumferential disc osteophyte complex with a left paracentral posterior component (series 4, image 35). Chronically effaced descending left S1 nerve roots in the lateral recesses although no convincing lateral recess stenosis. No spinal stenosis. Mild to moderate bilateral L5 foraminal stenosis appears stable and greater on the right. IMPRESSION: 1. Stable lumbar spine degeneration from a January MRI with no lumbar spinal stenosis. Chronic disc degeneration which could be a source of L5 and S1 radiculitis. And up to moderate chronic bilateral L5 neural foraminal stenosis. 2. Conus medullaris and cauda equina nerve roots are stable and within normal limits. See Thoracic MRI today reported separately. 3. Abnormal pancreas. See details on CT Abdomen and Pelvis yesterday. Electronically Signed   By: Odessa Fleming M.D.   On: 02/28/2023 05:21   MR THORACIC SPINE W WO CONTRAST  Result Date: 02/28/2023 CLINICAL DATA:  52 year old male with flank and back pain, urinary retention, left lower extremity weakness. Acute myelopathy. EXAM: MRI THORACIC WITHOUT AND WITH CONTRAST TECHNIQUE: Multiplanar and multiecho pulse sequences of the thoracic spine were obtained without and with intravenous contrast. CONTRAST:  5.10mL GADAVIST GADOBUTROL 1 MMOL/ML IV SOLN COMPARISON:  Chest radiographs 02/23/2023.  CTA chest 12/06/2021. Cervical spine CT 03/05/2020. FINDINGS: Limited cervical spine imaging: Age advanced cervical spine degeneration, appearing similar to the 2021 CT. Altered cervical vertebral marrow signal at C4 and C5 corresponds to conspicuous degenerative appearing sclerosis on the prior CT. Mild chronic spondylolisthesis. Evidence of degenerative cervical spinal stenosis at C3-C4. Thoracic spine segmentation:  Normal on the comparison CTA. Alignment: Normal thoracic kyphosis. No thoracic spondylolisthesis. Vertebrae: Normal background thoracic vertebral marrow signal. Maintained  vertebral body height. No marrow edema or evidence of acute osseous abnormality. Mild chronic inferior endplate degeneration at T1. Cord: Intermittent mild motion artifact on the axial thoracic images. And on both axial images (especially series 21) and sagittal T2/stir (series 19, image 10), it is difficult to exclude scattered abnormal increased signal within the thoracic spinal cord. The levels below T10 would be spared. However, there is no cord expansion, and no evidence of abnormal spinal cord or intradural enhancement. No dural thickening. Furthermore, thoracic spinal cord volume seems relatively normal. The conus medullaris appears to remain normal at T12-L1, see also lumbar MRI today reported separately. Paraspinal and other soft tissues: Negative. Disc levels: Capacious thoracic spinal canal little to no age advanced thoracic spine degeneration as follows  T1-T2: Disc desiccation, mild disc space loss. Mild disc bulging. No stenosis. T2-T3: Disc desiccation.  Otherwise negative. T7-T8: Minor disc desiccation.  Otherwise negative. IMPRESSION: 1. Renal versus Artifactual thoracic spinal cord T2 signal heterogeneity at the levels T5 through T10. No associated cord expansion, edema, or enhancement. Subsequently, difficult to exclude chronic thoracic demyelination or transverse myelitis. 2. Capacious thoracic spinal canal with minimal for age thoracic spine degeneration. No thoracic spinal stenosis or neural impingement. 3. Partially visible chronically advanced cervical spine degeneration, similar to a 2021 CT. Suspected degenerative spinal stenosis at C3-C4. Electronically Signed   By: Odessa Fleming M.D.   On: 02/28/2023 05:13   CT ABDOMEN PELVIS WO CONTRAST  Result Date: 02/28/2023 CLINICAL DATA:  Left-sided flank pain, history of pancreatitis, initial encounter EXAM: CT ABDOMEN AND PELVIS WITHOUT CONTRAST TECHNIQUE: Multidetector CT imaging of the abdomen and pelvis was performed following the standard  protocol without IV contrast. RADIATION DOSE REDUCTION: This exam was performed according to the departmental dose-optimization program which includes automated exposure control, adjustment of the mA and/or kV according to patient size and/or use of iterative reconstruction technique. COMPARISON:  01/25/2023 FINDINGS: Lower chest: Lung bases are free of acute infiltrate or sizable effusion. Hepatobiliary: No focal liver abnormality is seen. No gallstones, gallbladder wall thickening, or biliary dilatation. Pancreas: Scattered calcifications consistent with chronic pancreatitis are again seen. Cystic lesions are noted in the body and tail of the pancreas. These are also stable from the prior exam. The largest of these measures 3.9 cm in the pancreatic body. Spleen: Normal in size without focal abnormality. Adrenals/Urinary Tract: Adrenal glands are within normal limits.Kidneys demonstrate a normal appearance without renal calculi. No obstructive changes are seen. The bladder is well distended. Stomach/Bowel: Scattered fecal material is noted throughout the colon without obstructive change. The appendix is within normal limits. Small bowel is unremarkable. Stomach is within normal limits as well. Vascular/Lymphatic: Aortic atherosclerosis. No enlarged abdominal or pelvic lymph nodes. Reproductive: Prostate is unremarkable. Other: No abdominal wall hernia or abnormality. No abdominopelvic ascites. Musculoskeletal: No acute or significant osseous findings. IMPRESSION: Changes of chronic pancreatitis with pseudocyst formation. No acute abnormality is identified to correspond with the given clinical history. Electronically Signed   By: Alcide Clever M.D.   On: 02/28/2023 01:12   US SCROTUM W/DOPPLER  Result Date: 02/27/2023 CLINICAL DATA:  Bilateral testicular pain for 2 days. EXAM: SCROTAL ULTRASOUND DOPPLER ULTRASOUND OF THE TESTICLES TECHNIQUE: Complete ultrasound examination of the testicles, epididymis, and other  scrotal structures was performed. Color and spectral Doppler ultrasound were also utilized to evaluate blood flow to the testicles. COMPARISON:  01/25/2023. FINDINGS: Right testicle Measurements: 3.0 x 1.7 x 2.3 cm. Heterogeneous echotexture, microlithiasis visualized. Left testicle Measurements: 3.4 x 1.8 x 2.3 cm. Heterogeneous echotexture, microlithiasis visualized. Right epididymis:  Normal in size and appearance. Left epididymis: Normal in size. A cyst is noted in the epididymal head measuring 0.4 x 0.3 x 0.4 cm. Hydrocele:  None visualized. Varicocele:  None visualized. Pulsed Doppler interrogation of both testes demonstrates normal low resistance arterial and venous waveforms bilaterally. IMPRESSION: 1. No evidence of testicular torsion. 2. 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 Current literature suggests that testicular microlithiasis is not a significant independent risk factor for development of testicular carcinoma, and that follow up imaging is not warranted in the absence of other risk factors. Monthly testicular self-examination and annual physical exams are considered appropriate surveillance. If patient has other risk factors for testicular carcinoma, then referral to  Urology should be considered. (Reference: DeCastro, et al.: A 5-Year Follow up Study of Asymptomatic Men with Testicular Microlithiasis. J Urol 2008; 179:1420-1423.) 3. Left epididymal head cyst. Electronically Signed   By: Thornell Sartorius M.D.   On: 02/27/2023 22:18   DG Chest 2 View  Result Date: 02/23/2023 CLINICAL DATA:  Encounter for chest pain EXAM: CHEST - 2 VIEW COMPARISON:  Two days ago FINDINGS: Generalized airway thickening. No collapse or consolidation. No edema, effusion, or pneumothorax. Normal heart size and mediastinal contours. IMPRESSION: Generalized bronchitic airway thickening. No pneumonia or pulmonary collapse. Electronically Signed   By: Tiburcio Pea M.D.   On:  02/23/2023 04:54   DG Chest 2 View  Result Date: 02/21/2023 CLINICAL DATA:  Chest pain. Right upper quadrant and left upper quadrant abdominal pain. EXAM: CHEST - 2 VIEW COMPARISON:  02/19/2023 FINDINGS: Midline trachea. Normal heart size and mediastinal contours. No pleural effusion or pneumothorax. Mild hyperinflation and interstitial thickening, consistent with the clinical history of smoking. No superimposed consolidation. IMPRESSION: No acute cardiopulmonary disease. Peribronchial thickening which may relate to chronic bronchitis or smoking. Electronically Signed   By: Jeronimo Greaves M.D.   On: 02/21/2023 18:18   DG Chest 2 View  Result Date: 02/19/2023 CLINICAL DATA:  Chest pain. EXAM: CHEST - 2 VIEW COMPARISON:  Chest radiograph dated 02/05/2023. FINDINGS: No focal consolidation, pleural effusion, or pneumothorax. The cardiac silhouette is within normal limits. No acute osseous pathology. IMPRESSION: No active cardiopulmonary disease. Electronically Signed   By: Elgie Collard M.D.   On: 02/19/2023 20:01   MR ABDOMEN MRCP W WO CONTAST  Result Date: 02/09/2023 CLINICAL DATA:  Persistent pancreatitis. EXAM: MRI ABDOMEN WITHOUT AND WITH CONTRAST (INCLUDING MRCP) TECHNIQUE: Multiplanar multisequence MR imaging of the abdomen was performed both before and after the administration of intravenous contrast. Heavily T2-weighted images of the biliary and pancreatic ducts were obtained, and three-dimensional MRCP images were rendered by post processing. CONTRAST:  7.54mL GADAVIST GADOBUTROL 1 MMOL/ML IV SOLN COMPARISON:  CT 01/25/2023 FINDINGS: Lower chest:  Lung bases are clear. Hepatobiliary: No intrahepatic biliary duct dilatation. No early enhancing hepatic lesion. Gallbladder normal. The common bile duct common hepatic duct normal caliber. No choledocholithiasis. Pancreas: Pancreatic duct is normal caliber through the head and midbody. There is a large cystic collection in the mid body of the pancreas  towards the tail measuring 3.6 cm by 3.1 cm (image 17/series 3). This cystic structure is uniformly high signal intensity on T2 weighted imaging and no post-contrast enhancement. There is ductal ectasia and cystic change in the tail the pancreas (image 15/3. The large cyst in the mid pancreas does have inherent intermediate signal intensity on T1 weighted imaging demonstrate consistent with proteinaceous material within cyst. There is no post-contrast enhancement (series 101). Cyst size not changed from comparison CT. No suspicious lesions in the pancreas. There are no fluid collections outside of the pancreas. No fluid in the peritoneal retroperitoneal space. [name] Spleen: Normal spleen. Adrenals/urinary tract: Adrenal glands and kidneys are normal. Stomach/Bowel: Stomach and limited of the small bowel is unremarkable Vascular/Lymphatic: Abdominal aortic normal caliber. No retroperitoneal periportal lymphadenopathy. Musculoskeletal: No aggressive osseous lesion IMPRESSION: 1. 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 No evidence acute pancreatitis. 2. Normal biliary tree. 3. No enhancing lesion within liver. Electronically Signed   By: Genevive Bi M.D.   On: 02/09/2023 09:09   MR 3D Recon At Scanner  Result Date: 02/09/2023 CLINICAL DATA:  Persistent pancreatitis. EXAM: MRI ABDOMEN WITHOUT AND WITH CONTRAST (INCLUDING MRCP) TECHNIQUE: Multiplanar multisequence MR imaging of the abdomen was performed both before and after the administration of intravenous contrast. Heavily T2-weighted images of the biliary and pancreatic ducts were obtained, and three-dimensional MRCP images were rendered by post processing. CONTRAST:  7.97mL GADAVIST GADOBUTROL 1 MMOL/ML IV SOLN COMPARISON:  CT 01/25/2023 FINDINGS: Lower chest:  Lung bases are clear. Hepatobiliary: No intrahepatic biliary duct dilatation. No early  enhancing hepatic lesion. Gallbladder normal. The common bile duct common hepatic duct normal caliber. No choledocholithiasis. Pancreas: Pancreatic duct is normal caliber through the head and midbody. There is a large cystic collection in the mid body of the pancreas towards the tail measuring 3.6 cm by 3.1 cm (image 17/series 3). This cystic structure is uniformly high signal intensity on T2 weighted imaging and no post-contrast enhancement. There is ductal ectasia and cystic change in the tail the pancreas (image 15/3. The large cyst in the mid pancreas does have inherent intermediate signal intensity on T1 weighted imaging demonstrate consistent with proteinaceous material within cyst. There is no post-contrast enhancement (series 101). Cyst size not changed from comparison CT. No suspicious lesions in the pancreas. There are no fluid collections outside of the pancreas. No fluid in the peritoneal retroperitoneal space. [name] Spleen: Normal spleen. Adrenals/urinary tract: Adrenal glands and kidneys are normal. Stomach/Bowel: Stomach and limited of the small bowel is unremarkable Vascular/Lymphatic: Abdominal aortic normal caliber. No retroperitoneal periportal lymphadenopathy. Musculoskeletal: No aggressive osseous lesion IMPRESSION: 1. 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 No evidence acute pancreatitis. 2. Normal biliary tree. 3. No enhancing lesion within liver. Electronically Signed   By: Genevive Bi M.D.   On: 02/09/2023 09:09   CT HEAD WO CONTRAST ( )  Result Date: 02/06/2023 CLINICAL DATA:  Delirium, altered mental status.  Hypertensive. EXAM: CT HEAD WITHOUT CONTRAST TECHNIQUE: Contiguous axial images were obtained from the base of the skull through the vertex without intravenous contrast. RADIATION DOSE REDUCTION: This exam was performed according to the departmental dose-optimization  program which includes automated exposure control, adjustment of the mA and/or kV according to patient size and/or use of iterative reconstruction technique. COMPARISON:  11/05/2022. FINDINGS: Brain: No acute intracranial hemorrhage, midline shift or mass effect. No extra-axial fluid collection. Gray-white matter differentiation is within normal limits. No hydrocephalus. Vascular: No hyperdense vessel or unexpected calcification. Skull: No acute fracture. Sinuses/Orbits: No acute abnormality. There is bony deformity of the medial orbital wall on the left with fixation hardware. Other: None. IMPRESSION: No acute intracranial process. Electronically Signed   By: Thornell Sartorius M.D.   On: 02/06/2023 00:46   DG Chest 2 View  Result Date: 02/06/2023 CLINICAL DATA:  Altered mental status EXAM: CHEST - 2 VIEW COMPARISON:  01/24/2023 FINDINGS: The heart size and mediastinal contours are within normal limits. Both lungs are clear. The visualized skeletal structures are unremarkable. IMPRESSION: Normal study. Electronically Signed   By: Charlett Nose M.D.   On: 02/06/2023 00:00    Microbiology: Recent Results (from the past 240 hour(s))  Resp panel by RT-PCR (RSV, Flu A&B, Covid) Anterior Nasal Swab     Status: None   Collection Time: 02/28/23 12:34 AM   Specimen: Anterior Nasal Swab  Result Value Ref Range Status   SARS Coronavirus 2 by RT PCR NEGATIVE NEGATIVE Final    Comment: (NOTE) SARS-CoV-2 target  nucleic acids are NOT DETECTED.  The SARS-CoV-2 RNA is generally detectable in upper respiratory specimens during the acute phase of infection. The lowest concentration of SARS-CoV-2 viral copies this assay can detect is 138 copies/mL. A negative result does not preclude SARS-Cov-2 infection and should not be used as the sole basis for treatment or other patient management decisions. A negative result may occur with  improper specimen collection/handling, submission of specimen other than nasopharyngeal  swab, presence of viral mutation(s) within the areas targeted by this assay, and inadequate number of viral copies(<138 copies/mL). A negative result must be combined with clinical observations, patient history, and epidemiological information. The expected result is Negative.  Fact Sheet for Patients:  BloggerCourse.com  Fact Sheet for Healthcare Providers:  SeriousBroker.it  This test is no t yet approved or cleared by the [place] FDA and  has been authorized for detection and/or diagnosis of SARS-CoV-2 by FDA under an Emergency Use Authorization (EUA). This EUA will remain  in effect (meaning this test can be used) for the duration of the COVID-19 declaration under Section 564(b)(1) of the Act, 21 U.S.C.section 360bbb-3(b)(1), unless the authorization is terminated  or revoked sooner.       Influenza A by PCR NEGATIVE NEGATIVE Final   Influenza B by PCR NEGATIVE NEGATIVE Final    Comment: (NOTE) The Xpert Xpress SARS-CoV-2/FLU/RSV plus assay is intended as an aid in the diagnosis of influenza from Nasopharyngeal swab specimens and should not be used as a sole basis for treatment. Nasal washings and aspirates are unacceptable for Xpert Xpress SARS-CoV-2/FLU/RSV testing.  Fact Sheet for Patients: BloggerCourse.com  Fact Sheet for Healthcare Providers: SeriousBroker.it  This test is not yet approved or cleared by the [place] FDA and has been authorized for detection and/or diagnosis of SARS-CoV-2 by FDA under an Emergency Use Authorization (EUA). This EUA will remain in effect (meaning this test can be used) for the duration of the COVID-19 declaration under Section 564(b)(1) of the Act, 21 U.S.C. section 360bbb-3(b)(1), unless the authorization is terminated or revoked.     Resp Syncytial Virus by PCR NEGATIVE NEGATIVE Final    Comment: (NOTE) Fact Sheet for  Patients: BloggerCourse.com  Fact Sheet for Healthcare Providers: SeriousBroker.it  This test is not yet approved or cleared by the [place] FDA and has been authorized for detection and/or diagnosis of SARS-CoV-2 by FDA under an Emergency Use Authorization (EUA). This EUA will remain in effect (meaning this test can be used) for the duration of the COVID-19 declaration under Section 564(b)(1) of the Act, 21 U.S.C. section 360bbb-3(b)(1), unless the authorization is terminated or revoked.  Performed at [name], [address], [place], [place] [address]      Labs: Basic Metabolic Panel: Recent Labs  Lab 03/03/23 0416 03/04/23 0442 03/05/23 0807 03/06/23 0429 03/07/23 0931  NA 139 134* 134* 136 132*  K 4.6 4.2 4.3 3.9 3.7  CL 111 103 101 103 101  CO2 20* 23 24 24 23   GLUCOSE 96 97 194* 157* 142*  BUN <5* 9 11 13 10   CREATININE 0.79 0.86 0.87 0.55* 0.92  CALCIUM 9.0 8.8* 8.9 8.9 8.7*  MG 1.2* 1.8 1.3* 1.6* 1.7  PHOS  --  4.6  --   --   --    Liver Function Tests: Recent Labs  Lab 03/02/23 0430 03/04/23 0442  AST 27  --   ALT 17  --   ALKPHOS 67  --   BILITOT 0.7  --  PROT 6.9  --   ALBUMIN 3.5 3.4*   No results for input(s): "LIPASE", "AMYLASE" in the last 168 hours. No results for input(s): "AMMONIA" in the last 168 hours. CBC: Recent Labs  Lab 03/03/23 0416 03/04/23 0442 03/05/23 0807 03/06/23 0429 03/07/23 0931  WBC 6.0 6.3 8.4 7.9 11.8*  NEUTROABS 2.7  --   --   --   --   HGB 8.5* 8.1* 8.8* 7.8* 8.2*  HCT 29.2* 26.2* 29.6* 25.8* 27.3*  MCV 90.4 86.2 88.1 84.6 85.8  PLT 137* 133* 154 132* 130*   Cardiac Enzymes: No results for input(s): "CKTOTAL", "CKMB", "CKMBINDEX", "TROPONINI" in the last 168 hours. BNP: BNP (last 3 results) No results for input(s): "BNP" in the last 8760 hours.  ProBNP (last 3 results) No results for input(s): "PROBNP" in the last 8760  hours.  CBG: No results for input(s): "GLUCAP" in the last 168 hours.     Signed:  Ramiro Harvest MD.  [facility] 03/07/2023, 3:49 PM

## 2023-03-07 NOTE — Progress Notes (Deleted)
HPI :  52 year old male here for a follow-up visit for chronic pancreatitis, chronic abdominal pain.  History of peptic ulcer disease, history of alcohol and tobacco use.   See prior clinic notes for full details of his case.  He has had a history of pancreatitis in the past due to alcohol.  He has had a pretty extensive evaluation over the years as outlined below.    He has chronic abdominal pain that bothers him, mostly in his epigastric area to left mid abdomen.  Present typically all day, all the time, with fluctuations in the severity.  He feels nauseated frequently but typically does not vomit.  He is lost a few pounds over the past year.  He has chronic loose stools at baseline, has been on Creon which he states he is compliant with.  Also using Imodium which does not really help him as much.  He is on a regimen as outlined below, using Zofran for nausea which can help but he is not take it routinely.  He states he is on Protonix 40 mg once daily but still having some reflux and burning in the throat that bothering him, asking for liquid Carafate which has helped him in the past.   Recall he smokes tobacco routinely, he is down to about 1/2 pack a day from a full pack per day, he continues to struggle with this and asked for assistance.  He has not been able to stop drinking alcohol, he usually having about 3 alcoholic beverages per week although for the past week he has not used much.  He has been seen by pain management in the past, its been difficult to get him into a clinic for this.  He has used narcotics periodically in the past for it.   He was admitted in August for worsening symptoms, was in the hospital few days.  They put him on Cymbalta to help with chronic pain however it does not appear he continue that at time of discharge or at least it was stopped at some point along the line.  He is certainly willing and interested to take that again if it helps him.  We discussed his regimen at  length and long-term course.   On review of his chart he has had 8 imaging studies of his abdomen over the past year, mostly all of them show chronic pancreatitis without any other concerning or high risk lesions in his pancreas or elsewhere.    EGD 11/13/19 -  - Multiple small white plaques were found in the upper third of the esophagus, grossly consistent with esophageal candidiasis. - The exam of the esophagus was otherwise normal. - Patchy mild inflammation characterized by erythema was found in the gastric body and in the gastric antrum. - The exam of the stomach was otherwise normal. - Biopsies were taken with a cold forceps in the gastric body, at the incisura and in the gastric antrum for Helicobacter pylori testing. - One non-bleeding cratered duodenal ulcer with no stigmata of bleeding was found in the distal duodenal bulb (entering first portion duodenal sweep), surrounded by some nodular inflammatory appearing mucosa. The lesion was 4 mm in largest dimension. Biopsies were taken with a cold forceps for histology from the periphery of it / nodular mucosa. - Patchy inflammation characterized by erosions was found in the duodenal bulb. - The exam of the duodenum was otherwise normal.   Colonoscopy 11/13/19 - The perianal and digital rectal examinations were normal. - The  terminal ileum appeared normal. - Two sessile polyps were found in the transverse colon. The polyps were 3 to 7 mm in size. These polyps were removed with a cold snare. Resection and retrieval were complete. - A 3 to 4 mm polyp was found in the sigmoid colon. The polyp was sessile. The polyp was removed with a cold snare. Resection and retrieval were complete. - Internal hemorrhoids were found during retroflexion. The hemorrhoids were small. - The exam was otherwise without abnormality. There was some residual gum / seeds in the cecum which were lavaged / washed and no polyps / lesions noted in the cecum. - Biopsies  for histology were taken with a cold forceps from the right colon, left colon and transverse colon for evaluation of microscopic colitis.   1. Surgical [P], duodenal ulcer - PEPTIC DUODENITIS. EVIDENCE OF HEALING EROSION/ULCER. - WARTHIN-STARRY STAIN IS NEGATIVE FOR HELICOBACTER PYLORI. 2. Surgical [P], gastric antrum and gastric body - MILD CHRONIC GASTRITIS. MILD REACTION GASTROPATHY. - WARTHIN-STARRY STAIN IS NEGATIVE FOR HELICOBACTER PYLORI. 3. Surgical [P], colon, transverse, sigmoid, polyp (3) - TUBULAR ADENOMA, NEGATIVE FOR HIGH GRADE DYSPLASIA (X1). -HYPERPLASTIC POLYP (X2). 4. Surgical [P], colon, random sites - NO SIGNIFICANT PATHOLOGIC FINDINGS. - NEGATIVE FOR ACTIVE INFLAMMATION AND OTHER ABNORMALITIES.     MRCP 11/22/19 - IMPRESSION: 1. Findings of chronic pancreatitis with decreased and new/increased cystic lesions throughout compared to 08/08/2019 CT (given difficulty in cross modality comparison). These are likely a combination of foci of duct dilatation and intraparenchymal pseudocysts. No evidence of acute pancreatitis. 2. No biliary duct dilatation. 3.  Possible constipation. 4. Chronic gastric wall thickening, again suspicious for gastritis. 5.  Aortic Atherosclerosis (ICD10-I70.0).   Gastrin level 11/26/19 - 1635   Chromograinin A level 12/18/19 - 1296   EUS 01/02/20 - Medium sized (4.8cm) thick walled cystic lesion in the body of the pancreas. This was nearly completley aspirated, yielding 120cc of brownish, old blood tinged fluid with solid flecks of tissue which was sent for cytology, CEA and amylase. - Following cyst aspiration the pancreatic parenchyma showed pretty clear signs of chronic pancreatitis but no obvious mass lesions (except to the residual 'rind' from the cyst aspiration above). The changes of chronic pancreatitis signficantly limit the sensitivity of EUS (and other imaging modalities) to detect neolasm. - The previously noted duodenal bulb  ulcer is much improved in the interim.   Cytology  Negative Lipase / amylase level not returned      EGD 10/25/20: The Z-line was regular and was found 43 cm from the incisors. A 2 cm hiatal hernia was present. Patchy mildly erythematous mucosa without bleeding was found in the gastric body and in the gastric antrum - previously biopsied within the last 10 months so not rebiopsied. No other gross lesions were noted in the entire examined stomach. No gross lesions were noted in the duodenal bulb, in the first portion of the duodenum and in the second portion of the duodenum.   Flexible sig 10/25/20: A large amount of semi-liquid semi-solid stool was found in the entire colon, interfering with visualization. Lavage of the area was performed using copious amounts, resulting in clearance with fair visualization. A single (solitary) ten mm ulcer was found in the distal rectum. Oozing was present. It is not clear to me that this is truly a stercoral ulcer vs potential ulcer from recent enemas for today's procedure. I favor this to be from the enemas, but it needed to be treated. For hemostasis, three hemostatic clips  were successfully placed (MR conditional). There was no bleeding at the end of the procedure. Non-bleeding non-thrombosed internal hemorrhoids were found during retroflexion, during perianal exam and during digital exam. The hemorrhoids were Grade II (internal hemorrhoids that prolapse but reduce spontaneously).       Echo 02/16/22: EF 65-70%     RUQ [place] 02/17/22: IMPRESSION: Negative right upper quadrant ultrasound   CT abdomen / pelvis without contrast 01/20/22: IMPRESSION: 1. No acute intra-abdominal or pelvic pathology. No bowel obstruction. Normal appendix. 2. Sequela of chronic pancreatitis. 3. Aortic Atherosclerosis (ICD10-I70.0).   MRCP 12/22/21: IMPRESSION: 1. No evidence acute pancreatitis. Chronic cysts within the pancreatic body are improved from MRI 2021. No  new fluid collections. 2. Normal biliary tree.  No cholelithiasis. 3. No enhancing hepatic lesion. 4. Some limitation of exam due to patient being uncooperative.     CT scan abdomen / pelvis 12/11/21: IMPRESSION: 1. Slight indistinctness of peripancreatic fat planes may represent acute pancreatitis. 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 No evidence of new pancreatic collection. 2. Sequela of chronic pancreatitis with pancreatic calcifications. 3. Chronic gastric thickening, unchanged from prior exam. 4. Minimal left colonic diverticulosis without diverticulitis.   CT abdomen / pelvis 11/02/21: IMPRESSION: 1. Coarse calcifications consistent with chronic calcific pancreatitis. Questionable acute on chronic pancreatitis with trace stranding at the anterior head of the pancreas versus fat scarring from old pancreatitis. 2. 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 3. Gastroenteritis, constipation, diverticulosis and likely undigested medication in the small and large bowel. 4. Aortic atherosclerosis.     CT abdomen / pelvis 08/14/21 IMPRESSION: 1. Interval development of moderate right and small left pleural effusions with bibasilar atelectasis. 2. Sequelae of chronic pancreatitis. 3. Small lesions in the pancreas mm hypoattenuating are unchanged and may reflect chronic pseudocysts. 4. Aortic Atherosclerosis (ICD10-I70.0).     CT scan abdomen  / pelvis 06/06/21: IMPRESSION: 1. Mild acute pancreatitis. 2. 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 3. Air-fluid levels throughout small bowel loops. Findings are nonspecific, but can be seen with enteritis.   52 y.o. male here for assessment of the following   1. Alcohol-induced chronic pancreatitis (HCC)   2. Chronic  pain syndrome   3. Chronic diarrhea   4. Alcohol use disorder, severe, dependence (HCC)   5. Tobacco use disorder   6. Gastroesophageal reflux disease, unspecified whether esophagitis present     Chronic pancreatitis causing chronic pain syndrome which has been difficult to manage for him over time.  He has had numerous ED visits this year with 8 imaging studies, none of which have shown any other clear cause.  We discussed his chronic pancreatitis and course at length.  Big risk factors are continued alcohol and tobacco use, I counseled him I do not think he is going to get better unless he is able to stop these.  He needs assistance to stop them.  I will refer him to behavioral health for alcohol and tobacco use.  He is agreeable to this.  I will also prescribe him nicotine gum as he states he has a hard time affording this over-the-counter.   As far as his medical regimen for chronic pain.  He was previously given Cymbalta back in the hospital and I think this would be a very good choice for him long-term to help with his chronic  pain.  We will add back Cymbalta 30 mg daily for a few weeks and then he can increase back to 60 mg daily.  I will add Bentyl 10 mg twice daily to see if this will help his pain at all and to decrease his stools.  His Creon is based on his weight, he can use to caps with each meals, increase to 3 tabs per meal at max based on his weight.  He is having worsening reflux recently, will increase his Protonix to 40 mg twice daily and per his request we will give him some liquid Carafate to use as needed for breakthrough.  He should continue Zofran as needed.  He needs to follow-up with pain management for chronic pain if he wants to have narcotics for this long-term, I do not prescribe long-term narcotics in my clinic for this issue.  He inquires about other pain management offices, he can let me know where he wants to be seen him happy to refer him.  He agrees.   Of note, he has no  primary care physician, referred to [facility].   PLAN: - start back cymbalta 30mg  / day for a few weeks and then increase to 60mg  / day - bentyl 10mg  BID added - continue Creon - he is currently weight based, max dose is 3 tabs at a time - stop alcohol and tobacco - counseled extensively on this - refer to behavioral health for alcohol dependence - prescribe Nicotine gum - refer to [facility] - increase protonix to BID dosing - liquid carafate 10cc po q 8 hours PRN #1 bottle RF1 - continue Zofran PRN - f/u pain management     11/28/2022 CT abdomen pelvis without contrast for acute pancreatitis/pain showed stable calcifications within the pancreatic head and body consistent with chronic pancreatitis, grossly stable 3.3 cm fluid collection in pancreatic body consistent with pseudocyst liver normal, gallbladder unremarkable   Patient has had CT abdomen pelvis without contrast every month of 2024 so far other than February.  For abdominal pain, January 2 cm cystic mass distal pancreatic body, most recently 11/2020 3.3 cm. Last MRCP and EUS/2021 status post cyst aspiration which was unremarkable. 09/18/2022 CT AB for ab pain showed Changes of chronic pancreatitis with calcification. Midbody cystic lesion is noted in the pancreas stable in appearance from the priorn exam. Nonemergent MRI may be helpful for further evaluation.Gastric wall thickening with findings suspicious for ulceration. No perforation is noted.   Agree with assessment and plan as outlined with the following thoughts:   He absolutely must stop drinking alcohol and using tobacco in light of these issues which will be very difficult to manage without that happening. If he needs to see behavioral health to help with this we can refer him for that. I have referred him to chronic pain management multiple times.  I will not prescribe chronic narcotics for this issue for him, especially if he continues to drink alcohol and  smoke tobacco.  That can be managed by pain management clinic.   He just had a CT scan, I do not think we need to repeat an MRCP right now it would likely be low yield.  His pancreatic duct does not seem dilated on the CT scan. I do not know if he is a candidate for celiac plexus block, I will ask Dr. Meridee Score to get his opinion on that. Agree with trial of Lyrica in the interim. Agree with BID PPI and carafate. He has  had a few EGDs for these symptoms, I think a repeat exam is likely low yield.   MRCP 02/09/23: IMPRESSION: 1. 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 No evidence acute pancreatitis. 2. Normal biliary tree. 3. No enhancing lesion within liver.  CT 02/28/23: IMPRESSION: Changes of chronic pancreatitis with pseudocyst formation.   No acute abnormality is identified to correspond with the given clinical history.   CT abdomen / pelvis 01/25/23: IMPRESSION: 1. No acute abnormality in the abdomen or pelvis. 2. Similar mural thickening along the greater curvature of the stomach, which may be due to underdistention or gastritis. 3. Sequela of chronic pancreatitis with similar hypoattenuating lesions in the pancreatic body and tail, which may represent pseudocysts. 4.  Aortic Atherosclerosis (ICD10-I70.0).   CT abd / pelvis 01/09/23: IMPRESSION: 1. Sequela of chronic pancreatitis with multiple pseudocysts in the pancreatic tail, increased in size from the prior exam. No acute inflammatory changes are seen. 2. Mildly distended fluid-filled loops of small bowel in the pelvis measuring up to 3.3 cm. No transition point is identified. Findings may represent ileus versus partial or early small bowel obstruction. 3. Emphysema. 4. Aortic atherosclerosis.     EUS 03/06/23 - Dr. Meridee Score EGD Impression: - Tortuous esophagus. Z-line irregular, 45 cm from the  incisors. - 1 cm hiatal hernia. - Portal hypertensive gastropathy proximally. - Three non-bleeding angioectasias in the stomach. Treated with argon plasma coagulation (APC). - No gross lesions in the entire stomach. - Single duodenal angioectasia noted in D2. Treated with APC. - Congested duodenal mucosa. Biopsied. - Mucosal changes in the duodenum. EUS Impression: - A cystic lesion was seen in the pancreatic tail. Cytology results are pending. However, the endosonographic appearance is suspicious for a pancreatic pseudocyst. Fine needle aspiration for fluid performed with elevated CA19-9 level noted in bloodwork (has been elevated for years). - Pancreatic parenchymal abnormalities consisting of hyperechoic foci and lobularity with honeycombing were noted in the entire pancreas. - The pancreatic duct had a tortuous/ectatic appearance in the pancreatic head, genu of the pancreas, body of the pancreas and tail of the pancreas. - Patient has chronic pancreatitis. - Hyperechoic material consistent with sludge was visualized endosonographically in the gallbladder. - There was no sign of significant pathology in the common bile duct and in the common hepatic duct (other than prominence proximally). - No malignant-appearing lymph nodes were visualized in the celiac region (level 20), peripancreatic region and porta hepatis region. - Due to variance in anatomy from the Celiac trunk, safe endoscopic Celiac block cannot be pursued.  - Caveat is as previously noted with the extensiveness of his chronic pancreatitis, subtle lesions can be missed in the endoscopic evaluation so sensitivity of today's procedure is decreased; but other changes such as ductal dilation or overt mass-lesion development is not felt to be noted at this time.  - The patient will be observed post-procedure, until all discharge criteria are met. - Return patient to hospital ward for ongoing care. - Advance diet as tolerated. - Ciprofloxacin 500 mg BID  x 3-days to decrease risk of post-interventional infectious risk. - Complete alcohol cessation recommended. - Observe patient's clinical course. - Await cytology results. - Repeat imaging reasonable within 68-months. - Caveat is as previously noted with the extensiveness of his chronic pancreatitis, subtle lesions can be missed in the endoscopic evaluation so sensitivity of today's procedure is decreased; but other changes such as ductal dilation or  overt mass-lesion development is not felt to be noted at this time.       Past Medical History:  Diagnosis Date   Alcoholism /alcohol abuse    Anemia    Anxiety    Arthritis    knees; arms; elbows (03/26/2015)   Asthma    Bipolar disorder (HCC)    Chronic bronchitis (HCC)    Chronic lower back pain    Chronic pancreatitis (HCC)    Cocaine abuse (HCC)    Depression    Family history of adverse reaction to anesthesia    Femoral condyle fracture (HCC) 03/08/2014   left medial/notes 03/09/2014   GERD (gastroesophageal reflux disease)    H/O hiatal hernia    H/O suicide attempt 10/2012   High cholesterol    History of blood transfusion 10/2012   when I tried to commit suicide   History of stomach ulcers    Hypertension    Marijuana abuse, continuous    Migraine    a few times/year (03/26/2015)   Pancreatitis    Pneumonia 1990's X 3   PTSD (post-traumatic stress disorder)    Seizures (HCC)    Sickle cell trait (HCC)    WPW (Wolff-Parkinson-White syndrome)    [name] 03/06/2013     Past Surgical History:  Procedure Laterality Date   BIOPSY  11/25/2017   Procedure: BIOPSY;  Surgeon: [name] Modena, MD;  Location: [facility] ENDOSCOPY;  Service: Endoscopy;;   BIOPSY  10/14/2018   Procedure: BIOPSY;  Surgeon: [name] Modena, MD;  Location: [facility] ENDOSCOPY;  Service: Endoscopy;;   CARDIAC CATHETERIZATION     CYST ENTEROSTOMY  01/02/2020   Procedure: CYST ASPIRATION;  Surgeon: Rachael Fee, MD;  Location: [facility] ENDOSCOPY;  Service: Endoscopy;;    ESOPHAGOGASTRODUODENOSCOPY (EGD) WITH PROPOFOL N/A 11/25/2017   Procedure: ESOPHAGOGASTRODUODENOSCOPY (EGD) WITH PROPOFOL;  Surgeon: [name] Modena, MD;  Location[facility] ENDOSCOPY;  Service: Endoscopy;  Laterality: N/A;   ESOPHAGOGASTRODUODENOSCOPY (EGD) WITH PROPOFOL Left 10/14/2018   Procedure: ESOPHAGOGASTRODUODENOSCOPY (EGD) WITH PROPOFOL;  Surgeon: [name] Modena, MD;  Location: [facility] ENDOSCOPY;  Service: Endoscopy;  Laterality: Left;   ESOPHAGOGASTRODUODENOSCOPY (EGD) WITH PROPOFOL N/A 11/14/2018   Procedure: ESOPHAGOGASTRODUODENOSCOPY (EGD) WITH PROPOFOL;  Surgeon: Carman Ching, MD;  Location: [facility] ENDOSCOPY;  Service: Gastroenterology;  Laterality: N/A;   ESOPHAGOGASTRODUODENOSCOPY (EGD) WITH PROPOFOL N/A 01/02/2020   Procedure: ESOPHAGOGASTRODUODENOSCOPY (EGD) WITH PROPOFOL;  Surgeon: Rachael Fee, MD;  Location: [facility] ENDOSCOPY;  Service: Endoscopy;  Laterality: N/A;   ESOPHAGOGASTRODUODENOSCOPY (EGD) WITH PROPOFOL N/A 10/25/2020   Procedure: ESOPHAGOGASTRODUODENOSCOPY (EGD) WITH PROPOFOL;  Surgeon: Meridee Score Netty Starring., MD;  Location: [facility] ENDOSCOPY;  Service: Gastroenterology;  Laterality: N/A;   EUS N/A 01/02/2020   Procedure: UPPER ENDOSCOPIC ULTRASOUND (EUS) RADIAL;  Surgeon: Rachael Fee, MD;  Location: [facility] ENDOSCOPY;  Service: Endoscopy;  Laterality: N/A;   EYE SURGERY Left 1990's   "result of trauma"    FACIAL FRACTURE SURGERY Left 1990's   "result of trauma"    FLEXIBLE SIGMOIDOSCOPY N/A 10/25/2020   Procedure: FLEXIBLE SIGMOIDOSCOPY;  Surgeon: Lemar Lofty., MD;  Location: [facility] ENDOSCOPY;  Service: Gastroenterology;  Laterality: N/A;   FRACTURE SURGERY     HEMOSTASIS CLIP PLACEMENT  10/25/2020   Procedure: HEMOSTASIS CLIP PLACEMENT;  Surgeon: Lemar Lofty., MD;  Location: [facility] ENDOSCOPY;  Service: Gastroenterology;;   HERNIA REPAIR     LEFT HEART CATHETERIZATION WITH CORONARY ANGIOGRAM Right 03/07/2013   Procedure: LEFT HEART CATHETERIZATION WITH CORONARY  ANGIOGRAM;  Surgeon: Ricki Rodriguez, MD;  Location: [facility] CATH LAB;  Service: Cardiovascular;  Laterality: Right;   UMBILICAL HERNIA REPAIR     UPPER GASTROINTESTINAL ENDOSCOPY     Family History  Problem Relation Age of Onset   Hypertension Mother    Cirrhosis Father    Alcoholism Father    Hypertension Father    Melanoma Father    Hypertension Other    Coronary artery disease Other    Social History   Tobacco Use   Smoking status: Every Day    Current packs/day: 1.00    Average packs/day: 1 pack/day for 36.0 years (36.0 ttl pk-yrs)    Types: Cigarettes, E-cigarettes   Smokeless tobacco: Never  Vaping Use   Vaping status: Former  Substance Use Topics   Alcohol use: Not Currently    Alcohol/week: 4.0 standard drinks of alcohol    Types: 4 Cans of beer per week   Drug use: Not Currently    Types: Marijuana, Cocaine   No current facility-administered medications for this visit.   No current outpatient medications on file.   Facility-Administered Medications Ordered in Other Visits  Medication Dose Route Frequency Provider Last Rate Last Admin   acetaminophen (TYLENOL) tablet 650 mg  650 mg Oral Q6H PRN Bobette Mo, MD   650 mg at 03/05/23 2044   Or   acetaminophen (TYLENOL) suppository 650 mg  650 mg Rectal Q6H PRN Bobette Mo, MD       ALPRAZolam Prudy Feeler) tablet 0.5 mg  0.5 mg Oral QHS PRN Rodolph Bong, MD   0.5 mg at 03/06/23 2302   bupivacaine (PF) (MARCAINE) 0.25 % injection 20 mL  20 mL Infiltration Once Rodolph Bong, MD       Chlorhexidine Gluconate Cloth 2 % PADS 6 each  6 each Topical Daily Rodolph Bong, MD   6 each at 03/05/23 1227   ciprofloxacin (CIPRO) tablet 500 mg  500 mg Oral BID Mansouraty, Netty Starring., MD   500 mg at 03/07/23 0730   famotidine (PEPCID) tablet 20 mg  20 mg Oral BID Bobette Mo, MD   20 mg at 03/06/23 2300   fluticasone (FLONASE) 50 MCG/ACT nasal spray 2 spray  2 spray Each Nare Daily Rodolph Bong,  MD   2 spray at 03/06/23 0857   hydrALAZINE (APRESOLINE) injection 20 mg  20 mg Intravenous Q4H PRN Bobette Mo, MD   20 mg at 03/02/23 2213   hydrocortisone cream 1 %   Topical PRN Rodolph Bong, MD       HYDROmorphone (DILAUDID) injection 1 mg  1 mg Intravenous Q4H PRN Rodolph Bong, MD   1 mg at 03/07/23 0617   lipase/protease/amylase (CREON) capsule 72,000 Units  72,000 Units Oral TID WC Bobette Mo, MD   72,000 Units at 03/07/23 0730   And   lipase/protease/amylase (CREON) capsule 36,000 Units  36,000 Units Oral PRN Bobette Mo, MD       liver oil-zinc oxide (DESITIN) 40 % ointment   Topical BID Rodolph Bong, MD   Given at 03/06/23 [phone]   loperamide (IMODIUM) capsule 2 mg  2 mg Oral PRN Rodolph Bong, MD   2 mg at 03/02/23 2215   magnesium oxide (MAG-OX) tablet 800 mg  800 mg Oral BID Rodolph Bong, MD   800 mg at 03/06/23 2300   metoprolol succinate (TOPROL-XL) 24 hr tablet 200 mg  200 mg Oral Daily Bobette Mo, MD   200 mg at 03/06/23 [id number]  ondansetron (ZOFRAN) tablet 4 mg  4 mg Oral Q6H PRN Bobette Mo, MD       Or   ondansetron [facility]) injection 4 mg  4 mg Intravenous Q6H PRN Bobette Mo, MD   4 mg at 03/04/23 0940   Oral care mouth rinse  15 mL Mouth Rinse PRN Bobette Mo, MD       oxyCODONE (Oxy IR/ROXICODONE) immediate release tablet 5 mg  5 mg Oral Q4H PRN Rodolph Bong, MD   5 mg at 03/07/23 0732   pantoprazole (PROTONIX) EC tablet 40 mg  40 mg Oral BID AC Bobette Mo, MD   40 mg at 03/07/23 0730   pregabalin (LYRICA) capsule 100 mg  100 mg Oral BID Bobette Mo, MD   100 mg at 03/06/23 2300   prochlorperazine (COMPAZINE) injection 5 mg  5 mg Intravenous Q6H PRN Anthoney Harada, NP       sucralfate (CARAFATE) 1 GM/10ML suspension 1 g  1 g Oral TID WC Bobette Mo, MD   1 g at 03/07/23 0730   tamsulosin (FLOMAX) capsule 0.4 mg  0.4 mg Oral Daily Rodolph Bong, MD   0.4  mg at 03/06/23 1610   Allergies  Allergen Reactions   Robaxin [Methocarbamol] Other (See Comments)    jumpy limbs   Aspirin Other (See Comments)    Unknown reaction   Shellfish-Derived Products Nausea And Vomiting and Rash   Trazodone And Nefazodone Other (See Comments)    Muscle spasms   Adhesive [Tape] Itching   Fish-Derived Products    Latex Itching   Toradol [Ketorolac Tromethamine] Other (See Comments)    Has ulcers; cannot have this   Contrast Media [Iodinated Contrast Media] Hives   Reglan [Metoclopramide] Other (See Comments)    Muscle spasms   Salmon [Fish Oil] Nausea And Vomiting and Rash     Review of Systems: All systems reviewed and negative except where noted in HPI.    MR Lumbar Spine W Wo Contrast  Result Date: 02/28/2023 CLINICAL DATA:  52 year old male with flank and back pain, urinary retention, left lower extremity weakness. Acute myelopathy. EXAM: MRI LUMBAR SPINE WITHOUT AND WITH CONTRAST TECHNIQUE: Multiplanar and multiecho pulse sequences of the lumbar spine were obtained without and with intravenous contrast. CONTRAST:  5.76mL GADAVIST GADOBUTROL 1 MMOL/ML IV SOLN in conjunction with contrast enhanced imaging of the thoracic spine reported separately. COMPARISON:  Lumbar MRI 05/21/2022. Thoracic MRI today reported separately. CT Abdomen and Pelvis. Yesterday FINDINGS: Segmentation: Normal, concordant with the thoracic numbering today and also the previous lumbar MRI in January. Alignment: Stable straightening of lumbar lordosis, with subtle degenerative appearing retrolisthesis of L3 on L4 through L5-S1. No significant scoliosis. Vertebrae: Normal background bone marrow signal. Mild acute on chronic L5-S1 endplate degeneration including far lateral mild endplate marrow edema which is greater on the right. See additional details of that level below. No other acute osseous abnormality identified. Intact visible sacrum and SI joints. Conus medullaris and cauda  equina: Conus extends to the T12-L1 level. No convincing lower spinal cord or conus signal abnormality. No abnormal intradural enhancement. Cauda equina nerve roots are within normal limits. No dural thickening. Paraspinal and other soft tissues: Stable to the CT Abdomen and Pelvis yesterday, including abnormal pancreas with partially visible cystic masses in the tail. Negative visualized posterior paraspinal soft tissues. Disc levels: T11-T12 and T12-L1:  Negative. L1-L2: Mild disc space loss and disc bulging without stenosis, stable. L2-L3: Mild circumferential  disc bulge, facet and ligament flavum hypertrophy without stenosis is stable. L3-L4: Subtle retrolisthesis. Circumferential disc bulge with small posterior annular fissure (series 4, image 23). Mild facet and ligament flavum hypertrophy. No spinal or lateral recess stenosis. Mild L3 foraminal stenosis on the left is stable. L4-L5: Chronic disc bulging with broad-based posterior central disc protrusion and annular fissure (series 4, image 30). Ligament flavum and mild facet hypertrophy. Disc effaces the descending L5 nerve roots in the lateral recesses, without spinal stenosis. No foraminal stenosis. L5-S1: Moderate to severe disc space loss and evidence of vacuum disc. Circumferential disc osteophyte complex with a left paracentral posterior component (series 4, image 35). Chronically effaced descending left S1 nerve roots in the lateral recesses although no convincing lateral recess stenosis. No spinal stenosis. Mild to moderate bilateral L5 foraminal stenosis appears stable and greater on the right. IMPRESSION: 1. Stable lumbar spine degeneration from a January MRI with no lumbar spinal stenosis. Chronic disc degeneration which could be a source of L5 and S1 radiculitis. And up to moderate chronic bilateral L5 neural foraminal stenosis. 2. Conus medullaris and cauda equina nerve roots are stable and within normal limits. See Thoracic MRI today reported  separately. 3. Abnormal pancreas. See details on CT Abdomen and Pelvis yesterday. Electronically Signed   By: Odessa Fleming M.D.   On: 02/28/2023 05:21   MR THORACIC SPINE W WO CONTRAST  Result Date: 02/28/2023 CLINICAL DATA:  52 year old male with flank and back pain, urinary retention, left lower extremity weakness. Acute myelopathy. EXAM: MRI THORACIC WITHOUT AND WITH CONTRAST TECHNIQUE: Multiplanar and multiecho pulse sequences of the thoracic spine were obtained without and with intravenous contrast. CONTRAST:  5.77mL GADAVIST GADOBUTROL 1 MMOL/ML IV SOLN COMPARISON:  Chest radiographs 02/23/2023.  CTA chest 12/06/2021. Cervical spine CT 03/05/2020. FINDINGS: Limited cervical spine imaging: Age advanced cervical spine degeneration, appearing similar to the 2021 CT. Altered cervical vertebral marrow signal at C4 and C5 corresponds to conspicuous degenerative appearing sclerosis on the prior CT. Mild chronic spondylolisthesis. Evidence of degenerative cervical spinal stenosis at C3-C4. Thoracic spine segmentation:  Normal on the comparison CTA. Alignment: Normal thoracic kyphosis. No thoracic spondylolisthesis. Vertebrae: Normal background thoracic vertebral marrow signal. Maintained vertebral body height. No marrow edema or evidence of acute osseous abnormality. Mild chronic inferior endplate degeneration at T1. Cord: Intermittent mild motion artifact on the axial thoracic images. And on both axial images (especially series 21) and sagittal T2/stir (series 19, image 10), it is difficult to exclude scattered abnormal increased signal within the thoracic spinal cord. The levels below T10 would be spared. However, there is no cord expansion, and no evidence of abnormal spinal cord or intradural enhancement. No dural thickening. Furthermore, thoracic spinal cord volume seems relatively normal. The conus medullaris appears to remain normal at T12-L1, see also lumbar MRI today reported separately. Paraspinal and other  soft tissues: Negative. Disc levels: Capacious thoracic spinal canal little to no age advanced thoracic spine degeneration as follows T1-T2: Disc desiccation, mild disc space loss. Mild disc bulging. No stenosis. T2-T3: Disc desiccation.  Otherwise negative. T7-T8: Minor disc desiccation.  Otherwise negative. IMPRESSION: 1. Renal versus Artifactual thoracic spinal cord T2 signal heterogeneity at the levels T5 through T10. No associated cord expansion, edema, or enhancement. Subsequently, difficult to exclude chronic thoracic demyelination or transverse myelitis. 2. Capacious thoracic spinal canal with minimal for age thoracic spine degeneration. No thoracic spinal stenosis or neural impingement. 3. Partially visible chronically advanced cervical spine degeneration, similar to a 2021 CT. Suspected  degenerative spinal stenosis at C3-C4. Electronically Signed   By: Odessa Fleming M.D.   On: 02/28/2023 05:13   CT ABDOMEN PELVIS WO CONTRAST  Result Date: 02/28/2023 CLINICAL DATA:  Left-sided flank pain, history of pancreatitis, initial encounter EXAM: CT ABDOMEN AND PELVIS WITHOUT CONTRAST TECHNIQUE: Multidetector CT imaging of the abdomen and pelvis was performed following the standard protocol without IV contrast. RADIATION DOSE REDUCTION: This exam was performed according to the departmental dose-optimization program which includes automated exposure control, adjustment of the mA and/or kV according to patient size and/or use of iterative reconstruction technique. COMPARISON:  01/25/2023 FINDINGS: Lower chest: Lung bases are free of acute infiltrate or sizable effusion. Hepatobiliary: No focal liver abnormality is seen. No gallstones, gallbladder wall thickening, or biliary dilatation. Pancreas: Scattered calcifications consistent with chronic pancreatitis are again seen. Cystic lesions are noted in the body and tail of the pancreas. These are also stable from the prior exam. The largest of these measures 3.9 cm in the  pancreatic body. Spleen: Normal in size without focal abnormality. Adrenals/Urinary Tract: Adrenal glands are within normal limits.Kidneys demonstrate a normal appearance without renal calculi. No obstructive changes are seen. The bladder is well distended. Stomach/Bowel: Scattered fecal material is noted throughout the colon without obstructive change. The appendix is within normal limits. Small bowel is unremarkable. Stomach is within normal limits as well. Vascular/Lymphatic: Aortic atherosclerosis. No enlarged abdominal or pelvic lymph nodes. Reproductive: Prostate is unremarkable. Other: No abdominal wall hernia or abnormality. No abdominopelvic ascites. Musculoskeletal: No acute or significant osseous findings. IMPRESSION: Changes of chronic pancreatitis with pseudocyst formation. No acute abnormality is identified to correspond with the given clinical history. Electronically Signed   By: Alcide Clever M.D.   On: 02/28/2023 01:12   US SCROTUM W/DOPPLER  Result Date: 02/27/2023 CLINICAL DATA:  Bilateral testicular pain for 2 days. EXAM: SCROTAL ULTRASOUND DOPPLER ULTRASOUND OF THE TESTICLES TECHNIQUE: Complete ultrasound examination of the testicles, epididymis, and other scrotal structures was performed. Color and spectral Doppler ultrasound were also utilized to evaluate blood flow to the testicles. COMPARISON:  01/25/2023. FINDINGS: Right testicle Measurements: 3.0 x 1.7 x 2.3 cm. Heterogeneous echotexture, microlithiasis visualized. Left testicle Measurements: 3.4 x 1.8 x 2.3 cm. Heterogeneous echotexture, microlithiasis visualized. Right epididymis:  Normal in size and appearance. Left epididymis: Normal in size. A cyst is noted in the epididymal head measuring 0.4 x 0.3 x 0.4 cm. Hydrocele:  None visualized. Varicocele:  None visualized. Pulsed Doppler interrogation of both testes demonstrates normal low resistance arterial and venous waveforms bilaterally. IMPRESSION: 1. No evidence of testicular  torsion. 2. 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 Current literature suggests that testicular microlithiasis is not a significant independent risk factor for development of testicular carcinoma, and that follow up imaging is not warranted in the absence of other risk factors. Monthly testicular self-examination and annual physical exams are considered appropriate surveillance. If patient has other risk factors for testicular carcinoma, then referral to Urology should be considered. (Reference: DeCastro, et al.: A 5-Year Follow up Study of Asymptomatic Men with Testicular Microlithiasis. J Urol 2008; 179:1420-1423.) 3. Left epididymal head cyst. Electronically Signed   By: Thornell Sartorius M.D.   On: 02/27/2023 22:18   DG Chest 2 View  Result Date: 02/23/2023 CLINICAL DATA:  Encounter for chest pain EXAM: CHEST - 2 VIEW COMPARISON:  Two days ago FINDINGS: Generalized airway thickening. No collapse or consolidation. No edema, effusion, or pneumothorax. Normal heart size and mediastinal contours. IMPRESSION: Generalized bronchitic airway  thickening. No pneumonia or pulmonary collapse. Electronically Signed   By: Tiburcio Pea M.D.   On: 02/23/2023 04:54   DG Chest 2 View  Result Date: 02/21/2023 CLINICAL DATA:  Chest pain. Right upper quadrant and left upper quadrant abdominal pain. EXAM: CHEST - 2 VIEW COMPARISON:  02/19/2023 FINDINGS: Midline trachea. Normal heart size and mediastinal contours. No pleural effusion or pneumothorax. Mild hyperinflation and interstitial thickening, consistent with the clinical history of smoking. No superimposed consolidation. IMPRESSION: No acute cardiopulmonary disease. Peribronchial thickening which may relate to chronic bronchitis or smoking. Electronically Signed   By: Jeronimo Greaves M.D.   On: 02/21/2023 18:18   DG Chest 2 View  Result Date: 02/19/2023 CLINICAL DATA:  Chest pain. EXAM: CHEST - 2 VIEW COMPARISON:  Chest radiograph  dated 02/05/2023. FINDINGS: No focal consolidation, pleural effusion, or pneumothorax. The cardiac silhouette is within normal limits. No acute osseous pathology. IMPRESSION: No active cardiopulmonary disease. Electronically Signed   By: Elgie Collard M.D.   On: 02/19/2023 20:01   MR ABDOMEN MRCP W WO CONTAST  Result Date: 02/09/2023 CLINICAL DATA:  Persistent pancreatitis. EXAM: MRI ABDOMEN WITHOUT AND WITH CONTRAST (INCLUDING MRCP) TECHNIQUE: Multiplanar multisequence MR imaging of the abdomen was performed both before and after the administration of intravenous contrast. Heavily T2-weighted images of the biliary and pancreatic ducts were obtained, and three-dimensional MRCP images were rendered by post processing. CONTRAST:  7.13mL GADAVIST GADOBUTROL 1 MMOL/ML IV SOLN COMPARISON:  CT 01/25/2023 FINDINGS: Lower chest:  Lung bases are clear. Hepatobiliary: No intrahepatic biliary duct dilatation. No early enhancing hepatic lesion. Gallbladder normal. The common bile duct common hepatic duct normal caliber. No choledocholithiasis. Pancreas: Pancreatic duct is normal caliber through the head and midbody. There is a large cystic collection in the mid body of the pancreas towards the tail measuring 3.6 cm by 3.1 cm (image 17/series 3). This cystic structure is uniformly high signal intensity on T2 weighted imaging and no post-contrast enhancement. There is ductal ectasia and cystic change in the tail the pancreas (image 15/3. The large cyst in the mid pancreas does have inherent intermediate signal intensity on T1 weighted imaging demonstrate consistent with proteinaceous material within cyst. There is no post-contrast enhancement (series 101). Cyst size not changed from comparison CT. No suspicious lesions in the pancreas. There are no fluid collections outside of the pancreas. No fluid in the peritoneal retroperitoneal space. [name] Spleen: Normal spleen. Adrenals/urinary tract: Adrenal glands and kidneys are  normal. Stomach/Bowel: Stomach and limited of the small bowel is unremarkable Vascular/Lymphatic: Abdominal aortic normal caliber. No retroperitoneal periportal lymphadenopathy. Musculoskeletal: No aggressive osseous lesion IMPRESSION: 1. 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 No evidence acute pancreatitis. 2. Normal biliary tree. 3. No enhancing lesion within liver. Electronically Signed   By: Genevive Bi M.D.   On: 02/09/2023 09:09   MR 3D Recon At Scanner  Result Date: 02/09/2023 CLINICAL DATA:  Persistent pancreatitis. EXAM: MRI ABDOMEN WITHOUT AND WITH CONTRAST (INCLUDING MRCP) TECHNIQUE: Multiplanar multisequence MR imaging of the abdomen was performed both before and after the administration of intravenous contrast. Heavily T2-weighted images of the biliary and pancreatic ducts were obtained, and three-dimensional MRCP images were rendered by post processing. CONTRAST:  7.30mL GADAVIST GADOBUTROL 1 MMOL/ML IV SOLN COMPARISON:  CT 01/25/2023 FINDINGS: Lower chest:  Lung bases are clear. Hepatobiliary: No intrahepatic biliary duct dilatation. No early enhancing hepatic lesion. Gallbladder normal. The common  bile duct common hepatic duct normal caliber. No choledocholithiasis. Pancreas: Pancreatic duct is normal caliber through the head and midbody. There is a large cystic collection in the mid body of the pancreas towards the tail measuring 3.6 cm by 3.1 cm (image 17/series 3). This cystic structure is uniformly high signal intensity on T2 weighted imaging and no post-contrast enhancement. There is ductal ectasia and cystic change in the tail the pancreas (image 15/3. The large cyst in the mid pancreas does have inherent intermediate signal intensity on T1 weighted imaging demonstrate consistent with proteinaceous material within cyst. There is no post-contrast enhancement (series 101). Cyst  size not changed from comparison CT. No suspicious lesions in the pancreas. There are no fluid collections outside of the pancreas. No fluid in the peritoneal retroperitoneal space. [name] Spleen: Normal spleen. Adrenals/urinary tract: Adrenal glands and kidneys are normal. Stomach/Bowel: Stomach and limited of the small bowel is unremarkable Vascular/Lymphatic: Abdominal aortic normal caliber. No retroperitoneal periportal lymphadenopathy. Musculoskeletal: No aggressive osseous lesion IMPRESSION: 1. 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 No evidence acute pancreatitis. 2. Normal biliary tree. 3. No enhancing lesion within liver. Electronically Signed   By: Genevive Bi M.D.   On: 02/09/2023 09:09   CT HEAD WO CONTRAST ( )  Result Date: 02/06/2023 CLINICAL DATA:  Delirium, altered mental status.  Hypertensive. EXAM: CT HEAD WITHOUT CONTRAST TECHNIQUE: Contiguous axial images were obtained from the base of the skull through the vertex without intravenous contrast. RADIATION DOSE REDUCTION: This exam was performed according to the departmental dose-optimization program which includes automated exposure control, adjustment of the mA and/or kV according to patient size and/or use of iterative reconstruction technique. COMPARISON:  11/05/2022. FINDINGS: Brain: No acute intracranial hemorrhage, midline shift or mass effect. No extra-axial fluid collection. Gray-white matter differentiation is within normal limits. No hydrocephalus. Vascular: No hyperdense vessel or unexpected calcification. Skull: No acute fracture. Sinuses/Orbits: No acute abnormality. There is bony deformity of the medial orbital wall on the left with fixation hardware. Other: None. IMPRESSION: No acute intracranial process. Electronically Signed   By: Thornell Sartorius M.D.   On: 02/06/2023 00:46   DG Chest 2 View  Result Date:  02/06/2023 CLINICAL DATA:  Altered mental status EXAM: CHEST - 2 VIEW COMPARISON:  01/24/2023 FINDINGS: The heart size and mediastinal contours are within normal limits. Both lungs are clear. The visualized skeletal structures are unremarkable. IMPRESSION: Normal study. Electronically Signed   By: Charlett Nose M.D.   On: 02/06/2023 00:00    Physical Exam: There were no vitals taken for this visit. Constitutional: Pleasant,well-developed, ***male in no acute distress. HEENT: Normocephalic and atraumatic. Conjunctivae are normal. No scleral icterus. Neck supple.  Cardiovascular: Normal rate, regular rhythm.  Pulmonary/chest: Effort normal and breath sounds normal. No wheezing, rales or rhonchi. Abdominal: Soft, nondistended, nontender. Bowel sounds active throughout. There are no masses palpable. No hepatomegaly. Extremities: no edema Lymphadenopathy: No cervical adenopathy noted. Neurological: Alert and oriented to person place and time. Skin: Skin is warm and dry. No rashes noted. Psychiatric: Normal mood and affect. Behavior is normal.   ASSESSMENT: 52 y.o. male here for assessment of the following  No diagnosis found.  PLAN:   No ref. provider found

## 2023-03-07 NOTE — Plan of Care (Signed)
  Problem: Health Behavior/Discharge Planning: Goal: Ability to manage health-related needs will improve Outcome: Progressing   Problem: Clinical Measurements: Goal: Ability to maintain clinical measurements within normal limits will improve Outcome: Progressing Goal: Will remain free from infection Outcome: Progressing Goal: Diagnostic test results will improve Outcome: Progressing   Problem: Coping: Goal: Level of anxiety will decrease Outcome: Progressing   Problem: Elimination: Goal: Will not experience complications related to bowel motility Outcome: Progressing   Problem: Pain Management: Goal: General experience of comfort will improve Outcome: Progressing   Problem: Education: Goal: Knowledge of General Education information will improve Description: Including pain rating scale, medication(s)/side effects and non-pharmacologic comfort measures Outcome: Adequate for Discharge   Problem: Clinical Measurements: Goal: Respiratory complications will improve Outcome: Adequate for Discharge Goal: Cardiovascular complication will be avoided Outcome: Adequate for Discharge   Problem: Activity: Goal: Risk for activity intolerance will decrease Outcome: Adequate for Discharge   Problem: Elimination: Goal: Will not experience complications related to urinary retention Outcome: Adequate for Discharge   Problem: Safety: Goal: Ability to remain free from injury will improve Outcome: Adequate for Discharge   Problem: Skin Integrity: Goal: Risk for impaired skin integrity will decrease Outcome: Adequate for Discharge

## 2023-03-07 NOTE — Progress Notes (Signed)
Chaplain engaged in a follow-up visit with [name]. Chaplain was able to provide reflective listening, support, and encouragement as [name] shared about his family dynamics, health, and hopes for the future.    03/07/23 1600  Spiritual Encounters  Type of Visit Follow up  Care provided to: Patient  Reason for visit Routine spiritual support

## 2023-03-07 NOTE — TOC Progression Note (Addendum)
Transition of Care [facility]) - Progression Note    Patient Details  Name: [name] MRN: [id number] Date of Birth: May 05, 1971  Transition of Care [facility]) CM/SW Contact  Larrie Kass, LCSW Phone Number: 03/07/2023, 11:02 AM  Clinical Narrative:    CSW received another consult , pt requested to speak with SW. CSW met with pt at bedside. Pt is requesting help with retaining a lawyer. Pt reports his grandson is in CPS custody and he wants help to get him placed in his home. This CSW is unaware of any funds they can receive to obtain a Clinical research associate. CSW reference Legal Aid. Pt stated he tried Legal Aid and they will not take his case. CSW informed the pt he may have to pay privately for a lawyer. CSW referred pt to the elder law firm, to see if they can assistance.   Pt inquired about substance abuse placement. CSW reminded pt about resources that were provided on 10/24. Pt stated he called [facility] but, he stated they told him he has to be there in person. CSW provided pt the contact number to [facility] again, Pt stated he would call.   11:40 am Pt stated he called but they do not take his insurance. Pt was then referred to [facility], and CSW provided a contact number. TOC to follow.   3:30pm Pt will have to follow up with substance treatment after his OP follow up visit. Pt will d/c with catheter, CSW informed pt he will have trouble finding residential substance use facility that can provided care for this. Pt has agreed to follow up with residential substance use treatment after his urology appointment. CSW provide additional contact numbers for residential treatment. No further TOC needs TOC sign off.    Expected Discharge Plan: Home/Self Care   Expected Discharge Plan and Services       Living arrangements for the past 2 months: Single Family Home                                       Social Determinants of Health (SDOH)  Interventions SDOH Screenings   Food Insecurity: No Food Insecurity (02/28/2023)  Housing: Patient Declined (02/28/2023)  Transportation Needs: No Transportation Needs (02/28/2023)  Utilities: Not At Risk (02/28/2023)  Alcohol Screen: Medium Risk (03/14/2017)  Depression (PHQ2-9): Medium Risk (03/27/2020)  Tobacco Use: High Risk (03/06/2023)    Readmission Risk Interventions    01/10/2023    4:32 PM 01/02/2023    2:44 PM 12/16/2021    4:07 PM  Readmission Risk Prevention Plan  Transportation Screening Complete Complete Complete  Medication Review Oceanographer) Complete Complete Referral to Pharmacy  PCP or Specialist appointment within 3-5 days of discharge Complete Complete Complete  HRI or Home Care Consult Complete Complete Complete  SW Recovery Care/Counseling Consult Complete Complete Complete  Palliative Care Screening Not Applicable Not Applicable Not Applicable  Skilled Nursing Facility Not Applicable Not Applicable Not Applicable

## 2023-03-08 ENCOUNTER — Other Ambulatory Visit: Payer: Self-pay

## 2023-03-08 LAB — CYTOLOGY - NON PAP

## 2023-03-08 NOTE — Anesthesia Postprocedure Evaluation (Signed)
Anesthesia Post Note  Patient: Talus Scarfo  Procedure(s) Performed: UPPER ENDOSCOPIC ULTRASOUND (EUS) RADIAL ESOPHAGOGASTRODUODENOSCOPY (EGD) BIOPSY HOT HEMOSTASIS (ARGON PLASMA COAGULATION/BICAP) FINE NEEDLE ASPIRATION (FNA) LINEAR     Patient location during evaluation: PACU Anesthesia Type: MAC Level of consciousness: awake and alert Pain management: pain level controlled Vital Signs Assessment: post-procedure vital signs reviewed and stable Respiratory status: spontaneous breathing, nonlabored ventilation, respiratory function stable and patient connected to nasal cannula oxygen Cardiovascular status: stable and blood pressure returned to baseline Postop Assessment: no apparent nausea or vomiting Anesthetic complications: no   No notable events documented.  Last Vitals:  Vitals:   03/07/23 0617 03/07/23 1208  BP:  119/89  Pulse:  73  Resp: 12 16  Temp:  37.1 C  SpO2:      Last Pain:  Vitals:   03/07/23 1614  TempSrc:   PainSc: 6                  Mariann Barter

## 2023-03-09 ENCOUNTER — Other Ambulatory Visit (HOSPITAL_COMMUNITY): Payer: Self-pay

## 2023-03-10 ENCOUNTER — Other Ambulatory Visit: Payer: Self-pay

## 2023-03-10 ENCOUNTER — Emergency Department (HOSPITAL_COMMUNITY): Payer: MEDICAID

## 2023-03-10 ENCOUNTER — Emergency Department (HOSPITAL_COMMUNITY)
Admission: EM | Admit: 2023-03-10 | Discharge: 2023-03-11 | Disposition: A | Discharge status: Home / Self Care | Payer: MEDICAID | Attending: Emergency Medicine | Admitting: Emergency Medicine

## 2023-03-10 ENCOUNTER — Encounter (HOSPITAL_COMMUNITY): Payer: Self-pay

## 2023-03-10 DIAGNOSIS — R079 Chest pain, unspecified: Secondary | ICD-10-CM | POA: Diagnosis present

## 2023-03-10 DIAGNOSIS — L0390 Cellulitis, unspecified: Secondary | ICD-10-CM

## 2023-03-10 DIAGNOSIS — Z91040 Latex allergy status: Secondary | ICD-10-CM | POA: Insufficient documentation

## 2023-03-10 DIAGNOSIS — R1013 Epigastric pain: Secondary | ICD-10-CM | POA: Diagnosis not present

## 2023-03-10 DIAGNOSIS — R109 Unspecified abdominal pain: Secondary | ICD-10-CM

## 2023-03-10 LAB — BASIC METABOLIC PANEL
Anion gap: 17 — ABNORMAL HIGH (ref 5–15)
BUN: 24 mg/dL — ABNORMAL HIGH (ref 6–20)
CO2: 17 mmol/L — ABNORMAL LOW (ref 22–32)
Calcium: 9.6 mg/dL (ref 8.9–10.3)
Chloride: 101 mmol/L (ref 98–111)
Creatinine, Ser: 1.51 mg/dL — ABNORMAL HIGH (ref 0.61–1.24)
GFR, Estimated: 55 mL/min — ABNORMAL LOW (ref >60)
Glucose, Bld: 71 mg/dL (ref 70–99)
Potassium: 4.7 mmol/L (ref 3.5–5.1)
Sodium: 135 mmol/L (ref 135–145)

## 2023-03-10 LAB — CBC
HCT: 28.8 % — ABNORMAL LOW (ref 39.0–52.0)
Hemoglobin: 8.9 g/dL — ABNORMAL LOW (ref 13.0–17.0)
MCH: 25.1 pg — ABNORMAL LOW (ref 26.0–34.0)
MCHC: 30.9 g/dL (ref 30.0–36.0)
MCV: 81.4 fL (ref 80.0–100.0)
Platelets: 217 10*3/uL (ref 150–400)
RBC: 3.54 MIL/uL — ABNORMAL LOW (ref 4.22–5.81)
RDW: 19.7 % — ABNORMAL HIGH (ref 11.5–15.5)
WBC: 11.3 10*3/uL — ABNORMAL HIGH (ref 4.0–10.5)
nRBC: 0.2 % (ref 0.0–0.2)

## 2023-03-10 LAB — TROPONIN I (HIGH SENSITIVITY)
Troponin I (High Sensitivity): 10 ng/L (ref <18)
Troponin I (High Sensitivity): 19 ng/L — ABNORMAL HIGH (ref <18)
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
## 2023-03-10 NOTE — ED Notes (Signed)
EMS IV removed, pt placed in lobby pending room assignment.

## 2023-03-11 ENCOUNTER — Emergency Department (HOSPITAL_COMMUNITY): Payer: MEDICAID

## 2023-03-11 ENCOUNTER — Emergency Department (HOSPITAL_BASED_OUTPATIENT_CLINIC_OR_DEPARTMENT_OTHER): Payer: MEDICAID

## 2023-03-11 DIAGNOSIS — R609 Edema, unspecified: Secondary | ICD-10-CM

## 2023-03-11 LAB — D-DIMER, QUANTITATIVE: D-Dimer, Quant: 1.13 ug{FEU}/mL — ABNORMAL HIGH (ref 0.00–0.50)

## 2023-03-11 LAB — HEPATIC FUNCTION PANEL
ALT: 22 U/L (ref 0–44)
AST: 46 U/L — ABNORMAL HIGH (ref 15–41)
Albumin: 4.2 g/dL (ref 3.5–5.0)
Alkaline Phosphatase: 89 U/L (ref 38–126)
Bilirubin, Direct: 0.3 mg/dL — ABNORMAL HIGH (ref 0.0–0.2)
Indirect Bilirubin: 0.8 mg/dL (ref 0.3–0.9)
Total Bilirubin: 1.1 mg/dL (ref 0.3–1.2)
Total Protein: 8.6 g/dL — ABNORMAL HIGH (ref 6.5–8.1)

## 2023-03-11 LAB — LIPASE, BLOOD: Lipase: 19 U/L (ref 11–51)

## 2023-03-11 LAB — TROPONIN I (HIGH SENSITIVITY): Troponin I (High Sensitivity): 9 ng/L (ref <18)

## 2023-03-11 MED ORDER — OXYCODONE HCL 5 MG PO TABS: 5.0000 mg | ORAL_TABLET | ORAL | 0 refills | Status: DC | PRN

## 2023-03-11 MED ORDER — DIPHENHYDRAMINE HCL 25 MG PO CAPS
50.0000 mg | ORAL_CAPSULE | Freq: Once | ORAL | Status: AC
Start: 2023-03-11 — End: 2023-03-11
  Administered 2023-03-11: 50 mg via ORAL
  Filled 2023-03-11: qty 2

## 2023-03-11 MED ORDER — LIDOCAINE VISCOUS HCL 2 % MT SOLN
15.0000 mL | Freq: Once | OROMUCOSAL | Status: AC
  Administered 2023-03-11: 15 mL via ORAL
  Filled 2023-03-11: qty 15

## 2023-03-11 MED ORDER — SODIUM CHLORIDE 0.9 % IV BOLUS
500.0000 mL | Freq: Once | INTRAVENOUS | Status: AC
  Administered 2023-03-11: 500 mL via INTRAVENOUS

## 2023-03-11 MED ORDER — DIPHENHYDRAMINE HCL 50 MG/ML IJ SOLN
50.0000 mg | Freq: Once | INTRAMUSCULAR | Status: AC
Start: 2023-03-11 — End: 2023-03-11

## 2023-03-11 MED ORDER — DOXYCYCLINE HYCLATE 100 MG PO CAPS: 100.0000 mg | ORAL_CAPSULE | Freq: Two times a day (BID) | ORAL | 0 refills | Status: AC

## 2023-03-11 MED ORDER — DOXYCYCLINE HYCLATE 100 MG PO TABS
100.0000 mg | ORAL_TABLET | Freq: Once | ORAL | Status: AC
  Administered 2023-03-11: 100 mg via ORAL
  Filled 2023-03-11: qty 1

## 2023-03-11 MED ORDER — ALUM & MAG HYDROXIDE-SIMETH 200-200-20 MG/5ML PO SUSP
30.0000 mL | Freq: Once | ORAL | Status: AC
Start: 2023-03-11 — End: 2023-03-11
  Administered 2023-03-11: 30 mL via ORAL
  Filled 2023-03-11: qty 30

## 2023-03-11 MED ORDER — METHYLPREDNISOLONE SODIUM SUCC 40 MG IJ SOLR
40.0000 mg | Freq: Once | INTRAMUSCULAR | Status: AC
  Administered 2023-03-11: 40 mg via INTRAVENOUS
  Filled 2023-03-11: qty 1

## 2023-03-11 MED ORDER — OXYCODONE HCL 5 MG PO TABS
5.0000 mg | ORAL_TABLET | Freq: Once | ORAL | Status: AC
  Administered 2023-03-11: 5 mg via ORAL
  Filled 2023-03-11: qty 1

## 2023-03-11 MED ORDER — IOHEXOL 350 MG/ML SOLN
65.0000 mL | Freq: Once | INTRAVENOUS | Status: AC | PRN
  Administered 2023-03-11: 65 mL via INTRAVENOUS

## 2023-03-11 NOTE — ED Notes (Signed)
Pt given water for PO challenge, no issues with drinking.

## 2023-03-11 NOTE — ED Provider Notes (Signed)
[place] EMERGENCY DEPARTMENT AT [facility] Provider Note   CSN: [id number] Arrival date & time: 03/10/23  2020     History  Chief Complaint  Patient presents with   Chest Pain    [name] is a 52 y.o. male.  The history is provided by the patient and medical records.  Chest Pain [name] is a 52 y.o. male who presents to the Emergency Department complaining of chest pain and abdominal pain.  He has a history of recurrent pancreatitis, urinary retention with recent catheter placement and recent hospitalization for acute kidney injury.  He states that since hospital discharge she has experienced increased pain and redness to his left calf.  He also reports central and right-sided chest pain since Wednesday that is described as sharp and stabbing with associated dyspnea on exertion.  He also complains of sharp epigastric pain that radiates to his left flank, similar to pancreatitis flares.  He does continue to drink 1 large beer daily.  He also uses cocaine, last use was 2 days ago  Has nausea, no vomiting. No fevers.     Home Medications Prior to Admission medications   Medication Sig Start Date End Date Taking? Authorizing Provider  famotidine (PEPCID) 20 MG tablet Take 1 tablet (20 mg total) by mouth 2 (two) times daily. 03/07/23   Rodolph Bong, MD  fluticasone (FLONASE) 50 MCG/ACT nasal spray Place 2 sprays into both nostrils 2 (two) times daily as needed for allergies.    [provider]  folic acid (FOLVITE) 1 MG tablet Take 1 tablet (1 mg total) by mouth daily. 09/21/22   Pokhrel, Rebekah Chesterfield, MD  hydrocortisone cream 1 % Apply topically as needed for itching. 03/07/23   Rodolph Bong, MD  lipase/protease/amylase (CREON) 36000 UNITS CPEP capsule Take 2 capsules (72,000 Units total) by mouth 3 (three) times daily with meals. May also take 1 capsule (36,000 Units total) as needed (with snacks). 01/26/23   Jerald Kief, MD   liver oil-zinc oxide (DESITIN) 40 % ointment Apply topically 2 (two) times daily. Apply to buttocks 03/07/23   Rodolph Bong, MD  loperamide (IMODIUM) 2 MG capsule Take 1 capsule (2 mg total) by mouth as needed for diarrhea or loose stools. 03/07/23   Rodolph Bong, MD  magnesium oxide (MAG-OX) 400 (240 Mg) MG tablet Take 2 tablets (800 mg total) by mouth 2 (two) times daily for 7 days. 03/07/23 03/14/23  Rodolph Bong, MD  metoprolol (TOPROL-XL) 200 MG 24 hr tablet Take 1 tablet (200 mg total) by mouth daily. 03/07/23   Rodolph Bong, MD  nitroGLYCERIN (NITROSTAT) 0.4 MG SL tablet Place 0.4 mg under the tongue every 5 (five) minutes as needed for chest pain.    [provider]  ondansetron (ZOFRAN-ODT) 4 MG disintegrating tablet Take 1 tablet (4 mg total) by mouth 2 (two) times daily as needed for nausea or vomiting. Patient taking differently: Take 4 mg by mouth as needed for nausea or vomiting. 01/26/23   Jerald Kief, MD  oxyCODONE (OXY IR/ROXICODONE) 5 MG immediate release tablet Take 1 tablet (5 mg total) by mouth every 4 (four) hours as needed for breakthrough pain or moderate pain (pain score 4-6). 03/07/23   Rodolph Bong, MD  pantoprazole (PROTONIX) 40 MG tablet Take 1 tablet (40 mg total) by mouth 2 (two) times daily before a meal. 03/07/23   Rodolph Bong, MD  pregabalin (LYRICA) 100 MG capsule Take  100 mg by mouth 2 (two) times daily. 02/26/23   [provider]  Simethicone 40 MG/0.6ML LIQD Take 0.6 mLs (40 mg total) by mouth in the morning, at noon, and at bedtime for 7 days. Patient taking differently: Take 40 mg by mouth 3 (three) times daily as needed (for bloating and gas). 02/11/23 02/21/24  Dahal, Melina Schools, MD  sucralfate (CARAFATE) 1 GM/10ML suspension Take 10 mLs (1 g total) by mouth See admin instructions. Take 4 times with meals and at bedtime. Patient taking differently: Take 1 g by mouth See admin instructions. Take 1g by mouth  three times a day with meals.  May take an additional 1g with a snack. 01/11/23   Jerald Kief, MD  tamsulosin (FLOMAX) 0.4 MG CAPS capsule Take 1 capsule (0.4 mg total) by mouth daily. 03/08/23   Rodolph Bong, MD  amitriptyline (ELAVIL) 25 MG tablet Take 1 tablet (25 mg total) by mouth at bedtime. Patient not taking: Reported on 08/08/2019 10/15/18 08/08/19  Rolly Salter, MD      Allergies    Robaxin [methocarbamol], Aspirin, Shellfish-derived products, Trazodone and nefazodone, Adhesive [tape], Fish-derived products, Latex, Toradol [ketorolac tromethamine], Contrast media [iodinated contrast media], Reglan [metoclopramide], and Salmon [fish oil]    Review of Systems   Review of Systems  Cardiovascular:  Positive for chest pain.  All other systems reviewed and are negative.   Physical Exam Updated Vital Signs BP (!) 134/98   Pulse 80   Temp (!) 97.5 F (36.4 C) (Oral)   Resp 15   SpO2 100%  Physical Exam Vitals and nursing note reviewed.  Constitutional:      Appearance: He is well-developed.  HENT:     Head: Normocephalic and atraumatic.  Cardiovascular:     Rate and Rhythm: Normal rate and regular rhythm.     Heart sounds: No murmur heard. Pulmonary:     Effort: Pulmonary effort is normal. No respiratory distress.     Breath sounds: Normal breath sounds.  Abdominal:     Palpations: Abdomen is soft.     Tenderness: There is no guarding or rebound.     Comments: Mild epigastric tenderness  Musculoskeletal:        General: No tenderness.     Comments: Erythema warmth and tenderness to the left calf  Skin:    General: Skin is warm and dry.  Neurological:     Mental Status: He is alert and oriented to person, place, and time.  Psychiatric:        Behavior: Behavior normal.     ED Results / Procedures / Treatments   Labs (all labs ordered are listed, but only abnormal results are displayed) Labs Reviewed  CBC - Abnormal; Notable for the following components:       Result Value   WBC 11.3 (*)    RBC 3.54 (*)    Hemoglobin 8.9 (*)    HCT 28.8 (*)    MCH 25.1 (*)    RDW 19.7 (*)    All other components within normal limits  BASIC METABOLIC PANEL - Abnormal; Notable for the following components:   CO2 17 (*)    BUN 24 (*)    Creatinine, Ser 1.51 (*)    GFR, Estimated 55 (*)    Anion gap 17 (*)    All other components within normal limits  HEPATIC FUNCTION PANEL - Abnormal; Notable for the following components:   Total Protein 8.6 (*)    AST 46 (*)  Bilirubin, Direct 0.3 (*)    All other components within normal limits  D-DIMER, QUANTITATIVE - Abnormal; Notable for the following components:   D-Dimer, Quant 1.13 (*)    All other components within normal limits  TROPONIN I (HIGH SENSITIVITY) - Abnormal; Notable for the following components:   Troponin I (High Sensitivity) 19 (*)    All other components within normal limits  LIPASE, BLOOD  TROPONIN I (HIGH SENSITIVITY)  TROPONIN I (HIGH SENSITIVITY)    EKG None  Radiology DG Chest 2 View  Result Date: 03/10/2023 CLINICAL DATA:  Chest pain EXAM: CHEST - 2 VIEW COMPARISON:  02/23/2023 FINDINGS: The heart size and mediastinal contours are within normal limits. Both lungs are clear. The visualized skeletal structures are unremarkable. IMPRESSION: No active cardiopulmonary disease. Electronically Signed   By: Charlett Nose M.D.   On: 03/10/2023 22:14    Procedures Procedures    Medications Ordered in ED Medications  sodium chloride 0.9 % bolus 500 mL (0 mLs Intravenous Stopped 03/11/23 0527)  oxyCODONE (Oxy IR/ROXICODONE) immediate release tablet 5 mg (5 mg Oral Given 03/11/23 0157)  methylPREDNISolone sodium succinate (SOLU-MEDROL) 40 mg/mL injection 40 mg (40 mg Intravenous Given 03/11/23 0332)  diphenhydrAMINE (BENADRYL) capsule 50 mg (50 mg Oral Given 03/11/23 0641)    Or  diphenhydrAMINE (BENADRYL) injection 50 mg ( Intravenous See Alternative 03/11/23 0641)  doxycycline  (VIBRA-TABS) tablet 100 mg (100 mg Oral Given 03/11/23 0749)    ED Course/ Medical Decision Making/ A&P                                 Medical Decision Making Amount and/or Complexity of Data Reviewed Labs: ordered. Radiology: ordered.  Risk Prescription drug management.   Patient with history of cocaine abuse, alcohol abuse here for evaluation of chest pain, abdominal pain.  In terms of his chest pain, EKG is without acute ischemic changes.  Initial troponin is negative, repeat is mildly elevated and third is negative again.  Presentation is not consistent with ACS.  He does have unilateral lower extremity swelling and pain.  Would start on antibiotics.  D-dimer was elevated, plan to obtain vascular ultrasound as well as CTA to rule out DVT/PE.  Patient care transferred pending additional imaging.  Patient will also require p.o. challenge as well as voiding trial.  He did request that his catheter was removed.  Labs with mild AKI.  He was treated with IV fluid hydration.        Final Clinical Impression(s) / ED Diagnoses Final diagnoses:  None    Rx / DC Orders ED Discharge Orders     None         Tilden Fossa, MD 03/11/23 [phone]

## 2023-03-11 NOTE — ED Notes (Signed)
Pt able to void at this time after foley removal.

## 2023-03-11 NOTE — ED Notes (Signed)
Unable to locate pt, pt clothes at bedside, not in either bathroom in blue/orange, attempting to locate pt for med administration and discharge.

## 2023-03-11 NOTE — Discharge Instructions (Signed)
Your workup today showed evidence of cellulitis in the leg but no other concerning findings such as blood clot seen on the CT imaging.  The previous team feel you are safe for discharge home but please take antibiotics to treat the leg cellulitis.  Please use the pain medicines up with symptoms and rest and stay hydrated.  If any symptoms change or worsen acutely, please return to the nearest emergency department and please follow-up with your primary doctor.

## 2023-03-11 NOTE — Progress Notes (Signed)
VASCULAR LAB    Left lower extremity venous duplex has been performed.  See CV proc for preliminary results.  Messaged negative results to Dr. Rush Landmark via secure chat.  Indianna Boran, RVT 03/11/2023, 9:29 AM

## 2023-03-11 NOTE — ED Notes (Signed)
Pt given water for PO challenge 

## 2023-03-11 NOTE — ED Notes (Signed)
Pt given sandwich and ginger ale.

## 2023-03-11 NOTE — ED Provider Notes (Signed)
7:43 AM Care assumed from Dr. Madilyn Hook.  At time of transfer of care, patient is waiting for results of CT PE study to be obtained approximately 7:30 AM, DVT ultrasound, and reassessment.  Plan of care to rule out thromboembolic disease and if all negative, plan for discharge home with likely antibiotics for cellulitis of the calf.  12:06 PM CT scan showed no acute pulmonary embolism.  Per plan, will give prescription for doxycycline and he will be discharged home.  Patient requested another GI cocktail which was given.  He we will get several doses of pain medicine and he is post to see his team soon.  He agreed with plan of care and was discharged in good condition.  Clinical Impression: 1. Cellulitis, unspecified cellulitis site   2. Chest pain, unspecified type   3. Abdominal pain, unspecified abdominal location     Disposition: Discharge  Condition: Good  I have discussed the results, Dx and Tx plan with the pt(& family if present). He/she/they expressed understanding and agree(s) with the plan. Discharge instructions discussed at great length. Strict return precautions discussed and pt &/or family have verbalized understanding of the instructions. No further questions at time of discharge.    New Prescriptions   DOXYCYCLINE (VIBRAMYCIN) 100 MG CAPSULE    Take 1 capsule (100 mg total) by mouth 2 (two) times daily for 7 days.   OXYCODONE (ROXICODONE) 5 MG IMMEDIATE RELEASE TABLET    Take 1 tablet (5 mg total) by mouth every 4 (four) hours as needed for severe pain (pain score 7-10).    Follow Up: [facility] [address] [place] [address] [place] [address] [phone] Schedule an appointment as soon as possible for a visit       Alarik Radu, Canary Brim, MD 03/11/23 1206

## 2023-03-11 NOTE — ED Notes (Signed)
Patient transported to CT 

## 2023-03-11 NOTE — ED Notes (Signed)
Pt tx to vascular

## 2023-03-17 ENCOUNTER — Other Ambulatory Visit: Payer: Self-pay

## 2023-03-17 ENCOUNTER — Encounter (HOSPITAL_COMMUNITY): Payer: Self-pay

## 2023-03-17 ENCOUNTER — Emergency Department (HOSPITAL_COMMUNITY)
Admission: EM | Admit: 2023-03-17 | Discharge: 2023-03-17 | Disposition: A | Discharge status: Home / Self Care | Payer: MEDICAID | Attending: Emergency Medicine | Admitting: Emergency Medicine

## 2023-03-17 DIAGNOSIS — Z79899 Other long term (current) drug therapy: Secondary | ICD-10-CM | POA: Diagnosis not present

## 2023-03-17 DIAGNOSIS — Z7951 Long term (current) use of inhaled steroids: Secondary | ICD-10-CM | POA: Diagnosis not present

## 2023-03-17 DIAGNOSIS — J45909 Unspecified asthma, uncomplicated: Secondary | ICD-10-CM | POA: Diagnosis not present

## 2023-03-17 DIAGNOSIS — R079 Chest pain, unspecified: Secondary | ICD-10-CM | POA: Diagnosis present

## 2023-03-17 DIAGNOSIS — I10 Essential (primary) hypertension: Secondary | ICD-10-CM | POA: Insufficient documentation

## 2023-03-17 DIAGNOSIS — R0789 Other chest pain: Secondary | ICD-10-CM | POA: Insufficient documentation

## 2023-03-17 DIAGNOSIS — R1013 Epigastric pain: Secondary | ICD-10-CM | POA: Insufficient documentation

## 2023-03-17 DIAGNOSIS — Z91040 Latex allergy status: Secondary | ICD-10-CM | POA: Diagnosis not present

## 2023-03-17 LAB — COMPREHENSIVE METABOLIC PANEL
ALT: 19 U/L (ref 0–44)
AST: 41 U/L (ref 15–41)
Albumin: 4 g/dL (ref 3.5–5.0)
Alkaline Phosphatase: 81 U/L (ref 38–126)
Anion gap: 12 (ref 5–15)
BUN: 16 mg/dL (ref 6–20)
CO2: 20 mmol/L — ABNORMAL LOW (ref 22–32)
Calcium: 9.4 mg/dL (ref 8.9–10.3)
Chloride: 103 mmol/L (ref 98–111)
Creatinine, Ser: 1.21 mg/dL (ref 0.61–1.24)
GFR, Estimated: >60 mL/min (ref >60)
Glucose, Bld: 94 mg/dL (ref 70–99)
Potassium: 6 mmol/L — ABNORMAL HIGH (ref 3.5–5.1)
Sodium: 135 mmol/L (ref 135–145)
Total Bilirubin: 0.6 mg/dL (ref <1.2)
Total Protein: 7.9 g/dL (ref 6.5–8.1)

## 2023-03-17 LAB — CBC WITH DIFFERENTIAL/PLATELET
Abs Immature Granulocytes: 0.04 10*3/uL (ref 0.00–0.07)
Basophils Absolute: 0 10*3/uL (ref 0.0–0.1)
Basophils Relative: 0 %
Eosinophils Absolute: 0.1 10*3/uL (ref 0.0–0.5)
Eosinophils Relative: 2 %
HCT: 34.4 % — ABNORMAL LOW (ref 39.0–52.0)
Hemoglobin: 10.6 g/dL — ABNORMAL LOW (ref 13.0–17.0)
Immature Granulocytes: 1 %
Lymphocytes Relative: 33 %
Lymphs Abs: 2.4 10*3/uL (ref 0.7–4.0)
MCH: 25.2 pg — ABNORMAL LOW (ref 26.0–34.0)
MCHC: 30.8 g/dL (ref 30.0–36.0)
MCV: 81.9 fL (ref 80.0–100.0)
Monocytes Absolute: 0.7 10*3/uL (ref 0.1–1.0)
Monocytes Relative: 9 %
Neutro Abs: 4 10*3/uL (ref 1.7–7.7)
Neutrophils Relative %: 55 %
Platelets: 410 10*3/uL — ABNORMAL HIGH (ref 150–400)
RBC: 4.2 MIL/uL — ABNORMAL LOW (ref 4.22–5.81)
RDW: 20.2 % — ABNORMAL HIGH (ref 11.5–15.5)
WBC: 7.3 10*3/uL (ref 4.0–10.5)
nRBC: 0.4 % — ABNORMAL HIGH (ref 0.0–0.2)

## 2023-03-17 LAB — I-STAT CHEM 8, ED
BUN: 15 mg/dL (ref 6–20)
Calcium, Ion: 1.12 mmol/L — ABNORMAL LOW (ref 1.15–1.40)
Chloride: 105 mmol/L (ref 98–111)
Creatinine, Ser: 1.2 mg/dL (ref 0.61–1.24)
Glucose, Bld: 97 mg/dL (ref 70–99)
HCT: 35 % — ABNORMAL LOW (ref 39.0–52.0)
Hemoglobin: 11.9 g/dL — ABNORMAL LOW (ref 13.0–17.0)
Potassium: 4.5 mmol/L (ref 3.5–5.1)
Sodium: 138 mmol/L (ref 135–145)
TCO2: 25 mmol/L (ref 22–32)

## 2023-03-17 LAB — LIPASE, BLOOD: Lipase: 21 U/L (ref 11–51)

## 2023-03-17 LAB — TROPONIN I (HIGH SENSITIVITY)
Troponin I (High Sensitivity): 3 ng/L (ref <18)
Troponin I (High Sensitivity): 4 ng/L (ref <18)

## 2023-03-17 MED ORDER — ALUM & MAG HYDROXIDE-SIMETH 200-200-20 MG/5ML PO SUSP
30.0000 mL | Freq: Once | ORAL | Status: AC
  Administered 2023-03-17: 30 mL via ORAL
  Filled 2023-03-17: qty 30

## 2023-03-17 MED ORDER — ACETAMINOPHEN 500 MG PO TABS: 1000.0000 mg | ORAL_TABLET | Freq: Once | ORAL | Status: DC

## 2023-03-17 NOTE — ED Provider Notes (Signed)
[place] EMERGENCY DEPARTMENT AT [facility] Provider Note   CSN: [id number] Arrival date & time: 03/17/23  4098     History  Chief Complaint  Patient presents with   Chest Pain    [name] is a 52 y.o. male with chronic pancreatitis, SAD, WPW,  and recent admission to [facility] 10/21 for CP presents with complaints of epigastric abdominal pain and chest pain that started this morning at 1 AM.  Patient states the symptoms woke him from his sleep.  Chest pain radiates to his left shoulder.  There is no exertional component.  He states he took his pantoprazole and Carafate without notable improvement.  States the symptoms feel like his typical pancreatitis episodes.  He was discharged from [facility] on 10/29 and presented to this facility on 11/2 with recurrence in symptoms.   Chest Pain  Past Medical History:  Diagnosis Date   Alcoholism /alcohol abuse    Anemia    Anxiety    Arthritis    knees; arms; elbows (03/26/2015)   Asthma    Bipolar disorder (HCC)    Chronic bronchitis (HCC)    Chronic lower back pain    Chronic pancreatitis (HCC)    Cocaine abuse (HCC)    Depression    Family history of adverse reaction to anesthesia    Femoral condyle fracture (HCC) 03/08/2014   left medial/notes 03/09/2014   GERD (gastroesophageal reflux disease)    H/O hiatal hernia    H/O suicide attempt 10/2012   High cholesterol    History of blood transfusion 10/2012   when I tried to commit suicide   History of stomach ulcers    Hypertension    Marijuana abuse, continuous    Migraine    a few times/year (03/26/2015)   Pancreatitis    Pneumonia 1990's X 3   PTSD (post-traumatic stress disorder)    Seizures (HCC)    Sickle cell trait (HCC)    WPW (Wolff-Parkinson-White syndrome)    [name] 03/06/2013       Home Medications Prior to Admission medications   Medication Sig Start Date End Date Taking? Authorizing Provider  doxycycline (VIBRAMYCIN) 100  MG capsule Take 1 capsule (100 mg total) by mouth 2 (two) times daily for 7 days. 03/11/23 03/18/23  Tegeler, Canary Brim, MD  famotidine (PEPCID) 20 MG tablet Take 1 tablet (20 mg total) by mouth 2 (two) times daily. 03/07/23   Rodolph Bong, MD  fluticasone (FLONASE) 50 MCG/ACT nasal spray Place 2 sprays into both nostrils 2 (two) times daily as needed for allergies.    [provider]  folic acid (FOLVITE) 1 MG tablet Take 1 tablet (1 mg total) by mouth daily. 09/21/22   Pokhrel, Rebekah Chesterfield, MD  hydrocortisone cream 1 % Apply topically as needed for itching. 03/07/23   Rodolph Bong, MD  lipase/protease/amylase (CREON) 36000 UNITS CPEP capsule Take 2 capsules (72,000 Units total) by mouth 3 (three) times daily with meals. May also take 1 capsule (36,000 Units total) as needed (with snacks). 01/26/23   Jerald Kief, MD  liver oil-zinc oxide (DESITIN) 40 % ointment Apply topically 2 (two) times daily. Apply to buttocks Patient not taking: Reported on 03/11/2023 03/07/23   Rodolph Bong, MD  loperamide (IMODIUM) 2 MG capsule Take 1 capsule (2 mg total) by mouth as needed for diarrhea or loose stools. 03/07/23   Rodolph Bong, MD  metoprolol (TOPROL-XL) 200 MG 24 hr tablet Take 1 tablet (  200 mg total) by mouth daily. 03/07/23   Rodolph Bong, MD  nitroGLYCERIN (NITROSTAT) 0.4 MG SL tablet Place 0.4 mg under the tongue every 5 (five) minutes as needed for chest pain. Patient not taking: Reported on 03/11/2023    [provider]  ondansetron (ZOFRAN-ODT) 4 MG disintegrating tablet Take 1 tablet (4 mg total) by mouth 2 (two) times daily as needed for nausea or vomiting. Patient taking differently: Take 4 mg by mouth as needed for nausea or vomiting. 01/26/23   Jerald Kief, MD  oxyCODONE (OXY IR/ROXICODONE) 5 MG immediate release tablet Take 1 tablet (5 mg total) by mouth every 4 (four) hours as needed for breakthrough pain or moderate pain (pain score 4-6). Patient  not taking: Reported on 03/11/2023 03/07/23   Rodolph Bong, MD  oxyCODONE (ROXICODONE) 5 MG immediate release tablet Take 1 tablet (5 mg total) by mouth every 4 (four) hours as needed for severe pain (pain score 7-10). 03/11/23   Tegeler, Canary Brim, MD  pantoprazole (PROTONIX) 40 MG tablet Take 1 tablet (40 mg total) by mouth 2 (two) times daily before a meal. 03/07/23   Rodolph Bong, MD  pregabalin (LYRICA) 100 MG capsule Take 100 mg by mouth 2 (two) times daily. 02/26/23   [provider]  Simethicone 40 MG/0.6ML LIQD Take 0.6 mLs (40 mg total) by mouth in the morning, at noon, and at bedtime for 7 days. Patient taking differently: Take 40 mg by mouth 3 (three) times daily as needed (for bloating and gas). 02/11/23 02/21/24  Dahal, Melina Schools, MD  sucralfate (CARAFATE) 1 GM/10ML suspension Take 10 mLs (1 g total) by mouth See admin instructions. Take 4 times with meals and at bedtime. Patient taking differently: Take 1 g by mouth See admin instructions. Take 1g by mouth three times a day with meals.  May take an additional 1g with a snack. 01/11/23   Jerald Kief, MD  tamsulosin (FLOMAX) 0.4 MG CAPS capsule Take 1 capsule (0.4 mg total) by mouth daily. 03/08/23   Rodolph Bong, MD  amitriptyline (ELAVIL) 25 MG tablet Take 1 tablet (25 mg total) by mouth at bedtime. Patient not taking: Reported on 08/08/2019 10/15/18 08/08/19  Rolly Salter, MD      Allergies    Robaxin [methocarbamol], Aspirin, Shellfish-derived products, Trazodone and nefazodone, Adhesive [tape], Fish-derived products, Latex, Toradol [ketorolac tromethamine], Contrast media [iodinated contrast media], Reglan [metoclopramide], and Salmon [fish oil]    Review of Systems   Review of Systems  Cardiovascular:  Positive for chest pain.    Physical Exam Updated Vital Signs BP (!) 136/93   Pulse 76   Temp (!) 97.5 F (36.4 C) (Oral)   Resp 16   Ht 5\' 8"  (1.727 m)   Wt 55 kg   SpO2 100%   BMI 18.44 kg/m   Physical Exam Vitals and nursing note reviewed.  Constitutional:      General: He is not in acute distress.    Appearance: He is well-developed.  HENT:     Head: Normocephalic and atraumatic.  Eyes:     Conjunctiva/sclera: Conjunctivae normal.  Cardiovascular:     Rate and Rhythm: Normal rate and regular rhythm.     Heart sounds: No murmur heard. Pulmonary:     Effort: Pulmonary effort is normal. No respiratory distress.     Breath sounds: Normal breath sounds.  Chest:     Chest wall: No tenderness.  Abdominal:     Palpations: Abdomen  is soft.     Comments: Epigastric tenderness  Musculoskeletal:        General: No swelling.     Cervical back: Neck supple.     Right lower leg: No edema.     Left lower leg: No edema.  Skin:    General: Skin is warm and dry.     Capillary Refill: Capillary refill takes less than 2 seconds.  Neurological:     Mental Status: He is alert.  Psychiatric:        Mood and Affect: Mood normal.     ED Results / Procedures / Treatments   Labs (all labs ordered are listed, but only abnormal results are displayed) Labs Reviewed  CBC WITH DIFFERENTIAL/PLATELET - Abnormal; Notable for the following components:      Result Value   RBC 4.20 (*)    Hemoglobin 10.6 (*)    HCT 34.4 (*)    MCH 25.2 (*)    RDW 20.2 (*)    Platelets 410 (*)    nRBC 0.4 (*)    All other components within normal limits  COMPREHENSIVE METABOLIC PANEL - Abnormal; Notable for the following components:   Potassium 6.0 (*)    CO2 20 (*)    All other components within normal limits  I-STAT CHEM 8, ED - Abnormal; Notable for the following components:   Calcium, Ion 1.12 (*)    Hemoglobin 11.9 (*)    HCT 35.0 (*)    All other components within normal limits  LIPASE, BLOOD  TROPONIN I (HIGH SENSITIVITY)  TROPONIN I (HIGH SENSITIVITY)    EKG EKG Interpretation Date/Time:  Friday March 17 2023 09:58:01 EST Ventricular Rate:  83 PR Interval:  108 QRS  Duration:  93 QT Interval:  360 QTC Calculation: 423 R Axis:   70  Text Interpretation: Sinus rhythm Ventricular trigeminy Short PR interval LAE, consider biatrial enlargement Borderline repolarization abnormality since last tracing no significant change Confirmed by Eber Hong ([id number]) on 03/17/2023 10:17:09 AM  Radiology No results found.  Procedures Procedures    Medications Ordered in ED Medications  acetaminophen (TYLENOL) tablet 1,000 mg (1,000 mg Oral Not Given 03/17/23 1144)  alum & mag hydroxide-simeth (MAALOX/MYLANTA) 200-200-20 MG/5ML suspension 30 mL (30 mLs Oral Given 03/17/23 1033)    ED Course/ Medical Decision Making/ A&P Clinical Course as of 03/17/23 1339  Fri Mar 17, 2023  1109 EKG normal sinus rhythm without ischemic changes.  He is hemodynamically stable will start with basic labs as he had extensive workup a few days ago at [facility]. [JT]  1231 Troponin notably without elevation, other lab work without significant abnormality as well [JT]    Clinical Course User Index [JT] Halford Decamp, PA-C                                 Medical Decision Making [name] is a 52 y.o. male with chronic pancreatitis, SAD, and recent admission to [facility] for CP presents with complaints of epigastric abdominal pain and chest pain that started this morning at 1 AM.  Differential includes ACS, PE, acute on chronic pancreatitis, cholecystitis, appendicitis, AAA, epigastric rupture.  No additional historians.  External records include prior admission at [facility] and ED visit to the [facility] earlier this week.  Patient is hemodynamically stable, afebrile with epigastric tenderness on exam. Patient has had extensive workup in the past week.  CTA  negative for PE,  Chest x-ray without cardiopulmonary disease, and lab workup without significant abnormality.  He is PERC negative.  Patient given GI cocktail for symptomatic relief.  He was offered Tylenol but he  declined.  Review of PDMP displays the patient has received a notable amount of opiates over the past week.  Will hold off on giving opiates today.  Upon reevaluation patient's symptoms persist.  Today's lab work has been unremarkable.  When discussing these findings with the patient, I informed him that I am unable to give him any opiates today, particularly in light of how much he has received over the past week.  I informed him I can give him something else for pain.  Less than 5 minutes later the nurse informed me that the patient would like to leave.  He was asked to sign a AMA form, of which he refused this and paperwork.  Critical interventions or consultations indicated.  Social determinants of health including substance abuse disorder.           Final Clinical Impression(s) / ED Diagnoses Final diagnoses:  Atypical chest pain  Epigastric pain    Rx / DC Orders ED Discharge Orders     None         Fabienne Bruns 03/17/23 1339    Eber Hong, MD 03/17/23 [phone]

## 2023-03-17 NOTE — ED Notes (Signed)
Pt noted ambulating out of room with belongings. IV discontinued from right hand, dressing applied. Maris Berger PA-C aware.

## 2023-03-17 NOTE — ED Notes (Signed)
Patient came up to charge nurse and states he is leaving and going to another hospital  refused to sign AMA

## 2023-03-17 NOTE — ED Triage Notes (Signed)
Pt present to ED with c/o chest pain, onset 30 minutes ago. Pt A&Ox3 at this time.

## 2023-03-19 ENCOUNTER — Encounter: Payer: Self-pay | Admitting: Gastroenterology

## 2023-03-19 NOTE — Progress Notes (Signed)
[facility] pancreatic fluid analysis Cytology Nondiagnostic = the specimen is devoid of epithelial cells and macrophages. Epithelial cell atypia = acellular Cellularity = acellular Cell composition = acellular Mucin = scant Proteinaceous debris = present Inflammation = absent  CEA 235 ng/mL Amylase 10,883 units/L Glucose less than 4.3 mg/dL  Additional PancraGEN testing for GNAS/KRAS evaluation is pending at this time.  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 This is consistent (other than glucose not being done years ago) with the previous EUS report.  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 With this being said, we can wait for the final GNAS/KRAS testing, but if we were to believe that this patient or having.  Episodes of pancreatitis while being completely free of all alcohol consumption for months at a time, then the role of surgical evaluation could be considered, but it is not clear to me that he has had alcohol cessation  Patty, Please let patient know that fluid analysis is still pending, but concerning for potential premalignant cyst.  We will update him about the final testing when it does return but may consider a surgical consultation. He must remain free of all alcohol as best he can.  I will forward this to his primary GI team as well.

## 2023-03-20 NOTE — Progress Notes (Signed)
Attempted to reach pt x2 and the line rings then goes fast busy will attempt later

## 2023-03-20 NOTE — Progress Notes (Signed)
Thanks Gabe, appreciate the follow up. He was scheduled to see me in the office after your EUS and no-showed that appointment.

## 2023-03-21 NOTE — Progress Notes (Signed)
Attempted to reach pt again and line rings then goes fast busy.  Will mail letter to the pt to return call.
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
## 2023-03-29 ENCOUNTER — Ambulatory Visit: Payer: MEDICAID | Admitting: Student
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
## 2023-04-07 ENCOUNTER — Telehealth: Payer: Self-pay | Admitting: Internal Medicine

## 2023-04-07 NOTE — Telephone Encounter (Signed)
IMTS after hours page received.   He called asking for for his appointment that he missed to be rescheduled.  He has not establish care with our clinic yet.  He was admitted in early November and again in late November due to epigastric pain.  He needs to follow-up and establish care with a PCP.  On chart review I see that he has an appointment with Gwinda Passe with [facility] on December 3 at 8:30 AM.  Patient was not aware of this appointment.  He is open to establishing care with that clinic just wants to establish care soon as he is having a lot of pain. P: Patient plans to keep appointment with Gwinda Passe for December 3 at 8:30 AM

## 2023-04-09 ENCOUNTER — Encounter (HOSPITAL_COMMUNITY): Payer: Self-pay | Admitting: Emergency Medicine

## 2023-04-09 ENCOUNTER — Other Ambulatory Visit: Payer: Self-pay

## 2023-04-09 ENCOUNTER — Emergency Department (HOSPITAL_COMMUNITY)
Admission: EM | Admit: 2023-04-09 | Discharge: 2023-04-09 | Disposition: A | Discharge status: Home / Self Care | Payer: MEDICAID | Attending: Emergency Medicine | Admitting: Emergency Medicine

## 2023-04-09 DIAGNOSIS — K860 Alcohol-induced chronic pancreatitis: Secondary | ICD-10-CM | POA: Diagnosis not present

## 2023-04-09 DIAGNOSIS — E162 Hypoglycemia, unspecified: Secondary | ICD-10-CM | POA: Insufficient documentation

## 2023-04-09 DIAGNOSIS — Z91040 Latex allergy status: Secondary | ICD-10-CM | POA: Diagnosis not present

## 2023-04-09 DIAGNOSIS — I10 Essential (primary) hypertension: Secondary | ICD-10-CM | POA: Diagnosis not present

## 2023-04-09 DIAGNOSIS — R112 Nausea with vomiting, unspecified: Secondary | ICD-10-CM | POA: Diagnosis present

## 2023-04-09 LAB — CBC WITH DIFFERENTIAL/PLATELET
Abs Immature Granulocytes: 0.03 10*3/uL (ref 0.00–0.07)
Basophils Absolute: 0 10*3/uL (ref 0.0–0.1)
Basophils Relative: 0 %
Eosinophils Absolute: 0.2 10*3/uL (ref 0.0–0.5)
Eosinophils Relative: 2 %
HCT: 33.3 % — ABNORMAL LOW (ref 39.0–52.0)
Hemoglobin: 10.3 g/dL — ABNORMAL LOW (ref 13.0–17.0)
Immature Granulocytes: 0 %
Lymphocytes Relative: 27 %
Lymphs Abs: 2.4 10*3/uL (ref 0.7–4.0)
MCH: 24.8 pg — ABNORMAL LOW (ref 26.0–34.0)
MCHC: 30.9 g/dL (ref 30.0–36.0)
MCV: 80 fL (ref 80.0–100.0)
Monocytes Absolute: 0.7 10*3/uL (ref 0.1–1.0)
Monocytes Relative: 7 %
Neutro Abs: 5.8 10*3/uL (ref 1.7–7.7)
Neutrophils Relative %: 64 %
Platelets: 354 10*3/uL (ref 150–400)
RBC: 4.16 MIL/uL — ABNORMAL LOW (ref 4.22–5.81)
RDW: 21.2 % — ABNORMAL HIGH (ref 11.5–15.5)
Smear Review: NORMAL
WBC: 9.2 10*3/uL (ref 4.0–10.5)
nRBC: 0 % (ref 0.0–0.2)

## 2023-04-09 LAB — COMPREHENSIVE METABOLIC PANEL
ALT: 26 U/L (ref 0–44)
AST: 45 U/L — ABNORMAL HIGH (ref 15–41)
Albumin: 4 g/dL (ref 3.5–5.0)
Alkaline Phosphatase: 92 U/L (ref 38–126)
Anion gap: 12 (ref 5–15)
BUN: 14 mg/dL (ref 6–20)
CO2: 15 mmol/L — ABNORMAL LOW (ref 22–32)
Calcium: 8.8 mg/dL — ABNORMAL LOW (ref 8.9–10.3)
Chloride: 107 mmol/L (ref 98–111)
Creatinine, Ser: 1.02 mg/dL (ref 0.61–1.24)
GFR, Estimated: >60 mL/min (ref >60)
Glucose, Bld: 86 mg/dL (ref 70–99)
Potassium: 4.2 mmol/L (ref 3.5–5.1)
Sodium: 134 mmol/L — ABNORMAL LOW (ref 135–145)
Total Bilirubin: 0.7 mg/dL (ref <1.2)
Total Protein: 7.8 g/dL (ref 6.5–8.1)

## 2023-04-09 LAB — URINALYSIS, ROUTINE W REFLEX MICROSCOPIC
Bacteria, UA: NONE SEEN
Bilirubin Urine: NEGATIVE
Glucose, UA: 150 mg/dL — AB
Hgb urine dipstick: NEGATIVE
Ketones, ur: 5 mg/dL — AB
Leukocytes,Ua: NEGATIVE
Nitrite: NEGATIVE
Protein, ur: NEGATIVE mg/dL
Specific Gravity, Urine: 1.016 (ref 1.005–1.030)
pH: 5 (ref 5.0–8.0)

## 2023-04-09 LAB — CBG MONITORING, ED
Glucose-Capillary: 68 mg/dL — ABNORMAL LOW (ref 70–99)
Glucose-Capillary: 151 mg/dL — ABNORMAL HIGH (ref 70–99)
Glucose-Capillary: 134 mg/dL — ABNORMAL HIGH (ref 70–99)

## 2023-04-09 LAB — RAPID URINE DRUG SCREEN, HOSP PERFORMED
Amphetamines: NOT DETECTED
Barbiturates: NOT DETECTED
Benzodiazepines: NOT DETECTED
Cocaine: NOT DETECTED
Opiates: POSITIVE — AB
Tetrahydrocannabinol: NOT DETECTED

## 2023-04-09 LAB — LIPASE, BLOOD: Lipase: 19 U/L (ref 11–51)

## 2023-04-09 LAB — ETHANOL: Alcohol, Ethyl (B): <10 mg/dL (ref <10)

## 2023-04-09 MED ORDER — MORPHINE SULFATE (PF) 4 MG/ML IV SOLN
4.0000 mg | Freq: Once | INTRAVENOUS | Status: AC
  Administered 2023-04-09: 4 mg via INTRAVENOUS
  Filled 2023-04-09: qty 1

## 2023-04-09 MED ORDER — OXYCODONE HCL 10 MG PO TABS: 10.0000 mg | ORAL_TABLET | Freq: Four times a day (QID) | ORAL | 0 refills | Status: AC | PRN

## 2023-04-09 MED ORDER — ONDANSETRON 4 MG PO TBDP: 4.0000 mg | ORAL_TABLET | Freq: Three times a day (TID) | ORAL | 0 refills | Status: DC | PRN

## 2023-04-09 MED ORDER — PROCHLORPERAZINE EDISYLATE 10 MG/2ML IJ SOLN
10.0000 mg | Freq: Once | INTRAMUSCULAR | Status: AC
  Administered 2023-04-09: 10 mg via INTRAVENOUS
  Filled 2023-04-09: qty 2

## 2023-04-09 MED ORDER — DEXTROSE 50 % IV SOLN
1.0000 | Freq: Once | INTRAVENOUS | Status: AC
  Administered 2023-04-09: 50 mL via INTRAVENOUS
  Filled 2023-04-09: qty 50

## 2023-04-09 NOTE — ED Notes (Signed)
Pt actively vomiting and on bedside commode for diarrhea.. EDP advised

## 2023-04-09 NOTE — Discharge Instructions (Addendum)
Thank you for coming to [facility] Emergency Department. You were seen for exacerbation of your chronic pancreatitis with nausea/vomiting. Your blood sugar was low which was treated with IV medicines as well as nausea/pain medicines.. We did an exam, labs, and these showed no acute findings. You were observed in the ED for several hours and you were able to eat/drink. Your sugar came back to normal. Please use zofran 4 mg under the tongue every 6-8 hours as needed for nausea vomiting.  If you were vomiting so severely that you cannot eat or drink anything please come to the emergency room.  Please stay well-hydrated at home.  We have also prescribed oxycodone 10 mg as needed every 6 hours for severe pain. Please follow up with your primary care provider or gastroenterologist within 1 week.   Do not hesitate to return to the ED or call 911 if you experience: -Worsening symptoms -Severe abdominal pain -Nausea/vomiting so severe you cannot eat/drink anything -Lightheadedness, passing out -Fevers/chills -Anything else that concerns you

## 2023-04-09 NOTE — ED Provider Notes (Signed)
[place] EMERGENCY DEPARTMENT AT [facility] Provider Note   CSN: [id number] Arrival date & time: 04/09/23  1808     History  Chief Complaint  Patient presents with   Nausea    Vomiting    [name] is a 52 y.o. male with PMH as listed below who is BIB [facility] from home due to nausea, vomiting and diarrhea x 2 days.  States he feels like he is having a flare up of his pancreatitis. Has been taking his creon, carafate, and oxycodone but it's not helping and he is having so much vomiting he can't keep them down. Patient states he recently had a nerve block for his pancreas but they had to stop d/t complications during procedure. Signed up to go see pain clinic. CBG 57 en route, 15 grams of glucose PO given and 4mg  of zofran given in left glute. Has been drinking EtOH recently, 3 beers last night. Denies fevers/chills, urinary symptoms.  Per chart review, patient presented to ED on 03/11/23 and 03/17/23 with chest pain and abdominal pain. Had CT PE which showed no findings. Was admitted from 04/05/23-04/06/23 at [facility] with chronic pancreatitis flare, counseled against ongoing alcohol abuse, and recommended follow up with [place] gastro[facility]. During his admission had CT abd pelvis performed on 04/05/23 which read:  No acute findings.  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 No radiographic signs of acute  pancreatitis.     Past Medical History:  Diagnosis Date   Alcoholism /alcohol abuse    Anemia    Anxiety    Arthritis    knees; arms; elbows (03/26/2015)   Asthma    Bipolar disorder (HCC)    Chronic bronchitis (HCC)    Chronic lower back pain    Chronic pancreatitis (HCC)    Cocaine abuse (HCC)    Depression    Family history of adverse reaction to anesthesia    Femoral condyle fracture (HCC) 03/08/2014   left medial/notes 03/09/2014   GERD (gastroesophageal reflux disease)    H/O  hiatal hernia    H/O suicide attempt 10/2012   High cholesterol    History of blood transfusion 10/2012   when I tried to commit suicide   History of stomach ulcers    Hypertension    Marijuana abuse, continuous    Migraine    a few times/year (03/26/2015)   Pancreatitis    Pneumonia 1990's X 3   PTSD (post-traumatic stress disorder)    Seizures (HCC)    Sickle cell trait (HCC)    WPW (Wolff-Parkinson-White syndrome)    [name] 03/06/2013       Home Medications Prior to Admission medications   Medication Sig Start Date End Date Taking? Authorizing Provider  ondansetron (ZOFRAN-ODT) 4 MG disintegrating tablet Take 1 tablet (4 mg total) by mouth every 8 (eight) hours as needed. 04/09/23  Yes Loetta Rough, MD  famotidine (PEPCID) 20 MG tablet Take 1 tablet (20 mg total) by mouth 2 (two) times daily. 03/07/23   Rodolph Bong, MD  fluticasone (FLONASE) 50 MCG/ACT nasal spray Place 2 sprays into both nostrils 2 (two) times daily as needed for allergies.    [provider]  folic acid (FOLVITE) 1 MG tablet Take 1 tablet (1 mg total) by mouth daily. 09/21/22   Pokhrel, Rebekah Chesterfield, MD  hydrocortisone cream 1 % Apply topically as needed for itching. 03/07/23   Rodolph Bong, MD  lipase/protease/amylase (  CREON) 36000 UNITS CPEP capsule Take 2 capsules (72,000 Units total) by mouth 3 (three) times daily with meals. May also take 1 capsule (36,000 Units total) as needed (with snacks). 01/26/23   Jerald Kief, MD  liver oil-zinc oxide (DESITIN) 40 % ointment Apply topically 2 (two) times daily. Apply to buttocks Patient not taking: Reported on 03/11/2023 03/07/23   Rodolph Bong, MD  loperamide (IMODIUM) 2 MG capsule Take 1 capsule (2 mg total) by mouth as needed for diarrhea or loose stools. 03/07/23   Rodolph Bong, MD  metoprolol (TOPROL-XL) 200 MG 24 hr tablet Take 1 tablet (200 mg total) by mouth daily. 03/07/23   Rodolph Bong, MD  nitroGLYCERIN (NITROSTAT)  0.4 MG SL tablet Place 0.4 mg under the tongue every 5 (five) minutes as needed for chest pain. Patient not taking: Reported on 03/11/2023    [provider]  pantoprazole (PROTONIX) 40 MG tablet Take 1 tablet (40 mg total) by mouth 2 (two) times daily before a meal. 03/07/23   Rodolph Bong, MD  pregabalin (LYRICA) 100 MG capsule Take 100 mg by mouth 2 (two) times daily. 02/26/23   [provider]  Simethicone 40 MG/0.6ML LIQD Take 0.6 mLs (40 mg total) by mouth in the morning, at noon, and at bedtime for 7 days. Patient taking differently: Take 40 mg by mouth 3 (three) times daily as needed (for bloating and gas). 02/11/23 02/21/24  Dahal, Melina Schools, MD  sucralfate (CARAFATE) 1 GM/10ML suspension Take 10 mLs (1 g total) by mouth See admin instructions. Take 4 times with meals and at bedtime. Patient taking differently: Take 1 g by mouth See admin instructions. Take 1g by mouth three times a day with meals.  May take an additional 1g with a snack. 01/11/23   Jerald Kief, MD  tamsulosin (FLOMAX) 0.4 MG CAPS capsule Take 1 capsule (0.4 mg total) by mouth daily. 03/08/23   Rodolph Bong, MD  amitriptyline (ELAVIL) 25 MG tablet Take 1 tablet (25 mg total) by mouth at bedtime. Patient not taking: Reported on 08/08/2019 10/15/18 08/08/19  Rolly Salter, MD      Allergies    Robaxin [methocarbamol], Aspirin, Shellfish-derived products, Trazodone and nefazodone, Adhesive [tape], Fish-derived products, Latex, Toradol [ketorolac tromethamine], Contrast media [iodinated contrast media], Reglan [metoclopramide], and Salmon [fish oil]    Review of Systems   Review of Systems A 10 point review of systems was performed and is negative unless otherwise reported in HPI.  Physical Exam Updated Vital Signs BP (!) 133/95   Pulse 60   Temp 98 F (36.7 C)   Resp 15   Ht 5\' 8"  (1.727 m)   Wt 55.8 kg   SpO2 100%   BMI 18.70 kg/m  Physical Exam General: Normal appearing male, lying in  bed.  HEENT: PERRLA, Sclera anicteric, dry mucous membranes, trachea midline.  Cardiology: RRR, no murmurs/rubs/gallops. Resp: Normal respiratory rate and effort. CTAB, no wheezes, rhonchi, crackles.  Abd: Soft, epigastric TTP, non-distended. No rebound tenderness or guarding.  GU: Deferred. MSK: No peripheral edema or signs of trauma.  Skin: warm, dry.  Back: No CVA tenderness Neuro: A&Ox4, CNs II-XII grossly intact. MAEs. Sensation grossly intact.  Psych: Normal mood and affect.   ED Results / Procedures / Treatments   Labs (all labs ordered are listed, but only abnormal results are displayed) Labs Reviewed  CBC WITH DIFFERENTIAL/PLATELET - Abnormal; Notable for the following components:      Result Value  RBC 4.16 (*)    Hemoglobin 10.3 (*)    HCT 33.3 (*)    MCH 24.8 (*)    RDW 21.2 (*)    All other components within normal limits  COMPREHENSIVE METABOLIC PANEL - Abnormal; Notable for the following components:   Sodium 134 (*)    CO2 15 (*)    Calcium 8.8 (*)    AST 45 (*)    All other components within normal limits  URINALYSIS, ROUTINE W REFLEX MICROSCOPIC - Abnormal; Notable for the following components:   Color, Urine STRAW (*)    Glucose, UA 150 (*)    Ketones, ur 5 (*)    All other components within normal limits  RAPID URINE DRUG SCREEN, HOSP PERFORMED - Abnormal; Notable for the following components:   Opiates POSITIVE (*)    All other components within normal limits  CBG MONITORING, ED - Abnormal; Notable for the following components:   Glucose-Capillary 68 (*)    All other components within normal limits  CBG MONITORING, ED - Abnormal; Notable for the following components:   Glucose-Capillary 151 (*)    All other components within normal limits  CBG MONITORING, ED - Abnormal; Notable for the following components:   Glucose-Capillary 134 (*)    All other components within normal limits  ETHANOL  LIPASE, BLOOD    EKG EKG  Interpretation Date/Time:  Sunday April 09 2023 18:12:57 EST Ventricular Rate:  86 PR Interval:  104 QRS Duration:  84 QT Interval:  350 QTC Calculation: 419 R Axis:   74  Text Interpretation: Sinus rhythm  Short PR interval, WPW  Biatrial enlargement  Nonspecific T abnrm, anterolateral leads    Confirmed by Vivi Barrack [phone]) on 04/09/2023 9:50:21 PM  Radiology No results found.  Procedures Procedures    Medications Ordered in ED Medications  dextrose 50 % solution 50 mL (50 mLs Intravenous Given 04/09/23 1848)  prochlorperazine (COMPAZINE) injection 10 mg (10 mg Intravenous Given 04/09/23 1855)  morphine (PF) 4 MG/ML injection 4 mg (4 mg Intravenous Given 04/09/23 1921)  morphine (PF) 4 MG/ML injection 4 mg (4 mg Intravenous Given 04/09/23 2314)    ED Course/ Medical Decision Making/ A&P                          Medical Decision Making Amount and/or Complexity of Data Reviewed Labs: ordered. Decision-making details documented in ED Course. Radiology: ordered.  Risk Prescription drug management.    This patient presents to the ED for concern of N/V epigastric pain, this involves an extensive number of treatment options, and is a complaint that carries with it a high risk of complications and morbidity.  I considered the following differential and admission for this acute, potentially life threatening condition.   MDM:    For DDX for abdominal pain includes but is not limited to:  Abdominal exam without peritoneal signs. No evidence of acute abdomen at this time. Patient states this feels like his prior episodes of acute on chronic pancreatitis. Also states that his lipase is typically negative. On chart review he was recently admitted for the same with negative abdominal imaging, though does have known pancreatic pseudocyst. Lower suspicion for acute hepatobiliary disease (including acute cholecystitis or cholangitis), PUD (including gastric perforation), acute  appendicitis, vascular catastrophe, bowel obstruction, viscus perforation, or diverticulitis. Patient also has abdominal imaging very recently on 04/05/23 which was negative for acute processes w/ the same symptoms. EKG doesn't demonstrate signs  of ACS or arrhythmia and he does not complain of CP or SOB. Consider electrolyte derangements.   On arrival patient is found to be hypoglycemia and is actively vomiting so is given D50 and antiemetics, which improves his blood sugar. He does endorse drinking alcohol and has been vomiting w/ poor PO intake which is likely the cause of his hypoglycemia.   Clinical Course as of 04/16/23 1112  Sun Apr 09, 2023  1830 Glucose-Capillary(!): 68 Will give D50 if patient cannot eat [HN]  1918 Glucose: 86 Improved [HN]  2119 Opiates(!): POSITIVE Has rx oxy at home [HN]  2119 Glucose-Capillary(!): 151 [HN]  2119 Urinalysis, Routine w reflex microscopic -Urine, Clean Catch(!) Mild ketonuria, mild glucosuria, otherwise unremarkable [HN]  2149 Glucose-Capillary(!): 134 [HN]  2149 Lipase: 19 neg [HN]    Clinical Course User Index [HN] Loetta Rough, MD     He is informed about his continued alcohol use causing his pancreatitis as well as hypoglycemia and he reports understanding. On several rechecks q1h his blood sugar remains stable and patient is no longer vomiting.   Labs: I Ordered, and personally interpreted labs.  The pertinent results include:  those listed above  Additional history obtained from chart review.  External records from outside source obtained and reviewed including [place]  Cardiac Monitoring: The patient was maintained on a cardiac monitor.  I personally viewed and interpreted the cardiac monitored which showed an underlying rhythm of: normal sinus rhythm with frequent PACs  Reevaluation: After the interventions noted above, I reevaluated the patient and found that they have :improved  Social Determinants of Health: Lives  independently  Disposition:  Patient is PO challenged successfully. Advised to cease alcohol and to stay well hydrated and eat well at home. He is given several days of oxycodone and zofran for his chronic pancreatitis pain on DC. If sxs worsen  or is unable to eat/drink patient advised to return to ED. He is given specific DC instructions/return precautions. All questions answered to his satisfaction.   Co morbidities that complicate the patient evaluation  Past Medical History:  Diagnosis Date   Alcoholism /alcohol abuse    Anemia    Anxiety    Arthritis    knees; arms; elbows (03/26/2015)   Asthma    Bipolar disorder (HCC)    Chronic bronchitis (HCC)    Chronic lower back pain    Chronic pancreatitis (HCC)    Cocaine abuse (HCC)    Depression    Family history of adverse reaction to anesthesia    Femoral condyle fracture (HCC) 03/08/2014   left medial/notes 03/09/2014   GERD (gastroesophageal reflux disease)    H/O hiatal hernia    H/O suicide attempt 10/2012   High cholesterol    History of blood transfusion 10/2012   when I tried to commit suicide   History of stomach ulcers    Hypertension    Marijuana abuse, continuous    Migraine    a few times/year (03/26/2015)   Pancreatitis    Pneumonia 1990's X 3   PTSD (post-traumatic stress disorder)    Seizures (HCC)    Sickle cell trait (HCC)    WPW (Wolff-Parkinson-White syndrome)    [name] 03/06/2013     Medicines Meds ordered this encounter  Medications   dextrose 50 % solution 50 mL   prochlorperazine (COMPAZINE) injection 10 mg   morphine (PF) 4 MG/ML injection 4 mg   morphine (PF) 4 MG/ML injection 4 mg   ondansetron (ZOFRAN-ODT)  4 MG disintegrating tablet    Sig: Take 1 tablet (4 mg total) by mouth every 8 (eight) hours as needed.    Dispense:  20 tablet    Refill:  0   oxyCODONE 10 MG TABS    Sig: Take 1 tablet (10 mg total) by mouth every 6 (six) hours as needed for up to 3 days for severe pain (pain  score 7-10).    Dispense:  12 tablet    Refill:  0    I have reviewed the patients home medicines and have made adjustments as needed  Problem List / ED Course: Problem List Items Addressed This Visit       Digestive   Chronic pancreatitis (HCC) - Primary   Other Visit Diagnoses     Hypoglycemia       Nausea and vomiting, unspecified vomiting type                       This note was created using dictation software, which may contain spelling or grammatical errors.    Loetta Rough, MD 04/16/23 [phone]

## 2023-04-09 NOTE — ED Triage Notes (Addendum)
Pt BIB [facility] from home due to nausea, vomiting and diarrhea x2days.  Pt ran out of oxycodone that he takes for his pancreatitis.  CBG 57.  15 grams of glucose PO given en route and 4mg  of zofran given in left glute. [facility] VS 150/100 HR 80, SpO2 97%RA

## 2023-04-10 ENCOUNTER — Telehealth (INDEPENDENT_AMBULATORY_CARE_PROVIDER_SITE_OTHER): Payer: Self-pay | Admitting: Primary Care

## 2023-04-10 NOTE — Telephone Encounter (Signed)
Called pt to remind them about apt. Phone number was unavailable at this time.

## 2023-04-11 ENCOUNTER — Inpatient Hospital Stay (INDEPENDENT_AMBULATORY_CARE_PROVIDER_SITE_OTHER): Payer: MEDICAID | Admitting: Primary Care

## 2023-04-15 IMAGING — DX DG CHEST 1V PORT
1 series · 1 of 1 positions shown · non-contrast
Comparison: 10/04/2020

CLINICAL DATA: Left-sided chest pain

EXAM:
PORTABLE CHEST 1 VIEW

[chest ap]
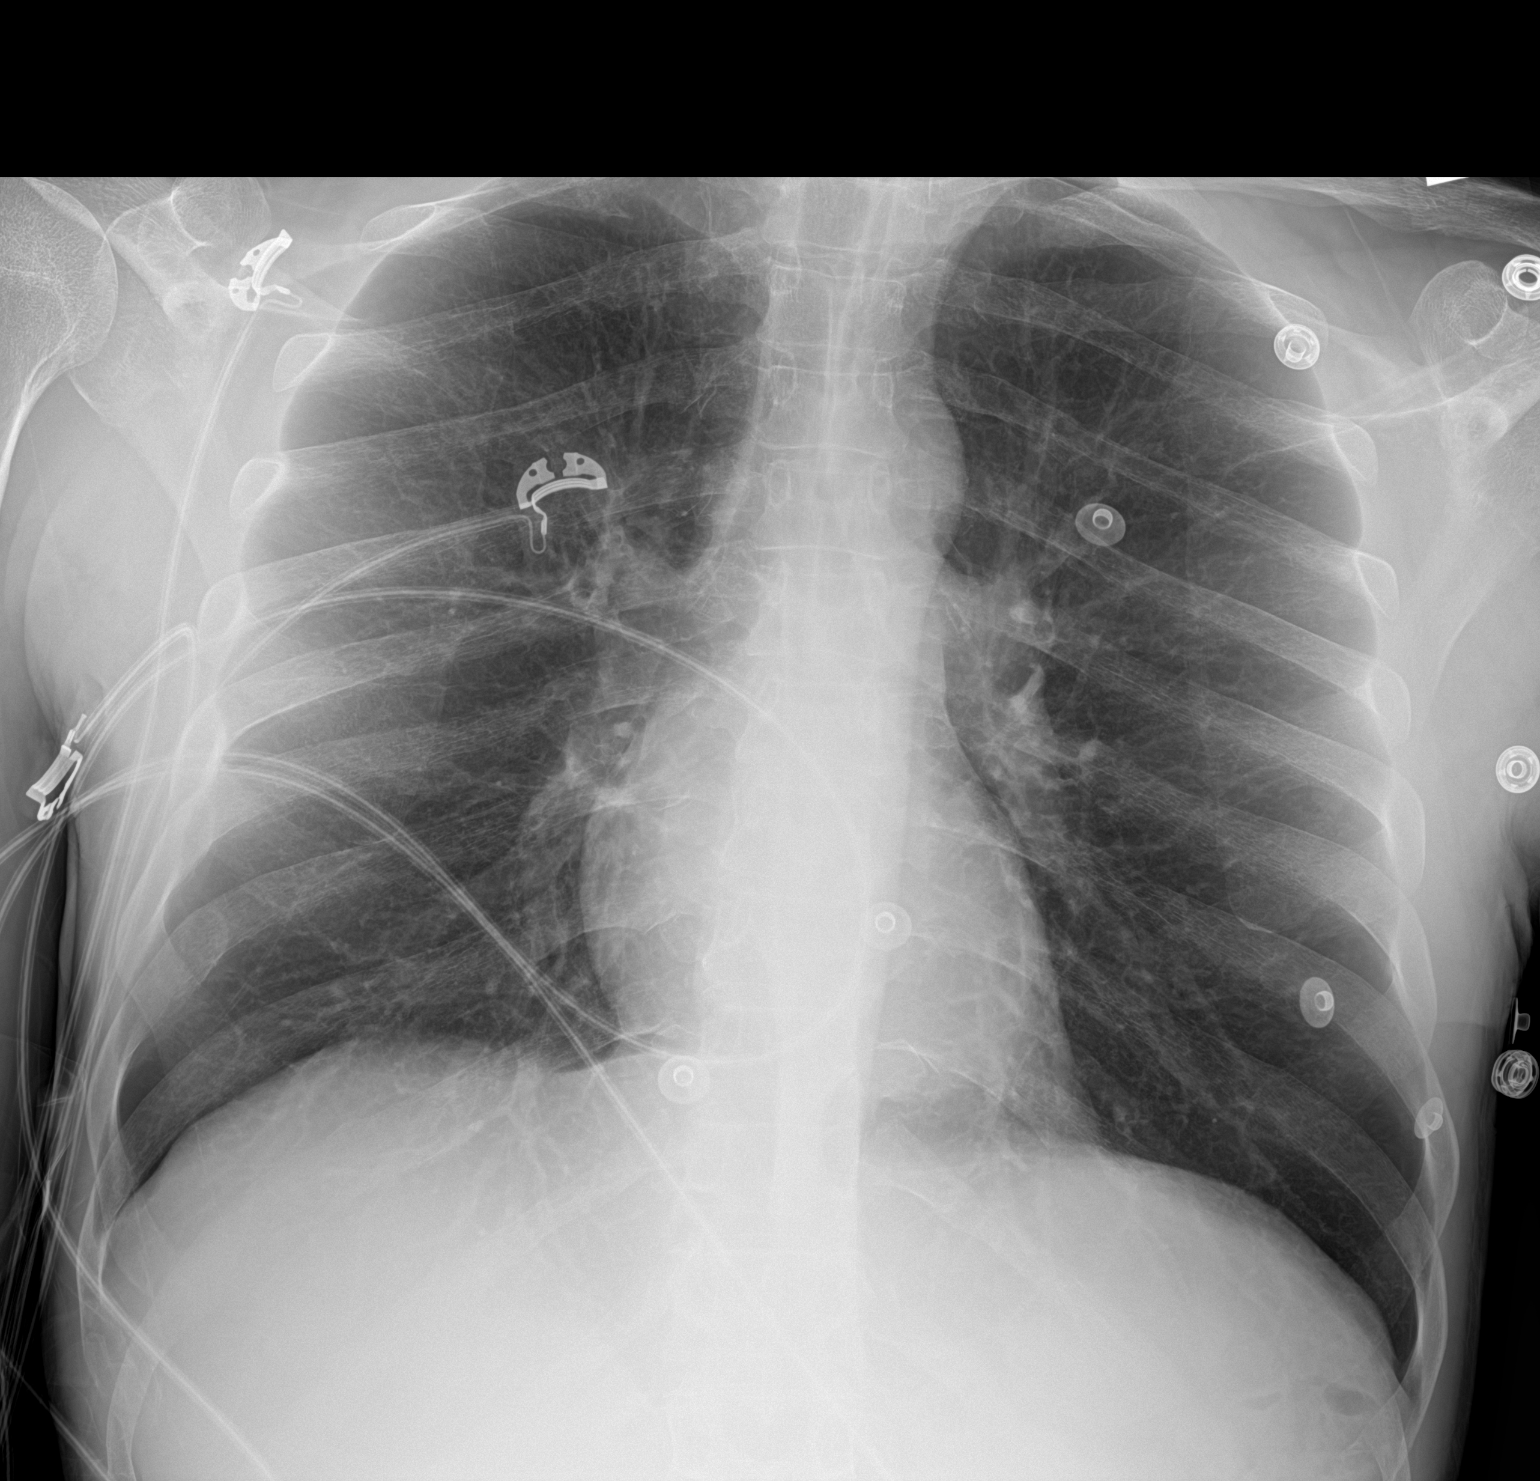

[1 of 1 positions shown; findings below may reference images not displayed]

FINDINGS: The heart size and mediastinal contours are within normal limits.
Mildly hyperinflated lungs. No focal airspace consolidation, pleural
effusion, or pneumothorax. The visualized skeletal structures are
unremarkable.
IMPRESSION: No active disease.

## 2023-04-15 IMAGING — CT CT ABD-PELV W/O CM
2 of 4 series · 16 of 46 positions shown, 18 images · non-contrast
Comparison: 10/23/2020, 08/08/2019, 11/22/2019

CLINICAL DATA: Left-sided chest pain, epigastric pain

EXAM:
CT ABDOMEN AND PELVIS WITHOUT CONTRAST
TECHNIQUE: Multidetector CT imaging of the abdomen and pelvis was performed
following the standard protocol without IV contrast.

[Series 3: a/p w/o 5mm · axial · non-contrast · 0.71mm/px · z∈[+789,+1189]mm · 13 of 88 slices shown, 15 images]
[im 4/88  soft-tissue]
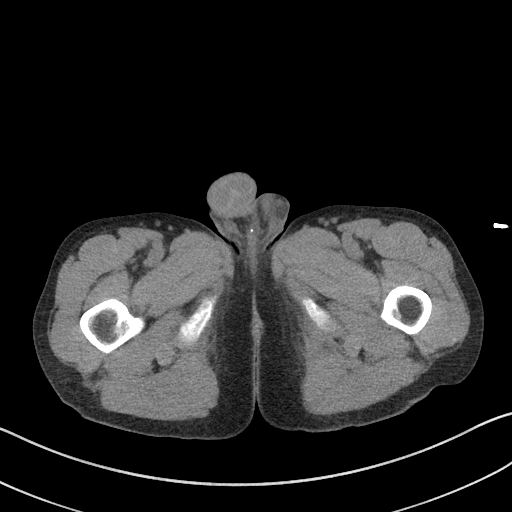
[im 4/88  bone]
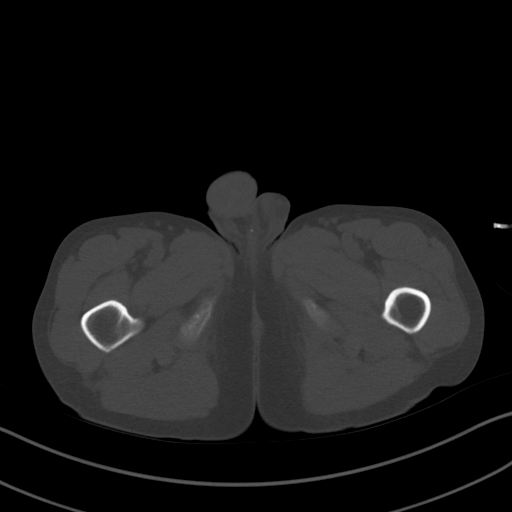
[im 11/88  soft-tissue]
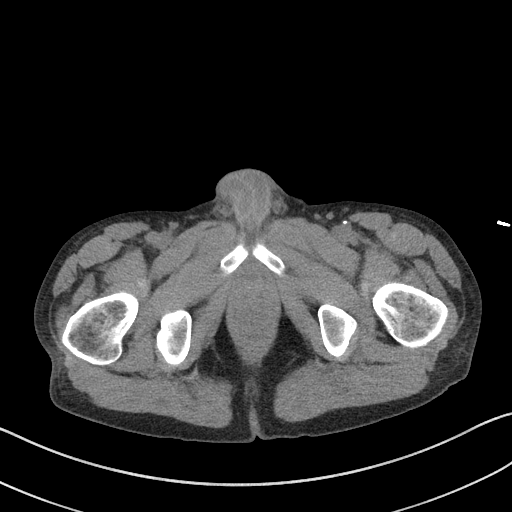
[im 18/88  soft-tissue]
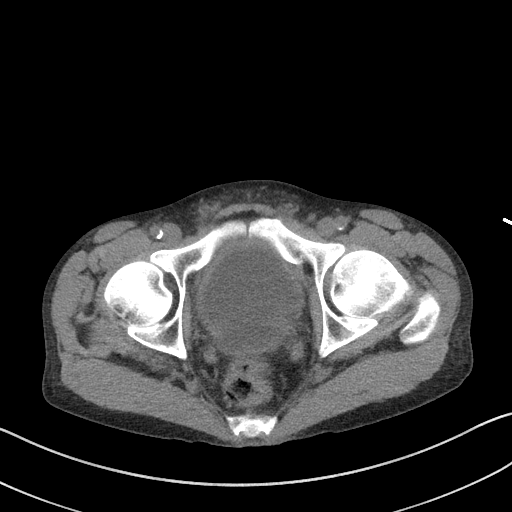
[im 25/88  soft-tissue]
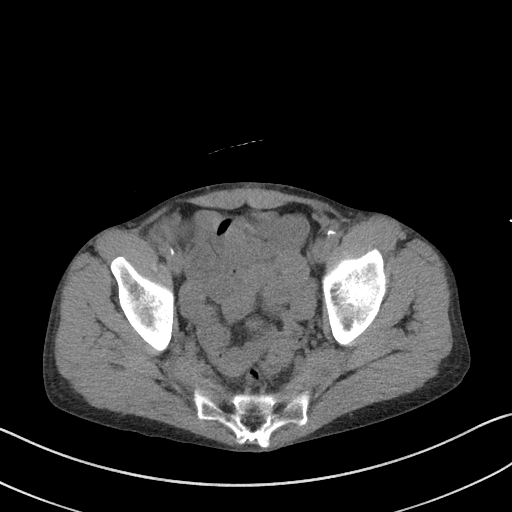
[im 32/88  soft-tissue]
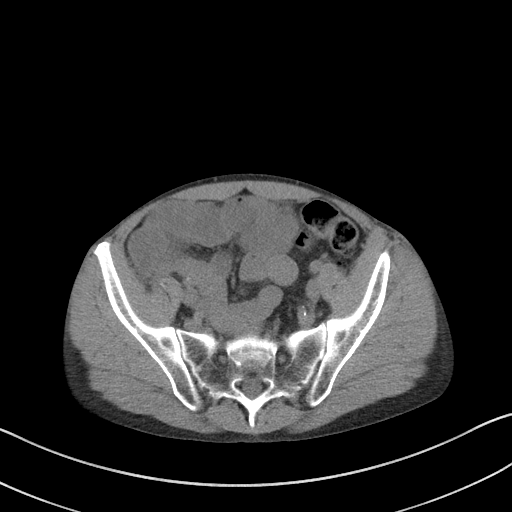
[im 39/88  soft-tissue]
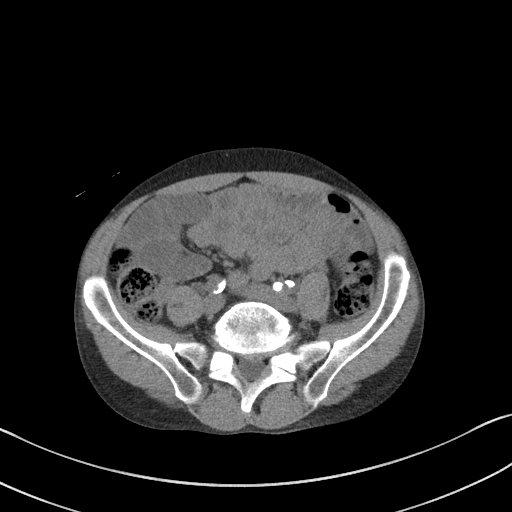
[im 46/88  soft-tissue]
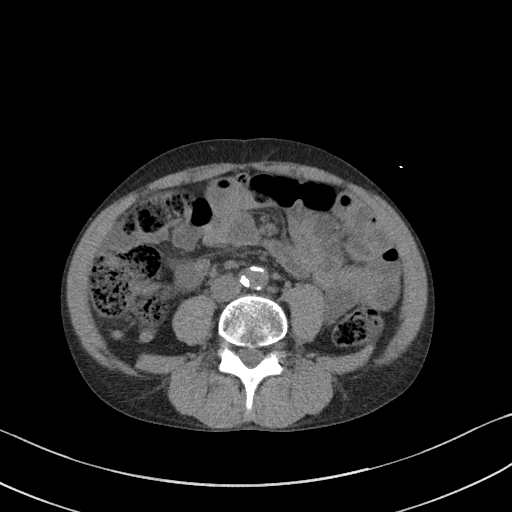
[im 49/88  soft-tissue]
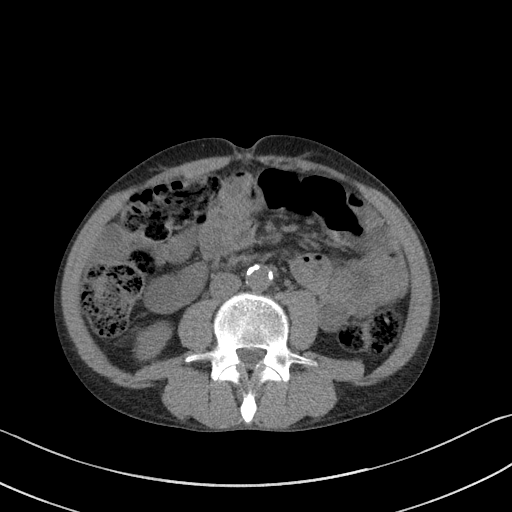
[im 56/88  soft-tissue]
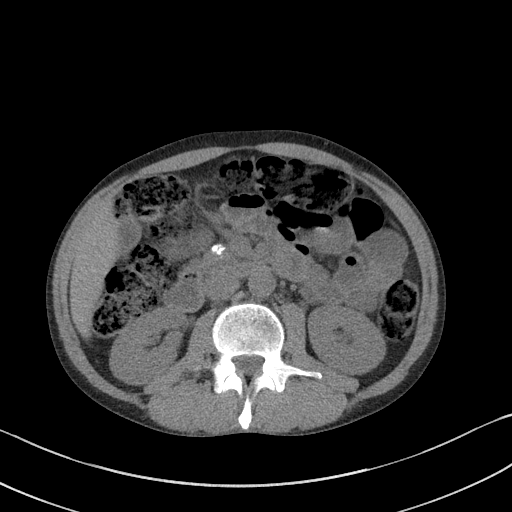
[im 56/88  bone]
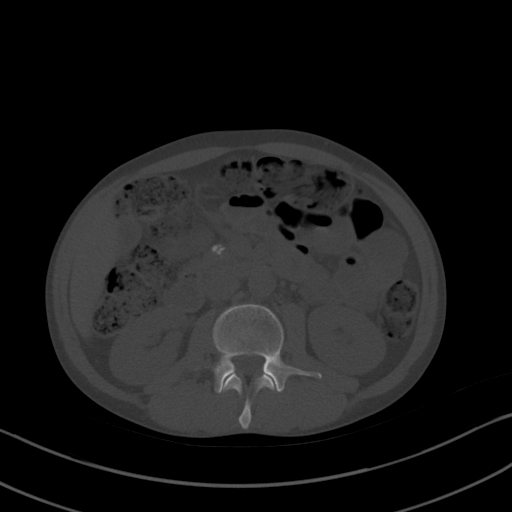
[im 63/88  soft-tissue]
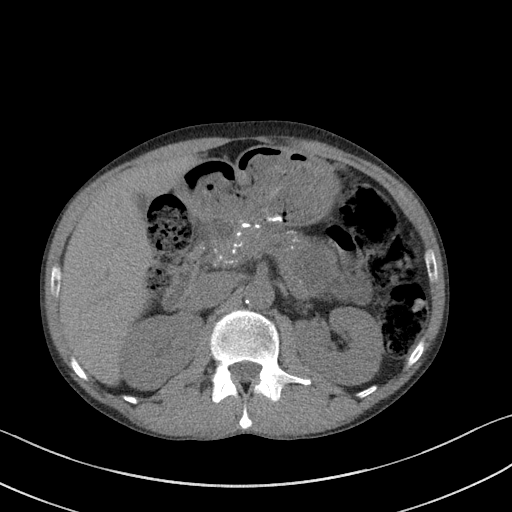
[im 70/88  soft-tissue]
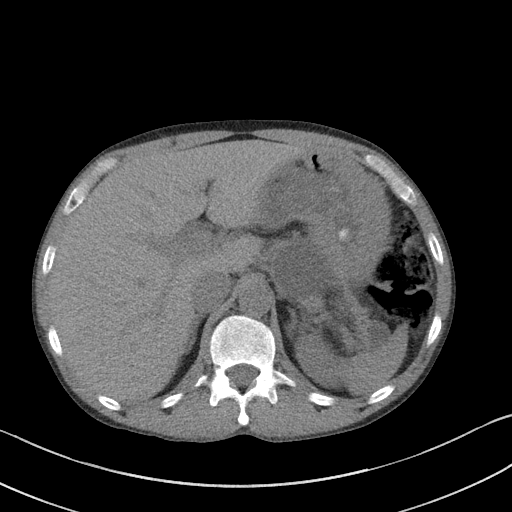
[im 77/88  soft-tissue]
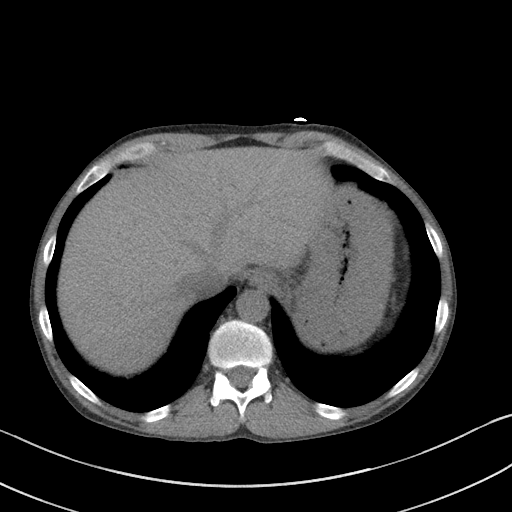
[im 84/88  soft-tissue]
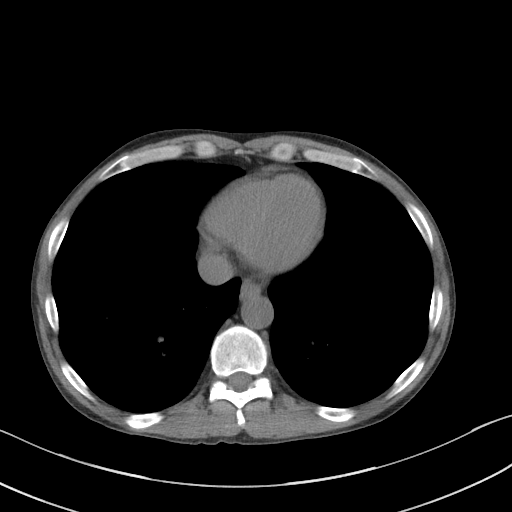

[Series 6: a/p w/o cor · coronal · non-contrast · 0.66mm/px · 3 of 121 slices shown]
[im 41/121  soft-tissue]
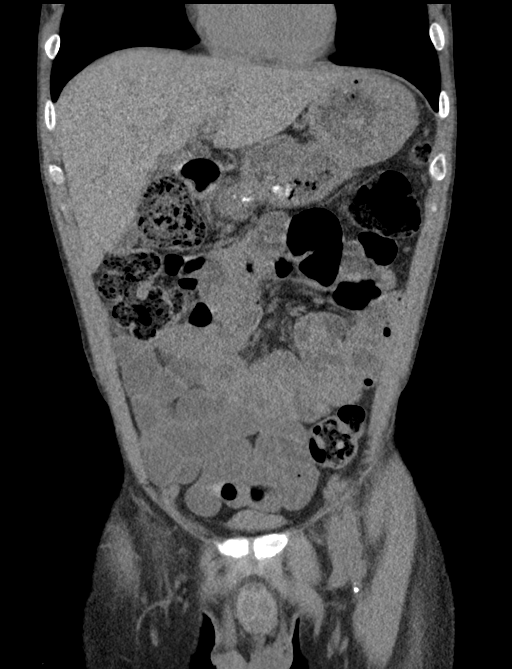
[im 54/121  soft-tissue]
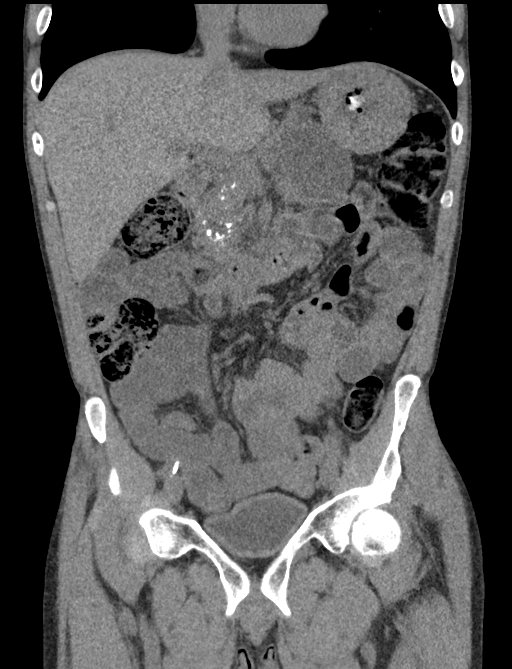
[im 67/121  soft-tissue]
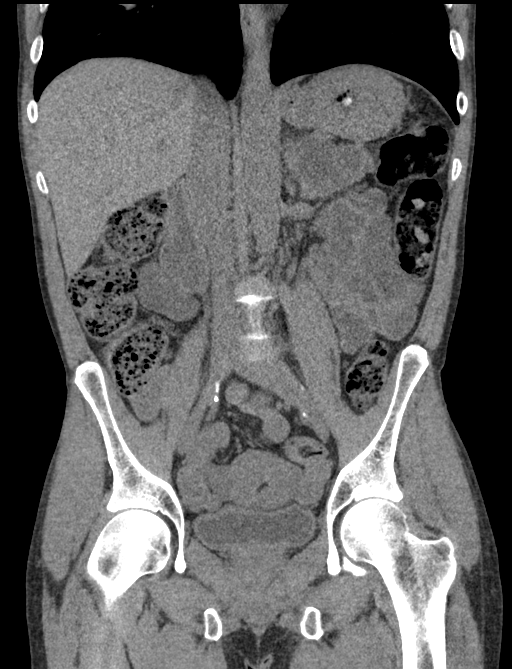

[16 of 46 positions shown; findings below may reference images not displayed]

FINDINGS: Lower chest: No acute pleural or parenchymal lung disease.

Unenhanced CT was performed per clinician order. Lack of IV contrast
limits sensitivity and specificity, especially for evaluation of
abdominal/pelvic solid viscera.

Hepatobiliary: No focal liver abnormality is seen. No gallstones,
gallbladder wall thickening, or biliary dilatation.

Pancreas: Diffuse calcifications are seen throughout the pancreas
consistent with sequela of chronic calcific pancreatitis. No acute
inflammatory changes are seen. Multiple cystic areas are seen
throughout the pancreatic parenchyma, largest at the junction of the
body and tail measuring 4.6 x 4.8 cm, most consistent with
pancreatic pseudocyst. Areas of cystic pancreatic duct dilation are
also noted, unchanged since prior MRI.

Spleen: Normal in size without focal abnormality.

Adrenals/Urinary Tract: No urinary tract calculi or obstructive
uropathy. The adrenals and bladder are unremarkable.

Stomach/Bowel: No bowel obstruction or ileus. There is moderate
retained stool throughout the colon. Normal appendix right lower
quadrant. Chronic gastric wall thickening may reflect gastritis.
Endoscopy recommended if not previously performed.

Vascular/Lymphatic: Aortic atherosclerosis. No enlarged abdominal or
pelvic lymph nodes.

Reproductive: Prostate is unremarkable.

Other: No free fluid or free gas.  No abdominal wall hernia.

Musculoskeletal: No acute or destructive bony lesions. Reconstructed
images demonstrate no additional findings.
IMPRESSION: 1. Sequela from chronic calcific pancreatitis, with continued cystic
dilatation of the pancreatic duct and enlarging pancreatic
pseudocysts as above. No acute inflammatory changes.
2. Chronic gastric wall thickening which may reflect sequela from
gastritis. Endoscopy may be useful if not previously performed.
3.  Aortic Atherosclerosis ([id number]-ALX.X).

## 2023-04-22 IMAGING — CT CT ABD-PELV W/O CM
2 of 7 series · 12 of 46 positions shown, 18 images · non-contrast
Comparison: MRI abdomen 11/22/2019, CT abdomen pelvis 10/23/2020

CLINICAL DATA: Nonlocalized acute abdominal pain.

EXAM:
CT ABDOMEN AND PELVIS WITHOUT CONTRAST
TECHNIQUE: Multidetector CT imaging of the abdomen and pelvis was performed
following the standard protocol without IV contrast.

[Series 3: ap without · axial · non-contrast · 0.57mm/px · z∈[-553,-298]mm · 9 of 65 slices shown, 15 images]
[im 7/65  soft-tissue]
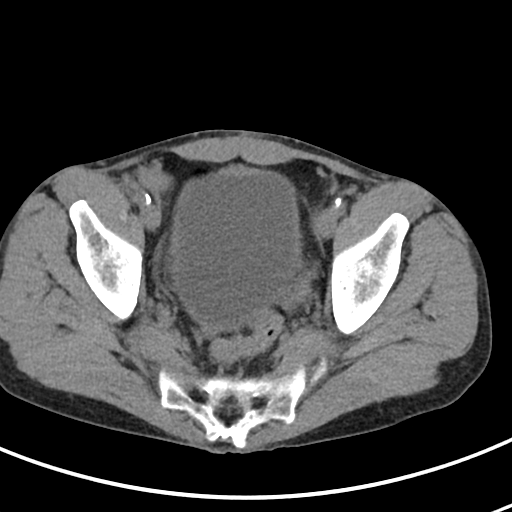
[im 7/65  bone]
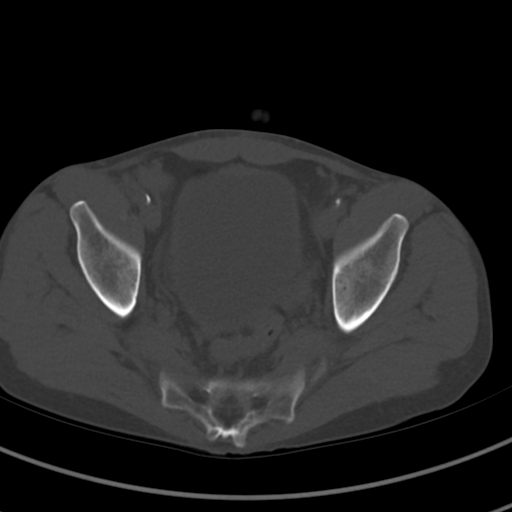
[im 13/65  soft-tissue]
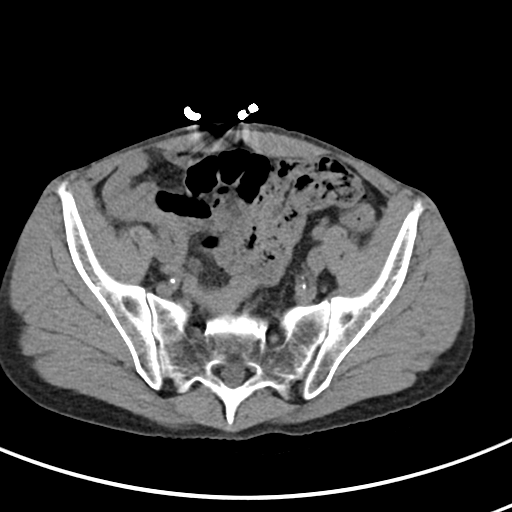
[im 20/65  soft-tissue]
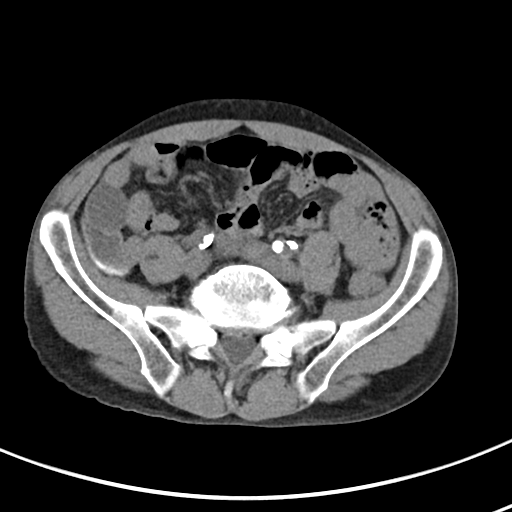
[im 26/65  soft-tissue]
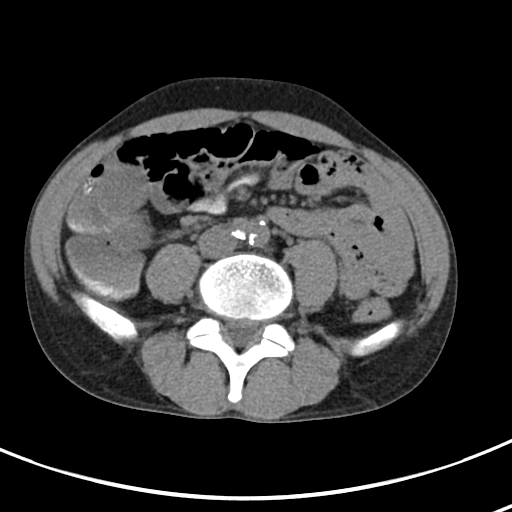
[im 33/65  soft-tissue]
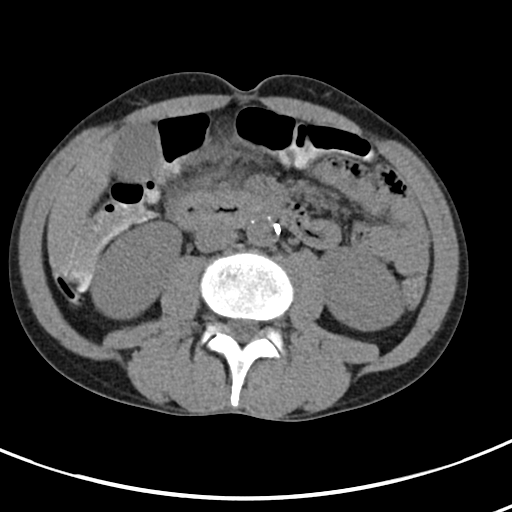
[im 39/65  soft-tissue]
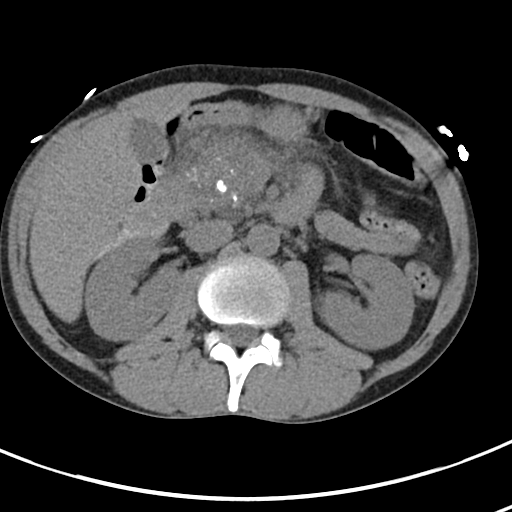
[im 39/65  lung]
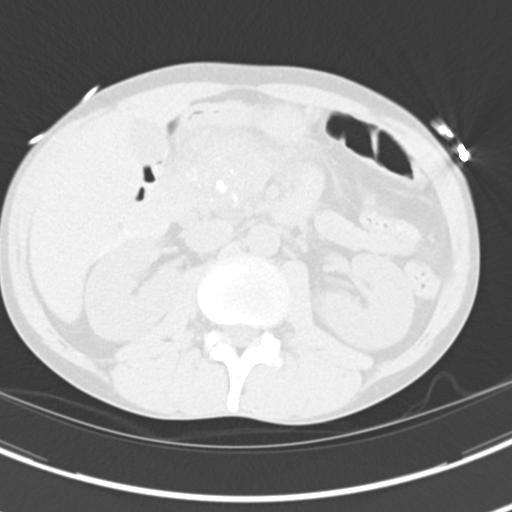
[im 45/65  soft-tissue]
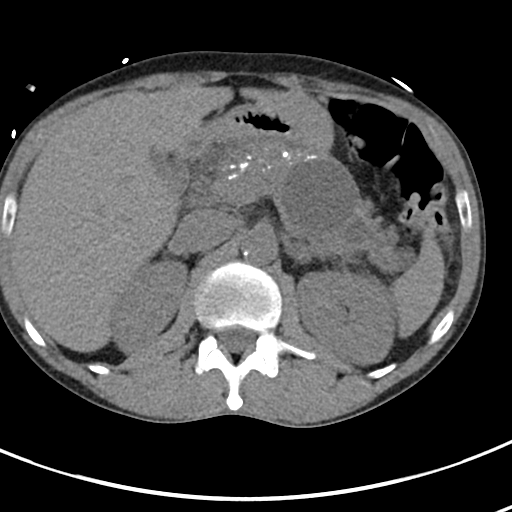
[im 45/65  lung]
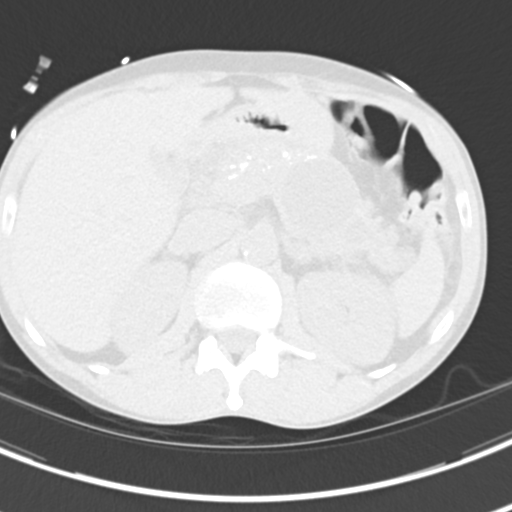
[im 52/65  soft-tissue]
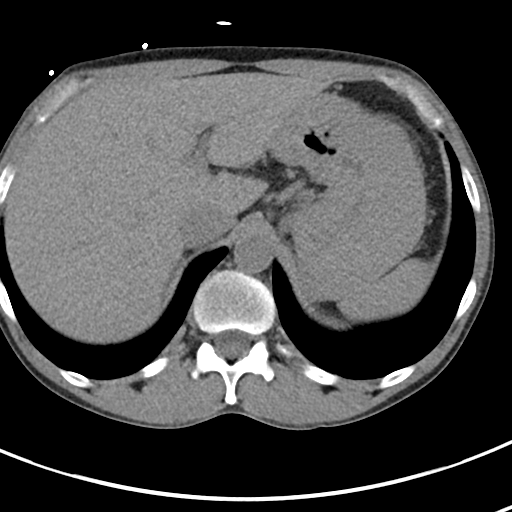
[im 52/65  lung]
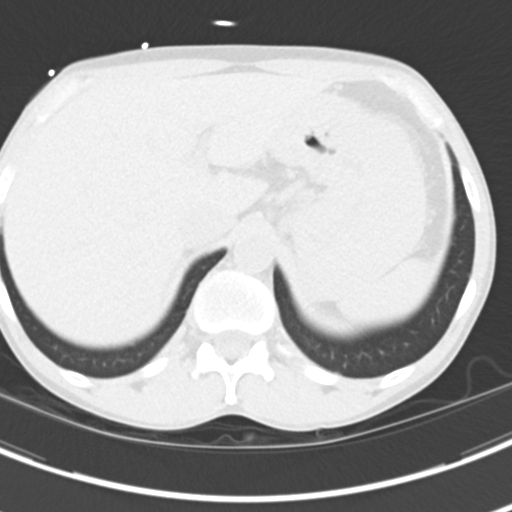
[im 58/65  soft-tissue]
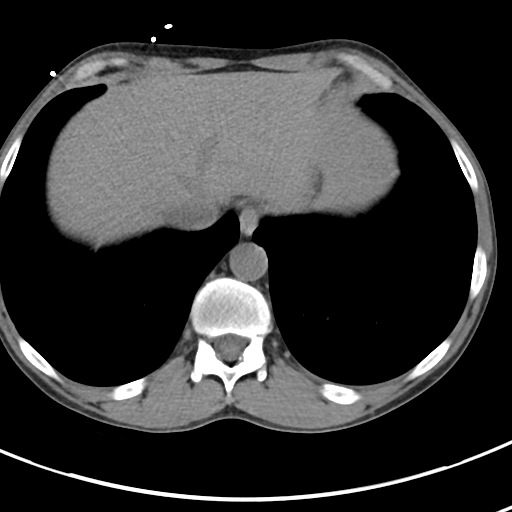
[im 58/65  lung]
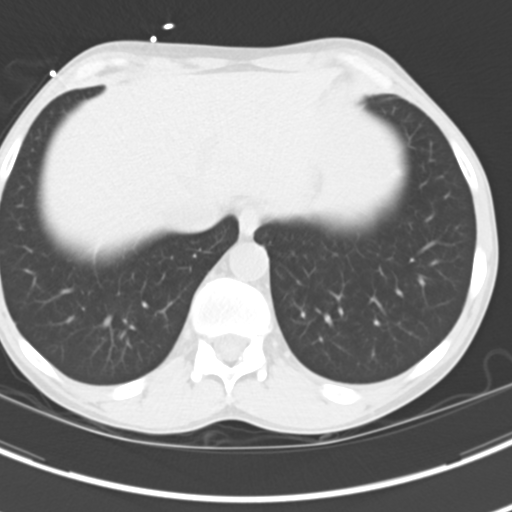
[im 58/65  bone]
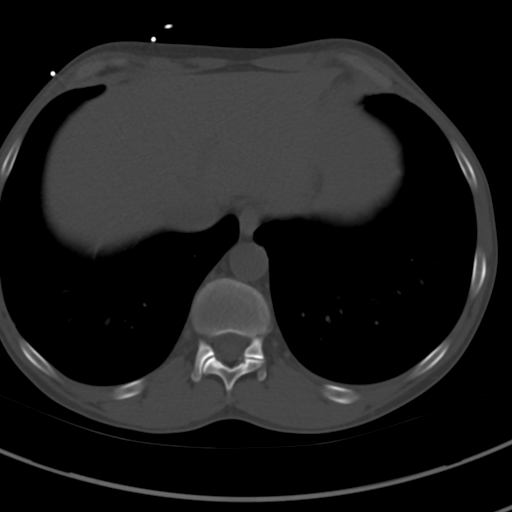

[Series 6: cor · coronal · 0.60mm/px · 3 of 83 slices shown]
[im 21/83  soft-tissue]
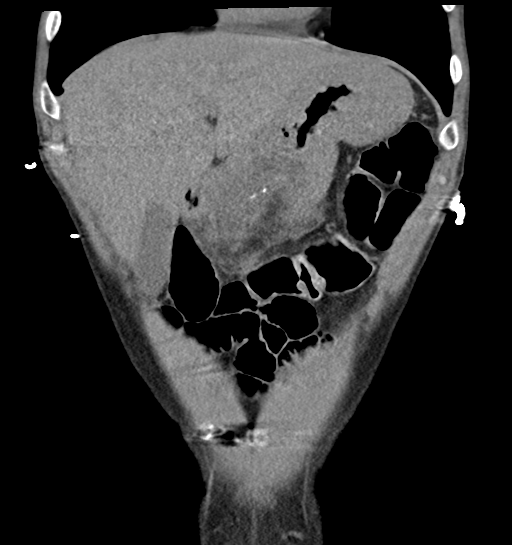
[im 42/83  soft-tissue]
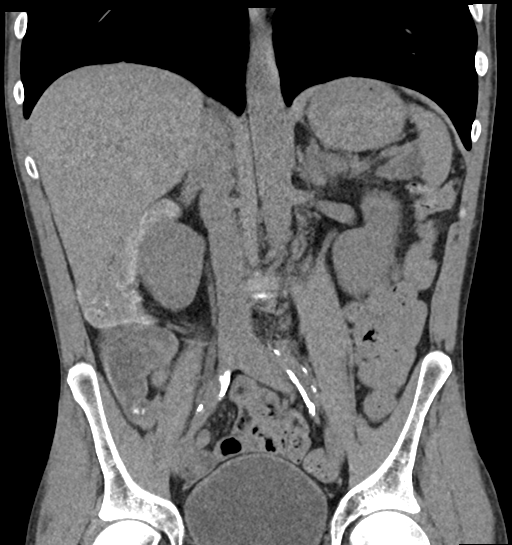
[im 62/83  soft-tissue]
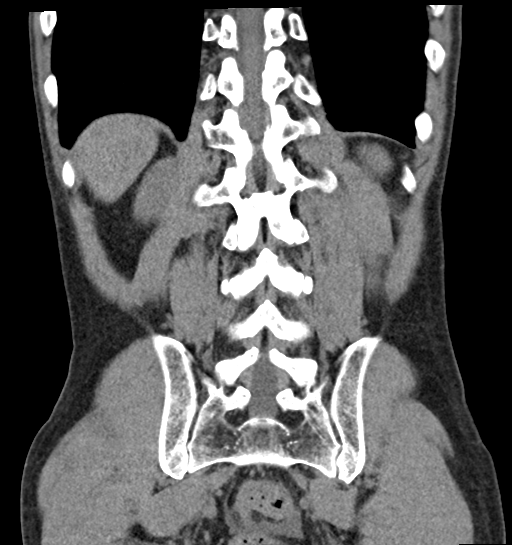

[12 of 46 positions shown; findings below may reference images not displayed]

FINDINGS: Lower chest: Unremarkable.

Hepatobiliary: No focal liver abnormality. No gallstones,
gallbladder wall thickening, or pericholecystic fluid. No biliary
dilatation.

Pancreas: Interval development of proximal pancreatic parenchyma
hazy contour and fat stranding. Similar-appearing pancreatic tail
4.2 x 4.1 cm cystic lesion as well as several smaller lesions
distally consistent with a combination of foci of duct dilatation
intraparenchymal pseudocysts that are better evaluated on prior MRI
in 4248. Lung bronchus eczema pancreas there is redemonstration of
coarse calcifications as well as similar-appearing cystic lesions.
There is a region of both cystic and calcifications within the
pancreatic uncinate process that appears similar compared to
10/23/2020 (3: 26-31).

Spleen: Normal in size without focal abnormality.

Adrenals/Urinary Tract:

No adrenal nodule bilaterally.

No nephrolithiasis, no hydronephrosis, and no contour-deforming
renal mass. No ureterolithiasis or hydroureter.

The urinary bladder is unremarkable.

Stomach/Bowel: Gastric underdistention. Otherwise stomach is within
normal limits. No evidence of bowel wall thickening or dilatation.
Appendix appears normal.

Vascular/Lymphatic: No abdominal aorta or iliac aneurysm. At least
moderate atherosclerotic plaque of the aorta and its branches. No
abdominal, pelvic, or inguinal lymphadenopathy.

Reproductive: Prostate is unremarkable.

Other: No intraperitoneal free fluid. No intraperitoneal free gas.
No organized fluid collection.

Musculoskeletal:

No abdominal wall hernia or abnormality.

No suspicious lytic or blastic osseous lesions. No acute displaced
fracture. Degenerative changes of the L5-S1 level.
IMPRESSION: 1. Acute on chronic pancreatitis with similar appearance of known
regions of pancreatic duct dilatation/pseudocysts with the largest
measuring 4.2 x 4.1 cm along the tail of the pancreas.
2.  Aortic Atherosclerosis ([id number]).

## 2023-04-24 ENCOUNTER — Encounter: Payer: Self-pay | Admitting: Gastroenterology

## 2023-04-26 ENCOUNTER — Telehealth (INDEPENDENT_AMBULATORY_CARE_PROVIDER_SITE_OTHER): Payer: Self-pay | Admitting: Primary Care

## 2023-04-26 NOTE — Telephone Encounter (Signed)
Called to inform pt about apt. No answer and could not leave VM.

## 2023-04-27 ENCOUNTER — Inpatient Hospital Stay (INDEPENDENT_AMBULATORY_CARE_PROVIDER_SITE_OTHER): Payer: MEDICAID | Admitting: Primary Care

## 2023-05-01 ENCOUNTER — Ambulatory Visit (INDEPENDENT_AMBULATORY_CARE_PROVIDER_SITE_OTHER): Payer: MEDICAID | Admitting: Primary Care

## 2023-05-01 ENCOUNTER — Encounter (INDEPENDENT_AMBULATORY_CARE_PROVIDER_SITE_OTHER): Payer: Self-pay | Admitting: Primary Care

## 2023-05-01 VITALS — BP 176/109 | HR 99 | Resp 16 | Ht 68.0 in | Wt 133.2 lb

## 2023-05-01 DIAGNOSIS — F17210 Nicotine dependence, cigarettes, uncomplicated: Secondary | ICD-10-CM | POA: Diagnosis not present

## 2023-05-01 DIAGNOSIS — I10 Essential (primary) hypertension: Secondary | ICD-10-CM | POA: Diagnosis not present

## 2023-05-01 DIAGNOSIS — D649 Anemia, unspecified: Secondary | ICD-10-CM | POA: Diagnosis not present

## 2023-05-01 DIAGNOSIS — M542 Cervicalgia: Secondary | ICD-10-CM

## 2023-05-01 DIAGNOSIS — M25512 Pain in left shoulder: Secondary | ICD-10-CM

## 2023-05-01 DIAGNOSIS — Z720 Tobacco use: Secondary | ICD-10-CM

## 2023-05-01 DIAGNOSIS — M25511 Pain in right shoulder: Secondary | ICD-10-CM

## 2023-05-01 DIAGNOSIS — Z7689 Persons encountering health services in other specified circumstances: Secondary | ICD-10-CM

## 2023-05-01 DIAGNOSIS — Z09 Encounter for follow-up examination after completed treatment for conditions other than malignant neoplasm: Secondary | ICD-10-CM

## 2023-05-01 MED ORDER — CLONIDINE HCL 0.1 MG PO TABS
0.1000 mg | ORAL_TABLET | Freq: Once | ORAL | Status: DC
Start: 2023-05-01 — End: 2023-06-16

## 2023-05-01 NOTE — Progress Notes (Signed)
Subjective:   [name] is a 52 y.o. male presents for hospital follow up and establish care. Admit date to the hospital was 04/09/23, patient was discharged from the hospital on 04/09/23, patient was admitted for:  C/o bilateral shoulder pain and left elbow- radiates from cervical down (origin) 6/10 hx of  complaints of abdominal pain nausea vomiting with ongoing alcohol abuse on 04/05/2023. Acute exacerbation of chronic pancreatitis due to alcohol use oxycodone.complaints of abdominal pain nausea vomiting with ongoing alcohol abuse 03/24/2023 to 03/30/2023 for acute exacerbation of chronic pancreatitis due to alcohol use . Today states only meds works is oxy explained did not prescribe narcotic. Became a little irritated stated he was a frequent flyer at the ED depending on thr doctor if he was given pain medication or not . Bp is extremly elevated smokes 14-15 cigarettes a day Past Medical History:  Diagnosis Date  . Alcoholism /alcohol abuse   . Anemia   . Anxiety   . Arthritis    knees; arms; elbows (03/26/2015)  . Asthma   . Bipolar disorder (HCC)   . Chronic bronchitis (HCC)   . Chronic lower back pain   . Chronic pancreatitis (HCC)   . Cocaine abuse (HCC)   . Depression   . Family history of adverse reaction to anesthesia   . Femoral condyle fracture (HCC) 03/08/2014   left medial/notes 03/09/2014  . GERD (gastroesophageal reflux disease)   . H/O hiatal hernia   . H/O suicide attempt 10/2012  . High cholesterol   . History of blood transfusion 10/2012   when I tried to commit suicide  . History of stomach ulcers   . Hypertension   . Marijuana abuse, continuous   . Migraine    a few times/year (03/26/2015)  . Pancreatitis   . Pneumonia 1990's X 3  . PTSD (post-traumatic stress disorder)   . Seizures (HCC)   . Sickle cell trait (HCC)   . WPW (Wolff-Parkinson-White syndrome)    [name] 03/06/2013     Allergies  Allergen Reactions  . Robaxin [Methocarbamol]  Other (See Comments)    jumpy limbs  . Aspirin Other (See Comments)    Unknown reaction  . Shellfish-Derived Products Nausea And Vomiting and Rash  . Trazodone And Nefazodone Other (See Comments)    Muscle spasms  . Adhesive [Tape] Itching  . Fish-Derived Products   . Latex Itching  . Toradol [Ketorolac Tromethamine] Other (See Comments)    Has ulcers; cannot have this  . Contrast Media [Iodinated Contrast Media] Hives  . Reglan [Metoclopramide] Other (See Comments)    Muscle spasms  . Salmon [Fish Oil] Nausea And Vomiting and Rash    Current Outpatient Medications on File Prior to Visit  Medication Sig Dispense Refill  . diltiazem (TIAZAC) 120 MG 24 hr capsule Take 1 capsule by mouth daily.    . famotidine (PEPCID) 20 MG tablet Take 1 tablet (20 mg total) by mouth 2 (two) times daily. 60 tablet 0  . fluticasone (FLONASE) 50 MCG/ACT nasal spray Place 2 sprays into both nostrils 2 (two) times daily as needed for allergies.    . folic acid (FOLVITE) 1 MG tablet Take 1 tablet (1 mg total) by mouth daily. 30 tablet 0  . lipase/protease/amylase (CREON) 36000 UNITS CPEP capsule Take 2 capsules (72,000 Units total) by mouth 3 (three) times daily with meals. May also take 1 capsule (36,000 Units total) as needed (with snacks). 240 capsule 0  . loperamide (IMODIUM) 2 MG  capsule Take 1 capsule (2 mg total) by mouth as needed for diarrhea or loose stools. 20 capsule 0  . metoprolol (TOPROL-XL) 200 MG 24 hr tablet Take 1 tablet (200 mg total) by mouth daily. 90 tablet 0  . omeprazole (PRILOSEC) 40 MG capsule Take 1 capsule by mouth daily.    . pregabalin (LYRICA) 100 MG capsule Take 100 mg by mouth 2 (two) times daily.    . Simethicone 40 MG/0.6ML LIQD Take 0.6 mLs (40 mg total) by mouth in the morning, at noon, and at bedtime for 7 days. (Patient taking differently: Take 40 mg by mouth 3 (three) times daily as needed (for bloating and gas).) 12.6 mL 0  . sucralfate (CARAFATE) 1 GM/10ML suspension  Take 10 mLs (1 g total) by mouth See admin instructions. Take 4 times with meals and at bedtime. (Patient taking differently: Take 1 g by mouth See admin instructions. Take 1g by mouth three times a day with meals.  May take an additional 1g with a snack.) 130 mL 4  . tamsulosin (FLOMAX) 0.4 MG CAPS capsule Take 1 capsule (0.4 mg total) by mouth daily. 30 capsule 1  . hydrocortisone cream 1 % Apply topically as needed for itching. (Patient not taking: Reported on 05/01/2023) 30 g 0  . liver oil-zinc oxide (DESITIN) 40 % ointment Apply topically 2 (two) times daily. Apply to buttocks (Patient not taking: Reported on 03/11/2023) 56 g 0  . nitroGLYCERIN (NITROSTAT) 0.4 MG SL tablet Place 0.4 mg under the tongue every 5 (five) minutes as needed for chest pain. (Patient not taking: Reported on 03/11/2023)    . ondansetron (ZOFRAN-ODT) 4 MG disintegrating tablet Take 1 tablet (4 mg total) by mouth every 8 (eight) hours as needed. (Patient not taking: Reported on 05/01/2023) 20 tablet 0  . pantoprazole (PROTONIX) 40 MG tablet Take 1 tablet (40 mg total) by mouth 2 (two) times daily before a meal. (Patient not taking: Reported on 05/01/2023) 180 tablet 0  . [DISCONTINUED] amitriptyline (ELAVIL) 25 MG tablet Take 1 tablet (25 mg total) by mouth at bedtime. (Patient not taking: Reported on 08/08/2019) 30 tablet 0   No current facility-administered medications on file prior to visit.    Review of System:  Comprehensive ROS Pertinent positive and negative noted in HPI   Objective:  BP (!) 169/119 (BP Location: Right Arm, Patient Position: Sitting, Cuff Size: Normal)   Pulse 99   Resp 16   Ht 5\' 8"  (1.727 m)   Wt 133 lb 3.2 oz (60.4 kg)   SpO2 100%   BMI 20.25 kg/m   Filed Weights   05/01/23 1505  Weight: 133 lb 3.2 oz (60.4 kg)    Physical Exam Vitals reviewed.  Constitutional:      Appearance: Normal appearance. He is normal weight.  HENT:     Head: Normocephalic.     Left Ear: Tympanic  membrane normal.     Nose: Nose normal.  Eyes:     Extraocular Movements: Extraocular movements intact.     Pupils: Pupils are equal, round, and reactive to light.  Cardiovascular:     Rate and Rhythm: Normal rate and regular rhythm.  Pulmonary:     Effort: Pulmonary effort is normal.     Breath sounds: Normal breath sounds.  Abdominal:     General: Abdomen is flat. Bowel sounds are normal.     Palpations: Abdomen is soft.  Musculoskeletal:        General: Normal range of motion.  Cervical back: Normal range of motion.  Skin:    General: Skin is warm and dry.  Neurological:     Mental Status: He is alert and oriented to person, place, and time.  Psychiatric:        Mood and Affect: Mood normal.        Behavior: Behavior normal.     Assessment:  [name] was seen today for hospitalization follow-up and shoulder pain.  Diagnoses and all orders for this visit:  Encounter to establish care  Hospital discharge follow-up  Essential hypertension  Tobacco abuse  Normocytic anemia   Bp elevated gave . 2mg  of clonidine left before Bp ck This note has been created with Education officer, environmental. Any transcriptional errors are unintentional.   No follow-ups on file.  Grayce Sessions, NP 05/01/2023, 3:10 PM

## 2023-05-02 IMAGING — CR DG CHEST 2V
2 series · 2 of 2 positions shown · non-contrast
Comparison: Chest radiograph 10/23/2020

CLINICAL DATA: Chest pain and shortness of breath

EXAM:
CHEST - 2 VIEW

[chest pa]
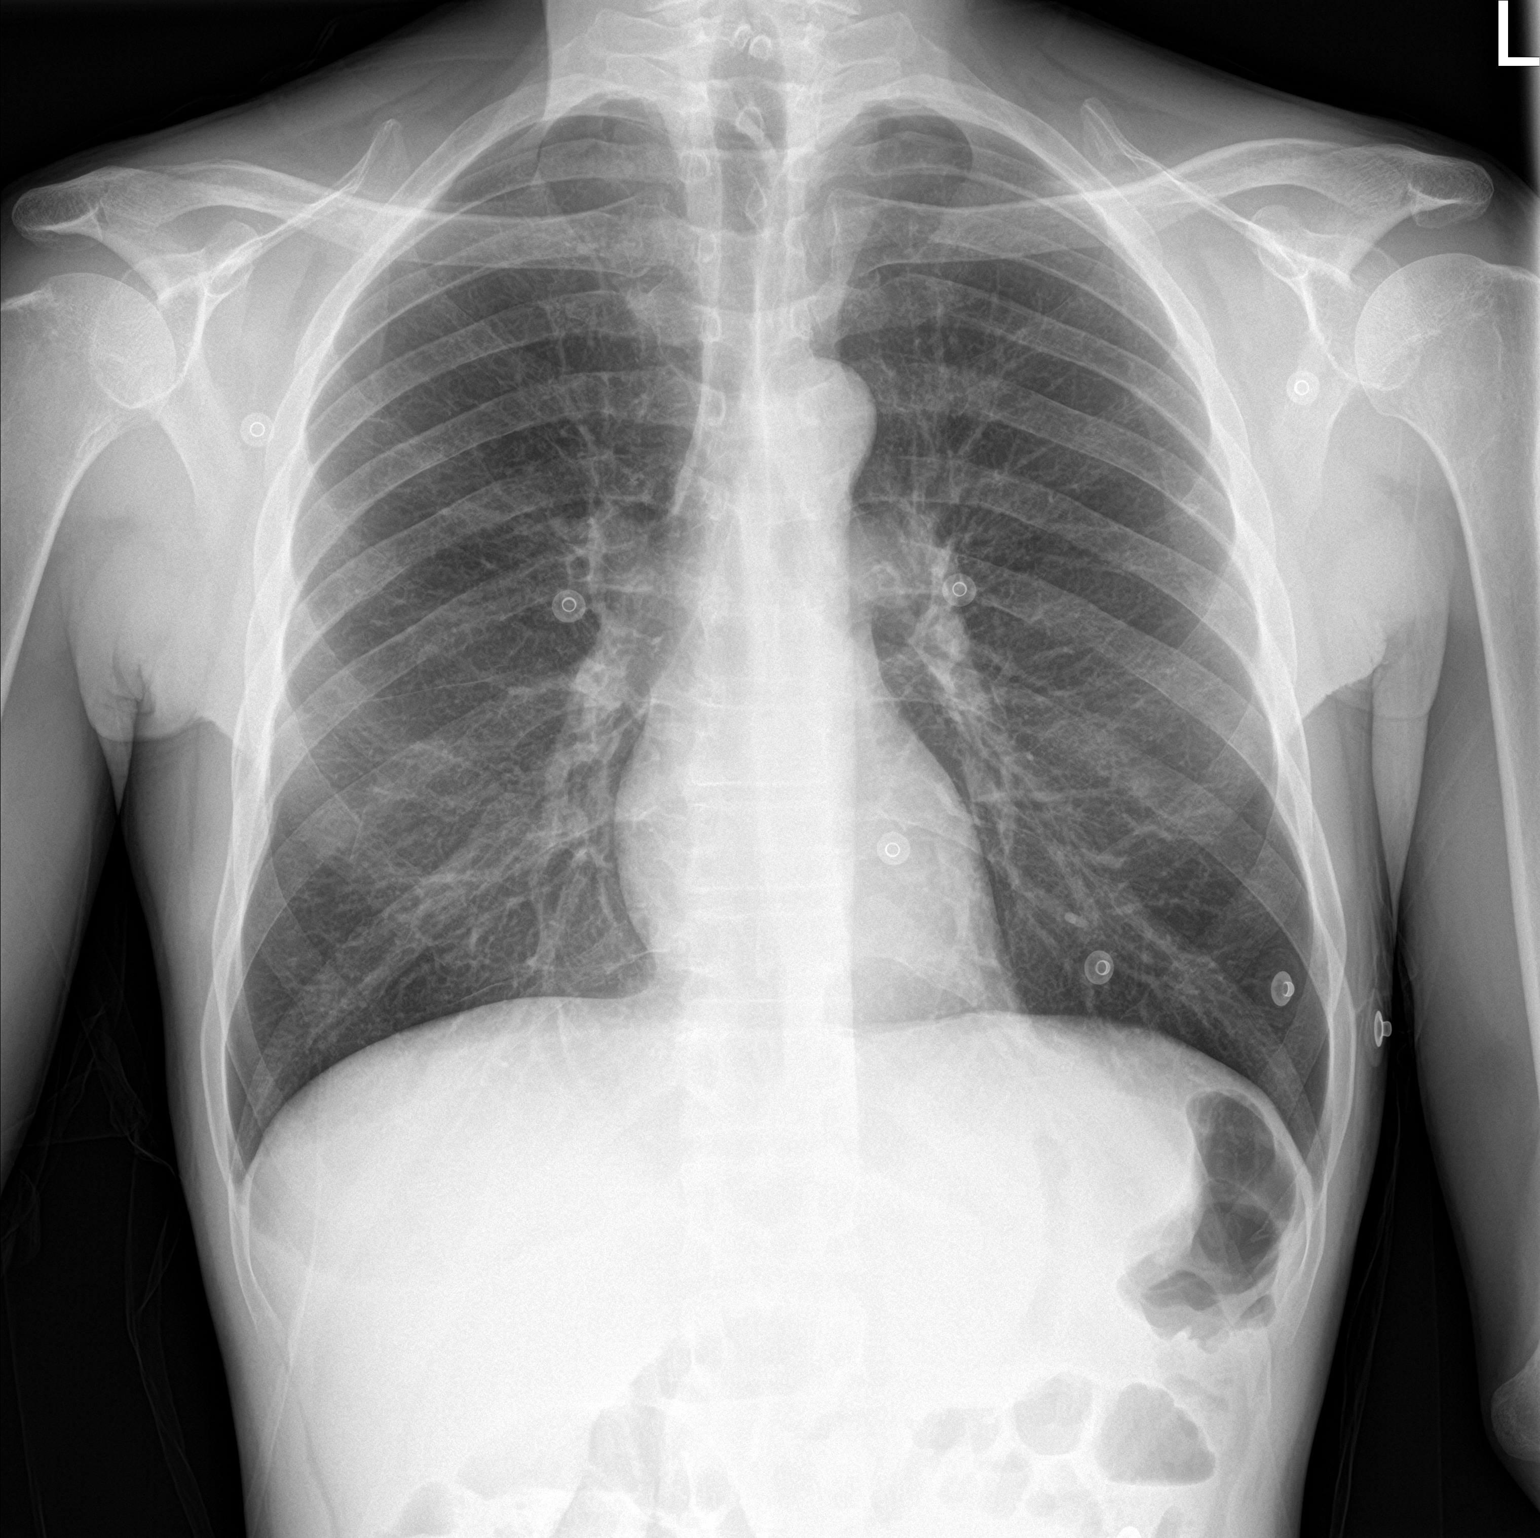

[chest lat]
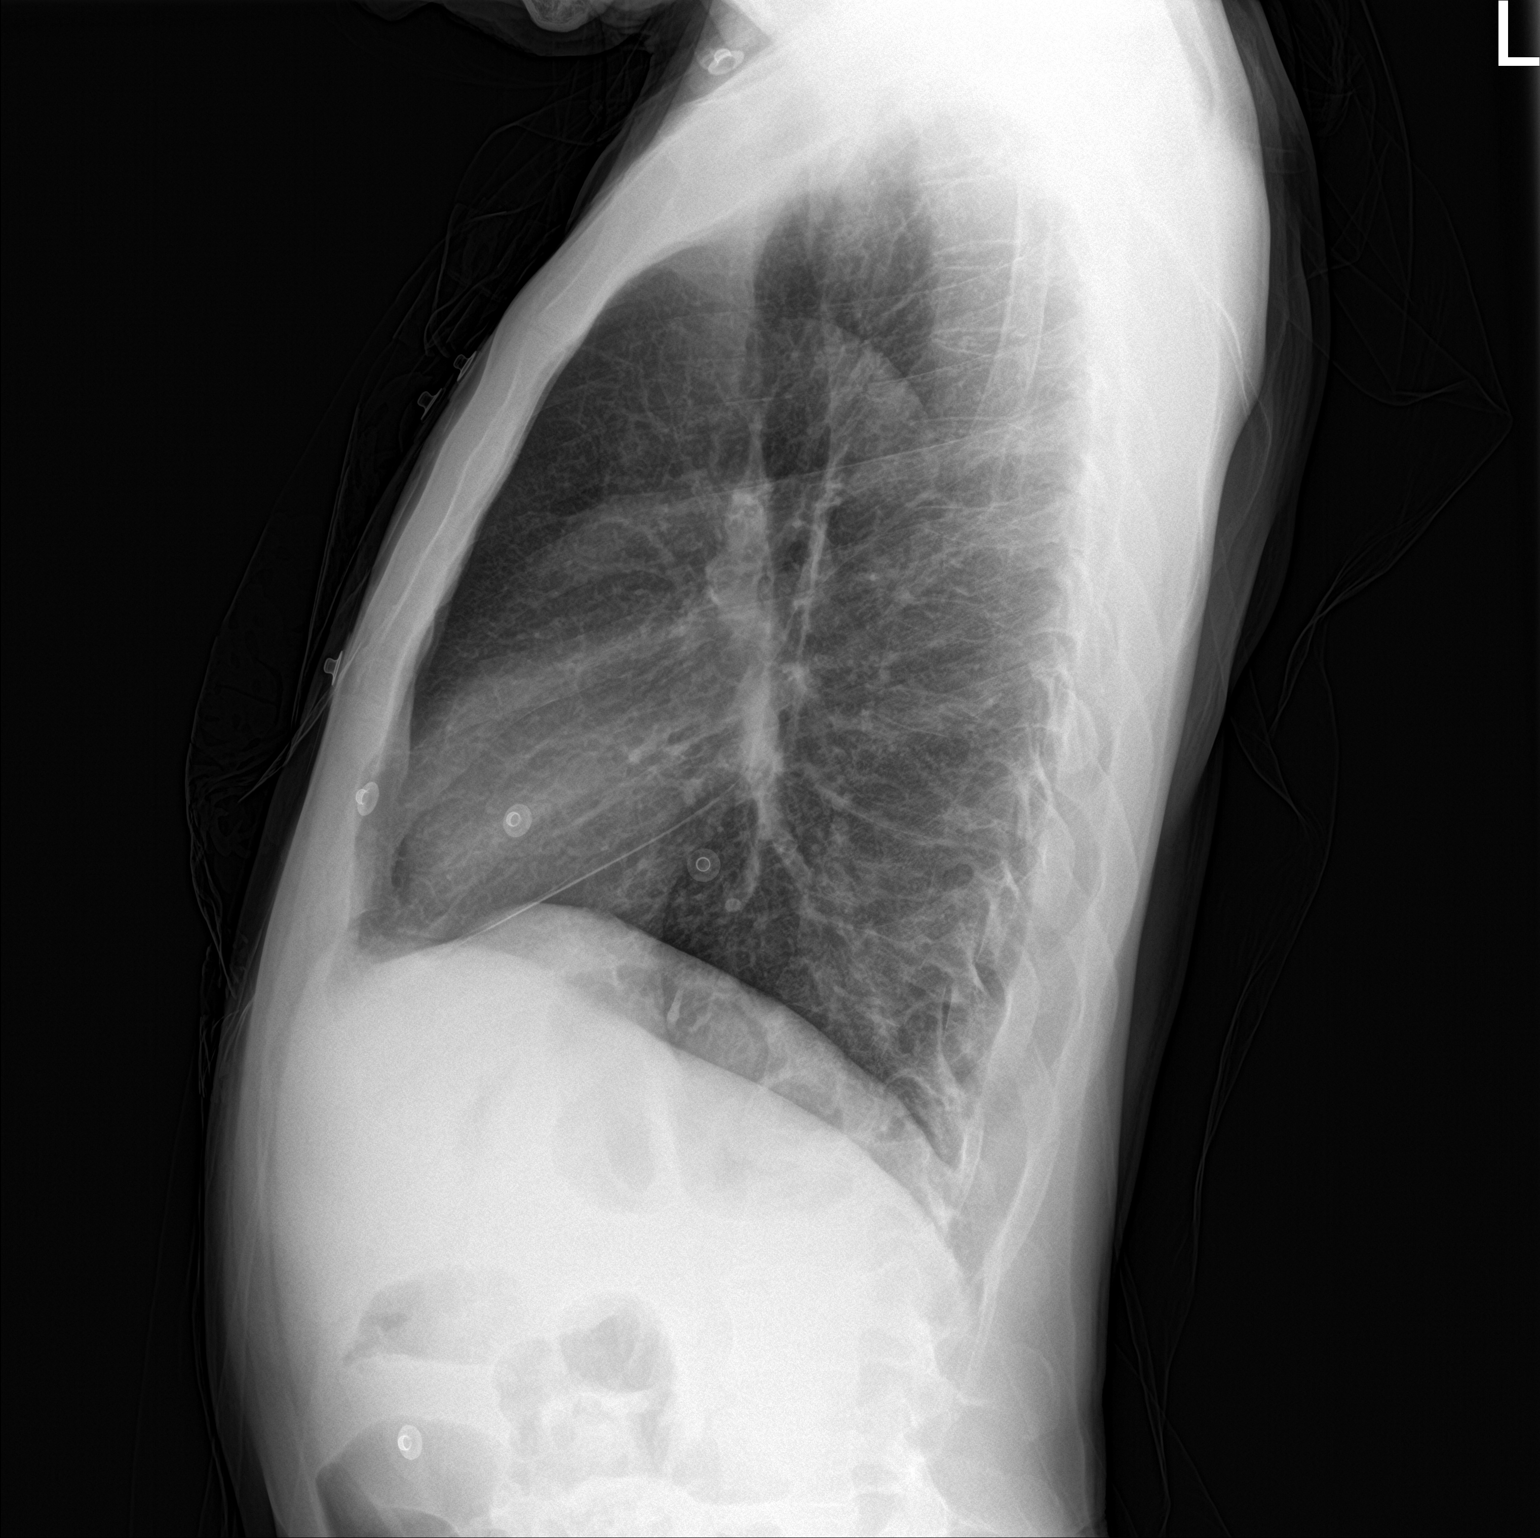

[2 of 2 positions shown; findings below may reference images not displayed]

FINDINGS: Mild chronic hyperinflation and bronchial thickening.The
cardiomediastinal contours are normal. Pulmonary vasculature is
normal. No consolidation, pleural effusion, or pneumothorax. No
acute osseous abnormalities are seen.
IMPRESSION: Mild chronic hyperinflation and bronchial thickening suggesting
COPD. No acute abnormality.

## 2023-05-03 IMAGING — CT CT ABD-PELV W/O CM
2 of 4 series · 16 of 46 positions shown, 18 images · non-contrast
Comparison: CT abdomen pelvis 10/30/2020

CLINICAL DATA: Pancreatitis and GI bleeding ulcer repair two weeks
ago Epigastric pain today

EXAM:
CT ABDOMEN AND PELVIS WITHOUT CONTRAST
TECHNIQUE: Multidetector CT imaging of the abdomen and pelvis was performed
following the standard protocol without IV contrast.

[Series 2: axial st · axial · 0.83mm/px · z∈[-441,-26]mm · 13 of 93 slices shown, 15 images]
[im 5/93  soft-tissue]
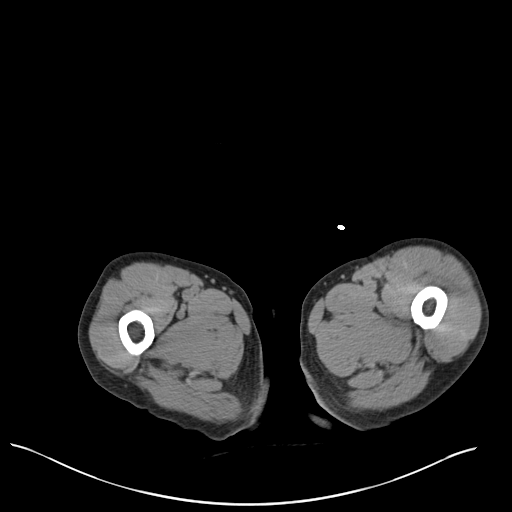
[im 5/93  bone]
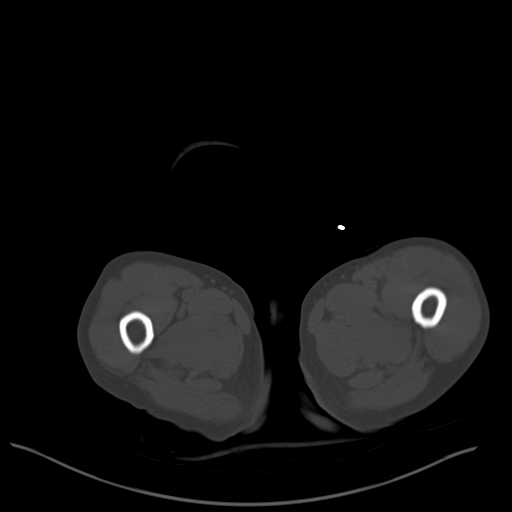
[im 14/93  soft-tissue]
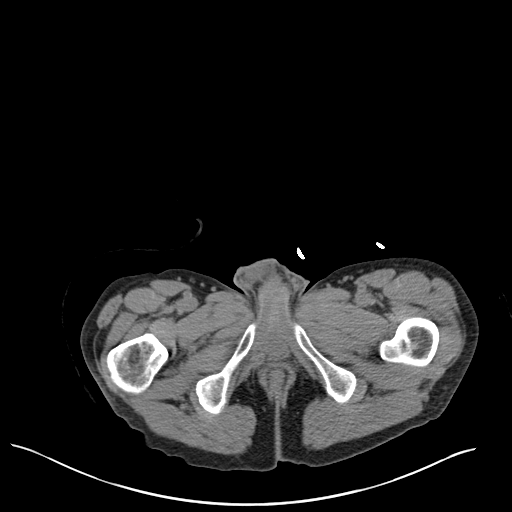
[im 19/93  soft-tissue]
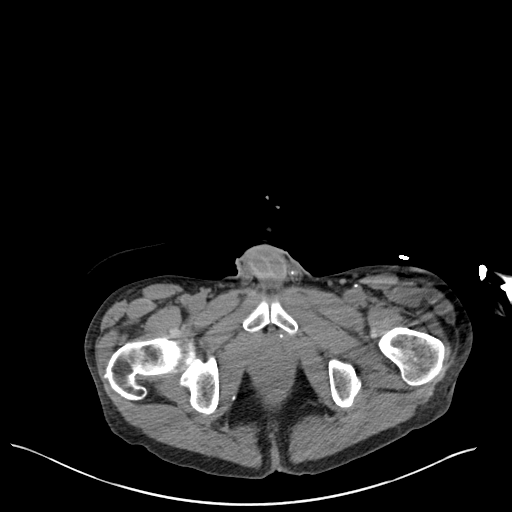
[im 28/93  soft-tissue]
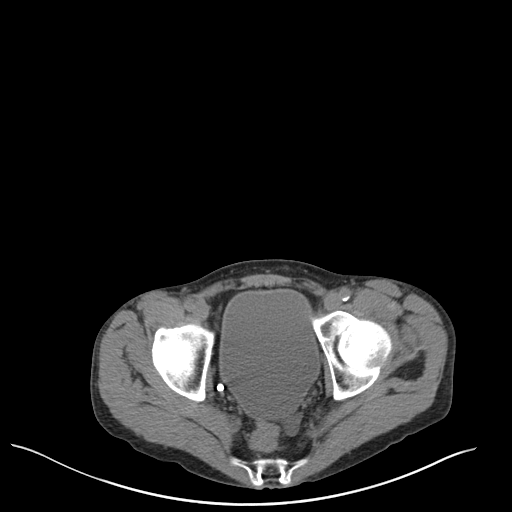
[im 33/93  soft-tissue]
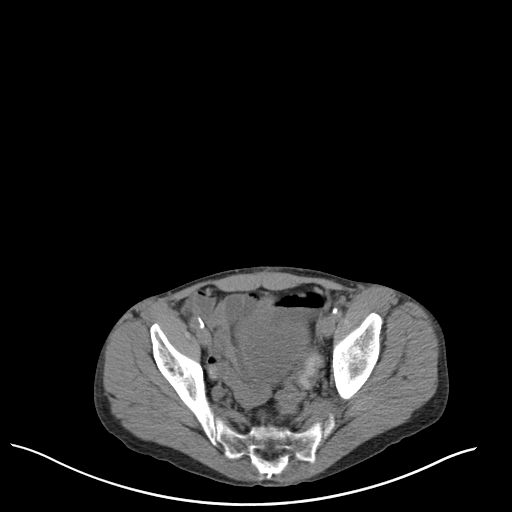
[im 42/93  soft-tissue]
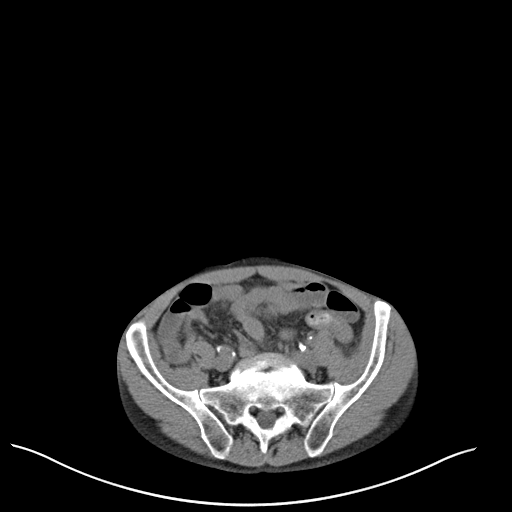
[im 47/93  soft-tissue]
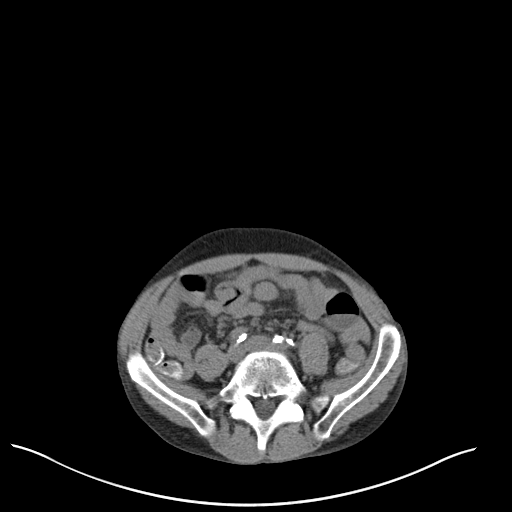
[im 51/93  soft-tissue]
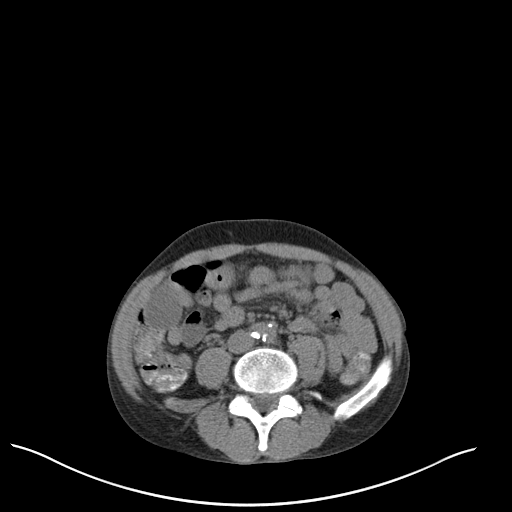
[im 60/93  soft-tissue]
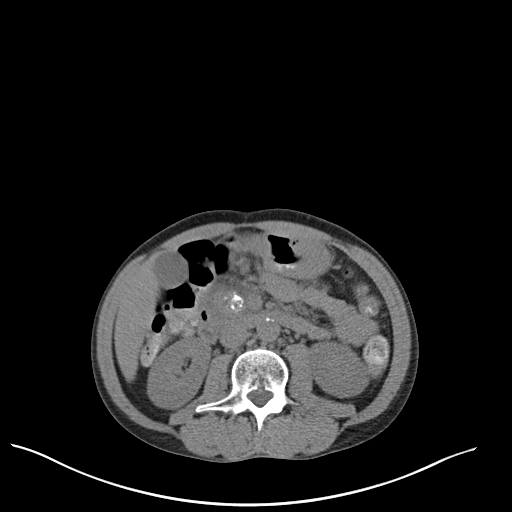
[im 60/93  bone]
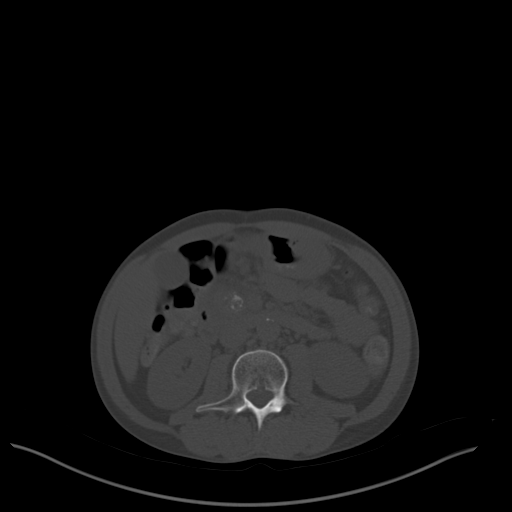
[im 65/93  soft-tissue]
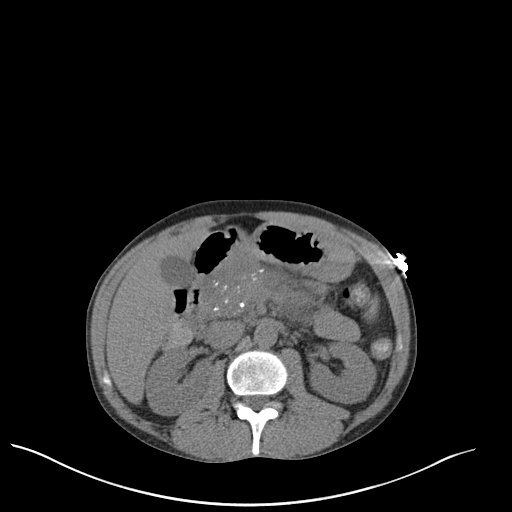
[im 74/93  soft-tissue]
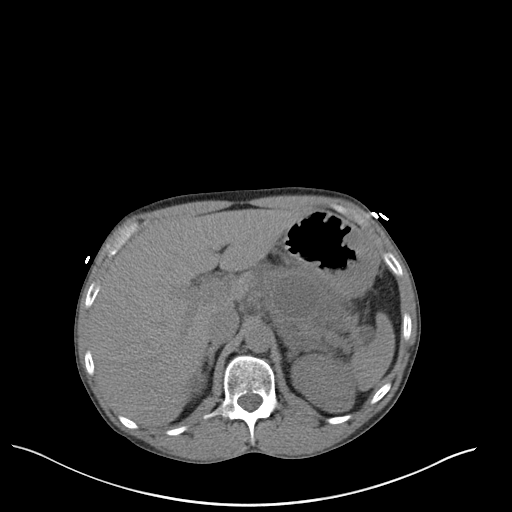
[im 79/93  soft-tissue]
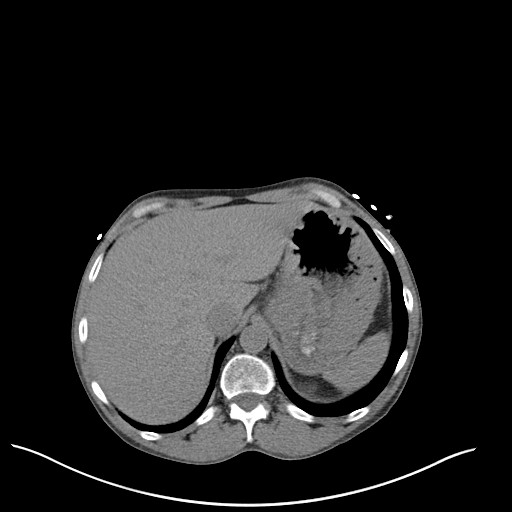
[im 88/93  soft-tissue]
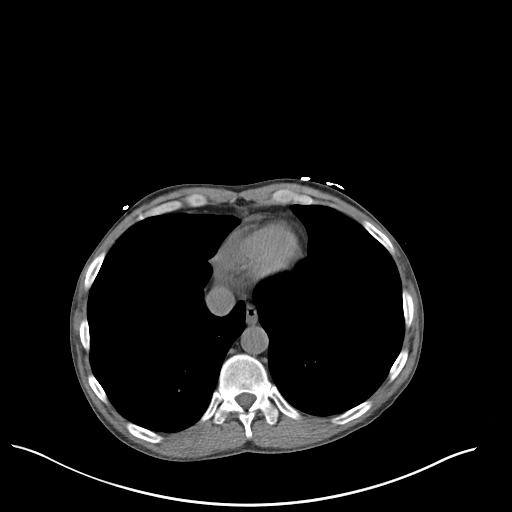

[Series 5: coronal st · coronal · 0.75mm/px · 3 of 114 slices shown]
[im 38/114  soft-tissue]
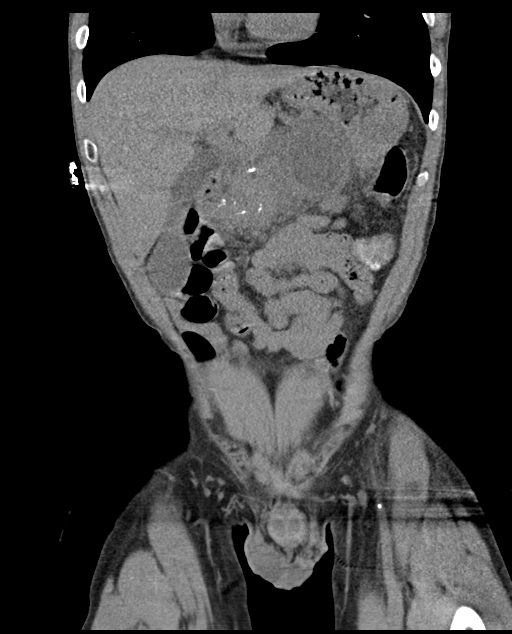
[im 51/114  soft-tissue]
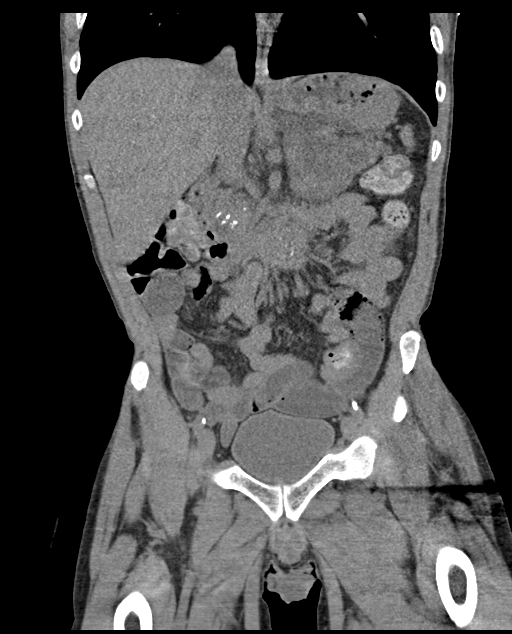
[im 63/114  soft-tissue]
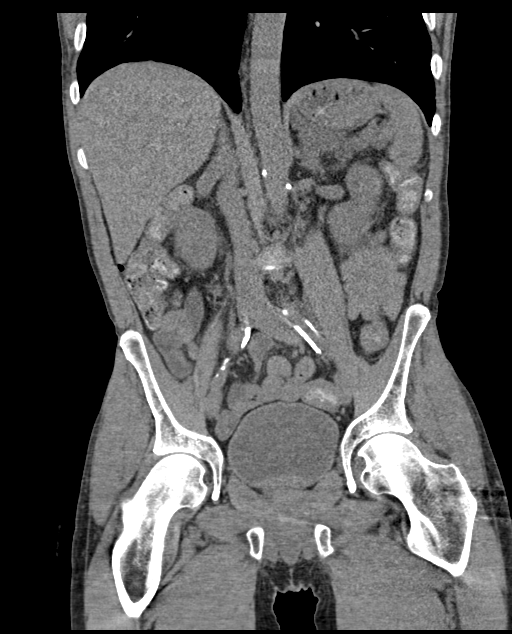

[16 of 46 positions shown; findings below may reference images not displayed]

FINDINGS: Lower chest: Unremarkable.

Hepatobiliary: No focal liver abnormality. No gallstones,
gallbladder wall thickening, or pericholecystic fluid. No biliary
dilatation.

Pancreas: Stable to slightly increased pancreatic parenchyma hazy
contour and fat stranding. Similar-appearing pancreatic tail 4.2 x
4.1 cm cystic lesion as well as several smaller lesions
distally consistent with a combination of foci of duct dilatation
and intraparenchymal pseudocysts that are better evaluated on prior
MRI in 1912. There is redemonstration of coarse calcifications as
well as similar-appearing cystic lesions. There is a region of both
cysts and calcifications within the pancreatic uncinate process that
appears similar compared to 10/23/2020.

Spleen: Normal in size without focal abnormality.

Adrenals/Urinary Tract:
No adrenal nodule bilaterally. No nephrolithiasis, no
hydronephrosis, and no contour-deforming
renal mass. No ureterolithiasis or hydroureter. The urinary bladder
is unremarkable.

Stomach/Bowel: Stomach is within normal limits. No evidence of bowel
wall thickening or dilatation. Appendix appears normal.

Vascular/Lymphatic: No abdominal aorta or iliac aneurysm. At least
moderate atherosclerotic plaque of the aorta and its branches. No
abdominal, pelvic, or inguinal lymphadenopathy.

Reproductive: Prostate is unremarkable.

Other: No intraperitoneal free fluid. No intraperitoneal free gas.
No organized fluid collection.

Musculoskeletal:
No abdominal wall hernia or abnormality.
No suspicious lytic or blastic osseous lesions. No acute displaced
fracture. Degenerative changes of the L5-S1 level.
IMPRESSION: 1. Acute on chronic pancreatitis with grossly similar appearance
(limited evaluation with noncontrast study) of known regions of
pancreatic duct dilatation/pseudocysts with the largest measuring
4.2 x 4.1 cm along the tail of the pancreas. Consider MR pancreatic
protocol for further evaluation.
2. Aortic Atherosclerosis (7MPXX-LMY.Y).

## 2023-05-10 IMAGING — US US PELVIS LIMITED
1 series · 14 of 14 positions shown · non-contrast
Comparison: None.

CLINICAL DATA: Bladder distension, prostate enlargement

EXAM:
ULTRASOUND OF THE MALE PELVIS

[Series 1: us pelvis limited (transabdominal only) · 14 of 14 slices shown]
[im 1/14]
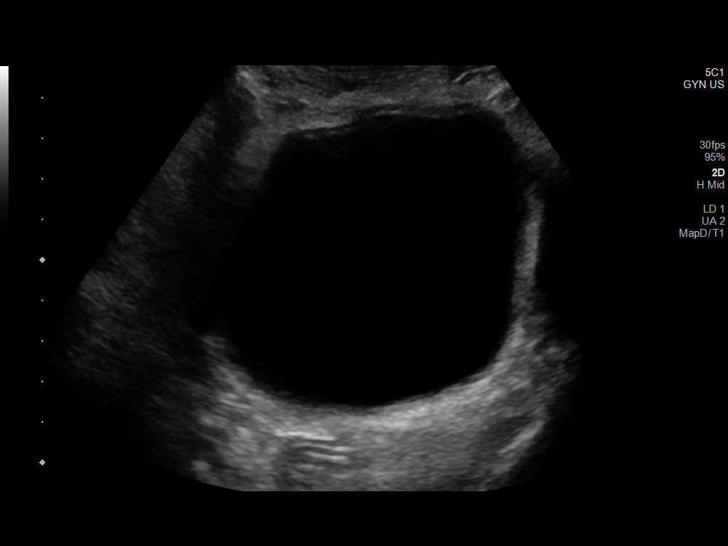
[im 2/14]
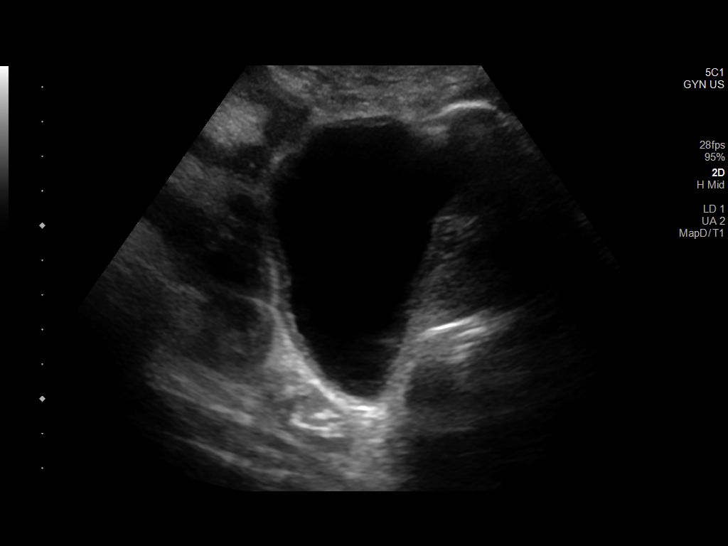
[im 3/14]
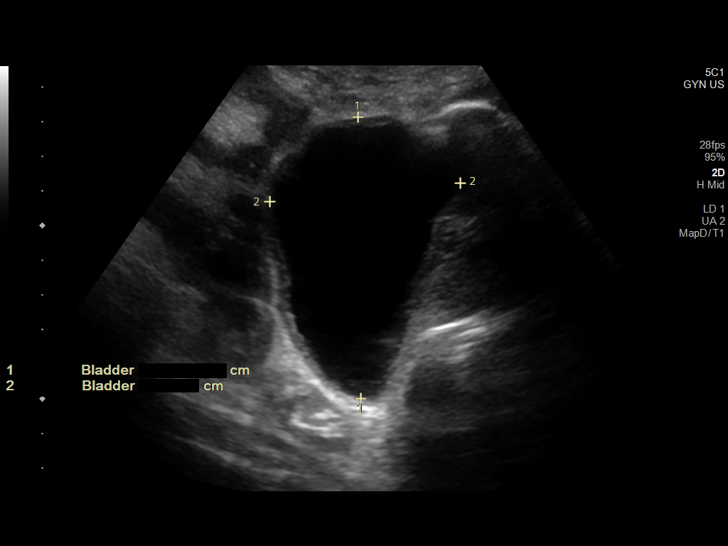
[im 4/14]
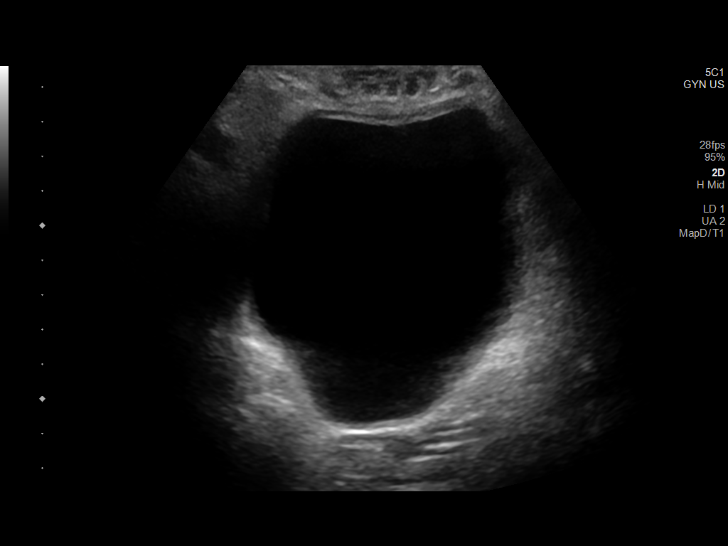
[im 5/14]
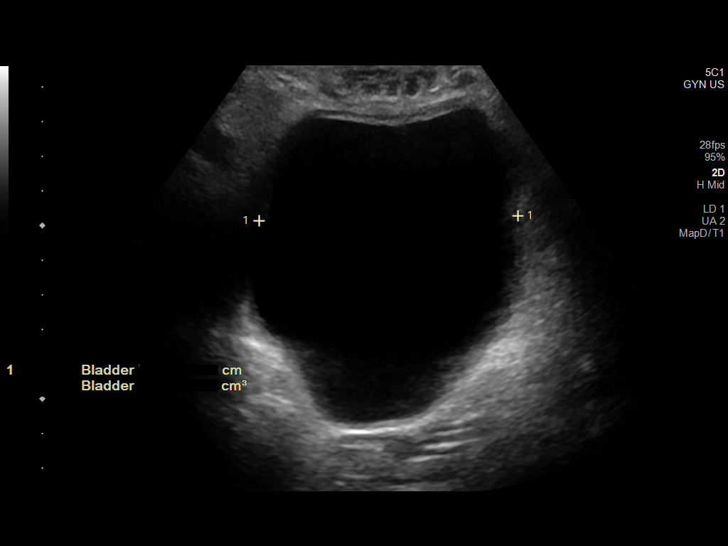
[im 6/14]
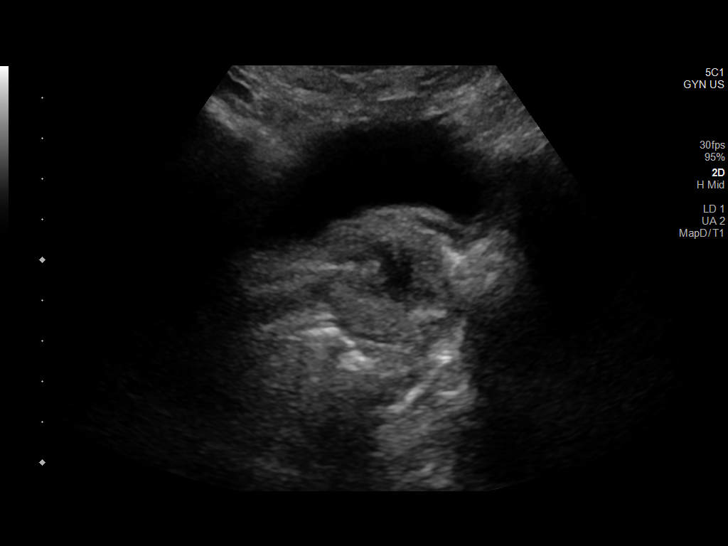
[im 7/14]
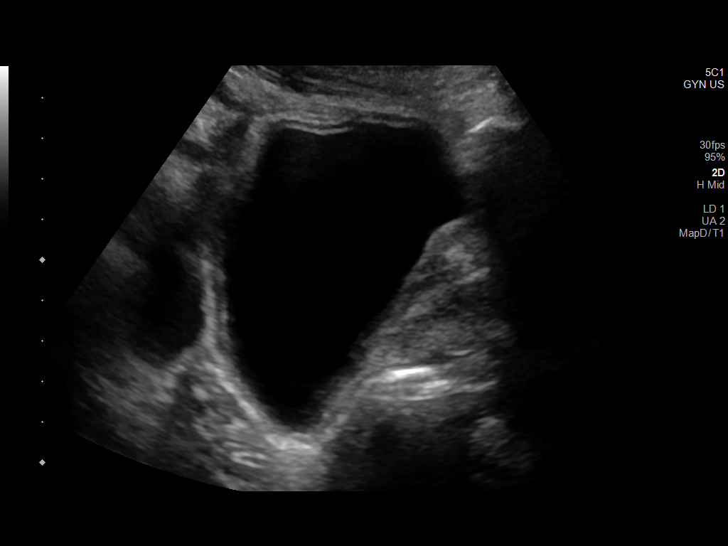
[im 8/14]
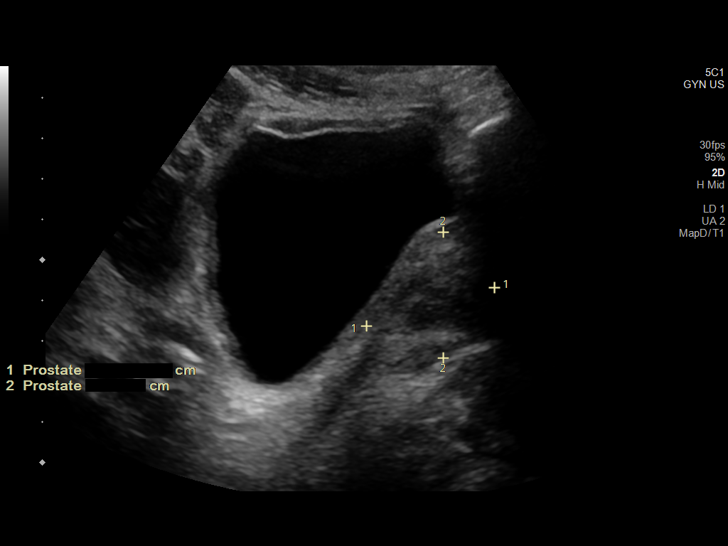
[im 9/14]
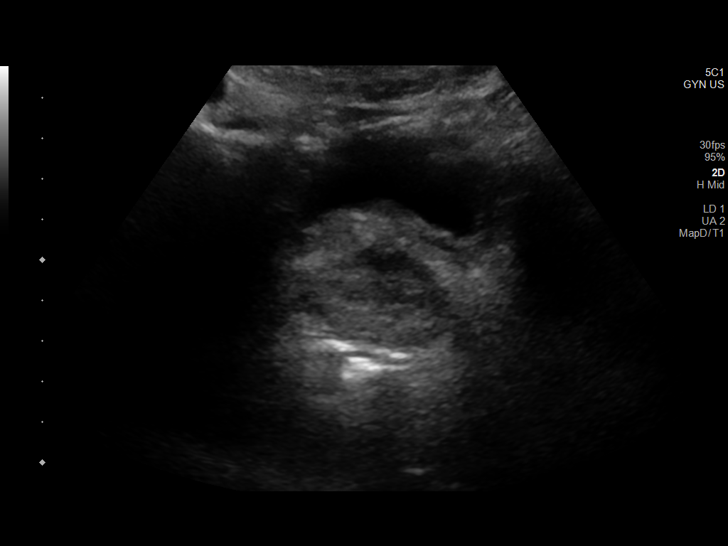
[im 10/14]
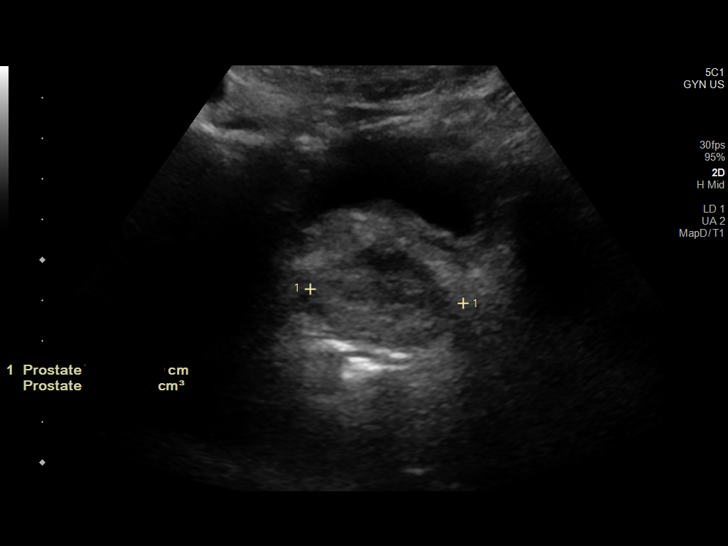
[im 11/14]
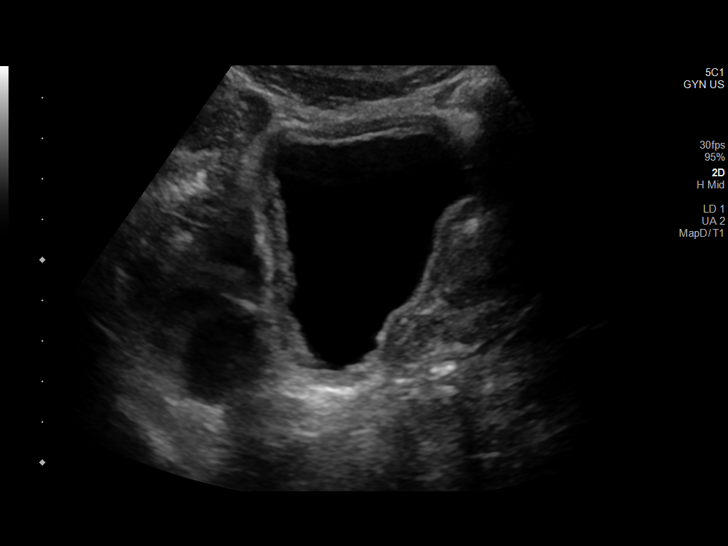
[im 12/14]
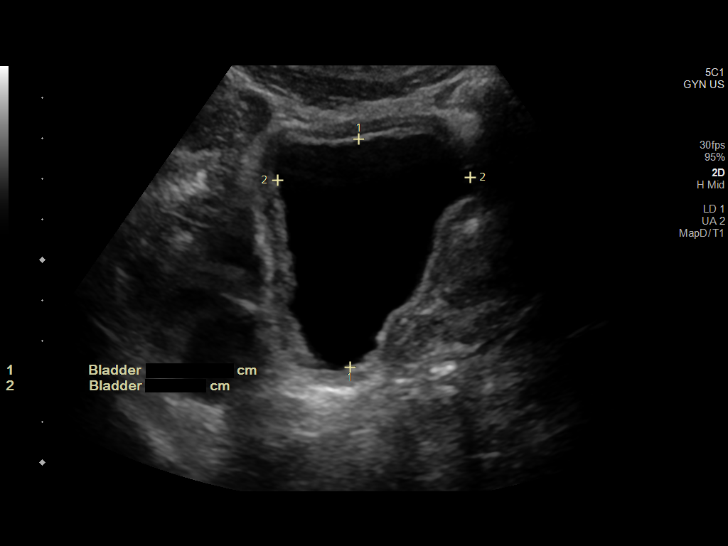
[im 13/14]
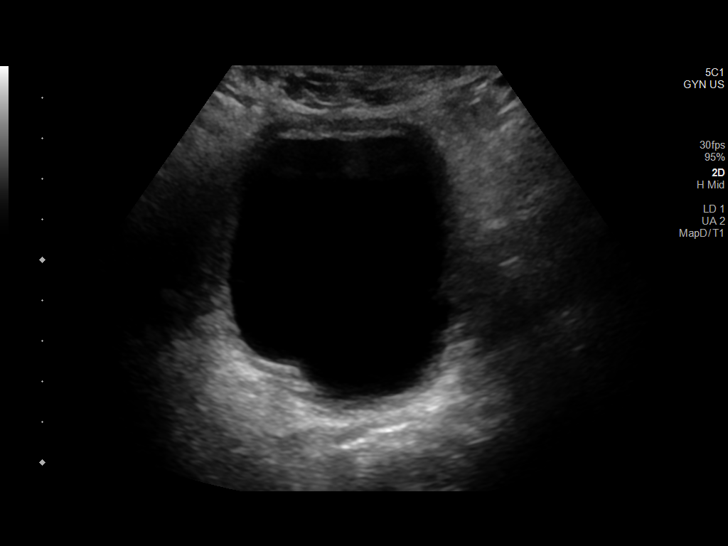
[im 14/14]
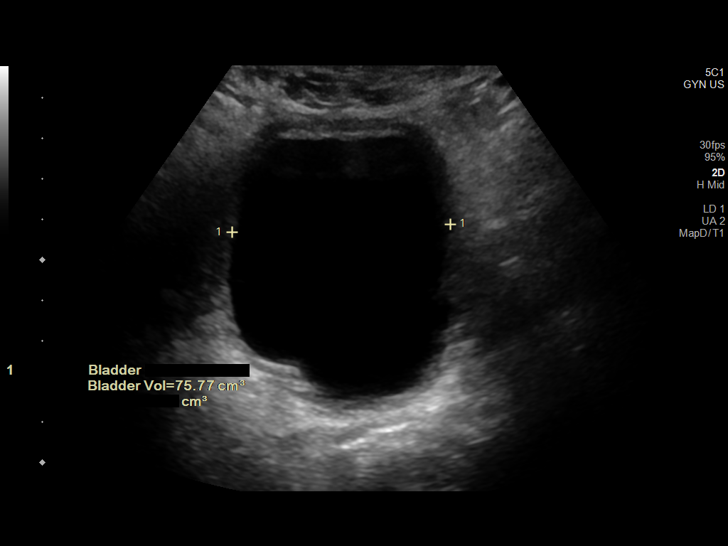

[14 of 14 positions shown; findings below may reference images not displayed]

FINDINGS: Bladder: Prevoid volume of 176 mL, postvoid residual of 76 mL.
Postvoid bladder wall thickening is likely related to
underdistention. No visible bladder calculi or debris.

Prostate gland: Prostate gland measures 3.3 x 3.1 x 3.8 cm in size
for a normal prostate volume of 20.4 mL.

Seminal vesicles:  Not well visualized.
IMPRESSION: Prostate volume of 20.4 mL is within normal limits (less than 30
mL).

Prevoid volume of 176 mL with a postvoid residual of 76 mL, upper
limits normal.

## 2023-05-26 ENCOUNTER — Emergency Department (HOSPITAL_COMMUNITY): Payer: MEDICAID

## 2023-05-26 ENCOUNTER — Encounter (HOSPITAL_COMMUNITY): Payer: Self-pay | Admitting: *Deleted

## 2023-05-26 ENCOUNTER — Emergency Department (HOSPITAL_COMMUNITY)
Admission: EM | Admit: 2023-05-26 | Discharge: 2023-05-26 | Disposition: A | Discharge status: Home / Self Care | Payer: MEDICAID | Attending: Emergency Medicine | Admitting: Emergency Medicine

## 2023-05-26 ENCOUNTER — Other Ambulatory Visit: Payer: Self-pay

## 2023-05-26 DIAGNOSIS — R1084 Generalized abdominal pain: Secondary | ICD-10-CM | POA: Diagnosis not present

## 2023-05-26 DIAGNOSIS — K860 Alcohol-induced chronic pancreatitis: Secondary | ICD-10-CM

## 2023-05-26 DIAGNOSIS — Z91040 Latex allergy status: Secondary | ICD-10-CM | POA: Insufficient documentation

## 2023-05-26 DIAGNOSIS — R42 Dizziness and giddiness: Secondary | ICD-10-CM | POA: Insufficient documentation

## 2023-05-26 DIAGNOSIS — R079 Chest pain, unspecified: Secondary | ICD-10-CM | POA: Diagnosis not present

## 2023-05-26 DIAGNOSIS — R0602 Shortness of breath: Secondary | ICD-10-CM | POA: Diagnosis not present

## 2023-05-26 DIAGNOSIS — I10 Essential (primary) hypertension: Secondary | ICD-10-CM | POA: Insufficient documentation

## 2023-05-26 DIAGNOSIS — R109 Unspecified abdominal pain: Secondary | ICD-10-CM | POA: Diagnosis present

## 2023-05-26 DIAGNOSIS — R197 Diarrhea, unspecified: Secondary | ICD-10-CM | POA: Diagnosis not present

## 2023-05-26 DIAGNOSIS — Z79899 Other long term (current) drug therapy: Secondary | ICD-10-CM | POA: Insufficient documentation

## 2023-05-26 DIAGNOSIS — K219 Gastro-esophageal reflux disease without esophagitis: Secondary | ICD-10-CM | POA: Insufficient documentation

## 2023-05-26 DIAGNOSIS — R531 Weakness: Secondary | ICD-10-CM | POA: Insufficient documentation

## 2023-05-26 DIAGNOSIS — R112 Nausea with vomiting, unspecified: Secondary | ICD-10-CM | POA: Diagnosis not present

## 2023-05-26 LAB — CBC
HCT: 35.7 % — ABNORMAL LOW (ref 39.0–52.0)
Hemoglobin: 11 g/dL — ABNORMAL LOW (ref 13.0–17.0)
MCH: 27.8 pg (ref 26.0–34.0)
MCHC: 30.8 g/dL (ref 30.0–36.0)
MCV: 90.2 fL (ref 80.0–100.0)
Platelets: 152 10*3/uL (ref 150–400)
RBC: 3.96 MIL/uL — ABNORMAL LOW (ref 4.22–5.81)
RDW: 25.1 % — ABNORMAL HIGH (ref 11.5–15.5)
WBC: 6 10*3/uL (ref 4.0–10.5)
nRBC: 0 % (ref 0.0–0.2)

## 2023-05-26 LAB — COMPREHENSIVE METABOLIC PANEL
ALT: 22 U/L (ref 0–44)
AST: 71 U/L — ABNORMAL HIGH (ref 15–41)
Albumin: 3.9 g/dL (ref 3.5–5.0)
Alkaline Phosphatase: 71 U/L (ref 38–126)
Anion gap: 11 (ref 5–15)
BUN: 5 mg/dL — ABNORMAL LOW (ref 6–20)
CO2: 14 mmol/L — ABNORMAL LOW (ref 22–32)
Calcium: 7.1 mg/dL — ABNORMAL LOW (ref 8.9–10.3)
Chloride: 113 mmol/L — ABNORMAL HIGH (ref 98–111)
Creatinine, Ser: 0.96 mg/dL (ref 0.61–1.24)
GFR, Estimated: >60 mL/min (ref >60)
Glucose, Bld: 91 mg/dL (ref 70–99)
Potassium: 4 mmol/L (ref 3.5–5.1)
Sodium: 138 mmol/L (ref 135–145)
Total Bilirubin: 0.9 mg/dL (ref 0.0–1.2)
Total Protein: 7.2 g/dL (ref 6.5–8.1)

## 2023-05-26 LAB — URINALYSIS, ROUTINE W REFLEX MICROSCOPIC
Bilirubin Urine: NEGATIVE
Glucose, UA: NEGATIVE mg/dL
Hgb urine dipstick: NEGATIVE
Ketones, ur: NEGATIVE mg/dL
Leukocytes,Ua: NEGATIVE
Nitrite: NEGATIVE
Protein, ur: NEGATIVE mg/dL
Specific Gravity, Urine: 1.012 (ref 1.005–1.030)
pH: 5 (ref 5.0–8.0)

## 2023-05-26 LAB — TROPONIN I (HIGH SENSITIVITY): Troponin I (High Sensitivity): 5 ng/L (ref <18)

## 2023-05-26 LAB — LIPASE, BLOOD: Lipase: 24 U/L (ref 11–51)

## 2023-05-26 MED ORDER — ONDANSETRON HCL 4 MG PO TABS: 4.0000 mg | ORAL_TABLET | Freq: Three times a day (TID) | ORAL | 0 refills | Status: DC | PRN

## 2023-05-26 MED ORDER — LIDOCAINE VISCOUS HCL 2 % MT SOLN
15.0000 mL | Freq: Once | OROMUCOSAL | Status: AC
  Administered 2023-05-26: 15 mL via ORAL
  Filled 2023-05-26: qty 15

## 2023-05-26 MED ORDER — ALUM & MAG HYDROXIDE-SIMETH 200-200-20 MG/5ML PO SUSP
30.0000 mL | Freq: Once | ORAL | Status: AC
  Administered 2023-05-26: 30 mL via ORAL
  Filled 2023-05-26: qty 30

## 2023-05-26 MED ORDER — PANTOPRAZOLE SODIUM 40 MG PO TBEC: 40.0000 mg | DELAYED_RELEASE_TABLET | Freq: Two times a day (BID) | ORAL | 2 refills | Status: DC

## 2023-05-26 MED ORDER — SUCRALFATE 1 GM/10ML PO SUSP: 1.0000 g | Freq: Three times a day (TID) | ORAL | 0 refills | Status: DC

## 2023-05-26 MED ORDER — ONDANSETRON HCL 4 MG/2ML IJ SOLN
4.0000 mg | Freq: Once | INTRAMUSCULAR | Status: AC
Start: 2023-05-26 — End: 2023-05-26
  Administered 2023-05-26: 4 mg via INTRAVENOUS
  Filled 2023-05-26: qty 2

## 2023-05-26 MED ORDER — OXYCODONE HCL 10 MG PO TABS: 10.0000 mg | ORAL_TABLET | Freq: Four times a day (QID) | ORAL | 0 refills | Status: AC | PRN

## 2023-05-26 MED ORDER — FENTANYL CITRATE PF 50 MCG/ML IJ SOSY
50.0000 ug | PREFILLED_SYRINGE | Freq: Once | INTRAMUSCULAR | Status: AC
  Administered 2023-05-26: 50 ug via INTRAVENOUS
  Filled 2023-05-26: qty 1

## 2023-05-26 NOTE — ED Triage Notes (Signed)
Patient presents to ED via [facility]  c/o N/V/D  onset yest c/o sharp chest pain this am.

## 2023-05-26 NOTE — ED Provider Notes (Signed)
[place] EMERGENCY DEPARTMENT AT [facility] Provider Note   CSN: [id number] Arrival date & time: 05/26/23  4098     History  Chief Complaint  Patient presents with   Abdominal Pain   Emesis   Diarrhea   Chest Pain    [name] is a 53 y.o. male with medical history of chronic bronchitis, chronic low back pain, chronic pancreatitis, cocaine abuse, depression, GERD, hypertension, alcohol abuse.  Patient presents to ED for evaluation of abdominal pain, chest pain shortness of breath.  Patient reports that yesterday he developed abdominal pain consistent with past pancreatitis flareups.  He reports that he is had diarrhea, nausea and vomiting since this time but denies any blood in his stool.  Also complaining of chest pain and shortness of breath worse with exertion beginning yesterday.  Endorsing lightheadedness, dizziness, weakness.  Denies fevers at home.  Endorsing nausea, vomiting and diarrhea.  States he has not been drinking any alcohol.  Reports that his last alcohol intake was on Tuesday.  Denies any drug use.  Denies dysuria, flank pain.   Abdominal Pain Associated symptoms: chest pain, diarrhea, nausea, shortness of breath and vomiting   Associated symptoms: no dysuria and no fever   Emesis Associated symptoms: abdominal pain and diarrhea   Associated symptoms: no fever   Diarrhea Associated symptoms: abdominal pain and vomiting   Associated symptoms: no fever   Chest Pain Associated symptoms: abdominal pain, nausea, shortness of breath and vomiting   Associated symptoms: no fever        Home Medications Prior to Admission medications   Medication Sig Start Date End Date Taking? Authorizing Provider  acetaminophen (TYLENOL) 650 MG CR tablet Take 1,300 mg by mouth every 8 (eight) hours as needed for pain.   Yes [provider]  bismuth subsalicylate (KAOPECTATE) 262 MG/15ML suspension Take 15 mLs by mouth every 6 (six) hours as needed  for indigestion or diarrhea or loose stools.   Yes [provider]  fluticasone (FLONASE) 50 MCG/ACT nasal spray Place 2 sprays into both nostrils daily as needed for allergies.   Yes [provider]  folic acid (FOLVITE) 1 MG tablet Take 1 tablet (1 mg total) by mouth daily. 09/21/22  Yes Pokhrel, Laxman, MD  lipase/protease/amylase (CREON) 36000 UNITS CPEP capsule Take 2 capsules (72,000 Units total) by mouth 3 (three) times daily with meals. May also take 1 capsule (36,000 Units total) as needed (with snacks). 01/26/23  Yes Jerald Kief, MD  magnesium oxide (MAG-OX) 400 (240 Mg) MG tablet Take 400 mg by mouth daily. 04/07/23  Yes [provider]  metoprolol (TOPROL-XL) 200 MG 24 hr tablet Take 1 tablet (200 mg total) by mouth daily. 03/07/23  Yes Rodolph Bong, MD  nitroGLYCERIN (NITROSTAT) 0.4 MG SL tablet Place 0.4 mg under the tongue every 5 (five) minutes as needed for chest pain.   Yes [provider]  omeprazole (PRILOSEC) 40 MG capsule Take 1 capsule by mouth daily. 04/19/23  Yes [provider]  ondansetron (ZOFRAN-ODT) 4 MG disintegrating tablet Take 1 tablet (4 mg total) by mouth every 8 (eight) hours as needed. 04/09/23  Yes Loetta Rough, MD  Oxycodone HCl 10 MG TABS Take 10 mg by mouth every 6 (six) hours as needed (pain). 04/09/23  Yes [provider]  Simethicone (GAS-X PO) Take 1 capsule by mouth 2 (two) times daily as needed (gas).   Yes [provider]  diltiazem (TIAZAC) 120 MG 24  hr capsule Take 1 capsule by mouth in the morning and at bedtime. Patient not taking: Reported on 05/26/2023 04/13/23 07/12/23  [provider]  famotidine (PEPCID) 20 MG tablet Take 1 tablet (20 mg total) by mouth 2 (two) times daily. Patient not taking: Reported on 05/26/2023 03/07/23   Rodolph Bong, MD  hydrocortisone cream 1 % Apply topically as needed for itching. Patient not taking: Reported on 05/01/2023 03/07/23    Rodolph Bong, MD  liver oil-zinc oxide (DESITIN) 40 % ointment Apply topically 2 (two) times daily. Apply to buttocks Patient not taking: Reported on 03/11/2023 03/07/23   Rodolph Bong, MD  loperamide (IMODIUM) 2 MG capsule Take 1 capsule (2 mg total) by mouth as needed for diarrhea or loose stools. Patient not taking: Reported on 05/26/2023 03/07/23   Rodolph Bong, MD  pantoprazole (PROTONIX) 40 MG tablet Take 1 tablet (40 mg total) by mouth 2 (two) times daily before a meal. Patient not taking: Reported on 05/01/2023 03/07/23   Rodolph Bong, MD  pregabalin (LYRICA) 100 MG capsule Take 100 mg by mouth 2 (two) times daily. Patient not taking: Reported on 05/26/2023 02/26/23   [provider]  sucralfate (CARAFATE) 1 GM/10ML suspension Take 10 mLs (1 g total) by mouth See admin instructions. Take 4 times with meals and at bedtime. Patient not taking: Reported on 05/26/2023 01/11/23   Jerald Kief, MD  tamsulosin (FLOMAX) 0.4 MG CAPS capsule Take 1 capsule (0.4 mg total) by mouth daily. Patient not taking: Reported on 05/26/2023 03/08/23   Rodolph Bong, MD  amitriptyline (ELAVIL) 25 MG tablet Take 1 tablet (25 mg total) by mouth at bedtime. Patient not taking: Reported on 08/08/2019 10/15/18 08/08/19  Rolly Salter, MD      Allergies    Robaxin [methocarbamol], Aspirin, Shellfish-derived products, Trazodone and nefazodone, Adhesive [tape], Fish-derived products, Latex, Toradol [ketorolac tromethamine], Contrast media [iodinated contrast media], Reglan [metoclopramide], and Salmon [fish oil]    Review of Systems   Review of Systems  Constitutional:  Negative for fever.  Respiratory:  Positive for shortness of breath.   Cardiovascular:  Positive for chest pain.  Gastrointestinal:  Positive for abdominal pain, diarrhea, nausea and vomiting.  Genitourinary:  Negative for dysuria.  All other systems reviewed and are negative.   Physical Exam Updated Vital  Signs BP (!) 162/107 (BP Location: Left Arm)   Pulse 81   Temp 98.6 F (37 C) (Oral)   Resp 18   Ht 5\' 8"  (1.727 m)   Wt 60.4 kg   SpO2 100%   BMI 20.25 kg/m  Physical Exam Vitals and nursing note reviewed.  Constitutional:      General: He is not in acute distress.    Appearance: Normal appearance. He is not ill-appearing, toxic-appearing or diaphoretic.  HENT:     Head: Normocephalic and atraumatic.     Nose: Nose normal.     Mouth/Throat:     Mouth: Mucous membranes are moist.     Pharynx: Oropharynx is clear.  Eyes:     Extraocular Movements: Extraocular movements intact.     Conjunctiva/sclera: Conjunctivae normal.     Pupils: Pupils are equal, round, and reactive to light.  Cardiovascular:     Rate and Rhythm: Normal rate and regular rhythm.  Pulmonary:     Effort: Pulmonary effort is normal.     Breath sounds: Normal breath sounds. No wheezing.  Abdominal:     General: Abdomen is flat. Bowel  sounds are normal.     Palpations: Abdomen is soft.     Tenderness: There is abdominal tenderness.     Comments: Generalized abdominal tenderness  Musculoskeletal:     Cervical back: Normal range of motion and neck supple. No tenderness.     Right lower leg: No edema.     Left lower leg: No edema.     Comments: No edema to bilateral lower extremities  Skin:    Capillary Refill: Capillary refill takes less than 2 seconds.  Neurological:     Mental Status: He is alert and oriented to person, place, and time.     ED Results / Procedures / Treatments   Labs (all labs ordered are listed, but only abnormal results are displayed) Labs Reviewed  COMPREHENSIVE METABOLIC PANEL - Abnormal; Notable for the following components:      Result Value   Chloride 113 (*)    CO2 14 (*)    BUN 5 (*)    Calcium 7.1 (*)    AST 71 (*)    All other components within normal limits  CBC - Abnormal; Notable for the following components:   RBC 3.96 (*)    Hemoglobin 11.0 (*)    HCT 35.7  (*)    RDW 25.1 (*)    All other components within normal limits  LIPASE, BLOOD  URINALYSIS, ROUTINE W REFLEX MICROSCOPIC  TROPONIN I (HIGH SENSITIVITY)  TROPONIN I (HIGH SENSITIVITY)    EKG None  Radiology DG Chest Portable 1 View Result Date: 05/26/2023 CLINICAL DATA:  Shortness of breath, chest pain. EXAM: PORTABLE CHEST 1 VIEW COMPARISON:  April 05, 2023. FINDINGS: The heart size and mediastinal contours are within normal limits. Both lungs are clear. The visualized skeletal structures are unremarkable. IMPRESSION: No active disease. Electronically Signed   By: Lupita Raider M.D.   On: 05/26/2023 15:14    Procedures Procedures    Medications Ordered in ED Medications  ondansetron (ZOFRAN) injection 4 mg (has no administration in time range)  fentaNYL (SUBLIMAZE) injection 50 mcg (50 mcg Intravenous Given 05/26/23 1502)    ED Course/ Medical Decision Making/ A&P    Medical Decision Making Amount and/or Complexity of Data Reviewed Labs: ordered. Radiology: ordered.  Risk Prescription drug management.   53 year old male presents for evaluation.  Please see HPI for further details.  On examination the patient is afebrile and nontachycardic.  Lung sounds are clear bilaterally, he is not hypoxic.  Abdomen is soft and compressible however there is tenderness located throughout that is generalized and nonspecific.  Neurological examinations at baseline.   Patient CBC without leukocytosis, hemoglobin at baseline.  Metabolic panel shows bicarb 14, creatinine baseline, anion gap 11.  Lipase WNL.  Urinalysis unremarkable.  Will collect troponins, chest x-ray as well as CT scan of the abdomen and pelvis to rule out pancreatitis.  Patient reports that even sometimes with normal lipase levels he can have a "flare" of pancreatitis.  Provided 50 mcg of fentanyl for pain control.  Chest x-ray unremarkable.  EKG is nonischemic.  At this time, patient workup not complete.  Will  sign out to oncoming provider pending CT scan and reassessment of pain.  Plan of management discussed with oncoming provider.  Final Clinical Impression(s) / ED Diagnoses Final diagnoses:  Nausea vomiting and diarrhea  Generalized abdominal pain    Rx / DC Orders ED Discharge Orders     None         Al Decant, PA-C  05/26/23 1527    Margarita Grizzle, MD 06/01/23 1114

## 2023-05-26 NOTE — ED Provider Triage Note (Signed)
Emergency Medicine Provider Triage Evaluation Note  Josealberto Kiyansh Hornecker , a 53 y.o. male  was evaluated in triage.  Pt complains of epigastric abdominal pain, nausea, vomiting, diarrhea.  Patient is known well to this emergency department, seen frequently for pancreatitis..  Review of Systems  Positive: Abdominal pain, chest pain Negative: Of breath  Physical Exam  BP (!) 153/109 (BP Location: Right Arm)   Pulse 80   Temp 98.3 F (36.8 C) (Oral)   Resp 20   Ht 5\' 8"  (1.727 m)   Wt 60.4 kg   SpO2 91%   BMI 20.25 kg/m  Gen:   Awake, no distress   Resp:  Normal effort  MSK:   Moves extremities without difficulty  Other:  Mild ttp of epigastric region, no rebound, rigidity, guarding  Medical Decision Making  Medically screening exam initiated at 7:08 AM.  Appropriate orders placed.  Esgar Jaruis Casselberry was informed that the remainder of the evaluation will be completed by another provider, this initial triage assessment does not replace that evaluation, and the importance of remaining in the ED until their evaluation is complete.  Workup initiated in triage    Olene Floss, [place] 05/26/23 5621

## 2023-05-26 NOTE — ED Provider Notes (Signed)
Patient signed out to myself at shift change by Delice Bison, PA-C, please see their note for complete HPI, ROS, and PE.  Patient pending CT abdomen pelvis and troponin.  Patient presents today for nausea, vomiting, diarrhea and sharp chest pain with shortness of breath since this morning.  Patient does have a history of chronic pancreatitis.  Patient denies any rectal bleeding.  Physical Exam  BP (!) 174/156 (BP Location: Left Arm)   Pulse (!) 58   Temp 97.8 F (36.6 C) (Oral)   Resp 12   Ht 5\' 8"  (1.727 m)   Wt 60.4 kg   SpO2 100%   BMI 20.25 kg/m   Physical Exam Vitals and nursing note reviewed.  Constitutional:      General: He is not in acute distress.    Appearance: He is well-developed. He is not ill-appearing, toxic-appearing or diaphoretic.  HENT:     Head: Normocephalic and atraumatic.  Eyes:     Extraocular Movements: Extraocular movements intact.     Conjunctiva/sclera: Conjunctivae normal.  Cardiovascular:     Rate and Rhythm: Normal rate and regular rhythm.     Heart sounds: Normal heart sounds. No murmur heard. Pulmonary:     Effort: Pulmonary effort is normal. No respiratory distress.     Breath sounds: Normal breath sounds.  Abdominal:     General: Abdomen is flat. Bowel sounds are normal.     Palpations: Abdomen is soft.     Tenderness: There is generalized abdominal tenderness.  Musculoskeletal:        General: No swelling.     Cervical back: Neck supple.  Skin:    General: Skin is warm and dry.     Capillary Refill: Capillary refill takes less than 2 seconds.  Neurological:     General: No focal deficit present.     Mental Status: He is alert.     Motor: No weakness.  Psychiatric:        Mood and Affect: Mood normal.     Procedures  Procedures  ED Course / MDM    Medical Decision Making Amount and/or Complexity of Data Reviewed Labs: ordered. Radiology: ordered.  Risk Prescription drug management.   Patient given additional  dose of fentanyl for pain and GI cocktail for acid reflux.  Patient states he is feeling much better prior to discharge.  CT abdomen pelvis: No acute abnormality, no CT evidence of acute pancreatitis Troponin: 5   Dispo: Considered for admission or further workup however patient was able to tolerate p.o. intake.  Patient advised to cease alcohol consumption and to stay well-hydrated and nourished.  Patient given Zofran for nausea and short course of oxycodone for pain.  Patient also states that he is out of his Protonix and Carafate, will refill these medications as well.  Patient given strict return precautions.        Dolphus Jenny, PA-C 05/26/23 1844    Lonell Grandchild, MD 05/26/23 2041

## 2023-05-26 NOTE — ED Notes (Signed)
Unable to obtain Pt. O2 sat, pt. Cold.

## 2023-05-26 NOTE — Discharge Instructions (Signed)
Today you were seen for abdominal pain with nausea and vomiting.  Please pick up your medications and take as prescribed.  Please follow-up with your PCP if your symptoms persist for further evaluation and treatment.  Thank you for letting us treat you today. After reviewing your labs and imaging, I feel you are safe to go home. Please follow up with your PCP in the next several days and provide them with your records from this visit. Return to the Emergency Room if pain becomes severe or symptoms worsen.

## 2023-06-08 ENCOUNTER — Encounter (HOSPITAL_COMMUNITY): Payer: Self-pay

## 2023-06-08 ENCOUNTER — Emergency Department (HOSPITAL_COMMUNITY): Payer: MEDICAID

## 2023-06-08 ENCOUNTER — Other Ambulatory Visit: Payer: Self-pay | Admitting: Physician Assistant

## 2023-06-08 ENCOUNTER — Ambulatory Visit (HOSPITAL_COMMUNITY)
Admission: RE | Admit: 2023-06-08 | Discharge: 2023-06-08 | Disposition: A | Discharge status: Home / Self Care | Payer: MEDICAID | Source: Ambulatory Visit | Attending: Physician Assistant | Admitting: Physician Assistant

## 2023-06-08 ENCOUNTER — Other Ambulatory Visit: Payer: Self-pay

## 2023-06-08 ENCOUNTER — Emergency Department (HOSPITAL_COMMUNITY)
Admission: EM | Admit: 2023-06-08 | Discharge: 2023-06-09 | Disposition: A | Discharge status: Home / Self Care | Payer: MEDICAID | Attending: Student | Admitting: Student

## 2023-06-08 ENCOUNTER — Telehealth: Payer: Self-pay | Admitting: Gastroenterology

## 2023-06-08 DIAGNOSIS — K863 Pseudocyst of pancreas: Secondary | ICD-10-CM

## 2023-06-08 DIAGNOSIS — R1013 Epigastric pain: Secondary | ICD-10-CM | POA: Diagnosis present

## 2023-06-08 DIAGNOSIS — K852 Alcohol induced acute pancreatitis without necrosis or infection: Secondary | ICD-10-CM

## 2023-06-08 DIAGNOSIS — F17210 Nicotine dependence, cigarettes, uncomplicated: Secondary | ICD-10-CM | POA: Diagnosis not present

## 2023-06-08 DIAGNOSIS — K860 Alcohol-induced chronic pancreatitis: Secondary | ICD-10-CM | POA: Diagnosis present

## 2023-06-08 DIAGNOSIS — J45909 Unspecified asthma, uncomplicated: Secondary | ICD-10-CM | POA: Diagnosis not present

## 2023-06-08 DIAGNOSIS — Z79899 Other long term (current) drug therapy: Secondary | ICD-10-CM | POA: Diagnosis not present

## 2023-06-08 DIAGNOSIS — K862 Cyst of pancreas: Secondary | ICD-10-CM | POA: Diagnosis present

## 2023-06-08 DIAGNOSIS — F1010 Alcohol abuse, uncomplicated: Secondary | ICD-10-CM | POA: Diagnosis not present

## 2023-06-08 DIAGNOSIS — R112 Nausea with vomiting, unspecified: Secondary | ICD-10-CM | POA: Diagnosis not present

## 2023-06-08 DIAGNOSIS — I10 Essential (primary) hypertension: Secondary | ICD-10-CM | POA: Diagnosis not present

## 2023-06-08 DIAGNOSIS — E875 Hyperkalemia: Secondary | ICD-10-CM | POA: Diagnosis not present

## 2023-06-08 DIAGNOSIS — Z91040 Latex allergy status: Secondary | ICD-10-CM | POA: Diagnosis not present

## 2023-06-08 DIAGNOSIS — R1084 Generalized abdominal pain: Secondary | ICD-10-CM | POA: Diagnosis not present

## 2023-06-08 DIAGNOSIS — Z7951 Long term (current) use of inhaled steroids: Secondary | ICD-10-CM | POA: Insufficient documentation

## 2023-06-08 LAB — CBC
HCT: 28.1 % — ABNORMAL LOW (ref 39.0–52.0)
Hemoglobin: 8.9 g/dL — ABNORMAL LOW (ref 13.0–17.0)
MCH: 28.3 pg (ref 26.0–34.0)
MCHC: 31.7 g/dL (ref 30.0–36.0)
MCV: 89.5 fL (ref 80.0–100.0)
Platelets: 134 10*3/uL — ABNORMAL LOW (ref 150–400)
RBC: 3.14 MIL/uL — ABNORMAL LOW (ref 4.22–5.81)
RDW: 23.6 % — ABNORMAL HIGH (ref 11.5–15.5)
WBC: 6.9 10*3/uL (ref 4.0–10.5)
nRBC: 0 % (ref 0.0–0.2)

## 2023-06-08 LAB — COMPREHENSIVE METABOLIC PANEL
ALT: 28 U/L (ref 0–44)
AST: 60 U/L — ABNORMAL HIGH (ref 15–41)
Albumin: 4.1 g/dL (ref 3.5–5.0)
Alkaline Phosphatase: 68 U/L (ref 38–126)
Anion gap: 11 (ref 5–15)
BUN: 14 mg/dL (ref 6–20)
CO2: 16 mmol/L — ABNORMAL LOW (ref 22–32)
Calcium: 7.9 mg/dL — ABNORMAL LOW (ref 8.9–10.3)
Chloride: 109 mmol/L (ref 98–111)
Creatinine, Ser: 0.89 mg/dL (ref 0.61–1.24)
GFR, Estimated: >60 mL/min (ref >60)
Glucose, Bld: 72 mg/dL (ref 70–99)
Potassium: 5.3 mmol/L — ABNORMAL HIGH (ref 3.5–5.1)
Sodium: 136 mmol/L (ref 135–145)
Total Bilirubin: 0.9 mg/dL (ref 0.0–1.2)
Total Protein: 7.9 g/dL (ref 6.5–8.1)

## 2023-06-08 LAB — URINALYSIS, ROUTINE W REFLEX MICROSCOPIC
Bilirubin Urine: NEGATIVE
Glucose, UA: NEGATIVE mg/dL
Hgb urine dipstick: NEGATIVE
Ketones, ur: NEGATIVE mg/dL
Leukocytes,Ua: NEGATIVE
Nitrite: NEGATIVE
Protein, ur: NEGATIVE mg/dL
Specific Gravity, Urine: 1.03 (ref 1.005–1.030)
pH: 5 (ref 5.0–8.0)

## 2023-06-08 LAB — LIPASE, BLOOD: Lipase: 25 U/L (ref 11–51)

## 2023-06-08 MED ORDER — ONDANSETRON 4 MG PO TBDP
4.0000 mg | ORAL_TABLET | Freq: Once | ORAL | Status: AC
  Administered 2023-06-08: 4 mg via ORAL
  Filled 2023-06-08: qty 1

## 2023-06-08 MED ORDER — GADOBUTROL 1 MMOL/ML IV SOLN
6.0000 mL | Freq: Once | INTRAVENOUS | Status: AC | PRN
  Administered 2023-06-08: 6 mL via INTRAVENOUS

## 2023-06-08 MED ORDER — ONDANSETRON HCL 4 MG/2ML IJ SOLN
4.0000 mg | Freq: Once | INTRAMUSCULAR | Status: AC
  Administered 2023-06-08: 4 mg via INTRAVENOUS
  Filled 2023-06-08: qty 2

## 2023-06-08 MED ORDER — ONDANSETRON 8 MG PO TBDP
8.0000 mg | ORAL_TABLET | Freq: Once | ORAL | Status: AC
  Administered 2023-06-09: 8 mg via ORAL
  Filled 2023-06-08: qty 1

## 2023-06-08 MED ORDER — LACTATED RINGERS IV BOLUS
1000.0000 mL | Freq: Once | INTRAVENOUS | Status: AC
  Administered 2023-06-08: 1000 mL via INTRAVENOUS

## 2023-06-08 MED ORDER — FAMOTIDINE IN NACL 20-0.9 MG/50ML-% IV SOLN
20.0000 mg | Freq: Once | INTRAVENOUS | Status: AC
  Administered 2023-06-08: 20 mg via INTRAVENOUS
  Filled 2023-06-08: qty 50

## 2023-06-08 MED ORDER — MORPHINE SULFATE (PF) 4 MG/ML IV SOLN
4.0000 mg | Freq: Once | INTRAVENOUS | Status: AC
  Administered 2023-06-08: 4 mg via INTRAVENOUS
  Filled 2023-06-08: qty 1

## 2023-06-08 MED ORDER — LIDOCAINE VISCOUS HCL 2 % MT SOLN
15.0000 mL | Freq: Once | OROMUCOSAL | Status: AC
  Administered 2023-06-08: 15 mL via ORAL
  Filled 2023-06-08: qty 15

## 2023-06-08 MED ORDER — OXYCODONE-ACETAMINOPHEN 5-325 MG PO TABS
1.0000 | ORAL_TABLET | Freq: Once | ORAL | Status: AC
  Administered 2023-06-08: 1 via ORAL
  Filled 2023-06-08: qty 1

## 2023-06-08 MED ORDER — ACETAMINOPHEN 500 MG PO TABS
1000.0000 mg | ORAL_TABLET | Freq: Once | ORAL | Status: AC
  Administered 2023-06-09: 1000 mg via ORAL
  Filled 2023-06-08: qty 2

## 2023-06-08 MED ORDER — ALUM & MAG HYDROXIDE-SIMETH 200-200-20 MG/5ML PO SUSP
30.0000 mL | Freq: Once | ORAL | Status: AC
  Administered 2023-06-08: 30 mL via ORAL
  Filled 2023-06-08: qty 30

## 2023-06-08 NOTE — ED Notes (Signed)
IV and blood draw attempted without success

## 2023-06-08 NOTE — Telephone Encounter (Signed)
Patient called stated he is in a  lot of pain from his abdomin area, seeking some type of relief. Also stated he has an appt for this morning and is not sure if he can attend due to abdominal pain.

## 2023-06-08 NOTE — ED Provider Notes (Signed)
[place] EMERGENCY DEPARTMENT AT [facility] Provider Note  CSN: [id number] Arrival date & time: 06/08/23 1148  Chief Complaint(s) Abdominal Pain  HPI [name] is a 53 y.o. male with PMH chronic alcohol abuse still drinking, chronic pancreatitis, polysubstance abuse who presents emergency room for evaluation of epigastric pain nausea and vomiting.  States that this feels like his pancreatitis.  Pain worse in the epigastrium.  Denies chest pain, shortness breath, headache, fever or other systemic symptoms.   Past Medical History Past Medical History:  Diagnosis Date   Alcoholism /alcohol abuse    Anemia    Anxiety    Arthritis    knees; arms; elbows (03/26/2015)   Asthma    Bipolar disorder (HCC)    Chronic bronchitis (HCC)    Chronic lower back pain    Chronic pancreatitis (HCC)    Cocaine abuse (HCC)    Depression    Family history of adverse reaction to anesthesia    Femoral condyle fracture (HCC) 03/08/2014   left medial/notes 03/09/2014   GERD (gastroesophageal reflux disease)    H/O hiatal hernia    H/O suicide attempt 10/2012   High cholesterol    History of blood transfusion 10/2012   when I tried to commit suicide   History of stomach ulcers    Hypertension    Marijuana abuse, continuous    Migraine    a few times/year (03/26/2015)   Pancreatitis    Pneumonia 1990's X 3   PTSD (post-traumatic stress disorder)    Seizures (HCC)    Sickle cell trait (HCC)    WPW (Wolff-Parkinson-White syndrome)    [name] 03/06/2013   Patient Active Problem List   Diagnosis Date Noted   History of peptic ulcer 03/07/2023   Lumbosacral radiculopathy 03/07/2023   Duodenitis 03/06/2023   Gastric AVM 03/06/2023   AVM (arteriovenous malformation) of small bowel, 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   Hyperkalemia 03/03/2023   Acute urinary retention 03/01/2023   Intractable pain 02/09/2023   Metabolic acidosis, increased anion gap 02/08/2023   Cocaine abuse (HCC)  02/08/2023   Adynamic ileus (HCC) 01/25/2023   Cannabinoid hyperemesis syndrome 01/25/2023   Epigastric abdominal pain 01/09/2023   Asthma, chronic 01/09/2023   Ileus (HCC) 01/09/2023   Hypokalemia 09/18/2022   NSVT (nonsustained ventricular tachycardia) (HCC) 01/20/2022   Normal anion gap metabolic acidosis 12/08/2021   Hypoalbuminemia 12/08/2021   Acute recurrent pancreatitis 12/07/2021   WPW (Wolff-Parkinson-White syndrome) 12/06/2021   Paroxysmal atrial fibrillation (HCC) 12/06/2021   Pulmonary nodule 12/06/2021   Leukocytosis 06/07/2021   Epigastric pain 06/07/2021   Acute alcoholic pancreatitis 06/06/2021   Urinary retention    Protein-calorie malnutrition, severe 11/17/2020   Acute pancreatitis 11/15/2020   Hypomagnesemia    Hypocalcemia 10/23/2020   AKI (acute kidney injury) (HCC) 11/13/2018   Seizure (HCC) 11/13/2018   Chest pain 01/08/2018   Acute on chronic pancreatitis (HCC) 09/28/2017   Abdominal pain 05/27/2017   Tachycardia 03/18/2017   Nausea, vomiting, and diarrhea 03/18/2017   Diarrhea 03/18/2017   GERD (gastroesophageal reflux disease)    Alcohol use disorder 12/05/2016   Normocytic anemia 12/05/2016   Alcohol use disorder, severe, dependence (HCC) 07/25/2016   Cocaine use disorder, severe, dependence (HCC) 07/25/2016   Major depressive disorder, 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 Authorizing Provider  oxyCODONE-acetaminophen (PERCOCET) 5-325 MG tablet Take 1-2 tablets by mouth every 4 (four)  hours as needed. 06/09/23  Yes Pollina, Canary Brim, MD  acetaminophen (TYLENOL) 650 MG CR tablet Take 1,300 mg by mouth every 8 (eight) hours as needed for pain.    [provider]  bismuth subsalicylate (KAOPECTATE) 262 MG/15ML suspension Take 15 mLs by mouth every 6 (six) hours as needed for indigestion or diarrhea or loose stools.    [provider]  diltiazem (TIAZAC) 120 MG 24 hr capsule Take 1 capsule by mouth in the morning and at bedtime. Patient not taking: Reported on 05/26/2023 04/13/23 07/12/23  [provider]  fluticasone (FLONASE) 50 MCG/ACT nasal spray Place 2 sprays into both nostrils daily as needed for allergies.    [provider]  folic acid (FOLVITE) 1 MG tablet Take 1 tablet (1 mg total) by mouth daily. 09/21/22   Pokhrel, Rebekah Chesterfield, MD  hydrocortisone cream 1 % Apply topically as needed for itching. Patient not taking: Reported on 05/01/2023 03/07/23   Rodolph Bong, MD  lipase/protease/amylase (CREON) 36000 UNITS CPEP capsule Take 2 capsules (72,000 Units total) by mouth 3 (three) times daily with meals. May also take 1 capsule (36,000 Units total) as needed (with snacks). 01/26/23   Jerald Kief, MD  liver oil-zinc oxide (DESITIN) 40 % ointment Apply topically 2 (two) times daily. Apply to buttocks Patient not taking: Reported on 03/11/2023 03/07/23   Rodolph Bong, MD  loperamide (IMODIUM) 2 MG capsule Take 1 capsule (2 mg total) by mouth as needed for diarrhea or loose stools. Patient not taking: Reported on 05/26/2023 03/07/23   Rodolph Bong, MD  magnesium oxide (MAG-OX) 400 (240 Mg) MG tablet Take 400 mg by mouth daily. 04/07/23   [provider]  metoprolol (TOPROL-XL) 200 MG 24 hr tablet Take 1 tablet (200 mg total) by mouth daily. 03/07/23   Rodolph Bong, MD  nitroGLYCERIN (NITROSTAT) 0.4 MG SL tablet Place 0.4 mg under the tongue every 5 (five) minutes as needed for chest pain.    [provider]   ondansetron (ZOFRAN) 4 MG tablet Take 1 tablet (4 mg total) by mouth every 8 (eight) hours as needed for nausea or vomiting. 05/26/23   Dolphus Jenny, PA-C  pantoprazole (PROTONIX) 40 MG tablet Take 1 tablet (40 mg total) by mouth 2 (two) times daily. 05/26/23   Dolphus Jenny, PA-C  pregabalin (LYRICA) 100 MG capsule Take 100 mg by mouth 2 (two) times daily. Patient not taking: Reported on 05/26/2023 02/26/23   [provider]  Simethicone (GAS-X PO) Take 1 capsule by mouth 2 (two) times daily as needed (gas).    [provider]  sucralfate (CARAFATE) 1 GM/10ML suspension Take 10 mLs (1 g total) by mouth 4 (four) times daily -  with meals and at bedtime. 05/26/23   Dolphus Jenny, PA-C  tamsulosin (FLOMAX) 0.4 MG CAPS capsule Take 1 capsule (0.4 mg total) by mouth daily. Patient not taking: Reported on 05/26/2023 03/08/23   Rodolph Bong, MD  amitriptyline (ELAVIL) 25 MG tablet Take 1 tablet (25 mg total) by mouth at bedtime. Patient not taking: Reported on 08/08/2019 10/15/18 08/08/19  Rolly Salter, MD  Past Surgical History Past Surgical History:  Procedure Laterality Date   BIOPSY  11/25/2017   Procedure: BIOPSY;  Surgeon: [name] Modena, MD;  Location: [facility] ENDOSCOPY;  Service: Endoscopy;;   BIOPSY  10/14/2018   Procedure: BIOPSY;  Surgeon: [name] Modena, MD;  Location: [facility] ENDOSCOPY;  Service: Endoscopy;;   BIOPSY  03/06/2023   Procedure: BIOPSY;  Surgeon: Lemar Lofty., MD;  Location: [facility] ENDOSCOPY;  Service: Gastroenterology;;   CARDIAC CATHETERIZATION     CYST ENTEROSTOMY  01/02/2020   Procedure: CYST ASPIRATION;  Surgeon: Rachael Fee, MD;  Location: [facility] ENDOSCOPY;  Service: Endoscopy;;   ESOPHAGOGASTRODUODENOSCOPY N/A 03/06/2023   Procedure: ESOPHAGOGASTRODUODENOSCOPY (EGD);  Surgeon: Lemar Lofty., MD;  Location: [facility]  ENDOSCOPY;  Service: Gastroenterology;  Laterality: N/A;   ESOPHAGOGASTRODUODENOSCOPY (EGD) WITH PROPOFOL N/A 11/25/2017   Procedure: ESOPHAGOGASTRODUODENOSCOPY (EGD) WITH PROPOFOL;  Surgeon: [name] Modena, MD;  Location: [facility] ENDOSCOPY;  Service: Endoscopy;  Laterality: N/A;   ESOPHAGOGASTRODUODENOSCOPY (EGD) WITH PROPOFOL Left 10/14/2018   Procedure: ESOPHAGOGASTRODUODENOSCOPY (EGD) WITH PROPOFOL;  Surgeon: [name] Modena, MD;  Location: [facility] ENDOSCOPY;  Service: Endoscopy;  Laterality: Left;   ESOPHAGOGASTRODUODENOSCOPY (EGD) WITH PROPOFOL N/A 11/14/2018   Procedure: ESOPHAGOGASTRODUODENOSCOPY (EGD) WITH PROPOFOL;  Surgeon: Carman Ching, MD;  Location: [facility] ENDOSCOPY;  Service: Gastroenterology;  Laterality: N/A;   ESOPHAGOGASTRODUODENOSCOPY (EGD) WITH PROPOFOL N/A 01/02/2020   Procedure: ESOPHAGOGASTRODUODENOSCOPY (EGD) WITH PROPOFOL;  Surgeon: Rachael Fee, MD;  Location: [facility] ENDOSCOPY;  Service: Endoscopy;  Laterality: N/A;   ESOPHAGOGASTRODUODENOSCOPY (EGD) WITH PROPOFOL N/A 10/25/2020   Procedure: ESOPHAGOGASTRODUODENOSCOPY (EGD) WITH PROPOFOL;  Surgeon: Meridee Score Netty Starring., MD;  Location: [facility] ENDOSCOPY;  Service: Gastroenterology;  Laterality: N/A;   EUS N/A 01/02/2020   Procedure: UPPER ENDOSCOPIC ULTRASOUND (EUS) RADIAL;  Surgeon: Rachael Fee, MD;  Location: [facility] ENDOSCOPY;  Service: Endoscopy;  Laterality: N/A;   EUS N/A 03/06/2023   Procedure: UPPER ENDOSCOPIC ULTRASOUND (EUS) RADIAL;  Surgeon: Lemar Lofty., MD;  Location: [facility] ENDOSCOPY;  Service: Gastroenterology;  Laterality: N/A;   EYE SURGERY Left 1990's   "result of trauma"    FACIAL FRACTURE SURGERY Left 1990's   "result of trauma"    FINE NEEDLE ASPIRATION N/A 03/06/2023   Procedure: FINE NEEDLE ASPIRATION (FNA) LINEAR;  Surgeon: Lemar Lofty., MD;  Location: [facility] ENDOSCOPY;  Service: Gastroenterology;  Laterality: N/A;   FLEXIBLE SIGMOIDOSCOPY N/A 10/25/2020   Procedure: FLEXIBLE SIGMOIDOSCOPY;  Surgeon:  Meridee Score Netty Starring., MD;  Location: [facility] ENDOSCOPY;  Service: Gastroenterology;  Laterality: N/A;   FRACTURE SURGERY     HEMOSTASIS CLIP PLACEMENT  10/25/2020   Procedure: HEMOSTASIS CLIP PLACEMENT;  Surgeon: Lemar Lofty., MD;  Location: [facility] ENDOSCOPY;  Service: Gastroenterology;;   HERNIA REPAIR     HOT HEMOSTASIS N/A 03/06/2023   Procedure: HOT HEMOSTASIS (ARGON PLASMA COAGULATION/BICAP);  Surgeon: Lemar Lofty., MD;  Location: [facility] ENDOSCOPY;  Service: Gastroenterology;  Laterality: N/A;   LEFT HEART CATHETERIZATION WITH CORONARY ANGIOGRAM Right 03/07/2013   Procedure: LEFT HEART CATHETERIZATION WITH CORONARY ANGIOGRAM;  Surgeon: Ricki Rodriguez, MD;  Location: [facility] CATH LAB;  Service: Cardiovascular;  Laterality: Right;   UMBILICAL HERNIA REPAIR     UPPER GASTROINTESTINAL ENDOSCOPY     Family History Family History  Problem Relation Age of Onset   Hypertension Mother    Cirrhosis Father    Alcoholism Father    Hypertension Father    Melanoma Father    Hypertension Other    Coronary artery disease Other     Social History Social History  Tobacco Use   Smoking status: Every Day    Current packs/day: 1.00    Average packs/day: 1 pack/day for 36.0 years (36.0 ttl pk-yrs)    Types: Cigarettes, E-cigarettes   Smokeless tobacco: Never  Vaping Use   Vaping status: Former  Substance Use Topics   Alcohol use: Not Currently    Alcohol/week: 4.0 standard drinks of alcohol    Types: 4 Cans of beer per week   Drug use: Not Currently    Types: Marijuana, Cocaine   Allergies Robaxin [methocarbamol], Aspirin, Shellfish-derived products, Trazodone and nefazodone, Adhesive [tape], Fish-derived products, Latex, Toradol [ketorolac tromethamine], Contrast media [iodinated contrast media], Reglan [metoclopramide], and Salmon [fish oil]  Review of Systems Review of Systems  Gastrointestinal:  Positive for abdominal pain, nausea and vomiting.    Physical Exam Vital  Signs  I have reviewed the triage vital signs BP (!) 178/107 (BP Location: Left Arm) Comment: MD Aware, asymptommatic  Pulse 90   Temp 98.3 F (36.8 C) (Oral)   Resp 18   Ht 5\' 8"  (1.727 m)   Wt 56.7 kg   SpO2 100%   BMI 19.01 kg/m   Physical Exam Constitutional:      General: He is not in acute distress.    Appearance: Normal appearance.  HENT:     Head: Normocephalic and atraumatic.     Nose: No congestion or rhinorrhea.  Eyes:     General:        Right eye: No discharge.        Left eye: No discharge.     Extraocular Movements: Extraocular movements intact.     Pupils: Pupils are equal, round, and reactive to light.  Cardiovascular:     Rate and Rhythm: Normal rate and regular rhythm.     Heart sounds: No murmur heard. Pulmonary:     Effort: No respiratory distress.     Breath sounds: No wheezing or rales.  Abdominal:     General: There is no distension.     Tenderness: There is abdominal tenderness in the epigastric area.  Musculoskeletal:        General: Normal range of motion.     Cervical back: Normal range of motion.  Skin:    General: Skin is warm and dry.  Neurological:     General: No focal deficit present.     Mental Status: He is alert.     ED Results and Treatments Labs (all labs ordered are listed, but only abnormal results are displayed) Labs Reviewed  COMPREHENSIVE METABOLIC PANEL - Abnormal; Notable for the following components:      Result Value   Potassium 5.3 (*)    CO2 16 (*)    Calcium 7.9 (*)    AST 60 (*)    All other components within normal limits  CBC - Abnormal; Notable for the following components:   RBC 3.14 (*)    Hemoglobin 8.9 (*)    HCT 28.1 (*)    RDW 23.6 (*)    Platelets 134 (*)    All other components within normal limits  LIPASE, BLOOD  URINALYSIS, ROUTINE W REFLEX MICROSCOPIC  Radiology CT  ABDOMEN PELVIS WO CONTRAST Result Date: 06/08/2023 CLINICAL DATA:  Epigastric pain EXAM: CT ABDOMEN AND PELVIS WITHOUT CONTRAST TECHNIQUE: Multidetector CT imaging of the abdomen and pelvis was performed following the standard protocol without IV contrast. RADIATION DOSE REDUCTION: This exam was performed according to the departmental dose-optimization program which includes automated exposure control, adjustment of the mA and/or kV according to patient size and/or use of iterative reconstruction technique. COMPARISON:  CT abdomen and pelvis 05/26/2023. FINDINGS: Lower chest: No acute abnormality. Hepatobiliary: No focal liver abnormality is seen. No gallstones, gallbladder wall thickening, or biliary dilatation. Pancreas: Pancreatic calcifications are again seen compatible with chronic pancreatitis. No acute inflammation identified. Three cystic areas are again noted in the pancreas, unchanged from prior. The largest is in the body measuring 4.0 by 3.3 cm. Spleen: Normal in size without focal abnormality. Adrenals/Urinary Tract: Mild diffuse bladder wall thickening present. Kidneys and adrenal glands appear within normal limits. Stomach/Bowel: There are findings suspicious for diffuse gastric wall thickening, particularly along the greater curvature. The stomach is nondilated. Appendix appears normal. No evidence of bowel wall thickening, distention, or inflammatory changes. There are scattered air-fluid levels throughout small bowel loops. Vascular/Lymphatic: Aortic atherosclerosis. No enlarged abdominal or pelvic lymph nodes. Reproductive: Prostate is unremarkable. Other: No abdominal wall hernia or abnormality. No abdominopelvic ascites. Musculoskeletal: There are degenerative changes at L5-S1. IMPRESSION: 1. Findings suspicious for diffuse gastric wall thickening, particularly along the greater curvature. Findings may represent gastritis. 2. Scattered air-fluid levels throughout small bowel loops, nonspecific  but can be seen in the setting of enteritis. 3. Stable cystic areas in the pancreas. Findings may represent pseudocysts or IPMNs. 4. Mild diffuse bladder wall thickening. Correlate clinically for cystitis. 5. Aortic atherosclerosis. Aortic Atherosclerosis (ICD10-I70.0). Electronically Signed   By: Darliss Cheney M.D.   On: 06/08/2023 21:12    Pertinent labs & imaging results that were available during my care of the patient were reviewed by me and considered in my medical decision making (see MDM for details).  Medications Ordered in ED Medications  oxyCODONE-acetaminophen (PERCOCET/ROXICET) 5-325 MG per tablet 1 tablet (1 tablet Oral Given 06/08/23 1256)  ondansetron (ZOFRAN-ODT) disintegrating tablet 4 mg (4 mg Oral Given 06/08/23 1256)  morphine (PF) 4 MG/ML injection 4 mg (4 mg Intravenous Given 06/08/23 1856)  ondansetron (ZOFRAN) injection 4 mg (4 mg Intravenous Given 06/08/23 1856)  lactated ringers bolus 1,000 mL (0 mLs Intravenous Stopped 06/08/23 2333)  alum & mag hydroxide-simeth (MAALOX/MYLANTA) 200-200-20 MG/5ML suspension 30 mL (30 mLs Oral Given 06/08/23 2032)    And  lidocaine (XYLOCAINE) 2 % viscous mouth solution 15 mL (15 mLs Oral Given 06/08/23 2033)  morphine (PF) 4 MG/ML injection 4 mg (4 mg Intravenous Given 06/08/23 2034)  famotidine (PEPCID) IVPB 20 mg premix (0 mg Intravenous Stopped 06/08/23 2333)  acetaminophen (TYLENOL) tablet 1,000 mg (1,000 mg Oral Given 06/09/23 0001)  ondansetron (ZOFRAN-ODT) disintegrating tablet 8 mg (8 mg Oral Given 06/09/23 0002)  Procedures Procedures  (including critical care time)  Medical Decision Making / ED Course   This patient presents to the ED for concern of abdominal pain, this involves an extensive number of treatment options, and is a complaint that carries with it a high risk of complications and  morbidity.  The differential diagnosis includes GERD/gastritis, peptic ulcer disease, pancreatitis, gastroparesis, pneumonia, pleurisy, pericarditis  MDM: Patient seen emergency room for evaluation of epigastric abdominal pain.  Laboratory evaluation largely unremarkable outside of a mild hyperkalemia to 5.3, CO2 16, hemoglobin 8.9 which is a mild decrease for the patient.  Patient has not had any dark stools or hematemesis.  Lipase is normal.  Urinalysis unremarkable.  CT abdomen pelvis with evidence of gastritis.  Patient given a GI cocktail, multiple doses of morphine and Pepcid and on reevaluation patient is pending p.o. challenge and reevaluation by oncoming provider.  Please see provider signout note for continuation of workup.   Additional history obtained:  -External records from outside source obtained and reviewed including: Chart review including previous notes, labs, imaging, consultation notes   Lab Tests: -I ordered, reviewed, and interpreted labs.   The pertinent results include:   Labs Reviewed  COMPREHENSIVE METABOLIC PANEL - Abnormal; Notable for the following components:      Result Value   Potassium 5.3 (*)    CO2 16 (*)    Calcium 7.9 (*)    AST 60 (*)    All other components within normal limits  CBC - Abnormal; Notable for the following components:   RBC 3.14 (*)    Hemoglobin 8.9 (*)    HCT 28.1 (*)    RDW 23.6 (*)    Platelets 134 (*)    All other components within normal limits  LIPASE, BLOOD  URINALYSIS, ROUTINE W REFLEX MICROSCOPIC      Imaging Studies ordered: I ordered imaging studies including CTAP I independently visualized and interpreted imaging. I agree with the radiologist interpretation   Medicines ordered and prescription drug management: Meds ordered this encounter  Medications   oxyCODONE-acetaminophen (PERCOCET/ROXICET) 5-325 MG per tablet 1 tablet    Refill:  0   ondansetron (ZOFRAN-ODT) disintegrating tablet 4 mg   morphine (PF)  4 MG/ML injection 4 mg   ondansetron (ZOFRAN) injection 4 mg   lactated ringers bolus 1,000 mL   AND Linked Order Group    alum & mag hydroxide-simeth (MAALOX/MYLANTA) 200-200-20 MG/5ML suspension 30 mL    lidocaine (XYLOCAINE) 2 % viscous mouth solution 15 mL   morphine (PF) 4 MG/ML injection 4 mg   famotidine (PEPCID) IVPB 20 mg premix   acetaminophen (TYLENOL) tablet 1,000 mg   ondansetron (ZOFRAN-ODT) disintegrating tablet 8 mg   oxyCODONE-acetaminophen (PERCOCET) 5-325 MG tablet    Sig: Take 1-2 tablets by mouth every 4 (four) hours as needed.    Dispense:  10 tablet    Refill:  0    -I have reviewed the patients home medicines and have made adjustments as needed  Critical interventions none   Social Determinants of Health:  Factors impacting patients care include: Persistent alcohol use, encouraged cessation   Reevaluation: After the interventions noted above, I reevaluated the patient and found that they have :improved  Co morbidities that complicate the patient evaluation  Past Medical History:  Diagnosis Date   Alcoholism /alcohol abuse    Anemia    Anxiety    Arthritis    knees; arms; elbows (03/26/2015)   Asthma    Bipolar disorder (HCC)  Chronic bronchitis (HCC)    Chronic lower back pain    Chronic pancreatitis (HCC)    Cocaine abuse (HCC)    Depression    Family history of adverse reaction to anesthesia    Femoral condyle fracture (HCC) 03/08/2014   left medial/notes 03/09/2014   GERD (gastroesophageal reflux disease)    H/O hiatal hernia    H/O suicide attempt 10/2012   High cholesterol    History of blood transfusion 10/2012   when I tried to commit suicide   History of stomach ulcers    Hypertension    Marijuana abuse, continuous    Migraine    a few times/year (03/26/2015)   Pancreatitis    Pneumonia 1990's X 3   PTSD (post-traumatic stress disorder)    Seizures (HCC)    Sickle cell trait (HCC)    WPW (Wolff-Parkinson-White syndrome)     [name] 03/06/2013      Dispostion: I considered admission for this patient, and disposition pending p.o. challenge and reevaluation by oncoming provider.  Please see provider signout note for continuation of workup     Final Clinical Impression(s) / ED Diagnoses Final diagnoses:  Generalized abdominal pain     @PCDICTATION @    Minna Dumire, Wyn Forster, MD 06/09/23 1143

## 2023-06-08 NOTE — Telephone Encounter (Signed)
Pt showed up in lobby wanting [name] to prescribe him some pain medication. In epic it looks like hs has been seen by [facility] and possible pain clinic. Pt was having an MR/MRCP done today that was not ordered by our office. Please advise.

## 2023-06-08 NOTE — Telephone Encounter (Signed)
Pt went to the ER today for abd pain. No medications sent in for pt as he is in the ER. Amanda notified.

## 2023-06-08 NOTE — ED Notes (Signed)
Pt stuck 2x, unable to obtain bloodwork

## 2023-06-08 NOTE — Telephone Encounter (Signed)
This is a Dr Adela Lank pt that Dr Meridee Score only did an EUS on. He was last seen in office by Quentin Mulling will send to her nurse

## 2023-06-08 NOTE — Discharge Instructions (Addendum)
Your stomach hurts because it is inflamed from drinking alcohol.

## 2023-06-08 NOTE — Telephone Encounter (Signed)
Patient came in stating that he is in severe pain and is wanting Marchelle Folks to send in an rx for lyrica and oxycodone. He states that Quentin Mulling has prescribed this to him before?

## 2023-06-08 NOTE — ED Triage Notes (Signed)
Patient has generalized abdominal pain since 3am today. Stated he has pancreatitis. Took oxycodone this morning but vomited it up. Feels nauseous.

## 2023-06-08 NOTE — ED Provider Triage Note (Signed)
Emergency Medicine Provider Triage Evaluation Note  [name] , a 53 y.o. male  was evaluated in triage.  Pt complains of abdominal pain.  Endorsing pain primarily to the epigastrium with some radiation towards the right upper quadrant.  States that he has been having notable nausea with vomiting for several hours now without improvement in symptoms. Denies hematemesis, hematochezia, or melanotic stool  Review of Systems  Positive: As above Negative: As above  Physical Exam  BP (!) 165/104   Pulse 65   Temp 98.5 F (36.9 C) (Oral)   Resp 16   Ht 5\' 8"  (1.727 m)   Wt 56.7 kg   SpO2 96%   BMI 19.01 kg/m  Gen:   Awake, no distress   Resp:  Normal effort  MSK:   Moves extremities without difficulty  Other:  Normal bowel sounds, tenderness to palpation in epigastrium  Medical Decision Making  Medically screening exam initiated at 12:42 PM.  Appropriate orders placed.  [name] was informed that the remainder of the evaluation will be completed by another provider, this initial triage assessment does not replace that evaluation, and the importance of remaining in the ED until their evaluation is complete.    Smitty Knudsen, PA-C 06/08/23 1244

## 2023-06-09 MED ORDER — OXYCODONE-ACETAMINOPHEN 5-325 MG PO TABS: 1.0000 | ORAL_TABLET | ORAL | 0 refills | Status: DC | PRN

## 2023-06-14 ENCOUNTER — Encounter (HOSPITAL_COMMUNITY): Payer: Self-pay

## 2023-06-14 ENCOUNTER — Emergency Department (HOSPITAL_COMMUNITY)
Admission: EM | Admit: 2023-06-14 | Discharge: 2023-06-15 | Disposition: A | Discharge status: Home / Self Care | Payer: MEDICAID | Attending: Emergency Medicine | Admitting: Emergency Medicine

## 2023-06-14 ENCOUNTER — Emergency Department (HOSPITAL_COMMUNITY): Payer: MEDICAID

## 2023-06-14 ENCOUNTER — Other Ambulatory Visit: Payer: Self-pay

## 2023-06-14 DIAGNOSIS — R197 Diarrhea, unspecified: Secondary | ICD-10-CM | POA: Diagnosis not present

## 2023-06-14 DIAGNOSIS — R079 Chest pain, unspecified: Secondary | ICD-10-CM | POA: Insufficient documentation

## 2023-06-14 DIAGNOSIS — F17200 Nicotine dependence, unspecified, uncomplicated: Secondary | ICD-10-CM | POA: Insufficient documentation

## 2023-06-14 DIAGNOSIS — R112 Nausea with vomiting, unspecified: Secondary | ICD-10-CM | POA: Diagnosis not present

## 2023-06-14 DIAGNOSIS — R109 Unspecified abdominal pain: Secondary | ICD-10-CM | POA: Diagnosis present

## 2023-06-14 DIAGNOSIS — I10 Essential (primary) hypertension: Secondary | ICD-10-CM | POA: Insufficient documentation

## 2023-06-14 DIAGNOSIS — Z91040 Latex allergy status: Secondary | ICD-10-CM | POA: Insufficient documentation

## 2023-06-14 DIAGNOSIS — R1084 Generalized abdominal pain: Secondary | ICD-10-CM

## 2023-06-14 DIAGNOSIS — R1013 Epigastric pain: Secondary | ICD-10-CM | POA: Diagnosis not present

## 2023-06-14 LAB — COMPREHENSIVE METABOLIC PANEL
ALT: 26 U/L (ref 0–44)
AST: 55 U/L — ABNORMAL HIGH (ref 15–41)
Albumin: 3.5 g/dL (ref 3.5–5.0)
Alkaline Phosphatase: 87 U/L (ref 38–126)
Anion gap: 14 (ref 5–15)
BUN: 11 mg/dL (ref 6–20)
CO2: 14 mmol/L — ABNORMAL LOW (ref 22–32)
Calcium: 7.8 mg/dL — ABNORMAL LOW (ref 8.9–10.3)
Chloride: 111 mmol/L (ref 98–111)
Creatinine, Ser: 0.91 mg/dL (ref 0.61–1.24)
GFR, Estimated: >60 mL/min (ref >60)
Glucose, Bld: 87 mg/dL (ref 70–99)
Potassium: 4.3 mmol/L (ref 3.5–5.1)
Sodium: 139 mmol/L (ref 135–145)
Total Bilirubin: 0.7 mg/dL (ref 0.0–1.2)
Total Protein: 7.3 g/dL (ref 6.5–8.1)

## 2023-06-14 LAB — URINALYSIS, ROUTINE W REFLEX MICROSCOPIC
Bilirubin Urine: NEGATIVE
Glucose, UA: NEGATIVE mg/dL
Hgb urine dipstick: NEGATIVE
Ketones, ur: NEGATIVE mg/dL
Leukocytes,Ua: NEGATIVE
Nitrite: NEGATIVE
Protein, ur: NEGATIVE mg/dL
Specific Gravity, Urine: 1.019 (ref 1.005–1.030)
pH: 5 (ref 5.0–8.0)

## 2023-06-14 LAB — CBC WITH DIFFERENTIAL/PLATELET
Abs Immature Granulocytes: 0.02 10*3/uL (ref 0.00–0.07)
Basophils Absolute: 0 10*3/uL (ref 0.0–0.1)
Basophils Relative: 0 %
Eosinophils Absolute: 0.1 10*3/uL (ref 0.0–0.5)
Eosinophils Relative: 2 %
HCT: 35.7 % — ABNORMAL LOW (ref 39.0–52.0)
Hemoglobin: 11.4 g/dL — ABNORMAL LOW (ref 13.0–17.0)
Immature Granulocytes: 0 %
Lymphocytes Relative: 49 %
Lymphs Abs: 2.8 10*3/uL (ref 0.7–4.0)
MCH: 28 pg (ref 26.0–34.0)
MCHC: 31.9 g/dL (ref 30.0–36.0)
MCV: 87.7 fL (ref 80.0–100.0)
Monocytes Absolute: 0.9 10*3/uL (ref 0.1–1.0)
Monocytes Relative: 15 %
Neutro Abs: 1.9 10*3/uL (ref 1.7–7.7)
Neutrophils Relative %: 34 %
Platelets: 182 10*3/uL (ref 150–400)
RBC: 4.07 MIL/uL — ABNORMAL LOW (ref 4.22–5.81)
RDW: 22.5 % — ABNORMAL HIGH (ref 11.5–15.5)
Smear Review: NORMAL
WBC: 5.7 10*3/uL (ref 4.0–10.5)
nRBC: 0 % (ref 0.0–0.2)

## 2023-06-14 LAB — TROPONIN I (HIGH SENSITIVITY): Troponin I (High Sensitivity): 9 ng/L (ref <18)

## 2023-06-14 LAB — LIPASE, BLOOD: Lipase: 18 U/L (ref 11–51)

## 2023-06-14 MED ORDER — ONDANSETRON HCL 4 MG/2ML IJ SOLN
4.0000 mg | Freq: Once | INTRAMUSCULAR | Status: AC
  Administered 2023-06-14: 4 mg via INTRAVENOUS
  Filled 2023-06-14: qty 2

## 2023-06-14 MED ORDER — LIDOCAINE VISCOUS HCL 2 % MT SOLN
15.0000 mL | Freq: Once | OROMUCOSAL | Status: AC
  Administered 2023-06-14: 15 mL via OROMUCOSAL
  Filled 2023-06-14: qty 15

## 2023-06-14 MED ORDER — ALUM & MAG HYDROXIDE-SIMETH 200-200-20 MG/5ML PO SUSP
30.0000 mL | Freq: Once | ORAL | Status: AC
  Administered 2023-06-14: 30 mL via ORAL
  Filled 2023-06-14: qty 30

## 2023-06-14 MED ORDER — MORPHINE SULFATE (PF) 2 MG/ML IV SOLN
2.0000 mg | Freq: Once | INTRAVENOUS | Status: AC
  Administered 2023-06-14: 2 mg via INTRAVENOUS
  Filled 2023-06-14: qty 1

## 2023-06-14 MED ORDER — LACTATED RINGERS IV BOLUS
1000.0000 mL | Freq: Once | INTRAVENOUS | Status: AC
  Administered 2023-06-14: 1000 mL via INTRAVENOUS

## 2023-06-14 MED ORDER — FAMOTIDINE IN NACL 20-0.9 MG/50ML-% IV SOLN
20.0000 mg | Freq: Once | INTRAVENOUS | Status: AC
  Administered 2023-06-14: 20 mg via INTRAVENOUS
  Filled 2023-06-14: qty 50

## 2023-06-14 NOTE — ED Triage Notes (Signed)
 Pt BIB [facility] from home. Pt endorses chest pain that radiated to his back and abdominal pain. Pt also reports n/d. Hx of tachyarrhythmias and chronic pancreatitis. EMS reports pt just started a pain clinic. Upon EMS arrival pt was diaphoretic. EMS reports pt took Cialis around 4pm today.    EMS 18 LFA 700 NS 4mg  Zofran   100 mg Fent 140-150 P 140/90 BP 108 CBG

## 2023-06-15 ENCOUNTER — Emergency Department (HOSPITAL_COMMUNITY): Payer: MEDICAID

## 2023-06-15 LAB — TROPONIN I (HIGH SENSITIVITY): Troponin I (High Sensitivity): 8 ng/L (ref <18)

## 2023-06-15 MED ORDER — SODIUM CHLORIDE 0.9 % IV SOLN: 12.5000 mg | Freq: Once | INTRAVENOUS | Status: DC

## 2023-06-15 MED ORDER — HYDROCODONE-ACETAMINOPHEN 5-325 MG PO TABS
1.0000 | ORAL_TABLET | Freq: Once | ORAL | Status: AC
  Administered 2023-06-15: 1 via ORAL
  Filled 2023-06-15: qty 1

## 2023-06-15 MED ORDER — MORPHINE SULFATE (PF) 2 MG/ML IV SOLN
2.0000 mg | Freq: Once | INTRAVENOUS | Status: AC
  Administered 2023-06-15: 2 mg via INTRAVENOUS
  Filled 2023-06-15: qty 1

## 2023-06-15 MED ORDER — DROPERIDOL 2.5 MG/ML IJ SOLN
1.2500 mg | Freq: Once | INTRAMUSCULAR | Status: AC
  Administered 2023-06-15: 1.25 mg via INTRAVENOUS
  Filled 2023-06-15: qty 2

## 2023-06-15 NOTE — Discharge Instructions (Addendum)
 Your workup tonight was reassuring. Please schedule follow up appointments with your pain management provider and gastroenterologist. Return to the emergency department if you develop any life threatening symptoms.

## 2023-06-15 NOTE — ED Notes (Signed)
 Patient transported to CT

## 2023-06-15 NOTE — ED Provider Notes (Signed)
 [place] [facility]PARTMENT AT [facility] Provider Note   CSN: [id number] Arrival date & time: 06/14/23  2154     History  Chief Complaint  Patient presents with   Chest Pain   Abdominal Pain    [name] is a 53 y.o. male.  Patient with past medical history significant for chronic pancreatitis, alcohol  abuse, WPW, hypertension, chest pain presents to the emergency department via EMS complaining of of chest pain and abdominal pain.  He also endorses nausea, vomiting, diarrhea.  The patient states he continues to drink alcohol  but thinks he may be having a pancreatitis flare because he began taking amoxicillin .  He denies shortness of breath, urinary symptoms, fever.   Chest Pain Associated symptoms: abdominal pain   Abdominal Pain Associated symptoms: chest pain        Home Medications Prior to Admission medications   Medication Sig Start Date End Date Taking? Authorizing Provider  amoxicillin  (AMOXIL ) 875 MG tablet Take 875 mg by mouth 2 (two) times daily. 06/09/23  Yes [provider]  bismuth  subsalicylate (KAOPECTATE) 262 MG/15ML suspension Take 15 mLs by mouth every 6 (six) hours as needed for indigestion or diarrhea or loose stools.   Yes [provider]  fluticasone  (FLONASE ) 50 MCG/ACT nasal spray Place 2 sprays into both nostrils daily as needed for allergies.   Yes [provider]  folic acid  (FOLVITE ) 1 MG tablet Take 1 tablet (1 mg total) by mouth daily. 09/21/22  Yes Pokhrel, Laxman, MD  HYDROcodone -acetaminophen  (NORCO/VICODIN) 5-325 MG tablet Take 1 tablet by mouth 4 (four) times daily. 06/10/23  Yes [provider]  hydrocortisone  cream 1 % Apply topically as needed for itching. 03/07/23  Yes Sebastian Toribio GAILS, MD  lipase/protease/amylase (CREON ) 36000 UNITS CPEP capsule Take 2 capsules (72,000 Units total) by mouth 3 (three) times daily with meals. May also take 1 capsule (36,000 Units total) as needed  (with snacks). Patient taking differently: Take 2 capsules (72,000 Units total) by mouth for breakfast and dinner , then take 1 capsule for lunch. May also take 1 capsule (36,000 Units total) as needed (with snacks). 01/26/23  Yes Cindy Garnette POUR, MD  magic mouthwash SOLN Take 10 mLs by mouth 4 (four) times daily. 06/10/23  Yes [provider]  magnesium  oxide (MAG-OX) 400 (240 Mg) MG tablet Take 400 mg by mouth daily. 04/07/23  Yes [provider]  metoprolol  (TOPROL -XL) 200 MG 24 hr tablet Take 1 tablet (200 mg total) by mouth daily. 03/07/23  Yes Sebastian Toribio GAILS, MD  nitroGLYCERIN  (NITROSTAT ) 0.4 MG SL tablet Place 0.4 mg under the tongue every 5 (five) minutes as needed for chest pain.   Yes [provider]  ondansetron  (ZOFRAN ) 4 MG tablet Take 1 tablet (4 mg total) by mouth every 8 (eight) hours as needed for nausea or vomiting. 05/26/23  Yes Keith, Kayla N, PA-C  pantoprazole  (PROTONIX ) 40 MG tablet Take 1 tablet (40 mg total) by mouth 2 (two) times daily. 05/26/23  Yes Francis Ileana SAILOR, PA-C  pregabalin  (LYRICA ) 100 MG capsule Take 100 mg by mouth 2 (two) times daily. 02/26/23  Yes [provider]  Simethicone  (GAS-X PO) Take 1 capsule by mouth 2 (two) times daily as needed (gas).   Yes [provider]  sucralfate  (CARAFATE ) 1 GM/10ML suspension Take 10 mLs (1 g total) by mouth 4 (four) times daily -  with meals and at bedtime. 05/26/23  Yes Keith, Kayla N, PA-C  diltiazem  (TIAZAC )  120 MG 24 hr capsule Take 1 capsule by mouth in the morning and at bedtime. Patient not taking: Reported on 05/26/2023 04/13/23 07/12/23  [provider]  liver oil-zinc  oxide (DESITIN) 40 % ointment Apply topically 2 (two) times daily. Apply to buttocks Patient taking differently: Apply 1 Application topically as needed for irritation. Apply to buttocks 03/07/23   Sebastian Toribio GAILS, MD  loperamide  (IMODIUM ) 2 MG capsule Take 1 capsule (2 mg total) by mouth as needed for  diarrhea or loose stools. Patient not taking: Reported on 05/26/2023 03/07/23   Sebastian Toribio GAILS, MD  oxyCODONE -acetaminophen  (PERCOCET) 5-325 MG tablet Take 1-2 tablets by mouth every 4 (four) hours as needed. Patient not taking: Reported on 06/15/2023 06/09/23   Haze Lonni PARAS, MD  tamsulosin  (FLOMAX ) 0.4 MG CAPS capsule Take 1 capsule (0.4 mg total) by mouth daily. Patient not taking: Reported on 05/26/2023 03/08/23   Sebastian Toribio GAILS, MD  amitriptyline  (ELAVIL ) 25 MG tablet Take 1 tablet (25 mg total) by mouth at bedtime. Patient not taking: Reported on 08/08/2019 10/15/18 08/08/19  Tobie Yetta HERO, MD      Allergies    Robaxin  [methocarbamol ], Aspirin , Shellfish-derived products, Trazodone  and nefazodone, Adhesive [tape], Fish-derived products, Latex, Toradol  [ketorolac  tromethamine ], Contrast media [iodinated contrast media], Reglan  [metoclopramide ], and Garnell gums oil]    Review of Systems   Review of Systems  Cardiovascular:  Positive for chest pain.  Gastrointestinal:  Positive for abdominal pain.    Physical Exam Updated Vital Signs BP (!) 174/123   Pulse 99   Temp 98.1 F (36.7 C) (Oral)   Resp 15   Ht 5' 8 (1.727 m)   Wt 59 kg   SpO2 100%   BMI 19.77 kg/m  Physical Exam Constitutional:      General: He is not in acute distress.    Appearance: Normal appearance.  HENT:     Head: Normocephalic and atraumatic.     Nose: No congestion or rhinorrhea.  Eyes:     General:        Right eye: No discharge.        Left eye: No discharge.     Extraocular Movements: Extraocular movements intact.     Pupils: Pupils are equal, round, and reactive to light.  Cardiovascular:     Rate and Rhythm: Normal rate and regular rhythm.     Heart sounds: No murmur heard. Pulmonary:     Effort: No respiratory distress.     Breath sounds: No wheezing or rales.  Chest:     Chest wall: No tenderness.  Abdominal:     General: There is no distension.     Tenderness: There is  abdominal tenderness in the epigastric area.  Musculoskeletal:        General: Normal range of motion.     Cervical back: Normal range of motion.  Skin:    General: Skin is warm and dry.  Neurological:     General: No focal deficit present.     Mental Status: He is alert.    ED Results / Procedures / Treatments   Labs (all labs ordered are listed, but only abnormal results are displayed) Labs Reviewed  CBC WITH DIFFERENTIAL/PLATELET - Abnormal; Notable for the following components:      Result Value   RBC 4.07 (*)    Hemoglobin 11.4 (*)    HCT 35.7 (*)    RDW 22.5 (*)    All other components within normal limits  COMPREHENSIVE METABOLIC  PANEL - Abnormal; Notable for the following components:   CO2 14 (*)    Calcium  7.8 (*)    AST 55 (*)    All other components within normal limits  LIPASE, BLOOD  URINALYSIS, ROUTINE W REFLEX MICROSCOPIC  TROPONIN I (HIGH SENSITIVITY)  TROPONIN I (HIGH SENSITIVITY)    EKG EKG Interpretation Date/Time:  Wednesday June 14 2023 22:04:10 EST Ventricular Rate:  111 PR Interval:    QRS Duration:  88 QT Interval:  339 QTC Calculation: 461 R Axis:   76  Text Interpretation: Sinus tachycardia Non-specific ST-t changes Baseline wander Confirmed by Bernard Drivers ([id number]) on 06/14/2023 10:09:35 PM  Radiology CT ABDOMEN PELVIS WO CONTRAST Result Date: 06/15/2023 CLINICAL DATA:  Acute severe pancreatitis. EXAM: CT ABDOMEN AND PELVIS WITHOUT CONTRAST TECHNIQUE: Multidetector CT imaging of the abdomen and pelvis was performed following the standard protocol without IV contrast. RADIATION DOSE REDUCTION: This exam was performed according to the departmental dose-optimization program which includes automated exposure control, adjustment of the mA and/or kV according to patient size and/or use of iterative reconstruction technique. COMPARISON:  CT abdomen and pelvis 06/08/2023. MRI abdomen 06/08/2023. FINDINGS: Lower chest: Lung bases are clear.  Hepatobiliary: No focal liver abnormality is seen. No gallstones, gallbladder wall thickening, or biliary dilatation. Pancreas: Calcifications in the head and body of the pancreas consistent with chronic pancreatitis. 4.2 cm diameter cystic lesion in the body of the pancreas and an additional smaller cystic lesions in the tail of the pancreas measuring up to 1 cm diameter in the tail of the pancreas. These lesions are unchanged since prior study. Lesions could represent pseudocyst or mucinous neoplasm such as IPMN. No edema or infiltration in the peripancreatic fat. Spleen: Normal in size without focal abnormality. Adrenals/Urinary Tract: Adrenal glands are unremarkable. Kidneys are normal, without renal calculi, focal lesion, or hydronephrosis. Bladder wall is diffusely thickened, possibly indicating cystitis. Correlate with urinalysis. Stomach/Bowel: Under distention limits evaluation but there appears to be diffuse gastric wall thickening possibly gastritis or other infiltrating process. Menetrier's disease or neoplastic processes can have this appearance. Similar appearance to prior study. Small bowel and colon are not abnormally distended. No wall thickening or inflammatory stranding identified. Appendix is normal. Vascular/Lymphatic: Aortic atherosclerosis. No enlarged abdominal or pelvic lymph nodes. Reproductive: Prostate is unremarkable. Other: No abdominal wall hernia or abnormality. No abdominopelvic ascites. Musculoskeletal: No acute or significant osseous findings. IMPRESSION: 1. Pancreatic calcification consistent with chronic pancreatitis. 2. Stable appearance of pancreatic cystic lesions measuring up to 4.2 cm diameter, possibly pseudocyst or IPMN. 3. Diffuse gastric wall thickening similar to prior studies. Likely gastritis but other etiologies are not excluded. 4. Aortic atherosclerosis. 5. Mildly thickened bladder wall possibly cystitis. Correlate with urinalysis. Electronically Signed   By:  Elsie Gravely M.D.   On: 06/15/2023 01:52   DG Chest Portable 1 View Result Date: 06/14/2023 CLINICAL DATA:  Chest pain EXAM: PORTABLE CHEST 1 VIEW COMPARISON:  05/26/2023 FINDINGS: The heart size and mediastinal contours are within normal limits. Both lungs are clear. The visualized skeletal structures are unremarkable. IMPRESSION: No active disease. Electronically Signed   By: Luke Bun M.D.   On: 06/14/2023 22:51    Procedures Procedures    Medications Ordered in ED Medications  morphine  (PF) 2 MG/ML injection 2 mg (2 mg Intravenous Given 06/14/23 2317)  ondansetron  (ZOFRAN ) injection 4 mg (4 mg Intravenous Given 06/14/23 2315)  lactated ringers  bolus 1,000 mL (0 mLs Intravenous Stopped 06/15/23 0032)  alum & mag hydroxide-simeth (MAALOX/MYLANTA) 200-200-20  MG/5ML suspension 30 mL (30 mLs Oral Given 06/14/23 2315)  lidocaine  (XYLOCAINE ) 2 % viscous mouth solution 15 mL (15 mLs Mouth/Throat Given 06/14/23 2315)  famotidine  (PEPCID ) IVPB 20 mg premix (0 mg Intravenous Stopped 06/15/23 0022)  morphine  (PF) 2 MG/ML injection 2 mg (2 mg Intravenous Given 06/15/23 0032)  droperidol  (INAPSINE ) 2.5 MG/ML injection 1.25 mg (1.25 mg Intravenous Given 06/15/23 0111)    ED Course/ Medical Decision Making/ A&P                                 Medical Decision Making Amount and/or Complexity of Data Reviewed Labs: ordered. Radiology: ordered.  Risk OTC drugs. Prescription drug management.   This patient presents to the ED for concern of abdominal pain and chest pain, this involves an extensive number of treatment options, and is a complaint that carries with it a high risk of complications and morbidity.  The differential diagnosis includes pancreatitis flare, cholecystitis, appendicitis, gastroenteritis.  Differential for chest pain includes GERD, ACS, pneumonia, others   Co morbidities that complicate the patient evaluation  Chronic pancreatitis, chest pain   Additional history  obtained:  Additional history obtained from EMS External records from outside source obtained and reviewed including previous charts   Lab Tests:  I Ordered, and personally interpreted labs.  The pertinent results include: Lipase 18, negative troponin x 2, unremarkable UA, grossly unremarkable CMP, CBC   Imaging Studies ordered:  I ordered imaging studies including chest x-ray and CT abdomen pelvis without contrast I independently visualized and interpreted imaging which showed no active disease on chest x-ray.  CT shows: 1. Pancreatic calcification consistent with chronic pancreatitis.  2. Stable appearance of pancreatic cystic lesions measuring up to  4.2 cm diameter, possibly pseudocyst or IPMN.  3. Diffuse gastric wall thickening similar to prior studies. Likely  gastritis but other etiologies are not excluded.  4. Aortic atherosclerosis.  5. Mildly thickened bladder wall possibly cystitis. Correlate with  urinalysis.   I agree with the radiologist interpretation   Cardiac Monitoring: / EKG:  The patient was maintained on a cardiac monitor.  I personally viewed and interpreted the cardiac monitored which showed an underlying rhythm of: Sinus tachycardia   Problem List / ED Course / Critical interventions / Medication management   I ordered medication including viscous lidocaine , Maalox, Pepcid  for reflux-like symptoms; morphine , droperidol  for abdominal pain, Zofran  droperidol  for nausea Reevaluation of the patient after these medicines showed that the patient improved I have reviewed the patients home medicines and have made adjustments as needed   Social Determinants of Health:  Patient is a daily tobacco smoker, continues to drink with known chronic pancreatitis   Test / Admission - Considered:  Patient presentation with chronic pancreatitis. CT with no worrisome/acute findings. No sign of cystitis with unremarkable UA.  Negative troponins x 2 with nonischemic  EKG.  No signs of ACS.  No pneumonia on chest x-ray.  Presentation this time seems like chronic pain.  Will discharge home with recommendations for follow-up with pain management clinic and GI for further management of chronic pancreatitis.         Final Clinical Impression(s) / ED Diagnoses Final diagnoses:  Chest pain, unspecified type  Generalized abdominal pain    Rx / DC Orders ED Discharge Orders     None         Logan Ubaldo KATHEE DEVONNA 06/15/23 0253    Horton,  Charmaine FALCON, MD 06/15/23 [phone]

## 2023-06-15 NOTE — ED Notes (Signed)
 Patient back from CT.

## 2023-06-15 NOTE — ED Notes (Signed)
 Pt provided w/ water  and encouraged to try and drink something. Pt vomiting, unable to tolerate PO at this time.

## 2023-06-15 NOTE — ED Notes (Signed)
 Pt called his mother to give him a ride home.

## 2023-06-18 ENCOUNTER — Inpatient Hospital Stay (HOSPITAL_COMMUNITY)
Admission: EM | Admit: 2023-06-18 | Discharge: 2023-06-20 | DRG: 391 | Disposition: A | Discharge status: Home / Self Care | Payer: MEDICAID | Attending: Internal Medicine | Admitting: Internal Medicine

## 2023-06-18 ENCOUNTER — Other Ambulatory Visit: Payer: Self-pay

## 2023-06-18 ENCOUNTER — Encounter (HOSPITAL_COMMUNITY): Payer: Self-pay

## 2023-06-18 DIAGNOSIS — Z1152 Encounter for screening for COVID-19: Secondary | ICD-10-CM

## 2023-06-18 DIAGNOSIS — K852 Alcohol induced acute pancreatitis without necrosis or infection: Secondary | ICD-10-CM | POA: Diagnosis present

## 2023-06-18 DIAGNOSIS — R1110 Vomiting, unspecified: Secondary | ICD-10-CM

## 2023-06-18 DIAGNOSIS — R112 Nausea with vomiting, unspecified: Secondary | ICD-10-CM | POA: Diagnosis present

## 2023-06-18 DIAGNOSIS — F1210 Cannabis abuse, uncomplicated: Secondary | ICD-10-CM | POA: Diagnosis present

## 2023-06-18 DIAGNOSIS — I10 Essential (primary) hypertension: Secondary | ICD-10-CM | POA: Diagnosis present

## 2023-06-18 DIAGNOSIS — R030 Elevated blood-pressure reading, without diagnosis of hypertension: Secondary | ICD-10-CM

## 2023-06-18 DIAGNOSIS — F1490 Cocaine use, unspecified, uncomplicated: Secondary | ICD-10-CM

## 2023-06-18 DIAGNOSIS — K219 Gastro-esophageal reflux disease without esophagitis: Secondary | ICD-10-CM | POA: Diagnosis present

## 2023-06-18 DIAGNOSIS — Z8711 Personal history of peptic ulcer disease: Secondary | ICD-10-CM

## 2023-06-18 DIAGNOSIS — F4310 Post-traumatic stress disorder, unspecified: Secondary | ICD-10-CM | POA: Diagnosis present

## 2023-06-18 DIAGNOSIS — E7800 Pure hypercholesterolemia, unspecified: Secondary | ICD-10-CM | POA: Diagnosis present

## 2023-06-18 DIAGNOSIS — K859 Acute pancreatitis without necrosis or infection, unspecified: Secondary | ICD-10-CM | POA: Diagnosis present

## 2023-06-18 DIAGNOSIS — F17210 Nicotine dependence, cigarettes, uncomplicated: Secondary | ICD-10-CM | POA: Diagnosis present

## 2023-06-18 DIAGNOSIS — Z8249 Family history of ischemic heart disease and other diseases of the circulatory system: Secondary | ICD-10-CM

## 2023-06-18 DIAGNOSIS — I480 Paroxysmal atrial fibrillation: Secondary | ICD-10-CM | POA: Diagnosis present

## 2023-06-18 DIAGNOSIS — F319 Bipolar disorder, unspecified: Secondary | ICD-10-CM | POA: Diagnosis present

## 2023-06-18 DIAGNOSIS — E872 Acidosis, unspecified: Secondary | ICD-10-CM | POA: Diagnosis present

## 2023-06-18 DIAGNOSIS — R1013 Epigastric pain: Principal | ICD-10-CM

## 2023-06-18 DIAGNOSIS — F1010 Alcohol abuse, uncomplicated: Secondary | ICD-10-CM | POA: Diagnosis present

## 2023-06-18 DIAGNOSIS — K860 Alcohol-induced chronic pancreatitis: Principal | ICD-10-CM | POA: Diagnosis present

## 2023-06-18 DIAGNOSIS — F1910 Other psychoactive substance abuse, uncomplicated: Secondary | ICD-10-CM | POA: Diagnosis present

## 2023-06-18 DIAGNOSIS — F1290 Cannabis use, unspecified, uncomplicated: Secondary | ICD-10-CM

## 2023-06-18 DIAGNOSIS — R109 Unspecified abdominal pain: Secondary | ICD-10-CM | POA: Diagnosis present

## 2023-06-18 DIAGNOSIS — Z79899 Other long term (current) drug therapy: Secondary | ICD-10-CM

## 2023-06-18 DIAGNOSIS — F419 Anxiety disorder, unspecified: Secondary | ICD-10-CM | POA: Diagnosis present

## 2023-06-18 DIAGNOSIS — Y905 Blood alcohol level of 100-119 mg/100 ml: Secondary | ICD-10-CM | POA: Diagnosis present

## 2023-06-18 DIAGNOSIS — E8729 Other acidosis: Secondary | ICD-10-CM | POA: Diagnosis present

## 2023-06-18 DIAGNOSIS — D573 Sickle-cell trait: Secondary | ICD-10-CM | POA: Diagnosis present

## 2023-06-18 DIAGNOSIS — A0811 Acute gastroenteropathy due to Norwalk agent: Principal | ICD-10-CM | POA: Diagnosis present

## 2023-06-18 DIAGNOSIS — F1410 Cocaine abuse, uncomplicated: Secondary | ICD-10-CM | POA: Diagnosis present

## 2023-06-18 LAB — RESP PANEL BY RT-PCR (RSV, FLU A&B, COVID)  RVPGX2
Influenza A by PCR: NEGATIVE
Influenza B by PCR: NEGATIVE
Resp Syncytial Virus by PCR: NEGATIVE
SARS Coronavirus 2 by RT PCR: NEGATIVE

## 2023-06-18 LAB — URINALYSIS, ROUTINE W REFLEX MICROSCOPIC
Bilirubin Urine: NEGATIVE
Glucose, UA: NEGATIVE mg/dL
Hgb urine dipstick: NEGATIVE
Ketones, ur: NEGATIVE mg/dL
Leukocytes,Ua: NEGATIVE
Nitrite: NEGATIVE
Protein, ur: NEGATIVE mg/dL
Specific Gravity, Urine: 1.013 (ref 1.005–1.030)
pH: 5 (ref 5.0–8.0)

## 2023-06-18 LAB — COMPREHENSIVE METABOLIC PANEL
ALT: 42 U/L (ref 0–44)
AST: 104 U/L — ABNORMAL HIGH (ref 15–41)
Albumin: 4 g/dL (ref 3.5–5.0)
Alkaline Phosphatase: 89 U/L (ref 38–126)
Anion gap: 20 — ABNORMAL HIGH (ref 5–15)
BUN: 7 mg/dL (ref 6–20)
CO2: 12 mmol/L — ABNORMAL LOW (ref 22–32)
Calcium: 7.4 mg/dL — ABNORMAL LOW (ref 8.9–10.3)
Chloride: 104 mmol/L (ref 98–111)
Creatinine, Ser: 0.99 mg/dL (ref 0.61–1.24)
GFR, Estimated: >60 mL/min (ref >60)
Glucose, Bld: 78 mg/dL (ref 70–99)
Potassium: 4.6 mmol/L (ref 3.5–5.1)
Sodium: 136 mmol/L (ref 135–145)
Total Bilirubin: 1.1 mg/dL (ref 0.0–1.2)
Total Protein: 7.7 g/dL (ref 6.5–8.1)

## 2023-06-18 LAB — RAPID URINE DRUG SCREEN, HOSP PERFORMED
Amphetamines: NOT DETECTED
Barbiturates: NOT DETECTED
Benzodiazepines: NOT DETECTED
Cocaine: POSITIVE — AB
Opiates: POSITIVE — AB
Tetrahydrocannabinol: POSITIVE — AB

## 2023-06-18 LAB — CBC
HCT: 34.2 % — ABNORMAL LOW (ref 39.0–52.0)
Hemoglobin: 11.1 g/dL — ABNORMAL LOW (ref 13.0–17.0)
MCH: 28 pg (ref 26.0–34.0)
MCHC: 32.5 g/dL (ref 30.0–36.0)
MCV: 86.4 fL (ref 80.0–100.0)
Platelets: 191 10*3/uL (ref 150–400)
RBC: 3.96 MIL/uL — ABNORMAL LOW (ref 4.22–5.81)
RDW: 21.5 % — ABNORMAL HIGH (ref 11.5–15.5)
WBC: 7.9 10*3/uL (ref 4.0–10.5)
nRBC: 0 % (ref 0.0–0.2)

## 2023-06-18 LAB — LIPASE, BLOOD: Lipase: 19 U/L (ref 11–51)

## 2023-06-18 LAB — ETHANOL: Alcohol, Ethyl (B): 104 mg/dL — ABNORMAL HIGH (ref <10)

## 2023-06-18 MED ORDER — LACTATED RINGERS IV SOLN: INTRAVENOUS | Status: DC

## 2023-06-18 MED ORDER — METOPROLOL SUCCINATE ER 100 MG PO TB24
200.0000 mg | ORAL_TABLET | Freq: Every day | ORAL | Status: DC
  Administered 2023-06-18 – 2023-06-20 (×3): 200 mg via ORAL
  Filled 2023-06-18 (×2): qty 2
  Filled 2023-06-18: qty 8

## 2023-06-18 MED ORDER — HYDRALAZINE HCL 20 MG/ML IJ SOLN: 10.0000 mg | Freq: Four times a day (QID) | INTRAMUSCULAR | Status: DC | PRN

## 2023-06-18 MED ORDER — LABETALOL HCL 5 MG/ML IV SOLN
20.0000 mg | Freq: Once | INTRAVENOUS | Status: AC
  Administered 2023-06-18: 20 mg via INTRAVENOUS
  Filled 2023-06-18: qty 4

## 2023-06-18 MED ORDER — ACETAMINOPHEN 650 MG RE SUPP: 650.0000 mg | Freq: Four times a day (QID) | RECTAL | Status: DC | PRN

## 2023-06-18 MED ORDER — PANCRELIPASE (LIP-PROT-AMYL) 36000-114000 UNITS PO CPEP: 36000.0000 [IU] | ORAL_CAPSULE | ORAL | Status: DC | PRN

## 2023-06-18 MED ORDER — ALBUTEROL SULFATE (2.5 MG/3ML) 0.083% IN NEBU: 2.5000 mg | INHALATION_SOLUTION | Freq: Four times a day (QID) | RESPIRATORY_TRACT | Status: DC | PRN

## 2023-06-18 MED ORDER — PANCRELIPASE (LIP-PROT-AMYL) 36000-114000 UNITS PO CPEP
72000.0000 [IU] | ORAL_CAPSULE | Freq: Three times a day (TID) | ORAL | Status: DC
  Administered 2023-06-18 – 2023-06-20 (×4): 72000 [IU] via ORAL
  Filled 2023-06-18 (×6): qty 2

## 2023-06-18 MED ORDER — MORPHINE SULFATE (PF) 4 MG/ML IV SOLN
4.0000 mg | Freq: Once | INTRAVENOUS | Status: AC
Start: 2023-06-18 — End: 2023-06-18
  Administered 2023-06-18: 4 mg via INTRAVENOUS
  Filled 2023-06-18: qty 1

## 2023-06-18 MED ORDER — SODIUM CHLORIDE 0.9 % IV SOLN: 250.0000 mL | INTRAVENOUS | Status: DC | PRN

## 2023-06-18 MED ORDER — SODIUM CHLORIDE 0.9% FLUSH: 3.0000 mL | INTRAVENOUS | Status: DC | PRN

## 2023-06-18 MED ORDER — NITROGLYCERIN 0.4 MG SL SUBL: 0.4000 mg | SUBLINGUAL_TABLET | SUBLINGUAL | Status: DC | PRN

## 2023-06-18 MED ORDER — ONDANSETRON HCL 4 MG/2ML IJ SOLN
4.0000 mg | Freq: Four times a day (QID) | INTRAMUSCULAR | Status: DC | PRN
  Administered 2023-06-20: 4 mg via INTRAVENOUS
  Filled 2023-06-18 (×2): qty 2

## 2023-06-18 MED ORDER — NICOTINE 21 MG/24HR TD PT24
21.0000 mg | MEDICATED_PATCH | Freq: Every day | TRANSDERMAL | Status: DC
  Administered 2023-06-18 – 2023-06-20 (×3): 21 mg via TRANSDERMAL
  Filled 2023-06-18 (×3): qty 1

## 2023-06-18 MED ORDER — FAMOTIDINE 20 MG PO TABS
20.0000 mg | ORAL_TABLET | Freq: Once | ORAL | Status: AC
  Administered 2023-06-18: 20 mg via ORAL
  Filled 2023-06-18: qty 1

## 2023-06-18 MED ORDER — SUCRALFATE 1 GM/10ML PO SUSP
1.0000 g | Freq: Three times a day (TID) | ORAL | Status: DC
  Administered 2023-06-18 – 2023-06-20 (×7): 1 g via ORAL
  Filled 2023-06-18 (×8): qty 10

## 2023-06-18 MED ORDER — PANTOPRAZOLE SODIUM 40 MG IV SOLR
80.0000 mg | Freq: Once | INTRAVENOUS | Status: AC
  Administered 2023-06-18: 80 mg via INTRAVENOUS
  Filled 2023-06-18: qty 20

## 2023-06-18 MED ORDER — LACTATED RINGERS IV BOLUS
1000.0000 mL | Freq: Once | INTRAVENOUS | Status: AC
  Administered 2023-06-18: 1000 mL via INTRAVENOUS

## 2023-06-18 MED ORDER — ACETAMINOPHEN 500 MG PO TABS
1000.0000 mg | ORAL_TABLET | Freq: Once | ORAL | Status: AC
  Administered 2023-06-18: 1000 mg via ORAL
  Filled 2023-06-18: qty 2

## 2023-06-18 MED ORDER — HYDROMORPHONE HCL 1 MG/ML IJ SOLN
0.5000 mg | INTRAMUSCULAR | Status: DC | PRN
  Administered 2023-06-18 – 2023-06-20 (×7): 0.5 mg via INTRAVENOUS
  Filled 2023-06-18: qty 1
  Filled 2023-06-18 (×3): qty 0.5
  Filled 2023-06-18: qty 1
  Filled 2023-06-18: qty 0.5
  Filled 2023-06-18: qty 1
  Filled 2023-06-18 (×2): qty 0.5

## 2023-06-18 MED ORDER — DICYCLOMINE HCL 10 MG PO CAPS
10.0000 mg | ORAL_CAPSULE | Freq: Three times a day (TID) | ORAL | Status: DC
  Administered 2023-06-18 – 2023-06-20 (×6): 10 mg via ORAL
  Filled 2023-06-18 (×7): qty 1

## 2023-06-18 MED ORDER — LORAZEPAM 1 MG PO TABS: 1.0000 mg | ORAL_TABLET | ORAL | Status: DC | PRN

## 2023-06-18 MED ORDER — FOLIC ACID 1 MG PO TABS
1.0000 mg | ORAL_TABLET | Freq: Every day | ORAL | Status: DC
  Administered 2023-06-18 – 2023-06-20 (×3): 1 mg via ORAL
  Filled 2023-06-18 (×3): qty 1

## 2023-06-18 MED ORDER — BISMUTH SUBSALICYLATE 262 MG/15ML PO SUSP: 15.0000 mL | Freq: Four times a day (QID) | ORAL | Status: DC | PRN

## 2023-06-18 MED ORDER — SODIUM CHLORIDE 0.9% FLUSH
3.0000 mL | Freq: Two times a day (BID) | INTRAVENOUS | Status: DC
  Administered 2023-06-18 – 2023-06-19 (×3): 3 mL via INTRAVENOUS

## 2023-06-18 MED ORDER — LORAZEPAM 2 MG/ML IJ SOLN
1.0000 mg | INTRAMUSCULAR | Status: DC | PRN
  Administered 2023-06-18 – 2023-06-19 (×2): 2 mg via INTRAVENOUS
  Administered 2023-06-19 (×2): 4 mg via INTRAVENOUS
  Administered 2023-06-19 (×2): 1 mg via INTRAVENOUS
  Administered 2023-06-20: 2 mg via INTRAVENOUS
  Filled 2023-06-18 (×2): qty 1
  Filled 2023-06-18: qty 2
  Filled 2023-06-18 (×3): qty 1
  Filled 2023-06-18: qty 2

## 2023-06-18 MED ORDER — PANTOPRAZOLE SODIUM 40 MG PO TBEC
40.0000 mg | DELAYED_RELEASE_TABLET | Freq: Two times a day (BID) | ORAL | Status: DC
  Administered 2023-06-18 – 2023-06-20 (×5): 40 mg via ORAL
  Filled 2023-06-18 (×5): qty 1

## 2023-06-18 MED ORDER — ALUM & MAG HYDROXIDE-SIMETH 200-200-20 MG/5ML PO SUSP
30.0000 mL | Freq: Once | ORAL | Status: AC
  Administered 2023-06-18: 30 mL via ORAL
  Filled 2023-06-18: qty 30

## 2023-06-18 MED ORDER — ADULT MULTIVITAMIN W/MINERALS CH
1.0000 | ORAL_TABLET | Freq: Every day | ORAL | Status: DC
  Administered 2023-06-18 – 2023-06-20 (×3): 1 via ORAL
  Filled 2023-06-18 (×3): qty 1

## 2023-06-18 MED ORDER — ACETAMINOPHEN 325 MG PO TABS
650.0000 mg | ORAL_TABLET | Freq: Four times a day (QID) | ORAL | Status: DC | PRN
  Administered 2023-06-19: 650 mg via ORAL
  Filled 2023-06-18 (×2): qty 2

## 2023-06-18 MED ORDER — MORPHINE SULFATE (PF) 2 MG/ML IV SOLN
2.0000 mg | Freq: Once | INTRAVENOUS | Status: AC
  Administered 2023-06-18: 2 mg via INTRAVENOUS
  Filled 2023-06-18: qty 1

## 2023-06-18 MED ORDER — ONDANSETRON HCL 4 MG PO TABS: 4.0000 mg | ORAL_TABLET | Freq: Four times a day (QID) | ORAL | Status: DC | PRN

## 2023-06-18 MED ORDER — POLYETHYLENE GLYCOL 3350 17 G PO PACK: 17.0000 g | PACK | Freq: Every day | ORAL | Status: DC | PRN

## 2023-06-18 MED ORDER — ENOXAPARIN SODIUM 40 MG/0.4ML IJ SOSY
40.0000 mg | PREFILLED_SYRINGE | INTRAMUSCULAR | Status: DC
  Administered 2023-06-19: 40 mg via SUBCUTANEOUS
  Filled 2023-06-18: qty 0.4

## 2023-06-18 MED ORDER — ONDANSETRON HCL 4 MG/2ML IJ SOLN
4.0000 mg | Freq: Once | INTRAMUSCULAR | Status: AC
  Administered 2023-06-18: 4 mg via INTRAVENOUS
  Filled 2023-06-18: qty 2

## 2023-06-18 MED ORDER — ENOXAPARIN SODIUM 40 MG/0.4ML IJ SOSY
40.0000 mg | PREFILLED_SYRINGE | INTRAMUSCULAR | Status: DC
  Administered 2023-06-18: 40 mg via SUBCUTANEOUS
  Filled 2023-06-18: qty 0.4

## 2023-06-18 MED ORDER — PREGABALIN 100 MG PO CAPS
100.0000 mg | ORAL_CAPSULE | Freq: Two times a day (BID) | ORAL | Status: DC
  Administered 2023-06-18 – 2023-06-20 (×5): 100 mg via ORAL
  Filled 2023-06-18 (×5): qty 1

## 2023-06-18 MED ORDER — THIAMINE MONONITRATE 100 MG PO TABS
100.0000 mg | ORAL_TABLET | Freq: Every day | ORAL | Status: DC
  Administered 2023-06-18 – 2023-06-20 (×3): 100 mg via ORAL
  Filled 2023-06-18 (×3): qty 1

## 2023-06-18 MED ORDER — MORPHINE SULFATE (PF) 4 MG/ML IV SOLN
4.0000 mg | Freq: Once | INTRAVENOUS | Status: AC
  Administered 2023-06-18: 4 mg via INTRAVENOUS
  Filled 2023-06-18: qty 1

## 2023-06-18 MED ORDER — MAGIC MOUTHWASH
10.0000 mL | Freq: Four times a day (QID) | ORAL | Status: DC
  Administered 2023-06-18 – 2023-06-20 (×4): 10 mL via ORAL
  Filled 2023-06-18 (×11): qty 10

## 2023-06-18 MED ORDER — FLUTICASONE PROPIONATE 50 MCG/ACT NA SUSP: 2.0000 | Freq: Every day | NASAL | Status: DC | PRN

## 2023-06-18 MED ORDER — BISACODYL 5 MG PO TBEC: 5.0000 mg | DELAYED_RELEASE_TABLET | Freq: Every day | ORAL | Status: DC | PRN

## 2023-06-18 NOTE — H&P (Signed)
 [facility] History and Physical  [name] FMW:[id number] DOB: 1970-10-03 DOA: 06/18/2023  Referring physician: ED  PCP: Celestia Rosaline SQUIBB, NP   Patient is coming from: Home   Chief Complaint: abdominal pain  HPI: Patient is a 53 years old male with past medical history of chronic pancreatitis, poorly substance abuse, bipolar disorder, GERD presented to hospital with epigastric pain for last 2 days which was a constant in nature dull in intensity around epigastric region without obvious radiation.  Maybe had nausea but no vomiting and has been having bowel movements.  Patient does complain of having chronic pancreatitis and indicates compliance with medication.  Has been drinking alcohol  occasionally.  Patient reported doing cocaine and marijuana recently.  Patient denies any fever, chills or rigor.  Denies any chest pain, shortness of breath or dyspnea.  Denies any dizziness, lightheadedness or syncope.  Denies any urinary urgency, frequency or dysuria.  In the ED patient was noted to have normal CBC.  Chemistry showed lipase of 20.  Creatinine 0.9.  Urine drug screen positive for opiates cocaine and THC.  Urinalysis was negative for acute infection.  EKG done in the ED showed sinus tachycardia.  In the ED, patient received Ringer lactate 1 L bolus, IV Protonix , morphine , Zofran  and was considered for admission to hospital for acute on chronic alcohol  induced pancreatitis, cannabis hyperemesis syndrome, Cocaine abuse.  Assessment and Plan Principal Problem:   Intractable nausea and vomiting Active Problems:   Abdominal pain   Acute on chronic pancreatitis (HCC)   Paroxysmal atrial fibrillation (HCC)   Metabolic acidosis, increased anion gap   Alcohol  abuse  Intractable nausea and vomiting likely secondary to polysubstance abuse, cannabis abuse.  Counseling done.  Continue IV hydration antiemetics analgesics.  Will keep on clears and advance as tolerated. Put on CIWA  protocol, nicotine  patch.  Counseled against polysubstance abuse.  Will get TOC involvement.  Abdominal pain.  Likely secondary to chronic pancreatitis.  Epigastric constant.  Continue analgesics.  Counseled against polysubstance abuse and alcohol .  Put the patient on CIWA protocol, and dicyclomine .  History of alcohol  consumption with chronic pancreatitis.  Continue supportive care including hydration, antiemetics, analgesics, was for withdrawal.  CIWA protocol.  History of GERD.  Will continue with Protonix .  DVT Prophylaxis: PPI  Review of Systems:  All systems were reviewed and were negative unless otherwise mentioned in the HPI   Past Medical History:  Diagnosis Date   Alcoholism /alcohol  abuse    Anemia    Anxiety    Arthritis    knees; arms; elbows (03/26/2015)   Asthma    Bipolar disorder (HCC)    Chronic bronchitis (HCC)    Chronic lower back pain    Chronic pancreatitis (HCC)    Cocaine abuse (HCC)    Depression    Family history of adverse reaction to anesthesia    Femoral condyle fracture (HCC) 03/08/2014   left medial/notes 03/09/2014   GERD (gastroesophageal reflux disease)    H/O hiatal hernia    H/O suicide attempt 10/2012   High cholesterol    History of blood transfusion 10/2012   when I tried to commit suicide   History of stomach ulcers    Hypertension    Marijuana abuse, continuous    Migraine    a few times/year (03/26/2015)   Pancreatitis    Pneumonia 1990's X 3   PTSD (post-traumatic stress disorder)    Seizures (HCC)    Sickle cell trait (HCC)  WPW (Wolff-Parkinson-White syndrome)    thelbert 03/06/2013   Past Surgical History:  Procedure Laterality Date   BIOPSY  11/25/2017   Procedure: BIOPSY;  Surgeon: Burnette Fallow, MD;  Location: [facility] ENDOSCOPY;  Service: Endoscopy;;   BIOPSY  10/14/2018   Procedure: BIOPSY;  Surgeon: Burnette Fallow, MD;  Location: [facility] ENDOSCOPY;  Service: Endoscopy;;   BIOPSY  03/06/2023   Procedure: BIOPSY;   Surgeon: Wilhelmenia Aloha Raddle., MD;  Location: [facility] ENDOSCOPY;  Service: Gastroenterology;;   CARDIAC CATHETERIZATION     CYST ENTEROSTOMY  01/02/2020   Procedure: CYST ASPIRATION;  Surgeon: Teressa Toribio SQUIBB, MD;  Location: [facility] ENDOSCOPY;  Service: Endoscopy;;   ESOPHAGOGASTRODUODENOSCOPY N/A 03/06/2023   Procedure: ESOPHAGOGASTRODUODENOSCOPY (EGD);  Surgeon: Wilhelmenia Aloha Raddle., MD;  Location: [facility] ENDOSCOPY;  Service: Gastroenterology;  Laterality: N/A;   ESOPHAGOGASTRODUODENOSCOPY (EGD) WITH PROPOFOL  N/A 11/25/2017   Procedure: ESOPHAGOGASTRODUODENOSCOPY (EGD) WITH PROPOFOL ;  Surgeon: Burnette Fallow, MD;  Location: [facility]OSCOPY;  Service: Endoscopy;  Laterality: N/A;   ESOPHAGOGASTRODUODENOSCOPY (EGD) WITH PROPOFOL  Left 10/14/2018   Procedure: ESOPHAGOGASTRODUODENOSCOPY (EGD) WITH PROPOFOL ;  Surgeon: Burnette Fallow, MD;  Location: [facility] ENDOSCOPY;  Service: Endoscopy;  Laterality: Left;   ESOPHAGOGASTRODUODENOSCOPY (EGD) WITH PROPOFOL  N/A 11/14/2018   Procedure: ESOPHAGOGASTRODUODENOSCOPY (EGD) WITH PROPOFOL ;  Surgeon: Celestia Agent, MD;  Location: [facility] ENDOSCOPY;  Service: Gastroenterology;  Laterality: N/A;   ESOPHAGOGASTRODUODENOSCOPY (EGD) WITH PROPOFOL  N/A 01/02/2020   Procedure: ESOPHAGOGASTRODUODENOSCOPY (EGD) WITH PROPOFOL ;  Surgeon: Teressa Toribio SQUIBB, MD;  Location: [facility] ENDOSCOPY;  Service: Endoscopy;  Laterality: N/A;   ESOPHAGOGASTRODUODENOSCOPY (EGD) WITH PROPOFOL  N/A 10/25/2020   Procedure: ESOPHAGOGASTRODUODENOSCOPY (EGD) WITH PROPOFOL ;  Surgeon: Wilhelmenia Aloha Raddle., MD;  Location: [facility] ENDOSCOPY;  Service: Gastroenterology;  Laterality: N/A;   EUS N/A 01/02/2020   Procedure: UPPER ENDOSCOPIC ULTRASOUND (EUS) RADIAL;  Surgeon: Teressa Toribio SQUIBB, MD;  Location: [facility] ENDOSCOPY;  Service: Endoscopy;  Laterality: N/A;   EUS N/A 03/06/2023   Procedure: UPPER ENDOSCOPIC ULTRASOUND (EUS) RADIAL;  Surgeon: Wilhelmenia Aloha Raddle., MD;  Location: [facility] ENDOSCOPY;  Service: Gastroenterology;  Laterality:  N/A;   EYE SURGERY Left 1990's   result of trauma    FACIAL FRACTURE SURGERY Left 1990's   result of trauma    FINE NEEDLE ASPIRATION N/A 03/06/2023   Procedure: FINE NEEDLE ASPIRATION (FNA) LINEAR;  Surgeon: Wilhelmenia Aloha Raddle., MD;  Location: [facility] ENDOSCOPY;  Service: Gastroenterology;  Laterality: N/A;   FLEXIBLE SIGMOIDOSCOPY N/A 10/25/2020   Procedure: FLEXIBLE SIGMOIDOSCOPY;  Surgeon: Wilhelmenia Aloha Raddle., MD;  Location: [facility] ENDOSCOPY;  Service: Gastroenterology;  Laterality: N/A;   FRACTURE SURGERY     HEMOSTASIS CLIP PLACEMENT  10/25/2020   Procedure: HEMOSTASIS CLIP PLACEMENT;  Surgeon: Wilhelmenia Aloha Raddle., MD;  Location: [facility] ENDOSCOPY;  Service: Gastroenterology;;   HERNIA REPAIR     HOT HEMOSTASIS N/A 03/06/2023   Procedure: HOT HEMOSTASIS (ARGON PLASMA COAGULATION/BICAP);  Surgeon: Wilhelmenia Aloha Raddle., MD;  Location: [facility] ENDOSCOPY;  Service: Gastroenterology;  Laterality: N/A;   LEFT HEART CATHETERIZATION WITH CORONARY ANGIOGRAM Right 03/07/2013   Procedure: LEFT HEART CATHETERIZATION WITH CORONARY ANGIOGRAM;  Surgeon: Salena GORMAN Negri, MD;  Location: [facility] CATH LAB;  Service: Cardiovascular;  Laterality: Right;   UMBILICAL HERNIA REPAIR     UPPER GASTROINTESTINAL ENDOSCOPY      Social History:  reports that he has been smoking cigarettes and e-cigarettes. He has a 36 pack-year smoking history. He has never used smokeless tobacco. He reports that he does not currently use alcohol  after a past usage of about 4.0 standard drinks of alcohol  per week.  He reports that he does not currently use drugs after having used the following drugs: Marijuana and Cocaine.  Allergies  Allergen Reactions   Robaxin  [Methocarbamol ] Other (See Comments)    jumpy limbs   Aspirin  Other (See Comments)    Unknown reaction   Shellfish-Derived Products Nausea And Vomiting and Rash   Trazodone  And Nefazodone Other (See Comments)    Muscle spasms   Adhesive [Tape] Itching   Fish-Derived  Products    Latex Itching   Toradol  [Ketorolac  Tromethamine ] Other (See Comments)    Has ulcers; cannot have this   Contrast Media [Iodinated Contrast Media] Hives   Reglan  [Metoclopramide ] Other (See Comments)    Muscle spasms   Salmon [Fish Oil] Nausea And Vomiting and Rash    Family History  Problem Relation Age of Onset   Hypertension Mother    Cirrhosis Father    Alcoholism Father    Hypertension Father    Melanoma Father    Hypertension Other    Coronary artery disease Other      Prior to Admission medications   Medication Sig Start Date End Date Taking? Authorizing Provider  amoxicillin  (AMOXIL ) 875 MG tablet Take 875 mg by mouth 2 (two) times daily. 06/09/23   [provider]  bismuth  subsalicylate (KAOPECTATE) 262 MG/15ML suspension Take 15 mLs by mouth every 6 (six) hours as needed for indigestion or diarrhea or loose stools.    [provider]  diltiazem  (TIAZAC ) 120 MG 24 hr capsule Take 1 capsule by mouth in the morning and at bedtime. Patient not taking: Reported on 05/26/2023 04/13/23 07/12/23  [provider]  fluticasone  (FLONASE ) 50 MCG/ACT nasal spray Place 2 sprays into both nostrils daily as needed for allergies.    [provider]  folic acid  (FOLVITE ) 1 MG tablet Take 1 tablet (1 mg total) by mouth daily. 09/21/22   Maven Varelas, MD  HYDROcodone -acetaminophen  (NORCO/VICODIN) 5-325 MG tablet Take 1 tablet by mouth 4 (four) times daily. 06/10/23   [provider]  hydrocortisone  cream 1 % Apply topically as needed for itching. 03/07/23   Sebastian Toribio GAILS, MD  lipase/protease/amylase (CREON ) 36000 UNITS CPEP capsule Take 2 capsules (72,000 Units total) by mouth 3 (three) times daily with meals. May also take 1 capsule (36,000 Units total) as needed (with snacks). Patient taking differently: Take 2 capsules (72,000 Units total) by mouth for breakfast and dinner , then take 1 capsule for lunch. May also take 1 capsule (36,000  Units total) as needed (with snacks). 01/26/23   Cindy Garnette POUR, MD  liver oil-zinc  oxide (DESITIN) 40 % ointment Apply topically 2 (two) times daily. Apply to buttocks Patient taking differently: Apply 1 Application topically as needed for irritation. Apply to buttocks 03/07/23   Sebastian Toribio GAILS, MD  loperamide  (IMODIUM ) 2 MG capsule Take 1 capsule (2 mg total) by mouth as needed for diarrhea or loose stools. Patient not taking: Reported on 05/26/2023 03/07/23   Sebastian Toribio GAILS, MD  magic mouthwash SOLN Take 10 mLs by mouth 4 (four) times daily. 06/10/23   [provider]  magnesium  oxide (MAG-OX) 400 (240 Mg) MG tablet Take 400 mg by mouth daily. 04/07/23   [provider]  metoprolol  (TOPROL -XL) 200 MG 24 hr tablet Take 1 tablet (200 mg total) by mouth daily. 03/07/23   Sebastian Toribio GAILS, MD  nitroGLYCERIN  (NITROSTAT ) 0.4 MG SL tablet Place 0.4 mg under the tongue every 5 (five) minutes as needed for chest pain.  [provider]  ondansetron  (ZOFRAN ) 4 MG tablet Take 1 tablet (4 mg total) by mouth every 8 (eight) hours as needed for nausea or vomiting. 05/26/23   Keith, Kayla N, PA-C  oxyCODONE -acetaminophen  (PERCOCET) 5-325 MG tablet Take 1-2 tablets by mouth every 4 (four) hours as needed. Patient not taking: Reported on 06/15/2023 06/09/23   Haze Lonni PARAS, MD  pantoprazole  (PROTONIX ) 40 MG tablet Take 1 tablet (40 mg total) by mouth 2 (two) times daily. 05/26/23   Keith, Kayla N, PA-C  pregabalin  (LYRICA ) 100 MG capsule Take 100 mg by mouth 2 (two) times daily. 02/26/23   [provider]  Simethicone  (GAS-X PO) Take 1 capsule by mouth 2 (two) times daily as needed (gas).    [provider]  sucralfate  (CARAFATE ) 1 GM/10ML suspension Take 10 mLs (1 g total) by mouth 4 (four) times daily -  with meals and at bedtime. 05/26/23   Francis Ileana SAILOR, PA-C  tamsulosin  (FLOMAX ) 0.4 MG CAPS capsule Take 1 capsule (0.4 mg total) by mouth daily. Patient  not taking: Reported on 05/26/2023 03/08/23   Sebastian Toribio GAILS, MD  amitriptyline  (ELAVIL ) 25 MG tablet Take 1 tablet (25 mg total) by mouth at bedtime. Patient not taking: Reported on 08/08/2019 10/15/18 08/08/19  Tobie Yetta HERO, MD    Physical Exam:  Vitals:   06/18/23 1100 06/18/23 1200 06/18/23 1321 06/18/23 1400  BP: (!) 178/106 (!) 170/119  (!) 160/108  Pulse: (!) 105 (!) 106  (!) 104  Resp: 18 18  18   Temp:   98.6 F (37 C)   TempSrc:   Oral   SpO2: 100% 100%  100%   Wt Readings from Last 3 Encounters:  06/14/23 59 kg  06/08/23 56.7 kg  05/26/23 60.4 kg   There is no height or weight on file to calculate BMI.  General:  Average built, not in obvious distress, thinly built, in mild distress.  Mildly anxious HENT: Normocephalic, No scleral pallor or icterus noted. Oral mucosa is moist.  Chest:  Clear breath sounds.  . No crackles or wheezes.  CVS: S1 &S2 heard. No murmur.  Regular rate and rhythm. Abdomen: Soft, tenderness over the epigastric region on palpation, nondistended.  Bowel sounds are heard. No abdominal mass palpated Extremities: No cyanosis, clubbing or edema.  Peripheral pulses are palpable. Psych: Alert, awake and oriented, anxious CNS:  No cranial nerve deficits.  Power equal in all extremities.   Skin: Warm and dry.  No rashes noted.  Labs on Admission:   CBC: Recent Labs  Lab 06/14/23 2230 06/18/23 0948  WBC 5.7 7.9  NEUTROABS 1.9  --   HGB 11.4* 11.1*  HCT 35.7* 34.2*  MCV 87.7 86.4  PLT 182 191    Basic Metabolic Panel: Recent Labs  Lab 06/14/23 2230 06/18/23 0948  NA 139 136  K 4.3 4.6  CL 111 104  CO2 14* 12*  GLUCOSE 87 78  BUN 11 7  CREATININE 0.91 0.99  CALCIUM  7.8* 7.4*    Liver Function Tests: Recent Labs  Lab 06/14/23 2230 06/18/23 0948  AST 55* 104*  ALT 26 42  ALKPHOS 87 89  BILITOT 0.7 1.1  PROT 7.3 7.7  ALBUMIN  3.5 4.0   Recent Labs  Lab 06/14/23 2230 06/18/23 0948  LIPASE 18 19   No results for  input(s): AMMONIA in the last 168 hours.  Cardiac Enzymes: No results for input(s): CKTOTAL, CKMB, CKMBINDEX, TROPONINI in the last 168 hours.  BNP (last  3 results) No results for input(s): BNP in the last 8760 hours.  ProBNP (last 3 results) No results for input(s): PROBNP in the last 8760 hours.  CBG: No results for input(s): GLUCAP in the last 168 hours.  Lipase     Component Value Date/Time   LIPASE 19 06/18/2023 0948     Urinalysis    Component Value Date/Time   COLORURINE YELLOW 06/18/2023 1115   APPEARANCEUR CLEAR 06/18/2023 1115   LABSPEC 1.013 06/18/2023 1115   PHURINE 5.0 06/18/2023 1115   GLUCOSEU NEGATIVE 06/18/2023 1115   HGBUR NEGATIVE 06/18/2023 1115   HGBUR negative 04/30/2010 1020   BILIRUBINUR NEGATIVE 06/18/2023 1115   BILIRUBINUR negative 09/06/2019 1649   KETONESUR NEGATIVE 06/18/2023 1115   PROTEINUR NEGATIVE 06/18/2023 1115   UROBILINOGEN 0.2 09/06/2019 1649   UROBILINOGEN 0.2 03/15/2015 0508   NITRITE NEGATIVE 06/18/2023 1115   LEUKOCYTESUR NEGATIVE 06/18/2023 1115     Drugs of Abuse     Component Value Date/Time   LABOPIA POSITIVE (A) 06/18/2023 1115   COCAINSCRNUR POSITIVE (A) 06/18/2023 1115   COCAINSCRNUR Positive (A) 02/22/2023 0220   COCAINSCRNUR NEGATIVE 05/05/2009 1507   LABBENZ NONE DETECTED 06/18/2023 1115   LABBENZ NEGATIVE 05/05/2009 1507   AMPHETMU NONE DETECTED 06/18/2023 1115   THCU POSITIVE (A) 06/18/2023 1115   LABBARB NONE DETECTED 06/18/2023 1115      Radiological Exams on Admission: No results found.  EKG: Personally reviewed by me which shows sinus tachycardia   Consultant: None  Code Status: Full code  Microbiology none  Antibiotics: None  Family Communication:  Patients' condition and plan of care including tests being ordered have been discussed with the patient  who indicate understanding and agree with the plan.   Status is: Observation The patient remains OBS appropriate and will  d/c before 2 midnights.   Severity of Illness: The appropriate patient status for this patient is OBSERVATION. Observation status is judged to be reasonable and necessary in order to provide the required intensity of service to ensure the patient's safety. The patient's presenting symptoms, physical exam findings, and initial radiographic and laboratory data in the context of their medical condition is felt to place them at decreased risk for further clinical deterioration. Furthermore, it is anticipated that the patient will be medically stable for discharge from the hospital within 2 midnights of admission.   Signed, Vernal Alstrom, MD [facility] 06/18/2023

## 2023-06-18 NOTE — ED Provider Notes (Signed)
 [facility] Provider Note   CSN: [id number] Arrival date & time: 06/18/23  9071     History  Chief Complaint  Patient presents with   Abdominal Pain    [name] is a 53 y.o. male.  Pt with epigastric pain in past two days. Pain constant, dull, non radiating, without consistent/specific exacerbating or alleviating factors. No fever or chills. +nausea/vomiting, no bloody or bilious emesis. Having regular bms. No abd distension. No dysuria or hematuria. Notes hx chronic pancreatitis. Indicates compliant w home meds, including acid blocker therapy. Indicates occasional etoh use. No chest pain or sob. Occasional non prod cough. Mild nasal congestion. No specific known ill contacts.   The history is provided by the patient, medical records and the EMS personnel. The history is limited by the condition of the patient.  Abdominal Pain Associated symptoms: nausea and vomiting   Associated symptoms: no chest pain, no dysuria, no fever, no shortness of breath and no sore throat        Home Medications Prior to Admission medications   Medication Sig Start Date End Date Taking? Authorizing Provider  amoxicillin  (AMOXIL ) 875 MG tablet Take 875 mg by mouth 2 (two) times daily. 06/09/23   [provider]  bismuth  subsalicylate (KAOPECTATE) 262 MG/15ML suspension Take 15 mLs by mouth every 6 (six) hours as needed for indigestion or diarrhea or loose stools.    [provider]  diltiazem  (TIAZAC ) 120 MG 24 hr capsule Take 1 capsule by mouth in the morning and at bedtime. Patient not taking: Reported on 05/26/2023 04/13/23 07/12/23  [provider]  fluticasone  (FLONASE ) 50 MCG/ACT nasal spray Place 2 sprays into both nostrils daily as needed for allergies.    [provider]  folic acid  (FOLVITE ) 1 MG tablet Take 1 tablet (1 mg total) by mouth daily. 09/21/22   Pokhrel, Laxman, MD  HYDROcodone -acetaminophen   (NORCO/VICODIN) 5-325 MG tablet Take 1 tablet by mouth 4 (four) times daily. 06/10/23   [provider]  hydrocortisone  cream 1 % Apply topically as needed for itching. 03/07/23   Sebastian Toribio GAILS, MD  lipase/protease/amylase (CREON ) 36000 UNITS CPEP capsule Take 2 capsules (72,000 Units total) by mouth 3 (three) times daily with meals. May also take 1 capsule (36,000 Units total) as needed (with snacks). Patient taking differently: Take 2 capsules (72,000 Units total) by mouth for breakfast and dinner , then take 1 capsule for lunch. May also take 1 capsule (36,000 Units total) as needed (with snacks). 01/26/23   Cindy Garnette POUR, MD  liver oil-zinc  oxide (DESITIN) 40 % ointment Apply topically 2 (two) times daily. Apply to buttocks Patient taking differently: Apply 1 Application topically as needed for irritation. Apply to buttocks 03/07/23   Sebastian Toribio GAILS, MD  loperamide  (IMODIUM ) 2 MG capsule Take 1 capsule (2 mg total) by mouth as needed for diarrhea or loose stools. Patient not taking: Reported on 05/26/2023 03/07/23   Sebastian Toribio GAILS, MD  magic mouthwash SOLN Take 10 mLs by mouth 4 (four) times daily. 06/10/23   [provider]  magnesium  oxide (MAG-OX) 400 (240 Mg) MG tablet Take 400 mg by mouth daily. 04/07/23   [provider]  metoprolol  (TOPROL -XL) 200 MG 24 hr tablet Take 1 tablet (200 mg total) by mouth daily. 03/07/23   Sebastian Toribio GAILS, MD  nitroGLYCERIN  (NITROSTAT ) 0.4 MG SL tablet Place 0.4 mg under the tongue every 5 (five) minutes as needed for chest pain.  [provider]  ondansetron  (ZOFRAN ) 4 MG tablet Take 1 tablet (4 mg total) by mouth every 8 (eight) hours as needed for nausea or vomiting. 05/26/23   Keith, Kayla N, PA-C  oxyCODONE -acetaminophen  (PERCOCET) 5-325 MG tablet Take 1-2 tablets by mouth every 4 (four) hours as needed. Patient not taking: Reported on 06/15/2023 06/09/23   Haze Lonni PARAS, MD  pantoprazole  (PROTONIX ) 40  MG tablet Take 1 tablet (40 mg total) by mouth 2 (two) times daily. 05/26/23   Keith, Kayla N, PA-C  pregabalin  (LYRICA ) 100 MG capsule Take 100 mg by mouth 2 (two) times daily. 02/26/23   [provider]  Simethicone  (GAS-X PO) Take 1 capsule by mouth 2 (two) times daily as needed (gas).    [provider]  sucralfate  (CARAFATE ) 1 GM/10ML suspension Take 10 mLs (1 g total) by mouth 4 (four) times daily -  with meals and at bedtime. 05/26/23   Francis Ileana SAILOR, PA-C  tamsulosin  (FLOMAX ) 0.4 MG CAPS capsule Take 1 capsule (0.4 mg total) by mouth daily. Patient not taking: Reported on 05/26/2023 03/08/23   Sebastian Toribio GAILS, MD  amitriptyline  (ELAVIL ) 25 MG tablet Take 1 tablet (25 mg total) by mouth at bedtime. Patient not taking: Reported on 08/08/2019 10/15/18 08/08/19  Tobie Yetta HERO, MD      Allergies    Robaxin  [methocarbamol ], Aspirin , Shellfish-derived products, Trazodone  and nefazodone, Adhesive [tape], Fish-derived products, Latex, Toradol  [ketorolac  tromethamine ], Contrast media [iodinated contrast media], Reglan  [metoclopramide ], and Garnell gums oil]    Review of Systems   Review of Systems  Constitutional:  Negative for fever.  HENT:  Negative for sore throat.   Eyes:  Negative for redness.  Respiratory:  Negative for shortness of breath.   Cardiovascular:  Negative for chest pain.  Gastrointestinal:  Positive for abdominal pain, nausea and vomiting.  Genitourinary:  Negative for dysuria and flank pain.  Musculoskeletal:  Negative for back pain and neck pain.  Skin:  Negative for rash.  Neurological:  Negative for headaches.    Physical Exam Updated Vital Signs BP (!) 170/119   Pulse (!) 106   Temp 98.6 F (37 C) (Oral)   Resp 18   SpO2 100%  Physical Exam Vitals and nursing note reviewed.  Constitutional:      Appearance: Normal appearance. He is well-developed.  HENT:     Head: Atraumatic.     Nose: Nose normal.     Mouth/Throat:     Mouth: Mucous  membranes are moist.     Pharynx: Oropharynx is clear.  Eyes:     General: No scleral icterus.    Conjunctiva/sclera: Conjunctivae normal.     Pupils: Pupils are equal, round, and reactive to light.  Neck:     Trachea: No tracheal deviation.  Cardiovascular:     Rate and Rhythm: Normal rate and regular rhythm.     Pulses: Normal pulses.     Heart sounds: Normal heart sounds. No murmur heard.    No friction rub. No gallop.  Pulmonary:     Effort: Pulmonary effort is normal. No accessory muscle usage or respiratory distress.     Breath sounds: Normal breath sounds.  Abdominal:     General: Bowel sounds are normal. There is no distension.     Palpations: Abdomen is soft. There is no mass.     Tenderness: There is abdominal tenderness. There is no guarding.     Comments: Epigastric tenderness.   Genitourinary:    Comments: No  cva tenderness. Musculoskeletal:        General: No swelling or tenderness.     Cervical back: Normal range of motion and neck supple. No rigidity.     Right lower leg: No edema.     Left lower leg: No edema.  Skin:    General: Skin is warm and dry.     Findings: No rash.  Neurological:     Mental Status: He is alert.     Comments: Alert, speech clear. Motor/sens grossly intact bil.   Psychiatric:        Mood and Affect: Mood normal.     ED Results / Procedures / Treatments   Labs (all labs ordered are listed, but only abnormal results are displayed) Results for orders placed or performed during the hospital encounter of 06/18/23  Resp panel by RT-PCR (RSV, Flu A&B, Covid) Anterior Nasal Swab   Collection Time: 06/18/23  9:41 AM   Specimen: Anterior Nasal Swab  Result Value Ref Range   SARS Coronavirus 2 by RT PCR NEGATIVE NEGATIVE   Influenza A by PCR NEGATIVE NEGATIVE   Influenza B by PCR NEGATIVE NEGATIVE   Resp Syncytial Virus by PCR NEGATIVE NEGATIVE  CBC   Collection Time: 06/18/23  9:48 AM  Result Value Ref Range   WBC 7.9 4.0 - 10.5 K/uL    RBC 3.96 (L) 4.22 - 5.81 MIL/uL   Hemoglobin 11.1 (L) 13.0 - 17.0 g/dL   HCT 65.7 (L) 60.9 - 47.9 %   MCV 86.4 80.0 - 100.0 fL   MCH 28.0 26.0 - 34.0 pg   MCHC 32.5 30.0 - 36.0 g/dL   RDW 78.4 (H) 88.4 - 84.4 %   Platelets 191 150 - 400 K/uL   nRBC 0.0 0.0 - 0.2 %  Comprehensive metabolic panel   Collection Time: 06/18/23  9:48 AM  Result Value Ref Range   Sodium 136 135 - 145 mmol/L   Potassium 4.6 3.5 - 5.1 mmol/L   Chloride 104 98 - 111 mmol/L   CO2 12 (L) 22 - 32 mmol/L   Glucose, Bld 78 70 - 99 mg/dL   BUN 7 6 - 20 mg/dL   Creatinine, Ser 9.00 0.61 - 1.24 mg/dL   Calcium  7.4 (L) 8.9 - 10.3 mg/dL   Total Protein 7.7 6.5 - 8.1 g/dL   Albumin  4.0 3.5 - 5.0 g/dL   AST 895 (H) 15 - 41 U/L   ALT 42 0 - 44 U/L   Alkaline Phosphatase 89 38 - 126 U/L   Total Bilirubin 1.1 0.0 - 1.2 mg/dL   GFR, Estimated >39 >39 mL/min   Anion gap 20 (H) 5 - 15  Lipase, blood   Collection Time: 06/18/23  9:48 AM  Result Value Ref Range   Lipase 19 11 - 51 U/L  Ethanol   Collection Time: 06/18/23  9:48 AM  Result Value Ref Range   Alcohol , Ethyl (B) 104 (H) <10 mg/dL  Rapid urine drug screen (hospital performed)   Collection Time: 06/18/23 11:15 AM  Result Value Ref Range   Opiates POSITIVE (A) NONE DETECTED   Cocaine POSITIVE (A) NONE DETECTED   Benzodiazepines NONE DETECTED NONE DETECTED   Amphetamines NONE DETECTED NONE DETECTED   Tetrahydrocannabinol POSITIVE (A) NONE DETECTED   Barbiturates NONE DETECTED NONE DETECTED  Urinalysis, Routine w reflex microscopic -Urine, Clean Catch   Collection Time: 06/18/23 11:15 AM  Result Value Ref Range   Color, Urine YELLOW YELLOW   APPearance CLEAR CLEAR  Specific Gravity, Urine 1.013 1.005 - 1.030   pH 5.0 5.0 - 8.0   Glucose, UA NEGATIVE NEGATIVE mg/dL   Hgb urine dipstick NEGATIVE NEGATIVE   Bilirubin Urine NEGATIVE NEGATIVE   Ketones, ur NEGATIVE NEGATIVE mg/dL   Protein, ur NEGATIVE NEGATIVE mg/dL   Nitrite NEGATIVE NEGATIVE    Leukocytes,Ua NEGATIVE NEGATIVE   CT ABDOMEN PELVIS WO CONTRAST Result Date: 06/15/2023 CLINICAL DATA:  Acute severe pancreatitis. EXAM: CT ABDOMEN AND PELVIS WITHOUT CONTRAST TECHNIQUE: Multidetector CT imaging of the abdomen and pelvis was performed following the standard protocol without IV contrast. RADIATION DOSE REDUCTION: This exam was performed according to the departmental dose-optimization program which includes automated exposure control, adjustment of the mA and/or kV according to patient size and/or use of iterative reconstruction technique. COMPARISON:  CT abdomen and pelvis 06/08/2023. MRI abdomen 06/08/2023. FINDINGS: Lower chest: Lung bases are clear. Hepatobiliary: No focal liver abnormality is seen. No gallstones, gallbladder wall thickening, or biliary dilatation. Pancreas: Calcifications in the head and body of the pancreas consistent with chronic pancreatitis. 4.2 cm diameter cystic lesion in the body of the pancreas and an additional smaller cystic lesions in the tail of the pancreas measuring up to 1 cm diameter in the tail of the pancreas. These lesions are unchanged since prior study. Lesions could represent pseudocyst or mucinous neoplasm such as IPMN. No edema or infiltration in the peripancreatic fat. Spleen: Normal in size without focal abnormality. Adrenals/Urinary Tract: Adrenal glands are unremarkable. Kidneys are normal, without renal calculi, focal lesion, or hydronephrosis. Bladder wall is diffusely thickened, possibly indicating cystitis. Correlate with urinalysis. Stomach/Bowel: Under distention limits evaluation but there appears to be diffuse gastric wall thickening possibly gastritis or other infiltrating process. Menetrier's disease or neoplastic processes can have this appearance. Similar appearance to prior study. Small bowel and colon are not abnormally distended. No wall thickening or inflammatory stranding identified. Appendix is normal. Vascular/Lymphatic: Aortic  atherosclerosis. No enlarged abdominal or pelvic lymph nodes. Reproductive: Prostate is unremarkable. Other: No abdominal wall hernia or abnormality. No abdominopelvic ascites. Musculoskeletal: No acute or significant osseous findings. IMPRESSION: 1. Pancreatic calcification consistent with chronic pancreatitis. 2. Stable appearance of pancreatic cystic lesions measuring up to 4.2 cm diameter, possibly pseudocyst or IPMN. 3. Diffuse gastric wall thickening similar to prior studies. Likely gastritis but other etiologies are not excluded. 4. Aortic atherosclerosis. 5. Mildly thickened bladder wall possibly cystitis. Correlate with urinalysis. Electronically Signed   By: Elsie Gravely M.D.   On: 06/15/2023 01:52   DG Chest Portable 1 View Result Date: 06/14/2023 CLINICAL DATA:  Chest pain EXAM: PORTABLE CHEST 1 VIEW COMPARISON:  05/26/2023 FINDINGS: The heart size and mediastinal contours are within normal limits. Both lungs are clear. The visualized skeletal structures are unremarkable. IMPRESSION: No active disease. Electronically Signed   By: Luke Bun M.D.   On: 06/14/2023 22:51   MR ABDOMEN MRCP W WO CONTAST Result Date: 06/12/2023 CLINICAL DATA:  Alcohol  induced pancreatitis, pseudocyst EXAM: MRI ABDOMEN WITHOUT AND WITH CONTRAST (INCLUDING MRCP) TECHNIQUE: Multiplanar multisequence MR imaging of the abdomen was performed both before and after the administration of intravenous contrast. Heavily T2-weighted images of the biliary and pancreatic ducts were obtained, and three-dimensional MRCP images were rendered by post processing. CONTRAST:  6mL GADAVIST  GADOBUTROL  1 MMOL/ML IV SOLN COMPARISON:  CT abdomen pelvis, 05/26/2023 02/27/2023 FINDINGS: Lower chest: No acute abnormality. Hepatobiliary: No solid liver abnormality is seen. No gallstones, gallbladder wall thickening, or biliary dilatation. Pancreas: Diffusely atrophic pancreas with areas of susceptibility  artifact in keeping with calcification  identified by prior CT. Unchanged fluid signal cystic lesions in the pancreatic tail, largest and most proximal measuring 4.1 x 3.6 cm (series 23, image 13). Mild, unchanged prominence of the pancreatic duct, measuring up to 0.4 cm centrally (series 23, image 15). No acute inflammatory findings. Spleen: Normal in size without significant abnormality. Adrenals/Urinary Tract: Adrenal glands are unremarkable. Kidneys are normal, without renal calculi, solid lesion, or hydronephrosis. Stomach/Bowel: Stomach is within normal limits. No evidence of bowel wall thickening, distention, or inflammatory changes. Vascular/Lymphatic: Aortic atherosclerosis. No enlarged abdominal lymph nodes. Other: No abdominal wall hernia or abnormality. No ascites. Musculoskeletal: No acute or significant osseous findings. IMPRESSION: 1. Diffusely atrophic pancreas with areas of susceptibility artifact in keeping with calcification identified by prior CT consistent with stigmata of chronic pancreatitis. 2. 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 Mild, unchanged prominence of the pancreatic duct, measuring up to 0.4 cm centrally. 3. No acute inflammatory findings in the abdomen. Electronically Signed   By: Marolyn JONETTA Jaksch M.D.   On: 06/12/2023 22:04   MR 3D Recon At Scanner Result Date: 06/12/2023 CLINICAL DATA:  Alcohol  induced pancreatitis, pseudocyst EXAM: MRI ABDOMEN WITHOUT AND WITH CONTRAST (INCLUDING MRCP) TECHNIQUE: Multiplanar multisequence MR imaging of the abdomen was performed both before and after the administration of intravenous contrast. Heavily T2-weighted images of the biliary and pancreatic ducts were obtained, and three-dimensional MRCP images were rendered by post processing. CONTRAST:  6mL GADAVIST  GADOBUTROL  1 MMOL/ML IV SOLN COMPARISON:  CT abdomen pelvis, 05/26/2023 02/27/2023 FINDINGS: Lower chest: No acute abnormality. Hepatobiliary:  No solid liver abnormality is seen. No gallstones, gallbladder wall thickening, or biliary dilatation. Pancreas: Diffusely atrophic pancreas with areas of susceptibility artifact in keeping with calcification identified by prior CT. Unchanged fluid signal cystic lesions in the pancreatic tail, largest and most proximal measuring 4.1 x 3.6 cm (series 23, image 13). Mild, unchanged prominence of the pancreatic duct, measuring up to 0.4 cm centrally (series 23, image 15). No acute inflammatory findings. Spleen: Normal in size without significant abnormality. Adrenals/Urinary Tract: Adrenal glands are unremarkable. Kidneys are normal, without renal calculi, solid lesion, or hydronephrosis. Stomach/Bowel: Stomach is within normal limits. No evidence of bowel wall thickening, distention, or inflammatory changes. Vascular/Lymphatic: Aortic atherosclerosis. No enlarged abdominal lymph nodes. Other: No abdominal wall hernia or abnormality. No ascites. Musculoskeletal: No acute or significant osseous findings. IMPRESSION: 1. Diffusely atrophic pancreas with areas of susceptibility artifact in keeping with calcification identified by prior CT consistent with stigmata of chronic pancreatitis. 2. 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 Mild, unchanged prominence of the pancreatic duct, measuring up to 0.4 cm centrally. 3. No acute inflammatory findings in the abdomen. Electronically Signed   By: Marolyn JONETTA Jaksch M.D.   On: 06/12/2023 22:04   CT ABDOMEN PELVIS WO CONTRAST Result Date: 06/08/2023 CLINICAL DATA:  Epigastric pain EXAM: CT ABDOMEN AND PELVIS WITHOUT CONTRAST TECHNIQUE: Multidetector CT imaging of the abdomen and pelvis was performed following the standard protocol without IV contrast. RADIATION DOSE REDUCTION: This exam was performed according to the departmental dose-optimization program which includes automated exposure control,  adjustment of the mA and/or kV according to patient size and/or use of iterative reconstruction technique. COMPARISON:  CT abdomen and pelvis 05/26/2023. FINDINGS: Lower chest: No acute abnormality. Hepatobiliary: No focal liver abnormality is seen. No gallstones, gallbladder wall thickening,  or biliary dilatation. Pancreas: Pancreatic calcifications are again seen compatible with chronic pancreatitis. No acute inflammation identified. Three cystic areas are again noted in the pancreas, unchanged from prior. The largest is in the body measuring 4.0 by 3.3 cm. Spleen: Normal in size without focal abnormality. Adrenals/Urinary Tract: Mild diffuse bladder wall thickening present. Kidneys and adrenal glands appear within normal limits. Stomach/Bowel: There are findings suspicious for diffuse gastric wall thickening, particularly along the greater curvature. The stomach is nondilated. Appendix appears normal. No evidence of bowel wall thickening, distention, or inflammatory changes. There are scattered air-fluid levels throughout small bowel loops. Vascular/Lymphatic: Aortic atherosclerosis. No enlarged abdominal or pelvic lymph nodes. Reproductive: Prostate is unremarkable. Other: No abdominal wall hernia or abnormality. No abdominopelvic ascites. Musculoskeletal: There are degenerative changes at L5-S1. IMPRESSION: 1. Findings suspicious for diffuse gastric wall thickening, particularly along the greater curvature. Findings may represent gastritis. 2. Scattered air-fluid levels throughout small bowel loops, nonspecific but can be seen in the setting of enteritis. 3. Stable cystic areas in the pancreas. Findings may represent pseudocysts or IPMNs. 4. Mild diffuse bladder wall thickening. Correlate clinically for cystitis. 5. Aortic atherosclerosis. Aortic Atherosclerosis (ICD10-I70.0). Electronically Signed   By: Greig Pique M.D.   On: 06/08/2023 21:12   CT ABDOMEN PELVIS WO CONTRAST Result Date:  05/26/2023 CLINICAL DATA:  Acute, severe abdominal pain, nonlocalized. Chronic pancreatitis. EXAM: CT ABDOMEN AND PELVIS WITHOUT CONTRAST TECHNIQUE: Multidetector CT imaging of the abdomen and pelvis was performed following the standard protocol without IV contrast. RADIATION DOSE REDUCTION: This exam was performed according to the departmental dose-optimization program which includes automated exposure control, adjustment of the mA and/or kV according to patient size and/or use of iterative reconstruction technique. COMPARISON:  CT abdomen pelvis 04/05/2023 and 02/27/2023 FINDINGS: Lower chest: The lung bases are clear. Lack of intra-articular fluid limits evaluation of the abdominal and pelvic organ parenchyma. The following findings are made within this limitation. Hepatobiliary: Smooth liver contours. No gross liver lesion is seen. Within the limitations of motion artifact, no gross gallbladder abnormality is seen. Pancreas: There are again scattered diffuse chronic calcifications within the pancreatic body and head, the sequela of chronic pancreatitis. A fluid density cyst with thin walls within the measuring up to approximately 3.1 x 3.9 x 3.4 cm (transverse by AP by craniocaudal). When measured in a similar manner, this is mildly decreased in size from 3.5 x 4.0 x 3.7 cm on 04/05/2023 CT. A 9 mm low-density cyst within the tail of the pancreas appears mildly decreased in size from 12 mm on 04/05/2023. There is also a small low-attenuation lesion in between these two cysts, at the junction of the body and tail of the pancreas (axial series 2, image 20) that appears slightly smaller than on 04/05/2023. No surrounding peripancreatic inflammatory fat stranding is seen. Spleen: Normal in size without focal abnormality. Adrenals/Urinary Tract: Normal adrenals. No renal stone or hydronephrosis. Within the limitations of lack of IV contrast, no contour deforming renal mass. The urinary bladder is grossly  unremarkable. Stomach/Bowel: No bowel wall thickening. The terminal ileum is unremarkable. The appendix is within normal limits (axial series 3, image 28). No dilated loops of bowel to indicate bowel obstruction. Vascular/Lymphatic: No abdominal aortic aneurysm. Moderate to high-grade atherosclerotic calcifications within the distal abdominal aorta and bilateral iliac arteries. No enlarged abdominal or pelvic lymph nodes. Reproductive: The prostate and seminal vesicles are grossly unremarkable. Other: Small fat containing umbilical hernia. No free air or free fluid. Musculoskeletal: Moderate to severe L5-S1  degenerative disc and endplate changes. IMPRESSION: 1. No acute abnormality is seen within the abdomen or pelvis. 2. Redemonstration of sequela of chronic pancreatitis. No CT evidence of acute pancreatitis. 3. 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 4. Moderate to high-grade atherosclerotic calcifications within the distal abdominal aorta and bilateral iliac arteries. Aortic Atherosclerosis (ICD10-I70.0). Electronically Signed   By: Tanda Lyons M.D.   On: 05/26/2023 16:59   DG Chest Portable 1 View Result Date: 05/26/2023 CLINICAL DATA:  Shortness of breath, chest pain. EXAM: PORTABLE CHEST 1 VIEW COMPARISON:  April 05, 2023. FINDINGS: The heart size and mediastinal contours are within normal limits. Both lungs are clear. The visualized skeletal structures are unremarkable. IMPRESSION: No active disease. Electronically Signed   By: Lynwood Landy Raddle M.D.   On: 05/26/2023 15:14     EKG EKG Interpretation Date/Time:  Sunday June 18 2023 09:36:28 EST Ventricular Rate:  121 PR Interval:  90 QRS Duration:  93 QT Interval:  335 QTC Calculation: 476 R Axis:   79  Text Interpretation: Sinus tachycardia Nonspecific T wave abnormality Borderline prolonged QT interval Confirmed by Bernard Drivers  ([id number]) on 06/18/2023 10:51:24 AM  Radiology No results found.  Procedures Procedures    Medications Ordered in ED Medications  lactated ringers  bolus 1,000 mL (0 mLs Intravenous Stopped 06/18/23 1110)  pantoprazole  (PROTONIX ) injection 80 mg (80 mg Intravenous Given 06/18/23 0952)  morphine  (PF) 4 MG/ML injection 4 mg (4 mg Intravenous Given 06/18/23 0955)  ondansetron  (ZOFRAN ) injection 4 mg (4 mg Intravenous Given 06/18/23 0954)  lactated ringers  bolus 1,000 mL (0 mLs Intravenous Stopped 06/18/23 1229)  morphine  (PF) 4 MG/ML injection 4 mg (4 mg Intravenous Given 06/18/23 1203)  labetalol  (NORMODYNE ) injection 20 mg (20 mg Intravenous Given 06/18/23 1202)  alum & mag hydroxide-simeth (MAALOX/MYLANTA) 200-200-20 MG/5ML suspension 30 mL (30 mLs Oral Given 06/18/23 1430)  famotidine  (PEPCID ) tablet 20 mg (20 mg Oral Given 06/18/23 1430)  acetaminophen  (TYLENOL ) tablet 1,000 mg (1,000 mg Oral Given 06/18/23 1429)  morphine  (PF) 2 MG/ML injection 2 mg (2 mg Intravenous Given 06/18/23 1430)    ED Course/ Medical Decision Making/ A&P                                 Medical Decision Making Problems Addressed: Alcohol -induced chronic pancreatitis (HCC): chronic illness or injury with exacerbation, progression, or side effects of treatment that poses a threat to life or bodily functions Cannabinoid hyperemesis syndrome: acute illness or injury with systemic symptoms Cocaine use: acute illness or injury with systemic symptoms that poses a threat to life or bodily functions    Details: Acute/chronic Elevated blood pressure reading: acute illness or injury Epigastric pain: acute illness or injury that poses a threat to life or bodily functions Essential hypertension: chronic illness or injury with exacerbation, progression, or side effects of treatment that poses a threat to life or bodily functions Intractable vomiting: acute illness or injury with systemic symptoms that poses a threat to life or bodily  functions Marijuana use: acute illness or injury with systemic symptoms    Details: Acute/chronic Nausea and vomiting in adult: acute illness or injury with systemic symptoms that poses a threat to life or bodily functions Uncontrolled hypertension: acute illness or injury  Amount and/or Complexity of Data Reviewed Independent Historian: EMS    Details:  hx External Data Reviewed: notes. Labs: ordered. Decision-making details documented in ED Course. Radiology: independent interpretation performed. ECG/medicine tests: ordered and independent interpretation performed. Decision-making details documented in ED Course. Discussion of management or test interpretation with external provider(s): medicine  Risk OTC drugs. Prescription drug management. Parenteral controlled substances. Decision regarding hospitalization.   Iv ns. Continuous pulse ox and cardiac monitoring. Labs ordered/sent.   Differential diagnosis includes pancreatitis, pud, gastritis, etc. Dispo decision including potential need for admission considered - will get labs  and reassess.   Reviewed nursing notes and prior charts for additional history. External reports reviewed. Additional history from: EMS.  LR bolus. Morphine  iv. Zofran  iv.   Cardiac monitor: sinus rhythm, rate 110.   Labs reviewed/interpreted by me - hco3 low, c/w volume depletion/dehydration. Wbc normal. Hct 34. Uds +cocaine, thc.   Recent ct imaging reviewed/interpreted by me - no sbo. Chr panc.  Multiple rounds meds, ivf.   Trial of po.  Pt w recurrent emesis. Not bloody or bilious. Recheck abd soft non tender.   Medicine consulted for admission re dehydration, intractable nv, uncontrolled htn, etc (?whether related to chronic pancreatitis, chr etoh use disorder, CHS, gastroenteritis, gastritis, etc.)           Final Clinical Impression(s) / ED Diagnoses Final diagnoses:  Epigastric pain  Alcohol -induced chronic pancreatitis (HCC)   Elevated blood pressure reading  Essential hypertension  Nausea and vomiting in adult  Cocaine use    Rx / DC Orders ED Discharge Orders     None         Bernard Drivers, MD 06/18/23 1457

## 2023-06-18 NOTE — Discharge Instructions (Addendum)
 It was our pleasure to provide your ER care today - we hope that you feel better.  Drink plenty of fluids/stay well hydrated. Continue your acid blocker medication. Avoid any alcohol  use. Take zofran  as need for nausea. Take acetaminophen  as need for pain. You may also take ultram  as need for pain - no driving when taking.   Follow up closely with primary care doctor in the next 2-3 days if symptoms fail to improve/resolve.  Your blood pressure is high today - take your meds as prescribed, limit salt intake, and follow up closely with primary care doctor in 1-2 weeks.   Note that increasingly we are seeing a recurrent abdominal pain and/or vomiting syndrome called Cannabinoid Hyperemesis Syndrome - see attached info - in these cases, avoiding marijuana use will prevent symptoms from recurring (note that symptoms can persist for a few weeks if history of heavy marijuana use as it can take time to get out of system).  Return to ER if worse, new symptoms, fevers, new or worsening or severe abdominal pain, persistent vomiting, new or worsening or severe abdominal pain, weak/fainting, or other concern. .   You were given pain meds in the ER - no driving for the next 8 hours.                    Intensive Outpatient Programs  [facility]    [facility] [address]     [address] [place],  [place]     [place], [place] [phone]      [phone]  [name]   [facility]  (Inpatient and outpatient)  [phone] (Suboxone and Methadone) [address]           [phone]           [facility]: Alcohol  & Drug Services    [facility] - Intensive Outpatient [address]     [address] [place], [place] [address]     [place], [place]  [phone]      [phone]  [facility] (Outpatient, Inpatient, [facility] (Groups and Residental) (insurance only)  [phone]    [place], [place]          [phone]       [facility]    [facility] (for caregivers and family) [address]     [address] [place], [place]     [place], [place] [phone]      [phone]  Residential Treatment Programs  [facility]  Work Farm(2 years) Residential: 90 days)  [facility] ([facility] [address] [facility]      [address] [place], [place]     [place], [place] [phone]      [phone] or [phone]  [facility]    [facility] [address]      [address] [place], [place]     [place], [place] [phone]      [phone]  [facility]   [facility] (RTS) [address]     [address] [place], [place] [address]     [place], [place] [phone]      [phone] Admissions: 8am-3pm M-F  [facility]: Residential Program (90 Days)              [facility]: [facility]  [place], [place]     [place], [place]  [phone] or [phone]    (Walk in Hours over the weekend or by referral)   [facility]: [facility]:[phone] (for crisis response 24 hours a day)        Outpatient Psychiatry and Counseling  [facility]: Mobile Crisis Management:  [phone]  [facility]         [address] [place], [place] [address] [phone]  [facility] [employer] sliding scale fee and walk in schedule: M-F 8am-12pm/1pm-3pm [address] [place], [place] [address] [phone]  [name] Outpatient Services/ Intensive Outpatient Therapy Program [address] [place], [place] [address] [phone]  [facility]   [facility] [address]   [address] [place], [place]  [address]    [place], [place] [address] [phone]     [phone]  [facility] [address]  [address] [place] [address]    [place] [address] [phone]     [phone]  Ima Anon, MD    [facility] [address]    [address] [place] [address]    [place] [address] [phone]       [phone]  [facility]   [facility] [address]   [address] [place], [place] [phone]     [phone]  [facility]    [facility] [phone] [place], [place]                  [address] [phone]     [place], [place] [address] [phone] [facility]: (Spanish speakin) [phone]  [facility]    [facility] [address]    [address] [place], [place] [address]    [place], [place] [address] [phone]     [phone]

## 2023-06-18 NOTE — ED Triage Notes (Signed)
 [facility] reports pt coming from home c/o epigastric pain that started at 0300 today. [facility], n/v/d. Pt states he ran out of his oxycodone .

## 2023-06-19 DIAGNOSIS — R1013 Epigastric pain: Secondary | ICD-10-CM | POA: Diagnosis present

## 2023-06-19 DIAGNOSIS — K852 Alcohol induced acute pancreatitis without necrosis or infection: Secondary | ICD-10-CM | POA: Diagnosis present

## 2023-06-19 DIAGNOSIS — F419 Anxiety disorder, unspecified: Secondary | ICD-10-CM | POA: Diagnosis present

## 2023-06-19 DIAGNOSIS — E7800 Pure hypercholesterolemia, unspecified: Secondary | ICD-10-CM | POA: Diagnosis present

## 2023-06-19 DIAGNOSIS — F1210 Cannabis abuse, uncomplicated: Secondary | ICD-10-CM | POA: Diagnosis present

## 2023-06-19 DIAGNOSIS — F1410 Cocaine abuse, uncomplicated: Secondary | ICD-10-CM | POA: Diagnosis present

## 2023-06-19 DIAGNOSIS — Z8711 Personal history of peptic ulcer disease: Secondary | ICD-10-CM | POA: Diagnosis not present

## 2023-06-19 DIAGNOSIS — Z8249 Family history of ischemic heart disease and other diseases of the circulatory system: Secondary | ICD-10-CM | POA: Diagnosis not present

## 2023-06-19 DIAGNOSIS — I10 Essential (primary) hypertension: Secondary | ICD-10-CM | POA: Diagnosis present

## 2023-06-19 DIAGNOSIS — F17210 Nicotine dependence, cigarettes, uncomplicated: Secondary | ICD-10-CM | POA: Diagnosis present

## 2023-06-19 DIAGNOSIS — E872 Acidosis, unspecified: Secondary | ICD-10-CM | POA: Diagnosis present

## 2023-06-19 DIAGNOSIS — K219 Gastro-esophageal reflux disease without esophagitis: Secondary | ICD-10-CM | POA: Diagnosis present

## 2023-06-19 DIAGNOSIS — R112 Nausea with vomiting, unspecified: Secondary | ICD-10-CM | POA: Diagnosis not present

## 2023-06-19 DIAGNOSIS — F4310 Post-traumatic stress disorder, unspecified: Secondary | ICD-10-CM | POA: Diagnosis present

## 2023-06-19 DIAGNOSIS — Y905 Blood alcohol level of 100-119 mg/100 ml: Secondary | ICD-10-CM | POA: Diagnosis present

## 2023-06-19 DIAGNOSIS — I480 Paroxysmal atrial fibrillation: Secondary | ICD-10-CM | POA: Diagnosis present

## 2023-06-19 DIAGNOSIS — Z79899 Other long term (current) drug therapy: Secondary | ICD-10-CM | POA: Diagnosis not present

## 2023-06-19 DIAGNOSIS — F1010 Alcohol abuse, uncomplicated: Secondary | ICD-10-CM | POA: Diagnosis present

## 2023-06-19 DIAGNOSIS — A0811 Acute gastroenteropathy due to Norwalk agent: Secondary | ICD-10-CM | POA: Diagnosis present

## 2023-06-19 DIAGNOSIS — F319 Bipolar disorder, unspecified: Secondary | ICD-10-CM | POA: Diagnosis present

## 2023-06-19 DIAGNOSIS — K860 Alcohol-induced chronic pancreatitis: Secondary | ICD-10-CM | POA: Diagnosis present

## 2023-06-19 DIAGNOSIS — Z1152 Encounter for screening for COVID-19: Secondary | ICD-10-CM | POA: Diagnosis not present

## 2023-06-19 DIAGNOSIS — D573 Sickle-cell trait: Secondary | ICD-10-CM | POA: Diagnosis present

## 2023-06-19 LAB — CBC
HCT: 35.5 % — ABNORMAL LOW (ref 39.0–52.0)
Hemoglobin: 11.4 g/dL — ABNORMAL LOW (ref 13.0–17.0)
MCH: 28.6 pg (ref 26.0–34.0)
MCHC: 32.1 g/dL (ref 30.0–36.0)
MCV: 89 fL (ref 80.0–100.0)
Platelets: 164 10*3/uL (ref 150–400)
RBC: 3.99 MIL/uL — ABNORMAL LOW (ref 4.22–5.81)
RDW: 21.7 % — ABNORMAL HIGH (ref 11.5–15.5)
WBC: 8.7 10*3/uL (ref 4.0–10.5)
nRBC: 0.2 % (ref 0.0–0.2)

## 2023-06-19 LAB — COMPREHENSIVE METABOLIC PANEL
ALT: 27 U/L (ref 0–44)
AST: 55 U/L — ABNORMAL HIGH (ref 15–41)
Albumin: 2.9 g/dL — ABNORMAL LOW (ref 3.5–5.0)
Alkaline Phosphatase: 64 U/L (ref 38–126)
Anion gap: 11 (ref 5–15)
BUN: 6 mg/dL (ref 6–20)
CO2: 18 mmol/L — ABNORMAL LOW (ref 22–32)
Calcium: 6.8 mg/dL — ABNORMAL LOW (ref 8.9–10.3)
Chloride: 110 mmol/L (ref 98–111)
Creatinine, Ser: 0.82 mg/dL (ref 0.61–1.24)
GFR, Estimated: >60 mL/min (ref >60)
Glucose, Bld: 72 mg/dL (ref 70–99)
Potassium: 4.9 mmol/L (ref 3.5–5.1)
Sodium: 139 mmol/L (ref 135–145)
Total Bilirubin: 1 mg/dL (ref 0.0–1.2)
Total Protein: 5.9 g/dL — ABNORMAL LOW (ref 6.5–8.1)

## 2023-06-19 LAB — C DIFFICILE QUICK SCREEN W PCR REFLEX
C Diff antigen: NEGATIVE
C Diff interpretation: NOT DETECTED
C Diff toxin: NEGATIVE

## 2023-06-19 LAB — MAGNESIUM: Magnesium: 1 mg/dL — ABNORMAL LOW (ref 1.7–2.4)

## 2023-06-19 LAB — PROTIME-INR
INR: 1 (ref 0.8–1.2)
Prothrombin Time: 13.7 s (ref 11.4–15.2)

## 2023-06-19 LAB — PHOSPHORUS: Phosphorus: 4.5 mg/dL (ref 2.5–4.6)

## 2023-06-19 MED ORDER — LIDOCAINE VISCOUS HCL 2 % MT SOLN
15.0000 mL | OROMUCOSAL | Status: DC | PRN
  Filled 2023-06-19: qty 15

## 2023-06-19 NOTE — ED Notes (Signed)
 Fall risk bracelet placed, pt refused fall risk socks.

## 2023-06-19 NOTE — Progress Notes (Signed)
 Patient advised on no smoking policy, patient window open, ashes on and around toilet. Patient has visitor in room and states he was smoking, visitor sates patient was smoking. Reinforced no smoking at hospital.   Rick Chard, RN]

## 2023-06-19 NOTE — Progress Notes (Signed)
 PROGRESS NOTE  [name] WUJ:[id number] DOB: [birth year] DOA: 06/18/2023 PCP: Marius Siemens, NP   LOS: 0 days   Brief narrative:   Patient is a 53 years old male with past medical history of chronic pancreatitis, polysubstance abuse, bipolar disorder, GERD presented to hospital with epigastric pain for last 2 days with nausea and loose stools.  In the ED, patient was noted to have normal CBC.  Chemistry showed lipase of 20.  Creatinine 0.9.  Urine drug screen positive for opiates cocaine and THC.  Urinalysis was negative for acute infection.  EKG done in the ED showed sinus tachycardia.  Patient was then admitted hospital for further evaluation and treatment.  Assessment/Plan: Principal Problem:   Intractable nausea and vomiting Active Problems:   Abdominal pain   Acute on chronic pancreatitis (HCC)   Paroxysmal atrial fibrillation (HCC)   Metabolic acidosis, increased anion gap   Alcohol  abuse  Intractable nausea and vomiting likely secondary to polysubstance abuse, cannabis abuse.  Continue IV hydration antiemetics analgesics.  Was on clears.  Will advance to full liquids today.  Continue CIWA protocol, nicotine  patch.  Counseled against polysubstance abuse.  Will get TOC involvement.  Patient questing lidocaine  viscous. Loose stools.  Will check C. difficile and GI pathogen panel today.  Takes Imodium  at home.  If negative will restart Imodium .   Abdominal pain.  Likely secondary to chronic pancreatitis.  Counseled against polysubstance abuse and alcohol .  Put the patient on CIWA protocol, and dicyclomine .  Analgesics.   History of alcohol  consumption with chronic pancreatitis.  Continue supportive care including hydration, antiemetics, analgesics, high risk for withdrawal.  CIWA protocol.   History of GERD.  Will continue with Protonix .   DVT prophylaxis: enoxaparin  (LOVENOX ) injection 40 mg Start: 06/18/23 1600   Disposition: Home likely in 1 to 2 days  Status  is: Observation The patient will require care spanning > 2 midnights and should be moved to inpatient because: Polysubstance abuse, high risk for withdrawal, CIWA protocol, diarrhea, pending clinical improvement,    Code Status:     Code Status: Full Code  Family Communication: None at bedside  Consultants: None  Procedures: None  Anti-infectives:  None  Anti-infectives (From admission, onward)    None        Subjective: Today, patient was seen and examined at bedside.  Patient states that he wishes to try something more to eat.  Has epigastric discomfort.  Has been having some bowel movements and wishes to have lidocaine  viscous.  Objective: Vitals:   06/19/23 0722 06/19/23 0810  BP: (!) 150/107 (!) 163/103  Pulse: 62 (!) 123  Resp: 17 19  Temp: 98 F (36.7 C)   SpO2: 100% 90%   No intake or output data in the 24 hours ending 06/19/23 1136 There were no vitals filed for this visit. There is no height or weight on file to calculate BMI.   Physical Exam:  GENERAL: Patient is alert awake and oriented. Not in obvious distress.  Thinly built, more cooperative than yesterday. HENT: No scleral pallor or icterus. Pupils equally reactive to light. Oral mucosa is moist NECK: is supple, no gross swelling noted. CHEST: Clear to auscultation. No crackles or wheezes.  Diminished breath sounds bilaterally. CVS: S1 and S2 heard, no murmur. Regular rate and rhythm.  ABDOMEN: Soft, nonspecific tenderness on palpation of the epigastric region.  Bowel sounds are present. EXTREMITIES: No edema. CNS: Cranial nerves are intact. No focal motor deficits. SKIN: warm and dry  without rashes.  Data Review: I have personally reviewed the following laboratory data and studies,  CBC: Recent Labs  Lab 06/14/23 2230 06/18/23 0948 06/19/23 0511  WBC 5.7 7.9 8.7  NEUTROABS 1.9  --   --   HGB 11.4* 11.1* 11.4*  HCT 35.7* 34.2* 35.5*  MCV 87.7 86.4 89.0  PLT 182 191 164   Basic  Metabolic Panel: Recent Labs  Lab 06/14/23 2230 06/18/23 0948 06/19/23 0704  NA 139 136 139  K 4.3 4.6 4.9  CL 111 104 110  CO2 14* 12* 18*  GLUCOSE 87 78 72  BUN 11 7 6   CREATININE 0.91 0.99 0.82  CALCIUM  7.8* 7.4* 6.8*  MG  --   --  1.0*  PHOS  --   --  4.5   Liver Function Tests: Recent Labs  Lab 06/14/23 2230 06/18/23 0948 06/19/23 0704  AST 55* 104* 55*  ALT 26 42 27  ALKPHOS 87 89 64  BILITOT 0.7 1.1 1.0  PROT 7.3 7.7 5.9*  ALBUMIN  3.5 4.0 2.9*   Recent Labs  Lab 06/14/23 2230 06/18/23 0948  LIPASE 18 19   No results for input(s): "AMMONIA" in the last 168 hours. Cardiac Enzymes: No results for input(s): "CKTOTAL", "CKMB", "CKMBINDEX", "TROPONINI" in the last 168 hours. BNP (last 3 results) No results for input(s): "BNP" in the last 8760 hours.  ProBNP (last 3 results) No results for input(s): "PROBNP" in the last 8760 hours.  CBG: No results for input(s): "GLUCAP" in the last 168 hours. Recent Results (from the past 240 hours)  Resp panel by RT-PCR (RSV, Flu A&B, Covid) Anterior Nasal Swab     Status: None   Collection Time: 06/18/23  9:41 AM   Specimen: Anterior Nasal Swab  Result Value Ref Range Status   SARS Coronavirus 2 by RT PCR NEGATIVE NEGATIVE Final   Influenza A by PCR NEGATIVE NEGATIVE Final   Influenza B by PCR NEGATIVE NEGATIVE Final    Comment: (NOTE) The Xpert Xpress SARS-CoV-2/FLU/RSV plus assay is intended as an aid in the diagnosis of influenza from Nasopharyngeal swab specimens and should not be used as a sole basis for treatment. Nasal washings and aspirates are unacceptable for Xpert Xpress SARS-CoV-2/FLU/RSV testing.  Fact Sheet for Patients: BloggerCourse.com  Fact Sheet for Healthcare Providers: SeriousBroker.it  This test is not yet approved or cleared by the [place]  FDA and has been authorized for detection and/or diagnosis of SARS-CoV-2 by FDA under an  Emergency Use Authorization (EUA). This EUA will remain in effect (meaning this test can be used) for the duration of the COVID-19 declaration under Section 564(b)(1) of the Act, 21 U.S.C. section 360bbb-3(b)(1), unless the authorization is terminated or revoked.     Resp Syncytial Virus by PCR NEGATIVE NEGATIVE Final    Comment: (NOTE) Fact Sheet for Patients: BloggerCourse.com  Fact Sheet for Healthcare Providers: SeriousBroker.it  This test is not yet approved or cleared by the [place]  FDA and has been authorized for detection and/or diagnosis of SARS-CoV-2 by FDA under an Emergency Use Authorization (EUA). This EUA will remain in effect (meaning this test can be used) for the duration of the COVID-19 declaration under Section 564(b)(1) of the Act, 21 U.S.C. section 360bbb-3(b)(1), unless the authorization is terminated or revoked.  Performed at [facility], [address], [place], [place] [address]      Studies: No results found.    Lesean Woolverton, MD  [facility] 06/19/2023  If 7PM-7AM, please contact  night-coverage

## 2023-06-19 NOTE — TOC CM/SW Note (Signed)
 Transition of Care [facility]) - Inpatient Brief Assessment   Patient Details  Name: Lemon View MRN: [id number] Date of Birth: Mar 27, 1971  [facility]) CM/SW Contact:    Tom-Johnson, Angelique Ken, RN Phone Number: 06/19/2023, 4:12 PM   Clinical Narrative:  Patient presented to the ED with Abdominal pain, Nausea and Diarrhea.  Patient has hx of Substance Abuse, Bipolar Disorder, Chronic Pancreatitis and GERD.   No TOC needs or recommendations noted at this time.  Patient not Medically ready for discharge.  CM will continue to follow as patient progresses with care towards discharge.                Transition of Care Asessment: Insurance and Status: Insurance coverage has been reviewed Patient has primary care physician: Yes Home environment has been reviewed: Yes Prior level of function:: Independent Prior/Current Home Services: No current home services Social Drivers of Health Review: SDOH reviewed no interventions necessary Readmission risk has been reviewed: Yes Transition of care needs: transition of care needs identified, TOC will continue to follow (Substance Abuse)

## 2023-06-19 NOTE — Hospital Course (Signed)
 Patient is a 53 years old male with past medical history of chronic pancreatitis, polysubstance abuse, bipolar disorder, GERD presented to hospital with epigastric pain for last 2 days with nausea and loose stools.  In the ED patient was noted to have normal CBC.  Chemistry showed lipase of 20.  Creatinine 0.9.  Urine drug screen positive for opiates cocaine and THC.  Urinalysis was negative for acute infection.  EKG done in the ED showed sinus tachycardia.  Patient was then admitted hospital for further evaluation and treatment.     Intractable nausea and vomiting likely secondary to polysubstance abuse, cannabis abuse.  Continue IV hydration antiemetics analgesics.  Was on clears.  Will advance to full liquids today.  Continue CIWA protocol, nicotine  patch.  Counseled against polysubstance abuse.  Will get TOC involvement.   Abdominal pain.  Likely secondary to chronic pancreatitis.  Counseled against polysubstance abuse and alcohol .  Put the patient on CIWA protocol, and dicyclomine .  Analgesics.   History of alcohol  consumption with chronic pancreatitis.  Continue supportive care including hydration, antiemetics, analgesics, high risk for withdrawal.  CIWA protocol.   History of GERD.  Will continue with Protonix .

## 2023-06-19 NOTE — Progress Notes (Signed)
 Tech alerted this Clinical research associate that she smelled smoke in pt. Room, upon entering patient room, pt. Exited bathroom and a strong odor of cigarette smoke was noted, pt. Educated about [place] smoking policy and verbalized understanding, pt. Handed over cigarettes and lighter, MD made aware  Elvina Hammers

## 2023-06-19 NOTE — Plan of Care (Signed)
   Problem: Education: Goal: Knowledge of General Education information will improve Description Including pain rating scale, medication(s)/side effects and non-pharmacologic comfort measures Outcome: Progressing

## 2023-06-20 DIAGNOSIS — R112 Nausea with vomiting, unspecified: Secondary | ICD-10-CM | POA: Diagnosis not present

## 2023-06-20 LAB — CBC
HCT: 31.5 % — ABNORMAL LOW (ref 39.0–52.0)
Hemoglobin: 9.9 g/dL — ABNORMAL LOW (ref 13.0–17.0)
MCH: 28.3 pg (ref 26.0–34.0)
MCHC: 31.4 g/dL (ref 30.0–36.0)
MCV: 90 fL (ref 80.0–100.0)
Platelets: 176 10*3/uL (ref 150–400)
RBC: 3.5 MIL/uL — ABNORMAL LOW (ref 4.22–5.81)
RDW: 22 % — ABNORMAL HIGH (ref 11.5–15.5)
WBC: 8.2 10*3/uL (ref 4.0–10.5)
nRBC: 0.4 % — ABNORMAL HIGH (ref 0.0–0.2)

## 2023-06-20 LAB — GASTROINTESTINAL PANEL BY PCR, STOOL (REPLACES STOOL CULTURE)

## 2023-06-20 LAB — COMPREHENSIVE METABOLIC PANEL
ALT: 35 U/L (ref 0–44)
AST: 77 U/L — ABNORMAL HIGH (ref 15–41)
Albumin: 3.3 g/dL — ABNORMAL LOW (ref 3.5–5.0)
Alkaline Phosphatase: 66 U/L (ref 38–126)
Anion gap: 13 (ref 5–15)
BUN: 5 mg/dL — ABNORMAL LOW (ref 6–20)
CO2: 18 mmol/L — ABNORMAL LOW (ref 22–32)
Calcium: 7.1 mg/dL — ABNORMAL LOW (ref 8.9–10.3)
Chloride: 108 mmol/L (ref 98–111)
Creatinine, Ser: 1.02 mg/dL (ref 0.61–1.24)
GFR, Estimated: >60 mL/min (ref >60)
Glucose, Bld: 142 mg/dL — ABNORMAL HIGH (ref 70–99)
Potassium: 4.3 mmol/L (ref 3.5–5.1)
Sodium: 139 mmol/L (ref 135–145)
Total Bilirubin: 0.8 mg/dL (ref 0.0–1.2)
Total Protein: 6.4 g/dL — ABNORMAL LOW (ref 6.5–8.1)

## 2023-06-20 LAB — PHOSPHORUS: Phosphorus: 4.4 mg/dL (ref 2.5–4.6)

## 2023-06-20 LAB — MAGNESIUM: Magnesium: 0.9 mg/dL — CL (ref 1.7–2.4)

## 2023-06-20 MED ORDER — MAGNESIUM SULFATE 4 GM/100ML IV SOLN
4.0000 g | Freq: Once | INTRAVENOUS | Status: AC
  Administered 2023-06-20: 4 g via INTRAVENOUS
  Filled 2023-06-20: qty 100

## 2023-06-20 MED ORDER — DICYCLOMINE HCL 10 MG PO CAPS: 10.0000 mg | ORAL_CAPSULE | Freq: Three times a day (TID) | ORAL | 0 refills | Status: DC

## 2023-06-20 MED ORDER — LIDOCAINE VISCOUS HCL 2 % MT SOLN: 15.0000 mL | OROMUCOSAL | 0 refills | Status: DC | PRN

## 2023-06-20 MED ORDER — NICOTINE 21 MG/24HR TD PT24: 21.0000 mg | MEDICATED_PATCH | Freq: Every day | TRANSDERMAL | 0 refills | Status: DC

## 2023-06-20 MED ORDER — MAGNESIUM SULFATE 2 GM/50ML IV SOLN
2.0000 g | Freq: Once | INTRAVENOUS | Status: DC
Start: 2023-06-20 — End: 2023-06-20
  Filled 2023-06-20: qty 50

## 2023-06-20 MED ORDER — OXYCODONE-ACETAMINOPHEN 5-325 MG PO TABS
1.0000 | ORAL_TABLET | Freq: Four times a day (QID) | ORAL | 0 refills | Status: DC | PRN
Start: 2023-06-20 — End: 2023-08-21

## 2023-06-20 MED ORDER — MAGNESIUM OXIDE -MG SUPPLEMENT 400 (240 MG) MG PO TABS
400.0000 mg | ORAL_TABLET | Freq: Two times a day (BID) | ORAL | Status: DC
  Administered 2023-06-20: 400 mg via ORAL
  Filled 2023-06-20: qty 1
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
## 2023-06-20 NOTE — Plan of Care (Signed)

## 2023-06-20 NOTE — TOC Transition Note (Signed)
Transition of Care [facility]) - Discharge Note   Patient Details  Name: [name] MRN: [id number] Date of Birth: 07/04/1970  Transition of Care [facility]) CM/SW Contact:  Tom-Johnson, Hershal Coria, RN Phone Number: 06/20/2023, 12:52 PM   Clinical Narrative:     CM informed by Charge Nurse that patient was escorted out of the building by [employer]. Patient has a discharge order in and refuses to leave the unit.   No further TOC needs noted.       Patient Goals and CMS Choice            Discharge Placement                       Discharge Plan and Services Additional resources added to the After Visit Summary for                                       Social Drivers of Health (SDOH) Interventions SDOH Screenings   Food Insecurity: No Food Insecurity (06/20/2023)  Housing: Patient Declined (06/20/2023)  Transportation Needs: No Transportation Needs (06/20/2023)  Utilities: Not At Risk (06/20/2023)  Alcohol Screen: Medium Risk (03/14/2017)  Depression (PHQ2-9): Medium Risk (03/27/2020)  Tobacco Use: High Risk (06/18/2023)     Readmission Risk Interventions    06/19/2023    4:11 PM 03/07/2023    4:21 PM 01/10/2023    4:32 PM  Readmission Risk Prevention Plan  Transportation Screening Complete Complete Complete  Medication Review (RN Care Manager) Referral to Pharmacy Complete Complete  PCP or Specialist appointment within 3-5 days of discharge Complete Complete Complete  HRI or Home Care Consult Complete Complete Complete  SW Recovery Care/Counseling Consult Complete Complete Complete  Palliative Care Screening Not Applicable Not Applicable Not Applicable  Skilled Nursing Facility Not Applicable Not Applicable Not Applicable

## 2023-06-20 NOTE — Progress Notes (Incomplete)
Patient verbally abusive,cussing at RN because the bed alarm went off when he got up to go to the bathroom. Patient was a high fall risk because he was given ativan,explained to him the reason but he was

## 2023-06-20 NOTE — Plan of Care (Signed)
Problem: Clinical Measurements: Goal: Respiratory complications will improve Outcome: Progressing   Problem: Activity: Goal: Risk for activity intolerance will decrease Outcome: Progressing

## 2023-06-20 NOTE — Progress Notes (Signed)
   06/20/23 0405  Provider Notification  Provider Name/Title Opyd,MD  Date Provider Notified 06/20/23  Time Provider Notified 0405  Method of Notification Page  Notification Reason Critical Result ((+) for Norovirus)  Provider response No new orders

## 2023-06-20 NOTE — Progress Notes (Signed)
Patient took telemetry off and refused to be put back on it. He was cursing at [employer]. Opyd,MD made aware.

## 2023-06-20 NOTE — Discharge Summary (Signed)
Physician Discharge Summary  [name] GNF:[id number] DOB: 12-Jan-1971 DOA: 06/18/2023  PCP: Grayce Sessions, NP  Admit date: 06/18/2023 Discharge date: 06/20/2023  Admitted From: Home  Discharge disposition: Home   Recommendations for Outpatient Follow-Up:   Follow up with your primary care provider in one week.  Check CBC, BMP, magnesium in the next visit Follow-up with pain management clinic as outpatient. Patient should be encouraged to quit illicit substances alcohol smoking  Discharge Diagnosis:   Principal Problem:   Intractable nausea and vomiting Active Problems:   Abdominal pain   Acute on chronic pancreatitis (HCC)   Paroxysmal atrial fibrillation (HCC)   Metabolic acidosis, increased anion gap   Alcohol abuse   Polysubstance abuse (tobacco, cocaine, THC, and ETOH)   Discharge Condition: Improved.  Diet recommendation: Soft, low-fat diet  Wound care: None.  Code status: Full.   History of Present Illness:   Patient is a 53 years old male with past medical history of chronic pancreatitis, polysubstance abuse, bipolar disorder, GERD presented to hospital with epigastric pain for last 2 days with nausea and loose stools.  In the ED, patient was noted to have normal CBC.  Chemistry showed lipase of 20.  Creatinine 0.9.  Urine drug screen positive for opiates cocaine and THC.  Urinalysis was negative for acute infection.  EKG done in the ED showed sinus tachycardia.  Patient was then admitted hospital for further evaluation and treatment.   Hospital Course:   Following conditions were addressed during hospitalization as listed below,  Intractable nausea and vomiting likely secondary to polysubstance abuse, cannabis abuse.   Patient also tested positive for norovirus.  Has improved at this time.  Will continue supportive care at home.  Increase oral hydration.    Loose stools.  Secondary to norovirus.  Has Imodium at home.   Abdominal pain.   Likely secondary to chronic pancreatitis.  Counseled against polysubstance abuse and alcohol.  Received CIWA protocol during hospitalization.  No obvious signs of withdrawal.  History of alcohol consumption with chronic pancreatitis.   Continue IV hydration, antiemetics, analgesics.  Improved at this time.  Counseling done.     History of GERD. Continue Protonix.  Disposition.  At this time, patient is stable for disposition home with outpatient PCP follow-up.  Medical Consultants:   None.  Procedures:    None Subjective:   Today, patient was seen and examined at bedside.  Denies any nausea, vomiting fever chills or rigor.  Has 1 loose stool today.  Tolerating oral diet some.  Discharge Exam:   Vitals:   06/20/23 0800 06/20/23 0846  BP:  (!) 124/91  Pulse:  84  Resp: 19 18  Temp:  98.2 F (36.8 C)  SpO2:     Vitals:   06/20/23 0500 06/20/23 0608 06/20/23 0800 06/20/23 0846  BP:  101/72  (!) 124/91  Pulse:  67  84  Resp:  18 19 18   Temp:  98.1 F (36.7 C)  98.2 F (36.8 C)  TempSrc:      SpO2:  97%    Weight: 59.5 kg      Body mass index is 19.94 kg/m.   General: Alert awake, not in obvious distress, thinly built HENT: pupils equally reacting to light,  No scleral pallor or icterus noted. Oral mucosa is moist.  Chest:  Clear breath sounds.  . No crackles or wheezes.  CVS: S1 &S2 heard. No murmur.  Regular rate and rhythm. Abdomen: Soft, n nonspecific tenderness noted  over the abdomen., nondistended.  Bowel sounds are heard.   Extremities: No cyanosis, clubbing or edema.  Peripheral pulses are palpable. Psych: Alert, awake and oriented, normal mood CNS:  No cranial nerve deficits.  Power equal in all extremities.   Skin: Warm and dry.  No rashes noted.  The results of significant diagnostics from this hospitalization (including imaging, microbiology, ancillary and laboratory) are listed below for reference.     Diagnostic Studies:   No results  found.   Labs:   Basic Metabolic Panel: Recent Labs  Lab 06/14/23 2230 06/18/23 0948 06/19/23 0704  NA 139 136 139  K 4.3 4.6 4.9  CL 111 104 110  CO2 14* 12* 18*  GLUCOSE 87 78 72  BUN 11 7 6   CREATININE 0.91 0.99 0.82  CALCIUM 7.8* 7.4* 6.8*  MG  --   --  1.0*  PHOS  --   --  4.5   GFR Estimated Creatinine Clearance: 87.7 mL/min (by C-G formula based on SCr of 0.82 mg/dL). Liver Function Tests: Recent Labs  Lab 06/14/23 2230 06/18/23 0948 06/19/23 0704  AST 55* 104* 55*  ALT 26 42 27  ALKPHOS 87 89 64  BILITOT 0.7 1.1 1.0  PROT 7.3 7.7 5.9*  ALBUMIN 3.5 4.0 2.9*   Recent Labs  Lab 06/14/23 2230 06/18/23 0948  LIPASE 18 19   No results for input(s): "AMMONIA" in the last 168 hours. Coagulation profile Recent Labs  Lab 06/19/23 0704  INR 1.0    CBC: Recent Labs  Lab 06/14/23 2230 06/18/23 0948 06/19/23 0511 06/20/23 0817  WBC 5.7 7.9 8.7 8.2  NEUTROABS 1.9  --   --   --   HGB 11.4* 11.1* 11.4* 9.9*  HCT 35.7* 34.2* 35.5* 31.5*  MCV 87.7 86.4 89.0 90.0  PLT 182 191 164 176   Cardiac Enzymes: No results for input(s): "CKTOTAL", "CKMB", "CKMBINDEX", "TROPONINI" in the last 168 hours. BNP: Invalid input(s): "POCBNP" CBG: No results for input(s): "GLUCAP" in the last 168 hours. D-Dimer No results for input(s): "DDIMER" in the last 72 hours. Hgb A1c No results for input(s): "HGBA1C" in the last 72 hours. Lipid Profile No results for input(s): "CHOL", "HDL", "LDLCALC", "TRIG", "CHOLHDL", "LDLDIRECT" in the last 72 hours. Thyroid function studies No results for input(s): "TSH", "T4TOTAL", "T3FREE", "THYROIDAB" in the last 72 hours.  Invalid input(s): "FREET3" Anemia work up No results for input(s): "VITAMINB12", "FOLATE", "FERRITIN", "TIBC", "IRON", "RETICCTPCT" in the last 72 hours. Microbiology Recent Results (from the past 240 hours)  Resp panel by RT-PCR (RSV, Flu A&B, Covid) Anterior Nasal Swab     Status: None   Collection Time:  06/18/23  9:41 AM   Specimen: Anterior Nasal Swab  Result Value Ref Range Status   SARS Coronavirus 2 by RT PCR NEGATIVE NEGATIVE Final   Influenza A by PCR NEGATIVE NEGATIVE Final   Influenza B by PCR NEGATIVE NEGATIVE Final    Comment: (NOTE) The Xpert Xpress SARS-CoV-2/FLU/RSV plus assay is intended as an aid in the diagnosis of influenza from Nasopharyngeal swab specimens and should not be used as a sole basis for treatment. Nasal washings and aspirates are unacceptable for Xpert Xpress SARS-CoV-2/FLU/RSV testing.  Fact Sheet for Patients: BloggerCourse.com  Fact Sheet for Healthcare Providers: SeriousBroker.it  This test is not yet approved or cleared by the [place] FDA and has been authorized for detection and/or diagnosis of SARS-CoV-2 by FDA under an Emergency Use Authorization (EUA). This EUA will remain in effect (meaning this  test can be used) for the duration of the COVID-19 declaration under Section 564(b)(1) of the Act, 21 U.S.C. section 360bbb-3(b)(1), unless the authorization is terminated or revoked.     Resp Syncytial Virus by PCR NEGATIVE NEGATIVE Final    Comment: (NOTE) Fact Sheet for Patients: BloggerCourse.com  Fact Sheet for Healthcare Providers: SeriousBroker.it  This test is not yet approved or cleared by the [place] FDA and has been authorized for detection and/or diagnosis of SARS-CoV-2 by FDA under an Emergency Use Authorization (EUA). This EUA will remain in effect (meaning this test can be used) for the duration of the COVID-19 declaration under Section 564(b)(1) of the Act, 21 U.S.C. section 360bbb-3(b)(1), unless the authorization is terminated or revoked.  Performed at [facility], [address], [place], [place] [address]   C Difficile Quick Screen w PCR reflex     Status: None   Collection Time: 06/19/23 12:53 PM    Specimen: STOOL  Result Value Ref Range Status   C Diff antigen NEGATIVE NEGATIVE Final   C Diff toxin NEGATIVE NEGATIVE Final   C Diff interpretation No C. difficile detected.  Final    Comment: Performed at [facility], [address], [place], [place] [address]  Gastrointestinal Panel by PCR , Stool     Status: Abnormal   Collection Time: 06/19/23 12:53 PM   Specimen: STOOL  Result Value Ref Range Status   Campylobacter species NOT DETECTED NOT DETECTED Final   Plesimonas shigelloides NOT DETECTED NOT DETECTED Final   Salmonella species NOT DETECTED NOT DETECTED Final   Yersinia enterocolitica NOT DETECTED NOT DETECTED Final   Vibrio species NOT DETECTED NOT DETECTED Final   Vibrio cholerae NOT DETECTED NOT DETECTED Final   Enteroaggregative E coli (EAEC) NOT DETECTED NOT DETECTED Final   Enteropathogenic E coli (EPEC) NOT DETECTED NOT DETECTED Final   Enterotoxigenic E coli (ETEC) NOT DETECTED NOT DETECTED Final   Shiga like toxin producing E coli (STEC) NOT DETECTED NOT DETECTED Final   Shigella/Enteroinvasive E coli (EIEC) NOT DETECTED NOT DETECTED Final   Cryptosporidium NOT DETECTED NOT DETECTED Final   Cyclospora cayetanensis NOT DETECTED NOT DETECTED Final   Entamoeba histolytica NOT DETECTED NOT DETECTED Final   Giardia lamblia NOT DETECTED NOT DETECTED Final   Adenovirus F40/41 NOT DETECTED NOT DETECTED Final   Astrovirus NOT DETECTED NOT DETECTED Final   Norovirus GI/GII DETECTED (A) NOT DETECTED Final    Comment: RESULT CALLED TO, READ BACK BY AND VERIFIED WITH:  CHARITO BOKIAGON AT 0400 06/20/23 JG    Rotavirus A NOT DETECTED NOT DETECTED Final   Sapovirus (I, II, IV, and V) NOT DETECTED NOT DETECTED Final    Comment: Performed at [facility], [address] [place], [place] [address]     Discharge Instructions:   Discharge Instructions     Call MD for:  persistant nausea and vomiting   Complete by: As directed    Call MD for:  severe  uncontrolled pain   Complete by: As directed    Call MD for:  temperature >100.4   Complete by: As directed    Diet - low sodium heart healthy   Complete by: As directed    Discharge instructions   Complete by: As directed    Follow-up with your primary care provider in 1 week.  Check blood work at that time.  Please do not smoke drink or use illicit substances.  Increase hydration.  Follow-up with your  pain management clinic.   Increase activity slowly   Complete by: As directed       Allergies as of 06/20/2023       Reactions   Robaxin [methocarbamol] Other (See Comments)   jumpy limbs   Aspirin Other (See Comments)   Unknown reaction   Shellfish-derived Products Nausea And Vomiting, Rash   Trazodone And Nefazodone Other (See Comments)   Muscle spasms   Adhesive [tape] Itching   Fish-derived Products    Latex Itching   Toradol [ketorolac Tromethamine] Other (See Comments)   Has ulcers; cannot have this   Contrast Media [iodinated Contrast Media] Hives   Reglan [metoclopramide] Other (See Comments)   Muscle spasms   Salmon [fish Oil] Nausea And Vomiting, Rash        Medication List     STOP taking these medications    amoxicillin 875 MG tablet Commonly known as: AMOXIL   diltiazem 120 MG 24 hr capsule Commonly known as: TIAZAC   HYDROcodone-acetaminophen 5-325 MG tablet Commonly known as: NORCO/VICODIN   hydrocortisone cream 1 %   tamsulosin 0.4 MG Caps capsule Commonly known as: FLOMAX       TAKE these medications    dicyclomine 10 MG capsule Commonly known as: BENTYL Take 1 capsule (10 mg total) by mouth 4 (four) times daily -  before meals and at bedtime for 5 days.   fluticasone 50 MCG/ACT nasal spray Commonly known as: FLONASE Place 2 sprays into both nostrils daily as needed for allergies.   folic acid 1 MG tablet Commonly known as: FOLVITE Take 1 tablet (1 mg total) by mouth daily.   GAS-X PO Take 1 capsule by mouth 2 (two) times daily  as needed (gas).   Kaopectate 262 MG/15ML suspension Generic drug: bismuth subsalicylate Take 15 mLs by mouth every 6 (six) hours as needed for indigestion or diarrhea or loose stools.   lidocaine 2 % solution Commonly known as: XYLOCAINE Use as directed 15 mLs in the mouth or throat every 4 (four) hours as needed for mouth pain.   lipase/protease/amylase [address] UNITS Cpep capsule Commonly known as: Creon Take 2 capsules (72,000 Units total) by mouth 3 (three) times daily with meals. May also take 1 capsule (36,000 Units total) as needed (with snacks). What changed: See the new instructions.   liver oil-zinc oxide 40 % ointment Commonly known as: DESITIN Apply topically 2 (two) times daily. Apply to buttocks What changed:  how much to take when to take this reasons to take this   loperamide 2 MG capsule Commonly known as: IMODIUM Take 1 capsule (2 mg total) by mouth as needed for diarrhea or loose stools.   magic mouthwash Soln Take 10 mLs by mouth 4 (four) times daily.   magnesium oxide 400 (240 Mg) MG tablet Commonly known as: MAG-OX Take 400 mg by mouth daily.   metoprolol 200 MG 24 hr tablet Commonly known as: TOPROL-XL Take 1 tablet (200 mg total) by mouth daily.   nicotine 21 mg/24hr patch Commonly known as: NICODERM CQ - dosed in mg/24 hours Place 1 patch (21 mg total) onto the skin daily.   nitroGLYCERIN 0.4 MG SL tablet Commonly known as: NITROSTAT Place 0.4 mg under the tongue every 5 (five) minutes as needed for chest pain.   ondansetron 4 MG tablet Commonly known as: ZOFRAN Take 1 tablet (4 mg total) by mouth every 8 (eight) hours as needed for nausea or vomiting.   oxyCODONE-acetaminophen 5-325 MG tablet Commonly known  as: Percocet Take 1 tablet by mouth every 6 (six) hours as needed for moderate pain (pain score 4-6) or severe pain (pain score 7-10). What changed:  how much to take when to take this reasons to take this   pantoprazole 40 MG  tablet Commonly known as: Protonix Take 1 tablet (40 mg total) by mouth 2 (two) times daily.   pregabalin 100 MG capsule Commonly known as: LYRICA Take 100 mg by mouth 2 (two) times daily.   sucralfate 1 GM/10ML suspension Commonly known as: Carafate Take 10 mLs (1 g total) by mouth 4 (four) times daily -  with meals and at bedtime.        Follow-up Information     Grayce Sessions, NP Follow up in 1 week(s).   Specialty: Internal Medicine Contact information: [address] [place] [address] [phone]                  Time coordinating discharge: 39 minutes  Signed:  Shakir Petrosino  [facility] 06/20/2023, 9:36 AM

## 2023-06-20 NOTE — Progress Notes (Signed)
Patient with active discharge order and refusing to leave. IV removed, home medications returned and discharge paperwork placed in patient's belongings bag. Security called to assist with discharge and patient escorted off of unit.

## 2023-06-21 ENCOUNTER — Telehealth (INDEPENDENT_AMBULATORY_CARE_PROVIDER_SITE_OTHER): Payer: Self-pay

## 2023-06-21 NOTE — Transitions of Care (Post Inpatient/ED Visit) (Signed)
   06/21/2023  Name: [name] MRN: [id number] DOB: Aug 22, 1970  Today's TOC FU Call Status: Today's TOC FU Call Status:: Unsuccessful Call (1st Attempt) Unsuccessful Call (1st Attempt) Date: 06/21/23  Attempted to reach the patient regarding the most recent Inpatient/ED visit.  Follow Up Plan: Additional outreach attempts will be made to reach the patient to complete the Transitions of Care (Post Inpatient/ED visit) call.   Signature Karena Addison, LPN [facility] Nurse Health Advisor Direct Dial [phone]

## 2023-06-22 NOTE — Transitions of Care (Post Inpatient/ED Visit) (Signed)
   06/22/2023  Name: [name] MRN: [id number] DOB: [birth year]  Today's TOC FU Call Status: Today's TOC FU Call Status:: Unsuccessful Call (2nd Attempt) Unsuccessful Call (1st Attempt) Date: 06/21/23 Unsuccessful Call (2nd Attempt) Date: 06/22/23  Attempted to reach the patient regarding the most recent Inpatient/ED visit.  Follow Up Plan: Additional outreach attempts will be made to reach the patient to complete the Transitions of Care (Post Inpatient/ED visit) call.   Signature Karena Addison, LPN [facility] Nurse Health Advisor Direct Dial [phone]

## 2023-06-26 NOTE — Transitions of Care (Post Inpatient/ED Visit) (Signed)
   06/26/2023  Name: [name] MRN: [id number] DOB: [birth year]  Today's TOC FU Call Status: Today's TOC FU Call Status:: Unsuccessful Call (3rd Attempt) Unsuccessful Call (1st Attempt) Date: 06/21/23 Unsuccessful Call (2nd Attempt) Date: 06/22/23 Unsuccessful Call (3rd Attempt) Date: 06/26/23  Attempted to reach the patient regarding the most recent Inpatient/ED visit.  Follow Up Plan: No further outreach attempts will be made at this time. We have been unable to contact the patient.  Signature Karena Addison, LPN [facility] Nurse Health Advisor Direct Dial [phone]

## 2023-07-01 ENCOUNTER — Emergency Department (HOSPITAL_COMMUNITY)
Admission: EM | Admit: 2023-07-01 | Discharge: 2023-07-01 | Disposition: A | Discharge status: Home / Self Care | Payer: MEDICAID | Attending: Emergency Medicine | Admitting: Emergency Medicine

## 2023-07-01 ENCOUNTER — Encounter (HOSPITAL_COMMUNITY): Payer: Self-pay | Admitting: *Deleted

## 2023-07-01 ENCOUNTER — Emergency Department (HOSPITAL_COMMUNITY): Payer: MEDICAID

## 2023-07-01 ENCOUNTER — Other Ambulatory Visit: Payer: Self-pay

## 2023-07-01 DIAGNOSIS — Y92149 Unspecified place in prison as the place of occurrence of the external cause: Secondary | ICD-10-CM | POA: Diagnosis not present

## 2023-07-01 DIAGNOSIS — W01198A Fall on same level from slipping, tripping and stumbling with subsequent striking against other object, initial encounter: Secondary | ICD-10-CM | POA: Insufficient documentation

## 2023-07-01 DIAGNOSIS — Z91040 Latex allergy status: Secondary | ICD-10-CM | POA: Diagnosis not present

## 2023-07-01 DIAGNOSIS — R079 Chest pain, unspecified: Secondary | ICD-10-CM | POA: Diagnosis not present

## 2023-07-01 DIAGNOSIS — K860 Alcohol-induced chronic pancreatitis: Secondary | ICD-10-CM | POA: Insufficient documentation

## 2023-07-01 DIAGNOSIS — S0990XA Unspecified injury of head, initial encounter: Secondary | ICD-10-CM | POA: Diagnosis present

## 2023-07-01 DIAGNOSIS — S0093XA Contusion of unspecified part of head, initial encounter: Secondary | ICD-10-CM

## 2023-07-01 DIAGNOSIS — S0083XA Contusion of other part of head, initial encounter: Secondary | ICD-10-CM | POA: Diagnosis not present

## 2023-07-01 LAB — LIPASE, BLOOD: Lipase: 21 U/L (ref 11–51)

## 2023-07-01 LAB — URINALYSIS, ROUTINE W REFLEX MICROSCOPIC
Bilirubin Urine: NEGATIVE
Glucose, UA: NEGATIVE mg/dL
Hgb urine dipstick: NEGATIVE
Ketones, ur: NEGATIVE mg/dL
Leukocytes,Ua: NEGATIVE
Nitrite: NEGATIVE
Protein, ur: NEGATIVE mg/dL
Specific Gravity, Urine: 1.014 (ref 1.005–1.030)
pH: 5 (ref 5.0–8.0)

## 2023-07-01 LAB — CBC
HCT: 30.1 % — ABNORMAL LOW (ref 39.0–52.0)
Hemoglobin: 9.4 g/dL — ABNORMAL LOW (ref 13.0–17.0)
MCH: 28.5 pg (ref 26.0–34.0)
MCHC: 31.2 g/dL (ref 30.0–36.0)
MCV: 91.2 fL (ref 80.0–100.0)
Platelets: 168 10*3/uL (ref 150–400)
RBC: 3.3 MIL/uL — ABNORMAL LOW (ref 4.22–5.81)
RDW: 20.5 % — ABNORMAL HIGH (ref 11.5–15.5)
WBC: 7.4 10*3/uL (ref 4.0–10.5)
nRBC: 0 % (ref 0.0–0.2)

## 2023-07-01 LAB — COMPREHENSIVE METABOLIC PANEL
ALT: 30 U/L (ref 0–44)
AST: 50 U/L — ABNORMAL HIGH (ref 15–41)
Albumin: 3.4 g/dL — ABNORMAL LOW (ref 3.5–5.0)
Alkaline Phosphatase: 55 U/L (ref 38–126)
Anion gap: 13 (ref 5–15)
BUN: 7 mg/dL (ref 6–20)
CO2: 20 mmol/L — ABNORMAL LOW (ref 22–32)
Calcium: 7.2 mg/dL — ABNORMAL LOW (ref 8.9–10.3)
Chloride: 108 mmol/L (ref 98–111)
Creatinine, Ser: 0.72 mg/dL (ref 0.61–1.24)
GFR, Estimated: >60 mL/min (ref >60)
Glucose, Bld: 146 mg/dL — ABNORMAL HIGH (ref 70–99)
Potassium: 4.9 mmol/L (ref 3.5–5.1)
Sodium: 141 mmol/L (ref 135–145)
Total Bilirubin: 0.3 mg/dL (ref 0.0–1.2)
Total Protein: 6.7 g/dL (ref 6.5–8.1)

## 2023-07-01 LAB — TROPONIN I (HIGH SENSITIVITY)
Troponin I (High Sensitivity): 4 ng/L (ref <18)
Troponin I (High Sensitivity): 6 ng/L (ref <18)

## 2023-07-01 MED ORDER — LIDOCAINE 5 % EX PTCH: 1.0000 | MEDICATED_PATCH | CUTANEOUS | 0 refills | Status: DC

## 2023-07-01 MED ORDER — OXYCODONE-ACETAMINOPHEN 5-325 MG PO TABS
1.0000 | ORAL_TABLET | Freq: Once | ORAL | Status: AC
  Administered 2023-07-01: 1 via ORAL
  Filled 2023-07-01: qty 1

## 2023-07-01 MED ORDER — CYCLOBENZAPRINE HCL 10 MG PO TABS: 10.0000 mg | ORAL_TABLET | Freq: Two times a day (BID) | ORAL | 0 refills | Status: DC | PRN

## 2023-07-01 NOTE — ED Triage Notes (Signed)
 The pt is c/o abd pain for 4 days  he has been in prison for the past 4 days and he reports that he was not given any of his meds   he was just released 30 minutes ago     he reports that he fell in the door in the ed and struck his chest against  the door ?? Hx pancreatitis

## 2023-07-01 NOTE — ED Notes (Signed)
 [phone] Val Eagle, pt brother called asking if his mom was with the pt and if so to give him a call.

## 2023-07-01 NOTE — ED Notes (Signed)
 Patient transported to CT

## 2023-07-01 NOTE — ED Provider Notes (Signed)
 7:21 AM Care assumed from Dr. Oletta Cohn.  At time of transfer of care, patient is awaiting for results of head CT, chest x-ray.  If imaging reassuring, plan of care with discharge home given previously reassuring workup and patient's improved appearance by report.  9:08 AM Patient CT head reassuring without evidence of new fracture.  No evidence of rib fractures or significant chest injury.  Suspect chest wall discomfort from the fall as per previous team.  Will give prescription for muscle relaxant and Lidoderm patches and he will follow-up with his primary team for further pain management.  Patient discharged for outpatient follow-up.  Clinical Impression: 1. Alcohol-induced chronic pancreatitis (HCC)   2. Contusion of head, unspecified part of head, initial encounter     Disposition: Discharge  Condition: Good  I have discussed the results, Dx and Tx plan with the pt(& family if present). He/she/they expressed understanding and agree(s) with the plan. Discharge instructions discussed at great length. Strict return precautions discussed and pt &/or family have verbalized understanding of the instructions. No further questions at time of discharge.    New Prescriptions   CYCLOBENZAPRINE (FLEXERIL) 10 MG TABLET    Take 1 tablet (10 mg total) by mouth 2 (two) times daily as needed for muscle spasms.   LIDOCAINE (LIDODERM) 5 %    Place 1 patch onto the skin daily. Remove & Discard patch within 12 hours or as directed by MD    Follow Up: Grayce Sessions, NP [address] [place] [address] [phone]     [facility] Department at [facility] [address] [place] [address] [phone]       Jonay Hitchcock, Canary Brim, MD 07/01/23 [phone]

## 2023-07-01 NOTE — Discharge Instructions (Addendum)
 Your history, exam, workup today did not show evidence of new traumatic injury to the head and did not show any evidence of rib fractures or significant chest wall injury.  Your labs are similar to prior and your heart enzymes were reassuring both times they were checked.  Please rest and stay hydrated and use the numbing patch and muscle medicine to help with the chest wall discomfort and spasm.  Please follow-up with your pain team and primary teams.  If any symptoms change or worsen acutely, return to the nearest emergency department.

## 2023-07-01 NOTE — ED Provider Notes (Signed)
 [name] Provider Note   CSN: [id number] Arrival date & time: 07/01/23  0007     History  Chief Complaint  Patient presents with   Abdominal Pain   Chest Pain    [name] is a 53 y.o. male.  Presents to the emergency department for evaluation of multiple problems.  Patient complaining of chest pain and abdominal pain.  He reports that his abdominal pain is probably from his pancreatitis.  Patient reports that he was in jail for 4 days.  He got sick while he was in jail and his family came and found him out tonight.  Patient apparently fell after he got out of jail and hit the left side of his head.  He reports that he was out of it for about 20 seconds.  Family is concerned that he "had a seizure".       Home Medications Prior to Admission medications   Medication Sig Start Date End Date Taking? Authorizing Provider  bismuth subsalicylate (KAOPECTATE) 262 MG/15ML suspension Take 15 mLs by mouth every 6 (six) hours as needed for indigestion or diarrhea or loose stools.    [provider]  dicyclomine (BENTYL) 10 MG capsule Take 1 capsule (10 mg total) by mouth 4 (four) times daily -  before meals and at bedtime for 5 days. 06/20/23 06/25/23  Pokhrel, Rebekah Chesterfield, MD  fluticasone (FLONASE) 50 MCG/ACT nasal spray Place 2 sprays into both nostrils daily as needed for allergies.    [provider]  folic acid (FOLVITE) 1 MG tablet Take 1 tablet (1 mg total) by mouth daily. 09/21/22   Pokhrel, Rebekah Chesterfield, MD  lidocaine (XYLOCAINE) 2 % solution Use as directed 15 mLs in the mouth or throat every 4 (four) hours as needed for mouth pain. 06/20/23   Pokhrel, Rebekah Chesterfield, MD  lipase/protease/amylase (CREON) 36000 UNITS CPEP capsule Take 2 capsules (72,000 Units total) by mouth 3 (three) times daily with meals. May also take 1 capsule (36,000 Units total) as needed (with snacks). Patient taking differently: Take 2 capsules (72,000  Units total) by mouth for breakfast and dinner , then take 1 capsule for lunch. May also take 1 capsule (36,000 Units total) as needed (with snacks). 01/26/23   Jerald Kief, MD  liver oil-zinc oxide (DESITIN) 40 % ointment Apply topically 2 (two) times daily. Apply to buttocks Patient taking differently: Apply 1 Application topically as needed for irritation. Apply to buttocks 03/07/23   Rodolph Bong, MD  loperamide (IMODIUM) 2 MG capsule Take 1 capsule (2 mg total) by mouth as needed for diarrhea or loose stools. Patient not taking: Reported on 05/26/2023 03/07/23   Rodolph Bong, MD  magic mouthwash SOLN Take 10 mLs by mouth 4 (four) times daily. 06/10/23   [provider]  magnesium oxide (MAG-OX) 400 (240 Mg) MG tablet Take 400 mg by mouth daily. 04/07/23   [provider]  metoprolol (TOPROL-XL) 200 MG 24 hr tablet Take 1 tablet (200 mg total) by mouth daily. 03/07/23   Rodolph Bong, MD  nicotine (NICODERM CQ - DOSED IN MG/24 HOURS) 21 mg/24hr patch Place 1 patch (21 mg total) onto the skin daily. 06/20/23   Pokhrel, Rebekah Chesterfield, MD  nitroGLYCERIN (NITROSTAT) 0.4 MG SL tablet Place 0.4 mg under the tongue every 5 (five) minutes as needed for chest pain.    [provider]  ondansetron (ZOFRAN) 4 MG tablet Take 1 tablet (4 mg total) by mouth every  8 (eight) hours as needed for nausea or vomiting. 05/26/23   Dolphus Jenny, PA-C  oxyCODONE-acetaminophen (PERCOCET) 5-325 MG tablet Take 1 tablet by mouth every 6 (six) hours as needed for moderate pain (pain score 4-6) or severe pain (pain score 7-10). 06/20/23   Pokhrel, Rebekah Chesterfield, MD  pantoprazole (PROTONIX) 40 MG tablet Take 1 tablet (40 mg total) by mouth 2 (two) times daily. 05/26/23   Dolphus Jenny, PA-C  pregabalin (LYRICA) 100 MG capsule Take 100 mg by mouth 2 (two) times daily. 02/26/23   [provider]  Simethicone (GAS-X PO) Take 1 capsule by mouth 2 (two) times daily as needed (gas).    [provider]  sucralfate (CARAFATE) 1 GM/10ML suspension Take 10 mLs (1 g total) by mouth 4 (four) times daily -  with meals and at bedtime. 05/26/23   Dolphus Jenny, PA-C  amitriptyline (ELAVIL) 25 MG tablet Take 1 tablet (25 mg total) by mouth at bedtime. Patient not taking: Reported on 08/08/2019 10/15/18 08/08/19  Rolly Salter, MD      Allergies    Robaxin [methocarbamol], Aspirin, Shellfish-derived products, Trazodone and nefazodone, Adhesive [tape], Fish-derived products, Latex, Toradol [ketorolac tromethamine], Contrast media [iodinated contrast media], Reglan [metoclopramide], and Salmon [fish oil]    Review of Systems   Review of Systems  Physical Exam Updated Vital Signs BP (!) 138/102   Pulse 74   Temp 97.8 F (36.6 C)   Resp 19   Ht 5\' 8"  (1.727 m)   Wt 59.5 kg   SpO2 100%   BMI 19.94 kg/m  Physical Exam Vitals and nursing note reviewed.  Constitutional:      General: He is not in acute distress.    Appearance: He is well-developed.  HENT:     Head: Normocephalic and atraumatic.     Mouth/Throat:     Mouth: Mucous membranes are moist.  Eyes:     General: Vision grossly intact. Gaze aligned appropriately.     Extraocular Movements: Extraocular movements intact.     Conjunctiva/sclera: Conjunctivae normal.  Cardiovascular:     Rate and Rhythm: Normal rate and regular rhythm.     Pulses: Normal pulses.     Heart sounds: Normal heart sounds, S1 normal and S2 normal. No murmur heard.    No friction rub. No gallop.  Pulmonary:     Effort: Pulmonary effort is normal. No respiratory distress.     Breath sounds: Normal breath sounds.  Chest:     Chest wall: Tenderness present. No deformity or crepitus.  Abdominal:     Palpations: Abdomen is soft.     Tenderness: There is abdominal tenderness in the right upper quadrant, epigastric area and left upper quadrant. There is no guarding or rebound.     Hernia: No hernia is present.  Musculoskeletal:        General: No  swelling.     Cervical back: Full passive range of motion without pain, normal range of motion and neck supple. No pain with movement, spinous process tenderness or muscular tenderness. Normal range of motion.     Right lower leg: No edema.     Left lower leg: No edema.  Skin:    General: Skin is warm and dry.     Capillary Refill: Capillary refill takes less than 2 seconds.     Findings: No ecchymosis, erythema, lesion or wound.  Neurological:     Mental Status: He is alert and oriented to person, place, and  time.     GCS: GCS eye subscore is 4. GCS verbal subscore is 5. GCS motor subscore is 6.     Cranial Nerves: Cranial nerves 2-12 are intact.     Sensory: Sensation is intact.     Motor: Motor function is intact. No weakness or abnormal muscle tone.     Coordination: Coordination is intact.  Psychiatric:        Mood and Affect: Mood normal.        Speech: Speech normal.        Behavior: Behavior normal.     ED Results / Procedures / Treatments   Labs (all labs ordered are listed, but only abnormal results are displayed) Labs Reviewed  COMPREHENSIVE METABOLIC PANEL - Abnormal; Notable for the following components:      Result Value   CO2 20 (*)    Glucose, Bld 146 (*)    Calcium 7.2 (*)    Albumin 3.4 (*)    AST 50 (*)    All other components within normal limits  CBC - Abnormal; Notable for the following components:   RBC 3.30 (*)    Hemoglobin 9.4 (*)    HCT 30.1 (*)    RDW 20.5 (*)    All other components within normal limits  LIPASE, BLOOD  URINALYSIS, ROUTINE W REFLEX MICROSCOPIC  TROPONIN I (HIGH SENSITIVITY)  TROPONIN I (HIGH SENSITIVITY)    EKG EKG Interpretation Date/Time:  Saturday July 01 2023 00:18:01 EST Ventricular Rate:  74 PR Interval:  104 QRS Duration:  80 QT Interval:  362 QTC Calculation: 401 R Axis:   46  Text Interpretation: Sinus rhythm with short PR Right atrial enlargement Nonspecific ST and T wave abnormality Abnormal ECG When  compared with ECG of 18-Jun-2023 09:36, No acute changes Confirmed by Gilda Crease [phone]) on 07/01/2023 5:07:31 AM  Radiology No results found.  Procedures Procedures    Medications Ordered in ED Medications - No data to display  ED Course/ Medical Decision Making/ A&P                                 Medical Decision Making Amount and/or Complexity of Data Reviewed Labs: ordered. Radiology: ordered.   Complains of upper abdominal pain and chest pain.  These are common complaints for this patient who frequents the ED.  Patient does have a history of chronic pancreatitis.  Patient has had multiple workups in the last month including CT and MR abdomen that did not show any acute problems.  He was recently hospitalized for these complaints as well.  Lab work is stable.  Patient's vital signs are unremarkable.  Doubt acute process.  This is consistent with his frequent chronic pain complaints.  Patient reports a fall.  CT head will be performed.  Chest x-ray without volume overload.  No displaced rib fractures or signs of lung injury, does report that he fell onto his chest.  This is likely the cause of his chest discomfort.  Area is tender to the touch.  Will sign out to oncoming ER physician, if CT head is normal, discharge patient.        Final Clinical Impression(s) / ED Diagnoses Final diagnoses:  Alcohol-induced chronic pancreatitis (HCC)  Contusion of head, unspecified part of head, initial encounter    Rx / DC Orders ED Discharge Orders     None         Jovee Dettinger,  Canary Brim, MD 07/01/23 [phone]

## 2023-07-06 ENCOUNTER — Telehealth (INDEPENDENT_AMBULATORY_CARE_PROVIDER_SITE_OTHER): Payer: Self-pay | Admitting: Primary Care

## 2023-07-06 NOTE — Telephone Encounter (Signed)
 Called pt to confirm atp. [name] phone was unavailable.

## 2023-07-12 ENCOUNTER — Telehealth (INDEPENDENT_AMBULATORY_CARE_PROVIDER_SITE_OTHER): Payer: Self-pay | Admitting: Primary Care

## 2023-07-13 ENCOUNTER — Ambulatory Visit: Payer: MEDICAID | Admitting: Gastroenterology

## 2023-07-13 ENCOUNTER — Ambulatory Visit (INDEPENDENT_AMBULATORY_CARE_PROVIDER_SITE_OTHER): Payer: MEDICAID | Admitting: Primary Care

## 2023-07-13 ENCOUNTER — Telehealth (INDEPENDENT_AMBULATORY_CARE_PROVIDER_SITE_OTHER): Payer: Self-pay | Admitting: Primary Care

## 2023-07-13 NOTE — Telephone Encounter (Signed)
 Called pt to see if interested in a virtual appt. Pt did not answer and VM was left.

## 2023-07-13 NOTE — Progress Notes (Deleted)
 HPI :  53 year old male here for a follow-up visit for chronic pancreatitis, chronic abdominal pain.  History of peptic ulcer disease, history of alcohol and tobacco use.   See prior clinic notes for full details of his case.  He has had a history of pancreatitis in the past due to alcohol.  He has had a pretty extensive evaluation over the years as outlined below.    He has chronic abdominal pain that bothers him, mostly in his epigastric area to left mid abdomen.  Present typically all day, all the time, with fluctuations in the severity.  He feels nauseated frequently but typically does not vomit.  He is lost a few pounds over the past year.  He has chronic loose stools at baseline, has been on Creon which he states he is compliant with.  Also using Imodium which does not really help him as much.  He is on a regimen as outlined below, using Zofran for nausea which can help but he is not take it routinely.  He states he is on Protonix 40 mg once daily but still having some reflux and burning in the throat that bothering him, asking for liquid Carafate which has helped him in the past.   Recall he smokes tobacco routinely, he is down to about 1/2 pack a day from a full pack per day, he continues to struggle with this and asked for assistance.  He has not been able to stop drinking alcohol, he usually having about 3 alcoholic beverages per week although for the past week he has not used much.  He has been seen by pain management in the past, its been difficult to get him into a clinic for this.  He has used narcotics periodically in the past for it.   He was admitted in August for worsening symptoms, was in the hospital few days.  They put him on Cymbalta to help with chronic pain however it does not appear he continue that at time of discharge or at least it was stopped at some point along the line.  He is certainly willing and interested to take that again if it helps him.  We discussed his regimen at  length and long-term course.   On review of his chart he has had 8 imaging studies of his abdomen over the past year, mostly all of them show chronic pancreatitis without any other concerning or high risk lesions in his pancreas or elsewhere.    EGD 11/13/19 -  - Multiple small white plaques were found in the upper third of the esophagus, grossly consistent with esophageal candidiasis. - The exam of the esophagus was otherwise normal. - Patchy mild inflammation characterized by erythema was found in the gastric body and in the gastric antrum. - The exam of the stomach was otherwise normal. - Biopsies were taken with a cold forceps in the gastric body, at the incisura and in the gastric antrum for Helicobacter pylori testing. - One non-bleeding cratered duodenal ulcer with no stigmata of bleeding was found in the distal duodenal bulb (entering first portion duodenal sweep), surrounded by some nodular inflammatory appearing mucosa. The lesion was 4 mm in largest dimension. Biopsies were taken with a cold forceps for histology from the periphery of it / nodular mucosa. - Patchy inflammation characterized by erosions was found in the duodenal bulb. - The exam of the duodenum was otherwise normal.   Colonoscopy 11/13/19 - The perianal and digital rectal examinations were normal. - The  terminal ileum appeared normal. - Two sessile polyps were found in the transverse colon. The polyps were 3 to 7 mm in size. These polyps were removed with a cold snare. Resection and retrieval were complete. - A 3 to 4 mm polyp was found in the sigmoid colon. The polyp was sessile. The polyp was removed with a cold snare. Resection and retrieval were complete. - Internal hemorrhoids were found during retroflexion. The hemorrhoids were small. - The exam was otherwise without abnormality. There was some residual gum / seeds in the cecum which were lavaged / washed and no polyps / lesions noted in the cecum. - Biopsies  for histology were taken with a cold forceps from the right colon, left colon and transverse colon for evaluation of microscopic colitis.   1. Surgical [P], duodenal ulcer - PEPTIC DUODENITIS. EVIDENCE OF HEALING EROSION/ULCER. - WARTHIN-STARRY STAIN IS NEGATIVE FOR HELICOBACTER PYLORI. 2. Surgical [P], gastric antrum and gastric body - MILD CHRONIC GASTRITIS. MILD REACTION GASTROPATHY. - WARTHIN-STARRY STAIN IS NEGATIVE FOR HELICOBACTER PYLORI. 3. Surgical [P], colon, transverse, sigmoid, polyp (3) - TUBULAR ADENOMA, NEGATIVE FOR HIGH GRADE DYSPLASIA (X1). -HYPERPLASTIC POLYP (X2). 4. Surgical [P], colon, random sites - NO SIGNIFICANT PATHOLOGIC FINDINGS. - NEGATIVE FOR ACTIVE INFLAMMATION AND OTHER ABNORMALITIES.     MRCP 11/22/19 - IMPRESSION: 1. Findings of chronic pancreatitis with decreased and new/increased cystic lesions throughout compared to 08/08/2019 CT (given difficulty in cross modality comparison). These are likely a combination of foci of duct dilatation and intraparenchymal pseudocysts. No evidence of acute pancreatitis. 2. No biliary duct dilatation. 3.  Possible constipation. 4. Chronic gastric wall thickening, again suspicious for gastritis. 5.  Aortic Atherosclerosis (ICD10-I70.0).   Gastrin level 11/26/19 - 1635   Chromograinin A level 12/18/19 - 1296   EUS 01/02/20 - Medium sized (4.8cm) thick walled cystic lesion in the body of the pancreas. This was nearly completley aspirated, yielding 120cc of brownish, old blood tinged fluid with solid flecks of tissue which was sent for cytology, CEA and amylase. - Following cyst aspiration the pancreatic parenchyma showed pretty clear signs of chronic pancreatitis but no obvious mass lesions (except to the residual 'rind' from the cyst aspiration above). The changes of chronic pancreatitis signficantly limit the sensitivity of EUS (and other imaging modalities) to detect neolasm. - The previously noted duodenal bulb  ulcer is much improved in the interim.   Cytology  Negative Lipase / amylase level not returned      EGD 10/25/20: The Z-line was regular and was found 43 cm from the incisors. A 2 cm hiatal hernia was present. Patchy mildly erythematous mucosa without bleeding was found in the gastric body and in the gastric antrum - previously biopsied within the last 10 months so not rebiopsied. No other gross lesions were noted in the entire examined stomach. No gross lesions were noted in the duodenal bulb, in the first portion of the duodenum and in the second portion of the duodenum.   Flexible sig 10/25/20: A large amount of semi-liquid semi-solid stool was found in the entire colon, interfering with visualization. Lavage of the area was performed using copious amounts, resulting in clearance with fair visualization. A single (solitary) ten mm ulcer was found in the distal rectum. Oozing was present. It is not clear to me that this is truly a stercoral ulcer vs potential ulcer from recent enemas for today's procedure. I favor this to be from the enemas, but it needed to be treated. For hemostasis, three hemostatic clips  were successfully placed (MR conditional). There was no bleeding at the end of the procedure. Non-bleeding non-thrombosed internal hemorrhoids were found during retroflexion, during perianal exam and during digital exam. The hemorrhoids were Grade II (internal hemorrhoids that prolapse but reduce spontaneously).       Echo 02/16/22: EF 65-70%     RUQ [place] 02/17/22: IMPRESSION: Negative right upper quadrant ultrasound   CT abdomen / pelvis without contrast 01/20/22: IMPRESSION: 1. No acute intra-abdominal or pelvic pathology. No bowel obstruction. Normal appendix. 2. Sequela of chronic pancreatitis. 3. Aortic Atherosclerosis (ICD10-I70.0).   MRCP 12/22/21: IMPRESSION: 1. No evidence acute pancreatitis. Chronic cysts within the pancreatic body are improved from MRI 2021. No  new fluid collections. 2. Normal biliary tree.  No cholelithiasis. 3. No enhancing hepatic lesion. 4. Some limitation of exam due to patient being uncooperative.     CT scan abdomen / pelvis 12/11/21: IMPRESSION: 1. Slight indistinctness of peripancreatic fat planes may represent acute pancreatitis. 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 No evidence of new pancreatic collection. 2. Sequela of chronic pancreatitis with pancreatic calcifications. 3. Chronic gastric thickening, unchanged from prior exam. 4. Minimal left colonic diverticulosis without diverticulitis.   CT abdomen / pelvis 11/02/21: IMPRESSION: 1. Coarse calcifications consistent with chronic calcific pancreatitis. Questionable acute on chronic pancreatitis with trace stranding at the anterior head of the pancreas versus fat scarring from old pancreatitis. 2. 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 3. Gastroenteritis, constipation, diverticulosis and likely undigested medication in the small and large bowel. 4. Aortic atherosclerosis.     CT abdomen / pelvis 08/14/21 IMPRESSION: 1. Interval development of moderate right and small left pleural effusions with bibasilar atelectasis. 2. Sequelae of chronic pancreatitis. 3. Small lesions in the pancreas mm hypoattenuating are unchanged and may reflect chronic pseudocysts. 4. Aortic Atherosclerosis (ICD10-I70.0).     CT scan abdomen  / pelvis 06/06/21: IMPRESSION: 1. Mild acute pancreatitis. 2. 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 3. Air-fluid levels throughout small bowel loops. Findings are nonspecific, but can be seen with enteritis.   11/28/2022 CT abdomen pelvis without contrast for acute pancreatitis/pain showed stable calcifications within the  pancreatic head and body consistent with chronic pancreatitis, grossly stable 3.3 cm fluid collection in pancreatic body consistent with pseudocyst liver normal, gallbladder unremarkable   Patient has had CT abdomen pelvis without contrast every month of 2024 so far other than February.  For abdominal pain, January 2 cm cystic mass distal pancreatic body, most recently 11/2020 3.3 cm. Last MRCP and EUS/2021 status post cyst aspiration which was unremarkable. 09/18/2022 CT AB for ab pain showed Changes of chronic pancreatitis with calcification. Midbody cystic lesion is noted in the pancreas stable in appearance from the priorn exam. Nonemergent MRI may be helpful for further evaluation.Gastric wall thickening with findings suspicious for ulceration. No perforation is noted.         53 y.o. male here for assessment of the following   1. Alcohol-induced chronic pancreatitis (HCC)   2. Chronic pain syndrome   3. Chronic diarrhea   4. Alcohol use disorder, severe, dependence (HCC)   5. Tobacco use disorder   6. Gastroesophageal reflux disease, unspecified whether esophagitis present     Chronic pancreatitis causing chronic pain syndrome which has been difficult to manage for him over time.  He has had numerous ED  visits this year with 8 imaging studies, none of which have shown any other clear cause.  We discussed his chronic pancreatitis and course at length.  Big risk factors are continued alcohol and tobacco use, I counseled him I do not think he is going to get better unless he is able to stop these.  He needs assistance to stop them.  I will refer him to behavioral health for alcohol and tobacco use.  He is agreeable to this.  I will also prescribe him nicotine gum as he states he has a hard time affording this over-the-counter.   As far as his medical regimen for chronic pain.  He was previously given Cymbalta back in the hospital and I think this would be a very good choice for him  long-term to help with his chronic pain.  We will add back Cymbalta 30 mg daily for a few weeks and then he can increase back to 60 mg daily.  I will add Bentyl 10 mg twice daily to see if this will help his pain at all and to decrease his stools.  His Creon is based on his weight, he can use to caps with each meals, increase to 3 tabs per meal at max based on his weight.  He is having worsening reflux recently, will increase his Protonix to 40 mg twice daily and per his request we will give him some liquid Carafate to use as needed for breakthrough.  He should continue Zofran as needed.  He needs to follow-up with pain management for chronic pain if he wants to have narcotics for this long-term, I do not prescribe long-term narcotics in my clinic for this issue.  He inquires about other pain management offices, he can let me know where he wants to be seen him happy to refer him.  He agrees.   Of note, he has no primary care physician, referred to [facility].   PLAN: - start back cymbalta 30mg  / day for a few weeks and then increase to 60mg  / day - bentyl 10mg  BID added - continue Creon - he is currently weight based, max dose is 3 tabs at a time - stop alcohol and tobacco - counseled extensively on this - refer to behavioral health for alcohol dependence - prescribe Nicotine gum - refer to [facility] - increase protonix to BID dosing - liquid carafate 10cc po q 8 hours PRN #1 bottle RF1 - continue Zofran PRN - f/u pain management    I asked Dr. Meridee Score to review his case to see if he was a candidate for celiac block.  If the patient wants to entertain that he will offer him the exam.  This would also get a better look at the cyst although this appears to be likely pseudocyst.    Marchelle Folks can you let him know and see if he would want to pursue an EUS with possible celiac axis block?  Thanks      He got admitted October 2024 - tobacco, etoh, cocaine, THC  MRCP  02/08/23: IMPRESSION: 1. 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 No evidence acute pancreatitis. 2. Normal biliary tree. 3. No enhancing lesion within liver.    CT abdomen pelvis 02/27/23: IMPRESSION: Changes of chronic pancreatitis with pseudocyst formation. Largest 3.9 cm   No acute abnormality is identified to correspond with the given clinical history.   Pancreatic cyst  on CT scan, Elevated CA 19-9, Generalized abdominal pain, Celiac plexus block for pain secondary to chronic pancreatitis EUS 03/06/23: EGD Impression: - Tortuous esophagus. Z-line irregular, 45 cm from the incisors. - 1 cm hiatal hernia. - Portal hypertensive gastropathy proximally. - Three non-bleeding angioectasias in the stomach. Treated with argon plasma coagulation (APC). - No gross lesions in the entire stomach. - Single duodenal angioectasia noted in D2. Treated with APC. - Congested duodenal mucosa. Biopsied. - Mucosal changes in the duodenum.   EUS Impression: - A cystic lesion was seen in the pancreatic tail. Cytology results are pending. However, the endosonographic appearance is suspicious for a pancreatic pseudocyst. Fine needle aspiration for fluid performed with elevated CA19-9 level noted in bloodwork (has been elevated for years). - Pancreatic parenchymal abnormalities consisting of hyperechoic foci and lobularity with honeycombing were noted in the entire pancreas. - The pancreatic duct had a tortuous/ectatic appearance in the pancreatic head, genu of the pancreas, body of the pancreas and tail of the pancreas. - Patient has chronic pancreatitis. - Hyperechoic material consistent with sludge was visualized endosonographically in the gallbladder. - There was no sign of significant pathology in the common bile duct and in the common hepatic duct (other than prominence proximally). - No  malignant-appearing lymph nodes were visualized in the celiac region (level 20), peripancreatic region and porta hepatis region. - Due to variance in anatomy from the Celiac trunk, safe endoscopic Celiac block cannot be pursued. - Caveat is as previously noted with the extensiveness of his chronic pancreatitis, subtle lesions can be missed in the endoscopic evaluation so sensitivity of today's procedure is decreased; but other changes such as ductal dilation or overt mass-lesion development is not felt to be noted at this time.  - Ciprofloxacin 500 mg BID x 3-days to decrease risk of post-interventional infectious risk. - Complete alcohol cessation recommended. - Observe patient's clinical course. - Await cytology results. - Repeat imaging reasonable within 41-months. - Caveat is as previously noted with the extensiveness of his chronic pancreatitis, subtle lesions can be missed in the endoscopic evaluation so sensitivity of today's procedure is decreased; but other changes such as ductal dilation or overt mass-lesion development is not felt to be noted at this time.  A. DUODENUM, BIOPSY:  Duodenal mucosa with intact villous architecture.  Negative for villous atrophy or increased intraepithelial lymphocytes.  Negative for dysplasia or malignancy.   FINAL MICROSCOPIC DIAGNOSIS:  - No malignant cells identified  - Findings consistent: With contents of a benign cyst   CEA 235 Amylase > 10000    5ED visits Jan / Feb   CT abdomen / pelvis 05/26/23: IMPRESSION: 1. No acute abnormality is seen within the abdomen or pelvis. 2. Redemonstration of sequela of chronic pancreatitis. No CT evidence of acute pancreatitis. 3. 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 4. Moderate to high-grade atherosclerotic calcifications within the distal abdominal aorta and bilateral iliac arteries.  CT abdomen   pelvis 06/08/23: IMPRESSION: 1. Findings suspicious for diffuse gastric wall thickening, particularly along the greater curvature. Findings may represent gastritis. 2. Scattered air-fluid levels throughout small bowel loops, nonspecific but can be seen in the setting of enteritis. 3. Stable cystic areas in the pancreas. Findings may represent pseudocysts or IPMNs. 4. Mild diffuse bladder wall thickening. Correlate clinically for cystitis. 5. Aortic atherosclerosis.    MRCP 06/08/23: IMPRESSION: 1. Diffusely atrophic pancreas with areas of susceptibility  artifact in keeping with calcification identified by prior CT consistent with stigmata of chronic pancreatitis. 2. 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 Mild, unchanged prominence of the pancreatic duct, measuring up to 0.4 cm centrally. 3. No acute inflammatory findings in the abdomen.   CT abdomen / pelvis 06/15/23: IMPRESSION: 1. Pancreatic calcification consistent with chronic pancreatitis. 2. Stable appearance of pancreatic cystic lesions measuring up to 4.2 cm diameter, possibly pseudocyst or IPMN. 3. Diffuse gastric wall thickening similar to prior studies. Likely gastritis but other etiologies are not excluded. 4. Aortic atherosclerosis. 5. Mildly thickened bladder wall possibly cystitis. Correlate with urinalysis.        Past Medical History:  Diagnosis Date   Alcoholism /alcohol abuse    Anemia    Anxiety    Arthritis    knees; arms; elbows (03/26/2015)   Asthma    Bipolar disorder (HCC)    Chronic bronchitis (HCC)    Chronic lower back pain    Chronic pancreatitis (HCC)    Cocaine abuse (HCC)    Depression    Family history of adverse reaction to anesthesia    Femoral condyle fracture (HCC) 03/08/2014   left medial/notes 03/09/2014   GERD (gastroesophageal reflux disease)    H/O hiatal hernia    H/O suicide  attempt 10/2012   High cholesterol    History of blood transfusion 10/2012   when I tried to commit suicide   History of stomach ulcers    Hypertension    Marijuana abuse, continuous    Migraine    a few times/year (03/26/2015)   Pancreatitis    Pneumonia 1990's X 3   PTSD (post-traumatic stress disorder)    Seizures (HCC)    Sickle cell trait (HCC)    WPW (Wolff-Parkinson-White syndrome)    [name] 03/06/2013     Past Surgical History:  Procedure Laterality Date   BIOPSY  11/25/2017   Procedure: BIOPSY;  Surgeon: [name] Modena, MD;  Location: [facility] ENDOSCOPY;  Service: Endoscopy;;   BIOPSY  10/14/2018   Procedure: BIOPSY;  Surgeon: [name] Modena, MD;  Location: [facility] ENDOSCOPY;  Service: Endoscopy;;   BIOPSY  03/06/2023   Procedure: BIOPSY;  Surgeon: Lemar Lofty., MD;  Location: [facility] ENDOSCOPY;  Service: Gastroenterology;;   CARDIAC CATHETERIZATION     CYST ENTEROSTOMY  01/02/2020   Procedure: CYST ASPIRATION;  Surgeon: Rachael Fee, MD;  Location: [facility] ENDOSCOPY;  Service: Endoscopy;;   ESOPHAGOGASTRODUODENOSCOPY N/A 03/06/2023   Procedure: ESOPHAGOGASTRODUODENOSCOPY (EGD);  Surgeon: Lemar Lofty., MD;  Location: [facility] ENDOSCOPY;  Service: Gastroenterology;  Laterality: N/A;   ESOPHAGOGASTRODUODENOSCOPY (EGD) WITH PROPOFOL N/A 11/25/2017   Procedure: ESOPHAGOGASTRODUODENOSCOPY (EGD) WITH PROPOFOL;  Surgeon: [name] Modena, MD;  Location: [facility] ENDOSCOPY;  Service: Endoscopy;  Laterality: N/A;   ESOPHAGOGASTRODUODENOSCOPY (EGD) WITH PROPOFOL Left 10/14/2018   Procedure: ESOPHAGOGASTRODUODENOSCOPY (EGD) WITH PROPOFOL;  Surgeon: [name] Modena, MD;  Location: [facility] ENDOSCOPY;  Service: Endoscopy;  Laterality: Left;   ESOPHAGOGASTRODUODENOSCOPY (EGD) WITH PROPOFOL N/A 11/14/2018   Procedure: ESOPHAGOGASTRODUODENOSCOPY (EGD) WITH PROPOFOL;  Surgeon: Carman Ching, MD;  Location: [facility] ENDOSCOPY;  Service: Gastroenterology;  Laterality: N/A;   ESOPHAGOGASTRODUODENOSCOPY (EGD) WITH  PROPOFOL N/A 01/02/2020   Procedure: ESOPHAGOGASTRODUODENOSCOPY (EGD) WITH PROPOFOL;  Surgeon: Rachael Fee, MD;  Location: [facility] ENDOSCOPY;  Service: Endoscopy;  Laterality: N/A;   ESOPHAGOGASTRODUODENOSCOPY (EGD) WITH PROPOFOL N/A 10/25/2020   Procedure: ESOPHAGOGASTRODUODENOSCOPY (EGD) WITH PROPOFOL;  Surgeon: Meridee Score Netty Starring., MD;  Location: [facility] ENDOSCOPY;  Service: Gastroenterology;  Laterality: N/A;   EUS N/A 01/02/2020   Procedure: UPPER ENDOSCOPIC ULTRASOUND (EUS) RADIAL;  Surgeon: Rachael Fee, MD;  Location: [facility] ENDOSCOPY;  Service: Endoscopy;  Laterality: N/A;   EUS N/A 03/06/2023   Procedure: UPPER ENDOSCOPIC ULTRASOUND (EUS) RADIAL;  Surgeon: Lemar Lofty., MD;  Location: [facility] ENDOSCOPY;  Service: Gastroenterology;  Laterality: N/A;   EYE SURGERY Left 1990's   "result of trauma"    FACIAL FRACTURE SURGERY Left 1990's   "result of trauma"    FINE NEEDLE ASPIRATION N/A 03/06/2023   Procedure: FINE NEEDLE ASPIRATION (FNA) LINEAR;  Surgeon: Lemar Lofty., MD;  Location: [facility] ENDOSCOPY;  Service: Gastroenterology;  Laterality: N/A;   FLEXIBLE SIGMOIDOSCOPY N/A 10/25/2020   Procedure: FLEXIBLE SIGMOIDOSCOPY;  Surgeon: Meridee Score Netty Starring., MD;  Location: [facility] ENDOSCOPY;  Service: Gastroenterology;  Laterality: N/A;   FRACTURE SURGERY     HEMOSTASIS CLIP PLACEMENT  10/25/2020   Procedure: HEMOSTASIS CLIP PLACEMENT;  Surgeon: Lemar Lofty., MD;  Location: [facility] ENDOSCOPY;  Service: Gastroenterology;;   HERNIA REPAIR     HOT HEMOSTASIS N/A 03/06/2023   Procedure: HOT HEMOSTASIS (ARGON PLASMA COAGULATION/BICAP);  Surgeon: Lemar Lofty., MD;  Location: [facility] ENDOSCOPY;  Service: Gastroenterology;  Laterality: N/A;   LEFT HEART CATHETERIZATION WITH CORONARY ANGIOGRAM Right 03/07/2013   Procedure: LEFT HEART CATHETERIZATION WITH CORONARY ANGIOGRAM;  Surgeon: Ricki Rodriguez, MD;  Location: [facility] CATH LAB;  Service: Cardiovascular;  Laterality: Right;    UMBILICAL HERNIA REPAIR     UPPER GASTROINTESTINAL ENDOSCOPY     Family History  Problem Relation Age of Onset   Hypertension Mother    Cirrhosis Father    Alcoholism Father    Hypertension Father    Melanoma Father    Hypertension Other    Coronary artery disease Other    Social History   Tobacco Use   Smoking status: Every Day    Current packs/day: 1.00    Average packs/day: 1 pack/day for 36.0 years (36.0 ttl pk-yrs)    Types: Cigarettes, E-cigarettes   Smokeless tobacco: Never  Vaping Use   Vaping status: Former  Substance Use Topics   Alcohol use: Not Currently    Alcohol/week: 4.0 standard drinks of alcohol    Types: 4 Cans of beer per week   Drug use: Not Currently    Types: Marijuana, Cocaine   Current Outpatient Medications  Medication Sig Dispense Refill   bismuth subsalicylate (KAOPECTATE) 262 MG/15ML suspension Take 15 mLs by mouth every 6 (six) hours as needed for indigestion or diarrhea or loose stools.     cyclobenzaprine (FLEXERIL) 10 MG tablet Take 1 tablet (10 mg total) by mouth 2 (two) times daily as needed for muscle spasms. 20 tablet 0   dicyclomine (BENTYL) 10 MG capsule Take 1 capsule (10 mg total) by mouth 4 (four) times daily -  before meals and at bedtime for 5 days. 20 capsule 0   fluticasone (FLONASE) 50 MCG/ACT nasal spray Place 2 sprays into both nostrils daily as needed for allergies.     folic acid (FOLVITE) 1 MG tablet Take 1 tablet (1 mg total) by mouth daily. 30 tablet 0   lidocaine (LIDODERM) 5 % Place 1 patch onto the skin daily. Remove & Discard patch within 12 hours or as directed by MD 15 patch 0   lidocaine (XYLOCAINE) 2 % solution Use as directed 15 mLs in the mouth or throat every 4 (four) hours as needed for mouth pain. 100 mL 0  lipase/protease/amylase (CREON) 36000 UNITS CPEP capsule Take 2 capsules (72,000 Units total) by mouth 3 (three) times daily with meals. May also take 1 capsule (36,000 Units total) as needed (with snacks).  (Patient taking differently: Take 2 capsules (72,000 Units total) by mouth for breakfast and dinner , then take 1 capsule for lunch. May also take 1 capsule (36,000 Units total) as needed (with snacks).) 240 capsule 0   liver oil-zinc oxide (DESITIN) 40 % ointment Apply topically 2 (two) times daily. Apply to buttocks (Patient taking differently: Apply 1 Application topically as needed for irritation. Apply to buttocks) 56 g 0   loperamide (IMODIUM) 2 MG capsule Take 1 capsule (2 mg total) by mouth as needed for diarrhea or loose stools. (Patient not taking: Reported on 05/26/2023) 20 capsule 0   magic mouthwash SOLN Take 10 mLs by mouth 4 (four) times daily.     magnesium oxide (MAG-OX) 400 (240 Mg) MG tablet Take 400 mg by mouth daily.     metoprolol (TOPROL-XL) 200 MG 24 hr tablet Take 1 tablet (200 mg total) by mouth daily. 90 tablet 0   nicotine (NICODERM CQ - DOSED IN MG/24 HOURS) 21 mg/24hr patch Place 1 patch (21 mg total) onto the skin daily. 28 patch 0   nitroGLYCERIN (NITROSTAT) 0.4 MG SL tablet Place 0.4 mg under the tongue every 5 (five) minutes as needed for chest pain.     ondansetron (ZOFRAN) 4 MG tablet Take 1 tablet (4 mg total) by mouth every 8 (eight) hours as needed for nausea or vomiting. 12 tablet 0   oxyCODONE-acetaminophen (PERCOCET) 5-325 MG tablet Take 1 tablet by mouth every 6 (six) hours as needed for moderate pain (pain score 4-6) or severe pain (pain score 7-10). 20 tablet 0   pantoprazole (PROTONIX) 40 MG tablet Take 1 tablet (40 mg total) by mouth 2 (two) times daily. 60 tablet 2   pregabalin (LYRICA) 100 MG capsule Take 100 mg by mouth 2 (two) times daily.     Simethicone (GAS-X PO) Take 1 capsule by mouth 2 (two) times daily as needed (gas).     sucralfate (CARAFATE) 1 GM/10ML suspension Take 10 mLs (1 g total) by mouth 4 (four) times daily -  with meals and at bedtime. 420 mL 0   No current facility-administered medications for this visit.   Allergies  Allergen  Reactions   Robaxin [Methocarbamol] Other (See Comments)    jumpy limbs   Aspirin Other (See Comments)    Unknown reaction   Shellfish-Derived Products Nausea And Vomiting and Rash   Trazodone And Nefazodone Other (See Comments)    Muscle spasms   Adhesive [Tape] Itching   Fish-Derived Products    Latex Itching   Toradol [Ketorolac Tromethamine] Other (See Comments)    Has ulcers; cannot have this   Contrast Media [Iodinated Contrast Media] Hives   Reglan [Metoclopramide] Other (See Comments)    Muscle spasms   Salmon [Fish Oil] Nausea And Vomiting and Rash     Review of Systems: All systems reviewed and negative except where noted in HPI.    CT HEAD WO CONTRAST ( ) Result Date: 07/01/2023 CLINICAL DATA:  53 year old male with abdominal pain.  Fall. EXAM: CT HEAD WITHOUT CONTRAST TECHNIQUE: Contiguous axial images were obtained from the base of the skull through the vertex without intravenous contrast. RADIATION DOSE REDUCTION: This exam was performed according to the departmental dose-optimization program which includes automated exposure control, adjustment of the mA and/or kV according to patient  size and/or use of iterative reconstruction technique. COMPARISON:  Head CT 02/06/2023. FINDINGS: Brain: No midline shift, ventriculomegaly, mass effect, evidence of mass lesion, intracranial hemorrhage or evidence of cortically based acute infarction. Gray-white matter differentiation is within normal limits throughout the brain. No encephalomalacia identified. Vascular: No suspicious intracranial vascular hyperdensity. Skull: Chronic left orbital wall fractures. Mild chronic deformity superior right calvarium at the vertex. No acute osseous abnormality identified. Sinuses/Orbits: Visualized paranasal sinuses and mastoids are stable and well aerated. Other: No acute orbit or scalp soft tissue injury identified. IMPRESSION: 1. No acute traumatic injury identified. Stable and normal noncontrast  CT appearance of the brain. 2. Chronic right vertex and left orbital fractures. Electronically Signed   By: Odessa Fleming M.D.   On: 07/01/2023 08:35   DG Chest Port 1 View Result Date: 07/01/2023 CLINICAL DATA:  Missed dialysis session. EXAM: PORTABLE CHEST 1 VIEW COMPARISON:  June 14, 2023. FINDINGS: The heart size and mediastinal contours are within normal limits. Both lungs are clear. The visualized skeletal structures are unremarkable. IMPRESSION: No active disease. Electronically Signed   By: Lupita Raider M.D.   On: 07/01/2023 07:56   CT ABDOMEN PELVIS WO CONTRAST Result Date: 06/15/2023 CLINICAL DATA:  Acute severe pancreatitis. EXAM: CT ABDOMEN AND PELVIS WITHOUT CONTRAST TECHNIQUE: Multidetector CT imaging of the abdomen and pelvis was performed following the standard protocol without IV contrast. RADIATION DOSE REDUCTION: This exam was performed according to the departmental dose-optimization program which includes automated exposure control, adjustment of the mA and/or kV according to patient size and/or use of iterative reconstruction technique. COMPARISON:  CT abdomen and pelvis 06/08/2023. MRI abdomen 06/08/2023. FINDINGS: Lower chest: Lung bases are clear. Hepatobiliary: No focal liver abnormality is seen. No gallstones, gallbladder wall thickening, or biliary dilatation. Pancreas: Calcifications in the head and body of the pancreas consistent with chronic pancreatitis. 4.2 cm diameter cystic lesion in the body of the pancreas and an additional smaller cystic lesions in the tail of the pancreas measuring up to 1 cm diameter in the tail of the pancreas. These lesions are unchanged since prior study. Lesions could represent pseudocyst or mucinous neoplasm such as IPMN. No edema or infiltration in the peripancreatic fat. Spleen: Normal in size without focal abnormality. Adrenals/Urinary Tract: Adrenal glands are unremarkable. Kidneys are normal, without renal calculi, focal lesion, or  hydronephrosis. Bladder wall is diffusely thickened, possibly indicating cystitis. Correlate with urinalysis. Stomach/Bowel: Under distention limits evaluation but there appears to be diffuse gastric wall thickening possibly gastritis or other infiltrating process. Menetrier's disease or neoplastic processes can have this appearance. Similar appearance to prior study. Small bowel and colon are not abnormally distended. No wall thickening or inflammatory stranding identified. Appendix is normal. Vascular/Lymphatic: Aortic atherosclerosis. No enlarged abdominal or pelvic lymph nodes. Reproductive: Prostate is unremarkable. Other: No abdominal wall hernia or abnormality. No abdominopelvic ascites. Musculoskeletal: No acute or significant osseous findings. IMPRESSION: 1. Pancreatic calcification consistent with chronic pancreatitis. 2. Stable appearance of pancreatic cystic lesions measuring up to 4.2 cm diameter, possibly pseudocyst or IPMN. 3. Diffuse gastric wall thickening similar to prior studies. Likely gastritis but other etiologies are not excluded. 4. Aortic atherosclerosis. 5. Mildly thickened bladder wall possibly cystitis. Correlate with urinalysis. Electronically Signed   By: Burman Nieves M.D.   On: 06/15/2023 01:52   DG Chest Portable 1 View Result Date: 06/14/2023 CLINICAL DATA:  Chest pain EXAM: PORTABLE CHEST 1 VIEW COMPARISON:  05/26/2023 FINDINGS: The heart size and mediastinal contours are within normal limits.  Both lungs are clear. The visualized skeletal structures are unremarkable. IMPRESSION: No active disease. Electronically Signed   By: Jasmine Pang M.D.   On: 06/14/2023 22:51    Physical Exam: There were no vitals taken for this visit. Constitutional: Pleasant,well-developed, ***male in no acute distress. HEENT: Normocephalic and atraumatic. Conjunctivae are normal. No scleral icterus. Neck supple.  Cardiovascular: Normal rate, regular rhythm.  Pulmonary/chest: Effort normal  and breath sounds normal. No wheezing, rales or rhonchi. Abdominal: Soft, nondistended, nontender. Bowel sounds active throughout. There are no masses palpable. No hepatomegaly. Extremities: no edema Lymphadenopathy: No cervical adenopathy noted. Neurological: Alert and oriented to person place and time. Skin: Skin is warm and dry. No rashes noted. Psychiatric: Normal mood and affect. Behavior is normal.   ASSESSMENT: 53 y.o. male here for assessment of the following  No diagnosis found.  PLAN:   Grayce Sessions, NP

## 2023-07-14 NOTE — Telephone Encounter (Signed)
Error

## 2023-07-21 ENCOUNTER — Telehealth: Payer: Self-pay | Admitting: Gastroenterology

## 2023-07-21 NOTE — Telephone Encounter (Signed)
 We find in necessary to inform you that [name] and all providers practicing at the clinic will no longer be able to provide medical care to you. We believe that our patient/provider relationship has been compromised and thus would request that you seek care elsewhere. 07/14/23

## 2023-07-28 ENCOUNTER — Other Ambulatory Visit: Payer: Self-pay

## 2023-07-28 ENCOUNTER — Emergency Department (HOSPITAL_COMMUNITY)
Admission: EM | Admit: 2023-07-28 | Discharge: 2023-07-29 | Disposition: A | Discharge status: Home / Self Care | Payer: MEDICAID | Attending: Emergency Medicine | Admitting: Emergency Medicine

## 2023-07-28 ENCOUNTER — Emergency Department (HOSPITAL_COMMUNITY): Payer: MEDICAID

## 2023-07-28 ENCOUNTER — Encounter (HOSPITAL_COMMUNITY): Payer: Self-pay | Admitting: Emergency Medicine

## 2023-07-28 DIAGNOSIS — K860 Alcohol-induced chronic pancreatitis: Secondary | ICD-10-CM | POA: Diagnosis not present

## 2023-07-28 DIAGNOSIS — R519 Headache, unspecified: Secondary | ICD-10-CM | POA: Diagnosis present

## 2023-07-28 DIAGNOSIS — Z91040 Latex allergy status: Secondary | ICD-10-CM | POA: Diagnosis not present

## 2023-07-28 DIAGNOSIS — J101 Influenza due to other identified influenza virus with other respiratory manifestations: Secondary | ICD-10-CM | POA: Diagnosis not present

## 2023-07-28 LAB — COMPREHENSIVE METABOLIC PANEL
ALT: 35 U/L (ref 0–44)
AST: 60 U/L — ABNORMAL HIGH (ref 15–41)
Albumin: 3.9 g/dL (ref 3.5–5.0)
Alkaline Phosphatase: 95 U/L (ref 38–126)
Anion gap: 11 (ref 5–15)
BUN: 9 mg/dL (ref 6–20)
CO2: 17 mmol/L — ABNORMAL LOW (ref 22–32)
Calcium: 7.7 mg/dL — ABNORMAL LOW (ref 8.9–10.3)
Chloride: 103 mmol/L (ref 98–111)
Creatinine, Ser: 0.98 mg/dL (ref 0.61–1.24)
GFR, Estimated: >60 mL/min (ref >60)
Glucose, Bld: 100 mg/dL — ABNORMAL HIGH (ref 70–99)
Potassium: 3.5 mmol/L (ref 3.5–5.1)
Sodium: 131 mmol/L — ABNORMAL LOW (ref 135–145)
Total Bilirubin: 0.5 mg/dL (ref 0.0–1.2)
Total Protein: 7.8 g/dL (ref 6.5–8.1)

## 2023-07-28 LAB — LIPASE, BLOOD: Lipase: 25 U/L (ref 11–51)

## 2023-07-28 LAB — URINALYSIS, ROUTINE W REFLEX MICROSCOPIC
Bilirubin Urine: NEGATIVE
Glucose, UA: NEGATIVE mg/dL
Hgb urine dipstick: NEGATIVE
Ketones, ur: NEGATIVE mg/dL
Leukocytes,Ua: NEGATIVE
Nitrite: NEGATIVE
Protein, ur: NEGATIVE mg/dL
Specific Gravity, Urine: 1.02 (ref 1.005–1.030)
pH: 6 (ref 5.0–8.0)

## 2023-07-28 LAB — I-STAT CG4 LACTIC ACID, ED: Lactic Acid, Venous: 1.9 mmol/L (ref 0.5–1.9)

## 2023-07-28 LAB — CBC
HCT: 31.5 % — ABNORMAL LOW (ref 39.0–52.0)
Hemoglobin: 9.8 g/dL — ABNORMAL LOW (ref 13.0–17.0)
MCH: 27.2 pg (ref 26.0–34.0)
MCHC: 31.1 g/dL (ref 30.0–36.0)
MCV: 87.5 fL (ref 80.0–100.0)
Platelets: 130 10*3/uL — ABNORMAL LOW (ref 150–400)
RBC: 3.6 MIL/uL — ABNORMAL LOW (ref 4.22–5.81)
RDW: 17.7 % — ABNORMAL HIGH (ref 11.5–15.5)
WBC: 8.2 10*3/uL (ref 4.0–10.5)
nRBC: 0.4 % — ABNORMAL HIGH (ref 0.0–0.2)

## 2023-07-28 LAB — ETHANOL: Alcohol, Ethyl (B): 16 mg/dL — ABNORMAL HIGH (ref <10)

## 2023-07-28 LAB — RESP PANEL BY RT-PCR (RSV, FLU A&B, COVID)  RVPGX2
Influenza A by PCR: POSITIVE — AB
Influenza B by PCR: NEGATIVE
Resp Syncytial Virus by PCR: NEGATIVE
SARS Coronavirus 2 by RT PCR: NEGATIVE

## 2023-07-28 LAB — RAPID URINE DRUG SCREEN, HOSP PERFORMED
Amphetamines: NOT DETECTED
Barbiturates: NOT DETECTED
Benzodiazepines: POSITIVE — AB
Cocaine: POSITIVE — AB
Opiates: NOT DETECTED
Tetrahydrocannabinol: POSITIVE — AB

## 2023-07-28 MED ORDER — ACETAMINOPHEN 325 MG PO TABS: 650.0000 mg | ORAL_TABLET | Freq: Once | ORAL | Status: DC | PRN

## 2023-07-28 MED ORDER — ALUM & MAG HYDROXIDE-SIMETH 200-200-20 MG/5ML PO SUSP
30.0000 mL | Freq: Once | ORAL | Status: AC
Start: 2023-07-28 — End: 2023-07-28
  Administered 2023-07-28: 30 mL via ORAL
  Filled 2023-07-28: qty 30

## 2023-07-28 MED ORDER — LORAZEPAM 2 MG/ML IJ SOLN
1.0000 mg | Freq: Once | INTRAMUSCULAR | Status: DC
Start: 2023-07-28 — End: 2023-07-28

## 2023-07-28 MED ORDER — LIDOCAINE VISCOUS HCL 2 % MT SOLN
15.0000 mL | Freq: Once | OROMUCOSAL | Status: AC
Start: 2023-07-28 — End: 2023-07-28
  Administered 2023-07-28: 15 mL via ORAL
  Filled 2023-07-28: qty 15

## 2023-07-28 MED ORDER — BENZONATATE 100 MG PO CAPS: 100.0000 mg | ORAL_CAPSULE | Freq: Three times a day (TID) | ORAL | 0 refills | Status: DC

## 2023-07-28 MED ORDER — ONDANSETRON HCL 4 MG/2ML IJ SOLN
4.0000 mg | Freq: Once | INTRAMUSCULAR | Status: AC
  Administered 2023-07-28: 4 mg via INTRAVENOUS
  Filled 2023-07-28: qty 2

## 2023-07-28 MED ORDER — OXYCODONE-ACETAMINOPHEN 5-325 MG PO TABS
1.0000 | ORAL_TABLET | Freq: Once | ORAL | Status: AC
  Administered 2023-07-28: 1 via ORAL
  Filled 2023-07-28: qty 1

## 2023-07-28 NOTE — ED Triage Notes (Signed)
 Pt in with 8/10 sharp and achy abdominal pain since yesterday. Pt states hx of pancreatitis, has temp of 102 on arrival. Reports runny nose, n/v/d and HA with blurred vision x 1 wk. VS en route: 190/80 110HR CBG 91 Temp 102

## 2023-07-28 NOTE — Discharge Instructions (Addendum)
 Please follow-up with your primary care provider regards recent ER visit. Today your labs and imaging are reassuring and you have the flu causing your symptoms. Please use Tylenol every 6 hours as needed for pain. Please remain hydrated and eat food as tolerated. I prescribed Tessalon to help with the cough. If symptoms change or worsen please return to the ER.

## 2023-07-28 NOTE — ED Provider Notes (Signed)
 [place] [facility] AT [facility] Provider Note   CSN: [id number] Arrival date & time: 07/28/23  2042     History  Chief Complaint  Patient presents with   Abdominal Pain   Headache    [name] is a 53 y.o. male History of alcohol induced chronic pancreatitis, GERD, seizures presented with epigastric pain along with a headache.  Patient states symptoms been going on for few days and that his abdominal pain is similar to previous epigastric episodes from his pancreatitis.  Patient states he is out of his oxycodone for his pancreatitis.  Patient denies any hematemesis but states he has had nausea vomiting diarrhea along with a headache and a fever of 102 at home.  Patient not take any medications for this.  Patient denies any chest pain or shortness of breath.  Patient denies dysuria.    Home Medications Prior to Admission medications   Medication Sig Start Date End Date Taking? Authorizing Provider  benzonatate (TESSALON) 100 MG capsule Take 1 capsule (100 mg total) by mouth every 8 (eight) hours. 07/28/23  Yes Jye Fariss, Beverly Gust, PA-C  bismuth subsalicylate (KAOPECTATE) 262 MG/15ML suspension Take 15 mLs by mouth every 6 (six) hours as needed for indigestion or diarrhea or loose stools.    [provider]  cyclobenzaprine (FLEXERIL) 10 MG tablet Take 1 tablet (10 mg total) by mouth 2 (two) times daily as needed for muscle spasms. 07/01/23   Tegeler, Canary Brim, MD  dicyclomine (BENTYL) 10 MG capsule Take 1 capsule (10 mg total) by mouth 4 (four) times daily -  before meals and at bedtime for 5 days. 06/20/23 06/25/23  Pokhrel, Rebekah Chesterfield, MD  fluticasone (FLONASE) 50 MCG/ACT nasal spray Place 2 sprays into both nostrils daily as needed for allergies.    [provider]  folic acid (FOLVITE) 1 MG tablet Take 1 tablet (1 mg total) by mouth daily. 09/21/22   Pokhrel, Laxman, MD  lidocaine (LIDODERM) 5 % Place 1 patch onto the skin daily. Remove  & Discard patch within 12 hours or as directed by MD 07/01/23   Tegeler, Canary Brim, MD  lidocaine (XYLOCAINE) 2 % solution Use as directed 15 mLs in the mouth or throat every 4 (four) hours as needed for mouth pain. 06/20/23   Pokhrel, Rebekah Chesterfield, MD  lipase/protease/amylase (CREON) 36000 UNITS CPEP capsule Take 2 capsules (72,000 Units total) by mouth 3 (three) times daily with meals. May also take 1 capsule (36,000 Units total) as needed (with snacks). Patient taking differently: Take 2 capsules (72,000 Units total) by mouth for breakfast and dinner , then take 1 capsule for lunch. May also take 1 capsule (36,000 Units total) as needed (with snacks). 01/26/23   Jerald Kief, MD  liver oil-zinc oxide (DESITIN) 40 % ointment Apply topically 2 (two) times daily. Apply to buttocks Patient taking differently: Apply 1 Application topically as needed for irritation. Apply to buttocks 03/07/23   Rodolph Bong, MD  loperamide (IMODIUM) 2 MG capsule Take 1 capsule (2 mg total) by mouth as needed for diarrhea or loose stools. Patient not taking: Reported on 05/26/2023 03/07/23   Rodolph Bong, MD  magic mouthwash SOLN Take 10 mLs by mouth 4 (four) times daily. 06/10/23   [provider]  magnesium oxide (MAG-OX) 400 (240 Mg) MG tablet Take 400 mg by mouth daily. 04/07/23   [provider]  metoprolol (TOPROL-XL) 200 MG 24 hr tablet Take 1 tablet (200 mg total) by mouth  daily. 03/07/23   Rodolph Bong, MD  nicotine (NICODERM CQ - DOSED IN MG/24 HOURS) 21 mg/24hr patch Place 1 patch (21 mg total) onto the skin daily. 06/20/23   Pokhrel, Rebekah Chesterfield, MD  nitroGLYCERIN (NITROSTAT) 0.4 MG SL tablet Place 0.4 mg under the tongue every 5 (five) minutes as needed for chest pain.    [provider]  ondansetron (ZOFRAN) 4 MG tablet Take 1 tablet (4 mg total) by mouth every 8 (eight) hours as needed for nausea or vomiting. 05/26/23   Dolphus Jenny, PA-C  oxyCODONE-acetaminophen (PERCOCET)  5-325 MG tablet Take 1 tablet by mouth every 6 (six) hours as needed for moderate pain (pain score 4-6) or severe pain (pain score 7-10). 06/20/23   Pokhrel, Rebekah Chesterfield, MD  pantoprazole (PROTONIX) 40 MG tablet Take 1 tablet (40 mg total) by mouth 2 (two) times daily. 05/26/23   Dolphus Jenny, PA-C  pregabalin (LYRICA) 100 MG capsule Take 100 mg by mouth 2 (two) times daily. 02/26/23   [provider]  Simethicone (GAS-X PO) Take 1 capsule by mouth 2 (two) times daily as needed (gas).    [provider]  sucralfate (CARAFATE) 1 GM/10ML suspension Take 10 mLs (1 g total) by mouth 4 (four) times daily -  with meals and at bedtime. 05/26/23   Dolphus Jenny, PA-C  amitriptyline (ELAVIL) 25 MG tablet Take 1 tablet (25 mg total) by mouth at bedtime. Patient not taking: Reported on 08/08/2019 10/15/18 08/08/19  Rolly Salter, MD      Allergies    Robaxin [methocarbamol], Aspirin, Shellfish-derived products, Trazodone and nefazodone, Adhesive [tape], Fish-derived products, Latex, Toradol [ketorolac tromethamine], Contrast media [iodinated contrast media], Reglan [metoclopramide], and Salmon [fish oil]    Review of Systems   Review of Systems  Gastrointestinal:  Positive for abdominal pain.  Neurological:  Positive for headaches.    Physical Exam Updated Vital Signs BP (!) 149/99 (BP Location: Left Arm)   Pulse (!) 56   Temp (!) 102.4 F (39.1 C) (Oral)   Resp (!) 28   Wt 59.5 kg   SpO2 100%   BMI 19.94 kg/m  Physical Exam Vitals reviewed.  Constitutional:      General: He is not in acute distress. HENT:     Head: Normocephalic and atraumatic.  Eyes:     Extraocular Movements: Extraocular movements intact.     Conjunctiva/sclera: Conjunctivae normal.     Pupils: Pupils are equal, round, and reactive to light.  Cardiovascular:     Rate and Rhythm: Normal rate and regular rhythm.     Pulses: Normal pulses.     Heart sounds: Normal heart sounds.     Comments: 2+ bilateral  radial/dorsalis pedis pulses with regular rate Pulmonary:     Effort: Pulmonary effort is normal. No respiratory distress.     Breath sounds: Normal breath sounds.  Abdominal:     Palpations: Abdomen is soft.     Tenderness: There is abdominal tenderness in the epigastric area. There is no guarding or rebound.  Musculoskeletal:        General: Normal range of motion.     Cervical back: Normal range of motion and neck supple.     Comments: 5 out of 5 bilateral grip/leg extension strength  Skin:    General: Skin is warm and dry.     Capillary Refill: Capillary refill takes less than 2 seconds.  Neurological:     General: No focal deficit present.  Mental Status: He is alert and oriented to person, place, and time.     Comments: Sensation intact in all 4 limbs  Psychiatric:        Mood and Affect: Mood normal.     ED Results / Procedures / Treatments   Labs (all labs ordered are listed, but only abnormal results are displayed) Labs Reviewed  RESP PANEL BY RT-PCR (RSV, FLU A&B, COVID)  RVPGX2 - Abnormal; Notable for the following components:      Result Value   Influenza A by PCR POSITIVE (*)    All other components within normal limits  COMPREHENSIVE METABOLIC PANEL - Abnormal; Notable for the following components:   Sodium 131 (*)    CO2 17 (*)    Glucose, Bld 100 (*)    Calcium 7.7 (*)    AST 60 (*)    All other components within normal limits  CBC - Abnormal; Notable for the following components:   RBC 3.60 (*)    Hemoglobin 9.8 (*)    HCT 31.5 (*)    RDW 17.7 (*)    Platelets 130 (*)    nRBC 0.4 (*)    All other components within normal limits  RAPID URINE DRUG SCREEN, HOSP PERFORMED - Abnormal; Notable for the following components:   Cocaine POSITIVE (*)    Benzodiazepines POSITIVE (*)    Tetrahydrocannabinol POSITIVE (*)    All other components within normal limits  ETHANOL - Abnormal; Notable for the following components:   Alcohol, Ethyl (B) 16 (*)    All  other components within normal limits  LIPASE, BLOOD  URINALYSIS, ROUTINE W REFLEX MICROSCOPIC  I-STAT CG4 LACTIC ACID, ED    EKG None  Radiology CT ABDOMEN PELVIS WO CONTRAST Result Date: 07/28/2023 CLINICAL DATA:  Abdomen pain EXAM: CT ABDOMEN AND PELVIS WITHOUT CONTRAST TECHNIQUE: Multidetector CT imaging of the abdomen and pelvis was performed following the standard protocol without IV contrast. RADIATION DOSE REDUCTION: This exam was performed according to the departmental dose-optimization program which includes automated exposure control, adjustment of the mA and/or kV according to patient size and/or use of iterative reconstruction technique. COMPARISON:  CT 06/15/2023, 06/08/2023, 01/25/2023, MRI 06/08/2023 FINDINGS: Lower chest: Lung bases demonstrate no acute airspace disease. Hepatobiliary: Gallbladder appears contracted. No calcified stones or biliary dilatation Pancreas: Scattered calcifications consistent with chronic pancreatitis. Cystic lesions at the distal pancreas measuring up to 4.1 x 3.6 cm, previously 4.2 cm. Spleen: Normal in size without focal abnormality. Adrenals/Urinary Tract: Adrenal glands are normal. Kidneys show no hydronephrosis. Urinary bladder is unremarkable. Stomach/Bowel: Gastric wall thickening as seen on prior exams. Fluid-filled small bowel without obstruction or bowel wall thickening. Vascular/Lymphatic: Aortic atherosclerosis. No enlarged abdominal or pelvic lymph nodes. Reproductive: Prostate is unremarkable. Other: Negative for pelvic effusion or free air Musculoskeletal: No acute or suspicious osseous abnormality IMPRESSION: 1. Findings consistent with chronic pancreatitis. No acute peripancreatic inflammation. Stable cystic lesions at the distal pancreas measuring up to 4.1 cm, previously 4.2 cm. 2. Redemonstrated gastric wall thickening as seen on prior exams. 3. Fluid-filled nondilated small bowel without obstruction or bowel wall thickening. Electronically  Signed   By: Jasmine Pang M.D.   On: 07/28/2023 23:05   DG Chest 2 View Result Date: 07/28/2023 CLINICAL DATA:  Fever. EXAM: CHEST - 2 VIEW COMPARISON:  07/01/2023, CT 03/11/2023 FINDINGS: The cardiomediastinal contours are normal. Right apical blebs. Pulmonary vasculature is normal. No consolidation, pleural effusion, or pneumothorax. No acute osseous abnormalities are seen. IMPRESSION: 1. No  acute chest findings. 2. Right apical blebs. Electronically Signed   By: Narda Rutherford M.D.   On: 07/28/2023 21:58    Procedures Procedures    Medications Ordered in ED Medications  acetaminophen (TYLENOL) tablet 650 mg (has no administration in time range)  alum & mag hydroxide-simeth (MAALOX/MYLANTA) 200-200-20 MG/5ML suspension 30 mL (30 mLs Oral Given 07/28/23 2316)    And  lidocaine (XYLOCAINE) 2 % viscous mouth solution 15 mL (15 mLs Oral Given 07/28/23 2316)  ondansetron (ZOFRAN) injection 4 mg (4 mg Intravenous Given 07/28/23 2314)  oxyCODONE-acetaminophen (PERCOCET/ROXICET) 5-325 MG per tablet 1 tablet (1 tablet Oral Given 07/28/23 2316)    ED Course/ Medical Decision Making/ A&P                                 Medical Decision Making Amount and/or Complexity of Data Reviewed Labs: ordered.  Risk OTC drugs. Prescription drug management.   [name] 53 y.o. presented today for abdominal pain.  Working DDx that I considered at this time includes, but not limited to, gastroenteritis, colitis, small bowel obstruction, appendicitis, cholecystitis, hepatobiliary pathology, gastritis, PUD, ACS, aortic dissection, diverticulosis/diverticulitis, pancreatitis, nephrolithiasis, medication induced, AAA, UTI, pyelonephritis, testicular torsion.  R/o DDx: gastroenteritis, colitis, small bowel obstruction, appendicitis, cholecystitis, hepatobiliary pathology, gastritis, PUD, ACS, aortic dissection, diverticulosis/diverticulitis, nephrolithiasis, medication induced, AAA, UTI,  pyelonephritis, testicular torsion: These are considered less likely due to history of present illness, physical exam, labs/imaging findings.  Review of prior external notes: 06/14/2023 ED  Unique Tests and My Independent Interpretation:  CBC with differential: Unremarkable CMP: Unremarkable Lipase: Unremarkable UA: Unremarkable CT Abd/Pelvis without contrast: No acute pathology Lactic acid: Negative UDS: Cocaine benzo THC positive Ethanol: 16  Social Determinants of Health: EtOH/Substance Abuse  Discussion with Independent Historian: None  Discussion of Management of Tests: None  Risk: Medium: prescription drug management  Risk Stratification Score: None  Plan: On exam patient was no acute distress but noted to be febrile upon arrival.  Patient was given Tylenol and so we will recheck temperature.  On exam patient does have generalized epigastric tenderness more so in the epigastric region and patient states this is his pancreatitis and requests GI cocktail along with Zofran and Percocet as this has helped in the past we will order these.  At this time patient's labs from imaging do show influenza A which I do suspect is causing his fever.  Will get the CT scan as already ordered and p.o. challenge and if patient stable can discharge.  CT scan shows no acute findings.  Will plan on p.o. challenge.  Patient is able to tolerate fluids p.o. and his pain is gone better with medications here and so at this time he is safe to be discharged.  Temperature did improve with the Tylenol.  Did encourage patient to use Tylenol every 6 hours needed for pain and to remain hydrated.  Patient request Tessalon for his cough which is reasonable and so we will order this as well.  I recommended patient follow-up with his pain management clinic as he states he is out of his oxycodone which patient agreed.  Patient was given return precautions. Patient stable for discharge at this time.  Patient verbalized  understanding of plan.  This chart was dictated using voice recognition software.  Despite best efforts to proofread,  errors can occur which can change the documentation meaning.  Final Clinical Impression(s) / ED Diagnoses Final diagnoses:  Alcohol-induced chronic pancreatitis (HCC)  Influenza A    Rx / DC Orders ED Discharge Orders          Ordered    benzonatate (TESSALON) 100 MG capsule  Every 8 hours        07/28/23 2334              Remi Deter 07/28/23 2350    Jacalyn Lefevre, MD 07/29/23 [phone]

## 2023-07-28 NOTE — ED Provider Triage Note (Signed)
 Emergency Medicine Provider Triage Evaluation Note  [name] , a 53 y.o. male  was evaluated in triage.  Pt complains of abdominal pain.  Patient has a history of polysubstance use disorder chronic pancreatitis alcohol use, seizures, headaches..  Patient states he feels like his chronic pancreatitis is flaring up.  He started having symptoms of nausea and vomiting.  He is also having pain in his upper abdomen.  Patient states he is also had a headache for the last week.  On arrival patient noted to have a fever  Review of Systems  Positive: Abdominal pain headache Negative: Recent injuries  Physical Exam  BP (!) 149/99 (BP Location: Left Arm)   Pulse (!) 56   Temp (!) 102.9 F (39.4 C) (Oral)   Resp (!) 28   Wt 59.5 kg   SpO2 100%   BMI 19.94 kg/m  Gen:   Awake, no distress   Resp:  Normal effort  MSK:   Moves extremities without difficulty  Other:  Abd ttp epiastric region  Medical Decision Making  Medically screening exam initiated at 9:21 PM.  Appropriate orders placed.  [name] was informed that the remainder of the evaluation will be completed by another provider, this initial triage assessment does not replace that evaluation, and the importance of remaining in the ED until their evaluation is complete.  Patient presented to the ED with complaints of recurrent abdominal pain.  Patient has history of similar episodes in the past.  Patient's been to the ED 16 times in the last 6 months.  Patient however does have a fever up to 102.9.  Will evaluate for possible pneumonia, evolving sepsis, influenza or acute abdominal infectious etiology.   Jason Dibbles, MD 07/28/23 2124

## 2023-08-10 ENCOUNTER — Inpatient Hospital Stay (HOSPITAL_COMMUNITY)
Admission: EM | Admit: 2023-08-10 | Discharge: 2023-08-21 | DRG: 682 | Disposition: A | Discharge status: Home / Self Care | Payer: MEDICAID | Attending: Internal Medicine | Admitting: Internal Medicine

## 2023-08-10 ENCOUNTER — Encounter (HOSPITAL_COMMUNITY): Payer: Self-pay | Admitting: Emergency Medicine

## 2023-08-10 ENCOUNTER — Emergency Department (HOSPITAL_COMMUNITY): Payer: MEDICAID

## 2023-08-10 ENCOUNTER — Other Ambulatory Visit: Payer: Self-pay

## 2023-08-10 ENCOUNTER — Telehealth (INDEPENDENT_AMBULATORY_CARE_PROVIDER_SITE_OTHER): Payer: Self-pay | Admitting: Primary Care

## 2023-08-10 DIAGNOSIS — K909 Intestinal malabsorption, unspecified: Secondary | ICD-10-CM | POA: Diagnosis present

## 2023-08-10 DIAGNOSIS — I456 Pre-excitation syndrome: Secondary | ICD-10-CM | POA: Diagnosis present

## 2023-08-10 DIAGNOSIS — K863 Pseudocyst of pancreas: Secondary | ICD-10-CM | POA: Diagnosis present

## 2023-08-10 DIAGNOSIS — F1410 Cocaine abuse, uncomplicated: Secondary | ICD-10-CM | POA: Diagnosis present

## 2023-08-10 DIAGNOSIS — I10 Essential (primary) hypertension: Secondary | ICD-10-CM | POA: Diagnosis present

## 2023-08-10 DIAGNOSIS — Z5971 Insufficient health insurance coverage: Secondary | ICD-10-CM

## 2023-08-10 DIAGNOSIS — N139 Obstructive and reflux uropathy, unspecified: Secondary | ICD-10-CM | POA: Diagnosis present

## 2023-08-10 DIAGNOSIS — Z886 Allergy status to analgesic agent status: Secondary | ICD-10-CM

## 2023-08-10 DIAGNOSIS — J4489 Other specified chronic obstructive pulmonary disease: Secondary | ICD-10-CM | POA: Diagnosis present

## 2023-08-10 DIAGNOSIS — Z832 Family history of diseases of the blood and blood-forming organs and certain disorders involving the immune mechanism: Secondary | ICD-10-CM

## 2023-08-10 DIAGNOSIS — K219 Gastro-esophageal reflux disease without esophagitis: Secondary | ICD-10-CM | POA: Diagnosis present

## 2023-08-10 DIAGNOSIS — K8689 Other specified diseases of pancreas: Secondary | ICD-10-CM | POA: Diagnosis present

## 2023-08-10 DIAGNOSIS — K5289 Other specified noninfective gastroenteritis and colitis: Secondary | ICD-10-CM | POA: Diagnosis present

## 2023-08-10 DIAGNOSIS — E860 Dehydration: Secondary | ICD-10-CM | POA: Diagnosis present

## 2023-08-10 DIAGNOSIS — F319 Bipolar disorder, unspecified: Secondary | ICD-10-CM | POA: Diagnosis present

## 2023-08-10 DIAGNOSIS — Z91041 Radiographic dye allergy status: Secondary | ICD-10-CM

## 2023-08-10 DIAGNOSIS — D509 Iron deficiency anemia, unspecified: Secondary | ICD-10-CM | POA: Diagnosis present

## 2023-08-10 DIAGNOSIS — Z6372 Alcoholism and drug addiction in family: Secondary | ICD-10-CM

## 2023-08-10 DIAGNOSIS — F1210 Cannabis abuse, uncomplicated: Secondary | ICD-10-CM | POA: Diagnosis present

## 2023-08-10 DIAGNOSIS — K2970 Gastritis, unspecified, without bleeding: Secondary | ICD-10-CM | POA: Diagnosis present

## 2023-08-10 DIAGNOSIS — F17290 Nicotine dependence, other tobacco product, uncomplicated: Secondary | ICD-10-CM | POA: Diagnosis present

## 2023-08-10 DIAGNOSIS — E8729 Other acidosis: Secondary | ICD-10-CM | POA: Diagnosis present

## 2023-08-10 DIAGNOSIS — Z79899 Other long term (current) drug therapy: Secondary | ICD-10-CM

## 2023-08-10 DIAGNOSIS — Z91048 Other nonmedicinal substance allergy status: Secondary | ICD-10-CM

## 2023-08-10 DIAGNOSIS — D72828 Other elevated white blood cell count: Secondary | ICD-10-CM | POA: Diagnosis present

## 2023-08-10 DIAGNOSIS — F419 Anxiety disorder, unspecified: Secondary | ICD-10-CM | POA: Diagnosis present

## 2023-08-10 DIAGNOSIS — R197 Diarrhea, unspecified: Secondary | ICD-10-CM | POA: Diagnosis not present

## 2023-08-10 DIAGNOSIS — E875 Hyperkalemia: Secondary | ICD-10-CM | POA: Diagnosis not present

## 2023-08-10 DIAGNOSIS — N179 Acute kidney failure, unspecified: Principal | ICD-10-CM | POA: Diagnosis present

## 2023-08-10 DIAGNOSIS — F1020 Alcohol dependence, uncomplicated: Secondary | ICD-10-CM | POA: Diagnosis present

## 2023-08-10 DIAGNOSIS — Z8249 Family history of ischemic heart disease and other diseases of the circulatory system: Secondary | ICD-10-CM

## 2023-08-10 DIAGNOSIS — F4310 Post-traumatic stress disorder, unspecified: Secondary | ICD-10-CM | POA: Diagnosis present

## 2023-08-10 DIAGNOSIS — Z811 Family history of alcohol abuse and dependence: Secondary | ICD-10-CM

## 2023-08-10 DIAGNOSIS — Z888 Allergy status to other drugs, medicaments and biological substances status: Secondary | ICD-10-CM

## 2023-08-10 DIAGNOSIS — D573 Sickle-cell trait: Secondary | ICD-10-CM | POA: Diagnosis present

## 2023-08-10 DIAGNOSIS — Z8701 Personal history of pneumonia (recurrent): Secondary | ICD-10-CM

## 2023-08-10 DIAGNOSIS — E8721 Acute metabolic acidosis: Secondary | ICD-10-CM | POA: Diagnosis present

## 2023-08-10 DIAGNOSIS — K860 Alcohol-induced chronic pancreatitis: Secondary | ICD-10-CM | POA: Diagnosis present

## 2023-08-10 DIAGNOSIS — Z91013 Allergy to seafood: Secondary | ICD-10-CM

## 2023-08-10 DIAGNOSIS — K859 Acute pancreatitis without necrosis or infection, unspecified: Secondary | ICD-10-CM | POA: Diagnosis present

## 2023-08-10 DIAGNOSIS — E7800 Pure hypercholesterolemia, unspecified: Secondary | ICD-10-CM | POA: Diagnosis present

## 2023-08-10 DIAGNOSIS — Z8481 Family history of carrier of genetic disease: Secondary | ICD-10-CM

## 2023-08-10 DIAGNOSIS — I480 Paroxysmal atrial fibrillation: Secondary | ICD-10-CM | POA: Diagnosis present

## 2023-08-10 DIAGNOSIS — Z8711 Personal history of peptic ulcer disease: Secondary | ICD-10-CM

## 2023-08-10 DIAGNOSIS — F17210 Nicotine dependence, cigarettes, uncomplicated: Secondary | ICD-10-CM | POA: Diagnosis present

## 2023-08-10 DIAGNOSIS — Z91040 Latex allergy status: Secondary | ICD-10-CM

## 2023-08-10 LAB — COMPREHENSIVE METABOLIC PANEL WITH GFR
ALT: 29 U/L (ref 0–44)
AST: 53 U/L — ABNORMAL HIGH (ref 15–41)
Albumin: 4.2 g/dL (ref 3.5–5.0)
Alkaline Phosphatase: 108 U/L (ref 38–126)
Anion gap: 19 — ABNORMAL HIGH (ref 5–15)
BUN: 21 mg/dL — ABNORMAL HIGH (ref 6–20)
CO2: 9 mmol/L — ABNORMAL LOW (ref 22–32)
Calcium: 7.6 mg/dL — ABNORMAL LOW (ref 8.9–10.3)
Chloride: 104 mmol/L (ref 98–111)
Creatinine, Ser: 1.87 mg/dL — ABNORMAL HIGH (ref 0.61–1.24)
GFR, Estimated: 42 mL/min — ABNORMAL LOW (ref >60)
Glucose, Bld: 75 mg/dL (ref 70–99)
Potassium: 3.7 mmol/L (ref 3.5–5.1)
Sodium: 132 mmol/L — ABNORMAL LOW (ref 135–145)
Total Bilirubin: 1.6 mg/dL — ABNORMAL HIGH (ref 0.0–1.2)
Total Protein: 8.6 g/dL — ABNORMAL HIGH (ref 6.5–8.1)

## 2023-08-10 LAB — I-STAT VENOUS BLOOD GAS, ED
Acid-base deficit: 14 mmol/L — ABNORMAL HIGH (ref 0.0–2.0)
Bicarbonate: 14.4 mmol/L — ABNORMAL LOW (ref 20.0–28.0)
Calcium, Ion: 0.96 mmol/L — ABNORMAL LOW (ref 1.15–1.40)
HCT: 44 % (ref 39.0–52.0)
Hemoglobin: 15 g/dL (ref 13.0–17.0)
O2 Saturation: 91 %
Potassium: 3.5 mmol/L (ref 3.5–5.1)
Sodium: 134 mmol/L — ABNORMAL LOW (ref 135–145)
TCO2: 16 mmol/L — ABNORMAL LOW (ref 22–32)
pCO2, Ven: 39.7 mmHg — ABNORMAL LOW (ref 44–60)
pH, Ven: 7.168 — CL (ref 7.25–7.43)
pO2, Ven: 78 mmHg — ABNORMAL HIGH (ref 32–45)

## 2023-08-10 LAB — TROPONIN I (HIGH SENSITIVITY)
Troponin I (High Sensitivity): 12 ng/L (ref <18)
Troponin I (High Sensitivity): 14 ng/L (ref <18)

## 2023-08-10 LAB — CBC
HCT: 36.7 % — ABNORMAL LOW (ref 39.0–52.0)
Hemoglobin: 11.3 g/dL — ABNORMAL LOW (ref 13.0–17.0)
MCH: 26.8 pg (ref 26.0–34.0)
MCHC: 30.8 g/dL (ref 30.0–36.0)
MCV: 87.2 fL (ref 80.0–100.0)
Platelets: 216 10*3/uL (ref 150–400)
RBC: 4.21 MIL/uL — ABNORMAL LOW (ref 4.22–5.81)
RDW: 17.6 % — ABNORMAL HIGH (ref 11.5–15.5)
WBC: 14.2 10*3/uL — ABNORMAL HIGH (ref 4.0–10.5)
nRBC: 0 % (ref 0.0–0.2)

## 2023-08-10 LAB — LIPASE, BLOOD: Lipase: 21 U/L (ref 11–51)

## 2023-08-10 LAB — BETA-HYDROXYBUTYRIC ACID: Beta-Hydroxybutyric Acid: 0.74 mmol/L — ABNORMAL HIGH (ref 0.05–0.27)

## 2023-08-10 LAB — I-STAT CG4 LACTIC ACID, ED: Lactic Acid, Venous: 1.6 mmol/L (ref 0.5–1.9)

## 2023-08-10 MED ORDER — ACETAMINOPHEN 650 MG RE SUPP: 650.0000 mg | Freq: Four times a day (QID) | RECTAL | Status: DC | PRN

## 2023-08-10 MED ORDER — PANCRELIPASE (LIP-PROT-AMYL) 36000-114000 UNITS PO CPEP
72000.0000 [IU] | ORAL_CAPSULE | Freq: Three times a day (TID) | ORAL | Status: DC
  Administered 2023-08-11 – 2023-08-21 (×31): 72000 [IU] via ORAL
  Filled 2023-08-10 (×34): qty 2

## 2023-08-10 MED ORDER — PANCRELIPASE (LIP-PROT-AMYL) 36000-114000 UNITS PO CPEP
36000.0000 [IU] | ORAL_CAPSULE | ORAL | Status: DC | PRN
  Administered 2023-08-12 – 2023-08-19 (×2): 36000 [IU] via ORAL
  Filled 2023-08-10: qty 1

## 2023-08-10 MED ORDER — ACETAMINOPHEN 325 MG PO TABS
650.0000 mg | ORAL_TABLET | Freq: Four times a day (QID) | ORAL | Status: DC | PRN
  Administered 2023-08-10 – 2023-08-19 (×10): 650 mg via ORAL
  Filled 2023-08-10 (×10): qty 2

## 2023-08-10 MED ORDER — METOPROLOL SUCCINATE ER 100 MG PO TB24
200.0000 mg | ORAL_TABLET | Freq: Every day | ORAL | Status: DC
  Administered 2023-08-11 – 2023-08-21 (×11): 200 mg via ORAL
  Filled 2023-08-10 (×4): qty 2
  Filled 2023-08-10: qty 1
  Filled 2023-08-10 (×7): qty 2

## 2023-08-10 MED ORDER — ADULT MULTIVITAMIN W/MINERALS CH
1.0000 | ORAL_TABLET | Freq: Every day | ORAL | Status: DC
  Administered 2023-08-10 – 2023-08-21 (×12): 1 via ORAL
  Filled 2023-08-10 (×11): qty 1

## 2023-08-10 MED ORDER — HYDROMORPHONE HCL 1 MG/ML IJ SOLN
0.5000 mg | INTRAMUSCULAR | Status: DC | PRN
  Administered 2023-08-10 – 2023-08-14 (×21): 0.5 mg via INTRAVENOUS
  Filled 2023-08-10 (×22): qty 0.5

## 2023-08-10 MED ORDER — BISACODYL 5 MG PO TBEC: 5.0000 mg | DELAYED_RELEASE_TABLET | Freq: Every day | ORAL | Status: DC | PRN

## 2023-08-10 MED ORDER — PANTOPRAZOLE SODIUM 40 MG PO TBEC
40.0000 mg | DELAYED_RELEASE_TABLET | Freq: Two times a day (BID) | ORAL | Status: DC
  Administered 2023-08-10 – 2023-08-13 (×7): 40 mg via ORAL
  Filled 2023-08-10 (×7): qty 1

## 2023-08-10 MED ORDER — ALBUTEROL SULFATE (2.5 MG/3ML) 0.083% IN NEBU: 2.5000 mg | INHALATION_SOLUTION | Freq: Four times a day (QID) | RESPIRATORY_TRACT | Status: DC | PRN

## 2023-08-10 MED ORDER — METOPROLOL SUCCINATE ER 25 MG PO TB24
200.0000 mg | ORAL_TABLET | Freq: Once | ORAL | Status: AC
  Administered 2023-08-10: 200 mg via ORAL
  Filled 2023-08-10: qty 8

## 2023-08-10 MED ORDER — POLYETHYLENE GLYCOL 3350 17 G PO PACK: 17.0000 g | PACK | Freq: Every day | ORAL | Status: DC | PRN

## 2023-08-10 MED ORDER — HYDRALAZINE HCL 20 MG/ML IJ SOLN: 10.0000 mg | Freq: Four times a day (QID) | INTRAMUSCULAR | Status: DC | PRN

## 2023-08-10 MED ORDER — LORAZEPAM 1 MG PO TABS
1.0000 mg | ORAL_TABLET | ORAL | Status: AC | PRN
  Administered 2023-08-10: 1 mg via ORAL
  Filled 2023-08-10: qty 1

## 2023-08-10 MED ORDER — LORAZEPAM 2 MG/ML IJ SOLN
1.0000 mg | INTRAMUSCULAR | Status: AC | PRN
  Administered 2023-08-11 – 2023-08-12 (×2): 2 mg via INTRAVENOUS
  Filled 2023-08-10 (×2): qty 1

## 2023-08-10 MED ORDER — HYDROMORPHONE HCL 1 MG/ML IJ SOLN
1.0000 mg | Freq: Once | INTRAMUSCULAR | Status: AC
  Administered 2023-08-10: 1 mg via INTRAVENOUS
  Filled 2023-08-10: qty 1

## 2023-08-10 MED ORDER — SODIUM CHLORIDE 0.9 % IV BOLUS
1000.0000 mL | Freq: Once | INTRAVENOUS | Status: AC
Start: 2023-08-10 — End: 2023-08-11
  Administered 2023-08-10: 1000 mL via INTRAVENOUS

## 2023-08-10 MED ORDER — SODIUM CHLORIDE 0.45 % IV SOLN
INTRAVENOUS | Status: AC
  Filled 2023-08-10 (×3): qty 75

## 2023-08-10 MED ORDER — LOPERAMIDE HCL 2 MG PO CAPS
2.0000 mg | ORAL_CAPSULE | Freq: Four times a day (QID) | ORAL | Status: AC | PRN
  Administered 2023-08-10 – 2023-08-11 (×2): 2 mg via ORAL
  Filled 2023-08-10 (×2): qty 1

## 2023-08-10 MED ORDER — ENOXAPARIN SODIUM 40 MG/0.4ML IJ SOSY
40.0000 mg | PREFILLED_SYRINGE | INTRAMUSCULAR | Status: DC
  Administered 2023-08-10 – 2023-08-12 (×3): 40 mg via SUBCUTANEOUS
  Filled 2023-08-10 (×3): qty 0.4

## 2023-08-10 MED ORDER — ONDANSETRON HCL 4 MG/2ML IJ SOLN
4.0000 mg | Freq: Four times a day (QID) | INTRAMUSCULAR | Status: DC | PRN
  Administered 2023-08-12 – 2023-08-20 (×6): 4 mg via INTRAVENOUS
  Filled 2023-08-10 (×6): qty 2

## 2023-08-10 MED ORDER — THIAMINE MONONITRATE 100 MG PO TABS
100.0000 mg | ORAL_TABLET | Freq: Every day | ORAL | Status: DC
  Administered 2023-08-10 – 2023-08-11 (×2): 100 mg via ORAL
  Filled 2023-08-10 (×2): qty 1

## 2023-08-10 MED ORDER — ONDANSETRON HCL 4 MG/2ML IJ SOLN
4.0000 mg | Freq: Once | INTRAMUSCULAR | Status: AC
  Administered 2023-08-10: 4 mg via INTRAVENOUS
  Filled 2023-08-10: qty 2

## 2023-08-10 MED ORDER — FOLIC ACID 1 MG PO TABS
1.0000 mg | ORAL_TABLET | Freq: Every day | ORAL | Status: DC
  Administered 2023-08-10 – 2023-08-21 (×12): 1 mg via ORAL
  Filled 2023-08-10 (×12): qty 1

## 2023-08-10 MED ORDER — THIAMINE HCL 100 MG/ML IJ SOLN
100.0000 mg | Freq: Every day | INTRAMUSCULAR | Status: DC
  Filled 2023-08-10: qty 2

## 2023-08-10 MED ORDER — SODIUM CHLORIDE 0.9 % IV BOLUS
1000.0000 mL | Freq: Once | INTRAVENOUS | Status: AC
  Administered 2023-08-10: 1000 mL via INTRAVENOUS

## 2023-08-10 NOTE — ED Triage Notes (Signed)
 Pt BIBA from home w/ c/o chest pain 8/10 that radiates down jaw and left arm. Pt also r/p of abdominal pain d/t hx of pancreatitis/ [employer]. EMS did not give ASA bc pt stated he can not take it and no nitroglycerin was given d/t recent Cialis intake.   EMS gave 120cc of amio given  18G established in L forearm

## 2023-08-10 NOTE — H&P (Signed)
 [facility] History and Physical  [name] EGB:[id number] DOB: 14-Mar-1971 DOA: 08/10/2023 PCP: Grayce Sessions, NP  Presented from: Home Chief Complaint: Abdominal pain, nausea, vomiting, diarrhea  History of Present Illness: [name] is a 53 y.o. male with PMH significant for chronic alcoholism, chronic pancreatitis with pancreatic pseudocyst, polysubstance abuse (tobacco, marijuana, cocaine), HTN, paroxysmal A-fib not on anticoagulation, WPW syndrome s/p ablation, GERD, bipolar disorder, anxiety/depression, h/o suicidal ideation.  Most recently hospitalized 10/1 to 10/5 for intractable abdominal pain secondary to chronic pancreatitis.  Patient presented to the ED today from home with complaint of abdominal pain, nausea, vomiting, diarrhea.  He has chronic diarrhea related to pancreatitis. Reports compliance to Creon and other meds. From last time, he had multiple episodes of vomiting and diarrhea.  As it was abdominal cramp, unable to hold food down. No fever, no blood in vomitus or stool. Smokes about 15 cigarettes a day Says he is slowly cutting back on drinking and now drinks 6 pack beer every other day. Last use of cocaine was 3 days ago Lives at home with his mom.  Able to ambulate independently  In the ED, temperature 99, tachycardic to 130s, blood pressure in 140s, breathing on room Labs with WBC count 14.2, hemoglobin 11.3, sodium 132, bicarb 9, BUN/creatinine 21/1.87 VBG with pH 7.17, pCO2 40, bicarb 14 Lipase level normal Patient was given IV hydration, IV Dilaudid, IV Zofran Hospitalist service was consulted for inpatient management  At the time of my evaluation, patient was sitting up in ED gurney.  Curled forward with pain. States he had 2 episodes of vomiting in the ED. History reviewed and detailed as above.  Review of Systems:  All systems were reviewed and were negative unless otherwise mentioned in the HPI   Past medical  history: Past Medical History:  Diagnosis Date   Alcoholism /alcohol abuse    Anemia    Anxiety    Arthritis    knees; arms; elbows (03/26/2015)   Asthma    Bipolar disorder (HCC)    Chronic bronchitis (HCC)    Chronic lower back pain    Chronic pancreatitis (HCC)    Cocaine abuse (HCC)    Depression    Family history of adverse reaction to anesthesia    Femoral condyle fracture (HCC) 03/08/2014   left medial/notes 03/09/2014   GERD (gastroesophageal reflux disease)    H/O hiatal hernia    H/O suicide attempt 10/2012   High cholesterol    History of blood transfusion 10/2012   when I tried to commit suicide   History of stomach ulcers    Hypertension    Marijuana abuse, continuous    Migraine    a few times/year (03/26/2015)   Pancreatitis    Pneumonia 1990's X 3   PTSD (post-traumatic stress disorder)    Seizures (HCC)    Sickle cell trait (HCC)    WPW (Wolff-Parkinson-White syndrome)    [name] 03/06/2013    Past surgical history: Past Surgical History:  Procedure Laterality Date   BIOPSY  11/25/2017   Procedure: BIOPSY;  Surgeon: [name] Modena, MD;  Location: [facility] ENDOSCOPY;  Service: Endoscopy;;   BIOPSY  10/14/2018   Procedure: BIOPSY;  Surgeon: [name] Modena, MD;  Location: [facility] ENDOSCOPY;  Service: Endoscopy;;   BIOPSY  03/06/2023   Procedure: BIOPSY;  Surgeon: Lemar Lofty., MD;  Location: [facility] ENDOSCOPY;  Service: Gastroenterology;;   CARDIAC CATHETERIZATION     CYST ENTEROSTOMY  01/02/2020   Procedure: CYST ASPIRATION;  Surgeon: Rachael Fee, MD;  Location: [facility] ENDOSCOPY;  Service: Endoscopy;;   ESOPHAGOGASTRODUODENOSCOPY N/A 03/06/2023   Procedure: ESOPHAGOGASTRODUODENOSCOPY (EGD);  Surgeon: Lemar Lofty., MD;  Location: [facility] ENDOSCOPY;  Service: Gastroenterology;  Laterality: N/A;   ESOPHAGOGASTRODUODENOSCOPY (EGD) WITH PROPOFOL N/A 11/25/2017   Procedure: ESOPHAGOGASTRODUODENOSCOPY (EGD) WITH PROPOFOL;  Surgeon: [name] Modena, MD;   Location: [facility] ENDOSCOPY;  Service: Endoscopy;  Laterality: N/A;   ESOPHAGOGASTRODUODENOSCOPY (EGD) WITH PROPOFOL Left 10/14/2018   Procedure: ESOPHAGOGASTRODUODENOSCOPY (EGD) WITH PROPOFOL;  Surgeon: [name] Modena, MD;  Location: [facility] ENDOSCOPY;  Service: Endoscopy;  Laterality: Left;   ESOPHAGOGASTRODUODENOSCOPY (EGD) WITH PROPOFOL N/A 11/14/2018   Procedure: ESOPHAGOGASTRODUODENOSCOPY (EGD) WITH PROPOFOL;  Surgeon: Carman Ching, MD;  Location: [facility] ENDOSCOPY;  Service: Gastroenterology;  Laterality: N/A;   ESOPHAGOGASTRODUODENOSCOPY (EGD) WITH PROPOFOL N/A 01/02/2020   Procedure: ESOPHAGOGASTRODUODENOSCOPY (EGD) WITH PROPOFOL;  Surgeon: Rachael Fee, MD;  Location: [facility] ENDOSCOPY;  Service: Endoscopy;  Laterality: N/A;   ESOPHAGOGASTRODUODENOSCOPY (EGD) WITH PROPOFOL N/A 10/25/2020   Procedure: ESOPHAGOGASTRODUODENOSCOPY (EGD) WITH PROPOFOL;  Surgeon: Meridee Score Netty Starring., MD;  Location: [facility] ENDOSCOPY;  Service: Gastroenterology;  Laterality: N/A;   EUS N/A 01/02/2020   Procedure: UPPER ENDOSCOPIC ULTRASOUND (EUS) RADIAL;  Surgeon: Rachael Fee, MD;  Location: [facility] ENDOSCOPY;  Service: Endoscopy;  Laterality: N/A;   EUS N/A 03/06/2023   Procedure: UPPER ENDOSCOPIC ULTRASOUND (EUS) RADIAL;  Surgeon: Lemar Lofty., MD;  Location: [facility] ENDOSCOPY;  Service: Gastroenterology;  Laterality: N/A;   EYE SURGERY Left 1990's   "result of trauma"    FACIAL FRACTURE SURGERY Left 1990's   "result of trauma"    FINE NEEDLE ASPIRATION N/A 03/06/2023   Procedure: FINE NEEDLE ASPIRATION (FNA) LINEAR;  Surgeon: Lemar Lofty., MD;  Location: [facility] ENDOSCOPY;  Service: Gastroenterology;  Laterality: N/A;   FLEXIBLE SIGMOIDOSCOPY N/A 10/25/2020   Procedure: FLEXIBLE SIGMOIDOSCOPY;  Surgeon: Meridee Score Netty Starring., MD;  Location: [facility] ENDOSCOPY;  Service: Gastroenterology;  Laterality: N/A;   FRACTURE SURGERY     HEMOSTASIS CLIP PLACEMENT  10/25/2020   Procedure: HEMOSTASIS CLIP PLACEMENT;  Surgeon:  Lemar Lofty., MD;  Location: [facility] ENDOSCOPY;  Service: Gastroenterology;;   HERNIA REPAIR     HOT HEMOSTASIS N/A 03/06/2023   Procedure: HOT HEMOSTASIS (ARGON PLASMA COAGULATION/BICAP);  Surgeon: Lemar Lofty., MD;  Location: [facility] ENDOSCOPY;  Service: Gastroenterology;  Laterality: N/A;   LEFT HEART CATHETERIZATION WITH CORONARY ANGIOGRAM Right 03/07/2013   Procedure: LEFT HEART CATHETERIZATION WITH CORONARY ANGIOGRAM;  Surgeon: Ricki Rodriguez, MD;  Location: [facility] CATH LAB;  Service: Cardiovascular;  Laterality: Right;   UMBILICAL HERNIA REPAIR     UPPER GASTROINTESTINAL ENDOSCOPY      Social History:  reports that he has been smoking cigarettes and e-cigarettes. He has a 36 pack-year smoking history. He has never used smokeless tobacco. He reports that he does not currently use alcohol after a past usage of about 4.0 standard drinks of alcohol per week. He reports that he does not currently use drugs after having used the following drugs: Marijuana and Cocaine.  Allergies:  Allergies  Allergen Reactions   Robaxin [Methocarbamol] Other (See Comments)    jumpy limbs   Aspirin Other (See Comments)    Unknown reaction   Shellfish-Derived Products Nausea And Vomiting and Rash   Trazodone And Nefazodone Other (See Comments)    Muscle spasms   Adhesive [Tape] Itching   Fish-Derived Products    Latex Itching   Toradol [Ketorolac Tromethamine] Other (See Comments)    Has ulcers; cannot have  this   Contrast Media [Iodinated Contrast Media] Hives   Reglan [Metoclopramide] Other (See Comments)    Muscle spasms   Salmon [Fish Oil] Nausea And Vomiting and Rash   Robaxin [methocarbamol], Aspirin, Shellfish-derived products, Trazodone and nefazodone, Adhesive [tape], Fish-derived products, Latex, Toradol [ketorolac tromethamine], Contrast media [iodinated contrast media], Reglan [metoclopramide], and Salmon [fish oil]   Family history:  Family History  Problem Relation Age of  Onset   Hypertension Mother    Cirrhosis Father    Alcoholism Father    Hypertension Father    Melanoma Father    Hypertension Other    Coronary artery disease Other      Physical Exam: Vitals:   08/10/23 1300 08/10/23 1315 08/10/23 1330 08/10/23 1400  BP: (!) 162/108 (!) 152/104    Pulse:      Resp: 15 18 17  (!) 22  Temp:      TempSrc:      SpO2:      Weight:      Height:       Wt Readings from Last 3 Encounters:  08/10/23 59 kg  07/28/23 59.5 kg  07/01/23 59.5 kg   Body mass index is 19.78 kg/m.  General exam: Pleasant, middle-aged [name] Skin: No rashes, lesions or ulcers. HEENT: Atraumatic, normocephalic, no obvious bleeding Lungs: Clear to auscultation bilaterally,  CVS: S1, S2, no murmur,   GI/Abd: Soft, epigastric manage present, nondistended, bowel sound present,   CNS: Alert, awake, oriented x 3 Psychiatry: Mood appropriate,  Extremities: No pedal edema, no calf tenderness,    ----------------------------------------------------------------------------------------------------------------------------------------- ----------------------------------------------------------------------------------------------------------------------------------------- -----------------------------------------------------------------------------------------------------------------------------------------  Assessment/Plan: Principal Problem:   Intractable diarrhea  Intractable nausea, vomiting, abdominal pain Chronic pancreatitis with pseudocyst Presented with 24 hours of intractable abdominal symptoms, elevated creatinine Has chronic pancreatitis from alcohol.  Reports compliance to Creon. Lipase level normal. CT abdomen was not obtained.  Last CT abdomen from 2 weeks ago was normal. Will start supportive care with IV fluid, IV antiemetics, as needed Imodium.   Clear liquid diet for now Continue Creon Monitor electrolytes  AKI  Acute metabolic  acidosis Elevated creatinine and low bicarb level likely secondary to diarrhea Start bicarb drip. Repeat labs in a.m. Recent Labs    04/09/23 1837 05/26/23 0653 06/08/23 1835 06/14/23 2230 06/18/23 0948 06/19/23 0704 06/20/23 0817 07/01/23 0051 07/28/23 2110 08/10/23 1129  BUN 14 5* 14 11 7 6  5* 7 9 21*  CREATININE 1.02 0.96 0.89 0.91 0.99 0.82 1.02 0.72 0.98 1.87*  CO2 15* 14* 16* 14* 12* 18* 18* 20* 17* 9*   Chronic alcoholism Says he is slowly cutting back on drinking and now drinks 6 pack beer every other day. Start CIWA protocol with as needed Ativan Counseled to stop completely  Leukocytosis No evidence of infection.  Likely reactive to pain.  Continue to monitor Recent Labs  Lab 08/10/23 1129 08/10/23 1605  WBC 14.2*  --   LATICACIDVEN  --  1.6   Sinus tachycardia H/o paroxysmal A-fib H/o WPW syndrome s/p ablation Currently in sinus tachycardia due to dehydration. Continue metoprolol  Hypertension Continue metoprolol.  Expect improvement with pain control  GERD PPI  polysubstance abuse (tobacco, marijuana, alcohol, cocaine) Counseled to quit  bipolar disorder, anxiety/depression, h/o suicidal ideation   Mobility: Encourage ambulation  Goals of care:   Code Status: Full Code    DVT prophylaxis:  enoxaparin (LOVENOX) injection 40 mg Start: 08/10/23 2000   Antimicrobials: None Fluid: Sodium bicarb one half NS at 100 mL/h Consultants: None Family  Communication: None at bedside  Status: Inpatient Level of care:  Telemetry Medical   Patient is from: Home Anticipated d/c to: Home in 2 to 3 days  Diet: Diet Order             Diet clear liquid Fluid consistency: Thin  Diet effective now                    ------------------------------------------------------------------------------------- Severity of Illness: The appropriate patient status for this patient is INPATIENT. Inpatient status is judged to be reasonable and necessary in  order to provide the required intensity of service to ensure the patient's safety. The patient's presenting symptoms, physical exam findings, and initial radiographic and laboratory data in the context of their chronic comorbidities is felt to place them at high risk for further clinical deterioration. Furthermore, it is not anticipated that the patient will be medically stable for discharge from the hospital within 2 midnights of admission.   * I certify that at the point of admission it is my clinical judgment that the patient will require inpatient hospital care spanning beyond 2 midnights from the point of admission due to high intensity of service, high risk for further deterioration and high frequency of surveillance required.* -------------------------------------------------------------------------------------   Home Meds: Prior to Admission medications   Medication Sig Start Date End Date Taking? Authorizing Provider  HYDROcodone-acetaminophen (NORCO/VICODIN) 5-325 MG tablet Take 1 tablet by mouth every 6 (six) hours as needed. 07/14/23  Yes [provider]  omeprazole (PRILOSEC) 40 MG capsule Take 1 capsule by mouth daily. 07/14/23 08/13/23 Yes [provider]  ondansetron (ZOFRAN-ODT) 4 MG disintegrating tablet Take by mouth. 07/14/23  Yes [provider]  oxyCODONE (OXY IR/ROXICODONE) 5 MG immediate release tablet Take by mouth. 08/03/23 08/13/23 Yes [provider]  benzonatate (TESSALON) 100 MG capsule Take 1 capsule (100 mg total) by mouth every 8 (eight) hours. 07/28/23   [name], PA-C  bismuth subsalicylate (KAOPECTATE) 262 MG/15ML suspension Take 15 mLs by mouth every 6 (six) hours as needed for indigestion or diarrhea or loose stools.    [provider]  cyclobenzaprine (FLEXERIL) 10 MG tablet Take 1 tablet (10 mg total) by mouth 2 (two) times daily as needed for muscle spasms. 07/01/23   Tegeler, Canary Brim, MD  dicyclomine (BENTYL) 10  MG capsule Take 1 capsule (10 mg total) by mouth 4 (four) times daily -  before meals and at bedtime for 5 days. 06/20/23 06/25/23  Pokhrel, Rebekah Chesterfield, MD  fluticasone (FLONASE) 50 MCG/ACT nasal spray Place 2 sprays into both nostrils daily as needed for allergies.    [provider]  folic acid (FOLVITE) 1 MG tablet Take 1 tablet (1 mg total) by mouth daily. 09/21/22   Pokhrel, Laxman, MD  lidocaine (LIDODERM) 5 % Place 1 patch onto the skin daily. Remove & Discard patch within 12 hours or as directed by MD 07/01/23   Tegeler, Canary Brim, MD  lidocaine (XYLOCAINE) 2 % solution Use as directed 15 mLs in the mouth or throat every 4 (four) hours as needed for mouth pain. 06/20/23   Pokhrel, Rebekah Chesterfield, MD  lipase/protease/amylase (CREON) 36000 UNITS CPEP capsule Take 2 capsules (72,000 Units total) by mouth 3 (three) times daily with meals. May also take 1 capsule (36,000 Units total) as needed (with snacks). Patient taking differently: Take 2 capsules (72,000 Units total) by mouth for breakfast and dinner , then take 1 capsule for lunch. May also take 1 capsule (36,000 Units  total) as needed (with snacks). 01/26/23   Jerald Kief, MD  liver oil-zinc oxide (DESITIN) 40 % ointment Apply topically 2 (two) times daily. Apply to buttocks Patient taking differently: Apply 1 Application topically as needed for irritation. Apply to buttocks 03/07/23   Rodolph Bong, MD  loperamide (IMODIUM) 2 MG capsule Take 1 capsule (2 mg total) by mouth as needed for diarrhea or loose stools. Patient not taking: Reported on 05/26/2023 03/07/23   Rodolph Bong, MD  magic mouthwash SOLN Take 10 mLs by mouth 4 (four) times daily. 06/10/23   [provider]  magnesium oxide (MAG-OX) 400 (240 Mg) MG tablet Take 400 mg by mouth daily. 04/07/23   [provider]  metoprolol (TOPROL-XL) 200 MG 24 hr tablet Take 1 tablet (200 mg total) by mouth daily. 03/07/23   Rodolph Bong, MD  nicotine (NICODERM CQ  - DOSED IN MG/24 HOURS) 21 mg/24hr patch Place 1 patch (21 mg total) onto the skin daily. 06/20/23   Pokhrel, Rebekah Chesterfield, MD  nitroGLYCERIN (NITROSTAT) 0.4 MG SL tablet Place 0.4 mg under the tongue every 5 (five) minutes as needed for chest pain.    [provider]  ondansetron (ZOFRAN) 4 MG tablet Take 1 tablet (4 mg total) by mouth every 8 (eight) hours as needed for nausea or vomiting. 05/26/23   Dolphus Jenny, PA-C  oxyCODONE-acetaminophen (PERCOCET) 5-325 MG tablet Take 1 tablet by mouth every 6 (six) hours as needed for moderate pain (pain score 4-6) or severe pain (pain score 7-10). 06/20/23   Pokhrel, Rebekah Chesterfield, MD  pantoprazole (PROTONIX) 40 MG tablet Take 1 tablet (40 mg total) by mouth 2 (two) times daily. 05/26/23   Dolphus Jenny, PA-C  pregabalin (LYRICA) 100 MG capsule Take 100 mg by mouth 2 (two) times daily. 02/26/23   [provider]  Simethicone (GAS-X PO) Take 1 capsule by mouth 2 (two) times daily as needed (gas).    [provider]  sucralfate (CARAFATE) 1 GM/10ML suspension Take 10 mLs (1 g total) by mouth 4 (four) times daily -  with meals and at bedtime. 05/26/23   Dolphus Jenny, PA-C  amitriptyline (ELAVIL) 25 MG tablet Take 1 tablet (25 mg total) by mouth at bedtime. Patient not taking: Reported on 08/08/2019 10/15/18 08/08/19  Rolly Salter, MD    Labs on Admission:   CBC: Recent Labs  Lab 08/10/23 1129 08/10/23 1608  WBC 14.2*  --   HGB 11.3* 15.0  HCT 36.7* 44.0  MCV 87.2  --   PLT 216  --     Basic Metabolic Panel: Recent Labs  Lab 08/10/23 1129 08/10/23 1608  NA 132* 134*  K 3.7 3.5  CL 104  --   CO2 9*  --   GLUCOSE 75  --   BUN 21*  --   CREATININE 1.87*  --   CALCIUM 7.6*  --     Liver Function Tests: Recent Labs  Lab 08/10/23 1129  AST 53*  ALT 29  ALKPHOS 108  BILITOT 1.6*  PROT 8.6*  ALBUMIN 4.2   Recent Labs  Lab 08/10/23 1129  LIPASE 21   No results for input(s): "AMMONIA" in the last 168 hours.  Cardiac  Enzymes: No results for input(s): "CKTOTAL", "CKMB", "CKMBINDEX", "TROPONINI" in the last 168 hours.  BNP (last 3 results) No results for input(s): "BNP" in the last 8760 hours.  ProBNP (last 3 results) No results for input(s): "PROBNP" in the last 8760  hours.  CBG: No results for input(s): "GLUCAP" in the last 168 hours.  Lipase     Component Value Date/Time   LIPASE 21 08/10/2023 1129     Urinalysis    Component Value Date/Time   COLORURINE YELLOW 07/28/2023 2110   APPEARANCEUR CLEAR 07/28/2023 2110   LABSPEC 1.020 07/28/2023 2110   PHURINE 6.0 07/28/2023 2110   GLUCOSEU NEGATIVE 07/28/2023 2110   HGBUR NEGATIVE 07/28/2023 2110   HGBUR negative 04/30/2010 1020   BILIRUBINUR NEGATIVE 07/28/2023 2110   BILIRUBINUR negative 09/06/2019 1649   KETONESUR NEGATIVE 07/28/2023 2110   PROTEINUR NEGATIVE 07/28/2023 2110   UROBILINOGEN 0.2 09/06/2019 1649   UROBILINOGEN 0.2 03/15/2015 0508   NITRITE NEGATIVE 07/28/2023 2110   LEUKOCYTESUR NEGATIVE 07/28/2023 2110     Drugs of Abuse     Component Value Date/Time   LABOPIA NONE DETECTED 07/28/2023 2120   COCAINSCRNUR POSITIVE (A) 07/28/2023 2120   COCAINSCRNUR Positive (A) 02/22/2023 0220   COCAINSCRNUR NEGATIVE 05/05/2009 1507   LABBENZ POSITIVE (A) 07/28/2023 2120   LABBENZ NEGATIVE 05/05/2009 1507   AMPHETMU NONE DETECTED 07/28/2023 2120   THCU POSITIVE (A) 07/28/2023 2120   LABBARB NONE DETECTED 07/28/2023 2120      Radiological Exams on Admission: DG Chest 2 View Result Date: 08/10/2023 CLINICAL DATA:  Chest pain. EXAM: CHEST - 2 VIEW COMPARISON:  08/03/2023. FINDINGS: Bilateral lung fields are clear. Bilateral costophrenic angles are clear. Normal cardio-mediastinal silhouette. No acute osseous abnormalities. The soft tissues are within normal limits. IMPRESSION: No active cardiopulmonary disease. Electronically Signed   By: Jules Schick M.D.   On: 08/10/2023 11:41     Signed, Lorin Glass, MD [facility] 08/10/2023

## 2023-08-10 NOTE — Telephone Encounter (Signed)
 Copied from CRM [id number]. Topic: Referral - Question >> Aug 10, 2023  1:13 PM Fonda Kinder J wrote: Reason for CRM: Pt states he needs a new referral to a new pain clinic. He states his current pain clinic only gives him spinal injections and they don't do do pain medication prescriptions

## 2023-08-10 NOTE — ED Notes (Signed)
 Phleb stuck pt twice and vein blew each time. No blood received.

## 2023-08-10 NOTE — ED Provider Notes (Signed)
 [place] [facility] AT [facility] Provider Note   CSN: [id number] Arrival date & time: 08/10/23  7829     History  Chief Complaint  Patient presents with   Chest Pain    [name] is a 53 y.o. male.  53 year old male presents with epigastric pain similar to prior episodes of pancreatitis.  Reports numerous episodes of diarrhea last night nauseated and vomiting this morning.  Unable to tolerate his pain medicines or his metoprolol.  Reports last alcohol use several days ago, does not feel like he is withdrawing.  Notes it has been roughly a week since he last used cocaine.   Chest Pain      Home Medications Prior to Admission medications   Medication Sig Start Date End Date Taking? Authorizing Provider  HYDROcodone-acetaminophen (NORCO/VICODIN) 5-325 MG tablet Take 1 tablet by mouth every 6 (six) hours as needed. 07/14/23  Yes [provider]  omeprazole (PRILOSEC) 40 MG capsule Take 1 capsule by mouth daily. 07/14/23 08/13/23 Yes [provider]  ondansetron (ZOFRAN-ODT) 4 MG disintegrating tablet Take by mouth. 07/14/23  Yes [provider]  oxyCODONE (OXY IR/ROXICODONE) 5 MG immediate release tablet Take by mouth. 08/03/23 08/13/23 Yes [provider]  benzonatate (TESSALON) 100 MG capsule Take 1 capsule (100 mg total) by mouth every 8 (eight) hours. 07/28/23   [name], PA-C  bismuth subsalicylate (KAOPECTATE) 262 MG/15ML suspension Take 15 mLs by mouth every 6 (six) hours as needed for indigestion or diarrhea or loose stools.    [provider]  cyclobenzaprine (FLEXERIL) 10 MG tablet Take 1 tablet (10 mg total) by mouth 2 (two) times daily as needed for muscle spasms. 07/01/23   Tegeler, Canary Brim, MD  dicyclomine (BENTYL) 10 MG capsule Take 1 capsule (10 mg total) by mouth 4 (four) times daily -  before meals and at bedtime for 5 days. 06/20/23 06/25/23  Pokhrel, Rebekah Chesterfield, MD  fluticasone (FLONASE) 50  MCG/ACT nasal spray Place 2 sprays into both nostrils daily as needed for allergies.    [provider]  folic acid (FOLVITE) 1 MG tablet Take 1 tablet (1 mg total) by mouth daily. 09/21/22   Pokhrel, Laxman, MD  lidocaine (LIDODERM) 5 % Place 1 patch onto the skin daily. Remove & Discard patch within 12 hours or as directed by MD 07/01/23   Tegeler, Canary Brim, MD  lidocaine (XYLOCAINE) 2 % solution Use as directed 15 mLs in the mouth or throat every 4 (four) hours as needed for mouth pain. 06/20/23   Pokhrel, Rebekah Chesterfield, MD  lipase/protease/amylase (CREON) 36000 UNITS CPEP capsule Take 2 capsules (72,000 Units total) by mouth 3 (three) times daily with meals. May also take 1 capsule (36,000 Units total) as needed (with snacks). Patient taking differently: Take 2 capsules (72,000 Units total) by mouth for breakfast and dinner , then take 1 capsule for lunch. May also take 1 capsule (36,000 Units total) as needed (with snacks). 01/26/23   Jerald Kief, MD  liver oil-zinc oxide (DESITIN) 40 % ointment Apply topically 2 (two) times daily. Apply to buttocks Patient taking differently: Apply 1 Application topically as needed for irritation. Apply to buttocks 03/07/23   Rodolph Bong, MD  loperamide (IMODIUM) 2 MG capsule Take 1 capsule (2 mg total) by mouth as needed for diarrhea or loose stools. Patient not taking: Reported on 05/26/2023 03/07/23   Rodolph Bong, MD  magic mouthwash SOLN Take 10 mLs by mouth 4 (four) times  daily. 06/10/23   [provider]  magnesium oxide (MAG-OX) 400 (240 Mg) MG tablet Take 400 mg by mouth daily. 04/07/23   [provider]  metoprolol (TOPROL-XL) 200 MG 24 hr tablet Take 1 tablet (200 mg total) by mouth daily. 03/07/23   Rodolph Bong, MD  nicotine (NICODERM CQ - DOSED IN MG/24 HOURS) 21 mg/24hr patch Place 1 patch (21 mg total) onto the skin daily. 06/20/23   Pokhrel, Rebekah Chesterfield, MD  nitroGLYCERIN (NITROSTAT) 0.4 MG SL tablet Place 0.4  mg under the tongue every 5 (five) minutes as needed for chest pain.    [provider]  ondansetron (ZOFRAN) 4 MG tablet Take 1 tablet (4 mg total) by mouth every 8 (eight) hours as needed for nausea or vomiting. 05/26/23   Dolphus Jenny, PA-C  oxyCODONE-acetaminophen (PERCOCET) 5-325 MG tablet Take 1 tablet by mouth every 6 (six) hours as needed for moderate pain (pain score 4-6) or severe pain (pain score 7-10). 06/20/23   Pokhrel, Rebekah Chesterfield, MD  pantoprazole (PROTONIX) 40 MG tablet Take 1 tablet (40 mg total) by mouth 2 (two) times daily. 05/26/23   Dolphus Jenny, PA-C  pregabalin (LYRICA) 100 MG capsule Take 100 mg by mouth 2 (two) times daily. 02/26/23   [provider]  Simethicone (GAS-X PO) Take 1 capsule by mouth 2 (two) times daily as needed (gas).    [provider]  sucralfate (CARAFATE) 1 GM/10ML suspension Take 10 mLs (1 g total) by mouth 4 (four) times daily -  with meals and at bedtime. 05/26/23   Dolphus Jenny, PA-C  amitriptyline (ELAVIL) 25 MG tablet Take 1 tablet (25 mg total) by mouth at bedtime. Patient not taking: Reported on 08/08/2019 10/15/18 08/08/19  Rolly Salter, MD      Allergies    Robaxin [methocarbamol], Aspirin, Shellfish-derived products, Trazodone and nefazodone, Adhesive [tape], Fish-derived products, Latex, Toradol [ketorolac tromethamine], Contrast media [iodinated contrast media], Reglan [metoclopramide], and Salmon [fish oil]    Review of Systems   Review of Systems  Cardiovascular:  Positive for chest pain.    Physical Exam Updated Vital Signs BP (!) 152/104   Pulse (!) 127   Temp 99 F (37.2 C) (Oral)   Resp (!) 22   Ht 5\' 8"  (1.727 m)   Wt 59 kg   SpO2 100%   BMI 19.78 kg/m  Physical Exam Vitals and nursing note reviewed.  Constitutional:      General: He is not in acute distress.    Appearance: He is not toxic-appearing.     Comments: Uncomfortable appearing  Cardiovascular:     Rate and Rhythm: Tachycardia  present. Rhythm irregular.     Heart sounds: Normal heart sounds.  Pulmonary:     Breath sounds: Normal breath sounds.  Abdominal:     General: Abdomen is flat. There is no distension.     Palpations: Abdomen is soft.     Tenderness: There is abdominal tenderness (general). There is no guarding or rebound.  Musculoskeletal:     Right lower leg: No edema.     Left lower leg: No edema.  Skin:    General: Skin is warm.     Capillary Refill: Capillary refill takes less than 2 seconds.  Neurological:     Mental Status: He is alert and oriented to person, place, and time.  Psychiatric:        Mood and Affect: Mood normal.        Behavior: Behavior  normal.     ED Results / Procedures / Treatments   Labs (all labs ordered are listed, but only abnormal results are displayed) Labs Reviewed  CBC - Abnormal; Notable for the following components:      Result Value   WBC 14.2 (*)    RBC 4.21 (*)    Hemoglobin 11.3 (*)    HCT 36.7 (*)    RDW 17.6 (*)    All other components within normal limits  COMPREHENSIVE METABOLIC PANEL WITH GFR - Abnormal; Notable for the following components:   Sodium 132 (*)    CO2 9 (*)    BUN 21 (*)    Creatinine, Ser 1.87 (*)    Calcium 7.6 (*)    Total Protein 8.6 (*)    AST 53 (*)    Total Bilirubin 1.6 (*)    GFR, Estimated 42 (*)    Anion gap 19 (*)    All other components within normal limits  I-STAT VENOUS BLOOD GAS, ED - Abnormal; Notable for the following components:   pH, Ven 7.168 (*)    pCO2, Ven 39.7 (*)    pO2, Ven 78 (*)    Bicarbonate 14.4 (*)    TCO2 16 (*)    Acid-base deficit 14.0 (*)    Sodium 134 (*)    Calcium, Ion 0.96 (*)    All other components within normal limits  LIPASE, BLOOD  URINALYSIS, ROUTINE W REFLEX MICROSCOPIC  RAPID URINE DRUG SCREEN, HOSP PERFORMED  BETA-HYDROXYBUTYRIC ACID  I-STAT CG4 LACTIC ACID, ED  TROPONIN I (HIGH SENSITIVITY)  TROPONIN I (HIGH SENSITIVITY)    EKG EKG  Interpretation Date/Time:  Thursday August 10 2023 09:53:58 EDT Ventricular Rate:  139 PR Interval:  102 QRS Duration:  125 QT Interval:  365 QTC Calculation: 539 R Axis:   77  Text Interpretation: Sinus tachycardia Multiform ventricular premature complexes Nonspecific intraventricular conduction delay Minimal ST depression, inferior leads Confirmed by Estanislado Pandy [phone]) on 08/10/2023 11:46:18 AM  Radiology DG Chest 2 View Result Date: 08/10/2023 CLINICAL DATA:  Chest pain. EXAM: CHEST - 2 VIEW COMPARISON:  08/03/2023. FINDINGS: Bilateral lung fields are clear. Bilateral costophrenic angles are clear. Normal cardio-mediastinal silhouette. No acute osseous abnormalities. The soft tissues are within normal limits. IMPRESSION: No active cardiopulmonary disease. Electronically Signed   By: Jules Schick M.D.   On: 08/10/2023 11:41    Procedures Procedures    Medications Ordered in ED Medications  sodium bicarbonate 75 mEq in sodium chloride 0.45 % 1,075 mL infusion (has no administration in time range)  acetaminophen (TYLENOL) tablet 650 mg (has no administration in time range)    Or  acetaminophen (TYLENOL) suppository 650 mg (has no administration in time range)  polyethylene glycol (MIRALAX / GLYCOLAX) packet 17 g (has no administration in time range)  bisacodyl (DULCOLAX) EC tablet 5 mg (has no administration in time range)  albuterol (PROVENTIL) (2.5 MG/3ML) 0.083% nebulizer solution 2.5 mg (has no administration in time range)  hydrALAZINE (APRESOLINE) injection 10 mg (has no administration in time range)  enoxaparin (LOVENOX) injection 40 mg (has no administration in time range)  sodium chloride 0.9 % bolus 1,000 mL (1,000 mLs Intravenous New Bag/Given 08/10/23 1118)  HYDROmorphone (DILAUDID) injection 1 mg (1 mg Intravenous Given 08/10/23 1117)  ondansetron (ZOFRAN) injection 4 mg (4 mg Intravenous Given 08/10/23 1219)  metoprolol succinate (TOPROL-XL) 24 hr tablet 200 mg (200 mg Oral  Given 08/10/23 1222)  sodium chloride 0.9 % bolus 1,000 mL (1,000  mLs Intravenous New Bag/Given 08/10/23 1511)  HYDROmorphone (DILAUDID) injection 1 mg (1 mg Intravenous Given 08/10/23 1512)  ondansetron (ZOFRAN) injection 4 mg (4 mg Intravenous Given 08/10/23 1512)    ED Course/ Medical Decision Making/ A&P Clinical Course as of 08/10/23 1646  Thu Aug 10, 2023  1145 DG Chest 2 View IMPRESSION: No active cardiopulmonary disease.   Electronically Signed   [TY]  1351 Patient had unhooked himself and went to the bathroom notes he is torn up once since my initial evaluation and has had several episodes of diarrhea.  Heart rate improving, appears to be normal sinus rhythm at a rate in the low 100s. [TY]  1353 CO2(!): 9 With anion gap. Will get vbg and lactic.  [TY]  1644 Given patient's metabolic derangements, AKI with continued pain requiring IV narcotics and ongoing diarrhea with acidosis will admit for further management.  Case discussed with hospitalist.  Will admit. [TY]    Clinical Course User Index [TY] Coral Spikes, DO                                 Medical Decision Making This is a 53 year old male with complex past medical history, polysubstance abuse, alcohol abuse, WPW, bipolar, hypertension, chronic pancreatitis, seizures.  Presented today with epigastric pain.  He is afebrile, he is tachycardic heart rate in the 130s, appears to be atrial fibrillation on the monitor.  EKG similar findings.  No ST segment changes that would indicate ischemia.  He is hemodynamically stable.  He was unable to tolerate his home metoprolol due to nausea and vomiting.  He also had numerous episodes of diarrhea per his report.  Heart rate could be volume related as well.  Also having abdominal pain.  Had a CT scan 07/28/2023 with no significant acute findings.  Will get basic screening labs.  Will treat his pain with IV Dilaudid as he does use Percocets for pain.  Will give IV fluids as I suspect there is a  component of volume loss.  See ED course for further MDM/disposition.  Amount and/or Complexity of Data Reviewed Labs: ordered. Decision-making details documented in ED Course. Radiology: ordered. Decision-making details documented in ED Course.  Risk Prescription drug management. Decision regarding hospitalization.         Final Clinical Impression(s) / ED Diagnoses Final diagnoses:  AKI (acute kidney injury) [facility])    Rx / DC Orders ED Discharge Orders     None         Coral Spikes, DO 08/10/23 1646

## 2023-08-10 NOTE — ED Notes (Signed)
 Results of venous gas falgged ashley e.rn notified at

## 2023-08-11 ENCOUNTER — Other Ambulatory Visit: Payer: Self-pay

## 2023-08-11 DIAGNOSIS — E8721 Acute metabolic acidosis: Secondary | ICD-10-CM

## 2023-08-11 DIAGNOSIS — E875 Hyperkalemia: Secondary | ICD-10-CM | POA: Diagnosis not present

## 2023-08-11 DIAGNOSIS — R197 Diarrhea, unspecified: Secondary | ICD-10-CM | POA: Diagnosis not present

## 2023-08-11 LAB — GLUCOSE, CAPILLARY
Glucose-Capillary: 172 mg/dL — ABNORMAL HIGH (ref 70–99)
Glucose-Capillary: 218 mg/dL — ABNORMAL HIGH (ref 70–99)
Glucose-Capillary: 27 mg/dL — CL (ref 70–99)
Glucose-Capillary: 28 mg/dL — CL (ref 70–99)
Glucose-Capillary: 28 mg/dL — CL (ref 70–99)
Glucose-Capillary: 458 mg/dL — ABNORMAL HIGH (ref 70–99)
Glucose-Capillary: 486 mg/dL — ABNORMAL HIGH (ref 70–99)
Glucose-Capillary: 51 mg/dL — ABNORMAL LOW (ref 70–99)
Glucose-Capillary: 87 mg/dL (ref 70–99)

## 2023-08-11 LAB — CBC
HCT: 32.3 % — ABNORMAL LOW (ref 39.0–52.0)
Hemoglobin: 9.7 g/dL — ABNORMAL LOW (ref 13.0–17.0)
MCH: 26.8 pg (ref 26.0–34.0)
MCHC: 30 g/dL (ref 30.0–36.0)
MCV: 89.2 fL (ref 80.0–100.0)
Platelets: 223 10*3/uL (ref 150–400)
RBC: 3.62 MIL/uL — ABNORMAL LOW (ref 4.22–5.81)
RDW: 17.6 % — ABNORMAL HIGH (ref 11.5–15.5)
WBC: 8.7 10*3/uL (ref 4.0–10.5)
nRBC: 0.2 % (ref 0.0–0.2)

## 2023-08-11 LAB — BASIC METABOLIC PANEL WITH GFR
Anion gap: 6 (ref 5–15)
Anion gap: 7 (ref 5–15)
Anion gap: 10 (ref 5–15)
BUN: 12 mg/dL (ref 6–20)
BUN: 11 mg/dL (ref 6–20)
BUN: 8 mg/dL (ref 6–20)
CO2: 11 mmol/L — ABNORMAL LOW (ref 22–32)
CO2: 11 mmol/L — ABNORMAL LOW (ref 22–32)
CO2: 16 mmol/L — ABNORMAL LOW (ref 22–32)
Calcium: 6.5 mg/dL — ABNORMAL LOW (ref 8.9–10.3)
Calcium: 6.6 mg/dL — ABNORMAL LOW (ref 8.9–10.3)
Calcium: 6.1 mg/dL — CL (ref 8.9–10.3)
Chloride: 115 mmol/L — ABNORMAL HIGH (ref 98–111)
Chloride: 116 mmol/L — ABNORMAL HIGH (ref 98–111)
Chloride: 114 mmol/L — ABNORMAL HIGH (ref 98–111)
Creatinine, Ser: 1 mg/dL (ref 0.61–1.24)
Creatinine, Ser: 0.94 mg/dL (ref 0.61–1.24)
Creatinine, Ser: 0.78 mg/dL (ref 0.61–1.24)
GFR, Estimated: >60 mL/min (ref >60)
GFR, Estimated: >60 mL/min (ref >60)
GFR, Estimated: >60 mL/min (ref >60)
Glucose, Bld: 110 mg/dL — ABNORMAL HIGH (ref 70–99)
Glucose, Bld: 80 mg/dL (ref 70–99)
Glucose, Bld: 87 mg/dL (ref 70–99)
Potassium: >7.5 mmol/L (ref 3.5–5.1)
Potassium: 6.4 mmol/L (ref 3.5–5.1)
Potassium: 3.9 mmol/L (ref 3.5–5.1)
Sodium: 132 mmol/L — ABNORMAL LOW (ref 135–145)
Sodium: 134 mmol/L — ABNORMAL LOW (ref 135–145)
Sodium: 140 mmol/L (ref 135–145)

## 2023-08-11 LAB — BLOOD GAS, ARTERIAL
Acid-base deficit: 11.3 mmol/L — ABNORMAL HIGH (ref 0.0–2.0)
Bicarbonate: 12.7 mmol/L — ABNORMAL LOW (ref 20.0–28.0)
Drawn by: 27022
O2 Saturation: 96.8 %
Patient temperature: 37
pCO2 arterial: 24 mmHg — ABNORMAL LOW (ref 32–48)
pH, Arterial: 7.33 — ABNORMAL LOW (ref 7.35–7.45)
pO2, Arterial: 72 mmHg — ABNORMAL LOW (ref 83–108)

## 2023-08-11 LAB — URINALYSIS, ROUTINE W REFLEX MICROSCOPIC
Bilirubin Urine: NEGATIVE
Glucose, UA: NEGATIVE mg/dL
Hgb urine dipstick: NEGATIVE
Ketones, ur: NEGATIVE mg/dL
Leukocytes,Ua: NEGATIVE
Nitrite: NEGATIVE
Protein, ur: NEGATIVE mg/dL
Specific Gravity, Urine: 1.004 — ABNORMAL LOW (ref 1.005–1.030)
pH: 5 (ref 5.0–8.0)

## 2023-08-11 LAB — PROTIME-INR
INR: 1.2 (ref 0.8–1.2)
Prothrombin Time: 15.2 s (ref 11.4–15.2)

## 2023-08-11 LAB — PHOSPHORUS
Phosphorus: <1 mg/dL — CL (ref 2.5–4.6)
Phosphorus: <1 mg/dL — CL (ref 2.5–4.6)
Phosphorus: 3.5 mg/dL (ref 2.5–4.6)

## 2023-08-11 LAB — MAGNESIUM
Magnesium: 0.8 mg/dL — CL (ref 1.7–2.4)
Magnesium: 0.8 mg/dL — CL (ref 1.7–2.4)
Magnesium: 0.6 mg/dL — CL (ref 1.7–2.4)

## 2023-08-11 LAB — CK: Total CK: 514 U/L — ABNORMAL HIGH (ref 49–397)

## 2023-08-11 LAB — LACTATE DEHYDROGENASE: LDH: 171 U/L (ref 98–192)

## 2023-08-11 LAB — LACTIC ACID, PLASMA: Lactic Acid, Venous: 1 mmol/L (ref 0.5–1.9)

## 2023-08-11 LAB — NA AND K (SODIUM & POTASSIUM), RAND UR
Potassium Urine: 10 mmol/L
Sodium, Ur: 131 mmol/L

## 2023-08-11 LAB — LIPASE, BLOOD: Lipase: 21 U/L (ref 11–51)

## 2023-08-11 LAB — MRSA NEXT GEN BY PCR, NASAL: MRSA by PCR Next Gen: NOT DETECTED

## 2023-08-11 MED ORDER — FUROSEMIDE 10 MG/ML IJ SOLN
40.0000 mg | Freq: Once | INTRAMUSCULAR | Status: AC
  Administered 2023-08-11: 40 mg via INTRAVENOUS
  Filled 2023-08-11: qty 4

## 2023-08-11 MED ORDER — DEXTROSE 50 % IV SOLN
INTRAVENOUS | Status: AC
  Filled 2023-08-11: qty 50

## 2023-08-11 MED ORDER — NICOTINE 21 MG/24HR TD PT24
21.0000 mg | MEDICATED_PATCH | Freq: Every day | TRANSDERMAL | Status: DC
  Administered 2023-08-11 – 2023-08-21 (×11): 21 mg via TRANSDERMAL
  Filled 2023-08-11 (×11): qty 1

## 2023-08-11 MED ORDER — SODIUM CHLORIDE 0.9 % IV BOLUS
1000.0000 mL | Freq: Once | INTRAVENOUS | Status: AC
  Administered 2023-08-11: 1000 mL via INTRAVENOUS

## 2023-08-11 MED ORDER — DEXTROSE 50 % IV SOLN
INTRAVENOUS | Status: AC
  Administered 2023-08-11: 25 g via INTRAVENOUS
  Filled 2023-08-11: qty 50

## 2023-08-11 MED ORDER — SODIUM BICARBONATE 8.4 % IV SOLN
50.0000 meq | Freq: Once | INTRAVENOUS | Status: AC
  Administered 2023-08-11: 50 meq via INTRAVENOUS
  Filled 2023-08-11: qty 50

## 2023-08-11 MED ORDER — DEXTROSE 10 % IV SOLN
INTRAVENOUS | Status: DC
  Filled 2023-08-11: qty 1000

## 2023-08-11 MED ORDER — SODIUM CHLORIDE 0.9 % IV SOLN
30.0000 mmol | Freq: Once | INTRAVENOUS | Status: AC
  Administered 2023-08-11: 30 mmol via INTRAVENOUS
  Filled 2023-08-11: qty 10

## 2023-08-11 MED ORDER — SODIUM CHLORIDE 0.9% FLUSH
10.0000 mL | Freq: Two times a day (BID) | INTRAVENOUS | Status: DC
  Administered 2023-08-11 – 2023-08-17 (×11): 10 mL

## 2023-08-11 MED ORDER — POTASSIUM CHLORIDE CRYS ER 20 MEQ PO TBCR
40.0000 meq | EXTENDED_RELEASE_TABLET | Freq: Once | ORAL | Status: AC
  Administered 2023-08-11: 40 meq via ORAL
  Filled 2023-08-11: qty 2

## 2023-08-11 MED ORDER — ALPRAZOLAM 0.25 MG PO TABS
0.2500 mg | ORAL_TABLET | Freq: Three times a day (TID) | ORAL | Status: DC | PRN
  Administered 2023-08-11 – 2023-08-20 (×13): 0.25 mg via ORAL
  Filled 2023-08-11 (×15): qty 1

## 2023-08-11 MED ORDER — MAGNESIUM SULFATE 2 GM/50ML IV SOLN: 2.0000 g | Freq: Once | INTRAVENOUS | Status: DC

## 2023-08-11 MED ORDER — SODIUM BICARBONATE 8.4 % IV SOLN
INTRAVENOUS | Status: AC
Start: 2023-08-11 — End: 2023-08-12
  Filled 2023-08-11: qty 50

## 2023-08-11 MED ORDER — CALCIUM GLUCONATE-NACL 2-0.675 GM/100ML-% IV SOLN
2.0000 g | Freq: Once | INTRAVENOUS | Status: DC
  Filled 2023-08-11: qty 100

## 2023-08-11 MED ORDER — TAMSULOSIN HCL 0.4 MG PO CAPS
0.4000 mg | ORAL_CAPSULE | Freq: Every day | ORAL | Status: DC
  Administered 2023-08-12 – 2023-08-21 (×10): 0.4 mg via ORAL
  Filled 2023-08-11 (×10): qty 1

## 2023-08-11 MED ORDER — CALCIUM CHLORIDE 10 % IV SOLN
1.0000 g | Freq: Once | INTRAVENOUS | Status: AC
  Administered 2023-08-11: 1 g via INTRAVENOUS
  Filled 2023-08-11: qty 10

## 2023-08-11 MED ORDER — THIAMINE HCL 100 MG/ML IJ SOLN
500.0000 mg | Freq: Once | INTRAVENOUS | Status: AC
  Administered 2023-08-11: 500 mg via INTRAVENOUS
  Filled 2023-08-11: qty 5

## 2023-08-11 MED ORDER — DEXTROSE 50 % IV SOLN
1.0000 | Freq: Once | INTRAVENOUS | Status: AC
  Filled 2023-08-11: qty 50

## 2023-08-11 MED ORDER — ORAL CARE MOUTH RINSE: 15.0000 mL | OROMUCOSAL | Status: DC | PRN

## 2023-08-11 MED ORDER — DEXTROSE 50 % IV SOLN: 25.0000 g | INTRAVENOUS | Status: AC

## 2023-08-11 MED ORDER — CHLORHEXIDINE GLUCONATE CLOTH 2 % EX PADS
6.0000 | MEDICATED_PAD | Freq: Every day | CUTANEOUS | Status: DC
  Administered 2023-08-11 – 2023-08-21 (×9): 6 via TOPICAL

## 2023-08-11 MED ORDER — ALBUTEROL SULFATE (2.5 MG/3ML) 0.083% IN NEBU
10.0000 mg | INHALATION_SOLUTION | Freq: Once | RESPIRATORY_TRACT | Status: AC
  Administered 2023-08-11: 10 mg via RESPIRATORY_TRACT
  Filled 2023-08-11: qty 12

## 2023-08-11 MED ORDER — MAGNESIUM SULFATE 4 GM/100ML IV SOLN
4.0000 g | Freq: Once | INTRAVENOUS | Status: DC
  Filled 2023-08-11: qty 100

## 2023-08-11 MED ORDER — MAGNESIUM SULFATE 4 GM/100ML IV SOLN
4.0000 g | Freq: Once | INTRAVENOUS | Status: AC
  Administered 2023-08-11: 4 g via INTRAVENOUS

## 2023-08-11 MED ORDER — SODIUM ZIRCONIUM CYCLOSILICATE 10 G PO PACK
10.0000 g | PACK | Freq: Three times a day (TID) | ORAL | Status: DC
  Administered 2023-08-11: 10 g via ORAL
  Filled 2023-08-11: qty 1

## 2023-08-11 MED ORDER — INSULIN ASPART 100 UNIT/ML IV SOLN
10.0000 [IU] | Freq: Once | INTRAVENOUS | Status: AC
  Administered 2023-08-11: 10 [IU] via INTRAVENOUS

## 2023-08-11 MED ORDER — SODIUM CHLORIDE 0.9% FLUSH: 10.0000 mL | INTRAVENOUS | Status: DC | PRN

## 2023-08-11 MED ORDER — DEXTROSE 50 % IV SOLN: 25.0000 g | Freq: Once | INTRAVENOUS | Status: AC

## 2023-08-11 NOTE — Progress Notes (Signed)
 Interval PCCM Progress Note:  53 year old male with history of chronic alcoholism, chronic pancreatitis, polysubstance use, admitted yesterday with abdominal pain, nausea, vomiting, diarrhea.  Had temp of 99, tachycardia to 130s in the ED.  He had metabolic acidosis and was started on fluid hydration.  Admitted to hospitalist.  He was started on bicarb drip.  CCM consulted evening of 4/4 with marked metabolic derangements including potassium greater than 7.5, Phos less than 1, mag 0.8.  Was transferred to ICU for closer monitoring.  Received PICC line given difficulty of access -Repeating BMP, mag, Phos given  query about above derangements.  Will decide on replacement when repeat labs have resulted.  I did examine him, he is shivering intermittently.  States that he is cold.  Ongoing abdominal pain to light palpation but no rigidity.  He is alert and oriented.

## 2023-08-11 NOTE — Consult Note (Signed)
 [facility]  INPATIENT CONSULTATION  Reason for Consultation: Hyperkalemia Requesting Provider: Dr. Randol Kern  HPI: [name] is an 53 y.o. male with alcoholism, chronic pancreatitis, ongoing tobacco use, poly substance abuse (u tox in past 2 months with benzos, cocaine, THC, opioids), HTN, A fib, WPW s/p ablation, GERD, bipolar d/o currently admitted with abd pain and diarrhea with course complicated by hyperkalemia for which nephrology is consulted.   History mainly obtained from chart and providers, pt fairly weak and not providing much history, mother bedside provides some.  Pt admitted from home (lives w mom) yesterday with N/V and acute on chronic diarrhea.  Initially HR 30s, BP 140s, T 99, VBG 7.17/40, Na 132, Bicarb 9, BUN 21, Cr 1.87.  He was admitted for pain control, antiemetics and fluids.   Today AM labs were delayed due to difficult stick. Labs showed Na 132, K > 7.5, Bicarb 11, BUN 12, Cr 1, Ca 9.5, phos < 1, mag 0.8.  ABG 7.33 / 24 / 72.  EKG no changes.  CK 514. WBC 8.7 Hb 9.7 (from 11.3 at admit but baseline within last few weeks 9s) Plt 223.  Admit AST 53, ALT 29, alb 4.2.   Earlier in the day found to have urinary retention with on foley placement.  He's had 4L NS boluses and is on bicarb gtt 125/hr.  Has also rec'd lokelma 10g, insulin and dextrose, albuterol.  He has put out > 3.3L of urine now which is very dilute in appearance.  He's also receiving IV supplementation of phos, mag and calcium.   He is currently transferring to ICU with all of his care needs though he remains hemodynamically stable.  He admits to thirst but has no other complaints.   PMH: Past Medical History:  Diagnosis Date   Alcoholism /alcohol abuse    Anemia    Anxiety    Arthritis    knees; arms; elbows (03/26/2015)   Asthma    Bipolar disorder (HCC)    Chronic bronchitis (HCC)    Chronic lower back pain    Chronic pancreatitis (HCC)    Cocaine abuse (HCC)     Depression    Family history of adverse reaction to anesthesia    Femoral condyle fracture (HCC) 03/08/2014   left medial/notes 03/09/2014   GERD (gastroesophageal reflux disease)    H/O hiatal hernia    H/O suicide attempt 10/2012   High cholesterol    History of blood transfusion 10/2012   when I tried to commit suicide   History of stomach ulcers    Hypertension    Marijuana abuse, continuous    Migraine    a few times/year (03/26/2015)   Pancreatitis    Pneumonia 1990's X 3   PTSD (post-traumatic stress disorder)    Seizures (HCC)    Sickle cell trait (HCC)    WPW (Wolff-Parkinson-White syndrome)    [name] 03/06/2013   PSH: Past Surgical History:  Procedure Laterality Date   BIOPSY  11/25/2017   Procedure: BIOPSY;  Surgeon: [name] Modena, MD;  [facility];  Service: Endoscopy;;   BIOPSY  10/14/2018   Procedure: BIOPSY;  Surgeon: [name] Modena, MD;  Location: [facility];  Service: Endoscopy;;   BIOPSY  03/06/2023   Procedure: BIOPSY;  Surgeon: Lemar Lofty., MD;  Location: [facility];  Service: Gastroenterology;;   CARDIAC CATHETERIZATION     CYST ENTEROSTOMY  01/02/2020   Procedure: CYST ASPIRATION;  Surgeon: Rachael Fee, MD;  Location:  [facility] ENDOSCOPY;  Service: Endoscopy;;   ESOPHAGOGASTRODUODENOSCOPY N/A 03/06/2023   Procedure: ESOPHAGOGASTRODUODENOSCOPY (EGD);  Surgeon: Lemar Lofty., MD;  Location: [facility] ENDOSCOPY;  Service: Gastroenterology;  Laterality: N/A;   ESOPHAGOGASTRODUODENOSCOPY (EGD) WITH PROPOFOL N/A 11/25/2017   Procedure: ESOPHAGOGASTRODUODENOSCOPY (EGD) WITH PROPOFOL;  Surgeon: [name] Modena, MD;  Location: [facility] ENDOSCOPY;  Service: Endoscopy;  Laterality: N/A;   ESOPHAGOGASTRODUODENOSCOPY (EGD) WITH PROPOFOL Left 10/14/2018   Procedure: ESOPHAGOGASTRODUODENOSCOPY (EGD) WITH PROPOFOL;  Surgeon: [name] Modena, MD;  Location: [facility] ENDOSCOPY;  Service: Endoscopy;  Laterality: Left;   ESOPHAGOGASTRODUODENOSCOPY (EGD) WITH  PROPOFOL N/A 11/14/2018   Procedure: ESOPHAGOGASTRODUODENOSCOPY (EGD) WITH PROPOFOL;  Surgeon: Carman Ching, MD;  [facility]: [facility];  Service: Gastroenterology;  Laterality: N/A;   ESOPHAGOGASTRODUODENOSCOPY (EGD) WITH PROPOFOL N/A 01/02/2020   Procedure: ESOPHAGOGASTRODUODENOSCOPY (EGD) WITH PROPOFOL;  Surgeon: Rachael Fee, MD;  [facility]: [facility];  Service: Endoscopy;  Laterality: N/A;   ESOPHAGOGASTRODUODENOSCOPY (EGD) WITH PROPOFOL N/A 10/25/2020   Procedure: ESOPHAGOGASTRODUODENOSCOPY (EGD) WITH PROPOFOL;  Surgeon: Meridee Score Netty Starring., MD;  Location: [facility] ENDOSCOPY;  Service: Gastroenterology;  Laterality: N/A;   EUS N/A 01/02/2020   Procedure: UPPER ENDOSCOPIC ULTRASOUND (EUS) RADIAL;  Surgeon: Rachael Fee, MD;  [facility]: [facility];  Service: Endoscopy;  Laterality: N/A;   EUS N/A 03/06/2023   Procedure: UPPER ENDOSCOPIC ULTRASOUND (EUS) RADIAL;  Surgeon: Lemar Lofty., MD;  Location: [facility];  Service: Gastroenterology;  Laterality: N/A;   EYE SURGERY Left 1990's   "result of trauma"    FACIAL FRACTURE SURGERY Left 1990's   "result of trauma"    FINE NEEDLE ASPIRATION N/A 03/06/2023   Procedure: FINE NEEDLE ASPIRATION (FNA) LINEAR;  Surgeon: Lemar Lofty., MD;  Location: [facility]OS[facility];  Service: Gastroenterology;  Laterality: N/A;   FLEXIBLE SIGMOIDOSCOPY N/A 10/25/2020   Procedure: FLEXIBLE SIGMOIDOSCOPY;  Surgeon: Meridee Score Netty Starring., MD;  Location: [facility] ENDOSCOPY;  Service: Gastroenterology;  Laterality: N/A;   FRACTURE SURGERY     HEMOSTASIS CLIP PLACEMENT  10/25/2020   Procedure: HEMOSTASIS CLIP PLACEMENT;  Surgeon: Lemar Lofty., MD;  Location: [facility] ENDOSCOPY;  Service: Gastroenterology;;   HERNIA REPAIR     HOT HEMOSTASIS N/A 03/06/2023   Procedure: HOT HEMOSTASIS (ARGON PLASMA COAGULATION/BICAP);  Surgeon: Lemar Lofty., MD;  Location: [facility] ENDOSCOPY;  Service: Gastroenterology;  Laterality: N/A;   LEFT HEART  CATHETERIZATION WITH CORONARY ANGIOGRAM Right 03/07/2013   Procedure: LEFT HEART CATHETERIZATION WITH CORONARY ANGIOGRAM;  Surgeon: Ricki Rodriguez, MD;  [facility] CATH LAB;  Service: Cardiovascular;  Laterality: Right;   UMBILICAL HERNIA REPAIR     UPPER GASTROINTESTINAL ENDOSCOPY      Past Medical History:  Diagnosis Date   Alcoholism /alcohol abuse    Anemia    Anxiety    Arthritis    knees; arms; elbows (03/26/2015)   Asthma    Bipolar disorder (HCC)    Chronic bronchitis (HCC)    Chronic lower back pain    Chronic pancreatitis (HCC)    Cocaine abuse (HCC)    Depression    Family history of adverse reaction to anesthesia    Femoral condyle fracture (HCC) 03/08/2014   left medial/notes 03/09/2014   GERD (gastroesophageal reflux disease)    H/O hiatal hernia    H/O suicide attempt 10/2012   High cholesterol    History of blood transfusion 10/2012   when I tried to commit suicide   History of stomach ulcers    Hypertension    Marijuana abuse, continuous    Migraine  a few times/year (03/26/2015)   Pancreatitis    Pneumonia 1990's X 3   PTSD (post-traumatic stress disorder)    Seizures (HCC)    Sickle cell trait (HCC)    WPW (Wolff-Parkinson-White syndrome)    [name] 03/06/2013    Medications:  I have reviewed the patient's current medications.  Medications Prior to Admission  Medication Sig Dispense Refill   benzonatate (TESSALON) 100 MG capsule Take 1 capsule (100 mg total) by mouth every 8 (eight) hours. 21 capsule 0   bismuth subsalicylate (KAOPECTATE) 262 MG/15ML suspension Take 15 mLs by mouth every 6 (six) hours as needed for indigestion or diarrhea or loose stools.     cyclobenzaprine (FLEXERIL) 10 MG tablet Take 1 tablet (10 mg total) by mouth 2 (two) times daily as needed for muscle spasms. 20 tablet 0   dicyclomine (BENTYL) 10 MG capsule Take 1 capsule (10 mg total) by mouth 4 (four) times daily -  before meals and at bedtime for 5 days. 20 capsule 0    fluticasone (FLONASE) 50 MCG/ACT nasal spray Place 2 sprays into both nostrils daily as needed for allergies.     folic acid (FOLVITE) 1 MG tablet Take 1 tablet (1 mg total) by mouth daily. 30 tablet 0   HYDROcodone-acetaminophen (NORCO/VICODIN) 5-325 MG tablet Take 1 tablet by mouth every 6 (six) hours as needed.     lidocaine (LIDODERM) 5 % Place 1 patch onto the skin daily. Remove & Discard patch within 12 hours or as directed by MD 15 patch 0   lidocaine (XYLOCAINE) 2 % solution Use as directed 15 mLs in the mouth or throat every 4 (four) hours as needed for mouth pain. 100 mL 0   lipase/protease/amylase (CREON) 36000 UNITS CPEP capsule Take 2 capsules (72,000 Units total) by mouth 3 (three) times daily with meals. May also take 1 capsule (36,000 Units total) as needed (with snacks). (Patient taking differently: Take 2 capsules (72,000 Units total) by mouth for breakfast and dinner , then take 1 capsule for lunch. May also take 1 capsule (36,000 Units total) as needed (with snacks).) 240 capsule 0   liver oil-zinc oxide (DESITIN) 40 % ointment Apply topically 2 (two) times daily. Apply to buttocks (Patient taking differently: Apply 1 Application topically as needed for irritation. Apply to buttocks) 56 g 0   loperamide (IMODIUM) 2 MG capsule Take 1 capsule (2 mg total) by mouth as needed for diarrhea or loose stools. (Patient not taking: Reported on 05/26/2023) 20 capsule 0   magic mouthwash SOLN Take 10 mLs by mouth 4 (four) times daily.     magnesium oxide (MAG-OX) 400 (240 Mg) MG tablet Take 400 mg by mouth daily.     metoprolol (TOPROL-XL) 200 MG 24 hr tablet Take 1 tablet (200 mg total) by mouth daily. 90 tablet 0   nicotine (NICODERM CQ - DOSED IN MG/24 HOURS) 21 mg/24hr patch Place 1 patch (21 mg total) onto the skin daily. 28 patch 0   nitroGLYCERIN (NITROSTAT) 0.4 MG SL tablet Place 0.4 mg under the tongue every 5 (five) minutes as needed for chest pain.     omeprazole (PRILOSEC) 40 MG  capsule Take 1 capsule by mouth daily.     ondansetron (ZOFRAN) 4 MG tablet Take 1 tablet (4 mg total) by mouth every 8 (eight) hours as needed for nausea or vomiting. 12 tablet 0   ondansetron (ZOFRAN-ODT) 4 MG disintegrating tablet Take by mouth.     oxyCODONE (OXY IR/ROXICODONE) 5 MG  immediate release tablet Take by mouth.     oxyCODONE-acetaminophen (PERCOCET) 5-325 MG tablet Take 1 tablet by mouth every 6 (six) hours as needed for moderate pain (pain score 4-6) or severe pain (pain score 7-10). 20 tablet 0   pantoprazole (PROTONIX) 40 MG tablet Take 1 tablet (40 mg total) by mouth 2 (two) times daily. 60 tablet 2   pregabalin (LYRICA) 100 MG capsule Take 100 mg by mouth 2 (two) times daily.     Simethicone (GAS-X PO) Take 1 capsule by mouth 2 (two) times daily as needed (gas).     sucralfate (CARAFATE) 1 GM/10ML suspension Take 10 mLs (1 g total) by mouth 4 (four) times daily -  with meals and at bedtime. 420 mL 0    ALLERGIES:   Allergies  Allergen Reactions   Robaxin [Methocarbamol] Other (See Comments)    jumpy limbs   Aspirin Other (See Comments)    Unknown reaction   Shellfish-Derived Products Nausea And Vomiting and Rash   Trazodone And Nefazodone Other (See Comments)    Muscle spasms   Adhesive [Tape] Itching   Fish-Derived Products    Latex Itching   Toradol [Ketorolac Tromethamine] Other (See Comments)    Has ulcers; cannot have this   Contrast Media [Iodinated Contrast Media] Hives   Reglan [Metoclopramide] Other (See Comments)    Muscle spasms   Salmon [Fish Oil] Nausea And Vomiting and Rash    FAM HX: Family History  Problem Relation Age of Onset   Hypertension Mother    Cirrhosis Father    Alcoholism Father    Hypertension Father    Melanoma Father    Hypertension Other    Coronary artery disease Other     Social History:   reports that he has been smoking cigarettes and e-cigarettes. He has a 36 pack-year smoking history. He has never used smokeless  tobacco. He reports that he does not currently use alcohol after a past usage of about 4.0 standard drinks of alcohol per week. He reports that he does not currently use drugs after having used the following drugs: Marijuana and Cocaine.  ROS: 12 system ROS neg except per HPI  Blood pressure 114/80, pulse 75, temperature 98.6 F (37 C), temperature source Oral, resp. rate 16, height 5\' 8"  (1.727 m), weight 59 kg, SpO2 98%. PHYSICAL EXAM: Gen: thin man lying in bed with eyes closed  Eyes: EOMI ENT: MM tacky, lips cracked, poor dentition CV:  RRR Abd: soft, mildly TTP Lungs: clear GU: foley in place draining very dilute urine Extr: no edema Neuro: grossly nonfocal but globally weak    Results for orders placed or performed during the hospital encounter of 08/10/23 (from the past 48 hours)  CBC     Status: Abnormal   Collection Time: 08/10/23 11:29 AM  Result Value Ref Range   WBC 14.2 (H) 4.0 - 10.5 K/uL   RBC 4.21 (L) 4.22 - 5.81 MIL/uL   Hemoglobin 11.3 (L) 13.0 - 17.0 g/dL   HCT 09.8 (L) 11.9 - 14.7 %   MCV 87.2 80.0 - 100.0 fL   MCH 26.8 26.0 - 34.0 pg   MCHC 30.8 30.0 - 36.0 g/dL   RDW 82.9 (H) 56.2 - 13.0 %   Platelets 216 150 - 400 K/uL   nRBC 0.0 0.0 - 0.2 %    Comment: Performed at [facility], [address] [place], [place] [address]  Comprehensive metabolic panel     Status: Abnormal  Collection Time: 08/10/23 11:29 AM  Result Value Ref Range   Sodium 132 (L) 135 - 145 mmol/L   Potassium 3.7 3.5 - 5.1 mmol/L   Chloride 104 98 - 111 mmol/L   CO2 9 (L) 22 - 32 mmol/L   Glucose, Bld 75 70 - 99 mg/dL    Comment: Glucose reference range applies only to samples taken after fasting for at least 8 hours.   BUN 21 (H) 6 - 20 mg/dL   Creatinine, Ser 0.98 (H) 0.61 - 1.24 mg/dL   Calcium 7.6 (L) 8.9 - 10.3 mg/dL   Total Protein 8.6 (H) 6.5 - 8.1 g/dL   Albumin 4.2 3.5 - 5.0 g/dL   AST 53 (H) 15 - 41 U/L   ALT 29 0 - 44 U/L   Alkaline Phosphatase 108 38 - 126  U/L   Total Bilirubin 1.6 (H) 0.0 - 1.2 mg/dL   GFR, Estimated 42 (L) >60 mL/min    Comment: (NOTE) Calculated using the CKD-EPI Creatinine Equation (2021)    Anion gap 19 (H) 5 - 15    Comment: Performed at [facility], [address], [place], [place] [address]  Lipase, blood     Status: None   Collection Time: 08/10/23 11:29 AM  Result Value Ref Range   Lipase 21 11 - 51 U/L    Comment: Performed at [facility], [address], [place], [place] [address]  Troponin I (High Sensitivity)     Status: None   Collection Time: 08/10/23 11:29 AM  Result Value Ref Range   Troponin I (High Sensitivity) 12 <18 ng/L    Comment: (NOTE) Elevated high sensitivity troponin I (hsTnI) values and significant  changes across serial measurements may suggest ACS but many other  chronic and acute conditions are known to elevate hsTnI results.  Refer to the "Links" section for chest pain algorithms and additional  guidance. Performed at [facility], [address] [place], [place] [address]   Beta-hydroxybutyric acid     Status: Abnormal   Collection Time: 08/10/23 12:02 PM  Result Value Ref Range   Beta-Hydroxybutyric Acid 0.74 (H) 0.05 - 0.27 mmol/L    Comment: Performed at [facility], [address] [place], [place] [address]  Troponin I (High Sensitivity)     Status: None   Collection Time: 08/10/23  3:29 PM  Result Value Ref Range   Troponin I (High Sensitivity) 14 <18 ng/L    Comment: (NOTE) Elevated high sensitivity troponin I (hsTnI) values and significant  changes across serial measurements may suggest ACS but many other  chronic and acute conditions are known to elevate hsTnI results.  Refer to the "Links" section for chest pain algorithms and additional  guidance. Performed at [facility], [address], [place], [place] [address]   I-Stat CG4 Lactic Acid     Status: None   Collection Time: 08/10/23  4:05 PM  Result Value Ref Range   Lactic  Acid, Venous 1.6 0.5 - 1.9 mmol/L  I-Stat venous blood gas, ([facility], [facility], [facility])     Status: Abnormal   Collection Time: 08/10/23  4:08 PM  Result Value Ref Range   pH, Ven 7.168 (LL) 7.25 - 7.43   pCO2, Ven 39.7 (L) 44 - 60 mmHg   pO2, Ven 78 (H) 32 - 45 mmHg   Bicarbonate 14.4 (L) 20.0 - 28.0 mmol/L   TCO2 16 (L) 22 - 32 mmol/L  O2 Saturation 91 %   Acid-base deficit 14.0 (H) 0.0 - 2.0 mmol/L   Sodium 134 (L) 135 - 145 mmol/L   Potassium 3.5 3.5 - 5.1 mmol/L   Calcium, Ion 0.96 (L) 1.15 - 1.40 mmol/L   HCT 44.0 39.0 - 52.0 %   Hemoglobin 15.0 13.0 - 17.0 g/dL   Sample type VENOUS    Comment NOTIFIED PHYSICIAN   CBC     Status: Abnormal   Collection Time: 08/11/23  5:21 AM  Result Value Ref Range   WBC 8.7 4.0 - 10.5 K/uL   RBC 3.62 (L) 4.22 - 5.81 MIL/uL   Hemoglobin 9.7 (L) 13.0 - 17.0 g/dL   HCT 16.1 (L) 09.6 - 04.5 %   MCV 89.2 80.0 - 100.0 fL   MCH 26.8 26.0 - 34.0 pg   MCHC 30.0 30.0 - 36.0 g/dL   RDW 40.9 (H) 81.1 - 91.4 %   Platelets 223 150 - 400 K/uL   nRBC 0.2 0.0 - 0.2 %    Comment: Performed at [facility], [address] [place], [place] [address]  Basic metabolic panel with GFR     Status: Abnormal   Collection Time: 08/11/23  1:33 PM  Result Value Ref Range   Sodium 132 (L) 135 - 145 mmol/L   Potassium >7.5 (HH) 3.5 - 5.1 mmol/L    Comment: CRITICAL RESULT CALLED TO, READ BACK BY AND VERIFIED WITH MADELINE LUCK RN @1444  04.04.2025 E.AHMED RESULTS VERIFIED BY REPEAT TESTING OK TO RELEASE PER RN RESULTS VERIFIED VIA RECOLLECT CORRECTED ON 04/04 AT 1449: PREVIOUSLY REPORTED AS >7.5 CRITICAL RESULT CALLED TO, READ BACK BY AND VERIFIED WITH MADELINE LUCK RN @1444  04.04.2025 E.AHMED RESULTS VERIFIED BY REPEAT TESTING    Chloride 115 (H) 98 - 111 mmol/L   CO2 11 (L) 22 - 32 mmol/L   Glucose, Bld 110 (H) 70 - 99 mg/dL    Comment: Glucose reference range applies only to samples taken after fasting for at least 8 hours.   BUN 12 6 - 20 mg/dL   Creatinine,  Ser 6.21 0.61 - 1.24 mg/dL   Calcium 6.5 (L) 8.9 - 10.3 mg/dL   GFR, Estimated >30 >86 mL/min    Comment: (NOTE) Calculated using the CKD-EPI Creatinine Equation (2021)    Anion gap 6 5 - 15    Comment: Performed at [facility], [address] [place], [place] [address]  Magnesium     Status: Abnormal   Collection Time: 08/11/23  1:33 PM  Result Value Ref Range   Magnesium 0.8 (LL) 1.7 - 2.4 mg/dL    Comment: CRITICAL RESULT CALLED TO, READ BACK BY AND VERIFIED WITH MADELINE LUCK RN @1444  04.04.2025 E.AHMED RESULTS VERIFIED BY REPEAT TESTING OK TO RELEASE PER RN RESULTS VERIFIED VIA RECOLLECT Performed at [facility], [address] [place], [place] [address] CORRECTED ON 04/04 AT 1449: PREVIOUSLY REPORTED AS 0.8 CRITICAL RESULT CALLED TO, READ BACK BY AND VERIFIED WITH MADELINE LUCK RN @1444  04.04.2025 E.AHMED RESULTS VERIFIED BY REPEAT TESTING   Phosphorus     Status: Abnormal   Collection Time: 08/11/23  1:33 PM  Result Value Ref Range   Phosphorus <1.0 (LL) 2.5 - 4.6 mg/dL    Comment: CRITICAL RESULT CALLED TO, READ BACK BY AND VERIFIED WITH MADELINE LUCK RN @1444  04.04.2025 E.AHMED RESULTS VERIFIED BY REPEAT TESTING OK TO RELEASE PER RN RESULTS VERIFIED VIA RECOLLECT Performed at [facility], [address],  [place], [place] [address] CORRECTED ON 04/04 AT 1449: PREVIOUSLY REPORTED AS <1.0 CRITICAL RESULT CALLED TO, READ BACK BY AND VERIFIED WITH MADELINE LUCK RN @1444  04.04.2025 E.AHMED RESULTS VERIFIED BY REPEAT TESTING   Blood gas, arterial     Status: Abnormal   Collection Time: 08/11/23  3:37 PM  Result Value Ref Range   pH, Arterial 7.33 (L) 7.35 - 7.45   pCO2 arterial 24 (L) 32 - 48 mmHg   pO2, Arterial 72 (L) 83 - 108 mmHg   Bicarbonate 12.7 (L) 20.0 - 28.0 mmol/L   Acid-base deficit 11.3 (H) 0.0 - 2.0 mmol/L   O2 Saturation 96.8 %   Patient temperature 37.0    Collection site RR    Drawn by [id number]    Allens test (pass/fail) PASS PASS     Comment: Performed at [facility], [address], [place], [place] [address]  Glucose, capillary     Status: Abnormal   Collection Time: 08/11/23  3:43 PM  Result Value Ref Range   Glucose-Capillary 51 (L) 70 - 99 mg/dL    Comment: Glucose reference range applies only to samples taken after fasting for at least 8 hours.  Basic metabolic panel     Status: Abnormal   Collection Time: 08/11/23  3:46 PM  Result Value Ref Range   Sodium 134 (L) 135 - 145 mmol/L   Potassium 6.4 (HH) 3.5 - 5.1 mmol/L    Comment: CRITICAL RESULT CALLED TO, READ BACK BY AND VERIFIED WITH DR. Randol Kern @1646  08/11/2023 [email or web address] NEW SAMPLE    Chloride 116 (H) 98 - 111 mmol/L   CO2 11 (L) 22 - 32 mmol/L   Glucose, Bld 80 70 - 99 mg/dL    Comment: Glucose reference range applies only to samples taken after fasting for at least 8 hours.   BUN 11 6 - 20 mg/dL   Creatinine, Ser 2.13 0.61 - 1.24 mg/dL   Calcium 6.6 (L) 8.9 - 10.3 mg/dL   GFR, Estimated >08 >65 mL/min    Comment: (NOTE) Calculated using the CKD-EPI Creatinine Equation (2021)    Anion gap 7 5 - 15    Comment: Performed at [facility], [address], [place], [place] [address]  Magnesium     Status: Abnormal   Collection Time: 08/11/23  3:46 PM  Result Value Ref Range   Magnesium 0.8 (LL) 1.7 - 2.4 mg/dL    Comment: CRITICAL RESULT CALLED TO, READ BACK BY AND VERIFIED WITH DR. Randol Kern @1646  08/11/2023 [email or web address] Performed at [facility], [address], [place], [place] [address]   Phosphorus     Status: Abnormal   Collection Time: 08/11/23  3:46 PM  Result Value Ref Range   Phosphorus <1.0 (LL) 2.5 - 4.6 mg/dL    Comment: CRITICAL RESULT CALLED TO, READ BACK BY AND VERIFIED WITH DR. Randol Kern @1646  08/11/2023 [email or web address].J Performed at [facility], [address] [place], [place] [address]   Lactate dehydrogenase     Status: None   Collection Time: 08/11/23  3:46 PM  Result Value Ref Range   LDH 171 98 - 192 U/L     Comment: Performed at [facility], [address], [place], [place] [address]  Lactic acid, plasma     Status: None   Collection Time: 08/11/23  3:46 PM  Result Value Ref Range   Lactic Acid, Venous 1.0 0.5 - 1.9 mmol/L    Comment: Performed at [facility]  Lab, [address], [place], [place] [address]  Protime-INR     Status: None   Collection Time: 08/11/23  3:46 PM  Result Value Ref Range   Prothrombin Time 15.2 11.4 - 15.2 seconds   INR 1.2 0.8 - 1.2    Comment: (NOTE) INR goal varies based on device and disease states. Performed at [facility], [address], [place], [place] [address]   Lipase, blood     Status: None   Collection Time: 08/11/23  3:46 PM  Result Value Ref Range   Lipase 21 11 - 51 U/L    Comment: Performed at [facility], [address] [place], [place] [address]  CK     Status: Abnormal   Collection Time: 08/11/23  3:46 PM  Result Value Ref Range   Total CK 514 (H) 49 - 397 U/L    Comment: Performed at [facility], [address] [place], [place] [address]  Glucose, capillary     Status: Abnormal   Collection Time: 08/11/23  3:58 PM  Result Value Ref Range   Glucose-Capillary 28 (LL) 70 - 99 mg/dL    Comment: Glucose reference range applies only to samples taken after fasting for at least 8 hours.   Comment 1 Notify RN   Glucose, capillary     Status: Abnormal   Collection Time: 08/11/23  4:04 PM  Result Value Ref Range   Glucose-Capillary 218 (H) 70 - 99 mg/dL    Comment: Glucose reference range applies only to samples taken after fasting for at least 8 hours.  Glucose, capillary     Status: Abnormal   Collection Time: 08/11/23  6:35 PM  Result Value Ref Range   Glucose-Capillary 27 (LL) 70 - 99 mg/dL    Comment: Glucose reference range applies only to samples taken after fasting for at least 8 hours.   Comment 1 Notify RN    Comment 2 Document in Chart   Glucose, capillary     Status: Abnormal   Collection Time:  08/11/23  6:38 PM  Result Value Ref Range   Glucose-Capillary 28 (LL) 70 - 99 mg/dL    Comment: Glucose reference range applies only to samples taken after fasting for at least 8 hours.   Comment 1 Notify RN    Comment 2 Document in Chart   Glucose, capillary     Status: Abnormal   Collection Time: 08/11/23  6:48 PM  Result Value Ref Range   Glucose-Capillary 486 (H) 70 - 99 mg/dL    Comment: Glucose reference range applies only to samples taken after fasting for at least 8 hours.  Glucose, capillary     Status: Abnormal   Collection Time: 08/11/23  6:49 PM  Result Value Ref Range   Glucose-Capillary 458 (H) 70 - 99 mg/dL    Comment: Glucose reference range applies only to samples taken after fasting for at least 8 hours.  Glucose, capillary     Status: Abnormal   Collection Time: 08/11/23  7:31 PM  Result Value Ref Range   Glucose-Capillary 172 (H) 70 - 99 mg/dL    Comment: Glucose reference range applies only to samples taken after fasting for at least 8 hours.    [place] EKG SITE RITE Result Date: 08/11/2023 If Site Rite image not attached, placement could not be confirmed due to current cardiac rhythm.  DG Chest 2 View Result Date: 08/10/2023 CLINICAL DATA:  Chest pain. EXAM: CHEST - 2 VIEW  COMPARISON:  08/03/2023. FINDINGS: Bilateral lung fields are clear. Bilateral costophrenic angles are clear. Normal cardio-mediastinal silhouette. No acute osseous abnormalities. The soft tissues are within normal limits. IMPRESSION: No active cardiopulmonary disease. Electronically Signed   By: Jules Schick M.D.   On: 08/10/2023 11:41    Assessment/PlanPharoah Toddy Moran is an 53 y.o. male with alcoholism, chronic pancreatitis, ongoing tobacco use, poly substance abuse (u tox in past 2 months with benzos, cocaine, THC, opioids), HTN, A fib, WPW s/p ablation, GERD, bipolar d/o currently admitted with abd pain and diarrhea with course complicated by hyperkalemia for which nephrology is consulted.    **Hyperkalemia:  severe but no EKG changes.  In setting of hypovolemia and urinary obstruction.  CK just mildly elevated, no CBC changes to suggest pseudohyperK.   No other signs of poor perfusion, hemolysis or other tissue necrosis as K source. No RAASi.  Given volume is restored and urinary obstruction relieved with tremendous UOP I suspect his potassium will quickly normalize.  K is ordered q4h - agree for now.   If it remains persistently elevated could consider CRRT but doubt that will be necessary.   **NAGMA:  recent serum bicarb levels in the 18-20 range acutely worse now in the setting of acute on chronic diarrhea.  Suspect it's all diarrhea related but could have RTA related to urinary obstruction. Follow.  On bicarb gtt currently.    **Polyuria:  UOP > 3L today - possibly nephrogenic DI s/p obstruction relieved vs iatrogenic in setting of loop diuretic + IVF for kaliuretic effect.  Follow over coming days.  If it's DI it should improve with time, but he may need some support with free water if he can't maintain po intake and become hyperNa.    **Acute on chronic diarrhea: chronic pancreatitis on creon. per primary  **Malnutrition secondary to malabsorption and poor intake: multiple electrolyte abnormalities including profoundly low phos, low ca, low mag.  Being repleted IV currently.  Trend.  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  Will be a chronic issue with ongoing EtOH and chronic pancreatitis.   **Anemia:  mild.    Will follow for now,  please continue serial checks of lytes.  PCCM likely can manage overnight but feel free to reach out if I can assit.   Tyler Pita 08/11/2023, 7:49 PM

## 2023-08-11 NOTE — Progress Notes (Signed)
 PROGRESS NOTE    [name]  ZOX:[id number] DOB: 18-May-1970 DOA: 08/10/2023 PCP: Grayce Sessions, NP    Chief Complaint  Patient presents with   Chest Pain    Brief Narrative:   [name] is a 53 y.o. male with PMH significant for chronic alcoholism, chronic pancreatitis with pancreatic pseudocyst, polysubstance abuse (tobacco, marijuana, cocaine), HTN, paroxysmal A-fib not on anticoagulation, WPW syndrome s/p ablation, GERD, bipolar disorder, anxiety/depression, h/o suicidal ideation.  Most recently hospitalized 10/1 to 10/5 for intractable abdominal pain secondary to chronic pancreatitis.   Patient presented to the ED today from home with complaint of abdominal pain, nausea, vomiting, diarrhea.  He has chronic diarrhea related to pancreatitis. Reports compliance to Creon and other meds. From last time, he had multiple episodes of vomiting and diarrhea.  As it was abdominal cramp, unable to hold food down. No fever, no blood in vomitus or stool. Smokes about 15 cigarettes a day Says he is slowly cutting back on drinking and now drinks 6 pack beer every other day. Last use of cocaine was 3 days ago Lives at home with his mom.  Able to ambulate independently   In the ED, temperature 99, tachycardic to 130s, blood pressure in 140s, breathing on room Labs with WBC count 14.2, hemoglobin 11.3, sodium 132, bicarb 9, BUN/creatinine 21/1.87 VBG with pH 7.17, pCO2 40, bicarb 14 Lipase level normal Patient was given IV hydration, IV Dilaudid, IV Zofran Hospitalist service was consulted for inpatient management   At the time of my evaluation, patient was sitting up in ED gurney.  Curled forward with pain. States he had 2 episodes of vomiting in the ED. History reviewed and detailed as above.    Assessment & Plan:   Principal Problem:   Intractable diarrhea   Intractable nausea, vomiting, abdominal pain Chronic pancreatitis with pseudocyst Presented with  24 hours of intractable abdominal symptoms, elevated creatinine Has chronic pancreatitis from alcohol.  Reports compliance to Creon. Lipase level normal. CT abdomen was not obtained.  Last CT abdomen from 2 weeks ago was normal. He is with abdominal pain and tenderness today, but he has evidence of urinary retention Clear liquid diet for now Continue with Creon   AKI  Urinary retention Acute metabolic acidosis -Repeat labs this morning not sufficient, so repeat labs this afternoon still pending, he is a difficult stick -Continue with IV bicarb for now -Foley catheter inserted, started on Flomax  Chronic alcoholism Says he is slowly cutting back on drinking and now drinks 6 pack beer every other day. Start CIWA protocol with as needed Ativan Counseled to stop completely   Leukocytosis No evidence of infection.  Likely reactive to pain.  Continue to monitor  Sinus tachycardia H/o paroxysmal A-fib H/o WPW syndrome s/p ablation Currently in sinus tachycardia due to dehydration. Continue metoprolol   Hypertension Continue metoprolol.  Expect improvement with pain control   GERD PPI   polysubstance abuse (tobacco, marijuana, alcohol, cocaine) Counseled to quit, report last time he used this Monday   bipolar disorder, anxiety/depression, h/o suicidal ideation    DVT prophylaxis: Subcu heparin Code Status: Full code Family Communication: None at bedside Disposition:   Status is: Inpatient    Consultants:  None  Subjective:  He reports anxiety, epigastric pain, nausea but no vomiting, no further diarrhea  Objective: Vitals:   08/11/23 1200 08/11/23 1310 08/11/23 1415 08/11/23 1420  BP: 128/75  98/72 99/73  Pulse:  75    Resp: 17  (!)  22 (!) 22  Temp:      TempSrc:      SpO2:   100% 98%  Weight:      Height:        Intake/Output Summary (Last 24 hours) at 08/11/2023 1426 Last data filed at 08/11/2023 1416 Gross per 24 hour  Intake 1508 ml  Output 850 ml   Net 658 ml   Filed Weights   08/10/23 0959  Weight: 59 kg    Examination:  Awake Alert, Oriented X 3, No new F.N deficits, Normal affect Symmetrical Chest wall movement, Good air movement bilaterally, CTAB RRR,No Gallops,Rubs or new Murmurs, No Parasternal Heave +ve B.Sounds, Abd Soft, suprapubic tenderness No Cyanosis, Clubbing or edema, No new Rash or bruise      Data Reviewed: I have personally reviewed following labs and imaging studies  CBC: Recent Labs  Lab 08/10/23 1129 08/10/23 1608 08/11/23 0521  WBC 14.2*  --  8.7  HGB 11.3* 15.0 9.7*  HCT 36.7* 44.0 32.3*  MCV 87.2  --  89.2  PLT 216  --  223    Basic Metabolic Panel: Recent Labs  Lab 08/10/23 1129 08/10/23 1608  NA 132* 134*  K 3.7 3.5  CL 104  --   CO2 9*  --   GLUCOSE 75  --   BUN 21*  --   CREATININE 1.87*  --   CALCIUM 7.6*  --     GFR: Estimated Creatinine Clearance: 37.7 mL/min (A) (by C-G formula based on SCr of 1.87 mg/dL (H)).  Liver Function Tests: Recent Labs  Lab 08/10/23 1129  AST 53*  ALT 29  ALKPHOS 108  BILITOT 1.6*  PROT 8.6*  ALBUMIN 4.2    CBG: No results for input(s): "GLUCAP" in the last 168 hours.   No results found for this or any previous visit (from the past 240 hours).       Radiology Studies: DG Chest 2 View Result Date: 08/10/2023 CLINICAL DATA:  Chest pain. EXAM: CHEST - 2 VIEW COMPARISON:  08/03/2023. FINDINGS: Bilateral lung fields are clear. Bilateral costophrenic angles are clear. Normal cardio-mediastinal silhouette. No acute osseous abnormalities. The soft tissues are within normal limits. IMPRESSION: No active cardiopulmonary disease. Electronically Signed   By: Jules Schick M.D.   On: 08/10/2023 11:41        Scheduled Meds:  enoxaparin (LOVENOX) injection  40 mg Subcutaneous Q24H   folic acid  1 mg Oral Daily   lipase/protease/amylase  72,000 Units Oral TID WC   metoprolol  200 mg Oral Daily   multivitamin with minerals  1 tablet  Oral Daily   nicotine  21 mg Transdermal Daily   pantoprazole  40 mg Oral BID   thiamine  100 mg Oral Daily   Or   thiamine  100 mg Intravenous Daily   Continuous Infusions:  sodium bicarbonate 75 mEq in sodium chloride 0.45 % 1,075 mL infusion Stopped (08/11/23 1416)     LOS: 1 day        Huey Bienenstock, MD [facility]   To contact the attending provider between 7A-7P or the covering provider during after hours 7P-7A, please log into the web site [email or web address] and access using universal [place] password for that web site. If you do not have the password, please call the hospital operator.  08/11/2023, 2:26 PM

## 2023-08-11 NOTE — Progress Notes (Signed)
 Patient is a difficult stick, labs came in early afternoon, significant for hyperkalemia, potassium> 7.5, no EKG changes, severe hypomagnesemia and hypophosphatemia, recent labs were confirmed nonhemolyzed, and they were repeated, patient received hyperkalemia protocol, IV fluids with IV Lasix, calcium gluconate, bicarb and Lokelma, magnesium and phosphorus has been repleted, patient with fluctuating mental status, difficult stick, pending PICC line, patient will be transferred to ICU upon discussion with PCCM. Huey Bienenstock MD

## 2023-08-11 NOTE — Progress Notes (Signed)
 Peripherally Inserted Central Catheter Placement  The IV Nurse has discussed with the patient and/or persons authorized to consent for the patient, the purpose of this procedure and the potential benefits and risks involved with this procedure.  The benefits include less needle sticks, lab draws from the catheter, and the patient may be discharged home with the catheter. Risks include, but not limited to, infection, bleeding, blood clot (thrombus formation), and puncture of an artery; nerve damage and irregular heartbeat and possibility to perform a PICC exchange if needed/ordered by physician.  Alternatives to this procedure were also discussed.  Bard Power PICC patient education guide, fact sheet on infection prevention and patient information card has been provided to patient /or left at bedside.    PICC Placement Documentation  PICC Triple Lumen 08/11/23 Right Brachial 37 cm 0 cm (Active)  Indication for Insertion or Continuance of Line Limited venous access - need for IV therapy >5 days (PICC only) 08/11/23 2044  Exposed Catheter (cm) 0 cm 08/11/23 2044  Site Assessment Clean, Dry, Intact 08/11/23 2044  Lumen #1 Status Flushed;Blood return noted;Saline locked 08/11/23 2044  Lumen #2 Status Flushed;Blood return noted;Saline locked 08/11/23 2044  Lumen #3 Status Flushed;Blood return noted;Saline locked 08/11/23 2044  Dressing Type Transparent 08/11/23 2044  Dressing Status Antimicrobial disc/dressing in place 08/11/23 2044  Line Care Connections checked and tightened 08/11/23 2044  Line Adjustment (NICU/IV Team Only) No 08/11/23 2044  Dressing Intervention New dressing 08/11/23 2044  Dressing Change Due 08/18/23 08/11/23 2044       Audrie Gallus 08/11/2023, 8:46 PM

## 2023-08-11 NOTE — Progress Notes (Signed)
 eLink Physician-Brief Progress Note Patient Name: [name] DOB: Aug 29, 1970 MRN: [id number]   Date of Service  08/11/2023  HPI/Events of Note  53 y.o. male with alcoholism, chronic pancreatitis, ongoing tobacco use, poly substance abuse, HTN, A fib, WPW s/p ablation, GERD, bipolar d/o currently admitted with abd pain and diarrhea with course complicated by hyperkalemia and NAGMA.  Transferred to ICU for close hemodynamic monitoring.  Evaluated by nephrology.  Good UOP.    eICU Interventions  Chart reviewed     Intervention Category Evaluation Type: New Patient Evaluation  Henry Russel, P 08/11/2023, 8:29 PM

## 2023-08-11 NOTE — Consult Note (Signed)
 NAME:  [name], MRN:  [id number], DOB:  1970-11-11, LOS: 1 ADMISSION DATE:  08/10/2023, CONSULTATION DATE:  08/11/23 REFERRING MD:  Elgergawy , CHIEF COMPLAINT:  Abnormal labs    History of Present Illness:  53 yo M PMH etoh abuse, chronic pancreatitis w pancreatic pseudocyst, polysub abuse, HTN, pAF not on AC, WPW s/p ablation, bipolar disorder w hx SI, who was admitted to [facility] 4/3 after presenting w intractable diarrhea which has  abated following admission. Tx AKI, metabolic acidosis,urinary retention.   On 4/4 pt had markedly abnormal BMP, with K >7.5 from prior 3.7   PCCM is consulted in this setting   Pertinent  Medical History  Etoh abuse Chronic pancreatitis w pseudocyst Bipolar disorder HTN pAF  WPW sp ablation Significant Hospital Events: Including procedures, antibiotic start and stop dates in addition to other pertinent events   4/3 admit to [facility], foley for urinary retention. Supportive care 4/4 marked change in [place] K> 7.5 Mag 0.8 Phos < 1.  PCCM consulted  Interim History / Subjective:  Rapid response RN and primary physician at bedside   Objective   Blood pressure 92/72, pulse 75, temperature 98.6 F (37 C), temperature source Oral, resp. rate (!) 23, height 5\' 8"  (1.727 m), weight 59 kg, SpO2 95%.        Intake/Output Summary (Last 24 hours) at 08/11/2023 1646 Last data filed at 08/11/2023 1416 Gross per 24 hour  Intake 1508 ml  Output 850 ml  Net 658 ml   Filed Weights   08/10/23 0959  Weight: 59 kg    Examination: General: chronically ill M NAD  HENT: NCAT. White coating on tongue. Pink mm  Lungs: CTAb  Cardiovascular: rrr Abdomen: slightly tender to palpation. Soft  Extremities: no obvious acute joint deformity  Neuro: Awake oriented x3 GU: foley  Resolved Hospital Problem list     Assessment & Plan:   Hyperkalemia -Marked lab changes from prior-- >7.5 from 3.7 -EKG is without changes associated w hyperK which would be unusual  with K > 7.5 -query validity of lab P -STAT repeat BMP. If high, repeat EKG and will need temporizing measures + likely ICU transfer  -would also need to explore possible etiologies   Hypophosphatemia Hypomagnesemia -on labs w above K > 7.5, Phos <1 and Mag 0.8 -again query validity. No recent priors to compare w  P -add onto STAT repeat BMP   AGMA  -again, some skepticism re l, validity atest BMP w (K > 7.5). On this panel, serum bicarb is 11, however subsequent gas pH is 7.33 which seems incongruent considering he has been on bicarb gtt since 4/3 (ie was not started following BMP result & prior to gas) P -cont bicarb gtt while we await repeat metabolic panel. Can augment further based on result   AKI -- improving  Urinary retention P -follow renal indices UOP  -cont foley, flomax  EtOH abuse Chronic alcoholic pancreatitis w pseudocyst P -CIWA, micronutrient support, support care -creon   Diarrhea, improved -would not typically expect severe hyperK on the heels of prolonged diarrhea P -PRN imodium  -might be a little volume down still, rcving bicarb gtt but might benefit from add'l IVF -- will wait on repeat labs  Difficult IV access -might be a candidate for something like a midline or PICC  Dispo: TBD pending repeat labs   Best Practice (right click and "Reselect all SmartList Selections" daily)   Diet/type: Regular consistency (see orders) DVT prophylaxis LMWH Pressure  ulcer(s): pressure ulcer assessment deferred  GI prophylaxis: PPI Lines: N/A Foley:  Yes, and it is still needed Code Status:  full code Last date of multidisciplinary goals of care discussion [4/4]  Labs   CBC: Recent Labs  Lab 08/10/23 1129 08/10/23 1608 08/11/23 0521  WBC 14.2*  --  8.7  HGB 11.3* 15.0 9.7*  HCT 36.7* 44.0 32.3*  MCV 87.2  --  89.2  PLT 216  --  223    Basic Metabolic Panel: Recent Labs  Lab 08/10/23 1129 08/10/23 1608 08/11/23 1333  NA 132* 134* 132*  K  3.7 3.5 >7.5*  CL 104  --  115*  CO2 9*  --  11*  GLUCOSE 75  --  110*  BUN 21*  --  12  CREATININE 1.87*  --  1.00  CALCIUM 7.6*  --  6.5*  MG  --   --  0.8*  PHOS  --   --  <1.0*   GFR: Estimated Creatinine Clearance: 70.5 mL/min (by C-G formula based on SCr of 1 mg/dL). Recent Labs  Lab 08/10/23 1129 08/10/23 1605 08/11/23 0521 08/11/23 1546  WBC 14.2*  --  8.7  --   LATICACIDVEN  --  1.6  --  1.0    Liver Function Tests: Recent Labs  Lab 08/10/23 1129  AST 53*  ALT 29  ALKPHOS 108  BILITOT 1.6*  PROT 8.6*  ALBUMIN 4.2   Recent Labs  Lab 08/10/23 1129  LIPASE 21   No results for input(s): "AMMONIA" in the last 168 hours.  ABG    Component Value Date/Time   PHART 7.33 (L) 08/11/2023 1537   PCO2ART 24 (L) 08/11/2023 1537   PO2ART 72 (L) 08/11/2023 1537   HCO3 12.7 (L) 08/11/2023 1537   TCO2 16 (L) 08/10/2023 1608   ACIDBASEDEF 11.3 (H) 08/11/2023 1537   O2SAT 96.8 08/11/2023 1537     Coagulation Profile: Recent Labs  Lab 08/11/23 1546  INR 1.2    Cardiac Enzymes: No results for input(s): "CKTOTAL", "CKMB", "CKMBINDEX", "TROPONINI" in the last 168 hours.  HbA1C: Hgb A1c MFr Bld  Date/Time Value Ref Range Status  11/13/2018 06:34 AM 4.6 (L) 4.8 - 5.6 % Final    Comment:    (NOTE) Pre diabetes:          5.7%-6.4% Diabetes:              >6.4% Glycemic control for   <7.0% adults with diabetes   07/25/2016 06:07 AM 4.6 (L) 4.8 - 5.6 % Final    Comment:    (NOTE)         Pre-diabetes: 5.7 - 6.4         Diabetes: >6.4         Glycemic control for adults with diabetes: <7.0     CBG: Recent Labs  Lab 08/11/23 1543 08/11/23 1558 08/11/23 1604  GLUCAP 51* 28* 218*    Review of Systems:   Review of Systems  Gastrointestinal:  Positive for abdominal pain and diarrhea.  Genitourinary:  Positive for dysuria.     Past Medical History:  He,  has a past medical history of Alcoholism /alcohol abuse, Anemia, Anxiety, Arthritis, Asthma,  Bipolar disorder (HCC), Chronic bronchitis (HCC), Chronic lower back pain, Chronic pancreatitis (HCC), Cocaine abuse (HCC), Depression, Family history of adverse reaction to anesthesia, Femoral condyle fracture (HCC) (03/08/2014), GERD (gastroesophageal reflux disease), H/O hiatal hernia, H/O suicide attempt (10/2012), High cholesterol, History of blood transfusion (10/2012), History of  stomach ulcers, Hypertension, Marijuana abuse, continuous, Migraine, Pancreatitis, Pneumonia (1990's X 3), PTSD (post-traumatic stress disorder), Seizures (HCC), Sickle cell trait (HCC), and WPW (Wolff-Parkinson-White syndrome).   Surgical History:   Past Surgical History:  Procedure Laterality Date   BIOPSY  11/25/2017   Procedure: BIOPSY;  Surgeon: [name] Modena, MD;  Location: [facility] ENDOSCOPY;  Service: Endoscopy;;   BIOPSY  10/14/2018   Procedure: BIOPSY;  Surgeon: [name] Modena, MD;  Location: [facility] ENDOSCOPY;  Service: Endoscopy;;   BIOPSY  03/06/2023   Procedure: BIOPSY;  Surgeon: Lemar Lofty., MD;  Location: [facility] ENDOSCOPY;  Service: Gastroenterology;;   CARDIAC CATHETERIZATION     CYST ENTEROSTOMY  01/02/2020   Procedure: CYST ASPIRATION;  Surgeon: Rachael Fee, MD;  Location: [facility] ENDOSCOPY;  Service: Endoscopy;;   ESOPHAGOGASTRODUODENOSCOPY N/A 03/06/2023   Procedure: ESOPHAGOGASTRODUODENOSCOPY (EGD);  Surgeon: Lemar Lofty., MD;  Location: [facility] ENDOSCOPY;  Service: Gastroenterology;  Laterality: N/A;   ESOPHAGOGASTRODUODENOSCOPY (EGD) WITH PROPOFOL N/A 11/25/2017   Procedure: ESOPHAGOGASTRODUODENOSCOPY (EGD) WITH PROPOFOL;  Surgeon: [name] Modena, MD;  Location: [facility] ENDOSCOPY;  Service: Endoscopy;  Laterality: N/A;   ESOPHAGOGASTRODUODENOSCOPY (EGD) WITH PROPOFOL Left 10/14/2018   Procedure: ESOPHAGOGASTRODUODENOSCOPY (EGD) WITH PROPOFOL;  Surgeon: [name] Modena, MD;  Location: [facility] ENDOSCOPY;  Service: Endoscopy;  Laterality: Left;   ESOPHAGOGASTRODUODENOSCOPY (EGD) WITH PROPOFOL N/A  11/14/2018   Procedure: ESOPHAGOGASTRODUODENOSCOPY (EGD) WITH PROPOFOL;  Surgeon: Carman Ching, MD;  Location: [facility] ENDOSCOPY;  Service: Gastroenterology;  Laterality: N/A;   ESOPHAGOGASTRODUODENOSCOPY (EGD) WITH PROPOFOL N/A 01/02/2020   Procedure: ESOPHAGOGASTRODUODENOSCOPY (EGD) WITH PROPOFOL;  Surgeon: Rachael Fee, MD;  Location: [facility] ENDOSCOPY;  Service: Endoscopy;  Laterality: N/A;   ESOPHAGOGASTRODUODENOSCOPY (EGD) WITH PROPOFOL N/A 10/25/2020   Procedure: ESOPHAGOGASTRODUODENOSCOPY (EGD) WITH PROPOFOL;  Surgeon: Meridee Score Netty Starring., MD;  Location: [facility] ENDOSCOPY;  Service: Gastroenterology;  Laterality: N/A;   EUS N/A 01/02/2020   Procedure: UPPER ENDOSCOPIC ULTRASOUND (EUS) RADIAL;  Surgeon: Rachael Fee, MD;  Location: [facility] ENDOSCOPY;  Service: Endoscopy;  Laterality: N/A;   EUS N/A 03/06/2023   Procedure: UPPER ENDOSCOPIC ULTRASOUND (EUS) RADIAL;  Surgeon: Lemar Lofty., MD;  Location: [facility] ENDOSCOPY;  Service: Gastroenterology;  Laterality: N/A;   EYE SURGERY Left 1990's   "result of trauma"    FACIAL FRACTURE SURGERY Left 1990's   "result of trauma"    FINE NEEDLE ASPIRATION N/A 03/06/2023   Procedure: FINE NEEDLE ASPIRATION (FNA) LINEAR;  Surgeon: Lemar Lofty., MD;  Location: [facility] ENDOSCOPY;  Service: Gastroenterology;  Laterality: N/A;   FLEXIBLE SIGMOIDOSCOPY N/A 10/25/2020   Procedure: FLEXIBLE SIGMOIDOSCOPY;  Surgeon: Meridee Score Netty Starring., MD;  Location: [facility] ENDOSCOPY;  Service: Gastroenterology;  Laterality: N/A;   FRACTURE SURGERY     HEMOSTASIS CLIP PLACEMENT  10/25/2020   Procedure: HEMOSTASIS CLIP PLACEMENT;  Surgeon: Lemar Lofty., MD;  Location: [facility] ENDOSCOPY;  Service: Gastroenterology;;   HERNIA REPAIR     HOT HEMOSTASIS N/A 03/06/2023   Procedure: HOT HEMOSTASIS (ARGON PLASMA COAGULATION/BICAP);  Surgeon: Lemar Lofty., MD;  Location: [facility] ENDOSCOPY;  Service: Gastroenterology;  Laterality: N/A;   LEFT HEART CATHETERIZATION WITH  CORONARY ANGIOGRAM Right 03/07/2013   Procedure: LEFT HEART CATHETERIZATION WITH CORONARY ANGIOGRAM;  Surgeon: Ricki Rodriguez, MD;  Location: [facility] CATH LAB;  Service: Cardiovascular;  Laterality: Right;   UMBILICAL HERNIA REPAIR     UPPER GASTROINTESTINAL ENDOSCOPY       Social History:   reports that he has been smoking cigarettes and e-cigarettes. He has a 36 pack-year smoking history. He has never used  smokeless tobacco. He reports that he does not currently use alcohol after a past usage of about 4.0 standard drinks of alcohol per week. He reports that he does not currently use drugs after having used the following drugs: Marijuana and Cocaine.   Family History:  His family history includes Alcoholism in his father; Cirrhosis in his father; Coronary artery disease in an other family member; Hypertension in his father, mother, and another family member; Melanoma in his father.   Allergies Allergies  Allergen Reactions   Robaxin [Methocarbamol] Other (See Comments)    jumpy limbs   Aspirin Other (See Comments)    Unknown reaction   Shellfish-Derived Products Nausea And Vomiting and Rash   Trazodone And Nefazodone Other (See Comments)    Muscle spasms   Adhesive [Tape] Itching   Fish-Derived Products    Latex Itching   Toradol [Ketorolac Tromethamine] Other (See Comments)    Has ulcers; cannot have this   Contrast Media [Iodinated Contrast Media] Hives   Reglan [Metoclopramide] Other (See Comments)    Muscle spasms   Salmon [Fish Oil] Nausea And Vomiting and Rash     Home Medications  Prior to Admission medications   Medication Sig Start Date End Date Taking? Authorizing Provider  benzonatate (TESSALON) 100 MG capsule Take 1 capsule (100 mg total) by mouth every 8 (eight) hours. 07/28/23   [name], PA-C  bismuth subsalicylate (KAOPECTATE) 262 MG/15ML suspension Take 15 mLs by mouth every 6 (six) hours as needed for indigestion or diarrhea or loose stools.    [provider]  cyclobenzaprine (FLEXERIL) 10 MG tablet Take 1 tablet (10 mg total) by mouth 2 (two) times daily as needed for muscle spasms. 07/01/23   Tegeler, Canary Brim, MD  dicyclomine (BENTYL) 10 MG capsule Take 1 capsule (10 mg total) by mouth 4 (four) times daily -  before meals and at bedtime for 5 days. 06/20/23 06/25/23  Pokhrel, Rebekah Chesterfield, MD  fluticasone (FLONASE) 50 MCG/ACT nasal spray Place 2 sprays into both nostrils daily as needed for allergies.    [provider]  folic acid (FOLVITE) 1 MG tablet Take 1 tablet (1 mg total) by mouth daily. 09/21/22   Pokhrel, Rebekah Chesterfield, MD  HYDROcodone-acetaminophen (NORCO/VICODIN) 5-325 MG tablet Take 1 tablet by mouth every 6 (six) hours as needed. 07/14/23   [provider]  lidocaine (LIDODERM) 5 % Place 1 patch onto the skin daily. Remove & Discard patch within 12 hours or as directed by MD 07/01/23   Tegeler, Canary Brim, MD  lidocaine (XYLOCAINE) 2 % solution Use as directed 15 mLs in the mouth or throat every 4 (four) hours as needed for mouth pain. 06/20/23   Pokhrel, Rebekah Chesterfield, MD  lipase/protease/amylase (CREON) 36000 UNITS CPEP capsule Take 2 capsules (72,000 Units total) by mouth 3 (three) times daily with meals. May also take 1 capsule (36,000 Units total) as needed (with snacks). Patient taking differently: Take 2 capsules (72,000 Units total) by mouth for breakfast and dinner , then take 1 capsule for lunch. May also take 1 capsule (36,000 Units total) as needed (with snacks). 01/26/23   Jerald Kief, MD  liver oil-zinc oxide (DESITIN) 40 % ointment Apply topically 2 (two) times daily. Apply to buttocks Patient taking differently: Apply 1 Application topically as needed for irritation. Apply to buttocks 03/07/23   Rodolph Bong, MD  loperamide (IMODIUM) 2 MG capsule Take 1 capsule (2 mg total) by mouth as needed for diarrhea or loose stools. Patient  not taking: Reported on 05/26/2023 03/07/23   Rodolph Bong, MD  magic  mouthwash SOLN Take 10 mLs by mouth 4 (four) times daily. 06/10/23   [provider]  magnesium oxide (MAG-OX) 400 (240 Mg) MG tablet Take 400 mg by mouth daily. 04/07/23   [provider]  metoprolol (TOPROL-XL) 200 MG 24 hr tablet Take 1 tablet (200 mg total) by mouth daily. 03/07/23   Rodolph Bong, MD  nicotine (NICODERM CQ - DOSED IN MG/24 HOURS) 21 mg/24hr patch Place 1 patch (21 mg total) onto the skin daily. 06/20/23   Pokhrel, Rebekah Chesterfield, MD  nitroGLYCERIN (NITROSTAT) 0.4 MG SL tablet Place 0.4 mg under the tongue every 5 (five) minutes as needed for chest pain.    [provider]  omeprazole (PRILOSEC) 40 MG capsule Take 1 capsule by mouth daily. 07/14/23 08/13/23  [provider]  ondansetron (ZOFRAN) 4 MG tablet Take 1 tablet (4 mg total) by mouth every 8 (eight) hours as needed for nausea or vomiting. 05/26/23   Dolphus Jenny, PA-C  ondansetron (ZOFRAN-ODT) 4 MG disintegrating tablet Take by mouth. 07/14/23   [provider]  oxyCODONE (OXY IR/ROXICODONE) 5 MG immediate release tablet Take by mouth. 08/03/23 08/13/23  [provider]  oxyCODONE-acetaminophen (PERCOCET) 5-325 MG tablet Take 1 tablet by mouth every 6 (six) hours as needed for moderate pain (pain score 4-6) or severe pain (pain score 7-10). 06/20/23   Pokhrel, Laxman, MD  pantoprazole (PROTONIX) 40 MG tablet Take 1 tablet (40 mg total) by mouth 2 (two) times daily. 05/26/23   Dolphus Jenny, PA-C  pregabalin (LYRICA) 100 MG capsule Take 100 mg by mouth 2 (two) times daily. 02/26/23   [provider]  Simethicone (GAS-X PO) Take 1 capsule by mouth 2 (two) times daily as needed (gas).    [provider]  sucralfate (CARAFATE) 1 GM/10ML suspension Take 10 mLs (1 g total) by mouth 4 (four) times daily -  with meals and at bedtime. 05/26/23   Dolphus Jenny, PA-C  amitriptyline (ELAVIL) 25 MG tablet Take 1 tablet (25 mg total) by mouth at bedtime. Patient not taking:  Reported on 08/08/2019 10/15/18 08/08/19  Rolly Salter, MD     Critical care time: na     High MDM  Tessie Fass MSN, AGACNP-BC [facility] Pulmonary/Critical Care Medicine Amion for pager 08/11/2023, 4:46 PM

## 2023-08-11 NOTE — Progress Notes (Signed)
 eLink Physician-Brief Progress Note Patient Name: [name] DOB: 08/19/1970 MRN: [id number]   Date of Service  08/11/2023  HPI/Events of Note  Mg 0.6, Ca 6.1  eICU Interventions  replaced     Intervention Category Intermediate Interventions: Electrolyte abnormality - evaluation and management  Henry Russel, P 08/11/2023, 10:15 PM

## 2023-08-11 NOTE — Progress Notes (Signed)
 PICC nurse note.  1711 PICC order noted.  PICC nurse off campus. Dr. Randol Kern alerted by [facility] that order for urgent PICC means within 6 hours, if central access is needed before this time alternate type of central access should be considered. 1935 Attempted to determine number of lumens needed for patient IV needs to be meet.  Spoke with night shift then day shift beside nurse.  They informed PICC nurse that they would have to contact the doctor for this information.  Also informed PICC nurse that patient is being transferred to ICU.  Made aware that PICC is unable to be placed until number of lumens is determined and PICC nurse notified.  Secure chat to the current doctor as well as the ICU doctor. Will follow up once patient has reached ICU.

## 2023-08-12 DIAGNOSIS — R197 Diarrhea, unspecified: Secondary | ICD-10-CM | POA: Diagnosis not present

## 2023-08-12 LAB — BASIC METABOLIC PANEL WITH GFR
Anion gap: 7 (ref 5–15)
Anion gap: 7 (ref 5–15)
BUN: 6 mg/dL (ref 6–20)
BUN: <5 mg/dL — ABNORMAL LOW (ref 6–20)
CO2: 16 mmol/L — ABNORMAL LOW (ref 22–32)
CO2: 18 mmol/L — ABNORMAL LOW (ref 22–32)
Calcium: 8.3 mg/dL — ABNORMAL LOW (ref 8.9–10.3)
Calcium: 6.9 mg/dL — ABNORMAL LOW (ref 8.9–10.3)
Chloride: 115 mmol/L — ABNORMAL HIGH (ref 98–111)
Chloride: 113 mmol/L — ABNORMAL HIGH (ref 98–111)
Creatinine, Ser: 0.72 mg/dL (ref 0.61–1.24)
Creatinine, Ser: 0.91 mg/dL (ref 0.61–1.24)
GFR, Estimated: >60 mL/min (ref >60)
GFR, Estimated: >60 mL/min (ref >60)
Glucose, Bld: 82 mg/dL (ref 70–99)
Glucose, Bld: 126 mg/dL — ABNORMAL HIGH (ref 70–99)
Potassium: 3.9 mmol/L (ref 3.5–5.1)
Potassium: 4.5 mmol/L (ref 3.5–5.1)
Sodium: 138 mmol/L (ref 135–145)
Sodium: 138 mmol/L (ref 135–145)

## 2023-08-12 LAB — RETICULOCYTES
Immature Retic Fract: 35.3 % — ABNORMAL HIGH (ref 2.3–15.9)
RBC.: 2.93 MIL/uL — ABNORMAL LOW (ref 4.22–5.81)
Retic Count, Absolute: 48.9 10*3/uL (ref 19.0–186.0)
Retic Ct Pct: 1.7 % (ref 0.4–3.1)

## 2023-08-12 LAB — FOLATE: Folate: 14.9 ng/mL (ref >5.9)

## 2023-08-12 LAB — CBC
HCT: 26.5 % — ABNORMAL LOW (ref 39.0–52.0)
Hemoglobin: 8 g/dL — ABNORMAL LOW (ref 13.0–17.0)
MCH: 26.7 pg (ref 26.0–34.0)
MCHC: 30.2 g/dL (ref 30.0–36.0)
MCV: 88.3 fL (ref 80.0–100.0)
Platelets: 143 10*3/uL — ABNORMAL LOW (ref 150–400)
RBC: 3 MIL/uL — ABNORMAL LOW (ref 4.22–5.81)
RDW: 17.5 % — ABNORMAL HIGH (ref 11.5–15.5)
WBC: 8.4 10*3/uL (ref 4.0–10.5)
nRBC: 0 % (ref 0.0–0.2)

## 2023-08-12 LAB — PHOSPHORUS
Phosphorus: 3.3 mg/dL (ref 2.5–4.6)
Phosphorus: 3.6 mg/dL (ref 2.5–4.6)

## 2023-08-12 LAB — MAGNESIUM
Magnesium: 2 mg/dL (ref 1.7–2.4)
Magnesium: 1.2 mg/dL — ABNORMAL LOW (ref 1.7–2.4)

## 2023-08-12 LAB — GLUCOSE, CAPILLARY
Glucose-Capillary: 77 mg/dL (ref 70–99)
Glucose-Capillary: 121 mg/dL — ABNORMAL HIGH (ref 70–99)

## 2023-08-12 LAB — IRON AND TIBC
Iron: 16 ug/dL — ABNORMAL LOW (ref 45–182)
Saturation Ratios: 5 % — ABNORMAL LOW (ref 17.9–39.5)
TIBC: 353 ug/dL (ref 250–450)
UIBC: 337 ug/dL

## 2023-08-12 LAB — POTASSIUM: Potassium: 4.3 mmol/L (ref 3.5–5.1)

## 2023-08-12 LAB — VITAMIN B12: Vitamin B-12: 674 pg/mL (ref 180–914)

## 2023-08-12 LAB — FERRITIN: Ferritin: 25 ng/mL (ref 24–336)

## 2023-08-12 LAB — BETA-HYDROXYBUTYRIC ACID: Beta-Hydroxybutyric Acid: 0.3 mmol/L — ABNORMAL HIGH (ref 0.05–0.27)

## 2023-08-12 MED ORDER — CHLORDIAZEPOXIDE HCL 10 MG PO CAPS: 10.0000 mg | ORAL_CAPSULE | Freq: Three times a day (TID) | ORAL | Status: DC

## 2023-08-12 MED ORDER — SODIUM CHLORIDE 0.45 % IV SOLN
INTRAVENOUS | Status: AC
  Filled 2023-08-12 (×2): qty 75

## 2023-08-12 MED ORDER — THIAMINE HCL 100 MG/ML IJ SOLN
500.0000 mg | INTRAVENOUS | Status: DC
  Administered 2023-08-12 – 2023-08-18 (×7): 500 mg via INTRAVENOUS
  Filled 2023-08-12 (×3): qty 5
  Filled 2023-08-12: qty 500
  Filled 2023-08-12 (×4): qty 5

## 2023-08-12 MED ORDER — MAGNESIUM SULFATE 2 GM/50ML IV SOLN
2.0000 g | Freq: Once | INTRAVENOUS | Status: AC
  Administered 2023-08-12: 2 g via INTRAVENOUS
  Filled 2023-08-12: qty 50

## 2023-08-12 MED ORDER — CHLORDIAZEPOXIDE HCL 5 MG PO CAPS
5.0000 mg | ORAL_CAPSULE | Freq: Three times a day (TID) | ORAL | Status: DC
  Administered 2023-08-12 – 2023-08-14 (×7): 5 mg via ORAL
  Filled 2023-08-12 (×7): qty 1

## 2023-08-12 NOTE — Progress Notes (Signed)
 [facility] Progress Note   Subjective:   moved to ICU for labs and electrolyte management last night - improved, no new issues.  No family present this AM  Objective Vitals:   08/12/23 0600 08/12/23 0700 08/12/23 0815 08/12/23 0900  BP: 119/77 107/68 133/82 117/70  Pulse:  69 88 90  Resp: 17 14 16 19   Temp:      TempSrc:      SpO2:  98% 97% 97%  Weight: 56.7 kg     Height:       Physical Exam General: chronically ill appearing but nontoxic Heart:RRR Lungs: clear Abdomen: soft, mildly TTP  Extremities: no edema  Additional Objective Labs: Basic Metabolic Panel: Recent Labs  Lab 08/11/23 1546 08/11/23 2058 08/12/23 0237  NA 134* 140 138  K 6.4* 3.9 3.9  CL 116* 114* 115*  CO2 11* 16* 16*  GLUCOSE 80 87 82  BUN 11 8 6   CREATININE 0.94 0.78 0.72  CALCIUM 6.6* 6.1* 8.3*  PHOS <1.0* 3.5 3.3   Liver Function Tests: Recent Labs  Lab 08/10/23 1129  AST 53*  ALT 29  ALKPHOS 108  BILITOT 1.6*  PROT 8.6*  ALBUMIN 4.2   Recent Labs  Lab 08/10/23 1129 08/11/23 1546  LIPASE 21 21   CBC: Recent Labs  Lab 08/10/23 1129 08/10/23 1608 08/11/23 0521 08/12/23 0237  WBC 14.2*  --  8.7 8.4  HGB 11.3* 15.0 9.7* 8.0*  HCT 36.7* 44.0 32.3* 26.5*  MCV 87.2  --  89.2 88.3  PLT 216  --  223 143*   Blood Culture    Component Value Date/Time   SDES URINE, CLEAN CATCH 11/20/2020 1221   SPECREQUEST NONE 11/20/2020 1221   CULT  11/20/2020 1221    NO GROWTH Performed at [facility] Lab, [address] [place], [place] [address]    REPTSTATUS 11/21/2020 FINAL 11/20/2020 1221    Cardiac Enzymes: Recent Labs  Lab 08/11/23 1546  CKTOTAL 514*   CBG: Recent Labs  Lab 08/11/23 1848 08/11/23 1849 08/11/23 1931 08/11/23 2353 08/12/23 0734  GLUCAP 486* 458* 172* 87 77   Iron Studies: No results for input(s): "IRON", "TIBC", "TRANSFERRIN", "FERRITIN" in the last 72 hours. @lablastinr3 @ Studies/Results: [place] EKG SITE RITE Result Date:  08/11/2023 If Site Rite image not attached, placement could not be confirmed due to current cardiac rhythm.  DG Chest 2 View Result Date: 08/10/2023 CLINICAL DATA:  Chest pain. EXAM: CHEST - 2 VIEW COMPARISON:  08/03/2023. FINDINGS: Bilateral lung fields are clear. Bilateral costophrenic angles are clear. Normal cardio-mediastinal silhouette. No acute osseous abnormalities. The soft tissues are within normal limits. IMPRESSION: No active cardiopulmonary disease. Electronically Signed   By: Jules Schick M.D.   On: 08/10/2023 11:41   Medications:  dextrose Stopped (08/11/23 2144)   thiamine (VITAMIN B1) injection 500 mg (08/12/23 0836)    Chlorhexidine Gluconate Cloth  6 each Topical Daily   enoxaparin (LOVENOX) injection  40 mg Subcutaneous Q24H   folic acid  1 mg Oral Daily   lipase/protease/amylase  72,000 Units Oral TID WC   metoprolol succinate  200 mg Oral Daily   multivitamin with minerals  1 tablet Oral Daily   nicotine  21 mg Transdermal Daily   pantoprazole  40 mg Oral BID   sodium chloride flush  10-40 mL Intracatheter Q12H   tamsulosin  0.4 mg Oral Daily   Assessment/PlanPharoah Saurav Moran is an 53 y.o. male with alcoholism, chronic pancreatitis, ongoing tobacco use, poly  substance abuse (u tox in past 2 months with benzos, cocaine, THC, opioids), HTN, A fib, WPW s/p ablation, GERD, bipolar d/o currently admitted with abd pain and diarrhea with course complicated by hyperkalemia for which nephrology is consulted.    **Hyperkalemia:  severe but no EKG changes.  In setting of hypovolemia and urinary obstruction.  CK just mildly elevated, no CBC changes to suggest pseudohyperK.   No other signs of poor perfusion, hemolysis or other tissue necrosis as K source. No RAASi.  With restoration of volume and relief of obstruction this quickly normalized.      **NAGMA:  recent serum bicarb levels in the 18-20 range acutely worse now in the setting of acute on chronic diarrhea.  Suspect  it's all diarrhea related but could have RTA related to urinary obstruction. Improved s/p bicarb gtt.       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  Follow over coming days.  If it's obstruction related DI it should improve with time, but he may need some support with free water if he can't maintain po intake and becomes hyperNa.     **Acute on chronic diarrhea: chronic pancreatitis on creon. per primary   **Malnutrition secondary to malabsorption and poor intake: multiple electrolyte abnormalities including profoundly low phos, low ca, low mag.  Being repleted IV successfully.   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  Will be a chronic issue with ongoing EtOH and chronic pancreatitis.    **Anemia:  mild.    Estill Bakes MD 08/12/2023, 9:31 AM  [facility] Pager: [phone]

## 2023-08-12 NOTE — Progress Notes (Signed)
  Pt verbally upset at nutrition staff for asking to speak with the nurse before allowing the patient to order a meal tray. Pt was requesting to order his 5th tray before 1000. Pt ripped telephone off the wall. Pt also upset at nutrition staff when they came around to ask for his order. Pt threw telephone at the bed rail while cursing and raised his voice. I went in the room with Lemar Livings RN and set boundaries with the patient. I told the patient that we will not tolerate threatening behavior towards staff and that he would have to cooperate/be patient with [place] while we are trying to do our jobs.

## 2023-08-12 NOTE — Progress Notes (Deleted)
 Pt verbally upset at nutrition staff for asking to speak with the nurse before allowing the patient to order a meal tray. Pt was requesting to order his 5th tray before 1000. Pt ripped telephone off the wall. Pt also upset at nutrition staff when they came around to ask for his order. Pt threw telephone at the bed rail while cursing and raised his voice. I went in the room with Lemar Livings RN and set boundaries with the patient. I told the patient that we will not tolerate threatening behavior towards staff and that he would have to cooperate/be patient with [place] while we are trying to do our jobs.

## 2023-08-12 NOTE — Progress Notes (Signed)
 PROGRESS NOTE    [name]  WGN:[id number] DOB: 1970-11-09 DOA: 08/10/2023 PCP: Grayce Sessions, NP    Chief Complaint  Patient presents with   Chest Pain    Brief Narrative:   [name] is a 53 y.o. male with PMH significant for chronic alcoholism, chronic pancreatitis with pancreatic pseudocyst, polysubstance abuse (tobacco, marijuana, cocaine), HTN, paroxysmal A-fib not on anticoagulation, WPW syndrome s/p ablation, GERD, bipolar disorder, anxiety/depression, h/o suicidal ideation.  Most recently hospitalized 10/1 to 10/5 for intractable abdominal pain secondary to chronic pancreatitis. - Patient workup significant for metabolic acidosis, dehydration, he endorses heavy alcohol use he drinks 6 large beers every day, as well he does endorse recent snorted cocaine on Monday, admitted for further workup, patient transferred to ICU/08/2023 for severe electrolyte derangement in the setting of severe acidosis, hyperkalemia hypophosphatemia and hypomagnesemia.     Assessment & Plan:   Principal Problem:   Intractable diarrhea   Intractable nausea, vomiting, abdominal pain Chronic pancreatitis with pseudocyst Presented with 24 hours of intractable abdominal symptoms, elevated creatinine Has chronic pancreatitis from alcohol.  Reports compliance to Creon. Lipase level normal. CT abdomen pelvis was done 2 weeks ago with no acute findings. Clear liquid diet, will advance to regular today.     AKI  Urinary retention -Continue with IV fluids -Started on Flomax  Severe hyperkalemia - Potassium> 7.5 yesterday, he had no EKG changes, this is in the setting of euvolemia, and urinary obstruction. - He received Lokelma, IV insulin with D50 push, bicarb, calcium gluconate, albuterol, potassium has normalized this morning. - Continue to monitor closely  None anion gap metabolic acidosis - Due to volume depletion, dehydration, acute on chronic diarrhea, as well  likely with underlying starvation ketoacidosis. - Improved with bicarb drip  Alcohol abuse with early withdrawals -Patient reports he drinks up to 6 large beers per day, he is on CIWA protocol, restless and agitated will start on low-dose Librium as well. - Continue with high-dose thiamine and folic acid  Severe hypomagnesemia Severe hypophosphatemia -Repleted, continue to monitor closely as high concern for refeeding syndrome  Leukocytosis No evidence of infection.  Likely reactive to pain.  Continue to monitor  Sinus tachycardia H/o paroxysmal A-fib H/o WPW syndrome s/p ablation Currently in sinus tachycardia due to dehydration. Continue metoprolol   Hypertension Continue metoprolol.  Expect improvement with pain control   GERD PPI  Malnutrition -Will start on supplements.  Anemia -Likely due to alcohol consumption, will check anemia panel   polysubstance abuse (tobacco, marijuana, alcohol, cocaine) Counseled to quit, report last time he used this Monday   bipolar disorder, anxiety/depression, h/o suicidal ideation    DVT prophylaxis: Subcu heparin Code Status: Full code Family Communication: None at bedside, discussed with mother/4 Disposition:   Status is: Inpatient    Consultants:  PCCM Renal  Subjective:  Significant events overnight while monitored in ICU, he is asking for his diet to be advanced  Objective: Vitals:   08/12/23 0900 08/12/23 1000 08/12/23 1055 08/12/23 1100  BP: 117/70 (!) 134/96 (!) 134/96 128/81  Pulse: 90  88   Resp: 19 14  19   Temp:      TempSrc:      SpO2: 97%     Weight:      Height:        Intake/Output Summary (Last 24 hours) at 08/12/2023 1119 Last data filed at 08/12/2023 1057 Gross per 24 hour  Intake 3665.36 ml  Output 5700 ml  Net -2034.64  ml   Filed Weights   08/10/23 0959 08/12/23 0600  Weight: 59 kg 56.7 kg    Examination:  Awake Alert, Oriented X 3, chronically ill-appearing Symmetrical Chest wall  movement, Good air movement bilaterally, CTAB RRR,No Gallops,Rubs or new Murmurs, No Parasternal Heave +ve B.Sounds, Abd Soft, No tenderness, No rebound - guarding or rigidity. No Cyanosis, Clubbing or edema, No new Rash or bruise       Data Reviewed: I have personally reviewed following labs and imaging studies  CBC: Recent Labs  Lab 08/10/23 1129 08/10/23 1608 08/11/23 0521 08/12/23 0237  WBC 14.2*  --  8.7 8.4  HGB 11.3* 15.0 9.7* 8.0*  HCT 36.7* 44.0 32.3* 26.5*  MCV 87.2  --  89.2 88.3  PLT 216  --  223 143*    Basic Metabolic Panel: Recent Labs  Lab 08/10/23 1129 08/10/23 1608 08/11/23 1333 08/11/23 1546 08/11/23 2058 08/12/23 0237 08/12/23 0905  NA 132* 134* 132* 134* 140 138  --   K 3.7 3.5 >7.5* 6.4* 3.9 3.9 4.3  CL 104  --  115* 116* 114* 115*  --   CO2 9*  --  11* 11* 16* 16*  --   GLUCOSE 75  --  110* 80 87 82  --   BUN 21*  --  12 11 8 6   --   CREATININE 1.87*  --  1.00 0.94 0.78 0.72  --   CALCIUM 7.6*  --  6.5* 6.6* 6.1* 8.3*  --   MG  --   --  0.8* 0.8* 0.6* 2.0  --   PHOS  --   --  <1.0* <1.0* 3.5 3.3  --     GFR: Estimated Creatinine Clearance: 84.7 mL/min (by C-G formula based on SCr of 0.72 mg/dL).  Liver Function Tests: Recent Labs  Lab 08/10/23 1129  AST 53*  ALT 29  ALKPHOS 108  BILITOT 1.6*  PROT 8.6*  ALBUMIN 4.2    CBG: Recent Labs  Lab 08/11/23 1848 08/11/23 1849 08/11/23 1931 08/11/23 2353 08/12/23 0734  GLUCAP 486* 458* 172* 87 77     Recent Results (from the past 240 hours)  MRSA Next Gen by PCR, Nasal     Status: None   Collection Time: 08/11/23  8:02 PM   Specimen: Nasal Mucosa; Nasal Swab  Result Value Ref Range Status   MRSA by PCR Next Gen NOT DETECTED NOT DETECTED Final    Comment: (NOTE) The GeneXpert MRSA Assay (FDA approved for NASAL specimens only), is one component of a comprehensive MRSA colonization surveillance program. It is not intended to diagnose MRSA infection nor to guide or monitor  treatment for MRSA infections. Test performance is not FDA approved in patients less than 51 years old. Performed at [facility], [address] [place], [place] [address]          Radiology Studies: [place] EKG SITE RITE Result Date: 08/11/2023 If Site Rite image not attached, placement could not be confirmed due to current cardiac rhythm.       Scheduled Meds:  chlordiazePOXIDE  10 mg Oral TID   Chlorhexidine Gluconate Cloth  6 each Topical Daily   enoxaparin (LOVENOX) injection  40 mg Subcutaneous Q24H   folic acid  1 mg Oral Daily   lipase/protease/amylase  72,000 Units Oral TID WC   metoprolol succinate  200 mg Oral Daily   multivitamin with minerals  1 tablet Oral Daily   nicotine  21 mg Transdermal Daily  pantoprazole  40 mg Oral BID   sodium chloride flush  10-40 mL Intracatheter Q12H   tamsulosin  0.4 mg Oral Daily   Continuous Infusions:  dextrose Stopped (08/11/23 2144)   thiamine (VITAMIN B1) injection Stopped (08/12/23 0906)     LOS: 2 days        Huey Bienenstock, MD [facility]   To contact the attending provider between 7A-7P or the covering provider during after hours 7P-7A, please log into the web site [email or web address] and access using universal [place] password for that web site. If you do not have the password, please call the hospital operator.  08/12/2023, 11:19 AM

## 2023-08-13 ENCOUNTER — Inpatient Hospital Stay (HOSPITAL_COMMUNITY): Payer: MEDICAID

## 2023-08-13 DIAGNOSIS — R197 Diarrhea, unspecified: Secondary | ICD-10-CM | POA: Diagnosis not present

## 2023-08-13 LAB — BASIC METABOLIC PANEL WITH GFR
Anion gap: 6 (ref 5–15)
Anion gap: 8 (ref 5–15)
BUN: 7 mg/dL (ref 6–20)
BUN: 6 mg/dL (ref 6–20)
CO2: 19 mmol/L — ABNORMAL LOW (ref 22–32)
CO2: 23 mmol/L (ref 22–32)
Calcium: 7.1 mg/dL — ABNORMAL LOW (ref 8.9–10.3)
Calcium: 8.2 mg/dL — ABNORMAL LOW (ref 8.9–10.3)
Chloride: 111 mmol/L (ref 98–111)
Chloride: 104 mmol/L (ref 98–111)
Creatinine, Ser: 0.84 mg/dL (ref 0.61–1.24)
Creatinine, Ser: 0.84 mg/dL (ref 0.61–1.24)
GFR, Estimated: >60 mL/min (ref >60)
GFR, Estimated: >60 mL/min (ref >60)
Glucose, Bld: 87 mg/dL (ref 70–99)
Glucose, Bld: 100 mg/dL — ABNORMAL HIGH (ref 70–99)
Potassium: 5.7 mmol/L — ABNORMAL HIGH (ref 3.5–5.1)
Potassium: 5.2 mmol/L — ABNORMAL HIGH (ref 3.5–5.1)
Sodium: 136 mmol/L (ref 135–145)
Sodium: 135 mmol/L (ref 135–145)

## 2023-08-13 LAB — PHOSPHORUS: Phosphorus: 2.3 mg/dL — ABNORMAL LOW (ref 2.5–4.6)

## 2023-08-13 LAB — CBC
HCT: 22.8 % — ABNORMAL LOW (ref 39.0–52.0)
Hemoglobin: 7 g/dL — ABNORMAL LOW (ref 13.0–17.0)
MCH: 27.3 pg (ref 26.0–34.0)
MCHC: 30.7 g/dL (ref 30.0–36.0)
MCV: 89.1 fL (ref 80.0–100.0)
Platelets: 119 10*3/uL — ABNORMAL LOW (ref 150–400)
RBC: 2.56 MIL/uL — ABNORMAL LOW (ref 4.22–5.81)
RDW: 17.9 % — ABNORMAL HIGH (ref 11.5–15.5)
WBC: 7.8 10*3/uL (ref 4.0–10.5)
nRBC: 0 % (ref 0.0–0.2)

## 2023-08-13 LAB — HEPATIC FUNCTION PANEL
ALT: 33 U/L (ref 0–44)
AST: 77 U/L — ABNORMAL HIGH (ref 15–41)
Albumin: 3.4 g/dL — ABNORMAL LOW (ref 3.5–5.0)
Alkaline Phosphatase: 83 U/L (ref 38–126)
Bilirubin, Direct: <0.1 mg/dL (ref 0.0–0.2)
Total Bilirubin: 0.5 mg/dL (ref 0.0–1.2)
Total Protein: 6.9 g/dL (ref 6.5–8.1)

## 2023-08-13 LAB — PREPARE RBC (CROSSMATCH)

## 2023-08-13 LAB — BRAIN NATRIURETIC PEPTIDE: B Natriuretic Peptide: 423.9 pg/mL — ABNORMAL HIGH (ref 0.0–100.0)

## 2023-08-13 LAB — MAGNESIUM: Magnesium: 1.3 mg/dL — ABNORMAL LOW (ref 1.7–2.4)

## 2023-08-13 MED ORDER — CALCIUM GLUCONATE-NACL 1-0.675 GM/50ML-% IV SOLN
1.0000 g | Freq: Once | INTRAVENOUS | Status: AC
  Administered 2023-08-13: 1000 mg via INTRAVENOUS
  Filled 2023-08-13: qty 50

## 2023-08-13 MED ORDER — SODIUM ZIRCONIUM CYCLOSILICATE 10 G PO PACK
10.0000 g | PACK | Freq: Three times a day (TID) | ORAL | Status: AC
  Administered 2023-08-13 (×3): 10 g via ORAL
  Filled 2023-08-13 (×3): qty 1

## 2023-08-13 MED ORDER — SODIUM ZIRCONIUM CYCLOSILICATE 10 G PO PACK
10.0000 g | PACK | Freq: Once | ORAL | Status: AC
  Administered 2023-08-13: 10 g via ORAL
  Filled 2023-08-13: qty 1

## 2023-08-13 MED ORDER — SODIUM CHLORIDE 0.9% IV SOLUTION: Freq: Once | INTRAVENOUS | Status: AC

## 2023-08-13 MED ORDER — SODIUM PHOSPHATES 45 MMOLE/15ML IV SOLN
30.0000 mmol | Freq: Once | INTRAVENOUS | Status: AC
  Administered 2023-08-13: 30 mmol via INTRAVENOUS
  Filled 2023-08-13: qty 10

## 2023-08-13 MED ORDER — SODIUM CHLORIDE 0.9 % IV SOLN
INTRAVENOUS | Status: DC
  Administered 2023-08-14: 1000 mL via INTRAVENOUS

## 2023-08-13 MED ORDER — FAMOTIDINE 20 MG PO TABS
20.0000 mg | ORAL_TABLET | Freq: Once | ORAL | Status: AC
  Administered 2023-08-13: 20 mg via ORAL
  Filled 2023-08-13: qty 1

## 2023-08-13 MED ORDER — MAGNESIUM SULFATE 4 GM/100ML IV SOLN
4.0000 g | Freq: Once | INTRAVENOUS | Status: AC
  Administered 2023-08-13: 4 g via INTRAVENOUS
  Filled 2023-08-13: qty 100

## 2023-08-13 MED ORDER — FUROSEMIDE 10 MG/ML IJ SOLN
40.0000 mg | Freq: Once | INTRAMUSCULAR | Status: AC
  Administered 2023-08-13: 40 mg via INTRAVENOUS
  Filled 2023-08-13: qty 4

## 2023-08-13 NOTE — Plan of Care (Signed)

## 2023-08-13 NOTE — Progress Notes (Signed)
 PROGRESS NOTE    [name]  OZH:[id number] DOB: 10-10-1970 DOA: 08/10/2023 PCP: Grayce Sessions, NP    Chief Complaint  Patient presents with   Chest Pain    Brief Narrative:   [name] is a 53 y.o. male with PMH significant for chronic alcoholism, chronic pancreatitis with pancreatic pseudocyst, polysubstance abuse (tobacco, marijuana, cocaine), HTN, paroxysmal A-fib not on anticoagulation, WPW syndrome s/p ablation, GERD, bipolar disorder, anxiety/depression, h/o suicidal ideation.  Most recently hospitalized 10/1 to 10/5 for intractable abdominal pain secondary to chronic pancreatitis. - Patient workup significant for metabolic acidosis, dehydration, he endorses heavy alcohol use he drinks 6 large beers every day, as well he does endorse recent snorted cocaine on Monday, admitted for further workup, patient transferred to ICU/08/2023 for severe electrolyte derangement in the setting of severe acidosis, hyperkalemia hypophosphatemia and hypomagnesemia.     Assessment & Plan:   Principal Problem:   Intractable diarrhea   Intractable nausea, vomiting, abdominal pain Chronic pancreatitis with pseudocyst Presented with 24 hours of intractable abdominal symptoms, elevated creatinine Has chronic pancreatitis from alcohol.  Reports compliance to Creon. Lipase level normal. CT abdomen pelvis was done 2 weeks ago with no acute findings. Tolerate advanced diet, so we will repeat CT abdomen pelvis today, continue with as needed pain meds    AKI  Urinary retention -Continue with IV fluids -Started on Flomax  Severe hyperkalemia - Potassium> 7.5, this has improved with temporizing measure, no clear etiology - BM this morning to 5.7, received temporizing measure, will give IV Lasix, IV fluids as well and put on scheduled Lokelma  None anion gap metabolic acidosis - Due to volume depletion, dehydration, acute on chronic diarrhea, as well likely with  underlying starvation ketoacidosis. - Improved with bicarb drip, bicarb within normal limit today, will DC bicarb  Alcohol abuse with early withdrawals -Patient reports he drinks up to 6 large beers per day, he is on CIWA protocol, restless and agitated will start on low-dose Librium as well. - Continue with high-dose thiamine and folic acid  Severe hypomagnesemia Severe hypophosphatemia - Again low today, will replete  Leukocytosis No evidence of infection.  Likely reactive to pain.  Continue to monitor, resolved  Sinus tachycardia H/o paroxysmal A-fib H/o WPW syndrome s/p ablation Currently in sinus tachycardia due to dehydration. Continue metoprolol   Hypertension Continue metoprolol.  Expect improvement with pain control   GERD PPI  Malnutrition -Will start on supplements.  Anemia Globin down to 7 today, anemia workup significant for iron deficiency, hemoglobin is 7 this morning, this is most likely in the setting of alcohol abuse as well, will transfuse 1 unit PRBC I have discussed with the patient, he is agreeable to PRBC transfusion   polysubstance abuse (tobacco, marijuana, alcohol, cocaine) Counseled to quit, report last time he used this Monday   bipolar disorder, anxiety/depression, h/o suicidal ideation    DVT prophylaxis: Subcu heparin Code Status: Full code Family Communication: None at bedside Disposition:   Status is: Inpatient    Consultants:  PCCM Renal  Subjective:  Is complaining of abdominal pain  Objective: Vitals:   08/12/23 2041 08/12/23 2054 08/13/23 0811 08/13/23 1229  BP: (!) 136/96  137/89 127/88  Pulse: 87 90 85 77  Resp:   19 17  Temp:   98.1 F (36.7 C) 97.7 F (36.5 C)  TempSrc:   Oral Oral  SpO2:      Weight:      Height:  Intake/Output Summary (Last 24 hours) at 08/13/2023 1358 Last data filed at 08/12/2023 2306 Gross per 24 hour  Intake 387.98 ml  Output 850 ml  Net -462.02 ml   Filed Weights   08/10/23  0959 08/12/23 0600  Weight: 59 kg 56.7 kg    Examination:  Awake Alert, Oriented X 3, frail, chronically ill-appearing Symmetrical Chest wall movement, Good air movement bilaterally, CTAB RRR,No Gallops,Rubs or new Murmurs, No Parasternal Heave +ve B.Sounds, Abd Soft, epigastric tenderness to palpation No Cyanosis, Clubbing or edema, No new Rash or bruise        Data Reviewed: I have personally reviewed following labs and imaging studies  CBC: Recent Labs  Lab 08/10/23 1129 08/10/23 1608 08/11/23 0521 08/12/23 0237 08/13/23 0440  WBC 14.2*  --  8.7 8.4 7.8  HGB 11.3* 15.0 9.7* 8.0* 7.0*  HCT 36.7* 44.0 32.3* 26.5* 22.8*  MCV 87.2  --  89.2 88.3 89.1  PLT 216  --  223 143* 119*    Basic Metabolic Panel: Recent Labs  Lab 08/11/23 1546 08/11/23 2058 08/12/23 0237 08/12/23 0905 08/12/23 1604 08/13/23 0440 08/13/23 1001  NA 134* 140 138  --  138 136 135  K 6.4* 3.9 3.9 4.3 4.5 5.7* 5.2*  CL 116* 114* 115*  --  113* 111 104  CO2 11* 16* 16*  --  18* 19* 23  GLUCOSE 80 87 82  --  126* 87 100*  BUN 11 8 6   --  <5* 7 6  CREATININE 0.94 0.78 0.72  --  0.91 0.84 0.84  CALCIUM 6.6* 6.1* 8.3*  --  6.9* 7.1* 8.2*  MG 0.8* 0.6* 2.0  --  1.2* 1.3*  --   PHOS <1.0* 3.5 3.3  --  3.6 2.3*  --     GFR: Estimated Creatinine Clearance: 80.6 mL/min (by C-G formula based on SCr of 0.84 mg/dL).  Liver Function Tests: Recent Labs  Lab 08/10/23 1129 08/13/23 1001  AST 53* 77*  ALT 29 33  ALKPHOS 108 83  BILITOT 1.6* 0.5  PROT 8.6* 6.9  ALBUMIN 4.2 3.4*    CBG: Recent Labs  Lab 08/11/23 1849 08/11/23 1931 08/11/23 2353 08/12/23 0734 08/12/23 1202  GLUCAP 458* 172* 87 77 121*     Recent Results (from the past 240 hours)  MRSA Next Gen by PCR, Nasal     Status: None   Collection Time: 08/11/23  8:02 PM   Specimen: Nasal Mucosa; Nasal Swab  Result Value Ref Range Status   MRSA by PCR Next Gen NOT DETECTED NOT DETECTED Final    Comment: (NOTE) The GeneXpert  MRSA Assay (FDA approved for NASAL specimens only), is one component of a comprehensive MRSA colonization surveillance program. It is not intended to diagnose MRSA infection nor to guide or monitor treatment for MRSA infections. Test performance is not FDA approved in patients less than 78 years old. Performed at [facility], [address] [place], [place] [address]          Radiology Studies: [place] EKG SITE RITE Result Date: 08/11/2023 If Site Rite image not attached, placement could not be confirmed due to current cardiac rhythm.       Scheduled Meds:  sodium chloride   Intravenous Once   chlordiazePOXIDE  5 mg Oral TID   Chlorhexidine Gluconate Cloth  6 each Topical Daily   folic acid  1 mg Oral Daily   lipase/protease/amylase  72,000 Units Oral TID WC   metoprolol succinate  200 mg Oral Daily   multivitamin with minerals  1 tablet Oral Daily   nicotine  21 mg Transdermal Daily   pantoprazole  40 mg Oral BID   sodium chloride flush  10-40 mL Intracatheter Q12H   sodium zirconium cyclosilicate  10 g Oral TID   tamsulosin  0.4 mg Oral Daily   Continuous Infusions:  sodium chloride     sodium PHOSPHATE IVPB (in mmol) 30 mmol (08/13/23 0849)   thiamine (VITAMIN B1) injection 500 mg (08/13/23 0815)     LOS: 3 days        Huey Bienenstock, MD [facility]   To contact the attending provider between 7A-7P or the covering provider during after hours 7P-7A, please log into the web site [email or web address] and access using universal [place] password for that web site. If you do not have the password, please call the hospital operator.  08/13/2023, 1:58 PM

## 2023-08-13 NOTE — Plan of Care (Signed)
   Problem: Coping: Goal: Level of anxiety will decrease Outcome: Progressing   Problem: Elimination: Goal: Will not experience complications related to bowel motility Outcome: Progressing Goal: Will not experience complications related to urinary retention Outcome: Progressing   Problem: Pain Managment: Goal: General experience of comfort will improve and/or be controlled Outcome: Progressing   Problem: Safety: Goal: Ability to remain free from injury will improve Outcome: Progressing

## 2023-08-14 DIAGNOSIS — R197 Diarrhea, unspecified: Secondary | ICD-10-CM | POA: Diagnosis not present

## 2023-08-14 LAB — BASIC METABOLIC PANEL WITH GFR
Anion gap: 8 (ref 5–15)
Anion gap: 8 (ref 5–15)
BUN: 7 mg/dL (ref 6–20)
BUN: 6 mg/dL (ref 6–20)
CO2: 20 mmol/L — ABNORMAL LOW (ref 22–32)
CO2: 24 mmol/L (ref 22–32)
Calcium: 7.2 mg/dL — ABNORMAL LOW (ref 8.9–10.3)
Calcium: 8.5 mg/dL — ABNORMAL LOW (ref 8.9–10.3)
Chloride: 108 mmol/L (ref 98–111)
Chloride: 103 mmol/L (ref 98–111)
Creatinine, Ser: 0.83 mg/dL (ref 0.61–1.24)
Creatinine, Ser: 0.84 mg/dL (ref 0.61–1.24)
GFR, Estimated: >60 mL/min (ref >60)
GFR, Estimated: >60 mL/min (ref >60)
Glucose, Bld: 86 mg/dL (ref 70–99)
Glucose, Bld: 106 mg/dL — ABNORMAL HIGH (ref 70–99)
Potassium: 5.8 mmol/L — ABNORMAL HIGH (ref 3.5–5.1)
Potassium: 5.9 mmol/L — ABNORMAL HIGH (ref 3.5–5.1)
Sodium: 136 mmol/L (ref 135–145)
Sodium: 135 mmol/L (ref 135–145)

## 2023-08-14 LAB — CBC
HCT: 29.7 % — ABNORMAL LOW (ref 39.0–52.0)
Hemoglobin: 9.6 g/dL — ABNORMAL LOW (ref 13.0–17.0)
MCH: 27.7 pg (ref 26.0–34.0)
MCHC: 32.3 g/dL (ref 30.0–36.0)
MCV: 85.8 fL (ref 80.0–100.0)
Platelets: 117 10*3/uL — ABNORMAL LOW (ref 150–400)
RBC: 3.46 MIL/uL — ABNORMAL LOW (ref 4.22–5.81)
RDW: 17.6 % — ABNORMAL HIGH (ref 11.5–15.5)
WBC: 11.1 10*3/uL — ABNORMAL HIGH (ref 4.0–10.5)
nRBC: 0 % (ref 0.0–0.2)

## 2023-08-14 LAB — HEMOGLOBIN A1C
Hgb A1c MFr Bld: 4.8 % (ref 4.8–5.6)
Mean Plasma Glucose: 91.06 mg/dL

## 2023-08-14 LAB — TYPE AND SCREEN
ABO/RH(D): B POS
Antibody Screen: NEGATIVE
Unit division: 0

## 2023-08-14 LAB — MAGNESIUM
Magnesium: 1 mg/dL — ABNORMAL LOW (ref 1.7–2.4)
Magnesium: 1.6 mg/dL — ABNORMAL LOW (ref 1.7–2.4)

## 2023-08-14 LAB — PHOSPHORUS: Phosphorus: 3.2 mg/dL (ref 2.5–4.6)

## 2023-08-14 LAB — BPAM RBC
Blood Product Expiration Date: 202504302359
ISSUE DATE / TIME: 202504061656
Unit Type and Rh: 7300

## 2023-08-14 LAB — GLUCOSE, CAPILLARY
Glucose-Capillary: 192 mg/dL — ABNORMAL HIGH (ref 70–99)
Glucose-Capillary: 128 mg/dL — ABNORMAL HIGH (ref 70–99)
Glucose-Capillary: 209 mg/dL — ABNORMAL HIGH (ref 70–99)

## 2023-08-14 MED ORDER — DEXTROSE 50 % IV SOLN
1.0000 | Freq: Once | INTRAVENOUS | Status: AC
  Administered 2023-08-14: 50 mL via INTRAVENOUS
  Filled 2023-08-14: qty 50

## 2023-08-14 MED ORDER — SODIUM BICARBONATE 8.4 % IV SOLN
50.0000 meq | Freq: Once | INTRAVENOUS | Status: AC
  Administered 2023-08-14: 50 meq via INTRAVENOUS
  Filled 2023-08-14: qty 50

## 2023-08-14 MED ORDER — PANTOPRAZOLE SODIUM 40 MG IV SOLR
40.0000 mg | Freq: Two times a day (BID) | INTRAVENOUS | Status: DC
  Administered 2023-08-14 – 2023-08-18 (×9): 40 mg via INTRAVENOUS
  Filled 2023-08-14 (×9): qty 10

## 2023-08-14 MED ORDER — MAGNESIUM SULFATE 4 GM/100ML IV SOLN
4.0000 g | Freq: Once | INTRAVENOUS | Status: AC
  Administered 2023-08-14: 4 g via INTRAVENOUS
  Filled 2023-08-14: qty 100

## 2023-08-14 MED ORDER — CALCIUM GLUCONATE-NACL 1-0.675 GM/50ML-% IV SOLN
1.0000 g | Freq: Once | INTRAVENOUS | Status: AC
  Administered 2023-08-14: 1000 mg via INTRAVENOUS
  Filled 2023-08-14: qty 50

## 2023-08-14 MED ORDER — HYDROMORPHONE HCL 1 MG/ML IJ SOLN
1.0000 mg | Freq: Once | INTRAMUSCULAR | Status: AC
  Administered 2023-08-14: 1 mg via INTRAVENOUS
  Filled 2023-08-14: qty 1

## 2023-08-14 MED ORDER — FUROSEMIDE 10 MG/ML IJ SOLN
40.0000 mg | Freq: Once | INTRAMUSCULAR | Status: AC
  Administered 2023-08-14: 40 mg via INTRAVENOUS
  Filled 2023-08-14: qty 4

## 2023-08-14 MED ORDER — CHLORDIAZEPOXIDE HCL 5 MG PO CAPS
5.0000 mg | ORAL_CAPSULE | Freq: Every day | ORAL | Status: DC
Start: 2023-08-16 — End: 2023-08-18

## 2023-08-14 MED ORDER — HYDROMORPHONE HCL 1 MG/ML IJ SOLN
0.5000 mg | INTRAMUSCULAR | Status: DC | PRN
  Administered 2023-08-14 – 2023-08-15 (×5): 0.5 mg via INTRAVENOUS
  Filled 2023-08-14 (×5): qty 0.5

## 2023-08-14 MED ORDER — INSULIN ASPART 100 UNIT/ML IV SOLN
10.0000 [IU] | Freq: Once | INTRAVENOUS | Status: AC
  Administered 2023-08-14: 10 [IU] via INTRAVENOUS

## 2023-08-14 MED ORDER — MAGNESIUM SULFATE 2 GM/50ML IV SOLN
2.0000 g | Freq: Once | INTRAVENOUS | Status: AC
  Administered 2023-08-14: 2 g via INTRAVENOUS
  Filled 2023-08-14: qty 50

## 2023-08-14 MED ORDER — SODIUM ZIRCONIUM CYCLOSILICATE 10 G PO PACK
10.0000 g | PACK | Freq: Once | ORAL | Status: AC
  Administered 2023-08-14: 10 g via ORAL
  Filled 2023-08-14: qty 1

## 2023-08-14 MED ORDER — SODIUM ZIRCONIUM CYCLOSILICATE 10 G PO PACK
10.0000 g | PACK | Freq: Three times a day (TID) | ORAL | Status: AC
  Administered 2023-08-14 (×3): 10 g via ORAL
  Filled 2023-08-14 (×3): qty 1

## 2023-08-14 MED ORDER — INSULIN ASPART 100 UNIT/ML IV SOLN
5.0000 [IU] | Freq: Once | INTRAVENOUS | Status: AC
  Administered 2023-08-14: 5 [IU] via INTRAVENOUS

## 2023-08-14 MED ORDER — SUCRALFATE 1 GM/10ML PO SUSP
1.0000 g | Freq: Three times a day (TID) | ORAL | Status: DC
  Administered 2023-08-14 – 2023-08-21 (×30): 1 g via ORAL
  Filled 2023-08-14 (×30): qty 10

## 2023-08-14 MED ORDER — CHLORDIAZEPOXIDE HCL 5 MG PO CAPS
5.0000 mg | ORAL_CAPSULE | Freq: Two times a day (BID) | ORAL | Status: DC
  Administered 2023-08-14 – 2023-08-15 (×2): 5 mg via ORAL
  Filled 2023-08-14 (×2): qty 1

## 2023-08-14 MED ORDER — ALBUTEROL SULFATE (2.5 MG/3ML) 0.083% IN NEBU
10.0000 mg | INHALATION_SOLUTION | Freq: Once | RESPIRATORY_TRACT | Status: AC
  Administered 2023-08-14: 10 mg via RESPIRATORY_TRACT
  Filled 2023-08-14: qty 12

## 2023-08-14 NOTE — Plan of Care (Signed)

## 2023-08-14 NOTE — Progress Notes (Signed)
 [facility] Progress Note   Subjective:   moved to ICU for labs and electrolyte management last night - improved, no new issues.  No family present this AM  Objective Vitals:   08/14/23 0638 08/14/23 0900 08/14/23 0910 08/14/23 0911  BP:    (!) 157/83  Pulse: 71   83  Resp: 18 13 12 16   Temp:    98.4 F (36.9 C)  TempSrc:    Oral  SpO2: 98%     Weight:      Height:       Physical Exam General: chronically ill appearing but nontoxic Heart:RRR Lungs: clear Abdomen: soft, mildly TTP  Extremities: no edema  Additional Objective Labs: Basic Metabolic Panel: Recent Labs  Lab 08/12/23 1604 08/13/23 0440 08/13/23 1001 08/14/23 0341  NA 138 136 135 136  K 4.5 5.7* 5.2* 5.8*  CL 113* 111 104 108  CO2 18* 19* 23 20*  GLUCOSE 126* 87 100* 86  BUN <5* 7 6 7   CREATININE 0.91 0.84 0.84 0.83  CALCIUM 6.9* 7.1* 8.2* 7.2*  PHOS 3.6 2.3*  --  3.2   Liver Function Tests: Recent Labs  Lab 08/10/23 1129 08/13/23 1001  AST 53* 77*  ALT 29 33  ALKPHOS 108 83  BILITOT 1.6* 0.5  PROT 8.6* 6.9  ALBUMIN 4.2 3.4*   Recent Labs  Lab 08/10/23 1129 08/11/23 1546  LIPASE 21 21   CBC: Recent Labs  Lab 08/10/23 1129 08/10/23 1608 08/11/23 0521 08/12/23 0237 08/13/23 0440 08/14/23 0341  WBC 14.2*  --  8.7 8.4 7.8 11.1*  HGB 11.3*   < > 9.7* 8.0* 7.0* 9.6*  HCT 36.7*   < > 32.3* 26.5* 22.8* 29.7*  MCV 87.2  --  89.2 88.3 89.1 85.8  PLT 216  --  223 143* 119* 117*   < > = values in this interval not displayed.   Blood Culture    Component Value Date/Time   SDES URINE, CLEAN CATCH 11/20/2020 1221   SPECREQUEST NONE 11/20/2020 1221   CULT  11/20/2020 1221    NO GROWTH Performed at [facility], [address] [place], [place] [address]    REPTSTATUS 11/21/2020 FINAL 11/20/2020 1221    Cardiac Enzymes: Recent Labs  Lab 08/11/23 1546  CKTOTAL 514*   CBG: Recent Labs  Lab 08/11/23 2353 08/12/23 0734 08/12/23 1202 08/14/23 0915  08/14/23 1222  GLUCAP 87 77 121* 192* 128*   Iron Studies:  Recent Labs    08/12/23 1604  IRON 16*  TIBC 353  FERRITIN 25   @[email or web address] @ Studies/Results: CT ABDOMEN PELVIS WO CONTRAST Result Date: 08/13/2023 CLINICAL DATA:  Abdominal pain, chronic pancreatitis. EXAM: CT ABDOMEN AND PELVIS WITHOUT CONTRAST TECHNIQUE: Multidetector CT imaging of the abdomen and pelvis was performed following the standard protocol without IV contrast. RADIATION DOSE REDUCTION: This exam was performed according to the departmental dose-optimization program which includes automated exposure control, adjustment of the mA and/or kV according to patient size and/or use of iterative reconstruction technique. COMPARISON:  07/28/2023. FINDINGS: Lower chest: No acute abnormality. Hepatobiliary: No focal liver abnormality is seen. No gallstones, gallbladder wall thickening, or biliary dilatation. Pancreas: Multiple calcifications are noted in the pancreas, compatible with history of chronic pancreatitis. There is a stable cyst in the pancreatic tail measuring 4.2 x 3.6 cm. The smaller cystic lesions are not well seen due to lack of IV contrast. No surrounding inflammatory changes are seen. Spleen: Normal in size without focal abnormality. Adrenals/Urinary Tract:  The adrenal glands are within normal limits. No renal calculus or obstructive uropathy bilaterally. Foley catheter is present in the urinary bladder in the bladder is decompressed. Stomach/Bowel: There is diffuse gastric wall thickening. No bowel obstruction, free air, or pneumatosis. The appendix is not seen. No focal bowel wall thickening or surrounding inflammatory changes. Vascular/Lymphatic: Aortic atherosclerosis. No enlarged abdominal or pelvic lymph nodes. Reproductive: Prostate is unremarkable. Other: No abdominopelvic ascites. Musculoskeletal: No acute osseous abnormality. IMPRESSION: 1. Stable findings of chronic pancreatitis with pseudocyst formation. No acute  surrounding inflammatory changes are seen. 2. Diffuse gastric wall thickening, suggesting gastritis. 3. The appendix is not seen. 4. Aortic atherosclerosis. Electronically Signed   By: [name] M.D.   On: 08/13/2023 18:57   Medications:  sodium chloride 125 mL/hr at 08/14/23 1037   thiamine (VITAMIN B1) injection 500 mg (08/14/23 0846)    chlordiazePOXIDE  5 mg Oral TID   Chlorhexidine Gluconate Cloth  6 each Topical Daily   folic acid  1 mg Oral Daily   lipase/protease/amylase  72,000 Units Oral TID WC   metoprolol succinate  200 mg Oral Daily   multivitamin with minerals  1 tablet Oral Daily   nicotine  21 mg Transdermal Daily   pantoprazole (PROTONIX) IV  40 mg Intravenous Q12H   sodium chloride flush  10-40 mL Intracatheter Q12H   sodium zirconium cyclosilicate  10 g Oral TID   sucralfate  1 g Oral TID WC & HS   tamsulosin  0.4 mg Oral Daily   Assessment/PlanPharoah Zayin Moran is an 53 y.o. male with alcoholism, chronic pancreatitis, ongoing tobacco use, poly substance abuse (u tox in past 2 months with benzos, cocaine, THC, opioids), HTN, A fib, WPW s/p ablation, GERD, bipolar d/o currently admitted with abd pain and diarrhea with course complicated by hyperkalemia for which nephrology is consulted.    **Hyperkalemia:  severe but no EKG changes.  In setting of hypovolemia and urinary obstruction.  CK just mildly elevated, no CBC changes to suggest pseudohyperK.   No other signs of poor perfusion, hemolysis or other tissue necrosis as K source. No RAASi.  With restoration of volume and relief of obstruction this quickly normalized.    - increased IVFs again - increased lokelma to TID - continue to monitor - sending A1c  -? RTA IV   **NAGMA:  recent serum bicarb levels in the 18-20 range acutely worse now in the setting of acute on chronic diarrhea.  Suspect it's all diarrhea related but could have RTA related to urinary obstruction. Improved s/p bicarb gtt.           **Acute on chronic diarrhea: chronic pancreatitis on creon. per primary   **Malnutrition secondary to malabsorption and poor intake: multiple electrolyte abnormalities including profoundly low phos, low ca, low mag.  Being repleted IV successfully.   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  Will be a chronic issue with ongoing EtOH and chronic pancreatitis.    **Anemia:  mild.    Bufford Buttner MD 08/14/2023, 1:50 PM  [facility] Pager: [phone]

## 2023-08-14 NOTE — TOC CM/SW Note (Signed)
 Transition of Care [facility]) - Inpatient Brief Assessment   Patient Details  Name: [name] MRN: [id number] Date of Birth: [birth year]  Transition of Care [facility]) CM/SW Contact:    Mearl Latin, LCSW Phone Number: 08/14/2023, 5:39 PM   Clinical Narrative: Patient admitted from home with pancreatitis. CSW following for substance use consult.     Transition of Care Asessment: Insurance and Status: Insurance coverage has been reviewed Patient has primary care physician: Yes Home environment has been reviewed: From home Prior level of function:: independent Prior/Current Home Services: No current home services Social Drivers of Health Review: SDOH reviewed no interventions necessary Readmission risk has been reviewed: Yes Transition of care needs: transition of care needs identified, TOC will continue to follow

## 2023-08-14 NOTE — Progress Notes (Signed)
 PROGRESS NOTE    [name]  ZOX:[id number] DOB: 10-Aug-1970 DOA: 08/10/2023 PCP: Grayce Sessions, NP    Chief Complaint  Patient presents with   Chest Pain    Brief Narrative:   [name] is a 53 y.o. male with PMH significant for chronic alcoholism, chronic pancreatitis with pancreatic pseudocyst, polysubstance abuse (tobacco, marijuana, cocaine), HTN, paroxysmal A-fib not on anticoagulation, WPW syndrome s/p ablation, GERD, bipolar disorder, anxiety/depression, h/o suicidal ideation.  Most recently hospitalized 10/1 to 10/5 for intractable abdominal pain secondary to chronic pancreatitis. - Patient workup significant for metabolic acidosis, dehydration, he endorses heavy alcohol use he drinks 6 large beers every day, as well he does endorse recent snorted cocaine on Monday, admitted for further workup, patient transferred to ICU/08/2023 for severe electrolyte derangement in the setting of severe acidosis, hyperkalemia hypophosphatemia and hypomagnesemia.     Assessment & Plan:   Principal Problem:   Intractable diarrhea   Intractable nausea, vomiting, abdominal pain Chronic pancreatitis with pseudocyst Presented with 24 hours of intractable abdominal symptoms, elevated creatinine Has chronic pancreatitis from alcohol.  Reports compliance to Creon. Lipase level normal. CT abdomen pelvis was done 2 weeks ago with no acute findings. Still reports difficulty with advancing diet, repeat CT abdomen pelvis was obtained, no acute finding in the pancreas beside chronic pancreatitis, known pseudocyst, but evidence of gastritis .  Gastritis  - Will start on Carafate scheduled and IV Protonix   AKI  Urinary retention -Continue with IV fluids -Started on Flomax  Severe hyperkalemia - Potassium> 7.5, this has improved with temporizing measure, no clear etiology - Trending up again, continue with IV insulin with D50, Lokelma, IV calcium gluconate, IV bicarb  push , he received IV Lasix as well . - Check A1c . - RTA type IV .  None anion gap metabolic acidosis - Due to volume depletion, dehydration, acute on chronic diarrhea, as well likely with underlying starvation ketoacidosis. - Improved with bicarb drip, bicarb within normal limit today, will DC bicarb  Alcohol abuse with early withdrawals -Patient reports he drinks up to 6 large beers per day, he is on CIWA protocol, restless and agitated will start on low-dose Librium as well. - Continue with high-dose thiamine and folic acid  Severe hypomagnesemia Severe hypophosphatemia - Repleted again today.  Leukocytosis No evidence of infection.  Likely reactive to pain.  Continue to monitor, resolved  Sinus tachycardia H/o paroxysmal A-fib H/o WPW syndrome s/p ablation Currently in sinus tachycardia due to dehydration. Continue metoprolol   Hypertension Continue metoprolol.  Expect improvement with pain control   GERD PPI  Malnutrition -Will start on supplements.  Anemia Globin down to 7 today, anemia workup significant for iron deficiency, hemoglobin is 7 this morning, this is most likely in the setting of alcohol abuse as well, will transfuse 1 unit PRBC I have discussed with the patient, he is agreeable to PRBC transfusion   polysubstance abuse (tobacco, marijuana, alcohol, cocaine) Counseled to quit, report last time he used this Monday   bipolar disorder, anxiety/depression, h/o suicidal ideation    DVT prophylaxis: Subcu heparin Code Status: Full code Family Communication: None at bedside Disposition:   Status is: Inpatient    Consultants:  PCCM Renal  Subjective:  Reports abdominal pain  Objective: Vitals:   08/14/23 0638 08/14/23 0900 08/14/23 0910 08/14/23 0911  BP:    (!) 157/83  Pulse: 71   83  Resp: 18 13 12 16   Temp:    98.4 F (  36.9 C)  TempSrc:    Oral  SpO2: 98%     Weight:      Height:        Intake/Output Summary (Last 24 hours) at  08/14/2023 1445 Last data filed at 08/14/2023 1610 Gross per 24 hour  Intake 1766.31 ml  Output 800 ml  Net 966.31 ml   Filed Weights   08/10/23 0959 08/12/23 0600  Weight: 59 kg 56.7 kg    Examination:  Awake Alert, Oriented X 3, frail, chronically ill-appearing Symmetrical Chest wall movement, Good air movement bilaterally, CTAB RRR,No Gallops,Rubs or new Murmurs, No Parasternal Heave +ve B.Sounds, Abd Soft, mild epigastric tenderness No Cyanosis, Clubbing or edema, No new Rash or bruise         Data Reviewed: I have personally reviewed following labs and imaging studies  CBC: Recent Labs  Lab 08/10/23 1129 08/10/23 1608 08/11/23 0521 08/12/23 0237 08/13/23 0440 08/14/23 0341  WBC 14.2*  --  8.7 8.4 7.8 11.1*  HGB 11.3* 15.0 9.7* 8.0* 7.0* 9.6*  HCT 36.7* 44.0 32.3* 26.5* 22.8* 29.7*  MCV 87.2  --  89.2 88.3 89.1 85.8  PLT 216  --  223 143* 119* 117*    Basic Metabolic Panel: Recent Labs  Lab 08/11/23 2058 08/12/23 0237 08/12/23 0905 08/12/23 1604 08/13/23 0440 08/13/23 1001 08/14/23 0341 08/14/23 1257  NA 140 138  --  138 136 135 136 135  K 3.9 3.9   < > 4.5 5.7* 5.2* 5.8* 5.9*  CL 114* 115*  --  113* 111 104 108 103  CO2 16* 16*  --  18* 19* 23 20* 24  GLUCOSE 87 82  --  126* 87 100* 86 106*  BUN 8 6  --  <5* 7 6 7 6   CREATININE 0.78 0.72  --  0.91 0.84 0.84 0.83 0.84  CALCIUM 6.1* 8.3*  --  6.9* 7.1* 8.2* 7.2* 8.5*  MG 0.6* 2.0  --  1.2* 1.3*  --  1.0* 1.6*  PHOS 3.5 3.3  --  3.6 2.3*  --  3.2  --    < > = values in this interval not displayed.    GFR: Estimated Creatinine Clearance: 80.6 mL/min (by C-G formula based on SCr of 0.84 mg/dL).  Liver Function Tests: Recent Labs  Lab 08/10/23 1129 08/13/23 1001  AST 53* 77*  ALT 29 33  ALKPHOS 108 83  BILITOT 1.6* 0.5  PROT 8.6* 6.9  ALBUMIN 4.2 3.4*    CBG: Recent Labs  Lab 08/11/23 2353 08/12/23 0734 08/12/23 1202 08/14/23 0915 08/14/23 1222  GLUCAP 87 77 121* 192* 128*      Recent Results (from the past 240 hours)  MRSA Next Gen by PCR, Nasal     Status: None   Collection Time: 08/11/23  8:02 PM   Specimen: Nasal Mucosa; Nasal Swab  Result Value Ref Range Status   MRSA by PCR Next Gen NOT DETECTED NOT DETECTED Final    Comment: (NOTE) The GeneXpert MRSA Assay (FDA approved for NASAL specimens only), is one component of a comprehensive MRSA colonization surveillance program. It is not intended to diagnose MRSA infection nor to guide or monitor treatment for MRSA infections. Test performance is not FDA approved in patients less than 84 years old. Performed at [facility], [address], [place], [place] [address]          Radiology Studies: CT ABDOMEN PELVIS WO CONTRAST Result Date: 08/13/2023 CLINICAL DATA:  Abdominal pain, chronic  pancreatitis. EXAM: CT ABDOMEN AND PELVIS WITHOUT CONTRAST TECHNIQUE: Multidetector CT imaging of the abdomen and pelvis was performed following the standard protocol without IV contrast. RADIATION DOSE REDUCTION: This exam was performed according to the departmental dose-optimization program which includes automated exposure control, adjustment of the mA and/or kV according to patient size and/or use of iterative reconstruction technique. COMPARISON:  07/28/2023. FINDINGS: Lower chest: No acute abnormality. Hepatobiliary: No focal liver abnormality is seen. No gallstones, gallbladder wall thickening, or biliary dilatation. Pancreas: Multiple calcifications are noted in the pancreas, compatible with history of chronic pancreatitis. There is a stable cyst in the pancreatic tail measuring 4.2 x 3.6 cm. The smaller cystic lesions are not well seen due to lack of IV contrast. No surrounding inflammatory changes are seen. Spleen: Normal in size without focal abnormality. Adrenals/Urinary Tract: The adrenal glands are within normal limits. No renal calculus or obstructive uropathy bilaterally. Foley catheter is present in the  urinary bladder in the bladder is decompressed. Stomach/Bowel: There is diffuse gastric wall thickening. No bowel obstruction, free air, or pneumatosis. The appendix is not seen. No focal bowel wall thickening or surrounding inflammatory changes. Vascular/Lymphatic: Aortic atherosclerosis. No enlarged abdominal or pelvic lymph nodes. Reproductive: Prostate is unremarkable. Other: No abdominopelvic ascites. Musculoskeletal: No acute osseous abnormality. IMPRESSION: 1. Stable findings of chronic pancreatitis with pseudocyst formation. No acute surrounding inflammatory changes are seen. 2. Diffuse gastric wall thickening, suggesting gastritis. 3. The appendix is not seen. 4. Aortic atherosclerosis. Electronically Signed   By: Thornell Sartorius M.D.   On: 08/13/2023 18:57        Scheduled Meds:  chlordiazePOXIDE  5 mg Oral TID   Chlorhexidine Gluconate Cloth  6 each Topical Daily   folic acid  1 mg Oral Daily   lipase/protease/amylase  72,000 Units Oral TID WC   metoprolol succinate  200 mg Oral Daily   multivitamin with minerals  1 tablet Oral Daily   nicotine  21 mg Transdermal Daily   pantoprazole (PROTONIX) IV  40 mg Intravenous Q12H   sodium chloride flush  10-40 mL Intracatheter Q12H   sodium zirconium cyclosilicate  10 g Oral TID   sucralfate  1 g Oral TID WC & HS   tamsulosin  0.4 mg Oral Daily   Continuous Infusions:  sodium chloride 1,000 mL (08/14/23 1425)   thiamine (VITAMIN B1) injection 500 mg (08/14/23 0846)     LOS: 4 days        Huey Bienenstock, MD [facility]   To contact the attending provider between 7A-7P or the covering provider during after hours 7P-7A, please log into the web site [email or web address] and access using universal  password for that web site. If you do not have the password, please call the hospital operator.  08/14/2023, 2:45 PM

## 2023-08-15 DIAGNOSIS — E875 Hyperkalemia: Secondary | ICD-10-CM

## 2023-08-15 DIAGNOSIS — N179 Acute kidney failure, unspecified: Secondary | ICD-10-CM

## 2023-08-15 LAB — BASIC METABOLIC PANEL WITH GFR
Anion gap: 9 (ref 5–15)
Anion gap: 9 (ref 5–15)
BUN: 8 mg/dL (ref 6–20)
BUN: 8 mg/dL (ref 6–20)
CO2: 23 mmol/L (ref 22–32)
CO2: 24 mmol/L (ref 22–32)
Calcium: 8.7 mg/dL — ABNORMAL LOW (ref 8.9–10.3)
Calcium: 8.6 mg/dL — ABNORMAL LOW (ref 8.9–10.3)
Chloride: 101 mmol/L (ref 98–111)
Chloride: 104 mmol/L (ref 98–111)
Creatinine, Ser: 1 mg/dL (ref 0.61–1.24)
Creatinine, Ser: 0.77 mg/dL (ref 0.61–1.24)
GFR, Estimated: >60 mL/min (ref >60)
GFR, Estimated: >60 mL/min (ref >60)
Glucose, Bld: 129 mg/dL — ABNORMAL HIGH (ref 70–99)
Glucose, Bld: 52 mg/dL — ABNORMAL LOW (ref 70–99)
Potassium: 4.3 mmol/L (ref 3.5–5.1)
Potassium: 5.3 mmol/L — ABNORMAL HIGH (ref 3.5–5.1)
Sodium: 133 mmol/L — ABNORMAL LOW (ref 135–145)
Sodium: 137 mmol/L (ref 135–145)

## 2023-08-15 LAB — CBC
HCT: 32.1 % — ABNORMAL LOW (ref 39.0–52.0)
Hemoglobin: 10.1 g/dL — ABNORMAL LOW (ref 13.0–17.0)
MCH: 27.2 pg (ref 26.0–34.0)
MCHC: 31.5 g/dL (ref 30.0–36.0)
MCV: 86.5 fL (ref 80.0–100.0)
Platelets: 129 10*3/uL — ABNORMAL LOW (ref 150–400)
RBC: 3.71 MIL/uL — ABNORMAL LOW (ref 4.22–5.81)
RDW: 17.8 % — ABNORMAL HIGH (ref 11.5–15.5)
WBC: 9.4 10*3/uL (ref 4.0–10.5)
nRBC: 0 % (ref 0.0–0.2)

## 2023-08-15 LAB — MAGNESIUM: Magnesium: 1.2 mg/dL — ABNORMAL LOW (ref 1.7–2.4)

## 2023-08-15 LAB — PHOSPHORUS: Phosphorus: 3.5 mg/dL (ref 2.5–4.6)

## 2023-08-15 MED ORDER — FUROSEMIDE 10 MG/ML IJ SOLN
40.0000 mg | Freq: Once | INTRAMUSCULAR | Status: AC
  Administered 2023-08-15: 40 mg via INTRAVENOUS
  Filled 2023-08-15: qty 4

## 2023-08-15 MED ORDER — MAGNESIUM SULFATE 4 GM/100ML IV SOLN
4.0000 g | Freq: Once | INTRAVENOUS | Status: AC
  Administered 2023-08-15: 4 g via INTRAVENOUS
  Filled 2023-08-15: qty 100

## 2023-08-15 MED ORDER — SODIUM ZIRCONIUM CYCLOSILICATE 10 G PO PACK
10.0000 g | PACK | Freq: Three times a day (TID) | ORAL | Status: AC
  Administered 2023-08-15 (×3): 10 g via ORAL
  Filled 2023-08-15 (×3): qty 1

## 2023-08-15 MED ORDER — MORPHINE SULFATE (PF) 2 MG/ML IV SOLN
1.0000 mg | Freq: Three times a day (TID) | INTRAVENOUS | Status: DC | PRN
  Administered 2023-08-15 – 2023-08-21 (×17): 2 mg via INTRAVENOUS
  Filled 2023-08-15 (×17): qty 1

## 2023-08-15 MED ORDER — SODIUM ZIRCONIUM CYCLOSILICATE 10 G PO PACK
10.0000 g | PACK | Freq: Three times a day (TID) | ORAL | Status: DC
Start: 2023-08-15 — End: 2023-08-15

## 2023-08-15 MED ORDER — OXYCODONE HCL 5 MG PO TABS
5.0000 mg | ORAL_TABLET | Freq: Three times a day (TID) | ORAL | Status: DC | PRN
  Administered 2023-08-15 – 2023-08-21 (×15): 5 mg via ORAL
  Filled 2023-08-15 (×16): qty 1

## 2023-08-15 NOTE — Progress Notes (Signed)
 PROGRESS NOTE    [name]  ZOX:[id number] DOB: [birth year] DOA: 08/10/2023 PCP: Grayce Sessions, NP    Chief Complaint  Patient presents with   Chest Pain    Brief Narrative:   [name] is a 53 y.o. male with PMH significant for chronic alcoholism, chronic pancreatitis with pancreatic pseudocyst, polysubstance abuse (tobacco, marijuana, cocaine), HTN, paroxysmal A-fib not on anticoagulation, WPW syndrome s/p ablation, GERD, bipolar disorder, anxiety/depression, h/o suicidal ideation.  Most recently hospitalized 10/1 to 10/5 for intractable abdominal pain secondary to chronic pancreatitis. - Patient workup significant for metabolic acidosis, dehydration, he endorses heavy alcohol use he drinks 6 large beers every day, as well he does endorse recent snorted cocaine on Monday, admitted for further workup, patient transferred to ICU/08/2023 for severe electrolyte derangement in the setting of severe acidosis, hyperkalemia hypophosphatemia and hypomagnesemia.     Assessment & Plan:   Principal Problem:   Intractable diarrhea   Intractable nausea, vomiting, abdominal pain Chronic pancreatitis with pseudocyst Presented with 24 hours of intractable abdominal symptoms, elevated creatinine Has chronic pancreatitis from alcohol.  Reports compliance to Creon. Lipase level normal. CT abdomen pelvis was done 2 weeks ago with no acute findings. Still reports difficulty with advancing diet, repeat CT abdomen pelvis was obtained, no acute finding in the pancreas beside chronic pancreatitis, known pseudocyst, but evidence of gastritis .  Gastritis  - Startedstarted  on Carafate scheduled and IV Protonix   AKI  Urinary retention -Continue with IV fluids -Started on Flomax - Resolved  Severe hyperkalemia - Potassium> 7.5, this has improved with temporizing measure, no clear etiology - Management per renal, initially felt secondary to dehydration and urinary  retention - Remains elevated, continue with scheduled lactulose, no evidence of diabetes, possible RTA type IV  None anion gap metabolic acidosis - Due to volume depletion, dehydration, acute on chronic diarrhea, as well likely with underlying starvation ketoacidosis. - Bicarb drip discontinued as it has normalized.  Alcohol abuse with early withdrawals -Patient reports he drinks up to 6 large beers per day, he is on CIWA protocol, restless and agitated will start on low-dose Librium as well. - Continue with high-dose thiamine and folic acid  Severe hypomagnesemia Severe hypophosphatemia - Repleted again today.  Leukocytosis No evidence of infection.  Likely reactive to pain.  Continue to monitor, resolved  Sinus tachycardia H/o paroxysmal A-fib H/o WPW syndrome s/p ablation Currently in sinus tachycardia due to dehydration. Continue metoprolol   Hypertension Continue metoprolol.  Expect improvement with pain control   GERD PPI  Malnutrition -on supplements.  Anemia Hemoglobin dropped to 7, this is likely delusional, he was transfused 1 unit PRBC   polysubstance abuse (tobacco, marijuana, alcohol, cocaine) Counseled to quit, report last time he used this Monday   bipolar disorder, anxiety/depression, h/o suicidal ideation    DVT prophylaxis: Subcu heparin Code Status: Full code Family Communication: None at bedside Disposition:   Status is: Inpatient    Consultants:  PCCM Renal  Subjective:  Appetite has improved, asking if he can advance his appetite, abdominal pain improved.  Objective: Vitals:   08/15/23 0000 08/15/23 0700 08/15/23 1100 08/15/23 1500  BP: (!) 139/91 132/88 (!) 121/90 119/82  Pulse: 68     Resp: 16     Temp: 97.6 F (36.4 C) 98.2 F (36.8 C) 98.3 F (36.8 C) 97.9 F (36.6 C)  TempSrc: Oral Oral Oral Oral  SpO2: 96%  100%   Weight:      Height:  No intake or output data in the 24 hours ending 08/15/23 1558  Filed  Weights   08/10/23 0959 08/12/23 0600  Weight: 59 kg 56.7 kg    Examination:  Awake Alert, Oriented X 3, frail, chronically ill-appearing Symmetrical Chest wall movement, Good air movement bilaterally, CTAB RRR,No Gallops,Rubs or new Murmurs, No Parasternal Heave +ve B.Sounds, Abd Soft, mild epigastric tenderness No Cyanosis, Clubbing or edema, No new Rash or bruise          Data Reviewed: I have personally reviewed following labs and imaging studies  CBC: Recent Labs  Lab 08/11/23 0521 08/12/23 0237 08/13/23 0440 08/14/23 0341 08/15/23 0307  WBC 8.7 8.4 7.8 11.1* 9.4  HGB 9.7* 8.0* 7.0* 9.6* 10.1*  HCT 32.3* 26.5* 22.8* 29.7* 32.1*  MCV 89.2 88.3 89.1 85.8 86.5  PLT 223 143* 119* 117* 129*    Basic Metabolic Panel: Recent Labs  Lab 08/12/23 0237 08/12/23 0905 08/12/23 1604 08/13/23 0440 08/13/23 1001 08/14/23 0341 08/14/23 1257 08/14/23 2321 08/15/23 0307  NA 138  --  138 136 135 136 135 133* 137  K 3.9   < > 4.5 5.7* 5.2* 5.8* 5.9* 4.3 5.3*  CL 115*  --  113* 111 104 108 103 101 104  CO2 16*  --  18* 19* 23 20* 24 23 24   GLUCOSE 82  --  126* 87 100* 86 106* 129* 52*  BUN 6  --  <5* 7 6 7 6 8 8   CREATININE 0.72  --  0.91 0.84 0.84 0.83 0.84 1.00 0.77  CALCIUM 8.3*  --  6.9* 7.1* 8.2* 7.2* 8.5* 8.7* 8.6*  MG 2.0  --  1.2* 1.3*  --  1.0* 1.6*  --  1.2*  PHOS 3.3  --  3.6 2.3*  --  3.2  --   --  3.5   < > = values in this interval not displayed.    GFR: Estimated Creatinine Clearance: 84.7 mL/min (by C-G formula based on SCr of 0.77 mg/dL).  Liver Function Tests: Recent Labs  Lab 08/10/23 1129 08/13/23 1001  AST 53* 77*  ALT 29 33  ALKPHOS 108 83  BILITOT 1.6* 0.5  PROT 8.6* 6.9  ALBUMIN 4.2 3.4*    CBG: Recent Labs  Lab 08/12/23 0734 08/12/23 1202 08/14/23 0915 08/14/23 1222 08/14/23 1642  GLUCAP 77 121* 192* 128* 209*     Recent Results (from the past 240 hours)  MRSA Next Gen by PCR, Nasal     Status: None   Collection Time:  08/11/23  8:02 PM   Specimen: Nasal Mucosa; Nasal Swab  Result Value Ref Range Status   MRSA by PCR Next Gen NOT DETECTED NOT DETECTED Final    Comment: (NOTE) The GeneXpert MRSA Assay (FDA approved for NASAL specimens only), is one component of a comprehensive MRSA colonization surveillance program. It is not intended to diagnose MRSA infection nor to guide or monitor treatment for MRSA infections. Test performance is not FDA approved in patients less than 63 years old. Performed at [facility], [address] [place], [place] [address]          Radiology Studies: No results found.       Scheduled Meds:  chlordiazePOXIDE  5 mg Oral BID   Followed by   Melene Muller ON 08/16/2023] chlordiazePOXIDE  5 mg Oral Daily   Chlorhexidine Gluconate Cloth  6 each Topical Daily   folic acid  1 mg Oral Daily   lipase/protease/amylase  72,000 Units  Oral TID WC   metoprolol succinate  200 mg Oral Daily   multivitamin with minerals  1 tablet Oral Daily   nicotine  21 mg Transdermal Daily   pantoprazole (PROTONIX) IV  40 mg Intravenous Q12H   sodium chloride flush  10-40 mL Intracatheter Q12H   sodium zirconium cyclosilicate  10 g Oral TID   sucralfate  1 g Oral TID WC & HS   tamsulosin  0.4 mg Oral Daily   Continuous Infusions:  thiamine (VITAMIN B1) injection 500 mg (08/15/23 0921)     LOS: 5 days        Huey Bienenstock, MD [facility]   To contact the attending provider between 7A-7P or the covering provider during after hours 7P-7A, please log into the web site [email or web address] and access using universal [place] password for that web site. If you do not have the password, please call the hospital operator.  08/15/2023, 3:58 PM

## 2023-08-15 NOTE — Progress Notes (Signed)
 [facility] Progress Note   Subjective:   Seen in room. K better but requiring more treatment than would be expected for eGFR.  Contine lokelma TID  Objective Vitals:   08/14/23 2020 08/15/23 0000 08/15/23 0700 08/15/23 1100  BP: (!) 144/96 (!) 139/91 132/88 (!) 121/90  Pulse: 73 68    Resp: 18 16    Temp:  97.6 F (36.4 C) 98.2 F (36.8 C) 98.3 F (36.8 C)  TempSrc:  Oral Oral Oral  SpO2: 98% 96%  100%  Weight:      Height:       Physical Exam General: chronically ill appearing but nontoxic Heart:RRR Lungs: clear Abdomen: soft, mildly TTP  Extremities: no edema  Additional Objective Labs: Basic Metabolic Panel: Recent Labs  Lab 08/13/23 0440 08/13/23 1001 08/14/23 0341 08/14/23 1257 08/14/23 2321 08/15/23 0307  NA 136   < > 136 135 133* 137  K 5.7*   < > 5.8* 5.9* 4.3 5.3*  CL 111   < > 108 103 101 104  CO2 19*   < > 20* 24 23 24   GLUCOSE 87   < > 86 106* 129* 52*  BUN 7   < > 7 6 8 8   CREATININE 0.84   < > 0.83 0.84 1.00 0.77  CALCIUM 7.1*   < > 7.2* 8.5* 8.7* 8.6*  PHOS 2.3*  --  3.2  --   --  3.5   < > = values in this interval not displayed.   Liver Function Tests: Recent Labs  Lab 08/10/23 1129 08/13/23 1001  AST 53* 77*  ALT 29 33  ALKPHOS 108 83  BILITOT 1.6* 0.5  PROT 8.6* 6.9  ALBUMIN 4.2 3.4*   Recent Labs  Lab 08/10/23 1129 08/11/23 1546  LIPASE 21 21   CBC: Recent Labs  Lab 08/11/23 0521 08/12/23 0237 08/13/23 0440 08/14/23 0341 08/15/23 0307  WBC 8.7 8.4 7.8 11.1* 9.4  HGB 9.7* 8.0* 7.0* 9.6* 10.1*  HCT 32.3* 26.5* 22.8* 29.7* 32.1*  MCV 89.2 88.3 89.1 85.8 86.5  PLT 223 143* 119* 117* 129*   Blood Culture    Component Value Date/Time   SDES URINE, CLEAN CATCH 11/20/2020 1221   SPECREQUEST NONE 11/20/2020 1221   CULT  11/20/2020 1221    NO GROWTH Performed at [facility], [address], [place], [place] [address]    REPTSTATUS 11/21/2020 FINAL 11/20/2020 1221    Cardiac Enzymes: Recent  Labs  Lab 08/11/23 1546  CKTOTAL 514*   CBG: Recent Labs  Lab 08/12/23 0734 08/12/23 1202 08/14/23 0915 08/14/23 1222 08/14/23 1642  GLUCAP 77 121* 192* 128* 209*   Iron Studies:  Recent Labs    08/12/23 1604  IRON 16*  TIBC 353  FERRITIN 25   @[email or web address] @ Studies/Results: CT ABDOMEN PELVIS WO CONTRAST Result Date: 08/13/2023 CLINICAL DATA:  Abdominal pain, chronic pancreatitis. EXAM: CT ABDOMEN AND PELVIS WITHOUT CONTRAST TECHNIQUE: Multidetector CT imaging of the abdomen and pelvis was performed following the standard protocol without IV contrast. RADIATION DOSE REDUCTION: This exam was performed according to the departmental dose-optimization program which includes automated exposure control, adjustment of the mA and/or kV according to patient size and/or use of iterative reconstruction technique. COMPARISON:  07/28/2023. FINDINGS: Lower chest: No acute abnormality. Hepatobiliary: No focal liver abnormality is seen. No gallstones, gallbladder wall thickening, or biliary dilatation. Pancreas: Multiple calcifications are noted in the pancreas, compatible with history of chronic pancreatitis. There is a stable cyst in  the pancreatic tail measuring 4.2 x 3.6 cm. The smaller cystic lesions are not well seen due to lack of IV contrast. No surrounding inflammatory changes are seen. Spleen: Normal in size without focal abnormality. Adrenals/Urinary Tract: The adrenal glands are within normal limits. No renal calculus or obstructive uropathy bilaterally. Foley catheter is present in the urinary bladder in the bladder is decompressed. Stomach/Bowel: There is diffuse gastric wall thickening. No bowel obstruction, free air, or pneumatosis. The appendix is not seen. No focal bowel wall thickening or surrounding inflammatory changes. Vascular/Lymphatic: Aortic atherosclerosis. No enlarged abdominal or pelvic lymph nodes. Reproductive: Prostate is unremarkable. Other: No abdominopelvic ascites.  Musculoskeletal: No acute osseous abnormality. IMPRESSION: 1. Stable findings of chronic pancreatitis with pseudocyst formation. No acute surrounding inflammatory changes are seen. 2. Diffuse gastric wall thickening, suggesting gastritis. 3. The appendix is not seen. 4. Aortic atherosclerosis. Electronically Signed   By: Thornell Sartorius M.D.   On: 08/13/2023 18:57   Medications:  thiamine (VITAMIN B1) injection 500 mg (08/15/23 0921)    chlordiazePOXIDE  5 mg Oral BID   Followed by   Melene Muller ON 08/16/2023] chlordiazePOXIDE  5 mg Oral Daily   Chlorhexidine Gluconate Cloth  6 each Topical Daily   folic acid  1 mg Oral Daily   lipase/protease/amylase  72,000 Units Oral TID WC   metoprolol succinate  200 mg Oral Daily   multivitamin with minerals  1 tablet Oral Daily   nicotine  21 mg Transdermal Daily   pantoprazole (PROTONIX) IV  40 mg Intravenous Q12H   sodium chloride flush  10-40 mL Intracatheter Q12H   sodium zirconium cyclosilicate  10 g Oral TID   sucralfate  1 g Oral TID WC & HS   tamsulosin  0.4 mg Oral Daily   Assessment/PlanPharoah Neiko Trivedi is an 53 y.o. male with alcoholism, chronic pancreatitis, ongoing tobacco use, poly substance abuse (u tox in past 2 months with benzos, cocaine, THC, opioids), HTN, A fib, WPW s/p ablation, GERD, bipolar d/o currently admitted with abd pain and diarrhea with course complicated by hyperkalemia for which nephrology is consulted.    **Hyperkalemia:  severe but no EKG changes.  In setting of hypovolemia and urinary obstruction.  CK just mildly elevated, no CBC changes to suggest pseudohyperK.   No other signs of poor perfusion, hemolysis or other tissue necrosis as K source. No RAASi.  With restoration of volume and relief of obstruction this quickly normalized but is up again   - increased lokelma to TID - continue to monitor - sending A1c WNL -? RTA IV   **NAGMA:  recent serum bicarb levels in the 18-20 range acutely worse now in the setting of  acute on chronic diarrhea.  Suspect it's all diarrhea related but could have RTA related to urinary obstruction. Improved s/p bicarb gtt.          **Acute on chronic diarrhea: chronic pancreatitis on creon. per primary   **Malnutrition secondary to malabsorption and poor intake: multiple electrolyte abnormalities including profoundly low phos, low ca, low mag.  Being repleted IV successfully.   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  Will be a chronic issue with ongoing EtOH and chronic pancreatitis.    **Anemia:  mild.    Bufford Buttner MD 08/15/2023, 1:48 PM  [facility] Pager: [phone]

## 2023-08-16 ENCOUNTER — Telehealth (INDEPENDENT_AMBULATORY_CARE_PROVIDER_SITE_OTHER): Payer: Self-pay | Admitting: Primary Care

## 2023-08-16 DIAGNOSIS — I10 Essential (primary) hypertension: Secondary | ICD-10-CM

## 2023-08-16 DIAGNOSIS — R197 Diarrhea, unspecified: Secondary | ICD-10-CM | POA: Diagnosis not present

## 2023-08-16 DIAGNOSIS — E875 Hyperkalemia: Secondary | ICD-10-CM | POA: Diagnosis not present

## 2023-08-16 LAB — VOLATILES,BLD-ACETONE,ETHANOL,ISOPROP,METHANOL: Ethanol, blood: <0.01 g/dL (ref 0.000–0.010)

## 2023-08-16 LAB — CBC
HCT: 32.3 % — ABNORMAL LOW (ref 39.0–52.0)
Hemoglobin: 10.1 g/dL — ABNORMAL LOW (ref 13.0–17.0)
MCH: 27.4 pg (ref 26.0–34.0)
MCHC: 31.3 g/dL (ref 30.0–36.0)
MCV: 87.5 fL (ref 80.0–100.0)
Platelets: 150 10*3/uL (ref 150–400)
RBC: 3.69 MIL/uL — ABNORMAL LOW (ref 4.22–5.81)
RDW: 17.9 % — ABNORMAL HIGH (ref 11.5–15.5)
WBC: 10.9 10*3/uL — ABNORMAL HIGH (ref 4.0–10.5)
nRBC: 0 % (ref 0.0–0.2)

## 2023-08-16 LAB — BASIC METABOLIC PANEL WITH GFR
Anion gap: 10 (ref 5–15)
BUN: 9 mg/dL (ref 6–20)
CO2: 24 mmol/L (ref 22–32)
Calcium: 8.4 mg/dL — ABNORMAL LOW (ref 8.9–10.3)
Chloride: 102 mmol/L (ref 98–111)
Creatinine, Ser: 1.06 mg/dL (ref 0.61–1.24)
GFR, Estimated: >60 mL/min (ref >60)
Glucose, Bld: 80 mg/dL (ref 70–99)
Potassium: 4.8 mmol/L (ref 3.5–5.1)
Sodium: 136 mmol/L (ref 135–145)

## 2023-08-16 LAB — MAGNESIUM
Magnesium: 0.9 mg/dL — CL (ref 1.7–2.4)
Magnesium: 1.5 mg/dL — ABNORMAL LOW (ref 1.7–2.4)

## 2023-08-16 LAB — TROPONIN I (HIGH SENSITIVITY): Troponin I (High Sensitivity): 4 ng/L (ref <18)

## 2023-08-16 MED ORDER — MAGNESIUM SULFATE 4 GM/100ML IV SOLN
4.0000 g | Freq: Once | INTRAVENOUS | Status: AC
  Administered 2023-08-16: 4 g via INTRAVENOUS
  Filled 2023-08-16: qty 100

## 2023-08-16 MED ORDER — MORPHINE SULFATE (PF) 2 MG/ML IV SOLN
2.0000 mg | Freq: Once | INTRAVENOUS | Status: AC
  Administered 2023-08-16: 2 mg via INTRAVENOUS
  Filled 2023-08-16: qty 1

## 2023-08-16 NOTE — Progress Notes (Signed)
 [facility] Progress Note   Subjective:   Seen in room. Pt reports that Foley is leaking--> ? If became clogged  Objective Vitals:   08/15/23 2306 08/16/23 0200 08/16/23 0342 08/16/23 0958  BP: 131/87  (!) 147/95 (!) 124/94  Pulse: 87  77 100  Resp: 14 18 20 20   Temp: 98.3 F (36.8 C)  98.1 F (36.7 C)   TempSrc: Oral  Oral   SpO2:      Weight:      Height:       Physical Exam General: chronically ill appearing but nontoxic Heart:RRR Lungs: clear Abdomen: soft, mildly TTP  Extremities: no edema  Additional Objective Labs: Basic Metabolic Panel: Recent Labs  Lab 08/13/23 0440 08/13/23 1001 08/14/23 0341 08/14/23 1257 08/14/23 2321 08/15/23 0307 08/16/23 0300  NA 136   < > 136   < > 133* 137 136  K 5.7*   < > 5.8*   < > 4.3 5.3* 4.8  CL 111   < > 108   < > 101 104 102  CO2 19*   < > 20*   < > 23 24 24   GLUCOSE 87   < > 86   < > 129* 52* 80  BUN 7   < > 7   < > 8 8 9   CREATININE 0.84   < > 0.83   < > 1.00 0.77 1.06  CALCIUM 7.1*   < > 7.2*   < > 8.7* 8.6* 8.4*  PHOS 2.3*  --  3.2  --   --  3.5  --    < > = values in this interval not displayed.   Liver Function Tests: Recent Labs  Lab 08/10/23 1129 08/13/23 1001  AST 53* 77*  ALT 29 33  ALKPHOS 108 83  BILITOT 1.6* 0.5  PROT 8.6* 6.9  ALBUMIN 4.2 3.4*   Recent Labs  Lab 08/10/23 1129 08/11/23 1546  LIPASE 21 21   CBC: Recent Labs  Lab 08/12/23 0237 08/13/23 0440 08/14/23 0341 08/15/23 0307 08/16/23 0300  WBC 8.4 7.8 11.1* 9.4 10.9*  HGB 8.0* 7.0* 9.6* 10.1* 10.1*  HCT 26.5* 22.8* 29.7* 32.1* 32.3*  MCV 88.3 89.1 85.8 86.5 87.5  PLT 143* 119* 117* 129* 150   Blood Culture    Component Value Date/Time   SDES URINE, CLEAN CATCH 11/20/2020 1221   SPECREQUEST NONE 11/20/2020 1221   CULT  11/20/2020 1221    NO GROWTH Performed at [facility], [address], [place], [place] [address]    REPTSTATUS 11/21/2020 FINAL 11/20/2020 1221    Cardiac Enzymes: Recent  Labs  Lab 08/11/23 1546  CKTOTAL 514*   CBG: Recent Labs  Lab 08/12/23 0734 08/12/23 1202 08/14/23 0915 08/14/23 1222 08/14/23 1642  GLUCAP 77 121* 192* 128* 209*   Iron Studies:  No results for input(s): "IRON", "TIBC", "TRANSFERRIN", "FERRITIN" in the last 72 hours.  @lablastinr3 @ Studies/Results: No results found.  Medications:  thiamine (VITAMIN B1) injection Stopped (08/16/23 1030)    Chlorhexidine Gluconate Cloth  6 each Topical Daily   folic acid  1 mg Oral Daily   lipase/protease/amylase  72,000 Units Oral TID WC   metoprolol succinate  200 mg Oral Daily   multivitamin with minerals  1 tablet Oral Daily   nicotine  21 mg Transdermal Daily   pantoprazole (PROTONIX) IV  40 mg Intravenous Q12H   sodium chloride flush  10-40 mL Intracatheter Q12H   sucralfate  1 g  Oral TID WC & HS   tamsulosin  0.4 mg Oral Daily   Assessment/PlanPharoah Arris Meyn is an 53 y.o. male with alcoholism, chronic pancreatitis, ongoing tobacco use, poly substance abuse (u tox in past 2 months with benzos, cocaine, THC, opioids), HTN, A fib, WPW s/p ablation, GERD, bipolar d/o currently admitted with abd pain and diarrhea with course complicated by hyperkalemia for which nephrology is consulted.    **Hyperkalemia:  severe but no EKG changes.  In setting of hypovolemia and urinary obstruction.  CK just mildly elevated, no CBC changes to suggest pseudohyperK.   No other signs of poor perfusion, hemolysis or other tissue necrosis as K source. No RAASi.  With restoration of volume and relief of obstruction this quickly normalized but is up again   - increased lokelma to TID - continue to monitor - sending A1c WNL -? RTA IV - will remove Foley and do a trial of void    **NAGMA:  recent serum bicarb levels in the 18-20 range acutely worse now in the setting of acute on chronic diarrhea.  Suspect it's all diarrhea related but could have RTA related to urinary obstruction. Improved s/p bicarb  gtt.          **Acute on chronic diarrhea: chronic pancreatitis on creon. per primary   **Malnutrition secondary to malabsorption and poor intake: multiple electrolyte abnormalities including profoundly low phos, low ca, low mag.  Being repleted IV successfully.   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  Will be a chronic issue with ongoing EtOH and chronic pancreatitis.    **Anemia:  mild.    Bufford Buttner MD 08/16/2023, 1:39 PM  [facility] Pager: [phone]

## 2023-08-16 NOTE — Progress Notes (Signed)
 VAST consult for drsg chg. Arrived to room. Patient sitting up at the edge of bed throwing up. Notified nurse to reconsult when patient lying down and available for PICC line drsg change and evaluation. Tomasita Morrow, RN VAST

## 2023-08-16 NOTE — Telephone Encounter (Signed)
 Called pt to confirm appt. VM left for pt.

## 2023-08-16 NOTE — Progress Notes (Signed)
 PROGRESS NOTE        PATIENT DETAILS Name: [name] Age: 53 y.o. Sex: male Date of Birth: Jun 22, 1970 Admit Date: 08/10/2023 Admitting Physician Lorin Glass, MD SWN:[id number], Kinnie Scales, NP  Brief Summary: Patient is a 53 y.o.  male EtOH use, chronic pancreatitis, polysubstance abuse, PAF, WPW syndrome s/p ablation-who presented with abdominal pain, vomiting/diarrhea-patient was found to have AKI/metabolic acidosis/hyperkalemia and subsequent admitted to the hospitalist service.  See below for further details  Significant events: 4/3>> admit to [facility]  Significant studies: 4/6>> CT abdomen/pelvis: Chronic pancreatitis with pseudocyst, gastritis.  Significant microbiology data: None  Procedures: None  Consults: Nephrology PCCM  Subjective: Continues to complain of abdominal pain but appears comfortable.  Objective: Vitals: Blood pressure (!) 124/94, pulse 100, temperature 98.1 F (36.7 C), temperature source Oral, resp. rate 20, height 5\' 8"  (1.727 m), weight 56.7 kg, SpO2 100%.   Exam: Gen Exam:Alert awake-not in any distress HEENT:atraumatic, normocephalic Chest: B/L clear to auscultation anteriorly CVS:S1S2 regular Abdomen: soft-mildly tender in the epigastric area. Extremities:no edema Neurology: Non focal Skin: no rash  Pertinent Labs/Radiology:    Latest Ref Rng & Units 08/16/2023    3:00 AM 08/15/2023    3:07 AM 08/14/2023    3:41 AM  CBC  WBC 4.0 - 10.5 K/uL 10.9  9.4  11.1   Hemoglobin 13.0 - 17.0 g/dL 00.9  38.1  9.6   Hematocrit 39.0 - 52.0 % 32.3  32.1  29.7   Platelets 150 - 400 K/uL 150  129  117     Lab Results  Component Value Date   NA 136 08/16/2023   K 4.8 08/16/2023   CL 102 08/16/2023   CO2 24 08/16/2023      Assessment/Plan: Hyperkalemia Unclear etiology-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 Managed with supportive care including IVF/Lokelma Potassium levels have normalized Nephrology  continues to follow. Follow electrolytes.  Severe hypomagnesemia Replete-recheck later today. Unclear whether this is malabsorption from chronic pancreatitis or some type of RTA  None anion gap metabolic acidosis Secondary to diarrhea Improved Follow electrolytes  Acute urinary retention Flomax-voiding trial today-Foley removed  Intractable nausea/vomiting secondary to acute on chronic pancreatitis and gastritis Continues to have some abdominal pain-tolerating advancement in diet Continue Creon Continue PT/Carafate As needed narcotics.  History of PAF History of WPW syndrome s/p ablation Telemetry monitoring Metoprolol  HTN BP stable Metoprolol  Tobacco abuse Transdermal nicotine  EtOH use Polysubstance abuse UDS on 3/21 positive for benzo/cocaine/THC No signs of alcohol withdrawal  Code status:   Code Status: Full Code   DVT Prophylaxis: Place and maintain sequential compression device Start: 08/13/23 0759   Family Communication: None at bedside   Disposition Plan: Status is: Inpatient Remains inpatient appropriate because: Severity of illness   Planned Discharge Destination:Home   Diet: Diet Order             Diet renal with fluid restriction Room service appropriate? Yes; Fluid consistency: Thin  Diet effective now                     Antimicrobial agents: Anti-infectives (From admission, onward)    None        MEDICATIONS: Scheduled Meds:  Chlorhexidine Gluconate Cloth  6 each Topical Daily   folic acid  1 mg Oral Daily   lipase/protease/amylase  72,000 Units  Oral TID WC   metoprolol succinate  200 mg Oral Daily   multivitamin with minerals  1 tablet Oral Daily   nicotine  21 mg Transdermal Daily   pantoprazole (PROTONIX) IV  40 mg Intravenous Q12H   sodium chloride flush  10-40 mL Intracatheter Q12H   sucralfate  1 g Oral TID WC & HS   tamsulosin  0.4 mg Oral Daily   Continuous Infusions:  thiamine (VITAMIN B1) injection  Stopped (08/16/23 1030)   PRN Meds:.acetaminophen **OR** acetaminophen, albuterol, ALPRAZolam, bisacodyl, hydrALAZINE, lipase/protease/amylase **AND** lipase/protease/amylase, morphine injection, ondansetron (ZOFRAN) IV, mouth rinse, oxyCODONE, polyethylene glycol, sodium chloride flush   I have personally reviewed following labs and imaging studies  LABORATORY DATA: CBC: Recent Labs  Lab 08/12/23 0237 08/13/23 0440 08/14/23 0341 08/15/23 0307 08/16/23 0300  WBC 8.4 7.8 11.1* 9.4 10.9*  HGB 8.0* 7.0* 9.6* 10.1* 10.1*  HCT 26.5* 22.8* 29.7* 32.1* 32.3*  MCV 88.3 89.1 85.8 86.5 87.5  PLT 143* 119* 117* 129* 150    Basic Metabolic Panel: Recent Labs  Lab 08/12/23 0237 08/12/23 0905 08/12/23 1604 08/13/23 0440 08/13/23 1001 08/14/23 0341 08/14/23 1257 08/14/23 2321 08/15/23 0307 08/16/23 0300  NA 138  --  138 136   < > 136 135 133* 137 136  K 3.9   < > 4.5 5.7*   < > 5.8* 5.9* 4.3 5.3* 4.8  CL 115*  --  113* 111   < > 108 103 101 104 102  CO2 16*  --  18* 19*   < > 20* 24 23 24 24   GLUCOSE 82  --  126* 87   < > 86 106* 129* 52* 80  BUN 6  --  <5* 7   < > 7 6 8 8 9   CREATININE 0.72  --  0.91 0.84   < > 0.83 0.84 1.00 0.77 1.06  CALCIUM 8.3*  --  6.9* 7.1*   < > 7.2* 8.5* 8.7* 8.6* 8.4*  MG 2.0  --  1.2* 1.3*  --  1.0* 1.6*  --  1.2* 0.9*  PHOS 3.3  --  3.6 2.3*  --  3.2  --   --  3.5  --    < > = values in this interval not displayed.    GFR: Estimated Creatinine Clearance: 63.9 mL/min (by C-G formula based on SCr of 1.06 mg/dL).  Liver Function Tests: Recent Labs  Lab 08/10/23 1129 08/13/23 1001  AST 53* 77*  ALT 29 33  ALKPHOS 108 83  BILITOT 1.6* 0.5  PROT 8.6* 6.9  ALBUMIN 4.2 3.4*   Recent Labs  Lab 08/10/23 1129 08/11/23 1546  LIPASE 21 21   No results for input(s): "AMMONIA" in the last 168 hours.  Coagulation Profile: Recent Labs  Lab 08/11/23 1546  INR 1.2    Cardiac Enzymes: Recent Labs  Lab 08/11/23 1546  CKTOTAL 514*    BNP  (last 3 results) No results for input(s): "PROBNP" in the last 8760 hours.  Lipid Profile: No results for input(s): "CHOL", "HDL", "LDLCALC", "TRIG", "CHOLHDL", "LDLDIRECT" in the last 72 hours.  Thyroid Function Tests: No results for input(s): "TSH", "T4TOTAL", "FREET4", "T3FREE", "THYROIDAB" in the last 72 hours.  Anemia Panel: No results for input(s): "VITAMINB12", "FOLATE", "FERRITIN", "TIBC", "IRON", "RETICCTPCT" in the last 72 hours.  Urine analysis:    Component Value Date/Time   COLORURINE COLORLESS (A) 08/11/2023 1900   APPEARANCEUR CLEAR 08/11/2023 1900   LABSPEC 1.004 (L) 08/11/2023 1900   PHURINE  5.0 08/11/2023 1900   GLUCOSEU NEGATIVE 08/11/2023 1900   HGBUR NEGATIVE 08/11/2023 1900   HGBUR negative 04/30/2010 1020   BILIRUBINUR NEGATIVE 08/11/2023 1900   BILIRUBINUR negative 09/06/2019 1649   KETONESUR NEGATIVE 08/11/2023 1900   PROTEINUR NEGATIVE 08/11/2023 1900   UROBILINOGEN 0.2 09/06/2019 1649   UROBILINOGEN 0.2 03/15/2015 0508   NITRITE NEGATIVE 08/11/2023 1900   LEUKOCYTESUR NEGATIVE 08/11/2023 1900    Sepsis Labs: Lactic Acid, Venous    Component Value Date/Time   LATICACIDVEN 1.0 08/11/2023 1546    MICROBIOLOGY: Recent Results (from the past 240 hours)  MRSA Next Gen by PCR, Nasal     Status: None   Collection Time: 08/11/23  8:02 PM   Specimen: Nasal Mucosa; Nasal Swab  Result Value Ref Range Status   MRSA by PCR Next Gen NOT DETECTED NOT DETECTED Final    Comment: (NOTE) The GeneXpert MRSA Assay (FDA approved for NASAL specimens only), is one component of a comprehensive MRSA colonization surveillance program. It is not intended to diagnose MRSA infection nor to guide or monitor treatment for MRSA infections. Test performance is not FDA approved in patients less than 8 years old. Performed at [facility], [address] [place], [place] [address]     RADIOLOGY STUDIES/RESULTS: No results found.   LOS: 6 days   Jeoffrey Massed, MD  [facility]    To contact the attending provider between 7A-7P or the covering provider during after hours 7P-7A, please log into the web site [email or web address] and access using universal [place] password for that web site. If you do not have the password, please call the hospital operator.  08/16/2023, 2:21 PM

## 2023-08-16 NOTE — Plan of Care (Signed)
 Pt has rested quietly throughout the night with no distress noted. Alert and oriented. On room air. SR on the monitor. Up ad lib to BR, in room and down to kitchen to get coffee. Eating and drinking with no difficulty. Medicated alternatively with morphine and oxy. Had to call MD d/t pt having a lot of abdominal pain and got a one time order for morphine. Pt stating this regimen is not working for him. No other complaints voiced.     Problem: Education: Goal: Knowledge of General Education information will improve Description: Including pain rating scale, medication(s)/side effects and non-pharmacologic comfort measures Outcome: Progressing   Problem: Health Behavior/Discharge Planning: Goal: Ability to manage health-related needs will improve Outcome: Progressing   Problem: Clinical Measurements: Goal: Respiratory complications will improve Outcome: Progressing Goal: Cardiovascular complication will be avoided Outcome: Progressing   Problem: Activity: Goal: Risk for activity intolerance will decrease Outcome: Progressing   Problem: Coping: Goal: Level of anxiety will decrease Outcome: Progressing   Problem: Pain Managment: Goal: General experience of comfort will improve and/or be controlled Outcome: Progressing

## 2023-08-17 DIAGNOSIS — I10 Essential (primary) hypertension: Secondary | ICD-10-CM | POA: Diagnosis not present

## 2023-08-17 DIAGNOSIS — R197 Diarrhea, unspecified: Secondary | ICD-10-CM | POA: Diagnosis not present

## 2023-08-17 DIAGNOSIS — E875 Hyperkalemia: Secondary | ICD-10-CM | POA: Diagnosis not present

## 2023-08-17 LAB — BASIC METABOLIC PANEL WITH GFR
Anion gap: 7 (ref 5–15)
BUN: 9 mg/dL (ref 6–20)
CO2: 22 mmol/L (ref 22–32)
Calcium: 8.9 mg/dL (ref 8.9–10.3)
Chloride: 107 mmol/L (ref 98–111)
Creatinine, Ser: 0.98 mg/dL (ref 0.61–1.24)
GFR, Estimated: >60 mL/min (ref >60)
Glucose, Bld: 167 mg/dL — ABNORMAL HIGH (ref 70–99)
Potassium: 5.5 mmol/L — ABNORMAL HIGH (ref 3.5–5.1)
Sodium: 136 mmol/L (ref 135–145)

## 2023-08-17 LAB — PHOSPHORUS: Phosphorus: 2.8 mg/dL (ref 2.5–4.6)

## 2023-08-17 LAB — MAGNESIUM: Magnesium: 1.8 mg/dL (ref 1.7–2.4)

## 2023-08-17 MED ORDER — SODIUM ZIRCONIUM CYCLOSILICATE 10 G PO PACK
10.0000 g | PACK | Freq: Every day | ORAL | Status: DC
  Administered 2023-08-17: 10 g via ORAL
  Filled 2023-08-17: qty 1

## 2023-08-17 MED ORDER — MAGNESIUM SULFATE 2 GM/50ML IV SOLN
2.0000 g | Freq: Once | INTRAVENOUS | Status: AC
  Administered 2023-08-17: 2 g via INTRAVENOUS
  Filled 2023-08-17: qty 50

## 2023-08-17 MED ORDER — MAGNESIUM OXIDE -MG SUPPLEMENT 400 (240 MG) MG PO TABS
400.0000 mg | ORAL_TABLET | Freq: Two times a day (BID) | ORAL | Status: DC
Start: 2023-08-17 — End: 2023-08-18
  Administered 2023-08-17 (×2): 400 mg via ORAL
  Filled 2023-08-17 (×2): qty 1

## 2023-08-17 NOTE — Progress Notes (Signed)
 PROGRESS NOTE        PATIENT DETAILS Name: [name] Age: 53 y.o. Sex: male Date of Birth: Nov 17, 1970 Admit Date: 08/10/2023 Admitting Physician Lorin Glass, MD ZOX:[id number], Kinnie Scales, NP  Brief Summary: Patient is a 53 y.o.  male EtOH use, chronic pancreatitis, polysubstance abuse, PAF, WPW syndrome s/p ablation-who presented with abdominal pain, vomiting/diarrhea-patient was found to have AKI/metabolic acidosis/hyperkalemia and subsequent admitted to the hospitalist service.  See below for further details  Significant events: 4/3>> admit to [facility]  Significant studies: 4/6>> CT abdomen/pelvis: Chronic pancreatitis with pseudocyst, gastritis.  Significant microbiology data: None  Procedures: None  Consults: Nephrology PCCM  Subjective: Abdominal pain slowly improving-close to baseline.  Asking for diet to be switched to a regular diet.  He does not want a heart healthy diet.  Objective: Vitals: Blood pressure (!) 142/92, pulse 74, temperature 98 F (36.7 C), temperature source Oral, resp. rate 16, height 5\' 8"  (1.727 m), weight 56.7 kg, SpO2 98%.   Exam: Gen Exam:Alert awake-not in any distress HEENT:atraumatic, normocephalic Chest: B/L clear to auscultation anteriorly CVS:S1S2 regular Abdomen: soft-mildly tender in the epigastric area. Extremities:no edema Neurology: Non focal Skin: no rash  Pertinent Labs/Radiology:    Latest Ref Rng & Units 08/16/2023    3:00 AM 08/15/2023    3:07 AM 08/14/2023    3:41 AM  CBC  WBC 4.0 - 10.5 K/uL 10.9  9.4  11.1   Hemoglobin 13.0 - 17.0 g/dL 09.8  11.9  9.6   Hematocrit 39.0 - 52.0 % 32.3  32.1  29.7   Platelets 150 - 400 K/uL 150  129  117     Lab Results  Component Value Date   NA 136 08/17/2023   K 5.5 (H) 08/17/2023   CL 107 08/17/2023   CO2 22 08/17/2023      Assessment/Plan: Hyperkalemia Unclear etiology-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 Management supportive  care including IVF/Lokelma-potassium levels increasing again Repeat [facility] today Nephrology continues to follow   Severe hypomagnesemia Better today-give another dose of IV magnesium-then placed on oral magnesium supplementation. Unclear whether this is malabsorption from chronic pancreatitis or some type of RTA  None anion gap metabolic acidosis Secondary to diarrhea Improved Follow electrolytes  Acute urinary retention Flomax-voiding trial today-Foley removed  Intractable nausea/vomiting secondary to acute on chronic pancreatitis and gastritis Continues to have some abdominal pain-tolerating advancement in diet Continue Creon Continue PPI/Carafate. As needed narcotics. Per patient-he has been referred to outpatient pain management.  History of PAF History of WPW syndrome s/p ablation Telemetry monitoring Metoprolol  HTN BP stable Metoprolol  Tobacco abuse Transdermal nicotine  EtOH use Polysubstance abuse UDS on 3/21 positive for benzo/cocaine/THC No signs of alcohol withdrawal  Code status:   Code Status: Full Code   DVT Prophylaxis: Place and maintain sequential compression device Start: 08/13/23 0759   Family Communication: None at bedside   Disposition Plan: Status is: Inpatient Remains inpatient appropriate because: Severity of illness   Planned Discharge Destination:Home   Diet: Diet Order             Diet renal with fluid restriction Room service appropriate? Yes; Fluid consistency: Thin  Diet effective now                     Antimicrobial agents: Anti-infectives (From admission, onward)  None        MEDICATIONS: Scheduled Meds:  Chlorhexidine Gluconate Cloth  6 each Topical Daily   folic acid  1 mg Oral Daily   lipase/protease/amylase  72,000 Units Oral TID WC   magnesium oxide  400 mg Oral BID   metoprolol succinate  200 mg Oral Daily   multivitamin with minerals  1 tablet Oral Daily   nicotine  21 mg Transdermal  Daily   pantoprazole (PROTONIX) IV  40 mg Intravenous Q12H   sodium chloride flush  10-40 mL Intracatheter Q12H   sucralfate  1 g Oral TID WC & HS   tamsulosin  0.4 mg Oral Daily   Continuous Infusions:  thiamine (VITAMIN B1) injection 500 mg (08/17/23 0914)   PRN Meds:.acetaminophen **OR** acetaminophen, albuterol, ALPRAZolam, bisacodyl, hydrALAZINE, lipase/protease/amylase **AND** lipase/protease/amylase, morphine injection, ondansetron (ZOFRAN) IV, mouth rinse, oxyCODONE, polyethylene glycol, sodium chloride flush   I have personally reviewed following labs and imaging studies  LABORATORY DATA: CBC: Recent Labs  Lab 08/12/23 0237 08/13/23 0440 08/14/23 0341 08/15/23 0307 08/16/23 0300  WBC 8.4 7.8 11.1* 9.4 10.9*  HGB 8.0* 7.0* 9.6* 10.1* 10.1*  HCT 26.5* 22.8* 29.7* 32.1* 32.3*  MCV 88.3 89.1 85.8 86.5 87.5  PLT 143* 119* 117* 129* 150    Basic Metabolic Panel: Recent Labs  Lab 08/12/23 1604 08/13/23 0440 08/13/23 1001 08/14/23 0341 08/14/23 1257 08/14/23 2321 08/15/23 0307 08/16/23 0300 08/16/23 1636 08/17/23 0327  NA 138 136   < > 136 135 133* 137 136  --  136  K 4.5 5.7*   < > 5.8* 5.9* 4.3 5.3* 4.8  --  5.5*  CL 113* 111   < > 108 103 101 104 102  --  107  CO2 18* 19*   < > 20* 24 23 24 24   --  22  GLUCOSE 126* 87   < > 86 106* 129* 52* 80  --  167*  BUN <5* 7   < > 7 6 8 8 9   --  9  CREATININE 0.91 0.84   < > 0.83 0.84 1.00 0.77 1.06  --  0.98  CALCIUM 6.9* 7.1*   < > 7.2* 8.5* 8.7* 8.6* 8.4*  --  8.9  MG 1.2* 1.3*  --  1.0* 1.6*  --  1.2* 0.9* 1.5* 1.8  PHOS 3.6 2.3*  --  3.2  --   --  3.5  --   --  2.8   < > = values in this interval not displayed.    GFR: Estimated Creatinine Clearance: 69.1 mL/min (by C-G formula based on SCr of 0.98 mg/dL).  Liver Function Tests: Recent Labs  Lab 08/10/23 1129 08/13/23 1001  AST 53* 77*  ALT 29 33  ALKPHOS 108 83  BILITOT 1.6* 0.5  PROT 8.6* 6.9  ALBUMIN 4.2 3.4*   Recent Labs  Lab 08/10/23 1129  08/11/23 1546  LIPASE 21 21   No results for input(s): "AMMONIA" in the last 168 hours.  Coagulation Profile: Recent Labs  Lab 08/11/23 1546  INR 1.2    Cardiac Enzymes: Recent Labs  Lab 08/11/23 1546  CKTOTAL 514*    BNP (last 3 results) No results for input(s): "PROBNP" in the last 8760 hours.  Lipid Profile: No results for input(s): "CHOL", "HDL", "LDLCALC", "TRIG", "CHOLHDL", "LDLDIRECT" in the last 72 hours.  Thyroid Function Tests: No results for input(s): "TSH", "T4TOTAL", "FREET4", "T3FREE", "THYROIDAB" in the last 72 hours.  Anemia Panel: No results for input(s): "VITAMINB12", "  FOLATE", "FERRITIN", "TIBC", "IRON", "RETICCTPCT" in the last 72 hours.  Urine analysis:    Component Value Date/Time   COLORURINE COLORLESS (A) 08/11/2023 1900   APPEARANCEUR CLEAR 08/11/2023 1900   LABSPEC 1.004 (L) 08/11/2023 1900   PHURINE 5.0 08/11/2023 1900   GLUCOSEU NEGATIVE 08/11/2023 1900   HGBUR NEGATIVE 08/11/2023 1900   HGBUR negative 04/30/2010 1020   BILIRUBINUR NEGATIVE 08/11/2023 1900   BILIRUBINUR negative 09/06/2019 1649   KETONESUR NEGATIVE 08/11/2023 1900   PROTEINUR NEGATIVE 08/11/2023 1900   UROBILINOGEN 0.2 09/06/2019 1649   UROBILINOGEN 0.2 03/15/2015 0508   NITRITE NEGATIVE 08/11/2023 1900   LEUKOCYTESUR NEGATIVE 08/11/2023 1900    Sepsis Labs: Lactic Acid, Venous    Component Value Date/Time   LATICACIDVEN 1.0 08/11/2023 1546    MICROBIOLOGY: Recent Results (from the past 240 hours)  MRSA Next Gen by PCR, Nasal     Status: None   Collection Time: 08/11/23  8:02 PM   Specimen: Nasal Mucosa; Nasal Swab  Result Value Ref Range Status   MRSA by PCR Next Gen NOT DETECTED NOT DETECTED Final    Comment: (NOTE) The GeneXpert MRSA Assay (FDA approved for NASAL specimens only), is one component of a comprehensive MRSA colonization surveillance program. It is not intended to diagnose MRSA infection nor to guide or monitor treatment for MRSA  infections. Test performance is not FDA approved in patients less than 9 years old. Performed at [facility], [address], [place], [place] [address]     RADIOLOGY STUDIES/RESULTS: No results found.   LOS: 7 days   Jeoffrey Massed, MD  [facility]    To contact the attending provider between 7A-7P or the covering provider during after hours 7P-7A, please log into the web site [email or web address] and access using universal [place] password for that web site. If you do not have the password, please call the hospital operator.  08/17/2023, 11:29 AM

## 2023-08-17 NOTE — TOC Progression Note (Signed)
 Transition of Care [facility]) - Progression Note    Patient Details  Name: [name] MRN: [id number] Date of Birth: Jul 16, 1970  Transition of [facility]) CM/SW Contact  Taina Landry Reeves Forth, Student-Social Work Phone Number: 08/17/2023, 11:44 AM  Clinical Narrative:    MSW Intern completed ETOH consult weith Pt. Pt asked for alcohol resources as well as resources for food banks, mental health, and transportation. Pt stated he lives at home with his mother and does not currently have a PCP but would like to be set up with one. MSW Intern provided pt with alcohol resources and will add the others to his AVS.         Expected Discharge Plan and Services                                               Social Determinants of Health (SDOH) Interventions SDOH Screenings   Food Insecurity: No Food Insecurity (08/10/2023)  Housing: Low Risk  (08/10/2023)  Transportation Needs: No Transportation Needs (08/10/2023)  Utilities: Not At Risk (08/10/2023)  Alcohol Screen: Medium Risk (03/14/2017)  Depression (PHQ2-9): Medium Risk (03/27/2020)  Tobacco Use: High Risk (08/10/2023)    Readmission Risk Interventions    06/19/2023    4:11 PM 03/07/2023    4:21 PM 01/10/2023    4:32 PM  Readmission Risk Prevention Plan  Transportation Screening Complete Complete Complete  Medication Review (RN Care Manager) Referral to Pharmacy Complete Complete  PCP or Specialist appointment within 3-5 days of discharge Complete Complete Complete  HRI or Home Care Consult Complete Complete Complete  SW Recovery Care/Counseling Consult Complete Complete Complete  Palliative Care Screening Not Applicable Not Applicable Not Applicable  Skilled Nursing Facility Not Applicable Not Applicable Not Applicable

## 2023-08-17 NOTE — Discharge Instructions (Signed)
 [employer] assistance programs Crisis assistance programs  -[facility] Arts development officer. If you are experiencing homelessness in [place], [place], your first point of contact should be Pensions consultant. You can reach Coordinated Entry by calling (336) ([phone] or by emailing [email or web address] [email or web address].  Community access points: [employer] [phone], [place]) every Tuesday from 9am-10am. [facility] ([address] [place], [place]) every Wednesday from 8am-9am.   -[place] Coordinated Re-entry Marcy Panning: Dial 211 and request. Offers referrals to homeless shelters in the area.    -The [employer] 705-857-6775) offers several services to local families, as funding allows. The Emergency Assistance Program (EAP), which they administer, provides household goods, free food, clothing, and financial aid to people in need in the [employer] area. The EAP program does have some qualification, and counselors will interview clients for financial assistance by written referral only. Referrals need to be made by the Department of Social Services or by other EAP approved human services agencies or charities in the area.  -[facility] of [employer], which can be reached at [phone], offers emergency assistance programs for those in need of help, such as food, rent assistance, a soup kitchen, shelter, and clothing. They are based in [facility] but provide a number of services to those that qualify for assistance.   [facility] Department of Social Services may be able to offer temporary financial assistance and cash grants for paying rent and utilities, Help may be provided for local county residents who may be experiencing personal crisis when other resources, including government programs, are not available. Call [phone]  -High [employer] is a [employer] agency, The organization can offer emergency assistance for paying rent, [employer] Rx, utilities, food, household products and furniture. They offer extensive emergency and transitional housing for families, children and single women, and also run a Boy's and [employer]. Thrift Shops, Secondary school teacher, and other aid offered too. [address] [place], [place] [address], [phone]  -[place] Low Income Energy Assistance Program -- This is offered for [facility] families. The federal government created [employer] Program provides a one-time cash grant payment to help eligible low-income families pay their electric and heating bills. [address] [place], [place] [address], [phone]  -[place] Emergency Assistance -- A program offers emergency utility and rent funds for greater [employer] area residents. The program can also provide counseling and referrals to charities and government programs. Also provides food and a free meal program that serves lunch Mondays - Saturdays and dinner seven days per week to individuals in the community. [address], [place], [place] [address], [phone]  -[employer] - Offers affordable apartment and housing communities across      [place] and [place]. The low income and seniors can access public housing, rental assistance to qualified applicants, and apply for the section 8 rent subsidy program. Other programs include Chiropractor and Engineer, maintenance. [address] [place], [place] [address], dial [phone].  -The [facility] provides transitional housing to veterans and the disabled. Clients will also access other services too, including assistance in applying for Disability, life skills classes, case management, and assistance in finding permanent housing. [address] [place], [place]  [place] [address], call [phone]  -[place] Transitional Housing through [facility] is for people who were just  evicted or that are formerly homeless. The non-profit will also help then gain self-sufficiency, find a home or apartment to live in, and also provides information on rent assistance when needed. Phone [phone]  -The [place] [facility] [facility] helps low income, elderly, or disabled residents in seven counties in the [place] ([place], [place], [place], [place], [place], [place], [place], and [place]) save energy and reduce their utility bills by improving energy efficiency. Phone [phone].  -[employer] is located in the [place] [facility] in the [employer], [address], [place], [place] [address]. Parking is in the rear of the building. Phone: [phone]   General Email: info@gsohc .org  GHC provides free housing counseling assistance in locating affordable rental housing or housing with support services for families and individuals in crisis and the chronically homeless. We provide potential resources for other housing needs like utilities. Our trained counselors also work with clients on budgeting and financial literacy in effort to empower them to take control of their financial situations. [employer] collaborates with homeless service providers and other stakeholders as part of the [employer] COC (Continuum of Care). The (COC) is a regional/local planning body that coordinates housing and services funding for homeless families and individuals. The role of [facility] in the COC is through housing counseling to work with people we serve on diversion strategies for those that are at imminent risk of becoming homeless. We also work with the Coordinated Assessment/Entry Specialist who attempts to find temporary solutions and/or connects the people  to Housing First, Rapid Re-housing or transitional housing programs. Our Homelessness Prevention Housing Counselors meet with clients on business days (Monday-Fridays, except scheduled holidays) from 8:30 am to 4:30 pm.  Legal assistance for evictions, foreclosure, and more -If you need free legal advice on civil issues, such as foreclosures, evictions, Electronics engineer, government programs, domestic issues and more, Armed forces operational officer Aid of [facility]) is a Associate Professor firm that provides free legal services and counsel to lower income people, seniors, disabled, and others, The goal is to ensure everyone has access to justice and fair representation. Call them at [phone].  [facility] for Housing and Community Studies can provide info about obtaining legal assistance with evictions. Phone [phone].  Data processing manager  The [employer], [employer]. offers job and Dispensing optician. Resources are focused on helping students obtain the skills and experiences that are necessary to compete in today's challenging and tight job market. The non-profit faith-based community action agency offers internship trainings as well as classroom instruction. Classes are tailored to meet the needs of people in the [facility] region. [place], [place] [address], [phone]  Foreclosure prevention/Debt Services Family Services of the [employer] Credit Counseling Service inludes debt and foreclosure prevention programs for local families. This includes money management, financial advice, budget review and development of a written action plan with a Pensions consultant to help solve specific individual financial problems. In addition, housing and mortgage counselors can also provide pre- and post-purchase homeownership counseling, default resolution counseling (to prevent foreclosure) and reverse mortgage counseling. A Debt Management Program allows  people and families with a high level of credit card or medical debt to consolidate and repay consumer debt and loans to creditors and rebuild positive credit ratings and scores. Contact (336) [phone].  Community clinics in [place] -Health Department [facility]: [address] [place], [place], [address]. [phone].  -Health Department [place] [facility]: [phone], [facility] [place], [address]. [phone].  -[facility] offers medical care through a group of doctors, pharmacies and other healthcare related agencies that offer services for low income, uninsured adults in [place]. Also offers adult Dental care and assistance with applying for an [employer]. Call [phone].   [facility]. This center provides low-cost health care to those without health insurance. Services offered include an onsite pharmacy. Phone [phone]. [address], [address], [place].  -Medication Assistance Program serves as a link between pharmaceutical companies and patients to provide low cost or free prescription medications. This service is available for residents who meet certain income restrictions and have no insurance coverage. PLEASE CALL [phone] Ginette Otto) OR [phone] ([place])  -One Step Further: Materials engineer, The [employer] Support & Nutrition Program, [employer]. Call [phone]/ [phone].  Food pantry and assistance -[facility]Food Bank: [address], [place] [address]. Phone [phone]  -[facility] Food Pantry: [address] [place], [place] [address]. [phone].  -Missionary Ministry: has the purpose of visiting the sick and shut-ins and provide for needs in the surrounding communities. Call [phone]. Email: [email or web address] [email or web address] This program provides: Food box for seniors, Financial assistance, Food to meet basic  nutritional needs.  -Meals on Wheels with Senior Resources: [facility] residents age 77 and over who are homebound and unable to obtain and prepare a nutritious meal for themselves are eligible for this service. There may be a waiting list in certain parts of [facility] if the route in that area is full. If you are in [facility] and [place] call [phone] to register. For all other areas call [phone] to register.  -Greater Dietitian: https://findfood.BargainContractor.[email or web address]  TRANSPORTATION: -[employer] Department of Health: Call [facility] and [employer] at [phone] for details. AttractionGuides.es  -Access [place]: Access [place] is the [employer] Agency's shared-ride transportation service for eligible riders who have a disability that prevents them from riding the fixed route bus. Call [phone]. Access [place] riders must pay a fare of $1.50 per trip, or may purchase a 10-ride punch card for $14.00 ($1.40 per ride) or a 40-ride punch card for $48.00 ($1.20 per ride).  -The [facility]'s WHEELS rideshare transportation service is provided for senior citizens (60+) who live independently within [place] city limits and are unable to drive or have limited access to transportation. Call [phone] to schedule an appointment.  -[place] Transportation: For Medicare or Medicaid recipients call [phone]?[name], wheelchair Zenaida Niece, and ambulatory quotes available.   FLEEING VIOLENCE: -Family Services of the [place]- 24/7 Crisis line ([phone]) -[facility]: (336) 641-SAFE [phone])  [place] 2-1-1 is another useful way to locate resources in the community. Visit ShedSizes.ch to find service information online. If you need additional assistance, 2-1-1 Referral Specialists are available 24 hours a day, every day by dialing  2-1-1 or [phone] from any phone. The call is free, confidential, and available in any language.  Affordable Housing Search http://www.nchousingsearch.org  [facility])   M-F 8a-3p [address] [place], [place] [address] [phone] Services include: laundry, barbering, support groups, case management, phone & computer access, showers, AA/NA mtgs, mental health/substance abuse nurse, job skills class, disability information, VA assistance, spiritual classes, etc. Winter Shelter available when temperatures are less than 32 degrees.   HOMELESS SHELTERS [facility] at [facility]- Call [phone] ext. [phone]  or ext. [phone]. Located at [address], [place], [place] [address]  [facility] Mens Shelter- Call [phone]. Located at [address] [place] [address].  [facility]- [employer]. Call [phone]. Office located at [address] [place] [address].  [facility] Family Housing through [place] [phone].  [facility]- Call [phone]. Located at [address] [place], [place], [place] [address].  [facility]-For Pregnant mothers. Call [phone]. Located at [address] [place], [address].  [facility]-For men in [place]. Call [phone]. [facility]ter in [place]. Call [phone].  Home of [employer] for [employer] [phone]. Office located at [address] [place], [address].  [employer] be agreeable to help with chores. Call [phone] ext. [phone].  Men's: [address] [place],  [address]. Women's: [facility]  [address] [place], [place] [address]  Crisis Services [facility] Mobile Crisis Management- [phone]  [facility] [address], [place], [place] [address]. Phone: [phone]

## 2023-08-17 NOTE — Progress Notes (Signed)
  [facility] Progress Note   Subjective:  Foley removed, no bladder scan after- have reordered.  K up slightly again- will give one-time Lokelma.    Objective Vitals:   08/16/23 1807 08/16/23 2000 08/17/23 0124 08/17/23 0400  BP: (!) 145/102 120/78 (!) 142/92   Pulse: 77 73 74   Resp: 14 16 18 16   Temp: 98.5 F (36.9 C) 98.2 F (36.8 C) 98 F (36.7 C)   TempSrc: Oral Oral Oral   SpO2: 96% 99% 98% 98%  Weight:      Height:       Physical Exam General: chronically ill appearing but nontoxic Heart:RRR Lungs: clear Abdomen: soft, mildly TTP  Extremities: no edema  Additional Objective Labs: Basic Metabolic Panel: Recent Labs  Lab 08/14/23 0341 08/14/23 1257 08/15/23 0307 08/16/23 0300 08/17/23 0327  NA 136   < > 137 136 136  K 5.8*   < > 5.3* 4.8 5.5*  CL 108   < > 104 102 107  CO2 20*   < > 24 24 22   GLUCOSE 86   < > 52* 80 167*  BUN 7   < > 8 9 9   CREATININE 0.83   < > 0.77 1.06 0.98  CALCIUM 7.2*   < > 8.6* 8.4* 8.9  PHOS 3.2  --  3.5  --  2.8   < > = values in this interval not displayed.   Liver Function Tests: Recent Labs  Lab 08/13/23 1001  AST 77*  ALT 33  ALKPHOS 83  BILITOT 0.5  PROT 6.9  ALBUMIN 3.4*   Recent Labs  Lab 08/11/23 1546  LIPASE 21   CBC: Recent Labs  Lab 08/12/23 0237 08/13/23 0440 08/14/23 0341 08/15/23 0307 08/16/23 0300  WBC 8.4 7.8 11.1* 9.4 10.9*  HGB 8.0* 7.0* 9.6* 10.1* 10.1*  HCT 26.5* 22.8* 29.7* 32.1* 32.3*  MCV 88.3 89.1 85.8 86.5 87.5  PLT 143* 119* 117* 129* 150   Blood Culture    Component Value Date/Time   SDES URINE, CLEAN CATCH 11/20/2020 1221   SPECREQUEST NONE 11/20/2020 1221   CULT  11/20/2020 1221    NO GROWTH Performed at [facility], [address] [place], [place] [address]    REPTSTATUS 11/21/2020 FINAL 11/20/2020 1221    Cardiac Enzymes: Recent Labs  Lab 08/11/23 1546  CKTOTAL 514*   CBG: Recent Labs  Lab 08/12/23 0734 08/12/23 1202 08/14/23 0915  08/14/23 1222 08/14/23 1642  GLUCAP 77 121* 192* 128* 209*   Iron Studies:  No results for input(s): "IRON", "TIBC", "TRANSFERRIN", "FERRITIN" in the last 72 hours.  @lablastinr3 @ Studies/Results: No results found.  Medications:  thiamine (VITAMIN B1) injection 500 mg (08/17/23 0914)    Chlorhexidine Gluconate Cloth  6 each Topical Daily   folic acid  1 mg Oral Daily   lipase/protease/amylase  72,000 Units Oral TID WC   magnesium oxide  400 mg Oral BID   metoprolol succinate  200 mg Oral Daily   multivitamin with minerals  1 tablet Oral Daily   nicotine  21 mg Transdermal Daily   pantoprazole (PROTONIX) IV  40 mg Intravenous Q12H   sodium chloride flush  10-40 mL Intracatheter Q12H   sodium zirconium cyclosilicate  10 g Oral Daily   sucralfate  1 g Oral TID WC & HS   tamsulosin  0.4 mg Oral Daily   Assessment/PlanPharoah Edem Tiegs is an 53 y.o. male with alcoholism, chronic pancreatitis, ongoing tobacco use, poly substance abuse (  u tox in past 2 months with benzos, cocaine, THC, opioids), HTN, A fib, WPW s/p ablation, GERD, bipolar d/o currently admitted with abd pain and diarrhea with course complicated by hyperkalemia for which nephrology is consulted.    **Hyperkalemia:  severe but no EKG changes.  In setting of hypovolemia and urinary obstruction.  CK just mildly elevated, no CBC changes to suggest pseudohyperK.   No other signs of poor perfusion, hemolysis or other tissue necrosis as K source. No RAASi.  With restoration of volume and relief of obstruction this quickly normalized but is up again   - continue to monitor - sending A1c WNL -? RTA IV - will remove Foley and do a trial of void- reordered bladder scan - Lokelma today x 1 - nothing else to add- will sign off for now, call with questions    **NAGMA:  recent serum bicarb levels in the 18-20 range acutely worse now in the setting of acute on chronic diarrhea.  Suspect it's all diarrhea related but could have  RTA related to urinary obstruction. Improved s/p bicarb gtt.          **Acute on chronic diarrhea: chronic pancreatitis on creon. per primary   **Malnutrition secondary to malabsorption and poor intake: multiple electrolyte abnormalities including profoundly low phos, low ca, low mag.  Being repleted IV successfully.   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  Will be a chronic issue with ongoing EtOH and chronic pancreatitis.    **Anemia:  mild.    Bufford Buttner MD 08/17/2023, 1:09 PM   [facility] Pager: [phone]

## 2023-08-17 NOTE — Plan of Care (Signed)

## 2023-08-17 NOTE — Plan of Care (Signed)
 Pt has rested quietly throughout the night with no distress noted. Alert and oriented. ON room air. SR on the monitor. Up to BR and in room/hall independently. Medicated for pain 3 times, alternating morphine and oxy as ordered. Dose of morphine given early d/t severe abd pain per MD. No other complaints voiced.     Problem: Education: Goal: Knowledge of General Education information will improve Description: Including pain rating scale, medication(s)/side effects and non-pharmacologic comfort measures Outcome: Progressing   Problem: Health Behavior/Discharge Planning: Goal: Ability to manage health-related needs will improve Outcome: Progressing   Problem: Clinical Measurements: Goal: Cardiovascular complication will be avoided Outcome: Progressing   Problem: Nutrition: Goal: Adequate nutrition will be maintained Outcome: Progressing   Problem: Coping: Goal: Level of anxiety will decrease Outcome: Progressing   Problem: Pain Managment: Goal: General experience of comfort will improve and/or be controlled Outcome: Progressing

## 2023-08-17 NOTE — TOC Progression Note (Signed)
 Transition of Care [facility]) - Progression Note    Patient Details  Name: [name] MRN: [id number] Date of Birth: [birth year]  [facility]) CM/SW Contact  Blessin Kanno Reeves Forth, Student-Social Work Phone Number: 08/17/2023, 8:39 AM  Clinical Narrative:     MSW Intern attempted to complete ETOH consult with pt but pt was asleep upon arrival. Will try again tomorrow.        Expected Discharge Plan and Services                                               Social Determinants of Health (SDOH) Interventions SDOH Screenings   Food Insecurity: No Food Insecurity (08/10/2023)  Housing: Low Risk  (08/10/2023)  Transportation Needs: No Transportation Needs (08/10/2023)  Utilities: Not At Risk (08/10/2023)  Alcohol Screen: Medium Risk (03/14/2017)  Depression (PHQ2-9): Medium Risk (03/27/2020)  Tobacco Use: High Risk (08/10/2023)    Readmission Risk Interventions    06/19/2023    4:11 PM 03/07/2023    4:21 PM 01/10/2023    4:32 PM  Readmission Risk Prevention Plan  Transportation Screening Complete Complete Complete  Medication Review (RN Care Manager) Referral to Pharmacy Complete Complete  PCP or Specialist appointment within 3-5 days of discharge Complete Complete Complete  HRI or Home Care Consult Complete Complete Complete  SW Recovery Care/Counseling Consult Complete Complete Complete  Palliative Care Screening Not Applicable Not Applicable Not Applicable  Skilled Nursing Facility Not Applicable Not Applicable Not Applicable

## 2023-08-18 DIAGNOSIS — N179 Acute kidney failure, unspecified: Secondary | ICD-10-CM | POA: Diagnosis not present

## 2023-08-18 DIAGNOSIS — E875 Hyperkalemia: Secondary | ICD-10-CM | POA: Diagnosis not present

## 2023-08-18 DIAGNOSIS — R197 Diarrhea, unspecified: Secondary | ICD-10-CM | POA: Diagnosis not present

## 2023-08-18 LAB — BASIC METABOLIC PANEL WITH GFR
Anion gap: 11 (ref 5–15)
BUN: 15 mg/dL (ref 6–20)
CO2: 20 mmol/L — ABNORMAL LOW (ref 22–32)
Calcium: 9.1 mg/dL (ref 8.9–10.3)
Chloride: 103 mmol/L (ref 98–111)
Creatinine, Ser: 1.07 mg/dL (ref 0.61–1.24)
GFR, Estimated: >60 mL/min (ref >60)
Glucose, Bld: 214 mg/dL — ABNORMAL HIGH (ref 70–99)
Potassium: 5.2 mmol/L — ABNORMAL HIGH (ref 3.5–5.1)
Sodium: 134 mmol/L — ABNORMAL LOW (ref 135–145)

## 2023-08-18 LAB — MAGNESIUM: Magnesium: 1.1 mg/dL — ABNORMAL LOW (ref 1.7–2.4)

## 2023-08-18 MED ORDER — THIAMINE MONONITRATE 100 MG PO TABS
100.0000 mg | ORAL_TABLET | Freq: Every day | ORAL | Status: DC
  Administered 2023-08-19 – 2023-08-21 (×3): 100 mg via ORAL
  Filled 2023-08-18 (×3): qty 1

## 2023-08-18 MED ORDER — MAGNESIUM SULFATE 4 GM/100ML IV SOLN
4.0000 g | Freq: Once | INTRAVENOUS | Status: AC
  Administered 2023-08-18: 4 g via INTRAVENOUS
  Filled 2023-08-18: qty 100

## 2023-08-18 MED ORDER — MAGNESIUM OXIDE -MG SUPPLEMENT 400 (240 MG) MG PO TABS
800.0000 mg | ORAL_TABLET | Freq: Two times a day (BID) | ORAL | Status: DC
  Administered 2023-08-18: 800 mg via ORAL
  Filled 2023-08-18: qty 2

## 2023-08-18 MED ORDER — SODIUM ZIRCONIUM CYCLOSILICATE 10 G PO PACK
10.0000 g | PACK | Freq: Two times a day (BID) | ORAL | Status: DC
  Administered 2023-08-18 – 2023-08-21 (×7): 10 g via ORAL
  Filled 2023-08-18 (×7): qty 1

## 2023-08-18 MED ORDER — LOPERAMIDE HCL 2 MG PO CAPS
2.0000 mg | ORAL_CAPSULE | ORAL | Status: DC | PRN
  Administered 2023-08-18 – 2023-08-20 (×3): 2 mg via ORAL
  Filled 2023-08-18 (×3): qty 1

## 2023-08-18 MED ORDER — PANTOPRAZOLE SODIUM 40 MG PO TBEC
40.0000 mg | DELAYED_RELEASE_TABLET | Freq: Two times a day (BID) | ORAL | Status: DC
  Administered 2023-08-18 – 2023-08-20 (×4): 40 mg via ORAL
  Filled 2023-08-18 (×3): qty 1
  Filled 2023-08-18: qty 2

## 2023-08-18 NOTE — Plan of Care (Addendum)
 Pt has rested quietly throughout the night with no distress noted. Alert and oriented. On room air. SR on the monitor. Up ad lib in hall/room and to BR. Medicated twice for pain with relief noted. No other complaints voiced.     Problem: Education: Goal: Knowledge of General Education information will improve Description: Including pain rating scale, medication(s)/side effects and non-pharmacologic comfort measures Outcome: Progressing   Problem: Health Behavior/Discharge Planning: Goal: Ability to manage health-related needs will improve Outcome: Progressing   Problem: Clinical Measurements: Goal: Respiratory complications will improve Outcome: Progressing Goal: Cardiovascular complication will be avoided Outcome: Progressing   Problem: Nutrition: Goal: Adequate nutrition will be maintained Outcome: Progressing   Problem: Coping: Goal: Level of anxiety will decrease Outcome: Progressing   Problem: Pain Managment: Goal: General experience of comfort will improve and/or be controlled Outcome: Progressing

## 2023-08-18 NOTE — TOC Progression Note (Signed)
 Transition of Care [facility]) - Progression Note    Patient Details  Name: [name] MRN: [id number] Date of Birth: [birth year]  [facility]) CM/SW Contact  Gordy Clement, RN Phone Number: 08/18/2023, 10:54 AM  Clinical Narrative:   RNCM has requested a PCP be established for this Patient. AVS will be updated when appointment is made. TOC will continue to follow patient for any additional discharge needs             Expected Discharge Plan and Services                                               Social Determinants of Health (SDOH) Interventions SDOH Screenings   Food Insecurity: No Food Insecurity (08/10/2023)  Housing: Low Risk  (08/10/2023)  Transportation Needs: No Transportation Needs (08/10/2023)  Utilities: Not At Risk (08/10/2023)  Alcohol Screen: Medium Risk (03/14/2017)  Depression (PHQ2-9): Medium Risk (03/27/2020)  Tobacco Use: High Risk (08/10/2023)    Readmission Risk Interventions    06/19/2023    4:11 PM 03/07/2023    4:21 PM 01/10/2023    4:32 PM  Readmission Risk Prevention Plan  Transportation Screening Complete Complete Complete  Medication Review (RN Care Manager) Referral to Pharmacy Complete Complete  PCP or Specialist appointment within 3-5 days of discharge Complete Complete Complete  HRI or Home Care Consult Complete Complete Complete  SW Recovery Care/Counseling Consult Complete Complete Complete  Palliative Care Screening Not Applicable Not Applicable Not Applicable  Skilled Nursing Facility Not Applicable Not Applicable Not Applicable

## 2023-08-18 NOTE — Progress Notes (Signed)
 PROGRESS NOTE        PATIENT DETAILS Name: [name] Age: 53 y.o. Sex: male Date of Birth: Feb 16, 1971 Admit Date: 08/10/2023 Admitting Physician Lorin Glass, MD [id number], Kinnie Scales, NP  Brief Summary: Patient is a 53 y.o.  male EtOH use, chronic pancreatitis, polysubstance abuse, PAF, WPW syndrome s/p ablation-who presented with abdominal pain, vomiting/diarrhea-patient was found to have AKI/metabolic acidosis/hyperkalemia and subsequent admitted to the hospitalist service.  See below for further details  Significant events: 4/3>> admit to [facility]  Significant studies: 4/6>> CT abdomen/pelvis: Chronic pancreatitis with pseudocyst, gastritis.  Significant microbiology data: None  Procedures: None  Consults: Nephrology PCCM  Subjective: Abdominal pain is slowly improving-no vomiting.  Some diarrhea  Objective: Vitals: Blood pressure (!) 142/92, pulse 82, temperature 98.2 F (36.8 C), temperature source Oral, resp. rate 16, height 5\' 8"  (1.727 m), weight 56.7 kg, SpO2 99%.   Exam: Gen Exam:Alert awake-not in any distress HEENT:atraumatic, normocephalic Chest: B/L clear to auscultation anteriorly CVS:S1S2 regular Abdomen: Soft-mildly tender in the midepigastric area. 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 Managed with IVF/Lokelma-unfortunately-continues to have some borderline  hyperkalemia Starting scheduled hyperkalemia-suspect will need to be discharged on [facility] Repeat electrolytes tomorrow.  Severe hypomagnesemia Likely due to GI loss from chronic pancreatitis related malabsorption/diarrhea-not sure if this is related subset of RTA as well Magnesium levels again downtrending-will give IV magnesium today and repeat Mg level tomorrow morning.  None anion gap metabolic acidosis Secondary to diarrhea due to chronic pancreatitis. Improved Follow electrolytes  Acute urinary retention Trial successful-no longer with Foley. Continue Flomax.  Intractable nausea/vomiting secondary to acute on chronic pancreatitis and gastritis Continues to have some abdominal pain-tolerating advancement in diet Continue Creon Continue PPI/Carafate. As needed narcotics. Per patient-he has been referred to outpatient pain management.  History of PAF History of WPW syndrome s/p ablation Telemetry monitoring Metoprolol  HTN BP stable Metoprolol  Tobacco abuse Transdermal nicotine  EtOH use Polysubstance abuse UDS on 3/21 positive for benzo/cocaine/THC No signs of alcohol withdrawal  Code status:   Code Status: Full Code   DVT Prophylaxis: Place and maintain sequential compression device Start: 08/13/23 0759   Family Communication: None at bedside   Disposition Plan: Status is: Inpatient Remains inpatient appropriate because: Severity of illness   Planned Discharge Destination:Home   Diet: Diet Order             Diet regular Room service appropriate? Yes; Fluid consistency: Thin; Fluid restriction: 1500 mL Fluid  Diet effective now  Antimicrobial agents: Anti-infectives (From admission, onward)    None        MEDICATIONS: Scheduled Meds:  Chlorhexidine Gluconate Cloth  6 each Topical Daily   folic acid  1 mg Oral Daily   lipase/protease/amylase  72,000 Units Oral TID WC   metoprolol succinate  200 mg Oral Daily    multivitamin with minerals  1 tablet Oral Daily   nicotine  21 mg Transdermal Daily   pantoprazole (PROTONIX) IV  40 mg Intravenous Q12H   sodium zirconium cyclosilicate  10 g Oral BID   sucralfate  1 g Oral TID WC & HS   tamsulosin  0.4 mg Oral Daily   Continuous Infusions:  thiamine (VITAMIN B1) injection 500 mg (08/18/23 0941)   PRN Meds:.acetaminophen **OR** acetaminophen, albuterol, ALPRAZolam, bisacodyl, hydrALAZINE, lipase/protease/amylase **AND** lipase/protease/amylase, loperamide, morphine injection, ondansetron (ZOFRAN) IV, mouth rinse, oxyCODONE, polyethylene glycol   I have personally reviewed following labs and imaging studies  LABORATORY DATA: CBC: Recent Labs  Lab 08/12/23 0237 08/13/23 0440 08/14/23 0341 08/15/23 0307 08/16/23 0300  WBC 8.4 7.8 11.1* 9.4 10.9*  HGB 8.0* 7.0* 9.6* 10.1* 10.1*  HCT 26.5* 22.8* 29.7* 32.1* 32.3*  MCV 88.3 89.1 85.8 86.5 87.5  PLT 143* 119* 117* 129* 150    Basic Metabolic Panel: Recent Labs  Lab 08/12/23 1604 08/13/23 0440 08/13/23 1001 08/14/23 0341 08/14/23 1257 08/14/23 2321 08/15/23 0307 08/16/23 0300 08/16/23 1636 08/17/23 0327 08/18/23 0628  NA 138 136   < > 136   < > 133* 137 136  --  136 134*  K 4.5 5.7*   < > 5.8*   < > 4.3 5.3* 4.8  --  5.5* 5.2*  CL 113* 111   < > 108   < > 101 104 102  --  107 103  CO2 18* 19*   < > 20*   < > 23 24 24   --  22 20*  GLUCOSE 126* 87   < > 86   < > 129* 52* 80  --  167* 214*  BUN <5* 7   < > 7   < > 8 8 9   --  9 15  CREATININE 0.91 0.84   < > 0.83   < > 1.00 0.77 1.06  --  0.98 1.07  CALCIUM 6.9* 7.1*   < > 7.2*   < > 8.7* 8.6* 8.4*  --  8.9 9.1  MG 1.2* 1.3*  --  1.0*   < >  --  1.2* 0.9* 1.5* 1.8 1.1*  PHOS 3.6 2.3*  --  3.2  --   --  3.5  --   --  2.8  --    < > = values in this interval not displayed.    GFR: Estimated Creatinine Clearance: 63.3 mL/min (by C-G formula based on SCr of 1.07 mg/dL).  Liver Function Tests: Recent Labs  Lab 08/13/23 1001  AST  77*  ALT 33  ALKPHOS 83  BILITOT 0.5  PROT 6.9  ALBUMIN 3.4*   Recent Labs  Lab 08/11/23 1546  LIPASE 21   No results for input(s): "AMMONIA" in the last 168 hours.  Coagulation Profile: Recent Labs  Lab 08/11/23 1546  INR 1.2    Cardiac Enzymes: Recent Labs  Lab 08/11/23 1546  CKTOTAL 514*    BNP (last 3 results) No results for input(s): "PROBNP" in the last 8760 hours.  Lipid Profile: No results for input(s): "CHOL", "HDL", "LDLCALC", "TRIG", "CHOLHDL", "LDLDIRECT" in the last  72 hours.  Thyroid Function Tests: No results for input(s): "TSH", "T4TOTAL", "FREET4", "T3FREE", "THYROIDAB" in the last 72 hours.  Anemia Panel: No results for input(s): "VITAMINB12", "FOLATE", "FERRITIN", "TIBC", "IRON", "RETICCTPCT" in the last 72 hours.  Urine analysis:    Component Value Date/Time   COLORURINE COLORLESS (A) 08/11/2023 1900   APPEARANCEUR CLEAR 08/11/2023 1900   LABSPEC 1.004 (L) 08/11/2023 1900   PHURINE 5.0 08/11/2023 1900   GLUCOSEU NEGATIVE 08/11/2023 1900   HGBUR NEGATIVE 08/11/2023 1900   HGBUR negative 04/30/2010 1020   BILIRUBINUR NEGATIVE 08/11/2023 1900   BILIRUBINUR negative 09/06/2019 1649   KETONESUR NEGATIVE 08/11/2023 1900   PROTEINUR NEGATIVE 08/11/2023 1900   UROBILINOGEN 0.2 09/06/2019 1649   UROBILINOGEN 0.2 03/15/2015 0508   NITRITE NEGATIVE 08/11/2023 1900   LEUKOCYTESUR NEGATIVE 08/11/2023 1900    Sepsis Labs: Lactic Acid, Venous    Component Value Date/Time   LATICACIDVEN 1.0 08/11/2023 1546    MICROBIOLOGY: Recent Results (from the past 240 hours)  MRSA Next Gen by PCR, Nasal     Status: None   Collection Time: 08/11/23  8:02 PM   Specimen: Nasal Mucosa; Nasal Swab  Result Value Ref Range Status   MRSA by PCR Next Gen NOT DETECTED NOT DETECTED Final    Comment: (NOTE) The GeneXpert MRSA Assay (FDA approved for NASAL specimens only), is one component of a comprehensive MRSA colonization surveillance program. It is not  intended to diagnose MRSA infection nor to guide or monitor treatment for MRSA infections. Test performance is not FDA approved in patients less than 64 years old. Performed at [facility], [address] [place], [place] [address]     RADIOLOGY STUDIES/RESULTS: No results found.   LOS: 8 days   Jeoffrey Massed, MD  [facility]    To contact the attending provider between 7A-7P or the covering provider during after hours 7P-7A, please log into the web site [email or web address] and access using universal [place] password for that web site. If you do not have the password, please call the hospital operator.  08/18/2023, 12:23 PM

## 2023-08-19 DIAGNOSIS — E875 Hyperkalemia: Secondary | ICD-10-CM | POA: Diagnosis not present

## 2023-08-19 DIAGNOSIS — N179 Acute kidney failure, unspecified: Secondary | ICD-10-CM | POA: Diagnosis not present

## 2023-08-19 DIAGNOSIS — R197 Diarrhea, unspecified: Secondary | ICD-10-CM | POA: Diagnosis not present

## 2023-08-19 LAB — MAGNESIUM
Magnesium: 1.1 mg/dL — ABNORMAL LOW (ref 1.7–2.4)
Magnesium: 2.5 mg/dL — ABNORMAL HIGH (ref 1.7–2.4)

## 2023-08-19 LAB — BASIC METABOLIC PANEL WITH GFR
Anion gap: 14 (ref 5–15)
BUN: 12 mg/dL (ref 6–20)
CO2: 20 mmol/L — ABNORMAL LOW (ref 22–32)
Calcium: 8.8 mg/dL — ABNORMAL LOW (ref 8.9–10.3)
Chloride: 101 mmol/L (ref 98–111)
Creatinine, Ser: 0.89 mg/dL (ref 0.61–1.24)
GFR, Estimated: >60 mL/min (ref >60)
Glucose, Bld: 208 mg/dL — ABNORMAL HIGH (ref 70–99)
Potassium: 4.1 mmol/L (ref 3.5–5.1)
Sodium: 135 mmol/L (ref 135–145)

## 2023-08-19 MED ORDER — MAGNESIUM SULFATE 4 GM/100ML IV SOLN
4.0000 g | Freq: Once | INTRAVENOUS | Status: AC
  Administered 2023-08-19: 4 g via INTRAVENOUS
  Filled 2023-08-19: qty 100

## 2023-08-19 MED ORDER — LOPERAMIDE HCL 2 MG PO CAPS
2.0000 mg | ORAL_CAPSULE | Freq: Every day | ORAL | Status: AC
  Administered 2023-08-19 – 2023-08-20 (×2): 2 mg via ORAL
  Filled 2023-08-19 (×2): qty 1

## 2023-08-19 MED ORDER — LOPERAMIDE HCL 2 MG PO CAPS: 2.0000 mg | ORAL_CAPSULE | Freq: Every day | ORAL | Status: DC

## 2023-08-19 MED ORDER — MORPHINE SULFATE (PF) 2 MG/ML IV SOLN
1.0000 mg | Freq: Once | INTRAVENOUS | Status: AC
  Administered 2023-08-19: 1 mg via INTRAVENOUS
  Filled 2023-08-19: qty 1

## 2023-08-19 MED ORDER — PSYLLIUM 95 % PO PACK
1.0000 | PACK | Freq: Every day | ORAL | Status: DC
  Administered 2023-08-19 – 2023-08-21 (×3): 1 via ORAL
  Filled 2023-08-19 (×4): qty 1

## 2023-08-19 NOTE — Progress Notes (Signed)
 PROGRESS NOTE        PATIENT DETAILS Name: [name] Age: 53 y.o. Sex: male Date of Birth: [birth year] Admit Date: 08/10/2023 Admitting Physician Hoyt Macleod, MD [id number], Meade Spencer, NP  Brief Summary: Patient is a 53 y.o.  male EtOH use, chronic pancreatitis, polysubstance abuse, PAF, WPW syndrome s/p ablation-who presented with abdominal pain, vomiting/diarrhea-patient was found to have AKI/metabolic acidosis/hyperkalemia and subsequent admitted to the hospitalist service.  See below for further details  Significant events: 4/3>> admit to [facility]  Significant studies: 4/6>> CT abdomen/pelvis: Chronic pancreatitis with pseudocyst, gastritis.  Significant microbiology data: None  Procedures: None  Consults: Nephrology PCCM  Subjective: No major issues overnight-abdominal pain is somewhat better.  Claims he had some vomiting yesterday but none today.  Claims he had quite a few loose stools yesterday.  Asking for more narcotics or adjustment of his narcotic dosage.  Objective: Vitals: Blood pressure 109/81, pulse 84, temperature 98 F (36.7 C), temperature source Oral, resp. rate 20, height 5\' 8"  (1.727 m), weight 56.7 kg, SpO2 99%.   Exam: Gen Exam:Alert awake-not in any distress HEENT:atraumatic, normocephalic Chest: B/L clear to auscultation anteriorly CVS:S1S2 regular Abdomen: Soft-mild tenderness in the epigastric area. 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 Managed with IVF/Lokelma-unfortunately-continue to have borderline hyperkalemia-now stable on Lokelma twice daily dosing Repeat electrolytes tomorrow.    Severe hypomagnesemia Likely due to GI loss from chronic pancreatitis related malabsorption/diarrhea-not sure if this is related subset of RTA as well Magnesium levels remain pretty low-in spite of almost daily supplementation-will replete again today-recheck later this evening.  Diarrhea Likely related to pancreatic insufficiency Some of this is chronic Continue Creon Add Metamucil Add daily Imodium for 2 days Suspect this is contributing to persistent hypomagnesemia.  None anion gap metabolic acidosis Secondary to diarrhea due to chronic pancreatitis. Improved Follow electrolytes  Acute urinary retention Trial successful-no longer with Foley. Continue Flomax.  Intractable nausea/vomiting secondary to acute on chronic pancreatitis and gastritis Some intermittent nausea and vomiting but overall much better. Continue Creon Continue PPI/Carafate. As needed narcotics-patient aware that no further adjustment in his narcotic regimen will be done-he does exhibit some narcotic seeking behavior asking for decrease frequency of his narcotic medications.  As noted above-abdominal pain is stable-exam is benign. Per patient-he has been referred to outpatient pain management.  History of PAF History of WPW syndrome s/p ablation Telemetry monitoring Metoprolol  HTN BP stable Metoprolol  Tobacco abuse Transdermal nicotine  EtOH use Polysubstance abuse UDS on 3/21 positive for benzo/cocaine/THC No signs of alcohol withdrawal  Code status:   Code Status: Full Code   DVT Prophylaxis:  Place and maintain sequential compression device Start: 08/13/23 0759   Family Communication: None at bedside   Disposition Plan: Status is:  Inpatient Remains inpatient appropriate because: Severity of illness   Planned Discharge Destination:Home   Diet: Diet Order             Diet regular Room service appropriate? Yes; Fluid consistency: Thin; Fluid restriction: 1500 mL Fluid  Diet effective now                     Antimicrobial agents: Anti-infectives (From admission, onward)    None        MEDICATIONS: Scheduled Meds:  Chlorhexidine Gluconate Cloth  6 each Topical Daily   folic acid  1 mg Oral Daily   lipase/protease/amylase  72,000 Units Oral TID WC   metoprolol succinate  200 mg Oral Daily   multivitamin with minerals  1 tablet Oral Daily   nicotine  21 mg Transdermal Daily   pantoprazole  40 mg Oral BID   sodium zirconium cyclosilicate  10 g Oral BID   sucralfate  1 g Oral TID WC & HS   tamsulosin  0.4 mg Oral Daily   thiamine  100 mg Oral Daily   Continuous Infusions:   PRN Meds:.acetaminophen **OR** acetaminophen, albuterol, ALPRAZolam, bisacodyl, hydrALAZINE, lipase/protease/amylase **AND** lipase/protease/amylase, loperamide, morphine injection, ondansetron (ZOFRAN) IV, mouth rinse, oxyCODONE, polyethylene glycol   I have personally reviewed following labs and imaging studies  LABORATORY DATA: CBC: Recent Labs  Lab 08/13/23 0440 08/14/23 0341 08/15/23 0307 08/16/23 0300  WBC 7.8 11.1* 9.4 10.9*  HGB 7.0* 9.6* 10.1* 10.1*  HCT 22.8* 29.7* 32.1* 32.3*  MCV 89.1 85.8 86.5 87.5  PLT 119* 117* 129* 150    Basic Metabolic Panel: Recent Labs  Lab 08/12/23 1604 08/13/23 0440 08/13/23 1001 08/14/23 0341 08/14/23 1257 08/15/23 0307 08/16/23 0300 08/16/23 1636 08/17/23 0327 08/18/23 0628 08/19/23 0639  NA 138 136   < > 136   < > 137 136  --  136 134* 135  K 4.5 5.7*   < > 5.8*   < > 5.3* 4.8  --  5.5* 5.2* 4.1  CL 113* 111   < > 108   < > 104 102  --  107 103 101  CO2 18* 19*   < > 20*   < > 24 24  --  22 20* 20*  GLUCOSE 126* 87   < > 86   < > 52* 80  --  167* 214* 208*   BUN <5* 7   < > 7   < > 8 9  --  9 15 12   CREATININE 0.91 0.84   < > 0.83   < > 0.77 1.06  --  0.98 1.07 0.89  CALCIUM 6.9* 7.1*   < > 7.2*   < > 8.6* 8.4*  --  8.9 9.1 8.8*  MG 1.2* 1.3*  --  1.0*   < > 1.2* 0.9* 1.5* 1.8 1.1* 1.1*  PHOS 3.6 2.3*  --  3.2  --  3.5  --   --  2.8  --   --    < > = values in this interval not displayed.    GFR: Estimated Creatinine Clearance: 76.1 mL/min (by C-G formula based on SCr of 0.89 mg/dL).  Liver Function Tests: Recent Labs  Lab 08/13/23 1001  AST 77*  ALT 33  ALKPHOS 83  BILITOT 0.5  PROT 6.9  ALBUMIN 3.4*   No results  for input(s): "LIPASE", "AMYLASE" in the last 168 hours.  No results for input(s): "AMMONIA" in the last 168 hours.  Coagulation Profile: No results for input(s): "INR", "PROTIME" in the last 168 hours.   Cardiac Enzymes: No results for input(s): "CKTOTAL", "CKMB", "CKMBINDEX", "TROPONINI" in the last 168 hours.   BNP (last 3 results) No results for input(s): "PROBNP" in the last 8760 hours.  Lipid Profile: No results for input(s): "CHOL", "HDL", "LDLCALC", "TRIG", "CHOLHDL", "LDLDIRECT" in the last 72 hours.  Thyroid Function Tests: No results for input(s): "TSH", "T4TOTAL", "FREET4", "T3FREE", "THYROIDAB" in the last 72 hours.  Anemia Panel: No results for input(s): "VITAMINB12", "FOLATE", "FERRITIN", "TIBC", "IRON", "RETICCTPCT" in the last 72 hours.  Urine analysis:    Component Value Date/Time   COLORURINE COLORLESS (A) 08/11/2023 1900   APPEARANCEUR CLEAR 08/11/2023 1900   LABSPEC 1.004 (L) 08/11/2023 1900   PHURINE 5.0 08/11/2023 1900   GLUCOSEU NEGATIVE 08/11/2023 1900   HGBUR NEGATIVE 08/11/2023 1900   HGBUR negative 04/30/2010 1020   BILIRUBINUR NEGATIVE 08/11/2023 1900   BILIRUBINUR negative 09/06/2019 1649   KETONESUR NEGATIVE 08/11/2023 1900   PROTEINUR NEGATIVE 08/11/2023 1900   UROBILINOGEN 0.2 09/06/2019 1649   UROBILINOGEN 0.2 03/15/2015 0508   NITRITE NEGATIVE 08/11/2023 1900    LEUKOCYTESUR NEGATIVE 08/11/2023 1900    Sepsis Labs: Lactic Acid, Venous    Component Value Date/Time   LATICACIDVEN 1.0 08/11/2023 1546    MICROBIOLOGY: Recent Results (from the past 240 hours)  MRSA Next Gen by PCR, Nasal     Status: None   Collection Time: 08/11/23  8:02 PM   Specimen: Nasal Mucosa; Nasal Swab  Result Value Ref Range Status   MRSA by PCR Next Gen NOT DETECTED NOT DETECTED Final    Comment: (NOTE) The GeneXpert MRSA Assay (FDA approved for NASAL specimens only), is one component of a comprehensive MRSA colonization surveillance program. It is not intended to diagnose MRSA infection nor to guide or monitor treatment for MRSA infections. Test performance is not FDA approved in patients less than 46 years old. Performed at [facility], [address], [place], [place] [address]     RADIOLOGY STUDIES/RESULTS: No results found.   LOS: 9 days   Kimberly Penna, MD  [facility]    To contact the attending provider between 7A-7P or the covering provider during after hours 7P-7A, please log into the web site [email or web address] and access using universal [place] password for that web site. If you do not have the password, please call the hospital operator.  08/19/2023, 1:07 PM

## 2023-08-19 NOTE — Plan of Care (Signed)

## 2023-08-20 DIAGNOSIS — N179 Acute kidney failure, unspecified: Secondary | ICD-10-CM | POA: Diagnosis not present

## 2023-08-20 DIAGNOSIS — R197 Diarrhea, unspecified: Secondary | ICD-10-CM | POA: Diagnosis not present

## 2023-08-20 DIAGNOSIS — E875 Hyperkalemia: Secondary | ICD-10-CM | POA: Diagnosis not present

## 2023-08-20 LAB — BASIC METABOLIC PANEL WITH GFR
Anion gap: 11 (ref 5–15)
BUN: 12 mg/dL (ref 6–20)
CO2: 22 mmol/L (ref 22–32)
Calcium: 8.9 mg/dL (ref 8.9–10.3)
Chloride: 102 mmol/L (ref 98–111)
Creatinine, Ser: 0.92 mg/dL (ref 0.61–1.24)
GFR, Estimated: >60 mL/min (ref >60)
Glucose, Bld: 208 mg/dL — ABNORMAL HIGH (ref 70–99)
Potassium: 4.3 mmol/L (ref 3.5–5.1)
Sodium: 135 mmol/L (ref 135–145)

## 2023-08-20 LAB — MAGNESIUM
Magnesium: 1.2 mg/dL — ABNORMAL LOW (ref 1.7–2.4)
Magnesium: 2 mg/dL (ref 1.7–2.4)

## 2023-08-20 MED ORDER — FAMOTIDINE 20 MG PO TABS
40.0000 mg | ORAL_TABLET | Freq: Every day | ORAL | Status: DC
  Administered 2023-08-20 – 2023-08-21 (×2): 40 mg via ORAL
  Filled 2023-08-20 (×2): qty 2

## 2023-08-20 MED ORDER — MAGNESIUM SULFATE 4 GM/100ML IV SOLN
4.0000 g | Freq: Once | INTRAVENOUS | Status: AC
  Administered 2023-08-20: 4 g via INTRAVENOUS
  Filled 2023-08-20: qty 100

## 2023-08-20 MED ORDER — MAGNESIUM SULFATE IN D5W 1-5 GM/100ML-% IV SOLN
1.0000 g | Freq: Once | INTRAVENOUS | Status: AC
  Administered 2023-08-20: 1 g via INTRAVENOUS
  Filled 2023-08-20: qty 100

## 2023-08-20 NOTE — Progress Notes (Signed)
 PROGRESS NOTE        PATIENT DETAILS Name: [name] Age: 53 y.o. Sex: male Date of Birth: 07-05-1970 Admit Date: 08/10/2023 Admitting Physician Hoyt Macleod, MD NWG:[id number], Meade Spencer, NP  Brief Summary: Patient is a 53 y.o.  male EtOH use, chronic pancreatitis, polysubstance abuse, PAF, WPW syndrome s/p ablation-who presented with abdominal pain, vomiting/diarrhea-patient was found to have AKI/metabolic acidosis/hyperkalemia and subsequent admitted to the hospitalist service.  See below for further details  Significant events: 4/3>> admit to [facility]  Significant studies: 4/6>> CT abdomen/pelvis: Chronic pancreatitis with pseudocyst, gastritis.  Significant microbiology data: None  Procedures: None  Consults: Nephrology PCCM  Subjective: No major issues-unchanged abdominal pain-some mild loose stools but no diarrhea.  No nausea or vomiting.  Objective: Vitals: Blood pressure 121/84, pulse 76, temperature 98.2 F (36.8 C), temperature source Oral, resp. rate 14, height 5\' 8"  (1.727 m), weight 56.7 kg, SpO2 99%.   Exam: Gen Exam:Alert awake-not in any distress HEENT:atraumatic, normocephalic Chest: B/L clear to auscultation anteriorly CVS:S1S2 regular Abdomen:soft non tender, non distended Extremities:no edema Neurology: Non focal Skin: no rash  Pertinent Labs/Radiology:    Latest Ref Rng & Units 08/16/2023    3:00 AM 08/15/2023    3:07 AM 08/14/2023    3:41 AM  CBC  WBC 4.0 - 10.5 K/uL 10.9  9.4  11.1   Hemoglobin 13.0 - 17.0 g/dL 86.5  78.4  9.6   Hematocrit 39.0 - 52.0 % 32.3  32.1  29.7   Platelets 150 - 400 K/uL 150  129  117     Lab Results  Component Value Date   NA 135 08/20/2023   K 4.3 08/20/2023   CL 102 08/20/2023   CO2 22 08/20/2023      Assessment/Plan: Hyperkalemia Unclear etiology-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 Managed with IVF/Lokelma-unfortunately-continue to have borderline  hyperkalemia-now stable on Lokelma twice daily dosing Continue to follow electrolytes periodically.  Severe hypomagnesemia Likely due to GI loss from chronic pancreatitis related malabsorption/diarrhea-not sure if this is related subset of RTA as well.  Not sure if this is related to PPI use Magnesium level still low-will replete again today-stop PPI-switch to Pepcid Recheck magnesium later this evening.   Diarrhea Likely related to pancreatic insufficiency Some of this is chronic-has stabilized overnight. Continue Creon Add Metamucil As needed Imodium. Suspect this is contributing to persistent hypomagnesemia.  None anion gap metabolic acidosis Secondary to diarrhea due to chronic pancreatitis. Improved Follow electrolytes  Acute urinary retention Trial successful-no longer with Foley. Continue Flomax.  Intractable nausea/vomiting secondary to acute on chronic pancreatitis and gastritis Some intermittent nausea and vomiting but overall much better. Continue Creon Continue PPI/Carafate. As needed narcotics-patient aware that no further adjustment in his narcotic regimen will be done-he does exhibit some narcotic seeking behavior asking for decrease frequency of his narcotic medications.  As noted above-abdominal pain is stable-exam is benign. Per patient-he has been referred to outpatient pain management.  History of PAF History of WPW syndrome s/p ablation Telemetry monitoring Metoprolol  HTN BP stable Metoprolol  Tobacco abuse Transdermal nicotine  EtOH use Polysubstance abuse UDS on 3/21 positive for benzo/cocaine/THC No signs of alcohol withdrawal  Code status:   Code Status: Full Code   DVT Prophylaxis: Place and maintain sequential compression device Start: 08/13/23 0759   Family Communication: None at bedside  Disposition Plan: Status is: Inpatient Remains inpatient appropriate because: Severity of illness   Planned Discharge  Destination:Home   Diet: Diet Order             Diet regular Room service appropriate? Yes; Fluid consistency: Thin; Fluid restriction: 1500 mL Fluid  Diet effective now                     Antimicrobial agents: Anti-infectives (From admission, onward)    None        MEDICATIONS: Scheduled Meds:  Chlorhexidine Gluconate Cloth  6 each Topical Daily   famotidine  40 mg Oral Daily   folic acid  1 mg Oral Daily   lipase/protease/amylase  72,000 Units Oral TID WC   metoprolol succinate  200 mg Oral Daily   multivitamin with minerals  1 tablet Oral Daily   nicotine  21 mg Transdermal Daily   psyllium  1 packet Oral Daily   sodium zirconium cyclosilicate  10 g Oral BID   sucralfate  1 g Oral TID WC & HS   tamsulosin  0.4 mg Oral Daily   thiamine  100 mg Oral Daily   Continuous Infusions:  magnesium sulfate bolus IVPB 4 g (08/20/23 1322)    PRN Meds:.acetaminophen **OR** acetaminophen, albuterol, ALPRAZolam, bisacodyl, hydrALAZINE, lipase/protease/amylase **AND** lipase/protease/amylase, loperamide, morphine injection, ondansetron (ZOFRAN) IV, mouth rinse, oxyCODONE, polyethylene glycol   I have personally reviewed following labs and imaging studies  LABORATORY DATA: CBC: Recent Labs  Lab 08/14/23 0341 08/15/23 0307 08/16/23 0300  WBC 11.1* 9.4 10.9*  HGB 9.6* 10.1* 10.1*  HCT 29.7* 32.1* 32.3*  MCV 85.8 86.5 87.5  PLT 117* 129* 150    Basic Metabolic Panel: Recent Labs  Lab 08/14/23 0341 08/14/23 1257 08/15/23 0307 08/16/23 0300 08/16/23 1636 08/17/23 0327 08/18/23 0628 08/19/23 0639 08/19/23 1845 08/20/23 1126  NA 136   < > 137 136  --  136 134* 135  --  135  K 5.8*   < > 5.3* 4.8  --  5.5* 5.2* 4.1  --  4.3  CL 108   < > 104 102  --  107 103 101  --  102  CO2 20*   < > 24 24  --  22 20* 20*  --  22  GLUCOSE 86   < > 52* 80  --  167* 214* 208*  --  208*  BUN 7   < > 8 9  --  9 15 12   --  12  CREATININE 0.83   < > 0.77 1.06  --  0.98 1.07  0.89  --  0.92  CALCIUM 7.2*   < > 8.6* 8.4*  --  8.9 9.1 8.8*  --  8.9  MG 1.0*   < > 1.2* 0.9*   < > 1.8 1.1* 1.1* 2.5* 1.2*  PHOS 3.2  --  3.5  --   --  2.8  --   --   --   --    < > = values in this interval not displayed.    GFR: Estimated Creatinine Clearance: 73.6 mL/min (by C-G formula based on SCr of 0.92 mg/dL).  Liver Function Tests: No results for input(s): "AST", "ALT", "ALKPHOS", "BILITOT", "PROT", "ALBUMIN" in the last 168 hours.  No results for input(s): "LIPASE", "AMYLASE" in the last 168 hours.  No results for input(s): "AMMONIA" in the last 168 hours.  Coagulation Profile: No results for input(s): "INR", "PROTIME" in the last 168 hours.  Cardiac Enzymes: No results for input(s): "CKTOTAL", "CKMB", "CKMBINDEX", "TROPONINI" in the last 168 hours.   BNP (last 3 results) No results for input(s): "PROBNP" in the last 8760 hours.  Lipid Profile: No results for input(s): "CHOL", "HDL", "LDLCALC", "TRIG", "CHOLHDL", "LDLDIRECT" in the last 72 hours.  Thyroid Function Tests: No results for input(s): "TSH", "T4TOTAL", "FREET4", "T3FREE", "THYROIDAB" in the last 72 hours.  Anemia Panel: No results for input(s): "VITAMINB12", "FOLATE", "FERRITIN", "TIBC", "IRON", "RETICCTPCT" in the last 72 hours.  Urine analysis:    Component Value Date/Time   COLORURINE COLORLESS (A) 08/11/2023 1900   APPEARANCEUR CLEAR 08/11/2023 1900   LABSPEC 1.004 (L) 08/11/2023 1900   PHURINE 5.0 08/11/2023 1900   GLUCOSEU NEGATIVE 08/11/2023 1900   HGBUR NEGATIVE 08/11/2023 1900   HGBUR negative 04/30/2010 1020   BILIRUBINUR NEGATIVE 08/11/2023 1900   BILIRUBINUR negative 09/06/2019 1649   KETONESUR NEGATIVE 08/11/2023 1900   PROTEINUR NEGATIVE 08/11/2023 1900   UROBILINOGEN 0.2 09/06/2019 1649   UROBILINOGEN 0.2 03/15/2015 0508   NITRITE NEGATIVE 08/11/2023 1900   LEUKOCYTESUR NEGATIVE 08/11/2023 1900    Sepsis Labs: Lactic Acid, Venous    Component Value Date/Time    LATICACIDVEN 1.0 08/11/2023 1546    MICROBIOLOGY: Recent Results (from the past 240 hours)  MRSA Next Gen by PCR, Nasal     Status: None   Collection Time: 08/11/23  8:02 PM   Specimen: Nasal Mucosa; Nasal Swab  Result Value Ref Range Status   MRSA by PCR Next Gen NOT DETECTED NOT DETECTED Final    Comment: (NOTE) The GeneXpert MRSA Assay (FDA approved for NASAL specimens only), is one component of a comprehensive MRSA colonization surveillance program. It is not intended to diagnose MRSA infection nor to guide or monitor treatment for MRSA infections. Test performance is not FDA approved in patients less than 75 years old. Performed at [facility], [address], [place], [place] [address]     RADIOLOGY STUDIES/RESULTS: No results found.   LOS: 10 days   Kimberly Penna, MD  [facility]    To contact the attending provider between 7A-7P or the covering provider during after hours 7P-7A, please log into the web site [email or web address] and access using universal [employer] password for that web site. If you do not have the password, please call the hospital operator.  08/20/2023, 1:37 PM

## 2023-08-20 NOTE — Plan of Care (Signed)
  Problem: Education: Goal: Knowledge of General Education information will improve Description: Including pain rating scale, medication(s)/side effects and non-pharmacologic comfort measures Outcome: Progressing   Problem: Health Behavior/Discharge Planning: Goal: Ability to manage health-related needs will improve Outcome: Progressing   Problem: Clinical Measurements: Goal: Ability to maintain clinical measurements within normal limits will improve Outcome: Progressing   Problem: Nutrition: Goal: Adequate nutrition will be maintained Outcome: Progressing   Problem: Coping: Goal: Level of anxiety will decrease Outcome: Progressing   Problem: Pain Managment: Goal: General experience of comfort will improve and/or be controlled Outcome: Progressing   Problem: Safety: Goal: Ability to remain free from injury will improve Outcome: Progressing

## 2023-08-21 ENCOUNTER — Other Ambulatory Visit (HOSPITAL_COMMUNITY): Payer: Self-pay

## 2023-08-21 ENCOUNTER — Telehealth (HOSPITAL_COMMUNITY): Payer: Self-pay | Admitting: Pharmacy Technician

## 2023-08-21 DIAGNOSIS — N179 Acute kidney failure, unspecified: Secondary | ICD-10-CM | POA: Diagnosis not present

## 2023-08-21 DIAGNOSIS — R197 Diarrhea, unspecified: Secondary | ICD-10-CM | POA: Diagnosis not present

## 2023-08-21 DIAGNOSIS — E875 Hyperkalemia: Secondary | ICD-10-CM | POA: Diagnosis not present

## 2023-08-21 LAB — BASIC METABOLIC PANEL WITH GFR
Anion gap: 11 (ref 5–15)
BUN: 13 mg/dL (ref 6–20)
CO2: 22 mmol/L (ref 22–32)
Calcium: 9.7 mg/dL (ref 8.9–10.3)
Chloride: 103 mmol/L (ref 98–111)
Creatinine, Ser: 0.89 mg/dL (ref 0.61–1.24)
GFR, Estimated: >60 mL/min (ref >60)
Glucose, Bld: 143 mg/dL — ABNORMAL HIGH (ref 70–99)
Potassium: 4.6 mmol/L (ref 3.5–5.1)
Sodium: 136 mmol/L (ref 135–145)

## 2023-08-21 LAB — MAGNESIUM: Magnesium: 1.7 mg/dL (ref 1.7–2.4)

## 2023-08-21 MED ORDER — METOPROLOL SUCCINATE ER 200 MG PO TB24
200.0000 mg | ORAL_TABLET | Freq: Every day | ORAL | 0 refills | Status: DC
Start: 2023-08-21 — End: 2023-11-13
  Filled 2023-08-21: qty 30, 30d supply, fill #0

## 2023-08-21 MED ORDER — MAGNESIUM OXIDE -MG SUPPLEMENT 400 (240 MG) MG PO TABS
400.0000 mg | ORAL_TABLET | Freq: Every day | ORAL | 0 refills | Status: DC
  Filled 2023-08-21: qty 30, 30d supply, fill #0

## 2023-08-21 MED ORDER — FOLIC ACID 1 MG PO TABS
1.0000 mg | ORAL_TABLET | Freq: Every day | ORAL | 0 refills | Status: DC
  Filled 2023-08-21: qty 30, 30d supply, fill #0

## 2023-08-21 MED ORDER — PANCRELIPASE (LIP-PROT-AMYL) 36000-114000 UNITS PO CPEP
ORAL_CAPSULE | ORAL | 0 refills | Status: DC
  Filled 2023-08-21: qty 200, 33d supply, fill #0

## 2023-08-21 MED ORDER — THIAMINE HCL 100 MG PO TABS
100.0000 mg | ORAL_TABLET | Freq: Every day | ORAL | 0 refills | Status: DC
  Filled 2023-08-21: qty 30, 30d supply, fill #0

## 2023-08-21 MED ORDER — TAMSULOSIN HCL 0.4 MG PO CAPS
0.4000 mg | ORAL_CAPSULE | Freq: Every day | ORAL | 1 refills | Status: DC
  Filled 2023-08-21: qty 30, 30d supply, fill #0

## 2023-08-21 MED ORDER — ADULT MULTIVITAMIN W/MINERALS CH
1.0000 | ORAL_TABLET | Freq: Every day | ORAL | 0 refills | Status: AC
  Filled 2023-08-21: qty 30, 30d supply, fill #0

## 2023-08-21 MED ORDER — ONDANSETRON 4 MG PO TBDP
4.0000 mg | ORAL_TABLET | Freq: Every day | ORAL | 0 refills | Status: DC | PRN
  Filled 2023-08-21: qty 20, 20d supply, fill #0

## 2023-08-21 MED ORDER — OXYCODONE HCL 5 MG PO TABS
5.0000 mg | ORAL_TABLET | Freq: Three times a day (TID) | ORAL | 0 refills | Status: DC | PRN
  Filled 2023-08-21: qty 15, 5d supply, fill #0

## 2023-08-21 MED ORDER — MAGNESIUM SULFATE 4 GM/100ML IV SOLN
4.0000 g | Freq: Once | INTRAVENOUS | Status: AC
  Administered 2023-08-21: 4 g via INTRAVENOUS
  Filled 2023-08-21: qty 100

## 2023-08-21 MED ORDER — PSYLLIUM 95 % PO PACK
1.0000 | PACK | Freq: Every day | ORAL | 0 refills | Status: DC
  Filled 2023-08-21: qty 240, 240d supply, fill #0

## 2023-08-21 MED ORDER — LIDOCAINE VISCOUS HCL 2 % MT SOLN
15.0000 mL | OROMUCOSAL | 0 refills | Status: DC | PRN
  Filled 2023-08-21: qty 100, 1d supply, fill #0

## 2023-08-21 MED ORDER — SUCRALFATE 1 G PO TABS
1.0000 g | ORAL_TABLET | Freq: Three times a day (TID) | ORAL | 0 refills | Status: DC
  Filled 2023-08-21: qty 48, 16d supply, fill #0

## 2023-08-21 MED ORDER — DICYCLOMINE HCL 20 MG PO TABS
20.0000 mg | ORAL_TABLET | Freq: Every day | ORAL | 0 refills | Status: DC | PRN
  Filled 2023-08-21: qty 30, 30d supply, fill #0

## 2023-08-21 MED ORDER — FAMOTIDINE 40 MG PO TABS
40.0000 mg | ORAL_TABLET | Freq: Every day | ORAL | 0 refills | Status: DC
  Filled 2023-08-21: qty 30, 30d supply, fill #0

## 2023-08-21 MED ORDER — SODIUM ZIRCONIUM CYCLOSILICATE 10 G PO PACK
10.0000 g | PACK | Freq: Two times a day (BID) | ORAL | 1 refills | Status: DC
  Filled 2023-08-21: qty 60, 30d supply, fill #0

## 2023-08-21 NOTE — Discharge Summary (Addendum)
 PATIENT DETAILS Name: [name] Age: 53 y.o. Sex: male Date of Birth: 03/01/1971 MRN: [id number]. Admitting Physician: Hoyt Macleod, MD WUJ:[id number], Meade Spencer, NP  Admit Date: 08/10/2023 Discharge date: 08/21/2023  Recommendations for Outpatient Follow-up:  Follow up with PCP in 1-2 weeks Please obtain CMP/CBC in one week  Admitted From:  Home  Disposition: Home   Discharge Condition: good  CODE STATUS:   Code Status: Full Code   Diet recommendation:  Diet Order             Diet - low sodium heart healthy           Diet regular Room service appropriate? Yes; Fluid consistency: Thin; Fluid restriction: 1500 mL Fluid  Diet effective now                    Brief Summary: Patient is a 53 y.o.  male EtOH use, chronic pancreatitis, polysubstance abuse, PAF, WPW syndrome s/p ablation-who presented with abdominal pain, vomiting/diarrhea-patient was found to have AKI/metabolic acidosis/hyperkalemia and subsequent admitted to the hospitalist service.  See below for further details   Significant events: 4/3>> admit to [facility]   Significant studies: 4/6>> CT abdomen/pelvis: Chronic pancreatitis with pseudocyst, gastritis.   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 Managed with IVF/Lokelma -unfortunately-continue to have borderline hyperkalemia-now stable for several days on Lokelma  twice daily dosing Continue to follow electrolytes periodically. Please note-patient was repeatedly counseled regarding importance of a low potassium diet  Addendum Unfortunately after discharge-was notified by pharmacy that his insurance even after resubmitting documentation for counseling of low potassium diet still denied Lokelma , I called his insurance company today and have filed an expedited appeal.  This will take another 24  hours-in the meantime-I have sent Kayexalate  x 5 days to his pharmacy to his [facility].   discussed with [facility] pharmacy-they will notify patient of above.   Severe hypomagnesemia Likely due to GI loss from chronic pancreatitis related malabsorption/diarrhea-not sure if this is related subset of RTA as well. Not sure if this is related to PPI use Magnesium  levels if any normalized-PCP to continue to monitor. No longer on PPI has been switched to Pepcid .      Diarrhea Likely related to pancreatic insufficiency Some of this is chronic-has stabilized over the past several days and at baseline. Continue Creon  Continue Metamucil As needed Imodium . Suspect this is contributing to persistent hypomagnesemia.   Non anion gap metabolic acidosis Secondary to diarrhea due to chronic pancreatitis. Improved Follow electrolytes   Acute urinary retention Trial successful-no longer with Foley. Continue Flomax .   Intractable nausea/vomiting secondary to acute on chronic pancreatitis and gastritis Nausea/vomiting has resolved- Continue Creon  No longer on PPI-switch to Pepcid  Continue Carafate  Abdominal exam is benign-mild epigastric pain without any peritoneal signs. Continue as needed narcotics-claims that his PCP has referred him to a pain management MD.  He request that I give him a few days supply of narcotics till he is seen by pain management.  Reviewed narcotic database-appears that he has been on narcotics for the past several months-last refilled on 3/27-I will give him a few days of narcotics till he is seen by his PCP/pain management doctor.  I have counseled him repeatedly regarding risk of polypharmacy.   History of PAF History of WPW syndrome s/p ablation Telemetry monitoring Metoprolol    HTN BP stable Metoprolol    Tobacco  abuse Transdermal nicotine    EtOH use Polysubstance abuse UDS on 3/21 positive for benzo/cocaine/THC No signs of alcohol  withdrawal   Discharge  Diagnoses:  Principal Problem:   Intractable diarrhea  Discharge Instructions:  Activity:  As tolerated   Discharge Instructions     Call MD for:  difficulty breathing, headache or visual disturbances   Complete by: As directed    Call MD for:  extreme fatigue   Complete by: As directed    Call MD for:  persistant nausea and vomiting   Complete by: As directed    Call MD for:  severe uncontrolled pain   Complete by: As directed    Diet - low sodium heart healthy   Complete by: As directed    Low Potassium diet   Discharge instructions   Complete by: As directed    Follow with Primary MD  Marius Siemens, NP in 1-2 weeks  Please get a complete blood count and chemistry panel checked by your Primary MD at your next visit, and again as instructed by your Primary MD.  Get Medicines reviewed and adjusted: Please take all your medications with you for your next visit with your Primary MD  Laboratory/radiological data: Please request your Primary MD to go over all hospital tests and procedure/radiological results at the follow up, please ask your Primary MD to get all Hospital records sent to his/her office.  In some cases, they will be blood work, cultures and biopsy results pending at the time of your discharge. Please request that your primary care M.D. follows up on these results.  Also Note the following: If you experience worsening of your admission symptoms, develop shortness of breath, life threatening emergency, suicidal or homicidal thoughts you must seek medical attention immediately by calling 911 or calling your MD immediately  if symptoms less severe.  You must read complete instructions/literature along with all the possible adverse reactions/side effects for all the Medicines you take and that have been prescribed to you. Take any new Medicines after you have completely understood and accpet all the possible adverse reactions/side effects.   Do not drive when  taking Pain medications or sleeping medications (Benzodaizepines)  Do not take more than prescribed Pain, Sleep and Anxiety Medications. It is not advisable to combine anxiety,sleep and pain medications without talking with your primary care practitioner  Special Instructions: If you have smoked or chewed Tobacco  in the last 2 yrs please stop smoking, stop any regular Alcohol   and or any Recreational drug use.  Wear Seat belts while driving.  Please note: You were cared for by a hospitalist during your hospital stay. Once you are discharged, your primary care physician will handle any further medical issues. Please note that NO REFILLS for any discharge medications will be authorized once you are discharged, as it is imperative that you return to your primary care physician (or establish a relationship with a primary care physician if you do not have one) for your post hospital discharge needs so that they can reassess your need for medications and monitor your lab values.   Increase activity slowly   Complete by: As directed       Allergies as of 08/21/2023       Reactions   Robaxin  [methocarbamol ] Other (See Comments)   jumpy limbs   Aspirin  Other (See Comments)   Unknown reaction   Shellfish-derived Products Nausea And Vomiting, Rash   Trazodone  And Nefazodone Other (See Comments)   Muscle spasms  Adhesive [tape] Itching   Fish-derived Products    Latex Itching   Toradol  [ketorolac  Tromethamine ] Other (See Comments)   Has ulcers; cannot have this   Contrast Media [iodinated Contrast Media] Hives   Reglan  [metoclopramide ] Other (See Comments)   Muscle spasms   Salmon [fish Oil] Nausea And Vomiting, Rash        Medication List     STOP taking these medications    benzonatate  100 MG capsule Commonly known as: TESSALON    cyclobenzaprine  10 MG tablet Commonly known as: FLEXERIL    HYDROcodone -acetaminophen  5-325 MG tablet Commonly known as: NORCO/VICODIN   liver  oil-zinc  oxide 40 % ointment Commonly known as: DESITIN   magic mouthwash Soln   nicotine  21 mg/24hr patch Commonly known as: NICODERM CQ  - dosed in mg/24 hours   nitroGLYCERIN  0.4 MG SL tablet Commonly known as: NITROSTAT    omeprazole  40 MG capsule Commonly known as: PRILOSEC   oxyCODONE -acetaminophen  5-325 MG tablet Commonly known as: Percocet   pantoprazole  40 MG tablet Commonly known as: Protonix    pregabalin  100 MG capsule Commonly known as: LYRICA    sucralfate  1 GM/10ML suspension Commonly known as: Carafate  Replaced by: sucralfate  1 g tablet       TAKE these medications    acetaminophen  500 MG tablet Commonly known as: TYLENOL  Take 1,000 mg by mouth daily as needed for mild pain (pain score 1-3) or moderate pain (pain score 4-6).   CertaVite/Antioxidants Tabs Take 1 tablet by mouth daily.   Creon  36000-114000 units Cpep capsule Generic drug: lipase/protease/amylase Take 2 capsules (72,000 Units total) by mouth 3 (three) times daily with meals. May also take 1 capsule (36,000 Units total) as needed (with snacks). What changed: See the new instructions.   dicyclomine  20 MG tablet Commonly known as: BENTYL  Take 1 tablet (20 mg total) by mouth daily as needed for spasms.   famotidine  40 MG tablet Commonly known as: PEPCID  Take 1 tablet (40 mg total) by mouth daily.   fluticasone  50 MCG/ACT nasal spray Commonly known as: FLONASE  Place 2 sprays into both nostrils daily as needed for allergies.   folic acid  1 MG tablet Commonly known as: FOLVITE  Take 1 tablet (1 mg total) by mouth daily.   Kaopectate 262 MG/15ML suspension Generic drug: bismuth  subsalicylate Take 15 mLs by mouth daily as needed for indigestion or diarrhea or loose stools.   lidocaine  2 % solution Commonly known as: XYLOCAINE  Swish and Spit 15 mLs in the mouth or throat every 4 (four) hours as needed for mouth pain.   loperamide  2 MG capsule Commonly known as: IMODIUM  Take 1  capsule (2 mg total) by mouth as needed for diarrhea or loose stools.   magnesium  oxide 400 (240 Mg) MG tablet Commonly known as: MAG-OX Take 1 tablet (400 mg total) by mouth daily.   metoprolol  200 MG 24 hr tablet Commonly known as: TOPROL -XL Take 1 tablet (200 mg total) by mouth daily.   ondansetron  4 MG disintegrating tablet Commonly known as: ZOFRAN -ODT Take 1 tablet (4 mg total) by mouth daily as needed for nausea or vomiting.   oxyCODONE  5 MG immediate release tablet Commonly known as: Oxy IR/ROXICODONE  Take 1 tablet (5 mg total) by mouth every 8 (eight) hours as needed for moderate pain (pain score 4-6). What changed:  when to take this reasons to take this   psyllium 95 % Pack Commonly known as: HYDROCIL/METAMUCIL Take 1 packet by mouth daily.   sodium zirconium cyclosilicate  10 g Pack packet Commonly known  as: LOKELMA  Take 10 g by mouth 2 (two) times daily.   sucralfate  1 g tablet Commonly known as: Carafate  Take 1 tablet (1 g total) by mouth in the morning, at noon, and at bedtime. Replaces: sucralfate  1 GM/10ML suspension   tamsulosin  0.4 MG Caps capsule Commonly known as: FLOMAX  Take 1 capsule (0.4 mg total) by mouth daily.   thiamine  100 MG tablet Commonly known as: VITAMIN B1 Take 1 tablet (100 mg total) by mouth daily.        Follow-up Information     [facility] Follow up.   Specialty: Rehabilitation Why: Please call and schedule your outpatient physical and occupational therapy Contact information: [address] [place]  [address] [phone]        Marius Siemens, NP Follow up.   Specialty: Internal Medicine Why: TIME : 10:50 AM PL,EASE ARRIVE AT 10:30 AM DATE : August 31, 2023  PLEASE BRING ALL MEDICATION, ID  and  INS CARD Contact information: [address] [place] [address] [phone]                Allergies  Allergen Reactions    Robaxin  [Methocarbamol ] Other (See Comments)    jumpy limbs   Aspirin  Other (See Comments)    Unknown reaction   Shellfish-Derived Products Nausea And Vomiting and Rash   Trazodone  And Nefazodone Other (See Comments)    Muscle spasms   Adhesive [Tape] Itching   Fish-Derived Products    Latex Itching   Toradol  [Ketorolac  Tromethamine ] Other (See Comments)    Has ulcers; cannot have this   Contrast Media [Iodinated Contrast Media] Hives   Reglan  [Metoclopramide ] Other (See Comments)    Muscle spasms   Salmon [Fish Oil] Nausea And Vomiting and Rash     Other Procedures/Studies: CT ABDOMEN PELVIS WO CONTRAST Result Date: 08/13/2023 CLINICAL DATA:  Abdominal pain, chronic pancreatitis. EXAM: CT ABDOMEN AND PELVIS WITHOUT CONTRAST TECHNIQUE: Multidetector CT imaging of the abdomen and pelvis was performed following the standard protocol without IV contrast. RADIATION DOSE REDUCTION: This exam was performed according to the departmental dose-optimization program which includes automated exposure control, adjustment of the mA and/or kV according to patient size and/or use of iterative reconstruction technique. COMPARISON:  07/28/2023. FINDINGS: Lower chest: No acute abnormality. Hepatobiliary: No focal liver abnormality is seen. No gallstones, gallbladder wall thickening, or biliary dilatation. Pancreas: Multiple calcifications are noted in the pancreas, compatible with history of chronic pancreatitis. There is a stable cyst in the pancreatic tail measuring 4.2 x 3.6 cm. The smaller cystic lesions are not well seen due to lack of IV contrast. No surrounding inflammatory changes are seen. Spleen: Normal in size without focal abnormality. Adrenals/Urinary Tract: The adrenal glands are within normal limits. No renal calculus or obstructive uropathy bilaterally. Foley catheter is present in the urinary bladder in the bladder is decompressed. Stomach/Bowel: There is diffuse gastric wall thickening. No  bowel obstruction, free air, or pneumatosis. The appendix is not seen. No focal bowel wall thickening or surrounding inflammatory changes. Vascular/Lymphatic: Aortic atherosclerosis. No enlarged abdominal or pelvic lymph nodes. Reproductive: Prostate is unremarkable. Other: No abdominopelvic ascites. Musculoskeletal: No acute osseous abnormality. IMPRESSION: 1. Stable findings of chronic pancreatitis with pseudocyst formation. No acute surrounding inflammatory changes are seen. 2. Diffuse gastric wall thickening, suggesting gastritis. 3. The appendix is not seen. 4. Aortic atherosclerosis. Electronically Signed   By: Wyvonnia Heimlich M.D.   On: 08/13/2023 18:57   US  EKG SITE  RITE Result Date: 08/11/2023 If Site Rite image not attached, placement could not be confirmed due to current cardiac rhythm.  DG Chest 2 View Result Date: 08/10/2023 CLINICAL DATA:  Chest pain. EXAM: CHEST - 2 VIEW COMPARISON:  08/03/2023. FINDINGS: Bilateral lung fields are clear. Bilateral costophrenic angles are clear. Normal cardio-mediastinal silhouette. No acute osseous abnormalities. The soft tissues are within normal limits. IMPRESSION: No active cardiopulmonary disease. Electronically Signed   By: Beula Brunswick M.D.   On: 08/10/2023 11:41   CT ABDOMEN PELVIS WO CONTRAST Result Date: 07/28/2023 CLINICAL DATA:  Abdomen pain EXAM: CT ABDOMEN AND PELVIS WITHOUT CONTRAST TECHNIQUE: Multidetector CT imaging of the abdomen and pelvis was performed following the standard protocol without IV contrast. RADIATION DOSE REDUCTION: This exam was performed according to the departmental dose-optimization program which includes automated exposure control, adjustment of the mA and/or kV according to patient size and/or use of iterative reconstruction technique. COMPARISON:  CT 06/15/2023, 06/08/2023, 01/25/2023, MRI 06/08/2023 FINDINGS: Lower chest: Lung bases demonstrate no acute airspace disease. Hepatobiliary: Gallbladder appears contracted. No  calcified stones or biliary dilatation Pancreas: Scattered calcifications consistent with chronic pancreatitis. Cystic lesions at the distal pancreas measuring up to 4.1 x 3.6 cm, previously 4.2 cm. Spleen: Normal in size without focal abnormality. Adrenals/Urinary Tract: Adrenal glands are normal. Kidneys show no hydronephrosis. Urinary bladder is unremarkable. Stomach/Bowel: Gastric wall thickening as seen on prior exams. Fluid-filled small bowel without obstruction or bowel wall thickening. Vascular/Lymphatic: Aortic atherosclerosis. No enlarged abdominal or pelvic lymph nodes. Reproductive: Prostate is unremarkable. Other: Negative for pelvic effusion or free air Musculoskeletal: No acute or suspicious osseous abnormality IMPRESSION: 1. Findings consistent with chronic pancreatitis. No acute peripancreatic inflammation. Stable cystic lesions at the distal pancreas measuring up to 4.1 cm, previously 4.2 cm. 2. Redemonstrated gastric wall thickening as seen on prior exams. 3. Fluid-filled nondilated small bowel without obstruction or bowel wall thickening. Electronically Signed   By: Esmeralda Hedge M.D.   On: 07/28/2023 23:05   DG Chest 2 View Result Date: 07/28/2023 CLINICAL DATA:  Fever. EXAM: CHEST - 2 VIEW COMPARISON:  07/01/2023, CT 03/11/2023 FINDINGS: The cardiomediastinal contours are normal. Right apical blebs. Pulmonary vasculature is normal. No consolidation, pleural effusion, or pneumothorax. No acute osseous abnormalities are seen. IMPRESSION: 1. No acute chest findings. 2. Right apical blebs. Electronically Signed   By: Chadwick Colonel M.D.   On: 07/28/2023 21:58     TODAY-DAY OF DISCHARGE:  Subjective:   Okley Magnussen today has no headache,no chest abdominal pain,no new weakness tingling or numbness, feels much better wants to go home today.   Objective:   Blood pressure (!) 128/90, pulse 65, temperature (!) 97.3 F (36.3 C), temperature source Oral, resp. rate 14, height 5\' 8"   (1.727 m), weight 56.7 kg, SpO2 100%.  Intake/Output Summary (Last 24 hours) at 08/21/2023 1409 Last data filed at 08/20/2023 2215 Gross per 24 hour  Intake 680 ml  Output --  Net 680 ml   Filed Weights   08/10/23 0959 08/12/23 0600  Weight: 59 kg 56.7 kg    Exam: Awake Alert, Oriented *3, No new F.N deficits, Normal affect [place].AT,PERRAL Supple Neck,No JVD, No cervical lymphadenopathy appriciated.  Symmetrical Chest wall movement, Good air movement bilaterally, CTAB RRR,No Gallops,Rubs or new Murmurs, No Parasternal Heave +ve B.Sounds, Abd Soft, Non tender, No organomegaly appriciated, No rebound -guarding or rigidity. No Cyanosis, Clubbing or edema, No new Rash or bruise   PERTINENT RADIOLOGIC STUDIES: No results found.   PERTINENT  LAB RESULTS: CBC: No results for input(s): "WBC", "HGB", "HCT", "PLT" in the last 72 hours. CMET CMP     Component Value Date/Time   NA 136 08/21/2023 0415   NA 138 05/22/2020 1451   K 4.6 08/21/2023 0415   CL 103 08/21/2023 0415   CO2 22 08/21/2023 0415   GLUCOSE 143 (H) 08/21/2023 0415   BUN 13 08/21/2023 0415   BUN 14 05/22/2020 1451   CREATININE 0.89 08/21/2023 0415   CREATININE 0.65 11/08/2012 1226   CALCIUM  9.7 08/21/2023 0415   PROT 6.9 08/13/2023 1001   PROT 7.5 05/22/2020 1451   ALBUMIN  3.4 (L) 08/13/2023 1001   ALBUMIN  4.7 05/22/2020 1451   AST 77 (H) 08/13/2023 1001   ALT 33 08/13/2023 1001   ALKPHOS 83 08/13/2023 1001   BILITOT 0.5 08/13/2023 1001   BILITOT 0.3 05/22/2020 1451   GFR 104.84 11/30/2022 1055   GFRNONAA >60 08/21/2023 0415   GFRNONAA >89 11/08/2012 1226    GFR Estimated Creatinine Clearance: 76.1 mL/min (by C-G formula based on SCr of 0.89 mg/dL). No results for input(s): "LIPASE", "AMYLASE" in the last 72 hours. No results for input(s): "CKTOTAL", "CKMB", "CKMBINDEX", "TROPONINI" in the last 72 hours. Invalid input(s): "POCBNP" No results for input(s): "DDIMER" in the last 72 hours. No results for  input(s): "HGBA1C" in the last 72 hours. No results for input(s): "CHOL", "HDL", "LDLCALC", "TRIG", "CHOLHDL", "LDLDIRECT" in the last 72 hours. No results for input(s): "TSH", "T4TOTAL", "T3FREE", "THYROIDAB" in the last 72 hours.  Invalid input(s): "FREET3" No results for input(s): "VITAMINB12", "FOLATE", "FERRITIN", "TIBC", "IRON", "RETICCTPCT" in the last 72 hours. Coags: No results for input(s): "INR" in the last 72 hours.  Invalid input(s): "PT" Microbiology: Recent Results (from the past 240 hours)  MRSA Next Gen by PCR, Nasal     Status: None   Collection Time: 08/11/23  8:02 PM   Specimen: Nasal Mucosa; Nasal Swab  Result Value Ref Range Status   MRSA by PCR Next Gen NOT DETECTED NOT DETECTED Final    Comment: (NOTE) The GeneXpert MRSA Assay (FDA approved for NASAL specimens only), is one component of a comprehensive MRSA colonization surveillance program. It is not intended to diagnose MRSA infection nor to guide or monitor treatment for MRSA infections. Test performance is not FDA approved in patients less than 78 years old. Performed at [facility], [address] [place], [place] [address]     FURTHER DISCHARGE INSTRUCTIONS:  Get Medicines reviewed and adjusted: Please take all your medications with you for your next visit with your Primary MD  Laboratory/radiological data: Please request your Primary MD to go over all hospital tests and procedure/radiological results at the follow up, please ask your Primary MD to get all Hospital records sent to his/her office.  In some cases, they will be blood work, cultures and biopsy results pending at the time of your discharge. Please request that your primary care M.D. goes through all the records of your hospital data and follows up on these results.  Also Note the following: If you experience worsening of your admission symptoms, develop shortness of breath, life threatening emergency, suicidal or homicidal  thoughts you must seek medical attention immediately by calling 911 or calling your MD immediately  if symptoms less severe.  You must read complete instructions/literature along with all the possible adverse reactions/side effects for all the Medicines you take and that have been prescribed to you. Take any new Medicines after you have completely  understood and accpet all the possible adverse reactions/side effects.   Do not drive when taking Pain medications or sleeping medications (Benzodaizepines)  Do not take more than prescribed Pain, Sleep and Anxiety Medications. It is not advisable to combine anxiety,sleep and pain medications without talking with your primary care practitioner  Special Instructions: If you have smoked or chewed Tobacco  in the last 2 yrs please stop smoking, stop any regular Alcohol   and or any Recreational drug use.  Wear Seat belts while driving.  Please note: You were cared for by a hospitalist during your hospital stay. Once you are discharged, your primary care physician will handle any further medical issues. Please note that NO REFILLS for any discharge medications will be authorized once you are discharged, as it is imperative that you return to your primary care physician (or establish a relationship with a primary care physician if you do not have one) for your post hospital discharge needs so that they can reassess your need for medications and monitor your lab values.  Total Time spent coordinating discharge including counseling, education and face to face time equals greater than 30 minutes.  SignedKimberly Penna 08/21/2023 2:09 PM

## 2023-08-21 NOTE — Progress Notes (Signed)
 Reviewed AVS, patient expressed understanding of medications, MD follow up reviewed.   IV medications still running.

## 2023-08-21 NOTE — Telephone Encounter (Signed)
 Pharmacy Patient Advocate Encounter   Received notification from Inpatient Request that prior authorization for Lokelma 10GM packets is required/requested.   Insurance verification completed.   The patient is insured through [facility] .   Per test claim: PA required; PA submitted to above mentioned insurance via CoverMyMeds Key/confirmation #/EOC [id number] Status is pending

## 2023-08-22 ENCOUNTER — Other Ambulatory Visit (HOSPITAL_COMMUNITY): Payer: Self-pay

## 2023-08-22 ENCOUNTER — Telehealth (INDEPENDENT_AMBULATORY_CARE_PROVIDER_SITE_OTHER): Payer: Self-pay | Admitting: Primary Care

## 2023-08-22 ENCOUNTER — Telehealth (INDEPENDENT_AMBULATORY_CARE_PROVIDER_SITE_OTHER): Payer: Self-pay

## 2023-08-22 NOTE — Transitions of Care (Post Inpatient/ED Visit) (Unsigned)
   08/22/2023  Name: Norwood Quezada MRN: [id number] DOB: 04/01/1971  Today's TOC FU Call Status: Today's TOC FU Call Status:: Unsuccessful Call (1st Attempt) Unsuccessful Call (1st Attempt) Date: 08/22/23  Attempted to reach the patient regarding the most recent Inpatient/ED visit.  Follow Up Plan: Additional outreach attempts will be made to reach the patient to complete the Transitions of Care (Post Inpatient/ED visit) call.   Signature Darrall Ellison, LPN [facility] Nurse Health Advisor Direct Dial [phone]

## 2023-08-22 NOTE — Telephone Encounter (Signed)
 Left VM with pt about their upcoming appt.

## 2023-08-23 ENCOUNTER — Other Ambulatory Visit (HOSPITAL_COMMUNITY): Payer: Self-pay

## 2023-08-23 ENCOUNTER — Ambulatory Visit (INDEPENDENT_AMBULATORY_CARE_PROVIDER_SITE_OTHER): Payer: MEDICAID | Admitting: Primary Care

## 2023-08-23 NOTE — Transitions of Care (Post Inpatient/ED Visit) (Signed)
 08/23/2023  Name: [name] MRN: [id number] DOB: 1971-03-13  Today's TOC FU Call Status: Today's TOC FU Call Status:: Successful TOC FU Call Completed Unsuccessful Call (1st Attempt) Date: 08/22/23 [facility] FU Call Complete Date: 08/23/23 Patient's Name and Date of Birth confirmed.  Transition Care Management Follow-up Telephone Call Date of Discharge: 08/21/23 Discharge Facility: [facility]) Type of Discharge: Inpatient Admission Primary Inpatient Discharge Diagnosis:: AKF How have you been since you were released from the hospital?: Better Any questions or concerns?: Yes Patient Questions/Concerns:: unable to get approval for K+ Rx Patient Questions/Concerns Addressed: Notified Provider of Patient Questions/Concerns  Items Reviewed: Did you receive and understand the discharge instructions provided?: Yes Medications obtained,verified, and reconciled?: Yes (Medications Reviewed) Any new allergies since your discharge?: No Dietary orders reviewed?: Yes Do you have support at home?: Yes People in Home [RPT]: parent(s)  Medications Reviewed Today: Medications Reviewed Today     Reviewed by Karena Addison, LPN (Licensed Practical Nurse) on 08/23/23 at 1141  Med List Status: <None>   Medication Order Taking? Sig Documenting Provider Last Dose Status Informant  acetaminophen (TYLENOL) 500 MG tablet [id number] Yes Take 1,000 mg by mouth daily as needed for mild pain (pain score 1-3) or moderate pain (pain score 4-6). [provider] Taking Active Self, Pharmacy Records   Patient not taking:   Discontinued 08/08/19 2041 bismuth subsalicylate (KAOPECTATE) 262 MG/15ML suspension [id number] Yes Take 15 mLs by mouth daily as needed for indigestion or diarrhea or loose stools. [provider] Taking Active Self, Pharmacy Records  dicyclomine (BENTYL) 20 MG tablet [id number] Yes Take 1 tablet (20 mg total) by mouth daily as needed for spasms. Maretta Bees,  MD Taking Active   famotidine (PEPCID) 40 MG tablet [id number] Yes Take 1 tablet (40 mg total) by mouth daily. Maretta Bees, MD Taking Active   fluticasone [facility]) 50 MCG/ACT nasal spray [id number] Yes Place 2 sprays into both nostrils daily as needed for allergies. [provider] Taking Active Self, Pharmacy Records  folic acid (FOLVITE) 1 MG tablet [id number] Yes Take 1 tablet (1 mg total) by mouth daily. Maretta Bees, MD Taking Active   lidocaine (XYLOCAINE) 2 % solution [id number] Yes Swish and Spit 15 mLs in the mouth or throat every 4 (four) hours as needed for mouth pain. Maretta Bees, MD Taking Active   lipase/protease/amylase (CREON) 36000 UNITS CPEP capsule [id number] Yes Take 2 capsules (72,000 Units total) by mouth 3 (three) times daily with meals. May also take 1 capsule (36,000 Units total) as needed (with snacks). Maretta Bees, MD Taking Active   loperamide (IMODIUM) 2 MG capsule [id number] Yes Take 1 capsule (2 mg total) by mouth as needed for diarrhea or loose stools. Rodolph Bong, MD Taking Active Self, Pharmacy Records  magnesium oxide (MAG-OX) 400 (240 Mg) MG tablet [id number] Yes Take 1 tablet (400 mg total) by mouth daily. Maretta Bees, MD Taking Active   metoprolol (TOPROL-XL) 200 MG 24 hr tablet [id number] Yes Take 1 tablet (200 mg total) by mouth daily. Maretta Bees, MD Taking Active   Multiple Vitamin (MULTIVITAMIN WITH MINERALS) TABS tablet [id number] Yes Take 1 tablet by mouth daily. Maretta Bees, MD Taking Active   ondansetron (ZOFRAN-ODT) 4 MG disintegrating tablet [id number] Yes Take 1 tablet (4 mg total) by mouth daily as needed for nausea or vomiting. Maretta Bees, MD Taking Active   oxyCODONE (OXY IR/ROXICODONE) 5 MG immediate release tablet [id number] Yes Take 1  tablet (5 mg total) by mouth every 8 (eight) hours as needed for moderate pain (pain score 4-6). Burton Casey, MD Taking Active   psyllium  (HYDROCIL/METAMUCIL) 95 % PACK [id number] Yes Take 1 packet by mouth daily. Ghimire, Estil Heman, MD Taking Active   sodium zirconium cyclosilicate (LOKELMA) 10 g PACK packet [id number] Yes Take 10 g by mouth 2 (two) times daily. Burton Casey, MD Taking Active   sucralfate (CARAFATE) 1 g tablet [id number] Yes Take 1 tablet (1 g total) by mouth in the morning, at noon, and at bedtime. Burton Casey, MD Taking Active   tamsulosin (FLOMAX) 0.4 MG CAPS capsule [id number] Yes Take 1 capsule (0.4 mg total) by mouth daily. Burton Casey, MD Taking Active   thiamine (VITAMIN B1) 100 MG tablet [id number] Yes Take 1 tablet (100 mg total) by mouth daily. Burton Casey, MD Taking Active             Home Care and Equipment/Supplies: Were Home Health Services Ordered?: NA Any new equipment or medical supplies ordered?: NA  Functional Questionnaire: Do you need assistance with bathing/showering or dressing?: No Do you need assistance with meal preparation?: No Do you need assistance with eating?: No Do you have difficulty maintaining continence: No Do you need assistance with getting out of bed/getting out of a chair/moving?: No Do you have difficulty managing or taking your medications?: No  Follow up appointments reviewed: PCP Follow-up appointment confirmed?: Yes Date of PCP follow-up appointment?: 08/31/23 Follow-up Provider: [facility] Follow-up appointment confirmed?: No Reason Specialist Follow-Up Not Confirmed: Patient has Specialist Provider Number and will Call for Appointment Do you need transportation to your follow-up appointment?: No Do you understand care options if your condition(s) worsen?: Yes-patient verbalized understanding    SIGNATURE Darrall Ellison, LPN [facility] Nurse Health Advisor Direct Dial [phone]

## 2023-08-23 NOTE — Telephone Encounter (Signed)
 Noted.

## 2023-08-24 ENCOUNTER — Other Ambulatory Visit (HOSPITAL_COMMUNITY): Payer: Self-pay

## 2023-08-25 ENCOUNTER — Other Ambulatory Visit (HOSPITAL_COMMUNITY): Payer: Self-pay

## 2023-08-25 ENCOUNTER — Other Ambulatory Visit: Payer: Self-pay | Admitting: Internal Medicine

## 2023-08-25 NOTE — Telephone Encounter (Signed)
 Appeal has been submitted. Will advise when response is received or follow up in 1 week. Please be advised that most companies may take 30 days to make a decision. expediated appeal case ID [id number] know in 24 hours

## 2023-08-25 NOTE — Telephone Encounter (Signed)
 Pharmacy Patient Advocate Encounter  Received notification from [facility] that Prior Authorization for Lokelma  10GM packets  has been DENIED.  Full denial letter will be uploaded to the media tab. See denial reason below.   PA #/Case ID/Reference #: [id number]

## 2023-08-26 ENCOUNTER — Emergency Department (HOSPITAL_COMMUNITY)
Admission: EM | Admit: 2023-08-26 | Discharge: 2023-08-26 | Disposition: A | Discharge status: Home / Self Care | Payer: MEDICAID | Attending: Emergency Medicine | Admitting: Emergency Medicine

## 2023-08-26 ENCOUNTER — Emergency Department (HOSPITAL_COMMUNITY): Payer: MEDICAID

## 2023-08-26 ENCOUNTER — Other Ambulatory Visit: Payer: Self-pay

## 2023-08-26 ENCOUNTER — Encounter (HOSPITAL_COMMUNITY): Payer: Self-pay

## 2023-08-26 DIAGNOSIS — R079 Chest pain, unspecified: Secondary | ICD-10-CM | POA: Diagnosis present

## 2023-08-26 DIAGNOSIS — G20C Parkinsonism, unspecified: Secondary | ICD-10-CM | POA: Diagnosis not present

## 2023-08-26 DIAGNOSIS — Z91040 Latex allergy status: Secondary | ICD-10-CM | POA: Insufficient documentation

## 2023-08-26 DIAGNOSIS — K860 Alcohol-induced chronic pancreatitis: Secondary | ICD-10-CM | POA: Diagnosis not present

## 2023-08-26 DIAGNOSIS — E875 Hyperkalemia: Secondary | ICD-10-CM | POA: Insufficient documentation

## 2023-08-26 LAB — CBC
HCT: 38 % — ABNORMAL LOW (ref 39.0–52.0)
Hemoglobin: 11.9 g/dL — ABNORMAL LOW (ref 13.0–17.0)
MCH: 27.1 pg (ref 26.0–34.0)
MCHC: 31.3 g/dL (ref 30.0–36.0)
MCV: 86.6 fL (ref 80.0–100.0)
Platelets: 308 10*3/uL (ref 150–400)
RBC: 4.39 MIL/uL (ref 4.22–5.81)
RDW: 16.8 % — ABNORMAL HIGH (ref 11.5–15.5)
WBC: 8.7 10*3/uL (ref 4.0–10.5)
nRBC: 0 % (ref 0.0–0.2)

## 2023-08-26 LAB — LIPASE, BLOOD: Lipase: 24 U/L (ref 11–51)

## 2023-08-26 LAB — URINALYSIS, ROUTINE W REFLEX MICROSCOPIC
Bilirubin Urine: NEGATIVE
Glucose, UA: NEGATIVE mg/dL
Hgb urine dipstick: NEGATIVE
Ketones, ur: NEGATIVE mg/dL
Leukocytes,Ua: NEGATIVE
Nitrite: NEGATIVE
Protein, ur: NEGATIVE mg/dL
Specific Gravity, Urine: 1.021 (ref 1.005–1.030)
pH: 5 (ref 5.0–8.0)

## 2023-08-26 LAB — BASIC METABOLIC PANEL WITH GFR
Anion gap: 12 (ref 5–15)
BUN: 13 mg/dL (ref 6–20)
CO2: 17 mmol/L — ABNORMAL LOW (ref 22–32)
Calcium: 8.9 mg/dL (ref 8.9–10.3)
Chloride: 109 mmol/L (ref 98–111)
Creatinine, Ser: 0.95 mg/dL (ref 0.61–1.24)
GFR, Estimated: >60 mL/min (ref >60)
Glucose, Bld: 106 mg/dL — ABNORMAL HIGH (ref 70–99)
Potassium: 5.8 mmol/L — ABNORMAL HIGH (ref 3.5–5.1)
Sodium: 138 mmol/L (ref 135–145)

## 2023-08-26 LAB — TROPONIN I (HIGH SENSITIVITY): Troponin I (High Sensitivity): 4 ng/L (ref <18)

## 2023-08-26 MED ORDER — CALCIUM GLUCONATE 10 % IV SOLN
1.0000 g | Freq: Once | INTRAVENOUS | Status: AC
  Administered 2023-08-26: 1 g via INTRAVENOUS
  Filled 2023-08-26: qty 10

## 2023-08-26 MED ORDER — ONDANSETRON 4 MG PO TBDP
4.0000 mg | ORAL_TABLET | Freq: Once | ORAL | Status: AC
Start: 2023-08-26 — End: 2023-08-26
  Administered 2023-08-26: 4 mg via ORAL
  Filled 2023-08-26: qty 1

## 2023-08-26 MED ORDER — SODIUM POLYSTYRENE SULFONATE 15 GM/60ML CO SUSP: 30.0000 g | Freq: Once | 0 refills | Status: AC

## 2023-08-26 MED ORDER — MORPHINE SULFATE 15 MG PO TABS: 15.0000 mg | ORAL_TABLET | ORAL | Status: DC | PRN

## 2023-08-26 MED ORDER — LIDOCAINE VISCOUS HCL 2 % MT SOLN
15.0000 mL | Freq: Once | OROMUCOSAL | Status: AC
  Administered 2023-08-26: 15 mL via OROMUCOSAL
  Filled 2023-08-26: qty 15

## 2023-08-26 MED ORDER — OXYCODONE HCL 5 MG PO TABS: 5.0000 mg | ORAL_TABLET | Freq: Three times a day (TID) | ORAL | 0 refills | Status: DC | PRN

## 2023-08-26 MED ORDER — MORPHINE SULFATE (PF) 4 MG/ML IV SOLN
6.0000 mg | Freq: Once | INTRAVENOUS | Status: AC
  Administered 2023-08-26: 6 mg via INTRAVENOUS
  Filled 2023-08-26: qty 2

## 2023-08-26 MED ORDER — SODIUM BICARBONATE 8.4 % IV SOLN
50.0000 meq | Freq: Once | INTRAVENOUS | Status: AC
  Administered 2023-08-26: 50 meq via INTRAVENOUS
  Filled 2023-08-26: qty 50

## 2023-08-26 MED ORDER — FUROSEMIDE 10 MG/ML IJ SOLN
40.0000 mg | Freq: Once | INTRAMUSCULAR | Status: AC
  Administered 2023-08-26: 40 mg via INTRAVENOUS

## 2023-08-26 MED ORDER — LIDOCAINE VISCOUS HCL 2 % MT SOLN: 15.0000 mL | Freq: Two times a day (BID) | OROMUCOSAL | 0 refills | Status: DC | PRN

## 2023-08-26 MED ORDER — SODIUM ZIRCONIUM CYCLOSILICATE 10 G PO PACK
10.0000 g | PACK | Freq: Once | ORAL | Status: AC
  Administered 2023-08-26: 10 g via ORAL
  Filled 2023-08-26: qty 1

## 2023-08-26 MED ORDER — SODIUM CHLORIDE 0.9 % IV BOLUS
500.0000 mL | Freq: Once | INTRAVENOUS | Status: AC
  Administered 2023-08-26: 500 mL via INTRAVENOUS

## 2023-08-26 NOTE — ED Provider Notes (Signed)
 [place] [facility] Provider Note   CSN: [id number] Arrival date & time: 08/26/23  1013     History  Chief Complaint  Patient presents with   Chest Pain   Shortness of Breath    Dayne Tramell Piechota is a 53 y.o. male.  HPI     53 year old male comes in with chief complaint of chest pain, epigastric abdominal pain, shortness of breath.  Patient actually Onalee Bien that he is having primarily pain over his epigastric region.  The pain radiates up towards his chest.  This is often a marker for him that his potassium is high.  He is also noted some shortness of breath with exertion.  Patient has known history of polysubstance abuse including cocaine, alcohol .  He has history of pancreatitis and Wolf Parkinson's White's disease and paroxysmal A-fib.  Patient does admit to having 1 can of beer yesterday.  He has not used any cocaine for over 3 weeks now.  Currently there is no chest pain.  Home Medications Prior to Admission medications   Medication Sig Start Date End Date Taking? Authorizing Provider  acetaminophen  (TYLENOL ) 500 MG tablet Take 1,000 mg by mouth daily as needed for mild pain (pain score 1-3) or moderate pain (pain score 4-6).   Yes [provider]  bismuth  subsalicylate (KAOPECTATE) 262 MG/15ML suspension Take 15 mLs by mouth daily as needed for indigestion or diarrhea or loose stools.   Yes [provider]  famotidine  (PEPCID ) 40 MG tablet Take 1 tablet (40 mg total) by mouth daily. 08/21/23  Yes Ghimire, Estil Heman, MD  fluticasone  (FLONASE ) 50 MCG/ACT nasal spray Place 2 sprays into both nostrils daily as needed for allergies.   Yes [provider]  folic acid  (FOLVITE ) 1 MG tablet Take 1 tablet (1 mg total) by mouth daily. 08/21/23  Yes Ghimire, Estil Heman, MD  lidocaine  (XYLOCAINE ) 2 % solution Use as directed 15 mLs in the mouth or throat every 12 (twelve) hours as needed. 08/26/23  Yes Deatra Face, MD   lipase/protease/amylase (CREON ) 36000 UNITS CPEP capsule Take 2 capsules (72,000 Units total) by mouth 3 (three) times daily with meals. May also take 1 capsule (36,000 Units total) as needed (with snacks). 08/21/23  Yes Ghimire, Estil Heman, MD  loperamide  (IMODIUM ) 2 MG capsule Take 1 capsule (2 mg total) by mouth as needed for diarrhea or loose stools. 03/07/23  Yes Armenta Landau, MD  magnesium  oxide (MAG-OX) 400 (240 Mg) MG tablet Take 1 tablet (400 mg total) by mouth daily. 08/21/23  Yes Ghimire, Estil Heman, MD  metoprolol  (TOPROL -XL) 200 MG 24 hr tablet Take 1 tablet (200 mg total) by mouth daily. 08/21/23  Yes Ghimire, Estil Heman, MD  Multiple Vitamin (MULTIVITAMIN WITH MINERALS) TABS tablet Take 1 tablet by mouth daily. 08/21/23  Yes Ghimire, Estil Heman, MD  ondansetron  (ZOFRAN -ODT) 4 MG disintegrating tablet Take 1 tablet (4 mg total) by mouth daily as needed for nausea or vomiting. 08/21/23  Yes Ghimire, Estil Heman, MD  psyllium (HYDROCIL/METAMUCIL) 95 % PACK Take 1 packet by mouth daily. 08/21/23  Yes Ghimire, Estil Heman, MD  sodium polystyrene (KAYEXALATE ) 15 GM/60ML suspension Take 120 mLs (30 g total) by mouth once for 1 dose. 08/26/23 08/26/23 Yes Deatra Face, MD  sucralfate  (CARAFATE ) 1 g tablet Take 1 tablet (1 g total) by mouth in the morning, at noon, and at bedtime. 08/21/23  Yes Ghimire, Estil Heman, MD  tamsulosin  (FLOMAX ) 0.4 MG CAPS capsule Take  1 capsule (0.4 mg total) by mouth daily. 08/21/23  Yes Ghimire, Estil Heman, MD  thiamine  (VITAMIN B1) 100 MG tablet Take 1 tablet (100 mg total) by mouth daily. 08/21/23  Yes Ghimire, Estil Heman, MD  dicyclomine  (BENTYL ) 20 MG tablet Take 1 tablet (20 mg total) by mouth daily as needed for spasms. Patient not taking: Reported on 08/26/2023 08/21/23   Burton Casey, MD  oxyCODONE  (OXY IR/ROXICODONE ) 5 MG immediate release tablet Take 1 tablet (5 mg total) by mouth every 8 (eight) hours as needed for moderate pain (pain score 4-6). 08/26/23    Deatra Face, MD  sodium zirconium cyclosilicate  (LOKELMA ) 10 g PACK packet Take 10 g by mouth 2 (two) times daily. Patient not taking: Reported on 08/26/2023 08/21/23   Burton Casey, MD  amitriptyline  (ELAVIL ) 25 MG tablet Take 1 tablet (25 mg total) by mouth at bedtime. Patient not taking: Reported on 08/08/2019 10/15/18 08/08/19  Kraig Peru, MD      Allergies    Robaxin  Ronell Coe ], Aspirin , Shellfish-derived products, Trazodone  and nefazodone, Adhesive [tape], Fish-derived products, Latex, Toradol  [ketorolac  tromethamine ], Contrast media [iodinated contrast media], Reglan  [metoclopramide ], and Clent Czar oil]    Review of Systems   Review of Systems  All other systems reviewed and are negative.   Physical Exam Updated Vital Signs BP 139/85   Pulse 78   Temp 98.5 F (36.9 C) (Oral)   Resp 19   Ht 5\' 8"  (1.727 m)   Wt 56.7 kg   SpO2 100%   BMI 19.01 kg/m  Physical Exam Vitals and nursing note reviewed.  Constitutional:      Appearance: He is well-developed.  HENT:     Head: Atraumatic.  Cardiovascular:     Rate and Rhythm: Normal rate.     Pulses:          Radial pulses are 2+ on the right side and 2+ on the left side.     Heart sounds: Normal heart sounds.  Pulmonary:     Effort: Pulmonary effort is normal.  Musculoskeletal:     Cervical back: Neck supple.     Right lower leg: No edema.     Left lower leg: No edema.  Skin:    General: Skin is warm.  Neurological:     Mental Status: He is alert and oriented to person, place, and time.     ED Results / Procedures / Treatments   Labs (all labs ordered are listed, but only abnormal results are displayed) Labs Reviewed  BASIC METABOLIC PANEL WITH GFR - Abnormal; Notable for the following components:      Result Value   Potassium 5.8 (*)    CO2 17 (*)    Glucose, Bld 106 (*)    All other components within normal limits  CBC - Abnormal; Notable for the following components:   Hemoglobin 11.9 (*)     HCT 38.0 (*)    RDW 16.8 (*)    All other components within normal limits  LIPASE, BLOOD  URINALYSIS, ROUTINE W REFLEX MICROSCOPIC  TROPONIN I (HIGH SENSITIVITY)  TROPONIN I (HIGH SENSITIVITY)    EKG EKG Interpretation Date/Time:  Saturday August 26 2023 10:35:29 EDT Ventricular Rate:  72 PR Interval:  104 QRS Duration:  80 QT Interval:  362 QTC Calculation: 396 R Axis:   61  Text Interpretation: Sinus rhythm with short PR ST & T wave abnormality, consider lateral ischemia Abnormal ECG When compared with ECG of 16-Aug-2023 10:17, PREVIOUS  ECG IS PRESENT new twi in the lateral leads Nonspecific ST and T wave abnormality Confirmed by Deatra Face [phone]) on 08/26/2023 11:56:12 AM  Radiology DG Chest 2 View Result Date: 08/26/2023 CLINICAL DATA:  Chest pain and shortness of breath. EXAM: CHEST - 2 VIEW COMPARISON:  08/10/2023 FINDINGS: The heart size and mediastinal contours are within normal limits. Improved expansion of both lungs since the prior chest x-ray. There is no evidence of pulmonary edema, consolidation, pneumothorax, nodule or pleural fluid. The visualized skeletal structures are unremarkable. IMPRESSION: No active cardiopulmonary disease. Electronically Signed   By: Erica Hau M.D.   On: 08/26/2023 13:54    Procedures Procedures    Medications Ordered in ED Medications  morphine  (MSIR) tablet 15 mg (has no administration in time range)  lidocaine  (XYLOCAINE ) 2 % viscous mouth solution 15 mL (has no administration in time range)  furosemide  (LASIX ) injection 40 mg (has no administration in time range)  ondansetron  (ZOFRAN -ODT) disintegrating tablet 4 mg (4 mg Oral Given 08/26/23 1152)  sodium zirconium cyclosilicate  (LOKELMA ) packet 10 g (10 g Oral Given 08/26/23 1233)  calcium  gluconate inj 10% (1 g) URGENT USE ONLY! (1 g Intravenous Given 08/26/23 1233)  sodium bicarbonate  injection 50 mEq (50 mEq Intravenous Given 08/26/23 1233)  sodium chloride  0.9 % bolus  500 mL (0 mLs Intravenous Stopped 08/26/23 1431)  morphine  (PF) 4 MG/ML injection 6 mg (6 mg Intravenous Given 08/26/23 1229)  morphine  (PF) 4 MG/ML injection 6 mg (6 mg Intravenous Given 08/26/23 1455)    ED Course/ Medical Decision Making/ A&P                                 Medical Decision Making Amount and/or Complexity of Data Reviewed Labs: ordered. Radiology: ordered.  Risk Prescription drug management.   This patient presents to the ED with chief complaint(s) of epigastric abdominal pain that is moving towards his chest, exertional shortness of breath with pertinent past medical history of polysubstance use including cocaine use, [employer], A-fib, pancreatitis including pseudocyst.The complaint involves an extensive differential diagnosis and also carries with it a high risk of complications and morbidity.    The differential diagnosis includes  Pancreatitis, complication from pancreatic pseudocyst, hepatobiliary pathology including cholelithiasis and cholecystitis, Gastritis/peptic ulcer disease, small bowel obstruction, Acute coronary syndrome, Aortic Dissection   The initial plan is to get basic labs, including lipase and troponin.   Additional history obtained: Records reviewed previous admission documents and previous imaging including CT chest from 2024 that revealed no evidence of any vascular pathology.  There was evidence of gastritis.  I have also reviewed recent discharge summary from 4-14.  Independent labs interpretation:  The following labs were independently interpreted: Patient's K is 5.8.  Initial troponin is normal.  He does not have any active chest pain, no indication for repeat troponin.  CBC is reassuring.  UA is normal.  Patient's case 5.8.  He told me that when he starts having shortness of breath, pain going up towards his chest, he often thinks of hyperkalemia as a cause.  He was prescribed Lokelma , but the insurance denied it.  He has not  picked up the Kayexalate  that seems like was added once Lokelma  was declined.  ER we will give him a dose of Lokelma . We will also give him calcium  and sodium bicarb. He is receiving some IV fluids given concerns for pancreatitis, which might further help, and  will allow us  to give him Lasix .  Independent visualization and interpretation of imaging: - I independently visualized the following imaging with scope of interpretation limited to determining acute life threatening conditions related to emergency care: X-ray of the chest, which revealed no evidence of pneumonia, pulm edema.  Treatment and Reassessment: Results of the ED workup discussed with the patient.  On reassessment, he states that he felt better after the pain medicine.  He has passed oral challenge.  However, the pain is coming back.  Will give him another round of pain medicine.  I discussed with him his hide K and informed her that Lokelma  was declined.  He was given Kayexalate , but he is unsure about that prescription.  We will give him IV Lasix  here.  We will prescribe him Kayexalate .  He stable for discharge.  Advised that he refrain from alcohol  use.  Patient states that he thinks that gastritis could be contributing.  He is asking for viscous lidocaine  and also some more pain medicine as he has run out.  Will give him 5 tabs of oxycodone .   Consideration for admission or further workup: Admission considered given elevated K, abdominal pain -but patient has passed oral challenge.  K is not profoundly elevated and we will proceed with the same plan that inpatient team was focusing on which is to give him Kayexalate .  Stable for discharge.   Final Clinical Impression(s) / ED Diagnoses Final diagnoses:  Alcohol -induced chronic pancreatitis (HCC)  Hyperkalemia    Rx / DC Orders ED Discharge Orders          Ordered    sodium polystyrene (KAYEXALATE ) 15 GM/60ML suspension   Once        08/26/23 1536    oxyCODONE  (OXY  IR/ROXICODONE ) 5 MG immediate release tablet  Every 8 hours PRN        08/26/23 1538    lidocaine  (XYLOCAINE ) 2 % solution  Every 12 hours PRN        08/26/23 1540              Deatra Face, MD 08/26/23 1551

## 2023-08-26 NOTE — ED Provider Triage Note (Signed)
 Emergency Medicine Provider Triage Evaluation Note  [name] , a 53 y.o. male  was evaluated in triage.  Pt complains of chest pain and shortness of breath.  Patient reports epigastric abdominal tenderness with radiation into the left side of the chest.  Dors is diarrhea started yesterday with multiple days of vomiting today.  Reports similar episode about 2 weeks ago.  Was recently admitted for AKI about 2 weeks ago was discharged home 5 days ago.  Did not report to me any specific alcohol  or substance use.  Review of Systems  Positive: As above Negative: As above  Physical Exam  BP (!) 116/93   Pulse 79   Temp 98.1 F (36.7 C) (Oral)   Resp 16   Ht 5\' 8"  (1.727 m)   Wt 56.7 kg   SpO2 100%   BMI 19.01 kg/m  Gen:   Awake, no distress   Resp:  Normal effort, rhonchorous lung sounds in the upper lung fields, other lung fields unremarkable MSK:   Moves extremities without difficulty  Other:  Tenderness to palpation in the epigastrium  Medical Decision Making  Medically screening exam initiated at 11:03 AM.  Appropriate orders placed.  [name] was informed that the remainder of the evaluation will be completed by another provider, this initial triage assessment does not replace that evaluation, and the importance of remaining in the ED until their evaluation is complete.     Chala Gul A, PA-C 08/26/23 1109

## 2023-08-26 NOTE — ED Notes (Signed)
 Patient transported to X-ray

## 2023-08-26 NOTE — Discharge Instructions (Addendum)
 You are seen in the ER for abdominal pain.  Your potassium was also noted to be slightly high.  Labs are reassuring including lipase, pancreas enzyme.  Take the medicine prescribed for pain. Take viscous lidocaine  as needed. Kayexalate  has been prescribed for elevated potassium. Follow-up with your outpatient doctor for additional care.  Refrain from alcohol  use.

## 2023-08-26 NOTE — ED Triage Notes (Addendum)
 Pt to ED c/o chest pain and SHOB, Abdominal pain that started this morning,EMS did not give ASA documented allergy, no nitroglycerin  given d/t pt took Cialis  Reports similar episode 2 weeks ago.   Hx pancreatitis. Etoh abuse  Last VS: 113/68, 68, 98%ra , cbg 197, RR24 .  #20lfa- Zofran  4mg  given by EMS.

## 2023-08-28 ENCOUNTER — Other Ambulatory Visit (HOSPITAL_COMMUNITY): Payer: Self-pay

## 2023-08-28 ENCOUNTER — Encounter (HOSPITAL_COMMUNITY): Payer: Self-pay | Admitting: Emergency Medicine

## 2023-08-28 ENCOUNTER — Other Ambulatory Visit: Payer: Self-pay

## 2023-08-28 ENCOUNTER — Emergency Department (HOSPITAL_COMMUNITY)
Admission: EM | Admit: 2023-08-28 | Discharge: 2023-08-29 | Disposition: A | Discharge status: Home / Self Care | Payer: MEDICAID | Attending: Emergency Medicine | Admitting: Emergency Medicine

## 2023-08-28 DIAGNOSIS — Z79899 Other long term (current) drug therapy: Secondary | ICD-10-CM | POA: Insufficient documentation

## 2023-08-28 DIAGNOSIS — R1084 Generalized abdominal pain: Secondary | ICD-10-CM | POA: Diagnosis present

## 2023-08-28 DIAGNOSIS — R197 Diarrhea, unspecified: Secondary | ICD-10-CM | POA: Insufficient documentation

## 2023-08-28 DIAGNOSIS — R112 Nausea with vomiting, unspecified: Secondary | ICD-10-CM | POA: Diagnosis not present

## 2023-08-28 DIAGNOSIS — R030 Elevated blood-pressure reading, without diagnosis of hypertension: Secondary | ICD-10-CM

## 2023-08-28 DIAGNOSIS — I10 Essential (primary) hypertension: Secondary | ICD-10-CM | POA: Diagnosis not present

## 2023-08-28 DIAGNOSIS — Z91040 Latex allergy status: Secondary | ICD-10-CM | POA: Diagnosis not present

## 2023-08-28 DIAGNOSIS — J45909 Unspecified asthma, uncomplicated: Secondary | ICD-10-CM | POA: Insufficient documentation

## 2023-08-28 LAB — COMPREHENSIVE METABOLIC PANEL WITH GFR
ALT: 34 U/L (ref 0–44)
AST: 45 U/L — ABNORMAL HIGH (ref 15–41)
Albumin: 4.1 g/dL (ref 3.5–5.0)
Alkaline Phosphatase: 72 U/L (ref 38–126)
Anion gap: 12 (ref 5–15)
BUN: 10 mg/dL (ref 6–20)
CO2: 18 mmol/L — ABNORMAL LOW (ref 22–32)
Calcium: 9 mg/dL (ref 8.9–10.3)
Chloride: 105 mmol/L (ref 98–111)
Creatinine, Ser: 1 mg/dL (ref 0.61–1.24)
GFR, Estimated: >60 mL/min (ref >60)
Glucose, Bld: 106 mg/dL — ABNORMAL HIGH (ref 70–99)
Potassium: 4.1 mmol/L (ref 3.5–5.1)
Sodium: 135 mmol/L (ref 135–145)
Total Bilirubin: 0.5 mg/dL (ref 0.0–1.2)
Total Protein: 7.9 g/dL (ref 6.5–8.1)

## 2023-08-28 LAB — URINALYSIS, ROUTINE W REFLEX MICROSCOPIC
Bilirubin Urine: NEGATIVE
Glucose, UA: NEGATIVE mg/dL
Hgb urine dipstick: NEGATIVE
Ketones, ur: NEGATIVE mg/dL
Leukocytes,Ua: NEGATIVE
Nitrite: NEGATIVE
Protein, ur: NEGATIVE mg/dL
Specific Gravity, Urine: 1.004 — ABNORMAL LOW (ref 1.005–1.030)
pH: 5 (ref 5.0–8.0)

## 2023-08-28 LAB — CBC WITH DIFFERENTIAL/PLATELET
Abs Immature Granulocytes: 0.02 10*3/uL (ref 0.00–0.07)
Basophils Absolute: 0 10*3/uL (ref 0.0–0.1)
Basophils Relative: 0 %
Eosinophils Absolute: 0.3 10*3/uL (ref 0.0–0.5)
Eosinophils Relative: 3 %
HCT: 37.3 % — ABNORMAL LOW (ref 39.0–52.0)
Hemoglobin: 11.7 g/dL — ABNORMAL LOW (ref 13.0–17.0)
Immature Granulocytes: 0 %
Lymphocytes Relative: 28 %
Lymphs Abs: 2.4 10*3/uL (ref 0.7–4.0)
MCH: 27.5 pg (ref 26.0–34.0)
MCHC: 31.4 g/dL (ref 30.0–36.0)
MCV: 87.6 fL (ref 80.0–100.0)
Monocytes Absolute: 0.8 10*3/uL (ref 0.1–1.0)
Monocytes Relative: 9 %
Neutro Abs: 5.1 10*3/uL (ref 1.7–7.7)
Neutrophils Relative %: 60 %
Platelets: 262 10*3/uL (ref 150–400)
RBC: 4.26 MIL/uL (ref 4.22–5.81)
RDW: 17.1 % — ABNORMAL HIGH (ref 11.5–15.5)
WBC: 8.6 10*3/uL (ref 4.0–10.5)
nRBC: 0 % (ref 0.0–0.2)

## 2023-08-28 LAB — LIPASE, BLOOD: Lipase: 23 U/L (ref 11–51)

## 2023-08-28 LAB — MAGNESIUM: Magnesium: 1.6 mg/dL — ABNORMAL LOW (ref 1.7–2.4)

## 2023-08-28 MED ORDER — ALUM & MAG HYDROXIDE-SIMETH 200-200-20 MG/5ML PO SUSP
30.0000 mL | Freq: Once | ORAL | Status: AC
  Administered 2023-08-28: 30 mL via ORAL
  Filled 2023-08-28: qty 30

## 2023-08-28 MED ORDER — SODIUM CHLORIDE 0.9 % IV BOLUS
1000.0000 mL | Freq: Once | INTRAVENOUS | Status: AC
  Administered 2023-08-28: 1000 mL via INTRAVENOUS

## 2023-08-28 MED ORDER — HYDROMORPHONE HCL 1 MG/ML IJ SOLN
1.0000 mg | Freq: Once | INTRAMUSCULAR | Status: AC
  Administered 2023-08-28: 1 mg via INTRAVENOUS
  Filled 2023-08-28: qty 1

## 2023-08-28 NOTE — Telephone Encounter (Signed)
 Pharmacy Patient Advocate Encounter  Received notification from [facility] that Prior Authorization for Lokelma  10GM packets  has been APPROVED from 08/25/2023 to 08/24/2024

## 2023-08-28 NOTE — ED Triage Notes (Signed)
 Pt BIB by EMS for ABD pain x 2 days. Endorses N/V. No meds given en route.  EMS VS BP 150/86 HR 86 CBG 125

## 2023-08-28 NOTE — ED Provider Triage Note (Signed)
 Emergency Medicine Provider Triage Evaluation Note  [name] , a 53 y.o. male  was evaluated in triage.  Pt complains of abdominal pain, fatigue, vomiting and diarrhea.  Hx of chronic pancreatitis, recent hospitalization this month Hx of hyperkalemia as well and low Mg Discharged 4/14 from hospital  Review of Systems  Positive: Nausea, vomiting, diarrhea Negative: Fever, LOC  Physical Exam  There were no vitals taken for this visit. Gen:   Awake, no distress   Resp:  Normal effort  MSK:   Moves extremities without difficulty  Other:  Vague epigastric ttp  Medical Decision Making  Medically screening exam initiated at 4:59 PM.  Appropriate orders placed.  [name] was informed that the remainder of the evaluation will be completed by another provider, this initial triage assessment does not replace that evaluation, and the importance of remaining in the ED until their evaluation is complete.  Abdominal pain LFT, lipase, labs ordered Will check K and Mag as well IV pain meds and IV fluids ordered   Glen Kesinger J, MD 08/28/23 1700

## 2023-08-29 MED ORDER — MAGNESIUM OXIDE -MG SUPPLEMENT 400 (240 MG) MG PO TABS
400.0000 mg | ORAL_TABLET | Freq: Once | ORAL | Status: AC
  Administered 2023-08-29: 400 mg via ORAL
  Filled 2023-08-29: qty 1

## 2023-08-29 MED ORDER — ONDANSETRON HCL 4 MG/2ML IJ SOLN
4.0000 mg | Freq: Once | INTRAMUSCULAR | Status: AC
  Administered 2023-08-29: 4 mg via INTRAVENOUS
  Filled 2023-08-29: qty 2

## 2023-08-29 MED ORDER — OXYCODONE HCL 5 MG PO TABS: 5.0000 mg | ORAL_TABLET | Freq: Three times a day (TID) | ORAL | 0 refills | Status: DC | PRN

## 2023-08-29 MED ORDER — MORPHINE SULFATE (PF) 4 MG/ML IV SOLN
4.0000 mg | Freq: Once | INTRAVENOUS | Status: AC
  Administered 2023-08-29: 4 mg via INTRAVENOUS
  Filled 2023-08-29: qty 1

## 2023-08-29 NOTE — ED Provider Notes (Signed)
 [facility] Provider Note   CSN: [id number] Arrival date & time: 08/28/23  1629     History  Chief Complaint  Patient presents with   Abdominal Pain    [name] is a 53 y.o. male with history of chronic pancreatitis, polysubstance abuse, WPW, well-known to this facility presents with abdominal pain for the past couple days.  Associated with nausea, vomiting and diarrhea.  Was seen 3 days ago on 4/19 for similar complaints.  Noted to have hyperkalemia 5.8 at that time.   Abdominal Pain  Past Medical History:  Diagnosis Date   Alcoholism /alcohol  abuse    Anemia    Anxiety    Arthritis    knees; arms; elbows (03/26/2015)   Asthma    Bipolar disorder (HCC)    Chronic bronchitis (HCC)    Chronic lower back pain    Chronic pancreatitis (HCC)    Cocaine abuse (HCC)    Depression    Family history of adverse reaction to anesthesia    Femoral condyle fracture (HCC) 03/08/2014   left medial/notes 03/09/2014   GERD (gastroesophageal reflux disease)    H/O hiatal hernia    H/O suicide attempt 10/2012   High cholesterol    History of blood transfusion 10/2012   when I tried to commit suicide   History of stomach ulcers    Hypertension    Marijuana abuse, continuous    Migraine    a few times/year (03/26/2015)   Pancreatitis    Pneumonia 1990's X 3   PTSD (post-traumatic stress disorder)    Seizures (HCC)    Sickle cell trait (HCC)    WPW (Wolff-Parkinson-White syndrome)    Maximo Spar 03/06/2013       Home Medications Prior to Admission medications   Medication Sig Start Date End Date Taking? Authorizing Provider  acetaminophen  (TYLENOL ) 500 MG tablet Take 1,000 mg by mouth daily as needed for mild pain (pain score 1-3) or moderate pain (pain score 4-6).    [provider]  bismuth  subsalicylate (KAOPECTATE) 262 MG/15ML suspension Take 15 mLs by mouth daily as needed for indigestion or diarrhea or loose  stools.    [provider]  dicyclomine  (BENTYL ) 20 MG tablet Take 1 tablet (20 mg total) by mouth daily as needed for spasms. Patient not taking: Reported on 08/26/2023 08/21/23   Burton Casey, MD  famotidine  (PEPCID ) 40 MG tablet Take 1 tablet (40 mg total) by mouth daily. 08/21/23   Ghimire, Estil Heman, MD  fluticasone  (FLONASE ) 50 MCG/ACT nasal spray Place 2 sprays into both nostrils daily as needed for allergies.    [provider]  folic acid  (FOLVITE ) 1 MG tablet Take 1 tablet (1 mg total) by mouth daily. 08/21/23   Ghimire, Estil Heman, MD  lidocaine  (XYLOCAINE ) 2 % solution Use as directed 15 mLs in the mouth or throat every 12 (twelve) hours as needed. 08/26/23   Deatra Face, MD  lipase/protease/amylase (CREON ) 36000 UNITS CPEP capsule Take 2 capsules (72,000 Units total) by mouth 3 (three) times daily with meals. May also take 1 capsule (36,000 Units total) as needed (with snacks). 08/21/23   Ghimire, Estil Heman, MD  loperamide  (IMODIUM ) 2 MG capsule Take 1 capsule (2 mg total) by mouth as needed for diarrhea or loose stools. 03/07/23   Armenta Landau, MD  magnesium  oxide (MAG-OX) 400 (240 Mg) MG tablet Take 1 tablet (400 mg total) by mouth daily. 08/21/23   Ghimire,  Estil Heman, MD  metoprolol  (TOPROL -XL) 200 MG 24 hr tablet Take 1 tablet (200 mg total) by mouth daily. 08/21/23   Ghimire, Estil Heman, MD  Multiple Vitamin (MULTIVITAMIN WITH MINERALS) TABS tablet Take 1 tablet by mouth daily. 08/21/23   Ghimire, Estil Heman, MD  ondansetron  (ZOFRAN -ODT) 4 MG disintegrating tablet Take 1 tablet (4 mg total) by mouth daily as needed for nausea or vomiting. 08/21/23   Ghimire, Estil Heman, MD  oxyCODONE  (OXY IR/ROXICODONE ) 5 MG immediate release tablet Take 1 tablet (5 mg total) by mouth every 8 (eight) hours as needed for moderate pain (pain score 4-6). 08/29/23   Felicie Horning, PA-C  psyllium (HYDROCIL/METAMUCIL) 95 % PACK Take 1 packet by mouth daily. 08/21/23   Ghimire, Estil Heman, MD  sodium zirconium cyclosilicate  (LOKELMA ) 10 g PACK packet Take 10 g by mouth 2 (two) times daily. Patient not taking: Reported on 08/26/2023 08/21/23   Burton Casey, MD  sucralfate  (CARAFATE ) 1 g tablet Take 1 tablet (1 g total) by mouth in the morning, at noon, and at bedtime. 08/21/23   Ghimire, Estil Heman, MD  tamsulosin  (FLOMAX ) 0.4 MG CAPS capsule Take 1 capsule (0.4 mg total) by mouth daily. 08/21/23   Ghimire, Estil Heman, MD  thiamine  (VITAMIN B1) 100 MG tablet Take 1 tablet (100 mg total) by mouth daily. 08/21/23   Ghimire, Estil Heman, MD  amitriptyline  (ELAVIL ) 25 MG tablet Take 1 tablet (25 mg total) by mouth at bedtime. Patient not taking: Reported on 08/08/2019 10/15/18 08/08/19  Kraig Peru, MD      Allergies    Robaxin  [methocarbamol ], Aspirin , Shellfish-derived products, Trazodone  and nefazodone, Adhesive [tape], Fish-derived products, Latex, Toradol  [ketorolac  tromethamine ], Contrast media [iodinated contrast media], Reglan  [metoclopramide ], and Clent Czar oil]    Review of Systems   Review of Systems  Gastrointestinal:  Positive for abdominal pain.    Physical Exam Updated Vital Signs BP (!) 183/111 (BP Location: Right Arm)   Pulse 83   Temp 98.5 F (36.9 C) (Oral)   Resp 16   Ht 5\' 8"  (1.727 m)   Wt 56.7 kg   SpO2 100%   BMI 19.01 kg/m  Physical Exam Vitals and nursing note reviewed.  Constitutional:      General: He is not in acute distress.    Appearance: He is well-developed.  HENT:     Head: Normocephalic and atraumatic.  Eyes:     Conjunctiva/sclera: Conjunctivae normal.  Cardiovascular:     Rate and Rhythm: Normal rate and regular rhythm.     Heart sounds: No murmur heard. Pulmonary:     Effort: Pulmonary effort is normal. No respiratory distress.     Breath sounds: Normal breath sounds.  Abdominal:     Palpations: Abdomen is soft.     Tenderness: There is abdominal tenderness.     Comments: Mild generalized abdominal tenderness   Musculoskeletal:        General: No swelling.     Cervical back: Neck supple.  Skin:    General: Skin is warm and dry.     Capillary Refill: Capillary refill takes less than 2 seconds.  Neurological:     Mental Status: He is alert.  Psychiatric:        Mood and Affect: Mood normal.     ED Results / Procedures / Treatments   Labs (all labs ordered are listed, but only abnormal results are displayed) Labs Reviewed  COMPREHENSIVE METABOLIC PANEL WITH GFR - Abnormal; Notable  for the following components:      Result Value   CO2 18 (*)    Glucose, Bld 106 (*)    AST 45 (*)    All other components within normal limits  CBC WITH DIFFERENTIAL/PLATELET - Abnormal; Notable for the following components:   Hemoglobin 11.7 (*)    HCT 37.3 (*)    RDW 17.1 (*)    All other components within normal limits  URINALYSIS, ROUTINE W REFLEX MICROSCOPIC - Abnormal; Notable for the following components:   Color, Urine STRAW (*)    Specific Gravity, Urine 1.004 (*)    All other components within normal limits  MAGNESIUM  - Abnormal; Notable for the following components:   Magnesium  1.6 (*)    All other components within normal limits  LIPASE, BLOOD    EKG None  Radiology No results found.  Procedures Procedures    Medications Ordered in ED Medications  HYDROmorphone  (DILAUDID ) injection 1 mg (1 mg Intravenous Given 08/28/23 1723)  sodium chloride  0.9 % bolus 1,000 mL (0 mLs Intravenous Stopped 08/29/23 0642)  alum & mag hydroxide-simeth (MAALOX/MYLANTA) 200-200-20 MG/5ML suspension 30 mL (30 mLs Oral Given 08/28/23 1828)  magnesium  oxide (MAG-OX) tablet 400 mg (400 mg Oral Given 08/29/23 0800)  ondansetron  (ZOFRAN ) injection 4 mg (4 mg Intravenous Given 08/29/23 0727)  morphine  (PF) 4 MG/ML injection 4 mg (4 mg Intravenous Given 08/29/23 0865)    ED Course/ Medical Decision Making/ A&P                                 Medical Decision Making Amount and/or Complexity of Data  Reviewed Labs: ordered.  Risk OTC drugs. Prescription drug management.   This patient presents to the ED with chief complaint(s) of abdominal pain.  The complaint involves an extensive differential diagnosis and also carries with it a high risk of complications and morbidity.   pertinent past medical history as listed in HPI  The differential diagnosis includes  gastroenteritis, colitis, small bowel obstruction, appendicitis, cholecystitis, hepatobiliary pathology, gastritis, PUD, diverticulosis/diverticulitis, pancreatitis, nephrolithiasis   Additional history obtained: Records reviewed Care Everywhere/External Records  Initial Assessment:   Patient presents hypertensive 153/108 with complaints of generalized abdominal pain associated with vomiting and diarrhea.  Patient is well-known to this facility with history of chronic pancreatitis and polysubstance abuse.  On exam he has generalized abdominal tenderness without focality.  CBC without leukocytosis, lipase within normal limits, noted to have mag 1.6, otherwise no significant electrolyte derangement.  UA without significant findings.  BP noted to be elevated today.  Takes metoprolol  at home.  Blood work without any endorgan damage.  No gross neurodeficits on exam.  Will follow-up with PCP.  Independent ECG interpretation:  none  Independent labs interpretation:  The following labs were independently interpreted:  CBC without leukocytosis and stable hemoglobin, CMP with mildly elevated AST, lipase within normal limits, mag 1.6, UA without nitrates, leukocytes or hemoglobin  Independent visualization and interpretation of imaging: None  Treatment and Reassessment: Patient given liter bolus, GI cocktail and Dilaudid  following triage  Upon first assessment patient given morphine , Zofran , give mag  Patient tolerating p.o.  Feels somewhat improved.  Will discharge home with PCP follow-up.  Consultations obtained:    none  Disposition:   Patient be discharged home encouraged follow-up PCP. The patient has been appropriately medically screened and/or stabilized in the ED. I have low suspicion for any other emergent medical condition  which would require further screening, evaluation or treatment in the ED or require inpatient management. At time of discharge the patient is hemodynamically stable and in no acute distress. I have discussed work-up results and diagnosis with patient and answered all questions. Patient is agreeable with discharge plan. We discussed strict return precautions for returning to the emergency department and they verbalized understanding.     Social Determinants of Health:   none  This note was dictated with voice recognition software.  Despite best efforts at proofreading, errors may have occurred which can change the documentation meaning.          Final Clinical Impression(s) / ED Diagnoses Final diagnoses:  Generalized abdominal pain  Nausea vomiting and diarrhea    Rx / DC Orders ED Discharge Orders          Ordered    oxyCODONE  (OXY IR/ROXICODONE ) 5 MG immediate release tablet  Every 8 hours PRN        08/29/23 0835              Felicie Horning, PA-C 08/29/23 1610    Eldon Greenland, MD 08/29/23 2113

## 2023-08-29 NOTE — ED Notes (Signed)
Patient given bus pass upon discharge

## 2023-08-29 NOTE — Discharge Instructions (Addendum)
 Evaluated the emergency room for abdominal pain, nausea vomiting diarrhea.  Your lab work showed a low magnesium .  This was replenished.  Your blood pressure was noted to be elevated during your visit.  Please follow-up with your primary care doctor for further management.

## 2023-08-31 ENCOUNTER — Telehealth: Payer: Self-pay

## 2023-08-31 ENCOUNTER — Telehealth: Payer: Self-pay | Admitting: Primary Care

## 2023-08-31 ENCOUNTER — Ambulatory Visit (INDEPENDENT_AMBULATORY_CARE_PROVIDER_SITE_OTHER): Payer: MEDICAID | Admitting: Primary Care

## 2023-08-31 VITALS — BP 118/84 | HR 91 | Resp 16 | Wt 122.2 lb

## 2023-08-31 DIAGNOSIS — Z09 Encounter for follow-up examination after completed treatment for conditions other than malignant neoplasm: Secondary | ICD-10-CM

## 2023-08-31 DIAGNOSIS — F1010 Alcohol abuse, uncomplicated: Secondary | ICD-10-CM

## 2023-08-31 DIAGNOSIS — R1013 Epigastric pain: Secondary | ICD-10-CM

## 2023-08-31 NOTE — Telephone Encounter (Signed)
 I spoke with the patient briefly while he was at his appointment with Ms Denece Finger, NP this morning.  He is interested in going to a residential treatment facility for his alcohol  use.  He will likely need to be detoxed first and he will also need to arrange transportation to the treatment center.  I told him that I would check with his insurer, [employer], to find out the name of  his tailored care management agency and see how/if they can assist with this placement.   I spoke to Pattie Borders and she informed me that [facility] is the Tailored Care Management Agency: [phone].  I then spoke to [employer] and she said that the patient has yet to be assigned a care manager so she will need to make that request and have the tailored care manager call the patient. I provided her with his phone as well as my phone number.  I then called the patient and explained to him that I spoke to [facility] and [facility] and he should be receiving a call from [facility] about tailored care management services. He said he used to work with [employer], years ago. I explained that they have case workers in the community that may be able to assist him with accessing resources/ transportation. I encouraged him to return any messages/ calls that he receives from [place].  We then spoke about residential treatment and detox. I provided him with the phone numbers for [facility], [place]: [phone], [facility]: [phone] and [place], [place]: [phone]. He said he is not quite ready to go.  He needs to discuss this plan with his family and needs to get his clothes together.  He also stated that he needs to arrange transportation.  He said he understands he needs to do this for his health and well being but he needs to speak to his family.    I strongly encouraged him to call [facility] in [facility] because he needs to make sure that they have bed available  and they will also give him information about detox.   He said he understood. I also told him that he will need to bring his medications with him.  He said he understood that too.  And again, I strongly urged him to call [facility] to check bed availability so he can better prepare since he is not ready to go this minute  He was very appreciative of the information and I told him to call me back with any questions.

## 2023-08-31 NOTE — Progress Notes (Signed)
 Subjective:   [name] is a 53 y.o. male presents for hospital follow up and establish care. Admit date to the hospital was 08/28/23, patient was discharged from the hospital on 08/29/23, patient was admitted for: mental health issues. Started 7 years ago drinking heavily and tried suicide took 200 pill and alcohol . He was in a relationship with a male after several years of dating made him feel less of a man unable to provide and give, spend $$ like her husband was doing. Decided life wasn't worth living. Straight forward your problems is alcohol  abuse what do you want to do about it. Needs inpatient treatment . Patient agreed but needed to let family know and get clothes. Spoke with clinical nurse management to coordinate bed. Not as easy as expect. Left with number to [facility] and ask if availability . Reach out Monday to f/u on if still willing. Past Medical History:  Diagnosis Date   Alcoholism /alcohol  abuse    Anemia    Anxiety    Arthritis    knees; arms; elbows (03/26/2015)   Asthma    Bipolar disorder (HCC)    Chronic bronchitis (HCC)    Chronic lower back pain    Chronic pancreatitis (HCC)    Cocaine abuse (HCC)    Depression    Family history of adverse reaction to anesthesia    Femoral condyle fracture (HCC) 03/08/2014   left medial/notes 03/09/2014   GERD (gastroesophageal reflux disease)    H/O hiatal hernia    H/O suicide attempt 10/2012   High cholesterol    History of blood transfusion 10/2012   when I tried to commit suicide   History of stomach ulcers    Hypertension    Marijuana abuse, continuous    Migraine    a few times/year (03/26/2015)   Pancreatitis    Pneumonia 1990's X 3   PTSD (post-traumatic stress disorder)    Seizures (HCC)    Sickle cell trait (HCC)    WPW (Wolff-Parkinson-White syndrome)    [name] 03/06/2013     Allergies  Allergen Reactions   Robaxin  [Methocarbamol ] Other (See Comments)    jumpy limbs   Aspirin  Other  (See Comments)    Unknown reaction   Shellfish-Derived Products Nausea And Vomiting and Rash   Trazodone  And Nefazodone Other (See Comments)    Muscle spasms   Adhesive [Tape] Itching   Fish-Derived Products    Latex Itching   Toradol  [Ketorolac  Tromethamine ] Other (See Comments)    Has ulcers; cannot have this   Contrast Media [Iodinated Contrast Media] Hives   Reglan  [Metoclopramide ] Other (See Comments)    Muscle spasms   Salmon [Fish Oil] Nausea And Vomiting and Rash    Current Outpatient Medications on File Prior to Visit  Medication Sig Dispense Refill   acetaminophen  (TYLENOL ) 500 MG tablet Take 1,000 mg by mouth daily as needed for mild pain (pain score 1-3) or moderate pain (pain score 4-6).     bismuth  subsalicylate (KAOPECTATE) 262 MG/15ML suspension Take 15 mLs by mouth daily as needed for indigestion or diarrhea or loose stools.     dicyclomine  (BENTYL ) 20 MG tablet Take 1 tablet (20 mg total) by mouth daily as needed for spasms. (Patient not taking: Reported on 08/26/2023) 30 tablet 0   famotidine  (PEPCID ) 40 MG tablet Take 1 tablet (40 mg total) by mouth daily. 30 tablet 0   fluticasone  (FLONASE ) 50 MCG/ACT nasal spray Place 2 sprays into both nostrils daily as  needed for allergies.     folic acid  (FOLVITE ) 1 MG tablet Take 1 tablet (1 mg total) by mouth daily. 30 tablet 0   lidocaine  (XYLOCAINE ) 2 % solution Use as directed 15 mLs in the mouth or throat every 12 (twelve) hours as needed. 100 mL 0   lipase/protease/amylase (CREON ) 36000 UNITS CPEP capsule Take 2 capsules (72,000 Units total) by mouth 3 (three) times daily with meals. May also take 1 capsule (36,000 Units total) as needed (with snacks). 240 capsule 0   loperamide  (IMODIUM ) 2 MG capsule Take 1 capsule (2 mg total) by mouth as needed for diarrhea or loose stools. 20 capsule 0   magnesium  oxide (MAG-OX) 400 (240 Mg) MG tablet Take 1 tablet (400 mg total) by mouth daily. 30 tablet 0   metoprolol  (TOPROL -XL) 200  MG 24 hr tablet Take 1 tablet (200 mg total) by mouth daily. 60 tablet 0   Multiple Vitamin (MULTIVITAMIN WITH MINERALS) TABS tablet Take 1 tablet by mouth daily. 30 tablet 0   ondansetron  (ZOFRAN -ODT) 4 MG disintegrating tablet Take 1 tablet (4 mg total) by mouth daily as needed for nausea or vomiting. 20 tablet 0   oxyCODONE  (OXY IR/ROXICODONE ) 5 MG immediate release tablet Take 1 tablet (5 mg total) by mouth every 8 (eight) hours as needed for moderate pain (pain score 4-6). 4 tablet 0   psyllium (HYDROCIL/METAMUCIL) 95 % PACK Take 1 packet by mouth daily. 240 each 0   sodium zirconium cyclosilicate  (LOKELMA ) 10 g PACK packet Take 10 g by mouth 2 (two) times daily. (Patient not taking: Reported on 08/26/2023) 60 packet 1   tamsulosin  (FLOMAX ) 0.4 MG CAPS capsule Take 1 capsule (0.4 mg total) by mouth daily. 30 capsule 1   thiamine  (VITAMIN B1) 100 MG tablet Take 1 tablet (100 mg total) by mouth daily. 30 tablet 0   [DISCONTINUED] amitriptyline  (ELAVIL ) 25 MG tablet Take 1 tablet (25 mg total) by mouth at bedtime. (Patient not taking: Reported on 08/08/2019) 30 tablet 0   No current facility-administered medications on file prior to visit.    Review of System: ROS Comprehensive ROS Pertinent positive and negative noted in HPI   Objective:  BP 118/84 (BP Location: Right Arm, Patient Position: Sitting, Cuff Size: Normal)   Pulse 91   Resp 16   Wt 122 lb 3.2 oz (55.4 kg)   SpO2 95%   BMI 18.58 kg/m   Filed Weights   08/31/23 1126  Weight: 122 lb 3.2 oz (55.4 kg)    Physical Exam Vitals reviewed.  HENT:     Head: Normocephalic.     Right Ear: Tympanic membrane and external ear normal.     Left Ear: Tympanic membrane and external ear normal.     Nose: Nose normal.  Eyes:     Extraocular Movements: Extraocular movements intact.     Pupils: Pupils are equal, round, and reactive to light.  Cardiovascular:     Rate and Rhythm: Normal rate and regular rhythm.  Pulmonary:     Effort:  Pulmonary effort is normal.     Breath sounds: Normal breath sounds.  Abdominal:     General: Bowel sounds are normal. There is distension.     Palpations: Abdomen is soft.  Musculoskeletal:        General: Normal range of motion.  Skin:    General: Skin is warm and dry.  Neurological:     Mental Status: He is oriented to person, place, and time.  Psychiatric:  Mood and Affect: Mood normal.        Behavior: Behavior normal.        Thought Content: Thought content normal.        Judgment: Judgment normal.      Assessment:  Alcee was seen today for hospitalization follow-up.  Diagnoses and all orders for this visit:  Hospital discharge follow-up See HPI  Epigastric pain 2/2 Alcohol  abuse CARAFATE  1 g tablet TID  Alcohol  abuse See HPI    This note has been created with Education officer, environmental. Any transcriptional errors are unintentional.   No follow-ups on file.  Marius Siemens, NP 09/02/2023, 6:30 PM

## 2023-08-31 NOTE — Telephone Encounter (Signed)
 No action is needed.  Spoke with our Research officer, political party.  Contact center needs to make all patients aware that we are not taking new patients referred or transfer of care.  ED should also be aware that we are not accepting new patients.     Copied from CRM [id number]. Topic: Appointments - Scheduling Inquiry for Clinic >> Aug 31, 2023  1:29 PM Hamdi H wrote: Reason for CRM: This patient was referred to us  by the ER hospital to establish care for pain management. He has been waiting since December 2024 but we haven't been accepting new patients. He wanted me to send a note to let admin know if they could possibly take him on as a new patient. Best call back number for him is: [phone]

## 2023-09-01 ENCOUNTER — Encounter: Payer: MEDICAID | Admitting: Family Medicine

## 2023-09-02 MED ORDER — SUCRALFATE 1 G PO TABS: 1.0000 g | ORAL_TABLET | Freq: Three times a day (TID) | ORAL | 1 refills | Status: DC

## 2023-09-03 ENCOUNTER — Encounter (HOSPITAL_COMMUNITY): Payer: Self-pay

## 2023-09-03 ENCOUNTER — Other Ambulatory Visit: Payer: Self-pay

## 2023-09-03 ENCOUNTER — Emergency Department (HOSPITAL_COMMUNITY): Payer: MEDICAID

## 2023-09-03 ENCOUNTER — Emergency Department (HOSPITAL_COMMUNITY)
Admission: EM | Admit: 2023-09-03 | Discharge: 2023-09-03 | Disposition: A | Discharge status: Home / Self Care | Payer: MEDICAID | Attending: Emergency Medicine | Admitting: Emergency Medicine

## 2023-09-03 DIAGNOSIS — R1013 Epigastric pain: Secondary | ICD-10-CM | POA: Diagnosis present

## 2023-09-03 DIAGNOSIS — Z91040 Latex allergy status: Secondary | ICD-10-CM | POA: Insufficient documentation

## 2023-09-03 DIAGNOSIS — K861 Other chronic pancreatitis: Secondary | ICD-10-CM | POA: Diagnosis not present

## 2023-09-03 LAB — COMPREHENSIVE METABOLIC PANEL WITH GFR
ALT: 28 U/L (ref 0–44)
AST: 41 U/L (ref 15–41)
Albumin: 4.8 g/dL (ref 3.5–5.0)
Alkaline Phosphatase: 89 U/L (ref 38–126)
Anion gap: 13 (ref 5–15)
BUN: 11 mg/dL (ref 6–20)
CO2: 17 mmol/L — ABNORMAL LOW (ref 22–32)
Calcium: 10.1 mg/dL (ref 8.9–10.3)
Chloride: 105 mmol/L (ref 98–111)
Creatinine, Ser: 0.94 mg/dL (ref 0.61–1.24)
GFR, Estimated: >60 mL/min (ref >60)
Glucose, Bld: 98 mg/dL (ref 70–99)
Potassium: 4.2 mmol/L (ref 3.5–5.1)
Sodium: 135 mmol/L (ref 135–145)
Total Bilirubin: 0.7 mg/dL (ref 0.0–1.2)
Total Protein: 9.3 g/dL — ABNORMAL HIGH (ref 6.5–8.1)

## 2023-09-03 LAB — URINALYSIS, ROUTINE W REFLEX MICROSCOPIC
Bilirubin Urine: NEGATIVE
Glucose, UA: NEGATIVE mg/dL
Hgb urine dipstick: NEGATIVE
Ketones, ur: NEGATIVE mg/dL
Leukocytes,Ua: NEGATIVE
Nitrite: NEGATIVE
Protein, ur: NEGATIVE mg/dL
Specific Gravity, Urine: 1.024 (ref 1.005–1.030)
pH: 5 (ref 5.0–8.0)

## 2023-09-03 LAB — CBC WITH DIFFERENTIAL/PLATELET
Abs Immature Granulocytes: 0.04 10*3/uL (ref 0.00–0.07)
Basophils Absolute: 0 10*3/uL (ref 0.0–0.1)
Basophils Relative: 0 %
Eosinophils Absolute: 0.3 10*3/uL (ref 0.0–0.5)
Eosinophils Relative: 2 %
HCT: 46.1 % (ref 39.0–52.0)
Hemoglobin: 14.7 g/dL (ref 13.0–17.0)
Immature Granulocytes: 0 %
Lymphocytes Relative: 35 %
Lymphs Abs: 3.8 10*3/uL (ref 0.7–4.0)
MCH: 27.3 pg (ref 26.0–34.0)
MCHC: 31.9 g/dL (ref 30.0–36.0)
MCV: 85.7 fL (ref 80.0–100.0)
Monocytes Absolute: 1.1 10*3/uL — ABNORMAL HIGH (ref 0.1–1.0)
Monocytes Relative: 11 %
Neutro Abs: 5.4 10*3/uL (ref 1.7–7.7)
Neutrophils Relative %: 52 %
Platelets: 295 10*3/uL (ref 150–400)
RBC: 5.38 MIL/uL (ref 4.22–5.81)
RDW: 18 % — ABNORMAL HIGH (ref 11.5–15.5)
WBC: 10.7 10*3/uL — ABNORMAL HIGH (ref 4.0–10.5)
nRBC: 0 % (ref 0.0–0.2)

## 2023-09-03 LAB — RESP PANEL BY RT-PCR (RSV, FLU A&B, COVID)  RVPGX2
Influenza A by PCR: NEGATIVE
Influenza B by PCR: NEGATIVE
Resp Syncytial Virus by PCR: NEGATIVE
SARS Coronavirus 2 by RT PCR: NEGATIVE

## 2023-09-03 LAB — TROPONIN I (HIGH SENSITIVITY)
Troponin I (High Sensitivity): 6 ng/L (ref <18)
Troponin I (High Sensitivity): 5 ng/L (ref <18)

## 2023-09-03 LAB — LIPASE, BLOOD: Lipase: 26 U/L (ref 11–51)

## 2023-09-03 MED ORDER — SODIUM CHLORIDE 0.9 % IV BOLUS
1000.0000 mL | Freq: Once | INTRAVENOUS | Status: AC
  Administered 2023-09-03: 1000 mL via INTRAVENOUS

## 2023-09-03 MED ORDER — MORPHINE SULFATE (PF) 4 MG/ML IV SOLN
4.0000 mg | Freq: Once | INTRAVENOUS | Status: AC
  Administered 2023-09-03: 4 mg via INTRAVENOUS
  Filled 2023-09-03: qty 1

## 2023-09-03 MED ORDER — LIDOCAINE VISCOUS HCL 2 % MT SOLN
15.0000 mL | Freq: Once | OROMUCOSAL | Status: AC
  Administered 2023-09-03: 15 mL via ORAL
  Filled 2023-09-03: qty 15

## 2023-09-03 MED ORDER — PANTOPRAZOLE SODIUM 20 MG PO TBEC: 20.0000 mg | DELAYED_RELEASE_TABLET | Freq: Every day | ORAL | 0 refills | Status: DC

## 2023-09-03 MED ORDER — ALUM & MAG HYDROXIDE-SIMETH 200-200-20 MG/5ML PO SUSP
30.0000 mL | Freq: Once | ORAL | Status: AC
  Administered 2023-09-03: 30 mL via ORAL
  Filled 2023-09-03: qty 30

## 2023-09-03 NOTE — ED Provider Notes (Signed)
 [facility] Provider Note   CSN: [id number] Arrival date & time: 09/03/23  1808     History  Chief Complaint  Patient presents with   Abdominal Pain    [name] is a 53 y.o. male.  With a history of chronic pancreatitis and GERD who presents to the ED for abdominal pain.  Multiple recent visits for abdominal pain with 3 in the last month.  Today's abdominal pain epigastric feels similar to prior episodes of pancreatitis.  Reports nausea vomiting diarrhea.  No fevers chills chest pain shortness of breath   Abdominal Pain      Home Medications Prior to Admission medications   Medication Sig Start Date End Date Taking? Authorizing Provider  acetaminophen  (TYLENOL ) 500 MG tablet Take 1,000 mg by mouth daily as needed for mild pain (pain score 1-3) or moderate pain (pain score 4-6).    [provider]  bismuth  subsalicylate (KAOPECTATE) 262 MG/15ML suspension Take 15 mLs by mouth daily as needed for indigestion or diarrhea or loose stools.    [provider]  dicyclomine  (BENTYL ) 20 MG tablet Take 1 tablet (20 mg total) by mouth daily as needed for spasms. Patient not taking: Reported on 08/26/2023 08/21/23   Jason Casey, MD  famotidine  (PEPCID ) 40 MG tablet Take 1 tablet (40 mg total) by mouth daily. 08/21/23   Ghimire, Estil Heman, MD  fluticasone  (FLONASE ) 50 MCG/ACT nasal spray Place 2 sprays into both nostrils daily as needed for allergies.    [provider]  folic acid  (FOLVITE ) 1 MG tablet Take 1 tablet (1 mg total) by mouth daily. 08/21/23   Ghimire, Estil Heman, MD  lidocaine  (XYLOCAINE ) 2 % solution Use as directed 15 mLs in the mouth or throat every 12 (twelve) hours as needed. 08/26/23   Deatra Face, MD  lipase/protease/amylase (CREON ) 36000 UNITS CPEP capsule Take 2 capsules (72,000 Units total) by mouth 3 (three) times daily with meals. May also take 1 capsule (36,000 Units total) as  needed (with snacks). 08/21/23   Ghimire, Estil Heman, MD  loperamide  (IMODIUM ) 2 MG capsule Take 1 capsule (2 mg total) by mouth as needed for diarrhea or loose stools. 03/07/23   Armenta Landau, MD  magnesium  oxide (MAG-OX) 400 (240 Mg) MG tablet Take 1 tablet (400 mg total) by mouth daily. 08/21/23   Ghimire, Estil Heman, MD  metoprolol  (TOPROL -XL) 200 MG 24 hr tablet Take 1 tablet (200 mg total) by mouth daily. 08/21/23   Ghimire, Estil Heman, MD  Multiple Vitamin (MULTIVITAMIN WITH MINERALS) TABS tablet Take 1 tablet by mouth daily. 08/21/23   Ghimire, Estil Heman, MD  ondansetron  (ZOFRAN -ODT) 4 MG disintegrating tablet Take 1 tablet (4 mg total) by mouth daily as needed for nausea or vomiting. 08/21/23   Ghimire, Estil Heman, MD  oxyCODONE  (OXY IR/ROXICODONE ) 5 MG immediate release tablet Take 1 tablet (5 mg total) by mouth every 8 (eight) hours as needed for moderate pain (pain score 4-6). 08/29/23   Felicie Horning, PA-C  psyllium (HYDROCIL/METAMUCIL) 95 % PACK Take 1 packet by mouth daily. 08/21/23   Ghimire, Estil Heman, MD  sodium zirconium cyclosilicate  (LOKELMA ) 10 g PACK packet Take 10 g by mouth 2 (two) times daily. Patient not taking: Reported on 08/26/2023 08/21/23   Jason Casey, MD  sucralfate  (CARAFATE ) 1 g tablet Take 1 tablet (1 g total) by mouth in the morning, at noon, and at bedtime. 09/02/23   [name]  P, NP  tamsulosin  (FLOMAX ) 0.4 MG CAPS capsule Take 1 capsule (0.4 mg total) by mouth daily. 08/21/23   Ghimire, Estil Heman, MD  thiamine  (VITAMIN B1) 100 MG tablet Take 1 tablet (100 mg total) by mouth daily. 08/21/23   Ghimire, Estil Heman, MD  amitriptyline  (ELAVIL ) 25 MG tablet Take 1 tablet (25 mg total) by mouth at bedtime. Patient not taking: Reported on 08/08/2019 10/15/18 08/08/19  Jason Peru, MD      Allergies    Robaxin  [methocarbamol ], Aspirin , Shellfish-derived products, Trazodone  and nefazodone, Adhesive [tape], Fish-derived products, Latex, Toradol  [ketorolac   tromethamine ], Contrast media [iodinated contrast media], Reglan  [metoclopramide ], and Clent Czar oil]    Review of Systems   Review of Systems  Gastrointestinal:  Positive for abdominal pain.    Physical Exam Updated Vital Signs BP (!) 135/99 (BP Location: Right Arm)   Pulse 98   Temp 98 F (36.7 C) (Oral)   Resp 16   Ht 5\' 8"  (1.727 m)   Wt 57.2 kg   SpO2 100%   BMI 19.16 kg/m  Physical Exam Vitals and nursing note reviewed.  HENT:     Head: Normocephalic and atraumatic.  Eyes:     Pupils: Pupils are equal, round, and reactive to light.  Cardiovascular:     Rate and Rhythm: Normal rate and regular rhythm.  Pulmonary:     Effort: Pulmonary effort is normal.     Breath sounds: Normal breath sounds.  Abdominal:     Palpations: Abdomen is soft.     Tenderness: There is abdominal tenderness in the epigastric area.  Skin:    General: Skin is warm and dry.  Neurological:     Mental Status: He is alert.  Psychiatric:        Mood and Affect: Mood normal.     ED Results / Procedures / Treatments   Labs (all labs ordered are listed, but only abnormal results are displayed) Labs Reviewed  COMPREHENSIVE METABOLIC PANEL WITH GFR - Abnormal; Notable for the following components:      Result Value   CO2 17 (*)    Total Protein 9.3 (*)    All other components within normal limits  CBC WITH DIFFERENTIAL/PLATELET - Abnormal; Notable for the following components:   WBC 10.7 (*)    RDW 18.0 (*)    Monocytes Absolute 1.1 (*)    All other components within normal limits  RESP PANEL BY RT-PCR (RSV, FLU A&B, COVID)  [id number]  LIPASE, BLOOD  URINALYSIS, ROUTINE W REFLEX MICROSCOPIC  TROPONIN I (HIGH SENSITIVITY)  TROPONIN I (HIGH SENSITIVITY)    EKG None  Radiology DG Chest 2 View Result Date: 09/03/2023 EXAM: 2 VIEW(S) XRAY OF THE CHEST 09/03/2023 07:30:00 PM COMPARISON: 08/26/2023 CLINICAL HISTORY: PNA?[name] Patient notes abdominal pain with diarrhea and vomiting today. Per  triage notes: Pt here for CP and abd pain that started since 4/21. C/O n/v/d. Hx of pancreatitis. C/O shob with pain. FINDINGS: LUNGS AND PLEURA: No consolidation. No pulmonary edema. No pleural effusion. No pneumothorax. HEART AND MEDIASTINUM: No acute abnormality of the cardiac and mediastinal silhouettes. BONES AND SOFT TISSUES: No acute osseous abnormality. IMPRESSION: 1. No acute process. Electronically signed by: Zadie Herter MD 09/03/2023 08:43 PM EDT RP Workstation: [id number]    Procedures Procedures    Medications Ordered in ED Medications  sodium chloride  0.9 % bolus 1,000 mL (0 mLs Intravenous Stopped 09/03/23 2136)  morphine  (PF) 4 MG/ML injection 4 mg (4 mg Intravenous Given 09/03/23 1950)  alum & mag hydroxide-simeth (MAALOX/MYLANTA) 200-200-20 MG/5ML suspension 30 mL (30 mLs Oral Given 09/03/23 2030)    And  lidocaine  (XYLOCAINE ) 2 % viscous mouth solution 15 mL (15 mLs Oral Given 09/03/23 2031)  morphine  (PF) 4 MG/ML injection 4 mg (4 mg Intravenous Given 09/03/23 2150)    ED Course/ Medical Decision Making/ A&P Clinical Course as of 09/03/23 2308  Sun Sep 03, 2023  2307 No significant elevation in lipase.  Suspect this is most likely chronic pancreatitis flare.  Patient reports improvement in his symptoms after fluids morphine  GI cocktail.  Will discharge instructed for PCP follow-up [MP]    Clinical Course User Index [MP] Sallyanne Creamer, DO                                 Medical Decision Making 53 year old male returns for recurrent abdominal pain.  Epigastric tenderness on exam.  Afebrile uncomfortable appearing.  Suspect this is most likely acute on chronic pancreatitis.  Will give IV fluids pain medications and keep n.p.o. for now.    differential diagnosis includes: Acute intra-abdominal infectious/inflammatory process such as appendicitis, diverticulitis, pancreatitis and cholecystitis Urinary tract infection Atypical presentation for pneumonia Viral  gastroenteritis  Will obtain laboratory workup including CBC with differential, metabolic panel, lipase and urinalysis  Amount and/or Complexity of Data Reviewed Labs: ordered. Radiology: ordered.  Risk OTC drugs. Prescription drug management.           Final Clinical Impression(s) / ED Diagnoses Final diagnoses:  Chronic pancreatitis, unspecified pancreatitis type [facility])    Rx / DC Orders ED Discharge Orders     None         Sallyanne Creamer, DO 09/03/23 2308

## 2023-09-03 NOTE — Discharge Instructions (Signed)
 You were seen in the emerged department for acute abdominal pain Your blood work looked okay Your symptoms improved after pain medicines and a GI cocktail and IV fluids Return to the emergency department for new or worsening symptoms

## 2023-09-03 NOTE — ED Triage Notes (Signed)
 Pt here for CP and abd pain that started since 4/21. C/O n/v/d.Hx of pancreatitis. C/O shob with pain.

## 2023-09-03 NOTE — ED Notes (Signed)
 Assumed care of patient at this time

## 2023-09-05 NOTE — Telephone Encounter (Signed)
 I called to check on patient and had to leave him a message requesting a call back.

## 2023-09-11 ENCOUNTER — Emergency Department (HOSPITAL_COMMUNITY): Payer: MEDICAID

## 2023-09-11 ENCOUNTER — Other Ambulatory Visit: Payer: Self-pay

## 2023-09-11 ENCOUNTER — Encounter (HOSPITAL_COMMUNITY): Payer: Self-pay | Admitting: Emergency Medicine

## 2023-09-11 ENCOUNTER — Emergency Department (HOSPITAL_COMMUNITY)
Admission: EM | Admit: 2023-09-11 | Discharge: 2023-09-11 | Disposition: A | Discharge status: Home / Self Care | Payer: MEDICAID | Attending: Emergency Medicine | Admitting: Emergency Medicine

## 2023-09-11 DIAGNOSIS — R1013 Epigastric pain: Secondary | ICD-10-CM | POA: Diagnosis present

## 2023-09-11 DIAGNOSIS — Z91040 Latex allergy status: Secondary | ICD-10-CM | POA: Diagnosis not present

## 2023-09-11 DIAGNOSIS — K838 Other specified diseases of biliary tract: Secondary | ICD-10-CM | POA: Diagnosis not present

## 2023-09-11 DIAGNOSIS — Z79899 Other long term (current) drug therapy: Secondary | ICD-10-CM | POA: Diagnosis not present

## 2023-09-11 LAB — TROPONIN I (HIGH SENSITIVITY)
Troponin I (High Sensitivity): 3 ng/L (ref <18)
Troponin I (High Sensitivity): 4 ng/L (ref <18)

## 2023-09-11 LAB — CBC
HCT: 36.8 % — ABNORMAL LOW (ref 39.0–52.0)
Hemoglobin: 11.1 g/dL — ABNORMAL LOW (ref 13.0–17.0)
MCH: 27.3 pg (ref 26.0–34.0)
MCHC: 30.2 g/dL (ref 30.0–36.0)
MCV: 90.4 fL (ref 80.0–100.0)
Platelets: 149 10*3/uL — ABNORMAL LOW (ref 150–400)
RBC: 4.07 MIL/uL — ABNORMAL LOW (ref 4.22–5.81)
RDW: 18.8 % — ABNORMAL HIGH (ref 11.5–15.5)
WBC: 7.4 10*3/uL (ref 4.0–10.5)
nRBC: 0 % (ref 0.0–0.2)

## 2023-09-11 LAB — HEPATIC FUNCTION PANEL
ALT: 32 U/L (ref 0–44)
AST: 56 U/L — ABNORMAL HIGH (ref 15–41)
Albumin: 3.6 g/dL (ref 3.5–5.0)
Alkaline Phosphatase: 69 U/L (ref 38–126)
Bilirubin, Direct: 0.1 mg/dL (ref 0.0–0.2)
Indirect Bilirubin: 0.6 mg/dL (ref 0.3–0.9)
Total Bilirubin: 0.7 mg/dL (ref 0.0–1.2)
Total Protein: 6.8 g/dL (ref 6.5–8.1)

## 2023-09-11 LAB — BASIC METABOLIC PANEL WITH GFR
Anion gap: 11 (ref 5–15)
BUN: 5 mg/dL — ABNORMAL LOW (ref 6–20)
CO2: 14 mmol/L — ABNORMAL LOW (ref 22–32)
Calcium: 7.4 mg/dL — ABNORMAL LOW (ref 8.9–10.3)
Chloride: 116 mmol/L — ABNORMAL HIGH (ref 98–111)
Creatinine, Ser: 0.96 mg/dL (ref 0.61–1.24)
GFR, Estimated: >60 mL/min (ref >60)
Glucose, Bld: 150 mg/dL — ABNORMAL HIGH (ref 70–99)
Potassium: 4.7 mmol/L (ref 3.5–5.1)
Sodium: 141 mmol/L (ref 135–145)

## 2023-09-11 LAB — LIPASE, BLOOD: Lipase: 23 U/L (ref 11–51)

## 2023-09-11 MED ORDER — ONDANSETRON 4 MG PO TBDP
4.0000 mg | ORAL_TABLET | Freq: Once | ORAL | Status: AC
  Administered 2023-09-11: 4 mg via ORAL
  Filled 2023-09-11: qty 1

## 2023-09-11 MED ORDER — MORPHINE SULFATE (PF) 4 MG/ML IV SOLN
4.0000 mg | Freq: Once | INTRAVENOUS | Status: AC
  Administered 2023-09-11: 4 mg via INTRAVENOUS
  Filled 2023-09-11: qty 1

## 2023-09-11 MED ORDER — OXYCODONE HCL 5 MG PO TABS: 5.0000 mg | ORAL_TABLET | ORAL | 0 refills | Status: DC | PRN

## 2023-09-11 MED ORDER — SODIUM CHLORIDE 0.9 % IV BOLUS
1000.0000 mL | Freq: Once | INTRAVENOUS | Status: AC
  Administered 2023-09-11: 1000 mL via INTRAVENOUS

## 2023-09-11 MED ORDER — LIDOCAINE VISCOUS HCL 2 % MT SOLN
15.0000 mL | Freq: Once | OROMUCOSAL | Status: AC
  Administered 2023-09-11: 15 mL via ORAL
  Filled 2023-09-11: qty 15

## 2023-09-11 MED ORDER — ALUM & MAG HYDROXIDE-SIMETH 200-200-20 MG/5ML PO SUSP
30.0000 mL | Freq: Once | ORAL | Status: AC
  Administered 2023-09-11: 30 mL via ORAL
  Filled 2023-09-11: qty 30

## 2023-09-11 MED ORDER — MORPHINE SULFATE (PF) 2 MG/ML IV SOLN
1.0000 mg | Freq: Once | INTRAVENOUS | Status: AC
  Administered 2023-09-11: 1 mg via INTRAVENOUS
  Filled 2023-09-11: qty 1

## 2023-09-11 NOTE — ED Notes (Signed)
 Please give an update to mother number under emergency contacts

## 2023-09-11 NOTE — ED Provider Triage Note (Signed)
 Emergency Medicine Provider Triage Evaluation Note  [name] , a 53 y.o. male  was evaluated in triage.  Pt complains of epigastric pain with clay stool.  Patient thinks that his pancreatitis is acting up but is also concerned about an obstruction.  Patient states last bowel movement was earlier today.  Patient denies any chest pain with me but states he has had nausea and vomiting.  Patient denies any alcohol  use.  Patient denies any fevers or shortness of breath.  Review of Systems  Positive:  Negative:   Physical Exam  BP (!) 169/109 (BP Location: Left Arm)   Pulse 68   Temp 98.2 F (36.8 C) (Oral)   Resp 18   SpO2 98%  Gen:   Awake, no distress   Resp:  Normal effort  MSK:   Moves extremities without difficulty  Other:  Epigastric tenderness without peritoneal signs  Medical Decision Making  Medically screening exam initiated at 1:09 PM.  Appropriate orders placed.  [name] was informed that the remainder of the evaluation will be completed by another provider, this initial triage assessment does not replace that evaluation, and the importance of remaining in the ED until their evaluation is complete.  Workup initiated, will give Zofran  and get abdominal x-rays patient is concerned about obstruction although he states he is passing clay stool, patient stable for lobby.   Denese Finn, PA-C 09/11/23 1310

## 2023-09-11 NOTE — ED Provider Notes (Signed)
 [place] [facility] AT [facility] Provider Note   CSN: [id number] Arrival date & time: 09/11/23  1216     History Chief Complaint  Patient presents with   Chest Pain    [name] is a 53 y.o. male.  Patient with past history significant for chronic pancreatitis presents the emergency department today with concerns of epigastric and right upper quadrant abdominal pain.  Reports that his symptoms began last night with multiple episodes of diarrhea and 3 episodes of vomiting this morning.  Denies recent fever, chills or bodyaches.  States that he denies any recent substance or alcohol  use.  Does report that he previously had some clay colored stools but stool is now a brown color.  Tried taking home medications without any improvement in symptoms.   Chest Pain      Home Medications Prior to Admission medications   Medication Sig Start Date End Date Taking? Authorizing Provider  oxyCODONE  (ROXICODONE ) 5 MG immediate release tablet Take 1 tablet (5 mg total) by mouth every 4 (four) hours as needed for severe pain (pain score 7-10). 09/11/23  Yes Brenda Cowher A, PA-C  acetaminophen  (TYLENOL ) 500 MG tablet Take 1,000 mg by mouth daily as needed for mild pain (pain score 1-3) or moderate pain (pain score 4-6).    [provider]  bismuth  subsalicylate (KAOPECTATE) 262 MG/15ML suspension Take 15 mLs by mouth daily as needed for indigestion or diarrhea or loose stools.    [provider]  dicyclomine  (BENTYL ) 20 MG tablet Take 1 tablet (20 mg total) by mouth daily as needed for spasms. Patient not taking: Reported on 08/26/2023 08/21/23   Burton Casey, MD  famotidine  (PEPCID ) 40 MG tablet Take 1 tablet (40 mg total) by mouth daily. 08/21/23   Ghimire, Estil Heman, MD  fluticasone  (FLONASE ) 50 MCG/ACT nasal spray Place 2 sprays into both nostrils daily as needed for allergies.    [provider]  folic acid  (FOLVITE ) 1 MG tablet Take  1 tablet (1 mg total) by mouth daily. 08/21/23   Ghimire, Estil Heman, MD  lidocaine  (XYLOCAINE ) 2 % solution Use as directed 15 mLs in the mouth or throat every 12 (twelve) hours as needed. 08/26/23   Deatra Face, MD  lipase/protease/amylase (CREON ) 36000 UNITS CPEP capsule Take 2 capsules (72,000 Units total) by mouth 3 (three) times daily with meals. May also take 1 capsule (36,000 Units total) as needed (with snacks). 08/21/23   Ghimire, Estil Heman, MD  loperamide  (IMODIUM ) 2 MG capsule Take 1 capsule (2 mg total) by mouth as needed for diarrhea or loose stools. 03/07/23   Armenta Landau, MD  magnesium  oxide (MAG-OX) 400 (240 Mg) MG tablet Take 1 tablet (400 mg total) by mouth daily. 08/21/23   Ghimire, Estil Heman, MD  metoprolol  (TOPROL -XL) 200 MG 24 hr tablet Take 1 tablet (200 mg total) by mouth daily. 08/21/23   Ghimire, Estil Heman, MD  Multiple Vitamin (MULTIVITAMIN WITH MINERALS) TABS tablet Take 1 tablet by mouth daily. 08/21/23   Ghimire, Estil Heman, MD  ondansetron  (ZOFRAN -ODT) 4 MG disintegrating tablet Take 1 tablet (4 mg total) by mouth daily as needed for nausea or vomiting. 08/21/23   Ghimire, Estil Heman, MD  oxyCODONE  (OXY IR/ROXICODONE ) 5 MG immediate release tablet Take 1 tablet (5 mg total) by mouth every 8 (eight) hours as needed for moderate pain (pain score 4-6). 08/29/23   Felicie Horning, PA-C  pantoprazole  (PROTONIX ) 20 MG tablet Take 1 tablet (  20 mg total) by mouth daily. 09/03/23   Rafael Bun A, DO  psyllium (HYDROCIL/METAMUCIL) 95 % PACK Take 1 packet by mouth daily. 08/21/23   Ghimire, Estil Heman, MD  sodium zirconium cyclosilicate  (LOKELMA ) 10 g PACK packet Take 10 g by mouth 2 (two) times daily. Patient not taking: Reported on 08/26/2023 08/21/23   Burton Casey, MD  sucralfate  (CARAFATE ) 1 g tablet Take 1 tablet (1 g total) by mouth in the morning, at noon, and at bedtime. 09/02/23   Marius Siemens, NP  tamsulosin  (FLOMAX ) 0.4 MG CAPS capsule Take 1 capsule (0.4  mg total) by mouth daily. 08/21/23   Ghimire, Estil Heman, MD  thiamine  (VITAMIN B1) 100 MG tablet Take 1 tablet (100 mg total) by mouth daily. 08/21/23   Ghimire, Estil Heman, MD  amitriptyline  (ELAVIL ) 25 MG tablet Take 1 tablet (25 mg total) by mouth at bedtime. Patient not taking: Reported on 08/08/2019 10/15/18 08/08/19  Kraig Peru, MD      Allergies    Robaxin  [methocarbamol ], Aspirin , Shellfish-derived products, Trazodone  and nefazodone, Adhesive [tape], Fish-derived products, Latex, Toradol  [ketorolac  tromethamine ], Contrast media [iodinated contrast media], Reglan  [metoclopramide ], and Clent Czar oil]    Review of Systems   Review of Systems  Cardiovascular:  Positive for chest pain.  All other systems reviewed and are negative.   Physical Exam Updated Vital Signs BP (!) 181/101 (BP Location: Right Arm)   Pulse 75   Temp 97.9 F (36.6 C) (Oral)   Resp 20   SpO2 99%  Physical Exam Vitals and nursing note reviewed.  Constitutional:      General: He is not in acute distress.    Appearance: He is well-developed.  HENT:     Head: Normocephalic and atraumatic.  Eyes:     Conjunctiva/sclera: Conjunctivae normal.  Cardiovascular:     Rate and Rhythm: Normal rate and regular rhythm.     Heart sounds: No murmur heard. Pulmonary:     Effort: Pulmonary effort is normal. No respiratory distress.     Breath sounds: Normal breath sounds.  Abdominal:     Palpations: Abdomen is soft.     Tenderness: There is abdominal tenderness. There is guarding.     Comments: TTP along the RUQ, epigastrium, and LUQ.  Guarding present in these areas.  No other area of focal pain.  No obvious abdominal distention.  Musculoskeletal:        General: No swelling.     Cervical back: Neck supple.  Skin:    General: Skin is warm and dry.     Capillary Refill: Capillary refill takes less than 2 seconds.  Neurological:     Mental Status: He is alert.  Psychiatric:        Mood and Affect: Mood normal.      ED Results / Procedures / Treatments   Labs (all labs ordered are listed, but only abnormal results are displayed) Labs Reviewed  BASIC METABOLIC PANEL WITH GFR - Abnormal; Notable for the following components:      Result Value   Chloride 116 (*)    CO2 14 (*)    Glucose, Bld 150 (*)    BUN 5 (*)    Calcium  7.4 (*)    All other components within normal limits  CBC - Abnormal; Notable for the following components:   RBC 4.07 (*)    Hemoglobin 11.1 (*)    HCT 36.8 (*)    RDW 18.8 (*)    Platelets  149 (*)    All other components within normal limits  HEPATIC FUNCTION PANEL - Abnormal; Notable for the following components:   AST 56 (*)    All other components within normal limits  LIPASE, BLOOD  TROPONIN I (HIGH SENSITIVITY)  TROPONIN I (HIGH SENSITIVITY)    EKG EKG Interpretation Date/Time:  Monday Sep 11 2023 12:31:07 EDT Ventricular Rate:  82 PR Interval:  106 QRS Duration:  78 QT Interval:  308 QTC Calculation: 359 R Axis:   69  Text Interpretation: Sinus rhythm with short PR T wave abnormality, consider inferolateral ischemia Abnormal ECG When compared with ECG of 03-Sep-2023 20:09, PREVIOUS ECG IS PRESENT No significant change Confirmed by Jerald Molly [phone]) on 09/11/2023 2:37:51 PM  Radiology US  Abdomen Limited RUQ (LIVER/GB) Result Date: 09/11/2023 CLINICAL DATA:  151471 RUQ pain 151471 EXAM: ULTRASOUND ABDOMEN LIMITED RIGHT UPPER QUADRANT COMPARISON:  None Available. FINDINGS: Gallbladder: Distended fluid-filled gallbladder. No gallstones. No wall thickening or pericholecystic fluid. No sonographic Murphy's sign noted by sonographer. Common bile duct: Diameter: 10 mm Liver: Normal echogenicity. No focal lesion identified. No intrahepatic biliary ductal dilation. Portal vein is patent on color Doppler imaging with normal direction of blood flow towards the liver. Other: None. IMPRESSION: 1. No cholecystolithiasis or changes of acute cholecystitis. 2. Abnormal  dilation of the common bile duct. Correlation with serum bilirubin recommended. If elevated, a nonemergent abdominal MRI with IV contrast should be considered for further characterization. Electronically Signed   By: Rance Burrows M.D.   On: 09/11/2023 18:56   DG Abd Portable 1 View Result Date: 09/11/2023 CLINICAL DATA:  r/o obstruction. EXAM: PORTABLE ABDOMEN - 1 VIEW COMPARISON:  CT scan abdomen and pelvis from 08/13/2023. FINDINGS: The bowel gas pattern is non-obstructive. There is small-to-moderate stool burden, including the ascending colon, compatible with colonic hypomotility. No evidence of pneumoperitoneum, within the limitations of a supine film. No acute osseous abnormalities. The soft tissues are within normal limits. Surgical changes, devices, tubes and lines: None. IMPRESSION: *Nonobstructive bowel gas pattern. Electronically Signed   By: Beula Brunswick M.D.   On: 09/11/2023 13:35   DG Chest 2 View Result Date: 09/11/2023 CLINICAL DATA:  Chest pain. EXAM: CHEST - 2 VIEW COMPARISON:  09/03/2023. FINDINGS: Stable cardiomediastinal silhouette. No focal consolidation, pleural effusion, or pneumothorax. No acute osseous abnormality. IMPRESSION: No acute cardiopulmonary findings. Electronically Signed   By: Mannie Seek M.D.   On: 09/11/2023 13:13    Procedures Procedures    Medications Ordered in ED Medications  ondansetron  (ZOFRAN -ODT) disintegrating tablet 4 mg (4 mg Oral Given 09/11/23 1313)  sodium chloride  0.9 % bolus 1,000 mL (0 mLs Intravenous Stopped 09/11/23 1921)  morphine  (PF) 4 MG/ML injection 4 mg (4 mg Intravenous Given 09/11/23 1545)  morphine  (PF) 4 MG/ML injection 4 mg (4 mg Intravenous Given 09/11/23 1806)  alum & mag hydroxide-simeth (MAALOX/MYLANTA) 200-200-20 MG/5ML suspension 30 mL (30 mLs Oral Given 09/11/23 2027)    And  lidocaine  (XYLOCAINE ) 2 % viscous mouth solution 15 mL (15 mLs Oral Given 09/11/23 2027)  morphine  (PF) 2 MG/ML injection 1 mg (1 mg Intravenous Given  09/11/23 2052)    ED Course/ Medical Decision Making/ A&P                                 Medical Decision Making Amount and/or Complexity of Data Reviewed Labs: ordered. Radiology: ordered.  Risk OTC drugs. Prescription drug management.  This patient presents to the ED for concern of chest pain, abdominal pain.  Differential diagnosis includes acute on chronic pancreatitis, bowel obstruction, cholecystitis, CBD obstruction   Lab Tests:  I Ordered, and personally interpreted labs.  The pertinent results include: CBC unremarkable, BMP unremarkable, troponin normal at 3, lipase unremarkable at 23 but possibly elevation due to chronic pancreatitis, hepatic function slightly some AST but otherwise unremarkable with normal bilirubin   Imaging Studies ordered:  I ordered imaging studies including x-ray of the abdomen, chest x-ray, ultrasound of the right upper quadrant I independently visualized and interpreted imaging which showed abdominal x-ray shows nonobstructive bowel gas pattern, chest x-ray unremarkable, right upper quadrant ultrasound shows some common bile duct dilation at 10 mm but no obvious signs of obstruction. I agree with the radiologist interpretation   Medicines ordered and prescription drug management:  I ordered medication including Zofran , morphine , fluids, Maalox, viscous lidocaine  for nausea, pain, pancreatitis type pain Reevaluation of the patient after these medicines showed that the patient improved I have reviewed the patients home medicines and have made adjustments as needed   Problem List / ED Course:  Patient past history significant for chronic pancreatitis presents emergency department today with concerns of abdominal pain.  Reports pain present in the epigastrium/lower chest.  No feelings of shortness of breath or exertional dyspnea.  He endorses some nausea and vomiting since yesterday.  Denies any substance use recently. On exam, patient has pain  to the palpation towards the right upper quadrant epigastrium.  No obvious chest wall tenderness.  Normal heart and lung sounds.  Will proceed with labs and imaging of the right upper quadrant.  Imaging of the chest and abdomen obtained from triage.  Do not feel any benefit of CT imaging at this time. Patient lab workup is thankfully reassuring.  Lipase is not elevated although this is likely due to patient's chronic pancreatitis alcohol  minimally reactive to physiologic changes.  Liver enzymes and bilirubin largely unremarkable.  Ultrasound imaging pending at this time.  Patient being treated with symptomatic medications including morphine  and Zofran .  Fluid resuscitation also initiated. On reevaluation, patient does appear to have some improvement in symptoms.  He is resting comfortably.  Lab workup unremarkable at this time. Ultrasound imaging shows no acute findings in the right upper quadrant to explain pain.  There is some dilation of the common bile duct measuring about 10 mm.  This may explain patient's recent clay colored stools that have now returned back to normal.  There may have been some level obstruction recently that has now resolved.  Advised patient that he would benefit from outpatient gastroenterology follow-up for further assessment of his current symptoms.  Patient agreed with this plan and discharged home with plans for outpatient follow-up.   Social Determinants of Health:  History of numerous hospitalizations for acute on chronic pancreatitis  Final Clinical Impression(s) / ED Diagnoses Final diagnoses:  Epigastric abdominal pain  Common bile duct dilation    Rx / DC Orders ED Discharge Orders          Ordered    oxyCODONE  (ROXICODONE ) 5 MG immediate release tablet  Every 4 hours PRN        09/11/23 2035              Amarri Michaelson A, PA-C 09/11/23 2321    Tegeler, Marine Sia, MD 09/12/23 [phone]

## 2023-09-11 NOTE — Discharge Instructions (Addendum)
 You were seen in the emergency department today for concerns of chest pain/abdominal pain.  Your labs were thankfully reassuring and your imaging was also very reassuring.  You did have some signs of dilation of your bile duct which at times can be due to obstruction of the bile duct but no obvious obstruction was seen and your bilirubin is normal.  I would recommend following up with gastroenterology for further evaluation of your abdominal pain from your likely pancreatitis as well as the dilation in your bile duct.  For any concerns of new or worsening symptoms, please return to the emergency department.

## 2023-09-11 NOTE — ED Triage Notes (Signed)
 Pt reports intermittent CP since this morning. Endorses N/V since yeserday.  EMS VS: BP 192/96 HR 90 100% RA

## 2023-09-11 NOTE — ED Notes (Signed)
 Pt requesting more pain medications. PA notified.

## 2023-09-13 IMAGING — DX DG CHEST 2V
2 series · 2 of 2 positions shown · non-contrast
Comparison: 11/18/2020

CLINICAL DATA: Chest pain

EXAM:
CHEST - 2 VIEW

[chest pa]
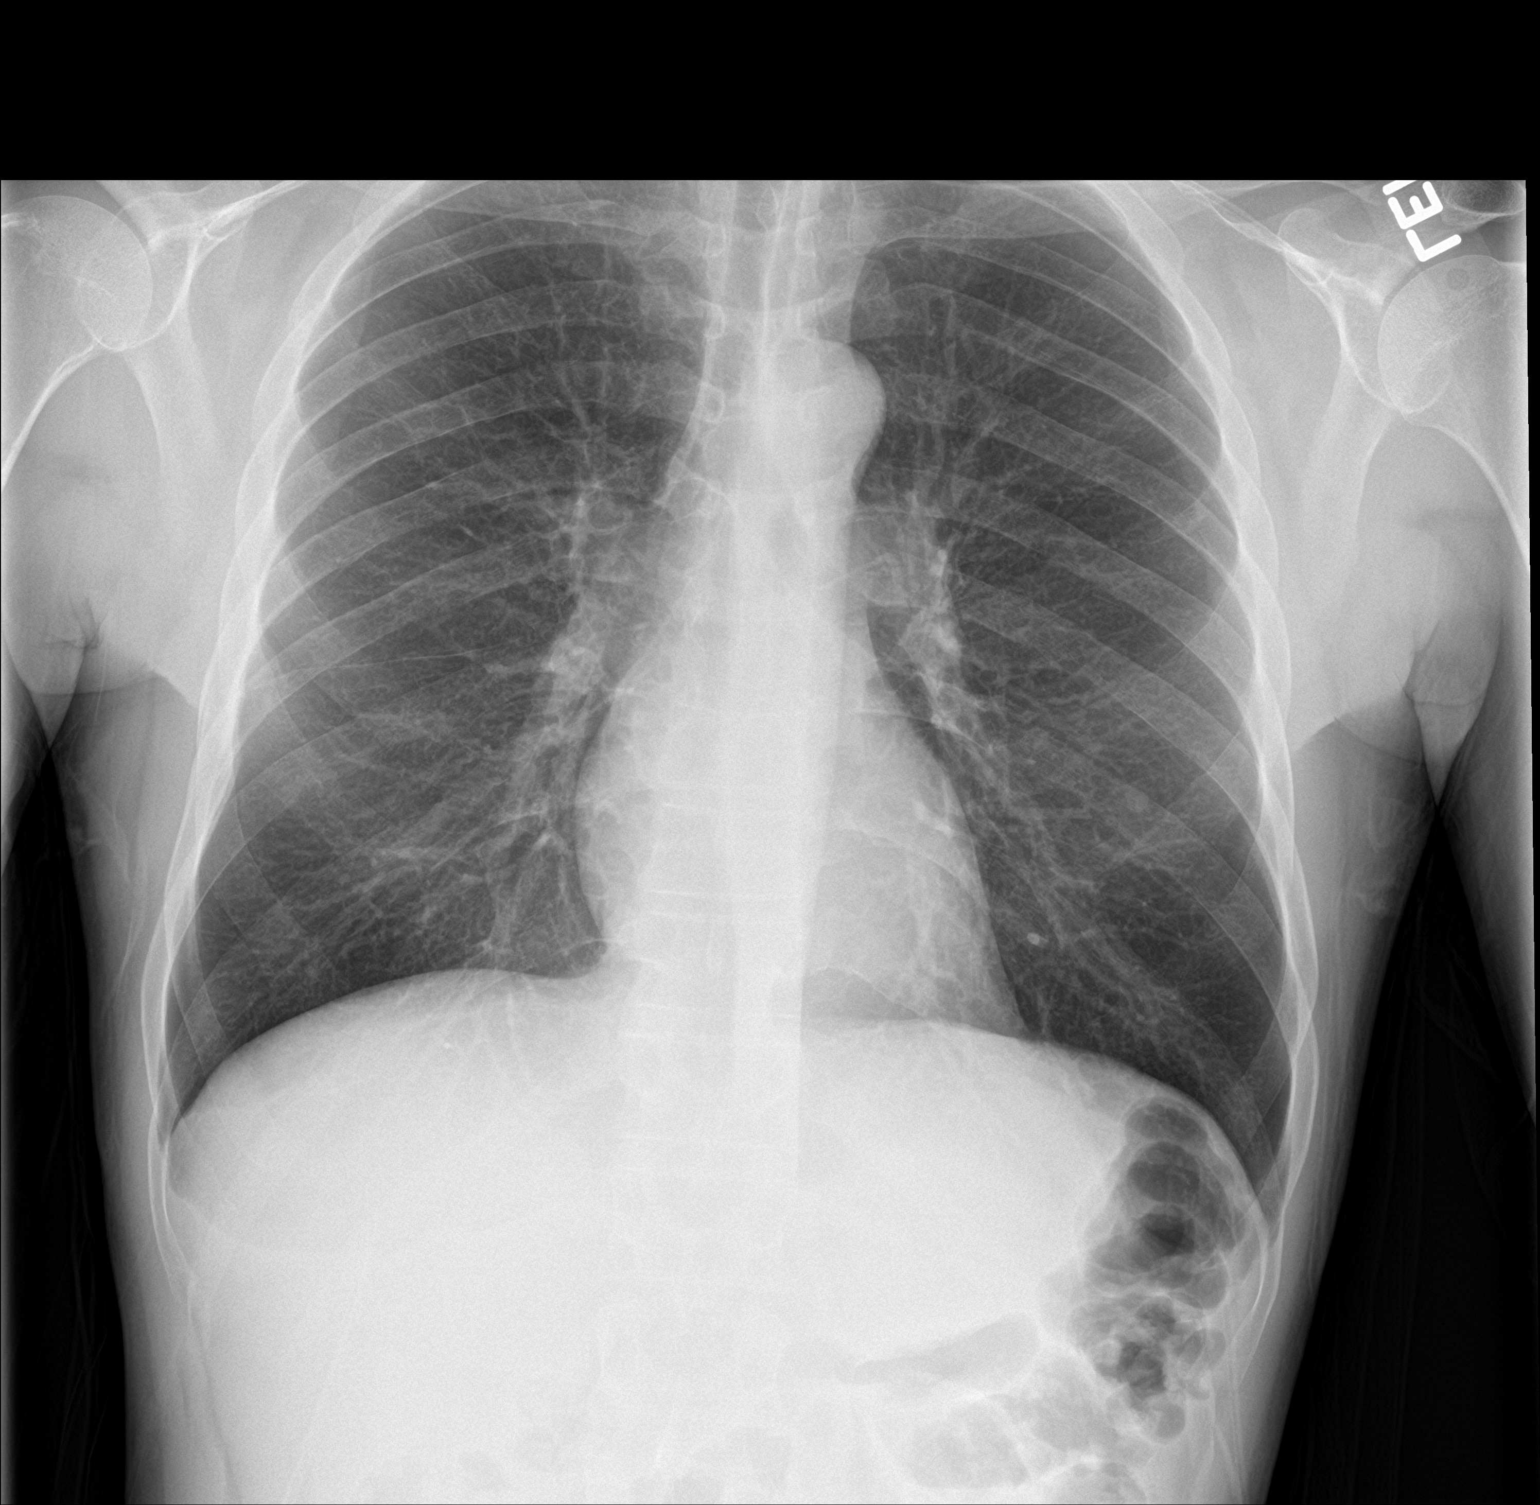

[chest lat]
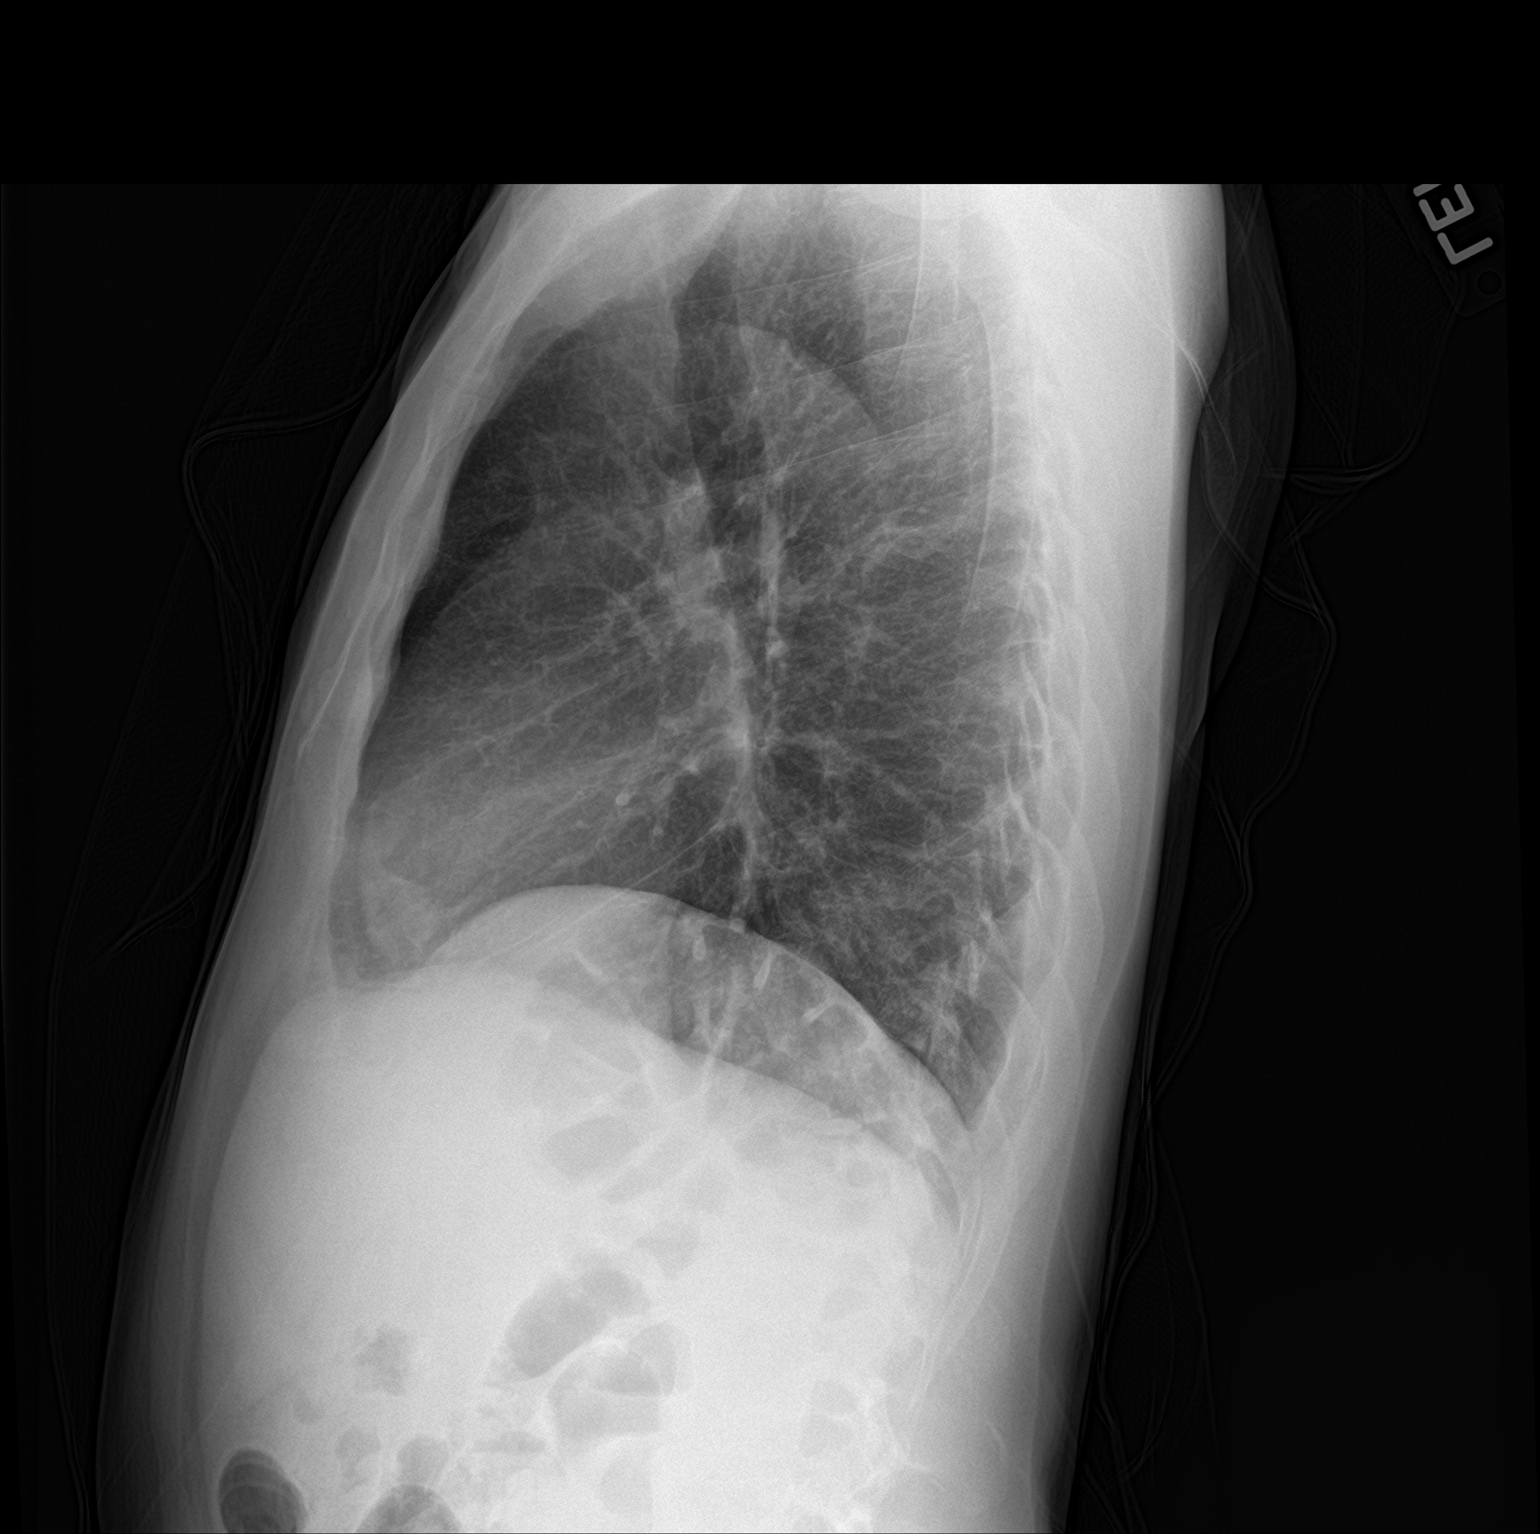

[2 of 2 positions shown; findings below may reference images not displayed]

FINDINGS: The heart size and mediastinal contours are within normal limits.
Both lungs are clear. The visualized skeletal structures are
unremarkable.
IMPRESSION: No active cardiopulmonary disease.

## 2023-09-13 NOTE — Telephone Encounter (Signed)
 I called the patient to see how he is doing and to inquire if he has followed up with [facility] or [facility].  He said he had to go to the hospital this week but is out and he plans to call [facility] at the first of next week. He said he will have to arrange transportation there.  I encouraged him to call [facility] to discuss his needs and he said he has their phone number that I gave him and he will call. We discussed the importance of getting help and he said he understood.    I asked him if he heard from [facility] and he said no, I gave him the number for [facility]: [phone]. I explained to him that it is very important that he call them because they may be able to assist him with getting the treatment he needs as well as securing transportation to a facility.  He was very appreciative and said he will call.  I told him to please call me back with any questions.

## 2023-09-18 ENCOUNTER — Emergency Department (HOSPITAL_COMMUNITY)
Admission: EM | Admit: 2023-09-18 | Discharge: 2023-09-18 | Disposition: A | Discharge status: Home / Self Care | Payer: MEDICAID | Attending: Emergency Medicine | Admitting: Emergency Medicine

## 2023-09-18 DIAGNOSIS — K860 Alcohol-induced chronic pancreatitis: Secondary | ICD-10-CM | POA: Diagnosis not present

## 2023-09-18 DIAGNOSIS — Z91040 Latex allergy status: Secondary | ICD-10-CM | POA: Insufficient documentation

## 2023-09-18 DIAGNOSIS — R1013 Epigastric pain: Secondary | ICD-10-CM | POA: Diagnosis present

## 2023-09-18 LAB — COMPREHENSIVE METABOLIC PANEL WITH GFR
ALT: 28 U/L (ref 0–44)
AST: 59 U/L — ABNORMAL HIGH (ref 15–41)
Albumin: 3.5 g/dL (ref 3.5–5.0)
Alkaline Phosphatase: 68 U/L (ref 38–126)
Anion gap: 13 (ref 5–15)
BUN: 7 mg/dL (ref 6–20)
CO2: 18 mmol/L — ABNORMAL LOW (ref 22–32)
Calcium: 5.9 mg/dL — CL (ref 8.9–10.3)
Chloride: 109 mmol/L (ref 98–111)
Creatinine, Ser: 0.83 mg/dL (ref 0.61–1.24)
GFR, Estimated: >60 mL/min (ref >60)
Glucose, Bld: 97 mg/dL (ref 70–99)
Potassium: 3.5 mmol/L (ref 3.5–5.1)
Sodium: 140 mmol/L (ref 135–145)
Total Bilirubin: 0.6 mg/dL (ref 0.0–1.2)
Total Protein: 6.7 g/dL (ref 6.5–8.1)

## 2023-09-18 LAB — URINALYSIS, ROUTINE W REFLEX MICROSCOPIC
Bilirubin Urine: NEGATIVE
Glucose, UA: NEGATIVE mg/dL
Hgb urine dipstick: NEGATIVE
Ketones, ur: NEGATIVE mg/dL
Leukocytes,Ua: NEGATIVE
Nitrite: NEGATIVE
Protein, ur: NEGATIVE mg/dL
Specific Gravity, Urine: 1.009 (ref 1.005–1.030)
pH: 5 (ref 5.0–8.0)

## 2023-09-18 LAB — CBC
HCT: 36.8 % — ABNORMAL LOW (ref 39.0–52.0)
Hemoglobin: 11.3 g/dL — ABNORMAL LOW (ref 13.0–17.0)
MCH: 28.1 pg (ref 26.0–34.0)
MCHC: 30.7 g/dL (ref 30.0–36.0)
MCV: 91.5 fL (ref 80.0–100.0)
Platelets: 129 10*3/uL — ABNORMAL LOW (ref 150–400)
RBC: 4.02 MIL/uL — ABNORMAL LOW (ref 4.22–5.81)
RDW: 20.8 % — ABNORMAL HIGH (ref 11.5–15.5)
WBC: 7.9 10*3/uL (ref 4.0–10.5)
nRBC: 0 % (ref 0.0–0.2)

## 2023-09-18 LAB — LIPASE, BLOOD: Lipase: 22 U/L (ref 11–51)

## 2023-09-18 MED ORDER — CALCIUM GLUCONATE-NACL 1-0.675 GM/50ML-% IV SOLN
1.0000 g | Freq: Once | INTRAVENOUS | Status: AC
  Administered 2023-09-18: 1000 mg via INTRAVENOUS
  Filled 2023-09-18: qty 50

## 2023-09-18 MED ORDER — OXYCODONE HCL 5 MG PO TABS
5.0000 mg | ORAL_TABLET | ORAL | Status: AC
  Administered 2023-09-18: 5 mg via ORAL
  Filled 2023-09-18: qty 1

## 2023-09-18 MED ORDER — THIAMINE MONONITRATE 100 MG PO TABS
100.0000 mg | ORAL_TABLET | Freq: Every day | ORAL | Status: DC
  Administered 2023-09-18: 100 mg via ORAL
  Filled 2023-09-18: qty 1

## 2023-09-18 MED ORDER — ONDANSETRON 4 MG PO TBDP
4.0000 mg | ORAL_TABLET | Freq: Once | ORAL | Status: AC
  Administered 2023-09-18: 4 mg via ORAL
  Filled 2023-09-18: qty 1

## 2023-09-18 MED ORDER — THIAMINE HCL 100 MG/ML IJ SOLN: 100.0000 mg | Freq: Every day | INTRAMUSCULAR | Status: DC

## 2023-09-18 MED ORDER — OYSTER SHELL CALCIUM/D3 500-5 MG-MCG PO TABS: 1.0000 | ORAL_TABLET | Freq: Every day | ORAL | 2 refills | Status: DC

## 2023-09-18 MED ORDER — ADULT MULTIVITAMIN W/MINERALS CH
1.0000 | ORAL_TABLET | Freq: Every day | ORAL | Status: DC
  Administered 2023-09-18: 1 via ORAL
  Filled 2023-09-18: qty 1

## 2023-09-18 MED ORDER — PANTOPRAZOLE SODIUM 20 MG PO TBEC: 20.0000 mg | DELAYED_RELEASE_TABLET | Freq: Every day | ORAL | 0 refills | Status: DC

## 2023-09-18 MED ORDER — OXYCODONE HCL 5 MG PO TABS: 5.0000 mg | ORAL_TABLET | ORAL | 0 refills | Status: DC | PRN

## 2023-09-18 MED ORDER — FOLIC ACID 1 MG PO TABS
1.0000 mg | ORAL_TABLET | Freq: Every day | ORAL | Status: DC
  Administered 2023-09-18: 1 mg via ORAL
  Filled 2023-09-18: qty 1

## 2023-09-18 MED ORDER — HYDROMORPHONE HCL 1 MG/ML IJ SOLN
0.5000 mg | Freq: Once | INTRAMUSCULAR | Status: AC
  Administered 2023-09-18: 0.5 mg via INTRAVENOUS
  Filled 2023-09-18: qty 1

## 2023-09-18 MED ORDER — LORAZEPAM 2 MG/ML IJ SOLN
1.0000 mg | Freq: Once | INTRAMUSCULAR | Status: AC
Start: 2023-09-18 — End: 2023-09-18
  Administered 2023-09-18: 1 mg via INTRAVENOUS
  Filled 2023-09-18: qty 1

## 2023-09-18 MED ORDER — ONDANSETRON HCL 4 MG/2ML IJ SOLN
4.0000 mg | Freq: Once | INTRAMUSCULAR | Status: AC
  Administered 2023-09-18: 4 mg via INTRAVENOUS
  Filled 2023-09-18: qty 2

## 2023-09-18 MED ORDER — ONDANSETRON HCL 4 MG PO TABS: 4.0000 mg | ORAL_TABLET | Freq: Four times a day (QID) | ORAL | 0 refills | Status: DC

## 2023-09-18 MED ORDER — OXYCODONE-ACETAMINOPHEN 5-325 MG PO TABS
2.0000 | ORAL_TABLET | Freq: Once | ORAL | Status: AC
  Administered 2023-09-18: 2 via ORAL
  Filled 2023-09-18: qty 2

## 2023-09-18 MED ORDER — LORAZEPAM 1 MG PO TABS
1.0000 mg | ORAL_TABLET | ORAL | Status: DC | PRN
  Administered 2023-09-18: 1 mg via ORAL
  Filled 2023-09-18: qty 1

## 2023-09-18 MED ORDER — SODIUM CHLORIDE 0.9 % IV BOLUS
1000.0000 mL | Freq: Once | INTRAVENOUS | Status: AC
  Administered 2023-09-18: 1000 mL via INTRAVENOUS

## 2023-09-18 NOTE — ED Triage Notes (Signed)
 BIB EMS from home with abd pain that started at 7am today. Vomited 1 times, multiple episodes of diarrhea. Same symptoms last week and was dx with a bile duct obstruction.   VS  184/106 104  18 rr 97% room air 121 cbg

## 2023-09-18 NOTE — Discharge Instructions (Signed)
 You were seen for your pancreatitis in the emergency department.   At home, please take the calcium  supplementation because your calcium  is low.  Take Zofran  for nausea and vomiting.  Take Tylenol  and the oxycodone  before your abdominal pain.    Check your MyChart online for the results of any tests that had not resulted by the time you left the emergency department.   Follow-up with your primary doctor in 2-3 days regarding your visit.    Return immediately to the emergency department if you experience any of the following: Worsening pain, seizures, muscle spasms, or any other concerning symptoms.    Thank you for visiting our Emergency Department. It was a pleasure taking care of you today.

## 2023-09-18 NOTE — ED Provider Triage Note (Signed)
 Emergency Medicine Provider Triage Evaluation Note  [name] , a 53 y.o. male  was evaluated in triage.  Pt complains of gastric pain.  The patient is a patient of [facility] and has frequent ER presentations for acute on chronic pancreatitis.  The patient states that his last alcoholic beverage was on Saturday night.  Last night and into the early morning this morning, the patient developed severe epigastric abdominal pain radiating to the back.  He states that his stools have changed from clay covered to more oily.  His last bowel movement yesterday and he is passing gas.  He had been trying to take his home opiates but has been vomiting this morning and unable to keep anything up.  Continues to endorse epigastric pain and nausea.  Review of Systems  Positive: Epigastric abdominal pain, nausea and vomiting Negative: Chest pain fever chills  Physical Exam  BP (!) 164/107 (BP Location: Right Arm)   Pulse (!) 102   Temp 97.9 F (36.6 C)   Resp 20   SpO2 100%  Gen:   Awake, in mild discomfort Resp:  Normal effort  CV:  Well perfused MSK:   Moves extremities without difficulty  Abd:  Epigastric abdominal pain, mild guarding  Medical Decision Making  Medically screening exam initiated at 1:48 PM.  Appropriate orders placed.  Fitzhugh Hendrick Schielke was informed that the remainder of the evaluation will be completed by another provider, this initial triage assessment does not replace that evaluation, and the importance of remaining in the ED until their evaluation is complete.  Given the patient's frequent presentations for the same, will defer initial triage imaging, obtain screening labs and obtain pain control and nausea control, patient stable for the lobby.   Rosealee Concha, MD 09/18/23 1355

## 2023-09-18 NOTE — ED Provider Notes (Signed)
 [facility] Provider Note   CSN: [id number] Arrival date & time: 09/18/23  1316     History {Add pertinent medical, surgical, social history, OB history to HPI:1} Chief Complaint  Patient presents with   Abdominal Pain    Hulet Dio Bethell is a 53 y.o. male.  53 year old male with history of chronic pancreatitis and alcohol  abuse who presents emergency department with abdominal pain, nausea, vomiting, and diarrhea.  Patient reports that at 7 AM today started having multiple episodes of loose stools.  Says that they are oily and greasy appearing.  Also has had several episodes of nausea and vomiting.  Also having epigastric abdominal pain that radiates to his back.  Was seen on 5/5 and had an ultrasound that showed dilatation of the common bile duct but had normal LFTs.  Has had 5 CTs since the beginning of the year which typically will show chronic pancreatitis with a pseudocyst and sometimes gastritis.  Had antibiotics a month ago for dental infection.  No history of C. difficile.  Drinks a sixpack of beer a day and last had alcohol  2 days ago.       Home Medications Prior to Admission medications   Medication Sig Start Date End Date Taking? Authorizing Provider  acetaminophen  (TYLENOL ) 500 MG tablet Take 1,000 mg by mouth daily as needed for mild pain (pain score 1-3) or moderate pain (pain score 4-6).    [provider]  bismuth  subsalicylate (KAOPECTATE) 262 MG/15ML suspension Take 15 mLs by mouth daily as needed for indigestion or diarrhea or loose stools.    [provider]  dicyclomine  (BENTYL ) 20 MG tablet Take 1 tablet (20 mg total) by mouth daily as needed for spasms. Patient not taking: Reported on 08/26/2023 08/21/23   Burton Casey, MD  famotidine  (PEPCID ) 40 MG tablet Take 1 tablet (40 mg total) by mouth daily. 08/21/23   Ghimire, Estil Heman, MD  fluticasone  (FLONASE ) 50 MCG/ACT nasal spray Place 2  sprays into both nostrils daily as needed for allergies.    [provider]  folic acid  (FOLVITE ) 1 MG tablet Take 1 tablet (1 mg total) by mouth daily. 08/21/23   Ghimire, Estil Heman, MD  lidocaine  (XYLOCAINE ) 2 % solution Use as directed 15 mLs in the mouth or throat every 12 (twelve) hours as needed. 08/26/23   Deatra Face, MD  lipase/protease/amylase (CREON ) 36000 UNITS CPEP capsule Take 2 capsules (72,000 Units total) by mouth 3 (three) times daily with meals. May also take 1 capsule (36,000 Units total) as needed (with snacks). 08/21/23   Ghimire, Estil Heman, MD  loperamide  (IMODIUM ) 2 MG capsule Take 1 capsule (2 mg total) by mouth as needed for diarrhea or loose stools. 03/07/23   Armenta Landau, MD  magnesium  oxide (MAG-OX) 400 (240 Mg) MG tablet Take 1 tablet (400 mg total) by mouth daily. 08/21/23   Ghimire, Estil Heman, MD  metoprolol  (TOPROL -XL) 200 MG 24 hr tablet Take 1 tablet (200 mg total) by mouth daily. 08/21/23   Ghimire, Estil Heman, MD  Multiple Vitamin (MULTIVITAMIN WITH MINERALS) TABS tablet Take 1 tablet by mouth daily. 08/21/23   Ghimire, Estil Heman, MD  ondansetron  (ZOFRAN -ODT) 4 MG disintegrating tablet Take 1 tablet (4 mg total) by mouth daily as needed for nausea or vomiting. 08/21/23   Ghimire, Estil Heman, MD  oxyCODONE  (OXY IR/ROXICODONE ) 5 MG immediate release tablet Take 1 tablet (5 mg total) by mouth every 8 (eight) hours as  needed for moderate pain (pain score 4-6). 08/29/23   Felicie Horning, PA-C  oxyCODONE  (ROXICODONE ) 5 MG immediate release tablet Take 1 tablet (5 mg total) by mouth every 4 (four) hours as needed for severe pain (pain score 7-10). 09/11/23   Zelaya, Oscar A, PA-C  pantoprazole  (PROTONIX ) 20 MG tablet Take 1 tablet (20 mg total) by mouth daily. 09/03/23   Rafael Bun A, DO  psyllium (HYDROCIL/METAMUCIL) 95 % PACK Take 1 packet by mouth daily. 08/21/23   Ghimire, Estil Heman, MD  sodium zirconium cyclosilicate  (LOKELMA ) 10 g PACK packet Take 10 g  by mouth 2 (two) times daily. Patient not taking: Reported on 08/26/2023 08/21/23   Burton Casey, MD  sucralfate  (CARAFATE ) 1 g tablet Take 1 tablet (1 g total) by mouth in the morning, at noon, and at bedtime. 09/02/23   Marius Siemens, NP  tamsulosin  (FLOMAX ) 0.4 MG CAPS capsule Take 1 capsule (0.4 mg total) by mouth daily. 08/21/23   Ghimire, Estil Heman, MD  thiamine  (VITAMIN B1) 100 MG tablet Take 1 tablet (100 mg total) by mouth daily. 08/21/23   Ghimire, Estil Heman, MD  amitriptyline  (ELAVIL ) 25 MG tablet Take 1 tablet (25 mg total) by mouth at bedtime. Patient not taking: Reported on 08/08/2019 10/15/18 08/08/19  Kraig Peru, MD      Allergies    Robaxin  [methocarbamol ], Aspirin , Shellfish-derived products, Trazodone  and nefazodone, Adhesive [tape], Fish-derived products, Latex, Toradol  [ketorolac  tromethamine ], Contrast media [iodinated contrast media], Reglan  [metoclopramide ], and Clent Czar oil]    Review of Systems   Review of Systems  Physical Exam Updated Vital Signs BP (!) 164/107 (BP Location: Right Arm)   Pulse (!) 102   Temp 97.9 F (36.6 C)   Resp 20   SpO2 100%  Physical Exam Vitals and nursing note reviewed.  Constitutional:      General: He is not in acute distress.    Appearance: He is well-developed.  HENT:     Head: Normocephalic and atraumatic.     Right Ear: External ear normal.     Left Ear: External ear normal.     Nose: Nose normal.     Mouth/Throat:     Comments: Mild tongue fasciculations Eyes:     Extraocular Movements: Extraocular movements intact.     Conjunctiva/sclera: Conjunctivae normal.     Pupils: Pupils are equal, round, and reactive to light.  Cardiovascular:     Rate and Rhythm: Normal rate and regular rhythm.     Heart sounds: Normal heart sounds.  Pulmonary:     Effort: Pulmonary effort is normal. No respiratory distress.     Breath sounds: Normal breath sounds.  Abdominal:     General: There is no distension.      Palpations: Abdomen is soft. There is no mass.     Tenderness: There is abdominal tenderness (Epigastrium). There is no guarding.  Musculoskeletal:     Cervical back: Normal range of motion and neck supple.     Right lower leg: No edema.     Left lower leg: No edema.  Skin:    General: Skin is warm and dry.  Neurological:     Mental Status: He is alert. Mental status is at baseline.  Psychiatric:        Mood and Affect: Mood normal.        Behavior: Behavior normal.     ED Results / Procedures / Treatments   Labs (all labs ordered are listed, but  only abnormal results are displayed) Labs Reviewed  CBC - Abnormal; Notable for the following components:      Result Value   RBC 4.02 (*)    Hemoglobin 11.3 (*)    HCT 36.8 (*)    RDW 20.8 (*)    Platelets 129 (*)    All other components within normal limits  URINALYSIS, ROUTINE W REFLEX MICROSCOPIC - Abnormal; Notable for the following components:   Color, Urine STRAW (*)    All other components within normal limits  COMPREHENSIVE METABOLIC PANEL WITH GFR - Abnormal; Notable for the following components:   CO2 18 (*)    Calcium  5.9 (*)    AST 59 (*)    All other components within normal limits  LIPASE, BLOOD    EKG None  Radiology No results found.  Procedures Procedures  {Document cardiac monitor, telemetry assessment procedure when appropriate:1}  Medications Ordered in ED Medications  calcium  gluconate 1 g/ 50 mL sodium chloride  IVPB (has no administration in time range)  sodium chloride  0.9 % bolus 1,000 mL (has no administration in time range)  ondansetron  (ZOFRAN ) injection 4 mg (has no administration in time range)  LORazepam  (ATIVAN ) injection 1 mg (has no administration in time range)  oxyCODONE -acetaminophen  (PERCOCET/ROXICET) 5-325 MG per tablet 2 tablet (2 tablets Oral Given 09/18/23 1359)  ondansetron  (ZOFRAN -ODT) disintegrating tablet 4 mg (4 mg Oral Given 09/18/23 1402)    ED Course/ Medical Decision  Making/ A&P   {   Click here for ABCD2, HEART and other calculatorsREFRESH Note before signing :1}                              Medical Decision Making Amount and/or Complexity of Data Reviewed Labs: ordered.  Risk Prescription drug management.   ***  {Document critical care time when appropriate:1} {Document review of labs and clinical decision tools ie heart score, Chads2Vasc2 etc:1}  {Document your independent review of radiology images, and any outside records:1} {Document your discussion with family members, caretakers, and with consultants:1} {Document social determinants of health affecting pt's care:1} {Document your decision making why or why not admission, treatments were needed:1} Final Clinical Impression(s) / ED Diagnoses Final diagnoses:  None    Rx / DC Orders ED Discharge Orders     None

## 2023-09-19 MED ORDER — FAMOTIDINE 40 MG PO TABS: 40.0000 mg | ORAL_TABLET | Freq: Every day | ORAL | 2 refills | Status: DC

## 2023-09-20 ENCOUNTER — Ambulatory Visit: Payer: MEDICAID | Attending: Primary Care

## 2023-09-20 ENCOUNTER — Other Ambulatory Visit: Payer: Self-pay

## 2023-09-20 ENCOUNTER — Ambulatory Visit: Payer: MEDICAID | Admitting: Physical Therapy

## 2023-09-20 DIAGNOSIS — M79661 Pain in right lower leg: Secondary | ICD-10-CM | POA: Diagnosis present

## 2023-09-20 DIAGNOSIS — M6281 Muscle weakness (generalized): Secondary | ICD-10-CM | POA: Insufficient documentation

## 2023-09-20 DIAGNOSIS — R2689 Other abnormalities of gait and mobility: Secondary | ICD-10-CM | POA: Insufficient documentation

## 2023-09-20 NOTE — Therapy (Unsigned)
 OUTPATIENT PHYSICAL THERAPY LOWER EXTREMITY EVALUATION   Patient Name: [name] MRN: [id number] DOB:1971/01/18, 53 y.o., male Today's Date: 09/21/2023  END OF SESSION:  PT End of Session - 09/20/23 1455     Visit Number 1    Number of Visits 9    Date for PT Re-Evaluation 11/16/23    Authorization Type [employer]    PT Start Time 1401    PT Stop Time 1437    PT Time Calculation (min) 36 min    Activity Tolerance Patient tolerated treatment well    Behavior During Therapy WFL for tasks assessed/performed             Past Medical History:  Diagnosis Date   Alcoholism /alcohol  abuse    Anemia    Anxiety    Arthritis    knees; arms; elbows (03/26/2015)   Asthma    Bipolar disorder (HCC)    Chronic bronchitis (HCC)    Chronic lower back pain    Chronic pancreatitis (HCC)    Cocaine abuse (HCC)    Depression    Family history of adverse reaction to anesthesia    Femoral condyle fracture (HCC) 03/08/2014   left medial/notes 03/09/2014   GERD (gastroesophageal reflux disease)    H/O hiatal hernia    H/O suicide attempt 10/2012   High cholesterol    History of blood transfusion 10/2012   when I tried to commit suicide   History of stomach ulcers    Hypertension    Marijuana abuse, continuous    Migraine    a few times/year (03/26/2015)   Pancreatitis    Pneumonia 1990's X 3   PTSD (post-traumatic stress disorder)    Seizures (HCC)    Sickle cell trait (HCC)    WPW (Wolff-Parkinson-White syndrome)    Maximo Spar 03/06/2013   Past Surgical History:  Procedure Laterality Date   BIOPSY  11/25/2017   Procedure: BIOPSY;  Surgeon: Evangeline Hilts, MD;  Location: [facility] ENDOSCOPY;  Service: Endoscopy;;   BIOPSY  10/14/2018   Procedure: BIOPSY;  Surgeon: Evangeline Hilts, MD;  Location: [facility] ENDOSCOPY;  Service: Endoscopy;;   BIOPSY  03/06/2023   Procedure: BIOPSY;  Surgeon: Normie Becton., MD;  Location: [facility] ENDOSCOPY;  Service: Gastroenterology;;   CARDIAC  CATHETERIZATION     CYST ENTEROSTOMY  01/02/2020   Procedure: CYST ASPIRATION;  Surgeon: Janel Medford, MD;  Location: [facility] ENDOSCOPY;  Service: Endoscopy;;   ESOPHAGOGASTRODUODENOSCOPY N/A 03/06/2023   Procedure: ESOPHAGOGASTRODUODENOSCOPY (EGD);  Surgeon: Normie Becton., MD;  Location: [facility] ENDOSCOPY;  Service: Gastroenterology;  Laterality: N/A;   ESOPHAGOGASTRODUODENOSCOPY (EGD) WITH PROPOFOL  N/A 11/25/2017   Procedure: ESOPHAGOGASTRODUODENOSCOPY (EGD) WITH PROPOFOL ;  Surgeon: Evangeline Hilts, MD;  Location: [facility] ENDOSCOPY;  Service: Endoscopy;  Laterality: N/A;   ESOPHAGOGASTRODUODENOSCOPY (EGD) WITH PROPOFOL  Left 10/14/2018   Procedure: ESOPHAGOGASTRODUODENOSCOPY (EGD) WITH PROPOFOL ;  Surgeon: Evangeline Hilts, MD;  Location: [facility] ENDOSCOPY;  Service: Endoscopy;  Laterality: Left;   ESOPHAGOGASTRODUODENOSCOPY (EGD) WITH PROPOFOL  N/A 11/14/2018   Procedure: ESOPHAGOGASTRODUODENOSCOPY (EGD) WITH PROPOFOL ;  Surgeon: Jolinda Necessary, MD;  Location: [facility] ENDOSCOPY;  Service: Gastroenterology;  Laterality: N/A;   ESOPHAGOGASTRODUODENOSCOPY (EGD) WITH PROPOFOL  N/A 01/02/2020   Procedure: ESOPHAGOGASTRODUODENOSCOPY (EGD) WITH PROPOFOL ;  Surgeon: Janel Medford, MD;  Location: [facility] ENDOSCOPY;  Service: Endoscopy;  Laterality: N/A;   ESOPHAGOGASTRODUODENOSCOPY (EGD) WITH PROPOFOL  N/A 10/25/2020   Procedure: ESOPHAGOGASTRODUODENOSCOPY (EGD) WITH PROPOFOL ;  Surgeon: Brice Campi Albino Alu., MD;  Location: [facility] ENDOSCOPY;  Service: Gastroenterology;  Laterality: N/A;   EUS N/A 01/02/2020  Procedure: UPPER ENDOSCOPIC ULTRASOUND (EUS) RADIAL;  Surgeon: Janel Medford, MD;  Location: [facility] ENDOSCOPY;  Service: Endoscopy;  Laterality: N/A;   EUS N/A 03/06/2023   Procedure: UPPER ENDOSCOPIC ULTRASOUND (EUS) RADIAL;  Surgeon: Normie Becton., MD;  Location: [facility] ENDOSCOPY;  Service: Gastroenterology;  Laterality: N/A;   EYE SURGERY Left 1990's   "result of trauma"    FACIAL FRACTURE SURGERY Left 1990's   "result  of trauma"    FINE NEEDLE ASPIRATION N/A 03/06/2023   Procedure: FINE NEEDLE ASPIRATION (FNA) LINEAR;  Surgeon: Normie Becton., MD;  Location: [facility] ENDOSCOPY;  Service: Gastroenterology;  Laterality: N/A;   FLEXIBLE SIGMOIDOSCOPY N/A 10/25/2020   Procedure: FLEXIBLE SIGMOIDOSCOPY;  Surgeon: Brice Campi Albino Alu., MD;  Location: [facility] ENDOSCOPY;  Service: Gastroenterology;  Laterality: N/A;   FRACTURE SURGERY     HEMOSTASIS CLIP PLACEMENT  10/25/2020   Procedure: HEMOSTASIS CLIP PLACEMENT;  Surgeon: Normie Becton., MD;  Location: [facility] ENDOSCOPY;  Service: Gastroenterology;;   HERNIA REPAIR     HOT HEMOSTASIS N/A 03/06/2023   Procedure: HOT HEMOSTASIS (ARGON PLASMA COAGULATION/BICAP);  Surgeon: Normie Becton., MD;  Location: [facility] ENDOSCOPY;  Service: Gastroenterology;  Laterality: N/A;   LEFT HEART CATHETERIZATION WITH CORONARY ANGIOGRAM Right 03/07/2013   Procedure: LEFT HEART CATHETERIZATION WITH CORONARY ANGIOGRAM;  Surgeon: Darrold Emms, MD;  Location: [facility] CATH LAB;  Service: Cardiovascular;  Laterality: Right;   UMBILICAL HERNIA REPAIR     UPPER GASTROINTESTINAL ENDOSCOPY     Patient Active Problem List   Diagnosis Date Noted   Intractable diarrhea 08/10/2023   Intractable nausea and vomiting 06/18/2023   History of peptic ulcer 03/07/2023   Lumbosacral radiculopathy 03/07/2023   Duodenitis 03/06/2023   Gastric AVM 03/06/2023   AVM (arteriovenous malformation) of small bowel, 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   Hyperkalemia 03/03/2023   Acute urinary retention 03/01/2023   Intractable pain 02/09/2023   Metabolic acidosis, increased anion gap 02/08/2023   Cocaine abuse (HCC) 02/08/2023   Adynamic ileus (HCC) 01/25/2023   Cannabinoid hyperemesis syndrome 01/25/2023   Epigastric abdominal pain 01/09/2023   Asthma, chronic 01/09/2023   Ileus (HCC) 01/09/2023   Hypokalemia 09/18/2022   NSVT (nonsustained ventricular tachycardia) (HCC) 01/20/2022   Normal anion gap metabolic  acidosis 12/08/2021   Hypoalbuminemia 12/08/2021   Acute recurrent pancreatitis 12/07/2021   WPW (Wolff-Parkinson-White syndrome) 12/06/2021   Paroxysmal atrial fibrillation (HCC) 12/06/2021   Pulmonary nodule 12/06/2021   Leukocytosis 06/07/2021   Epigastric pain 06/07/2021   Acute alcoholic pancreatitis 06/06/2021   Urinary retention    Protein-calorie malnutrition, severe 11/17/2020   Acute pancreatitis 11/15/2020   Hypomagnesemia    Hypocalcemia 10/23/2020   AKI (acute kidney injury) (HCC) 11/13/2018   Seizure (HCC) 11/13/2018   Chest pain 01/08/2018   Acute on chronic pancreatitis (HCC) 09/28/2017   Abdominal pain 05/27/2017   Tachycardia 03/18/2017   Nausea, vomiting, and diarrhea 03/18/2017   Diarrhea 03/18/2017   GERD (gastroesophageal reflux disease)    Alcohol  use disorder 12/05/2016   Normocytic anemia 12/05/2016   Alcohol  use disorder, severe, dependence (HCC) 07/25/2016   Cocaine use disorder, severe, dependence (HCC) 07/25/2016   Major depressive disorder, 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   Chronic pancreatitis (HCC) 05/18/2015   Polysubstance abuse (tobacco, cocaine, THC, and ETOH) 03/26/2015   Essential hypertension 02/06/2014   Mood disorder (HCC) 02/06/2014   Pancreatic pseudocyst/cyst 11/25/2013   Zoanne Hinders White pattern seen on electrocardiogram 10/03/2012   Alcohol  abuse 03/23/2007   Tobacco abuse 03/23/2007  PCP: Marius Siemens, NP  REFERRING PROVIDER: Marius Siemens, NP  REFERRING DIAG:  [id number] (ICD-10-CM) - Pancreatitis R53.81 (ICD-10-CM) - Physical deconditioning  THERAPY DIAG:  Muscle weakness (generalized)  Other abnormalities of gait and mobility  Pain in right lower leg  Rationale for Evaluation and Treatment: Rehabilitation  ONSET DATE: Chronic  SUBJECTIVE:   SUBJECTIVE STATEMENT: Pt presents to PT with reports of increased deconditioning related to walking and R anterior LE pain  that was exacerbated by broken toe two months ago. He can only walking about 5-10 minutes now when he used to walk roughly 4 miles or 1 hour at a time. Pain has been limiting him but he also feels his LE are much weaker over the last few months/years. Burning and tingling sensation in R LE, PT recommended neurology consult especially with hx of seizure activity since he does not have one on care team. Also recommended DPM visit for R toes and that pt reach out to PCP regarding this.   PERTINENT HISTORY: WPW, HTN, Drug/ETOH abuse, Depression, Bipolar Disorder  PAIN:  Are you having pain?  Yes: NPRS scale: 6/10 Worst: 10/10 Pain location: R anterior LE Pain description: burning, sharp Aggravating factors: walking, standing Relieving factors: none  PRECAUTIONS: None  RED FLAGS: None   WEIGHT BEARING RESTRICTIONS: No  FALLS:  Has patient fallen in last 6 months? No  LIVING ENVIRONMENT: Lives with: lives with their family Lives in: House/apartment Stairs: 9 stairs in and out of house - handrails each side Has following equipment at home: None  OCCUPATION: Not currently working  PLOF: Independent  PATIENT GOALS: pt would like to improve his comfort with walking and get back to being able to do this for 4 miles   NEXT MD VISIT: PRN  OBJECTIVE:  Note: Objective measures were completed at Evaluation unless otherwise noted.  DIAGNOSTIC FINDINGS: N/A  PATIENT SURVEYS:  LEFS: 35/80  COGNITION: Overall cognitive status: Within functional limits for tasks assessed     SENSATION: Light touch: Impaired - R anterior LE  POSTURE: rounded shoulders, forward head, and flexed trunk   PALPATION: No overt TTP noted  LOWER EXTREMITY MMT:  MMT Right eval Left eval  Hip flexion 3+ 3+  Hip extension    Hip abduction 3+ 3+  Hip adduction 4 4  Hip internal rotation    Hip external rotation    Knee flexion 3+ 4  Knee extension 4 4  Ankle dorsiflexion 2+ 4  Ankle  plantarflexion    Ankle inversion    Ankle eversion     (Blank rows = not tested)  LOWER EXTREMITY SPECIAL TESTS:  DNT  FUNCTIONAL TESTS:  30 Second Sit to Stand: 8 reps no UE  GAIT: Distance walked: 62ft Assistive device utilized: None Level of assistance: Complete Independence Comments: flexed trunk   TREATMENT: [facility] Adult PT Treatment:                                                DATE: 09/20/2023 Therapeutic Exercise: Supine QS x 5 - 5" hold Supine SLR x 5 each S/L hip abd x 5 each Bridge x 10 STS x 5  PATIENT EDUCATION:  Education details: eval findings, LEFS, HEP, POC Person educated: Patient Education method: Explanation, Demonstration, and Handouts Education comprehension: verbalized understanding and returned demonstration  HOME EXERCISE PROGRAM: Access Code: [facility]  URL: [email or web address] Date: 09/20/2023 Prepared by: Loral Roch  Exercises - Supine Quadricep Sets  - 1 x daily - 7 x weekly - 2 sets - 10 reps - 5 sec hold - Active Straight Leg Raise with Quad Set  - 1 x daily - 7 x weekly - 3 sets - 10 reps - Sidelying Hip Abduction  - 1 x daily - 7 x weekly - 3 sets - 10 reps - Supine Bridge  - 1 x daily - 7 x weekly - 3 sets - 10 reps - Sit to Stand Without Arm Support  - 1 x daily - 7 x weekly - 3 sets - 10 reps  ASSESSMENT:  CLINICAL IMPRESSION: Patient is a 53 y.o. M who was seen today for physical therapy evaluation and treatment for chronic weakness/deconditioning and R LE pain. Physical findings are consistent with referring provider impression as pt demonstrates LE weakness and decrease in functional mobility. LEFS score shows severe disability in performance of home ADLs and community activities. Pt would benefit from skilled PT working on improving strength and slowly ramping activity tolerance.    OBJECTIVE IMPAIRMENTS: Abnormal gait, decreased activity tolerance, decreased balance, decreased mobility, difficulty walking,  decreased ROM, decreased strength, and pain   ACTIVITY LIMITATIONS: carrying, lifting, sitting, standing, squatting, stairs, transfers, and locomotion level  PARTICIPATION LIMITATIONS: meal prep, cleaning, driving, shopping, community activity, and yard work  PERSONAL FACTORS: Time since onset of injury/illness/exacerbation and 3+ comorbidities: WPW, HTN, Drug/ETOH abuse, Depression, Bipolar Disorder are also affecting patient's functional outcome.   REHAB POTENTIAL: Good  CLINICAL DECISION MAKING: Evolving/moderate complexity  EVALUATION COMPLEXITY: Moderate   GOALS: Goals reviewed with patient? No  SHORT TERM GOALS: Target date: 10/12/2023   Pt will be compliant and knowledgeable with initial HEP for improved comfort and carryover Baseline: initial HEP given  Goal status: INITIAL  2.  Pt will self report R LE pain no greater than 7/10 for improved comfort and functional ability Baseline: 10/10 at worst Goal status: INITIAL   LONG TERM GOALS: Target date: 11/16/2023   Pt will improve LEFS to no less than 48/80 as proxy for functional improvement with home ADLs and higher level community activity Baseline: 35/80 Goal status: INITIAL   2.  Pt will self report R LE pain no greater than 4/10 for improved comfort and functional ability Baseline: 10/10 at worst Goal status: INITIAL   3.  Pt will increase 30 Second Sit to Stand rep count to no less than 10 reps for improved balance, strength, and functional mobility Baseline: 8 reps  Goal status: INITIAL   4.  Pt will improve all LE MMT to at least 4/5 for improved functional mobility and conditioning  Baseline: see MMT chart Goal status: INITIAL  5.  Pt will be able to ambulate for >1 hours for improved functional mobility in community and getting back to PLOF  Baseline: 10 minutes Goal status: INITIAL   PLAN:  PT FREQUENCY: 1x/week  PT DURATION: 8 weeks  PLANNED INTERVENTIONS: 97164- PT Re-evaluation,  97110-Therapeutic exercises, 97530- Therapeutic activity, [id number]- Neuromuscular re-education, 97535- Self Care, 40981- Manual therapy, [id number]- Gait training, [id number]- Electrical stimulation (unattended), [id number]- Electrical stimulation (manual), 97016- Vasopneumatic device, Dry Needling, Cryotherapy, and Moist heat  PLAN FOR NEXT SESSION: assess HEP response, global strengthening and conditioning   For all possible CPT codes, reference the Planned Interventions line above.     Check all conditions that are expected to impact treatment: {Conditions expected to impact treatment:Musculoskeletal disorders  If treatment provided at initial evaluation, no treatment charged due to lack of authorization.       Ivor Mars, PT 09/21/2023, 8:44 AM

## 2023-09-24 ENCOUNTER — Other Ambulatory Visit: Payer: Self-pay

## 2023-09-24 ENCOUNTER — Emergency Department (HOSPITAL_COMMUNITY): Payer: MEDICAID

## 2023-09-24 ENCOUNTER — Emergency Department (HOSPITAL_COMMUNITY)
Admission: EM | Admit: 2023-09-24 | Discharge: 2023-09-25 | Disposition: A | Discharge status: Home / Self Care | Payer: MEDICAID | Attending: Emergency Medicine | Admitting: Emergency Medicine

## 2023-09-24 ENCOUNTER — Encounter (HOSPITAL_COMMUNITY): Payer: Self-pay | Admitting: *Deleted

## 2023-09-24 DIAGNOSIS — K861 Other chronic pancreatitis: Secondary | ICD-10-CM | POA: Diagnosis not present

## 2023-09-24 DIAGNOSIS — R1013 Epigastric pain: Secondary | ICD-10-CM | POA: Diagnosis present

## 2023-09-24 LAB — TROPONIN I (HIGH SENSITIVITY)
Troponin I (High Sensitivity): 5 ng/L (ref <18)
Troponin I (High Sensitivity): 6 ng/L (ref <18)

## 2023-09-24 LAB — CBC
HCT: 38.3 % — ABNORMAL LOW (ref 39.0–52.0)
Hemoglobin: 12 g/dL — ABNORMAL LOW (ref 13.0–17.0)
MCH: 27.9 pg (ref 26.0–34.0)
MCHC: 31.3 g/dL (ref 30.0–36.0)
MCV: 89.1 fL (ref 80.0–100.0)
Platelets: 173 10*3/uL (ref 150–400)
RBC: 4.3 MIL/uL (ref 4.22–5.81)
RDW: 21.2 % — ABNORMAL HIGH (ref 11.5–15.5)
WBC: 8.9 10*3/uL (ref 4.0–10.5)
nRBC: 0 % (ref 0.0–0.2)

## 2023-09-24 LAB — COMPREHENSIVE METABOLIC PANEL WITH GFR
ALT: 34 U/L (ref 0–44)
AST: 47 U/L — ABNORMAL HIGH (ref 15–41)
Albumin: 4.1 g/dL (ref 3.5–5.0)
Alkaline Phosphatase: 82 U/L (ref 38–126)
Anion gap: 11 (ref 5–15)
BUN: 10 mg/dL (ref 6–20)
CO2: 15 mmol/L — ABNORMAL LOW (ref 22–32)
Calcium: 8.6 mg/dL — ABNORMAL LOW (ref 8.9–10.3)
Chloride: 113 mmol/L — ABNORMAL HIGH (ref 98–111)
Creatinine, Ser: 0.87 mg/dL (ref 0.61–1.24)
GFR, Estimated: >60 mL/min (ref >60)
Glucose, Bld: 111 mg/dL — ABNORMAL HIGH (ref 70–99)
Potassium: 4 mmol/L (ref 3.5–5.1)
Sodium: 139 mmol/L (ref 135–145)
Total Bilirubin: 0.9 mg/dL (ref 0.0–1.2)
Total Protein: 7.7 g/dL (ref 6.5–8.1)

## 2023-09-24 LAB — LIPASE, BLOOD: Lipase: 30 U/L (ref 11–51)

## 2023-09-24 MED ORDER — ALUM & MAG HYDROXIDE-SIMETH 200-200-20 MG/5ML PO SUSP
30.0000 mL | Freq: Once | ORAL | Status: AC
  Administered 2023-09-24: 30 mL via ORAL
  Filled 2023-09-24: qty 30

## 2023-09-24 MED ORDER — OXYCODONE-ACETAMINOPHEN 5-325 MG PO TABS
2.0000 | ORAL_TABLET | Freq: Once | ORAL | Status: AC
  Administered 2023-09-24: 2 via ORAL
  Filled 2023-09-24: qty 2

## 2023-09-24 NOTE — ED Triage Notes (Signed)
 Pt here via chest pain x 45 minutes.  Vomiting and diarrhea since last night.  EKG showed some pvc's.    Pt not given nitroglycerin  d/t recent ED meds.  Not given asa d/t ulcers.  Pt took carafate  with no relief.  Pt unable to keep down his oxycodone .   20 L ac  Bp 154/70 Hr 100 Sats 99% ra Cbg 122 Temp 98.2 Rr 18

## 2023-09-24 NOTE — ED Provider Triage Note (Signed)
 Emergency Medicine Provider Triage Evaluation Note  [name] , a 53 y.o. male  was evaluated in triage.  Pt complains of epigastric pain.  Pt has a history of pancreatitis. Pt vomited meds today.   Review of Systems  Positive: vomiting Negative: fever  Physical Exam  BP (!) 148/108 (BP Location: Right Arm)   Pulse (!) 107   Temp 98.8 F (37.1 C) (Oral)   Resp 16   Ht 5\' 8"  (1.727 m)   Wt 57.2 kg   SpO2 100%   BMI 19.16 kg/m  Gen:   Awake, no distress   Resp:  Normal effort  MSK:   Moves extremities without difficulty  Other:    Medical Decision Making  Medically screening exam initiated at 6:46 PM.  Appropriate orders placed.  [name] was informed that the remainder of the evaluation will be completed by another provider, this initial triage assessment does not replace that evaluation, and the importance of remaining in the ED until their evaluation is complete.     Sandi Crosby, PA-C 09/24/23 [phone]

## 2023-09-25 ENCOUNTER — Other Ambulatory Visit: Payer: Self-pay | Admitting: Cardiology

## 2023-09-25 MED ORDER — OXYCODONE HCL 5 MG PO TABS: 5.0000 mg | ORAL_TABLET | ORAL | 0 refills | Status: DC | PRN

## 2023-09-25 MED ORDER — LOPERAMIDE HCL 2 MG PO CAPS: 4.0000 mg | ORAL_CAPSULE | ORAL | 0 refills | Status: DC | PRN

## 2023-09-25 MED ORDER — LIDOCAINE VISCOUS HCL 2 % MT SOLN
15.0000 mL | Freq: Two times a day (BID) | OROMUCOSAL | 0 refills | Status: DC | PRN
Start: 2023-09-25 — End: 2023-09-25

## 2023-09-25 MED ORDER — PANTOPRAZOLE SODIUM 40 MG IV SOLR
40.0000 mg | Freq: Once | INTRAVENOUS | Status: AC
Start: 2023-09-25 — End: 2023-09-25
  Administered 2023-09-25: 40 mg via INTRAVENOUS
  Filled 2023-09-25: qty 10

## 2023-09-25 MED ORDER — LIDOCAINE VISCOUS HCL 2 % MT SOLN: 15.0000 mL | Freq: Two times a day (BID) | OROMUCOSAL | 0 refills | Status: DC | PRN

## 2023-09-25 MED ORDER — HYDROMORPHONE HCL 1 MG/ML IJ SOLN
0.5000 mg | Freq: Once | INTRAMUSCULAR | Status: AC
  Administered 2023-09-25: 0.5 mg via INTRAVENOUS
  Filled 2023-09-25: qty 1

## 2023-09-25 MED ORDER — ONDANSETRON HCL 4 MG/2ML IJ SOLN
4.0000 mg | Freq: Once | INTRAMUSCULAR | Status: AC
  Administered 2023-09-25: 4 mg via INTRAVENOUS
  Filled 2023-09-25: qty 2

## 2023-09-25 MED ORDER — OXYCODONE HCL 5 MG PO TABS
5.0000 mg | ORAL_TABLET | ORAL | 0 refills | Status: DC | PRN
Start: 2023-09-25 — End: 2023-10-03

## 2023-09-25 MED ORDER — LACTATED RINGERS IV BOLUS
1000.0000 mL | Freq: Once | INTRAVENOUS | Status: AC
  Administered 2023-09-25: 1000 mL via INTRAVENOUS

## 2023-09-25 MED ORDER — LIDOCAINE VISCOUS HCL 2 % MT SOLN
15.0000 mL | Freq: Once | OROMUCOSAL | Status: AC
  Administered 2023-09-25: 15 mL via OROMUCOSAL
  Filled 2023-09-25: qty 15

## 2023-09-25 NOTE — ED Provider Notes (Signed)
 [facility]-EMERGENCY DEPT [facility] Emergency Department Provider Note MRN:  [id number]  Arrival date & time: 09/25/23     Chief Complaint   Chest Pain   History of Present Illness   [name] is a 53 y.o. year-old male presents to the ED with chief complaint of his typical pancreatitis pain.  He states that his symptoms flared up earlier today.  He states that he has been having associated nausea, vomiting, and diarrhea today.  He states that he has been trying to take his Carafate , Creon , and oxycodone , but cannot keep it down.  He denies any other symptoms.  He rates his pain as moderate to severe.[name]  History provided by patient.   Review of Systems  Pertinent positive and negative review of systems noted in HPI.    Physical Exam   Vitals:   09/24/23 2318 09/25/23 0323  BP: (!) 134/100 (!) 171/124  Pulse: (!) 102 (!) 109  Resp: 17 13  Temp: (!) 97.5 F (36.4 C) 97.7 F (36.5 C)  SpO2: 100% 98%    CONSTITUTIONAL:  uncomfortable-appearing, NAD NEURO:  Alert and oriented x 3, CN 3-12 grossly intact EYES:  eyes equal and reactive ENT/NECK:  Supple, no stridor  CARDIO:  tachycardic, regular rhythm, appears well-perfused  PULM:  No respiratory distress, CTAB GI/GU:  non-distended, generalized abdominal discomfort MSK/SPINE:  No gross deformities, no edema, moves all extremities  SKIN:  no rash, atraumatic   *Additional and/or pertinent findings included in MDM below  Diagnostic and Interventional Summary    EKG Interpretation Date/Time:  Sunday Sep 24 2023 18:36:52 EDT Ventricular Rate:  96 PR Interval:  104 QRS Duration:  76 QT Interval:  334 QTC Calculation: 421 R Axis:   74  Text Interpretation: Sinus rhythm with short PR with Premature atrial complexes with Abberant conduction Biatrial enlargement Abnormal ECG When compared with ECG of 11-Sep-2023 12:31, PREVIOUS ECG IS PRESENT Confirmed by Eve Hinders [phone]) on 09/25/2023 12:14:39 AM        Labs Reviewed  CBC - Abnormal; Notable for the following components:      Result Value   Hemoglobin 12.0 (*)    HCT 38.3 (*)    RDW 21.2 (*)    All other components within normal limits  COMPREHENSIVE METABOLIC PANEL WITH GFR - Abnormal; Notable for the following components:   Chloride 113 (*)    CO2 15 (*)    Glucose, Bld 111 (*)    Calcium  8.6 (*)    AST 47 (*)    All other components within normal limits  LIPASE, BLOOD  TROPONIN I (HIGH SENSITIVITY)  TROPONIN I (HIGH SENSITIVITY)    DG Chest 2 View  Final Result      Medications  alum & mag hydroxide-simeth (MAALOX/MYLANTA) 200-200-20 MG/5ML suspension 30 mL (30 mLs Oral Given 09/24/23 1858)  oxyCODONE -acetaminophen  (PERCOCET/ROXICET) 5-325 MG per tablet 2 tablet (2 tablets Oral Given 09/24/23 1859)  HYDROmorphone  (DILAUDID ) injection 0.5 mg (0.5 mg Intravenous Given 09/25/23 0105)  pantoprazole  (PROTONIX ) injection 40 mg (40 mg Intravenous Given 09/25/23 0105)  lidocaine  (XYLOCAINE ) 2 % viscous mouth solution 15 mL (15 mLs Mouth/Throat Given 09/25/23 0105)  ondansetron  (ZOFRAN ) injection 4 mg (4 mg Intravenous Given 09/25/23 0105)  lactated ringers  bolus 1,000 mL (0 mLs Intravenous Stopped 09/25/23 0325)  HYDROmorphone  (DILAUDID ) injection 0.5 mg (0.5 mg Intravenous Given 09/25/23 0318)     Procedures  /  Critical Care Procedures  ED Course and Medical Decision Making  I have reviewed  the triage vital signs, the nursing notes, and pertinent available records from the EMR.  Social Determinants Affecting Complexity of Care: Patient has no clinically significant social determinants affecting this chief complaint..   ED Course:    Medical Decision Making Patient returns to the emergency department with epigastric abdominal pain.  He states this feels like his pancreatitis.  Patient has history of chronic pancreatitis.  He states that he has had some nausea and vomiting as well as diarrhea.  On my initial exam, patient  appeared somewhat uncomfortable.  After fluids, medication and treatment in the ED, he appears much more comfortable.  He is tolerating oral intake.  Labs are about baseline for patient.  I do not think that he needs another CT scan, since he has had so many and his symptoms are consistent with his typical acute on chronic pancreatitis.  Given that he is now tolerating fluids and his pain is improved, I feel that he can be safely discharged home.  He states that he has follow-up with GI in the coming month.  Amount and/or Complexity of Data Reviewed Labs: ordered. Radiology: ordered.  Risk Prescription drug management.         Consultants: No consultations were needed in caring for this patient.   Treatment and Plan: I considered admission due to patient's initial presentation, but after considering the examination and diagnostic results, patient will not require admission and can be discharged with outpatient follow-up.    Final Clinical Impressions(s) / ED Diagnoses     ICD-10-CM   1. Chronic pancreatitis, unspecified pancreatitis type (HCC)  K86.1       ED Discharge Orders          Ordered    oxyCODONE  (ROXICODONE ) 5 MG immediate release tablet  Every 4 hours PRN,   Status:  Discontinued        09/25/23 0257    loperamide  (IMODIUM ) 2 MG capsule  As needed,   Status:  Discontinued        09/25/23 0257    lidocaine  (XYLOCAINE ) 2 % solution  Every 12 hours PRN,   Status:  Discontinued        09/25/23 0257    lidocaine  (XYLOCAINE ) 2 % solution  Every 12 hours PRN        09/25/23 0350    loperamide  (IMODIUM ) 2 MG capsule  As needed        09/25/23 0350    oxyCODONE  (ROXICODONE ) 5 MG immediate release tablet  Every 4 hours PRN        09/25/23 0350              Discharge Instructions Discussed with and Provided to Patient:   Discharge Instructions   None      Sherel Dikes, PA-C 09/25/23 0553    Eve Hinders, MD 09/26/23 [phone]

## 2023-10-02 IMAGING — CT CT ABD-PELV W/O CM
2 of 4 series · 15 of 46 positions shown, 17 images · non-contrast
Comparison: CT without contrast 12/02/2020, 10/30/2020

CLINICAL DATA: abdominal pain with history of pancreatitis.

EXAM:
CT ABDOMEN AND PELVIS WITHOUT CONTRAST
TECHNIQUE: Multidetector CT imaging of the abdomen and pelvis was performed
following the standard protocol without IV contrast.

[Series 2: axial st · axial · 0.72mm/px · z∈[+962,+1362]mm · 12 of 90 slices shown, 14 images]
[im 5/90  soft-tissue]
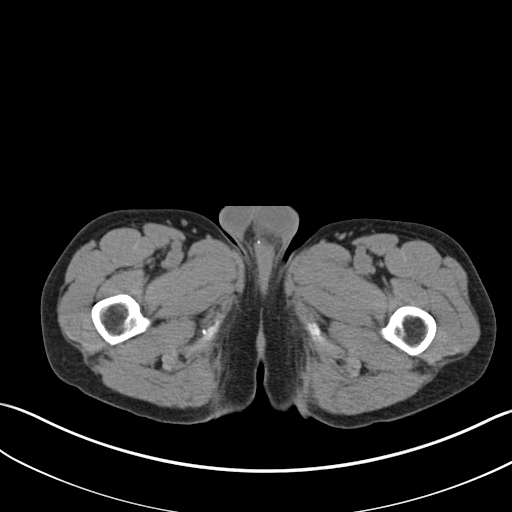
[im 5/90  bone]
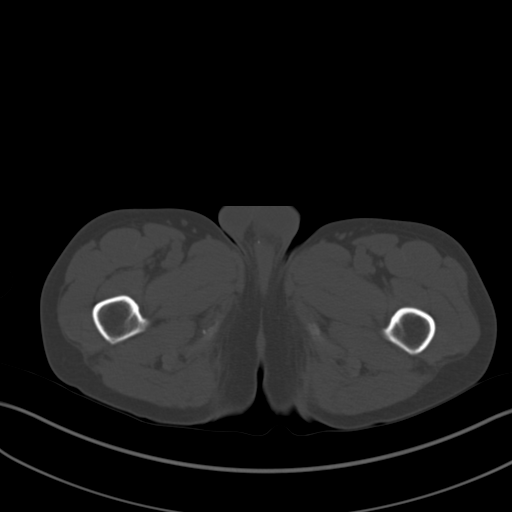
[im 15/90  soft-tissue]
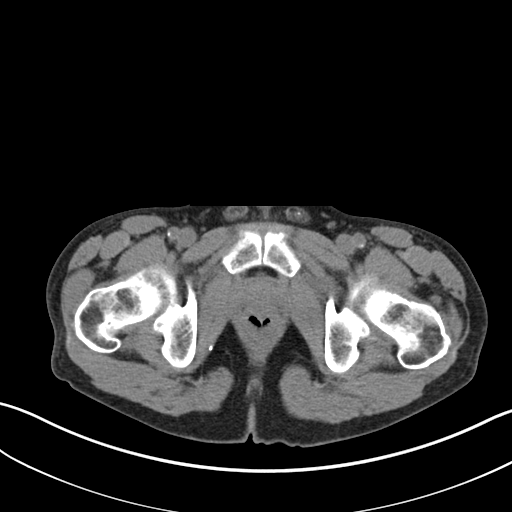
[im 20/90  soft-tissue]
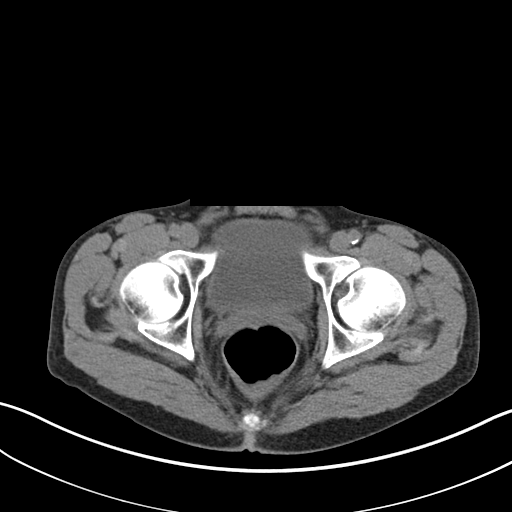
[im 25/90  soft-tissue]
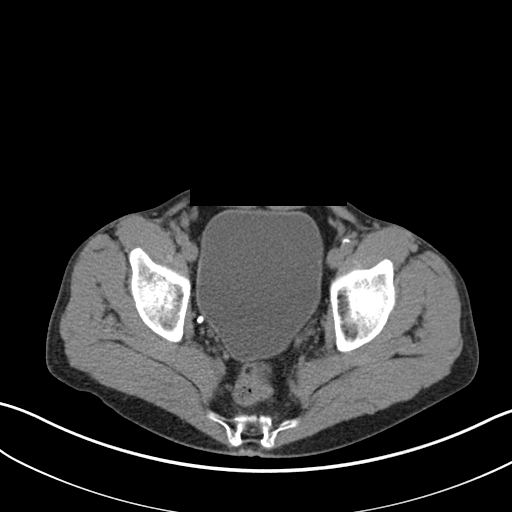
[im 35/90  soft-tissue]
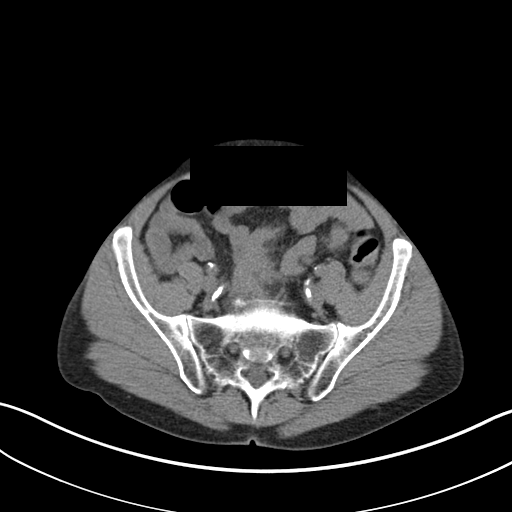
[im 40/90  soft-tissue]
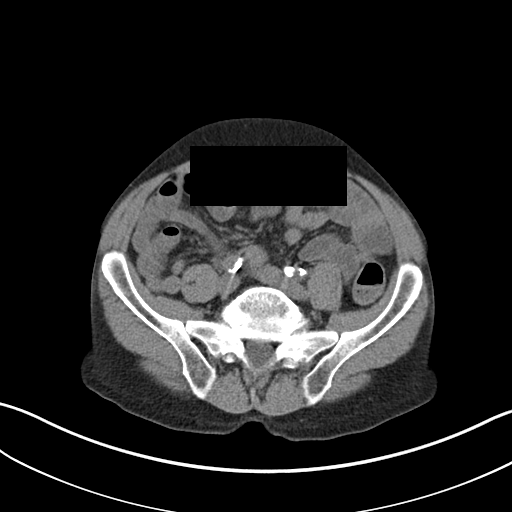
[im 50/90  soft-tissue]
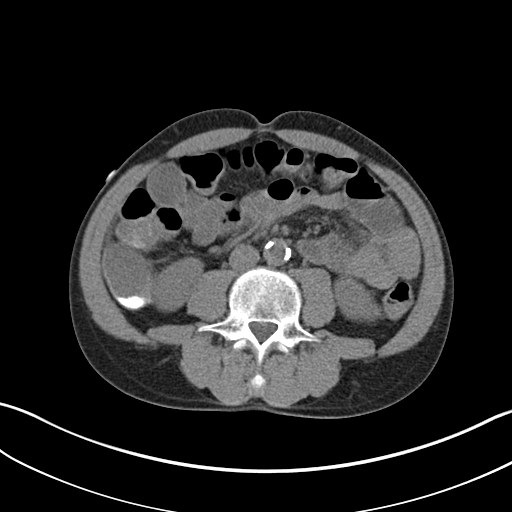
[im 55/90  soft-tissue]
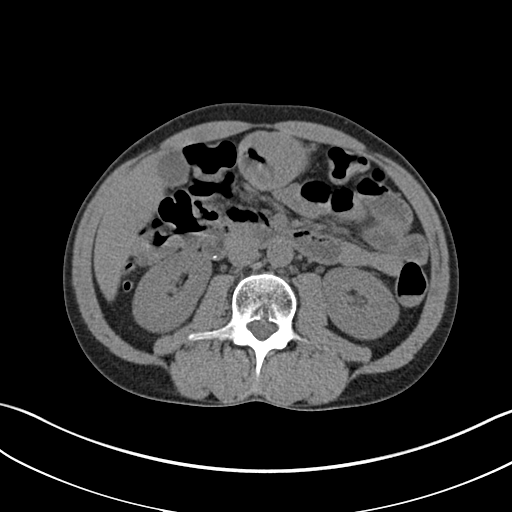
[im 65/90  soft-tissue]
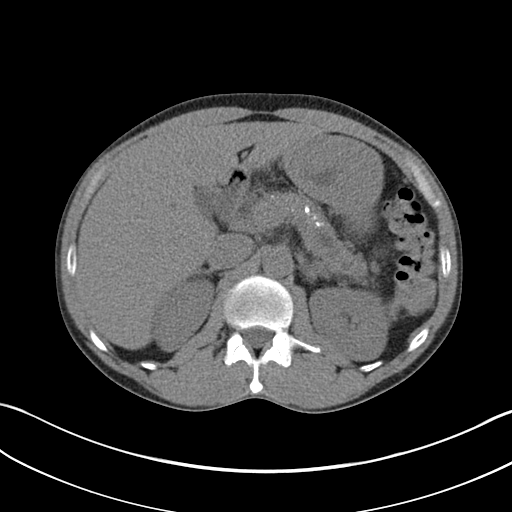
[im 65/90  bone]
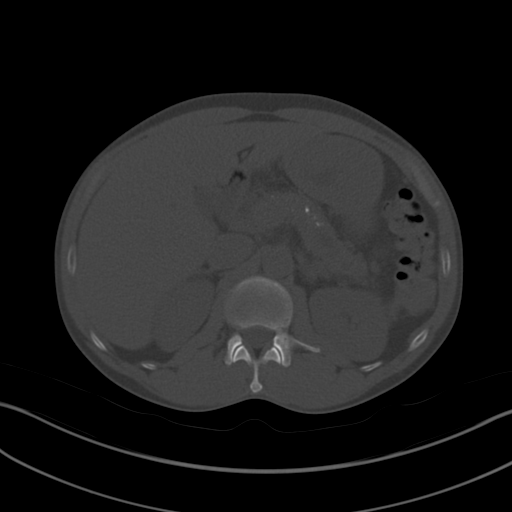
[im 70/90  soft-tissue]
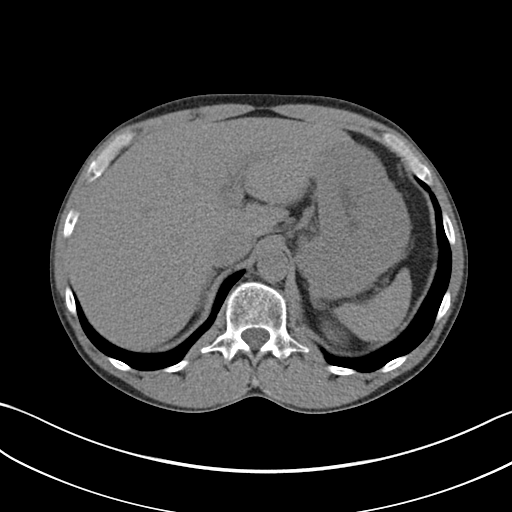
[im 75/90  soft-tissue]
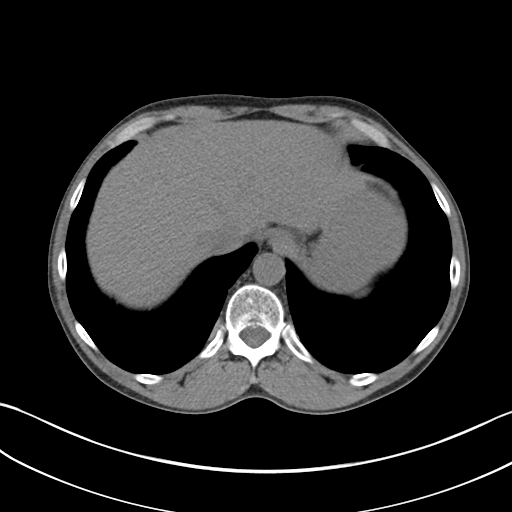
[im 85/90  soft-tissue]
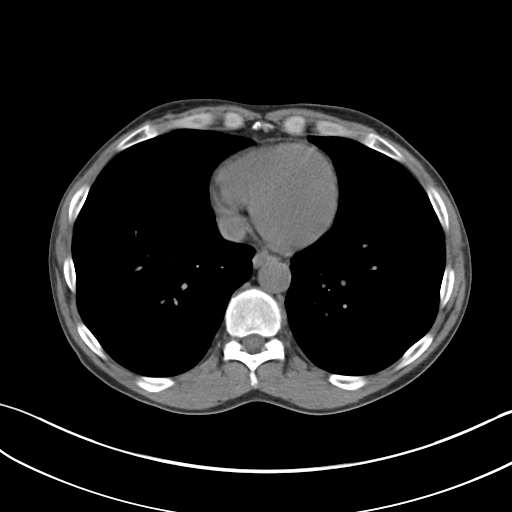

[Series 4: coronal st · coronal · 0.69mm/px · 3 of 143 slices shown]
[im 48/143  soft-tissue]
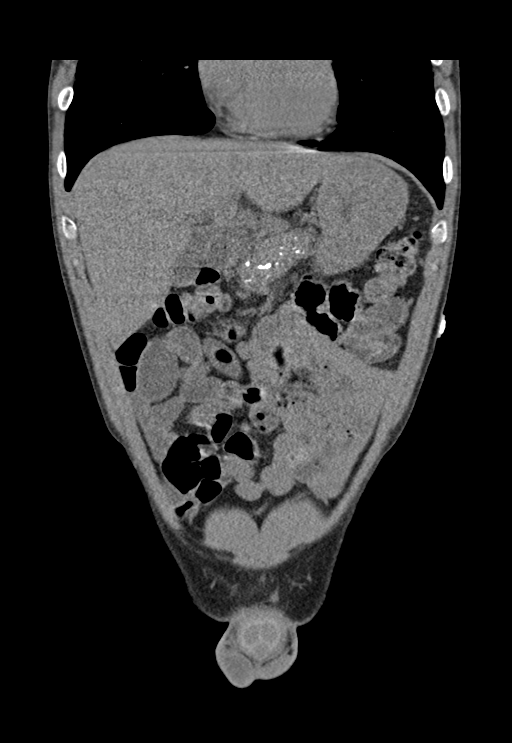
[im 64/143  soft-tissue]
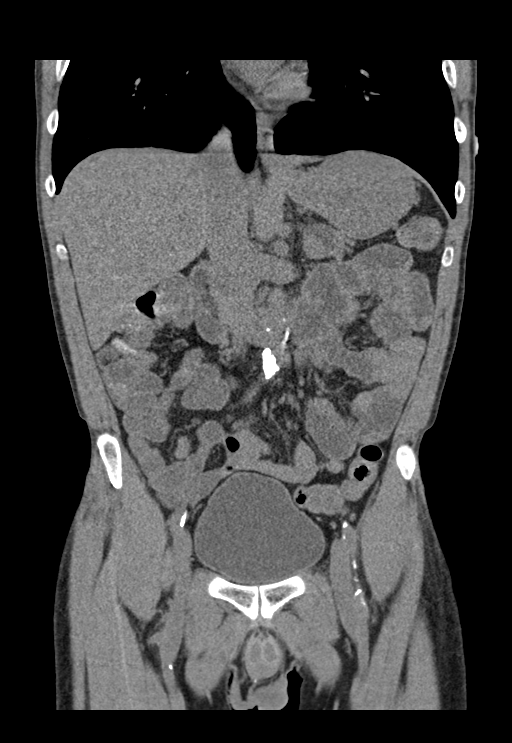
[im 79/143  soft-tissue]
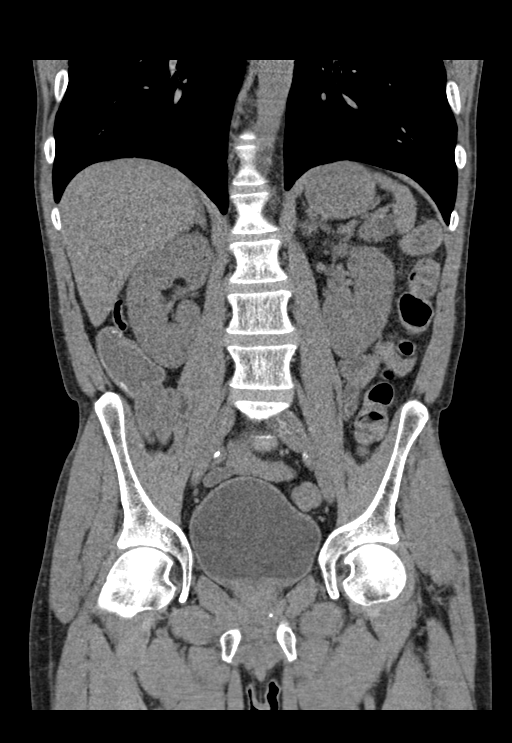

[15 of 46 positions shown; findings below may reference images not displayed]

FINDINGS: Lower chest: No acute abnormality.

Hepatobiliary: No focal liver abnormality is seen. No gallstones,
gallbladder wall thickening, or biliary dilatation.

Pancreas: Coarse chronic pancreatic calcifications consistent with
chronic calcific pancreatitis are again noted. There is trace
stranding along the distal body and tail of the pancreas but no
other peripancreatic stranding is seen. The peripancreatic edema was
greater previously.

1.8 cm cystic lesion in the proximal pancreatic tail was previously
2.0 cm. 1.3 cm cystic lesion abutting the distal tail previously
measured 1.6 cm. There is mild ductal dilatation in the tail which
has also improved.

Spleen: Normal size and attenuation.

Adrenals/Urinary Tract: Unremarkable unenhanced adrenal glands and
kidneys. No hydronephrosis, stone or bladder mass without contrast.

Stomach/Bowel: Chronic circumferential gastric thickening. There is
fluid filling of small bowel without dilatation, fluid in the
ascending colon. Colonic diverticula without evidence of
diverticulitis or small bowel inflammation. Normal appendix.

Vascular/Lymphatic: Aortic atherosclerosis. No enlarged abdominal or
pelvic lymph nodes.

Reproductive: Normal prostate size.

Other: No free air, hemorrhage or fluid.

Musculoskeletal: Degenerative disc disease, spondylosis and
circumferential bulging disc, L5-S1. No worrisome regional skeletal
lesion.
IMPRESSION: 1. Chronic calcific pancreatitis, today with only trace stranding at
the tail of pancreas, with the peripancreatic edema more pronounced
on both prior studies and with decreased size of presumed
pseudocysts.
2. Chronically thickened stomach, and today with fluid filling of
normal caliber small bowel and ascending colon which could be due to
an ileus or nonspecific enteritis. There is no bowel obstruction or
inflammation. Diverticulosis.

## 2023-10-03 ENCOUNTER — Emergency Department (HOSPITAL_COMMUNITY)
Admission: EM | Admit: 2023-10-03 | Discharge: 2023-10-03 | Disposition: A | Discharge status: Home / Self Care | Payer: MEDICAID | Attending: Emergency Medicine | Admitting: Emergency Medicine

## 2023-10-03 ENCOUNTER — Encounter (HOSPITAL_COMMUNITY): Payer: Self-pay

## 2023-10-03 DIAGNOSIS — R000 Tachycardia, unspecified: Secondary | ICD-10-CM | POA: Insufficient documentation

## 2023-10-03 DIAGNOSIS — R109 Unspecified abdominal pain: Secondary | ICD-10-CM | POA: Diagnosis present

## 2023-10-03 DIAGNOSIS — G8929 Other chronic pain: Secondary | ICD-10-CM | POA: Diagnosis not present

## 2023-10-03 DIAGNOSIS — R197 Diarrhea, unspecified: Secondary | ICD-10-CM | POA: Diagnosis not present

## 2023-10-03 DIAGNOSIS — R1084 Generalized abdominal pain: Secondary | ICD-10-CM | POA: Insufficient documentation

## 2023-10-03 DIAGNOSIS — R112 Nausea with vomiting, unspecified: Secondary | ICD-10-CM | POA: Diagnosis not present

## 2023-10-03 LAB — COMPREHENSIVE METABOLIC PANEL WITH GFR
ALT: 41 U/L (ref 0–44)
AST: 99 U/L — ABNORMAL HIGH (ref 15–41)
Albumin: 4 g/dL (ref 3.5–5.0)
Alkaline Phosphatase: 83 U/L (ref 38–126)
Anion gap: 9 (ref 5–15)
BUN: 8 mg/dL (ref 6–20)
CO2: 17 mmol/L — ABNORMAL LOW (ref 22–32)
Calcium: 8.4 mg/dL — ABNORMAL LOW (ref 8.9–10.3)
Chloride: 111 mmol/L (ref 98–111)
Creatinine, Ser: 0.97 mg/dL (ref 0.61–1.24)
GFR, Estimated: >60 mL/min (ref >60)
Glucose, Bld: 110 mg/dL — ABNORMAL HIGH (ref 70–99)
Potassium: 4 mmol/L (ref 3.5–5.1)
Sodium: 137 mmol/L (ref 135–145)
Total Bilirubin: 1 mg/dL (ref 0.0–1.2)
Total Protein: 7.4 g/dL (ref 6.5–8.1)

## 2023-10-03 LAB — CBC WITH DIFFERENTIAL/PLATELET
Abs Immature Granulocytes: 0.04 10*3/uL (ref 0.00–0.07)
Basophils Absolute: 0 10*3/uL (ref 0.0–0.1)
Basophils Relative: 0 %
Eosinophils Absolute: 0.1 10*3/uL (ref 0.0–0.5)
Eosinophils Relative: 2 %
HCT: 36.2 % — ABNORMAL LOW (ref 39.0–52.0)
Hemoglobin: 11.7 g/dL — ABNORMAL LOW (ref 13.0–17.0)
Immature Granulocytes: 1 %
Lymphocytes Relative: 33 %
Lymphs Abs: 2.7 10*3/uL (ref 0.7–4.0)
MCH: 28.5 pg (ref 26.0–34.0)
MCHC: 32.3 g/dL (ref 30.0–36.0)
MCV: 88.1 fL (ref 80.0–100.0)
Monocytes Absolute: 0.8 10*3/uL (ref 0.1–1.0)
Monocytes Relative: 9 %
Neutro Abs: 4.6 10*3/uL (ref 1.7–7.7)
Neutrophils Relative %: 55 %
Platelets: 199 10*3/uL (ref 150–400)
RBC: 4.11 MIL/uL — ABNORMAL LOW (ref 4.22–5.81)
RDW: 20.5 % — ABNORMAL HIGH (ref 11.5–15.5)
WBC: 8.3 10*3/uL (ref 4.0–10.5)
nRBC: 0 % (ref 0.0–0.2)

## 2023-10-03 LAB — LIPASE, BLOOD: Lipase: 28 U/L (ref 11–51)

## 2023-10-03 MED ORDER — PANTOPRAZOLE SODIUM 20 MG PO TBEC: 20.0000 mg | DELAYED_RELEASE_TABLET | Freq: Every day | ORAL | 0 refills | Status: DC

## 2023-10-03 MED ORDER — LIDOCAINE VISCOUS HCL 2 % MT SOLN
15.0000 mL | Freq: Once | OROMUCOSAL | Status: AC
  Administered 2023-10-03: 15 mL via OROMUCOSAL
  Filled 2023-10-03: qty 15

## 2023-10-03 MED ORDER — LACTATED RINGERS IV BOLUS
1000.0000 mL | Freq: Once | INTRAVENOUS | Status: AC
  Administered 2023-10-03: 1000 mL via INTRAVENOUS

## 2023-10-03 MED ORDER — MORPHINE SULFATE (PF) 4 MG/ML IV SOLN
4.0000 mg | Freq: Once | INTRAVENOUS | Status: AC
  Administered 2023-10-03: 4 mg via INTRAVENOUS
  Filled 2023-10-03: qty 1

## 2023-10-03 MED ORDER — MORPHINE SULFATE (PF) 4 MG/ML IV SOLN
6.0000 mg | Freq: Once | INTRAVENOUS | Status: AC
  Administered 2023-10-03: 6 mg via INTRAVENOUS
  Filled 2023-10-03: qty 2

## 2023-10-03 MED ORDER — OXYCODONE HCL 5 MG PO TABS: 5.0000 mg | ORAL_TABLET | Freq: Four times a day (QID) | ORAL | 0 refills | Status: DC | PRN

## 2023-10-03 NOTE — ED Provider Notes (Signed)
 [facility] Emergency Department Provider Note MRN:  [id number]  Arrival date & time: 10/03/23     Chief Complaint   Abdominal Pain   History of Present Illness   [name] is a 53 y.o. year-old male presents to the ED with chief complaint of abdominal pain.  Hx of chronic pancreatitis.  Report new nausea and vomiting that started after eating a burger from [employer].  He states that he has had 2 episodes of vomiting.  He has now established care with the pain clinic.  He has an upcoming procedure to "cut the nerve to my pancreas."  Denies any other new complaints.  Still complains of chronic diarrhea.  History provided by patient.   Review of Systems  Pertinent positive and negative review of systems noted in HPI.    Physical Exam   Vitals:   10/03/23 0242  BP: (!) 176/111  Pulse: (!) 101  Resp: 18  Temp: 98.6 F (37 C)  SpO2: 100%    CONSTITUTIONAL:  non toxic-appearing, NAD NEURO:  Alert and oriented x 3, CN 3-12 grossly intact EYES:  eyes equal and reactive ENT/NECK:  Supple, no stridor  CARDIO:  mildly tachycardic, regular rhythm, appears well-perfused  PULM:  No respiratory distress, CTAB GI/GU:  non-distended, generalized abdominal discomfort MSK/SPINE:  No gross deformities, no edema, moves all extremities  SKIN:  no rash, atraumatic   *Additional and/or pertinent findings included in MDM below  Diagnostic and Interventional Summary    EKG Interpretation Date/Time:    Ventricular Rate:    PR Interval:    QRS Duration:    QT Interval:    QTC Calculation:   R Axis:      Text Interpretation:         Labs Reviewed  COMPREHENSIVE METABOLIC PANEL WITH GFR - Abnormal; Notable for the following components:      Result Value   CO2 17 (*)    Glucose, Bld 110 (*)    Calcium  8.4 (*)    AST 99 (*)    All other components within normal limits  CBC WITH DIFFERENTIAL/PLATELET - Abnormal; Notable for the following  components:   RBC 4.11 (*)    Hemoglobin 11.7 (*)    HCT 36.2 (*)    RDW 20.5 (*)    All other components within normal limits  LIPASE, BLOOD    No orders to display    Medications  morphine  (PF) 4 MG/ML injection 6 mg (6 mg Intravenous Given 10/03/23 0335)  lactated ringers  bolus 1,000 mL (1,000 mLs Intravenous New Bag/Given 10/03/23 0338)  lidocaine  (XYLOCAINE ) 2 % viscous mouth solution 15 mL (15 mLs Mouth/Throat Given 10/03/23 0338)  morphine  (PF) 4 MG/ML injection 4 mg (4 mg Intravenous Given 10/03/23 0430)     Procedures  /  Critical Care Procedures  ED Course and Medical Decision Making  I have reviewed the triage vital signs, the nursing notes, and pertinent available records from the EMR.  Social Determinants Affecting Complexity of Care: Patient has no clinically significant social determinants affecting this chief complaint..   ED Course:    Medical Decision Making Patient returns again with his acute on chronic pancreatitis symptoms.  He complains of associated nausea, vomiting, and diarrhea after eating at [employer].  He states that his symptoms are no different from his typical pancreatitis flares.  He is now under the care of pain management.  I will give him some fluids, antiemetics, and attempt to manage his pain.  Patient feeling better after treatment in the ED.  He does still have a few pills of his oxycodone  left.  It does appear that he has been taking them only as needed and not more frequently than needed.  He is scheduled to see the pain clinic on Thursday.  He asks for a few more pain pills, I feel that this is reasonable.  Will also refill his Protonix .  Labs are about baseline.  Feel that he is stable for discharge home.  Amount and/or Complexity of Data Reviewed Labs: ordered.  Risk Prescription drug management.         Consultants: No consultations were needed in caring for this patient.   Treatment and Plan: I considered admission due to  patient's initial presentation, but after considering the examination and diagnostic results, patient will not require admission and can be discharged with outpatient follow-up.    Final Clinical Impressions(s) / ED Diagnoses     ICD-10-CM   1. Chronic abdominal pain  R10.9    G89.29       ED Discharge Orders          Ordered    oxyCODONE  (ROXICODONE ) 5 MG immediate release tablet  Every 6 hours PRN        10/03/23 0421    pantoprazole  (PROTONIX ) 20 MG tablet  Daily        10/03/23 0421              Discharge Instructions Discussed with and Provided to Patient:   Discharge Instructions   None      Sherel Dikes, PA-C 10/03/23 0431    Edson Graces, MD 10/03/23 [phone]

## 2023-10-03 NOTE — ED Triage Notes (Signed)
 Pt BIB [facility] from home d/t LUQ ABD pain - hx of Pancreatitis - N/V/D after eating [employer]  BP 170/94 HR 96 100 O2 CBG 150 RR 18 20g  LFA  4 mg Zofran 

## 2023-10-04 ENCOUNTER — Ambulatory Visit: Payer: MEDICAID

## 2023-10-04 DIAGNOSIS — M6281 Muscle weakness (generalized): Secondary | ICD-10-CM | POA: Diagnosis not present

## 2023-10-04 DIAGNOSIS — R2689 Other abnormalities of gait and mobility: Secondary | ICD-10-CM

## 2023-10-04 DIAGNOSIS — M79661 Pain in right lower leg: Secondary | ICD-10-CM

## 2023-10-04 NOTE — Therapy (Addendum)
 OUTPATIENT PHYSICAL THERAPY TREATMENT NOTE/DISCHARGE  PHYSICAL THERAPY DISCHARGE SUMMARY  Visits from Start of Care: 2  Current functional level related to goals / functional outcomes: See goals/objective   Remaining deficits: Unable to assess   Education / Equipment: HEP   Patient agrees to discharge. Patient goals were unable to assess. Patient is being discharged due to not returning since the last visit.     Patient Name: [name] MRN: [id number] DOB:05-06-1971, 53 y.o., male Today's Date: 10/04/2023  END OF SESSION:  PT End of Session - 10/04/23 1320     Visit Number 2    Number of Visits 9    Date for PT Re-Evaluation 11/16/23    Authorization Type [employer]    PT Start Time 1320   arrived late   PT Stop Time 1400    PT Time Calculation (min) 40 min    Activity Tolerance Patient tolerated treatment well    Behavior During Therapy [facility] for tasks assessed/performed              Past Medical History:  Diagnosis Date   Alcoholism /alcohol  abuse    Anemia    Anxiety    Arthritis    knees; arms; elbows (03/26/2015)   Asthma    Bipolar disorder (HCC)    Chronic bronchitis (HCC)    Chronic lower back pain    Chronic pancreatitis (HCC)    Cocaine abuse (HCC)    Depression    Family history of adverse reaction to anesthesia    Femoral condyle fracture (HCC) 03/08/2014   left medial/notes 03/09/2014   GERD (gastroesophageal reflux disease)    H/O hiatal hernia    H/O suicide attempt 10/2012   High cholesterol    History of blood transfusion 10/2012   when I tried to commit suicide   History of stomach ulcers    Hypertension    Marijuana abuse, continuous    Migraine    a few times/year (03/26/2015)   Pancreatitis    Pneumonia 1990's X 3   PTSD (post-traumatic stress disorder)    Seizures (HCC)    Sickle cell trait (HCC)    WPW (Wolff-Parkinson-White syndrome)    thelbert 03/06/2013   Past Surgical History:  Procedure Laterality  Date   BIOPSY  11/25/2017   Procedure: BIOPSY;  Surgeon: Burnette Fallow, MD;  [facility] END[facility];  Service: Endoscopy;;   BIOPSY  10/14/2018   Procedure: BIOPSY;  Surgeon: Burnette Fallow, MD;  Location: [facility] ENDOSCOPY;  Service: Endoscopy;;   BIOPSY  03/06/2023   Procedure: BIOPSY;  Surgeon: Wilhelmenia Aloha Raddle., MD;  Location: [facility];  Service: Gastroenterology;;   CARDIAC CATHETERIZATION     CYST ENTEROSTOMY  01/02/2020   Procedure: CYST ASPIRATION;  Surgeon: Teressa Toribio SQUIBB, MD;  Location: [facility] ENDOSCOPY;  Service: Endoscopy;;   ESOPHAGOGASTRODUODENOSCOPY N/A 03/06/2023   Procedure: ESOPHAGOGASTRODUODENOSCOPY (EGD);  Surgeon: Wilhelmenia Aloha Raddle., MD;  Location: [facility];  Service: Gastroenterology;  Laterality: N/A;   ESOPHAGOGASTRODUODENOSCOPY (EGD) WITH PROPOFOL  N/A 11/25/2017   Procedure: ESOPHAGOGASTRODUODENOSCOPY (EGD) WITH PROPOFOL ;  Surgeon: Burnette Fallow, MD;  [facility];  Service: Endoscopy;  Laterality: N/A;   ESOPHAGOGASTRODUODENOSCOPY (EGD) WITH PROPOFOL  Left 10/14/2018   Procedure: ESOPHAGOGASTRODUODENOSCOPY (EGD) WITH PROPOFOL ;  Surgeon: Burnette Fallow, MD;  Location: [facility] ENDOSCOPY;  Service: Endoscopy;  Laterality: Left;   ESOPHAGOGASTRODUODENOSCOPY (EGD) WITH PROPOFOL  N/A 11/14/2018   Procedure: ESOPHAGOGASTRODUODENOSCOPY (EGD) WITH PROPOFOL ;  Surgeon: Celestia Agent, MD;  Location: [facility] ENDOSCOPY;  Service: Gastroenterology;  Laterality: N/A;  ESOPHAGOGASTRODUODENOSCOPY (EGD) WITH PROPOFOL  N/A 01/02/2020   Procedure: ESOPHAGOGASTRODUODENOSCOPY (EGD) WITH PROPOFOL ;  Surgeon: Teressa Toribio SQUIBB, MD;  Location: [facility] ENDOSCOPY;  Service: Endoscopy;  Laterality: N/A;   ESOPHAGOGASTRODUODENOSCOPY (EGD) WITH PROPOFOL  N/A 10/25/2020   Procedure: ESOPHAGOGASTRODUODENOSCOPY (EGD) WITH PROPOFOL ;  Surgeon: Wilhelmenia Aloha Raddle., MD;  Location: [facility] ENDOSCOPY;  Service: Gastroenterology;  Laterality: N/A;   EUS N/A 01/02/2020   Procedure: UPPER ENDOSCOPIC ULTRASOUND  (EUS) RADIAL;  Surgeon: Teressa Toribio SQUIBB, MD;  Location: [facility] ENDOS[facility];  Service: Endoscopy;  Laterality: N/A;   EUS N/A 03/06/2023   Procedure: UPPER ENDOSCOPIC ULTRASOUND (EUS) RADIAL;  Surgeon: Wilhelmenia Aloha Raddle., MD;  Location: [facility] ENDOS[facility];  Service: Gastroenterology;  Laterality: N/A;   EYE SURGERY Left 1990's   result of trauma    FACIAL FRACTURE SURGERY Left 1990's   result of trauma    FINE NEEDLE ASPIRATION N/A 03/06/2023   Procedure: FINE NEEDLE ASPIRATION (FNA) LINEAR;  Surgeon: Wilhelmenia Aloha Raddle., MD;  Location: [facility] ENDOSCOPY;  Service: Gastroenterology;  Laterality: N/A;   FLEXIBLE SIGMOIDOSCOPY N/A 10/25/2020   Procedure: FLEXIBLE SIGMOIDOSCOPY;  Surgeon: Wilhelmenia Aloha Raddle., MD;  Location: [facility] ENDOSCOPY;  Service: Gastroenterology;  Laterality: N/A;   FRACTURE SURGERY     HEMOSTASIS CLIP PLACEMENT  10/25/2020   Procedure: HEMOSTASIS CLIP PLACEMENT;  Surgeon: Wilhelmenia Aloha Raddle., MD;  Location: [facility] ENDOSCOPY;  Service: Gastroenterology;;   HERNIA REPAIR     HOT HEMOSTASIS N/A 03/06/2023   Procedure: HOT HEMOSTASIS (ARGON PLASMA COAGULATION/BICAP);  Surgeon: Wilhelmenia Aloha Raddle., MD;  Location: [facility];  Service: Gastroenterology;  Laterality: N/A;   LEFT HEART CATHETERIZATION WITH CORONARY ANGIOGRAM Right 03/07/2013   Procedure: LEFT HEART CATHETERIZATION WITH CORONARY ANGIOGRAM;  Surgeon: Salena GORMAN Negri, MD;  Location: [facility] CATH LAB;  Service: Cardiovascular;  Laterality: Right;   UMBILICAL HERNIA REPAIR     UPPER GASTROINTESTINAL ENDOSCOPY     Patient Active Problem List   Diagnosis Date Noted   Intractable diarrhea 08/10/2023   Intractable nausea and vomiting 06/18/2023   History of peptic ulcer 03/07/2023   Lumbosacral radiculopathy 03/07/2023   Duodenitis 03/06/2023   Gastric AVM 03/06/2023   AVM (arteriovenous malformation) of small bowel, 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   Hyperkalemia 03/03/2023   Acute urinary retention 03/01/2023   Intractable pain  02/09/2023   Metabolic acidosis, increased anion gap 02/08/2023   Cocaine abuse (HCC) 02/08/2023   Adynamic ileus (HCC) 01/25/2023   Cannabinoid hyperemesis syndrome 01/25/2023   Epigastric abdominal pain 01/09/2023   Asthma, chronic 01/09/2023   Ileus (HCC) 01/09/2023   Hypokalemia 09/18/2022   NSVT (nonsustained ventricular tachycardia) (HCC) 01/20/2022   Normal anion gap metabolic acidosis 12/08/2021   Hypoalbuminemia 12/08/2021   Acute recurrent pancreatitis 12/07/2021   WPW (Wolff-Parkinson-White syndrome) 12/06/2021   Paroxysmal atrial fibrillation (HCC) 12/06/2021   Pulmonary nodule 12/06/2021   Leukocytosis 06/07/2021   Epigastric pain 06/07/2021   Acute alcoholic pancreatitis 06/06/2021   Urinary retention    Protein-calorie malnutrition, severe 11/17/2020   Acute pancreatitis 11/15/2020   Hypomagnesemia    Hypocalcemia 10/23/2020   AKI (acute kidney injury) (HCC) 11/13/2018   Seizure (HCC) 11/13/2018   Chest pain 01/08/2018   Acute on chronic pancreatitis (HCC) 09/28/2017   Abdominal pain 05/27/2017   Tachycardia 03/18/2017   Nausea, vomiting, and diarrhea 03/18/2017   Diarrhea 03/18/2017   GERD (gastroesophageal reflux disease)    Alcohol  use disorder 12/05/2016   Normocytic anemia 12/05/2016   Alcohol  use disorder, severe, dependence (HCC) 07/25/2016   Cocaine use disorder, severe, dependence (HCC)  07/25/2016   Major depressive disorder, 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   Chronic pancreatitis (HCC) 05/18/2015   Polysubstance abuse (tobacco, cocaine, THC, and ETOH) 03/26/2015   Essential hypertension 02/06/2014   Mood disorder (HCC) 02/06/2014   Pancreatic pseudocyst/cyst 11/25/2013   Kassie Pour White pattern seen on electrocardiogram 10/03/2012   Alcohol  abuse 03/23/2007   Tobacco abuse 03/23/2007    PCP: Celestia Rosaline SQUIBB, NP  REFERRING PROVIDER: Celestia Rosaline SQUIBB, NP  REFERRING DIAG:  K85.90 (ICD-10-CM) -  Pancreatitis R53.81 (ICD-10-CM) - Physical deconditioning  THERAPY DIAG:  Muscle weakness (generalized)  Other abnormalities of gait and mobility  Pain in right lower leg  Rationale for Evaluation and Treatment: Rehabilitation  ONSET DATE: Chronic  SUBJECTIVE:   SUBJECTIVE STATEMENT: Pt presents to PT with reports of continued R distal LE pain. Has been compliant with HEP.   EVAL: Pt presents to PT with reports of increased deconditioning related to walking and R anterior LE pain that was exacerbated by broken toe two months ago. He can only walking about 5-10 minutes now when he used to walk roughly 4 miles or 1 hour at a time. Pain has been limiting him but he also feels his LE are much weaker over the last few months/years. Burning and tingling sensation in R LE, PT recommended neurology consult especially with hx of seizure activity since he does not have one on care team. Also recommended DPM visit for R toes and that pt reach out to PCP regarding this.   PERTINENT HISTORY: WPW, HTN, Drug/ETOH abuse, Depression, Bipolar Disorder  PAIN:  Are you having pain?  Yes: NPRS scale: 6/10 Worst: 10/10 Pain location: R anterior LE Pain description: burning, sharp Aggravating factors: walking, standing Relieving factors: none  PRECAUTIONS: None  RED FLAGS: None   WEIGHT BEARING RESTRICTIONS: No  FALLS:  Has patient fallen in last 6 months? No  LIVING ENVIRONMENT: Lives with: lives with their family Lives in: House/apartment Stairs: 9 stairs in and out of house - handrails each side Has following equipment at home: None  OCCUPATION: Not currently working  PLOF: Independent  PATIENT GOALS: pt would like to improve his comfort with walking and get back to being able to do this for 4 miles   NEXT MD VISIT: PRN  OBJECTIVE:  Note: Objective measures were completed at Evaluation unless otherwise noted.  DIAGNOSTIC FINDINGS: N/A  PATIENT SURVEYS:  LEFS:  35/80  COGNITION: Overall cognitive status: Within functional limits for tasks assessed     SENSATION: Light touch: Impaired - R anterior LE  POSTURE: rounded shoulders, forward head, and flexed trunk   PALPATION: No overt TTP noted  LOWER EXTREMITY MMT:  MMT Right eval Left eval  Hip flexion 3+ 3+  Hip extension    Hip abduction 3+ 3+  Hip adduction 4 4  Hip internal rotation    Hip external rotation    Knee flexion 3+ 4  Knee extension 4 4  Ankle dorsiflexion 2+ 4  Ankle plantarflexion    Ankle inversion    Ankle eversion     (Blank rows = not tested)  LOWER EXTREMITY SPECIAL TESTS:  DNT  FUNCTIONAL TESTS:  30 Second Sit to Stand: 8 reps no UE  GAIT: Distance walked: 75ft Assistive device utilized: None Level of assistance: Complete Independence Comments: flexed trunk   TREATMENT: [facility] Adult PT Treatment:  DATE: 10/04/2023 NuStep lvl 5 UE/LE x 3 min for functional activity tolerance STS 2x10 no UE Supine SLR 2x10 each 2# S/L hip abd x 10 each 2# S/L clamshell 2x15 RTB Bridge x 10 STS x 5  [facility] Adult PT Treatment:                                                DATE: 09/20/2023 Therapeutic Exercise: Supine QS x 5 - 5 hold Supine SLR x 5 each S/L hip abd x 5 each Bridge x 10 STS x 5  PATIENT EDUCATION:  Education details: continue HEP Person educated: Patient Education method: Explanation, Facilities Manager, and Handouts Education comprehension: verbalized understanding and returned demonstration  HOME EXERCISE PROGRAM: Access Code: [email or web address] URL: [email or web address] Date: 09/20/2023 Prepared by: Alm Kingdom  Exercises - Supine Quadricep Sets  - 1 x daily - 7 x weekly - 2 sets - 10 reps - 5 sec hold - Active Straight Leg Raise with Quad Set  - 1 x daily - 7 x weekly - 3 sets - 10 reps - Sidelying Hip Abduction  - 1 x daily - 7 x weekly - 3 sets - 10 reps - Supine Bridge  - 1 x daily  - 7 x weekly - 3 sets - 10 reps - Sit to Stand Without Arm Support  - 1 x daily - 7 x weekly - 3 sets - 10 reps  ASSESSMENT:  CLINICAL IMPRESSION: Pt was able to complete all prescribed exercises with no adverse effect. Today therapy focused on improving functional mobility/activity tolerance and improve proximal hip strength. HEP updated for lateral hip strengthening. Will continue to progress exercises as able per POC.   EVAL: Patient is a 53 y.o. M who was seen today for physical therapy evaluation and treatment for chronic weakness/deconditioning and R LE pain. Physical findings are consistent with referring provider impression as pt demonstrates LE weakness and decrease in functional mobility. LEFS score shows severe disability in performance of home ADLs and community activities. Pt would benefit from skilled PT working on improving strength and slowly ramping activity tolerance.    OBJECTIVE IMPAIRMENTS: Abnormal gait, decreased activity tolerance, decreased balance, decreased mobility, difficulty walking, decreased ROM, decreased strength, and pain   ACTIVITY LIMITATIONS: carrying, lifting, sitting, standing, squatting, stairs, transfers, and locomotion level  PARTICIPATION LIMITATIONS: meal prep, cleaning, driving, shopping, community activity, and yard work  PERSONAL FACTORS: Time since onset of injury/illness/exacerbation and 3+ comorbidities: WPW, HTN, Drug/ETOH abuse, Depression, Bipolar Disorder are also affecting patient's functional outcome.   REHAB POTENTIAL: Good  CLINICAL DECISION MAKING: Evolving/moderate complexity  EVALUATION COMPLEXITY: Moderate   GOALS: Goals reviewed with patient? No  SHORT TERM GOALS: Target date: 10/12/2023   Pt will be compliant and knowledgeable with initial HEP for improved comfort and carryover Baseline: initial HEP given  Goal status: INITIAL  2.  Pt will self report R LE pain no greater than 7/10 for improved comfort and functional  ability Baseline: 10/10 at worst Goal status: INITIAL   LONG TERM GOALS: Target date: 11/16/2023   Pt will improve LEFS to no less than 48/80 as proxy for functional improvement with home ADLs and higher level community activity Baseline: 35/80 Goal status: INITIAL   2.  Pt will self report R LE pain no greater than 4/10 for improved comfort and functional ability Baseline: 10/10 at worst  Goal status: INITIAL   3.  Pt will increase 30 Second Sit to Stand rep count to no less than 10 reps for improved balance, strength, and functional mobility Baseline: 8 reps  Goal status: INITIAL   4.  Pt will improve all LE MMT to at least 4/5 for improved functional mobility and conditioning  Baseline: see MMT chart Goal status: INITIAL  5.  Pt will be able to ambulate for >1 hours for improved functional mobility in community and getting back to PLOF  Baseline: 10 minutes Goal status: INITIAL   PLAN:  PT FREQUENCY: 1x/week  PT DURATION: 8 weeks  PLANNED INTERVENTIONS: 97164- PT Re-evaluation, 97110-Therapeutic exercises, 97530- Therapeutic activity, [id number]- Neuromuscular re-education, 97535- Self Care, [id number]- Manual therapy, [id number]- Gait training, [id number]- Electrical stimulation (unattended), [id number]- Electrical stimulation (manual), 97016- Vasopneumatic device, Dry Needling, Cryotherapy, and Moist heat  PLAN FOR NEXT SESSION: assess HEP response, global strengthening and conditioning   For all possible CPT codes, reference the Planned Interventions line above.     Check all conditions that are expected to impact treatment: {Conditions expected to impact treatment:Musculoskeletal disorders   If treatment provided at initial evaluation, no treatment charged due to lack of authorization.       Alm JAYSON Kingdom, PT 10/04/2023, 2:55 PM

## 2023-10-10 ENCOUNTER — Telehealth: Payer: Self-pay

## 2023-10-10 ENCOUNTER — Ambulatory Visit: Payer: MEDICAID | Attending: Primary Care

## 2023-10-10 NOTE — Therapy (Incomplete)
 OUTPATIENT PHYSICAL THERAPY TREATMENT   Patient Name: [name] MRN: [id number] DOB:May 16, 1970, 52 y.o., male Today's Date: 10/10/2023  END OF SESSION:     Past Medical History:  Diagnosis Date   Alcoholism /alcohol  abuse    Anemia    Anxiety    Arthritis    knees; arms; elbows (03/26/2015)   Asthma    Bipolar disorder (HCC)    Chronic bronchitis (HCC)    Chronic lower back pain    Chronic pancreatitis (HCC)    Cocaine abuse (HCC)    Depression    Family history of adverse reaction to anesthesia    Femoral condyle fracture (HCC) 03/08/2014   left medial/notes 03/09/2014   GERD (gastroesophageal reflux disease)    H/O hiatal hernia    H/O suicide attempt 10/2012   High cholesterol    History of blood transfusion 10/2012   when I tried to commit suicide   History of stomach ulcers    Hypertension    Marijuana abuse, continuous    Migraine    a few times/year (03/26/2015)   Pancreatitis    Pneumonia 1990's X 3   PTSD (post-traumatic stress disorder)    Seizures (HCC)    Sickle cell trait (HCC)    WPW (Wolff-Parkinson-White syndrome)    Maximo Spar 03/06/2013   Past Surgical History:  Procedure Laterality Date   BIOPSY  11/25/2017   Procedure: BIOPSY;  Surgeon: Evangeline Hilts, MD;  Location: [facility] ENDOSCOPY;  Service: Endoscopy;;   BIOPSY  10/14/2018   Procedure: BIOPSY;  Surgeon: Evangeline Hilts, MD;  Location: [facility] ENDOSCOPY;  Service: Endoscopy;;   BIOPSY  03/06/2023   Procedure: BIOPSY;  Surgeon: Normie Becton., MD;  Location: [facility] ENDOSCOPY;  Service: Gastroenterology;;   CARDIAC CATHETERIZATION     CYST ENTEROSTOMY  01/02/2020   Procedure: CYST ASPIRATION;  Surgeon: Janel Medford, MD;  Location: [facility] ENDOSCOPY;  Service: Endoscopy;;   ESOPHAGOGASTRODUODENOSCOPY N/A 03/06/2023   Procedure: ESOPHAGOGASTRODUODENOSCOPY (EGD);  Surgeon: Normie Becton., MD;  Location: [facility] ENDOSCOPY;  Service: Gastroenterology;  Laterality: N/A;    ESOPHAGOGASTRODUODENOSCOPY (EGD) WITH PROPOFOL  N/A 11/25/2017   Procedure: ESOPHAGOGASTRODUODENOSCOPY (EGD) WITH PROPOFOL ;  Surgeon: Evangeline Hilts, MD;  Location: [facility] ENDOSCOPY;  Service: Endoscopy;  Laterality: N/A;   ESOPHAGOGASTRODUODENOSCOPY (EGD) WITH PROPOFOL  Left 10/14/2018   Procedure: ESOPHAGOGASTRODUODENOSCOPY (EGD) WITH PROPOFOL ;  Surgeon: Evangeline Hilts, MD;  Location: [facility] ENDOSCOPY;  Service: Endoscopy;  Laterality: Left;   ESOPHAGOGASTRODUODENOSCOPY (EGD) WITH PROPOFOL  N/A 11/14/2018   Procedure: ESOPHAGOGASTRODUODENOSCOPY (EGD) WITH PROPOFOL ;  Surgeon: Jolinda Necessary, MD;  Location: [facility] ENDOSCOPY;  Service: Gastroenterology;  Laterality: N/A;   ESOPHAGOGASTRODUODENOSCOPY (EGD) WITH PROPOFOL  N/A 01/02/2020   Procedure: ESOPHAGOGASTRODUODENOSCOPY (EGD) WITH PROPOFOL ;  Surgeon: Janel Medford, MD;  Location: [facility] ENDOSCOPY;  Service: Endoscopy;  Laterality: N/A;   ESOPHAGOGASTRODUODENOSCOPY (EGD) WITH PROPOFOL  N/A 10/25/2020   Procedure: ESOPHAGOGASTRODUODENOSCOPY (EGD) WITH PROPOFOL ;  Surgeon: Brice Campi Albino Alu., MD;  Location: [facility] ENDOSCOPY;  Service: Gastroenterology;  Laterality: N/A;   EUS N/A 01/02/2020   Procedure: UPPER ENDOSCOPIC ULTRASOUND (EUS) RADIAL;  Surgeon: Janel Medford, MD;  Location: [facility] ENDOSCOPY;  Service: Endoscopy;  Laterality: N/A;   EUS N/A 03/06/2023   Procedure: UPPER ENDOSCOPIC ULTRASOUND (EUS) RADIAL;  Surgeon: Normie Becton., MD;  Location: [facility] ENDOSCOPY;  Service: Gastroenterology;  Laterality: N/A;   EYE SURGERY Left 1990's   "result of trauma"    FACIAL FRACTURE SURGERY Left 1990's   "result of trauma"    FINE NEEDLE ASPIRATION N/A 03/06/2023   Procedure: FINE  NEEDLE ASPIRATION (FNA) LINEAR;  Surgeon: Brice Campi Albino Alu., MD;  Location: [facility] ENDOSCOPY;  Service: Gastroenterology;  Laterality: N/A;   FLEXIBLE SIGMOIDOSCOPY N/A 10/25/2020   Procedure: FLEXIBLE SIGMOIDOSCOPY;  Surgeon: Brice Campi Albino Alu., MD;  Location: [facility] ENDOSCOPY;   Service: Gastroenterology;  Laterality: N/A;   FRACTURE SURGERY     HEMOSTASIS CLIP PLACEMENT  10/25/2020   Procedure: HEMOSTASIS CLIP PLACEMENT;  Surgeon: Normie Becton., MD;  Location: [facility] ENDOSCOPY;  Service: Gastroenterology;;   HERNIA REPAIR     HOT HEMOSTASIS N/A 03/06/2023   Procedure: HOT HEMOSTASIS (ARGON PLASMA COAGULATION/BICAP);  Surgeon: Normie Becton., MD;  Location: [facility] ENDOSCOPY;  Service: Gastroenterology;  Laterality: N/A;   LEFT HEART CATHETERIZATION WITH CORONARY ANGIOGRAM Right 03/07/2013   Procedure: LEFT HEART CATHETERIZATION WITH CORONARY ANGIOGRAM;  Surgeon: Darrold Emms, MD;  Location: [facility] CATH LAB;  Service: Cardiovascular;  Laterality: Right;   UMBILICAL HERNIA REPAIR     UPPER GASTROINTESTINAL ENDOSCOPY     Patient Active Problem List   Diagnosis Date Noted   Intractable diarrhea 08/10/2023   Intractable nausea and vomiting 06/18/2023   History of peptic ulcer 03/07/2023   Lumbosacral radiculopathy 03/07/2023   Duodenitis 03/06/2023   Gastric AVM 03/06/2023   AVM (arteriovenous malformation) of small bowel, 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   Hyperkalemia 03/03/2023   Acute urinary retention 03/01/2023   Intractable pain 02/09/2023   Metabolic acidosis, increased anion gap 02/08/2023   Cocaine abuse (HCC) 02/08/2023   Adynamic ileus (HCC) 01/25/2023   Cannabinoid hyperemesis syndrome 01/25/2023   Epigastric abdominal pain 01/09/2023   Asthma, chronic 01/09/2023   Ileus (HCC) 01/09/2023   Hypokalemia 09/18/2022   NSVT (nonsustained ventricular tachycardia) (HCC) 01/20/2022   Normal anion gap metabolic acidosis 12/08/2021   Hypoalbuminemia 12/08/2021   Acute recurrent pancreatitis 12/07/2021   WPW (Wolff-Parkinson-White syndrome) 12/06/2021   Paroxysmal atrial fibrillation (HCC) 12/06/2021   Pulmonary nodule 12/06/2021   Leukocytosis 06/07/2021   Epigastric pain 06/07/2021   Acute alcoholic pancreatitis 06/06/2021   Urinary retention     Protein-calorie malnutrition, severe 11/17/2020   Acute pancreatitis 11/15/2020   Hypomagnesemia    Hypocalcemia 10/23/2020   AKI (acute kidney injury) (HCC) 11/13/2018   Seizure (HCC) 11/13/2018   Chest pain 01/08/2018   Acute on chronic pancreatitis (HCC) 09/28/2017   Abdominal pain 05/27/2017   Tachycardia 03/18/2017   Nausea, vomiting, and diarrhea 03/18/2017   Diarrhea 03/18/2017   GERD (gastroesophageal reflux disease)    Alcohol  use disorder 12/05/2016   Normocytic anemia 12/05/2016   Alcohol  use disorder, severe, dependence (HCC) 07/25/2016   Cocaine use disorder, severe, dependence (HCC) 07/25/2016   Major depressive disorder, 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   Chronic pancreatitis (HCC) 05/18/2015   Polysubstance abuse (tobacco, cocaine, THC, and ETOH) 03/26/2015   Essential hypertension 02/06/2014   Mood disorder (HCC) 02/06/2014   Pancreatic pseudocyst/cyst 11/25/2013   Zoanne Hinders White pattern seen on electrocardiogram 10/03/2012   Alcohol  abuse 03/23/2007   Tobacco abuse 03/23/2007    PCP: Marius Siemens, NP  REFERRING PROVIDER: Marius Siemens, NP  REFERRING DIAG:  K85.90 (ICD-10-CM) - Pancreatitis R53.81 (ICD-10-CM) - Physical deconditioning  THERAPY DIAG:  No diagnosis found.  Rationale for Evaluation and Treatment: Rehabilitation  ONSET DATE: Chronic  SUBJECTIVE:   SUBJECTIVE STATEMENT: ***  EVAL: Pt presents to PT with reports of increased deconditioning related to walking and R anterior LE pain that was exacerbated by broken toe two months ago. He can only walking about 5-10 minutes now  when he used to walk roughly 4 miles or 1 hour at a time. Pain has been limiting him but he also feels his LE are much weaker over the last few months/years. Burning and tingling sensation in R LE, PT recommended neurology consult especially with hx of seizure activity since he does not have one on care team. Also recommended  DPM visit for R toes and that pt reach out to PCP regarding this.   PERTINENT HISTORY: WPW, HTN, Drug/ETOH abuse, Depression, Bipolar Disorder  PAIN:  Are you having pain?  Yes: NPRS scale: 6/10 Worst: 10/10 Pain location: R anterior LE Pain description: burning, sharp Aggravating factors: walking, standing Relieving factors: none  PRECAUTIONS: None  RED FLAGS: None   WEIGHT BEARING RESTRICTIONS: No  FALLS:  Has patient fallen in last 6 months? No  LIVING ENVIRONMENT: Lives with: lives with their family Lives in: House/apartment Stairs: 9 stairs in and out of house - handrails each side Has following equipment at home: None  OCCUPATION: Not currently working  PLOF: Independent  PATIENT GOALS: pt would like to improve his comfort with walking and get back to being able to do this for 4 miles   NEXT MD VISIT: PRN  OBJECTIVE:  Note: Objective measures were completed at Evaluation unless otherwise noted.  DIAGNOSTIC FINDINGS: N/A  PATIENT SURVEYS:  LEFS: 35/80  COGNITION: Overall cognitive status: Within functional limits for tasks assessed     SENSATION: Light touch: Impaired - R anterior LE  POSTURE: rounded shoulders, forward head, and flexed trunk   PALPATION: No overt TTP noted  LOWER EXTREMITY MMT:  MMT Right eval Left eval  Hip flexion 3+ 3+  Hip extension    Hip abduction 3+ 3+  Hip adduction 4 4  Hip internal rotation    Hip external rotation    Knee flexion 3+ 4  Knee extension 4 4  Ankle dorsiflexion 2+ 4  Ankle plantarflexion    Ankle inversion    Ankle eversion     (Blank rows = not tested)  LOWER EXTREMITY SPECIAL TESTS:  DNT  FUNCTIONAL TESTS:  30 Second Sit to Stand: 8 reps no UE  GAIT: Distance walked: 31ft Assistive device utilized: None Level of assistance: Complete Independence Comments: flexed trunk   TREATMENT: [facility] Adult PT Treatment:                                                DATE: 10/10/2023 NuStep lvl 5  UE/LE x 3 min for functional activity tolerance STS 2x10 no UE Supine SLR 2x10 each 2# S/L hip abd x 10 each 2# S/L clamshell 2x15 RTB Bridge x 10 STS x 5  [facility] Adult PT Treatment:                                                DATE: 10/04/2023 NuStep lvl 5 UE/LE x 3 min for functional activity tolerance STS 2x10 no UE Supine SLR 2x10 each 2# S/L hip abd x 10 each 2# S/L clamshell 2x15 RTB Bridge x 10 STS x 5  [facility] Adult PT Treatment:  DATE: 09/20/2023 Therapeutic Exercise: Supine QS x 5 - 5" hold Supine SLR x 5 each S/L hip abd x 5 each Bridge x 10 STS x 5  PATIENT EDUCATION:  Education details: continue HEP Person educated: Patient Education method: Explanation, Demonstration, and Handouts Education comprehension: verbalized understanding and returned demonstration  HOME EXERCISE PROGRAM: Access Code: [email or web address] URL: [email or web address] Date: 09/20/2023 Prepared by: Loral Roch  Exercises - Supine Quadricep Sets  - 1 x daily - 7 x weekly - 2 sets - 10 reps - 5 sec hold - Active Straight Leg Raise with Quad Set  - 1 x daily - 7 x weekly - 3 sets - 10 reps - Sidelying Hip Abduction  - 1 x daily - 7 x weekly - 3 sets - 10 reps - Supine Bridge  - 1 x daily - 7 x weekly - 3 sets - 10 reps - Sit to Stand Without Arm Support  - 1 x daily - 7 x weekly - 3 sets - 10 reps  ASSESSMENT:  CLINICAL IMPRESSION: ***  EVAL: Patient is a 53 y.o. M who was seen today for physical therapy evaluation and treatment for chronic weakness/deconditioning and R LE pain. Physical findings are consistent with referring provider impression as pt demonstrates LE weakness and decrease in functional mobility. LEFS score shows severe disability in performance of home ADLs and community activities. Pt would benefit from skilled PT working on improving strength and slowly ramping activity tolerance.    OBJECTIVE IMPAIRMENTS: Abnormal gait,  decreased activity tolerance, decreased balance, decreased mobility, difficulty walking, decreased ROM, decreased strength, and pain   ACTIVITY LIMITATIONS: carrying, lifting, sitting, standing, squatting, stairs, transfers, and locomotion level  PARTICIPATION LIMITATIONS: meal prep, cleaning, driving, shopping, community activity, and yard work  PERSONAL FACTORS: Time since onset of injury/illness/exacerbation and 3+ comorbidities: WPW, HTN, Drug/ETOH abuse, Depression, Bipolar Disorder are also affecting patient's functional outcome.   REHAB POTENTIAL: Good  CLINICAL DECISION MAKING: Evolving/moderate complexity  EVALUATION COMPLEXITY: Moderate   GOALS: Goals reviewed with patient? No  SHORT TERM GOALS: Target date: 10/12/2023   Pt will be compliant and knowledgeable with initial HEP for improved comfort and carryover Baseline: initial HEP given  Goal status: INITIAL  2.  Pt will self report R LE pain no greater than 7/10 for improved comfort and functional ability Baseline: 10/10 at worst Goal status: INITIAL   LONG TERM GOALS: Target date: 11/16/2023   Pt will improve LEFS to no less than 48/80 as proxy for functional improvement with home ADLs and higher level community activity Baseline: 35/80 Goal status: INITIAL   2.  Pt will self report R LE pain no greater than 4/10 for improved comfort and functional ability Baseline: 10/10 at worst Goal status: INITIAL   3.  Pt will increase 30 Second Sit to Stand rep count to no less than 10 reps for improved balance, strength, and functional mobility Baseline: 8 reps  Goal status: INITIAL   4.  Pt will improve all LE MMT to at least 4/5 for improved functional mobility and conditioning  Baseline: see MMT chart Goal status: INITIAL  5.  Pt will be able to ambulate for >1 hours for improved functional mobility in community and getting back to PLOF  Baseline: 10 minutes Goal status: INITIAL   PLAN:  PT FREQUENCY:  1x/week  PT DURATION: 8 weeks  PLANNED INTERVENTIONS: [id number]- PT Re-evaluation, 97110-Therapeutic exercises, 97530- Therapeutic activity, [id number]- Neuromuscular re-education, 97535- Self Care, [id number]- Manual therapy, [id number]- Gait training, [id number]-  Electrical stimulation (unattended), [id number]- Electrical stimulation (manual), 16109- Vasopneumatic device, Dry Needling, Cryotherapy, and Moist heat  PLAN FOR NEXT SESSION: assess HEP response, global strengthening and conditioning   For all possible CPT codes, reference the Planned Interventions line above.     Check all conditions that are expected to impact treatment: {Conditions expected to impact treatment:Musculoskeletal disorders   If treatment provided at initial evaluation, no treatment charged due to lack of authorization.       Ivor Mars, PT 10/10/2023, 7:58 AM

## 2023-10-10 NOTE — Telephone Encounter (Signed)
 PT called and spoke with patient regarding missed visit, he thought his appointment was for tomorrow. Reminded him of next appointment.  Ivor Mars   10/10/23 2:26 PM

## 2023-10-11 ENCOUNTER — Encounter (HOSPITAL_COMMUNITY): Payer: Self-pay

## 2023-10-11 ENCOUNTER — Emergency Department (HOSPITAL_COMMUNITY)
Admission: EM | Admit: 2023-10-11 | Discharge: 2023-10-11 | Disposition: A | Discharge status: Home / Self Care | Payer: MEDICAID | Attending: Emergency Medicine | Admitting: Emergency Medicine

## 2023-10-11 ENCOUNTER — Other Ambulatory Visit: Payer: Self-pay

## 2023-10-11 ENCOUNTER — Emergency Department (HOSPITAL_COMMUNITY): Payer: MEDICAID

## 2023-10-11 DIAGNOSIS — K861 Other chronic pancreatitis: Secondary | ICD-10-CM | POA: Insufficient documentation

## 2023-10-11 DIAGNOSIS — Z91040 Latex allergy status: Secondary | ICD-10-CM | POA: Diagnosis not present

## 2023-10-11 DIAGNOSIS — Z79899 Other long term (current) drug therapy: Secondary | ICD-10-CM | POA: Diagnosis not present

## 2023-10-11 DIAGNOSIS — F17290 Nicotine dependence, other tobacco product, uncomplicated: Secondary | ICD-10-CM | POA: Insufficient documentation

## 2023-10-11 DIAGNOSIS — Z7951 Long term (current) use of inhaled steroids: Secondary | ICD-10-CM | POA: Diagnosis not present

## 2023-10-11 DIAGNOSIS — I10 Essential (primary) hypertension: Secondary | ICD-10-CM | POA: Insufficient documentation

## 2023-10-11 DIAGNOSIS — R1013 Epigastric pain: Secondary | ICD-10-CM | POA: Diagnosis present

## 2023-10-11 DIAGNOSIS — J45909 Unspecified asthma, uncomplicated: Secondary | ICD-10-CM | POA: Insufficient documentation

## 2023-10-11 LAB — COMPREHENSIVE METABOLIC PANEL WITH GFR
ALT: 37 U/L (ref 0–44)
AST: 83 U/L — ABNORMAL HIGH (ref 15–41)
Albumin: 3.4 g/dL — ABNORMAL LOW (ref 3.5–5.0)
Alkaline Phosphatase: 61 U/L (ref 38–126)
Anion gap: 12 (ref 5–15)
BUN: 7 mg/dL (ref 6–20)
CO2: 15 mmol/L — ABNORMAL LOW (ref 22–32)
Calcium: 8 mg/dL — ABNORMAL LOW (ref 8.9–10.3)
Chloride: 115 mmol/L — ABNORMAL HIGH (ref 98–111)
Creatinine, Ser: 0.81 mg/dL (ref 0.61–1.24)
GFR, Estimated: >60 mL/min (ref >60)
Glucose, Bld: 75 mg/dL (ref 70–99)
Potassium: 4.4 mmol/L (ref 3.5–5.1)
Sodium: 142 mmol/L (ref 135–145)
Total Bilirubin: 0.6 mg/dL (ref 0.0–1.2)
Total Protein: 6.4 g/dL — ABNORMAL LOW (ref 6.5–8.1)

## 2023-10-11 LAB — CBC WITH DIFFERENTIAL/PLATELET
Abs Immature Granulocytes: 0.04 10*3/uL (ref 0.00–0.07)
Basophils Absolute: 0 10*3/uL (ref 0.0–0.1)
Basophils Relative: 0 %
Eosinophils Absolute: 0.2 10*3/uL (ref 0.0–0.5)
Eosinophils Relative: 3 %
HCT: 32.9 % — ABNORMAL LOW (ref 39.0–52.0)
Hemoglobin: 10.7 g/dL — ABNORMAL LOW (ref 13.0–17.0)
Immature Granulocytes: 1 %
Lymphocytes Relative: 46 %
Lymphs Abs: 2.9 10*3/uL (ref 0.7–4.0)
MCH: 29.2 pg (ref 26.0–34.0)
MCHC: 32.5 g/dL (ref 30.0–36.0)
MCV: 89.9 fL (ref 80.0–100.0)
Monocytes Absolute: 0.7 10*3/uL (ref 0.1–1.0)
Monocytes Relative: 12 %
Neutro Abs: 2.3 10*3/uL (ref 1.7–7.7)
Neutrophils Relative %: 38 %
Platelets: 175 10*3/uL (ref 150–400)
RBC: 3.66 MIL/uL — ABNORMAL LOW (ref 4.22–5.81)
RDW: 20.5 % — ABNORMAL HIGH (ref 11.5–15.5)
WBC: 6.2 10*3/uL (ref 4.0–10.5)
nRBC: 0 % (ref 0.0–0.2)

## 2023-10-11 LAB — URINALYSIS, ROUTINE W REFLEX MICROSCOPIC
Bilirubin Urine: NEGATIVE
Glucose, UA: NEGATIVE mg/dL
Hgb urine dipstick: NEGATIVE
Ketones, ur: NEGATIVE mg/dL
Leukocytes,Ua: NEGATIVE
Nitrite: NEGATIVE
Protein, ur: NEGATIVE mg/dL
Specific Gravity, Urine: 1.015 (ref 1.005–1.030)
pH: 6 (ref 5.0–8.0)

## 2023-10-11 LAB — LIPASE, BLOOD: Lipase: 24 U/L (ref 11–51)

## 2023-10-11 LAB — TROPONIN I (HIGH SENSITIVITY): Troponin I (High Sensitivity): 6 ng/L (ref <18)

## 2023-10-11 MED ORDER — HYDROMORPHONE HCL 1 MG/ML IJ SOLN
0.5000 mg | Freq: Once | INTRAMUSCULAR | Status: AC
  Administered 2023-10-11: 0.5 mg via INTRAVENOUS
  Filled 2023-10-11: qty 1

## 2023-10-11 MED ORDER — OXYCODONE-ACETAMINOPHEN 5-325 MG PO TABS
1.0000 | ORAL_TABLET | Freq: Once | ORAL | Status: AC
  Administered 2023-10-11: 1 via ORAL
  Filled 2023-10-11: qty 1

## 2023-10-11 MED ORDER — ONDANSETRON HCL 4 MG/2ML IJ SOLN
4.0000 mg | Freq: Once | INTRAMUSCULAR | Status: AC
  Administered 2023-10-11: 4 mg via INTRAVENOUS
  Filled 2023-10-11: qty 2

## 2023-10-11 MED ORDER — ALUM & MAG HYDROXIDE-SIMETH 200-200-20 MG/5ML PO SUSP
30.0000 mL | Freq: Once | ORAL | Status: AC
  Administered 2023-10-11: 30 mL via ORAL
  Filled 2023-10-11: qty 30

## 2023-10-11 MED ORDER — SODIUM CHLORIDE 0.9 % IV BOLUS
1000.0000 mL | Freq: Once | INTRAVENOUS | Status: AC
  Administered 2023-10-11: 1000 mL via INTRAVENOUS

## 2023-10-11 MED ORDER — OXYCODONE HCL 5 MG PO TABS: 5.0000 mg | ORAL_TABLET | Freq: Four times a day (QID) | ORAL | 0 refills | Status: DC | PRN

## 2023-10-11 MED ORDER — LIDOCAINE VISCOUS HCL 2 % MT SOLN
15.0000 mL | Freq: Once | OROMUCOSAL | Status: AC
  Administered 2023-10-11: 15 mL via ORAL
  Filled 2023-10-11: qty 15

## 2023-10-11 MED ORDER — LIDOCAINE VISCOUS HCL 2 % MT SOLN: 15.0000 mL | Freq: Two times a day (BID) | OROMUCOSAL | 0 refills | Status: DC | PRN

## 2023-10-11 NOTE — ED Provider Notes (Signed)
 [place] [facility] DEPARTMENT AT [facility] Provider Note  CSN: [id number] Arrival date & time: 10/11/23 2005  Chief Complaint(s) Abdominal Pain  HPI [name] is a 53 y.o. male history of chronic pancreatitis, GERD, presenting to the emergency department with epigastric pain.  Patient reports epigastric pain which is similar to previous episodes of epigastric pain.  Reports that he has had some nausea, no vomiting.  No fevers or chills.  No bloody stools or melena.  No back pain.  No chest pain.  No loss of consciousness.  Report when the pain was bad he felt mildly short of breath.  Reports that he is almost out of oxycodone .   Past Medical History Past Medical History:  Diagnosis Date   Alcoholism /alcohol  abuse    Anemia    Anxiety    Arthritis    knees; arms; elbows (03/26/2015)   Asthma    Bipolar disorder (HCC)    Chronic bronchitis (HCC)    Chronic lower back pain    Chronic pancreatitis (HCC)    Cocaine abuse (HCC)    Depression    Family history of adverse reaction to anesthesia    Femoral condyle fracture (HCC) 03/08/2014   left medial/notes 03/09/2014   GERD (gastroesophageal reflux disease)    H/O hiatal hernia    H/O suicide attempt 10/2012   High cholesterol    History of blood transfusion 10/2012   when I tried to commit suicide   History of stomach ulcers    Hypertension    Marijuana abuse, continuous    Migraine    a few times/year (03/26/2015)   Pancreatitis    Pneumonia 1990's X 3   PTSD (post-traumatic stress disorder)    Seizures (HCC)    Sickle cell trait (HCC)    WPW (Wolff-Parkinson-White syndrome)    Maximo Spar 03/06/2013   Patient Active Problem List   Diagnosis Date Noted   Intractable diarrhea 08/10/2023   Intractable nausea and vomiting 06/18/2023   History of peptic ulcer 03/07/2023   Lumbosacral radiculopathy 03/07/2023   Duodenitis 03/06/2023   Gastric AVM 03/06/2023   AVM (arteriovenous malformation) of  small bowel, 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   Hyperkalemia 03/03/2023   Acute urinary retention 03/01/2023   Intractable pain 02/09/2023   Metabolic acidosis, increased anion gap 02/08/2023   Cocaine abuse (HCC) 02/08/2023   Adynamic ileus (HCC) 01/25/2023   Cannabinoid hyperemesis syndrome 01/25/2023   Epigastric abdominal pain 01/09/2023   Asthma, chronic 01/09/2023   Ileus (HCC) 01/09/2023   Hypokalemia 09/18/2022   NSVT (nonsustained ventricular tachycardia) (HCC) 01/20/2022   Normal anion gap metabolic acidosis 12/08/2021   Hypoalbuminemia 12/08/2021   Acute recurrent pancreatitis 12/07/2021   WPW (Wolff-Parkinson-White syndrome) 12/06/2021   Paroxysmal atrial fibrillation (HCC) 12/06/2021   Pulmonary nodule 12/06/2021   Leukocytosis 06/07/2021   Epigastric pain 06/07/2021   Acute alcoholic pancreatitis 06/06/2021   Urinary retention    Protein-calorie malnutrition, severe 11/17/2020   Acute pancreatitis 11/15/2020   Hypomagnesemia    Hypocalcemia 10/23/2020   AKI (acute kidney injury) (HCC) 11/13/2018   Seizure (HCC) 11/13/2018   Chest pain 01/08/2018   Acute on chronic pancreatitis (HCC) 09/28/2017   Abdominal pain 05/27/2017   Tachycardia 03/18/2017   Nausea, vomiting, and diarrhea 03/18/2017   Diarrhea 03/18/2017   GERD (gastroesophageal reflux disease)    Alcohol  use disorder 12/05/2016   Normocytic anemia 12/05/2016   Alcohol  use disorder, severe, dependence (HCC) 07/25/2016   Cocaine use disorder, severe, dependence (HCC) 07/25/2016  Major depressive disorder, 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 Authorizing Provider  acetaminophen  (TYLENOL ) 500 MG tablet Take 1,000 mg by mouth daily as needed for mild pain (pain score 1-3) or moderate pain (pain score 4-6).    [provider]  calcium -vitamin D  (OSCAL WITH D) 500-5 MG-MCG tablet Take 1 tablet by mouth daily with breakfast. 09/18/23   Ninetta Basket, MD  dicyclomine  (BENTYL ) 20 MG tablet Take 1 tablet (20 mg total) by mouth daily as needed for spasms. Patient not taking: Reported on 08/26/2023 08/21/23   Burton Casey, MD  famotidine  (PEPCID ) 40 MG tablet Take 1 tablet (40 mg total) by mouth daily. 09/19/23   Ninetta Basket, MD  fluticasone  (FLONASE ) 50 MCG/ACT nasal spray Place 2 sprays into both nostrils daily as needed for allergies.    [provider]  folic acid  (FOLVITE ) 1 MG tablet Take 1 tablet (1 mg total) by mouth daily. Patient not taking: Reported on 09/18/2023 08/21/23   Burton Casey, MD  lidocaine  (XYLOCAINE ) 2 % solution Use as directed 15 mLs in the mouth or throat every 12 (twelve) hours as needed. 10/11/23   Mordecai Applebaum, MD  lipase/protease/amylase (CREON ) 36000 UNITS CPEP capsule Take 2 capsules (72,000 Units total) by mouth 3 (three) times daily with meals. May also take 1 capsule (36,000 Units total) as needed (with snacks). 08/21/23   Ghimire, Estil Heman, MD  loperamide  (IMODIUM ) 2 MG capsule Take 2 capsules (4 mg total) by mouth as needed for diarrhea or loose stools. 09/25/23   Mesner, Reymundo Caulk, MD  magnesium  oxide (MAG-OX) 400 (240 Mg) MG tablet Take 1 tablet (400 mg total) by mouth daily. Patient taking differently: Take 400 mg by mouth 2 (two) times daily. 08/21/23   Ghimire, Estil Heman, MD  metoprolol  (TOPROL -XL) 200 MG 24 hr tablet Take 1 tablet (200 mg total) by mouth daily. 08/21/23   Ghimire, Estil Heman, MD  Multiple Vitamin (MULTIVITAMIN WITH MINERALS) TABS tablet Take 1 tablet by mouth daily.  08/21/23   Ghimire, Estil Heman, MD  ondansetron  (ZOFRAN ) 4 MG tablet Take 1 tablet (4 mg total) by mouth every 6 (six) hours. 09/18/23   Ninetta Basket, MD  ondansetron  (ZOFRAN -ODT) 4 MG disintegrating tablet Take 1 tablet (4 mg total) by mouth daily as needed for nausea or vomiting. 08/21/23   Ghimire, Estil Heman, MD  oxyCODONE  (ROXICODONE ) 5 MG immediate release tablet Take 1 tablet (5 mg total) by mouth every 6 (six) hours as needed. 10/11/23   Mordecai Applebaum, MD  pantoprazole  (PROTONIX ) 20 MG tablet Take 1 tablet (20 mg total) by mouth daily. 10/03/23   Sherel Dikes, PA-C  psyllium (HYDROCIL/METAMUCIL) 95 % PACK Take 1 packet by mouth daily. Patient not taking: Reported on 09/18/2023 08/21/23   Burton Casey, MD  sodium zirconium cyclosilicate  (LOKELMA ) 10 g PACK packet Take 10 g by mouth 2 (two) times daily. Patient not taking: Reported on 08/26/2023 08/21/23   Burton Casey, MD  sucralfate  (CARAFATE ) 1 g tablet Take 1 tablet (1 g total) by mouth in the  morning, at noon, and at bedtime. 09/02/23   Marius Siemens, NP  tamsulosin  (FLOMAX ) 0.4 MG CAPS capsule Take 1 capsule (0.4 mg total) by mouth daily. 08/21/23   Ghimire, Estil Heman, MD  thiamine  (VITAMIN B1) 100 MG tablet Take 1 tablet (100 mg total) by mouth daily. 08/21/23   Ghimire, Estil Heman, MD  amitriptyline  (ELAVIL ) 25 MG tablet Take 1 tablet (25 mg total) by mouth at bedtime. Patient not taking: Reported on 08/08/2019 10/15/18 08/08/19  Kraig Peru, MD                                                                                                                                    Past Surgical History Past Surgical History:  Procedure Laterality Date   BIOPSY  11/25/2017   Procedure: BIOPSY;  Surgeon: Evangeline Hilts, MD;  Location: [facility] ENDOSCOPY;  Service: Endoscopy;;   BIOPSY  10/14/2018   Procedure: BIOPSY;  Surgeon: Evangeline Hilts, MD;  Location: [facility] ENDOSCOPY;  Service: Endoscopy;;   BIOPSY  03/06/2023   Procedure:  BIOPSY;  Surgeon: Normie Becton., MD;  Location: [facility] ENDOSCOPY;  Service: Gastroenterology;;   CARDIAC CATHETERIZATION     CYST ENTEROSTOMY  01/02/2020   Procedure: CYST ASPIRATION;  Surgeon: Janel Medford, MD;  Location: [facility] ENDOSCOPY;  Service: Endoscopy;;   ESOPHAGOGASTRODUODENOSCOPY N/A 03/06/2023   Procedure: ESOPHAGOGASTRODUODENOSCOPY (EGD);  Surgeon: Normie Becton., MD;  Location: [facility] ENDOSCOPY;  Service: Gastroenterology;  Laterality: N/A;   ESOPHAGOGASTRODUODENOSCOPY (EGD) WITH PROPOFOL  N/A 11/25/2017   Procedure: ESOPHAGOGASTRODUODENOSCOPY (EGD) WITH PROPOFOL ;  Surgeon: Evangeline Hilts, MD;  Location: [facility] ENDOSCOPY;  Service: Endoscopy;  Laterality: N/A;   ESOPHAGOGASTRODUODENOSCOPY (EGD) WITH PROPOFOL  Left 10/14/2018   Procedure: ESOPHAGOGASTRODUODENOSCOPY (EGD) WITH PROPOFOL ;  Surgeon: Evangeline Hilts, MD;  Location: [facility] ENDOSCOPY;  Service: Endoscopy;  Laterality: Left;   ESOPHAGOGASTRODUODENOSCOPY (EGD) WITH PROPOFOL  N/A 11/14/2018   Procedure: ESOPHAGOGASTRODUODENOSCOPY (EGD) WITH PROPOFOL ;  Surgeon: Jolinda Necessary, MD;  Location: [facility] ENDOSCOPY;  Service: Gastroenterology;  Laterality: N/A;   ESOPHAGOGASTRODUODENOSCOPY (EGD) WITH PROPOFOL  N/A 01/02/2020   Procedure: ESOPHAGOGASTRODUODENOSCOPY (EGD) WITH PROPOFOL ;  Surgeon: Janel Medford, MD;  Location: [facility] ENDOSCOPY;  Service: Endoscopy;  Laterality: N/A;   ESOPHAGOGASTRODUODENOSCOPY (EGD) WITH PROPOFOL  N/A 10/25/2020   Procedure: ESOPHAGOGASTRODUODENOSCOPY (EGD) WITH PROPOFOL ;  Surgeon: Brice Campi Albino Alu., MD;  Location: [facility] ENDOSCOPY;  Service: Gastroenterology;  Laterality: N/A;   EUS N/A 01/02/2020   Procedure: UPPER ENDOSCOPIC ULTRASOUND (EUS) RADIAL;  Surgeon: Janel Medford, MD;  Location: [facility] ENDOSCOPY;  Service: Endoscopy;  Laterality: N/A;   EUS N/A 03/06/2023   Procedure: UPPER ENDOSCOPIC ULTRASOUND (EUS) RADIAL;  Surgeon: Normie Becton., MD;  Location: [facility] ENDOSCOPY;  Service: Gastroenterology;   Laterality: N/A;   EYE SURGERY Left 1990's   "result of trauma"    FACIAL FRACTURE SURGERY Left 1990's   "result of trauma"    FINE NEEDLE ASPIRATION N/A 03/06/2023   Procedure: FINE NEEDLE ASPIRATION (FNA)  LINEAR;  Surgeon: Brice Campi Albino Alu., MD;  Location: [facility] ENDOSCOPY;  Service: Gastroenterology;  Laterality: N/A;   FLEXIBLE SIGMOIDOSCOPY N/A 10/25/2020   Procedure: FLEXIBLE SIGMOIDOSCOPY;  Surgeon: Brice Campi Albino Alu., MD;  Location: [facility] ENDOSCOPY;  Service: Gastroenterology;  Laterality: N/A;   FRACTURE SURGERY     HEMOSTASIS CLIP PLACEMENT  10/25/2020   Procedure: HEMOSTASIS CLIP PLACEMENT;  Surgeon: Normie Becton., MD;  Location: [facility] ENDOSCOPY;  Service: Gastroenterology;;   HERNIA REPAIR     HOT HEMOSTASIS N/A 03/06/2023   Procedure: HOT HEMOSTASIS (ARGON PLASMA COAGULATION/BICAP);  Surgeon: Normie Becton., MD;  Location: [facility] ENDOSCOPY;  Service: Gastroenterology;  Laterality: N/A;   LEFT HEART CATHETERIZATION WITH CORONARY ANGIOGRAM Right 03/07/2013   Procedure: LEFT HEART CATHETERIZATION WITH CORONARY ANGIOGRAM;  Surgeon: Darrold Emms, MD;  Location: [facility] CATH LAB;  Service: Cardiovascular;  Laterality: Right;   UMBILICAL HERNIA REPAIR     UPPER GASTROINTESTINAL ENDOSCOPY     Family History Family History  Problem Relation Age of Onset   Hypertension Mother    Cirrhosis Father    Alcoholism Father    Hypertension Father    Melanoma Father    Hypertension Other    Coronary artery disease Other     Social History Social History   Tobacco Use   Smoking status: Every Day    Current packs/day: 1.00    Average packs/day: 1 pack/day for 36.0 years (36.0 ttl pk-yrs)    Types: Cigarettes, E-cigarettes   Smokeless tobacco: Never  Vaping Use   Vaping status: Former  Substance Use Topics   Alcohol  use: Not Currently    Alcohol /week: 4.0 standard drinks of alcohol     Types: 4 Cans of beer per week   Drug use: Not Currently    Types: Marijuana,  Cocaine   Allergies Robaxin  [methocarbamol ], Aspirin , Shellfish-derived products, Trazodone  and nefazodone, Adhesive [tape], Fish-derived products, Latex, Toradol  [ketorolac  tromethamine ], Contrast media [iodinated contrast media], Reglan  [metoclopramide ], and Salmon [fish oil]  Review of Systems Review of Systems  All other systems reviewed and are negative.   Physical Exam Vital Signs  I have reviewed the triage vital signs BP (!) 160/104   Pulse 71   Temp (!) 97.3 F (36.3 C) (Oral)   Resp 19   Ht 5\' 8"  (1.727 m)   Wt 57.2 kg   SpO2 100%   BMI 19.16 kg/m  Physical Exam Vitals and nursing note reviewed.  Constitutional:      General: He is not in acute distress.    Appearance: Normal appearance.  HENT:     Mouth/Throat:     Mouth: Mucous membranes are moist.  Eyes:     Conjunctiva/sclera: Conjunctivae normal.  Cardiovascular:     Rate and Rhythm: Normal rate and regular rhythm.  Pulmonary:     Effort: Pulmonary effort is normal. No respiratory distress.     Breath sounds: Normal breath sounds.  Abdominal:     General: Abdomen is flat.     Palpations: Abdomen is soft.     Tenderness: There is no abdominal tenderness.  Musculoskeletal:     Right lower leg: No edema.     Left lower leg: No edema.  Skin:    General: Skin is warm and dry.     Capillary Refill: Capillary refill takes less than 2 seconds.  Neurological:     Mental Status: He is alert and oriented to person, place, and time. Mental status is at baseline.  Psychiatric:  Mood and Affect: Mood normal.        Behavior: Behavior normal.     ED Results and Treatments Labs (all labs ordered are listed, but only abnormal results are displayed) Labs Reviewed  COMPREHENSIVE METABOLIC PANEL WITH GFR - Abnormal; Notable for the following components:      Result Value   Chloride 115 (*)    CO2 15 (*)    Calcium  8.0 (*)    Total Protein 6.4 (*)    Albumin  3.4 (*)    AST 83 (*)    All other  components within normal limits  CBC WITH DIFFERENTIAL/PLATELET - Abnormal; Notable for the following components:   RBC 3.66 (*)    Hemoglobin 10.7 (*)    HCT 32.9 (*)    RDW 20.5 (*)    All other components within normal limits  LIPASE, BLOOD  URINALYSIS, ROUTINE W REFLEX MICROSCOPIC  TROPONIN I (HIGH SENSITIVITY)                                                                                                                          Radiology DG Chest 2 View Result Date: 10/11/2023 CLINICAL DATA:  Chest pain and shortness of breath. EXAM: CHEST - 2 VIEW COMPARISON:  10/05/2023. FINDINGS: The cardiomediastinal contours are normal. Mild chronic hyperinflation. Pulmonary vasculature is normal. No consolidation, pleural effusion, or pneumothorax. No acute osseous abnormalities are seen. IMPRESSION: Mild chronic hyperinflation. No acute chest findings. Electronically Signed   By: Chadwick Colonel M.D.   On: 10/11/2023 20:47    Pertinent labs & imaging results that were available during my care of the patient were reviewed by me and considered in my medical decision making (see MDM for details).  Medications Ordered in ED Medications  alum & mag hydroxide-simeth (MAALOX/MYLANTA) 200-200-20 MG/5ML suspension 30 mL (30 mLs Oral Given 10/11/23 2111)    And  lidocaine  (XYLOCAINE ) 2 % viscous mouth solution 15 mL (15 mLs Oral Given 10/11/23 2111)  HYDROmorphone  (DILAUDID ) injection 0.5 mg (0.5 mg Intravenous Given 10/11/23 2110)  ondansetron  (ZOFRAN ) injection 4 mg (4 mg Intravenous Given 10/11/23 2110)  sodium chloride  0.9 % bolus 1,000 mL (0 mLs Intravenous Stopped 10/11/23 2223)  HYDROmorphone  (DILAUDID ) injection 0.5 mg (0.5 mg Intravenous Given 10/11/23 2222)  oxyCODONE -acetaminophen  (PERCOCET/ROXICET) 5-325 MG per tablet 1 tablet (1 tablet Oral Given 10/11/23 2240)  Procedures Procedures  (including critical care time)  Medical Decision Making / ED Course   MDM:  53 year old presenting to the emergency department with epigastric pain.  Patient reports pain is very similar to his chronic pancreatitis pain.  Examination without any focal tenderness.  He also has chronic gastritis so it might be related to this as well.  Labs are overall reassuring.  Do show mildly low CO2, likely due to dehydration, no anion gap.  Patient received IV fluids and feels better.  Also received some IV pain medication and nausea medication and reports improvement.  Also was able to tolerate p.o. and keep down GI cocktail.  Low concern for acute intra-abdominal process such as perforation, obstruction, volvulus, appendicitis, cholecystitis, diverticulitis.  He has ongoing follow-up with GI as outpatient.  He request that we prescribe a narcotic, reviewed PDMP, patient does have frequent narcotic prescriptions but does have actual pathology so we will give very small dose of oxycodone , recommended close follow-up with primary doctor, safe use of this medicine.  He also requests refill of his viscous lidocaine  which he reports has helped him with this kind of pain before previously as well, so we will prescribe this.  Will discharge patient to home. All questions answered. Patient comfortable with plan of discharge. Return precautions discussed with patient and specified on the after visit summary.       Additional history obtained: -Additional history obtained from ems -External records from outside source obtained and reviewed including: Chart review including previous notes, labs, imaging, consultation notes including prior ER visits    Lab Tests: -I ordered, reviewed, and interpreted labs.   The pertinent results include:   Labs Reviewed  COMPREHENSIVE METABOLIC PANEL WITH GFR - Abnormal; Notable for the following components:      Result Value   Chloride 115 (*)     CO2 15 (*)    Calcium  8.0 (*)    Total Protein 6.4 (*)    Albumin  3.4 (*)    AST 83 (*)    All other components within normal limits  CBC WITH DIFFERENTIAL/PLATELET - Abnormal; Notable for the following components:   RBC 3.66 (*)    Hemoglobin 10.7 (*)    HCT 32.9 (*)    RDW 20.5 (*)    All other components within normal limits  LIPASE, BLOOD  URINALYSIS, ROUTINE W REFLEX MICROSCOPIC  TROPONIN I (HIGH SENSITIVITY)    Notable for mild acidosis without anion gap   EKG   EKG Interpretation Date/Time:  Wednesday October 11 2023 20:10:18 EDT Ventricular Rate:  102 PR Interval:  96 QRS Duration:  70 QT Interval:  312 QTC Calculation: 406 R Axis:   70  Text Interpretation: Sinus tachycardia with short PR Biatrial enlargement Nonspecific T wave abnormality Abnormal ECG When compared with ECG of 24-Sep-2023 18:36, PREVIOUS ECG IS PRESENT Confirmed by Hiawatha Lout ([id number]) on 10/11/2023 10:08:49 PM         Imaging Studies ordered: I ordered imaging studies including CXR On my interpretation imaging demonstrates no acute process I independently visualized and interpreted imaging. I agree with the radiologist interpretation   Medicines ordered and prescription drug management: Meds ordered this encounter  Medications   AND Linked Order Group    alum & mag hydroxide-simeth (MAALOX/MYLANTA) 200-200-20 MG/5ML suspension 30 mL    lidocaine  (XYLOCAINE ) 2 % viscous mouth solution 15 mL   HYDROmorphone  (DILAUDID ) injection 0.5 mg   ondansetron  (ZOFRAN ) injection 4 mg   sodium chloride  0.9 % bolus  1,000 mL   HYDROmorphone  (DILAUDID ) injection 0.5 mg   oxyCODONE  (ROXICODONE ) 5 MG immediate release tablet    Sig: Take 1 tablet (5 mg total) by mouth every 6 (six) hours as needed.    Dispense:  8 tablet    Refill:  0   lidocaine  (XYLOCAINE ) 2 % solution    Sig: Use as directed 15 mLs in the mouth or throat every 12 (twelve) hours as needed.    Dispense:  100 mL    Refill:  0    oxyCODONE -acetaminophen  (PERCOCET/ROXICET) 5-325 MG per tablet 1 tablet    Refill:  0    -I have reviewed the patients home medicines and have made adjustments as needed    Reevaluation: After the interventions noted above, I reevaluated the patient and found that their symptoms have improved  Co morbidities that complicate the patient evaluation  Past Medical History:  Diagnosis Date   Alcoholism /alcohol  abuse    Anemia    Anxiety    Arthritis    knees; arms; elbows (03/26/2015)   Asthma    Bipolar disorder (HCC)    Chronic bronchitis (HCC)    Chronic lower back pain    Chronic pancreatitis (HCC)    Cocaine abuse (HCC)    Depression    Family history of adverse reaction to anesthesia    Femoral condyle fracture (HCC) 03/08/2014   left medial/notes 03/09/2014   GERD (gastroesophageal reflux disease)    H/O hiatal hernia    H/O suicide attempt 10/2012   High cholesterol    History of blood transfusion 10/2012   when I tried to commit suicide   History of stomach ulcers    Hypertension    Marijuana abuse, continuous    Migraine    a few times/year (03/26/2015)   Pancreatitis    Pneumonia 1990's X 3   PTSD (post-traumatic stress disorder)    Seizures (HCC)    Sickle cell trait (HCC)    WPW (Wolff-Parkinson-White syndrome)    Maximo Spar 03/06/2013      Dispostion: Disposition decision including need for hospitalization was considered, and patient discharged from emergency department.    Final Clinical Impression(s) / ED Diagnoses Final diagnoses:  Chronic pancreatitis, unspecified pancreatitis type [facility])     This chart was dictated using voice recognition software.  Despite best efforts to proofread,  errors can occur which can change the documentation meaning.    Mordecai Applebaum, MD 10/11/23 [phone]

## 2023-10-11 NOTE — ED Triage Notes (Addendum)
 Arrives [facility] from home with c/o epigastric pains. Pain worse for past hour and says he was shob when walking around the house.   Chronic abdominal pain.   Says his doctors recently changes medications for WPW.   Paramedics admin 100mcg Fentanyl  pta.

## 2023-10-11 NOTE — Discharge Instructions (Signed)
 Please take your oxycodone  as prescribed and don't mix it with alcohol  or drive while taking it.

## 2023-10-13 ENCOUNTER — Other Ambulatory Visit: Payer: Self-pay

## 2023-10-13 ENCOUNTER — Other Ambulatory Visit (HOSPITAL_COMMUNITY): Payer: MEDICAID

## 2023-10-13 ENCOUNTER — Emergency Department (HOSPITAL_COMMUNITY)
Admission: EM | Admit: 2023-10-13 | Discharge: 2023-10-13 | Disposition: A | Discharge status: Home / Self Care | Payer: MEDICAID | Attending: Emergency Medicine | Admitting: Emergency Medicine

## 2023-10-13 ENCOUNTER — Emergency Department (HOSPITAL_COMMUNITY): Payer: MEDICAID

## 2023-10-13 DIAGNOSIS — Z91040 Latex allergy status: Secondary | ICD-10-CM | POA: Insufficient documentation

## 2023-10-13 DIAGNOSIS — G8929 Other chronic pain: Secondary | ICD-10-CM | POA: Diagnosis not present

## 2023-10-13 DIAGNOSIS — R112 Nausea with vomiting, unspecified: Secondary | ICD-10-CM | POA: Insufficient documentation

## 2023-10-13 DIAGNOSIS — D649 Anemia, unspecified: Secondary | ICD-10-CM | POA: Diagnosis not present

## 2023-10-13 DIAGNOSIS — R1013 Epigastric pain: Secondary | ICD-10-CM | POA: Insufficient documentation

## 2023-10-13 LAB — CBC
HCT: 36.5 % — ABNORMAL LOW (ref 39.0–52.0)
Hemoglobin: 11.9 g/dL — ABNORMAL LOW (ref 13.0–17.0)
MCH: 29.8 pg (ref 26.0–34.0)
MCHC: 32.6 g/dL (ref 30.0–36.0)
MCV: 91.5 fL (ref 80.0–100.0)
Platelets: 161 10*3/uL (ref 150–400)
RBC: 3.99 MIL/uL — ABNORMAL LOW (ref 4.22–5.81)
RDW: 20.3 % — ABNORMAL HIGH (ref 11.5–15.5)
WBC: 9.4 10*3/uL (ref 4.0–10.5)
nRBC: 0 % (ref 0.0–0.2)

## 2023-10-13 LAB — BASIC METABOLIC PANEL WITH GFR
Anion gap: 16 — ABNORMAL HIGH (ref 5–15)
BUN: 5 mg/dL — ABNORMAL LOW (ref 6–20)
CO2: 14 mmol/L — ABNORMAL LOW (ref 22–32)
Calcium: 7.8 mg/dL — ABNORMAL LOW (ref 8.9–10.3)
Chloride: 108 mmol/L (ref 98–111)
Creatinine, Ser: 0.97 mg/dL (ref 0.61–1.24)
GFR, Estimated: >60 mL/min (ref >60)
Glucose, Bld: 76 mg/dL (ref 70–99)
Potassium: 3.7 mmol/L (ref 3.5–5.1)
Sodium: 138 mmol/L (ref 135–145)

## 2023-10-13 LAB — HEPATIC FUNCTION PANEL
ALT: 31 U/L (ref 0–44)
AST: 34 U/L (ref 15–41)
Albumin: 4 g/dL (ref 3.5–5.0)
Alkaline Phosphatase: 77 U/L (ref 38–126)
Bilirubin, Direct: 0.2 mg/dL (ref 0.0–0.2)
Indirect Bilirubin: 0.5 mg/dL (ref 0.3–0.9)
Total Bilirubin: 0.7 mg/dL (ref 0.0–1.2)
Total Protein: 7.3 g/dL (ref 6.5–8.1)

## 2023-10-13 LAB — TROPONIN I (HIGH SENSITIVITY)
Troponin I (High Sensitivity): 4 ng/L (ref <18)
Troponin I (High Sensitivity): 8 ng/L (ref <18)

## 2023-10-13 LAB — PROTIME-INR
INR: 0.9 (ref 0.8–1.2)
Prothrombin Time: 12.6 s (ref 11.4–15.2)

## 2023-10-13 LAB — LIPASE, BLOOD: Lipase: 21 U/L (ref 11–51)

## 2023-10-13 LAB — ETHANOL: Alcohol, Ethyl (B): <15 mg/dL (ref <15)

## 2023-10-13 MED ORDER — PANTOPRAZOLE SODIUM 20 MG PO TBEC: 20.0000 mg | DELAYED_RELEASE_TABLET | Freq: Every day | ORAL | 0 refills | Status: DC

## 2023-10-13 MED ORDER — PROCHLORPERAZINE EDISYLATE 10 MG/2ML IJ SOLN
10.0000 mg | INTRAMUSCULAR | Status: AC
  Administered 2023-10-13: 10 mg via INTRAVENOUS
  Filled 2023-10-13: qty 2

## 2023-10-13 MED ORDER — ONDANSETRON 4 MG PO TBDP
4.0000 mg | ORAL_TABLET | Freq: Once | ORAL | Status: AC
  Administered 2023-10-13: 4 mg via ORAL
  Filled 2023-10-13: qty 1

## 2023-10-13 MED ORDER — LACTATED RINGERS IV BOLUS
1000.0000 mL | Freq: Once | INTRAVENOUS | Status: AC
  Administered 2023-10-13: 1000 mL via INTRAVENOUS

## 2023-10-13 MED ORDER — OXYCODONE HCL 5 MG PO TABS
5.0000 mg | ORAL_TABLET | ORAL | Status: AC
  Administered 2023-10-13: 5 mg via ORAL
  Filled 2023-10-13: qty 1

## 2023-10-13 MED ORDER — PANTOPRAZOLE SODIUM 40 MG IV SOLR
40.0000 mg | Freq: Once | INTRAVENOUS | Status: AC
  Administered 2023-10-13: 40 mg via INTRAVENOUS
  Filled 2023-10-13: qty 10

## 2023-10-13 MED ORDER — LIDOCAINE VISCOUS HCL 2 % MT SOLN: 15.0000 mL | Freq: Two times a day (BID) | OROMUCOSAL | 0 refills | Status: DC | PRN

## 2023-10-13 MED ORDER — LORAZEPAM 2 MG/ML IJ SOLN
1.0000 mg | Freq: Once | INTRAMUSCULAR | Status: AC
  Administered 2023-10-13: 1 mg via INTRAVENOUS
  Filled 2023-10-13: qty 1

## 2023-10-13 NOTE — ED Notes (Signed)
Sandwich and beverage provided.

## 2023-10-13 NOTE — ED Provider Notes (Signed)
 [facility] EMERGENCY DEPARTMENT AT [facility] Provider Note   CSN: [id number] Arrival date & time: 10/13/23  0354     History  Chief Complaint  Patient presents with   Chest Pain    [name] is a 53 y.o. male.  HPI 53 year old male history of chronic pancreatitis and alcohol  abuse presents today with ongoing epigastric pain, nausea, vomiting.  Patient states symptoms began on Wednesday and have been ongoing.  He has been unable to drink alcohol  since Wednesday.  He has been unable to take his medications or keep them down.  He feels that this is consistent with his prior pancreatitis.  He denies fever, chills, dyspnea, chest pain.  He states he has been unable to keep down his Pancrease, lidocaine , or other medications     Home Medications Prior to Admission medications   Medication Sig Start Date End Date Taking? Authorizing Provider  acetaminophen  (TYLENOL ) 500 MG tablet Take 1,000 mg by mouth daily as needed for mild pain (pain score 1-3) or moderate pain (pain score 4-6).    [provider]  calcium -vitamin D  (OSCAL WITH D) 500-5 MG-MCG tablet Take 1 tablet by mouth daily with breakfast. 09/18/23   Ninetta Basket, MD  dicyclomine  (BENTYL ) 20 MG tablet Take 1 tablet (20 mg total) by mouth daily as needed for spasms. Patient not taking: Reported on 08/26/2023 08/21/23   Burton Casey, MD  famotidine  (PEPCID ) 40 MG tablet Take 1 tablet (40 mg total) by mouth daily. 09/19/23   Ninetta Basket, MD  fluticasone  (FLONASE ) 50 MCG/ACT nasal spray Place 2 sprays into both nostrils daily as needed for allergies.    [provider]  folic acid  (FOLVITE ) 1 MG tablet Take 1 tablet (1 mg total) by mouth daily. Patient not taking: Reported on 09/18/2023 08/21/23   Burton Casey, MD  lidocaine  (XYLOCAINE ) 2 % solution Use as directed 15 mLs in the mouth or throat every 12 (twelve) hours as needed. 10/13/23   Auston Blush, MD   lipase/protease/amylase (CREON ) 36000 UNITS CPEP capsule Take 2 capsules (72,000 Units total) by mouth 3 (three) times daily with meals. May also take 1 capsule (36,000 Units total) as needed (with snacks). 08/21/23   Ghimire, Estil Heman, MD  loperamide  (IMODIUM ) 2 MG capsule Take 2 capsules (4 mg total) by mouth as needed for diarrhea or loose stools. 09/25/23   Mesner, Reymundo Caulk, MD  magnesium  oxide (MAG-OX) 400 (240 Mg) MG tablet Take 1 tablet (400 mg total) by mouth daily. Patient taking differently: Take 400 mg by mouth 2 (two) times daily. 08/21/23   Ghimire, Estil Heman, MD  metoprolol  (TOPROL -XL) 200 MG 24 hr tablet Take 1 tablet (200 mg total) by mouth daily. 08/21/23   Ghimire, Estil Heman, MD  Multiple Vitamin (MULTIVITAMIN WITH MINERALS) TABS tablet Take 1 tablet by mouth daily. 08/21/23   Ghimire, Estil Heman, MD  ondansetron  (ZOFRAN ) 4 MG tablet Take 1 tablet (4 mg total) by mouth every 6 (six) hours. 09/18/23   Ninetta Basket, MD  ondansetron  (ZOFRAN -ODT) 4 MG disintegrating tablet Take 1 tablet (4 mg total) by mouth daily as needed for nausea or vomiting. 08/21/23   Ghimire, Estil Heman, MD  oxyCODONE  (ROXICODONE ) 5 MG immediate release tablet Take 1 tablet (5 mg total) by mouth every 6 (six) hours as needed. 10/11/23   Mordecai Applebaum, MD  pantoprazole  (PROTONIX ) 20 MG tablet Take 1 tablet (20 mg total) by mouth daily. 10/03/23   Eliseo Guiles,  Robert, PA-C  psyllium (HYDROCIL/METAMUCIL) 95 % PACK Take 1 packet by mouth daily. Patient not taking: Reported on 09/18/2023 08/21/23   Burton Casey, MD  sodium zirconium cyclosilicate  (LOKELMA ) 10 g PACK packet Take 10 g by mouth 2 (two) times daily. Patient not taking: Reported on 08/26/2023 08/21/23   Burton Casey, MD  sucralfate  (CARAFATE ) 1 g tablet Take 1 tablet (1 g total) by mouth in the morning, at noon, and at bedtime. 09/02/23   Marius Siemens, NP  tamsulosin  (FLOMAX ) 0.4 MG CAPS capsule Take 1 capsule (0.4 mg total) by mouth daily.  08/21/23   Ghimire, Estil Heman, MD  thiamine  (VITAMIN B1) 100 MG tablet Take 1 tablet (100 mg total) by mouth daily. 08/21/23   Ghimire, Estil Heman, MD  amitriptyline  (ELAVIL ) 25 MG tablet Take 1 tablet (25 mg total) by mouth at bedtime. Patient not taking: Reported on 08/08/2019 10/15/18 08/08/19  Kraig Peru, MD      Allergies    Robaxin  [methocarbamol ], Aspirin , Shellfish-derived products, Trazodone  and nefazodone, Adhesive [tape], Fish-derived products, Latex, Toradol  [ketorolac  tromethamine ], Contrast media [iodinated contrast media], Reglan  [metoclopramide ], and Clent Czar oil]    Review of Systems   Review of Systems  Physical Exam Updated Vital Signs BP (!) 167/102   Pulse 67   Temp 97.8 F (36.6 C) (Oral)   Resp 16   SpO2 100%  Physical Exam Vitals and nursing note reviewed.  Constitutional:      General: He is not in acute distress.    Appearance: He is well-developed.  HENT:     Head: Normocephalic.  Eyes:     Pupils: Pupils are equal, round, and reactive to light.  Cardiovascular:     Rate and Rhythm: Normal rate and regular rhythm.     Heart sounds: Normal heart sounds.  Pulmonary:     Effort: Pulmonary effort is normal.     Breath sounds: Normal breath sounds.  Abdominal:     General: Bowel sounds are normal.     Palpations: Abdomen is soft.     Comments: Tenderness to palpation epigastrium  Musculoskeletal:        General: Normal range of motion.     Cervical back: Normal range of motion.  Skin:    General: Skin is warm and dry.     Capillary Refill: Capillary refill takes less than 2 seconds.  Neurological:     General: No focal deficit present.     Mental Status: He is alert.     ED Results / Procedures / Treatments   Labs (all labs ordered are listed, but only abnormal results are displayed) Labs Reviewed  BASIC METABOLIC PANEL WITH GFR - Abnormal; Notable for the following components:      Result Value   CO2 14 (*)    BUN 5 (*)    Calcium  7.8  (*)    Anion gap 16 (*)    All other components within normal limits  CBC - Abnormal; Notable for the following components:   RBC 3.99 (*)    Hemoglobin 11.9 (*)    HCT 36.5 (*)    RDW 20.3 (*)    All other components within normal limits  PROTIME-INR  LIPASE, BLOOD  HEPATIC FUNCTION PANEL  ETHANOL  RAPID URINE DRUG SCREEN, HOSP PERFORMED  URINALYSIS, ROUTINE W REFLEX MICROSCOPIC  TROPONIN I (HIGH SENSITIVITY)  TROPONIN I (HIGH SENSITIVITY)    EKG None  Radiology DG Chest 2 View Result Date: 10/13/2023 EXAM:  2 VIEW(S) XRAY OF THE CHEST 10/13/2023 04:16:44 AM COMPARISON: 2-view chest x-Chidera Thivierge 10/11/2023. CLINICAL HISTORY: Chest pain. FINDINGS: LUNGS AND PLEURA: Mild hyperinflation is again noted. No focal pulmonary opacity. No pulmonary edema. No pleural effusion. No pneumothorax. HEART AND MEDIASTINUM: No acute abnormality of the cardiac and mediastinal silhouettes. BONES AND SOFT TISSUES: No acute osseous abnormality. IMPRESSION: 1. No acute findings. Electronically signed by: Audree Leas MD 10/13/2023 04:39 AM EDT RP Workstation: [id number]   DG Chest 2 View Result Date: 10/11/2023 CLINICAL DATA:  Chest pain and shortness of breath. EXAM: CHEST - 2 VIEW COMPARISON:  10/05/2023. FINDINGS: The cardiomediastinal contours are normal. Mild chronic hyperinflation. Pulmonary vasculature is normal. No consolidation, pleural effusion, or pneumothorax. No acute osseous abnormalities are seen. IMPRESSION: Mild chronic hyperinflation. No acute chest findings. Electronically Signed   By: Chadwick Colonel M.D.   On: 10/11/2023 20:47    Procedures Procedures    Medications Ordered in ED Medications  ondansetron  (ZOFRAN -ODT) disintegrating tablet 4 mg (4 mg Oral Given 10/13/23 0426)  lactated ringers  bolus 1,000 mL (1,000 mLs Intravenous New Bag/Given 10/13/23 0944)  pantoprazole  (PROTONIX ) injection 40 mg (40 mg Intravenous Given 10/13/23 0944)  oxyCODONE  (Oxy IR/ROXICODONE ) immediate release  tablet 5 mg (5 mg Oral Given 10/13/23 1055)  prochlorperazine  (COMPAZINE ) injection 10 mg (10 mg Intravenous Given 10/13/23 1054)  LORazepam  (ATIVAN ) injection 1 mg (1 mg Intravenous Given 10/13/23 1140)    ED Course/ Medical Decision Making/ A&P                                 Medical Decision Making Amount and/or Complexity of Data Reviewed Labs: ordered. Radiology: ordered.  Risk Prescription drug management.   53 year old male with known chronic pancreatitis and chronic abdominal pain presents today with ongoing epigastric pain nausea and vomiting.  Patient has been evaluated multiple times within the past month including here and at [facility] for similar symptoms.  Abdomen is soft there is mild tenderness in the epigastrium.  He is initially hypertensive.  Patient was treated here with nausea medications, pain medications, and Ativan .  Labs are normal with the exception of mild anemia hemoglobin 11.9 and initial CO2 decreased at 14.  Patient was treated with IV fluids also.  SPECT that he has had some decrease in CO2 secondary to nausea vomiting and maybe some mild volume depletion.  Here he is hydrated.  He has no evidence of DKA with normal glucose at 76, doubt sepsis or lactic acidosis, suspect secondary to ketoacidosis from etoh and poor po intake. Patient improved after fluids, ativan  and taking po. Requesting narcotic and lidocaine .  Will refill lidocaine  and patient advised to have narcotics from pmd         Final Clinical Impression(s) / ED Diagnoses Final diagnoses:  Chronic abdominal pain    Rx / DC Orders ED Discharge Orders          Ordered    lidocaine  (XYLOCAINE ) 2 % solution  Every 12 hours PRN        10/13/23 1151              Auston Blush, MD 10/13/23 1152

## 2023-10-13 NOTE — ED Provider Triage Note (Signed)
 Emergency Medicine Provider Triage Evaluation Note  [name] , a 53 y.o. male  was evaluated in triage.  Pt complains of chest pain and epigastric pain.  Feels like it may be his pancreatitis again.  Seen here yesterday for same.  Following with GI and taking his creon  as directed.  Denies alcohol  use or drug use recently (smoked marijuana last week).  Review of Systems  Positive: Chest pain, abdominal pain Negative: fever  Physical Exam  BP 138/74 (BP Location: Left Arm)   Pulse 78   Temp 98.4 F (36.9 C) (Oral)   Resp 14   SpO2 99%   Gen:   Awake, no distress   Resp:  Normal effort  MSK:   Moves extremities without difficulty  Other:    Medical Decision Making  Medically screening exam initiated at 4:19 AM.  Appropriate orders placed.  [name] was informed that the remainder of the evaluation will be completed by another provider, this initial triage assessment does not replace that evaluation, and the importance of remaining in the ED until their evaluation is complete.  Chest and abdominal pain.  Chronic issue for him, seen yesterday for same.  Appears at his baseline.  VSS.  Labs sent.   Coretha Dew, PA-C 10/13/23 [phone]

## 2023-10-13 NOTE — Discharge Instructions (Addendum)
 Please do not drink alcohol  Take your medications as prescribed Your lidocaine  solution has been refilled.

## 2023-10-13 NOTE — ED Triage Notes (Signed)
 Patient reports left chest pain last night radiating to left arm and left neck with mild SOB and emesis , received Zofran  4 mg IV and Fentanyl  100 mcg IV by EMS prior to arrival . CBG=101.

## 2023-10-17 ENCOUNTER — Ambulatory Visit: Payer: MEDICAID

## 2023-10-17 ENCOUNTER — Emergency Department (HOSPITAL_COMMUNITY): Payer: MEDICAID

## 2023-10-17 ENCOUNTER — Emergency Department (HOSPITAL_COMMUNITY)
Admission: EM | Admit: 2023-10-17 | Discharge: 2023-10-17 | Disposition: A | Discharge status: Home / Self Care | Payer: MEDICAID | Attending: Emergency Medicine | Admitting: Emergency Medicine

## 2023-10-17 DIAGNOSIS — K860 Alcohol-induced chronic pancreatitis: Secondary | ICD-10-CM | POA: Diagnosis not present

## 2023-10-17 DIAGNOSIS — Z91040 Latex allergy status: Secondary | ICD-10-CM | POA: Insufficient documentation

## 2023-10-17 DIAGNOSIS — D649 Anemia, unspecified: Secondary | ICD-10-CM | POA: Insufficient documentation

## 2023-10-17 DIAGNOSIS — Z79899 Other long term (current) drug therapy: Secondary | ICD-10-CM | POA: Insufficient documentation

## 2023-10-17 DIAGNOSIS — R1013 Epigastric pain: Secondary | ICD-10-CM | POA: Diagnosis present

## 2023-10-17 DIAGNOSIS — R112 Nausea with vomiting, unspecified: Secondary | ICD-10-CM

## 2023-10-17 LAB — COMPREHENSIVE METABOLIC PANEL WITH GFR
ALT: 37 U/L (ref 0–44)
AST: 55 U/L — ABNORMAL HIGH (ref 15–41)
Albumin: 4.1 g/dL (ref 3.5–5.0)
Alkaline Phosphatase: 87 U/L (ref 38–126)
Anion gap: 15 (ref 5–15)
BUN: 8 mg/dL (ref 6–20)
CO2: 18 mmol/L — ABNORMAL LOW (ref 22–32)
Calcium: 8.6 mg/dL — ABNORMAL LOW (ref 8.9–10.3)
Chloride: 108 mmol/L (ref 98–111)
Creatinine, Ser: 1.08 mg/dL (ref 0.61–1.24)
GFR, Estimated: >60 mL/min (ref >60)
Glucose, Bld: 99 mg/dL (ref 70–99)
Potassium: 5 mmol/L (ref 3.5–5.1)
Sodium: 141 mmol/L (ref 135–145)
Total Bilirubin: 0.6 mg/dL (ref 0.0–1.2)
Total Protein: 7.4 g/dL (ref 6.5–8.1)

## 2023-10-17 LAB — CBC WITH DIFFERENTIAL/PLATELET
Abs Immature Granulocytes: 0.01 10*3/uL (ref 0.00–0.07)
Basophils Absolute: 0 10*3/uL (ref 0.0–0.1)
Basophils Relative: 0 %
Eosinophils Absolute: 0.1 10*3/uL (ref 0.0–0.5)
Eosinophils Relative: 1 %
HCT: 38.3 % — ABNORMAL LOW (ref 39.0–52.0)
Hemoglobin: 12.3 g/dL — ABNORMAL LOW (ref 13.0–17.0)
Immature Granulocytes: 0 %
Lymphocytes Relative: 48 %
Lymphs Abs: 2.5 10*3/uL (ref 0.7–4.0)
MCH: 29.6 pg (ref 26.0–34.0)
MCHC: 32.1 g/dL (ref 30.0–36.0)
MCV: 92.1 fL (ref 80.0–100.0)
Monocytes Absolute: 0.6 10*3/uL (ref 0.1–1.0)
Monocytes Relative: 11 %
Neutro Abs: 2.1 10*3/uL (ref 1.7–7.7)
Neutrophils Relative %: 40 %
Platelets: 232 10*3/uL (ref 150–400)
RBC: 4.16 MIL/uL — ABNORMAL LOW (ref 4.22–5.81)
RDW: 20 % — ABNORMAL HIGH (ref 11.5–15.5)
WBC: 5.3 10*3/uL (ref 4.0–10.5)
nRBC: 0 % (ref 0.0–0.2)

## 2023-10-17 LAB — LIPASE, BLOOD: Lipase: 27 U/L (ref 11–51)

## 2023-10-17 LAB — MAGNESIUM: Magnesium: 1 mg/dL — ABNORMAL LOW (ref 1.7–2.4)

## 2023-10-17 LAB — TROPONIN I (HIGH SENSITIVITY): Troponin I (High Sensitivity): 5 ng/L (ref <18)

## 2023-10-17 MED ORDER — OXYCODONE HCL 5 MG PO TABS: 5.0000 mg | ORAL_TABLET | Freq: Four times a day (QID) | ORAL | 0 refills | Status: DC | PRN

## 2023-10-17 MED ORDER — HYDROMORPHONE HCL 1 MG/ML IJ SOLN
0.5000 mg | Freq: Once | INTRAMUSCULAR | Status: AC
  Administered 2023-10-17: 0.5 mg via INTRAVENOUS
  Filled 2023-10-17: qty 1

## 2023-10-17 MED ORDER — FAMOTIDINE 40 MG PO TABS
40.0000 mg | ORAL_TABLET | Freq: Every day | ORAL | 1 refills | Status: AC
Start: 2023-10-17 — End: ?

## 2023-10-17 MED ORDER — LIDOCAINE VISCOUS HCL 2 % MT SOLN
15.0000 mL | Freq: Once | OROMUCOSAL | Status: AC
  Administered 2023-10-17: 15 mL via ORAL
  Filled 2023-10-17: qty 15

## 2023-10-17 MED ORDER — ALUM & MAG HYDROXIDE-SIMETH 200-200-20 MG/5ML PO SUSP
30.0000 mL | Freq: Once | ORAL | Status: AC
  Administered 2023-10-17: 30 mL via ORAL
  Filled 2023-10-17: qty 30

## 2023-10-17 MED ORDER — SODIUM CHLORIDE 0.9 % IV BOLUS
1000.0000 mL | Freq: Once | INTRAVENOUS | Status: AC
  Administered 2023-10-17: 1000 mL via INTRAVENOUS

## 2023-10-17 MED ORDER — ONDANSETRON HCL 4 MG/2ML IJ SOLN
4.0000 mg | Freq: Once | INTRAMUSCULAR | Status: AC
  Administered 2023-10-17: 4 mg via INTRAVENOUS
  Filled 2023-10-17: qty 2

## 2023-10-17 MED ORDER — LIDOCAINE VISCOUS HCL 2 % MT SOLN: 15.0000 mL | Freq: Two times a day (BID) | OROMUCOSAL | 0 refills | Status: DC | PRN

## 2023-10-17 MED ORDER — MAGNESIUM SULFATE 2 GM/50ML IV SOLN
2.0000 g | Freq: Once | INTRAVENOUS | Status: AC
  Administered 2023-10-17: 2 g via INTRAVENOUS
  Filled 2023-10-17: qty 50

## 2023-10-17 MED ORDER — ONDANSETRON HCL 4 MG PO TABS: 4.0000 mg | ORAL_TABLET | Freq: Four times a day (QID) | ORAL | 0 refills | Status: DC

## 2023-10-17 NOTE — ED Notes (Signed)
 Called lab about a trop that never resulted. Lab states they forgot to run test, it is running now.

## 2023-10-17 NOTE — Therapy (Incomplete)
 OUTPATIENT PHYSICAL THERAPY TREATMENT   Patient Name: [name] MRN: [id number] DOB:August 22, 1970, 53 y.o., male Today's Date: 10/17/2023  END OF SESSION:     Past Medical History:  Diagnosis Date   Alcoholism /alcohol  abuse    Anemia    Anxiety    Arthritis    knees; arms; elbows (03/26/2015)   Asthma    Bipolar disorder (HCC)    Chronic bronchitis (HCC)    Chronic lower back pain    Chronic pancreatitis (HCC)    Cocaine abuse (HCC)    Depression    Family history of adverse reaction to anesthesia    Femoral condyle fracture (HCC) 03/08/2014   left medial/notes 03/09/2014   GERD (gastroesophageal reflux disease)    H/O hiatal hernia    H/O suicide attempt 10/2012   High cholesterol    History of blood transfusion 10/2012   when I tried to commit suicide   History of stomach ulcers    Hypertension    Marijuana abuse, continuous    Migraine    a few times/year (03/26/2015)   Pancreatitis    Pneumonia 1990's X 3   PTSD (post-traumatic stress disorder)    Seizures (HCC)    Sickle cell trait (HCC)    WPW (Wolff-Parkinson-White syndrome)    Maximo Spar 03/06/2013   Past Surgical History:  Procedure Laterality Date   BIOPSY  11/25/2017   Procedure: BIOPSY;  Surgeon: Evangeline Hilts, MD;  Location: [facility] ENDOSCOPY;  Service: Endoscopy;;   BIOPSY  10/14/2018   Procedure: BIOPSY;  Surgeon: Evangeline Hilts, MD;  Location: [facility] ENDOSCOPY;  Service: Endoscopy;;   BIOPSY  03/06/2023   Procedure: BIOPSY;  Surgeon: Normie Becton., MD;  Location: [facility] ENDOSCOPY;  Service: Gastroenterology;;   CARDIAC CATHETERIZATION     CYST ENTEROSTOMY  01/02/2020   Procedure: CYST ASPIRATION;  Surgeon: Janel Medford, MD;  Location: [facility] ENDOSCOPY;  Service: Endoscopy;;   ESOPHAGOGASTRODUODENOSCOPY N/A 03/06/2023   Procedure: ESOPHAGOGASTRODUODENOSCOPY (EGD);  Surgeon: Normie Becton., MD;  Location: [facility] ENDOSCOPY;  Service: Gastroenterology;  Laterality: N/A;    ESOPHAGOGASTRODUODENOSCOPY (EGD) WITH PROPOFOL  N/A 11/25/2017   Procedure: ESOPHAGOGASTRODUODENOSCOPY (EGD) WITH PROPOFOL ;  Surgeon: Evangeline Hilts, MD;  Location[facility] ENDOSCOPY;  Service: Endoscopy;  Laterality: N/A;   ESOPHAGOGASTRODUODENOSCOPY (EGD) WITH PROPOFOL  Left 10/14/2018   Procedure: ESOPHAGOGASTRODUODENOSCOPY (EGD) WITH PROPOFOL ;  Surgeon: Evangeline Hilts, MD;  Location: [facility] ENDOSCOPY;  Service: Endoscopy;  Laterality: Left;   ESOPHAGOGASTRODUODENOSCOPY (EGD) WITH PROPOFOL  N/A 11/14/2018   Procedure: ESOPHAGOGASTRODUODENOSCOPY (EGD) WITH PROPOFOL ;  Surgeon: Jolinda Necessary, MD;  Location: [facility] ENDOSCOPY;  Service: Gastroenterology;  Laterality: N/A;   ESOPHAGOGASTRODUODENOSCOPY (EGD) WITH PROPOFOL  N/A 01/02/2020   Procedure: ESOPHAGOGASTRODUODENOSCOPY (EGD) WITH PROPOFOL ;  Surgeon: Janel Medford, MD;  Location: [facility] ENDOSCOPY;  Service: Endoscopy;  Laterality: N/A;   ESOPHAGOGASTRODUODENOSCOPY (EGD) WITH PROPOFOL  N/A 10/25/2020   Procedure: ESOPHAGOGASTRODUODENOSCOPY (EGD) WITH PROPOFOL ;  Surgeon: Brice Campi Albino Alu., MD;  Location: [facility] ENDOSCOPY;  Service: Gastroenterology;  Laterality: N/A;   EUS N/A 01/02/2020   Procedure: UPPER ENDOSCOPIC ULTRASOUND (EUS) RADIAL;  Surgeon: Janel Medford, MD;  Location: [facility] ENDOSCOPY;  Service: Endoscopy;  Laterality: N/A;   EUS N/A 03/06/2023   Procedure: UPPER ENDOSCOPIC ULTRASOUND (EUS) RADIAL;  Surgeon: Normie Becton., MD;  Location: [facility] ENDOSCOPY;  Service: Gastroenterology;  Laterality: N/A;   EYE SURGERY Left 1990's   "result of trauma"    FACIAL FRACTURE SURGERY Left 1990's   "result of trauma"    FINE NEEDLE ASPIRATION N/A 03/06/2023   Procedure: FINE  NEEDLE ASPIRATION (FNA) LINEAR;  Surgeon: Brice Campi Albino Alu., MD;  Location: [facility] ENDOSCOPY;  Service: Gastroenterology;  Laterality: N/A;   FLEXIBLE SIGMOIDOSCOPY N/A 10/25/2020   Procedure: FLEXIBLE SIGMOIDOSCOPY;  Surgeon: Brice Campi Albino Alu., MD;  Location: [facility] ENDOSCOPY;   Service: Gastroenterology;  Laterality: N/A;   FRACTURE SURGERY     HEMOSTASIS CLIP PLACEMENT  10/25/2020   Procedure: HEMOSTASIS CLIP PLACEMENT;  Surgeon: Normie Becton., MD;  Location: [facility] ENDOSCOPY;  Service: Gastroenterology;;   HERNIA REPAIR     HOT HEMOSTASIS N/A 03/06/2023   Procedure: HOT HEMOSTASIS (ARGON PLASMA COAGULATION/BICAP);  Surgeon: Normie Becton., MD;  Location: [facility] ENDOSCOPY;  Service: Gastroenterology;  Laterality: N/A;   LEFT HEART CATHETERIZATION WITH CORONARY ANGIOGRAM Right 03/07/2013   Procedure: LEFT HEART CATHETERIZATION WITH CORONARY ANGIOGRAM;  Surgeon: Darrold Emms, MD;  Location: [facility] CATH LAB;  Service: Cardiovascular;  Laterality: Right;   UMBILICAL HERNIA REPAIR     UPPER GASTROINTESTINAL ENDOSCOPY     Patient Active Problem List   Diagnosis Date Noted   Intractable diarrhea 08/10/2023   Intractable nausea and vomiting 06/18/2023   History of peptic ulcer 03/07/2023   Lumbosacral radiculopathy 03/07/2023   Duodenitis 03/06/2023   Gastric AVM 03/06/2023   AVM (arteriovenous malformation) of small bowel, 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   Hyperkalemia 03/03/2023   Acute urinary retention 03/01/2023   Intractable pain 02/09/2023   Metabolic acidosis, increased anion gap 02/08/2023   Cocaine abuse (HCC) 02/08/2023   Adynamic ileus (HCC) 01/25/2023   Cannabinoid hyperemesis syndrome 01/25/2023   Epigastric abdominal pain 01/09/2023   Asthma, chronic 01/09/2023   Ileus (HCC) 01/09/2023   Hypokalemia 09/18/2022   NSVT (nonsustained ventricular tachycardia) (HCC) 01/20/2022   Normal anion gap metabolic acidosis 12/08/2021   Hypoalbuminemia 12/08/2021   Acute recurrent pancreatitis 12/07/2021   WPW (Wolff-Parkinson-White syndrome) 12/06/2021   Paroxysmal atrial fibrillation (HCC) 12/06/2021   Pulmonary nodule 12/06/2021   Leukocytosis 06/07/2021   Epigastric pain 06/07/2021   Acute alcoholic pancreatitis 06/06/2021   Urinary retention     Protein-calorie malnutrition, severe 11/17/2020   Acute pancreatitis 11/15/2020   Hypomagnesemia    Hypocalcemia 10/23/2020   AKI (acute kidney injury) (HCC) 11/13/2018   Seizure (HCC) 11/13/2018   Chest pain 01/08/2018   Acute on chronic pancreatitis (HCC) 09/28/2017   Abdominal pain 05/27/2017   Tachycardia 03/18/2017   Nausea, vomiting, and diarrhea 03/18/2017   Diarrhea 03/18/2017   GERD (gastroesophageal reflux disease)    Alcohol  use disorder 12/05/2016   Normocytic anemia 12/05/2016   Alcohol  use disorder, severe, dependence (HCC) 07/25/2016   Cocaine use disorder, severe, dependence (HCC) 07/25/2016   Major depressive disorder, 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   Chronic pancreatitis (HCC) 05/18/2015   Polysubstance abuse (tobacco, cocaine, THC, and ETOH) 03/26/2015   Essential hypertension 02/06/2014   Mood disorder (HCC) 02/06/2014   Pancreatic pseudocyst/cyst 11/25/2013   Zoanne Hinders White pattern seen on electrocardiogram 10/03/2012   Alcohol  abuse 03/23/2007   Tobacco abuse 03/23/2007    PCP: Marius Siemens, NP  REFERRING PROVIDER: Marius Siemens, NP  REFERRING DIAG:  K85.90 (ICD-10-CM) - Pancreatitis R53.81 (ICD-10-CM) - Physical deconditioning  THERAPY DIAG:  No diagnosis found.  Rationale for Evaluation and Treatment: Rehabilitation  ONSET DATE: Chronic  SUBJECTIVE:   SUBJECTIVE STATEMENT: ***  EVAL: Pt presents to PT with reports of increased deconditioning related to walking and R anterior LE pain that was exacerbated by broken toe two months ago. He can only walking about 5-10 minutes now  when he used to walk roughly 4 miles or 1 hour at a time. Pain has been limiting him but he also feels his LE are much weaker over the last few months/years. Burning and tingling sensation in R LE, PT recommended neurology consult especially with hx of seizure activity since he does not have one on care team. Also recommended  DPM visit for R toes and that pt reach out to PCP regarding this.   PERTINENT HISTORY: WPW, HTN, Drug/ETOH abuse, Depression, Bipolar Disorder  PAIN:  Are you having pain?  Yes: NPRS scale: 6/10 Worst: 10/10 Pain location: R anterior LE Pain description: burning, sharp Aggravating factors: walking, standing Relieving factors: none  PRECAUTIONS: None  RED FLAGS: None   WEIGHT BEARING RESTRICTIONS: No  FALLS:  Has patient fallen in last 6 months? No  LIVING ENVIRONMENT: Lives with: lives with their family Lives in: House/apartment Stairs: 9 stairs in and out of house - handrails each side Has following equipment at home: None  OCCUPATION: Not currently working  PLOF: Independent  PATIENT GOALS: pt would like to improve his comfort with walking and get back to being able to do this for 4 miles   NEXT MD VISIT: PRN  OBJECTIVE:  Note: Objective measures were completed at Evaluation unless otherwise noted.  DIAGNOSTIC FINDINGS: N/A  PATIENT SURVEYS:  LEFS: 35/80  COGNITION: Overall cognitive status: Within functional limits for tasks assessed     SENSATION: Light touch: Impaired - R anterior LE  POSTURE: rounded shoulders, forward head, and flexed trunk   PALPATION: No overt TTP noted  LOWER EXTREMITY MMT:  MMT Right eval Left eval  Hip flexion 3+ 3+  Hip extension    Hip abduction 3+ 3+  Hip adduction 4 4  Hip internal rotation    Hip external rotation    Knee flexion 3+ 4  Knee extension 4 4  Ankle dorsiflexion 2+ 4  Ankle plantarflexion    Ankle inversion    Ankle eversion     (Blank rows = not tested)  LOWER EXTREMITY SPECIAL TESTS:  DNT  FUNCTIONAL TESTS:  30 Second Sit to Stand: 8 reps no UE  GAIT: Distance walked: 42ft Assistive device utilized: None Level of assistance: Complete Independence Comments: flexed trunk   TREATMENT: [facility] Adult PT Treatment:                                                DATE: 10/17/2023 NuStep lvl  5 UE/LE x 3 min for functional activity tolerance STS 2x10 no UE Supine SLR 2x10 each 2# S/L hip abd x 10 each 2# S/L clamshell 2x15 RTB Bridge x 10 STS x 5  [facility] Adult PT Treatment:                                                DATE: 10/04/2023 NuStep lvl 5 UE/LE x 3 min for functional activity tolerance STS 2x10 no UE Supine SLR 2x10 each 2# S/L hip abd x 10 each 2# S/L clamshell 2x15 RTB Bridge x 10 STS x 5  [facility] Adult PT Treatment:  DATE: 09/20/2023 Therapeutic Exercise: Supine QS x 5 - 5" hold Supine SLR x 5 each S/L hip abd x 5 each Bridge x 10 STS x 5  PATIENT EDUCATION:  Education details: continue HEP Person educated: Patient Education method: Explanation, Demonstration, and Handouts Education comprehension: verbalized understanding and returned demonstration  HOME EXERCISE PROGRAM: Access Code: [email or web address] URL: [email or web address] Date: 09/20/2023 Prepared by: Loral Roch  Exercises - Supine Quadricep Sets  - 1 x daily - 7 x weekly - 2 sets - 10 reps - 5 sec hold - Active Straight Leg Raise with Quad Set  - 1 x daily - 7 x weekly - 3 sets - 10 reps - Sidelying Hip Abduction  - 1 x daily - 7 x weekly - 3 sets - 10 reps - Supine Bridge  - 1 x daily - 7 x weekly - 3 sets - 10 reps - Sit to Stand Without Arm Support  - 1 x daily - 7 x weekly - 3 sets - 10 reps  ASSESSMENT:  CLINICAL IMPRESSION: ***  EVAL: Patient is a 53 y.o. M who was seen today for physical therapy evaluation and treatment for chronic weakness/deconditioning and R LE pain. Physical findings are consistent with referring provider impression as pt demonstrates LE weakness and decrease in functional mobility. LEFS score shows severe disability in performance of home ADLs and community activities. Pt would benefit from skilled PT working on improving strength and slowly ramping activity tolerance.    OBJECTIVE IMPAIRMENTS: Abnormal gait,  decreased activity tolerance, decreased balance, decreased mobility, difficulty walking, decreased ROM, decreased strength, and pain   ACTIVITY LIMITATIONS: carrying, lifting, sitting, standing, squatting, stairs, transfers, and locomotion level  PARTICIPATION LIMITATIONS: meal prep, cleaning, driving, shopping, community activity, and yard work  PERSONAL FACTORS: Time since onset of injury/illness/exacerbation and 3+ comorbidities: WPW, HTN, Drug/ETOH abuse, Depression, Bipolar Disorder are also affecting patient's functional outcome.   REHAB POTENTIAL: Good  CLINICAL DECISION MAKING: Evolving/moderate complexity  EVALUATION COMPLEXITY: Moderate   GOALS: Goals reviewed with patient? No  SHORT TERM GOALS: Target date: 10/12/2023   Pt will be compliant and knowledgeable with initial HEP for improved comfort and carryover Baseline: initial HEP given  Goal status: INITIAL  2.  Pt will self report R LE pain no greater than 7/10 for improved comfort and functional ability Baseline: 10/10 at worst Goal status: INITIAL   LONG TERM GOALS: Target date: 11/16/2023   Pt will improve LEFS to no less than 48/80 as proxy for functional improvement with home ADLs and higher level community activity Baseline: 35/80 Goal status: INITIAL   2.  Pt will self report R LE pain no greater than 4/10 for improved comfort and functional ability Baseline: 10/10 at worst Goal status: INITIAL   3.  Pt will increase 30 Second Sit to Stand rep count to no less than 10 reps for improved balance, strength, and functional mobility Baseline: 8 reps  Goal status: INITIAL   4.  Pt will improve all LE MMT to at least 4/5 for improved functional mobility and conditioning  Baseline: see MMT chart Goal status: INITIAL  5.  Pt will be able to ambulate for >1 hours for improved functional mobility in community and getting back to PLOF  Baseline: 10 minutes Goal status: INITIAL   PLAN:  PT FREQUENCY:  1x/week  PT DURATION: 8 weeks  PLANNED INTERVENTIONS: [id number]- PT Re-evaluation, [id number]-Therapeutic exercises, 97530- Therapeutic activity, [id number]- Neuromuscular re-education, 97535- Self Care, [id number]- Manual therapy, [id number]- Gait training, [id number]-  Electrical stimulation (unattended), [id number]- Electrical stimulation (manual), [id number]- Vasopneumatic device, Dry Needling, Cryotherapy, and Moist heat  PLAN FOR NEXT SESSION: assess HEP response, global strengthening and conditioning   For all possible CPT codes, reference the Planned Interventions line above.     Check all conditions that are expected to impact treatment: {Conditions expected to impact treatment:Musculoskeletal disorders   If treatment provided at initial evaluation, no treatment charged due to lack of authorization.       Ivor Mars, PT 10/17/2023, 12:12 PM

## 2023-10-17 NOTE — Discharge Instructions (Addendum)
 It was a pleasure taking care of you here today.  Please make sure to follow-up with your pain management specialist.  I have refilled your home medications.  Return for new or worsening symptoms

## 2023-10-17 NOTE — ED Provider Notes (Signed)
 [facility] Provider Note   CSN: [id number] Arrival date & time: 10/17/23  1545    History  Chief Complaint  Patient presents with   Chest Pain   Abdominal Pain    [name] is a 53 y.o. male here for evaluation of abdominal pain.  He has chronic pancreatitis and has recurrent bouts of acute pain. Over the last few days he has had persistent NBNB emesis as well as loose stool.  He does have pain to his epigastric region which he feels consistent with his prior pancreatitis flares however also has some left side abdominal pain and loose stool without any blood.  He does drink alcohol  almost daily.  Last use 3 days ago. States he was taking oxycodone  from his pain from a prior ED visit however has been unable to keep this down and now he is out of his medication.  States occasionally he will get a burning sensation to the lower portion of his chest after his emesis.  He denies any overt chest pain, shortness of breath.  No fever.  No pain or swelling to lower extremities.  Denies any prior history of known aneurysm or dissections.  Does use tobacco. Has had multiple CT scans most wo acute pathology. He tells me today that he feels he "needs a CT scan to make sure."  Taking Carafate , Creon , as needed oxycodone   Saw pain management on 10/05/23 with [facility] Visit was concern for opiate dependence in combination with his alcohol  dependency.  He is post to see another pain clinic for a second opinion.  Sounds like he has exhausted multiple pain management offices according to pain management note.  They offered him Suboxone however he declined.  HPI     Home Medications Prior to Admission medications   Medication Sig Start Date End Date Taking? Authorizing Provider  acetaminophen  (TYLENOL ) 500 MG tablet Take 1,000 mg by mouth daily as needed for mild pain (pain score 1-3) or moderate pain (pain score 4-6).    [provider]   calcium -vitamin D  (OSCAL WITH D) 500-5 MG-MCG tablet Take 1 tablet by mouth daily with breakfast. 09/18/23   Ninetta Basket, MD  dicyclomine  (BENTYL ) 20 MG tablet Take 1 tablet (20 mg total) by mouth daily as needed for spasms. Patient not taking: Reported on 08/26/2023 08/21/23   Burton Casey, MD  famotidine  (PEPCID ) 40 MG tablet Take 1 tablet (40 mg total) by mouth daily. 10/17/23   Lamija Besse A, PA-C  fluticasone  (FLONASE ) 50 MCG/ACT nasal spray Place 2 sprays into both nostrils daily as needed for allergies.    [provider]  folic acid  (FOLVITE ) 1 MG tablet Take 1 tablet (1 mg total) by mouth daily. Patient not taking: Reported on 09/18/2023 08/21/23   Burton Casey, MD  lidocaine  (XYLOCAINE ) 2 % solution Use as directed 15 mLs in the mouth or throat every 12 (twelve) hours as needed. 10/17/23   Phinneas Shakoor A, PA-C  lipase/protease/amylase (CREON ) 36000 UNITS CPEP capsule Take 2 capsules (72,000 Units total) by mouth 3 (three) times daily with meals. May also take 1 capsule (36,000 Units total) as needed (with snacks). 08/21/23   Ghimire, Estil Heman, MD  loperamide  (IMODIUM ) 2 MG capsule Take 2 capsules (4 mg total) by mouth as needed for diarrhea or loose stools. 09/25/23   Mesner, Reymundo Caulk, MD  magnesium  oxide (MAG-OX) 400 (240 Mg) MG tablet Take 1 tablet (400 mg total) by mouth  daily. Patient taking differently: Take 400 mg by mouth 2 (two) times daily. 08/21/23   Ghimire, Estil Heman, MD  metoprolol  (TOPROL -XL) 200 MG 24 hr tablet Take 1 tablet (200 mg total) by mouth daily. 08/21/23   Ghimire, Estil Heman, MD  Multiple Vitamin (MULTIVITAMIN WITH MINERALS) TABS tablet Take 1 tablet by mouth daily. 08/21/23   Ghimire, Estil Heman, MD  ondansetron  (ZOFRAN ) 4 MG tablet Take 1 tablet (4 mg total) by mouth every 6 (six) hours. 10/17/23   Maximum Reiland A, PA-C  ondansetron  (ZOFRAN -ODT) 4 MG disintegrating tablet Take 1 tablet (4 mg total) by mouth daily as needed for nausea or  vomiting. 08/21/23   Ghimire, Estil Heman, MD  oxyCODONE  (ROXICODONE ) 5 MG immediate release tablet Take 1 tablet (5 mg total) by mouth every 6 (six) hours as needed. 10/17/23   Jahniyah Revere A, PA-C  pantoprazole  (PROTONIX ) 20 MG tablet Take 1 tablet (20 mg total) by mouth daily. 10/13/23   Auston Blush, MD  psyllium (HYDROCIL/METAMUCIL) 95 % PACK Take 1 packet by mouth daily. Patient not taking: Reported on 09/18/2023 08/21/23   Burton Casey, MD  sodium zirconium cyclosilicate  (LOKELMA ) 10 g PACK packet Take 10 g by mouth 2 (two) times daily. Patient not taking: Reported on 08/26/2023 08/21/23   Burton Casey, MD  sucralfate  (CARAFATE ) 1 g tablet Take 1 tablet (1 g total) by mouth in the morning, at noon, and at bedtime. 09/02/23   Marius Siemens, NP  tamsulosin  (FLOMAX ) 0.4 MG CAPS capsule Take 1 capsule (0.4 mg total) by mouth daily. 08/21/23   Ghimire, Estil Heman, MD  thiamine  (VITAMIN B1) 100 MG tablet Take 1 tablet (100 mg total) by mouth daily. 08/21/23   Ghimire, Estil Heman, MD  amitriptyline  (ELAVIL ) 25 MG tablet Take 1 tablet (25 mg total) by mouth at bedtime. Patient not taking: Reported on 08/08/2019 10/15/18 08/08/19  Kraig Peru, MD      Allergies    Robaxin  [methocarbamol ], Aspirin , Shellfish-derived products, Trazodone  and nefazodone, Adhesive [tape], Fish-derived products, Latex, Toradol  [ketorolac  tromethamine ], Contrast media [iodinated contrast media], Reglan  [metoclopramide ], and Clent Czar oil]    Review of Systems   Review of Systems  Constitutional:  Positive for appetite change and fatigue. Negative for diaphoresis and fever.  HENT: Negative.    Respiratory: Negative.    Cardiovascular: Negative.   Gastrointestinal:  Positive for abdominal pain, diarrhea, nausea and vomiting. Negative for abdominal distention, anal bleeding, blood in stool, constipation and rectal pain.  Genitourinary: Negative.   Musculoskeletal: Negative.   Skin: Negative.   Neurological:  Negative.   All other systems reviewed and are negative.   Physical Exam Updated Vital Signs BP (!) 183/106   Pulse 71   Temp 98.2 F (36.8 C) (Oral)   Resp 17   SpO2 98%  Physical Exam Vitals and nursing note reviewed.  Constitutional:      General: He is not in acute distress.    Appearance: He is well-developed. He is not ill-appearing, toxic-appearing or diaphoretic.  HENT:     Head: Normocephalic and atraumatic.  Eyes:     Pupils: Pupils are equal, round, and reactive to light.  Cardiovascular:     Rate and Rhythm: Normal rate and regular rhythm.     Pulses:          Radial pulses are 2+ on the right side and 2+ on the left side.       Dorsalis pedis pulses are 2+ on the  right side and 2+ on the left side.     Heart sounds: Normal heart sounds.  Pulmonary:     Effort: Pulmonary effort is normal. No respiratory distress.     Breath sounds: Normal breath sounds.     Comments: Clear bilaterally, speaks in full sentences without difficulty Chest:     Comments: Nontender chest wall Abdominal:     General: Bowel sounds are normal. There is no distension or abdominal bruit.     Palpations: Abdomen is soft. There is no fluid wave, hepatomegaly or mass.     Tenderness: There is abdominal tenderness. There is no guarding or rebound.     Comments: Tenderness epigastric region and left upper and mid abdomen without rebound or guarding.  No fluid wave.  No overlying skin changes.  Musculoskeletal:        General: Normal range of motion.     Cervical back: Normal range of motion and neck supple.     Right lower leg: No tenderness. No edema.     Left lower leg: No tenderness. No edema.     Comments: Full range of motion, compartment soft, no lower extremity edema.  Calf soft, nontender bilaterally  Skin:    General: Skin is warm and dry.     Capillary Refill: Capillary refill takes less than 2 seconds.     Comments: No obvious rashes or lesions on exposed skin  Neurological:      General: No focal deficit present.     Mental Status: He is alert and oriented to person, place, and time.     ED Results / Procedures / Treatments   Labs (all labs ordered are listed, but only abnormal results are displayed) Labs Reviewed  CBC WITH DIFFERENTIAL/PLATELET - Abnormal; Notable for the following components:      Result Value   RBC 4.16 (*)    Hemoglobin 12.3 (*)    HCT 38.3 (*)    RDW 20.0 (*)    All other components within normal limits  COMPREHENSIVE METABOLIC PANEL WITH GFR - Abnormal; Notable for the following components:   CO2 18 (*)    Calcium  8.6 (*)    AST 55 (*)    All other components within normal limits  MAGNESIUM  - Abnormal; Notable for the following components:   Magnesium  1.0 (*)    All other components within normal limits  LIPASE, BLOOD  TROPONIN I (HIGH SENSITIVITY)    EKG None  Radiology CT ABDOMEN PELVIS WO CONTRAST Result Date: 10/17/2023 CLINICAL DATA:  Abdominal pain left lower quadrant pain epigastric pain EXAM: CT ABDOMEN AND PELVIS WITHOUT CONTRAST TECHNIQUE: Multidetector CT imaging of the abdomen and pelvis was performed following the standard protocol without IV contrast. RADIATION DOSE REDUCTION: This exam was performed according to the departmental dose-optimization program which includes automated exposure control, adjustment of the mA and/or kV according to patient size and/or use of iterative reconstruction technique. COMPARISON:  CT abdomen August 13, 2023 FINDINGS: Lower chest: No infiltrates or consolidations, no pleural effusions Hepatobiliary: Liver normal size no masses no biliary dilatation. Gallbladder unremarkable. No gallstones. Pancreas: Extensive pancreatic calcification correlate with chronic pancreatitis. Comparison with prior examinations demonstrates no change in the cystic structure within the body tail pancreas region that measures 3.9 x 3.2 cm and correlates with pancreatic pseudocyst. No inflammatory changes to  indicate acute associated pancreatitis. Spleen: Spleen normal size.  No masses. Adrenals/Urinary Tract: Adrenal glands are normal size. Follow-up recommended. Kidneys are normal. No masses calcifications or  hydronephrosis Stomach/Bowel: No small or large bowel obstruction or inflammatory changes. Moderate amount of residual fecal material throughout the colon without obstruction or constipation. Vascular/Lymphatic: No significant vascular findings are present. No enlarged abdominal or pelvic lymph nodes. Reproductive: Prostate is unremarkable. Other: Anterior abdominal wall unremarkable without evidence of umbilical or inguinal hernias Musculoskeletal: Visualized portion of the thoracolumbar spine and pelvic structures grossly unremarkable without evidence of fracture bony abnormalities or soft tissue masses. IMPRESSION: *No acute findings in the abdomen or pelvis. *Chronic pancreatitis with stable pancreatic pseudocyst. *Moderate amount of residual fecal material throughout the colon without obstruction or constipation. Electronically Signed   By: Fredrich Jefferson M.D.   On: 10/17/2023 16:58   DG Chest 2 View Result Date: 10/17/2023 CLINICAL DATA:  Chest pain. EXAM: CHEST - 2 VIEW COMPARISON:  October 13, 2023. FINDINGS: The heart size and mediastinal contours are within normal limits. Both lungs are clear. The visualized skeletal structures are unremarkable. IMPRESSION: No active cardiopulmonary disease. Electronically Signed   By: Rosalene Colon M.D.   On: 10/17/2023 16:38    Procedures Procedures    Medications Ordered in ED Medications  sodium chloride  0.9 % bolus 1,000 mL (0 mLs Intravenous Stopped 10/17/23 1903)  ondansetron  (ZOFRAN ) injection 4 mg (4 mg Intravenous Given 10/17/23 1655)  HYDROmorphone  (DILAUDID ) injection 0.5 mg (0.5 mg Intravenous Given 10/17/23 1656)  magnesium  sulfate IVPB 2 g 50 mL (0 g Intravenous Stopped 10/17/23 1845)  alum & mag hydroxide-simeth (MAALOX/MYLANTA) 200-200-20  MG/5ML suspension 30 mL (30 mLs Oral Given 10/17/23 1843)    And  lidocaine  (XYLOCAINE ) 2 % viscous mouth solution 15 mL (15 mLs Oral Given 10/17/23 1843)  HYDROmorphone  (DILAUDID ) injection 0.5 mg (0.5 mg Intravenous Given 10/17/23 1843)    ED Course/ Medical Decision Making/ A&P    53 year old known chronic abdominal pain due to chronic pancreatitis secondary to chronic EtOH use here for evaluation nausea, vomiting and abdominal pain as well as loose stool.  States he was recently seen by pain management however referred for second opinion.  He has been able to keep down his medicines.  No blood in emesis or stool.  Pain epigastric region as well as left abdomen.  No recent injury or trauma.  Last EtOH drink 3 days ago.  He does not appear to withdrawing from alcohol  at this time.  No tremors.  Does have a CIWA of 2 due to nausea and vomiting.  Low suspicion for acute alcohol  withdrawal.  Patient does states that he feels he needs a CT scan as he wants to make sure that nothing is changed with his pancreas.  Will plan on labs, imaging and reassess.  Labs and imaging personally viewed and interpreted:  CBC without leukocytosis, hemoglobin 12.3 Magnesium  1.0, supplemented here Metabolic panel AST 55, similar to prior Lipase 27 Troponin 5 EKG without ischemic changes Chest x-ray without acute abnormality CT on pelvis with chronic pancreatitis without acute changes as well as some constipation  Patient reassessed.  Requesting additional pain management.  He is tolerating p.o. intake here.  I suspect this is likely an acute exacerbation of his chronic pain likely due to continued EtOH use.  He is requesting refill some of his medications.  I urged him to follow back up with a pain management specialist however ultimately had long discussion with the patient for sensation of alcohol .  He states he is not ready to completely quit.  Will have him follow-up outpatient, return for any worsening  symptoms  Patient is nontoxic, nonseptic appearing, in no apparent distress.  Patient's pain and other symptoms adequately managed in emergency department.  Fluid bolus given.  Labs, imaging and vitals reviewed.  Patient does not meet the SIRS or Sepsis criteria.  On repeat exam patient does not have a surgical abdomin and there are no peritoneal signs.  No indication of appendicitis, bowel obstruction, bowel perforation, cholecystitis, GI bleed, diverticulitis, AAA, dissection, pneumothorax, PE, unstable angina.  Patient discharged home with symptomatic treatment and given strict instructions for follow-up with their primary care physician.  I have also discussed reasons to return immediately to the ER.  Patient expresses understanding and agrees with plan.                                  Medical Decision Making Amount and/or Complexity of Data Reviewed External Data Reviewed: labs, radiology, ECG and notes. Labs: ordered. Decision-making details documented in ED Course. Radiology: ordered and independent interpretation performed. Decision-making details documented in ED Course. ECG/medicine tests: ordered and independent interpretation performed. Decision-making details documented in ED Course.  Risk OTC drugs. Prescription drug management. Parenteral controlled substances. Decision regarding hospitalization. Diagnosis or treatment significantly limited by social determinants of health.           Final Clinical Impression(s) / ED Diagnoses Final diagnoses:  Alcohol -induced chronic pancreatitis (HCC)  Nausea vomiting and diarrhea  Hypomagnesemia    Rx / DC Orders ED Discharge Orders          Ordered    famotidine  (PEPCID ) 40 MG tablet  Daily        10/17/23 1934    ondansetron  (ZOFRAN ) 4 MG tablet  Every 6 hours        10/17/23 1934    oxyCODONE  (ROXICODONE ) 5 MG immediate release tablet  Every 6 hours PRN        10/17/23 1934    lidocaine  (XYLOCAINE ) 2 % solution   Every 12 hours PRN        10/17/23 1934              Shaylene Paganelli A, PA-C 10/17/23 1942    Mozell Arias, MD 10/17/23 2243

## 2023-10-17 NOTE — ED Triage Notes (Signed)
 Chest and abd pain, pain goes down left arm.

## 2023-10-17 NOTE — ED Notes (Signed)
 Patient discharged in stable condition, education materials explained including, follow up, any prescriptions and reasons to return. Patient voiced agreement to education and discharge material.

## 2023-10-19 ENCOUNTER — Emergency Department (HOSPITAL_COMMUNITY): Payer: MEDICAID

## 2023-10-19 ENCOUNTER — Emergency Department (HOSPITAL_COMMUNITY)
Admission: EM | Admit: 2023-10-19 | Discharge: 2023-10-19 | Disposition: A | Discharge status: Home / Self Care | Payer: MEDICAID | Attending: Emergency Medicine | Admitting: Emergency Medicine

## 2023-10-19 DIAGNOSIS — Z79899 Other long term (current) drug therapy: Secondary | ICD-10-CM | POA: Insufficient documentation

## 2023-10-19 DIAGNOSIS — J45909 Unspecified asthma, uncomplicated: Secondary | ICD-10-CM | POA: Insufficient documentation

## 2023-10-19 DIAGNOSIS — I10 Essential (primary) hypertension: Secondary | ICD-10-CM | POA: Insufficient documentation

## 2023-10-19 DIAGNOSIS — G8929 Other chronic pain: Secondary | ICD-10-CM | POA: Diagnosis not present

## 2023-10-19 DIAGNOSIS — R10819 Abdominal tenderness, unspecified site: Secondary | ICD-10-CM | POA: Diagnosis present

## 2023-10-19 DIAGNOSIS — Z91040 Latex allergy status: Secondary | ICD-10-CM | POA: Diagnosis not present

## 2023-10-19 LAB — URINALYSIS, ROUTINE W REFLEX MICROSCOPIC
Bilirubin Urine: NEGATIVE
Glucose, UA: NEGATIVE mg/dL
Hgb urine dipstick: NEGATIVE
Ketones, ur: 5 mg/dL — AB
Leukocytes,Ua: NEGATIVE
Nitrite: NEGATIVE
Protein, ur: NEGATIVE mg/dL
Specific Gravity, Urine: 1.014 (ref 1.005–1.030)
pH: 5 (ref 5.0–8.0)

## 2023-10-19 LAB — LIPASE, BLOOD: Lipase: 25 U/L (ref 11–51)

## 2023-10-19 LAB — COMPREHENSIVE METABOLIC PANEL WITH GFR
ALT: 34 U/L (ref 0–44)
AST: 65 U/L — ABNORMAL HIGH (ref 15–41)
Albumin: 4.3 g/dL (ref 3.5–5.0)
Alkaline Phosphatase: 79 U/L (ref 38–126)
Anion gap: 13 (ref 5–15)
BUN: 13 mg/dL (ref 6–20)
CO2: 17 mmol/L — ABNORMAL LOW (ref 22–32)
Calcium: 9.2 mg/dL (ref 8.9–10.3)
Chloride: 109 mmol/L (ref 98–111)
Creatinine, Ser: 1.8 mg/dL — ABNORMAL HIGH (ref 0.61–1.24)
GFR, Estimated: 44 mL/min — ABNORMAL LOW (ref >60)
Glucose, Bld: 75 mg/dL (ref 70–99)
Potassium: 4.9 mmol/L (ref 3.5–5.1)
Sodium: 139 mmol/L (ref 135–145)
Total Bilirubin: 1 mg/dL (ref 0.0–1.2)
Total Protein: 7.7 g/dL (ref 6.5–8.1)

## 2023-10-19 LAB — CBC
HCT: 36.9 % — ABNORMAL LOW (ref 39.0–52.0)
Hemoglobin: 11.9 g/dL — ABNORMAL LOW (ref 13.0–17.0)
MCH: 29.7 pg (ref 26.0–34.0)
MCHC: 32.2 g/dL (ref 30.0–36.0)
MCV: 92 fL (ref 80.0–100.0)
Platelets: 209 10*3/uL (ref 150–400)
RBC: 4.01 MIL/uL — ABNORMAL LOW (ref 4.22–5.81)
RDW: 19.9 % — ABNORMAL HIGH (ref 11.5–15.5)
WBC: 5.9 10*3/uL (ref 4.0–10.5)
nRBC: 0 % (ref 0.0–0.2)

## 2023-10-19 LAB — TROPONIN I (HIGH SENSITIVITY)
Troponin I (High Sensitivity): 7 ng/L (ref <18)
Troponin I (High Sensitivity): 7 ng/L (ref <18)

## 2023-10-19 LAB — ETHANOL: Alcohol, Ethyl (B): <15 mg/dL (ref <15)

## 2023-10-19 MED ORDER — SODIUM CHLORIDE 0.9 % IV BOLUS
1000.0000 mL | Freq: Once | INTRAVENOUS | Status: AC
  Administered 2023-10-19: 1000 mL via INTRAVENOUS

## 2023-10-19 MED ORDER — OXYCODONE HCL 5 MG PO TABS: 5.0000 mg | ORAL_TABLET | Freq: Once | ORAL | Status: DC

## 2023-10-19 MED ORDER — ONDANSETRON HCL 4 MG/2ML IJ SOLN
4.0000 mg | Freq: Once | INTRAMUSCULAR | Status: AC
  Administered 2023-10-19: 4 mg via INTRAVENOUS
  Filled 2023-10-19: qty 2

## 2023-10-19 MED ORDER — SUCRALFATE 1 G PO TABS
1.0000 g | ORAL_TABLET | Freq: Once | ORAL | Status: AC
  Administered 2023-10-19: 1 g via ORAL
  Filled 2023-10-19: qty 1

## 2023-10-19 NOTE — ED Triage Notes (Signed)
 Pt was BIB [facility] from home d/t concern for abdominal pain and chest pain ongoing for the past 12 hours. Sts that about 40 min ago pt had sudden worsening centralized chest pain that radiates to the left arm. Reports abdominal pain similar to his pancreatic flare ups in the past. Reports last drink 4 days ago. EMS Vs 186/114 and 88 NSR.

## 2023-10-19 NOTE — ED Notes (Signed)
 Pt ripped out IV in front of this RN and the whole nursing pod.  Pt becoming irate and irritated due to not getting the pain medicine he wanted.  MD Inga Manges notified and aware.

## 2023-10-19 NOTE — Discharge Instructions (Signed)
 Follow up with your doctor in the office.  Follow up with your pain clinic.

## 2023-10-19 NOTE — ED Provider Notes (Signed)
  EMERGENCY DEPARTMENT AT [facility] Provider Note   CSN: [id number] Arrival date & time: 10/19/23  9528     History  Chief Complaint  Patient presents with   Chest Pain   Abdominal Pain    [name] is a 53 y.o. male.  HPI     This is a 53 year old male with history of alcohol  induced pancreatitis, chronic abdominal pain who presents with abdominal pain, dizziness, fall, chest pain.  Patient reports worsening chest and abdominal pain over the last 12 hours.  States that it feels similar to his pancreatic flareups.  Reports that he is taking his Creon , Protonix , and the provided oxycodone  with minimal relief.  Reports that he has 1 oxycodone  tablet left.  Reports diarrhea.  No fevers.  Patient denies any recent upper respiratory symptoms.  Of note, he reports that he tripped and fell and hit the right side of his face.  He is unsure whether he lost consciousness.  Reports his last alcohol  use was Tuesday.  Does report some cocaine use.  He was seen and evaluated last on the 10th.  At that time his CT abdomen pelvis showed chronic pancreatitis with a stable pseudocyst.  Home Medications Prior to Admission medications   Medication Sig Start Date End Date Taking? Authorizing Provider  acetaminophen  (TYLENOL ) 500 MG tablet Take 1,000 mg by mouth daily as needed for mild pain (pain score 1-3) or moderate pain (pain score 4-6).    [provider]  calcium -vitamin D  (OSCAL WITH D) 500-5 MG-MCG tablet Take 1 tablet by mouth daily with breakfast. 09/18/23   Ninetta Basket, MD  dicyclomine  (BENTYL ) 20 MG tablet Take 1 tablet (20 mg total) by mouth daily as needed for spasms. Patient not taking: Reported on 08/26/2023 08/21/23   Burton Casey, MD  famotidine  (PEPCID ) 40 MG tablet Take 1 tablet (40 mg total) by mouth daily. 10/17/23   Henderly, Britni A, PA-C  fluticasone  (FLONASE ) 50 MCG/ACT nasal spray Place 2 sprays into both nostrils daily as  needed for allergies.    [provider]  folic acid  (FOLVITE ) 1 MG tablet Take 1 tablet (1 mg total) by mouth daily. Patient not taking: Reported on 09/18/2023 08/21/23   Burton Casey, MD  lidocaine  (XYLOCAINE ) 2 % solution Use as directed 15 mLs in the mouth or throat every 12 (twelve) hours as needed. 10/17/23   Henderly, Britni A, PA-C  lipase/protease/amylase (CREON ) 36000 UNITS CPEP capsule Take 2 capsules (72,000 Units total) by mouth 3 (three) times daily with meals. May also take 1 capsule (36,000 Units total) as needed (with snacks). 08/21/23   Ghimire, Estil Heman, MD  loperamide  (IMODIUM ) 2 MG capsule Take 2 capsules (4 mg total) by mouth as needed for diarrhea or loose stools. 09/25/23   Mesner, Reymundo Caulk, MD  magnesium  oxide (MAG-OX) 400 (240 Mg) MG tablet Take 1 tablet (400 mg total) by mouth daily. Patient taking differently: Take 400 mg by mouth 2 (two) times daily. 08/21/23   Ghimire, Estil Heman, MD  metoprolol  (TOPROL -XL) 200 MG 24 hr tablet Take 1 tablet (200 mg total) by mouth daily. 08/21/23   Ghimire, Estil Heman, MD  Multiple Vitamin (MULTIVITAMIN WITH MINERALS) TABS tablet Take 1 tablet by mouth daily. 08/21/23   Ghimire, Estil Heman, MD  ondansetron  (ZOFRAN ) 4 MG tablet Take 1 tablet (4 mg total) by mouth every 6 (six) hours. 10/17/23   Henderly, Britni A, PA-C  ondansetron  (ZOFRAN -ODT) 4 MG disintegrating tablet  Take 1 tablet (4 mg total) by mouth daily as needed for nausea or vomiting. 08/21/23   Ghimire, Estil Heman, MD  oxyCODONE  (ROXICODONE ) 5 MG immediate release tablet Take 1 tablet (5 mg total) by mouth every 6 (six) hours as needed. 10/17/23   Henderly, Britni A, PA-C  pantoprazole  (PROTONIX ) 20 MG tablet Take 1 tablet (20 mg total) by mouth daily. 10/13/23   Auston Blush, MD  psyllium (HYDROCIL/METAMUCIL) 95 % PACK Take 1 packet by mouth daily. Patient not taking: Reported on 09/18/2023 08/21/23   Burton Casey, MD  sodium zirconium cyclosilicate  (LOKELMA ) 10 g PACK  packet Take 10 g by mouth 2 (two) times daily. Patient not taking: Reported on 08/26/2023 08/21/23   Burton Casey, MD  sucralfate  (CARAFATE ) 1 g tablet Take 1 tablet (1 g total) by mouth in the morning, at noon, and at bedtime. 09/02/23   Marius Siemens, NP  tamsulosin  (FLOMAX ) 0.4 MG CAPS capsule Take 1 capsule (0.4 mg total) by mouth daily. 08/21/23   Ghimire, Estil Heman, MD  thiamine  (VITAMIN B1) 100 MG tablet Take 1 tablet (100 mg total) by mouth daily. 08/21/23   Ghimire, Estil Heman, MD  amitriptyline  (ELAVIL ) 25 MG tablet Take 1 tablet (25 mg total) by mouth at bedtime. Patient not taking: Reported on 08/08/2019 10/15/18 08/08/19  Kraig Peru, MD      Allergies    Robaxin  [methocarbamol ], Aspirin , Shellfish-derived products, Trazodone  and nefazodone, Adhesive [tape], Fish-derived products, Latex, Toradol  [ketorolac  tromethamine ], Contrast media [iodinated contrast media], Reglan  [metoclopramide ], and Clent Czar oil]    Review of Systems   Review of Systems  Constitutional: Negative.  Negative for fever.  Respiratory: Negative.  Negative for chest tightness and shortness of breath.   Cardiovascular:  Positive for chest pain.  Gastrointestinal:  Positive for abdominal pain.  Genitourinary: Negative.  Negative for dysuria.  Musculoskeletal:  Negative for back pain.  Skin:  Negative for rash.  Neurological:  Positive for dizziness and headaches.  All other systems reviewed and are negative.   Physical Exam Updated Vital Signs BP (!) 158/104 (BP Location: Right Arm)   Pulse 81   Temp 98.8 F (37.1 C)   Resp 16   SpO2 100%  Physical Exam Vitals and nursing note reviewed.  Constitutional:      Appearance: He is well-developed. He is not toxic-appearing.  HENT:     Head: Normocephalic and atraumatic.   Eyes:     Pupils: Pupils are equal, round, and reactive to light.    Cardiovascular:     Rate and Rhythm: Normal rate and regular rhythm.     Heart sounds: Normal heart  sounds. No murmur heard. Pulmonary:     Effort: Pulmonary effort is normal. No respiratory distress.     Breath sounds: Normal breath sounds. No wheezing.  Abdominal:     General: Bowel sounds are normal.     Palpations: Abdomen is soft.     Tenderness: There is abdominal tenderness. There is no guarding or rebound.     Comments: Diffuse tenderness to palpation, no rebound or guarding   Musculoskeletal:     Cervical back: Neck supple.  Lymphadenopathy:     Cervical: No cervical adenopathy.   Skin:    General: Skin is warm and dry.   Neurological:     Mental Status: He is alert and oriented to person, place, and time.   Psychiatric:        Mood and Affect: Mood normal.  ED Results / Procedures / Treatments   Labs (all labs ordered are listed, but only abnormal results are displayed) Labs Reviewed  CBC - Abnormal; Notable for the following components:      Result Value   RBC 4.01 (*)    Hemoglobin 11.9 (*)    HCT 36.9 (*)    RDW 19.9 (*)    All other components within normal limits  LIPASE, BLOOD  COMPREHENSIVE METABOLIC PANEL WITH GFR  URINALYSIS, ROUTINE W REFLEX MICROSCOPIC  ETHANOL  TROPONIN I (HIGH SENSITIVITY)    EKG EKG Interpretation Date/Time:  Thursday October 19 2023 06:23:07 EDT Ventricular Rate:  82 PR Interval:  106 QRS Duration:  76 QT Interval:  358 QTC Calculation: 418 R Axis:   68  Text Interpretation: Sinus rhythm with short PR Biatrial enlargement Abnormal ECG When compared with ECG of 17-Oct-2023 15:46, PREVIOUS ECG IS PRESENT Confirmed by Donita Furrow ([id number]) on 10/19/2023 6:38:19 AM  Radiology CT ABDOMEN PELVIS WO CONTRAST Result Date: 10/17/2023 CLINICAL DATA:  Abdominal pain left lower quadrant pain epigastric pain EXAM: CT ABDOMEN AND PELVIS WITHOUT CONTRAST TECHNIQUE: Multidetector CT imaging of the abdomen and pelvis was performed following the standard protocol without IV contrast. RADIATION DOSE REDUCTION: This exam was  performed according to the departmental dose-optimization program which includes automated exposure control, adjustment of the mA and/or kV according to patient size and/or use of iterative reconstruction technique. COMPARISON:  CT abdomen August 13, 2023 FINDINGS: Lower chest: No infiltrates or consolidations, no pleural effusions Hepatobiliary: Liver normal size no masses no biliary dilatation. Gallbladder unremarkable. No gallstones. Pancreas: Extensive pancreatic calcification correlate with chronic pancreatitis. Comparison with prior examinations demonstrates no change in the cystic structure within the body tail pancreas region that measures 3.9 x 3.2 cm and correlates with pancreatic pseudocyst. No inflammatory changes to indicate acute associated pancreatitis. Spleen: Spleen normal size.  No masses. Adrenals/Urinary Tract: Adrenal glands are normal size. Follow-up recommended. Kidneys are normal. No masses calcifications or hydronephrosis Stomach/Bowel: No small or large bowel obstruction or inflammatory changes. Moderate amount of residual fecal material throughout the colon without obstruction or constipation. Vascular/Lymphatic: No significant vascular findings are present. No enlarged abdominal or pelvic lymph nodes. Reproductive: Prostate is unremarkable. Other: Anterior abdominal wall unremarkable without evidence of umbilical or inguinal hernias Musculoskeletal: Visualized portion of the thoracolumbar spine and pelvic structures grossly unremarkable without evidence of fracture bony abnormalities or soft tissue masses. IMPRESSION: *No acute findings in the abdomen or pelvis. *Chronic pancreatitis with stable pancreatic pseudocyst. *Moderate amount of residual fecal material throughout the colon without obstruction or constipation. Electronically Signed   By: Fredrich Jefferson M.D.   On: 10/17/2023 16:58   DG Chest 2 View Result Date: 10/17/2023 CLINICAL DATA:  Chest pain. EXAM: CHEST - 2 VIEW COMPARISON:   October 13, 2023. FINDINGS: The heart size and mediastinal contours are within normal limits. Both lungs are clear. The visualized skeletal structures are unremarkable. IMPRESSION: No active cardiopulmonary disease. Electronically Signed   By: Rosalene Colon M.D.   On: 10/17/2023 16:38    Procedures Procedures    Medications Ordered in ED Medications  sucralfate  (CARAFATE ) tablet 1 g (has no administration in time range)  ondansetron  (ZOFRAN ) injection 4 mg (has no administration in time range)  sodium chloride  0.9 % bolus 1,000 mL (has no administration in time range)    ED Course/ Medical Decision Making/ A&P  Medical Decision Making Amount and/or Complexity of Data Reviewed Labs: ordered. Radiology: ordered.  Risk Prescription drug management.   This patient presents to the ED for concern of abdominal pain, chest pain, this involves an extensive number of treatment options, and is a complaint that carries with it a high risk of complications and morbidity.  I considered the following differential and admission for this acute, potentially life threatening condition.  The differential diagnosis includes chronic pain, chronic pancreatitis, gastritis, gastroenteritis, cholecystitis, dehydration, ACS  MDM:    This is a 53 year old male who presents with multiple complaints primarily chest and abdominal pain.  History of the same.  History of chronic pancreatitis.  He is nontoxic.  Vital signs are notable for blood pressure 158/104.  He has some diffuse tenderness to palpation without signs of peritonitis.  Appears stable based on my known baseline of him.  Patient was given Carafate , Zofran , fluids.  Will get orthostatics.  Given recent fall will obtain CT head imaging.  Basic lab work pending.  EKG without acute ischemic changes.  Troponin pending.  (Labs, imaging, consults)  Labs: I Ordered, and personally interpreted labs.  The pertinent results  include: Pending  Imaging Studies ordered: I ordered imaging studies including pending I independently visualized and interpreted imaging. I agree with the radiologist interpretation  Additional history obtained from chart review.  External records from outside source obtained and reviewed including recent evaluation  Cardiac Monitoring: The patient was not maintained on a cardiac monitor.  If on the cardiac monitor, I personally viewed and interpreted the cardiac monitored which showed an underlying rhythm of: N/A  Reevaluation: After the interventions noted above, I reevaluated the patient and found that they have :stayed the same  Social Determinants of Health:  lives independently  Disposition: Pending workup, signed out to oncoming provider  Co morbidities that complicate the patient evaluation  Past Medical History:  Diagnosis Date   Alcoholism /alcohol  abuse    Anemia    Anxiety    Arthritis    knees; arms; elbows (03/26/2015)   Asthma    Bipolar disorder (HCC)    Chronic bronchitis (HCC)    Chronic lower back pain    Chronic pancreatitis (HCC)    Cocaine abuse (HCC)    Depression    Family history of adverse reaction to anesthesia    Femoral condyle fracture (HCC) 03/08/2014   left medial/notes 03/09/2014   GERD (gastroesophageal reflux disease)    H/O hiatal hernia    H/O suicide attempt 10/2012   High cholesterol    History of blood transfusion 10/2012   when I tried to commit suicide   History of stomach ulcers    Hypertension    Marijuana abuse, continuous    Migraine    a few times/year (03/26/2015)   Pancreatitis    Pneumonia 1990's X 3   PTSD (post-traumatic stress disorder)    Seizures (HCC)    Sickle cell trait (HCC)    WPW (Wolff-Parkinson-White syndrome)    Maximo Spar 03/06/2013     Medicines Meds ordered this encounter  Medications   sucralfate  (CARAFATE ) tablet 1 g   ondansetron  (ZOFRAN ) injection 4 mg   sodium chloride  0.9 % bolus 1,000  mL    I have reviewed the patients home medicines and have made adjustments as needed  Problem List / ED Course: Problem List Items Addressed This Visit   None               Final Clinical  Impression(s) / ED Diagnoses Final diagnoses:  None    Rx / DC Orders ED Discharge Orders     None         Kaymarie Wynn, Vonzella Guernsey, MD 10/19/23 [phone]

## 2023-10-19 NOTE — ED Provider Notes (Signed)
 Received patient in turnover from Dr. Carylon Claude.  Please see their note for further details of Hx, PE.  Briefly patient is a 53 y.o. male with a Chest Pain and Abdominal Pain .  Patient is well-known to this emergency department with 18 visits in the past 6 months.  Has a history of chronic pancreatitis.  Here for acute on chronic abdominal pain as well as said that he fell and struck his head.  Awaiting blood work symptomatic therapy CT of the head.  CT of the head without obvious acute intracranial hemorrhage on my independent interpretation.  Patient's initial blood work with a normal lipase LFTs unremarkable.  The patient did ask for some pain medicine.  I ordered him an oral dose.  He then asked to speak to me at bedside.  He discussed that he wanted more pain medicine.  He has follow-up with the pain management clinic on the 20th but does not feel like his pain is well-controlled until then.  I discussed with him that his pain should be managed as an outpatient.  He then became angry because I did not give him IV narcotics and he ripped out his IV.  Will discharge at this time.  PCP follow-up.    Jason Hughs, DO 10/19/23 [phone]

## 2023-10-19 NOTE — ED Provider Notes (Signed)
 Emergency Department Provider Note  Provider at bedside: 12:46 PM  History obtained from the: Patient  History   Chief Complaint  Patient presents with  . Chest Pain      History provided by:  Patient, EMS personnel and medical records   [name] is a 53 y.o. male who presents to the ED with complaints of abdominal pain.  Patient states he has a history of pancreatitis that does not normally show up on his lab work.  He was seen earlier today over at [facility].  In addition to his chest pain he also complains of lower chest pain. He does note he was seen at [facility] but requested a CT scan to be performed.  He also acknowledges he has an appointment upcoming with pain management on June 20.  Patient denies any fevers.  Patient does acknowledge continued alcohol  use.  He also acknowledges occasional cocaine marijuana use.   No LMP for male patient.   Past Medical History Medical History[1]  Past Surgical History Surgical History[2]   Medications Home Medications           * acetaminophen  (TYLENOL ) 325 mg tablet   * albuterol  HFA (PROVENTIL  HFA;VENTOLIN  HFA;PROAIR  HFA) 90 mcg/actuation inhaler   * benzonatate  (TESSALON ) 100 mg capsule   * bismuth  subsalicylate (Kaopectate, bismuth  subsalicy,) 262 mg/15 mL suspension   * cholecalciferol  (VITAMIN D3) 10 mcg (400 unit) tablet   * cyclobenzaprine  (FLEXERIL ) 10 mg tablet   * dilTIAZem (TIAZAC) 120 mg Cs24 24 hr capsule (Expired)    Take 1 capsule (120 mg total) by mouth daily.   * famotidine  (PEPCID ) 20 mg tablet (Expired)    Take 1 tablet (20 mg total) by mouth 2 (two) times a day for 15 days.   * ferrous sulfate  325 mg (65 mg iron) tablet   * fluticasone  propionate (FLONASE ) 50 mcg/spray nasal spray   * folic acid  (FOLVITE ) 1 mg tablet    Take 1 tablet (1 mg total) by mouth daily Indications: inadequate folic acid .   * lidocaine  (Lidoderm ) 5 % patch   * lidocaine  (XYLOCAINE ) 2 % viscous solution    Take  10 mL by mouth 2 (two) times a day as needed for mild pain (1-3).   * magnesium  oxide 400 mg (241 mg magnesium ) tab (Expired)    Take 1 tablet (400 mg total) by mouth 2 (two) times a day.   * metoprolol  succinate (TOPROL  XL) 200 mg 24 hr tablet (Expired)    Take 1 tablet (200 mg total) by mouth daily.   * nitroglycerin  (NITROSTAT ) 0.4 mg SL tablet    Place 1 tablet (0.4 mg total) under the tongue every 5 (five) minutes as needed for chest pain.    Patient not taking: Reported on 08/03/2023   * omeprazole  (PriLOSEC) 40 mg DR capsule (Expired)    Take 1 capsule (40 mg total) by mouth daily.   * ondansetron  (ZOFRAN ) 4 mg tablet   * ondansetron  (ZOFRAN -ODT) 4 mg disintegrating tablet    Dissolve 1 tablet (4 mg total) on tongue every 8 (eight) hours as needed for nausea or vomiting.   * oxyCODONE  (ROXICODONE ) 10 mg tab   * pantoprazole  (PROTONIX ) 40 mg EC tablet (Expired)    Take 1 tablet (40 mg total) by mouth every morning before breakfast.   * pregabalin  (LYRICA ) 100 mg capsule   * sucralfate  (CARAFATE ) 100 mg/mL oral suspension   * tamsulosin  (FLOMAX ) 0.4 mg cap   *  thiamine  (VITAMIN B1) 100 mg tablet (Expired)    Take 1 tablet (100 mg total) by mouth daily.         Allergies Allergies[3]   Family History Family History[4]   Social History Social History   Socioeconomic History  . Marital status: Single    Spouse name: Not on file  . Number of children: Not on file  . Years of education: Not on file  . Highest education level: Not on file  Occupational History  . Not on file  Tobacco Use  . Smoking status: Every Day    Current packs/day: 1.00    Types: Cigarettes  . Smokeless tobacco: Never  Substance and Sexual Activity  . Alcohol  use: Yes  . Drug use: Yes    Types: Marijuana  . Sexual activity: Not on file  Other Topics Concern  . Not on file  Social History Narrative  . Not on file   Social Drivers of Health   Food Insecurity: No Food Insecurity  (08/10/2023)   Received from [facility]   Food vital sign   . Within the past 12 months, you worried that your food would run out before you got money to buy more: Never true   . Within the past 12 months, the food you bought just didn't last and you didn't have money to get more: Never true  Transportation Needs: No Transportation Needs (08/10/2023)   Received from [facility] - Transportation   . Lack of Transportation (Medical): No   . Lack of Transportation (Non-Medical): No  Safety: Not At Risk (08/10/2023)   Received from [facility]   Safety   . Within the last year, have you been afraid of your partner or ex-partner?: No   . Within the last year, have you been humiliated or emotionally abused in other ways by your partner or ex-partner?: No   . Within the last year, have you been kicked, hit, slapped, or otherwise physically hurt by your partner or ex-partner?: No   . Within the last year, have you been raped or forced to have any kind of sexual activity by your partner or ex-partner?: No  Living Situation: Low Risk  (08/10/2023)   Received from [facility] Situation   . In the last 12 months, was there a time when you were not able to pay the mortgage or rent on time?: No   . In the past 12 months, how many times have you moved where you were living?: 0   . At any time in the past 12 months, were you homeless or living in a shelter (including now)?: No    Review of Systems  Review of Systems  Constitutional:  Positive for activity change and fatigue. Negative for appetite change and fever.  Respiratory:  Negative for chest tightness.   Cardiovascular:  Positive for chest pain. Negative for leg swelling.  Gastrointestinal:  Positive for abdominal pain, nausea and vomiting.  Genitourinary:  Negative for flank pain.  Neurological:  Positive for light-headedness.     Physical Exam   Vitals:   10/19/23 1159 10/19/23 1300 10/19/23 1400 10/19/23 1904  BP: (!)  194/122 (!) 184/124 (!) 175/125 148/89  BP Location: Left arm   Right arm  Patient Position: Lying   Sitting  Pulse: 82 88 78 70  Resp: 15 12 15 16   Temp: 98.1 F (36.7 C)     TempSrc: Oral     SpO2: 100%  99% 100%  Weight: 55.8 kg (123 lb)     Height: 172.7 cm (5' 8)       Physical Exam Vitals and nursing note reviewed.  Constitutional:      Appearance: Normal appearance.  HENT:     Head: Normocephalic and atraumatic.     Nose: Nose normal.     Mouth/Throat:     Mouth: Mucous membranes are moist.     Dentition: Abnormal dentition.   Cardiovascular:     Rate and Rhythm: Normal rate and regular rhythm.     Pulses: Normal pulses.  Pulmonary:     Effort: Pulmonary effort is normal.     Breath sounds: Normal breath sounds.  Abdominal:     Tenderness: There is abdominal tenderness in the epigastric area. There is no guarding or rebound.   Musculoskeletal:        General: Normal range of motion.   Skin:    General: Skin is warm and dry.     Capillary Refill: Capillary refill takes less than 2 seconds.   Neurological:     Mental Status: He is alert.     GCS: GCS eye subscore is 4. GCS verbal subscore is 5. GCS motor subscore is 6.   Psychiatric:        Attention and Perception: Attention normal.        Mood and Affect: Mood is anxious.        Speech: Speech normal.        Cognition and Memory: Cognition normal.        Judgment: Judgment is impulsive and inappropriate.     Comments: Patient does exhibit qualities of opiate dependence given his bargaining for narcotics     Labs   Abnormal Labs Reviewed  COMPREHENSIVE METABOLIC PANEL - Abnormal; Notable for the following components:      Result Value   Potassium 4.6 (*)    Chloride 110 (*)    CO2 14 (*)    Glucose, Random 61 (*)    Creatinine 1.31 (*)    Aspartate Aminotransferase (AST) 123 (*)    Calcium  8.3 (*)    All other components within normal limits  CBC WITH DIFFERENTIAL - Abnormal; Notable for the  following components:   RBC 4.06 (*)    Hemoglobin 11.6 (*)    Hematocrit 37.3 (*)    Mean Corpuscular Hemoglobin Conc (MCHC) 31.0 (*)    Red Cell Distribution Width (RDW) 20.7 (*)    Monocytes # 1.00 (*)    All other components within normal limits  POC GLUCOSE (AH) - Abnormal; Notable for the following components:   Glucose, POC 156 (*)    All other components within normal limits  POC GLUCOSE (AH) - Abnormal; Notable for the following components:   Glucose, POC 107 (*)    All other components within normal limits        Radiology    Differential Diagnosis  Differential diagnosis includes chronic pain syndrome, anemia, dehydration, pneumonia, pneumothorax, chronic pancreatitis, acute pancreatitis  EKG  My interpretation of EKG is as follows: EKG time: 1156 Interpretation time:12:46 PM  Rate: 67 Rhythm: Sinus rhythm with sinus arrhythmia Axis: normal Intervals: Normal ST-T Waves: Normal ST-T Waves Comparison with Old: The only change from Oct 05, 2023 is sinus with sinus arrhythmia replaces previous sinus rhythm in the previous anterior lateral T waves inversion have resolved  My interpretation of EKG is as follows: EKG time: 1050, EMS EKG Interpretation time:1:20 PM  Rate:  86 Rhythm: normal sinus rhythm Axis: normal Intervals: Normal ST-T Waves: Nonspecific T wave abnormality Comparison with Old:    ED Course   ED Course as of 10/19/23 2155  Thu Oct 19, 2023  8177 Patient was given IV fluid in the emergency department.  Patient was also given Zofran  in addition to fentanyl  and IV fluid in the Emergency Department.  Patient understands the fentanyl  was a one-time dose as we have a chronic pain protocol.  Anti-inflammatories were avoided given the patient's renal insufficiency.  I did review all the findings with the patient at length.  He understands the limitations of our emergency evaluation for his chronic ongoing condition.  He does have a follow-up with pain  management.  Patient did try to bargain for a prescription for oxycodone .  He was respectfully informed that we would be glad to prescribe his chronic medications that are nonnarcotic based. [KL]  2138 CBC, CMP for leukocytosis, leukopenia, anemia, thrombocytopenia hyponatremia, hypernatremia, hypokalemia, hyperkalemia, acidosis, acute kidney injury, hyperglycemia, acute liver injury. Urinalysis ordered to evaluate for infection, hematuria, proteinuria, dehydration, glucosuria    [KL]  2139 My interpretation of lab work shows a normal white count of 7.15 and hemoglobin 11.6 with a normal MCV indicating normocytic anemia.  Patient has a lipase of 18 so there is no evidence of acute pancreatitis.  CMP has a potassium of 4.6 and a CO2 of 14 with a anion gap of 12 and a glucose of 61 and a creatinine 1.31.  This is renal insufficiency with mild hypoglycemia with mild decreased CO2 but no elevated anion gap.  This could be from dehydration.  Of note with the glucose of 61 the patient's fingerstick shortly thereafter was 156. [KL]  2152 Patient was given 2 L of IV fluid in the emergency department.  He was also given pain medication with the expectation that was a one-time dose.  Bedside signout was performed with Dr. Jeneal.  Patient is now tolerated p.o. intake including liquids and solids.  Plan was IV fluid and reassess patient's metabolic panel given the low CO2 however no elevated anion gap and patient does not appear to be an extremis [KL]    ED Course User Index [KL] Franky Rosalynn Fleischer, MD    Procedure Note  Procedures   Clinical Complexity  Patient's presentation is most consistent with acute presentation with potential threat to life or bodily function.  OTC medications advised and discussed with patient include Tylenol .  Prescription medications discussed: Metoprolol  and Protonix  and Carafate  but we will not prescribe narcotic medication  Decision to admit or increased level of care  requiring transfer: While I initially considered hospital admission, based on reassuring ED work-up and response to treatment, I feel that patient is appropriate for discharge home with follow-up.  Strict return precautions regarding this ED visit were discussed.  Patient's alcoholism/drug addiction increases the complexity of managing their presentation and will complicate their treatment plans. I have addressed this by provided education about medical condition and treatment needed.   Pt presentation is complicated by their history of chronic pancreatitis, resulting in increased risk of frequent ED usage  Discussed options for workup with patient including risks and benefits via shared decision making and decided to proceed with workup. Medical Decision Making  Medical Decision Making Problems Addressed: Chest pain, unspecified type: complicated acute illness or injury with systemic symptoms that poses a threat to life or bodily functions Chronic abdominal pain: complicated acute illness or injury with systemic symptoms that  poses a threat to life or bodily functions Normocytic anemia: complicated acute illness or injury with systemic symptoms that poses a threat to life or bodily functions Renal insufficiency: complicated acute illness or injury with systemic symptoms that poses a threat to life or bodily functions  Amount and/or Complexity of Data Reviewed Independent Historian: EMS    Details: Reviewed EMS documentation including the patient's recent visit to [facility] emergency department for similar symptoms External Data Reviewed: labs, ECG and notes.    Details: I did review the patient's previous EKG from 01/05/2024 that showed sinus rhythm with T wave inversion anteriorly laterally and inferiorly. I did review the patient's lab work from earlier today at [facility] system and the CO2 was 17 and the creatinine was 1.8 on the patient's metabolic panel. I also reviewed the notes  from the emergency department where patient was seen and was requesting narcotic pain medication and was eventually discharged byDan Emil, DO as he was ripping out his IV per their documentation Labs: ordered. Decision-making details documented in ED Course.    Details: My interpretation of lab work shows a normal white count of 7.15 and hemoglobin 11.6 with a normal MCV indicating normocytic anemia.  Patient has a lipase of 18 so there is no evidence of acute pancreatitis.  CMP has a potassium of 4.6 and a CO2 of 14 with a anion gap of 12 and a glucose of 61 and a creatinine 1.31.  This is renal insufficiency with mild hypoglycemia with mild decreased CO2 but no elevated anion gap.  This could be from dehydration.  Of note with the glucose of 61 the patient's fingerstick shortly thereafter was 156. Radiology: ordered and independent interpretation performed. Decision-making details documented in ED Course.    Details: My interpretation of CXR shows no evidence of pneumothorax. Please note I reviewed the radiologist interpretation of the chest x-ray and also reviewed the images personally. ECG/medicine tests: ordered and independent interpretation performed. Decision-making details documented in ED Course.    Details: My interpretation of EKG is sinus rhythm with sinus arrhythmia with no evidence of T wave inversion Discussion of management or test interpretation with external provider(s): At change of shift patient signed out to Dr. Marinell Brain.  Bedside report was given.  Risk OTC drugs. Prescription drug management. Parenteral controlled substances. Decision regarding hospitalization. Diagnosis or treatment significantly limited by social determinants of health. Risk Details: Heart score is 2 so the patient is low risk     Please see ED course for MDM ED Clinical Impression   1. Chronic abdominal pain Active  2. Chest pain, unspecified type Active  3. Renal insufficiency Active  4.  Normocytic anemia Active    ED Assessment/Plan  Pt is a 53 y.o. male who presented with abdominal pain and chest pain in the setting of chronic pancreatitis. Pt appears to have chronic pain syndrome with mild dehydration and mild elevation of creatinine. Plan is rule out acute pathology and reevaluate patient's metabolic panel after IV fluid and likely discharge.  This document was created using the aid of voice recognition Scientist, clinical (histocompatibility and immunogenetics).    DISCHARGE MEDICATIONS   Medication List     ASK your doctor about these medications    acetaminophen  325 mg tablet Commonly known as: TYLENOL  Take 650 mg by mouth every 6 (six) hours as needed (pain).   albuterol  HFA 90 mcg/actuation inhaler Commonly known as: PROVENTIL  HFA;VENTOLIN  HFA;PROAIR  HFA Inhale 2 puffs every 6 (six) hours as needed  for shortness of breath.   benzonatate  100 mg capsule Commonly known as: TESSALON  Take 100 mg by mouth.   cholecalciferol  10 mcg (400 unit) tablet Commonly known as: VITAMIN D3 Take 1 tablet by mouth Once Daily.   cyclobenzaprine  10 mg tablet Commonly known as: FLEXERIL  Take 10 mg by mouth 2 (two) times a day as needed for muscle spasms.   dilTIAZem 120 mg Cs24 24 hr capsule Commonly known as: TIAZAC Take 1 capsule (120 mg total) by mouth daily.   famotidine  20 mg tablet Commonly known as: PEPCID  Take 1 tablet (20 mg total) by mouth 2 (two) times a day for 15 days.   ferrous sulfate  325 mg (65 mg iron) tablet Take 325 mg by mouth Once Daily.   fluticasone  propionate 50 mcg/spray nasal spray Commonly known as: FLONASE  Administer 2 sprays into affected nostril(s) 2 (two) times a day as needed for allergies.   folic acid  1 mg tablet Commonly known as: FOLVITE  Take 1 tablet (1 mg total) by mouth daily Indications: inadequate folic acid .   Kaopectate (bismuth  subsalicy) 262 mg/15 mL suspension Generic drug: bismuth  subsalicylate Take 15 mL by mouth.   lidocaine  2 % viscous  solution Commonly known as: XYLOCAINE  Take 10 mL by mouth 2 (two) times a day as needed for mild pain (1-3).   Lidoderm  5 % patch Generic drug: lidocaine  Place 1 patch on the skin.   magnesium  oxide 400 mg (241 mg magnesium ) Tab Take 1 tablet (400 mg total) by mouth 2 (two) times a day.   metoprolol  succinate 200 mg 24 hr tablet Commonly known as: TOPROL  XL Take 1 tablet (200 mg total) by mouth daily.   nitroglycerin  0.4 mg SL tablet Commonly known as: NITROSTAT  Place 1 tablet (0.4 mg total) under the tongue every 5 (five) minutes as needed for chest pain.   omeprazole  40 mg DR capsule Commonly known as: PriLOSEC Take 1 capsule (40 mg total) by mouth daily.   ondansetron  4 mg disintegrating tablet Commonly known as: ZOFRAN -ODT Dissolve 1 tablet (4 mg total) on tongue every 8 (eight) hours as needed for nausea or vomiting.   ondansetron  4 mg tablet Commonly known as: ZOFRAN  Take 4 mg by mouth every 8 (eight) hours as needed.   oxyCODONE  10 mg Tab Commonly known as: ROXICODONE  Take 10 mg by mouth every 6 (six) hours as needed.   pantoprazole  40 mg EC tablet Commonly known as: PROTONIX  Take 1 tablet (40 mg total) by mouth every morning before breakfast.   pregabalin  100 mg capsule Commonly known as: LYRICA  Take 100 mg by mouth 2 (two) times a day as needed.   sucralfate  100 mg/mL oral suspension Commonly known as: CARAFATE  TAKE 10 MLS BY MOUTH THREE TIMES DAILY   tamsulosin  0.4 mg Cap Commonly known as: FLOMAX  Take 0.4 mg by mouth every morning.   thiamine  100 mg tablet Commonly known as: VITAMIN B1 Take 1 tablet (100 mg total) by mouth daily.        FOLLOW UP No follow-up provider specified.  ED Disposition     None            [1] Past Medical History: Diagnosis Date  . Alcohol  abuse   . Bipolar 1 disorder    (CMD)    Care Everywhere  . Cocaine abuse (CMD)    Care Everywhere- 07/25/2016  . GERD (gastroesophageal reflux disease)    Care  Everywhere  . Hypertension   . Major depression    Care  Everywhere 07/2012  . Marijuana abuse    Care Everywhere  . Pancreatitis (CMD)   . Seizures    (CMD)    Care Everywhere 11/13/2018  . Sickle cell trait    Care Everywhere  . WPW (Wolff-Parkinson-White syndrome)   [2] Past Surgical History: Procedure Laterality Date  . EYE SURGERY Left    Procedure: EYE SURGERY; Care Everywhere  . FACIAL FRACTURE SURGERY Left 1990   Procedure: FACIAL FRACTURE SURGERY; Care Everywhere  . HERNIA REPAIR     Procedure: HERNIA REPAIR; Care Everywhere  . OTHER SURGICAL HISTORY Left 03/07/2013   Procedure: OTHER SURGICAL HISTORY (left heart catheterization with coronary angioplasty); Care Everywhere  [3] Allergies Allergen Reactions  . Methocarbamol  Other (See Comments)    jumpy limbs  . Aspirin  Bleeding  . Trazodone  Other (See Comments)    Reaction:  Muscle spasms  . Adhesive Itching  . Iodine-131 Other (See Comments)    Pt states he gets hives  . Pork Derived (Porcine)   . Shellfish Containing Products GI Intolerance  . Surgical Tape Itching  . Trazodone  Other (See Comments)    Pt states he gets neck spasms  . Fish Oil Nausea And Vomiting and Rash  . Iodinated Contrast Media Hives  . Latex Hives and Rash  . Metoclopramide  Other (See Comments)    Reaction:  Muscle spasms  [4] No family history on file. *Some images could not be shown.

## 2023-10-19 NOTE — ED Notes (Signed)
 Phlebotomy to collect Ethanol and Troponin.

## 2023-10-21 ENCOUNTER — Other Ambulatory Visit: Payer: Self-pay

## 2023-10-21 ENCOUNTER — Encounter (HOSPITAL_COMMUNITY): Payer: Self-pay

## 2023-10-21 ENCOUNTER — Emergency Department (HOSPITAL_COMMUNITY)
Admission: EM | Admit: 2023-10-21 | Discharge: 2023-10-22 | Disposition: A | Discharge status: Home / Self Care | Payer: MEDICAID | Attending: Emergency Medicine | Admitting: Emergency Medicine

## 2023-10-21 ENCOUNTER — Emergency Department (HOSPITAL_COMMUNITY): Payer: MEDICAID

## 2023-10-21 DIAGNOSIS — R7989 Other specified abnormal findings of blood chemistry: Secondary | ICD-10-CM | POA: Diagnosis not present

## 2023-10-21 DIAGNOSIS — Z91040 Latex allergy status: Secondary | ICD-10-CM | POA: Diagnosis not present

## 2023-10-21 DIAGNOSIS — R000 Tachycardia, unspecified: Secondary | ICD-10-CM | POA: Insufficient documentation

## 2023-10-21 DIAGNOSIS — R079 Chest pain, unspecified: Secondary | ICD-10-CM | POA: Diagnosis present

## 2023-10-21 DIAGNOSIS — K860 Alcohol-induced chronic pancreatitis: Secondary | ICD-10-CM | POA: Insufficient documentation

## 2023-10-21 DIAGNOSIS — Z7951 Long term (current) use of inhaled steroids: Secondary | ICD-10-CM | POA: Insufficient documentation

## 2023-10-21 DIAGNOSIS — Z79899 Other long term (current) drug therapy: Secondary | ICD-10-CM | POA: Insufficient documentation

## 2023-10-21 DIAGNOSIS — F1490 Cocaine use, unspecified, uncomplicated: Secondary | ICD-10-CM | POA: Diagnosis not present

## 2023-10-21 DIAGNOSIS — J45909 Unspecified asthma, uncomplicated: Secondary | ICD-10-CM | POA: Insufficient documentation

## 2023-10-21 DIAGNOSIS — I10 Essential (primary) hypertension: Secondary | ICD-10-CM | POA: Insufficient documentation

## 2023-10-21 LAB — HEPATIC FUNCTION PANEL
ALT: 29 U/L (ref 0–44)
AST: 69 U/L — ABNORMAL HIGH (ref 15–41)
Albumin: 3.5 g/dL (ref 3.5–5.0)
Alkaline Phosphatase: 66 U/L (ref 38–126)
Bilirubin, Direct: 0.3 mg/dL — ABNORMAL HIGH (ref 0.0–0.2)
Indirect Bilirubin: 0.6 mg/dL (ref 0.3–0.9)
Total Bilirubin: 0.9 mg/dL (ref 0.0–1.2)
Total Protein: 6.6 g/dL (ref 6.5–8.1)

## 2023-10-21 LAB — CBC
HCT: 32.5 % — ABNORMAL LOW (ref 39.0–52.0)
Hemoglobin: 10.5 g/dL — ABNORMAL LOW (ref 13.0–17.0)
MCH: 29.9 pg (ref 26.0–34.0)
MCHC: 32.3 g/dL (ref 30.0–36.0)
MCV: 92.6 fL (ref 80.0–100.0)
Platelets: 181 10*3/uL (ref 150–400)
RBC: 3.51 MIL/uL — ABNORMAL LOW (ref 4.22–5.81)
RDW: 20.1 % — ABNORMAL HIGH (ref 11.5–15.5)
WBC: 6.9 10*3/uL (ref 4.0–10.5)
nRBC: 0 % (ref 0.0–0.2)

## 2023-10-21 LAB — BASIC METABOLIC PANEL WITH GFR
Anion gap: 10 (ref 5–15)
BUN: 8 mg/dL (ref 6–20)
CO2: 15 mmol/L — ABNORMAL LOW (ref 22–32)
Calcium: 8.5 mg/dL — ABNORMAL LOW (ref 8.9–10.3)
Chloride: 114 mmol/L — ABNORMAL HIGH (ref 98–111)
Creatinine, Ser: 0.89 mg/dL (ref 0.61–1.24)
GFR, Estimated: >60 mL/min (ref >60)
Glucose, Bld: 98 mg/dL (ref 70–99)
Potassium: 5.2 mmol/L — ABNORMAL HIGH (ref 3.5–5.1)
Sodium: 139 mmol/L (ref 135–145)

## 2023-10-21 LAB — LIPASE, BLOOD: Lipase: 26 U/L (ref 11–51)

## 2023-10-21 LAB — TROPONIN I (HIGH SENSITIVITY)
Troponin I (High Sensitivity): 24 ng/L — ABNORMAL HIGH (ref <18)
Troponin I (High Sensitivity): 21 ng/L — ABNORMAL HIGH (ref <18)

## 2023-10-21 MED ORDER — LIDOCAINE VISCOUS HCL 2 % MT SOLN
10.0000 mL | Freq: Once | OROMUCOSAL | Status: AC
  Administered 2023-10-21: 10 mL via OROMUCOSAL
  Filled 2023-10-21: qty 15

## 2023-10-21 MED ORDER — ALUM & MAG HYDROXIDE-SIMETH 200-200-20 MG/5ML PO SUSP
30.0000 mL | Freq: Once | ORAL | Status: AC
  Administered 2023-10-21: 30 mL via ORAL
  Filled 2023-10-21: qty 30

## 2023-10-21 MED ORDER — SODIUM CHLORIDE 0.9 % IV BOLUS
1000.0000 mL | Freq: Once | INTRAVENOUS | Status: AC
  Administered 2023-10-21: 1000 mL via INTRAVENOUS

## 2023-10-21 MED ORDER — OXYCODONE-ACETAMINOPHEN 5-325 MG PO TABS
1.0000 | ORAL_TABLET | Freq: Once | ORAL | Status: AC
  Administered 2023-10-21: 1 via ORAL
  Filled 2023-10-21: qty 1

## 2023-10-21 NOTE — ED Provider Triage Note (Signed)
 Emergency Medicine Provider Triage Evaluation Note  [name] , a 53 y.o. male  was evaluated in triage.  Pt complains of abdominal pain burning into his chest now.  He notes some shortness of breath.  Patient also describes some profuse diarrhea. Denies any recent abx. Patient set for upper endoscopy this month as this is a recurrent issue for him. He has been taking the Pepcid , Zofran , and viscous lidocaine  without relief.   Review of Systems  Positive: Epigastric pain, diarrhea, and CP w/ SOB Negative: Lower abdominal pain, fever, dysuria  Physical Exam  BP 119/77 (BP Location: Right Arm)   Pulse (!) 109   Temp 99.4 F (37.4 C) (Oral)   Resp 18   Ht 5' 8 (1.727 m)   Wt 55.8 kg   SpO2 100%   BMI 18.70 kg/m  Gen:   Awake, no distress   Resp:  Normal effort  MSK:   Moves extremities without difficulty  Other:  Lungs are CTABL. No distress.   Medical Decision Making  Medically screening exam initiated at 8:26 PM.  Appropriate orders placed.  [name] was informed that the remainder of the evaluation will be completed by another provider, this initial triage assessment does not replace that evaluation, and the importance of remaining in the ED until their evaluation is complete.  EKG reviewed with no acute ischemic change.      Roberts Ching, MD 10/21/23 2029

## 2023-10-21 NOTE — ED Notes (Signed)
 Pt has started vomiting while in the waiting room and asked NT for medication to help. Triage RN notified.

## 2023-10-21 NOTE — ED Triage Notes (Signed)
 PER EMS: pt is from home with c/o central chest pain that radiates to his left arm; onset today around 1930. He has an ablation scheduled for Monday and he was here yesterday for pancreatitis.  BP- 114/78, HR-110, 100% RA, CBG-115

## 2023-10-21 NOTE — ED Provider Notes (Signed)
 [facility] AT [facility] Provider Note   CSN: [id number] Arrival date & time: 10/21/23  2012     Patient presents with: Chest Pain   Roan Abir Craine is a 53 y.o. male.  {Add pertinent medical, surgical, social history, OB history to HPI:32947} HPI    This is a 53 year old male well-known to our emergency department with frequent visits for chronic abdominal pain and chronic pancreatitis who presents with ongoing pain.  Patient reports ongoing abdominal and chest discomfort.  He describes it as burning.  He also reports that he is having diarrhea.  He reports that he is having it so much that he is having to wear depends at night.  Denies recent antibiotic use.  Reports that he is supposed to have an endoscopy soon as well as be set up with pain management.  He is taking his Pepcid , Zofran , Carafate , viscous lidocaine  with minimal relief.  Most recently seen on 6/12 at both [facility] and [facility].  Has not had any fevers.  Last drink alcohol  3 days ago.  Has ongoing cocaine use.   Prior to Admission medications   Medication Sig Start Date End Date Taking? Authorizing Provider  acetaminophen  (TYLENOL ) 500 MG tablet Take 1,000 mg by mouth daily as needed for mild pain (pain score 1-3) or moderate pain (pain score 4-6).    [provider]  calcium -vitamin D  (OSCAL WITH D) 500-5 MG-MCG tablet Take 1 tablet by mouth daily with breakfast. 09/18/23   Ninetta Basket, MD  dicyclomine  (BENTYL ) 20 MG tablet Take 1 tablet (20 mg total) by mouth daily as needed for spasms. Patient not taking: Reported on 08/26/2023 08/21/23   Burton Casey, MD  famotidine  (PEPCID ) 40 MG tablet Take 1 tablet (40 mg total) by mouth daily. 10/17/23   Henderly, Britni A, PA-C  fluticasone  (FLONASE ) 50 MCG/ACT nasal spray Place 2 sprays into both nostrils daily as needed for allergies.    [provider]  folic acid  (FOLVITE ) 1 MG tablet Take 1 tablet  (1 mg total) by mouth daily. Patient not taking: Reported on 09/18/2023 08/21/23   Burton Casey, MD  lidocaine  (XYLOCAINE ) 2 % solution Use as directed 15 mLs in the mouth or throat every 12 (twelve) hours as needed. 10/17/23   Henderly, Britni A, PA-C  lipase/protease/amylase (CREON ) 36000 UNITS CPEP capsule Take 2 capsules (72,000 Units total) by mouth 3 (three) times daily with meals. May also take 1 capsule (36,000 Units total) as needed (with snacks). 08/21/23   Ghimire, Estil Heman, MD  loperamide  (IMODIUM ) 2 MG capsule Take 2 capsules (4 mg total) by mouth as needed for diarrhea or loose stools. 09/25/23   Mesner, Reymundo Caulk, MD  magnesium  oxide (MAG-OX) 400 (240 Mg) MG tablet Take 1 tablet (400 mg total) by mouth daily. Patient taking differently: Take 400 mg by mouth 2 (two) times daily. 08/21/23   Ghimire, Estil Heman, MD  metoprolol  (TOPROL -XL) 200 MG 24 hr tablet Take 1 tablet (200 mg total) by mouth daily. 08/21/23   Ghimire, Estil Heman, MD  Multiple Vitamin (MULTIVITAMIN WITH MINERALS) TABS tablet Take 1 tablet by mouth daily. 08/21/23   Ghimire, Estil Heman, MD  ondansetron  (ZOFRAN ) 4 MG tablet Take 1 tablet (4 mg total) by mouth every 6 (six) hours. 10/17/23   Henderly, Britni A, PA-C  ondansetron  (ZOFRAN -ODT) 4 MG disintegrating tablet Take 1 tablet (4 mg total) by mouth daily as needed for nausea or vomiting. 08/21/23  Ghimire, Estil Heman, MD  oxyCODONE  (ROXICODONE ) 5 MG immediate release tablet Take 1 tablet (5 mg total) by mouth every 6 (six) hours as needed. 10/17/23   Henderly, Britni A, PA-C  pantoprazole  (PROTONIX ) 20 MG tablet Take 1 tablet (20 mg total) by mouth daily. 10/13/23   Auston Blush, MD  psyllium (HYDROCIL/METAMUCIL) 95 % PACK Take 1 packet by mouth daily. Patient not taking: Reported on 09/18/2023 08/21/23   Burton Casey, MD  sodium zirconium cyclosilicate  (LOKELMA ) 10 g PACK packet Take 10 g by mouth 2 (two) times daily. Patient not taking: Reported on 08/26/2023 08/21/23    Burton Casey, MD  sucralfate  (CARAFATE ) 1 g tablet Take 1 tablet (1 g total) by mouth in the morning, at noon, and at bedtime. 09/02/23   Marius Siemens, NP  tamsulosin  (FLOMAX ) 0.4 MG CAPS capsule Take 1 capsule (0.4 mg total) by mouth daily. 08/21/23   Ghimire, Estil Heman, MD  thiamine  (VITAMIN B1) 100 MG tablet Take 1 tablet (100 mg total) by mouth daily. 08/21/23   Ghimire, Estil Heman, MD  amitriptyline  (ELAVIL ) 25 MG tablet Take 1 tablet (25 mg total) by mouth at bedtime. Patient not taking: Reported on 08/08/2019 10/15/18 08/08/19  Kraig Peru, MD    Allergies: Robaxin  [methocarbamol ], Aspirin , Shellfish-derived products, Trazodone  and nefazodone, Adhesive [tape], Fish-derived products, Latex, Toradol  [ketorolac  tromethamine ], Contrast media [iodinated contrast media], Reglan  [metoclopramide ], and Clent Czar oil]    Review of Systems  Constitutional:  Negative for fever.  Respiratory:  Negative for shortness of breath.   Cardiovascular:  Positive for chest pain.  Gastrointestinal:  Positive for abdominal pain, diarrhea and nausea.  All other systems reviewed and are negative.   Updated Vital Signs BP 119/77 (BP Location: Right Arm)   Pulse (!) 109   Temp 99.4 F (37.4 C) (Oral)   Resp 18   Ht 1.727 m (5' 8)   Wt 55.8 kg   SpO2 100%   BMI 18.70 kg/m   Physical Exam Vitals and nursing note reviewed.  Constitutional:      Appearance: He is well-developed. He is not ill-appearing.  HENT:     Head: Normocephalic and atraumatic.   Eyes:     Pupils: Pupils are equal, round, and reactive to light.    Cardiovascular:     Rate and Rhythm: Regular rhythm. Tachycardia present.     Heart sounds: Normal heart sounds. No murmur heard. Pulmonary:     Effort: Pulmonary effort is normal. No respiratory distress.     Breath sounds: Normal breath sounds. No wheezing.  Abdominal:     General: Bowel sounds are normal.     Palpations: Abdomen is soft.     Tenderness: There is  no abdominal tenderness. There is no rebound.   Musculoskeletal:     Cervical back: Neck supple.  Lymphadenopathy:     Cervical: No cervical adenopathy.   Skin:    General: Skin is warm and dry.   Neurological:     Mental Status: He is alert and oriented to person, place, and time.   Psychiatric:        Mood and Affect: Mood normal.     (all labs ordered are listed, but only abnormal results are displayed) Labs Reviewed  BASIC METABOLIC PANEL WITH GFR - Abnormal; Notable for the following components:      Result Value   Potassium 5.2 (*)    Chloride 114 (*)    CO2 15 (*)  Calcium  8.5 (*)    All other components within normal limits  CBC - Abnormal; Notable for the following components:   RBC 3.51 (*)    Hemoglobin 10.5 (*)    HCT 32.5 (*)    RDW 20.1 (*)    All other components within normal limits  HEPATIC FUNCTION PANEL - Abnormal; Notable for the following components:   AST 69 (*)    Bilirubin, Direct 0.3 (*)    All other components within normal limits  TROPONIN I (HIGH SENSITIVITY) - Abnormal; Notable for the following components:   Troponin I (High Sensitivity) 24 (*)    All other components within normal limits  TROPONIN I (HIGH SENSITIVITY) - Abnormal; Notable for the following components:   Troponin I (High Sensitivity) 21 (*)    All other components within normal limits  C DIFFICILE QUICK SCREEN W PCR REFLEX    LIPASE, BLOOD    EKG: None  Radiology: DG Chest 2 View Result Date: 10/21/2023 CLINICAL DATA:  Chest pains. EXAM: CHEST - 2 VIEW COMPARISON:  Recent portable chest 10/19/2023. FINDINGS: The lungs are hyperinflated. There is increased central bronchial thickening consistent with bronchitis. There are overlying rounded artifacts on the left. Possibly lead placement stickers. The lungs are clear of infiltrates.  No pleural effusion is seen. The cardiomediastinal silhouette and vasculature are normal. The bones are mildly demineralized. IMPRESSION:  Hyperinflation with increased central bronchial thickening consistent with bronchitis. No evidence of acute chest disease. Electronically Signed   By: Denman Fischer M.D.   On: 10/21/2023 20:55    {Document cardiac monitor, telemetry assessment procedure when appropriate:32947} Procedures   Medications Ordered in the ED  oxyCODONE -acetaminophen  (PERCOCET/ROXICET) 5-325 MG per tablet 1 tablet (has no administration in time range)  alum & mag hydroxide-simeth (MAALOX/MYLANTA) 200-200-20 MG/5ML suspension 30 mL (30 mLs Oral Given 10/21/23 2031)  lidocaine  (XYLOCAINE ) 2 % viscous mouth solution 10 mL (10 mLs Mouth/Throat Given 10/21/23 2031)      {Click here for ABCD2, HEART and other calculators REFRESH Note before signing:1}                              Medical Decision Making Amount and/or Complexity of Data Reviewed Labs: ordered. Radiology: ordered.  Risk Prescription drug management.   ***  {Document critical care time when appropriate  Document review of labs and clinical decision tools ie CHADS2VASC2, etc  Document your independent review of radiology images and any outside records  Document your discussion with family members, caretakers and with consultants  Document social determinants of health affecting pt's care  Document your decision making why or why not admission, treatments were needed:32947:::1}   Final diagnoses:  None    ED Discharge Orders     None

## 2023-10-21 NOTE — ED Notes (Signed)
Dr. Horton at bedside. 

## 2023-10-22 ENCOUNTER — Emergency Department (HOSPITAL_COMMUNITY): Payer: MEDICAID

## 2023-10-22 MED ORDER — ONDANSETRON 4 MG PO TBDP
4.0000 mg | ORAL_TABLET | Freq: Every day | ORAL | 0 refills | Status: DC | PRN
Start: 2023-10-22 — End: 2023-10-22

## 2023-10-22 MED ORDER — LIDOCAINE VISCOUS HCL 2 % MT SOLN: 15.0000 mL | Freq: Two times a day (BID) | OROMUCOSAL | 0 refills | Status: DC | PRN

## 2023-10-22 MED ORDER — ONDANSETRON 4 MG PO TBDP
4.0000 mg | ORAL_TABLET | Freq: Every day | ORAL | 0 refills | Status: DC | PRN
Start: 2023-10-22 — End: 2023-11-13

## 2023-10-22 MED ORDER — OXYCODONE-ACETAMINOPHEN 5-325 MG PO TABS
1.0000 | ORAL_TABLET | Freq: Once | ORAL | Status: AC
  Administered 2023-10-22: 1 via ORAL
  Filled 2023-10-22: qty 1

## 2023-10-22 NOTE — ED Notes (Signed)
..  The patient is A&OX4, ambulatory at d/c with independent steady gait, NAD. Pt verbalized understanding of d/c instructions, prescriptions and follow up care.

## 2023-10-22 NOTE — Discharge Instructions (Signed)
 You were seen today for ongoing abdominal and chest pain.  This is likely related to your chronic pain and chronic pancreatitis.  If you continue to drink this will only continue your pain.  Additionally, your recreational cocaine use is putting you at risk for complications with your heart.  Follow-up with pain management as scheduled.

## 2023-11-06 ENCOUNTER — Emergency Department (HOSPITAL_COMMUNITY)
Admission: EM | Admit: 2023-11-06 | Discharge: 2023-11-07 | Disposition: A | Discharge status: Home / Self Care | Payer: MEDICAID | Attending: Emergency Medicine | Admitting: Emergency Medicine

## 2023-11-06 ENCOUNTER — Other Ambulatory Visit: Payer: Self-pay

## 2023-11-06 ENCOUNTER — Emergency Department (HOSPITAL_COMMUNITY): Payer: MEDICAID

## 2023-11-06 ENCOUNTER — Encounter (HOSPITAL_COMMUNITY): Payer: Self-pay

## 2023-11-06 DIAGNOSIS — K860 Alcohol-induced chronic pancreatitis: Secondary | ICD-10-CM | POA: Insufficient documentation

## 2023-11-06 DIAGNOSIS — Z91040 Latex allergy status: Secondary | ICD-10-CM | POA: Insufficient documentation

## 2023-11-06 DIAGNOSIS — K2950 Unspecified chronic gastritis without bleeding: Secondary | ICD-10-CM

## 2023-11-06 DIAGNOSIS — R109 Unspecified abdominal pain: Secondary | ICD-10-CM | POA: Diagnosis present

## 2023-11-06 NOTE — ED Triage Notes (Signed)
 Pt bib PTAR c.o right sided chest pain and abd pain, hx of pancreatitis. States he is scheduled for an ablation soon.

## 2023-11-07 ENCOUNTER — Emergency Department (HOSPITAL_COMMUNITY): Payer: MEDICAID

## 2023-11-07 LAB — URINALYSIS, ROUTINE W REFLEX MICROSCOPIC
Bilirubin Urine: NEGATIVE
Glucose, UA: NEGATIVE mg/dL
Hgb urine dipstick: NEGATIVE
Ketones, ur: NEGATIVE mg/dL
Leukocytes,Ua: NEGATIVE
Nitrite: NEGATIVE
Protein, ur: NEGATIVE mg/dL
Specific Gravity, Urine: 1.002 — ABNORMAL LOW (ref 1.005–1.030)
pH: 6 (ref 5.0–8.0)

## 2023-11-07 LAB — COMPREHENSIVE METABOLIC PANEL WITH GFR
ALT: 36 U/L (ref 0–44)
AST: 58 U/L — ABNORMAL HIGH (ref 15–41)
Albumin: 3.7 g/dL (ref 3.5–5.0)
Alkaline Phosphatase: 66 U/L (ref 38–126)
Anion gap: 11 (ref 5–15)
BUN: 7 mg/dL (ref 6–20)
CO2: 18 mmol/L — ABNORMAL LOW (ref 22–32)
Calcium: 8.1 mg/dL — ABNORMAL LOW (ref 8.9–10.3)
Chloride: 107 mmol/L (ref 98–111)
Creatinine, Ser: 0.73 mg/dL (ref 0.61–1.24)
GFR, Estimated: >60 mL/min (ref >60)
Glucose, Bld: 96 mg/dL (ref 70–99)
Potassium: 3.6 mmol/L (ref 3.5–5.1)
Sodium: 136 mmol/L (ref 135–145)
Total Bilirubin: 0.7 mg/dL (ref 0.0–1.2)
Total Protein: 6.6 g/dL (ref 6.5–8.1)

## 2023-11-07 LAB — CBC
HCT: 30.4 % — ABNORMAL LOW (ref 39.0–52.0)
Hemoglobin: 9.9 g/dL — ABNORMAL LOW (ref 13.0–17.0)
MCH: 29.6 pg (ref 26.0–34.0)
MCHC: 32.6 g/dL (ref 30.0–36.0)
MCV: 90.7 fL (ref 80.0–100.0)
Platelets: 203 10*3/uL (ref 150–400)
RBC: 3.35 MIL/uL — ABNORMAL LOW (ref 4.22–5.81)
RDW: 17.6 % — ABNORMAL HIGH (ref 11.5–15.5)
WBC: 6.5 10*3/uL (ref 4.0–10.5)
nRBC: 0 % (ref 0.0–0.2)

## 2023-11-07 LAB — LIPASE, BLOOD: Lipase: 27 U/L (ref 11–51)

## 2023-11-07 LAB — TROPONIN I (HIGH SENSITIVITY)
Troponin I (High Sensitivity): 6 ng/L (ref <18)
Troponin I (High Sensitivity): 8 ng/L (ref <18)

## 2023-11-07 LAB — POC OCCULT BLOOD, ED: Fecal Occult Bld: NEGATIVE

## 2023-11-07 LAB — D-DIMER, QUANTITATIVE: D-Dimer, Quant: 0.29 ug{FEU}/mL (ref 0.00–0.50)

## 2023-11-07 MED ORDER — MAGNESIUM OXIDE -MG SUPPLEMENT 400 (240 MG) MG PO TABS: 400.0000 mg | ORAL_TABLET | Freq: Every day | ORAL | 0 refills | Status: AC

## 2023-11-07 MED ORDER — LIDOCAINE VISCOUS HCL 2 % MT SOLN: 15.0000 mL | Freq: Two times a day (BID) | OROMUCOSAL | 0 refills | Status: DC | PRN

## 2023-11-07 MED ORDER — FAMOTIDINE 20 MG PO TABS
20.0000 mg | ORAL_TABLET | Freq: Once | ORAL | Status: AC
  Administered 2023-11-07: 20 mg via ORAL
  Filled 2023-11-07: qty 1

## 2023-11-07 MED ORDER — ONDANSETRON HCL 4 MG/2ML IJ SOLN: 4.0000 mg | Freq: Four times a day (QID) | INTRAMUSCULAR | Status: DC | PRN

## 2023-11-07 MED ORDER — OXYCODONE HCL 5 MG PO TABS: 5.0000 mg | ORAL_TABLET | ORAL | 0 refills | Status: DC | PRN

## 2023-11-07 MED ORDER — OXYCODONE-ACETAMINOPHEN 5-325 MG PO TABS
1.0000 | ORAL_TABLET | Freq: Once | ORAL | Status: AC
  Administered 2023-11-07: 1 via ORAL
  Filled 2023-11-07: qty 1

## 2023-11-07 MED ORDER — MORPHINE SULFATE (PF) 4 MG/ML IV SOLN
4.0000 mg | Freq: Once | INTRAVENOUS | Status: AC
  Administered 2023-11-07: 4 mg via INTRAVENOUS
  Filled 2023-11-07: qty 1

## 2023-11-07 MED ORDER — SUCRALFATE 1 G PO TABS: 1.0000 g | ORAL_TABLET | Freq: Three times a day (TID) | ORAL | 1 refills | Status: DC

## 2023-11-07 MED ORDER — MORPHINE SULFATE (PF) 4 MG/ML IV SOLN: 4.0000 mg | Freq: Once | INTRAVENOUS | Status: DC

## 2023-11-07 MED ORDER — PANTOPRAZOLE SODIUM 20 MG PO TBEC
20.0000 mg | DELAYED_RELEASE_TABLET | Freq: Every day | ORAL | 0 refills | Status: DC
Start: 2023-11-07 — End: 2023-11-13

## 2023-11-07 MED ORDER — SODIUM CHLORIDE 0.9 % IV BOLUS
1000.0000 mL | Freq: Once | INTRAVENOUS | Status: AC
Start: 2023-11-07 — End: 2023-11-07
  Administered 2023-11-07: 1000 mL via INTRAVENOUS

## 2023-11-07 MED ORDER — ALUM & MAG HYDROXIDE-SIMETH 200-200-20 MG/5ML PO SUSP
30.0000 mL | Freq: Once | ORAL | Status: AC
  Administered 2023-11-07: 30 mL via ORAL
  Filled 2023-11-07: qty 30

## 2023-11-07 MED ORDER — ONDANSETRON 4 MG PO TBDP
4.0000 mg | ORAL_TABLET | Freq: Once | ORAL | Status: AC
  Administered 2023-11-07: 4 mg via ORAL
  Filled 2023-11-07: qty 1

## 2023-11-07 NOTE — ED Provider Notes (Signed)
 [facility] Provider Note   CSN: [id number] Arrival date & time: 11/06/23  2305     Patient presents with: Abdominal Pain and Chest Pain   [name] is a 53 y.o. male with PMHx WPW, chronic pancreatitis, polysubstance abuse, GERD, seizures, paroxysmal afib, who presents to ED concerned for chest and abdominal pain since last night. Patient stating that symptoms feel similar to past pancreatitis flares. Patient endorses 2 episodes of vomiting in the past 24 hours. Patient also endorsing intermittent diarrhea x3 days. Patient noting small amount of bright red blood on toilet paper after a BM 4 days ago which has since resolved.     Abdominal Pain Associated symptoms: chest pain   Chest Pain Associated symptoms: abdominal pain        Prior to Admission medications   Medication Sig Start Date End Date Taking? Authorizing Provider  oxyCODONE  (ROXICODONE ) 5 MG immediate release tablet Take 1 tablet (5 mg total) by mouth every 4 (four) hours as needed for up to 5 doses. 11/07/23  Yes Hoy Fraction F, PA-C  acetaminophen  (TYLENOL ) 500 MG tablet Take 1,000 mg by mouth daily as needed for mild pain (pain score 1-3) or moderate pain (pain score 4-6).    [provider]  calcium -vitamin D  (OSCAL WITH D) 500-5 MG-MCG tablet Take 1 tablet by mouth daily with breakfast. 09/18/23   Yolande Lamar BROCKS, MD  dicyclomine  (BENTYL ) 20 MG tablet Take 1 tablet (20 mg total) by mouth daily as needed for spasms. Patient not taking: Reported on 08/26/2023 08/21/23   Raenelle Donalda HERO, MD  famotidine  (PEPCID ) 40 MG tablet Take 1 tablet (40 mg total) by mouth daily. 10/17/23   Henderly, Britni A, PA-C  fluticasone  (FLONASE ) 50 MCG/ACT nasal spray Place 2 sprays into both nostrils daily as needed for allergies.    [provider]  folic acid  (FOLVITE ) 1 MG tablet Take 1 tablet (1 mg total) by mouth daily. Patient not taking: Reported on  09/18/2023 08/21/23   Raenelle Donalda HERO, MD  lidocaine  (XYLOCAINE ) 2 % solution Use as directed 15 mLs in the mouth or throat every 12 (twelve) hours as needed. 11/07/23   Hoy Fraction FALCON, PA-C  lipase/protease/amylase (CREON ) 36000 UNITS CPEP capsule Take 2 capsules (72,000 Units total) by mouth 3 (three) times daily with meals. May also take 1 capsule (36,000 Units total) as needed (with snacks). 08/21/23   Ghimire, Donalda HERO, MD  loperamide  (IMODIUM ) 2 MG capsule Take 2 capsules (4 mg total) by mouth as needed for diarrhea or loose stools. 09/25/23   Mesner, Selinda, MD  magnesium  oxide (MAG-OX) 400 (240 Mg) MG tablet Take 1 tablet (400 mg total) by mouth daily. 11/07/23   Hoy Fraction FALCON, PA-C  metoprolol  (TOPROL -XL) 200 MG 24 hr tablet Take 1 tablet (200 mg total) by mouth daily. 08/21/23   Ghimire, Donalda HERO, MD  Multiple Vitamin (MULTIVITAMIN WITH MINERALS) TABS tablet Take 1 tablet by mouth daily. 08/21/23   Ghimire, Donalda HERO, MD  ondansetron  (ZOFRAN ) 4 MG tablet Take 1 tablet (4 mg total) by mouth every 6 (six) hours. 10/17/23   Henderly, Britni A, PA-C  ondansetron  (ZOFRAN -ODT) 4 MG disintegrating tablet Take 1 tablet (4 mg total) by mouth daily as needed for nausea or vomiting. 10/22/23   Horton, Charmaine FALCON, MD  pantoprazole  (PROTONIX ) 20 MG tablet Take 1 tablet (20 mg total) by mouth daily. 10/13/23   Levander Houston, MD  psyllium (HYDROCIL/METAMUCIL) 95 %  PACK Take 1 packet by mouth daily. Patient not taking: Reported on 09/18/2023 08/21/23   Raenelle Donalda HERO, MD  sodium zirconium cyclosilicate  (LOKELMA ) 10 g PACK packet Take 10 g by mouth 2 (two) times daily. Patient not taking: Reported on 08/26/2023 08/21/23   Raenelle Donalda HERO, MD  sucralfate  (CARAFATE ) 1 g tablet Take 1 tablet (1 g total) by mouth in the morning, at noon, and at bedtime. 09/02/23   Celestia Rosaline SQUIBB, NP  tamsulosin  (FLOMAX ) 0.4 MG CAPS capsule Take 1 capsule (0.4 mg total) by mouth daily. 08/21/23   Ghimire, Donalda HERO, MD   thiamine  (VITAMIN B1) 100 MG tablet Take 1 tablet (100 mg total) by mouth daily. 08/21/23   Ghimire, Donalda HERO, MD  amitriptyline  (ELAVIL ) 25 MG tablet Take 1 tablet (25 mg total) by mouth at bedtime. Patient not taking: Reported on 08/08/2019 10/15/18 08/08/19  Tobie Yetta HERO, MD    Allergies: Robaxin  [methocarbamol ], Aspirin , Shellfish-derived products, Trazodone  and nefazodone, Adhesive [tape], Fish-derived products, Latex, Toradol  [ketorolac  tromethamine ], Contrast media [iodinated contrast media], Reglan  [metoclopramide ], and Garnell gums oil]    Review of Systems  Cardiovascular:  Positive for chest pain.  Gastrointestinal:  Positive for abdominal pain.    Updated Vital Signs BP (!) 149/121 (BP Location: Right Arm)   Pulse 80   Temp 97.8 F (36.6 C) (Oral)   Resp 18   Ht 5' 8 (1.727 m)   Wt 55.8 kg   SpO2 100%   BMI 18.70 kg/m   Physical Exam Vitals and nursing note reviewed.  Constitutional:      General: He is not in acute distress.    Appearance: He is not ill-appearing or toxic-appearing.  HENT:     Head: Normocephalic and atraumatic.     Mouth/Throat:     Mouth: Mucous membranes are moist.     Pharynx: No posterior oropharyngeal erythema.   Eyes:     General: No scleral icterus.       Right eye: No discharge.        Left eye: No discharge.     Conjunctiva/sclera: Conjunctivae normal.    Cardiovascular:     Rate and Rhythm: Normal rate and regular rhythm.     Pulses: Normal pulses.     Heart sounds: Normal heart sounds. No murmur heard. Pulmonary:     Effort: Pulmonary effort is normal. No respiratory distress.     Breath sounds: Normal breath sounds. No wheezing, rhonchi or rales.  Abdominal:     General: Abdomen is flat. Bowel sounds are normal. There is no distension.     Palpations: Abdomen is soft.     Tenderness: There is abdominal tenderness in the left upper quadrant and left lower quadrant.  Genitourinary:    Comments: DRE chaperoned with Melissa,  RN No obvious hemorrhoids.  Stool sample without any obvious blood.  Grossly light brown.  Musculoskeletal:     Right lower leg: No edema.     Left lower leg: No edema.   Skin:    General: Skin is warm and dry.     Findings: No rash.   Neurological:     General: No focal deficit present.     Mental Status: He is alert and oriented to person, place, and time. Mental status is at baseline.   Psychiatric:        Mood and Affect: Mood normal.        Behavior: Behavior normal.     (all labs ordered are listed,  but only abnormal results are displayed) Labs Reviewed  COMPREHENSIVE METABOLIC PANEL WITH GFR - Abnormal; Notable for the following components:      Result Value   CO2 18 (*)    Calcium  8.1 (*)    AST 58 (*)    All other components within normal limits  CBC - Abnormal; Notable for the following components:   RBC 3.35 (*)    Hemoglobin 9.9 (*)    HCT 30.4 (*)    RDW 17.6 (*)    All other components within normal limits  URINALYSIS, ROUTINE W REFLEX MICROSCOPIC - Abnormal; Notable for the following components:   Color, Urine COLORLESS (*)    Specific Gravity, Urine 1.002 (*)    All other components within normal limits  LIPASE, BLOOD  D-DIMER, QUANTITATIVE  POC OCCULT BLOOD, ED  POC OCCULT BLOOD, ED  TROPONIN I (HIGH SENSITIVITY)  TROPONIN I (HIGH SENSITIVITY)    EKG: None  Radiology: US  Abdomen Limited RUQ (LIVER/GB) Result Date: 11/07/2023 CLINICAL DATA:  Right upper quadrant pain EXAM: ULTRASOUND ABDOMEN LIMITED RIGHT UPPER QUADRANT COMPARISON:  None Available. FINDINGS: Gallbladder: No gallstones or wall thickening visualized. No sonographic Murphy sign noted by sonographer. Common bile duct: Diameter: 5 mm Liver: No focal lesion identified. Within normal limits in parenchymal echogenicity. Portal vein is patent on color Doppler imaging with normal direction of blood flow towards the liver. Other: None. IMPRESSION: No gallstones or ductal dilatation.  Electronically Signed   By: Ranell Bring M.D.   On: 11/07/2023 10:12   CT ABDOMEN PELVIS WO CONTRAST Result Date: 11/07/2023 CLINICAL DATA:  Left lower quadrant abdominal pain, chronic pancreatitis, diarrhea EXAM: CT ABDOMEN AND PELVIS WITHOUT CONTRAST TECHNIQUE: Multidetector CT imaging of the abdomen and pelvis was performed following the standard protocol without IV contrast. RADIATION DOSE REDUCTION: This exam was performed according to the departmental dose-optimization program which includes automated exposure control, adjustment of the mA and/or kV according to patient size and/or use of iterative reconstruction technique. COMPARISON:  CT abdomen pelvis 10/22/2023 FINDINGS: Lower chest: No acute abnormality. Hepatobiliary: No focal liver abnormality is seen. No gallstones, gallbladder wall thickening, or biliary dilatation. Pancreas: Coarse calcifications in the pancreatic head and body compatible with sequela of chronic pancreatitis. 4.0 cm and 1.2 cm cysts in the pancreatic talar similar to prior and compatible with pseudocysts. No evidence of acute pancreatic inflammation. No ductal dilation. Spleen: Unremarkable. Adrenals/Urinary Tract: Unremarkable adrenal glands. No urinary calculi or hydronephrosis. Unremarkable bladder. Stomach/Bowel: Normal caliber large and small bowel. No bowel wall thickening. Stomach is unremarkable. Vascular/Lymphatic: Aortic atherosclerosis. No enlarged abdominal or pelvic lymph nodes. Unremarkable. Reproductive: Air. Other: No free intraperitoneal fluid or Musculoskeletal: No acute fracture. IMPRESSION: 1. No acute abnormality in the abdomen or pelvis. 2. 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 No evidence of acute pancreatic inflammation. 3. Aortic Atherosclerosis (ICD10-I70.0). Electronically Signed   By: Norman Gatlin M.D.   On: 11/07/2023 01:54   DG Chest 2 View Result Date: 11/06/2023 CLINICAL DATA:  Chest pain EXAM: CHEST - 2 VIEW  COMPARISON:  10/21/2023 FINDINGS: The heart size and mediastinal contours are within normal limits. Both lungs are clear. The visualized skeletal structures are unremarkable. IMPRESSION: No active cardiopulmonary disease. Electronically Signed   By: Oneil Devonshire M.D.   On: 11/06/2023 23:39     Procedures   Medications Ordered in the ED  ondansetron  (ZOFRAN ) injection 4 mg (has no administration in time range)  famotidine  (PEPCID ) tablet 20 mg (20  mg Oral Given 11/07/23 0032)  oxyCODONE -acetaminophen  (PERCOCET/ROXICET) 5-325 MG per tablet 1 tablet (1 tablet Oral Given 11/07/23 0032)  ondansetron  (ZOFRAN -ODT) disintegrating tablet 4 mg (4 mg Oral Given 11/07/23 0120)  sodium chloride  0.9 % bolus 1,000 mL (1,000 mLs Intravenous New Bag/Given 11/07/23 1054)  morphine  (PF) 4 MG/ML injection 4 mg (4 mg Intravenous Given 11/07/23 1049)  alum & mag hydroxide-simeth (MAALOX/MYLANTA) 200-200-20 MG/5ML suspension 30 mL (30 mLs Oral Given 11/07/23 1050)  oxyCODONE -acetaminophen  (PERCOCET/ROXICET) 5-325 MG per tablet 1 tablet (1 tablet Oral Given 11/07/23 1205)                                    Medical Decision Making Amount and/or Complexity of Data Reviewed Labs: ordered. Radiology: ordered.  Risk OTC drugs. Prescription drug management.    This patient presents to the ED for concern of abdominal pain, this involves an extensive number of treatment options, and is a complaint that carries with it a high risk of complications and morbidity.  The differential diagnosis includes gastroenteritis, colitis, small bowel obstruction, appendicitis, cholecystitis, pancreatitis, nephrolithiasis, UTI, pyleonephritis   Co morbidities that complicate the patient evaluation   WPW, chronic pancreatitis, polysubstance abuse, GERD, seizures, paroxysmal afib,   Additional history obtained:  Patient with 5 ED visits over the past month for identical symptoms   Problem List / ED Course / Critical interventions /  Medication management  Patient presented for ongoing chest pain and abdominal pain. Patient seen recently for same complaint and symptoms are chronic and intermittent.  Physical exam with LUQ/LLQ tenderness to palpation.  Rest of physical exam reassuring.  Patient afebrile with stable vitals. I Ordered, and personally interpreted labs.  Delta troponin within normal limits.  UA not concerning for infection.  D-dimer within normal limits.  Lipase within normal limits.  CMP with slight AST at 58.  CBC with steadily downtrending hemoglobin over past 3 weeks from 12.3 to 9.9 today. POC occult negative. I ordered imaging studies including chest xray, CT Abd/Pelvis with contrast and RUQ US .  I independently visualized and interpreted imaging and I agree with the radiologist interpretation of no acute process. I personally viewed and interpreted the EKG/cardiac monitored which showed an underlying rhythm of: sinus rhythm. Shared all results with patient.  Answered all questions.  Patient tolerating PO.  Educated patient that he will need follow-up with his GI provider for possible repeat endoscopy given his history of gastritis and no other findings of blood loss on today's workup.  Patient also to follow-up with PCP.  Patient ambulating in ED and is ready to go home.  Patient asking for refill of prescriptions for multiple of his home medicines. I have reviewed the patients home medicines and have made adjustments as needed The patient has been appropriately medically screened and/or stabilized in the ED. I have low suspicion for any other emergent medical condition which would require further screening, evaluation or treatment in the ED or require inpatient management. At time of discharge the patient is hemodynamically stable and in no acute distress. I have discussed work-up results and diagnosis with patient and answered all questions. Patient is agreeable with discharge plan. We discussed strict return  precautions for returning to the emergency department and they verbalized understanding.     Social Determinants of Health:  none       Final diagnoses:  Alcohol -induced chronic pancreatitis [facility])    ED Discharge Orders  Ordered    lidocaine  (XYLOCAINE ) 2 % solution  Every 12 hours PRN        11/07/23 1408    magnesium  oxide (MAG-OX) 400 (240 Mg) MG tablet  Daily        11/07/23 1408    oxyCODONE  (ROXICODONE ) 5 MG immediate release tablet  Every 4 hours PRN        11/07/23 1408               Hoy Nidia FALCON, PA-C 11/07/23 1409    Melvenia Motto, MD 11/07/23 1724

## 2023-11-07 NOTE — ED Provider Triage Note (Signed)
 Emergency Medicine Provider Triage Evaluation Note  [place] Eldrick Penick , a 53 y.o. male  was evaluated in triage.  Pt complains of chest pain and upper abdominal pain. Symptoms started last night. Feels slightly different than normal. Has chronic pancreatitis. Endorsing right sided chest pain with cough. Also endorsing mild amount of blood in stool 3-4 days ago, now having darrhea.  Review of Systems  Positive: Cough, chest pain, abd pain Negative: fever  Physical Exam  BP 130/86 (BP Location: Right Arm)   Pulse (!) 102   Temp 98.2 F (36.8 C) (Oral)   Resp 17   SpO2 99%  Gen:   Awake, in acute pain, clutching upper abdomen Resp:  Normal effort  Abd:  Generalized TTP, guarding MSK:   Moves extremities without difficulty   Medical Decision Making  Medically screening exam initiated at 12:16 AM.  Appropriate orders placed.  Eligio Binyomin Brann was informed that the remainder of the evaluation will be completed by another provider, this initial triage assessment does not replace that evaluation, and the importance of remaining in the ED until their evaluation is complete.  Given focality in the LLQ will obtain CT Abd Pel WO contrast and screening labs. Pain meds ordered.   Jerrol Agent, MD 11/07/23 [phone]

## 2023-11-07 NOTE — ED Notes (Signed)
 Iv team at bedside

## 2023-11-07 NOTE — ED Notes (Signed)
 No acute distress. Pt complains of Left sided abdominal pain that radiates up into his chest. Pain 8/10. Complains of headache and dizziness when walking. Hx of pancreatitis and alcohol  use.

## 2023-11-07 NOTE — ED Notes (Signed)
 Pt transported to [place]

## 2023-11-07 NOTE — ED Notes (Signed)
 Nicotine  gum requested for pt.

## 2023-11-07 NOTE — ED Notes (Signed)
Patient placed onto cardiac monitor

## 2023-11-07 NOTE — Discharge Instructions (Addendum)
 I am glad you are feeling better.  Please follow-up with primary care provider.  You also need to follow-up with your GI provider for possible repeat endoscopy since your hemoglobin continues to steadily drop.  Seek emergency care if experiencing any new or worsening symptoms.

## 2023-11-10 ENCOUNTER — Emergency Department (HOSPITAL_COMMUNITY): Payer: MEDICAID

## 2023-11-10 ENCOUNTER — Other Ambulatory Visit: Payer: Self-pay

## 2023-11-10 ENCOUNTER — Inpatient Hospital Stay (HOSPITAL_COMMUNITY)
Admission: EM | Admit: 2023-11-10 | Discharge: 2023-11-13 | DRG: 312 | Disposition: A | Discharge status: Home / Self Care | Payer: MEDICAID | Attending: Internal Medicine | Admitting: Internal Medicine

## 2023-11-10 ENCOUNTER — Other Ambulatory Visit: Payer: Self-pay | Admitting: Physician Assistant

## 2023-11-10 ENCOUNTER — Observation Stay (HOSPITAL_COMMUNITY): Payer: MEDICAID

## 2023-11-10 ENCOUNTER — Encounter (HOSPITAL_COMMUNITY): Payer: Self-pay

## 2023-11-10 DIAGNOSIS — F1010 Alcohol abuse, uncomplicated: Secondary | ICD-10-CM | POA: Diagnosis present

## 2023-11-10 DIAGNOSIS — R569 Unspecified convulsions: Secondary | ICD-10-CM

## 2023-11-10 DIAGNOSIS — G40909 Epilepsy, unspecified, not intractable, without status epilepticus: Secondary | ICD-10-CM | POA: Diagnosis present

## 2023-11-10 DIAGNOSIS — J4489 Other specified chronic obstructive pulmonary disease: Secondary | ICD-10-CM | POA: Diagnosis present

## 2023-11-10 DIAGNOSIS — M170 Bilateral primary osteoarthritis of knee: Secondary | ICD-10-CM | POA: Diagnosis present

## 2023-11-10 DIAGNOSIS — E7800 Pure hypercholesterolemia, unspecified: Secondary | ICD-10-CM | POA: Diagnosis present

## 2023-11-10 DIAGNOSIS — F1210 Cannabis abuse, uncomplicated: Secondary | ICD-10-CM | POA: Diagnosis present

## 2023-11-10 DIAGNOSIS — R0789 Other chest pain: Secondary | ICD-10-CM

## 2023-11-10 DIAGNOSIS — I160 Hypertensive urgency: Secondary | ICD-10-CM | POA: Diagnosis present

## 2023-11-10 DIAGNOSIS — D573 Sickle-cell trait: Secondary | ICD-10-CM | POA: Diagnosis present

## 2023-11-10 DIAGNOSIS — K861 Other chronic pancreatitis: Secondary | ICD-10-CM

## 2023-11-10 DIAGNOSIS — F1410 Cocaine abuse, uncomplicated: Secondary | ICD-10-CM | POA: Diagnosis present

## 2023-11-10 DIAGNOSIS — Z8701 Personal history of pneumonia (recurrent): Secondary | ICD-10-CM

## 2023-11-10 DIAGNOSIS — R55 Syncope and collapse: Secondary | ICD-10-CM

## 2023-11-10 DIAGNOSIS — I10 Essential (primary) hypertension: Secondary | ICD-10-CM | POA: Diagnosis present

## 2023-11-10 DIAGNOSIS — I480 Paroxysmal atrial fibrillation: Secondary | ICD-10-CM | POA: Diagnosis not present

## 2023-11-10 DIAGNOSIS — Z91119 Patient's noncompliance with dietary regimen due to unspecified reason: Secondary | ICD-10-CM

## 2023-11-10 DIAGNOSIS — F1910 Other psychoactive substance abuse, uncomplicated: Secondary | ICD-10-CM | POA: Diagnosis not present

## 2023-11-10 DIAGNOSIS — I456 Pre-excitation syndrome: Secondary | ICD-10-CM | POA: Diagnosis present

## 2023-11-10 DIAGNOSIS — Z886 Allergy status to analgesic agent status: Secondary | ICD-10-CM

## 2023-11-10 DIAGNOSIS — Z91040 Latex allergy status: Secondary | ICD-10-CM

## 2023-11-10 DIAGNOSIS — F4310 Post-traumatic stress disorder, unspecified: Secondary | ICD-10-CM | POA: Diagnosis present

## 2023-11-10 DIAGNOSIS — Z91013 Allergy to seafood: Secondary | ICD-10-CM

## 2023-11-10 DIAGNOSIS — M19022 Primary osteoarthritis, left elbow: Secondary | ICD-10-CM | POA: Diagnosis present

## 2023-11-10 DIAGNOSIS — Z888 Allergy status to other drugs, medicaments and biological substances status: Secondary | ICD-10-CM

## 2023-11-10 DIAGNOSIS — Z91041 Radiographic dye allergy status: Secondary | ICD-10-CM

## 2023-11-10 DIAGNOSIS — G8929 Other chronic pain: Secondary | ICD-10-CM | POA: Diagnosis present

## 2023-11-10 DIAGNOSIS — Z8711 Personal history of peptic ulcer disease: Secondary | ICD-10-CM

## 2023-11-10 DIAGNOSIS — S52615A Nondisplaced fracture of left ulna styloid process, initial encounter for closed fracture: Secondary | ICD-10-CM | POA: Diagnosis present

## 2023-11-10 DIAGNOSIS — Z79899 Other long term (current) drug therapy: Secondary | ICD-10-CM

## 2023-11-10 DIAGNOSIS — Z6372 Alcoholism and drug addiction in family: Secondary | ICD-10-CM

## 2023-11-10 DIAGNOSIS — Z811 Family history of alcohol abuse and dependence: Secondary | ICD-10-CM

## 2023-11-10 DIAGNOSIS — F17210 Nicotine dependence, cigarettes, uncomplicated: Secondary | ICD-10-CM | POA: Diagnosis present

## 2023-11-10 DIAGNOSIS — T405X1A Poisoning by cocaine, accidental (unintentional), initial encounter: Principal | ICD-10-CM | POA: Diagnosis present

## 2023-11-10 DIAGNOSIS — W19XXXA Unspecified fall, initial encounter: Secondary | ICD-10-CM | POA: Diagnosis present

## 2023-11-10 DIAGNOSIS — M19021 Primary osteoarthritis, right elbow: Secondary | ICD-10-CM | POA: Diagnosis present

## 2023-11-10 DIAGNOSIS — Z8679 Personal history of other diseases of the circulatory system: Secondary | ICD-10-CM | POA: Diagnosis not present

## 2023-11-10 DIAGNOSIS — K219 Gastro-esophageal reflux disease without esophagitis: Secondary | ICD-10-CM | POA: Diagnosis present

## 2023-11-10 DIAGNOSIS — I472 Ventricular tachycardia, unspecified: Secondary | ICD-10-CM | POA: Diagnosis present

## 2023-11-10 DIAGNOSIS — Z8249 Family history of ischemic heart disease and other diseases of the circulatory system: Secondary | ICD-10-CM

## 2023-11-10 DIAGNOSIS — S52502A Unspecified fracture of the lower end of left radius, initial encounter for closed fracture: Secondary | ICD-10-CM | POA: Diagnosis present

## 2023-11-10 DIAGNOSIS — M545 Low back pain, unspecified: Secondary | ICD-10-CM | POA: Diagnosis present

## 2023-11-10 DIAGNOSIS — F332 Major depressive disorder, recurrent severe without psychotic features: Secondary | ICD-10-CM

## 2023-11-10 DIAGNOSIS — R1013 Epigastric pain: Secondary | ICD-10-CM

## 2023-11-10 DIAGNOSIS — I4729 Other ventricular tachycardia: Secondary | ICD-10-CM

## 2023-11-10 DIAGNOSIS — D649 Anemia, unspecified: Secondary | ICD-10-CM | POA: Diagnosis present

## 2023-11-10 DIAGNOSIS — K860 Alcohol-induced chronic pancreatitis: Secondary | ICD-10-CM | POA: Diagnosis present

## 2023-11-10 DIAGNOSIS — Z808 Family history of malignant neoplasm of other organs or systems: Secondary | ICD-10-CM

## 2023-11-10 DIAGNOSIS — Z885 Allergy status to narcotic agent status: Secondary | ICD-10-CM

## 2023-11-10 LAB — RAPID URINE DRUG SCREEN, HOSP PERFORMED
Amphetamines: NOT DETECTED
Barbiturates: NOT DETECTED
Benzodiazepines: POSITIVE — AB
Cocaine: POSITIVE — AB
Opiates: POSITIVE — AB
Tetrahydrocannabinol: NOT DETECTED

## 2023-11-10 LAB — COMPREHENSIVE METABOLIC PANEL WITH GFR
ALT: 25 U/L (ref 0–44)
AST: 54 U/L — ABNORMAL HIGH (ref 15–41)
Albumin: 3.5 g/dL (ref 3.5–5.0)
Alkaline Phosphatase: 68 U/L (ref 38–126)
Anion gap: 12 (ref 5–15)
BUN: 12 mg/dL (ref 6–20)
CO2: 18 mmol/L — ABNORMAL LOW (ref 22–32)
Calcium: 8.6 mg/dL — ABNORMAL LOW (ref 8.9–10.3)
Chloride: 107 mmol/L (ref 98–111)
Creatinine, Ser: 0.96 mg/dL (ref 0.61–1.24)
GFR, Estimated: >60 mL/min (ref >60)
Glucose, Bld: 83 mg/dL (ref 70–99)
Potassium: 4.4 mmol/L (ref 3.5–5.1)
Sodium: 137 mmol/L (ref 135–145)
Total Bilirubin: 0.9 mg/dL (ref 0.0–1.2)
Total Protein: 6.9 g/dL (ref 6.5–8.1)

## 2023-11-10 LAB — CBC WITH DIFFERENTIAL/PLATELET
Abs Immature Granulocytes: 0.05 K/uL (ref 0.00–0.07)
Basophils Absolute: 0 K/uL (ref 0.0–0.1)
Basophils Relative: 0 %
Eosinophils Absolute: 0.1 K/uL (ref 0.0–0.5)
Eosinophils Relative: 1 %
HCT: 32.4 % — ABNORMAL LOW (ref 39.0–52.0)
Hemoglobin: 10.7 g/dL — ABNORMAL LOW (ref 13.0–17.0)
Immature Granulocytes: 1 %
Lymphocytes Relative: 23 %
Lymphs Abs: 2.1 K/uL (ref 0.7–4.0)
MCH: 31 pg (ref 26.0–34.0)
MCHC: 33 g/dL (ref 30.0–36.0)
MCV: 93.9 fL (ref 80.0–100.0)
Monocytes Absolute: 0.8 K/uL (ref 0.1–1.0)
Monocytes Relative: 9 %
Neutro Abs: 6.1 K/uL (ref 1.7–7.7)
Neutrophils Relative %: 66 %
Platelets: 172 K/uL (ref 150–400)
RBC: 3.45 MIL/uL — ABNORMAL LOW (ref 4.22–5.81)
RDW: 16.9 % — ABNORMAL HIGH (ref 11.5–15.5)
WBC: 9.2 K/uL (ref 4.0–10.5)
nRBC: 0 % (ref 0.0–0.2)

## 2023-11-10 LAB — ECHOCARDIOGRAM COMPLETE
Area-P 1/2: 5.02 cm2
Height: 68 in
S' Lateral: 2.6 cm
Weight: 1963.2 [oz_av]

## 2023-11-10 LAB — ETHANOL: Alcohol, Ethyl (B): <15 mg/dL (ref <15)

## 2023-11-10 LAB — TROPONIN I (HIGH SENSITIVITY)
Troponin I (High Sensitivity): 12 ng/L (ref <18)
Troponin I (High Sensitivity): 10 ng/L (ref <18)

## 2023-11-10 LAB — LIPASE, BLOOD: Lipase: 22 U/L (ref 11–51)

## 2023-11-10 LAB — MAGNESIUM: Magnesium: 1.2 mg/dL — ABNORMAL LOW (ref 1.7–2.4)

## 2023-11-10 MED ORDER — LIDOCAINE VISCOUS HCL 2 % MT SOLN
15.0000 mL | Freq: Two times a day (BID) | OROMUCOSAL | Status: DC | PRN
  Administered 2023-11-12 (×2): 15 mL via OROMUCOSAL
  Filled 2023-11-10 (×3): qty 15

## 2023-11-10 MED ORDER — LORAZEPAM 1 MG PO TABS
1.0000 mg | ORAL_TABLET | Freq: Once | ORAL | Status: AC
  Administered 2023-11-10: 1 mg via ORAL
  Filled 2023-11-10: qty 1

## 2023-11-10 MED ORDER — ACETAMINOPHEN 650 MG RE SUPP: 650.0000 mg | Freq: Four times a day (QID) | RECTAL | Status: DC | PRN

## 2023-11-10 MED ORDER — HEPARIN SODIUM (PORCINE) 5000 UNIT/ML IJ SOLN
5000.0000 [IU] | Freq: Three times a day (TID) | INTRAMUSCULAR | Status: DC
  Administered 2023-11-10 – 2023-11-12 (×7): 5000 [IU] via SUBCUTANEOUS
  Filled 2023-11-10 (×7): qty 1

## 2023-11-10 MED ORDER — POLYETHYLENE GLYCOL 3350 17 G PO PACK: 17.0000 g | PACK | Freq: Every day | ORAL | Status: DC | PRN

## 2023-11-10 MED ORDER — HYDROMORPHONE HCL 1 MG/ML IJ SOLN
1.0000 mg | INTRAMUSCULAR | Status: AC | PRN
  Administered 2023-11-10 – 2023-11-11 (×3): 1 mg via INTRAVENOUS
  Filled 2023-11-10 (×3): qty 1

## 2023-11-10 MED ORDER — SODIUM CHLORIDE 0.9% FLUSH
3.0000 mL | Freq: Two times a day (BID) | INTRAVENOUS | Status: DC
  Administered 2023-11-10 – 2023-11-13 (×6): 3 mL via INTRAVENOUS

## 2023-11-10 MED ORDER — ACETAMINOPHEN 325 MG PO TABS
650.0000 mg | ORAL_TABLET | Freq: Four times a day (QID) | ORAL | Status: DC | PRN
  Administered 2023-11-11: 650 mg via ORAL
  Filled 2023-11-10: qty 2

## 2023-11-10 MED ORDER — OXYCODONE HCL 5 MG PO TABS
5.0000 mg | ORAL_TABLET | ORAL | Status: DC | PRN
  Administered 2023-11-10 – 2023-11-11 (×4): 5 mg via ORAL
  Filled 2023-11-10 (×4): qty 1

## 2023-11-10 MED ORDER — CARVEDILOL 3.125 MG PO TABS
3.1250 mg | ORAL_TABLET | Freq: Two times a day (BID) | ORAL | Status: DC
  Administered 2023-11-10 – 2023-11-11 (×2): 3.125 mg via ORAL
  Filled 2023-11-10 (×2): qty 1

## 2023-11-10 MED ORDER — PANCRELIPASE (LIP-PROT-AMYL) 36000-114000 UNITS PO CPEP
72000.0000 [IU] | ORAL_CAPSULE | Freq: Three times a day (TID) | ORAL | Status: DC
  Administered 2023-11-10 – 2023-11-13 (×8): 72000 [IU] via ORAL
  Filled 2023-11-10 (×9): qty 2

## 2023-11-10 MED ORDER — SUCRALFATE 1 G PO TABS
1.0000 g | ORAL_TABLET | Freq: Three times a day (TID) | ORAL | Status: DC
  Administered 2023-11-10 – 2023-11-13 (×10): 1 g via ORAL
  Filled 2023-11-10 (×10): qty 1

## 2023-11-10 MED ORDER — OXYCODONE HCL 5 MG PO TABS
5.0000 mg | ORAL_TABLET | Freq: Once | ORAL | Status: AC
  Administered 2023-11-10: 5 mg via ORAL
  Filled 2023-11-10: qty 1

## 2023-11-10 MED ORDER — FAMOTIDINE 20 MG PO TABS
40.0000 mg | ORAL_TABLET | Freq: Every day | ORAL | Status: DC
  Administered 2023-11-11 – 2023-11-13 (×3): 40 mg via ORAL
  Filled 2023-11-10 (×4): qty 2

## 2023-11-10 MED ORDER — SODIUM CHLORIDE 0.9 % IV BOLUS
1000.0000 mL | Freq: Once | INTRAVENOUS | Status: AC
  Administered 2023-11-10: 1000 mL via INTRAVENOUS

## 2023-11-10 MED ORDER — PANTOPRAZOLE SODIUM 20 MG PO TBEC
20.0000 mg | DELAYED_RELEASE_TABLET | Freq: Every day | ORAL | Status: DC
  Administered 2023-11-11 – 2023-11-13 (×3): 20 mg via ORAL
  Filled 2023-11-10 (×4): qty 1

## 2023-11-10 MED ORDER — METOPROLOL SUCCINATE ER 100 MG PO TB24: 200.0000 mg | ORAL_TABLET | Freq: Every day | ORAL | Status: DC

## 2023-11-10 MED ORDER — NICOTINE 21 MG/24HR TD PT24
21.0000 mg | MEDICATED_PATCH | Freq: Every day | TRANSDERMAL | Status: DC
  Administered 2023-11-10 – 2023-11-13 (×4): 21 mg via TRANSDERMAL
  Filled 2023-11-10 (×4): qty 1

## 2023-11-10 MED ORDER — LORAZEPAM 2 MG/ML IJ SOLN
1.0000 mg | INTRAMUSCULAR | Status: AC
  Administered 2023-11-10 – 2023-11-11 (×4): 1 mg via INTRAVENOUS
  Filled 2023-11-10 (×4): qty 1

## 2023-11-10 MED ORDER — HYDROMORPHONE HCL 1 MG/ML IJ SOLN
1.0000 mg | Freq: Once | INTRAMUSCULAR | Status: AC
  Administered 2023-11-10: 1 mg via INTRAVENOUS
  Filled 2023-11-10: qty 1

## 2023-11-10 MED ORDER — PANCRELIPASE (LIP-PROT-AMYL) 36000-114000 UNITS PO CPEP
36000.0000 [IU] | ORAL_CAPSULE | ORAL | Status: DC | PRN
  Administered 2023-11-11 – 2023-11-12 (×3): 36000 [IU] via ORAL
  Filled 2023-11-10 (×4): qty 1

## 2023-11-10 NOTE — H&P (Signed)
 History and Physical   [name] FMW:[id number] DOB: [birth year] DOA: 11/10/2023  PCP: Celestia Rosaline SQUIBB, NP   Patient coming from: Home  Chief Complaint: Syncope and collapse  HPI: [name] is a 53 y.o. male with medical history significant of hypertension, GERD, NSVT, Wolff-Parkinson-White, paroxysmal atrial fibrillation, anemia, depression, substance use, alcohol  use, seizure disorder, chronic pancreatitis, ileus, AVM, cannabis hyperemesis syndrome presenting with chest pain and syncope.  Patient reports that he was walking earlier today had sudden loss of consciousness and woke up on the ground.  He thinks he was down for several minutes.  Reports some epigastric pain in the setting of a history of chronic pancreatitis.  Denies fevers, chills, shortness of breath, abdominal pain, constipation, diarrhea, nausea, vomiting.  ED Course: Vital signs in the ED notable for blood pressure in the 150s-180s systolic, heart rate in the 80s-100s.  Lab workup included CMP with bicarb 18, calcium  8.6, AST 54.  CBC showed hemoglobin stable at 10.7.  Troponin negative x 2.  Lipase normal.  Left wrist x-ray showed nondisplaced ulnar styloid fracture and probable distal radius fracture.  CT head showed no acute normality.  Patient received Dilaudid , oxycodone , 1 L IV fluids, Ativan , right arm splint in the ED.  Cardiology consulted with concern for high risk syncope given history of Wolff-Parkinson-White with possible history of ablation.  Cardiology agrees to see patient in consultation.  Review of Systems: As per HPI otherwise all other systems reviewed and are negative.  Past Medical History:  Diagnosis Date   Alcoholism /alcohol  abuse    Anemia    Anxiety    Arthritis    knees; arms; elbows (03/26/2015)   Asthma    Bipolar disorder (HCC)    Chronic bronchitis (HCC)    Chronic lower back pain    Chronic pancreatitis (HCC)    Cocaine abuse (HCC)    Depression     Family history of adverse reaction to anesthesia    Femoral condyle fracture (HCC) 03/08/2014   left medial/notes 03/09/2014   GERD (gastroesophageal reflux disease)    H/O hiatal hernia    H/O suicide attempt 10/2012   High cholesterol    History of blood transfusion 10/2012   when I tried to commit suicide   History of stomach ulcers    Hypertension    Marijuana abuse, continuous    Migraine    a few times/year (03/26/2015)   Pancreatitis    Pneumonia 1990's X 3   PTSD (post-traumatic stress disorder)    Seizures (HCC)    Sickle cell trait (HCC)    WPW (Wolff-Parkinson-White syndrome)    thelbert 03/06/2013    Past Surgical History:  Procedure Laterality Date   BIOPSY  11/25/2017   Procedure: BIOPSY;  Surgeon: Burnette Fallow, MD;  Location: [facility] ENDOSCOPY;  Service: Endoscopy;;   BIOPSY  10/14/2018   Procedure: BIOPSY;  Surgeon: Burnette Fallow, MD;  Location: [facility] ENDOSCOPY;  Service: Endoscopy;;   BIOPSY  03/06/2023   Procedure: BIOPSY;  Surgeon: Wilhelmenia Aloha Raddle., MD;  Location: [facility];  Service: Gastroenterology;;   CARDIAC CATHETERIZATION     CYST ENTEROSTOMY  01/02/2020   Procedure: CYST ASPIRATION;  Surgeon: Teressa Toribio SQUIBB, MD;  Location: [facility] ENDOSCOPY;  Service: Endoscopy;;   ESOPHAGOGASTRODUODENOSCOPY N/A 03/06/2023   Procedure: ESOPHAGOGASTRODUODENOSCOPY (EGD);  Surgeon: Wilhelmenia Aloha Raddle., MD;  Location: [facility];  Service: Gastroenterology;  Laterality: N/A;   ESOPHAGOGASTRODUODENOSCOPY (EGD) WITH PROPOFOL  N/A 11/25/2017   Procedure: ESOPHAGOGASTRODUODENOSCOPY (EGD) WITH PROPOFOL ;  Surgeon: Burnette Fallow, MD;  Location: [facility] ENDOSCOPY;  Service: Endoscopy;  Laterality: N/A;   ESOPHAGOGASTRODUODENOSCOPY (EGD) WITH PROPOFOL  Left 10/14/2018   Procedure: ESOPHAGOGASTRODUODENOSCOPY (EGD) WITH PROPOFOL ;  Surgeon: Burnette Fallow, MD;  Location: [facility] ENDOSCOPY;  Service: Endoscopy;  Laterality: Left;   ESOPHAGOGASTRODUODENOSCOPY (EGD) WITH PROPOFOL  N/A 11/14/2018    Procedure: ESOPHAGOGASTRODUODENOSCOPY (EGD) WITH PROPOFOL ;  Surgeon: Celestia Agent, MD;  Location: [facility] ENDOSCOPY;  Service: Gastroenterology;  Laterality: N/A;   ESOPHAGOGASTRODUODENOSCOPY (EGD) WITH PROPOFOL  N/A 01/02/2020   Procedure: ESOPHAGOGASTRODUODENOSCOPY (EGD) WITH PROPOFOL ;  Surgeon: Teressa Toribio SQUIBB, MD;  Location: [facility] ENDOSCOPY;  Service: Endoscopy;  Laterality: N/A;   ESOPHAGOGASTRODUODENOSCOPY (EGD) WITH PROPOFOL  N/A 10/25/2020   Procedure: ESOPHAGOGASTRODUODENOSCOPY (EGD) WITH PROPOFOL ;  Surgeon: Wilhelmenia Aloha Raddle., MD;  Location: [facility] ENDOSCOPY;  Service: Gastroenterology;  Laterality: N/A;   EUS N/A 01/02/2020   Procedure: UPPER ENDOSCOPIC ULTRASOUND (EUS) RADIAL;  Surgeon: Teressa Toribio SQUIBB, MD;  Location: [facility] ENDOSCOPY;  Service: Endoscopy;  Laterality: N/A;   EUS N/A 03/06/2023   Procedure: UPPER ENDOSCOPIC ULTRASOUND (EUS) RADIAL;  Surgeon: Wilhelmenia Aloha Raddle., MD;  Location: [facility] ENDOSCOPY;  Service: Gastroenterology;  Laterality: N/A;   EYE SURGERY Left 1990's   result of trauma    FACIAL FRACTURE SURGERY Left 1990's   result of trauma    FINE NEEDLE ASPIRATION N/A 03/06/2023   Procedure: FINE NEEDLE ASPIRATION (FNA) LINEAR;  Surgeon: Wilhelmenia Aloha Raddle., MD;  Location: [facility] ENDOSCOPY;  Service: Gastroenterology;  Laterality: N/A;   FLEXIBLE SIGMOIDOSCOPY N/A 10/25/2020   Procedure: FLEXIBLE SIGMOIDOSCOPY;  Surgeon: Wilhelmenia Aloha Raddle., MD;  Location: [facility] ENDOSCOPY;  Service: Gastroenterology;  Laterality: N/A;   FRACTURE SURGERY     HEMOSTASIS CLIP PLACEMENT  10/25/2020   Procedure: HEMOSTASIS CLIP PLACEMENT;  Surgeon: Wilhelmenia Aloha Raddle., MD;  Location: [facility] ENDOSCOPY;  Service: Gastroenterology;;   HERNIA REPAIR     HOT HEMOSTASIS N/A 03/06/2023   Procedure: HOT HEMOSTASIS (ARGON PLASMA COAGULATION/BICAP);  Surgeon: Wilhelmenia Aloha Raddle., MD;  Location: [facility];  Service: Gastroenterology;  Laterality: N/A;   LEFT HEART CATHETERIZATION WITH CORONARY  ANGIOGRAM Right 03/07/2013   Procedure: LEFT HEART CATHETERIZATION WITH CORONARY ANGIOGRAM;  Surgeon: Salena GORMAN Negri, MD;  Location: [facility] CATH LAB;  Service: Cardiovascular;  Laterality: Right;   UMBILICAL HERNIA REPAIR     UPPER GASTROINTESTINAL ENDOSCOPY      Social History  reports that he has been smoking cigarettes and e-cigarettes. He has a 36 pack-year smoking history. He has never used smokeless tobacco. He reports that he does not currently use alcohol  after a past usage of about 4.0 standard drinks of alcohol  per week. He reports current drug use. Drugs: Marijuana and Cocaine.  Allergies  Allergen Reactions   Robaxin  [Methocarbamol ] Other (See Comments)    jumpy limbs   Aspirin  Other (See Comments)    Unknown reaction   Shellfish-Derived Products Nausea And Vomiting and Rash   Trazodone  And Nefazodone Other (See Comments)    Muscle spasms   Adhesive [Tape] Itching   Fish-Derived Products    Latex Itching   Toradol  [Ketorolac  Tromethamine ] Other (See Comments)    Has ulcers; cannot have this   Contrast Media [Iodinated Contrast Media] Hives   Reglan  [Metoclopramide ] Other (See Comments)    Muscle spasms   Salmon [Fish Oil] Nausea And Vomiting and Rash    Family History  Problem Relation Age of Onset   Hypertension Mother    Cirrhosis Father    Alcoholism Father    Hypertension Father    Melanoma  Father    Hypertension Other    Coronary artery disease Other   Reviewed on admission  Prior to Admission medications   Medication Sig Start Date End Date Taking? Authorizing Provider  acetaminophen  (TYLENOL ) 500 MG tablet Take 1,000 mg by mouth daily as needed for mild pain (pain score 1-3) or moderate pain (pain score 4-6).    [provider]  calcium -vitamin D  (OSCAL WITH D) 500-5 MG-MCG tablet Take 1 tablet by mouth daily with breakfast. 09/18/23   Yolande Lamar BROCKS, MD  dicyclomine  (BENTYL ) 20 MG tablet Take 1 tablet (20 mg total) by mouth daily as needed for  spasms. Patient not taking: Reported on 08/26/2023 08/21/23   Raenelle Donalda HERO, MD  famotidine  (PEPCID ) 40 MG tablet Take 1 tablet (40 mg total) by mouth daily. 10/17/23   Henderly, Britni A, PA-C  fluticasone  (FLONASE ) 50 MCG/ACT nasal spray Place 2 sprays into both nostrils daily as needed for allergies.    [provider]  folic acid  (FOLVITE ) 1 MG tablet Take 1 tablet (1 mg total) by mouth daily. Patient not taking: Reported on 09/18/2023 08/21/23   Raenelle Donalda HERO, MD  lidocaine  (XYLOCAINE ) 2 % solution Use as directed 15 mLs in the mouth or throat every 12 (twelve) hours as needed. 11/07/23   Hoy Nidia FALCON, PA-C  lipase/protease/amylase (CREON ) 36000 UNITS CPEP capsule Take 2 capsules (72,000 Units total) by mouth 3 (three) times daily with meals. May also take 1 capsule (36,000 Units total) as needed (with snacks). 08/21/23   Ghimire, Donalda HERO, MD  loperamide  (IMODIUM ) 2 MG capsule Take 2 capsules (4 mg total) by mouth as needed for diarrhea or loose stools. 09/25/23   Mesner, Selinda, MD  magnesium  oxide (MAG-OX) 400 (240 Mg) MG tablet Take 1 tablet (400 mg total) by mouth daily. 11/07/23   Hoy Nidia FALCON, PA-C  metoprolol  (TOPROL -XL) 200 MG 24 hr tablet Take 1 tablet (200 mg total) by mouth daily. 08/21/23   Ghimire, Donalda HERO, MD  Multiple Vitamin (MULTIVITAMIN WITH MINERALS) TABS tablet Take 1 tablet by mouth daily. 08/21/23   Ghimire, Donalda HERO, MD  ondansetron  (ZOFRAN ) 4 MG tablet Take 1 tablet (4 mg total) by mouth every 6 (six) hours. 10/17/23   Henderly, Britni A, PA-C  ondansetron  (ZOFRAN -ODT) 4 MG disintegrating tablet Take 1 tablet (4 mg total) by mouth daily as needed for nausea or vomiting. 10/22/23   Horton, Charmaine FALCON, MD  oxyCODONE  (ROXICODONE ) 5 MG immediate release tablet Take 1 tablet (5 mg total) by mouth every 4 (four) hours as needed for up to 5 doses. 11/07/23   Hoy Nidia FALCON, PA-C  pantoprazole  (PROTONIX ) 20 MG tablet Take 1 tablet (20 mg total) by mouth  daily. 11/07/23   Hoy Nidia FALCON, PA-C  psyllium (HYDROCIL/METAMUCIL) 95 % PACK Take 1 packet by mouth daily. Patient not taking: Reported on 09/18/2023 08/21/23   Raenelle Donalda HERO, MD  sodium zirconium cyclosilicate  (LOKELMA ) 10 g PACK packet Take 10 g by mouth 2 (two) times daily. Patient not taking: Reported on 08/26/2023 08/21/23   Raenelle Donalda HERO, MD  sucralfate  (CARAFATE ) 1 g tablet Take 1 tablet (1 g total) by mouth in the morning, at noon, and at bedtime. 11/07/23   Hoy Nidia FALCON, PA-C  tamsulosin  (FLOMAX ) 0.4 MG CAPS capsule Take 1 capsule (0.4 mg total) by mouth daily. 08/21/23   Ghimire, Donalda HERO, MD  thiamine  (VITAMIN B1) 100 MG tablet Take 1 tablet (100 mg total) by mouth daily. 08/21/23  Ghimire, Donalda HERO, MD  amitriptyline  (ELAVIL ) 25 MG tablet Take 1 tablet (25 mg total) by mouth at bedtime. Patient not taking: Reported on 08/08/2019 10/15/18 08/08/19  Tobie Yetta HERO, MD    Physical Exam: Vitals:   11/10/23 1200 11/10/23 1215 11/10/23 1330 11/10/23 1333  BP: (!) 185/113  (!) 156/101   Pulse: (!) 105 95 84   Resp: 15 (!) 21 15   Temp:    97.6 F (36.4 C)  TempSrc:    Oral  SpO2: 100% 100% 100%   Weight:      Height:        Physical Exam Constitutional:      General: He is not in acute distress.    Appearance: Normal appearance.  HENT:     Head: Normocephalic and atraumatic.     Mouth/Throat:     Mouth: Mucous membranes are moist.     Pharynx: Oropharynx is clear.  Eyes:     Extraocular Movements: Extraocular movements intact.     Pupils: Pupils are equal, round, and reactive to light.  Cardiovascular:     Rate and Rhythm: Normal rate and regular rhythm.     Pulses: Normal pulses.     Heart sounds: Normal heart sounds.  Pulmonary:     Effort: Pulmonary effort is normal. No respiratory distress.     Breath sounds: Normal breath sounds.  Abdominal:     General: Bowel sounds are normal. There is no distension.     Palpations: Abdomen is soft.      Tenderness: There is no abdominal tenderness.  Musculoskeletal:        General: No swelling or deformity.  Skin:    General: Skin is warm and dry.  Neurological:     General: No focal deficit present.     Mental Status: Mental status is at baseline.    Labs on Admission: I have personally reviewed following labs and imaging studies  CBC: Recent Labs  Lab 11/06/23 2341 11/10/23 1115  WBC 6.5 9.2  NEUTROABS  --  6.1  HGB 9.9* 10.7*  HCT 30.4* 32.4*  MCV 90.7 93.9  PLT 203 172    Basic Metabolic Panel: Recent Labs  Lab 11/06/23 2341 11/10/23 1115  NA 136 137  K 3.6 4.4  CL 107 107  CO2 18* 18*  GLUCOSE 96 83  BUN 7 12  CREATININE 0.73 0.96  CALCIUM  8.1* 8.6*    GFR: Estimated Creatinine Clearance: 69.9 mL/min (by C-G formula based on SCr of 0.96 mg/dL).  Liver Function Tests: Recent Labs  Lab 11/06/23 2341 11/10/23 1115  AST 58* 54*  ALT 36 25  ALKPHOS 66 68  BILITOT 0.7 0.9  PROT 6.6 6.9  ALBUMIN  3.7 3.5    Urine analysis:    Component Value Date/Time   COLORURINE COLORLESS (A) 11/06/2023 2346   APPEARANCEUR CLEAR 11/06/2023 2346   LABSPEC 1.002 (L) 11/06/2023 2346   PHURINE 6.0 11/06/2023 2346   GLUCOSEU NEGATIVE 11/06/2023 2346   HGBUR NEGATIVE 11/06/2023 2346   HGBUR negative 04/30/2010 1020   BILIRUBINUR NEGATIVE 11/06/2023 2346   BILIRUBINUR negative 09/06/2019 1649   KETONESUR NEGATIVE 11/06/2023 2346   PROTEINUR NEGATIVE 11/06/2023 2346   UROBILINOGEN 0.2 09/06/2019 1649   UROBILINOGEN 0.2 03/15/2015 0508   NITRITE NEGATIVE 11/06/2023 2346   LEUKOCYTESUR NEGATIVE 11/06/2023 2346    Radiological Exams on Admission: CT Head Wo Contrast Result Date: 11/10/2023 CLINICAL DATA:  Ataxia.  Head trauma. EXAM: CT HEAD WITHOUT CONTRAST TECHNIQUE: Contiguous  axial images were obtained from the base of the skull through the vertex without intravenous contrast. RADIATION DOSE REDUCTION: This exam was performed according to the departmental  dose-optimization program which includes automated exposure control, adjustment of the mA and/or kV according to patient size and/or use of iterative reconstruction technique. COMPARISON:  CT 10/19/2023 FINDINGS: Brain: No acute intracranial hemorrhage. No focal mass lesion. No CT evidence of acute infarction. No midline shift or mass effect. No hydrocephalus. Basilar cisterns are patent. Vascular: No hyperdense vessel or unexpected calcification. Skull: Normal. Negative for fracture or focal lesion. Sinuses/Orbits: Paranasal sinuses and mastoid air cells are clear. Orbits are clear. Postsurgical hardware in the medial LEFT orbit. Other: None. IMPRESSION: 1. No acute intracranial findings. 2. Postsurgical changes in the LEFT orbit. Electronically Signed   By: Jackquline Boxer M.D.   On: 11/10/2023 11:57   DG Wrist Complete Left Result Date: 11/10/2023 CLINICAL DATA:  Unwitnessed fall.  Left wrist pain and deformity. EXAM: LEFT WRIST - COMPLETE 3+ VIEW COMPARISON:  None Available. FINDINGS: Three views are submitted. The mineralization is adequate. There is a nondisplaced ulnar styloid fracture. Probable nondisplaced fracture of the distal radius with mild dorsal cortical irregularity. No evidence of dislocation or carpal bone fracture. IV tubing in place. Moderate soft tissue swelling around the wrist without evidence of unexpected foreign body or soft tissue emphysema. IMPRESSION: Nondisplaced ulnar styloid fracture. Probable nondisplaced distal radius fracture. No dislocation. Electronically Signed   By: Elsie Perone M.D.   On: 11/10/2023 11:43   EKG: Independently reviewed. Sinus tachycardia at 100 bpm.  Possible Delta wave/short PR in the setting of patient's history of Wolff-Parkinson-White  Assessment/Plan Active Problems:   Paroxysmal atrial fibrillation (HCC)   Alcohol  abuse   Essential hypertension   Polysubstance abuse (tobacco, cocaine, THC, and ETOH)   Chronic pancreatitis (HCC)   Major  depressive disorder, recurrent severe without psychotic features (HCC)   Normocytic anemia   GERD (gastroesophageal reflux disease)   Seizure (HCC)   WPW (Wolff-Parkinson-White syndrome)   NSVT (nonsustained ventricular tachycardia) (HCC)   Syncope and collapse History of WPW > Patient reports that he was walking and suddenly lost consciousness and awoke on the ground several minutes later. > History of prior high risk syncope and WPW.  Notes indicate history of ablation but I cannot locate the procedure/date when this was done on initial chart review; patient did confirm that this was done at [facility] around 9-10 years ago. > Cardiology consulted in the ED and will see the patient. - Monitor on telemetry overnight - Echocardiogram - Orthostatic vital signs - Check magnesium  - Supportive care  Hypertension - Holding metoprolol  for now  GERD - Continue home PPI, Pepcid   Chronic pancreatitis - Continue home Creon   Wrist fracture > After syncopal episode left wrist x-ray showed nondisplaced ulnar styloid fracture and probable nondisplaced distal radial fracture. > Already splinted in the ED.  Chronic pain - Continue home oxycodone  - A few doses of Dilaudid  for breakthrough pain given some increased pain with recent fall and wrist fracture  Paroxysmal A-fib - Holding metoprolol  - Not on anticoagulation  Anemia - Hemoglobin stable at 10.7 - Trend CBC  Depression - Not currently on medication for this  Substance use - Including cocaine, THC, alcohol , tobacco - Noted  History of seizure - Not currently on any antiepileptics  DVT prophylaxis: Heparin  Code Status:   Full Family Communication:  None on admission  Disposition Plan:   Patient is from:  Home  Anticipated DC to:  Home  Anticipated DC date:  1 to 3 days  Anticipated DC barriers: None  Consults called:  Cardiology Admission status:  Observation, telemetry  Severity of Illness: The appropriate  patient status for this patient is OBSERVATION. Observation status is judged to be reasonable and necessary in order to provide the required intensity of service to ensure the patient's safety. The patient's presenting symptoms, physical exam findings, and initial radiographic and laboratory data in the context of their medical condition is felt to place them at decreased risk for further clinical deterioration. Furthermore, it is anticipated that the patient will be medically stable for discharge from the hospital within 2 midnights of admission.    Marsa KATHEE Scurry MD [facility]  How to contact the [facility] Attending or Consulting provider 7A - 7P or covering provider during after hours 7P -7A, for this patient?   Check the care team in [facility] and look for a) attending/consulting [facility] provider listed and b) the [facility] team listed Log into [email or web address] and use [facility] universal password to access. If you do not have the password, please contact the hospital operator. Locate the [facility] provider you are looking for under [facility] and page to a number that you can be directly reached. If you still have difficulty reaching the provider, please page the [facility] (Director on Call) for the Hospitalists listed on amion for assistance.  11/10/2023, 2:06 PM

## 2023-11-10 NOTE — ED Provider Notes (Signed)
 [place] EMERGENCY DEPARTMENT AT [facility] Provider Note  CSN: [id number] Arrival date & time: 11/10/23 1104  Chief Complaint(s) Loss of Consciousness and Chest Pain  HPI [name] is a 53 y.o. male presenting to the emergency room today due to a syncopal episode.  Patient has a history of Wolff-Parkinson-White disease, history of chronic pancreatitis, history of polysubstance abuse.  States that he was walking today, lost consciousness and woke up on the ground.  Thinks that he was down there for several minutes.  This endorsing some epigastric pain at this time.  No shortness of breath.   Past Medical History Past Medical History:  Diagnosis Date   Alcoholism /alcohol  abuse    Anemia    Anxiety    Arthritis    knees; arms; elbows (03/26/2015)   Asthma    Bipolar disorder (HCC)    Chronic bronchitis (HCC)    Chronic lower back pain    Chronic pancreatitis (HCC)    Cocaine abuse (HCC)    Depression    Family history of adverse reaction to anesthesia    Femoral condyle fracture (HCC) 03/08/2014   left medial/notes 03/09/2014   GERD (gastroesophageal reflux disease)    H/O hiatal hernia    H/O suicide attempt 10/2012   High cholesterol    History of blood transfusion 10/2012   when I tried to commit suicide   History of stomach ulcers    Hypertension    Marijuana abuse, continuous    Migraine    a few times/year (03/26/2015)   Pancreatitis    Pneumonia 1990's X 3   PTSD (post-traumatic stress disorder)    Seizures (HCC)    Sickle cell trait (HCC)    WPW (Wolff-Parkinson-White syndrome)    thelbert 03/06/2013   Patient Active Problem List   Diagnosis Date Noted   Intractable diarrhea 08/10/2023   Intractable nausea and vomiting 06/18/2023   History of peptic ulcer 03/07/2023   Lumbosacral radiculopathy 03/07/2023   Duodenitis 03/06/2023   Gastric AVM 03/06/2023   AVM (arteriovenous malformation) of small bowel, 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    Hyperkalemia 03/03/2023   Acute urinary retention 03/01/2023   Intractable pain 02/09/2023   Metabolic acidosis, increased anion gap 02/08/2023   Cocaine abuse (HCC) 02/08/2023   Adynamic ileus (HCC) 01/25/2023   Cannabinoid hyperemesis syndrome 01/25/2023   Epigastric abdominal pain 01/09/2023   Asthma, chronic 01/09/2023   Ileus (HCC) 01/09/2023   Hypokalemia 09/18/2022   NSVT (nonsustained ventricular tachycardia) (HCC) 01/20/2022   Normal anion gap metabolic acidosis 12/08/2021   Hypoalbuminemia 12/08/2021   Acute recurrent pancreatitis 12/07/2021   WPW (Wolff-Parkinson-White syndrome) 12/06/2021   Paroxysmal atrial fibrillation (HCC) 12/06/2021   Pulmonary nodule 12/06/2021   Leukocytosis 06/07/2021   Epigastric pain 06/07/2021   Acute alcoholic pancreatitis 06/06/2021   Urinary retention    Protein-calorie malnutrition, severe 11/17/2020   Acute pancreatitis 11/15/2020   Hypomagnesemia    Hypocalcemia 10/23/2020   AKI (acute kidney injury) (HCC) 11/13/2018   Seizure (HCC) 11/13/2018   Chest pain 01/08/2018   Acute on chronic pancreatitis (HCC) 09/28/2017   Abdominal pain 05/27/2017   Tachycardia 03/18/2017   Nausea, vomiting, and diarrhea 03/18/2017   Diarrhea 03/18/2017   GERD (gastroesophageal reflux disease)    Alcohol  use disorder 12/05/2016   Normocytic anemia 12/05/2016   Alcohol  use disorder, severe, dependence (HCC) 07/25/2016   Cocaine use disorder, severe, dependence (HCC) 07/25/2016   Major depressive disorder, 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 Authorizing Provider  acetaminophen  (TYLENOL ) 500 MG tablet Take 1,000 mg by mouth daily as needed for mild pain (pain score 1-3) or moderate pain (pain score 4-6).    [provider]  calcium -vitamin D  (OSCAL WITH D) 500-5 MG-MCG tablet Take 1 tablet by mouth daily with breakfast. 09/18/23   Yolande Lamar BROCKS, MD  dicyclomine  (BENTYL ) 20 MG tablet Take 1 tablet (20 mg total) by mouth daily as needed for spasms. Patient not taking: Reported on 08/26/2023 08/21/23   Raenelle Donalda HERO, MD  famotidine  (PEPCID ) 40 MG tablet Take 1 tablet (40 mg total) by mouth daily. 10/17/23   Henderly, Britni A, PA-C  fluticasone  (FLONASE ) 50 MCG/ACT nasal spray Place 2 sprays into both nostrils daily as needed for allergies.    [provider]  folic acid  (FOLVITE ) 1 MG tablet Take 1 tablet (1 mg total) by mouth daily. Patient not taking: Reported on 09/18/2023 08/21/23   Raenelle Donalda HERO, MD  lidocaine  (XYLOCAINE ) 2 % solution Use as directed 15 mLs in the mouth or throat every 12 (twelve) hours as needed. 11/07/23   Hoy Nidia FALCON, PA-C  lipase/protease/amylase (CREON ) 36000 UNITS CPEP capsule Take 2 capsules (72,000 Units total) by mouth 3 (three) times daily with meals. May also take 1 capsule (36,000 Units total) as needed (with snacks). 08/21/23   Ghimire, Donalda HERO, MD  loperamide  (IMODIUM ) 2 MG capsule Take 2 capsules (4 mg total) by mouth as needed for diarrhea or loose stools. 09/25/23   Mesner, Selinda, MD  magnesium  oxide (MAG-OX) 400 (240 Mg) MG tablet Take 1 tablet (400 mg total) by mouth daily. 11/07/23   Hoy Nidia FALCON, PA-C  metoprolol  (TOPROL -XL) 200 MG 24 hr tablet Take 1 tablet (200 mg total) by mouth daily. 08/21/23   Ghimire, Donalda HERO, MD  Multiple Vitamin (MULTIVITAMIN WITH MINERALS) TABS tablet Take 1 tablet by mouth daily. 08/21/23   Ghimire, Donalda HERO, MD  ondansetron  (ZOFRAN ) 4 MG tablet Take 1 tablet (4 mg total) by  mouth every 6 (six) hours. 10/17/23   Henderly, Britni A, PA-C  ondansetron  (ZOFRAN -ODT) 4 MG disintegrating tablet Take 1 tablet (4 mg total) by mouth daily as needed for nausea or vomiting. 10/22/23   Horton, Charmaine FALCON, MD  oxyCODONE  (ROXICODONE ) 5 MG immediate release tablet Take 1 tablet (5 mg total) by mouth every 4 (four) hours as needed for up to 5 doses. 11/07/23   Hoy Nidia FALCON, PA-C  pantoprazole  (PROTONIX ) 20 MG tablet Take 1 tablet (20 mg total) by mouth daily. 11/07/23   Hoy Nidia FALCON, PA-C  psyllium (HYDROCIL/METAMUCIL) 95 % PACK Take 1 packet by mouth daily. Patient not taking: Reported on 09/18/2023 08/21/23   Raenelle Donalda HERO, MD  sodium zirconium cyclosilicate  (LOKELMA ) 10 g PACK packet Take 10 g by mouth 2 (two) times daily. Patient not taking: Reported on 08/26/2023 08/21/23   Raenelle Donalda HERO, MD  sucralfate  (CARAFATE ) 1 g tablet Take 1 tablet (1 g total) by mouth in the morning, at noon, and at bedtime. 11/07/23   Hoy Nidia FALCON, PA-C  tamsulosin  (FLOMAX ) 0.4 MG  CAPS capsule Take 1 capsule (0.4 mg total) by mouth daily. 08/21/23   Ghimire, Donalda HERO, MD  thiamine  (VITAMIN B1) 100 MG tablet Take 1 tablet (100 mg total) by mouth daily. 08/21/23   Ghimire, Donalda HERO, MD  amitriptyline  (ELAVIL ) 25 MG tablet Take 1 tablet (25 mg total) by mouth at bedtime. Patient not taking: Reported on 08/08/2019 10/15/18 08/08/19  Tobie Yetta HERO, MD                                                                                                                                    Past Surgical History Past Surgical History:  Procedure Laterality Date   BIOPSY  11/25/2017   Procedure: BIOPSY;  Surgeon: Burnette Fallow, MD;  Location: [facility] ENDOSCOPY;  Service: Endoscopy;;   BIOPSY  10/14/2018   Procedure: BIOPSY;  Surgeon: Burnette Fallow, MD;  Location: [facility] ENDOSCOPY;  Service: Endoscopy;;   BIOPSY  03/06/2023   Procedure: BIOPSY;  Surgeon: Wilhelmenia Aloha Raddle., MD;  Location: [facility] ENDOSCOPY;   Service: Gastroenterology;;   CARDIAC CATHETERIZATION     CYST ENTEROSTOMY  01/02/2020   Procedure: CYST ASPIRATION;  Surgeon: Teressa Toribio SQUIBB, MD;  Location: [facility] ENDOSCOPY;  Service: Endoscopy;;   ESOPHAGOGASTRODUODENOSCOPY N/A 03/06/2023   Procedure: ESOPHAGOGASTRODUODENOSCOPY (EGD);  Surgeon: Wilhelmenia Aloha Raddle., MD;  Location: [facility] ENDOSCOPY;  Service: Gastroenterology;  Laterality: N/A;   ESOPHAGOGASTRODUODENOSCOPY (EGD) WITH PROPOFOL  N/A 11/25/2017   Procedure: ESOPHAGOGASTRODUODENOSCOPY (EGD) WITH PROPOFOL ;  Surgeon: Burnette Fallow, MD;  Location: [facility] ENDOSCOPY;  Service: Endoscopy;  Laterality: N/A;   ESOPHAGOGASTRODUODENOSCOPY (EGD) WITH PROPOFOL  Left 10/14/2018   Procedure: ESOPHAGOGASTRODUODENOSCOPY (EGD) WITH PROPOFOL ;  Surgeon: Burnette Fallow, MD;  Location: [facility] ENDOSCOPY;  Service: Endoscopy;  Laterality: Left;   ESOPHAGOGASTRODUODENOSCOPY (EGD) WITH PROPOFOL  N/A 11/14/2018   Procedure: ESOPHAGOGASTRODUODENOSCOPY (EGD) WITH PROPOFOL ;  Surgeon: Celestia Agent, MD;  Location: [facility] ENDOSCOPY;  Service: Gastroenterology;  Laterality: N/A;   ESOPHAGOGASTRODUODENOSCOPY (EGD) WITH PROPOFOL  N/A 01/02/2020   Procedure: ESOPHAGOGASTRODUODENOSCOPY (EGD) WITH PROPOFOL ;  Surgeon: Teressa Toribio SQUIBB, MD;  Location: [facility] ENDOSCOPY;  Service: Endoscopy;  Laterality: N/A;   ESOPHAGOGASTRODUODENOSCOPY (EGD) WITH PROPOFOL  N/A 10/25/2020   Procedure: ESOPHAGOGASTRODUODENOSCOPY (EGD) WITH PROPOFOL ;  Surgeon: Wilhelmenia Aloha Raddle., MD;  Location: [facility] ENDOSCOPY;  Service: Gastroenterology;  Laterality: N/A;   EUS N/A 01/02/2020   Procedure: UPPER ENDOSCOPIC ULTRASOUND (EUS) RADIAL;  Surgeon: Teressa Toribio SQUIBB, MD;  Location: [facility] ENDOSCOPY;  Service: Endoscopy;  Laterality: N/A;   EUS N/A 03/06/2023   Procedure: UPPER ENDOSCOPIC ULTRASOUND (EUS) RADIAL;  Surgeon: Wilhelmenia Aloha Raddle., MD;  Location: [facility] ENDOSCOPY;  Service: Gastroenterology;  Laterality: N/A;   EYE SURGERY Left 1990's   result of trauma    FACIAL  FRACTURE SURGERY Left 1990's   result of trauma    FINE NEEDLE ASPIRATION N/A 03/06/2023   Procedure: FINE NEEDLE ASPIRATION (FNA) LINEAR;  Surgeon: Wilhelmenia Aloha Raddle., MD;  Location: [facility] ENDOSCOPY;  Service: Gastroenterology;  Laterality: N/A;  FLEXIBLE SIGMOIDOSCOPY N/A 10/25/2020   Procedure: FLEXIBLE SIGMOIDOSCOPY;  Surgeon: Wilhelmenia Aloha Raddle., MD;  Location: [facility] ENDOSCOPY;  Service: Gastroenterology;  Laterality: N/A;   FRACTURE SURGERY     HEMOSTASIS CLIP PLACEMENT  10/25/2020   Procedure: HEMOSTASIS CLIP PLACEMENT;  Surgeon: Wilhelmenia Aloha Raddle., MD;  Location: [facility] ENDOSCOPY;  Service: Gastroenterology;;   HERNIA REPAIR     HOT HEMOSTASIS N/A 03/06/2023   Procedure: HOT HEMOSTASIS (ARGON PLASMA COAGULATION/BICAP);  Surgeon: Wilhelmenia Aloha Raddle., MD;  Location: [facility] ENDOSCOPY;  Service: Gastroenterology;  Laterality: N/A;   LEFT HEART CATHETERIZATION WITH CORONARY ANGIOGRAM Right 03/07/2013   Procedure: LEFT HEART CATHETERIZATION WITH CORONARY ANGIOGRAM;  Surgeon: Salena GORMAN Negri, MD;  Location: [facility] CATH LAB;  Service: Cardiovascular;  Laterality: Right;   UMBILICAL HERNIA REPAIR     UPPER GASTROINTESTINAL ENDOSCOPY     Family History Family History  Problem Relation Age of Onset   Hypertension Mother    Cirrhosis Father    Alcoholism Father    Hypertension Father    Melanoma Father    Hypertension Other    Coronary artery disease Other     Social History Social History   Tobacco Use   Smoking status: Every Day    Current packs/day: 1.00    Average packs/day: 1 pack/day for 36.0 years (36.0 ttl pk-yrs)    Types: Cigarettes, E-cigarettes   Smokeless tobacco: Never  Vaping Use   Vaping status: Former  Substance Use Topics   Alcohol  use: Not Currently    Alcohol /week: 4.0 standard drinks of alcohol     Types: 4 Cans of beer per week   Drug use: Yes    Types: Marijuana, Cocaine    Comment: Last used cocaine a week ago   Allergies Robaxin  [methocarbamol ],  Aspirin , Shellfish-derived products, Trazodone  and nefazodone, Adhesive [tape], Fish-derived products, Latex, Toradol  [ketorolac  tromethamine ], Contrast media [iodinated contrast media], Reglan  [metoclopramide ], and Garnell gums oil]  Review of Systems Review of Systems  Physical Exam Vital Signs  I have reviewed the triage vital signs BP (!) 156/101   Pulse 84   Temp 97.6 F (36.4 C) (Oral)   Resp 15   Ht 5' 8 (1.727 m)   Wt 56.2 kg   SpO2 100%   BMI 18.85 kg/m   Physical Exam Vitals and nursing note reviewed.  HENT:     Head: Normocephalic.  Cardiovascular:     Rate and Rhythm: Normal rate.     Pulses:          Carotid pulses are 2+ on the right side and 2+ on the left side.      Radial pulses are 2+ on the right side and 2+ on the left side.       Dorsalis pedis pulses are 2+ on the right side and 2+ on the left side.       Posterior tibial pulses are 2+ on the right side and 2+ on the left side.     Heart sounds: Normal heart sounds.  Pulmonary:     Effort: Pulmonary effort is normal.  Chest:     Chest wall: No mass or tenderness.  Abdominal:     Palpations: Abdomen is soft.  Musculoskeletal:        General: Normal range of motion.  Neurological:     Mental Status: He is alert.     ED Results and Treatments Labs (all labs ordered are listed, but only abnormal results are displayed) Labs Reviewed  COMPREHENSIVE METABOLIC PANEL WITH GFR -  Abnormal; Notable for the following components:      Result Value   CO2 18 (*)    Calcium  8.6 (*)    AST 54 (*)    All other components within normal limits  CBC WITH DIFFERENTIAL/PLATELET - Abnormal; Notable for the following components:   RBC 3.45 (*)    Hemoglobin 10.7 (*)    HCT 32.4 (*)    RDW 16.9 (*)    All other components within normal limits  LIPASE, BLOOD  TROPONIN I (HIGH SENSITIVITY)  TROPONIN I (HIGH SENSITIVITY)                                                                                                                           Radiology CT Head Wo Contrast Result Date: 11/10/2023 CLINICAL DATA:  Ataxia.  Head trauma. EXAM: CT HEAD WITHOUT CONTRAST TECHNIQUE: Contiguous axial images were obtained from the base of the skull through the vertex without intravenous contrast. RADIATION DOSE REDUCTION: This exam was performed according to the departmental dose-optimization program which includes automated exposure control, adjustment of the mA and/or kV according to patient size and/or use of iterative reconstruction technique. COMPARISON:  CT 10/19/2023 FINDINGS: Brain: No acute intracranial hemorrhage. No focal mass lesion. No CT evidence of acute infarction. No midline shift or mass effect. No hydrocephalus. Basilar cisterns are patent. Vascular: No hyperdense vessel or unexpected calcification. Skull: Normal. Negative for fracture or focal lesion. Sinuses/Orbits: Paranasal sinuses and mastoid air cells are clear. Orbits are clear. Postsurgical hardware in the medial LEFT orbit. Other: None. IMPRESSION: 1. No acute intracranial findings. 2. Postsurgical changes in the LEFT orbit. Electronically Signed   By: Jackquline Boxer M.D.   On: 11/10/2023 11:57   DG Wrist Complete Left Result Date: 11/10/2023 CLINICAL DATA:  Unwitnessed fall.  Left wrist pain and deformity. EXAM: LEFT WRIST - COMPLETE 3+ VIEW COMPARISON:  None Available. FINDINGS: Three views are submitted. The mineralization is adequate. There is a nondisplaced ulnar styloid fracture. Probable nondisplaced fracture of the distal radius with mild dorsal cortical irregularity. No evidence of dislocation or carpal bone fracture. IV tubing in place. Moderate soft tissue swelling around the wrist without evidence of unexpected foreign body or soft tissue emphysema. IMPRESSION: Nondisplaced ulnar styloid fracture. Probable nondisplaced distal radius fracture. No dislocation. Electronically Signed   By: Elsie Perone M.D.   On: 11/10/2023 11:43     Pertinent labs & imaging results that were available during my care of the patient were reviewed by me and considered in my medical decision making (see MDM for details).  Medications Ordered in ED Medications  oxyCODONE  (Oxy IR/ROXICODONE ) immediate release tablet 5 mg (has no administration in time range)  HYDROmorphone  (DILAUDID ) injection 1 mg (1 mg Intravenous Given 11/10/23 1135)  LORazepam  (ATIVAN ) tablet 1 mg (1 mg Oral Given 11/10/23 1159)  sodium chloride  0.9 % bolus 1,000 mL (1,000 mLs Intravenous New Bag/Given 11/10/23 1227)  Procedures Procedures  (including critical care time)  Medical Decision Making / ED Course   This patient presents to the ED for concern of syncope, epigastric pain, this involves an extensive number of treatment options, and is a complaint that carries with it a high risk of complications and morbidity.  The differential diagnosis includes pancreatitis, syncope, ACS, considered aortic dissection, less likely PE, polysubstance use.  MDM: Patient hypertensive on exam.  His EKG does not show acute ischemia, however does show a very short PR interval consistent with his WPW.  I considered dissection in this patient, however he describes more of an epigastric pain consistent with his chronic pancreatitis rather than any ripping or tearing sensation.  He has equal pulses in all extremities.  Will give him some Ativan  for blood pressure control as I believe there is a component of his cocaine use contributing to his symptoms.  I provided some medications for his pancreatitis.  Will hold off on CT imaging at this time given his multiple CT images including his CT scan 3 days ago.  With the patient's syncope, he likely require admission given his high risk due to his WPW.  Reassessment 1:45 PM-patient's blood pressure has improved.   Initial troponin 12, not significantly elevated.  Spoke with on-call cardiology physician Dr. Mallipeddi who agreed to consult on the patient following admission.  Will admit patient to hospitalist.   Additional history obtained:  -External records from outside source obtained and reviewed including: Chart review including previous notes, labs, imaging, consultation notes   Lab Tests: -I ordered, reviewed, and interpreted labs.   The pertinent results include:   Labs Reviewed  COMPREHENSIVE METABOLIC PANEL WITH GFR - Abnormal; Notable for the following components:      Result Value   CO2 18 (*)    Calcium  8.6 (*)    AST 54 (*)    All other components within normal limits  CBC WITH DIFFERENTIAL/PLATELET - Abnormal; Notable for the following components:   RBC 3.45 (*)    Hemoglobin 10.7 (*)    HCT 32.4 (*)    RDW 16.9 (*)    All other components within normal limits  LIPASE, BLOOD  TROPONIN I (HIGH SENSITIVITY)  TROPONIN I (HIGH SENSITIVITY)      EKG my independent review of the patient's EKG shows no ST segment depressions or elevations, no T wave inversions, no evidence of acute ischemia..  Short PR interval  EKG Interpretation Date/Time:    Ventricular Rate:    PR Interval:    QRS Duration:    QT Interval:    QTC Calculation:   R Axis:      Text Interpretation:           Imaging Studies ordered: I ordered imaging studies including head CT, chest x-ray I independently visualized and interpreted imaging. I agree with the radiologist interpretation   Medicines ordered and prescription drug management: Meds ordered this encounter  Medications   HYDROmorphone  (DILAUDID ) injection 1 mg   LORazepam  (ATIVAN ) tablet 1 mg   sodium chloride  0.9 % bolus 1,000 mL   oxyCODONE  (Oxy IR/ROXICODONE ) immediate release tablet 5 mg    Refill:  0    -I have reviewed the patients home medicines and have made adjustments as needed  Critical interventions   Cardiac  Monitoring: The patient was maintained on a cardiac monitor.  I personally viewed and interpreted the cardiac monitored which showed an underlying rhythm of: Normal sinus rhythm  Social Determinants of Health:  Factors impacting patients care include: Lack of access to primary care   Reevaluation: After the interventions noted above, I reevaluated the patient and found that they have :improved  Co morbidities that complicate the patient evaluation  Past Medical History:  Diagnosis Date   Alcoholism /alcohol  abuse    Anemia    Anxiety    Arthritis    knees; arms; elbows (03/26/2015)   Asthma    Bipolar disorder (HCC)    Chronic bronchitis (HCC)    Chronic lower back pain    Chronic pancreatitis (HCC)    Cocaine abuse (HCC)    Depression    Family history of adverse reaction to anesthesia    Femoral condyle fracture (HCC) 03/08/2014   left medial/notes 03/09/2014   GERD (gastroesophageal reflux disease)    H/O hiatal hernia    H/O suicide attempt 10/2012   High cholesterol    History of blood transfusion 10/2012   when I tried to commit suicide   History of stomach ulcers    Hypertension    Marijuana abuse, continuous    Migraine    a few times/year (03/26/2015)   Pancreatitis    Pneumonia 1990's X 3   PTSD (post-traumatic stress disorder)    Seizures (HCC)    Sickle cell trait (HCC)    WPW (Wolff-Parkinson-White syndrome)    thelbert 03/06/2013         Final Clinical Impression(s) / ED Diagnoses Final diagnoses:  Syncope, unspecified syncope type  Epigastric pain     @PCDICTATION @    Mannie Pac T, DO 11/10/23 1346

## 2023-11-10 NOTE — Consult Note (Addendum)
 CARDIOLOGY CONSULT NOTE    Patient ID: [name]; [id number]; 1970/08/01   Admit date: 11/10/2023 Date of Consult: 11/10/2023  Primary Care Provider: Celestia Rosaline SQUIBB, NP Primary Cardiologist:  Primary Electrophysiologist:     History of Present Illness:   Mr. [name] is a 53 year old M known to have WPW s/p ablation in 2014/2015, polysubstance abuse (cocaine, alcohol ), chronic pancreatitis presented to ER with syncope.  Patient was walking in his house when he suddenly collapsed and passed out.  Did not have any warning signs of dizziness or palpitations.  Unknown duration of syncope.  After he woke up, he called 911 and was told to call them again if he had any issues.  He started having chest pain, stomach pain and dizziness which was when he called 911 again.  He was brought to the ER.  Per chart review, he has a history of A-fib with RVR in the past and decision was made not to anticoagulate due to low CHA2DS2-VASc 2 score.  He continues to drink alcohol , last drink was 2 days ago.  He continues to sniff cocaine, last testing was 9 days ago.  He reported that, to his knowledge, he took cocaine 9 days ago but he is unsure if anyone drugged him recently.  He smokes weed.  EKG showed short PR interval (old), sinus tachycardia, HR 100 bpm, no ischemia. Hs troponins normal, 8>>6>>12. BP upon arrival was around 200 mmHg SBP but at home it stays between 120 and 130 mmHg SBP according to the patient.  He had recurrent hospitalizations for chest pain and abdominal pain.  Past Medical History:  Diagnosis Date   Alcoholism /alcohol  abuse    Anemia    Anxiety    Arthritis    knees; arms; elbows (03/26/2015)   Asthma    Bipolar disorder (HCC)    Chronic bronchitis (HCC)    Chronic lower back pain    Chronic pancreatitis (HCC)    Cocaine abuse (HCC)    Depression    Family history of adverse reaction to anesthesia    Femoral condyle fracture (HCC) 03/08/2014   left  medial/notes 03/09/2014   GERD (gastroesophageal reflux disease)    H/O hiatal hernia    H/O suicide attempt 10/2012   High cholesterol    History of blood transfusion 10/2012   when I tried to commit suicide   History of stomach ulcers    Hypertension    Marijuana abuse, continuous    Migraine    a few times/year (03/26/2015)   Pancreatitis    Pneumonia 1990's X 3   PTSD (post-traumatic stress disorder)    Seizures (HCC)    Sickle cell trait (HCC)    WPW (Wolff-Parkinson-White syndrome)    thelbert 03/06/2013    Past Surgical History:  Procedure Laterality Date   BIOPSY  11/25/2017   Procedure: BIOPSY;  Surgeon: Jason Fallow, MD;  Location: [facility] ENDOSCOPY;  Service: Endoscopy;;   BIOPSY  10/14/2018   Procedure: BIOPSY;  Surgeon: Jason Fallow, MD;  Location: [facility] ENDOSCOPY;  Service: Endoscopy;;   BIOPSY  03/06/2023   Procedure: BIOPSY;  Surgeon: [name]., MD;  Location: [facility];  Service: Gastroenterology;;   CARDIAC CATHETERIZATION     CYST ENTEROSTOMY  01/02/2020   Procedure: CYST ASPIRATION;  Surgeon: Jason Toribio SQUIBB, MD;  Location: [facility] ENDOSCOPY;  Service: Endoscopy;;   ESOPHAGOGASTRODUODENOSCOPY N/A 03/06/2023   Procedure: ESOPHAGOGASTRODUODENOSCOPY (EGD);  Surgeon: [name]., MD;  Location: [facility];  Service:  Gastroenterology;  Laterality: N/A;   ESOPHAGOGASTRODUODENOSCOPY (EGD) WITH PROPOFOL  N/A 11/25/2017   Procedure: ESOPHAGOGASTRODUODENOSCOPY (EGD) WITH PROPOFOL ;  Surgeon: Jason Fallow, MD;  Location: [facility] ENDOSCOPY;  Service: Endoscopy;  Laterality: N/A;   ESOPHAGOGASTRODUODENOSCOPY (EGD) WITH PROPOFOL  Left 10/14/2018   Procedure: ESOPHAGOGASTRODUODENOSCOPY (EGD) WITH PROPOFOL ;  Surgeon: Jason Fallow, MD;  Location: [facility] ENDOSCOPY;  Service: Endoscopy;  Laterality: Left;   ESOPHAGOGASTRODUODENOSCOPY (EGD) WITH PROPOFOL  N/A 11/14/2018   Procedure: ESOPHAGOGASTRODUODENOSCOPY (EGD) WITH PROPOFOL ;  Surgeon: Jason Agent, MD;  Location:  [facility] ENDOSCOPY;  Service: Gastroenterology;  Laterality: N/A;   ESOPHAGOGASTRODUODENOSCOPY (EGD) WITH PROPOFOL  N/A 01/02/2020   Procedure: ESOPHAGOGASTRODUODENOSCOPY (EGD) WITH PROPOFOL ;  Surgeon: Jason Toribio SQUIBB, MD;  Location: [facility] ENDOSCOPY;  Service: Endoscopy;  Laterality: N/A;   ESOPHAGOGASTRODUODENOSCOPY (EGD) WITH PROPOFOL  N/A 10/25/2020   Procedure: ESOPHAGOGASTRODUODENOSCOPY (EGD) WITH PROPOFOL ;  Surgeon: [name]., MD;  Location: [facility] ENDOSCOPY;  Service: Gastroenterology;  Laterality: N/A;   EUS N/A 01/02/2020   Procedure: UPPER ENDOSCOPIC ULTRASOUND (EUS) RADIAL;  Surgeon: Jason Toribio SQUIBB, MD;  Location: [facility] ENDOSCOPY;  Service: Endoscopy;  Laterality: N/A;   EUS N/A 03/06/2023   Procedure: UPPER ENDOSCOPIC ULTRASOUND (EUS) RADIAL;  Surgeon: [name]., MD;  Location: [facility] ENDOSCOPY;  Service: Gastroenterology;  Laterality: N/A;   EYE SURGERY Left 1990's   result of trauma    FACIAL FRACTURE SURGERY Left 1990's   result of trauma    FINE NEEDLE ASPIRATION N/A 03/06/2023   Procedure: FINE NEEDLE ASPIRATION (FNA) LINEAR;  Surgeon: [name]., MD;  Location: [facility] ENDOSCOPY;  Service: Gastroenterology;  Laterality: N/A;   FLEXIBLE SIGMOIDOSCOPY N/A 10/25/2020   Procedure: FLEXIBLE SIGMOIDOSCOPY;  Surgeon: [name]., MD;  Location: [name] ENDOSCOPY;  Service: Gastroenterology;  Laterality: N/A;   FRACTURE SURGERY     HEMOSTASIS CLIP PLACEMENT  10/25/2020   Procedure: HEMOSTASIS CLIP PLACEMENT;  Surgeon: [name]., MD;  Location: [facility] ENDOSCOPY;  Service: Gastroenterology;;   HERNIA REPAIR     HOT HEMOSTASIS N/A 03/06/2023   Procedure: HOT HEMOSTASIS (ARGON PLASMA COAGULATION/BICAP);  Surgeon: [name]., MD;  Location: [facility];  Service: Gastroenterology;  Laterality: N/A;   LEFT HEART CATHETERIZATION WITH CORONARY ANGIOGRAM Right 03/07/2013   Procedure: LEFT HEART CATHETERIZATION WITH CORONARY ANGIOGRAM;  Surgeon:  Jason GORMAN Negri, MD;  Location: [facility] CATH LAB;  Service: Cardiovascular;  Laterality: Right;   UMBILICAL HERNIA REPAIR     UPPER GASTROINTESTINAL ENDOSCOPY         Inpatient Medications: Scheduled Meds:  [START ON 11/11/2023] famotidine   40 mg Oral Daily   heparin   5,000 Units Subcutaneous Q8H   lipase/protease/amylase  72,000 Units Oral TID WC   nicotine   21 mg Transdermal Daily   [START ON 11/11/2023] pantoprazole   20 mg Oral Daily   sodium chloride  flush  3 mL Intravenous Q12H   sucralfate   1 g Oral TID WC & HS   Continuous Infusions:  PRN Meds: acetaminophen  **OR** acetaminophen , HYDROmorphone  (DILAUDID ) injection, lidocaine , lipase/protease/amylase **AND** lipase/protease/amylase, oxyCODONE , polyethylene glycol  Allergies:    Allergies  Allergen Reactions   Robaxin  [Methocarbamol ] Other (See Comments)    jumpy limbs   Aspirin  Other (See Comments)    Unknown reaction   Shellfish-Derived Products Nausea And Vomiting and Rash   Trazodone  And Nefazodone Other (See Comments)    Muscle spasms   Adhesive [Tape] Itching   Fish-Derived Products    Latex Itching   Toradol  [Ketorolac  Tromethamine ] Other (See Comments)    Has ulcers; cannot have this  Contrast Media [Iodinated Contrast Media] Hives   Reglan  [Metoclopramide ] Other (See Comments)    Muscle spasms   Salmon [Fish Oil] Nausea And Vomiting and Rash    Social History:   Social History   Socioeconomic History   Marital status: Single    Spouse name: Not on file   Number of children: Not on file   Years of education: Not on file   Highest education level: Not on file  Occupational History   Occupation: disabled  Tobacco Use   Smoking status: Every Day    Current packs/day: 1.00    Average packs/day: 1 pack/day for 36.0 years (36.0 ttl pk-yrs)    Types: Cigarettes, E-cigarettes   Smokeless tobacco: Never  Vaping Use   Vaping status: Former  Substance and Sexual Activity   Alcohol  use: Not Currently     Alcohol /week: 4.0 standard drinks of alcohol     Types: 4 Cans of beer per week   Drug use: Yes    Types: Marijuana, Cocaine    Comment: Last used cocaine a week ago   Sexual activity: Yes    Birth control/protection: None  Other Topics Concern   Not on file  Social History Narrative   ** Merged History Encounter **       Social Drivers of Corporate investment banker Strain: Not on file  Food Insecurity: No Food Insecurity (08/10/2023)   Hunger Vital Sign    Worried About Running Out of Food in the Last Year: Never true    Ran Out of Food in the Last Year: Never true  Transportation Needs: No Transportation Needs (08/10/2023)   PRAPARE - Administrator, Civil Service (Medical): No    Lack of Transportation (Non-Medical): No  Physical Activity: Not on file  Stress: Not on file  Social Connections: Not on file  Intimate Partner Violence: Not At Risk (08/10/2023)   Humiliation, Afraid, Rape, and Kick questionnaire    Fear of Current or Ex-Partner: No    Emotionally Abused: No    Physically Abused: No    Sexually Abused: No    Family History:    Family History  Problem Relation Age of Onset   Hypertension Mother    Cirrhosis Father    Alcoholism Father    Hypertension Father    Melanoma Father    Hypertension Other    Coronary artery disease Other      ROS:  Please see the history of present illness.  ROS  All other ROS reviewed and negative.     Physical Exam/Data:   Vitals:   11/10/23 1529 11/10/23 1532 11/10/23 1533 11/10/23 1744  BP: (!) 203/112   (!) 198/119  Pulse: 95  95 69  Resp: 20   20  Temp: 97.9 F (36.6 C)   (!) 97.5 F (36.4 C)  TempSrc: Oral   Axillary  SpO2: 100% 100% 100%   Weight:  55.7 kg    Height:  5' 8 (1.727 m)     No intake or output data in the 24 hours ending 11/10/23 1800 Filed Weights   11/10/23 1109 11/10/23 1532  Weight: 56.2 kg 55.7 kg   Body mass index is 18.66 kg/m.  General:  Well nourished, well developed,  in no acute distress HEENT: normal Lymph: no adenopathy Neck: no JVD Endocrine:  No thryomegaly Vascular: No carotid bruits; FA pulses 2+ bilaterally without bruits  Cardiac:  normal S1, S2; RRR; no murmur  Lungs:  clear to  auscultation bilaterally, no wheezing, rhonchi or rales  Abd: soft, nontender, no hepatomegaly  Ext: no edema Musculoskeletal:  No deformities, BUE and BLE strength normal and equal Skin: warm and dry  Neuro:  CNs 2-12 intact, no focal abnormalities noted Psych:  Normal affect   EKG:  The EKG was personally reviewed and demonstrates:   Telemetry:  Telemetry was personally reviewed and demonstrates:    Relevant CV Studies:   Laboratory Data:  Chemistry Recent Labs  Lab 11/06/23 2341 11/10/23 1115  NA 136 137  K 3.6 4.4  CL 107 107  CO2 18* 18*  GLUCOSE 96 83  BUN 7 12  CREATININE 0.73 0.96  CALCIUM  8.1* 8.6*  GFRNONAA >60 >60  ANIONGAP 11 12    Recent Labs  Lab 11/06/23 2341 11/10/23 1115  PROT 6.6 6.9  ALBUMIN  3.7 3.5  AST 58* 54*  ALT 36 25  ALKPHOS 66 68  BILITOT 0.7 0.9   Hematology Recent Labs  Lab 11/06/23 2341 11/10/23 1115  WBC 6.5 9.2  RBC 3.35* 3.45*  HGB 9.9* 10.7*  HCT 30.4* 32.4*  MCV 90.7 93.9  MCH 29.6 31.0  MCHC 32.6 33.0  RDW 17.6* 16.9*  PLT 203 172   Cardiac EnzymesNo results for input(s): TROPONINI in the last 168 hours. No results for input(s): TROPIPOC in the last 168 hours.  BNPNo results for input(s): BNP, PROBNP in the last 168 hours.  DDimer  Recent Labs  Lab 11/07/23 0113  DDIMER 0.29    Radiology/Studies:    Assessment and Plan:   Syncope: Rule out cardiac etiology.  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  Echocardiogram today showed normal LVEF and no valvular heart disease.,  Unremarkable.  Obtain 2-week event monitor, live upon discharge.  Obtain rapid urine drug screen and serum alcohol  level.  Hypertensive urgency: BP on arrival  is around 200 mmHg SBP, repeat BP during my interview is around 194 mmHg SBP.  Home BPs range around 120 to 130 mmHg SBP.  Acute elevation in BP likely secondary to alcohol  withdrawal versus/and acute cocaine use. Obtain rapid urine drug screen and serum alcohol  level.  Recommend to start benzodiazepines, switch metoprolol  to carvedilol  3.125 mg twice daily (due to active cocaine use) and uptitrate to keep BP under control.  Polysubstance abuse:  He continues to drink alcohol , last drink was 2 days ago.  He continues to sniff cocaine, last testing was 9 days ago.  He reported that, to his knowledge, he took cocaine 9 days ago but he is unsure if anyone drugged him recently.  He smokes weed. Obtain rapid urine drug screen and serum alcohol  level.   Noncardiac chest pain: Chest pain likely secondary to hypertensive urgency versus/and polysubstance abuse.  Troponins normal.  WPW s/p ablation in 2014/2015: Patient requesting for WPW ablation this admission.  Will obtain event monitor first.  EKG shows short PR interval but no evidence of preexcitation.  Nonspecific short PR interval.   For questions or updates, please contact [facility] Please consult [email or web address] for contact info under Cardiology/STEMI.   Signed, Jeremaih Klima Priya Jordyn Hofacker, MD 11/10/2023 6:00 PM

## 2023-11-10 NOTE — Progress Notes (Signed)
 MEWS Progress Note  Patient Details Name: Martavis Gurney MRN: [id number] DOB: 04-Sep-1970 Today's Date: 11/10/2023   MEWS Flowsheet Documentation:  Assess: MEWS Score Temp: 97.9 F (36.6 C) BP: (!) 203/112 MAP (mmHg): 134 Pulse Rate: 95 ECG Heart Rate: 97 Resp: 20 Level of Consciousness: Alert SpO2: 100 % O2 Device: Room Air Assess: MEWS Score MEWS Temp: 0 MEWS Systolic: 2 MEWS Pulse: 0 MEWS RR: 0 MEWS LOC: 0 MEWS Score: 2 MEWS Score Color: Yellow Assess: SIRS CRITERIA SIRS Temperature : 0 SIRS Respirations : 0 SIRS Pulse: 1 SIRS WBC: 0 SIRS Score Sum : 1 SIRS Temperature : 0 SIRS Pulse: 1 SIRS Respirations : 0 SIRS WBC: 0 SIRS Score Sum : 1 Assess: if the MEWS score is Yellow or Red Were vital signs accurate and taken at a resting state?: Yes Does the patient meet 2 or more of the SIRS criteria?: No MEWS guidelines implemented : Yes, yellow Treat MEWS Interventions: Considered administering scheduled or prn medications/treatments as ordered Take Vital Signs Increase Vital Sign Frequency : Yellow: Q2hr x1, continue Q4hrs until patient remains green for 12hrs Escalate MEWS: Escalate: Yellow: Discuss with charge nurse and consider notifying provider and/or RRT        Loyce Blazing 11/10/2023, 4:56 PM

## 2023-11-10 NOTE — ED Triage Notes (Signed)
 Pt had an unwitnessed episode and woke up on floor. LOC. C/O left wrist pain/deformity. CP started this morning post syncope episode, non radiating. Sinus tach with EMS. No aspirin  given due to stomach ulcer. EMS gave 100 mcgs of fentanyl . BP 190 systolic. Axox4. RA

## 2023-11-10 NOTE — Progress Notes (Signed)
  Echocardiogram 2D Echocardiogram has been performed.  Kimesha Claxton 11/10/2023, 4:46 PM

## 2023-11-10 NOTE — Progress Notes (Signed)
 Orthopedic Tech Progress Note Patient Details:  [name] 1971/04/30 [id number]  Ortho Devices Type of Ortho Device: Sugartong splint Ortho Device/Splint Location: LUE Ortho Device/Splint Interventions: Application, Ordered, Adjustment   Post Interventions Patient Tolerated: Well Instructions Provided: Care of device  [name] 11/10/2023, 3:30 PM

## 2023-11-11 DIAGNOSIS — I472 Ventricular tachycardia, unspecified: Secondary | ICD-10-CM | POA: Diagnosis present

## 2023-11-11 DIAGNOSIS — D649 Anemia, unspecified: Secondary | ICD-10-CM | POA: Diagnosis present

## 2023-11-11 DIAGNOSIS — D573 Sickle-cell trait: Secondary | ICD-10-CM | POA: Diagnosis present

## 2023-11-11 DIAGNOSIS — F1010 Alcohol abuse, uncomplicated: Secondary | ICD-10-CM | POA: Diagnosis present

## 2023-11-11 DIAGNOSIS — K860 Alcohol-induced chronic pancreatitis: Secondary | ICD-10-CM | POA: Diagnosis present

## 2023-11-11 DIAGNOSIS — M170 Bilateral primary osteoarthritis of knee: Secondary | ICD-10-CM | POA: Diagnosis present

## 2023-11-11 DIAGNOSIS — R55 Syncope and collapse: Secondary | ICD-10-CM | POA: Diagnosis present

## 2023-11-11 DIAGNOSIS — G40909 Epilepsy, unspecified, not intractable, without status epilepticus: Secondary | ICD-10-CM | POA: Diagnosis present

## 2023-11-11 DIAGNOSIS — E7800 Pure hypercholesterolemia, unspecified: Secondary | ICD-10-CM | POA: Diagnosis present

## 2023-11-11 DIAGNOSIS — I456 Pre-excitation syndrome: Secondary | ICD-10-CM | POA: Diagnosis not present

## 2023-11-11 DIAGNOSIS — I480 Paroxysmal atrial fibrillation: Secondary | ICD-10-CM | POA: Diagnosis present

## 2023-11-11 DIAGNOSIS — I160 Hypertensive urgency: Secondary | ICD-10-CM | POA: Diagnosis present

## 2023-11-11 DIAGNOSIS — I10 Essential (primary) hypertension: Secondary | ICD-10-CM | POA: Diagnosis present

## 2023-11-11 DIAGNOSIS — S52615A Nondisplaced fracture of left ulna styloid process, initial encounter for closed fracture: Secondary | ICD-10-CM | POA: Diagnosis present

## 2023-11-11 DIAGNOSIS — J4489 Other specified chronic obstructive pulmonary disease: Secondary | ICD-10-CM | POA: Diagnosis present

## 2023-11-11 DIAGNOSIS — F17210 Nicotine dependence, cigarettes, uncomplicated: Secondary | ICD-10-CM | POA: Diagnosis present

## 2023-11-11 DIAGNOSIS — K219 Gastro-esophageal reflux disease without esophagitis: Secondary | ICD-10-CM | POA: Diagnosis present

## 2023-11-11 DIAGNOSIS — M545 Low back pain, unspecified: Secondary | ICD-10-CM | POA: Diagnosis present

## 2023-11-11 DIAGNOSIS — M19022 Primary osteoarthritis, left elbow: Secondary | ICD-10-CM | POA: Diagnosis present

## 2023-11-11 DIAGNOSIS — S52502A Unspecified fracture of the lower end of left radius, initial encounter for closed fracture: Secondary | ICD-10-CM | POA: Diagnosis present

## 2023-11-11 DIAGNOSIS — G8929 Other chronic pain: Secondary | ICD-10-CM | POA: Diagnosis present

## 2023-11-11 DIAGNOSIS — F1210 Cannabis abuse, uncomplicated: Secondary | ICD-10-CM | POA: Diagnosis present

## 2023-11-11 DIAGNOSIS — F1410 Cocaine abuse, uncomplicated: Secondary | ICD-10-CM | POA: Diagnosis present

## 2023-11-11 DIAGNOSIS — W19XXXA Unspecified fall, initial encounter: Secondary | ICD-10-CM | POA: Diagnosis present

## 2023-11-11 DIAGNOSIS — M19021 Primary osteoarthritis, right elbow: Secondary | ICD-10-CM | POA: Diagnosis present

## 2023-11-11 LAB — MAGNESIUM: Magnesium: 1.1 mg/dL — ABNORMAL LOW (ref 1.7–2.4)

## 2023-11-11 LAB — COMPREHENSIVE METABOLIC PANEL WITH GFR
ALT: 27 U/L (ref 0–44)
AST: 35 U/L (ref 15–41)
Albumin: 3.9 g/dL (ref 3.5–5.0)
Alkaline Phosphatase: 71 U/L (ref 38–126)
Anion gap: 13 (ref 5–15)
BUN: 10 mg/dL (ref 6–20)
CO2: 20 mmol/L — ABNORMAL LOW (ref 22–32)
Calcium: 9 mg/dL (ref 8.9–10.3)
Chloride: 103 mmol/L (ref 98–111)
Creatinine, Ser: 0.9 mg/dL (ref 0.61–1.24)
GFR, Estimated: >60 mL/min (ref >60)
Glucose, Bld: 74 mg/dL (ref 70–99)
Potassium: 4.2 mmol/L (ref 3.5–5.1)
Sodium: 136 mmol/L (ref 135–145)
Total Bilirubin: 1.4 mg/dL — ABNORMAL HIGH (ref 0.0–1.2)
Total Protein: 7.5 g/dL (ref 6.5–8.1)

## 2023-11-11 LAB — CBC
HCT: 35.6 % — ABNORMAL LOW (ref 39.0–52.0)
Hemoglobin: 11.7 g/dL — ABNORMAL LOW (ref 13.0–17.0)
MCH: 31.4 pg (ref 26.0–34.0)
MCHC: 32.9 g/dL (ref 30.0–36.0)
MCV: 95.4 fL (ref 80.0–100.0)
Platelets: 155 K/uL (ref 150–400)
RBC: 3.73 MIL/uL — ABNORMAL LOW (ref 4.22–5.81)
RDW: 16.5 % — ABNORMAL HIGH (ref 11.5–15.5)
WBC: 8.9 K/uL (ref 4.0–10.5)
nRBC: 0 % (ref 0.0–0.2)

## 2023-11-11 LAB — GLUCOSE, CAPILLARY
Glucose-Capillary: 61 mg/dL — ABNORMAL LOW (ref 70–99)
Glucose-Capillary: 129 mg/dL — ABNORMAL HIGH (ref 70–99)

## 2023-11-11 MED ORDER — HYDROMORPHONE HCL 1 MG/ML IJ SOLN
0.5000 mg | INTRAMUSCULAR | Status: DC | PRN
  Administered 2023-11-11 – 2023-11-13 (×8): 0.5 mg via INTRAVENOUS
  Filled 2023-11-11 (×8): qty 1

## 2023-11-11 MED ORDER — LORAZEPAM 1 MG PO TABS
1.0000 mg | ORAL_TABLET | ORAL | Status: DC | PRN
  Administered 2023-11-11 – 2023-11-12 (×7): 2 mg via ORAL
  Filled 2023-11-11 (×7): qty 2

## 2023-11-11 MED ORDER — CARVEDILOL 12.5 MG PO TABS
12.5000 mg | ORAL_TABLET | Freq: Two times a day (BID) | ORAL | Status: DC
  Administered 2023-11-11: 12.5 mg via ORAL
  Filled 2023-11-11: qty 1

## 2023-11-11 MED ORDER — LORAZEPAM 2 MG/ML IJ SOLN: 1.0000 mg | INTRAMUSCULAR | Status: DC | PRN

## 2023-11-11 MED ORDER — ONDANSETRON HCL 4 MG/2ML IJ SOLN
4.0000 mg | Freq: Four times a day (QID) | INTRAMUSCULAR | Status: DC | PRN
  Administered 2023-11-11 – 2023-11-13 (×6): 4 mg via INTRAVENOUS
  Filled 2023-11-11 (×6): qty 2

## 2023-11-11 MED ORDER — OXYCODONE HCL 5 MG PO TABS
10.0000 mg | ORAL_TABLET | ORAL | Status: DC | PRN
  Administered 2023-11-11 – 2023-11-13 (×7): 10 mg via ORAL
  Filled 2023-11-11 (×7): qty 2

## 2023-11-11 MED ORDER — MAGNESIUM SULFATE 4 GM/100ML IV SOLN
4.0000 g | Freq: Once | INTRAVENOUS | Status: AC
  Administered 2023-11-11: 4 g via INTRAVENOUS
  Filled 2023-11-11: qty 100

## 2023-11-11 MED ORDER — HYDRALAZINE HCL 20 MG/ML IJ SOLN: 5.0000 mg | Freq: Four times a day (QID) | INTRAMUSCULAR | Status: DC | PRN

## 2023-11-11 MED ORDER — THIAMINE MONONITRATE 100 MG PO TABS
100.0000 mg | ORAL_TABLET | Freq: Every day | ORAL | Status: DC
  Administered 2023-11-11 – 2023-11-13 (×3): 100 mg via ORAL
  Filled 2023-11-11 (×3): qty 1

## 2023-11-11 MED ORDER — FOLIC ACID 1 MG PO TABS
1.0000 mg | ORAL_TABLET | Freq: Every day | ORAL | Status: DC
  Administered 2023-11-11 – 2023-11-13 (×3): 1 mg via ORAL
  Filled 2023-11-11 (×3): qty 1

## 2023-11-11 MED ORDER — THIAMINE HCL 100 MG/ML IJ SOLN: 100.0000 mg | Freq: Every day | INTRAMUSCULAR | Status: DC

## 2023-11-11 MED ORDER — ADULT MULTIVITAMIN W/MINERALS CH
1.0000 | ORAL_TABLET | Freq: Every day | ORAL | Status: DC
  Administered 2023-11-11 – 2023-11-13 (×3): 1 via ORAL
  Filled 2023-11-11 (×3): qty 1

## 2023-11-11 MED ORDER — HYDRALAZINE HCL 20 MG/ML IJ SOLN
5.0000 mg | Freq: Four times a day (QID) | INTRAMUSCULAR | Status: DC | PRN
  Filled 2023-11-11: qty 1

## 2023-11-11 NOTE — Plan of Care (Signed)
   Problem: Education: Goal: Knowledge of condition and prescribed therapy will improve Outcome: Progressing   Problem: Cardiac: Goal: Will achieve and/or maintain adequate cardiac output Outcome: Progressing   Problem: Physical Regulation: Goal: Complications related to the disease process, condition or treatment will be avoided or minimized Outcome: Progressing   Problem: Education: Goal: Knowledge of General Education information will improve Description: Including pain rating scale, medication(s)/side effects and non-pharmacologic comfort measures Outcome: Progressing   Problem: Health Behavior/Discharge Planning: Goal: Ability to manage health-related needs will improve Outcome: Progressing   Problem: Clinical Measurements: Goal: Ability to maintain clinical measurements within normal limits will improve Outcome: Progressing Goal: Will remain free from infection Outcome: Progressing Goal: Diagnostic test results will improve Outcome: Progressing Goal: Respiratory complications will improve Outcome: Progressing Goal: Cardiovascular complication will be avoided Outcome: Progressing   Problem: Activity: Goal: Risk for activity intolerance will decrease Outcome: Progressing   Problem: Nutrition: Goal: Adequate nutrition will be maintained Outcome: Progressing   Problem: Coping: Goal: Level of anxiety will decrease Outcome: Progressing   Problem: Elimination: Goal: Will not experience complications related to bowel motility Outcome: Progressing Goal: Will not experience complications related to urinary retention Outcome: Progressing   Problem: Pain Managment: Goal: General experience of comfort will improve and/or be controlled Outcome: Progressing   Problem: Safety: Goal: Ability to remain free from injury will improve Outcome: Progressing   Problem: Skin Integrity: Goal: Risk for impaired skin integrity will decrease Outcome: Progressing

## 2023-11-11 NOTE — Progress Notes (Signed)
 PROGRESS NOTE    [name]  FMW:[id number] DOB: 1970-06-04 DOA: 11/10/2023 PCP: Celestia Rosaline SQUIBB, NP    Brief Narrative:  53 year old with history of WPW status post ablation in 2015, polysubstance abuse, chronic pancreatitis comes to the ER with complaints of syncope.  In the ER noted to have tachycardia, relatively flat troponin.  EKG showed normal sinus rhythm, echocardiogram showed normal preserved EF.   Assessment & Plan:  Principal Problem:   Syncope and collapse Active Problems:   Paroxysmal atrial fibrillation (HCC)   Alcohol  abuse   Essential hypertension   Polysubstance abuse (tobacco, cocaine, THC, and ETOH)   Chronic pancreatitis (HCC)   Major depressive disorder, recurrent severe without psychotic features (HCC)   Normocytic anemia   GERD (gastroesophageal reflux disease)   Seizure (HCC)   WPW (Wolff-Parkinson-White syndrome)   NSVT (nonsustained ventricular tachycardia) (HCC)   Non-cardiac chest pain    Syncope and collapse History of WPW No clear etiology at this time.  EKG is relatively unremarkable with short PR.  Echocardiogram shows preserved EF.  Seen by cardiology.  Would benefit from ambulatory cardiac monitor   Hypertension Resume home regimen IV as needed   GERD - Continue home PPI, Pepcid    Chronic pancreatitis - Continue home Creon    Wrist fracture Left-sided wrist fracture likely from fall.  Splint placed in the ER.  Will need outpatient follow-up with orthopedic   Chronic pain Pain control with bowel regimen   Paroxysmal A-fib Now on Coreg  per cardiology.  Currently normal sinus rhythm   Anemia Hemoglobin stable around 11.0   Depression - Not currently on medication for this   Substance use Positive for benzodiazepine, opioids and cocaine.  Counseled to quit using this   History of seizure - Not currently on any antiepileptics     DVT prophylaxis: heparin  injection 5,000 Units Start: 11/10/23 2200    Code  Status: Full Code Family Communication:   Discharge once cleared by cardiology    Subjective: Seen at bedside, reporting of chest discomfort and asking for IV Dilaudid    Examination:  General exam: Appears calm and comfortable  Respiratory system: Clear to auscultation. Respiratory effort normal. Cardiovascular system: S1 & S2 heard, RRR. No JVD, murmurs, rubs, gallops or clicks. No pedal edema. Gastrointestinal system: Abdomen is nondistended, soft and nontender. No organomegaly or masses felt. Normal bowel sounds heard. Central nervous system: Alert and oriented. No focal neurological deficits. Extremities: Symmetric 5 x 5 power. Skin: No rashes, lesions or ulcers Psychiatry: Judgement and insight appear normal. Mood & affect appropriate.                Diet Orders (From admission, onward)     Start     Ordered   11/10/23 1900  Diet regular Fluid consistency: Thin  Diet effective now       Question:  Fluid consistency:  Answer:  Thin   11/10/23 1859            Objective: Vitals:   11/11/23 0030 11/11/23 0438 11/11/23 0533 11/11/23 0812  BP: (!) 172/119 122/72  (!) 157/97  Pulse: 83 88  73  Resp: 16 18  16   Temp: 98.3 F (36.8 C) 98.3 F (36.8 C)  97.8 F (36.6 C)  TempSrc: Oral Oral  Oral  SpO2: 98% 97%  98%  Weight:   54.7 kg   Height:        Intake/Output Summary (Last 24 hours) at 11/11/2023 1039 Last data filed at 11/11/2023  9185 Gross per 24 hour  Intake 1080 ml  Output 1350 ml  Net -270 ml   Filed Weights   11/10/23 1109 11/10/23 1532 11/11/23 0533  Weight: 56.2 kg 55.7 kg 54.7 kg    Scheduled Meds:  carvedilol   12.5 mg Oral BID WC   famotidine   40 mg Oral Daily   folic acid   1 mg Oral Daily   heparin   5,000 Units Subcutaneous Q8H   lipase/protease/amylase  72,000 Units Oral TID WC   multivitamin with minerals  1 tablet Oral Daily   nicotine   21 mg Transdermal Daily   pantoprazole   20 mg Oral Daily   sodium chloride  flush  3 mL  Intravenous Q12H   sucralfate   1 g Oral TID WC & HS   thiamine   100 mg Oral Daily   Or   thiamine   100 mg Intravenous Daily   Continuous Infusions:  magnesium  sulfate bolus IVPB      Nutritional status     Body mass index is 18.34 kg/m.  Data Reviewed:   CBC: Recent Labs  Lab 11/06/23 2341 11/10/23 1115 11/11/23 0236  WBC 6.5 9.2 8.9  NEUTROABS  --  6.1  --   HGB 9.9* 10.7* 11.7*  HCT 30.4* 32.4* 35.6*  MCV 90.7 93.9 95.4  PLT 203 172 155   Basic Metabolic Panel: Recent Labs  Lab 11/06/23 2341 11/10/23 1115 11/10/23 1319 11/11/23 0236  NA 136 137  --  136  K 3.6 4.4  --  4.2  CL 107 107  --  103  CO2 18* 18*  --  20*  GLUCOSE 96 83  --  74  BUN 7 12  --  10  CREATININE 0.73 0.96  --  0.90  CALCIUM  8.1* 8.6*  --  9.0  MG  --   --  1.2* 1.1*   GFR: Estimated Creatinine Clearance: 72.6 mL/min (by C-G formula based on SCr of 0.9 mg/dL). Liver Function Tests: Recent Labs  Lab 11/06/23 2341 11/10/23 1115 11/11/23 0236  AST 58* 54* 35  ALT 36 25 27  ALKPHOS 66 68 71  BILITOT 0.7 0.9 1.4*  PROT 6.6 6.9 7.5  ALBUMIN  3.7 3.5 3.9   Recent Labs  Lab 11/06/23 2341 11/10/23 1115  LIPASE 27 22   No results for input(s): AMMONIA in the last 168 hours. Coagulation Profile: No results for input(s): INR, PROTIME in the last 168 hours. Cardiac Enzymes: No results for input(s): CKTOTAL, CKMB, CKMBINDEX, TROPONINI in the last 168 hours. BNP (last 3 results) No results for input(s): PROBNP in the last 8760 hours. HbA1C: No results for input(s): HGBA1C in the last 72 hours. CBG: Recent Labs  Lab 11/11/23 0535 11/11/23 0702  GLUCAP 61* 129*   Lipid Profile: No results for input(s): CHOL, HDL, LDLCALC, TRIG, CHOLHDL, LDLDIRECT in the last 72 hours. Thyroid  Function Tests: No results for input(s): TSH, T4TOTAL, FREET4, T3FREE, THYROIDAB in the last 72 hours. Anemia Panel: No results for input(s): VITAMINB12,  FOLATE, FERRITIN, TIBC, IRON, RETICCTPCT in the last 72 hours. Sepsis Labs: No results for input(s): PROCALCITON, LATICACIDVEN in the last 168 hours.  No results found for this or any previous visit (from the past 240 hours).       Radiology Studies: ECHOCARDIOGRAM COMPLETE Result Date: 11/10/2023    ECHOCARDIOGRAM REPORT   Patient Name:   [facility] Date of Exam: 11/10/2023 Medical Rec #:  [id number]  Height:       68.0 in Accession #:    [id number]             Weight:       122.7 lb Date of Birth:  07-12-1970               BSA:          1.661 m Patient Age:    53 years               BP:           203/112 mmHg Patient Gender: M                      HR:           76 bpm. Exam Location:  Inpatient Procedure: 2D Echo (Both Spectral and Color Flow Doppler were utilized during            procedure). Indications:    syncope  History:        Patient has prior history of Echocardiogram examinations, most                 recent 02/16/2022. Arrythmias:WPW; Risk Factors:Hypertension,                 Current Smoker and polysubstance abuse.  Sonographer:    Tinnie Barefoot RDCS Referring Phys: [id number] MARSA NOVAK MELVIN IMPRESSIONS  1. Left ventricular ejection fraction, by estimation, is 70 to 75%. The left ventricle has hyperdynamic function. The left ventricle has no regional wall motion abnormalities. Left ventricular diastolic parameters are consistent with Grade I diastolic dysfunction (impaired relaxation).  2. Right ventricular systolic function is normal. The right ventricular size is normal. There is mildly elevated pulmonary artery systolic pressure. The estimated right ventricular systolic pressure is 44.0 mmHg.  3. The mitral valve is normal in structure. No evidence of mitral valve regurgitation. No evidence of mitral stenosis.  4. The aortic valve has an indeterminant number of cusps. Aortic valve regurgitation is not visualized. No aortic stenosis is present.  5.  The inferior vena cava is normal in size with <50% respiratory variability, suggesting right atrial pressure of 8 mmHg. Comparison(s): No significant change from prior study. FINDINGS  Left Ventricle: Left ventricular ejection fraction, by estimation, is 70 to 75%. The left ventricle has hyperdynamic function. The left ventricle has no regional wall motion abnormalities. Strain was performed and the global longitudinal strain is indeterminate. The left ventricular internal cavity size was normal in size. There is no left ventricular hypertrophy. Left ventricular diastolic parameters are consistent with Grade I diastolic dysfunction (impaired relaxation). Right Ventricle: The right ventricular size is normal. No increase in right ventricular wall thickness. Right ventricular systolic function is normal. There is mildly elevated pulmonary artery systolic pressure. The tricuspid regurgitant velocity is 3.00  m/s, and with an assumed right atrial pressure of 8 mmHg, the estimated right ventricular systolic pressure is 44.0 mmHg. Left Atrium: Left atrial size was normal in size. Right Atrium: Right atrial size was normal in size. Pericardium: There is no evidence of pericardial effusion. Mitral Valve: The mitral valve is normal in structure. No evidence of mitral valve regurgitation. No evidence of mitral valve stenosis. Tricuspid Valve: The tricuspid valve is normal in structure. Tricuspid valve regurgitation is trivial. No evidence of tricuspid stenosis. Aortic Valve: The aortic valve has an indeterminant number of cusps. Aortic valve regurgitation is not visualized. No aortic stenosis is present. Pulmonic  Valve: The pulmonic valve was not well visualized. Pulmonic valve regurgitation is not visualized. No evidence of pulmonic stenosis. Aorta: The aortic root and ascending aorta are structurally normal, with no evidence of dilitation. Venous: The inferior vena cava is normal in size with less than 50% respiratory  variability, suggesting right atrial pressure of 8 mmHg. IAS/Shunts: No atrial level shunt detected by color flow Doppler. Additional Comments: 3D was performed not requiring image post processing on an independent workstation and was indeterminate.  LEFT VENTRICLE PLAX 2D LVIDd:         4.40 cm   Diastology LVIDs:         2.60 cm   LV e' medial:    11.00 cm/s LV PW:         0.80 cm   LV E/e' medial:  11.1 LV IVS:        0.60 cm   LV e' lateral:   12.60 cm/s LVOT diam:     1.90 cm   LV E/e' lateral: 9.7 LV SV:         58 LV SV Index:   35 LVOT Area:     2.84 cm  RIGHT VENTRICLE             IVC RV Basal diam:  2.50 cm     IVC diam: 2.00 cm RV S prime:     14.80 cm/s TAPSE (M-mode): 1.8 cm LEFT ATRIUM             Index        RIGHT ATRIUM          Index LA diam:        3.00 cm 1.81 cm/m   RA Area:     9.63 cm LA Vol (A2C):   37.3 ml 22.46 ml/m  RA Volume:   19.90 ml 11.98 ml/m LA Vol (A4C):   31.1 ml 18.73 ml/m LA Biplane Vol: 34.4 ml 20.72 ml/m  AORTIC VALVE LVOT Vmax:   117.00 cm/s LVOT Vmean:  77.300 cm/s LVOT VTI:    0.206 m  AORTA Ao Root diam: 2.70 cm Ao Asc diam:  2.60 cm MITRAL VALVE                TRICUSPID VALVE MV Area (PHT): 5.02 cm     TR Peak grad:   36.0 mmHg MV Decel Time: 151 msec     TR Vmax:        300.00 cm/s MV E velocity: 122.00 cm/s MV A velocity: 96.30 cm/s   SHUNTS MV E/A ratio:  1.27         Systemic VTI:  0.21 m                             Systemic Diam: 1.90 cm Vishnu Priya Mallipeddi Electronically signed by Diannah Late Mallipeddi Signature Date/Time: 11/10/2023/4:55:23 PM    Final    CT Head Wo Contrast Result Date: 11/10/2023 CLINICAL DATA:  Ataxia.  Head trauma. EXAM: CT HEAD WITHOUT CONTRAST TECHNIQUE: Contiguous axial images were obtained from the base of the skull through the vertex without intravenous contrast. RADIATION DOSE REDUCTION: This exam was performed according to the departmental dose-optimization program which includes automated exposure control, adjustment of  the mA and/or kV according to patient size and/or use of iterative reconstruction technique. COMPARISON:  CT 10/19/2023 FINDINGS: Brain: No acute intracranial hemorrhage. No focal mass lesion. No CT evidence of acute infarction.  No midline shift or mass effect. No hydrocephalus. Basilar cisterns are patent. Vascular: No hyperdense vessel or unexpected calcification. Skull: Normal. Negative for fracture or focal lesion. Sinuses/Orbits: Paranasal sinuses and mastoid air cells are clear. Orbits are clear. Postsurgical hardware in the medial LEFT orbit. Other: None. IMPRESSION: 1. No acute intracranial findings. 2. Postsurgical changes in the LEFT orbit. Electronically Signed   By: Jackquline Boxer M.D.   On: 11/10/2023 11:57   DG Wrist Complete Left Result Date: 11/10/2023 CLINICAL DATA:  Unwitnessed fall.  Left wrist pain and deformity. EXAM: LEFT WRIST - COMPLETE 3+ VIEW COMPARISON:  None Available. FINDINGS: Three views are submitted. The mineralization is adequate. There is a nondisplaced ulnar styloid fracture. Probable nondisplaced fracture of the distal radius with mild dorsal cortical irregularity. No evidence of dislocation or carpal bone fracture. IV tubing in place. Moderate soft tissue swelling around the wrist without evidence of unexpected foreign body or soft tissue emphysema. IMPRESSION: Nondisplaced ulnar styloid fracture. Probable nondisplaced distal radius fracture. No dislocation. Electronically Signed   By: Elsie Perone M.D.   On: 11/10/2023 11:43           LOS: 0 days   Time spent= 35 mins    Burgess JAYSON Dare, MD [facility]  If 7PM-7AM, please contact night-coverage  11/11/2023, 10:39 AM

## 2023-11-11 NOTE — Hospital Course (Addendum)
 Brief Narrative:  53 year old with history of WPW status post ablation in 2015, polysubstance abuse, chronic pancreatitis comes to the ER with complaints of syncope.  In the ER noted to have tachycardia, relatively flat troponin.  EKG showed normal sinus rhythm, echocardiogram showed normal preserved EF.   Assessment & Plan:  Principal Problem:   Syncope and collapse Active Problems:   Paroxysmal atrial fibrillation (HCC)   Alcohol  abuse   Essential hypertension   Polysubstance abuse (tobacco, cocaine, THC, and ETOH)   Chronic pancreatitis (HCC)   Major depressive disorder, recurrent severe without psychotic features (HCC)   Normocytic anemia   GERD (gastroesophageal reflux disease)   Seizure (HCC)   WPW (Wolff-Parkinson-White syndrome)   NSVT (nonsustained ventricular tachycardia) (HCC)   Non-cardiac chest pain    Syncope and collapse History of WPW No clear etiology at this time.  EKG is relatively unremarkable with short PR.  Echocardiogram shows preserved EF.  Seen by cardiology.  Would benefit from ambulatory cardiac monitor  Chronic pancreatitis with persistent nausea vomiting - Continue home Creon  -Place him on full liquid diet  Paroxysmal A-fib Now on Coreg  per cardiology.  Currently normal sinus rhythm   Hypertension; uncontrolled labile.  Uptitrate Coreg  as needed.   HypoMg as needed   GERD - Continue home PPI, Pepcid       Wrist fracture Left-sided wrist fracture likely from fall.  Splint placed in the ER.  Will need outpatient follow-up with orthopedic   Chronic pain Pain control with bowel regimen    Anemia Hemoglobin stable around 11.0   Depression - Not currently on medication for this   Substance use Positive for benzodiazepine, opioids and cocaine.  Counseled to quit using this   History of seizure - Not currently on any antiepileptics     DVT prophylaxis: heparin  injection 5,000 Units Start: 11/10/23 2200    Code Status: Full  Code Family Communication:   Discharge once cleared by cardiology and is tolerating p.o.   Subjective: Nursing staff reports that patient continues to pace up and down the hallway asking for narcotic and Ativan .  He is also been ordering extra greasy food from the cafeteria. Reporting of nausea vomiting to me this morning along with abdominal pain.  He ordered 2 trays of food.  Examination:  General exam: Appears calm and comfortable  Respiratory system: Clear to auscultation. Respiratory effort normal. Cardiovascular system: S1 & S2 heard, RRR. No JVD, murmurs, rubs, gallops or clicks. No pedal edema. Gastrointestinal system: Abdomen is nondistended, soft and nontender. No organomegaly or masses felt. Normal bowel sounds heard. Central nervous system: Alert and oriented. No focal neurological deficits. Extremities: Symmetric 5 x 5 power. Skin: No rashes, lesions or ulcers Psychiatry: Judgement and insight appear normal. Mood & affect appropriate.

## 2023-11-11 NOTE — Progress Notes (Signed)
 Rounding Note   Patient Name: [name] Date of Encounter: 11/11/2023  [facility] Cardiologist: Lynwood Schilling, MD   Subjective BP 157/97.  Creatinine 0.9, magnesium  1.1.  Reports had some lightheadedness this morning  Scheduled Meds:  carvedilol   12.5 mg Oral BID WC   famotidine   40 mg Oral Daily   folic acid   1 mg Oral Daily   heparin   5,000 Units Subcutaneous Q8H   lipase/protease/amylase  72,000 Units Oral TID WC   multivitamin with minerals  1 tablet Oral Daily   nicotine   21 mg Transdermal Daily   pantoprazole   20 mg Oral Daily   sodium chloride  flush  3 mL Intravenous Q12H   sucralfate   1 g Oral TID WC & HS   thiamine   100 mg Oral Daily   Or   thiamine   100 mg Intravenous Daily   Continuous Infusions:  magnesium  sulfate bolus IVPB     PRN Meds: acetaminophen  **OR** acetaminophen , hydrALAZINE , lidocaine , lipase/protease/amylase **AND** lipase/protease/amylase, LORazepam  **OR** LORazepam , oxyCODONE , polyethylene glycol   Vital Signs  Vitals:   11/11/23 0030 11/11/23 0438 11/11/23 0533 11/11/23 0812  BP: (!) 172/119 122/72  (!) 157/97  Pulse: 83 88  73  Resp: 16 18  16   Temp: 98.3 F (36.8 C) 98.3 F (36.8 C)  97.8 F (36.6 C)  TempSrc: Oral Oral  Oral  SpO2: 98% 97%  98%  Weight:   54.7 kg   Height:        Intake/Output Summary (Last 24 hours) at 11/11/2023 1045 Last data filed at 11/11/2023 0814 Gross per 24 hour  Intake 1080 ml  Output 1350 ml  Net -270 ml      11/11/2023    5:33 AM 11/10/2023    3:32 PM 11/10/2023   11:09 AM  Last 3 Weights  Weight (lbs) 120 lb 9.5 oz 122 lb 11.2 oz 124 lb  Weight (kg) 54.7 kg 55.656 kg 56.246 kg      Telemetry Normal sinus rhythm- Personally Reviewed  ECG   ECG- Personally Reviewed  Physical Exam  GEN: No acute distress.   Neck: No JVD Cardiac: RRR, no murmurs, rubs, or gallops.  Respiratory: Clear to auscultation bilaterally. GI: Soft, nontender, non-distended  MS: No edema; left  arm in Ace wrap Neuro:  Nonfocal  Psych: Normal affect   Labs High Sensitivity Troponin:   Recent Labs  Lab 10/21/23 2231 11/06/23 2341 11/07/23 0113 11/10/23 1115 11/10/23 1319  TROPONINIHS 21* 8 6 12 10      Chemistry Recent Labs  Lab 11/06/23 2341 11/10/23 1115 11/10/23 1319 11/11/23 0236  NA 136 137  --  136  K 3.6 4.4  --  4.2  CL 107 107  --  103  CO2 18* 18*  --  20*  GLUCOSE 96 83  --  74  BUN 7 12  --  10  CREATININE 0.73 0.96  --  0.90  CALCIUM  8.1* 8.6*  --  9.0  MG  --   --  1.2* 1.1*  PROT 6.6 6.9  --  7.5  ALBUMIN  3.7 3.5  --  3.9  AST 58* 54*  --  35  ALT 36 25  --  27  ALKPHOS 66 68  --  71  BILITOT 0.7 0.9  --  1.4*  GFRNONAA >60 >60  --  >60  ANIONGAP 11 12  --  13    Lipids No results for input(s): CHOL, TRIG, HDL, LABVLDL, LDLCALC, CHOLHDL in the  last 168 hours.  Hematology Recent Labs  Lab 11/06/23 2341 11/10/23 1115 11/11/23 0236  WBC 6.5 9.2 8.9  RBC 3.35* 3.45* 3.73*  HGB 9.9* 10.7* 11.7*  HCT 30.4* 32.4* 35.6*  MCV 90.7 93.9 95.4  MCH 29.6 31.0 31.4  MCHC 32.6 33.0 32.9  RDW 17.6* 16.9* 16.5*  PLT 203 172 155   Thyroid  No results for input(s): TSH, FREET4 in the last 168 hours.  BNPNo results for input(s): BNP, PROBNP in the last 168 hours.  DDimer  Recent Labs  Lab 11/07/23 0113  DDIMER 0.29     Radiology  ECHOCARDIOGRAM COMPLETE Result Date: 11/10/2023    ECHOCARDIOGRAM REPORT   Patient Name:   [name] Date of Exam: 11/10/2023 Medical Rec #:  [id number]              Height:       68.0 in Accession #:    [id number]             Weight:       122.7 lb Date of Birth:  03/19/1971               BSA:          1.661 m Patient Age:    53 years               BP:           203/112 mmHg Patient Gender: M                      HR:           76 bpm. Exam Location:  Inpatient Procedure: 2D Echo (Both Spectral and Color Flow Doppler were utilized during            procedure). Indications:    syncope  History:         Patient has prior history of Echocardiogram examinations, most                 recent 02/16/2022. Arrythmias:WPW; Risk Factors:Hypertension,                 Current Smoker and polysubstance abuse.  Sonographer:    Tinnie Barefoot RDCS Referring Phys: [id number] [name] IMPRESSIONS  1. Left ventricular ejection fraction, by estimation, is 70 to 75%. The left ventricle has hyperdynamic function. The left ventricle has no regional wall motion abnormalities. Left ventricular diastolic parameters are consistent with Grade I diastolic dysfunction (impaired relaxation).  2. Right ventricular systolic function is normal. The right ventricular size is normal. There is mildly elevated pulmonary artery systolic pressure. The estimated right ventricular systolic pressure is 44.0 mmHg.  3. The mitral valve is normal in structure. No evidence of mitral valve regurgitation. No evidence of mitral stenosis.  4. The aortic valve has an indeterminant number of cusps. Aortic valve regurgitation is not visualized. No aortic stenosis is present.  5. The inferior vena cava is normal in size with <50% respiratory variability, suggesting right atrial pressure of 8 mmHg. Comparison(s): No significant change from prior study. FINDINGS  Left Ventricle: Left ventricular ejection fraction, by estimation, is 70 to 75%. The left ventricle has hyperdynamic function. The left ventricle has no regional wall motion abnormalities. Strain was performed and the global longitudinal strain is indeterminate. The left ventricular internal cavity size was normal in size. There is no left ventricular hypertrophy. Left ventricular diastolic parameters are consistent with Grade I diastolic dysfunction (impaired relaxation).  Right Ventricle: The right ventricular size is normal. No increase in right ventricular wall thickness. Right ventricular systolic function is normal. There is mildly elevated pulmonary artery systolic pressure. The tricuspid  regurgitant velocity is 3.00  m/s, and with an assumed right atrial pressure of 8 mmHg, the estimated right ventricular systolic pressure is 44.0 mmHg. Left Atrium: Left atrial size was normal in size. Right Atrium: Right atrial size was normal in size. Pericardium: There is no evidence of pericardial effusion. Mitral Valve: The mitral valve is normal in structure. No evidence of mitral valve regurgitation. No evidence of mitral valve stenosis. Tricuspid Valve: The tricuspid valve is normal in structure. Tricuspid valve regurgitation is trivial. No evidence of tricuspid stenosis. Aortic Valve: The aortic valve has an indeterminant number of cusps. Aortic valve regurgitation is not visualized. No aortic stenosis is present. Pulmonic Valve: The pulmonic valve was not well visualized. Pulmonic valve regurgitation is not visualized. No evidence of pulmonic stenosis. Aorta: The aortic root and ascending aorta are structurally normal, with no evidence of dilitation. Venous: The inferior vena cava is normal in size with less than 50% respiratory variability, suggesting right atrial pressure of 8 mmHg. IAS/Shunts: No atrial level shunt detected by color flow Doppler. Additional Comments: 3D was performed not requiring image post processing on an independent workstation and was indeterminate.  LEFT VENTRICLE PLAX 2D LVIDd:         4.40 cm   Diastology LVIDs:         2.60 cm   LV e' medial:    11.00 cm/s LV PW:         0.80 cm   LV E/e' medial:  11.1 LV IVS:        0.60 cm   LV e' lateral:   12.60 cm/s LVOT diam:     1.90 cm   LV E/e' lateral: 9.7 LV SV:         58 LV SV Index:   35 LVOT Area:     2.84 cm  RIGHT VENTRICLE             IVC RV Basal diam:  2.50 cm     IVC diam: 2.00 cm RV S prime:     14.80 cm/s TAPSE (M-mode): 1.8 cm LEFT ATRIUM             Index        RIGHT ATRIUM          Index LA diam:        3.00 cm 1.81 cm/m   RA Area:     9.63 cm LA Vol (A2C):   37.3 ml 22.46 ml/m  RA Volume:   19.90 ml 11.98 ml/m  LA Vol (A4C):   31.1 ml 18.73 ml/m LA Biplane Vol: 34.4 ml 20.72 ml/m  AORTIC VALVE LVOT Vmax:   117.00 cm/s LVOT Vmean:  77.300 cm/s LVOT VTI:    0.206 m  AORTA Ao Root diam: 2.70 cm Ao Asc diam:  2.60 cm MITRAL VALVE                TRICUSPID VALVE MV Area (PHT): 5.02 cm     TR Peak grad:   36.0 mmHg MV Decel Time: 151 msec     TR Vmax:        300.00 cm/s MV E velocity: 122.00 cm/s MV A velocity: 96.30 cm/s   SHUNTS MV E/A ratio:  1.27         Systemic VTI:  0.21 m                             Systemic Diam: 1.90 cm Vishnu Priya Mallipeddi Electronically signed by Diannah Late Mallipeddi Signature Date/Time: 11/10/2023/4:55:23 PM    Final    CT Head Wo Contrast Result Date: 11/10/2023 CLINICAL DATA:  Ataxia.  Head trauma. EXAM: CT HEAD WITHOUT CONTRAST TECHNIQUE: Contiguous axial images were obtained from the base of the skull through the vertex without intravenous contrast. RADIATION DOSE REDUCTION: This exam was performed according to the departmental dose-optimization program which includes automated exposure control, adjustment of the mA and/or kV according to patient size and/or use of iterative reconstruction technique. COMPARISON:  CT 10/19/2023 FINDINGS: Brain: No acute intracranial hemorrhage. No focal mass lesion. No CT evidence of acute infarction. No midline shift or mass effect. No hydrocephalus. Basilar cisterns are patent. Vascular: No hyperdense vessel or unexpected calcification. Skull: Normal. Negative for fracture or focal lesion. Sinuses/Orbits: Paranasal sinuses and mastoid air cells are clear. Orbits are clear. Postsurgical hardware in the medial LEFT orbit. Other: None. IMPRESSION: 1. No acute intracranial findings. 2. Postsurgical changes in the LEFT orbit. Electronically Signed   By: Jackquline Boxer M.D.   On: 11/10/2023 11:57   DG Wrist Complete Left Result Date: 11/10/2023 CLINICAL DATA:  Unwitnessed fall.  Left wrist pain and deformity. EXAM: LEFT WRIST - COMPLETE 3+ VIEW  COMPARISON:  None Available. FINDINGS: Three views are submitted. The mineralization is adequate. There is a nondisplaced ulnar styloid fracture. Probable nondisplaced fracture of the distal radius with mild dorsal cortical irregularity. No evidence of dislocation or carpal bone fracture. IV tubing in place. Moderate soft tissue swelling around the wrist without evidence of unexpected foreign body or soft tissue emphysema. IMPRESSION: Nondisplaced ulnar styloid fracture. Probable nondisplaced distal radius fracture. No dislocation. Electronically Signed   By: Elsie Perone M.D.   On: 11/10/2023 11:43    Cardiac Studies   Patient Profile   53 y.o. male is a 53 year old M known to have WPW s/p ablation in 2014/2015, polysubstance abuse (cocaine, alcohol ), chronic pancreatitis presented to ER with syncope.   Assessment & Plan  Syncope: Rule out cardiac etiology.  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  Echocardiogram today showed normal LVEF and no valvular heart disease.,  Unremarkable.  Obtain 2-week event monitor, live upon discharge.    Hypertensive urgency: BP on arrival is around 200 mmHg SBP, repeat BP during my interview is around 194 mmHg SBP.  Home BPs range around 120 to 130 mmHg SBP.  Acute elevation in BP likely secondary to alcohol  withdrawal versus/and acute cocaine use. Obtain rapid urine drug screen and serum alcohol  level.  Recommend to start benzodiazepines, switch metoprolol  to carvedilol  3.125 mg twice daily (due to active cocaine use) and uptitrate to keep BP under control.   Polysubstance abuse:  He continues to drink alcohol , last drink was 2 days ago.  He continues to use cocaine.  UDS positive for opiates, cocaine, and benzodiazepines  Noncardiac chest pain: Chest pain likely secondary to hypertensive urgency versus/and polysubstance abuse.  Troponins normal.   ?WPW: reports diagnosis of WPW status post ablation in 2014.  He  claims this was done at [facility].  From review of records, there was no ablation done in 2014, he did have cardiac catheterization done then.  I am not sure that he actually has WPW.  He has short PR interval but no delta wave on EKG, this has been unchanged on prior EKGs for years.  I reviewed this with EP.  No contraindication to beta-blocker.  Recommend cardiac monitor for further evaluation.  For questions or updates, please contact [facility] Please consult [email or web address] for contact info under     Signed, Lonni LITTIE Nanas, MD  11/11/2023, 10:45 AM

## 2023-11-12 DIAGNOSIS — R55 Syncope and collapse: Secondary | ICD-10-CM | POA: Diagnosis not present

## 2023-11-12 DIAGNOSIS — I160 Hypertensive urgency: Secondary | ICD-10-CM

## 2023-11-12 LAB — BASIC METABOLIC PANEL WITH GFR
Anion gap: 11 (ref 5–15)
BUN: 10 mg/dL (ref 6–20)
CO2: 20 mmol/L — ABNORMAL LOW (ref 22–32)
Calcium: 9 mg/dL (ref 8.9–10.3)
Chloride: 106 mmol/L (ref 98–111)
Creatinine, Ser: 0.91 mg/dL (ref 0.61–1.24)
GFR, Estimated: >60 mL/min (ref >60)
Glucose, Bld: 123 mg/dL — ABNORMAL HIGH (ref 70–99)
Potassium: 4.1 mmol/L (ref 3.5–5.1)
Sodium: 137 mmol/L (ref 135–145)

## 2023-11-12 LAB — MAGNESIUM: Magnesium: 1.4 mg/dL — ABNORMAL LOW (ref 1.7–2.4)

## 2023-11-12 LAB — PHOSPHORUS: Phosphorus: 3.9 mg/dL (ref 2.5–4.6)

## 2023-11-12 LAB — GLUCOSE, CAPILLARY: Glucose-Capillary: 141 mg/dL — ABNORMAL HIGH (ref 70–99)

## 2023-11-12 MED ORDER — MAGNESIUM OXIDE -MG SUPPLEMENT 400 (240 MG) MG PO TABS
800.0000 mg | ORAL_TABLET | Freq: Once | ORAL | Status: AC
  Administered 2023-11-12: 800 mg via ORAL
  Filled 2023-11-12: qty 2

## 2023-11-12 MED ORDER — BENZONATATE 100 MG PO CAPS
100.0000 mg | ORAL_CAPSULE | Freq: Once | ORAL | Status: DC | PRN
  Filled 2023-11-12: qty 1

## 2023-11-12 MED ORDER — MAGNESIUM SULFATE 4 GM/100ML IV SOLN
4.0000 g | Freq: Once | INTRAVENOUS | Status: AC
  Administered 2023-11-12: 4 g via INTRAVENOUS
  Filled 2023-11-12: qty 100

## 2023-11-12 MED ORDER — ENSURE PLUS HIGH PROTEIN PO LIQD
237.0000 mL | Freq: Two times a day (BID) | ORAL | Status: DC
  Administered 2023-11-12 – 2023-11-13 (×3): 237 mL via ORAL

## 2023-11-12 MED ORDER — CARVEDILOL 25 MG PO TABS
25.0000 mg | ORAL_TABLET | Freq: Two times a day (BID) | ORAL | Status: DC
  Administered 2023-11-12 – 2023-11-13 (×3): 25 mg via ORAL
  Filled 2023-11-12 (×3): qty 1

## 2023-11-12 NOTE — Progress Notes (Signed)
 Rounding Note   Patient Name: [name] Date of Encounter: 11/12/2023  [facility] Cardiologist: Lynwood Schilling, MD   Subjective BP 165/106.  Creatinine 0.9, magnesium  1.4.  Reports had some lightheadedness this morning  Scheduled Meds:  carvedilol   25 mg Oral BID WC   famotidine   40 mg Oral Daily   feeding supplement  237 mL Oral BID BM   folic acid   1 mg Oral Daily   heparin   5,000 Units Subcutaneous Q8H   lipase/protease/amylase  72,000 Units Oral TID WC   multivitamin with minerals  1 tablet Oral Daily   nicotine   21 mg Transdermal Daily   pantoprazole   20 mg Oral Daily   sodium chloride  flush  3 mL Intravenous Q12H   sucralfate   1 g Oral TID WC & HS   thiamine   100 mg Oral Daily   Or   thiamine   100 mg Intravenous Daily   Continuous Infusions:  magnesium  sulfate bolus IVPB 4 g (11/12/23 0919)   PRN Meds: acetaminophen  **OR** acetaminophen , benzonatate , hydrALAZINE , HYDROmorphone  (DILAUDID ) injection, lidocaine , lipase/protease/amylase **AND** lipase/protease/amylase, LORazepam  **OR** LORazepam , ondansetron  (ZOFRAN ) IV, oxyCODONE , polyethylene glycol   Vital Signs  Vitals:   11/12/23 0353 11/12/23 0600 11/12/23 0732 11/12/23 0920  BP: (!) 163/98   (!) 165/106  Pulse:  70 75   Resp:   18   Temp: 97.7 F (36.5 C)  98.6 F (37 C) 98.6 F (37 C)  TempSrc: Oral  Oral Oral  SpO2: 100%     Weight: 55.2 kg     Height:        Intake/Output Summary (Last 24 hours) at 11/12/2023 0935 Last data filed at 11/12/2023 0600 Gross per 24 hour  Intake 1076.09 ml  Output 625 ml  Net 451.09 ml      11/12/2023    3:53 AM 11/11/2023    5:33 AM 11/10/2023    3:32 PM  Last 3 Weights  Weight (lbs) 121 lb 12.8 oz 120 lb 9.5 oz 122 lb 11.2 oz  Weight (kg) 55.248 kg 54.7 kg 55.656 kg      Telemetry Normal sinus rhythm- Personally Reviewed  ECG   ECG- Personally Reviewed  Physical Exam  GEN: No acute distress.   Neck: No JVD Cardiac: RRR, no murmurs,  rubs, or gallops.  Respiratory: Clear to auscultation bilaterally. GI: Soft, nontender, non-distended  MS: No edema; left arm in Ace wrap Neuro:  Nonfocal  Psych: Normal affect   Labs High Sensitivity Troponin:   Recent Labs  Lab 10/21/23 2231 11/06/23 2341 11/07/23 0113 11/10/23 1115 11/10/23 1319  TROPONINIHS 21* 8 6 12 10      Chemistry Recent Labs  Lab 11/06/23 2341 11/10/23 1115 11/10/23 1319 11/11/23 0236 11/12/23 0500  NA 136 137  --  136 137  K 3.6 4.4  --  4.2 4.1  CL 107 107  --  103 106  CO2 18* 18*  --  20* 20*  GLUCOSE 96 83  --  74 123*  BUN 7 12  --  10 10  CREATININE 0.73 0.96  --  0.90 0.91  CALCIUM  8.1* 8.6*  --  9.0 9.0  MG  --   --  1.2* 1.1* 1.4*  PROT 6.6 6.9  --  7.5  --   ALBUMIN  3.7 3.5  --  3.9  --   AST 58* 54*  --  35  --   ALT 36 25  --  27  --   ALKPHOS 66  68  --  71  --   BILITOT 0.7 0.9  --  1.4*  --   GFRNONAA >60 >60  --  >60 >60  ANIONGAP 11 12  --  13 11    Lipids No results for input(s): CHOL, TRIG, HDL, LABVLDL, LDLCALC, CHOLHDL in the last 168 hours.  Hematology Recent Labs  Lab 11/06/23 2341 11/10/23 1115 11/11/23 0236  WBC 6.5 9.2 8.9  RBC 3.35* 3.45* 3.73*  HGB 9.9* 10.7* 11.7*  HCT 30.4* 32.4* 35.6*  MCV 90.7 93.9 95.4  MCH 29.6 31.0 31.4  MCHC 32.6 33.0 32.9  RDW 17.6* 16.9* 16.5*  PLT 203 172 155   Thyroid  No results for input(s): TSH, FREET4 in the last 168 hours.  BNPNo results for input(s): BNP, PROBNP in the last 168 hours.  DDimer  Recent Labs  Lab 11/07/23 0113  DDIMER 0.29     Radiology  ECHOCARDIOGRAM COMPLETE Result Date: 11/10/2023    ECHOCARDIOGRAM REPORT   Patient Name:   [name] Date of Exam: 11/10/2023 Medical Rec #:  [id number]              Height:       68.0 in Accession #:    [id number]             Weight:       122.7 lb Date of Birth:  [birth year]               BSA:          1.661 m Patient Age:    53 years               BP:           203/112 mmHg Patient  Gender: M                      HR:           76 bpm. Exam Location:  Inpatient Procedure: 2D Echo (Both Spectral and Color Flow Doppler were utilized during            procedure). Indications:    syncope  History:        Patient has prior history of Echocardiogram examinations, most                 recent 02/16/2022. Arrythmias:WPW; Risk Factors:Hypertension,                 Current Smoker and polysubstance abuse.  Sonographer:    Tinnie Barefoot RDCS Referring Phys: [id number] MARSA NOVAK MELVIN IMPRESSIONS  1. Left ventricular ejection fraction, by estimation, is 70 to 75%. The left ventricle has hyperdynamic function. The left ventricle has no regional wall motion abnormalities. Left ventricular diastolic parameters are consistent with Grade I diastolic dysfunction (impaired relaxation).  2. Right ventricular systolic function is normal. The right ventricular size is normal. There is mildly elevated pulmonary artery systolic pressure. The estimated right ventricular systolic pressure is 44.0 mmHg.  3. The mitral valve is normal in structure. No evidence of mitral valve regurgitation. No evidence of mitral stenosis.  4. The aortic valve has an indeterminant number of cusps. Aortic valve regurgitation is not visualized. No aortic stenosis is present.  5. The inferior vena cava is normal in size with <50% respiratory variability, suggesting right atrial pressure of 8 mmHg. Comparison(s): No significant change from prior study. FINDINGS  Left Ventricle: Left ventricular ejection fraction, by estimation, is 70 to 75%. The  left ventricle has hyperdynamic function. The left ventricle has no regional wall motion abnormalities. Strain was performed and the global longitudinal strain is indeterminate. The left ventricular internal cavity size was normal in size. There is no left ventricular hypertrophy. Left ventricular diastolic parameters are consistent with Grade I diastolic dysfunction (impaired relaxation). Right  Ventricle: The right ventricular size is normal. No increase in right ventricular wall thickness. Right ventricular systolic function is normal. There is mildly elevated pulmonary artery systolic pressure. The tricuspid regurgitant velocity is 3.00  m/s, and with an assumed right atrial pressure of 8 mmHg, the estimated right ventricular systolic pressure is 44.0 mmHg. Left Atrium: Left atrial size was normal in size. Right Atrium: Right atrial size was normal in size. Pericardium: There is no evidence of pericardial effusion. Mitral Valve: The mitral valve is normal in structure. No evidence of mitral valve regurgitation. No evidence of mitral valve stenosis. Tricuspid Valve: The tricuspid valve is normal in structure. Tricuspid valve regurgitation is trivial. No evidence of tricuspid stenosis. Aortic Valve: The aortic valve has an indeterminant number of cusps. Aortic valve regurgitation is not visualized. No aortic stenosis is present. Pulmonic Valve: The pulmonic valve was not well visualized. Pulmonic valve regurgitation is not visualized. No evidence of pulmonic stenosis. Aorta: The aortic root and ascending aorta are structurally normal, with no evidence of dilitation. Venous: The inferior vena cava is normal in size with less than 50% respiratory variability, suggesting right atrial pressure of 8 mmHg. IAS/Shunts: No atrial level shunt detected by color flow Doppler. Additional Comments: 3D was performed not requiring image post processing on an independent workstation and was indeterminate.  LEFT VENTRICLE PLAX 2D LVIDd:         4.40 cm   Diastology LVIDs:         2.60 cm   LV e' medial:    11.00 cm/s LV PW:         0.80 cm   LV E/e' medial:  11.1 LV IVS:        0.60 cm   LV e' lateral:   12.60 cm/s LVOT diam:     1.90 cm   LV E/e' lateral: 9.7 LV SV:         58 LV SV Index:   35 LVOT Area:     2.84 cm  RIGHT VENTRICLE             IVC RV Basal diam:  2.50 cm     IVC diam: 2.00 cm RV S prime:     14.80 cm/s  TAPSE (M-mode): 1.8 cm LEFT ATRIUM             Index        RIGHT ATRIUM          Index LA diam:        3.00 cm 1.81 cm/m   RA Area:     9.63 cm LA Vol (A2C):   37.3 ml 22.46 ml/m  RA Volume:   19.90 ml 11.98 ml/m LA Vol (A4C):   31.1 ml 18.73 ml/m LA Biplane Vol: 34.4 ml 20.72 ml/m  AORTIC VALVE LVOT Vmax:   117.00 cm/s LVOT Vmean:  77.300 cm/s LVOT VTI:    0.206 m  AORTA Ao Root diam: 2.70 cm Ao Asc diam:  2.60 cm MITRAL VALVE                TRICUSPID VALVE MV Area (PHT): 5.02 cm     TR Peak grad:  36.0 mmHg MV Decel Time: 151 msec     TR Vmax:        300.00 cm/s MV E velocity: 122.00 cm/s MV A velocity: 96.30 cm/s   SHUNTS MV E/A ratio:  1.27         Systemic VTI:  0.21 m                             Systemic Diam: 1.90 cm Vishnu Priya Mallipeddi Electronically signed by Diannah Late Mallipeddi Signature Date/Time: 11/10/2023/4:55:23 PM    Final    CT Head Wo Contrast Result Date: 11/10/2023 CLINICAL DATA:  Ataxia.  Head trauma. EXAM: CT HEAD WITHOUT CONTRAST TECHNIQUE: Contiguous axial images were obtained from the base of the skull through the vertex without intravenous contrast. RADIATION DOSE REDUCTION: This exam was performed according to the departmental dose-optimization program which includes automated exposure control, adjustment of the mA and/or kV according to patient size and/or use of iterative reconstruction technique. COMPARISON:  CT 10/19/2023 FINDINGS: Brain: No acute intracranial hemorrhage. No focal mass lesion. No CT evidence of acute infarction. No midline shift or mass effect. No hydrocephalus. Basilar cisterns are patent. Vascular: No hyperdense vessel or unexpected calcification. Skull: Normal. Negative for fracture or focal lesion. Sinuses/Orbits: Paranasal sinuses and mastoid air cells are clear. Orbits are clear. Postsurgical hardware in the medial LEFT orbit. Other: None. IMPRESSION: 1. No acute intracranial findings. 2. Postsurgical changes in the LEFT orbit. Electronically  Signed   By: Jackquline Boxer M.D.   On: 11/10/2023 11:57   DG Wrist Complete Left Result Date: 11/10/2023 CLINICAL DATA:  Unwitnessed fall.  Left wrist pain and deformity. EXAM: LEFT WRIST - COMPLETE 3+ VIEW COMPARISON:  None Available. FINDINGS: Three views are submitted. The mineralization is adequate. There is a nondisplaced ulnar styloid fracture. Probable nondisplaced fracture of the distal radius with mild dorsal cortical irregularity. No evidence of dislocation or carpal bone fracture. IV tubing in place. Moderate soft tissue swelling around the wrist without evidence of unexpected foreign body or soft tissue emphysema. IMPRESSION: Nondisplaced ulnar styloid fracture. Probable nondisplaced distal radius fracture. No dislocation. Electronically Signed   By: Elsie Perone M.D.   On: 11/10/2023 11:43    Cardiac Studies   Patient Profile   53 y.o. male is a 53 year old M known to have WPW s/p ablation in 2014/2015, polysubstance abuse (cocaine, alcohol ), chronic pancreatitis presented to ER with syncope.   Assessment & Plan  Syncope: Rule out cardiac etiology.  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  Echocardiogram showed normal LVEF and no valvular heart disease. - Short PR interval but no delta wave.  Carries diagnosis of WPW and reports has had ablation at [facility] but there is no record of this.  I do not think he has actually had an ablation and the diagnosis of WPW is questionable.  Recommend live Zio patch x 2 weeks on discharge  Hypertensive urgency: BP on arrival is around 200 mmHg SBP. Acute elevation in BP likely secondary to alcohol  withdrawal versus/and acute cocaine use. Switched metoprolol  to carvedilol  due to active cocaine use, can titrate as needed for BP control   Polysubstance abuse:  He continues to drink alcohol , last drink was 2 days ago.  He continues to use cocaine.  UDS positive for opiates, cocaine, and benzodiazepines.   Cessation recommended  Noncardiac chest pain: Chest pain likely secondary to hypertensive urgency  versus/and polysubstance abuse.  Troponins normal.   ?WPW: reports diagnosis of WPW status post ablation in 2014.  He claims this was done at [facility].  From review of records, there was no ablation done in 2014, he did have cardiac catheterization done then.  I am not sure that he actually has WPW.  He has short PR interval but no delta wave on EKG, this has been unchanged on prior EKGs for years.  I reviewed this with EP.  No contraindication to beta-blocker.  Recommend cardiac monitor for further evaluation.  For questions or updates, please contact [facility] Please consult [email or web address] for contact info under     Signed, Lonni LITTIE Nanas, MD  11/12/2023, 9:35 AM

## 2023-11-12 NOTE — Progress Notes (Signed)
 PROGRESS NOTE    [name]  FMW:[id number] DOB: [birth year] DOA: 11/10/2023 PCP: Celestia Rosaline SQUIBB, NP    Brief Narrative:  53 year old with history of WPW status post ablation in 2015, polysubstance abuse, chronic pancreatitis comes to the ER with complaints of syncope.  In the ER noted to have tachycardia, relatively flat troponin.  EKG showed normal sinus rhythm, echocardiogram showed normal preserved EF.   Assessment & Plan:  Principal Problem:   Syncope and collapse Active Problems:   Paroxysmal atrial fibrillation (HCC)   Alcohol  abuse   Essential hypertension   Polysubstance abuse (tobacco, cocaine, THC, and ETOH)   Chronic pancreatitis (HCC)   Major depressive disorder, recurrent severe without psychotic features (HCC)   Normocytic anemia   GERD (gastroesophageal reflux disease)   Seizure (HCC)   WPW (Wolff-Parkinson-White syndrome)   NSVT (nonsustained ventricular tachycardia) (HCC)   Non-cardiac chest pain    Syncope and collapse History of WPW No clear etiology at this time.  EKG is relatively unremarkable with short PR.  Echocardiogram shows preserved EF.  Seen by cardiology.  Would benefit from ambulatory cardiac monitor  Chronic pancreatitis with persistent nausea vomiting - Continue home Creon  -Place him on full liquid diet  Paroxysmal A-fib Now on Coreg  per cardiology.  Currently normal sinus rhythm   Hypertension; uncontrolled labile.  Uptitrate Coreg  as needed.   HypoMg as needed   GERD - Continue home PPI, Pepcid       Wrist fracture Left-sided wrist fracture likely from fall.  Splint placed in the ER.  Will need outpatient follow-up with orthopedic   Chronic pain Pain control with bowel regimen    Anemia Hemoglobin stable around 11.0   Depression - Not currently on medication for this   Substance use Positive for benzodiazepine, opioids and cocaine.  Counseled to quit using this   History of seizure - Not currently on  any antiepileptics     DVT prophylaxis: heparin  injection 5,000 Units Start: 11/10/23 2200    Code Status: Full Code Family Communication:   Discharge once cleared by cardiology and is tolerating p.o.   Subjective: Nursing staff reports that patient continues to pace up and down the hallway asking for narcotic and Ativan .  He is also been ordering extra greasy food from the cafeteria. Reporting of nausea vomiting to me this morning along with abdominal pain.  He ordered 2 trays of food.  Examination:  General exam: Appears calm and comfortable  Respiratory system: Clear to auscultation. Respiratory effort normal. Cardiovascular system: S1 & S2 heard, RRR. No JVD, murmurs, rubs, gallops or clicks. No pedal edema. Gastrointestinal system: Abdomen is nondistended, soft and nontender. No organomegaly or masses felt. Normal bowel sounds heard. Central nervous system: Alert and oriented. No focal neurological deficits. Extremities: Symmetric 5 x 5 power. Skin: No rashes, lesions or ulcers Psychiatry: Judgement and insight appear normal. Mood & affect appropriate.                Diet Orders (From admission, onward)     Start     Ordered   11/12/23 0936  Diet full liquid Room service appropriate? Yes; Fluid consistency: Thin  Diet effective now       Question Answer Comment  Room service appropriate? Yes   Fluid consistency: Thin      11/12/23 0935            Objective: Vitals:   11/12/23 0353 11/12/23 0600 11/12/23 0732 11/12/23 0920  BP: (!) 163/98   ROLLEN)  165/106  Pulse:  70 75   Resp:   18   Temp: 97.7 F (36.5 C)  98.6 F (37 C) 98.6 F (37 C)  TempSrc: Oral  Oral Oral  SpO2: 100%     Weight: 55.2 kg     Height:        Intake/Output Summary (Last 24 hours) at 11/12/2023 0952 Last data filed at 11/12/2023 0600 Gross per 24 hour  Intake 1076.09 ml  Output 625 ml  Net 451.09 ml   Filed Weights   11/10/23 1532 11/11/23 0533 11/12/23 0353  Weight: 55.7  kg 54.7 kg 55.2 kg    Scheduled Meds:  carvedilol   25 mg Oral BID WC   famotidine   40 mg Oral Daily   feeding supplement  237 mL Oral BID BM   folic acid   1 mg Oral Daily   heparin   5,000 Units Subcutaneous Q8H   lipase/protease/amylase  72,000 Units Oral TID WC   multivitamin with minerals  1 tablet Oral Daily   nicotine   21 mg Transdermal Daily   pantoprazole   20 mg Oral Daily   sodium chloride  flush  3 mL Intravenous Q12H   sucralfate   1 g Oral TID WC & HS   thiamine   100 mg Oral Daily   Or   thiamine   100 mg Intravenous Daily   Continuous Infusions:  magnesium  sulfate bolus IVPB 4 g (11/12/23 0919)    Nutritional status     Body mass index is 18.52 kg/m.  Data Reviewed:   CBC: Recent Labs  Lab 11/06/23 2341 11/10/23 1115 11/11/23 0236  WBC 6.5 9.2 8.9  NEUTROABS  --  6.1  --   HGB 9.9* 10.7* 11.7*  HCT 30.4* 32.4* 35.6*  MCV 90.7 93.9 95.4  PLT 203 172 155   Basic Metabolic Panel: Recent Labs  Lab 11/06/23 2341 11/10/23 1115 11/10/23 1319 11/11/23 0236 11/12/23 0500 11/12/23 0819  NA 136 137  --  136 137  --   K 3.6 4.4  --  4.2 4.1  --   CL 107 107  --  103 106  --   CO2 18* 18*  --  20* 20*  --   GLUCOSE 96 83  --  74 123*  --   BUN 7 12  --  10 10  --   CREATININE 0.73 0.96  --  0.90 0.91  --   CALCIUM  8.1* 8.6*  --  9.0 9.0  --   MG  --   --  1.2* 1.1* 1.4*  --   PHOS  --   --   --   --   --  3.9   GFR: Estimated Creatinine Clearance: 72.5 mL/min (by C-G formula based on SCr of 0.91 mg/dL). Liver Function Tests: Recent Labs  Lab 11/06/23 2341 11/10/23 1115 11/11/23 0236  AST 58* 54* 35  ALT 36 25 27  ALKPHOS 66 68 71  BILITOT 0.7 0.9 1.4*  PROT 6.6 6.9 7.5  ALBUMIN  3.7 3.5 3.9   Recent Labs  Lab 11/06/23 2341 11/10/23 1115  LIPASE 27 22   No results for input(s): AMMONIA in the last 168 hours. Coagulation Profile: No results for input(s): INR, PROTIME in the last 168 hours. Cardiac Enzymes: No results for input(s):  CKTOTAL, CKMB, CKMBINDEX, TROPONINI in the last 168 hours. BNP (last 3 results) No results for input(s): PROBNP in the last 8760 hours. HbA1C: No results for input(s): HGBA1C in the last 72 hours. CBG: Recent Labs  Lab 11/11/23 0535 11/11/23 0702 11/12/23 0505  GLUCAP 61* 129* 141*   Lipid Profile: No results for input(s): CHOL, HDL, LDLCALC, TRIG, CHOLHDL, LDLDIRECT in the last 72 hours. Thyroid  Function Tests: No results for input(s): TSH, T4TOTAL, FREET4, T3FREE, THYROIDAB in the last 72 hours. Anemia Panel: No results for input(s): VITAMINB12, FOLATE, FERRITIN, TIBC, IRON, RETICCTPCT in the last 72 hours. Sepsis Labs: No results for input(s): PROCALCITON, LATICACIDVEN in the last 168 hours.  No results found for this or any previous visit (from the past 240 hours).       Radiology Studies: ECHOCARDIOGRAM COMPLETE Result Date: 11/10/2023    ECHOCARDIOGRAM REPORT   Patient Name:   [name] Date of Exam: 11/10/2023 Medical Rec #:  [id number]              Height:       68.0 in Accession #:    [id number]             Weight:       122.7 lb Date of Birth:  1971/01/16               BSA:          1.661 m Patient Age:    53 years               BP:           203/112 mmHg Patient Gender: M                      HR:           76 bpm. Exam Location:  Inpatient Procedure: 2D Echo (Both Spectral and Color Flow Doppler were utilized during            procedure). Indications:    syncope  History:        Patient has prior history of Echocardiogram examinations, most                 recent 02/16/2022. Arrythmias:WPW; Risk Factors:Hypertension,                 Current Smoker and polysubstance abuse.  Sonographer:    Tinnie Barefoot RDCS Referring Phys: [id number] MARSA NOVAK MELVIN IMPRESSIONS  1. Left ventricular ejection fraction, by estimation, is 70 to 75%. The left ventricle has hyperdynamic function. The left ventricle has no regional wall  motion abnormalities. Left ventricular diastolic parameters are consistent with Grade I diastolic dysfunction (impaired relaxation).  2. Right ventricular systolic function is normal. The right ventricular size is normal. There is mildly elevated pulmonary artery systolic pressure. The estimated right ventricular systolic pressure is 44.0 mmHg.  3. The mitral valve is normal in structure. No evidence of mitral valve regurgitation. No evidence of mitral stenosis.  4. The aortic valve has an indeterminant number of cusps. Aortic valve regurgitation is not visualized. No aortic stenosis is present.  5. The inferior vena cava is normal in size with <50% respiratory variability, suggesting right atrial pressure of 8 mmHg. Comparison(s): No significant change from prior study. FINDINGS  Left Ventricle: Left ventricular ejection fraction, by estimation, is 70 to 75%. The left ventricle has hyperdynamic function. The left ventricle has no regional wall motion abnormalities. Strain was performed and the global longitudinal strain is indeterminate. The left ventricular internal cavity size was normal in size. There is no left ventricular hypertrophy. Left ventricular diastolic parameters are consistent with Grade I diastolic dysfunction (impaired relaxation). Right Ventricle: The  right ventricular size is normal. No increase in right ventricular wall thickness. Right ventricular systolic function is normal. There is mildly elevated pulmonary artery systolic pressure. The tricuspid regurgitant velocity is 3.00  m/s, and with an assumed right atrial pressure of 8 mmHg, the estimated right ventricular systolic pressure is 44.0 mmHg. Left Atrium: Left atrial size was normal in size. Right Atrium: Right atrial size was normal in size. Pericardium: There is no evidence of pericardial effusion. Mitral Valve: The mitral valve is normal in structure. No evidence of mitral valve regurgitation. No evidence of mitral valve stenosis.  Tricuspid Valve: The tricuspid valve is normal in structure. Tricuspid valve regurgitation is trivial. No evidence of tricuspid stenosis. Aortic Valve: The aortic valve has an indeterminant number of cusps. Aortic valve regurgitation is not visualized. No aortic stenosis is present. Pulmonic Valve: The pulmonic valve was not well visualized. Pulmonic valve regurgitation is not visualized. No evidence of pulmonic stenosis. Aorta: The aortic root and ascending aorta are structurally normal, with no evidence of dilitation. Venous: The inferior vena cava is normal in size with less than 50% respiratory variability, suggesting right atrial pressure of 8 mmHg. IAS/Shunts: No atrial level shunt detected by color flow Doppler. Additional Comments: 3D was performed not requiring image post processing on an independent workstation and was indeterminate.  LEFT VENTRICLE PLAX 2D LVIDd:         4.40 cm   Diastology LVIDs:         2.60 cm   LV e' medial:    11.00 cm/s LV PW:         0.80 cm   LV E/e' medial:  11.1 LV IVS:        0.60 cm   LV e' lateral:   12.60 cm/s LVOT diam:     1.90 cm   LV E/e' lateral: 9.7 LV SV:         58 LV SV Index:   35 LVOT Area:     2.84 cm  RIGHT VENTRICLE             IVC RV Basal diam:  2.50 cm     IVC diam: 2.00 cm RV S prime:     14.80 cm/s TAPSE (M-mode): 1.8 cm LEFT ATRIUM             Index        RIGHT ATRIUM          Index LA diam:        3.00 cm 1.81 cm/m   RA Area:     9.63 cm LA Vol (A2C):   37.3 ml 22.46 ml/m  RA Volume:   19.90 ml 11.98 ml/m LA Vol (A4C):   31.1 ml 18.73 ml/m LA Biplane Vol: 34.4 ml 20.72 ml/m  AORTIC VALVE LVOT Vmax:   117.00 cm/s LVOT Vmean:  77.300 cm/s LVOT VTI:    0.206 m  AORTA Ao Root diam: 2.70 cm Ao Asc diam:  2.60 cm MITRAL VALVE                TRICUSPID VALVE MV Area (PHT): 5.02 cm     TR Peak grad:   36.0 mmHg MV Decel Time: 151 msec     TR Vmax:        300.00 cm/s MV E velocity: 122.00 cm/s MV A velocity: 96.30 cm/s   SHUNTS MV E/A ratio:  1.27          Systemic VTI:  0.21 m  Systemic Diam: 1.90 cm [name] Electronically signed by [name] Signature Date/Time: 11/10/2023/4:55:23 PM    Final    CT Head Wo Contrast Result Date: 11/10/2023 CLINICAL DATA:  Ataxia.  Head trauma. EXAM: CT HEAD WITHOUT CONTRAST TECHNIQUE: Contiguous axial images were obtained from the base of the skull through the vertex without intravenous contrast. RADIATION DOSE REDUCTION: This exam was performed according to the departmental dose-optimization program which includes automated exposure control, adjustment of the mA and/or kV according to patient size and/or use of iterative reconstruction technique. COMPARISON:  CT 10/19/2023 FINDINGS: Brain: No acute intracranial hemorrhage. No focal mass lesion. No CT evidence of acute infarction. No midline shift or mass effect. No hydrocephalus. Basilar cisterns are patent. Vascular: No hyperdense vessel or unexpected calcification. Skull: Normal. Negative for fracture or focal lesion. Sinuses/Orbits: Paranasal sinuses and mastoid air cells are clear. Orbits are clear. Postsurgical hardware in the medial LEFT orbit. Other: None. IMPRESSION: 1. No acute intracranial findings. 2. Postsurgical changes in the LEFT orbit. Electronically Signed   By: Jackquline Boxer M.D.   On: 11/10/2023 11:57   DG Wrist Complete Left Result Date: 11/10/2023 CLINICAL DATA:  Unwitnessed fall.  Left wrist pain and deformity. EXAM: LEFT WRIST - COMPLETE 3+ VIEW COMPARISON:  None Available. FINDINGS: Three views are submitted. The mineralization is adequate. There is a nondisplaced ulnar styloid fracture. Probable nondisplaced fracture of the distal radius with mild dorsal cortical irregularity. No evidence of dislocation or carpal bone fracture. IV tubing in place. Moderate soft tissue swelling around the wrist without evidence of unexpected foreign body or soft tissue emphysema. IMPRESSION: Nondisplaced ulnar  styloid fracture. Probable nondisplaced distal radius fracture. No dislocation. Electronically Signed   By: Elsie Perone M.D.   On: 11/10/2023 11:43           LOS: 1 day   Time spent= 35 mins    Burgess JAYSON Dare, MD [facility]  If 7PM-7AM, please contact night-coverage  11/12/2023, 9:52 AM

## 2023-11-12 NOTE — Progress Notes (Signed)
 Patient ID: [name], male   DOB: 1970-12-06, 53 y.o.   MRN: [id number]  Patient in hallway argumentative about the timing of his pain medication. Patient does not want to wait 4 hours in between doses of Dilaudid . Patient is cursing while walking back to room and RN heard loud bang. Upon entering room  I observed trashcan  lying in floor. When I asked what happened patient stated it just fell over. Traschcan up righted and callbell left in patient reach.  Verdie JONETTA Collier, RN

## 2023-11-12 NOTE — Progress Notes (Signed)
 Patient ID: [name], male   DOB: Sep 18, 1970, 53 y.o.   MRN: [id number]  Patient ordered two trays for breakfast. Although he has been educated on diet for pancreatitis, he has eaten bacon, and grits with six packs of butter. Patient c/o of nausea and vomited profusely. MD notified. Diet order changed. Answers given during CIWA assessment are inconsistent with nursing observations. Patient states he is having auditory and visual hallucinations but is able to ambulate in hallway and hold coherent conversations with visitors and staff. MD aware. Will continue to monitor.  Verdie JONETTA Collier, RN

## 2023-11-12 NOTE — Progress Notes (Signed)
 Patient asked about the smell of cigarette smoke in room and he denied having done anything. When patient moved over in bed an opened pack of cigarettes were found in the bed. This RN, had to confiscate and put in patient's bin in the med room. Pt is being rude to staff. Will notify day RN when care responsibilities are transferred.

## 2023-11-13 ENCOUNTER — Other Ambulatory Visit: Payer: Self-pay | Admitting: Home Health

## 2023-11-13 ENCOUNTER — Inpatient Hospital Stay (HOSPITAL_BASED_OUTPATIENT_CLINIC_OR_DEPARTMENT_OTHER)
Admit: 2023-11-13 | Discharge: 2023-11-13 | Disposition: A | Discharge status: Home / Self Care | Payer: MEDICAID | Attending: Cardiology | Admitting: Cardiology

## 2023-11-13 ENCOUNTER — Other Ambulatory Visit (HOSPITAL_COMMUNITY): Payer: Self-pay

## 2023-11-13 DIAGNOSIS — R55 Syncope and collapse: Secondary | ICD-10-CM

## 2023-11-13 LAB — BASIC METABOLIC PANEL WITH GFR
Anion gap: 12 (ref 5–15)
BUN: 13 mg/dL (ref 6–20)
CO2: 26 mmol/L (ref 22–32)
Calcium: 7.4 mg/dL — ABNORMAL LOW (ref 8.9–10.3)
Chloride: 93 mmol/L — ABNORMAL LOW (ref 98–111)
Creatinine, Ser: 0.61 mg/dL (ref 0.61–1.24)
GFR, Estimated: >60 mL/min (ref >60)
Glucose, Bld: 107 mg/dL — ABNORMAL HIGH (ref 70–99)
Potassium: 3.6 mmol/L (ref 3.5–5.1)
Sodium: 131 mmol/L — ABNORMAL LOW (ref 135–145)

## 2023-11-13 LAB — GLUCOSE, CAPILLARY: Glucose-Capillary: 115 mg/dL — ABNORMAL HIGH (ref 70–99)

## 2023-11-13 LAB — MAGNESIUM: Magnesium: 2 mg/dL (ref 1.7–2.4)

## 2023-11-13 MED ORDER — PANTOPRAZOLE SODIUM 20 MG PO TBEC
20.0000 mg | DELAYED_RELEASE_TABLET | Freq: Every day | ORAL | 0 refills | Status: DC
  Filled 2023-11-13: qty 30, 30d supply, fill #0

## 2023-11-13 MED ORDER — ONDANSETRON 4 MG PO TBDP
4.0000 mg | ORAL_TABLET | Freq: Three times a day (TID) | ORAL | 0 refills | Status: DC | PRN
  Filled 2023-11-13: qty 20, 7d supply, fill #0

## 2023-11-13 MED ORDER — CARVEDILOL 25 MG PO TABS
25.0000 mg | ORAL_TABLET | Freq: Two times a day (BID) | ORAL | 0 refills | Status: DC
  Filled 2023-11-13: qty 60, 30d supply, fill #0

## 2023-11-13 MED ORDER — POLYETHYLENE GLYCOL 3350 17 GM/SCOOP PO POWD
17.0000 g | Freq: Every day | ORAL | 0 refills | Status: AC | PRN
  Filled 2023-11-13: qty 238, 14d supply, fill #0

## 2023-11-13 MED ORDER — NICOTINE 21 MG/24HR TD PT24
21.0000 mg | MEDICATED_PATCH | Freq: Every day | TRANSDERMAL | 0 refills | Status: AC
  Filled 2023-11-13: qty 28, 28d supply, fill #0

## 2023-11-13 MED ORDER — OXYCODONE HCL 10 MG PO TABS
10.0000 mg | ORAL_TABLET | Freq: Four times a day (QID) | ORAL | 0 refills | Status: DC | PRN
  Filled 2023-11-13: qty 15, 4d supply, fill #0

## 2023-11-13 MED ORDER — POTASSIUM CHLORIDE CRYS ER 20 MEQ PO TBCR
40.0000 meq | EXTENDED_RELEASE_TABLET | Freq: Once | ORAL | Status: AC
  Administered 2023-11-13: 40 meq via ORAL
  Filled 2023-11-13: qty 2

## 2023-11-13 NOTE — Discharge Summary (Signed)
 Physician Discharge Summary  Merrill Villarruel FMW:[id number] DOB: 1970-09-13 DOA: 11/10/2023  PCP: Celestia Rosaline SQUIBB, NP  Admit date: 11/10/2023 Discharge date: 11/13/2023  Admitted From: Home Disposition: Home  Recommendations for Outpatient Follow-up:  Follow up with PCP in 1-2 weeks Please obtain BMP/CBC in one week your next doctors visit.  Brief course of pain medication due to his wrist fracture along with bowel regimen given Follow-up outpatient orthopedic for his left wrist fracture.  Information given.  He will need to make an appointment. Would benefit from outpatient pain management follow-up   Discharge Condition: Stable CODE STATUS: Full code, Diet recommendation: Cardiac  Brief/Interim Summary: Brief Narrative:  53 year old with history of WPW status post ablation in 2015, polysubstance abuse, chronic pancreatitis comes to the ER with complaints of syncope.  In the ER noted to have tachycardia, relatively flat troponin.  EKG showed normal sinus rhythm, echocardiogram showed normal preserved EF.  Eventually his heart rhythm was noted to be unremarkable, cardiology recommended outpatient cardiac monitoring. During hospitalization also had persistent nausea and vomiting requesting IV pain medications and reporting of abdominal pain.  Reportedly also throughout the day being disruptive on the floor requesting his pain medications, slamming doors and using foul language. Today he is medically stable and we will discharge him.   Assessment & Plan:  Principal Problem:   Syncope and collapse Active Problems:   Paroxysmal atrial fibrillation (HCC)   Alcohol  abuse   Essential hypertension   Polysubstance abuse (tobacco, cocaine, THC, and ETOH)   Chronic pancreatitis (HCC)   Major depressive disorder, recurrent severe without psychotic features (HCC)   Normocytic anemia   GERD (gastroesophageal reflux disease)   Seizure (HCC)   WPW (Wolff-Parkinson-White syndrome)   NSVT  (nonsustained ventricular tachycardia) (HCC)   Non-cardiac chest pain    Syncope and collapse History of WPW No clear etiology at this time.  EKG is relatively unremarkable with short PR.  Echocardiogram shows preserved EF.  Seen by cardiology.  Would benefit from ambulatory cardiac monitor  Chronic pancreatitis with persistent nausea vomiting Tums have improved.  Recommend slowly advance his diet at home. Not compliant with his diet  Paroxysmal A-fib Now on Coreg  per cardiology.  Currently normal sinus rhythm   Hypertension; uncontrolled labile.  Uptitrate Coreg  as needed.   HypoMg as needed   GERD - Continue home PPI, Pepcid      Wrist fracture Left-sided wrist fracture likely from fall.  Splint placed in the ER.  Will need outpatient follow-up with orthopedic.  Pain medication prescribed along with bowel regimen   Chronic pain Pain control with bowel regimen    Anemia Hemoglobin stable around 11.0   Depression - Not currently on medication for this   Substance use Positive for benzodiazepine, opioids and cocaine.  Counseled to quit using this   History of seizure - Not currently on any antiepileptics     DVT prophylaxis: heparin  injection 5,000 Units Start: 11/10/23 2200    Code Status: Full Code Family Communication:   Discharge once cleared by cardiology and is tolerating p.o.   Subjective: Verbally abusive towards floor staff. Continues to ask for IV pain medications   Examination:  General exam: Appears calm and comfortable  Respiratory system: Clear to auscultation. Respiratory effort normal. Cardiovascular system: S1 & S2 heard, RRR. No JVD, murmurs, rubs, gallops or clicks. No pedal edema. Gastrointestinal system: Abdomen is nondistended, soft and nontender. No organomegaly or masses felt. Normal bowel sounds heard. Central nervous system: Alert and oriented. No  focal neurological deficits. Extremities: Symmetric 5 x 5 power. Skin: No rashes,  lesions or ulcers Psychiatry: Judgement and insight appear normal. Mood & affect appropriate.    Discharge Diagnoses:  Principal Problem:   Syncope and collapse Active Problems:   Paroxysmal atrial fibrillation (HCC)   Alcohol  abuse   Essential hypertension   Polysubstance abuse (tobacco, cocaine, THC, and ETOH)   Chronic pancreatitis (HCC)   Major depressive disorder, recurrent severe without psychotic features (HCC)   Normocytic anemia   GERD (gastroesophageal reflux disease)   Seizure (HCC)   WPW (Wolff-Parkinson-White syndrome)   NSVT (nonsustained ventricular tachycardia) (HCC)   Non-cardiac chest pain   Hypertensive urgency      Discharge Exam: Vitals:   11/12/23 2350 11/13/23 0824  BP: (!) 132/93 (!) 163/95  Pulse: 84 85  Resp: 18 18  Temp: 98.2 F (36.8 C) 98 F (36.7 C)  SpO2: 100% 92%   Vitals:   11/12/23 1925 11/12/23 2000 11/12/23 2350 11/13/23 0824  BP: (!) 157/100 (!) 152/94 (!) 132/93 (!) 163/95  Pulse: 82 88 84 85  Resp: 16  18 18   Temp: 98 F (36.7 C)  98.2 F (36.8 C) 98 F (36.7 C)  TempSrc: Oral  Oral Oral  SpO2: 100%  100% 92%  Weight:      Height:          Discharge Instructions   Allergies as of 11/13/2023       Reactions   Robaxin  [methocarbamol ] Other (See Comments)   jumpy limbs   Aspirin  Other (See Comments)   Unknown reaction   Shellfish-derived Products Nausea And Vomiting, Rash   Trazodone  And Nefazodone Other (See Comments)   Muscle spasms   Adhesive [tape] Itching   Fish-derived Products    Latex Itching   Toradol  [ketorolac  Tromethamine ] Other (See Comments)   Has ulcers; cannot have this   Contrast Media [iodinated Contrast Media] Hives   Reglan  [metoclopramide ] Other (See Comments)   Muscle spasms   Salmon [fish Oil] Nausea And Vomiting, Rash        Medication List     STOP taking these medications    metoprolol  200 MG 24 hr tablet Commonly known as: TOPROL -XL   ondansetron  4 MG tablet Commonly  known as: ZOFRAN    sodium zirconium cyclosilicate  10 g Pack packet Commonly known as: LOKELMA        TAKE these medications    acetaminophen  500 MG tablet Commonly known as: TYLENOL  Take 1,000 mg by mouth daily as needed for mild pain (pain score 1-3) or moderate pain (pain score 4-6).   calcium -vitamin D  500-5 MG-MCG tablet Commonly known as: OSCAL WITH D Take 1 tablet by mouth daily with breakfast.   carvedilol  25 MG tablet Commonly known as: COREG  Take 1 tablet (25 mg total) by mouth 2 (two) times daily with a meal.   CertaVite/Antioxidants Tabs Take 1 tablet by mouth daily.   Creon  36000-114000 units Cpep capsule Generic drug: lipase/protease/amylase Take 2 capsules (72,000 Units total) by mouth 3 (three) times daily with meals. May also take 1 capsule (36,000 Units total) as needed (with snacks).   dicyclomine  20 MG tablet Commonly known as: BENTYL  Take 1 tablet (20 mg total) by mouth daily as needed for spasms.   famotidine  40 MG tablet Commonly known as: PEPCID  Take 1 tablet (40 mg total) by mouth daily.   fluticasone  50 MCG/ACT nasal spray Commonly known as: FLONASE  Place 2 sprays into both nostrils daily as needed for allergies.  folic acid  1 MG tablet Commonly known as: FOLVITE  Take 1 tablet (1 mg total) by mouth daily.   lidocaine  2 % solution Commonly known as: XYLOCAINE  Use as directed 15 mLs in the mouth or throat every 12 (twelve) hours as needed.   loperamide  2 MG capsule Commonly known as: IMODIUM  Take 2 capsules (4 mg total) by mouth as needed for diarrhea or loose stools.   magnesium  oxide 400 (240 Mg) MG tablet Commonly known as: MAG-OX Take 1 tablet (400 mg total) by mouth daily.   nicotine  21 mg/24hr patch Commonly known as: NICODERM CQ  - dosed in mg/24 hours Place 1 patch (21 mg total) onto the skin daily. Start taking on: November 14, 2023   ondansetron  4 MG disintegrating tablet Commonly known as: ZOFRAN -ODT Take 1 tablet (4 mg  total) by mouth every 8 (eight) hours as needed for nausea or vomiting. What changed: when to take this   Oxycodone  HCl 10 MG Tabs Take 1 tablet (10 mg total) by mouth every 6 (six) hours as needed for moderate pain (pain score 4-6) or severe pain (pain score 7-10). What changed:  medication strength how much to take when to take this reasons to take this   pantoprazole  20 MG tablet Commonly known as: PROTONIX  Take 1 tablet (20 mg total) by mouth daily before breakfast. What changed: when to take this   polyethylene glycol powder 17 GM/SCOOP powder Commonly known as: GLYCOLAX /MIRALAX  Take 17 g by mouth daily as needed for mild constipation.   psyllium 95 % Pack Commonly known as: HYDROCIL/METAMUCIL Take 1 packet by mouth daily.   sucralfate  1 g tablet Commonly known as: Carafate  Take 1 tablet (1 g total) by mouth in the morning, at noon, and at bedtime.   tamsulosin  0.4 MG Caps capsule Commonly known as: FLOMAX  Take 1 capsule (0.4 mg total) by mouth daily.   thiamine  100 MG tablet Commonly known as: VITAMIN B1 Take 1 tablet (100 mg total) by mouth daily.        Follow-up Information     Management, [facility]. Schedule an appointment as soon as possible for a visit in 1 week(s).   Specialty: Pain Medicine Contact information: [address] [place] [address] [place] [address] [phone]         [facility] Schedule an appointment as soon as possible for a visit in 1 week(s).   Why: Fracture wrist Contact information: [address] [place] [address] [phone]                Allergies  Allergen Reactions   Robaxin  [Methocarbamol ] Other (See Comments)    jumpy limbs   Aspirin  Other (See Comments)    Unknown reaction   Shellfish-Derived Products Nausea And Vomiting and Rash   Trazodone  And Nefazodone Other (See Comments)    Muscle spasms   Adhesive [Tape] Itching   Fish-Derived Products    Latex Itching    Toradol  [Ketorolac  Tromethamine ] Other (See Comments)    Has ulcers; cannot have this   Contrast Media [Iodinated Contrast Media] Hives   Reglan  [Metoclopramide ] Other (See Comments)    Muscle spasms   Salmon [Fish Oil] Nausea And Vomiting and Rash    You were cared for by a hospitalist during your hospital stay. If you have any questions about your discharge medications or the care you received while you were in the hospital after you are discharged, you can call the unit and asked to speak  with the hospitalist on call if the hospitalist that took care of you is not available. Once you are discharged, your primary care physician will handle any further medical issues. Please note that no refills for any discharge medications will be authorized once you are discharged, as it is imperative that you return to your primary care physician (or establish a relationship with a primary care physician if you do not have one) for your aftercare needs so that they can reassess your need for medications and monitor your lab values.  You were cared for by a hospitalist during your hospital stay. If you have any questions about your discharge medications or the care you received while you were in the hospital after you are discharged, you can call the unit and asked to speak with the hospitalist on call if the hospitalist that took care of you is not available. Once you are discharged, your primary care physician will handle any further medical issues. Please note that NO REFILLS for any discharge medications will be authorized once you are discharged, as it is imperative that you return to your primary care physician (or establish a relationship with a primary care physician if you do not have one) for your aftercare needs so that they can reassess your need for medications and monitor your lab values.  Please request your Prim.MD to go over all Hospital Tests and Procedure/Radiological results at the follow up,  please get all Hospital records sent to your Prim MD by signing hospital release before you go home.  Get CBC, CMP, 2 view Chest X ray checked  by Primary MD during your next visit or SNF MD in 5-7 days ( we routinely change or add medications that can affect your baseline labs and fluid status, therefore we recommend that you get the mentioned basic workup next visit with your PCP, your PCP may decide not to get them or add new tests based on their clinical decision)  On your next visit with your primary care physician please Get Medicines reviewed and adjusted.  If you experience worsening of your admission symptoms, develop shortness of breath, life threatening emergency, suicidal or homicidal thoughts you must seek medical attention immediately by calling 911 or calling your MD immediately  if symptoms less severe.  You Must read complete instructions/literature along with all the possible adverse reactions/side effects for all the Medicines you take and that have been prescribed to you. Take any new Medicines after you have completely understood and accpet all the possible adverse reactions/side effects.   Do not drive, operate heavy machinery, perform activities at heights, swimming or participation in water  activities or provide baby sitting services if your were admitted for syncope or siezures until you have seen by Primary MD or a Neurologist and advised to do so again.  Do not drive when taking Pain medications.   Procedures/Studies: ECHOCARDIOGRAM COMPLETE Result Date: 11/10/2023    ECHOCARDIOGRAM REPORT   Patient Name:   [facility] Date of Exam: 11/10/2023 Medical Rec #:  [id number]              Height:       68.0 in Accession #:    [id number]             Weight:       122.7 lb Date of Birth:  1971-04-07               BSA:          1.661  m Patient Age:    53 years               BP:           203/112 mmHg Patient Gender: M                      HR:           76 bpm. Exam Location:   Inpatient Procedure: 2D Echo (Both Spectral and Color Flow Doppler were utilized during            procedure). Indications:    syncope  History:        Patient has prior history of Echocardiogram examinations, most                 recent 02/16/2022. Arrythmias:WPW; Risk Factors:Hypertension,                 Current Smoker and polysubstance abuse.  Sonographer:    Tinnie Barefoot RDCS Referring Phys: [id number] MARSA NOVAK MELVIN IMPRESSIONS  1. Left ventricular ejection fraction, by estimation, is 70 to 75%. The left ventricle has hyperdynamic function. The left ventricle has no regional wall motion abnormalities. Left ventricular diastolic parameters are consistent with Grade I diastolic dysfunction (impaired relaxation).  2. Right ventricular systolic function is normal. The right ventricular size is normal. There is mildly elevated pulmonary artery systolic pressure. The estimated right ventricular systolic pressure is 44.0 mmHg.  3. The mitral valve is normal in structure. No evidence of mitral valve regurgitation. No evidence of mitral stenosis.  4. The aortic valve has an indeterminant number of cusps. Aortic valve regurgitation is not visualized. No aortic stenosis is present.  5. The inferior vena cava is normal in size with <50% respiratory variability, suggesting right atrial pressure of 8 mmHg. Comparison(s): No significant change from prior study. FINDINGS  Left Ventricle: Left ventricular ejection fraction, by estimation, is 70 to 75%. The left ventricle has hyperdynamic function. The left ventricle has no regional wall motion abnormalities. Strain was performed and the global longitudinal strain is indeterminate. The left ventricular internal cavity size was normal in size. There is no left ventricular hypertrophy. Left ventricular diastolic parameters are consistent with Grade I diastolic dysfunction (impaired relaxation). Right Ventricle: The right ventricular size is normal. No increase in right  ventricular wall thickness. Right ventricular systolic function is normal. There is mildly elevated pulmonary artery systolic pressure. The tricuspid regurgitant velocity is 3.00  m/s, and with an assumed right atrial pressure of 8 mmHg, the estimated right ventricular systolic pressure is 44.0 mmHg. Left Atrium: Left atrial size was normal in size. Right Atrium: Right atrial size was normal in size. Pericardium: There is no evidence of pericardial effusion. Mitral Valve: The mitral valve is normal in structure. No evidence of mitral valve regurgitation. No evidence of mitral valve stenosis. Tricuspid Valve: The tricuspid valve is normal in structure. Tricuspid valve regurgitation is trivial. No evidence of tricuspid stenosis. Aortic Valve: The aortic valve has an indeterminant number of cusps. Aortic valve regurgitation is not visualized. No aortic stenosis is present. Pulmonic Valve: The pulmonic valve was not well visualized. Pulmonic valve regurgitation is not visualized. No evidence of pulmonic stenosis. Aorta: The aortic root and ascending aorta are structurally normal, with no evidence of dilitation. Venous: The inferior vena cava is normal in size with less than 50% respiratory variability, suggesting right atrial pressure of 8 mmHg. IAS/Shunts: No atrial level shunt detected by color flow  Doppler. Additional Comments: 3D was performed not requiring image post processing on an independent workstation and was indeterminate.  LEFT VENTRICLE PLAX 2D LVIDd:         4.40 cm   Diastology LVIDs:         2.60 cm   LV e' medial:    11.00 cm/s LV PW:         0.80 cm   LV E/e' medial:  11.1 LV IVS:        0.60 cm   LV e' lateral:   12.60 cm/s LVOT diam:     1.90 cm   LV E/e' lateral: 9.7 LV SV:         58 LV SV Index:   35 LVOT Area:     2.84 cm  RIGHT VENTRICLE             IVC RV Basal diam:  2.50 cm     IVC diam: 2.00 cm RV S prime:     14.80 cm/s TAPSE (M-mode): 1.8 cm LEFT ATRIUM             Index        RIGHT  ATRIUM          Index LA diam:        3.00 cm 1.81 cm/m   RA Area:     9.63 cm LA Vol (A2C):   37.3 ml 22.46 ml/m  RA Volume:   19.90 ml 11.98 ml/m LA Vol (A4C):   31.1 ml 18.73 ml/m LA Biplane Vol: 34.4 ml 20.72 ml/m  AORTIC VALVE LVOT Vmax:   117.00 cm/s LVOT Vmean:  77.300 cm/s LVOT VTI:    0.206 m  AORTA Ao Root diam: 2.70 cm Ao Asc diam:  2.60 cm MITRAL VALVE                TRICUSPID VALVE MV Area (PHT): 5.02 cm     TR Peak grad:   36.0 mmHg MV Decel Time: 151 msec     TR Vmax:        300.00 cm/s MV E velocity: 122.00 cm/s MV A velocity: 96.30 cm/s   SHUNTS MV E/A ratio:  1.27         Systemic VTI:  0.21 m                             Systemic Diam: 1.90 cm Vishnu Priya Mallipeddi Electronically signed by Diannah Late Mallipeddi Signature Date/Time: 11/10/2023/4:55:23 PM    Final    CT Head Wo Contrast Result Date: 11/10/2023 CLINICAL DATA:  Ataxia.  Head trauma. EXAM: CT HEAD WITHOUT CONTRAST TECHNIQUE: Contiguous axial images were obtained from the base of the skull through the vertex without intravenous contrast. RADIATION DOSE REDUCTION: This exam was performed according to the departmental dose-optimization program which includes automated exposure control, adjustment of the mA and/or kV according to patient size and/or use of iterative reconstruction technique. COMPARISON:  CT 10/19/2023 FINDINGS: Brain: No acute intracranial hemorrhage. No focal mass lesion. No CT evidence of acute infarction. No midline shift or mass effect. No hydrocephalus. Basilar cisterns are patent. Vascular: No hyperdense vessel or unexpected calcification. Skull: Normal. Negative for fracture or focal lesion. Sinuses/Orbits: Paranasal sinuses and mastoid air cells are clear. Orbits are clear. Postsurgical hardware in the medial LEFT orbit. Other: None. IMPRESSION: 1. No acute intracranial findings. 2. Postsurgical changes in the LEFT orbit. Electronically Signed   By:  Jackquline Boxer M.D.   On: 11/10/2023 11:57   DG  Wrist Complete Left Result Date: 11/10/2023 CLINICAL DATA:  Unwitnessed fall.  Left wrist pain and deformity. EXAM: LEFT WRIST - COMPLETE 3+ VIEW COMPARISON:  None Available. FINDINGS: Three views are submitted. The mineralization is adequate. There is a nondisplaced ulnar styloid fracture. Probable nondisplaced fracture of the distal radius with mild dorsal cortical irregularity. No evidence of dislocation or carpal bone fracture. IV tubing in place. Moderate soft tissue swelling around the wrist without evidence of unexpected foreign body or soft tissue emphysema. IMPRESSION: Nondisplaced ulnar styloid fracture. Probable nondisplaced distal radius fracture. No dislocation. Electronically Signed   By: Elsie Perone M.D.   On: 11/10/2023 11:43   US  Abdomen Limited RUQ (LIVER/GB) Result Date: 11/07/2023 CLINICAL DATA:  Right upper quadrant pain EXAM: ULTRASOUND ABDOMEN LIMITED RIGHT UPPER QUADRANT COMPARISON:  None Available. FINDINGS: Gallbladder: No gallstones or wall thickening visualized. No sonographic Murphy sign noted by sonographer. Common bile duct: Diameter: 5 mm Liver: No focal lesion identified. Within normal limits in parenchymal echogenicity. Portal vein is patent on color Doppler imaging with normal direction of blood flow towards the liver. Other: None. IMPRESSION: No gallstones or ductal dilatation. Electronically Signed   By: Ranell Bring M.D.   On: 11/07/2023 10:12   CT ABDOMEN PELVIS WO CONTRAST Result Date: 11/07/2023 CLINICAL DATA:  Left lower quadrant abdominal pain, chronic pancreatitis, diarrhea EXAM: CT ABDOMEN AND PELVIS WITHOUT CONTRAST TECHNIQUE: Multidetector CT imaging of the abdomen and pelvis was performed following the standard protocol without IV contrast. RADIATION DOSE REDUCTION: This exam was performed according to the departmental dose-optimization program which includes automated exposure control, adjustment of the mA and/or kV according to patient size and/or use of  iterative reconstruction technique. COMPARISON:  CT abdomen pelvis 10/22/2023 FINDINGS: Lower chest: No acute abnormality. Hepatobiliary: No focal liver abnormality is seen. No gallstones, gallbladder wall thickening, or biliary dilatation. Pancreas: Coarse calcifications in the pancreatic head and body compatible with sequela of chronic pancreatitis. 4.0 cm and 1.2 cm cysts in the pancreatic talar similar to prior and compatible with pseudocysts. No evidence of acute pancreatic inflammation. No ductal dilation. Spleen: Unremarkable. Adrenals/Urinary Tract: Unremarkable adrenal glands. No urinary calculi or hydronephrosis. Unremarkable bladder. Stomach/Bowel: Normal caliber large and small bowel. No bowel wall thickening. Stomach is unremarkable. Vascular/Lymphatic: Aortic atherosclerosis. No enlarged abdominal or pelvic lymph nodes. Unremarkable. Reproductive: Air. Other: No free intraperitoneal fluid or Musculoskeletal: No acute fracture. IMPRESSION: 1. No acute abnormality in the abdomen or pelvis. 2. 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 No evidence of acute pancreatic inflammation. 3. Aortic Atherosclerosis (ICD10-I70.0). Electronically Signed   By: Norman Gatlin M.D.   On: 11/07/2023 01:54   DG Chest 2 View Result Date: 11/06/2023 CLINICAL DATA:  Chest pain EXAM: CHEST - 2 VIEW COMPARISON:  10/21/2023 FINDINGS: The heart size and mediastinal contours are within normal limits. Both lungs are clear. The visualized skeletal structures are unremarkable. IMPRESSION: No active cardiopulmonary disease. Electronically Signed   By: Oneil Devonshire M.D.   On: 11/06/2023 23:39   CT ABDOMEN PELVIS WO CONTRAST Result Date: 10/22/2023 CLINICAL DATA:  Abdominal pain. EXAM: CT ABDOMEN AND PELVIS WITHOUT CONTRAST TECHNIQUE: Multidetector CT imaging of the abdomen and pelvis was performed following the standard protocol without IV contrast. RADIATION DOSE REDUCTION: This exam was  performed according to the departmental dose-optimization program which includes automated exposure control, adjustment of the mA and/or kV according to patient size and/or  use of iterative reconstruction technique. COMPARISON:  October 17, 2023 FINDINGS: Lower chest: No acute abnormality. Hepatobiliary: No focal liver abnormality is seen. No gallstones, gallbladder wall thickening, or biliary dilatation. Pancreas: A 3.9 cm diameter cyst is seen along the junction of the pancreatic body and tail. Innumerable predominant subcentimeter parenchymal calcifications are also seen scattered throughout the pancreatic body and head. There is no peripancreatic inflammatory fat stranding or pancreatic ductal dilatation. Spleen: Normal in size without focal abnormality. Adrenals/Urinary Tract: Adrenal glands are unremarkable. Kidneys are normal, without renal calculi, focal lesion, or hydronephrosis. Bladder is unremarkable. Stomach/Bowel: Stomach is within normal limits. Appendix appears normal. Stool is seen throughout the large bowel. Predominantly fluid-filled small bowel loops are seen throughout the abdomen and pelvis. The small bowel loops are similar in appearance when compared to the prior study. No evidence of bowel wall thickening, distention, or inflammatory changes. Vascular/Lymphatic: Aortic atherosclerosis. No enlarged abdominal or pelvic lymph nodes. Reproductive: Prostate is unremarkable. Other: No abdominal wall hernia or abnormality. No abdominopelvic ascites. Musculoskeletal: No acute or significant osseous findings. IMPRESSION: 1. Findings consistent with chronic pancreatitis. 2. 3.9 cm diameter cyst along the junction of the pancreatic body and tail, likely representing a pseudocyst. 3. Aortic atherosclerosis. Electronically Signed   By: Suzen Dials M.D.   On: 10/22/2023 01:46   DG Chest 2 View Result Date: 10/21/2023 CLINICAL DATA:  Chest pains. EXAM: CHEST - 2 VIEW COMPARISON:  Recent portable  chest 10/19/2023. FINDINGS: The lungs are hyperinflated. There is increased central bronchial thickening consistent with bronchitis. There are overlying rounded artifacts on the left. Possibly lead placement stickers. The lungs are clear of infiltrates.  No pleural effusion is seen. The cardiomediastinal silhouette and vasculature are normal. The bones are mildly demineralized. IMPRESSION: Hyperinflation with increased central bronchial thickening consistent with bronchitis. No evidence of acute chest disease. Electronically Signed   By: Francis Quam M.D.   On: 10/21/2023 20:55   CT Head Wo Contrast Result Date: 10/19/2023 CLINICAL DATA:  Provided history: Head trauma, moderate/severe. EXAM: CT HEAD WITHOUT CONTRAST TECHNIQUE: Contiguous axial images were obtained from the base of the skull through the vertex without intravenous contrast. RADIATION DOSE REDUCTION: This exam was performed according to the departmental dose-optimization program which includes automated exposure control, adjustment of the mA and/or kV according to patient size and/or use of iterative reconstruction technique. COMPARISON:  Head CT 07/01/2023. FINDINGS: Brain: No age-advanced or lobar predominant cerebral atrophy. There is no acute intracranial hemorrhage. No demarcated cortical infarct. No extra-axial fluid collection. No evidence of an intracranial mass. No midline shift. Vascular: No hyperdense vessel. Skull: No acute calvarial fracture. Chronic deformity of the right parietal calvarium. Sinuses/Orbits: Chronic medially displaced fracture deformity of the left lamina papyracea. Prior surgical fixation of the left inferior orbital rim. No mass or acute finding within the imaged orbits. No paranasal sinus disease at the imaged levels. IMPRESSION: No evidence of an acute intracranial abnormality. Electronically Signed   By: Rockey Childs D.O.   On: 10/19/2023 08:27   DG Chest 1 View Result Date: 10/19/2023 CLINICAL DATA:  Chest  pain. EXAM: CHEST  1 VIEW COMPARISON:  10/17/2023 FINDINGS: Lungs are hyperexpanded. The lungs are clear without focal pneumonia, edema, pneumothorax or pleural effusion. The cardiopericardial silhouette is within normal limits for size. Old posterior right rib fracture evident. IMPRESSION: Hyperexpansion without acute cardiopulmonary findings. Electronically Signed   By: Camellia Candle M.D.   On: 10/19/2023 07:16   CT ABDOMEN PELVIS WO CONTRAST Result Date:  10/17/2023 CLINICAL DATA:  Abdominal pain left lower quadrant pain epigastric pain EXAM: CT ABDOMEN AND PELVIS WITHOUT CONTRAST TECHNIQUE: Multidetector CT imaging of the abdomen and pelvis was performed following the standard protocol without IV contrast. RADIATION DOSE REDUCTION: This exam was performed according to the departmental dose-optimization program which includes automated exposure control, adjustment of the mA and/or kV according to patient size and/or use of iterative reconstruction technique. COMPARISON:  CT abdomen August 13, 2023 FINDINGS: Lower chest: No infiltrates or consolidations, no pleural effusions Hepatobiliary: Liver normal size no masses no biliary dilatation. Gallbladder unremarkable. No gallstones. Pancreas: Extensive pancreatic calcification correlate with chronic pancreatitis. Comparison with prior examinations demonstrates no change in the cystic structure within the body tail pancreas region that measures 3.9 x 3.2 cm and correlates with pancreatic pseudocyst. No inflammatory changes to indicate acute associated pancreatitis. Spleen: Spleen normal size.  No masses. Adrenals/Urinary Tract: Adrenal glands are normal size. Follow-up recommended. Kidneys are normal. No masses calcifications or hydronephrosis Stomach/Bowel: No small or large bowel obstruction or inflammatory changes. Moderate amount of residual fecal material throughout the colon without obstruction or constipation. Vascular/Lymphatic: No significant vascular  findings are present. No enlarged abdominal or pelvic lymph nodes. Reproductive: Prostate is unremarkable. Other: Anterior abdominal wall unremarkable without evidence of umbilical or inguinal hernias Musculoskeletal: Visualized portion of the thoracolumbar spine and pelvic structures grossly unremarkable without evidence of fracture bony abnormalities or soft tissue masses. IMPRESSION: *No acute findings in the abdomen or pelvis. *Chronic pancreatitis with stable pancreatic pseudocyst. *Moderate amount of residual fecal material throughout the colon without obstruction or constipation. Electronically Signed   By: Franky Chard M.D.   On: 10/17/2023 16:58   DG Chest 2 View Result Date: 10/17/2023 CLINICAL DATA:  Chest pain. EXAM: CHEST - 2 VIEW COMPARISON:  October 13, 2023. FINDINGS: The heart size and mediastinal contours are within normal limits. Both lungs are clear. The visualized skeletal structures are unremarkable. IMPRESSION: No active cardiopulmonary disease. Electronically Signed   By: Lynwood Landy Raddle M.D.   On: 10/17/2023 16:38     The results of significant diagnostics from this hospitalization (including imaging, microbiology, ancillary and laboratory) are listed below for reference.     Microbiology: No results found for this or any previous visit (from the past 240 hours).   Labs: BNP (last 3 results) Recent Labs    08/13/23 1001  BNP 423.9*   Basic Metabolic Panel: Recent Labs  Lab 11/06/23 2341 11/10/23 1115 11/10/23 1319 11/11/23 0236 11/12/23 0500 11/12/23 0819 11/13/23 0238  NA 136 137  --  136 137  --  131*  K 3.6 4.4  --  4.2 4.1  --  3.6  CL 107 107  --  103 106  --  93*  CO2 18* 18*  --  20* 20*  --  26  GLUCOSE 96 83  --  74 123*  --  107*  BUN 7 12  --  10 10  --  13  CREATININE 0.73 0.96  --  0.90 0.91  --  0.61  CALCIUM  8.1* 8.6*  --  9.0 9.0  --  7.4*  MG  --   --  1.2* 1.1* 1.4*  --  2.0  PHOS  --   --   --   --   --  3.9  --    Liver Function  Tests: Recent Labs  Lab 11/06/23 2341 11/10/23 1115 11/11/23 0236  AST 58* 54* 35  ALT 36 25 27  ALKPHOS 66 68 71  BILITOT 0.7 0.9 1.4*  PROT 6.6 6.9 7.5  ALBUMIN  3.7 3.5 3.9   Recent Labs  Lab 11/06/23 2341 11/10/23 1115  LIPASE 27 22   No results for input(s): AMMONIA in the last 168 hours. CBC: Recent Labs  Lab 11/06/23 2341 11/10/23 1115 11/11/23 0236  WBC 6.5 9.2 8.9  NEUTROABS  --  6.1  --   HGB 9.9* 10.7* 11.7*  HCT 30.4* 32.4* 35.6*  MCV 90.7 93.9 95.4  PLT 203 172 155   Cardiac Enzymes: No results for input(s): CKTOTAL, CKMB, CKMBINDEX, TROPONINI in the last 168 hours. BNP: Invalid input(s): POCBNP CBG: Recent Labs  Lab 11/11/23 0535 11/11/23 0702 11/12/23 0505 11/13/23 0851  GLUCAP 61* 129* 141* 115*   D-Dimer No results for input(s): DDIMER in the last 72 hours. Hgb A1c No results for input(s): HGBA1C in the last 72 hours. Lipid Profile No results for input(s): CHOL, HDL, LDLCALC, TRIG, CHOLHDL, LDLDIRECT in the last 72 hours. Thyroid  function studies No results for input(s): TSH, T4TOTAL, T3FREE, THYROIDAB in the last 72 hours.  Invalid input(s): FREET3 Anemia work up No results for input(s): VITAMINB12, FOLATE, FERRITIN, TIBC, IRON, RETICCTPCT in the last 72 hours. Urinalysis    Component Value Date/Time   COLORURINE COLORLESS (A) 11/06/2023 2346   APPEARANCEUR CLEAR 11/06/2023 2346   LABSPEC 1.002 (L) 11/06/2023 2346   PHURINE 6.0 11/06/2023 2346   GLUCOSEU NEGATIVE 11/06/2023 2346   HGBUR NEGATIVE 11/06/2023 2346   HGBUR negative 04/30/2010 1020   BILIRUBINUR NEGATIVE 11/06/2023 2346   BILIRUBINUR negative 09/06/2019 1649   KETONESUR NEGATIVE 11/06/2023 2346   PROTEINUR NEGATIVE 11/06/2023 2346   UROBILINOGEN 0.2 09/06/2019 1649   UROBILINOGEN 0.2 03/15/2015 0508   NITRITE NEGATIVE 11/06/2023 2346   LEUKOCYTESUR NEGATIVE 11/06/2023 2346   Sepsis Labs Recent Labs  Lab  11/06/23 2341 11/10/23 1115 11/11/23 0236  WBC 6.5 9.2 8.9   Microbiology No results found for this or any previous visit (from the past 240 hours).   Time coordinating discharge:  I have spent 35 minutes face to face with the patient and on the ward discussing the patients care, assessment, plan and disposition with other care givers. >50% of the time was devoted counseling the patient about the risks and benefits of treatment/Discharge disposition and coordinating care.   SIGNED:   Burgess JAYSON Dare, MD  [facility] 11/13/2023, 10:44 AM   If 7PM-7AM, please contact night-coverage

## 2023-11-13 NOTE — TOC Initial Note (Signed)
 Transition of Care [facility]) - Initial/Assessment Note    Patient Details  Name: [name] MRN: [id number] Date of Birth: 10-25-1970  Transition [facility] CM/SW Contact:    Isaiah Public, LCSWA Phone Number: 11/13/2023, 10:42 AM  Clinical Narrative:                  CSW spoke with patient  at beside. Patient reports he comes from home with his mother. CSW offered patient outpatient substance use treatment services resources as well as transportation resources. Patient accepted. All questions answered. Patient will need transportation at dc. CSW informed patient that discharge lounge will provide cab voucher. Patient thanked CSW.PCP arranged and is on AVS. No further questions reported at this time.        Patient Goals and CMS Choice            Expected Discharge Plan and Services         Expected Discharge Date: 11/13/23                                    Prior Living Arrangements/Services                       Activities of Daily Living   ADL Screening (condition at time of admission) Independently performs ADLs?: Yes (appropriate for developmental age) Is the patient deaf or have difficulty hearing?: No Does the patient have difficulty seeing, even when wearing glasses/contacts?: No Does the patient have difficulty concentrating, remembering, or making decisions?: Yes (Problems with concentration)  Permission Sought/Granted                  Emotional Assessment              Admission diagnosis:  Syncope and collapse [R55] Epigastric pain [R10.13] Syncope, unspecified syncope type [R55] Patient Active Problem List   Diagnosis Date Noted   Hypertensive urgency 11/12/2023   Syncope and collapse 11/10/2023   Non-cardiac chest pain 11/10/2023   Intractable diarrhea 08/10/2023   Intractable nausea and vomiting 06/18/2023   History of peptic ulcer 03/07/2023   Lumbosacral radiculopathy 03/07/2023   Duodenitis 03/06/2023    Gastric AVM 03/06/2023   AVM (arteriovenous malformation) of small bowel, 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   Hyperkalemia 03/03/2023   Acute urinary retention 03/01/2023   Intractable pain 02/09/2023   Metabolic acidosis, increased anion gap 02/08/2023   Cocaine abuse (HCC) 02/08/2023   Adynamic ileus (HCC) 01/25/2023   Cannabinoid hyperemesis syndrome 01/25/2023   Epigastric abdominal pain 01/09/2023   Asthma, chronic 01/09/2023   Ileus (HCC) 01/09/2023   Hypokalemia 09/18/2022   NSVT (nonsustained ventricular tachycardia) (HCC) 01/20/2022   Normal anion gap metabolic acidosis 12/08/2021   Hypoalbuminemia 12/08/2021   WPW (Wolff-Parkinson-White syndrome) 12/06/2021   Paroxysmal atrial fibrillation (HCC) 12/06/2021   Pulmonary nodule 12/06/2021   Leukocytosis 06/07/2021   Epigastric pain 06/07/2021   Urinary retention    Protein-calorie malnutrition, severe 11/17/2020   Hypomagnesemia    Hypocalcemia 10/23/2020   Seizure (HCC) 11/13/2018   Acute on chronic pancreatitis (HCC) 09/28/2017   Tachycardia 03/18/2017   Nausea, vomiting, and diarrhea 03/18/2017   Diarrhea 03/18/2017   GERD (gastroesophageal reflux disease)    Alcohol  use disorder 12/05/2016   Normocytic anemia 12/05/2016   Alcohol  use disorder, severe, dependence (HCC) 07/25/2016   Major depressive disorder, 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   Chronic pancreatitis (  HCC) 05/18/2015   Polysubstance abuse (tobacco, cocaine, THC, and ETOH) 03/26/2015   Essential hypertension 02/06/2014   Mood disorder (HCC) 02/06/2014   Pancreatic pseudocyst/cyst 11/25/2013   Kassie Pour White pattern seen on electrocardiogram 10/03/2012   Alcohol  abuse 03/23/2007   Tobacco abuse 03/23/2007   PCP:  Celestia Rosaline SQUIBB, NP Pharmacy:   [facility] [phone] [place], [place] - [address] AT [facility] OF [address] [place] [address] Phone: [phone] Fax:  [phone]  [name] [address] [place] [address] Phone: [phone] Fax: [phone]  [id number] [place], [place] - [address] AT [facility] [address] [place] [address] Phone: [phone] Fax: [phone]  [place] [facility] [address] [place] [address] Phone: [phone] Fax: [phone]  [id number] - [place], [place] - [address] AT [facility] [address] [place] [address] Phone: [phone] Fax: [phone]     Social Drivers of Health (SDOH) Social History: SDOH Screenings   Food Insecurity: No Food Insecurity (11/10/2023)  Housing: High Risk (11/10/2023)  Transportation Needs: Unmet Transportation Needs (11/10/2023)  Utilities: Not At Risk (11/10/2023)  Alcohol  Screen: Medium Risk (03/14/2017)  Depression (PHQ2-9): Medium Risk (03/27/2020)  Social Connections: Socially Isolated (11/10/2023)  Tobacco Use: High Risk (11/10/2023)   SDOH Interventions:     Readmission Risk Interventions    06/19/2023    4:11 PM 03/07/2023    4:21 PM 01/10/2023    4:32 PM  Readmission Risk Prevention Plan  Transportation Screening Complete Complete Complete  Medication Review (RN Care Manager) Referral to Pharmacy Complete Complete  PCP or Specialist appointment within 3-5 days of discharge Complete Complete Complete  HRI or Home Care Consult Complete Complete Complete  SW Recovery Care/Counseling Consult Complete Complete Complete  Palliative Care Screening Not Applicable Not Applicable Not Applicable  Skilled Nursing Facility Not Applicable Not Applicable Not Applicable

## 2023-11-13 NOTE — Progress Notes (Signed)
 Discharge instructions (including medications) discussed with and copy provided to patient. Patient verbalized understanding.  Patient belongings (medications and chewing tobacco) returned to patient.  Patient to take a taxi from the [employer].  Patient also has all TOC medications.

## 2023-11-13 NOTE — Progress Notes (Signed)
 Was approached by pt in hallway asking for pain medication, he was told to return to his room and I would bring him his medication. As I was entering the medication room to get his meds I met him in the hallway again and asked him to return to his room, he stated  you act like you don't want to give me my medications. I reassured pt that I was actively getting meds. When I entered his room, I observed him pressing buttons on the monitor. I asked him to not do that, that it was for staff to use only and patient became angry. He began verbally attacking me, and cursing at me. I felt threatened as I was in the room alone with him at the main end of the hallway. I left the room and reported the behavior to the charge nurse. During this time, pt approaches me and the charge nurse. I left the area to care for other patients. When I came out of another patients room, the patient was waiting for me at my desk and started hollering you fucking bitch, and continued to harass and verbally abuse me. I went to the nursing station to seek support, and the clerk called security.

## 2023-11-13 NOTE — Plan of Care (Signed)
   Problem: Education: Goal: Knowledge of condition and prescribed therapy will improve Outcome: Progressing   Problem: Cardiac: Goal: Will achieve and/or maintain adequate cardiac output Outcome: Progressing   Problem: Physical Regulation: Goal: Complications related to the disease process, condition or treatment will be avoided or minimized Outcome: Progressing   Problem: Education: Goal: Knowledge of General Education information will improve Description: Including pain rating scale, medication(s)/side effects and non-pharmacologic comfort measures Outcome: Progressing

## 2023-11-13 NOTE — Progress Notes (Signed)
   2 week live Zio recommended by Dr Loni for syncope, will place monitor today before discharge, plan reviewed with hospitlaist via secure chat. Primary cardiologist Dr Lavona to follow. Follow up with cardiology arranged 12/08/23. Cardiology will sign off today, please call for any question.

## 2023-11-13 NOTE — Progress Notes (Signed)
 2 week live Zio recommended by Dr Loni, will place today before discharge. Primary cardiologist Dr Lavona to follow. Follow up with cardiology arranged 12/08/23.

## 2023-11-14 ENCOUNTER — Ambulatory Visit: Payer: Self-pay

## 2023-11-14 ENCOUNTER — Telehealth: Payer: Self-pay

## 2023-11-14 NOTE — Telephone Encounter (Signed)
 FYI Only or Action Required?: FYI only for provider.  Patient was last seen in primary care on 08/31/2023 by Jason Rosaline SQUIBB, NP.  Called Nurse Triage reporting Chest Pain.  Symptoms began 11/10/2023 & today.  Interventions attempted: Other: ED on 11/10/2023.  Symptoms are: gradually worsening.  Triage Disposition: Go to ED Now (Notify PCP)  Patient/caregiver understands and will follow disposition?: Yes  Copied from CRM [id number]. Topic: Clinical - Red Word Triage >> Nov 14, 2023 12:13 PM [name] wrote: Red Word that prompted transfer to Nurse Triage: Patient is calling about just getting out of hospital and he is still having pain in his chest, he has med that he pushes and he has pushed it 3x's today. He gets dizzy then pressure. Reason for Disposition  SEVERE chest pain  Answer Assessment - Initial Assessment Questions 1. LOCATION: Where does it hurt?       Chest from center chest to left  2. RADIATION: Does the pain go anywhere else? (e.g., into neck, jaw, arms, back)     na 3. ONSET: When did the chest pain begin? (Minutes, hours or days)      today 4. PATTERN: Does the pain come and go, or has it been constant since it started?  Does it get worse with exertion?      Comes and goes 5. DURATION: How long does it last (e.g., seconds, minutes, hours)     na 6. SEVERITY: How bad is the pain?  (e.g., Scale 1-10; mild, moderate, or severe)    - MILD (1-3): doesn't interfere with normal activities     - MODERATE (4-7): interferes with normal activities or awakens from sleep    - SEVERE (8-10): excruciating pain, unable to do any normal activities       7/10 7. CARDIAC RISK FACTORS: Do you have any history of heart problems or risk factors for heart disease? (e.g., angina, prior heart attack; diabetes, high blood pressure, high cholesterol, smoker, or strong family history of heart disease)     na 8. PULMONARY RISK FACTORS: Do you have any history of lung  disease?  (e.g., blood clots in lung, asthma, emphysema, birth control pills)     na 9. CAUSE: What do you think is causing the chest pain?     unknown 10. OTHER SYMPTOMS: Do you have any other symptoms? (e.g., dizziness, nausea, vomiting, sweating, fever, difficulty breathing, cough)       Nausea, dizziness, cough, SOB w/exertion 11. PREGNANCY: Is there any chance you are pregnant? When was your last menstrual period?       na  Protocols used: Chest Pain-A-AH

## 2023-11-14 NOTE — Transitions of Care (Post Inpatient/ED Visit) (Signed)
   11/14/2023  Name: [name] MRN: [id number] DOB: 1970-11-01  Today's TOC FU Call Status: Today's TOC FU Call Status:: Unsuccessful Call (1st Attempt) Unsuccessful Call (1st Attempt) Date: 11/14/23  Attempted to reach the patient regarding the most recent Inpatient/ED visit.  Follow Up Plan: Additional outreach attempts will be made to reach the patient to complete the Transitions of Care (Post Inpatient/ED visit) call.   I called the patient and asked for [name]. The person who answered  said yes,and then I asked him to confirm his last name and birthday.  He told me that I already know that, why did I need to ask. I said I want to make sure I am speaking to the right person and he hung up   Signature Slater Diesel, RN

## 2023-11-15 ENCOUNTER — Telehealth: Payer: Self-pay

## 2023-11-15 NOTE — Transitions of Care (Post Inpatient/ED Visit) (Signed)
   11/15/2023  Name: [name] MRN: [id number] DOB: Feb 15, 1971  Today's TOC FU Call Status: Today's TOC FU Call Status:: Unsuccessful Call (2nd Attempt) Unsuccessful Call (1st Attempt) Date: 11/14/23 Unsuccessful Call (2nd Attempt) Date: 11/15/23  Attempted to reach the patient regarding the most recent Inpatient/ED visit.  Follow Up Plan: Additional outreach attempts will be made to reach the patient to complete the Transitions of Care (Post Inpatient/ED visit) call.   Signature   Slater Diesel, RN

## 2023-11-16 ENCOUNTER — Telehealth: Payer: Self-pay

## 2023-11-16 NOTE — Transitions of Care (Post Inpatient/ED Visit) (Signed)
   11/16/2023  Name: [name] MRN: [id number] DOB: 1970/12/11  Today's TOC FU Call Status: Today's TOC FU Call Status:: Unsuccessful Call (3rd Attempt) Unsuccessful Call (1st Attempt) Date: 11/14/23 Unsuccessful Call (2nd Attempt) Date: 11/15/23 Unsuccessful Call (3rd Attempt) Date: 11/16/23  Attempted to reach the patient regarding the most recent Inpatient/ED visit.  Follow Up Plan: No further outreach attempts will be made at this time. We have been unable to contact the patient.  The patient has an appointment with Rosaline Loving, NP at [facility] on 11/23/2023.  Signature  Slater Diesel, RN

## 2023-11-17 ENCOUNTER — Encounter (HOSPITAL_COMMUNITY): Payer: Self-pay | Admitting: Emergency Medicine

## 2023-11-17 ENCOUNTER — Emergency Department (HOSPITAL_COMMUNITY): Payer: MEDICAID

## 2023-11-17 ENCOUNTER — Other Ambulatory Visit (HOSPITAL_COMMUNITY): Payer: Self-pay

## 2023-11-17 ENCOUNTER — Other Ambulatory Visit: Payer: Self-pay

## 2023-11-17 ENCOUNTER — Emergency Department (HOSPITAL_COMMUNITY)
Admission: EM | Admit: 2023-11-17 | Discharge: 2023-11-17 | Disposition: A | Discharge status: Home / Self Care | Payer: MEDICAID | Attending: Emergency Medicine | Admitting: Emergency Medicine

## 2023-11-17 DIAGNOSIS — R197 Diarrhea, unspecified: Secondary | ICD-10-CM | POA: Diagnosis not present

## 2023-11-17 DIAGNOSIS — R079 Chest pain, unspecified: Secondary | ICD-10-CM | POA: Diagnosis present

## 2023-11-17 DIAGNOSIS — R072 Precordial pain: Secondary | ICD-10-CM | POA: Diagnosis not present

## 2023-11-17 DIAGNOSIS — R110 Nausea: Secondary | ICD-10-CM | POA: Insufficient documentation

## 2023-11-17 DIAGNOSIS — J45909 Unspecified asthma, uncomplicated: Secondary | ICD-10-CM | POA: Diagnosis not present

## 2023-11-17 DIAGNOSIS — I10 Essential (primary) hypertension: Secondary | ICD-10-CM | POA: Diagnosis not present

## 2023-11-17 DIAGNOSIS — R1013 Epigastric pain: Secondary | ICD-10-CM | POA: Insufficient documentation

## 2023-11-17 LAB — CBC WITH DIFFERENTIAL/PLATELET
Abs Immature Granulocytes: 0.05 K/uL (ref 0.00–0.07)
Basophils Absolute: 0 K/uL (ref 0.0–0.1)
Basophils Relative: 0 %
Eosinophils Absolute: 0.2 K/uL (ref 0.0–0.5)
Eosinophils Relative: 2 %
HCT: 30.8 % — ABNORMAL LOW (ref 39.0–52.0)
Hemoglobin: 9.9 g/dL — ABNORMAL LOW (ref 13.0–17.0)
Immature Granulocytes: 1 %
Lymphocytes Relative: 28 %
Lymphs Abs: 1.9 K/uL (ref 0.7–4.0)
MCH: 31 pg (ref 26.0–34.0)
MCHC: 32.1 g/dL (ref 30.0–36.0)
MCV: 96.6 fL (ref 80.0–100.0)
Monocytes Absolute: 0.7 K/uL (ref 0.1–1.0)
Monocytes Relative: 10 %
Neutro Abs: 4 K/uL (ref 1.7–7.7)
Neutrophils Relative %: 59 %
Platelets: 182 K/uL (ref 150–400)
RBC: 3.19 MIL/uL — ABNORMAL LOW (ref 4.22–5.81)
RDW: 16 % — ABNORMAL HIGH (ref 11.5–15.5)
WBC: 6.7 K/uL (ref 4.0–10.5)
nRBC: 0 % (ref 0.0–0.2)

## 2023-11-17 LAB — COMPREHENSIVE METABOLIC PANEL WITH GFR
ALT: 20 U/L (ref 0–44)
AST: 27 U/L (ref 15–41)
Albumin: 3.5 g/dL (ref 3.5–5.0)
Alkaline Phosphatase: 64 U/L (ref 38–126)
Anion gap: 13 (ref 5–15)
BUN: 9 mg/dL (ref 6–20)
CO2: 18 mmol/L — ABNORMAL LOW (ref 22–32)
Calcium: 7.8 mg/dL — ABNORMAL LOW (ref 8.9–10.3)
Chloride: 108 mmol/L (ref 98–111)
Creatinine, Ser: 0.93 mg/dL (ref 0.61–1.24)
GFR, Estimated: >60 mL/min (ref >60)
Glucose, Bld: 88 mg/dL (ref 70–99)
Potassium: 3.6 mmol/L (ref 3.5–5.1)
Sodium: 139 mmol/L (ref 135–145)
Total Bilirubin: 0.6 mg/dL (ref 0.0–1.2)
Total Protein: 6.8 g/dL (ref 6.5–8.1)

## 2023-11-17 LAB — LIPASE, BLOOD: Lipase: 21 U/L (ref 11–51)

## 2023-11-17 LAB — TROPONIN I (HIGH SENSITIVITY)
Troponin I (High Sensitivity): 6 ng/L (ref <18)
Troponin I (High Sensitivity): 3 ng/L (ref <18)

## 2023-11-17 MED ORDER — OXYCODONE HCL 10 MG PO TABS
10.0000 mg | ORAL_TABLET | Freq: Four times a day (QID) | ORAL | 0 refills | Status: DC | PRN
  Filled 2023-11-17 (×2): qty 12, 3d supply, fill #0

## 2023-11-17 MED ORDER — ALUM & MAG HYDROXIDE-SIMETH 200-200-20 MG/5ML PO SUSP
30.0000 mL | Freq: Once | ORAL | Status: AC
  Administered 2023-11-17: 30 mL via ORAL
  Filled 2023-11-17: qty 30

## 2023-11-17 MED ORDER — SODIUM CHLORIDE 0.9 % IV BOLUS
500.0000 mL | Freq: Once | INTRAVENOUS | Status: AC
  Administered 2023-11-17: 500 mL via INTRAVENOUS

## 2023-11-17 MED ORDER — ONDANSETRON HCL 4 MG/2ML IJ SOLN
4.0000 mg | Freq: Once | INTRAMUSCULAR | Status: AC
  Administered 2023-11-17: 4 mg via INTRAVENOUS
  Filled 2023-11-17: qty 2

## 2023-11-17 MED ORDER — MORPHINE SULFATE (PF) 4 MG/ML IV SOLN
4.0000 mg | Freq: Once | INTRAVENOUS | Status: AC
  Administered 2023-11-17: 4 mg via INTRAVENOUS
  Filled 2023-11-17: qty 1

## 2023-11-17 MED ORDER — SUCRALFATE 1 GM/10ML PO SUSP
1.0000 g | Freq: Three times a day (TID) | ORAL | 0 refills | Status: DC
  Filled 2023-11-17 (×2): qty 420, 11d supply, fill #0
  Filled 2023-11-17: qty 340, 9d supply, fill #0

## 2023-11-17 MED ORDER — LIDOCAINE VISCOUS HCL 2 % MT SOLN
15.0000 mL | Freq: Four times a day (QID) | OROMUCOSAL | 0 refills | Status: DC | PRN
  Filled 2023-11-17 (×2): qty 200, 4d supply, fill #0

## 2023-11-17 MED ORDER — LIDOCAINE VISCOUS HCL 2 % MT SOLN
15.0000 mL | Freq: Once | OROMUCOSAL | Status: AC
  Administered 2023-11-17: 15 mL via OROMUCOSAL
  Filled 2023-11-17: qty 15

## 2023-11-17 NOTE — ED Provider Notes (Signed)
 Emergency Department Provider Note   I have reviewed the triage vital signs and the nursing notes.   HISTORY  Chief Complaint Chest Pain   HPI [name] is a 53 y.o. male with past history reviewed below including alcohol  abuse, pancreatitis, hypertension presents to the emergency department with central chest pain radiating to the right.  Some pain into the right arm.  He also has some epigastric abdominal discomfort along with continued nausea.  No significant vomiting.  No hematemesis.  He is also developed some diarrhea after recently being constipated.  He was recently hospitalized for syncope.  He has a known history of WPW and had a syncope event leading to fracture of his arm.  He arrives in the splint.  He reports drinking 2 beers earlier today but denies any drug use since his initial hospitalization.    Past Medical History:  Diagnosis Date   Alcoholism /alcohol  abuse    Anemia    Anxiety    Arthritis    knees; arms; elbows (03/26/2015)   Asthma    Bipolar disorder (HCC)    Chronic bronchitis (HCC)    Chronic lower back pain    Chronic pancreatitis (HCC)    Cocaine abuse (HCC)    Depression    Family history of adverse reaction to anesthesia    Femoral condyle fracture (HCC) 03/08/2014   left medial/notes 03/09/2014   GERD (gastroesophageal reflux disease)    H/O hiatal hernia    H/O suicide attempt 10/2012   High cholesterol    History of blood transfusion 10/2012   when I tried to commit suicide   History of stomach ulcers    Hypertension    Marijuana abuse, continuous    Migraine    a few times/year (03/26/2015)   Pancreatitis    Pneumonia 1990's X 3   PTSD (post-traumatic stress disorder)    Seizures (HCC)    Sickle cell trait (HCC)    WPW (Wolff-Parkinson-White syndrome)    thelbert 03/06/2013    Review of Systems  Constitutional: No fever/chills Cardiovascular: Positive chest pain. Respiratory: Denies shortness of  breath. Gastrointestinal: No abdominal pain. Positive nausea, no vomiting. Positive diarrhea.  Skin: Negative for rash. Neurological: Negative for headaches.  ____________________________________________   PHYSICAL EXAM:  VITAL SIGNS: ED Triage Vitals  Encounter Vitals Group     BP 11/17/23 0213 (!) 179/113     Pulse Rate 11/17/23 0213 85     Resp 11/17/23 0213 18     Temp 11/17/23 0213 98.3 F (36.8 C)     Temp Source 11/17/23 0213 Oral     SpO2 11/17/23 0213 100 %   Constitutional: Alert and oriented. Well appearing and in no acute distress. Grimacing; appears uncomfortable.  Eyes: Conjunctivae are normal.  Head: Atraumatic. Nose: No congestion/rhinnorhea. Mouth/Throat: Mucous membranes are moist.  Neck: No stridor. Cardiovascular: Normal rate, regular rhythm. Good peripheral circulation. Grossly normal heart sounds.   Respiratory: Normal respiratory effort.  No retractions. Lungs CTAB. Gastrointestinal: Soft and nontender. No distention.  Musculoskeletal: No lower extremity tenderness nor edema. No gross deformities of extremities. Neurologic:  Normal speech and language.  Skin:  Skin is warm, dry and intact. No rash noted.   ____________________________________________   LABS (all labs ordered are listed, but only abnormal results are displayed)  Labs Reviewed  COMPREHENSIVE METABOLIC PANEL WITH GFR - Abnormal; Notable for the following components:      Result Value   CO2 18 (*)    Calcium   7.8 (*)    All other components within normal limits  CBC WITH DIFFERENTIAL/PLATELET - Abnormal; Notable for the following components:   RBC 3.19 (*)    Hemoglobin 9.9 (*)    HCT 30.8 (*)    RDW 16.0 (*)    All other components within normal limits  LIPASE, BLOOD  TROPONIN I (HIGH SENSITIVITY)  TROPONIN I (HIGH SENSITIVITY)   ____________________________________________  EKG   EKG Interpretation Date/Time:  Friday November 17 2023 02:17:44 EDT Ventricular Rate:  85 PR  Interval:  112 QRS Duration:  65 QT Interval:  340 QTC Calculation: 405 R Axis:   67  Text Interpretation: Sinus rhythm Borderline short PR interval Biatrial enlargement Abnrm T, consider ischemia, anterolateral lds Similar to June 30th tracing Confirmed by Darra Chew [phone]) on 11/17/2023 3:12:22 AM        ____________________________________________  RADIOLOGY  No results found.   ____________________________________________   PROCEDURES  Procedure(s) performed:   Procedures  None ____________________________________________   INITIAL IMPRESSION / ASSESSMENT AND PLAN / ED COURSE  Pertinent labs & imaging results that were available during my care of the patient were reviewed by me and considered in my medical decision making (see chart for details).   This patient is Presenting for Evaluation of CP, which does require a range of treatment options, and is a complaint that involves a high risk of morbidity and mortality.  The Differential Diagnoses includes but is not exclusive to acute coronary syndrome, aortic dissection, pulmonary embolism, cardiac tamponade, community-acquired pneumonia, pericarditis, musculoskeletal chest wall pain, etc.   Critical Interventions-    Medications  morphine  (PF) 4 MG/ML injection 4 mg (4 mg Intravenous Given 11/17/23 0232)  ondansetron  (ZOFRAN ) injection 4 mg (4 mg Intravenous Given 11/17/23 0232)  sodium chloride  0.9 % bolus 500 mL (0 mLs Intravenous Stopped 11/17/23 0339)  alum & mag hydroxide-simeth (MAALOX/MYLANTA) 200-200-20 MG/5ML suspension 30 mL (30 mLs Oral Given 11/17/23 0231)  lidocaine  (XYLOCAINE ) 2 % viscous mouth solution 15 mL (15 mLs Mouth/Throat Given 11/17/23 0231)  morphine  (PF) 4 MG/ML injection 4 mg (4 mg Intravenous Given 11/17/23 0501)    Reassessment after intervention: pain improved.    Clinical Laboratory Tests Ordered, included troponin normal.  Normal lipase.  No leukocytosis.  Mild anemia to 9.9.  No  acute kidney injury. LFTs normal.   Radiologic Tests Ordered, included CXR. I independently interpreted the images and agree with radiology interpretation.   Cardiac Monitor Tracing which shows NSR.    Social Determinants of Health Risk patient is a smoker and drinks EtOH regularly.   Medical Decision Making: Summary:  The patient presents to the emergency department for evaluation of chest pain.  No tenderness in the abdomen.  Plan for screening blood work.  EKG similar to prior tracings from June.   Reevaluation with update and discussion with patient. Pain improved. Plan for meds to the pharmacy and PCP follow up plan.   Patient's presentation is most consistent with acute presentation with potential threat to life or bodily function.   Disposition: discharge  ____________________________________________  FINAL CLINICAL IMPRESSION(S) / ED DIAGNOSES  Final diagnoses:  Precordial chest pain     NEW OUTPATIENT MEDICATIONS STARTED DURING THIS VISIT:  Discharge Medication List as of 11/17/2023  6:28 AM     START taking these medications   Details  sucralfate  (CARAFATE ) 1 GM/10ML suspension Take 10 mLs (1 g total) by mouth 4 (four) times daily -  with meals and at bedtime., Starting Fri 11/17/2023,  Normal        Note:  This document was prepared using Dragon voice recognition software and may include unintentional dictation errors.  Fonda Law, MD, [facility] Emergency Medicine    Jearld Hemp, Fonda MATSU, MD 11/18/23 [phone]

## 2023-11-17 NOTE — Discharge Instructions (Addendum)

## 2023-11-17 NOTE — ED Notes (Signed)
 Patient transported to X-ray

## 2023-11-17 NOTE — ED Notes (Signed)
Pt discharged. Pt given discharge papers and papers explained. Pt in NAD at this time

## 2023-11-17 NOTE — ED Triage Notes (Signed)
 Pt bib [facility] for left sided chest pain. Pt also having sharp pain in his right arm. Pt also has hx of pancreatitis and had some alcohol  tonight.

## 2023-11-18 ENCOUNTER — Other Ambulatory Visit (HOSPITAL_COMMUNITY): Payer: Self-pay

## 2023-11-20 ENCOUNTER — Other Ambulatory Visit (HOSPITAL_COMMUNITY): Payer: Self-pay

## 2023-11-21 ENCOUNTER — Inpatient Hospital Stay (HOSPITAL_COMMUNITY)
Admission: EM | Admit: 2023-11-21 | Discharge: 2023-11-23 | DRG: 392 | Disposition: A | Discharge status: Home / Self Care | Payer: MEDICAID | Attending: Family Medicine | Admitting: Family Medicine

## 2023-11-21 ENCOUNTER — Other Ambulatory Visit (HOSPITAL_COMMUNITY): Payer: Self-pay

## 2023-11-21 ENCOUNTER — Emergency Department (HOSPITAL_COMMUNITY): Payer: MEDICAID

## 2023-11-21 ENCOUNTER — Encounter (HOSPITAL_COMMUNITY): Payer: Self-pay | Admitting: Emergency Medicine

## 2023-11-21 ENCOUNTER — Other Ambulatory Visit: Payer: Self-pay

## 2023-11-21 DIAGNOSIS — K860 Alcohol-induced chronic pancreatitis: Secondary | ICD-10-CM

## 2023-11-21 DIAGNOSIS — Z91041 Radiographic dye allergy status: Secondary | ICD-10-CM

## 2023-11-21 DIAGNOSIS — I480 Paroxysmal atrial fibrillation: Secondary | ICD-10-CM | POA: Diagnosis present

## 2023-11-21 DIAGNOSIS — E872 Acidosis, unspecified: Secondary | ICD-10-CM | POA: Diagnosis present

## 2023-11-21 DIAGNOSIS — Z8249 Family history of ischemic heart disease and other diseases of the circulatory system: Secondary | ICD-10-CM

## 2023-11-21 DIAGNOSIS — F4310 Post-traumatic stress disorder, unspecified: Secondary | ICD-10-CM | POA: Diagnosis present

## 2023-11-21 DIAGNOSIS — Z6372 Alcoholism and drug addiction in family: Secondary | ICD-10-CM

## 2023-11-21 DIAGNOSIS — F1010 Alcohol abuse, uncomplicated: Secondary | ICD-10-CM | POA: Diagnosis present

## 2023-11-21 DIAGNOSIS — F1490 Cocaine use, unspecified, uncomplicated: Secondary | ICD-10-CM

## 2023-11-21 DIAGNOSIS — F1910 Other psychoactive substance abuse, uncomplicated: Secondary | ICD-10-CM | POA: Diagnosis present

## 2023-11-21 DIAGNOSIS — I456 Pre-excitation syndrome: Secondary | ICD-10-CM | POA: Diagnosis present

## 2023-11-21 DIAGNOSIS — D573 Sickle-cell trait: Secondary | ICD-10-CM | POA: Diagnosis present

## 2023-11-21 DIAGNOSIS — K2920 Alcoholic gastritis without bleeding: Principal | ICD-10-CM | POA: Diagnosis present

## 2023-11-21 DIAGNOSIS — R1013 Epigastric pain: Secondary | ICD-10-CM

## 2023-11-21 DIAGNOSIS — Z8701 Personal history of pneumonia (recurrent): Secondary | ICD-10-CM

## 2023-11-21 DIAGNOSIS — K861 Other chronic pancreatitis: Secondary | ICD-10-CM | POA: Diagnosis present

## 2023-11-21 DIAGNOSIS — R079 Chest pain, unspecified: Secondary | ICD-10-CM

## 2023-11-21 DIAGNOSIS — F1410 Cocaine abuse, uncomplicated: Secondary | ICD-10-CM | POA: Diagnosis not present

## 2023-11-21 DIAGNOSIS — Z8711 Personal history of peptic ulcer disease: Secondary | ICD-10-CM

## 2023-11-21 DIAGNOSIS — I161 Hypertensive emergency: Secondary | ICD-10-CM | POA: Diagnosis present

## 2023-11-21 DIAGNOSIS — F1020 Alcohol dependence, uncomplicated: Secondary | ICD-10-CM | POA: Diagnosis present

## 2023-11-21 DIAGNOSIS — F332 Major depressive disorder, recurrent severe without psychotic features: Secondary | ICD-10-CM | POA: Diagnosis present

## 2023-11-21 DIAGNOSIS — F1210 Cannabis abuse, uncomplicated: Secondary | ICD-10-CM | POA: Diagnosis present

## 2023-11-21 DIAGNOSIS — Z720 Tobacco use: Secondary | ICD-10-CM | POA: Diagnosis present

## 2023-11-21 DIAGNOSIS — Z811 Family history of alcohol abuse and dependence: Secondary | ICD-10-CM

## 2023-11-21 DIAGNOSIS — K863 Pseudocyst of pancreas: Secondary | ICD-10-CM | POA: Diagnosis present

## 2023-11-21 DIAGNOSIS — D529 Folate deficiency anemia, unspecified: Secondary | ICD-10-CM | POA: Diagnosis present

## 2023-11-21 DIAGNOSIS — R109 Unspecified abdominal pain: Secondary | ICD-10-CM | POA: Diagnosis present

## 2023-11-21 DIAGNOSIS — F17210 Nicotine dependence, cigarettes, uncomplicated: Secondary | ICD-10-CM | POA: Diagnosis present

## 2023-11-21 DIAGNOSIS — Z79899 Other long term (current) drug therapy: Secondary | ICD-10-CM

## 2023-11-21 DIAGNOSIS — K862 Cyst of pancreas: Secondary | ICD-10-CM | POA: Diagnosis present

## 2023-11-21 DIAGNOSIS — K219 Gastro-esophageal reflux disease without esophagitis: Secondary | ICD-10-CM | POA: Diagnosis present

## 2023-11-21 DIAGNOSIS — F319 Bipolar disorder, unspecified: Secondary | ICD-10-CM | POA: Diagnosis present

## 2023-11-21 DIAGNOSIS — R0789 Other chest pain: Principal | ICD-10-CM

## 2023-11-21 DIAGNOSIS — F1090 Alcohol use, unspecified, uncomplicated: Secondary | ICD-10-CM

## 2023-11-21 DIAGNOSIS — E7800 Pure hypercholesterolemia, unspecified: Secondary | ICD-10-CM | POA: Diagnosis present

## 2023-11-21 DIAGNOSIS — I10 Essential (primary) hypertension: Secondary | ICD-10-CM | POA: Diagnosis present

## 2023-11-21 DIAGNOSIS — Z808 Family history of malignant neoplasm of other organs or systems: Secondary | ICD-10-CM

## 2023-11-21 DIAGNOSIS — D638 Anemia in other chronic diseases classified elsewhere: Secondary | ICD-10-CM | POA: Diagnosis present

## 2023-11-21 DIAGNOSIS — J4489 Other specified chronic obstructive pulmonary disease: Secondary | ICD-10-CM | POA: Diagnosis present

## 2023-11-21 DIAGNOSIS — N400 Enlarged prostate without lower urinary tract symptoms: Secondary | ICD-10-CM | POA: Diagnosis present

## 2023-11-21 LAB — CBC WITH DIFFERENTIAL/PLATELET
Abs Immature Granulocytes: 0.05 K/uL (ref 0.00–0.07)
Basophils Absolute: 0 K/uL (ref 0.0–0.1)
Basophils Relative: 0 %
Eosinophils Absolute: 0.2 K/uL (ref 0.0–0.5)
Eosinophils Relative: 2 %
HCT: 33.1 % — ABNORMAL LOW (ref 39.0–52.0)
Hemoglobin: 10.9 g/dL — ABNORMAL LOW (ref 13.0–17.0)
Immature Granulocytes: 1 %
Lymphocytes Relative: 39 %
Lymphs Abs: 2.7 K/uL (ref 0.7–4.0)
MCH: 30.3 pg (ref 26.0–34.0)
MCHC: 32.9 g/dL (ref 30.0–36.0)
MCV: 91.9 fL (ref 80.0–100.0)
Monocytes Absolute: 0.7 K/uL (ref 0.1–1.0)
Monocytes Relative: 10 %
Neutro Abs: 3.4 K/uL (ref 1.7–7.7)
Neutrophils Relative %: 48 %
Platelets: 217 K/uL (ref 150–400)
RBC: 3.6 MIL/uL — ABNORMAL LOW (ref 4.22–5.81)
RDW: 15.9 % — ABNORMAL HIGH (ref 11.5–15.5)
WBC: 7 K/uL (ref 4.0–10.5)
nRBC: 0 % (ref 0.0–0.2)

## 2023-11-21 LAB — LIPASE, BLOOD: Lipase: 24 U/L (ref 11–51)

## 2023-11-21 LAB — COMPREHENSIVE METABOLIC PANEL WITH GFR
ALT: 26 U/L (ref 0–44)
AST: 31 U/L (ref 15–41)
Albumin: 3.9 g/dL (ref 3.5–5.0)
Alkaline Phosphatase: 73 U/L (ref 38–126)
Anion gap: 14 (ref 5–15)
BUN: 6 mg/dL (ref 6–20)
CO2: 19 mmol/L — ABNORMAL LOW (ref 22–32)
Calcium: 7.6 mg/dL — ABNORMAL LOW (ref 8.9–10.3)
Chloride: 106 mmol/L (ref 98–111)
Creatinine, Ser: 0.86 mg/dL (ref 0.61–1.24)
GFR, Estimated: >60 mL/min (ref >60)
Glucose, Bld: 74 mg/dL (ref 70–99)
Potassium: 3.8 mmol/L (ref 3.5–5.1)
Sodium: 139 mmol/L (ref 135–145)
Total Bilirubin: 0.6 mg/dL (ref 0.0–1.2)
Total Protein: 7.4 g/dL (ref 6.5–8.1)

## 2023-11-21 LAB — RAPID URINE DRUG SCREEN, HOSP PERFORMED
Amphetamines: NOT DETECTED
Barbiturates: NOT DETECTED
Benzodiazepines: NOT DETECTED
Cocaine: POSITIVE — AB
Opiates: NOT DETECTED
Tetrahydrocannabinol: NOT DETECTED

## 2023-11-21 LAB — D-DIMER, QUANTITATIVE: D-Dimer, Quant: 0.4 ug{FEU}/mL (ref 0.00–0.50)

## 2023-11-21 LAB — TROPONIN I (HIGH SENSITIVITY)
Troponin I (High Sensitivity): 5 ng/L (ref <18)
Troponin I (High Sensitivity): 4 ng/L (ref <18)

## 2023-11-21 LAB — ETHANOL: Alcohol, Ethyl (B): 22 mg/dL — ABNORMAL HIGH (ref <15)

## 2023-11-21 MED ORDER — IOHEXOL 350 MG/ML SOLN
100.0000 mL | Freq: Once | INTRAVENOUS | Status: AC | PRN
  Administered 2023-11-21: 100 mL via INTRAVENOUS

## 2023-11-21 MED ORDER — MORPHINE SULFATE (PF) 4 MG/ML IV SOLN
4.0000 mg | Freq: Once | INTRAVENOUS | Status: AC
  Administered 2023-11-21: 4 mg via INTRAVENOUS
  Filled 2023-11-21: qty 1

## 2023-11-21 MED ORDER — METHYLPREDNISOLONE SODIUM SUCC 40 MG IJ SOLR
40.0000 mg | Freq: Once | INTRAMUSCULAR | Status: AC
  Administered 2023-11-21: 40 mg via INTRAVENOUS
  Filled 2023-11-21: qty 1

## 2023-11-21 MED ORDER — DIPHENHYDRAMINE HCL 25 MG PO CAPS: 50.0000 mg | ORAL_CAPSULE | Freq: Once | ORAL | Status: AC

## 2023-11-21 MED ORDER — LORAZEPAM 1 MG PO TABS
1.0000 mg | ORAL_TABLET | Freq: Once | ORAL | Status: AC
  Administered 2023-11-21: 1 mg via ORAL
  Filled 2023-11-21: qty 1

## 2023-11-21 MED ORDER — ONDANSETRON HCL 4 MG/2ML IJ SOLN
4.0000 mg | Freq: Once | INTRAMUSCULAR | Status: AC
  Administered 2023-11-21: 4 mg via INTRAVENOUS
  Filled 2023-11-21: qty 2

## 2023-11-21 MED ORDER — DIPHENHYDRAMINE HCL 50 MG/ML IJ SOLN
50.0000 mg | Freq: Once | INTRAMUSCULAR | Status: AC
  Administered 2023-11-21: 50 mg via INTRAVENOUS
  Filled 2023-11-21: qty 1

## 2023-11-21 MED ORDER — LACTATED RINGERS IV BOLUS
1000.0000 mL | Freq: Once | INTRAVENOUS | Status: AC
  Administered 2023-11-21: 1000 mL via INTRAVENOUS

## 2023-11-21 NOTE — ED Notes (Signed)
 Multiple unsuccessful attempts at drawing labs on patient by other nurses, phlebotomy, and this RN. EDP notified.

## 2023-11-21 NOTE — ED Notes (Signed)
 Unable to get labs, stuck pt twice.

## 2023-11-21 NOTE — H&P (Incomplete)
 History and Physical    Patient: [name] FMW:[id number] DOB: [birth year] DOA: 11/21/2023 DOS: the patient was seen and examined on 11/21/2023 PCP: Celestia Rosaline SQUIBB, NP  Patient coming from: {Point_of_Origin:[address]}  Chief Complaint:  Chief Complaint  Patient presents with  . Chest Pain   HPI: [name] is a 53 y.o. male with medical history significant for cocaine abuse, alcohol  abuse, chronic pancreatitis, WPW syndrome status post ablation, and paroxysmal atrial fibrillation. This is his 18th ED visit in 3 months usually for chest and or abdominal pain. One week ago he was discharged after being treated for a syncopal episode.  He sustained a arm fracture with his syncope.  He was discharged with a ZOLL patch.  Today the patient comes in because of chest pain.  He does not have coronary artery disease just the arrhythmias.       Review of Systems: {ROS_Text:26778} Past Medical History:  Diagnosis Date  . Alcoholism /alcohol  abuse   . Anemia   . Anxiety   . Arthritis    knees; arms; elbows (03/26/2015)  . Asthma   . Bipolar disorder (HCC)   . Chronic bronchitis (HCC)   . Chronic lower back pain   . Chronic pancreatitis (HCC)   . Cocaine abuse (HCC)   . Depression   . Family history of adverse reaction to anesthesia   . Femoral condyle fracture (HCC) 03/08/2014   left medial/notes 03/09/2014  . GERD (gastroesophageal reflux disease)   . H/O hiatal hernia   . H/O suicide attempt 10/2012  . High cholesterol   . History of blood transfusion 10/2012   when I tried to commit suicide  . History of stomach ulcers   . Hypertension   . Marijuana abuse, continuous   . Migraine    a few times/year (03/26/2015)  . Pancreatitis   . Pneumonia 1990's X 3  . PTSD (post-traumatic stress disorder)   . Seizures (HCC)   . Sickle cell trait (HCC)   . WPW (Wolff-Parkinson-White syndrome)    thelbert 03/06/2013   Past Surgical History:  Procedure Laterality  Date  . BIOPSY  11/25/2017   Procedure: BIOPSY;  Surgeon: Burnette Fallow, MD;  Location: [facility] ENDOSCOPY;  Service: Endoscopy;;  . BIOPSY  10/14/2018   Procedure: BIOPSY;  Surgeon: Burnette Fallow, MD;  Location: [facility] ENDOSCOPY;  Service: Endoscopy;;  . BIOPSY  03/06/2023   Procedure: BIOPSY;  Surgeon: Wilhelmenia Aloha Raddle., MD;  Location: [facility] ENDOSCOPY;  Service: Gastroenterology;;  . CARDIAC CATHETERIZATION    . CYST ENTEROSTOMY  01/02/2020   Procedure: CYST ASPIRATION;  Surgeon: Teressa Toribio SQUIBB, MD;  Location: [facility] ENDOSCOPY;  Service: Endoscopy;;  . ESOPHAGOGASTRODUODENOSCOPY N/A 03/06/2023   Procedure: ESOPHAGOGASTRODUODENOSCOPY (EGD);  Surgeon: Wilhelmenia Aloha Raddle., MD;  Location: [facility] ENDOSCOPY;  Service: Gastroenterology;  Laterality: N/A;  . ESOPHAGOGASTRODUODENOSCOPY (EGD) WITH PROPOFOL  N/A 11/25/2017   Procedure: ESOPHAGOGASTRODUODENOSCOPY (EGD) WITH PROPOFOL ;  Surgeon: Burnette Fallow, MD;  Location: [facility] ENDOSCOPY;  Service: Endoscopy;  Laterality: N/A;  . ESOPHAGOGASTRODUODENOSCOPY (EGD) WITH PROPOFOL  Left 10/14/2018   Procedure: ESOPHAGOGASTRODUODENOSCOPY (EGD) WITH PROPOFOL ;  Surgeon: Burnette Fallow, MD;  Location: [facility] ENDOSCOPY;  Service: Endoscopy;  Laterality: Left;  . ESOPHAGOGASTRODUODENOSCOPY (EGD) WITH PROPOFOL  N/A 11/14/2018   Procedure: ESOPHAGOGASTRODUODENOSCOPY (EGD) WITH PROPOFOL ;  Surgeon: Celestia Agent, MD;  Location: [facility] ENDOSCOPY;  Service: Gastroenterology;  Laterality: N/A;  . ESOPHAGOGASTRODUODENOSCOPY (EGD) WITH PROPOFOL  N/A 01/02/2020   Procedure: ESOPHAGOGASTRODUODENOSCOPY (EGD) WITH PROPOFOL ;  Surgeon: Teressa Toribio SQUIBB, MD;  Location: [facility] ENDOSCOPY;  Service: Endoscopy;  Laterality: N/A;  . ESOPHAGOGASTRODUODENOSCOPY (EGD) WITH PROPOFOL  N/A 10/25/2020   Procedure: ESOPHAGOGASTRODUODENOSCOPY (EGD) WITH PROPOFOL ;  Surgeon: Wilhelmenia Aloha Raddle., MD;  Location: [facility] ENDOSCOPY;  Service: Gastroenterology;  Laterality: N/A;  . EUS N/A 01/02/2020   Procedure: UPPER ENDOSCOPIC  ULTRASOUND (EUS) RADIAL;  Surgeon: Teressa Toribio SQUIBB, MD;  Location: [facility] ENDOSCOPY;  Service: Endoscopy;  Laterality: N/A;  . EUS N/A 03/06/2023   Procedure: UPPER ENDOSCOPIC ULTRASOUND (EUS) RADIAL;  Surgeon: Wilhelmenia Aloha Raddle., MD;  Location: [facility] ENDOSCOPY;  Service: Gastroenterology;  Laterality: N/A;  . EYE SURGERY Left 1990's   result of trauma   . FACIAL FRACTURE SURGERY Left 1990's   result of trauma   . FINE NEEDLE ASPIRATION N/A 03/06/2023   Procedure: FINE NEEDLE ASPIRATION (FNA) LINEAR;  Surgeon: Wilhelmenia Aloha Raddle., MD;  Location: [facility] ENDOSCOPY;  Service: Gastroenterology;  Laterality: N/A;  . FLEXIBLE SIGMOIDOSCOPY N/A 10/25/2020   Procedure: FLEXIBLE SIGMOIDOSCOPY;  Surgeon: Wilhelmenia Aloha Raddle., MD;  Location: [facility] ENDOSCOPY;  Service: Gastroenterology;  Laterality: N/A;  . FRACTURE SURGERY    . HEMOSTASIS CLIP PLACEMENT  10/25/2020   Procedure: HEMOSTASIS CLIP PLACEMENT;  Surgeon: Wilhelmenia Aloha Raddle., MD;  Location: [facility] ENDOSCOPY;  Service: Gastroenterology;;  . HERNIA REPAIR    . HOT HEMOSTASIS N/A 03/06/2023   Procedure: HOT HEMOSTASIS (ARGON PLASMA COAGULATION/BICAP);  Surgeon: Wilhelmenia Aloha Raddle., MD;  Location: [facility]OSCOPY;  Service: Gastroenterology;  Laterality: N/A;  . LEFT HEART CATHETERIZATION WITH CORONARY ANGIOGRAM Right 03/07/2013   Procedure: LEFT HEART CATHETERIZATION WITH CORONARY ANGIOGRAM;  Surgeon: Salena GORMAN Negri, MD;  Location: [facility] CATH LAB;  Service: Cardiovascular;  Laterality: Right;  . UMBILICAL HERNIA REPAIR    . UPPER GASTROINTESTINAL ENDOSCOPY     Social History:  reports that he has been smoking cigarettes and e-cigarettes. He has a 36 pack-year smoking history. He has never used smokeless tobacco. He reports that he does not currently use alcohol  after a past usage of about 4.0 standard drinks of alcohol  per week. He reports current drug use. Drugs: Marijuana and Cocaine.  Allergies  Allergen Reactions  . Robaxin  [employer] ]  Other (See Comments)    jumpy limbs  . Aspirin  Other (See Comments)    Unknown reaction  . Shellfish-Derived Products Nausea And Vomiting and Rash  . Trazodone  And Nefazodone Other (See Comments)    Muscle spasms  . Adhesive [Tape] Itching  . Fish-Derived Products   . Latex Itching  . Toradol  [Ketorolac  Tromethamine ] Other (See Comments)    Has ulcers; cannot have this  . Contrast Media [Iodinated Contrast Media] Hives  . Reglan  [Metoclopramide ] Other (See Comments)    Muscle spasms  . Salmon [Fish Oil] Nausea And Vomiting and Rash    Family History  Problem Relation Age of Onset  . Hypertension Mother   . Cirrhosis Father   . Alcoholism Father   . Hypertension Father   . Melanoma Father   . Hypertension Other   . Coronary artery disease Other     Prior to Admission medications   Medication Sig Start Date End Date Taking? Authorizing Provider  acetaminophen  (TYLENOL ) 500 MG tablet Take 1,000 mg by mouth daily as needed for mild pain (pain score 1-3) or moderate pain (pain score 4-6).    [provider]  calcium -vitamin D  (OSCAL WITH D) 500-5 MG-MCG tablet Take 1 tablet by mouth daily with breakfast. 09/18/23   Yolande Lamar BROCKS, MD  carvedilol  (COREG ) 25 MG tablet Take 1 tablet (25 mg total) by mouth 2 (two)  times daily with a meal. 11/13/23   Amin, Ankit C, MD  dicyclomine  (BENTYL ) 20 MG tablet Take 1 tablet (20 mg total) by mouth daily as needed for spasms. Patient not taking: Reported on 08/26/2023 08/21/23   Raenelle Donalda HERO, MD  famotidine  (PEPCID ) 40 MG tablet Take 1 tablet (40 mg total) by mouth daily. 10/17/23   Henderly, Britni A, PA-C  fluticasone  (FLONASE ) 50 MCG/ACT nasal spray Place 2 sprays into both nostrils daily as needed for allergies.    [provider]  folic acid  (FOLVITE ) 1 MG tablet Take 1 tablet (1 mg total) by mouth daily. Patient not taking: Reported on 09/18/2023 08/21/23   Raenelle Donalda HERO, MD  lidocaine  (XYLOCAINE ) 2 % solution Use  as directed 15 mLs in the mouth or throat every 6 (six) hours as needed for mouth pain. 11/17/23   Long, Fonda MATSU, MD  lipase/protease/amylase (CREON ) 36000 UNITS CPEP capsule Take 2 capsules (72,000 Units total) by mouth 3 (three) times daily with meals. May also take 1 capsule (36,000 Units total) as needed (with snacks). 08/21/23   Ghimire, Donalda HERO, MD  loperamide  (IMODIUM ) 2 MG capsule Take 2 capsules (4 mg total) by mouth as needed for diarrhea or loose stools. 09/25/23   Mesner, Selinda, MD  magnesium  oxide (MAG-OX) 400 (240 Mg) MG tablet Take 1 tablet (400 mg total) by mouth daily. 11/07/23   Hoy Nidia FALCON, PA-C  Multiple Vitamin (MULTIVITAMIN WITH MINERALS) TABS tablet Take 1 tablet by mouth daily. 08/21/23   Ghimire, Donalda HERO, MD  nicotine  (NICODERM CQ  - DOSED IN MG/24 HOURS) 21 mg/24hr patch Place 1 patch (21 mg total) onto the skin daily. 11/14/23   Amin, Ankit C, MD  ondansetron  (ZOFRAN -ODT) 4 MG disintegrating tablet Take 1 tablet (4 mg total) by mouth every 8 (eight) hours as needed for nausea or vomiting. 11/13/23   Amin, Ankit C, MD  Oxycodone  HCl 10 MG TABS Take 1 tablet (10 mg total) by mouth every 6 (six) hours as needed. 11/17/23   Long, Fonda MATSU, MD  pantoprazole  (PROTONIX ) 20 MG tablet Take 1 tablet (20 mg total) by mouth daily before breakfast. 11/13/23   Amin, Ankit C, MD  polyethylene glycol powder (GLYCOLAX /MIRALAX ) 17 GM/SCOOP powder Take 17 g by mouth daily as needed for mild constipation. 11/13/23   Amin, Ankit C, MD  psyllium (HYDROCIL/METAMUCIL) 95 % PACK Take 1 packet by mouth daily. Patient not taking: Reported on 09/18/2023 08/21/23   Raenelle Donalda HERO, MD  sucralfate  (CARAFATE ) 1 GM/10ML suspension Take 10 mLs (1 g total) by mouth 4 (four) times daily -  with meals and at bedtime. 11/17/23   Long, Fonda MATSU, MD  tamsulosin  (FLOMAX ) 0.4 MG CAPS capsule Take 1 capsule (0.4 mg total) by mouth daily. 08/21/23   Ghimire, Donalda HERO, MD  thiamine  (VITAMIN B1) 100 MG tablet Take 1  tablet (100 mg total) by mouth daily. 08/21/23   Ghimire, Donalda HERO, MD  amitriptyline  (ELAVIL ) 25 MG tablet Take 1 tablet (25 mg total) by mouth at bedtime. Patient not taking: Reported on 08/08/2019 10/15/18 08/08/19  Tobie Yetta HERO, MD    Physical Exam: Vitals:   11/21/23 2208 11/21/23 2230 11/21/23 2300 11/21/23 2315  BP:  (!) 145/95 (!) 135/93 (!) 162/89  Pulse:  97 97 91  Resp:  20 16 16   Temp: 97.8 F (36.6 C)     TempSrc: Oral     SpO2:  98% 98% 100%   *** Data Reviewed: {Tip  this will not be part of the note when signed- Document your independent interpretation of telemetry tracing, EKG, lab, Radiology test or any other diagnostic tests. Add any new diagnostic test ordered today. (Optional):26781} Results for orders placed or performed during the hospital encounter of 11/21/23 (from the past 24 hours)  Urine rapid drug screen (hosp performed)     Status: Abnormal   Collection Time: 11/21/23 11:53 AM  Result Value Ref Range   Opiates NONE DETECTED NONE DETECTED   Cocaine POSITIVE (A) NONE DETECTED   Benzodiazepines NONE DETECTED NONE DETECTED   Amphetamines NONE DETECTED NONE DETECTED   Tetrahydrocannabinol NONE DETECTED NONE DETECTED   Barbiturates NONE DETECTED NONE DETECTED  CBC with Differential     Status: Abnormal   Collection Time: 11/21/23  6:39 PM  Result Value Ref Range   WBC 7.0 4.0 - 10.5 K/uL   RBC 3.60 (L) 4.22 - 5.81 MIL/uL   Hemoglobin 10.9 (L) 13.0 - 17.0 g/dL   HCT 66.8 (L) 60.9 - 47.9 %   MCV 91.9 80.0 - 100.0 fL   MCH 30.3 26.0 - 34.0 pg   MCHC 32.9 30.0 - 36.0 g/dL   RDW 84.0 (H) 88.4 - 84.4 %   Platelets 217 150 - 400 K/uL   nRBC 0.0 0.0 - 0.2 %   Neutrophils Relative % 48 %   Neutro Abs 3.4 1.7 - 7.7 K/uL   Lymphocytes Relative 39 %   Lymphs Abs 2.7 0.7 - 4.0 K/uL   Monocytes Relative 10 %   Monocytes Absolute 0.7 0.1 - 1.0 K/uL   Eosinophils Relative 2 %   Eosinophils Absolute 0.2 0.0 - 0.5 K/uL   Basophils Relative 0 %   Basophils  Absolute 0.0 0.0 - 0.1 K/uL   Immature Granulocytes 1 %   Abs Immature Granulocytes 0.05 0.00 - 0.07 K/uL  Troponin I (High Sensitivity)     Status: None   Collection Time: 11/21/23  6:39 PM  Result Value Ref Range   Troponin I (High Sensitivity) 5 <18 ng/L  Comprehensive metabolic panel     Status: Abnormal   Collection Time: 11/21/23  6:39 PM  Result Value Ref Range   Sodium 139 135 - 145 mmol/L   Potassium 3.8 3.5 - 5.1 mmol/L   Chloride 106 98 - 111 mmol/L   CO2 19 (L) 22 - 32 mmol/L   Glucose, Bld 74 70 - 99 mg/dL   BUN 6 6 - 20 mg/dL   Creatinine, Ser 9.13 0.61 - 1.24 mg/dL   Calcium  7.6 (L) 8.9 - 10.3 mg/dL   Total Protein 7.4 6.5 - 8.1 g/dL   Albumin  3.9 3.5 - 5.0 g/dL   AST 31 15 - 41 U/L   ALT 26 0 - 44 U/L   Alkaline Phosphatase 73 38 - 126 U/L   Total Bilirubin 0.6 0.0 - 1.2 mg/dL   GFR, Estimated >39 >39 mL/min   Anion gap 14 5 - 15  Lipase, blood     Status: None   Collection Time: 11/21/23  6:39 PM  Result Value Ref Range   Lipase 24 11 - 51 U/L  Ethanol     Status: Abnormal   Collection Time: 11/21/23  6:39 PM  Result Value Ref Range   Alcohol , Ethyl (B) 22 (H) <15 mg/dL  D-dimer, quantitative     Status: None   Collection Time: 11/21/23  6:39 PM  Result Value Ref Range   D-Dimer, Quant 0.40 0.00 - 0.50 ug/mL-FEU  Troponin I (High Sensitivity)     Status: None   Collection Time: 11/21/23  8:38 PM  Result Value Ref Range   Troponin I (High Sensitivity) 4 <18 ng/L     Assessment and Plan: Chest pain H/o WPW Cocaine abuse Chronic pancreatitis    Advance Care Planning:   Code Status: Prior ***  Consults: ***  Family Communication: ***  Severity of Illness: {Observation/Inpatient:21159}  Author: ARTHEA CHILD, MD 11/21/2023 11:45 PM  For on call review [email or web address].

## 2023-11-21 NOTE — ED Notes (Signed)
 Patient transported to CT

## 2023-11-21 NOTE — ED Provider Triage Note (Signed)
 Emergency Medicine Provider Triage Evaluation Note  [name] , a 53 y.o. male  was evaluated in triage.  Pt complains of chest pain.  Patient reports chest pain, nausea, vomiting, diarrhea, malaise, fatigue.  Symptoms began over the last 2 days per his report.  Review of Systems  Positive: Nausea, vomiting, diarrhea, chest pain, fatigue Negative: Shortness of breath, fever  Physical Exam  BP (!) 154/128 (BP Location: Right Arm)   Pulse 85   Temp 98.2 F (36.8 C) (Oral)   Resp 16   SpO2 100%  Gen:   Awake, no distress   Resp:  Normal effort  MSK:   Moves extremities without difficulty  Other:    Medical Decision Making  Medically screening exam initiated at 12:01 PM.  Appropriate orders placed.  Korry Teagan Heidrick was informed that the remainder of the evaluation will be completed by another provider, this initial triage assessment does not replace that evaluation, and the importance of remaining in the ED until their evaluation is complete.  Patient understands MSE process.  Patient understands need to remain in the ED until completion of his evaluation and treatment.   Laurice Maude BROCKS, MD 11/21/23 [phone]

## 2023-11-21 NOTE — ED Provider Notes (Signed)
 [place] [facility] AT [facility] Provider Note   CSN: [id number] Arrival date & time: 11/21/23  1020     History  Chief Complaint  Patient presents with   Chest Pain    [name] is a 53 y.o. male with history of WPW status post ablation in 2015, paroxysmal A-fib, polysubstance abuse, chronic pancreatitis who presents with chest pain, nausea, vomiting, diarrhea, malaise, fatigue. Symptoms began over the last 2 days per his report.  Per chart review was admitted from 11/10/2023 to 11/13/2023 for syncope, echo was unremarkable, recommended cardiology follow-up with ambulatory cardiac monitor. Endorses cocaine use last on Friday. Endorses alcohol  use 3-6 12oz beers per day.   Past Medical History:  Diagnosis Date   Alcoholism /alcohol  abuse    Anemia    Anxiety    Arthritis    knees; arms; elbows (03/26/2015)   Asthma    Bipolar disorder (HCC)    Chronic bronchitis (HCC)    Chronic lower back pain    Chronic pancreatitis (HCC)    Cocaine abuse (HCC)    Depression    Family history of adverse reaction to anesthesia    Femoral condyle fracture (HCC) 03/08/2014   left medial/notes 03/09/2014   GERD (gastroesophageal reflux disease)    H/O hiatal hernia    H/O suicide attempt 10/2012   High cholesterol    History of blood transfusion 10/2012   when I tried to commit suicide   History of stomach ulcers    Hypertension    Marijuana abuse, continuous    Migraine    a few times/year (03/26/2015)   Pancreatitis    Pneumonia 1990's X 3   PTSD (post-traumatic stress disorder)    Seizures (HCC)    Sickle cell trait (HCC)    WPW (Wolff-Parkinson-White syndrome)    thelbert 03/06/2013       Home Medications Prior to Admission medications   Medication Sig Start Date End Date Taking? Authorizing Provider  acetaminophen  (TYLENOL ) 500 MG tablet Take 1,000 mg by mouth daily as needed for mild pain (pain score 1-3) or moderate pain (pain score 4-6).     [provider]  calcium -vitamin D  (OSCAL WITH D) 500-5 MG-MCG tablet Take 1 tablet by mouth daily with breakfast. 09/18/23   Yolande Lamar BROCKS, MD  carvedilol  (COREG ) 25 MG tablet Take 1 tablet (25 mg total) by mouth 2 (two) times daily with a meal. 11/13/23   Amin, Ankit C, MD  dicyclomine  (BENTYL ) 20 MG tablet Take 1 tablet (20 mg total) by mouth daily as needed for spasms. Patient not taking: Reported on 08/26/2023 08/21/23   Raenelle Donalda HERO, MD  famotidine  (PEPCID ) 40 MG tablet Take 1 tablet (40 mg total) by mouth daily. 10/17/23   Henderly, Britni A, PA-C  fluticasone  (FLONASE ) 50 MCG/ACT nasal spray Place 2 sprays into both nostrils daily as needed for allergies.    [provider]  folic acid  (FOLVITE ) 1 MG tablet Take 1 tablet (1 mg total) by mouth daily. Patient not taking: Reported on 09/18/2023 08/21/23   Raenelle Donalda HERO, MD  lidocaine  (XYLOCAINE ) 2 % solution Use as directed 15 mLs in the mouth or throat every 6 (six) hours as needed for mouth pain. 11/17/23   Long, Fonda MATSU, MD  lipase/protease/amylase (CREON ) 36000 UNITS CPEP capsule Take 2 capsules (72,000 Units total) by mouth 3 (three) times daily with meals. May also take 1 capsule (36,000 Units total) as needed (with snacks). 08/21/23  Ghimire, Donalda HERO, MD  loperamide  (IMODIUM ) 2 MG capsule Take 2 capsules (4 mg total) by mouth as needed for diarrhea or loose stools. 09/25/23   Mesner, Selinda, MD  magnesium  oxide (MAG-OX) 400 (240 Mg) MG tablet Take 1 tablet (400 mg total) by mouth daily. 11/07/23   Hoy Nidia FALCON, PA-C  Multiple Vitamin (MULTIVITAMIN WITH MINERALS) TABS tablet Take 1 tablet by mouth daily. 08/21/23   Ghimire, Donalda HERO, MD  nicotine  (NICODERM CQ  - DOSED IN MG/24 HOURS) 21 mg/24hr patch Place 1 patch (21 mg total) onto the skin daily. 11/14/23   Amin, Ankit C, MD  ondansetron  (ZOFRAN -ODT) 4 MG disintegrating tablet Take 1 tablet (4 mg total) by mouth every 8 (eight) hours as needed for nausea or  vomiting. 11/13/23   Amin, Ankit C, MD  Oxycodone  HCl 10 MG TABS Take 1 tablet (10 mg total) by mouth every 6 (six) hours as needed. 11/17/23   Long, Joshua G, MD  pantoprazole  (PROTONIX ) 20 MG tablet Take 1 tablet (20 mg total) by mouth daily before breakfast. 11/13/23   Amin, Ankit C, MD  polyethylene glycol powder (GLYCOLAX /MIRALAX ) 17 GM/SCOOP powder Take 17 g by mouth daily as needed for mild constipation. 11/13/23   Amin, Ankit C, MD  psyllium (HYDROCIL/METAMUCIL) 95 % PACK Take 1 packet by mouth daily. Patient not taking: Reported on 09/18/2023 08/21/23   Raenelle Donalda HERO, MD  sucralfate  (CARAFATE ) 1 GM/10ML suspension Take 10 mLs (1 g total) by mouth 4 (four) times daily -  with meals and at bedtime. 11/17/23   Long, Fonda MATSU, MD  tamsulosin  (FLOMAX ) 0.4 MG CAPS capsule Take 1 capsule (0.4 mg total) by mouth daily. 08/21/23   Ghimire, Donalda HERO, MD  thiamine  (VITAMIN B1) 100 MG tablet Take 1 tablet (100 mg total) by mouth daily. 08/21/23   Ghimire, Donalda HERO, MD  amitriptyline  (ELAVIL ) 25 MG tablet Take 1 tablet (25 mg total) by mouth at bedtime. Patient not taking: Reported on 08/08/2019 10/15/18 08/08/19  Tobie Yetta HERO, MD      Allergies    Robaxin  [methocarbamol ], Aspirin , Shellfish-derived products, Trazodone  and nefazodone, Adhesive [tape], Fish-derived products, Latex, Toradol  [ketorolac  tromethamine ], Contrast media [iodinated contrast media], Reglan  [metoclopramide ], and Garnell gums oil]    Review of Systems   Review of Systems A 10 point review of systems was performed and is negative unless otherwise reported in HPI.  Physical Exam Updated Vital Signs BP (!) 148/105   Pulse 82   Temp 98.1 F (36.7 C) (Oral)   Resp 18   SpO2 100%  Physical Exam General: Uncomfortable appearing male, lying in bed.  HEENT: PERRLA, Sclera anicteric, MMM, trachea midline.  Cardiology: RRR, no murmurs/rubs/gallops. BL radial and DP pulses equal bilaterally.  Resp: Normal respiratory rate and effort.  CTAB, no wheezes, rhonchi, crackles.  Abd: Soft, non-tender, non-distended. No rebound tenderness or guarding.  GU: Deferred. MSK: No peripheral edema or signs of trauma. Extremities without deformity or TTP. No cyanosis or clubbing. Skin: warm, dry. No rashes or lesions. Back: No CVA tenderness Neuro: A&Ox4, CNs II-XII grossly intact. MAEs. Sensation grossly intact.  Psych: Normal mood and affect.   ED Results / Procedures / Treatments   Labs (all labs ordered are listed, but only abnormal results are displayed) Labs Reviewed  CBC WITH DIFFERENTIAL/PLATELET  COMPREHENSIVE METABOLIC PANEL WITH GFR  LIPASE, BLOOD  ETHANOL  RAPID URINE DRUG SCREEN, HOSP PERFORMED  TROPONIN I (HIGH SENSITIVITY)  TROPONIN I (HIGH SENSITIVITY)    EKG  EKG Interpretation Date/Time:  Tuesday November 21 2023 17:00:46 EDT Ventricular Rate:  88 PR Interval:  99 QRS Duration:  86 QT Interval:  328 QTC Calculation: 397 R Axis:   75  Text Interpretation: Sinus arrhythmia Short PR interval Biatrial enlargement Borderline repolarization abnormality No significant change since last tracing Confirmed by Towana Sharper [phone]) on 11/22/2023 11:48:23 AM  Radiology DG Chest 2 View Result Date: 11/21/2023 CLINICAL DATA:  cp EXAM: CHEST - 2 VIEW COMPARISON:  Chest x-ray 11/17/2023 FINDINGS: Wireless device overlies the left chest. The heart and mediastinal contours are within normal limits. No focal consolidation. No pulmonary edema. No pleural effusion. No pneumothorax. No acute osseous abnormality. IMPRESSION: No active cardiopulmonary disease. Electronically Signed   By: Morgane  Naveau M.D.   On: 11/21/2023 14:01    Procedures Procedures    Medications Ordered in ED Medications  morphine  (PF) 4 MG/ML injection 4 mg (4 mg Intravenous Given 11/21/23 1731)  methylPREDNISolone  sodium succinate (SOLU-MEDROL ) 40 mg/mL injection 40 mg (40 mg Intravenous Given 11/21/23 1732)  diphenhydrAMINE  (BENADRYL ) capsule 50 mg (  Oral See Alternative 11/21/23 2034)    Or  diphenhydrAMINE  (BENADRYL ) injection 50 mg (50 mg Intravenous Given 11/21/23 2034)  ondansetron  (ZOFRAN ) injection 4 mg (4 mg Intravenous Given 11/21/23 1730)  morphine  (PF) 4 MG/ML injection 4 mg (4 mg Intravenous Given 11/21/23 2016)  iohexol  (OMNIPAQUE ) 350 MG/ML injection 100 mL (100 mLs Intravenous Contrast Given 11/21/23 2204)  LORazepam  (ATIVAN ) tablet 1 mg (1 mg Oral Given 11/21/23 2324)  morphine  (PF) 4 MG/ML injection 4 mg (4 mg Intravenous Given 11/21/23 2324)    ED Course/ Medical Decision Making/ A&P                          Medical Decision Making Amount and/or Complexity of Data Reviewed Labs: ordered. Decision-making details documented in ED Course. Radiology: ordered. Decision-making details documented in ED Course.  Risk Prescription drug management. Decision regarding hospitalization.    This patient presents to the ED for concern of abdpain/chest pain, this involves an extensive number of treatment options, and is a complaint that carries with it a high risk of complications and morbidity.  I considered the following differential and admission for this acute, potentially life threatening condition.   MDM:    Patient presents w/ chest pain and abdominal pain, similar sxs fo which he has presented to ED many times. He has h/o cocaine abuse and reports last use 2 days ago. Consider ACS/cocaine chest pain/dissection. He did have episode of syncope recently causing him to break arm for which he is now in a splint on LUE, and he does have contrast allergy and hasn't received contrasted scan to r/o AAS. Gave contrast prep to obtain CTA. EKG w/o acute changes, no STEMI, and trop neg x2. His dimer is neg for PE. CT with no other acute changes. Giving pain control, antiemetics.  Clinical Course as of 11/28/23 1131  Tue Nov 21, 2023  1616 DG Chest 2 View No active cardiopulmonary disease. [HN]  1702 Reports same symptoms he he had when he  broke his arm and fell. Dizziness, SOB trying to walk up stairs to go to bathroom at home. +N/V as well, unable to hold anything down. +diarrhea that started last night. Endorses central CP and R abdominal pain that started when he woke up this AM. Also endorsed HA this AM. Reports he was admitted for similar sxs last week. Has appt w/ cardiologist  on 12/08/23. Has cardiac monitor on now. CP rated 8/10. Coming/going. Getting up and trying to walk made it worse. No cough, f/c, leg swelling.  [HN]  2002 D-Dimer, Quant: 0.40 neg [HN]  2150 COCAINE(!): POSITIVE [HN]  2238 CT Angio Chest/Abd/Pel for Dissection W and/or Wo Contrast 1. Normal caliber thoracic and abdominal aorta. No aneurysm or dissection. Moderate distal aortic and iliac artery calcifications 2. No CT findings for pulmonary embolism. 3. Underlying emphysematous changes and areas of pulmonary scarring. No acute pulmonary findings or worrisome pulmonary lesions. 4. Stable changes of chronic pancreatitis with unchanged pseudocysts in the pancreatic body/tail junction region. 5. No acute abdominal/pelvic findings, mass lesions or adenopathy.   [HN]  2238 Troponin I (High Sensitivity): 4 Neg x2 [HN]  2244 Patient reevaluated. Reassuring w/u. UDS +cocaine. Consider cocaine chest pain vs chronic pancreatitis. He states his last use of cocaine was Friday, so it has been several days now and he doesn't associate this episode of chest pain with cocaine use. Patient will be given ativan  and morphine  as well as fluid. Will consult for admission. [HN]  2305 Discussed with cardiology as well about possibly obtaining results from Zio patch.  Cardiology states that would have to be discussion in the a.m. involving calling the Zio patch company to request information. [HN]  2330 Admitted to Dr. Arthea. [HN]    Clinical Course User Index [HN] Franklyn Sid SAILOR, MD    Labs: I Ordered, and personally interpreted labs.  The pertinent results include:   those listed above  Imaging Studies ordered: I ordered imaging studies including CTA dissection I independently visualized and interpreted imaging. I agree with the radiologist interpretation  Additional history obtained from chart review.   Cardiac Monitoring: The patient was maintained on a cardiac monitor.  I personally viewed and interpreted the cardiac monitored which showed an underlying rhythm of: NSR  Reevaluation: After the interventions noted above, I reevaluated the patient and found that they have :improved  Social Determinants of Health: Lives independently  Disposition:  Admit to medicine  Co morbidities that complicate the patient evaluation  Past Medical History:  Diagnosis Date   Alcoholism /alcohol  abuse    Anemia    Anxiety    Arthritis    knees; arms; elbows (03/26/2015)   Asthma    Bipolar disorder (HCC)    Chronic bronchitis (HCC)    Chronic lower back pain    Chronic pancreatitis (HCC)    Cocaine abuse (HCC)    Depression    Family history of adverse reaction to anesthesia    Femoral condyle fracture (HCC) 03/08/2014   left medial/notes 03/09/2014   GERD (gastroesophageal reflux disease)    H/O hiatal hernia    H/O suicide attempt 10/2012   High cholesterol    History of blood transfusion 10/2012   when I tried to commit suicide   History of stomach ulcers    Hypertension    Marijuana abuse, continuous    Migraine    a few times/year (03/26/2015)   Pancreatitis    Pneumonia 1990's X 3   PTSD (post-traumatic stress disorder)    Seizures (HCC)    Sickle cell trait (HCC)    WPW (Wolff-Parkinson-White syndrome)    thelbert 03/06/2013     Medicines No orders of the defined types were placed in this encounter.   I have reviewed the patients home medicines and have made adjustments as needed  Problem List / ED Course: Problem List Items Addressed This Visit  Other   Epigastric pain   Other Visit Diagnoses       Atypical chest  pain    -  Primary     Cocaine use         Alcohol  use                       This note was created using dictation software, which may contain spelling or grammatical errors.    Franklyn Sid SAILOR, MD 11/28/23 [phone]

## 2023-11-21 NOTE — ED Triage Notes (Signed)
 Pt from home with reports of CP and n/v. Pt received 100mcg fentanyl  en route and 324 ASA. Hx of WPW.

## 2023-11-21 NOTE — H&P (Signed)
 History and Physical    Patient: [name] FMW:[id number] DOB: Mar 05, 1971 DOA: 11/21/2023 DOS: the patient was seen and examined on 11/21/2023 PCP: Celestia Rosaline SQUIBB, NP  Patient coming from: Home  Chief Complaint:  Chief Complaint  Patient presents with   Chest Pain   HPI: [name] is a 53 y.o. male with medical history significant for cocaine abuse, alcohol  abuse, chronic pancreatitis, WPW syndrome status post ablation, and paroxysmal atrial fibrillation. This is his 18th ED visit in 3 months usually for chest and or abdominal pain. One week ago he was discharged after being treated for a syncopal episode.  He sustained a arm fracture with his syncope.  He was discharged with a ZOLL patch.  Today the patient comes in because of chest pain and abdominal pain.  He does not have coronary artery disease just the arrhythmias.  Both pain started this morning. He says he also has had diarrhea and has vomited 4 times.  The 2 pains are separate the chest pain is just to the left of his sternum and the abdominal pain is lower close to his umbilicus also on the left. He says the abdominal pain feels like his pancreatitis pain.  In the emergency department the patient did have a CT of his chest and abdomen to rule out dissection.  His there is no dissection found.  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  Since the patient does have a ZOLL patch on cardiology was consulted but they recommend calling the ZOLL patch company in the morning to have it integrity interrogated.  That was not something they could do overnight.  They also felt that there was apparent cardiac etiology to any of his symptoms at this time.   Review of Systems: As mentioned in the history of present illness. All other systems reviewed and are negative. Past Medical History:  Diagnosis Date   Alcoholism /alcohol  abuse    Anemia    Anxiety    Arthritis    knees; arms; elbows  (03/26/2015)   Asthma    Bipolar disorder (HCC)    Chronic bronchitis (HCC)    Chronic lower back pain    Chronic pancreatitis (HCC)    Cocaine abuse (HCC)    Depression    Family history of adverse reaction to anesthesia    Femoral condyle fracture (HCC) 03/08/2014   left medial/notes 03/09/2014   GERD (gastroesophageal reflux disease)    H/O hiatal hernia    H/O suicide attempt 10/2012   High cholesterol    History of blood transfusion 10/2012   when I tried to commit suicide   History of stomach ulcers    Hypertension    Marijuana abuse, continuous    Migraine    a few times/year (03/26/2015)   Pancreatitis    Pneumonia 1990's X 3   PTSD (post-traumatic stress disorder)    Seizures (HCC)    Sickle cell trait (HCC)    WPW (Wolff-Parkinson-White syndrome)    thelbert 03/06/2013   Past Surgical History:  Procedure Laterality Date   BIOPSY  11/25/2017   Procedure: BIOPSY;  Surgeon: Burnette Fallow, MD;  Location: [facility]COPY;  Service: Endoscopy;;   BIOPSY  10/14/2018   Procedure: BIOPSY;  Surgeon: Burnette Fallow, MD;  Location: [facility];  Service: Endoscopy;;   BIOPSY  03/06/2023   Procedure: BIOPSY;  Surgeon: Wilhelmenia Aloha Raddle., MD;  Location: [facility]COPY;  Service: Gastroenterology;;   CARDIAC CATHETERIZATION  CYST ENTEROSTOMY  01/02/2020   Procedure: CYST ASPIRATION;  Surgeon: Teressa Toribio SQUIBB, MD;  Location: [facility];  Service: Endoscopy;;   ESOPHAGOGASTRODUODENOSCOPY N/A 03/06/2023   Procedure: ESOPHAGOGASTRODUODENOSCOPY (EGD);  Surgeon: Wilhelmenia Aloha Raddle., MD;  Location: [facility];  Service: Gastroenterology;  Laterality: N/A;   ESOPHAGOGASTRODUODENOSCOPY (EGD) WITH PROPOFOL  N/A 11/25/2017   Procedure: ESOPHAGOGASTRODUODENOSCOPY (EGD) WITH PROPOFOL ;  Surgeon: Burnette Fallow, MD;  Location: [facility] ENDOSCOPY;  Service: Endoscopy;  Laterality: N/A;   ESOPHAGOGASTRODUODENOSCOPY (EGD) WITH PROPOFOL  Left 10/14/2018   Procedure: ESOPHAGOGASTRODUODENOSCOPY  (EGD) WITH PROPOFOL ;  Surgeon: Burnette Fallow, MD;  Location[facility] ENDOSCOPY;  Service: Endoscopy;  Laterality: Left;   ESOPHAGOGASTRODUODENOSCOPY (EGD) WITH PROPOFOL  N/A 11/14/2018   Procedure: ESOPHAGOGASTRODUODENOSCOPY (EGD) WITH PROPOFOL ;  Surgeon: Celestia Agent, MD;  Location: [facility]OS[facility];  Service: Gastroenterology;  Laterality: N/A;   ESOPHAGOGASTRODUODENOSCOPY (EGD) WITH PROPOFOL  N/A 01/02/2020   Procedure: ESOPHAGOGASTRODUODENOSCOPY (EGD) WITH PROPOFOL ;  Surgeon: Teressa Toribio SQUIBB, MD;  Location: [facility];  Service: Endoscopy;  Laterality: N/A;   ESOPHAGOGASTRODUODENOSCOPY (EGD) WITH PROPOFOL  N/A 10/25/2020   Procedure: ESOPHAGOGASTRODUODENOSCOPY (EGD) WITH PROPOFOL ;  Surgeon: Wilhelmenia Aloha Raddle., MD;  Location: [facility] ENDOSCOPY;  Service: Gastroenterology;  Laterality: N/A;   EUS N/A 01/02/2020   Procedure: UPPER ENDOSCOPIC ULTRASOUND (EUS) RADIAL;  Surgeon: Teressa Toribio SQUIBB, MD;  Location: [facility];  Service: Endoscopy;  Laterality: N/A;   EUS N/A 03/06/2023   Procedure: UPPER ENDOSCOPIC ULTRASOUND (EUS) RADIAL;  Surgeon: Wilhelmenia Aloha Raddle., MD;  Location: [facility];  Service: Gastroenterology;  Laterality: N/A;   EYE SURGERY Left 1990's   result of trauma    FACIAL FRACTURE SURGERY Left 1990's   result of trauma    FINE NEEDLE ASPIRATION N/A 03/06/2023   Procedure: FINE NEEDLE ASPIRATION (FNA) LINEAR;  Surgeon: Wilhelmenia Aloha Raddle., MD;  Location: [facility]OS[facility];  Service: Gastroenterology;  Laterality: N/A;   FLEXIBLE SIGMOIDOSCOPY N/A 10/25/2020   Procedure: FLEXIBLE SIGMOIDOSCOPY;  Surgeon: Wilhelmenia Aloha Raddle., MD;  Location[facility] END[facility]COPY;  Service: Gastroenterology;  Laterality: N/A;   FRACTURE SURGERY     HEMOSTASIS CLIP PLACEMENT  10/25/2020   Procedure: HEMOSTASIS CLIP PLACEMENT;  Surgeon: Wilhelmenia Aloha Raddle., MD;  Location: [facility] ENDOSCOPY;  Service: Gastroenterology;;   HERNIA REPAIR     HOT HEMOSTASIS N/A 03/06/2023   Procedure: HOT HEMOSTASIS (ARGON  PLASMA COAGULATION/BICAP);  Surgeon: Wilhelmenia Aloha Raddle., MD;  Location: [facility];  Service: Gastroenterology;  Laterality: N/A;   LEFT HEART CATHETERIZATION WITH CORONARY ANGIOGRAM Right 03/07/2013   Procedure: LEFT HEART CATHETERIZATION WITH CORONARY ANGIOGRAM;  Surgeon: Salena GORMAN Negri, MD;  Location: [facility] CATH LAB;  Service: Cardiovascular;  Laterality: Right;   UMBILICAL HERNIA REPAIR     UPPER GASTROINTESTINAL ENDOSCOPY     Social History:  reports that he has been smoking cigarettes and e-cigarettes. He has a 36 pack-year smoking history. He has never used smokeless tobacco. He reports that he does not currently use alcohol  after a past usage of about 4.0 standard drinks of alcohol  per week. He reports current drug use. Drugs: Marijuana and Cocaine.  Allergies  Allergen Reactions   Robaxin  [Methocarbamol ] Other (See Comments)    jumpy limbs   Aspirin  Other (See Comments)    Unknown reaction   Shellfish-Derived Products Nausea And Vomiting and Rash   Trazodone  And Nefazodone Other (See Comments)    Muscle spasms   Adhesive [Tape] Itching   Fish-Derived Products    Latex Itching   Toradol  [Ketorolac  Tromethamine ] Other (See Comments)    Has ulcers; cannot have this  Contrast Media [Iodinated Contrast Media] Hives   Reglan  [Metoclopramide ] Other (See Comments)    Muscle spasms   Salmon [Fish Oil] Nausea And Vomiting and Rash    Family History  Problem Relation Age of Onset   Hypertension Mother    Cirrhosis Father    Alcoholism Father    Hypertension Father    Melanoma Father    Hypertension Other    Coronary artery disease Other     Prior to Admission medications   Medication Sig Start Date End Date Taking? Authorizing Provider  acetaminophen  (TYLENOL ) 500 MG tablet Take 1,000 mg by mouth daily as needed for mild pain (pain score 1-3) or moderate pain (pain score 4-6).    [provider]  calcium -vitamin D  (OSCAL WITH D) 500-5 MG-MCG tablet Take 1 tablet  by mouth daily with breakfast. 09/18/23   Yolande Lamar BROCKS, MD  carvedilol  (COREG ) 25 MG tablet Take 1 tablet (25 mg total) by mouth 2 (two) times daily with a meal. 11/13/23   Amin, Ankit C, MD  dicyclomine  (BENTYL ) 20 MG tablet Take 1 tablet (20 mg total) by mouth daily as needed for spasms. Patient not taking: Reported on 08/26/2023 08/21/23   Raenelle Donalda HERO, MD  famotidine  (PEPCID ) 40 MG tablet Take 1 tablet (40 mg total) by mouth daily. 10/17/23   Henderly, Britni A, PA-C  fluticasone  (FLONASE ) 50 MCG/ACT nasal spray Place 2 sprays into both nostrils daily as needed for allergies.    [provider]  folic acid  (FOLVITE ) 1 MG tablet Take 1 tablet (1 mg total) by mouth daily. Patient not taking: Reported on 09/18/2023 08/21/23   Raenelle Donalda HERO, MD  lidocaine  (XYLOCAINE ) 2 % solution Use as directed 15 mLs in the mouth or throat every 6 (six) hours as needed for mouth pain. 11/17/23   Long, Fonda MATSU, MD  lipase/protease/amylase (CREON ) 36000 UNITS CPEP capsule Take 2 capsules (72,000 Units total) by mouth 3 (three) times daily with meals. May also take 1 capsule (36,000 Units total) as needed (with snacks). 08/21/23   Ghimire, Donalda HERO, MD  loperamide  (IMODIUM ) 2 MG capsule Take 2 capsules (4 mg total) by mouth as needed for diarrhea or loose stools. 09/25/23   Mesner, Selinda, MD  magnesium  oxide (MAG-OX) 400 (240 Mg) MG tablet Take 1 tablet (400 mg total) by mouth daily. 11/07/23   Hoy Nidia FALCON, PA-C  Multiple Vitamin (MULTIVITAMIN WITH MINERALS) TABS tablet Take 1 tablet by mouth daily. 08/21/23   Ghimire, Donalda HERO, MD  nicotine  (NICODERM CQ  - DOSED IN MG/24 HOURS) 21 mg/24hr patch Place 1 patch (21 mg total) onto the skin daily. 11/14/23   Amin, Ankit C, MD  ondansetron  (ZOFRAN -ODT) 4 MG disintegrating tablet Take 1 tablet (4 mg total) by mouth every 8 (eight) hours as needed for nausea or vomiting. 11/13/23   Amin, Ankit C, MD  Oxycodone  HCl 10 MG TABS Take 1 tablet (10 mg total) by  mouth every 6 (six) hours as needed. 11/17/23   Long, Fonda MATSU, MD  pantoprazole  (PROTONIX ) 20 MG tablet Take 1 tablet (20 mg total) by mouth daily before breakfast. 11/13/23   Amin, Ankit C, MD  polyethylene glycol powder (GLYCOLAX /MIRALAX ) 17 GM/SCOOP powder Take 17 g by mouth daily as needed for mild constipation. 11/13/23   Amin, Ankit C, MD  psyllium (HYDROCIL/METAMUCIL) 95 % PACK Take 1 packet by mouth daily. Patient not taking: Reported on 09/18/2023 08/21/23   Raenelle Donalda HERO, MD  sucralfate  (CARAFATE ) 1 GM/10ML  suspension Take 10 mLs (1 g total) by mouth 4 (four) times daily -  with meals and at bedtime. 11/17/23   Long, Fonda MATSU, MD  tamsulosin  (FLOMAX ) 0.4 MG CAPS capsule Take 1 capsule (0.4 mg total) by mouth daily. 08/21/23   Ghimire, Donalda HERO, MD  thiamine  (VITAMIN B1) 100 MG tablet Take 1 tablet (100 mg total) by mouth daily. 08/21/23   Ghimire, Donalda HERO, MD  amitriptyline  (ELAVIL ) 25 MG tablet Take 1 tablet (25 mg total) by mouth at bedtime. Patient not taking: Reported on 08/08/2019 10/15/18 08/08/19  Tobie Yetta HERO, MD    Physical Exam: Vitals:   11/21/23 2208 11/21/23 2230 11/21/23 2300 11/21/23 2315  BP:  (!) 145/95 (!) 135/93 (!) 162/89  Pulse:  97 97 91  Resp:  20 16 16   Temp: 97.8 F (36.6 C)     TempSrc: Oral     SpO2:  98% 98% 100%   Physical Exam:  General: No acute distress, well developed, thin HEENT: Normocephalic, atraumatic, PERRL Cardiovascular: Normal rate and rhythm. Distal pulses intact. Pulmonary: Normal pulmonary effort, normal breath sounds Gastrointestinal: Nondistended abdomen, soft, mild tenderness, normoactive bowel sounds Able to sit forward and lay back without sig pain Musculoskeletal:Normal ROM, no lower ext edema Lymphadenopathy: No cervical LAD. Skin: Skin is warm and dry. Neuro: No focal deficits noted, AAOx3. PSYCH: Attentive and cooperative  Data Reviewed:  Results for orders placed or performed during the hospital encounter of 11/21/23  (from the past 24 hours)  Urine rapid drug screen (hosp performed)     Status: Abnormal   Collection Time: 11/21/23 11:53 AM  Result Value Ref Range   Opiates NONE DETECTED NONE DETECTED   Cocaine POSITIVE (A) NONE DETECTED   Benzodiazepines NONE DETECTED NONE DETECTED   Amphetamines NONE DETECTED NONE DETECTED   Tetrahydrocannabinol NONE DETECTED NONE DETECTED   Barbiturates NONE DETECTED NONE DETECTED  CBC with Differential     Status: Abnormal   Collection Time: 11/21/23  6:39 PM  Result Value Ref Range   WBC 7.0 4.0 - 10.5 K/uL   RBC 3.60 (L) 4.22 - 5.81 MIL/uL   Hemoglobin 10.9 (L) 13.0 - 17.0 g/dL   HCT 66.8 (L) 60.9 - 47.9 %   MCV 91.9 80.0 - 100.0 fL   MCH 30.3 26.0 - 34.0 pg   MCHC 32.9 30.0 - 36.0 g/dL   RDW 84.0 (H) 88.4 - 84.4 %   Platelets 217 150 - 400 K/uL   nRBC 0.0 0.0 - 0.2 %   Neutrophils Relative % 48 %   Neutro Abs 3.4 1.7 - 7.7 K/uL   Lymphocytes Relative 39 %   Lymphs Abs 2.7 0.7 - 4.0 K/uL   Monocytes Relative 10 %   Monocytes Absolute 0.7 0.1 - 1.0 K/uL   Eosinophils Relative 2 %   Eosinophils Absolute 0.2 0.0 - 0.5 K/uL   Basophils Relative 0 %   Basophils Absolute 0.0 0.0 - 0.1 K/uL   Immature Granulocytes 1 %   Abs Immature Granulocytes 0.05 0.00 - 0.07 K/uL  Troponin I (High Sensitivity)     Status: None   Collection Time: 11/21/23  6:39 PM  Result Value Ref Range   Troponin I (High Sensitivity) 5 <18 ng/L  Comprehensive metabolic panel     Status: Abnormal   Collection Time: 11/21/23  6:39 PM  Result Value Ref Range   Sodium 139 135 - 145 mmol/L   Potassium 3.8 3.5 - 5.1 mmol/L   Chloride 106  98 - 111 mmol/L   CO2 19 (L) 22 - 32 mmol/L   Glucose, Bld 74 70 - 99 mg/dL   BUN 6 6 - 20 mg/dL   Creatinine, Ser 9.13 0.61 - 1.24 mg/dL   Calcium  7.6 (L) 8.9 - 10.3 mg/dL   Total Protein 7.4 6.5 - 8.1 g/dL   Albumin  3.9 3.5 - 5.0 g/dL   AST 31 15 - 41 U/L   ALT 26 0 - 44 U/L   Alkaline Phosphatase 73 38 - 126 U/L   Total Bilirubin 0.6 0.0 -  1.2 mg/dL   GFR, Estimated >39 >39 mL/min   Anion gap 14 5 - 15  Lipase, blood     Status: None   Collection Time: 11/21/23  6:39 PM  Result Value Ref Range   Lipase 24 11 - 51 U/L  Ethanol     Status: Abnormal   Collection Time: 11/21/23  6:39 PM  Result Value Ref Range   Alcohol , Ethyl (B) 22 (H) <15 mg/dL  D-dimer, quantitative     Status: None   Collection Time: 11/21/23  6:39 PM  Result Value Ref Range   D-Dimer, Quant 0.40 0.00 - 0.50 ug/mL-FEU  Troponin I (High Sensitivity)     Status: None   Collection Time: 11/21/23  8:38 PM  Result Value Ref Range   Troponin I (High Sensitivity) 4 <18 ng/L     Assessment and Plan: 1.Chest pain 2.H/o WPW 3.Cocaine abuse 4.abdominal pain /Chronic pancreatitis with chronic pseudocysts - NPO - IV fluids, pain control - Continue pancreatic enzymes - The patient requests GI cocktail - CIWA protocol - The patient's troponins have been negative. The cause of his chest pain is not clear but it may be gastritis given his relief with the GI cocktail - cardiac monitor - He has a ZOLL patch which can be interrogated if there is concern that he had an arrhythmia prior to admission - Alcohol  and substance abuse counseling needed - The patient requests the number to the pain clinic.  He needs to reschedule his outpatient appointment.   Advance Care Planning:   Code Status: Prior the patient names his mother as a Runner, broadcasting/film/video and he wants to be full code  Consults: None  Family Communication: None  Severity of Illness: The appropriate patient status for this patient is INPATIENT. Inpatient status is judged to be reasonable and necessary in order to provide the required intensity of service to ensure the patient's safety. The patient's presenting symptoms, physical exam findings, and initial radiographic and laboratory data in the context of their chronic comorbidities is felt to place them at high risk for further clinical  deterioration. Furthermore, it is not anticipated that the patient will be medically stable for discharge from the hospital within 2 midnights of admission.   * I certify that at the point of admission it is my clinical judgment that the patient will require inpatient hospital care spanning beyond 2 midnights from the point of admission due to high intensity of service, high risk for further deterioration and high frequency of surveillance required.*  Author: ARTHEA CHILD, MD 11/21/2023 11:45 PM  For on call review [email or web address].

## 2023-11-21 NOTE — ED Notes (Signed)
 ED Provider at bedside.

## 2023-11-22 ENCOUNTER — Other Ambulatory Visit (HOSPITAL_COMMUNITY): Payer: Self-pay

## 2023-11-22 DIAGNOSIS — Z79899 Other long term (current) drug therapy: Secondary | ICD-10-CM | POA: Diagnosis not present

## 2023-11-22 DIAGNOSIS — F1410 Cocaine abuse, uncomplicated: Secondary | ICD-10-CM | POA: Diagnosis present

## 2023-11-22 DIAGNOSIS — J4489 Other specified chronic obstructive pulmonary disease: Secondary | ICD-10-CM | POA: Diagnosis present

## 2023-11-22 DIAGNOSIS — N400 Enlarged prostate without lower urinary tract symptoms: Secondary | ICD-10-CM | POA: Diagnosis present

## 2023-11-22 DIAGNOSIS — I480 Paroxysmal atrial fibrillation: Secondary | ICD-10-CM | POA: Diagnosis present

## 2023-11-22 DIAGNOSIS — R1013 Epigastric pain: Secondary | ICD-10-CM

## 2023-11-22 DIAGNOSIS — F319 Bipolar disorder, unspecified: Secondary | ICD-10-CM | POA: Diagnosis present

## 2023-11-22 DIAGNOSIS — I161 Hypertensive emergency: Secondary | ICD-10-CM | POA: Diagnosis present

## 2023-11-22 DIAGNOSIS — F1020 Alcohol dependence, uncomplicated: Secondary | ICD-10-CM | POA: Diagnosis present

## 2023-11-22 DIAGNOSIS — F17210 Nicotine dependence, cigarettes, uncomplicated: Secondary | ICD-10-CM | POA: Diagnosis present

## 2023-11-22 DIAGNOSIS — D529 Folate deficiency anemia, unspecified: Secondary | ICD-10-CM | POA: Diagnosis present

## 2023-11-22 DIAGNOSIS — K861 Other chronic pancreatitis: Secondary | ICD-10-CM | POA: Diagnosis present

## 2023-11-22 DIAGNOSIS — E872 Acidosis, unspecified: Secondary | ICD-10-CM | POA: Diagnosis present

## 2023-11-22 DIAGNOSIS — I456 Pre-excitation syndrome: Secondary | ICD-10-CM | POA: Diagnosis not present

## 2023-11-22 DIAGNOSIS — Z91041 Radiographic dye allergy status: Secondary | ICD-10-CM | POA: Diagnosis not present

## 2023-11-22 DIAGNOSIS — D573 Sickle-cell trait: Secondary | ICD-10-CM | POA: Diagnosis present

## 2023-11-22 DIAGNOSIS — I10 Essential (primary) hypertension: Secondary | ICD-10-CM | POA: Diagnosis present

## 2023-11-22 DIAGNOSIS — Z6372 Alcoholism and drug addiction in family: Secondary | ICD-10-CM | POA: Diagnosis not present

## 2023-11-22 DIAGNOSIS — K863 Pseudocyst of pancreas: Secondary | ICD-10-CM | POA: Diagnosis present

## 2023-11-22 DIAGNOSIS — E7800 Pure hypercholesterolemia, unspecified: Secondary | ICD-10-CM | POA: Diagnosis present

## 2023-11-22 DIAGNOSIS — R109 Unspecified abdominal pain: Secondary | ICD-10-CM | POA: Diagnosis present

## 2023-11-22 DIAGNOSIS — D638 Anemia in other chronic diseases classified elsewhere: Secondary | ICD-10-CM | POA: Diagnosis present

## 2023-11-22 DIAGNOSIS — Z720 Tobacco use: Secondary | ICD-10-CM | POA: Diagnosis not present

## 2023-11-22 DIAGNOSIS — K2920 Alcoholic gastritis without bleeding: Secondary | ICD-10-CM | POA: Diagnosis present

## 2023-11-22 DIAGNOSIS — K219 Gastro-esophageal reflux disease without esophagitis: Secondary | ICD-10-CM | POA: Diagnosis present

## 2023-11-22 DIAGNOSIS — F1010 Alcohol abuse, uncomplicated: Secondary | ICD-10-CM | POA: Diagnosis not present

## 2023-11-22 DIAGNOSIS — Z8249 Family history of ischemic heart disease and other diseases of the circulatory system: Secondary | ICD-10-CM | POA: Diagnosis not present

## 2023-11-22 LAB — BASIC METABOLIC PANEL WITH GFR
Anion gap: 12 (ref 5–15)
BUN: 9 mg/dL (ref 6–20)
CO2: 18 mmol/L — ABNORMAL LOW (ref 22–32)
Calcium: 6.9 mg/dL — ABNORMAL LOW (ref 8.9–10.3)
Chloride: 106 mmol/L (ref 98–111)
Creatinine, Ser: 1.04 mg/dL (ref 0.61–1.24)
GFR, Estimated: >60 mL/min (ref >60)
Glucose, Bld: 129 mg/dL — ABNORMAL HIGH (ref 70–99)
Potassium: 3.7 mmol/L (ref 3.5–5.1)
Sodium: 136 mmol/L (ref 135–145)

## 2023-11-22 LAB — COMPREHENSIVE METABOLIC PANEL WITH GFR
ALT: 19 U/L (ref 0–44)
AST: 38 U/L (ref 15–41)
Albumin: 2.5 g/dL — ABNORMAL LOW (ref 3.5–5.0)
Alkaline Phosphatase: 49 U/L (ref 38–126)
Anion gap: 12 (ref 5–15)
BUN: 7 mg/dL (ref 6–20)
CO2: 17 mmol/L — ABNORMAL LOW (ref 22–32)
Calcium: 5.6 mg/dL — CL (ref 8.9–10.3)
Chloride: 110 mmol/L (ref 98–111)
Creatinine, Ser: 0.72 mg/dL (ref 0.61–1.24)
GFR, Estimated: >60 mL/min (ref >60)
Glucose, Bld: 135 mg/dL — ABNORMAL HIGH (ref 70–99)
Potassium: 4.2 mmol/L (ref 3.5–5.1)
Sodium: 139 mmol/L (ref 135–145)
Total Bilirubin: 0.8 mg/dL (ref 0.0–1.2)
Total Protein: 5 g/dL — ABNORMAL LOW (ref 6.5–8.1)

## 2023-11-22 LAB — CBC
HCT: 25.4 % — ABNORMAL LOW (ref 39.0–52.0)
Hemoglobin: 8.2 g/dL — ABNORMAL LOW (ref 13.0–17.0)
MCH: 30.7 pg (ref 26.0–34.0)
MCHC: 32.3 g/dL (ref 30.0–36.0)
MCV: 95.1 fL (ref 80.0–100.0)
Platelets: 168 K/uL (ref 150–400)
RBC: 2.67 MIL/uL — ABNORMAL LOW (ref 4.22–5.81)
RDW: 16 % — ABNORMAL HIGH (ref 11.5–15.5)
WBC: 7.4 K/uL (ref 4.0–10.5)
nRBC: 0 % (ref 0.0–0.2)

## 2023-11-22 LAB — MAGNESIUM
Magnesium: 0.6 mg/dL — CL (ref 1.7–2.4)
Magnesium: 0.7 mg/dL — CL (ref 1.7–2.4)

## 2023-11-22 LAB — FERRITIN: Ferritin: 20 ng/mL — ABNORMAL LOW (ref 24–336)

## 2023-11-22 LAB — IRON AND TIBC
Iron: 109 ug/dL (ref 45–182)
Saturation Ratios: 29 % (ref 17.9–39.5)
TIBC: 381 ug/dL (ref 250–450)
UIBC: 272 ug/dL

## 2023-11-22 LAB — RETIC PANEL
Immature Retic Fract: 17.8 % — ABNORMAL HIGH (ref 2.3–15.9)
RBC.: 2.71 MIL/uL — ABNORMAL LOW (ref 4.22–5.81)
Retic Count, Absolute: 47.4 K/uL (ref 19.0–186.0)
Retic Ct Pct: 1.8 % (ref 0.4–3.1)
Reticulocyte Hemoglobin: 28.2 pg (ref >27.9)

## 2023-11-22 LAB — VITAMIN B12: Vitamin B-12: 319 pg/mL (ref 180–914)

## 2023-11-22 LAB — LIPASE, BLOOD: Lipase: 22 U/L (ref 11–51)

## 2023-11-22 LAB — OCCULT BLOOD X 1 CARD TO LAB, STOOL: Fecal Occult Bld: NEGATIVE

## 2023-11-22 LAB — FOLATE: Folate: 16.6 ng/mL (ref >5.9)

## 2023-11-22 MED ORDER — FOLIC ACID 1 MG PO TABS
1.0000 mg | ORAL_TABLET | Freq: Every day | ORAL | Status: DC
  Administered 2023-11-22 – 2023-11-23 (×2): 1 mg via ORAL
  Filled 2023-11-22 (×2): qty 1

## 2023-11-22 MED ORDER — LORAZEPAM 2 MG/ML IJ SOLN
1.0000 mg | INTRAMUSCULAR | Status: DC | PRN
  Administered 2023-11-22 – 2023-11-23 (×2): 2 mg via INTRAVENOUS
  Filled 2023-11-22 (×3): qty 1

## 2023-11-22 MED ORDER — FAMOTIDINE 20 MG PO TABS: 40.0000 mg | ORAL_TABLET | Freq: Every day | ORAL | Status: DC

## 2023-11-22 MED ORDER — PANTOPRAZOLE SODIUM 20 MG PO TBEC
20.0000 mg | DELAYED_RELEASE_TABLET | Freq: Every day | ORAL | Status: DC
  Administered 2023-11-22 – 2023-11-23 (×2): 20 mg via ORAL
  Filled 2023-11-22 (×2): qty 1

## 2023-11-22 MED ORDER — ONDANSETRON HCL 4 MG/2ML IJ SOLN
4.0000 mg | Freq: Four times a day (QID) | INTRAMUSCULAR | Status: DC | PRN
  Administered 2023-11-22: 4 mg via INTRAVENOUS
  Filled 2023-11-22: qty 2

## 2023-11-22 MED ORDER — OXYCODONE HCL 5 MG PO TABS
5.0000 mg | ORAL_TABLET | ORAL | Status: DC | PRN
  Administered 2023-11-22 (×3): 5 mg via ORAL
  Filled 2023-11-22 (×3): qty 1

## 2023-11-22 MED ORDER — HYDRALAZINE HCL 20 MG/ML IJ SOLN
10.0000 mg | INTRAMUSCULAR | Status: DC | PRN
  Administered 2023-11-23: 10 mg via INTRAVENOUS
  Filled 2023-11-22 (×2): qty 1

## 2023-11-22 MED ORDER — HYDROMORPHONE HCL 1 MG/ML IJ SOLN
0.5000 mg | INTRAMUSCULAR | Status: DC | PRN
  Administered 2023-11-22 (×7): 0.5 mg via INTRAVENOUS
  Filled 2023-11-22 (×7): qty 1

## 2023-11-22 MED ORDER — PANCRELIPASE (LIP-PROT-AMYL) 36000-114000 UNITS PO CPEP
72000.0000 [IU] | ORAL_CAPSULE | Freq: Three times a day (TID) | ORAL | Status: DC
  Administered 2023-11-22 – 2023-11-23 (×3): 72000 [IU] via ORAL
  Filled 2023-11-22 (×5): qty 2

## 2023-11-22 MED ORDER — ALUM & MAG HYDROXIDE-SIMETH 200-200-20 MG/5ML PO SUSP
30.0000 mL | Freq: Four times a day (QID) | ORAL | Status: DC | PRN
  Administered 2023-11-22: 30 mL via ORAL
  Filled 2023-11-22: qty 30

## 2023-11-22 MED ORDER — PANCRELIPASE (LIP-PROT-AMYL) 36000-114000 UNITS PO CPEP: 36000.0000 [IU] | ORAL_CAPSULE | ORAL | Status: DC | PRN

## 2023-11-22 MED ORDER — LIDOCAINE VISCOUS HCL 2 % MT SOLN
15.0000 mL | Freq: Four times a day (QID) | OROMUCOSAL | Status: DC | PRN
  Administered 2023-11-22: 15 mL via OROMUCOSAL
  Filled 2023-11-22: qty 15

## 2023-11-22 MED ORDER — SORBITOL 70 % SOLN: 30.0000 mL | Freq: Every day | Status: DC | PRN

## 2023-11-22 MED ORDER — CALCIUM GLUCONATE-NACL 2-0.675 GM/100ML-% IV SOLN
2.0000 g | Freq: Once | INTRAVENOUS | Status: DC
  Filled 2023-11-22: qty 100

## 2023-11-22 MED ORDER — FAMOTIDINE 20 MG PO TABS
40.0000 mg | ORAL_TABLET | Freq: Every day | ORAL | Status: DC
  Administered 2023-11-22 – 2023-11-23 (×2): 40 mg via ORAL
  Filled 2023-11-22 (×2): qty 2

## 2023-11-22 MED ORDER — THIAMINE HCL 100 MG/ML IJ SOLN
100.0000 mg | Freq: Every day | INTRAMUSCULAR | Status: DC
  Administered 2023-11-22 – 2023-11-23 (×2): 100 mg via INTRAVENOUS
  Filled 2023-11-22 (×2): qty 2

## 2023-11-22 MED ORDER — MELATONIN 3 MG PO TABS: 3.0000 mg | ORAL_TABLET | Freq: Every evening | ORAL | Status: DC | PRN

## 2023-11-22 MED ORDER — MAGNESIUM SULFATE 4 GM/100ML IV SOLN
4.0000 g | Freq: Once | INTRAVENOUS | Status: DC
  Filled 2023-11-22: qty 100

## 2023-11-22 MED ORDER — ALUM & MAG HYDROXIDE-SIMETH 200-200-20 MG/5ML PO SUSP
30.0000 mL | Freq: Once | ORAL | Status: AC
  Administered 2023-11-22: 30 mL via ORAL
  Filled 2023-11-22: qty 30

## 2023-11-22 MED ORDER — CARVEDILOL 25 MG PO TABS
25.0000 mg | ORAL_TABLET | Freq: Two times a day (BID) | ORAL | Status: DC
  Administered 2023-11-22 – 2023-11-23 (×3): 25 mg via ORAL
  Filled 2023-11-22: qty 1
  Filled 2023-11-22: qty 2
  Filled 2023-11-22 (×2): qty 1

## 2023-11-22 MED ORDER — LABETALOL HCL 5 MG/ML IV SOLN: 10.0000 mg | INTRAVENOUS | Status: DC | PRN

## 2023-11-22 MED ORDER — MAGNESIUM SULFATE 4 GM/100ML IV SOLN
4.0000 g | Freq: Once | INTRAVENOUS | Status: AC
  Administered 2023-11-22: 4 g via INTRAVENOUS
  Filled 2023-11-22: qty 100

## 2023-11-22 MED ORDER — ADULT MULTIVITAMIN W/MINERALS CH
1.0000 | ORAL_TABLET | Freq: Every day | ORAL | Status: DC
  Administered 2023-11-22 – 2023-11-23 (×2): 1 via ORAL
  Filled 2023-11-22 (×2): qty 1

## 2023-11-22 MED ORDER — LABETALOL HCL 5 MG/ML IV SOLN
10.0000 mg | INTRAVENOUS | Status: DC | PRN
  Administered 2023-11-22: 10 mg via INTRAVENOUS
  Filled 2023-11-22: qty 4

## 2023-11-22 MED ORDER — SODIUM CHLORIDE 0.9 % IV SOLN: INTRAVENOUS | Status: AC

## 2023-11-22 MED ORDER — PANCRELIPASE (LIP-PROT-AMYL) 36000-114000 UNITS PO CPEP: 72000.0000 [IU] | ORAL_CAPSULE | Freq: Three times a day (TID) | ORAL | Status: DC

## 2023-11-22 MED ORDER — TAMSULOSIN HCL 0.4 MG PO CAPS
0.4000 mg | ORAL_CAPSULE | Freq: Every day | ORAL | Status: DC
  Administered 2023-11-22 – 2023-11-23 (×2): 0.4 mg via ORAL
  Filled 2023-11-22 (×2): qty 1

## 2023-11-22 MED ORDER — PANCRELIPASE (LIP-PROT-AMYL) 36000-114000 UNITS PO CPEP: 36000.0000 [IU] | ORAL_CAPSULE | Freq: Two times a day (BID) | ORAL | Status: DC | PRN

## 2023-11-22 MED ORDER — SUCRALFATE 1 GM/10ML PO SUSP
1.0000 g | Freq: Three times a day (TID) | ORAL | Status: DC
  Administered 2023-11-22 – 2023-11-23 (×4): 1 g via ORAL
  Filled 2023-11-22 (×4): qty 10

## 2023-11-22 MED ORDER — ONDANSETRON HCL 4 MG PO TABS: 4.0000 mg | ORAL_TABLET | Freq: Four times a day (QID) | ORAL | Status: DC | PRN

## 2023-11-22 MED ORDER — LACTATED RINGERS IV SOLN: INTRAVENOUS | Status: DC

## 2023-11-22 NOTE — Progress Notes (Signed)
 Hemoccult slide collected and sent to lab.

## 2023-11-22 NOTE — ED Notes (Signed)
 Patient requesting something for break through pain.  No PRN medication available at this time

## 2023-11-22 NOTE — ED Notes (Signed)
 [facility] called

## 2023-11-22 NOTE — ED Notes (Signed)
 Pt requesting medication for n

## 2023-11-22 NOTE — Progress Notes (Signed)
 PROGRESS NOTE    [name]  FMW:[id number] DOB: 1970-09-27 DOA: 11/21/2023 PCP: Celestia Rosaline SQUIBB, NP   Brief Narrative:  HPI: [name] is a 53 y.o. male with medical history significant for cocaine abuse, alcohol  abuse, chronic pancreatitis, WPW syndrome status post ablation, and paroxysmal atrial fibrillation. This is his 18th ED visit in 3 months usually for chest and or abdominal pain. One week ago he was discharged after being treated for a syncopal episode.  He sustained a arm fracture with his syncope.  He was discharged with a ZOLL patch.  Today the patient comes in because of chest pain and abdominal pain.  He does not have coronary artery disease just the arrhythmias.  Both pain started this morning. He says he also has had diarrhea and has vomited 4 times.  The 2 pains are separate the chest pain is just to the left of his sternum and the abdominal pain is lower close to his umbilicus also on the left. He says the abdominal pain feels like his pancreatitis pain.  In the emergency department the patient did have a CT of his chest and abdomen to rule out dissection.  His there is no dissection found.  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  Since the patient does have a ZOLL patch on cardiology was consulted but they recommend calling the ZOLL patch company in the morning to have it integrity interrogated.  That was not something they could do overnight.  They also felt that there was apparent cardiac etiology to any of his symptoms at this time.    Assessment & Plan:   Active Problems:   Abdominal pain  Abdominal pain/chest pain/history of WPW syndrome/cocaine abuse/chronic alcoholism: Patient with history of chronic pancreatitis and pseudocyst.  CT abdomen and pelvis did not show any acute pathology.  Cardiac enzymes negative.  No acute abnormalities on the EKG.  Patient continues to drink alcohol  3-4 times a day and usually drinks  3-4 beers when he drinks and continues to consume cocaine, last time he used was Friday.  Likely cause of his symptoms are gastritis due to alcoholism and possible chest pain secondary to cocaine abuse.  He used to take Bentyl  but does not take it anymore because it  does not make him feel well.  Did have vomiting last time but nausea this morning and no vomiting.  Has Zio patch since 2 weeks, we will interrogate that.  However I doubt arrhythmia.  GI cocktail helped in the ED suggesting that he likely has gastritis causing the abdominal pain.  Will repeat GI cocktail.  Resume Protonix .  No signs of withdrawal, continue CIWA protocol.  Hypertensive emergency: Systolic blood pressure as high as 162 and diastolic blood pressure 115.  Combined with the chest pain, meet criteria for hypertensive emergency.  Thankfully, troponins are negative.  Blood pressure improving.  Elevation could also be due to pain.  Resumed home dose of Coreg  25 mg p.o. twice daily.  Added as needed IV hydralazine .  Monitor for now.  Hypomagnesemia/hypocalcemia: Replenished both.  Recheck labs later today.  Acute on chronic anemia of chronic disease: Baseline hemoglobin appears to be between 10-12.  Came in with 10.9 yesterday which has dropped to 8.2 today.  Could be due to dehydration and he received fluids.  MCV normal.  Will check iron studies, B12, folate as well as FOBT to rule out GI bleed.  Recheck hemoglobin later today.  Mild metabolic acidosis:  CO2 17.  Will hydrate with normal saline.  BPH: Continue Flomax .  Substance abuse: He says that he is tired of living the way he is living, tired of being high.  Father says that he has 31 year old grandson and he wants to be sober for him.  He plans to go to rehabilitation after discharge.  He already has information about all the resources available in community.  DVT prophylaxis: SCDs Start: 11/22/23 0031   Code Status: Full Code  Family Communication:  None present at  bedside.  Plan of care discussed with patient in length and he/she verbalized understanding and agreed with it.  Status is: Inpatient Remains inpatient appropriate because: Electrolyte abnormalities, dropping hemoglobin, elevated blood pressure.   Estimated body mass index is 18.52 kg/m as calculated from the following:   Height as of 11/10/23: 5' 8 (1.727 m).   Weight as of 11/12/23: 55.2 kg.    Nutritional Assessment: There is no height or weight on file to calculate BMI.. Seen by dietician.  I agree with the assessment and plan as outlined below: Nutrition Status:        . Skin Assessment: I have examined the patient's skin and I agree with the wound assessment as performed by the wound care RN as outlined below:    Consultants:  None  Procedures:  None  Antimicrobials:  Anti-infectives (From admission, onward)    None         Subjective: Patient seen and examined, still complains of abdominal pain and chest pain, slightly better than yesterday.  Some nausea but no vomiting.  Vomited 3 times last night.  Denies any palpitations.  Objective: Vitals:   11/22/23 0519 11/22/23 0530 11/22/23 0545 11/22/23 0600  BP:  (!) 149/104 (!) 148/102   Pulse:    73  Resp:  19 16 20   Temp: 98.1 F (36.7 C)     TempSrc: Oral     SpO2:    100%    Intake/Output Summary (Last 24 hours) at 11/22/2023 0858 Last data filed at 11/22/2023 0829 Gross per 24 hour  Intake 1000 ml  Output 800 ml  Net 200 ml   There were no vitals filed for this visit.  Examination:  General exam: Appears calm and comfortable  Respiratory system: Clear to auscultation. Respiratory effort normal. Cardiovascular system: S1 & S2 heard, RRR. No JVD, murmurs, rubs, gallops or clicks. No pedal edema. Gastrointestinal system: Abdomen is nondistended, soft and epigastric tenderness as well as left anterior chest tenderness. No organomegaly or masses felt. Normal bowel sounds heard. Central nervous  system: Alert and oriented. No focal neurological deficits. Extremities: Symmetric 5 x 5 power. Skin: No rashes, lesions or ulcers Psychiatry: Judgement and insight appear normal. Mood & affect appropriate.    Data Reviewed: I have personally reviewed following labs and imaging studies  CBC: Recent Labs  Lab 11/17/23 0215 11/21/23 1839 11/22/23 0546  WBC 6.7 7.0 7.4  NEUTROABS 4.0 3.4  --   HGB 9.9* 10.9* 8.2*  HCT 30.8* 33.1* 25.4*  MCV 96.6 91.9 95.1  PLT 182 217 168   Basic Metabolic Panel: Recent Labs  Lab 11/17/23 0215 11/21/23 1839 11/22/23 0546  NA 139 139 139  K 3.6 3.8 4.2  CL 108 106 110  CO2 18* 19* 17*  GLUCOSE 88 74 135*  BUN 9 6 7   CREATININE 0.93 0.86 0.72  CALCIUM  7.8* 7.6* 5.6*  MG  --   --  0.6*   GFR: Estimated Creatinine Clearance:  82.4 mL/min (by C-G formula based on SCr of 0.72 mg/dL). Liver Function Tests: Recent Labs  Lab 11/17/23 0215 11/21/23 1839 11/22/23 0546  AST 27 31 38  ALT 20 26 19   ALKPHOS 64 73 49  BILITOT 0.6 0.6 0.8  PROT 6.8 7.4 5.0*  ALBUMIN  3.5 3.9 2.5*   Recent Labs  Lab 11/17/23 0215 11/21/23 1839 11/22/23 0546  LIPASE 21 24 22    No results for input(s): AMMONIA in the last 168 hours. Coagulation Profile: No results for input(s): INR, PROTIME in the last 168 hours. Cardiac Enzymes: No results for input(s): CKTOTAL, CKMB, CKMBINDEX, TROPONINI in the last 168 hours. BNP (last 3 results) No results for input(s): PROBNP in the last 8760 hours. HbA1C: No results for input(s): HGBA1C in the last 72 hours. CBG: No results for input(s): GLUCAP in the last 168 hours. Lipid Profile: No results for input(s): CHOL, HDL, LDLCALC, TRIG, CHOLHDL, LDLDIRECT in the last 72 hours. Thyroid  Function Tests: No results for input(s): TSH, T4TOTAL, FREET4, T3FREE, THYROIDAB in the last 72 hours. Anemia Panel: No results for input(s): VITAMINB12, FOLATE, FERRITIN, TIBC,  IRON, RETICCTPCT in the last 72 hours. Sepsis Labs: No results for input(s): PROCALCITON, LATICACIDVEN in the last 168 hours.  No results found for this or any previous visit (from the past 240 hours).   Radiology Studies: CT Angio Chest/Abd/Pel for Dissection W and/or Wo Contrast Result Date: 11/21/2023 CLINICAL DATA:  Chest and abdominal pain. Suspected acute aortic syndrome. Recent CT abdomen/pelvis 11/07/2023 EXAM: CT ANGIOGRAPHY CHEST, ABDOMEN AND PELVIS TECHNIQUE: Non-contrast CT of the chest was initially obtained. Multidetector CT imaging through the chest, abdomen and pelvis was performed using the standard protocol during bolus administration of intravenous contrast. Multiplanar reconstructed images and MIPs were obtained and reviewed to evaluate the vascular anatomy. RADIATION DOSE REDUCTION: This exam was performed according to the departmental dose-optimization program which includes automated exposure control, adjustment of the mA and/or kV according to patient size and/or use of iterative reconstruction technique. CONTRAST:  OMNIPAQUE  IOHEXOL  350 MG/ML SOLN COMPARISON:  CT abdomen/pelvis 11/07/2023 FINDINGS: CTA CHEST FINDINGS Cardiovascular: The heart is normal in size. No pericardial effusion. The aorta is normal in caliber. No dissection or atherosclerotic calcification. The branch vessels are patent. No coronary artery calcifications are noted. The pulmonary arteries are normal. No filling defects to suggest pulmonary emboli. Mediastinum/Nodes: No mediastinal or hilar mass or lymphadenopathy. Small scattered lymph nodes are noted. The esophagus is unremarkable. The thyroid  gland is normal. Lungs/Pleura: Underlying emphysematous changes and areas of pulmonary scarring. No infiltrates, edema or effusions. No worrisome pulmonary lesions or pulmonary nodules. No pneumothorax. Musculoskeletal: The bony thorax is intact. No bone lesions fractures. Review of the MIP images confirms  the above findings. CTA ABDOMEN AND PELVIS FINDINGS VASCULAR Aorta: Normal caliber. No dissection. Moderate distal aortic atherosclerotic calcification. Celiac: Mild impingement/compression of the the celiac axis by the median arcuate ligament no significant stenosis or occlusion. SMA: Normal Renals: Normal IMA: Patent Inflow: Moderate atherosclerotic calcifications involving both iliac arteries but no significant stenosis. Veins: No significant findings. Review of the MIP images confirms the above findings. NON-VASCULAR Hepatobiliary: No hepatic lesions or intrahepatic biliary dilatation. The gallbladder is grossly normal. No common bile duct dilatation. Pancreas: Stable changes of chronic pancreatitis with scattered pancreatic calcifications. Unchanged 4.0 cm and 1.2 cm cm pseudocysts in the pancreatic body/tail junction region. Stable marked atrophy of the pancreatic tail. Spleen: Normal in size without focal abnormality. Adrenals/Urinary Tract: The adrenal glands and kidneys are.  The bladder is normal. Stomach/Bowel: The stomach, duodenum, small bowel and colon are grossly normal without oral contrast. No inflammatory changes, mass lesions or obstructive findings. The appendix is normal. Lymphatic: No abdominal or pelvic lymphadenopathy. Reproductive: The prostate gland and seminal vesicles unremarkable. Mild prostate gland enlargement. Other: No pelvic mass or adenopathy. No free pelvic fluid collections. No inguinal mass or adenopathy. No abdominal wall hernia or subcutaneous lesions. Musculoskeletal: No significant bony findings. Review of the MIP images confirms the above findings. IMPRESSION: 1. Normal caliber thoracic and abdominal aorta. No aneurysm or dissection. Moderate distal aortic and iliac artery calcifications 2. No CT findings for pulmonary embolism. 3. Underlying emphysematous changes and areas of pulmonary scarring. No acute pulmonary findings or worrisome pulmonary lesions. 4. Stable changes  of chronic pancreatitis with unchanged pseudocysts in the pancreatic body/tail junction region. 5. No acute abdominal/pelvic findings, mass lesions or adenopathy. Emphysema (ICD10-J43.9). Electronically Signed   By: MYRTIS Stammer M.D.   On: 11/21/2023 22:32   DG Chest 2 View Result Date: 11/21/2023 CLINICAL DATA:  cp EXAM: CHEST - 2 VIEW COMPARISON:  Chest x-ray 11/17/2023 FINDINGS: Wireless device overlies the left chest. The heart and mediastinal contours are within normal limits. No focal consolidation. No pulmonary edema. No pleural effusion. No pneumothorax. No acute osseous abnormality. IMPRESSION: No active cardiopulmonary disease. Electronically Signed   By: Morgane  Naveau M.D.   On: 11/21/2023 14:01    Scheduled Meds:  carvedilol   25 mg Oral BID WC   famotidine   40 mg Oral Daily   folic acid   1 mg Oral Daily   lipase/protease/amylase  72,000 Units Oral TID AC   multivitamin with minerals  1 tablet Oral Daily   pantoprazole   20 mg Oral QAC breakfast   sucralfate   1 g Oral TID WC & HS   tamsulosin   0.4 mg Oral Daily   thiamine   100 mg Intravenous Daily   Continuous Infusions:  calcium  gluconate     lactated ringers  150 mL/hr at 11/22/23 0146   magnesium  sulfate bolus IVPB       LOS: 0 days   Fredia Skeeter, MD [facility]  11/22/2023, 8:58 AM   *Please note that this is a verbal dictation therefore any spelling or grammatical errors are due to the Dragon Medical One system interpretation.  Please page via Amion and do not message via secure chat for urgent patient care matters. Secure chat can be used for non urgent patient care matters.  How to contact the [facility] Attending or Consulting provider 7A - 7P or covering provider during after hours 7P -7A, for this patient?  Check the care team in [facility] and look for a) attending/consulting TR[facility] provider listed and b) the [facility] team listed. Page or secure chat 7A-7P. Log into [email or web address] and use [place]'s universal password  to access. If you do not have the password, please contact the hospital operator. Locate the [facility] provider you are looking for under [facility] and page to a number that you can be directly reached. If you still have difficulty reaching the provider, please page the [facility] (Director on Call) for the Hospitalists listed on amion for assistance.

## 2023-11-22 NOTE — ED Notes (Signed)
 Patient noted to have removed himself from monitors and was walking around department.  Did not let staff member know that he needed to be unhooked.  When patient returned to room, patient reattached monitors.

## 2023-11-22 NOTE — Progress Notes (Signed)
 Patient arrived onto the unit from the ED and ambulated in room. NT obtained VS and weight. RN attempted to perform initial skin assessment per protocol and patient refused. Patient allowed RN to auscultate lung sounds. Patient relayed to RN that patient asked for diet order and pain medicine from ED RN but never heard from ED RN. RN verbalize understanding and notified MD to clarify diet status. VS stable and RN administered prn pain medicine. Patient is a high fall risk due to prior admission resulting in a fall, patient removed high fall risk armband off of R wrist and refused bed/chair alarms. RN made Charge RN aware.

## 2023-11-22 NOTE — Discharge Instructions (Signed)
   Outpatient Substance Abuse  Treatment- uninsured  Narcotics Anonymous 24-HOUR HELPLINE Pre-recorded for Meeting Schedules [facility] [phone]  [email or web address] ALCOHOLICS ANONYMOUS  [facility]  Answering Service [phone] Please Note: All [place] Meetings are Non-smoking [email or web address]  Alcohol and Drug Services -  Insurance: Medicaid /State funding/private insurance Methadone, suboxone/Intensive outpatient  [place] [phone] Fax: [phone] [address], [place], [place], [address] [place] [phone] Fax: [phone]    [address] [place], [place], [address] ([address] [place], [place], [place], [place], [place], [place], [place], [place]) [facility] [email or web address] Accepts State funding/Medicaid Transitional housing, Intensive Outpatient Treatment, Outpatient treatment, Veterans Services  Phone: [phone] Fax: [phone] Address: [address], [place] [address]   [employer] of Care ([email or web address].[email or web address]/) Insurance: Medicaid Case Management, Administrator, arts, Medication Management, Outpatient Therapy, Psychosocial Rehabilitation, Substance Abuse Intensive Outpatient  Phone: [phone] Fax: [phone], [place], [place], [address]  [facility] Medicaid, most private insurance providers Types of Program: Individual/Group Therapy, Substance Abuse Treatment  Phone: [place] [phone] Fax: [phone] [address], [place], [place], [address] [place] [phone] [address], [address] [place], [place], [address]  [facility]  Medicaid Types of Program: SAIOP  Phone: [phone] Fax: [phone] [address] [place], [place], [place], [address] [facility] Medicaid/state funds Crisis line [phone] [place] [facility] [phone] [place] [phone] [place] [phone]  [facility] [address];  [place], [place] [address] [phone]  Substance Abuse Intensive Outpatient Program [facility] [address] [place], [place] [address] [phone] Substance abuse treatment  [facility]  Insurance: [facility], [address], sliding scale Types of Program: substance abuse treatment, transportation assistance Phone: [phone] Fax: [phone] Address: [address], [address], [place] [address] [facility] ([email or web address]) Insurance: UHC, [place], [place], [place] Program: addiction counseling, detoxification,  Phone: [phone]  Fax: [phone] Address: [address] [place], [place] [address]  [facility] (statewide facilities/programs) [address] (Medicaid/state funds) [place], [place] [address]                      http://[email or web address]/ [phone] Marcy Panning- [phone] [place]- [phone] [facility] [place] (2 Locations) (Medicaid/state funds) --[address]  walk in 8:30-12 and 1-2:30 [place], [id number]   [facility]- [phone] --[address] [place], [place] [address]  [phone] walk in 8:30-12 and 2-3:30  [facility] state funds/medicaid [address] [place], [place] [address] [phone] [facility] (Suboxone clinic) Medicaid/state funds  [address]  [place], [place] [address] [phone]   [facility]  [address] [place], [place] [address]  [phone] (24 hours) [place]- [address] [place], [place] [address]  [phone] (24 hours) [place]- [address] [place] [phone] [place]- [address] Rosalita Levan [phone] Turner Daniels [address] [place] [phone] [facility]- Medicaid and state funds  [place]- [address] [place], [place] [address] [phone] (24 hours) [place]- [address] [place], [place]  [phone] [facility]- [address] [place], [place] [address] [phone] (24 hours) [place] [address] [place], [place]  [address] [phone] [place]- [address] [place] [phone]

## 2023-11-22 NOTE — Plan of Care (Signed)
   Problem: Activity: Goal: Risk for activity intolerance will decrease Outcome: Progressing   Problem: Nutrition: Goal: Adequate nutrition will be maintained Outcome: Progressing   Problem: Coping: Goal: Level of anxiety will decrease Outcome: Progressing   Problem: Elimination: Goal: Will not experience complications related to bowel motility Outcome: Progressing

## 2023-11-22 NOTE — ED Notes (Signed)
 Patient complaining of chest pain and epigastric pain.  Requested GI cocktail.  States she said she would order for me.  Pt medicated with PRN medications

## 2023-11-22 NOTE — ED Notes (Signed)
 Pt ambulated to restroom with steady gait.

## 2023-11-22 NOTE — Progress Notes (Signed)
 CSW added substance abuse resources to patient's AVS.  Edwin Dada, MSW, LCSW Transitions of Care  Clinical Social Worker II [phone]

## 2023-11-22 NOTE — ED Notes (Signed)
 Pt requesting food. RN paged attending requesting a diet order or if pt has a procedure. RN awaiting clarification.

## 2023-11-22 NOTE — Progress Notes (Signed)
 TR[facility] night cross cover note:   I was notified by the patient's RN that the patient is complaining of breakthrough pain in spite of existing order for as needed oxycodone .  For this, I have subsequently added as needed IV Dilaudid  for breakthrough pain.  Additionally, RN conveys that most recent vital signs are notable for systolic blood pressures in the 150s, diastolic blood pressures in the 110s, with corresponding heart rates in the 90s.  I subsequently added an order for prn IV labetalol  for systolic blood pressure greater than 170 or diastolic blood pressure greater than 100 mmHg.     Eva Pore, DO Hospitalist

## 2023-11-23 ENCOUNTER — Inpatient Hospital Stay (INDEPENDENT_AMBULATORY_CARE_PROVIDER_SITE_OTHER): Payer: MEDICAID | Admitting: Primary Care

## 2023-11-23 ENCOUNTER — Other Ambulatory Visit (HOSPITAL_COMMUNITY): Payer: Self-pay

## 2023-11-23 DIAGNOSIS — I10 Essential (primary) hypertension: Secondary | ICD-10-CM

## 2023-11-23 DIAGNOSIS — F1910 Other psychoactive substance abuse, uncomplicated: Secondary | ICD-10-CM

## 2023-11-23 DIAGNOSIS — K219 Gastro-esophageal reflux disease without esophagitis: Secondary | ICD-10-CM

## 2023-11-23 DIAGNOSIS — Z720 Tobacco use: Secondary | ICD-10-CM | POA: Diagnosis not present

## 2023-11-23 DIAGNOSIS — F1010 Alcohol abuse, uncomplicated: Secondary | ICD-10-CM

## 2023-11-23 LAB — COMPREHENSIVE METABOLIC PANEL WITH GFR
ALT: 20 U/L (ref 0–44)
AST: 32 U/L (ref 15–41)
Albumin: 3.4 g/dL — ABNORMAL LOW (ref 3.5–5.0)
Alkaline Phosphatase: 65 U/L (ref 38–126)
Anion gap: 14 (ref 5–15)
BUN: 10 mg/dL (ref 6–20)
CO2: 18 mmol/L — ABNORMAL LOW (ref 22–32)
Calcium: 7.6 mg/dL — ABNORMAL LOW (ref 8.9–10.3)
Chloride: 106 mmol/L (ref 98–111)
Creatinine, Ser: 0.87 mg/dL (ref 0.61–1.24)
GFR, Estimated: >60 mL/min (ref >60)
Glucose, Bld: 97 mg/dL (ref 70–99)
Potassium: 4.2 mmol/L (ref 3.5–5.1)
Sodium: 138 mmol/L (ref 135–145)
Total Bilirubin: 0.4 mg/dL (ref 0.0–1.2)
Total Protein: 6.7 g/dL (ref 6.5–8.1)

## 2023-11-23 LAB — MAGNESIUM: Magnesium: 2 mg/dL (ref 1.7–2.4)

## 2023-11-23 MED ORDER — FOLIC ACID 1 MG PO TABS
1.0000 mg | ORAL_TABLET | Freq: Every day | ORAL | 0 refills | Status: AC
  Filled 2023-11-23: qty 30, 30d supply, fill #0

## 2023-11-23 MED ORDER — LIDOCAINE VISCOUS HCL 2 % MT SOLN
15.0000 mL | Freq: Four times a day (QID) | OROMUCOSAL | 0 refills | Status: DC | PRN
  Filled 2023-11-23: qty 100, 2d supply, fill #0

## 2023-11-23 MED ORDER — OXYCODONE HCL 5 MG PO TABS
5.0000 mg | ORAL_TABLET | Freq: Four times a day (QID) | ORAL | 0 refills | Status: DC | PRN
  Filled 2023-11-23: qty 12, 3d supply, fill #0

## 2023-11-23 MED ORDER — ONDANSETRON 4 MG PO TBDP
4.0000 mg | ORAL_TABLET | Freq: Three times a day (TID) | ORAL | 0 refills | Status: DC | PRN
  Filled 2023-11-23: qty 20, 7d supply, fill #0

## 2023-11-23 MED ORDER — OXYCODONE HCL 5 MG PO TABS: 5.0000 mg | ORAL_TABLET | Freq: Four times a day (QID) | ORAL | Status: DC | PRN

## 2023-11-23 MED ORDER — HYDROMORPHONE HCL 1 MG/ML IJ SOLN
0.5000 mg | INTRAMUSCULAR | Status: DC | PRN
  Administered 2023-11-23 (×4): 1 mg via INTRAVENOUS
  Filled 2023-11-23 (×4): qty 1

## 2023-11-23 NOTE — Plan of Care (Signed)

## 2023-11-23 NOTE — TOC Initial Note (Signed)
 Transition of Care [facility]) - Initial/Assessment Note    Patient Details  Name: [name] MRN: [id number] Date of Birth: February 19, 1971  Transition of Care [facility]) CM/SW Contact:    Isaiah Public, LCSWA Phone Number: 11/23/2023, 10:14 AM  Clinical Narrative:                    Patient reports he comes from home with mother. CSW informed patient that outpatient substance use treatment services resources was placed on AVS.Patient thanked CSW. Patient informed CSW he also has resources provided from recent hospital visit. Patient plans on following up with an outpatient substance use treatment center after he is discharged. Patient reports he may need transportation when he is discharged.CSW informed patient that discharge lounge will provide cab voucher if needed. All questions answered. No further questions reported at this time.     Patient Goals and CMS Choice            Expected Discharge Plan and Services         Expected Discharge Date: 11/23/23                                    Prior Living Arrangements/Services                       Activities of Daily Living   ADL Screening (condition at time of admission) Independently performs ADLs?: Yes (appropriate for developmental age) Is the patient deaf or have difficulty hearing?: No Does the patient have difficulty seeing, even when wearing glasses/contacts?: No Does the patient have difficulty concentrating, remembering, or making decisions?: No  Permission Sought/Granted                  Emotional Assessment              Admission diagnosis:  Epigastric pain [R10.13] Atypical chest pain [R07.89] Alcohol  use [F10.90] Cocaine use [F14.90] Abdominal pain [R10.9] Patient Active Problem List   Diagnosis Date Noted   Abdominal pain 11/22/2023   Hypertensive urgency 11/12/2023   Syncope and collapse 11/10/2023   Non-cardiac chest pain 11/10/2023   Intractable diarrhea 08/10/2023    Intractable nausea and vomiting 06/18/2023   History of peptic ulcer 03/07/2023   Lumbosacral radiculopathy 03/07/2023   Duodenitis 03/06/2023   Gastric AVM 03/06/2023   AVM (arteriovenous malformation) of small bowel, 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   Hyperkalemia 03/03/2023   Acute urinary retention 03/01/2023   Intractable pain 02/09/2023   Metabolic acidosis, increased anion gap 02/08/2023   Cocaine abuse (HCC) 02/08/2023   Adynamic ileus (HCC) 01/25/2023   Cannabinoid hyperemesis syndrome 01/25/2023   Epigastric abdominal pain 01/09/2023   Asthma, chronic 01/09/2023   Ileus (HCC) 01/09/2023   Hypokalemia 09/18/2022   NSVT (nonsustained ventricular tachycardia) (HCC) 01/20/2022   Normal anion gap metabolic acidosis 12/08/2021   Hypoalbuminemia 12/08/2021   WPW (Wolff-Parkinson-White syndrome) 12/06/2021   Paroxysmal atrial fibrillation (HCC) 12/06/2021   Pulmonary nodule 12/06/2021   Leukocytosis 06/07/2021   Epigastric pain 06/07/2021   Urinary retention    Protein-calorie malnutrition, severe 11/17/2020   Hypomagnesemia    Hypocalcemia 10/23/2020   Seizure (HCC) 11/13/2018   Acute on chronic pancreatitis (HCC) 09/28/2017   Tachycardia 03/18/2017   Nausea, vomiting, and diarrhea 03/18/2017   Diarrhea 03/18/2017   GERD (gastroesophageal reflux disease)    Alcohol  use disorder 12/05/2016   Normocytic anemia 12/05/2016   Alcohol   use disorder, severe, dependence (HCC) 07/25/2016   Major depressive disorder, 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   Chronic pancreatitis (HCC) 05/18/2015   Polysubstance abuse (tobacco, cocaine, THC, and ETOH) 03/26/2015   Essential hypertension 02/06/2014   Mood disorder (HCC) 02/06/2014   Pancreatic pseudocyst/cyst 11/25/2013   Kassie Pour White pattern seen on electrocardiogram 10/03/2012   Alcohol  abuse 03/23/2007   Tobacco abuse 03/23/2007   PCP:  Celestia Rosaline SQUIBB, NP Pharmacy:   [id number] [place], [place] - [address] AT [facility] OF [address] [place]  [address] Phone: [phone] Fax: [phone]  [name] [address] [place] [address] Phone: [phone] Fax: [phone]  [id number] [place], [place] - [address] AT [facility] [address] [place] [address] Phone: [phone] Fax: [phone]  [facility] [address] [place] [address] Phone: [phone] Fax: [phone]  [id number] - [place], [place] - [address] AT [facility] OF [address] [place] [address] Phone: [phone] Fax: [phone]     Social Drivers of Health (SDOH) Social History: SDOH Screenings   Food Insecurity: No Food Insecurity (11/23/2023)  Housing: High Risk (11/23/2023)  Transportation Needs: Unmet Transportation Needs (11/23/2023)  Utilities: Not At Risk (11/23/2023)  Alcohol  Screen: Medium Risk (03/14/2017)  Depression (PHQ2-9): Medium Risk (03/27/2020)  Social Connections: Socially Isolated (11/10/2023)  Tobacco Use: High Risk (11/21/2023)   SDOH Interventions:     Readmission Risk Interventions    06/19/2023    4:11 PM 03/07/2023    4:21 PM 01/10/2023    4:32 PM  Readmission Risk Prevention Plan  Transportation Screening Complete Complete Complete  Medication Review (RN Care Manager) Referral to Pharmacy Complete Complete  PCP or Specialist appointment within 3-5 days of discharge Complete Complete Complete  HRI or Home Care Consult Complete Complete Complete  SW Recovery Care/Counseling Consult Complete Complete Complete  Palliative Care Screening Not Applicable Not Applicable Not Applicable  Skilled Nursing Facility Not Applicable Not Applicable Not Applicable

## 2023-11-23 NOTE — Discharge Summary (Addendum)
**Note Jason-Identified via Obfuscation**  Physician Discharge Summary  [name] FMW:[id number] DOB: Sep 15, 1970 DOA: 11/21/2023  PCP: Jason Rosaline SQUIBB, NP  Admit date: 11/21/2023 Discharge date: 11/23/2023 30 Day Unplanned Readmission Risk Score    Flowsheet Row ED to Hosp-Admission (Current) from 11/21/2023 in [facility]  30 Day Unplanned Readmission Risk Score (%) 83.8 Filed at 11/23/2023 0801    This score is the patient's risk of an unplanned readmission within 30 days of being discharged (0 -100%). The score is based on dignosis, age, lab data, medications, orders, and past utilization.   Low:  0-14.9   Medium: 15-21.9   High: 22-29.9   Extreme: 30 and above          Admitted From: Home Disposition: Home  Recommendations for Outpatient Follow-up:  Follow up with PCP in 1-2 weeks Please obtain BMP/CBC in one week Please follow up with your PCP on the following pending results: Unresulted Labs (From admission, onward)     Start     Ordered   11/23/23 0808  CBC with Differential/Platelet  Add-on,   AD       Question:  Specimen collection method  Answer:  IV Team=IV Team collect   11/23/23 0807   11/23/23 0500  Comprehensive metabolic panel  Daily,   R     Question:  Specimen collection method  Answer:  IV Team=IV Team collect   11/23/23 0021   11/23/23 0500  Magnesium   Daily,   R     Question:  Specimen collection method  Answer:  IV Team=IV Team collect   11/23/23 0021              Home Health: None Equipment/Devices: None  Discharge Condition: Stable CODE STATUS: Full code Diet recommendation: Cardiac  Subjective: Seen and examined.  RN was at the bedside.  Initially patient still complained of abdominal pain and left-sided anterior chest pain.  He said that he was feeling only slightly better than yesterday.  Also complained of some nausea but he had ordered breakfast and wanted to eat the breakfast.  No reports of nausea or vomiting by nursing staff.  I then realized  that patient was  asking for Dilaudid  frequently around-the-clock.  This raised concern for drug-seeking behavior.  Discussed with the patient in length while the RN was present at the bedside.  I told him that we do not have any source for the pain that he is complaining of.  All the cardiac enzymes are negative, CT abdomen is negative.  His chest pain could very well be due to cocaine abuse and abdominal pain due to gastritis due to alcoholism.  Same discussion I had with him yesterday.  I further informed him that we are okay with him staying for pain control however my plan would be to discontinue Dilaudid  and reduce oxycodone  from every 3 hours to every 8 hours as needed.  At that point, patient started thinking differently and he started realizing that he can go home.  He further told me that he has made a call to make an appointment for the pain clinic however he is waiting for the call back.  He is hoping to get an appointment either today, tomorrow or Monday.  He requested that I prescribe him some oxycodone  to go home with for next few days and then he would be willing to go home.  After a lot of discussion, we agreed to prescribe him oxycodone  5 mg every 6-8 hours as needed only 12  tablets.  He was prescribed 12 tablets of 10 mg during recent hospitalization when he was discharged on 11/23/2023.  All of this discussion raises concern for drug-seeking behavior.  Regardless, patient is being discharged home today in a stable condition.  Brief/Interim Summary: [name] is a 53 y.o. male with medical history significant for cocaine abuse, alcohol  abuse, chronic pancreatitis, WPW syndrome status post ablation, and paroxysmal atrial fibrillation. This is his 18th ED visit in 3 months usually for chest and or abdominal pain. One week ago he was discharged after being treated for a syncopal episode.  He sustained a arm fracture with his syncope.  He was discharged with a ZOLL patch.  Today the  patient comes in because of chest pain and abdominal pain.  He does not have coronary artery disease just the arrhythmias.  Both pain started this morning. He says he also has had diarrhea and has vomited 4 times.  The 2 pains are separate the chest pain is just to the left of his sternum and the abdominal pain is lower close to his umbilicus also on the left. In the emergency department the patient did have a CT of his chest and abdomen to rule out dissection.  His there is no dissection found.  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  Since the patient does have a Zio patch on, cardiology was consulted but they recommend calling the Zio patch company to find out about the reading.  Patient was then admitted to hospital service, details below.   Abdominal pain/chest pain/history of WPW syndrome/cocaine abuse/chronic alcoholism: Patient with history of chronic pancreatitis and pseudocyst.  CT abdomen and pelvis did not show any acute pathology.  Cardiac enzymes negative.  No acute abnormalities on the EKG.  Patient continues to drink alcohol  3-4 times a week and usually drinks 3-4 beers when he drinks and continues to consume cocaine, last time he used was Friday.  Likely cause of his symptoms are gastritis due to alcoholism and possible chest pain secondary to cocaine abuse.  He used to take Bentyl  but does not take it anymore because it  does not make him feel well.  Did have vomiting last time but nausea this morning and no vomiting.  Has Zio patch since 2 weeks, we interrogated that and he did not have any arrhythmia at all per cardiology.  GI cocktail helped in the ED suggesting that he likely has gastritis causing the abdominal pain.  He was continued on Protonix  as well as pain medications.  As discussed above, he now feels comfortable going home as long as he is being given some pain medications to go home with.  Will follow-up with pain clinic.   Hypertensive emergency: Systolic  blood pressure as high as 162 and diastolic blood pressure 115.  Combined with the chest pain, meet criteria for hypertensive emergency.  Thankfully, troponins are negative.  Blood pressure improving.  Elevation could also be due to pain.  Resumed home dose of Coreg  25 mg p.o. twice daily.  Added as needed IV hydralazine .  Blood pressure much better today.   Hypomagnesemia/hypocalcemia: Replenished both.  Both normalized now.   Acute on chronic anemia of chronic disease: Baseline hemoglobin appears to be between 10-12.  Came in with 10.9 yesterday which has dropped to 8.2 today.  Could be due to dehydration and he received fluids.  FOBT negative.  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   Mild metabolic acidosis: Improved with IV normal saline.   BPH: Continue Flomax .   Substance abuse: He says that he is tired of living the way he is living, tired of being high.  Father says that he has 52 year old grandson and he wants to be sober for him.  He plans to go to rehabilitation after discharge.  He already has information about all the resources available in community.  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   Pancreatic pseudocyst/cyst   Essential hypertension   Polysubstance abuse (tobacco, cocaine, THC, and ETOH)   Major depressive disorder, recurrent severe without psychotic features (HCC)   GERD (gastroesophageal reflux disease)   Abdominal pain    Discharge Instructions   Allergies as of 11/23/2023       Reactions   Robaxin  [methocarbamol ] Other (See Comments)   Myoclonus    Bayer Aspirin  [aspirin ] Other (See Comments)   Unknown reaction   Trazodone  And Nefazodone Other (See Comments)   Muscle spasms   Adhesive [tape] Itching   Latex Itching   Toradol  [ketorolac  Tromethamine ] Other (See Comments)   Hx ulcers    Contrast Media [iodinated Contrast Media] Hives   Fish Allergy Nausea And Vomiting, Rash   Reglan  [metoclopramide ] Other (See Comments)   Muscle spasms   Salmon [fish Oil] Nausea And Vomiting, Rash   Shellfish Allergy Nausea And Vomiting, Rash        Medication List     TAKE these medications    acetaminophen  500 MG tablet Commonly known as: TYLENOL  Take 1,000 mg by mouth daily as needed for mild pain (pain score 1-3) or moderate pain (pain score 4-6).   carvedilol  25 MG tablet Commonly known as: COREG  Take 1 tablet (25 mg total) by mouth 2 (two) times daily with a meal.   CertaVite/Antioxidants Tabs Take 1 tablet by mouth daily.   Creon  36000-114000 units Cpep capsule Generic drug: lipase/protease/amylase Take 2 capsules (72,000 Units total) by mouth 3 (three) times daily with meals. May also take 1 capsule (36,000 Units total) as needed (with snacks).   famotidine  40 MG tablet Commonly known as: PEPCID  Take 1 tablet (40 mg total) by mouth daily.   folic acid  1 MG tablet Commonly known as: FOLVITE  Take 1 tablet (1 mg total) by mouth daily. What changed: Another medication with the same name was added. Make sure you understand how and when to take each.   folic acid  1 MG tablet Commonly known as: FOLVITE  Take 1 tablet (1 mg total) by mouth daily. What changed: You were already taking a medication with the same name, and this prescription was added. Make sure you understand how and when to take each.   lidocaine  2 % solution Commonly known as: XYLOCAINE  Use as directed 15 mLs in the mouth or throat every 6 (six) hours as needed for mouth pain.   loperamide  2 MG tablet Commonly known as: IMODIUM  A-D Take 2 mg by mouth 4 (four) times daily as needed for diarrhea or loose stools.   magnesium  oxide 400 (240 Mg) MG tablet Commonly known as: MAG-OX Take 1 tablet (400 mg total) by mouth daily.   nicotine  21 mg/24hr patch Commonly known as: NICODERM CQ  - dosed in mg/24  hours Place 1 patch (21 mg total) onto the skin daily. What changed:  when to take this reasons to take this   ondansetron  4  MG disintegrating tablet Commonly known as: ZOFRAN -ODT Take 1 tablet (4 mg total) by mouth every 8 (eight) hours as needed. What changed: reasons to take this   oxyCODONE  5 MG immediate release tablet Commonly known as: Oxy IR/ROXICODONE  Take 1 tablet (5 mg total) by mouth every 6 (six) hours as needed for moderate pain (pain score 4-6) or breakthrough pain. What changed:  medication strength how much to take reasons to take this   pantoprazole  20 MG tablet Commonly known as: PROTONIX  Take 1 tablet (20 mg total) by mouth daily before breakfast.   polyethylene glycol powder 17 GM/SCOOP powder Commonly known as: GLYCOLAX /MIRALAX  Take 17 g by mouth daily as needed for mild constipation.   sucralfate  1 g tablet Commonly known as: CARAFATE  Take 1 g by mouth 3 (three) times daily as needed (stomach pain).   tamsulosin  0.4 MG Caps capsule Commonly known as: FLOMAX  Take 1 capsule (0.4 mg total) by mouth daily.   thiamine  100 MG tablet Commonly known as: VITAMIN B1 Take 1 tablet (100 mg total) by mouth daily.        Follow-up Information     Jason Rosaline SQUIBB, NP Follow up in 1 week(s).   Specialty: Internal Medicine Contact information: [address] [place] [address] [phone]                Allergies  Allergen Reactions   Robaxin  [Methocarbamol ] Other (See Comments)    Myoclonus    Bayer Aspirin  [Aspirin ] Other (See Comments)    Unknown reaction   Trazodone  And Nefazodone Other (See Comments)    Muscle spasms   Adhesive [Tape] Itching   Latex Itching   Toradol  [Ketorolac  Tromethamine ] Other (See Comments)    Hx ulcers   Contrast Media [Iodinated Contrast Media] Hives   Fish Allergy Nausea And Vomiting and Rash   Reglan  [Metoclopramide ] Other (See Comments)    Muscle spasms   Salmon [Fish Oil] Nausea And Vomiting  and Rash   Shellfish Allergy Nausea And Vomiting and Rash    Consultations: None   Procedures/Studies: CT Angio Chest/Abd/Pel for Dissection W and/or Wo Contrast Result Date: 11/21/2023 CLINICAL DATA:  Chest and abdominal pain. Suspected acute aortic syndrome. Recent CT abdomen/pelvis 11/07/2023 EXAM: CT ANGIOGRAPHY CHEST, ABDOMEN AND PELVIS TECHNIQUE: Non-contrast CT of the chest was initially obtained. Multidetector CT imaging through the chest, abdomen and pelvis was performed using the standard protocol during bolus administration of intravenous contrast. Multiplanar reconstructed images and MIPs were obtained and reviewed to evaluate the vascular anatomy. RADIATION DOSE REDUCTION: This exam was performed according to the departmental dose-optimization program which includes automated exposure control, adjustment of the mA and/or kV according to patient size and/or use of iterative reconstruction technique. CONTRAST:  OMNIPAQUE  IOHEXOL  350 MG/ML SOLN COMPARISON:  CT abdomen/pelvis 11/07/2023 FINDINGS: CTA CHEST FINDINGS Cardiovascular: The heart is normal in size. No pericardial effusion. The aorta is normal in caliber. No dissection or atherosclerotic calcification. The branch vessels are patent. No coronary artery calcifications are noted. The pulmonary arteries are normal. No filling defects to suggest pulmonary emboli. Mediastinum/Nodes: No mediastinal or hilar mass or lymphadenopathy. Small scattered lymph nodes are noted. The esophagus is unremarkable. The thyroid  gland is normal. Lungs/Pleura: Underlying emphysematous changes and areas of pulmonary scarring. No infiltrates, edema or effusions. No worrisome pulmonary lesions or pulmonary nodules. No pneumothorax. Musculoskeletal: The bony thorax is intact. No bone lesions fractures. Review of the MIP images confirms the above findings. CTA ABDOMEN AND PELVIS FINDINGS VASCULAR Aorta:  Normal caliber. No dissection. Moderate distal aortic  atherosclerotic calcification. Celiac: Mild impingement/compression of the the celiac axis by the median arcuate ligament no significant stenosis or occlusion. SMA: Normal Renals: Normal IMA: Patent Inflow: Moderate atherosclerotic calcifications involving both iliac arteries but no significant stenosis. Veins: No significant findings. Review of the MIP images confirms the above findings. NON-VASCULAR Hepatobiliary: No hepatic lesions or intrahepatic biliary dilatation. The gallbladder is grossly normal. No common bile duct dilatation. Pancreas: Stable changes of chronic pancreatitis with scattered pancreatic calcifications. Unchanged 4.0 cm and 1.2 cm cm pseudocysts in the pancreatic body/tail junction region. Stable marked atrophy of the pancreatic tail. Spleen: Normal in size without focal abnormality. Adrenals/Urinary Tract: The adrenal glands and kidneys are. The bladder is normal. Stomach/Bowel: The stomach, duodenum, small bowel and colon are grossly normal without oral contrast. No inflammatory changes, mass lesions or obstructive findings. The appendix is normal. Lymphatic: No abdominal or pelvic lymphadenopathy. Reproductive: The prostate gland and seminal vesicles unremarkable. Mild prostate gland enlargement. Other: No pelvic mass or adenopathy. No free pelvic fluid collections. No inguinal mass or adenopathy. No abdominal wall hernia or subcutaneous lesions. Musculoskeletal: No significant bony findings. Review of the MIP images confirms the above findings. IMPRESSION: 1. Normal caliber thoracic and abdominal aorta. No aneurysm or dissection. Moderate distal aortic and iliac artery calcifications 2. No CT findings for pulmonary embolism. 3. Underlying emphysematous changes and areas of pulmonary scarring. No acute pulmonary findings or worrisome pulmonary lesions. 4. Stable changes of chronic pancreatitis with unchanged pseudocysts in the pancreatic body/tail junction region. 5. No acute  abdominal/pelvic findings, mass lesions or adenopathy. Emphysema (ICD10-J43.9). Electronically Signed   By: MYRTIS Stammer M.D.   On: 11/21/2023 22:32   DG Chest 2 View Result Date: 11/21/2023 CLINICAL DATA:  cp EXAM: CHEST - 2 VIEW COMPARISON:  Chest x-ray 11/17/2023 FINDINGS: Wireless device overlies the left chest. The heart and mediastinal contours are within normal limits. No focal consolidation. No pulmonary edema. No pleural effusion. No pneumothorax. No acute osseous abnormality. IMPRESSION: No active cardiopulmonary disease. Electronically Signed   By: Morgane  Naveau M.D.   On: 11/21/2023 14:01   DG Chest 2 View Result Date: 11/17/2023 CLINICAL DATA:  Chest pains. EXAM: CHEST - 2 VIEW COMPARISON:  PA and lateral chest 11/06/2023 FINDINGS: The heart size and mediastinal contours are within normal limits. An electronic device overlies the anterior left chest. Both lungs are clear. The visualized skeletal structures are intact but somewhat demineralized. There are multiple overlying monitor wires. IMPRESSION: No evidence of acute chest disease. Electronically Signed   By: Francis Quam M.D.   On: 11/17/2023 03:00   ECHOCARDIOGRAM COMPLETE Result Date: 11/10/2023    ECHOCARDIOGRAM REPORT   Patient Name:   [facility] [place] Date of Exam: 11/10/2023 Medical Rec #:  [id number]              Height:       68.0 in Accession #:    [id number]             Weight:       122.7 lb Date of Birth:  06-16-1970               BSA:          1.661 m Patient Age:    53 years               BP:           203/112 mmHg Patient Gender: M  HR:           76 bpm. Exam Location:  Inpatient Procedure: 2D Echo (Both Spectral and Color Flow Doppler were utilized during            procedure). Indications:    syncope  History:        Patient has prior history of Echocardiogram examinations, most                 recent 02/16/2022. Arrythmias:WPW; Risk Factors:Hypertension,                 Current Smoker and  polysubstance abuse.  Sonographer:    Tinnie Barefoot RDCS Referring Phys: [id number] MARSA NOVAK MELVIN IMPRESSIONS  1. Left ventricular ejection fraction, by estimation, is 70 to 75%. The left ventricle has hyperdynamic function. The left ventricle has no regional wall motion abnormalities. Left ventricular diastolic parameters are consistent with Grade I diastolic dysfunction (impaired relaxation).  2. Right ventricular systolic function is normal. The right ventricular size is normal. There is mildly elevated pulmonary artery systolic pressure. The estimated right ventricular systolic pressure is 44.0 mmHg.  3. The mitral valve is normal in structure. No evidence of mitral valve regurgitation. No evidence of mitral stenosis.  4. The aortic valve has an indeterminant number of cusps. Aortic valve regurgitation is not visualized. No aortic stenosis is present.  5. The inferior vena cava is normal in size with <50% respiratory variability, suggesting right atrial pressure of 8 mmHg. Comparison(s): No significant change from prior study. FINDINGS  Left Ventricle: Left ventricular ejection fraction, by estimation, is 70 to 75%. The left ventricle has hyperdynamic function. The left ventricle has no regional wall motion abnormalities. Strain was performed and the global longitudinal strain is indeterminate. The left ventricular internal cavity size was normal in size. There is no left ventricular hypertrophy. Left ventricular diastolic parameters are consistent with Grade I diastolic dysfunction (impaired relaxation). Right Ventricle: The right ventricular size is normal. No increase in right ventricular wall thickness. Right ventricular systolic function is normal. There is mildly elevated pulmonary artery systolic pressure. The tricuspid regurgitant velocity is 3.00  m/s, and with an assumed right atrial pressure of 8 mmHg, the estimated right ventricular systolic pressure is 44.0 mmHg. Left Atrium: Left atrial size  was normal in size. Right Atrium: Right atrial size was normal in size. Pericardium: There is no evidence of pericardial effusion. Mitral Valve: The mitral valve is normal in structure. No evidence of mitral valve regurgitation. No evidence of mitral valve stenosis. Tricuspid Valve: The tricuspid valve is normal in structure. Tricuspid valve regurgitation is trivial. No evidence of tricuspid stenosis. Aortic Valve: The aortic valve has an indeterminant number of cusps. Aortic valve regurgitation is not visualized. No aortic stenosis is present. Pulmonic Valve: The pulmonic valve was not well visualized. Pulmonic valve regurgitation is not visualized. No evidence of pulmonic stenosis. Aorta: The aortic root and ascending aorta are structurally normal, with no evidence of dilitation. Venous: The inferior vena cava is normal in size with less than 50% respiratory variability, suggesting right atrial pressure of 8 mmHg. IAS/Shunts: No atrial level shunt detected by color flow Doppler. Additional Comments: 3D was performed not requiring image post processing on an independent workstation and was indeterminate.  LEFT VENTRICLE PLAX 2D LVIDd:         4.40 cm   Diastology LVIDs:         2.60 cm   LV e' medial:    11.00 cm/s LV  PW:         0.80 cm   LV E/e' medial:  11.1 LV IVS:        0.60 cm   LV e' lateral:   12.60 cm/s LVOT diam:     1.90 cm   LV E/e' lateral: 9.7 LV SV:         58 LV SV Index:   35 LVOT Area:     2.84 cm  RIGHT VENTRICLE             IVC RV Basal diam:  2.50 cm     IVC diam: 2.00 cm RV S prime:     14.80 cm/s TAPSE (M-mode): 1.8 cm LEFT ATRIUM             Index        RIGHT ATRIUM          Index LA diam:        3.00 cm 1.81 cm/m   RA Area:     9.63 cm LA Vol (A2C):   37.3 ml 22.46 ml/m  RA Volume:   19.90 ml 11.98 ml/m LA Vol (A4C):   31.1 ml 18.73 ml/m LA Biplane Vol: 34.4 ml 20.72 ml/m  AORTIC VALVE LVOT Vmax:   117.00 cm/s LVOT Vmean:  77.300 cm/s LVOT VTI:    0.206 m  AORTA Ao Root diam: 2.70  cm Ao Asc diam:  2.60 cm MITRAL VALVE                TRICUSPID VALVE MV Area (PHT): 5.02 cm     TR Peak grad:   36.0 mmHg MV Decel Time: 151 msec     TR Vmax:        300.00 cm/s MV E velocity: 122.00 cm/s MV A velocity: 96.30 cm/s   SHUNTS MV E/A ratio:  1.27         Systemic VTI:  0.21 m                             Systemic Diam: 1.90 cm Vishnu Priya Mallipeddi Electronically signed by Diannah Late Mallipeddi Signature Date/Time: 11/10/2023/4:55:23 PM    Final    CT Head Wo Contrast Result Date: 11/10/2023 CLINICAL DATA:  Ataxia.  Head trauma. EXAM: CT HEAD WITHOUT CONTRAST TECHNIQUE: Contiguous axial images were obtained from the base of the skull through the vertex without intravenous contrast. RADIATION DOSE REDUCTION: This exam was performed according to the departmental dose-optimization program which includes automated exposure control, adjustment of the mA and/or kV according to patient size and/or use of iterative reconstruction technique. COMPARISON:  CT 10/19/2023 FINDINGS: Brain: No acute intracranial hemorrhage. No focal mass lesion. No CT evidence of acute infarction. No midline shift or mass effect. No hydrocephalus. Basilar cisterns are patent. Vascular: No hyperdense vessel or unexpected calcification. Skull: Normal. Negative for fracture or focal lesion. Sinuses/Orbits: Paranasal sinuses and mastoid air cells are clear. Orbits are clear. Postsurgical hardware in the medial LEFT orbit. Other: None. IMPRESSION: 1. No acute intracranial findings. 2. Postsurgical changes in the LEFT orbit. Electronically Signed   By: Jackquline Boxer M.D.   On: 11/10/2023 11:57   DG Wrist Complete Left Result Date: 11/10/2023 CLINICAL DATA:  Unwitnessed fall.  Left wrist pain and deformity. EXAM: LEFT WRIST - COMPLETE 3+ VIEW COMPARISON:  None Available. FINDINGS: Three views are submitted. The mineralization is adequate. There is a nondisplaced ulnar styloid fracture. Probable nondisplaced fracture  of the distal  radius with mild dorsal cortical irregularity. No evidence of dislocation or carpal bone fracture. IV tubing in place. Moderate soft tissue swelling around the wrist without evidence of unexpected foreign body or soft tissue emphysema. IMPRESSION: Nondisplaced ulnar styloid fracture. Probable nondisplaced distal radius fracture. No dislocation. Electronically Signed   By: Elsie Perone M.D.   On: 11/10/2023 11:43   US  Abdomen Limited RUQ (LIVER/GB) Result Date: 11/07/2023 CLINICAL DATA:  Right upper quadrant pain EXAM: ULTRASOUND ABDOMEN LIMITED RIGHT UPPER QUADRANT COMPARISON:  None Available. FINDINGS: Gallbladder: No gallstones or wall thickening visualized. No sonographic Murphy sign noted by sonographer. Common bile duct: Diameter: 5 mm Liver: No focal lesion identified. Within normal limits in parenchymal echogenicity. Portal vein is patent on color Doppler imaging with normal direction of blood flow towards the liver. Other: None. IMPRESSION: No gallstones or ductal dilatation. Electronically Signed   By: Ranell Bring M.D.   On: 11/07/2023 10:12   CT ABDOMEN PELVIS WO CONTRAST Result Date: 11/07/2023 CLINICAL DATA:  Left lower quadrant abdominal pain, chronic pancreatitis, diarrhea EXAM: CT ABDOMEN AND PELVIS WITHOUT CONTRAST TECHNIQUE: Multidetector CT imaging of the abdomen and pelvis was performed following the standard protocol without IV contrast. RADIATION DOSE REDUCTION: This exam was performed according to the departmental dose-optimization program which includes automated exposure control, adjustment of the mA and/or kV according to patient size and/or use of iterative reconstruction technique. COMPARISON:  CT abdomen pelvis 10/22/2023 FINDINGS: Lower chest: No acute abnormality. Hepatobiliary: No focal liver abnormality is seen. No gallstones, gallbladder wall thickening, or biliary dilatation. Pancreas: Coarse calcifications in the pancreatic head and body compatible with sequela of chronic  pancreatitis. 4.0 cm and 1.2 cm cysts in the pancreatic talar similar to prior and compatible with pseudocysts. No evidence of acute pancreatic inflammation. No ductal dilation. Spleen: Unremarkable. Adrenals/Urinary Tract: Unremarkable adrenal glands. No urinary calculi or hydronephrosis. Unremarkable bladder. Stomach/Bowel: Normal caliber large and small bowel. No bowel wall thickening. Stomach is unremarkable. Vascular/Lymphatic: Aortic atherosclerosis. No enlarged abdominal or pelvic lymph nodes. Unremarkable. Reproductive: Air. Other: No free intraperitoneal fluid or Musculoskeletal: No acute fracture. IMPRESSION: 1. No acute abnormality in the abdomen or pelvis. 2. 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 No evidence of acute pancreatic inflammation. 3. Aortic Atherosclerosis (ICD10-I70.0). Electronically Signed   By: Norman Gatlin M.D.   On: 11/07/2023 01:54   DG Chest 2 View Result Date: 11/06/2023 CLINICAL DATA:  Chest pain EXAM: CHEST - 2 VIEW COMPARISON:  10/21/2023 FINDINGS: The heart size and mediastinal contours are within normal limits. Both lungs are clear. The visualized skeletal structures are unremarkable. IMPRESSION: No active cardiopulmonary disease. Electronically Signed   By: Oneil Devonshire M.D.   On: 11/06/2023 23:39     Discharge Exam: Vitals:   11/23/23 0553 11/23/23 0900  BP: 119/82 (!) 145/91  Pulse: 99 82  Resp:  14  Temp:  98.2 F (36.8 C)  SpO2:  99%   Vitals:   11/23/23 0310 11/23/23 0451 11/23/23 0553 11/23/23 0900  BP: (!) 148/88 (!) 150/97 119/82 (!) 145/91  Pulse: 98 92 99 82  Resp: 20   14  Temp: 97.7 F (36.5 C)   98.2 F (36.8 C)  TempSrc: Oral   Oral  SpO2: 99%   99%  Weight:      Height:        General: Pt is alert, awake, not in acute distress Cardiovascular: RRR, S1/S2 +, no rubs, no gallops Respiratory: CTA  bilaterally, no wheezing, no rhonchi Abdominal: Soft, NT, ND, bowel sounds + Extremities: no edema,  no cyanosis    The results of significant diagnostics from this hospitalization (including imaging, microbiology, ancillary and laboratory) are listed below for reference.     Microbiology: No results found for this or any previous visit (from the past 240 hours).   Labs: BNP (last 3 results) Recent Labs    08/13/23 1001  BNP 423.9*   Basic Metabolic Panel: Recent Labs  Lab 11/17/23 0215 11/21/23 1839 11/22/23 0546 11/22/23 1838 11/23/23 0428  NA 139 139 139 136 138  K 3.6 3.8 4.2 3.7 4.2  CL 108 106 110 106 106  CO2 18* 19* 17* 18* 18*  GLUCOSE 88 74 135* 129* 97  BUN 9 6 7 9 10   CREATININE 0.93 0.86 0.72 1.04 0.87  CALCIUM  7.8* 7.6* 5.6* 6.9* 7.6*  MG  --   --  0.6* 0.7* 2.0   Liver Function Tests: Recent Labs  Lab 11/17/23 0215 11/21/23 1839 11/22/23 0546 11/23/23 0428  AST 27 31 38 32  ALT 20 26 19 20   ALKPHOS 64 73 49 65  BILITOT 0.6 0.6 0.8 0.4  PROT 6.8 7.4 5.0* 6.7  ALBUMIN  3.5 3.9 2.5* 3.4*   Recent Labs  Lab 11/17/23 0215 11/21/23 1839 11/22/23 0546  LIPASE 21 24 22    No results for input(s): AMMONIA in the last 168 hours. CBC: Recent Labs  Lab 11/17/23 0215 11/21/23 1839 11/22/23 0546  WBC 6.7 7.0 7.4  NEUTROABS 4.0 3.4  --   HGB 9.9* 10.9* 8.2*  HCT 30.8* 33.1* 25.4*  MCV 96.6 91.9 95.1  PLT 182 217 168   Cardiac Enzymes: No results for input(s): CKTOTAL, CKMB, CKMBINDEX, TROPONINI in the last 168 hours. BNP: Invalid input(s): POCBNP CBG: No results for input(s): GLUCAP in the last 168 hours. D-Dimer Recent Labs    11/21/23 1839  DDIMER 0.40   Hgb A1c No results for input(s): HGBA1C in the last 72 hours. Lipid Profile No results for input(s): CHOL, HDL, LDLCALC, TRIG, CHOLHDL, LDLDIRECT in the last 72 hours. Thyroid  function studies No results for input(s): TSH, T4TOTAL, T3FREE, THYROIDAB in the last 72 hours.  Invalid input(s): FREET3 Anemia work up Recent Labs     11/22/23 0546 11/22/23 0838 11/22/23 1838  VITAMINB12  --   --  319  FOLATE  --  16.6  --   FERRITIN  --   --  20*  TIBC  --   --  381  IRON  --   --  109  RETICCTPCT 1.8  --   --    Urinalysis    Component Value Date/Time   COLORURINE COLORLESS (A) 11/06/2023 2346   APPEARANCEUR CLEAR 11/06/2023 2346   LABSPEC 1.002 (L) 11/06/2023 2346   PHURINE 6.0 11/06/2023 2346   GLUCOSEU NEGATIVE 11/06/2023 2346   HGBUR NEGATIVE 11/06/2023 2346   HGBUR negative 04/30/2010 1020   BILIRUBINUR NEGATIVE 11/06/2023 2346   BILIRUBINUR negative 09/06/2019 1649   KETONESUR NEGATIVE 11/06/2023 2346   PROTEINUR NEGATIVE 11/06/2023 2346   UROBILINOGEN 0.2 09/06/2019 1649   UROBILINOGEN 0.2 03/15/2015 0508   NITRITE NEGATIVE 11/06/2023 2346   LEUKOCYTESUR NEGATIVE 11/06/2023 2346   Sepsis Labs Recent Labs  Lab 11/17/23 0215 11/21/23 1839 11/22/23 0546  WBC 6.7 7.0 7.4   Microbiology No results found for this or any previous visit (from the past 240 hours).  FURTHER DISCHARGE INSTRUCTIONS:   Get Medicines reviewed and adjusted: Please take  all your medications with you for your next visit with your Primary MD   Laboratory/radiological data: Please request your Primary MD to go over all hospital tests and procedure/radiological results at the follow up, please ask your Primary MD to get all Hospital records sent to his/her office.   In some cases, they will be blood work, cultures and biopsy results pending at the time of your discharge. Please request that your primary care M.D. goes through all the records of your hospital data and follows up on these results.   Also Note the following: If you experience worsening of your admission symptoms, develop shortness of breath, life threatening emergency, suicidal or homicidal thoughts you must seek medical attention immediately by calling 911 or calling your MD immediately  if symptoms less severe.   You must read complete  instructions/literature along with all the possible adverse reactions/side effects for all the Medicines you take and that have been prescribed to you. Take any new Medicines after you have completely understood and accpet all the possible adverse reactions/side effects.    patient was instructed, not to drive, operate heavy machinery, perform activities at heights, swimming or participation in water  activities or provide baby-sitting services while on Pain, Sleep and Anxiety Medications; until their outpatient Physician has advised to do so again. Also recommended to not to take more than prescribed Pain, Sleep and Anxiety Medications.  It is not advisable to combine anxiety, sleep and pain medications without talking with your primary care provider.     Wear Seat belts while driving.   Please note: You were cared for by a hospitalist during your hospital stay. Once you are discharged, your primary care physician will handle any further medical issues. Please note that NO REFILLS for any discharge medications will be authorized once you are discharged, as it is imperative that you return to your primary care physician (or establish a relationship with a primary care physician if you do not have one) for your post hospital discharge needs so that they can reassess your need for medications and monitor your lab values  Time coordinating discharge: Over 30 minutes  SIGNED:   Fredia Skeeter, MD  [facility] 11/23/2023, 10:51 AM *Please note that this is a verbal dictation therefore any spelling or grammatical errors are due to the Dragon Medical One system interpretation. If 7PM-7AM, please contact night-coverage [email or web address]

## 2023-11-24 ENCOUNTER — Telehealth (INDEPENDENT_AMBULATORY_CARE_PROVIDER_SITE_OTHER): Payer: Self-pay

## 2023-11-24 NOTE — Transitions of Care (Post Inpatient/ED Visit) (Signed)
   11/24/2023  Name: [name] MRN: [id number] DOB: 1970-08-27  Today's TOC FU Call Status: Today's TOC FU Call Status:: Unsuccessful Call (1st Attempt) Unsuccessful Call (1st Attempt) Date: 11/24/23  Attempted to reach the patient regarding the most recent Inpatient/ED visit.  Follow Up Plan: Additional outreach attempts will be made to reach the patient to complete the Transitions of Care (Post Inpatient/ED visit) call.   Signature Julian Lemmings, LPN [facility] Nurse Health Advisor Direct Dial [phone]

## 2023-11-27 IMAGING — CT CT ABD-PELV W/O CM
2 of 4 series · 16 of 46 positions shown, 18 images · non-contrast
Comparison: CT abdomen and pelvis 04/11/2021.

CLINICAL DATA: Left upper quadrant abdominal pain. History of
chronic pancreatitis.



[Series 3: ap without · axial · non-contrast · 0.68mm/px · z∈[+843,+1218]mm · 13 of 85 slices shown, 15 images]
[im 5/85  soft-tissue]
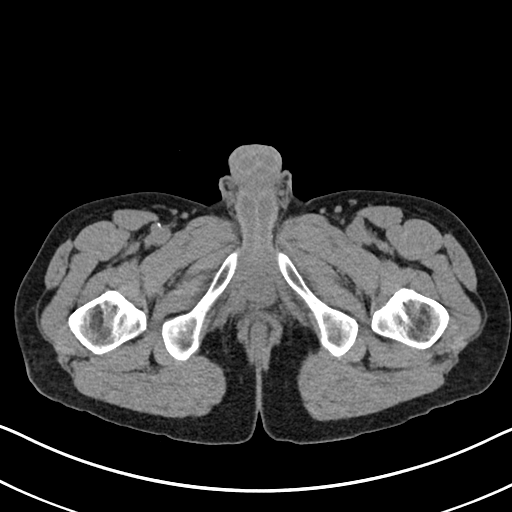
[im 5/85  bone]
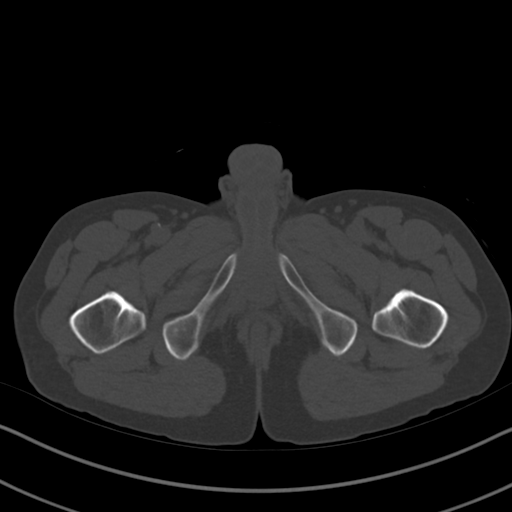
[im 10/85  soft-tissue]
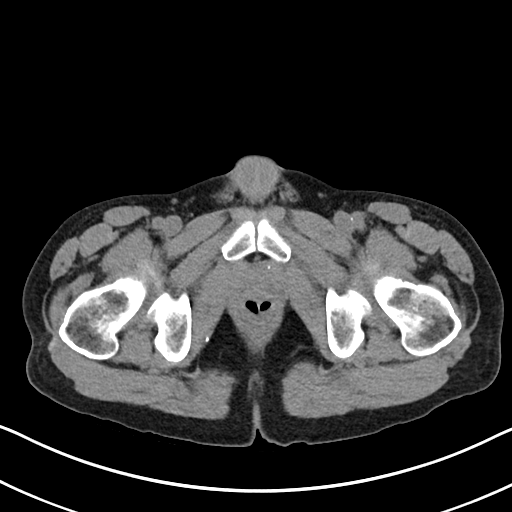
[im 19/85  soft-tissue]
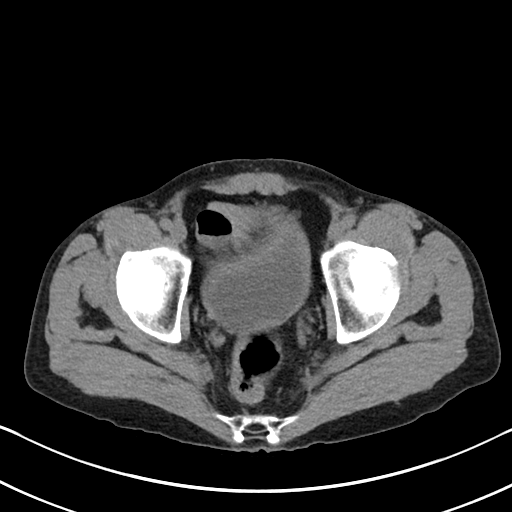
[im 24/85  soft-tissue]
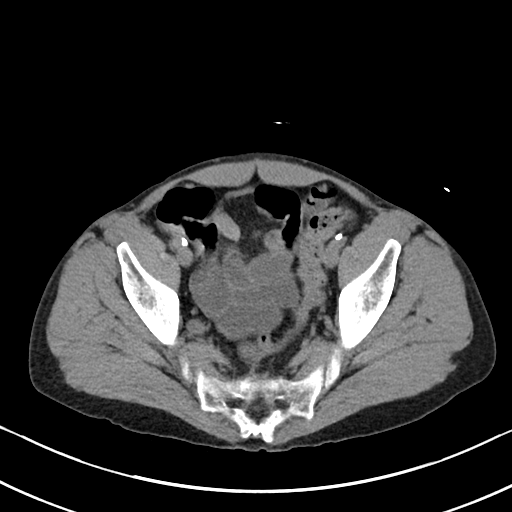
[im 29/85  soft-tissue]
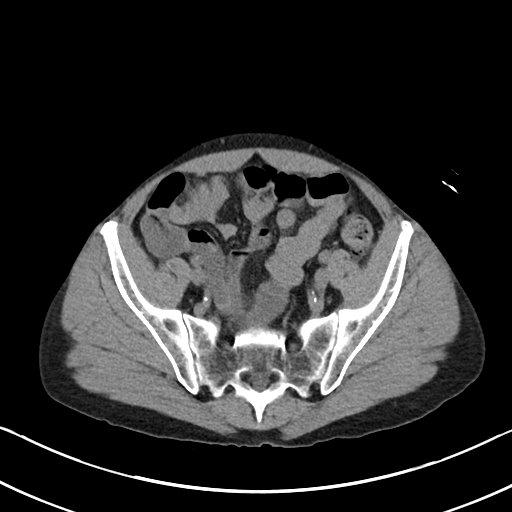
[im 38/85  soft-tissue]
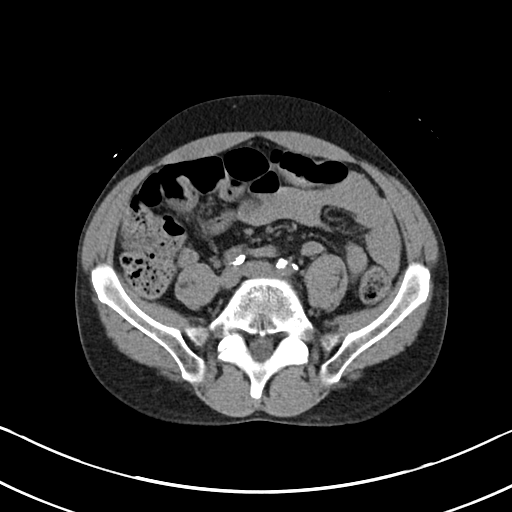
[im 43/85  soft-tissue]
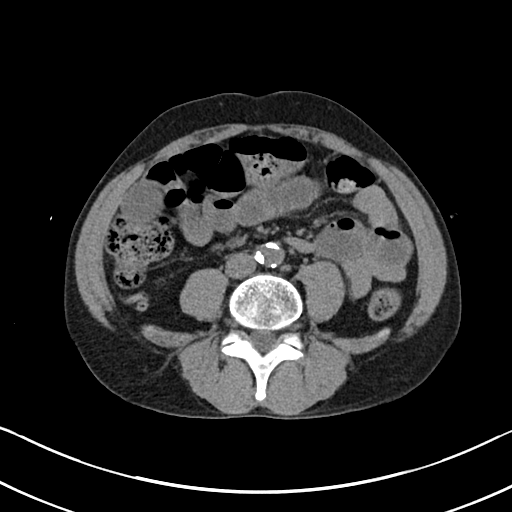
[im 47/85  soft-tissue]
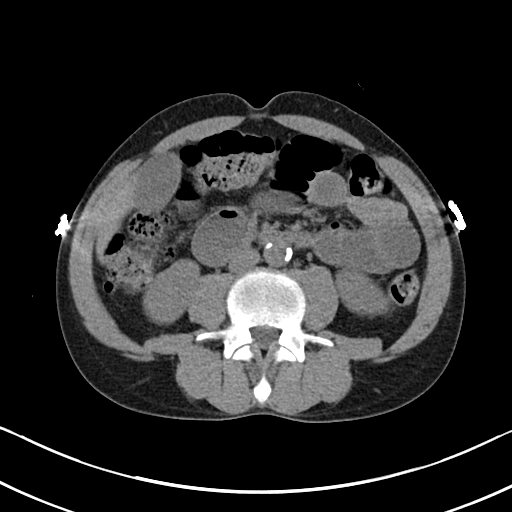
[im 57/85  soft-tissue]
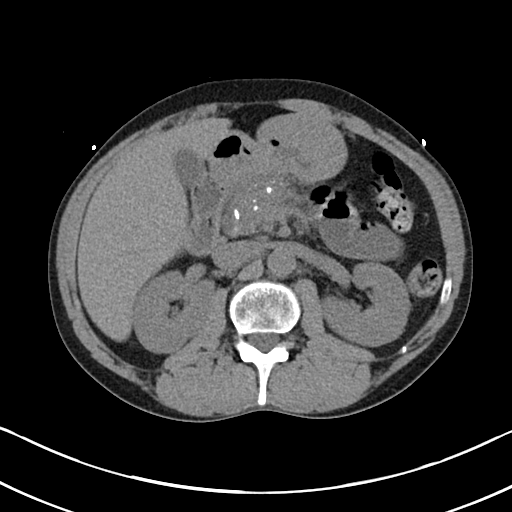
[im 57/85  bone]
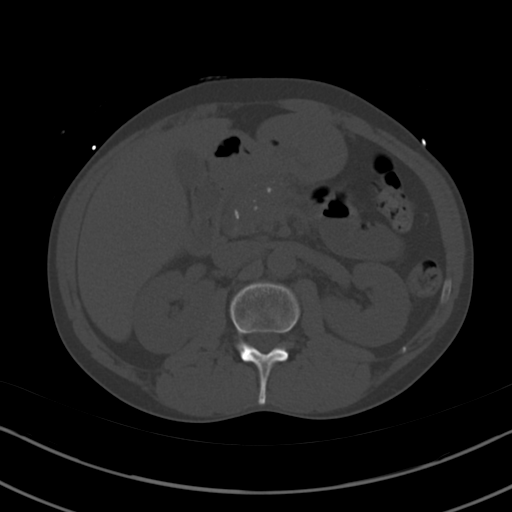
[im 61/85  soft-tissue]
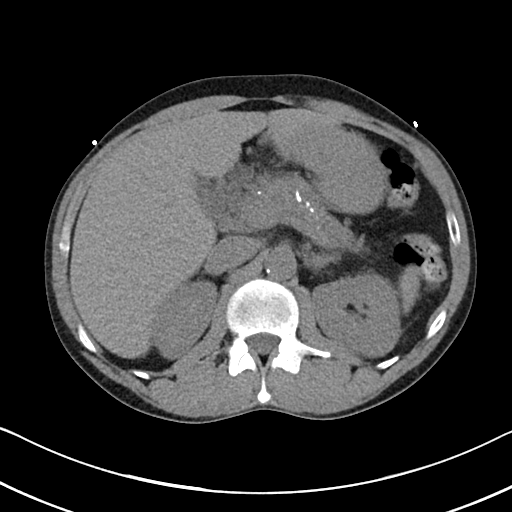
[im 66/85  soft-tissue]
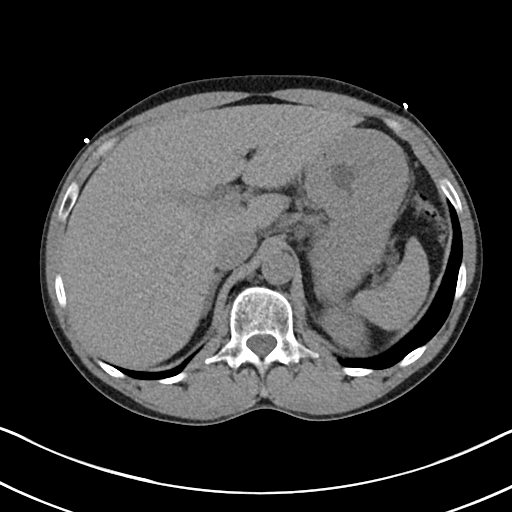
[im 75/85  soft-tissue]
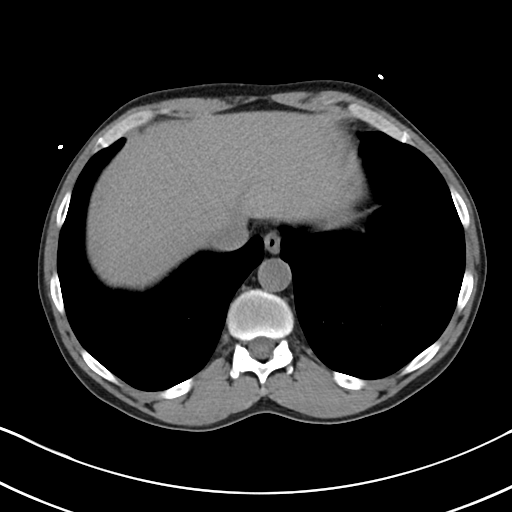
[im 80/85  soft-tissue]
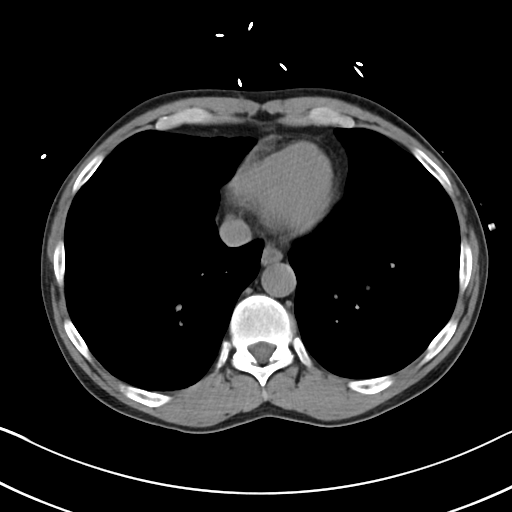

[Series 6: cor · coronal · 0.64mm/px · 3 of 88 slices shown]
[im 30/88  soft-tissue]
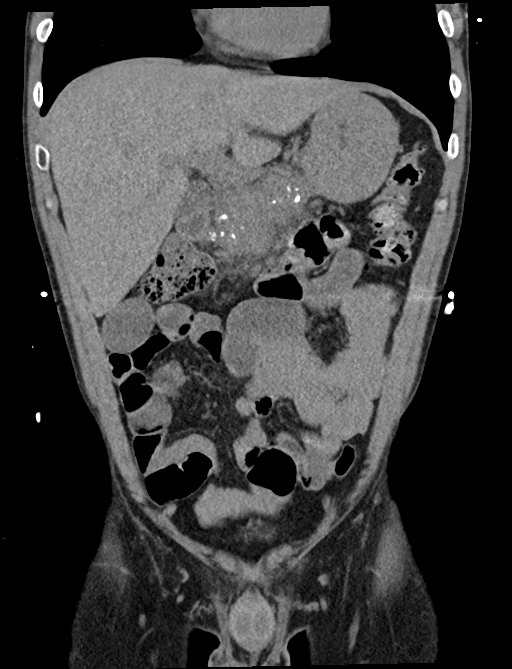
[im 39/88  soft-tissue]
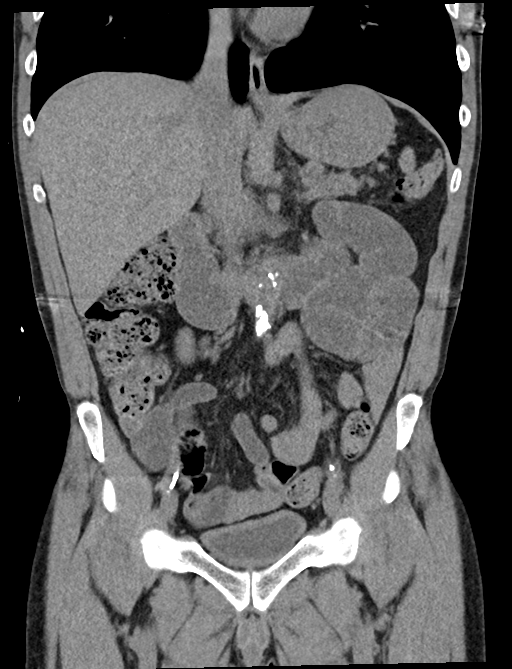
[im 49/88  soft-tissue]
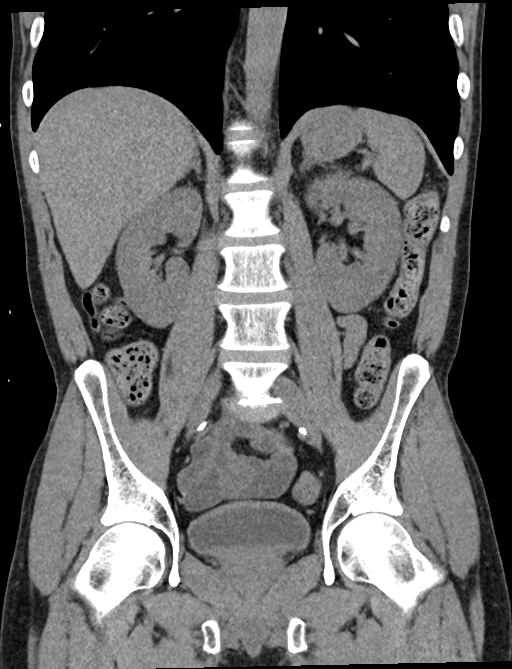

[16 of 46 positions shown; findings below may reference images not displayed]

FINDINGS: Lower chest: No acute abnormality.

Hepatobiliary: No focal liver abnormality is seen. No gallstones,
gallbladder wall thickening, or biliary dilatation.

Pancreas: Diffuse pancreatic calcifications are again seen. There is
mild inflammatory stranding surrounding the body of the pancreas.
There are 2 low-attenuation/cystic lesions in the body and tail the
pancreas measuring 1.7 and 1.0 cm respectively. These have not
significantly changed.

Spleen: Normal in size without focal abnormality.

Adrenals/Urinary Tract: Adrenal glands are unremarkable. Kidneys are
normal, without renal calculi, focal lesion, or hydronephrosis.
Bladder is unremarkable.

Stomach/Bowel: Stomach is within normal limits. Appendix appears
normal. No evidence of bowel wall thickening, distention, or
inflammatory changes. There are scattered air-fluid levels
throughout the small bowel.

Vascular/Lymphatic: Aortic atherosclerosis. No enlarged abdominal or
pelvic lymph nodes.

Reproductive: Prostate is unremarkable.

Other: No abdominal wall hernia or abnormality. No abdominopelvic
ascites.

Musculoskeletal: No acute or significant osseous findings.
IMPRESSION: 1. Mild acute pancreatitis.
2. Stable findings compatible with chronic pancreatitis and presumed
pseudocysts in the body and tail.
3. Air-fluid levels throughout small bowel loops. Findings are
nonspecific, but can be seen with enteritis.

## 2023-11-27 IMAGING — DX DG CHEST 2V
2 series · 2 of 2 positions shown · non-contrast
Comparison: 03/23/2021.

CLINICAL DATA: Chest and abdominal pain. Nausea vomiting and
diarrhea for 1 day.

EXAM:
CHEST - 2 VIEW

[chest pa]
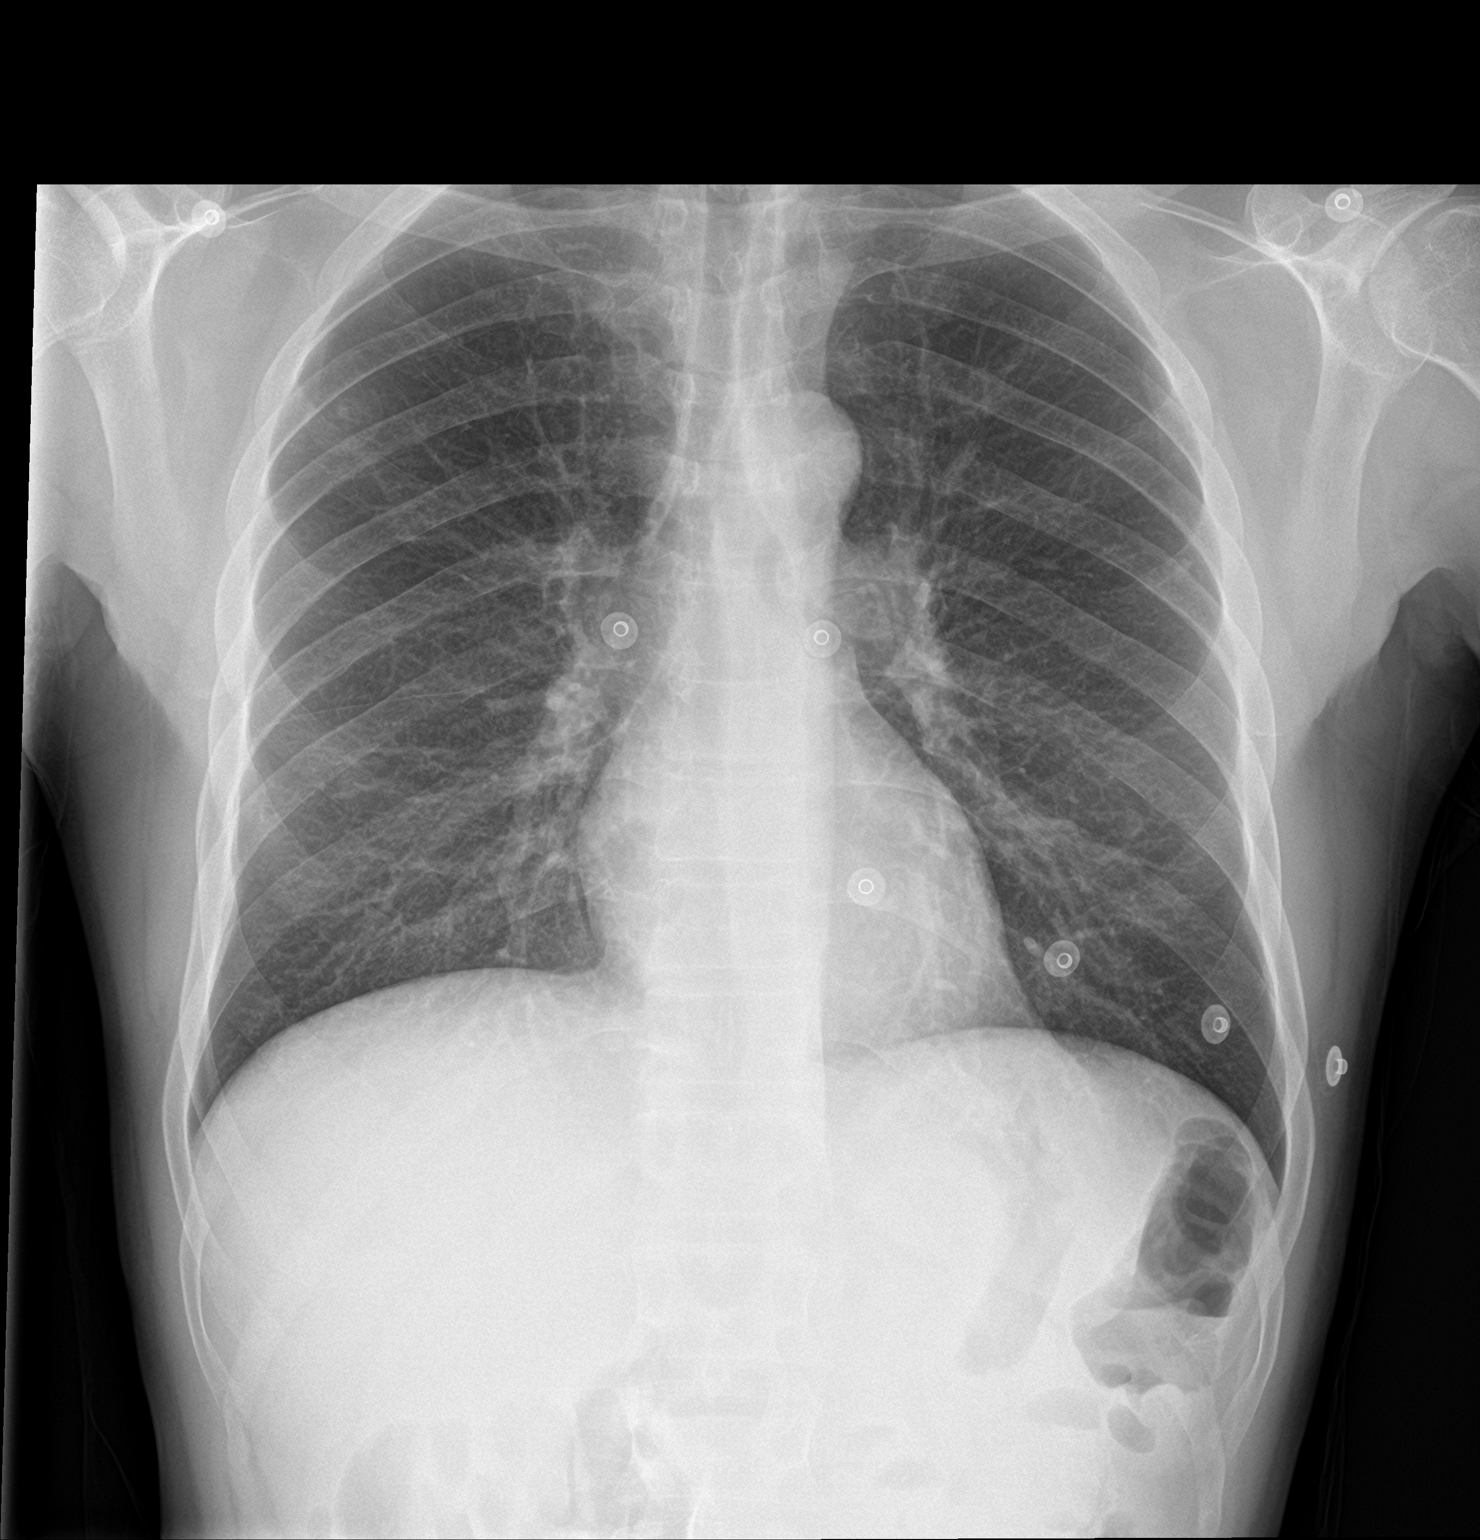

[chest lat]
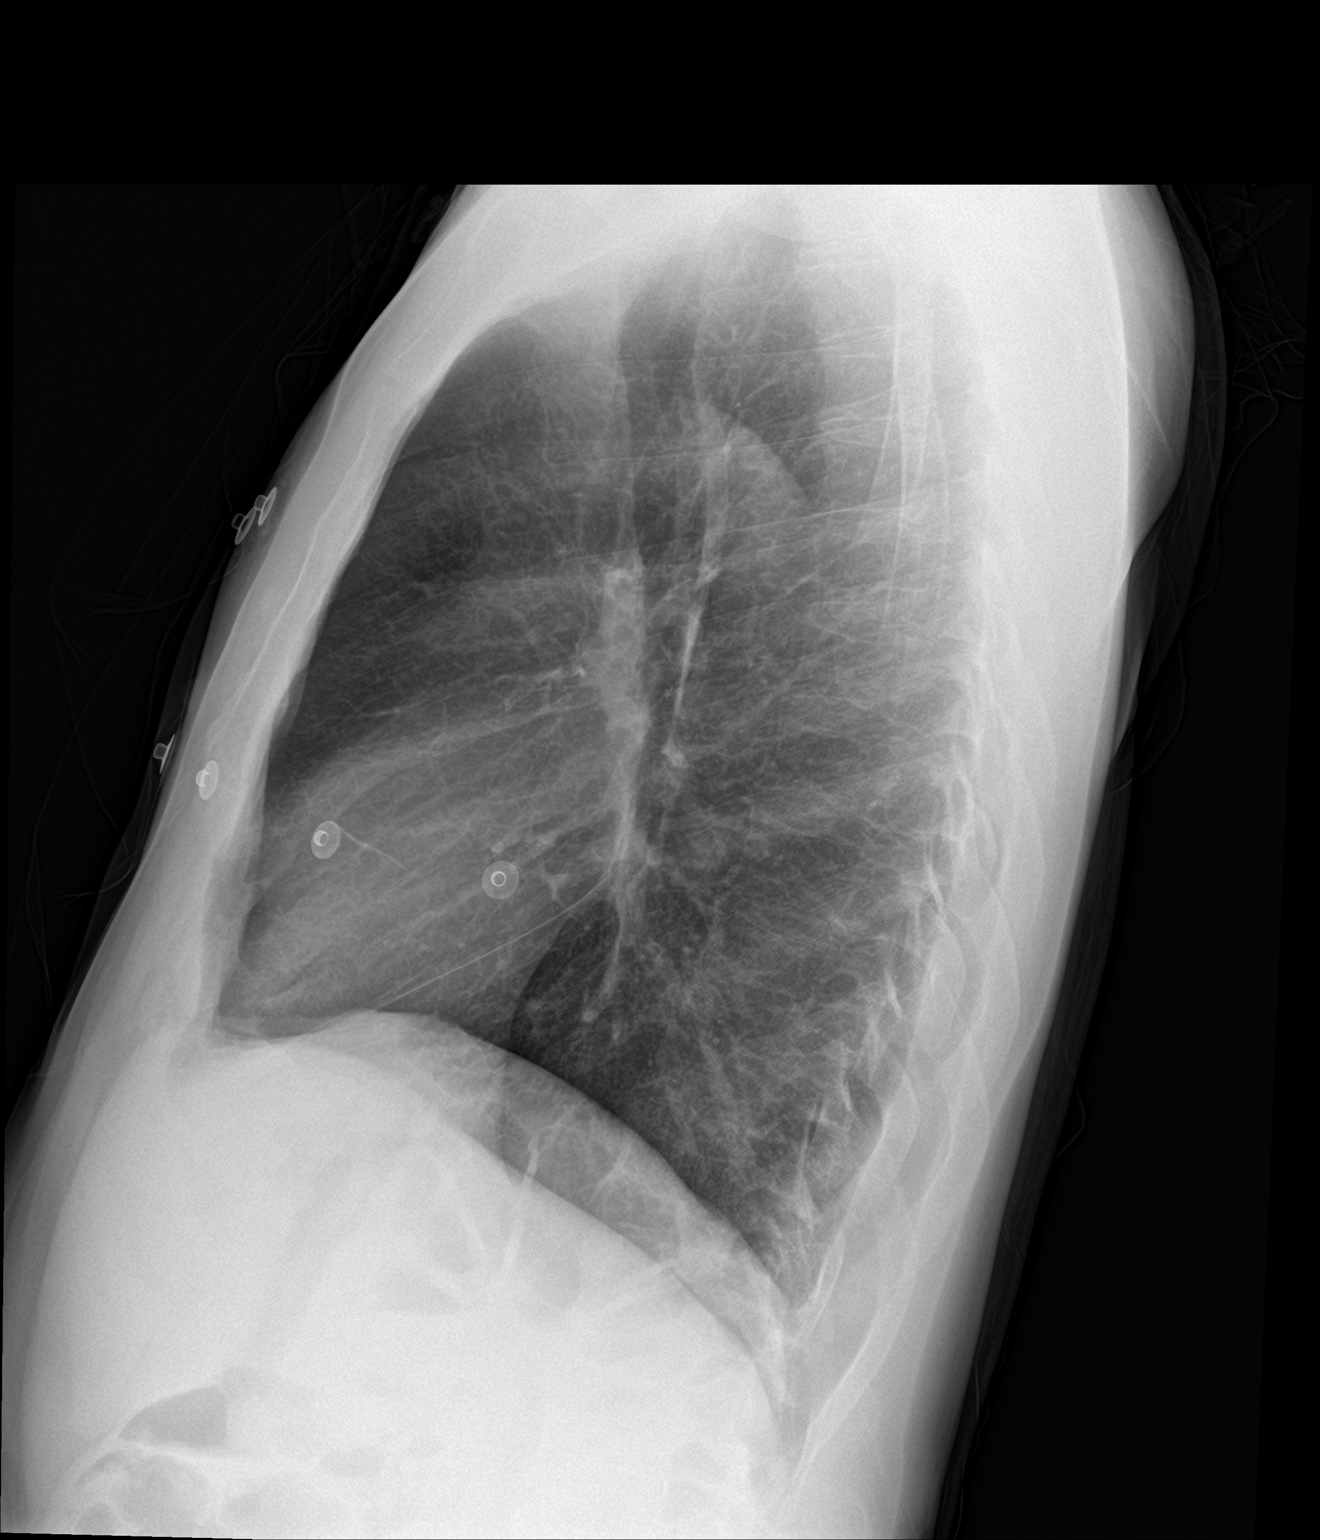

[2 of 2 positions shown; findings below may reference images not displayed]

FINDINGS: Cardiac silhouette is normal in size. Normal mediastinal and hilar
contours.

Clear lungs.  No pleural effusion or pneumothorax.

Skeletal structures are intact.
IMPRESSION: No active cardiopulmonary disease.

## 2023-11-27 NOTE — Transitions of Care (Post Inpatient/ED Visit) (Unsigned)
   11/27/2023  Name: [name] MRN: [id number] DOB: [birth year]  Today's TOC FU Call Status: Today's TOC FU Call Status:: Unsuccessful Call (2nd Attempt) Unsuccessful Call (1st Attempt) Date: 11/24/23 Unsuccessful Call (2nd Attempt) Date: 11/27/23  Attempted to reach the patient regarding the most recent Inpatient/ED visit.  Follow Up Plan: Additional outreach attempts will be made to reach the patient to complete the Transitions of Care (Post Inpatient/ED visit) call.   Signature Julian Lemmings, LPN [facility] Nurse Health Advisor Direct Dial [phone]

## 2023-11-28 ENCOUNTER — Emergency Department (HOSPITAL_COMMUNITY): Payer: MEDICAID

## 2023-11-28 ENCOUNTER — Emergency Department (HOSPITAL_COMMUNITY)
Admission: EM | Admit: 2023-11-28 | Discharge: 2023-11-28 | Disposition: A | Discharge status: Home / Self Care | Payer: MEDICAID | Attending: Emergency Medicine | Admitting: Emergency Medicine

## 2023-11-28 ENCOUNTER — Encounter (HOSPITAL_COMMUNITY): Payer: Self-pay

## 2023-11-28 ENCOUNTER — Other Ambulatory Visit: Payer: Self-pay

## 2023-11-28 DIAGNOSIS — Z91040 Latex allergy status: Secondary | ICD-10-CM | POA: Insufficient documentation

## 2023-11-28 DIAGNOSIS — Z79899 Other long term (current) drug therapy: Secondary | ICD-10-CM | POA: Insufficient documentation

## 2023-11-28 DIAGNOSIS — I10 Essential (primary) hypertension: Secondary | ICD-10-CM | POA: Insufficient documentation

## 2023-11-28 DIAGNOSIS — K860 Alcohol-induced chronic pancreatitis: Secondary | ICD-10-CM | POA: Diagnosis not present

## 2023-11-28 DIAGNOSIS — R079 Chest pain, unspecified: Secondary | ICD-10-CM | POA: Diagnosis present

## 2023-11-28 LAB — COMPREHENSIVE METABOLIC PANEL WITH GFR
ALT: 25 U/L (ref 0–44)
AST: 47 U/L — ABNORMAL HIGH (ref 15–41)
Albumin: 3.8 g/dL (ref 3.5–5.0)
Alkaline Phosphatase: 67 U/L (ref 38–126)
Anion gap: 16 — ABNORMAL HIGH (ref 5–15)
BUN: 7 mg/dL (ref 6–20)
CO2: 17 mmol/L — ABNORMAL LOW (ref 22–32)
Calcium: 7.7 mg/dL — ABNORMAL LOW (ref 8.9–10.3)
Chloride: 109 mmol/L (ref 98–111)
Creatinine, Ser: 1.13 mg/dL (ref 0.61–1.24)
GFR, Estimated: >60 mL/min (ref >60)
Glucose, Bld: 87 mg/dL (ref 70–99)
Potassium: 4.1 mmol/L (ref 3.5–5.1)
Sodium: 142 mmol/L (ref 135–145)
Total Bilirubin: 0.7 mg/dL (ref 0.0–1.2)
Total Protein: 7.2 g/dL (ref 6.5–8.1)

## 2023-11-28 LAB — CBC
HCT: 37.2 % — ABNORMAL LOW (ref 39.0–52.0)
Hemoglobin: 11.5 g/dL — ABNORMAL LOW (ref 13.0–17.0)
MCH: 29.9 pg (ref 26.0–34.0)
MCHC: 30.9 g/dL (ref 30.0–36.0)
MCV: 96.9 fL (ref 80.0–100.0)
Platelets: 185 K/uL (ref 150–400)
RBC: 3.84 MIL/uL — ABNORMAL LOW (ref 4.22–5.81)
RDW: 15.7 % — ABNORMAL HIGH (ref 11.5–15.5)
WBC: 5.3 K/uL (ref 4.0–10.5)
nRBC: 0 % (ref 0.0–0.2)

## 2023-11-28 LAB — TROPONIN I (HIGH SENSITIVITY)
Troponin I (High Sensitivity): 4 ng/L (ref <18)
Troponin I (High Sensitivity): 5 ng/L (ref <18)

## 2023-11-28 LAB — LIPASE, BLOOD: Lipase: 25 U/L (ref 11–51)

## 2023-11-28 MED ORDER — ONDANSETRON HCL 4 MG/2ML IJ SOLN
4.0000 mg | Freq: Once | INTRAMUSCULAR | Status: AC
  Administered 2023-11-28: 4 mg via INTRAVENOUS
  Filled 2023-11-28: qty 2

## 2023-11-28 MED ORDER — LIDOCAINE VISCOUS HCL 2 % MT SOLN
15.0000 mL | Freq: Once | OROMUCOSAL | Status: AC
  Administered 2023-11-28: 15 mL via OROMUCOSAL
  Filled 2023-11-28: qty 15

## 2023-11-28 MED ORDER — ALUM & MAG HYDROXIDE-SIMETH 200-200-20 MG/5ML PO SUSP
30.0000 mL | Freq: Once | ORAL | Status: AC
  Administered 2023-11-28: 30 mL via ORAL
  Filled 2023-11-28: qty 30

## 2023-11-28 MED ORDER — MORPHINE SULFATE (PF) 4 MG/ML IV SOLN
4.0000 mg | Freq: Once | INTRAVENOUS | Status: AC
  Administered 2023-11-28: 4 mg via INTRAVENOUS
  Filled 2023-11-28: qty 1

## 2023-11-28 MED ORDER — OXYCODONE-ACETAMINOPHEN 5-325 MG PO TABS: 1.0000 | ORAL_TABLET | ORAL | 0 refills | Status: DC | PRN

## 2023-11-28 NOTE — ED Provider Notes (Signed)
 [place] EMERGENCY DEPARTMENT AT [facility] Provider Note   CSN: [id number] Arrival date & time: 11/28/23  1525     Patient presents with: Chest Pain   [name] is a 53 y.o. male.   The history is provided by the patient and medical records.  Chest Pain  53 year old male with history of chronic pancreatitis, alcohol  abuse, GERD, hypertension, presenting to the ED for chest and abdominal pain.  Feels like he has pancreatitis again.  Admits he drank some beer yesterday but denies any alcohol  use today.  He denies drug use.  Was in the hospital last week, only has 1 oxycodone  tablet left.  Tried to take a dose earlier today but threw up.  He was referred to pain management, however informed they do not accept his insurance and would not be eligible to pay out of pocket.  Reports vomiting and diarrhea.  No fever/chills.  No SOB.  Prior to Admission medications   Medication Sig Start Date End Date Taking? Authorizing Provider  acetaminophen  (TYLENOL ) 500 MG tablet Take 1,000 mg by mouth daily as needed for mild pain (pain score 1-3) or moderate pain (pain score 4-6).   Yes [provider]  carvedilol  (COREG ) 25 MG tablet Take 1 tablet (25 mg total) by mouth 2 (two) times daily with a meal. 11/13/23  Yes Amin, Ankit C, MD  folic acid  (FOLVITE ) 1 MG tablet Take 1 tablet (1 mg total) by mouth daily. 11/23/23 12/23/23 Yes Pahwani, Fredia, MD  lidocaine  (XYLOCAINE ) 2 % solution Use as directed 15 mLs in the mouth or throat every 6 (six) hours as needed for mouth pain. 11/23/23  Yes Vernon Fredia, MD  lipase/protease/amylase (CREON ) 36000 UNITS CPEP capsule Take 2 capsules (72,000 Units total) by mouth 3 (three) times daily with meals. May also take 1 capsule (36,000 Units total) as needed (with snacks). 08/21/23  Yes Ghimire, Donalda HERO, MD  loperamide  (IMODIUM  A-D) 2 MG tablet Take 2 mg by mouth 4 (four) times daily as needed for diarrhea or loose stools.   Yes [provider]  Multiple Vitamin (MULTIVITAMIN WITH MINERALS) TABS tablet Take 1 tablet by mouth daily. 08/21/23  Yes Ghimire, Donalda HERO, MD  nicotine  (NICODERM CQ  - DOSED IN MG/24 HOURS) 21 mg/24hr patch Place 1 patch (21 mg total) onto the skin daily. Patient taking differently: Place 21 mg onto the skin daily as needed (nicotine  craving). 11/14/23  Yes Amin, Ankit C, MD  ondansetron  (ZOFRAN -ODT) 4 MG disintegrating tablet Take 1 tablet (4 mg total) by mouth every 8 (eight) hours as needed. 11/23/23  Yes Pahwani, Fredia, MD  oxyCODONE  (OXY IR/ROXICODONE ) 5 MG immediate release tablet Take 1 tablet (5 mg total) by mouth every 6 (six) hours as needed for moderate pain (pain score 4-6) or breakthrough pain. Patient taking differently: Take 10 mg by mouth every 6 (six) hours as needed for moderate pain (pain score 4-6) or breakthrough pain. 11/23/23  Yes Pahwani, Fredia, MD  oxyCODONE -acetaminophen  (PERCOCET) 5-325 MG tablet Take 1 tablet by mouth every 4 (four) hours as needed. 11/28/23  Yes Jarold Olam HERO, PA-C  pantoprazole  (PROTONIX ) 20 MG tablet Take 1 tablet (20 mg total) by mouth daily before breakfast. 11/13/23  Yes Amin, Ankit C, MD  sucralfate  (CARAFATE ) 1 g tablet Take 1 g by mouth 3 (three) times daily with meals.   Yes [provider]  tamsulosin  (FLOMAX ) 0.4 MG CAPS capsule Take 1 capsule (0.4 mg total) by mouth daily. Patient  taking differently: Take 0.4 mg by mouth daily as needed (for difficulty urinating). 08/21/23  Yes Ghimire, Donalda HERO, MD  thiamine  (VITAMIN B1) 100 MG tablet Take 1 tablet (100 mg total) by mouth daily. 08/21/23  Yes Ghimire, Donalda HERO, MD  famotidine  (PEPCID ) 40 MG tablet Take 1 tablet (40 mg total) by mouth daily. Patient not taking: Reported on 11/28/2023 10/17/23   Henderly, Britni A, PA-C  magnesium  oxide (MAG-OX) 400 (240 Mg) MG tablet Take 1 tablet (400 mg total) by mouth daily. Patient not taking: Reported on 11/28/2023 11/07/23   Hoy Nidia FALCON, PA-C   polyethylene glycol powder (GLYCOLAX /MIRALAX ) 17 GM/SCOOP powder Take 17 g by mouth daily as needed for mild constipation. Patient not taking: Reported on 11/28/2023 11/13/23   Caleen Burgess BROCKS, MD  amitriptyline  (ELAVIL ) 25 MG tablet Take 1 tablet (25 mg total) by mouth at bedtime. Patient not taking: Reported on 08/08/2019 10/15/18 08/08/19  Tobie Yetta HERO, MD    Allergies: Robaxin  [methocarbamol ], Bayer aspirin  [aspirin ], Trazodone  and nefazodone, Adhesive [tape], Latex, Toradol  [ketorolac  tromethamine ], Contrast media [iodinated contrast media], Fish allergy, Reglan  [metoclopramide ], Salmon [fish oil], and Shellfish allergy    Review of Systems  Cardiovascular:  Positive for chest pain.  All other systems reviewed and are negative.   Updated Vital Signs BP (!) 159/105 (BP Location: Right Arm)   Pulse 91   Temp 98.8 F (37.1 C)   Resp 15   Ht 5' 8 (1.727 m)   Wt 56.7 kg   SpO2 100%   BMI 19.01 kg/m   Physical Exam Vitals and nursing note reviewed.  Constitutional:      Appearance: He is well-developed.  HENT:     Head: Normocephalic and atraumatic.  Eyes:     Conjunctiva/sclera: Conjunctivae normal.     Pupils: Pupils are equal, round, and reactive to light.  Cardiovascular:     Rate and Rhythm: Normal rate and regular rhythm.     Heart sounds: Normal heart sounds.  Pulmonary:     Effort: Pulmonary effort is normal.     Breath sounds: Normal breath sounds.  Chest:     Comments: Zio patch left chest Abdominal:     General: Bowel sounds are normal.     Palpations: Abdomen is soft.  Musculoskeletal:        General: Normal range of motion.     Cervical back: Normal range of motion.  Skin:    General: Skin is warm and dry.  Neurological:     Mental Status: He is alert and oriented to person, place, and time.     (all labs ordered are listed, but only abnormal results are displayed) Labs Reviewed  CBC - Abnormal; Notable for the following components:      Result Value    RBC 3.84 (*)    Hemoglobin 11.5 (*)    HCT 37.2 (*)    RDW 15.7 (*)    All other components within normal limits  COMPREHENSIVE METABOLIC PANEL WITH GFR - Abnormal; Notable for the following components:   CO2 17 (*)    Calcium  7.7 (*)    AST 47 (*)    Anion gap 16 (*)    All other components within normal limits  LIPASE, BLOOD  TROPONIN I (HIGH SENSITIVITY)  TROPONIN I (HIGH SENSITIVITY)    EKG: EKG Interpretation Date/Time:  Tuesday November 28 2023 16:18:20 EDT Ventricular Rate:  75 PR Interval:  100 QRS Duration:  74 QT Interval:  358 QTC Calculation:  399 R Axis:   68  Text Interpretation: Sinus rhythm with short PR ST & T wave abnormality, consider inferolateral ischemia Abnormal ECG When compared with ECG of 21-Nov-2023 17:00, No significant change since last tracing Confirmed by Dreama Longs ([id number]) on 11/28/2023 9:06:15 PM  Radiology: [facility] Chest 2 View Result Date: 11/28/2023 CLINICAL DATA:  Chest pain. EXAM: CHEST - 2 VIEW COMPARISON:  November 21, 2023. FINDINGS: The heart size and mediastinal contours are within normal limits. Both lungs are clear. The visualized skeletal structures are unremarkable. IMPRESSION: No active cardiopulmonary disease. Electronically Signed   By: Lynwood Landy Raddle M.D.   On: 11/28/2023 16:13     Procedures   Medications Ordered in the ED  morphine  (PF) 4 MG/ML injection 4 mg (4 mg Intravenous Given 11/28/23 2252)  ondansetron  (ZOFRAN ) injection 4 mg (4 mg Intravenous Given 11/28/23 2252)  alum & mag hydroxide-simeth (MAALOX/MYLANTA) 200-200-20 MG/5ML suspension 30 mL (30 mLs Oral Given 11/28/23 2252)  lidocaine  (XYLOCAINE ) 2 % viscous mouth solution 15 mL (15 mLs Mouth/Throat Given 11/28/23 2252)                                    Medical Decision Making Amount and/or Complexity of Data Reviewed Radiology: ordered and independent interpretation performed. ECG/medicine tests: ordered and independent interpretation performed.  Risk OTC  drugs. Prescription drug management.   53 year old male presenting to the ED with chest and abdominal pain.  History of chronic pancreatitis.  Admits to drinking alcohol  yesterday.  He is afebrile and nontoxic in appearance.  He actually appears at his baseline compared with numerous prior ED visits.  EKG is nonischemic.  His labs are reassuring without leukocytosis or electrolyte derangement.  His lipase is normal.  Troponin x 2 is also negative.  Chest x-ray is clear.  Suspect this is exacerbation of his chronic pancreatitis/chronic pain.  Will give one time dose of meds here in the ED.  He has already been referred to pain management, however cannot be seen at that office due to his insurance.  He was kicked out of [facility] pain management previously due to positive drug screen.  Will need to follow-up with PCP to make other arrangements for pain management.  He was given 10 tabs of percocet for home temporarily.  Can return here for new concerns.  Final diagnoses:  Alcohol -induced chronic pancreatitis [facility])    ED Discharge Orders          Ordered    oxyCODONE -acetaminophen  (PERCOCET) 5-325 MG tablet  Every 4 hours PRN        11/28/23 2319               Jarold Olam HERO, PA-C 11/28/23 2321    Dreama Longs, MD 11/30/23 1944

## 2023-11-28 NOTE — Transitions of Care (Post Inpatient/ED Visit) (Unsigned)
 11/28/2023  Name: [name] MRN: [id number] DOB: 1970-10-12  Today's TOC FU Call Status: Today's TOC FU Call Status:: Successful TOC FU Call Completed Unsuccessful Call (1st Attempt) Date: 11/24/23 Unsuccessful Call (2nd Attempt) Date: 11/27/23 [facility] FU Call Complete Date: 11/28/23 Patient's Name and Date of Birth confirmed.  Transition Care Management Follow-up Telephone Call Date of Discharge: 11/23/23 Discharge Facility: [name]) Type of Discharge: Inpatient Admission Primary Inpatient Discharge Diagnosis:: abd pain How have you been since you were released from the hospital?: Better Any questions or concerns?: No  Items Reviewed: Did you receive and understand the discharge instructions provided?: Yes Medications obtained,verified, and reconciled?: Yes (Medications Reviewed) Any new allergies since your discharge?: No Dietary orders reviewed?: Yes Do you have support at home?: Yes People in Home [RPT]: significant other  Medications Reviewed Today: Medications Reviewed Today     Reviewed by Emmitt Pan, LPN (Licensed Practical Nurse) on 11/28/23 at 1409  Med List Status: <None>   Medication Order Taking? Sig Documenting Provider Last Dose Status Informant  acetaminophen  (TYLENOL ) 500 MG tablet [id number] Yes Take 1,000 mg by mouth daily as needed for mild pain (pain score 1-3) or moderate pain (pain score 4-6). [provider]  Active Self, Pharmacy Records   Patient not taking:   Discontinued 08/08/19 2041 carvedilol  (COREG ) 25 MG tablet [id number] Yes Take 1 tablet (25 mg total) by mouth 2 (two) times daily with a meal. Amin, Ankit C, MD  Active Pharmacy Records, Self  famotidine  (PEPCID ) 40 MG tablet [id number] Yes Take 1 tablet (40 mg total) by mouth daily. Henderly, Britni A, PA-C  Active Pharmacy Records, Self  folic acid  (FOLVITE ) 1 MG tablet [id number] Yes Take 1 tablet (1 mg total) by mouth daily. Raenelle Donalda HERO, MD  Active Self,  Pharmacy Records  folic acid  (FOLVITE ) 1 MG tablet [id number] Yes Take 1 tablet (1 mg total) by mouth daily. Vernon Ranks, MD  Active   lidocaine  (XYLOCAINE ) 2 % solution [id number] Yes Use as directed 15 mLs in the mouth or throat every 6 (six) hours as needed for mouth pain. Vernon Ranks, MD  Active   lipase/protease/amylase (CREON ) 36000 UNITS CPEP capsule [id number] Yes Take 2 capsules (72,000 Units total) by mouth 3 (three) times daily with meals. May also take 1 capsule (36,000 Units total) as needed (with snacks). Raenelle Donalda HERO, MD  Active Self, Pharmacy Records  loperamide  (IMODIUM  A-D) 2 MG tablet [id number] Yes Take 2 mg by mouth 4 (four) times daily as needed for diarrhea or loose stools. [provider]  Active Pharmacy Records, Self  magnesium  oxide (MAG-OX) 400 (240 Mg) MG tablet [id number] Yes Take 1 tablet (400 mg total) by mouth daily. Hoy Nidia FALCON, PA-C  Active Pharmacy Records, Self  Multiple Vitamin (MULTIVITAMIN WITH MINERALS) TABS tablet [id number] Yes Take 1 tablet by mouth daily. Raenelle Donalda HERO, MD  Active Self, Pharmacy Records  nicotine  (NICODERM CQ  - DOSED IN MG/24 HOURS) 21 mg/24hr patch [id number] Yes Place 1 patch (21 mg total) onto the skin daily.  Patient taking differently: Place 21 mg onto the skin daily as needed (nicotine  craving).   Caleen Burgess BROCKS, MD  Active Pharmacy Records, Self  ondansetron  (ZOFRAN -ODT) 4 MG disintegrating tablet [id number] Yes Take 1 tablet (4 mg total) by mouth every 8 (eight) hours as needed. Vernon Ranks, MD  Active   oxyCODONE  (OXY IR/ROXICODONE ) 5 MG immediate release tablet [id number] Yes Take 1 tablet (5 mg total) by mouth every 6 (six)  hours as needed for moderate pain (pain score 4-6) or breakthrough pain. Vernon Ranks, MD  Active   pantoprazole  (PROTONIX ) 20 MG tablet [id number] Yes Take 1 tablet (20 mg total) by mouth daily before breakfast. Caleen Burgess BROCKS, MD  Active Pharmacy Records, Self  polyethylene glycol  powder (GLYCOLAX /MIRALAX ) 17 GM/SCOOP powder [id number] Yes Take 17 g by mouth daily as needed for mild constipation. Caleen Burgess BROCKS, MD  Active Pharmacy Records, Self  sucralfate  (CARAFATE ) 1 g tablet [id number] Yes Take 1 g by mouth 3 (three) times daily as needed (stomach pain). [provider]  Active Pharmacy Records, Self  tamsulosin  (FLOMAX ) 0.4 MG CAPS capsule [id number]  Take 1 capsule (0.4 mg total) by mouth daily.  Patient not taking: Reported on 11/28/2023   Raenelle Donalda HERO, MD  Active Self, Pharmacy Records  thiamine  (VITAMIN B1) 100 MG tablet [id number] Yes Take 1 tablet (100 mg total) by mouth daily. Raenelle Donalda HERO, MD  Active Self, Pharmacy Records            Home Care and Equipment/Supplies: Were Home Health Services Ordered?: NA Any new equipment or medical supplies ordered?: NA  Functional Questionnaire: Do you need assistance with bathing/showering or dressing?: Yes Do you need assistance with meal preparation?: No Do you need assistance with eating?: No Do you have difficulty maintaining continence: No Do you need assistance with getting out of bed/getting out of a chair/moving?: No Do you have difficulty managing or taking your medications?: No  Follow up appointments reviewed: PCP Follow-up appointment confirmed?: No (no avail appt, sent message to staff to schedule) MD Provider Line Number:[phone] Given: No Specialist Hospital Follow-up appointment confirmed?: Yes Date of Specialist follow-up appointment?: 12/08/23 Follow-Up Specialty Provider:: cardio Do you need transportation to your follow-up appointment?: No Do you understand care options if your condition(s) worsen?: Yes-patient verbalized understanding    SIGNATURE Julian Lemmings, LPN [facility] Nurse Health Advisor Direct Dial [phone]

## 2023-11-28 NOTE — ED Triage Notes (Signed)
 Pt here substernal CP that radiates to both arms that started 30 min ago. 1 episode of vomiting. Axox4. Allergic to aspirin .

## 2023-11-28 NOTE — ED Provider Triage Note (Signed)
 Emergency Medicine Provider Triage Evaluation Note  [name] , a 53 y.o. male  was evaluated in triage.  Pt complains of chest pain, epigastric pain with radiation to both arms that started around 30 minutes ago, 1 episode of vomiting.  Patient with history of chronic pancreatitis, alcohol  abuse, he is seen frequently in the emergency department.  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  Additionally with history of Wolff-Parkinson-White.  Review of Systems  Positive: Chest pain, abdominal pain, nausea, vomiting Negative: Shortness of breath  Physical Exam  Ht 5' 8 (1.727 m)   Wt 56.7 kg   BMI 19.01 kg/m  Gen:   Awake, no distress   Resp:  Normal effort  MSK:   Moves extremities without difficulty  Other:  Focally tender in epigastric region, no rebound, rigidity, guarding  Medical Decision Making  Medically screening exam initiated at 3:55 PM.  Appropriate orders placed.  [name] was informed that the remainder of the evaluation will be completed by another provider, this initial triage assessment does not replace that evaluation, and the importance of remaining in the ED until their evaluation is complete.  Workup initiated in triage    [name], [place] 11/28/23 1557

## 2023-11-28 NOTE — Discharge Instructions (Addendum)
 Take the prescribed medication as directed.  Continue all your other medications. Follow-up with your your primary care doctor. Return to the ED for new or worsening symptoms.

## 2023-11-30 IMAGING — DX DG ABDOMEN 1V
1 series · 1 of 1 positions shown · non-contrast
Comparison: CT from 06/06/2021

CLINICAL DATA: Generalized abdominal pain common known pancreatitis

EXAM:
ABDOMEN - 1 VIEW

[abdomen]
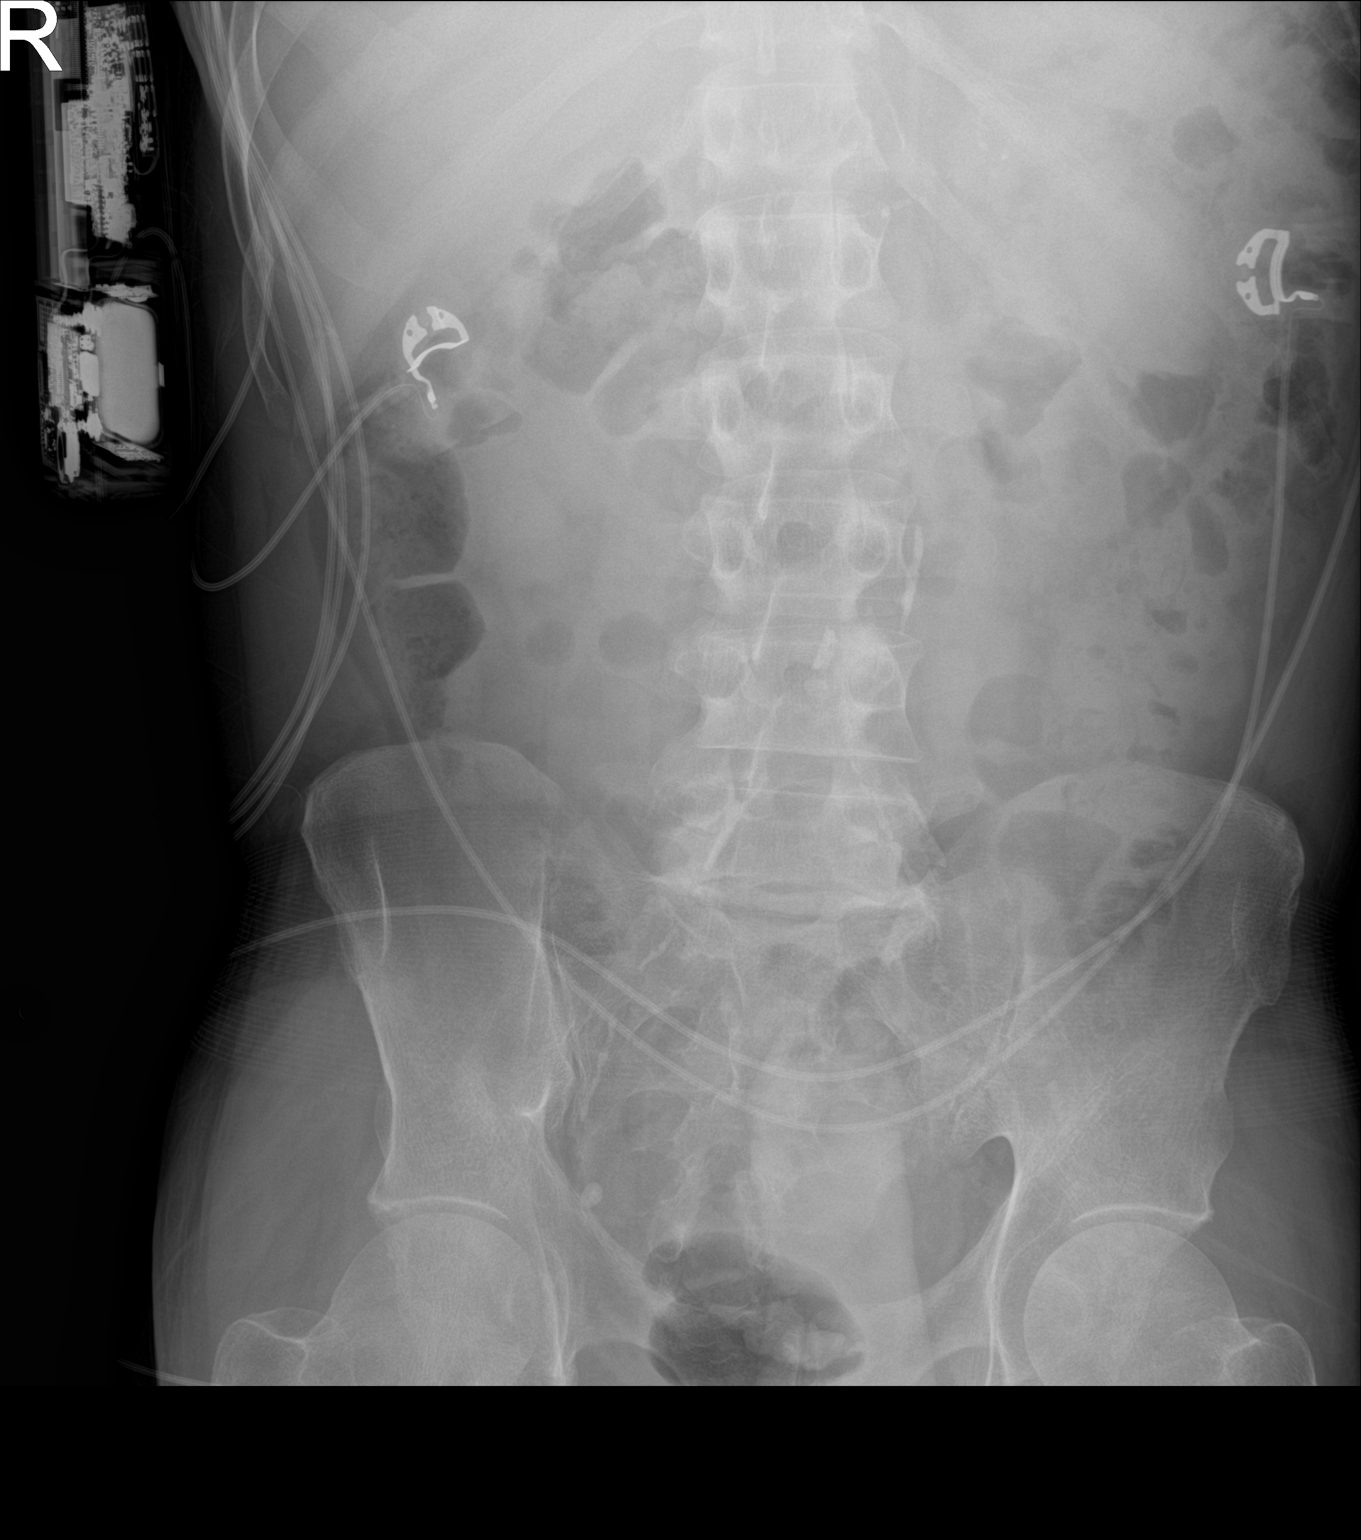

[1 of 1 positions shown; findings below may reference images not displayed]

FINDINGS: Scattered large and small bowel gas is noted. No obstructive changes
are seen. No free air is noted. Scattered calcifications in the
pancreas are again noted. No bony abnormality is seen.
IMPRESSION: No acute abnormality noted.

## 2023-12-02 ENCOUNTER — Emergency Department (HOSPITAL_COMMUNITY): Payer: MEDICAID

## 2023-12-02 ENCOUNTER — Other Ambulatory Visit: Payer: Self-pay

## 2023-12-02 ENCOUNTER — Encounter (HOSPITAL_COMMUNITY): Payer: Self-pay

## 2023-12-02 ENCOUNTER — Emergency Department (HOSPITAL_COMMUNITY)
Admission: EM | Admit: 2023-12-02 | Discharge: 2023-12-02 | Disposition: A | Discharge status: Home / Self Care | Payer: MEDICAID | Attending: Emergency Medicine | Admitting: Emergency Medicine

## 2023-12-02 DIAGNOSIS — M546 Pain in thoracic spine: Secondary | ICD-10-CM | POA: Insufficient documentation

## 2023-12-02 DIAGNOSIS — R1013 Epigastric pain: Secondary | ICD-10-CM | POA: Insufficient documentation

## 2023-12-02 DIAGNOSIS — M25511 Pain in right shoulder: Secondary | ICD-10-CM | POA: Insufficient documentation

## 2023-12-02 DIAGNOSIS — K861 Other chronic pancreatitis: Secondary | ICD-10-CM

## 2023-12-02 DIAGNOSIS — Z91040 Latex allergy status: Secondary | ICD-10-CM | POA: Insufficient documentation

## 2023-12-02 LAB — BASIC METABOLIC PANEL WITH GFR
Anion gap: 8 (ref 5–15)
BUN: 12 mg/dL (ref 6–20)
CO2: 19 mmol/L — ABNORMAL LOW (ref 22–32)
Calcium: 8.2 mg/dL — ABNORMAL LOW (ref 8.9–10.3)
Chloride: 110 mmol/L (ref 98–111)
Creatinine, Ser: 0.76 mg/dL (ref 0.61–1.24)
GFR, Estimated: >60 mL/min (ref >60)
Glucose, Bld: 103 mg/dL — ABNORMAL HIGH (ref 70–99)
Potassium: 3.7 mmol/L (ref 3.5–5.1)
Sodium: 137 mmol/L (ref 135–145)

## 2023-12-02 LAB — HEPATIC FUNCTION PANEL
ALT: 28 U/L (ref 0–44)
AST: 51 U/L — ABNORMAL HIGH (ref 15–41)
Albumin: 3.7 g/dL (ref 3.5–5.0)
Alkaline Phosphatase: 66 U/L (ref 38–126)
Bilirubin, Direct: 0.1 mg/dL (ref 0.0–0.2)
Indirect Bilirubin: 0.7 mg/dL (ref 0.3–0.9)
Total Bilirubin: 0.8 mg/dL (ref 0.0–1.2)
Total Protein: 6.9 g/dL (ref 6.5–8.1)

## 2023-12-02 LAB — LIPASE, BLOOD: Lipase: 31 U/L (ref 11–51)

## 2023-12-02 LAB — TROPONIN I (HIGH SENSITIVITY): Troponin I (High Sensitivity): 6 ng/L (ref <18)

## 2023-12-02 LAB — CBC
HCT: 32 % — ABNORMAL LOW (ref 39.0–52.0)
Hemoglobin: 10.5 g/dL — ABNORMAL LOW (ref 13.0–17.0)
MCH: 30.1 pg (ref 26.0–34.0)
MCHC: 32.8 g/dL (ref 30.0–36.0)
MCV: 91.7 fL (ref 80.0–100.0)
Platelets: 207 K/uL (ref 150–400)
RBC: 3.49 MIL/uL — ABNORMAL LOW (ref 4.22–5.81)
RDW: 15.6 % — ABNORMAL HIGH (ref 11.5–15.5)
WBC: 8.4 K/uL (ref 4.0–10.5)
nRBC: 0 % (ref 0.0–0.2)

## 2023-12-02 MED ORDER — DIPHENHYDRAMINE HCL 50 MG/ML IJ SOLN
50.0000 mg | Freq: Once | INTRAMUSCULAR | Status: AC
  Administered 2023-12-02: 50 mg via INTRAVENOUS
  Filled 2023-12-02: qty 1

## 2023-12-02 MED ORDER — ONDANSETRON HCL 4 MG/2ML IJ SOLN
4.0000 mg | Freq: Once | INTRAMUSCULAR | Status: AC
  Administered 2023-12-02: 4 mg via INTRAVENOUS
  Filled 2023-12-02: qty 2

## 2023-12-02 MED ORDER — IOHEXOL 350 MG/ML SOLN
100.0000 mL | Freq: Once | INTRAVENOUS | Status: AC | PRN
  Administered 2023-12-02: 100 mL via INTRAVENOUS

## 2023-12-02 MED ORDER — MORPHINE SULFATE 15 MG PO TABS
15.0000 mg | ORAL_TABLET | ORAL | Status: DC | PRN
  Administered 2023-12-02: 15 mg via ORAL
  Filled 2023-12-02: qty 1

## 2023-12-02 MED ORDER — PANTOPRAZOLE SODIUM 40 MG IV SOLR
40.0000 mg | Freq: Once | INTRAVENOUS | Status: AC
  Administered 2023-12-02: 40 mg via INTRAVENOUS
  Filled 2023-12-02: qty 10

## 2023-12-02 MED ORDER — HYDROMORPHONE HCL 1 MG/ML IJ SOLN
1.0000 mg | Freq: Once | INTRAMUSCULAR | Status: AC
  Administered 2023-12-02: 1 mg via INTRAVENOUS
  Filled 2023-12-02: qty 1

## 2023-12-02 MED ORDER — LIDOCAINE VISCOUS HCL 2 % MT SOLN
15.0000 mL | Freq: Four times a day (QID) | OROMUCOSAL | 0 refills | Status: DC | PRN
  Filled 2023-12-02 – 2023-12-04 (×2): qty 100, 2d supply, fill #0

## 2023-12-02 MED ORDER — METHYLPREDNISOLONE SODIUM SUCC 40 MG IJ SOLR
40.0000 mg | Freq: Once | INTRAMUSCULAR | Status: AC
  Administered 2023-12-02: 40 mg via INTRAVENOUS
  Filled 2023-12-02: qty 1

## 2023-12-02 MED ORDER — ALUM & MAG HYDROXIDE-SIMETH 200-200-20 MG/5ML PO SUSP
30.0000 mL | Freq: Once | ORAL | Status: AC
  Administered 2023-12-02: 30 mL via ORAL
  Filled 2023-12-02: qty 30

## 2023-12-02 MED ORDER — PANTOPRAZOLE SODIUM 40 MG PO TBEC
40.0000 mg | DELAYED_RELEASE_TABLET | Freq: Every day | ORAL | 0 refills | Status: DC
Start: 2023-12-02 — End: 2023-12-21
  Filled 2023-12-02 – 2023-12-04 (×2): qty 30, 30d supply, fill #0

## 2023-12-02 MED ORDER — LIDOCAINE VISCOUS HCL 2 % MT SOLN
15.0000 mL | Freq: Once | OROMUCOSAL | Status: AC
  Administered 2023-12-02: 15 mL via OROMUCOSAL
  Filled 2023-12-02: qty 15

## 2023-12-02 MED ORDER — DIPHENHYDRAMINE HCL 25 MG PO CAPS: 50.0000 mg | ORAL_CAPSULE | Freq: Once | ORAL | Status: AC

## 2023-12-02 NOTE — ED Triage Notes (Signed)
 Pt comes via [facility] for CP that started around 1am, radiates to his back and neck. PTA received 100 mcg of fentanyl 

## 2023-12-02 NOTE — ED Notes (Signed)
 Patient transported to CT

## 2023-12-02 NOTE — ED Provider Notes (Signed)
  EMERGENCY DEPARTMENT AT [facility] Provider Note   CSN: [id number] Arrival date & time: 12/02/23  9472     Patient presents with: Chest Pain   [name] is a 53 y.o. male.    [name] is a 53 y.o. male with medical history significant for cocaine abuse, alcohol  abuse, chronic pancreatitis, WPW syndrome status post ablation, and paroxysmal atrial fibrillation.  Presents with right scapular pain that woke her from sleep about 1 hour ago.  Has never had this kind of pain before.  He describes severe pain to his right upper back that radiates down to his mid abdomen.  Before he went to sleep last night he had some chest pressure but this is since resolved.  Over the past several days he has had worsening of his chronic pancreatitis with epigastric pain with nausea and vomiting with some blood streaks.  He has 1 dose of Percocet left.  He has never had pain before to his right upper back like he is having now.  This chest pain has resolved.  He had some brief pain to his right leg which has resolved as well.  Patient he is not having chest pain currently he is having epigastric pain with nausea and vomiting.  The history is provided by the patient and the EMS personnel.  Chest Pain Associated symptoms: abdominal pain, back pain, nausea and vomiting   Associated symptoms: no dizziness, no fever, no headache, no shortness of breath and no weakness        Prior to Admission medications   Medication Sig Start Date End Date Taking? Authorizing Provider  acetaminophen  (TYLENOL ) 500 MG tablet Take 1,000 mg by mouth daily as needed for mild pain (pain score 1-3) or moderate pain (pain score 4-6).    [provider]  carvedilol  (COREG ) 25 MG tablet Take 1 tablet (25 mg total) by mouth 2 (two) times daily with a meal. 11/13/23   Amin, Ankit C, MD  famotidine  (PEPCID ) 40 MG tablet Take 1 tablet (40 mg total) by mouth daily. Patient not taking:  Reported on 11/28/2023 10/17/23   Henderly, Britni A, PA-C  folic acid  (FOLVITE ) 1 MG tablet Take 1 tablet (1 mg total) by mouth daily. 11/23/23 12/23/23  Vernon Ranks, MD  lidocaine  (XYLOCAINE ) 2 % solution Use as directed 15 mLs in the mouth or throat every 6 (six) hours as needed for mouth pain. 11/23/23   Vernon Ranks, MD  lipase/protease/amylase (CREON ) 36000 UNITS CPEP capsule Take 2 capsules (72,000 Units total) by mouth 3 (three) times daily with meals. May also take 1 capsule (36,000 Units total) as needed (with snacks). 08/21/23   Ghimire, Donalda HERO, MD  loperamide  (IMODIUM  A-D) 2 MG tablet Take 2 mg by mouth 4 (four) times daily as needed for diarrhea or loose stools.    [provider]  magnesium  oxide (MAG-OX) 400 (240 Mg) MG tablet Take 1 tablet (400 mg total) by mouth daily. Patient not taking: Reported on 11/28/2023 11/07/23   Hoy Nidia FALCON, PA-C  Multiple Vitamin (MULTIVITAMIN WITH MINERALS) TABS tablet Take 1 tablet by mouth daily. 08/21/23   Ghimire, Donalda HERO, MD  nicotine  (NICODERM CQ  - DOSED IN MG/24 HOURS) 21 mg/24hr patch Place 1 patch (21 mg total) onto the skin daily. Patient taking differently: Place 21 mg onto the skin daily as needed (nicotine  craving). 11/14/23   Amin, Ankit C, MD  ondansetron  (ZOFRAN -ODT) 4 MG disintegrating tablet Take 1 tablet (4 mg  total) by mouth every 8 (eight) hours as needed. 11/23/23   Vernon Ranks, MD  oxyCODONE  (OXY IR/ROXICODONE ) 5 MG immediate release tablet Take 1 tablet (5 mg total) by mouth every 6 (six) hours as needed for moderate pain (pain score 4-6) or breakthrough pain. Patient taking differently: Take 10 mg by mouth every 6 (six) hours as needed for moderate pain (pain score 4-6) or breakthrough pain. 11/23/23   Vernon Ranks, MD  oxyCODONE -acetaminophen  (PERCOCET) 5-325 MG tablet Take 1 tablet by mouth every 4 (four) hours as needed. 11/28/23   Jarold Olam HERO, PA-C  pantoprazole  (PROTONIX ) 20 MG tablet Take 1 tablet (20 mg  total) by mouth daily before breakfast. 11/13/23   Amin, Ankit C, MD  polyethylene glycol powder (GLYCOLAX /MIRALAX ) 17 GM/SCOOP powder Take 17 g by mouth daily as needed for mild constipation. Patient not taking: Reported on 11/28/2023 11/13/23   Caleen Burgess BROCKS, MD  sucralfate  (CARAFATE ) 1 g tablet Take 1 g by mouth 3 (three) times daily with meals.    [provider]  tamsulosin  (FLOMAX ) 0.4 MG CAPS capsule Take 1 capsule (0.4 mg total) by mouth daily. Patient taking differently: Take 0.4 mg by mouth daily as needed (for difficulty urinating). 08/21/23   Ghimire, Donalda HERO, MD  thiamine  (VITAMIN B1) 100 MG tablet Take 1 tablet (100 mg total) by mouth daily. 08/21/23   Ghimire, Donalda HERO, MD  amitriptyline  (ELAVIL ) 25 MG tablet Take 1 tablet (25 mg total) by mouth at bedtime. Patient not taking: Reported on 08/08/2019 10/15/18 08/08/19  Tobie Yetta HERO, MD    Allergies: Robaxin  [methocarbamol ], Bayer aspirin  [aspirin ], Trazodone  and nefazodone, Adhesive [tape], Latex, Toradol  [ketorolac  tromethamine ], Contrast media [iodinated contrast media], Fish allergy, Reglan  [metoclopramide ], Salmon [fish oil], and Shellfish allergy    Review of Systems  Constitutional:  Positive for activity change and appetite change. Negative for fever.  HENT:  Negative for congestion and rhinorrhea.   Respiratory:  Positive for chest tightness. Negative for shortness of breath.   Cardiovascular:  Positive for chest pain.  Gastrointestinal:  Positive for abdominal pain, diarrhea, nausea and vomiting.  Genitourinary:  Negative for dysuria and hematuria.  Musculoskeletal:  Positive for back pain. Negative for arthralgias and myalgias.  Skin:  Negative for rash.  Neurological:  Negative for dizziness, weakness and headaches.   all other systems are negative except as noted in the HPI and PMH.    Updated Vital Signs BP (!) 163/123   Pulse 90   Temp 98.3 F (36.8 C) (Oral)   Resp 16   SpO2 100%   Physical  Exam Vitals and nursing note reviewed.  Constitutional:      General: He is in acute distress.     Appearance: He is well-developed.     Comments: Appears uncomfortable  HENT:     Head: Normocephalic and atraumatic.     Mouth/Throat:     Pharynx: No oropharyngeal exudate.  Eyes:     Conjunctiva/sclera: Conjunctivae normal.     Pupils: Pupils are equal, round, and reactive to light.  Neck:     Comments: No meningismus. Cardiovascular:     Rate and Rhythm: Normal rate and regular rhythm.     Heart sounds: Normal heart sounds. No murmur heard. Pulmonary:     Effort: Pulmonary effort is normal. No respiratory distress.     Breath sounds: Normal breath sounds.  Abdominal:     Palpations: Abdomen is soft.     Tenderness: There is abdominal tenderness. There is  guarding. There is no rebound.     Comments: Epigastric tenderness No lower abdominal tenderness.   Musculoskeletal:        General: Tenderness present. Normal range of motion.     Cervical back: Normal range of motion and neck supple.     Comments: Tender right scapula  Equal DP and PT pulses bilaterally.  Left arm in cast.  Right radial pulse present  Skin:    General: Skin is warm.  Neurological:     Mental Status: He is alert and oriented to person, place, and time.     Cranial Nerves: No cranial nerve deficit.     Motor: No abnormal muscle tone.     Coordination: Coordination normal.     Comments:  5/5 strength throughout. CN 2-12 intact.Equal grip strength.   Psychiatric:        Behavior: Behavior normal.     (all labs ordered are listed, but only abnormal results are displayed) Labs Reviewed  BASIC METABOLIC PANEL WITH GFR  CBC  HEPATIC FUNCTION PANEL  LIPASE, BLOOD  TROPONIN I (HIGH SENSITIVITY)    EKG: None  Radiology: DG Chest 2 View Result Date: 12/02/2023 CLINICAL DATA:  Chest pain EXAM: CHEST - 2 VIEW COMPARISON:  11/28/2023 FINDINGS: The lungs are clear without focal pneumonia, edema,  pneumothorax or pleural effusion. The cardiopericardial silhouette is within normal limits for size. No acute bony abnormality. Telemetry leads overlie the chest. IMPRESSION: No active cardiopulmonary disease. Electronically Signed   By: Camellia Candle M.D.   On: 12/02/2023 06:20     Procedures   Medications Ordered in the ED  HYDROmorphone  (DILAUDID ) injection 1 mg (has no administration in time range)  ondansetron  (ZOFRAN ) injection 4 mg (has no administration in time range)  pantoprazole  (PROTONIX ) injection 40 mg (has no administration in time range)  methylPREDNISolone  sodium succinate (SOLU-MEDROL ) 40 mg/mL injection 40 mg (has no administration in time range)  diphenhydrAMINE  (BENADRYL ) capsule 50 mg (has no administration in time range)    Or  diphenhydrAMINE  (BENADRYL ) injection 50 mg (has no administration in time range)                                    Medical Decision Making Amount and/or Complexity of Data Reviewed Independent Historian: EMS Labs: ordered. Decision-making details documented in ED Course. Radiology: ordered and independent interpretation performed. Decision-making details documented in ED Course. ECG/medicine tests: ordered and independent interpretation performed. Decision-making details documented in ED Course.  Risk OTC drugs. Prescription drug management.   Upper back pain with worsening of epigastric pain and vomiting. Hypertensive and uncomfortable on arrival. EKG without acute ischemia.  States last cocaine use was 6 days ago  Concerned that patient has never had upper back pain like this before with his episodes of nausea and vomiting.  Does have a history of cocaine abuse.  ACS is considered unlikely.  But will trend troponins.  EKG with no acute changes.  Labs reassuring with normal LFTs and lipase.  Troponin negative.  Concern for aortic dissection given his epigastric pain and right upper back pain.  He will need pretreatment for contrast  allergy.  Pain and nausea medications given as well as PPI and IVF.  CT pending at shift change.  Dr. Charlyn to assume care.      Final diagnoses:  None    ED Discharge Orders     None  Carita Senior, MD 12/02/23 [phone]

## 2023-12-03 ENCOUNTER — Emergency Department (HOSPITAL_COMMUNITY): Payer: MEDICAID

## 2023-12-03 ENCOUNTER — Encounter (HOSPITAL_COMMUNITY): Payer: Self-pay

## 2023-12-03 ENCOUNTER — Inpatient Hospital Stay (HOSPITAL_COMMUNITY)
Admission: EM | Admit: 2023-12-03 | Discharge: 2023-12-05 | DRG: 309 | Disposition: A | Discharge status: Home / Self Care | Payer: MEDICAID | Attending: Student | Admitting: Student

## 2023-12-03 ENCOUNTER — Other Ambulatory Visit: Payer: Self-pay

## 2023-12-03 DIAGNOSIS — E7800 Pure hypercholesterolemia, unspecified: Secondary | ICD-10-CM | POA: Diagnosis present

## 2023-12-03 DIAGNOSIS — Z811 Family history of alcohol abuse and dependence: Secondary | ICD-10-CM

## 2023-12-03 DIAGNOSIS — I480 Paroxysmal atrial fibrillation: Principal | ICD-10-CM | POA: Diagnosis present

## 2023-12-03 DIAGNOSIS — K861 Other chronic pancreatitis: Secondary | ICD-10-CM | POA: Diagnosis present

## 2023-12-03 DIAGNOSIS — Z91013 Allergy to seafood: Secondary | ICD-10-CM

## 2023-12-03 DIAGNOSIS — E876 Hypokalemia: Secondary | ICD-10-CM | POA: Diagnosis present

## 2023-12-03 DIAGNOSIS — I2489 Other forms of acute ischemic heart disease: Secondary | ICD-10-CM | POA: Diagnosis present

## 2023-12-03 DIAGNOSIS — F1410 Cocaine abuse, uncomplicated: Secondary | ICD-10-CM | POA: Diagnosis present

## 2023-12-03 DIAGNOSIS — J439 Emphysema, unspecified: Secondary | ICD-10-CM | POA: Diagnosis present

## 2023-12-03 DIAGNOSIS — Z5982 Transportation insecurity: Secondary | ICD-10-CM

## 2023-12-03 DIAGNOSIS — Z886 Allergy status to analgesic agent status: Secondary | ICD-10-CM

## 2023-12-03 DIAGNOSIS — Z8249 Family history of ischemic heart disease and other diseases of the circulatory system: Secondary | ICD-10-CM | POA: Diagnosis not present

## 2023-12-03 DIAGNOSIS — Z91048 Other nonmedicinal substance allergy status: Secondary | ICD-10-CM

## 2023-12-03 DIAGNOSIS — I10 Essential (primary) hypertension: Secondary | ICD-10-CM | POA: Diagnosis present

## 2023-12-03 DIAGNOSIS — I456 Pre-excitation syndrome: Secondary | ICD-10-CM | POA: Diagnosis present

## 2023-12-03 DIAGNOSIS — D573 Sickle-cell trait: Secondary | ICD-10-CM | POA: Diagnosis present

## 2023-12-03 DIAGNOSIS — Z91040 Latex allergy status: Secondary | ICD-10-CM

## 2023-12-03 DIAGNOSIS — I4891 Unspecified atrial fibrillation: Principal | ICD-10-CM | POA: Diagnosis present

## 2023-12-03 DIAGNOSIS — R7989 Other specified abnormal findings of blood chemistry: Secondary | ICD-10-CM | POA: Diagnosis not present

## 2023-12-03 DIAGNOSIS — Z79899 Other long term (current) drug therapy: Secondary | ICD-10-CM

## 2023-12-03 DIAGNOSIS — Z91411 Personal history of adult psychological abuse: Secondary | ICD-10-CM

## 2023-12-03 DIAGNOSIS — N400 Enlarged prostate without lower urinary tract symptoms: Secondary | ICD-10-CM | POA: Diagnosis present

## 2023-12-03 DIAGNOSIS — F17210 Nicotine dependence, cigarettes, uncomplicated: Secondary | ICD-10-CM | POA: Diagnosis present

## 2023-12-03 DIAGNOSIS — Y900 Blood alcohol level of less than 20 mg/100 ml: Secondary | ICD-10-CM | POA: Diagnosis present

## 2023-12-03 DIAGNOSIS — Z604 Social exclusion and rejection: Secondary | ICD-10-CM | POA: Diagnosis present

## 2023-12-03 DIAGNOSIS — F1210 Cannabis abuse, uncomplicated: Secondary | ICD-10-CM | POA: Diagnosis present

## 2023-12-03 DIAGNOSIS — K219 Gastro-esophageal reflux disease without esophagitis: Secondary | ICD-10-CM | POA: Diagnosis present

## 2023-12-03 DIAGNOSIS — F10239 Alcohol dependence with withdrawal, unspecified: Secondary | ICD-10-CM | POA: Diagnosis present

## 2023-12-03 DIAGNOSIS — D509 Iron deficiency anemia, unspecified: Secondary | ICD-10-CM | POA: Diagnosis present

## 2023-12-03 DIAGNOSIS — E8721 Acute metabolic acidosis: Secondary | ICD-10-CM | POA: Diagnosis present

## 2023-12-03 DIAGNOSIS — F10920 Alcohol use, unspecified with intoxication, uncomplicated: Secondary | ICD-10-CM | POA: Diagnosis not present

## 2023-12-03 DIAGNOSIS — Z888 Allergy status to other drugs, medicaments and biological substances status: Secondary | ICD-10-CM

## 2023-12-03 DIAGNOSIS — Z808 Family history of malignant neoplasm of other organs or systems: Secondary | ICD-10-CM

## 2023-12-03 DIAGNOSIS — R54 Age-related physical debility: Secondary | ICD-10-CM | POA: Diagnosis present

## 2023-12-03 DIAGNOSIS — G8929 Other chronic pain: Secondary | ICD-10-CM | POA: Diagnosis present

## 2023-12-03 DIAGNOSIS — F319 Bipolar disorder, unspecified: Secondary | ICD-10-CM | POA: Diagnosis present

## 2023-12-03 DIAGNOSIS — Z8711 Personal history of peptic ulcer disease: Secondary | ICD-10-CM

## 2023-12-03 DIAGNOSIS — F14929 Cocaine use, unspecified with intoxication, unspecified: Secondary | ICD-10-CM | POA: Diagnosis not present

## 2023-12-03 DIAGNOSIS — Z91041 Radiographic dye allergy status: Secondary | ICD-10-CM

## 2023-12-03 LAB — COMPREHENSIVE METABOLIC PANEL WITH GFR
ALT: 26 U/L (ref 0–44)
AST: 47 U/L — ABNORMAL HIGH (ref 15–41)
Albumin: 3.4 g/dL — ABNORMAL LOW (ref 3.5–5.0)
Alkaline Phosphatase: 62 U/L (ref 38–126)
Anion gap: 15 (ref 5–15)
BUN: 10 mg/dL (ref 6–20)
CO2: 19 mmol/L — ABNORMAL LOW (ref 22–32)
Calcium: 8.4 mg/dL — ABNORMAL LOW (ref 8.9–10.3)
Chloride: 105 mmol/L (ref 98–111)
Creatinine, Ser: 1.04 mg/dL (ref 0.61–1.24)
GFR, Estimated: >60 mL/min (ref >60)
Glucose, Bld: 173 mg/dL — ABNORMAL HIGH (ref 70–99)
Potassium: 3.4 mmol/L — ABNORMAL LOW (ref 3.5–5.1)
Sodium: 139 mmol/L (ref 135–145)
Total Bilirubin: 0.6 mg/dL (ref 0.0–1.2)
Total Protein: 6.7 g/dL (ref 6.5–8.1)

## 2023-12-03 LAB — RAPID URINE DRUG SCREEN, HOSP PERFORMED
Amphetamines: NOT DETECTED
Barbiturates: NOT DETECTED
Benzodiazepines: NOT DETECTED
Cocaine: NOT DETECTED
Opiates: NOT DETECTED
Tetrahydrocannabinol: POSITIVE — AB

## 2023-12-03 LAB — TROPONIN I (HIGH SENSITIVITY)
Troponin I (High Sensitivity): 6 ng/L (ref <18)
Troponin I (High Sensitivity): 27 ng/L — ABNORMAL HIGH (ref <18)

## 2023-12-03 LAB — CBC
HCT: 33.7 % — ABNORMAL LOW (ref 39.0–52.0)
Hemoglobin: 10.9 g/dL — ABNORMAL LOW (ref 13.0–17.0)
MCH: 29.6 pg (ref 26.0–34.0)
MCHC: 32.3 g/dL (ref 30.0–36.0)
MCV: 91.6 fL (ref 80.0–100.0)
Platelets: 203 K/uL (ref 150–400)
RBC: 3.68 MIL/uL — ABNORMAL LOW (ref 4.22–5.81)
RDW: 15.6 % — ABNORMAL HIGH (ref 11.5–15.5)
WBC: 9.1 K/uL (ref 4.0–10.5)
nRBC: 0 % (ref 0.0–0.2)

## 2023-12-03 LAB — MAGNESIUM: Magnesium: 1.1 mg/dL — ABNORMAL LOW (ref 1.7–2.4)

## 2023-12-03 LAB — MRSA NEXT GEN BY PCR, NASAL: MRSA by PCR Next Gen: NOT DETECTED

## 2023-12-03 LAB — ETHANOL: Alcohol, Ethyl (B): <15 mg/dL (ref <15)

## 2023-12-03 LAB — LIPASE, BLOOD: Lipase: 33 U/L (ref 11–51)

## 2023-12-03 MED ORDER — FOLIC ACID 1 MG PO TABS: 1.0000 mg | ORAL_TABLET | Freq: Every day | ORAL | Status: DC

## 2023-12-03 MED ORDER — ENSURE PLUS HIGH PROTEIN PO LIQD
237.0000 mL | Freq: Two times a day (BID) | ORAL | Status: DC
  Administered 2023-12-04: 237 mL via ORAL

## 2023-12-03 MED ORDER — PANTOPRAZOLE SODIUM 40 MG PO TBEC
40.0000 mg | DELAYED_RELEASE_TABLET | Freq: Every day | ORAL | Status: DC
Start: 2023-12-04 — End: 2023-12-05
  Administered 2023-12-04 – 2023-12-05 (×2): 40 mg via ORAL
  Filled 2023-12-03 (×2): qty 1

## 2023-12-03 MED ORDER — ADULT MULTIVITAMIN W/MINERALS CH: 1.0000 | ORAL_TABLET | Freq: Every day | ORAL | Status: DC

## 2023-12-03 MED ORDER — ORAL CARE MOUTH RINSE
15.0000 mL | OROMUCOSAL | Status: DC | PRN
Start: 2023-12-03 — End: 2023-12-05

## 2023-12-03 MED ORDER — ACETAMINOPHEN 325 MG PO TABS
650.0000 mg | ORAL_TABLET | Freq: Four times a day (QID) | ORAL | Status: DC | PRN
  Administered 2023-12-03 – 2023-12-04 (×3): 650 mg via ORAL
  Filled 2023-12-03 (×3): qty 2

## 2023-12-03 MED ORDER — FENTANYL CITRATE PF 50 MCG/ML IJ SOSY
50.0000 ug | PREFILLED_SYRINGE | Freq: Once | INTRAMUSCULAR | Status: AC
  Administered 2023-12-03: 50 ug via INTRAVENOUS
  Filled 2023-12-03: qty 1

## 2023-12-03 MED ORDER — PANCRELIPASE (LIP-PROT-AMYL) 36000-114000 UNITS PO CPEP
36000.0000 [IU] | ORAL_CAPSULE | ORAL | Status: DC | PRN
  Administered 2023-12-03: 36000 [IU] via ORAL
  Filled 2023-12-03: qty 1

## 2023-12-03 MED ORDER — TAMSULOSIN HCL 0.4 MG PO CAPS
0.4000 mg | ORAL_CAPSULE | Freq: Every day | ORAL | Status: DC
  Administered 2023-12-03 – 2023-12-05 (×3): 0.4 mg via ORAL
  Filled 2023-12-03 (×3): qty 1

## 2023-12-03 MED ORDER — ADULT MULTIVITAMIN W/MINERALS CH
1.0000 | ORAL_TABLET | Freq: Every day | ORAL | Status: DC
  Administered 2023-12-04 – 2023-12-05 (×2): 1 via ORAL
  Filled 2023-12-03 (×2): qty 1

## 2023-12-03 MED ORDER — THIAMINE MONONITRATE 100 MG PO TABS
100.0000 mg | ORAL_TABLET | Freq: Every day | ORAL | Status: DC
  Administered 2023-12-03 – 2023-12-05 (×3): 100 mg via ORAL
  Filled 2023-12-03 (×3): qty 1

## 2023-12-03 MED ORDER — LORAZEPAM 1 MG PO TABS
1.0000 mg | ORAL_TABLET | ORAL | Status: DC | PRN
  Administered 2023-12-03 – 2023-12-04 (×3): 2 mg via ORAL
  Administered 2023-12-04: 1 mg via ORAL
  Administered 2023-12-04 (×3): 2 mg via ORAL
  Administered 2023-12-05: 1 mg via ORAL
  Administered 2023-12-05: 2 mg via ORAL
  Filled 2023-12-03 (×2): qty 2
  Filled 2023-12-03: qty 1
  Filled 2023-12-03 (×2): qty 2
  Filled 2023-12-03: qty 1
  Filled 2023-12-03 (×3): qty 2

## 2023-12-03 MED ORDER — MAGNESIUM SULFATE 2 GM/50ML IV SOLN
2.0000 g | Freq: Once | INTRAVENOUS | Status: AC
  Administered 2023-12-03: 2 g via INTRAVENOUS
  Filled 2023-12-03: qty 50

## 2023-12-03 MED ORDER — NICOTINE 21 MG/24HR TD PT24
21.0000 mg | MEDICATED_PATCH | Freq: Every day | TRANSDERMAL | Status: DC
  Administered 2023-12-03 – 2023-12-05 (×3): 21 mg via TRANSDERMAL
  Filled 2023-12-03 (×3): qty 1

## 2023-12-03 MED ORDER — ALUM & MAG HYDROXIDE-SIMETH 200-200-20 MG/5ML PO SUSP
30.0000 mL | Freq: Once | ORAL | Status: AC
  Administered 2023-12-03: 30 mL via ORAL
  Filled 2023-12-03: qty 30

## 2023-12-03 MED ORDER — MORPHINE SULFATE (PF) 2 MG/ML IV SOLN
2.0000 mg | INTRAVENOUS | Status: DC | PRN
  Administered 2023-12-03 – 2023-12-05 (×9): 2 mg via INTRAVENOUS
  Filled 2023-12-03 (×9): qty 1

## 2023-12-03 MED ORDER — MAGNESIUM OXIDE -MG SUPPLEMENT 400 (240 MG) MG PO TABS
400.0000 mg | ORAL_TABLET | Freq: Every day | ORAL | Status: DC
  Administered 2023-12-04: 400 mg via ORAL
  Filled 2023-12-03: qty 1

## 2023-12-03 MED ORDER — FOLIC ACID 1 MG PO TABS
1.0000 mg | ORAL_TABLET | Freq: Every day | ORAL | Status: DC
  Administered 2023-12-04 – 2023-12-05 (×2): 1 mg via ORAL
  Filled 2023-12-03 (×2): qty 1

## 2023-12-03 MED ORDER — LORAZEPAM 2 MG/ML IJ SOLN
1.0000 mg | INTRAMUSCULAR | Status: DC | PRN
  Administered 2023-12-03: 1 mg via INTRAVENOUS
  Administered 2023-12-03: 2 mg via INTRAVENOUS
  Administered 2023-12-04: 1 mg via INTRAVENOUS
  Administered 2023-12-04: 2 mg via INTRAVENOUS
  Filled 2023-12-03 (×5): qty 1

## 2023-12-03 MED ORDER — PNEUMOCOCCAL 20-VAL CONJ VACC 0.5 ML IM SUSY
0.5000 mL | PREFILLED_SYRINGE | INTRAMUSCULAR | Status: DC
  Filled 2023-12-03: qty 0.5

## 2023-12-03 MED ORDER — OXYCODONE-ACETAMINOPHEN 5-325 MG PO TABS
1.0000 | ORAL_TABLET | ORAL | Status: DC | PRN
  Administered 2023-12-03: 1 via ORAL
  Filled 2023-12-03: qty 1

## 2023-12-03 MED ORDER — CARVEDILOL 25 MG PO TABS
25.0000 mg | ORAL_TABLET | Freq: Two times a day (BID) | ORAL | Status: DC
Start: 2023-12-03 — End: 2023-12-05
  Administered 2023-12-03 – 2023-12-05 (×4): 25 mg via ORAL
  Filled 2023-12-03 (×4): qty 1

## 2023-12-03 MED ORDER — ONDANSETRON HCL 4 MG/2ML IJ SOLN
4.0000 mg | Freq: Once | INTRAMUSCULAR | Status: AC
  Administered 2023-12-03: 4 mg via INTRAVENOUS
  Filled 2023-12-03: qty 2

## 2023-12-03 MED ORDER — THIAMINE HCL 100 MG/ML IJ SOLN: 100.0000 mg | Freq: Every day | INTRAMUSCULAR | Status: DC

## 2023-12-03 MED ORDER — PANCRELIPASE (LIP-PROT-AMYL) 36000-114000 UNITS PO CPEP
72000.0000 [IU] | ORAL_CAPSULE | Freq: Three times a day (TID) | ORAL | Status: DC
  Administered 2023-12-03 – 2023-12-05 (×5): 72000 [IU] via ORAL
  Filled 2023-12-03 (×6): qty 2

## 2023-12-03 MED ORDER — POLYETHYLENE GLYCOL 3350 17 G PO PACK: 17.0000 g | PACK | Freq: Every day | ORAL | Status: DC | PRN

## 2023-12-03 MED ORDER — ONDANSETRON 4 MG PO TBDP
4.0000 mg | ORAL_TABLET | Freq: Three times a day (TID) | ORAL | Status: DC | PRN
  Administered 2023-12-03 – 2023-12-05 (×3): 4 mg via ORAL
  Filled 2023-12-03 (×3): qty 1

## 2023-12-03 MED ORDER — SUCRALFATE 1 G PO TABS
1.0000 g | ORAL_TABLET | Freq: Three times a day (TID) | ORAL | Status: DC
Start: 2023-12-03 — End: 2023-12-05
  Administered 2023-12-03 – 2023-12-05 (×5): 1 g via ORAL
  Filled 2023-12-03 (×6): qty 1

## 2023-12-03 MED ORDER — PROCAINAMIDE HCL 100 MG/ML IJ SOLN
1000.0000 mg | Freq: Once | INTRAVENOUS | Status: AC
  Administered 2023-12-03: 1000 mg via INTRAVENOUS
  Filled 2023-12-03 (×2): qty 10

## 2023-12-03 MED ORDER — LACTATED RINGERS IV BOLUS
1000.0000 mL | Freq: Once | INTRAVENOUS | Status: AC
  Administered 2023-12-03: 1000 mL via INTRAVENOUS

## 2023-12-03 NOTE — ED Notes (Signed)
 Heart rate noted to improve upon re-assessment. Pt reports chest tightness is better. Physician notified.

## 2023-12-03 NOTE — ED Notes (Signed)
CCMD notified

## 2023-12-03 NOTE — ED Notes (Signed)
 Moore MD made aware of lab warning (trops 27 from 6) via secure chat.

## 2023-12-03 NOTE — ED Notes (Addendum)
 After multiple staff members noticed the smell of smoke RN called to look inside pts room. Pt found to have pack of green and white cigarettes out. Pt told politely that he can not smoke on premises . Pt followed RN out to argue he was just using his chewing tobacco which he showed RN. Pt instructed not to use chewing tobacco on premise and that multiple staff members can smell the smoke.

## 2023-12-03 NOTE — H&P (Addendum)
 History and Physical    Patient: [name] FMW:[id number] DOB: Feb 27, 1971 DOA: 12/03/2023 DOS: the patient was seen and examined on 12/03/2023 PCP: Celestia Rosaline SQUIBB, NP  Patient coming from: Home  Chief Complaint:  Chief Complaint  Patient presents with   Tachycardia   HPI: [name] is a 53 y.o. male with medical history significant of WPW, alcohol  abuse, chronic pancreatitis, polysubstance use (cocaine), and BPD p/w AFRVR.  Pt was in his USOH until this morning at 0600 when he woke up and felt uneasy. Pt reports that he woke up and had chest tightness, so he ate apple sauce and toast before he threw everything back up and activated EMS.  In the ED, pt tachycardic. Labs notable for K 3.4, Mg 1.1, troponin 6-->27, and positive THC. CXR w/o active disease. Previous CTA with pancreatic calcification c/w chronic pancreatitis (lipase wnl, 31 and 33 on 7/26 and 7/27 respectively). EDP administered IV procainamide , IV Mg 2g, consulted cards, and requested medicine admission.  Review of Systems: As mentioned in the history of present illness. All other systems reviewed and are negative. Past Medical History:  Diagnosis Date   Alcoholism /alcohol  abuse    Anemia    Anxiety    Arthritis    knees; arms; elbows (03/26/2015)   Asthma    Bipolar disorder (HCC)    Chronic bronchitis (HCC)    Chronic lower back pain    Chronic pancreatitis (HCC)    Cocaine abuse (HCC)    Depression    Family history of adverse reaction to anesthesia    Femoral condyle fracture (HCC) 03/08/2014   left medial/notes 03/09/2014   GERD (gastroesophageal reflux disease)    H/O hiatal hernia    H/O suicide attempt 10/2012   High cholesterol    History of blood transfusion 10/2012   when I tried to commit suicide   History of stomach ulcers    Hypertension    Marijuana abuse, continuous    Migraine    a few times/year (03/26/2015)   Pancreatitis    Pneumonia 1990's X 3   PTSD  (post-traumatic stress disorder)    Seizures (HCC)    Sickle cell trait (HCC)    WPW (Wolff-Parkinson-White syndrome)    thelbert 03/06/2013   Past Surgical History:  Procedure Laterality Date   BIOPSY  11/25/2017   Procedure: BIOPSY;  Surgeon: Burnette Fallow, MD;  Location: [facility] ENDOSCOPY;  Service: Endoscopy;;   BIOPSY  10/14/2018   Procedure: BIOPSY;  Surgeon: Burnette Fallow, MD;  Location: [facility] ENDOSCOPY;  Service: Endoscopy;;   BIOPSY  03/06/2023   Procedure: BIOPSY;  Surgeon: Wilhelmenia Aloha Raddle., MD;  Location: [facility];  Service: Gastroenterology;;   CARDIAC CATHETERIZATION     CYST ENTEROSTOMY  01/02/2020   Procedure: CYST ASPIRATION;  Surgeon: Teressa Toribio SQUIBB, MD;  Location: [facility] ENDOSCOPY;  Service: Endoscopy;;   ESOPHAGOGASTRODUODENOSCOPY N/A 03/06/2023   Procedure: ESOPHAGOGASTRODUODENOSCOPY (EGD);  Surgeon: Wilhelmenia Aloha Raddle., MD;  Location: [facility];  Service: Gastroenterology;  Laterality: N/A;   ESOPHAGOGASTRODUODENOSCOPY (EGD) WITH PROPOFOL  N/A 11/25/2017   Procedure: ESOPHAGOGASTRODUODENOSCOPY (EGD) WITH PROPOFOL ;  Surgeon: Burnette Fallow, MD;  Location: [facility] ENDOSCOPY;  Service: Endoscopy;  Laterality: N/A;   ESOPHAGOGASTRODUODENOSCOPY (EGD) WITH PROPOFOL  Left 10/14/2018   Procedure: ESOPHAGOGASTRODUODENOSCOPY (EGD) WITH PROPOFOL ;  Surgeon: Burnette Fallow, MD;  Location: [facility] ENDOSCOPY;  Service: Endoscopy;  Laterality: Left;   ESOPHAGOGASTRODUODENOSCOPY (EGD) WITH PROPOFOL  N/A 11/14/2018   Procedure: ESOPHAGOGASTRODUODENOSCOPY (EGD) WITH PROPOFOL ;  Surgeon: Celestia Agent, MD;  Location: [facility]  ENDOSCOPY;  Service: Gastroenterology;  Laterality: N/A;   ESOPHAGOGASTRODUODENOSCOPY (EGD) WITH PROPOFOL  N/A 01/02/2020   Procedure: ESOPHAGOGASTRODUODENOSCOPY (EGD) WITH PROPOFOL ;  Surgeon: Teressa Toribio SQUIBB, MD;  Location: [facility] ENDOSCOPY;  Service: Endoscopy;  Laterality: N/A;   ESOPHAGOGASTRODUODENOSCOPY (EGD) WITH PROPOFOL  N/A 10/25/2020   Procedure: ESOPHAGOGASTRODUODENOSCOPY  (EGD) WITH PROPOFOL ;  Surgeon: Wilhelmenia Aloha Raddle., MD;  Location: [facility] ENDOSCOPY;  Service: Gastroenterology;  Laterality: N/A;   EUS N/A 01/02/2020   Procedure: UPPER ENDOSCOPIC ULTRASOUND (EUS) RADIAL;  Surgeon: Teressa Toribio SQUIBB, MD;  Location: [facility] ENDOSCOPY;  Service: Endoscopy;  Laterality: N/A;   EUS N/A 03/06/2023   Procedure: UPPER ENDOSCOPIC ULTRASOUND (EUS) RADIAL;  Surgeon: Wilhelmenia Aloha Raddle., MD;  Location: [facility] ENDOSCOPY;  Service: Gastroenterology;  Laterality: N/A;   EYE SURGERY Left 1990's   result of trauma    FACIAL FRACTURE SURGERY Left 1990's   result of trauma    FINE NEEDLE ASPIRATION N/A 03/06/2023   Procedure: FINE NEEDLE ASPIRATION (FNA) LINEAR;  Surgeon: Wilhelmenia Aloha Raddle., MD;  Location: [facility] ENDOSCOPY;  Service: Gastroenterology;  Laterality: N/A;   FLEXIBLE SIGMOIDOSCOPY N/A 10/25/2020   Procedure: FLEXIBLE SIGMOIDOSCOPY;  Surgeon: Wilhelmenia Aloha Raddle., MD;  Location: [facility] ENDOSCOPY;  Service: Gastroenterology;  Laterality: N/A;   FRACTURE SURGERY     HEMOSTASIS CLIP PLACEMENT  10/25/2020   Procedure: HEMOSTASIS CLIP PLACEMENT;  Surgeon: Wilhelmenia Aloha Raddle., MD;  Location: [facility] ENDOSCOPY;  Service: Gastroenterology;;   HERNIA REPAIR     HOT HEMOSTASIS N/A 03/06/2023   Procedure: HOT HEMOSTASIS (ARGON PLASMA COAGULATION/BICAP);  Surgeon: Wilhelmenia Aloha Raddle., MD;  Location: [facility];  Service: Gastroenterology;  Laterality: N/A;   LEFT HEART CATHETERIZATION WITH CORONARY ANGIOGRAM Right 03/07/2013   Procedure: LEFT HEART CATHETERIZATION WITH CORONARY ANGIOGRAM;  Surgeon: Salena GORMAN Negri, MD;  Location: [facility] CATH LAB;  Service: Cardiovascular;  Laterality: Right;   UMBILICAL HERNIA REPAIR     UPPER GASTROINTESTINAL ENDOSCOPY     Social History:  reports that he has been smoking cigarettes. He has a 36 pack-year smoking history. He has never used smokeless tobacco. He reports that he does not currently use alcohol . He reports current drug use. Drugs:  Marijuana and Cocaine.  Allergies  Allergen Reactions   Robaxin  [Methocarbamol ] Other (See Comments)    Myoclonus    Bayer Aspirin  [Aspirin ] Other (See Comments)    Unknown reaction   Trazodone  And Nefazodone Other (See Comments)    Muscle spasms   Adhesive [Tape] Itching   Latex Itching   Toradol  [Ketorolac  Tromethamine ] Other (See Comments)    Hx ulcers   Contrast Media [Iodinated Contrast Media] Hives   Fish Allergy Nausea And Vomiting and Rash   Reglan  [Metoclopramide ] Other (See Comments)    Muscle spasms   Salmon [Fish Oil] Nausea And Vomiting and Rash   Shellfish Allergy Nausea And Vomiting and Rash    Family History  Problem Relation Age of Onset   Hypertension Mother    Cirrhosis Father    Alcoholism Father    Hypertension Father    Melanoma Father    Hypertension Other    Coronary artery disease Other     Prior to Admission medications   Medication Sig Start Date End Date Taking? Authorizing Provider  acetaminophen  (TYLENOL ) 500 MG tablet Take 1,000 mg by mouth daily as needed for mild pain (pain score 1-3) or moderate pain (pain score 4-6).   Yes [provider]  carvedilol  (COREG ) 25 MG tablet Take 1 tablet (25 mg total) by mouth 2 (two)  times daily with a meal. 11/13/23  Yes Amin, Ankit C, MD  famotidine  (PEPCID ) 40 MG tablet Take 1 tablet (40 mg total) by mouth daily. Patient taking differently: Take 40 mg by mouth 2 (two) times daily. 10/17/23  Yes Henderly, Britni A, PA-C  folic acid  (FOLVITE ) 1 MG tablet Take 1 tablet (1 mg total) by mouth daily. Patient taking differently: Take 1 mg by mouth 2 (two) times daily. 11/23/23 12/23/23 Yes Pahwani, Fredia, MD  lidocaine  (XYLOCAINE ) 2 % solution Use as directed 15 mLs in the mouth or throat every 6 (six) hours as needed for mouth pain. 12/02/23  Yes Charlyn Sora, MD  lipase/protease/amylase (CREON ) 36000 UNITS CPEP capsule Take 2 capsules (72,000 Units total) by mouth 3 (three) times daily with meals. May also  take 1 capsule (36,000 Units total) as needed (with snacks). 08/21/23  Yes Ghimire, Donalda HERO, MD  loperamide  (IMODIUM  A-D) 2 MG tablet Take 2 mg by mouth 4 (four) times daily as needed for diarrhea or loose stools.   Yes [provider]  magnesium  oxide (MAG-OX) 400 (240 Mg) MG tablet Take 1 tablet (400 mg total) by mouth daily. 11/07/23  Yes Hoy Fraction F, PA-C  Multiple Vitamin (MULTIVITAMIN WITH MINERALS) TABS tablet Take 1 tablet by mouth daily. Patient taking differently: Take 2 tablets by mouth daily. 08/21/23  Yes Ghimire, Donalda HERO, MD  nicotine  (NICODERM CQ  - DOSED IN MG/24 HOURS) 21 mg/24hr patch Place 1 patch (21 mg total) onto the skin daily. 11/14/23  Yes Amin, Ankit C, MD  ondansetron  (ZOFRAN -ODT) 4 MG disintegrating tablet Take 1 tablet (4 mg total) by mouth every 8 (eight) hours as needed. 11/23/23  Yes Pahwani, Fredia, MD  oxyCODONE -acetaminophen  (PERCOCET) 5-325 MG tablet Take 1 tablet by mouth every 4 (four) hours as needed. 11/28/23  Yes Jarold Olam HERO, PA-C  pantoprazole  (PROTONIX ) 40 MG tablet Take 1 tablet (40 mg total) by mouth daily. 12/02/23  Yes Nanavati, Ankit, MD  polyethylene glycol powder (GLYCOLAX /MIRALAX ) 17 GM/SCOOP powder Take 17 g by mouth daily as needed for mild constipation. 11/13/23  Yes Amin, Ankit C, MD  sucralfate  (CARAFATE ) 1 g tablet Take 1 g by mouth 3 (three) times daily with meals.   Yes [provider]  tamsulosin  (FLOMAX ) 0.4 MG CAPS capsule Take 1 capsule (0.4 mg total) by mouth daily. Patient not taking: Reported on 12/03/2023 08/21/23   Raenelle Donalda HERO, MD  amitriptyline  (ELAVIL ) 25 MG tablet Take 1 tablet (25 mg total) by mouth at bedtime. Patient not taking: Reported on 08/08/2019 10/15/18 08/08/19  Tobie Yetta HERO, MD    Physical Exam: Vitals:   12/03/23 9078 12/03/23 0944 12/03/23 1147 12/03/23 1208  BP: 113/84  115/88   Pulse:  87 74   Resp:   13   Temp:   97.8 F (36.6 C)   TempSrc:   Oral   SpO2:   100% 100%  Weight:       Height:       General: Alert, oriented x3, resting comfortably in no acute distress Respiratory: Lungs clear to auscultation bilaterally with normal respiratory effort; no w/r/r Cardiovascular: Regular rate and rhythm w/o m/r/g   Data Reviewed:  Lab Results  Component Value Date   WBC 9.1 12/03/2023   HGB 10.9 (L) 12/03/2023   HCT 33.7 (L) 12/03/2023   MCV 91.6 12/03/2023   PLT 203 12/03/2023   Lab Results  Component Value Date   GLUCOSE 173 (H) 12/03/2023   CALCIUM  8.4 (L)  12/03/2023   NA 139 12/03/2023   K 3.4 (L) 12/03/2023   CO2 19 (L) 12/03/2023   CL 105 12/03/2023   BUN 10 12/03/2023   CREATININE 1.04 12/03/2023   Lab Results  Component Value Date   ALT 26 12/03/2023   AST 47 (H) 12/03/2023   ALKPHOS 62 12/03/2023   BILITOT 0.6 12/03/2023   Lab Results  Component Value Date   INR 0.9 10/13/2023   INR 1.2 08/11/2023   INR 1.0 06/19/2023    Radiology: DG Chest Portable 1 View Result Date: 12/03/2023 CLINICAL DATA:  Chest pain EXAM: PORTABLE CHEST 1 VIEW COMPARISON:  12/02/2023 FINDINGS: The lungs are clear without focal pneumonia, edema, pneumothorax or pleural effusion. The cardiopericardial silhouette is within normal limits for size. No acute bony abnormality. Telemetry leads overlie the chest. IMPRESSION: No active disease. Electronically Signed   By: Camellia Candle M.D.   On: 12/03/2023 09:22    Assessment and Plan: 60M h/o WPW, alcohol  abuse, chronic pancreatitis, polysubstance use (cocaine), and BPD p/w AFRVR.  AFRVR H/o WPW Pt presented with tachycardia (HR 140s) that converted s/p IV procainamide  (per Cards curbside via EDP) -Tele; consider IP cards eval if recurrent AFRVR -PTA coreg  25mg  BID -OP Cards eval at d/c  NSTEMI Presumed demand ischemia; troponin 6-->27 -F/u troponins until peak per protocol  H/o alcohol  abuse H/o chronic pancreatitis -IV/PO ativan  per [facility]WA -IV morphine  2mg  q4h prn for now (transition to PO meds in 24-48h) -PTA  Creon  and Carafate  -PTA PPI  H/o BPH -PTA Flomax    Advance Care Planning:   Code Status: Prior   Consults: Cards  Family Communication: N/A  Severity of Illness: The appropriate patient status for this patient is INPATIENT. Inpatient status is judged to be reasonable and necessary in order to provide the required intensity of service to ensure the patient's safety. The patient's presenting symptoms, physical exam findings, and initial radiographic and laboratory data in the context of their chronic comorbidities is felt to place them at high risk for further clinical deterioration. Furthermore, it is not anticipated that the patient will be medically stable for discharge from the hospital within 2 midnights of admission.   * I certify that at the point of admission it is my clinical judgment that the patient will require inpatient hospital care spanning beyond 2 midnights from the point of admission due to high intensity of service, high risk for further deterioration and high frequency of surveillance required.*   There are other unrelated non-urgent complaints, but due to the busy schedule and the amount of time I've already spent with him, time does not permit me to address these routine issues at today's visit. I've requested another appointment to review these additional issues.   ------- I spent 60 minutes reviewing previous labs/notes, obtaining separate history at the bedside, counseling/discussing the treatment plan outlined above, ordering medications/tests, and performing clinical documentation.  Author: Marsha Ada, MD 12/03/2023 12:37 PM  For on call review [email or web address].

## 2023-12-03 NOTE — ED Notes (Signed)
 CCMD called and notified

## 2023-12-03 NOTE — ED Provider Notes (Signed)
 [place] EMERGENCY DEPARTMENT AT [facility] Provider Note   CSN: [id number] Arrival date & time: 12/03/23  9197     Patient presents with: Tachycardia   [name] is a 53 y.o. male.   53 year old male presenting to the emergency department for chest pain as well as abdominal pain and vomiting.  Awoke this morning.  Tried to eat some food, then vomited and had central chest pain thereafter.  Noted his heart was racing, feeling somewhat lightheaded.  Notes history of WPW as well as paroxysmal atrial fibrillation.  Not on anticoagulation.  He notes he is scheduled to see cardiology for another ablation tomorrow.         Prior to Admission medications   Medication Sig Start Date End Date Taking? Authorizing Provider  acetaminophen  (TYLENOL ) 500 MG tablet Take 1,000 mg by mouth daily as needed for mild pain (pain score 1-3) or moderate pain (pain score 4-6).   Yes [provider]  carvedilol  (COREG ) 25 MG tablet Take 1 tablet (25 mg total) by mouth 2 (two) times daily with a meal. 11/13/23  Yes Amin, Ankit C, MD  famotidine  (PEPCID ) 40 MG tablet Take 1 tablet (40 mg total) by mouth daily. Patient taking differently: Take 40 mg by mouth 2 (two) times daily. 10/17/23  Yes Henderly, Britni A, PA-C  folic acid  (FOLVITE ) 1 MG tablet Take 1 tablet (1 mg total) by mouth daily. Patient taking differently: Take 1 mg by mouth 2 (two) times daily. 11/23/23 12/23/23 Yes Pahwani, Fredia, MD  lidocaine  (XYLOCAINE ) 2 % solution Use as directed 15 mLs in the mouth or throat every 6 (six) hours as needed for mouth pain. 12/02/23  Yes Charlyn Sora, MD  lipase/protease/amylase (CREON ) 36000 UNITS CPEP capsule Take 2 capsules (72,000 Units total) by mouth 3 (three) times daily with meals. May also take 1 capsule (36,000 Units total) as needed (with snacks). 08/21/23  Yes Ghimire, Donalda HERO, MD  loperamide  (IMODIUM  A-D) 2 MG tablet Take 2 mg by mouth 4 (four) times daily as needed  for diarrhea or loose stools.   Yes [provider]  magnesium  oxide (MAG-OX) 400 (240 Mg) MG tablet Take 1 tablet (400 mg total) by mouth daily. 11/07/23  Yes Hoy Fraction F, PA-C  Multiple Vitamin (MULTIVITAMIN WITH MINERALS) TABS tablet Take 1 tablet by mouth daily. Patient taking differently: Take 2 tablets by mouth daily. 08/21/23  Yes Ghimire, Donalda HERO, MD  nicotine  (NICODERM CQ  - DOSED IN MG/24 HOURS) 21 mg/24hr patch Place 1 patch (21 mg total) onto the skin daily. 11/14/23  Yes Amin, Ankit C, MD  ondansetron  (ZOFRAN -ODT) 4 MG disintegrating tablet Take 1 tablet (4 mg total) by mouth every 8 (eight) hours as needed. 11/23/23  Yes Pahwani, Fredia, MD  oxyCODONE -acetaminophen  (PERCOCET) 5-325 MG tablet Take 1 tablet by mouth every 4 (four) hours as needed. 11/28/23  Yes Jarold Olam HERO, PA-C  pantoprazole  (PROTONIX ) 40 MG tablet Take 1 tablet (40 mg total) by mouth daily. 12/02/23  Yes Nanavati, Ankit, MD  polyethylene glycol powder (GLYCOLAX /MIRALAX ) 17 GM/SCOOP powder Take 17 g by mouth daily as needed for mild constipation. 11/13/23  Yes Amin, Ankit C, MD  sucralfate  (CARAFATE ) 1 g tablet Take 1 g by mouth 3 (three) times daily with meals.   Yes [provider]  tamsulosin  (FLOMAX ) 0.4 MG CAPS capsule Take 1 capsule (0.4 mg total) by mouth daily. Patient not taking: Reported on 12/03/2023 08/21/23   Raenelle Donalda HERO, MD  amitriptyline  (ELAVIL ) 25 MG tablet Take 1 tablet (25 mg total) by mouth at bedtime. Patient not taking: Reported on 08/08/2019 10/15/18 08/08/19  Tobie Yetta HERO, MD    Allergies: Robaxin  [methocarbamol ], Bayer aspirin  [aspirin ], Trazodone  and nefazodone, Adhesive [tape], Latex, Toradol  [ketorolac  tromethamine ], Contrast media [iodinated contrast media], Fish allergy, Reglan  [metoclopramide ], Salmon [fish oil], and Shellfish allergy    Review of Systems  Updated Vital Signs BP 128/85   Pulse 90   Temp 97.8 F (36.6 C) (Oral)   Resp 12   Ht 5' 9 (1.753 m)    Wt 55.8 kg   SpO2 100%   BMI 18.16 kg/m   Physical Exam Vitals and nursing note reviewed.  Constitutional:      General: He is not in acute distress.    Appearance: He is not toxic-appearing.  HENT:     Head: Normocephalic.     Nose: Nose normal.     Mouth/Throat:     Mouth: Mucous membranes are moist.  Eyes:     Conjunctiva/sclera: Conjunctivae normal.  Cardiovascular:     Rate and Rhythm: Tachycardia present. Rhythm irregular.     Pulses: Normal pulses.  Pulmonary:     Effort: Pulmonary effort is normal.     Breath sounds: Normal breath sounds.  Abdominal:     General: Abdomen is flat. There is no distension.     Tenderness: There is no abdominal tenderness. There is no guarding or rebound.  Musculoskeletal:        General: Normal range of motion.  Skin:    General: Skin is warm and dry.     Capillary Refill: Capillary refill takes less than 2 seconds.  Neurological:     Mental Status: He is alert and oriented to person, place, and time.  Psychiatric:        Mood and Affect: Mood normal.        Behavior: Behavior normal.     (all labs ordered are listed, but only abnormal results are displayed) Labs Reviewed  CBC - Abnormal; Notable for the following components:      Result Value   RBC 3.68 (*)    Hemoglobin 10.9 (*)    HCT 33.7 (*)    RDW 15.6 (*)    All other components within normal limits  COMPREHENSIVE METABOLIC PANEL WITH GFR - Abnormal; Notable for the following components:   Potassium 3.4 (*)    CO2 19 (*)    Glucose, Bld 173 (*)    Calcium  8.4 (*)    Albumin  3.4 (*)    AST 47 (*)    All other components within normal limits  MAGNESIUM  - Abnormal; Notable for the following components:   Magnesium  1.1 (*)    All other components within normal limits  RAPID URINE DRUG SCREEN, HOSP PERFORMED - Abnormal; Notable for the following components:   Tetrahydrocannabinol POSITIVE (*)    All other components within normal limits  TROPONIN I (HIGH  SENSITIVITY) - Abnormal; Notable for the following components:   Troponin I (High Sensitivity) 27 (*)    All other components within normal limits  LIPASE, BLOOD  ETHANOL  TROPONIN I (HIGH SENSITIVITY)    EKG: EKG Interpretation Date/Time:  Sunday December 03 2023 09:33:55 EDT Ventricular Rate:  91 PR Interval:  112 QRS Duration:  78 QT Interval:  332 QTC Calculation: 409 R Axis:   65  Text Interpretation: Sinus rhythm Multiple premature complexes, vent & supraven Borderline short PR interval Biatrial enlargement Abnormal  T, consider ischemia, diffuse leads Confirmed by Neysa Clap [phone]) on 12/03/2023 10:20:24 AM  Radiology: DG Chest Portable 1 View Result Date: 12/03/2023 CLINICAL DATA:  Chest pain EXAM: PORTABLE CHEST 1 VIEW COMPARISON:  12/02/2023 FINDINGS: The lungs are clear without focal pneumonia, edema, pneumothorax or pleural effusion. The cardiopericardial silhouette is within normal limits for size. No acute bony abnormality. Telemetry leads overlie the chest. IMPRESSION: No active disease. Electronically Signed   By: Camellia Candle M.D.   On: 12/03/2023 09:22   CT Angio Chest/Abd/Pel for Dissection W and/or Wo Contrast Result Date: 12/02/2023 CLINICAL DATA:  Chest pain radiating to the back. Acute aortic syndrome suspected. EXAM: CT ANGIOGRAPHY CHEST, ABDOMEN AND PELVIS TECHNIQUE: Non-contrast CT of the chest was initially obtained. Multidetector CT imaging through the chest, abdomen and pelvis was performed using the standard protocol during bolus administration of intravenous contrast. Multiplanar reconstructed images and MIPs were obtained and reviewed to evaluate the vascular anatomy. RADIATION DOSE REDUCTION: This exam was performed according to the departmental dose-optimization program which includes automated exposure control, adjustment of the mA and/or kV according to patient size and/or use of iterative reconstruction technique. CONTRAST:  OMNIPAQUE  IOHEXOL  350  MG/ML SOLN COMPARISON:  CT dated 11/21/2023. FINDINGS: CTA CHEST FINDINGS Cardiovascular: There is no cardiomegaly. Trace pericardial effusion. The thoracic aorta is unremarkable. The origins of the great vessels of the aortic arch appear patent. No pulmonary artery embolus identified. Mediastinum/Nodes: No hilar or mediastinal adenopathy. The esophagus is grossly unremarkable no mediastinal fluid collection. Lungs/Pleura: Background of emphysema. No focal consolidation, pleural effusion or pneumothorax. The central airways are patent. Musculoskeletal: No acute osseous pathology. Review of the MIP images confirms the above findings. CTA ABDOMEN AND PELVIS FINDINGS VASCULAR Aorta: Mild atherosclerotic calcification. No aneurysmal dilatation or dissection. No periaortic fluid collection. Celiac: Patent without evidence of aneurysm, dissection, vasculitis or significant stenosis. SMA: Patent without evidence of aneurysm, dissection, vasculitis or significant stenosis. Renals: Both renal arteries are patent without evidence of aneurysm, dissection, vasculitis, fibromuscular dysplasia or significant stenosis. IMA: Patent without evidence of aneurysm, dissection, vasculitis or significant stenosis. Inflow: Mild atherosclerotic calcification. No aneurysmal dilatation or dissection. The iliac arteries are patent. Veins: No obvious venous abnormality within the limitations of this arterial phase study. Review of the MIP images confirms the above findings. NON-VASCULAR No intra-abdominal free air or free fluid. Hepatobiliary: The liver is unremarkable. No biliary ductal dilatation. The gallbladder is unremarkable. Pancreas: Coarse calcifications of the pancreas sequela of chronic pancreatitis. Similar appearance of a 4 cm distal pancreatic pseudocysts. Correlation with pancreatic enzymes recommended to exclude an acute on chronic pancreatitis. 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 This was present on the prior CT. Spleen: Normal in size without focal abnormality. Adrenals/Urinary Tract: The adrenal glands unremarkable. There is no hydronephrosis on either side. The visualized ureters and urinary bladder appear unremarkable. Stomach/Bowel: There is no bowel obstruction or active inflammation. The appendix is normal. Lymphatic: No adenopathy. Reproductive: The prostate and seminal vesicles are grossly unremarkable. Other: None Musculoskeletal: No acute osseous pathology. Review of the MIP images confirms the above findings. IMPRESSION: 1. No acute intrathoracic, abdominal, or pelvic pathology. No aortic aneurysm or dissection. 2. Sequela of chronic pancreatitis. Correlation with pancreatic enzymes recommended to exclude an acute on chronic pancreatitis. 3. No bowel obstruction. Normal appendix. 4.  Emphysema (ICD10-J43.9). Electronically Signed   By: Vanetta Chou M.D.   On: 12/02/2023 11:35  DG Chest 2 View Result Date: 12/02/2023 CLINICAL DATA:  Chest pain EXAM: CHEST - 2 VIEW COMPARISON:  11/28/2023 FINDINGS: The lungs are clear without focal pneumonia, edema, pneumothorax or pleural effusion. The cardiopericardial silhouette is within normal limits for size. No acute bony abnormality. Telemetry leads overlie the chest. IMPRESSION: No active cardiopulmonary disease. Electronically Signed   By: Camellia Candle M.D.   On: 12/02/2023 06:20     .Critical Care  Performed by: Neysa Caron PARAS, DO Authorized by: Neysa Caron PARAS, DO   Critical care provider statement:    Critical care time (minutes):  30   Critical care was necessary to treat or prevent imminent or life-threatening deterioration of the following conditions:  Circulatory failure   Critical care was time spent personally by me on the following activities:  Development of treatment plan with patient or surrogate, discussions with consultants, evaluation of patient's response to  treatment, examination of patient, ordering and review of laboratory studies, ordering and review of radiographic studies, ordering and performing treatments and interventions, pulse oximetry, re-evaluation of patient's condition and review of old charts    Medications Ordered in the ED  carvedilol  (COREG ) tablet 25 mg (has no administration in time range)  folic acid  (FOLVITE ) tablet 1 mg (has no administration in time range)  lipase/protease/amylase (CREON ) capsule 72,000 Units (has no administration in time range)    And  lipase/protease/amylase (CREON ) capsule 36,000 Units (has no administration in time range)  magnesium  oxide (MAG-OX) tablet 400 mg (has no administration in time range)  multivitamin with minerals tablet 1 tablet (has no administration in time range)  nicotine  (NICODERM CQ  - dosed in mg/24 hours) patch 21 mg (21 mg Transdermal Patch Applied 12/03/23 1244)  ondansetron  (ZOFRAN -ODT) disintegrating tablet 4 mg (4 mg Oral Given 12/03/23 1244)  pantoprazole  (PROTONIX ) EC tablet 40 mg (has no administration in time range)  polyethylene glycol (MIRALAX  / GLYCOLAX ) packet 17 g (has no administration in time range)  sucralfate  (CARAFATE ) tablet 1 g (has no administration in time range)  tamsulosin  (FLOMAX ) capsule 0.4 mg (0.4 mg Oral Given 12/03/23 1244)  LORazepam  (ATIVAN ) tablet 1-4 mg ( Oral See Alternative 12/03/23 1454)    Or  LORazepam  (ATIVAN ) injection 1-4 mg (2 mg Intravenous Given 12/03/23 1454)  thiamine  (VITAMIN B1) tablet 100 mg (100 mg Oral Given 12/03/23 1254)    Or  thiamine  (VITAMIN B1) injection 100 mg ( Intravenous See Alternative 12/03/23 1254)  morphine  (PF) 2 MG/ML injection 2 mg (2 mg Intravenous Given 12/03/23 1526)  lactated ringers  bolus 1,000 mL (0 mLs Intravenous Stopped 12/03/23 1008)  procainamide  (PRONESTYL ) 1,000 mg in dextrose  5 % 250 mL IVPB (0 mg Intravenous Stopped 12/03/23 1008)  fentaNYL  (SUBLIMAZE ) injection 50 mcg (50 mcg Intravenous Given 12/03/23  0833)  magnesium  sulfate IVPB 2 g 50 mL (0 g Intravenous Stopped 12/03/23 1124)  alum & mag hydroxide-simeth (MAALOX/MYLANTA) 200-200-20 MG/5ML suspension 30 mL (30 mLs Oral Given 12/03/23 1005)  ondansetron  (ZOFRAN ) injection 4 mg (4 mg Intravenous Given 12/03/23 1006)    Clinical Course as of 12/03/23 1606  Sun Dec 03, 2023  0811 CTA yesterday: IMPRESSION: 1. No acute intrathoracic, abdominal, or pelvic pathology. No aortic aneurysm or dissection. 2. Sequela of chronic pancreatitis. Correlation with pancreatic enzymes recommended to exclude an acute on chronic pancreatitis. 3. No bowel obstruction. Normal appendix. 4.  Emphysema (ICD10-J43.9).  [TY]  [id number] ECG yesterday appeared sinus rythm [TY]  0828 Discussed case with cardiology, notes that this patient is well-known  to them.  Would try procainamide  before cardioverting, but if becomes unstable cardiovert. [TY]  0901 Procainamide  coming from Main pharmacy.  Did discuss with ED pharmacist to try to expedite medication. [TY]  1002 Appears patient is converted to normal sinus rhythm heart rate in the 80s. [TY]  1052 Spoke to hospitalist who agrees to see and admit patient. [TY]    Clinical Course User Index [TY] Neysa Caron PARAS, DO                                 Medical Decision Making This is a 53 year old male complex past medical history to include polysubstance abuse, WPW, paroxysmal A-fib, alcohol  abuse, chronic pancreatitis and seizures presenting emergency department with chest pain.  Patient A-fib RVR on his EKG is independently interpreted by me as well as on the monitor with a heart rate in the 160s on arrival.  Did receive a loading dose of amiodarone  by EMS.  Case discussed with cardiology; see ED course.  Started on procainamide  with conversion of his A-fib continue to have abdominal pain, was seen yesterday with CT findings concerning for pancreatitis however lipase is normal.  No transaminitis to suggest hepatobiliary  disease.  Minor low potassium repleted.  Magnesium  low repleted his initial troponin not consistent with ACS.  Stable anemia compared to prior labs.  Did have continuing abdominal pain, received fentanyl  with some improvement.  Given patient's numerous comorbidities, CT scan finding yesterday for pancreatitis with continued epigastric pain and nausea vomiting will admit for further pain control as well as electrolyte repletion.  Case discussed with hospitalist who agrees to admit patient.  Amount and/or Complexity of Data Reviewed Independent Historian: EMS    Details: See above External Data Reviewed:     Details: See ED course Labs: ordered. Radiology: ordered and independent interpretation performed.    Details: Chest x-ray without pneumonia or pneumothorax ECG/medicine tests: independent interpretation performed.    Details: A-fib RVR  Risk OTC drugs. Prescription drug management. Parenteral controlled substances. Decision regarding hospitalization. Diagnosis or treatment significantly limited by social determinants of health. Risk Details: Polysubstance abuse.      Final diagnoses:  Atrial fibrillation with RVR (HCC)  Hypomagnesemia  Chronic pancreatitis, unspecified pancreatitis type [facility])    ED Discharge Orders          Ordered    Amb referral to AFIB Clinic        12/03/23 0827               Neysa Caron PARAS, DO 12/03/23 1606

## 2023-12-03 NOTE — Progress Notes (Signed)
 Cigarettes placed in chart at desk. Empty chewing tobacco can thrown away at patient request.

## 2023-12-03 NOTE — ED Notes (Signed)
 After pt went to the restroom Rn called to investigate after multiple staff members mentioned the smell of smoke.

## 2023-12-03 NOTE — ED Triage Notes (Signed)
 Pt to er via ems, per ems pt had an episode of vomiting and after vomiting developed some chest pain.  States that pt has a hx of WPW, states that they found him to be in afib with rvr at a rate of about 190-220, states that they gave 150 of amiodarone  and pt's rate is now in the 140s.  Pt c/o chest pain, states that they also gave 324mg  of aspirin .

## 2023-12-04 ENCOUNTER — Telehealth (INDEPENDENT_AMBULATORY_CARE_PROVIDER_SITE_OTHER): Payer: Self-pay | Admitting: Primary Care

## 2023-12-04 ENCOUNTER — Other Ambulatory Visit (HOSPITAL_COMMUNITY): Payer: Self-pay

## 2023-12-04 ENCOUNTER — Telehealth: Payer: Self-pay

## 2023-12-04 DIAGNOSIS — F10920 Alcohol use, unspecified with intoxication, uncomplicated: Secondary | ICD-10-CM

## 2023-12-04 DIAGNOSIS — R7989 Other specified abnormal findings of blood chemistry: Secondary | ICD-10-CM

## 2023-12-04 DIAGNOSIS — I480 Paroxysmal atrial fibrillation: Principal | ICD-10-CM

## 2023-12-04 DIAGNOSIS — F14929 Cocaine use, unspecified with intoxication, unspecified: Secondary | ICD-10-CM

## 2023-12-04 LAB — COMPREHENSIVE METABOLIC PANEL WITH GFR
ALT: 28 U/L (ref 0–44)
AST: 45 U/L — ABNORMAL HIGH (ref 15–41)
Albumin: 3.9 g/dL (ref 3.5–5.0)
Alkaline Phosphatase: 73 U/L (ref 38–126)
Anion gap: 10 (ref 5–15)
BUN: 5 mg/dL — ABNORMAL LOW (ref 6–20)
CO2: 18 mmol/L — ABNORMAL LOW (ref 22–32)
Calcium: 8.9 mg/dL (ref 8.9–10.3)
Chloride: 107 mmol/L (ref 98–111)
Creatinine, Ser: 0.73 mg/dL (ref 0.61–1.24)
GFR, Estimated: >60 mL/min (ref >60)
Glucose, Bld: 170 mg/dL — ABNORMAL HIGH (ref 70–99)
Potassium: 4.5 mmol/L (ref 3.5–5.1)
Sodium: 135 mmol/L (ref 135–145)
Total Bilirubin: 0.5 mg/dL (ref 0.0–1.2)
Total Protein: 7.3 g/dL (ref 6.5–8.1)

## 2023-12-04 LAB — CBC
HCT: 35.4 % — ABNORMAL LOW (ref 39.0–52.0)
Hemoglobin: 11.3 g/dL — ABNORMAL LOW (ref 13.0–17.0)
MCH: 29.9 pg (ref 26.0–34.0)
MCHC: 31.9 g/dL (ref 30.0–36.0)
MCV: 93.7 fL (ref 80.0–100.0)
Platelets: 198 K/uL (ref 150–400)
RBC: 3.78 MIL/uL — ABNORMAL LOW (ref 4.22–5.81)
RDW: 15.7 % — ABNORMAL HIGH (ref 11.5–15.5)
WBC: 7.5 K/uL (ref 4.0–10.5)
nRBC: 0 % (ref 0.0–0.2)

## 2023-12-04 LAB — TROPONIN I (HIGH SENSITIVITY): Troponin I (High Sensitivity): 20 ng/L — ABNORMAL HIGH (ref <18)

## 2023-12-04 LAB — MAGNESIUM: Magnesium: 1.3 mg/dL — ABNORMAL LOW (ref 1.7–2.4)

## 2023-12-04 MED ORDER — MAGNESIUM SULFATE 4 GM/100ML IV SOLN
4.0000 g | Freq: Once | INTRAVENOUS | Status: AC
  Administered 2023-12-04: 4 g via INTRAVENOUS
  Filled 2023-12-04: qty 100

## 2023-12-04 MED ORDER — FERROUS SULFATE 325 (65 FE) MG PO TBEC
325.0000 mg | DELAYED_RELEASE_TABLET | Freq: Two times a day (BID) | ORAL | Status: DC
  Filled 2023-12-04 (×2): qty 1

## 2023-12-04 MED ORDER — FERROUS SULFATE 325 (65 FE) MG PO TABS
325.0000 mg | ORAL_TABLET | Freq: Two times a day (BID) | ORAL | Status: DC
  Administered 2023-12-04 – 2023-12-05 (×2): 325 mg via ORAL
  Filled 2023-12-04 (×2): qty 1

## 2023-12-04 MED ORDER — ENOXAPARIN SODIUM 40 MG/0.4ML IJ SOSY: 40.0000 mg | PREFILLED_SYRINGE | Freq: Every day | INTRAMUSCULAR | Status: DC

## 2023-12-04 MED ORDER — HYDRALAZINE HCL 25 MG PO TABS: 25.0000 mg | ORAL_TABLET | Freq: Four times a day (QID) | ORAL | Status: DC | PRN

## 2023-12-04 NOTE — Progress Notes (Addendum)
 RN was walking past room and heard a crashing sound coming from room. RN walked in and saw patient lying in the floor across from the foot of the bed. RN asked patient what happened and he stated I was trying to make a phone call and I passed out and fell. Patient also stated,my head hurts.. RN did assessment and no new injuries visible and no neuro changes. Patient falling asleep multiple times during assessment, this is baseline since admission yesterday afternoon. Kathrin, MD made aware, new orders for Q4 neuro checks, orthostatic vitals, and fall precautions. Patient refusing for RN to do orthostatic vitals. Patient told not to get up by himself and to call us  when he needs help. Bed alarm activated, 3 bed rails raised, fall mats placed on both sides of bed, and call light left within reach.

## 2023-12-04 NOTE — Telephone Encounter (Signed)
 I do realized that. Routed this because he has called with a request and to make his provider aware.

## 2023-12-04 NOTE — Consult Note (Addendum)
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 vasculitis or significant stenosis. Inflow: Mild atherosclerotic calcification. No aneurysmal dilatation or dissection. The iliac arteries are patent. Veins: No obvious venous abnormality within the limitations of this arterial phase study. Review of the MIP images confirms the above findings. NON-VASCULAR No intra-abdominal free air or free fluid. Hepatobiliary: The liver is unremarkable. No biliary ductal dilatation. The gallbladder is unremarkable. Pancreas: Coarse calcifications of the pancreas sequela of chronic pancreatitis. Similar appearance of a 4 cm distal pancreatic pseudocysts. Correlation with pancreatic enzymes recommended to exclude an acute on chronic pancreatitis. 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 This was present on the prior CT. Spleen: Normal in size without focal abnormality. Adrenals/Urinary Tract: The adrenal glands unremarkable. There is  no hydronephrosis on either side. The visualized ureters and urinary bladder appear unremarkable. Stomach/Bowel: There is no bowel obstruction or active inflammation. The appendix is normal. Lymphatic: No adenopathy. Reproductive: The prostate and seminal vesicles are grossly unremarkable. Other: None Musculoskeletal: No acute osseous pathology. Review of the MIP images confirms the above findings. IMPRESSION: 1. No acute intrathoracic, abdominal, or pelvic pathology. No aortic aneurysm or dissection. 2. Sequela of chronic pancreatitis. Correlation with pancreatic enzymes recommended to exclude an acute on chronic pancreatitis. 3. No bowel obstruction. Normal appendix. 4.  Emphysema (ICD10-J43.9). Electronically Signed   By: Vanetta Chou M.D.   On: 12/02/2023 11:35   DG Chest 2 View Result Date: 12/02/2023 CLINICAL DATA:  Chest pain EXAM: CHEST - 2 VIEW COMPARISON:  11/28/2023 FINDINGS: The lungs are clear without focal pneumonia, edema, pneumothorax or pleural effusion. The cardiopericardial silhouette is within normal limits for size. No acute bony abnormality. Telemetry leads overlie the chest. IMPRESSION: No active cardiopulmonary disease. Electronically Signed   By: Camellia Candle M.D.   On: 12/02/2023 06:20     Assessment and Plan:  Paroxysmal Atrial Fibrillation, now with RVR  ? 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 Recently, this diagnosis has been questioned. No records of ablation available in chart. EKGs over the years have shown short PR interval, no delta waves. During admission earlier this month, case was reviewed with EP and  there were no contraindications to BB  - Now, patient presented with chest pain and vomiting. Found to be in afib with RVR. K 3.4 and mag 1.1. Given IV procainamide  and IV mag. Converted to NSR  - Per telemetry, patient maintaining NSR  - suspect afib driven by low Mag and K in the setting of vomiting,  diarrhea. Also, patient continues to use alcohol   - Continue carvedilol  25 mg BID  - Patient has not been started on [facility] in the past due to low CHADS-VASc. Additionally, patient continues to abuse alcohol . I am not convinced that risks of AC outweigh the benefits. Will confirm with MD  - Zio was ordered 7/7 for evaluation of syncope. Patient has completed monitor and mailed back monitor. formal results pending   Elevated Trop  Chest pain  - Patient reports a squeezing feeling in his chest. Worsens with deep breaths, eating. Reproducible on palpation. - hsTn 6>27>20. Mild elevation and flat trend, not consistent with ACS. Suspect demand ischemia from afib with RVR  - CTA chest/abd/pelv 11/21/23 showed no coronary artery calcifications  - Echo from 7/4 showed EF 70-75%, no wall motion abnormalities  - Overall, presentation is atypical for a cardiac cause. Additionally, with patient's ongoing alcohol  and cocaine use, he is not a good candidate for cath/ischemic evaluation. No further cardiac workup planned at this time   Otherwise per primary  - Alcohol  abuse, cocaine abuse  - Chronic pancreatitis  - BPH  - Iron deficiency anemia  - GERD, duodenitis, history of PUD   Risk Assessment/Risk Scores:  CHA2DS2-VASc Score = 1   This indicates a 0.6% annual risk of stroke. The patient's score is based upon: CHF History: 0 HTN History: 1 Diabetes History: 0 Stroke History: 0 Vascular Disease History: 0 Age Score: 0 Gender Score: 0   For questions or updates, please contact [facility] Please consult [email or web address] for contact info under    Signed, Rollo FABIENE Louder, PA-C  12/04/2023 10:36 AM  Personally seen and examined. Agree with above.  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  Patient was given IV procainamide  and IV magnesium  and  converted to normal sinus rhythm.  This is the appropriate treatment for A-fib RVR in the setting of WPW.  No further recommendations at this time.  Continue with beta-blocker, carvedilol  with alpha blockade and strongly encourage cocaine cessation.  Troponin low and flat.  No ACS.  Please reach out if there are any other questions.  We will go ahead and sign off.  Oneil Parchment, MD

## 2023-12-04 NOTE — Plan of Care (Signed)
  Problem: Education: Goal: Knowledge of General Education information will improve Description: Including pain rating scale, medication(s)/side effects and non-pharmacologic comfort measures Outcome: Not Progressing   Problem: Health Behavior/Discharge Planning: Goal: Ability to manage health-related needs will improve Outcome: Not Progressing   Problem: Clinical Measurements: Goal: Ability to maintain clinical measurements within normal limits will improve Outcome: Progressing Goal: Will remain free from infection Outcome: Progressing Goal: Diagnostic test results will improve Outcome: Progressing Goal: Respiratory complications will improve Outcome: Progressing Goal: Cardiovascular complication will be avoided Outcome: Progressing   Problem: Activity: Goal: Risk for activity intolerance will decrease Outcome: Progressing   Problem: Nutrition: Goal: Adequate nutrition will be maintained Outcome: Progressing   Problem: Coping: Goal: Level of anxiety will decrease Outcome: Not Progressing   Problem: Elimination: Goal: Will not experience complications related to bowel motility Outcome: Progressing Goal: Will not experience complications related to urinary retention Outcome: Progressing   Problem: Pain Managment: Goal: General experience of comfort will improve and/or be controlled Outcome: Not Progressing   Problem: Safety: Goal: Ability to remain free from injury will improve Outcome: Progressing   Problem: Skin Integrity: Goal: Risk for impaired skin integrity will decrease Outcome: Progressing

## 2023-12-04 NOTE — Telephone Encounter (Signed)
 Copied from CRM [id number]. Topic: Clinical - Medical Advice >> Dec 04, 2023  1:43 PM Zane F wrote: Reason for CRM:   Patient is currently admitted to the [facility] In patient.    Patient stated he is looking to get in touch with Dr. Marsha Ada.   Patient was informed that he is receiving morphine  and tylenol  but states that is not the case.  Patient is in Room: 2C08 in the Heart Unit.   Please contact the hospital or patient if possible to assist.  Callback Number: [phone]

## 2023-12-04 NOTE — Progress Notes (Signed)
 PROGRESS NOTE  [name]:[id number] DOB: Oct 31, 1970   PCP: Celestia Rosaline SQUIBB, NP  Patient is from: Home.  DOA: 12/03/2023 LOS: 1  Chief complaints Chief Complaint  Patient presents with   Tachycardia     Brief Narrative / Interim history: 53 year old M with PMH of EtOH abuse, paroxysmal A-fib, WPW s/p ablation?, chronic pancreatitis, polysubstance use (cocaine, alcohol  and tobacco), recent left arm fracture and bipolar disorder presenting with chest tightness, vomiting, abdominal pain and found to be in A-fib with RVR with HR at 150s.  In ED, in RVR with HR to 150s on EKG.  K3.4.  Mg 1.1.  AST 47.  Bicarb 19.  AG 15.  Lipase normal.  Troponin 6>> 27.  EtOH level <15.  UDS positive for THC.  CXR without acute finding.  After discussion with cardiology, patient received IV procainamide  and converted to sinus rhythm.  CT angio chest, abdomen and pelvis without acute finding but sequela of chronic pancreatitis and emphysema..  Admitted for further care.   Subjective: Seen and examined earlier this morning.  No major events overnight or this morning.  Increased CIWA to 15 earlier this morning mainly due to agitation.  Looks calm this time.  Stated that his pancreas is hurting.  Also reports some chest pain.  He is asking for medication to help him relax, although he seems to struggle to keep his eyes open while talking to me.  Reports drinking about 3 beers a day.  Last drink Saturday evening.  Also reports smoking about half a pack a day.  He says he did not use cocaine in 2 weeks now.  Objective: Vitals:   12/04/23 0741 12/04/23 0743 12/04/23 0756 12/04/23 1131  BP: 134/80 134/80 134/80 (!) 132/93  Pulse: 83  (!) 105 84  Resp:  20  20  Temp:  98.4 F (36.9 C)  98.2 F (36.8 C)  TempSrc:  Oral  Oral  SpO2:  96%  94%  Weight:      Height:        Examination:  GENERAL: No apparent distress.  Appears frail. HEENT: MMM.  Vision and hearing grossly intact.  NECK:  Supple.  No apparent JVD.  RESP:  No IWOB.  Fair aeration bilaterally. CVS:  RRR. Heart sounds normal.  ABD/GI/GU: BS+. Abd soft, NTND.  MSK/EXT:  Moves extremities. No apparent deformity. No edema.  SKIN: no apparent skin lesion or wound NEURO: Awake but not alert.  Oriented.  No apparent focal neuro deficit. PSYCH: Calm. Normal affect.   Consultants:  Cardiology  Procedures: None  Microbiology summarized: None  Assessment and plan: Paroxysmal A-fib with RVR: In RVR with HR to 150s on arrival.  Due to EtOH and electrolyte derangement.  Reports compliance with Coreg .  Received IV procainamide  and converted to sinus rhythm.  Remains in sinus rhythm.  Has history of WPW although I was not able to confirm this on his EKGs.  Recent TTE on 7/4 without significant finding.  Seems patient was started on Zio patch after last hospitalization earlier this month.  CHA2DS2-VASc score 1 for HTN. -Consult cardiology given history of WPW -Follow results from recent heart monitor/Zio patch. -Not a candidate for anticoagulation with low CHADS2 score and high fall risk. -Optimize electrolytes   Elevated troponin: Likely demand ischemia from A-fib with RVR. -Recheck this morning   H/o alcohol  abuse/alcohol  withdrawal: Reports drinking about 3 beers a day.  Last drink the evening of 7/26.  EtOH level <15.  CIWA  score just from 11-15 persistently mainly due to agitation.  Currently looks calm.  Hardly stays awake. -Continue with as needed Ativan  -Continue multivitamin, folic acid  and thiamine . -Consult TOC  H/o chronic pancreatitis/chronic abdominal pain: Lipase within normal.  CT without acute finding.  Patient perseverating on IV pain medication -PTA Creon  and Carafate  - Encourage alcohol  cessation  Essential hypertension: BP within acceptable range now. -Currently on Coreg   Hypokalemia/hypomagnesemia - Monitor replenish as appropriate  Tobacco use disorder - Encourage smoking cessation -  Nicotine  patch  Polysubstance use: Alcohol , tobacco, marijuana and cocaine.  UDS positive for marijuana. - Encourage cessation - Consult TOC  Normocytic iron deficiency anemia: H&H stable.  Ferritin 20 earlier this month. -P.o. iron -Continue monitoring  GERD/duodenitis/history of PUD -Continue PPI  Generalized weakness/left arm fracture -Continue splint and outpatient follow-up with orthopedic surgery  -PT/OT eval   H/o BPH - Continue Flomax   Nutrition Body mass index is 18.26 kg/m. -Continue Ensure and Creon  -Consult dietitian          DVT prophylaxis:  Subcu Lovenox   Code Status: Full code-confirmed with patient Family Communication: None at bedside Level of care: Telemetry Cardiac Status is: Inpatient Remains inpatient appropriate because: A-fib with RVR, alcohol  withdrawal and electrolyte derangement   Final disposition: Home   55 minutes with more than 50% spent in reviewing records, counseling patient/family and coordinating care.   Sch Meds:  Scheduled Meds:  carvedilol   25 mg Oral BID WC   enoxaparin  (LOVENOX ) injection  40 mg Subcutaneous Q24H   feeding supplement  237 mL Oral BID BM   ferrous sulfate   325 mg Oral BID WC   folic acid   1 mg Oral Daily   lipase/protease/amylase  72,000 Units Oral TID WC   multivitamin with minerals  1 tablet Oral Daily   nicotine   21 mg Transdermal Daily   pantoprazole   40 mg Oral Daily   pneumococcal 20-valent conjugate vaccine  0.5 mL Intramuscular Tomorrow-1000   sucralfate   1 g Oral TID WC   tamsulosin   0.4 mg Oral Daily   thiamine   100 mg Oral Daily   Or   thiamine   100 mg Intravenous Daily   Continuous Infusions:  magnesium  sulfate bolus IVPB 4 g (12/04/23 1130)   PRN Meds:.acetaminophen , lipase/protease/amylase **AND** lipase/protease/amylase, LORazepam  **OR** LORazepam , morphine  injection, ondansetron , mouth rinse, polyethylene glycol  Antimicrobials: Anti-infectives (From admission, onward)     None        I have personally reviewed the following labs and images: CBC: Recent Labs  Lab 11/28/23 1622 12/02/23 0535 12/03/23 0816 12/04/23 0915  WBC 5.3 8.4 9.1 7.5  HGB 11.5* 10.5* 10.9* 11.3*  HCT 37.2* 32.0* 33.7* 35.4*  MCV 96.9 91.7 91.6 93.7  PLT 185 207 203 198   BMP &GFR Recent Labs  Lab 11/28/23 1622 12/02/23 0535 12/03/23 0816 12/04/23 0915  NA 142 137 139 135  K 4.1 3.7 3.4* 4.5  CL 109 110 105 107  CO2 17* 19* 19* 18*  GLUCOSE 87 103* 173* 170*  BUN 7 12 10  5*  CREATININE 1.13 0.76 1.04 0.73  CALCIUM  7.7* 8.2* 8.4* 8.9  MG  --   --  1.1* 1.3*   Estimated Creatinine Clearance: 81.4 mL/min (by C-G formula based on SCr of 0.73 mg/dL). Liver & Pancreas: Recent Labs  Lab 11/28/23 1622 12/02/23 0535 12/03/23 0816 12/04/23 0915  AST 47* 51* 47* 45*  ALT 25 28 26 28   ALKPHOS 67 66 62 73  BILITOT 0.7 0.8 0.6  0.5  PROT 7.2 6.9 6.7 7.3  ALBUMIN  3.8 3.7 3.4* 3.9   Recent Labs  Lab 11/28/23 1622 12/02/23 0535 12/03/23 0816  LIPASE 25 31 33   No results for input(s): AMMONIA in the last 168 hours. Diabetic: No results for input(s): HGBA1C in the last 72 hours. No results for input(s): GLUCAP in the last 168 hours. Cardiac Enzymes: No results for input(s): CKTOTAL, CKMB, CKMBINDEX, TROPONINI in the last 168 hours. No results for input(s): PROBNP in the last 8760 hours. Coagulation Profile: No results for input(s): INR, PROTIME in the last 168 hours. Thyroid  Function Tests: No results for input(s): TSH, T4TOTAL, FREET4, T3FREE, THYROIDAB in the last 72 hours. Lipid Profile: No results for input(s): CHOL, HDL, LDLCALC, TRIG, CHOLHDL, LDLDIRECT in the last 72 hours. Anemia Panel: No results for input(s): VITAMINB12, FOLATE, FERRITIN, TIBC, IRON, RETICCTPCT in the last 72 hours. Urine analysis:    Component Value Date/Time   COLORURINE COLORLESS (A) 11/06/2023 2346   APPEARANCEUR CLEAR  11/06/2023 2346   LABSPEC 1.002 (L) 11/06/2023 2346   PHURINE 6.0 11/06/2023 2346   GLUCOSEU NEGATIVE 11/06/2023 2346   HGBUR NEGATIVE 11/06/2023 2346   HGBUR negative 04/30/2010 1020   BILIRUBINUR NEGATIVE 11/06/2023 2346   BILIRUBINUR negative 09/06/2019 1649   KETONESUR NEGATIVE 11/06/2023 2346   PROTEINUR NEGATIVE 11/06/2023 2346   UROBILINOGEN 0.2 09/06/2019 1649   UROBILINOGEN 0.2 03/15/2015 0508   NITRITE NEGATIVE 11/06/2023 2346   LEUKOCYTESUR NEGATIVE 11/06/2023 2346   Sepsis Labs: Invalid input(s): PROCALCITONIN, LACTICIDVEN  Microbiology: Recent Results (from the past 240 hours)  MRSA Next Gen by PCR, Nasal     Status: None   Collection Time: 12/03/23  5:21 PM   Specimen: Nasal Mucosa; Nasal Swab  Result Value Ref Range Status   MRSA by PCR Next Gen NOT DETECTED NOT DETECTED Final    Comment: (NOTE) The GeneXpert MRSA Assay (FDA approved for NASAL specimens only), is one component of a comprehensive MRSA colonization surveillance program. It is not intended to diagnose MRSA infection nor to guide or monitor treatment for MRSA infections. Test performance is not FDA approved in patients less than 37 years old. Performed at [facility], [address] [place], [place] [address]     Radiology Studies: No results found.    Keriana Sarsfield T. Dymon [place] [facility]  If 7PM-7AM, please contact night-coverage [email or web address] 12/04/2023, 12:26 PM

## 2023-12-04 NOTE — Telephone Encounter (Signed)
 Patient is in the hospital

## 2023-12-04 NOTE — Telephone Encounter (Signed)
 Pt is currently admitted.

## 2023-12-04 NOTE — Evaluation (Signed)
 Physical Therapy Evaluation Patient Details Name: [name] MRN: [id number] DOB: 12-08-1970 Today's Date: 12/04/2023  History of Present Illness  53 year old M presenting 7/27 with chest tightness, vomiting, abdominal pain and found to be in A-fib with RVR with HR at 150s. PMH of EtOH abuse, paroxysmal A-fib, WPW s/p ablation?, chronic pancreatitis, polysubstance use (cocaine, alcohol  and tobacco), recent left arm fracture and bipolar disorder.  Clinical Impression  Pt admitted with above diagnosis. Reports independent but falling PTA due to syncopal episodes. Prodrome of black spots in his vision prior to LOC. Denies any symptoms while working with therapy this afternoon. Mod I with bed mobility, supervision for transfers, and CGA while ambulating with mild instability noted. May benefit from [facility] at d/c pending progress. Pt currently with functional limitations due to the deficits listed below (see PT Problem List). Pt will benefit from acute skilled PT to increase their independence and safety with mobility to allow discharge.           If plan is discharge home, recommend the following: A little help with walking and/or transfers;A little help with bathing/dressing/bathroom;Assistance with cooking/housework;Direct supervision/assist for medications management;Direct supervision/assist for financial management;Assist for transportation;Help with stairs or ramp for entrance;Supervision due to cognitive status   Can travel by private vehicle        Equipment Recommendations Other (comment) (Possibly SPC depending on progress before dc.)  Recommendations for Other Services       Functional Status Assessment Patient has had a recent decline in their functional status and demonstrates the ability to make significant improvements in function in a reasonable and predictable amount of time.     Precautions / Restrictions Precautions Precautions: Fall;Other (comment) Recall of  Precautions/Restrictions: Impaired Precaution/Restrictions Comments: impulsive, syncopal episodes - WPW syndrome, monitor HR Restrictions Weight Bearing Restrictions Per Provider Order: No      Mobility  Bed Mobility Overal bed mobility: Modified Independent             General bed mobility comments: extra time no assist needed    Transfers Overall transfer level: Needs assistance Equipment used: None Transfers: Sit to/from Stand Sit to Stand: Supervision           General transfer comment: Supervision for safety, performed x2 from bed, denies dizziness. Increased sway but able to self correct.    Ambulation/Gait Ambulation/Gait assistance: Contact guard assist Gait Distance (Feet): 115 Feet Assistive device: IV Pole Gait Pattern/deviations: Step-through pattern, Decreased stride length, Drifts right/left Gait velocity: dec Gait velocity interpretation: <1.8 ft/sec, indicate of risk for recurrent falls   General Gait Details: Denies AD but prefers to hold IV pole while ambulating. Shows minor deviations in gait from straight path, no physical assist required. Denies dizziness. CGA for safety. HR in 70s.  Stairs            Wheelchair Mobility     Tilt Bed    Modified Rankin (Stroke Patients Only)       Balance Overall balance assessment: Mild deficits observed, not formally tested                                           Pertinent Vitals/Pain Pain Assessment Pain Assessment: No/denies pain    Home Living Family/patient expects to be discharged to:: Private residence Living Arrangements:  (Family) Available Help at Discharge: Family;Available PRN/intermittently Type of Home: House Home Access: Stairs to  enter Entrance Stairs-Rails: Right Entrance Stairs-Number of Steps: 8 Alternate Level Stairs-Number of Steps: 8 Home Layout: Multi-level;Bed/bath upstairs Home Equipment: None Additional Comments: Conflicting information  from prior hx taken    Prior Function Prior Level of Function : Independent/Modified Independent;History of Falls (last six months)             Mobility Comments: Reports ind but syncopal episodes ADLs Comments: ind     Extremity/Trunk Assessment   Upper Extremity Assessment Upper Extremity Assessment: Defer to OT evaluation    Lower Extremity Assessment Lower Extremity Assessment: Generalized weakness       Communication   Communication Communication: No apparent difficulties    Cognition Arousal: Alert, Lethargic (Initially lethargic but more alert with activity.) Behavior During Therapy: WFL for tasks assessed/performed   PT - Cognitive impairments: No family/caregiver present to determine baseline, Awareness, Memory, Attention, Problem solving, Safety/Judgement                         Following commands: Intact       Cueing Cueing Techniques: Verbal cues     General Comments General comments (skin integrity, edema, etc.): See orthostatics tab    Exercises     Assessment/Plan    PT Assessment Patient needs continued PT services  PT Problem List Decreased strength;Decreased activity tolerance;Decreased balance;Decreased mobility;Decreased cognition;Decreased knowledge of use of DME;Decreased safety awareness;Decreased knowledge of precautions;Cardiopulmonary status limiting activity       PT Treatment Interventions DME instruction;Gait training;Stair training;Functional mobility training;Therapeutic activities;Therapeutic exercise;Balance training;Neuromuscular re-education;Cognitive remediation;Patient/family education    PT Goals (Current goals can be found in the Care Plan section)  Acute Rehab PT Goals Patient Stated Goal: Go home PT Goal Formulation: With patient Time For Goal Achievement: 12/18/23 Potential to Achieve Goals: Good    Frequency Min 2X/week     Co-evaluation               AM-PAC PT 6 Clicks Mobility   Outcome Measure Help needed turning from your back to your side while in a flat bed without using bedrails?: None Help needed moving from lying on your back to sitting on the side of a flat bed without using bedrails?: None Help needed moving to and from a bed to a chair (including a wheelchair)?: A Little Help needed standing up from a chair using your arms (e.g., wheelchair or bedside chair)?: A Little Help needed to walk in hospital room?: A Little Help needed climbing 3-5 steps with a railing? : A Little 6 Click Score: 20    End of Session   Activity Tolerance: Patient tolerated treatment well Patient left: in bed;with call bell/phone within reach;with bed alarm set Nurse Communication: Mobility status PT Visit Diagnosis: Unsteadiness on feet (R26.81);Other abnormalities of gait and mobility (R26.89);Repeated falls (R29.6);Muscle weakness (generalized) (M62.81);History of falling (Z91.81);Difficulty in walking, not elsewhere classified (R26.2);Other symptoms and signs involving the nervous system (R29.898)    Time: 8595-8567 PT Time Calculation (min) (ACUTE ONLY): 28 min   Charges:   PT Evaluation $PT Eval Low Complexity: 1 Low PT Treatments $Gait Training: 8-22 mins PT General Charges $$ ACUTE PT VISIT: 1 Visit         Leontine Roads, PT, DPT [facility]  Rehabilitation Services Physical Therapist Office: [phone] Website: [email or web address]   Leontine GORMAN Roads 12/04/2023, 3:29 PM

## 2023-12-04 NOTE — Telephone Encounter (Signed)
 Called pt to confirm appt. Pt did not answer and LVM

## 2023-12-05 ENCOUNTER — Ambulatory Visit (INDEPENDENT_AMBULATORY_CARE_PROVIDER_SITE_OTHER): Payer: MEDICAID | Admitting: Primary Care

## 2023-12-05 ENCOUNTER — Telehealth (INDEPENDENT_AMBULATORY_CARE_PROVIDER_SITE_OTHER): Payer: Self-pay | Admitting: Primary Care

## 2023-12-05 ENCOUNTER — Other Ambulatory Visit (HOSPITAL_COMMUNITY): Payer: Self-pay

## 2023-12-05 DIAGNOSIS — I4891 Unspecified atrial fibrillation: Secondary | ICD-10-CM | POA: Diagnosis not present

## 2023-12-05 LAB — COMPREHENSIVE METABOLIC PANEL WITH GFR
ALT: 24 U/L (ref 0–44)
AST: 27 U/L (ref 15–41)
Albumin: 3.2 g/dL — ABNORMAL LOW (ref 3.5–5.0)
Alkaline Phosphatase: 59 U/L (ref 38–126)
Anion gap: 9 (ref 5–15)
BUN: <5 mg/dL — ABNORMAL LOW (ref 6–20)
CO2: 22 mmol/L (ref 22–32)
Calcium: 8.8 mg/dL — ABNORMAL LOW (ref 8.9–10.3)
Chloride: 107 mmol/L (ref 98–111)
Creatinine, Ser: 0.67 mg/dL (ref 0.61–1.24)
GFR, Estimated: >60 mL/min (ref >60)
Glucose, Bld: 109 mg/dL — ABNORMAL HIGH (ref 70–99)
Potassium: 4 mmol/L (ref 3.5–5.1)
Sodium: 138 mmol/L (ref 135–145)
Total Bilirubin: 0.4 mg/dL (ref 0.0–1.2)
Total Protein: 6.1 g/dL — ABNORMAL LOW (ref 6.5–8.1)

## 2023-12-05 LAB — CBC
HCT: 31.8 % — ABNORMAL LOW (ref 39.0–52.0)
Hemoglobin: 10.3 g/dL — ABNORMAL LOW (ref 13.0–17.0)
MCH: 29.9 pg (ref 26.0–34.0)
MCHC: 32.4 g/dL (ref 30.0–36.0)
MCV: 92.4 fL (ref 80.0–100.0)
Platelets: 168 K/uL (ref 150–400)
RBC: 3.44 MIL/uL — ABNORMAL LOW (ref 4.22–5.81)
RDW: 15.5 % (ref 11.5–15.5)
WBC: 8.4 K/uL (ref 4.0–10.5)
nRBC: 0 % (ref 0.0–0.2)

## 2023-12-05 LAB — MAGNESIUM: Magnesium: 1.3 mg/dL — ABNORMAL LOW (ref 1.7–2.4)

## 2023-12-05 MED ORDER — MAGNESIUM SULFATE 4 GM/100ML IV SOLN
4.0000 g | Freq: Once | INTRAVENOUS | Status: AC
  Administered 2023-12-05: 4 g via INTRAVENOUS
  Filled 2023-12-05: qty 100

## 2023-12-05 MED ORDER — CHLORDIAZEPOXIDE HCL 10 MG PO CAPS: ORAL_CAPSULE | ORAL | 0 refills | Status: AC

## 2023-12-05 NOTE — Telephone Encounter (Signed)
 Provider does not prescribe any narcotics at all. Pt is currently admitted

## 2023-12-05 NOTE — Discharge Summary (Signed)
 Physician Discharge Summary  [name] FMW:[id number] DOB: [birth year] DOA: 12/03/2023  PCP: Celestia Rosaline SQUIBB, NP  Admit date: 12/03/2023 Discharge date: 12/05/23  Admitted From: Home. Disposition: Home. Recommendations for Outpatient Follow-up:  Outpatient follow-up with PCP Consider referral to pain clinic Check CMP and CBC at follow-up Please follow up on the following pending results: None  Home Health: [facility] PT/OT Equipment/Devices: Cane  Discharge Condition: Stable CODE STATUS: Full code Diet Orders (From admission, onward)     Start     Ordered   12/05/23 0000  Diet - low sodium heart healthy        12/05/23 0914   12/03/23 1226  Diet full liquid Room service appropriate? Yes; Fluid consistency: Thin  Diet effective now       Question Answer Comment  Room service appropriate? Yes   Fluid consistency: Thin      12/03/23 1227             Follow-up Information     Celestia Rosaline SQUIBB, NP. Schedule an appointment as soon as possible for a visit in 1 week(s).   Specialty: Internal Medicine Contact information: [address] [place] [address] [phone]                 Hospital course 53 year old M with PMH of EtOH abuse, paroxysmal A-fib, WPW s/p ablation?, chronic pancreatitis, polysubstance use (cocaine, alcohol  and tobacco), recent left arm fracture and bipolar disorder presenting with chest tightness, vomiting, abdominal pain and found to be in A-fib with RVR with HR at 150s.   In ED, in RVR with HR to 150s on EKG.  K3.4.  Mg 1.1.  AST 47.  Bicarb 19.  AG 15.  Lipase normal.  Troponin 6>> 27.  EtOH level <15.  UDS positive for THC.  CXR without acute finding.  After discussion with cardiology, patient received IV procainamide  and converted to sinus rhythm.  CT angio chest, abdomen and pelvis without acute finding but sequela of chronic pancreatitis and emphysema..  Admitted for further care.   The next day, he remained in sinus  rhythm.  Evaluated by cardiology and cleared for discharge.  Magnesium  remains low.  IV magnesium  supplementation.  CIWA elevated mainly due to agitation.  Patient perseverating on pain medication.   On the day of discharge, remained in sinus rhythm.  Patient continues to perseverate on pain medication and requesting prescription for oxycodone  for his chronic pancreatitis.  He becomes belligerent, dismissive and cursing when he was advised to discuss opiate prescription with his PCP since his pancreatitis and and abdominal pain is chronic issue.  On review of narcotic database, patient filled oxycodone  5 times within this month with quantities ranging from 5-15.  I am afraid that issuing prescriptions for opiate could serve as a motivating factor for frequent ED visit and hospitalization.  It is also dangerous in the setting of ongoing polysubstance use.  See individual problem list below for more.   Problems addressed during this hospitalization Paroxysmal A-fib with RVR: In RVR with HR to 150s on arrival.  Due to EtOH and electrolyte derangement.  Reports compliance with Coreg .  Received IV procainamide  and converted to sinus rhythm.  Remains in sinus rhythm.  Has history of WPW although I was not able to confirm this on his EKGs.  Recent TTE on 7/4 without significant finding.  Seems patient was started on Zio patch after last hospitalization earlier this month.  CHA2DS2-VASc score 1 for HTN.  Remained in  sinus rhythm.  Cleared for discharge by cardiology.   Elevated troponin: Mild without significant delta.  Likely demand ischemia from A-fib with RVR.   H/o alcohol  abuse/alcohol  withdrawal: Reports drinking about 3 beers a day.  Last drink the evening of 7/26.  EtOH level <15.  CIWA score just from 11-15 persistently mainly due to agitation which is mainly behavioral.  Patient likes to go to AA to help with alcohol  cessation but he says he needs sponsors for ride.. -Librium  taper as below -Continue  multivitamin, folic acid  and thiamine . -Consult TOC for substance abuse resources   H/o chronic pancreatitis/chronic abdominal pain: Lipase and LFT within normal.  CT without acute finding.  Patient perseverated on IV pain medication.  Became belligerent, dismissive and cursing when advised to discuss opiate prescription with PCP. - Continue home Creon    Essential hypertension: BP within acceptable range now. - Continue home Coreg    Hypokalemia/hypomagnesemia: Hypokalemia resolved.  Received IV magnesium  sulfate 4 g prior to discharge   Tobacco use disorder - Encourage smoking cessation - Nicotine  patch   Polysubstance use: Alcohol , tobacco, marijuana and cocaine.  UDS positive for marijuana. - Encourage cessation - Consult TOC for resources and counseling   Normocytic iron deficiency anemia: H&H stable.  Ferritin 20 earlier this month. - Recheck CBC at follow-up   GERD/duodenitis/history of PUD -Continue PPI   Generalized weakness/left arm fracture -Continue splint and outpatient follow-up with orthopedic surgery  - HH PT/OT/cane ordered.   H/o BPH - Continue Flomax    Nutrition Body mass index is 18.26 kg/m.           Consultations: Cardiology  Time spent 35  minutes  Vital signs Vitals:   12/04/23 2240 12/05/23 0324 12/05/23 0738 12/05/23 0900  BP: 127/80 (!) 129/96 (!) 134/98 Comment: Refused to have vitals taken  Pulse: 95 77 78 Comment: Refused to have vitals taken  Temp: 98.5 F (36.9 C) Comment: Pt refused. 97.6 F (36.4 C)   Resp: 18 20 15    Height:      Weight:      SpO2: 97% 98%    TempSrc: Oral Oral Oral   BMI (Calculated):         Discharge exam  GENERAL: No apparent distress.  Appears frail. HEENT: MMM.  Vision and hearing grossly intact.  NECK: Supple.  No apparent JVD.  RESP:  No IWOB.  Fair aeration bilaterally. CVS:  RRR. Heart sounds normal.  ABD/GI/GU: BS+. Abd soft, NTND.  MSK/EXT:  Moves extremities. No apparent deformity. No  edema.  SKIN: no apparent skin lesion or wound NEURO: Awake and alert. Oriented appropriately.  No apparent focal neuro deficit. PSYCH: Aggressive, belligerent, dismissive  Discharge Instructions Discharge Instructions     Amb referral to AFIB Clinic   Complete by: As directed    Diet - low sodium heart healthy   Complete by: As directed    Discharge instructions   Complete by: As directed    It has been a pleasure taking care of you!  You were hospitalized due to atrial fibrillation with rapid heart rate likely due to drinking alcohol .  It is very important that you stop drinking alcohol .  Continue taking your medications as prescribed.  Follow-up with your primary care doctor in 1 to 2 weeks or sooner if needed.  It is important that you quit smoking cigarettes.  You may use nicotine  patch to help you quit smoking.  Nicotine  patch is available over-the-counter.  You may also discuss other options  to help you quit smoking with your primary care doctor. You can also talk to professional counselors at 1-800-QUIT-NOW (636)296-2890) for free smoking cessation counseling.     Take care,   Increase activity slowly   Complete by: As directed       Allergies as of 12/05/2023       Reactions   Robaxin  [methocarbamol ] Other (See Comments)   Myoclonus    Bayer Aspirin  [aspirin ] Other (See Comments)   Unknown reaction   Trazodone  And Nefazodone Other (See Comments)   Muscle spasms   Adhesive [tape] Itching   Latex Itching   Toradol  [ketorolac  Tromethamine ] Other (See Comments)   Hx ulcers   Contrast Media [iodinated Contrast Media] Hives   Fish Allergy Nausea And Vomiting, Rash   Reglan  [metoclopramide ] Other (See Comments)   Muscle spasms   Salmon [fish Oil] Nausea And Vomiting, Rash   Shellfish Allergy Nausea And Vomiting, Rash        Medication List     TAKE these medications    acetaminophen  500 MG tablet Commonly known as: TYLENOL  Take 1,000 mg by mouth daily as  needed for mild pain (pain score 1-3) or moderate pain (pain score 4-6).   carvedilol  25 MG tablet Commonly known as: COREG  Take 1 tablet (25 mg total) by mouth 2 (two) times daily with a meal.   CertaVite/Antioxidants Tabs Take 1 tablet by mouth daily. What changed: how much to take   chlordiazePOXIDE  10 MG capsule Commonly known as: LIBRIUM  Take 1 capsule (10 mg total) by mouth 3 (three) times daily for 2 days, THEN 1 capsule (10 mg total) in the morning and at bedtime for 2 days, THEN 1 capsule (10 mg total) daily for 2 days. Start taking on: December 05, 2023   Creon  36000-114000 units Cpep capsule Generic drug: lipase/protease/amylase Take 2 capsules (72,000 Units total) by mouth 3 (three) times daily with meals. May also take 1 capsule (36,000 Units total) as needed (with snacks).   famotidine  40 MG tablet Commonly known as: PEPCID  Take 1 tablet (40 mg total) by mouth daily. What changed: when to take this   folic acid  1 MG tablet Commonly known as: FOLVITE  Take 1 tablet (1 mg total) by mouth daily. What changed: when to take this   lidocaine  2 % solution Commonly known as: XYLOCAINE  Use as directed 15 mLs in the mouth or throat every 6 (six) hours as needed for mouth pain.   loperamide  2 MG tablet Commonly known as: IMODIUM  A-D Take 2 mg by mouth 4 (four) times daily as needed for diarrhea or loose stools.   magnesium  oxide 400 (240 Mg) MG tablet Commonly known as: MAG-OX Take 1 tablet (400 mg total) by mouth daily.   nicotine  21 mg/24hr patch Commonly known as: NICODERM CQ  - dosed in mg/24 hours Place 1 patch (21 mg total) onto the skin daily.   ondansetron  4 MG disintegrating tablet Commonly known as: ZOFRAN -ODT Take 1 tablet (4 mg total) by mouth every 8 (eight) hours as needed.   oxyCODONE -acetaminophen  5-325 MG tablet Commonly known as: Percocet Take 1 tablet by mouth every 4 (four) hours as needed.   pantoprazole  40 MG tablet Commonly known as:  PROTONIX  Take 1 tablet (40 mg total) by mouth daily.   polyethylene glycol powder 17 GM/SCOOP powder Commonly known as: GLYCOLAX /MIRALAX  Take 17 g by mouth daily as needed for mild constipation.   sucralfate  1 g tablet Commonly known as: CARAFATE  Take 1 g by mouth 3 (  three) times daily with meals.   tamsulosin  0.4 MG Caps capsule Commonly known as: FLOMAX  Take 1 capsule (0.4 mg total) by mouth daily.         Procedures/Studies:   DG Chest Portable 1 View Result Date: 12/03/2023 CLINICAL DATA:  Chest pain EXAM: PORTABLE CHEST 1 VIEW COMPARISON:  12/02/2023 FINDINGS: The lungs are clear without focal pneumonia, edema, pneumothorax or pleural effusion. The cardiopericardial silhouette is within normal limits for size. No acute bony abnormality. Telemetry leads overlie the chest. IMPRESSION: No active disease. Electronically Signed   By: Camellia Candle M.D.   On: 12/03/2023 09:22   CT Angio Chest/Abd/Pel for Dissection W and/or Wo Contrast Result Date: 12/02/2023 CLINICAL DATA:  Chest pain radiating to the back. Acute aortic syndrome suspected. EXAM: CT ANGIOGRAPHY CHEST, ABDOMEN AND PELVIS TECHNIQUE: Non-contrast CT of the chest was initially obtained. Multidetector CT imaging through the chest, abdomen and pelvis was performed using the standard protocol during bolus administration of intravenous contrast. Multiplanar reconstructed images and MIPs were obtained and reviewed to evaluate the vascular anatomy. RADIATION DOSE REDUCTION: This exam was performed according to the departmental dose-optimization program which includes automated exposure control, adjustment of the mA and/or kV according to patient size and/or use of iterative reconstruction technique. CONTRAST:  OMNIPAQUE  IOHEXOL  350 MG/ML SOLN COMPARISON:  CT dated 11/21/2023. FINDINGS: CTA CHEST FINDINGS Cardiovascular: There is no cardiomegaly. Trace pericardial effusion. The thoracic aorta is unremarkable. The origins of the  great vessels of the aortic arch appear patent. No pulmonary artery embolus identified. Mediastinum/Nodes: No hilar or mediastinal adenopathy. The esophagus is grossly unremarkable no mediastinal fluid collection. Lungs/Pleura: Background of emphysema. No focal consolidation, pleural effusion or pneumothorax. The central airways are patent. Musculoskeletal: No acute osseous pathology. Review of the MIP images confirms the above findings. CTA ABDOMEN AND PELVIS FINDINGS VASCULAR Aorta: Mild atherosclerotic calcification. No aneurysmal dilatation or dissection. No periaortic fluid collection. Celiac: Patent without evidence of aneurysm, dissection, vasculitis or significant stenosis. SMA: Patent without evidence of aneurysm, dissection, vasculitis or significant stenosis. Renals: Both renal arteries are patent without evidence of aneurysm, dissection, vasculitis, fibromuscular dysplasia or significant stenosis. IMA: Patent without evidence of aneurysm, dissection, vasculitis or significant stenosis. Inflow: Mild atherosclerotic calcification. No aneurysmal dilatation or dissection. The iliac arteries are patent. Veins: No obvious venous abnormality within the limitations of this arterial phase study. Review of the MIP images confirms the above findings. NON-VASCULAR No intra-abdominal free air or free fluid. Hepatobiliary: The liver is unremarkable. No biliary ductal dilatation. The gallbladder is unremarkable. Pancreas: Coarse calcifications of the pancreas sequela of chronic pancreatitis. Similar appearance of a 4 cm distal pancreatic pseudocysts. Correlation with pancreatic enzymes recommended to exclude an acute on chronic pancreatitis. 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 This was present on the prior CT. Spleen: Normal in size without focal abnormality. Adrenals/Urinary Tract: The adrenal glands unremarkable. There is no hydronephrosis  on either side. The visualized ureters and urinary bladder appear unremarkable. Stomach/Bowel: There is no bowel obstruction or active inflammation. The appendix is normal. Lymphatic: No adenopathy. Reproductive: The prostate and seminal vesicles are grossly unremarkable. Other: None Musculoskeletal: No acute osseous pathology. Review of the MIP images confirms the above findings. IMPRESSION: 1. No acute intrathoracic, abdominal, or pelvic pathology. No aortic aneurysm or dissection. 2. Sequela of chronic pancreatitis. Correlation with pancreatic enzymes recommended to exclude an acute on chronic pancreatitis. 3. No bowel obstruction. Normal appendix.  4.  Emphysema (ICD10-J43.9). Electronically Signed   By: Vanetta Chou M.D.   On: 12/02/2023 11:35   DG Chest 2 View Result Date: 12/02/2023 CLINICAL DATA:  Chest pain EXAM: CHEST - 2 VIEW COMPARISON:  11/28/2023 FINDINGS: The lungs are clear without focal pneumonia, edema, pneumothorax or pleural effusion. The cardiopericardial silhouette is within normal limits for size. No acute bony abnormality. Telemetry leads overlie the chest. IMPRESSION: No active cardiopulmonary disease. Electronically Signed   By: Camellia Candle M.D.   On: 12/02/2023 06:20   DG Chest 2 View Result Date: 11/28/2023 CLINICAL DATA:  Chest pain. EXAM: CHEST - 2 VIEW COMPARISON:  November 21, 2023. FINDINGS: The heart size and mediastinal contours are within normal limits. Both lungs are clear. The visualized skeletal structures are unremarkable. IMPRESSION: No active cardiopulmonary disease. Electronically Signed   By: Lynwood Landy Raddle M.D.   On: 11/28/2023 16:13   CT Angio Chest/Abd/Pel for Dissection W and/or Wo Contrast Result Date: 11/21/2023 CLINICAL DATA:  Chest and abdominal pain. Suspected acute aortic syndrome. Recent CT abdomen/pelvis 11/07/2023 EXAM: CT ANGIOGRAPHY CHEST, ABDOMEN AND PELVIS TECHNIQUE: Non-contrast CT of the chest was initially obtained. Multidetector CT imaging  through the chest, abdomen and pelvis was performed using the standard protocol during bolus administration of intravenous contrast. Multiplanar reconstructed images and MIPs were obtained and reviewed to evaluate the vascular anatomy. RADIATION DOSE REDUCTION: This exam was performed according to the departmental dose-optimization program which includes automated exposure control, adjustment of the mA and/or kV according to patient size and/or use of iterative reconstruction technique. CONTRAST:  OMNIPAQUE  IOHEXOL  350 MG/ML SOLN COMPARISON:  CT abdomen/pelvis 11/07/2023 FINDINGS: CTA CHEST FINDINGS Cardiovascular: The heart is normal in size. No pericardial effusion. The aorta is normal in caliber. No dissection or atherosclerotic calcification. The branch vessels are patent. No coronary artery calcifications are noted. The pulmonary arteries are normal. No filling defects to suggest pulmonary emboli. Mediastinum/Nodes: No mediastinal or hilar mass or lymphadenopathy. Small scattered lymph nodes are noted. The esophagus is unremarkable. The thyroid  gland is normal. Lungs/Pleura: Underlying emphysematous changes and areas of pulmonary scarring. No infiltrates, edema or effusions. No worrisome pulmonary lesions or pulmonary nodules. No pneumothorax. Musculoskeletal: The bony thorax is intact. No bone lesions fractures. Review of the MIP images confirms the above findings. CTA ABDOMEN AND PELVIS FINDINGS VASCULAR Aorta: Normal caliber. No dissection. Moderate distal aortic atherosclerotic calcification. Celiac: Mild impingement/compression of the the celiac axis by the median arcuate ligament no significant stenosis or occlusion. SMA: Normal Renals: Normal IMA: Patent Inflow: Moderate atherosclerotic calcifications involving both iliac arteries but no significant stenosis. Veins: No significant findings. Review of the MIP images confirms the above findings. NON-VASCULAR Hepatobiliary: No hepatic lesions or  intrahepatic biliary dilatation. The gallbladder is grossly normal. No common bile duct dilatation. Pancreas: Stable changes of chronic pancreatitis with scattered pancreatic calcifications. Unchanged 4.0 cm and 1.2 cm cm pseudocysts in the pancreatic body/tail junction region. Stable marked atrophy of the pancreatic tail. Spleen: Normal in size without focal abnormality. Adrenals/Urinary Tract: The adrenal glands and kidneys are. The bladder is normal. Stomach/Bowel: The stomach, duodenum, small bowel and colon are grossly normal without oral contrast. No inflammatory changes, mass lesions or obstructive findings. The appendix is normal. Lymphatic: No abdominal or pelvic lymphadenopathy. Reproductive: The prostate gland and seminal vesicles unremarkable. Mild prostate gland enlargement. Other: No pelvic mass or adenopathy. No free pelvic fluid collections. No inguinal mass or adenopathy. No abdominal wall hernia or subcutaneous lesions. Musculoskeletal: No  significant bony findings. Review of the MIP images confirms the above findings. IMPRESSION: 1. Normal caliber thoracic and abdominal aorta. No aneurysm or dissection. Moderate distal aortic and iliac artery calcifications 2. No CT findings for pulmonary embolism. 3. Underlying emphysematous changes and areas of pulmonary scarring. No acute pulmonary findings or worrisome pulmonary lesions. 4. Stable changes of chronic pancreatitis with unchanged pseudocysts in the pancreatic body/tail junction region. 5. No acute abdominal/pelvic findings, mass lesions or adenopathy. Emphysema (ICD10-J43.9). Electronically Signed   By: MYRTIS Stammer M.D.   On: 11/21/2023 22:32   DG Chest 2 View Result Date: 11/21/2023 CLINICAL DATA:  cp EXAM: CHEST - 2 VIEW COMPARISON:  Chest x-ray 11/17/2023 FINDINGS: Wireless device overlies the left chest. The heart and mediastinal contours are within normal limits. No focal consolidation. No pulmonary edema. No pleural effusion. No  pneumothorax. No acute osseous abnormality. IMPRESSION: No active cardiopulmonary disease. Electronically Signed   By: Morgane  Naveau M.D.   On: 11/21/2023 14:01   DG Chest 2 View Result Date: 11/17/2023 CLINICAL DATA:  Chest pains. EXAM: CHEST - 2 VIEW COMPARISON:  PA and lateral chest 11/06/2023 FINDINGS: The heart size and mediastinal contours are within normal limits. An electronic device overlies the anterior left chest. Both lungs are clear. The visualized skeletal structures are intact but somewhat demineralized. There are multiple overlying monitor wires. IMPRESSION: No evidence of acute chest disease. Electronically Signed   By: Francis Quam M.D.   On: 11/17/2023 03:00   ECHOCARDIOGRAM COMPLETE Result Date: 11/10/2023    ECHOCARDIOGRAM REPORT   Patient Name:   [facility] Date of Exam: 11/10/2023 Medical Rec #:  [id number]              Height:       68.0 in Accession #:    [id number]             Weight:       122.7 lb Date of Birth:  October 11, 1970               BSA:          1.661 m Patient Age:    53 years               BP:           203/112 mmHg Patient Gender: M                      HR:           76 bpm. Exam Location:  Inpatient Procedure: 2D Echo (Both Spectral and Color Flow Doppler were utilized during            procedure). Indications:    syncope  History:        Patient has prior history of Echocardiogram examinations, most                 recent 02/16/2022. Arrythmias:WPW; Risk Factors:Hypertension,                 Current Smoker and polysubstance abuse.  Sonographer:    Tinnie Barefoot RDCS Referring Phys: [id number] MARSA NOVAK MELVIN IMPRESSIONS  1. Left ventricular ejection fraction, by estimation, is 70 to 75%. The left ventricle has hyperdynamic function. The left ventricle has no regional wall motion abnormalities. Left ventricular diastolic parameters are consistent with Grade I diastolic dysfunction (impaired relaxation).  2. Right ventricular systolic function is normal. The  right ventricular size is normal. There is mildly  elevated pulmonary artery systolic pressure. The estimated right ventricular systolic pressure is 44.0 mmHg.  3. The mitral valve is normal in structure. No evidence of mitral valve regurgitation. No evidence of mitral stenosis.  4. The aortic valve has an indeterminant number of cusps. Aortic valve regurgitation is not visualized. No aortic stenosis is present.  5. The inferior vena cava is normal in size with <50% respiratory variability, suggesting right atrial pressure of 8 mmHg. Comparison(s): No significant change from prior study. FINDINGS  Left Ventricle: Left ventricular ejection fraction, by estimation, is 70 to 75%. The left ventricle has hyperdynamic function. The left ventricle has no regional wall motion abnormalities. Strain was performed and the global longitudinal strain is indeterminate. The left ventricular internal cavity size was normal in size. There is no left ventricular hypertrophy. Left ventricular diastolic parameters are consistent with Grade I diastolic dysfunction (impaired relaxation). Right Ventricle: The right ventricular size is normal. No increase in right ventricular wall thickness. Right ventricular systolic function is normal. There is mildly elevated pulmonary artery systolic pressure. The tricuspid regurgitant velocity is 3.00  m/s, and with an assumed right atrial pressure of 8 mmHg, the estimated right ventricular systolic pressure is 44.0 mmHg. Left Atrium: Left atrial size was normal in size. Right Atrium: Right atrial size was normal in size. Pericardium: There is no evidence of pericardial effusion. Mitral Valve: The mitral valve is normal in structure. No evidence of mitral valve regurgitation. No evidence of mitral valve stenosis. Tricuspid Valve: The tricuspid valve is normal in structure. Tricuspid valve regurgitation is trivial. No evidence of tricuspid stenosis. Aortic Valve: The aortic valve has an indeterminant  number of cusps. Aortic valve regurgitation is not visualized. No aortic stenosis is present. Pulmonic Valve: The pulmonic valve was not well visualized. Pulmonic valve regurgitation is not visualized. No evidence of pulmonic stenosis. Aorta: The aortic root and ascending aorta are structurally normal, with no evidence of dilitation. Venous: The inferior vena cava is normal in size with less than 50% respiratory variability, suggesting right atrial pressure of 8 mmHg. IAS/Shunts: No atrial level shunt detected by color flow Doppler. Additional Comments: 3D was performed not requiring image post processing on an independent workstation and was indeterminate.  LEFT VENTRICLE PLAX 2D LVIDd:         4.40 cm   Diastology LVIDs:         2.60 cm   LV e' medial:    11.00 cm/s LV PW:         0.80 cm   LV E/e' medial:  11.1 LV IVS:        0.60 cm   LV e' lateral:   12.60 cm/s LVOT diam:     1.90 cm   LV E/e' lateral: 9.7 LV SV:         58 LV SV Index:   35 LVOT Area:     2.84 cm  RIGHT VENTRICLE             IVC RV Basal diam:  2.50 cm     IVC diam: 2.00 cm RV S prime:     14.80 cm/s TAPSE (M-mode): 1.8 cm LEFT ATRIUM             Index        RIGHT ATRIUM          Index LA diam:        3.00 cm 1.81 cm/m   RA Area:     9.63 cm LA  Vol [facility]):   37.3 ml 22.46 ml/m  RA Volume:   19.90 ml 11.98 ml/m LA Vol (A4C):   31.1 ml 18.73 ml/m LA Biplane Vol: 34.4 ml 20.72 ml/m  AORTIC VALVE LVOT Vmax:   117.00 cm/s LVOT Vmean:  77.300 cm/s LVOT VTI:    0.206 m  AORTA Ao Root diam: 2.70 cm Ao Asc diam:  2.60 cm MITRAL VALVE                TRICUSPID VALVE MV Area (PHT): 5.02 cm     TR Peak grad:   36.0 mmHg MV Decel Time: 151 msec     TR Vmax:        300.00 cm/s MV E velocity: 122.00 cm/s MV A velocity: 96.30 cm/s   SHUNTS MV E/A ratio:  1.27         Systemic VTI:  0.21 m                             Systemic Diam: 1.90 cm Vishnu Priya Mallipeddi Electronically signed by Diannah Late Mallipeddi Signature Date/Time: 11/10/2023/4:55:23 PM     Final    CT Head Wo Contrast Result Date: 11/10/2023 CLINICAL DATA:  Ataxia.  Head trauma. EXAM: CT HEAD WITHOUT CONTRAST TECHNIQUE: Contiguous axial images were obtained from the base of the skull through the vertex without intravenous contrast. RADIATION DOSE REDUCTION: This exam was performed according to the departmental dose-optimization program which includes automated exposure control, adjustment of the mA and/or kV according to patient size and/or use of iterative reconstruction technique. COMPARISON:  CT 10/19/2023 FINDINGS: Brain: No acute intracranial hemorrhage. No focal mass lesion. No CT evidence of acute infarction. No midline shift or mass effect. No hydrocephalus. Basilar cisterns are patent. Vascular: No hyperdense vessel or unexpected calcification. Skull: Normal. Negative for fracture or focal lesion. Sinuses/Orbits: Paranasal sinuses and mastoid air cells are clear. Orbits are clear. Postsurgical hardware in the medial LEFT orbit. Other: None. IMPRESSION: 1. No acute intracranial findings. 2. Postsurgical changes in the LEFT orbit. Electronically Signed   By: Jackquline Boxer M.D.   On: 11/10/2023 11:57   DG Wrist Complete Left Result Date: 11/10/2023 CLINICAL DATA:  Unwitnessed fall.  Left wrist pain and deformity. EXAM: LEFT WRIST - COMPLETE 3+ VIEW COMPARISON:  None Available. FINDINGS: Three views are submitted. The mineralization is adequate. There is a nondisplaced ulnar styloid fracture. Probable nondisplaced fracture of the distal radius with mild dorsal cortical irregularity. No evidence of dislocation or carpal bone fracture. IV tubing in place. Moderate soft tissue swelling around the wrist without evidence of unexpected foreign body or soft tissue emphysema. IMPRESSION: Nondisplaced ulnar styloid fracture. Probable nondisplaced distal radius fracture. No dislocation. Electronically Signed   By: Elsie Perone M.D.   On: 11/10/2023 11:43   US  Abdomen Limited RUQ  (LIVER/GB) Result Date: 11/07/2023 CLINICAL DATA:  Right upper quadrant pain EXAM: ULTRASOUND ABDOMEN LIMITED RIGHT UPPER QUADRANT COMPARISON:  None Available. FINDINGS: Gallbladder: No gallstones or wall thickening visualized. No sonographic Murphy sign noted by sonographer. Common bile duct: Diameter: 5 mm Liver: No focal lesion identified. Within normal limits in parenchymal echogenicity. Portal vein is patent on color Doppler imaging with normal direction of blood flow towards the liver. Other: None. IMPRESSION: No gallstones or ductal dilatation. Electronically Signed   By: Ranell Bring M.D.   On: 11/07/2023 10:12   CT ABDOMEN PELVIS WO CONTRAST Result Date: 11/07/2023 CLINICAL DATA:  Left  lower quadrant abdominal pain, chronic pancreatitis, diarrhea EXAM: CT ABDOMEN AND PELVIS WITHOUT CONTRAST TECHNIQUE: Multidetector CT imaging of the abdomen and pelvis was performed following the standard protocol without IV contrast. RADIATION DOSE REDUCTION: This exam was performed according to the departmental dose-optimization program which includes automated exposure control, adjustment of the mA and/or kV according to patient size and/or use of iterative reconstruction technique. COMPARISON:  CT abdomen pelvis 10/22/2023 FINDINGS: Lower chest: No acute abnormality. Hepatobiliary: No focal liver abnormality is seen. No gallstones, gallbladder wall thickening, or biliary dilatation. Pancreas: Coarse calcifications in the pancreatic head and body compatible with sequela of chronic pancreatitis. 4.0 cm and 1.2 cm cysts in the pancreatic talar similar to prior and compatible with pseudocysts. No evidence of acute pancreatic inflammation. No ductal dilation. Spleen: Unremarkable. Adrenals/Urinary Tract: Unremarkable adrenal glands. No urinary calculi or hydronephrosis. Unremarkable bladder. Stomach/Bowel: Normal caliber large and small bowel. No bowel wall thickening. Stomach is unremarkable. Vascular/Lymphatic: Aortic  atherosclerosis. No enlarged abdominal or pelvic lymph nodes. Unremarkable. Reproductive: Air. Other: No free intraperitoneal fluid or Musculoskeletal: No acute fracture. IMPRESSION: 1. No acute abnormality in the abdomen or pelvis. 2. 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 No evidence of acute pancreatic inflammation. 3. Aortic Atherosclerosis (ICD10-I70.0). Electronically Signed   By: Norman Gatlin M.D.   On: 11/07/2023 01:54   DG Chest 2 View Result Date: 11/06/2023 CLINICAL DATA:  Chest pain EXAM: CHEST - 2 VIEW COMPARISON:  10/21/2023 FINDINGS: The heart size and mediastinal contours are within normal limits. Both lungs are clear. The visualized skeletal structures are unremarkable. IMPRESSION: No active cardiopulmonary disease. Electronically Signed   By: Oneil Devonshire M.D.   On: 11/06/2023 23:39       The results of significant diagnostics from this hospitalization (including imaging, microbiology, ancillary and laboratory) are listed below for reference.     Microbiology: Recent Results (from the past 240 hours)  MRSA Next Gen by PCR, Nasal     Status: None   Collection Time: 12/03/23  5:21 PM   Specimen: Nasal Mucosa; Nasal Swab  Result Value Ref Range Status   MRSA by PCR Next Gen NOT DETECTED NOT DETECTED Final    Comment: (NOTE) The GeneXpert MRSA Assay (FDA approved for NASAL specimens only), is one component of a comprehensive MRSA colonization surveillance program. It is not intended to diagnose MRSA infection nor to guide or monitor treatment for MRSA infections. Test performance is not FDA approved in patients less than 63 years old. Performed at [facility], [address], [place], [place] [address]      Labs:  CBC: Recent Labs  Lab 11/28/23 1622 12/02/23 0535 12/03/23 0816 12/04/23 0915 12/05/23 0339  WBC 5.3 8.4 9.1 7.5 8.4  HGB 11.5* 10.5* 10.9* 11.3* 10.3*  HCT 37.2* 32.0* 33.7* 35.4* 31.8*  MCV 96.9 91.7 91.6  93.7 92.4  PLT 185 207 203 198 168   BMP &GFR Recent Labs  Lab 11/28/23 1622 12/02/23 0535 12/03/23 0816 12/04/23 0915 12/05/23 0339  NA 142 137 139 135 138  K 4.1 3.7 3.4* 4.5 4.0  CL 109 110 105 107 107  CO2 17* 19* 19* 18* 22  GLUCOSE 87 103* 173* 170* 109*  BUN 7 12 10  5* <5*  CREATININE 1.13 0.76 1.04 0.73 0.67  CALCIUM  7.7* 8.2* 8.4* 8.9 8.8*  MG  --   --  1.1* 1.3* 1.3*   Estimated Creatinine Clearance: 81.4 mL/min (by C-G formula based on SCr  of 0.67 mg/dL). Liver & Pancreas: Recent Labs  Lab 11/28/23 1622 12/02/23 0535 12/03/23 0816 12/04/23 0915 12/05/23 0339  AST 47* 51* 47* 45* 27  ALT 25 28 26 28 24   ALKPHOS 67 66 62 73 59  BILITOT 0.7 0.8 0.6 0.5 0.4  PROT 7.2 6.9 6.7 7.3 6.1*  ALBUMIN  3.8 3.7 3.4* 3.9 3.2*   Recent Labs  Lab 11/28/23 1622 12/02/23 0535 12/03/23 0816  LIPASE 25 31 33   No results for input(s): AMMONIA in the last 168 hours. Diabetic: No results for input(s): HGBA1C in the last 72 hours. No results for input(s): GLUCAP in the last 168 hours. Cardiac Enzymes: No results for input(s): CKTOTAL, CKMB, CKMBINDEX, TROPONINI in the last 168 hours. No results for input(s): PROBNP in the last 8760 hours. Coagulation Profile: No results for input(s): INR, PROTIME in the last 168 hours. Thyroid  Function Tests: No results for input(s): TSH, T4TOTAL, FREET4, T3FREE, THYROIDAB in the last 72 hours. Lipid Profile: No results for input(s): CHOL, HDL, LDLCALC, TRIG, CHOLHDL, LDLDIRECT in the last 72 hours. Anemia Panel: No results for input(s): VITAMINB12, FOLATE, FERRITIN, TIBC, IRON, RETICCTPCT in the last 72 hours. Urine analysis:    Component Value Date/Time   COLORURINE COLORLESS (A) 11/06/2023 2346   APPEARANCEUR CLEAR 11/06/2023 2346   LABSPEC 1.002 (L) 11/06/2023 2346   PHURINE 6.0 11/06/2023 2346   GLUCOSEU NEGATIVE 11/06/2023 2346   HGBUR NEGATIVE 11/06/2023 2346   HGBUR  negative 04/30/2010 1020   BILIRUBINUR NEGATIVE 11/06/2023 2346   BILIRUBINUR negative 09/06/2019 1649   KETONESUR NEGATIVE 11/06/2023 2346   PROTEINUR NEGATIVE 11/06/2023 2346   UROBILINOGEN 0.2 09/06/2019 1649   UROBILINOGEN 0.2 03/15/2015 0508   NITRITE NEGATIVE 11/06/2023 2346   LEUKOCYTESUR NEGATIVE 11/06/2023 2346   Sepsis Labs: Invalid input(s): PROCALCITONIN, LACTICIDVEN   SIGNED:  Jasiyah Poland T Calob Baskette, MD  [facility] 12/05/2023, 9:14 AM

## 2023-12-05 NOTE — Telephone Encounter (Signed)
 Copied from CRM [id number]. Topic: General - Other >> Dec 05, 2023  9:50 AM [name] wrote: Reason for CRM: Patient currently admitted to the hospital. Patient states his nurse needs to speak to his PCP.   Patient states he wanted to make hospital aware that [facility] does not prescribe narcotics. Patient requesting PCP to reach out to provider in hospital, Mignon Bump, MD.   Patient states Dr. Bump informed patient that his PCP should be able to prescribe the oxycodone  for him.

## 2023-12-05 NOTE — Telephone Encounter (Signed)
 While speaking with the pt the pt stated he is needing a referral to pain management but he is not able to go to [facility]. Pt is also requesting a referral to see someone in regards to his Pancreatitis. Pt is also requesting a refill on Carafate   Will forward to provider

## 2023-12-05 NOTE — Plan of Care (Signed)
  Problem: Education: Goal: Knowledge of General Education information will improve Description: Including pain rating scale, medication(s)/side effects and non-pharmacologic comfort measures Outcome: Progressing   Problem: Health Behavior/Discharge Planning: Goal: Ability to manage health-related needs will improve Outcome: Progressing   Problem: Pain Managment: Goal: General experience of comfort will improve and/or be controlled Outcome: Progressing   Problem: Safety: Goal: Ability to remain free from injury will improve Outcome: Progressing   Problem: Skin Integrity: Goal: Risk for impaired skin integrity will decrease Outcome: Progressing

## 2023-12-05 NOTE — Telephone Encounter (Signed)
 Patient presented stating the hospital had referred him for an immediate appointment to obtain pain medication. Both the CMA and I informed the patient that this provider does not prescribe narcotics. The patient insisted the hospital had advised him that he could obtain them here.  We reiterated our policy regarding controlled substances. At that point, the patient became visibly upset and angry. Due to the presence of other patients in the lobby, I attempted to de-escalate the situation to maintain a calm environment.  The patient stated he would return and asked his mother to come with him. I followed them to the door to make sure they were walking out of the door.  My goal was to de-escalate the situation and just give the care the patient needed.  With support from the CMA, we were able to calm the patient and engage in a constructive conversation.  The patient became emotional and apologized for his initial reaction, expressing that he now understood our approach and appreciated the support. He agreed to work with us  on a plan to better help with what issues are at hand.

## 2023-12-05 NOTE — Telephone Encounter (Unsigned)
 Copied from CRM [id number] [id number]. Topic: General - Other >> Dec 05, 2023  9:50 AM Tobias CROME wrote: Reason for CRM: Patient currently admitted to the hospital. Patient states his nurse needs to speak to his PCP.   Patient states he wanted to make hospital aware that [facility] does not prescribe narcotics. Patient requesting PCP to reach out to provider in hospital, Mignon Bump, MD.   Patient states Dr. Bump informed patient that his PCP should be able to prescribe the oxycodone  for him.

## 2023-12-05 NOTE — Progress Notes (Signed)
 err

## 2023-12-05 NOTE — Evaluation (Signed)
 Occupational Therapy Evaluation Patient Details Name: [name] MRN: [id number] DOB: June 10, 1970 Today's Date: 12/05/2023   History of Present Illness   53 year old M presenting 7/27 with chest tightness, vomiting, abdominal pain and found to be in A-fib with RVR with HR at 150s. PMH of EtOH abuse, paroxysmal A-fib, WPW s/p ablation?, chronic pancreatitis, polysubstance use (cocaine, alcohol  and tobacco), recent left arm fracture and bipolar disorder.     Clinical Impressions PTA, pt lived with mother and reports being independent in ADL. Upon eval, pt performing UB ADL with set-up and LB ADL with supervision. Intro cane for balance per PT recommendation in note yesterday and pt performing functional ambulation CGA-close supervision. Recommending discharge home with HHOT.      If plan is discharge home, recommend the following:   A little help with walking and/or transfers;A little help with bathing/dressing/bathroom;Assistance with cooking/housework;Help with stairs or ramp for entrance;Assist for transportation     Functional Status Assessment   Patient has had a recent decline in their functional status and demonstrates the ability to make significant improvements in function in a reasonable and predictable amount of time.     Equipment Recommendations   None recommended by OT     Recommendations for Other Services         Precautions/Restrictions   Precautions Precautions: Fall;Other (comment) Recall of Precautions/Restrictions: Impaired Precaution/Restrictions Comments: impulsive, syncopal episodes - WPW syndrome, monitor HR Restrictions Weight Bearing Restrictions Per Provider Order: No     Mobility Bed Mobility Overal bed mobility: Modified Independent                  Transfers Overall transfer level: Needs assistance Equipment used: None Transfers: Sit to/from Stand Sit to Stand: Supervision           General transfer  comment: denies dizziness with VSS      Balance Overall balance assessment: Mild deficits observed, not formally tested                                         ADL either performed or assessed with clinical judgement   ADL Overall ADL's : Needs assistance/impaired Eating/Feeding: Independent   Grooming: Supervision/safety;Standing   Upper Body Bathing: Set up;Sitting   Lower Body Bathing: Supervison/ safety;Sit to/from stand   Upper Body Dressing : Set up;Sitting   Lower Body Dressing: Supervision/safety;Sit to/from stand Lower Body Dressing Details (indicate cue type and reason): mildloy unsteady on stand but no LOB Toilet Transfer: Contact guard assist;Ambulation (cane)           Functional mobility during ADLs: Contact guard assist;Cane       Vision Patient Visual Report: No change from baseline       Perception         Praxis         Pertinent Vitals/Pain Pain Assessment Pain Assessment: No/denies pain     Extremity/Trunk Assessment Upper Extremity Assessment Upper Extremity Assessment: Generalized weakness   Lower Extremity Assessment Lower Extremity Assessment: Defer to PT evaluation       Communication Communication Communication: No apparent difficulties   Cognition Arousal: Alert Behavior During Therapy: WFL for tasks assessed/performed Cognition: No family/caregiver present to determine baseline, Cognition impaired   Orientation impairments: Time (day; also poor recall of events since admission) Awareness: Intellectual awareness intact Memory impairment (select all impairments): Short-term memory, Declarative long-term memory (difficulty recalling events from  yesterday) Attention impairment (select first level of impairment): Sustained attention, Selective attention Executive functioning impairment (select all impairments): Problem solving                   Following commands: Intact       Cueing  General  Comments   Cueing Techniques: Verbal cues      Exercises     Shoulder Instructions      Home Living Family/patient expects to be discharged to:: Private residence Living Arrangements: Parent Available Help at Discharge: Family;Available PRN/intermittently Type of Home: House Home Access: Stairs to enter [employer] of Steps: 8 Entrance Stairs-Rails: Right Home Layout: Multi-level;Bed/bath upstairs Alternate Level Stairs-Number of Steps: 8 Alternate Level Stairs-Rails: Right;Left Bathroom Shower/Tub: Producer, television/film/video: Standard     Home Equipment: None   Additional Comments: per PT note, this info is Conflicting information from prior hx taken      Prior Functioning/Environment Prior Level of Function : Independent/Modified Independent;History of Falls (last six months)             Mobility Comments: Reports ind but syncopal episodes ADLs Comments: ind    OT Problem List: Decreased strength;Decreased activity tolerance;Impaired balance (sitting and/or standing);Decreased safety awareness;Decreased cognition   OT Treatment/Interventions: Self-care/ADL training;Therapeutic exercise;DME and/or AE instruction;Therapeutic activities;Balance training;Patient/family education;Cognitive remediation/compensation      OT Goals(Current goals can be found in the care plan section)   Acute Rehab OT Goals Patient Stated Goal: get better OT Goal Formulation: With patient Time For Goal Achievement: 12/19/23 Potential to Achieve Goals: Good   OT Frequency:  Min 2X/week    Co-evaluation              AM-PAC OT 6 Clicks Daily Activity     Outcome Measure Help from another person eating meals?: None Help from another person taking care of personal grooming?: A Little Help from another person toileting, which includes using toliet, bedpan, or urinal?: A Little Help from another person bathing (including washing, rinsing, drying)?: A  Little Help from another person to put on and taking off regular upper body clothing?: A Little Help from another person to put on and taking off regular lower body clothing?: A Little 6 Click Score: 19   End of Session Equipment Utilized During Treatment: Gait belt;Other (comment) (straight cane) Nurse Communication: Mobility status  Activity Tolerance: Patient tolerated treatment well Patient left: in chair;with chair alarm set;with call bell/phone within reach  OT Visit Diagnosis: Unsteadiness on feet (R26.81);Muscle weakness (generalized) (M62.81);Other symptoms and signs involving cognitive function                Time: 9253-9188 OT Time Calculation (min): 25 min Charges:  OT General Charges $OT Visit: 1 Visit OT Evaluation $OT Eval Low Complexity: 1 Low OT Treatments $Self Care/Home Management : 8-22 mins  Elma JONETTA Lebron FREDERICK, OTR/L [facility] Acute Rehabilitation Office: [phone]   Elma JONETTA Lebron 12/05/2023, 11:53 AM

## 2023-12-05 NOTE — Telephone Encounter (Signed)
 While speaking with the pt the pt stated he is needing a referral to pain management but he is not able to go to [facility]. Pt is also requesting a referral to see someone in regards to his Pancreatitis. Pt is also requesting a refill on Carafate 

## 2023-12-06 ENCOUNTER — Telehealth (INDEPENDENT_AMBULATORY_CARE_PROVIDER_SITE_OTHER): Payer: Self-pay

## 2023-12-06 NOTE — Progress Notes (Deleted)
 Cardiology Office Note    Date:  12/06/2023  ID:  Quashon, Jesus 04-19-1971, MRN [id number] PCP:  Celestia Rosaline SQUIBB, NP  Cardiologist:  Lynwood Schilling, MD  Electrophysiologist:  None   Chief Complaint: ***  History of Present Illness: .    Axel Dong Nimmons is a 53 y.o. male with visit-pertinent history of WPW s/p ablation, substance use, alcohol  abuse and alcoholic pancreatitis.  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  Per patient's prior reports he underwent WPW ablation in 2014/2015 although unclear as to where.  Nuclear stress test on 01/12/2022 findings consistent with no prior ischemia, study was considered intermediate risk as LVEF function was abnormal, EF 47%.  Follow-up limited echo on 02/16/2022 indicated LVEF 65 to 70%.  No RWMA, mild LVH.  Per notes patient also has history of paroxysmal atrial fibrillation however has not been started anticoagulation due to low CHA2DS2-VASc and ongoing alcohol  use.  On 11/10/2023 cardiology visual insulted after patient presented to the ED.  Per notes patient was walking in his house when he suddenly collapsed and passed out, denied any warning signs of dizziness or palpitations, unknown duration of syncope.  After he woke up he called 9 1 and was told to call again again if he had any issues.  Patient started having chest pain, stent pain and dizziness, called 911 again and was brought to the ER.  EKG showed short PR interval, sinus tachycardia, HR 100 bpm, no ischemia, hide since troponins were normal.  BP upon arrival was around 200 mmHg however patient reported readings between 120-130 mmHg at home.  It was questioned if patient carry diagnosis of WPW, noted that there was no record of an ablation completed at [facility], recommended live Zio patch for 2 weeks on discharge, patient was transition from metoprolol  to carvedilol  given  history of cocaine use.  Echocardiogram on 11/10/2023 indicated LVEF of 70 to 75%, no RWMA, G1 DD, RV systolic function and size was normal, mildly elevated pulmonary artery systolic pressures, no significant valvular abnormalities.  Patient was discharged on 11/13/2023 with cardiac monitor.  Patient was seen in the ED on 7/11 for evaluation of chest pain, epigastric abdominal discomfort, nausea.  High sensitive troponin negative x 2.  Lipase normal.  He was started on sucralfate  and discharged in the ED.  Patient again admitted 7/15 through 7/17 after he presented with chest pain and abdominal pain.  Patient also reported diarrhea and vomiting, patient reported feeling as though he had recurrent pancreatitis.  At bedtime TN negative.  Patient had relief with GI cocktail.  Patient seen in the ED on 7/22 with abdominal pain, felt like his pancreatitis pain.  EKG nonischemic.  High sensitive troponin negative x 2.  Suspected that he had an exacerbation of his chronic pancreatitis/chronic pain.  He was given 10 tablets of Percocet, per notes he has previously been discharged from [facility] medical pain management due to positive drug screens.  Patient seen in the ED on 7/26 with right scapular pain.  EKG was without acute ischemia.  Troponin negative.  Presented to the ED 7/27 after he had an episode of vomiting and developed chest pain after vomiting.  EMS reported that they found him in A-fib with RVR, heart rate in the 190s to 220s.  Was given amiodarone  with improvement in heart rate.  In the ED initial vital signs showed heart rate 155 bpm,  oxygen  100% on room air, BP 121/91.  Initial EKG showed atrial fibrillation with heart rate 157 bpm.  Labs significant for K3.4, mag 1.1, hemoglobin 10.9.  Hstn 6>27. CXR showed active disease.  Patient was given IV procainamide , IV magnesium .  He converted to normal sinus rhythm and cardiology was consulted.  Patient reported that he had been feeling nauseous the prior  day, he tried to eat something and his nausea worsened then started to vomit.  He developed palpitations and a squeezing feeling in his chest.  Patient was afraid he would pass out if he did not call EMS.  He converted to normal sinus rhythm while in the ED.  Following that he had one-two episodes of palpitations overnight.  Patient continued to have some dizziness when he was moving his head or standing up too quickly.  Patient continued to endorse the squeezing feeling in his chest, was reproducible on palpation, patient also reported that it worsened if he ate or would take deep breaths.    Paroxysmal atrial fibrillation/?WPW: Patient has a reported history of WPW and reports undergoing ablation in 2014.  Recently this diagnosis has been questioned, there is no records of ablation available in chart.  EKGs over the years have shown short PR interval, no delta waves. Patient recently admitted with chest pain, vomiting, found to be in A-fib with RVR, K3.4 mag 1.1, given IV procainamide  and IV magnesium  and converted to normal sinus rhythm.  It was felt that A-fib was driven by low magnesium  and potassium in setting of vomiting, diarrhea and ongoing alcohol  use. Patient has not been started on [facility] due to low CHA2DS2-VASc as well as ongoing alcohol  abuse. ZIO monitor indicated  Chest pain: At last admission patient reported a squeezing feeling in his chest, worsens with deep breaths and eating, reproducible on palpation.  High sensitive troponin 6>27>20.  Mild elevation in flat trend, not consistent with ACS.  Suspected to be from demand ischemia from A-fib with RVR.  CTA chest on 11/21/2023 showed no coronary artery calcifications, echo from 7/4 showed EF 7075%, no wall motion abnormalities Patient previously noted to not to be a good candidate for cath/ischemic evaluation given ongoing alcohol  and cocaine use.  No further cardiac workup planned at this time.  Hypertension: Blood pressure today     Labwork independently reviewed:   ROS: .   *** denies chest pain, shortness of breath, lower extremity edema, fatigue, palpitations, melena, hematuria, hemoptysis, diaphoresis, weakness, presyncope, syncope, orthopnea, and PND.  All other systems are reviewed and otherwise negative.  Studies Reviewed: SABRA    EKG:  EKG is ordered today, personally reviewed, demonstrating ***     CV Studies: Cardiac studies reviewed are outlined and summarized above. Otherwise please see EMR for full report. Cardiac Studies & Procedures   ______________________________________________________________________________________________   STRESS TESTS  MYOCARDIAL PERFUSION IMAGING 01/12/2022  Interpretation Summary   Findings are consistent with no prior ischemia. The study is intermediate risk.   No ST deviation was noted.   LV perfusion is normal.   Left ventricular function is abnormal. Global function is mildly reduced. Nuclear stress EF: 47 %. The left ventricular ejection fraction is mildly decreased (45-54%). End diastolic cavity size is normal. End systolic cavity size is normal.   Prior study available for comparison from 10/03/2012. There are changes compared to prior study. The left ventricular ejection fraction has decreased. No ischemia. LVEF 67%  No reversible ischemia. LVEF 47% with global hypokinesis. This is an intermediate risk  study. Compared to a prior study in 2014, the LVEF has decreased.   ECHOCARDIOGRAM  ECHOCARDIOGRAM COMPLETE 11/10/2023  Narrative ECHOCARDIOGRAM REPORT    Patient Name:   [facility] Date of Exam: 11/10/2023 Medical Rec #:  [id number]              Height:       68.0 in Accession #:    [id number]             Weight:       122.7 lb Date of Birth:  Jul 31, 1970               BSA:          1.661 m Patient Age:    53 years               BP:           203/112 mmHg Patient Gender: M                      HR:           76 bpm. Exam Location:   Inpatient  Procedure: 2D Echo (Both Spectral and Color Flow Doppler were utilized during procedure).  Indications:    syncope  History:        Patient has prior history of Echocardiogram examinations, most recent 02/16/2022. Arrythmias:WPW; Risk Factors:Hypertension, Current Smoker and polysubstance abuse.  Sonographer:    Tinnie Barefoot RDCS Referring Phys: [id number] MARSA NOVAK MELVIN  IMPRESSIONS   1. Left ventricular ejection fraction, by estimation, is 70 to 75%. The left ventricle has hyperdynamic function. The left ventricle has no regional wall motion abnormalities. Left ventricular diastolic parameters are consistent with Grade I diastolic dysfunction (impaired relaxation). 2. Right ventricular systolic function is normal. The right ventricular size is normal. There is mildly elevated pulmonary artery systolic pressure. The estimated right ventricular systolic pressure is 44.0 mmHg. 3. The mitral valve is normal in structure. No evidence of mitral valve regurgitation. No evidence of mitral stenosis. 4. The aortic valve has an indeterminant number of cusps. Aortic valve regurgitation is not visualized. No aortic stenosis is present. 5. The inferior vena cava is normal in size with <50% respiratory variability, suggesting right atrial pressure of 8 mmHg.  Comparison(s): No significant change from prior study.  FINDINGS Left Ventricle: Left ventricular ejection fraction, by estimation, is 70 to 75%. The left ventricle has hyperdynamic function. The left ventricle has no regional wall motion abnormalities. Strain was performed and the global longitudinal strain is indeterminate. The left ventricular internal cavity size was normal in size. There is no left ventricular hypertrophy. Left ventricular diastolic parameters are consistent with Grade I diastolic dysfunction (impaired relaxation).  Right Ventricle: The right ventricular size is normal. No increase in right ventricular  wall thickness. Right ventricular systolic function is normal. There is mildly elevated pulmonary artery systolic pressure. The tricuspid regurgitant velocity is 3.00 m/s, and with an assumed right atrial pressure of 8 mmHg, the estimated right ventricular systolic pressure is 44.0 mmHg.  Left Atrium: Left atrial size was normal in size.  Right Atrium: Right atrial size was normal in size.  Pericardium: There is no evidence of pericardial effusion.  Mitral Valve: The mitral valve is normal in structure. No evidence of mitral valve regurgitation. No evidence of mitral valve stenosis.  Tricuspid Valve: The tricuspid valve is normal in structure. Tricuspid valve regurgitation is trivial. No evidence of tricuspid stenosis.  Aortic Valve: The  aortic valve has an indeterminant number of cusps. Aortic valve regurgitation is not visualized. No aortic stenosis is present.  Pulmonic Valve: The pulmonic valve was not well visualized. Pulmonic valve regurgitation is not visualized. No evidence of pulmonic stenosis.  Aorta: The aortic root and ascending aorta are structurally normal, with no evidence of dilitation.  Venous: The inferior vena cava is normal in size with less than 50% respiratory variability, suggesting right atrial pressure of 8 mmHg.  IAS/Shunts: No atrial level shunt detected by color flow Doppler.  Additional Comments: 3D was performed not requiring image post processing on an independent workstation and was indeterminate.   LEFT VENTRICLE PLAX 2D LVIDd:         4.40 cm   Diastology LVIDs:         2.60 cm   LV e' medial:    11.00 cm/s LV PW:         0.80 cm   LV E/e' medial:  11.1 LV IVS:        0.60 cm   LV e' lateral:   12.60 cm/s LVOT diam:     1.90 cm   LV E/e' lateral: 9.7 LV SV:         58 LV SV Index:   35 LVOT Area:     2.84 cm   RIGHT VENTRICLE             IVC RV Basal diam:  2.50 cm     IVC diam: 2.00 cm RV S prime:     14.80 cm/s TAPSE (M-mode): 1.8  cm  LEFT ATRIUM             Index        RIGHT ATRIUM          Index LA diam:        3.00 cm 1.81 cm/m   RA Area:     9.63 cm LA Vol (A2C):   37.3 ml 22.46 ml/m  RA Volume:   19.90 ml 11.98 ml/m LA Vol (A4C):   31.1 ml 18.73 ml/m LA Biplane Vol: 34.4 ml 20.72 ml/m AORTIC VALVE LVOT Vmax:   117.00 cm/s LVOT Vmean:  77.300 cm/s LVOT VTI:    0.206 m  AORTA Ao Root diam: 2.70 cm Ao Asc diam:  2.60 cm  MITRAL VALVE                TRICUSPID VALVE MV Area (PHT): 5.02 cm     TR Peak grad:   36.0 mmHg MV Decel Time: 151 msec     TR Vmax:        300.00 cm/s MV E velocity: 122.00 cm/s MV A velocity: 96.30 cm/s   SHUNTS MV E/A ratio:  1.27         Systemic VTI:  0.21 m Systemic Diam: 1.90 cm  Vishnu Priya Mallipeddi Electronically signed by Diannah Late Mallipeddi Signature Date/Time: 11/10/2023/4:55:23 PM    Final          ______________________________________________________________________________________________       Current Reported Medications:.    No outpatient medications have been marked as taking for the 12/08/23 encounter (Appointment) with Dax Murguia D, NP.    Physical Exam:    VS:  There were no vitals taken for this visit.   Wt Readings from Last 3 Encounters:  12/03/23 120 lb 1.6 oz (54.5 kg)  11/28/23 125 lb (56.7 kg)  11/22/23 122 lb 11.2 oz (55.7 kg)    GEN: Well nourished,  well developed in no acute distress NECK: No JVD; No carotid bruits CARDIAC: ***RRR, no murmurs, rubs, gallops RESPIRATORY:  Clear to auscultation without rales, wheezing or rhonchi  ABDOMEN: Soft, non-tender, non-distended EXTREMITIES:  No edema; No acute deformity     Asessement and Plan:.     ***     Disposition: F/u with ***  Signed, Kirsi Hugh D Kaylon Laroche, NP

## 2023-12-06 NOTE — Transitions of Care (Post Inpatient/ED Visit) (Unsigned)
   12/06/2023  Name: [name] MRN: [id number] DOB: [birth year]  Today's TOC FU Call Status: Today's TOC FU Call Status:: Unsuccessful Call (1st Attempt) Unsuccessful Call (1st Attempt) Date: 12/06/23  Attempted to reach the patient regarding the most recent Inpatient/ED visit.  Follow Up Plan: Additional outreach attempts will be made to reach the patient to complete the Transitions of Care (Post Inpatient/ED visit) call.   Signature Julian Lemmings, LPN [facility] Nurse Health Advisor Direct Dial [phone]

## 2023-12-07 NOTE — Transitions of Care (Post Inpatient/ED Visit) (Unsigned)
   12/07/2023  Name: Savio Albrecht MRN: [id number] DOB: 1970-10-23  Today's TOC FU Call Status: Today's TOC FU Call Status:: Unsuccessful Call (2nd Attempt) Unsuccessful Call (1st Attempt) Date: 12/06/23 Unsuccessful Call (2nd Attempt) Date: 12/07/23  Attempted to reach the patient regarding the most recent Inpatient/ED visit.  Follow Up Plan: Additional outreach attempts will be made to reach the patient to complete the Transitions of Care (Post Inpatient/ED visit) call.   Signature Julian Lemmings, LPN [facility] Nurse Health Advisor Direct Dial [phone]

## 2023-12-08 ENCOUNTER — Ambulatory Visit: Payer: MEDICAID | Attending: Cardiology | Admitting: Cardiology

## 2023-12-08 ENCOUNTER — Encounter: Payer: Self-pay | Admitting: Cardiology

## 2023-12-08 ENCOUNTER — Ambulatory Visit: Payer: MEDICAID | Admitting: Cardiology

## 2023-12-08 VITALS — BP 118/78 | HR 76 | Ht 68.0 in | Wt 126.7 lb

## 2023-12-08 DIAGNOSIS — R072 Precordial pain: Secondary | ICD-10-CM

## 2023-12-08 DIAGNOSIS — I480 Paroxysmal atrial fibrillation: Secondary | ICD-10-CM | POA: Diagnosis not present

## 2023-12-08 DIAGNOSIS — I10 Essential (primary) hypertension: Secondary | ICD-10-CM

## 2023-12-08 DIAGNOSIS — Z136 Encounter for screening for cardiovascular disorders: Secondary | ICD-10-CM

## 2023-12-08 DIAGNOSIS — R002 Palpitations: Secondary | ICD-10-CM

## 2023-12-08 DIAGNOSIS — I4891 Unspecified atrial fibrillation: Secondary | ICD-10-CM

## 2023-12-08 MED ORDER — CARVEDILOL 25 MG PO TABS: 25.0000 mg | ORAL_TABLET | Freq: Two times a day (BID) | ORAL | 3 refills | Status: DC

## 2023-12-08 NOTE — Transitions of Care (Post Inpatient/ED Visit) (Signed)
   12/08/2023  Name: [name] MRN: [id number] DOB: Dec 24, 1970  Today's TOC FU Call Status: Today's TOC FU Call Status:: Unsuccessful Call (3rd Attempt) Unsuccessful Call (1st Attempt) Date: 12/06/23 Unsuccessful Call (2nd Attempt) Date: 12/07/23 Unsuccessful Call (3rd Attempt) Date: 12/08/23  Attempted to reach the patient regarding the most recent Inpatient/ED visit.  Follow Up Plan: No further outreach attempts will be made at this time. We have been unable to contact the patient.  Signature Julian Lemmings, LPN [facility] Nurse Health Advisor Direct Dial [phone]

## 2023-12-08 NOTE — Progress Notes (Signed)
 Cardiology Office Note    Date:  12/09/2023  ID:  [name] 07/19/1970, MRN [id number] PCP:  Celestia Rosaline SQUIBB, NP  Cardiologist:  Lynwood Schilling, MD  Electrophysiologist:  None   Chief Complaint: Follow-up for atrial fibrillation  History of Present Illness: .    [name] is a 53 y.o. 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  Per patient's prior reports he underwent WPW ablation in 2014/2015 although unclear as to where, patient has previously reported this took place at [facility] however there is no documentation of this procedure.  Nuclear stress test on 01/12/2022 findings consistent with no prior ischemia, study was considered intermediate risk as LVEF function was abnormal, EF 47%.  Follow-up limited echo on 02/16/2022 indicated LVEF 65 to 70%.  No RWMA, mild LVH.  Per notes patient also has history of paroxysmal atrial fibrillation however has not been started anticoagulation due to low CHA2DS2-VASc and ongoing alcohol  use.  On 11/10/2023 cardiology visual insulted after patient presented to the ED.  Per notes patient was walking in his house when he suddenly collapsed and passed out, denied any warning signs of dizziness or palpitations, unknown duration of syncope.  After he woke up he called 9 1 and was told to call again again if he had any issues.  Patient started having chest pain, stent pain and dizziness, called 911 again and was brought to the ER.  EKG showed short PR interval, sinus tachycardia, HR 100 bpm, no ischemia, hide since troponins were normal.  BP upon arrival was around 200 mmHg however patient reported readings between 120-130 mmHg at home.  It was questioned if patient carry diagnosis of WPW, noted that there  was no record of an ablation completed at [facility], recommended live Zio patch for 2 weeks on discharge, patient was transition from metoprolol  to carvedilol  given history of cocaine use.  Echocardiogram on 11/10/2023 indicated LVEF of 70 to 75%, no RWMA, G1 DD, RV systolic function and size was normal, mildly elevated pulmonary artery systolic pressures, no significant valvular abnormalities.  Patient was discharged on 11/13/2023 with cardiac monitor.  Patient was seen in the ED on 7/11 for evaluation of chest pain, epigastric abdominal discomfort, nausea.  High sensitive troponin negative x 2.  Lipase normal.  He was started on sucralfate  and discharged in the ED.  Patient again admitted 7/15 through 7/17 after he presented with chest pain and abdominal pain.  Patient also reported diarrhea and vomiting, patient reported feeling as though he had recurrent pancreatitis.  Hs troponin negative.  Patient had relief with GI cocktail.  Patient seen in the ED on 7/22 with abdominal pain, felt like his pancreatitis pain.  EKG nonischemic.  High sensitive troponin negative x 2.  Suspected that he had an exacerbation of his chronic pancreatitis/chronic pain.  He was given 10 tablets of Percocet, per notes he has previously been discharged from [facility] medical pain management due to positive drug screens.  Patient seen in the ED on 7/26 with right scapular pain.  EKG was without acute ischemia.  Troponin negative.  Presented to the ED 7/27 after he had an episode of vomiting and developed chest pain after vomiting.  EMS reported that they found him in A-fib with RVR, heart rate in the 190s to  220s.  Was given amiodarone  with improvement in heart rate.  In the ED initial vital signs showed heart rate 155 bpm, oxygen  100% on room air, BP 121/91.  Initial EKG showed atrial fibrillation with heart rate 157 bpm.  Labs significant for K3.4, mag 1.1, hemoglobin 10.9.  Hstn 6>27. CXR showed active disease.  Patient was given IV  procainamide , IV magnesium .  He converted to normal sinus rhythm and cardiology was consulted.  Patient reported that he had been feeling nauseous the prior day, he tried to eat something and his nausea worsened then started to vomit.  He developed palpitations and a squeezing feeling in his chest.  Patient was afraid he would pass out if he did not call EMS.  He converted to normal sinus rhythm while in the ED.  Following that he had one-two episodes of palpitations overnight.  Patient continued to have some dizziness when he was moving his head or standing up too quickly.  Patient continued to endorse the squeezing feeling in his chest, was reproducible on palpation, patient also reported that it worsened if he ate or would take deep breaths.  Today he presents for hospital follow-up.  He reports that he is doing okay overall.  He notes that he is feeling somewhat better since his most recent hospitalization.  Patient endorses a left-sided chest discomfort, typically worsens after eating, is reproducible with palpation, not associated with exertion.  He also reports an intermittent fluttering sensation in his chest that only last for a few seconds.  Patient reports that he was supposed to have an ablation a few months ago however canceled the appointment.  He denies any increased lower extremity edema, orthopnea or PND.  Patient request pain medication today at appointment, recommend he follow-up with PCP or with pain management. Patient reports that he has an appointment with [facility] to establish with pain medicine.  ROS: .   Today he denies  lower extremity edema, fatigue, palpitations, melena, hematuria, hemoptysis, diaphoresis, weakness, presyncope, syncope, orthopnea, and PND.  All other systems are reviewed and otherwise negative. Studies Reviewed: SABRA   EKG:  EKG is ordered today, personally reviewed, demonstrating  EKG Interpretation Date/Time:  Friday December 08 2023 11:44:08  EDT Ventricular Rate:  70 PR Interval:  106 QRS Duration:  82 QT Interval:  378 QTC Calculation: 408 R Axis:   73  Text Interpretation: Sinus rhythm with short PR with Premature atrial complexes T wave inversion in lateral leads Confirmed by Cypress Fanfan [phone]) on 12/08/2023 6:18:26 PM   CV Studies: Cardiac studies reviewed are outlined and summarized above. Otherwise please see EMR for full report. Cardiac Studies & Procedures   ______________________________________________________________________________________________   STRESS TESTS  MYOCARDIAL PERFUSION IMAGING 01/12/2022  Interpretation Summary   Findings are consistent with no prior ischemia. The study is intermediate risk.   No ST deviation was noted.   LV perfusion is normal.   Left ventricular function is abnormal. Global function is mildly reduced. Nuclear stress EF: 47 %. The left ventricular ejection fraction is mildly decreased (45-54%). End diastolic cavity size is normal. End systolic cavity size is normal.   Prior study available for comparison from 10/03/2012. There are changes compared to prior study. The left ventricular ejection fraction has decreased. No ischemia. LVEF 67%  No reversible ischemia. LVEF 47% with global hypokinesis. This is an intermediate risk study. Compared to a prior study in 2014, the LVEF has decreased.   ECHOCARDIOGRAM  ECHOCARDIOGRAM COMPLETE 11/10/2023  Narrative ECHOCARDIOGRAM REPORT  Patient Name:   [facility] Date of Exam: 11/10/2023 Medical Rec #:  [id number]              Height:       68.0 in Accession #:    [id number]             Weight:       122.7 lb Date of Birth:  1970/06/17               BSA:          1.661 m Patient Age:    53 years               BP:           203/112 mmHg Patient Gender: M                      HR:           76 bpm. Exam Location:  Inpatient  Procedure: 2D Echo (Both Spectral and Color Flow Doppler were utilized  during procedure).  Indications:    syncope  History:        Patient has prior history of Echocardiogram examinations, most recent 02/16/2022. Arrythmias:WPW; Risk Factors:Hypertension, Current Smoker and polysubstance abuse.  Sonographer:    Tinnie Barefoot RDCS Referring Phys: [id number] MARSA NOVAK MELVIN  IMPRESSIONS   1. Left ventricular ejection fraction, by estimation, is 70 to 75%. The left ventricle has hyperdynamic function. The left ventricle has no regional wall motion abnormalities. Left ventricular diastolic parameters are consistent with Grade I diastolic dysfunction (impaired relaxation). 2. Right ventricular systolic function is normal. The right ventricular size is normal. There is mildly elevated pulmonary artery systolic pressure. The estimated right ventricular systolic pressure is 44.0 mmHg. 3. The mitral valve is normal in structure. No evidence of mitral valve regurgitation. No evidence of mitral stenosis. 4. The aortic valve has an indeterminant number of cusps. Aortic valve regurgitation is not visualized. No aortic stenosis is present. 5. The inferior vena cava is normal in size with <50% respiratory variability, suggesting right atrial pressure of 8 mmHg.  Comparison(s): No significant change from prior study.  FINDINGS Left Ventricle: Left ventricular ejection fraction, by estimation, is 70 to 75%. The left ventricle has hyperdynamic function. The left ventricle has no regional wall motion abnormalities. Strain was performed and the global longitudinal strain is indeterminate. The left ventricular internal cavity size was normal in size. There is no left ventricular hypertrophy. Left ventricular diastolic parameters are consistent with Grade I diastolic dysfunction (impaired relaxation).  Right Ventricle: The right ventricular size is normal. No increase in right ventricular wall thickness. Right ventricular systolic function is normal. There is mildly elevated  pulmonary artery systolic pressure. The tricuspid regurgitant velocity is 3.00 m/s, and with an assumed right atrial pressure of 8 mmHg, the estimated right ventricular systolic pressure is 44.0 mmHg.  Left Atrium: Left atrial size was normal in size.  Right Atrium: Right atrial size was normal in size.  Pericardium: There is no evidence of pericardial effusion.  Mitral Valve: The mitral valve is normal in structure. No evidence of mitral valve regurgitation. No evidence of mitral valve stenosis.  Tricuspid Valve: The tricuspid valve is normal in structure. Tricuspid valve regurgitation is trivial. No evidence of tricuspid stenosis.  Aortic Valve: The aortic valve has an indeterminant number of cusps. Aortic valve regurgitation is not visualized. No aortic stenosis is present.  Pulmonic Valve: The pulmonic valve was  not well visualized. Pulmonic valve regurgitation is not visualized. No evidence of pulmonic stenosis.  Aorta: The aortic root and ascending aorta are structurally normal, with no evidence of dilitation.  Venous: The inferior vena cava is normal in size with less than 50% respiratory variability, suggesting right atrial pressure of 8 mmHg.  IAS/Shunts: No atrial level shunt detected by color flow Doppler.  Additional Comments: 3D was performed not requiring image post processing on an independent workstation and was indeterminate.   LEFT VENTRICLE PLAX 2D LVIDd:         4.40 cm   Diastology LVIDs:         2.60 cm   LV e' medial:    11.00 cm/s LV PW:         0.80 cm   LV E/e' medial:  11.1 LV IVS:        0.60 cm   LV e' lateral:   12.60 cm/s LVOT diam:     1.90 cm   LV E/e' lateral: 9.7 LV SV:         58 LV SV Index:   35 LVOT Area:     2.84 cm   RIGHT VENTRICLE             IVC RV Basal diam:  2.50 cm     IVC diam: 2.00 cm RV S prime:     14.80 cm/s TAPSE (M-mode): 1.8 cm  LEFT ATRIUM             Index        RIGHT ATRIUM          Index LA diam:        3.00 cm  1.81 cm/m   RA Area:     9.63 cm LA Vol (A2C):   37.3 ml 22.46 ml/m  RA Volume:   19.90 ml 11.98 ml/m LA Vol (A4C):   31.1 ml 18.73 ml/m LA Biplane Vol: 34.4 ml 20.72 ml/m AORTIC VALVE LVOT Vmax:   117.00 cm/s LVOT Vmean:  77.300 cm/s LVOT VTI:    0.206 m  AORTA Ao Root diam: 2.70 cm Ao Asc diam:  2.60 cm  MITRAL VALVE                TRICUSPID VALVE MV Area (PHT): 5.02 cm     TR Peak grad:   36.0 mmHg MV Decel Time: 151 msec     TR Vmax:        300.00 cm/s MV E velocity: 122.00 cm/s MV A velocity: 96.30 cm/s   SHUNTS MV E/A ratio:  1.27         Systemic VTI:  0.21 m Systemic Diam: 1.90 cm  Vishnu Priya Mallipeddi Electronically signed by Diannah Late Mallipeddi Signature Date/Time: 11/10/2023/4:55:23 PM    Final          ______________________________________________________________________________________________       Current Reported Medications:.    Current Meds  Medication Sig   acetaminophen  (TYLENOL ) 500 MG tablet Take 1,000 mg by mouth daily as needed for mild pain (pain score 1-3) or moderate pain (pain score 4-6).   chlordiazePOXIDE  (LIBRIUM ) 10 MG capsule Take 1 capsule (10 mg total) by mouth 3 (three) times daily for 2 days, THEN 1 capsule (10 mg total) in the morning and at bedtime for 2 days, THEN 1 capsule (10 mg total) daily for 2 days.   famotidine  (PEPCID ) 40 MG tablet Take 1 tablet (40 mg total) by mouth daily. (Patient taking differently:  Take 40 mg by mouth 2 (two) times daily.)   folic acid  (FOLVITE ) 1 MG tablet Take 1 tablet (1 mg total) by mouth daily. (Patient taking differently: Take 1 mg by mouth 2 (two) times daily.)   lidocaine  (XYLOCAINE ) 2 % solution Use as directed 15 mLs in the mouth or throat every 6 (six) hours as needed for mouth pain.   lipase/protease/amylase (CREON ) 36000 UNITS CPEP capsule Take 2 capsules (72,000 Units total) by mouth 3 (three) times daily with meals. May also take 1 capsule (36,000 Units total) as needed  (with snacks).   loperamide  (IMODIUM  A-D) 2 MG tablet Take 2 mg by mouth 4 (four) times daily as needed for diarrhea or loose stools.   magnesium  oxide (MAG-OX) 400 (240 Mg) MG tablet Take 1 tablet (400 mg total) by mouth daily.   Multiple Vitamin (MULTIVITAMIN WITH MINERALS) TABS tablet Take 1 tablet by mouth daily. (Patient taking differently: Take 2 tablets by mouth daily.)   nicotine  (NICODERM CQ  - DOSED IN MG/24 HOURS) 21 mg/24hr patch Place 1 patch (21 mg total) onto the skin daily.   ondansetron  (ZOFRAN -ODT) 4 MG disintegrating tablet Take 1 tablet (4 mg total) by mouth every 8 (eight) hours as needed.   oxyCODONE -acetaminophen  (PERCOCET) 5-325 MG tablet Take 1 tablet by mouth every 4 (four) hours as needed.   pantoprazole  (PROTONIX ) 40 MG tablet Take 1 tablet (40 mg total) by mouth daily.   polyethylene glycol powder (GLYCOLAX /MIRALAX ) 17 GM/SCOOP powder Take 17 g by mouth daily as needed for mild constipation.   sucralfate  (CARAFATE ) 1 g tablet Take 1 g by mouth 3 (three) times daily with meals.   [DISCONTINUED] carvedilol  (COREG ) 25 MG tablet Take 1 tablet (25 mg total) by mouth 2 (two) times daily with a meal.    Physical Exam:    VS:  BP 118/78   Pulse 76   Ht 5' 8 (1.727 m)   Wt 126 lb 11.2 oz (57.5 kg)   SpO2 98%   BMI 19.26 kg/m    Wt Readings from Last 3 Encounters:  12/08/23 126 lb 11.2 oz (57.5 kg)  12/03/23 120 lb 1.6 oz (54.5 kg)  11/28/23 125 lb (56.7 kg)    GEN: Well nourished, well developed in no acute distress NECK: No JVD; No carotid bruits CARDIAC: RRR, no murmurs, rubs, gallops RESPIRATORY:  Clear to auscultation without rales, wheezing or rhonchi  ABDOMEN: Soft, non-tender, non-distended EXTREMITIES:  No edema; No acute deformity     Asessement and Plan:.    Paroxysmal atrial fibrillation/?WPW: Patient has a reported history of WPW and reports undergoing ablation in 2014.  Recently this diagnosis has been questioned, there is no records of ablation  available in chart.  EKGs over the years have shown short PR interval, no delta waves. Patient recently admitted with chest pain, vomiting, found to be in A-fib with RVR, K3.4 mag 1.1, given IV procainamide  and IV magnesium  and converted to normal sinus rhythm.  It was felt that A-fib was driven by low magnesium  and potassium in setting of vomiting, diarrhea and ongoing alcohol  use. Patient has not been started on [facility] due to low CHA2DS2-VASc as well as ongoing alcohol  abuse.  Patient has returned ZIO monitor, results are currently in review as noted on ZIO suite.  Patient continues to endorse intermittent palpitations described a slight fluttering sensation.  Patient reports that he has a follow-up with his PCP next week for repeat lab work, deferred today.  He reports that he  has been taking all medications as prescribed, reports that he does continue to consume 3 12 ounce alcoholic beverages a day, denies any cocaine use in the last 2 weeks.  Chest pain: At last admission patient reported a squeezing feeling in his chest, worsens with deep breaths and eating, reproducible on palpation.  High sensitive troponin 6>27>20.  Mild elevation in flat trend, not consistent with ACS.  Suspected to be from demand ischemia from A-fib with RVR.  CTA chest on 11/21/2023 showed no coronary artery calcifications, echo from 7/4 showed EF 7075%, no wall motion abnormalities patient continues to endorse a left-sided chest pain that is intermittent, typically associated with eating and is reproducible on palpation today, question if related to his history of chronic pancreatitis.  Overall atypical for cardiac chest pain. Patient previously noted to not to be a good candidate for cath/ischemic evaluation given ongoing alcohol  and cocaine use.  No further cardiac workup planned at this time.  Hypertension: Blood pressure today 118/78.  Continue carvedilol  25 mg twice daily.   Disposition: F/u with Dr. Lavona in 2 months.    Signed, Emi Lymon D Lula Michaux, NP

## 2023-12-08 NOTE — Patient Instructions (Signed)
 Medication Instructions:  No changes *If you need a refill on your cardiac medications before your next appointment, please call your pharmacy*  Lab Work: No labs  Testing/Procedures: No testing  Follow-Up: At [facility], you and your health needs are our priority.  As part of our continuing mission to provide you with exceptional heart care, our providers are all part of one team.  This team includes your primary Cardiologist (physician) and Advanced Practice Providers or APPs (Physician Assistants and Nurse Practitioners) who all work together to provide you with the care you need, when you need it.  Your next appointment:   3-4 month(s)  Provider:   Lynwood Schilling, MD    We recommend signing up for the patient portal called MyChart.  Sign up information is provided on this After Visit Summary.  MyChart is used to connect with patients for Virtual Visits (Telemedicine).  Patients are able to view lab/test results, encounter notes, upcoming appointments, etc.  Non-urgent messages can be sent to your provider as well.   To learn more about what you can do with MyChart, go to [email or web address].

## 2023-12-09 ENCOUNTER — Encounter: Payer: Self-pay | Admitting: Cardiology

## 2023-12-11 ENCOUNTER — Encounter (HOSPITAL_COMMUNITY): Payer: Self-pay

## 2023-12-11 ENCOUNTER — Emergency Department (HOSPITAL_COMMUNITY): Payer: MEDICAID

## 2023-12-11 ENCOUNTER — Other Ambulatory Visit: Payer: Self-pay

## 2023-12-11 ENCOUNTER — Emergency Department (HOSPITAL_COMMUNITY)
Admission: EM | Admit: 2023-12-11 | Discharge: 2023-12-11 | Disposition: A | Discharge status: Home / Self Care | Payer: MEDICAID

## 2023-12-11 DIAGNOSIS — Z91040 Latex allergy status: Secondary | ICD-10-CM | POA: Diagnosis not present

## 2023-12-11 DIAGNOSIS — K860 Alcohol-induced chronic pancreatitis: Secondary | ICD-10-CM | POA: Insufficient documentation

## 2023-12-11 DIAGNOSIS — R1013 Epigastric pain: Secondary | ICD-10-CM | POA: Diagnosis present

## 2023-12-11 LAB — COMPREHENSIVE METABOLIC PANEL WITH GFR
ALT: 39 U/L (ref 0–44)
AST: 68 U/L — ABNORMAL HIGH (ref 15–41)
Albumin: 4.6 g/dL (ref 3.5–5.0)
Alkaline Phosphatase: 82 U/L (ref 38–126)
Anion gap: 17 — ABNORMAL HIGH (ref 5–15)
BUN: 14 mg/dL (ref 6–20)
CO2: 13 mmol/L — ABNORMAL LOW (ref 22–32)
Calcium: 8.9 mg/dL (ref 8.9–10.3)
Chloride: 106 mmol/L (ref 98–111)
Creatinine, Ser: 1.28 mg/dL — ABNORMAL HIGH (ref 0.61–1.24)
GFR, Estimated: >60 mL/min (ref >60)
Glucose, Bld: 121 mg/dL — ABNORMAL HIGH (ref 70–99)
Potassium: 4.3 mmol/L (ref 3.5–5.1)
Sodium: 136 mmol/L (ref 135–145)
Total Bilirubin: 1.1 mg/dL (ref 0.0–1.2)
Total Protein: 8.6 g/dL — ABNORMAL HIGH (ref 6.5–8.1)

## 2023-12-11 LAB — CBC
HCT: 37.5 % — ABNORMAL LOW (ref 39.0–52.0)
Hemoglobin: 12.5 g/dL — ABNORMAL LOW (ref 13.0–17.0)
MCH: 29.1 pg (ref 26.0–34.0)
MCHC: 33.3 g/dL (ref 30.0–36.0)
MCV: 87.4 fL (ref 80.0–100.0)
Platelets: 185 K/uL (ref 150–400)
RBC: 4.29 MIL/uL (ref 4.22–5.81)
RDW: 15.2 % (ref 11.5–15.5)
WBC: 6.5 K/uL (ref 4.0–10.5)
nRBC: 0 % (ref 0.0–0.2)

## 2023-12-11 LAB — TROPONIN I (HIGH SENSITIVITY)
Troponin I (High Sensitivity): 7 ng/L
Troponin I (High Sensitivity): 6 ng/L (ref <18)

## 2023-12-11 LAB — LIPASE, BLOOD: Lipase: 27 U/L (ref 11–51)

## 2023-12-11 MED ORDER — IOHEXOL 350 MG/ML SOLN
75.0000 mL | Freq: Once | INTRAVENOUS | Status: AC | PRN
  Administered 2023-12-11: 75 mL via INTRAVENOUS

## 2023-12-11 MED ORDER — MORPHINE SULFATE (PF) 4 MG/ML IV SOLN
4.0000 mg | Freq: Once | INTRAVENOUS | Status: AC
  Administered 2023-12-11: 4 mg via INTRAVENOUS
  Filled 2023-12-11: qty 1

## 2023-12-11 MED ORDER — LACTATED RINGERS IV BOLUS: 1000.0000 mL | Freq: Once | INTRAVENOUS | Status: DC

## 2023-12-11 MED ORDER — LACTATED RINGERS IV BOLUS
1000.0000 mL | Freq: Once | INTRAVENOUS | Status: AC
  Administered 2023-12-11: 1000 mL via INTRAVENOUS

## 2023-12-11 MED ORDER — PROCHLORPERAZINE EDISYLATE 10 MG/2ML IJ SOLN
10.0000 mg | Freq: Once | INTRAMUSCULAR | Status: AC
  Administered 2023-12-11: 10 mg via INTRAVENOUS
  Filled 2023-12-11: qty 2

## 2023-12-11 MED ORDER — DIPHENHYDRAMINE HCL 25 MG PO CAPS: 50.0000 mg | ORAL_CAPSULE | Freq: Once | ORAL | Status: AC

## 2023-12-11 MED ORDER — DIPHENHYDRAMINE HCL 50 MG/ML IJ SOLN
50.0000 mg | Freq: Once | INTRAMUSCULAR | Status: AC
  Administered 2023-12-11: 50 mg via INTRAVENOUS
  Filled 2023-12-11: qty 1

## 2023-12-11 MED ORDER — OXYCODONE-ACETAMINOPHEN 5-325 MG PO TABS
1.0000 | ORAL_TABLET | Freq: Once | ORAL | Status: AC
  Administered 2023-12-11: 1 via ORAL
  Filled 2023-12-11: qty 1

## 2023-12-11 MED ORDER — METHYLPREDNISOLONE SODIUM SUCC 40 MG IJ SOLR
40.0000 mg | Freq: Once | INTRAMUSCULAR | Status: AC
  Administered 2023-12-11: 40 mg via INTRAVENOUS
  Filled 2023-12-11: qty 1

## 2023-12-11 MED ORDER — ONDANSETRON 4 MG PO TBDP
4.0000 mg | ORAL_TABLET | Freq: Once | ORAL | Status: AC
  Administered 2023-12-11: 4 mg via ORAL
  Filled 2023-12-11: qty 1

## 2023-12-11 NOTE — ED Provider Notes (Signed)
 Patient care assumed from previous provider.   Patient care of [name] is a 53 y.o. male from previous provider. Please see the original provider note from this emergency department encounter for full history and physical.   Course of Care and my assessment at the time of sign out is detailed in the ED Course below.   Clinical Course as of 12/11/23 2007  Mon Dec 11, 2023  1334 Chest x-ray reviewed by me: Trachea is midline, no pneumothorax.  No evidence of pleural effusion, cardiomegaly, pulmonary edema.  No subdiaphragmatic free air.  No evidence of focal consolidative findings [AG]  1335 EKG read by me: Normal sinus rhythm without evidence of acute ischemic change [AG]  1519 S- polysubstance abuse, pancreatitis Lipase normal C/u ct and dtrt  [RC]  2005 Chronic pancreatitis changes including pseudocyst which is unchanged from prior.  Nonspecific gastric inflammation [RC]  2006 CT ABDOMEN PELVIS W CONTRAST  radiology read concerning for possible internal hernia with no associated inflammation [RC]  2006 Troponin I (High Sensitivity): 6 [RC]    Clinical Course User Index [AG] Nada Chroman, DO [RC] Sharyne Darina RAMAN, MD    On my reassessment patient is hemodynamically intact.  He does have some epigastric abdominal pain.  CT scan concerning for some chronic pancreatitis with no acute component.  He also has signs of gastritis.  I do believe this is a likely cause of his pain.  He had an ACS workup that was reassuring by previous team.  He does have a mild AKI however LR was given.  Given there are no acute emergent intra-abdominal findings on CT I believe he is appropriate for discharge with close follow-up with PCP and strict return precautions.  Labs reviewed by myself and considered in medical decision making.  Imaging reviewed by myself and considered in medical decision making. Imaging final read interpreted by radiology.  1. Alcohol -induced chronic pancreatitis [facility])      Discharge   The plan for this patient was discussed with Dr. Tonia Chew, MD , who voiced agreement and who oversaw evaluation and treatment of this patient.    Sharyne Darina RAMAN, MD 12/11/23 [id number]    Tonia Chew, MD 12/12/23 [phone]

## 2023-12-11 NOTE — ED Notes (Signed)
 Phlebotomy contacted to obtain second trop after two unsuccessful attempts by this paramedic

## 2023-12-11 NOTE — ED Notes (Signed)
 Phlebotomy at bedside.

## 2023-12-11 NOTE — ED Provider Triage Note (Signed)
 Emergency Medicine Provider Triage Evaluation Note  Qaadir Elbie Statzer , a 53 y.o. male  was evaluated in triage.  Pt complains of abdominal pain similar to chronic pancreatitis.  Review of Systems  Positive: Abdominal pain, chest pain, nausea, vomiting Negative: Syncope, shortness of breath, hematemesis  Physical Exam  BP (!) 158/118   Pulse 97   Temp 98.4 F (36.9 C)   Resp 18   Ht 5' 8 (1.727 m)   Wt 56.7 kg   SpO2 100%   BMI 19.01 kg/m  Gen:   Awake, no distress   Resp:  Normal effort  MSK:   Moves extremities without difficulty  Other:    Medical Decision Making  Medically screening exam initiated at 12:10 PM.  Appropriate orders placed.  Kishan Ralpheal Zappone was informed that the remainder of the evaluation will be completed by another provider, this initial triage assessment does not replace that evaluation, and the importance of remaining in the ED until their evaluation is complete.     Francesca Elsie CROME, MD 12/11/23 [phone]

## 2023-12-11 NOTE — ED Triage Notes (Signed)
 Pt coming in with chest pain x 1 hour. Pt complaining of feeling like his heart is racing, sharp pains in his central chest.

## 2023-12-11 NOTE — ED Provider Notes (Signed)
 [place] [facility]MENT AT [facility] Provider Note   CSN: [id number] Arrival date & time: 12/11/23  1150     Patient presents with: Chest Pain   [name] is a 53 y.o. male past medical history significant for polysubstance use, alcohol  use disorder and chronic pancreatitis who presents emergency department for epigastric abdominal pain.  Patient states that his last ingestion of alcohol  was 2 days ago.  Patient states that he has had 24 hours of epigastric abdominal pain with associated nausea bloody and nonbilious emesis.  Patient states that his presentation is similar to prior acute on chronic pancreatitis presentations.  Patient does endorse cocaine use this past Friday however denies IV drug use.  She denies associated fever, hematemesis, hematochezia or melena.  Patient has been unable to tolerate p.o. intake secondary to emesis and abdominal pain    Chest Pain      Prior to Admission medications   Medication Sig Start Date End Date Taking? Authorizing Provider  acetaminophen  (TYLENOL ) 500 MG tablet Take 1,000 mg by mouth daily as needed for mild pain (pain score 1-3) or moderate pain (pain score 4-6).    [provider]  carvedilol  (COREG ) 25 MG tablet Take 1 tablet (25 mg total) by mouth 2 (two) times daily with a meal. 12/08/23   West, Katlyn D, NP  chlordiazePOXIDE  (LIBRIUM ) 10 MG capsule Take 1 capsule (10 mg total) by mouth 3 (three) times daily for 2 days, THEN 1 capsule (10 mg total) in the morning and at bedtime for 2 days, THEN 1 capsule (10 mg total) daily for 2 days. 12/05/23 12/11/23  Gonfa, Taye T, MD  famotidine  (PEPCID ) 40 MG tablet Take 1 tablet (40 mg total) by mouth daily. Patient taking differently: Take 40 mg by mouth 2 (two) times daily. 10/17/23   Henderly, Britni A, PA-C  folic acid  (FOLVITE ) 1 MG tablet Take 1 tablet (1 mg total) by mouth daily. Patient taking differently: Take 1 mg by mouth 2 (two) times daily. 11/23/23  12/23/23  Vernon Ranks, MD  lidocaine  (XYLOCAINE ) 2 % solution Use as directed 15 mLs in the mouth or throat every 6 (six) hours as needed for mouth pain. 12/02/23   Charlyn Sora, MD  lipase/protease/amylase (CREON ) 36000 UNITS CPEP capsule Take 2 capsules (72,000 Units total) by mouth 3 (three) times daily with meals. May also take 1 capsule (36,000 Units total) as needed (with snacks). 08/21/23   Ghimire, Donalda HERO, MD  loperamide  (IMODIUM  A-D) 2 MG tablet Take 2 mg by mouth 4 (four) times daily as needed for diarrhea or loose stools.    [provider]  magnesium  oxide (MAG-OX) 400 (240 Mg) MG tablet Take 1 tablet (400 mg total) by mouth daily. 11/07/23   Hoy Nidia FALCON, PA-C  Multiple Vitamin (MULTIVITAMIN WITH MINERALS) TABS tablet Take 1 tablet by mouth daily. Patient taking differently: Take 2 tablets by mouth daily. 08/21/23   Ghimire, Donalda HERO, MD  nicotine  (NICODERM CQ  - DOSED IN MG/24 HOURS) 21 mg/24hr patch Place 1 patch (21 mg total) onto the skin daily. 11/14/23   Amin, Ankit C, MD  ondansetron  (ZOFRAN -ODT) 4 MG disintegrating tablet Take 1 tablet (4 mg total) by mouth every 8 (eight) hours as needed. 11/23/23   Pahwani, Ravi, MD  oxyCODONE -acetaminophen  (PERCOCET) 5-325 MG tablet Take 1 tablet by mouth every 4 (four) hours as needed. 11/28/23   Jarold Olam HERO, PA-C  pantoprazole  (PROTONIX ) 40 MG tablet Take 1 tablet (40 mg  total) by mouth daily. 12/02/23   Nanavati, Ankit, MD  polyethylene glycol powder (GLYCOLAX /MIRALAX ) 17 GM/SCOOP powder Take 17 g by mouth daily as needed for mild constipation. 11/13/23   Amin, Ankit C, MD  sucralfate  (CARAFATE ) 1 g tablet Take 1 g by mouth 3 (three) times daily with meals.    [provider]  tamsulosin  (FLOMAX ) 0.4 MG CAPS capsule Take 1 capsule (0.4 mg total) by mouth daily. Patient not taking: Reported on 12/08/2023 08/21/23   Raenelle Donalda HERO, MD  amitriptyline  (ELAVIL ) 25 MG tablet Take 1 tablet (25 mg total) by mouth at  bedtime. Patient not taking: Reported on 08/08/2019 10/15/18 08/08/19  Tobie Yetta HERO, MD    Allergies: Robaxin  [methocarbamol ], Bayer aspirin  [aspirin ], Trazodone  and nefazodone, Adhesive [tape], Latex, Toradol  [ketorolac  tromethamine ], Contrast media [iodinated contrast media], Fish allergy, Reglan  [metoclopramide ], Salmon [fish oil], and Shellfish allergy    Review of Systems  Cardiovascular:  Positive for chest pain.    Updated Vital Signs BP (!) 140/98   Pulse 72   Temp 98.4 F (36.9 C) (Oral)   Resp 13   Ht 5' 8 (1.727 m)   Wt 56.7 kg   SpO2 100%   BMI 19.01 kg/m   Physical Exam Vitals reviewed.  Constitutional:      General: He is in acute distress.     Appearance: He is not ill-appearing.     Comments: Acute distress secondary to abdominal pain  Cardiovascular:     Rate and Rhythm: Normal rate and regular rhythm.     Heart sounds: Normal heart sounds.  Pulmonary:     Effort: Pulmonary effort is normal. No tachypnea.     Breath sounds: Normal breath sounds.  Abdominal:     Tenderness: There is abdominal tenderness in the epigastric area. There is guarding. There is no rebound.  Musculoskeletal:     Comments: Spontaneous movement of bilateral upper and lower extremities  Skin:    Capillary Refill: Capillary refill takes less than 2 seconds.  Neurological:     Mental Status: He is alert.  Psychiatric:        Behavior: Behavior is cooperative.     (all labs ordered are listed, but only abnormal results are displayed) Labs Reviewed  CBC - Abnormal; Notable for the following components:      Result Value   Hemoglobin 12.5 (*)    HCT 37.5 (*)    All other components within normal limits  COMPREHENSIVE METABOLIC PANEL WITH GFR - Abnormal; Notable for the following components:   CO2 13 (*)    Glucose, Bld 121 (*)    Creatinine, Ser 1.28 (*)    Total Protein 8.6 (*)    AST 68 (*)    Anion gap 17 (*)    All other components within normal limits  LIPASE, BLOOD   TROPONIN I (HIGH SENSITIVITY)  TROPONIN I (HIGH SENSITIVITY)    EKG: EKG Interpretation Date/Time:  Monday December 11 2023 11:50:52 EDT Ventricular Rate:  94 PR Interval:  102 QRS Duration:  72 QT Interval:  344 QTC Calculation: 430 R Axis:   76  Text Interpretation: Sinus rhythm with short PR Right atrial enlargement Borderline ECG Confirmed by Simon Rea [phone]) on 12/11/2023 2:16:29 PM  Radiology: DG Chest 2 View Result Date: 12/11/2023 CLINICAL DATA:  Chest pain EXAM: CHEST - 2 VIEW COMPARISON:  12/03/2023 FINDINGS: The heart size and mediastinal contours are within normal limits. Both lungs are clear. The visualized skeletal structures are unremarkable.  No pneumothorax. IMPRESSION: No active cardiopulmonary disease. Electronically Signed   By: Franky Crease M.D.   On: 12/11/2023 12:26     Procedures   Medications Ordered in the ED  diphenhydrAMINE  (BENADRYL ) capsule 50 mg (has no administration in time range)    Or  diphenhydrAMINE  (BENADRYL ) injection 50 mg (has no administration in time range)  oxyCODONE -acetaminophen  (PERCOCET/ROXICET) 5-325 MG per tablet 1 tablet (1 tablet Oral Given 12/11/23 1233)  ondansetron  (ZOFRAN -ODT) disintegrating tablet 4 mg (4 mg Oral Given 12/11/23 1234)  lactated ringers  bolus 1,000 mL (0 mLs Intravenous Stopped 12/11/23 1445)  morphine  (PF) 4 MG/ML injection 4 mg (4 mg Intravenous Given 12/11/23 1335)  prochlorperazine  (COMPAZINE ) injection 10 mg (10 mg Intravenous Given 12/11/23 1420)  methylPREDNISolone  sodium succinate (SOLU-MEDROL ) 40 mg/mL injection 40 mg (40 mg Intravenous Given 12/11/23 1431)  morphine  (PF) 4 MG/ML injection 4 mg (4 mg Intravenous Given 12/11/23 1457)    Clinical Course as of 12/11/23 1625  Mon Dec 11, 2023  1334 Chest x-ray reviewed by me: Trachea is midline, no pneumothorax.  No evidence of pleural effusion, cardiomegaly, pulmonary edema.  No subdiaphragmatic free air.  No evidence of focal consolidative findings [AG]  1335 EKG  read by me: Normal sinus rhythm without evidence of acute ischemic change [AG]  1519 S- polysubstance abuse, pancreatitis Lipase normal C/u ct and dtrt  [RC]    Clinical Course User Index [AG] Nada Chroman, DO [RC] Sharyne Darina RAMAN, MD                                 Medical Decision Making Amount and/or Complexity of Data Reviewed Labs: ordered. Radiology: ordered.  Risk Prescription drug management.   On initial evaluation patient is hemodynamically stable, afebrile however in acute distress secondary to epigastric abdominal pain.  Based on patient's history and physical examination findings differential diagnosis includes gastritis, gastroenteritis, pancreatitis, acute cholecystitis.  Will obtain laboratory studies, pain control, antiemetics and give fluid bolus with concern for acute on chronic pancreatitis.  Laboratory studies obtained with lipase within normal limits.  Will obtain CT imaging to assess for acute intra-abdominal abnormality.  Patient does have history of urticaria to contrast therefore we will pretreat with Solu-Medrol  and Benadryl .  Patient will be signed out to oncoming team with CT abdomen pelvis with contrast pending however high suspicion for acute on chronic pancreatitis secondary to chronic alcohol  use as patient has epigastric abdominal pain with associated nausea and emesis.     Final diagnoses:  Alcohol -induced chronic pancreatitis [facility])    ED Discharge Orders     None       Chroman Nada DO Emergency Medicine PGY2   Yiselle Babich, DO 12/11/23 1625    Simon Lavonia SAILOR, MD 12/12/23 [phone]

## 2023-12-11 NOTE — Discharge Instructions (Signed)
 You were seen today for abdominal pain and chest pain. While you were here we monitored your vitals, preformed a physical exam, and labs and imaging. These were all reassuring and there is no indication for any further testing or intervention in the emergency department at this time.   Things to do:  - Follow up with your primary care provider within the next 1-2 weeks - Rest your bowels, eat bland foods and drink plenty of water .  Please refrain from alcohol  use as best as possible as it may make your pancreatitis worse.  Return to the emergency department if you have any new or worsening symptoms including difficulty breathing, inability to tolerate food or drinks.  Concern for dehydration, or if you have any other concerns.

## 2023-12-11 NOTE — ED Notes (Signed)
 Pt ambulated to restroom.

## 2023-12-11 NOTE — ED Notes (Signed)
 Phlebotomy asked again to get second troponin

## 2023-12-12 NOTE — Addendum Note (Signed)
 Encounter addended by: Malvina Pina A on: 12/12/2023 9:34 AM  Actions taken: Imaging Exam ended

## 2023-12-13 DIAGNOSIS — R55 Syncope and collapse: Secondary | ICD-10-CM | POA: Diagnosis not present

## 2023-12-14 IMAGING — DX DG CHEST 2V
2 series · 2 of 2 positions shown · non-contrast
Comparison: June 06, 2021

CLINICAL DATA: Chest pain

EXAM:
CHEST - 2 VIEW

[w chest pa]
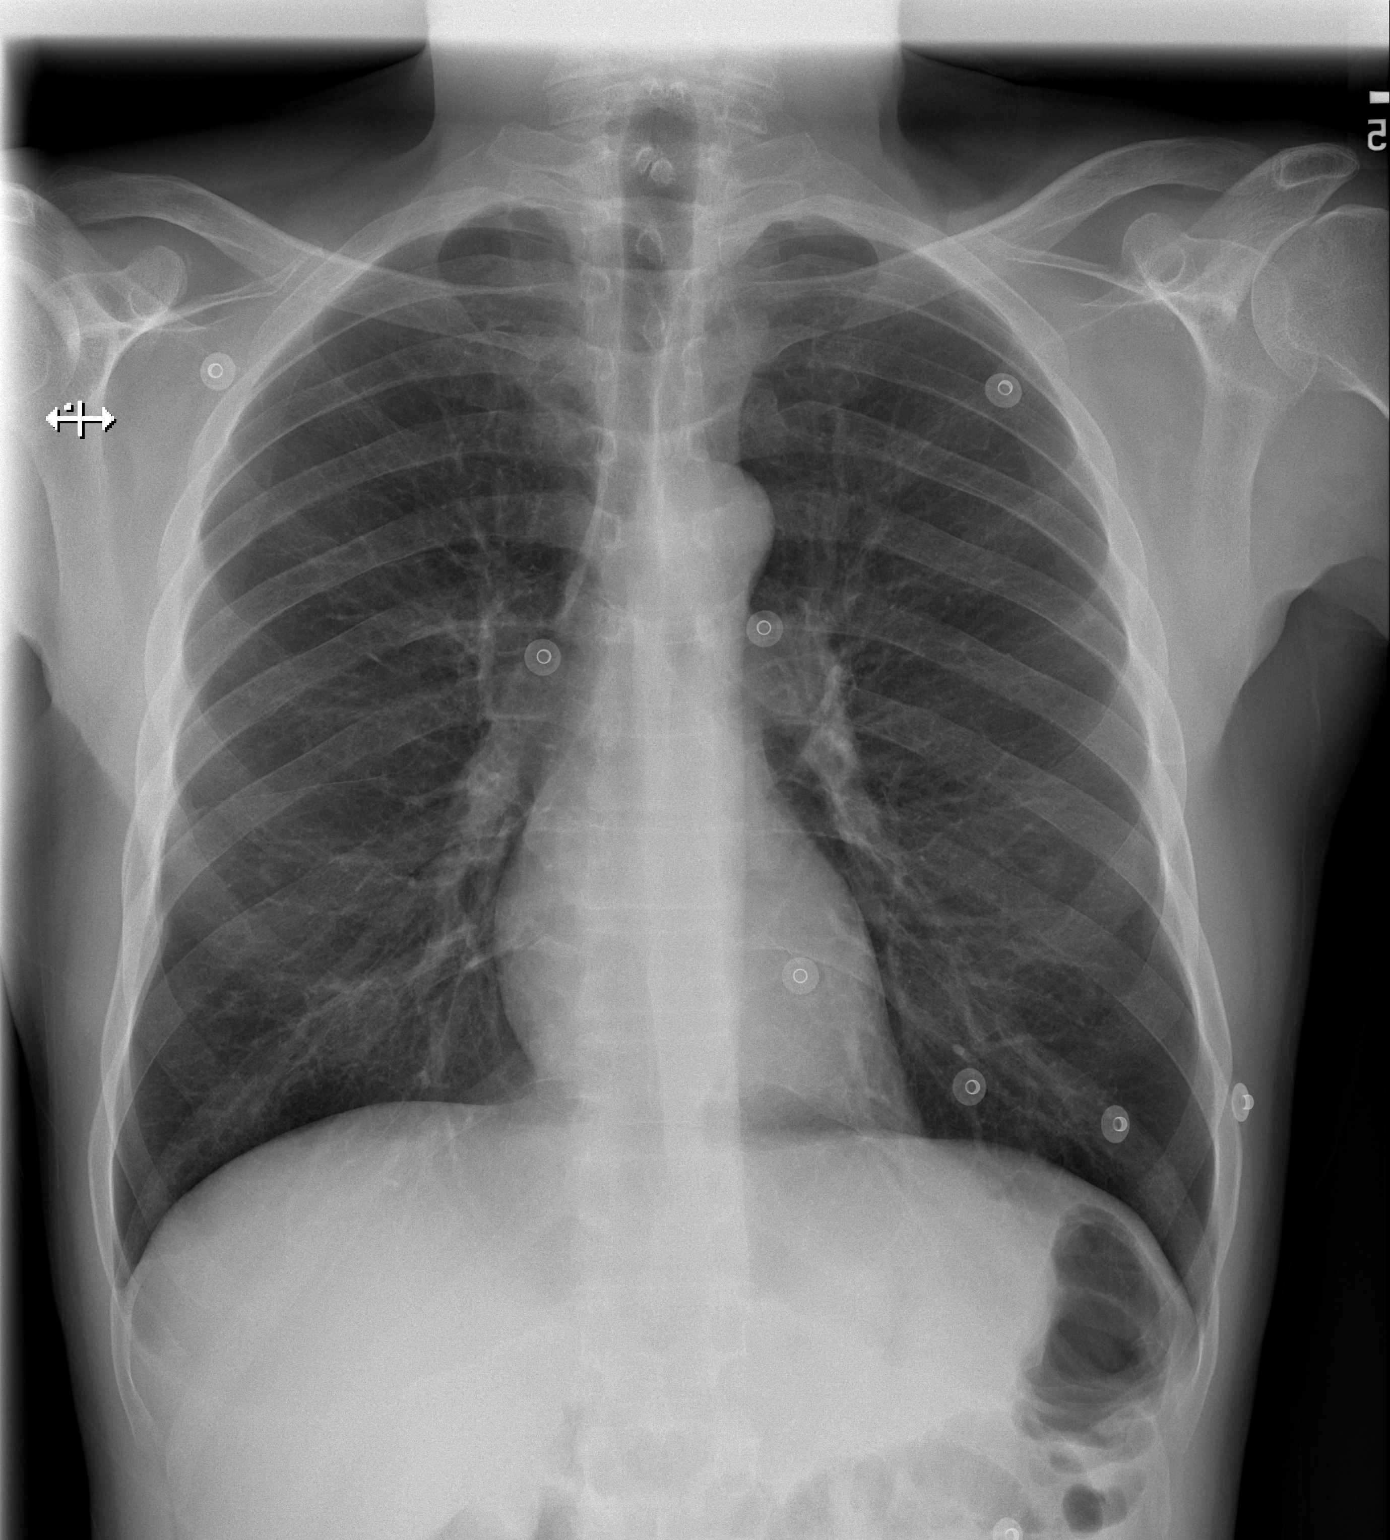

[w chest lat]
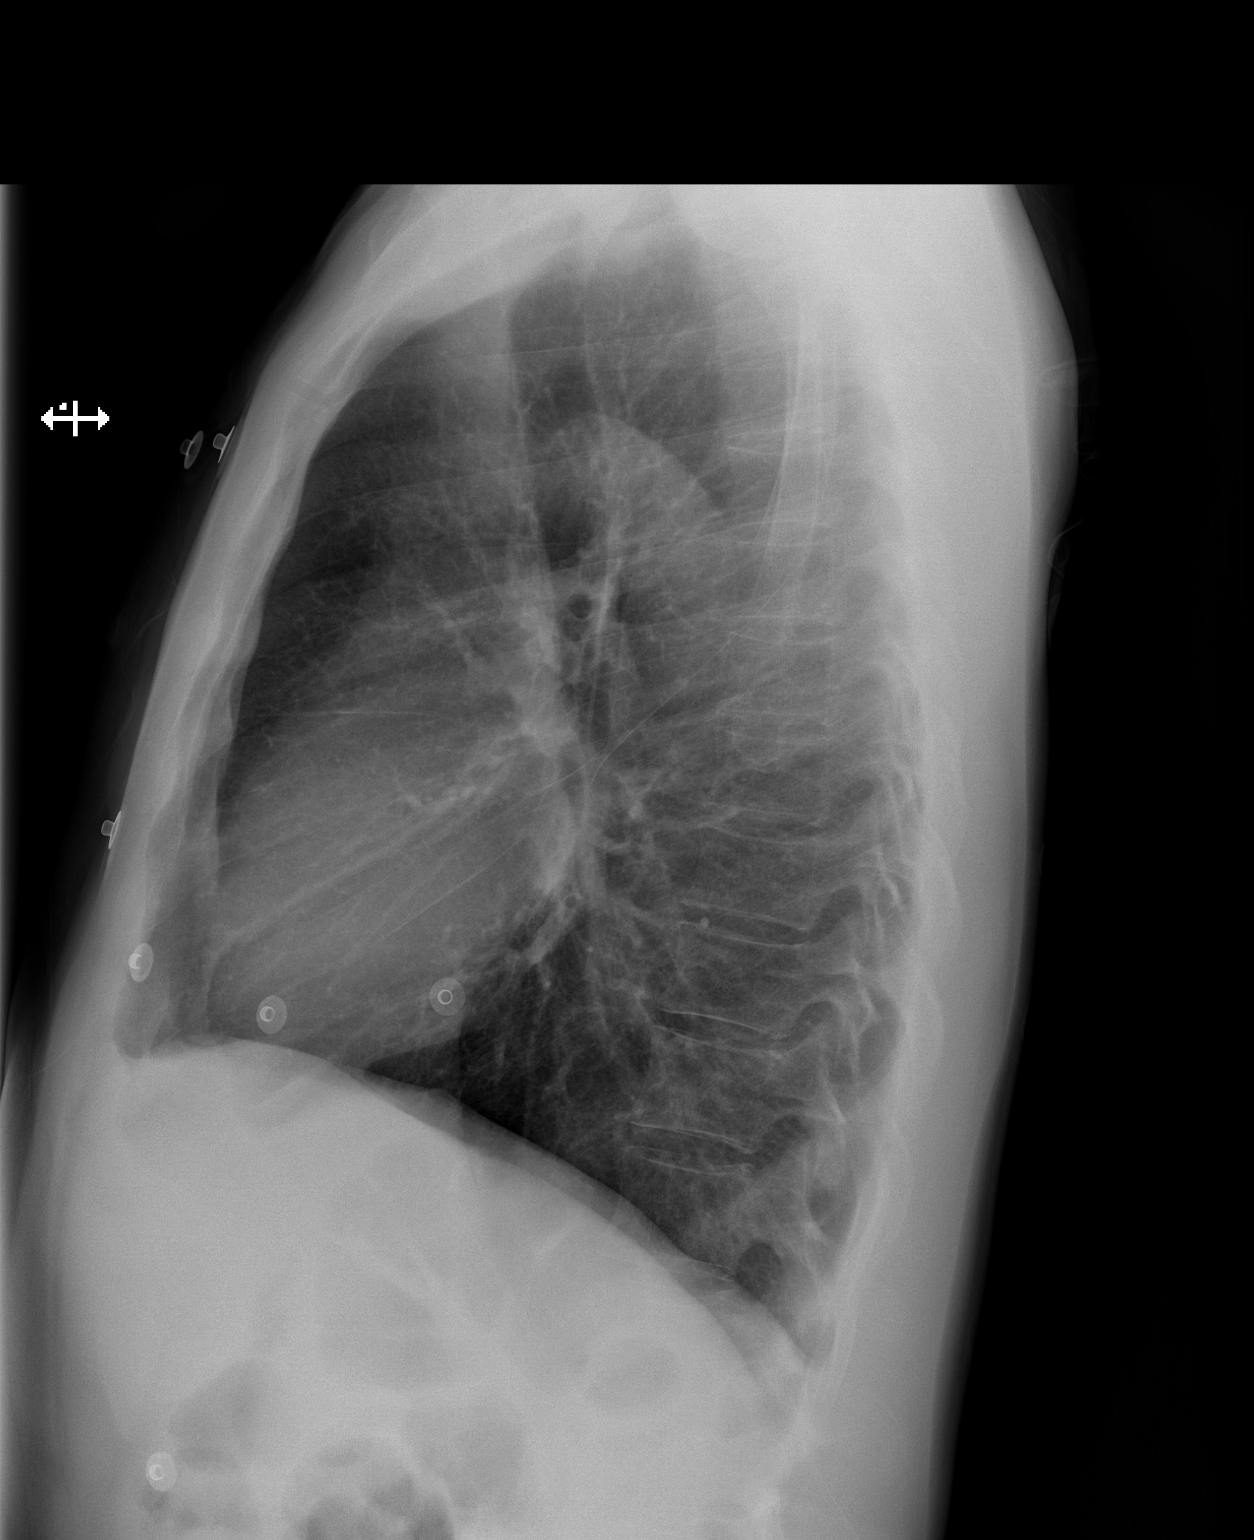

[2 of 2 positions shown; findings below may reference images not displayed]

FINDINGS: The heart size and mediastinal contours are within normal limits. No
focal consolidation. No pleural effusion. No pneumothorax. The
visualized skeletal structures are unremarkable.
IMPRESSION: No acute cardiopulmonary disease.

## 2023-12-15 ENCOUNTER — Emergency Department (HOSPITAL_COMMUNITY)
Admission: EM | Admit: 2023-12-15 | Discharge: 2023-12-16 | Disposition: A | Discharge status: Home / Self Care | Payer: MEDICAID | Attending: Emergency Medicine | Admitting: Emergency Medicine

## 2023-12-15 ENCOUNTER — Emergency Department (HOSPITAL_COMMUNITY): Payer: MEDICAID

## 2023-12-15 ENCOUNTER — Other Ambulatory Visit: Payer: Self-pay

## 2023-12-15 DIAGNOSIS — Y903 Blood alcohol level of 60-79 mg/100 ml: Secondary | ICD-10-CM | POA: Insufficient documentation

## 2023-12-15 DIAGNOSIS — F1990 Other psychoactive substance use, unspecified, uncomplicated: Secondary | ICD-10-CM

## 2023-12-15 DIAGNOSIS — R000 Tachycardia, unspecified: Secondary | ICD-10-CM | POA: Diagnosis not present

## 2023-12-15 DIAGNOSIS — G8929 Other chronic pain: Secondary | ICD-10-CM | POA: Diagnosis not present

## 2023-12-15 DIAGNOSIS — F1090 Alcohol use, unspecified, uncomplicated: Secondary | ICD-10-CM | POA: Insufficient documentation

## 2023-12-15 DIAGNOSIS — R109 Unspecified abdominal pain: Secondary | ICD-10-CM | POA: Diagnosis present

## 2023-12-15 DIAGNOSIS — F1910 Other psychoactive substance abuse, uncomplicated: Secondary | ICD-10-CM | POA: Diagnosis not present

## 2023-12-15 DIAGNOSIS — R1013 Epigastric pain: Secondary | ICD-10-CM | POA: Diagnosis not present

## 2023-12-15 LAB — COMPREHENSIVE METABOLIC PANEL WITH GFR
ALT: 31 U/L (ref 0–44)
AST: 53 U/L — ABNORMAL HIGH (ref 15–41)
Albumin: 3.8 g/dL (ref 3.5–5.0)
Alkaline Phosphatase: 55 U/L (ref 38–126)
Anion gap: 13 (ref 5–15)
BUN: 11 mg/dL (ref 6–20)
CO2: 15 mmol/L — ABNORMAL LOW (ref 22–32)
Calcium: 8.3 mg/dL — ABNORMAL LOW (ref 8.9–10.3)
Chloride: 110 mmol/L (ref 98–111)
Creatinine, Ser: 0.91 mg/dL (ref 0.61–1.24)
GFR, Estimated: >60 mL/min (ref >60)
Glucose, Bld: 98 mg/dL (ref 70–99)
Potassium: 4.3 mmol/L (ref 3.5–5.1)
Sodium: 138 mmol/L (ref 135–145)
Total Bilirubin: 0.7 mg/dL (ref 0.0–1.2)
Total Protein: 6.8 g/dL (ref 6.5–8.1)

## 2023-12-15 LAB — CBC
HCT: 32.1 % — ABNORMAL LOW (ref 39.0–52.0)
Hemoglobin: 10.2 g/dL — ABNORMAL LOW (ref 13.0–17.0)
MCH: 29.4 pg (ref 26.0–34.0)
MCHC: 31.8 g/dL (ref 30.0–36.0)
MCV: 92.5 fL (ref 80.0–100.0)
Platelets: 161 K/uL (ref 150–400)
RBC: 3.47 MIL/uL — ABNORMAL LOW (ref 4.22–5.81)
RDW: 16.1 % — ABNORMAL HIGH (ref 11.5–15.5)
WBC: 9.8 K/uL (ref 4.0–10.5)
nRBC: 0 % (ref 0.0–0.2)

## 2023-12-15 LAB — LIPASE, BLOOD: Lipase: 36 U/L (ref 11–51)

## 2023-12-15 LAB — TROPONIN I (HIGH SENSITIVITY)
Troponin I (High Sensitivity): 5 ng/L (ref <18)
Troponin I (High Sensitivity): 5 ng/L (ref <18)

## 2023-12-15 LAB — ETHANOL: Alcohol, Ethyl (B): 63 mg/dL — ABNORMAL HIGH (ref <15)

## 2023-12-15 MED ORDER — LIDOCAINE VISCOUS HCL 2 % MT SOLN
15.0000 mL | Freq: Once | OROMUCOSAL | Status: AC
  Administered 2023-12-15: 15 mL via OROMUCOSAL
  Filled 2023-12-15: qty 15

## 2023-12-15 MED ORDER — LACTATED RINGERS IV BOLUS
500.0000 mL | Freq: Once | INTRAVENOUS | Status: AC
  Administered 2023-12-15: 500 mL via INTRAVENOUS

## 2023-12-15 MED ORDER — ALUM & MAG HYDROXIDE-SIMETH 200-200-20 MG/5ML PO SUSP
15.0000 mL | Freq: Once | ORAL | Status: AC
  Administered 2023-12-15: 15 mL via ORAL
  Filled 2023-12-15: qty 30

## 2023-12-15 MED ORDER — MAALOX MAX 400-400-40 MG/5ML PO SUSP: 5.0000 mL | Freq: Four times a day (QID) | ORAL | 0 refills | Status: DC | PRN

## 2023-12-15 MED ORDER — MAALOX MAX 400-400-40 MG/5ML PO SUSP: 5.0000 mL | Freq: Four times a day (QID) | ORAL | 0 refills | Status: AC | PRN

## 2023-12-15 MED ORDER — MORPHINE SULFATE (PF) 4 MG/ML IV SOLN
4.0000 mg | Freq: Once | INTRAVENOUS | Status: AC
  Administered 2023-12-15: 4 mg via INTRAVENOUS
  Filled 2023-12-15: qty 1

## 2023-12-15 MED ORDER — SODIUM CHLORIDE 0.9 % IV SOLN
12.5000 mg | Freq: Four times a day (QID) | INTRAVENOUS | Status: DC | PRN
  Filled 2023-12-15: qty 0.5

## 2023-12-15 MED ORDER — LORAZEPAM 1 MG PO TABS
1.0000 mg | ORAL_TABLET | Freq: Once | ORAL | Status: AC
  Administered 2023-12-15: 1 mg via SUBLINGUAL
  Filled 2023-12-15: qty 1

## 2023-12-15 NOTE — Discharge Instructions (Signed)
 Thank you for allowing us  to care for you today.  Please review the information attached regarding resources for substance abuse and alcohol  abuse counseling as well as resources for chronic pain.  Be sure to call your gastroenterologist and schedule an outpatient appointment.

## 2023-12-15 NOTE — ED Triage Notes (Signed)
 CP that started while walking to the bus stop 7/10. Also reports some n/v/d x 24 hours.  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

## 2023-12-15 NOTE — ED Triage Notes (Signed)
 Pt also reports his stool is dark.  Hx of GI bleed. Reports he thinks he's losing blood that way because it has a smell and the last time it smelled like that, he had to get 2 units of blood.

## 2023-12-15 NOTE — ED Provider Notes (Signed)
 [place] [facility] AT [facility] Provider Note   CSN: [id number] Arrival date & time: 12/15/23  1956     Patient presents with: Chest Pain   [name] is a 53 y.o. 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  Patient states recurrence of chronic chest pain and abdominal pain without change in character.  Patient does endorse associated nausea and nonbloody/non bilious emesis.  Patient denies associated diarrhea, fever, syncope.    Chest Pain      Prior to Admission medications   Medication Sig Start Date End Date Taking? Authorizing Provider  acetaminophen  (TYLENOL ) 500 MG tablet Take 1,000 mg by mouth daily as needed for mild pain (pain score 1-3) or moderate pain (pain score 4-6).    [provider]  alum & mag hydroxide-simeth (MAALOX MAX) 400-400-40 MG/5ML suspension Take 5 mLs by mouth every 6 (six) hours as needed for up to 7 days for indigestion. 12/15/23 12/22/23  Nada Chroman, DO  carvedilol  (COREG ) 25 MG tablet Take 1 tablet (25 mg total) by mouth 2 (two) times daily with a meal. 12/08/23   West, Katlyn D, NP  famotidine  (PEPCID ) 40 MG tablet Take 1 tablet (40 mg total) by mouth daily. Patient taking differently: Take 40 mg by mouth 2 (two) times daily. 10/17/23   Henderly, Britni A, PA-C  folic acid  (FOLVITE ) 1 MG tablet Take 1 tablet (1 mg total) by mouth daily. Patient taking differently: Take 1 mg by mouth 2 (two) times daily. 11/23/23 12/23/23  Vernon Ranks, MD  lidocaine  (XYLOCAINE ) 2 % solution Use as directed 15 mLs in the mouth or throat every 6 (six) hours as needed for mouth pain. 12/02/23   Charlyn Sora, MD  lipase/protease/amylase (CREON ) 36000 UNITS CPEP capsule Take 2  capsules (72,000 Units total) by mouth 3 (three) times daily with meals. May also take 1 capsule (36,000 Units total) as needed (with snacks). 08/21/23   Ghimire, Donalda HERO, MD  loperamide  (IMODIUM  A-D) 2 MG tablet Take 2 mg by mouth 4 (four) times daily as needed for diarrhea or loose stools.    [provider]  magnesium  oxide (MAG-OX) 400 (240 Mg) MG tablet Take 1 tablet (400 mg total) by mouth daily. 11/07/23   Hoy Nidia FALCON, PA-C  Multiple Vitamin (MULTIVITAMIN WITH MINERALS) TABS tablet Take 1 tablet by mouth daily. Patient taking differently: Take 2 tablets by mouth daily. 08/21/23   Ghimire, Donalda HERO, MD  nicotine  (NICODERM CQ  - DOSED IN MG/24 HOURS) 21 mg/24hr patch Place 1 patch (21 mg total) onto the skin daily. 11/14/23   Amin, Ankit C, MD  ondansetron  (ZOFRAN -ODT) 4 MG disintegrating tablet Take 1 tablet (4 mg total) by mouth every 8 (eight) hours as needed. 11/23/23   Vernon Ranks, MD  oxyCODONE -acetaminophen  (PERCOCET) 5-325 MG tablet Take 1 tablet by mouth every 4 (four) hours as needed. 11/28/23   Jarold Olam HERO, PA-C  pantoprazole  (PROTONIX ) 40 MG tablet Take 1 tablet (40 mg total) by mouth daily. 12/02/23   Nanavati, Ankit, MD  polyethylene glycol powder (GLYCOLAX /MIRALAX ) 17 GM/SCOOP powder Take 17 g by mouth daily as needed for mild constipation. 11/13/23   Amin, Ankit C, MD  sucralfate  (CARAFATE ) 1 g tablet Take 1 g by mouth 3 (three) times daily with meals.  [provider]  tamsulosin  (FLOMAX ) 0.4 MG CAPS capsule Take 1 capsule (0.4 mg total) by mouth daily. Patient not taking: Reported on 12/08/2023 08/21/23   Raenelle Donalda HERO, MD  amitriptyline  (ELAVIL ) 25 MG tablet Take 1 tablet (25 mg total) by mouth at bedtime. Patient not taking: Reported on 08/08/2019 10/15/18 08/08/19  Tobie Yetta HERO, MD    Allergies: Robaxin  [methocarbamol ], Bayer aspirin  [aspirin ], Trazodone  and nefazodone, Adhesive [tape], Latex, Toradol  [ketorolac  tromethamine ], Contrast media  [iodinated contrast media], Fish allergy, Reglan  [metoclopramide ], Salmon [fish oil], and Shellfish allergy    Review of Systems  Cardiovascular:  Positive for chest pain.    Updated Vital Signs BP (!) 152/87   Pulse 91   Temp 98.8 F (37.1 C)   Resp 11   Ht 5' 8 (1.727 m)   Wt 56 kg   SpO2 100%   BMI 18.77 kg/m   Physical Exam Vitals reviewed.  Constitutional:      General: He is not in acute distress.    Appearance: He is not ill-appearing.  HENT:     Head: Normocephalic and atraumatic.  Cardiovascular:     Rate and Rhythm: Regular rhythm. Tachycardia present.     Heart sounds: Normal heart sounds. No murmur heard. Pulmonary:     Effort: Pulmonary effort is normal. No tachypnea.  Abdominal:     Tenderness: There is abdominal tenderness in the epigastric area.  Musculoskeletal:     Right lower leg: No edema.     Left lower leg: No edema.     Comments: Spontaneous movement of bilateral upper and lower extremities  Neurological:     Mental Status: He is alert.  Psychiatric:        Behavior: Behavior is cooperative.     (all labs ordered are listed, but only abnormal results are displayed) Labs Reviewed  CBC - Abnormal; Notable for the following components:      Result Value   RBC 3.47 (*)    Hemoglobin 10.2 (*)    HCT 32.1 (*)    RDW 16.1 (*)    All other components within normal limits  COMPREHENSIVE METABOLIC PANEL WITH GFR - Abnormal; Notable for the following components:   CO2 15 (*)    Calcium  8.3 (*)    AST 53 (*)    All other components within normal limits  ETHANOL - Abnormal; Notable for the following components:   Alcohol , Ethyl (B) 63 (*)    All other components within normal limits  LIPASE, BLOOD  TROPONIN I (HIGH SENSITIVITY)  TROPONIN I (HIGH SENSITIVITY)    EKG: None  Radiology: DG Chest 2 View Result Date: 12/15/2023 CLINICAL DATA:  chest pain EXAM: CHEST - 2 VIEW COMPARISON:  December 11, 2023 FINDINGS: No focal airspace  consolidation, pleural effusion, or pneumothorax. No cardiomegaly. No acute fracture or destructive lesion. IMPRESSION: No acute cardiopulmonary abnormality. Electronically Signed   By: Rogelia Myers M.D.   On: 12/15/2023 20:55     Procedures   Medications Ordered in the ED  lidocaine  (XYLOCAINE ) 2 % viscous mouth solution 15 mL (has no administration in time range)  alum & mag hydroxide-simeth (MAALOX/MYLANTA) 200-200-20 MG/5ML suspension 15 mL (has no administration in time range)  promethazine  (PHENERGAN ) 12.5 mg in sodium chloride  0.9 % 50 mL IVPB (has no administration in time range)  lactated ringers  bolus 500 mL (0 mLs Intravenous Stopped 12/15/23 2207)  LORazepam  (ATIVAN ) tablet 1 mg (1 mg Sublingual Given 12/15/23 2113)  morphine  (PF) 4  MG/ML injection 4 mg (4 mg Intravenous Given 12/15/23 2237)    Clinical Course as of 12/15/23 2359  Fri Dec 15, 2023  2359 EKG reviewed by me without evidence of acute ischemic changes and similar to prior EKG performed on 12/11/2023 [AG]    Clinical Course User Index [AG] Nada Chroman, DO                                 Medical Decision Making Amount and/or Complexity of Data Reviewed Labs: ordered. Radiology: ordered.  Risk OTC drugs. Prescription drug management.   On initial evaluation patient is tachycardic, normotensive, afebrile and not in acute distress during examination.  With patient's history of chronic abdominal pain, chronic pancreatitis and chest pain will obtain laboratory studies as well as chest x-ray.  With tachycardia in the setting of nausea and vomiting will give fluid bolus, sublingual Ativan  as well as morphine .  Lipase within normal limits therefore do not believe patient has acute exacerbation of pancreatitis at this time.  Patient's abdominal pain improved with multimodal pain control, antiemetics and patient's tachycardia improved after fluid bolus.  Laboratory studies without evidence of significant anemia,  leukocytosis, electrolyte abnormality or renal impairment.  Patient has no evidence of acute ischemia, troponin within normal limits and chest pain resolved after multimodal pain control and antiemetics therefore ACS/MI less likely.  Long discussion held with patient regarding abstinence from substance and alcohol  use.  Patient requested outpatient resources at discharge which were printed and given to the patient.  Patient will be discharged with Maalox.  Patient advised to create outpatient appointments with gastroenterology, cardiology and primary care doctor.  At the time of discharge patient agreed with medicine plan of care and had no further questions.     Final diagnoses:  Chronic abdominal pain  Alcohol  use  Substance use    ED Discharge Orders          Ordered    alum & mag hydroxide-simeth (MAALOX MAX) 400-400-40 MG/5ML suspension  Every 6 hours PRN,   Status:  Discontinued        12/15/23 2318    alum & mag hydroxide-simeth (MAALOX MAX) 400-400-40 MG/5ML suspension  Every 6 hours PRN        12/15/23 2320            Chroman Nada DO Emergency Medicine PGY2    Nada Chroman, DO 12/15/23 2351    Cottie Donnice PARAS, MD 12/16/23 1436

## 2023-12-16 ENCOUNTER — Encounter (HOSPITAL_COMMUNITY): Payer: Self-pay

## 2023-12-16 NOTE — ED Notes (Signed)
 PT D/C'D AFTER INSTRUCTIONS REVIEWED. PT VERBALIZED UNDERSTANDING. NAD REPORTED OR NOTED AT THIS TIME.

## 2023-12-18 ENCOUNTER — Ambulatory Visit: Payer: Self-pay | Admitting: Cardiology

## 2023-12-20 ENCOUNTER — Emergency Department (HOSPITAL_COMMUNITY): Payer: MEDICAID

## 2023-12-20 ENCOUNTER — Other Ambulatory Visit: Payer: Self-pay

## 2023-12-20 ENCOUNTER — Emergency Department (HOSPITAL_COMMUNITY)
Admission: EM | Admit: 2023-12-20 | Discharge: 2023-12-21 | Disposition: A | Discharge status: Home / Self Care | Payer: MEDICAID | Attending: Emergency Medicine | Admitting: Emergency Medicine

## 2023-12-20 DIAGNOSIS — Z91040 Latex allergy status: Secondary | ICD-10-CM | POA: Insufficient documentation

## 2023-12-20 DIAGNOSIS — R1084 Generalized abdominal pain: Secondary | ICD-10-CM | POA: Diagnosis not present

## 2023-12-20 DIAGNOSIS — R079 Chest pain, unspecified: Secondary | ICD-10-CM | POA: Insufficient documentation

## 2023-12-20 DIAGNOSIS — J45909 Unspecified asthma, uncomplicated: Secondary | ICD-10-CM | POA: Insufficient documentation

## 2023-12-20 DIAGNOSIS — I10 Essential (primary) hypertension: Secondary | ICD-10-CM | POA: Insufficient documentation

## 2023-12-20 DIAGNOSIS — R109 Unspecified abdominal pain: Secondary | ICD-10-CM | POA: Diagnosis present

## 2023-12-20 LAB — COMPREHENSIVE METABOLIC PANEL WITH GFR
ALT: 38 U/L (ref 0–44)
AST: 46 U/L — ABNORMAL HIGH (ref 15–41)
Albumin: 3.7 g/dL (ref 3.5–5.0)
Alkaline Phosphatase: 68 U/L (ref 38–126)
Anion gap: 11 (ref 5–15)
BUN: 7 mg/dL (ref 6–20)
CO2: 12 mmol/L — ABNORMAL LOW (ref 22–32)
Calcium: 7.8 mg/dL — ABNORMAL LOW (ref 8.9–10.3)
Chloride: 116 mmol/L — ABNORMAL HIGH (ref 98–111)
Creatinine, Ser: 0.85 mg/dL (ref 0.61–1.24)
GFR, Estimated: >60 mL/min (ref >60)
Glucose, Bld: 133 mg/dL — ABNORMAL HIGH (ref 70–99)
Potassium: 4 mmol/L (ref 3.5–5.1)
Sodium: 139 mmol/L (ref 135–145)
Total Bilirubin: 0.7 mg/dL (ref 0.0–1.2)
Total Protein: 7 g/dL (ref 6.5–8.1)

## 2023-12-20 LAB — CBC
HCT: 34.2 % — ABNORMAL LOW (ref 39.0–52.0)
Hemoglobin: 11.1 g/dL — ABNORMAL LOW (ref 13.0–17.0)
MCH: 29.8 pg (ref 26.0–34.0)
MCHC: 32.5 g/dL (ref 30.0–36.0)
MCV: 91.9 fL (ref 80.0–100.0)
Platelets: 181 K/uL (ref 150–400)
RBC: 3.72 MIL/uL — ABNORMAL LOW (ref 4.22–5.81)
RDW: 16.4 % — ABNORMAL HIGH (ref 11.5–15.5)
WBC: 6.6 K/uL (ref 4.0–10.5)
nRBC: 0 % (ref 0.0–0.2)

## 2023-12-20 LAB — LIPASE, BLOOD: Lipase: 32 U/L (ref 11–51)

## 2023-12-20 LAB — TYPE AND SCREEN
ABO/RH(D): B POS
Antibody Screen: NEGATIVE

## 2023-12-20 LAB — TROPONIN I (HIGH SENSITIVITY)
Troponin I (High Sensitivity): 4 ng/L (ref <18)
Troponin I (High Sensitivity): 4 ng/L (ref <18)

## 2023-12-20 MED ORDER — ONDANSETRON 4 MG PO TBDP
4.0000 mg | ORAL_TABLET | Freq: Once | ORAL | Status: AC
  Administered 2023-12-20 (×2): 4 mg via ORAL
  Filled 2023-12-20: qty 1

## 2023-12-20 NOTE — ED Provider Triage Note (Signed)
 Emergency Medicine Provider Triage Evaluation Note  [name] , a 53 y.o. male  was evaluated in triage.  Pt complains of nausea, vomiting, rectal bleeding, chest pain, abdominal pain.  History of chronic pancreatitis, frequent emergency department visits.  Review of Systems  Positive: Abdominal pain, chest pain, NV, rectal bleeding Negative:   Physical Exam  BP (!) 146/100 (BP Location: Right Arm)   Pulse 70   Temp 98.8 F (37.1 C) (Oral)   Resp 16   Ht 5' 8 (1.727 m)   Wt 56 kg   SpO2 100%   BMI 18.77 kg/m  Gen:   Awake, no distress   Resp:  Normal effort  MSK:   Moves extremities without difficulty  Other:    Medical Decision Making  Medically screening exam initiated at 5:28 PM.  Appropriate orders placed.  [name] was informed that the remainder of the evaluation will be completed by another provider, this initial triage assessment does not replace that evaluation, and the importance of remaining in the ED until their evaluation is complete.  Workup initiated in triage    [name], [place] 12/20/23 1728

## 2023-12-20 NOTE — ED Triage Notes (Signed)
 Patient bib EMS with complaints of abdominal pain for 45 minutes that has moved up into his chest about 30 minutes ago. Also blood in the stool since yesterday.

## 2023-12-20 NOTE — ED Triage Notes (Signed)
 EMS vital signs  140/90 98% RA  90 P

## 2023-12-20 NOTE — ED Notes (Signed)
 Pt stepping outside to make phone call

## 2023-12-21 MED ORDER — PANTOPRAZOLE SODIUM 40 MG PO TBEC: 40.0000 mg | DELAYED_RELEASE_TABLET | Freq: Every day | ORAL | 0 refills | Status: DC

## 2023-12-21 MED ORDER — ALUM & MAG HYDROXIDE-SIMETH 200-200-20 MG/5ML PO SUSP
30.0000 mL | Freq: Once | ORAL | Status: AC
  Administered 2023-12-21: 30 mL via ORAL
  Filled 2023-12-21: qty 30

## 2023-12-21 MED ORDER — FAMOTIDINE IN NACL 20-0.9 MG/50ML-% IV SOLN
20.0000 mg | Freq: Once | INTRAVENOUS | Status: AC
  Administered 2023-12-21: 20 mg via INTRAVENOUS
  Filled 2023-12-21: qty 50

## 2023-12-21 MED ORDER — MORPHINE SULFATE (PF) 4 MG/ML IV SOLN: 4.0000 mg | Freq: Once | INTRAVENOUS | Status: DC

## 2023-12-21 MED ORDER — MORPHINE SULFATE (PF) 4 MG/ML IV SOLN
4.0000 mg | Freq: Once | INTRAVENOUS | Status: AC
  Administered 2023-12-21: 4 mg via INTRAVENOUS
  Filled 2023-12-21 (×2): qty 1

## 2023-12-21 MED ORDER — SODIUM CHLORIDE 0.9 % IV SOLN
12.5000 mg | Freq: Four times a day (QID) | INTRAVENOUS | Status: DC | PRN
  Administered 2023-12-21: 12.5 mg via INTRAVENOUS
  Filled 2023-12-21: qty 12.5

## 2023-12-21 MED ORDER — SUCRALFATE 1 G PO TABS: 1.0000 g | ORAL_TABLET | Freq: Three times a day (TID) | ORAL | 0 refills | Status: DC

## 2023-12-21 MED ORDER — SODIUM CHLORIDE 0.9 % IV BOLUS
500.0000 mL | Freq: Once | INTRAVENOUS | Status: AC
  Administered 2023-12-21: 500 mL via INTRAVENOUS

## 2023-12-21 MED ORDER — OXYCODONE-ACETAMINOPHEN 5-325 MG PO TABS: 1.0000 | ORAL_TABLET | ORAL | 0 refills | Status: DC | PRN

## 2023-12-21 MED ORDER — OXYCODONE-ACETAMINOPHEN 5-325 MG PO TABS
1.0000 | ORAL_TABLET | Freq: Once | ORAL | Status: AC
  Administered 2023-12-21: 1 via ORAL
  Filled 2023-12-21: qty 1

## 2023-12-21 MED ORDER — LIDOCAINE VISCOUS HCL 2 % MT SOLN
15.0000 mL | Freq: Once | OROMUCOSAL | Status: AC
Start: 2023-12-21 — End: 2023-12-21
  Administered 2023-12-21: 15 mL via OROMUCOSAL
  Filled 2023-12-21: qty 15

## 2023-12-21 NOTE — ED Provider Notes (Signed)
 [name] EMERGENCY DEPARTMENT AT [facility] Provider Note   CSN: [id number] Arrival date & time: 12/20/23  1657     Patient presents with: Chest Pain and Rectal Bleeding   [name] is a 53 y.o. male with history of chronic pancreatitis, alcohol  abuse, GERD, hypertension, WPW presents with complaints of abdominal pain that radiates across his chest.  States he has had the symptoms over the past day.  Additionally noted bright bloody stools yesterday.    Chest Pain Rectal Bleeding     Past Medical History:  Diagnosis Date   Alcoholism /alcohol  abuse    Anemia    Anxiety    Arthritis    knees; arms; elbows (03/26/2015)   Asthma    Bipolar disorder (HCC)    Chronic bronchitis (HCC)    Chronic lower back pain    Chronic pancreatitis (HCC)    Cocaine abuse (HCC)    Depression    Family history of adverse reaction to anesthesia    Femoral condyle fracture (HCC) 03/08/2014   left medial/notes 03/09/2014   GERD (gastroesophageal reflux disease)    H/O hiatal hernia    H/O suicide attempt 10/2012   High cholesterol    History of blood transfusion 10/2012   when I tried to commit suicide   History of stomach ulcers    Hypertension    Marijuana abuse, continuous    Migraine    a few times/year (03/26/2015)   Pancreatitis    Pneumonia 1990's X 3   PTSD (post-traumatic stress disorder)    Seizures (HCC)    Sickle cell trait (HCC)    WPW (Wolff-Parkinson-White syndrome)    thelbert 03/06/2013   Past Surgical History:  Procedure Laterality Date   BIOPSY  11/25/2017   Procedure: BIOPSY;  Surgeon: Burnette Fallow, MD;  Location: [facility] ENDOSCOPY;  Service: Endoscopy;;   BIOPSY  10/14/2018   Procedure: BIOPSY;  Surgeon: Burnette Fallow, MD;  Location: [facility] ENDOSCOPY;  Service: Endoscopy;;   BIOPSY  03/06/2023   Procedure: BIOPSY;  Surgeon: Wilhelmenia Aloha Raddle., MD;  Location: [facility];  Service: Gastroenterology;;   CARDIAC CATHETERIZATION     CYST  ENTEROSTOMY  01/02/2020   Procedure: CYST ASPIRATION;  Surgeon: Teressa Toribio SQUIBB, MD;  Location: [facility] ENDOSCOPY;  Service: Endoscopy;;   ESOPHAGOGASTRODUODENOSCOPY N/A 03/06/2023   Procedure: ESOPHAGOGASTRODUODENOSCOPY (EGD);  Surgeon: Wilhelmenia Aloha Raddle., MD;  Location: [facility];  Service: Gastroenterology;  Laterality: N/A;   ESOPHAGOGASTRODUODENOSCOPY (EGD) WITH PROPOFOL  N/A 11/25/2017   Procedure: ESOPHAGOGASTRODUODENOSCOPY (EGD) WITH PROPOFOL ;  Surgeon: Burnette Fallow, MD;  Location: [facility] ENDOSCOPY;  Service: Endoscopy;  Laterality: N/A;   ESOPHAGOGASTRODUODENOSCOPY (EGD) WITH PROPOFOL  Left 10/14/2018   Procedure: ESOPHAGOGASTRODUODENOSCOPY (EGD) WITH PROPOFOL ;  Surgeon: Burnette Fallow, MD;  Location: [facility] ENDOSCOPY;  Service: Endoscopy;  Laterality: Left;   ESOPHAGOGASTRODUODENOSCOPY (EGD) WITH PROPOFOL  N/A 11/14/2018   Procedure: ESOPHAGOGASTRODUODENOSCOPY (EGD) WITH PROPOFOL ;  Surgeon: Celestia Agent, MD;  Location: [facility] ENDOSCOPY;  Service: Gastroenterology;  Laterality: N/A;   ESOPHAGOGASTRODUODENOSCOPY (EGD) WITH PROPOFOL  N/A 01/02/2020   Procedure: ESOPHAGOGASTRODUODENOSCOPY (EGD) WITH PROPOFOL ;  Surgeon: Teressa Toribio SQUIBB, MD;  Location: [facility] ENDOSCOPY;  Service: Endoscopy;  Laterality: N/A;   ESOPHAGOGASTRODUODENOSCOPY (EGD) WITH PROPOFOL  N/A 10/25/2020   Procedure: ESOPHAGOGASTRODUODENOSCOPY (EGD) WITH PROPOFOL ;  Surgeon: Wilhelmenia Aloha Raddle., MD;  Location: [facility] ENDOSCOPY;  Service: Gastroenterology;  Laterality: N/A;   EUS N/A 01/02/2020   Procedure: UPPER ENDOSCOPIC ULTRASOUND (EUS) RADIAL;  Surgeon: Teressa Toribio SQUIBB, MD;  Location: [facility] ENDOSCOPY;  Service: Endoscopy;  Laterality: N/A;  EUS N/A 03/06/2023   Procedure: UPPER ENDOSCOPIC ULTRASOUND (EUS) RADIAL;  Surgeon: Wilhelmenia Aloha Raddle., MD;  Location: [facility] ENDOSCOPY;  Service: Gastroenterology;  Laterality: N/A;   EYE SURGERY Left 1990's   result of trauma    FACIAL FRACTURE SURGERY Left 1990's   result of trauma    FINE  NEEDLE ASPIRATION N/A 03/06/2023   Procedure: FINE NEEDLE ASPIRATION (FNA) LINEAR;  Surgeon: Wilhelmenia Aloha Raddle., MD;  Location: [facility] ENDOSCOPY;  Service: Gastroenterology;  Laterality: N/A;   FLEXIBLE SIGMOIDOSCOPY N/A 10/25/2020   Procedure: FLEXIBLE SIGMOIDOSCOPY;  Surgeon: Wilhelmenia Aloha Raddle., MD;  Location: [facility]COPY;  Service: Gastroenterology;  Laterality: N/A;   FRACTURE SURGERY     HEMOSTASIS CLIP PLACEMENT  10/25/2020   Procedure: HEMOSTASIS CLIP PLACEMENT;  Surgeon: Wilhelmenia Aloha Raddle., MD;  Location: [facility] ENDOSCOPY;  Service: Gastroenterology;;   HERNIA REPAIR     HOT HEMOSTASIS N/A 03/06/2023   Procedure: HOT HEMOSTASIS (ARGON PLASMA COAGULATION/BICAP);  Surgeon: Wilhelmenia Aloha Raddle., MD;  Location: [facility];  Service: Gastroenterology;  Laterality: N/A;   LEFT HEART CATHETERIZATION WITH CORONARY ANGIOGRAM Right 03/07/2013   Procedure: LEFT HEART CATHETERIZATION WITH CORONARY ANGIOGRAM;  Surgeon: Salena GORMAN Negri, MD;  Location: [facility] CATH LAB;  Service: Cardiovascular;  Laterality: Right;   UMBILICAL HERNIA REPAIR     UPPER GASTROINTESTINAL ENDOSCOPY       Prior to Admission medications   Medication Sig Start Date End Date Taking? Authorizing Provider  acetaminophen  (TYLENOL ) 500 MG tablet Take 1,000 mg by mouth daily as needed for mild pain (pain score 1-3) or moderate pain (pain score 4-6).    [provider]  alum & mag hydroxide-simeth (MAALOX MAX) 400-400-40 MG/5ML suspension Take 5 mLs by mouth every 6 (six) hours as needed for up to 7 days for indigestion. 12/15/23 12/22/23  Nada Chroman, DO  carvedilol  (COREG ) 25 MG tablet Take 1 tablet (25 mg total) by mouth 2 (two) times daily with a meal. 12/08/23   West, Katlyn D, NP  famotidine  (PEPCID ) 40 MG tablet Take 1 tablet (40 mg total) by mouth daily. Patient taking differently: Take 40 mg by mouth 2 (two) times daily. 10/17/23   Henderly, Britni A, PA-C  folic acid  (FOLVITE ) 1 MG tablet Take 1 tablet (1 mg  total) by mouth daily. Patient taking differently: Take 1 mg by mouth 2 (two) times daily. 11/23/23 12/23/23  Vernon Ranks, MD  lidocaine  (XYLOCAINE ) 2 % solution Use as directed 15 mLs in the mouth or throat every 6 (six) hours as needed for mouth pain. 12/02/23   Charlyn Sora, MD  lipase/protease/amylase (CREON ) 36000 UNITS CPEP capsule Take 2 capsules (72,000 Units total) by mouth 3 (three) times daily with meals. May also take 1 capsule (36,000 Units total) as needed (with snacks). 08/21/23   Ghimire, Donalda HERO, MD  loperamide  (IMODIUM  A-D) 2 MG tablet Take 2 mg by mouth 4 (four) times daily as needed for diarrhea or loose stools.    [provider]  magnesium  oxide (MAG-OX) 400 (240 Mg) MG tablet Take 1 tablet (400 mg total) by mouth daily. 11/07/23   Hoy Nidia FALCON, PA-C  Multiple Vitamin (MULTIVITAMIN WITH MINERALS) TABS tablet Take 1 tablet by mouth daily. Patient taking differently: Take 2 tablets by mouth daily. 08/21/23   Ghimire, Donalda HERO, MD  nicotine  (NICODERM CQ  - DOSED IN MG/24 HOURS) 21 mg/24hr patch Place 1 patch (21 mg total) onto the skin daily. 11/14/23   Amin, Ankit C, MD  ondansetron  (ZOFRAN -ODT) 4 MG  disintegrating tablet Take 1 tablet (4 mg total) by mouth every 8 (eight) hours as needed. 11/23/23   Pahwani, Ravi, MD  oxyCODONE -acetaminophen  (PERCOCET) 5-325 MG tablet Take 1 tablet by mouth every 4 (four) hours as needed. 12/21/23   Donnajean Lynwood DEL, PA-C  pantoprazole  (PROTONIX ) 40 MG tablet Take 1 tablet (40 mg total) by mouth daily. 12/21/23   Donnajean Lynwood DEL, PA-C  polyethylene glycol powder (GLYCOLAX /MIRALAX ) 17 GM/SCOOP powder Take 17 g by mouth daily as needed for mild constipation. 11/13/23   Amin, Ankit C, MD  sucralfate  (CARAFATE ) 1 g tablet Take 1 tablet (1 g total) by mouth 3 (three) times daily with meals. 12/21/23   Donnajean Lynwood DEL, PA-C  tamsulosin  (FLOMAX ) 0.4 MG CAPS capsule Take 1 capsule (0.4 mg total) by mouth daily. Patient not taking:  Reported on 12/08/2023 08/21/23   Raenelle Donalda HERO, MD  amitriptyline  (ELAVIL ) 25 MG tablet Take 1 tablet (25 mg total) by mouth at bedtime. Patient not taking: Reported on 08/08/2019 10/15/18 08/08/19  Tobie Yetta HERO, MD    Allergies: Robaxin  [methocarbamol ], Bayer aspirin  [aspirin ], Trazodone  and nefazodone, Adhesive [tape], Latex, Toradol  [ketorolac  tromethamine ], Contrast media [iodinated contrast media], Fish allergy, Reglan  [metoclopramide ], Garnell gums oil], and Shellfish allergy    Review of Systems  Cardiovascular:  Positive for chest pain.  Gastrointestinal:  Positive for hematochezia.    Updated Vital Signs BP (!) 156/98 (BP Location: Right Arm)   Pulse 91   Temp 98 F (36.7 C) (Oral)   Resp 16   Ht 5' 8 (1.727 m)   Wt 56 kg   SpO2 100%   BMI 18.77 kg/m   Physical Exam Vitals and nursing note reviewed.  Constitutional:      General: He is not in acute distress.    Appearance: He is well-developed.  HENT:     Head: Normocephalic and atraumatic.  Eyes:     Conjunctiva/sclera: Conjunctivae normal.  Cardiovascular:     Rate and Rhythm: Normal rate and regular rhythm.     Heart sounds: No murmur heard. Pulmonary:     Effort: Pulmonary effort is normal. No respiratory distress.     Breath sounds: Normal breath sounds.  Abdominal:     Palpations: Abdomen is soft.     Tenderness: There is abdominal tenderness.     Comments: Mild generalized abdominal tenderness  Musculoskeletal:        General: No swelling.     Cervical back: Neck supple.  Skin:    General: Skin is warm and dry.     Capillary Refill: Capillary refill takes less than 2 seconds.  Neurological:     Mental Status: He is alert.  Psychiatric:        Mood and Affect: Mood normal.     (all labs ordered are listed, but only abnormal results are displayed) Labs Reviewed  COMPREHENSIVE METABOLIC PANEL WITH GFR - Abnormal; Notable for the following components:      Result Value   Chloride 116 (*)     CO2 12 (*)    Glucose, Bld 133 (*)    Calcium  7.8 (*)    AST 46 (*)    All other components within normal limits  CBC - Abnormal; Notable for the following components:   RBC 3.72 (*)    Hemoglobin 11.1 (*)    HCT 34.2 (*)    RDW 16.4 (*)    All other components within normal limits  LIPASE, BLOOD  URINALYSIS, ROUTINE W REFLEX MICROSCOPIC  TYPE AND SCREEN  TROPONIN I (HIGH SENSITIVITY)  TROPONIN I (HIGH SENSITIVITY)    EKG: EKG Interpretation Date/Time:  Thursday December 21 2023 03:14:23 EDT Ventricular Rate:  81 PR Interval:  108 QRS Duration:  78 QT Interval:  334 QTC Calculation: 387 R Axis:   68  Text Interpretation: Sinus rhythm with marked sinus arrhythmia with short PR Right atrial enlargement T wave abnormality, consider inferolateral ischemia Abnormal ECG When compared with ECG of 15-Dec-2023 20:12, PREVIOUS ECG IS PRESENT Confirmed by Darra Chew [phone]) on 12/21/2023 3:17:19 AM  Radiology: [facility] Chest 1 View Result Date: 12/20/2023 CLINICAL DATA:  Chest pain EXAM: CHEST  1 VIEW COMPARISON:  Chest x-ray 12/15/2023 FINDINGS: The heart size and mediastinal contours are within normal limits. Both lungs are clear. The visualized skeletal structures are unremarkable. IMPRESSION: No active disease. Electronically Signed   By: Greig Pique M.D.   On: 12/20/2023 20:13     Procedures   Medications Ordered in the ED  promethazine  (PHENERGAN ) 12.5 mg in sodium chloride  0.9 % 50 mL IVPB (12.5 mg Intravenous New Bag/Given 12/21/23 0414)  famotidine  (PEPCID ) IVPB 20 mg premix (20 mg Intravenous New Bag/Given 12/21/23 0415)  ondansetron  (ZOFRAN -ODT) disintegrating tablet 4 mg (4 mg Oral Given 12/20/23 1742)  alum & mag hydroxide-simeth (MAALOX/MYLANTA) 200-200-20 MG/5ML suspension 30 mL (30 mLs Oral Given 12/21/23 0302)  sodium chloride  0.9 % bolus 500 mL (500 mLs Intravenous New Bag/Given 12/21/23 0413)  morphine  (PF) 4 MG/ML injection 4 mg (4 mg Intravenous Given 12/21/23 0411)   lidocaine  (XYLOCAINE ) 2 % viscous mouth solution 15 mL (15 mLs Mouth/Throat Given 12/21/23 0413)                                    Medical Decision Making Amount and/or Complexity of Data Reviewed Labs: ordered.  Risk OTC drugs. Prescription drug management.   This patient presents to the ED with chief complaint(s) of abdominal pain .  The complaint involves an extensive differential diagnosis and also carries with it a high risk of complications and morbidity.   Pertinent past medical history as listed in HPI  The differential diagnosis includes  ACS, PE, aortic dissection cholecystitis, pancreatitis, Additional history obtained: Records reviewed Care Everywhere/External Records  Assessment and management:   Hemodynamically stable, nontoxic-appearing patient presenting with complaints of abdominal pain that radiates to his chest.  Is associated with nausea, vomiting and diarrhea.  Additionally notes that he has had a couple stools with bright blood.  Has a history of PVD.  He is not on blood thinners.  Reports compliance with pantoprazole .  On exam he has generalized abdominal tenderness.  Patient does have a history of chronic pancreatitis, however his lipase is within normal limits.  He is well-known to this facility and appears to have been seen multiple times for similar complaints over the past week.  This EKG is without any changes, troponin without elevation.  Do not suspect ACS or PE.  He is sitting comfortably in no acute distress with symmetric pulses.  Do not suspect dissection.  He has additionally had numerous CT imaging studies without any evidence of aneurysm.  His hemoglobin is stable and has in fact increased since he was last here 6 days ago.  He declines Hemoccult test today.  He has no leukocytosis or transaminitis.  Doubt biliary etiology.  Patient does not meet admission criteria.  Analgesia, antiemetics and PPI provided here  in the ED. tolerating p.o.  Will discharge  home with strict return precautions and GI follow-up.  Independent ECG interpretation:  Sinus ryhthm, t wave abnormalities unchanged  Independent labs interpretation:  The following labs were independently interpreted:  Troponin without elevation, CMP with hypocalcemia of 7.8, CBC without leukocytosis, hemoglobin stable, lipase within normal limits  Independent visualization and interpretation of imaging: I independently visualized the following imaging with scope of interpretation limited to determining acute life threatening conditions related to emergency care:   CXR no active disease   Consultations obtained:   none  Disposition:   Patient will be discharged home. The patient has been appropriately medically screened and/or stabilized in the ED. I have low suspicion for any other emergent medical condition which would require further screening, evaluation or treatment in the ED or require inpatient management. At time of discharge the patient is hemodynamically stable and in no acute distress. I have discussed work-up results and diagnosis with patient and answered all questions. Patient is agreeable with discharge plan. We discussed strict return precautions for returning to the emergency department and they verbalized understanding.     Social Determinants of Health:   none  This note was dictated with voice recognition software.  Despite best efforts at proofreading, errors may have occurred which can change the documentation meaning.       Final diagnoses:  Generalized abdominal pain    ED Discharge Orders          Ordered    sucralfate  (CARAFATE ) 1 g tablet  3 times daily with meals        12/21/23 0442    pantoprazole  (PROTONIX ) 40 MG tablet  Daily        12/21/23 0442    oxyCODONE -acetaminophen  (PERCOCET) 5-325 MG tablet  Every 4 hours PRN        12/21/23 0442               Donnajean Lynwood DEL, PA-C 12/21/23 0444    Long, Joshua G, MD 12/21/23  [phone]

## 2023-12-21 NOTE — Discharge Instructions (Addendum)
 You were evaluated in the emergency room for abdominal pain, chest pain and rectal bleeding.  Your lab work did not show any significant abnormality.  Please follow-up with your GI doctor for further evaluation.

## 2023-12-28 ENCOUNTER — Telehealth: Payer: Self-pay | Admitting: Cardiology

## 2023-12-28 NOTE — Telephone Encounter (Signed)
 Patient c/o Palpitations:  STAT if patient reporting lightheadedness, shortness of breath, or chest pain  How long have you had palpitations/irregular HR/ Afib? Are you having the symptoms now? Palpitation started last night   Are you currently experiencing lightheadedness, SOB or CP? CP and sob but more so with exertion  Do you have a history of afib (atrial fibrillation) or irregular heart rhythm? Yes   Have you checked your BP or HR? (document readings if available): 139/94 hr 100 - currently   Are you experiencing any other symptoms? Dizzy

## 2023-12-28 NOTE — Telephone Encounter (Signed)
 Left voice message to call back 8/21

## 2023-12-28 NOTE — Telephone Encounter (Signed)
 Call came straight to triage. Patient having SOB Chest Pain and palpitations with activity. Patient sounds like he is anxious and he is having hard to catching his breath. Asked patient about nitroglycerin , patient stated he took a cialis pill last night. Informed patient that he cannot take a nitroglycerin  for at least 3 days. Encouraged patient to call 911 due to the SOB and chest pain. Patient requested his monitor results as well while on the phone. Informed patient of his monitor results, and let him know that he needs to seek medical attention right away. Patient verbalized understanding. Will send message to Dr. Lavona for further advisement.

## 2023-12-29 ENCOUNTER — Encounter (HOSPITAL_COMMUNITY): Payer: Self-pay

## 2023-12-29 ENCOUNTER — Emergency Department (HOSPITAL_COMMUNITY): Payer: MEDICAID

## 2023-12-29 ENCOUNTER — Other Ambulatory Visit: Payer: Self-pay

## 2023-12-29 ENCOUNTER — Emergency Department (HOSPITAL_COMMUNITY)
Admission: EM | Admit: 2023-12-29 | Discharge: 2023-12-29 | Disposition: A | Discharge status: Home / Self Care | Payer: MEDICAID | Attending: Emergency Medicine | Admitting: Emergency Medicine

## 2023-12-29 DIAGNOSIS — I10 Essential (primary) hypertension: Secondary | ICD-10-CM | POA: Diagnosis not present

## 2023-12-29 DIAGNOSIS — R079 Chest pain, unspecified: Secondary | ICD-10-CM | POA: Diagnosis not present

## 2023-12-29 DIAGNOSIS — Z79899 Other long term (current) drug therapy: Secondary | ICD-10-CM | POA: Diagnosis not present

## 2023-12-29 DIAGNOSIS — F17200 Nicotine dependence, unspecified, uncomplicated: Secondary | ICD-10-CM | POA: Diagnosis not present

## 2023-12-29 DIAGNOSIS — K860 Alcohol-induced chronic pancreatitis: Secondary | ICD-10-CM | POA: Insufficient documentation

## 2023-12-29 DIAGNOSIS — Z91040 Latex allergy status: Secondary | ICD-10-CM | POA: Insufficient documentation

## 2023-12-29 DIAGNOSIS — R109 Unspecified abdominal pain: Secondary | ICD-10-CM | POA: Diagnosis present

## 2023-12-29 LAB — COMPREHENSIVE METABOLIC PANEL WITH GFR
ALT: 40 U/L (ref 0–44)
AST: 65 U/L — ABNORMAL HIGH (ref 15–41)
Albumin: 4.3 g/dL (ref 3.5–5.0)
Alkaline Phosphatase: 75 U/L (ref 38–126)
Anion gap: 12 (ref 5–15)
BUN: 11 mg/dL (ref 6–20)
CO2: 16 mmol/L — ABNORMAL LOW (ref 22–32)
Calcium: 8.2 mg/dL — ABNORMAL LOW (ref 8.9–10.3)
Chloride: 109 mmol/L (ref 98–111)
Creatinine, Ser: 0.92 mg/dL (ref 0.61–1.24)
GFR, Estimated: >60 mL/min (ref >60)
Glucose, Bld: 93 mg/dL (ref 70–99)
Potassium: 4.1 mmol/L (ref 3.5–5.1)
Sodium: 137 mmol/L (ref 135–145)
Total Bilirubin: 0.6 mg/dL (ref 0.0–1.2)
Total Protein: 7.9 g/dL (ref 6.5–8.1)

## 2023-12-29 LAB — CBC
HCT: 38.1 % — ABNORMAL LOW (ref 39.0–52.0)
Hemoglobin: 12.3 g/dL — ABNORMAL LOW (ref 13.0–17.0)
MCH: 29.4 pg (ref 26.0–34.0)
MCHC: 32.3 g/dL (ref 30.0–36.0)
MCV: 90.9 fL (ref 80.0–100.0)
Platelets: 203 K/uL (ref 150–400)
RBC: 4.19 MIL/uL — ABNORMAL LOW (ref 4.22–5.81)
RDW: 16.2 % — ABNORMAL HIGH (ref 11.5–15.5)
WBC: 5.6 K/uL (ref 4.0–10.5)
nRBC: 0 % (ref 0.0–0.2)

## 2023-12-29 LAB — TROPONIN I (HIGH SENSITIVITY)
Troponin I (High Sensitivity): 6 ng/L (ref <18)
Troponin I (High Sensitivity): 5 ng/L (ref <18)

## 2023-12-29 LAB — LIPASE, BLOOD: Lipase: 29 U/L (ref 11–51)

## 2023-12-29 MED ORDER — LIDOCAINE VISCOUS HCL 2 % MT SOLN
15.0000 mL | Freq: Once | OROMUCOSAL | Status: AC
  Administered 2023-12-29: 15 mL via ORAL
  Filled 2023-12-29: qty 15

## 2023-12-29 MED ORDER — MORPHINE SULFATE (PF) 4 MG/ML IV SOLN
4.0000 mg | Freq: Once | INTRAVENOUS | Status: AC
  Administered 2023-12-29: 4 mg via INTRAVENOUS
  Filled 2023-12-29: qty 1

## 2023-12-29 MED ORDER — PANTOPRAZOLE SODIUM 40 MG IV SOLR
40.0000 mg | Freq: Once | INTRAVENOUS | Status: AC
  Administered 2023-12-29: 40 mg via INTRAVENOUS
  Filled 2023-12-29: qty 10

## 2023-12-29 MED ORDER — OXYCODONE HCL 5 MG PO TABS
10.0000 mg | ORAL_TABLET | Freq: Once | ORAL | Status: AC
  Administered 2023-12-29: 10 mg via ORAL
  Filled 2023-12-29: qty 2

## 2023-12-29 MED ORDER — SUCRALFATE 1 G PO TABS: 1.0000 g | ORAL_TABLET | Freq: Three times a day (TID) | ORAL | 0 refills | Status: DC

## 2023-12-29 MED ORDER — ALUM & MAG HYDROXIDE-SIMETH 200-200-20 MG/5ML PO SUSP
30.0000 mL | Freq: Once | ORAL | Status: AC
  Administered 2023-12-29: 30 mL via ORAL
  Filled 2023-12-29: qty 30

## 2023-12-29 MED ORDER — SODIUM CHLORIDE 0.9 % IV SOLN
12.5000 mg | Freq: Four times a day (QID) | INTRAVENOUS | Status: DC | PRN
  Administered 2023-12-29: 12.5 mg via INTRAVENOUS
  Filled 2023-12-29: qty 0.5

## 2023-12-29 MED ORDER — LIDOCAINE VISCOUS HCL 2 % MT SOLN: 15.0000 mL | Freq: Four times a day (QID) | OROMUCOSAL | 0 refills | Status: DC | PRN

## 2023-12-29 MED ORDER — SODIUM CHLORIDE 0.9 % IV BOLUS
1000.0000 mL | Freq: Once | INTRAVENOUS | Status: AC
  Administered 2023-12-29: 1000 mL via INTRAVENOUS

## 2023-12-29 NOTE — Discharge Instructions (Addendum)
 Use your home pain medication as needed, follow-up with your primary care doctor to discuss additional pain medication requirement.  Use your home viscous lidocaine , Carafate , nausea medication.

## 2023-12-29 NOTE — ED Provider Notes (Signed)
 [facility] Provider Note   CSN: [id number] Arrival date & time: 12/29/23  1717     Patient presents with: Chest Pain   [name] is a 53 y.o. male with past medical history significant for alcohol  abuse, tobacco abuse, [employer], polysubstance abuse, chronic pancreatitis, hypertension, nausea, vomiting, diarrhea who presents concern for chest pain, abdominal pain worse with breathing, moving.  No fever, no chills.  Seen frequently for similar.    Chest Pain      Prior to Admission medications   Medication Sig Start Date End Date Taking? Authorizing Provider  acetaminophen  (TYLENOL ) 500 MG tablet Take 1,000 mg by mouth daily as needed for mild pain (pain score 1-3) or moderate pain (pain score 4-6).    [provider]  carvedilol  (COREG ) 25 MG tablet Take 1 tablet (25 mg total) by mouth 2 (two) times daily with a meal. 12/08/23   West, Katlyn D, NP  famotidine  (PEPCID ) 40 MG tablet Take 1 tablet (40 mg total) by mouth daily. Patient taking differently: Take 40 mg by mouth 2 (two) times daily. 10/17/23   Henderly, Britni A, PA-C  lidocaine  (XYLOCAINE ) 2 % solution Use as directed 15 mLs in the mouth or throat every 6 (six) hours as needed for mouth pain. 12/29/23   Robynne Roat H, PA-C  lipase/protease/amylase (CREON ) 36000 UNITS CPEP capsule Take 2 capsules (72,000 Units total) by mouth 3 (three) times daily with meals. May also take 1 capsule (36,000 Units total) as needed (with snacks). 08/21/23   Ghimire, Donalda HERO, MD  loperamide  (IMODIUM  A-D) 2 MG tablet Take 2 mg by mouth 4 (four) times daily as needed for diarrhea or loose stools.    [provider]  magnesium  oxide (MAG-OX) 400 (240 Mg) MG tablet Take 1 tablet (400 mg total) by mouth daily. 11/07/23   Hoy Nidia FALCON, PA-C  Multiple Vitamin (MULTIVITAMIN WITH MINERALS) TABS tablet Take 1 tablet by mouth daily. Patient taking  differently: Take 2 tablets by mouth daily. 08/21/23   Ghimire, Donalda HERO, MD  nicotine  (NICODERM CQ  - DOSED IN MG/24 HOURS) 21 mg/24hr patch Place 1 patch (21 mg total) onto the skin daily. 11/14/23   Amin, Ankit C, MD  ondansetron  (ZOFRAN -ODT) 4 MG disintegrating tablet Take 1 tablet (4 mg total) by mouth every 8 (eight) hours as needed. 11/23/23   Vernon Ranks, MD  oxyCODONE -acetaminophen  (PERCOCET) 5-325 MG tablet Take 1 tablet by mouth every 4 (four) hours as needed. 12/21/23   Donnajean Lynwood DEL, PA-C  pantoprazole  (PROTONIX ) 40 MG tablet Take 1 tablet (40 mg total) by mouth daily. 12/21/23   Donnajean Lynwood DEL, PA-C  polyethylene glycol powder (GLYCOLAX /MIRALAX ) 17 GM/SCOOP powder Take 17 g by mouth daily as needed for mild constipation. 11/13/23   Amin, Ankit C, MD  sucralfate  (CARAFATE ) 1 g tablet Take 1 tablet (1 g total) by mouth 3 (three) times daily with meals. 12/29/23   Jozalyn Baglio H, PA-C  tamsulosin  (FLOMAX ) 0.4 MG CAPS capsule Take 1 capsule (0.4 mg total) by mouth daily. Patient not taking: Reported on 12/08/2023 08/21/23   Raenelle Donalda HERO, MD  amitriptyline  (ELAVIL ) 25 MG tablet Take 1 tablet (25 mg total) by mouth at bedtime. Patient not taking: Reported on 08/08/2019 10/15/18 08/08/19  Tobie Yetta HERO, MD    Allergies: Robaxin  [methocarbamol ], Bayer aspirin  [aspirin ], Trazodone  and nefazodone, Adhesive [tape], Latex, Toradol  [ketorolac  tromethamine ], Contrast media [iodinated contrast media], Fish allergy, Reglan  [metoclopramide ], Salmon [  fish oil], and Shellfish allergy    Review of Systems  Cardiovascular:  Positive for chest pain.  All other systems reviewed and are negative.   Updated Vital Signs BP (!) 180/113   Pulse 80   Temp 98 F (36.7 C) (Oral)   Resp 16   SpO2 100%   Physical Exam Vitals and nursing note reviewed.  Constitutional:      General: He is not in acute distress.    Appearance: Normal appearance.  HENT:     Head: Normocephalic and atraumatic.   Eyes:     General:        Right eye: No discharge.        Left eye: No discharge.  Cardiovascular:     Rate and Rhythm: Normal rate and regular rhythm.     Heart sounds: No murmur heard.    No friction rub. No gallop.  Pulmonary:     Effort: Pulmonary effort is normal.     Breath sounds: Normal breath sounds.  Abdominal:     General: Bowel sounds are normal.     Palpations: Abdomen is soft.     Comments: Focal tenderness to palpation epigastric region, no rebound, rigidity, some guarding, overall similar abdominal exam to patient's baseline.  Skin:    General: Skin is warm and dry.     Capillary Refill: Capillary refill takes less than 2 seconds.  Neurological:     Mental Status: He is alert and oriented to person, place, and time.  Psychiatric:        Mood and Affect: Mood normal.        Behavior: Behavior normal.     (all labs ordered are listed, but only abnormal results are displayed) Labs Reviewed  CBC - Abnormal; Notable for the following components:      Result Value   RBC 4.19 (*)    Hemoglobin 12.3 (*)    HCT 38.1 (*)    RDW 16.2 (*)    All other components within normal limits  COMPREHENSIVE METABOLIC PANEL WITH GFR - Abnormal; Notable for the following components:   CO2 16 (*)    Calcium  8.2 (*)    AST 65 (*)    All other components within normal limits  LIPASE, BLOOD  TROPONIN I (HIGH SENSITIVITY)  TROPONIN I (HIGH SENSITIVITY)    EKG: EKG Interpretation Date/Time:  Friday December 29 2023 17:24:06 EDT Ventricular Rate:  98 PR Interval:  67 QRS Duration:  73 QT Interval:  304 QTC Calculation: 389 R Axis:   68  Text Interpretation: Sinus tachycardia Multiform ventricular premature complexes Short PR interval Biatrial enlargement Abnormal T, consider ischemia, diffuse leads No significant change since last tracing Confirmed by Zackowski, Scott [phone]) on 12/29/2023 5:30:23 PM  Radiology: DG Chest Portable 1 View Result Date: 12/29/2023 CLINICAL  DATA:  Chest pain. EXAM: PORTABLE CHEST 1 VIEW COMPARISON:  12/20/2023. FINDINGS: The heart size and mediastinal contours are unchanged. No focal consolidation, pleural effusion, or pneumothorax. No acute osseous abnormality. IMPRESSION: No acute cardiopulmonary findings. Electronically Signed   By: Harrietta Sherry M.D.   On: 12/29/2023 18:28     Procedures   Medications Ordered in the ED  promethazine  (PHENERGAN ) 12.5 mg in sodium chloride  0.9 % 50 mL IVPB (0 mg Intravenous Stopped 12/29/23 2005)  morphine  (PF) 4 MG/ML injection 4 mg (4 mg Intravenous Given 12/29/23 1805)  pantoprazole  (PROTONIX ) injection 40 mg (40 mg Intravenous Given 12/29/23 1806)  alum & mag hydroxide-simeth (MAALOX/MYLANTA) 200-200-20 MG/5ML  suspension 30 mL (30 mLs Oral Given 12/29/23 1738)    And  lidocaine  (XYLOCAINE ) 2 % viscous mouth solution 15 mL (15 mLs Oral Given 12/29/23 1738)  sodium chloride  0.9 % bolus 1,000 mL (0 mLs Intravenous Stopped 12/29/23 2033)  morphine  (PF) 4 MG/ML injection 4 mg (4 mg Intravenous Given 12/29/23 1932)  oxyCODONE  (Oxy IR/ROXICODONE ) immediate release tablet 10 mg (10 mg Oral Given 12/29/23 2035)                                    Medical Decision Making Amount and/or Complexity of Data Reviewed Labs: ordered. Radiology: ordered.  Risk OTC drugs. Prescription drug management.   This patient is a 53 y.o. male  who presents to the ED for concern of abdominal pain, chest pain.   Differential diagnoses prior to evaluation: The emergent differential diagnosis includes, but is not limited to,  ACS, AAS, PE, Mallory-Weiss, Boerhaave's, Pneumonia, acute bronchitis, asthma or COPD exacerbation, anxiety, MSK pain or traumatic injury to the chest, acid reflux versus other, The causes of generalized abdominal pain include but are not limited to AAA, mesenteric ischemia, appendicitis, diverticulitis, DKA, gastritis, gastroenteritis, AMI, nephrolithiasis, pancreatitis, peritonitis, adrenal  insufficiency,lead poisoning, iron toxicity, intestinal ischemia, constipation, UTI,SBO/LBO, splenic rupture, biliary disease, IBD, IBS, PUD, or hepatitis. This is not an exhaustive differential.   Past Medical History / Co-morbidities / Social History:  alcohol  abuse, tobacco abuse, [employer], polysubstance abuse, chronic pancreatitis, hypertension  Additional history: Chart reviewed. Pertinent results include: Extensively reviewed recent lab work, imaging from patient's previous emergency department visits, hospitalizations.  Physical Exam: Physical exam performed. The pertinent findings include: Diffuse epigastric tenderness to palpation, similar compared to his known baseline.  Hypertension, blood pressure 180/113, vital signs otherwise stable.  Was initially tachycardic but improved after fluid bolus, pain control.  Lab Tests/Imaging studies: I personally interpreted labs/imaging and the pertinent results include: CBC with mild anemia, hemoglobin 12.3.  CMP notable for bicarb deficit, CO2 16, otherwise unremarkable, normal lipase.  Troponin negative x 2.  Plain film chest x-ray shows no evidence of acute intrathoracic abnormality. I agree with the radiologist interpretation.  Cardiac monitoring: EKG obtained and interpreted by myself and attending physician which shows: Sinus tachycardia, but no significant change from most recent tracing.   Medications: I ordered medication including fluid bolus, morphine , Protonix , GI cocktail, viscous lidocaine ..  I have reviewed the patients home medicines and have made adjustments as needed.   Disposition: After consideration of the diagnostic results and the patients response to treatment, I feel that patient with no new acute abnormality, able to tolerate p.o., and stable for discharge at this time.  He is requesting additional narcotic pain medicine.  Given his frequent emergency department visits, frequent prescriptions for same I do  not think that it is appropriate to refill his narcotic pain medication from the emergency department today.  Give him 1 oral dose prior to discharge, otherwise discussed that his pain medicine should be followed chronically by his primary care doctor or pain management clinic.SABRA   emergency department workup does not suggest an emergent condition requiring admission or immediate intervention beyond what has been performed at this time. The plan is: as above. The patient is safe for discharge and has been instructed to return immediately for worsening symptoms, change in symptoms or any other concerns.   Final diagnoses:  Nonspecific chest pain  Alcohol -induced chronic pancreatitis (HCC)  ED Discharge Orders          Ordered    sucralfate  (CARAFATE ) 1 g tablet  3 times daily with meals        12/29/23 2033    lidocaine  (XYLOCAINE ) 2 % solution  Every 6 hours PRN        12/29/23 2033               Sotirios Navarro H, PA-C 12/29/23 2056    Zackowski, Scott, MD 01/05/24 [phone]

## 2023-12-29 NOTE — ED Triage Notes (Signed)
  Patient BIB EMS from home c/o [facility] and sharp stabbing chest pain on the left side for 2 hours. Patient states that the pain is worse when breathing and moving. Patient states he has had chills with no fever since yesterday. Patient unable to have aspirin  due to gastric ulcer and nitroglycerin  was not given due to medications taken yesterday.

## 2024-01-05 NOTE — ED Provider Notes (Signed)
 ED Progress Note  Received sign out from previous provider.  Patient Summary: [name] is a 53 y.o. male with history and clinical course as previously documented. When care assumed patient was continuing to have abdominal pain.  Given this and his tachycardia a CT abdomen pelvis with contrast was ordered. Action List:  CT abdomen pelvis with contrast reading Admit to medicine  Updates ED Course as of 01/05/24 2001  Fri Jan 05, 2024  1543 CT abdomen pelvis with contrast ordered  1607 MAO paged for admission  1644 Morphine  ordered for pain  2001 Patient having worsening tachycardia, seen by admitting physician.

## 2024-01-07 NOTE — Discharge Summary (Signed)
 Physician Discharge Summary [facility] [address] [place] [address] Dept: [phone] Loc: [phone]   Identifying Information:  [name] [birth year] [id number]  Primary Care Physician: Scarlett Ronal Jenkins Rufino, NP   Code Status: Full Code  Admit Date: 01/05/2024  Discharge Date: 01/07/2024   Discharge To: Home  Discharge Service: [facility]   Discharge Attending Physician: Marga Skeens, MD  Discharge Diagnoses: Principal Problem:   Abdominal pain (POA: Yes) Active Problems:   Alcohol  use disorder, severe, dependence     (POA: Yes)   Asthma, chronic (HHS-HCC) (POA: Yes)   GERD (gastroesophageal reflux disease) (POA: Yes)   Metabolic acidosis, increased anion gap (POA: Yes)   Paroxysmal atrial fibrillation     (POA: Yes)   WPW (Wolff-Parkinson-White syndrome) (POA: Yes) Resolved Problems:   * No resolved hospital problems. *   Outpatient Provider Follow Up Issues:  Follow up with primary care physician  Hospital Course: [name] is a 53 y.o. male who presented to [facility] with Abdominal pain.  Acute gastroenteritisChronic pancreatitis: CT abd showed acute gastroenteritis most likely viral.CT imaging and lipase not consistent with acute pancreatitis flare.  Previous nerve block attempt for pancreatic pain was unsuccessful due to anatomical challenges.  Pt was treated with supportive care including IV fluids, anti-emetics, and prn pain medications.  Pt improved significantly following the above measures and was tolerating po intake without nausea or emesis on day of discharge.  He will continue Creon , Carafate , pantoprazole , and viscous lidocaine .  Encouraged to follow up with gastroenterology from [facility] for further management of his chronic pancreatitis     Chest pain palpitations hx of Wolff-Parkinson-White: unclear etiology ECG on admission with sinus tach now in sinus regular rate.  His chest pain likely secondary to ongoing  issues with GERD, esophagitis, and chronic pancreatitis.  His admission tachycardia likely multifactorial due to abd pain and volume depletion in the setting of poor p.o. and emesis from gastroenteritis/EtoH which resolved with IV hydration.  He does have a history of Wolff-Parkinson-White syndrome with prior ablation in 2015 and Zio patch in the past showing sustained wide-complex tachycardia. No recent syncope, but previous episode with forearm fracture two months ago and fall with facial injury 1 year ago.  His blood pressure was normotensive to hypertensive and troponins were negative during this admission.  He received 10 mg IV diltiazem and additional 1 L LR in the ED.  Has been on diltiazem in the past but recently transition to carvedilol .  He was continued carvedilol  following hydration and he remained stable.  We repleted magnesium  and potassium as needed   Presyncopal events - recent event monitor fairly reassuring against cardiac arrhythmia causing syncope.  Most likely volume depletion in the setting of poor p.o and chronic alcohol  use   Anion gap metabolic acidosis Likely in the setting of poor nutritional intake and possibly some degree of alcoholic ketoacidosis.  He received IV thiamine  and D5 half-normal until able to take po    Alcohol  withdrawal  Chronic alcohol  use, consuming 4 12-ounce beers daily. Last drink at 2 AM on day of admission.  History of alcohol  withdrawal when consuming more than current intake. Attempting to reduce alcohol  consumption.  We started phenobarbital taper given CIWA of 17 which was tapered quickly given clinical stability prior to discharge  Procedures: No admission procedures for hospital encounter. ______________________________________________________________________ Discharge Medications:   Your Medication List     STOP taking these medications    HYDROcodone -acetaminophen  5-325 mg  per tablet Commonly known as: NORCO   oxyCODONE -acetaminophen   5-325 mg per tablet Commonly known as: PERCOCET       CHANGE how you take these medications    oxyCODONE  10 mg immediate release tablet Commonly known as: ROXICODONE  Take 1 tablet (10 mg total) by mouth every six (6) hours as needed for up to 5 days. What changed:  medication strength how much to take when to take this reasons to take this       CONTINUE taking these medications    carvedilol  25 MG tablet Commonly known as: COREG  Take 1 tablet (25 mg total) by mouth two (2) times a day.   chlorhexidine  0.12 % solution Commonly known as: PERIDEX  SWISH 1 CAPFUL AND HOLD FOR 2 MINUTES THEN EXPECTORATE BID   cholestyramine  light 4 gram Pwpk Generic drug: cholestyramine -aspartame Take 4 g by mouth Three (3) times a day.   famotidine  10 MG tablet Commonly known as: PEPCID  Take 10 mg by mouth Two (2) times a day.   GI cocktail Susp suspension Take 50 mL by mouth once.   lidocaine  2 % Soln Commonly known as: lidocaine  15 mL by Mouth route every three (3) hours.   pancrelipase  (Lip-Prot-Amyl) 12,000-38,000 -60,000 unit Cpdr capsule, delayed release Commonly known as: CREON  Take 1 capsule (12,000 units of lipase total) by mouth Three (3) times a day with a meal.   pantoprazole  40 MG tablet Commonly known as: Protonix  Take 1 tablet (40 mg total) by mouth daily.   sertraline  25 MG tablet Commonly known as: ZOLOFT  Take 75 mg by mouth daily.   sucralfate  1 gram tablet Commonly known as: CARAFATE  Take 1 g by mouth Four (4) times a day.        Allergies: Patient has no known allergies. ______________________________________________________________________ Pending Test Results (if blank, then none):   Most Recent Labs: All lab results last 24 hours - No results found for this or any previous visit (from the past 24 hours).  Relevant Studies/Radiology (if blank, then none): ECG 12 Lead Result Date: 01/06/2024 SINUS TACHYCARDIA RIGHT ATRIAL ABNORMALITY  NONSPECIFIC ST AND T WAVE ABNORMALITY WHEN COMPARED WITH ECG OF 05-Jan-2024 17:58, RATE HAS DECREASED AND PREMATURE ATRIAL BEATS ARE NO LONGER PRESENT Confirmed by Vinie Dunnings ([id number]) on 01/06/2024 8:07:43 AM  ECG 12 Lead Result Date: 01/06/2024 SINUS TACHYCARDIA WITH PREMATURE ATRIAL BEATS RIGHT ATRIAL ABNORMALITY NONSPECIFIC ST AND T WAVE ABNORMALITY WHEN COMPARED WITH ECG OF 05-Jan-2024 17:16, NO SIGNIFICANT CHANGE WAS FOUND Confirmed by Vinie Dunnings ([id number]) on 01/06/2024 7:54:30 AM  ECG 12 Lead Result Date: 01/05/2024 SINUS TACHYCARDIA WITH PREMATURE ATRIAL BEATS WITH ABERRANT CONDUCTION RIGHT ATRIAL ENLARGEMENT BORDERLINE ECG WHEN COMPARED WITH ECG OF 05-Jan-2024 10:33, ABERRANT CONDUCTION IS NOW PRESENT Confirmed by Quinton Goad [phone]) on 01/05/2024 7:33:41 PM  ECG 12 Lead Result Date: 01/05/2024 SINUS TACHYCARDIA RIGHT ATRIAL ENLARGEMENT NONSPECIFIC T WAVE ABNORMALITY ABNORMAL ECG WHEN COMPARED WITH ECG OF 06-Mar-2020 22:05, VENT. RATE HAS INCREASED by  54 bpm T WAVE INVERSION LESS EVIDENT IN INFERIOR LEADS Confirmed by Quinton Goad [phone]) on 01/05/2024 7:02:48 PM  CT Abdomen Pelvis W IV Contrast Result Date: 01/05/2024 EXAM: CT ABDOMEN PELVIS W CONTRAST ACCESSION: [id number] UN REPORT DATE: 01/05/2024 4:15 PM CLINICAL INDICATION: 53 years old with abdominal pain, chronic pancretitis  COMPARISON: None TECHNIQUE: A helical CT scan of the abdomen and pelvis was obtained following IV contrast from the lung bases through the pubic symphysis. Images were reconstructed in the axial plane. Coronal and sagittal reformatted images were also  provided for further evaluation. FINDINGS: LOWER CHEST: Unremarkable. LIVER: Normal liver contour.  No focal liver lesions. BILIARY: The gallbladder is normal in appearance. No biliary ductal dilatation.  SPLEEN: Normal in size and contour. PANCREAS: Diffuse pancreatic calcifications, consistent with known history of chronic pancreatitis. Cystic degeneration of  the pancreatic tail with a 4.9 x 4.3 cm pseudocyst in the distal pancreatic body/tail (2:25) and in additional 1.2 cm smaller cystic structure in the tail of the pancreas (2:28). No ductal dilation. ADRENAL GLANDS: Normal appearance of the adrenal glands. KIDNEYS/URETERS: Symmetric renal enhancement.  No hydronephrosis.  No solid renal mass. BLADDER: Unremarkable. REPRODUCTIVE ORGANS: Unremarkable. GI TRACT: Distended fluid-filled stomach with wall thickening and enhancement. Multiple mildly distended fluid-filled loops of small bowel with wall thickening and enhancement. The sigmoid is dilated with an air-fluid level, otherwise the colon is nondilated. There is no evidence of obstruction. Appendix is normal. PERITONEUM, RETROPERITONEUM AND MESENTERY: No free air.  No ascites.  No fluid collection. LYMPH NODES: No adenopathy. VESSELS: Hepatic and portal veins are patent. Scattered vascular calcifications involving the abdominal aorta and its major branches. BONES and SOFT TISSUES: Multilevel degenerative changes of the spine. No aggressive osseous lesions.  No focal soft tissue lesions.   1. Acute gastroenteritis which may be infectious or inflammatory. No evidence of obstruction. 2. Sequelae of chronic pancreatitis with cystic lesion of the pancreatic body and tail with the largest one measuring 4.9 x 4.3 cm. This may be sequela of pancreatitis (such as a pseudocyst) however, in the absence of prior exams, this is of unknown chronicity and neoplastic processes such as IPMNs remain a consideration. Short-term follow-up (in the order of 3 to 6 months with MRI /MRCP can be considered) FOLLOW-UP RECOMMENDATION: Item for Follow Up: 1. Acuity: Non-urgent 2. Modality: MR 3. Anatomy: Abdomen 4. TimeFrame: 3 Months   ______________________________________________________________________ Discharge Instructions:  Activity Instructions     Activity as tolerated         Diet Instructions     Discharge diet  (specify)     Discharge Nutrition Therapy: Regular       Other Instructions     Call MD for:  persistent nausea or vomiting     Call MD for:  severe uncontrolled pain     Call MD for: Temperature > 38.5 Celsius ( > 101.3 Fahrenheit)     Discharge instructions     Discharge Instructions for Gastroenteritis      Gastroenteritis is commonly called the  stomach flu.  It is an inflammation of the gastrointestinal tract, which includes your stomach and intestines     Many kinds of bacteria and viruses cause gastroenteritis. So, symptoms can vary. In some types of gastroenteritis, symptoms come on quickly. In others, they don t appear for 24-48 hours. Symptoms can range from mild to severe and may include:      Watery diarrhea     Nausea and vomiting     Fever and chills     Abdominal pain     Blood in the stool (in severe cases)      Bacterial gastroenteritis often goes away without treatment. In some cases, symptoms are gone in a day or two. In others, symptoms linger for weeks. In certain cases, it can take months for your bowels to return to normal.     Replacing fluids lost through diarrhea and vomiting is important for a full recovery. If you are very dehydrated, you will be given fluids through an IV (intravenous) line  placed in your arm.     Medications that slow diarrhea may not be prescribed. They can prolong your illness.     Your doctor will prescribe antibiotics only if your symptoms are caused by certain types of bacteria.   Easing Symptoms of Bacterial Gastroenteritis In most cases, bacterial gastroenteritis is treated at home. To ease symptoms and prevent complications:      Get plenty of rest.     Drink lots of liquids to replace water  lost through diarrhea and vomiting. Plain water , clear soups, and electrolyte solutions are best. (You can find electrolyte solutions in most drugstores.) Avoid carbonated drinks, alcohol , coffee, tea, colas, milk, and fruit juice. These can  make symptoms worse. If nausea and vomiting make it hard for you to drink, try sucking on ice chips.     Eat according to your healthcare provider s instructions.     Until the diarrhea clears up, avoid eating fruit and all dairy except yogurt. They can make diarrhea worse.   Preventing Bacterial Gastroenteritis at Home      Always wash your hands well before preparing food and after handling raw meat and poultry.     Wash all raw fruits and vegetables even packaged ones with a scrub brush or vegetable wash.     Use one cutting board just for meat. Wash all cutting boards in hot, soapy water  after use.     Cook meat (beef, pork, lamb) until a meat thermometer reaches at least 145 F to 160 F (63 C to 71 C). Chicken and [place] should be cooked to at least 165 F (74 C).     Wash your hands well after changing diapers. Dispose of diapers carefully so bacteria don t spread.     Wash your hands well before and after contact with someone who is ill. Use soap and water  or an alcohol -based hand gel containing at least 60 percent alcohol .     Wear gloves when handling clothing, bed linen, or towels belonging to a sick person. Discard the gloves after each use. Then wash your hands well. Wash bed linen and other personal items separately in hot water  with detergent and liquid bleach.   Many hospitals and nursing homes take these steps to help prevent the spread of gastroenteritis:      Handwashing: Healthcare workers wash their hands well with soap and water  or use an alcohol -based hand cleaner before and after treating each patient. They also wash their hands after touching any surface that may be contaminated.     Protective clothing: Healthcare workers wear gloves and sometimes gowns when working with patients who have gastroenteritis. They remove these items before leaving the room.     Private rooms: Patients with bacterial gastroenteritis are placed in private rooms or share a room with others who  have the same infection.     Safe food handling: Kitchen workers wash their hands often, cook foods properly, and disinfect all work surfaces.   Call Your Doctor If You Have Any of the Following:      Worsening symptoms     Blood in your stool or your stools look black     Signs of dehydration, such as a dry mouth, intense thirst, and little or no urine       Follow Up instructions and Outpatient Referrals    Call MD for:  persistent nausea or vomiting     Call MD for:  severe uncontrolled pain     Call  MD for: Temperature > 38.5 Celsius ( > 101.3 Fahrenheit)     Discharge instructions        ______________________________________________________________________ Discharge Day Services: BP 128/84   Pulse 81   Temp 36.5 C (97.7 F) (Oral)   Resp 18   Ht 172.7 cm (5' 8)   Wt 58.1 kg (128 lb)   SpO2 99%   BMI 19.46 kg/m  Pt seen on the day of discharge and determined appropriate for discharge.  Condition at Discharge: good  Length of Discharge: I spent greater than 30 mins in the discharge of this patient.

## 2024-01-08 ENCOUNTER — Emergency Department (HOSPITAL_COMMUNITY)
Admission: EM | Admit: 2024-01-08 | Discharge: 2024-01-08 | Disposition: A | Discharge status: Home / Self Care | Payer: MEDICAID | Attending: Emergency Medicine | Admitting: Emergency Medicine

## 2024-01-08 ENCOUNTER — Emergency Department (HOSPITAL_COMMUNITY): Payer: MEDICAID

## 2024-01-08 ENCOUNTER — Other Ambulatory Visit: Payer: Self-pay

## 2024-01-08 ENCOUNTER — Encounter (HOSPITAL_COMMUNITY): Payer: Self-pay

## 2024-01-08 DIAGNOSIS — K2920 Alcoholic gastritis without bleeding: Secondary | ICD-10-CM | POA: Diagnosis not present

## 2024-01-08 DIAGNOSIS — I456 Pre-excitation syndrome: Secondary | ICD-10-CM | POA: Diagnosis not present

## 2024-01-08 DIAGNOSIS — R002 Palpitations: Secondary | ICD-10-CM | POA: Insufficient documentation

## 2024-01-08 DIAGNOSIS — R109 Unspecified abdominal pain: Secondary | ICD-10-CM | POA: Diagnosis present

## 2024-01-08 DIAGNOSIS — Z91040 Latex allergy status: Secondary | ICD-10-CM | POA: Diagnosis not present

## 2024-01-08 LAB — BASIC METABOLIC PANEL WITH GFR
Anion gap: 11 (ref 5–15)
BUN: 8 mg/dL (ref 6–20)
CO2: 18 mmol/L — ABNORMAL LOW (ref 22–32)
Calcium: 7.7 mg/dL — ABNORMAL LOW (ref 8.9–10.3)
Chloride: 109 mmol/L (ref 98–111)
Creatinine, Ser: 0.93 mg/dL (ref 0.61–1.24)
GFR, Estimated: >60 mL/min (ref >60)
Glucose, Bld: 93 mg/dL (ref 70–99)
Potassium: 3.4 mmol/L — ABNORMAL LOW (ref 3.5–5.1)
Sodium: 138 mmol/L (ref 135–145)

## 2024-01-08 LAB — CBC
HCT: 31.3 % — ABNORMAL LOW (ref 39.0–52.0)
Hemoglobin: 10 g/dL — ABNORMAL LOW (ref 13.0–17.0)
MCH: 29.2 pg (ref 26.0–34.0)
MCHC: 31.9 g/dL (ref 30.0–36.0)
MCV: 91.3 fL (ref 80.0–100.0)
Platelets: 132 K/uL — ABNORMAL LOW (ref 150–400)
RBC: 3.43 MIL/uL — ABNORMAL LOW (ref 4.22–5.81)
RDW: 16.2 % — ABNORMAL HIGH (ref 11.5–15.5)
WBC: 6.9 K/uL (ref 4.0–10.5)
nRBC: 0 % (ref 0.0–0.2)

## 2024-01-08 LAB — HEPATIC FUNCTION PANEL
ALT: 22 U/L (ref 0–44)
AST: 25 U/L (ref 15–41)
Albumin: 3.8 g/dL (ref 3.5–5.0)
Alkaline Phosphatase: 61 U/L (ref 38–126)
Bilirubin, Direct: 0.1 mg/dL (ref 0.0–0.2)
Indirect Bilirubin: 0.6 mg/dL (ref 0.3–0.9)
Total Bilirubin: 0.7 mg/dL (ref 0.0–1.2)
Total Protein: 6.8 g/dL (ref 6.5–8.1)

## 2024-01-08 LAB — TROPONIN I (HIGH SENSITIVITY): Troponin I (High Sensitivity): 7 ng/L (ref <18)

## 2024-01-08 LAB — LIPASE, BLOOD: Lipase: 21 U/L (ref 11–51)

## 2024-01-08 MED ORDER — FAMOTIDINE 20 MG PO TABS
40.0000 mg | ORAL_TABLET | Freq: Once | ORAL | Status: AC
  Administered 2024-01-08: 40 mg via ORAL
  Filled 2024-01-08: qty 2

## 2024-01-08 MED ORDER — LORAZEPAM 1 MG PO TABS
2.0000 mg | ORAL_TABLET | Freq: Once | ORAL | Status: AC
  Administered 2024-01-08: 2 mg via ORAL
  Filled 2024-01-08: qty 2

## 2024-01-08 MED ORDER — HYOSCYAMINE SULFATE 0.125 MG SL SUBL
0.1250 mg | SUBLINGUAL_TABLET | Freq: Once | SUBLINGUAL | Status: AC
  Administered 2024-01-08: 0.125 mg via ORAL
  Filled 2024-01-08: qty 1

## 2024-01-08 MED ORDER — ALUM & MAG HYDROXIDE-SIMETH 200-200-20 MG/5ML PO SUSP
30.0000 mL | Freq: Once | ORAL | Status: AC
  Administered 2024-01-08: 30 mL via ORAL
  Filled 2024-01-08: qty 30

## 2024-01-08 MED ORDER — OXYCODONE-ACETAMINOPHEN 5-325 MG PO TABS
1.0000 | ORAL_TABLET | Freq: Once | ORAL | Status: AC
  Administered 2024-01-08: 1 via ORAL
  Filled 2024-01-08: qty 1

## 2024-01-08 NOTE — ED Provider Notes (Signed)
 [place] EMERGENCY DEPARTMENT AT [facility] Provider Note   CSN: [id number] Arrival date & time: 01/08/24  0157     Patient presents with: Abdominal Pain and Chest Pain (Possibly secondary to abd pain. )   [name] is a 53 y.o. male.   Presents to the emergency department for evaluation of abdominal pain.  Patient with history of chronic pancreatitis secondary to alcohol  abuse.  Patient reports that he was just admitted to [facility] with a similar complaints.  He was discharged, got home and thought he might be having some withdrawal symptoms because he was seeing black spots.  He drank a portion of a beer and then started vomiting.       Prior to Admission medications   Medication Sig Start Date End Date Taking? Authorizing Provider  acetaminophen  (TYLENOL ) 500 MG tablet Take 1,000 mg by mouth daily as needed for mild pain (pain score 1-3) or moderate pain (pain score 4-6).    [provider]  carvedilol  (COREG ) 25 MG tablet Take 1 tablet (25 mg total) by mouth 2 (two) times daily with a meal. 12/08/23   West, Katlyn D, NP  famotidine  (PEPCID ) 40 MG tablet Take 1 tablet (40 mg total) by mouth daily. Patient taking differently: Take 40 mg by mouth 2 (two) times daily. 10/17/23   Henderly, Britni A, PA-C  lidocaine  (XYLOCAINE ) 2 % solution Use as directed 15 mLs in the mouth or throat every 6 (six) hours as needed for mouth pain. 12/29/23   Prosperi, Christian H, PA-C  lipase/protease/amylase (CREON ) 36000 UNITS CPEP capsule Take 2 capsules (72,000 Units total) by mouth 3 (three) times daily with meals. May also take 1 capsule (36,000 Units total) as needed (with snacks). 08/21/23   Ghimire, Donalda HERO, MD  loperamide  (IMODIUM  A-D) 2 MG tablet Take 2 mg by mouth 4 (four) times daily as needed for diarrhea or loose stools.    [provider]  magnesium  oxide (MAG-OX) 400 (240 Mg) MG tablet Take 1 tablet (400 mg total) by mouth daily. 11/07/23   Hoy Nidia FALCON, PA-C  Multiple Vitamin (MULTIVITAMIN WITH MINERALS) TABS tablet Take 1 tablet by mouth daily. Patient taking differently: Take 2 tablets by mouth daily. 08/21/23   Ghimire, Donalda HERO, MD  nicotine  (NICODERM CQ  - DOSED IN MG/24 HOURS) 21 mg/24hr patch Place 1 patch (21 mg total) onto the skin daily. 11/14/23   Amin, Ankit C, MD  ondansetron  (ZOFRAN -ODT) 4 MG disintegrating tablet Take 1 tablet (4 mg total) by mouth every 8 (eight) hours as needed. 11/23/23   Vernon Ranks, MD  oxyCODONE -acetaminophen  (PERCOCET) 5-325 MG tablet Take 1 tablet by mouth every 4 (four) hours as needed. 12/21/23   Donnajean Lynwood DEL, PA-C  pantoprazole  (PROTONIX ) 40 MG tablet Take 1 tablet (40 mg total) by mouth daily. 12/21/23   Donnajean Lynwood DEL, PA-C  polyethylene glycol powder (GLYCOLAX /MIRALAX ) 17 GM/SCOOP powder Take 17 g by mouth daily as needed for mild constipation. 11/13/23   Amin, Ankit C, MD  sucralfate  (CARAFATE ) 1 g tablet Take 1 tablet (1 g total) by mouth 3 (three) times daily with meals. 12/29/23   Prosperi, Christian H, PA-C  tamsulosin  (FLOMAX ) 0.4 MG CAPS capsule Take 1 capsule (0.4 mg total) by mouth daily. Patient not taking: Reported on 12/08/2023 08/21/23   Raenelle Donalda HERO, MD  amitriptyline  (ELAVIL ) 25 MG tablet Take 1 tablet (25 mg total) by mouth at bedtime. Patient not taking: Reported on 08/08/2019 10/15/18 08/08/19  Tobie Yetta HERO, MD    Allergies: Robaxin  [methocarbamol ], Bayer aspirin  [aspirin ], Trazodone  and nefazodone, Adhesive [tape], Latex, Toradol  [ketorolac  tromethamine ], Contrast media [iodinated contrast media], Fish allergy, Reglan  [metoclopramide ], Salmon [fish oil], and Shellfish allergy    Review of Systems  Updated Vital Signs BP (!) 180/121   Pulse (!) 108   Temp 97.8 F (36.6 C)   Resp 18   SpO2 100%   Physical Exam Vitals and nursing note reviewed.  Constitutional:      General: He is not in acute distress.    Appearance: He is well-developed.  HENT:     Head:  Normocephalic and atraumatic.     Mouth/Throat:     Mouth: Mucous membranes are moist.  Eyes:     General: Vision grossly intact. Gaze aligned appropriately.     Extraocular Movements: Extraocular movements intact.     Conjunctiva/sclera: Conjunctivae normal.  Cardiovascular:     Rate and Rhythm: Normal rate and regular rhythm.     Pulses: Normal pulses.     Heart sounds: Normal heart sounds, S1 normal and S2 normal. No murmur heard.    No friction rub. No gallop.  Pulmonary:     Effort: Pulmonary effort is normal. No respiratory distress.     Breath sounds: Normal breath sounds.  Abdominal:     Palpations: Abdomen is soft.     Tenderness: There is generalized abdominal tenderness. There is no guarding or rebound.     Hernia: No hernia is present.  Musculoskeletal:        General: No swelling.     Cervical back: Full passive range of motion without pain, normal range of motion and neck supple. No pain with movement, spinous process tenderness or muscular tenderness. Normal range of motion.     Right lower leg: No edema.     Left lower leg: No edema.  Skin:    General: Skin is warm and dry.     Capillary Refill: Capillary refill takes less than 2 seconds.     Findings: No ecchymosis, erythema, lesion or wound.  Neurological:     Mental Status: He is alert and oriented to person, place, and time.     GCS: GCS eye subscore is 4. GCS verbal subscore is 5. GCS motor subscore is 6.     Cranial Nerves: Cranial nerves 2-12 are intact.     Sensory: Sensation is intact.     Motor: Motor function is intact. No weakness or abnormal muscle tone.     Coordination: Coordination is intact.  Psychiatric:        Mood and Affect: Mood normal.        Speech: Speech normal.        Behavior: Behavior normal.     (all labs ordered are listed, but only abnormal results are displayed) Labs Reviewed  BASIC METABOLIC PANEL WITH GFR - Abnormal; Notable for the following components:      Result Value    Potassium 3.4 (*)    CO2 18 (*)    Calcium  7.7 (*)    All other components within normal limits  CBC - Abnormal; Notable for the following components:   RBC 3.43 (*)    Hemoglobin 10.0 (*)    HCT 31.3 (*)    RDW 16.2 (*)    Platelets 132 (*)    All other components within normal limits  LIPASE, BLOOD  HEPATIC FUNCTION PANEL  TROPONIN I (HIGH SENSITIVITY)  TROPONIN I (HIGH SENSITIVITY)  EKG: EKG Interpretation Date/Time:  Monday January 08 2024 02:06:07 EDT Ventricular Rate:  97 PR Interval:  111 QRS Duration:  78 QT Interval:  320 QTC Calculation: 407 R Axis:   65  Text Interpretation: Sinus tachycardia Multiform ventricular premature complexes Borderline short PR interval Biatrial enlargement Borderline repolarization abnormality No significant change since last tracing Confirmed by Haze Lonni PARAS [phone]) on 01/08/2024 3:39:08 AM  Radiology: [facility] Chest 2 View Result Date: 01/08/2024 CLINICAL DATA:  cp EXAM: CHEST - 2 VIEW COMPARISON:  chest radiographs 12/29/2023. FINDINGS: The heart size and mediastinal contours are within normal limits. Both lungs are clear. No visible pleural effusions or pneumothorax. No acute osseous abnormality. IMPRESSION: No active cardiopulmonary disease. Electronically Signed   By: Gilmore GORMAN Molt M.D.   On: 01/08/2024 03:48     Procedures   Medications Ordered in the ED  alum & mag hydroxide-simeth (MAALOX/MYLANTA) 200-200-20 MG/5ML suspension 30 mL (has no administration in time range)  hyoscyamine  (LEVSIN ) tablet 0.125 mg (has no administration in time range)  famotidine  (PEPCID ) tablet 40 mg (has no administration in time range)  LORazepam  (ATIVAN ) tablet 2 mg (2 mg Oral Given 01/08/24 0440)  oxyCODONE -acetaminophen  (PERCOCET/ROXICET) 5-325 MG per tablet 1 tablet (1 tablet Oral Given 01/08/24 0440)                                    Medical Decision Making Amount and/or Complexity of Data Reviewed External Data Reviewed:  radiology, ECG and notes. Labs: ordered. Decision-making details documented in ED Course. Radiology: ordered and independent interpretation performed. Decision-making details documented in ED Course.  Risk Prescription drug management.   Differential Diagnosis considered includes, but not limited to: Cholelithiasis; cholecystitis; cholangitis; bowel obstruction; esophagitis; gastritis; peptic ulcer disease; pancreatitis; cardiac.  Patient with chronic abdominal pain, chronic chest pain.  He has been seen numerous times recently.  I reviewed his hospitalization at [facility]  He was felt to have alcoholic gastritis during that visit.  Patient reports that he has been compliant with his Protonix  and Carafate .  His workup today has been unremarkable.  Troponins are normal.  Lipase is not elevated.  I do not feel that additional imaging is warranted, he had a CT a couple of days ago at [facility].  Patient complaining of palpitations.  He has frequent extrasystoles on monitor.  Patient with history of WPW.  No malignant arrhythmias noted.  Patient reports that his doctor recently changed him from metoprolol  to carvedilol .  He does not feel like that is suppressing his palpitations as well.  I am hesitant to change his meds but I do not think that having an additional metoprolol  to use as needed would be harmful.  Will ask cardiology to follow him up in the office.  He does not require hospitalization today.     Final diagnoses:  Palpitations  WPW (Wolff-Parkinson-White syndrome)  Chronic alcoholic gastritis without hemorrhage    ED Discharge Orders          Ordered    Ambulatory referral to Cardiology       Comments: If you have not heard from the Cardiology office within the next 72 hours please call [phone].   01/08/24 0502               Haze Lonni PARAS, MD 01/08/24 0502

## 2024-01-08 NOTE — ED Triage Notes (Addendum)
 CP/ABD pain just DC from [facility] earlier with same complaints. Around 6pm Pt states he took Viagra , had a beer, tried to eat a burrito, and vomited. CP/ABD pain began around midnight. Pt has hx of stomach ulcers. Pt given 8mg  morphine , 4mg  zofran  by EMS prior to arrival

## 2024-01-09 ENCOUNTER — Encounter (HOSPITAL_COMMUNITY): Payer: Self-pay | Admitting: Emergency Medicine

## 2024-01-09 ENCOUNTER — Emergency Department (HOSPITAL_COMMUNITY)
Admission: EM | Admit: 2024-01-09 | Discharge: 2024-01-09 | Discharge status: Against Medical Advice | Payer: MEDICAID | Attending: Emergency Medicine | Admitting: Emergency Medicine

## 2024-01-09 ENCOUNTER — Other Ambulatory Visit: Payer: Self-pay

## 2024-01-09 ENCOUNTER — Emergency Department (HOSPITAL_COMMUNITY): Payer: MEDICAID

## 2024-01-09 ENCOUNTER — Encounter (HOSPITAL_COMMUNITY): Payer: Self-pay

## 2024-01-09 ENCOUNTER — Emergency Department (HOSPITAL_COMMUNITY)
Admission: EM | Admit: 2024-01-09 | Discharge: 2024-01-09 | Disposition: A | Discharge status: Home / Self Care | Payer: MEDICAID | Source: Home / Self Care | Attending: Emergency Medicine | Admitting: Emergency Medicine

## 2024-01-09 DIAGNOSIS — F1010 Alcohol abuse, uncomplicated: Secondary | ICD-10-CM | POA: Insufficient documentation

## 2024-01-09 DIAGNOSIS — R1013 Epigastric pain: Secondary | ICD-10-CM | POA: Insufficient documentation

## 2024-01-09 DIAGNOSIS — I10 Essential (primary) hypertension: Secondary | ICD-10-CM | POA: Insufficient documentation

## 2024-01-09 DIAGNOSIS — G8929 Other chronic pain: Secondary | ICD-10-CM | POA: Insufficient documentation

## 2024-01-09 DIAGNOSIS — J45909 Unspecified asthma, uncomplicated: Secondary | ICD-10-CM | POA: Diagnosis not present

## 2024-01-09 DIAGNOSIS — Z5329 Procedure and treatment not carried out because of patient's decision for other reasons: Secondary | ICD-10-CM | POA: Insufficient documentation

## 2024-01-09 DIAGNOSIS — Z91040 Latex allergy status: Secondary | ICD-10-CM | POA: Insufficient documentation

## 2024-01-09 DIAGNOSIS — R072 Precordial pain: Secondary | ICD-10-CM | POA: Diagnosis not present

## 2024-01-09 DIAGNOSIS — R1012 Left upper quadrant pain: Secondary | ICD-10-CM | POA: Insufficient documentation

## 2024-01-09 DIAGNOSIS — R079 Chest pain, unspecified: Secondary | ICD-10-CM | POA: Diagnosis present

## 2024-01-09 LAB — BASIC METABOLIC PANEL WITH GFR
Anion gap: 17 — ABNORMAL HIGH (ref 5–15)
BUN: 6 mg/dL (ref 6–20)
CO2: 18 mmol/L — ABNORMAL LOW (ref 22–32)
Calcium: 8.9 mg/dL (ref 8.9–10.3)
Chloride: 101 mmol/L (ref 98–111)
Creatinine, Ser: 1.07 mg/dL (ref 0.61–1.24)
GFR, Estimated: >60 mL/min (ref >60)
Glucose, Bld: 72 mg/dL (ref 70–99)
Potassium: 3.2 mmol/L — ABNORMAL LOW (ref 3.5–5.1)
Sodium: 136 mmol/L (ref 135–145)

## 2024-01-09 LAB — CBC
HCT: 35.1 % — ABNORMAL LOW (ref 39.0–52.0)
Hemoglobin: 11.2 g/dL — ABNORMAL LOW (ref 13.0–17.0)
MCH: 29 pg (ref 26.0–34.0)
MCHC: 31.9 g/dL (ref 30.0–36.0)
MCV: 90.9 fL (ref 80.0–100.0)
Platelets: 153 K/uL (ref 150–400)
RBC: 3.86 MIL/uL — ABNORMAL LOW (ref 4.22–5.81)
RDW: 16.6 % — ABNORMAL HIGH (ref 11.5–15.5)
WBC: 8.1 K/uL (ref 4.0–10.5)
nRBC: 0 % (ref 0.0–0.2)

## 2024-01-09 LAB — TROPONIN I (HIGH SENSITIVITY): Troponin I (High Sensitivity): 10 ng/L (ref <18)

## 2024-01-09 MED ORDER — FAMOTIDINE 20 MG PO TABS
40.0000 mg | ORAL_TABLET | Freq: Once | ORAL | Status: AC
  Administered 2024-01-09: 40 mg via ORAL
  Filled 2024-01-09: qty 2

## 2024-01-09 MED ORDER — ALUM & MAG HYDROXIDE-SIMETH 200-200-20 MG/5ML PO SUSP
30.0000 mL | Freq: Once | ORAL | Status: AC
  Administered 2024-01-09: 30 mL via ORAL
  Filled 2024-01-09: qty 30

## 2024-01-09 MED ORDER — DICYCLOMINE HCL 10 MG PO CAPS
10.0000 mg | ORAL_CAPSULE | Freq: Once | ORAL | Status: AC
  Administered 2024-01-09: 10 mg via ORAL
  Filled 2024-01-09: qty 1

## 2024-01-09 MED ORDER — OXYCODONE-ACETAMINOPHEN 5-325 MG PO TABS
1.0000 | ORAL_TABLET | Freq: Once | ORAL | Status: AC
  Administered 2024-01-09: 1 via ORAL
  Filled 2024-01-09: qty 1

## 2024-01-09 NOTE — ED Provider Notes (Signed)
 [place] EMERGENCY DEPARTMENT AT [facility] Provider Note   CSN: [id number] Arrival date & time: 01/09/24  9742     Patient presents with: Chest Pain   [name] is a 53 y.o. male.   The history is provided by the patient.  Patient with extensive history including Chronic pancreatitis, alcohol  use disorder, bipolar disorder, chronic pain Wolff-Parkinson-White s/p ablation presents for multiple complaints. Patient reports has been having lower chest pain and abdominal pain concerning for his gastritis and pancreatitis.  Patient admits to alcohol  use.  Patient has been seen multiple times for this recently Past Medical History:  Diagnosis Date   Alcoholism /alcohol  abuse    Anemia    Anxiety    Arthritis    knees; arms; elbows (03/26/2015)   Asthma    Bipolar disorder (HCC)    Chronic bronchitis (HCC)    Chronic lower back pain    Chronic pancreatitis (HCC)    Cocaine abuse (HCC)    Depression    Family history of adverse reaction to anesthesia    Femoral condyle fracture (HCC) 03/08/2014   left medial/notes 03/09/2014   GERD (gastroesophageal reflux disease)    H/O hiatal hernia    H/O suicide attempt 10/2012   High cholesterol    History of blood transfusion 10/2012   when I tried to commit suicide   History of stomach ulcers    Hypertension    Marijuana abuse, continuous    Migraine    a few times/year (03/26/2015)   Pancreatitis    Pneumonia 1990's X 3   PTSD (post-traumatic stress disorder)    Seizures (HCC)    Sickle cell trait (HCC)    WPW (Wolff-Parkinson-White syndrome)    thelbert 03/06/2013    Prior to Admission medications   Medication Sig Start Date End Date Taking? Authorizing Provider  acetaminophen  (TYLENOL ) 500 MG tablet Take 1,000 mg by mouth daily as needed for mild pain (pain score 1-3) or moderate pain (pain score 4-6).    [provider]  carvedilol  (COREG ) 25 MG tablet Take 1 tablet (25 mg total) by mouth 2  (two) times daily with a meal. 12/08/23   West, Katlyn D, NP  famotidine  (PEPCID ) 40 MG tablet Take 1 tablet (40 mg total) by mouth daily. Patient taking differently: Take 40 mg by mouth 2 (two) times daily. 10/17/23   Henderly, Britni A, PA-C  lidocaine  (XYLOCAINE ) 2 % solution Use as directed 15 mLs in the mouth or throat every 6 (six) hours as needed for mouth pain. 12/29/23   Prosperi, Christian H, PA-C  lipase/protease/amylase (CREON ) 36000 UNITS CPEP capsule Take 2 capsules (72,000 Units total) by mouth 3 (three) times daily with meals. May also take 1 capsule (36,000 Units total) as needed (with snacks). 08/21/23   Ghimire, Donalda HERO, MD  loperamide  (IMODIUM  A-D) 2 MG tablet Take 2 mg by mouth 4 (four) times daily as needed for diarrhea or loose stools.    [provider]  magnesium  oxide (MAG-OX) 400 (240 Mg) MG tablet Take 1 tablet (400 mg total) by mouth daily. 11/07/23   Hoy Nidia FALCON, PA-C  Multiple Vitamin (MULTIVITAMIN WITH MINERALS) TABS tablet Take 1 tablet by mouth daily. Patient taking differently: Take 2 tablets by mouth daily. 08/21/23   Ghimire, Donalda HERO, MD  nicotine  (NICODERM CQ  - DOSED IN MG/24 HOURS) 21 mg/24hr patch Place 1 patch (21 mg total) onto the skin daily. 11/14/23   Caleen Burgess BROCKS, MD  ondansetron  (  ZOFRAN -ODT) 4 MG disintegrating tablet Take 1 tablet (4 mg total) by mouth every 8 (eight) hours as needed. 11/23/23   Vernon Ranks, MD  oxyCODONE -acetaminophen  (PERCOCET) 5-325 MG tablet Take 1 tablet by mouth every 4 (four) hours as needed. 12/21/23   Donnajean Lynwood DEL, PA-C  pantoprazole  (PROTONIX ) 40 MG tablet Take 1 tablet (40 mg total) by mouth daily. 12/21/23   Donnajean Lynwood DEL, PA-C  polyethylene glycol powder (GLYCOLAX /MIRALAX ) 17 GM/SCOOP powder Take 17 g by mouth daily as needed for mild constipation. 11/13/23   Amin, Ankit C, MD  sucralfate  (CARAFATE ) 1 g tablet Take 1 tablet (1 g total) by mouth 3 (three) times daily with meals. 12/29/23   Prosperi,  Christian H, PA-C  tamsulosin  (FLOMAX ) 0.4 MG CAPS capsule Take 1 capsule (0.4 mg total) by mouth daily. Patient not taking: Reported on 12/08/2023 08/21/23   Raenelle Donalda HERO, MD  amitriptyline  (ELAVIL ) 25 MG tablet Take 1 tablet (25 mg total) by mouth at bedtime. Patient not taking: Reported on 08/08/2019 10/15/18 08/08/19  Tobie Yetta HERO, MD    Allergies: Robaxin  Sandi ], Bayer aspirin  Cayden.Caulk ], Trazodone  and nefazodone, Adhesive [tape], Latex, Toradol  [ketorolac  tromethamine ], Contrast media [iodinated contrast media], Fish allergy, Reglan  [metoclopramide ], Salmon [fish oil], and Shellfish allergy    Review of Systems  Constitutional:  Negative for fever.  Cardiovascular:  Positive for chest pain.  Gastrointestinal:  Positive for abdominal pain. Negative for vomiting.  Neurological:  Positive for dizziness.    Updated Vital Signs BP (!) 145/104 (BP Location: Right Arm)   Pulse 70   Temp 98.5 F (36.9 C) (Oral)   Resp 16   Ht 1.727 m (5' 8)   Wt 56.7 kg   SpO2 100%   BMI 19.01 kg/m   Physical Exam CONSTITUTIONAL: chronically ill appearing, no distress HEAD: Normocephalic/atraumatic EYES: EOMI/PERRL, no icterus ENMT: Mucous membranes moist NECK: supple no meningeal signs SPINE/BACK:entire spine nontender CV: S1/S2 noted, no murmurs/rubs/gallops noted LUNGS: Lungs are clear to auscultation bilaterally, no apparent distress ABDOMEN: soft, nontender, no rebound or guarding, bowel sounds noted throughout abdomen GU:no cva tenderness NEURO: Pt is awake/alert/appropriate, moves all extremitiesx4.  No facial droop.   EXTREMITIES: pulses normal/equal, full ROM SKIN: warm, color normal PSYCH: no abnormalities of mood noted, alert and oriented to situation  (all labs ordered are listed, but only abnormal results are displayed) Labs Reviewed  BASIC METABOLIC PANEL WITH GFR - Abnormal; Notable for the following components:      Result Value   Potassium 3.2 (*)    CO2 18 (*)     Anion gap 17 (*)    All other components within normal limits  CBC - Abnormal; Notable for the following components:   RBC 3.86 (*)    Hemoglobin 11.2 (*)    HCT 35.1 (*)    RDW 16.6 (*)    All other components within normal limits  TROPONIN I (HIGH SENSITIVITY)  TROPONIN I (HIGH SENSITIVITY)    EKG: EKG Interpretation Date/Time:  Tuesday January 09 2024 03:03:17 EDT Ventricular Rate:  90 PR Interval:  121 QRS Duration:  75 QT Interval:  356 QTC Calculation: 436 R Axis:   67  Text Interpretation: Sinus rhythm Atrial premature complexes Biatrial enlargement No significant change was found Confirmed by Midge Golas ([id number]) on 01/09/2024 3:17:07 AM  Radiology: [facility] Chest Portable 1 View Result Date: 01/09/2024 CLINICAL DATA:  Chest pains. EXAM: PORTABLE CHEST 1 VIEW COMPARISON:  PA Lat chest 01/08/2019 at 2:30 a.m. FINDINGS: 3:11 a.m.  There is central bronchial thickening consistent with bronchitis. The lungs are lower in volume today with no convincing focal pneumonia. There is no substantial pleural effusion. The cardiomediastinal silhouette and vascular markings are normal. Osteopenia and mild thoracic spondylosis. IMPRESSION: Central bronchial thickening consistent with bronchitis. No convincing focal pneumonia. Electronically Signed   By: Francis Quam M.D.   On: 01/09/2024 03:27   DG Chest 2 View Result Date: 01/08/2024 CLINICAL DATA:  cp EXAM: CHEST - 2 VIEW COMPARISON:  chest radiographs 12/29/2023. FINDINGS: The heart size and mediastinal contours are within normal limits. Both lungs are clear. No visible pleural effusions or pneumothorax. No acute osseous abnormality. IMPRESSION: No active cardiopulmonary disease. Electronically Signed   By: Gilmore GORMAN Molt M.D.   On: 01/08/2024 03:48     Procedures   Medications Ordered in the ED - No data to display                                  Medical Decision Making Amount and/or Complexity of Data Reviewed Labs:  ordered. Radiology: ordered.  This patient presents to the ED for concern of chest pain, this involves an extensive number of treatment options, and is a complaint that carries with it a high risk of complications and morbidity.  The differential diagnosis includes but is not limited to acute coronary syndrome, aortic dissection, pulmonary embolism, pericarditis, pneumothorax, pneumonia, myocarditis, pleurisy, esophageal rupture   Comorbidities that complicate the patient evaluation: Patient's presentation is complicated by their history of pancreatitis  Social Determinants of Health: Patient's alcohol  use disorder  increases the complexity of managing their presentation  Additional history obtained: Records reviewed Care Everywhere/External Records  Imaging Studies ordered: I ordered imaging studies including X-ray chest  I independently visualized and interpreted imaging which showed no acute finding I agree with the radiologist interpretation  Test Considered: Patient with multiple ER visits and evaluations, will defer further imaging/CT imaging  Complexity of problems addressed: Patient's presentation is most consistent with  exacerbation of chronic illness  Disposition: After consideration of the diagnostic results and the patient's response to treatment,  Patient eloped  Patient presents for his 27th ER visit in 6 months.  Patient just stepped out of [facility] for evaluation of chest pain and gastritis, and has already been seen in the ER once since that time.  He now presents again for continuation of his chronic complaints and admits to alcohol  use.  I counseled patient on the need to establish strong outpatient primary care to manage his significant comorbidities patient reports he has had difficulty connecting with a PCP  When patient asked for pain medicine for his chronic pain, I advised that we will be unable to treat his pain I was then informed that patient  eloped from the emergency department     Final diagnoses:  Precordial pain  Other chronic pain    ED Discharge Orders     None          Midge Golas, MD 01/09/24 [phone]

## 2024-01-09 NOTE — ED Notes (Signed)
 Pt provided urinal and urine cup

## 2024-01-09 NOTE — ED Triage Notes (Signed)
 Patient BIB [facility] from home due to chest pain. Patient states this has been going on for 3 days. Described as pressure medial, stabbing pain to the left chest, pain radiating to neck. Patient recently seen and dx with gastritis. VSS A&Ox4.

## 2024-01-09 NOTE — ED Provider Notes (Signed)
 [place] EMERGENCY DEPARTMENT AT [facility] Provider Note   CSN: [id number] Arrival date & time: 01/09/24  0448     Patient presents with: Chest Pain and Abdominal Pain   [name] is a 53 y.o. male.  With past medical history of drug abuse, GERD, Wolf Parkinson's White syndrome, chronic pancreatitis presents to emergency room with complaint of upper abdominal pain.  He primarily localizes this to epigastric area and left upper quadrant.  Patient reports that this has been ongoing for quite some time but worsened over the last 2 to 3 days.  It is associated with nausea and vomiting and worsened after drinking 1 beer.  He reports he was seen for this earlier but was not given any pain medication so he came here for further treatment.  He is currently denying any chest pain, shortness of breath or cough to me.  Does not want repeat labs at this time.   {Add pertinent medical, surgical, social history, OB history to HPI:32947}  Chest Pain Associated symptoms: abdominal pain   Abdominal Pain Associated symptoms: chest pain        Prior to Admission medications   Medication Sig Start Date End Date Taking? Authorizing Provider  acetaminophen  (TYLENOL ) 500 MG tablet Take 1,000 mg by mouth daily as needed for mild pain (pain score 1-3) or moderate pain (pain score 4-6).    [provider]  carvedilol  (COREG ) 25 MG tablet Take 1 tablet (25 mg total) by mouth 2 (two) times daily with a meal. 12/08/23   West, Katlyn D, NP  famotidine  (PEPCID ) 40 MG tablet Take 1 tablet (40 mg total) by mouth daily. Patient taking differently: Take 40 mg by mouth 2 (two) times daily. 10/17/23   Henderly, Britni A, PA-C  lidocaine  (XYLOCAINE ) 2 % solution Use as directed 15 mLs in the mouth or throat every 6 (six) hours as needed for mouth pain. 12/29/23   Prosperi, Christian H, PA-C  lipase/protease/amylase (CREON ) 36000 UNITS CPEP capsule Take 2 capsules (72,000 Units total) by  mouth 3 (three) times daily with meals. May also take 1 capsule (36,000 Units total) as needed (with snacks). 08/21/23   Ghimire, Donalda HERO, MD  loperamide  (IMODIUM  A-D) 2 MG tablet Take 2 mg by mouth 4 (four) times daily as needed for diarrhea or loose stools.    [provider]  magnesium  oxide (MAG-OX) 400 (240 Mg) MG tablet Take 1 tablet (400 mg total) by mouth daily. 11/07/23   Hoy Nidia FALCON, PA-C  Multiple Vitamin (MULTIVITAMIN WITH MINERALS) TABS tablet Take 1 tablet by mouth daily. Patient taking differently: Take 2 tablets by mouth daily. 08/21/23   Ghimire, Donalda HERO, MD  nicotine  (NICODERM CQ  - DOSED IN MG/24 HOURS) 21 mg/24hr patch Place 1 patch (21 mg total) onto the skin daily. 11/14/23   Amin, Ankit C, MD  ondansetron  (ZOFRAN -ODT) 4 MG disintegrating tablet Take 1 tablet (4 mg total) by mouth every 8 (eight) hours as needed. 11/23/23   Vernon Ranks, MD  oxyCODONE -acetaminophen  (PERCOCET) 5-325 MG tablet Take 1 tablet by mouth every 4 (four) hours as needed. 12/21/23   Donnajean Lynwood DEL, PA-C  pantoprazole  (PROTONIX ) 40 MG tablet Take 1 tablet (40 mg total) by mouth daily. 12/21/23   Donnajean Lynwood DEL, PA-C  polyethylene glycol powder (GLYCOLAX /MIRALAX ) 17 GM/SCOOP powder Take 17 g by mouth daily as needed for mild constipation. 11/13/23   Amin, Ankit C, MD  sucralfate  (CARAFATE ) 1 g tablet Take 1 tablet (1  g total) by mouth 3 (three) times daily with meals. 12/29/23   Prosperi, Christian H, PA-C  tamsulosin  (FLOMAX ) 0.4 MG CAPS capsule Take 1 capsule (0.4 mg total) by mouth daily. Patient not taking: Reported on 12/08/2023 08/21/23   Raenelle Donalda HERO, MD  amitriptyline  (ELAVIL ) 25 MG tablet Take 1 tablet (25 mg total) by mouth at bedtime. Patient not taking: Reported on 08/08/2019 10/15/18 08/08/19  Tobie Yetta HERO, MD    Allergies: Robaxin  [methocarbamol ], Bayer aspirin  [aspirin ], Trazodone  and nefazodone, Adhesive [tape], Latex, Toradol  [ketorolac  tromethamine ], Contrast media  [iodinated contrast media], Fish allergy, Reglan  [metoclopramide ], Garnell gums oil], and Shellfish allergy    Review of Systems  Cardiovascular:  Positive for chest pain.  Gastrointestinal:  Positive for abdominal pain.    Updated Vital Signs BP (!) 155/102   Pulse 87   Temp 97.9 F (36.6 C) (Oral)   Resp 16   Ht 5' 8 (1.727 m)   Wt 56.7 kg   SpO2 96%   BMI 19.01 kg/m   Physical Exam Vitals and nursing note reviewed.  Constitutional:      General: He is not in acute distress.    Appearance: He is not toxic-appearing.  HENT:     Head: Normocephalic and atraumatic.  Eyes:     General: No scleral icterus.    Conjunctiva/sclera: Conjunctivae normal.  Cardiovascular:     Rate and Rhythm: Normal rate and regular rhythm.     Pulses: Normal pulses.     Heart sounds: Normal heart sounds.  Pulmonary:     Effort: Pulmonary effort is normal. No respiratory distress.     Breath sounds: Normal breath sounds.  Chest:     Chest wall: No tenderness.  Abdominal:     General: Abdomen is flat. Bowel sounds are normal. There is no distension.     Palpations: Abdomen is soft. There is no mass.     Tenderness: There is abdominal tenderness.     Comments: Reproducible tenderness to epigastric area and left upper quadrant on exam.  Skin:    General: Skin is warm and dry.     Findings: No lesion.  Neurological:     General: No focal deficit present.     Mental Status: He is alert and oriented to person, place, and time. Mental status is at baseline.     (all labs ordered are listed, but only abnormal results are displayed) Labs Reviewed - No data to display  EKG: EKG Interpretation Date/Time:  Tuesday January 09 2024 05:05:45 EDT Ventricular Rate:  74 PR Interval:  115 QRS Duration:  77 QT Interval:  358 QTC Calculation: 398 R Axis:   64  Text Interpretation: Sinus rhythm Atrial premature complexes Borderline short PR interval Biatrial enlargement Abnormal T, consider  ischemia, diffuse leads When compared with ECG of EARLIER SAME DATE No significant change was found Confirmed by Raford Lenis ([id number]) on 01/09/2024 5:15:34 AM  Radiology: DG Chest Portable 1 View Result Date: 01/09/2024 CLINICAL DATA:  Chest pains. EXAM: PORTABLE CHEST 1 VIEW COMPARISON:  PA Lat chest 01/08/2019 at 2:30 a.m. FINDINGS: 3:11 a.m. There is central bronchial thickening consistent with bronchitis. The lungs are lower in volume today with no convincing focal pneumonia. There is no substantial pleural effusion. The cardiomediastinal silhouette and vascular markings are normal. Osteopenia and mild thoracic spondylosis. IMPRESSION: Central bronchial thickening consistent with bronchitis. No convincing focal pneumonia. Electronically Signed   By: Francis Quam M.D.   On: 01/09/2024 03:27   DG  Chest 2 View Result Date: 01/08/2024 CLINICAL DATA:  cp EXAM: CHEST - 2 VIEW COMPARISON:  chest radiographs 12/29/2023. FINDINGS: The heart size and mediastinal contours are within normal limits. Both lungs are clear. No visible pleural effusions or pneumothorax. No acute osseous abnormality. IMPRESSION: No active cardiopulmonary disease. Electronically Signed   By: Gilmore GORMAN Molt M.D.   On: 01/08/2024 03:48    {Document cardiac monitor, telemetry assessment procedure when appropriate:32947} Procedures   Medications Ordered in the ED  alum & mag hydroxide-simeth (MAALOX/MYLANTA) 200-200-20 MG/5ML suspension 30 mL (30 mLs Oral Given 01/09/24 0604)  dicyclomine  (BENTYL ) capsule 10 mg (10 mg Oral Given 01/09/24 0604)  famotidine  (PEPCID ) tablet 40 mg (40 mg Oral Given 01/09/24 0604)  oxyCODONE -acetaminophen  (PERCOCET/ROXICET) 5-325 MG per tablet 1 tablet (1 tablet Oral Given 01/09/24 0604)      {Click here for ABCD2, HEART and other calculators REFRESH Note before signing:1}                              Medical Decision Making Risk OTC drugs. Prescription drug management.   This patient presents to the  ED for concern of abdominal pain, this involves an extensive number of treatment options, and is a complaint that carries with it a high risk of complications and morbidity.  The differential diagnosis includes cholecystitis, AAA, appendicitis, renal stone, UTI   Co morbidities that complicate the patient evaluation  Alcohol  abuse, pancreatitis, GERD   Additional history obtained:  Additional history obtained from reviewing today's ED visit 19 2025 in which patient presented with similar findings.  Had labs and chest x-ray as well as EKG done.   Lab Tests:  Declined to have repeat labs time. Has negative lipase and hepatic function panel within 24 hours. Just prior to arrival was seen at [facility] and have one negative troponin.    Imaging Studies ordered:  I ordered imaging studies including ***  I independently visualized and interpreted imaging which showed *** I agree with the radiologist interpretation   Cardiac Monitoring: / EKG:  The patient was maintained on a cardiac monitor.  I personally viewed and interpreted the cardiac monitored which showed an underlying rhythm of: ***   Consultations Obtained:  I requested consultation with the ***,  and discussed lab and imaging findings as well as pertinent plan - they recommend: ***   Problem List / ED Course / Critical interventions / Medication management  *** I ordered medication including ***  for ***  Reevaluation of the patient after these medicines showed that the patient improved I have reviewed the patients home medicines and have made adjustments as needed Patient drank water , tolerating oral intake. Feeling better. Feel appropriate for discharge with outpatient follow up with GI and PCP.    {Document critical care time when appropriate  Document review of labs and clinical decision tools ie CHADS2VASC2, etc  Document your independent review of radiology images and any outside records  Document your discussion  with family members, caretakers and with consultants  Document social determinants of health affecting pt's care  Document your decision making why or why not admission, treatments were needed:32947:::1}   Final diagnoses:  Abdominal pain, LUQ (left upper quadrant)    ED Discharge Orders     None

## 2024-01-09 NOTE — ED Triage Notes (Signed)
 Pt to ED via [facility] from home c/o LUQ/epigastric pain radiating into left and central chest for 2-3 days with n/v.  States recently admitted at [facility] for gastritis.  Hx pancreatitis and WPW.    EMS vitals: 150/100 BP, 85 HR, 85 CBG, 100% RA

## 2024-01-09 NOTE — Discharge Instructions (Signed)
 Please continue your at home pain medicine.  Drink primarily water  and bland foods. Follow-up with primary care and [facility].  Avoid alcohol .  Avoid NSAIDs like ibuprofen , naproxen  or meloxicam.

## 2024-01-14 IMAGING — DX DG CHEST 1V PORT
1 series · 1 of 1 positions shown · non-contrast
Comparison: June 23, 2021.

CLINICAL DATA: Chest pain.

EXAM:
PORTABLE CHEST 1 VIEW

[chest ap]
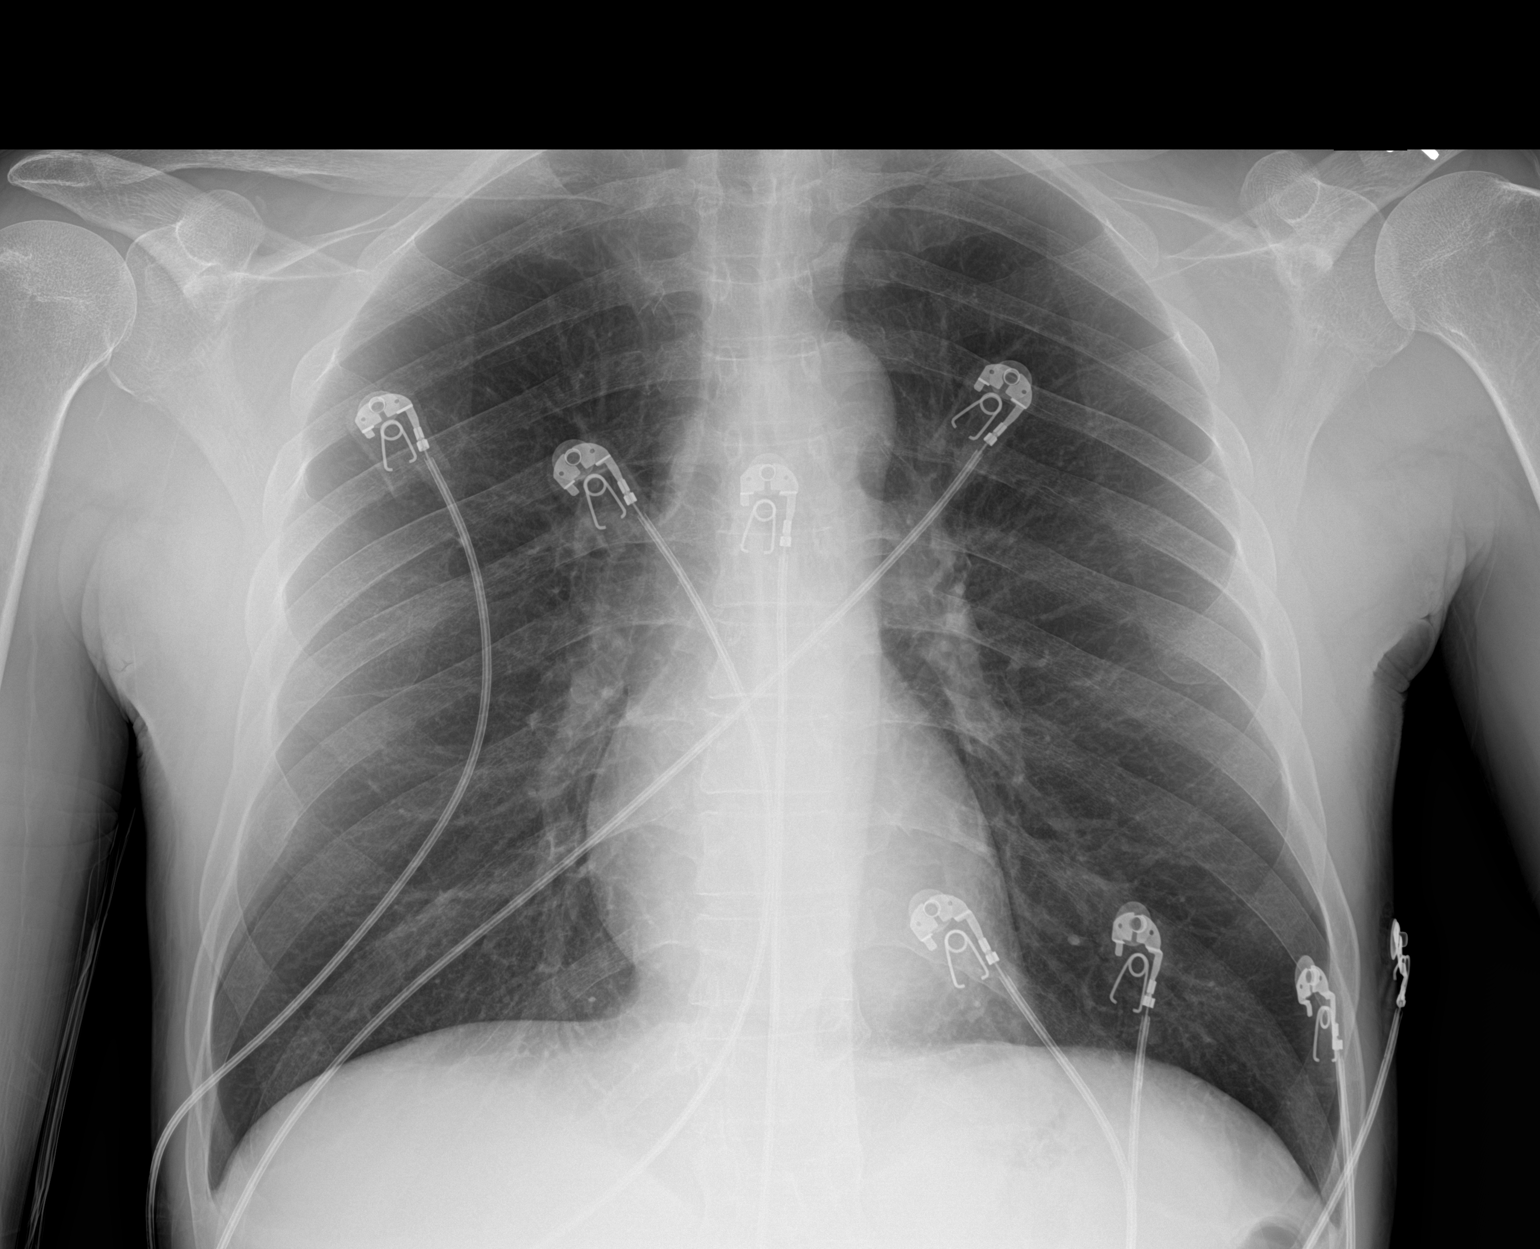

[1 of 1 positions shown; findings below may reference images not displayed]

FINDINGS: No consolidation. No visible pleural effusions or pneumothorax.
Cardiomediastinal silhouette is within normal limits and unchanged.
IMPRESSION: No evidence of acute cardiopulmonary disease.

## 2024-01-18 ENCOUNTER — Other Ambulatory Visit: Payer: Self-pay

## 2024-01-18 ENCOUNTER — Encounter (HOSPITAL_COMMUNITY): Payer: Self-pay | Admitting: Emergency Medicine

## 2024-01-18 ENCOUNTER — Emergency Department (HOSPITAL_COMMUNITY)
Admission: EM | Admit: 2024-01-18 | Discharge: 2024-01-18 | Disposition: A | Discharge status: Home / Self Care | Payer: MEDICAID | Attending: Emergency Medicine | Admitting: Emergency Medicine

## 2024-01-18 DIAGNOSIS — R109 Unspecified abdominal pain: Secondary | ICD-10-CM | POA: Diagnosis present

## 2024-01-18 DIAGNOSIS — R1013 Epigastric pain: Secondary | ICD-10-CM | POA: Diagnosis not present

## 2024-01-18 DIAGNOSIS — R110 Nausea: Secondary | ICD-10-CM | POA: Diagnosis not present

## 2024-01-18 DIAGNOSIS — Z91040 Latex allergy status: Secondary | ICD-10-CM | POA: Insufficient documentation

## 2024-01-18 LAB — CBC WITH DIFFERENTIAL/PLATELET
Abs Immature Granulocytes: 0.03 K/uL (ref 0.00–0.07)
Basophils Absolute: 0 K/uL (ref 0.0–0.1)
Basophils Relative: 0 %
Eosinophils Absolute: 0.1 K/uL (ref 0.0–0.5)
Eosinophils Relative: 2 %
HCT: 38.4 % — ABNORMAL LOW (ref 39.0–52.0)
Hemoglobin: 12.2 g/dL — ABNORMAL LOW (ref 13.0–17.0)
Immature Granulocytes: 0 %
Lymphocytes Relative: 33 %
Lymphs Abs: 2.2 K/uL (ref 0.7–4.0)
MCH: 28.9 pg (ref 26.0–34.0)
MCHC: 31.8 g/dL (ref 30.0–36.0)
MCV: 91 fL (ref 80.0–100.0)
Monocytes Absolute: 0.7 K/uL (ref 0.1–1.0)
Monocytes Relative: 10 %
Neutro Abs: 3.7 K/uL (ref 1.7–7.7)
Neutrophils Relative %: 55 %
Platelets: 231 K/uL (ref 150–400)
RBC: 4.22 MIL/uL (ref 4.22–5.81)
RDW: 17.3 % — ABNORMAL HIGH (ref 11.5–15.5)
WBC: 6.8 K/uL (ref 4.0–10.5)
nRBC: 0 % (ref 0.0–0.2)

## 2024-01-18 LAB — COMPREHENSIVE METABOLIC PANEL WITH GFR
ALT: 30 U/L (ref 0–44)
AST: 74 U/L — ABNORMAL HIGH (ref 15–41)
Albumin: 4.2 g/dL (ref 3.5–5.0)
Alkaline Phosphatase: 78 U/L (ref 38–126)
Anion gap: 20 — ABNORMAL HIGH (ref 5–15)
BUN: 9 mg/dL (ref 6–20)
CO2: 18 mmol/L — ABNORMAL LOW (ref 22–32)
Calcium: 7.7 mg/dL — ABNORMAL LOW (ref 8.9–10.3)
Chloride: 103 mmol/L (ref 98–111)
Creatinine, Ser: 1.04 mg/dL (ref 0.61–1.24)
GFR, Estimated: >60 mL/min (ref >60)
Glucose, Bld: 96 mg/dL (ref 70–99)
Potassium: 4.1 mmol/L (ref 3.5–5.1)
Sodium: 141 mmol/L (ref 135–145)
Total Bilirubin: 0.7 mg/dL (ref 0.0–1.2)
Total Protein: 8 g/dL (ref 6.5–8.1)

## 2024-01-18 LAB — URINALYSIS, ROUTINE W REFLEX MICROSCOPIC
Bilirubin Urine: NEGATIVE
Glucose, UA: NEGATIVE mg/dL
Hgb urine dipstick: NEGATIVE
Ketones, ur: NEGATIVE mg/dL
Leukocytes,Ua: NEGATIVE
Nitrite: NEGATIVE
Protein, ur: NEGATIVE mg/dL
Specific Gravity, Urine: 1.011 (ref 1.005–1.030)
pH: 5 (ref 5.0–8.0)

## 2024-01-18 LAB — LIPASE, BLOOD: Lipase: 26 U/L (ref 11–51)

## 2024-01-18 MED ORDER — ONDANSETRON 4 MG PO TBDP
4.0000 mg | ORAL_TABLET | Freq: Once | ORAL | Status: AC
  Administered 2024-01-18: 4 mg via ORAL
  Filled 2024-01-18: qty 1

## 2024-01-18 MED ORDER — LIDOCAINE VISCOUS HCL 2 % MT SOLN: 15.0000 mL | Freq: Four times a day (QID) | OROMUCOSAL | 0 refills | Status: DC | PRN

## 2024-01-18 MED ORDER — PENICILLIN V POTASSIUM 500 MG PO TABS: 500.0000 mg | ORAL_TABLET | Freq: Four times a day (QID) | ORAL | 0 refills | Status: AC

## 2024-01-18 MED ORDER — HYOSCYAMINE SULFATE 0.125 MG SL SUBL
0.1250 mg | SUBLINGUAL_TABLET | Freq: Once | SUBLINGUAL | Status: AC
  Administered 2024-01-18: 0.125 mg via ORAL
  Filled 2024-01-18: qty 1

## 2024-01-18 MED ORDER — FAMOTIDINE IN NACL 20-0.9 MG/50ML-% IV SOLN
20.0000 mg | Freq: Once | INTRAVENOUS | Status: AC
  Administered 2024-01-18: 20 mg via INTRAVENOUS
  Filled 2024-01-18: qty 50

## 2024-01-18 MED ORDER — PENICILLIN V POTASSIUM 250 MG PO TABS
500.0000 mg | ORAL_TABLET | Freq: Once | ORAL | Status: AC
  Administered 2024-01-18: 500 mg via ORAL
  Filled 2024-01-18: qty 2

## 2024-01-18 MED ORDER — OXYCODONE-ACETAMINOPHEN 5-325 MG PO TABS: 1.0000 | ORAL_TABLET | Freq: Four times a day (QID) | ORAL | 0 refills | Status: DC | PRN

## 2024-01-18 MED ORDER — OXYCODONE-ACETAMINOPHEN 5-325 MG PO TABS
1.0000 | ORAL_TABLET | Freq: Once | ORAL | Status: AC
  Administered 2024-01-18: 1 via ORAL
  Filled 2024-01-18: qty 1

## 2024-01-18 MED ORDER — ALUM & MAG HYDROXIDE-SIMETH 200-200-20 MG/5ML PO SUSP
30.0000 mL | Freq: Once | ORAL | Status: AC
  Administered 2024-01-18: 30 mL via ORAL
  Filled 2024-01-18: qty 30

## 2024-01-18 MED ORDER — SUCRALFATE 1 G PO TABS
1.0000 g | ORAL_TABLET | Freq: Once | ORAL | Status: AC
  Administered 2024-01-18: 1 g via ORAL
  Filled 2024-01-18: qty 1

## 2024-01-18 MED ORDER — LACTATED RINGERS IV BOLUS
1000.0000 mL | Freq: Once | INTRAVENOUS | Status: AC
  Administered 2024-01-18: 1000 mL via INTRAVENOUS

## 2024-01-18 MED ORDER — PANTOPRAZOLE SODIUM 40 MG PO TBEC: 40.0000 mg | DELAYED_RELEASE_TABLET | Freq: Every day | ORAL | 0 refills | Status: DC

## 2024-01-18 MED ORDER — PANTOPRAZOLE SODIUM 40 MG IV SOLR
40.0000 mg | Freq: Once | INTRAVENOUS | Status: AC
  Administered 2024-01-18: 40 mg via INTRAVENOUS
  Filled 2024-01-18: qty 10

## 2024-01-18 NOTE — ED Triage Notes (Signed)
 Pt BIB EMS from home with c/o chest pain and dizziness started x 1 hour pta.

## 2024-01-18 NOTE — ED Notes (Signed)
Attempted labs with no success.

## 2024-01-18 NOTE — ED Provider Triage Note (Signed)
 Emergency Medicine Provider Triage Evaluation Note  [name] , a 53 y.o. male  was evaluated in triage.  Pt complains of abdominal pain that started yesterday.  Feels like his usual episodes of abdominal pain.  Unsure of trigger.  Reports he last drank alcohol  3 days ago and has not had any drugs for 12 days.  Reports he ran out of his Protonix  yesterday but has been using his Carafate , viscous lidocaine  and had 1 tablet of oxycodone  but pain is worsening.  Does sometimes radiate up into his chest but chest pain.  But no persistent chest pain.  Also expresses concern about a dental infection and is supposed to see a dentist tomorrow.  Review of Systems  Positive: Abdominal pain, diarrhea Negative: Fever  Physical Exam  BP (!) 164/116 (BP Location: Right Arm)   Pulse 98   Temp 98.7 F (37.1 C) (Oral)   Resp 18   Ht 5' 8 (1.727 m)   Wt 56.7 kg   SpO2 100%   BMI 19.01 kg/m  Gen:   Awake, no distress   Resp:  Normal effort  MSK:   Moves extremities without difficulty  Other:  Epigastric tenderness with no guarding   Medical Decision Making  Medically screening exam initiated at 7:11 AM.  Appropriate orders placed.  Ayo Aleksandr Pellow was informed that the remainder of the evaluation will be completed by another provider, this initial triage assessment does not replace that evaluation, and the importance of remaining in the ED until their evaluation is complete.  Screening  labs are ordered, seems consistent with chronic abdominal pain   Alva Larraine FALCON, PA-C 01/18/24 9270

## 2024-01-18 NOTE — ED Provider Notes (Signed)
 [place] EMERGENCY DEPARTMENT AT [facility] Provider Note   CSN: [id number] Arrival date & time: 01/18/24  9345     Patient presents with: Chest Pain   [name] is a 53 y.o. male.   [name] is a 53 y.o. male with a history of chronic abdominal pain, chronic pancreatitis, alcohol  and cocaine abuse, who presents to the emergency department for evaluation of abdominal pain that started yesterday.  Feels like his usual episodes of abdominal pain.  Unsure of trigger.  Reports he last drank alcohol  3 days ago and has not had any drugs for 12 days.  Reports he ran out of his Protonix  yesterday but has been using his Carafate , viscous lidocaine  and had 1 tablet of oxycodone  but pain is worsening.  Reports pain sometimes radiates up into his chest but denies persistent chest pain.  Also expresses concern about a dental infection and is supposed to see a dentist tomorrow.no other aggravating or alleviating factors. Reports nausea but no vomiting. No changes in bowels.  The history is provided by the patient and medical records.  Chest Pain Associated symptoms: abdominal pain and nausea   Associated symptoms: no fever and no vomiting        Prior to Admission medications   Medication Sig Start Date End Date Taking? Authorizing Provider  penicillin  v potassium (VEETID) 500 MG tablet Take 1 tablet (500 mg total) by mouth 4 (four) times daily for 7 days. 01/18/24 01/25/24 Yes Wilkes Potvin F, PA-C  acetaminophen  (TYLENOL ) 500 MG tablet Take 1,000 mg by mouth daily as needed for mild pain (pain score 1-3) or moderate pain (pain score 4-6).    [provider]  carvedilol  (COREG ) 25 MG tablet Take 1 tablet (25 mg total) by mouth 2 (two) times daily with a meal. 12/08/23   West, Katlyn D, NP  famotidine  (PEPCID ) 40 MG tablet Take 1 tablet (40 mg total) by mouth daily. Patient taking differently: Take 40 mg by mouth 2 (two) times daily. 10/17/23   Henderly,  Britni A, PA-C  lidocaine  (XYLOCAINE ) 2 % solution Use as directed 15 mLs in the mouth or throat every 6 (six) hours as needed for mouth pain. 01/18/24   Mikhael Hendriks F, PA-C  lipase/protease/amylase (CREON ) 36000 UNITS CPEP capsule Take 2 capsules (72,000 Units total) by mouth 3 (three) times daily with meals. May also take 1 capsule (36,000 Units total) as needed (with snacks). 08/21/23   Ghimire, Donalda HERO, MD  loperamide  (IMODIUM  A-D) 2 MG tablet Take 2 mg by mouth 4 (four) times daily as needed for diarrhea or loose stools.    [provider]  magnesium  oxide (MAG-OX) 400 (240 Mg) MG tablet Take 1 tablet (400 mg total) by mouth daily. 11/07/23   Hoy Nidia FALCON, PA-C  Multiple Vitamin (MULTIVITAMIN WITH MINERALS) TABS tablet Take 1 tablet by mouth daily. Patient taking differently: Take 2 tablets by mouth daily. 08/21/23   Ghimire, Donalda HERO, MD  nicotine  (NICODERM CQ  - DOSED IN MG/24 HOURS) 21 mg/24hr patch Place 1 patch (21 mg total) onto the skin daily. 11/14/23   Amin, Ankit C, MD  ondansetron  (ZOFRAN -ODT) 4 MG disintegrating tablet Take 1 tablet (4 mg total) by mouth every 8 (eight) hours as needed. 11/23/23   Vernon Ranks, MD  oxyCODONE -acetaminophen  (PERCOCET) 5-325 MG tablet Take 1 tablet by mouth every 6 (six) hours as needed for severe pain (pain score 7-10). 01/18/24   Maybree Riling F, PA-C  pantoprazole  (PROTONIX )  40 MG tablet Take 1 tablet (40 mg total) by mouth daily. 01/18/24   Aamiyah Derrick F, PA-C  polyethylene glycol powder (GLYCOLAX /MIRALAX ) 17 GM/SCOOP powder Take 17 g by mouth daily as needed for mild constipation. 11/13/23   Amin, Ankit C, MD  sucralfate  (CARAFATE ) 1 g tablet Take 1 tablet (1 g total) by mouth 3 (three) times daily with meals. 12/29/23   Prosperi, Christian H, PA-C  tamsulosin  (FLOMAX ) 0.4 MG CAPS capsule Take 1 capsule (0.4 mg total) by mouth daily. Patient not taking: Reported on 12/08/2023 08/21/23   Raenelle Donalda HERO, MD  amitriptyline  (ELAVIL ) 25 MG  tablet Take 1 tablet (25 mg total) by mouth at bedtime. Patient not taking: Reported on 08/08/2019 10/15/18 08/08/19  Tobie Yetta HERO, MD    Allergies: Robaxin  [methocarbamol ], Bayer aspirin  [aspirin ], Trazodone  and nefazodone, Adhesive [tape], Latex, Toradol  [ketorolac  tromethamine ], Contrast media [iodinated contrast media], Fish allergy, Reglan  [metoclopramide ], Garnell gums oil], and Shellfish allergy    Review of Systems  Constitutional:  Negative for chills and fever.  HENT: Negative.    Cardiovascular:  Negative for chest pain.  Gastrointestinal:  Positive for abdominal pain and nausea. Negative for constipation, diarrhea and vomiting.  Genitourinary:  Negative for dysuria and frequency.  All other systems reviewed and are negative.   Updated Vital Signs BP (!) 147/115 (BP Location: Right Arm)   Pulse 82   Temp 98.2 F (36.8 C) (Oral)   Resp 18   Ht 5' 8 (1.727 m)   Wt 56.7 kg   SpO2 98%   BMI 19.01 kg/m   Physical Exam Vitals and nursing note reviewed.  Constitutional:      General: He is not in acute distress.    Appearance: Normal appearance. He is well-developed. He is not ill-appearing or diaphoretic.  HENT:     Head: Normocephalic and atraumatic.     Mouth/Throat:     Comments: Mucous membranes are moist, posterior oropharynx is clear.  The right lower molars with some erythema of the surrounding gums but no visible or drainable abscess, some tenderness with percussion over these teeth.  No sublingual tenderness or induration, tolerating secretions without difficulty Eyes:     General:        Right eye: No discharge.        Left eye: No discharge.     Pupils: Pupils are equal, round, and reactive to light.  Cardiovascular:     Rate and Rhythm: Normal rate and regular rhythm.     Pulses: Normal pulses.          Radial pulses are 2+ on the right side and 2+ on the left side.     Heart sounds: Normal heart sounds.  Pulmonary:     Effort: Pulmonary effort is normal.  No respiratory distress.     Breath sounds: Normal breath sounds. No wheezing or rales.     Comments: Respirations equal and unlabored, patient able to speak in full sentences, lungs clear to auscultation bilaterally  Abdominal:     General: Bowel sounds are normal. There is no distension.     Palpations: Abdomen is soft. There is no mass.     Tenderness: There is abdominal tenderness. There is no guarding.     Comments: Abdomen soft, nondistended, bowel sounds present throughout, tenderness in the epigastric and periumbilical regions, no guarding or rebound tenderness, all other quadrants nontender to palpation  Musculoskeletal:        General: No deformity.  Cervical back: Neck supple.  Skin:    General: Skin is warm and dry.     Capillary Refill: Capillary refill takes less than 2 seconds.  Neurological:     Mental Status: He is alert and oriented to person, place, and time.     Coordination: Coordination normal.     Comments: Speech is clear, able to follow commands Moves extremities without ataxia, coordination intact  Psychiatric:        Mood and Affect: Mood normal.        Behavior: Behavior normal.     (all labs ordered are listed, but only abnormal results are displayed) Labs Reviewed  COMPREHENSIVE METABOLIC PANEL WITH GFR - Abnormal; Notable for the following components:      Result Value   CO2 18 (*)    Calcium  7.7 (*)    AST 74 (*)    Anion gap 20 (*)    All other components within normal limits  CBC WITH DIFFERENTIAL/PLATELET - Abnormal; Notable for the following components:   Hemoglobin 12.2 (*)    HCT 38.4 (*)    RDW 17.3 (*)    All other components within normal limits  LIPASE, BLOOD  URINALYSIS, ROUTINE W REFLEX MICROSCOPIC    EKG: EKG Interpretation Date/Time:  Thursday January 18 2024 07:01:27 EDT Ventricular Rate:  93 PR Interval:  102 QRS Duration:  82 QT Interval:  326 QTC Calculation: 405 R Axis:   56  Text Interpretation: Sinus rhythm  with short PR Right atrial enlargement When compared with ECG of 09-Jan-2024 05:05, PREVIOUS ECG IS PRESENT No significant changes from prior tracing Confirmed by Cottie Cough [phone]) on 01/18/2024 12:25:25 PM  Radiology: No results found.   Procedures   Medications Ordered in the ED  ondansetron  (ZOFRAN -ODT) disintegrating tablet 4 mg (4 mg Oral Given 01/18/24 1253)  lactated ringers  bolus 1,000 mL (0 mLs Intravenous Stopped 01/18/24 1441)  pantoprazole  (PROTONIX ) injection 40 mg (40 mg Intravenous Given 01/18/24 1312)  famotidine  (PEPCID ) IVPB 20 mg premix (0 mg Intravenous Stopped 01/18/24 1346)  hyoscyamine  (LEVSIN  SL) SL tablet 0.125 mg (0.125 mg Oral Given 01/18/24 1253)  alum & mag hydroxide-simeth (MAALOX/MYLANTA) 200-200-20 MG/5ML suspension 30 mL (30 mLs Oral Given 01/18/24 1253)  sucralfate  (CARAFATE ) tablet 1 g (1 g Oral Given 01/18/24 1253)  oxyCODONE -acetaminophen  (PERCOCET/ROXICET) 5-325 MG per tablet 1 tablet (1 tablet Oral Given 01/18/24 1312)  penicillin  v potassium (VEETID) tablet 500 mg (500 mg Oral Given 01/18/24 1253)                                    Medical Decision Making Amount and/or Complexity of Data Reviewed Labs: ordered.  Risk OTC drugs. Prescription drug management.   53 year old male well-known to the emergency department presents with abdominal pain he has an underlying history of chronic abdominal pain in the setting of chronic pancreatitis and alcohol  use.  Reports pain feels like his similar episodes, but he is unsure what has triggered it.  Did run out of his Protonix  yesterday but has been taking all other medications like normal.  Vitals are stable patient is in no acute distress will get screening labs and treat symptoms.  Lab evaluation shows some mild dehydration with a slight anion gap of 20 and CO2 of 18, suspect this is in the setting of decreased oral intake and alcohol  use, no leukocytosis, stable hemoglobin, no other electrolyte  derangements, normal renal  and liver function, normal lipase and urinalysis.  EKG shows normal sinus rhythm without ischemic changes.  Patient given 1 L IV fluids he has also been given IV Pepcid  IV Protonix , Maalox, Carafate , Levsin  and Zofran .   Patient with likely local dental infection, no evidence of Ludwig angina. Will treat with penicillin  the patient has been explained that tomorrow.  Patient said given refills of his viscous lidocaine  and Protonix  as well as 3 tablets of pain medication and encouraged to stick to clear liquids over the next 24 hours and then advance diet slowly.  At this time there does not appear to be any evidence of an acute emergency medical condition requiring further emergent evaluation and the patient appears stable for discharge with appropriate outpatient follow up. Diagnosis and return precautions discussed with patient who verbalizes understanding and is agreeable to discharge.        Final diagnoses:  Epigastric abdominal pain    ED Discharge Orders          Ordered    pantoprazole  (PROTONIX ) 40 MG tablet  Daily        01/18/24 1439    lidocaine  (XYLOCAINE ) 2 % solution  Every 6 hours PRN        01/18/24 1439    oxyCODONE -acetaminophen  (PERCOCET) 5-325 MG tablet  Every 6 hours PRN        01/18/24 1439    penicillin  v potassium (VEETID) 500 MG tablet  4 times daily        01/18/24 1449               Alva Larraine FALCON, PA-C 01/24/24 9062    Cottie Donnice PARAS, MD 01/24/24 1140

## 2024-01-18 NOTE — Discharge Instructions (Signed)
 Continue with your daily medications to manage your chronic abdominal pain and chronic pancreatitis.  Your lab work today showed mild dehydration make sure you are drinking water  but otherwise was reassuring.

## 2024-01-20 ENCOUNTER — Emergency Department (HOSPITAL_COMMUNITY)
Admission: EM | Admit: 2024-01-20 | Discharge: 2024-01-20 | Disposition: A | Discharge status: Home / Self Care | Payer: MEDICAID | Attending: Emergency Medicine | Admitting: Emergency Medicine

## 2024-01-20 ENCOUNTER — Other Ambulatory Visit: Payer: Self-pay

## 2024-01-20 ENCOUNTER — Emergency Department (HOSPITAL_COMMUNITY): Payer: MEDICAID

## 2024-01-20 DIAGNOSIS — Z91040 Latex allergy status: Secondary | ICD-10-CM | POA: Diagnosis not present

## 2024-01-20 DIAGNOSIS — J45909 Unspecified asthma, uncomplicated: Secondary | ICD-10-CM | POA: Insufficient documentation

## 2024-01-20 DIAGNOSIS — K047 Periapical abscess without sinus: Secondary | ICD-10-CM | POA: Diagnosis not present

## 2024-01-20 DIAGNOSIS — K860 Alcohol-induced chronic pancreatitis: Secondary | ICD-10-CM | POA: Diagnosis not present

## 2024-01-20 DIAGNOSIS — R1013 Epigastric pain: Secondary | ICD-10-CM | POA: Diagnosis present

## 2024-01-20 DIAGNOSIS — D649 Anemia, unspecified: Secondary | ICD-10-CM | POA: Insufficient documentation

## 2024-01-20 LAB — CBC
HCT: 36.4 % — ABNORMAL LOW (ref 39.0–52.0)
Hemoglobin: 11.2 g/dL — ABNORMAL LOW (ref 13.0–17.0)
MCH: 29 pg (ref 26.0–34.0)
MCHC: 30.8 g/dL (ref 30.0–36.0)
MCV: 94.3 fL (ref 80.0–100.0)
Platelets: 212 K/uL (ref 150–400)
RBC: 3.86 MIL/uL — ABNORMAL LOW (ref 4.22–5.81)
RDW: 17.8 % — ABNORMAL HIGH (ref 11.5–15.5)
WBC: 9.3 K/uL (ref 4.0–10.5)
nRBC: 0 % (ref 0.0–0.2)

## 2024-01-20 LAB — BASIC METABOLIC PANEL WITH GFR
Anion gap: 15 (ref 5–15)
BUN: 5 mg/dL — ABNORMAL LOW (ref 6–20)
CO2: 16 mmol/L — ABNORMAL LOW (ref 22–32)
Calcium: 6.9 mg/dL — ABNORMAL LOW (ref 8.9–10.3)
Chloride: 106 mmol/L (ref 98–111)
Creatinine, Ser: 0.83 mg/dL (ref 0.61–1.24)
GFR, Estimated: >60 mL/min (ref >60)
Glucose, Bld: 79 mg/dL (ref 70–99)
Potassium: 4.1 mmol/L (ref 3.5–5.1)
Sodium: 137 mmol/L (ref 135–145)

## 2024-01-20 LAB — LIPASE, BLOOD: Lipase: 24 U/L (ref 11–51)

## 2024-01-20 LAB — TROPONIN I (HIGH SENSITIVITY)
Troponin I (High Sensitivity): 4 ng/L (ref <18)
Troponin I (High Sensitivity): 4 ng/L (ref <18)

## 2024-01-20 MED ORDER — ONDANSETRON 4 MG PO TBDP
4.0000 mg | ORAL_TABLET | Freq: Once | ORAL | Status: AC
  Administered 2024-01-20: 4 mg via ORAL
  Filled 2024-01-20: qty 1

## 2024-01-20 MED ORDER — OXYCODONE HCL 5 MG PO TABS
10.0000 mg | ORAL_TABLET | Freq: Once | ORAL | Status: AC
  Administered 2024-01-20: 10 mg via ORAL
  Filled 2024-01-20: qty 2

## 2024-01-20 NOTE — Discharge Instructions (Signed)
 Please use Tylenol  for pain.  You may use 1000 mg of Tylenol  every 6 hours.  Not to exceed 4 g of Tylenol  within 24 hours.  Please take your home GI medications, and follow-up closely with the dentist on Monday for further evaluation of your dental infection.

## 2024-01-20 NOTE — ED Triage Notes (Signed)
 CP that started while he was on the bus tonight Hx of WPD.

## 2024-01-20 NOTE — ED Provider Notes (Signed)
 [place] EMERGENCY DEPARTMENT AT [facility] Provider Note   CSN: [id number] Arrival date & time: 01/20/24  0022     Patient presents with: Chest Pain   [name] is a 53 y.o. male with past medical history significant for asthma, cocaine abuse, [employer], GERD, chronic pancreatitis who presents with concern for epigastric abdominal pain, as well as dental pain.  He reports it feels like his chronic pancreatitis.  Taking his home GI medications as directed.  He reports he has been taking penicillin  for the dental infection and has a dentist follow-up on Monday.  Rates the pain 5/10 at this time.    Chest Pain      Prior to Admission medications   Medication Sig Start Date End Date Taking? Authorizing Provider  acetaminophen  (TYLENOL ) 500 MG tablet Take 1,000 mg by mouth daily as needed for mild pain (pain score 1-3) or moderate pain (pain score 4-6).    [provider]  carvedilol  (COREG ) 25 MG tablet Take 1 tablet (25 mg total) by mouth 2 (two) times daily with a meal. 12/08/23   West, Katlyn D, NP  famotidine  (PEPCID ) 40 MG tablet Take 1 tablet (40 mg total) by mouth daily. Patient taking differently: Take 40 mg by mouth 2 (two) times daily. 10/17/23   Henderly, Britni A, PA-C  lidocaine  (XYLOCAINE ) 2 % solution Use as directed 15 mLs in the mouth or throat every 6 (six) hours as needed for mouth pain. 01/18/24   Kehrli, Kelsey F, PA-C  lipase/protease/amylase (CREON ) 36000 UNITS CPEP capsule Take 2 capsules (72,000 Units total) by mouth 3 (three) times daily with meals. May also take 1 capsule (36,000 Units total) as needed (with snacks). 08/21/23   Ghimire, Donalda HERO, MD  loperamide  (IMODIUM  A-D) 2 MG tablet Take 2 mg by mouth 4 (four) times daily as needed for diarrhea or loose stools.    [provider]  magnesium  oxide (MAG-OX) 400 (240 Mg) MG tablet Take 1 tablet (400 mg total) by mouth daily. 11/07/23   Hoy Nidia FALCON, PA-C   Multiple Vitamin (MULTIVITAMIN WITH MINERALS) TABS tablet Take 1 tablet by mouth daily. Patient taking differently: Take 2 tablets by mouth daily. 08/21/23   Ghimire, Donalda HERO, MD  nicotine  (NICODERM CQ  - DOSED IN MG/24 HOURS) 21 mg/24hr patch Place 1 patch (21 mg total) onto the skin daily. 11/14/23   Amin, Ankit C, MD  ondansetron  (ZOFRAN -ODT) 4 MG disintegrating tablet Take 1 tablet (4 mg total) by mouth every 8 (eight) hours as needed. 11/23/23   Vernon Ranks, MD  oxyCODONE -acetaminophen  (PERCOCET) 5-325 MG tablet Take 1 tablet by mouth every 6 (six) hours as needed for severe pain (pain score 7-10). 01/18/24   Alva Larraine FALCON, PA-C  pantoprazole  (PROTONIX ) 40 MG tablet Take 1 tablet (40 mg total) by mouth daily. 01/18/24   Kehrli, Kelsey F, PA-C  penicillin  v potassium (VEETID) 500 MG tablet Take 1 tablet (500 mg total) by mouth 4 (four) times daily for 7 days. 01/18/24 01/25/24  Kehrli, Kelsey F, PA-C  polyethylene glycol powder (GLYCOLAX /MIRALAX ) 17 GM/SCOOP powder Take 17 g by mouth daily as needed for mild constipation. 11/13/23   Amin, Ankit C, MD  sucralfate  (CARAFATE ) 1 g tablet Take 1 tablet (1 g total) by mouth 3 (three) times daily with meals. 12/29/23   Talene Glastetter H, PA-C  tamsulosin  (FLOMAX ) 0.4 MG CAPS capsule Take 1 capsule (0.4 mg total) by mouth daily. Patient not taking: Reported on  12/08/2023 08/21/23   Ghimire, Donalda HERO, MD  amitriptyline  (ELAVIL ) 25 MG tablet Take 1 tablet (25 mg total) by mouth at bedtime. Patient not taking: Reported on 08/08/2019 10/15/18 08/08/19  Tobie Yetta HERO, MD    Allergies: Robaxin  [methocarbamol ], Bayer aspirin  [aspirin ], Trazodone  and nefazodone, Adhesive [tape], Latex, Toradol  [ketorolac  tromethamine ], Contrast media [iodinated contrast media], Fish allergy, Reglan  [metoclopramide ], Garnell gums oil], and Shellfish allergy    Review of Systems  Cardiovascular:  Positive for chest pain.  All other systems reviewed and are negative.   Updated  Vital Signs BP (!) 153/104 (BP Location: Left Arm)   Pulse 88   Temp 98.5 F (36.9 C)   Resp 16   Ht 5' 8 (1.727 m)   Wt 56 kg   SpO2 100%   BMI 18.77 kg/m   Physical Exam Vitals and nursing note reviewed.  Constitutional:      General: He is not in acute distress.    Appearance: Normal appearance.  HENT:     Head: Normocephalic and atraumatic.     Mouth/Throat:     Comments: Broken tooth in mouth on the lower right.  No significant gum swelling, abscess, or purulent drainage Eyes:     General:        Right eye: No discharge.        Left eye: No discharge.  Cardiovascular:     Rate and Rhythm: Normal rate and regular rhythm.     Heart sounds: No murmur heard.    No friction rub. No gallop.  Pulmonary:     Effort: Pulmonary effort is normal. No respiratory distress.     Breath sounds: Normal breath sounds.  Abdominal:     General: Bowel sounds are normal.     Palpations: Abdomen is soft.     Comments: Focal tenderness to palpation epigastric region, no rebound, rigidity, guarding, no abdominal distention  Musculoskeletal:        General: No deformity.  Skin:    General: Skin is warm and dry.     Capillary Refill: Capillary refill takes less than 2 seconds.  Neurological:     Mental Status: He is alert and oriented to person, place, and time.  Psychiatric:        Mood and Affect: Mood normal.        Behavior: Behavior normal.     (all labs ordered are listed, but only abnormal results are displayed) Labs Reviewed  BASIC METABOLIC PANEL WITH GFR - Abnormal; Notable for the following components:      Result Value   CO2 16 (*)    BUN 5 (*)    Calcium  6.9 (*)    All other components within normal limits  CBC - Abnormal; Notable for the following components:   RBC 3.86 (*)    Hemoglobin 11.2 (*)    HCT 36.4 (*)    RDW 17.8 (*)    All other components within normal limits  LIPASE, BLOOD  TROPONIN I (HIGH SENSITIVITY)  TROPONIN I (HIGH SENSITIVITY)     EKG: None  Radiology: DG Chest 2 View Result Date: 01/20/2024 EXAM: 2 VIEW(S) XRAY OF THE CHEST 01/20/2024 12:51:00 AM COMPARISON: 01/09/2024 CLINICAL HISTORY: Chest pain. Encounter for chest pain. FINDINGS: LUNGS AND PLEURA: Stable small calcified granuloma in left upper lung periphery. No pulmonary edema. No pleural effusion. No pneumothorax. HEART AND MEDIASTINUM: No acute abnormality of the cardiac and mediastinal silhouettes. BONES AND SOFT TISSUES: No acute osseous abnormality. IMPRESSION: 1. No acute  cardiopulmonary pathology. Electronically signed by: Dorethia Molt MD 01/20/2024 01:05 AM EDT RP Workstation: [id number]     Procedures   Medications Ordered in the ED  oxyCODONE  (Oxy IR/ROXICODONE ) immediate release tablet 10 mg (has no administration in time range)  ondansetron  (ZOFRAN -ODT) disintegrating tablet 4 mg (has no administration in time range)                                    Medical Decision Making  This patient is a 53 y.o. male  who presents to the ED for concern of abdominal pain, chest pain.   Differential diagnoses prior to evaluation: The emergent differential diagnosis includes, but is not limited to,  The causes of generalized abdominal pain include but are not limited to AAA, mesenteric ischemia, appendicitis, diverticulitis, DKA, gastritis, gastroenteritis, AMI, nephrolithiasis, pancreatitis, peritonitis, adrenal insufficiency,lead poisoning, iron toxicity, intestinal ischemia, constipation, UTI,SBO/LBO, splenic rupture, biliary disease, IBD, IBS, PUD, or hepatitis, PE, Mallory-Weiss, Boerhaave's, Pneumonia, acute bronchitis, asthma or COPD exacerbation, anxiety, MSK pain or traumatic injury to the chest, acid reflux versus other --  . This is not an exhaustive differential.  Overall suspect sequelae of chronic pancreatitis, chronic chest pain in this patient who is seen frequently in the emergency department.  Past Medical History / Co-morbidities /  Social History: asthma, cocaine abuse, [employer], GERD, chronic pancreatitis  Additional history: Chart reviewed. Pertinent results include: extensively reviewed labwork, imaging from recent previous ED visits  Physical Exam: Physical exam performed. The pertinent findings include: Focal tenderness to palpation epigastric region, no rebound, rigidity, guarding, no abdominal distention   Broken tooth in mouth on the lower right.  No significant gum swelling, abscess, or purulent drainage  Elevated blood pressure, 153/104.  Vital signs otherwise stable.  Lab Tests/Imaging studies: I personally interpreted labs/imaging and the pertinent results include: BMP with bicarb deficit, CO2 16, hypocalcemia, otherwise unremarkable, CBC notable for mild anemia, hemoglobin 11.2.  Normal troponin x 2, normal lipase.  Plain film chest x-ray with no evidence of acute intrathoracic abnormality. I agree with the radiologist interpretation.  Cardiac monitoring: EKG obtained and interpreted by myself and attending physician which shows: Normal sinus rhythm, no significant change from baseline   Medications: I ordered medication including oxycodone , Zofran  for pain, nausea.  I have reviewed the patients home medicines and have made adjustments as needed.   Disposition: After consideration of the diagnostic results and the patients response to treatment, I feel that patient's pain and nausea are consistent with his chronic pancreatitis, based on his physical exam, lab work, response to treatment I do not think that he requires any additional workup or imaging today, encourage close follow-up with his primary care doctor, and dentist.  He is still finishing his penicillin , I do not note any evidence of developing Ludwig angina, or worsening soft tissue infection surrounding known broken tooth on lower right.   emergency department workup does not suggest an emergent condition requiring admission or  immediate intervention beyond what has been performed at this time. The plan is: as above. The patient is safe for discharge and has been instructed to return immediately for worsening symptoms, change in symptoms or any other concerns.   Final diagnoses:  Alcohol -induced chronic pancreatitis [facility])  Dental infection    ED Discharge Orders     None          [name], [place] 01/20/24 [phone]  Towana Ozell BROCKS, MD 01/20/24 1700

## 2024-01-25 NOTE — ED Notes (Signed)
  ED Attending Attestation Note   I supervised care provided by the resident. We have discussed the case, I have reviewed the note and I agree with the plan of treatment except as documented in my note.  I have personally performed a face-to-face diagnostic evaluation on this patient.    ED Attending Note    [name] is a 53 y.o. male with PMH of WPW, substance use, EtOH withdrawal, chronic pancreatitis, HTN, anxiety, depression, bipolar disorder presenting for evaluation of 2 hours of epigastric pain radiating to chest associated with nausea and vomiting. Symptoms consistent with prior episodes of pancreatitis though he reports more severe. No melena, hematemesis. Exam with TTP in epigastric region and LUQ. Symptoms began after having some dental work earlier today. His last drink was yesterday and he reports using cocaine 4 days ago. Exam with epigastric and LUQ TTP. Ddx includes gastritis or PUD, perforation, recurrent pancreatitis or complication thereof, alcoholic hepatitis, atypical ACS. ECG with TWI in lateral leads new from prior though fortunately no dynamic changes and troponins are negative. Does have low mag so will replete that here. Will treat with pain and nausea medications while CT is pending. Signed out with imaging pending and suspect he will require admission for symptom control.   See chart and resident documentation for details.  Additional Medical Decision Making   I have reviewed the patient's labs & imaging results which were available during my care of the patient.  I independently interpreted the EKG tracing.  I independently reviewed/interpreted the radiology images.  I reviewed the patient's prior medical records    Pertinent labs & imaging results which were available during my care of the patient were reviewed by me. See chart and resident documentation for details.The patient was informed of their results.    Final diagnoses:  Abdominal pain,  unspecified abdominal location (Primary)  Chronic pancreatitis, unspecified pancreatitis type    (CMS-HCC)  Chronic gastritis without bleeding, unspecified gastritis type    Jason GORMAN Chancy, MD  January 25, 2024 8:46 AM

## 2024-01-26 ENCOUNTER — Telehealth: Payer: Self-pay | Admitting: Gastroenterology

## 2024-01-26 NOTE — Telephone Encounter (Signed)
 Inbound call from patient requesting to know when we can schedule him for another Upper Endoscopy due to the last one being unsuccessful.  Patient stated that he is also wondering if Dr. Leigh will prescribed him a refill on his Creon  and for a GI coacktail. Patient stated that he does us  the pharmacy [facility] on [address] in [place]. Please advise.

## 2024-01-26 NOTE — Telephone Encounter (Signed)
 Per chart pt was discharged from the practice in March. He will need to be seen by another GI practice.

## 2024-01-26 NOTE — Telephone Encounter (Signed)
 Patient appears to have been dismissed from GI 07/14/23 and will need to seek care elsewhere as per the letter sent to him.

## 2024-01-27 IMAGING — DX DG CHEST 2V
2 series · 2 of 2 positions shown · non-contrast
Comparison: Chest x-ray 07/24/2021, CT chest 11/26/2014

CLINICAL DATA: chest pain

EXAM:
CHEST - 2 VIEW

[chest pa]
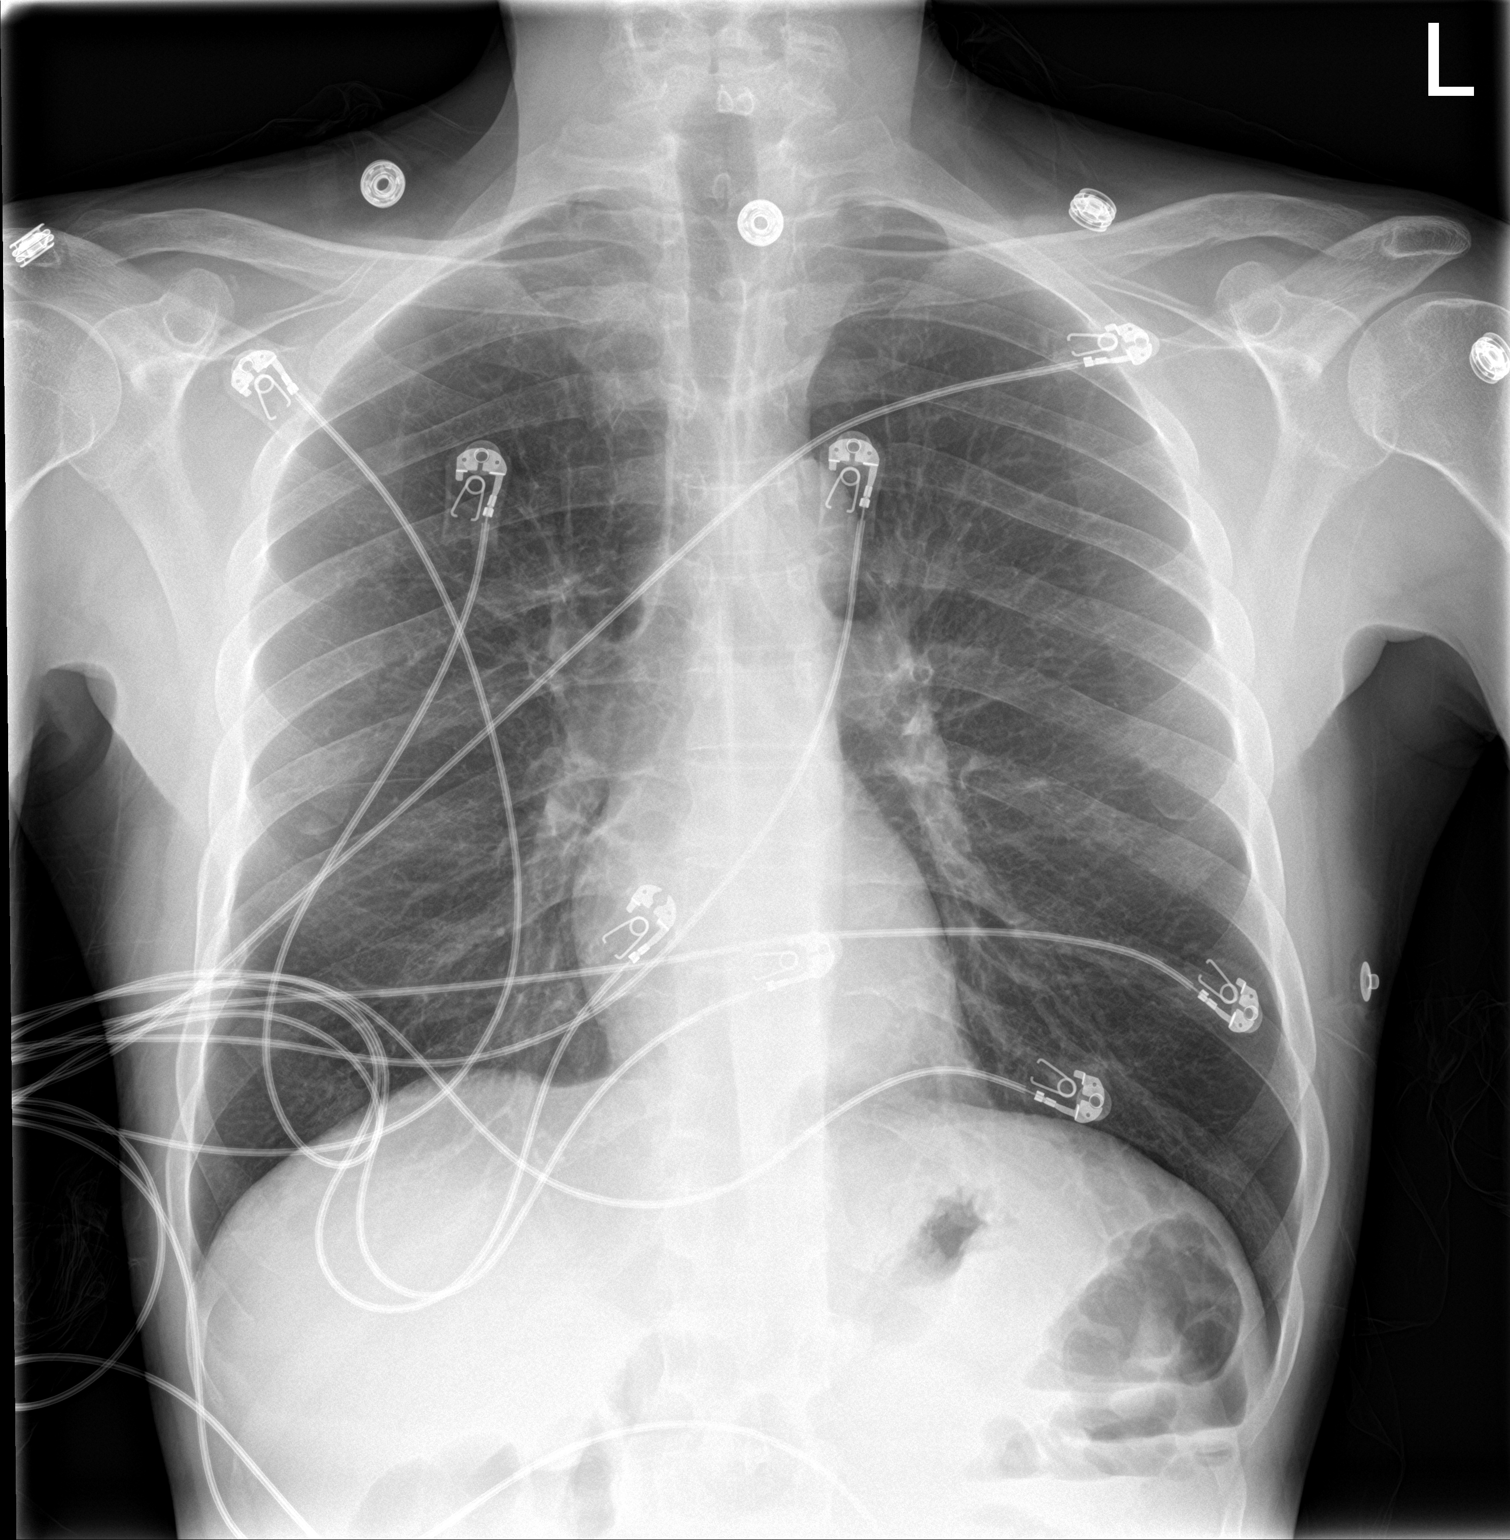

[chest lat]
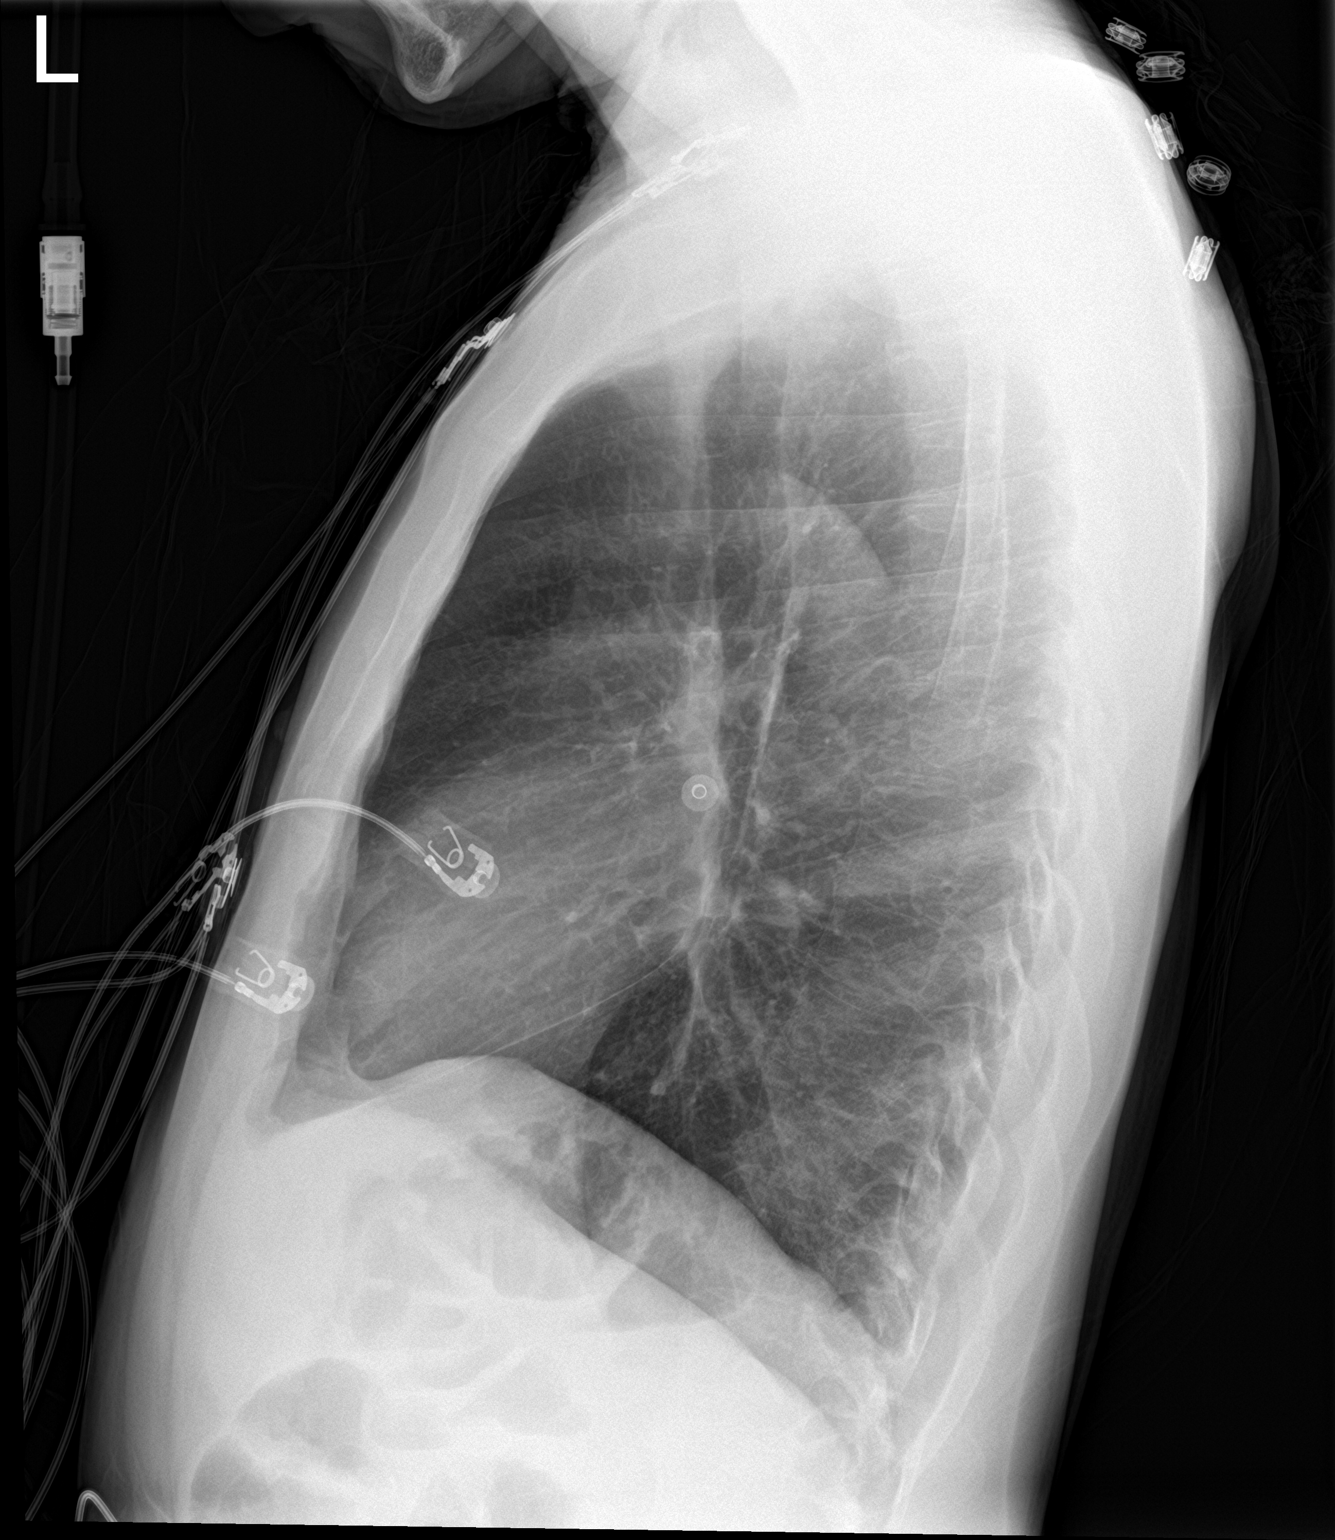

[2 of 2 positions shown; findings below may reference images not displayed]

FINDINGS: The heart and mediastinal contours are within normal limits.

Biapical pleural/pulmonary scarring emphysematous changes. No focal
consolidation. No pulmonary edema. No pleural effusion. No
pneumothorax.

No acute osseous abnormality.
IMPRESSION: 1. No active cardiopulmonary disease.
2.  Emphysema ([id number]).

## 2024-02-01 ENCOUNTER — Other Ambulatory Visit (INDEPENDENT_AMBULATORY_CARE_PROVIDER_SITE_OTHER): Payer: Self-pay | Admitting: Primary Care

## 2024-02-03 NOTE — Progress Notes (Deleted)
 Cardiology Office Note    Date:  02/03/2024  ID:  Jason, Moran 04-11-1971, MRN [id number] PCP:  Celestia Rosaline SQUIBB, NP  Cardiologist:  Lynwood Schilling, MD  Electrophysiologist:  None   Chief Complaint: ***  History of Present Illness: .    [name] is a 53 y.o. 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  Per patient's prior reports he underwent WPW ablation in 2014/2015 although unclear as to where, patient has previously reported this took place at [facility] however there is no documentation of this procedure.  Nuclear stress test on 01/12/2022 findings consistent with no prior ischemia, study was considered intermediate risk as LVEF function was abnormal, EF 47%.  Follow-up limited echo on 02/16/2022 indicated LVEF 65 to 70%.  No RWMA, mild LVH.  Per notes patient also has history of paroxysmal atrial fibrillation however has not been started anticoagulation due to low CHA2DS2-VASc and ongoing alcohol  use.  On 11/10/2023 cardiology visual insulted after patient presented to the ED.  Per notes patient was walking in his house when he suddenly collapsed and passed out, denied any warning signs of dizziness or palpitations, unknown duration of syncope.  After he woke up he called 9 1 and was told to call again again if he had any issues.  Patient started having chest pain, stent pain and dizziness, called 911 again and was brought to the ER.  EKG showed short PR interval, sinus tachycardia, HR 100 bpm, no ischemia, hide since troponins were normal.  BP upon arrival was around 200 mmHg however patient reported readings between 120-130 mmHg at home.  It was questioned if patient carry diagnosis of WPW, noted that there was no record of an ablation  completed at [facility], recommended live Zio patch for 2 weeks on discharge, patient was transition from metoprolol  to carvedilol  given history of cocaine use.  Echocardiogram on 11/10/2023 indicated LVEF of 70 to 75%, no RWMA, G1 DD, RV systolic function and size was normal, mildly elevated pulmonary artery systolic pressures, no significant valvular abnormalities.  Patient was discharged on 11/13/2023 with cardiac monitor.  Patient was seen in the ED on 7/11 for evaluation of chest pain, epigastric abdominal discomfort, nausea.  High sensitive troponin negative x 2.  Lipase normal.  He was started on sucralfate  and discharged in the ED.  Patient again admitted 7/15 through 7/17 after he presented with chest pain and abdominal pain.  Patient also reported diarrhea and vomiting, patient reported feeling as though he had recurrent pancreatitis.  Hs troponin negative.  Patient had relief with GI cocktail.  Patient seen in the ED on 7/22 with abdominal pain, felt like his pancreatitis pain.  EKG nonischemic.  High sensitive troponin negative x 2.  Suspected that he had an exacerbation of his chronic pancreatitis/chronic pain.  He was given 10 tablets of Percocet, per notes he has previously been discharged from [facility] medical pain management due to positive drug screens.  Patient seen in the ED on 7/26 with right scapular pain.  EKG was without acute ischemia.  Troponin negative.  Presented to the ED 7/27 after he had an episode of vomiting and developed chest pain after vomiting.  EMS reported that they found him in A-fib with RVR, heart rate in the 190s to 220s.  Was  given amiodarone  with improvement in heart rate.  In the ED initial vital signs showed heart rate 155 bpm, oxygen  100% on room air, BP 121/91.  Initial EKG showed atrial fibrillation with heart rate 157 bpm.  Labs significant for K3.4, mag 1.1, hemoglobin 10.9.  Hstn 6>27. CXR showed active disease.  Patient was given IV procainamide , IV magnesium .  He  converted to normal sinus rhythm and cardiology was consulted.  Patient reported that he had been feeling nauseous the prior day, he tried to eat something and his nausea worsened then started to vomit.  He developed palpitations and a squeezing feeling in his chest.  Patient was afraid he would pass out if he did not call EMS.  He converted to normal sinus rhythm while in the ED.  Following that he had one-two episodes of palpitations overnight.  Patient continued to have some dizziness when he was moving his head or standing up too quickly.  Patient continued to endorse the squeezing feeling in his chest, was reproducible on palpation, patient also reported that it worsened if he ate or would take deep breaths.  Patient's cardiac monitor showed rhythm was normal sinus, there were occasional runs of supraventricular cardia, longest lasting 7 beats, there was some wide-complex tachycardia that could have been SVT aberrancy however could not exclude nonsustained ventricular tachycardia lasting 7 beats.  On chart review [facility] 8/8, 8/13, and 8/22 with complaints of abdominal and chest pain, troponins each time negative.  Noted to have abdominal tenderness, all felt to be related to his history of chronic pancreatitis and ongoing alcohol  use and likely gastritis.  Patient presented to [facility] on 01/05/2024 with chest and abdominal pain, per notes there was concern for acute pancreatitis versus acute cardiac event, patient was given GI cocktail, UDS positive for cocaine.  Patient was discharged on 01/07/2024 with reassuring workup.  Patient again presented to [name] ED on 9/1 with abdominal pain and chest pain, patient believes that he was having withdrawal symptoms as he started seeing black spots, drank a portion of beer and then started vomiting. 1, 1  Admitted to [facility] again on 9/16 discharged 9/18   Today he presents for follow-up.  He reports that he  Patient  with numerous ED visit between [facility] and [facility] for recurrent abdominal pain     Paroxysmal atrial fibrillation/?WPW: Patient has a reported history of WPW and reports undergoing ablation in 2014.  Recently this diagnosis has been questioned, there is no records of ablation available in chart.  EKGs over the years have shown short PR interval, no delta waves. Patient admitted in 11/2023 with chest pain, vomiting, found to be in A-fib with RVR, K3.4 mag 1.1, given IV procainamide  and IV magnesium  and converted to normal sinus rhythm.  It was felt that A-fib was driven by low magnesium  and potassium in setting of vomiting, diarrhea and ongoing alcohol  use.   Patient has not been started on [facility] due to low CHA2DS2-VASc as well as ongoing alcohol  abuse.  Patient has returned ZIO monitor, results are currently in review as noted on ZIO suite.   Patient continues to endorse intermittent palpitations described a slight fluttering sensation.   He reports that he has been taking all medications as prescribed, reports that he does continue to consume 3 12 ounce alcoholic beverages a day, denies any cocaine use in the last 2 weeks.  Chest pain: At last admission patient reported a  squeezing feeling in his chest, worsens with deep breaths and eating, reproducible on palpation.  High sensitive troponin 6>27>20.  Mild elevation in flat trend, not consistent with ACS.  Suspected to be from demand ischemia from A-fib with RVR.  CTA chest on 11/21/2023 showed no coronary artery calcifications, echo from 7/4 showed EF 7075%, no wall motion abnormalities patient continues to endorse a left-sided chest pain that is intermittent, typically associated with eating and is reproducible on palpation today, question if related to his history of chronic pancreatitis.  Overall atypical for cardiac chest pain. Patient previously noted to not to be a good candidate for cath/ischemic evaluation given ongoing alcohol  and cocaine use.  No  further cardiac workup planned at this time.  Hypertension: Blood pressure today   Continue carvedilol  25 mg twice daily.    Labwork independently reviewed:   ROS: .   *** denies chest pain, shortness of breath, lower extremity edema, fatigue, palpitations, melena, hematuria, hemoptysis, diaphoresis, weakness, presyncope, syncope, orthopnea, and PND.  All other systems are reviewed and otherwise negative.  Studies Reviewed: SABRA    EKG:  EKG is ordered today, personally reviewed, demonstrating ***     CV Studies: Cardiac studies reviewed are outlined and summarized above. Otherwise please see EMR for full report. Cardiac Studies & Procedures   ______________________________________________________________________________________________   STRESS TESTS  MYOCARDIAL PERFUSION IMAGING 01/12/2022  Interpretation Summary   Findings are consistent with no prior ischemia. The study is intermediate risk.   No ST deviation was noted.   LV perfusion is normal.   Left ventricular function is abnormal. Global function is mildly reduced. Nuclear stress EF: 47 %. The left ventricular ejection fraction is mildly decreased (45-54%). End diastolic cavity size is normal. End systolic cavity size is normal.   Prior study available for comparison from 10/03/2012. There are changes compared to prior study. The left ventricular ejection fraction has decreased. No ischemia. LVEF 67%  No reversible ischemia. LVEF 47% with global hypokinesis. This is an intermediate risk study. Compared to a prior study in 2014, the LVEF has decreased.   ECHOCARDIOGRAM  ECHOCARDIOGRAM COMPLETE 11/10/2023  Narrative ECHOCARDIOGRAM REPORT    Patient Name:   [name] Date of Exam: 11/10/2023 Medical Rec #:  [id number]              Height:       68.0 in Accession #:    [id number]             Weight:       122.7 lb Date of Birth:  1970/11/14               BSA:          1.661 m Patient Age:    53 years                BP:           203/112 mmHg Patient Gender: M                      HR:           76 bpm. Exam Location:  Inpatient  Procedure: 2D Echo (Both Spectral and Color Flow Doppler were utilized during procedure).  Indications:    syncope  History:        Patient has prior history of Echocardiogram examinations, most recent 02/16/2022. Arrythmias:WPW; Risk Factors:Hypertension, Current Smoker and polysubstance abuse.  Sonographer:    Tinnie Barefoot RDCS Referring Phys:  [id number] ALEXANDER B MELVIN  IMPRESSIONS   1. Left ventricular ejection fraction, by estimation, is 70 to 75%. The left ventricle has hyperdynamic function. The left ventricle has no regional wall motion abnormalities. Left ventricular diastolic parameters are consistent with Grade I diastolic dysfunction (impaired relaxation). 2. Right ventricular systolic function is normal. The right ventricular size is normal. There is mildly elevated pulmonary artery systolic pressure. The estimated right ventricular systolic pressure is 44.0 mmHg. 3. The mitral valve is normal in structure. No evidence of mitral valve regurgitation. No evidence of mitral stenosis. 4. The aortic valve has an indeterminant number of cusps. Aortic valve regurgitation is not visualized. No aortic stenosis is present. 5. The inferior vena cava is normal in size with <50% respiratory variability, suggesting right atrial pressure of 8 mmHg.  Comparison(s): No significant change from prior study.  FINDINGS Left Ventricle: Left ventricular ejection fraction, by estimation, is 70 to 75%. The left ventricle has hyperdynamic function. The left ventricle has no regional wall motion abnormalities. Strain was performed and the global longitudinal strain is indeterminate. The left ventricular internal cavity size was normal in size. There is no left ventricular hypertrophy. Left ventricular diastolic parameters are consistent with Grade I diastolic dysfunction (impaired  relaxation).  Right Ventricle: The right ventricular size is normal. No increase in right ventricular wall thickness. Right ventricular systolic function is normal. There is mildly elevated pulmonary artery systolic pressure. The tricuspid regurgitant velocity is 3.00 m/s, and with an assumed right atrial pressure of 8 mmHg, the estimated right ventricular systolic pressure is 44.0 mmHg.  Left Atrium: Left atrial size was normal in size.  Right Atrium: Right atrial size was normal in size.  Pericardium: There is no evidence of pericardial effusion.  Mitral Valve: The mitral valve is normal in structure. No evidence of mitral valve regurgitation. No evidence of mitral valve stenosis.  Tricuspid Valve: The tricuspid valve is normal in structure. Tricuspid valve regurgitation is trivial. No evidence of tricuspid stenosis.  Aortic Valve: The aortic valve has an indeterminant number of cusps. Aortic valve regurgitation is not visualized. No aortic stenosis is present.  Pulmonic Valve: The pulmonic valve was not well visualized. Pulmonic valve regurgitation is not visualized. No evidence of pulmonic stenosis.  Aorta: The aortic root and ascending aorta are structurally normal, with no evidence of dilitation.  Venous: The inferior vena cava is normal in size with less than 50% respiratory variability, suggesting right atrial pressure of 8 mmHg.  IAS/Shunts: No atrial level shunt detected by color flow Doppler.  Additional Comments: 3D was performed not requiring image post processing on an independent workstation and was indeterminate.   LEFT VENTRICLE PLAX 2D LVIDd:         4.40 cm   Diastology LVIDs:         2.60 cm   LV e' medial:    11.00 cm/s LV PW:         0.80 cm   LV E/e' medial:  11.1 LV IVS:        0.60 cm   LV e' lateral:   12.60 cm/s LVOT diam:     1.90 cm   LV E/e' lateral: 9.7 LV SV:         58 LV SV Index:   35 LVOT Area:     2.84 cm   RIGHT VENTRICLE              IVC RV Basal diam:  2.50 cm  IVC diam: 2.00 cm RV S prime:     14.80 cm/s TAPSE (M-mode): 1.8 cm  LEFT ATRIUM             Index        RIGHT ATRIUM          Index LA diam:        3.00 cm 1.81 cm/m   RA Area:     9.63 cm LA Vol (A2C):   37.3 ml 22.46 ml/m  RA Volume:   19.90 ml 11.98 ml/m LA Vol (A4C):   31.1 ml 18.73 ml/m LA Biplane Vol: 34.4 ml 20.72 ml/m AORTIC VALVE LVOT Vmax:   117.00 cm/s LVOT Vmean:  77.300 cm/s LVOT VTI:    0.206 m  AORTA Ao Root diam: 2.70 cm Ao Asc diam:  2.60 cm  MITRAL VALVE                TRICUSPID VALVE MV Area (PHT): 5.02 cm     TR Peak grad:   36.0 mmHg MV Decel Time: 151 msec     TR Vmax:        300.00 cm/s MV E velocity: 122.00 cm/s MV A velocity: 96.30 cm/s   SHUNTS MV E/A ratio:  1.27         Systemic VTI:  0.21 m Systemic Diam: 1.90 cm  Vishnu Priya Mallipeddi Electronically signed by Diannah Late Mallipeddi Signature Date/Time: 11/10/2023/4:55:23 PM    Final    MONITORS  LONG TERM MONITOR-LIVE TELEMETRY (3-14 DAYS) 12/12/2023  Narrative Rhythm was normal sinus. There were occasional runs of supraventricular tachycardia with the longest lasting 7 beats. There was some wide-complex tachycardia which could have been SVT aberrancy.  However, could not exclude nonsustained ventricular tachycardia lasting 7 beats.       ______________________________________________________________________________________________       Current Reported Medications:.    No outpatient medications have been marked as taking for the 02/06/24 encounter (Appointment) with Samika Vetsch D, NP.    Physical Exam:    VS:  There were no vitals taken for this visit.   Wt Readings from Last 3 Encounters:  01/20/24 123 lb 7.3 oz (56 kg)  01/18/24 125 lb (56.7 kg)  01/09/24 125 lb (56.7 kg)    GEN: Well nourished, well developed in no acute distress NECK: No JVD; No carotid bruits CARDIAC: ***RRR, no murmurs, rubs, gallops RESPIRATORY:  Clear to  auscultation without rales, wheezing or rhonchi  ABDOMEN: Soft, non-tender, non-distended EXTREMITIES:  No edema; No acute deformity     Asessement and Plan:.     ***     Disposition: F/u with ***  Signed, Verle Brillhart D Floyd Lusignan, NP

## 2024-02-04 ENCOUNTER — Other Ambulatory Visit: Payer: Self-pay

## 2024-02-04 ENCOUNTER — Emergency Department (HOSPITAL_COMMUNITY)
Admission: EM | Admit: 2024-02-04 | Discharge: 2024-02-04 | Disposition: A | Discharge status: Home / Self Care | Payer: MEDICAID | Attending: Emergency Medicine | Admitting: Emergency Medicine

## 2024-02-04 ENCOUNTER — Emergency Department (HOSPITAL_COMMUNITY): Payer: MEDICAID

## 2024-02-04 DIAGNOSIS — Z91040 Latex allergy status: Secondary | ICD-10-CM | POA: Diagnosis not present

## 2024-02-04 DIAGNOSIS — R197 Diarrhea, unspecified: Secondary | ICD-10-CM | POA: Insufficient documentation

## 2024-02-04 DIAGNOSIS — I10 Essential (primary) hypertension: Secondary | ICD-10-CM | POA: Insufficient documentation

## 2024-02-04 DIAGNOSIS — J45909 Unspecified asthma, uncomplicated: Secondary | ICD-10-CM | POA: Diagnosis not present

## 2024-02-04 DIAGNOSIS — R42 Dizziness and giddiness: Secondary | ICD-10-CM | POA: Diagnosis not present

## 2024-02-04 DIAGNOSIS — R1110 Vomiting, unspecified: Secondary | ICD-10-CM | POA: Insufficient documentation

## 2024-02-04 DIAGNOSIS — R1013 Epigastric pain: Secondary | ICD-10-CM | POA: Diagnosis present

## 2024-02-04 LAB — HEPATIC FUNCTION PANEL
ALT: 23 U/L (ref 0–44)
AST: 46 U/L — ABNORMAL HIGH (ref 15–41)
Albumin: 3.5 g/dL (ref 3.5–5.0)
Alkaline Phosphatase: 66 U/L (ref 38–126)
Bilirubin, Direct: 0.3 mg/dL — ABNORMAL HIGH (ref 0.0–0.2)
Indirect Bilirubin: 0.5 mg/dL (ref 0.3–0.9)
Total Bilirubin: 0.8 mg/dL (ref 0.0–1.2)
Total Protein: 6.9 g/dL (ref 6.5–8.1)

## 2024-02-04 LAB — CBC
HCT: 36.5 % — ABNORMAL LOW (ref 39.0–52.0)
Hemoglobin: 11.7 g/dL — ABNORMAL LOW (ref 13.0–17.0)
MCH: 30.1 pg (ref 26.0–34.0)
MCHC: 32.1 g/dL (ref 30.0–36.0)
MCV: 93.8 fL (ref 80.0–100.0)
Platelets: 219 K/uL (ref 150–400)
RBC: 3.89 MIL/uL — ABNORMAL LOW (ref 4.22–5.81)
RDW: 20.5 % — ABNORMAL HIGH (ref 11.5–15.5)
WBC: 8.6 K/uL (ref 4.0–10.5)
nRBC: 0 % (ref 0.0–0.2)

## 2024-02-04 LAB — BASIC METABOLIC PANEL WITH GFR
Anion gap: 13 (ref 5–15)
BUN: 8 mg/dL (ref 6–20)
CO2: 19 mmol/L — ABNORMAL LOW (ref 22–32)
Calcium: 9.1 mg/dL (ref 8.9–10.3)
Chloride: 107 mmol/L (ref 98–111)
Creatinine, Ser: 0.88 mg/dL (ref 0.61–1.24)
GFR, Estimated: >60 mL/min (ref >60)
Glucose, Bld: 87 mg/dL (ref 70–99)
Potassium: 4.4 mmol/L (ref 3.5–5.1)
Sodium: 139 mmol/L (ref 135–145)

## 2024-02-04 LAB — TROPONIN I (HIGH SENSITIVITY)
Troponin I (High Sensitivity): 4 ng/L (ref <18)
Troponin I (High Sensitivity): 3 ng/L (ref <18)

## 2024-02-04 LAB — LIPASE, BLOOD: Lipase: 13 U/L (ref 11–51)

## 2024-02-04 IMAGING — CT CT ABD-PELV W/O CM
2 of 4 series · 13 of 46 positions shown, 15 images · non-contrast
Comparison: 08/07/2021

CLINICAL DATA: Pancreatitis.



[Series 3: ap without · axial · non-contrast · 0.56mm/px · z∈[+1259,+1634]mm · 10 of 85 slices shown, 12 images]
[im 5/85  soft-tissue]
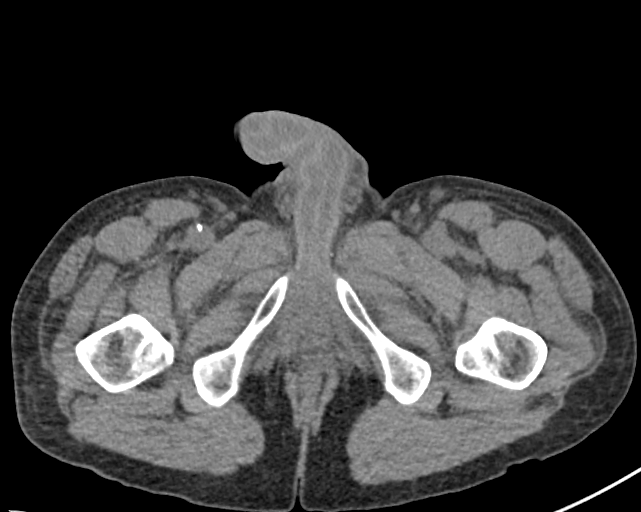
[im 5/85  bone]
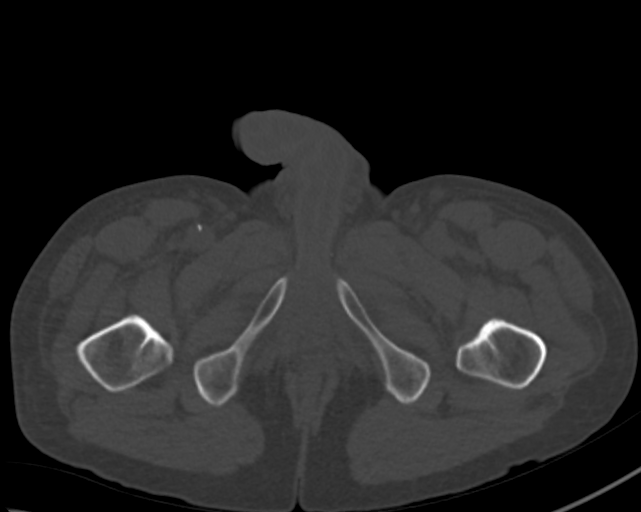
[im 14/85  soft-tissue]
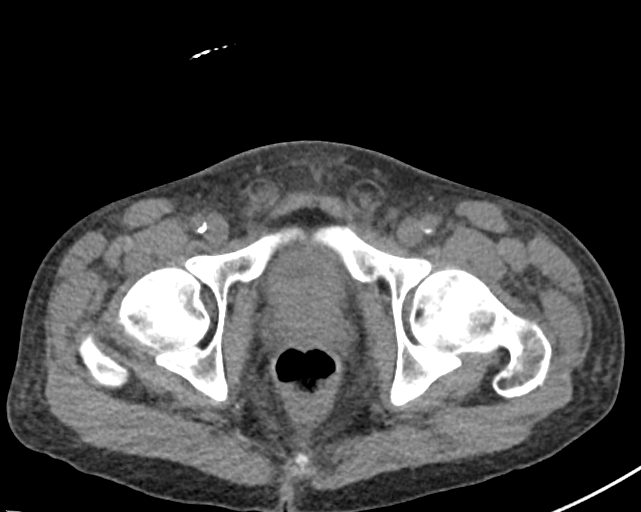
[im 23/85  soft-tissue]
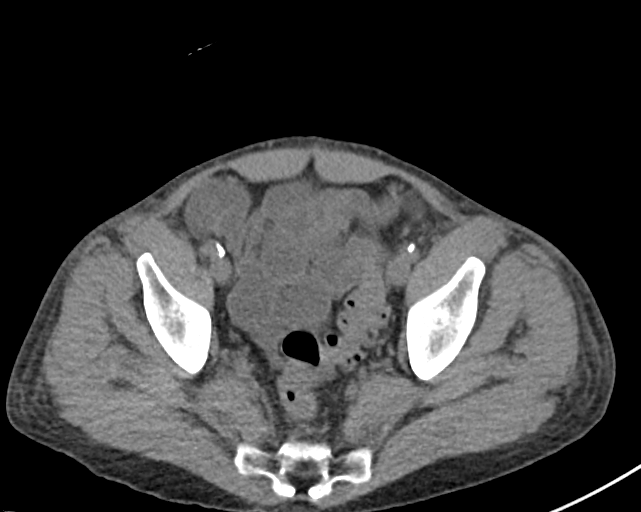
[im 31/85  soft-tissue]
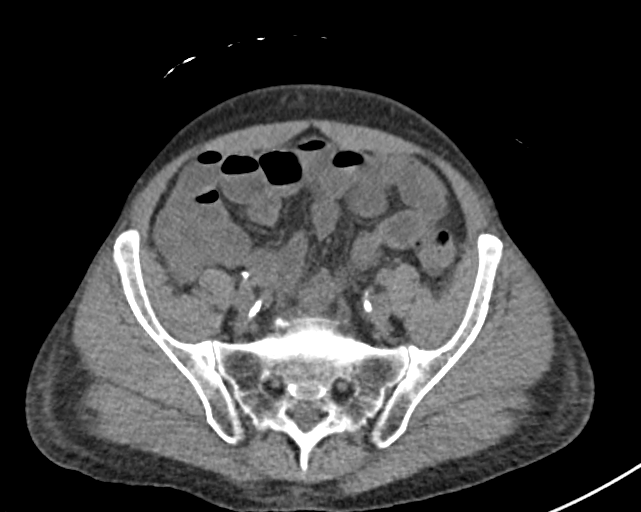
[im 40/85  soft-tissue]
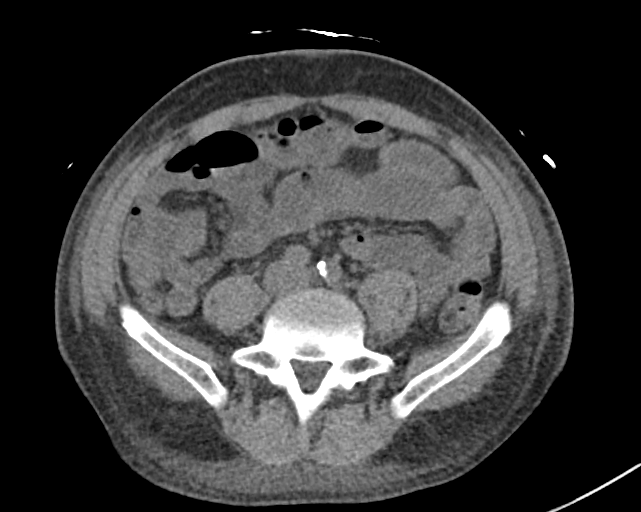
[im 45/85  soft-tissue]
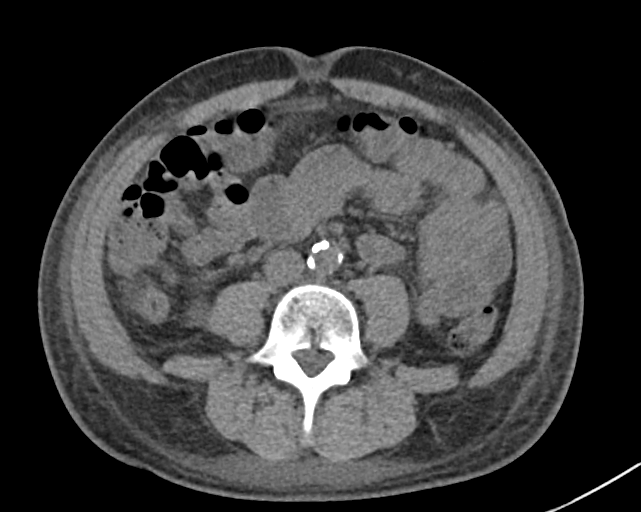
[im 54/85  soft-tissue]
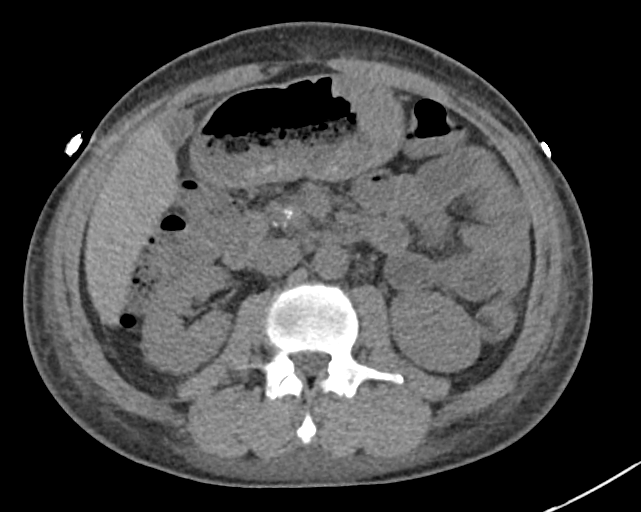
[im 62/85  soft-tissue]
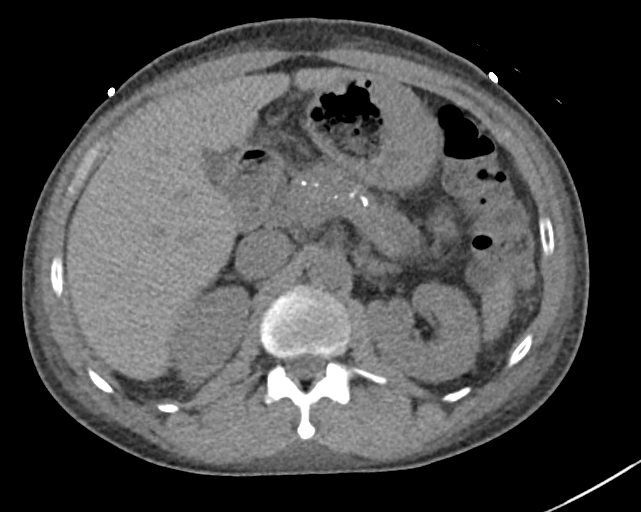
[im 71/85  soft-tissue]
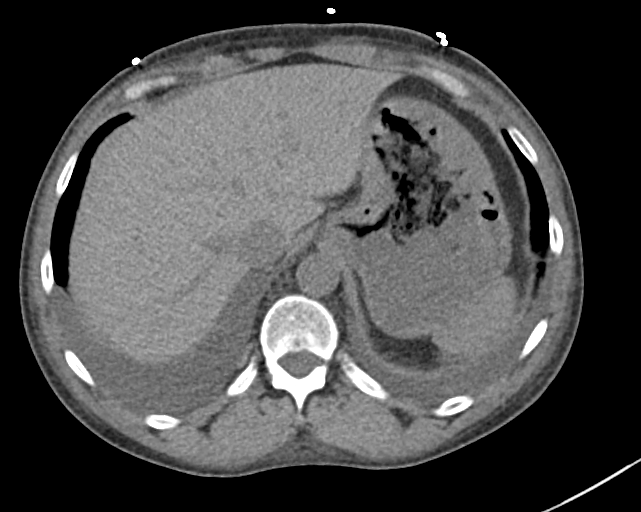
[im 71/85  bone]
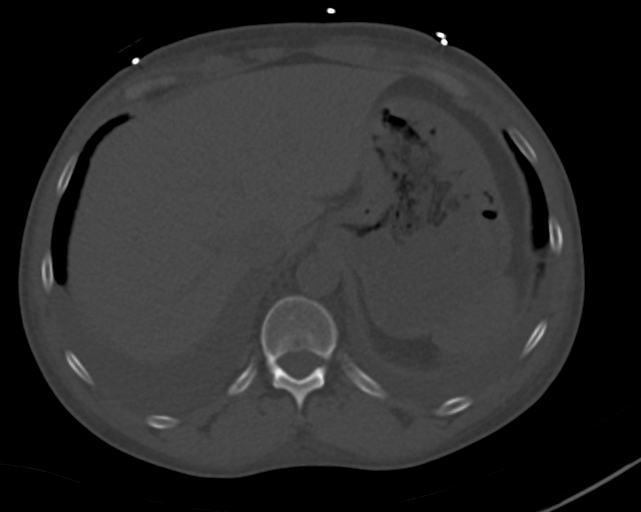
[im 80/85  soft-tissue]
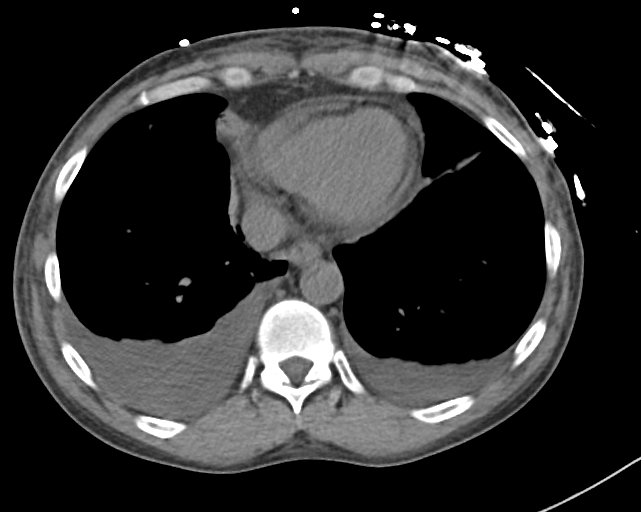

[Series 6: cor · coronal · 0.70mm/px · 3 of 96 slices shown]
[im 32/96  soft-tissue]
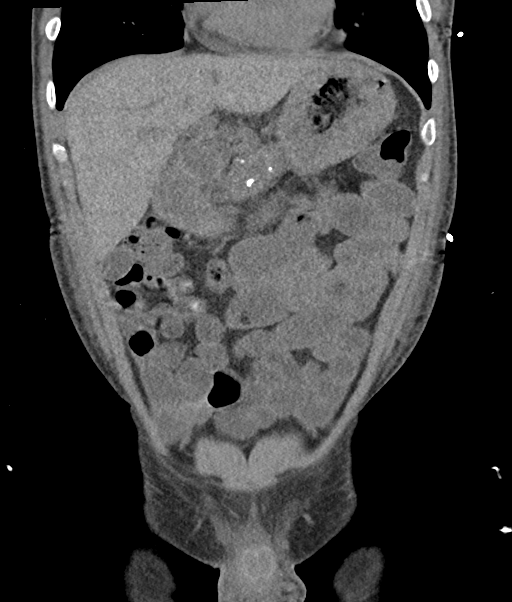
[im 43/96  soft-tissue]
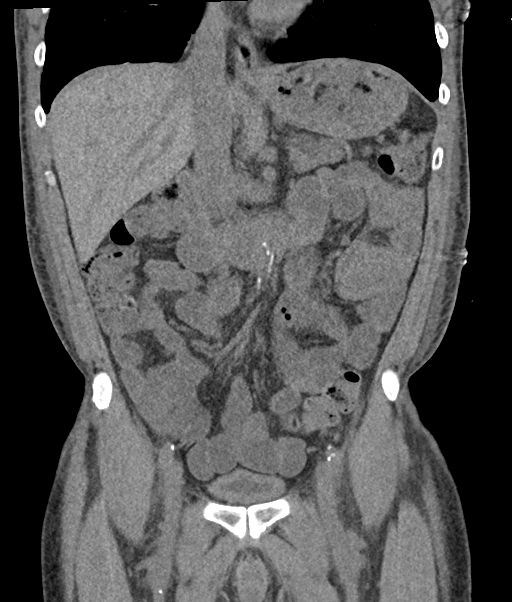
[im 53/96  soft-tissue]
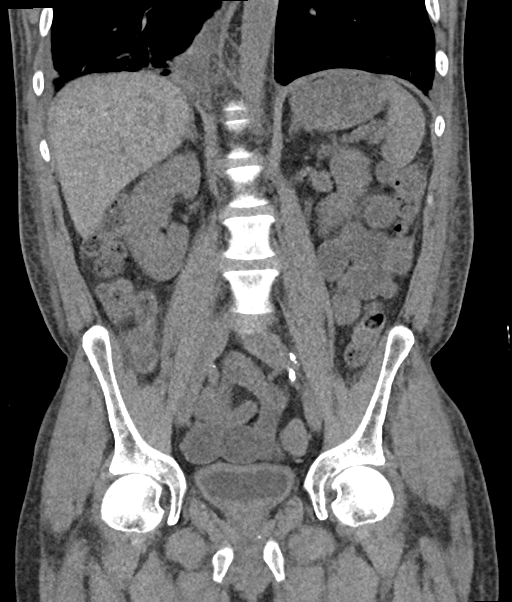

[13 of 46 positions shown; findings below may reference images not displayed]

FINDINGS: Lower chest: Interval development of moderate right and small left
pleural effusions. Basilar atelectasis noted in the dependent lower
lobes.

Hepatobiliary: No suspicious focal abnormality in the liver on this
study without intravenous contrast. Gallbladder is nondistended. No
intrahepatic or extrahepatic biliary dilation.

Pancreas: Dystrophic calcification through the pancreatic parenchyma
is compatible with chronic pancreatitis. 16 mm hypoattenuating
lesion in the pancreatic tail identified previously is stable. 12 mm
hypoattenuating lesion in the tip of the pancreatic tail is also
unchanged. No appreciable main duct dilatation. No substantial
periappendiceal edema or inflammation.

Spleen: No splenomegaly. No focal mass lesion.

Adrenals/Urinary Tract: No adrenal nodule or mass. Kidneys
unremarkable. No evidence for hydroureter. The urinary bladder
appears normal for the degree of distention.

Stomach/Bowel: Stomach is distended with food. Duodenum is normally
positioned as is the ligament of Treitz. No small bowel wall
thickening. No small bowel dilatation. The appendix is normal. No
gross colonic mass. No colonic wall thickening.

Vascular/Lymphatic: There is mild atherosclerotic calcification of
the abdominal aorta without aneurysm. There is no gastrohepatic or
hepatoduodenal ligament lymphadenopathy. No retroperitoneal or
mesenteric lymphadenopathy. No pelvic sidewall lymphadenopathy.

Reproductive: The prostate gland and seminal vesicles are
unremarkable.

Other: No intraperitoneal free fluid.

Musculoskeletal: No worrisome lytic or sclerotic osseous
abnormality.
IMPRESSION: 1. Interval development of moderate right and small left pleural
effusions with bibasilar atelectasis.
2. Sequelae of chronic pancreatitis.
3. Small lesions in the pancreas mm hypoattenuating are unchanged
and may reflect chronic pseudocysts.
4. Aortic Atherosclerosis ([id number].E).

## 2024-02-04 IMAGING — CR DG CHEST 2V
2 series · 2 of 2 positions shown · non-contrast
Comparison: 08/06/2021, CT 08/14/2021

CLINICAL DATA: Leg swelling

EXAM:
CHEST - 2 VIEW

[chest ap]
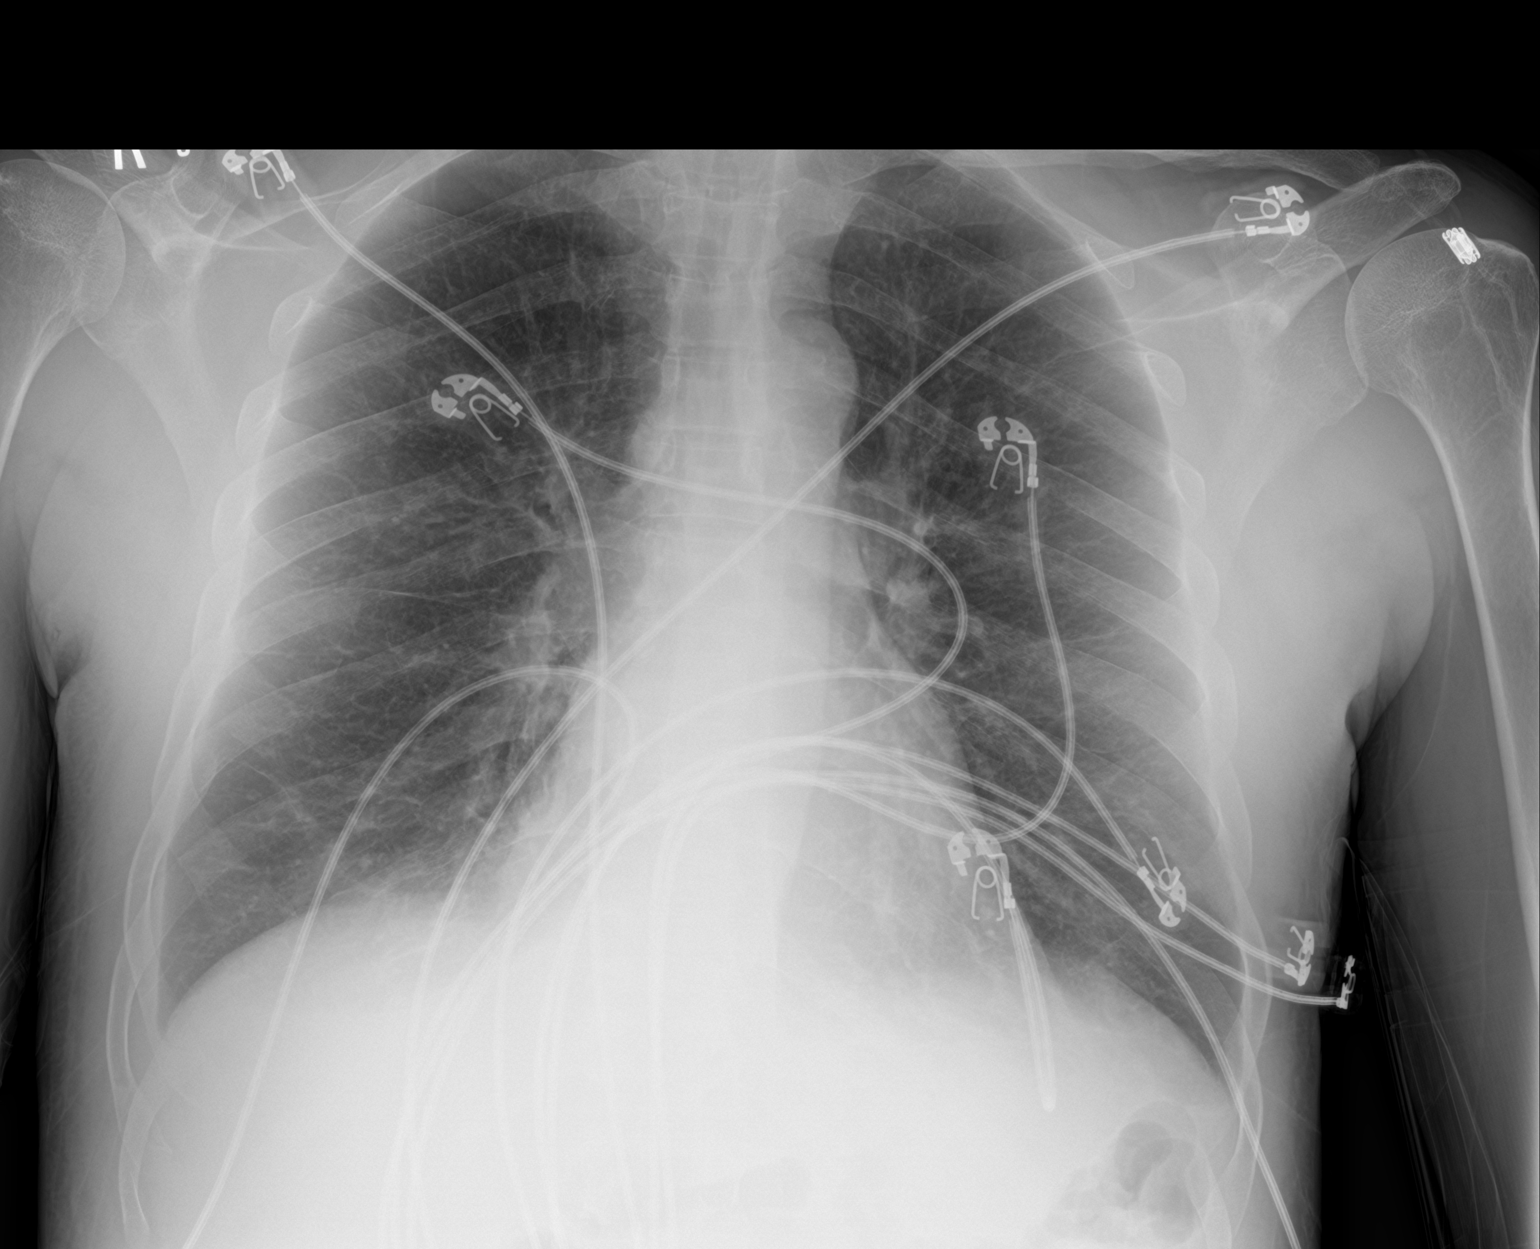

[chest lat]
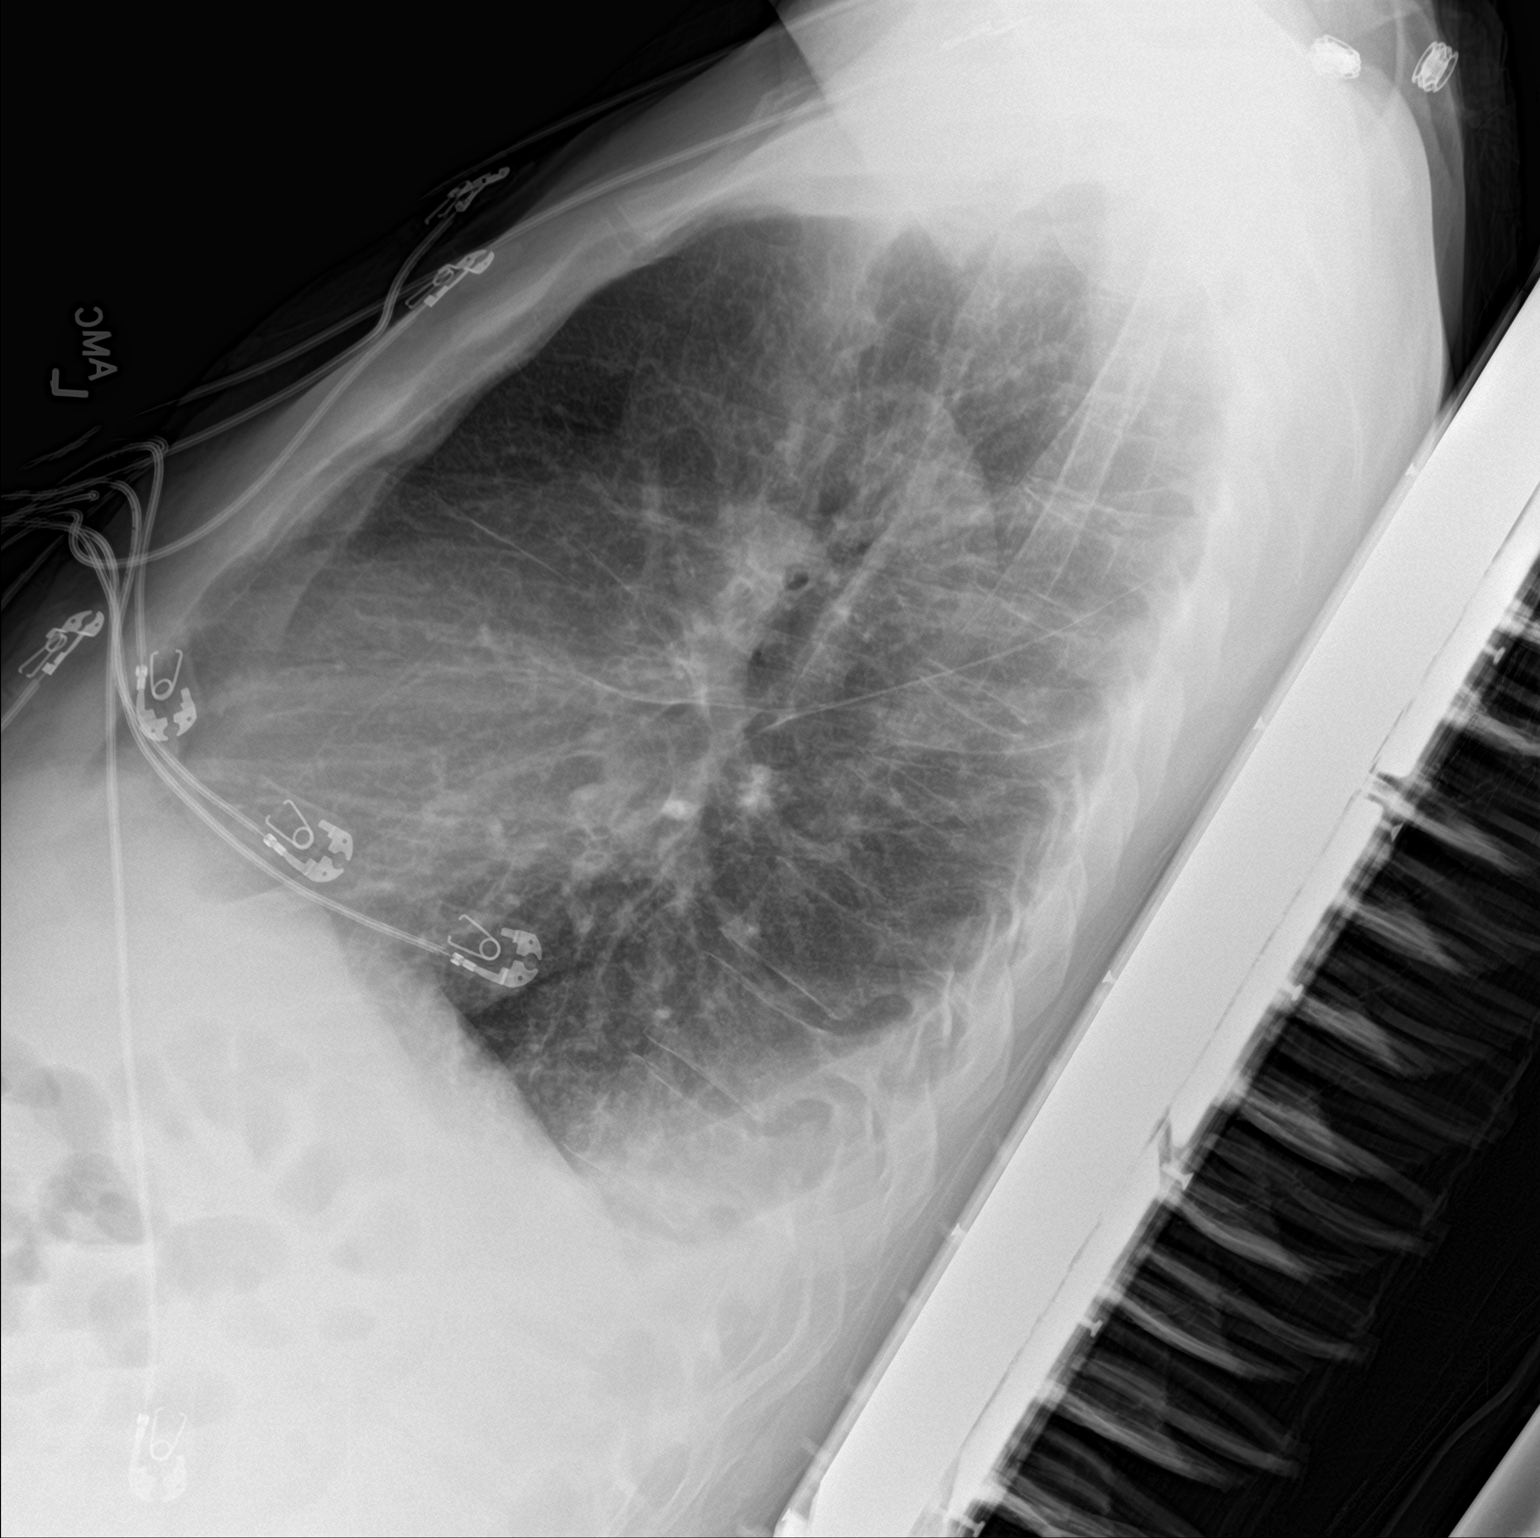

[2 of 2 positions shown; findings below may reference images not displayed]

FINDINGS: Small bilateral pleural effusions. No consolidation. Normal cardiac
size. No pneumothorax
IMPRESSION: Small bilateral pleural effusions

## 2024-02-04 MED ORDER — ONDANSETRON HCL 4 MG/2ML IJ SOLN
4.0000 mg | Freq: Once | INTRAMUSCULAR | Status: AC
  Administered 2024-02-04: 4 mg via INTRAVENOUS
  Filled 2024-02-04: qty 2

## 2024-02-04 MED ORDER — LIDOCAINE VISCOUS HCL 2 % MT SOLN: 15.0000 mL | Freq: Four times a day (QID) | OROMUCOSAL | 0 refills | Status: DC | PRN

## 2024-02-04 MED ORDER — OXYCODONE-ACETAMINOPHEN 5-325 MG PO TABS: 1.0000 | ORAL_TABLET | Freq: Four times a day (QID) | ORAL | 0 refills | Status: DC | PRN

## 2024-02-04 MED ORDER — ACETAMINOPHEN 500 MG PO TABS
1000.0000 mg | ORAL_TABLET | Freq: Once | ORAL | Status: AC
  Administered 2024-02-04: 1000 mg via ORAL
  Filled 2024-02-04: qty 2

## 2024-02-04 MED ORDER — PROCHLORPERAZINE MALEATE 5 MG PO TABS
10.0000 mg | ORAL_TABLET | Freq: Once | ORAL | Status: AC
  Administered 2024-02-04: 10 mg via ORAL
  Filled 2024-02-04: qty 2

## 2024-02-04 MED ORDER — MORPHINE SULFATE (PF) 4 MG/ML IV SOLN
4.0000 mg | Freq: Once | INTRAVENOUS | Status: AC
  Administered 2024-02-04: 4 mg via INTRAVENOUS
  Filled 2024-02-04: qty 1

## 2024-02-04 NOTE — ED Provider Notes (Signed)
 Signout received on this 53 year old male.  See previous note for full details.  Pending hepatic function panel at the end of shift change. Physical Exam  BP (!) 156/113   Pulse 80   Temp 98.4 F (36.9 C) (Oral)   Resp 16   Ht 5' 8 (1.727 m)   Wt 53.5 kg   SpO2 100%   BMI 17.94 kg/m     Procedures  Procedures  ED Course / MDM   Clinical Course as of 02/04/24 1611  Sun Feb 04, 2024  1335 Patient with history of chronic pancreatitis evaluated for complaints of epigastric and chest pain that started this morning associated with vomiting and diarrhea.  He is hypertensive otherwise hemodynamically stable.  On exam he does have tenderness to the epigastric region.  His abdomen is soft and nondistended. [JT]  1447 CBC(!) No leukocytosis, hemoglobin stable [JT]  1447 Basic metabolic panel(!) Bicarb of 19, anion gap within normal limits [JT]  1447 Lipase, blood Without elevation [JT]  1447 Troponin I (High Sensitivity) Without elevation [JT]  1447 Patient given a dose of morphine  and Zofran  initially upon presentation.  Upon reexamination patient is sitting comfortably with legs crossed reading the newspaper appears to be in no acute distress.  Reported to the nurse that he needed more pain medication.  When informed that he would not receive anymore patient reportedly started grimacing and writhing in pain. [JT]  1530 Disposition pending hepatic panel. Workup is otherwise overall reassuring.    [JT]    Clinical Course User Index [JT] Donnajean Lynwood DEL, PA-C   Medical Decision Making Amount and/or Complexity of Data Reviewed Labs: ordered. Decision-making details documented in ED Course. Radiology: ordered.  Risk OTC drugs. Prescription drug management.   Hepatic function panel with this without acute concern.  He is discharged.  Supportive care prescribed.  Patient voices understanding and is in agreement with plan. EKGs without acute ischemic change.  Did not crossover  into the system but was reviewed by attending.  Added under media tab.       Hildegard Loge, PA-C 02/04/24 1615    Ula Prentice SAUNDERS, MD 02/04/24 [phone]

## 2024-02-04 NOTE — ED Triage Notes (Signed)
 Pt c/o abdominal pain since last night.  NVD since this am and chest pain since 12 pm.  EKG NSR.

## 2024-02-04 NOTE — ED Provider Notes (Signed)
 [place] EMERGENCY DEPARTMENT AT [facility] Provider Note   CSN: [id number] Arrival date & time: 02/04/24  1227     Patient presents with: Abdominal Pain   [name] is a 53 y.o. male well known to this facility with history of chronic pancreatitis, WPW alcohol  and polysubstance abuse presents with complaints of epigastric abdominal pain that started this morning.  Is associated with vomiting and diarrhea.  Does report that his epigastric pain radiates to the left side of his chest.  Denies any shortness of breath.  States that he he does have some dizziness.  Denies any recent alcohol  or drug use.  States that he did have some fatty.  Last night.  He reports that he was recently evaluated in [facility] for similar complaints.  Had a CT study done which demonstrated diffuse gastric wall thickening.  He was recommended to follow-up with his PCP.    Abdominal Pain            Past Medical History:  Diagnosis Date   Alcoholism /alcohol  abuse    Anemia    Anxiety    Arthritis    knees; arms; elbows (03/26/2015)   Asthma    Bipolar disorder (HCC)    Chronic bronchitis (HCC)    Chronic lower back pain    Chronic pancreatitis (HCC)    Cocaine abuse (HCC)    Depression    Family history of adverse reaction to anesthesia    Femoral condyle fracture (HCC) 03/08/2014   left medial/notes 03/09/2014   GERD (gastroesophageal reflux disease)    H/O hiatal hernia    H/O suicide attempt 10/2012   High cholesterol    History of blood transfusion 10/2012   when I tried to commit suicide   History of stomach ulcers    Hypertension    Marijuana abuse, continuous    Migraine    a few times/year (03/26/2015)   Pancreatitis    Pneumonia 1990's X 3   PTSD (post-traumatic stress disorder)    Seizures (HCC)    Sickle cell trait    WPW (Wolff-Parkinson-White syndrome)    thelbert 03/06/2013                     Past Surgical History:  Procedure Laterality Date    BIOPSY  11/25/2017   Procedure: BIOPSY;  Surgeon: Burnette Fallow, MD;  Location: [facility] ENDOSCOPY;  Service: Endoscopy;;   BIOPSY  10/14/2018   Procedure: BIOPSY;  Surgeon: Burnette Fallow, MD;  Location: [facility] ENDOSCOPY;  Service: Endoscopy;;   BIOPSY  03/06/2023   Procedure: BIOPSY;  Surgeon: Wilhelmenia Aloha Raddle., MD;  Location: [facility];  Service: Gastroenterology;;   CARDIAC CATHETERIZATION     CYST ENTEROSTOMY  01/02/2020   Procedure: CYST ASPIRATION;  Surgeon: Teressa Toribio SQUIBB, MD;  Location: [facility] ENDOSCOPY;  Service: Endoscopy;;   ESOPHAGOGASTRODUODENOSCOPY N/A 03/06/2023   Procedure: ESOPHAGOGASTRODUODENOSCOPY (EGD);  Surgeon: Wilhelmenia Aloha Raddle., MD;  Location: [facility];  Service: Gastroenterology;  Laterality: N/A;   ESOPHAGOGASTRODUODENOSCOPY (EGD) WITH PROPOFOL  N/A 11/25/2017   Procedure: ESOPHAGOGASTRODUODENOSCOPY (EGD) WITH PROPOFOL ;  Surgeon: Burnette Fallow, MD;  Location: [facility] ENDOSCOPY;  Service: Endoscopy;  Laterality: N/A;   ESOPHAGOGASTRODUODENOSCOPY (EGD) WITH PROPOFOL  Left 10/14/2018   Procedure: ESOPHAGOGASTRODUODENOSCOPY (EGD) WITH PROPOFOL ;  Surgeon: Burnette Fallow, MD;  Location: [facility] ENDOSCOPY;  Service: Endoscopy;  Laterality: Left;   ESOPHAGOGASTRODUODENOSCOPY (EGD) WITH PROPOFOL  N/A 11/14/2018   Procedure: ESOPHAGOGASTRODUODENOSCOPY (EGD) WITH PROPOFOL ;  Surgeon: Celestia Agent, MD;  Location: [facility] ENDOSCOPY;  Service: Gastroenterology;  Laterality: N/A;   ESOPHAGOGASTRODUODENOSCOPY (EGD) WITH PROPOFOL  N/A 01/02/2020   Procedure: ESOPHAGOGASTRODUODENOSCOPY (EGD) WITH PROPOFOL ;  Surgeon: Teressa Toribio SQUIBB, MD;  Location: [facility] ENDOSCOPY;  Service: Endoscopy;  Laterality: N/A;   ESOPHAGOGASTRODUODENOSCOPY (EGD) WITH PROPOFOL  N/A 10/25/2020   Procedure: ESOPHAGOGASTRODUODENOSCOPY (EGD) WITH PROPOFOL ;  Surgeon: Wilhelmenia Aloha Raddle., MD;  Location: [facility] ENDOSCOPY;  Service: Gastroenterology;  Laterality: N/A;   EUS N/A 01/02/2020   Procedure: UPPER ENDOSCOPIC ULTRASOUND (EUS)  RADIAL;  Surgeon: Teressa Toribio SQUIBB, MD;  Location: [facility] ENDOSCOPY;  Service: Endoscopy;  Laterality: N/A;   EUS N/A 03/06/2023   Procedure: UPPER ENDOSCOPIC ULTRASOUND (EUS) RADIAL;  Surgeon: Wilhelmenia Aloha Raddle., MD;  Location: [facility] ENDOSCOPY;  Service: Gastroenterology;  Laterality: N/A;   EYE SURGERY Left 1990's   result of trauma    FACIAL FRACTURE SURGERY Left 1990's   result of trauma    FINE NEEDLE ASPIRATION N/A 03/06/2023   Procedure: FINE NEEDLE ASPIRATION (FNA) LINEAR;  Surgeon: Wilhelmenia Aloha Raddle., MD;  Location: [facility] ENDOSCOPY;  Service: Gastroenterology;  Laterality: N/A;   FLEXIBLE SIGMOIDOSCOPY N/A 10/25/2020   Procedure: FLEXIBLE SIGMOIDOSCOPY;  Surgeon: Wilhelmenia Aloha Raddle., MD;  Location: [facility] ENDOSCOPY;  Service: Gastroenterology;  Laterality: N/A;   FRACTURE SURGERY     HEMOSTASIS CLIP PLACEMENT  10/25/2020   Procedure: HEMOSTASIS CLIP PLACEMENT;  Surgeon: Wilhelmenia Aloha Raddle., MD;  Location: [facility] ENDOSCOPY;  Service: Gastroenterology;;   HERNIA REPAIR     HOT HEMOSTASIS N/A 03/06/2023   Procedure: HOT HEMOSTASIS (ARGON PLASMA COAGULATION/BICAP);  Surgeon: Wilhelmenia Aloha Raddle., MD;  Location: [facility];  Service: Gastroenterology;  Laterality: N/A;   LEFT HEART CATHETERIZATION WITH CORONARY ANGIOGRAM Right 03/07/2013   Procedure: LEFT HEART CATHETERIZATION WITH CORONARY ANGIOGRAM;  Surgeon: Salena GORMAN Negri, MD;  Location: [facility] CATH LAB;  Service: Cardiovascular;  Laterality: Right;   UMBILICAL HERNIA REPAIR     UPPER GASTROINTESTINAL ENDOSCOPY                  Prior to Admission medications   Medication Sig Start Date End Date Taking? Authorizing Provider  acetaminophen  (TYLENOL ) 500 MG tablet Take 1,000 mg by mouth daily as needed for mild pain (pain score 1-3) or moderate pain (pain score 4-6).    [provider]  carvedilol  (COREG ) 25 MG tablet Take 1 tablet (25 mg total) by mouth 2 (two) times daily with a meal. 12/08/23   West, Katlyn D, NP   famotidine  (PEPCID ) 40 MG tablet Take 1 tablet (40 mg total) by mouth daily. Patient taking differently: Take 40 mg by mouth 2 (two) times daily. 10/17/23   Henderly, Britni A, PA-C  lidocaine  (XYLOCAINE ) 2 % solution Use as directed 15 mLs in the mouth or throat every 6 (six) hours as needed for mouth pain. 02/04/24   Donnajean Lynwood DEL, PA-C  lipase/protease/amylase (CREON ) 36000 UNITS CPEP capsule Take 2 capsules (72,000 Units total) by mouth 3 (three) times daily with meals. May also take 1 capsule (36,000 Units total) as needed (with snacks). 08/21/23   Ghimire, Donalda HERO, MD  loperamide  (IMODIUM  A-D) 2 MG tablet Take 2 mg by mouth 4 (four) times daily as needed for diarrhea or loose stools.    [provider]  magnesium  oxide (MAG-OX) 400 (240 Mg) MG tablet Take 1 tablet (400 mg total) by mouth daily. 11/07/23   Hoy Nidia FALCON, PA-C  Multiple Vitamin (MULTIVITAMIN WITH MINERALS) TABS tablet Take 1 tablet by mouth daily. Patient taking differently: Take 2 tablets by mouth  daily. 08/21/23   Ghimire, Donalda HERO, MD  nicotine  (NICODERM CQ  - DOSED IN MG/24 HOURS) 21 mg/24hr patch Place 1 patch (21 mg total) onto the skin daily. 11/14/23   Amin, Ankit C, MD  ondansetron  (ZOFRAN -ODT) 4 MG disintegrating tablet Take 1 tablet (4 mg total) by mouth every 8 (eight) hours as needed. 11/23/23   Pahwani, Ravi, MD  oxyCODONE -acetaminophen  (PERCOCET) 5-325 MG tablet Take 1 tablet by mouth every 6 (six) hours as needed for severe pain (pain score 7-10). 02/04/24   Donnajean Lynwood DEL, PA-C  pantoprazole  (PROTONIX ) 40 MG tablet Take 1 tablet (40 mg total) by mouth daily. 01/18/24   Kehrli, Kelsey F, PA-C  polyethylene glycol powder (GLYCOLAX /MIRALAX ) 17 GM/SCOOP powder Take 17 g by mouth daily as needed for mild constipation. 11/13/23   Amin, Ankit C, MD  sucralfate  (CARAFATE ) 1 g tablet TAKE 1 TABLET BY MOUTH EVERY MORNING, AT NOON AND AT BEDTIME 02/02/24   Fleming, Zelda W, NP  tamsulosin  (FLOMAX ) 0.4 MG CAPS  capsule Take 1 capsule (0.4 mg total) by mouth daily. Patient not taking: Reported on 12/08/2023 08/21/23   Raenelle Donalda HERO, MD  amitriptyline  (ELAVIL ) 25 MG tablet Take 1 tablet (25 mg total) by mouth at bedtime. Patient not taking: Reported on 08/08/2019 10/15/18 08/08/19  Tobie Yetta HERO, MD    Allergies: Robaxin  [methocarbamol ], Bayer aspirin  [aspirin ], Trazodone  and nefazodone, Adhesive [tape], Latex, Toradol  [ketorolac  tromethamine ], Contrast media [iodinated contrast media], Fish allergy, Reglan  [metoclopramide ], Salmon [fish oil], and Shellfish allergy    Review of Systems  Gastrointestinal:  Positive for abdominal pain.    Updated Vital Signs BP (!) 156/113   Pulse 80   Temp 98.4 F (36.9 C) (Oral)   Resp 16   Ht 5' 8 (1.727 m)   Wt 53.5 kg   SpO2 100%   BMI 17.94 kg/m   Physical Exam Vitals and nursing note reviewed.  Constitutional:      General: He is not in acute distress.    Appearance: He is well-developed.  HENT:     Head: Normocephalic and atraumatic.  Eyes:     Conjunctiva/sclera: Conjunctivae normal.  Cardiovascular:     Rate and Rhythm: Normal rate and regular rhythm.     Heart sounds: No murmur heard. Pulmonary:     Effort: Pulmonary effort is normal. No respiratory distress.     Breath sounds: Normal breath sounds.  Abdominal:     Palpations: Abdomen is soft.     Tenderness: There is abdominal tenderness.     Comments: Epigastric tenderness, soft nondistended  Musculoskeletal:        General: No swelling.     Cervical back: Neck supple.  Skin:    General: Skin is warm and dry.     Capillary Refill: Capillary refill takes less than 2 seconds.  Neurological:     Mental Status: He is alert.  Psychiatric:        Mood and Affect: Mood normal.     (all labs ordered are listed, but only abnormal results are displayed) Labs Reviewed  BASIC METABOLIC PANEL WITH GFR - Abnormal; Notable for the following components:      Result Value   CO2 19 (*)     All other components within normal limits  CBC - Abnormal; Notable for the following components:   RBC 3.89 (*)    Hemoglobin 11.7 (*)    HCT 36.5 (*)    RDW 20.5 (*)    All other components within  normal limits  LIPASE, BLOOD  HEPATIC FUNCTION PANEL  TROPONIN I (HIGH SENSITIVITY)  TROPONIN I (HIGH SENSITIVITY)    EKG: None  Radiology: DG Chest 2 View Result Date: 02/04/2024 CLINICAL DATA:  chest pain abdominal pain EXAM: CHEST - 2 VIEW COMPARISON:  January 20, 2024 FINDINGS: The cardiomediastinal silhouette is normal in contour. Apical bullous emphysema. No pleural effusion. No pneumothorax. No acute pleuroparenchymal abnormality. Visualized abdomen is unremarkable. No acute osseous abnormality noted. IMPRESSION: No acute cardiopulmonary abnormality. Given underlying emphysema, recommend evaluation for candidacy for annual lung cancer screening CT. Electronically Signed   By: Corean Salter M.D.   On: 02/04/2024 14:00     Procedures   Medications Ordered in the ED  morphine  (PF) 4 MG/ML injection 4 mg (4 mg Intravenous Given 02/04/24 1344)  ondansetron  (ZOFRAN ) injection 4 mg (4 mg Intravenous Given 02/04/24 1343)  acetaminophen  (TYLENOL ) tablet 1,000 mg (1,000 mg Oral Given 02/04/24 1538)  prochlorperazine  (COMPAZINE ) tablet 10 mg (10 mg Oral Given 02/04/24 1538)    Clinical Course as of 02/04/24 1541  Sun Feb 04, 2024  1335 Patient with history of chronic pancreatitis evaluated for complaints of epigastric and chest pain that started this morning associated with vomiting and diarrhea.  He is hypertensive otherwise hemodynamically stable.  On exam he does have tenderness to the epigastric region.  His abdomen is soft and nondistended. [JT]  1447 CBC(!) No leukocytosis, hemoglobin stable [JT]  1447 Basic metabolic panel(!) Bicarb of 19, anion gap within normal limits [JT]  1447 Lipase, blood Without elevation [JT]  1447 Troponin I (High Sensitivity) Without elevation [JT]   1447 Patient given a dose of morphine  and Zofran  initially upon presentation.  Upon reexamination patient is sitting comfortably with legs crossed reading the newspaper appears to be in no acute distress.  Reported to the nurse that he needed more pain medication.  When informed that he would not receive anymore patient reportedly started grimacing and writhing in pain. [JT]  1530 Disposition pending hepatic panel. Workup is otherwise overall reassuring.    [JT]    Clinical Course User Index [JT] Donnajean Lynwood DEL, PA-C                                 Medical Decision Making Amount and/or Complexity of Data Reviewed Labs: ordered. Radiology: ordered.  Risk Prescription drug management.   This patient presents to the ED with chief complaint(s) of abdominal pain.  The complaint involves an extensive differential diagnosis and also carries with it a high risk of complications and morbidity.   Pertinent past medical history as listed in HPI  The differential diagnosis includes  ACS, pancreatitis,  Additional history obtained: Records reviewed Care Everywhere/External Records  Disposition:   Signout given to Amjad, PA-C. Please see his note for remainder of workup.  Disposition pending workup.   Social Determinants of Health:   none  This note was dictated with voice recognition software.  Despite best efforts at proofreading, errors may have occurred which can change the documentation meaning.       Final diagnoses:  Epigastric pain    ED Discharge Orders          Ordered    lidocaine  (XYLOCAINE ) 2 % solution  Every 6 hours PRN        02/04/24 1530    oxyCODONE -acetaminophen  (PERCOCET) 5-325 MG tablet  Every 6 hours PRN  02/04/24 1530               Donnajean Lynwood DEL, PA-C 02/04/24 1541    Randol Simmonds, MD 02/05/24 [phone]

## 2024-02-04 NOTE — Discharge Instructions (Addendum)
 You were evaluated in the emergency room for abdominal pain.  Your lab work did not show any significant abnormality.  I reviewed your CT imaging from [facility].  Please follow-up with your PCP as previously discussed.

## 2024-02-06 ENCOUNTER — Ambulatory Visit: Payer: MEDICAID | Attending: Cardiology | Admitting: Cardiology

## 2024-02-07 IMAGING — DX DG ABD PORTABLE 1V
1 series · 1 of 1 positions shown · non-contrast
Comparison: CT Abdomen and Pelvis 08/14/2021 and earlier.

CLINICAL DATA: 51-year-old male with nausea vomiting diarrhea.

EXAM:
PORTABLE ABDOMEN - 1 VIEW

[abdomen supine]
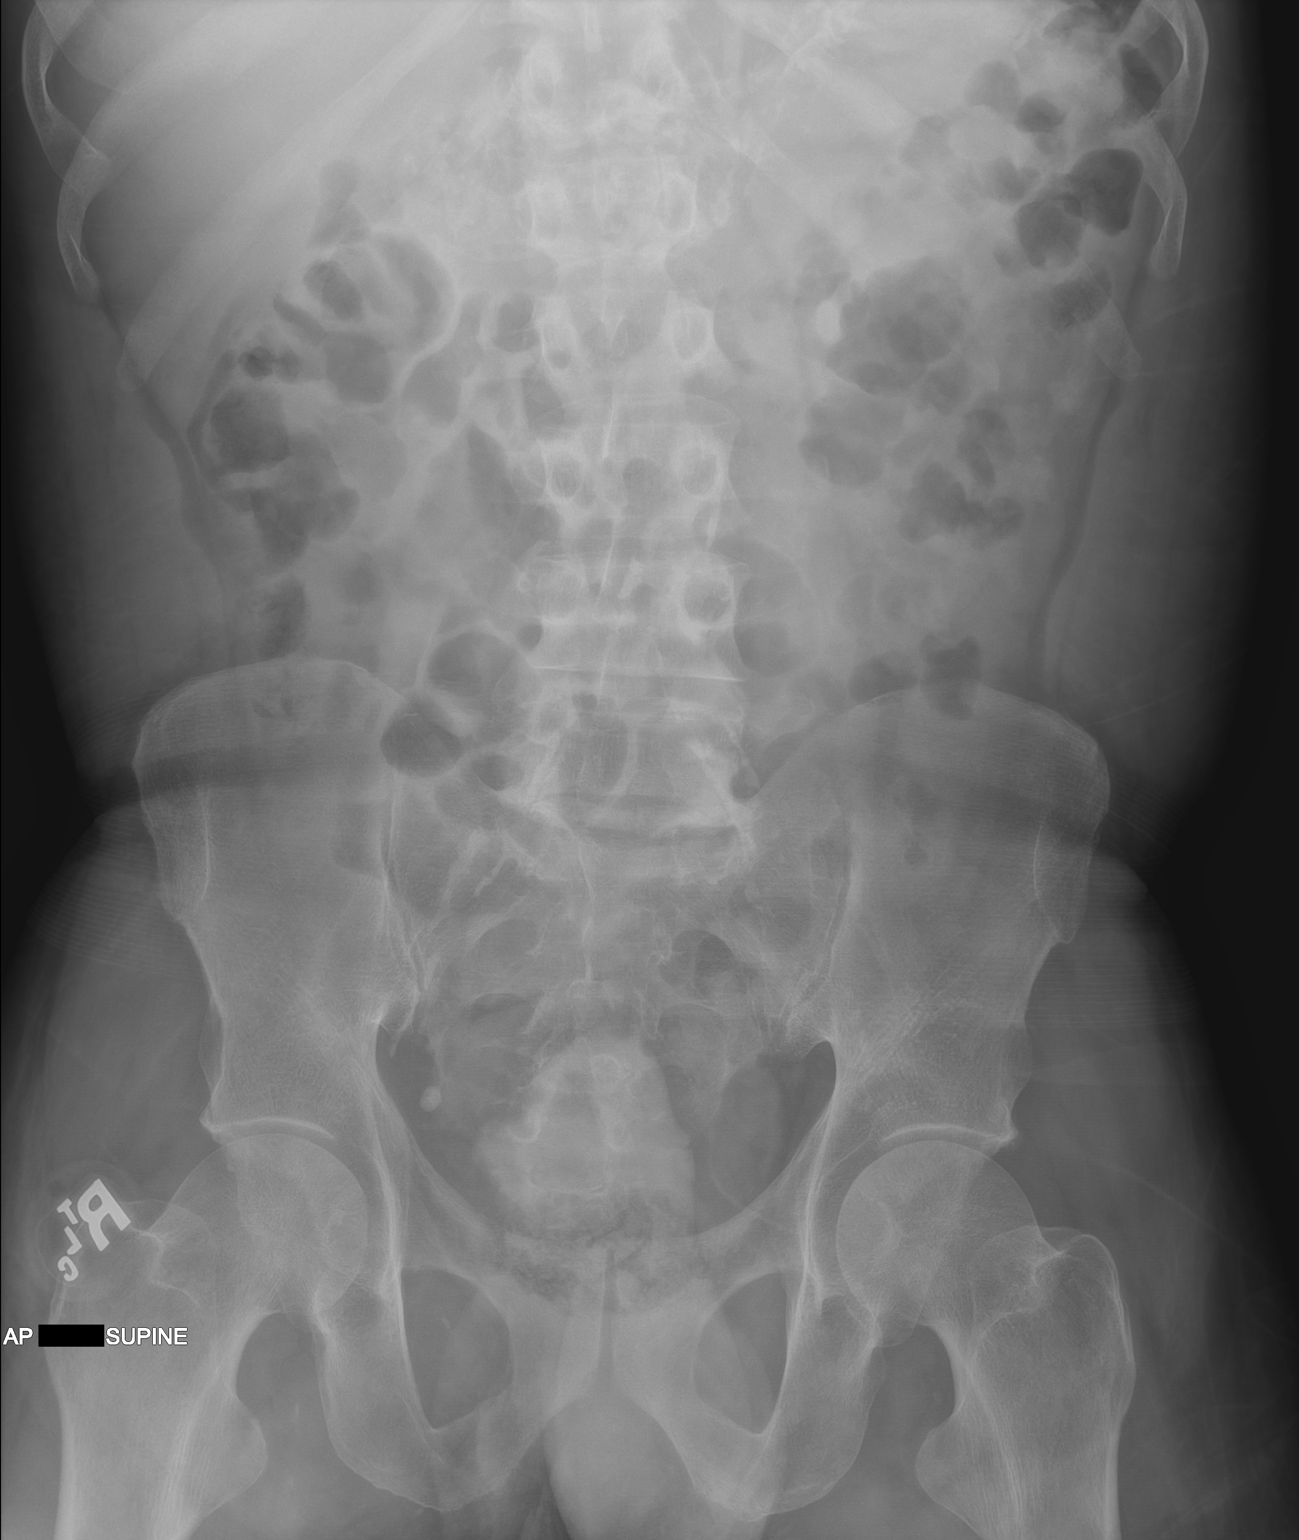

[1 of 1 positions shown; findings below may reference images not displayed]

FINDINGS: Portable AP supine view at 2045 hours. Non obstructed bowel gas
pattern. Dystrophic pancreatic calcifications and Aortoiliac
calcified atherosclerosis. Pelvic phleboliths. No pneumoperitoneum
is evident on this supine view. No acute osseous abnormality
identified.
IMPRESSION: 1. Nonobstructed bowel-gas pattern.
2. Dystrophic pancreatic calcification and calcified
atherosclerosis.

## 2024-02-08 ENCOUNTER — Encounter (HOSPITAL_COMMUNITY): Payer: Self-pay

## 2024-02-08 ENCOUNTER — Emergency Department (HOSPITAL_COMMUNITY)
Admission: EM | Admit: 2024-02-08 | Discharge: 2024-02-08 | Disposition: A | Discharge status: Home / Self Care | Payer: MEDICAID | Attending: Emergency Medicine | Admitting: Emergency Medicine

## 2024-02-08 ENCOUNTER — Other Ambulatory Visit: Payer: Self-pay

## 2024-02-08 ENCOUNTER — Emergency Department (HOSPITAL_COMMUNITY): Payer: MEDICAID

## 2024-02-08 DIAGNOSIS — Z91040 Latex allergy status: Secondary | ICD-10-CM | POA: Diagnosis not present

## 2024-02-08 DIAGNOSIS — R55 Syncope and collapse: Secondary | ICD-10-CM | POA: Insufficient documentation

## 2024-02-08 DIAGNOSIS — W19XXXA Unspecified fall, initial encounter: Secondary | ICD-10-CM | POA: Insufficient documentation

## 2024-02-08 DIAGNOSIS — D649 Anemia, unspecified: Secondary | ICD-10-CM | POA: Diagnosis not present

## 2024-02-08 DIAGNOSIS — M25511 Pain in right shoulder: Secondary | ICD-10-CM | POA: Insufficient documentation

## 2024-02-08 DIAGNOSIS — R000 Tachycardia, unspecified: Secondary | ICD-10-CM | POA: Diagnosis not present

## 2024-02-08 DIAGNOSIS — R4020 Unspecified coma: Secondary | ICD-10-CM

## 2024-02-08 LAB — CBG MONITORING, ED: Glucose-Capillary: 77 mg/dL (ref 70–99)

## 2024-02-08 LAB — COMPREHENSIVE METABOLIC PANEL WITH GFR
ALT: 28 U/L (ref 0–44)
AST: 46 U/L — ABNORMAL HIGH (ref 15–41)
Albumin: 4.5 g/dL (ref 3.5–5.0)
Alkaline Phosphatase: 82 U/L (ref 38–126)
Anion gap: 16 — ABNORMAL HIGH (ref 5–15)
BUN: 13 mg/dL (ref 6–20)
CO2: 15 mmol/L — ABNORMAL LOW (ref 22–32)
Calcium: 8.6 mg/dL — ABNORMAL LOW (ref 8.9–10.3)
Chloride: 105 mmol/L (ref 98–111)
Creatinine, Ser: 0.95 mg/dL (ref 0.61–1.24)
GFR, Estimated: >60 mL/min (ref >60)
Glucose, Bld: 80 mg/dL (ref 70–99)
Potassium: 3.8 mmol/L (ref 3.5–5.1)
Sodium: 136 mmol/L (ref 135–145)
Total Bilirubin: 1.6 mg/dL — ABNORMAL HIGH (ref 0.0–1.2)
Total Protein: 8.3 g/dL — ABNORMAL HIGH (ref 6.5–8.1)

## 2024-02-08 LAB — CBC
HCT: 36.9 % — ABNORMAL LOW (ref 39.0–52.0)
Hemoglobin: 12.3 g/dL — ABNORMAL LOW (ref 13.0–17.0)
MCH: 29.9 pg (ref 26.0–34.0)
MCHC: 33.3 g/dL (ref 30.0–36.0)
MCV: 89.6 fL (ref 80.0–100.0)
Platelets: 174 K/uL (ref 150–400)
RBC: 4.12 MIL/uL — ABNORMAL LOW (ref 4.22–5.81)
RDW: 21.2 % — ABNORMAL HIGH (ref 11.5–15.5)
WBC: 7.7 K/uL (ref 4.0–10.5)
nRBC: 0.3 % — ABNORMAL HIGH (ref 0.0–0.2)

## 2024-02-08 LAB — TROPONIN I (HIGH SENSITIVITY): Troponin I (High Sensitivity): 6 ng/L (ref <18)

## 2024-02-08 MED ORDER — SODIUM CHLORIDE 0.9 % IV BOLUS
1000.0000 mL | Freq: Once | INTRAVENOUS | Status: AC
  Administered 2024-02-08: 1000 mL via INTRAVENOUS

## 2024-02-08 MED ORDER — OXYCODONE HCL 5 MG PO TABS
5.0000 mg | ORAL_TABLET | Freq: Once | ORAL | Status: AC
  Administered 2024-02-08: 5 mg via ORAL
  Filled 2024-02-08: qty 1

## 2024-02-08 NOTE — ED Notes (Addendum)
 Pt pulled at IV tubing and broke tubing in half in an attempt to get out of bed. Pt pulled himself off of monitor and began walking down hallway. Pt redirected back to room and place back on monitor. RN notified about IV line. Pt urinal and call bell within reach, bed in lowest position with seizure pads placed on rails.

## 2024-02-08 NOTE — ED Notes (Signed)
 Pt to CT

## 2024-02-08 NOTE — ED Notes (Addendum)
 EMS, Risk manager and RN attempt multiple times to obtain IV access, unsuccessful. IV consult placed STAT. Seizure pads in place, pt connected to cardiac monitor, call bell in reach.   Pt reports he believes he had a seizure today, endorses seizure hx but states he does not take any medications. Last seizure was June of last year and reports he was admitted to the hospital. Pt reporting right shoulder pain and chronic abdominal pain from pancreatitis.

## 2024-02-08 NOTE — Discharge Instructions (Signed)
 Your lab work tonight was reassuring.  No signs of infection.  You had mild anemia, however this is not changed from prior blood test.  The cardiac enzyme for your heart was normal.  Your EKG did not show any signs of abnormal heartbeat or irritation of the heart.  You had a head CT which showed normal-appearing brain without any trauma or bleeding.  You had an x-ray of your chest and right shoulder which did not show any signs of broken bones or other problems.  Please continue your home medications.  Please follow-up with your doctor for recheck in the next 2 days.  Return to the emergency department if you develop any worsening persistent chest pain, shortness of breath, episodes of passing out, or recurrent seizures.

## 2024-02-08 NOTE — ED Triage Notes (Signed)
 Pt came in via POV d/t his mother concerned wen he was laying in the floor not sure if he had a seizure or why he was in the floor. His mother reports that he did fall, witnessed by her earlier while at the gas station & hit his jaw Rt shoulder on the ground, pt denies LOC when that happened. Pt has no memory of why he was in the floor, does have a Hx of seizures, EMS reports that he was acting like he was in a postictal period & he had some urinary incontinence. Is A/Ox4, upon arrival to ED. Once in triage he c/o CP & needs constant re-directing to get him to stay in his chair, d/t walking appears unstable.  132/72 102 bpm 100 ra 79 cbg No PIV

## 2024-02-08 NOTE — ED Provider Notes (Signed)
 [name] EMERGENCY DEPARTMENT AT [facility] Provider Note   CSN: [id number] Arrival date & time: 02/08/24  1610     Patient presents with: Fall and Seizures   [name] is a 53 y.o. male.   Patient with history of seizure disorder not currently on antiepileptic, history of WPW, chronic pancreatitis --presents to the emergency department today for evaluation of fall, questionable seizure.  Patient reports having some chest pains yesterday feeling like my WPW was acting up.  He also reports feeling like his heart was racing.  No syncope or lightheadedness at that time.  Today, he states he was going into a store and felt funny.  He states that he fell and landed on his face and his right shoulder causing pain.  He sustained some abrasions to his hands.  He reports upon returning home he had another episode where he lost consciousness and only remembers waking up with ambulance personnel around him.  He does have a headache.  No vomiting reported.  No current chest pain.  He states that it feels like he is dehydrated from his pancreatitis flaring.  Per triage note, patient was found on floor at home by family, unresponsive.  They corroborate prior fall without loss of consciousness.  There was a question of seizure/postictal state.        Prior to Admission medications   Medication Sig Start Date End Date Taking? Authorizing Provider  acetaminophen  (TYLENOL ) 500 MG tablet Take 1,000 mg by mouth daily as needed for mild pain (pain score 1-3) or moderate pain (pain score 4-6).    [provider]  carvedilol  (COREG ) 25 MG tablet Take 1 tablet (25 mg total) by mouth 2 (two) times daily with a meal. 12/08/23   West, Katlyn D, NP  famotidine  (PEPCID ) 40 MG tablet Take 1 tablet (40 mg total) by mouth daily. Patient taking differently: Take 40 mg by mouth 2 (two) times daily. 10/17/23   Henderly, Britni A, PA-C  lidocaine  (XYLOCAINE ) 2 % solution Use as directed 15  mLs in the mouth or throat every 6 (six) hours as needed for mouth pain. 02/04/24   Donnajean Lynwood DEL, PA-C  lipase/protease/amylase (CREON ) 36000 UNITS CPEP capsule Take 2 capsules (72,000 Units total) by mouth 3 (three) times daily with meals. May also take 1 capsule (36,000 Units total) as needed (with snacks). 08/21/23   Ghimire, Donalda HERO, MD  loperamide  (IMODIUM  A-D) 2 MG tablet Take 2 mg by mouth 4 (four) times daily as needed for diarrhea or loose stools.    [provider]  magnesium  oxide (MAG-OX) 400 (240 Mg) MG tablet Take 1 tablet (400 mg total) by mouth daily. 11/07/23   Hoy Nidia FALCON, PA-C  Multiple Vitamin (MULTIVITAMIN WITH MINERALS) TABS tablet Take 1 tablet by mouth daily. Patient taking differently: Take 2 tablets by mouth daily. 08/21/23   Ghimire, Donalda HERO, MD  nicotine  (NICODERM CQ  - DOSED IN MG/24 HOURS) 21 mg/24hr patch Place 1 patch (21 mg total) onto the skin daily. 11/14/23   Amin, Ankit C, MD  ondansetron  (ZOFRAN -ODT) 4 MG disintegrating tablet Take 1 tablet (4 mg total) by mouth every 8 (eight) hours as needed. 11/23/23   Vernon Ranks, MD  oxyCODONE -acetaminophen  (PERCOCET) 5-325 MG tablet Take 1 tablet by mouth every 6 (six) hours as needed for severe pain (pain score 7-10). 02/04/24   Donnajean Lynwood DEL, PA-C  pantoprazole  (PROTONIX ) 40 MG tablet Take 1 tablet (40 mg total) by mouth daily. 01/18/24  Kehrli, Kelsey F, PA-C  polyethylene glycol powder (GLYCOLAX /MIRALAX ) 17 GM/SCOOP powder Take 17 g by mouth daily as needed for mild constipation. 11/13/23   Amin, Ankit C, MD  sucralfate  (CARAFATE ) 1 g tablet TAKE 1 TABLET BY MOUTH EVERY MORNING, AT NOON AND AT BEDTIME 02/02/24   Fleming, Zelda W, NP  tamsulosin  (FLOMAX ) 0.4 MG CAPS capsule Take 1 capsule (0.4 mg total) by mouth daily. Patient not taking: Reported on 12/08/2023 08/21/23   Raenelle Donalda HERO, MD  amitriptyline  (ELAVIL ) 25 MG tablet Take 1 tablet (25 mg total) by mouth at bedtime. Patient not taking:  Reported on 08/08/2019 10/15/18 08/08/19  Tobie Yetta HERO, MD    Allergies: Robaxin  [methocarbamol ], Bayer aspirin  [aspirin ], Trazodone  and nefazodone, Adhesive [tape], Latex, Toradol  [ketorolac  tromethamine ], Contrast media [iodinated contrast media], Fish allergy, Reglan  [metoclopramide ], Salmon [fish oil], and Shellfish allergy    Review of Systems  Updated Vital Signs BP (!) 138/106 (BP Location: Left Arm)   Pulse (!) 109   Temp 98.2 F (36.8 C)   Resp 19   SpO2 99%   Physical Exam Vitals and nursing note reviewed.  Constitutional:      Appearance: He is well-developed.  HENT:     Head: Normocephalic and atraumatic. No raccoon eyes or Battle's sign.     Right Ear: Tympanic membrane, ear canal and external ear normal. No hemotympanum.     Left Ear: Tympanic membrane, ear canal and external ear normal. No hemotympanum.     Nose: Nose normal.     Mouth/Throat:     Mouth: Mucous membranes are moist.     Comments: Reports pain over the right mandibular jaw, but no dental injury, deformities or fractures noted.  Patient with full range of motion of the jaw.  No swelling. Eyes:     General: Lids are normal.     Conjunctiva/sclera: Conjunctivae normal.     Pupils: Pupils are equal, round, and reactive to light.     Comments: No visible hyphema  Cardiovascular:     Rate and Rhythm: Regular rhythm. Tachycardia present.     Comments: Mild tachycardia, around 100, no murmur Pulmonary:     Effort: Pulmonary effort is normal.     Breath sounds: Normal breath sounds.  Abdominal:     Palpations: Abdomen is soft.     Tenderness: There is no guarding or rebound.  Musculoskeletal:        General: Normal range of motion.     Right shoulder: Tenderness present. No bony tenderness. Normal range of motion.     Left shoulder: No tenderness or bony tenderness. Normal range of motion.     Right elbow: Normal range of motion.     Left elbow: Normal range of motion.     Right wrist: No tenderness.  Normal range of motion.     Left wrist: No tenderness. Normal range of motion.     Cervical back: Normal range of motion and neck supple. No tenderness or bony tenderness.     Thoracic back: No tenderness or bony tenderness.     Lumbar back: No tenderness or bony tenderness.     Comments: Bilateral hands, several superficial abrasions, no active bleeding.  Full range of motion of fingers and wrist.    Skin:    General: Skin is warm and dry.  Neurological:     Mental Status: He is alert and oriented to person, place, and time.     GCS: GCS eye subscore is 4. GCS  verbal subscore is 5. GCS motor subscore is 6.     Cranial Nerves: No cranial nerve deficit.     Sensory: No sensory deficit.     Coordination: Coordination normal.     (all labs ordered are listed, but only abnormal results are displayed) Labs Reviewed  COMPREHENSIVE METABOLIC PANEL WITH GFR - Abnormal; Notable for the following components:      Result Value   CO2 15 (*)    Calcium  8.6 (*)    Total Protein 8.3 (*)    AST 46 (*)    Total Bilirubin 1.6 (*)    Anion gap 16 (*)    All other components within normal limits  CBC - Abnormal; Notable for the following components:   RBC 4.12 (*)    Hemoglobin 12.3 (*)    HCT 36.9 (*)    RDW 21.2 (*)    nRBC 0.3 (*)    All other components within normal limits  CBG MONITORING, ED  TROPONIN I (HIGH SENSITIVITY)   ED ECG REPORT   Date: 02/08/2024  Rate: 109  Rhythm: sinus tachycardia  QRS Axis: normal  Intervals: normal  ST/T Wave abnormalities: normal  Conduction Disutrbances:none  Narrative Interpretation:   Old EKG Reviewed: changes noted, resolution of T wave abnormality, faster today than 02/04/24  I have personally reviewed the EKG tracing and agree with the computerized printout as noted.   Radiology: CT Head Wo Contrast Result Date: 02/08/2024 CLINICAL DATA:  Head trauma, moderate-severe Seizure disorder, no clinical change questionable seizure, fell hit  head EXAM: CT HEAD WITHOUT CONTRAST TECHNIQUE: Contiguous axial images were obtained from the base of the skull through the vertex without intravenous contrast. RADIATION DOSE REDUCTION: This exam was performed according to the departmental dose-optimization program which includes automated exposure control, adjustment of the mA and/or kV according to patient size and/or use of iterative reconstruction technique. COMPARISON:  CT head 11/10/2023 FINDINGS: Brain: No evidence of large-territorial acute infarction. No parenchymal hemorrhage. No mass lesion. No extra-axial collection. No mass effect or midline shift. No hydrocephalus. Basilar cisterns are patent. Vascular: No hyperdense vessel. Skull: No acute fracture or focal lesion. Old healed left lamina papyracea fracture. Surgical hardware along the left orbit partially visualized. Sinuses/Orbits: Paranasal sinuses and mastoid air cells are clear. The orbits are unremarkable. Other: None. IMPRESSION: No acute intracranial abnormality. Electronically Signed   By: Morgane  Naveau M.D.   On: 02/08/2024 17:40   DG Chest Portable 1 View Result Date: 02/08/2024 CLINICAL DATA:  Fall, seizures EXAM: PORTABLE CHEST 1 VIEW COMPARISON:  Chest x-ray 02/04/2024, CT chest 12/02/2023 FINDINGS: The heart and mediastinal contours are within normal limits. No focal consolidation. No pulmonary edema. No pleural effusion. No pneumothorax. No acute osseous abnormality. IMPRESSION: No active disease. Electronically Signed   By: Morgane  Naveau M.D.   On: 02/08/2024 17:37   DG Shoulder Right Result Date: 02/08/2024 CLINICAL DATA:  fall.  Seizures EXAM: RIGHT SHOULDER - 2+ VIEW COMPARISON:  None Available. FINDINGS: There is no evidence of fracture or dislocation. There is no evidence of arthropathy or other focal bone abnormality. Soft tissues are unremarkable. IMPRESSION: Negative. Electronically Signed   By: Morgane  Naveau M.D.   On: 02/08/2024 17:30     Procedures    Medications Ordered in the ED  sodium chloride  0.9 % bolus 1,000 mL (has no administration in time range)   ED Course  Patient seen and examined. History obtained directly from patient.  Reviewed recent ED notes from in  system and out of system.  I do not see any recent antiepileptics.  High ED utilization with at least 10 visits over the past month.  Labs/EKG: Ordered CBC, CMP, troponin, due to recently reported chest pains.  Imaging: Ordered x-ray of the shoulder, x-ray of the chest, CT head.  Medications/Fluids: Ordered: IV fluid bolus  Most recent vital signs reviewed and are as follows: BP (!) 138/106 (BP Location: Left Arm)   Pulse (!) 109   Temp 98.2 F (36.8 C)   Resp 19   SpO2 99%   Initial impression: Possible syncope/seizure, chronic pancreatitis, chest pain yesterday.  8:22 PM Reassessment performed. Patient appears stable.  Patient has returned to baseline.  He has not had any further seizure-like activity.  He has remained stable and looks well during ED stay.  On reexam, he complains of some right shoulder pain, requesting pain medication.  Offered sling, he declines.  He has full range of motion of his right shoulder.  Labs personally reviewed and interpreted including: CBC with normal white blood cell count, hemoglobin chronically mild anemia 12.3 today; CMP with CO2 15, normal blood sugar, possibly lactic acidosis from recent seizure, however patient's bicarb is typically low over the past several weeks during ED visits, treated with IV fluid bolus here; troponin normal at 6.  EKG personally reviewed, shows sinus tachycardia, no PR shortening, or signs of ischemia today.  Imaging personally visualized and interpreted including: CT head, agree negative.  X-ray of the chest and right shoulder agree negative.  Reviewed pertinent lab work and imaging with patient at bedside. Questions answered.   Most current vital signs reviewed and are as follows: BP (!) 137/109  (BP Location: Left Arm)   Pulse (!) 104   Temp 97.7 F (36.5 C) (Oral)   Resp 17   SpO2 100%   Plan: Discharge to home.   Prescriptions written for: None  Other home care instructions discussed: RICE protocol for right shoulder pain, advised no heavy lifting or pushing or pulling until pain improves.  ED return instructions discussed: Return with additional seizures, lightheadedness or syncope.  Return with persistent chest pain, shortness of breath.  Follow-up instructions discussed: Patient encouraged to follow-up with their PCP in 2 days.                                    Medical Decision Making Amount and/or Complexity of Data Reviewed Labs: ordered. Radiology: ordered.  Risk Prescription drug management.   Patient with fall/LOC/possible seizure.  Patient has returned to baseline upon ED arrival.  Normal and reassuring neuroexam.  No recurrent symptoms or decompensation during ED stay.  Head CT was negative.  In regards to fall from shoulder pain, x-ray was negative.  EKG with tachycardia, but no PR shortening or other arrhythmia.  Appears sinus.  Patient did report some chest pain although this was fairly nonspecific and atypical.  Patient more focused on chronic abdominal pain here.  Multiple ED visits for same.  History of chronic pancreatitis/chronic pain.  Patient was given 1 dose of oral pain medication prior to discharge.  Patient with frequent ED visits, everything appears stable tonight.  I do not see any mention of recent antiepileptic therapy and patient can visit this with his PCP.  The patient's vital signs, pertinent lab work and imaging were reviewed and interpreted as discussed in the ED course. Hospitalization was considered for further testing, treatments,  or serial exams/observation. However as patient is well-appearing, has a stable exam, and reassuring studies today, I do not feel that they warrant admission at this time. This plan was discussed with the  patient who verbalizes agreement and comfort with this plan and seems reliable and able to return to the Emergency Department with worsening or changing symptoms.       Final diagnoses:  Loss of consciousness (HCC)  Acute pain of right shoulder    ED Discharge Orders     None          Desiderio Chew, PA-C 02/08/24 2031    Pamella Ozell LABOR, DO 02/15/24 1729

## 2024-02-20 ENCOUNTER — Other Ambulatory Visit: Payer: Self-pay

## 2024-02-20 ENCOUNTER — Encounter (HOSPITAL_COMMUNITY): Payer: Self-pay

## 2024-02-20 ENCOUNTER — Emergency Department (HOSPITAL_COMMUNITY): Payer: MEDICAID

## 2024-02-20 ENCOUNTER — Inpatient Hospital Stay (HOSPITAL_COMMUNITY)
Admission: EM | Admit: 2024-02-20 | Discharge: 2024-03-01 | DRG: 308 | Disposition: A | Discharge status: Home / Self Care | Payer: MEDICAID

## 2024-02-20 DIAGNOSIS — Z885 Allergy status to narcotic agent status: Secondary | ICD-10-CM

## 2024-02-20 DIAGNOSIS — D509 Iron deficiency anemia, unspecified: Secondary | ICD-10-CM | POA: Diagnosis present

## 2024-02-20 DIAGNOSIS — I4891 Unspecified atrial fibrillation: Secondary | ICD-10-CM | POA: Diagnosis not present

## 2024-02-20 DIAGNOSIS — K766 Portal hypertension: Secondary | ICD-10-CM | POA: Diagnosis present

## 2024-02-20 DIAGNOSIS — F1990 Other psychoactive substance use, unspecified, uncomplicated: Secondary | ICD-10-CM | POA: Diagnosis present

## 2024-02-20 DIAGNOSIS — E7800 Pure hypercholesterolemia, unspecified: Secondary | ICD-10-CM | POA: Diagnosis present

## 2024-02-20 DIAGNOSIS — F1210 Cannabis abuse, uncomplicated: Secondary | ICD-10-CM | POA: Diagnosis present

## 2024-02-20 DIAGNOSIS — K861 Other chronic pancreatitis: Secondary | ICD-10-CM | POA: Diagnosis present

## 2024-02-20 DIAGNOSIS — R0789 Other chest pain: Secondary | ICD-10-CM | POA: Diagnosis not present

## 2024-02-20 DIAGNOSIS — E876 Hypokalemia: Secondary | ICD-10-CM | POA: Diagnosis present

## 2024-02-20 DIAGNOSIS — Z8249 Family history of ischemic heart disease and other diseases of the circulatory system: Secondary | ICD-10-CM

## 2024-02-20 DIAGNOSIS — Z808 Family history of malignant neoplasm of other organs or systems: Secondary | ICD-10-CM

## 2024-02-20 DIAGNOSIS — E8722 Chronic metabolic acidosis: Secondary | ICD-10-CM | POA: Diagnosis present

## 2024-02-20 DIAGNOSIS — Z91041 Radiographic dye allergy status: Secondary | ICD-10-CM

## 2024-02-20 DIAGNOSIS — N400 Enlarged prostate without lower urinary tract symptoms: Secondary | ICD-10-CM | POA: Diagnosis present

## 2024-02-20 DIAGNOSIS — I10 Essential (primary) hypertension: Secondary | ICD-10-CM | POA: Diagnosis present

## 2024-02-20 DIAGNOSIS — J4489 Other specified chronic obstructive pulmonary disease: Secondary | ICD-10-CM | POA: Diagnosis present

## 2024-02-20 DIAGNOSIS — E43 Unspecified severe protein-calorie malnutrition: Secondary | ICD-10-CM | POA: Diagnosis present

## 2024-02-20 DIAGNOSIS — G8929 Other chronic pain: Secondary | ICD-10-CM | POA: Diagnosis present

## 2024-02-20 DIAGNOSIS — R079 Chest pain, unspecified: Secondary | ICD-10-CM | POA: Diagnosis present

## 2024-02-20 DIAGNOSIS — F1010 Alcohol abuse, uncomplicated: Secondary | ICD-10-CM | POA: Diagnosis present

## 2024-02-20 DIAGNOSIS — D649 Anemia, unspecified: Secondary | ICD-10-CM | POA: Insufficient documentation

## 2024-02-20 DIAGNOSIS — Z91199 Patient's noncompliance with other medical treatment and regimen due to unspecified reason: Secondary | ICD-10-CM

## 2024-02-20 DIAGNOSIS — K859 Acute pancreatitis without necrosis or infection, unspecified: Secondary | ICD-10-CM | POA: Diagnosis present

## 2024-02-20 DIAGNOSIS — K860 Alcohol-induced chronic pancreatitis: Secondary | ICD-10-CM | POA: Diagnosis not present

## 2024-02-20 DIAGNOSIS — F1410 Cocaine abuse, uncomplicated: Secondary | ICD-10-CM | POA: Diagnosis present

## 2024-02-20 DIAGNOSIS — I480 Paroxysmal atrial fibrillation: Principal | ICD-10-CM | POA: Diagnosis present

## 2024-02-20 DIAGNOSIS — I493 Ventricular premature depolarization: Secondary | ICD-10-CM | POA: Diagnosis present

## 2024-02-20 DIAGNOSIS — E860 Dehydration: Secondary | ICD-10-CM | POA: Diagnosis present

## 2024-02-20 DIAGNOSIS — K219 Gastro-esophageal reflux disease without esophagitis: Secondary | ICD-10-CM | POA: Diagnosis present

## 2024-02-20 DIAGNOSIS — Z91040 Latex allergy status: Secondary | ICD-10-CM

## 2024-02-20 DIAGNOSIS — Z79899 Other long term (current) drug therapy: Secondary | ICD-10-CM

## 2024-02-20 DIAGNOSIS — F17210 Nicotine dependence, cigarettes, uncomplicated: Secondary | ICD-10-CM | POA: Diagnosis present

## 2024-02-20 DIAGNOSIS — Z789 Other specified health status: Secondary | ICD-10-CM

## 2024-02-20 DIAGNOSIS — I456 Pre-excitation syndrome: Secondary | ICD-10-CM | POA: Diagnosis present

## 2024-02-20 DIAGNOSIS — R109 Unspecified abdominal pain: Secondary | ICD-10-CM | POA: Diagnosis present

## 2024-02-20 DIAGNOSIS — D573 Sickle-cell trait: Secondary | ICD-10-CM | POA: Diagnosis present

## 2024-02-20 DIAGNOSIS — Z91018 Allergy to other foods: Secondary | ICD-10-CM

## 2024-02-20 DIAGNOSIS — E8809 Other disorders of plasma-protein metabolism, not elsewhere classified: Secondary | ICD-10-CM | POA: Diagnosis present

## 2024-02-20 DIAGNOSIS — Z91013 Allergy to seafood: Secondary | ICD-10-CM

## 2024-02-20 DIAGNOSIS — Z8379 Family history of other diseases of the digestive system: Secondary | ICD-10-CM

## 2024-02-20 DIAGNOSIS — Z888 Allergy status to other drugs, medicaments and biological substances status: Secondary | ICD-10-CM

## 2024-02-20 DIAGNOSIS — K3189 Other diseases of stomach and duodenum: Secondary | ICD-10-CM | POA: Diagnosis present

## 2024-02-20 DIAGNOSIS — Z681 Body mass index (BMI) 19 or less, adult: Secondary | ICD-10-CM

## 2024-02-20 DIAGNOSIS — Z91048 Other nonmedicinal substance allergy status: Secondary | ICD-10-CM

## 2024-02-20 DIAGNOSIS — Z811 Family history of alcohol abuse and dependence: Secondary | ICD-10-CM

## 2024-02-20 DIAGNOSIS — Z886 Allergy status to analgesic agent status: Secondary | ICD-10-CM

## 2024-02-20 DIAGNOSIS — K862 Cyst of pancreas: Secondary | ICD-10-CM | POA: Diagnosis not present

## 2024-02-20 LAB — LIPASE, BLOOD: Lipase: 24 U/L (ref 11–51)

## 2024-02-20 LAB — BASIC METABOLIC PANEL WITH GFR
Anion gap: 14 (ref 5–15)
BUN: 7 mg/dL (ref 6–20)
CO2: 11 mmol/L — ABNORMAL LOW (ref 22–32)
Calcium: 7.3 mg/dL — ABNORMAL LOW (ref 8.9–10.3)
Chloride: 109 mmol/L (ref 98–111)
Creatinine, Ser: 0.71 mg/dL (ref 0.61–1.24)
GFR, Estimated: >60 mL/min (ref >60)
Glucose, Bld: 104 mg/dL — ABNORMAL HIGH (ref 70–99)
Potassium: 3.4 mmol/L — ABNORMAL LOW (ref 3.5–5.1)
Sodium: 134 mmol/L — ABNORMAL LOW (ref 135–145)

## 2024-02-20 LAB — CBC WITH DIFFERENTIAL/PLATELET
Basophils Absolute: 0 K/uL (ref 0.0–0.1)
Basophils Relative: 0 %
Eosinophils Absolute: 0.1 K/uL (ref 0.0–0.5)
Eosinophils Relative: 1 %
HCT: 35.5 % — ABNORMAL LOW (ref 39.0–52.0)
Hemoglobin: 11.5 g/dL — ABNORMAL LOW (ref 13.0–17.0)
Lymphocytes Relative: 43 %
Lymphs Abs: 2.2 K/uL (ref 0.7–4.0)
MCH: 30.9 pg (ref 26.0–34.0)
MCHC: 32.4 g/dL (ref 30.0–36.0)
MCV: 95.4 fL (ref 80.0–100.0)
Monocytes Absolute: 0.2 K/uL (ref 0.1–1.0)
Monocytes Relative: 4 %
Neutro Abs: 2.7 K/uL (ref 1.7–7.7)
Neutrophils Relative %: 52 %
Platelets: 149 K/uL — ABNORMAL LOW (ref 150–400)
RBC: 3.72 MIL/uL — ABNORMAL LOW (ref 4.22–5.81)
RDW: 22.1 % — ABNORMAL HIGH (ref 11.5–15.5)
WBC: 5.1 K/uL (ref 4.0–10.5)
nRBC: 1.2 % — ABNORMAL HIGH (ref 0.0–0.2)

## 2024-02-20 LAB — RAPID URINE DRUG SCREEN, HOSP PERFORMED
Amphetamines: NOT DETECTED
Barbiturates: NOT DETECTED
Benzodiazepines: NOT DETECTED
Cocaine: NOT DETECTED
Opiates: POSITIVE — AB
Tetrahydrocannabinol: NOT DETECTED

## 2024-02-20 LAB — I-STAT CHEM 8, ED
BUN: 5 mg/dL — ABNORMAL LOW (ref 6–20)
Calcium, Ion: 0.99 mmol/L — ABNORMAL LOW (ref 1.15–1.40)
Chloride: 111 mmol/L (ref 98–111)
Creatinine, Ser: 0.8 mg/dL (ref 0.61–1.24)
Glucose, Bld: 105 mg/dL — ABNORMAL HIGH (ref 70–99)
HCT: 42 % (ref 39.0–52.0)
Hemoglobin: 14.3 g/dL (ref 13.0–17.0)
Potassium: 3.4 mmol/L — ABNORMAL LOW (ref 3.5–5.1)
Sodium: 140 mmol/L (ref 135–145)
TCO2: 13 mmol/L — ABNORMAL LOW (ref 22–32)

## 2024-02-20 LAB — HIV ANTIBODY (ROUTINE TESTING W REFLEX): HIV Screen 4th Generation wRfx: NONREACTIVE

## 2024-02-20 LAB — HEPATIC FUNCTION PANEL
ALT: 36 U/L (ref 0–44)
AST: 80 U/L — ABNORMAL HIGH (ref 15–41)
Albumin: 3.6 g/dL (ref 3.5–5.0)
Alkaline Phosphatase: 86 U/L (ref 38–126)
Bilirubin, Direct: 0.2 mg/dL (ref 0.0–0.2)
Indirect Bilirubin: 0.4 mg/dL (ref 0.3–0.9)
Total Bilirubin: 0.6 mg/dL (ref 0.0–1.2)
Total Protein: 7 g/dL (ref 6.5–8.1)

## 2024-02-20 LAB — T4, FREE: Free T4: 0.74 ng/dL (ref 0.61–1.12)

## 2024-02-20 LAB — MAGNESIUM: Magnesium: 0.8 mg/dL — CL (ref 1.7–2.4)

## 2024-02-20 LAB — BRAIN NATRIURETIC PEPTIDE: B Natriuretic Peptide: 100.3 pg/mL — ABNORMAL HIGH (ref 0.0–100.0)

## 2024-02-20 LAB — TROPONIN I (HIGH SENSITIVITY)
Troponin I (High Sensitivity): 5 ng/L (ref <18)
Troponin I (High Sensitivity): 9 ng/L (ref <18)

## 2024-02-20 LAB — TSH: TSH: 0.913 u[IU]/mL (ref 0.350–4.500)

## 2024-02-20 MED ORDER — FAMOTIDINE 20 MG PO TABS
40.0000 mg | ORAL_TABLET | Freq: Every day | ORAL | Status: DC
  Administered 2024-02-21 – 2024-03-01 (×10): 40 mg via ORAL
  Filled 2024-02-20 (×10): qty 2

## 2024-02-20 MED ORDER — ACETAMINOPHEN 500 MG PO TABS
1000.0000 mg | ORAL_TABLET | Freq: Four times a day (QID) | ORAL | Status: DC
  Administered 2024-02-20 – 2024-02-21 (×4): 1000 mg via ORAL
  Filled 2024-02-20 (×5): qty 2

## 2024-02-20 MED ORDER — PROCAINAMIDE HCL 100 MG/ML IJ SOLN
900.0000 mg | Freq: Once | INTRAVENOUS | Status: DC
Start: 2024-02-20 — End: 2024-02-20
  Administered 2024-02-20: 900 mg via INTRAVENOUS
  Filled 2024-02-20: qty 9

## 2024-02-20 MED ORDER — DILTIAZEM HCL-DEXTROSE 125-5 MG/125ML-% IV SOLN (PREMIX)
5.0000 mg/h | INTRAVENOUS | Status: DC
  Administered 2024-02-20: 5 mg/h via INTRAVENOUS
  Administered 2024-02-21: 15 mg/h via INTRAVENOUS
  Administered 2024-02-21: 5 mg/h via INTRAVENOUS
  Filled 2024-02-20 (×2): qty 125

## 2024-02-20 MED ORDER — TAMSULOSIN HCL 0.4 MG PO CAPS
0.4000 mg | ORAL_CAPSULE | Freq: Every day | ORAL | Status: DC
  Administered 2024-02-20 – 2024-03-01 (×11): 0.4 mg via ORAL
  Filled 2024-02-20 (×10): qty 1

## 2024-02-20 MED ORDER — THIAMINE HCL 100 MG/ML IJ SOLN
100.0000 mg | Freq: Every day | INTRAMUSCULAR | Status: DC
  Filled 2024-02-20 (×4): qty 2

## 2024-02-20 MED ORDER — PHENOBARBITAL 32.4 MG PO TABS
97.2000 mg | ORAL_TABLET | Freq: Three times a day (TID) | ORAL | Status: AC
  Administered 2024-02-20 – 2024-02-22 (×6): 97.2 mg via ORAL
  Filled 2024-02-20 (×6): qty 3

## 2024-02-20 MED ORDER — THIAMINE MONONITRATE 100 MG PO TABS
100.0000 mg | ORAL_TABLET | Freq: Every day | ORAL | Status: DC
  Administered 2024-02-20 – 2024-03-01 (×11): 100 mg via ORAL
  Filled 2024-02-20 (×11): qty 1

## 2024-02-20 MED ORDER — LORAZEPAM 2 MG/ML IJ SOLN: 1.0000 mg | INTRAMUSCULAR | Status: DC | PRN

## 2024-02-20 MED ORDER — MAGNESIUM SULFATE 2 GM/50ML IV SOLN
2.0000 g | Freq: Once | INTRAVENOUS | Status: AC
  Administered 2024-02-21: 2 g via INTRAVENOUS
  Filled 2024-02-20: qty 50

## 2024-02-20 MED ORDER — PANCRELIPASE (LIP-PROT-AMYL) 36000-114000 UNITS PO CPEP
72000.0000 [IU] | ORAL_CAPSULE | Freq: Three times a day (TID) | ORAL | Status: DC
  Administered 2024-02-21 – 2024-02-23 (×5): 72000 [IU] via ORAL
  Filled 2024-02-20 (×9): qty 2

## 2024-02-20 MED ORDER — HYDROMORPHONE HCL 1 MG/ML IJ SOLN
1.0000 mg | Freq: Once | INTRAMUSCULAR | Status: AC
  Administered 2024-02-20: 1 mg via INTRAVENOUS
  Filled 2024-02-20: qty 1

## 2024-02-20 MED ORDER — DILTIAZEM LOAD VIA INFUSION
20.0000 mg | Freq: Once | INTRAVENOUS | Status: AC
  Administered 2024-02-20: 20 mg via INTRAVENOUS
  Filled 2024-02-20: qty 20

## 2024-02-20 MED ORDER — LACTATED RINGERS IV SOLN: INTRAVENOUS | Status: AC

## 2024-02-20 MED ORDER — ADULT MULTIVITAMIN W/MINERALS CH
1.0000 | ORAL_TABLET | Freq: Every day | ORAL | Status: DC
  Administered 2024-02-20 – 2024-03-01 (×11): 1 via ORAL
  Filled 2024-02-20 (×10): qty 1

## 2024-02-20 MED ORDER — OXYCODONE HCL 5 MG PO TABS
5.0000 mg | ORAL_TABLET | ORAL | Status: DC | PRN
Start: 2024-02-20 — End: 2024-02-21
  Administered 2024-02-20 – 2024-02-21 (×6): 5 mg via ORAL
  Filled 2024-02-20 (×7): qty 1

## 2024-02-20 MED ORDER — ENOXAPARIN SODIUM 40 MG/0.4ML IJ SOSY
40.0000 mg | PREFILLED_SYRINGE | INTRAMUSCULAR | Status: DC
  Administered 2024-02-20 – 2024-02-29 (×10): 40 mg via SUBCUTANEOUS
  Filled 2024-02-20 (×10): qty 0.4

## 2024-02-20 MED ORDER — NICOTINE 14 MG/24HR TD PT24
14.0000 mg | MEDICATED_PATCH | Freq: Every day | TRANSDERMAL | Status: DC
  Administered 2024-02-20: 14 mg via TRANSDERMAL
  Filled 2024-02-20: qty 1

## 2024-02-20 MED ORDER — LACTATED RINGERS IV BOLUS
500.0000 mL | Freq: Once | INTRAVENOUS | Status: AC
  Administered 2024-02-20: 500 mL via INTRAVENOUS

## 2024-02-20 MED ORDER — FOLIC ACID 1 MG PO TABS
1.0000 mg | ORAL_TABLET | Freq: Every day | ORAL | Status: DC
  Administered 2024-02-20 – 2024-03-01 (×11): 1 mg via ORAL
  Filled 2024-02-20 (×10): qty 1

## 2024-02-20 MED ORDER — PANCRELIPASE (LIP-PROT-AMYL) 36000-114000 UNITS PO CPEP
36000.0000 [IU] | ORAL_CAPSULE | ORAL | Status: DC | PRN
  Administered 2024-02-21: 36000 [IU] via ORAL
  Filled 2024-02-20: qty 1

## 2024-02-20 MED ORDER — CARVEDILOL 25 MG PO TABS
25.0000 mg | ORAL_TABLET | Freq: Two times a day (BID) | ORAL | Status: DC
  Administered 2024-02-20 – 2024-03-01 (×20): 25 mg via ORAL
  Filled 2024-02-20 (×3): qty 1
  Filled 2024-02-20: qty 2
  Filled 2024-02-20 (×12): qty 1
  Filled 2024-02-20: qty 2
  Filled 2024-02-20 (×3): qty 1

## 2024-02-20 MED ORDER — PANTOPRAZOLE SODIUM 40 MG PO TBEC
40.0000 mg | DELAYED_RELEASE_TABLET | Freq: Every day | ORAL | Status: DC
  Administered 2024-02-21 – 2024-03-01 (×10): 40 mg via ORAL
  Filled 2024-02-20 (×9): qty 1

## 2024-02-20 MED ORDER — PHENOBARBITAL 32.4 MG PO TABS
32.4000 mg | ORAL_TABLET | Freq: Three times a day (TID) | ORAL | Status: AC
  Administered 2024-02-24 – 2024-02-26 (×6): 32.4 mg via ORAL
  Filled 2024-02-20 (×6): qty 1

## 2024-02-20 MED ORDER — SUCRALFATE 1 G PO TABS
1.0000 g | ORAL_TABLET | Freq: Three times a day (TID) | ORAL | Status: DC
  Administered 2024-02-20 – 2024-03-01 (×37): 1 g via ORAL
  Filled 2024-02-20 (×43): qty 1

## 2024-02-20 MED ORDER — PHENOBARBITAL 32.4 MG PO TABS
64.8000 mg | ORAL_TABLET | Freq: Three times a day (TID) | ORAL | Status: AC
  Administered 2024-02-22 – 2024-02-24 (×6): 64.8 mg via ORAL
  Filled 2024-02-20 (×6): qty 2

## 2024-02-20 NOTE — ED Notes (Signed)
 Per pharmacy, wait to give procainamide  until pharmacy can be in the room to monitor.

## 2024-02-20 NOTE — Assessment & Plan Note (Addendum)
 History of paroxysmal A-fib and Wolff-Parkinson-White syndrome but appears to be more in A-fib with RVR per cardiology.  Takes carvedilol  25 mg twice daily outpatient, okay to continue per cards.  Currently not rate controlled, will monitor on the drip. - Admit to [facility]MT[facility], attending Dr. Delores -Vital signs per floor - Cardiology consulted, appreciate recommendations  - Diltiazem drip  - Recheck TSH with free T4

## 2024-02-20 NOTE — Assessment & Plan Note (Addendum)
 Acute on chronic, likely chronic pancreatitis flare in the setting of eating fatty foods.  Begins in the epigastrium and radiates laterally, usual pain pattern but consider additional imaging if not improving. - Clear liquid diet, advance as tolerated - LR at 75 mL/hr x24 hours - Creon  3 times daily with meals - Multivitamin with minerals daily - Pepcid  40 mg daily - Protonix  40 mg daily - Sucralfate  1 g 3 times daily Pain regimen  - Tylenol  1000 mg Q6h scheduled - Oxycodone  5 mg every 4 hours as needed - Can consider CTAP if pain acutely worsens overnight

## 2024-02-20 NOTE — Assessment & Plan Note (Addendum)
 GERD-continue home Protonix  40, sucralfate  1 g TID, and Pepcid  40 daily BPH-continue home tamsulosin  0.5 mg daily

## 2024-02-20 NOTE — ED Provider Notes (Addendum)
 [facility] AT [facility] Provider Note   CSN: [id number] Arrival date & time: 02/20/24  1345     Patient presents with: Tachycardia   [name] is a 53 y.o. male.   53 year old male presents for evaluation of chest pain and multiple complaints.  He has a history of A-fib and WPW.  Heart rate was 140s to 180s for EMS and patient states up to 210 at home.  He has clear chest pain shortness of breath as well as some abdominal pain that he states he thinks is from his pancreatitis.  He was given morphine  Zofran  card wall and route to the ER in the ambulance.  Heart rate is 120s to 140s at this time.  Denies any other symptoms or concerns.        Prior to Admission medications   Medication Sig Start Date End Date Taking? Authorizing Provider  acetaminophen  (TYLENOL ) 500 MG tablet Take 1,000 mg by mouth daily as needed for mild pain (pain score 1-3) or moderate pain (pain score 4-6).    [provider]  carvedilol  (COREG ) 25 MG tablet Take 1 tablet (25 mg total) by mouth 2 (two) times daily with a meal. 12/08/23   West, Katlyn D, NP  famotidine  (PEPCID ) 40 MG tablet Take 1 tablet (40 mg total) by mouth daily. Patient taking differently: Take 40 mg by mouth 2 (two) times daily. 10/17/23   Henderly, Britni A, PA-C  lidocaine  (XYLOCAINE ) 2 % solution Use as directed 15 mLs in the mouth or throat every 6 (six) hours as needed for mouth pain. 02/04/24   Donnajean Lynwood DEL, PA-C  lipase/protease/amylase (CREON ) 36000 UNITS CPEP capsule Take 2 capsules (72,000 Units total) by mouth 3 (three) times daily with meals. May also take 1 capsule (36,000 Units total) as needed (with snacks). 08/21/23   Ghimire, Donalda HERO, MD  loperamide  (IMODIUM  A-D) 2 MG tablet Take 2 mg by mouth 4 (four) times daily as needed for diarrhea or loose stools.    [provider]  magnesium  oxide (MAG-OX) 400 (240 Mg) MG tablet Take 1 tablet (400 mg total) by mouth  daily. 11/07/23   Hoy Nidia FALCON, PA-C  Multiple Vitamin (MULTIVITAMIN WITH MINERALS) TABS tablet Take 1 tablet by mouth daily. Patient taking differently: Take 2 tablets by mouth daily. 08/21/23   Ghimire, Donalda HERO, MD  nicotine  (NICODERM CQ  - DOSED IN MG/24 HOURS) 21 mg/24hr patch Place 1 patch (21 mg total) onto the skin daily. 11/14/23   Amin, Ankit C, MD  ondansetron  (ZOFRAN -ODT) 4 MG disintegrating tablet Take 1 tablet (4 mg total) by mouth every 8 (eight) hours as needed. 11/23/23   Vernon Ranks, MD  oxyCODONE -acetaminophen  (PERCOCET) 5-325 MG tablet Take 1 tablet by mouth every 6 (six) hours as needed for severe pain (pain score 7-10). 02/04/24   Donnajean Lynwood DEL, PA-C  pantoprazole  (PROTONIX ) 40 MG tablet Take 1 tablet (40 mg total) by mouth daily. 01/18/24   Kehrli, Kelsey F, PA-C  polyethylene glycol powder (GLYCOLAX /MIRALAX ) 17 GM/SCOOP powder Take 17 g by mouth daily as needed for mild constipation. 11/13/23   Amin, Ankit C, MD  sucralfate  (CARAFATE ) 1 g tablet TAKE 1 TABLET BY MOUTH EVERY MORNING, AT NOON AND AT BEDTIME 02/02/24   Fleming, Zelda W, NP  tamsulosin  (FLOMAX ) 0.4 MG CAPS capsule Take 1 capsule (0.4 mg total) by mouth daily. Patient not taking: Reported on 12/08/2023 08/21/23   Raenelle Donalda HERO, MD  amitriptyline  (ELAVIL ) 25  MG tablet Take 1 tablet (25 mg total) by mouth at bedtime. Patient not taking: Reported on 08/08/2019 10/15/18 08/08/19  Tobie Yetta HERO, MD    Allergies: Robaxin  [methocarbamol ], Bayer aspirin  [aspirin ], Trazodone  and nefazodone, Adhesive [tape], Latex, Toradol  [ketorolac  tromethamine ], Contrast media [iodinated contrast media], Fish allergy, Reglan  [metoclopramide ], Garnell gums oil], and Shellfish allergy    Review of Systems  Constitutional:  Negative for chills and fever.  HENT:  Negative for ear pain and sore throat.   Eyes:  Negative for pain and visual disturbance.  Respiratory:  Positive for shortness of breath. Negative for cough.    Cardiovascular:  Positive for chest pain. Negative for palpitations.  Gastrointestinal:  Positive for abdominal pain. Negative for vomiting.  Genitourinary:  Negative for dysuria and hematuria.  Musculoskeletal:  Negative for arthralgias and back pain.  Skin:  Negative for color change and rash.  Neurological:  Negative for seizures and syncope.  All other systems reviewed and are negative.   Updated Vital Signs BP (!) 119/101   Pulse 91   Temp 98.2 F (36.8 C) (Oral)   Resp 16   Ht 5' 8 (1.727 m)   Wt 53.5 kg   SpO2 100%   BMI 17.94 kg/m   Physical Exam Vitals and nursing note reviewed.  Constitutional:      General: He is not in acute distress.    Appearance: Normal appearance. He is well-developed. He is not ill-appearing.  HENT:     Head: Normocephalic and atraumatic.  Eyes:     Conjunctiva/sclera: Conjunctivae normal.  Cardiovascular:     Rate and Rhythm: Tachycardia present. Rhythm irregular.     Heart sounds: No murmur heard. Pulmonary:     Effort: Pulmonary effort is normal. No respiratory distress.     Breath sounds: Rhonchi present.  Abdominal:     Palpations: Abdomen is soft.     Tenderness: There is no abdominal tenderness.  Musculoskeletal:        General: No swelling.     Cervical back: Neck supple.  Skin:    General: Skin is warm and dry.     Capillary Refill: Capillary refill takes less than 2 seconds.  Neurological:     Mental Status: He is alert.  Psychiatric:        Mood and Affect: Mood normal.     (all labs ordered are listed, but only abnormal results are displayed) Labs Reviewed  BASIC METABOLIC PANEL WITH GFR - Abnormal; Notable for the following components:      Result Value   Sodium 134 (*)    Potassium 3.4 (*)    CO2 11 (*)    Glucose, Bld 104 (*)    Calcium  7.3 (*)    All other components within normal limits  BRAIN NATRIURETIC PEPTIDE - Abnormal; Notable for the following components:   B Natriuretic Peptide 100.3 (*)     All other components within normal limits  CBC WITH DIFFERENTIAL/PLATELET - Abnormal; Notable for the following components:   RBC 3.72 (*)    Hemoglobin 11.5 (*)    HCT 35.5 (*)    RDW 22.1 (*)    Platelets 149 (*)    nRBC 1.2 (*)    All other components within normal limits  HEPATIC FUNCTION PANEL - Abnormal; Notable for the following components:   AST 80 (*)    All other components within normal limits  I-STAT CHEM 8, ED - Abnormal; Notable for the following components:   Potassium 3.4 (*)  BUN 5 (*)    Glucose, Bld 105 (*)    Calcium , Ion 0.99 (*)    TCO2 13 (*)    All other components within normal limits  LIPASE, BLOOD  RAPID URINE DRUG SCREEN, HOSP PERFORMED  TSH  T4, FREE  MAGNESIUM   TROPONIN I (HIGH SENSITIVITY)  TROPONIN I (HIGH SENSITIVITY)    EKG: EKG Interpretation Date/Time:  Tuesday February 20 2024 14:30:38 EDT Ventricular Rate:  118 PR Interval:    QRS Duration:  74 QT Interval:  298 QTC Calculation: 418 R Axis:   61  Text Interpretation: Atrial fibrillation with RVR Ventricular premature complex Borderline repolarization abnormality Confirmed by Gennaro Bouchard ([id number]) on 02/20/2024 2:35:05 PM  Radiology: DG Chest 1 View Result Date: 02/20/2024 CLINICAL DATA:  Chest pain. EXAM: CHEST  1 VIEW COMPARISON:  02/04/2024 FINDINGS: The cardiac silhouette, mediastinal and hilar contours are normal. Stable emphysematous changes with hyperinflation and scattered bulla. No acute overlying pulmonary process no pleural effusions or pneumothorax. The bony thorax is intact. Remote healed right rib fractures. IMPRESSION: Emphysematous changes but no acute overlying pulmonary process. Electronically Signed   By: MYRTIS Stammer M.D.   On: 02/20/2024 15:03     Procedures   Medications Ordered in the ED  lactated ringers  bolus 500 mL (0 mLs Intravenous Stopped 02/20/24 1614)  HYDROmorphone  (DILAUDID ) injection 1 mg (1 mg Intravenous Given 02/20/24 1458)                                     Medical Decision Making Social determinants of health: History of narcotic abuse and chronic pain  Cardiac monitor interpretation: Heart rate between 160s to 200s  Patient here for chest pain abdominal pain elevated heart rate.  Has a history of WPW and A-fib.  He does not like he is in atrial fibrillation with a rapid ventricular response and does have a shortened PR interval.  Had a history of ablation in the past but has had episodes of WPW since.  I reviewed his chart and started him on procainamide .  Heart rate is down to the 140s.  Dr. Irena, cardiology was consulted and recommended procainamide  drip for now and they will evaluate the patient.  Patient was given IV pain control as well.  He otherwise appears very stable with negative lab workup so far.  Chest x-ray is unremarkable.    Cardiology evaluated the patient at bedside and recommended stopping procainamide  and they will start Cardizem and beta-blockers.  Family medicine teaching service at bedside they will admit the patient for further workup and management.  Patient is agreeable to plan.  Problems Addressed: Chest pain, unspecified type: acute illness or injury Chronic abdominal pain: chronic illness or injury WPW (Wolff-Parkinson-White syndrome): chronic illness or injury with exacerbation, progression, or side effects of treatment  Amount and/or Complexity of Data Reviewed External Data Reviewed: notes.    Details: Prior hospital records reviewed and ED record reviewed and patient has had WPW in the past and was treated with procainamide  Labs: ordered. Decision-making details documented in ED Course.    Details: Ordered and reviewed by me and fairly unremarkable, second troponin is pending Radiology: ordered and independent interpretation performed. Decision-making details documented in ED Course.    Details: Ordered and interpreted by me independently of radiology Chest x-ray: Shows no acute  abnormality ECG/medicine tests: ordered and independent interpretation performed. Decision-making details documented in ED Course.  Details: Ordered and interpreted by me in the absence of cardiology and shows WPW and A-fib with rapid ventricular response and nonspecific ST changes Discussion of management or test interpretation with external provider(s): Dr. Irena - cardiology -book with him on the phone regarding the patient and he recommended procainamide  for now and cardiology will come to the bedside and reassess  Dr. Waddell - family medicine- they will admit the patient for further workup and management   Risk OTC drugs. Prescription drug management. Parenteral controlled substances. Drug therapy requiring intensive monitoring for toxicity. Decision regarding hospitalization. Diagnosis or treatment significantly limited by social determinants of health. Risk Details: CRITICAL CARE Performed by: Duwaine LITTIE Fusi   Total critical care time: 45 minutes  Critical care time was exclusive of separately billable procedures and treating other patients.  Critical care was necessary to treat or prevent imminent or life-threatening deterioration.  Critical care was time spent personally by me on the following activities: development of treatment plan with patient and/or surrogate as well as nursing, discussions with consultants, evaluation of patient's response to treatment, examination of patient, obtaining history from patient or surrogate, ordering and performing treatments and interventions, ordering and review of laboratory studies, ordering and review of radiographic studies, pulse oximetry and re-evaluation of patient's condition.   Critical Care Total time providing critical care: 45 minutes     Final diagnoses:  WPW (Wolff-Parkinson-White syndrome)  Chest pain, unspecified type  Chronic abdominal pain  Atrial fibrillation with rapid ventricular response [facility])    ED  Discharge Orders     None          Fusi Duwaine LITTIE, DO 02/20/24 1631    Jamy Whyte L, DO 02/20/24 1635
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
## 2024-02-20 NOTE — Plan of Care (Signed)
 FMTS Interim Progress Note  S: He reports epigastric pain that radiates to the left upper quadrant.  Also reports that his chest feels heavy.  Improved somewhat.  No longer experiencing shortness of breath  O: BP 102/80   Pulse 64   Temp 98.6 F (37 C) (Oral)   Resp 18   Ht 5' 8 (1.727 m)   Wt 53.5 kg   SpO2 100%   BMI 17.94 kg/m    General: Well-appearing male sitting up in bed CV: Tachycardic, irregular rhythm, radial pulses 1+ Pulm: Breathing comfortably on room air, lungs clear to auscultation bilaterally Abdomen: Soft, mild tenderness to palpation in the epigastrium, delayed guarding with palpation of the epigastrium, otherwise no guarding or tenderness to palpation  A/P: A-fib with RVR.  Rates 110-120 while at bedside. - Cardiology consulted, appreciate recs - Diltiazem infusion running - Coreg  25 mg twice daily  Acute on chronic pancreatitis.  Oral intake worsens pain, but he is asking about advancing his diet now. -Clear liquid diet, advised caution with advancing intake due to pain -LR at 75 mL/h -Pain: Scheduled Tylenol  1000 mg every 6 hours, as needed oxy 5 every 4 hours -Consider CTA PE if pain acutely worsens overnight and is unresponsive to pain medication   Alena Morrison, Decarla Siemen, MD 02/20/2024, 8:52 PM PGY-1, [facility] Family Medicine Service pager [phone]

## 2024-02-20 NOTE — H&P (Addendum)
 Hospital Admission History and Physical Service Pager: [phone]  Patient name: [name] Medical record number: [id number] Date of Birth: 26-Nov-1970 Age: 53 y.o. Gender: male  Primary Care Provider: Janit Salines  Consultants: Cardiology  Code Status: Full code Preferred Emergency Contact:  Contact Information     Name Relation Home Work Mobile   Moodus Brother [phone]  [phone]   Arshia, Rondon Mother [phone]  [phone]      Other Contacts   None on File      Chief Complaint: tachycardia  Differential and Medical Decision Making:  [name] is a 53 y.o. male with a PMH of substance use disorder, anxiety/depression, Wolff-Parkinson-White syndrome, A-fib, chronic pancreatitis, seizures from alcohol  withdrawal, chronic bronchitis.    He presented with vomiting/diarrhea/abdominal pain that began last evening and tachycardia that began this morning. Differential for this patient's abdominal pain includes:  - Chronic pancreatitis: Likely given history, patient endorses eating fried chicken which is a known trigger for him, continued alcohol  and cigarette use - Viral source: Possible given acute onset with vomiting and diarrhea, can consider GI pathogen panel if diarrhea continues, he does not endorse any URI viral symptoms - Cholelithiasis: Less likely given location of abdominal pain, normal bilirubin  Assessment & Plan Atrial fibrillation with RVR (HCC) History of paroxysmal A-fib and Wolff-Parkinson-White syndrome but appears to be more in A-fib with RVR per cardiology.  Takes carvedilol  25 mg twice daily outpatient, okay to continue per cards.  Currently not rate controlled, will monitor on the drip. - Admit to FMTS, attending Dr. Delores -Vital signs per floor - Cardiology consulted, appreciate recommendations  - Diltiazem drip  - Recheck TSH with free T4 Abdominal pain Chronic pancreatitis (HCC) Acute on chronic, likely  chronic pancreatitis flare in the setting of eating fatty foods.  Begins in the epigastrium and radiates laterally, usual pain pattern but consider additional imaging if not improving. - Clear liquid diet, advance as tolerated - LR at 75 mL/hr x24 hours - Creon  3 times daily with meals - Multivitamin with minerals daily - Pepcid  40 mg daily - Protonix  40 mg daily - Sucralfate  1 g 3 times daily Pain regimen  - Tylenol  1000 mg Q6h scheduled - Oxycodone  5 mg every 4 hours as needed - Can consider CTAP if pain acutely worsens overnight Substance use disorder Chronic alcohol  use, patient reports 6 drinks/daily.  History of alcohol  withdrawal seizures X2, most recent last week.  Endorses THC use twice weekly.  Smokes half a pack of cigarettes a day.  Uses cocaine occasionally, states his last use was 02/08/2024. - Phenobarbital taper - CIWA precautions - Nicotine  patch - Thiamine  100 mg daily - Folic acid  1 mg daily - Multivitamin with minerals daily - For elevated CIWA's contact our team Chronic health problem GERD-continue home Protonix  40, sucralfate  1 g TID, and Pepcid  40 daily BPH-continue home tamsulosin  0.5 mg daily   FEN/GI: Clear liquid diet VTE Prophylaxis: Lovenox   Disposition: Cardiac telemetry  History of Present Illness:  [name] is a 53 y.o. male presenting with tachycardia after multiple episodes of vomiting  Reports acute diarrhea last night and vomiting. Still having diarrhea and abdominal pain. Central and goes to lower back. Feels like usual pancreatitis pain. Drinking 6 pack beer daily x years. Trying to cut back and wants to go to [facility].   Today reports he felt like a weight was on my chest and felt palpitations so came to the ED. Reports they shocked him  in the ambulance once. Chest pain about 30 mins prior to arrival to the ED. Reports he was panicking and felt like he was going to have a heart attack. Having Sob with dizziness. Intermittent  symptoms, but feels a little better. Has not missed any doses of meds. Felt pulse was 115-120 before coming to ED.  Reports his HR is up and down. As high as 205.   Drank alcohol  yesterday and smoked marijuana two days ago. Denies recent cocaine use.  Reports his pancreas is so messed up from drinking that lab. Had two pieces of fried chicken yesterday.   Denies cough or sputum production.  In the ED, patient was tachycardic up to 166 and tachypneic up to 25, with a systolic up to 140 and diastolic up to 101.  A UDS was performed which was positive for opiates. A BNP was 100.3.  Troponin 5, second pending collection.  Hemoglobin 11.5 stable at baseline.  AST was 80, ALT and alk phos within normal limits.  A chest x-ray was obtained which showed emphysematous changes but no acute process.  EKG showed A-fib with RVR, ventricular premature complex, and borderline repolarization abnormality.  Patient was started on a procainamide  drip, given a total of liter LR, and 1 mg Dilaudid .  Cardiology was consulted.  Review Of Systems: Per HPI   Pertinent Past Medical History: Alcohol  abuse Anemia Anxiety Asthma Bipolar disorder Chronic bronchitis Chronic pancreatitis Cocaine abuse Depression GERD HTN Marijuana abuse Migraine PTSD Seizures Sickle cell trait Wolff-Parkinson-White  Remainder reviewed in history tab.   Pertinent Past Surgical History: Umbilical hernia repair Multiple EGD/EUS Eye surgery and facial fracture all surgery in the 1990s  Remainder reviewed in history tab.  Pertinent Social History: Tobacco use: 1/2 pack/day x 40 years Alcohol  use: 6 pack daily x 5 years Other Substance use: Cocaine - last used 10/2. Marijuana twice per week.  Lives with mother and grandson  Pertinent Family History: Father - alcoholism, cirrhosis, HTN, melanoma Mother - HTN  Important Outpatient Medications: *Took AM meds  Carvedilol  25 mg twice daily Famotidine  40 mg twice  daily Creon  36,000 units 2 tablets 3 times daily Imodium  4 times daily as needed Magnesium  oxide 400 mg daily Multivitamin Nicotine  patch Zofran  ODT 4 mg (twice per week) Oxycodone -acetaminophen  5-325 mg every 6 hours (10mg  daily?) Pantoprazole  40 mg daily MiraLAX  daily as needed Carafate  1 g 3 times daily Flomax  0.4 mg daily   Objective: BP (!) 119/101   Pulse 91   Temp 98.6 F (37 C) (Oral)   Resp 16   Ht 5' 8 (1.727 m)   Wt 53.5 kg   SpO2 100%   BMI 17.94 kg/m  Exam: General: Thin male, NAD Cardiovascular: Irregularly irregular, tachycardic Respiratory: CTAB, normal work of breathing on room air Gastrointestinal: Thin, soft, nondistended, tender to palpation in epigastric and LUQ MSK: Able to move all limbs grossly equally Derm: No skin lesions noted Neuro: No gross focal deficits Psych: Mood and affect appropriate  Labs:  CBC BMET  Recent Labs  Lab 02/20/24 1418 02/20/24 1436  WBC 5.1  --   HGB 11.5* 14.3  HCT 35.5* 42.0  PLT 149*  --    Recent Labs  Lab 02/20/24 1418 02/20/24 1436  NA 134* 140  K 3.4* 3.4*  CL 109 111  CO2 11*  --   BUN 7 5*  CREATININE 0.71 0.80  GLUCOSE 104* 105*  CALCIUM  7.3*  --     Pertinent additional labs lipase 24,  BNP 100.3, troponin 6, AST 80, UDS positive for opiates.   EKG: A-fib with RVR, no ST elevation  Imaging Studies Performed:  Chest x-ray Impression from Radiologist: Emphysematous changes but no acute overlying pulmonary process  My Interpretation: Hyperinflation, no focal consolidation, no pneumothorax, no pleural effusion   Lennie Raguel MATSU, DO 02/20/2024, 6:30 PM PGY-1, [facility] Family Medicine  FPTS Intern pager: [phone], text pages welcome Secure chat group [facility] Teaching Service   Upper Level Addendum:   I have seen and evaluated this patient along with Dr. Lennie and reviewed the above note, making necessary revisions as appropriate.  I agree with the medical  decision making and physical exam as noted above.   Izetta Nap, DO PGY-3, [facility] Family Medicine Residency

## 2024-02-20 NOTE — ED Triage Notes (Signed)
 PT to er from home with CO tachycardia.  PT HR was in the 180s per ems. PER EMS 2o mg Cardizem , 4 mg Zofran , and 4 mg of morphine  en route. PT hr came down to the 140's. PT has a 22g in his right hand.  PT has also had about 100cc of NS. PT endorses nausea, vomiting and diarrhea since last night. PT reports productive cough for the past week.  EMS reports rhonchi in all lobes except the right lower lobe.

## 2024-02-20 NOTE — Assessment & Plan Note (Addendum)
 Chronic alcohol  use, patient reports 6 drinks/daily.  History of alcohol  withdrawal seizures X2, most recent last week.  Endorses THC use twice weekly.  Smokes half a pack of cigarettes a day.  Uses cocaine occasionally, states his last use was 02/08/2024. - Phenobarbital taper - CIWA precautions - Nicotine  patch - Thiamine  100 mg daily - Folic acid  1 mg daily - Multivitamin with minerals daily - For elevated CIWA's contact our team

## 2024-02-20 NOTE — Hospital Course (Addendum)
 This is a 53 year old male  with a PMH of substance use disorder, anxiety/depression, Wolff-Parkinson-White syndrome, A-fib, chronic pancreatitis, seizures from alcohol  withdrawal, chronic bronchitis who was admitted to the [facility] family medicine teaching service for atrial fibrillation with RVR and acute on chronic pancreatitis.  A-fib with RVR Patient presented with chest heaviness and palpitations with rapid ventricular response on admission.  Suspect RVR in the setting of alcohol  use and dehydration. Cardiology consulted and continued patient's outpatient carvedilol  25 mg twice daily and started diltiazem drip. TSH and free T4 normal. Initially transitioned to PO diltiazem but was placed back on drip given HR remained elevated. One time dose of digoxin  was administered 10/16. HR stabilized to goal of < 115 bpm and patient was weaned off IV diltiazem drip and transitioned back to PO dilitazem. HR remained at goal throughout the remainder of his hospital stay.  Acute on chronic pancreatitis Patient presented with nausea, vomiting, epigastric pain.  Lipase within normal limits. Patient placed on clear liquid diet and lactated Ringer 's while limited PO intake.  Pain management with Tylenol  and oxycodone  5mg  (later increased to 7.5mg ). Symptomatic treatment with Creon , pepcid , protonix , sucralfate  was adjusted as needed. Diet advanced as tolerated until he was able to eat regular diet. CTA PE and was ordered to rule out PE vs aortic dissection given patient continued to complain of sharp, central chest pain with radiation to the left chest wall. CTA PE was negative for PE/dissection but did show a 4cm pancreatic pseudocyst increased from previous imaging for which GI was consulted and recommenced no further interventinos as cyst is too small to drain. Patient was able to tolerate a regular diet at the time of discharge.***  Polysubstance Use Disorder Patient with history of chronic alcohol  use and  history of alcohol  withdrawal seizures x2, most recently the week before admission. Patient was placed on CIWA precautions and started on a phenobarbital taper. CIWAs remained low and patient did not experience withdrawal symptoms while admitted.  Hypomagnesemia Magnesium  remained low throughout admission and was repleted as needed.  Anemia Hgb 11.5 on admission which decreased to 8.1. Patient was initially on IV fluids but this is likely non-contributory as fluids were decreased prior to downtrending Hgb. Repeat CBC prior to discharge showed stable Hgb 8.5 This seems be a 2/2 IDA from chart review and patient has previously been started on oral iron supplementation.  Other chronic conditions were medically managed with home medications and formulary alternatives as necessary (GERD, BPH)  PCP recommendations: Alcohol  cessation counseling Repeat CBC

## 2024-02-20 NOTE — ED Notes (Signed)
 Primary RN bedside with pharmacist and DO.

## 2024-02-20 NOTE — ED Notes (Signed)
 Called and placed PT on monitor with CCMD

## 2024-02-21 ENCOUNTER — Inpatient Hospital Stay (HOSPITAL_COMMUNITY): Payer: MEDICAID

## 2024-02-21 DIAGNOSIS — I4891 Unspecified atrial fibrillation: Secondary | ICD-10-CM | POA: Diagnosis not present

## 2024-02-21 LAB — COMPREHENSIVE METABOLIC PANEL WITH GFR
ALT: 28 U/L (ref 0–44)
AST: 57 U/L — ABNORMAL HIGH (ref 15–41)
Albumin: 3.3 g/dL — ABNORMAL LOW (ref 3.5–5.0)
Alkaline Phosphatase: 82 U/L (ref 38–126)
Anion gap: 11 (ref 5–15)
BUN: 6 mg/dL (ref 6–20)
CO2: 12 mmol/L — ABNORMAL LOW (ref 22–32)
Calcium: 7.1 mg/dL — ABNORMAL LOW (ref 8.9–10.3)
Chloride: 107 mmol/L (ref 98–111)
Creatinine, Ser: 0.81 mg/dL (ref 0.61–1.24)
GFR, Estimated: >60 mL/min (ref >60)
Glucose, Bld: 154 mg/dL — ABNORMAL HIGH (ref 70–99)
Potassium: 4.6 mmol/L (ref 3.5–5.1)
Sodium: 130 mmol/L — ABNORMAL LOW (ref 135–145)
Total Bilirubin: 0.9 mg/dL (ref 0.0–1.2)
Total Protein: 6.4 g/dL — ABNORMAL LOW (ref 6.5–8.1)

## 2024-02-21 LAB — CBC
HCT: 35.6 % — ABNORMAL LOW (ref 39.0–52.0)
Hemoglobin: 11 g/dL — ABNORMAL LOW (ref 13.0–17.0)
MCH: 31.3 pg (ref 26.0–34.0)
MCHC: 30.9 g/dL (ref 30.0–36.0)
MCV: 101.1 fL — ABNORMAL HIGH (ref 80.0–100.0)
Platelets: 128 K/uL — ABNORMAL LOW (ref 150–400)
RBC: 3.52 MIL/uL — ABNORMAL LOW (ref 4.22–5.81)
RDW: 22.5 % — ABNORMAL HIGH (ref 11.5–15.5)
WBC: 7.2 K/uL (ref 4.0–10.5)
nRBC: 0.6 % — ABNORMAL HIGH (ref 0.0–0.2)

## 2024-02-21 LAB — TROPONIN I (HIGH SENSITIVITY): Troponin I (High Sensitivity): 7 ng/L (ref <18)

## 2024-02-21 LAB — MAGNESIUM: Magnesium: 2 mg/dL (ref 1.7–2.4)

## 2024-02-21 MED ORDER — DIPHENHYDRAMINE HCL 50 MG/ML IJ SOLN
50.0000 mg | Freq: Once | INTRAMUSCULAR | Status: AC
  Administered 2024-02-21: 50 mg via INTRAVENOUS
  Filled 2024-02-21: qty 1

## 2024-02-21 MED ORDER — LIDOCAINE 5 % EX PTCH
1.0000 | MEDICATED_PATCH | CUTANEOUS | Status: DC
  Administered 2024-02-21 – 2024-02-23 (×3): 1 via TRANSDERMAL
  Filled 2024-02-21 (×3): qty 1

## 2024-02-21 MED ORDER — MELATONIN 3 MG PO TABS
3.0000 mg | ORAL_TABLET | Freq: Every evening | ORAL | Status: AC | PRN
  Administered 2024-02-21 – 2024-02-24 (×5): 3 mg via ORAL
  Filled 2024-02-21 (×5): qty 1

## 2024-02-21 MED ORDER — ACETAMINOPHEN 500 MG PO TABS
1000.0000 mg | ORAL_TABLET | Freq: Four times a day (QID) | ORAL | Status: DC
  Administered 2024-02-21 – 2024-03-01 (×31): 1000 mg via ORAL
  Filled 2024-02-21 (×34): qty 2

## 2024-02-21 MED ORDER — DILTIAZEM HCL-DEXTROSE 125-5 MG/125ML-% IV SOLN (PREMIX)
20.0000 mg/h | INTRAVENOUS | Status: DC
  Administered 2024-02-21: 15 mg/h via INTRAVENOUS
  Administered 2024-02-21: 5 mg/h via INTRAVENOUS
  Administered 2024-02-22: 20 mg/h via INTRAVENOUS
  Administered 2024-02-22: 15 mg/h via INTRAVENOUS
  Administered 2024-02-22 – 2024-02-23 (×4): 20 mg/h via INTRAVENOUS
  Filled 2024-02-21 (×9): qty 125

## 2024-02-21 MED ORDER — NICOTINE 21 MG/24HR TD PT24
21.0000 mg | MEDICATED_PATCH | Freq: Every day | TRANSDERMAL | Status: DC
  Administered 2024-02-21 – 2024-03-01 (×10): 21 mg via TRANSDERMAL
  Filled 2024-02-21 (×10): qty 1

## 2024-02-21 MED ORDER — METHYLPREDNISOLONE SODIUM SUCC 40 MG IJ SOLR
40.0000 mg | Freq: Once | INTRAMUSCULAR | Status: AC
  Administered 2024-02-21: 40 mg via INTRAVENOUS
  Filled 2024-02-21: qty 1

## 2024-02-21 MED ORDER — LIDOCAINE 5 % EX PTCH
1.0000 | MEDICATED_PATCH | CUTANEOUS | Status: DC
  Administered 2024-02-21 – 2024-02-23 (×2): 1 via TRANSDERMAL
  Filled 2024-02-21 (×2): qty 1

## 2024-02-21 MED ORDER — ALUM & MAG HYDROXIDE-SIMETH 200-200-20 MG/5ML PO SUSP
30.0000 mL | Freq: Once | ORAL | Status: AC
  Administered 2024-02-21: 30 mL via ORAL
  Filled 2024-02-21: qty 30

## 2024-02-21 MED ORDER — OXYCODONE HCL 5 MG PO TABS
5.0000 mg | ORAL_TABLET | Freq: Once | ORAL | Status: AC
  Administered 2024-02-21: 5 mg via ORAL
  Filled 2024-02-21: qty 1

## 2024-02-21 MED ORDER — MAGNESIUM SULFATE 2 GM/50ML IV SOLN
2.0000 g | Freq: Once | INTRAVENOUS | Status: AC
  Administered 2024-02-21: 2 g via INTRAVENOUS
  Filled 2024-02-21: qty 50

## 2024-02-21 MED ORDER — DILTIAZEM HCL ER COATED BEADS 120 MG PO CP24
120.0000 mg | ORAL_CAPSULE | Freq: Every day | ORAL | Status: DC
  Administered 2024-02-21: 120 mg via ORAL
  Filled 2024-02-21 (×2): qty 1

## 2024-02-21 MED ORDER — IOHEXOL 350 MG/ML SOLN
75.0000 mL | Freq: Once | INTRAVENOUS | Status: AC | PRN
  Administered 2024-02-21: 75 mL via INTRAVENOUS

## 2024-02-21 MED ORDER — ACETAMINOPHEN 325 MG PO TABS: 650.0000 mg | ORAL_TABLET | Freq: Four times a day (QID) | ORAL | Status: DC

## 2024-02-21 MED ORDER — OXYCODONE HCL 5 MG PO TABS
5.0000 mg | ORAL_TABLET | ORAL | Status: AC | PRN
Start: 2024-02-21 — End: ?
  Administered 2024-02-21 – 2024-02-23 (×10): 5 mg via ORAL
  Filled 2024-02-21 (×11): qty 1

## 2024-02-21 NOTE — ED Notes (Signed)
 Security at bedside inventorying items and confiscating any prohibited items for pt to pick up upon discharge.

## 2024-02-21 NOTE — Plan of Care (Signed)
 RN message regarding patient's request for more pain medication. Patient seen at bedside reporting specifically sharp central chest pain radiating to the left chest wall. Some SOB. No significant epigastric pain. Pain is not reproducible with palpation. HR 120-140 while at bedside. Patient overall well appearing. Ordered  EKG and troponin. Ordered CTA PE to rule out PE vs dissection as cause of pain and tachycardia; however, patient has documented hives reaction with contrast. Will pretreat with steroids and benadryl . Spot dose oxycodone  5 mg now for pain.  Jason Art MD Family Medicine PGY1

## 2024-02-21 NOTE — Assessment & Plan Note (Addendum)
 Currently not rate controlled. HR remains elevated (107-162) with EKG showing A-fib with RVR. Diltiazem drip held overnight 2/2 BP 75/61, 86/58; now restarted. - Cardiology consulted, appreciate recommendations - Goal HR < 115 - Transition to Diltiazem 120mg  PO - TSH 0.913 and T4 0.74 - suppressed on recent labs but now normal - Consider adding digoxin  if rates remain poorly controlled - Patient is not a candidate for anticoagulation or anti-arrhythmic drug therapy d/t polysubstance abuse and non-compliance per cardiology.  - Can consider Afib ablation if patient can demonstrate long term abstinence from substanace use. - Continue Carvedilol  25mg  BID

## 2024-02-21 NOTE — Assessment & Plan Note (Signed)
 Mg 0.8 this AM, now 2.0 after repletion - AM CMP, Mg - Replete as needed

## 2024-02-21 NOTE — ED Notes (Signed)
 PT keeps ambulating to the bathroom with gloves on.

## 2024-02-21 NOTE — Plan of Care (Signed)

## 2024-02-21 NOTE — Progress Notes (Signed)
 Daily Progress Note Intern Pager: [phone]  Patient name: [name] Medical record number: [id number] Date of birth: May 17, 1970 Age: 53 y.o. Gender: male  Primary Care Provider: Janit Salines Consultants: Cardiology Code Status: Full code  Pt Overview and Major Events to Date:  10/14 - Admitted  Assessment and Plan:  Xavious Naethan Bracewell is a 53 y.o. male with a PMH of paroxysmal A-fib, WPW s/p ablation, chronic pancreatitis, polysubstance use disorder, seizures from alcohol  withdrawal, anxiety/depression, chronic bronchitis who presented with vomiting, diarrhea, abdominal pain likely 2/2 chronic pancreatitis. Patient was also tachycardic on admission and admitted for A-fib w/ RVR, likely 2/2 chronic EtOH use and dehydration. Assessment & Plan Atrial fibrillation with RVR (HCC) Currently not rate controlled. HR remains elevated (107-162) with EKG showing A-fib with RVR. Diltiazem drip held overnight 2/2 BP 75/61, 86/58; now restarted. - Cardiology consulted, appreciate recommendations - Goal HR < 115 - Transition to Diltiazem 120mg  PO - TSH 0.913 and T4 0.74 - suppressed on recent labs but now normal - Consider adding digoxin  if rates remain poorly controlled - Patient is not a candidate for anticoagulation or anti-arrhythmic drug therapy d/t polysubstance abuse and non-compliance per cardiology.  - Can consider Afib ablation if patient can demonstrate long term abstinence from substanace use. - Continue Carvedilol  25mg  BID Abdominal pain Chronic pancreatitis (HCC) Still c/o LUQ pain. Acute on chronic, likely chronic pancreatitis flare in the setting of eating fatty foods.  - Clear liquid diet, advance as tolerated - LR at 75 mL/hr x24 hours - Creon  TID with meals - Multivitamin with minerals daily - Pepcid  40 mg daily - Protonix  40 mg daily - Sucralfate  1 g 3 times daily - Pain regimen  - Try K-pad on back, lidoderm  patch to chest/abdomen - Tylenol  1000 mg  q6hrs scheduled - Oxycodone  5 mg q4hrs PRN - If asking for another dose w/in 4 hours, okay to give x1 - Avoid Benadryl  with oxycodone  - Can consider CTAP if pain acutely worsens overnight - Added GI cocktail - Consult GI about pseudocyst found in CTA (see results below) Substance use disorder Found smoking in the bathroom this AM. Chronic alcohol  use with h/o alcohol  withdrawal seizures x2, most recently last week. Also uses cigarettes, THC, and cocaine. - Continue Phenobarbital taper - Increased Nicotine  patch - Continue Thiamine  100 mg daily - Continue Folic acid  1 mg daily - Continue Multivitamin with minerals daily - CIWA precautions - For elevated CIWA's contact our team Hypomagnesemia Mg 0.8 this AM, now 2.0 after repletion - AM CMP, Mg - Replete as needed Chronic health problem GERD - continue home Protonix  40mg  daily, sucralfate  1 g TID, and Pepcid  40mg  daily BPH - continue home tamsulosin  0.4 mg daily  FEN/GI: Clear liquids, advance as tolerated PPx: Lovenox  Dispo:Home pending clinical improvement . Barriers include rate control.   Subjective:  ON: HR 107-162, overall well-appearing. Patient c/o epigastric/LUQ pain, chest heaviness and sharp, central CP with radiation to L chest wall. Workup included EKG which showed A-fib and troponin of 7 to rule out MI. CTA PE also ordered to r/o PE/aortic dissection given tachycardia and abdominal pain which showed no evidence of PE or aortic dissection. Other findings as below. Patient was pre-treated with steroids and benadryl  for documented allergy to contrast. Diltiazem drip was held 2/2 soft BPs.  This morning, patient still complaining of chest heaviness, central chest pain with radiation to the L chest wall and LUQ abdominal pain. Patient requested oxycodone  which I explained  we are holding due to SBP < 90. Patient would also like to try a GI cocktail if possible abdominal pain.   Patient reports nodule behind the L ear. First  noticed a few weeks ago, unchanged, denies itching, pain at the site.  Per nursing report, patient found smoking in the bathroom this AM.  Objective: Temp:  [97.7 F (36.5 C)-98.6 F (37 C)] 97.7 F (36.5 C) (10/15 0530) Pulse Rate:  [34-150] 116 (10/15 0700) Resp:  [11-29] 16 (10/15 0700) BP: (75-140)/(51-101) 110/98 (10/15 0700) SpO2:  [82 %-100 %] 82 % (10/15 0700) Weight:  [53.5 kg] 53.5 kg (10/14 1359)  Physical Exam: General: Alert, thin, in NAD Neck: Supple. ~1.5-2 cm behind the L ear that is soft, mobile and not painful, non erythematous Cardiovascular: Irregular, tachycardic. Pulmonary: Normal WOB. CTAB with no w/c/r present. Abdomen: Soft, non-distended, TTP in LUQ Extremities: Warm and well-perfused, without cyanosis or edema. Neurologic: Normal gait, moves all four extremities appropriately  Laboratory: Most recent CBC Lab Results  Component Value Date   WBC 7.2 02/21/2024   HGB 11.0 (L) 02/21/2024   HCT 35.6 (L) 02/21/2024   MCV 101.1 (H) 02/21/2024   PLT 128 (L) 02/21/2024   Most recent BMP    Latest Ref Rng & Units 02/21/2024    2:39 AM  BMP  Glucose 70 - 99 mg/dL 845   BUN 6 - 20 mg/dL 6   Creatinine 9.38 - 8.75 mg/dL 9.18   Sodium 864 - 854 mmol/L 130   Potassium 3.5 - 5.1 mmol/L 4.6   Chloride 98 - 111 mmol/L 107   CO2 22 - 32 mmol/L 12   Calcium  8.9 - 10.3 mg/dL 7.1     Other pertinent labs: Troponin 7 BNP 100.3 TSH 0.913, T4 0.74 HIV screen non-reactive UDS positive for opiates   Imaging/Diagnostic Tests:  CT Angio Chest Pulmonary Embolism (PE) W or WO Contrast 1. No evidence of pulmonary embolism or aortic dissection. Mild aortic atherosclerosis.  2. Small anterior pericardial effusion.  3. Pancreatic parenchymal calcifications compatible with chronic calcific pancreatitis and a 4 cm pancreatic body cystic structure, likely a pseudocyst, increased from 2.3 cm on prior studies from 2022 and 2023. unchanged compared with more recent  studies.  DG Chest 1 View Emphysematous changes but no acute overlying pulmonary process.   Jerrie Gathers, DO 02/21/2024, 7:21 AM  PGY-1, [facility] Family Medicine FPTS Intern pager: [phone], text pages welcome Secure chat group [facility] Teaching Service

## 2024-02-21 NOTE — Assessment & Plan Note (Addendum)
 Found smoking in the bathroom this AM. Chronic alcohol  use with h/o alcohol  withdrawal seizures x2, most recently last week. Also uses cigarettes, THC, and cocaine. - Continue Phenobarbital taper - Increased Nicotine  patch - Continue Thiamine  100 mg daily - Continue Folic acid  1 mg daily - Continue Multivitamin with minerals daily - CIWA precautions - For elevated CIWA's contact our team

## 2024-02-21 NOTE — Progress Notes (Signed)
 While RN assessing patient, 2 cans of tobacco dip was found in patient nightstand. Reading previous notes and per patient, security has already confiscated his cigarettes and other cans of tobacco. This RN informed patient that she saw cans and security will be called to confiscate. Patient was okay with that. He then said I have to get something from my bag. This RN was not in room when patient went to his bag. Security called and this RN was informed that security does not have to take patient's dip cans even though others were confiscated.    RN educated patient on importance of not using tobacco in the room. Team aware.

## 2024-02-21 NOTE — Progress Notes (Signed)
  Progress Note  Patient Name: [name] Date of Encounter: 02/21/2024 [facility] Cardiologist: Lynwood Schilling, MD   Interval Summary    Complains of continued palpitations, chest pressure.  Was found smoking in the bathroom this morning by staff.  Ask for pain medication on scheduled basis.  Vital Signs Vitals:   02/21/24 0738 02/21/24 0753 02/21/24 0800 02/21/24 0821  BP: (!) 125/113 117/89 (!) 105/90 96/77  Pulse: (!) 156  (!) 147 (!) 154  Resp: (!) 7  (!) 33   Temp: 98.4 F (36.9 C)     TempSrc: Oral     SpO2: 100%  94% 95%  Weight:      Height:        Intake/Output Summary (Last 24 hours) at 02/21/2024 0826 Last data filed at 02/21/2024 9361 Gross per 24 hour  Intake 789.68 ml  Output --  Net 789.68 ml      02/20/2024    1:59 PM 02/04/2024   12:39 PM 01/20/2024   12:25 AM  Last 3 Weights  Weight (lbs) 118 lb 118 lb 123 lb 7.3 oz  Weight (kg) 53.524 kg 53.524 kg 56 kg      Telemetry/ECG   Atrial fibrillation with RVR with rates into the 160-170bpm range with episodes abberrancy- Personally Reviewed  Physical Exam  GEN: No acute distress.   Neck: No JVD Cardiac: Irreg Irreg, + systolic murmur, no rubs, or gallops.  Respiratory: Clear to auscultation bilaterally. GI: Soft, nontender, non-distended  MS: No edema  Assessment & Plan   53 y.o. 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 Gennaro.   Atrial fibrillation with RVR with episodic aberrancy History of WPW -- Initially started on procainamide  in the ED, this was stopped and transition to IV diltiazem along with Coreg  25 mg twice daily -- IV diltiazem was  stopped overnight secondary to soft BP.  Blood pressure has improved this morning and has been resumed.  Remains on Coreg  25 mg twice daily -- If rates remain poorly controlled could consider adding digoxin  -- Unfortunately his options are limited as he is not anticoagulated in the setting of noncompliance as well as polysubstance abuse including alcohol .  Therefore not a candidate for cardioversion or IV amiodarone  at this point. -- will supplement addition IV mag -- TSH 0.913, free T4 0.74  Polysubstance abuse -- History of cocaine abuse, EtOH use states he drinks about a sixpack per day -- Suspect this is admitting to his atrial fibrillation  Per Primary team Abdominal pain Chronic pancreatitis GERD BPH  For questions or updates, please contact [facility] Please consult [email or web address] for contact info under   Signed, Manuelita Rummer, NP

## 2024-02-21 NOTE — Plan of Care (Signed)
 FMTS Brief Progress Note  S: Patient seen at bedside with Dr. elicia, reporting persistent chest heaviness compared to yesterday and subjective troubles breathing due to chest heaviness.  He asks about getting something to help with sleep and other medications for his pain.   O: BP 118/88 (BP Location: Right Arm)   Pulse (!) 131   Temp 97.6 F (36.4 C) (Oral)   Resp 20   Ht 5' 8 (1.727 m)   Wt 53.5 kg   SpO2 99%   BMI 17.94 kg/m    General: Well-appearing male sitting up in bed CV: Tachycardic, irregular rhythm, 2+ radial pulse, no pretibial edema Pulm: Lungs clear to auscultation bilaterally, SpO2 100% on room air, no increased work of breathing Abdomen: Nondistended  Telemetry reviewed with persistent RVR with numerous runs HR greater than 150  A/P: -Plan to continue diltiazem drip, will monitor MAP -Pain control: Continue Tylenol  1000 mg every 6 hours scheduled, reach out to RN about missed afternoon dose; oxycodone  5 every 4 hours as needed, which he is getting consistently -Ordered melatonin 3 mg nightly as needed - Orders reviewed. Labs for AM ordered, which was adjusted as needed.  - If condition changes, plan includes if he becomes vitally unstable, will reach out to cards for recommendations in addition to the diltiazem drip.  If epigastric pain becomes more of a problem, which was not mentioned when I saw him at bedside, we will plan to transition to n.p.o. and add back LR  Alena Morrison, Elio, MD 02/21/2024, 7:42 PM PGY-1, [facility] Night Resident  Please page [phone] with questions.

## 2024-02-21 NOTE — ED Notes (Signed)
 Pt seen going into the bathroom with gloves on.

## 2024-02-21 NOTE — Progress Notes (Signed)
 Patient arrived to 4E10 from ED A&O x4 and INAD.  Telemetry monitor applied and CCMD notified.  CHG bath and skin assessment completed.  Patient's HR noted to be 120's to 150's at rest.  MD made aware and reported to bedside.  New orders placed.  Patient oriented to unit and room to include call light and phone.

## 2024-02-21 NOTE — ED Notes (Signed)
 PT told the present nurse: I can't wait another hour for my pain medication.

## 2024-02-21 NOTE — Assessment & Plan Note (Addendum)
 Still c/o LUQ pain. Acute on chronic, likely chronic pancreatitis flare in the setting of eating fatty foods.  - Clear liquid diet, advance as tolerated - LR at 75 mL/hr x24 hours - Creon  TID with meals - Multivitamin with minerals daily - Pepcid  40 mg daily - Protonix  40 mg daily - Sucralfate  1 g 3 times daily - Pain regimen  - Try K-pad on back, lidoderm  patch to chest/abdomen - Tylenol  1000 mg q6hrs scheduled - Oxycodone  5 mg q4hrs PRN - If asking for another dose w/in 4 hours, okay to give x1 - Avoid Benadryl  with oxycodone  - Can consider CTAP if pain acutely worsens overnight - Added GI cocktail - Consult GI about pseudocyst found in CTA (see results below)

## 2024-02-21 NOTE — ED Notes (Signed)
 Pt asked to ambulate to the restroom. While getting up, pt seen to have a can of dip on his person. The can of dip was placed in a belonging bag and held at the nurse's station. Pt told he was not allowed to have use or have any kind of tobacco on the property. He admitted to using tobacco yesterday when he first arrived. Pt then became irritated that he could not keep the dip on his person and said, Well that's ok, I'll just have someone else bring me some. I'll just have her stick it in her pussy. Charge RN made aware. Security made aware and to bedside to discuss the situation with the pt. Pt not to have any visitors or items brought to the room without the items being screened.

## 2024-02-21 NOTE — Assessment & Plan Note (Signed)
 GERD - continue home Protonix  40mg  daily, sucralfate  1 g TID, and Pepcid  40mg  daily BPH - continue home tamsulosin  0.4 mg daily

## 2024-02-21 NOTE — ED Notes (Signed)
Pt threatening to leave AMA

## 2024-02-21 NOTE — ED Notes (Signed)
 Pt found smoking in  restroom by fellow nurse. Accompanied back to room and placed on monitor

## 2024-02-22 DIAGNOSIS — I4891 Unspecified atrial fibrillation: Secondary | ICD-10-CM | POA: Diagnosis not present

## 2024-02-22 LAB — MAGNESIUM: Magnesium: 1.2 mg/dL — ABNORMAL LOW (ref 1.7–2.4)

## 2024-02-22 LAB — COMPREHENSIVE METABOLIC PANEL WITH GFR
ALT: 23 U/L (ref 0–44)
AST: 30 U/L (ref 15–41)
Albumin: 2.9 g/dL — ABNORMAL LOW (ref 3.5–5.0)
Alkaline Phosphatase: 65 U/L (ref 38–126)
Anion gap: 9 (ref 5–15)
BUN: <5 mg/dL — ABNORMAL LOW (ref 6–20)
CO2: 16 mmol/L — ABNORMAL LOW (ref 22–32)
Calcium: 7.1 mg/dL — ABNORMAL LOW (ref 8.9–10.3)
Chloride: 111 mmol/L (ref 98–111)
Creatinine, Ser: 0.72 mg/dL (ref 0.61–1.24)
GFR, Estimated: >60 mL/min (ref >60)
Glucose, Bld: 144 mg/dL — ABNORMAL HIGH (ref 70–99)
Potassium: 3.5 mmol/L (ref 3.5–5.1)
Sodium: 136 mmol/L (ref 135–145)
Total Bilirubin: 0.4 mg/dL (ref 0.0–1.2)
Total Protein: 5.5 g/dL — ABNORMAL LOW (ref 6.5–8.1)

## 2024-02-22 LAB — CBC
HCT: 26.3 % — ABNORMAL LOW (ref 39.0–52.0)
HCT: 29 % — ABNORMAL LOW (ref 39.0–52.0)
Hemoglobin: 8.7 g/dL — ABNORMAL LOW (ref 13.0–17.0)
Hemoglobin: 9.4 g/dL — ABNORMAL LOW (ref 13.0–17.0)
MCH: 31.8 pg (ref 26.0–34.0)
MCH: 31.5 pg (ref 26.0–34.0)
MCHC: 33.1 g/dL (ref 30.0–36.0)
MCHC: 32.4 g/dL (ref 30.0–36.0)
MCV: 96 fL (ref 80.0–100.0)
MCV: 97.3 fL (ref 80.0–100.0)
Platelets: 127 K/uL — ABNORMAL LOW (ref 150–400)
Platelets: 129 K/uL — ABNORMAL LOW (ref 150–400)
RBC: 2.74 MIL/uL — ABNORMAL LOW (ref 4.22–5.81)
RBC: 2.98 MIL/uL — ABNORMAL LOW (ref 4.22–5.81)
RDW: 21.7 % — ABNORMAL HIGH (ref 11.5–15.5)
RDW: 22.2 % — ABNORMAL HIGH (ref 11.5–15.5)
WBC: 9.2 K/uL (ref 4.0–10.5)
WBC: 8.8 K/uL (ref 4.0–10.5)
nRBC: 0 % (ref 0.0–0.2)
nRBC: 0 % (ref 0.0–0.2)

## 2024-02-22 MED ORDER — NICOTINE POLACRILEX 2 MG MT GUM: 2.0000 mg | CHEWING_GUM | OROMUCOSAL | Status: DC | PRN

## 2024-02-22 MED ORDER — MAGNESIUM SULFATE 2 GM/50ML IV SOLN: 2.0000 g | Freq: Once | INTRAVENOUS | Status: DC

## 2024-02-22 MED ORDER — DIGOXIN 0.25 MG/ML IJ SOLN
0.2500 mg | Freq: Once | INTRAMUSCULAR | Status: AC
  Administered 2024-02-22: 0.25 mg via INTRAVENOUS
  Filled 2024-02-22: qty 1

## 2024-02-22 MED ORDER — MAGNESIUM SULFATE 4 GM/100ML IV SOLN
4.0000 g | Freq: Once | INTRAVENOUS | Status: AC
  Administered 2024-02-22: 4 g via INTRAVENOUS
  Filled 2024-02-22: qty 100

## 2024-02-22 NOTE — Progress Notes (Signed)
  Progress Note  Patient Name: [name] Date of Encounter: 02/22/2024 [facility] Cardiologist: Lynwood Schilling, MD   Interval Summary    C/o left sided chest pain pain with radiation up into his left ear, worse with faster HRs. Abd pain is better, asking for regular diet.   Vital Signs Vitals:   02/22/24 0500 02/22/24 0530 02/22/24 0600 02/22/24 0630  BP: 107/80 104/68 99/69 93/61   Pulse: (!) 105     Resp:  18 (!) 25 17  Temp:      TempSrc:      SpO2: 100% 100% 100% 99%  Weight:      Height:        Intake/Output Summary (Last 24 hours) at 02/22/2024 0837 Last data filed at 02/22/2024 0600 Gross per 24 hour  Intake 1871.25 ml  Output 1300 ml  Net 571.25 ml      02/20/2024    1:59 PM 02/04/2024   12:39 PM 01/20/2024   12:25 AM  Last 3 Weights  Weight (lbs) 118 lb 118 lb 123 lb 7.3 oz  Weight (kg) 53.524 kg 53.524 kg 56 kg      Telemetry/ECG   Atrial fibrillation with RVR, 120-150s - Personally Reviewed  Physical Exam  GEN: No acute distress.   Neck: No JVD Cardiac: Irreg Irreg, no murmurs, rubs, or gallops.  Respiratory: Expiratory wheezing GI: Soft, nontender, non-distended  MS: No edema  Assessment & Plan   53 y.o. 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 Gennaro.    Atrial fibrillation with RVR with episodic aberrancy History of WPW -- Initially started on procainamide  in the ED, this was stopped and transition to IV diltiazem along with Coreg  25 mg twice daily -- Unfortunately his options are limited as he is not anticoagulated in the setting of noncompliance as well as polysubstance abuse including  alcohol .  Therefore not a candidate for cardioversion or IV amiodarone  at this point. -- resumed on IV Diltiazem last evening, up 15mg /hr this morning. Rates remain elevated -- IV dig 0.25 x1 now, follow rates  -- continue coreg  25mg  BID  -- TSH 0.913, free T4 0.74   Polysubstance abuse -- History of cocaine abuse, EtOH use states he drinks about a sixpack per day -- Suspect this is admitting to his atrial fibrillation  Hypomag -- reports episodes of diarrhea overnight -- Mag + 1.2, suppl   Per Primary team Abdominal pain Chronic pancreatitis GERD BPH  For questions or updates, please contact [facility] Please consult [email or web address] for contact info under   Signed, Manuelita Rummer, NP

## 2024-02-22 NOTE — Assessment & Plan Note (Addendum)
 Acute on chronic. States abdominal pain is improved and would like to advance diet. Discussed with patient that if he feeling well enough to eat, we will discontinue oxycodone . He would prefer to keep oxycodone  so will continue with full liquid diet for now. Considered celiac plexus block but from chart review, GI attempted this Oct 2024 and were not able to safely complete the procedure 2/2 anatomic variances. - Creon  TID with meals - Multivitamin with minerals daily - Pepcid  40 mg daily - Protonix  40 mg daily - Sucralfate  1 g 3 times daily - GI cocktail - Pain regimen  - Try K-pad on back, lidoderm  patch to chest/abdomen - Tylenol  1000 mg q6hrs scheduled - Oxycodone  5 mg q4hrs PRN - GI consulted, appreciate recommendations - Pseudocyst too small to drain, no further interventions - Consider CTAP if pain acutely worsens

## 2024-02-22 NOTE — Assessment & Plan Note (Addendum)
 Patient found with multiple tobacco products in room. CIWA 0, will continue to monitor given h/o of EtOH withdrawal seizures, most recently last week. - Continue Phenobarbital taper - Continue Nicotine  patch; added nicotine  gum - Continue Thiamine  100 mg daily, Folic acid  1 mg daily, and Multivitamin with minerals daily - CIWA precautions - For elevated CIWA's contact our team

## 2024-02-22 NOTE — Assessment & Plan Note (Addendum)
 Currently not rate controlled. HR remains elevated (109-142 in last 12hrs) even with max dose Diltiazem drip. Soft BPs. - Cardiology consulted, appreciate recommendations - Goal HR < 115 - Increase IV Diltiazem to 20 mg/hr as long as SBP > 90 - IV Digoxin  0.25 mg x1 today - Continue Carvedilol  25mg  BID

## 2024-02-22 NOTE — Assessment & Plan Note (Addendum)
 Mg 1.2 - Repleted with IV Mag sulfate 4g x1 - AM BMP, Mg

## 2024-02-22 NOTE — Progress Notes (Signed)
 Smucker, MD notified that patient's upper abdominal pain is radiating to left ear now. EKG ordered.

## 2024-02-22 NOTE — Assessment & Plan Note (Signed)
 GERD - continue home Protonix  40mg  daily, sucralfate  1 g TID, and Pepcid  40mg  daily BPH - continue home tamsulosin  0.4 mg daily

## 2024-02-22 NOTE — Progress Notes (Signed)
 Daily Progress Note Intern Pager: [phone]  Patient name: [name] Medical record number: [id number] Date of birth: Oct 14, 1970 Age: 53 y.o. Gender: male  Primary Care Provider: Janit Salines Consultants: Cardiology Code Status: Full code  Pt Overview and Major Events to Date:  10/14 - Admitted  Assessment and Plan:  Sumedh Taft Worthing is a 53 y.o. male with a PMH of paroxysmal A-fib, WPW s/p ablation, chronic pancreatitis, polysubstance use disorder, seizures from alcohol  withdrawal, anxiety/depression, chronic bronchitis admitted for Afib with RVR 2/2 chronic EtOH use and dehydration and chronic pancreatitis. Patient is not a candidate for anticoagulation or anti-arrhythmic drug therapy d/t polysubstance use and non-compliance per cardiology. Not currently rate controlled. Assessment & Plan Atrial fibrillation with RVR (HCC) Currently not rate controlled. HR remains elevated (109-142 in last 12hrs) even with max dose Diltiazem drip. Soft BPs. - Cardiology consulted, appreciate recommendations - Goal HR < 115 - Increase IV Diltiazem to 20 mg/hr as long as SBP > 90 - IV Digoxin  0.25 mg x1 today - Continue Carvedilol  25mg  BID Abdominal pain Chronic pancreatitis (HCC) Acute on chronic. States abdominal pain is improved and would like to advance diet. Discussed with patient that if he feeling well enough to eat, we will discontinue oxycodone . He would prefer to keep oxycodone  so will continue with full liquid diet for now. Considered celiac plexus block but from chart review, GI attempted this Oct 2024 and were not able to safely complete the procedure 2/2 anatomic variances. - Creon  TID with meals - Multivitamin with minerals daily - Pepcid  40 mg daily - Protonix  40 mg daily - Sucralfate  1 g 3 times daily - GI cocktail - Pain regimen  - Try K-pad on back, lidoderm  patch to chest/abdomen - Tylenol  1000 mg q6hrs scheduled - Oxycodone  5 mg q4hrs PRN - GI  consulted, appreciate recommendations - Pseudocyst too small to drain, no further interventions - Consider CTAP if pain acutely worsens Substance use disorder Patient found with multiple tobacco products in room. CIWA 0, will continue to monitor given h/o of EtOH withdrawal seizures, most recently last week. - Continue Phenobarbital taper - Continue Nicotine  patch; added nicotine  gum - Continue Thiamine  100 mg daily, Folic acid  1 mg daily, and Multivitamin with minerals daily - CIWA precautions - For elevated CIWA's contact our team Hypomagnesemia Mg 1.2 - Repleted with IV Mag sulfate 4g x1 - AM BMP, Mg Chronic health problem GERD - continue home Protonix  40mg  daily, sucralfate  1 g TID, and Pepcid  40mg  daily BPH - continue home tamsulosin  0.4 mg daily  Anemia Hgb 11 > 8.7 with repeat at 9.4. Likely dilutional 2/2 IVF  FEN/GI: Full liquid PPx: Lovenox  Dispo:Home pending improvement in HR.   Subjective:  Overnight: Patient found with several tobacco products in his room. Also complaining of pain from the chest to the L ear. EKG showed Afib with RVR.  This morning, patient is resting in bed. Continues to endorse chest pain with radiation to the L ear similar to last night. Denies SOB. Reports LUQ pain is improved and wants to advance diet. Discussed polysubstance use and patient states he is interested in cessation because his health is more important.  Objective: Temp:  [97.6 F (36.4 C)-98.2 F (36.8 C)] 97.8 F (36.6 C) (10/16 0343) Pulse Rate:  [30-154] 105 (10/16 0500) Resp:  [12-35] 17 (10/16 0630) BP: (89-146)/(57-106) 93/61 (10/16 0630) SpO2:  [92 %-100 %] 99 % (10/16 0630)  Physical Exam: General: Alert, thin, in NAD Cardiovascular:  Irregular, tachycardic Pulmonary: Normal WOB. CTAB with no w/c/r present. Abdomen: Soft, non-tender, non-distended. Extremities: Warm and well-perfused, moves all four extremities appropriately  Laboratory: Most recent CBC Lab  Results  Component Value Date   WBC 9.2 02/22/2024   HGB 8.7 (L) 02/22/2024   HCT 26.3 (L) 02/22/2024   MCV 96.0 02/22/2024   PLT 127 (L) 02/22/2024   Most recent BMP    Latest Ref Rng & Units 02/22/2024    3:51 AM  BMP  Glucose 70 - 99 mg/dL 855   BUN 6 - 20 mg/dL <5   Creatinine 9.38 - 1.24 mg/dL 9.27   Sodium 864 - 854 mmol/L 136   Potassium 3.5 - 5.1 mmol/L 3.5   Chloride 98 - 111 mmol/L 111   CO2 22 - 32 mmol/L 16   Calcium  8.9 - 10.3 mg/dL 7.1     Other pertinent labs: Mg 1.2   Imaging/Diagnostic Tests: None in 24 hours  Jerrie Gathers, DO 02/22/2024, 8:01 AM  PGY-1, [facility] FPTS Intern pager: [phone], text pages welcome Secure chat group [facility] Teaching Service

## 2024-02-23 DIAGNOSIS — K860 Alcohol-induced chronic pancreatitis: Secondary | ICD-10-CM

## 2024-02-23 DIAGNOSIS — F1010 Alcohol abuse, uncomplicated: Secondary | ICD-10-CM | POA: Diagnosis not present

## 2024-02-23 DIAGNOSIS — K862 Cyst of pancreas: Secondary | ICD-10-CM | POA: Diagnosis not present

## 2024-02-23 DIAGNOSIS — I4891 Unspecified atrial fibrillation: Secondary | ICD-10-CM | POA: Diagnosis not present

## 2024-02-23 LAB — BASIC METABOLIC PANEL WITH GFR
Anion gap: 9 (ref 5–15)
BUN: <5 mg/dL — ABNORMAL LOW (ref 6–20)
CO2: 16 mmol/L — ABNORMAL LOW (ref 22–32)
Calcium: 7.7 mg/dL — ABNORMAL LOW (ref 8.9–10.3)
Chloride: 111 mmol/L (ref 98–111)
Creatinine, Ser: 0.64 mg/dL (ref 0.61–1.24)
GFR, Estimated: >60 mL/min (ref >60)
Glucose, Bld: 127 mg/dL — ABNORMAL HIGH (ref 70–99)
Potassium: 3 mmol/L — ABNORMAL LOW (ref 3.5–5.1)
Sodium: 136 mmol/L (ref 135–145)

## 2024-02-23 LAB — MAGNESIUM: Magnesium: 1.5 mg/dL — ABNORMAL LOW (ref 1.7–2.4)

## 2024-02-23 IMAGING — CR DG CHEST 2V
2 series · 2 of 2 positions shown · non-contrast
Comparison: 08/14/2021

CLINICAL DATA: Chest pain

EXAM:
CHEST - 2 VIEW

[chest pa]
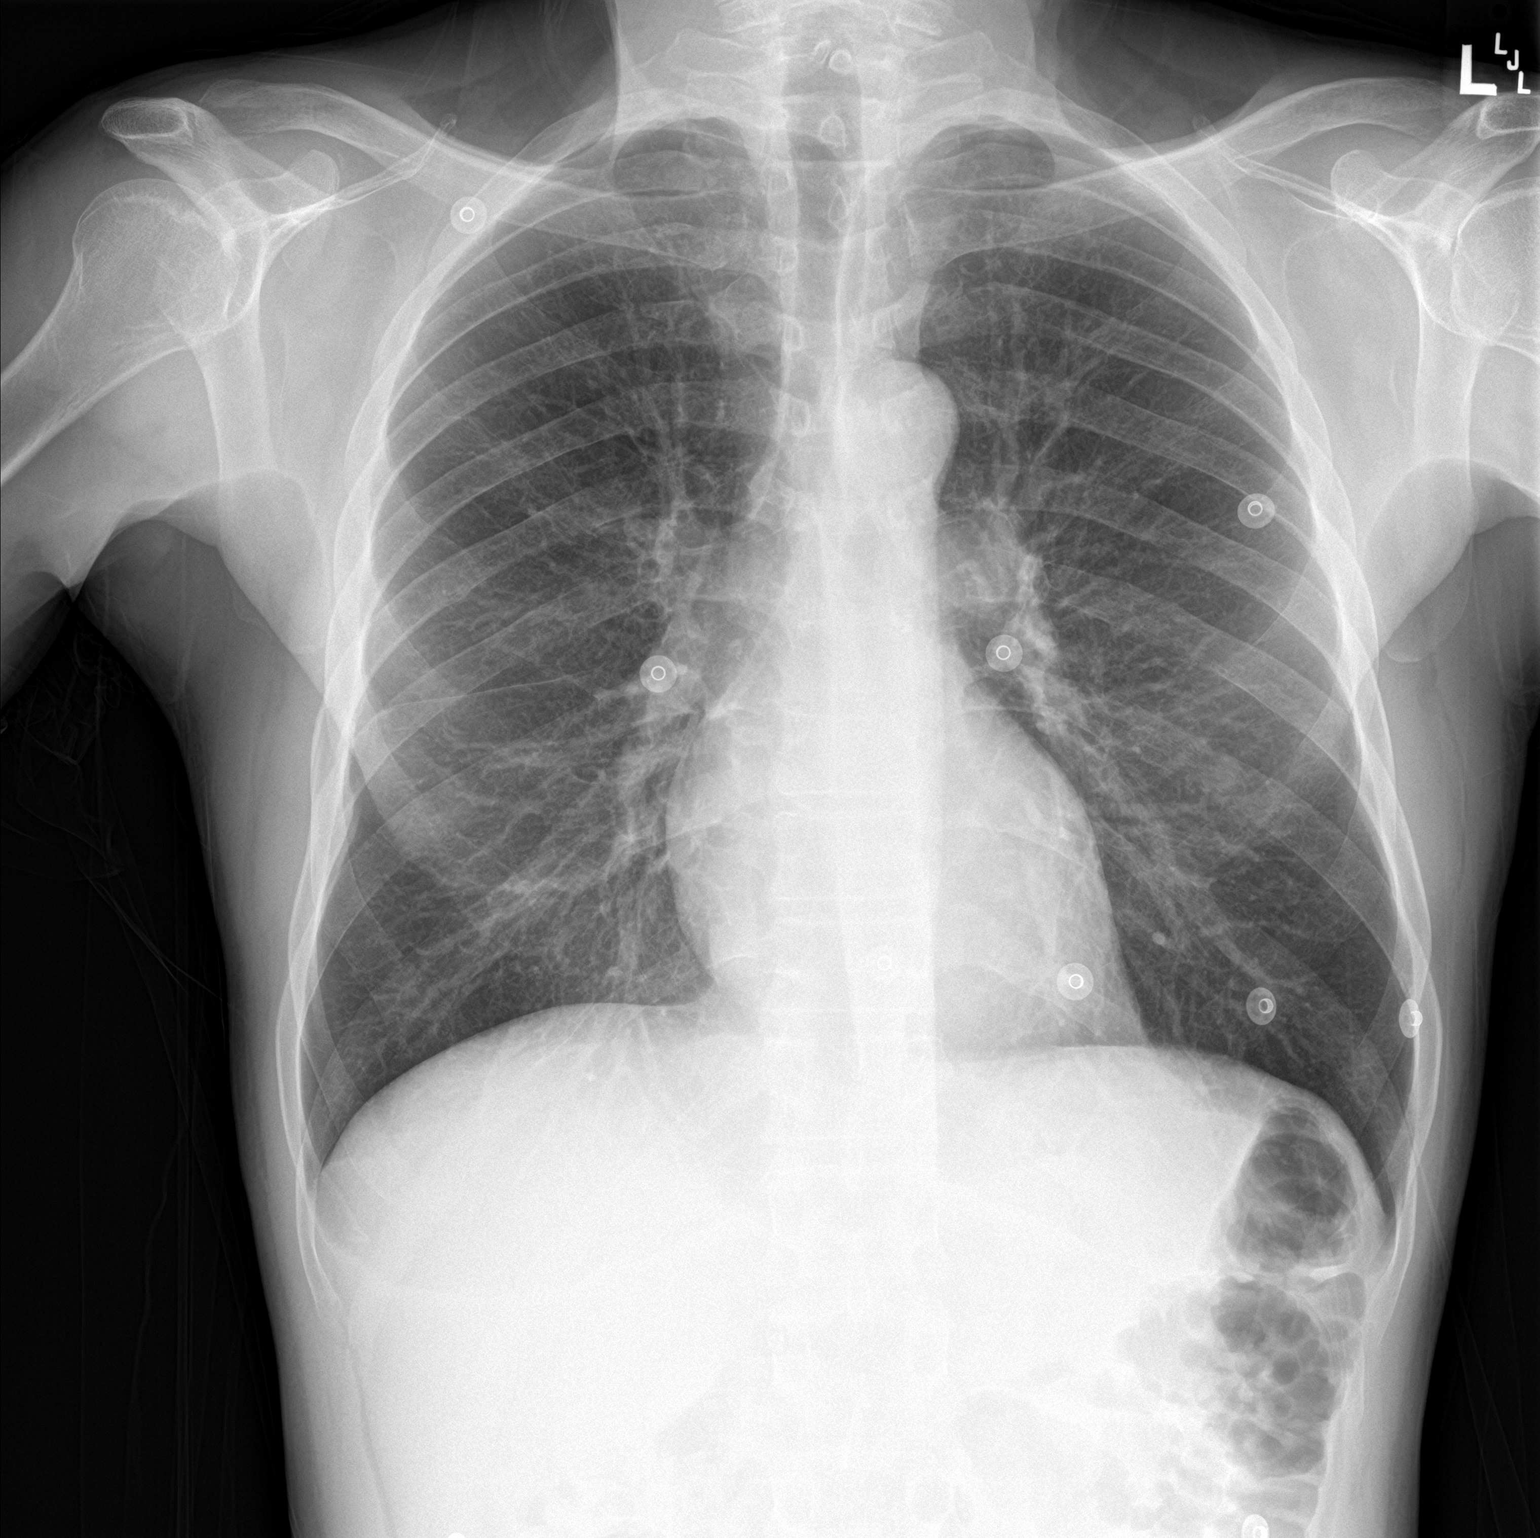

[chest lat]
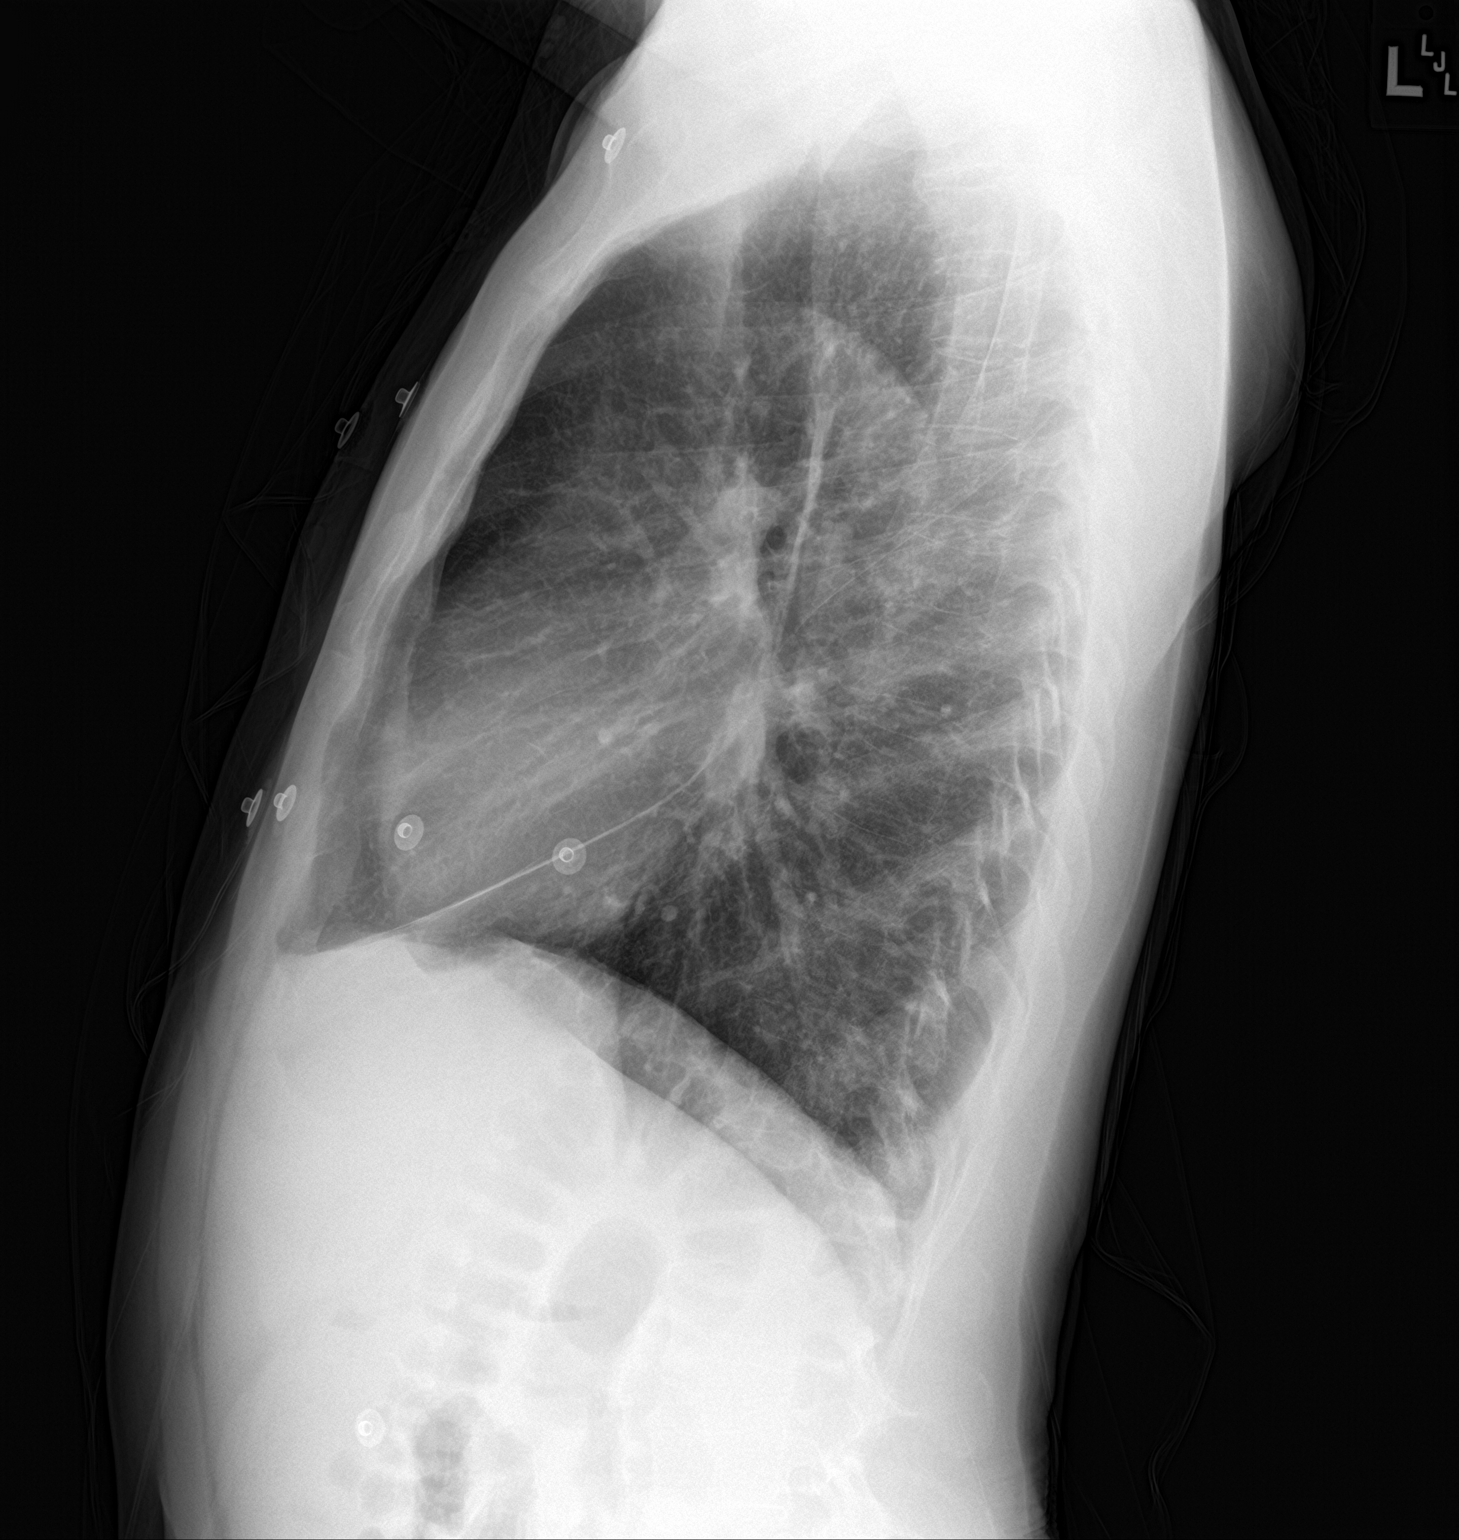

[2 of 2 positions shown; findings below may reference images not displayed]

FINDINGS: Resolution of previously noted small pleural effusions. No focal
opacity or pneumothorax. Normal cardiac size
IMPRESSION: No active cardiopulmonary disease.

## 2024-02-23 MED ORDER — LIDOCAINE 5 % EX PTCH
2.0000 | MEDICATED_PATCH | CUTANEOUS | Status: DC
  Administered 2024-02-24 – 2024-03-01 (×6): 2 via TRANSDERMAL
  Filled 2024-02-23 (×8): qty 2

## 2024-02-23 MED ORDER — OXYCODONE HCL 5 MG PO TABS
7.5000 mg | ORAL_TABLET | ORAL | Status: DC | PRN
Start: 2024-02-23 — End: 2024-02-27
  Administered 2024-02-23 – 2024-02-27 (×23): 7.5 mg via ORAL
  Filled 2024-02-23 (×23): qty 2

## 2024-02-23 MED ORDER — LOPERAMIDE HCL 2 MG PO CAPS
2.0000 mg | ORAL_CAPSULE | Freq: Once | ORAL | Status: AC
  Administered 2024-02-23: 2 mg via ORAL
  Filled 2024-02-23: qty 1

## 2024-02-23 MED ORDER — MAGNESIUM SULFATE IN D5W 1-5 GM/100ML-% IV SOLN
1.0000 g | Freq: Once | INTRAVENOUS | Status: AC
  Administered 2024-02-23: 1 g via INTRAVENOUS
  Filled 2024-02-23: qty 100

## 2024-02-23 MED ORDER — DILTIAZEM HCL 30 MG PO TABS
120.0000 mg | ORAL_TABLET | Freq: Four times a day (QID) | ORAL | Status: AC
  Administered 2024-02-23 – 2024-02-25 (×10): 120 mg via ORAL
  Filled 2024-02-23 (×12): qty 4

## 2024-02-23 MED ORDER — PSYLLIUM 95 % PO PACK
1.0000 | PACK | Freq: Every day | ORAL | Status: AC
  Filled 2024-02-23: qty 1

## 2024-02-23 MED ORDER — POTASSIUM CHLORIDE 20 MEQ PO PACK
60.0000 meq | PACK | Freq: Once | ORAL | Status: AC
  Administered 2024-02-23: 60 meq via ORAL
  Filled 2024-02-23: qty 3

## 2024-02-23 MED ORDER — PANCRELIPASE (LIP-PROT-AMYL) 36000-114000 UNITS PO CPEP: 36000.0000 [IU] | ORAL_CAPSULE | ORAL | Status: DC | PRN

## 2024-02-23 MED ORDER — PANCRELIPASE (LIP-PROT-AMYL) 36000-114000 UNITS PO CPEP
108000.0000 [IU] | ORAL_CAPSULE | Freq: Three times a day (TID) | ORAL | Status: DC
  Administered 2024-02-23 – 2024-03-01 (×21): 108000 [IU] via ORAL
  Filled 2024-02-23 (×25): qty 3

## 2024-02-23 NOTE — Consult Note (Addendum)
 Consultation Note   Referring Provider:  Family Medicine Teaching Service PCP: Myra Wanda LABOR, MD Primary Gastroenterologist:    Elspeth Naval, MD     Reason for Consultation: Abnormal stomach on imaging DOA: 02/20/2024         Hospital Day: 4   Attending physician's note  I personally saw the patient and performed a substantive portion of the medical decision making process for this encounter (including a complete performance of the key components : MDM, Hx and Exam).  I agree with the APP's note, impression, and  the management plan for the number and complexity of problems addressed at the encounter for the patient and take responsibility for that plan with its inherent risk of complications, morbidity, or mortality with additional input as follows.    53 year old male with chronic pancreatitis, chronic pain, ongoing alcohol  use presented with abdominal pain, A-fib with RVR  4 cm pancreatic body cyst, has been relatively stable since October 2024, he has had >12-15 imaging studies [MRI abdomen MRCP, CT abdomen and pelvis, CT angio abdomen pelvis, abdominal ultrasound) within past 12 months  He had EGD and EUS in October 2024 because of increase in size of the cyst from 2023 to 2024, it has been relatively stable in size since then  He has portal hypertensive gastropathy and hypoalbuminemia, likely etiology for gastric wall thickening and also under distention playing a role.    On exam abdomen is soft, no significant tenderness or rebound.  No distention  Discussed alcohol  cessation.  Patient has a court date on Wednesday and he would like to stay inpatient until then and then go to inpatient rehab for alcohol   Do not recommend repeat EGD, will be low yield, he had multiple scans with no significant abnormality in last EGD and EUS was within 1 year  Continue supportive care  GI signing off, please call with any questions    The patient was provided an opportunity to ask questions and all were answered. The patient agreed with the plan and demonstrated an understanding of the instructions.  LOIS Wilkie Mcgee , MD [phone]    ASSESSMENT    53 year old male with chronic pancreatitis (EtOH related) complicated by pseudocysts currently admitted with atrial fibrillation with RVR.  CT scan this admission showing a 4 cm pancreatic body cystic structure,  increased from 2.3 cm on prior studies from 2022 and 2023. unchanged compare with more recent studies. EUS with celiac plexus block last year unsuccessful due to anatomy.  EUS with celiac plexus block last year unsuccessful due to anatomy.   He takes Creon  with meals.   Patient is in the process of getting established with GI in [facility] as he has been dismissed from our practice.   Abnormal stomach on CT scan - diffuse gastric wall thickening on demonstrated on CT scans with contrast on 12/11/23 and 01/30/24 ( Care Everywhere).  Likely 2/2 to know portal hypertensive gastropathy seen on EGD October 2024  Etoh abuse Ongoing drinking   Low serum magnesium  Repletion in progress  See PMH for any additional medical history  / medical problems  Principal Problem:   Atrial fibrillation with RVR (HCC) Active Problems:   Substance use disorder   Chronic pancreatitis (  HCC)   Hypomagnesemia   Hypokalemia   Abdominal pain   Chronic health problem   PLAN:   --Reassured patient about gastric findings on CT scan.   --I counseled him on importance of abstinence from alcohol . He is trying to get into inpatient Rehab.  --I also counseled him on the importance of obtaining any controlled substances from only 1 provider.  Hopefully he will be establishing with Pain Management soon.   HPI   Patient admitted with atrial fibrillation with RVR.  He complained of pounding in his chest.  He has chronic abdominal pain as well.  In the ED he described recent onset of nausea,  vomiting, loose stool and worsening of his chronic abdominal pain after eating fried chicken.  He gives the same history to me.  He is compliant with Creon  with meals.  He also takes Carafate  for  ulcers.    Magnesium  low on admission.  White count normal.   Hemoglobin on admission was 11.5 (at baseline).  Albumin  2.9 , LFTs otherwise unremarkable labs have been reassuring   Pertinent GI Studies   October 2024 EGD and EUS EGD Impression: - Tortuous esophagus. Z-line irregular, 45 cm from the incisors. - 1 cm hiatal hernia. - Portal hypertensive gastropathy proximally. - Three non-bleeding angioectasias in the stomach. Treated with argon plasma coagulation (APC). - No gross lesions in the entire stomach. - Single duodenal angioectasia noted in D2. Treated with APC. - Congested duodenal mucosa. Biopsied. - Mucosal changes in the duodenum. EUS Impression: - A cystic lesion was seen in the pancreatic tail. Cytology results are pending. However, the endosonographic appearance is suspicious for a pancreatic pseudocyst. Fine needle aspiration for fluid performed with elevated CA19-9 level noted in bloodwork (has been elevated for years). - Pancreatic parenchymal abnormalities consisting of hyperechoic foci and lobularity with honeycombing were noted in the entire pancreas. - The pancreatic duct had a tortuous/ectatic appearance in the pancreatic head, genu of the pancreas, body of the pancreas and tail of the pancreas. - Patient has chronic pancreatitis. - Hyperechoic material consistent with sludge was visualized endosonographically in the gallbladder. - There was no sign of significant pathology in the common bile duct and in the common hepatic duct (other than prominence proximally). - No malignant-appearing lymph nodes were visualized in the celiac region (level 20), peripancreatic region and porta hepatis region. - Due to variance in anatomy from the Celiac trunk, safe endoscopic Celiac block cannot be  pursued.  FINAL MICROSCOPIC DIAGNOSIS:   A. DUODENUM, BIOPSY:  Duodenal mucosa with intact villous architecture.  Negative for villous atrophy or increased intraepithelial lymphocytes.  Negative for dysplasia or malignancy.     FINAL MICROSCOPIC DIAGNOSIS:  - No malignant cells identified  - Findings consistent: With contents of a benign cyst    Labs and Imaging:  Recent Labs    02/21/24 0239 02/22/24 0351  PROT 6.4* 5.5*  ALBUMIN  3.3* 2.9*  AST 57* 30  ALT 28 23  ALKPHOS 82 65  BILITOT 0.9 0.4   Recent Labs    02/21/24 0239 02/22/24 0351 02/22/24 0959  WBC 7.2 9.2 8.8  HGB 11.0* 8.7* 9.4*  HCT 35.6* 26.3* 29.0*  MCV 101.1* 96.0 97.3  PLT 128* 127* 129*   Recent Labs    02/21/24 0239 02/22/24 0351 02/23/24 0337  NA 130* 136 136  K 4.6 3.5 3.0*  CL 107 111 111  CO2 12* 16* 16*  GLUCOSE 154* 144* 127*  BUN 6 <5* <5*  CREATININE  0.81 0.72 0.64  CALCIUM  7.1* 7.1* 7.7*     CT Angio Chest Pulmonary Embolism (PE) W or WO Contrast EXAM: CTA CHEST 02/21/2024 06:08:16 AM  TECHNIQUE: CTA of the chest was performed without and with the administration of 75 mL of iohexol  (OMNIPAQUE ) 350 MG/ML intravenous contrast. Multiplanar reformatted images are provided for review. MIP images are provided for review. Automated exposure control, iterative reconstruction, and/or weight based adjustment of the mA/kV was utilized to reduce the radiation dose to as low as reasonably achievable.  COMPARISON: CTA chest, abdomen, and pelvis 11/21/2023, CTA chest, abdomen, and pelvis 12/02/2023.  CLINICAL HISTORY: Persistent tachycardia and chest pain concerned for PE vs dissection.  FINDINGS:  PULMONARY ARTERIES: Pulmonary arteries are adequately opacified for evaluation. No acute pulmonary embolus. Main pulmonary artery is normal in caliber. The pulmonary veins are nondistended.  MEDIASTINUM: The cardiac size is normal with a small anterior pericardial effusion  again shown. There is no visible coronary calcification.  There is minimal scattered aortic calcific plaque. The great vessels and the aorta demonstrate no aneurysm, stenosis, or dissection.  LYMPH NODES: No mediastinal, hilar or axillary lymphadenopathy.  LUNGS AND PLEURA: There are centrilobular and paraseptal emphysematous changes in the upper lobes, right apical scarring.  Diffuse bronchial thickening. There is posterior atelectasis in the lower lobes. There is no consolidation, no visible nodules. No pleural effusion or pneumothorax.  UPPER ABDOMEN: There are pancreatic parenchymal calcifications compatible with chronic calcific pancreatitis.  There is a 4 cm cyst in the body of the pancreas, seen previously, hounsfield density is 14, with prior studies indicating this to be a likely pseudocyst. For reference, on scans from 2022 and 2023, this measured 2.3 cm.  SOFT TISSUES AND BONES: No acute bone or soft tissue abnormality.  IMPRESSION: 1. No evidence of pulmonary embolism or aortic dissection. Mild aortic atherosclerosis. 2. Small anterior pericardial effusion. 3. Pancreatic parenchymal calcifications compatible with chronic calcific pancreatitis and a 4 cm pancreatic body cystic structure, likely a pseudocyst, increased from 2.3 cm on prior studies from 2022 and 2023. unchanged compared with more recent studies.  Electronically signed by: Francis Quam MD 02/21/2024 06:54 AM EDT RP Workstation: [id number]     Past Medical History:  Diagnosis Date   Alcoholism /alcohol  abuse    Anemia    Anxiety    Arthritis    knees; arms; elbows (03/26/2015)   Asthma    Bipolar disorder (HCC)    Chronic bronchitis (HCC)    Chronic lower back pain    Chronic pancreatitis (HCC)    Cocaine abuse (HCC)    Depression    Family history of adverse reaction to anesthesia    Femoral condyle fracture (HCC) 03/08/2014   left medial/notes 03/09/2014   GERD (gastroesophageal  reflux disease)    H/O hiatal hernia    H/O suicide attempt 10/2012   High cholesterol    History of blood transfusion 10/2012   when I tried to commit suicide   History of stomach ulcers    Hypertension    Marijuana abuse, continuous    Migraine    a few times/year (03/26/2015)   Pancreatitis    Pneumonia 1990's X 3   PTSD (post-traumatic stress disorder)    Seizures (HCC)    Sickle cell trait    WPW (Wolff-Parkinson-White syndrome)    thelbert 03/06/2013    Past Surgical History:  Procedure Laterality Date   BIOPSY  11/25/2017   Procedure: BIOPSY;  Surgeon: Burnette Fallow, MD;  Location: [facility]  ENDOSCOPY;  Service: Endoscopy;;   BIOPSY  10/14/2018   Procedure: BIOPSY;  Surgeon: Burnette Fallow, MD;  Location: [facility] ENDOSCOPY;  Service: Endoscopy;;   BIOPSY  03/06/2023   Procedure: BIOPSY;  Surgeon: Wilhelmenia Aloha Raddle., MD;  Location: [facility] ENDOSCOPY;  Service: Gastroenterology;;   CARDIAC CATHETERIZATION     CYST ENTEROSTOMY  01/02/2020   Procedure: CYST ASPIRATION;  Surgeon: Teressa Toribio SQUIBB, MD;  Location: [facility] ENDOSCOPY;  Service: Endoscopy;;   ESOPHAGOGASTRODUODENOSCOPY N/A 03/06/2023   Procedure: ESOPHAGOGASTRODUODENOSCOPY (EGD);  Surgeon: Wilhelmenia Aloha Raddle., MD;  Location: [facility] ENDOSCOPY;  Service: Gastroenterology;  Laterality: N/A;   ESOPHAGOGASTRODUODENOSCOPY (EGD) WITH PROPOFOL  N/A 11/25/2017   Procedure: ESOPHAGOGASTRODUODENOSCOPY (EGD) WITH PROPOFOL ;  Surgeon: Burnette Fallow, MD;  Location: [facility] ENDOSCOPY;  Service: Endoscopy;  Laterality: N/A;   ESOPHAGOGASTRODUODENOSCOPY (EGD) WITH PROPOFOL  Left 10/14/2018   Procedure: ESOPHAGOGASTRODUODENOSCOPY (EGD) WITH PROPOFOL ;  Surgeon: Burnette Fallow, MD;  Location: [facility] ENDOSCOPY;  Service: Endoscopy;  Laterality: Left;   ESOPHAGOGASTRODUODENOSCOPY (EGD) WITH PROPOFOL  N/A 11/14/2018   Procedure: ESOPHAGOGASTRODUODENOSCOPY (EGD) WITH PROPOFOL ;  Surgeon: Celestia Agent, MD;  Location: [facility] ENDOSCOPY;  Service: Gastroenterology;  Laterality:  N/A;   ESOPHAGOGASTRODUODENOSCOPY (EGD) WITH PROPOFOL  N/A 01/02/2020   Procedure: ESOPHAGOGASTRODUODENOSCOPY (EGD) WITH PROPOFOL ;  Surgeon: Teressa Toribio SQUIBB, MD;  Location: [facility] ENDOSCOPY;  Service: Endoscopy;  Laterality: N/A;   ESOPHAGOGASTRODUODENOSCOPY (EGD) WITH PROPOFOL  N/A 10/25/2020   Procedure: ESOPHAGOGASTRODUODENOSCOPY (EGD) WITH PROPOFOL ;  Surgeon: Wilhelmenia Aloha Raddle., MD;  Location: [facility] ENDOSCOPY;  Service: Gastroenterology;  Laterality: N/A;   EUS N/A 01/02/2020   Procedure: UPPER ENDOSCOPIC ULTRASOUND (EUS) RADIAL;  Surgeon: Teressa Toribio SQUIBB, MD;  Location: [facility] ENDOSCOPY;  Service: Endoscopy;  Laterality: N/A;   EUS N/A 03/06/2023   Procedure: UPPER ENDOSCOPIC ULTRASOUND (EUS) RADIAL;  Surgeon: Wilhelmenia Aloha Raddle., MD;  Location: [facility] ENDOSCOPY;  Service: Gastroenterology;  Laterality: N/A;   EYE SURGERY Left 1990's   result of trauma    FACIAL FRACTURE SURGERY Left 1990's   result of trauma    FINE NEEDLE ASPIRATION N/A 03/06/2023   Procedure: FINE NEEDLE ASPIRATION (FNA) LINEAR;  Surgeon: Wilhelmenia Aloha Raddle., MD;  Location: [facility] ENDOSCOPY;  Service: Gastroenterology;  Laterality: N/A;   FLEXIBLE SIGMOIDOSCOPY N/A 10/25/2020   Procedure: FLEXIBLE SIGMOIDOSCOPY;  Surgeon: Wilhelmenia Aloha Raddle., MD;  Location: [facility] ENDOSCOPY;  Service: Gastroenterology;  Laterality: N/A;   FRACTURE SURGERY     HEMOSTASIS CLIP PLACEMENT  10/25/2020   Procedure: HEMOSTASIS CLIP PLACEMENT;  Surgeon: Wilhelmenia Aloha Raddle., MD;  Location: [facility] ENDOSCOPY;  Service: Gastroenterology;;   HERNIA REPAIR     HOT HEMOSTASIS N/A 03/06/2023   Procedure: HOT HEMOSTASIS (ARGON PLASMA COAGULATION/BICAP);  Surgeon: Wilhelmenia Aloha Raddle., MD;  Location: [facility] ENDOSCOPY;  Service: Gastroenterology;  Laterality: N/A;   LEFT HEART CATHETERIZATION WITH CORONARY ANGIOGRAM Right 03/07/2013   Procedure: LEFT HEART CATHETERIZATION WITH CORONARY ANGIOGRAM;  Surgeon: Salena GORMAN Negri, MD;  Location: [facility] CATH LAB;  Service:  Cardiovascular;  Laterality: Right;   UMBILICAL HERNIA REPAIR     UPPER GASTROINTESTINAL ENDOSCOPY      Family History  Problem Relation Age of Onset   Hypertension Mother    Cirrhosis Father    Alcoholism Father    Hypertension Father    Melanoma Father    Hypertension Other    Coronary artery disease Other     Prior to Admission medications   Medication Sig Start Date End Date Taking? Authorizing Provider  acetaminophen  (TYLENOL ) 500 MG tablet Take 1,000 mg by mouth daily as needed for mild pain (pain  score 1-3) or moderate pain (pain score 4-6).   Yes [provider]  carvedilol  (COREG ) 25 MG tablet Take 1 tablet (25 mg total) by mouth 2 (two) times daily with a meal. 12/08/23  Yes West, Katlyn D, NP  famotidine  (PEPCID ) 40 MG tablet Take 1 tablet (40 mg total) by mouth daily. Patient taking differently: Take 40 mg by mouth 2 (two) times daily. 10/17/23  Yes Henderly, Britni A, PA-C  lidocaine  (XYLOCAINE ) 2 % solution Use as directed 15 mLs in the mouth or throat every 6 (six) hours as needed for mouth pain. 02/04/24  Yes Donnajean Lynwood DEL, PA-C  lipase/protease/amylase (CREON ) 36000 UNITS CPEP capsule Take 2 capsules (72,000 Units total) by mouth 3 (three) times daily with meals. May also take 1 capsule (36,000 Units total) as needed (with snacks). 08/21/23  Yes Ghimire, Donalda HERO, MD  loperamide  (IMODIUM  A-D) 2 MG tablet Take 2 mg by mouth 4 (four) times daily as needed for diarrhea or loose stools.   Yes [provider]  magnesium  oxide (MAG-OX) 400 (240 Mg) MG tablet Take 1 tablet (400 mg total) by mouth daily. 11/07/23  Yes Hoy Fraction F, PA-C  Multiple Vitamin (MULTIVITAMIN WITH MINERALS) TABS tablet Take 1 tablet by mouth daily. Patient taking differently: Take 2 tablets by mouth daily. 08/21/23  Yes Ghimire, Donalda HERO, MD  nicotine  (NICODERM CQ  - DOSED IN MG/24 HOURS) 21 mg/24hr patch Place 1 patch (21 mg total) onto the skin daily. Patient taking  differently: Place 21 mg onto the skin daily as needed (for smoking cessation). 11/14/23  Yes Amin, Ankit C, MD  ondansetron  (ZOFRAN -ODT) 4 MG disintegrating tablet Take 1 tablet (4 mg total) by mouth every 8 (eight) hours as needed. 11/23/23  Yes Vernon Ranks, MD  Oxycodone  HCl 10 MG TABS Take 10 mg by mouth 3 (three) times daily as needed (for pain). 02/13/24  Yes [provider]  pantoprazole  (PROTONIX ) 40 MG tablet Take 1 tablet (40 mg total) by mouth daily. 01/18/24  Yes Kehrli, Kelsey F, PA-C  polyethylene glycol powder (GLYCOLAX /MIRALAX ) 17 GM/SCOOP powder Take 17 g by mouth daily as needed for mild constipation. 11/13/23  Yes Amin, Ankit C, MD  sucralfate  (CARAFATE ) 1 GM/10ML suspension Take 1 g by mouth 4 (four) times daily -  with meals and at bedtime.   Yes [provider]  oxyCODONE -acetaminophen  (PERCOCET) 5-325 MG tablet Take 1 tablet by mouth every 6 (six) hours as needed for severe pain (pain score 7-10). Patient not taking: Reported on 02/20/2024 02/04/24   Donnajean Lynwood DEL, PA-C  sucralfate  (CARAFATE ) 1 g tablet TAKE 1 TABLET BY MOUTH EVERY MORNING, AT NOON AND AT BEDTIME Patient not taking: Reported on 02/20/2024 02/02/24   Theotis Haze ORN, NP  amitriptyline  (ELAVIL ) 25 MG tablet Take 1 tablet (25 mg total) by mouth at bedtime. Patient not taking: Reported on 08/08/2019 10/15/18 08/08/19  Tobie Yetta HERO, MD    Current Facility-Administered Medications  Medication Dose Route Frequency Provider Last Rate Last Admin   acetaminophen  (TYLENOL ) tablet 1,000 mg  1,000 mg Oral Q6H Alena Morrison, Reagan, MD   1,000 mg at 02/23/24 9095   carvedilol  (COREG ) tablet 25 mg  25 mg Oral BID WC Theophilus Pagan, MD   25 mg at 02/23/24 0841   diltiazem (CARDIZEM) 125 mg in dextrose  5% 125 mL (1 mg/mL) infusion  20 mg/hr Intravenous Titrated [place], Peter M, MD 20 mL/hr at 02/23/24 0837 20 mg/hr at 02/23/24 0837   diltiazem (CARDIZEM)  tablet 120 mg  120 mg Oral Q6H [place], Peter M, MD    120 mg at 02/23/24 1320   enoxaparin  (LOVENOX ) injection 40 mg  40 mg Subcutaneous Q24H Theophilus Pagan, MD   40 mg at 02/22/24 1725   famotidine  (PEPCID ) tablet 40 mg  40 mg Oral Daily Theophilus Pagan, MD   40 mg at 02/23/24 9161   folic acid  (FOLVITE ) tablet 1 mg  1 mg Oral Daily Theophilus Pagan, MD   1 mg at 02/23/24 0838   [START ON 02/24/2024] lidocaine  (LIDODERM ) 5 % 2 patch  2 patch Transdermal Q24H Baker, Amelia G, DO       lipase/protease/amylase (CREON ) capsule 108,000 Units  108,000 Units Oral TID WC Donah Laymon PARAS, MD       And   lipase/protease/amylase (CREON ) capsule 36,000 Units  36,000 Units Oral PRN McIntyre, Brittany J, MD       melatonin tablet 3 mg  3 mg Oral QHS PRN Alena Morrison, Reagan, MD   3 mg at 02/22/24 2159   multivitamin with minerals tablet 1 tablet  1 tablet Oral Daily Theophilus Pagan, MD   1 tablet at 02/23/24 9160   nicotine  (NICODERM CQ  - dosed in mg/24 hours) patch 21 mg  21 mg Transdermal Daily Delores Suzann HERO, MD   21 mg at 02/23/24 9090   nicotine  polacrilex (NICORETTE ) gum 2 mg  2 mg Oral PRN Miller, Samantha, DO       oxyCODONE  (Oxy IR/ROXICODONE ) immediate release tablet 7.5 mg  7.5 mg Oral Q4H PRN Baker, Amelia G, DO   7.5 mg at 02/23/24 1319   pantoprazole  (PROTONIX ) EC tablet 40 mg  40 mg Oral Daily Theophilus Pagan, MD   40 mg at 02/23/24 0840   PHENobarbital (LUMINAL) tablet 64.8 mg  64.8 mg Oral Q8H Theophilus Pagan, MD   64.8 mg at 02/23/24 9162   Followed by   NOREEN ON 02/24/2024] PHENobarbital (LUMINAL) tablet 32.4 mg  32.4 mg Oral Q8H Theophilus Pagan, MD       psyllium (HYDROCIL/METAMUCIL) 1 packet  1 packet Oral Daily Alena Morrison, Reagan, MD       sucralfate  (CARAFATE ) tablet 1 g  1 g Oral TID WC & HS Theophilus Pagan, MD   1 g at 02/23/24 1322   tamsulosin  (FLOMAX ) capsule 0.4 mg  0.4 mg Oral Daily Theophilus Pagan, MD   0.4 mg at 02/23/24 9161   thiamine  (VITAMIN B1) tablet 100 mg  100 mg Oral Daily Theophilus Pagan, MD    100 mg at 02/23/24 9162   Or   thiamine  (VITAMIN B1) injection 100 mg  100 mg Intravenous Daily Theophilus Pagan, MD        Allergies as of 02/20/2024 - Review Complete 02/20/2024  Allergen Reaction Noted   Robaxin  [methocarbamol ] Other (See Comments) 07/20/2016   Bayer aspirin  [aspirin ] Other (See Comments) 07/31/2018   Trazodone  and nefazodone Other (See Comments) 09/28/2011   Adhesive [tape] Itching 03/17/2018   Latex Itching 03/17/2018   Toradol  [ketorolac  tromethamine ] Other (See Comments) 03/17/2018   Contrast media [iodinated contrast media] Hives 12/04/2016   Fish allergy Nausea And Vomiting and Rash    Reglan  [metoclopramide ] Other (See Comments) 11/18/2015   Garnell gums oil] Nausea And Vomiting and Rash 08/15/2021   Shellfish allergy Nausea And Vomiting and Rash     Social History   Socioeconomic History   Marital status: Single    Spouse name: Not on file   Number of children: Not on file  Years of education: Not on file   Highest education level: Not on file  Occupational History   Occupation: disabled  Tobacco Use   Smoking status: Every Day    Current packs/day: 1.00    Average packs/day: 1 pack/day for 36.0 years (36.0 ttl pk-yrs)    Types: Cigarettes   Smokeless tobacco: Never  Vaping Use   Vaping status: Every Day  Substance and Sexual Activity   Alcohol  use: Yes    Comment: 01/07/24 4oz beer   Drug use: Yes    Types: Marijuana, Cocaine    Comment: Last used cocaine 01/04/24   Sexual activity: Yes    Birth control/protection: None  Other Topics Concern   Not on file  Social History Narrative   ** Merged History Encounter **       Social Drivers of Corporate investment banker Strain: Not on file  Food Insecurity: No Food Insecurity (02/21/2024)   Hunger Vital Sign    Worried About Running Out of Food in the Last Year: Never true    Ran Out of Food in the Last Year: Never true  Transportation Needs: No Transportation Needs (02/21/2024)    PRAPARE - Administrator, Civil Service (Medical): No    Lack of Transportation (Non-Medical): No  Recent Concern: Transportation Needs - Unmet Transportation Needs (11/23/2023)   PRAPARE - Administrator, Civil Service (Medical): Yes    Lack of Transportation (Non-Medical): No  Physical Activity: Not on file  Stress: Not on file  Social Connections: Socially Isolated (11/10/2023)   Social Connection and Isolation Panel    Frequency of Communication with Friends and Family: Three times a week    Frequency of Social Gatherings with Friends and Family: Once a week    Attends Religious Services: Never    Database administrator or Organizations: No    Attends Banker Meetings: Never    Marital Status: Never married  Intimate Partner Violence: Not At Risk (02/21/2024)   Humiliation, Afraid, Rape, and Kick questionnaire    Fear of Current or Ex-Partner: No    Emotionally Abused: No    Physically Abused: No    Sexually Abused: No     Code Status   Code Status: Full Code  Review of Systems: All systems reviewed and negative except where noted in HPI.  Physical Exam: Vital signs in last 24 hours: Temp:  [97.4 F (36.3 C)-98 F (36.7 C)] 97.4 F (36.3 C) (10/17 1249) Pulse Rate:  [88-114] 88 (10/17 1249) Resp:  [12-20] 20 (10/17 1249) BP: (99-121)/(65-90) 99/65 (10/17 1249) SpO2:  [95 %-100 %] 99 % (10/17 1249) Last BM Date : 02/23/24  General:  Pleasant thinmale in NAD Psych:  Cooperative. Normal mood and affect Eyes: Pupils equal Ears:  Normal auditory acuity Nose: No deformity, discharge or lesions Neck:  Supple, no masses felt Lungs:  Clear to auscultation.  Heart:  Regular rate, regular rhythm.  Abdomen:  Soft, nondistended, nontender, active bowel sounds, no masses felt Rectal :  Deferred Msk: Symmetrical without gross deformities.  Neurologic:  Alert, oriented, grossly normal neurologically Extremities : No edema Skin:  Intact  without significant lesions.    Intake/Output from previous day: 10/16 0701 - 10/17 0700 In: 2610.7 [P.O.:2151; I.V.:459.7] Out: 2500 [Urine:2500] Intake/Output this shift:  No intake/output data recorded.   Vina Dasen, NP-C   02/23/2024, 2:43 PM

## 2024-02-23 NOTE — Assessment & Plan Note (Addendum)
 Acute on chronic. Increased N/V/D since last night. Patient completed a 7 day course of PCN starting on 9/11, low risk of C.diff with PCN. Patient also with history of gastric wall thickening consistent with inflammation or neoplasm found on previous CT. Will discuss with patient if he is agreeable to MRI endoscopy while inpatient. - Increase Creon  to 108,000 units TID with meals and PRN with snacks - Multivitamin with minerals daily - Continue Pepcid  40 mg daily - Continue Protonix  40 mg daily - Continue Sucralfate  1 g 3 times daily - GI cocktail - Pain regimen  - Try K-pad on back, lidoderm  patch to chest/abdomen - Continue Tylenol  1000 mg q6hrs scheduled - Increase Oxycodone  to 7.5mg  q4hrs PRN - GI consulted, appreciate recommendations - Pseudocyst too small to drain, no further interventions - Consider CTAP if pain acutely worsens - Immodium 2mg  x1 for diarrhea - AM BMP, Mg, CBC

## 2024-02-23 NOTE — Assessment & Plan Note (Signed)
 GERD - continue home Protonix  40mg  daily, sucralfate  1 g TID, and Pepcid  40mg  daily BPH - continue home tamsulosin  0.4 mg daily

## 2024-02-23 NOTE — Progress Notes (Signed)
  Progress Note  Patient Name: [name] Date of Encounter: 02/23/2024 [facility] Cardiologist: Lynwood Schilling, MD   Interval Summary    Denies any chest pain or SOB. Tells me that he doesn't want to take pills when he goes home. Wants to have ablation done. Is asking to be transferred to [facility] hospital  Vital Signs Vitals:   02/23/24 0400 02/23/24 0509 02/23/24 0600 02/23/24 0756  BP:  105/81 115/84 121/89  Pulse:    (!) 108  Resp:  16 16 20   Temp: 98 F (36.7 C) 98 F (36.7 C)  (!) 97.4 F (36.3 C)  TempSrc: Oral Oral  Oral  SpO2: 100% 99% 95% 100%  Weight:      Height:        Intake/Output Summary (Last 24 hours) at 02/23/2024 1005 Last data filed at 02/23/2024 0600 Gross per 24 hour  Intake 2610.73 ml  Output 2500 ml  Net 110.73 ml      02/20/2024    1:59 PM 02/04/2024   12:39 PM 01/20/2024   12:25 AM  Last 3 Weights  Weight (lbs) 118 lb 118 lb 123 lb 7.3 oz  Weight (kg) 53.524 kg 53.524 kg 56 kg      Telemetry/ECG   Atrial fibrillation with RVR, 100-115 Personally Reviewed  Physical Exam  GEN: No acute distress.   Neck: No JVD Cardiac: Irreg Irreg, no murmurs, rubs, or gallops.  Respiratory: Expiratory wheezing GI: Soft, nontender, non-distended  MS: No edema  Assessment & Plan   53 y.o. 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 Gennaro.    Atrial fibrillation with RVR with episodic aberrancy History of WPW s/p ablation -- Unfortunately his options are limited as he is not anticoagulated in the setting of noncompliance as well as polysubstance abuse including alcohol .  Therefore not a candidate for  cardioversion or IV amiodarone  at this point. -- resumed on IV Diltiazem now at 20 mg/hr with good HR control and stable BP -- continue coreg  25mg  BID  -- will start oral diltiazem 120 mg every 6 hours and wean IV diltiazem.  -- he is not a candidate for ablation. -- explained the only way he would be considered for ablation would be if he can maintain sobriety and keep regular follow up appointments. This appears unlikely   Polysubstance abuse -- History of cocaine abuse, EtOH use states he drinks about a sixpack per day -- Suspect this is admitting to his atrial fibrillation  Hypomag -- reports episodes of diarrhea overnight -- Mag + 1.2, suppl   Per Primary team Abdominal pain Chronic pancreatitis GERD BPH  For questions or updates, please contact [facility] Please consult [email or web address] for contact info under   Signed, Lelaina Oatis [place], MD

## 2024-02-23 NOTE — Assessment & Plan Note (Addendum)
 Mg 1.5, K 3.0 - Repleted with Mag and K - AM BMP, Mg

## 2024-02-23 NOTE — Assessment & Plan Note (Addendum)
 Stable, CIWA 1, 7 in last 24 hrs. - Continue Phenobarbital taper - Continue Nicotine  patch and nicotine  gum - Continue Thiamine  100 mg daily, Folic acid  1 mg daily, and Multivitamin with minerals daily - CIWA precautions - For elevated CIWA's contact our team

## 2024-02-23 NOTE — Assessment & Plan Note (Signed)
 Mg 1.5, K 3.0 - Repleted with Mag and K - AM BMP, Mg

## 2024-02-23 NOTE — Plan of Care (Signed)

## 2024-02-23 NOTE — Plan of Care (Signed)
°  Problem: Education: °Goal: Knowledge of General Education information will improve °Description: Including pain rating scale, medication(s)/side effects and non-pharmacologic comfort measures °Outcome: Progressing °  °Problem: Health Behavior/Discharge Planning: °Goal: Ability to manage health-related needs will improve °Outcome: Progressing °  °Problem: Clinical Measurements: °Goal: Ability to maintain clinical measurements within normal limits will improve °Outcome: Progressing °  °Problem: Education: °Goal: Knowledge of General Education information will improve °Description: Including pain rating scale, medication(s)/side effects and non-pharmacologic comfort measures °Outcome: Progressing °  °Problem: Health Behavior/Discharge Planning: °Goal: Ability to manage health-related needs will improve °Outcome: Progressing °  °Problem: Clinical Measurements: °Goal: Ability to maintain clinical measurements within normal limits will improve °Outcome: Progressing °  °

## 2024-02-23 NOTE — Progress Notes (Signed)
 Daily Progress Note Intern Pager: [phone]  Patient name: [name] Medical record number: [id number] Date of birth: Oct 17, 1970 Age: 53 y.o. Gender: male  Primary Care Provider: Janit Moran Consultants: Cardiology Code Status: Full code  Pt Overview and Major Events to Date:  10/14 - Admitted  Assessment and Plan:  [name] is a 53 y.o. male with a PMH of paroxysmal A-fib, WPW s/p ablation, chronic pancreatitis, polysubstance use disorder, seizures from alcohol  withdrawal, anxiety/depression, chronic bronchitis admitted for Afib with RVR 2/2 chronic EtOH use and dehydration and chronic pancreatitis. HR is improving. Assessment & Plan Atrial fibrillation with RVR (HCC) HR at goal. - Cardiology consulted, appreciate recommendations - Goal HR < 115 - Wean IV diltiazem and start Diltiazem 120 mg PO - Continue Carvedilol  25mg  BID Abdominal pain Chronic pancreatitis (HCC) Acute on chronic. Increased N/V/D since last night. Patient completed a 7 day course of PCN starting on 9/11, low risk of C.diff with PCN. Patient also with history of gastric wall thickening consistent with inflammation or neoplasm found on previous CT. Will discuss with patient if he is agreeable to MRI endoscopy while inpatient. - Increase Creon  to 108,000 units TID with meals and PRN with snacks - Multivitamin with minerals daily - Continue Pepcid  40 mg daily - Continue Protonix  40 mg daily - Continue Sucralfate  1 g 3 times daily - GI cocktail - Pain regimen  - Try K-pad on back, lidoderm  patch to chest/abdomen - Continue Tylenol  1000 mg q6hrs scheduled - Increase Oxycodone  to 7.5mg  q4hrs PRN - GI consulted, appreciate recommendations - Pseudocyst too small to drain, no further interventions - Consider CTAP if pain acutely worsens - Immodium 2mg  x1 for diarrhea - AM BMP, Mg, CBC Substance use disorder Stable, CIWA 1, 7 in last 24 hrs. - Continue Phenobarbital taper -  Continue Nicotine  patch and nicotine  gum - Continue Thiamine  100 mg daily, Folic acid  1 mg daily, and Multivitamin with minerals daily - CIWA precautions - For elevated CIWA's contact our team Hypomagnesemia Hypokalemia Mg 1.5, K 3.0 - Repleted with Mag and K - AM BMP, Mg Chronic health problem GERD - continue home Protonix  40mg  daily, sucralfate  1 g TID, and Pepcid  40mg  daily BPH - continue home tamsulosin  0.4 mg daily  FEN/GI: Full liquid PPx: Lovenox  Dispo:Home pending clinical improvement . Barriers include HR, pain control.   Subjective:  Patient is complaining of increased nausea, vomiting, and diarrhea since last night. 3 episodes of emesis with liquid, no blood. Large, watery stools every hour. He states this is consistent with the symptoms he normally experiences due to chronic pancreatitis. Patient was given Metamucil overnight which he refused.  He is requesting Imodium .  Patient was upset and states he talked to his PCP yesterday. He states he has a referral for GI at [facility] for work up of possible malignancy. He requested we speak to her about his history medical.    States oxycodone  5 mg does not helping with pain control at this time.  Objective: Temp:  [97.6 F (36.4 C)-98.1 F (36.7 C)] 98 F (36.7 C) (10/17 0509) Pulse Rate:  [114-125] 114 (10/16 1700) Resp:  [12-20] 16 (10/17 0600) BP: (99-119)/(68-90) 115/84 (10/17 0600) SpO2:  [95 %-100 %] 95 % (10/17 0600)  Physical Exam: General: Alert, in NAD Cardiovascular: tachycardic Respiratory: Normal effort of breathing on RA Abdomen: Non-distended Extremities: Moves all four extremities appropriately, normal gait.   Laboratory: Most recent CBC Lab Results  Component Value Date  WBC 8.8 02/22/2024   HGB 9.4 (L) 02/22/2024   HCT 29.0 (L) 02/22/2024   MCV 97.3 02/22/2024   PLT 129 (L) 02/22/2024   Most recent BMP    Latest Ref Rng & Units 02/23/2024    3:37 AM  BMP  Glucose 70 - 99 mg/dL 872    BUN 6 - 20 mg/dL <5   Creatinine 9.38 - 1.24 mg/dL 9.35   Sodium 864 - 854 mmol/L 136   Potassium 3.5 - 5.1 mmol/L 3.0   Chloride 98 - 111 mmol/L 111   CO2 22 - 32 mmol/L 16   Calcium  8.9 - 10.3 mg/dL 7.7     Other pertinent labs: Mg 1.5   Imaging/Diagnostic Tests: None in 24 hours  Jason Gathers, DO 02/23/2024, 7:11 AM  PGY-1, [facility] FPTS Intern pager: [phone], text pages welcome Secure chat group [facility] Teaching Service

## 2024-02-23 NOTE — Assessment & Plan Note (Addendum)
 HR at goal. - Cardiology consulted, appreciate recommendations - Goal HR < 115 - Wean IV diltiazem and start Diltiazem 120 mg PO - Continue Carvedilol  25mg  BID

## 2024-02-23 NOTE — Progress Notes (Addendum)
 Complaint of diarrhea, 5 episodes in 8 hours.  patient states since yesterday, he is asking if he can have imodium . No orders for imodium  . Contacted and informed night team. He states his compliant are not being addressed appropriately. Patient states his pain has not been under control and when he is experiencing pain nothing is done about it except tylenol  and oxycodone  which is not enough. Patients concerns relayed to night team.   05:50, refused metamucil, relayed to night coverage

## 2024-02-24 DIAGNOSIS — I4891 Unspecified atrial fibrillation: Secondary | ICD-10-CM | POA: Diagnosis not present

## 2024-02-24 LAB — BASIC METABOLIC PANEL WITH GFR
Anion gap: 7 (ref 5–15)
BUN: <5 mg/dL — ABNORMAL LOW (ref 6–20)
CO2: 17 mmol/L — ABNORMAL LOW (ref 22–32)
Calcium: 8 mg/dL — ABNORMAL LOW (ref 8.9–10.3)
Chloride: 112 mmol/L — ABNORMAL HIGH (ref 98–111)
Creatinine, Ser: 0.56 mg/dL — ABNORMAL LOW (ref 0.61–1.24)
GFR, Estimated: >60 mL/min (ref >60)
Glucose, Bld: 88 mg/dL (ref 70–99)
Potassium: 4 mmol/L (ref 3.5–5.1)
Sodium: 136 mmol/L (ref 135–145)

## 2024-02-24 LAB — CBC
HCT: 24.3 % — ABNORMAL LOW (ref 39.0–52.0)
Hemoglobin: 8.1 g/dL — ABNORMAL LOW (ref 13.0–17.0)
MCH: 31.9 pg (ref 26.0–34.0)
MCHC: 33.3 g/dL (ref 30.0–36.0)
MCV: 95.7 fL (ref 80.0–100.0)
Platelets: 134 K/uL — ABNORMAL LOW (ref 150–400)
RBC: 2.54 MIL/uL — ABNORMAL LOW (ref 4.22–5.81)
RDW: 21.5 % — ABNORMAL HIGH (ref 11.5–15.5)
WBC: 6.6 K/uL (ref 4.0–10.5)
nRBC: 0.3 % — ABNORMAL HIGH (ref 0.0–0.2)

## 2024-02-24 LAB — MAGNESIUM: Magnesium: 1.2 mg/dL — ABNORMAL LOW (ref 1.7–2.4)

## 2024-02-24 MED ORDER — MAGNESIUM SULFATE 2 GM/50ML IV SOLN
2.0000 g | Freq: Once | INTRAVENOUS | Status: AC
  Administered 2024-02-24: 2 g via INTRAVENOUS
  Filled 2024-02-24: qty 50

## 2024-02-24 MED ORDER — DIGOXIN 0.25 MG/ML IJ SOLN: 0.2500 mg | Freq: Four times a day (QID) | INTRAMUSCULAR | Status: DC

## 2024-02-24 MED ORDER — CALCIUM CARBONATE ANTACID 500 MG PO CHEW
2.0000 | CHEWABLE_TABLET | Freq: Three times a day (TID) | ORAL | Status: DC | PRN
  Administered 2024-02-24 – 2024-02-27 (×8): 400 mg via ORAL
  Filled 2024-02-24 (×8): qty 2

## 2024-02-24 MED ORDER — ONDANSETRON HCL 4 MG/2ML IJ SOLN
4.0000 mg | Freq: Four times a day (QID) | INTRAMUSCULAR | Status: DC | PRN
  Administered 2024-02-24 – 2024-02-29 (×6): 4 mg via INTRAVENOUS
  Filled 2024-02-24 (×6): qty 2

## 2024-02-24 MED ORDER — ALUM & MAG HYDROXIDE-SIMETH 200-200-20 MG/5ML PO SUSP
30.0000 mL | Freq: Once | ORAL | Status: AC
  Administered 2024-02-24: 30 mL via ORAL
  Filled 2024-02-24: qty 30

## 2024-02-24 MED ORDER — DIGOXIN 0.25 MG/ML IJ SOLN: 0.5000 mg | Freq: Once | INTRAMUSCULAR | Status: DC

## 2024-02-24 NOTE — Assessment & Plan Note (Addendum)
 Stable.  CIWA was discontinued overnight. - Continue Phenobarbital taper - Continue Nicotine  patch and nicotine  gum - Continue Thiamine  100 mg daily, Folic acid  1 mg daily, and Multivitamin with minerals daily

## 2024-02-24 NOTE — Telephone Encounter (Signed)
 I left a voicemail messaging sharing that unable to request transfer as PCP and that I do hope he receives the care that he needs during his hospitalization. Encouraged to schedule a hospital follow up visit, ideally transition clinic visit, after discharge.   Christine A. Myra, MD, MS  [facility] at [facility]

## 2024-02-24 NOTE — Assessment & Plan Note (Addendum)
 Acute on chronic.  Diarrhea improving.  Patient received Tums overnight.  - Creon  to 108,000 units TID with meals and PRN with snacks - Multivitamin with minerals daily - Continue Pepcid  40 mg daily - Continue Protonix  40 mg daily - Continue Sucralfate  1 g 3 times daily - GI cocktail ordered  - Pain regimen  - Try K-pad on back, lidoderm  patch to chest/abdomen - Tylenol  1000 mg q6hrs scheduled - Oxycodone  to 7.5mg  q4hrs PRN - GI consulted, appreciate recommendations - Pseudocyst too small to drain, no further interventions - Reassured patient of previous gastric CT findings, encouraged alcohol  and substance cessation - AM BMP, Mg, CBC

## 2024-02-24 NOTE — Plan of Care (Signed)
  Problem: Education: Goal: Knowledge of General Education information will improve Description: Including pain rating scale, medication(s)/side effects and non-pharmacologic comfort measures Outcome: Progressing   Problem: Health Behavior/Discharge Planning: Goal: Ability to manage health-related needs will improve Outcome: Progressing   Problem: Clinical Measurements: Goal: Ability to maintain clinical measurements within normal limits will improve Outcome: Progressing Goal: Will remain free from infection Outcome: Progressing   Problem: Nutrition: Goal: Adequate nutrition will be maintained Outcome: Progressing   Problem: Coping: Goal: Level of anxiety will decrease Outcome: Progressing   Problem: Pain Managment: Goal: General experience of comfort will improve and/or be controlled Outcome: Progressing   Problem: Safety: Goal: Ability to remain free from injury will improve Outcome: Progressing

## 2024-02-24 NOTE — Assessment & Plan Note (Signed)
 HR at goal. - Cardiology consulted, appreciate recommendations - Goal HR < 115 - Wean IV diltiazem and start Diltiazem 120 mg PO - Continue Carvedilol  25mg  BID

## 2024-02-24 NOTE — Assessment & Plan Note (Addendum)
 BPH - continue home tamsulosin  0.4 mg daily

## 2024-02-24 NOTE — Progress Notes (Signed)
 Rounding Note   Patient Name: [name] Date of Encounter: 02/24/2024  [facility] Cardiologist: Lynwood Schilling, MD   Subjective No complaints  Scheduled Meds:  acetaminophen   1,000 mg Oral Q6H   carvedilol   25 mg Oral BID WC   diltiazem  120 mg Oral Q6H   enoxaparin  (LOVENOX ) injection  40 mg Subcutaneous Q24H   famotidine   40 mg Oral Daily   folic acid   1 mg Oral Daily   lidocaine   2 patch Transdermal Q24H   lipase/protease/amylase  108,000 Units Oral TID WC   multivitamin with minerals  1 tablet Oral Daily   nicotine   21 mg Transdermal Daily   pantoprazole   40 mg Oral Daily   phenobarbital  64.8 mg Oral Q8H   Followed by   phenobarbital  32.4 mg Oral Q8H   psyllium  1 packet Oral Daily   sucralfate   1 g Oral TID WC & HS   tamsulosin   0.4 mg Oral Daily   thiamine   100 mg Oral Daily   Or   thiamine   100 mg Intravenous Daily   Continuous Infusions:  diltiazem (CARDIZEM) infusion 20 mg/hr (02/23/24 1553)   PRN Meds: calcium  carbonate, lipase/protease/amylase **AND** lipase/protease/amylase, melatonin, nicotine  polacrilex, oxyCODONE    Vital Signs  Vitals:   02/24/24 0345 02/24/24 0348 02/24/24 0601 02/24/24 0700  BP: 90/65 96/72 (!) 119/95 103/72  Pulse: 100 99 (!) 112 82  Resp: 16 16 19    Temp: 98.1 F (36.7 C) 98.1 F (36.7 C) 98 F (36.7 C) 97.7 F (36.5 C)  TempSrc:   Oral Oral  SpO2: 99% 98% 100%   Weight:      Height:        Intake/Output Summary (Last 24 hours) at 02/24/2024 0827 Last data filed at 02/23/2024 2146 Gross per 24 hour  Intake 1680 ml  Output --  Net 1680 ml      02/20/2024    1:59 PM 02/04/2024   12:39 PM 01/20/2024   12:25 AM  Last 3 Weights  Weight (lbs) 118 lb 118 lb 123 lb 7.3 oz  Weight (kg) 53.524 kg 53.524 kg 56 kg      Telemetry Afib variable rates - Personally Reviewed  ECG  N/a - Personally Reviewed  Physical Exam  GEN: No acute distress.   Neck: No JVD Cardiac: irreg  Respiratory:  Clear to auscultation bilaterally. GI: Soft, nontender, non-distended  MS: No edema; No deformity. Neuro:  Nonfocal  Psych: Normal affect   Labs High Sensitivity Troponin:   Recent Labs  Lab 02/04/24 1450 02/08/24 1839 02/20/24 1418 02/20/24 2125 02/21/24 0239  TROPONINIHS 3 6 5 9 7      Chemistry Recent Labs  Lab 02/20/24 1418 02/20/24 1436 02/21/24 0239 02/22/24 0351 02/23/24 0337 02/24/24 0355  NA 134*   < > 130* 136 136 136  K 3.4*   < > 4.6 3.5 3.0* 4.0  CL 109   < > 107 111 111 112*  CO2 11*  --  12* 16* 16* 17*  GLUCOSE 104*   < > 154* 144* 127* 88  BUN 7   < > 6 <5* <5* <5*  CREATININE 0.71   < > 0.81 0.72 0.64 0.56*  CALCIUM  7.3*  --  7.1* 7.1* 7.7* 8.0*  MG  --    < > 2.0 1.2* 1.5* 1.2*  PROT 7.0  --  6.4* 5.5*  --   --   ALBUMIN  3.6  --  3.3* 2.9*  --   --  AST 80*  --  57* 30  --   --   ALT 36  --  28 23  --   --   ALKPHOS 86  --  82 65  --   --   BILITOT 0.6  --  0.9 0.4  --   --   GFRNONAA >60  --  >60 >60 >60 >60  ANIONGAP 14  --  11 9 9 7    < > = values in this interval not displayed.    Lipids No results for input(s): CHOL, TRIG, HDL, LABVLDL, LDLCALC, CHOLHDL in the last 168 hours.  Hematology Recent Labs  Lab 02/22/24 0351 02/22/24 0959 02/24/24 0355  WBC 9.2 8.8 6.6  RBC 2.74* 2.98* 2.54*  HGB 8.7* 9.4* 8.1*  HCT 26.3* 29.0* 24.3*  MCV 96.0 97.3 95.7  MCH 31.8 31.5 31.9  MCHC 33.1 32.4 33.3  RDW 21.7* 22.2* 21.5*  PLT 127* 129* 134*   Thyroid   Recent Labs  Lab 02/20/24 2125  TSH 0.913  FREET4 0.74    BNP Recent Labs  Lab 02/20/24 1431  BNP 100.3*    DDimer No results for input(s): DDIMER in the last 168 hours.   Radiology  No results found.    Patient Profile   53 y.o. 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 Gennaro.   Assessment & Plan   1.Afib with RVR, episodic aberancy - Unfortunately his options are limited as he is not anticoagulated in the setting of noncompliance as well as polysubstance abuse including alcohol . Therefore not a candidate for cardioversion or IV amiodarone  at this point.  - working on rate control. On dilt gtt at 20mg /hr, oral dilt added 120mg  every 6 hours. On coreg  25mg  bid. Could consider metoprolol  for less bp effects but with cocaine history would favor having some alpha antagonism.  - off dilt gtt, rates low 100s though higher at times. SBPs low 100s. Not much room to titrate av nodal agents further. Concern with dig use given EtoH, chronic electolyte abnormalities and increased risk of toxicity.  - follow rates today, essentially limited options. If around 110 or less would hold with current regimen.    2.Polysubstance abuse -- History of cocaine abuse, EtOH use states he drinks about a sixpack per day   3.Abdominal pain/chronic pancreatitis -  For questions or updates, please contact [facility] Please consult [email or web address] for contact info under       Signed, Alvan Carrier, MD  02/24/2024, 8:27 AM

## 2024-02-24 NOTE — Progress Notes (Signed)
 Daily Progress Note Intern Pager: [phone]  Patient name: [name] Medical record number: [id number] Date of birth: [birth year] Age: 53 y.o. Gender: male  Primary Care Provider: Myra Wanda LABOR, MD Consultants: Cardiology, GI (s/o) Code Status: Full   Pt Overview and Major Events to Date:  10/14: Admitted   Medical Decision Making:  [name] is a 53 y.o. male with a PMH of paroxysmal A-fib, WPW s/p ablation, chronic pancreatitis, polysubstance use disorder, anxiety/depression, chronic bronchitis admitted for A-fib with RVR 2/2 chronic EtOH use and dehydration and chronic pancreatitis. Switched to oral diltiazem, HR maintaining <115.  Assessment & Plan Atrial fibrillation with RVR (HCC) HR at goal. - Cardiology consulted, appreciate recommendations - Goal HR < 115 - Wean IV diltiazem and start Diltiazem 120 mg PO - Continue Carvedilol  25mg  BID Abdominal pain Chronic pancreatitis (HCC) Acute on chronic.  Diarrhea improving.  Patient received Tums overnight.  - Creon  to 108,000 units TID with meals and PRN with snacks - Multivitamin with minerals daily - Continue Pepcid  40 mg daily - Continue Protonix  40 mg daily - Continue Sucralfate  1 g 3 times daily - GI cocktail ordered  - Pain regimen  - Try K-pad on back, lidoderm  patch to chest/abdomen - Tylenol  1000 mg q6hrs scheduled - Oxycodone  to 7.5mg  q4hrs PRN - GI consulted, appreciate recommendations - Pseudocyst too small to drain, no further interventions - Reassured patient of previous gastric CT findings, encouraged alcohol  and substance cessation - AM BMP, Mg, CBC Substance use disorder Stable.  CIWA was discontinued overnight. - Continue Phenobarbital taper - Continue Nicotine  patch and nicotine  gum - Continue Thiamine  100 mg daily, Folic acid  1 mg daily, and Multivitamin with minerals daily Hypomagnesemia Hypokalemia Mg 1.2, K 4.0 - Repleted Mag  - AM BMP, Mg Chronic health  problem BPH - continue home tamsulosin  0.4 mg daily    FEN/GI: Full liquid PPx: Lovenox   Dispo: Home pending clinical improvement.  Subjective:  Patient seen lying in bed this morning.  He tells me his heart rate has improved this morning, but he had a couple of skipping beats last night.  Currently his heart feels fine.  He endorses continuing diarrhea, however, states that it improved yesterday.  He states he is now going every other hour instead of hourly.  Patient continues to have abdominal pain he currently rates it a 7/10.  He states he is able to eat a little bit of food at a time without having too much pain.  He currently endorses stomach burning.  He states that the Tums did help some last night.  However, he says that the GI cocktail helps best.  Objective: Temp:  [97.4 F (36.3 C)-98.1 F (36.7 C)] 98 F (36.7 C) (10/18 0601) Pulse Rate:  [88-112] 112 (10/18 0601) Resp:  [12-20] 19 (10/18 0601) BP: (90-121)/(65-95) 119/95 (10/18 0601) SpO2:  [98 %-100 %] 100 % (10/18 0601) Physical Exam: General: NAD Cardiovascular: RRR, no M/R/G Respiratory: CTAB, normal work of breathing on room air Abdomen: Soft, nondistended, tender to palpation in the epigastric area and LLQ Extremities: Warm, dry, no edema  Laboratory: Most recent CBC Lab Results  Component Value Date   WBC 6.6 02/24/2024   HGB 8.1 (L) 02/24/2024   HCT 24.3 (L) 02/24/2024   MCV 95.7 02/24/2024   PLT 134 (L) 02/24/2024   Most recent BMP    Latest Ref Rng & Units 02/24/2024    3:55 AM  BMP  Glucose 70 -  99 mg/dL 88   BUN 6 - 20 mg/dL <5   Creatinine 9.38 - 1.24 mg/dL 9.43   Sodium 864 - 854 mmol/L 136   Potassium 3.5 - 5.1 mmol/L 4.0   Chloride 98 - 111 mmol/L 112   CO2 22 - 32 mmol/L 17   Calcium  8.9 - 10.3 mg/dL 8.0     Imaging/Diagnostic Tests: No new imaging.  Jason Raguel MATSU, DO 02/24/2024, 7:48 AM  PGY-1, [facility] Family Medicine FPTS Intern pager: [phone], text pages  welcome Secure chat group [facility] Teaching Service

## 2024-02-24 NOTE — Assessment & Plan Note (Signed)
 Mg 1.2, K 4.0 - Repleted Mag  - AM BMP, Mg

## 2024-02-24 NOTE — Progress Notes (Signed)
 Patient is requesting a G.I. cocktail for stomach burning. Text page Dr. IVAR Morrison.Tim R.N. aware.

## 2024-02-25 DIAGNOSIS — D649 Anemia, unspecified: Secondary | ICD-10-CM | POA: Insufficient documentation

## 2024-02-25 DIAGNOSIS — I4891 Unspecified atrial fibrillation: Secondary | ICD-10-CM | POA: Diagnosis not present

## 2024-02-25 LAB — BASIC METABOLIC PANEL WITH GFR
Anion gap: 7 (ref 5–15)
BUN: <5 mg/dL — ABNORMAL LOW (ref 6–20)
CO2: 18 mmol/L — ABNORMAL LOW (ref 22–32)
Calcium: 8 mg/dL — ABNORMAL LOW (ref 8.9–10.3)
Chloride: 109 mmol/L (ref 98–111)
Creatinine, Ser: 0.67 mg/dL (ref 0.61–1.24)
GFR, Estimated: >60 mL/min (ref >60)
Glucose, Bld: 52 mg/dL — ABNORMAL LOW (ref 70–99)
Potassium: 4.6 mmol/L (ref 3.5–5.1)
Sodium: 134 mmol/L — ABNORMAL LOW (ref 135–145)

## 2024-02-25 LAB — CBC
HCT: 26.5 % — ABNORMAL LOW (ref 39.0–52.0)
Hemoglobin: 8.5 g/dL — ABNORMAL LOW (ref 13.0–17.0)
MCH: 31.7 pg (ref 26.0–34.0)
MCHC: 32.1 g/dL (ref 30.0–36.0)
MCV: 98.9 fL (ref 80.0–100.0)
Platelets: 149 K/uL — ABNORMAL LOW (ref 150–400)
RBC: 2.68 MIL/uL — ABNORMAL LOW (ref 4.22–5.81)
RDW: 21.9 % — ABNORMAL HIGH (ref 11.5–15.5)
WBC: 6.1 K/uL (ref 4.0–10.5)
nRBC: 0 % (ref 0.0–0.2)

## 2024-02-25 LAB — MAGNESIUM: Magnesium: 1.1 mg/dL — ABNORMAL LOW (ref 1.7–2.4)

## 2024-02-25 MED ORDER — MAGNESIUM SULFATE 4 GM/100ML IV SOLN
4.0000 g | Freq: Once | INTRAVENOUS | Status: AC
  Administered 2024-02-25: 4 g via INTRAVENOUS
  Filled 2024-02-25: qty 100

## 2024-02-25 MED ORDER — DILTIAZEM HCL ER COATED BEADS 120 MG PO CP24
240.0000 mg | ORAL_CAPSULE | Freq: Two times a day (BID) | ORAL | Status: DC
Start: 2024-02-26 — End: 2024-03-01
  Administered 2024-02-26 – 2024-03-01 (×9): 240 mg via ORAL
  Filled 2024-02-25 (×9): qty 2

## 2024-02-25 MED ORDER — LOPERAMIDE HCL 2 MG PO CAPS
2.0000 mg | ORAL_CAPSULE | Freq: Once | ORAL | Status: AC
  Administered 2024-02-25: 2 mg via ORAL
  Filled 2024-02-25: qty 1

## 2024-02-25 MED ORDER — OXYCODONE HCL 5 MG PO TABS
5.0000 mg | ORAL_TABLET | Freq: Once | ORAL | Status: AC
  Administered 2024-02-25: 5 mg via ORAL
  Filled 2024-02-25: qty 1

## 2024-02-25 NOTE — Plan of Care (Signed)
  Problem: Education: Goal: Knowledge of General Education information will improve Description: Including pain rating scale, medication(s)/side effects and non-pharmacologic comfort measures Outcome: Progressing   Problem: Health Behavior/Discharge Planning: Goal: Ability to manage health-related needs will improve Outcome: Progressing   Problem: Clinical Measurements: Goal: Ability to maintain clinical measurements within normal limits will improve Outcome: Progressing Goal: Will remain free from infection Outcome: Progressing Goal: Diagnostic test results will improve Outcome: Progressing Goal: Respiratory complications will improve Outcome: Progressing Goal: Cardiovascular complication will be avoided Outcome: Progressing   Problem: Activity: Goal: Risk for activity intolerance will decrease Outcome: Progressing   Problem: Coping: Goal: Level of anxiety will decrease Outcome: Progressing   Problem: Safety: Goal: Ability to remain free from injury will improve Outcome: Progressing

## 2024-02-25 NOTE — Assessment & Plan Note (Addendum)
 Mg 1.1, K 4.6. Low Mg likely 2/2 chronic etoh use. - Repleted Mag 4g IV - AM BMP, Mg

## 2024-02-25 NOTE — Assessment & Plan Note (Addendum)
 HR at goal. - Cardiology consulted, appreciate recommendations (signed off, will arrange outpatient f/u) - Goal HR < 115 - Continue Diltiazem 120 mg q6hrs PO - Transition to Dilitaizem 240mg  BID long-acting tomorrow - Continue Carvedilol  25mg  BID

## 2024-02-25 NOTE — Assessment & Plan Note (Addendum)
 Acute on chronic. Overall improving. Transitioned to regular diet. - Continue Creon  108,000 units TID with meals and PRN with snacks - Multivitamin with minerals daily - Continue Pepcid  40 mg daily - Continue Protonix  40 mg daily - Continue Sucralfate  1 g TID - Pain regimen  - Try K-pad on back, lidoderm  patch to chest/abdomen - Tylenol  1000 mg q6hrs scheduled - Oxycodone  7.5mg  q4hrs PRN - GI consulted, appreciate recommendations - Pseudocyst too small to drain, no further interventions - Reassured patient of previous gastric CT findings, encouraged alcohol  and substance cessation - AM BMP, Mg, CBC - Tums 400mg  TID PRN - Immodium 2g x 1

## 2024-02-25 NOTE — Progress Notes (Addendum)
 Rounding Note   Patient Name: [name] Date of Encounter: 02/25/2024  [facility] Cardiologist: Lynwood Schilling, MD   Subjective No complaints  Scheduled Meds:  acetaminophen   1,000 mg Oral Q6H   carvedilol   25 mg Oral BID WC   diltiazem  120 mg Oral Q6H   enoxaparin  (LOVENOX ) injection  40 mg Subcutaneous Q24H   famotidine   40 mg Oral Daily   folic acid   1 mg Oral Daily   lidocaine   2 patch Transdermal Q24H   lipase/protease/amylase  108,000 Units Oral TID WC   multivitamin with minerals  1 tablet Oral Daily   nicotine   21 mg Transdermal Daily   pantoprazole   40 mg Oral Daily   phenobarbital  32.4 mg Oral Q8H   sucralfate   1 g Oral TID WC & HS   tamsulosin   0.4 mg Oral Daily   thiamine   100 mg Oral Daily   Or   thiamine   100 mg Intravenous Daily   Continuous Infusions:  diltiazem (CARDIZEM) infusion 20 mg/hr (02/23/24 1553)   PRN Meds: calcium  carbonate, lipase/protease/amylase **AND** lipase/protease/amylase, nicotine  polacrilex, ondansetron  (ZOFRAN ) IV, oxyCODONE    Vital Signs  Vitals:   02/24/24 1542 02/24/24 1808 02/24/24 2346 02/25/24 0739  BP: 96/70 115/82 97/62 112/77  Pulse: (!) 102   83  Resp: 12  17 13   Temp: 98 F (36.7 C)  98 F (36.7 C) 97.7 F (36.5 C)  TempSrc: Oral  Oral Oral  SpO2: 95%  100% 100%  Weight:      Height:        Intake/Output Summary (Last 24 hours) at 02/25/2024 0857 Last data filed at 02/25/2024 0730 Gross per 24 hour  Intake 720 ml  Output 300 ml  Net 420 ml      02/20/2024    1:59 PM 02/04/2024   12:39 PM 01/20/2024   12:25 AM  Last 3 Weights  Weight (lbs) 118 lb 118 lb 123 lb 7.3 oz  Weight (kg) 53.524 kg 53.524 kg 56 kg      Telemetry Afib rates 80s to 100 - Personally Reviewed  ECG  N/a - Personally Reviewed  Physical Exam  GEN: No acute distress.   Neck: No JVD Cardiac: irreg Respiratory: Clear to auscultation bilaterally. GI: Soft, nontender, non-distended  MS: No edema;  No deformity. Neuro:  Nonfocal  Psych: Normal affect   Labs High Sensitivity Troponin:   Recent Labs  Lab 02/04/24 1450 02/08/24 1839 02/20/24 1418 02/20/24 2125 02/21/24 0239  TROPONINIHS 3 6 5 9 7      Chemistry Recent Labs  Lab 02/20/24 1418 02/20/24 1436 02/21/24 0239 02/22/24 0351 02/23/24 0337 02/24/24 0355 02/25/24 0342  NA 134*   < > 130* 136 136 136 134*  K 3.4*   < > 4.6 3.5 3.0* 4.0 4.6  CL 109   < > 107 111 111 112* 109  CO2 11*  --  12* 16* 16* 17* 18*  GLUCOSE 104*   < > 154* 144* 127* 88 52*  BUN 7   < > 6 <5* <5* <5* <5*  CREATININE 0.71   < > 0.81 0.72 0.64 0.56* 0.67  CALCIUM  7.3*  --  7.1* 7.1* 7.7* 8.0* 8.0*  MG  --    < > 2.0 1.2* 1.5* 1.2* 1.1*  PROT 7.0  --  6.4* 5.5*  --   --   --   ALBUMIN  3.6  --  3.3* 2.9*  --   --   --  AST 80*  --  57* 30  --   --   --   ALT 36  --  28 23  --   --   --   ALKPHOS 86  --  82 65  --   --   --   BILITOT 0.6  --  0.9 0.4  --   --   --   GFRNONAA >60  --  >60 >60 >60 >60 >60  ANIONGAP 14  --  11 9 9 7 7    < > = values in this interval not displayed.    Lipids No results for input(s): CHOL, TRIG, HDL, LABVLDL, LDLCALC, CHOLHDL in the last 168 hours.  Hematology Recent Labs  Lab 02/22/24 0351 02/22/24 0959 02/24/24 0355  WBC 9.2 8.8 6.6  RBC 2.74* 2.98* 2.54*  HGB 8.7* 9.4* 8.1*  HCT 26.3* 29.0* 24.3*  MCV 96.0 97.3 95.7  MCH 31.8 31.5 31.9  MCHC 33.1 32.4 33.3  RDW 21.7* 22.2* 21.5*  PLT 127* 129* 134*   Thyroid   Recent Labs  Lab 02/20/24 2125  TSH 0.913  FREET4 0.74    BNP Recent Labs  Lab 02/20/24 1431  BNP 100.3*    DDimer No results for input(s): DDIMER in the last 168 hours.   Radiology  No results found.  Cardiac Studies   Patient Profile   53 y.o. 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 Gennaro.   Assessment & Plan   1.Afib with RVR, episodic aberancy - from notes EP ok with av nodal agents, no clear preexcitation with his prior history of WPW.  - Unfortunately his options are limited as he is not anticoagulated in the setting of noncompliance as well as polysubstance abuse including alcohol . Therefore not a candidate for cardioversion or IV amiodarone  at this point.  - working on rate control. Weaned off dilt drip. On coreg  25mg  bid. Could consider metoprolol  for less bp effects but with cocaine history would favor having some alpha antagonism. Also on dilt 120mg  every 6 hours. BP's soft at times but overall tolerating aggressive av nodal agent regimen.  - Concern with dig use given EtoH, chronic electolyte abnormalities and increased risk of toxicity.  - from notes has not been on anticoag due to low CHADS2Vasc score of 1, chronic EtoH use and poor medical compliance.   - all limitations considered as listed above rates reasonably managed. Consolidate dilt to 240mg  long acting bid starting tomorrow since got short acting dose today.  - no additoinal cardiology recs at this time, we will sign off inpatient care and arrange f/u     2.Polysubstance abuse -- History of cocaine abuse, EtOH use states he drinks about a sixpack per day     3.Abdominal pain/chronic pancreatitis     For questions or updates, please contact [facility] Please consult [email or web address] for contact info under       Signed, Alvan Carrier, MD  02/25/2024, 8:57 AM

## 2024-02-25 NOTE — Assessment & Plan Note (Signed)
 BPH - continue home tamsulosin  0.4 mg daily

## 2024-02-25 NOTE — Assessment & Plan Note (Signed)
 Stable.  CIWA was discontinued overnight. - Continue Phenobarbital taper - Continue Nicotine  patch and nicotine  gum - Continue Thiamine  100 mg daily, Folic acid  1 mg daily, and Multivitamin with minerals daily

## 2024-02-25 NOTE — Plan of Care (Signed)
 Received report from nursing that patient is complaining of abdominal pain and 1 episode of vomiting. Went to see patient who is laying  on his side in the bed. He states he threw up the meal he ate earlier, no blood in the vomit. He believes he may have eaten too fast. On exam, abdomen is soft, non-distended with TTP to the LUQ that is increased from earlier today. Will check on patient again later to see if he is still stable for discharge. Ideally would like for him to be able to tolerate PO intake before discharge.

## 2024-02-25 NOTE — Assessment & Plan Note (Signed)
 Hgb 8.1 yesterday. Hgb has been downtrending this admission but patient does have a h/o of anemia, likely 2/2 iron deficiency - Added on CBC to today's labs - Recommend outpatient follow up

## 2024-02-25 NOTE — Progress Notes (Signed)
 Daily Progress Note Intern Pager: [phone]  Patient name: [name] Medical record number: [id number] Date of birth: May 03, 1971 Age: 53 y.o. Gender: male  Primary Care Provider: Myra Wanda LABOR, MD Consultants: Cardiology, GI (s/o) Code Status: Full code  Pt Overview and Major Events to Date:  10/14 - Admitted  Assessment and Plan:  [name] is a 53 y.o. male with a PMH of paroxysmal A-fib, WPW s/p ablation, chronic pancreatitis, polysubstance use disorder, anxiety/depression, chronic bronchitis admitted for A-fib with RVR 2/2 chronic EtOH use and dehydration and chronic pancreatitis. Switched to oral diltiazem, HR maintaining <115. Stable for discharge today if he is able to tolerate regular diet. Assessment & Plan Atrial fibrillation with RVR (HCC) HR at goal. - Cardiology consulted, appreciate recommendations (signed off, will arrange outpatient f/u) - Goal HR < 115 - Continue Diltiazem 120 mg q6hrs PO - Transition to Dilitaizem 240mg  BID long-acting tomorrow - Continue Carvedilol  25mg  BID Abdominal pain Chronic pancreatitis (HCC) Acute on chronic. Overall improving. Transitioned to regular diet. - Continue Creon  108,000 units TID with meals and PRN with snacks - Multivitamin with minerals daily - Continue Pepcid  40 mg daily - Continue Protonix  40 mg daily - Continue Sucralfate  1 g TID - Pain regimen  - Try K-pad on back, lidoderm  patch to chest/abdomen - Tylenol  1000 mg q6hrs scheduled - Oxycodone  7.5mg  q4hrs PRN - GI consulted, appreciate recommendations - Pseudocyst too small to drain, no further interventions - Reassured patient of previous gastric CT findings, encouraged alcohol  and substance cessation - AM BMP, Mg, CBC - Tums 400mg  TID PRN - Immodium 2g x 1 Hypomagnesemia Hypokalemia Mg 1.1, K 4.6. Low Mg likely 2/2 chronic etoh use. - Repleted Mag 4g IV - AM BMP, Mg Anemia Hgb 8.1 yesterday. Hgb has been downtrending this  admission but patient does have a h/o of anemia, likely 2/2 iron deficiency - Added on CBC to today's labs - Recommend outpatient follow up Substance use disorder Stable. CIWA was discontinued overnight. - Continue Phenobarbital taper - Continue Nicotine  patch and nicotine  gum - Continue Thiamine  100 mg daily, Folic acid  1 mg daily, and Multivitamin with minerals daily Chronic health problem BPH - continue home tamsulosin  0.4 mg daily  FEN/GI: Regular PPx: Lovenox  Dispo:Home today if patient is tolerating PO intake   Subjective:  ON: Patient c/o nausea, given zofran  x1.  Patient states he is doing well. States abdominal pain is improved.  Objective: Temp:  [97.7 F (36.5 C)-98 F (36.7 C)] 98 F (36.7 C) (10/18 2346) Pulse Rate:  [86-102] 102 (10/18 1542) Resp:  [12-17] 17 (10/18 2346) BP: (96-115)/(62-82) 97/62 (10/18 2346) SpO2:  [95 %-100 %] 100 % (10/18 2346)  Physical Exam: General: Alert, well-appearing male in NAD.  Cardiovascular: Irregular, no m/r/g appreciated. Pulmonary: Normal WOB. CTAB with no w/c/r present. Abdomen: Soft, non-tender, non-distended. Extremities: Warm and well-perfused, without cyanosis or edema.  Laboratory: Most recent CBC Lab Results  Component Value Date   WBC 6.6 02/24/2024   HGB 8.1 (L) 02/24/2024   HCT 24.3 (L) 02/24/2024   MCV 95.7 02/24/2024   PLT 134 (L) 02/24/2024   Most recent BMP    Latest Ref Rng & Units 02/25/2024    3:42 AM  BMP  Glucose 70 - 99 mg/dL 52   BUN 6 - 20 mg/dL <5   Creatinine 9.38 - 1.24 mg/dL 9.32   Sodium 864 - 854 mmol/L 134   Potassium 3.5 - 5.1 mmol/L 4.6  Chloride 98 - 111 mmol/L 109   CO2 22 - 32 mmol/L 18   Calcium  8.9 - 10.3 mg/dL 8.0     Other pertinent labs: Mg 1.1   Imaging/Diagnostic Tests: None in 24 hours  Jerrie Gathers, DO 02/25/2024, 7:29 AM  PGY-1, [facility] FPTS Intern pager: [phone], text pages welcome Secure chat group [facility] Teaching Service

## 2024-02-26 DIAGNOSIS — K861 Other chronic pancreatitis: Secondary | ICD-10-CM | POA: Diagnosis not present

## 2024-02-26 LAB — IRON AND TIBC
Iron: 28 ug/dL — ABNORMAL LOW (ref 45–182)
Saturation Ratios: 10 % — ABNORMAL LOW (ref 17.9–39.5)
TIBC: 273 ug/dL (ref 250–450)
UIBC: 245 ug/dL

## 2024-02-26 LAB — CBC
HCT: 24.6 % — ABNORMAL LOW (ref 39.0–52.0)
Hemoglobin: 8 g/dL — ABNORMAL LOW (ref 13.0–17.0)
MCH: 31.1 pg (ref 26.0–34.0)
MCHC: 32.5 g/dL (ref 30.0–36.0)
MCV: 95.7 fL (ref 80.0–100.0)
Platelets: 145 K/uL — ABNORMAL LOW (ref 150–400)
RBC: 2.57 MIL/uL — ABNORMAL LOW (ref 4.22–5.81)
RDW: 21.5 % — ABNORMAL HIGH (ref 11.5–15.5)
WBC: 6 K/uL (ref 4.0–10.5)
nRBC: 0 % (ref 0.0–0.2)

## 2024-02-26 LAB — BASIC METABOLIC PANEL WITH GFR
Anion gap: 8 (ref 5–15)
BUN: 9 mg/dL (ref 6–20)
CO2: 21 mmol/L — ABNORMAL LOW (ref 22–32)
Calcium: 8.8 mg/dL — ABNORMAL LOW (ref 8.9–10.3)
Chloride: 106 mmol/L (ref 98–111)
Creatinine, Ser: 0.85 mg/dL (ref 0.61–1.24)
GFR, Estimated: >60 mL/min (ref >60)
Glucose, Bld: 97 mg/dL (ref 70–99)
Potassium: 5 mmol/L (ref 3.5–5.1)
Sodium: 135 mmol/L (ref 135–145)

## 2024-02-26 LAB — MAGNESIUM: Magnesium: 1 mg/dL — ABNORMAL LOW (ref 1.7–2.4)

## 2024-02-26 LAB — FOLATE: Folate: >20 ng/mL (ref >5.9)

## 2024-02-26 LAB — FERRITIN: Ferritin: 35 ng/mL (ref 24–336)

## 2024-02-26 LAB — VITAMIN B12: Vitamin B-12: 517 pg/mL (ref 180–914)

## 2024-02-26 MED ORDER — OXYCODONE HCL 5 MG PO TABS
2.5000 mg | ORAL_TABLET | Freq: Once | ORAL | Status: AC
  Administered 2024-02-26: 2.5 mg via ORAL
  Filled 2024-02-26: qty 1

## 2024-02-26 MED ORDER — MAGNESIUM OXIDE -MG SUPPLEMENT 400 (240 MG) MG PO TABS
400.0000 mg | ORAL_TABLET | Freq: Every day | ORAL | Status: DC
  Administered 2024-02-26 – 2024-03-01 (×5): 400 mg via ORAL
  Filled 2024-02-26 (×5): qty 1

## 2024-02-26 MED ORDER — HYDROXYZINE HCL 25 MG PO TABS
50.0000 mg | ORAL_TABLET | Freq: Once | ORAL | Status: AC
  Administered 2024-02-26: 50 mg via ORAL
  Filled 2024-02-26: qty 2

## 2024-02-26 MED ORDER — MAGNESIUM SULFATE 4 GM/100ML IV SOLN
4.0000 g | Freq: Once | INTRAVENOUS | Status: AC
  Administered 2024-02-26: 4 g via INTRAVENOUS
  Filled 2024-02-26: qty 100

## 2024-02-26 MED ORDER — BOOST / RESOURCE BREEZE PO LIQD CUSTOM
1.0000 | Freq: Three times a day (TID) | ORAL | Status: DC
Start: 2024-02-26 — End: 2024-03-01
  Administered 2024-02-26 – 2024-03-01 (×5): 1 via ORAL

## 2024-02-26 MED ORDER — MAGNESIUM SULFATE 2 GM/50ML IV SOLN: 2.0000 g | Freq: Once | INTRAVENOUS | Status: DC

## 2024-02-26 MED ORDER — FERROUS SULFATE 325 (65 FE) MG PO TABS
325.0000 mg | ORAL_TABLET | Freq: Every day | ORAL | Status: DC
Start: 2024-02-27 — End: 2024-03-01
  Administered 2024-02-27 – 2024-03-01 (×3): 325 mg via ORAL
  Filled 2024-02-26 (×4): qty 1

## 2024-02-26 MED ORDER — HYDROMORPHONE HCL 1 MG/ML IJ SOLN
0.5000 mg | Freq: Once | INTRAMUSCULAR | Status: AC
  Administered 2024-02-26: 0.5 mg via INTRAVENOUS
  Filled 2024-02-26 (×2): qty 0.5

## 2024-02-26 NOTE — Assessment & Plan Note (Signed)
 Stable. CIWAs discontinued - Continue Phenobarbital taper - Continue Nicotine  patch and nicotine  gum - Continue Thiamine  100 mg daily, Folic acid  1 mg daily, and Multivitamin with minerals daily

## 2024-02-26 NOTE — Plan of Care (Signed)
 Was paged that patient was experiencing abdominal pain. Saw pt at bedside shortly after (around 1am). Pt expressed ongoing abdominal pain and was asking for Dilaudid  or Morphine . Was having trouble sleeping because of the pain. - Oxycodone  2.5mg  once - Hydroxyzine  50mg  once

## 2024-02-26 NOTE — Assessment & Plan Note (Addendum)
 Mg 1.0, K 5.0. Low Mg likely 2/2 chronic etoh use. - Repleted Mag 4g IV TOTAL - AM BMP, Mg - Restarting home Magnesium  oxide 400mg  daily

## 2024-02-26 NOTE — Assessment & Plan Note (Addendum)
 Hgb 8.0 this AM. Hgb has been downtrending this admission but patient does have a h/o of anemia. Will evaluate further as below. - Transfusion threshold Hgb < 7 - AM CBC, iron, ferritin, TIBC, Vitamin B12 and folate

## 2024-02-26 NOTE — Assessment & Plan Note (Deleted)
 Acute on chronic. Overall improving. Transitioned to regular diet. - Continue Creon  108,000 units TID with meals and PRN with snacks - Multivitamin with minerals daily - Continue Pepcid  40 mg daily - Continue Protonix  40 mg daily - Continue Sucralfate  1 g TID - Pain regimen  - Try K-pad on back, lidoderm  patch to chest/abdomen - Tylenol  1000 mg q6hrs scheduled - Oxycodone  7.5mg  q4hrs PRN - GI consulted, appreciate recommendations - Pseudocyst too small to drain, no further interventions - Reassured patient of previous gastric CT findings, encouraged alcohol  and substance cessation - AM BMP, Mg, CBC - Tums 400mg  TID PRN - Immodium 2g x 1

## 2024-02-26 NOTE — Progress Notes (Signed)
 Patient observed intentionally rolling on the floor, not due to a fall. He is yelling and verbalizing abdominal pain. Patient is cursing at the physician and expressing frustration, stating that no one is helping him. Please refer to the [facility] for oxycodone  administrations last night. Patient continues to demand IV Dilaudid .  When asked to get off the floor, patient stated he was "just trying to find space to move around." RN advised patient that this behavior is unsafe and may result in injury. Patient was reminded to remain in bed or seated for his safety.

## 2024-02-26 NOTE — Assessment & Plan Note (Signed)
 Acute on chronic. Was unable to tolerate regular diet and reported increased nausea and abdominal pain. Now NPO. Gave an additional 2.5 mg oxycodone  this AM. - Continue Creon  108,000 units TID with meals and PRN with snacks - Continue daily regimen: Multivitamin with minerals, Pepcid  40mg , Protonix  40mg , Sucralfate  1g TID - Pain regimen  - Try K-pad on back, lidoderm  patch to chest/abdomen - Tylenol  1000 mg q6hrs scheduled - Oxycodone  7.5mg  q4hrs PRN - GI consulted, appreciate recommendations - Pseudocyst too small to drain, no further interventions - AM BMP, Mg, CBC - Tums 400mg  TID PRN - Immodium 2g x 1

## 2024-02-26 NOTE — Assessment & Plan Note (Signed)
 HR at goal. - Cardiology consulted, appreciate recommendations (s/o) - Goal HR < 115 - Start Diltiazem 24hr 240mg  BID - Continue Carvedilol  25mg  BID

## 2024-02-26 NOTE — Progress Notes (Signed)
 Daily Progress Note Intern Pager: [phone]  Patient name: [name] Medical record number: [id number] Date of birth: 1970-09-18 Age: 53 y.o. Gender: male  Primary Care Provider: Myra Wanda LABOR, MD Consultants: Cardiology (s/o), GI (s/o) Code Status: Full code  Pt Overview and Major Events to Date:  10/14 - Admitted   Assessment and Plan:  [name] is a 53 y.o. male with a PMH of paroxysmal A-fib, WPW s/p ablation, chronic pancreatitis, polysubstance use disorder, anxiety/depression, chronic bronchitis admitted for A-fib with RVR 2/2 chronic EtOH use and dehydration and chronic pancreatitis. Now rate controlled with oral diltiazem. Continued nausea and abdominal pain after restarting regular diet yesterday. Will keep NPO for now and advance as tolerated.  Assessment & Plan Chronic pancreatitis (HCC) Abdominal pain Acute on chronic. Was unable to tolerate regular diet and reported increased nausea and abdominal pain. Now NPO. Gave an additional 2.5 mg oxycodone  this AM. - Continue Creon  108,000 units TID with meals and PRN with snacks - Continue daily regimen: Multivitamin with minerals, Pepcid  40mg , Protonix  40mg , Sucralfate  1g TID - Pain regimen  - Try K-pad on back, lidoderm  patch to chest/abdomen - Tylenol  1000 mg q6hrs scheduled - Oxycodone  7.5mg  q4hrs PRN - GI consulted, appreciate recommendations - Pseudocyst too small to drain, no further interventions - AM BMP, Mg, CBC - Tums 400mg  TID PRN - Immodium 2g x 1 Hypomagnesemia Hypokalemia Mg 1.0, K 5.0. Low Mg likely 2/2 chronic etoh use. - Repleted Mag 4g IV TOTAL - AM BMP, Mg - Restarting home Magnesium  oxide 400mg  daily Anemia Hgb 8.0 this AM. Hgb has been downtrending this admission but patient does have a h/o of anemia. Will evaluate further as below. - Transfusion threshold Hgb < 7 - AM CBC, iron, ferritin, TIBC, Vitamin B12 and folate Atrial fibrillation with RVR (HCC) HR at  goal. - Cardiology consulted, appreciate recommendations (s/o) - Goal HR < 115 - Start Diltiazem 24hr 240mg  BID - Continue Carvedilol  25mg  BID Substance use disorder Stable. CIWAs discontinued - Continue Phenobarbital taper - Continue Nicotine  patch and nicotine  gum - Continue Thiamine  100 mg daily, Folic acid  1 mg daily, and Multivitamin with minerals daily Chronic health problem BPH - continue home tamsulosin  0.4 mg daily   FEN/GI: Clear liquids PPx: Lovenox  Dispo:Home pending clinical improvement .   Subjective:  Py laying in fetal position in bed. States he has been in pain since eating yesterday. Felt somewhat better overnight after additional 2.5 mg of oxycodone  but pain has since returned.  Open to advancing diet as tolerated.  Objective: Temp:  [97.7 F (36.5 C)-98.8 F (37.1 C)] 98.1 F (36.7 C) (10/20 0318) Pulse Rate:  [82-111] 92 (10/20 0318) Resp:  [10-25] 12 (10/20 0318) BP: (108-134)/(76-93) 113/76 (10/20 0318) SpO2:  [99 %-100 %] 100 % (10/20 0318)  Physical Exam: General: Alert, uncomfortable, doubled over in pain Cardiovascular: Irregular rate, no m/r/g appreciated Pulmonary: Normal WOB. CTAB with no w/c/r present. Abdomen: Normoactive bowel sounds. Soft, non-distended. TTP in LUQ Extremities: Moves all four extremities appropriately  Laboratory: Most recent CBC Lab Results  Component Value Date   WBC 6.0 02/26/2024   HGB 8.0 (L) 02/26/2024   HCT 24.6 (L) 02/26/2024   MCV 95.7 02/26/2024   PLT 145 (L) 02/26/2024   Most recent BMP    Latest Ref Rng & Units 02/26/2024    3:28 AM  BMP  Glucose 70 - 99 mg/dL 97   BUN 6 - 20 mg/dL 9  Creatinine 0.61 - 1.24 mg/dL 9.14   Sodium 864 - 854 mmol/L 135   Potassium 3.5 - 5.1 mmol/L 5.0   Chloride 98 - 111 mmol/L 106   CO2 22 - 32 mmol/L 21   Calcium  8.9 - 10.3 mg/dL 8.8     Imaging/Diagnostic Tests: No new labs  Jerrie Gathers, DO 02/26/2024, 8:14 AM  PGY-1, [facility]  Medicine FPTS Intern pager: [phone], text pages welcome Secure chat group [facility] Teaching Service

## 2024-02-26 NOTE — Assessment & Plan Note (Signed)
 BPH - continue home tamsulosin  0.4 mg daily

## 2024-02-27 DIAGNOSIS — I480 Paroxysmal atrial fibrillation: Secondary | ICD-10-CM

## 2024-02-27 DIAGNOSIS — R0789 Other chest pain: Secondary | ICD-10-CM | POA: Diagnosis not present

## 2024-02-27 DIAGNOSIS — K861 Other chronic pancreatitis: Secondary | ICD-10-CM | POA: Diagnosis not present

## 2024-02-27 LAB — BASIC METABOLIC PANEL WITH GFR
Anion gap: 9 (ref 5–15)
BUN: <5 mg/dL — ABNORMAL LOW (ref 6–20)
CO2: 17 mmol/L — ABNORMAL LOW (ref 22–32)
Calcium: 7.6 mg/dL — ABNORMAL LOW (ref 8.9–10.3)
Chloride: 107 mmol/L (ref 98–111)
Creatinine, Ser: 0.73 mg/dL (ref 0.61–1.24)
GFR, Estimated: >60 mL/min (ref >60)
Glucose, Bld: 163 mg/dL — ABNORMAL HIGH (ref 70–99)
Potassium: 3.5 mmol/L (ref 3.5–5.1)
Sodium: 133 mmol/L — ABNORMAL LOW (ref 135–145)

## 2024-02-27 LAB — CBC
HCT: 25.5 % — ABNORMAL LOW (ref 39.0–52.0)
Hemoglobin: 8.2 g/dL — ABNORMAL LOW (ref 13.0–17.0)
MCH: 30.6 pg (ref 26.0–34.0)
MCHC: 32.2 g/dL (ref 30.0–36.0)
MCV: 95.1 fL (ref 80.0–100.0)
Platelets: 141 K/uL — ABNORMAL LOW (ref 150–400)
RBC: 2.68 MIL/uL — ABNORMAL LOW (ref 4.22–5.81)
RDW: 21.3 % — ABNORMAL HIGH (ref 11.5–15.5)
WBC: 5.6 K/uL (ref 4.0–10.5)
nRBC: 0 % (ref 0.0–0.2)

## 2024-02-27 LAB — TROPONIN I (HIGH SENSITIVITY)
Troponin I (High Sensitivity): 106 ng/L (ref <18)
Troponin I (High Sensitivity): 92 ng/L — ABNORMAL HIGH (ref <18)

## 2024-02-27 LAB — MAGNESIUM
Magnesium: 1 mg/dL — ABNORMAL LOW (ref 1.7–2.4)
Magnesium: 2.1 mg/dL (ref 1.7–2.4)

## 2024-02-27 MED ORDER — POTASSIUM CHLORIDE 20 MEQ PO PACK
60.0000 meq | PACK | Freq: Once | ORAL | Status: AC
  Administered 2024-02-27: 60 meq via ORAL
  Filled 2024-02-27: qty 3

## 2024-02-27 MED ORDER — INFLUENZA VIRUS VACC SPLIT PF (FLUZONE) 0.5 ML IM SUSY: 0.5000 mL | PREFILLED_SYRINGE | INTRAMUSCULAR | Status: DC

## 2024-02-27 MED ORDER — ENSURE PLUS HIGH PROTEIN PO LIQD
237.0000 mL | Freq: Three times a day (TID) | ORAL | Status: DC
  Administered 2024-02-27 – 2024-03-01 (×5): 237 mL via ORAL

## 2024-02-27 MED ORDER — LOPERAMIDE HCL 2 MG PO CAPS
2.0000 mg | ORAL_CAPSULE | Freq: Once | ORAL | Status: AC
Start: 2024-02-27 — End: 2024-02-27
  Administered 2024-02-27: 2 mg via ORAL
  Filled 2024-02-27: qty 1

## 2024-02-27 MED ORDER — MAGNESIUM SULFATE 4 GM/100ML IV SOLN
4.0000 g | Freq: Once | INTRAVENOUS | Status: AC
  Administered 2024-02-27: 4 g via INTRAVENOUS
  Filled 2024-02-27: qty 100

## 2024-02-27 MED ORDER — MAGNESIUM SULFATE 2 GM/50ML IV SOLN
2.0000 g | Freq: Once | INTRAVENOUS | Status: AC
  Administered 2024-02-27: 2 g via INTRAVENOUS
  Filled 2024-02-27: qty 50

## 2024-02-27 MED ORDER — OXYCODONE HCL 5 MG PO TABS
10.0000 mg | ORAL_TABLET | ORAL | Status: DC | PRN
  Administered 2024-02-27 – 2024-03-01 (×18): 10 mg via ORAL
  Filled 2024-02-27 (×18): qty 2

## 2024-02-27 MED ORDER — MELATONIN 3 MG PO TABS
3.0000 mg | ORAL_TABLET | Freq: Once | ORAL | Status: AC
  Administered 2024-02-27: 3 mg via ORAL
  Filled 2024-02-27: qty 1

## 2024-02-27 MED ORDER — HYDROMORPHONE HCL 1 MG/ML IJ SOLN
0.5000 mg | Freq: Once | INTRAMUSCULAR | Status: AC
  Administered 2024-02-27: 0.5 mg via INTRAVENOUS
  Filled 2024-02-27: qty 0.5

## 2024-02-27 NOTE — Assessment & Plan Note (Signed)
 Stable. CIWAs discontinued - Continue Nicotine  patch and nicotine  gum - Continue Thiamine  100 mg daily, Folic acid  1 mg daily, and Multivitamin with minerals daily

## 2024-02-27 NOTE — Assessment & Plan Note (Addendum)
 Mg 1.0, K 3.5. Low Mg likely 2/2 chronic etoh use and malabsorption. Ideally would like patient to be stable on PO Mg alone. Will try to replete while admitted. - Repleted with IV Magnesium  8g an dKcl 60meq - Continue home Magnesium  oxide 400mg  daily - AM BMP, Mg - PM Mg, replete as needed

## 2024-02-27 NOTE — Progress Notes (Signed)
 Patient informed nursing of 10/10 abdominal pain and requested for IV dilaudid . Patient does not have orders for IV dilaudid . RN notified MD team. MD team to round and assess patient. Patient made aware.

## 2024-02-27 NOTE — Assessment & Plan Note (Signed)
 HR at goal. - Cardiology consulted, appreciate recommendations (s/o) - Goal HR < 115 - Continue Diltiazem 24hr 240mg  BID - Continue Carvedilol  25mg  BID

## 2024-02-27 NOTE — Progress Notes (Signed)
 Rounding Note   Patient Name: [name] Date of Encounter: 02/27/2024  [facility] Cardiologist: Lynwood Schilling, MD   Subjective Asked to see [name] because of atypical chest pain.  Scheduled Meds:  acetaminophen   1,000 mg Oral Q6H   carvedilol   25 mg Oral BID WC   diltiazem  240 mg Oral BID   enoxaparin  (LOVENOX ) injection  40 mg Subcutaneous Q24H   famotidine   40 mg Oral Daily   feeding supplement  1 Container Oral TID BM   feeding supplement  237 mL Oral TID BM   ferrous sulfate   325 mg Oral Q breakfast   folic acid   1 mg Oral Daily   lidocaine   2 patch Transdermal Q24H   lipase/protease/amylase  108,000 Units Oral TID WC   magnesium  oxide  400 mg Oral Daily   multivitamin with minerals  1 tablet Oral Daily   nicotine   21 mg Transdermal Daily   pantoprazole   40 mg Oral Daily   sucralfate   1 g Oral TID WC & HS   tamsulosin   0.4 mg Oral Daily   thiamine   100 mg Oral Daily   Or   thiamine   100 mg Intravenous Daily   Continuous Infusions:  PRN Meds: calcium  carbonate, lipase/protease/amylase **AND** lipase/protease/amylase, nicotine  polacrilex, ondansetron  (ZOFRAN ) IV, oxyCODONE    Vital Signs  Vitals:   02/26/24 2323 02/27/24 0405 02/27/24 0800 02/27/24 1220  BP: 108/76 (!) 105/47  102/71  Pulse: 95 85 71 67  Resp: 20 20 18 16   Temp: 98 F (36.7 C) 98.2 F (36.8 C) 98.3 F (36.8 C) 98.5 F (36.9 C)  TempSrc: Oral Oral Oral Oral  SpO2: 99% 95% 100% 100%  Weight:      Height:        Intake/Output Summary (Last 24 hours) at 02/27/2024 1621 Last data filed at 02/27/2024 1532 Gross per 24 hour  Intake 717 ml  Output --  Net 717 ml      02/20/2024    1:59 PM 02/04/2024   12:39 PM 01/20/2024   12:25 AM  Last 3 Weights  Weight (lbs) 118 lb 118 lb 123 lb 7.3 oz  Weight (kg) 53.524 kg 53.524 kg 56 kg      Telemetry Sinus rhythm- Personally Reviewed  ECG  Normal sinus rhythm at 61 with nonspecific ST and T wave changes-  Personally Reviewed  Physical Exam  GEN: No acute distress.   Neck: No JVD Cardiac: RRR, no murmurs, rubs, or gallops.  Respiratory: Clear to auscultation bilaterally. GI: Soft, nontender, non-distended  MS: No edema; No deformity. Neuro:  Nonfocal  Psych: Normal affect   Labs High Sensitivity Troponin:   Recent Labs  Lab 02/20/24 1418 02/20/24 2125 02/21/24 0239 02/27/24 1206 02/27/24 1410  TROPONINIHS 5 9 7  106* 92*     Chemistry Recent Labs  Lab 02/21/24 0239 02/22/24 0351 02/23/24 0337 02/25/24 0342 02/26/24 0328 02/27/24 0312 02/27/24 1206  NA 130* 136   < > 134* 135 133*  --   K 4.6 3.5   < > 4.6 5.0 3.5  --   CL 107 111   < > 109 106 107  --   CO2 12* 16*   < > 18* 21* 17*  --   GLUCOSE 154* 144*   < > 52* 97 163*  --   BUN 6 <5*   < > <5* 9 <5*  --   CREATININE 0.81 0.72   < > 0.67 0.85 0.73  --  CALCIUM  7.1* 7.1*   < > 8.0* 8.8* 7.6*  --   MG 2.0 1.2*   < > 1.1* 1.0* 1.0* 2.1  PROT 6.4* 5.5*  --   --   --   --   --   ALBUMIN  3.3* 2.9*  --   --   --   --   --   AST 57* 30  --   --   --   --   --   ALT 28 23  --   --   --   --   --   ALKPHOS 82 65  --   --   --   --   --   BILITOT 0.9 0.4  --   --   --   --   --   GFRNONAA >60 >60   < > >60 >60 >60  --   ANIONGAP 11 9   < > 7 8 9   --    < > = values in this interval not displayed.    Lipids No results for input(s): CHOL, TRIG, HDL, LABVLDL, LDLCALC, CHOLHDL in the last 168 hours.  Hematology Recent Labs  Lab 02/25/24 1412 02/26/24 0328 02/27/24 0312  WBC 6.1 6.0 5.6  RBC 2.68* 2.57* 2.68*  HGB 8.5* 8.0* 8.2*  HCT 26.5* 24.6* 25.5*  MCV 98.9 95.7 95.1  MCH 31.7 31.1 30.6  MCHC 32.1 32.5 32.2  RDW 21.9* 21.5* 21.3*  PLT 149* 145* 141*   Thyroid   Recent Labs  Lab 02/20/24 2125  TSH 0.913  FREET4 0.74    BNPNo results for input(s): BNP, PROBNP in the last 168 hours.  DDimer No results for input(s): DDIMER in the last 168 hours.   Radiology  No results  found.  Cardiac Studies None  Patient Profile   53 y.o. 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 Gennaro.   Assessment & Plan  1: A-fib-history of WPW with ablation remotely.  Patient was admitted with A-fib with RVR.  He ultimately converted to sinus rhythm which she is in today.  He is not a candidate for oral anticoagulation because of noncompliance.  He does have a history of pancreatitis and excessive alcohol  use as well as cocaine use.  At this point, other than recommending cessation of alcohol  no other recommendations other than carvedilol  which she is already on at max dose.  2: Chest pain-patient has been complaining of chest pain.  Is fairly atypical.  He has normal 2D echo this past July.  The pain is fleeting, sharp but also has a pressure component.  His troponins are mildly elevated at 100 and flat.  Will get a Lexiscan  Myoview  to restratify.   For questions or updates, please contact [facility] Please consult [email or web address] for contact info under       Signed, Dorn Lesches, MD  02/27/2024, 4:21 PM

## 2024-02-27 NOTE — Progress Notes (Signed)
 Mobility Specialist Progress Note:   02/27/24 1150  Mobility  Activity Ambulated independently  Level of Assistance Independent  Assistive Device None  Distance Ambulated (ft) 500 ft  Activity Response Tolerated well  Mobility Referral Yes  Mobility visit 1 Mobility  Mobility Specialist Start Time (ACUTE ONLY) 1150  Mobility Specialist Stop Time (ACUTE ONLY) 1200  Mobility Specialist Time Calculation (min) (ACUTE ONLY) 10 min   Pt able to ambulate at an independent level with no AD. Grimacing and guarding his stomach throughout, c/o 10/10 LLQ pain. RN and MD aware. Pt back in room with all needs met.  Therisa Rana Mobility Specialist Please contact via SecureChat or  Rehab office at [phone]

## 2024-02-27 NOTE — Assessment & Plan Note (Addendum)
 Acute on chronic. Still in pain. Improved yesterday with IV dilaudid . Patient provided education on low-fat, low-grease diet to minimize pain. Will advance diet as tolerated. - Continue Creon  108,000 units TID with meals and PRN with snacks - Continue daily regimen: Multivitamin with minerals, Pepcid  40mg , Protonix  40mg , Sucralfate  1g TID, Tums 400mg  TID PRN - Pain regimen: - Try K-pad on back, lidoderm  patch to chest/abdomen - Tylenol  1000 mg q6hrs scheduled - Increasing Oxycodone  from 7.5mg  to 10mg  q4hrs PRN - GI consulted, appreciate recommendations - Pseudocyst too small to drain, no further interventions - AM BMP, Mg, CBC

## 2024-02-27 NOTE — Assessment & Plan Note (Addendum)
 Hgb 8.2. Anemia panel indicates borderline iron deficiency anemia. Normal folate and vitamin B12. - Transfusion threshold Hgb < 7 - AM CBC - Start PO iroin 325 mg daily

## 2024-02-27 NOTE — Progress Notes (Signed)
 Initial Nutrition Assessment  DOCUMENTATION CODES:   Severe malnutrition in context of chronic illness  INTERVENTION:  Ensure Plus High Protein po TID, each supplement provides 350 kcal and 20 grams of protein. Coupons provided for Ensure at patient's request.  Patient provided with Pancreatitis Nutrition Therapy handout with extensive discussion regarding fat restriction, hidden fats in foods, and small, frequent portion sizes.  Advance diet as tolerated to regular diet.   NUTRITION DIAGNOSIS:   Severe Malnutrition related to chronic illness as evidenced by percent weight loss, severe muscle depletion, moderate fat depletion.   GOAL:   Patient will meet greater than or equal to 90% of their needs, Weight gain   MONITOR:   PO intake, Supplement acceptance, Diet advancement  REASON FOR ASSESSMENT:   LOS, NPO/Clear Liquid Diet    ASSESSMENT:   PMH significant for pAfib, Wolff-Parkinson-White (WPW) Syndrome s/p ablation, chronic pancreatitis with pancreatic cyst, polysubstance use disorder, anxiety/depression, 1PPD cigarette smoking, and chronic bronchitis. Presented with Afib 2/2 chronic ETOH use and dehydration, chronic pancreatitis, and suspected portal hypertensive gastropathy.  The patient was tolerating advance in diet to regular diet on 10/19 until he ate a cheeseburger and mac&cheese and experienced increased abdominal pain, currently described as 10/10. Diet has since been regressed to liquid diet. GI has signed off - no endoscopic intervention performed this admission. Visited the patient who tells me he is tolerating his clear liquid diet. He would like to switch Boost Breeze to Ensure now that he is on a full-liquid diet. Provided patient with Pancreatitis Nutrition Therapy handout and discussed extensively regarding limiting fats in foods and hidden fats in common foods. Also encouraged the patient to reduce meal sizes for now and eat more frequently. The patient  states his daughter and mother cook at times when he does not. Suggested that he provide his family with the handout and review details with them. He tells me he was eating well up until 1.5 weeks ago. Breakfast typically consists of eggs, bacon, toast and coffee, lunch is cookies/chips with 2% milk, and dinner is fried chicken with greens, rice and pinto beans. His UBW is 130 lbs, which he last weighed 2 months ago. He is typically active and likes playing basketball.   Procedures: EGD/EUS 02/2023   Meal Intake: 75-100% of clear liquid diet meals per patient  Pertinent Medications:  acetaminophen   1,000 mg Oral Q6H   carvedilol   25 mg Oral BID WC   diltiazem  240 mg Oral BID   enoxaparin  (LOVENOX ) injection  40 mg Subcutaneous Q24H   famotidine   40 mg Oral Daily   feeding supplement  1 Container Oral TID BM   ferrous sulfate   325 mg Oral Q breakfast   folic acid   1 mg Oral Daily   lidocaine   2 patch Transdermal Q24H   lipase/protease/amylase  108,000 Units Oral TID WC   loperamide   2 mg Oral Once   magnesium  oxide  400 mg Oral Daily   multivitamin with minerals  1 tablet Oral Daily   nicotine   21 mg Transdermal Daily   pantoprazole   40 mg Oral Daily   sucralfate   1 g Oral TID WC & HS   tamsulosin   0.4 mg Oral Daily   thiamine   100 mg Oral Daily   Or   thiamine   100 mg Intravenous Daily   Labs:     I/O: +4 L since admit    NUTRITION - FOCUSED PHYSICAL EXAM:  Flowsheet Row Most Recent Value  Orbital Region  No depletion  Upper Arm Region Moderate depletion  Thoracic and Lumbar Region Moderate depletion  Buccal Region Moderate depletion  Temple Region Severe depletion  Clavicle Bone Region Mild depletion  Clavicle and Acromion Bone Region Mild depletion  Scapular Bone Region Mild depletion  Dorsal Hand No depletion  Patellar Region Mild depletion  Anterior Thigh Region Severe depletion  Posterior Calf Region Severe depletion  Edema (RD Assessment) None  Hair Reviewed   Eyes Reviewed  Mouth Reviewed  Skin Reviewed  Nails Reviewed     Diet Order             Diet full liquid Room service appropriate? Yes; Fluid consistency: Thin  Diet effective now                 Regular diet last on 10/19 Boost Breeze TID  EDUCATION NEEDS:   Education needs have been addressed  Skin:  Skin Assessment: Reviewed RN Assessment  Last BM:  10/21 Type 7 liquid  Height:   Ht Readings from Last 1 Encounters:  02/20/24 5' 8 (1.727 m)    Weight:   3 Kg (6%) loss in 1 month, 6 Kg (9%) loss in 2 months  Ideal Body Weight:  68 kg  BMI:  Body mass index is 17.94 kg/m.  Estimated Nutritional Needs:   Kcal:  1900-2100  Protein:  80-100  Fluid:  1900-2100    Leverne Ruth, MS, RDN, LDN [place]. [facility] See AMION for contact information

## 2024-02-27 NOTE — Assessment & Plan Note (Signed)
 BPH - continue home tamsulosin  0.4 mg daily

## 2024-02-27 NOTE — Progress Notes (Signed)
 Daily Progress Note Intern Pager: [phone]  Patient name: [name] Medical record number: [id number] Date of birth: 1970-08-07 Age: 53 y.o. Gender: male  Primary Care Provider: Myra Wanda LABOR, MD Consultants:  Cardiology (s/o), GI (s/o)  Code Status: Full code  Pt Overview and Major Events to Date:  10/14 - Admitted   Assessment and Plan:  [name] is a 53 y.o. male with a PMH of paroxysmal A-fib, WPW s/p ablation, chronic pancreatitis, polysubstance use disorder, anxiety/depression, chronic bronchitis admitted for A-fib with RVR 2/2 chronic EtOH use and dehydration and chronic pancreatitis. Now rate controlled with oral diltiazem. Pain control and ability to tolerate diet continue to be an issue.  Assessment & Plan Chronic pancreatitis (HCC) Abdominal pain Acute on chronic. Still in pain. Improved yesterday with IV dilaudid . Patient provided education on low-fat, low-grease diet to minimize pain. Will advance diet as tolerated. - Continue Creon  108,000 units TID with meals and PRN with snacks - Continue daily regimen: Multivitamin with minerals, Pepcid  40mg , Protonix  40mg , Sucralfate  1g TID, Tums 400mg  TID PRN - Pain regimen: - Try K-pad on back, lidoderm  patch to chest/abdomen - Tylenol  1000 mg q6hrs scheduled - Increasing Oxycodone  from 7.5mg  to 10mg  q4hrs PRN - GI consulted, appreciate recommendations - Pseudocyst too small to drain, no further interventions - AM BMP, Mg, CBC Hypomagnesemia Hypokalemia Mg 1.0, K 3.5. Low Mg likely 2/2 chronic etoh use and malabsorption. Ideally would like patient to be stable on PO Mg alone. Will try to replete while admitted. - Repleted with IV Magnesium  8g an dKcl 60meq - Continue home Magnesium  oxide 400mg  daily - AM BMP, Mg - PM Mg, replete as needed Anemia Hgb 8.2. Anemia panel indicates borderline iron deficiency anemia. Normal folate and vitamin B12. - Transfusion threshold Hgb < 7 - AM CBC -  Start PO iroin 325 mg daily Atrial fibrillation with RVR (HCC) HR at goal. - Cardiology consulted, appreciate recommendations (s/o) - Goal HR < 115 - Continue Diltiazem 24hr 240mg  BID - Continue Carvedilol  25mg  BID Substance use disorder Stable. CIWAs discontinued - Continue Nicotine  patch and nicotine  gum - Continue Thiamine  100 mg daily, Folic acid  1 mg daily, and Multivitamin with minerals daily Chronic health problem BPH - continue home tamsulosin  0.4 mg daily  FEN/GI: Full liquid PPx: Lovenox  Dispo:Home pending clinical improvement .   Subjective:  No complaints of pain overnight. Patient reports he feels the same this morning as he did yesterday. Able to sit up comfortably in bed. Requesting IV dilaudid  as it helped yesterday. Open to advancing diet today. Still having diarrhea.  Objective: Temp:  [97.3 F (36.3 C)-98.6 F (37 C)] 98.2 F (36.8 C) (10/21 0405) Pulse Rate:  [85-103] 85 (10/21 0405) Resp:  [18-20] 20 (10/21 0405) BP: (105-140)/(47-109) 105/47 (10/21 0405) SpO2:  [95 %-100 %] 95 % (10/21 0405)  Physical Exam: General: Alert, in NAD.  Cardiovascular: Irregular rate, no m/r/g appreciated Pulmonary: Normal WOB. CTAB with no w/c/r present Abdomen: Soft, TTP in LUQ, non-distended. Neurologic: Moves all four extremities appropriately  Laboratory: Most recent CBC Lab Results  Component Value Date   WBC 5.6 02/27/2024   HGB 8.2 (L) 02/27/2024   HCT 25.5 (L) 02/27/2024   MCV 95.1 02/27/2024   PLT 141 (L) 02/27/2024   Most recent BMP    Latest Ref Rng & Units 02/27/2024    3:12 AM  BMP  Glucose 70 - 99 mg/dL 836   BUN 6 - 20 mg/dL <5  Creatinine 0.61 - 1.24 mg/dL 9.26   Sodium 864 - 854 mmol/L 133   Potassium 3.5 - 5.1 mmol/L 3.5   Chloride 98 - 111 mmol/L 107   CO2 22 - 32 mmol/L 17   Calcium  8.9 - 10.3 mg/dL 7.6     Imaging/Diagnostic Tests: No new imaging  Jerrie Gathers, DO 02/27/2024, 7:32 AM  PGY-1, [facility] Family  Medicine FPTS Intern pager: [phone], text pages welcome Secure chat group [facility] Teaching Service

## 2024-02-28 ENCOUNTER — Inpatient Hospital Stay (HOSPITAL_COMMUNITY): Payer: MEDICAID

## 2024-02-28 DIAGNOSIS — R079 Chest pain, unspecified: Secondary | ICD-10-CM

## 2024-02-28 DIAGNOSIS — I4891 Unspecified atrial fibrillation: Secondary | ICD-10-CM | POA: Diagnosis not present

## 2024-02-28 DIAGNOSIS — K861 Other chronic pancreatitis: Secondary | ICD-10-CM | POA: Diagnosis not present

## 2024-02-28 LAB — CBC
HCT: 28.2 % — ABNORMAL LOW (ref 39.0–52.0)
Hemoglobin: 9.1 g/dL — ABNORMAL LOW (ref 13.0–17.0)
MCH: 31.2 pg (ref 26.0–34.0)
MCHC: 32.3 g/dL (ref 30.0–36.0)
MCV: 96.6 fL (ref 80.0–100.0)
Platelets: 156 K/uL (ref 150–400)
RBC: 2.92 MIL/uL — ABNORMAL LOW (ref 4.22–5.81)
RDW: 21.3 % — ABNORMAL HIGH (ref 11.5–15.5)
WBC: 6.3 K/uL (ref 4.0–10.5)
nRBC: 0 % (ref 0.0–0.2)

## 2024-02-28 LAB — NM MYOCAR MULTI W/SPECT W/WALL MOTION / EF
Nuc Stress EF: 69 %
Peak HR: 84 {beats}/min
Rest HR: 56 {beats}/min
Rest Nuclear Isotope Dose: 11 mCi
ST Depression (mm): 0 mm
Stress Nuclear Isotope Dose: 31.2 mCi

## 2024-02-28 LAB — BASIC METABOLIC PANEL WITH GFR
Anion gap: 9 (ref 5–15)
BUN: <5 mg/dL — ABNORMAL LOW (ref 6–20)
CO2: 19 mmol/L — ABNORMAL LOW (ref 22–32)
Calcium: 8.5 mg/dL — ABNORMAL LOW (ref 8.9–10.3)
Chloride: 106 mmol/L (ref 98–111)
Creatinine, Ser: 0.68 mg/dL (ref 0.61–1.24)
GFR, Estimated: >60 mL/min (ref >60)
Glucose, Bld: 87 mg/dL (ref 70–99)
Potassium: 4.6 mmol/L (ref 3.5–5.1)
Sodium: 134 mmol/L — ABNORMAL LOW (ref 135–145)

## 2024-02-28 LAB — MAGNESIUM: Magnesium: 1.7 mg/dL (ref 1.7–2.4)

## 2024-02-28 MED ORDER — TECHNETIUM TC 99M TETROFOSMIN IV KIT
30.0000 | PACK | Freq: Once | INTRAVENOUS | Status: AC | PRN
Start: 2024-02-28 — End: 2024-02-28
  Administered 2024-02-28: 31.2 via INTRAVENOUS

## 2024-02-28 MED ORDER — HYDROMORPHONE HCL 1 MG/ML IJ SOLN
0.5000 mg | Freq: Once | INTRAMUSCULAR | Status: AC
  Administered 2024-02-28: 0.5 mg via INTRAVENOUS
  Filled 2024-02-28: qty 0.5

## 2024-02-28 MED ORDER — MAGNESIUM SULFATE 2 GM/50ML IV SOLN
2.0000 g | Freq: Once | INTRAVENOUS | Status: AC
Start: 2024-02-28 — End: 2024-02-28
  Administered 2024-02-28: 2 g via INTRAVENOUS
  Filled 2024-02-28: qty 50

## 2024-02-28 MED ORDER — MELATONIN 3 MG PO TABS
3.0000 mg | ORAL_TABLET | Freq: Once | ORAL | Status: AC
  Administered 2024-02-28: 3 mg via ORAL
  Filled 2024-02-28: qty 1

## 2024-02-28 MED ORDER — MELATONIN 3 MG PO TABS: 3.0000 mg | ORAL_TABLET | Freq: Once | ORAL | Status: DC

## 2024-02-28 MED ORDER — REGADENOSON 0.4 MG/5ML IV SOLN
INTRAVENOUS | Status: AC
  Filled 2024-02-28: qty 5

## 2024-02-28 MED ORDER — TECHNETIUM TC 99M TETROFOSMIN IV KIT
10.0000 | PACK | Freq: Once | INTRAVENOUS | Status: AC | PRN
  Administered 2024-02-28: 11 via INTRAVENOUS

## 2024-02-28 MED ORDER — MAGNESIUM SULFATE 2 GM/50ML IV SOLN
2.0000 g | Freq: Once | INTRAVENOUS | Status: AC
  Administered 2024-02-28: 2 g via INTRAVENOUS
  Filled 2024-02-28: qty 50

## 2024-02-28 MED ORDER — REGADENOSON 0.4 MG/5ML IV SOLN
0.4000 mg | Freq: Once | INTRAVENOUS | Status: AC
  Administered 2024-02-28: 0.4 mg via INTRAVENOUS
  Filled 2024-02-28: qty 5

## 2024-02-28 NOTE — Assessment & Plan Note (Addendum)
 Acute on chronic. Continues to complain of pain but overall tolerating well. Plan to advance diet after Myoview  today.  - Continue Creon  108,000 units TID with meals and PRN with snacks - Continue daily regimen: Multivitamin with minerals, Pepcid  40mg , Protonix  40mg , Sucralfate  1g TID, Tums 400mg  TID PRN - Pain regimen: - Try K-pad on back, lidoderm  patch to chest/abdomen - Tylenol  1000 mg q6hrs scheduled - Continue Oxycodone  10mg  q4hrs PRN - IV Dilaudid  0.5mg  x1 - GI consulted, appreciate recommendations - Pseudocyst too small to drain, no further interventions - AM BMP, Mg

## 2024-02-28 NOTE — Assessment & Plan Note (Signed)
 BPH - continue home tamsulosin  0.4 mg daily

## 2024-02-28 NOTE — Progress Notes (Addendum)
     [name] presented for a Lexiscan  nuclear stress test today.  I Raphael LOISE Bring, PA-C, provided direct supervision and was present during the stress portion of the study today, which was completed without significant symptoms, immediate complications, or acute ST/T changes on ECG. Patient did have 1 jolly rancher prior to test - reviewed with Dr. Okey who gave OK to proceed.  Note that the blood pressure cuff that was attached to the nuc med computer was not functioning accurately but still produced readings even though not hooked up to patient that transmitted to his actual report. We followed his blood pressure with a different cuff, confirmed to correlate manually, which tech hand-wrote on tracings:  Starting BP 108/58 F/u BP 111/87 F/u BP 112/84 F/u BP 109/68  Stress imaging is pending at this time.  Preliminary ECG findings may be listed in the chart, but the stress test result will not be finalized until perfusion imaging is complete.  Raphael LOISE Bring, PA-C  02/28/2024, 3:28 PM   Addendum: nuclear stress test was normal. Relayed findings to Dr. Okey, primary attending and asked nurse to let pt know.

## 2024-02-28 NOTE — Assessment & Plan Note (Addendum)
 Mg 1.7. Repeat PM Mg 2.1 which is improved. K 4.6. Low Mg likely 2/2 chronic etoh use and malabsorption. Ideally would like patient to be stable on PO Mg alone - Repleted with IV Magnesium  4g - Continue home Magnesium  oxide 400mg  daily - AM BMP, Mg

## 2024-02-28 NOTE — Progress Notes (Signed)
    Lexisan myoview   No ischemia    WIll sign off    Pt has appt in clinic for follow up   Vina Gull MD

## 2024-02-28 NOTE — Assessment & Plan Note (Addendum)
 Patient reported CP yesterday. EKG with  NSR and troponins slightly elevated but flat. - Cardiology consulted, appreciate recommendations - Lexiscan  Myoview  today to risk stratify

## 2024-02-28 NOTE — Progress Notes (Signed)
 Daily Progress Note Intern Pager: [phone]  Patient name: [name] Medical record number: [id number] Date of birth: Feb 06, 1971 Age: 53 y.o. Gender: male  Primary Care Provider: Myra Wanda LABOR, MD Consultants: Cardiology (s/o), GI (s/o)  Code Status: Full code  Pt Overview and Major Events to Date:  10/14 - Admitted   Assessment and Plan:  [name] is a 53 y.o. male with a PMH of paroxysmal A-fib, WPW s/p ablation, chronic pancreatitis, polysubstance use disorder, anxiety/depression, chronic bronchitis admitted for A-fib with RVR 2/2 chronic EtOH use and dehydration and chronic pancreatitis. Now rate controlled with oral diltiazem. Pain control and ability to tolerate diet continue to be an issue. Further work up for chest pain today.  Assessment & Plan Chronic pancreatitis (HCC) Abdominal pain Acute on chronic. Continues to complain of pain but overall tolerating well. Plan to advance diet after Myoview  today.  - Continue Creon  108,000 units TID with meals and PRN with snacks - Continue daily regimen: Multivitamin with minerals, Pepcid  40mg , Protonix  40mg , Sucralfate  1g TID, Tums 400mg  TID PRN - Pain regimen: - Try K-pad on back, lidoderm  patch to chest/abdomen - Tylenol  1000 mg q6hrs scheduled - Continue Oxycodone  10mg  q4hrs PRN - IV Dilaudid  0.5mg  x1 - GI consulted, appreciate recommendations - Pseudocyst too small to drain, no further interventions - AM BMP, Mg Chest pain Patient reported CP yesterday. EKG with  NSR and troponins slightly elevated but flat. - Cardiology consulted, appreciate recommendations - Lexiscan  Myoview  today to risk stratify Hypomagnesemia Hypokalemia Mg 1.7. Repeat PM Mg 2.1 which is improved. K 4.6. Low Mg likely 2/2 chronic etoh use and malabsorption. Ideally would like patient to be stable on PO Mg alone - Repleted with IV Magnesium  4g - Continue home Magnesium  oxide 400mg  daily - AM BMP, Mg Anemia Hgb 9.1.  Anemia panel indicates borderline iron deficiency anemia.  - Transfusion threshold Hgb < 7 - Continue PO iron 325 mg daily - Lab holiday given  Hgb remains stable Atrial fibrillation with RVR (HCC) HR at goal. - Cardiology consulted, appreciate recommendations (s/o) - Goal HR < 115 - Continue Diltiazem 24hr 240mg  BID - Continue Carvedilol  25mg  BID Substance use disorder Stable. - Continue Nicotine  patch and nicotine  gum - Continue Thiamine  100 mg daily, Folic acid  1 mg daily, and Multivitamin with minerals daily Chronic health problem BPH - continue home tamsulosin  0.4 mg daily   FEN/GI: NPO, sips with meds for precedure; plan to transition to regular diet after procedure PPx: Lovenox  Dispo:Home pending clinical improvement .  Subjective:  Received nursing report that patient c/o of midsternal pain and eating ice despite NPO status.   Patient resting in bed at the time of my exam. Reports epigastric to midsternal pain that feels like palpitations. Asking when Myoview  will be later today as he is NPO would like some water . I explained he must remain NPO for the procedure and cardiology will let us  know when the procedure is scheduled for. Patient also requesting next oxycodone  dose 1 hr early.  Objective: Temp:  [97.8 F (36.6 C)-98.5 F (36.9 C)] 97.9 F (36.6 C) (10/22 0448) Pulse Rate:  [47-83] 47 (10/22 0448) Resp:  [14-19] 15 (10/22 0448) BP: (98-116)/(63-83) 111/80 (10/22 0448) SpO2:  [98 %-100 %] 98 % (10/22 0448)  Physical Exam: General: Alert, in NAD.  Cardiovascular: RRR, no m/r/g appreciated. Pulmonary: Normal WOB. CTAB with no w/c/r present. Abdomen: Soft, TTP in LUQ, non-distended. Extremities: No cyanosis or edema. Neurologic: Normal gait,  moves all four extremities appropriately  Laboratory: Most recent CBC Lab Results  Component Value Date   WBC 6.3 02/28/2024   HGB 9.1 (L) 02/28/2024   HCT 28.2 (L) 02/28/2024   MCV 96.6 02/28/2024   PLT 156  02/28/2024   Most recent BMP    Latest Ref Rng & Units 02/28/2024    3:47 AM  BMP  Glucose 70 - 99 mg/dL 87   BUN 6 - 20 mg/dL <5   Creatinine 9.38 - 1.24 mg/dL 9.31   Sodium 864 - 854 mmol/L 134   Potassium 3.5 - 5.1 mmol/L 4.6   Chloride 98 - 111 mmol/L 106   CO2 22 - 32 mmol/L 19   Calcium  8.9 - 10.3 mg/dL 8.5     Other pertinent labs: Troponin 106 > 92  Imaging/Diagnostic Tests: EKG: NSR w/ nonspecific ST/T wave changes  Jason Gathers, DO 02/28/2024, 8:08 AM  PGY-1, [facility] Family Medicine FPTS Intern pager: [phone], text pages welcome Secure chat group [facility] Teaching Service

## 2024-02-28 NOTE — TOC Progression Note (Incomplete)
 Transition of Care [facility]) - Progression Note    Patient Details  Name: [name] MRN: [id number] Date of Birth: 1971/04/18  Transition of Care [facility]) CM/SW Contact  Montie LOISE [place], [place] Phone Number: 02/28/2024, 10:24 AM  Clinical Narrative:     CSW acknowledge consult for substance abuse education/counseling- SA resources were added to the AVS.                     Expected Discharge Plan and Services                                               Social Drivers of Health (SDOH) Interventions SDOH Screenings   Food Insecurity: No Food Insecurity (02/21/2024)  Housing: Low Risk  (02/21/2024)  Recent Concern: Housing - High Risk (11/23/2023)  Transportation Needs: No Transportation Needs (02/21/2024)  Recent Concern: Transportation Needs - Unmet Transportation Needs (11/23/2023)  Utilities: Not At Risk (02/21/2024)  Alcohol  Screen: Medium Risk (03/14/2017)  Depression (PHQ2-9): Medium Risk (03/27/2020)  Social Connections: Socially Isolated (11/10/2023)  Tobacco Use: High Risk (02/20/2024)    Readmission Risk Interventions    06/19/2023    4:11 PM 03/07/2023    4:21 PM 01/10/2023    4:32 PM  Readmission Risk Prevention Plan  Transportation Screening Complete Complete Complete  Medication Review (RN Care Manager) Referral to Pharmacy Complete Complete  PCP or Specialist appointment within 3-5 days of discharge Complete Complete Complete  HRI or Home Care Consult Complete Complete Complete  SW Recovery Care/Counseling Consult Complete Complete Complete  Palliative Care Screening Not Applicable Not Applicable Not Applicable  Skilled Nursing Facility Not Applicable Not Applicable Not Applicable

## 2024-02-28 NOTE — Assessment & Plan Note (Signed)
 HR at goal. - Cardiology consulted, appreciate recommendations (s/o) - Goal HR < 115 - Continue Diltiazem 24hr 240mg  BID - Continue Carvedilol  25mg  BID

## 2024-02-28 NOTE — Assessment & Plan Note (Signed)
 Stable. - Continue Nicotine  patch and nicotine  gum - Continue Thiamine  100 mg daily, Folic acid  1 mg daily, and Multivitamin with minerals daily

## 2024-02-28 NOTE — Assessment & Plan Note (Addendum)
 Hgb 9.1. Anemia panel indicates borderline iron deficiency anemia.  - Transfusion threshold Hgb < 7 - Continue PO iron 325 mg daily - Lab holiday given  Hgb remains stable

## 2024-02-28 NOTE — Discharge Instructions (Signed)
 In a time of Crisis: Integrated family Services/Therapeutic Alternatives.  Mobile Crisis Management provides immediate crisis response, 24/7.  Call [phone] Depending on the county. [facility] for MH/DD/SA [facility] is available 24 hours a day, 7 days a week. Customer Service Specialists will assist you to find a crisis provider that is well-matched with your needs. Your local number is: [phone]  [facility]al Health Urgent Care (BHUC) IOP, individual counseling, medication management [address] [place], [place] [address] [phone] Call for intake hours; Medicaid and Uninsured    Outpatient Providers  [facility] ([facility]) Group and individual counseling. [address]  [place], [place] [address] [phone] [place]: [phone]  [place]: [phone] Medicaid and uninsured.   [facility] Offers IOP groups multiple times per week. [address] [place], [place] [address] [phone] Takes Medicaid and other insurances.   [name]  Chemical Dependency Intensive Outpatient Program (IOP) [address] [place], [place] [address] [phone] Takes Nurse, learning disability and [place].   [facility]  IOP and Partial Hospitalization Program  [address]  [place], [place] [address] [phone] Private Insurance, [place] only for partial hospitalization  [facility] [address], [address], [place], [place] [address] [phone] Monday-Friday. Short and Long term options. [facility]gent Care (BHUC) IOP, individual counseling, medication management [address] [place], [place] [address] [phone] Medicaid and [facility]  [facility] [address]  [place], [place] [address] [phone] Private Insurance and Self Pay   [facility] Outpatient [address]  [place], [place] [address] [phone] Private Insurance, [place], and Self Pay   [facility]one [facility]  [address] [place], [place] [address] [facility]  [address]  [place], [place] [address] [phone]  [facility]  [address] [place], [place] [address] [phone]  [facility]  Locations in [place], [place], [place], [place].  [phone] Accepts all insurances and some uninsured.   Residential Treatment Programs  [facility] [address] [place], [place] [address] [phone] or [phone] Detox and Residential Rehab 21 days (Medicaid, private insurance, and self pay. If Medicare, will look into funding). No methadone. Call for pre-screen.   [facility]) [address]  [place], [place] [address] [phone] Detox 3-7 days (self Pay and Medicaid Limited). Transitional Program for females needs 60 days clean first.  Rehab Only for Males 60 days (Medicare, and Self Pay)-No methadone.  [facility] [address] [place], [place] [address] [phone] or [phone] Private Insurance only  [facility] PHONE: [phone] FAX: [phone] Residential program for women 21 and over for up to a year through a Christian 12-step recovery model. Self-pay.    [facility] [address] [place], [place] [address] Phone:  [phone] Must be detoxed 72 hours prior to admission; 28 day program.  Self-pay.  [facility] [address]  [place], [place] [phone] [employer], Medicare, [place] (not straight [place]). They offer assistance with transportation.   [facility] [address] [place],  [place], [place] [address] [phone] Christian Based Program. Men only. No insurance  [facility] [address]  [place], [place] [address] Women's: [phone] Men's: [phone] No Medicaid.   [facility]  [address]  [place], [place] [address] [phone] Treatment Only, must make assessment appointment, and must be sober for assessment appointment. Self pay, [facility], must be [facility] resident. No methadone.   [facility]  [address] [place], [place] [address] [phone] No pending legal charges, Long-term work program. No methadone. Call for assessment.  [facility]  [address], [place], [place] [address] [phone] or [phone] Commercial Insurance Only  [facility] - [phone] Private Insurance (no [place]). Males/Females, call to make referrals, multiple facilities.   [facility] [address]  [place], [place] [address]  [phone] Men Only Upfront Fee  [facility]-no methadone: [facility] [address] [place], [place] [address] [phone] (f)  [facility] Locations across the [place] (mainly [place] ) willing to help with transportation.  [phone] [employer].     AA Meetings Meeting Locator:  [email or web address] Also can download an app on that website.  Syringe Services Program Due to COVID-19, syringe services programs are likely operating under different hours with limited or no fixed site hours. Some programs may not be operating at all. Please contact the program directly using the phone numbers provided below to see if they are still operating under COVID-19. [facility] Solution to the Opioid Problem (GCSTOP) Fixed; mobile; peer-based [place] [phone] [email or web address] Fixed site exchange at [facility], [address] [place], [place] [address] on Wednesdays (2:00 - 5:00 pm) and Thursdays (4:00 -  8:00 pm). Pop-up mobile exchange locations: [employer] and [employer], [address] [place], [place] [address] on Tuesdays (11:00 am - 1:00 pm) and Fridays (11:00 am - 1:00 pm) -[facility] - [address], [place], [place] [address] on Tuesdays (2:00 - 4:00 pm) and Fridays (2:00 - 4:00 pm) -[facility] -Fixed; mobile; peer-based [phone] [address], [place], [place] [address] Delivery and outreach available in [place] and [place], please call for more information. Monday, Tuesday: 1:00 -7:00 pm, Thursday: 4:00 pm - 8:00 pm, Friday: 1:00 pm - 8:00 pm) Medication-Assisted Treatment (MAT) -[facility]: Locations in [place], [place], [place], [place]. Accepts all insurances and some uninsured. Methadone, Suboxone, etc. Closed on weekends. Walk ins before 11am.  Call [phone]  -[facility]- services [place] and surrounding areas including [place], [place], [place], [place], [address], [address], [place], [place], [place], and [place], [place]. Options include Methadone, buprenorphine or Suboxone. [address], [place], [place] [address] Phone: [phone] [place] - Fri: 5:30am - 2:00pm, Sat: 5:30am -7:30am, Sun: Closed  -[facility]- We use FDA-approved medications, like methadone/suboxone/sublocade, and vivitrol . These medications are then combined with customized care plans that include individual or group counseling, toxicology, and medical care directed by on-site physicians. Accepts most insurance plans, Medicaid, and private pay.  [address] [place], [place] [address] Phone: [phone] Monday-Friday 5:00 AM - 10:00 AM, Saturday 6:00 AM - 8:30 AM, Sunday 6:00 AM - 7:00 AM  -[facility]- [facility] is a treatment & recovery focused program. In addition to receiving methadone medication, our clients participate in individual and group counseling as well as random drug  testing. If accepted into the [facility] Opioid Program, you will be provided several intake appointments and a physical exam [address] [place], [place] [address] Office: [phone]  Fax: [phone]  -[facility]- We put our community members at the  center of everything we do, for remote treatment services as well as in-person, from alcohol  withdrawal to opioid use and more.  [address], [place], [place] [address] [phone] Monday-Wednesday: 9:00am - 5:00pm, Thursday: 9:00am - 6:00pm, Friday: 9:00am - 5:00pm Saturday: 9:00am - 1:00pm, Sunday: Closed  -[facility]- Our clinic in [place], a medication unit, offers daily dosing of methadone or buprenorphine in addition to our clinic in [place] offering counseling to help people overcome addiction to heroin and other opiates. We also offer psychiatric services including medication management, and an office based suboxone program. [address], [place], [place] [address] Phone [phone]  [facility]-We treat Opioid Addiction using medications that are a combination of buprenorphine and naloxone , which are used to treat adults addicted to narcotic painkillers and drugs such as heroin. They reduce the intense cravings and painful symptoms that accompany withdrawal. [address] [place], [place] Phone: [phone]  -[facility] ([place]): $12/daily for Methadone Treatment.  [address], [place], [place] [address] [phone] [address] [place], [place] [address]  -[facility]: Suboxone only.  [address] [place], [place] [address] P. [phone]  -[facility] [address] [place], [place] [address] P. [phone]   Dear Gearld Medford Silvan,   Thank you for letting us  participate in your care! In this section, you will find a brief hospital admission summary of why you were admitted to the  hospital, what happened during your admission, your diagnosis/diagnoses, and recommended follow up.   You were admitted for an rapid heart rate with your atrial fibrillation and given medications via the cardiologist to help.  Please take the diltiazem twice per day and follow-up with the cardiologist upon discharge.  You are also admitted for a flare of your chronic pancreatitis pain, you were given pain medication and advanced your diet over time.  We are sending you with a small amount of oxycodone  to use as needed but please follow-up with [facility] for further evaluation and recommendations   POST-HOSPITAL & CARE INSTRUCTIONS We recommend following up with your PCP within 1 week from being discharged from the hospital. Please let PCP/Specialists know of any changes in medications that were made which you will be able to see in the medications section of this packet. Please also follow up as above  DOCTOR'S APPOINTMENTS & FOLLOW UP Future Appointments  Date Time Provider Department Center  03/21/2024  8:00 AM West, Katlyn D, NP [facility]  04/03/2024  8:00 AM Lavona Agent, MD [facility]     Thank you for choosing [facility]! Take care and be well!  Family Medicine Teaching Service Inpatient Team   [facility]  [address] [place], [place] [address] [phone]

## 2024-02-28 NOTE — Progress Notes (Addendum)
 Progress Note  Patient Name: [name] Date of Encounter: 02/28/2024 [facility] Cardiologist: Lynwood Schilling, MD   Interval Summary    Main complaint is abd pain, says this radiates up into the chest at times. No chest pain currently, has a fluttering sensation at times. Asking about moving up scheduled pain meds.   Vital Signs Vitals:   02/27/24 1956 02/27/24 2236 02/28/24 0448 02/28/24 0841  BP: 103/77 116/83 111/80 139/77  Pulse: 82 83 (!) 47 74  Resp: 19 14 15 20   Temp: 98 F (36.7 C) 97.8 F (36.6 C) 97.9 F (36.6 C)   TempSrc: Oral Oral Oral   SpO2: 100% 100% 98%   Weight:      Height:        Intake/Output Summary (Last 24 hours) at 02/28/2024 0935 Last data filed at 02/28/2024 0853 Gross per 24 hour  Intake 810 ml  Output 825 ml  Net -15 ml      02/20/2024    1:59 PM 02/04/2024   12:39 PM 01/20/2024   12:25 AM  Last 3 Weights  Weight (lbs) 118 lb 118 lb 123 lb 7.3 oz  Weight (kg) 53.524 kg 53.524 kg 56 kg      Telemetry/ECG  Sinus Rhythm - Personally Reviewed  Physical Exam  GEN: No acute distress.   Neck: No JVD Cardiac: RRR, no murmurs, rubs, or gallops.  Respiratory: Clear to auscultation bilaterally. GI: Soft, nontender, non-distended  MS: No edema  Assessment & Plan   53 y.o. 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   Atrial fibrillation with RVR Remote hx of WPW -- initially admitted with atrial fibrillation with RVR, ultimately converted to sinus rhythm and has been maintaining. Not on OAC with compliance and ETOH use -- on coreg  25mg  BID, Diltiazem 240mg  daily  Chest pain Abd pain  Chronic pancreatitis -- seen by Dr.  Court yesterday for complaints of chest pain. Today reports this is more abd pain and continues to have diarrhea -- hsTn 5>>9>>106>>92, EKG yesterday sinus rhythm 61bpm, septal infarct age undetermined -- planned for lexiscan  myoview    Per Primary Polysubstance abuse BPH Noncompliance    For questions or updates, please contact [facility] Please consult [email or web address] for contact info under   Signed, Manuelita Rummer, NP   Patient seen and examined   I agree with findiings as noted by L Rummer above  PT is a 53 yo with hx WPW in past   S/p ablation    Admitted with chest pain  Found to be n afib with RVR   Today pain is more LUQ    Notes occasional fluttering in chest   On exam Lungs are CTA    Cardiac RRR   No S3  No murmurs Abd   Mild LUQ tenderness along rib Ext are without edema   Impression:   1  chest pain   Atypical      Sl elevation in troponin   (106 peak) Agree with plans for Lexiscan  myoview   2.  Hx of Afib   Transient     Pt in SR now Follow    Given EtOH hx I do not think he is a good candidate for anticoagulation      Vina Gull  MD

## 2024-02-29 LAB — MAGNESIUM: Magnesium: 1.4 mg/dL — ABNORMAL LOW (ref 1.7–2.4)

## 2024-02-29 LAB — BASIC METABOLIC PANEL WITH GFR
Anion gap: 8 (ref 5–15)
BUN: <5 mg/dL — ABNORMAL LOW (ref 6–20)
CO2: 24 mmol/L (ref 22–32)
Calcium: 8.3 mg/dL — ABNORMAL LOW (ref 8.9–10.3)
Chloride: 102 mmol/L (ref 98–111)
Creatinine, Ser: 0.98 mg/dL (ref 0.61–1.24)
GFR, Estimated: >60 mL/min (ref >60)
Glucose, Bld: 171 mg/dL — ABNORMAL HIGH (ref 70–99)
Potassium: 5 mmol/L (ref 3.5–5.1)
Sodium: 134 mmol/L — ABNORMAL LOW (ref 135–145)

## 2024-02-29 MED ORDER — HYDROMORPHONE HCL 1 MG/ML IJ SOLN
0.5000 mg | Freq: Once | INTRAMUSCULAR | Status: AC
  Administered 2024-02-29: 0.5 mg via INTRAVENOUS
  Filled 2024-02-29: qty 0.5

## 2024-02-29 MED ORDER — MAGNESIUM SULFATE 4 GM/100ML IV SOLN
4.0000 g | Freq: Once | INTRAVENOUS | Status: AC
  Administered 2024-02-29: 4 g via INTRAVENOUS
  Filled 2024-02-29: qty 100

## 2024-02-29 NOTE — Assessment & Plan Note (Signed)
 Mg 1.4, K 5.0. Low Mg likely 2/2 chronic etoh use and malabsorption. Ideally would like patient to be stable on PO Mg alone - Repleted with IV Magnesium  4g - Continue home Magnesium  oxide 400mg  daily - AM BMP, Mg

## 2024-02-29 NOTE — Assessment & Plan Note (Signed)
 Patient reported CP 10/21. EKG with  NSR and troponins slightly elevated but flat. Myoview  normal with no concern for blockage. Likely referred pain from acute on chronic pancreatitis. - Cardiology consulted, appreciate recommendations

## 2024-02-29 NOTE — Plan of Care (Signed)

## 2024-02-29 NOTE — Assessment & Plan Note (Signed)
 BPH - continue home tamsulosin  0.4 mg daily

## 2024-02-29 NOTE — Progress Notes (Signed)
 Daily Progress Note Intern Pager: [phone]  Patient name: [name] Medical record number: [id number] Date of birth: Jun 08, 1970 Age: 53 y.o. Gender: male  Primary Care Provider: Myra Wanda LABOR, MD Consultants: Cardiology (s/o), GI (s/o)  Code Status: Full code   Pt Overview and Major Events to Date:  10/14 - Admitted   Assessment and Plan:  Pleas Laquinton Bihm is a 53 y.o. male with a PMH of paroxysmal A-fib, WPW s/p ablation, chronic pancreatitis, polysubstance use disorder, anxiety/depression, chronic bronchitis admitted for A-fib with RVR 2/2 chronic EtOH use and dehydration and chronic pancreatitis. Now rate controlled with oral diltiazem. Workup for intermittent chest pain has been negative, likely referred pain. Patient did have an episode of emesis this morning so will plan to see how he tolerates a soft diet today with potential discharge later today/tomorrow. Assessment & Plan Chronic pancreatitis (HCC) Abdominal pain Acute on chronic. Continues to complain of pain. Diet advanced to soft yesterday which he seemed to be tolerating well until yesterday. - Continue Creon  108,000 units TID with meals and PRN with snacks - Continue daily regimen: Multivitamin with minerals, Pepcid  40mg , Protonix  40mg , Sucralfate  1g TID, Tums 400mg  TID PRN - Pain regimen: - Try K-pad on back, lidoderm  patch to chest/abdomen - Tylenol  1000 mg q6hrs scheduled - Continue Oxycodone  10mg  q4hrs PRN - IV Dilaudid  0.5 mg x1 - GI consulted, appreciate recommendations - Pseudocyst too small to drain, no further interventions - AM BMP, Mg Chest pain Patient reported CP 10/21. EKG with  NSR and troponins slightly elevated but flat. Myoview  normal with no concern for blockage. Likely referred pain from acute on chronic pancreatitis. - Cardiology consulted, appreciate recommendations Hypomagnesemia Hypokalemia Mg 1.4, K 5.0. Low Mg likely 2/2 chronic etoh use and malabsorption.  Ideally would like patient to be stable on PO Mg alone - Repleted with IV Magnesium  4g - Continue home Magnesium  oxide 400mg  daily - AM BMP, Mg Anemia Stable. Anemia panel indicates borderline iron deficiency anemia.  - Transfusion threshold Hgb < 7 - Continue PO iron 325 mg daily - Lab holiday given  Hgb remains stable Atrial fibrillation with RVR (HCC) HR at goal. - Cardiology consulted, appreciate recommendations (s/o) - Goal HR < 115 - Continue Diltiazem 24hr 240mg  BID - Continue Carvedilol  25mg  BID Substance use disorder Stable. - Continue Nicotine  patch and nicotine  gum - Continue Thiamine  100 mg daily, Folic acid  1 mg daily, and Multivitamin with minerals daily Chronic health problem BPH - continue home tamsulosin  0.4 mg daily  FEN/GI: Soft PPx: Lovenox  Dispo:Home tomorrow.  Subjective:  Patient reports an episode of emesis. Room had not been cleaned yet and it seems he threw up his breakfast of eggs, grits, and pancakes. Dinner yesterday was rice and a meat which he tolerated well. Complaining of nausea and abdominal pain. He is able to ambulate.  Objective: Temp:  [97.7 F (36.5 C)-98.6 F (37 C)] 98.3 F (36.8 C) (10/23 0512) Pulse Rate:  [60-74] 60 (10/23 0512) Resp:  [10-20] 13 (10/23 0512) BP: (101-139)/(68-97) 108/74 (10/23 0512) SpO2:  [100 %] 100 % (10/22 2321)  Physical Exam: General: Alert, in NAD.  Cardiovascular: RRR, no m/r/g appreciated. Pulmonary: Normal WOB. CTAB with no w/c/r present. Abdomen: Soft, TTP in LUQ, non-distended. Extremities: No cyanosis or edema. Neurologic: Normal gait, moves all four extremities appropriately  Laboratory: Most recent CBC Lab Results  Component Value Date   WBC 6.3 02/28/2024   HGB 9.1 (L) 02/28/2024   HCT 28.2 (  L) 02/28/2024   MCV 96.6 02/28/2024   PLT 156 02/28/2024   Most recent BMP    Latest Ref Rng & Units 02/29/2024    3:17 AM  BMP  Glucose 70 - 99 mg/dL 828   BUN 6 - 20 mg/dL <5    Creatinine 9.38 - 1.24 mg/dL 9.01   Sodium 864 - 854 mmol/L 134   Potassium 3.5 - 5.1 mmol/L 5.0   Chloride 98 - 111 mmol/L 102   CO2 22 - 32 mmol/L 24   Calcium  8.9 - 10.3 mg/dL 8.3    Mg 1.4  Imaging/Diagnostic Tests: Myoview : no evidence of decrease in blood flow, low risk study  Jerrie Gathers, DO 02/29/2024, 7:26 AM  PGY-1, [facility] Family Medicine FPTS Intern pager: [phone], text pages welcome Secure chat group [facility] Teaching Service

## 2024-02-29 NOTE — Assessment & Plan Note (Signed)
 Stable. - Continue Nicotine  patch and nicotine  gum - Continue Thiamine  100 mg daily, Folic acid  1 mg daily, and Multivitamin with minerals daily

## 2024-02-29 NOTE — Assessment & Plan Note (Addendum)
 Acute on chronic. Continues to complain of pain. Diet advanced to soft yesterday which he seemed to be tolerating well until yesterday. - Continue Creon  108,000 units TID with meals and PRN with snacks - Continue daily regimen: Multivitamin with minerals, Pepcid  40mg , Protonix  40mg , Sucralfate  1g TID, Tums 400mg  TID PRN - Pain regimen: - Try K-pad on back, lidoderm  patch to chest/abdomen - Tylenol  1000 mg q6hrs scheduled - Continue Oxycodone  10mg  q4hrs PRN - IV Dilaudid  0.5 mg x1 - GI consulted, appreciate recommendations - Pseudocyst too small to drain, no further interventions - AM BMP, Mg

## 2024-02-29 NOTE — Assessment & Plan Note (Signed)
 Stable. Anemia panel indicates borderline iron deficiency anemia.  - Transfusion threshold Hgb < 7 - Continue PO iron 325 mg daily - Lab holiday given  Hgb remains stable

## 2024-02-29 NOTE — Assessment & Plan Note (Signed)
 HR at goal. - Cardiology consulted, appreciate recommendations (s/o) - Goal HR < 115 - Continue Diltiazem 24hr 240mg  BID - Continue Carvedilol  25mg  BID

## 2024-03-01 ENCOUNTER — Other Ambulatory Visit (HOSPITAL_COMMUNITY): Payer: Self-pay

## 2024-03-01 LAB — BASIC METABOLIC PANEL WITH GFR
Anion gap: 10 (ref 5–15)
Anion gap: 10 (ref 5–15)
BUN: 12 mg/dL (ref 6–20)
BUN: 10 mg/dL (ref 6–20)
CO2: 24 mmol/L (ref 22–32)
CO2: 23 mmol/L (ref 22–32)
Calcium: 8.7 mg/dL — ABNORMAL LOW (ref 8.9–10.3)
Calcium: 8.6 mg/dL — ABNORMAL LOW (ref 8.9–10.3)
Chloride: 100 mmol/L (ref 98–111)
Chloride: 99 mmol/L (ref 98–111)
Creatinine, Ser: 1.48 mg/dL — ABNORMAL HIGH (ref 0.61–1.24)
Creatinine, Ser: 1.13 mg/dL (ref 0.61–1.24)
GFR, Estimated: 56 mL/min — ABNORMAL LOW (ref >60)
GFR, Estimated: >60 mL/min (ref >60)
Glucose, Bld: 143 mg/dL — ABNORMAL HIGH (ref 70–99)
Glucose, Bld: 114 mg/dL — ABNORMAL HIGH (ref 70–99)
Potassium: 5.7 mmol/L — ABNORMAL HIGH (ref 3.5–5.1)
Potassium: 4.6 mmol/L (ref 3.5–5.1)
Sodium: 134 mmol/L — ABNORMAL LOW (ref 135–145)
Sodium: 132 mmol/L — ABNORMAL LOW (ref 135–145)

## 2024-03-01 LAB — MAGNESIUM: Magnesium: 1.3 mg/dL — ABNORMAL LOW (ref 1.7–2.4)

## 2024-03-01 MED ORDER — FOLIC ACID 1 MG PO TABS: 1.0000 mg | ORAL_TABLET | Freq: Every day | ORAL | Status: AC

## 2024-03-01 MED ORDER — FERROUS SULFATE 325 (65 FE) MG PO TABS: 325.0000 mg | ORAL_TABLET | Freq: Every day | ORAL | Status: DC

## 2024-03-01 MED ORDER — TAMSULOSIN HCL 0.4 MG PO CAPS
0.4000 mg | ORAL_CAPSULE | Freq: Every day | ORAL | 1 refills | Status: DC
  Filled 2024-03-01: qty 30, 30d supply, fill #0

## 2024-03-01 MED ORDER — MAGNESIUM SULFATE 50 % IJ SOLN
8.0000 g | Freq: Once | INTRAVENOUS | Status: AC
  Administered 2024-03-01: 8 g via INTRAVENOUS
  Filled 2024-03-01: qty 16

## 2024-03-01 MED ORDER — VITAMIN B-1 100 MG PO TABS: 100.0000 mg | ORAL_TABLET | Freq: Every day | ORAL | Status: AC

## 2024-03-01 MED ORDER — NICOTINE POLACRILEX 2 MG MT GUM: 2.0000 mg | CHEWING_GUM | OROMUCOSAL | Status: AC | PRN

## 2024-03-01 MED ORDER — PANCRELIPASE (LIP-PROT-AMYL) 36000-114000 UNITS PO CPEP
ORAL_CAPSULE | ORAL | 0 refills | Status: DC
  Filled 2024-03-01: qty 200, 28d supply, fill #0

## 2024-03-01 MED ORDER — ENSURE PLUS HIGH PROTEIN PO LIQD: 237.0000 mL | Freq: Three times a day (TID) | ORAL | Status: AC

## 2024-03-01 MED ORDER — OXYCODONE HCL 10 MG PO TABS
10.0000 mg | ORAL_TABLET | Freq: Four times a day (QID) | ORAL | 0 refills | Status: DC | PRN
  Filled 2024-03-01: qty 20, 5d supply, fill #0

## 2024-03-01 MED ORDER — SODIUM ZIRCONIUM CYCLOSILICATE 10 G PO PACK
10.0000 g | PACK | Freq: Once | ORAL | Status: AC
  Administered 2024-03-01: 10 g via ORAL
  Filled 2024-03-01: qty 1

## 2024-03-01 MED ORDER — HYDROMORPHONE HCL 1 MG/ML IJ SOLN
0.5000 mg | Freq: Once | INTRAMUSCULAR | Status: AC
  Administered 2024-03-01: 0.5 mg via INTRAVENOUS
  Filled 2024-03-01: qty 0.5

## 2024-03-01 MED ORDER — CALCIUM CARBONATE ANTACID 500 MG PO CHEW: 2.0000 | CHEWABLE_TABLET | Freq: Three times a day (TID) | ORAL | Status: AC | PRN

## 2024-03-01 MED ORDER — DILTIAZEM HCL ER COATED BEADS 240 MG PO CP24
240.0000 mg | ORAL_CAPSULE | Freq: Two times a day (BID) | ORAL | 0 refills | Status: DC
  Filled 2024-03-01: qty 60, 30d supply, fill #0

## 2024-03-01 MED ORDER — NALOXONE HCL 4 MG/0.1ML NA LIQD
1.0000 | Freq: Every day | NASAL | 0 refills | Status: AC | PRN
  Filled 2024-03-01: qty 2, 2d supply, fill #0

## 2024-03-01 NOTE — Assessment & Plan Note (Addendum)
 Acute on chronic. Continues to complain of pain. Diet advanced to soft yesterday which he seemed to be tolerating well until yesterday. - Continue Creon  108,000 units TID with meals and PRN with snacks - Continue daily regimen: Multivitamin with minerals, Pepcid  40mg , Protonix  40mg , Sucralfate  1g TID, Tums 400mg  TID PRN - Pain regimen: - Try K-pad on back, lidoderm  patch to chest/abdomen - Tylenol  1000 mg q6hrs scheduled - Continue Oxycodone  10mg  q4hrs PRN - IV Dilaudid  0.5 mg x1 - GI consulted, appreciate recommendations - Pseudocyst too small to drain, no further interventions

## 2024-03-01 NOTE — Assessment & Plan Note (Addendum)
 Mg 1.3, K 5.7. Low Mg likely 2/2 chronic etoh use and malabsorption - Repleted with IV Magnesium  8g -  Lokelma  10g x1 - Repeat PM BMP to check K - Continue home Magnesium  oxide 400mg  daily - AM BMP, Mg

## 2024-03-01 NOTE — Discharge Summary (Signed)
 Family Medicine Teaching [facility] Discharge Summary  Patient name: [name] Medical record number: [id number] Date of birth: [birth year] Age: 53 y.o. Gender: male Date of Admission: 02/20/2024  Date of Discharge: 03/01/24 Admitting Physician: Suzann CHRISTELLA Daring, MD  Primary Care Provider: Myra Wanda LABOR, MD Consultants: Cardiology, GI  Indication for Hospitalization: A-fib with RVR, chronic pancreatitis  Discharge Diagnoses/Problem List:  Principal Problem for Admission: A-fib with RVR, chronic pancreatitis Other Problems addressed during stay:  Principal Problem:   Chronic pancreatitis (HCC) Active Problems:   Substance use disorder   Hypomagnesemia   Hypokalemia   Abdominal pain   Atrial fibrillation with RVR (HCC)   Chronic health problem   Anemia   Brief Hospital Course:  [name] is a 53 y.o.male with history of substance use disorder, anxiety/depression, Wolff-Parkinson-White syndrome, A-fib, chronic pancreatitis, seizures from alcohol  withdrawal, chronic bronchitis who was admitted to the [facility] Medicine Teaching Service at [facility] for atrial fibrillation with RVR and acute on chronic pancreatitis. His hospital course is outlined below:  A-fib with RVR Presented with chest heaviness and palpitations in afib with RVR on admission.  Suspect RVR in the setting of alcohol  use and dehydration. Cardiology consulted and continued home carvedilol  and started diltiazem drip. TSH and free T4 normal. Initially transitioned to PO diltiazem but was placed back on drip given HR remained elevated. One time dose of digoxin  was administered 10/16. HR stabilized to goal of < 115 bpm and patient was weaned off IV diltiazem drip and transitioned back to PO dilitazem. HR remained at goal throughout the remainder of his hospital stay.  Acute on chronic pancreatitis Patient presented with N/V and epigastric pain.  Lipase within normal limits. Patient placed on  clear liquid diet and MIVF while limited PO intake.  Pain management with Tylenol  and oxycodone  which was titrated as needed and spot doses IV dilaudid  given as needed. Home treatment with Creon , pepcid , protonix , sucralfate  was adjusted as needed. CTA PE ordered for chest pain (see below) showed a 4cm pancreatic pseudocyst increased from previous imaging for which GI was consulted and recommenced no further interventinos as cyst is too small to drain. Diet advanced as tolerated. Initially unable to tolerate regular diet so provided further education on low-fat, low-grease diet. He was able to tolerate a soft diet at the time of discharge.  Polysubstance Use Disorder Patient with history of chronic alcohol  use and history of alcohol  withdrawal seizures x2, most recently the week before admission. Patient was placed on CIWA precautions and started on a phenobarbital taper. CIWAs remained low and patient did not experience withdrawal symptoms while admitted.  Hypomagnesemia Magnesium  remained very low throughout admission and was repleted aggressively as needed. Continued home magnesium .  Hypokalemia Initially presented with K 3.4 which improved with repletion. K 5.7 prior to discharge, given Lokelma  x1 and repeat stable. Recommend following up outpatient.  Anemia Hgb 11.5 on admission which decreased to 8.2. Anemia panel indicates iron-deficiency anemia. Normal folate and Vitamin B12. Started on oral iron supplementation. Repeat CBC prior to discharge showed stable Hgb 9.1.  Chest pain Patient reported sharp, central chest pain with radiation to the left chest wall for which CTA PE was ordered to rule out PE vs aortic dissection. Results negative for PE/dissection. Later reporting midsternal chest pain. EKG at the time with NSR and troponins were elevated but flat. Cardiology was consulted and ordered a Lexiscan  Myoview  for risk stratification which showed no evidence of blockage.  Other chronic  conditions  were medically managed with home medications and formulary alternatives as necessary (GERD, BPH)  PCP recommendations: Alcohol  cessation counseling Repeat CBC given anemia as above Ensure outpatient follow up with Cardiology for afib management Ensure follow up with GI in the setting of chronic pancreatitis. Consider intervention for chronic pain control. Repeat BMP and Mag   Results/Tests Pending at Time of Discharge: None  Disposition: Home  Discharge Condition: Stable  Discharge Exam:  Vitals:   03/01/24 0512 03/01/24 1539  BP: 108/66 114/68  Pulse: 70 60  Resp: 14 11  Temp: 98.2 F (36.8 C) 98.1 F (36.7 C)  SpO2: 100% 100%   General: Alert, in NAD.  Cardiovascular: RRR, no m/r/g appreciated. Pulmonary: Normal WOB. CTAB with no w/c/r present. Abdomen: Soft, TTP in LUQ, non-distended. Extremities: No cyanosis or edema. Neurologic: Normal gait, moves all four extremities appropriatel  Significant Procedures:  NM Myocar Multi W/Spect W/Wall Motion / EF Result Date: 02/28/2024   The study is normal. The study is low risk.   No ST deviation was noted.   LV perfusion is normal.   Left ventricular function is normal. End diastolic cavity size is normal. End systolic cavity size is normal.   Prior study available for comparison. There are changes compared to prior study. The left ventricular ejection fraction has normalized.   Significant Labs and Imaging:  No results for input(s): WBC, HGB, HCT, PLT in the last 48 hours. Recent Labs  Lab 02/29/24 0317 03/01/24 0313 03/01/24 1511  NA 134* 134* 132*  K 5.0 5.7* 4.6  CL 102 100 99  CO2 24 24 23   GLUCOSE 171* 143* 114*  BUN <5* 12 10  CREATININE 0.98 1.48* 1.13  CALCIUM  8.3* 8.7* 8.6*  MG 1.4* 1.3*  --     Pertinent Imaging    CT Angio Chest Pulmonary Embolism (PE) W or WO Contrast Result Date: 02/21/2024  1. No evidence of pulmonary embolism or aortic dissection. Mild aortic atherosclerosis.  2. Small anterior pericardial effusion. 3. Pancreatic parenchymal calcifications compatible with chronic calcific pancreatitis and a 4 cm pancreatic body cystic structure, likely a pseudocyst, increased from 2.3 cm on prior studies from 2022 and 2023. unchanged compared with more recent studies.   DG Chest 1 View Result Date: 02/20/2024  Emphysematous changes but no acute overlying pulmonary process.    Discharge Medications:  Allergies as of 03/01/2024       Reactions   Robaxin  [methocarbamol ] Other (See Comments)   Myoclonus    Bayer Aspirin  [aspirin ] Other (See Comments)   Unknown reaction   Trazodone  And Nefazodone Other (See Comments)   Muscle spasms   Adhesive [tape] Itching   Latex Itching   Toradol  [ketorolac  Tromethamine ] Other (See Comments)   Hx ulcers   Contrast Media [iodinated Contrast Media] Hives   Fish Allergy Nausea And Vomiting, Rash   Reglan  [metoclopramide ] Other (See Comments)   Muscle spasms   Salmon [fish Oil] Nausea And Vomiting, Rash   Shellfish Allergy Nausea And Vomiting, Rash        Medication List     STOP taking these medications    lidocaine  2 % solution Commonly known as: XYLOCAINE    ondansetron  4 MG disintegrating tablet Commonly known as: ZOFRAN -ODT   oxyCODONE -acetaminophen  5-325 MG tablet Commonly known as: Percocet       TAKE these medications    acetaminophen  500 MG tablet Commonly known as: TYLENOL  Take 1,000 mg by mouth daily as needed for mild pain (pain score 1-3) or moderate  pain (pain score 4-6).   calcium  carbonate 500 MG chewable tablet Commonly known as: TUMS - dosed in mg elemental calcium  Chew 2 tablets (400 mg of elemental calcium  total) by mouth 3 (three) times daily as needed for indigestion or heartburn.   carvedilol  25 MG tablet Commonly known as: COREG  Take 1 tablet (25 mg total) by mouth 2 (two) times daily with a meal.   CertaVite/Antioxidants Tabs Take 1 tablet by mouth daily. What changed: how  much to take   diltiazem 240 MG 24 hr capsule Commonly known as: CARDIZEM CD Take 1 capsule (240 mg total) by mouth 2 (two) times daily.   famotidine  40 MG tablet Commonly known as: PEPCID  Take 1 tablet (40 mg total) by mouth daily. What changed: when to take this   feeding supplement Liqd Take 237 mLs by mouth 3 (three) times daily between meals.   ferrous sulfate  325 (65 FE) MG tablet Take 1 tablet (325 mg total) by mouth daily with breakfast.   folic acid  1 MG tablet Commonly known as: FOLVITE  Take 1 tablet (1 mg total) by mouth daily.   lipase/protease/amylase 63999 UNITS Cpep capsule Commonly known as: Creon  Take 3 capsules (108,000 Units total) by mouth 3 (three) times daily with meals. May also take 1 capsule (36,000 Units total) as needed (with snacks). What changed: See the new instructions.   loperamide  2 MG tablet Commonly known as: IMODIUM  A-D Take 2 mg by mouth 4 (four) times daily as needed for diarrhea or loose stools.   magnesium  oxide 400 (240 Mg) MG tablet Commonly known as: MAG-OX Take 1 tablet (400 mg total) by mouth daily.   naloxone  4 MG/0.1ML Liqd nasal spray kit Commonly known as: Narcan  Place 1 spray into the nose daily as needed (Opioid overdose).   nicotine  21 mg/24hr patch Commonly known as: NICODERM CQ  - dosed in mg/24 hours Place 1 patch (21 mg total) onto the skin daily. What changed:  when to take this reasons to take this   nicotine  polacrilex 2 MG gum Commonly known as: NICORETTE  Take 1 each (2 mg total) by mouth as needed for smoking cessation.   Oxycodone  HCl 10 MG Tabs Take 1 tablet (10 mg total) by mouth every 6 (six) hours as needed for up to 20 doses (second line after scheduled tylenol ). What changed:  when to take this reasons to take this   pantoprazole  40 MG tablet Commonly known as: PROTONIX  Take 1 tablet (40 mg total) by mouth daily.   polyethylene glycol powder 17 GM/SCOOP powder Commonly known as:  GLYCOLAX /MIRALAX  Take 17 g by mouth daily as needed for mild constipation.   sucralfate  1 GM/10ML suspension Commonly known as: CARAFATE  Take 1 g by mouth 4 (four) times daily -  with meals and at bedtime. What changed: Another medication with the same name was removed. Continue taking this medication, and follow the directions you see here.   tamsulosin  0.4 MG Caps capsule Commonly known as: FLOMAX  Take 1 capsule (0.4 mg total) by mouth daily.   thiamine  100 MG tablet Commonly known as: Vitamin B-1 Take 1 tablet (100 mg total) by mouth daily.        Discharge Instructions: Please refer to Patient Instructions section of EMR for full details.  Patient was counseled important signs and symptoms that should prompt return to medical care, changes in medications, dietary instructions, activity restrictions, and follow up appointments.   Follow-Up Appointments:  Follow-up Information     West, Katlyn D,  NP Follow up on 03/21/2024.   Specialty: Cardiology Why: Hospital Follow Up Appt Time: 8:00 Please arrive 15 min early. Bring all medications. Contact information: [name] [place] [address] [phone]                 Jerrie Gathers, DO 03/01/2024, 1:10 PM PGY-1, [facility]  Upper Level Addendum:   I have reviewed the above note, making necessary revisions as appropriate.  I agree with the medical decision making.   Izetta Nap, DO PGY-3, [facility]

## 2024-03-01 NOTE — TOC Initial Note (Signed)
 Transition of Care (TOC) - Initial/Assessment Note  Jason Gobble RN, BSN Inpatient Care Management Unit 4E- RN Case Manager See Treatment Team for direct phone #   Patient Details  Name: [name] MRN: [id number] Date of Birth: July 13, 1970  Transition of Care [facility]) CM/SW Contact:    Moran Jason Hurst, RN Phone Number: 03/01/2024, 2:18 PM  Clinical Narrative:                 Pt lives at home w/ mom, active hx of ETOH and poly substance use.  Independent with ADLs   PCP-  [name]  Has insurance coverage w/ [employer]   Anticipate pt will return home, no anticipated needs- Inpatient Care Management (ICM) will continue to monitor patient advancement through interdisciplinary progression rounds. If new patient transition needs arise, please place a ICM (CM/CSW) consult.    Expected Discharge Plan: Home/Self Care     Patient Goals and CMS Choice      Return home      Expected Discharge Plan and Services In-house Referral: Clinical Social Work   Post Acute Care Choice: NA Living arrangements for the past 2 months: Single Family Home                                      Prior Living Arrangements/Services Living arrangements for the past 2 months: Single Family Home Lives with:: Parents Patient language and need for interpreter reviewed:: Yes Do you feel safe going back to the place where you live?: Yes          Current home services: DME Criminal Activity/Legal Involvement Pertinent to Current Situation/Hospitalization: No - Comment as needed  Activities of Daily Living   ADL Screening (condition at time of admission) Independently performs ADLs?: Yes (appropriate for developmental age) Is the patient deaf or have difficulty hearing?: No Does the patient have difficulty seeing, even when wearing glasses/contacts?: No Does the patient have difficulty concentrating, remembering, or making decisions?: No  Permission Sought/Granted                   Emotional Assessment Appearance:: Appears stated age     Orientation: : Oriented to Self, Oriented to Place, Oriented to  Time, Oriented to Situation Alcohol  / Substance Use: Alcohol  Use, Illicit Drugs    Admission diagnosis:  Chronic abdominal pain [R10.9, G89.29] WPW (Wolff-Parkinson-Moran syndrome) [I45.6] Atrial fibrillation with rapid ventricular response (HCC) [I48.91] Atrial fibrillation with RVR (HCC) [I48.91] Chest pain, unspecified type [R07.9] Patient Active Problem List   Diagnosis Date Noted   Anemia 02/25/2024   Atrial fibrillation with RVR (HCC) 02/20/2024   Chronic health problem 02/20/2024   Atrial fibrillation with rapid ventricular response (HCC) 12/03/2023   Abdominal pain 11/22/2023   Hypertensive urgency 11/12/2023   Syncope and collapse 11/10/2023   Non-cardiac chest pain 11/10/2023   Intractable diarrhea 08/10/2023   Intractable nausea and vomiting 06/18/2023   History of peptic ulcer 03/07/2023   Lumbosacral radiculopathy 03/07/2023   Duodenitis 03/06/2023   Gastric AVM 03/06/2023   AVM (arteriovenous malformation) of small bowel, 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   Hyperkalemia 03/03/2023   Acute urinary retention 03/01/2023   Intractable pain 02/09/2023   Metabolic acidosis, increased anion gap 02/08/2023   Cocaine abuse (HCC) 02/08/2023   Adynamic ileus (HCC) 01/25/2023   Cannabinoid hyperemesis syndrome 01/25/2023   Epigastric abdominal pain 01/09/2023   Asthma, chronic 01/09/2023   Ileus (HCC)  01/09/2023   Hypokalemia 09/18/2022   NSVT (nonsustained ventricular tachycardia) (HCC) 01/20/2022   Normal anion gap metabolic acidosis 12/08/2021   Hypoalbuminemia 12/08/2021   WPW (Wolff-Parkinson-Moran syndrome) 12/06/2021   Paroxysmal atrial fibrillation (HCC) 12/06/2021   Pulmonary nodule 12/06/2021   Leukocytosis 06/07/2021   Epigastric pain 06/07/2021   Urinary retention    Protein-calorie malnutrition, severe 11/17/2020    Hypomagnesemia    Hypocalcemia 10/23/2020   Seizure (HCC) 11/13/2018   Acute on chronic pancreatitis (HCC) 09/28/2017   Tachycardia 03/18/2017   Nausea, vomiting, and diarrhea 03/18/2017   Diarrhea 03/18/2017   GERD (gastroesophageal reflux disease)    Alcohol  use disorder 12/05/2016   Normocytic anemia 12/05/2016   Alcohol  use disorder, severe, dependence (HCC) 07/25/2016   Major depressive disorder, 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   Chronic pancreatitis (HCC) 05/18/2015   Polysubstance abuse (tobacco, cocaine, THC, and ETOH) 03/26/2015   Essential hypertension 02/06/2014   Mood disorder (HCC) 02/06/2014   Pancreatic pseudocyst/cyst 11/25/2013   Substance use disorder 10/06/2013   [name] pattern seen on electrocardiogram 10/03/2012   Alcohol  abuse 03/23/2007   Tobacco abuse 03/23/2007   PCP:  Jason Jason LABOR, MD Pharmacy:   [facility] [phone] [place], [place] - [address] AT [facility] OF [address] [place]  [address] Phone: [phone] Fax: [phone]  [name] [address] [place] [address] Phone: [phone] Fax: [phone]     Social Drivers of Health (SDOH) Social History: SDOH Screenings   Food Insecurity: No Food Insecurity (02/21/2024)  Housing: Low Risk  (02/21/2024)  Recent Concern: Housing - High Risk (11/23/2023)  Transportation Needs: No Transportation Needs (02/21/2024)  Recent Concern: Transportation Needs - Unmet Transportation Needs (11/23/2023)  Utilities: Not At Risk (02/21/2024)  Alcohol  Screen: Medium Risk (03/14/2017)  Depression (PHQ2-9): Medium Risk (03/27/2020)  Social Connections: Socially Isolated (11/10/2023)  Tobacco Use: High Risk (02/20/2024)   SDOH Interventions:     Readmission Risk Interventions    06/19/2023    4:11 PM 03/07/2023    4:21 PM 01/10/2023    4:32 PM  Readmission Risk  Prevention Plan  Transportation Screening Complete Complete Complete  Medication Review (RN Care Manager) Referral to Pharmacy Complete Complete  PCP or Specialist appointment within 3-5 days of discharge Complete Complete Complete  HRI or Home Care Consult Complete Complete Complete  SW Recovery Care/Counseling Consult Complete Complete Complete  Palliative Care Screening Not Applicable Not Applicable Not Applicable  Skilled Nursing Facility Not Applicable Not Applicable Not Applicable

## 2024-03-01 NOTE — Progress Notes (Signed)
 Daily Progress Note Intern Pager: [phone]  Patient name: [name] Medical record number: [id number] Date of birth: 1970-11-08 Age: 53 y.o. Gender: male  Primary Care Provider: Myra Wanda LABOR, MD Consultants: Cardiology (s/o), GI (s/o)  Code Status: Full code  Pt Overview and Major Events to Date:  Cardiology (s/o), GI (s/o)   Assessment and Plan:  [name] is a 53 y.o. male with a PMH of paroxysmal A-fib, WPW s/p ablation, chronic pancreatitis, polysubstance use disorder, anxiety/depression, chronic bronchitis admitted for A-fib with RVR 2/2 chronic EtOH use and dehydration and chronic pancreatitis. Now rate controlled with oral diltiazem. Workup for intermittent chest pain has been negative, likely referred pain. Plan to discharge today if tolerating diet. Assessment & Plan Chronic pancreatitis (HCC) Abdominal pain Acute on chronic. Continues to complain of pain. Diet advanced to soft yesterday which he seemed to be tolerating well until yesterday. - Continue Creon  108,000 units TID with meals and PRN with snacks - Continue daily regimen: Multivitamin with minerals, Pepcid  40mg , Protonix  40mg , Sucralfate  1g TID, Tums 400mg  TID PRN - Pain regimen: - Try K-pad on back, lidoderm  patch to chest/abdomen - Tylenol  1000 mg q6hrs scheduled - Continue Oxycodone  10mg  q4hrs PRN - IV Dilaudid  0.5 mg x1 - GI consulted, appreciate recommendations - Pseudocyst too small to drain, no further interventions Chest pain (Resolved: 03/01/2024) Patient reported CP 10/21. EKG with  NSR and troponins slightly elevated but flat. Myoview  normal with no concern for blockage. Likely referred pain from acute on chronic pancreatitis. - Cardiology consulted, appreciate recommendations Hypomagnesemia Hypokalemia Mg 1.3, K 5.7. Low Mg likely 2/2 chronic etoh use and malabsorption - Repleted with IV Magnesium  8g -  Lokelma  10g x1 - Repeat PM BMP to check K - Continue home  Magnesium  oxide 400mg  daily - AM BMP, Mg Anemia Stable. Anemia panel indicates borderline iron deficiency anemia.  - Transfusion threshold Hgb < 7 - Continue PO iron 325 mg daily - Lab holiday given  Hgb remains stable Atrial fibrillation with RVR (HCC) HR at goal. - Cardiology consulted, appreciate recommendations (s/o) - Goal HR < 115 - Continue Diltiazem 24hr 240mg  BID - Continue Carvedilol  25mg  BID Substance use disorder Stable. - Continue Nicotine  patch and nicotine  gum - Continue Thiamine  100 mg daily, Folic acid  1 mg daily, and Multivitamin with minerals daily Chronic health problem BPH - continue home tamsulosin  0.4 mg daily  Cr 1.5 today, increased from 0.98. Patient is tolerating PO intake and this may be false so plan to recheck BMP. If Cr remains elevated, I believe it is appropriate for him to follow up on this outpatient. Encourage adequate hydration.  FEN/GI: Soft PPx: Lovenox  Dispo:Home today.   Subjective:  Patient sitting up in bed. States he ate dinner last night. Experienced nausea but no vomiting. Eating breakfast this morning and states he is taking his time. Agreeable to discharge if he is still feeling better in PM.  Objective: Temp:  [97.6 F (36.4 C)-98.9 F (37.2 C)] 98.2 F (36.8 C) (10/24 0512) Pulse Rate:  [68-77] 70 (10/24 0512) Resp:  [14-20] 14 (10/24 0512) BP: (104-133)/(66-92) 108/66 (10/24 0512) SpO2:  [100 %] 100 % (10/24 9487)  Physical Exam: General: Alert, in NAD.  Cardiovascular: RRR, no m/r/g appreciated. Pulmonary: Normal WOB. CTAB with no w/c/r present. Abdomen: Soft, TTP in LUQ, non-distended. Extremities: No cyanosis or edema. Neurologic: Normal gait, moves all four extremities appropriately  Laboratory: Most recent CBC Lab Results  Component Value Date  WBC 6.3 02/28/2024   HGB 9.1 (L) 02/28/2024   HCT 28.2 (L) 02/28/2024   MCV 96.6 02/28/2024   PLT 156 02/28/2024   Most recent BMP    Latest Ref Rng & Units  03/01/2024    3:13 AM  BMP  Glucose 70 - 99 mg/dL 856   BUN 6 - 20 mg/dL 12   Creatinine 9.38 - 1.24 mg/dL 8.51   Sodium 864 - 854 mmol/L 134   Potassium 3.5 - 5.1 mmol/L 5.7   Chloride 98 - 111 mmol/L 100   CO2 22 - 32 mmol/L 24   Calcium  8.9 - 10.3 mg/dL 8.7     Imaging/Diagnostic Tests: No new imaging  Jerrie Gathers, DO 03/01/2024, 7:58 AM  PGY-1, [facility] FPTS Intern pager: [phone], text pages welcome Secure chat group [facility] Teaching Service

## 2024-03-01 NOTE — Assessment & Plan Note (Signed)
 Stable. - Continue Nicotine  patch and nicotine  gum - Continue Thiamine  100 mg daily, Folic acid  1 mg daily, and Multivitamin with minerals daily

## 2024-03-01 NOTE — Assessment & Plan Note (Signed)
 BPH - continue home tamsulosin  0.4 mg daily

## 2024-03-01 NOTE — Assessment & Plan Note (Signed)
 Patient reported CP 10/21. EKG with  NSR and troponins slightly elevated but flat. Myoview  normal with no concern for blockage. Likely referred pain from acute on chronic pancreatitis. - Cardiology consulted, appreciate recommendations

## 2024-03-01 NOTE — Assessment & Plan Note (Signed)
 Stable. Anemia panel indicates borderline iron deficiency anemia.  - Transfusion threshold Hgb < 7 - Continue PO iron 325 mg daily - Lab holiday given  Hgb remains stable

## 2024-03-01 NOTE — Assessment & Plan Note (Signed)
 HR at goal. - Cardiology consulted, appreciate recommendations (s/o) - Goal HR < 115 - Continue Diltiazem 24hr 240mg  BID - Continue Carvedilol  25mg  BID

## 2024-03-04 ENCOUNTER — Telehealth (INDEPENDENT_AMBULATORY_CARE_PROVIDER_SITE_OTHER): Payer: Self-pay

## 2024-03-04 NOTE — Transitions of Care (Post Inpatient/ED Visit) (Signed)
 03/04/2024  Name: [name] MRN: [id number] DOB: 04/02/1971  Today's TOC FU Call Status: Today's TOC FU Call Status:: Successful TOC FU Call Completed TOC FU Call Complete Date: 03/04/24 Patient's Name and Date of Birth confirmed.  Transition Care Management Follow-up Telephone Call Date of Discharge: 03/01/24 Discharge Facility: [name]) Type of Discharge: Inpatient Admission Primary Inpatient Discharge Diagnosis:: pancreatitis How have you been since you were released from the hospital?: Better Any questions or concerns?: No  Items Reviewed: Did you receive and understand the discharge instructions provided?: Yes Medications obtained,verified, and reconciled?: Yes (Medications Reviewed) Any new allergies since your discharge?: No Dietary orders reviewed?: Yes Do you have support at home?: Yes People in Home [RPT]: parent(s)  Medications Reviewed Today: Medications Reviewed Today     Reviewed by Emmitt Pan, LPN (Licensed Practical Nurse) on 03/04/24 at 1418  Med List Status: <None>   Medication Order Taking? Sig Documenting Provider Last Dose Status Informant  acetaminophen  (TYLENOL ) 500 MG tablet [id number] Yes Take 1,000 mg by mouth daily as needed for mild pain (pain score 1-3) or moderate pain (pain score 4-6). [provider]  Active Self, Pharmacy Records   Patient not taking:   Discontinued 08/08/19 2041 calcium  carbonate (TUMS - DOSED IN MG ELEMENTAL CALCIUM ) 500 MG chewable tablet [id number] Yes Chew 2 tablets (400 mg of elemental calcium  total) by mouth 3 (three) times daily as needed for indigestion or heartburn. Theophilus Pagan, MD  Active   carvedilol  (COREG ) 25 MG tablet [id number] Yes Take 1 tablet (25 mg total) by mouth 2 (two) times daily with a meal. West, Katlyn D, NP  Active Self, Pharmacy Records  diltiazem (CARDIZEM CD) 240 MG 24 hr capsule [id number] Yes Take 1 capsule (240 mg total) by mouth 2 (two) times daily. Theophilus Pagan, MD  Active   famotidine  (PEPCID ) 40 MG tablet [id number] Yes Take 1 tablet (40 mg total) by mouth daily.  Patient taking differently: Take 40 mg by mouth 2 (two) times daily.   Henderly, Britni A, PA-C  Active Pharmacy Records, Self  feeding supplement (ENSURE PLUS HIGH PROTEIN) LIQD [id number] Yes Take 237 mLs by mouth 3 (three) times daily between meals. Theophilus Pagan, MD  Active   ferrous sulfate  325 (65 FE) MG tablet [id number] Yes Take 1 tablet (325 mg total) by mouth daily with breakfast. Theophilus Pagan, MD  Active   folic acid  (FOLVITE ) 1 MG tablet [id number] Yes Take 1 tablet (1 mg total) by mouth daily. Theophilus Pagan, MD  Active   lipase/protease/amylase (CREON ) 36000 UNITS CPEP capsule [id number] Yes Take 3 capsules (108,000 Units total) by mouth 3 (three) times daily with meals. May also take 1 capsule (36,000 Units total) as needed (with snacks). Theophilus Pagan, MD  Active   loperamide  (IMODIUM  A-D) 2 MG tablet [id number] Yes Take 2 mg by mouth 4 (four) times daily as needed for diarrhea or loose stools. [provider]  Active Pharmacy Records, Self  magnesium  oxide (MAG-OX) 400 (240 Mg) MG tablet [id number] Yes Take 1 tablet (400 mg total) by mouth daily. Hoy Nidia FALCON, PA-C  Active Pharmacy Records, Self  Multiple Vitamin (MULTIVITAMIN WITH MINERALS) TABS tablet [id number] Yes Take 1 tablet by mouth daily.  Patient taking differently: Take 2 tablets by mouth daily.   Raenelle Donalda HERO, MD  Active Self, Pharmacy Records  naloxone  (NARCAN ) nasal spray 4 mg/0.1 mL [id number] Yes Place 1 spray into the nose daily as needed (Opioid overdose). Theophilus Pagan, MD  Active   nicotine  (NICODERM CQ  - DOSED IN MG/24 HOURS) 21 mg/24hr patch [id number] Yes Place 1 patch (21 mg total) onto the skin daily.  Patient taking differently: Place 21 mg onto the skin daily as needed (for smoking cessation).   Caleen Burgess BROCKS, MD  Active Pharmacy Records, Self  nicotine  polacrilex  (NICORETTE ) 2 MG gum [id number] Yes Take 1 each (2 mg total) by mouth as needed for smoking cessation. Theophilus Pagan, MD  Active   Oxycodone  HCl 10 MG TABS [id number] Yes Take 1 tablet (10 mg total) by mouth every 6 (six) hours as needed for up to 20 doses (second line after scheduled tylenol ). Pruett, Milda CROME, MD  Active   pantoprazole  (PROTONIX ) 40 MG tablet [id number] Yes Take 1 tablet (40 mg total) by mouth daily. Alva Larraine FALCON, PA-C  Active Self, Pharmacy Records  polyethylene glycol powder (GLYCOLAX /MIRALAX ) 17 GM/SCOOP powder [id number] Yes Take 17 g by mouth daily as needed for mild constipation. Caleen Burgess BROCKS, MD  Active Pharmacy Records, Self  sucralfate  (CARAFATE ) 1 GM/10ML suspension [id number] Yes Take 1 g by mouth 4 (four) times daily -  with meals and at bedtime. [provider]  Active Self, Pharmacy Records  tamsulosin  (FLOMAX ) 0.4 MG CAPS capsule [id number] Yes Take 1 capsule (0.4 mg total) by mouth daily. Theophilus Pagan, MD  Active   thiamine  (VITAMIN B-1) 100 MG tablet [id number] Yes Take 1 tablet (100 mg total) by mouth daily. Theophilus Pagan, MD  Active             Home Care and Equipment/Supplies: Were Home Health Services Ordered?: NA Any new equipment or medical supplies ordered?: NA  Functional Questionnaire: Do you need assistance with bathing/showering or dressing?: No Do you need assistance with meal preparation?: No Do you need assistance with eating?: No Do you have difficulty maintaining continence: No Do you need assistance with getting out of bed/getting out of a chair/moving?: No Do you have difficulty managing or taking your medications?: No  Follow up appointments reviewed: PCP Follow-up appointment confirmed?: No (transferred to [facility]) MD Provider Line [phone] Given: No Specialist Hospital Follow-up appointment confirmed?: No Reason Specialist Follow-Up Not Confirmed: Patient has Specialist Provider Number and will  Call for Appointment Do you need transportation to your follow-up appointment?: No Do you understand care options if your condition(s) worsen?: Yes-patient verbalized understanding    SIGNATURE Julian Lemmings, LPN [facility] Nurse Health Advisor Direct Dial [phone]

## 2024-03-20 NOTE — Progress Notes (Deleted)
 Cardiology Office Note    Date:  03/20/2024  ID:  [name], DOB March 05, 1971, MRN [id number] PCP:  Jason Wanda LABOR, MD  Cardiologist:  Jason Schilling, MD  Electrophysiologist:  None   Chief Complaint: ***  History of Present Illness: .    [name] is a 53 y.o. 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   [name] had remote cardiac catheterization in 2014 with no coronary disease. He had an EKG in 2014 with preexcitation ST segment changes consistent with WPW, Per patient's prior reports he underwent WPW ablation in 2014/2015 although unclear as to where, patient previously reported this took place at [facility] however there is no documentation of this procedure. Nuclear stress test in 2023 showed no reversible ischemia, considered intermediate risk due to EF 47% however follow-up echo 02/2022 showed EF 65-70%. Per notes patient also has history of paroxysmal atrial fibrillation however has not been started anticoagulation due to low CHA2DS2-VASc and ongoing alcohol  use.   Patient has had over 40 hospital encounters in 2025 this for for variety of issues including chronic pancreatitis, chronic abdominal pain, paroxysmal atrial fibrillation, episodic mildly elevated troponin levels (other times negative), seizure-like activity, syncope/collapse and various other medical ailments, at times care complicated by belligerence and disruptive behavior.  Inpatient therapies for his A-fib have included IV amiodarone , IV procainamide , IV magnesium  and diltiazem.  Patient last received IV procainamide  in July 2025.  Per notes case was previously discussed with the EP who felt there was no contraindication to  beta-blocker, carvedilol  has previously been chosen over metoprolol  due to cocaine use.  Echo in 11/2023 showed EF 70 to 75%, G1 DD, mildly elevated PASP, no significant change from prior.  Event monitor in 11/2023 showed normal sinus rhythm, average 93 bpm, 1 7 beat run of NSVT, 25 short episodes of PSVT, longest 7 beats, 1.7% PACs, rare PVCs, no A-fib or sustained arrhythmias.  Cardiology was consulted during admission in 02/2024 after he returned to the ED with complaints of tachycardia for the prior several hours associated with chest discomfort that started after drinking Gatorade.  Per notes heart rate was in the 180s by EMS (AF by strips), given 20 mg of Cardizem, 4 mg Zofran , 4 mg morphine  with improvement in heart rate to the 140s.  EKG showed A-fib with RVR at 118 bpm on arrival though subsequent heart rate was variable from 140s to 190s (afib with episodic WCT felt abberancy).  He also reported having nausea, vomiting, diarrhea as well as productive cough.  Patient reported adherence with carvedilol .  Lab work showed hypokalemia of 3.4, hypocalcemia of 7.3, AST 80, ALT within normal limits, creatinine within normal limits, high sensitive bones were negative, hemoglobin 11.5.  Patient was started on procainamide  in the ED however Dr. Waddell reviewed EKGs and did not feel recent tracing showed preexcitation and recommended discontinuation.  Patient reported that he had used alcohol , cocaine and THC recently.  Patient underwent nuclear stress test on 02/28/2024 in setting of chest pain, study was normal and low risk, LV function was normal.  Patient converted to normal sinus rhythm while inpatient was continued on Coreg  25 mg twice daily and diltiazem 240 mg daily.  Patient was discharged on 03/01/2024  On chart review patient was admitted to [name] on 03/08/2024  with acute abdominal pain and chest pain with shortness of breath.  Patient then reported that chest pain was more epigastric, troponins  were negative.  Today he presents for follow-up.  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 Continue Polysubstance abuse with alcohol  abuse, cocaine use and THC use Chronic abdominal pain, nausea, vomiting with chronic pancreatitis:   Labwork independently reviewed:   ROS: .   *** denies chest pain, shortness of breath, lower extremity edema, fatigue, palpitations, melena, hematuria, hemoptysis, diaphoresis, weakness, presyncope, syncope, orthopnea, and PND.  All other systems are reviewed and otherwise negative.  Studies Reviewed: SABRA    EKG:  EKG is ordered today, personally reviewed, demonstrating ***     CV Studies: Cardiac studies reviewed are outlined and summarized above. Otherwise please see EMR for full report. Cardiac Studies & Procedures   ______________________________________________________________________________________________   STRESS TESTS  NM MYOCAR MULTI W/SPECT W 02/28/2024  Narrative   The study is normal. The study is low risk.   No ST deviation was noted.   LV perfusion is normal.   Left ventricular function is normal. End diastolic cavity size is normal. End systolic cavity size is normal.   Prior study available for comparison. There are changes compared to prior study. The left ventricular ejection fraction has normalized.   Electronically signed by Jason Balding, MD   ECHOCARDIOGRAM  ECHOCARDIOGRAM COMPLETE 11/10/2023  Narrative ECHOCARDIOGRAM REPORT    Patient Name:   [name] Date of Exam: 11/10/2023 Medical Rec #:  [id number]              Height:       68.0 in Accession #:    [id number]             Weight:       122.7 lb Date of Birth:  August 05, 1970               BSA:           1.661 m Patient Age:    53 years               BP:           203/112 mmHg Patient Gender: M                      HR:           76 bpm. Exam Location:  Inpatient  Procedure: 2D Echo (Both Spectral and Color Flow Doppler were utilized during procedure).  Indications:    syncope  History:        Patient has prior history of Echocardiogram examinations, most recent 02/16/2022. Arrythmias:WPW; Risk Factors:Hypertension, Current Smoker and polysubstance abuse.  Sonographer:    Tinnie Barefoot RDCS Referring Phys: [id number] MARSA NOVAK MELVIN  IMPRESSIONS   1. Left ventricular ejection fraction, by estimation, is 70 to 75%. The left ventricle has hyperdynamic function. The left ventricle has no regional wall motion abnormalities. Left ventricular diastolic parameters are consistent with Grade I diastolic dysfunction (impaired relaxation). 2. Right ventricular systolic function is normal. The right ventricular size is normal. There is mildly elevated pulmonary artery systolic pressure. The estimated right ventricular systolic pressure is 44.0 mmHg. 3. The mitral valve is normal in structure. No evidence of mitral valve  regurgitation. No evidence of mitral stenosis. 4. The aortic valve has an indeterminant number of cusps. Aortic valve regurgitation is not visualized. No aortic stenosis is present. 5. The inferior vena cava is normal in size with <50% respiratory variability, suggesting right atrial pressure of 8 mmHg.  Comparison(s): No significant change from prior study.  FINDINGS Left Ventricle: Left ventricular ejection fraction, by estimation, is 70 to 75%. The left ventricle has hyperdynamic function. The left ventricle has no regional wall motion abnormalities. Strain was performed and the global longitudinal strain is indeterminate. The left ventricular internal cavity size was normal in size. There is no left ventricular hypertrophy. Left ventricular diastolic parameters are  consistent with Grade I diastolic dysfunction (impaired relaxation).  Right Ventricle: The right ventricular size is normal. No increase in right ventricular wall thickness. Right ventricular systolic function is normal. There is mildly elevated pulmonary artery systolic pressure. The tricuspid regurgitant velocity is 3.00 m/s, and with an assumed right atrial pressure of 8 mmHg, the estimated right ventricular systolic pressure is 44.0 mmHg.  Left Atrium: Left atrial size was normal in size.  Right Atrium: Right atrial size was normal in size.  Pericardium: There is no evidence of pericardial effusion.  Mitral Valve: The mitral valve is normal in structure. No evidence of mitral valve regurgitation. No evidence of mitral valve stenosis.  Tricuspid Valve: The tricuspid valve is normal in structure. Tricuspid valve regurgitation is trivial. No evidence of tricuspid stenosis.  Aortic Valve: The aortic valve has an indeterminant number of cusps. Aortic valve regurgitation is not visualized. No aortic stenosis is present.  Pulmonic Valve: The pulmonic valve was not well visualized. Pulmonic valve regurgitation is not visualized. No evidence of pulmonic stenosis.  Aorta: The aortic root and ascending aorta are structurally normal, with no evidence of dilitation.  Venous: The inferior vena cava is normal in size with less than 50% respiratory variability, suggesting right atrial pressure of 8 mmHg.  IAS/Shunts: No atrial level shunt detected by color flow Doppler.  Additional Comments: 3D was performed not requiring image post processing on an independent workstation and was indeterminate.   LEFT VENTRICLE PLAX 2D LVIDd:         4.40 cm   Diastology LVIDs:         2.60 cm   LV e' medial:    11.00 cm/s LV PW:         0.80 cm   LV E/e' medial:  11.1 LV IVS:        0.60 cm   LV e' lateral:   12.60 cm/s LVOT diam:     1.90 cm   LV E/e' lateral: 9.7 LV SV:         58 LV SV Index:   35 LVOT  Area:     2.84 cm   RIGHT VENTRICLE             IVC RV Basal diam:  2.50 cm     IVC diam: 2.00 cm RV S prime:     14.80 cm/s TAPSE (M-mode): 1.8 cm  LEFT ATRIUM             Index        RIGHT ATRIUM          Index LA diam:        3.00 cm 1.81 cm/m   RA Area:     9.63 cm LA Vol (A2C):   37.3 ml 22.46 ml/m  RA Volume:   19.90  ml 11.98 ml/m LA Vol (A4C):   31.1 ml 18.73 ml/m LA Biplane Vol: 34.4 ml 20.72 ml/m AORTIC VALVE LVOT Vmax:   117.00 cm/s LVOT Vmean:  77.300 cm/s LVOT VTI:    0.206 m  AORTA Ao Root diam: 2.70 cm Ao Asc diam:  2.60 cm  MITRAL VALVE                TRICUSPID VALVE MV Area (PHT): 5.02 cm     TR Peak grad:   36.0 mmHg MV Decel Time: 151 msec     TR Vmax:        300.00 cm/s MV E velocity: 122.00 cm/s MV A velocity: 96.30 cm/s   SHUNTS MV E/A ratio:  1.27         Systemic VTI:  0.21 m Systemic Diam: 1.90 cm  Vishnu Priya Mallipeddi Electronically signed by Diannah Late Mallipeddi Signature Date/Time: 11/10/2023/4:55:23 PM    Final    MONITORS  LONG TERM MONITOR-LIVE TELEMETRY (3-14 DAYS) 12/12/2023  Narrative Rhythm was normal sinus. There were occasional runs of supraventricular tachycardia with the longest lasting 7 beats. There was some wide-complex tachycardia which could have been SVT aberrancy.  However, could not exclude nonsustained ventricular tachycardia lasting 7 beats.       ______________________________________________________________________________________________       Current Reported Medications:.    No outpatient medications have been marked as taking for the 03/21/24 encounter (Appointment) with Lesleyann Fichter D, NP.    Physical Exam:    VS:  There were no vitals taken for this visit.   Wt Readings from Last 3 Encounters:  02/20/24 118 lb (53.5 kg)  02/04/24 118 lb (53.5 kg)  01/20/24 123 lb 7.3 oz (56 kg)    GEN: Well nourished, well developed in no acute distress NECK: No JVD; No carotid bruits CARDIAC:  ***RRR, no murmurs, rubs, gallops RESPIRATORY:  Clear to auscultation without rales, wheezing or rhonchi  ABDOMEN: Soft, non-tender, non-distended EXTREMITIES:  No edema; No acute deformity     Asessement and Plan:.     ***     Disposition: F/u with ***  Signed, Mistey Hoffert D Carletha Dawn, NP  ?WPW s/p ablation (per patient report), substance use, alcohol  abuse and alcoholic pancreatitis.  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  Per patient's prior reports he underwent WPW ablation in 2014/2015 although unclear as to where, patient has previously reported this took place at [facility] however there is no documentation of this procedure.  Nuclear stress test on 01/12/2022 findings consistent with no prior ischemia, study was considered intermediate risk as LVEF function was abnormal, EF 47%.  Follow-up limited echo on 02/16/2022 indicated LVEF 65 to 70%.  No RWMA, mild LVH.  Per notes patient also has history of paroxysmal atrial fibrillation however has not been started anticoagulation due to low CHA2DS2-VASc and ongoing alcohol  use.  On 11/10/2023 cardiology visual insulted after patient presented to the ED.  Per notes patient was walking in his house when he suddenly collapsed and passed out, denied any warning signs of dizziness or palpitations, unknown duration of syncope.  After he woke up he called 9 1 and was told to call again again if he had any issues.  Patient started having chest pain, stent pain and dizziness, called 911 again and was brought to the ER.  EKG showed short PR interval, sinus tachycardia, HR 100 bpm, no ischemia, hide since  troponins were normal.  BP upon arrival was around 200 mmHg however patient reported readings between 120-130 mmHg at home.  It was questioned if patient carry diagnosis of WPW, noted that there was no record of an ablation completed at [facility], recommended live Zio  patch for 2 weeks on discharge, patient was transition from metoprolol  to carvedilol  given history of cocaine use.  Echocardiogram on 11/10/2023 indicated LVEF of 70 to 75%, no RWMA, G1 DD, RV systolic function and size was normal, mildly elevated pulmonary artery systolic pressures, no significant valvular abnormalities.  Patient was discharged on 11/13/2023 with cardiac monitor.  Patient was seen in the ED on 7/11 for evaluation of chest pain, epigastric abdominal discomfort, nausea.  High sensitive troponin negative x 2.  Lipase normal.  He was started on sucralfate  and discharged in the ED.  Patient again admitted 7/15 through 7/17 after he presented with chest pain and abdominal pain.  Patient also reported diarrhea and vomiting, patient reported feeling as though he had recurrent pancreatitis.  Hs troponin negative.  Patient had relief with GI cocktail.  Patient seen in the ED on 7/22 with abdominal pain, felt like his pancreatitis pain.  EKG nonischemic.  High sensitive troponin negative x 2.  Suspected that he had an exacerbation of his chronic pancreatitis/chronic pain.  He was given 10 tablets of Percocet, per notes he has previously been discharged from [facility] pain management due to positive drug screens.  Patient seen in the ED on 7/26 with right scapular pain.  EKG was without acute ischemia.  Troponin negative.  Presented to the ED 7/27 after he had an episode of vomiting and developed chest pain after vomiting.  EMS reported that they found him in A-fib with RVR, heart rate in the 190s to 220s.  Was given amiodarone  with improvement in heart rate.  In the ED initial vital signs showed heart rate 155 bpm, oxygen  100% on room air, BP 121/91.  Initial EKG showed atrial fibrillation with heart rate 157 bpm.  Labs significant for K3.4, mag 1.1, hemoglobin 10.9.  Hstn 6>27. CXR showed active disease.  Patient was given IV procainamide , IV magnesium .  He converted to normal sinus rhythm and  cardiology was consulted.  Patient reported that he had been feeling nauseous the prior day, he tried to eat something and his nausea worsened then started to vomit.  He developed palpitations and a squeezing feeling in his chest.  Patient was afraid he would pass out if he did not call EMS.  He converted to normal sinus rhythm while in the ED.  Following that he had one-two episodes of palpitations overnight.  Patient continued to have some dizziness when he was moving his head or standing up too quickly.  Patient continued to endorse the squeezing feeling in his chest, was reproducible on palpation, patient also reported that it worsened if he ate or would take deep breaths.

## 2024-03-21 ENCOUNTER — Ambulatory Visit: Payer: MEDICAID | Attending: Cardiology | Admitting: Cardiology

## 2024-03-26 ENCOUNTER — Emergency Department (HOSPITAL_COMMUNITY)
Admission: EM | Admit: 2024-03-26 | Discharge: 2024-03-26 | Disposition: A | Discharge status: Home / Self Care | Payer: MEDICAID | Attending: Emergency Medicine | Admitting: Emergency Medicine

## 2024-03-26 ENCOUNTER — Other Ambulatory Visit: Payer: Self-pay

## 2024-03-26 ENCOUNTER — Emergency Department (HOSPITAL_COMMUNITY): Payer: MEDICAID

## 2024-03-26 DIAGNOSIS — F17200 Nicotine dependence, unspecified, uncomplicated: Secondary | ICD-10-CM | POA: Insufficient documentation

## 2024-03-26 DIAGNOSIS — K625 Hemorrhage of anus and rectum: Secondary | ICD-10-CM | POA: Insufficient documentation

## 2024-03-26 DIAGNOSIS — J45909 Unspecified asthma, uncomplicated: Secondary | ICD-10-CM | POA: Insufficient documentation

## 2024-03-26 DIAGNOSIS — I10 Essential (primary) hypertension: Secondary | ICD-10-CM | POA: Diagnosis not present

## 2024-03-26 DIAGNOSIS — R1084 Generalized abdominal pain: Secondary | ICD-10-CM

## 2024-03-26 LAB — CBC
HCT: 36.7 % — ABNORMAL LOW (ref 39.0–52.0)
Hemoglobin: 12 g/dL — ABNORMAL LOW (ref 13.0–17.0)
MCH: 30.5 pg (ref 26.0–34.0)
MCHC: 32.7 g/dL (ref 30.0–36.0)
MCV: 93.1 fL (ref 80.0–100.0)
Platelets: 150 K/uL (ref 150–400)
RBC: 3.94 MIL/uL — ABNORMAL LOW (ref 4.22–5.81)
RDW: 18.9 % — ABNORMAL HIGH (ref 11.5–15.5)
WBC: 7 K/uL (ref 4.0–10.5)
nRBC: 0 % (ref 0.0–0.2)

## 2024-03-26 LAB — COMPREHENSIVE METABOLIC PANEL WITH GFR
ALT: 40 U/L (ref 0–44)
AST: 103 U/L — ABNORMAL HIGH (ref 15–41)
Albumin: 4 g/dL (ref 3.5–5.0)
Alkaline Phosphatase: 76 U/L (ref 38–126)
Anion gap: 18 — ABNORMAL HIGH (ref 5–15)
BUN: 8 mg/dL (ref 6–20)
CO2: 13 mmol/L — ABNORMAL LOW (ref 22–32)
Calcium: 7.8 mg/dL — ABNORMAL LOW (ref 8.9–10.3)
Chloride: 109 mmol/L (ref 98–111)
Creatinine, Ser: 1.15 mg/dL (ref 0.61–1.24)
GFR, Estimated: >60 mL/min (ref >60)
Glucose, Bld: 114 mg/dL — ABNORMAL HIGH (ref 70–99)
Potassium: 4.7 mmol/L (ref 3.5–5.1)
Sodium: 140 mmol/L (ref 135–145)
Total Bilirubin: 1 mg/dL (ref 0.0–1.2)
Total Protein: 7.4 g/dL (ref 6.5–8.1)

## 2024-03-26 LAB — TYPE AND SCREEN
ABO/RH(D): B POS
Antibody Screen: NEGATIVE

## 2024-03-26 LAB — TROPONIN I (HIGH SENSITIVITY)
Troponin I (High Sensitivity): 5 ng/L (ref <18)
Troponin I (High Sensitivity): 6 ng/L (ref <18)

## 2024-03-26 LAB — POC OCCULT BLOOD, ED: Fecal Occult Bld: NEGATIVE

## 2024-03-26 LAB — ETHANOL: Alcohol, Ethyl (B): <15 mg/dL (ref <15)

## 2024-03-26 LAB — LIPASE, BLOOD: Lipase: 22 U/L (ref 11–51)

## 2024-03-26 MED ORDER — ONDANSETRON 4 MG PO TBDP: 4.0000 mg | ORAL_TABLET | Freq: Three times a day (TID) | ORAL | 0 refills | Status: DC | PRN

## 2024-03-26 MED ORDER — PANTOPRAZOLE SODIUM 40 MG IV SOLR
40.0000 mg | Freq: Once | INTRAVENOUS | Status: AC
  Administered 2024-03-26: 40 mg via INTRAVENOUS
  Filled 2024-03-26: qty 10

## 2024-03-26 MED ORDER — MORPHINE SULFATE (PF) 4 MG/ML IV SOLN
4.0000 mg | Freq: Once | INTRAVENOUS | Status: AC
  Administered 2024-03-26: 4 mg via INTRAVENOUS
  Filled 2024-03-26: qty 1

## 2024-03-26 MED ORDER — ONDANSETRON HCL 4 MG/2ML IJ SOLN
4.0000 mg | Freq: Once | INTRAMUSCULAR | Status: AC
  Administered 2024-03-26: 4 mg via INTRAVENOUS
  Filled 2024-03-26: qty 2

## 2024-03-26 MED ORDER — SENNOSIDES-DOCUSATE SODIUM 8.6-50 MG PO TABS: 1.0000 | ORAL_TABLET | Freq: Every evening | ORAL | 0 refills | Status: AC | PRN

## 2024-03-26 MED ORDER — OXYCODONE HCL 10 MG PO TABS: 10.0000 mg | ORAL_TABLET | Freq: Four times a day (QID) | ORAL | 0 refills | Status: AC | PRN

## 2024-03-26 MED ORDER — LACTATED RINGERS IV BOLUS
1000.0000 mL | Freq: Once | INTRAVENOUS | Status: AC
  Administered 2024-03-26: 1000 mL via INTRAVENOUS

## 2024-03-26 MED ORDER — SODIUM CHLORIDE 0.9 % IV BOLUS
500.0000 mL | Freq: Once | INTRAVENOUS | Status: AC
  Administered 2024-03-26: 500 mL via INTRAVENOUS

## 2024-03-26 NOTE — ED Notes (Signed)
 Went to call patient back to triage for draw of repeat troponin. No response after calling patient's name twice. KIT

## 2024-03-26 NOTE — Discharge Instructions (Addendum)
 You have been seen in the Emergency Department (ED) for abdominal pain.  Your evaluation did not identify a clear cause of your symptoms but was generally reassuring.  Please follow up as instructed above regarding today's emergent visit and the symptoms that are bothering you.  Return to the ED if your abdominal pain worsens or fails to improve, you develop bloody vomiting, bloody diarrhea, you are unable to tolerate fluids due to vomiting, fever greater than 101, or other symptoms that concern you.

## 2024-03-26 NOTE — ED Provider Triage Note (Signed)
 Emergency Medicine Provider Triage Evaluation Note  [name] , a 53 y.o. male  was evaluated in triage.  Pt complains of rectal bleeding that began at 5 AM this morning.  Reports 6 episodes of bright red blood per rectum.  States he thinks he lost a pint of blood.  Also associated with abdominal/chest pain and pancreatitis pain.  Also started this morning.  Denies any alcohol  use.  Nausea and 2 besides vomiting this morning, denies hematemesis.  History of alcohol  abuse, chronic pancreatitis, peptic ulcer disease, paroxysmal atrial fibrillation.  Reports he does not take a blood thinner.  Review of Systems  Positive: +BRBPR, CP/Abd pain, N/V Negative: Hematemesis, urinary sxs, LOC  Physical Exam  BP (!) 174/116 (BP Location: Right Arm)   Pulse 88   Temp 98.4 F (36.9 C) (Oral)   Resp 18   Ht 5' 8 (1.727 m)   Wt 53.5 kg   SpO2 99%   BMI 17.94 kg/m  Gen:   Awake, no distress   Resp:  Normal effort  MSK:   Moves extremities without difficulty  Abdominal: +Epigastric TTP  Medical Decision Making  Medically screening exam initiated at 12:00 PM.  Appropriate orders placed.  [name] was informed that the remainder of the evaluation will be completed by another provider, this initial triage assessment does not replace that evaluation, and the importance of remaining in the ED until their evaluation is complete.  Patient is hypertensive to 174/116 but is otherwise well-appearing.  He is ordered for IV morphine , IV Zofran , IV Protonix  and IV fluids.  He will be moved to treatment space when he comes available.    Franklyn Sid SAILOR, MD 03/26/24 1200

## 2024-03-26 NOTE — ED Provider Notes (Signed)
 Emergency Department Provider Note   I have reviewed the triage vital signs and the nursing notes.   HISTORY  Chief Complaint Chest Pain and Rectal Bleeding   HPI [name] is a 53 y.o. male with past history reviewed below including alcohol  use, pancreatitis, chronic abdominal pain, and history of GI bleeding requiring blood transfusion presents to the emergency department with bright red blood per rectum today.  Patient reports multiple episodes of painless bleeding from below.  No black/tarry stool.  No vomiting blood although he has been vomiting.  He has worsening epigastric to left upper quadrant abdominal pain with eating which remind him of his pancreatitis pain.  Pain radiates to the back and up into the chest somewhat.  No fevers or chills.He is not anticoagulated.    Past Medical History:  Diagnosis Date   Alcoholism /alcohol  abuse    Anemia    Anxiety    Arthritis    knees; arms; elbows (03/26/2015)   Asthma    Bipolar disorder (HCC)    Chronic bronchitis (HCC)    Chronic lower back pain    Chronic pancreatitis (HCC)    Cocaine abuse (HCC)    Depression    Family history of adverse reaction to anesthesia    Femoral condyle fracture (HCC) 03/08/2014   left medial/notes 03/09/2014   GERD (gastroesophageal reflux disease)    H/O hiatal hernia    H/O suicide attempt 10/2012   High cholesterol    History of blood transfusion 10/2012   when I tried to commit suicide   History of stomach ulcers    Hypertension    Marijuana abuse, continuous    Migraine    a few times/year (03/26/2015)   Pancreatitis    Pneumonia 1990's X 3   PTSD (post-traumatic stress disorder)    Seizures (HCC)    Sickle cell trait    WPW (Wolff-Parkinson-White syndrome)    thelbert 03/06/2013    Review of Systems  Constitutional: No fever/chills Cardiovascular: Positive chest pain. Respiratory: Denies shortness of breath. Gastrointestinal: Positive abdominal pain.  Positive nausea and vomiting.  No diarrhea.  Positive BRBPR. No constipation. Skin: Negative for rash. Neurological: Negative for headaches.  ____________________________________________   PHYSICAL EXAM:  VITAL SIGNS: ED Triage Vitals  Encounter Vitals Group     BP 03/26/24 1055 (!) 174/116     Pulse Rate 03/26/24 1055 88     Resp 03/26/24 1055 18     Temp 03/26/24 1055 98.4 F (36.9 C)     Temp Source 03/26/24 1055 Oral     SpO2 03/26/24 1055 99 %     Weight 03/26/24 1114 118 lb (53.5 kg)     Height 03/26/24 1114 5' 8 (1.727 m)   Constitutional: Alert and oriented. Well appearing and in no acute distress. Eyes: Conjunctivae are normal.  Head: Atraumatic. Nose: No congestion/rhinnorhea. Mouth/Throat: Mucous membranes are moist.   Neck: No stridor.  Cardiovascular: Normal rate, regular rhythm. Good peripheral circulation. Grossly normal heart sounds.   Respiratory: Normal respiratory effort.  No retractions. Lungs CTAB. Gastrointestinal: Soft with point tenderness in the LUQ. No peritonitis. No distention.  Nurse chaperone at bedside for rectal exam.  Patient provided verbal consent for the exam.  No external hemorrhoids visualized.  No gross blood or melena on digital rectal exam. Musculoskeletal: No lower extremity tenderness nor edema. No gross deformities of extremities. Neurologic:  Normal speech and language.  Skin:  Skin is warm, dry and intact. No rash noted.  ____________________________________________   LABS (all labs ordered are listed, but only abnormal results are displayed)  Labs Reviewed  COMPREHENSIVE METABOLIC PANEL WITH GFR - Abnormal; Notable for the following components:      Result Value   CO2 13 (*)    Glucose, Bld 114 (*)    Calcium  7.8 (*)    AST 103 (*)    Anion gap 18 (*)    All other components within normal limits  CBC - Abnormal; Notable for the following components:   RBC 3.94 (*)    Hemoglobin 12.0 (*)    HCT 36.7 (*)    RDW 18.9  (*)    All other components within normal limits  LIPASE, BLOOD  ETHANOL  POC OCCULT BLOOD, ED  TYPE AND SCREEN  TROPONIN I (HIGH SENSITIVITY)  TROPONIN I (HIGH SENSITIVITY)   ____________________________________________  EKG   EKG Interpretation Date/Time:  Tuesday March 26 2024 11:35:25 EST Ventricular Rate:  91 PR Interval:  102 QRS Duration:  70 QT Interval:  308 QTC Calculation: 378 R Axis:   49  Text Interpretation: Sinus rhythm with marked sinus arrhythmia with short PR Possible Left atrial enlargement T wave abnormality, consider inferolateral ischemia Similar to prior EKGs Confirmed by Franklyn Gills [phone]) on 03/26/2024 11:42:04 AM        ____________________________________________  RADIOLOGY  CT ABDOMEN PELVIS WO CONTRAST Result Date: 03/26/2024 CLINICAL DATA:  Abdominal pain.  Bloody stools. EXAM: CT ABDOMEN AND PELVIS WITHOUT CONTRAST TECHNIQUE: Multidetector CT imaging of the abdomen and pelvis was performed following the standard protocol without IV contrast. RADIATION DOSE REDUCTION: This exam was performed according to the departmental dose-optimization program which includes automated exposure control, adjustment of the mA and/or kV according to patient size and/or use of iterative reconstruction technique. COMPARISON:  CT abdomen pelvis dated 12/11/2023. FINDINGS: Evaluation of this exam is limited in the absence of intravenous contrast as well as due to respiratory motion. Lower chest: The visualized lung bases are clear. No intra-abdominal free air or free fluid. Hepatobiliary: The liver is unremarkable. No biliary dilatation. The gallbladder is unremarkable Pancreas: Coarse pancreatic calcifications sequela of chronic pancreatitis. A 4.5 x 3.9 cm cystic structure along the distal pancreas as seen previously most likely a pseudocyst. Attention on follow-up imaging recommended. Spleen: Normal in size without focal abnormality. Adrenals/Urinary Tract: The  adrenal glands are unremarkable. There is no hydronephrosis or nephrolithiasis on either side. The visualized ureters and urinary bladder unremarkable. Stomach/Bowel: Evaluation of the bowel is limited in the absence of oral contrast. Diffuse thickened appearance of the stomach as seen on the prior CT. Although this may be related to underdistention, underlying infiltrative process or gastritis is not excluded. Endoscopy may provide better evaluation. There is no bowel obstruction. The appendix is normal. Vascular/Lymphatic: Moderate aortoiliac atherosclerotic disease. The IVC is unremarkable. No portal gas. There is no adenopathy. Reproductive: The prostate and seminal vesicles are grossly unremarkable. No pelvic mass. Other: None Musculoskeletal: Degenerative changes at L5-S1. No acute osseous pathology. IMPRESSION: 1. No acute intra-abdominal or pelvic pathology. 2. Persistent diffuse thickened appearance of the stomach may be related to underdistention or represent an infiltrative/inflammatory process. Endoscopy may provide better evaluation. No bowel obstruction. Normal appendix. 3. Sequela of chronic pancreatitis and pseudocyst. Attention on follow-up imaging recommended. 4.  Aortic Atherosclerosis (ICD10-I70.0). Electronically Signed   By: Vanetta Chou M.D.   On: 03/26/2024 16:55   DG Chest 2 View Result Date: 03/26/2024 EXAM: 2 VIEW(S) XRAY OF THE CHEST 03/26/2024 11:48:00  AM COMPARISON: 02/20/2024 CLINICAL HISTORY: chest pain FINDINGS: LUNGS AND PLEURA: No focal pulmonary opacity. No pleural effusion. No pneumothorax. HEART AND MEDIASTINUM: No acute abnormality of the cardiac and mediastinal silhouettes. BONES AND SOFT TISSUES: No acute osseous abnormality. IMPRESSION: 1. No acute findings. Electronically signed by: Evalene Coho MD 03/26/2024 12:03 PM EST RP Workstation: [id number]    ____________________________________________   PROCEDURES  Procedure(s) performed:    Procedures  None  ____________________________________________   INITIAL IMPRESSION / ASSESSMENT AND PLAN / ED COURSE  Pertinent labs & imaging results that were available during my care of the patient were reviewed by me and considered in my medical decision making (see chart for details).   This patient is Presenting for Evaluation of abdominal pain, which does require a range of treatment options, and is a complaint that involves a high risk of morbidity and mortality.  The Differential Diagnoses includes but is not exclusive to acute cholecystitis, intrathoracic causes for epigastric abdominal pain, gastritis, duodenitis, pancreatitis, small bowel or large bowel obstruction, abdominal aortic aneurysm, hernia, gastritis, etc.   Critical Interventions-    Medications  ondansetron  (ZOFRAN ) injection 4 mg (4 mg Intravenous Given 03/26/24 1211)  lactated ringers  bolus 1,000 mL (0 mLs Intravenous Stopped 03/26/24 1621)  morphine  (PF) 4 MG/ML injection 4 mg (4 mg Intravenous Given 03/26/24 1211)  pantoprazole  (PROTONIX ) injection 40 mg (40 mg Intravenous Given 03/26/24 1212)  sodium chloride  0.9 % bolus 500 mL (500 mLs Intravenous New Bag/Given 03/26/24 1628)  morphine  (PF) 4 MG/ML injection 4 mg (4 mg Intravenous Given 03/26/24 1628)    Reassessment after intervention: pain improved.    Clinical Laboratory Tests Ordered, included CBC without leukocytosis.  Hemoglobin actually increasing from prior values in October now at 12.0.  No acute kidney injury.  Bilirubin normal.  Troponin normal. EtOH negative. Fecal occult negative.   Radiologic Tests Ordered, included CXR and CT abdomen/pelvis. I independently interpreted the images and agree with radiology interpretation.   Cardiac Monitor Tracing which shows NSR.    Social Determinants of Health Risk patient is a smoker.   Medical Decision Making: Summary:  The patient presents emergency department with abdominal discomfort  consistent with his pancreatitis with some associated nonbloody emesis.  Also describing bright red blood per rectum this morning.  On my exam I do not visualize any gross blood or melena.  Fecal occult negative.  Plan for Noncon CT abdomen pelvis and reassess after additional meds.   Reevaluation with update and discussion with patient.  No acute findings on CT abdomen pelvis.  Patient is already taking PPI, simethicone , Pepcid .  He has GI follow-up next Wednesday.  I will provide a small number of pain medications along with a refill of his Zofran .  I did check PDMP prior to providing this small number of tablets.  Considered admission but patient is doing well with improvement in his abdominal pain.  He is tolerating PO and stable for discharge. No breakthrough bleeding.   Patient's presentation is most consistent with acute presentation with potential threat to life or bodily function.   Disposition: discharge  ____________________________________________  FINAL CLINICAL IMPRESSION(S) / ED DIAGNOSES  Final diagnoses:  Rectal bleeding  Generalized abdominal pain     NEW OUTPATIENT MEDICATIONS STARTED DURING THIS VISIT:  New Prescriptions   ONDANSETRON  (ZOFRAN -ODT) 4 MG DISINTEGRATING TABLET    Take 1 tablet (4 mg total) by mouth every 8 (eight) hours as needed.   SENNA-DOCUSATE (SENOKOT-S) 8.6-50 MG TABLET    Take 1  tablet by mouth at bedtime as needed for mild constipation.    Note:  This document was prepared using Dragon voice recognition software and may include unintentional dictation errors.  Fonda Law, MD, [facility]    Renardo Cheatum, Fonda MATSU, MD 03/26/24 1754

## 2024-03-26 NOTE — ED Triage Notes (Signed)
 Pt bib ems from home c.o chest pain and blood in stool.

## 2024-03-26 NOTE — ED Notes (Signed)
 Pt complained of abd pain, but was found eating peanut butter he had in his bag.

## 2024-04-01 NOTE — Progress Notes (Deleted)
 Cardiology Office Note:   Date:  04/01/2024  ID:  [name], DOB 1971-03-22, MRN [id number] PCP: Myra Wanda LABOR, MD  [facility] Providers Cardiologist:  Lynwood Schilling, MD Cardiology APP:  Madie Jon Garre, PA {  History of Present Illness:   [name] is a 53 y.o. male who presents wiith a hx of WPW s/p ablation, substance use, alcohol  abuse, and alcoholic pancreatitis.  Echocardiogram 2022 with mild concentric LVH.  He has been seen in the ER several times for chest and abdominal pain felt related to chronic pancreatitis.  No coronary disease by heart cath in 2014.  EKG in 2014 with preexcitation ST segment changes consistent with WPW.  Per patient report, he states he underwent WPW ablation at: 2014 or 2015.  We saw him because of tachycardia. He reports having rapid heart rate sometimes in the 190s but mostly 135.  He was recently hospitalized at 12/06/2021 with chest and abdominal pain found to be in A-fib RVR.  He converted spontaneously.  Telemetry notable for some aberrant conduction.  Of note, he had been out of his Toprol  for about a month prior to this evaluation.  Toprol  was restarted at 100 mg daily.  He does have a history of cocaine use but was tolerating this beta-blocker in the past and is working on negative drug screens to get into pain clinic for pancreatitis.  Given his low CHA2DS2-VASc score, decision was made to not anticoagulate.  Echocardiogram with preserved EF and no significant valvular disease.  He presents in the office in August after recurrent pancreatitis.   Because of pain and the atrial fib he had at that admission  arranged for NM stress myoview . For palpitation, he was placed on heart monitor and told to take additional metoprolol . NM stress myoview  from 01/12/22 showed intermediate risk study with no ischemia, LVEF 47% with global hypokinesis.  He was back in the hospital ED with chest pain and palpitatoins.  He had tachycardia  and was treated with adenosine by EMS.  Trigger seems to be alcohol  and/or abdominal pain and recurrent pancreatitis.  I was going to have him see the EP physicians during that admission but he left the hospital AMA.     We last saw him in August after he had syncope.   He was int he ED.  He was very hypertensive when he presented. Echocardiogram on 11/10/2023 indicated LVEF of 70 to 75%, no RWMA, G1 DD, RV systolic function and size was normal, mildly elevated pulmonary artery systolic pressures, no significant valvular abnormalities.  Patient was discharged on 11/13/2023 with cardiac monitor.  He has had many ED presentations for chest pain thought to be non anginal.  He was last hospitalized in Nov with chest pain and found to have pancreatitis with alcohol  withdrawal.  He had had presentation prior to this for atrial fib with RVR but was not an anticoagulation candidate.    ***      ***Since leaving the hospital he  has noticed his heart rate to be elevated still.  It looks like he is persistently in fibrillation.  He has been taking his medications per his report.  He still gets chest discomfort and he points to a focal area of discomfort under his left breast.  It is not tender to palpation but it does worsen with certain movements or inspiration.  He is asking about anxiety medicines or pain medications.  He is not having any new shortness of breath, PND  or orthopnea.  Is not having any new palpitations, presyncope or syncope.  Unfortunately he still drinking some alcohol .  He smokes and he does vape.    ROS: ***  Studies Reviewed:    EKG:       ***  Risk Assessment/Calculations:   {Does this patient have ATRIAL FIBRILLATION?:[phone]} No BP recorded.  {Refresh Note OR Click here to enter BP  :1}***        Physical Exam:   VS:  There were no vitals taken for this visit.   Wt Readings from Last 3 Encounters:  03/26/24 118 lb (53.5 kg)  02/20/24 118 lb (53.5 kg)  02/04/24 118 lb (53.5  kg)     GEN: Well nourished, well developed in no acute distress NECK: No JVD; No carotid bruits CARDIAC: ***RR, *** murmurs, rubs, gallops RESPIRATORY:  Clear to auscultation without rales, wheezing or rhonchi  ABDOMEN: Soft, non-tender, non-distended EXTREMITIES:  No edema; No deformity   ASSESSMENT AND PLAN:   Chest pain:   He had a negative perfusion study in October 2025. ***  This has not anginal features.  He had negative enzymes when he was in the emergency room complaining of this pain.  He had no high risk findings on his perfusion study.  He had a CT to rule out PE in the past.  At this point no change in therapy other than as below.  This appears to be nonanginal pain.  He has follow-up with his pain clinic.    A-fib with RVR: ***   I am going to increase his metoprolol  to 200 mg daily.  CHA2DS2-VASc is 1   History of WPW s/p ablation:  ***  He has had no real trend tachycardia it seems like he is persistently in fibrillation.     Hypertension: Blood pressure ***  will be controlled in the context of managing his rate.   Pulmonary nodule:    This was not mentioned on the CT in the hospital in October.  Further screening or follow up per his PCP.  ***     Follow up ***  Signed, Lynwood Schilling, MD

## 2024-04-02 ENCOUNTER — Other Ambulatory Visit: Payer: Self-pay

## 2024-04-02 ENCOUNTER — Emergency Department (HOSPITAL_COMMUNITY): Payer: MEDICAID

## 2024-04-02 ENCOUNTER — Emergency Department (HOSPITAL_COMMUNITY)
Admission: EM | Admit: 2024-04-02 | Discharge: 2024-04-03 | Disposition: A | Discharge status: Home / Self Care | Payer: MEDICAID | Attending: Emergency Medicine | Admitting: Emergency Medicine

## 2024-04-02 DIAGNOSIS — R079 Chest pain, unspecified: Secondary | ICD-10-CM | POA: Diagnosis present

## 2024-04-02 DIAGNOSIS — D649 Anemia, unspecified: Secondary | ICD-10-CM | POA: Diagnosis not present

## 2024-04-02 DIAGNOSIS — Z91040 Latex allergy status: Secondary | ICD-10-CM | POA: Diagnosis not present

## 2024-04-02 DIAGNOSIS — G8929 Other chronic pain: Secondary | ICD-10-CM | POA: Diagnosis not present

## 2024-04-02 DIAGNOSIS — R0789 Other chest pain: Secondary | ICD-10-CM | POA: Insufficient documentation

## 2024-04-02 DIAGNOSIS — D696 Thrombocytopenia, unspecified: Secondary | ICD-10-CM | POA: Diagnosis not present

## 2024-04-02 DIAGNOSIS — J45909 Unspecified asthma, uncomplicated: Secondary | ICD-10-CM | POA: Diagnosis not present

## 2024-04-02 DIAGNOSIS — R1084 Generalized abdominal pain: Secondary | ICD-10-CM | POA: Insufficient documentation

## 2024-04-02 LAB — BASIC METABOLIC PANEL WITH GFR
Anion gap: 15 (ref 5–15)
BUN: 5 mg/dL — ABNORMAL LOW (ref 6–20)
CO2: 16 mmol/L — ABNORMAL LOW (ref 22–32)
Calcium: 6.9 mg/dL — ABNORMAL LOW (ref 8.9–10.3)
Chloride: 110 mmol/L (ref 98–111)
Creatinine, Ser: 0.9 mg/dL (ref 0.61–1.24)
GFR, Estimated: >60 mL/min (ref >60)
Glucose, Bld: 94 mg/dL (ref 70–99)
Potassium: 3.8 mmol/L (ref 3.5–5.1)
Sodium: 141 mmol/L (ref 135–145)

## 2024-04-02 LAB — CBC
HCT: 35.1 % — ABNORMAL LOW (ref 39.0–52.0)
Hemoglobin: 11.3 g/dL — ABNORMAL LOW (ref 13.0–17.0)
MCH: 30.1 pg (ref 26.0–34.0)
MCHC: 32.2 g/dL (ref 30.0–36.0)
MCV: 93.6 fL (ref 80.0–100.0)
Platelets: 111 K/uL — ABNORMAL LOW (ref 150–400)
RBC: 3.75 MIL/uL — ABNORMAL LOW (ref 4.22–5.81)
RDW: 19.7 % — ABNORMAL HIGH (ref 11.5–15.5)
WBC: 6.7 K/uL (ref 4.0–10.5)
nRBC: 0 % (ref 0.0–0.2)

## 2024-04-02 LAB — TROPONIN I (HIGH SENSITIVITY)
Troponin I (High Sensitivity): 6 ng/L (ref <18)
Troponin I (High Sensitivity): 7 ng/L (ref <18)

## 2024-04-02 MED ORDER — SODIUM CHLORIDE 0.9 % IV BOLUS
1000.0000 mL | Freq: Once | INTRAVENOUS | Status: AC
  Administered 2024-04-02: 1000 mL via INTRAVENOUS

## 2024-04-02 MED ORDER — DROPERIDOL 2.5 MG/ML IJ SOLN
2.5000 mg | Freq: Once | INTRAMUSCULAR | Status: AC
  Administered 2024-04-02: 2.5 mg via INTRAVENOUS
  Filled 2024-04-02: qty 2

## 2024-04-02 MED ORDER — MORPHINE SULFATE (PF) 4 MG/ML IV SOLN
4.0000 mg | Freq: Once | INTRAVENOUS | Status: AC
  Administered 2024-04-02: 4 mg via INTRAVENOUS
  Filled 2024-04-02: qty 1

## 2024-04-02 NOTE — ED Provider Notes (Signed)
 [place] EMERGENCY DEPARTMENT AT [facility] Provider Note   CSN: [id number] Arrival date & time: 04/02/24  8196     Patient presents with: Chest Pain   [name] is a 53 y.o. male asthma, cocaine abuse, [employer], GERD, chronic pancreatitis presents with chronic abdominal pain, chronic chest pain.  Reports nausea and vomiting for the last 1-1/2 days, cannot keep anything down.  He is taking his home sucralfate , oxycodone  10 mg, famotidine , Protonix , Zofran .  He reports his ODT Zofran  gives him some relief, but then the nausea comes back after just a few minutes.    Chest Pain      Prior to Admission medications   Medication Sig Start Date End Date Taking? Authorizing Provider  acetaminophen  (TYLENOL ) 500 MG tablet Take 1,000 mg by mouth daily as needed for mild pain (pain score 1-3) or moderate pain (pain score 4-6).    [provider]  calcium  carbonate (TUMS - DOSED IN MG ELEMENTAL CALCIUM ) 500 MG chewable tablet Chew 2 tablets (400 mg of elemental calcium  total) by mouth 3 (three) times daily as needed for indigestion or heartburn. 03/01/24   Theophilus Pagan, MD  carvedilol  (COREG ) 25 MG tablet Take 1 tablet (25 mg total) by mouth 2 (two) times daily with a meal. 12/08/23   West, Katlyn D, NP  diltiazem  (CARDIZEM  CD) 240 MG 24 hr capsule Take 1 capsule (240 mg total) by mouth 2 (two) times daily. 03/01/24   Theophilus Pagan, MD  famotidine  (PEPCID ) 40 MG tablet Take 1 tablet (40 mg total) by mouth daily. Patient taking differently: Take 40 mg by mouth 2 (two) times daily. 10/17/23   Henderly, Britni A, PA-C  feeding supplement (ENSURE PLUS HIGH PROTEIN) LIQD Take 237 mLs by mouth 3 (three) times daily between meals. 03/01/24   Theophilus Pagan, MD  ferrous sulfate  325 (65 FE) MG tablet Take 1 tablet (325 mg total) by mouth daily with breakfast. 03/01/24   Theophilus Pagan, MD  folic acid  (FOLVITE ) 1 MG tablet Take 1 tablet (1 mg total)  by mouth daily. 03/01/24   Theophilus Pagan, MD  lipase/protease/amylase (CREON ) 36000 UNITS CPEP capsule Take 3 capsules (108,000 Units total) by mouth 3 (three) times daily with meals. May also take 1 capsule (36,000 Units total) as needed (with snacks). 03/01/24   Theophilus Pagan, MD  loperamide  (IMODIUM  A-D) 2 MG tablet Take 2 mg by mouth 4 (four) times daily as needed for diarrhea or loose stools.    [provider]  magnesium  oxide (MAG-OX) 400 (240 Mg) MG tablet Take 1 tablet (400 mg total) by mouth daily. 11/07/23   Hoy Nidia FALCON, PA-C  Multiple Vitamin (MULTIVITAMIN WITH MINERALS) TABS tablet Take 1 tablet by mouth daily. Patient taking differently: Take 2 tablets by mouth daily. 08/21/23   Ghimire, Donalda HERO, MD  naloxone  (NARCAN ) nasal spray 4 mg/0.1 mL Place 1 spray into the nose daily as needed (Opioid overdose). 03/01/24   Theophilus Pagan, MD  nicotine  (NICODERM CQ  - DOSED IN MG/24 HOURS) 21 mg/24hr patch Place 1 patch (21 mg total) onto the skin daily. Patient taking differently: Place 21 mg onto the skin daily as needed (for smoking cessation). 11/14/23   Amin, Ankit C, MD  nicotine  polacrilex (NICORETTE ) 2 MG gum Take 1 each (2 mg total) by mouth as needed for smoking cessation. 03/01/24   Theophilus Pagan, MD  ondansetron  (ZOFRAN -ODT) 4 MG disintegrating tablet Take 1 tablet (4 mg total) by mouth every 8 (  eight) hours as needed. 03/26/24   Long, Fonda MATSU, MD  pantoprazole  (PROTONIX ) 40 MG tablet Take 1 tablet (40 mg total) by mouth daily. 01/18/24   Kehrli, Kelsey F, PA-C  polyethylene glycol powder (GLYCOLAX /MIRALAX ) 17 GM/SCOOP powder Take 17 g by mouth daily as needed for mild constipation. 11/13/23   Amin, Ankit C, MD  senna-docusate (SENOKOT-S) 8.6-50 MG tablet Take 1 tablet by mouth at bedtime as needed for mild constipation. 03/26/24   Long, Fonda MATSU, MD  sucralfate  (CARAFATE ) 1 GM/10ML suspension Take 1 g by mouth 4 (four) times daily -  with meals and at bedtime.     [provider]  tamsulosin  (FLOMAX ) 0.4 MG CAPS capsule Take 1 capsule (0.4 mg total) by mouth daily. 03/01/24   Theophilus Pagan, MD  thiamine  (VITAMIN B-1) 100 MG tablet Take 1 tablet (100 mg total) by mouth daily. 03/01/24   Theophilus Pagan, MD  amitriptyline  (ELAVIL ) 25 MG tablet Take 1 tablet (25 mg total) by mouth at bedtime. Patient not taking: Reported on 08/08/2019 10/15/18 08/08/19  Tobie Yetta HERO, MD    Allergies: Robaxin  [methocarbamol ], Bayer aspirin  [aspirin ], Trazodone  and nefazodone, Adhesive [tape], Latex, Toradol  [ketorolac  tromethamine ], Contrast media [iodinated contrast media], Fish allergy, Reglan  [metoclopramide ], Salmon [fish oil], and Shellfish allergy    Review of Systems  Cardiovascular:  Positive for chest pain.  All other systems reviewed and are negative.   Updated Vital Signs BP (!) 187/122   Pulse 79   Temp 97.6 F (36.4 C) (Oral)   Resp 18   Ht 5' 8 (1.727 m)   Wt 53.5 kg   SpO2 100%   BMI 17.94 kg/m   Physical Exam Vitals and nursing note reviewed.  Constitutional:      General: He is not in acute distress.    Appearance: Normal appearance.  HENT:     Head: Normocephalic and atraumatic.  Eyes:     General:        Right eye: No discharge.        Left eye: No discharge.  Cardiovascular:     Rate and Rhythm: Normal rate and regular rhythm.     Heart sounds: No murmur heard.    No friction rub. No gallop.  Pulmonary:     Effort: Pulmonary effort is normal.     Breath sounds: Normal breath sounds.     Comments: No wheezing, rhonchi, stridor, rales Chest:     Comments: Some tenderness to palpation of lower chest wall, no step-off, deformity noted Abdominal:     General: Bowel sounds are normal.     Palpations: Abdomen is soft.     Comments: Diffuse tenderness throughout the abdomen with no rebound, rigidity, guarding, nondistended, nonperitonitic appearing  Skin:    General: Skin is warm and dry.     Capillary Refill: Capillary  refill takes less than 2 seconds.  Neurological:     Mental Status: He is alert and oriented to person, place, and time.  Psychiatric:        Mood and Affect: Mood normal.        Behavior: Behavior normal.     (all labs ordered are listed, but only abnormal results are displayed) Labs Reviewed  BASIC METABOLIC PANEL WITH GFR - Abnormal; Notable for the following components:      Result Value   CO2 16 (*)    BUN 5 (*)    Calcium  6.9 (*)    All other components within normal limits  CBC -  Abnormal; Notable for the following components:   RBC 3.75 (*)    Hemoglobin 11.3 (*)    HCT 35.1 (*)    RDW 19.7 (*)    Platelets 111 (*)    All other components within normal limits  TROPONIN I (HIGH SENSITIVITY)  TROPONIN I (HIGH SENSITIVITY)    EKG: EKG Interpretation Date/Time:  Tuesday April 02 2024 18:14:33 EST Ventricular Rate:  102 PR Interval:  98 QRS Duration:  62 QT Interval:  340 QTC Calculation: 443 R Axis:   52  Text Interpretation: Sinus tachycardia with short PR with Premature supraventricular complexes Right atrial enlargement T wave abnormality, consider inferolateral ischemia Abnormal ECG No STEMI When compared with ECG of 26-Mar-2024 11:35, PREVIOUS ECG IS PRESENT Confirmed by Ula Barter [phone]) on 04/02/2024 6:23:37 PM  Radiology: DG Chest 2 View Result Date: 04/02/2024 EXAM: 2 VIEW(S) XRAY OF THE CHEST 04/02/2024 08:18:21 PM COMPARISON: Comparison with 03/26/2024. CLINICAL HISTORY: CP. FINDINGS: LUNGS AND PLEURA: Emphysematous changes and scattered fibrosis in the lungs. No airspace disease or consolidation. No pleural effusion or pneumothorax. HEART AND MEDIASTINUM: Heart size and pulmonary vascularity are normal. Mediastinal contours appear intact. No acute abnormality of the cardiac and mediastinal silhouettes. BONES AND SOFT TISSUES: No acute osseous abnormality. IMPRESSION: 1. No acute cardiopulmonary pathology. 2. Emphysematous changes and scattered pulmonary  fibrosis, which may relate to the chest pain context. Electronically signed by: Elsie Gravely MD 04/02/2024 08:26 PM EST RP Workstation: [id number]     Procedures   Medications Ordered in the ED  sodium chloride  0.9 % bolus 1,000 mL (1,000 mLs Intravenous New Bag/Given 04/02/24 2255)  droperidol  (INAPSINE ) 2.5 MG/ML injection 2.5 mg (2.5 mg Intravenous Given 04/02/24 2255)  morphine  (PF) 4 MG/ML injection 4 mg (4 mg Intravenous Given 04/02/24 2255)                                    Medical Decision Making Amount and/or Complexity of Data Reviewed Labs: ordered. Radiology: ordered.   This patient is a 53 y.o. male  who presents to the ED for concern of chest pain, abdominal pain.   Differential diagnoses prior to evaluation: The emergent differential diagnosis includes, but is not limited to,  ACS, AAS, PE, Mallory-Weiss, Boerhaave's, Pneumonia, acute bronchitis, asthma or COPD exacerbation, anxiety, MSK pain or traumatic injury to the chest, acid reflux versus other, The causes of generalized abdominal pain include but are not limited to AAA, mesenteric ischemia, appendicitis, diverticulitis, DKA, gastritis, gastroenteritis, AMI, nephrolithiasis, pancreatitis, peritonitis, adrenal insufficiency,lead poisoning, iron toxicity, intestinal ischemia, constipation, UTI,SBO/LBO, splenic rupture, biliary disease, IBD, IBS, PUD, or hepatitis --overall suspect that patient's complaints are chronic in nature, he has a long history of ED visits, has had a very significant number of previous CT scans. This is not an exhaustive differential.   Past Medical History / Co-morbidities / Social History: cocaine abuse, [employer], GERD, chronic pancreatitis presents with chronic abdominal pain, chronic chest pain  Additional history: Chart reviewed. Pertinent results include: Extensively reviewed lab work, imaging from  Physical Exam: Physical exam performed. The pertinent findings  include: Diffuse tenderness throughout the abdomen with no rebound, rigidity, guarding, nondistended, nonperitonitic appearing   Some tenderness to palpation of lower chest wall, no step-off, deformity noted  No wheezing, rhonchi, stridor, rales  Lab Tests/Imaging studies: I personally interpreted labs/imaging and the pertinent results include: CBC with mild anemia, mild thrombocytopenia, not significantly changed  from baseline although his platelet count is slightly lower than normal.  No leukocytosis.  BMP notable for hypocalcemia, calcium  6.9, bicarb deficit, CO2 16.  Normal troponin x 2.  Plain film chest x-ray with some chronic emphysematous changes, no evidence of focal infiltrate. I agree with the radiologist interpretation.  Cardiac monitoring: EKG obtained and interpreted by myself and attending physician which shows: Normal sinus rhythm, no acute changes from previous, no evidence of acute ischemic pathology   Medications: I ordered medication including droperidol , morphine , fluid bolus.  I have reviewed the patients home medicines and have made adjustments as needed.   Disposition: After consideration of the diagnostic results and the patients response to treatment, I feel that patient with chronic chest pain, abdominal pain, stable for discharge after receiving pain medication, nausea medication in the emergency department.   emergency department workup does not suggest an emergent condition requiring admission or immediate intervention beyond what has been performed at this time. The plan is: as above. The patient is safe for discharge and has been instructed to return immediately for worsening symptoms, change in symptoms or any other concerns.   Final diagnoses:  Chronic chest pain  Chronic abdominal pain    ED Discharge Orders     None          Rosan Sherlean VEAR DEVONNA 04/02/24 2315    Melvenia Motto, MD 04/02/24 2329

## 2024-04-02 NOTE — ED Triage Notes (Addendum)
 Patient arrives via [facility] for CP x 30 min ago. 7/10 pain. Left side chest pain. Nausea since last night. No shob. GCS 15. No ASA given as patient endorses his doctor will not allow him to take, no nitroglycerin  due to ED medicine within 36 hr.    EMS vitals BP 132/100 HR 118 afib 98 on room air 20 RR CBG 132

## 2024-04-02 NOTE — Discharge Instructions (Addendum)
 Please continue your home medications, follow-up with your primary care doctor.

## 2024-04-02 NOTE — ED Provider Triage Note (Signed)
 Emergency Medicine Provider Triage Evaluation Note  [name] , a 53 y.o. male  was evaluated in triage.  Pt complains of abd pain. Endorse abd pain and chest pain x 2 days.  Endorse extreme nausea and vomiting, unable to keep any of the medications that was previously prescribed to him after he was d/c from the hospital 3 days ago for alcohol  pancreatitis. Last alcohol  use was 2 days ago.   Review of Systems  Positive: As above Negative: As above  Physical Exam  BP (!) 182/123 (BP Location: Right Arm)   Pulse (!) 104   Temp 98.4 F (36.9 C) (Oral)   Resp (!) 23   Ht 5' 8 (1.727 m)   Wt 53.5 kg   SpO2 100%   BMI 17.94 kg/m  Gen:   Awake, no distress   Resp:  Normal effort  MSK:   Moves extremities without difficulty  Other:    Medical Decision Making  Medically screening exam initiated at 6:53 PM.  Appropriate orders placed.  [name] was informed that the remainder of the evaluation will be completed by another provider, this initial triage assessment does not replace that evaluation, and the importance of remaining in the ED until their evaluation is complete.     Nivia Colon, PA-C 04/02/24 1857

## 2024-04-03 ENCOUNTER — Ambulatory Visit: Payer: MEDICAID | Attending: Internal Medicine | Admitting: Cardiology

## 2024-04-03 DIAGNOSIS — I480 Paroxysmal atrial fibrillation: Secondary | ICD-10-CM

## 2024-04-03 DIAGNOSIS — I10 Essential (primary) hypertension: Secondary | ICD-10-CM

## 2024-04-03 NOTE — ED Notes (Signed)
 Pt verbalized understanding of discharge instructions.

## 2024-04-06 ENCOUNTER — Emergency Department (HOSPITAL_COMMUNITY)
Admission: EM | Admit: 2024-04-06 | Discharge: 2024-04-06 | Disposition: A | Discharge status: Home / Self Care | Payer: Self-pay

## 2024-04-06 ENCOUNTER — Other Ambulatory Visit: Payer: Self-pay

## 2024-04-06 ENCOUNTER — Encounter (HOSPITAL_COMMUNITY): Payer: Self-pay

## 2024-04-06 DIAGNOSIS — R112 Nausea with vomiting, unspecified: Secondary | ICD-10-CM | POA: Insufficient documentation

## 2024-04-06 DIAGNOSIS — R55 Syncope and collapse: Secondary | ICD-10-CM | POA: Insufficient documentation

## 2024-04-06 DIAGNOSIS — R1032 Left lower quadrant pain: Secondary | ICD-10-CM | POA: Insufficient documentation

## 2024-04-06 DIAGNOSIS — Z91040 Latex allergy status: Secondary | ICD-10-CM | POA: Insufficient documentation

## 2024-04-06 DIAGNOSIS — R42 Dizziness and giddiness: Secondary | ICD-10-CM | POA: Insufficient documentation

## 2024-04-06 DIAGNOSIS — M5412 Radiculopathy, cervical region: Secondary | ICD-10-CM | POA: Insufficient documentation

## 2024-04-06 DIAGNOSIS — R1031 Right lower quadrant pain: Secondary | ICD-10-CM | POA: Insufficient documentation

## 2024-04-06 DIAGNOSIS — G8929 Other chronic pain: Secondary | ICD-10-CM | POA: Insufficient documentation

## 2024-04-06 DIAGNOSIS — R110 Nausea: Secondary | ICD-10-CM

## 2024-04-06 LAB — CBC WITH DIFFERENTIAL/PLATELET
Abs Immature Granulocytes: 0.02 K/uL (ref 0.00–0.07)
Basophils Absolute: 0 K/uL (ref 0.0–0.1)
Basophils Relative: 0 %
Eosinophils Absolute: 0.1 K/uL (ref 0.0–0.5)
Eosinophils Relative: 1 %
HCT: 32.7 % — ABNORMAL LOW (ref 39.0–52.0)
Hemoglobin: 10.2 g/dL — ABNORMAL LOW (ref 13.0–17.0)
Immature Granulocytes: 0 %
Lymphocytes Relative: 27 %
Lymphs Abs: 1.9 K/uL (ref 0.7–4.0)
MCH: 29.7 pg (ref 26.0–34.0)
MCHC: 31.2 g/dL (ref 30.0–36.0)
MCV: 95.1 fL (ref 80.0–100.0)
Monocytes Absolute: 1 K/uL (ref 0.1–1.0)
Monocytes Relative: 15 %
Neutro Abs: 3.8 K/uL (ref 1.7–7.7)
Neutrophils Relative %: 57 %
Platelets: 142 K/uL — ABNORMAL LOW (ref 150–400)
RBC: 3.44 MIL/uL — ABNORMAL LOW (ref 4.22–5.81)
RDW: 19.3 % — ABNORMAL HIGH (ref 11.5–15.5)
WBC: 6.8 K/uL (ref 4.0–10.5)
nRBC: 0 % (ref 0.0–0.2)

## 2024-04-06 LAB — HEPATIC FUNCTION PANEL
ALT: 27 U/L (ref 0–44)
AST: 41 U/L (ref 15–41)
Albumin: 3.2 g/dL — ABNORMAL LOW (ref 3.5–5.0)
Alkaline Phosphatase: 69 U/L (ref 38–126)
Bilirubin, Direct: 0.1 mg/dL (ref 0.0–0.2)
Indirect Bilirubin: 0.6 mg/dL (ref 0.3–0.9)
Total Bilirubin: 0.7 mg/dL (ref 0.0–1.2)
Total Protein: 6.3 g/dL — ABNORMAL LOW (ref 6.5–8.1)

## 2024-04-06 LAB — BASIC METABOLIC PANEL WITH GFR
Anion gap: 13 (ref 5–15)
BUN: 8 mg/dL (ref 6–20)
CO2: 14 mmol/L — ABNORMAL LOW (ref 22–32)
Calcium: 6.7 mg/dL — ABNORMAL LOW (ref 8.9–10.3)
Chloride: 115 mmol/L — ABNORMAL HIGH (ref 98–111)
Creatinine, Ser: 0.92 mg/dL (ref 0.61–1.24)
GFR, Estimated: >60 mL/min (ref >60)
Glucose, Bld: 131 mg/dL — ABNORMAL HIGH (ref 70–99)
Potassium: 3.5 mmol/L (ref 3.5–5.1)
Sodium: 142 mmol/L (ref 135–145)

## 2024-04-06 LAB — TROPONIN I (HIGH SENSITIVITY): Troponin I (High Sensitivity): 5 ng/L (ref <18)

## 2024-04-06 LAB — LIPASE, BLOOD: Lipase: 21 U/L (ref 11–51)

## 2024-04-06 MED ORDER — DICYCLOMINE HCL 10 MG/ML IM SOLN
20.0000 mg | Freq: Once | INTRAMUSCULAR | Status: AC
  Administered 2024-04-06: 20 mg via INTRAMUSCULAR
  Filled 2024-04-06: qty 2

## 2024-04-06 MED ORDER — PROMETHAZINE HCL 25 MG PO TABS: 25.0000 mg | ORAL_TABLET | Freq: Three times a day (TID) | ORAL | 0 refills | Status: AC | PRN

## 2024-04-06 MED ORDER — CYCLOBENZAPRINE HCL 10 MG PO TABS: 10.0000 mg | ORAL_TABLET | Freq: Three times a day (TID) | ORAL | 0 refills | Status: AC | PRN

## 2024-04-06 MED ORDER — PROMETHAZINE HCL 25 MG/ML IJ SOLN
12.5000 mg | Freq: Four times a day (QID) | INTRAMUSCULAR | Status: DC | PRN
  Administered 2024-04-06: 12.5 mg via INTRAMUSCULAR
  Filled 2024-04-06: qty 1

## 2024-04-06 NOTE — ED Triage Notes (Addendum)
 This morning pt was dizzy and had a syncopal episode and unsure whether or not he hit his head, denies thinner but pt has Afib and felt like he was in Afib when episode happened. Pt also endorses bilateral arm and leg tingling worsening with movement, sensation equal on both sides. Pt also says SOB and tinnitus, which he has hx of. Pt in and out of Afib with EMS.

## 2024-04-06 NOTE — ED Provider Notes (Signed)
 [place] EMERGENCY DEPARTMENT AT [facility] Provider Note   CSN: [id number] Arrival date & time: 04/06/24  1125     Patient presents with: Dizziness and Loss of Consciousness   [name] is a 53 y.o. male.   53 year old male presents for evaluation of dizziness and loss of consciousness and multiple other complaints.  States he has chronic pancreatitis and he is having abdominal pain.  He is already on Protonix  and Carafate  and is getting follow-up scheduled with GI.  States he feels like his pancreatitis is flared up.  States he has been nauseous in the hide a hard time keeping down his pain meds.  Does admit to having a syncopal episode last night and near syncopal today.  He denies any chest pain or headache.  Denies any other symptoms or concerns.   Dizziness Associated symptoms: nausea, syncope and vomiting   Associated symptoms: no chest pain, no palpitations and no shortness of breath   Loss of Consciousness Associated symptoms: dizziness, nausea and vomiting   Associated symptoms: no chest pain, no fever, no palpitations, no seizures and no shortness of breath        Prior to Admission medications   Medication Sig Start Date End Date Taking? Authorizing Provider  cyclobenzaprine  (FLEXERIL ) 10 MG tablet Take 1 tablet (10 mg total) by mouth 3 (three) times daily as needed for muscle spasms. 04/06/24  Yes Arisa Congleton L, DO  promethazine  (PHENERGAN ) 25 MG tablet Take 1 tablet (25 mg total) by mouth every 8 (eight) hours as needed for up to 4 days for nausea or vomiting. 04/06/24 04/10/24 Yes Rahi Chandonnet L, DO  acetaminophen  (TYLENOL ) 500 MG tablet Take 1,000 mg by mouth daily as needed for mild pain (pain score 1-3) or moderate pain (pain score 4-6).    [provider]  calcium  carbonate (TUMS - DOSED IN MG ELEMENTAL CALCIUM ) 500 MG chewable tablet Chew 2 tablets (400 mg of elemental calcium  total) by mouth 3 (three) times daily as  needed for indigestion or heartburn. 03/01/24   Theophilus Pagan, MD  carvedilol  (COREG ) 25 MG tablet Take 1 tablet (25 mg total) by mouth 2 (two) times daily with a meal. 12/08/23   West, Katlyn D, NP  diltiazem  (CARDIZEM  CD) 240 MG 24 hr capsule Take 1 capsule (240 mg total) by mouth 2 (two) times daily. 03/01/24   Theophilus Pagan, MD  famotidine  (PEPCID ) 40 MG tablet Take 1 tablet (40 mg total) by mouth daily. Patient taking differently: Take 40 mg by mouth 2 (two) times daily. 10/17/23   Henderly, Britni A, PA-C  feeding supplement (ENSURE PLUS HIGH PROTEIN) LIQD Take 237 mLs by mouth 3 (three) times daily between meals. 03/01/24   Theophilus Pagan, MD  ferrous sulfate  325 (65 FE) MG tablet Take 1 tablet (325 mg total) by mouth daily with breakfast. 03/01/24   Theophilus Pagan, MD  folic acid  (FOLVITE ) 1 MG tablet Take 1 tablet (1 mg total) by mouth daily. 03/01/24   Theophilus Pagan, MD  lipase/protease/amylase (CREON ) 36000 UNITS CPEP capsule Take 3 capsules (108,000 Units total) by mouth 3 (three) times daily with meals. May also take 1 capsule (36,000 Units total) as needed (with snacks). 03/01/24   Theophilus Pagan, MD  loperamide  (IMODIUM  A-D) 2 MG tablet Take 2 mg by mouth 4 (four) times daily as needed for diarrhea or loose stools.    [provider]  magnesium  oxide (MAG-OX) 400 (240 Mg) MG tablet Take 1 tablet (400 mg  total) by mouth daily. 11/07/23   Hoy Nidia FALCON, PA-C  Multiple Vitamin (MULTIVITAMIN WITH MINERALS) TABS tablet Take 1 tablet by mouth daily. Patient taking differently: Take 2 tablets by mouth daily. 08/21/23   Ghimire, Donalda HERO, MD  naloxone  (NARCAN ) nasal spray 4 mg/0.1 mL Place 1 spray into the nose daily as needed (Opioid overdose). 03/01/24   Theophilus Pagan, MD  nicotine  (NICODERM CQ  - DOSED IN MG/24 HOURS) 21 mg/24hr patch Place 1 patch (21 mg total) onto the skin daily. Patient taking differently: Place 21 mg onto the skin daily as needed (for smoking  cessation). 11/14/23   Amin, Ankit C, MD  nicotine  polacrilex (NICORETTE ) 2 MG gum Take 1 each (2 mg total) by mouth as needed for smoking cessation. 03/01/24   Theophilus Pagan, MD  ondansetron  (ZOFRAN -ODT) 4 MG disintegrating tablet Take 1 tablet (4 mg total) by mouth every 8 (eight) hours as needed. 03/26/24   Long, Fonda MATSU, MD  pantoprazole  (PROTONIX ) 40 MG tablet Take 1 tablet (40 mg total) by mouth daily. 01/18/24   Kehrli, Kelsey F, PA-C  polyethylene glycol powder (GLYCOLAX /MIRALAX ) 17 GM/SCOOP powder Take 17 g by mouth daily as needed for mild constipation. 11/13/23   Amin, Ankit C, MD  senna-docusate (SENOKOT-S) 8.6-50 MG tablet Take 1 tablet by mouth at bedtime as needed for mild constipation. 03/26/24   Long, Fonda MATSU, MD  sucralfate  (CARAFATE ) 1 GM/10ML suspension Take 1 g by mouth 4 (four) times daily -  with meals and at bedtime.    [provider]  tamsulosin  (FLOMAX ) 0.4 MG CAPS capsule Take 1 capsule (0.4 mg total) by mouth daily. 03/01/24   Theophilus Pagan, MD  thiamine  (VITAMIN B-1) 100 MG tablet Take 1 tablet (100 mg total) by mouth daily. 03/01/24   Theophilus Pagan, MD  amitriptyline  (ELAVIL ) 25 MG tablet Take 1 tablet (25 mg total) by mouth at bedtime. Patient not taking: Reported on 08/08/2019 10/15/18 08/08/19  Tobie Yetta HERO, MD    Allergies: Robaxin  [methocarbamol ], Bayer aspirin  [aspirin ], Trazodone  and nefazodone, Adhesive [tape], Latex, Toradol  [ketorolac  tromethamine ], Contrast media [iodinated contrast media], Fish allergy, Reglan  [metoclopramide ], Salmon [fish oil], and Shellfish allergy    Review of Systems  Constitutional:  Negative for chills and fever.  HENT:  Negative for ear pain and sore throat.   Eyes:  Negative for pain and visual disturbance.  Respiratory:  Negative for cough and shortness of breath.   Cardiovascular:  Positive for syncope. Negative for chest pain and palpitations.  Gastrointestinal:  Positive for abdominal pain, nausea and vomiting.   Genitourinary:  Negative for dysuria and hematuria.  Musculoskeletal:  Negative for arthralgias and back pain.  Skin:  Negative for color change and rash.  Neurological:  Positive for dizziness. Negative for seizures and syncope.  All other systems reviewed and are negative.   Updated Vital Signs BP (!) 131/92 (BP Location: Right Arm)   Pulse 87   Temp 98.2 F (36.8 C) (Oral)   Resp 17   SpO2 100%   Physical Exam Vitals and nursing note reviewed.  Constitutional:      General: He is not in acute distress.    Appearance: Normal appearance. He is well-developed. He is not ill-appearing.  HENT:     Head: Normocephalic and atraumatic.  Eyes:     Conjunctiva/sclera: Conjunctivae normal.  Cardiovascular:     Rate and Rhythm: Normal rate and regular rhythm.     Heart sounds: No murmur heard. Pulmonary:  Effort: Pulmonary effort is normal. No respiratory distress.     Breath sounds: Normal breath sounds.  Abdominal:     General: There is no distension.     Palpations: Abdomen is soft.     Tenderness: There is no abdominal tenderness.  Musculoskeletal:        General: No swelling.     Cervical back: Neck supple.  Skin:    General: Skin is warm and dry.     Capillary Refill: Capillary refill takes less than 2 seconds.  Neurological:     Mental Status: He is alert.  Psychiatric:        Mood and Affect: Mood normal.     (all labs ordered are listed, but only abnormal results are displayed) Labs Reviewed  BASIC METABOLIC PANEL WITH GFR - Abnormal; Notable for the following components:      Result Value   Chloride 115 (*)    CO2 14 (*)    Glucose, Bld 131 (*)    Calcium  6.7 (*)    All other components within normal limits  CBC WITH DIFFERENTIAL/PLATELET - Abnormal; Notable for the following components:   RBC 3.44 (*)    Hemoglobin 10.2 (*)    HCT 32.7 (*)    RDW 19.3 (*)    Platelets 142 (*)    All other components within normal limits  HEPATIC FUNCTION PANEL -  Abnormal; Notable for the following components:   Total Protein 6.3 (*)    Albumin  3.2 (*)    All other components within normal limits  LIPASE, BLOOD  TROPONIN I (HIGH SENSITIVITY)  TROPONIN I (HIGH SENSITIVITY)    EKG: None  Radiology: No results found.   Procedures   Medications Ordered in the ED  promethazine  (PHENERGAN ) injection 12.5 mg (12.5 mg Intramuscular Given 04/06/24 1236)  dicyclomine  (BENTYL ) injection 20 mg (20 mg Intramuscular Given 04/06/24 1235)    Clinical Course as of 04/06/24 1511  Sat Apr 06, 2024  1310 EKG not crossing over into MUSE. Shows sinus rhythm, no STEMI or significant changes when compared to prior EKG  [MK]    Clinical Course User Index [MK] Gennaro Duwaine CROME, DO                                 Medical Decision Making Patient here for chronic abdominal pain and radiculopathy.  Workup negative.  He appears well and stable vitals.  Was given Bentyl  and on reevaluation he does not appear to be in pain, he is sleeping.  Did not want a prescription for Bentyl .  Will give him a short prescription for Flexeril  to use as needed and Phenergan  as he states the Zofran  does not work for his nausea.  He has follow-up scheduled with pain management and already has a prescription for oxycodone .  Advised return for any new or worsening symptoms otherwise follow-up with primary care and his specialist.  He is agreeable with plan for discharge.  Problems Addressed: Abdominal pain, chronic, bilateral lower quadrant: chronic illness or injury with exacerbation, progression, or side effects of treatment Cervical radiculopathy: chronic illness or injury with exacerbation, progression, or side effects of treatment Nausea: acute illness or injury  Amount and/or Complexity of Data Reviewed External Data Reviewed: notes.    Details: Prior ED records reviewed and patient is seen in the ER frequently for chronic pancreatitis Labs: ordered. Decision-making details  documented in ED Course.  Details: Ordered and reviewed by me and unremarkable  Risk OTC drugs. Prescription drug management. Diagnosis or treatment significantly limited by social determinants of health.      Final diagnoses:  Abdominal pain, chronic, bilateral lower quadrant  Cervical radiculopathy  Nausea    ED Discharge Orders          Ordered    cyclobenzaprine  (FLEXERIL ) 10 MG tablet  3 times daily PRN        04/06/24 1352    promethazine  (PHENERGAN ) 25 MG tablet  Every 8 hours PRN        04/06/24 1352               Aarti Mankowski L, DO 04/06/24 1512
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
## 2024-04-06 NOTE — Discharge Instructions (Addendum)
 Follow-up with your specialist and pain management.  You can take your Phenergan  as needed for nausea and vomiting.  You can use your Flexeril  as needed for pain and spasms.  Stay on your other medications as prescribed.

## 2024-04-15 NOTE — Progress Notes (Signed)
 [facility] Care Management Progress Note           Purpose of contact: Referral status   Patient said he has not heard from [facility] (phone: [phone]; fax: [phone]). SW called the clinic and they said patient was previously a patient so they do not need a new referral; at his last office visit he was referred to Dr. Vanice office but that [facility] location has closed. They said [facility] would be patient's best option, they will send a referral to that location now.   SW called patient and updated him. He said he is not sure he will be able to go to [place] as he was dismissed a few years ago due to a urine screen. SW gave him the number to [facility], let him know he is still an active patient with them and can make an appointment, and encouraged him to call them about options.   Patient has SW's contact information and SW encouraged patient to call SW with any questions.  [name], CCM [facility] Manager [facility] [phone]

## 2024-04-16 ENCOUNTER — Emergency Department (HOSPITAL_COMMUNITY): Payer: MEDICAID

## 2024-04-16 ENCOUNTER — Other Ambulatory Visit: Payer: Self-pay

## 2024-04-16 ENCOUNTER — Inpatient Hospital Stay (HOSPITAL_COMMUNITY)
Admission: EM | Admit: 2024-04-16 | Discharge: 2024-04-19 | DRG: 309 | Disposition: A | Discharge status: Home / Self Care | Payer: MEDICAID | Attending: Family Medicine | Admitting: Family Medicine

## 2024-04-16 DIAGNOSIS — Z818 Family history of other mental and behavioral disorders: Secondary | ICD-10-CM

## 2024-04-16 DIAGNOSIS — R569 Unspecified convulsions: Secondary | ICD-10-CM | POA: Diagnosis present

## 2024-04-16 DIAGNOSIS — K279 Peptic ulcer, site unspecified, unspecified as acute or chronic, without hemorrhage or perforation: Secondary | ICD-10-CM | POA: Diagnosis present

## 2024-04-16 DIAGNOSIS — Z91013 Allergy to seafood: Secondary | ICD-10-CM

## 2024-04-16 DIAGNOSIS — I5032 Chronic diastolic (congestive) heart failure: Secondary | ICD-10-CM | POA: Diagnosis present

## 2024-04-16 DIAGNOSIS — Z91040 Latex allergy status: Secondary | ICD-10-CM

## 2024-04-16 DIAGNOSIS — I456 Pre-excitation syndrome: Secondary | ICD-10-CM | POA: Diagnosis present

## 2024-04-16 DIAGNOSIS — F10239 Alcohol dependence with withdrawal, unspecified: Secondary | ICD-10-CM | POA: Diagnosis present

## 2024-04-16 DIAGNOSIS — J4489 Other specified chronic obstructive pulmonary disease: Secondary | ICD-10-CM | POA: Diagnosis present

## 2024-04-16 DIAGNOSIS — I4891 Unspecified atrial fibrillation: Secondary | ICD-10-CM

## 2024-04-16 DIAGNOSIS — Z79899 Other long term (current) drug therapy: Secondary | ICD-10-CM

## 2024-04-16 DIAGNOSIS — Z808 Family history of malignant neoplasm of other organs or systems: Secondary | ICD-10-CM

## 2024-04-16 DIAGNOSIS — Z604 Social exclusion and rejection: Secondary | ICD-10-CM | POA: Diagnosis present

## 2024-04-16 DIAGNOSIS — Z6372 Alcoholism and drug addiction in family: Secondary | ICD-10-CM

## 2024-04-16 DIAGNOSIS — Z91048 Other nonmedicinal substance allergy status: Secondary | ICD-10-CM

## 2024-04-16 DIAGNOSIS — D509 Iron deficiency anemia, unspecified: Secondary | ICD-10-CM | POA: Diagnosis present

## 2024-04-16 DIAGNOSIS — Z8249 Family history of ischemic heart disease and other diseases of the circulatory system: Secondary | ICD-10-CM

## 2024-04-16 DIAGNOSIS — Z886 Allergy status to analgesic agent status: Secondary | ICD-10-CM

## 2024-04-16 DIAGNOSIS — I252 Old myocardial infarction: Secondary | ICD-10-CM

## 2024-04-16 DIAGNOSIS — Z8701 Personal history of pneumonia (recurrent): Secondary | ICD-10-CM

## 2024-04-16 DIAGNOSIS — K860 Alcohol-induced chronic pancreatitis: Secondary | ICD-10-CM | POA: Diagnosis present

## 2024-04-16 DIAGNOSIS — F419 Anxiety disorder, unspecified: Secondary | ICD-10-CM | POA: Diagnosis present

## 2024-04-16 DIAGNOSIS — Z8711 Personal history of peptic ulcer disease: Secondary | ICD-10-CM

## 2024-04-16 DIAGNOSIS — H9319 Tinnitus, unspecified ear: Secondary | ICD-10-CM | POA: Diagnosis present

## 2024-04-16 DIAGNOSIS — K219 Gastro-esophageal reflux disease without esophagitis: Secondary | ICD-10-CM | POA: Diagnosis present

## 2024-04-16 DIAGNOSIS — I110 Hypertensive heart disease with heart failure: Secondary | ICD-10-CM | POA: Diagnosis present

## 2024-04-16 DIAGNOSIS — Z888 Allergy status to other drugs, medicaments and biological substances status: Secondary | ICD-10-CM

## 2024-04-16 DIAGNOSIS — F17210 Nicotine dependence, cigarettes, uncomplicated: Secondary | ICD-10-CM | POA: Diagnosis present

## 2024-04-16 DIAGNOSIS — Z811 Family history of alcohol abuse and dependence: Secondary | ICD-10-CM

## 2024-04-16 DIAGNOSIS — F1410 Cocaine abuse, uncomplicated: Secondary | ICD-10-CM | POA: Diagnosis present

## 2024-04-16 DIAGNOSIS — Z91041 Radiographic dye allergy status: Secondary | ICD-10-CM

## 2024-04-16 DIAGNOSIS — F17290 Nicotine dependence, other tobacco product, uncomplicated: Secondary | ICD-10-CM | POA: Diagnosis present

## 2024-04-16 DIAGNOSIS — G8929 Other chronic pain: Secondary | ICD-10-CM | POA: Diagnosis present

## 2024-04-16 DIAGNOSIS — I480 Paroxysmal atrial fibrillation: Principal | ICD-10-CM | POA: Diagnosis present

## 2024-04-16 DIAGNOSIS — Z91199 Patient's noncompliance with other medical treatment and regimen due to unspecified reason: Secondary | ICD-10-CM

## 2024-04-16 LAB — URINALYSIS, ROUTINE W REFLEX MICROSCOPIC
Bilirubin Urine: NEGATIVE
Glucose, UA: NEGATIVE mg/dL
Hgb urine dipstick: NEGATIVE
Ketones, ur: NEGATIVE mg/dL
Leukocytes,Ua: NEGATIVE
Nitrite: NEGATIVE
Protein, ur: NEGATIVE mg/dL
Specific Gravity, Urine: 1.004 — ABNORMAL LOW (ref 1.005–1.030)
pH: 6 (ref 5.0–8.0)

## 2024-04-16 LAB — CBC
HCT: 35.6 % — ABNORMAL LOW (ref 39.0–52.0)
Hemoglobin: 11.5 g/dL — ABNORMAL LOW (ref 13.0–17.0)
MCH: 30.7 pg (ref 26.0–34.0)
MCHC: 32.3 g/dL (ref 30.0–36.0)
MCV: 94.9 fL (ref 80.0–100.0)
Platelets: 196 K/uL (ref 150–400)
RBC: 3.75 MIL/uL — ABNORMAL LOW (ref 4.22–5.81)
RDW: 18.8 % — ABNORMAL HIGH (ref 11.5–15.5)
WBC: 9.3 K/uL (ref 4.0–10.5)
nRBC: 0 % (ref 0.0–0.2)

## 2024-04-16 LAB — BASIC METABOLIC PANEL WITH GFR
Anion gap: 11 (ref 5–15)
BUN: 6 mg/dL (ref 6–20)
CO2: 17 mmol/L — ABNORMAL LOW (ref 22–32)
Calcium: 7.3 mg/dL — ABNORMAL LOW (ref 8.9–10.3)
Chloride: 110 mmol/L (ref 98–111)
Creatinine, Ser: 0.96 mg/dL (ref 0.61–1.24)
GFR, Estimated: >60 mL/min (ref >60)
Glucose, Bld: 188 mg/dL — ABNORMAL HIGH (ref 70–99)
Potassium: 4 mmol/L (ref 3.5–5.1)
Sodium: 138 mmol/L (ref 135–145)

## 2024-04-16 LAB — RAPID URINE DRUG SCREEN, HOSP PERFORMED
Amphetamines: NOT DETECTED
Barbiturates: NOT DETECTED
Benzodiazepines: NOT DETECTED
Cocaine: POSITIVE — AB
Opiates: NOT DETECTED
Tetrahydrocannabinol: POSITIVE — AB

## 2024-04-16 LAB — HEPATIC FUNCTION PANEL
ALT: 23 U/L (ref 0–44)
AST: 37 U/L (ref 15–41)
Albumin: 3 g/dL — ABNORMAL LOW (ref 3.5–5.0)
Alkaline Phosphatase: 69 U/L (ref 38–126)
Bilirubin, Direct: 0.2 mg/dL (ref 0.0–0.2)
Indirect Bilirubin: 0.1 mg/dL — ABNORMAL LOW (ref 0.3–0.9)
Total Bilirubin: 0.3 mg/dL (ref 0.0–1.2)
Total Protein: 6.3 g/dL — ABNORMAL LOW (ref 6.5–8.1)

## 2024-04-16 LAB — ETHANOL: Alcohol, Ethyl (B): <15 mg/dL (ref <15)

## 2024-04-16 LAB — TROPONIN I (HIGH SENSITIVITY)
Troponin I (High Sensitivity): 6 ng/L (ref <18)
Troponin I (High Sensitivity): 7 ng/L (ref <18)

## 2024-04-16 LAB — TSH: TSH: 0.815 u[IU]/mL (ref 0.350–4.500)

## 2024-04-16 LAB — LIPASE, BLOOD: Lipase: 21 U/L (ref 11–51)

## 2024-04-16 LAB — BRAIN NATRIURETIC PEPTIDE: B Natriuretic Peptide: 90.7 pg/mL (ref 0.0–100.0)

## 2024-04-16 LAB — MAGNESIUM: Magnesium: 1.1 mg/dL — ABNORMAL LOW (ref 1.7–2.4)

## 2024-04-16 MED ORDER — HYDROMORPHONE HCL 1 MG/ML IJ SOLN
0.5000 mg | Freq: Once | INTRAMUSCULAR | Status: AC
  Administered 2024-04-16: 0.5 mg via INTRAVENOUS
  Filled 2024-04-16: qty 1

## 2024-04-16 MED ORDER — HEPARIN (PORCINE) 25000 UT/250ML-% IV SOLN
850.0000 [IU]/h | INTRAVENOUS | Status: DC
  Administered 2024-04-16: 850 [IU]/h via INTRAVENOUS
  Filled 2024-04-16: qty 250

## 2024-04-16 MED ORDER — ALUM & MAG HYDROXIDE-SIMETH 200-200-20 MG/5ML PO SUSP
30.0000 mL | Freq: Once | ORAL | Status: AC
  Administered 2024-04-16: 30 mL via ORAL
  Filled 2024-04-16: qty 30

## 2024-04-16 MED ORDER — ONDANSETRON HCL 4 MG/2ML IJ SOLN
4.0000 mg | Freq: Once | INTRAMUSCULAR | Status: AC
  Administered 2024-04-16: 4 mg via INTRAVENOUS
  Filled 2024-04-16: qty 2

## 2024-04-16 MED ORDER — FENTANYL CITRATE (PF) 50 MCG/ML IJ SOSY
100.0000 ug | PREFILLED_SYRINGE | Freq: Once | INTRAMUSCULAR | Status: AC
  Administered 2024-04-16: 100 ug via INTRAVENOUS
  Filled 2024-04-16: qty 2

## 2024-04-16 MED ORDER — LORAZEPAM 2 MG/ML IJ SOLN
1.0000 mg | Freq: Once | INTRAMUSCULAR | Status: AC
  Administered 2024-04-16: 1 mg via INTRAVENOUS
  Filled 2024-04-16: qty 1

## 2024-04-16 MED ORDER — DILTIAZEM HCL-DEXTROSE 125-5 MG/125ML-% IV SOLN (PREMIX)
5.0000 mg/h | INTRAVENOUS | Status: DC
  Administered 2024-04-16: 5 mg/h via INTRAVENOUS
  Administered 2024-04-16: 15 mg/h via INTRAVENOUS
  Filled 2024-04-16 (×3): qty 125

## 2024-04-16 MED ORDER — CALCIUM GLUCONATE-NACL 1-0.675 GM/50ML-% IV SOLN
1.0000 g | Freq: Once | INTRAVENOUS | Status: AC
  Administered 2024-04-16: 1000 mg via INTRAVENOUS
  Filled 2024-04-16: qty 50

## 2024-04-16 MED ORDER — LIDOCAINE VISCOUS HCL 2 % MT SOLN
15.0000 mL | Freq: Once | OROMUCOSAL | Status: AC
  Administered 2024-04-16: 15 mL via ORAL
  Filled 2024-04-16: qty 15

## 2024-04-16 MED ORDER — MAGNESIUM SULFATE 2 GM/50ML IV SOLN
2.0000 g | Freq: Once | INTRAVENOUS | Status: AC
  Administered 2024-04-16: 2 g via INTRAVENOUS
  Filled 2024-04-16: qty 50

## 2024-04-16 NOTE — ED Provider Notes (Signed)
 [place] EMERGENCY DEPARTMENT AT [facility] Provider Note   CSN: [id number] Arrival date & time: 04/16/24  1630     Patient presents with: Irregular Heart Beat and Shortness of Breath   [name] is a 53 y.o. male.   HPI Patient presents for chest pain and shortness of breath.  Medical history includes polysubstance abuse, WPW, HTN, depression, GERD, seizure, atrial fibrillation, asthma, anemia.  He is prescribed Coreg , diltiazem .  He states that he ran out of his diltiazem  and has been taking his old metoprolol .  He last used cocaine 5 days ago.  He last drank alcohol  yesterday.  Last night he noticed some shortness of breath.  At midnight, he felt palpitations and the sensation of his heart racing.  His palpitations continued today.  He had onset of left-sided chest pain in addition to left-sided abdominal pain.  He had ongoing shortness of breath that is worsened with any exertion.  EMS was called. EMS noted heart rate initially in the range of 200.  He was given 20 mg of diltiazem  with improved heart rate to the 170s.    Prior to Admission medications   Medication Sig Start Date End Date Taking? Authorizing Provider  acetaminophen  (TYLENOL ) 500 MG tablet Take 1,000 mg by mouth daily as needed for mild pain (pain score 1-3) or moderate pain (pain score 4-6).    [provider]  calcium  carbonate (TUMS - DOSED IN MG ELEMENTAL CALCIUM ) 500 MG chewable tablet Chew 2 tablets (400 mg of elemental calcium  total) by mouth 3 (three) times daily as needed for indigestion or heartburn. 03/01/24   Theophilus Pagan, MD  carvedilol  (COREG ) 25 MG tablet Take 1 tablet (25 mg total) by mouth 2 (two) times daily with a meal. 12/08/23   West, Katlyn D, NP  cyclobenzaprine  (FLEXERIL ) 10 MG tablet Take 1 tablet (10 mg total) by mouth 3 (three) times daily as needed for muscle spasms. 04/06/24   Kammerer, Megan L, DO  diltiazem  (CARDIZEM  CD) 240 MG 24 hr capsule Take 1 capsule  (240 mg total) by mouth 2 (two) times daily. 03/01/24   Theophilus Pagan, MD  famotidine  (PEPCID ) 40 MG tablet Take 1 tablet (40 mg total) by mouth daily. Patient taking differently: Take 40 mg by mouth 2 (two) times daily. 10/17/23   Henderly, Britni A, PA-C  feeding supplement (ENSURE PLUS HIGH PROTEIN) LIQD Take 237 mLs by mouth 3 (three) times daily between meals. 03/01/24   Theophilus Pagan, MD  ferrous sulfate  325 (65 FE) MG tablet Take 1 tablet (325 mg total) by mouth daily with breakfast. 03/01/24   Theophilus Pagan, MD  folic acid  (FOLVITE ) 1 MG tablet Take 1 tablet (1 mg total) by mouth daily. 03/01/24   Theophilus Pagan, MD  lipase/protease/amylase (CREON ) 36000 UNITS CPEP capsule Take 3 capsules (108,000 Units total) by mouth 3 (three) times daily with meals. May also take 1 capsule (36,000 Units total) as needed (with snacks). 03/01/24   Theophilus Pagan, MD  loperamide  (IMODIUM  A-D) 2 MG tablet Take 2 mg by mouth 4 (four) times daily as needed for diarrhea or loose stools.    [provider]  magnesium  oxide (MAG-OX) 400 (240 Mg) MG tablet Take 1 tablet (400 mg total) by mouth daily. 11/07/23   Hoy Nidia FALCON, PA-C  Multiple Vitamin (MULTIVITAMIN WITH MINERALS) TABS tablet Take 1 tablet by mouth daily. Patient taking differently: Take 2 tablets by mouth daily. 08/21/23   Ghimire, Donalda HERO, MD  naloxone  (  NARCAN ) nasal spray 4 mg/0.1 mL Place 1 spray into the nose daily as needed (Opioid overdose). 03/01/24   Theophilus Pagan, MD  nicotine  (NICODERM CQ  - DOSED IN MG/24 HOURS) 21 mg/24hr patch Place 1 patch (21 mg total) onto the skin daily. Patient taking differently: Place 21 mg onto the skin daily as needed (for smoking cessation). 11/14/23   Amin, Ankit C, MD  nicotine  polacrilex (NICORETTE ) 2 MG gum Take 1 each (2 mg total) by mouth as needed for smoking cessation. 03/01/24   Theophilus Pagan, MD  ondansetron  (ZOFRAN -ODT) 4 MG disintegrating tablet Take 1 tablet (4 mg total)  by mouth every 8 (eight) hours as needed. 03/26/24   Long, Fonda MATSU, MD  pantoprazole  (PROTONIX ) 40 MG tablet Take 1 tablet (40 mg total) by mouth daily. 01/18/24   Kehrli, Kelsey F, PA-C  polyethylene glycol powder (GLYCOLAX /MIRALAX ) 17 GM/SCOOP powder Take 17 g by mouth daily as needed for mild constipation. 11/13/23   Amin, Ankit C, MD  promethazine  (PHENERGAN ) 25 MG tablet Take 1 tablet (25 mg total) by mouth every 8 (eight) hours as needed for up to 4 days for nausea or vomiting. 04/06/24 04/10/24  Kammerer, Megan L, DO  senna-docusate (SENOKOT-S) 8.6-50 MG tablet Take 1 tablet by mouth at bedtime as needed for mild constipation. 03/26/24   Long, Fonda MATSU, MD  sucralfate  (CARAFATE ) 1 GM/10ML suspension Take 1 g by mouth 4 (four) times daily -  with meals and at bedtime.    [provider]  tamsulosin  (FLOMAX ) 0.4 MG CAPS capsule Take 1 capsule (0.4 mg total) by mouth daily. 03/01/24   Theophilus Pagan, MD  thiamine  (VITAMIN B-1) 100 MG tablet Take 1 tablet (100 mg total) by mouth daily. 03/01/24   Theophilus Pagan, MD  amitriptyline  (ELAVIL ) 25 MG tablet Take 1 tablet (25 mg total) by mouth at bedtime. Patient not taking: Reported on 08/08/2019 10/15/18 08/08/19  Tobie Yetta HERO, MD    Allergies: Robaxin  [methocarbamol ], Bayer aspirin  [aspirin ], Trazodone  and nefazodone, Adhesive [tape], Latex, Toradol  [ketorolac  tromethamine ], Contrast media [iodinated contrast media], Fish allergy, Reglan  [metoclopramide ], Garnell gums oil], and Shellfish allergy    Review of Systems  Respiratory:  Positive for shortness of breath.   Cardiovascular:  Positive for chest pain and palpitations.  Gastrointestinal:  Positive for abdominal pain.  All other systems reviewed and are negative.   Updated Vital Signs BP (!) 112/90   Pulse (!) 128   Temp (!) 97.2 F (36.2 C) (Oral)   Resp 13   Ht 5' 8 (1.727 m)   Wt 53.5 kg   SpO2 100%   BMI 17.94 kg/m   Physical Exam Vitals and nursing note reviewed.   Constitutional:      General: He is not in acute distress.    Appearance: Normal appearance. He is well-developed. He is not ill-appearing, toxic-appearing or diaphoretic.  HENT:     Head: Normocephalic and atraumatic.     Right Ear: External ear normal.     Left Ear: External ear normal.     Nose: Nose normal.     Mouth/Throat:     Mouth: Mucous membranes are moist.  Eyes:     Extraocular Movements: Extraocular movements intact.     Conjunctiva/sclera: Conjunctivae normal.  Cardiovascular:     Rate and Rhythm: Tachycardia present. Rhythm irregular.     Heart sounds: No murmur heard. Pulmonary:     Effort: Pulmonary effort is normal. No respiratory distress.     Breath sounds: Normal  breath sounds. No wheezing or rales.  Chest:     Chest wall: Tenderness present.  Abdominal:     General: There is no distension.     Palpations: Abdomen is soft.     Tenderness: There is abdominal tenderness. There is no guarding or rebound.  Musculoskeletal:        General: No swelling. Normal range of motion.     Cervical back: Normal range of motion and neck supple.     Right lower leg: No edema.     Left lower leg: No edema.  Skin:    General: Skin is warm and dry.     Coloration: Skin is not jaundiced or pale.  Neurological:     General: No focal deficit present.     Mental Status: He is alert and oriented to person, place, and time.  Psychiatric:        Mood and Affect: Mood normal.        Behavior: Behavior normal.     (all labs ordered are listed, but only abnormal results are displayed) Labs Reviewed  BASIC METABOLIC PANEL WITH GFR - Abnormal; Notable for the following components:      Result Value   CO2 17 (*)    Glucose, Bld 188 (*)    Calcium  7.3 (*)    All other components within normal limits  MAGNESIUM  - Abnormal; Notable for the following components:   Magnesium  1.1 (*)    All other components within normal limits  CBC - Abnormal; Notable for the following  components:   RBC 3.75 (*)    Hemoglobin 11.5 (*)    HCT 35.6 (*)    RDW 18.8 (*)    All other components within normal limits  RAPID URINE DRUG SCREEN, HOSP PERFORMED - Abnormal; Notable for the following components:   Cocaine POSITIVE (*)    Tetrahydrocannabinol POSITIVE (*)    All other components within normal limits  URINALYSIS, ROUTINE W REFLEX MICROSCOPIC - Abnormal; Notable for the following components:   Color, Urine STRAW (*)    Specific Gravity, Urine 1.004 (*)    All other components within normal limits  HEPATIC FUNCTION PANEL - Abnormal; Notable for the following components:   Total Protein 6.3 (*)    Albumin  3.0 (*)    Indirect Bilirubin 0.1 (*)    All other components within normal limits  TSH  BRAIN NATRIURETIC PEPTIDE  ETHANOL  LIPASE, BLOOD  TROPONIN I (HIGH SENSITIVITY)  TROPONIN I (HIGH SENSITIVITY)    EKG: EKG Interpretation Date/Time:  Tuesday April 16 2024 16:52:40 EST Ventricular Rate:  172 PR Interval:    QRS Duration:  69 QT Interval:  263 QTC Calculation: 457 R Axis:   49  Text Interpretation: Atrial fibrillation Multiform ventricular premature complexes Repolarization abnormality, prob rate related Confirmed by Melvenia Motto [phone]) on 04/16/2024 6:41:56 PM  Radiology: CT CHEST ABDOMEN PELVIS WO CONTRAST Result Date: 04/16/2024 CLINICAL DATA:  Unintended weight loss, history of chest pain EXAM: CT CHEST, ABDOMEN AND PELVIS WITHOUT CONTRAST TECHNIQUE: Multidetector CT imaging of the chest, abdomen and pelvis was performed following the standard protocol without IV contrast. RADIATION DOSE REDUCTION: This exam was performed according to the departmental dose-optimization program which includes automated exposure control, adjustment of the mA and/or kV according to patient size and/or use of iterative reconstruction technique. COMPARISON:  04/16/2024, 03/26/2024 FINDINGS: CT CHEST FINDINGS Cardiovascular: Unenhanced imaging the heart is unremarkable  without pericardial effusion. Normal caliber of the thoracic aorta. Mediastinum/Nodes: No  enlarged mediastinal, hilar, or axillary lymph nodes. Thyroid  gland, trachea, and esophagus demonstrate no significant findings. Lungs/Pleura: Biapical emphysematous changes. No acute airspace disease, effusion, or pneumothorax. Central airways are patent. Musculoskeletal: No acute or destructive bony abnormalities. Reconstructed images demonstrate no additional findings. CT ABDOMEN PELVIS FINDINGS Hepatobiliary: Unremarkable unenhanced appearance of the liver and gallbladder. Pancreas: Stable 4.4 x 4.2 cm pseudocyst within the pancreatic body. No acute inflammatory changes. Calcifications within the pancreatic parenchyma consistent with sequela of chronic calcific pancreatitis. Spleen: Unremarkable unenhanced appearance. Adrenals/Urinary Tract: No urinary tract calculi or obstructive uropathy. Unremarkable appearance of the adrenals and bladder. Stomach/Bowel: No bowel obstruction or ileus. Normal appendix right mid abdomen. There is continued diffuse gastric wall thickening, unchanged since numerous previous exams. Please correlate with previous upper endoscopy results. Vascular/Lymphatic: Aortic atherosclerosis. No enlarged abdominal or pelvic lymph nodes. Reproductive: Prostate is unremarkable. Other: No free fluid or free intraperitoneal gas. No abdominal wall hernia. Musculoskeletal: No acute or destructive bony abnormalities. Reconstructed images demonstrate no additional findings. IMPRESSION: 1. Chronic diffuse gastric wall thickening, unchanged since numerous previous exams. Please correlate with previous upper endoscopy results. 2. Sequela of chronic calcific pancreatitis, with stable 4.4 cm pseudocyst. No acute inflammatory changes. 3. No acute intra-abdominal scarred set no acute intrathoracic, intra-abdominal, or intrapelvic process. 4.  Aortic Atherosclerosis (ICD10-I70.0). 5. Pulmonary emphysema is present.  Pulmonary emphysema is an independent risk factor for lung cancer. Recommend patient be considered for lung cancer screening. [place]  Chief Financial Officer. Screening for Lung Cancer: [place]  Licensed Conveyancer. JAMA. 2021;325(10):962-970. Electronically Signed   By: Ozell Daring M.D.   On: 04/16/2024 20:49   DG Chest Port 1 View Result Date: 04/16/2024 CLINICAL DATA:  Chest pain. EXAM: PORTABLE CHEST 1 VIEW COMPARISON:  04/02/2024. FINDINGS: The heart size and mediastinal contours are within normal limits. No focal consolidation, pleural effusion, or pneumothorax. No acute osseous abnormality. IMPRESSION: No acute cardiopulmonary findings. Electronically Signed   By: Harrietta Sherry M.D.   On: 04/16/2024 17:40     Procedures   Medications Ordered in the ED  diltiazem  (CARDIZEM ) 125 mg in dextrose  5% 125 mL (1 mg/mL) infusion (10 mg/hr Intravenous Infusion Verify 04/16/24 1812)  fentaNYL  (SUBLIMAZE ) injection 100 mcg (100 mcg Intravenous Given 04/16/24 1652)  LORazepam  (ATIVAN ) injection 1 mg (1 mg Intravenous Given 04/16/24 1652)  alum & mag hydroxide-simeth (MAALOX/MYLANTA) 200-200-20 MG/5ML suspension 30 mL (30 mLs Oral Given 04/16/24 1737)    And  lidocaine  (XYLOCAINE ) 2 % viscous mouth solution 15 mL (15 mLs Oral Given 04/16/24 1737)  HYDROmorphone  (DILAUDID ) injection 0.5 mg (0.5 mg Intravenous Given 04/16/24 1736)  magnesium  sulfate IVPB 2 g 50 mL (0 g Intravenous Stopped 04/16/24 1922)  HYDROmorphone  (DILAUDID ) injection 0.5 mg (0.5 mg Intravenous Given 04/16/24 1901)  ondansetron  (ZOFRAN ) injection 4 mg (4 mg Intravenous Given 04/16/24 2053)                                    Medical Decision Making Amount and/or Complexity of Data Reviewed Labs: ordered. Radiology: ordered.  Risk OTC drugs. Prescription drug management. Decision regarding hospitalization.   This patient presents to the ED for concern of palpitations, chest pain, abdominal  pain, shortness of breath, this involves an extensive number of treatment options, and is a complaint that carries with it a high risk of complications and morbidity.  The differential diagnosis includes arrhythmia, ACS, sympathomimetic drug use, pancreatitis,  colitis, enteritis   Co morbidities / Chronic conditions that complicate the patient evaluation  polysubstance abuse, WPW, HTN, depression, GERD, seizure, atrial fibrillation, asthma, anemia   Additional history obtained:  Additional history obtained from EMR External records from outside source obtained and reviewed including N/A   Lab Tests:  I Ordered, and personally interpreted labs.  The pertinent results include: Baseline hemoglobin, no leukocytosis, normal kidney function, hypomagnesemia with otherwise normal electrolytes.  UDS positive for cocaine and THC.   Imaging Studies ordered:  I ordered imaging studies including chest x-ray, CT of chest, abdomen, pelvis I independently visualized and interpreted imaging which showed no acute findings I agree with the radiologist interpretation   Cardiac Monitoring: / EKG:  The patient was maintained on a cardiac monitor.  I personally viewed and interpreted the cardiac monitored which showed an underlying rhythm of: Atrial fibrillation with RVR   Problem List / ED Course / Critical interventions / Medication management  Patient presenting for chest pain shortness of breath.  Found to be in atrial fibrillation with RVR with EMS.  Given 20 mg of diltiazem  prior to arrival.  On arrival, patient's heart rate fluctuating in the range of 150s to 160s.  His breathing is unlabored.  He is able to speak in complete sentences.  He was placed on cardiac monitor.  Patient's rhythm is irregularly irregular consistent with atrial fibrillation.  He does have a history of WPW but states that his symptoms are more consistent with A-fib with RVR.  When he has WPW tachycardia, he typically has a  syncopal episode.  He has not had any near syncopal symptoms since onset of his palpitations.  Patient denies any cocaine use in the past 5 days.  He did drink alcohol  yesterday but states that he only had 24 ounces of beer.  Fentanyl  was ordered for analgesia.  Ativan  was ordered for nausea.  Will start on diltiazem  gtt.  Workup was initiated.  Lab work was notable for hypomagnesemia.  Replacement magnesium  was ordered.  Patient required multiple doses of pain medication while in the ED.  On imaging studies, no acute findings were identified.  Although he does have findings consistent with chronic pancreatitis, lipase today is normal.  He did have improvement in his heart rate but remained in atrial fibrillation with RVR on 15 mg/h of diltiazem .  Heparin  was ordered.  Will admit for further management. I ordered medication including diltiazem  for rate control, heparin  for atrial fibrillation with RVR, Reevaluation of the patient after these medicines showed that the patient Ativan  and Zofran  for nausea, Dilaudid  and fentanyl  for analgesia I have reviewed the patients home medicines and have made adjustments as needed  Social Determinants of Health:  Ongoing polysubstance abuse   CRITICAL CARE Performed by: Bernardino Fireman   Total critical care time: 32 minutes  Critical care time was exclusive of separately billable procedures and treating other patients.  Critical care was necessary to treat or prevent imminent or life-threatening deterioration.  Critical care was time spent personally by me on the following activities: development of treatment plan with patient and/or surrogate as well as nursing, discussions with consultants, evaluation of patient's response to treatment, examination of patient, obtaining history from patient or surrogate, ordering and performing treatments and interventions, ordering and review of laboratory studies, ordering and review of radiographic studies, pulse oximetry and  re-evaluation of patient's condition.      Final diagnoses:  Atrial fibrillation with RVR [facility])    ED Discharge Orders  None          Melvenia Motto, MD 04/16/24 2126

## 2024-04-16 NOTE — Progress Notes (Signed)
 ANTICOAGULATION CONSULT NOTE  Pharmacy Consult for Heparin  Indication: atrial fibrillation  Allergies  Allergen Reactions   Robaxin  [Methocarbamol ] Other (See Comments)    Myoclonus    Bayer Aspirin  [Aspirin ] Other (See Comments)    Unknown reaction   Trazodone  And Nefazodone Other (See Comments)    Muscle spasms   Adhesive [Tape] Itching   Latex Itching   Toradol  [Ketorolac  Tromethamine ] Other (See Comments)    Hx ulcers   Contrast Media [Iodinated Contrast Media] Hives   Fish Allergy Nausea And Vomiting and Rash   Reglan  [Metoclopramide ] Other (See Comments)    Muscle spasms   Salmon [Fish Oil] Nausea And Vomiting and Rash   Shellfish Allergy Nausea And Vomiting and Rash    Patient Measurements: Height: 5' 8 (172.7 cm) Weight: 53.5 kg (118 lb) IBW/kg (Calculated) : 68.4 Heparin  Dosing Weight: 53.5 kg  Vital Signs: Temp: 97.2 F (36.2 C) (12/09 2054) Temp Source: Oral (12/09 2054) BP: 112/90 (12/09 2045) Pulse Rate: 128 (12/09 2045)  Labs: Recent Labs    04/16/24 1643 04/16/24 1844  HGB 11.5*  --   HCT 35.6*  --   PLT 196  --   CREATININE 0.96  --   TROPONINIHS 6 7    Estimated Creatinine Clearance: 66.6 mL/min (by C-G formula based on SCr of 0.96 mg/dL).   Medical History: Past Medical History:  Diagnosis Date   Alcoholism /alcohol  abuse    Anemia    Anxiety    Arthritis    knees; arms; elbows (03/26/2015)   Asthma    Bipolar disorder (HCC)    Chronic bronchitis (HCC)    Chronic lower back pain    Chronic pancreatitis (HCC)    Cocaine abuse (HCC)    Depression    Family history of adverse reaction to anesthesia    Femoral condyle fracture (HCC) 03/08/2014   left medial/notes 03/09/2014   GERD (gastroesophageal reflux disease)    H/O hiatal hernia    H/O suicide attempt 10/2012   High cholesterol    History of blood transfusion 10/2012   when I tried to commit suicide   History of stomach ulcers    Hypertension    Marijuana abuse,  continuous    Migraine    a few times/year (03/26/2015)   Pancreatitis    Pneumonia 1990's X 3   PTSD (post-traumatic stress disorder)    Seizures (HCC)    Sickle cell trait    WPW (Wolff-Parkinson-White syndrome)    thelbert 03/06/2013    Medications:  (Not in a hospital admission)  Scheduled:  Infusions:   diltiazem  (CARDIZEM ) infusion 10 mg/hr (04/16/24 1812)   PRN:   Assessment: 53 yom with a history of GIB, substance abuse, WPW, HTN, depression, GERD, seizure, AF, asthma, anemia. Heparin  per pharmacy consult placed for atrial fibrillation.  Patient is not on anticoagulation prior to arrival.  Hgb 11.5; plt 196  Goal of Therapy:  Heparin  level 0.3-0.7 units/ml Monitor platelets by anticoagulation protocol: Yes   Plan:  Start heparin  infusion at 850 units/hr Check anti-Xa level in 8 hours and daily while on heparin  Continue to monitor H&H and platelets  Dorn Buttner, PharmD, BCPS 04/16/2024 9:28 PM ED Clinical Pharmacist -  [phone]

## 2024-04-16 NOTE — H&P (Addendum)
 Jason Hausman FMW:[id number] DOB: [birth year] DOA: 04/16/2024     PCP: Myra Wanda LABOR, MD   Outpatient Specialists:   [facility]  Patient arrived to ER on 04/16/24 at 1630 Referred by Attending Melvenia Motto, MD   Patient coming from:    home Lives   With family     Chief Complaint:   Chief Complaint  Patient presents with   Irregular Heart Beat   Shortness of Breath    HPI: [name] is a 53 y.o. male with medical history significant of chronic pancreatitis,, Wolf Parkinson White syndrome A-fib, peptic ulcer disease, asthma, seizure-like activity, alcohol  abuse, polysubstance abuse, anemia, hypertension, bipolar, tobacco abuse, chronic diastolic chf    Presented with  shortness of breath Patient was brought in by EMS with chest pain and shortness of breath initial heart rate was in 200s he was given 20 of diltiazem  which brought heart rate down to 170  Initial EKG worrisome for atrial fibrillation with RVR blood pressure 115/98 patient was not hypoxic.  Placed on 3 L of O2 for comfort he has known history of cocaine abuse and WPW as well as known history of atrial fibrillation He is supposed to be taking Coreg  and diltiazem  but ran out of his diltiazem  and has been taking his old metoprolol  instead Last time cocaine use was 5 days ago He continues to drink and last alcoholic drink was yesterday Reports the palpitations started last night and continued Today he also started having chest pain abdominal pain and worsening shortness of breath that became more severe with any exertion and not when he called EMS EKG confirmed A-fib he was started on diltiazem  drip titrated to 15 and heparin  was ordered    Patient did state that about 2 days ago he was having significant shortness of breath and squeezing chest pain  He did had some beer  yesterday and then started to also have some epigastric pain radiating to his chest that is consistent with his chronic  pancreatitis Pain has not been improved with his home oxycodone  He has tried to establish care with pain clinic in the past but was not able to do so secondary to substance abuse He drinks between 7-8 beers a day And started to have a bit of shakes now Reports last cocaine use was about 4 to 5 days ago Patient has chronic history of anemia Not on blood thinners because his CHA2DS2-VASc score is only 1   Reports pain nonpleuritic Epigastric pain worse with palpation and eating consistent with chronic pancreatitis significant ETOH intake   Does smoke   marijuana use cocaine abuse    Regarding pertinent Chronic problems:       HTN on Coreg , diltiazem   chronic CHF diastolic - last echo  Recent Results (from the past 56199 hours)  ECHOCARDIOGRAM COMPLETE   Collection Time: 11/10/23  4:45 PM  Result Value   Weight 1,963.2   Height 68   BP 203/112   S' Lateral 2.60   Area-P 1/2 5.02   Est EF 70 - 75%   Narrative      ECHOCARDIOGRAM REPORT      IMPRESSIONS    1. Left ventricular ejection fraction, by estimation, is 70 to 75%. The left ventricle has hyperdynamic function. The left ventricle has no regional wall motion abnormalities. Left ventricular diastolic parameters are consistent with Grade I diastolic  dysfunction (impaired relaxation).  2. Right ventricular systolic function is normal. The right ventricular size is normal.  There is mildly elevated pulmonary artery systolic pressure. The estimated right ventricular systolic pressure is 44.0 mmHg.  3. The mitral valve is normal in structure. No evidence of mitral valve regurgitation. No evidence of mitral stenosis.  4. The aortic valve has an indeterminant number of cusps. Aortic valve regurgitation is not visualized. No aortic stenosis is present.  5. The inferior vena cava is normal in size with <50% respiratory variability, suggesting right atrial pressure of 8 mmHg.  Comparison(s): No significant change from prior study.              Asthma -well   controlled on home inhalers/ nebs                      A. Fib -   atrial fibrillation CHA2DS2 vas score   1  Not on anticoagulation secondary         -  Rate control:  Coreg           Chronic anemia - baseline hg Hemoglobin & Hematocrit  Recent Labs    04/02/24 1825 04/06/24 1156 04/16/24 1643  HGB 11.3* 10.2* 11.5*   Iron/TIBC/Ferritin/ %Sat    Component Value Date/Time   IRON 28 (L) 02/26/2024 1215   TIBC 273 02/26/2024 1215   FERRITIN 35 02/26/2024 1215   IRONPCTSAT 10 (L) 02/26/2024 1215    While in ER:     Noted to be significantly tachycardic initially in 200s Started on diltiazem  drip heart rate down to 135 Cardiology consult  Lab Orders         Basic metabolic panel         Magnesium          CBC         TSH         Brain natriuretic peptide (IF shortness of breath has been documented this visit)         Ethanol         Rapid urine drug screen (hospital performed)         Urinalysis, Routine w reflex microscopic -Urine, Clean Catch         Hepatic function panel         Lipase, blood         Heparin  level (unfractionated)         Heparin  level (unfractionated)         CBC      CXR -  NON acute  CTabd/pelvis without contrast-chronic gastric wall thickening chronic pancreatitis  Following Medications were ordered in ER: Medications  diltiazem  (CARDIZEM ) 125 mg in dextrose  5% 125 mL (1 mg/mL) infusion (10 mg/hr Intravenous Infusion Verify 04/16/24 1812)  heparin  ADULT infusion 100 units/mL (25000 units/250mL) (850 Units/hr Intravenous New Bag/Given 04/16/24 2158)  fentaNYL  (SUBLIMAZE ) injection 100 mcg (100 mcg Intravenous Given 04/16/24 1652)  LORazepam  (ATIVAN ) injection 1 mg (1 mg Intravenous Given 04/16/24 1652)  alum & mag hydroxide-simeth (MAALOX/MYLANTA) 200-200-20 MG/5ML suspension 30 mL (30 mLs Oral Given 04/16/24 1737)    And  lidocaine  (XYLOCAINE ) 2 % viscous mouth solution 15 mL (15 mLs Oral Given 04/16/24 1737)   HYDROmorphone  (DILAUDID ) injection 0.5 mg (0.5 mg Intravenous Given 04/16/24 1736)  magnesium  sulfate IVPB 2 g 50 mL (0 g Intravenous Stopped 04/16/24 1922)  HYDROmorphone  (DILAUDID ) injection 0.5 mg (0.5 mg Intravenous Given 04/16/24 1901)  ondansetron  (ZOFRAN ) injection 4 mg (4 mg Intravenous Given 04/16/24 2053)  HYDROmorphone  (DILAUDID ) injection 0.5 mg (0.5 mg Intravenous Given 04/16/24 2245)  _______________________________________________________ ER Provider Called:    Cardiology    Dr.Ingram They Recommend admit to medicine   SEEN in ER Restart home diltiazem  dose while continue IV diltiazem  and IV heparin  ED Triage Vitals  Encounter Vitals Group     BP 04/16/24 1636 (!) 136/124     Girls Systolic BP Percentile --      Girls Diastolic BP Percentile --      Boys Systolic BP Percentile --      Boys Diastolic BP Percentile --      Pulse Rate 04/16/24 1636 81     Resp 04/16/24 1636 (!) 24     Temp 04/16/24 1636 98 F (36.7 C)     Temp Source 04/16/24 1636 Oral     SpO2 04/16/24 1636 100 %     Weight 04/16/24 1634 118 lb (53.5 kg)     Height 04/16/24 1634 5' 8 (1.727 m)     Head Circumference --      Peak Flow --      Pain Score 04/16/24 1633 6     Pain Loc --      Pain Education --      Exclude from Growth Chart --   UFJK(75)@     _________________________________________ Significant initial  Findings: Abnormal Labs Reviewed  BASIC METABOLIC PANEL WITH GFR - Abnormal; Notable for the following components:      Result Value   CO2 17 (*)    Glucose, Bld 188 (*)    Calcium  7.3 (*)    All other components within normal limits  MAGNESIUM  - Abnormal; Notable for the following components:   Magnesium  1.1 (*)    All other components within normal limits  CBC - Abnormal; Notable for the following components:   RBC 3.75 (*)    Hemoglobin 11.5 (*)    HCT 35.6 (*)    RDW 18.8 (*)    All other components within normal limits  RAPID URINE DRUG SCREEN, HOSP PERFORMED -  Abnormal; Notable for the following components:   Cocaine POSITIVE (*)    Tetrahydrocannabinol POSITIVE (*)    All other components within normal limits  URINALYSIS, ROUTINE W REFLEX MICROSCOPIC - Abnormal; Notable for the following components:   Color, Urine STRAW (*)    Specific Gravity, Urine 1.004 (*)    All other components within normal limits  HEPATIC FUNCTION PANEL - Abnormal; Notable for the following components:   Total Protein 6.3 (*)    Albumin  3.0 (*)    Indirect Bilirubin 0.1 (*)    All other components within normal limits    _________________________ Troponin which 1   ordered Cardiac Panel (last 3 results) Recent Labs    04/16/24 1643 04/16/24 1844  TROPONINIHS 6 7     ECG: Ordered Personally reviewed and interpreted by me showing: HR : 120 Rhythm:  A.fib. W RVR,    no evidence of ischemic changes QTC 470  BNP (last 3 results) Recent Labs    08/13/23 1001 02/20/24 1431 04/16/24 1644  BNP 423.9* 100.3* 90.7    The recent clinical data is shown below. Vitals:   04/16/24 2130 04/16/24 2145 04/16/24 2245 04/16/24 2300  BP: 111/78 105/79    Pulse: (!) 124 (!) 117 88 (!) 104  Resp: 17 16 19 13   Temp:      TempSrc:      SpO2: 98% 100% 99% 99%  Weight:      Height:  WBC     Component Value Date/Time   WBC 9.3 04/16/2024 1643   LYMPHSABS 1.9 04/06/2024 1156   LYMPHSABS 2.4 05/22/2020 1451   MONOABS 1.0 04/06/2024 1156   EOSABS 0.1 04/06/2024 1156   EOSABS 0.1 05/22/2020 1451   BASOSABS 0.0 04/06/2024 1156   BASOSABS 0.0 05/22/2020 1451          UA   no evidence of UTI     Urine analysis:    Component Value Date/Time   COLORURINE STRAW (A) 04/16/2024 1644   APPEARANCEUR CLEAR 04/16/2024 1644   LABSPEC 1.004 (L) 04/16/2024 1644   PHURINE 6.0 04/16/2024 1644   GLUCOSEU NEGATIVE 04/16/2024 1644   HGBUR NEGATIVE 04/16/2024 1644   HGBUR negative 04/30/2010 1020   BILIRUBINUR NEGATIVE 04/16/2024 1644   BILIRUBINUR negative  09/06/2019 1649   KETONESUR NEGATIVE 04/16/2024 1644   PROTEINUR NEGATIVE 04/16/2024 1644   UROBILINOGEN 0.2 09/06/2019 1649   UROBILINOGEN 0.2 03/15/2015 0508   NITRITE NEGATIVE 04/16/2024 1644   LEUKOCYTESUR NEGATIVE 04/16/2024 1644     ________________________________________________________________  Venous  Blood Gas result:  pH 7.37 O2 Saturation 96.4 %   pCO2, Ven 34 Low  mmHg Patient temperature 36.2  pO2, Ven 71 High  mmHg      ABG    Component Value Date/Time   PHART 7.33 (L) 08/11/2023 1537   PCO2ART 24 (L) 08/11/2023 1537   PO2ART 72 (L) 08/11/2023 1537   HCO3 12.7 (L) 08/11/2023 1537   TCO2 13 (L) 02/20/2024 1436   ACIDBASEDEF 11.3 (H) 08/11/2023 1537   O2SAT 96.8 08/11/2023 1537   ____________________________________________________ Recent Labs  Lab 04/16/24 1643  NA 138  K 4.0  CO2 17*  GLUCOSE 188*  BUN 6  CREATININE 0.96  CALCIUM  7.3*  MG 1.1*    Cr  stable,    Lab Results  Component Value Date   CREATININE 0.96 04/16/2024   CREATININE 0.92 04/06/2024   CREATININE 0.90 04/02/2024    Recent Labs  Lab 04/16/24 1844  AST 37  ALT 23  ALKPHOS 69  BILITOT 0.3  PROT 6.3*  ALBUMIN  3.0*   Lab Results  Component Value Date   CALCIUM  7.3 (L) 04/16/2024   PHOS 3.9 11/12/2023    Plt: Lab Results  Component Value Date   PLT 196 04/16/2024    Recent Labs  Lab 04/16/24 1643  WBC 9.3  HGB 11.5*  HCT 35.6*  MCV 94.9  PLT 196    HG/HCT  stable,       Component Value Date/Time   HGB 11.5 (L) 04/16/2024 1643   HGB 11.9 (L) 05/22/2020 1451   HCT 35.6 (L) 04/16/2024 1643   HCT 36.5 (L) 05/22/2020 1451   MCV 94.9 04/16/2024 1643   MCV 100 (H) 05/22/2020 1451     Recent Labs  Lab 04/16/24 1844  LIPASE 21   No results for input(s): AMMONIA in the last 168 hours.    _______________________________________________ Hospitalist was called for admission for   Atrial fibrillation with RVR  in the setting of cocaine and WPW   The  following Work up has been ordered so far:  Orders Placed This Encounter  Procedures   DG Chest Port 1 View   CT CHEST ABDOMEN PELVIS WO CONTRAST   Basic metabolic panel   Magnesium    CBC   TSH   Brain natriuretic peptide (IF shortness of breath has been documented this visit)   Ethanol   Rapid urine drug screen (hospital performed)  Urinalysis, Routine w reflex microscopic -Urine, Clean Catch   Hepatic function panel   Lipase, blood   Heparin  level (unfractionated)   Heparin  level (unfractionated)   CBC   Diet NPO time specified   ED Cardiac monitoring   Initiate Carrier Fluid Protocol   Consult for [facility] Medical Admission   heparin  per pharmacy consult   Oxygen  therapy Mode or (Route): Other Specify; Comments: [place] or Simple Mask - to keep O2 sats >92%   ED Pulse oximetry, continuous   EKG 12-Lead   ED EKG   EKG 12-Lead   EKG 12-Lead   EKG 12-Lead   Insert peripheral IV     OTHER Significant initial  Findings:  labs showing:     DM  labs:  HbA1C: Recent Labs    08/14/23 0935  HGBA1C 4.8       CBG (last 3)  No results for input(s): GLUCAP in the last 72 hours.        Cultures:    Component Value Date/Time   SDES URINE, CLEAN CATCH 11/20/2020 1221   SPECREQUEST NONE 11/20/2020 1221   CULT  11/20/2020 1221    NO GROWTH Performed at [facility], [address], [place], [place] [address]    REPTSTATUS 11/21/2020 FINAL 11/20/2020 1221     Radiological Exams on Admission: CT CHEST ABDOMEN PELVIS WO CONTRAST Result Date: 04/16/2024 CLINICAL DATA:  Unintended weight loss, history of chest pain EXAM: CT CHEST, ABDOMEN AND PELVIS WITHOUT CONTRAST TECHNIQUE: Multidetector CT imaging of the chest, abdomen and pelvis was performed following the standard protocol without IV contrast. RADIATION DOSE REDUCTION: This exam was performed according to the departmental dose-optimization program which includes automated exposure control, adjustment of the mA  and/or kV according to patient size and/or use of iterative reconstruction technique. COMPARISON:  04/16/2024, 03/26/2024 FINDINGS: CT CHEST FINDINGS Cardiovascular: Unenhanced imaging the heart is unremarkable without pericardial effusion. Normal caliber of the thoracic aorta. Mediastinum/Nodes: No enlarged mediastinal, hilar, or axillary lymph nodes. Thyroid  gland, trachea, and esophagus demonstrate no significant findings. Lungs/Pleura: Biapical emphysematous changes. No acute airspace disease, effusion, or pneumothorax. Central airways are patent. Musculoskeletal: No acute or destructive bony abnormalities. Reconstructed images demonstrate no additional findings. CT ABDOMEN PELVIS FINDINGS Hepatobiliary: Unremarkable unenhanced appearance of the liver and gallbladder. Pancreas: Stable 4.4 x 4.2 cm pseudocyst within the pancreatic body. No acute inflammatory changes. Calcifications within the pancreatic parenchyma consistent with sequela of chronic calcific pancreatitis. Spleen: Unremarkable unenhanced appearance. Adrenals/Urinary Tract: No urinary tract calculi or obstructive uropathy. Unremarkable appearance of the adrenals and bladder. Stomach/Bowel: No bowel obstruction or ileus. Normal appendix right mid abdomen. There is continued diffuse gastric wall thickening, unchanged since numerous previous exams. Please correlate with previous upper endoscopy results. Vascular/Lymphatic: Aortic atherosclerosis. No enlarged abdominal or pelvic lymph nodes. Reproductive: Prostate is unremarkable. Other: No free fluid or free intraperitoneal gas. No abdominal wall hernia. Musculoskeletal: No acute or destructive bony abnormalities. Reconstructed images demonstrate no additional findings. IMPRESSION: 1. Chronic diffuse gastric wall thickening, unchanged since numerous previous exams. Please correlate with previous upper endoscopy results. 2. Sequela of chronic calcific pancreatitis, with stable 4.4 cm pseudocyst. No  acute inflammatory changes. 3. No acute intra-abdominal scarred set no acute intrathoracic, intra-abdominal, or intrapelvic process. 4.  Aortic Atherosclerosis (ICD10-I70.0). 5. Pulmonary emphysema is present. Pulmonary emphysema is an independent risk factor for lung cancer. Recommend patient be considered for lung cancer screening. [place]  Chief Financial Officer. Screening for Lung Cancer: [place]  Chief Financial Officer  Recommendation Statement. JAMA. 2021;325(10):962-970. Electronically Signed   By: Ozell Daring M.D.   On: 04/16/2024 20:49   DG Chest Port 1 View Result Date: 04/16/2024 CLINICAL DATA:  Chest pain. EXAM: PORTABLE CHEST 1 VIEW COMPARISON:  04/02/2024. FINDINGS: The heart size and mediastinal contours are within normal limits. No focal consolidation, pleural effusion, or pneumothorax. No acute osseous abnormality. IMPRESSION: No acute cardiopulmonary findings. Electronically Signed   By: Harrietta Sherry M.D.   On: 04/16/2024 17:40   _______________________________________________________________________________________________________ Latest  Blood pressure 105/79, pulse (!) 104, temperature (!) 97.2 F (36.2 C), temperature source Oral, resp. rate 13, height 5' 8 (1.727 m), weight 53.5 kg, SpO2 99%.   Vitals  labs and radiology finding personally reviewed  Review of Systems:    Pertinent positives include:  fatigue, shortness of breath at rest.   dyspnea on exertion,  Constitutional:  No weight loss, night sweats, Fevers, chills,  weight loss  HEENT:  No headaches, Difficulty swallowing,Tooth/dental problems,Sore throat,  No sneezing, itching, ear ache, nasal congestion, post nasal drip,  Cardio-vascular:  No chest pain, Orthopnea, PND, anasarca, dizziness, palpitations.no Bilateral lower extremity swelling  GI:  No heartburn, indigestion, abdominal pain, nausea, vomiting, diarrhea, change in bowel habits, loss of appetite, melena, blood in stool, hematemesis Resp:   no No excess mucus, no productive cough, No non-productive cough, No coughing up of blood.No change in color of mucus.No wheezing. Skin:  no rash or lesions. No jaundice GU:  no dysuria, change in color of urine, no urgency or frequency. No straining to urinate.  No flank pain.  Musculoskeletal:  No joint pain or no joint swelling. No decreased range of motion. No back pain.  Psych:  No change in mood or affect. No depression or anxiety. No memory loss.  Neuro: no localizing neurological complaints, no tingling, no weakness, no double vision, no gait abnormality, no slurred speech, no confusion  All systems reviewed and apart from HOPI all are negative _______________________________________________________________________________________________ Past Medical History:   Past Medical History:  Diagnosis Date   Alcoholism /alcohol  abuse    Anemia    Anxiety    Arthritis    knees; arms; elbows (03/26/2015)   Asthma    Bipolar disorder (HCC)    Chronic bronchitis (HCC)    Chronic lower back pain    Chronic pancreatitis (HCC)    Cocaine abuse (HCC)    Depression    Family history of adverse reaction to anesthesia    Femoral condyle fracture (HCC) 03/08/2014   left medial/notes 03/09/2014   GERD (gastroesophageal reflux disease)    H/O hiatal hernia    H/O suicide attempt 10/2012   High cholesterol    History of blood transfusion 10/2012   when I tried to commit suicide   History of stomach ulcers    Hypertension    Marijuana abuse, continuous    Migraine    a few times/year (03/26/2015)   Pancreatitis    Pneumonia 1990's X 3   PTSD (post-traumatic stress disorder)    Seizures (HCC)    Sickle cell trait    WPW (Wolff-Parkinson-White syndrome)    thelbert 03/06/2013      Past Surgical History:  Procedure Laterality Date   BIOPSY  11/25/2017   Procedure: BIOPSY;  Surgeon: Burnette Fallow, MD;  Location: [facility] ENDOSCOPY;  Service: Endoscopy;;   BIOPSY  10/14/2018    Procedure: BIOPSY;  Surgeon: Burnette Fallow, MD;  Location: [facility] ENDOSCOPY;  Service: Endoscopy;;   BIOPSY  03/06/2023  Procedure: BIOPSY;  Surgeon: Wilhelmenia Aloha Raddle., MD;  Location: [facility] ENDOSCOPY;  Service: Gastroenterology;;   CARDIAC CATHETERIZATION     CYST ENTEROSTOMY  01/02/2020   Procedure: CYST ASPIRATION;  Surgeon: Teressa Toribio SQUIBB, MD;  Location: [facility] ENDOSCOPY;  Service: Endoscopy;;   ESOPHAGOGASTRODUODENOSCOPY N/A 03/06/2023   Procedure: ESOPHAGOGASTRODUODENOSCOPY (EGD);  Surgeon: Wilhelmenia Aloha Raddle., MD;  Location: [facility] ENDOSCOPY;  Service: Gastroenterology;  Laterality: N/A;   ESOPHAGOGASTRODUODENOSCOPY (EGD) WITH PROPOFOL  N/A 11/25/2017   Procedure: ESOPHAGOGASTRODUODENOSCOPY (EGD) WITH PROPOFOL ;  Surgeon: Burnette Fallow, MD;  Location: [facility] ENDOSCOPY;  Service: Endoscopy;  Laterality: N/A;   ESOPHAGOGASTRODUODENOSCOPY (EGD) WITH PROPOFOL  Left 10/14/2018   Procedure: ESOPHAGOGASTRODUODENOSCOPY (EGD) WITH PROPOFOL ;  Surgeon: Burnette Fallow, MD;  Location: [facility] ENDOSCOPY;  Service: Endoscopy;  Laterality: Left;   ESOPHAGOGASTRODUODENOSCOPY (EGD) WITH PROPOFOL  N/A 11/14/2018   Procedure: ESOPHAGOGASTRODUODENOSCOPY (EGD) WITH PROPOFOL ;  Surgeon: Celestia Agent, MD;  Location: [facility] ENDOSCOPY;  Service: Gastroenterology;  Laterality: N/A;   ESOPHAGOGASTRODUODENOSCOPY (EGD) WITH PROPOFOL  N/A 01/02/2020   Procedure: ESOPHAGOGASTRODUODENOSCOPY (EGD) WITH PROPOFOL ;  Surgeon: Teressa Toribio SQUIBB, MD;  Location: [facility] ENDOSCOPY;  Service: Endoscopy;  Laterality: N/A;   ESOPHAGOGASTRODUODENOSCOPY (EGD) WITH PROPOFOL  N/A 10/25/2020   Procedure: ESOPHAGOGASTRODUODENOSCOPY (EGD) WITH PROPOFOL ;  Surgeon: Wilhelmenia Aloha Raddle., MD;  Location: [facility] ENDOSCOPY;  Service: Gastroenterology;  Laterality: N/A;   EUS N/A 01/02/2020   Procedure: UPPER ENDOSCOPIC ULTRASOUND (EUS) RADIAL;  Surgeon: Teressa Toribio SQUIBB, MD;  Location: [facility] ENDOSCOPY;  Service: Endoscopy;  Laterality: N/A;   EUS N/A 03/06/2023   Procedure: UPPER  ENDOSCOPIC ULTRASOUND (EUS) RADIAL;  Surgeon: Wilhelmenia Aloha Raddle., MD;  Location: [facility] ENDOSCOPY;  Service: Gastroenterology;  Laterality: N/A;   EYE SURGERY Left 1990's   result of trauma    FACIAL FRACTURE SURGERY Left 1990's   result of trauma    FINE NEEDLE ASPIRATION N/A 03/06/2023   Procedure: FINE NEEDLE ASPIRATION (FNA) LINEAR;  Surgeon: Wilhelmenia Aloha Raddle., MD;  Location: [facility] ENDOSCOPY;  Service: Gastroenterology;  Laterality: N/A;   FLEXIBLE SIGMOIDOSCOPY N/A 10/25/2020   Procedure: FLEXIBLE SIGMOIDOSCOPY;  Surgeon: Wilhelmenia Aloha Raddle., MD;  Location: [facility] ENDOSCOPY;  Service: Gastroenterology;  Laterality: N/A;   FRACTURE SURGERY     HEMOSTASIS CLIP PLACEMENT  10/25/2020   Procedure: HEMOSTASIS CLIP PLACEMENT;  Surgeon: Wilhelmenia Aloha Raddle., MD;  Location: [facility] ENDOSCOPY;  Service: Gastroenterology;;   HERNIA REPAIR     HOT HEMOSTASIS N/A 03/06/2023   Procedure: HOT HEMOSTASIS (ARGON PLASMA COAGULATION/BICAP);  Surgeon: Wilhelmenia Aloha Raddle., MD;  Location: [facility] ENDOSCOPY;  Service: Gastroenterology;  Laterality: N/A;   LEFT HEART CATHETERIZATION WITH CORONARY ANGIOGRAM Right 03/07/2013   Procedure: LEFT HEART CATHETERIZATION WITH CORONARY ANGIOGRAM;  Surgeon: Salena GORMAN Negri, MD;  Location: [facility] CATH LAB;  Service: Cardiovascular;  Laterality: Right;   UMBILICAL HERNIA REPAIR     UPPER GASTROINTESTINAL ENDOSCOPY      Social History:  Ambulatory   independently      reports that he has been smoking cigarettes. He has a 36 pack-year smoking history. He has never used smokeless tobacco. He reports current alcohol  use. He reports current drug use. Drugs: Marijuana and Cocaine.     Family History:   Family History  Problem Relation Age of Onset   Hypertension Mother    Cirrhosis Father    Alcoholism Father    Hypertension Father    Melanoma Father    Hypertension Other    Coronary artery disease Other     ______________________________________________________________________________________________ Allergies: Allergies  Allergen Reactions   Robaxin  [Methocarbamol ] Other (See  Comments)    Myoclonus    Bayer Aspirin  [Aspirin ] Other (See Comments)    Unknown reaction   Trazodone  And Nefazodone Other (See Comments)    Muscle spasms   Adhesive [Tape] Itching   Latex Itching   Toradol  [Ketorolac  Tromethamine ] Other (See Comments)    Hx ulcers   Contrast Media [Iodinated Contrast Media] Hives   Fish Allergy Nausea And Vomiting and Rash   Reglan  [Metoclopramide ] Other (See Comments)    Muscle spasms   Salmon [Fish Oil] Nausea And Vomiting and Rash   Shellfish Allergy Nausea And Vomiting and Rash     Prior to Admission medications   Medication Sig Start Date End Date Taking? Authorizing Provider  acetaminophen  (TYLENOL ) 500 MG tablet Take 1,000 mg by mouth daily as needed for mild pain (pain score 1-3) or moderate pain (pain score 4-6).   Yes [provider]  albuterol  (VENTOLIN  HFA) 108 (90 Base) MCG/ACT inhaler Inhale 2 puffs into the lungs every 6 (six) hours as needed for wheezing or shortness of breath. 04/03/24  Yes [provider]  calcium  carbonate (TUMS - DOSED IN MG ELEMENTAL CALCIUM ) 500 MG chewable tablet Chew 2 tablets (400 mg of elemental calcium  total) by mouth 3 (three) times daily as needed for indigestion or heartburn. 03/01/24  Yes Theophilus Pagan, MD  carvedilol  (COREG ) 25 MG tablet Take 1 tablet (25 mg total) by mouth 2 (two) times daily with a meal. 12/08/23  Yes West, Katlyn D, NP  cyclobenzaprine  (FLEXERIL ) 10 MG tablet Take 1 tablet (10 mg total) by mouth 3 (three) times daily as needed for muscle spasms. 04/06/24  Yes Kammerer, Megan L, DO  diltiazem  (CARDIZEM  CD) 240 MG 24 hr capsule Take 1 capsule (240 mg total) by mouth 2 (two) times daily. 03/01/24  Yes Theophilus Pagan, MD  famotidine  (PEPCID ) 40 MG tablet Take 1 tablet (40 mg total) by mouth  daily. Patient taking differently: Take 40 mg by mouth 2 (two) times daily. 10/17/23  Yes Henderly, Britni A, PA-C  feeding supplement (ENSURE PLUS HIGH PROTEIN) LIQD Take 237 mLs by mouth 3 (three) times daily between meals. 03/01/24  Yes Theophilus Pagan, MD  fluticasone  (FLONASE ) 50 MCG/ACT nasal spray Place 2 sprays into both nostrils daily. 04/03/24 04/03/25 Yes [provider]  folic acid  (FOLVITE ) 1 MG tablet Take 1 tablet (1 mg total) by mouth daily. 03/01/24  Yes Theophilus Pagan, MD  lipase/protease/amylase (CREON ) 12000-38000 units CPEP capsule Take 2 capsules (24,000 units of lipase total) by mouth Three (3) times a day with a meal. May also take 1 capsule (12,000 units of lipase total) with snacks (up to 3 snacks daily). 03/10/24  Yes [provider]  loperamide  (IMODIUM  A-D) 2 MG tablet Take 2 mg by mouth 4 (four) times daily as needed for diarrhea or loose stools.   Yes [provider]  magnesium  oxide (MAG-OX) 400 (240 Mg) MG tablet Take 1 tablet (400 mg total) by mouth daily. 11/07/23  Yes Hoy Fraction F, PA-C  Multiple Vitamin (MULTIVITAMIN WITH MINERALS) TABS tablet Take 1 tablet by mouth daily. 08/21/23  Yes Ghimire, Donalda HERO, MD  nicotine  (NICODERM CQ  - DOSED IN MG/24 HOURS) 21 mg/24hr patch Place 1 patch (21 mg total) onto the skin daily. Patient taking differently: Place 21 mg onto the skin daily as needed (for smoking cessation). 11/14/23  Yes Amin, Ankit C, MD  nicotine  polacrilex (NICORETTE ) 2 MG gum Take 1 each (2 mg total) by mouth as needed for smoking  cessation. 03/01/24  Yes Theophilus Pagan, MD  ondansetron  (ZOFRAN -ODT) 4 MG disintegrating tablet Take 1 tablet (4 mg total) by mouth every 8 (eight) hours as needed. 03/26/24  Yes Long, Fonda MATSU, MD  Oxycodone  HCl 10 MG TABS Take 10 mg by mouth 4 (four) times daily as needed. 04/03/24  Yes [provider]  pantoprazole  (PROTONIX ) 40 MG tablet Take 1 tablet (40 mg total) by mouth daily.  01/18/24  Yes Kehrli, Kelsey F, PA-C  polyethylene glycol powder (GLYCOLAX /MIRALAX ) 17 GM/SCOOP powder Take 17 g by mouth daily as needed for mild constipation. 11/13/23  Yes Amin, Ankit C, MD  promethazine  (PHENERGAN ) 25 MG tablet Take 1 tablet (25 mg total) by mouth every 8 (eight) hours as needed for up to 4 days for nausea or vomiting. 04/06/24 04/16/25 Yes Kammerer, Megan L, DO  sucralfate  (CARAFATE ) 1 GM/10ML suspension Take 1 g by mouth 4 (four) times daily -  with meals and at bedtime.   Yes [provider]  tamsulosin  (FLOMAX ) 0.4 MG CAPS capsule Take 1 capsule (0.4 mg total) by mouth daily. 03/01/24  Yes Theophilus Pagan, MD  thiamine  (VITAMIN B-1) 100 MG tablet Take 1 tablet (100 mg total) by mouth daily. 03/01/24  Yes Theophilus Pagan, MD  ferrous sulfate  325 (65 FE) MG tablet Take 1 tablet (325 mg total) by mouth daily with breakfast. Patient not taking: Reported on 04/16/2024 03/01/24   Theophilus Pagan, MD  naloxone  (NARCAN ) nasal spray 4 mg/0.1 mL Place 1 spray into the nose daily as needed (Opioid overdose). 03/01/24   Theophilus Pagan, MD  senna-docusate (SENOKOT-S) 8.6-50 MG tablet Take 1 tablet by mouth at bedtime as needed for mild constipation. Patient not taking: Reported on 04/16/2024 03/26/24   Darra Fonda MATSU, MD  amitriptyline  (ELAVIL ) 25 MG tablet Take 1 tablet (25 mg total) by mouth at bedtime. Patient not taking: Reported on 08/08/2019 10/15/18 08/08/19  Tobie Yetta HERO, MD    ___________________________________________________________________________________________________ Physical Exam:    04/16/2024   11:00 PM 04/16/2024   10:45 PM 04/16/2024    9:45 PM  Vitals with BMI  Systolic   105  Diastolic   79  Pulse 104 88 117     1. General:  in No  Acute distress   Chronically ill   -appearing 2. Psychological: Alert and   Oriented 3. Head/ENT:   Dry Mucous Membranes                          Head Non traumatic, neck supple                      Poor  Dentition 4. SKIN:  decreased Skin turgor,  Skin clean Dry and intact no rash    5. Heart: Regular rate and rhythm no  Murmur, no Rub or gallop 6. Lungs:  no wheezes or crackles   7. Abdomen: Soft, epigastric tender, Non distended  bowel sounds present 8. Lower extremities: no clubbing, cyanosis, no  edema 9. Neurologically Grossly intact, moving all 4 extremities equally tremulous 10. MSK: Normal range of motion    Chart has been reviewed  ______________________________________________________________________________________________  Assessment/Plan  53 y.o. male with medical history significant of chronic pancreatitis,, Wolf Parkinson White syndrome A-fib, peptic ulcer disease, asthma, seizure-like activity, alcohol  abuse, polysubstance abuse, anemia, hypertension, bipolar, tobacco abuse, chronic diastolic chf  Admitted for Atrial fibrillation with RVR  in the setting of cocaine and WPW   Present  on Admission:  Paroxysmal atrial fibrillation (HCC)  Alcohol  abuse  Tobacco abuse  Abdominal pain  Asthma, chronic  Cocaine abuse (HCC)  GERD (gastroesophageal reflux disease)  Chronic alcoholic pancreatitis (HCC)  Hypomagnesemia  Normocytic anemia  WPW (Wolff-Parkinson-White syndrome)  Atrial fibrillation with RVR (HCC)     Paroxysmal atrial fibrillation (HCC) In the setting of WPW syndrome Appreciate cardiology consult With difficult to manage heart rate Patient at baseline on Coreg  Recent cocaine on board In emergency department started on diltiazem  drip heart rate now in 110 Patient is symptomatically improving Cardiology recommending restarting home diltiazem  when stable or if need additional HR control at night  continuing heparin   Alcohol  abuse CIWA protocol In the past patient with history of seizures in the setting of alcohol  withdrawal monitor for any sign of alcohol  withdrawal monitor on progressive  Tobacco abuse  - Spoke about importance of quitting spent 5  minutes discussing options for treatment, prior attempts at quitting, and dangers of smoking  -At this point patient is   NOT  interested in quitting  - order nicotine  patch   - nursing tobacco cessation protocol   Abdominal pain History of chronic pancreatitis Peptic ulcer disease Chronic diffuse gastric wall thickening on CT unchanged from prior CT scan nonacute  Asthma, chronic Xopenex  as needed  Cocaine abuse (HCC) Emphasized the patient importance of abstaining from cocaine  GERD (gastroesophageal reflux disease) Continue Protonix   Chronic alcoholic pancreatitis (HCC) Continue continue Creon   Hypomagnesemia Replace and recheck  Normocytic anemia Obtain anemia panel  Transfuse for Hg <7 , rapidly dropping or  if symptomatic   Seizure (HCC) History of seizures from alcohol  withdrawal will monitor for any evidence of alcohol  withdrawal now  WPW (Wolff-Parkinson-White syndrome) Appreciate cardiology consult   Other plan as per orders.  DVT prophylaxis: heparin     Code Status:    Code Status: Prior FULL CODE   as per patient   I had personally discussed CODE STATUS with patient and family*  ACP   none    Family Communication:   Family not at  Bedside    Diet  Diet Orders (From admission, onward)     Start     Ordered   04/16/24 1643  Diet NPO time specified  Diet effective now        04/16/24 1644            Disposition Plan:     To home once workup is complete and patient is stable   Following barriers for discharge:                             Chest pain work up is complete                            Electrolytes corrected                               Anemia   stable                             Pain controlled with PO medications   Consults called: Cardiology    Admission status:  ED Disposition     ED Disposition  Admit   Condition  --   Comment  Hospital Area: [facility] [[id number]]  Level of Care: Progressive [102]   Admit to Progressive based on following criteria: CARDIOVASCULAR & THORACIC of moderate stability with acute coronary syndrome symptoms/low risk myocardial infarction/hypertensive urgency/arrhythmias/heart failure potentially compromising stability and stable post cardiovascular intervention patients.  May place patient in observation at [facility] or [facility] if equivalent level of care is available:: No  Diagnosis: Atrial fibrillation with RVR [facility]) [[id number]]  Admitting Physician: Vineeth Fell [[id number]]  Attending Physician: Kimari Lienhard [[id number]]          Obs     Level of care  progressive        Emmanuel Gruenhagen 04/17/2024, 1:27 AM    [facility]     after 2 AM please page floor coverage   If 7AM-7PM, please contact the day team taking care of the patient using [email or web address]

## 2024-04-16 NOTE — Subjective & Objective (Addendum)
 Patient was brought in by EMS with chest pain and shortness of breath initial heart rate was in 200s he was given 20 of diltiazem  which brought heart rate down to 170  Initial EKG worrisome for atrial fibrillation with RVR blood pressure 115/98 patient was not hypoxic.  Placed on 3 L of O2 for comfort he has known history of cocaine abuse and WPW as well as known history of atrial fibrillation He is supposed to be taking Coreg  and diltiazem  but ran out of his diltiazem  and has been taking his old metoprolol  instead Last time cocaine use was 5 days ago He continues to drink and last alcoholic drink was yesterday Reports the palpitations started last night and continued Today he also started having chest pain abdominal pain and worsening shortness of breath that became more severe with any exertion and not when he called EMS EKG confirmed A-fib he was started on diltiazem  drip titrated to 15 and heparin  was ordered

## 2024-04-16 NOTE — ED Triage Notes (Signed)
 Patient arrives via [facility] for afib RVR. Patient endorses associated chest pain and shob. Patient's HR was initially 200, given 20 dilt down to 170. Patient alert and oriented x4. 22 RAC.  12 lead afib. 50mcg fentanyl  en route.   EMS vitals BP 115/98 HR 170 O2 99 on room air but placed on 3L for comfort

## 2024-04-16 NOTE — Consult Note (Signed)
 Cardiology Consultation   Patient ID: [name] MRN: [id number]; DOB: 1970-07-16  Admit date: 04/16/2024 Date of Consult: 04/16/2024  PCP:  Myra Wanda LABOR, MD   [facility] Providers Cardiologist:  Lynwood Schilling, MD  Cardiology APP:  Madie Jon Garre, PA  { Click here to update MD or APP on Care Team, Refresh:1}     Patient Profile: [name] is a 53 y.o. male with a hx of wolf-parkinson-white syndrome s/p ablation in 2015, AF, substance use  who is being seen 04/16/2024 for the evaluation of AF at the request of the ED.  History of Present Illness: Mr. Molinelli ***        Past Medical History:  Diagnosis Date   Alcoholism /alcohol  abuse    Anemia    Anxiety    Arthritis    knees; arms; elbows (03/26/2015)   Asthma    Bipolar disorder (HCC)    Chronic bronchitis (HCC)    Chronic lower back pain    Chronic pancreatitis (HCC)    Cocaine abuse (HCC)    Depression    Family history of adverse reaction to anesthesia    Femoral condyle fracture (HCC) 03/08/2014   left medial/notes 03/09/2014   GERD (gastroesophageal reflux disease)    H/O hiatal hernia    H/O suicide attempt 10/2012   High cholesterol    History of blood transfusion 10/2012   when I tried to commit suicide   History of stomach ulcers    Hypertension    Marijuana abuse, continuous    Migraine    a few times/year (03/26/2015)   Pancreatitis    Pneumonia 1990's X 3   PTSD (post-traumatic stress disorder)    Seizures (HCC)    Sickle cell trait    WPW (Wolff-Parkinson-White syndrome)    thelbert 03/06/2013    Past Surgical History:  Procedure Laterality Date   BIOPSY  11/25/2017   Procedure: BIOPSY;  Surgeon: Burnette Fallow, MD;  Location: [facility] ENDOSCOPY;  Service: Endoscopy;;   BIOPSY  10/14/2018   Procedure: BIOPSY;  Surgeon: Burnette Fallow, MD;  Location: [facility] ENDOSCOPY;  Service: Endoscopy;;   BIOPSY  03/06/2023   Procedure: BIOPSY;  Surgeon: Wilhelmenia Aloha Raddle., MD;  Location: [facility];  Service: Gastroenterology;;   CARDIAC CATHETERIZATION     CYST ENTEROSTOMY  01/02/2020   Procedure: CYST ASPIRATION;  Surgeon: Teressa Toribio SQUIBB, MD;  Location: [facility] ENDOSCOPY;  Service: Endoscopy;;   ESOPHAGOGASTRODUODENOSCOPY N/A 03/06/2023   Procedure: ESOPHAGOGASTRODUODENOSCOPY (EGD);  Surgeon: Wilhelmenia Aloha Raddle., MD;  Location: [facility];  Service: Gastroenterology;  Laterality: N/A;   ESOPHAGOGASTRODUODENOSCOPY (EGD) WITH PROPOFOL  N/A 11/25/2017   Procedure: ESOPHAGOGASTRODUODENOSCOPY (EGD) WITH PROPOFOL ;  Surgeon: Burnette Fallow, MD;  Location: [facility] ENDOSCOPY;  Service: Endoscopy;  Laterality: N/A;   ESOPHAGOGASTRODUODENOSCOPY (EGD) WITH PROPOFOL  Left 10/14/2018   Procedure: ESOPHAGOGASTRODUODENOSCOPY (EGD) WITH PROPOFOL ;  Surgeon: Burnette Fallow, MD;  Location: [facility] ENDOSCOPY;  Service: Endoscopy;  Laterality: Left;   ESOPHAGOGASTRODUODENOSCOPY (EGD) WITH PROPOFOL  N/A 11/14/2018   Procedure: ESOPHAGOGASTRODUODENOSCOPY (EGD) WITH PROPOFOL ;  Surgeon: Celestia Lynwood, MD;  Location: [facility] ENDOSCOPY;  Service: Gastroenterology;  Laterality: N/A;   ESOPHAGOGASTRODUODENOSCOPY (EGD) WITH PROPOFOL  N/A 01/02/2020   Procedure: ESOPHAGOGASTRODUODENOSCOPY (EGD) WITH PROPOFOL ;  Surgeon: Teressa Toribio SQUIBB, MD;  Location: [facility] ENDOSCOPY;  Service: Endoscopy;  Laterality: N/A;   ESOPHAGOGASTRODUODENOSCOPY (EGD) WITH PROPOFOL  N/A 10/25/2020   Procedure: ESOPHAGOGASTRODUODENOSCOPY (EGD) WITH PROPOFOL ;  Surgeon: Wilhelmenia Aloha Raddle., MD;  Location: [facility] ENDOSCOPY;  Service: Gastroenterology;  Laterality: N/A;   EUS  N/A 01/02/2020   Procedure: UPPER ENDOSCOPIC ULTRASOUND (EUS) RADIAL;  Surgeon: Teressa Toribio SQUIBB, MD;  Location: [facility] ENDOSCOPY;  Service: Endoscopy;  Laterality: N/A;   EUS N/A 03/06/2023   Procedure: UPPER ENDOSCOPIC ULTRASOUND (EUS) RADIAL;  Surgeon: Wilhelmenia Aloha Raddle., MD;  Location: [facility] ENDOSCOPY;  Service: Gastroenterology;  Laterality: N/A;   EYE SURGERY  Left 1990's   result of trauma    FACIAL FRACTURE SURGERY Left 1990's   result of trauma    FINE NEEDLE ASPIRATION N/A 03/06/2023   Procedure: FINE NEEDLE ASPIRATION (FNA) LINEAR;  Surgeon: Wilhelmenia Aloha Raddle., MD;  Location: [facility] ENDOSCOPY;  Service: Gastroenterology;  Laterality: N/A;   FLEXIBLE SIGMOIDOSCOPY N/A 10/25/2020   Procedure: FLEXIBLE SIGMOIDOSCOPY;  Surgeon: Wilhelmenia Aloha Raddle., MD;  Location: [facility] ENDOSCOPY;  Service: Gastroenterology;  Laterality: N/A;   FRACTURE SURGERY     HEMOSTASIS CLIP PLACEMENT  10/25/2020   Procedure: HEMOSTASIS CLIP PLACEMENT;  Surgeon: Wilhelmenia Aloha Raddle., MD;  Location: [facility] ENDOSCOPY;  Service: Gastroenterology;;   HERNIA REPAIR     HOT HEMOSTASIS N/A 03/06/2023   Procedure: HOT HEMOSTASIS (ARGON PLASMA COAGULATION/BICAP);  Surgeon: Wilhelmenia Aloha Raddle., MD;  Location: [facility];  Service: Gastroenterology;  Laterality: N/A;   LEFT HEART CATHETERIZATION WITH CORONARY ANGIOGRAM Right 03/07/2013   Procedure: LEFT HEART CATHETERIZATION WITH CORONARY ANGIOGRAM;  Surgeon: Salena GORMAN Negri, MD;  Location: [facility] CATH LAB;  Service: Cardiovascular;  Laterality: Right;   UMBILICAL HERNIA REPAIR     UPPER GASTROINTESTINAL ENDOSCOPY       Home Medications:  Prior to Admission medications   Medication Sig Start Date End Date Taking? Authorizing Provider  acetaminophen  (TYLENOL ) 500 MG tablet Take 1,000 mg by mouth daily as needed for mild pain (pain score 1-3) or moderate pain (pain score 4-6).   Yes [provider]  albuterol  (VENTOLIN  HFA) 108 (90 Base) MCG/ACT inhaler Inhale 2 puffs into the lungs every 6 (six) hours as needed for wheezing or shortness of breath. 04/03/24  Yes [provider]  calcium  carbonate (TUMS - DOSED IN MG ELEMENTAL CALCIUM ) 500 MG chewable tablet Chew 2 tablets (400 mg of elemental calcium  total) by mouth 3 (three) times daily as needed for indigestion or heartburn. 03/01/24  Yes Theophilus Pagan, MD   carvedilol  (COREG ) 25 MG tablet Take 1 tablet (25 mg total) by mouth 2 (two) times daily with a meal. 12/08/23  Yes West, Katlyn D, NP  cyclobenzaprine  (FLEXERIL ) 10 MG tablet Take 1 tablet (10 mg total) by mouth 3 (three) times daily as needed for muscle spasms. 04/06/24  Yes Kammerer, Megan L, DO  diltiazem  (CARDIZEM  CD) 240 MG 24 hr capsule Take 1 capsule (240 mg total) by mouth 2 (two) times daily. 03/01/24  Yes Theophilus Pagan, MD  famotidine  (PEPCID ) 40 MG tablet Take 1 tablet (40 mg total) by mouth daily. Patient taking differently: Take 40 mg by mouth 2 (two) times daily. 10/17/23  Yes Henderly, Britni A, PA-C  feeding supplement (ENSURE PLUS HIGH PROTEIN) LIQD Take 237 mLs by mouth 3 (three) times daily between meals. 03/01/24  Yes Theophilus Pagan, MD  fluticasone  (FLONASE ) 50 MCG/ACT nasal spray Place 2 sprays into both nostrils daily. 04/03/24 04/03/25 Yes [provider]  folic acid  (FOLVITE ) 1 MG tablet Take 1 tablet (1 mg total) by mouth daily. 03/01/24  Yes Theophilus Pagan, MD  lipase/protease/amylase (CREON ) 12000-38000 units CPEP capsule Take 2 capsules (24,000 units of lipase total) by mouth Three (3) times a day with a meal. May also take  1 capsule (12,000 units of lipase total) with snacks (up to 3 snacks daily). 03/10/24  Yes [provider]  loperamide  (IMODIUM  A-D) 2 MG tablet Take 2 mg by mouth 4 (four) times daily as needed for diarrhea or loose stools.   Yes [provider]  magnesium  oxide (MAG-OX) 400 (240 Mg) MG tablet Take 1 tablet (400 mg total) by mouth daily. 11/07/23  Yes Hoy Fraction F, PA-C  Multiple Vitamin (MULTIVITAMIN WITH MINERALS) TABS tablet Take 1 tablet by mouth daily. 08/21/23  Yes Ghimire, Donalda HERO, MD  nicotine  (NICODERM CQ  - DOSED IN MG/24 HOURS) 21 mg/24hr patch Place 1 patch (21 mg total) onto the skin daily. Patient taking differently: Place 21 mg onto the skin daily as needed (for smoking cessation). 11/14/23  Yes Amin,  Ankit C, MD  nicotine  polacrilex (NICORETTE ) 2 MG gum Take 1 each (2 mg total) by mouth as needed for smoking cessation. 03/01/24  Yes Theophilus Pagan, MD  ondansetron  (ZOFRAN -ODT) 4 MG disintegrating tablet Take 1 tablet (4 mg total) by mouth every 8 (eight) hours as needed. 03/26/24  Yes Long, Fonda MATSU, MD  Oxycodone  HCl 10 MG TABS Take 10 mg by mouth 4 (four) times daily as needed. 04/03/24  Yes [provider]  pantoprazole  (PROTONIX ) 40 MG tablet Take 1 tablet (40 mg total) by mouth daily. 01/18/24  Yes Kehrli, Kelsey F, PA-C  polyethylene glycol powder (GLYCOLAX /MIRALAX ) 17 GM/SCOOP powder Take 17 g by mouth daily as needed for mild constipation. 11/13/23  Yes Amin, Ankit C, MD  promethazine  (PHENERGAN ) 25 MG tablet Take 1 tablet (25 mg total) by mouth every 8 (eight) hours as needed for up to 4 days for nausea or vomiting. 04/06/24 04/16/25 Yes Kammerer, Megan L, DO  sucralfate  (CARAFATE ) 1 GM/10ML suspension Take 1 g by mouth 4 (four) times daily -  with meals and at bedtime.   Yes [provider]  tamsulosin  (FLOMAX ) 0.4 MG CAPS capsule Take 1 capsule (0.4 mg total) by mouth daily. 03/01/24  Yes Theophilus Pagan, MD  thiamine  (VITAMIN B-1) 100 MG tablet Take 1 tablet (100 mg total) by mouth daily. 03/01/24  Yes Theophilus Pagan, MD  ferrous sulfate  325 (65 FE) MG tablet Take 1 tablet (325 mg total) by mouth daily with breakfast. Patient not taking: Reported on 04/16/2024 03/01/24   Theophilus Pagan, MD  naloxone  (NARCAN ) nasal spray 4 mg/0.1 mL Place 1 spray into the nose daily as needed (Opioid overdose). 03/01/24   Theophilus Pagan, MD  senna-docusate (SENOKOT-S) 8.6-50 MG tablet Take 1 tablet by mouth at bedtime as needed for mild constipation. Patient not taking: Reported on 04/16/2024 03/26/24   Darra Fonda MATSU, MD  amitriptyline  (ELAVIL ) 25 MG tablet Take 1 tablet (25 mg total) by mouth at bedtime. Patient not taking: Reported on 08/08/2019 10/15/18 08/08/19  Tobie Yetta HERO, MD     Scheduled Meds:  Continuous Infusions:  calcium  gluconate     diltiazem  (CARDIZEM ) infusion 10 mg/hr (04/16/24 1812)   heparin  850 Units/hr (04/16/24 2158)   PRN Meds:   Allergies:    Allergies  Allergen Reactions   Robaxin  [Methocarbamol ] Other (See Comments)    Myoclonus    Bayer Aspirin  [Aspirin ] Other (See Comments)    Unknown reaction   Trazodone  And Nefazodone Other (See Comments)    Muscle spasms   Adhesive [Tape] Itching   Latex Itching   Toradol  [Ketorolac  Tromethamine ] Other (See Comments)    Hx ulcers   Contrast Media [Iodinated Contrast Media]  Hives   Fish Allergy Nausea And Vomiting and Rash   Reglan  [Metoclopramide ] Other (See Comments)    Muscle spasms   Salmon [Fish Oil] Nausea And Vomiting and Rash   Shellfish Allergy Nausea And Vomiting and Rash    Social History:   Social History   Socioeconomic History   Marital status: Single    Spouse name: Not on file   Number of children: Not on file   Years of education: Not on file   Highest education level: Not on file  Occupational History   Occupation: disabled  Tobacco Use   Smoking status: Every Day    Current packs/day: 1.00    Average packs/day: 1 pack/day for 36.0 years (36.0 ttl pk-yrs)    Types: Cigarettes   Smokeless tobacco: Never  Vaping Use   Vaping status: Every Day  Substance and Sexual Activity   Alcohol  use: Yes    Comment: 01/07/24 4oz beer   Drug use: Yes    Types: Marijuana, Cocaine    Comment: Last used cocaine 01/04/24   Sexual activity: Yes    Birth control/protection: None  Other Topics Concern   Not on file  Social History Narrative   ** Merged History Encounter **       Social Drivers of Corporate Investment Banker Strain: Not on file  Food Insecurity: No Food Insecurity (03/15/2024)   [facility]   Hunger Vital Sign    Within the past 12 months, you worried that your food would run out before you got the money to buy more.: Never true     Within the past 12 months, the food you bought just didn't last and you didn't have money to get more.: Never true  Transportation Needs: No Transportation Needs (03/15/2024)   Received from [facility]   PRAPARE - Transportation    Lack of Transportation (Medical): No    Lack of Transportation (Non-Medical): No  Physical Activity: Not on file  Stress: Not on file  Social Connections: Socially Isolated (11/10/2023)   Social Connection and Isolation Panel    Frequency of Communication with Friends and Family: Three times a week    Frequency of Social Gatherings with Friends and Family: Once a week    Attends Religious Services: Never    Database Administrator or Organizations: No    Attends Banker Meetings: Never    Marital Status: Never married  Intimate Partner Violence: Not At Risk (02/21/2024)   Humiliation, Afraid, Rape, and Kick questionnaire    Fear of Current or Ex-Partner: No    Emotionally Abused: No    Physically Abused: No    Sexually Abused: No    Family History:    Family History  Problem Relation Age of Onset   Hypertension Mother    Cirrhosis Father    Alcoholism Father    Hypertension Father    Melanoma Father    Hypertension Other    Coronary artery disease Other      ROS:  Please see the history of present illness.   All other ROS reviewed and negative.     Physical Exam/Data: Vitals:   04/16/24 2130 04/16/24 2145 04/16/24 2245 04/16/24 2300  BP: 111/78 105/79    Pulse: (!) 124 (!) 117 88 (!) 104  Resp: 17 16 19 13   Temp:      TempSrc:      SpO2: 98% 100% 99% 99%  Weight:  Height:        Intake/Output Summary (Last 24 hours) at 04/16/2024 2331 Last data filed at 04/16/2024 1812 Gross per 24 hour  Intake 9.85 ml  Output --  Net 9.85 ml      04/16/2024    4:34 PM 04/02/2024    6:10 PM 03/26/2024   11:14 AM  Last 3 Weights  Weight (lbs) 118 lb 118 lb 118 lb  Weight (kg) 53.524 kg 53.524 kg 53.524 kg     Body mass  index is 17.94 kg/m.  General:  Well nourished, well developed, in no acute distress*** HEENT: normal Neck: no JVD Vascular: No carotid bruits; Distal pulses 2+ bilaterally Cardiac:  normal S1, S2; RRR; no murmur *** Lungs:  clear to auscultation bilaterally, no wheezing, rhonchi or rales  Abd: soft, nontender, no hepatomegaly  Ext: no edema Musculoskeletal:  No deformities, BUE and BLE strength normal and equal Skin: warm and dry  Neuro:  CNs 2-12 intact, no focal abnormalities noted Psych:  Normal affect   EKG:  The EKG was personally reviewed and demonstrates:  *** Telemetry:  Telemetry was personally reviewed and demonstrates:  ***  Relevant CV Studies: ***  Laboratory Data: High Sensitivity Troponin:   Recent Labs  Lab 04/02/24 1825 04/02/24 2034 04/06/24 1156 04/16/24 1643 04/16/24 1844  TROPONINIHS 6 7 5 6 7      Chemistry Recent Labs  Lab 04/16/24 1643  NA 138  K 4.0  CL 110  CO2 17*  GLUCOSE 188*  BUN 6  CREATININE 0.96  CALCIUM  7.3*  MG 1.1*  GFRNONAA >60  ANIONGAP 11    Recent Labs  Lab 04/16/24 1844  PROT 6.3*  ALBUMIN  3.0*  AST 37  ALT 23  ALKPHOS 69  BILITOT 0.3   Lipids No results for input(s): CHOL, TRIG, HDL, LABVLDL, LDLCALC, CHOLHDL in the last 168 hours.  Hematology Recent Labs  Lab 04/16/24 1643  WBC 9.3  RBC 3.75*  HGB 11.5*  HCT 35.6*  MCV 94.9  MCH 30.7  MCHC 32.3  RDW 18.8*  PLT 196   Thyroid   Recent Labs  Lab 04/16/24 1644  TSH 0.815    BNP Recent Labs  Lab 04/16/24 1644  BNP 90.7    DDimer No results for input(s): DDIMER in the last 168 hours.  Radiology/Studies:  CT CHEST ABDOMEN PELVIS WO CONTRAST Result Date: 04/16/2024 CLINICAL DATA:  Unintended weight loss, history of chest pain EXAM: CT CHEST, ABDOMEN AND PELVIS WITHOUT CONTRAST TECHNIQUE: Multidetector CT imaging of the chest, abdomen and pelvis was performed following the standard protocol without IV contrast. RADIATION DOSE  REDUCTION: This exam was performed according to the departmental dose-optimization program which includes automated exposure control, adjustment of the mA and/or kV according to patient size and/or use of iterative reconstruction technique. COMPARISON:  04/16/2024, 03/26/2024 FINDINGS: CT CHEST FINDINGS Cardiovascular: Unenhanced imaging the heart is unremarkable without pericardial effusion. Normal caliber of the thoracic aorta. Mediastinum/Nodes: No enlarged mediastinal, hilar, or axillary lymph nodes. Thyroid  gland, trachea, and esophagus demonstrate no significant findings. Lungs/Pleura: Biapical emphysematous changes. No acute airspace disease, effusion, or pneumothorax. Central airways are patent. Musculoskeletal: No acute or destructive bony abnormalities. Reconstructed images demonstrate no additional findings. CT ABDOMEN PELVIS FINDINGS Hepatobiliary: Unremarkable unenhanced appearance of the liver and gallbladder. Pancreas: Stable 4.4 x 4.2 cm pseudocyst within the pancreatic body. No acute inflammatory changes. Calcifications within the pancreatic parenchyma consistent with sequela of chronic calcific pancreatitis. Spleen: Unremarkable unenhanced appearance. Adrenals/Urinary Tract: No urinary tract calculi  or obstructive uropathy. Unremarkable appearance of the adrenals and bladder. Stomach/Bowel: No bowel obstruction or ileus. Normal appendix right mid abdomen. There is continued diffuse gastric wall thickening, unchanged since numerous previous exams. Please correlate with previous upper endoscopy results. Vascular/Lymphatic: Aortic atherosclerosis. No enlarged abdominal or pelvic lymph nodes. Reproductive: Prostate is unremarkable. Other: No free fluid or free intraperitoneal gas. No abdominal wall hernia. Musculoskeletal: No acute or destructive bony abnormalities. Reconstructed images demonstrate no additional findings. IMPRESSION: 1. Chronic diffuse gastric wall thickening, unchanged since numerous  previous exams. Please correlate with previous upper endoscopy results. 2. Sequela of chronic calcific pancreatitis, with stable 4.4 cm pseudocyst. No acute inflammatory changes. 3. No acute intra-abdominal scarred set no acute intrathoracic, intra-abdominal, or intrapelvic process. 4.  Aortic Atherosclerosis (ICD10-I70.0). 5. Pulmonary emphysema is present. Pulmonary emphysema is an independent risk factor for lung cancer. Recommend patient be considered for lung cancer screening. [place]  Chief Financial Officer. Screening for Lung Cancer: [place]  Licensed Conveyancer. JAMA. 2021;325(10):962-970. Electronically Signed   By: Ozell Daring M.D.   On: 04/16/2024 20:49   DG Chest Port 1 View Result Date: 04/16/2024 CLINICAL DATA:  Chest pain. EXAM: PORTABLE CHEST 1 VIEW COMPARISON:  04/02/2024. FINDINGS: The heart size and mediastinal contours are within normal limits. No focal consolidation, pleural effusion, or pneumothorax. No acute osseous abnormality. IMPRESSION: No acute cardiopulmonary findings. Electronically Signed   By: Harrietta Sherry M.D.   On: 04/16/2024 17:40     Assessment and Plan: ***   Risk Assessment/Risk Scores: {Complete the following score calculators/questions to meet required metrics.  Press F2         :[id number]}        CHA2DS2-VASc Score = 1  {Confirm score is correct.  If not, click here to update score.  REFRESH note.  :1} This indicates a 0.6% annual risk of stroke. The patient's score is based upon: CHF History: 0 HTN History: 1 Diabetes History: 0 Stroke History: 0 Vascular Disease History: 0 Age Score: 0 Gender Score: 0      {Are we signing off today?:[id number]}  For questions or updates, please contact [facility] Please consult [email or web address] for contact info under    {TIP  Split Shared Billing  Do NOT delete any part of this including brackets If split shared billing is based upon MDM, disregard If  billing will be based upon TIME you MUST document the number of minutes and a detailed list of what was done in that time in the following format Example - I spent ** minutes seeing this patient. During that time I reviewed their history, evaluated their symptoms, reviewed available labs, EKGs, studies, performed an exam and formulated an assessment and plan   :1} {Select this only if you need to document critical care time (Optional):[phone]} Signed, Greer Koeppen A Dnya Hickle, MD  04/16/2024 11:31 PM

## 2024-04-16 NOTE — H&P (Incomplete)
 [name] FMW:[id number] DOB: October 30, 1970 DOA: 04/16/2024     PCP: Myra Wanda LABOR, MD   Outpatient Specialists:   CARDS: Dr. Lynwood Schilling, MD  NEphrology: *  Dr. No care team member to display  NEurology *   Dr. Pulmonary *  Dr.  Oncology * Dr.No care team member to display  GI* Dr.  Gwen, LB) No care team member to display Urology Dr. *  Patient arrived to ER on 04/16/24 at 1630 Referred by Attending Melvenia Motto, MD   Patient coming from:    home Lives alone,   *** With family     Chief Complaint:   Chief Complaint  Patient presents with  . Irregular Heart Beat  . Shortness of Breath    HPI: [name] is a 53 y.o. male with medical history significant of chronic pancreatitis,, Wolf Parkinson White syndrome A-fib, peptic ulcer disease, asthma, seizure-like activity, alcohol  abuse, polysubstance abuse, anemia, hypertension, bipolar, tobacco abuse    Presented with  shortness of breath Patient was brought in by EMS with chest pain and shortness of breath initial heart rate was in 200s he was given 20 of diltiazem  which brought heart rate down to 170  Initial EKG worrisome for atrial fibrillation with RVR blood pressure 115/98 patient was not hypoxic.  Placed on 3 L of O2 for comfort he has known history of cocaine abuse and WPW as well as known history of atrial fibrillation He is supposed to be taking Coreg  and diltiazem  but ran out of his diltiazem  and has been taking his old metoprolol  instead Last time cocaine use was 5 days ago He continues to drink and last alcoholic drink was yesterday Reports the palpitations started last night and continued Today he also started having chest pain abdominal pain and worsening shortness of breath that became more severe with any exertion and not when he called EMS EKG confirmed A-fib he was started on diltiazem  drip titrated to 15 and heparin  was ordered    significant ETOH intake *** Does not  smoke*** but interested in quitting***  marijuana use ***    Regarding pertinent Chronic problems:    ****Hyperlipidemia - *on statins {statin:315258}  Lipid Panel     Component Value Date/Time   CHOL 175 10/30/2020 0545   TRIG 142 06/07/2021 0239   HDL 89 10/30/2020 0545   CHOLHDL 2.0 10/30/2020 0545   VLDL 8 10/30/2020 0545   LDLCALC 78 10/30/2020 0545    ***HTN on   chronic CHF diastolic - last echo*** Recent Results (from the past 56199 hours)  ECHOCARDIOGRAM COMPLETE   Collection Time: 11/10/23  4:45 PM  Result Value   Weight 1,963.2   Height 68   BP 203/112   S' Lateral 2.60   Area-P 1/2 5.02   Est EF 70 - 75%   Narrative      ECHOCARDIOGRAM REPORT       Patient Name:   [facility] Date of Exam: 11/10/2023 Medical Rec #:  [id number]              Height:       68.0 in Accession #:    [id number]             Weight:       122.7 lb Date of Birth:  1971-02-06               BSA:          1.661 m Patient  Age:    53 years               BP:           203/112 mmHg Patient Gender: M                      HR:           76 bpm. Exam Location:  Inpatient  Procedure: 2D Echo (Both Spectral and Color Flow Doppler were utilized during            procedure).  Indications:    syncope   History:        Patient has prior history of Echocardiogram examinations, most                 recent 02/16/2022. Arrythmias:WPW; Risk Factors:Hypertension,                 Current Smoker and polysubstance abuse.   Sonographer:    Tinnie Barefoot RDCS Referring Phys: [id number] MARSA NOVAK MELVIN  IMPRESSIONS    1. Left ventricular ejection fraction, by estimation, is 70 to 75%. The left ventricle has hyperdynamic function. The left ventricle has no regional wall motion abnormalities. Left ventricular diastolic parameters are consistent with Grade I diastolic  dysfunction (impaired relaxation).  2. Right ventricular systolic function is normal. The right ventricular size is normal.  There is mildly elevated pulmonary artery systolic pressure. The estimated right ventricular systolic pressure is 44.0 mmHg.  3. The mitral valve is normal in structure. No evidence of mitral valve regurgitation. No evidence of mitral stenosis.  4. The aortic valve has an indeterminant number of cusps. Aortic valve regurgitation is not visualized. No aortic stenosis is present.  5. The inferior vena cava is normal in size with <50% respiratory variability, suggesting right atrial pressure of 8 mmHg.  Comparison(s): No significant change from prior study.  FINDINGS  Left Ventricle: Left ventricular ejection fraction, by estimation, is 70 to 75%. The left ventricle has hyperdynamic function. The left ventricle has no regional wall motion abnormalities. Strain was performed and the global longitudinal strain is  indeterminate. The left ventricular internal cavity size was normal in size. There is no left ventricular hypertrophy. Left ventricular diastolic parameters are consistent with Grade I diastolic dysfunction (impaired relaxation).  Right Ventricle: The right ventricular size is normal. No increase in right ventricular wall thickness. Right ventricular systolic function is normal. There is mildly elevated pulmonary artery systolic pressure. The tricuspid regurgitant velocity is 3.00  m/s, and with an assumed right atrial pressure of 8 mmHg, the estimated right ventricular systolic pressure is 44.0 mmHg.  Left Atrium: Left atrial size was normal in size.  Right Atrium: Right atrial size was normal in size.  Pericardium: There is no evidence of pericardial effusion.  Mitral Valve: The mitral valve is normal in structure. No evidence of mitral valve regurgitation. No evidence of mitral valve stenosis.  Tricuspid Valve: The tricuspid valve is normal in structure. Tricuspid valve regurgitation is trivial. No evidence of tricuspid stenosis.  Aortic Valve: The aortic valve has an indeterminant  number of cusps. Aortic valve regurgitation is not visualized. No aortic stenosis is present.  Pulmonic Valve: The pulmonic valve was not well visualized. Pulmonic valve regurgitation is not visualized. No evidence of pulmonic stenosis.  Aorta: The aortic root and ascending aorta are structurally normal, with no evidence of dilitation.  Venous: The inferior vena cava is normal in size with less than  50% respiratory variability, suggesting right atrial pressure of 8 mmHg.  IAS/Shunts: No atrial level shunt detected by color flow Doppler.  Additional Comments: 3D was performed not requiring image post processing on an independent workstation and was indeterminate.    LEFT VENTRICLE PLAX 2D LVIDd:         4.40 cm   Diastology LVIDs:         2.60 cm   LV e' medial:    11.00 cm/s LV PW:         0.80 cm   LV E/e' medial:  11.1 LV IVS:        0.60 cm   LV e' lateral:   12.60 cm/s LVOT diam:     1.90 cm   LV E/e' lateral: 9.7 LV SV:         58 LV SV Index:   35 LVOT Area:     2.84 cm    RIGHT VENTRICLE             IVC RV Basal diam:  2.50 cm     IVC diam: 2.00 cm RV S prime:     14.80 cm/s TAPSE (M-mode): 1.8 cm  LEFT ATRIUM             Index        RIGHT ATRIUM          Index LA diam:        3.00 cm 1.81 cm/m   RA Area:     9.63 cm LA Vol (A2C):   37.3 ml 22.46 ml/m  RA Volume:   19.90 ml 11.98 ml/m LA Vol (A4C):   31.1 ml 18.73 ml/m LA Biplane Vol: 34.4 ml 20.72 ml/m  AORTIC VALVE LVOT Vmax:   117.00 cm/s LVOT Vmean:  77.300 cm/s LVOT VTI:    0.206 m   AORTA Ao Root diam: 2.70 cm Ao Asc diam:  2.60 cm  MITRAL VALVE                TRICUSPID VALVE MV Area (PHT): 5.02 cm     TR Peak grad:   36.0 mmHg MV Decel Time: 151 msec     TR Vmax:        300.00 cm/s MV E velocity: 122.00 cm/s MV A velocity: 96.30 cm/s   SHUNTS MV E/A ratio:  1.27         Systemic VTI:  0.21 m                             Systemic Diam: 1.90 cm  Vishnu Priya Mallipeddi Electronically signed by  Diannah Late Mallipeddi Signature Date/Time: 11/10/2023/4:55:23 PM       Final     *Note: Due to a large number of results and/or encounters for the requested time period, some results have not been displayed. A complete set of results can be found in Results Review.    *** CAD  - On Aspirin , statin, betablocker, Plavix                 - *followed by cardiology                - last cardiac cath       ***DM 2 -  Lab Results  Component Value Date   HGBA1C 4.8 08/14/2023   ****on insulin , PO meds only, diet controlled  ***Hypothyroidism:   Lab Results  Component Value Date  TSH 0.815 04/16/2024   on synthroid  *** Morbid obesity-   BMI Readings from Last 1 Encounters:  04/16/24 17.94 kg/m     *** Asthma -well *** controlled on home inhalers/ nebs                     *** COPD - not **followed by pulmonology *** not  on baseline oxygen   *L,    *** OSA -on nocturnal oxygen , *CPAP, *noncompliant with CPAP  *** Hx of CVA - *with/out residual deficits on Aspirin  81 mg, 325, Plavix  ***A. Fib -   atrial fibrillation CHA2DS2 vas score **** CHA2DS2/VAS Stroke Risk Points  Current as of 10 minutes ago     1 >= 2 Points: High Risk  1 to 1.99 Points: Medium Risk  0 Points: Low Risk    Last Change: N/A      Details    This score determines the patient's risk of having a stroke if the  patient has atrial fibrillation.       Points Metrics  0 Has Congestive Heart Failure:  No    Current as of 10 minutes ago  0 Has Vascular Disease:  No    Current as of 10 minutes ago  1 Has Hypertension:  Yes    Current as of 10 minutes ago  0 Age:  66    Current as of 10 minutes ago  0 Has Diabetes Excluding Gestational Diabetes:  No    Current as of 10 minutes ago  0 Had Stroke:  No  Had TIA:  No  Had Thromboembolism:  No    Current as of 10 minutes ago  0 Male:  No    Current as of 10 minutes ago           current  on anticoagulation with ****Coumadin  ***Xarelto ,* Eliquis,   *** Not on anticoagulation secondary to Risk of Falls, *** recurrent bleeding         -  Rate control:  Currently controlled with ***Toprolol,  *Metoprolol ,* Diltiazem , *Coreg           - Rhythm control: *** amiodarone , *flecainide  ***Hx of DVT/PE on - anticoagulation with ****Coumadin  ***Xarelto ,* Eliquis,     ***CKD stage III*-   baseline Cr **** Estimated Creatinine Clearance: 66.6 mL/min (by C-G formula based on SCr of 0.96 mg/dL).  Lab Results  Component Value Date   CREATININE 0.96 04/16/2024   CREATININE 0.92 04/06/2024   CREATININE 0.90 04/02/2024   Lab Results  Component Value Date   NA 138 04/16/2024   CL 110 04/16/2024   K 4.0 04/16/2024   CO2 17 (L) 04/16/2024   BUN 6 04/16/2024   CREATININE 0.96 04/16/2024   GFRNONAA >60 04/16/2024   CALCIUM  7.3 (L) 04/16/2024   PHOS 3.9 11/12/2023   ALBUMIN  3.0 (L) 04/16/2024   GLUCOSE 188 (H) 04/16/2024    **** Liver disease MELD 3.0: 6 at 10/13/2023  6:13 AM MELD-Na: 6 at 10/13/2023  6:13 AM Calculated from: Serum Creatinine: 0.97 mg/dL (Using min of 1 mg/dL) at 07/09/7972  5:98 AM Serum Sodium: 138 mmol/L (Using max of 137 mmol/L) at 10/13/2023  4:01 AM Total Bilirubin: 0.7 mg/dL (Using min of 1 mg/dL) at 07/09/7972  3:86 AM Serum Albumin : 4 g/dL (Using max of 3.5 g/dL) at 07/09/7972  3:86 AM INR(ratio): 0.9 (Using min of 1) at 10/13/2023  4:01 AM Age at listing (hypothetical): 53 years Sex: Male at 10/13/2023  6:13  AM  Hepatic Function Panel     Component Value Date/Time   PROT 6.3 (L) 04/16/2024 1844   PROT 7.5 05/22/2020 1451   ALBUMIN  3.0 (L) 04/16/2024 1844   ALBUMIN  4.7 05/22/2020 1451   AST 37 04/16/2024 1844   ALT 23 04/16/2024 1844   ALKPHOS 69 04/16/2024 1844   BILITOT 0.3 04/16/2024 1844   BILITOT 0.3 05/22/2020 1451   BILIDIR 0.2 04/16/2024 1844   IBILI 0.1 (L) 04/16/2024 1844   0.9  ***BPH - on Flomax , Proscar    *** Dementia - on Aricept** Nemenda  *** Chronic anemia - baseline hg Hemoglobin &  Hematocrit  Recent Labs    04/02/24 1825 04/06/24 1156 04/16/24 1643  HGB 11.3* 10.2* 11.5*   Iron/TIBC/Ferritin/ %Sat    Component Value Date/Time   IRON 28 (L) 02/26/2024 1215   TIBC 273 02/26/2024 1215   FERRITIN 35 02/26/2024 1215   IRONPCTSAT 10 (L) 02/26/2024 1215     Seizure DO - las seizure *** currently on     Cancer:     While in ER:         Lab Orders         Basic metabolic panel         Magnesium          CBC         TSH         Brain natriuretic peptide (IF shortness of breath has been documented this visit)         Ethanol         Rapid urine drug screen (hospital performed)         Urinalysis, Routine w reflex microscopic -Urine, Clean Catch         Hepatic function panel         Lipase, blood         Heparin  level (unfractionated)         Heparin  level (unfractionated)         CBC      CT HEAD *** NON acute   MRI brain  ***no acute CVA  CXR - ***NON acute  CTabd/pelvis - ***nonacute  CTA chest - ***nonacute, no PE, * no evidence of infiltrate  Following Medications were ordered in ER: Medications  diltiazem  (CARDIZEM ) 125 mg in dextrose  5% 125 mL (1 mg/mL) infusion (10 mg/hr Intravenous Infusion Verify 04/16/24 1812)  heparin  ADULT infusion 100 units/mL (25000 units/250mL) (850 Units/hr Intravenous New Bag/Given 04/16/24 2158)  fentaNYL  (SUBLIMAZE ) injection 100 mcg (100 mcg Intravenous Given 04/16/24 1652)  LORazepam  (ATIVAN ) injection 1 mg (1 mg Intravenous Given 04/16/24 1652)  alum & mag hydroxide-simeth (MAALOX/MYLANTA) 200-200-20 MG/5ML suspension 30 mL (30 mLs Oral Given 04/16/24 1737)    And  lidocaine  (XYLOCAINE ) 2 % viscous mouth solution 15 mL (15 mLs Oral Given 04/16/24 1737)  HYDROmorphone  (DILAUDID ) injection 0.5 mg (0.5 mg Intravenous Given 04/16/24 1736)  magnesium  sulfate IVPB 2 g 50 mL (0 g Intravenous Stopped 04/16/24 1922)  HYDROmorphone  (DILAUDID ) injection 0.5 mg (0.5 mg Intravenous Given 04/16/24 1901)  ondansetron   (ZOFRAN ) injection 4 mg (4 mg Intravenous Given 04/16/24 2053)  HYDROmorphone  (DILAUDID ) injection 0.5 mg (0.5 mg Intravenous Given 04/16/24 2245)    _______________________________________________________ ER Provider Called:       DrGOMEZ  They Recommend admit to medicine *** Will see in AM  ***SEEN in ER     ED Triage Vitals  Encounter Vitals Group     BP 04/16/24 1636 ROLLEN)  136/124     Girls Systolic BP Percentile --      Girls Diastolic BP Percentile --      Boys Systolic BP Percentile --      Boys Diastolic BP Percentile --      Pulse Rate 04/16/24 1636 81     Resp 04/16/24 1636 (!) 24     Temp 04/16/24 1636 98 F (36.7 C)     Temp Source 04/16/24 1636 Oral     SpO2 04/16/24 1636 100 %     Weight 04/16/24 1634 118 lb (53.5 kg)     Height 04/16/24 1634 5' 8 (1.727 m)     Head Circumference --      Peak Flow --      Pain Score 04/16/24 1633 6     Pain Loc --      Pain Education --      Exclude from Growth Chart --   UFJK(75)@     _________________________________________ Significant initial  Findings: Abnormal Labs Reviewed  BASIC METABOLIC PANEL WITH GFR - Abnormal; Notable for the following components:      Result Value   CO2 17 (*)    Glucose, Bld 188 (*)    Calcium  7.3 (*)    All other components within normal limits  MAGNESIUM  - Abnormal; Notable for the following components:   Magnesium  1.1 (*)    All other components within normal limits  CBC - Abnormal; Notable for the following components:   RBC 3.75 (*)    Hemoglobin 11.5 (*)    HCT 35.6 (*)    RDW 18.8 (*)    All other components within normal limits  RAPID URINE DRUG SCREEN, HOSP PERFORMED - Abnormal; Notable for the following components:   Cocaine POSITIVE (*)    Tetrahydrocannabinol POSITIVE (*)    All other components within normal limits  URINALYSIS, ROUTINE W REFLEX MICROSCOPIC - Abnormal; Notable for the following components:   Color, Urine STRAW (*)    Specific Gravity, Urine 1.004 (*)    All  other components within normal limits  HEPATIC FUNCTION PANEL - Abnormal; Notable for the following components:   Total Protein 6.3 (*)    Albumin  3.0 (*)    Indirect Bilirubin 0.1 (*)    All other components within normal limits      _________________________ Troponin ***ordered Cardiac Panel (last 3 results) Recent Labs    04/16/24 1643 04/16/24 1844  TROPONINIHS 6 7     ECG: Ordered Personally reviewed and interpreted by me showing: HR : *** Rhythm: *NSR, Sinus tachycardia * A.fib. W RVR, RBBB, LBBB, Paced Ischemic changes*nonspecific changes, no evidence of ischemic changes QTC*  BNP (last 3 results) Recent Labs    08/13/23 1001 02/20/24 1431 04/16/24 1644  BNP 423.9* 100.3* 90.7      No results for input(s): DDIMER, FERRITIN, LDH, CRP in the last 72 hours.    ____________________ This patient meets SIRS Criteria and may be septic. SIRS = Systemic Inflammatory Response Syndrome  Order a lactic acid level if needed AND/OR Initiate the sepsis protocol with the attached order set OR Click Treating Associated Infection or Illness if the patient is being treated for an infection that is a known cause of these abnormalities     The recent clinical data is shown below. Vitals:   04/16/24 2130 04/16/24 2145 04/16/24 2245 04/16/24 2300  BP: 111/78 105/79    Pulse: (!) 124 (!) 117 88 (!) 104  Resp: 17 16 19  13  Temp:      TempSrc:      SpO2: 98% 100% 99% 99%  Weight:      Height:            WBC     Component Value Date/Time   WBC 9.3 04/16/2024 1643   LYMPHSABS 1.9 04/06/2024 1156   LYMPHSABS 2.4 05/22/2020 1451   MONOABS 1.0 04/06/2024 1156   EOSABS 0.1 04/06/2024 1156   EOSABS 0.1 05/22/2020 1451   BASOSABS 0.0 04/06/2024 1156   BASOSABS 0.0 05/22/2020 1451        Lactic Acid, Venous    Component Value Date/Time   LATICACIDVEN 1.0 08/11/2023 1546     Lactic Acid, Venous    Component Value Date/Time   LATICACIDVEN 1.0  08/11/2023 1546    Procalcitonin *** Ordered      UA *** no evidence of UTI  ***Pending ***not ordered   Urine analysis:    Component Value Date/Time   COLORURINE STRAW (A) 04/16/2024 1644   APPEARANCEUR CLEAR 04/16/2024 1644   LABSPEC 1.004 (L) 04/16/2024 1644   PHURINE 6.0 04/16/2024 1644   GLUCOSEU NEGATIVE 04/16/2024 1644   HGBUR NEGATIVE 04/16/2024 1644   HGBUR negative 04/30/2010 1020   BILIRUBINUR NEGATIVE 04/16/2024 1644   BILIRUBINUR negative 09/06/2019 1649   KETONESUR NEGATIVE 04/16/2024 1644   PROTEINUR NEGATIVE 04/16/2024 1644   UROBILINOGEN 0.2 09/06/2019 1649   UROBILINOGEN 0.2 03/15/2015 0508   NITRITE NEGATIVE 04/16/2024 1644   LEUKOCYTESUR NEGATIVE 04/16/2024 1644    Results for orders placed or performed during the hospital encounter of 12/03/23  MRSA Next Gen by PCR, Nasal     Status: None   Collection Time: 12/03/23  5:21 PM   Specimen: Nasal Mucosa; Nasal Swab  Result Value Ref Range Status   MRSA by PCR Next Gen NOT DETECTED NOT DETECTED Final    Comment: (NOTE) The GeneXpert MRSA Assay (FDA approved for NASAL specimens only), is one component of a comprehensive MRSA colonization surveillance program. It is not intended to diagnose MRSA infection nor to guide or monitor treatment for MRSA infections. Test performance is not FDA approved in patients less than 9 years old. Performed at [facility], [address] [place], [place] [address]    *Note: Due to a large number of results and/or encounters for the requested time period, some results have not been displayed. A complete set of results can be found in Results Review.    ABX started Antibiotics Given (last 72 hours)     None        No results found for the last 90 days.    ________________________________________________________________  Arterial ***Venous  Blood Gas result:  pH *** pCO2 ***; pO2 ***;     %O2 Sat ***.  ABG    Component Value Date/Time   PHART 7.33  (L) 08/11/2023 1537   PCO2ART 24 (L) 08/11/2023 1537   PO2ART 72 (L) 08/11/2023 1537   HCO3 12.7 (L) 08/11/2023 1537   TCO2 13 (L) 02/20/2024 1436   ACIDBASEDEF 11.3 (H) 08/11/2023 1537   O2SAT 96.8 08/11/2023 1537       __________________________________________________________ Recent Labs  Lab 04/16/24 1643  NA 138  K 4.0  CO2 17*  GLUCOSE 188*  BUN 6  CREATININE 0.96  CALCIUM  7.3*  MG 1.1*    Cr  * stable,  Up from baseline see below Lab Results  Component Value Date   CREATININE 0.96 04/16/2024   CREATININE 0.92  04/06/2024   CREATININE 0.90 04/02/2024    Recent Labs  Lab 04/16/24 1844  AST 37  ALT 23  ALKPHOS 69  BILITOT 0.3  PROT 6.3*  ALBUMIN  3.0*   Lab Results  Component Value Date   CALCIUM  7.3 (L) 04/16/2024   PHOS 3.9 11/12/2023          Plt: Lab Results  Component Value Date   PLT 196 04/16/2024         Recent Labs  Lab 04/16/24 1643  WBC 9.3  HGB 11.5*  HCT 35.6*  MCV 94.9  PLT 196    HG/HCT * stable,  Down *Up from baseline see below    Component Value Date/Time   HGB 11.5 (L) 04/16/2024 1643   HGB 11.9 (L) 05/22/2020 1451   HCT 35.6 (L) 04/16/2024 1643   HCT 36.5 (L) 05/22/2020 1451   MCV 94.9 04/16/2024 1643   MCV 100 (H) 05/22/2020 1451      Recent Labs  Lab 04/16/24 1844  LIPASE 21   No results for input(s): AMMONIA in the last 168 hours.    _______________________________________________ Hospitalist was called for admission for *** Atrial fibrillation with RVR (HCC) ***    The following Work up has been ordered so far:  Orders Placed This Encounter  Procedures  . DG Chest Port 1 View  . CT CHEST ABDOMEN PELVIS WO CONTRAST  . Basic metabolic panel  . Magnesium   . CBC  . TSH  . Brain natriuretic peptide (IF shortness of breath has been documented this visit)  . Ethanol  . Rapid urine drug screen (hospital performed)  . Urinalysis, Routine w reflex microscopic -Urine, Clean Catch  . Hepatic  function panel  . Lipase, blood  . Heparin  level (unfractionated)  . Heparin  level (unfractionated)  . CBC  . Diet NPO time specified  . ED Cardiac monitoring  . Initiate Carrier Fluid Protocol  . Consult for [facility] Admission  . heparin  per pharmacy consult  . Oxygen  therapy Mode or (Route): Other Specify; Comments: Old Agency or Simple Mask - to keep O2 sats >92%  . ED Pulse oximetry, continuous  . EKG 12-Lead  . ED EKG  . EKG 12-Lead  . EKG 12-Lead  . EKG 12-Lead  . Insert peripheral IV     OTHER Significant initial  Findings:  labs showing:     DM  labs:  HbA1C: Recent Labs    08/14/23 0935  HGBA1C 4.8       CBG (last 3)  No results for input(s): GLUCAP in the last 72 hours.        Cultures:    Component Value Date/Time   SDES URINE, CLEAN CATCH 11/20/2020 1221   SPECREQUEST NONE 11/20/2020 1221   CULT  11/20/2020 1221    NO GROWTH Performed at [facility], [address], [place], [place] [address]    REPTSTATUS 11/21/2020 FINAL 11/20/2020 1221     Radiological Exams on Admission: CT CHEST ABDOMEN PELVIS WO CONTRAST Result Date: 04/16/2024 CLINICAL DATA:  Unintended weight loss, history of chest pain EXAM: CT CHEST, ABDOMEN AND PELVIS WITHOUT CONTRAST TECHNIQUE: Multidetector CT imaging of the chest, abdomen and pelvis was performed following the standard protocol without IV contrast. RADIATION DOSE REDUCTION: This exam was performed according to the departmental dose-optimization program which includes automated exposure control, adjustment of the mA and/or kV according to patient size and/or use of iterative reconstruction technique. COMPARISON:  04/16/2024, 03/26/2024 FINDINGS: CT CHEST FINDINGS Cardiovascular:  Unenhanced imaging the heart is unremarkable without pericardial effusion. Normal caliber of the thoracic aorta. Mediastinum/Nodes: No enlarged mediastinal, hilar, or axillary lymph nodes. Thyroid  gland, trachea, and esophagus demonstrate  no significant findings. Lungs/Pleura: Biapical emphysematous changes. No acute airspace disease, effusion, or pneumothorax. Central airways are patent. Musculoskeletal: No acute or destructive bony abnormalities. Reconstructed images demonstrate no additional findings. CT ABDOMEN PELVIS FINDINGS Hepatobiliary: Unremarkable unenhanced appearance of the liver and gallbladder. Pancreas: Stable 4.4 x 4.2 cm pseudocyst within the pancreatic body. No acute inflammatory changes. Calcifications within the pancreatic parenchyma consistent with sequela of chronic calcific pancreatitis. Spleen: Unremarkable unenhanced appearance. Adrenals/Urinary Tract: No urinary tract calculi or obstructive uropathy. Unremarkable appearance of the adrenals and bladder. Stomach/Bowel: No bowel obstruction or ileus. Normal appendix right mid abdomen. There is continued diffuse gastric wall thickening, unchanged since numerous previous exams. Please correlate with previous upper endoscopy results. Vascular/Lymphatic: Aortic atherosclerosis. No enlarged abdominal or pelvic lymph nodes. Reproductive: Prostate is unremarkable. Other: No free fluid or free intraperitoneal gas. No abdominal wall hernia. Musculoskeletal: No acute or destructive bony abnormalities. Reconstructed images demonstrate no additional findings. IMPRESSION: 1. Chronic diffuse gastric wall thickening, unchanged since numerous previous exams. Please correlate with previous upper endoscopy results. 2. Sequela of chronic calcific pancreatitis, with stable 4.4 cm pseudocyst. No acute inflammatory changes. 3. No acute intra-abdominal scarred set no acute intrathoracic, intra-abdominal, or intrapelvic process. 4.  Aortic Atherosclerosis (ICD10-I70.0). 5. Pulmonary emphysema is present. Pulmonary emphysema is an independent risk factor for lung cancer. Recommend patient be considered for lung cancer screening. [place]  Chief Financial Officer. Screening for Lung Cancer: [place]   Licensed Conveyancer. JAMA. 2021;325(10):[id number]. Electronically Signed   By: Ozell Daring M.D.   On: 04/16/2024 20:49   DG Chest Port 1 View Result Date: 04/16/2024 CLINICAL DATA:  Chest pain. EXAM: PORTABLE CHEST 1 VIEW COMPARISON:  04/02/2024. FINDINGS: The heart size and mediastinal contours are within normal limits. No focal consolidation, pleural effusion, or pneumothorax. No acute osseous abnormality. IMPRESSION: No acute cardiopulmonary findings. Electronically Signed   By: Harrietta Sherry M.D.   On: 04/16/2024 17:40   _______________________________________________________________________________________________________ Latest  Blood pressure 105/79, pulse (!) 104, temperature (!) 97.2 F (36.2 C), temperature source Oral, resp. rate 13, height 5' 8 (1.727 m), weight 53.5 kg, SpO2 99%.   Vitals  labs and radiology finding personally reviewed  Review of Systems:    Pertinent positives include: ***  Constitutional:  No weight loss, night sweats, Fevers, chills, fatigue, weight loss  HEENT:  No headaches, Difficulty swallowing,Tooth/dental problems,Sore throat,  No sneezing, itching, ear ache, nasal congestion, post nasal drip,  Cardio-vascular:  No chest pain, Orthopnea, PND, anasarca, dizziness, palpitations.no Bilateral lower extremity swelling  GI:  No heartburn, indigestion, abdominal pain, nausea, vomiting, diarrhea, change in bowel habits, loss of appetite, melena, blood in stool, hematemesis Resp:  no shortness of breath at rest. No dyspnea on exertion, No excess mucus, no productive cough, No non-productive cough, No coughing up of blood.No change in color of mucus.No wheezing. Skin:  no rash or lesions. No jaundice GU:  no dysuria, change in color of urine, no urgency or frequency. No straining to urinate.  No flank pain.  Musculoskeletal:  No joint pain or no joint swelling. No decreased range of motion. No back pain.  Psych:   No change in mood or affect. No depression or anxiety. No memory loss.  Neuro: no localizing neurological complaints, no tingling, no weakness, no double vision, no gait abnormality,  no slurred speech, no confusion  All systems reviewed and apart from HOPI all are negative _______________________________________________________________________________________________ Past Medical History:   Past Medical History:  Diagnosis Date  . Alcoholism /alcohol  abuse   . Anemia   . Anxiety   . Arthritis    knees; arms; elbows (03/26/2015)  . Asthma   . Bipolar disorder (HCC)   . Chronic bronchitis (HCC)   . Chronic lower back pain   . Chronic pancreatitis (HCC)   . Cocaine abuse (HCC)   . Depression   . Family history of adverse reaction to anesthesia   . Femoral condyle fracture (HCC) 03/08/2014   left medial/notes 03/09/2014  . GERD (gastroesophageal reflux disease)   . H/O hiatal hernia   . H/O suicide attempt 10/2012  . High cholesterol   . History of blood transfusion 10/2012   when I tried to commit suicide  . History of stomach ulcers   . Hypertension   . Marijuana abuse, continuous   . Migraine    a few times/year (03/26/2015)  . Pancreatitis   . Pneumonia 1990's X 3  . PTSD (post-traumatic stress disorder)   . Seizures (HCC)   . Sickle cell trait   . WPW (Wolff-Parkinson-White syndrome)    thelbert 03/06/2013      Past Surgical History:  Procedure Laterality Date  . BIOPSY  11/25/2017   Procedure: BIOPSY;  Surgeon: Burnette Fallow, MD;  Location: [facility] ENDOSCOPY;  Service: Endoscopy;;  . BIOPSY  10/14/2018   Procedure: BIOPSY;  Surgeon: Burnette Fallow, MD;  Location: [facility] ENDOSCOPY;  Service: Endoscopy;;  . BIOPSY  03/06/2023   Procedure: BIOPSY;  Surgeon: Wilhelmenia Aloha Raddle., MD;  Location: [facility] ENDOSCOPY;  Service: Gastroenterology;;  . CARDIAC CATHETERIZATION    . CYST ENTEROSTOMY  01/02/2020   Procedure: CYST ASPIRATION;  Surgeon: Teressa Toribio SQUIBB, MD;  Location: [facility]  ENDOSCOPY;  Service: Endoscopy;;  . ESOPHAGOGASTRODUODENOSCOPY N/A 03/06/2023   Procedure: ESOPHAGOGASTRODUODENOSCOPY (EGD);  Surgeon: Wilhelmenia Aloha Raddle., MD;  Location: [facility] ENDOSCOPY;  Service: Gastroenterology;  Laterality: N/A;  . ESOPHAGOGASTRODUODENOSCOPY (EGD) WITH PROPOFOL  N/A 11/25/2017   Procedure: ESOPHAGOGASTRODUODENOSCOPY (EGD) WITH PROPOFOL ;  Surgeon: Burnette Fallow, MD;  Location: [facility] ENDOSCOPY;  Service: Endoscopy;  Laterality: N/A;  . ESOPHAGOGASTRODUODENOSCOPY (EGD) WITH PROPOFOL  Left 10/14/2018   Procedure: ESOPHAGOGASTRODUODENOSCOPY (EGD) WITH PROPOFOL ;  Surgeon: Burnette Fallow, MD;  Location: [facility] ENDOSCOPY;  Service: Endoscopy;  Laterality: Left;  . ESOPHAGOGASTRODUODENOSCOPY (EGD) WITH PROPOFOL  N/A 11/14/2018   Procedure: ESOPHAGOGASTRODUODENOSCOPY (EGD) WITH PROPOFOL ;  Surgeon: Celestia Agent, MD;  Location: [facility] ENDOSCOPY;  Service: Gastroenterology;  Laterality: N/A;  . ESOPHAGOGASTRODUODENOSCOPY (EGD) WITH PROPOFOL  N/A 01/02/2020   Procedure: ESOPHAGOGASTRODUODENOSCOPY (EGD) WITH PROPOFOL ;  Surgeon: Teressa Toribio SQUIBB, MD;  Location: [facility] ENDOSCOPY;  Service: Endoscopy;  Laterality: N/A;  . ESOPHAGOGASTRODUODENOSCOPY (EGD) WITH PROPOFOL  N/A 10/25/2020   Procedure: ESOPHAGOGASTRODUODENOSCOPY (EGD) WITH PROPOFOL ;  Surgeon: Wilhelmenia Aloha Raddle., MD;  Location: [facility] ENDOSCOPY;  Service: Gastroenterology;  Laterality: N/A;  . EUS N/A 01/02/2020   Procedure: UPPER ENDOSCOPIC ULTRASOUND (EUS) RADIAL;  Surgeon: Teressa Toribio SQUIBB, MD;  Location: [facility] ENDOSCOPY;  Service: Endoscopy;  Laterality: N/A;  . EUS N/A 03/06/2023   Procedure: UPPER ENDOSCOPIC ULTRASOUND (EUS) RADIAL;  Surgeon: Wilhelmenia Aloha Raddle., MD;  Location: [facility] ENDOSCOPY;  Service: Gastroenterology;  Laterality: N/A;  . EYE SURGERY Left 1990's   result of trauma   . FACIAL FRACTURE SURGERY Left 1990's   result of trauma   . FINE NEEDLE ASPIRATION N/A 03/06/2023   Procedure: FINE NEEDLE ASPIRATION (FNA) LINEAR;  Surgeon:  Wilhelmenia Aloha Raddle., MD;  Location: [facility] ENDOSCOPY;  Service: Gastroenterology;  Laterality: N/A;  . FLEXIBLE SIGMOIDOSCOPY N/A 10/25/2020   Procedure: FLEXIBLE SIGMOIDOSCOPY;  Surgeon: Wilhelmenia Aloha Raddle., MD;  Location: [facility] ENDOSCOPY;  Service: Gastroenterology;  Laterality: N/A;  . FRACTURE SURGERY    . HEMOSTASIS CLIP PLACEMENT  10/25/2020   Procedure: HEMOSTASIS CLIP PLACEMENT;  Surgeon: Wilhelmenia Aloha Raddle., MD;  Location: [facility] ENDOSCOPY;  Service: Gastroenterology;;  . HERNIA REPAIR    . HOT HEMOSTASIS N/A 03/06/2023   Procedure: HOT HEMOSTASIS (ARGON PLASMA COAGULATION/BICAP);  Surgeon: Wilhelmenia Aloha Raddle., MD;  Location: [facility] ENDOSCOPY;  Service: Gastroenterology;  Laterality: N/A;  . LEFT HEART CATHETERIZATION WITH CORONARY ANGIOGRAM Right 03/07/2013   Procedure: LEFT HEART CATHETERIZATION WITH CORONARY ANGIOGRAM;  Surgeon: Salena GORMAN Negri, MD;  Location: [facility] CATH LAB;  Service: Cardiovascular;  Laterality: Right;  . UMBILICAL HERNIA REPAIR    . UPPER GASTROINTESTINAL ENDOSCOPY      Social History:  Ambulatory *** independently cane, walker  wheelchair bound, bed bound     reports that he has been smoking cigarettes. He has a 36 pack-year smoking history. He has never used smokeless tobacco. He reports current alcohol  use. He reports current drug use. Drugs: Marijuana and Cocaine.     Family History: *** Family History  Problem Relation Age of Onset  . Hypertension Mother   . Cirrhosis Father   . Alcoholism Father   . Hypertension Father   . Melanoma Father   . Hypertension Other   . Coronary artery disease Other    ______________________________________________________________________________________________ Allergies: Allergies  Allergen Reactions  . Robaxin  [Methocarbamol ] Other (See Comments)    Myoclonus   . Bayer Aspirin  [Aspirin ] Other (See Comments)    Unknown reaction  . Trazodone  And Nefazodone Other (See Comments)    Muscle spasms  . Adhesive  [Tape] Itching  . Latex Itching  . Toradol  [Ketorolac  Tromethamine ] Other (See Comments)    Hx ulcers  . Contrast Media [Iodinated Contrast Media] Hives  . Fish Allergy Nausea And Vomiting and Rash  . Reglan  [Metoclopramide ] Other (See Comments)    Muscle spasms  . Salmon [Fish Oil] Nausea And Vomiting and Rash  . Shellfish Allergy Nausea And Vomiting and Rash     Prior to Admission medications   Medication Sig Start Date End Date Taking? Authorizing Provider  acetaminophen  (TYLENOL ) 500 MG tablet Take 1,000 mg by mouth daily as needed for mild pain (pain score 1-3) or moderate pain (pain score 4-6).   Yes [provider]  albuterol  (VENTOLIN  HFA) 108 (90 Base) MCG/ACT inhaler Inhale 2 puffs into the lungs every 6 (six) hours as needed for wheezing or shortness of breath. 04/03/24  Yes [provider]  calcium  carbonate (TUMS - DOSED IN MG ELEMENTAL CALCIUM ) 500 MG chewable tablet Chew 2 tablets (400 mg of elemental calcium  total) by mouth 3 (three) times daily as needed for indigestion or heartburn. 03/01/24  Yes Theophilus Pagan, MD  carvedilol  (COREG ) 25 MG tablet Take 1 tablet (25 mg total) by mouth 2 (two) times daily with a meal. 12/08/23  Yes West, Katlyn D, NP  cyclobenzaprine  (FLEXERIL ) 10 MG tablet Take 1 tablet (10 mg total) by mouth 3 (three) times daily as needed for muscle spasms. 04/06/24  Yes Kammerer, Megan L, DO  diltiazem  (CARDIZEM  CD) 240 MG 24 hr capsule Take 1 capsule (240 mg total) by mouth 2 (two) times daily. 03/01/24  Yes Theophilus Pagan, MD  famotidine  (PEPCID ) 40 MG tablet  Take 1 tablet (40 mg total) by mouth daily. Patient taking differently: Take 40 mg by mouth 2 (two) times daily. 10/17/23  Yes Henderly, Britni A, PA-C  feeding supplement (ENSURE PLUS HIGH PROTEIN) LIQD Take 237 mLs by mouth 3 (three) times daily between meals. 03/01/24  Yes Theophilus Pagan, MD  fluticasone  (FLONASE ) 50 MCG/ACT nasal spray Place 2 sprays into both nostrils  daily. 04/03/24 04/03/25 Yes [provider]  folic acid  (FOLVITE ) 1 MG tablet Take 1 tablet (1 mg total) by mouth daily. 03/01/24  Yes Theophilus Pagan, MD  lipase/protease/amylase (CREON ) 12000-38000 units CPEP capsule Take 2 capsules (24,000 units of lipase total) by mouth Three (3) times a day with a meal. May also take 1 capsule (12,000 units of lipase total) with snacks (up to 3 snacks daily). 03/10/24  Yes [provider]  loperamide  (IMODIUM  A-D) 2 MG tablet Take 2 mg by mouth 4 (four) times daily as needed for diarrhea or loose stools.   Yes [provider]  magnesium  oxide (MAG-OX) 400 (240 Mg) MG tablet Take 1 tablet (400 mg total) by mouth daily. 11/07/23  Yes Hoy Fraction F, PA-C  Multiple Vitamin (MULTIVITAMIN WITH MINERALS) TABS tablet Take 1 tablet by mouth daily. 08/21/23  Yes Ghimire, Donalda HERO, MD  nicotine  (NICODERM CQ  - DOSED IN MG/24 HOURS) 21 mg/24hr patch Place 1 patch (21 mg total) onto the skin daily. Patient taking differently: Place 21 mg onto the skin daily as needed (for smoking cessation). 11/14/23  Yes Amin, Ankit C, MD  nicotine  polacrilex (NICORETTE ) 2 MG gum Take 1 each (2 mg total) by mouth as needed for smoking cessation. 03/01/24  Yes Theophilus Pagan, MD  ondansetron  (ZOFRAN -ODT) 4 MG disintegrating tablet Take 1 tablet (4 mg total) by mouth every 8 (eight) hours as needed. 03/26/24  Yes Long, Fonda MATSU, MD  Oxycodone  HCl 10 MG TABS Take 10 mg by mouth 4 (four) times daily as needed. 04/03/24  Yes [provider]  pantoprazole  (PROTONIX ) 40 MG tablet Take 1 tablet (40 mg total) by mouth daily. 01/18/24  Yes Kehrli, Kelsey F, PA-C  polyethylene glycol powder (GLYCOLAX /MIRALAX ) 17 GM/SCOOP powder Take 17 g by mouth daily as needed for mild constipation. 11/13/23  Yes Amin, Ankit C, MD  promethazine  (PHENERGAN ) 25 MG tablet Take 1 tablet (25 mg total) by mouth every 8 (eight) hours as needed for up to 4 days for nausea or vomiting.  04/06/24 04/16/25 Yes Kammerer, Megan L, DO  sucralfate  (CARAFATE ) 1 GM/10ML suspension Take 1 g by mouth 4 (four) times daily -  with meals and at bedtime.   Yes [provider]  tamsulosin  (FLOMAX ) 0.4 MG CAPS capsule Take 1 capsule (0.4 mg total) by mouth daily. 03/01/24  Yes Theophilus Pagan, MD  thiamine  (VITAMIN B-1) 100 MG tablet Take 1 tablet (100 mg total) by mouth daily. 03/01/24  Yes Theophilus Pagan, MD  ferrous sulfate  325 (65 FE) MG tablet Take 1 tablet (325 mg total) by mouth daily with breakfast. Patient not taking: Reported on 04/16/2024 03/01/24   Theophilus Pagan, MD  naloxone  (NARCAN ) nasal spray 4 mg/0.1 mL Place 1 spray into the nose daily as needed (Opioid overdose). 03/01/24   Theophilus Pagan, MD  senna-docusate (SENOKOT-S) 8.6-50 MG tablet Take 1 tablet by mouth at bedtime as needed for mild constipation. Patient not taking: Reported on 04/16/2024 03/26/24   Darra Fonda MATSU, MD  amitriptyline  (ELAVIL ) 25 MG tablet Take 1 tablet (25 mg total) by mouth at bedtime.  Patient not taking: Reported on 08/08/2019 10/15/18 08/08/19  Tobie Yetta HERO, MD    ___________________________________________________________________________________________________ Physical Exam:    04/16/2024   11:00 PM 04/16/2024   10:45 PM 04/16/2024    9:45 PM  Vitals with BMI  Systolic   105  Diastolic   79  Pulse 104 88 117     1. General:  in No ***Acute distress***increased work of breathing ***complaining of severe pain****agitated * Chronically ill *well *cachectic *toxic acutely ill -appearing 2. Psychological: Alert and *** Oriented 3. Head/ENT:   Moist *** Dry Mucous Membranes                          Head Non traumatic, neck supple                          Normal *** Poor Dentition 4. SKIN: normal *** decreased Skin turgor,  Skin clean Dry and intact no rash    5. Heart: Regular rate and rhythm no*** Murmur, no Rub or gallop 6. Lungs: ***Clear to auscultation bilaterally, no wheezes  or crackles   7. Abdomen: Soft, ***non-tender, Non distended *** obese ***bowel sounds present 8. Lower extremities: no clubbing, cyanosis, no ***edema 9. Neurologically Grossly intact, moving all 4 extremities equally *** strength 5 out of 5 in all 4 extremities cranial nerves II through XII intact 10. MSK: Normal range of motion    Chart has been reviewed  ______________________________________________________________________________________________  Assessment/Plan  ***  Admitted for *** Atrial fibrillation with RVR (HCC) ***    Present on Admission: **None**     No problem-specific Assessment & Plan notes found for this encounter.    Other plan as per orders.  DVT prophylaxis:  SCD *** Lovenox        Code Status:    Code Status: Prior FULL CODE *** DNR/DNI ***comfort care as per patient ***family  I had personally discussed CODE STATUS with patient and family*  ACP *** none has been reviewed ***   Family Communication:   Family not at  Bedside  plan of care was discussed on the phone with *** Son, Daughter, Wife, Husband, Sister, Brother , father, mother  Diet  Diet Orders (From admission, onward)     Start     Ordered   04/16/24 1643  Diet NPO time specified  Diet effective now        04/16/24 1644            Disposition Plan:   *** likely will need placement for rehabilitation                          Back to current facility when stable                            To home once workup is complete and patient is stable  ***Following barriers for discharge:                             Chest pain *** Stroke *** Syncope ***work up is complete                            Electrolytes corrected  Anemia corrected h/H stable                             Pain controlled with PO medications                               Afebrile, white count improving able to transition to PO antibiotics                             Will need to be  able to tolerate PO                            Will likely need home health, home O2, set up                           Will need consultants to evaluate patient prior to discharge                           Work of breathing improves                        ***Would benefit from PT/OT eval prior to DC  Ordered                   Swallow eval - SLP ordered                   Diabetes care coordinator                   Transition of care consulted                   Nutrition    consulted                  Wound care  consulted                   Palliative care    consulted                   Behavioral health  consulted                    Consults called: ***  NONE   Admission status:  ED Disposition     ED Disposition  Admit   Condition  --   Comment  The patient appears reasonably stabilized for admission considering the current resources, flow, and capabilities available in the ED at this time, and I doubt any other [facility] requiring further screening and/or treatment in the ED prior to admission is  present.           Obs***  ***  inpatient     I Expect 2 midnight stay secondary to severity of patient's current illness need for inpatient interventions justified by the following: ***hemodynamic instability despite optimal treatment (tachycardia *hypotension * tachypnea *hypoxia, hypercapnia) *** Severe lab/radiological/exam abnormalities including:    Atrial fibrillation with RVR (HCC) ***  and extensive comorbidities including: *substance abuse  *Chronic pain *DM2  * CHF * CAD  * COPD/asthma *Morbid Obesity * CKD *dementia *liver disease *history of stroke with residual deficits *  malignancy, * sickle cell disease  History of amputation  Chronic anticoagulation  That are currently affecting medical management.   I expect  patient to be hospitalized for 2 midnights requiring inpatient medical care.  Patient is at high risk for adverse outcome (such as loss of  life or disability) if not treated.  Indication for inpatient stay as follows:  Severe change from baseline regarding mental status Hemodynamic instability despite maximal medical therapy,  severe pain requiring acute inpatient management,  inability to maintain oral hydration   persistent chest pain despite medical management Need for operative/procedural  intervention New or worsening hypoxia ongoing suicidal ideations   Need for IV antibiotics, IV fluids,, IV pain medications, IV anticoagulation,  IV rate controling medications, IV antihypertensives need for biPAP Frequent labs    Level of care   *** tele  For 12H 24H     medical floor       progressive     stepdown   tele indefinitely please discontinue once patient no longer qualifies COVID-19 Labs    Critical***  Patient is critically ill due to  hemodynamic instability * respiratory failure *severe sepsis* ongoing chest pain*  They are at high risk for life/limb threatening clinical deterioration requiring frequent reassessment and modifications of care.  Services provided include examination of the patient, review of relevant ancillary tests, prescription of lifesaving therapies, review of medications and prophylactic therapy.  Total critical care time excluding separately billable procedures: 60*  Minutes.    Brittish Bolinger 04/16/2024, 11:15 PM ***  [facility]     after 2 AM please page floor coverage   If 7AM-7PM, please contact the day team taking care of the patient using [email or web address]

## 2024-04-17 ENCOUNTER — Observation Stay (HOSPITAL_COMMUNITY): Payer: MEDICAID

## 2024-04-17 ENCOUNTER — Telehealth (HOSPITAL_COMMUNITY): Payer: Self-pay

## 2024-04-17 ENCOUNTER — Encounter (HOSPITAL_COMMUNITY): Payer: Self-pay | Admitting: Internal Medicine

## 2024-04-17 ENCOUNTER — Other Ambulatory Visit (HOSPITAL_COMMUNITY): Payer: Self-pay

## 2024-04-17 DIAGNOSIS — D509 Iron deficiency anemia, unspecified: Secondary | ICD-10-CM | POA: Diagnosis present

## 2024-04-17 DIAGNOSIS — F17210 Nicotine dependence, cigarettes, uncomplicated: Secondary | ICD-10-CM | POA: Diagnosis present

## 2024-04-17 DIAGNOSIS — K860 Alcohol-induced chronic pancreatitis: Secondary | ICD-10-CM | POA: Diagnosis present

## 2024-04-17 DIAGNOSIS — J4489 Other specified chronic obstructive pulmonary disease: Secondary | ICD-10-CM | POA: Diagnosis present

## 2024-04-17 DIAGNOSIS — Z6372 Alcoholism and drug addiction in family: Secondary | ICD-10-CM | POA: Diagnosis not present

## 2024-04-17 DIAGNOSIS — R079 Chest pain, unspecified: Secondary | ICD-10-CM | POA: Diagnosis not present

## 2024-04-17 DIAGNOSIS — K279 Peptic ulcer, site unspecified, unspecified as acute or chronic, without hemorrhage or perforation: Secondary | ICD-10-CM | POA: Diagnosis present

## 2024-04-17 DIAGNOSIS — I4891 Unspecified atrial fibrillation: Secondary | ICD-10-CM | POA: Diagnosis not present

## 2024-04-17 DIAGNOSIS — Z91040 Latex allergy status: Secondary | ICD-10-CM | POA: Diagnosis not present

## 2024-04-17 DIAGNOSIS — F10239 Alcohol dependence with withdrawal, unspecified: Secondary | ICD-10-CM | POA: Diagnosis present

## 2024-04-17 DIAGNOSIS — I480 Paroxysmal atrial fibrillation: Secondary | ICD-10-CM | POA: Diagnosis present

## 2024-04-17 DIAGNOSIS — Z91041 Radiographic dye allergy status: Secondary | ICD-10-CM | POA: Diagnosis not present

## 2024-04-17 DIAGNOSIS — R569 Unspecified convulsions: Secondary | ICD-10-CM | POA: Diagnosis present

## 2024-04-17 DIAGNOSIS — Z604 Social exclusion and rejection: Secondary | ICD-10-CM | POA: Diagnosis present

## 2024-04-17 DIAGNOSIS — Z886 Allergy status to analgesic agent status: Secondary | ICD-10-CM | POA: Diagnosis not present

## 2024-04-17 DIAGNOSIS — Z811 Family history of alcohol abuse and dependence: Secondary | ICD-10-CM | POA: Diagnosis not present

## 2024-04-17 DIAGNOSIS — K219 Gastro-esophageal reflux disease without esophagitis: Secondary | ICD-10-CM | POA: Diagnosis present

## 2024-04-17 DIAGNOSIS — Z8249 Family history of ischemic heart disease and other diseases of the circulatory system: Secondary | ICD-10-CM | POA: Diagnosis not present

## 2024-04-17 DIAGNOSIS — Z8701 Personal history of pneumonia (recurrent): Secondary | ICD-10-CM | POA: Diagnosis not present

## 2024-04-17 DIAGNOSIS — G8929 Other chronic pain: Secondary | ICD-10-CM | POA: Diagnosis present

## 2024-04-17 DIAGNOSIS — I5032 Chronic diastolic (congestive) heart failure: Secondary | ICD-10-CM | POA: Diagnosis present

## 2024-04-17 DIAGNOSIS — Z808 Family history of malignant neoplasm of other organs or systems: Secondary | ICD-10-CM | POA: Diagnosis not present

## 2024-04-17 DIAGNOSIS — Z79899 Other long term (current) drug therapy: Secondary | ICD-10-CM | POA: Diagnosis not present

## 2024-04-17 DIAGNOSIS — F1410 Cocaine abuse, uncomplicated: Secondary | ICD-10-CM | POA: Diagnosis present

## 2024-04-17 DIAGNOSIS — I110 Hypertensive heart disease with heart failure: Secondary | ICD-10-CM | POA: Diagnosis present

## 2024-04-17 LAB — IRON AND TIBC
Iron: 68 ug/dL (ref 45–182)
Saturation Ratios: 16 % — ABNORMAL LOW (ref 17.9–39.5)
TIBC: 423 ug/dL (ref 250–450)
UIBC: 355 ug/dL

## 2024-04-17 LAB — COMPREHENSIVE METABOLIC PANEL WITH GFR
ALT: 23 U/L (ref 0–44)
AST: 28 U/L (ref 15–41)
Albumin: 3.3 g/dL — ABNORMAL LOW (ref 3.5–5.0)
Alkaline Phosphatase: 72 U/L (ref 38–126)
Anion gap: 11 (ref 5–15)
BUN: 5 mg/dL — ABNORMAL LOW (ref 6–20)
CO2: 21 mmol/L — ABNORMAL LOW (ref 22–32)
Calcium: 7.7 mg/dL — ABNORMAL LOW (ref 8.9–10.3)
Chloride: 105 mmol/L (ref 98–111)
Creatinine, Ser: 1.01 mg/dL (ref 0.61–1.24)
GFR, Estimated: >60 mL/min (ref >60)
Glucose, Bld: 120 mg/dL — ABNORMAL HIGH (ref 70–99)
Potassium: 3.7 mmol/L (ref 3.5–5.1)
Sodium: 137 mmol/L (ref 135–145)
Total Bilirubin: 0.9 mg/dL (ref 0.0–1.2)
Total Protein: 6.6 g/dL (ref 6.5–8.1)

## 2024-04-17 LAB — URINALYSIS, COMPLETE (UACMP) WITH MICROSCOPIC
Bacteria, UA: NONE SEEN
Bilirubin Urine: NEGATIVE
Glucose, UA: NEGATIVE mg/dL
Hgb urine dipstick: NEGATIVE
Ketones, ur: NEGATIVE mg/dL
Leukocytes,Ua: NEGATIVE
Nitrite: NEGATIVE
Protein, ur: NEGATIVE mg/dL
Specific Gravity, Urine: 1.006 (ref 1.005–1.030)
pH: 6 (ref 5.0–8.0)

## 2024-04-17 LAB — RETICULOCYTES
Immature Retic Fract: 25.8 % — ABNORMAL HIGH (ref 2.3–15.9)
RBC.: 3.3 MIL/uL — ABNORMAL LOW (ref 4.22–5.81)
Retic Count, Absolute: 74.3 K/uL (ref 19.0–186.0)
Retic Ct Pct: 2.3 % (ref 0.4–3.1)

## 2024-04-17 LAB — LACTIC ACID, PLASMA
Lactic Acid, Venous: 1.6 mmol/L (ref 0.5–1.9)
Lactic Acid, Venous: 1.6 mmol/L (ref 0.5–1.9)

## 2024-04-17 LAB — BLOOD GAS, VENOUS
Acid-base deficit: 4.9 mmol/L — ABNORMAL HIGH (ref 0.0–2.0)
Bicarbonate: 19.8 mmol/L — ABNORMAL LOW (ref 20.0–28.0)
O2 Saturation: 96.4 %
Patient temperature: 36.2
pCO2, Ven: 34 mmHg — ABNORMAL LOW (ref 44–60)
pH, Ven: 7.37 (ref 7.25–7.43)
pO2, Ven: 71 mmHg — ABNORMAL HIGH (ref 32–45)

## 2024-04-17 LAB — HEPARIN LEVEL (UNFRACTIONATED): Heparin Unfractionated: 0.38 [IU]/mL (ref 0.30–0.70)

## 2024-04-17 LAB — ECHOCARDIOGRAM COMPLETE
AR max vel: 2.08 cm2
AV Area VTI: 2.75 cm2
AV Area mean vel: 2.12 cm2
AV Mean grad: 3 mmHg
AV Peak grad: 6.3 mmHg
Ao pk vel: 1.25 m/s
Calc EF: 52.2 %
Height: 68 in
MV VTI: 2.69 cm2
S' Lateral: 2.9 cm
Single Plane A2C EF: 50.2 %
Single Plane A4C EF: 60.5 %
Weight: 1887.98 [oz_av]

## 2024-04-17 LAB — CBC
HCT: 30.5 % — ABNORMAL LOW (ref 39.0–52.0)
HCT: 32.8 % — ABNORMAL LOW (ref 39.0–52.0)
Hemoglobin: 10 g/dL — ABNORMAL LOW (ref 13.0–17.0)
Hemoglobin: 10.5 g/dL — ABNORMAL LOW (ref 13.0–17.0)
MCH: 30.3 pg (ref 26.0–34.0)
MCH: 30.7 pg (ref 26.0–34.0)
MCHC: 32 g/dL (ref 30.0–36.0)
MCHC: 32.8 g/dL (ref 30.0–36.0)
MCV: 93.6 fL (ref 80.0–100.0)
MCV: 94.8 fL (ref 80.0–100.0)
Platelets: 180 K/uL (ref 150–400)
Platelets: 183 K/uL (ref 150–400)
RBC: 3.26 MIL/uL — ABNORMAL LOW (ref 4.22–5.81)
RBC: 3.46 MIL/uL — ABNORMAL LOW (ref 4.22–5.81)
RDW: 18.6 % — ABNORMAL HIGH (ref 11.5–15.5)
RDW: 18.9 % — ABNORMAL HIGH (ref 11.5–15.5)
WBC: 8.5 K/uL (ref 4.0–10.5)
WBC: 8.9 K/uL (ref 4.0–10.5)
nRBC: 0 % (ref 0.0–0.2)
nRBC: 0 % (ref 0.0–0.2)

## 2024-04-17 LAB — MAGNESIUM
Magnesium: 1.5 mg/dL — ABNORMAL LOW (ref 1.7–2.4)
Magnesium: 1.5 mg/dL — ABNORMAL LOW (ref 1.7–2.4)
Magnesium: 2 mg/dL (ref 1.7–2.4)

## 2024-04-17 LAB — CK: Total CK: 125 U/L (ref 49–397)

## 2024-04-17 LAB — PHOSPHORUS
Phosphorus: 4 mg/dL (ref 2.5–4.6)
Phosphorus: 3.9 mg/dL (ref 2.5–4.6)

## 2024-04-17 LAB — FOLATE: Folate: 12.8 ng/mL (ref >5.9)

## 2024-04-17 LAB — VITAMIN B12: Vitamin B-12: 342 pg/mL (ref 180–914)

## 2024-04-17 LAB — FERRITIN: Ferritin: 14 ng/mL — ABNORMAL LOW (ref 24–336)

## 2024-04-17 LAB — T4, FREE: Free T4: 0.69 ng/dL (ref 0.61–1.12)

## 2024-04-17 MED ORDER — SODIUM CHLORIDE 0.9% FLUSH: 3.0000 mL | INTRAVENOUS | Status: DC | PRN

## 2024-04-17 MED ORDER — FOLIC ACID 1 MG PO TABS
1.0000 mg | ORAL_TABLET | Freq: Every day | ORAL | Status: DC
  Administered 2024-04-17 – 2024-04-19 (×3): 1 mg via ORAL
  Filled 2024-04-17 (×3): qty 1

## 2024-04-17 MED ORDER — ADULT MULTIVITAMIN W/MINERALS CH
1.0000 | ORAL_TABLET | Freq: Every day | ORAL | Status: DC
  Administered 2024-04-17 – 2024-04-19 (×3): 1 via ORAL
  Filled 2024-04-17 (×3): qty 1

## 2024-04-17 MED ORDER — ONDANSETRON HCL 4 MG PO TABS: 4.0000 mg | ORAL_TABLET | Freq: Four times a day (QID) | ORAL | Status: DC | PRN

## 2024-04-17 MED ORDER — RIVAROXABAN 20 MG PO TABS
20.0000 mg | ORAL_TABLET | Freq: Every day | ORAL | Status: DC
  Administered 2024-04-17 – 2024-04-18 (×2): 20 mg via ORAL
  Filled 2024-04-17 (×2): qty 1

## 2024-04-17 MED ORDER — SODIUM CHLORIDE 0.9 % IV SOLN: INTRAVENOUS | Status: DC

## 2024-04-17 MED ORDER — OXYCODONE HCL 5 MG PO TABS
10.0000 mg | ORAL_TABLET | Freq: Four times a day (QID) | ORAL | Status: DC | PRN
  Administered 2024-04-17 – 2024-04-19 (×6): 10 mg via ORAL
  Filled 2024-04-17 (×8): qty 2

## 2024-04-17 MED ORDER — ACETAMINOPHEN 650 MG RE SUPP: 650.0000 mg | Freq: Four times a day (QID) | RECTAL | Status: DC | PRN

## 2024-04-17 MED ORDER — FENTANYL CITRATE (PF) 50 MCG/ML IJ SOSY
12.5000 ug | PREFILLED_SYRINGE | INTRAMUSCULAR | Status: DC | PRN
  Administered 2024-04-17 – 2024-04-19 (×7): 50 ug via INTRAVENOUS
  Filled 2024-04-17 (×7): qty 1

## 2024-04-17 MED ORDER — HALOPERIDOL LACTATE 5 MG/ML IJ SOLN
5.0000 mg | Freq: Four times a day (QID) | INTRAMUSCULAR | Status: DC | PRN
  Administered 2024-04-17: 5 mg via INTRAMUSCULAR
  Filled 2024-04-17: qty 1

## 2024-04-17 MED ORDER — GUAIFENESIN ER 600 MG PO TB12
600.0000 mg | ORAL_TABLET | Freq: Two times a day (BID) | ORAL | Status: DC
  Administered 2024-04-17 – 2024-04-19 (×5): 600 mg via ORAL
  Filled 2024-04-17 (×5): qty 1

## 2024-04-17 MED ORDER — SODIUM CHLORIDE 0.9% FLUSH
3.0000 mL | Freq: Two times a day (BID) | INTRAVENOUS | Status: DC
  Administered 2024-04-17 – 2024-04-19 (×4): 3 mL via INTRAVENOUS

## 2024-04-17 MED ORDER — SODIUM CHLORIDE 0.9 % IV SOLN: 250.0000 mL | INTRAVENOUS | Status: AC | PRN

## 2024-04-17 MED ORDER — LORAZEPAM 2 MG/ML IJ SOLN
1.0000 mg | INTRAMUSCULAR | Status: DC | PRN
  Administered 2024-04-17: 4 mg via INTRAVENOUS
  Administered 2024-04-17: 2 mg via INTRAVENOUS
  Administered 2024-04-17: 4 mg via INTRAVENOUS
  Administered 2024-04-18: 1 mg via INTRAVENOUS
  Administered 2024-04-19 (×2): 2 mg via INTRAVENOUS
  Filled 2024-04-17 (×2): qty 1
  Filled 2024-04-17: qty 2
  Filled 2024-04-17: qty 1
  Filled 2024-04-17: qty 2
  Filled 2024-04-17: qty 1

## 2024-04-17 MED ORDER — ACETAMINOPHEN 325 MG PO TABS
650.0000 mg | ORAL_TABLET | Freq: Four times a day (QID) | ORAL | Status: DC | PRN
  Administered 2024-04-18: 650 mg via ORAL
  Filled 2024-04-17: qty 2

## 2024-04-17 MED ORDER — NICOTINE 14 MG/24HR TD PT24
14.0000 mg | MEDICATED_PATCH | Freq: Every day | TRANSDERMAL | Status: DC
  Administered 2024-04-17 – 2024-04-19 (×3): 14 mg via TRANSDERMAL
  Filled 2024-04-17 (×3): qty 1

## 2024-04-17 MED ORDER — LORAZEPAM 1 MG PO TABS
1.0000 mg | ORAL_TABLET | ORAL | Status: DC | PRN
  Administered 2024-04-17: 1 mg via ORAL
  Administered 2024-04-18: 2 mg via ORAL
  Administered 2024-04-18: 1 mg via ORAL
  Administered 2024-04-18: 3 mg via ORAL
  Administered 2024-04-18: 1 mg via ORAL
  Administered 2024-04-18 – 2024-04-19 (×2): 2 mg via ORAL
  Filled 2024-04-17: qty 3
  Filled 2024-04-17 (×2): qty 2
  Filled 2024-04-17 (×2): qty 1
  Filled 2024-04-17 (×2): qty 2

## 2024-04-17 MED ORDER — ONDANSETRON HCL 4 MG/2ML IJ SOLN
4.0000 mg | Freq: Four times a day (QID) | INTRAMUSCULAR | Status: DC | PRN
  Administered 2024-04-18: 4 mg via INTRAVENOUS
  Filled 2024-04-17: qty 2

## 2024-04-17 MED ORDER — THIAMINE MONONITRATE 100 MG PO TABS
100.0000 mg | ORAL_TABLET | Freq: Every day | ORAL | Status: DC
  Administered 2024-04-17 – 2024-04-19 (×3): 100 mg via ORAL
  Filled 2024-04-17 (×3): qty 1

## 2024-04-17 MED ORDER — THIAMINE HCL 100 MG/ML IJ SOLN
100.0000 mg | Freq: Every day | INTRAMUSCULAR | Status: DC
  Filled 2024-04-17: qty 2

## 2024-04-17 MED ORDER — MAGNESIUM SULFATE 2 GM/50ML IV SOLN
2.0000 g | Freq: Once | INTRAVENOUS | Status: AC
  Administered 2024-04-17: 2 g via INTRAVENOUS
  Filled 2024-04-17: qty 50

## 2024-04-17 NOTE — Assessment & Plan Note (Signed)
 CIWA protocol In the past patient with history of seizures in the setting of alcohol  withdrawal monitor for any sign of alcohol  withdrawal monitor on progressive

## 2024-04-17 NOTE — Progress Notes (Signed)
 PHARMACY - ANTICOAGULATION CONSULT NOTE  Pharmacy Consult for heparin  Indication: atrial fibrillation  Labs: Recent Labs    04/16/24 1643 04/16/24 1844 04/17/24 0022 04/17/24 0502  HGB 11.5*  --  10.0* 10.5*  HCT 35.6*  --  30.5* 32.8*  PLT 196  --  183 180  HEPARINUNFRC  --   --   --  0.38  CREATININE 0.96  --   --   --   CKTOTAL  --   --  125  --   TROPONINIHS 6 7  --   --    Assessment/Plan:  53yo male therapeutic on heparin  with initial dosing for Afib. Will continue infusion at current rate of 850 units/hr and confirm stable with additional level.  Marvetta Dauphin, PharmD, BCPS 04/17/2024 5:43 AM

## 2024-04-17 NOTE — Assessment & Plan Note (Signed)
Xopenex as needed

## 2024-04-17 NOTE — Assessment & Plan Note (Signed)
 Continue continue Creon 

## 2024-04-17 NOTE — Assessment & Plan Note (Addendum)
 In the setting of WPW syndrome Appreciate cardiology consult With difficult to manage heart rate Patient at baseline on Coreg  Recent cocaine on board In emergency department started on diltiazem  drip heart rate now in 110 Patient is symptomatically improving Cardiology recommending restarting home diltiazem  when stable or if need additional HR control at night  continuing heparin 

## 2024-04-17 NOTE — Assessment & Plan Note (Signed)
Appreciate cardiology consult °

## 2024-04-17 NOTE — ED Notes (Signed)
 Pt pulled out both IV's with diltiazem  and heparin  running due to increased agitation and ran down hallway. Security escorted pt back to room. This nurse consulted pharmacy about running IV meds through one line, was told all were compatible. 22G in right forearm started. Pt continuing to pull at cords, undress, and exhibit agitation. 4mg  Ativan  given per CIWA protocol.

## 2024-04-17 NOTE — Assessment & Plan Note (Signed)
-

## 2024-04-17 NOTE — Progress Notes (Signed)
 Patient is refusing safety measures, bed alarm. Education unsuccessful. Md notified.

## 2024-04-17 NOTE — TOC Initial Note (Signed)
 Transition of Care [facility]) - Initial/Assessment Note    Patient Details  Name: [name] MRN: [id number] Date of Birth: January 27, 1971  Transition of Care [facility] CM/SW Contact:    Isaiah Public, LCSWA Phone Number: 04/17/2024, 4:11 PM  Clinical Narrative:                    Patient from home. Patient presents with Irregular heart beat;shortness of breath.CSW received consult for patient. CSW following to complete assessment when appropriate. TOC will continue to follow.     Patient Goals and CMS Choice            Expected Discharge Plan and Services                                              Prior Living Arrangements/Services                       Activities of Daily Living   ADL Screening (condition at time of admission) Independently performs ADLs?: Yes (appropriate for developmental age) Is the patient deaf or have difficulty hearing?: No Does the patient have difficulty seeing, even when wearing glasses/contacts?: No Does the patient have difficulty concentrating, remembering, or making decisions?: Yes  Permission Sought/Granted                  Emotional Assessment              Admission diagnosis:  Atrial fibrillation with RVR (HCC) [I48.91] Patient Active Problem List   Diagnosis Date Noted   Anemia 02/25/2024   Atrial fibrillation with RVR (HCC) 02/20/2024   Chronic health problem 02/20/2024   Atrial fibrillation with rapid ventricular response (HCC) 12/03/2023   Abdominal pain 11/22/2023   Hypertensive urgency 11/12/2023   Syncope and collapse 11/10/2023   Non-cardiac chest pain 11/10/2023   Intractable diarrhea 08/10/2023   Intractable nausea and vomiting 06/18/2023   History of peptic ulcer 03/07/2023   Lumbosacral radiculopathy 03/07/2023   Duodenitis 03/06/2023   Gastric AVM 03/06/2023   AVM (arteriovenous malformation) of small bowel, 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   Hyperkalemia 03/03/2023   Acute urinary  retention 03/01/2023   Intractable pain 02/09/2023   Metabolic acidosis, increased anion gap 02/08/2023   Cocaine abuse (HCC) 02/08/2023   Adynamic ileus (HCC) 01/25/2023   Cannabinoid hyperemesis syndrome 01/25/2023   Epigastric abdominal pain 01/09/2023   Asthma, chronic 01/09/2023   Ileus (HCC) 01/09/2023   Hypokalemia 09/18/2022   NSVT (nonsustained ventricular tachycardia) (HCC) 01/20/2022   Normal anion gap metabolic acidosis 12/08/2021   Hypoalbuminemia 12/08/2021   WPW (Wolff-Parkinson-White syndrome) 12/06/2021   Paroxysmal atrial fibrillation (HCC) 12/06/2021   Pulmonary nodule 12/06/2021   Leukocytosis 06/07/2021   Epigastric pain 06/07/2021   Urinary retention    Protein-calorie malnutrition, severe 11/17/2020   Hypomagnesemia    Hypocalcemia 10/23/2020   Seizure (HCC) 11/13/2018   Acute on chronic pancreatitis (HCC) 09/28/2017   Tachycardia 03/18/2017   Nausea, vomiting, and diarrhea 03/18/2017   Diarrhea 03/18/2017   GERD (gastroesophageal reflux disease)    Chronic alcoholic pancreatitis (HCC) 12/05/2016   Alcohol  use disorder 12/05/2016   Normocytic anemia 12/05/2016   Alcohol  use disorder, severe, dependence (HCC) 07/25/2016   Major depressive disorder, 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   Chronic pancreatitis (HCC) 05/18/2015   Polysubstance abuse (tobacco, cocaine, THC, and ETOH)  03/26/2015   Essential hypertension 02/06/2014   Mood disorder (HCC) 02/06/2014   Pancreatic pseudocyst/cyst 11/25/2013   Substance use disorder 10/06/2013   Kassie Pour White pattern seen on electrocardiogram 10/03/2012   Alcohol  abuse 03/23/2007   Tobacco abuse 03/23/2007   PCP:  Myra Wanda LABOR, MD Pharmacy:   [id number] - [place], [place] - [address] AT [facility] [address] [facility] [address] [place] [address] Phone: [phone] Fax: [phone]     Social Drivers of Health (SDOH) Social  History: SDOH Screenings   Food Insecurity: No Food Insecurity (04/17/2024)  Housing: Low Risk  (04/17/2024)  Transportation Needs: No Transportation Needs (04/17/2024)  Utilities: Not At Risk (04/17/2024)  Alcohol  Screen: Medium Risk (03/14/2017)  Depression (PHQ2-9): Medium Risk (03/27/2020)  Social Connections: Socially Isolated (11/10/2023)  Tobacco Use: High Risk (04/17/2024)   SDOH Interventions:     Readmission Risk Interventions    06/19/2023    4:11 PM 03/07/2023    4:21 PM 01/10/2023    4:32 PM  Readmission Risk Prevention Plan  Transportation Screening Complete Complete Complete  Medication Review (RN Care Manager) Referral to Pharmacy Complete Complete  PCP or Specialist appointment within 3-5 days of discharge Complete Complete Complete  HRI or Home Care Consult Complete Complete Complete  SW Recovery Care/Counseling Consult Complete Complete Complete  Palliative Care Screening Not Applicable Not Applicable Not Applicable  Skilled Nursing Facility Not Applicable Not Applicable Not Applicable

## 2024-04-17 NOTE — Progress Notes (Addendum)
 TRH night cross cover note:   I was notified by the patient's RN that this patient is currently refusing safety measures, including bed alarm.      Eva Pore, DO Hospitalist

## 2024-04-17 NOTE — Assessment & Plan Note (Signed)
 -  Spoke about importance of quitting spent 5 minutes discussing options for treatment, prior attempts at quitting, and dangers of smoking ? -At this point patient is    NOT  interested in quitting ? - order nicotine patch  ? - nursing tobacco cessation protocol ? ?

## 2024-04-17 NOTE — ED Notes (Signed)
 Pt continues to be verbally abusive to nursing staff even after redirection and being told that we are checking on his pain prescriptions and diet orders. Pt not allowing staff to close his door and yelling at nurses from bed at the nursing station.

## 2024-04-17 NOTE — Progress Notes (Signed)
 PROGRESS NOTE    [name]  FMW:[id number] DOB: April 09, 1971 DOA: 04/16/2024 PCP: Myra Wanda LABOR, MD   Brief Narrative:  [name] is a 53 y.o. male with medical history significant of chronic pancreatitis,, Wolf Parkinson White syndrome A-fib, peptic ulcer disease, asthma, seizure-like activity, alcohol  abuse, polysubstance abuse, anemia, hypertension, bipolar, tobacco abuse, chronic diastolic chf presented to ED with shortness of breath and chest pain which started few hours before presenting to ED.  Upon arrival to ED, he was found to be in A-fib with RVR with rates up to 200s he was given 20 of diltiazem  which brought heart rate down to 170.  Reportedly he is supposed to be taking Coreg  and diltiazem  but ran out of his diltiazem  and has been taking his old metoprolol  instead. Last time cocaine use was 5 days ago. He continues to drink and last alcoholic drink was yesterday. He drinks between 7-8 beers a day  Assessment & Plan:   Principal Problem:   Atrial fibrillation with RVR (HCC) Active Problems:   Paroxysmal atrial fibrillation (HCC)   Alcohol  abuse   Tobacco abuse   Chronic alcoholic pancreatitis (HCC)   Normocytic anemia   GERD (gastroesophageal reflux disease)   Seizure (HCC)   Hypomagnesemia   WPW (Wolff-Parkinson-White syndrome)   Asthma, chronic   Cocaine abuse (HCC)   Abdominal pain  History of WPW syndrome status post ablation now with A-fib with RVR, POA: Patient has been started on Cardizem  drip, still rates around 120 but he remains asymptomatic.  Cardiology on board and managing.  Patient also on heparin .  Alcohol  abuse/dependence: At risk of withdrawal, continue CIWA protocol.  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  Chronic abdominal pain due to chronic pancreatitis: Noted.  Continue Creon .  Asthma/emphysema: CT chest ruled out PE but confirms emphysema and he already has history of asthma.  Continue as  needed Xopenex .  Polysubstance abuse: UDS positive for THC as well as cocaine, patient has been counseled to quit multiple times previously and again by myself.  GERD: Patient has chronic gastric wall thickening per CT scan.  Continue Protonix .  Hypomagnesemia: Replenished this morning.  Checking at noon and replenish again as needed.  Chronic iron deficiency anemia: Hemoglobin stable.  Monitor.  Tobacco abuse: I have discussed tobacco cessation with the patient.  I have counseled the patient regarding the negative impacts of continued tobacco use including but not limited to lung cancer, COPD, and cardiovascular disease.  I have discussed alternatives to tobacco and modalities that may help facilitate tobacco cessation including but not limited to biofeedback, hypnosis, and medications.  Total time spent with tobacco counseling was 5 minutes.   DVT prophylaxis: Heparin    Code Status: Full Code  Family Communication:  None present at bedside.  Plan of care discussed with patient in length and he/she verbalized understanding and agreed with it.  Status is: Observation The patient will require care spanning > 2 midnights and should be moved to inpatient because: Still with A-fib with RVR and requiring Cardizem  drip   Estimated body mass index is 17.94 kg/m as calculated from the following:   Height as of this encounter: 5' 8 (1.727 m).   Weight as of this encounter: 53.5 kg.    Nutritional Assessment: Body mass index is 17.94 kg/m.SABRA Seen by dietician.  I agree with the assessment and plan as outlined below: Nutrition Status:        . Skin Assessment: I have examined  the patient's skin and I agree with the wound assessment as performed by the wound care RN as outlined below:    Consultants:  Cardiology  Procedures:  As above  Antimicrobials:  Anti-infectives (From admission, onward)    None         Subjective: Patient seen and examined, he was in deep sleep,  when he woke up, he was complaining of some nausea.  Initially was confused but shortly after he cleared up.  He was fully alert and oriented.  Was then complaining of some chest pain with some dizziness but it was dull and nonradiating with no diaphoresis, shortness of breath or vomiting.  Objective: Vitals:   04/17/24 0315 04/17/24 0430 04/17/24 0449 04/17/24 0645  BP: 98/72 92/68  107/83  Pulse: (!) 105 100  95  Resp: 12 12  14   Temp:   97.6 F (36.4 C)   TempSrc:   Temporal   SpO2: 98% 98%  98%  Weight:      Height:        Intake/Output Summary (Last 24 hours) at 04/17/2024 0742 Last data filed at 04/16/2024 1812 Gross per 24 hour  Intake 9.85 ml  Output --  Net 9.85 ml   Filed Weights   04/16/24 1634  Weight: 53.5 kg    Examination:  General exam: Appears calm and comfortable  Respiratory system: Clear to auscultation. Respiratory effort normal. Cardiovascular system: S1 & S2 heard, irregularly irregular RR. No JVD, murmurs, rubs, gallops or clicks. No pedal edema. Gastrointestinal system: Abdomen is nondistended, soft and nontender. No organomegaly or masses felt. Normal bowel sounds heard. Central nervous system: Alert and oriented. No focal neurological deficits. Extremities: Symmetric 5 x 5 power. Skin: No rashes, lesions or ulcers  Data Reviewed: I have personally reviewed following labs and imaging studies  CBC: Recent Labs  Lab 04/16/24 1643 04/17/24 0022 04/17/24 0502  WBC 9.3 8.9 8.5  HGB 11.5* 10.0* 10.5*  HCT 35.6* 30.5* 32.8*  MCV 94.9 93.6 94.8  PLT 196 183 180   Basic Metabolic Panel: Recent Labs  Lab 04/16/24 1643 04/17/24 0022 04/17/24 0502  NA 138  --  137  K 4.0  --  3.7  CL 110  --  105  CO2 17*  --  21*  GLUCOSE 188*  --  120*  BUN 6  --  5*  CREATININE 0.96  --  1.01  CALCIUM  7.3*  --  7.7*  MG 1.1* 1.5* 1.5*  PHOS  --  4.0 3.9   GFR: Estimated Creatinine Clearance: 63.3 mL/min (by C-G formula based on SCr of 1.01  mg/dL). Liver Function Tests: Recent Labs  Lab 04/16/24 1844 04/17/24 0502  AST 37 28  ALT 23 23  ALKPHOS 69 72  BILITOT 0.3 0.9  PROT 6.3* 6.6  ALBUMIN  3.0* 3.3*   Recent Labs  Lab 04/16/24 1844  LIPASE 21   No results for input(s): AMMONIA in the last 168 hours. Coagulation Profile: No results for input(s): INR, PROTIME in the last 168 hours. Cardiac Enzymes: Recent Labs  Lab 04/17/24 0022  CKTOTAL 125   BNP (last 3 results) No results for input(s): PROBNP in the last 8760 hours. HbA1C: No results for input(s): HGBA1C in the last 72 hours. CBG: No results for input(s): GLUCAP in the last 168 hours. Lipid Profile: No results for input(s): CHOL, HDL, LDLCALC, TRIG, CHOLHDL, LDLDIRECT in the last 72 hours. Thyroid  Function Tests: Recent Labs    04/16/24 1644 04/17/24 0022  TSH 0.815  --   FREET4  --  0.69   Anemia Panel: Recent Labs    04/17/24 0022  VITAMINB12 342  FOLATE 12.8  FERRITIN 14*  TIBC 423  IRON 68  RETICCTPCT 2.3   Sepsis Labs: Recent Labs  Lab 04/17/24 0022 04/17/24 0502  LATICACIDVEN 1.6 1.6    No results found for this or any previous visit (from the past 240 hours).   Radiology Studies: CT CHEST ABDOMEN PELVIS WO CONTRAST Result Date: 04/16/2024 CLINICAL DATA:  Unintended weight loss, history of chest pain EXAM: CT CHEST, ABDOMEN AND PELVIS WITHOUT CONTRAST TECHNIQUE: Multidetector CT imaging of the chest, abdomen and pelvis was performed following the standard protocol without IV contrast. RADIATION DOSE REDUCTION: This exam was performed according to the departmental dose-optimization program which includes automated exposure control, adjustment of the mA and/or kV according to patient size and/or use of iterative reconstruction technique. COMPARISON:  04/16/2024, 03/26/2024 FINDINGS: CT CHEST FINDINGS Cardiovascular: Unenhanced imaging the heart is unremarkable without pericardial effusion. Normal caliber of  the thoracic aorta. Mediastinum/Nodes: No enlarged mediastinal, hilar, or axillary lymph nodes. Thyroid  gland, trachea, and esophagus demonstrate no significant findings. Lungs/Pleura: Biapical emphysematous changes. No acute airspace disease, effusion, or pneumothorax. Central airways are patent. Musculoskeletal: No acute or destructive bony abnormalities. Reconstructed images demonstrate no additional findings. CT ABDOMEN PELVIS FINDINGS Hepatobiliary: Unremarkable unenhanced appearance of the liver and gallbladder. Pancreas: Stable 4.4 x 4.2 cm pseudocyst within the pancreatic body. No acute inflammatory changes. Calcifications within the pancreatic parenchyma consistent with sequela of chronic calcific pancreatitis. Spleen: Unremarkable unenhanced appearance. Adrenals/Urinary Tract: No urinary tract calculi or obstructive uropathy. Unremarkable appearance of the adrenals and bladder. Stomach/Bowel: No bowel obstruction or ileus. Normal appendix right mid abdomen. There is continued diffuse gastric wall thickening, unchanged since numerous previous exams. Please correlate with previous upper endoscopy results. Vascular/Lymphatic: Aortic atherosclerosis. No enlarged abdominal or pelvic lymph nodes. Reproductive: Prostate is unremarkable. Other: No free fluid or free intraperitoneal gas. No abdominal wall hernia. Musculoskeletal: No acute or destructive bony abnormalities. Reconstructed images demonstrate no additional findings. IMPRESSION: 1. Chronic diffuse gastric wall thickening, unchanged since numerous previous exams. Please correlate with previous upper endoscopy results. 2. Sequela of chronic calcific pancreatitis, with stable 4.4 cm pseudocyst. No acute inflammatory changes. 3. No acute intra-abdominal scarred set no acute intrathoracic, intra-abdominal, or intrapelvic process. 4.  Aortic Atherosclerosis (ICD10-I70.0). 5. Pulmonary emphysema is present. Pulmonary emphysema is an independent risk factor for  lung cancer. Recommend patient be considered for lung cancer screening. [place]  Chief Financial Officer. Screening for Lung Cancer: [place]  Licensed Conveyancer. JAMA. 2021;325(10):962-970. Electronically Signed   By: Ozell Daring M.D.   On: 04/16/2024 20:49   DG Chest Port 1 View Result Date: 04/16/2024 CLINICAL DATA:  Chest pain. EXAM: PORTABLE CHEST 1 VIEW COMPARISON:  04/02/2024. FINDINGS: The heart size and mediastinal contours are within normal limits. No focal consolidation, pleural effusion, or pneumothorax. No acute osseous abnormality. IMPRESSION: No acute cardiopulmonary findings. Electronically Signed   By: Harrietta Sherry M.D.   On: 04/16/2024 17:40    Scheduled Meds:  folic acid   1 mg Oral Daily   guaiFENesin   600 mg Oral BID   multivitamin with minerals  1 tablet Oral Daily   nicotine   14 mg Transdermal Daily   sodium chloride  flush  3 mL Intravenous Q12H   thiamine   100 mg Oral Daily   Or   thiamine   100 mg Intravenous Daily   Continuous  Infusions:  sodium chloride      sodium chloride  75 mL/hr at 04/17/24 0149   diltiazem  (CARDIZEM ) infusion 15 mg/hr (04/16/24 2349)   heparin  850 Units/hr (04/16/24 2158)   magnesium  sulfate bolus IVPB       LOS: 0 days   Fredia Skeeter, MD [facility]  04/17/2024, 7:42 AM   *Please note that this is a verbal dictation therefore any spelling or grammatical errors are due to the Dragon Medical One system interpretation.  Please page via Amion and do not message via secure chat for urgent patient care matters. Secure chat can be used for non urgent patient care matters.  How to contact the TRH Attending or Consulting provider 7A - 7P or covering provider during after hours 7P -7A, for this patient?  Check the care team in [facility] and look for a) attending/consulting TRH provider listed and b) the TRH team listed. Page or secure chat 7A-7P. Log into [email or web address] and use [place]'s universal  password to access. If you do not have the password, please contact the hospital operator. Locate the TRH provider you are looking for under [facility] and page to a number that you can be directly reached. If you still have difficulty reaching the provider, please page the [facility] (Director on Call) for the Hospitalists listed on amion for assistance.

## 2024-04-17 NOTE — Telephone Encounter (Signed)
 Pharmacy Patient Advocate Encounter  Insurance verification completed.    The patient is insured through [employer] [place].     Ran test claim for Eliquis 5mg  tablet and the current 30 day co-pay is $4.  Ran test claim for Xarelto  20mg  tablet and the current 30 day co-pay is $4.  This test claim was processed through [employer]- copay amounts may vary at other pharmacies due to [employer], or as the patient moves through the different stages of their insurance plan.

## 2024-04-17 NOTE — Assessment & Plan Note (Signed)
 History of chronic pancreatitis Peptic ulcer disease Chronic diffuse gastric wall thickening on CT unchanged from prior CT scan nonacute

## 2024-04-17 NOTE — Progress Notes (Signed)
 Received a message from the RN earlier that patient was being physically and verbally aggressive to the staff and making racial comments and he ripped off IV line and was throwing stuff away and monitors etc. was asked to see this patient at bedside.  Discussed with the nurse over the phone call.  Immediately ordered Haldol  as needed IM since he lost his IV line.  Came to see the patient at the bedside.  He was knocked out by that time.  I was also informed that patient's mother was here earlier and wanted to talk to me.  However when I came to the bedside, no family member were present.  I called patient's mother at the listed number while I was with the nurse at the nursing station and had to leave voicemail for the mother.  Also discussed the case with charge nurse who happens to know this gentleman from previous hospitalizations and the fact that this patient is in [place] for such behaviors in the past and the history of leaving AGAINST MEDICAL ADVICE.  Total time spent 20 minutes

## 2024-04-17 NOTE — Assessment & Plan Note (Signed)
 Replace and recheck

## 2024-04-17 NOTE — Assessment & Plan Note (Signed)
 Emphasized the patient importance of abstaining from cocaine

## 2024-04-17 NOTE — ED Notes (Signed)
 Pt very verbally abusive to staff, hitting call light only to berate nurse and other staff. Pt seems unhappy with any helpful response given and continues to be verbally abusive

## 2024-04-17 NOTE — Progress Notes (Addendum)
 Progress Note  Patient Name: [name] Date of Encounter: 04/17/2024 [facility] Cardiologist: Lynwood Schilling, MD   Interval Summary   Recently given Ativan  while in the ER so unable to answer any questions Notes per nursing staff indicate that he was berating staff  Presently in A-Fib with rates 110s Continue to replete mag, 1.1 on arrival  Vital Signs Vitals:   04/17/24 0745 04/17/24 0800 04/17/24 0816 04/17/24 0852  BP: 113/86 109/77    Pulse: (!) 115     Resp:  15  14  Temp:   98 F (36.7 C)   TempSrc:   Oral   SpO2:      Weight:      Height:        Intake/Output Summary (Last 24 hours) at 04/17/2024 0855 Last data filed at 04/16/2024 1812 Gross per 24 hour  Intake 9.85 ml  Output --  Net 9.85 ml      04/16/2024    4:34 PM 04/02/2024    6:10 PM 03/26/2024   11:14 AM  Last 3 Weights  Weight (lbs) 118 lb 118 lb 118 lb  Weight (kg) 53.524 kg 53.524 kg 53.524 kg     Telemetry/ECG  A-Fib rates 110-120s - Personally Reviewed  Physical Exam  GEN: No acute distress.   Neck: No JVD Cardiac: iRRR, no murmurs, rubs, or gallops.  Respiratory: Clear to auscultation bilaterally. GI: Soft, nontender, non-distended  MS: No edema  Assessment & Plan   Paroxysmal atrial fibrillation with RVR History of WPW s/p ablation  Home meds: diltiazem  240 mg daily, carvedilol  25 mg daily Reports he ran out of diltiazem  and coreg  and has been taking old metoprolol  instead  Does not appear to be on OAC, has been an issue with compliance and ETOH use History of polysubstance abuse UDS positive for cocaine and THC this admission Reports cocaine use 5 days ago Drinks 7-8 beers/day Suspect that A-Fib RVR was triggered by cocaine use  Currently in A-fib with HR 110s BP has been stable  Started on IV diltiazem , running at 8.5 mg/hr Started on IV heparin  for [facility]  Continue to replete electrolytes, Mag > 2, K > 4 Consider starting back home carvedilol  if  further rate control is needed With issues with drug issue and noncompliance would not be good candidate for DCCV given he will likely not be compliant with DOAC as an outpatient -- continue to pursue rate control   Per primary Polysubstance abuse Chronic alcoholic pancreatitis  GERD Anemia Electrolyte disturbances Asthma History of seizures   For questions or updates, please contact [facility] Please consult [email or web address] for contact info under      Signed, Waddell DELENA Donath, PA-C    Patient seen and examined, note reviewed with the signed Advanced Practice Provider. I personally reviewed laboratory data, imaging studies and relevant notes. I independently examined the patient and formulated the important aspects of the plan. I have personally discussed the plan with the patient and/or family. Comments or changes to the note/plan are indicated below.  HPI: 53 year old male with a history of WPW s/p ablation in 2015, AF, polysubstance abuse, noncompliance for whom cardiology is consulted for atrial fibrillation.   Currently sleeping   My Exam:  Physical Exam Vitals and nursing note reviewed.  Constitutional:      General: He is not in acute distress.    Appearance: He is not ill-appearing.     Comments: sleeping  Cardiovascular:  Rate and Rhythm: Tachycardia present.  Skin:    Coloration: Skin is not jaundiced.       Assessment & Plan:  Atrial fibrillation with RVR - continue plan as above. Unfortunately he is a poor candidate for cardioversion as he is not compliant with his anticoagulation and this increases his stroke risk. Will pursue rate control for now.  WPW s/p ablation in 2015, without evidence of recurrence on ECG  Polysubstance abuse GERD Anemia Asthma   Time coordinating patient care: 35 minutes  Signed, Emeline Calender, DO [place]  [facility]  04/17/2024 12:09 PM

## 2024-04-17 NOTE — Progress Notes (Signed)
°  Echocardiogram 2D Echocardiogram has been performed.  Norleen ORN [facility] 04/17/2024, 9:38 AM

## 2024-04-17 NOTE — Assessment & Plan Note (Signed)
 History of seizures from alcohol  withdrawal will monitor for any evidence of alcohol  withdrawal now

## 2024-04-17 NOTE — Assessment & Plan Note (Signed)
 Obtain anemia panel  Transfuse for Hg <7 , rapidly dropping or  if symptomatic

## 2024-04-17 NOTE — Progress Notes (Signed)
 Patient is physically and verbally aggressive at this time. Making racial comments toward the staff. He ripped the cord from the telemetry monitor. He is unsteady on his feet and very agitated that the bed and chair alarm are in use. Attending made aware of patient behavior.

## 2024-04-18 LAB — BASIC METABOLIC PANEL WITH GFR
Anion gap: 7 (ref 5–15)
BUN: 10 mg/dL (ref 6–20)
CO2: 23 mmol/L (ref 22–32)
Calcium: 7.8 mg/dL — ABNORMAL LOW (ref 8.9–10.3)
Chloride: 105 mmol/L (ref 98–111)
Creatinine, Ser: 0.61 mg/dL (ref 0.61–1.24)
GFR, Estimated: >60 mL/min (ref >60)
Glucose, Bld: 127 mg/dL — ABNORMAL HIGH (ref 70–99)
Potassium: 3.9 mmol/L (ref 3.5–5.1)
Sodium: 135 mmol/L (ref 135–145)

## 2024-04-18 LAB — CBC
HCT: 29.6 % — ABNORMAL LOW (ref 39.0–52.0)
Hemoglobin: 9.5 g/dL — ABNORMAL LOW (ref 13.0–17.0)
MCH: 30.2 pg (ref 26.0–34.0)
MCHC: 32.1 g/dL (ref 30.0–36.0)
MCV: 94 fL (ref 80.0–100.0)
Platelets: 165 K/uL (ref 150–400)
RBC: 3.15 MIL/uL — ABNORMAL LOW (ref 4.22–5.81)
RDW: 18.3 % — ABNORMAL HIGH (ref 11.5–15.5)
WBC: 7 K/uL (ref 4.0–10.5)
nRBC: 0.3 % — ABNORMAL HIGH (ref 0.0–0.2)

## 2024-04-18 LAB — MAGNESIUM
Magnesium: 1.3 mg/dL — ABNORMAL LOW (ref 1.7–2.4)
Magnesium: 1.7 mg/dL (ref 1.7–2.4)

## 2024-04-18 LAB — T3: T3, Total: 134 ng/dL (ref 71–180)

## 2024-04-18 MED ORDER — HYDROMORPHONE HCL 1 MG/ML IJ SOLN
0.5000 mg | Freq: Once | INTRAMUSCULAR | Status: AC
  Administered 2024-04-18: 0.5 mg via INTRAVENOUS
  Filled 2024-04-18: qty 1

## 2024-04-18 MED ORDER — CARVEDILOL 12.5 MG PO TABS
12.5000 mg | ORAL_TABLET | Freq: Two times a day (BID) | ORAL | Status: DC
  Administered 2024-04-18 – 2024-04-19 (×3): 12.5 mg via ORAL
  Filled 2024-04-18 (×3): qty 1

## 2024-04-18 MED ORDER — MAGNESIUM SULFATE 4 GM/100ML IV SOLN
4.0000 g | Freq: Once | INTRAVENOUS | Status: AC
  Administered 2024-04-18: 4 g via INTRAVENOUS
  Filled 2024-04-18: qty 100

## 2024-04-18 NOTE — Plan of Care (Signed)

## 2024-04-18 NOTE — Progress Notes (Signed)
 PROGRESS NOTE    [name]  FMW:[id number] DOB: [birth year] DOA: 04/16/2024 PCP: Myra Wanda LABOR, MD   Brief Narrative:  [name] is a 53 y.o. male with medical history significant of chronic pancreatitis,, Wolf Parkinson White syndrome A-fib, peptic ulcer disease, asthma, seizure-like activity, alcohol  abuse, polysubstance abuse, anemia, hypertension, bipolar, tobacco abuse, chronic diastolic chf presented to ED with shortness of breath and chest pain which started few hours before presenting to ED.  Upon arrival to ED, he was found to be in A-fib with RVR with rates up to 200s he was given 20 of diltiazem  which brought heart rate down to 170.  Reportedly he is supposed to be taking Coreg  and diltiazem  but ran out of his diltiazem  and has been taking his old metoprolol  instead. Last time cocaine use was 5 days ago. He continues to drink and last alcoholic drink was yesterday. He drinks between 7-8 beers a day  Assessment & Plan:   Principal Problem:   Atrial fibrillation with RVR (HCC) Active Problems:   Paroxysmal atrial fibrillation (HCC)   Alcohol  abuse   Tobacco abuse   Chronic alcoholic pancreatitis (HCC)   Normocytic anemia   GERD (gastroesophageal reflux disease)   Seizure (HCC)   Hypomagnesemia   WPW (Wolff-Parkinson-White syndrome)   Asthma, chronic   Cocaine abuse (HCC)   Abdominal pain  History of WPW syndrome status post ablation now with A-fib with RVR, POA: Patient was started on Cardizem  drip, converted to sinus rhythm, this was stopped.  Patient remains on Xarelto .  He has now been started on Coreg  12.5 mg p.o. twice daily.  Cardiology managing.  Alcohol  abuse/dependence: At risk of withdrawal, continue CIWA protocol.  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  Nursing have been documented CIWA up to 18 but that is due to his behavior and not true CIWA.  Continue CIWA protocol.  Chronic abdominal pain due to chronic  pancreatitis: Noted.  Continue Creon .  Asthma/emphysema: CT chest ruled out PE but confirms emphysema and he already has history of asthma.  Continue as needed Xopenex .  Polysubstance abuse: UDS positive for THC as well as cocaine, patient has been counseled to quit multiple times previously and again by myself.  GERD: Patient has chronic gastric wall thickening per CT scan.  Continue Protonix .  Hypomagnesemia: Replenished yesterday, very low again today, replenishing again today.  Monitor on telemetry.  Chronic iron deficiency anemia: Hemoglobin stable.  Monitor.  Tobacco abuse: I have discussed tobacco cessation with the patient.  I have counseled the patient regarding the negative impacts of continued tobacco use including but not limited to lung cancer, COPD, and cardiovascular disease.  I have discussed alternatives to tobacco and modalities that may help facilitate tobacco cessation including but not limited to biofeedback, hypnosis, and medications.  Total time spent with tobacco counseling was 5 minutes.  Odd behavior/?  Schizophrenia?  Paranoid: Multiple reports of patient being very belligerent, aggressive towards staff, pulling lines, pulling monitor, throwing stuff at staff.  Most of the staff feels uncomfortable and unsafe when caring for this patient and patient does not allow them to care either.  However he was redirectable with me when I saw him.  He is still complains of dizziness and chest pain.  Patient is known to have those behaviors in the past.  I highly suspect some sort of psychiatric disorder, I have consulted psychiatry to help with the situation.   DVT prophylaxis: Heparin    Code  Status: Full Code  Family Communication:  None present at bedside.  Plan of care discussed with patient in length and he/she verbalized understanding and agreed with it.  Status is: Inpatient Remains inpatient appropriate because: Still with rapid heart rate and severe  hypomagnesemia     Estimated body mass index is 17.94 kg/m as calculated from the following:   Height as of this encounter: 5' 8 (1.727 m).   Weight as of this encounter: 53.5 kg.    Nutritional Assessment: Body mass index is 17.94 kg/m.SABRA Seen by dietician.  I agree with the assessment and plan as outlined below: Nutrition Status:        . Skin Assessment: I have examined the patient's skin and I agree with the wound assessment as performed by the wound care RN as outlined below:    Consultants:  Cardiology  Procedures:  As above  Antimicrobials:  Anti-infectives (From admission, onward)    None         Subjective: Patient seen and examined, he was redirectable with me.  He complained of chest pain and shortness of breath.  Appeared very comfortable though.  However when I entered the room, instead of sitting in the chair or in the bed, patient was sitting on the corner table.  His rates were around 115-120 but appears to be in sinus rhythm.  I have notified cardiology.  Objective: Vitals:   04/18/24 0800 04/18/24 0917 04/18/24 0934 04/18/24 1217  BP: (!) 139/103  (!) 147/101 (!) 137/96  Pulse: 94 (!) 113  (!) 113  Resp: 18   18  Temp: (!) 97.4 F (36.3 C)   (!) 97.5 F (36.4 C)  TempSrc: Oral   Axillary  SpO2: 100%   100%  Weight:      Height:        Intake/Output Summary (Last 24 hours) at 04/18/2024 1237 Last data filed at 04/18/2024 0915 Gross per 24 hour  Intake 363 ml  Output --  Net 363 ml   Filed Weights   04/16/24 1634  Weight: 53.5 kg    Examination:  General exam: Appears calm and comfortable  Respiratory system: Clear to auscultation. Respiratory effort normal. Cardiovascular system: S1 & S2 heard, sinus tachycardia. No JVD, murmurs, rubs, gallops or clicks. No pedal edema. Gastrointestinal system: Abdomen is nondistended, soft and nontender. No organomegaly or masses felt. Normal bowel sounds heard. Central nervous system:  Alert and oriented. No focal neurological deficits. Extremities: Symmetric 5 x 5 power. Skin: No rashes, lesions or ulcers.    Data Reviewed: I have personally reviewed following labs and imaging studies  CBC: Recent Labs  Lab 04/16/24 1643 04/17/24 0022 04/17/24 0502 04/18/24 0439  WBC 9.3 8.9 8.5 7.0  HGB 11.5* 10.0* 10.5* 9.5*  HCT 35.6* 30.5* 32.8* 29.6*  MCV 94.9 93.6 94.8 94.0  PLT 196 183 180 165   Basic Metabolic Panel: Recent Labs  Lab 04/16/24 1643 04/17/24 0022 04/17/24 0502 04/17/24 1357 04/18/24 0439  NA 138  --  137  --  135  K 4.0  --  3.7  --  3.9  CL 110  --  105  --  105  CO2 17*  --  21*  --  23  GLUCOSE 188*  --  120*  --  127*  BUN 6  --  5*  --  10  CREATININE 0.96  --  1.01  --  0.61  CALCIUM  7.3*  --  7.7*  --  7.8*  MG 1.1* 1.5* 1.5* 2.0 1.3*  PHOS  --  4.0 3.9  --   --    GFR: Estimated Creatinine Clearance: 79.9 mL/min (by C-G formula based on SCr of 0.61 mg/dL). Liver Function Tests: Recent Labs  Lab 04/16/24 1844 04/17/24 0502  AST 37 28  ALT 23 23  ALKPHOS 69 72  BILITOT 0.3 0.9  PROT 6.3* 6.6  ALBUMIN  3.0* 3.3*   Recent Labs  Lab 04/16/24 1844  LIPASE 21   No results for input(s): AMMONIA in the last 168 hours. Coagulation Profile: No results for input(s): INR, PROTIME in the last 168 hours. Cardiac Enzymes: Recent Labs  Lab 04/17/24 0022  CKTOTAL 125   BNP (last 3 results) No results for input(s): PROBNP in the last 8760 hours. HbA1C: No results for input(s): HGBA1C in the last 72 hours. CBG: No results for input(s): GLUCAP in the last 168 hours. Lipid Profile: No results for input(s): CHOL, HDL, LDLCALC, TRIG, CHOLHDL, LDLDIRECT in the last 72 hours. Thyroid  Function Tests: Recent Labs    04/16/24 1644 04/17/24 0022  TSH 0.815  --   FREET4  --  0.69   Anemia Panel: Recent Labs    04/17/24 0022  VITAMINB12 342  FOLATE 12.8  FERRITIN 14*  TIBC 423  IRON 68  RETICCTPCT 2.3    Sepsis Labs: Recent Labs  Lab 04/17/24 0022 04/17/24 0502  LATICACIDVEN 1.6 1.6    No results found for this or any previous visit (from the past 240 hours).   Radiology Studies: ECHOCARDIOGRAM COMPLETE Result Date: 04/17/2024    ECHOCARDIOGRAM REPORT   Patient Name:   [name] Date of Exam: 04/17/2024 Medical Rec #:  [id number]              Height:       68.0 in Accession #:    [id number]             Weight:       118.0 lb Date of Birth:  11-10-1970               BSA:          1.633 m Patient Age:    53 years               BP:           92/68 mmHg Patient Gender: M                      HR:           87 bpm. Exam Location:  Inpatient Procedure: 2D Echo (Both Spectral and Color Flow Doppler were utilized during            procedure). Indications:    CP  History:        Patient has prior history of Echocardiogram examinations.                 Signs/Symptoms:Chest Pain.  Sonographer:    Norleen Amour Referring Phys: ANASTASSIA DOUTOVA IMPRESSIONS  1. Left ventricular ejection fraction, by estimation, is 70 to 75%. The left ventricle has hyperdynamic function. The left ventricle has no regional wall motion abnormalities. Indeterminate diastolic filling due to E-A fusion.  2. Right ventricular systolic function is normal. The right ventricular size is normal. There is normal pulmonary artery systolic pressure. The estimated right ventricular systolic pressure is 26.0 mmHg.  3. The mitral valve is normal in structure. No evidence of mitral valve  regurgitation. No evidence of mitral stenosis.  4. The aortic valve is tricuspid. Aortic valve regurgitation is not visualized. No aortic stenosis is present.  5. The inferior vena cava is normal in size with greater than 50% respiratory variability, suggesting right atrial pressure of 3 mmHg. Comparison(s): No significant change from prior study. Prior images reviewed side by side. FINDINGS  Left Ventricle: Left ventricular ejection fraction, by  estimation, is 70 to 75%. The left ventricle has hyperdynamic function. The left ventricle has no regional wall motion abnormalities. The left ventricular internal cavity size was normal in size. There is no left ventricular hypertrophy. Indeterminate diastolic filling due to E-A fusion. Right Ventricle: The right ventricular size is normal. No increase in right ventricular wall thickness. Right ventricular systolic function is normal. There is normal pulmonary artery systolic pressure. The tricuspid regurgitant velocity is 2.40 m/s, and  with an assumed right atrial pressure of 3 mmHg, the estimated right ventricular systolic pressure is 26.0 mmHg. Left Atrium: Left atrial size was normal in size. Right Atrium: Right atrial size was normal in size. Pericardium: There is no evidence of pericardial effusion. Mitral Valve: The mitral valve is normal in structure. No evidence of mitral valve regurgitation. No evidence of mitral valve stenosis. MV peak gradient, 3.2 mmHg. The mean mitral valve gradient is 1.0 mmHg. Tricuspid Valve: The tricuspid valve is normal in structure. Tricuspid valve regurgitation is trivial. No evidence of tricuspid stenosis. Aortic Valve: The aortic valve is tricuspid. Aortic valve regurgitation is not visualized. No aortic stenosis is present. Aortic valve mean gradient measures 3.0 mmHg. Aortic valve peak gradient measures 6.2 mmHg. Aortic valve area, by VTI measures 2.75 cm. Pulmonic Valve: The pulmonic valve was normal in structure. Pulmonic valve regurgitation is not visualized. No evidence of pulmonic stenosis. Aorta: The aortic root is normal in size and structure. Venous: The inferior vena cava is normal in size with greater than 50% respiratory variability, suggesting right atrial pressure of 3 mmHg. IAS/Shunts: No atrial level shunt detected by color flow Doppler.  LEFT VENTRICLE PLAX 2D LVIDd:         4.20 cm     Diastology LVIDs:         2.90 cm     LV e' medial:    10.83 cm/s LV  PW:         1.00 cm     LV E/e' medial:  9.8 LV IVS:        0.70 cm     LV e' lateral:   11.00 cm/s LVOT diam:     1.90 cm     LV E/e' lateral: 9.6 LV SV:         46 LV SV Index:   28 LVOT Area:     2.84 cm  LV Volumes (MOD) LV vol d, MOD A2C: 33.3 ml LV vol d, MOD A4C: 33.9 ml LV vol s, MOD A2C: 16.6 ml LV vol s, MOD A4C: 13.4 ml LV SV MOD A2C:     16.7 ml LV SV MOD A4C:     33.9 ml LV SV MOD BP:      17.7 ml RIGHT VENTRICLE            IVC RV Basal diam:  2.80 cm    IVC diam: 1.40 cm RV S prime:     8.16 cm/s TAPSE (M-mode): 1.5 cm LEFT ATRIUM           Index  RIGHT ATRIUM           Index LA diam:      3.10 cm 1.90 cm/m   RA Area:     11.00 cm LA Vol (A2C): 16.2 ml 9.92 ml/m   RA Volume:   24.50 ml  15.00 ml/m LA Vol (A4C): 26.0 ml 15.92 ml/m  AORTIC VALVE                    PULMONIC VALVE AV Area (Vmax):    2.08 cm     PV Vmax:       1.07 m/s AV Area (Vmean):   2.12 cm     PV Peak grad:  4.6 mmHg AV Area (VTI):     2.75 cm AV Vmax:           125.00 cm/s AV Vmean:          83.800 cm/s AV VTI:            0.168 m AV Peak Grad:      6.2 mmHg AV Mean Grad:      3.0 mmHg LVOT Vmax:         91.80 cm/s LVOT Vmean:        62.600 cm/s LVOT VTI:          0.163 m LVOT/AV VTI ratio: 0.97  AORTA Ao Root diam: 2.60 cm Ao Asc diam:  2.40 cm MITRAL VALVE                TRICUSPID VALVE MV Area VTI:  2.69 cm      TR Peak grad:   23.0 mmHg MV Peak grad: 3.2 mmHg      TR Vmax:        240.00 cm/s MV Mean grad: 1.0 mmHg MV Vmax:      0.90 m/s      SHUNTS MV Vmean:     45.3 cm/s     Systemic VTI:  0.16 m MV E velocity: 105.67 cm/s  Systemic Diam: 1.90 cm Oneil Parchment MD Electronically signed by Oneil Parchment MD Signature Date/Time: 04/17/2024/11:37:06 AM    Final    CT CHEST ABDOMEN PELVIS WO CONTRAST Result Date: 04/16/2024 CLINICAL DATA:  Unintended weight loss, history of chest pain EXAM: CT CHEST, ABDOMEN AND PELVIS WITHOUT CONTRAST TECHNIQUE: Multidetector CT imaging of the chest, abdomen and pelvis was performed  following the standard protocol without IV contrast. RADIATION DOSE REDUCTION: This exam was performed according to the departmental dose-optimization program which includes automated exposure control, adjustment of the mA and/or kV according to patient size and/or use of iterative reconstruction technique. COMPARISON:  04/16/2024, 03/26/2024 FINDINGS: CT CHEST FINDINGS Cardiovascular: Unenhanced imaging the heart is unremarkable without pericardial effusion. Normal caliber of the thoracic aorta. Mediastinum/Nodes: No enlarged mediastinal, hilar, or axillary lymph nodes. Thyroid  gland, trachea, and esophagus demonstrate no significant findings. Lungs/Pleura: Biapical emphysematous changes. No acute airspace disease, effusion, or pneumothorax. Central airways are patent. Musculoskeletal: No acute or destructive bony abnormalities. Reconstructed images demonstrate no additional findings. CT ABDOMEN PELVIS FINDINGS Hepatobiliary: Unremarkable unenhanced appearance of the liver and gallbladder. Pancreas: Stable 4.4 x 4.2 cm pseudocyst within the pancreatic body. No acute inflammatory changes. Calcifications within the pancreatic parenchyma consistent with sequela of chronic calcific pancreatitis. Spleen: Unremarkable unenhanced appearance. Adrenals/Urinary Tract: No urinary tract calculi or obstructive uropathy. Unremarkable appearance of the adrenals and bladder. Stomach/Bowel: No bowel obstruction or ileus. Normal appendix right mid abdomen. There is continued diffuse gastric wall thickening,  unchanged since numerous previous exams. Please correlate with previous upper endoscopy results. Vascular/Lymphatic: Aortic atherosclerosis. No enlarged abdominal or pelvic lymph nodes. Reproductive: Prostate is unremarkable. Other: No free fluid or free intraperitoneal gas. No abdominal wall hernia. Musculoskeletal: No acute or destructive bony abnormalities. Reconstructed images demonstrate no additional findings. IMPRESSION:  1. Chronic diffuse gastric wall thickening, unchanged since numerous previous exams. Please correlate with previous upper endoscopy results. 2. Sequela of chronic calcific pancreatitis, with stable 4.4 cm pseudocyst. No acute inflammatory changes. 3. No acute intra-abdominal scarred set no acute intrathoracic, intra-abdominal, or intrapelvic process. 4.  Aortic Atherosclerosis (ICD10-I70.0). 5. Pulmonary emphysema is present. Pulmonary emphysema is an independent risk factor for lung cancer. Recommend patient be considered for lung cancer screening. [place]  Chief Financial Officer. Screening for Lung Cancer: [place]  Licensed Conveyancer. JAMA. 2021;325(10):962-970. Electronically Signed   By: Ozell Daring M.D.   On: 04/16/2024 20:49   DG Chest Port 1 View Result Date: 04/16/2024 CLINICAL DATA:  Chest pain. EXAM: PORTABLE CHEST 1 VIEW COMPARISON:  04/02/2024. FINDINGS: The heart size and mediastinal contours are within normal limits. No focal consolidation, pleural effusion, or pneumothorax. No acute osseous abnormality. IMPRESSION: No acute cardiopulmonary findings. Electronically Signed   By: Harrietta Sherry M.D.   On: 04/16/2024 17:40    Scheduled Meds:  carvedilol   12.5 mg Oral BID WC   folic acid   1 mg Oral Daily   guaiFENesin   600 mg Oral BID   multivitamin with minerals  1 tablet Oral Daily   nicotine   14 mg Transdermal Daily   rivaroxaban   20 mg Oral Q supper   sodium chloride  flush  3 mL Intravenous Q12H   thiamine   100 mg Oral Daily   Or   thiamine   100 mg Intravenous Daily   Continuous Infusions:  sodium chloride  75 mL/hr at 04/17/24 1628     LOS: 1 day   Fredia Skeeter, MD [facility]  04/18/2024, 12:37 PM   *Please note that this is a verbal dictation therefore any spelling or grammatical errors are due to the Dragon Medical One system interpretation.  Please page via Amion and do not message via secure chat for urgent patient care  matters. Secure chat can be used for non urgent patient care matters.  How to contact the [facility] Attending or Consulting provider 7A - 7P or covering provider during after hours 7P -7A, for this patient?  Check the care team in [facility] and look for a) attending/consulting [facility] provider listed and b) the [facility] team listed. Page or secure chat 7A-7P. Log into [email or web address] and use [place]'s universal password to access. If you do not have the password, please contact the hospital operator. Locate the [facility] provider you are looking for under [facility] and page to a number that you can be directly reached. If you still have difficulty reaching the provider, please page the [facility] (Director on Call) for the Hospitalists listed on amion for assistance.

## 2024-04-18 NOTE — Progress Notes (Addendum)
°  Progress Note  Patient Name: [name] Date of Encounter: 04/18/2024 [facility] Cardiologist: Lynwood Schilling, MD   Interval Summary   Patient recently received Ativan , resting comfortably  Has been refusing most interventions and has been requiring Haldol  and Ativan  after having aggressive behavior towards staff  Remains in sinus  Vital Signs Vitals:   04/17/24 1945 04/17/24 2307 04/18/24 0348 04/18/24 0800  BP: 121/87 117/82 (!) 133/99   Pulse: 88 87 94 94  Resp: 18 18 18 18   Temp: 97.9 F (36.6 C) 97.8 F (36.6 C) 97.6 F (36.4 C) (!) 97.4 F (36.3 C)  TempSrc: Oral Oral Oral Oral  SpO2: 96% 96% 96% 100%  Weight:      Height:        Intake/Output Summary (Last 24 hours) at 04/18/2024 0820 Last data filed at 04/18/2024 0350 Gross per 24 hour  Intake 408.3 ml  Output --  Net 408.3 ml      04/16/2024    4:34 PM 04/02/2024    6:10 PM 03/26/2024   11:14 AM  Last 3 Weights  Weight (lbs) 118 lb 118 lb 118 lb  Weight (kg) 53.524 kg 53.524 kg 53.524 kg     Telemetry/ECG  Sinus tach, HR 100s - Personally Reviewed  Physical Exam  GEN: No acute distress.   Neck: No JVD Cardiac: RRR, no murmurs, rubs, or gallops.  Respiratory: Clear to auscultation bilaterally. GI: Soft, nontender, non-distended  MS: No edema  Assessment & Plan   Paroxysmal atrial fibrillation with RVR History of WPW s/p ablation  Home meds: diltiazem  240 mg daily, carvedilol  25 mg daily Reports he ran out of diltiazem  and coreg  and has been taking old metoprolol  instead  Does not appear to be on OAC, has been an issue with compliance and ETOH use History of polysubstance abuse UDS positive for cocaine and THC this admission Reports cocaine use 5 days ago Drinks 7-8 beers/day Suspect that A-Fib RVR was triggered by cocaine use  BP has been stable  Converted to NSR  Started on Xarelto  20 mg at bedtime  Continue to replete electrolytes, Mag > 2, K > 4 Consider adding  carvedilol  12.5 mg BID (previously on 25 mg BID)    Per primary Polysubstance abuse Chronic alcoholic pancreatitis  GERD Anemia Electrolyte disturbances Asthma History of seizures  For questions or updates, please contact [facility] Please consult [email or web address] for contact info under   Signed, Waddell DELENA Donath, PA-C    _____   Patient was not seen nor examined by myself as he put a chair behind the door of his room and was standing behind the door, thus preventing me from entering. When asked if I could enter, the patient did not respond. I decided not to enter for safety reasons and to avoid agitating him. I tried to close the door but he was holding the door open from behind the door. Nursing was notified.   I have reviewed the advanced provider note as above and agree with the plan as outlined.   Signed, Emeline Calender, DO [place]  [facility]  04/18/2024 9:57 AM

## 2024-04-18 NOTE — TOC Progression Note (Addendum)
 Transition of Care [facility]) - Progression Note    Patient Details  Name: [name] MRN: [id number] Date of Birth: [birth year]  [facility] CM/SW Contact  Isaiah Public, LCSWA Phone Number: 04/18/2024, 11:14 AM  Clinical Narrative:     CSW spoke with patient at bedside. Patient reports PTA he comes from home with mother. Patient reports is plan is to return home when ready for dc. Patient reports he will need transportation assistance when medically ready for dc. Patient confirmed he has PCP and uses [facility] pharmacy off of [address] CSW offered patient outpatient substance use treatment services resources. Patient accepted. All questions answered. No further questions reported at this time.  Update- MD informed patient that patient is interested in outpatient substance use treatment center. CSW followed up with patient. Patient plans on calling [facility] off [address] to schedule an appointment outpatient. Patient has resources with contact information.Patient informed CSW he does not plan on scheduling appointment for tomorrow. He plans on scheduling it for another day, but that he does plan on following up outpatient. CSW answered all questions. No further questions reported at this time. CSW informed MD.  Expected Discharge Plan: Home/Self Care Barriers to Discharge: Continued Medical Work up               Expected Discharge Plan and Services In-house Referral: Clinical Social Work     Living arrangements for the past 2 months: Single Family Home                                       Social Drivers of Health (SDOH) Interventions SDOH Screenings   Food Insecurity: No Food Insecurity (04/17/2024)  Housing: Low Risk (04/17/2024)  Transportation Needs: No Transportation Needs (04/17/2024)  Utilities: Not At Risk (04/17/2024)  Social Connections: Socially Isolated (11/10/2023)  Tobacco Use: High Risk (04/17/2024)    Readmission Risk  Interventions    06/19/2023    4:11 PM 03/07/2023    4:21 PM 01/10/2023    4:32 PM  Readmission Risk Prevention Plan  Transportation Screening Complete Complete Complete  Medication Review (RN Care Manager) Referral to Pharmacy Complete Complete  PCP or Specialist appointment within 3-5 days of discharge Complete Complete Complete  HRI or Home Care Consult Complete Complete Complete  SW Recovery Care/Counseling Consult Complete Complete Complete  Palliative Care Screening Not Applicable Not Applicable Not Applicable  Skilled Nursing Facility Not Applicable Not Applicable Not Applicable

## 2024-04-18 NOTE — Plan of Care (Signed)
   Problem: Education: Goal: Knowledge of General Education information will improve Description Including pain rating scale, medication(s)/side effects and non-pharmacologic comfort measures Outcome: Progressing
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
## 2024-04-19 ENCOUNTER — Other Ambulatory Visit (HOSPITAL_COMMUNITY): Payer: Self-pay

## 2024-04-19 LAB — BASIC METABOLIC PANEL WITH GFR
Anion gap: 7 (ref 5–15)
BUN: 10 mg/dL (ref 6–20)
CO2: 25 mmol/L (ref 22–32)
Calcium: 8.4 mg/dL — ABNORMAL LOW (ref 8.9–10.3)
Chloride: 103 mmol/L (ref 98–111)
Creatinine, Ser: 0.85 mg/dL (ref 0.61–1.24)
GFR, Estimated: >60 mL/min (ref >60)
Glucose, Bld: 93 mg/dL (ref 70–99)
Potassium: 4.3 mmol/L (ref 3.5–5.1)
Sodium: 135 mmol/L (ref 135–145)

## 2024-04-19 LAB — TROPONIN I (HIGH SENSITIVITY): Troponin I (High Sensitivity): 5 ng/L (ref <18)

## 2024-04-19 LAB — MAGNESIUM: Magnesium: 1.5 mg/dL — ABNORMAL LOW (ref 1.7–2.4)

## 2024-04-19 MED ORDER — CARVEDILOL 25 MG PO TABS
25.0000 mg | ORAL_TABLET | Freq: Two times a day (BID) | ORAL | 0 refills | Status: DC
  Filled 2024-04-19: qty 60, 30d supply, fill #0

## 2024-04-19 MED ORDER — CARVEDILOL 12.5 MG PO TABS
12.5000 mg | ORAL_TABLET | Freq: Once | ORAL | Status: AC
  Administered 2024-04-19: 12.5 mg via ORAL
  Filled 2024-04-19: qty 1

## 2024-04-19 MED ORDER — RIVAROXABAN 20 MG PO TABS
20.0000 mg | ORAL_TABLET | Freq: Every day | ORAL | 0 refills | Status: DC
  Filled 2024-04-19: qty 30, 30d supply, fill #0

## 2024-04-19 MED ORDER — TAMSULOSIN HCL 0.4 MG PO CAPS
0.4000 mg | ORAL_CAPSULE | Freq: Every day | ORAL | 0 refills | Status: AC
  Filled 2024-04-19: qty 30, 30d supply, fill #0

## 2024-04-19 MED ORDER — MAGNESIUM SULFATE 4 GM/100ML IV SOLN
4.0000 g | Freq: Once | INTRAVENOUS | Status: AC
  Administered 2024-04-19: 4 g via INTRAVENOUS
  Filled 2024-04-19: qty 100

## 2024-04-19 NOTE — TOC Progression Note (Signed)
 Transition of Care [facility]) - Progression Note    Patient Details  Name: [name] MRN: [id number] Date of Birth: 04-08-1971  Transition of Care [facility]) CM/SW Contact  Isaiah Public, LCSWA Phone Number: 04/19/2024, 10:51 AM  Clinical Narrative:     Patient informed CSW that he uses [employer] transportation. Patient agreeable for CSW to attach additional transportation resources to patients AVS. Patient plans to transport home by cab.  Expected Discharge Plan: Home/Self Care Barriers to Discharge: Continued Medical Work up               Expected Discharge Plan and Services In-house Referral: Clinical Social Work     Living arrangements for the past 2 months: Single Family Home Expected Discharge Date: 04/19/24                                     Social Drivers of Health (SDOH) Interventions SDOH Screenings   Food Insecurity: No Food Insecurity (04/17/2024)  Housing: Low Risk (04/17/2024)  Transportation Needs: No Transportation Needs (04/17/2024)  Utilities: Not At Risk (04/17/2024)  Social Connections: Socially Isolated (11/10/2023)  Tobacco Use: High Risk (04/17/2024)    Readmission Risk Interventions    06/19/2023    4:11 PM 03/07/2023    4:21 PM 01/10/2023    4:32 PM  Readmission Risk Prevention Plan  Transportation Screening Complete Complete Complete  Medication Review (RN Care Manager) Referral to Pharmacy Complete Complete  PCP or Specialist appointment within 3-5 days of discharge Complete Complete Complete  HRI or Home Care Consult Complete Complete Complete  SW Recovery Care/Counseling Consult Complete Complete Complete  Palliative Care Screening Not Applicable Not Applicable Not Applicable  Skilled Nursing Facility Not Applicable Not Applicable Not Applicable

## 2024-04-19 NOTE — Plan of Care (Signed)

## 2024-04-19 NOTE — Discharge Instructions (Signed)
 Transportation Resources  [employer] Information Phone: [phone] Website: buyingshow.es Location [address] [place], [place] [address] Eligibility Residents within the [place] Hours of Operation Monday- Friday (6am-8pm) Saturday (7am-7pm) Cost/Fees None  Referral No Referral Needed  [facility]) Services 24/7 Employment Immunologist Information Phone: [phone] Email: [email or web address] .gov Website: diymakeover.com.ee /human-services/transportation Location [address] [place], [place] [address] Eligibility Services are primarily available to residents of [place] who do not have access to [employer]  ([place]) or [employer]. Hours of Operation Transportation services for employment are available 24 hours a day, 7 days a week. Trips must be reserved by noon  on the business day before the trip and are provided as part of a shared ride service. Cost/Fees One-way Fee 2.50 Referral No Referral Needed  [facility] Non-Emergency Production Assistant, Radio Information Phone: [phone] Email: [email or web address] .[place] Website: diymakeover.[email or web address].ee /human-services/transportation Location [address] [place], [place] [address] Eligibility Residents of [place] who do not have access to [employer] or [employer]. Hours of Operation Transportation services operate Monday through Friday. Trips must be reserved by noon on the business day before  the trip Cost/Fees Contact Directly  Referral No Referral Needed  [facility]  ContactInformation Phone: [phone] Website:  [email or web address].gov/departments/transit Location [address] [place], [place] [address] Eligibility Fixed-Route Services: Available to all individuals without restrictions. Hours of Operation Fixed-Route Buses: Monday through Saturday, with varying hours depending on the specific route. Access [place]: Monday through Friday, 5:00 AM to 11:30 PM; Saturday, 6:00 AM to 10:00 PM. Not operational on  Thanksgiving, Christmas Day, or New Years Day. Cost/Fees Fixed-Route Buses: 1.00 per ride; discounted fares are available for seniors, persons with disabilities, and Medicare  recipients. Referral No Referral Needed

## 2024-04-19 NOTE — Discharge Summary (Signed)
 Physician Discharge Summary  [name] FMW:[id number] DOB: 05/30/1970 DOA: 04/16/2024  PCP: Myra Wanda LABOR, MD  Admit date: 04/16/2024 Discharge date: 04/19/2024 30 Day Unplanned Readmission Risk Score    Flowsheet Row ED to Hosp-Admission (Current) from 04/16/2024 in [facility]  30 Day Unplanned Readmission Risk Score (%) 90.44 Filed at 04/19/2024 0801    This score is the patient's risk of an unplanned readmission within 30 days of being discharged (0 -100%). The score is based on dignosis, age, lab data, medications, orders, and past utilization.   Low:  0-14.9   Medium: 15-21.9   High: 22-29.9   Extreme: 30 and above          Admitted From: Home Disposition: Home  Recommendations for Outpatient Follow-up:  Follow up with PCP in 1-2 weeks Please obtain BMP/CBC in one week Follow-up with cardiology in 1 month Please follow up with your PCP on the following pending results: Unresulted Labs (From admission, onward)    None         Home Health: None none Equipment/Devices: None   Discharge Condition: Stable CODE STATUS: Full code Diet recommendation:  Diet Order             Diet Heart Room service appropriate? Yes; Fluid consistency: Thin  Diet effective now                   Subjective: Seen and examined.  Other than chronic abdominal pain, he has no complaints.  No nausea, tolerating diet.  Plan of discharge discussed with him.  Compliance to medication and follow-ups reinforced and counseled once again in length.  Brief/Interim Summary: [name] is a 54 y.o. male with medical history significant of chronic pancreatitis,, Wolf Parkinson White syndrome A-fib, peptic ulcer disease, asthma, seizure-like activity, alcohol  abuse, polysubstance abuse, anemia, hypertension, bipolar, tobacco abuse, chronic diastolic chf presented to ED with shortness of breath and chest pain which started few hours before presenting to  ED.   Upon arrival to ED, he was found to be in A-fib with RVR with rates up to 200s he was given 20 of diltiazem  which brought heart rate down to 170.  Reportedly he is supposed to be taking Coreg  and diltiazem  but ran out of his diltiazem  and has been taking his old metoprolol  instead. Last time cocaine use was 5 days ago. He continues to drink and last alcoholic drink was yesterday. He drinks between 7-8 beers a day.  Details of hospitalization below.   History of WPW syndrome status post ablation now with A-fib with RVR, POA: Patient was started on Cardizem  drip, converted to sinus rhythm, this was stopped.  Patient remains on Xarelto .  He was started on Coreg  12.5 mg p.o. twice daily, heart rate improved but still slightly elevated as well as blood pressure slight elevated so cardiology recommended resuming back on 25 mg p.o. twice daily which has been recommended to him many times before but he never takes those medications once he is out of the hospital and remains noncompliant.  Compliance has been encouraged once again.  Xarelto  and Coreg  both prescribed to the pharmacy.   Alcohol  abuse/dependence: At risk of withdrawal, continue CIWA protocol.  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  Patient did have aggressive behavior in order behavior which was presumed to be psychiatrically but perhaps that was his alcohol  withdrawal, he has been compliant since yesterday with low CIWA score.   Chronic abdominal  pain due to chronic pancreatitis: Noted.  Continue Creon .  Patient was demanding to prescribe him pain medications, I explained to him that based on the CT scan, he has chronic pancreatitis and I do not have any medical reason to prescribe opioids.   Asthma/emphysema: CT chest ruled out PE but confirms emphysema and he already has history of asthma.  Continue as needed Xopenex .   Polysubstance abuse: UDS positive for THC as well as cocaine, patient has been counseled to  quit multiple times previously and again by myself.   GERD: Patient has chronic gastric wall thickening per CT scan.  Continue Protonix .   Hypomagnesemia: Resolved.   Chronic iron deficiency anemia: Hemoglobin stable.  Monitor.   Tobacco abuse: I have discussed tobacco cessation with the patient.  I have counseled the patient regarding the negative impacts of continued tobacco use including but not limited to lung cancer, COPD, and cardiovascular disease.  I have discussed alternatives to tobacco and modalities that may help facilitate tobacco cessation including but not limited to biofeedback, hypnosis, and medications.  Total time spent with tobacco counseling was 5 minutes.   Odd behavior/?  Schizophrenia?  Paranoid: Multiple reports of patient being very belligerent, aggressive towards staff, pulling lines, pulling monitor, throwing stuff at staff.  Most of the staff feels uncomfortable and unsafe when caring for this patient and patient does not allow them to care either.  Psychiatry was consulted, patient turned the corner, behaved very well and psychiatry did not feel he has any psychiatric disease and they cleared him for discharge.  Chest pain: Patient continues to complain of chest pain but his troponins were negative and echo ruled out wall motion abnormality..  I tried calling his mother yesterday as well as today with no answer.  Discharge Diagnoses:  Principal Problem:   Atrial fibrillation with RVR (HCC) Active Problems:   Paroxysmal atrial fibrillation (HCC)   Alcohol  abuse   Tobacco abuse   Chronic alcoholic pancreatitis (HCC)   Normocytic anemia   GERD (gastroesophageal reflux disease)   Seizure (HCC)   Hypomagnesemia   WPW (Wolff-Parkinson-White syndrome)   Asthma, chronic   Cocaine abuse (HCC)   Abdominal pain    Discharge Instructions   Allergies as of 04/19/2024       Reactions   Robaxin  [methocarbamol ] Other (See Comments)   Myoclonus    Bayer Aspirin   [aspirin ] Other (See Comments)   Unknown reaction   Trazodone  And Nefazodone Other (See Comments)   Muscle spasms   Adhesive [tape] Itching   Latex Itching   Toradol  [ketorolac  Tromethamine ] Other (See Comments)   Hx ulcers   Contrast Media [iodinated Contrast Media] Hives   Fish Allergy Nausea And Vomiting, Rash   Reglan  [metoclopramide ] Other (See Comments)   Muscle spasms   Salmon [fish Oil] Nausea And Vomiting, Rash   Shellfish Allergy Nausea And Vomiting, Rash        Medication List     STOP taking these medications    diltiazem  240 MG 24 hr capsule Commonly known as: CARDIZEM  CD   ferrous sulfate  325 (65 FE) MG tablet       TAKE these medications    acetaminophen  500 MG tablet Commonly known as: TYLENOL  Take 1,000 mg by mouth daily as needed for mild pain (pain score 1-3) or moderate pain (pain score 4-6).   albuterol  108 (90 Base) MCG/ACT inhaler Commonly known as: VENTOLIN  HFA Inhale 2 puffs into the lungs every 6 (six) hours as needed for wheezing  or shortness of breath.   calcium  carbonate 500 MG chewable tablet Commonly known as: TUMS - dosed in mg elemental calcium  Chew 2 tablets (400 mg of elemental calcium  total) by mouth 3 (three) times daily as needed for indigestion or heartburn.   carvedilol  25 MG tablet Commonly known as: COREG  Take 1 tablet (25 mg total) by mouth 2 (two) times daily with a meal.   CertaVite/Antioxidants Tabs Take 1 tablet by mouth daily.   cyclobenzaprine  10 MG tablet Commonly known as: FLEXERIL  Take 1 tablet (10 mg total) by mouth 3 (three) times daily as needed for muscle spasms.   famotidine  40 MG tablet Commonly known as: PEPCID  Take 1 tablet (40 mg total) by mouth daily. What changed: when to take this   feeding supplement Liqd Take 237 mLs by mouth 3 (three) times daily between meals.   fluticasone  50 MCG/ACT nasal spray Commonly known as: FLONASE  Place 2 sprays into both nostrils daily.   folic acid  1 MG  tablet Commonly known as: FOLVITE  Take 1 tablet (1 mg total) by mouth daily.   lipase/protease/amylase 12000-38000 units Cpep capsule Commonly known as: CREON  Take 2 capsules (24,000 units of lipase total) by mouth Three (3) times a day with a meal. May also take 1 capsule (12,000 units of lipase total) with snacks (up to 3 snacks daily).   loperamide  2 MG tablet Commonly known as: IMODIUM  A-D Take 2 mg by mouth 4 (four) times daily as needed for diarrhea or loose stools.   magnesium  oxide 400 (240 Mg) MG tablet Commonly known as: MAG-OX Take 1 tablet (400 mg total) by mouth daily.   naloxone  4 MG/0.1ML Liqd nasal spray kit Commonly known as: Narcan  Place 1 spray into the nose daily as needed (Opioid overdose).   nicotine  21 mg/24hr patch Commonly known as: NICODERM CQ  - dosed in mg/24 hours Place 1 patch (21 mg total) onto the skin daily. What changed:  when to take this reasons to take this   nicotine  polacrilex 2 MG gum Commonly known as: NICORETTE  Take 1 each (2 mg total) by mouth as needed for smoking cessation.   ondansetron  4 MG disintegrating tablet Commonly known as: ZOFRAN -ODT Take 1 tablet (4 mg total) by mouth every 8 (eight) hours as needed.   Oxycodone  HCl 10 MG Tabs Take 10 mg by mouth 4 (four) times daily as needed.   pantoprazole  40 MG tablet Commonly known as: PROTONIX  Take 1 tablet (40 mg total) by mouth daily.   polyethylene glycol powder 17 GM/SCOOP powder Commonly known as: GLYCOLAX /MIRALAX  Take 17 g by mouth daily as needed for mild constipation.   promethazine  25 MG tablet Commonly known as: PHENERGAN  Take 1 tablet (25 mg total) by mouth every 8 (eight) hours as needed for up to 4 days for nausea or vomiting.   rivaroxaban  20 MG Tabs tablet Commonly known as: XARELTO  Take 1 tablet (20 mg total) by mouth daily with supper.   senna-docusate 8.6-50 MG tablet Commonly known as: Senokot-S Take 1 tablet by mouth at bedtime as needed for mild  constipation.   sucralfate  1 GM/10ML suspension Commonly known as: CARAFATE  Take 1 g by mouth 4 (four) times daily -  with meals and at bedtime.   tamsulosin  0.4 MG Caps capsule Commonly known as: FLOMAX  Take 1 capsule (0.4 mg total) by mouth daily.   thiamine  100 MG tablet Commonly known as: Vitamin B-1 Take 1 tablet (100 mg total) by mouth daily.  Follow-up Information     Myra Wanda LABOR, MD Follow up in 1 week(s).   Specialty: Family Medicine Contact information: [address] [place] [address] [phone]                Allergies[1]  Consultations: Cardiology   Procedures/Studies: ECHOCARDIOGRAM COMPLETE Result Date: 04/17/2024    ECHOCARDIOGRAM REPORT   Patient Name:   Teshaun Olarte Date of Exam: 04/17/2024 Medical Rec #:  [id number]              Height:       68.0 in Accession #:    [id number]             Weight:       118.0 lb Date of Birth:  1971-01-22               BSA:          1.633 m Patient Age:    53 years               BP:           92/68 mmHg Patient Gender: M                      HR:           87 bpm. Exam Location:  Inpatient Procedure: 2D Echo (Both Spectral and Color Flow Doppler were utilized during            procedure). Indications:    CP  History:        Patient has prior history of Echocardiogram examinations.                 Signs/Symptoms:Chest Pain.  Sonographer:    Norleen Amour Referring Phys: ANASTASSIA DOUTOVA IMPRESSIONS  1. Left ventricular ejection fraction, by estimation, is 70 to 75%. The left ventricle has hyperdynamic function. The left ventricle has no regional wall motion abnormalities. Indeterminate diastolic filling due to E-A fusion.  2. Right ventricular systolic function is normal. The right ventricular size is normal. There is normal pulmonary artery systolic pressure. The estimated right ventricular systolic pressure is 26.0 mmHg.  3. The mitral valve is normal in structure. No evidence of mitral valve  regurgitation. No evidence of mitral stenosis.  4. The aortic valve is tricuspid. Aortic valve regurgitation is not visualized. No aortic stenosis is present.  5. The inferior vena cava is normal in size with greater than 50% respiratory variability, suggesting right atrial pressure of 3 mmHg. Comparison(s): No significant change from prior study. Prior images reviewed side by side. FINDINGS  Left Ventricle: Left ventricular ejection fraction, by estimation, is 70 to 75%. The left ventricle has hyperdynamic function. The left ventricle has no regional wall motion abnormalities. The left ventricular internal cavity size was normal in size. There is no left ventricular hypertrophy. Indeterminate diastolic filling due to E-A fusion. Right Ventricle: The right ventricular size is normal. No increase in right ventricular wall thickness. Right ventricular systolic function is normal. There is normal pulmonary artery systolic pressure. The tricuspid regurgitant velocity is 2.40 m/s, and  with an assumed right atrial pressure of 3 mmHg, the estimated right ventricular systolic pressure is 26.0 mmHg. Left Atrium: Left atrial size was normal in size. Right Atrium: Right atrial size was normal in size. Pericardium: There is no evidence of pericardial effusion. Mitral Valve: The mitral valve is normal in structure. No evidence of mitral valve regurgitation. No evidence of  mitral valve stenosis. MV peak gradient, 3.2 mmHg. The mean mitral valve gradient is 1.0 mmHg. Tricuspid Valve: The tricuspid valve is normal in structure. Tricuspid valve regurgitation is trivial. No evidence of tricuspid stenosis. Aortic Valve: The aortic valve is tricuspid. Aortic valve regurgitation is not visualized. No aortic stenosis is present. Aortic valve mean gradient measures 3.0 mmHg. Aortic valve peak gradient measures 6.2 mmHg. Aortic valve area, by VTI measures 2.75 cm. Pulmonic Valve: The pulmonic valve was normal in structure. Pulmonic valve  regurgitation is not visualized. No evidence of pulmonic stenosis. Aorta: The aortic root is normal in size and structure. Venous: The inferior vena cava is normal in size with greater than 50% respiratory variability, suggesting right atrial pressure of 3 mmHg. IAS/Shunts: No atrial level shunt detected by color flow Doppler.  LEFT VENTRICLE PLAX 2D LVIDd:         4.20 cm     Diastology LVIDs:         2.90 cm     LV e' medial:    10.83 cm/s LV PW:         1.00 cm     LV E/e' medial:  9.8 LV IVS:        0.70 cm     LV e' lateral:   11.00 cm/s LVOT diam:     1.90 cm     LV E/e' lateral: 9.6 LV SV:         46 LV SV Index:   28 LVOT Area:     2.84 cm  LV Volumes (MOD) LV vol d, MOD A2C: 33.3 ml LV vol d, MOD A4C: 33.9 ml LV vol s, MOD A2C: 16.6 ml LV vol s, MOD A4C: 13.4 ml LV SV MOD A2C:     16.7 ml LV SV MOD A4C:     33.9 ml LV SV MOD BP:      17.7 ml RIGHT VENTRICLE            IVC RV Basal diam:  2.80 cm    IVC diam: 1.40 cm RV S prime:     8.16 cm/s TAPSE (M-mode): 1.5 cm LEFT ATRIUM           Index        RIGHT ATRIUM           Index LA diam:      3.10 cm 1.90 cm/m   RA Area:     11.00 cm LA Vol (A2C): 16.2 ml 9.92 ml/m   RA Volume:   24.50 ml  15.00 ml/m LA Vol (A4C): 26.0 ml 15.92 ml/m  AORTIC VALVE                    PULMONIC VALVE AV Area (Vmax):    2.08 cm     PV Vmax:       1.07 m/s AV Area (Vmean):   2.12 cm     PV Peak grad:  4.6 mmHg AV Area (VTI):     2.75 cm AV Vmax:           125.00 cm/s AV Vmean:          83.800 cm/s AV VTI:            0.168 m AV Peak Grad:      6.2 mmHg AV Mean Grad:      3.0 mmHg LVOT Vmax:         91.80 cm/s LVOT Vmean:  62.600 cm/s LVOT VTI:          0.163 m LVOT/AV VTI ratio: 0.97  AORTA Ao Root diam: 2.60 cm Ao Asc diam:  2.40 cm MITRAL VALVE                TRICUSPID VALVE MV Area VTI:  2.69 cm      TR Peak grad:   23.0 mmHg MV Peak grad: 3.2 mmHg      TR Vmax:        240.00 cm/s MV Mean grad: 1.0 mmHg MV Vmax:      0.90 m/s      SHUNTS MV Vmean:     45.3 cm/s      Systemic VTI:  0.16 m MV E velocity: 105.67 cm/s  Systemic Diam: 1.90 cm Oneil Parchment MD Electronically signed by Oneil Parchment MD Signature Date/Time: 04/17/2024/11:37:06 AM    Final    CT CHEST ABDOMEN PELVIS WO CONTRAST Result Date: 04/16/2024 CLINICAL DATA:  Unintended weight loss, history of chest pain EXAM: CT CHEST, ABDOMEN AND PELVIS WITHOUT CONTRAST TECHNIQUE: Multidetector CT imaging of the chest, abdomen and pelvis was performed following the standard protocol without IV contrast. RADIATION DOSE REDUCTION: This exam was performed according to the departmental dose-optimization program which includes automated exposure control, adjustment of the mA and/or kV according to patient size and/or use of iterative reconstruction technique. COMPARISON:  04/16/2024, 03/26/2024 FINDINGS: CT CHEST FINDINGS Cardiovascular: Unenhanced imaging the heart is unremarkable without pericardial effusion. Normal caliber of the thoracic aorta. Mediastinum/Nodes: No enlarged mediastinal, hilar, or axillary lymph nodes. Thyroid  gland, trachea, and esophagus demonstrate no significant findings. Lungs/Pleura: Biapical emphysematous changes. No acute airspace disease, effusion, or pneumothorax. Central airways are patent. Musculoskeletal: No acute or destructive bony abnormalities. Reconstructed images demonstrate no additional findings. CT ABDOMEN PELVIS FINDINGS Hepatobiliary: Unremarkable unenhanced appearance of the liver and gallbladder. Pancreas: Stable 4.4 x 4.2 cm pseudocyst within the pancreatic body. No acute inflammatory changes. Calcifications within the pancreatic parenchyma consistent with sequela of chronic calcific pancreatitis. Spleen: Unremarkable unenhanced appearance. Adrenals/Urinary Tract: No urinary tract calculi or obstructive uropathy. Unremarkable appearance of the adrenals and bladder. Stomach/Bowel: No bowel obstruction or ileus. Normal appendix right mid abdomen. There is continued diffuse gastric wall  thickening, unchanged since numerous previous exams. Please correlate with previous upper endoscopy results. Vascular/Lymphatic: Aortic atherosclerosis. No enlarged abdominal or pelvic lymph nodes. Reproductive: Prostate is unremarkable. Other: No free fluid or free intraperitoneal gas. No abdominal wall hernia. Musculoskeletal: No acute or destructive bony abnormalities. Reconstructed images demonstrate no additional findings. IMPRESSION: 1. Chronic diffuse gastric wall thickening, unchanged since numerous previous exams. Please correlate with previous upper endoscopy results. 2. Sequela of chronic calcific pancreatitis, with stable 4.4 cm pseudocyst. No acute inflammatory changes. 3. No acute intra-abdominal scarred set no acute intrathoracic, intra-abdominal, or intrapelvic process. 4.  Aortic Atherosclerosis (ICD10-I70.0). 5. Pulmonary emphysema is present. Pulmonary emphysema is an independent risk factor for lung cancer. Recommend patient be considered for lung cancer screening. [place]  Chief Financial Officer. Screening for Lung Cancer: [place]  Licensed Conveyancer. JAMA. 2021;325(10):962-970. Electronically Signed   By: Ozell Daring M.D.   On: 04/16/2024 20:49   DG Chest Port 1 View Result Date: 04/16/2024 CLINICAL DATA:  Chest pain. EXAM: PORTABLE CHEST 1 VIEW COMPARISON:  04/02/2024. FINDINGS: The heart size and mediastinal contours are within normal limits. No focal consolidation, pleural effusion, or pneumothorax. No acute osseous abnormality. IMPRESSION: No acute cardiopulmonary findings. Electronically Signed   By:  Harrietta Sherry M.D.   On: 04/16/2024 17:40   DG Chest 2 View Result Date: 04/02/2024 EXAM: 2 VIEW(S) XRAY OF THE CHEST 04/02/2024 08:18:21 PM COMPARISON: Comparison with 03/26/2024. CLINICAL HISTORY: CP. FINDINGS: LUNGS AND PLEURA: Emphysematous changes and scattered fibrosis in the lungs. No airspace disease or consolidation. No pleural effusion or  pneumothorax. HEART AND MEDIASTINUM: Heart size and pulmonary vascularity are normal. Mediastinal contours appear intact. No acute abnormality of the cardiac and mediastinal silhouettes. BONES AND SOFT TISSUES: No acute osseous abnormality. IMPRESSION: 1. No acute cardiopulmonary pathology. 2. Emphysematous changes and scattered pulmonary fibrosis, which may relate to the chest pain context. Electronically signed by: Elsie Gravely MD 04/02/2024 08:26 PM EST RP Workstation: [id number]   CT ABDOMEN PELVIS WO CONTRAST Result Date: 03/26/2024 CLINICAL DATA:  Abdominal pain.  Bloody stools. EXAM: CT ABDOMEN AND PELVIS WITHOUT CONTRAST TECHNIQUE: Multidetector CT imaging of the abdomen and pelvis was performed following the standard protocol without IV contrast. RADIATION DOSE REDUCTION: This exam was performed according to the departmental dose-optimization program which includes automated exposure control, adjustment of the mA and/or kV according to patient size and/or use of iterative reconstruction technique. COMPARISON:  CT abdomen pelvis dated 12/11/2023. FINDINGS: Evaluation of this exam is limited in the absence of intravenous contrast as well as due to respiratory motion. Lower chest: The visualized lung bases are clear. No intra-abdominal free air or free fluid. Hepatobiliary: The liver is unremarkable. No biliary dilatation. The gallbladder is unremarkable Pancreas: Coarse pancreatic calcifications sequela of chronic pancreatitis. A 4.5 x 3.9 cm cystic structure along the distal pancreas as seen previously most likely a pseudocyst. Attention on follow-up imaging recommended. Spleen: Normal in size without focal abnormality. Adrenals/Urinary Tract: The adrenal glands are unremarkable. There is no hydronephrosis or nephrolithiasis on either side. The visualized ureters and urinary bladder unremarkable. Stomach/Bowel: Evaluation of the bowel is limited in the absence of oral contrast. Diffuse thickened  appearance of the stomach as seen on the prior CT. Although this may be related to underdistention, underlying infiltrative process or gastritis is not excluded. Endoscopy may provide better evaluation. There is no bowel obstruction. The appendix is normal. Vascular/Lymphatic: Moderate aortoiliac atherosclerotic disease. The IVC is unremarkable. No portal gas. There is no adenopathy. Reproductive: The prostate and seminal vesicles are grossly unremarkable. No pelvic mass. Other: None Musculoskeletal: Degenerative changes at L5-S1. No acute osseous pathology. IMPRESSION: 1. No acute intra-abdominal or pelvic pathology. 2. Persistent diffuse thickened appearance of the stomach may be related to underdistention or represent an infiltrative/inflammatory process. Endoscopy may provide better evaluation. No bowel obstruction. Normal appendix. 3. Sequela of chronic pancreatitis and pseudocyst. Attention on follow-up imaging recommended. 4.  Aortic Atherosclerosis (ICD10-I70.0). Electronically Signed   By: Vanetta Chou M.D.   On: 03/26/2024 16:55   DG Chest 2 View Result Date: 03/26/2024 EXAM: 2 VIEW(S) XRAY OF THE CHEST 03/26/2024 11:48:00 AM COMPARISON: 02/20/2024 CLINICAL HISTORY: chest pain FINDINGS: LUNGS AND PLEURA: No focal pulmonary opacity. No pleural effusion. No pneumothorax. HEART AND MEDIASTINUM: No acute abnormality of the cardiac and mediastinal silhouettes. BONES AND SOFT TISSUES: No acute osseous abnormality. IMPRESSION: 1. No acute findings. Electronically signed by: Evalene Coho MD 03/26/2024 12:03 PM EST RP Workstation: [id number]     Discharge Exam: Vitals:   04/19/24 0724 04/19/24 0939  BP: (!) 125/106 (!) 129/97  Pulse: 91 (!) 101  Resp: 18   Temp: (!) 97.3 F (36.3 C)   SpO2: 99%    Vitals:   04/19/24 0416 04/19/24  0417 04/19/24 0724 04/19/24 0939  BP:  (!) 146/100 (!) 125/106 (!) 129/97  Pulse:  80 91 (!) 101  Resp: 17  18   Temp:   (!) 97.3 F (36.3 C)   TempSrc:    Oral   SpO2:   99%   Weight:      Height:        General: Pt is alert, awake, not in acute distress Cardiovascular: RRR, S1/S2 +, no rubs, no gallops Respiratory: CTA bilaterally, no wheezing, no rhonchi Abdominal: Soft, NT, ND, bowel sounds + Extremities: no edema, no cyanosis    The results of significant diagnostics from this hospitalization (including imaging, microbiology, ancillary and laboratory) are listed below for reference.     Microbiology: No results found for this or any previous visit (from the past 240 hours).   Labs: BNP (last 3 results) Recent Labs    08/13/23 1001 02/20/24 1431 04/16/24 1644  BNP 423.9* 100.3* 90.7   Basic Metabolic Panel: Recent Labs  Lab 04/16/24 1643 04/17/24 0022 04/17/24 0502 04/17/24 1357 04/18/24 0439 04/18/24 1842 04/19/24 0336  NA 138  --  137  --  135  --  135  K 4.0  --  3.7  --  3.9  --  4.3  CL 110  --  105  --  105  --  103  CO2 17*  --  21*  --  23  --  25  GLUCOSE 188*  --  120*  --  127*  --  93  BUN 6  --  5*  --  10  --  10  CREATININE 0.96  --  1.01  --  0.61  --  0.85  CALCIUM  7.3*  --  7.7*  --  7.8*  --  8.4*  MG 1.1* 1.5* 1.5* 2.0 1.3* 1.7 1.5*  PHOS  --  4.0 3.9  --   --   --   --    Liver Function Tests: Recent Labs  Lab 04/16/24 1844 04/17/24 0502  AST 37 28  ALT 23 23  ALKPHOS 69 72  BILITOT 0.3 0.9  PROT 6.3* 6.6  ALBUMIN  3.0* 3.3*   Recent Labs  Lab 04/16/24 1844  LIPASE 21   No results for input(s): AMMONIA in the last 168 hours. CBC: Recent Labs  Lab 04/16/24 1643 04/17/24 0022 04/17/24 0502 04/18/24 0439  WBC 9.3 8.9 8.5 7.0  HGB 11.5* 10.0* 10.5* 9.5*  HCT 35.6* 30.5* 32.8* 29.6*  MCV 94.9 93.6 94.8 94.0  PLT 196 183 180 165   Cardiac Enzymes: Recent Labs  Lab 04/17/24 0022  CKTOTAL 125   BNP: Invalid input(s): POCBNP CBG: No results for input(s): GLUCAP in the last 168 hours. D-Dimer No results for input(s): DDIMER in the last 72 hours. Hgb  A1c No results for input(s): HGBA1C in the last 72 hours. Lipid Profile No results for input(s): CHOL, HDL, LDLCALC, TRIG, CHOLHDL, LDLDIRECT in the last 72 hours. Thyroid  function studies Recent Labs    04/16/24 1644  TSH 0.815   Anemia work up Recent Labs    04/17/24 0022  VITAMINB12 342  FOLATE 12.8  FERRITIN 14*  TIBC 423  IRON 68  RETICCTPCT 2.3   Urinalysis    Component Value Date/Time   COLORURINE STRAW (A) 04/17/2024 0505   APPEARANCEUR CLEAR 04/17/2024 0505   LABSPEC 1.006 04/17/2024 0505   PHURINE 6.0 04/17/2024 0505   GLUCOSEU NEGATIVE 04/17/2024 0505   HGBUR NEGATIVE 04/17/2024 0505  HGBUR negative 04/30/2010 1020   BILIRUBINUR NEGATIVE 04/17/2024 0505   BILIRUBINUR negative 09/06/2019 1649   KETONESUR NEGATIVE 04/17/2024 0505   PROTEINUR NEGATIVE 04/17/2024 0505   UROBILINOGEN 0.2 09/06/2019 1649   UROBILINOGEN 0.2 03/15/2015 0508   NITRITE NEGATIVE 04/17/2024 0505   LEUKOCYTESUR NEGATIVE 04/17/2024 0505   Sepsis Labs Recent Labs  Lab 04/16/24 1643 04/17/24 0022 04/17/24 0502 04/18/24 0439  WBC 9.3 8.9 8.5 7.0   Microbiology No results found for this or any previous visit (from the past 240 hours).  FURTHER DISCHARGE INSTRUCTIONS:   Get Medicines reviewed and adjusted: Please take all your medications with you for your next visit with your Primary MD   Laboratory/radiological data: Please request your Primary MD to go over all hospital tests and procedure/radiological results at the follow up, please ask your Primary MD to get all Hospital records sent to his/her office.   In some cases, they will be blood work, cultures and biopsy results pending at the time of your discharge. Please request that your primary care M.D. goes through all the records of your hospital data and follows up on these results.   Also Note the following: If you experience worsening of your admission symptoms, develop shortness of breath, life  threatening emergency, suicidal or homicidal thoughts you must seek medical attention immediately by calling 911 or calling your MD immediately  if symptoms less severe.   You must read complete instructions/literature along with all the possible adverse reactions/side effects for all the Medicines you take and that have been prescribed to you. Take any new Medicines after you have completely understood and accpet all the possible adverse reactions/side effects.    patient was instructed, not to drive, operate heavy machinery, perform activities at heights, swimming or participation in water  activities or provide baby-sitting services while on Pain, Sleep and Anxiety Medications; until their outpatient Physician has advised to do so again. Also recommended to not to take more than prescribed Pain, Sleep and Anxiety Medications.  It is not advisable to combine anxiety, sleep and pain medications without talking with your primary care provider.     Wear Seat belts while driving.   Please note: You were cared for by a hospitalist during your hospital stay. Once you are discharged, your primary care physician will handle any further medical issues. Please note that NO REFILLS for any discharge medications will be authorized once you are discharged, as it is imperative that you return to your primary care physician (or establish a relationship with a primary care physician if you do not have one) for your post hospital discharge needs so that they can reassess your need for medications and monitor your lab values  Time coordinating discharge: Over 30 minutes  SIGNED:   Fredia Skeeter, MD  [facility] 04/19/2024, 11:09 AM *Please note that this is a verbal dictation therefore any spelling or grammatical errors are due to the Dragon Medical One system interpretation. If 7PM-7AM, please contact night-coverage [email or web address]     [1]  Allergies Allergen Reactions   Robaxin  [Methocarbamol ]  Other (See Comments)    Myoclonus    Bayer Aspirin  [Aspirin ] Other (See Comments)    Unknown reaction   Trazodone  And Nefazodone Other (See Comments)    Muscle spasms   Adhesive [Tape] Itching   Latex Itching   Toradol  [Ketorolac  Tromethamine ] Other (See Comments)    Hx ulcers   Contrast Media [Iodinated Contrast Media] Hives   Fish Allergy Nausea And Vomiting and Rash  Reglan  [Metoclopramide ] Other (See Comments)    Muscle spasms   Salmon [Fish Oil] Nausea And Vomiting and Rash   Shellfish Allergy Nausea And Vomiting and Rash

## 2024-04-19 NOTE — Progress Notes (Addendum)
 Progress Note  Patient Name: [name] Date of Encounter: 04/19/2024 [facility] Cardiologist: Lynwood Schilling, MD   Interval Summary   Resting in bed Says he feels about the same Told me he wants something stronger than fentanyl  for pain  Vital Signs Vitals:   04/19/24 0258 04/19/24 0416 04/19/24 0417 04/19/24 0724  BP:   (!) 146/100 (!) 125/106  Pulse: 80  80 91  Resp: 17 17  18   Temp:    (!) 97.3 F (36.3 C)  TempSrc:    Oral  SpO2: 100%   99%  Weight:      Height:        Intake/Output Summary (Last 24 hours) at 04/19/2024 0843 Last data filed at 04/18/2024 2352 Gross per 24 hour  Intake 1203 ml  Output --  Net 1203 ml      04/16/2024    4:34 PM 04/02/2024    6:10 PM 03/26/2024   11:14 AM  Last 3 Weights  Weight (lbs) 118 lb 118 lb 118 lb  Weight (kg) 53.524 kg 53.524 kg 53.524 kg      Telemetry/ECG  Sinus rhythm, HR 80-90s - Personally Reviewed  Physical Exam  GEN: No acute distress.   Neck: No JVD Cardiac: RRR, no murmurs, rubs, or gallops.  Respiratory: Clear to auscultation bilaterally. GI: Soft, nontender, non-distended  MS: No edema  Assessment & Plan   Paroxysmal atrial fibrillation with RVR History of WPW s/p ablation  Home meds: diltiazem  240 mg daily, carvedilol  25 mg daily Reports he ran out of diltiazem  and coreg  and has been taking old metoprolol  instead  Does not appear to be on OAC, has been an issue with compliance and ETOH use History of polysubstance abuse UDS positive for cocaine and THC this admission Reports cocaine use 5 days ago Drinks 7-8 beers/day Suspect that A-Fib RVR was triggered by cocaine use  Converted to NSR  Rates have been well controlled  Started on Xarelto  20 mg at bedtime  Continue to replete electrolytes, Mag > 2, K > 4 Increase carvedilol  to 25 mg BID, doing well    Per primary Polysubstance abuse Chronic alcoholic pancreatitis  GERD Anemia Electrolyte  disturbances Asthma History of seizures  For questions or updates, please contact [facility] Please consult [email or web address] for contact info under       Signed, Jason DELENA Donath, PA-C   Patient seen and examined, note reviewed with the signed Advanced Practice Provider. I personally reviewed laboratory data, imaging studies and relevant notes. I independently examined the patient and formulated the important aspects of the plan. I have personally discussed the plan with the patient and/or family. Comments or changes to the note/plan are indicated below.  Patient profile:  53 year old male with a history of WPW s/p ablation in 2015, AF, polysubstance abuse, noncompliance for whom cardiology is consulted for atrial fibrillation.   My Exam:  Physical Exam Vitals and nursing note reviewed.  Constitutional:      Comments: Resting comfortably     Telemetry: Normal sinus rhythm, occasional sinus tachycardia- Personally reviewed  Assessment & Plan:  Paroxysmal atrial fibrillation with RVR, currently sinus rhythm I agree with the management as above History of WPW s/p ablation Polysubstance abuse Chronic alcohol  pancreatitis GERD Anemia   No further recommendations at this time. Cardiology will sign off. Will arrange follow up in 4-6 weeks. Please do not hesitate to reach back out with further questions.    Signed, Emeline Calender, DO    [facility]  04/19/2024 1:09 PM

## 2024-04-22 ENCOUNTER — Other Ambulatory Visit (HOSPITAL_COMMUNITY): Payer: Self-pay

## 2024-04-23 NOTE — Telephone Encounter (Signed)
 PCP will refill lidocaine  solution as well as pantoprazole . PCP will NOT refill oxycodone , this decision was discussed during 12/12 appt.   PCP notes message that cell phone is broken and need to call home phone number. PCP will request that LPN inform patient that refills sent in.   Christine A. Myra, MD, MS  [facility] at [facility]

## 2024-04-23 NOTE — Telephone Encounter (Signed)
 PCP will provide new refill for carvedilol  and Xarelto ; will NOT refill oxycodone . PCP is happy to discuss pain management option(s) at next scheduled visit.   PCP will request that patient be informed that refills will be available for all requested medications except for oxycodone  by LPN. If patient desires a discussion, then a telephone visit can be scheduled.   Christine A. Myra, MD, MS  [facility] at [facility]

## 2024-04-25 NOTE — ED Triage Notes (Signed)
 Pt. BIBEMS from dental appt.; experience 7/10 chest pain left chest into arm; with hx afib, WPW; pt.endorses dizziness and stomach upset . 50 mcg fentanyl  in ems + NS; no aspirin  or nitroglycerin  on board hx of ulcers and took ED meds last night;

## 2024-04-25 NOTE — ED Triage Notes (Signed)
 Pt BIB EMS from the dental clinic for CP.

## 2024-04-26 NOTE — ED Provider Notes (Signed)
 [facility] Emergency Department Provider Note   HPI   History of Present Illness [name] is a 53 year old male with acute pancreatitis and atrial fibrillation who presents with shortness of breath and abdominal pain.  He experiences shortness of breath and dizziness that began around noon today, accompanied by chest pain described as 'stabbing' and 'wrapping around' his chest, similar to previous atrial fibrillation episodes. The chest pain has decreased but persists. He was recently started on Xarelto  for atrial fibrillation, diagnosed three months ago, and was hospitalized until December 12th due to a heart rate of 185 bpm, which was managed down to 130 bpm before discharge.  He is experiencing a flare-up of acute pancreatitis, with pain typically in the upper abdomen radiating to the back, now also extending to the right side. He has a history of pancreatitis and notes that his lipase levels do not always correlate with his symptoms. He reports having had about six CT scans this year for his pancreatitis, and notes that some doctors have advised against having too many.  He reports diarrhea since this morning, described as pale brown with a strong odor, but no blood or black stools. He has a history of ulcers and takes omeprazole , pantoprazole , and Carafate  for management. He also takes Creon  for pancreatic enzyme replacement. Despite regular medication use, he has been vomiting frequently today and has not been able to keep food down.  He has experienced difficulty urinating for the past two to three days and is prescribed Flomax , which he has not taken in the last two weeks due to not refilling the prescription. He reports that Flomax  helps when taken.  He has chills but no fever, and reports chest congestion with white sputum. He smokes about ten cigarettes a day and drinks alcohol , typically a six-pack daily, though he has not consumed alcohol  in the past day and a half. He  attributes his past heavy drinking to personal stressors, which he believes contributed to his pancreatitis.  Per chart review, patient has seen his PCP in the last few days and has refused to represcribe his oxycodone  medication.  There has been documentation of patient having drug-seeking behavior and being aggressive towards staff, requesting IV pain medication.   MDM   BP 133/81   Pulse 105   Temp 37.1 C (98.8 F) (Oral)   Resp 15   Wt 55.8 kg (123 lb)   SpO2 97%   BMI 18.16 kg/m    Hemodynamically stable. On exam, patient is overall well-appearing in no acute distress. Diagnostic workup as below. Disposition pending further work-up. See progress notes below.   Medical Decision Making A 53 year old male with a history of chronic pancreatitis, duodenal ulcers, atrial fibrillation (on Xarelto ), and urinary retention presented with acute onset of shortness of breath, dizziness, atypical abdominal pain radiating to the back and right side, chest pain, vomiting, and diarrhea. He reported recent hospitalization for atrial fibrillation with rapid ventricular response, ongoing alcohol  and nicotine  use, and difficulty urinating due to not taking Flomax . Exam revealed abdominal tenderness, and he described persistent gastrointestinal symptoms with associated vomiting and diarrhea.  Differential diagnosis includes, but is not limited to: - Acute exacerbation of chronic pancreatitis: Acute worsening of chronic pancreatitis is suggested by severe abdominal pain radiating to the back, vomiting, and diarrhea, with a history of chronic pancreatitis and recent alcohol  use; lipase may be unreliable due to chronic disease. - Cardiac etiology (atrial fibrillation, acute coronary syndrome): Cardiac causes were considered due to  chest pain, palpitations, and history of atrial fibrillation, but current EKG and labs were reassuring and symptoms had improved, making acute coronary syndrome or arrhythmia less  likely at this time.  Acute exacerbation of chronic pancreatitis - Administered some pain medication and monitor symptoms - Treat nausea/vomiting with anti-emetics - Encouraged oral hydration with water  and ice chips  Orders Placed This Encounter  Procedures   CBC w/ Differential   Comprehensive Metabolic Panel   hsTroponin I (single, no delta)   Lipase   ECG 12 Lead   ECG 12 Lead   Insert peripheral IV    ED Course as of 04/26/24 1619  Fri Apr 26, 2024  0119 CBC w/ Differential(!):   WBC 7.6  RBC 3.55(!)  HGB 10.5(!)  HCT 32.6(!)  MCV 92.0  MCH 29.5  MCHC 32.1  RDW 18.6(!)  MPV 9.2  Platelet 167  Neutrophils % 54.6  Lymphocytes % 29.2  Monocytes % 13.6  Eosinophils % 2.0  Basophils % 0.6  Absolute Neutrophils 4.2  Absolute Lymphocytes 2.2  Absolute Monocytes  1.0(!)  Absolute Eosinophils 0.1  Absolute Basophils  0.0  Anisocytosis Slight(!) No leukocyte ptosis, slight anemia, no thrombocytopenia  0120 Comprehensive Metabolic Panel(!):   Sodium 135  Potassium 4.3  Chloride 109(!)  CO2 14.0(!)  Anion Gap 12  Bun 11  Creatinine 0.97  BUN/Creatinine Ratio 11  eGFR CKD-EPI (2021) Male >90  Glucose 174  Calcium  8.4(!)  Albumin  3.8  Total Protein 7.5  Total Bilirubin 0.6  SGOT (AST) 32  ALT 26  Alkaline Phosphatase 86 No elevated creatinine  0120 POCT Glucose:   Glucose, POC 174  0120 Lipase:   Lipase 19 Within normal limits  0120 hsTroponin I (single, no delta):   hsTroponin I 4  0122 Presenting from dental clinic with chest pain.  Previously discharged from hospital on 12/12.  At that time was also having chronic chest pain with no EKG changes or elevated troponin.  0124 ECG 12 Lead SINUS RHYTHM WITH SHORT PR INTERVAL RIGHT ATRIAL ENLARGEMENT BORDERLINE ECG   0216 Try PO challenge after fluids, GI cocktail, and pain meds.   9655 Patient has been able to keep down p.o. medications such as GI cocktail.  However has requested from nursing IV  pain medication.  Explained to patient that due to him having history of chronic pancreatitis pain will not be able to go completely away.  Gave him some p.o. pain medication to see if this will help with patient's pain. Documentation in past with PCP declining to refill oxycodone  prescription. Patient exhibiting some drug seeking behavior.   9541 Patient reassessed after getting oxycodone .  Patient was sleeping peacefully in bed.  Pain has improved and patient does not have nausea/vomiting anymore.  Provider discussed with patient about improvement in pain and being ready for discharge.  Patient requested another prescription for pain medication on discharge, however, provider discussed with patient that the ED would not be able to prescribe another prescription for pain medication as patient currently has an active prescription that he has not fully used.  Patient then requested 1 more dose of IV pain medication before discharge along with a turkey sandwich and a prescription for Protonix .  Provider and patient were agreeable to this plan.  Pending pain medication turkey sandwich patient is ready for discharge.    The case was discussed with the attending physician who is in agreement with the above assessment and plan.  - Any discussion of this patient's case/presentation between  myself and consultants, admitting teams, or other team members has been documented above. - Imaging and other studies: EKGs: See ED course - External records reviewed: Previous PCP, ED, and hospital notes - Consideration of admission, observation, transfer, or escalation of care: Discharge as patient's pain improved after above mentioned interventions and discussion. Patient agreeable to reaching back out to PCP about his other prescription medication and figuring out a plan for how to better manage his pain.    ED Clinical Impression   Final diagnoses:  Chronic pancreatitis, unspecified pancreatitis type    (CMS-HCC)  (Primary)  Abdominal pain of multiple sites  Chest pain, unspecified type     Physical Exam   Vitals:   04/25/24 2320 04/26/24 0038 04/26/24 0055 04/26/24 0300  BP: 149/105 133/94 (!) 154/101 133/81  Pulse: 99 99 96 105  Resp: 18 18 20 15   Temp: 36.8 C (98.2 F)  37.1 C (98.8 F)   TempSrc: Oral  Oral   SpO2: 100% 99% 100% 97%  Weight:        Constitutional: In no acute distress. Eyes: Conjunctivae are normal. EOMI.  HEENT: Normocephalic and atraumatic. Mucous membranes are moist.  Neck: Full active range of motion Cardiovascular: See heart rate listed above.  Normal skin perfusion.  2+ pulses in distal extremities, no lower extremity edema.  Respiratory: See respiratory rate listed above.   Speaking easily in full sentences Gastrointestinal: Soft, TTP LUQ and epigastric region.  No guarding. Musculoskeletal: No long bone deformities.  Neurologic: Normal speech and language. No gross focal neurologic deficits are appreciated.  Skin: Skin is warm, dry and intact. Psychiatric: Mood and affect are normal.   Past History   PAST MEDICAL HISTORY/PAST SURGICAL HISTORY:  Past Medical History[1]  Past Surgical History[2]  MEDICATIONS:  No current facility-administered medications for this encounter.  Current Outpatient Medications:    albuterol  HFA 90 mcg/actuation inhaler, Inhale 2 puffs every six (6) hours as needed for wheezing., Disp: 18 g, Rfl: 2   carvedilol  (COREG ) 25 MG tablet, Take 1 tablet (25 mg total) by mouth two (2) times a day., Disp: 180 tablet, Rfl: 1   famotidine  (PEPCID ) 20 MG tablet, Take 1 tablet (20 mg total) by mouth two (2) times a day. (Patient taking differently: Take 1 tablet (20 mg total) by mouth in the morning.), Disp: 180 tablet, Rfl: 1   ferrous sulfate  325 (65 FE) MG tablet, Take 1 tablet (325 mg total) by mouth every other day. (Patient not taking: Reported on 04/25/2024), Disp: 15 tablet, Rfl: 11   fluticasone  propionate (FLONASE ) 50  mcg/actuation nasal spray, 2 sprays into each nostril daily., Disp: 16 g, Rfl: 8   FOLIC ACID  ORAL, Take 1 tablet by mouth daily., Disp: , Rfl:    lidocaine  2 % Soln, 15 mL by Mouth route every three (3) hours., Disp: 100 mL, Rfl: 2   loratadine  (CLARITIN ) 10 mg tablet, Take 1 tablet (10 mg total) by mouth daily., Disp: 90 tablet, Rfl: 3   multivitamin (TAB-A-VITE/THERAGRAN) per tablet, Take 1 tablet by mouth daily., Disp: , Rfl:    naloxone  (NARCAN ) 4 mg nasal spray, 1 spray (4 mg total) into alternating nostrils once as needed for up to 1 dose. One spray in either nostril once for known/suspected opioid overdose. May repeat every 2-3 minutes in alternating nostril til EMS arrives, Disp: 2 each, Rfl: 6   nicotine  (NICODERM CQ ) 14 mg/24 hr patch, Place 1 patch on the skin daily., Disp: 28 patch, Rfl: 0  pancrelipase , Lip-Prot-Amyl, (CREON ) 12,000-38,000 -60,000 unit CpDR capsule, delayed release, Take 2 capsules (24,000 units of lipase total) by mouth Three (3) times a day with a meal. May also take 1 capsule (12,000 units of lipase total) with snacks (up to 3 snacks daily)., Disp: 200 capsule, Rfl: 0   pantoprazole  (PROTONIX ) 40 MG tablet, Take 1 tablet (40 mg total) by mouth daily., Disp: 90 tablet, Rfl: 1   rivaroxaban  (XARELTO ) 20 mg tablet, Take 1 tablet (20 mg total) by mouth daily with evening meal., Disp: 90 tablet, Rfl: 0   sucralfate  (CARAFATE ) 100 mg/mL suspension, Take 10 mL (1 g total) by mouth Three (3) times a day with a meal., Disp: 2700 mL, Rfl: 1   thiamine  (B-1) 100 MG tablet, Take 1 tablet (100 mg total) by mouth daily., Disp: 30 tablet, Rfl: 0  ALLERGIES:  Aspirin ; Enfamil reguline [inf form,iron-dha-ara-poly-gos]; Ibuprofen ; Reglan  [metoclopramide  hcl]; Iodinated contrast media; Latex, natural rubber; and Shellfish containing products  SOCIAL HISTORY:  Social History   Tobacco Use   Smoking status: Every Day    Current packs/day: 0.50    Average packs/day: 0.5  packs/day for 41.0 years (20.5 ttl pk-yrs)    Types: Cigarettes    Start date: 57    Passive exposure: Never   Smokeless tobacco: Current    Types: Snuff   Tobacco comments:    1/2-1ppd; interested in reducing use  Substance Use Topics   Alcohol  use: Yes    Alcohol /week: 6.0 standard drinks of alcohol     Types: 6 Cans of beer per week    FAMILY HISTORY: Family History[3]    Radiology   No orders to display     Laboratory Data   Lab Results  Component Value Date   WBC 7.6 04/25/2024   HGB 10.5 (L) 04/25/2024   HCT 32.6 (L) 04/25/2024   PLT 167 04/25/2024    Lab Results  Component Value Date   NA 135 04/25/2024   K 4.3 04/25/2024   CL 109 (H) 04/25/2024   CO2 14.0 (L) 04/25/2024   BUN 11 04/25/2024   CREATININE 0.97 04/25/2024   GLU 174 04/25/2024   CALCIUM  8.4 (L) 04/25/2024   MG 1.2 (L) 03/15/2024    Lab Results  Component Value Date   BILITOT 0.6 04/25/2024   PROT 7.5 04/25/2024   ALBUMIN  3.8 04/25/2024   ALT 26 04/25/2024   AST 32 04/25/2024   ALKPHOS 86 04/25/2024    No results found for: LABPROT, INR, APTT  Delon Seeds, MD PGY-1 PM&R       [1] Past Medical History: Diagnosis Date   A-fib (CMS-HCC)    Alcohol  abuse    Alcoholism    (CMS-HCC)    Anemia    Anxiety    Asthma (HHS-HCC)    Bipolar disorder (CMS-HCC)    Cannabinoid hyperemesis syndrome 01/25/2023   Chronic bronchitis    (CMS-HCC)    Cocaine abuse (CMS-HCC)    Depression (emotion)    Hypertension    Marijuana abuse    Pancreatitis (HHS-HCC)    Partial small bowel obstruction (CMS-HCC) 03/09/2024   Sickle cell trait   [2] Past Surgical History: Procedure Laterality Date   zygomatic repair Left    30 years ago  [3] Family History Problem Relation Age of Onset   Hypertension Other    Seeds Delon, MD Resident 04/26/24 [phone]

## 2024-04-28 ENCOUNTER — Emergency Department (HOSPITAL_COMMUNITY): Payer: MEDICAID

## 2024-04-28 ENCOUNTER — Emergency Department (HOSPITAL_COMMUNITY)
Admission: EM | Admit: 2024-04-28 | Discharge: 2024-04-29 | Disposition: A | Discharge status: Home / Self Care | Payer: MEDICAID | Attending: Emergency Medicine | Admitting: Emergency Medicine

## 2024-04-28 ENCOUNTER — Encounter (HOSPITAL_COMMUNITY): Payer: Self-pay

## 2024-04-28 ENCOUNTER — Other Ambulatory Visit: Payer: Self-pay

## 2024-04-28 DIAGNOSIS — R1013 Epigastric pain: Secondary | ICD-10-CM | POA: Diagnosis present

## 2024-04-28 DIAGNOSIS — F1410 Cocaine abuse, uncomplicated: Secondary | ICD-10-CM | POA: Insufficient documentation

## 2024-04-28 DIAGNOSIS — I4891 Unspecified atrial fibrillation: Secondary | ICD-10-CM | POA: Diagnosis not present

## 2024-04-28 DIAGNOSIS — R079 Chest pain, unspecified: Secondary | ICD-10-CM | POA: Diagnosis not present

## 2024-04-28 DIAGNOSIS — R1010 Upper abdominal pain, unspecified: Secondary | ICD-10-CM

## 2024-04-28 LAB — HEPATIC FUNCTION PANEL
ALT: 31 U/L (ref 0–44)
AST: 61 U/L — ABNORMAL HIGH (ref 15–41)
Albumin: 3.6 g/dL (ref 3.5–5.0)
Alkaline Phosphatase: 76 U/L (ref 38–126)
Bilirubin, Direct: <0.1 mg/dL (ref 0.0–0.2)
Total Bilirubin: <0.2 mg/dL (ref 0.0–1.2)
Total Protein: 6.2 g/dL — ABNORMAL LOW (ref 6.5–8.1)

## 2024-04-28 LAB — CBC WITH DIFFERENTIAL/PLATELET
Abs Immature Granulocytes: 0.04 K/uL (ref 0.00–0.07)
Basophils Absolute: 0 K/uL (ref 0.0–0.1)
Basophils Relative: 0 %
Eosinophils Absolute: 0.1 K/uL (ref 0.0–0.5)
Eosinophils Relative: 2 %
HCT: 32.9 % — ABNORMAL LOW (ref 39.0–52.0)
Hemoglobin: 10.8 g/dL — ABNORMAL LOW (ref 13.0–17.0)
Immature Granulocytes: 1 %
Lymphocytes Relative: 41 %
Lymphs Abs: 2.8 K/uL (ref 0.7–4.0)
MCH: 30.3 pg (ref 26.0–34.0)
MCHC: 32.8 g/dL (ref 30.0–36.0)
MCV: 92.2 fL (ref 80.0–100.0)
Monocytes Absolute: 0.8 K/uL (ref 0.1–1.0)
Monocytes Relative: 11 %
Neutro Abs: 3 K/uL (ref 1.7–7.7)
Neutrophils Relative %: 45 %
Platelets: 206 K/uL (ref 150–400)
RBC: 3.57 MIL/uL — ABNORMAL LOW (ref 4.22–5.81)
RDW: 17.6 % — ABNORMAL HIGH (ref 11.5–15.5)
WBC: 6.8 K/uL (ref 4.0–10.5)
nRBC: 0 % (ref 0.0–0.2)

## 2024-04-28 LAB — URINE DRUG SCREEN
Amphetamines: NEGATIVE
Barbiturates: NEGATIVE
Benzodiazepines: NEGATIVE
Cocaine: POSITIVE — AB
Fentanyl: NEGATIVE
Methadone Scn, Ur: NEGATIVE
Opiates: NEGATIVE
Tetrahydrocannabinol: NEGATIVE

## 2024-04-28 LAB — ETHANOL: Alcohol, Ethyl (B): 19 mg/dL — ABNORMAL HIGH

## 2024-04-28 LAB — BASIC METABOLIC PANEL WITH GFR
Anion gap: 12 (ref 5–15)
BUN: 8 mg/dL (ref 6–20)
CO2: 16 mmol/L — ABNORMAL LOW (ref 22–32)
Calcium: 8.1 mg/dL — ABNORMAL LOW (ref 8.9–10.3)
Chloride: 110 mmol/L (ref 98–111)
Creatinine, Ser: 0.61 mg/dL (ref 0.61–1.24)
GFR, Estimated: >60 mL/min
Glucose, Bld: 80 mg/dL (ref 70–99)
Potassium: 3.7 mmol/L (ref 3.5–5.1)
Sodium: 138 mmol/L (ref 135–145)

## 2024-04-28 LAB — LIPASE, BLOOD: Lipase: 17 U/L (ref 11–51)

## 2024-04-28 LAB — TROPONIN T, HIGH SENSITIVITY: Troponin T High Sensitivity: <15 ng/L (ref 0–19)

## 2024-04-28 MED ORDER — DIAZEPAM 5 MG/ML IJ SOLN
5.0000 mg | Freq: Once | INTRAMUSCULAR | Status: AC
  Administered 2024-04-29: 5 mg via INTRAVENOUS
  Filled 2024-04-28: qty 2

## 2024-04-28 MED ORDER — FAMOTIDINE IN NACL 20-0.9 MG/50ML-% IV SOLN
20.0000 mg | Freq: Once | INTRAVENOUS | Status: AC
  Administered 2024-04-28: 20 mg via INTRAVENOUS
  Filled 2024-04-28: qty 50

## 2024-04-28 MED ORDER — SODIUM CHLORIDE 0.9 % IV BOLUS
500.0000 mL | Freq: Once | INTRAVENOUS | Status: AC
  Administered 2024-04-28: 500 mL via INTRAVENOUS

## 2024-04-28 MED ORDER — ACETAMINOPHEN 500 MG PO TABS
1000.0000 mg | ORAL_TABLET | Freq: Once | ORAL | Status: AC
  Administered 2024-04-28: 1000 mg via ORAL
  Filled 2024-04-28: qty 2

## 2024-04-28 MED ORDER — MORPHINE SULFATE (PF) 4 MG/ML IV SOLN
4.0000 mg | Freq: Once | INTRAVENOUS | Status: AC
  Administered 2024-04-28: 4 mg via INTRAVENOUS
  Filled 2024-04-28: qty 1

## 2024-04-28 NOTE — ED Notes (Signed)
 Pt removed monitoring cords to ambulate to the restroom. Pt has stable gait. Instructed pt to use call light to let staff know to keep him safe.

## 2024-04-28 NOTE — ED Triage Notes (Signed)
 Pt BIB [facility] with c/o of nausea with chest pressure x1hr. Started with chest tightness, SOB and then pt began to throw up.   Recently started Xarelto  and change in other meds Sinus tach 324 mg Asprin administered

## 2024-04-28 NOTE — Discharge Instructions (Signed)
 Your test results today were reassuring. Continue to follow-up with your primary care doctor and get established with pain management clinic. Take your home medication as previously prescribed. Take your Pepcid  as this will help with your stomach pain. Return for new concerns.

## 2024-04-28 NOTE — ED Notes (Signed)
 Lgt green top recollected and sent to lab

## 2024-04-28 NOTE — ED Provider Notes (Signed)
 " [place] [facility] Provider Note   CSN: [id number] Arrival date & time: 04/28/24  2039     Patient presents with: Abdominal Pain and Chest Pain   [name] is a 53 y.o. male.   Patient with history of A-fib, recently started on Xarelto  presents with epigastric pain and chest pressure for 1 hour.  Patient states had this before with his A-fib.  Patient denies any heart attack or blood clot history.  Patient admits to cocaine and alcohol  use.  Patient is at pancreatitis in the past says this feels different.  No radiation to the back.  No exertional chest pain.  The history is provided by the patient.  Abdominal Pain Associated symptoms: chest pain   Associated symptoms: no chills, no dysuria, no fever, no shortness of breath and no vomiting   Chest Pain Associated symptoms: abdominal pain   Associated symptoms: no back pain, no fever, no headache, no shortness of breath and no vomiting        Prior to Admission medications  Medication Sig Start Date End Date Taking? Authorizing Provider  acetaminophen  (TYLENOL ) 500 MG tablet Take 1,000 mg by mouth daily as needed for mild pain (pain score 1-3) or moderate pain (pain score 4-6).    [provider]  albuterol  (VENTOLIN  HFA) 108 (90 Base) MCG/ACT inhaler Inhale 2 puffs into the lungs every 6 (six) hours as needed for wheezing or shortness of breath. 04/03/24   [provider]  calcium  carbonate (TUMS - DOSED IN MG ELEMENTAL CALCIUM ) 500 MG chewable tablet Chew 2 tablets (400 mg of elemental calcium  total) by mouth 3 (three) times daily as needed for indigestion or heartburn. 03/01/24   Theophilus Pagan, MD  carvedilol  (COREG ) 25 MG tablet Take 1 tablet (25 mg total) by mouth 2 (two) times daily with a meal. 04/19/24   Vernon Ranks, MD  cyclobenzaprine  (FLEXERIL ) 10 MG tablet Take 1 tablet (10 mg total) by mouth 3 (three) times daily as needed for muscle spasms.  04/06/24   Kammerer, Megan L, DO  famotidine  (PEPCID ) 40 MG tablet Take 1 tablet (40 mg total) by mouth daily. Patient taking differently: Take 40 mg by mouth 2 (two) times daily. 10/17/23   Henderly, Britni A, PA-C  feeding supplement (ENSURE PLUS HIGH PROTEIN) LIQD Take 237 mLs by mouth 3 (three) times daily between meals. 03/01/24   Theophilus Pagan, MD  fluticasone  (FLONASE ) 50 MCG/ACT nasal spray Place 2 sprays into both nostrils daily. 04/03/24 04/03/25  [provider]  folic acid  (FOLVITE ) 1 MG tablet Take 1 tablet (1 mg total) by mouth daily. 03/01/24   Theophilus Pagan, MD  lipase/protease/amylase (CREON ) 12000-38000 units CPEP capsule Take 2 capsules (24,000 units of lipase total) by mouth Three (3) times a day with a meal. May also take 1 capsule (12,000 units of lipase total) with snacks (up to 3 snacks daily). 03/10/24   [provider]  loperamide  (IMODIUM  A-D) 2 MG tablet Take 2 mg by mouth 4 (four) times daily as needed for diarrhea or loose stools.    [provider]  magnesium  oxide (MAG-OX) 400 (240 Mg) MG tablet Take 1 tablet (400 mg total) by mouth daily. 11/07/23   Hoy Nidia FALCON, PA-C  Multiple Vitamin (MULTIVITAMIN WITH MINERALS) TABS tablet Take 1 tablet by mouth daily. 08/21/23   Ghimire, Donalda HERO, MD  naloxone  (NARCAN ) nasal spray 4 mg/0.1 mL Place 1 spray into the nose daily as needed (Opioid  overdose). 03/01/24   Theophilus Pagan, MD  nicotine  (NICODERM CQ  - DOSED IN MG/24 HOURS) 21 mg/24hr patch Place 1 patch (21 mg total) onto the skin daily. Patient taking differently: Place 21 mg onto the skin daily as needed (for smoking cessation). 11/14/23   Amin, Ankit C, MD  nicotine  polacrilex (NICORETTE ) 2 MG gum Take 1 each (2 mg total) by mouth as needed for smoking cessation. 03/01/24   Theophilus Pagan, MD  ondansetron  (ZOFRAN -ODT) 4 MG disintegrating tablet Take 1 tablet (4 mg total) by mouth every 8 (eight) hours as needed. 03/26/24   Long,  Fonda MATSU, MD  Oxycodone  HCl 10 MG TABS Take 10 mg by mouth 4 (four) times daily as needed. 04/03/24   [provider]  pantoprazole  (PROTONIX ) 40 MG tablet Take 1 tablet (40 mg total) by mouth daily. 01/18/24   Kehrli, Kelsey F, PA-C  polyethylene glycol powder (GLYCOLAX /MIRALAX ) 17 GM/SCOOP powder Take 17 g by mouth daily as needed for mild constipation. 11/13/23   Amin, Ankit C, MD  promethazine  (PHENERGAN ) 25 MG tablet Take 1 tablet (25 mg total) by mouth every 8 (eight) hours as needed for up to 4 days for nausea or vomiting. 04/06/24 04/16/25  Kammerer, Megan L, DO  rivaroxaban  (XARELTO ) 20 MG TABS tablet Take 1 tablet (20 mg total) by mouth daily with supper. 04/19/24 05/19/24  Vernon Ranks, MD  senna-docusate (SENOKOT-S) 8.6-50 MG tablet Take 1 tablet by mouth at bedtime as needed for mild constipation. Patient not taking: Reported on 04/16/2024 03/26/24   Long, Fonda MATSU, MD  sucralfate  (CARAFATE ) 1 GM/10ML suspension Take 1 g by mouth 4 (four) times daily -  with meals and at bedtime.    [provider]  tamsulosin  (FLOMAX ) 0.4 MG CAPS capsule Take 1 capsule (0.4 mg total) by mouth daily. 04/19/24 05/19/24  Pahwani, Ravi, MD  thiamine  (VITAMIN B-1) 100 MG tablet Take 1 tablet (100 mg total) by mouth daily. 03/01/24   Theophilus Pagan, MD  amitriptyline  (ELAVIL ) 25 MG tablet Take 1 tablet (25 mg total) by mouth at bedtime. Patient not taking: Reported on 08/08/2019 10/15/18 08/08/19  Tobie Yetta HERO, MD    Allergies: Robaxin  [methocarbamol ], Bayer aspirin  [aspirin ], Trazodone  and nefazodone, Adhesive [tape], Latex, Toradol  [ketorolac  tromethamine ], Contrast media [iodinated contrast media], Fish allergy, Reglan  [metoclopramide ], Salmon [fish oil], and Shellfish allergy    Review of Systems  Constitutional:  Negative for chills and fever.  HENT:  Negative for congestion.   Eyes:  Negative for visual disturbance.  Respiratory:  Negative for shortness of breath.   Cardiovascular:   Positive for chest pain.  Gastrointestinal:  Positive for abdominal pain. Negative for vomiting.  Genitourinary:  Negative for dysuria and flank pain.  Musculoskeletal:  Negative for back pain, neck pain and neck stiffness.  Skin:  Negative for rash.  Neurological:  Negative for light-headedness and headaches.    Updated Vital Signs BP (!) 147/88   Pulse 90   Temp 98.2 F (36.8 C) (Oral)   Resp 18   Ht 5' 8 (1.727 m)   Wt 57.2 kg   SpO2 100%   BMI 19.16 kg/m   Physical Exam Vitals and nursing note reviewed.  Constitutional:      General: He is not in acute distress.    Appearance: He is well-developed.  HENT:     Head: Normocephalic and atraumatic.     Mouth/Throat:     Mouth: Mucous membranes are moist.  Eyes:     General:  Right eye: No discharge.        Left eye: No discharge.     Conjunctiva/sclera: Conjunctivae normal.  Neck:     Trachea: No tracheal deviation.  Cardiovascular:     Rate and Rhythm: Normal rate and regular rhythm.     Heart sounds: No murmur heard. Pulmonary:     Effort: Pulmonary effort is normal.     Breath sounds: Normal breath sounds.  Abdominal:     General: There is no distension.     Palpations: Abdomen is soft.     Tenderness: There is abdominal tenderness in the epigastric area. There is no guarding.  Musculoskeletal:     Cervical back: Normal range of motion and neck supple. No rigidity.  Skin:    General: Skin is warm.     Capillary Refill: Capillary refill takes less than 2 seconds.     Findings: No rash.  Neurological:     General: No focal deficit present.     Mental Status: He is alert.     Cranial Nerves: No cranial nerve deficit.  Psychiatric:        Mood and Affect: Mood normal.     (all labs ordered are listed, but only abnormal results are displayed) Labs Reviewed  CBC WITH DIFFERENTIAL/PLATELET - Abnormal; Notable for the following components:      Result Value   RBC 3.57 (*)    Hemoglobin 10.8 (*)     HCT 32.9 (*)    RDW 17.6 (*)    All other components within normal limits  ETHANOL - Abnormal; Notable for the following components:   Alcohol , Ethyl (B) 19 (*)    All other components within normal limits  URINE DRUG SCREEN - Abnormal; Notable for the following components:   Cocaine POSITIVE (*)    All other components within normal limits  BASIC METABOLIC PANEL WITH GFR  HEPATIC FUNCTION PANEL  LIPASE, BLOOD  TROPONIN T, HIGH SENSITIVITY  TROPONIN T, HIGH SENSITIVITY    EKG: EKG Interpretation Date/Time:  Sunday April 28 2024 20:47:03 EST Ventricular Rate:  91 PR Interval:  98 QRS Duration:  77 QT Interval:  329 QTC Calculation: 405 R Axis:   65  Text Interpretation: Sinus tachycardia Atrial premature complexes Short PR interval Biatrial enlargement Baseline wander Confirmed by Tonia Chew [id number]) on 04/28/2024 10:29:11 PM  Radiology: DG Chest Portable 1 View Result Date: 04/28/2024 CLINICAL DATA:  Tachycardia and chest pain EXAM: PORTABLE CHEST 1 VIEW COMPARISON:  04/16/2024 FINDINGS: The heart size and mediastinal contours are within normal limits. Both lungs are clear. The visualized skeletal structures are unremarkable. IMPRESSION: No active disease. Electronically Signed   By: Oneil Devonshire M.D.   On: 04/28/2024 21:18     Procedures   Medications Ordered in the ED  diazepam  (VALIUM ) injection 5 mg (has no administration in time range)  famotidine  (PEPCID ) IVPB 20 mg premix (0 mg Intravenous Stopped 04/28/24 2202)  morphine  (PF) 4 MG/ML injection 4 mg (4 mg Intravenous Given 04/28/24 2132)  sodium chloride  0.9 % bolus 500 mL (0 mLs Intravenous Stopped 04/28/24 2216)  acetaminophen  (TYLENOL ) tablet 1,000 mg (1,000 mg Oral Given 04/28/24 2322)  morphine  (PF) 4 MG/ML injection 4 mg (4 mg Intravenous Given 04/28/24 2322)                                    Medical Decision Making Amount and/or Complexity of  Data Reviewed Labs: ordered. Radiology:  ordered.  Risk OTC drugs. Prescription drug management.   Patient presents with epigastric and chest discomfort differential includes acute gastritis with nausea, pancreatitis, biliary related, atypical cardiac, hernia, other.  Patient admits to cocaine use which may likely be related.  Patient has history of reflux.  Pepcid  ordered, morphine  for pain and plan for serial troponins.  Chest x-ray dependent reviewed no acute abnormalities.  IV fluids given for nausea and decreased appetite today.  Patient denies blood clot history and patient is on anticoagulation so much less likely.  EKG reviewed sinus rhythm no acute ST elevation.  Cocaine returned positive.  Blood work pending.  Patient will be signed out to follow-up serial troponins for final disposition.  Repeat pain meds ordered.  White blood cell count  normal, hemoglobin 10.8.     Final diagnoses:  Acute chest pain  Cocaine abuse (HCC)  Upper abdominal pain    ED Discharge Orders     None          Tonia Chew, MD 04/28/24 2343  "

## 2024-04-28 NOTE — ED Provider Notes (Signed)
 Care of patient assumed from Dr. Tonia.  This patient presented for chest tightness, shortness of breath, nausea, vomiting.  He had a recent admission for atrial fibrillation with RVR.  UDS today is positive for cocaine.  Remaining lab work is pending. Physical Exam  BP (!) 150/96   Pulse (!) 117   Temp 98.2 F (36.8 C) (Oral)   Resp 20   Ht 5' 8 (1.727 m)   Wt 57.2 kg   SpO2 100%   BMI 19.16 kg/m   Physical Exam Vitals and nursing note reviewed.  Constitutional:      General: He is not in acute distress.    Appearance: He is well-developed. He is not ill-appearing, toxic-appearing or diaphoretic.  HENT:     Head: Normocephalic and atraumatic.     Mouth/Throat:     Mouth: Mucous membranes are moist.  Eyes:     Extraocular Movements: Extraocular movements intact.     Conjunctiva/sclera: Conjunctivae normal.  Cardiovascular:     Rate and Rhythm: Normal rate and regular rhythm.  Pulmonary:     Effort: Pulmonary effort is normal. No respiratory distress.  Abdominal:     General: Abdomen is flat.  Musculoskeletal:        General: No swelling.     Cervical back: Neck supple.  Skin:    General: Skin is warm and dry.  Neurological:     General: No focal deficit present.     Mental Status: He is alert and oriented to person, place, and time.  Psychiatric:        Mood and Affect: Mood normal.        Behavior: Behavior normal.     Procedures  Procedures  ED Course / MDM    Medical Decision Making Amount and/or Complexity of Data Reviewed Labs: ordered. Radiology: ordered.  Risk OTC drugs. Prescription drug management.   Remaining lab results were unremarkable.  On assessment, patient is resting comfortably.  He did try to eat some applesauce but had recurrence of nausea and vomiting.  Dose of Zofran  was ordered.  His nausea did improve.  Endorses ongoing pain.  He is currently working with his PCP to get established with [facility] pain clinic.  He was given dose  Percocet for ongoing analgesia.  Patient was discharged in stable condition.       Melvenia Motto, MD 04/29/24 [phone]

## 2024-04-29 LAB — TROPONIN T, HIGH SENSITIVITY: Troponin T High Sensitivity: <15 ng/L (ref 0–19)

## 2024-04-29 MED ORDER — PANCRELIPASE (LIP-PROT-AMYL) 12000-38000 UNITS PO CPEP
12000.0000 [IU] | ORAL_CAPSULE | Freq: Once | ORAL | Status: AC
  Administered 2024-04-29: 12000 [IU] via ORAL
  Filled 2024-04-29: qty 1

## 2024-04-29 MED ORDER — CARVEDILOL 12.5 MG PO TABS
25.0000 mg | ORAL_TABLET | Freq: Two times a day (BID) | ORAL | Status: DC
  Administered 2024-04-29: 25 mg via ORAL
  Filled 2024-04-29: qty 2

## 2024-04-29 MED ORDER — ONDANSETRON HCL 4 MG/2ML IJ SOLN
4.0000 mg | Freq: Once | INTRAMUSCULAR | Status: AC
  Administered 2024-04-29: 4 mg via INTRAVENOUS
  Filled 2024-04-29: qty 2

## 2024-04-29 MED ORDER — OXYCODONE-ACETAMINOPHEN 5-325 MG PO TABS
2.0000 | ORAL_TABLET | Freq: Once | ORAL | Status: AC
  Administered 2024-04-29: 2 via ORAL
  Filled 2024-04-29: qty 2

## 2024-05-05 ENCOUNTER — Other Ambulatory Visit: Payer: Self-pay

## 2024-05-05 ENCOUNTER — Emergency Department (HOSPITAL_COMMUNITY)
Admission: EM | Admit: 2024-05-05 | Discharge: 2024-05-05 | Disposition: A | Discharge status: Home / Self Care | Payer: MEDICAID | Attending: Emergency Medicine | Admitting: Emergency Medicine

## 2024-05-05 ENCOUNTER — Emergency Department (HOSPITAL_COMMUNITY): Payer: MEDICAID

## 2024-05-05 DIAGNOSIS — Z7951 Long term (current) use of inhaled steroids: Secondary | ICD-10-CM | POA: Diagnosis not present

## 2024-05-05 DIAGNOSIS — I10 Essential (primary) hypertension: Secondary | ICD-10-CM | POA: Insufficient documentation

## 2024-05-05 DIAGNOSIS — Z7901 Long term (current) use of anticoagulants: Secondary | ICD-10-CM | POA: Diagnosis not present

## 2024-05-05 DIAGNOSIS — I4891 Unspecified atrial fibrillation: Secondary | ICD-10-CM | POA: Insufficient documentation

## 2024-05-05 DIAGNOSIS — Z79899 Other long term (current) drug therapy: Secondary | ICD-10-CM | POA: Insufficient documentation

## 2024-05-05 DIAGNOSIS — Z91040 Latex allergy status: Secondary | ICD-10-CM | POA: Diagnosis not present

## 2024-05-05 DIAGNOSIS — R0789 Other chest pain: Secondary | ICD-10-CM | POA: Diagnosis not present

## 2024-05-05 DIAGNOSIS — J45909 Unspecified asthma, uncomplicated: Secondary | ICD-10-CM | POA: Diagnosis not present

## 2024-05-05 DIAGNOSIS — R1013 Epigastric pain: Secondary | ICD-10-CM | POA: Diagnosis present

## 2024-05-05 DIAGNOSIS — K860 Alcohol-induced chronic pancreatitis: Secondary | ICD-10-CM | POA: Diagnosis not present

## 2024-05-05 LAB — URINALYSIS, ROUTINE W REFLEX MICROSCOPIC
Bilirubin Urine: NEGATIVE
Glucose, UA: NEGATIVE mg/dL
Hgb urine dipstick: NEGATIVE
Ketones, ur: NEGATIVE mg/dL
Leukocytes,Ua: NEGATIVE
Nitrite: NEGATIVE
Protein, ur: NEGATIVE mg/dL
Specific Gravity, Urine: 1.011 (ref 1.005–1.030)
pH: 5 (ref 5.0–8.0)

## 2024-05-05 LAB — BASIC METABOLIC PANEL WITH GFR
Anion gap: 15 (ref 5–15)
BUN: 15 mg/dL (ref 6–20)
CO2: 19 mmol/L — ABNORMAL LOW (ref 22–32)
Calcium: 8.4 mg/dL — ABNORMAL LOW (ref 8.9–10.3)
Chloride: 107 mmol/L (ref 98–111)
Creatinine, Ser: 0.91 mg/dL (ref 0.61–1.24)
GFR, Estimated: >60 mL/min
Glucose, Bld: 103 mg/dL — ABNORMAL HIGH (ref 70–99)
Potassium: 4.9 mmol/L (ref 3.5–5.1)
Sodium: 140 mmol/L (ref 135–145)

## 2024-05-05 LAB — HEPATIC FUNCTION PANEL
ALT: 55 U/L — ABNORMAL HIGH (ref 0–44)
AST: 109 U/L — ABNORMAL HIGH (ref 15–41)
Albumin: 4.4 g/dL (ref 3.5–5.0)
Alkaline Phosphatase: 95 U/L (ref 38–126)
Bilirubin, Direct: 0.1 mg/dL (ref 0.0–0.2)
Indirect Bilirubin: 0.2 mg/dL — ABNORMAL LOW (ref 0.3–0.9)
Total Bilirubin: 0.3 mg/dL (ref 0.0–1.2)
Total Protein: 7.9 g/dL (ref 6.5–8.1)

## 2024-05-05 LAB — URINE DRUG SCREEN
Amphetamines: NEGATIVE
Barbiturates: NEGATIVE
Benzodiazepines: NEGATIVE
Cocaine: POSITIVE — AB
Fentanyl: NEGATIVE
Methadone Scn, Ur: NEGATIVE
Opiates: NEGATIVE
Tetrahydrocannabinol: NEGATIVE

## 2024-05-05 LAB — CBC
HCT: 36.8 % — ABNORMAL LOW (ref 39.0–52.0)
Hemoglobin: 11.8 g/dL — ABNORMAL LOW (ref 13.0–17.0)
MCH: 29.5 pg (ref 26.0–34.0)
MCHC: 32.1 g/dL (ref 30.0–36.0)
MCV: 92 fL (ref 80.0–100.0)
Platelets: 228 K/uL (ref 150–400)
RBC: 4 MIL/uL — ABNORMAL LOW (ref 4.22–5.81)
RDW: 17.3 % — ABNORMAL HIGH (ref 11.5–15.5)
WBC: 6.9 K/uL (ref 4.0–10.5)
nRBC: 0 % (ref 0.0–0.2)

## 2024-05-05 LAB — TROPONIN T, HIGH SENSITIVITY
Troponin T High Sensitivity: <15 ng/L (ref 0–19)
Troponin T High Sensitivity: <15 ng/L (ref 0–19)

## 2024-05-05 LAB — LIPASE, BLOOD: Lipase: 20 U/L (ref 11–51)

## 2024-05-05 MED ORDER — OXYCODONE-ACETAMINOPHEN 5-325 MG PO TABS: 1.0000 | ORAL_TABLET | Freq: Once | ORAL | Status: DC

## 2024-05-05 MED ORDER — ONDANSETRON HCL 4 MG/2ML IJ SOLN
4.0000 mg | Freq: Once | INTRAMUSCULAR | Status: AC
  Administered 2024-05-05: 4 mg via INTRAVENOUS
  Filled 2024-05-05: qty 2

## 2024-05-05 MED ORDER — FENTANYL CITRATE (PF) 50 MCG/ML IJ SOSY
50.0000 ug | PREFILLED_SYRINGE | Freq: Once | INTRAMUSCULAR | Status: AC
  Administered 2024-05-05: 50 ug via INTRAVENOUS
  Filled 2024-05-05: qty 1

## 2024-05-05 MED ORDER — LIDOCAINE VISCOUS HCL 2 % MT SOLN
15.0000 mL | Freq: Once | OROMUCOSAL | Status: AC
  Administered 2024-05-05: 15 mL via ORAL
  Filled 2024-05-05: qty 15

## 2024-05-05 MED ORDER — OXYCODONE-ACETAMINOPHEN 5-325 MG PO TABS
2.0000 | ORAL_TABLET | Freq: Once | ORAL | Status: AC
  Administered 2024-05-05: 2 via ORAL
  Filled 2024-05-05: qty 2

## 2024-05-05 MED ORDER — ALUM & MAG HYDROXIDE-SIMETH 200-200-20 MG/5ML PO SUSP
30.0000 mL | Freq: Once | ORAL | Status: AC
  Administered 2024-05-05: 30 mL via ORAL
  Filled 2024-05-05: qty 30

## 2024-05-05 NOTE — Discharge Instructions (Signed)
"  Please follow-up with your primary doctor  "

## 2024-05-05 NOTE — ED Provider Triage Note (Signed)
 Emergency Medicine Provider Triage Evaluation Note  Bowman Isidore Margraf , a 53 y.o. male  was evaluated in triage.  Pt complains of upper abdominal pain and chest pain. Reports that this feels like his pancreatitis. He reports that he hasn't used any cocaine or MJ in the past 4 days. Reports that he consumes alcohol  3-4 times per week. Last drink was Friday and he consumed a six pack of beer. Reports he thinks that this has flared his afib and he has been feeling SOB with exertion. This all started this morning. He reports that he thinks he may have some red tint in his stool and sees some red stool on the TP. Reports he had some red velvet cake though, so he is unsure. Denies any hematemesis. Reports he feels some anxiety as well. Reports compliance with his medicaitons.    Review of Systems  Positive:  Negative:   Physical Exam  BP (!) 164/118 (BP Location: Right Arm)   Pulse (!) 120   Temp 97.9 F (36.6 C) (Oral)   Resp 20   SpO2 100%  Gen:   Awake, no distress   Resp:  Normal effort  MSK:   Moves extremities without difficulty  Other:  Epigastric tenderness to palpation. Soft. No guarding or rebound. Chest non tender. Palpable and symmetric radial, DP, and PT pulses. Chest wall non tender to palpation. Speaking in full sentences.   Medical Decision Making  Medically screening exam initiated at 10:55 AM.  Appropriate orders placed.  Jaidev Carnel Stegman was informed that the remainder of the evaluation will be completed by another provider, this initial triage assessment does not replace that evaluation, and the importance of remaining in the ED until their evaluation is complete.  Zofran  ordered.    Bernis Ernst, PA-C 05/05/24 1102

## 2024-05-05 NOTE — ED Triage Notes (Signed)
 Patient arrives via [facility] for chest pain from home. Patient had central chest pressure x30 min ago at rest. Non reproducible, non radiating, ST 110 currently. 12 lead unremarkable. Aspirin  allergy and ED meds in last 24 hours so no nitroglycerin .  20 LAC.   BP 150/110 99 on room air CBG 183

## 2024-05-05 NOTE — ED Provider Notes (Signed)
 " [place] [facility] Provider Note  CSN: [id number] Arrival date & time: 05/05/24 1038  Chief Complaint(s) Chest Pain  HPI [name] is a 53 y.o. male history of chronic pancreatitis due to alcohol  abuse, A-fib, cocaine abuse, presenting to the emergency department with chest pain.  He reports some chest pain on the left side.  Also reports some epigastric pain.  Feels like prior pancreatitis.  Reports some nausea.  No vomiting.  Does report recently used cocaine.  Symptoms mild.  Felt some palpitations earlier that improved.  Request GI cocktail and morphine    Past Medical History Past Medical History:  Diagnosis Date   Alcoholism /alcohol  abuse    Anemia    Anxiety    Arthritis    knees; arms; elbows (03/26/2015)   Asthma    Bipolar disorder (HCC)    Chronic bronchitis (HCC)    Chronic lower back pain    Chronic pancreatitis (HCC)    Cocaine abuse (HCC)    Depression    Family history of adverse reaction to anesthesia    Femoral condyle fracture (HCC) 03/08/2014   left medial/notes 03/09/2014   GERD (gastroesophageal reflux disease)    H/O hiatal hernia    H/O suicide attempt 10/2012   High cholesterol    History of blood transfusion 10/2012   when I tried to commit suicide   History of stomach ulcers    Hypertension    Marijuana abuse, continuous    Migraine    a few times/year (03/26/2015)   Pancreatitis    Pneumonia 1990's X 3   PTSD (post-traumatic stress disorder)    Seizures (HCC)    Sickle cell trait    WPW (Wolff-Parkinson-White syndrome)    thelbert 03/06/2013   Patient Active Problem List   Diagnosis Date Noted   Anemia 02/25/2024   Atrial fibrillation with RVR (HCC) 02/20/2024   Chronic health problem 02/20/2024   Atrial fibrillation with rapid ventricular response (HCC) 12/03/2023   Abdominal pain 11/22/2023   Hypertensive urgency 11/12/2023   Syncope and collapse 11/10/2023   Non-cardiac chest pain  11/10/2023   Intractable diarrhea 08/10/2023   Intractable nausea and vomiting 06/18/2023   History of peptic ulcer 03/07/2023   Lumbosacral radiculopathy 03/07/2023   Duodenitis 03/06/2023   Gastric AVM 03/06/2023   AVM (arteriovenous malformation) of small bowel, 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   Hyperkalemia 03/03/2023   Acute urinary retention 03/01/2023   Intractable pain 02/09/2023   Metabolic acidosis, increased anion gap 02/08/2023   Cocaine abuse (HCC) 02/08/2023   Adynamic ileus (HCC) 01/25/2023   Cannabinoid hyperemesis syndrome 01/25/2023   Epigastric abdominal pain 01/09/2023   Asthma, chronic 01/09/2023   Ileus (HCC) 01/09/2023   Hypokalemia 09/18/2022   NSVT (nonsustained ventricular tachycardia) (HCC) 01/20/2022   Normal anion gap metabolic acidosis 12/08/2021   Hypoalbuminemia 12/08/2021   WPW (Wolff-Parkinson-White syndrome) 12/06/2021   Paroxysmal atrial fibrillation (HCC) 12/06/2021   Pulmonary nodule 12/06/2021   Leukocytosis 06/07/2021   Epigastric pain 06/07/2021   Urinary retention    Protein-calorie malnutrition, severe 11/17/2020   Hypomagnesemia    Hypocalcemia 10/23/2020   Seizure (HCC) 11/13/2018   Acute on chronic pancreatitis (HCC) 09/28/2017   Tachycardia 03/18/2017   Nausea, vomiting, and diarrhea 03/18/2017   Diarrhea 03/18/2017   GERD (gastroesophageal reflux disease)    Chronic alcoholic pancreatitis (HCC) 12/05/2016   Alcohol  use disorder 12/05/2016   Normocytic anemia 12/05/2016   Alcohol  use disorder, severe, dependence (HCC) 07/25/2016  Major depressive disorder, 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 Authorizing Provider  acetaminophen  (TYLENOL ) 500 MG tablet Take 1,000 mg by mouth daily as needed for mild pain (pain score 1-3) or moderate pain (pain score 4-6).    [provider]  albuterol  (VENTOLIN  HFA) 108 (90 Base) MCG/ACT inhaler Inhale 2 puffs into the lungs every 6 (six) hours as needed for wheezing or shortness of breath. 04/03/24   [provider]  calcium  carbonate (TUMS - DOSED IN MG ELEMENTAL CALCIUM ) 500 MG chewable tablet Chew 2 tablets (400 mg of elemental calcium  total) by mouth 3 (three) times daily as needed for indigestion or heartburn. 03/01/24   Theophilus Pagan, MD  carvedilol  (COREG ) 25 MG tablet Take 1 tablet (25 mg total) by mouth 2 (two) times daily with a meal. 04/19/24   Vernon Ranks, MD  cyclobenzaprine  (FLEXERIL ) 10 MG tablet Take 1 tablet (10 mg total) by mouth 3 (three) times daily as needed for muscle spasms. 04/06/24   Kammerer, Megan L, DO  famotidine  (PEPCID ) 40 MG tablet Take 1 tablet (40 mg total) by mouth daily. Patient taking differently: Take 40 mg by mouth 2 (two) times daily. 10/17/23   Henderly, Britni A, PA-C  feeding supplement (ENSURE PLUS HIGH PROTEIN) LIQD Take 237 mLs by mouth 3 (three) times daily between meals. 03/01/24   Theophilus Pagan, MD  fluticasone  (FLONASE ) 50 MCG/ACT nasal spray Place 2 sprays into both nostrils daily. 04/03/24 04/03/25  [provider]  folic acid  (FOLVITE ) 1 MG tablet Take 1 tablet (1 mg total) by mouth daily. 03/01/24   Theophilus Pagan, MD  lipase/protease/amylase (CREON ) 12000-38000 units CPEP capsule Take 2 capsules (24,000 units of lipase total) by mouth Three (3) times a day with a meal. May also take 1 capsule (12,000 units of lipase total) with snacks (up to 3 snacks daily). 03/10/24   [provider]  loperamide  (IMODIUM  A-D) 2  MG tablet Take 2 mg by mouth 4 (four) times daily as needed for diarrhea or loose stools.    [provider]  magnesium  oxide (MAG-OX) 400 (240 Mg) MG tablet Take 1 tablet (400 mg total) by mouth daily. 11/07/23   Hoy Nidia FALCON, PA-C  Multiple Vitamin (MULTIVITAMIN WITH MINERALS) TABS tablet Take 1 tablet by mouth daily. 08/21/23   Ghimire, Donalda HERO, MD  naloxone  (NARCAN ) nasal spray 4 mg/0.1 mL Place 1 spray into the nose daily as needed (Opioid overdose). 03/01/24   Theophilus Pagan, MD  nicotine  (NICODERM CQ  - DOSED IN MG/24 HOURS) 21 mg/24hr patch Place 1 patch (21 mg total) onto the skin daily. Patient taking differently: Place 21 mg onto the skin daily as needed (for smoking cessation). 11/14/23   Amin, Ankit C, MD  nicotine  polacrilex (NICORETTE ) 2 MG gum Take 1 each (2 mg total) by mouth as needed for smoking cessation. 03/01/24   Theophilus Pagan, MD  ondansetron  (ZOFRAN -ODT) 4 MG disintegrating tablet Take 1 tablet (4 mg total) by mouth every 8 (eight) hours as needed. 03/26/24   Long, Fonda MATSU, MD  Oxycodone   HCl 10 MG TABS Take 10 mg by mouth 4 (four) times daily as needed. 04/03/24   [provider]  pantoprazole  (PROTONIX ) 40 MG tablet Take 1 tablet (40 mg total) by mouth daily. 01/18/24   Kehrli, Kelsey F, PA-C  polyethylene glycol powder (GLYCOLAX /MIRALAX ) 17 GM/SCOOP powder Take 17 g by mouth daily as needed for mild constipation. 11/13/23   Amin, Ankit C, MD  promethazine  (PHENERGAN ) 25 MG tablet Take 1 tablet (25 mg total) by mouth every 8 (eight) hours as needed for up to 4 days for nausea or vomiting. 04/06/24 04/16/25  Kammerer, Megan L, DO  rivaroxaban  (XARELTO ) 20 MG TABS tablet Take 1 tablet (20 mg total) by mouth daily with supper. 04/19/24 05/19/24  Vernon Ranks, MD  senna-docusate (SENOKOT-S) 8.6-50 MG tablet Take 1 tablet by mouth at bedtime as needed for mild constipation. Patient not taking: Reported on 04/16/2024 03/26/24   Long, Fonda MATSU, MD  sucralfate   (CARAFATE ) 1 GM/10ML suspension Take 1 g by mouth 4 (four) times daily -  with meals and at bedtime.    [provider]  tamsulosin  (FLOMAX ) 0.4 MG CAPS capsule Take 1 capsule (0.4 mg total) by mouth daily. 04/19/24 05/19/24  Vernon Ranks, MD  thiamine  (VITAMIN B-1) 100 MG tablet Take 1 tablet (100 mg total) by mouth daily. 03/01/24   Theophilus Pagan, MD  amitriptyline  (ELAVIL ) 25 MG tablet Take 1 tablet (25 mg total) by mouth at bedtime. Patient not taking: Reported on 08/08/2019 10/15/18 08/08/19  Tobie Yetta HERO, MD                                                                                                                                    Past Surgical History Past Surgical History:  Procedure Laterality Date   BIOPSY  11/25/2017   Procedure: BIOPSY;  Surgeon: Burnette Fallow, MD;  Location: [facility] ENDOSCOPY;  Service: Endoscopy;;   BIOPSY  10/14/2018   Procedure: BIOPSY;  Surgeon: Burnette Fallow, MD;  Location: [facility] ENDOSCOPY;  Service: Endoscopy;;   BIOPSY  03/06/2023   Procedure: BIOPSY;  Surgeon: Wilhelmenia Aloha Raddle., MD;  Location: [facility] ENDOSCOPY;  Service: Gastroenterology;;   CARDIAC CATHETERIZATION     CYST ENTEROSTOMY  01/02/2020   Procedure: CYST ASPIRATION;  Surgeon: Teressa Toribio SQUIBB, MD;  Location: [facility] ENDOSCOPY;  Service: Endoscopy;;   ESOPHAGOGASTRODUODENOSCOPY N/A 03/06/2023   Procedure: ESOPHAGOGASTRODUODENOSCOPY (EGD);  Surgeon: Wilhelmenia Aloha Raddle., MD;  Location: [facility] ENDOSCOPY;  Service: Gastroenterology;  Laterality: N/A;   ESOPHAGOGASTRODUODENOSCOPY (EGD) WITH PROPOFOL  N/A 11/25/2017   Procedure: ESOPHAGOGASTRODUODENOSCOPY (EGD) WITH PROPOFOL ;  Surgeon: Burnette Fallow, MD;  Location: [facility] ENDOSCOPY;  Service: Endoscopy;  Laterality: N/A;   ESOPHAGOGASTRODUODENOSCOPY (EGD) WITH PROPOFOL  Left 10/14/2018   Procedure: ESOPHAGOGASTRODUODENOSCOPY (EGD) WITH PROPOFOL ;  Surgeon: Burnette Fallow, MD;  Location: [facility] ENDOSCOPY;  Service: Endoscopy;  Laterality: Left;    ESOPHAGOGASTRODUODENOSCOPY (EGD) WITH PROPOFOL  N/A 11/14/2018   Procedure: ESOPHAGOGASTRODUODENOSCOPY (EGD) WITH PROPOFOL ;  Surgeon: Celestia Agent, MD;  Location: [facility] ENDOSCOPY;  Service: Gastroenterology;  Laterality: N/A;   ESOPHAGOGASTRODUODENOSCOPY (EGD) WITH PROPOFOL  N/A 01/02/2020   Procedure: ESOPHAGOGASTRODUODENOSCOPY (EGD) WITH PROPOFOL ;  Surgeon: Teressa Toribio SQUIBB, MD;  Location: [facility] ENDOSCOPY;  Service: Endoscopy;  Laterality: N/A;   ESOPHAGOGASTRODUODENOSCOPY (EGD) WITH PROPOFOL  N/A 10/25/2020   Procedure: ESOPHAGOGASTRODUODENOSCOPY (EGD) WITH PROPOFOL ;  Surgeon: Wilhelmenia Aloha Raddle., MD;  Location: [name] ENDOSCOPY;  Service: Gastroenterology;  Laterality: N/A;   EUS N/A 01/02/2020   Procedure: UPPER ENDOSCOPIC ULTRASOUND (EUS) RADIAL;  Surgeon: Teressa Toribio SQUIBB, MD;  Location: [facility] ENDOSCOPY;  Service: Endoscopy;  Laterality: N/A;   EUS N/A 03/06/2023   Procedure: UPPER ENDOSCOPIC ULTRASOUND (EUS) RADIAL;  Surgeon: Wilhelmenia Aloha Raddle., MD;  Location: [facility] ENDOSCOPY;  Service: Gastroenterology;  Laterality: N/A;   EYE SURGERY Left 1990's   result of trauma    FACIAL FRACTURE SURGERY Left 1990's   result of trauma    FINE NEEDLE ASPIRATION N/A 03/06/2023   Procedure: FINE NEEDLE ASPIRATION (FNA) LINEAR;  Surgeon: Wilhelmenia Aloha Raddle., MD;  Location: [facility] ENDOSCOPY;  Service: Gastroenterology;  Laterality: N/A;   FLEXIBLE SIGMOIDOSCOPY N/A 10/25/2020   Procedure: FLEXIBLE SIGMOIDOSCOPY;  Surgeon: Wilhelmenia Aloha Raddle., MD;  Location: [facility] ENDOSCOPY;  Service: Gastroenterology;  Laterality: N/A;   FRACTURE SURGERY     HEMOSTASIS CLIP PLACEMENT  10/25/2020   Procedure: HEMOSTASIS CLIP PLACEMENT;  Surgeon: Wilhelmenia Aloha Raddle., MD;  Location: [facility] ENDOSCOPY;  Service: Gastroenterology;;   HERNIA REPAIR     HOT HEMOSTASIS N/A 03/06/2023   Procedure: HOT HEMOSTASIS (ARGON PLASMA COAGULATION/BICAP);  Surgeon: Wilhelmenia Aloha Raddle., MD;  Location: [facility] ENDOSCOPY;  Service: Gastroenterology;   Laterality: N/A;   LEFT HEART CATHETERIZATION WITH CORONARY ANGIOGRAM Right 03/07/2013   Procedure: LEFT HEART CATHETERIZATION WITH CORONARY ANGIOGRAM;  Surgeon: Salena GORMAN Negri, MD;  Location: [facility] CATH LAB;  Service: Cardiovascular;  Laterality: Right;   UMBILICAL HERNIA REPAIR     UPPER GASTROINTESTINAL ENDOSCOPY     Family History Family History  Problem Relation Age of Onset   Hypertension Mother    Cirrhosis Father    Alcoholism Father    Hypertension Father    Melanoma Father    Hypertension Other    Coronary artery disease Other     Social History Social History[1] Allergies Robaxin  [methocarbamol ], Bayer aspirin  [aspirin ], Trazodone  and nefazodone, Adhesive [tape], Latex, Toradol  [ketorolac  tromethamine ], Contrast media [iodinated contrast media], Fish allergy, Reglan  [metoclopramide ], Salmon [fish oil], and Shellfish allergy  Review of Systems Review of Systems  All other systems reviewed and are negative.   Physical Exam Vital Signs  I have reviewed the triage vital signs BP (!) 144/99   Pulse (!) 101   Temp 97.8 F (36.6 C) (Oral)   Resp 18   SpO2 100%  Physical Exam Vitals and nursing note reviewed.  Constitutional:      General: He is not in acute distress.    Appearance: Normal appearance.  HENT:     Mouth/Throat:     Mouth: Mucous membranes are moist.  Eyes:     Conjunctiva/sclera: Conjunctivae normal.  Cardiovascular:     Rate and Rhythm: Normal rate and regular rhythm.  Pulmonary:     Effort: Pulmonary effort is normal. No respiratory distress.     Breath sounds: Normal breath sounds.  Abdominal:     General: Abdomen is flat.     Palpations: Abdomen is soft.     Tenderness: There is no abdominal tenderness.  Musculoskeletal:     Right  lower leg: No edema.     Left lower leg: No edema.  Skin:    General: Skin is warm and dry.     Capillary Refill: Capillary refill takes less than 2 seconds.  Neurological:     Mental Status: He is alert and  oriented to person, place, and time. Mental status is at baseline.  Psychiatric:        Mood and Affect: Mood normal.        Behavior: Behavior normal.     ED Results and Treatments Labs (all labs ordered are listed, but only abnormal results are displayed) Labs Reviewed  BASIC METABOLIC PANEL WITH GFR - Abnormal; Notable for the following components:      Result Value   CO2 19 (*)    Glucose, Bld 103 (*)    Calcium  8.4 (*)    All other components within normal limits  CBC - Abnormal; Notable for the following components:   RBC 4.00 (*)    Hemoglobin 11.8 (*)    HCT 36.8 (*)    RDW 17.3 (*)    All other components within normal limits  HEPATIC FUNCTION PANEL - Abnormal; Notable for the following components:   AST 109 (*)    ALT 55 (*)    Indirect Bilirubin 0.2 (*)    All other components within normal limits  URINE DRUG SCREEN - Abnormal; Notable for the following components:   Cocaine POSITIVE (*)    All other components within normal limits  LIPASE, BLOOD  URINALYSIS, ROUTINE W REFLEX MICROSCOPIC  POC OCCULT BLOOD, ED  TROPONIN T, HIGH SENSITIVITY  TROPONIN T, HIGH SENSITIVITY                                                                                                                          Radiology DG Chest 2 View Result Date: 05/05/2024 CLINICAL DATA:  Chest pain. EXAM: CHEST - 2 VIEW COMPARISON:  April 28, 2024 FINDINGS: The heart size and mediastinal contours are within normal limits. Both lungs are clear. The visualized skeletal structures are unremarkable. IMPRESSION: No active cardiopulmonary disease. Electronically Signed   By: Suzen Dials M.D.   On: 05/05/2024 12:34    Pertinent labs & imaging results that were available during my care of the patient were reviewed by me and considered in my medical decision making (see MDM for details).  Medications Ordered in ED Medications  ondansetron  (ZOFRAN ) injection 4 mg (4 mg Intravenous Given 05/05/24  1138)  fentaNYL  (SUBLIMAZE ) injection 50 mcg (50 mcg Intravenous Given 05/05/24 1137)  alum & mag hydroxide-simeth (MAALOX/MYLANTA) 200-200-20 MG/5ML suspension 30 mL (30 mLs Oral Given 05/05/24 2101)    And  lidocaine  (XYLOCAINE ) 2 % viscous mouth solution 15 mL (15 mLs Oral Given 05/05/24 2101)  oxyCODONE -acetaminophen  (PERCOCET/ROXICET) 5-325 MG per tablet 2 tablet (2 tablets Oral Given 05/05/24 2101)  Procedures Procedures  (including critical care time)  Medical Decision Making / ED Course   MDM:  53 year old presenting to the emergency department with chest pain, abdominal pain.  Workup reassuring, troponin negative x 2.  Lipase is negative.  Symptoms seem somewhat chronic.  Patient has frequent presentations for chronic pancreatitis.  He has no abdominal tenderness.  I do not think he needs abdominal imaging at this time. Seems consistent with his chronic pancreatitis.  Doubt dangerous cause of chest pain given negative troponin x 2, reassuring workup, doubt PE, dissection, PTX, pneumonia.  He had a negative stress test within the past few months.  He does continue to use cocaine drink alcohol  which is likely contributing to his symptoms, instructed him to stop using cocaine.  Feel he is stable for discharge.         Additional history obtained: -Additional history obtained from ems -External records from outside source obtained and reviewed including: Chart review including previous notes, labs, imaging, consultation notes including prior notes    Lab Tests: -I ordered, reviewed, and interpreted labs.   The pertinent results include:   Labs Reviewed  BASIC METABOLIC PANEL WITH GFR - Abnormal; Notable for the following components:      Result Value   CO2 19 (*)    Glucose, Bld 103 (*)    Calcium  8.4 (*)    All other components within normal  limits  CBC - Abnormal; Notable for the following components:   RBC 4.00 (*)    Hemoglobin 11.8 (*)    HCT 36.8 (*)    RDW 17.3 (*)    All other components within normal limits  HEPATIC FUNCTION PANEL - Abnormal; Notable for the following components:   AST 109 (*)    ALT 55 (*)    Indirect Bilirubin 0.2 (*)    All other components within normal limits  URINE DRUG SCREEN - Abnormal; Notable for the following components:   Cocaine POSITIVE (*)    All other components within normal limits  LIPASE, BLOOD  URINALYSIS, ROUTINE W REFLEX MICROSCOPIC  POC OCCULT BLOOD, ED  TROPONIN T, HIGH SENSITIVITY  TROPONIN T, HIGH SENSITIVITY    Notable for + cocaine, negative troponin  EKG   EKG Interpretation Date/Time:  Sunday May 05 2024 10:48:09 EST Ventricular Rate:  125 PR Interval:  124 QRS Duration:  66 QT Interval:  328 QTC Calculation: 473 R Axis:   56  Text Interpretation: Sinus tachycardia with Premature atrial complexes Right atrial enlargement T wave abnormality, consider inferior ischemia Abnormal ECG When compared with ECG of 28-Apr-2024 20:47, No significant change since last tracing Confirmed by Francesca Fallow ([id number]) on 05/05/2024 8:09:33 PM         Imaging Studies ordered: I ordered imaging studies including CXR On my interpretation imaging demonstrates no acute process I independently visualized and interpreted imaging. I agree with the radiologist interpretation   Medicines ordered and prescription drug management: Meds ordered this encounter  Medications   ondansetron  (ZOFRAN ) injection 4 mg   fentaNYL  (SUBLIMAZE ) injection 50 mcg   DISCONTD: oxyCODONE -acetaminophen  (PERCOCET/ROXICET) 5-325 MG per tablet 1 tablet    Refill:  0   AND Linked Order Group    alum & mag hydroxide-simeth (MAALOX/MYLANTA) 200-200-20 MG/5ML suspension 30 mL    lidocaine  (XYLOCAINE ) 2 % viscous mouth solution 15 mL   oxyCODONE -acetaminophen  (PERCOCET/ROXICET) 5-325 MG per  tablet 2 tablet    Refill:  0    -I have reviewed the patients home medicines  and have made adjustments as needed   Social Determinants of Health:  Diagnosis or treatment significantly limited by social determinants of health: alcohol  use and polysubstance abuse   Reevaluation: After the interventions noted above, I reevaluated the patient and found that their symptoms have improved  Co morbidities that complicate the patient evaluation  Past Medical History:  Diagnosis Date   Alcoholism /alcohol  abuse    Anemia    Anxiety    Arthritis    knees; arms; elbows (03/26/2015)   Asthma    Bipolar disorder (HCC)    Chronic bronchitis (HCC)    Chronic lower back pain    Chronic pancreatitis (HCC)    Cocaine abuse (HCC)    Depression    Family history of adverse reaction to anesthesia    Femoral condyle fracture (HCC) 03/08/2014   left medial/notes 03/09/2014   GERD (gastroesophageal reflux disease)    H/O hiatal hernia    H/O suicide attempt 10/2012   High cholesterol    History of blood transfusion 10/2012   when I tried to commit suicide   History of stomach ulcers    Hypertension    Marijuana abuse, continuous    Migraine    a few times/year (03/26/2015)   Pancreatitis    Pneumonia 1990's X 3   PTSD (post-traumatic stress disorder)    Seizures (HCC)    Sickle cell trait    WPW (Wolff-Parkinson-White syndrome)    thelbert 03/06/2013      Dispostion: Disposition decision including need for hospitalization was considered, and patient discharged from emergency department.    Final Clinical Impression(s) / ED Diagnoses Final diagnoses:  Atypical chest pain  Alcohol -induced chronic pancreatitis (HCC)     This chart was dictated using voice recognition software.  Despite best efforts to proofread,  errors can occur which can change the documentation meaning.      [1]  Social History Tobacco Use   Smoking status: Every Day    Current packs/day: 1.00     Average packs/day: 1 pack/day for 36.0 years (36.0 ttl pk-yrs)    Types: Cigarettes   Smokeless tobacco: Never  Vaping Use   Vaping status: Every Day   Substances: Nicotine , Flavoring  Substance Use Topics   Alcohol  use: Yes    Comment: 01/07/24 4oz beer   Drug use: Yes    Types: Marijuana, Cocaine    Comment: Last used cocaine 01/04/24     Francesca Elsie CROME, MD 05/05/24 2336  "

## 2024-05-14 ENCOUNTER — Emergency Department (HOSPITAL_COMMUNITY): Payer: MEDICAID

## 2024-05-14 ENCOUNTER — Other Ambulatory Visit: Payer: Self-pay

## 2024-05-14 ENCOUNTER — Encounter (HOSPITAL_COMMUNITY): Payer: Self-pay

## 2024-05-14 ENCOUNTER — Emergency Department (HOSPITAL_COMMUNITY)
Admission: EM | Admit: 2024-05-14 | Discharge: 2024-05-15 | Disposition: A | Discharge status: Home / Self Care | Payer: MEDICAID | Attending: Emergency Medicine | Admitting: Emergency Medicine

## 2024-05-14 DIAGNOSIS — R197 Diarrhea, unspecified: Secondary | ICD-10-CM | POA: Insufficient documentation

## 2024-05-14 DIAGNOSIS — R112 Nausea with vomiting, unspecified: Secondary | ICD-10-CM | POA: Diagnosis not present

## 2024-05-14 DIAGNOSIS — Z7901 Long term (current) use of anticoagulants: Secondary | ICD-10-CM | POA: Insufficient documentation

## 2024-05-14 DIAGNOSIS — I4891 Unspecified atrial fibrillation: Secondary | ICD-10-CM | POA: Insufficient documentation

## 2024-05-14 DIAGNOSIS — Z91040 Latex allergy status: Secondary | ICD-10-CM | POA: Diagnosis not present

## 2024-05-14 DIAGNOSIS — G8929 Other chronic pain: Secondary | ICD-10-CM | POA: Insufficient documentation

## 2024-05-14 DIAGNOSIS — R1084 Generalized abdominal pain: Secondary | ICD-10-CM | POA: Insufficient documentation

## 2024-05-14 DIAGNOSIS — R109 Unspecified abdominal pain: Secondary | ICD-10-CM | POA: Diagnosis present

## 2024-05-14 LAB — COMPREHENSIVE METABOLIC PANEL WITH GFR
ALT: 39 U/L (ref 0–44)
AST: 66 U/L — ABNORMAL HIGH (ref 15–41)
Albumin: 4.1 g/dL (ref 3.5–5.0)
Alkaline Phosphatase: 94 U/L (ref 38–126)
Anion gap: 17 — ABNORMAL HIGH (ref 5–15)
BUN: 15 mg/dL (ref 6–20)
CO2: 12 mmol/L — ABNORMAL LOW (ref 22–32)
Calcium: 8.1 mg/dL — ABNORMAL LOW (ref 8.9–10.3)
Chloride: 106 mmol/L (ref 98–111)
Creatinine, Ser: 0.78 mg/dL (ref 0.61–1.24)
GFR, Estimated: >60 mL/min
Glucose, Bld: 86 mg/dL (ref 70–99)
Potassium: 5.2 mmol/L — ABNORMAL HIGH (ref 3.5–5.1)
Sodium: 135 mmol/L (ref 135–145)
Total Bilirubin: 0.4 mg/dL (ref 0.0–1.2)
Total Protein: 7.9 g/dL (ref 6.5–8.1)

## 2024-05-14 LAB — TROPONIN T, HIGH SENSITIVITY
Troponin T High Sensitivity: <15 ng/L (ref 0–19)
Troponin T High Sensitivity: <15 ng/L (ref 0–19)

## 2024-05-14 LAB — CBC
HCT: 42.4 % (ref 39.0–52.0)
Hemoglobin: 13.3 g/dL (ref 13.0–17.0)
MCH: 28.4 pg (ref 26.0–34.0)
MCHC: 31.4 g/dL (ref 30.0–36.0)
MCV: 90.6 fL (ref 80.0–100.0)
Platelets: 229 K/uL (ref 150–400)
RBC: 4.68 MIL/uL (ref 4.22–5.81)
RDW: 17.1 % — ABNORMAL HIGH (ref 11.5–15.5)
WBC: 7 K/uL (ref 4.0–10.5)
nRBC: 0 % (ref 0.0–0.2)

## 2024-05-14 LAB — URINE DRUG SCREEN
Amphetamines: NEGATIVE
Barbiturates: NEGATIVE
Benzodiazepines: NEGATIVE
Cocaine: POSITIVE — AB
Fentanyl: NEGATIVE
Methadone Scn, Ur: NEGATIVE
Opiates: POSITIVE — AB
Tetrahydrocannabinol: POSITIVE — AB

## 2024-05-14 LAB — LIPASE, BLOOD: Lipase: 19 U/L (ref 11–51)

## 2024-05-14 LAB — ETHANOL: Alcohol, Ethyl (B): <15 mg/dL

## 2024-05-14 MED ORDER — RIVAROXABAN 20 MG PO TABS: 20.0000 mg | ORAL_TABLET | Freq: Every day | ORAL | 0 refills | Status: AC

## 2024-05-14 MED ORDER — PANTOPRAZOLE SODIUM 40 MG PO TBEC: 40.0000 mg | DELAYED_RELEASE_TABLET | Freq: Every day | ORAL | 0 refills | Status: AC

## 2024-05-14 MED ORDER — DILTIAZEM HCL 25 MG/5ML IV SOLN
20.0000 mg | Freq: Once | INTRAVENOUS | Status: AC
  Administered 2024-05-14: 20 mg via INTRAVENOUS
  Filled 2024-05-14: qty 5

## 2024-05-14 MED ORDER — LIDOCAINE VISCOUS HCL 2 % MT SOLN: 15.0000 mL | OROMUCOSAL | 0 refills | Status: AC | PRN

## 2024-05-14 MED ORDER — PANTOPRAZOLE SODIUM 40 MG PO TBEC: 40.0000 mg | DELAYED_RELEASE_TABLET | Freq: Every day | ORAL | Status: DC

## 2024-05-14 MED ORDER — ACETAMINOPHEN 325 MG PO TABS
650.0000 mg | ORAL_TABLET | Freq: Once | ORAL | Status: AC
  Administered 2024-05-14: 650 mg via ORAL
  Filled 2024-05-14: qty 2

## 2024-05-14 MED ORDER — CARVEDILOL 12.5 MG PO TABS: 25.0000 mg | ORAL_TABLET | Freq: Two times a day (BID) | ORAL | Status: DC

## 2024-05-14 MED ORDER — RIVAROXABAN 10 MG PO TABS: 20.0000 mg | ORAL_TABLET | Freq: Every day | ORAL | Status: DC

## 2024-05-14 MED ORDER — MORPHINE SULFATE (PF) 4 MG/ML IV SOLN
4.0000 mg | Freq: Once | INTRAVENOUS | Status: AC
  Administered 2024-05-14: 4 mg via INTRAVENOUS
  Filled 2024-05-14: qty 1

## 2024-05-14 MED ORDER — ONDANSETRON 4 MG PO TBDP: 4.0000 mg | ORAL_TABLET | Freq: Three times a day (TID) | ORAL | 0 refills | Status: AC | PRN

## 2024-05-14 MED ORDER — CARVEDILOL 12.5 MG PO TABS
25.0000 mg | ORAL_TABLET | Freq: Two times a day (BID) | ORAL | Status: DC
  Administered 2024-05-15: 25 mg via ORAL
  Filled 2024-05-14: qty 2

## 2024-05-14 MED ORDER — ONDANSETRON HCL 4 MG/2ML IJ SOLN
4.0000 mg | Freq: Once | INTRAMUSCULAR | Status: AC
  Administered 2024-05-14: 4 mg via INTRAVENOUS
  Filled 2024-05-14: qty 2

## 2024-05-14 MED ORDER — CARVEDILOL 25 MG PO TABS: 25.0000 mg | ORAL_TABLET | Freq: Two times a day (BID) | ORAL | 0 refills | Status: AC

## 2024-05-14 MED ORDER — DILTIAZEM HCL 60 MG PO TABS
60.0000 mg | ORAL_TABLET | Freq: Once | ORAL | Status: AC
  Administered 2024-05-15: 60 mg via ORAL
  Filled 2024-05-14: qty 1

## 2024-05-14 MED ORDER — ONDANSETRON 4 MG PO TBDP: 4.0000 mg | ORAL_TABLET | Freq: Three times a day (TID) | ORAL | Status: DC | PRN

## 2024-05-14 NOTE — ED Triage Notes (Signed)
 Pt bib [facility] from home. C/o of nausea, vomiting, diarrhea, and chest pain. Chest pain started today and radiating to left upper stomach. No nitroglycerin  or Asprin. Axo 4. 12 lead unremarkable. Hx of afib.

## 2024-05-14 NOTE — ED Provider Notes (Signed)
 "  EMERGENCY DEPARTMENT AT [facility] Provider Note   CSN: [id number] Arrival date & time: 05/14/24  1813     Patient presents with: Chest Pain   [name] is a 54 y.o. male.    Chest Pain    Patient has a history of cocaine abuse, suicide attempt, acid reflux, alcoholism, migraines, bipolar disorder, PTSD, chronic pancreatitis.  Patient presents ED with complaints of nausea vomiting diarrhea and chest pain.  Patient states symptoms started today.  The pain is in his upper abdomen and radiates to his back.  He feels like his atrial fibrillation and pancreatitis are acting up.  Prior to Admission medications  Medication Sig Start Date End Date Taking? Authorizing Provider  acetaminophen  (TYLENOL ) 500 MG tablet Take 1,000 mg by mouth daily as needed for mild pain (pain score 1-3) or moderate pain (pain score 4-6).    [provider]  albuterol  (VENTOLIN  HFA) 108 (90 Base) MCG/ACT inhaler Inhale 2 puffs into the lungs every 6 (six) hours as needed for wheezing or shortness of breath. 04/03/24   [provider]  calcium  carbonate (TUMS - DOSED IN MG ELEMENTAL CALCIUM ) 500 MG chewable tablet Chew 2 tablets (400 mg of elemental calcium  total) by mouth 3 (three) times daily as needed for indigestion or heartburn. 03/01/24   Theophilus Pagan, MD  carvedilol  (COREG ) 25 MG tablet Take 1 tablet (25 mg total) by mouth 2 (two) times daily with a meal. 04/19/24   Vernon Ranks, MD  cyclobenzaprine  (FLEXERIL ) 10 MG tablet Take 1 tablet (10 mg total) by mouth 3 (three) times daily as needed for muscle spasms. 04/06/24   Kammerer, Megan L, DO  famotidine  (PEPCID ) 40 MG tablet Take 1 tablet (40 mg total) by mouth daily. Patient taking differently: Take 40 mg by mouth 2 (two) times daily. 10/17/23   Henderly, Britni A, PA-C  feeding supplement (ENSURE PLUS HIGH PROTEIN) LIQD Take 237 mLs by mouth 3 (three) times daily between meals. 03/01/24   Theophilus Pagan, MD  fluticasone  (FLONASE ) 50 MCG/ACT nasal spray Place 2 sprays into both nostrils daily. 04/03/24 04/03/25  [provider]  folic acid  (FOLVITE ) 1 MG tablet Take 1 tablet (1 mg total) by mouth daily. 03/01/24   Theophilus Pagan, MD  lipase/protease/amylase (CREON ) 12000-38000 units CPEP capsule Take 2 capsules (24,000 units of lipase total) by mouth Three (3) times a day with a meal. May also take 1 capsule (12,000 units of lipase total) with snacks (up to 3 snacks daily). 03/10/24   [provider]  loperamide  (IMODIUM  A-D) 2 MG tablet Take 2 mg by mouth 4 (four) times daily as needed for diarrhea or loose stools.    [provider]  magnesium  oxide (MAG-OX) 400 (240 Mg) MG tablet Take 1 tablet (400 mg total) by mouth daily. 11/07/23   Hoy Nidia FALCON, PA-C  Multiple Vitamin (MULTIVITAMIN WITH MINERALS) TABS tablet Take 1 tablet by mouth daily. 08/21/23   Ghimire, Donalda HERO, MD  naloxone  (NARCAN ) nasal spray 4 mg/0.1 mL Place 1 spray into the nose daily as needed (Opioid overdose). 03/01/24   Theophilus Pagan, MD  nicotine  (NICODERM CQ  - DOSED IN MG/24 HOURS) 21 mg/24hr patch Place 1 patch (21 mg total) onto the skin daily. Patient taking differently: Place 21 mg onto the skin daily as needed (for smoking cessation). 11/14/23   Amin, Ankit C, MD  nicotine  polacrilex (NICORETTE ) 2 MG gum Take 1 each (2 mg total) by mouth as needed  for smoking cessation. 03/01/24   Theophilus Pagan, MD  ondansetron  (ZOFRAN -ODT) 4 MG disintegrating tablet Take 1 tablet (4 mg total) by mouth every 8 (eight) hours as needed. 03/26/24   Long, Fonda MATSU, MD  Oxycodone  HCl 10 MG TABS Take 10 mg by mouth 4 (four) times daily as needed. 04/03/24   [provider]  pantoprazole  (PROTONIX ) 40 MG tablet Take 1 tablet (40 mg total) by mouth daily. 01/18/24   Kehrli, Kelsey F, PA-C  polyethylene glycol powder (GLYCOLAX /MIRALAX ) 17 GM/SCOOP powder Take 17 g by mouth daily as needed for mild  constipation. 11/13/23   Amin, Ankit C, MD  promethazine  (PHENERGAN ) 25 MG tablet Take 1 tablet (25 mg total) by mouth every 8 (eight) hours as needed for up to 4 days for nausea or vomiting. 04/06/24 04/16/25  Kammerer, Megan L, DO  rivaroxaban  (XARELTO ) 20 MG TABS tablet Take 1 tablet (20 mg total) by mouth daily with supper. 04/19/24 05/19/24  Vernon Ranks, MD  senna-docusate (SENOKOT-S) 8.6-50 MG tablet Take 1 tablet by mouth at bedtime as needed for mild constipation. Patient not taking: Reported on 04/16/2024 03/26/24   Long, Fonda MATSU, MD  sucralfate  (CARAFATE ) 1 GM/10ML suspension Take 1 g by mouth 4 (four) times daily -  with meals and at bedtime.    [provider]  tamsulosin  (FLOMAX ) 0.4 MG CAPS capsule Take 1 capsule (0.4 mg total) by mouth daily. 04/19/24 05/19/24  Vernon Ranks, MD  thiamine  (VITAMIN B-1) 100 MG tablet Take 1 tablet (100 mg total) by mouth daily. 03/01/24   Theophilus Pagan, MD  amitriptyline  (ELAVIL ) 25 MG tablet Take 1 tablet (25 mg total) by mouth at bedtime. Patient not taking: Reported on 08/08/2019 10/15/18 08/08/19  Tobie Yetta HERO, MD    Allergies: Robaxin  [methocarbamol ], Bayer aspirin  [aspirin ], Trazodone  and nefazodone, Adhesive [tape], Latex, Toradol  [ketorolac  tromethamine ], Contrast media [iodinated contrast media], Fish allergy, Reglan  [metoclopramide ], Salmon [fish oil], and Shellfish allergy    Review of Systems  Cardiovascular:  Positive for chest pain.    Updated Vital Signs BP (!) 176/116   Pulse (!) 102   Temp 98.3 F (36.8 C) (Oral)   Resp 16   Ht 1.753 m (5' 9)   Wt 59 kg   SpO2 99%   BMI 19.20 kg/m   Physical Exam Vitals and nursing note reviewed.  Constitutional:      General: He is not in acute distress.    Appearance: He is ill-appearing.  HENT:     Head: Normocephalic and atraumatic.     Right Ear: External ear normal.     Left Ear: External ear normal.  Eyes:     General: No scleral icterus.       Right eye: No  discharge.        Left eye: No discharge.     Conjunctiva/sclera: Conjunctivae normal.  Neck:     Trachea: No tracheal deviation.  Cardiovascular:     Rate and Rhythm: Tachycardia present. Rhythm irregular.  Pulmonary:     Effort: Pulmonary effort is normal. No respiratory distress.     Breath sounds: Normal breath sounds. No stridor. No wheezing or rales.  Abdominal:     General: Bowel sounds are normal. There is no distension.     Palpations: Abdomen is soft.     Tenderness: There is abdominal tenderness. There is no guarding or rebound.     Comments: Generalized  Musculoskeletal:        General: No tenderness or  deformity.     Cervical back: Neck supple.  Skin:    General: Skin is warm and dry.     Findings: No rash.  Neurological:     General: No focal deficit present.     Mental Status: He is alert.     Cranial Nerves: No cranial nerve deficit, dysarthria or facial asymmetry.     Sensory: No sensory deficit.     Motor: No abnormal muscle tone or seizure activity.     Coordination: Coordination normal.  Psychiatric:        Mood and Affect: Mood normal.     (all labs ordered are listed, but only abnormal results are displayed) Labs Reviewed  CBC - Abnormal; Notable for the following components:      Result Value   RDW 17.1 (*)    All other components within normal limits  COMPREHENSIVE METABOLIC PANEL WITH GFR - Abnormal; Notable for the following components:   Potassium 5.2 (*)    CO2 12 (*)    Calcium  8.1 (*)    AST 66 (*)    Anion gap 17 (*)    All other components within normal limits  URINE DRUG SCREEN - Abnormal; Notable for the following components:   Opiates POSITIVE (*)    Cocaine POSITIVE (*)    Tetrahydrocannabinol POSITIVE (*)    All other components within normal limits  LIPASE, BLOOD  ETHANOL  TROPONIN T, HIGH SENSITIVITY  TROPONIN T, HIGH SENSITIVITY    EKG: EKG Interpretation Date/Time:  Tuesday May 14 2024 18:52:46 EST Ventricular  Rate:  135 PR Interval:  100 QRS Duration:  68 QT Interval:  280 QTC Calculation: 420 R Axis:   47  Text Interpretation: Sinus tachycardia with short PR Right atrial enlargement ST & T wave abnormality, consider lateral ischemia Abnormal ECG When compared with ECG of 05-May-2024 10:48, No significant change since last tracing Confirmed by Randol Simmonds [id number]) on 05/14/2024 11:06:25 PM  Radiology: [facility] Chest 2 View Result Date: 05/14/2024 CLINICAL DATA:  Chest pain with nausea, vomiting and diarrhea. EXAM: CHEST - 2 VIEW COMPARISON:  May 05, 2024 FINDINGS: The heart size and mediastinal contours are within normal limits. Both lungs are clear. The visualized skeletal structures are unremarkable. IMPRESSION: No active cardiopulmonary disease. Electronically Signed   By: Suzen Dials M.D.   On: 05/14/2024 20:17     Procedures   Medications Ordered in the ED  morphine  (PF) 4 MG/ML injection 4 mg (has no administration in time range)  rivaroxaban  (XARELTO ) tablet 20 mg (has no administration in time range)  ondansetron  (ZOFRAN -ODT) disintegrating tablet 4 mg (has no administration in time range)  pantoprazole  (PROTONIX ) EC tablet 40 mg (has no administration in time range)  carvedilol  (COREG ) tablet 25 mg (has no administration in time range)  diltiazem  (CARDIZEM ) tablet 60 mg (has no administration in time range)  acetaminophen  (TYLENOL ) tablet 650 mg (650 mg Oral Given 05/14/24 1845)  diltiazem  (CARDIZEM ) injection 20 mg (20 mg Intravenous Given 05/14/24 1938)  morphine  (PF) 4 MG/ML injection 4 mg (4 mg Intravenous Given 05/14/24 1938)  ondansetron  (ZOFRAN ) injection 4 mg (4 mg Intravenous Given 05/14/24 1938)    Clinical Course as of 05/14/24 2308  Tue May 14, 2024  2005 Heart rate decreased into the 100s.  CBC normal [JK]  2150 Troponin T, High Sensitivity Troponin normal [JK]  2227 Urine Drug Screen(!) UDS positive for THC cocaine opiates [JK]  2227 Ethanol Ethanol level negative [JK]   2305 Heart  rate has improved into the low 100s.  On the cardiac monitor appears to be sinus tachycardia [JK]  2307 DG Chest 2 View No signs of pneumonia [JK]    Clinical Course User Index [JK] Randol Simmonds, MD                                 Medical Decision Making Amount and/or Complexity of Data Reviewed Labs: ordered. Decision-making details documented in ED Course. Radiology: ordered.  Risk OTC drugs. Prescription drug management.   Patient presented to the ED with complaints of abdominal pain.  Patient has a history of chronic recurrent episodes of abdominal pain.  He also has history of substance abuse.  Patient noted to be tachycardic on arrival.  He is positive for opiates cocaine and THC.  Patient is prescribed oxycodone  though.  Patient's labs do not show any signs of anemia.  No significant electrolyte abnormalities.  His cardiac enzymes normal.  Labs do not show signs of hepatitis or pancreatitis.  Patient was treated with pain medications.  He had some improvement in his symptoms.  I do not feel that abdominal injuring is necessary at this time considering his history of similar episodes in the past.  Patient did ask for refills of some of his medications.  I did tell him that he will need to speak to his primary care doctor about his refill of oxycodone .  Evaluation and diagnostic testing in the emergency department does not suggest an emergent condition requiring admission or immediate intervention beyond what has been performed at this time.  The patient is safe for discharge and has been instructed to return immediately for worsening symptoms, change in symptoms or any other concerns.     Final diagnoses:  Chronic abdominal pain    ED Discharge Orders     None          Randol Simmonds, MD 05/14/24 2312  "

## 2024-05-14 NOTE — ED Notes (Signed)
 RN informed Dr.Knapp pt BP is elevated and awaiting Cardizem  medication from pharmacy.

## 2024-05-14 NOTE — ED Notes (Signed)
 Patient transported to X-ray

## 2024-05-14 NOTE — ED Notes (Signed)
 PT unhooked himself and ambulated to the bathroom

## 2024-05-14 NOTE — ED Notes (Signed)
 PT is watching tv and making phone calls at this time

## 2024-05-14 NOTE — Discharge Instructions (Addendum)
 Avoid using cocaine.  Continue to take your medications for your atrial fibrillation.  Follow-up with your primary care doctor to be rechecked

## 2024-05-14 NOTE — ED Notes (Signed)
 PT found laying on bathroom floor rolling around in pain by EMS techs. EMS techs placed PT in wheelchair and rolled him back to his room. PT then asked for a cup of ice.

## 2024-05-14 NOTE — ED Notes (Signed)
 PT unhooked himself to ambulate to the bathroom.

## 2024-05-14 NOTE — ED Notes (Signed)
 PT unhooked himself and went to the bathroom

## 2024-05-14 NOTE — ED Notes (Signed)
 Pt denies headache and blurred vision at this time.  RN discussed prescriptions orders and pt verbalized understanding.

## 2024-05-14 NOTE — ED Notes (Signed)
 Pt was given tylenol  and water , pt took tylenol  and then spit water  and tylenol  into an emesis bag.

## 2024-05-14 NOTE — ED Provider Triage Note (Signed)
 Emergency Medicine Provider Triage Evaluation Note  [name] , a 54 y.o. male  was evaluated in triage.  Patient is well-known to the ED.  He said 25 visits in the last 6 months.  Pt complains of pain in his chest as well as his upper abdomen.  Patient feels like his atrial fibrillation and pancreatitis are acting up.  Review of Systems  Positive: Abd pain   Negative: fever  Physical Exam  BP (!) 126/104 (BP Location: Left Arm)   Pulse (!) 140   Temp 98.6 F (37 C) (Oral)   Resp 16   Ht 1.753 m (5' 9)   Wt 59 kg   SpO2 100%   BMI 19.20 kg/m  Gen:   Awake, appears to be in pain Resp:  Normal effort  MSK:   Moves extremities without difficulty  Other:    Medical Decision Making  Medically screening exam initiated at 6:36 PM.  Appropriate orders placed.  [name] was informed that the remainder of the evaluation will be completed by another provider, this initial triage assessment does not replace that evaluation, and the importance of remaining in the ED until their evaluation is complete.     Randol Simmonds, MD 05/14/24 [phone]

## 2024-05-15 NOTE — ED Notes (Signed)
 RN called transportation for pt using his insurance card. The [employer] driver will be at the hospital around 03:25 am

## 2024-05-15 NOTE — ED Notes (Signed)
 Pt provided insurance card to contact for transportation

## 2024-05-26 NOTE — Progress Notes (Unsigned)
 "  Cardiology Office Note    Date:  05/26/2024  ID:  [name], DOB 1970/08/11, MRN [id number] PCP:  Myra Wanda LABOR, MD  Cardiologist:  Lynwood Schilling, MD  Electrophysiologist:  None   Chief Complaint: ***  History of Present Illness: .    [name] is a 54 y.o. male with visit-pertinent history of of ? WPW s/p ablation per patient report, substance abuse (ETOH, cocaine, marijuana), alcohol  abuse, alcoholic pancreatitis, chronic abdominal pain, paroxysmal atrial fibrillation, seizure-like activity, bipolar disorder, HTN, PTSD, anemia, hypomagnesemia, GERD, stomach ulcers, HLD.    [name] had remote cardiac catheterization in 2014 with no coronary disease. 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 Nuclear stress test in 2023 showed no reversible ischemia, considered intermediate risk due to EF 47% however follow-up echo 02/2022 showed EF 65-70%. Per notes patient also has history of paroxysmal atrial fibrillation however has not been started anticoagulation due to low CHA2DS2-VASc and ongoing alcohol  use.    Patient has had over 40 hospital encounters in 2025 this for for variety of issues including chronic pancreatitis, chronic abdominal pain, paroxysmal atrial fibrillation, episodic mildly elevated troponin levels (other times negative), seizure-like activity, syncope/collapse and various other medical ailments, at times care complicated by belligerence and disruptive behavior.  Inpatient therapies for his A-fib have included IV amiodarone , IV procainamide , IV magnesium  and diltiazem .  Patient last received IV procainamide  in July 2025.  Per notes case was previously discussed with the EP who felt there was no contraindication to  beta-blocker, carvedilol  has previously been chosen over metoprolol  due to cocaine use.  Echo in 11/2023 showed EF 70 to 75%, G1 DD, mildly elevated PASP, no significant change from prior.  Event monitor in 11/2023 showed normal sinus rhythm, average 93 bpm, 1 7 beat run of NSVT, 25 short episodes of PSVT, longest 7 beats, 1.7% PACs, rare PVCs, no A-fib or sustained arrhythmias.   Cardiology was consulted during admission in 02/2024 after he returned to the ED with complaints of tachycardia for the prior several hours associated with chest discomfort that started after drinking Gatorade.  Per notes heart rate was in the 180s by EMS (AF by strips), given 20 mg of Cardizem , 4 mg Zofran , 4 mg morphine  with improvement in heart rate to the 140s.  EKG showed A-fib with RVR at 118 bpm on arrival though subsequent heart rate was variable from 140s to 190s (afib with episodic WCT felt abberancy).  He also reported having nausea, vomiting, diarrhea as well as productive cough.  Patient reported adherence with carvedilol .  Lab work showed hypokalemia of 3.4, hypocalcemia of 7.3, AST 80, ALT within normal limits, creatinine within normal limits, high sensitive bones were negative, hemoglobin 11.5.  Patient was started on procainamide  in the ED however Dr. Waddell reviewed EKGs and did not feel recent tracing showed preexcitation and recommended discontinuation.  Patient reported that he had used alcohol , cocaine and THC recently.  Patient underwent nuclear stress test on 02/28/2024 in setting of chest pain, study was normal and low risk, LV function was normal.  Patient converted to normal sinus rhythm while inpatient was continued on Coreg  25 mg twice daily and diltiazem  240 mg daily.  Patient was discharged on 03/01/2024   On chart review patient was  admitted to [facility] on 03/08/2024 with acute abdominal pain and chest pain with shortness of breath.  Patient then reported that chest pain was more epigastric, troponins  were negative.  Patient was last seen by cardiology service in 04/2024, was again in atrial fibrillation, reported he had used cocaine 5 days prior.  Patient reported he had run out of diltiazem  and carvedilol , had been taking old metoprolol  at home.  Patient converted to normal sinus rhythm, was started on Xarelto  20 mg at bedtime.  Patient was last seen at [facility], ED on 05/14/2024 with complaints of abdominal pain.  Patient was noted to be tachycardic on arrival, but was positive for opiates, cocaine and THC.  Cardiac enzymes were normal.   Today he presents for follow-up.  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 Continue Polysubstance abuse with alcohol  abuse, cocaine use and THC use Chronic abdominal pain, nausea, vomiting with chronic pancreatitis:    Labwork independently reviewed:   ROS: .   *** denies chest pain, shortness of breath, lower extremity edema, fatigue, palpitations, melena, hematuria, hemoptysis, diaphoresis, weakness, presyncope, syncope, orthopnea, and PND.  All other systems are reviewed and otherwise negative.  Studies Reviewed: SABRA    EKG:  EKG is ordered today, personally reviewed, demonstrating ***     CV Studies: Cardiac studies reviewed are outlined and summarized above. Otherwise please see EMR for full report. Cardiac Studies & Procedures   ______________________________________________________________________________________________   STRESS TESTS  NM MYOCAR MULTI W/SPECT W 02/28/2024  Narrative   The study is normal. The study is low risk.   No ST deviation was noted.   LV perfusion is normal.   Left ventricular function is normal. End diastolic cavity size is normal. End systolic cavity  size is normal.   Prior study available for comparison. There are changes compared to prior study. The left ventricular ejection fraction has normalized.   Electronically signed by Jerel Balding, MD   ECHOCARDIOGRAM  ECHOCARDIOGRAM COMPLETE 04/17/2024  Narrative ECHOCARDIOGRAM REPORT    Patient Name:   [name] Date of Exam: 04/17/2024 Medical Rec #:  [id number]              Height:       68.0 in Accession #:    [id number]             Weight:       118.0 lb Date of Birth:  [birth year]               BSA:          1.633 m Patient Age:    53 years               BP:           92/68 mmHg Patient Gender: M                      HR:           87 bpm. Exam Location:  Inpatient  Procedure: 2D Echo (Both Spectral and Color Flow Doppler were utilized during procedure).  Indications:    CP  History:        Patient has prior history of Echocardiogram examinations. Signs/Symptoms:Chest Pain.  Sonographer:  Norleen Amour Referring Phys: BLEASE DOUTOVA  IMPRESSIONS   1. Left ventricular ejection fraction, by estimation, is 70 to 75%. The left ventricle has hyperdynamic function. The left ventricle has no regional wall motion abnormalities. Indeterminate diastolic filling due to E-A fusion. 2. Right ventricular systolic function is normal. The right ventricular size is normal. There is normal pulmonary artery systolic pressure. The estimated right ventricular systolic pressure is 26.0 mmHg. 3. The mitral valve is normal in structure. No evidence of mitral valve regurgitation. No evidence of mitral stenosis. 4. The aortic valve is tricuspid. Aortic valve regurgitation is not visualized. No aortic stenosis is present. 5. The inferior vena cava is normal in size with greater than 50% respiratory variability, suggesting right atrial pressure of 3 mmHg.  Comparison(s): No significant change from prior study. Prior images reviewed side by side.  FINDINGS Left Ventricle: Left  ventricular ejection fraction, by estimation, is 70 to 75%. The left ventricle has hyperdynamic function. The left ventricle has no regional wall motion abnormalities. The left ventricular internal cavity size was normal in size. There is no left ventricular hypertrophy. Indeterminate diastolic filling due to E-A fusion.  Right Ventricle: The right ventricular size is normal. No increase in right ventricular wall thickness. Right ventricular systolic function is normal. There is normal pulmonary artery systolic pressure. The tricuspid regurgitant velocity is 2.40 m/s, and with an assumed right atrial pressure of 3 mmHg, the estimated right ventricular systolic pressure is 26.0 mmHg.  Left Atrium: Left atrial size was normal in size.  Right Atrium: Right atrial size was normal in size.  Pericardium: There is no evidence of pericardial effusion.  Mitral Valve: The mitral valve is normal in structure. No evidence of mitral valve regurgitation. No evidence of mitral valve stenosis. MV peak gradient, 3.2 mmHg. The mean mitral valve gradient is 1.0 mmHg.  Tricuspid Valve: The tricuspid valve is normal in structure. Tricuspid valve regurgitation is trivial. No evidence of tricuspid stenosis.  Aortic Valve: The aortic valve is tricuspid. Aortic valve regurgitation is not visualized. No aortic stenosis is present. Aortic valve mean gradient measures 3.0 mmHg. Aortic valve peak gradient measures 6.2 mmHg. Aortic valve area, by VTI measures 2.75 cm.  Pulmonic Valve: The pulmonic valve was normal in structure. Pulmonic valve regurgitation is not visualized. No evidence of pulmonic stenosis.  Aorta: The aortic root is normal in size and structure.  Venous: The inferior vena cava is normal in size with greater than 50% respiratory variability, suggesting right atrial pressure of 3 mmHg.  IAS/Shunts: No atrial level shunt detected by color flow Doppler.   LEFT VENTRICLE PLAX 2D LVIDd:         4.20 cm      Diastology LVIDs:         2.90 cm     LV e' medial:    10.83 cm/s LV PW:         1.00 cm     LV E/e' medial:  9.8 LV IVS:        0.70 cm     LV e' lateral:   11.00 cm/s LVOT diam:     1.90 cm     LV E/e' lateral: 9.6 LV SV:         46 LV SV Index:   28 LVOT Area:     2.84 cm  LV Volumes (MOD) LV vol d, MOD A2C: 33.3 ml LV vol d, MOD A4C: 33.9 ml LV vol s, MOD A2C: 16.6 ml LV vol  s, MOD A4C: 13.4 ml LV SV MOD A2C:     16.7 ml LV SV MOD A4C:     33.9 ml LV SV MOD BP:      17.7 ml  RIGHT VENTRICLE            IVC RV Basal diam:  2.80 cm    IVC diam: 1.40 cm RV S prime:     8.16 cm/s TAPSE (M-mode): 1.5 cm  LEFT ATRIUM           Index        RIGHT ATRIUM           Index LA diam:      3.10 cm 1.90 cm/m   RA Area:     11.00 cm LA Vol (A2C): 16.2 ml 9.92 ml/m   RA Volume:   24.50 ml  15.00 ml/m LA Vol (A4C): 26.0 ml 15.92 ml/m AORTIC VALVE                    PULMONIC VALVE AV Area (Vmax):    2.08 cm     PV Vmax:       1.07 m/s AV Area (Vmean):   2.12 cm     PV Peak grad:  4.6 mmHg AV Area (VTI):     2.75 cm AV Vmax:           125.00 cm/s AV Vmean:          83.800 cm/s AV VTI:            0.168 m AV Peak Grad:      6.2 mmHg AV Mean Grad:      3.0 mmHg LVOT Vmax:         91.80 cm/s LVOT Vmean:        62.600 cm/s LVOT VTI:          0.163 m LVOT/AV VTI ratio: 0.97  AORTA Ao Root diam: 2.60 cm Ao Asc diam:  2.40 cm  MITRAL VALVE                TRICUSPID VALVE MV Area VTI:  2.69 cm      TR Peak grad:   23.0 mmHg MV Peak grad: 3.2 mmHg      TR Vmax:        240.00 cm/s MV Mean grad: 1.0 mmHg MV Vmax:      0.90 m/s      SHUNTS MV Vmean:     45.3 cm/s     Systemic VTI:  0.16 m MV E velocity: 105.67 cm/s  Systemic Diam: 1.90 cm  Jason Parchment MD Electronically signed by Jason Parchment MD Signature Date/Time: 04/17/2024/11:37:06 AM    Final    MONITORS  LONG TERM MONITOR-LIVE TELEMETRY (3-14 DAYS) 12/12/2023  Narrative Rhythm was normal sinus. There were  occasional runs of supraventricular tachycardia with the longest lasting 7 beats. There was some wide-complex tachycardia which could have been SVT aberrancy.  However, could not exclude nonsustained ventricular tachycardia lasting 7 beats.       ______________________________________________________________________________________________       Current Reported Medications:.    Active Medications[1]  Physical Exam:    VS:  There were no vitals taken for this visit.   Wt Readings from Last 3 Encounters:  05/14/24 130 lb (59 kg)  04/28/24 126 lb (57.2 kg)  04/16/24 118 lb (53.5 kg)    GEN: Well nourished, well developed in no acute distress NECK: No JVD; No  carotid bruits CARDIAC: ***RRR, no murmurs, rubs, gallops RESPIRATORY:  Clear to auscultation without rales, wheezing or rhonchi  ABDOMEN: Soft, non-tender, non-distended EXTREMITIES:  No edema; No acute deformity     Asessement and Plan:.     ***     Disposition: F/u with ***  Signed, Marget Outten D Rhina Kramme, NP      [1]  No outpatient medications have been marked as taking for the 05/27/24 encounter (Appointment) with Anjelo Pullman D, NP.   "

## 2024-05-27 ENCOUNTER — Ambulatory Visit: Payer: MEDICAID | Attending: Cardiology | Admitting: Cardiology

## 2024-05-27 DIAGNOSIS — I480 Paroxysmal atrial fibrillation: Secondary | ICD-10-CM

## 2024-05-27 DIAGNOSIS — I10 Essential (primary) hypertension: Secondary | ICD-10-CM
